# Patient Record
Sex: Female | Born: 1960 | Race: Black or African American | Hispanic: No | Smoking: Current every day smoker
Health system: Southern US, Community
[De-identification: ages and names within clinical notes are randomized; demographics above are authoritative.]

## PROBLEM LIST (undated history)

## (undated) DIAGNOSIS — I511 Rupture of chordae tendineae, not elsewhere classified: Secondary | ICD-10-CM

## (undated) DIAGNOSIS — R42 Dizziness and giddiness: Secondary | ICD-10-CM

## (undated) DIAGNOSIS — E119 Type 2 diabetes mellitus without complications: Secondary | ICD-10-CM

## (undated) DIAGNOSIS — H269 Unspecified cataract: Secondary | ICD-10-CM

## (undated) DIAGNOSIS — I10 Essential (primary) hypertension: Secondary | ICD-10-CM

## (undated) DIAGNOSIS — I209 Angina pectoris, unspecified: Secondary | ICD-10-CM

## (undated) DIAGNOSIS — I509 Heart failure, unspecified: Secondary | ICD-10-CM

## (undated) DIAGNOSIS — F32A Depression, unspecified: Secondary | ICD-10-CM

## (undated) DIAGNOSIS — I739 Peripheral vascular disease, unspecified: Secondary | ICD-10-CM

## (undated) DIAGNOSIS — Z9109 Other allergy status, other than to drugs and biological substances: Secondary | ICD-10-CM

## (undated) DIAGNOSIS — S88119A Complete traumatic amputation at level between knee and ankle, unspecified lower leg, initial encounter: Secondary | ICD-10-CM

## (undated) DIAGNOSIS — K635 Polyp of colon: Secondary | ICD-10-CM

## (undated) DIAGNOSIS — M0800 Unspecified juvenile rheumatoid arthritis of unspecified site: Secondary | ICD-10-CM

## (undated) DIAGNOSIS — G8929 Other chronic pain: Secondary | ICD-10-CM

## (undated) DIAGNOSIS — J189 Pneumonia, unspecified organism: Secondary | ICD-10-CM

## (undated) DIAGNOSIS — E785 Hyperlipidemia, unspecified: Secondary | ICD-10-CM

## (undated) DIAGNOSIS — M545 Other chronic pain: Secondary | ICD-10-CM

## (undated) DIAGNOSIS — M4800 Spinal stenosis, site unspecified: Secondary | ICD-10-CM

## (undated) DIAGNOSIS — J42 Unspecified chronic bronchitis: Secondary | ICD-10-CM

## (undated) DIAGNOSIS — E1142 Type 2 diabetes mellitus with diabetic polyneuropathy: Secondary | ICD-10-CM

## (undated) DIAGNOSIS — J449 Chronic obstructive pulmonary disease, unspecified: Secondary | ICD-10-CM

## (undated) DIAGNOSIS — M069 Rheumatoid arthritis, unspecified: Secondary | ICD-10-CM

## (undated) DIAGNOSIS — I330 Acute and subacute infective endocarditis: Secondary | ICD-10-CM

## (undated) DIAGNOSIS — J45909 Unspecified asthma, uncomplicated: Secondary | ICD-10-CM

## (undated) DIAGNOSIS — R0602 Shortness of breath: Secondary | ICD-10-CM

## (undated) DIAGNOSIS — K219 Gastro-esophageal reflux disease without esophagitis: Secondary | ICD-10-CM

## (undated) DIAGNOSIS — Z720 Tobacco use: Secondary | ICD-10-CM

## (undated) DIAGNOSIS — K760 Fatty (change of) liver, not elsewhere classified: Secondary | ICD-10-CM

## (undated) DIAGNOSIS — N189 Chronic kidney disease, unspecified: Secondary | ICD-10-CM

## (undated) DIAGNOSIS — R0600 Dyspnea, unspecified: Secondary | ICD-10-CM

## (undated) DIAGNOSIS — R519 Headache, unspecified: Secondary | ICD-10-CM

## (undated) DIAGNOSIS — M797 Fibromyalgia: Secondary | ICD-10-CM

## (undated) DIAGNOSIS — E1159 Type 2 diabetes mellitus with other circulatory complications: Secondary | ICD-10-CM

## (undated) DIAGNOSIS — Z9289 Personal history of other medical treatment: Secondary | ICD-10-CM

## (undated) DIAGNOSIS — G894 Chronic pain syndrome: Secondary | ICD-10-CM

## (undated) DIAGNOSIS — I82629 Acute embolism and thrombosis of deep veins of unspecified upper extremity: Secondary | ICD-10-CM

## (undated) DIAGNOSIS — I5032 Chronic diastolic (congestive) heart failure: Secondary | ICD-10-CM

## (undated) DIAGNOSIS — F329 Major depressive disorder, single episode, unspecified: Secondary | ICD-10-CM

## (undated) DIAGNOSIS — Z89519 Acquired absence of unspecified leg below knee: Secondary | ICD-10-CM

## (undated) DIAGNOSIS — A499 Bacterial infection, unspecified: Secondary | ICD-10-CM

## (undated) DIAGNOSIS — M47816 Spondylosis without myelopathy or radiculopathy, lumbar region: Secondary | ICD-10-CM

## (undated) DIAGNOSIS — L97509 Non-pressure chronic ulcer of other part of unspecified foot with unspecified severity: Secondary | ICD-10-CM

## (undated) DIAGNOSIS — M86679 Other chronic osteomyelitis, unspecified ankle and foot: Secondary | ICD-10-CM

## (undated) HISTORY — PX: TUBAL LIGATION: SHX77

## (undated) HISTORY — DX: Chronic obstructive pulmonary disease, unspecified: J44.9

## (undated) HISTORY — DX: Chronic pain syndrome: G89.4

## (undated) HISTORY — DX: Complete traumatic amputation at level between knee and ankle, unspecified lower leg, initial encounter: S88.119A

## (undated) HISTORY — PX: SHOULDER ARTHROSCOPY W/ ROTATOR CUFF REPAIR: SHX2400

## (undated) HISTORY — DX: Essential (primary) hypertension: I10

## (undated) HISTORY — PX: COLONOSCOPY: SHX174

## (undated) HISTORY — DX: Type 2 diabetes mellitus with other circulatory complications: E11.59

## (undated) HISTORY — DX: Acute and subacute infective endocarditis: I33.0

## (undated) HISTORY — DX: Gastro-esophageal reflux disease without esophagitis: K21.9

## (undated) HISTORY — DX: Acquired absence of unspecified leg below knee: Z89.519

## (undated) HISTORY — DX: Polyp of colon: K63.5

## (undated) HISTORY — PX: WRIST SURGERY: SHX841

## (undated) HISTORY — DX: Major depressive disorder, single episode, unspecified: F32.9

## (undated) HISTORY — DX: Unspecified juvenile rheumatoid arthritis of unspecified site: M08.00

## (undated) HISTORY — DX: Fibromyalgia: M79.7

## (undated) HISTORY — DX: Type 2 diabetes mellitus with diabetic polyneuropathy: E11.42

## (undated) HISTORY — DX: Chronic kidney disease, unspecified: N18.9

## (undated) HISTORY — PX: CATARACT EXTRACTION, BILATERAL: SHX1313

## (undated) HISTORY — DX: Peripheral vascular disease, unspecified: I73.9

## (undated) HISTORY — PX: GANGLION CYST EXCISION: SHX1691

## (undated) HISTORY — PX: BREAST BIOPSY: SHX20

## (undated) HISTORY — DX: Dizziness and giddiness: R42

## (undated) HISTORY — PX: TONSILLECTOMY: SUR1361

## (undated) HISTORY — DX: Hyperlipidemia, unspecified: E78.5

## (undated) HISTORY — PX: PERIPHERAL VASCULAR INTERVENTION: CATH118257

## (undated) HISTORY — DX: Depression, unspecified: F32.A

## (undated) HISTORY — PX: BLADDER SURGERY: SHX569

---

## 1983-12-23 HISTORY — PX: DILATION AND CURETTAGE OF UTERUS: SHX78

## 1995-12-23 HISTORY — PX: ABDOMINAL HYSTERECTOMY: SHX81

## 1998-07-30 ENCOUNTER — Encounter: Admission: RE | Admit: 1998-07-30 | Discharge: 1998-07-30 | Payer: Self-pay | Admitting: Internal Medicine

## 1998-08-22 ENCOUNTER — Encounter: Admission: RE | Admit: 1998-08-22 | Discharge: 1998-08-22 | Payer: Self-pay | Admitting: Internal Medicine

## 1998-12-03 ENCOUNTER — Emergency Department (HOSPITAL_COMMUNITY): Admission: EM | Admit: 1998-12-03 | Discharge: 1998-12-03 | Payer: Self-pay | Admitting: Emergency Medicine

## 1998-12-04 ENCOUNTER — Encounter: Payer: Self-pay | Admitting: Internal Medicine

## 1999-03-20 ENCOUNTER — Encounter: Admission: RE | Admit: 1999-03-20 | Discharge: 1999-03-20 | Payer: Self-pay | Admitting: Internal Medicine

## 1999-04-18 ENCOUNTER — Encounter: Admission: RE | Admit: 1999-04-18 | Discharge: 1999-04-18 | Payer: Self-pay | Admitting: Internal Medicine

## 1999-05-13 ENCOUNTER — Encounter: Admission: RE | Admit: 1999-05-13 | Discharge: 1999-05-13 | Payer: Self-pay | Admitting: Internal Medicine

## 1999-05-13 ENCOUNTER — Other Ambulatory Visit: Admission: RE | Admit: 1999-05-13 | Discharge: 1999-05-13 | Payer: Self-pay | Admitting: *Deleted

## 1999-11-07 ENCOUNTER — Encounter: Admission: RE | Admit: 1999-11-07 | Discharge: 1999-11-07 | Payer: Self-pay | Admitting: Internal Medicine

## 1999-12-03 ENCOUNTER — Encounter: Admission: RE | Admit: 1999-12-03 | Discharge: 1999-12-03 | Payer: Self-pay | Admitting: Internal Medicine

## 2000-03-31 ENCOUNTER — Encounter: Admission: RE | Admit: 2000-03-31 | Discharge: 2000-03-31 | Payer: Self-pay | Admitting: Hematology and Oncology

## 2000-04-14 ENCOUNTER — Ambulatory Visit (HOSPITAL_COMMUNITY): Admission: RE | Admit: 2000-04-14 | Discharge: 2000-04-14 | Payer: Self-pay | Admitting: *Deleted

## 2000-04-24 ENCOUNTER — Encounter: Admission: RE | Admit: 2000-04-24 | Discharge: 2000-04-24 | Payer: Self-pay | Admitting: *Deleted

## 2000-09-07 ENCOUNTER — Encounter: Admission: RE | Admit: 2000-09-07 | Discharge: 2000-09-07 | Payer: Self-pay | Admitting: Internal Medicine

## 2001-01-14 ENCOUNTER — Encounter: Admission: RE | Admit: 2001-01-14 | Discharge: 2001-01-14 | Payer: Self-pay | Admitting: Internal Medicine

## 2001-04-18 ENCOUNTER — Emergency Department (HOSPITAL_COMMUNITY): Admission: EM | Admit: 2001-04-18 | Discharge: 2001-04-18 | Payer: Self-pay | Admitting: Emergency Medicine

## 2001-08-17 ENCOUNTER — Ambulatory Visit (HOSPITAL_COMMUNITY): Admission: RE | Admit: 2001-08-17 | Discharge: 2001-08-17 | Payer: Self-pay

## 2001-08-17 ENCOUNTER — Encounter (INDEPENDENT_AMBULATORY_CARE_PROVIDER_SITE_OTHER): Payer: Self-pay | Admitting: Internal Medicine

## 2001-08-17 ENCOUNTER — Encounter: Admission: RE | Admit: 2001-08-17 | Discharge: 2001-08-17 | Payer: Self-pay | Admitting: Internal Medicine

## 2001-08-17 LAB — CONVERTED CEMR LAB: Pap Smear: NORMAL

## 2001-08-26 ENCOUNTER — Encounter: Admission: RE | Admit: 2001-08-26 | Discharge: 2001-11-24 | Payer: Self-pay

## 2001-12-22 DIAGNOSIS — K760 Fatty (change of) liver, not elsewhere classified: Secondary | ICD-10-CM

## 2001-12-22 HISTORY — DX: Fatty (change of) liver, not elsewhere classified: K76.0

## 2001-12-28 ENCOUNTER — Encounter: Admission: RE | Admit: 2001-12-28 | Discharge: 2001-12-28 | Payer: Self-pay

## 2002-09-04 ENCOUNTER — Emergency Department (HOSPITAL_COMMUNITY): Admission: EM | Admit: 2002-09-04 | Discharge: 2002-09-04 | Payer: Self-pay | Admitting: Emergency Medicine

## 2002-12-06 ENCOUNTER — Emergency Department (HOSPITAL_COMMUNITY): Admission: EM | Admit: 2002-12-06 | Discharge: 2002-12-06 | Payer: Self-pay | Admitting: Emergency Medicine

## 2002-12-07 ENCOUNTER — Ambulatory Visit (HOSPITAL_COMMUNITY): Admission: RE | Admit: 2002-12-07 | Discharge: 2002-12-07 | Payer: Self-pay | Admitting: *Deleted

## 2002-12-07 ENCOUNTER — Encounter: Payer: Self-pay | Admitting: *Deleted

## 2003-01-05 ENCOUNTER — Encounter: Admission: RE | Admit: 2003-01-05 | Discharge: 2003-01-05 | Payer: Self-pay | Admitting: Internal Medicine

## 2003-01-13 ENCOUNTER — Encounter: Payer: Self-pay | Admitting: Internal Medicine

## 2003-01-13 ENCOUNTER — Encounter: Admission: RE | Admit: 2003-01-13 | Discharge: 2003-01-13 | Payer: Self-pay | Admitting: Internal Medicine

## 2003-04-03 ENCOUNTER — Inpatient Hospital Stay (HOSPITAL_COMMUNITY): Admission: EM | Admit: 2003-04-03 | Discharge: 2003-04-05 | Payer: Self-pay | Admitting: Emergency Medicine

## 2003-04-03 ENCOUNTER — Encounter: Payer: Self-pay | Admitting: Emergency Medicine

## 2003-04-04 ENCOUNTER — Encounter: Payer: Self-pay | Admitting: Internal Medicine

## 2003-04-10 ENCOUNTER — Encounter: Admission: RE | Admit: 2003-04-10 | Discharge: 2003-04-10 | Payer: Self-pay | Admitting: Internal Medicine

## 2003-04-17 ENCOUNTER — Encounter: Admission: RE | Admit: 2003-04-17 | Discharge: 2003-04-17 | Payer: Self-pay | Admitting: Internal Medicine

## 2003-05-31 ENCOUNTER — Encounter: Admission: RE | Admit: 2003-05-31 | Discharge: 2003-05-31 | Payer: Self-pay | Admitting: Internal Medicine

## 2003-07-31 ENCOUNTER — Encounter: Admission: RE | Admit: 2003-07-31 | Discharge: 2003-07-31 | Payer: Self-pay | Admitting: Infectious Diseases

## 2003-07-31 ENCOUNTER — Ambulatory Visit (HOSPITAL_COMMUNITY): Admission: RE | Admit: 2003-07-31 | Discharge: 2003-07-31 | Payer: Self-pay | Admitting: Infectious Diseases

## 2003-10-06 ENCOUNTER — Encounter: Admission: RE | Admit: 2003-10-06 | Discharge: 2003-10-06 | Payer: Self-pay | Admitting: Internal Medicine

## 2004-01-18 ENCOUNTER — Emergency Department (HOSPITAL_COMMUNITY): Admission: EM | Admit: 2004-01-18 | Discharge: 2004-01-18 | Payer: Self-pay | Admitting: Emergency Medicine

## 2004-01-25 ENCOUNTER — Encounter: Admission: RE | Admit: 2004-01-25 | Discharge: 2004-01-25 | Payer: Self-pay | Admitting: Internal Medicine

## 2004-01-30 ENCOUNTER — Encounter: Admission: RE | Admit: 2004-01-30 | Discharge: 2004-01-30 | Payer: Self-pay | Admitting: Internal Medicine

## 2004-02-02 ENCOUNTER — Encounter: Admission: RE | Admit: 2004-02-02 | Discharge: 2004-02-02 | Payer: Self-pay | Admitting: Internal Medicine

## 2004-02-14 ENCOUNTER — Emergency Department (HOSPITAL_COMMUNITY): Admission: EM | Admit: 2004-02-14 | Discharge: 2004-02-14 | Payer: Self-pay | Admitting: Emergency Medicine

## 2004-04-16 ENCOUNTER — Emergency Department (HOSPITAL_COMMUNITY): Admission: EM | Admit: 2004-04-16 | Discharge: 2004-04-16 | Payer: Self-pay | Admitting: Emergency Medicine

## 2004-06-11 ENCOUNTER — Encounter: Admission: RE | Admit: 2004-06-11 | Discharge: 2004-06-11 | Payer: Self-pay | Admitting: Internal Medicine

## 2004-06-21 ENCOUNTER — Ambulatory Visit (HOSPITAL_COMMUNITY): Admission: RE | Admit: 2004-06-21 | Discharge: 2004-06-21 | Payer: Self-pay | Admitting: Internal Medicine

## 2004-06-22 ENCOUNTER — Ambulatory Visit (HOSPITAL_COMMUNITY): Admission: RE | Admit: 2004-06-22 | Discharge: 2004-06-22 | Payer: Self-pay | Admitting: Internal Medicine

## 2004-07-10 ENCOUNTER — Emergency Department (HOSPITAL_COMMUNITY): Admission: EM | Admit: 2004-07-10 | Discharge: 2004-07-10 | Payer: Self-pay | Admitting: Emergency Medicine

## 2004-07-22 ENCOUNTER — Encounter: Admission: RE | Admit: 2004-07-22 | Discharge: 2004-07-22 | Payer: Self-pay | Admitting: Internal Medicine

## 2004-07-22 ENCOUNTER — Ambulatory Visit (HOSPITAL_COMMUNITY): Admission: RE | Admit: 2004-07-22 | Discharge: 2004-07-22 | Payer: Self-pay | Admitting: Internal Medicine

## 2004-07-30 ENCOUNTER — Ambulatory Visit (HOSPITAL_COMMUNITY): Admission: RE | Admit: 2004-07-30 | Discharge: 2004-07-30 | Payer: Self-pay | Admitting: Internal Medicine

## 2004-08-05 ENCOUNTER — Encounter: Admission: RE | Admit: 2004-08-05 | Discharge: 2004-08-05 | Payer: Self-pay | Admitting: Internal Medicine

## 2004-08-12 ENCOUNTER — Encounter: Admission: RE | Admit: 2004-08-12 | Discharge: 2004-08-12 | Payer: Self-pay | Admitting: Internal Medicine

## 2004-08-28 ENCOUNTER — Inpatient Hospital Stay (HOSPITAL_COMMUNITY): Admission: RE | Admit: 2004-08-28 | Discharge: 2004-09-01 | Payer: Self-pay | Admitting: Orthopedic Surgery

## 2004-08-30 ENCOUNTER — Ambulatory Visit: Payer: Self-pay | Admitting: Internal Medicine

## 2004-10-11 ENCOUNTER — Emergency Department (HOSPITAL_COMMUNITY): Admission: EM | Admit: 2004-10-11 | Discharge: 2004-10-11 | Payer: Self-pay | Admitting: Emergency Medicine

## 2004-10-18 ENCOUNTER — Ambulatory Visit: Payer: Self-pay | Admitting: Internal Medicine

## 2004-11-05 ENCOUNTER — Ambulatory Visit: Payer: Self-pay | Admitting: Internal Medicine

## 2004-11-08 ENCOUNTER — Ambulatory Visit (HOSPITAL_COMMUNITY): Admission: RE | Admit: 2004-11-08 | Discharge: 2004-11-08 | Payer: Self-pay | Admitting: Internal Medicine

## 2004-11-13 ENCOUNTER — Ambulatory Visit (HOSPITAL_COMMUNITY): Admission: RE | Admit: 2004-11-13 | Discharge: 2004-11-13 | Payer: Self-pay | Admitting: Internal Medicine

## 2004-11-15 ENCOUNTER — Encounter: Payer: Self-pay | Admitting: Internal Medicine

## 2004-11-18 ENCOUNTER — Ambulatory Visit (HOSPITAL_COMMUNITY): Admission: RE | Admit: 2004-11-18 | Discharge: 2004-11-18 | Payer: Self-pay | Admitting: Obstetrics and Gynecology

## 2004-12-10 ENCOUNTER — Encounter: Payer: Self-pay | Admitting: Interventional Radiology

## 2005-02-06 ENCOUNTER — Ambulatory Visit: Payer: Self-pay | Admitting: Internal Medicine

## 2005-02-07 ENCOUNTER — Ambulatory Visit (HOSPITAL_COMMUNITY): Admission: RE | Admit: 2005-02-07 | Discharge: 2005-02-07 | Payer: Self-pay | Admitting: Internal Medicine

## 2005-02-10 ENCOUNTER — Ambulatory Visit: Payer: Self-pay | Admitting: Internal Medicine

## 2005-02-12 ENCOUNTER — Emergency Department (HOSPITAL_COMMUNITY): Admission: EM | Admit: 2005-02-12 | Discharge: 2005-02-12 | Payer: Self-pay | Admitting: Emergency Medicine

## 2005-02-28 ENCOUNTER — Ambulatory Visit: Payer: Self-pay | Admitting: Internal Medicine

## 2005-04-09 ENCOUNTER — Ambulatory Visit: Payer: Self-pay | Admitting: Internal Medicine

## 2005-04-14 ENCOUNTER — Ambulatory Visit: Payer: Self-pay | Admitting: Internal Medicine

## 2005-04-16 ENCOUNTER — Ambulatory Visit: Payer: Self-pay | Admitting: Internal Medicine

## 2005-04-24 ENCOUNTER — Ambulatory Visit: Payer: Self-pay | Admitting: Internal Medicine

## 2005-05-08 ENCOUNTER — Ambulatory Visit: Payer: Self-pay | Admitting: Internal Medicine

## 2005-05-16 ENCOUNTER — Ambulatory Visit: Payer: Self-pay | Admitting: Internal Medicine

## 2005-05-16 ENCOUNTER — Ambulatory Visit (HOSPITAL_COMMUNITY): Admission: RE | Admit: 2005-05-16 | Discharge: 2005-05-16 | Payer: Self-pay | Admitting: Internal Medicine

## 2005-05-21 ENCOUNTER — Ambulatory Visit: Payer: Self-pay | Admitting: Internal Medicine

## 2005-06-03 ENCOUNTER — Ambulatory Visit: Payer: Self-pay | Admitting: Internal Medicine

## 2005-07-08 ENCOUNTER — Inpatient Hospital Stay (HOSPITAL_COMMUNITY): Admission: AD | Admit: 2005-07-08 | Discharge: 2005-07-10 | Payer: Self-pay | Admitting: Internal Medicine

## 2005-07-08 ENCOUNTER — Ambulatory Visit: Payer: Self-pay | Admitting: Internal Medicine

## 2005-07-15 ENCOUNTER — Ambulatory Visit: Payer: Self-pay | Admitting: Internal Medicine

## 2005-07-29 ENCOUNTER — Ambulatory Visit: Payer: Self-pay | Admitting: Internal Medicine

## 2005-11-18 ENCOUNTER — Ambulatory Visit: Payer: Self-pay | Admitting: Internal Medicine

## 2005-11-21 ENCOUNTER — Ambulatory Visit: Payer: Self-pay | Admitting: Internal Medicine

## 2005-11-26 ENCOUNTER — Ambulatory Visit: Payer: Self-pay | Admitting: Internal Medicine

## 2006-02-23 ENCOUNTER — Ambulatory Visit: Payer: Self-pay | Admitting: Internal Medicine

## 2006-03-17 ENCOUNTER — Ambulatory Visit: Payer: Self-pay | Admitting: Internal Medicine

## 2006-05-05 ENCOUNTER — Inpatient Hospital Stay (HOSPITAL_COMMUNITY): Admission: AD | Admit: 2006-05-05 | Discharge: 2006-05-11 | Payer: Self-pay | Admitting: Internal Medicine

## 2006-05-05 ENCOUNTER — Ambulatory Visit: Payer: Self-pay | Admitting: Internal Medicine

## 2006-05-07 ENCOUNTER — Encounter (INDEPENDENT_AMBULATORY_CARE_PROVIDER_SITE_OTHER): Payer: Self-pay | Admitting: *Deleted

## 2006-06-30 ENCOUNTER — Ambulatory Visit: Payer: Self-pay | Admitting: Hospitalist

## 2006-09-21 ENCOUNTER — Ambulatory Visit: Payer: Self-pay | Admitting: Hospitalist

## 2006-09-29 ENCOUNTER — Ambulatory Visit: Payer: Self-pay | Admitting: Internal Medicine

## 2006-09-29 ENCOUNTER — Inpatient Hospital Stay (HOSPITAL_COMMUNITY): Admission: AD | Admit: 2006-09-29 | Discharge: 2006-10-07 | Payer: Self-pay | Admitting: Internal Medicine

## 2006-10-03 ENCOUNTER — Encounter (INDEPENDENT_AMBULATORY_CARE_PROVIDER_SITE_OTHER): Payer: Self-pay | Admitting: *Deleted

## 2006-10-05 ENCOUNTER — Encounter (INDEPENDENT_AMBULATORY_CARE_PROVIDER_SITE_OTHER): Payer: Self-pay | Admitting: *Deleted

## 2006-10-08 DIAGNOSIS — Z872 Personal history of diseases of the skin and subcutaneous tissue: Secondary | ICD-10-CM | POA: Insufficient documentation

## 2006-10-12 ENCOUNTER — Encounter (INDEPENDENT_AMBULATORY_CARE_PROVIDER_SITE_OTHER): Payer: Self-pay | Admitting: Internal Medicine

## 2006-10-12 ENCOUNTER — Ambulatory Visit: Payer: Self-pay | Admitting: Internal Medicine

## 2006-10-12 LAB — CONVERTED CEMR LAB
BUN: 20 mg/dL (ref 6–23)
CO2: 28 meq/L (ref 19–32)
Calcium: 9.2 mg/dL (ref 8.4–10.5)
Chloride: 100 meq/L (ref 96–112)
Creatinine, Ser: 1.4 mg/dL — ABNORMAL HIGH (ref 0.40–1.20)
Glucose, Bld: 353 mg/dL — ABNORMAL HIGH (ref 70–99)
Potassium: 3.7 meq/L (ref 3.5–5.3)
Sodium: 137 meq/L (ref 135–145)

## 2006-10-19 ENCOUNTER — Encounter (INDEPENDENT_AMBULATORY_CARE_PROVIDER_SITE_OTHER): Payer: Self-pay | Admitting: Internal Medicine

## 2006-10-19 ENCOUNTER — Ambulatory Visit: Payer: Self-pay | Admitting: Internal Medicine

## 2006-10-19 LAB — CONVERTED CEMR LAB
BUN: 12 mg/dL (ref 6–23)
CO2: 28 meq/L (ref 19–32)
Calcium: 9.7 mg/dL (ref 8.4–10.5)
Chloride: 109 meq/L (ref 96–112)
Creatinine, Ser: 0.9 mg/dL (ref 0.40–1.20)
Glucose, Bld: 50 mg/dL — ABNORMAL LOW (ref 70–99)
HCT: 37.7 % (ref 36.0–46.0)
Hemoglobin: 13 g/dL (ref 12.0–15.0)
Leukocyte count, blood: 12.7 10*9/L — ABNORMAL HIGH (ref 4.0–10.5)
MCHC: 34.4 g/dL (ref 30.0–36.0)
MCV: 96.1 fL (ref 78.0–100.0)
Platelets: 242 10*3/uL (ref 150–400)
Potassium: 3.1 meq/L — ABNORMAL LOW (ref 3.5–5.3)
RBC: 3.92 M/uL (ref 3.87–5.11)
RDW: 13.4 % (ref 11.5–14.0)
Sodium: 146 meq/L — ABNORMAL HIGH (ref 135–145)

## 2006-12-30 ENCOUNTER — Encounter (INDEPENDENT_AMBULATORY_CARE_PROVIDER_SITE_OTHER): Payer: Self-pay | Admitting: Internal Medicine

## 2006-12-30 ENCOUNTER — Ambulatory Visit: Payer: Self-pay | Admitting: Internal Medicine

## 2006-12-30 LAB — CONVERTED CEMR LAB
ALT: 25 units/L (ref 0–35)
AST: 15 units/L (ref 0–37)
Albumin: 4.3 g/dL (ref 3.5–5.2)
Alkaline Phosphatase: 106 units/L (ref 39–117)
BUN: 15 mg/dL (ref 6–23)
CO2: 23 meq/L (ref 19–32)
Calcium: 9.6 mg/dL (ref 8.4–10.5)
Chloride: 107 meq/L (ref 96–112)
Creatinine, Ser: 0.63 mg/dL (ref 0.40–1.20)
Glucose, Bld: 228 mg/dL — ABNORMAL HIGH (ref 70–99)
Potassium: 4.1 meq/L (ref 3.5–5.3)
Sodium: 140 meq/L (ref 135–145)
Total Bilirubin: 0.3 mg/dL (ref 0.3–1.2)
Total Protein: 7.4 g/dL (ref 6.0–8.3)

## 2007-01-08 ENCOUNTER — Encounter (INDEPENDENT_AMBULATORY_CARE_PROVIDER_SITE_OTHER): Payer: Self-pay | Admitting: Internal Medicine

## 2007-01-08 DIAGNOSIS — J4489 Other specified chronic obstructive pulmonary disease: Secondary | ICD-10-CM

## 2007-01-08 DIAGNOSIS — R7402 Elevation of levels of lactic acid dehydrogenase (LDH): Secondary | ICD-10-CM | POA: Insufficient documentation

## 2007-01-08 DIAGNOSIS — E1169 Type 2 diabetes mellitus with other specified complication: Secondary | ICD-10-CM | POA: Insufficient documentation

## 2007-01-08 DIAGNOSIS — R740 Nonspecific elevation of levels of transaminase and lactic acid dehydrogenase [LDH]: Secondary | ICD-10-CM

## 2007-01-08 DIAGNOSIS — E785 Hyperlipidemia, unspecified: Secondary | ICD-10-CM | POA: Insufficient documentation

## 2007-01-08 DIAGNOSIS — J449 Chronic obstructive pulmonary disease, unspecified: Secondary | ICD-10-CM

## 2007-01-08 DIAGNOSIS — J441 Chronic obstructive pulmonary disease with (acute) exacerbation: Secondary | ICD-10-CM | POA: Insufficient documentation

## 2007-01-08 DIAGNOSIS — R7401 Elevation of levels of liver transaminase levels: Secondary | ICD-10-CM | POA: Insufficient documentation

## 2007-01-08 HISTORY — DX: Chronic obstructive pulmonary disease, unspecified: J44.9

## 2007-01-08 HISTORY — DX: Other specified chronic obstructive pulmonary disease: J44.89

## 2007-01-20 ENCOUNTER — Encounter (INDEPENDENT_AMBULATORY_CARE_PROVIDER_SITE_OTHER): Payer: Self-pay | Admitting: Internal Medicine

## 2007-04-02 ENCOUNTER — Ambulatory Visit: Payer: Self-pay | Admitting: Internal Medicine

## 2007-04-02 ENCOUNTER — Encounter (INDEPENDENT_AMBULATORY_CARE_PROVIDER_SITE_OTHER): Payer: Self-pay | Admitting: Internal Medicine

## 2007-04-02 DIAGNOSIS — E119 Type 2 diabetes mellitus without complications: Secondary | ICD-10-CM | POA: Insufficient documentation

## 2007-04-02 DIAGNOSIS — E118 Type 2 diabetes mellitus with unspecified complications: Secondary | ICD-10-CM

## 2007-04-02 DIAGNOSIS — E1151 Type 2 diabetes mellitus with diabetic peripheral angiopathy without gangrene: Secondary | ICD-10-CM

## 2007-04-02 DIAGNOSIS — E11621 Type 2 diabetes mellitus with foot ulcer: Secondary | ICD-10-CM | POA: Insufficient documentation

## 2007-04-02 DIAGNOSIS — E1165 Type 2 diabetes mellitus with hyperglycemia: Secondary | ICD-10-CM | POA: Insufficient documentation

## 2007-04-02 HISTORY — DX: Type 2 diabetes mellitus with unspecified complications: E11.8

## 2007-04-02 LAB — CONVERTED CEMR LAB
Blood Glucose, Fingerstick: 156
Hgb A1c MFr Bld: 8.4 %

## 2007-04-04 LAB — CONVERTED CEMR LAB
ALT: 18 units/L (ref 0–35)
AST: 15 units/L (ref 0–37)
Albumin: 3.9 g/dL (ref 3.5–5.2)
Alkaline Phosphatase: 91 units/L (ref 39–117)
BUN: 12 mg/dL (ref 6–23)
CO2: 24 meq/L (ref 19–32)
Calcium: 9.1 mg/dL (ref 8.4–10.5)
Chloride: 107 meq/L (ref 96–112)
Cholesterol: 184 mg/dL (ref 0–200)
Creatinine, Ser: 0.6 mg/dL (ref 0.40–1.20)
Glucose, Bld: 145 mg/dL — ABNORMAL HIGH (ref 70–99)
HDL: 46 mg/dL (ref >39)
LDL Cholesterol: 126 mg/dL — ABNORMAL HIGH (ref 0–99)
Potassium: 4.1 meq/L (ref 3.5–5.3)
Sodium: 142 meq/L (ref 135–145)
Total Bilirubin: 0.4 mg/dL (ref 0.3–1.2)
Total CHOL/HDL Ratio: 4
Total Protein: 6.6 g/dL (ref 6.0–8.3)
Triglycerides: 61 mg/dL (ref <150)
VLDL: 12 mg/dL (ref 0–40)

## 2007-04-13 ENCOUNTER — Emergency Department (HOSPITAL_COMMUNITY): Admission: EM | Admit: 2007-04-13 | Discharge: 2007-04-13 | Payer: Self-pay | Admitting: Emergency Medicine

## 2007-04-13 ENCOUNTER — Telehealth: Payer: Self-pay | Admitting: *Deleted

## 2007-04-19 ENCOUNTER — Ambulatory Visit (HOSPITAL_COMMUNITY): Admission: RE | Admit: 2007-04-19 | Discharge: 2007-04-19 | Payer: Self-pay | Admitting: Hospitalist

## 2007-04-19 ENCOUNTER — Encounter (INDEPENDENT_AMBULATORY_CARE_PROVIDER_SITE_OTHER): Payer: Self-pay | Admitting: Internal Medicine

## 2007-04-19 ENCOUNTER — Ambulatory Visit: Payer: Self-pay | Admitting: Internal Medicine

## 2007-04-19 DIAGNOSIS — G8929 Other chronic pain: Secondary | ICD-10-CM | POA: Insufficient documentation

## 2007-04-19 DIAGNOSIS — M549 Dorsalgia, unspecified: Secondary | ICD-10-CM

## 2007-04-19 LAB — CONVERTED CEMR LAB
Bilirubin Urine: NEGATIVE
Blood Glucose, Fingerstick: 215
Hemoglobin, Urine: NEGATIVE
Ketones, ur: NEGATIVE mg/dL
Leukocytes, UA: NEGATIVE
Nitrite: NEGATIVE
Protein, ur: NEGATIVE mg/dL
RBC / HPF: NONE SEEN (ref <3)
Specific Gravity, Urine: 1.029 (ref 1.005–1.03)
Urine Glucose: >1000 mg/dL — AB
Urobilinogen, UA: 0.2 (ref 0.0–1.0)
WBC, UA: NONE SEEN cells/hpf (ref <3)
pH: 6 (ref 5.0–8.0)

## 2007-04-20 ENCOUNTER — Telehealth (INDEPENDENT_AMBULATORY_CARE_PROVIDER_SITE_OTHER): Payer: Self-pay | Admitting: *Deleted

## 2007-05-12 ENCOUNTER — Ambulatory Visit: Payer: Self-pay | Admitting: Hospitalist

## 2007-05-12 ENCOUNTER — Encounter (INDEPENDENT_AMBULATORY_CARE_PROVIDER_SITE_OTHER): Payer: Self-pay | Admitting: *Deleted

## 2007-05-12 LAB — CONVERTED CEMR LAB: Blood Glucose, Fingerstick: 104

## 2007-05-13 LAB — CONVERTED CEMR LAB
Basophils Absolute: 0.1 10*3/uL (ref 0.0–0.1)
Basophils Relative: 0 % (ref 0–1)
Eosinophils Absolute: 0.1 10*3/uL (ref 0.0–0.7)
Eosinophils Relative: 1 % (ref 0–5)
HCT: 42.8 % (ref 36.0–46.0)
Hemoglobin: 14.8 g/dL (ref 12.0–15.0)
Lymphocytes Relative: 39 % (ref 12–46)
Lymphs Abs: 5.7 10*3/uL — ABNORMAL HIGH (ref 0.7–3.3)
MCHC: 34.6 g/dL (ref 30.0–36.0)
MCV: 94.1 fL (ref 78.0–100.0)
Monocytes Absolute: 1 10*3/uL — ABNORMAL HIGH (ref 0.2–0.7)
Monocytes Relative: 7 % (ref 3–11)
Neutro Abs: 7.8 10*3/uL — ABNORMAL HIGH (ref 1.7–7.7)
Neutrophils Relative %: 53 % (ref 43–77)
Platelets: 187 10*3/uL (ref 150–400)
RBC: 4.55 M/uL (ref 3.87–5.11)
RDW: 13.1 % (ref 11.5–14.0)
WBC: 14.7 10*3/uL — ABNORMAL HIGH (ref 4.0–10.5)

## 2007-05-24 ENCOUNTER — Encounter (INDEPENDENT_AMBULATORY_CARE_PROVIDER_SITE_OTHER): Payer: Self-pay | Admitting: Internal Medicine

## 2007-05-24 ENCOUNTER — Ambulatory Visit: Payer: Self-pay | Admitting: Internal Medicine

## 2007-05-24 LAB — CONVERTED CEMR LAB
BUN: 15 mg/dL (ref 6–23)
Blood Glucose, Fingerstick: 193
CO2: 25 meq/L (ref 19–32)
Calcium: 9.4 mg/dL (ref 8.4–10.5)
Chloride: 104 meq/L (ref 96–112)
Creatinine, Ser: 0.59 mg/dL (ref 0.40–1.20)
Glucose, Bld: 182 mg/dL — ABNORMAL HIGH (ref 70–99)
Potassium: 4.1 meq/L (ref 3.5–5.3)
Sodium: 139 meq/L (ref 135–145)

## 2007-05-31 ENCOUNTER — Encounter (INDEPENDENT_AMBULATORY_CARE_PROVIDER_SITE_OTHER): Payer: Self-pay | Admitting: Internal Medicine

## 2007-05-31 ENCOUNTER — Ambulatory Visit (HOSPITAL_COMMUNITY): Admission: RE | Admit: 2007-05-31 | Discharge: 2007-05-31 | Payer: Self-pay | Admitting: Internal Medicine

## 2007-06-14 ENCOUNTER — Ambulatory Visit: Payer: Self-pay | Admitting: Internal Medicine

## 2007-06-14 LAB — CONVERTED CEMR LAB: Blood Glucose, Fingerstick: 210

## 2007-06-22 ENCOUNTER — Encounter (INDEPENDENT_AMBULATORY_CARE_PROVIDER_SITE_OTHER): Payer: Self-pay | Admitting: Internal Medicine

## 2007-08-11 ENCOUNTER — Ambulatory Visit: Payer: Self-pay | Admitting: Internal Medicine

## 2007-08-11 ENCOUNTER — Telehealth: Payer: Self-pay | Admitting: *Deleted

## 2007-08-11 ENCOUNTER — Encounter (INDEPENDENT_AMBULATORY_CARE_PROVIDER_SITE_OTHER): Payer: Self-pay | Admitting: Internal Medicine

## 2007-08-11 DIAGNOSIS — G510 Bell's palsy: Secondary | ICD-10-CM | POA: Insufficient documentation

## 2007-08-11 LAB — CONVERTED CEMR LAB
ALT: 23 units/L (ref 0–35)
AST: 18 units/L (ref 0–37)
Albumin: 3.8 g/dL (ref 3.5–5.2)
Alkaline Phosphatase: 88 units/L (ref 39–117)
BUN: 15 mg/dL (ref 6–23)
CO2: 24 meq/L (ref 19–32)
Calcium: 8.8 mg/dL (ref 8.4–10.5)
Chloride: 107 meq/L (ref 96–112)
Creatinine, Ser: 0.8 mg/dL (ref 0.40–1.20)
Glucose, Bld: 258 mg/dL — ABNORMAL HIGH (ref 70–99)
Potassium: 3.9 meq/L (ref 3.5–5.3)
Sodium: 142 meq/L (ref 135–145)
Total Bilirubin: 0.3 mg/dL (ref 0.3–1.2)
Total Protein: 6.4 g/dL (ref 6.0–8.3)

## 2007-08-17 ENCOUNTER — Ambulatory Visit: Payer: Self-pay | Admitting: Internal Medicine

## 2007-08-17 LAB — CONVERTED CEMR LAB
Blood Glucose, Fingerstick: 102
Hgb A1c MFr Bld: 8.2 %

## 2007-08-18 ENCOUNTER — Ambulatory Visit (HOSPITAL_COMMUNITY): Admission: RE | Admit: 2007-08-18 | Discharge: 2007-08-18 | Payer: Self-pay | Admitting: Internal Medicine

## 2007-09-13 ENCOUNTER — Encounter (INDEPENDENT_AMBULATORY_CARE_PROVIDER_SITE_OTHER): Payer: Self-pay | Admitting: Internal Medicine

## 2007-09-13 ENCOUNTER — Ambulatory Visit: Payer: Self-pay | Admitting: Internal Medicine

## 2007-09-13 DIAGNOSIS — I739 Peripheral vascular disease, unspecified: Secondary | ICD-10-CM | POA: Insufficient documentation

## 2007-09-13 LAB — CONVERTED CEMR LAB
ALT: 27 units/L (ref 0–35)
AST: 20 units/L (ref 0–37)
Albumin: 3.9 g/dL (ref 3.5–5.2)
Alkaline Phosphatase: 81 units/L (ref 39–117)
Anti Nuclear Antibody(ANA): NEGATIVE
BUN: 13 mg/dL (ref 6–23)
Blood Glucose, Fingerstick: 101
CO2: 23 meq/L (ref 19–32)
Calcium: 9.1 mg/dL (ref 8.4–10.5)
Chloride: 109 meq/L (ref 96–112)
Creatinine, Ser: 0.61 mg/dL (ref 0.40–1.20)
Glucose, Bld: 59 mg/dL — ABNORMAL LOW (ref 70–99)
HCT: 42 % (ref 36.0–46.0)
Hemoglobin: 14.1 g/dL (ref 12.0–15.0)
MCHC: 33.6 g/dL (ref 30.0–36.0)
MCV: 97 fL (ref 78.0–100.0)
Neutrophil cytoplasmic antibodies,IgG,serum: <1:20 {titer}
Platelets: 163 10*3/uL (ref 150–400)
Potassium: 3.7 meq/L (ref 3.5–5.3)
RBC: 4.33 M/uL (ref 3.87–5.11)
RDW: 13.9 % (ref 11.5–14.0)
Sed Rate: 32 mm/hr — ABNORMAL HIGH (ref 0–22)
Sodium: 144 meq/L (ref 135–145)
Total Bilirubin: 0.2 mg/dL — ABNORMAL LOW (ref 0.3–1.2)
Total Protein: 6.7 g/dL (ref 6.0–8.3)
WBC: 11.3 10*3/uL — ABNORMAL HIGH (ref 4.0–10.5)

## 2007-09-14 ENCOUNTER — Encounter: Payer: Self-pay | Admitting: Internal Medicine

## 2007-09-14 ENCOUNTER — Ambulatory Visit: Payer: Self-pay | Admitting: Vascular Surgery

## 2007-09-14 ENCOUNTER — Ambulatory Visit (HOSPITAL_COMMUNITY): Admission: RE | Admit: 2007-09-14 | Discharge: 2007-09-14 | Payer: Self-pay | Admitting: Internal Medicine

## 2007-09-20 ENCOUNTER — Ambulatory Visit (HOSPITAL_COMMUNITY): Admission: RE | Admit: 2007-09-20 | Discharge: 2007-09-20 | Payer: Self-pay | Admitting: Internal Medicine

## 2007-09-20 ENCOUNTER — Encounter (INDEPENDENT_AMBULATORY_CARE_PROVIDER_SITE_OTHER): Payer: Self-pay | Admitting: Internal Medicine

## 2007-09-20 ENCOUNTER — Ambulatory Visit: Payer: Self-pay | Admitting: Internal Medicine

## 2007-09-22 ENCOUNTER — Telehealth: Payer: Self-pay | Admitting: *Deleted

## 2007-09-22 ENCOUNTER — Emergency Department (HOSPITAL_COMMUNITY): Admission: EM | Admit: 2007-09-22 | Discharge: 2007-09-22 | Payer: Self-pay | Admitting: Emergency Medicine

## 2007-09-23 LAB — CONVERTED CEMR LAB
Cholesterol: 165 mg/dL (ref 0–200)
HDL: 45 mg/dL (ref >39)
LDL Cholesterol: 110 mg/dL — ABNORMAL HIGH (ref 0–99)
Total CHOL/HDL Ratio: 3.7
Triglycerides: 52 mg/dL (ref <150)
VLDL: 10 mg/dL (ref 0–40)

## 2007-09-24 ENCOUNTER — Encounter (INDEPENDENT_AMBULATORY_CARE_PROVIDER_SITE_OTHER): Payer: Self-pay | Admitting: Internal Medicine

## 2007-09-29 ENCOUNTER — Ambulatory Visit: Payer: Self-pay | Admitting: Internal Medicine

## 2007-09-29 ENCOUNTER — Encounter (INDEPENDENT_AMBULATORY_CARE_PROVIDER_SITE_OTHER): Payer: Self-pay | Admitting: Internal Medicine

## 2007-09-29 LAB — CONVERTED CEMR LAB
Blood Glucose, Fingerstick: 126
Creatinine, Urine: 118.6 mg/dL
Microalb Creat Ratio: 1.7 mg/g (ref 0.0–30.0)
Microalb, Ur: 0.2 mg/dL (ref 0.00–1.89)

## 2007-10-20 ENCOUNTER — Ambulatory Visit: Payer: Self-pay | Admitting: Vascular Surgery

## 2007-10-20 ENCOUNTER — Encounter (INDEPENDENT_AMBULATORY_CARE_PROVIDER_SITE_OTHER): Payer: Self-pay | Admitting: Internal Medicine

## 2007-10-22 ENCOUNTER — Ambulatory Visit: Payer: Self-pay | Admitting: Vascular Surgery

## 2007-10-22 ENCOUNTER — Observation Stay (HOSPITAL_COMMUNITY): Admission: RE | Admit: 2007-10-22 | Discharge: 2007-10-23 | Payer: Self-pay | Admitting: Vascular Surgery

## 2007-10-22 ENCOUNTER — Encounter (INDEPENDENT_AMBULATORY_CARE_PROVIDER_SITE_OTHER): Payer: Self-pay | Admitting: Internal Medicine

## 2007-11-04 ENCOUNTER — Ambulatory Visit: Payer: Self-pay | Admitting: Internal Medicine

## 2007-11-04 ENCOUNTER — Encounter (INDEPENDENT_AMBULATORY_CARE_PROVIDER_SITE_OTHER): Payer: Self-pay | Admitting: Internal Medicine

## 2007-11-04 LAB — CONVERTED CEMR LAB
BUN: 18 mg/dL (ref 6–23)
Blood Glucose, Fingerstick: 95
CO2: 23 meq/L (ref 19–32)
Calcium: 9.4 mg/dL (ref 8.4–10.5)
Chloride: 107 meq/L (ref 96–112)
Creatinine, Ser: 0.7 mg/dL (ref 0.40–1.20)
Glucose, Bld: 55 mg/dL — ABNORMAL LOW (ref 70–99)
Hgb A1c MFr Bld: 7.9 %
Potassium: 3.9 meq/L (ref 3.5–5.3)
Sodium: 142 meq/L (ref 135–145)

## 2007-12-02 ENCOUNTER — Telehealth: Payer: Self-pay | Admitting: *Deleted

## 2008-01-24 ENCOUNTER — Encounter (INDEPENDENT_AMBULATORY_CARE_PROVIDER_SITE_OTHER): Payer: Self-pay | Admitting: Internal Medicine

## 2008-01-24 ENCOUNTER — Ambulatory Visit: Payer: Self-pay | Admitting: Hospitalist

## 2008-01-24 LAB — CONVERTED CEMR LAB
ALT: 23 U/L
AST: 15 U/L
Albumin: 4 g/dL
Alkaline Phosphatase: 86 U/L
BUN: 19 mg/dL
CO2: 22 meq/L
Calcium: 9.5 mg/dL
Chloride: 104 meq/L
Creatinine, Ser: 0.75 mg/dL
Glucose, Bld: 283 mg/dL — ABNORMAL HIGH
Potassium: 3.9 meq/L
Sodium: 140 meq/L
Total Bilirubin: 0.2 mg/dL — ABNORMAL LOW
Total Protein: 6.6 g/dL

## 2008-02-04 ENCOUNTER — Ambulatory Visit: Payer: Self-pay | Admitting: Physical Medicine & Rehabilitation

## 2008-02-04 ENCOUNTER — Encounter
Admission: RE | Admit: 2008-02-04 | Discharge: 2008-05-04 | Payer: Self-pay | Admitting: Physical Medicine & Rehabilitation

## 2008-02-18 ENCOUNTER — Telehealth (INDEPENDENT_AMBULATORY_CARE_PROVIDER_SITE_OTHER): Payer: Self-pay | Admitting: Internal Medicine

## 2008-02-24 ENCOUNTER — Telehealth (INDEPENDENT_AMBULATORY_CARE_PROVIDER_SITE_OTHER): Payer: Self-pay | Admitting: Internal Medicine

## 2008-03-01 ENCOUNTER — Ambulatory Visit: Payer: Self-pay | Admitting: Infectious Diseases

## 2008-03-01 ENCOUNTER — Encounter (INDEPENDENT_AMBULATORY_CARE_PROVIDER_SITE_OTHER): Payer: Self-pay | Admitting: Internal Medicine

## 2008-03-01 LAB — CONVERTED CEMR LAB
ALT: 24 units/L (ref 0–35)
AST: 19 units/L (ref 0–37)
Albumin: 3.9 g/dL (ref 3.5–5.2)
Alkaline Phosphatase: 87 units/L (ref 39–117)
BUN: 13 mg/dL (ref 6–23)
Blood Glucose, Fingerstick: 261
CO2: 22 meq/L (ref 19–32)
Calcium: 9 mg/dL (ref 8.4–10.5)
Chloride: 105 meq/L (ref 96–112)
Creatinine, Ser: 0.73 mg/dL (ref 0.40–1.20)
Glucose, Bld: 231 mg/dL — ABNORMAL HIGH (ref 70–99)
Hgb A1c MFr Bld: 9.5 %
Potassium: 4.1 meq/L (ref 3.5–5.3)
Sodium: 142 meq/L (ref 135–145)
Total Bilirubin: 0.3 mg/dL (ref 0.3–1.2)
Total Protein: 6.7 g/dL (ref 6.0–8.3)

## 2008-03-08 ENCOUNTER — Telehealth: Payer: Self-pay | Admitting: *Deleted

## 2008-03-08 ENCOUNTER — Telehealth (INDEPENDENT_AMBULATORY_CARE_PROVIDER_SITE_OTHER): Payer: Self-pay | Admitting: Internal Medicine

## 2008-03-09 ENCOUNTER — Telehealth: Payer: Self-pay | Admitting: *Deleted

## 2008-03-17 ENCOUNTER — Ambulatory Visit: Payer: Self-pay | Admitting: Physical Medicine & Rehabilitation

## 2008-03-30 ENCOUNTER — Encounter (INDEPENDENT_AMBULATORY_CARE_PROVIDER_SITE_OTHER): Payer: Self-pay | Admitting: *Deleted

## 2008-03-30 ENCOUNTER — Ambulatory Visit: Payer: Self-pay | Admitting: *Deleted

## 2008-03-30 ENCOUNTER — Encounter: Admission: RE | Admit: 2008-03-30 | Discharge: 2008-03-30 | Payer: Self-pay | Admitting: Infectious Disease

## 2008-03-30 ENCOUNTER — Inpatient Hospital Stay (HOSPITAL_COMMUNITY): Admission: AD | Admit: 2008-03-30 | Discharge: 2008-03-31 | Payer: Self-pay | Admitting: *Deleted

## 2008-03-30 ENCOUNTER — Ambulatory Visit: Payer: Self-pay | Admitting: Infectious Disease

## 2008-03-31 ENCOUNTER — Ambulatory Visit: Payer: Self-pay | Admitting: Vascular Surgery

## 2008-04-06 ENCOUNTER — Ambulatory Visit: Payer: Self-pay | Admitting: Infectious Disease

## 2008-04-06 DIAGNOSIS — F332 Major depressive disorder, recurrent severe without psychotic features: Secondary | ICD-10-CM | POA: Insufficient documentation

## 2008-04-06 DIAGNOSIS — F324 Major depressive disorder, single episode, in partial remission: Secondary | ICD-10-CM | POA: Insufficient documentation

## 2008-04-10 ENCOUNTER — Encounter (HOSPITAL_BASED_OUTPATIENT_CLINIC_OR_DEPARTMENT_OTHER): Admission: RE | Admit: 2008-04-10 | Discharge: 2008-06-02 | Payer: Self-pay | Admitting: Surgery

## 2008-04-11 ENCOUNTER — Ambulatory Visit (HOSPITAL_COMMUNITY): Admission: RE | Admit: 2008-04-11 | Discharge: 2008-04-11 | Payer: Self-pay | Admitting: Internal Medicine

## 2008-04-12 ENCOUNTER — Encounter (INDEPENDENT_AMBULATORY_CARE_PROVIDER_SITE_OTHER): Payer: Self-pay | Admitting: Internal Medicine

## 2008-04-13 ENCOUNTER — Encounter: Payer: Self-pay | Admitting: Licensed Clinical Social Worker

## 2008-04-25 ENCOUNTER — Encounter: Admission: RE | Admit: 2008-04-25 | Discharge: 2008-06-02 | Payer: Self-pay | Admitting: Internal Medicine

## 2008-04-26 ENCOUNTER — Encounter: Payer: Self-pay | Admitting: Licensed Clinical Social Worker

## 2008-05-09 ENCOUNTER — Ambulatory Visit: Payer: Self-pay | Admitting: Internal Medicine

## 2008-05-12 ENCOUNTER — Encounter (INDEPENDENT_AMBULATORY_CARE_PROVIDER_SITE_OTHER): Payer: Self-pay | Admitting: Internal Medicine

## 2008-05-19 ENCOUNTER — Ambulatory Visit: Payer: Self-pay | Admitting: Vascular Surgery

## 2008-05-26 ENCOUNTER — Ambulatory Visit: Payer: Self-pay | Admitting: Internal Medicine

## 2008-05-26 ENCOUNTER — Encounter (INDEPENDENT_AMBULATORY_CARE_PROVIDER_SITE_OTHER): Payer: Self-pay | Admitting: Internal Medicine

## 2008-05-26 LAB — CONVERTED CEMR LAB
Amphetamine Screen, Ur: NEGATIVE
Barbiturate Quant, Ur: NEGATIVE
Benzodiazepines.: NEGATIVE
Cocaine Metabolites: NEGATIVE
Creatinine,U: 200.3 mg/dL
Hgb A1c MFr Bld: 9.2 %
Marijuana Metabolite: POSITIVE — AB
Methadone: NEGATIVE
Opiates: NEGATIVE
Phencyclidine (PCP): NEGATIVE
Propoxyphene: NEGATIVE

## 2008-05-31 ENCOUNTER — Encounter: Payer: Self-pay | Admitting: Internal Medicine

## 2008-06-22 ENCOUNTER — Encounter: Payer: Self-pay | Admitting: Internal Medicine

## 2008-06-22 ENCOUNTER — Ambulatory Visit: Payer: Self-pay | Admitting: *Deleted

## 2008-06-22 DIAGNOSIS — K1370 Unspecified lesions of oral mucosa: Secondary | ICD-10-CM | POA: Insufficient documentation

## 2008-06-22 LAB — CONVERTED CEMR LAB
Blood Glucose, Fingerstick: 332
Cholesterol: 183 mg/dL (ref 0–200)
HDL: 31 mg/dL — ABNORMAL LOW (ref >39)
LDL Cholesterol: 122 mg/dL — ABNORMAL HIGH (ref 0–99)
Total CHOL/HDL Ratio: 5.9
Triglycerides: 150 mg/dL — ABNORMAL HIGH (ref <150)
VLDL: 30 mg/dL (ref 0–40)

## 2008-08-01 ENCOUNTER — Encounter: Payer: Self-pay | Admitting: Internal Medicine

## 2008-08-31 ENCOUNTER — Telehealth (INDEPENDENT_AMBULATORY_CARE_PROVIDER_SITE_OTHER): Payer: Self-pay | Admitting: Pharmacy Technician

## 2008-09-26 ENCOUNTER — Encounter: Payer: Self-pay | Admitting: Internal Medicine

## 2008-09-26 ENCOUNTER — Ambulatory Visit: Payer: Self-pay | Admitting: Internal Medicine

## 2008-09-26 DIAGNOSIS — L97509 Non-pressure chronic ulcer of other part of unspecified foot with unspecified severity: Secondary | ICD-10-CM | POA: Insufficient documentation

## 2008-09-26 LAB — CONVERTED CEMR LAB
BUN: 11 mg/dL (ref 6–23)
Blood Glucose, Fingerstick: 66
CO2: 26 meq/L (ref 19–32)
Calcium: 9.3 mg/dL (ref 8.4–10.5)
Chloride: 105 meq/L (ref 96–112)
Creatinine, Ser: 0.71 mg/dL (ref 0.40–1.20)
Glucose, Bld: 68 mg/dL — ABNORMAL LOW (ref 70–99)
Hgb A1c MFr Bld: 9.4 %
Potassium: 3.7 meq/L (ref 3.5–5.3)
Sodium: 142 meq/L (ref 135–145)

## 2008-10-26 ENCOUNTER — Encounter: Payer: Self-pay | Admitting: Internal Medicine

## 2008-10-30 ENCOUNTER — Telehealth: Payer: Self-pay | Admitting: Internal Medicine

## 2008-11-08 ENCOUNTER — Encounter: Payer: Self-pay | Admitting: Internal Medicine

## 2008-11-08 ENCOUNTER — Ambulatory Visit: Payer: Self-pay | Admitting: Infectious Diseases

## 2008-11-08 LAB — CONVERTED CEMR LAB
BUN: 11 mg/dL (ref 6–23)
Blood Glucose, Fingerstick: 197
CO2: 26 meq/L (ref 19–32)
Calcium: 9.1 mg/dL (ref 8.4–10.5)
Chloride: 104 meq/L (ref 96–112)
Creatinine, Ser: 0.67 mg/dL (ref 0.40–1.20)
Glucose, Bld: 107 mg/dL — ABNORMAL HIGH (ref 70–99)
HCT: 43.3 % (ref 36.0–46.0)
Hemoglobin: 14 g/dL (ref 12.0–15.0)
MCHC: 32.3 g/dL (ref 30.0–36.0)
MCV: 96.9 fL (ref 78.0–100.0)
Platelets: 180 10*3/uL (ref 150–400)
Potassium: 3.7 meq/L (ref 3.5–5.3)
RBC: 4.47 M/uL (ref 3.87–5.11)
RDW: 14.1 % (ref 11.5–15.5)
Sodium: 144 meq/L (ref 135–145)
WBC: 9.3 10*3/uL (ref 4.0–10.5)

## 2008-11-14 ENCOUNTER — Encounter: Payer: Self-pay | Admitting: Internal Medicine

## 2008-11-24 ENCOUNTER — Encounter: Payer: Self-pay | Admitting: Internal Medicine

## 2008-11-24 ENCOUNTER — Ambulatory Visit: Payer: Self-pay | Admitting: Internal Medicine

## 2008-11-24 ENCOUNTER — Ambulatory Visit (HOSPITAL_COMMUNITY): Admission: RE | Admit: 2008-11-24 | Discharge: 2008-11-24 | Payer: Self-pay | Admitting: Internal Medicine

## 2008-11-24 DIAGNOSIS — R634 Abnormal weight loss: Secondary | ICD-10-CM | POA: Insufficient documentation

## 2008-11-24 DIAGNOSIS — R1310 Dysphagia, unspecified: Secondary | ICD-10-CM | POA: Insufficient documentation

## 2008-11-24 DIAGNOSIS — R112 Nausea with vomiting, unspecified: Secondary | ICD-10-CM

## 2008-11-24 DIAGNOSIS — R110 Nausea: Secondary | ICD-10-CM | POA: Insufficient documentation

## 2008-11-24 DIAGNOSIS — K219 Gastro-esophageal reflux disease without esophagitis: Secondary | ICD-10-CM | POA: Insufficient documentation

## 2008-11-24 HISTORY — DX: Nausea with vomiting, unspecified: R11.2

## 2008-11-27 ENCOUNTER — Encounter: Payer: Self-pay | Admitting: Internal Medicine

## 2008-11-29 LAB — CONVERTED CEMR LAB
ALT: 55 units/L — ABNORMAL HIGH (ref 0–35)
AST: 40 units/L — ABNORMAL HIGH (ref 0–37)
Albumin: 3.6 g/dL (ref 3.5–5.2)
Alkaline Phosphatase: 106 units/L (ref 39–117)
BUN: 11 mg/dL (ref 6–23)
CO2: 28 meq/L (ref 19–32)
Calcium: 9.3 mg/dL (ref 8.4–10.5)
Chloride: 100 meq/L (ref 96–112)
Cholesterol: 171 mg/dL (ref 0–200)
Creatinine, Ser: 0.74 mg/dL (ref 0.40–1.20)
Glucose, Bld: 197 mg/dL — ABNORMAL HIGH (ref 70–99)
HCT: 47 % — ABNORMAL HIGH (ref 36.0–46.0)
HDL: 30 mg/dL — ABNORMAL LOW (ref >39)
Hemoglobin: 15.8 g/dL — ABNORMAL HIGH (ref 12.0–15.0)
LDL Cholesterol: 79 mg/dL (ref 0–99)
Lipase: 57 units/L (ref 11–59)
MCHC: 33.5 g/dL (ref 30.0–36.0)
MCV: 96.2 fL (ref 78.0–100.0)
Platelets: 178 10*3/uL (ref 150–400)
Potassium: 3.4 meq/L — ABNORMAL LOW (ref 3.5–5.3)
RBC: 4.88 M/uL (ref 3.87–5.11)
RDW: 13.6 % (ref 11.5–15.5)
Sodium: 135 meq/L (ref 135–145)
Total Bilirubin: 0.3 mg/dL (ref 0.3–1.2)
Total CHOL/HDL Ratio: 5.7
Total Protein: 6.8 g/dL (ref 6.0–8.3)
Triglycerides: 310 mg/dL — ABNORMAL HIGH (ref <150)
VLDL: 62 mg/dL — ABNORMAL HIGH (ref 0–40)
WBC: 9.5 10*3/uL (ref 4.0–10.5)

## 2008-12-07 ENCOUNTER — Encounter: Payer: Self-pay | Admitting: Internal Medicine

## 2009-01-29 ENCOUNTER — Ambulatory Visit: Payer: Self-pay | Admitting: Internal Medicine

## 2009-01-29 ENCOUNTER — Encounter: Payer: Self-pay | Admitting: Internal Medicine

## 2009-01-29 DIAGNOSIS — L0293 Carbuncle, unspecified: Secondary | ICD-10-CM

## 2009-01-29 DIAGNOSIS — L0292 Furuncle, unspecified: Secondary | ICD-10-CM | POA: Insufficient documentation

## 2009-01-29 LAB — CONVERTED CEMR LAB
ALT: 45 units/L — ABNORMAL HIGH (ref 0–35)
AST: 21 units/L (ref 0–37)
Albumin: 4.2 g/dL (ref 3.5–5.2)
Alkaline Phosphatase: 109 units/L (ref 39–117)
BUN: 17 mg/dL (ref 6–23)
Blood Glucose, AC Bkfst: 421 mg/dL
CO2: 27 meq/L (ref 19–32)
Calcium: 10 mg/dL (ref 8.4–10.5)
Chloride: 96 meq/L (ref 96–112)
Creatinine, Ser: 0.84 mg/dL (ref 0.40–1.20)
Glucose, Bld: 370 mg/dL — ABNORMAL HIGH (ref 70–99)
Hgb A1c MFr Bld: 12.1 %
Potassium: 4.3 meq/L (ref 3.5–5.3)
Sodium: 138 meq/L (ref 135–145)
Total Bilirubin: 0.4 mg/dL (ref 0.3–1.2)
Total Protein: 7.2 g/dL (ref 6.0–8.3)

## 2009-02-06 ENCOUNTER — Telehealth (INDEPENDENT_AMBULATORY_CARE_PROVIDER_SITE_OTHER): Payer: Self-pay | Admitting: Internal Medicine

## 2009-03-28 ENCOUNTER — Encounter: Payer: Self-pay | Admitting: Internal Medicine

## 2009-03-28 ENCOUNTER — Telehealth: Payer: Self-pay | Admitting: Internal Medicine

## 2009-03-28 ENCOUNTER — Ambulatory Visit: Payer: Self-pay | Admitting: Internal Medicine

## 2009-05-01 ENCOUNTER — Telehealth (INDEPENDENT_AMBULATORY_CARE_PROVIDER_SITE_OTHER): Payer: Self-pay | Admitting: Pharmacy Technician

## 2009-05-15 ENCOUNTER — Telehealth: Payer: Self-pay | Admitting: Internal Medicine

## 2009-05-25 ENCOUNTER — Telehealth: Payer: Self-pay | Admitting: *Deleted

## 2009-05-25 ENCOUNTER — Ambulatory Visit: Payer: Self-pay | Admitting: Internal Medicine

## 2009-05-28 ENCOUNTER — Ambulatory Visit: Payer: Self-pay | Admitting: Internal Medicine

## 2009-05-28 ENCOUNTER — Encounter: Payer: Self-pay | Admitting: Internal Medicine

## 2009-05-28 LAB — CONVERTED CEMR LAB
Blood Glucose, Fingerstick: 204
Hgb A1c MFr Bld: 11.8 %

## 2009-05-30 ENCOUNTER — Telehealth: Payer: Self-pay | Admitting: Licensed Clinical Social Worker

## 2009-06-12 ENCOUNTER — Telehealth: Payer: Self-pay | Admitting: Internal Medicine

## 2009-06-21 ENCOUNTER — Emergency Department (HOSPITAL_COMMUNITY): Admission: EM | Admit: 2009-06-21 | Discharge: 2009-06-21 | Payer: Self-pay | Admitting: Emergency Medicine

## 2009-06-21 ENCOUNTER — Telehealth: Payer: Self-pay | Admitting: Internal Medicine

## 2009-07-02 ENCOUNTER — Ambulatory Visit: Payer: Self-pay | Admitting: Internal Medicine

## 2009-07-02 LAB — CONVERTED CEMR LAB: Blood Glucose, Fingerstick: 207

## 2009-07-03 ENCOUNTER — Encounter: Payer: Self-pay | Admitting: Internal Medicine

## 2009-07-06 ENCOUNTER — Ambulatory Visit: Payer: Self-pay | Admitting: Infectious Diseases

## 2009-07-06 LAB — CONVERTED CEMR LAB: Blood Glucose, Fingerstick: 159

## 2009-07-09 ENCOUNTER — Encounter (HOSPITAL_BASED_OUTPATIENT_CLINIC_OR_DEPARTMENT_OTHER): Admission: RE | Admit: 2009-07-09 | Discharge: 2009-09-20 | Payer: Self-pay | Admitting: General Surgery

## 2009-07-12 ENCOUNTER — Ambulatory Visit: Payer: Self-pay | Admitting: Vascular Surgery

## 2009-07-12 ENCOUNTER — Encounter: Payer: Self-pay | Admitting: Internal Medicine

## 2009-07-13 ENCOUNTER — Ambulatory Visit: Payer: Self-pay | Admitting: Internal Medicine

## 2009-07-13 ENCOUNTER — Encounter: Payer: Self-pay | Admitting: Internal Medicine

## 2009-07-13 DIAGNOSIS — S92919A Unspecified fracture of unspecified toe(s), initial encounter for closed fracture: Secondary | ICD-10-CM | POA: Insufficient documentation

## 2009-07-13 LAB — CONVERTED CEMR LAB
Blood Glucose, Fingerstick: 283
Hgb A1c MFr Bld: 9.9 %

## 2009-07-16 LAB — CONVERTED CEMR LAB
HCT: 42.4 % (ref 36.0–46.0)
Hemoglobin: 14 g/dL (ref 12.0–15.0)
MCHC: 33 g/dL (ref 30.0–36.0)
MCV: 99.1 fL (ref 78.0–100.0)
Platelets: 154 10*3/uL (ref 150–400)
RBC: 4.28 M/uL (ref 3.87–5.11)
RDW: 14.1 % (ref 11.5–15.5)
WBC: 6.1 10*3/uL (ref 4.0–10.5)

## 2009-07-25 ENCOUNTER — Encounter: Payer: Self-pay | Admitting: Internal Medicine

## 2009-08-01 ENCOUNTER — Telehealth: Payer: Self-pay | Admitting: Internal Medicine

## 2009-08-13 ENCOUNTER — Ambulatory Visit: Payer: Self-pay | Admitting: Internal Medicine

## 2009-08-13 ENCOUNTER — Ambulatory Visit (HOSPITAL_COMMUNITY): Admission: RE | Admit: 2009-08-13 | Discharge: 2009-08-13 | Payer: Self-pay | Admitting: Internal Medicine

## 2009-08-13 DIAGNOSIS — M25569 Pain in unspecified knee: Secondary | ICD-10-CM | POA: Insufficient documentation

## 2009-08-13 LAB — CONVERTED CEMR LAB: Blood Glucose, Fingerstick: 208

## 2009-08-15 ENCOUNTER — Ambulatory Visit: Payer: Self-pay | Admitting: Internal Medicine

## 2009-08-15 LAB — CONVERTED CEMR LAB
ALT: 52 units/L — ABNORMAL HIGH (ref 0–35)
AST: 54 units/L — ABNORMAL HIGH (ref 0–37)
Albumin: 3.8 g/dL (ref 3.5–5.2)
Alkaline Phosphatase: 86 units/L (ref 39–117)
Anti Nuclear Antibody(ANA): NEGATIVE
BUN: 11 mg/dL (ref 6–23)
Basophils Absolute: 0.1 10*3/uL (ref 0.0–0.1)
Basophils Relative: 1 % (ref 0–1)
Bilirubin, Direct: 0.1 mg/dL (ref 0.0–0.3)
CO2: 25 meq/L (ref 19–32)
Calcium: 8.7 mg/dL (ref 8.4–10.5)
Chloride: 102 meq/L (ref 96–112)
Creatinine, Ser: 0.93 mg/dL (ref 0.40–1.20)
Eosinophils Absolute: 0.1 10*3/uL (ref 0.0–0.7)
Eosinophils Relative: 2 % (ref 0–5)
Glucose, Bld: 200 mg/dL — ABNORMAL HIGH (ref 70–99)
HCT: 47.1 % — ABNORMAL HIGH (ref 36.0–46.0)
Hemoglobin: 15.4 g/dL — ABNORMAL HIGH (ref 12.0–15.0)
Indirect Bilirubin: 0.4 mg/dL (ref 0.0–0.9)
Lymphocytes Relative: 50 % — ABNORMAL HIGH (ref 12–46)
Lymphs Abs: 4.1 10*3/uL — ABNORMAL HIGH (ref 0.7–4.0)
MCHC: 32.7 g/dL (ref 30.0–36.0)
MCV: 98.9 fL (ref >=78.0)
Monocytes Absolute: 0.4 10*3/uL (ref 0.1–1.0)
Monocytes Relative: 5 % (ref 3–12)
Neutro Abs: 3.5 10*3/uL (ref 1.7–7.7)
Neutrophils Relative %: 43 % (ref 43–77)
Platelets: 180 10*3/uL (ref 150–400)
Potassium: 4.1 meq/L (ref 3.5–5.3)
RBC: 4.76 M/uL (ref 3.87–5.11)
RDW: 14 % (ref 11.5–15.5)
Rhuematoid fact SerPl-aCnc: 21 intl units/mL — ABNORMAL HIGH (ref 0–20)
Sed Rate: 15 mm/hr (ref 0–22)
Sodium: 139 meq/L (ref 135–145)
Total Bilirubin: 0.5 mg/dL (ref 0.3–1.2)
Total Protein: 6.6 g/dL (ref 6.0–8.3)
WBC: 8.1 10*3/uL (ref 4.0–10.5)

## 2009-08-16 LAB — CONVERTED CEMR LAB
ALT: 64 units/L — ABNORMAL HIGH (ref 0–35)
AST: 59 units/L — ABNORMAL HIGH (ref 0–37)
Albumin: 4 g/dL (ref 3.5–5.2)
Alkaline Phosphatase: 87 units/L (ref 39–117)
Bilirubin, Direct: 0.1 mg/dL (ref 0.0–0.3)
HCV Ab: NEGATIVE
Hepatitis B Surface Ag: NEGATIVE
Indirect Bilirubin: 0.3 mg/dL (ref 0.0–0.9)
Total Bilirubin: 0.4 mg/dL (ref 0.3–1.2)
Total Protein: 6.9 g/dL (ref 6.0–8.3)

## 2009-09-06 ENCOUNTER — Telehealth: Payer: Self-pay | Admitting: Internal Medicine

## 2009-09-14 ENCOUNTER — Ambulatory Visit: Payer: Self-pay | Admitting: Infectious Diseases

## 2009-09-14 ENCOUNTER — Ambulatory Visit (HOSPITAL_COMMUNITY): Admission: RE | Admit: 2009-09-14 | Discharge: 2009-09-14 | Payer: Self-pay | Admitting: Infectious Diseases

## 2009-09-14 DIAGNOSIS — M25559 Pain in unspecified hip: Secondary | ICD-10-CM | POA: Insufficient documentation

## 2009-09-14 LAB — CONVERTED CEMR LAB
ALT: 65 units/L — ABNORMAL HIGH (ref 0–35)
AST: 65 units/L — ABNORMAL HIGH (ref 0–37)
Albumin: 4.1 g/dL (ref 3.5–5.2)
Alkaline Phosphatase: 93 units/L (ref 39–117)
BUN: 23 mg/dL (ref 6–23)
Blood Glucose, Fingerstick: 380
CO2: 22 meq/L (ref 19–32)
Calcium: 9 mg/dL (ref 8.4–10.5)
Chloride: 101 meq/L (ref 96–112)
Creatinine, Ser: 0.9 mg/dL (ref 0.40–1.20)
Creatinine, Urine: 53.4 mg/dL
Glucose, Bld: 339 mg/dL — ABNORMAL HIGH (ref 70–99)
Hgb A1c MFr Bld: 9.8 %
Microalb Creat Ratio: 9.4 mg/g (ref 0.0–30.0)
Microalb, Ur: 0.5 mg/dL (ref 0.00–1.89)
Potassium: 3.9 meq/L (ref 3.5–5.3)
Sodium: 137 meq/L (ref 135–145)
Total Bilirubin: 0.3 mg/dL (ref 0.3–1.2)
Total Protein: 6.8 g/dL (ref 6.0–8.3)

## 2009-09-21 ENCOUNTER — Telehealth: Payer: Self-pay | Admitting: Internal Medicine

## 2009-09-24 ENCOUNTER — Telehealth: Payer: Self-pay | Admitting: Internal Medicine

## 2009-09-26 ENCOUNTER — Encounter: Payer: Self-pay | Admitting: Internal Medicine

## 2009-09-27 ENCOUNTER — Telehealth (INDEPENDENT_AMBULATORY_CARE_PROVIDER_SITE_OTHER): Payer: Self-pay | Admitting: *Deleted

## 2009-10-10 ENCOUNTER — Telehealth: Payer: Self-pay | Admitting: Internal Medicine

## 2009-10-10 ENCOUNTER — Encounter: Payer: Self-pay | Admitting: Internal Medicine

## 2009-10-10 ENCOUNTER — Ambulatory Visit: Payer: Self-pay | Admitting: Internal Medicine

## 2009-10-10 LAB — CONVERTED CEMR LAB: Blood Glucose, Home Monitor: 2 mg/dL

## 2009-10-22 ENCOUNTER — Telehealth: Payer: Self-pay | Admitting: Internal Medicine

## 2009-10-22 DIAGNOSIS — B3731 Acute candidiasis of vulva and vagina: Secondary | ICD-10-CM | POA: Insufficient documentation

## 2009-10-22 DIAGNOSIS — B373 Candidiasis of vulva and vagina: Secondary | ICD-10-CM | POA: Insufficient documentation

## 2009-10-25 ENCOUNTER — Ambulatory Visit: Payer: Self-pay | Admitting: Internal Medicine

## 2009-10-25 DIAGNOSIS — F5104 Psychophysiologic insomnia: Secondary | ICD-10-CM | POA: Insufficient documentation

## 2009-10-25 LAB — CONVERTED CEMR LAB
Blood Glucose, Fingerstick: 329
Hgb A1c MFr Bld: 10.2 %

## 2009-11-01 ENCOUNTER — Encounter: Payer: Self-pay | Admitting: Internal Medicine

## 2009-11-05 ENCOUNTER — Encounter (INDEPENDENT_AMBULATORY_CARE_PROVIDER_SITE_OTHER): Payer: Self-pay | Admitting: *Deleted

## 2009-11-07 ENCOUNTER — Encounter (HOSPITAL_BASED_OUTPATIENT_CLINIC_OR_DEPARTMENT_OTHER): Admission: RE | Admit: 2009-11-07 | Discharge: 2009-12-03 | Payer: Self-pay | Admitting: Internal Medicine

## 2009-11-08 ENCOUNTER — Telehealth (INDEPENDENT_AMBULATORY_CARE_PROVIDER_SITE_OTHER): Payer: Self-pay | Admitting: *Deleted

## 2009-11-20 ENCOUNTER — Telehealth (INDEPENDENT_AMBULATORY_CARE_PROVIDER_SITE_OTHER): Payer: Self-pay | Admitting: *Deleted

## 2009-11-22 ENCOUNTER — Telehealth: Payer: Self-pay | Admitting: Internal Medicine

## 2009-11-23 ENCOUNTER — Ambulatory Visit: Payer: Self-pay | Admitting: Infectious Diseases

## 2009-11-23 ENCOUNTER — Encounter: Payer: Self-pay | Admitting: Internal Medicine

## 2009-11-23 DIAGNOSIS — R079 Chest pain, unspecified: Secondary | ICD-10-CM | POA: Insufficient documentation

## 2009-11-23 LAB — CONVERTED CEMR LAB: Blood Glucose, Fingerstick: 160

## 2009-11-26 ENCOUNTER — Encounter: Payer: Self-pay | Admitting: Cardiology

## 2009-11-27 ENCOUNTER — Ambulatory Visit: Payer: Self-pay | Admitting: Internal Medicine

## 2009-11-29 DIAGNOSIS — I10 Essential (primary) hypertension: Secondary | ICD-10-CM | POA: Insufficient documentation

## 2009-11-29 DIAGNOSIS — E1159 Type 2 diabetes mellitus with other circulatory complications: Secondary | ICD-10-CM | POA: Insufficient documentation

## 2009-11-29 DIAGNOSIS — E1139 Type 2 diabetes mellitus with other diabetic ophthalmic complication: Secondary | ICD-10-CM | POA: Insufficient documentation

## 2009-11-29 DIAGNOSIS — H42 Glaucoma in diseases classified elsewhere: Secondary | ICD-10-CM

## 2009-11-30 ENCOUNTER — Ambulatory Visit: Payer: Self-pay | Admitting: Cardiology

## 2009-12-03 ENCOUNTER — Telehealth: Payer: Self-pay | Admitting: Licensed Clinical Social Worker

## 2009-12-06 ENCOUNTER — Telehealth (INDEPENDENT_AMBULATORY_CARE_PROVIDER_SITE_OTHER): Payer: Self-pay | Admitting: *Deleted

## 2009-12-11 ENCOUNTER — Ambulatory Visit: Payer: Self-pay

## 2009-12-11 ENCOUNTER — Ambulatory Visit: Payer: Self-pay | Admitting: Cardiology

## 2009-12-11 ENCOUNTER — Encounter (HOSPITAL_COMMUNITY): Admission: RE | Admit: 2009-12-11 | Discharge: 2010-02-25 | Payer: Self-pay | Admitting: Cardiology

## 2009-12-11 ENCOUNTER — Encounter: Payer: Self-pay | Admitting: Cardiovascular Disease

## 2009-12-25 ENCOUNTER — Encounter: Payer: Self-pay | Admitting: Licensed Clinical Social Worker

## 2010-01-15 ENCOUNTER — Telehealth: Payer: Self-pay | Admitting: Internal Medicine

## 2010-01-28 ENCOUNTER — Telehealth (INDEPENDENT_AMBULATORY_CARE_PROVIDER_SITE_OTHER): Payer: Self-pay | Admitting: *Deleted

## 2010-02-07 ENCOUNTER — Ambulatory Visit: Payer: Self-pay | Admitting: Internal Medicine

## 2010-02-07 LAB — CONVERTED CEMR LAB
ALT: 39 units/L — ABNORMAL HIGH (ref 0–35)
AST: 37 units/L (ref 0–37)
Albumin: 3.9 g/dL (ref 3.5–5.2)
Alkaline Phosphatase: 85 units/L (ref 39–117)
BUN: 10 mg/dL (ref 6–23)
Blood Glucose, Fingerstick: 250
CO2: 25 meq/L (ref 19–32)
Calcium: 9.2 mg/dL (ref 8.4–10.5)
Chloride: 104 meq/L (ref 96–112)
Cholesterol: 173 mg/dL (ref 0–200)
Creatinine, Ser: 0.67 mg/dL (ref 0.40–1.20)
Glucose, Bld: 213 mg/dL — ABNORMAL HIGH (ref 70–99)
HDL: 35 mg/dL — ABNORMAL LOW (ref >39)
Hgb A1c MFr Bld: 9.8 %
LDL Cholesterol: 109 mg/dL — ABNORMAL HIGH (ref 0–99)
Potassium: 3.9 meq/L (ref 3.5–5.3)
Sodium: 140 meq/L (ref 135–145)
Total Bilirubin: 0.3 mg/dL (ref 0.3–1.2)
Total CHOL/HDL Ratio: 4.9
Total Protein: 6.5 g/dL (ref 6.0–8.3)
Triglycerides: 145 mg/dL (ref <150)
VLDL: 29 mg/dL (ref 0–40)

## 2010-02-08 ENCOUNTER — Telehealth: Payer: Self-pay | Admitting: Internal Medicine

## 2010-02-12 ENCOUNTER — Telehealth: Payer: Self-pay | Admitting: Internal Medicine

## 2010-02-15 ENCOUNTER — Telehealth: Payer: Self-pay | Admitting: Internal Medicine

## 2010-02-19 ENCOUNTER — Telehealth: Payer: Self-pay | Admitting: Internal Medicine

## 2010-03-01 ENCOUNTER — Telehealth: Payer: Self-pay | Admitting: Internal Medicine

## 2010-03-20 ENCOUNTER — Telehealth: Payer: Self-pay | Admitting: Internal Medicine

## 2010-03-21 ENCOUNTER — Telehealth: Payer: Self-pay | Admitting: Internal Medicine

## 2010-03-22 ENCOUNTER — Telehealth: Payer: Self-pay | Admitting: Internal Medicine

## 2010-04-01 ENCOUNTER — Ambulatory Visit: Payer: Self-pay | Admitting: Internal Medicine

## 2010-04-25 ENCOUNTER — Telehealth: Payer: Self-pay | Admitting: Internal Medicine

## 2010-04-29 ENCOUNTER — Telehealth: Payer: Self-pay | Admitting: Internal Medicine

## 2010-05-02 ENCOUNTER — Telehealth (INDEPENDENT_AMBULATORY_CARE_PROVIDER_SITE_OTHER): Payer: Self-pay | Admitting: *Deleted

## 2010-05-08 ENCOUNTER — Encounter: Payer: Self-pay | Admitting: Internal Medicine

## 2010-05-08 ENCOUNTER — Ambulatory Visit: Payer: Self-pay | Admitting: Infectious Disease

## 2010-05-08 LAB — CONVERTED CEMR LAB: Hgb A1c MFr Bld: 12.5 %

## 2010-05-10 ENCOUNTER — Telehealth (INDEPENDENT_AMBULATORY_CARE_PROVIDER_SITE_OTHER): Payer: Self-pay | Admitting: *Deleted

## 2010-05-14 ENCOUNTER — Telehealth: Payer: Self-pay | Admitting: Licensed Clinical Social Worker

## 2010-05-22 ENCOUNTER — Telehealth: Payer: Self-pay | Admitting: Licensed Clinical Social Worker

## 2010-06-03 ENCOUNTER — Ambulatory Visit (HOSPITAL_COMMUNITY): Admission: RE | Admit: 2010-06-03 | Discharge: 2010-06-03 | Payer: Self-pay | Admitting: Internal Medicine

## 2010-06-03 ENCOUNTER — Ambulatory Visit: Payer: Self-pay | Admitting: Internal Medicine

## 2010-06-03 LAB — CONVERTED CEMR LAB: Blood Glucose, Fingerstick: 386

## 2010-06-28 ENCOUNTER — Ambulatory Visit: Payer: Self-pay | Admitting: Internal Medicine

## 2010-06-28 LAB — CONVERTED CEMR LAB
Amphetamine Screen, Ur: NEGATIVE
Barbiturate Quant, Ur: NEGATIVE
Benzodiazepines.: NEGATIVE
Cocaine Metabolites: NEGATIVE
Creatinine,U: 81.3 mg/dL
Marijuana Metabolite: POSITIVE — AB
Methadone: NEGATIVE
Opiates: NEGATIVE
Phencyclidine (PCP): NEGATIVE
Propoxyphene: NEGATIVE

## 2010-07-09 ENCOUNTER — Encounter: Payer: Self-pay | Admitting: Family Medicine

## 2010-07-09 ENCOUNTER — Ambulatory Visit: Payer: Self-pay

## 2010-07-09 DIAGNOSIS — M7500 Adhesive capsulitis of unspecified shoulder: Secondary | ICD-10-CM | POA: Insufficient documentation

## 2010-07-15 ENCOUNTER — Emergency Department (HOSPITAL_COMMUNITY): Admission: EM | Admit: 2010-07-15 | Discharge: 2010-07-15 | Payer: Self-pay | Admitting: Emergency Medicine

## 2010-07-16 ENCOUNTER — Ambulatory Visit: Payer: Self-pay | Admitting: Family Medicine

## 2010-07-16 DIAGNOSIS — L97909 Non-pressure chronic ulcer of unspecified part of unspecified lower leg with unspecified severity: Secondary | ICD-10-CM | POA: Insufficient documentation

## 2010-07-18 ENCOUNTER — Ambulatory Visit: Payer: Self-pay | Admitting: Sports Medicine

## 2010-07-24 ENCOUNTER — Ambulatory Visit (HOSPITAL_COMMUNITY): Admission: RE | Admit: 2010-07-24 | Discharge: 2010-07-24 | Payer: Self-pay | Admitting: Internal Medicine

## 2010-07-24 ENCOUNTER — Telehealth: Payer: Self-pay | Admitting: Internal Medicine

## 2010-07-25 ENCOUNTER — Ambulatory Visit: Payer: Self-pay | Admitting: Sports Medicine

## 2010-07-26 ENCOUNTER — Encounter (HOSPITAL_BASED_OUTPATIENT_CLINIC_OR_DEPARTMENT_OTHER)
Admission: RE | Admit: 2010-07-26 | Discharge: 2010-10-16 | Payer: Self-pay | Source: Home / Self Care | Admitting: General Surgery

## 2010-07-26 ENCOUNTER — Encounter: Payer: Self-pay | Admitting: Internal Medicine

## 2010-07-30 ENCOUNTER — Ambulatory Visit (HOSPITAL_COMMUNITY): Admission: RE | Admit: 2010-07-30 | Discharge: 2010-07-30 | Payer: Self-pay | Admitting: General Surgery

## 2010-08-07 ENCOUNTER — Telehealth: Payer: Self-pay | Admitting: Internal Medicine

## 2010-08-08 ENCOUNTER — Telehealth: Payer: Self-pay | Admitting: Internal Medicine

## 2010-08-11 ENCOUNTER — Emergency Department (HOSPITAL_COMMUNITY): Admission: EM | Admit: 2010-08-11 | Discharge: 2010-08-11 | Payer: Self-pay | Admitting: Family Medicine

## 2010-08-12 ENCOUNTER — Encounter: Admission: RE | Admit: 2010-08-12 | Discharge: 2010-08-29 | Payer: Self-pay | Admitting: Internal Medicine

## 2010-08-19 ENCOUNTER — Telehealth: Payer: Self-pay | Admitting: Internal Medicine

## 2010-08-20 ENCOUNTER — Ambulatory Visit: Payer: Self-pay | Admitting: Internal Medicine

## 2010-08-20 LAB — CONVERTED CEMR LAB
Blood Glucose, Fingerstick: 245
Hgb A1c MFr Bld: 10 %

## 2010-08-23 ENCOUNTER — Telehealth: Payer: Self-pay | Admitting: Internal Medicine

## 2010-08-28 ENCOUNTER — Telehealth: Payer: Self-pay | Admitting: Internal Medicine

## 2010-09-23 ENCOUNTER — Encounter: Payer: Self-pay | Admitting: Internal Medicine

## 2010-09-30 ENCOUNTER — Telehealth: Payer: Self-pay | Admitting: Internal Medicine

## 2010-10-01 ENCOUNTER — Encounter: Payer: Self-pay | Admitting: Internal Medicine

## 2010-10-21 ENCOUNTER — Telehealth: Payer: Self-pay | Admitting: Internal Medicine

## 2010-10-24 ENCOUNTER — Telehealth: Payer: Self-pay | Admitting: Internal Medicine

## 2010-10-31 ENCOUNTER — Telehealth: Payer: Self-pay | Admitting: *Deleted

## 2010-11-05 ENCOUNTER — Encounter (INDEPENDENT_AMBULATORY_CARE_PROVIDER_SITE_OTHER): Payer: Self-pay | Admitting: *Deleted

## 2010-11-05 ENCOUNTER — Ambulatory Visit: Payer: Self-pay | Admitting: Internal Medicine

## 2010-11-05 LAB — CONVERTED CEMR LAB
Blood Glucose, Fingerstick: 257
Hgb A1c MFr Bld: 10.6 %

## 2010-11-06 ENCOUNTER — Encounter: Payer: Self-pay | Admitting: Gastroenterology

## 2010-11-22 ENCOUNTER — Ambulatory Visit: Payer: Self-pay | Admitting: Vascular Surgery

## 2010-11-25 ENCOUNTER — Telehealth: Payer: Self-pay | Admitting: Internal Medicine

## 2010-12-19 ENCOUNTER — Telehealth: Payer: Self-pay | Admitting: Internal Medicine

## 2010-12-22 DIAGNOSIS — K635 Polyp of colon: Secondary | ICD-10-CM

## 2010-12-22 HISTORY — DX: Polyp of colon: K63.5

## 2010-12-26 ENCOUNTER — Ambulatory Visit
Admission: RE | Admit: 2010-12-26 | Discharge: 2010-12-26 | Payer: Self-pay | Source: Home / Self Care | Attending: Gastroenterology | Admitting: Gastroenterology

## 2010-12-26 ENCOUNTER — Encounter: Payer: Self-pay | Admitting: Gastroenterology

## 2010-12-26 ENCOUNTER — Telehealth: Payer: Self-pay | Admitting: Internal Medicine

## 2011-01-03 ENCOUNTER — Telehealth: Payer: Self-pay | Admitting: Gastroenterology

## 2011-01-07 ENCOUNTER — Telehealth: Payer: Self-pay | Admitting: Internal Medicine

## 2011-01-09 ENCOUNTER — Ambulatory Visit: Admission: RE | Admit: 2011-01-09 | Discharge: 2011-01-09 | Payer: Self-pay | Source: Home / Self Care

## 2011-01-09 DIAGNOSIS — B029 Zoster without complications: Secondary | ICD-10-CM | POA: Insufficient documentation

## 2011-01-09 LAB — CONVERTED CEMR LAB: Blood Glucose, Fingerstick: 390

## 2011-01-11 ENCOUNTER — Encounter: Payer: Self-pay | Admitting: Interventional Radiology

## 2011-01-12 ENCOUNTER — Encounter: Payer: Self-pay | Admitting: Internal Medicine

## 2011-01-13 LAB — GLUCOSE, CAPILLARY: Glucose-Capillary: 390 mg/dL — ABNORMAL HIGH (ref 70–99)

## 2011-01-14 ENCOUNTER — Telehealth: Payer: Self-pay | Admitting: Gastroenterology

## 2011-01-15 ENCOUNTER — Other Ambulatory Visit: Payer: Self-pay | Admitting: Gastroenterology

## 2011-01-15 ENCOUNTER — Ambulatory Visit
Admission: RE | Admit: 2011-01-15 | Discharge: 2011-01-15 | Payer: Self-pay | Source: Home / Self Care | Attending: Gastroenterology | Admitting: Gastroenterology

## 2011-01-15 LAB — GLUCOSE, CAPILLARY
Glucose-Capillary: 189 mg/dL — ABNORMAL HIGH (ref 70–99)
Glucose-Capillary: 129 mg/dL — ABNORMAL HIGH (ref 70–99)

## 2011-01-20 ENCOUNTER — Telehealth: Payer: Self-pay | Admitting: Internal Medicine

## 2011-01-21 ENCOUNTER — Encounter: Payer: Self-pay | Admitting: Gastroenterology

## 2011-01-21 NOTE — Progress Notes (Signed)
Summary: diabetes support/dmr  Phone Note Call from Patient Call back at Home Phone [phone]   Caller: Patient Summary of Call: patient called ot find out when her appointment was today. Her appointment did not get on the schedule. She rescheduled for necxt Tuesday 12-7 at 11:30 am. Says her neuropathy is very painful and her blood sugar is high form Thanksgiving. Discussed that the two may very well be related and that she should check her blood sugar every 4 hours nad try ot get it back under control to give her neuropathic painthe best chance for improvement. Patient verbalized understanding. Encouraged her to come in sooner if needed or call for questions or concerns. Initial call taken by: [name] RD,CDE,  November 20, 2009 9:42 AM

## 2011-01-21 NOTE — Assessment & Plan Note (Signed)
Summary: routine visit/gg   Vital Signs:  Patient profile:   50 year old female Height:      66.6 inches (169.16 cm) Weight:      253.1 pounds (115.05 kg) BMI:     40.26 Temp:     97.1 degrees F (36.17 degrees C) oral Pulse rate:   55 / minute BP sitting:   123 / 74  (right arm)  Vitals Entered By: Stanton Kidney Ditzler RN (August 20, 2010 4:16 PM) Is Patient Diabetic? Yes Did you bring your meter with you today? No Pain Assessment Patient in pain? yes     Location: ft and hands Intensity: 10+ Type: burning Onset of pain  long time Nutritional Status BMI of > 30 = obese Nutritional Status Detail appetite good CBG Result 245  Have you ever been in a relationship where you felt threatened, hurt or afraid?denies   Does patient need assistance? Functional Status Self care Ambulation Impaired:Risk for fall Comments Uses a cane. Ck-up.   Primary Care Provider:  Mliss Sax MD   History of Present Illness: 50 yo female with PMHx outlined below presents to [facility] for regular follow up appointment. Her main concern today is persistant shoulder pain for which she has been on tramadol and advil and vicodin in the past but this has not helped her. SHe had therapy for it and is continuing PT but finds it difficult to do more work due to pain. Her left shoulder hurts more than right one. She has no other concerns at the time. No recent sicknesses or hospitalizaitons. No episodes of chest pain, SOB, palpitations, no fever or chills. No specific abdominal or urinary concerns. No recent changes in appetite, weight, sleep patterns, mood.    In addition, she feels like her depression medicine is not working and would like to be switched to another medicine. She has been feeling "down" for the past few weeks and hs had more insomnia than usual. No SI.  Depression History:      The patient denies a depressed mood most of the day and a diminished interest in her usual daily activities.  Positive  alarm features for depression include insomnia and fatigue (loss of energy).  However, she denies significant weight loss, significant weight gain, hypersomnia, psychomotor agitation, psychomotor retardation, feelings of worthlessness (guilt), impaired concentration (indecisiveness), and recurrent thoughts of death or suicide.        The patient denies that she feels like life is not worth living, denies that she wishes that she were dead, and denies that she has thought about ending her life.         Preventive Screening-Counseling & Management  Alcohol-Tobacco     Alcohol drinks/day: 0     Smoking Status: current     Smoking Cessation Counseling: yes     Packs/Day: 1 ppd     Year Started: years     Passive Smoke Exposure: yes  Caffeine-Diet-Exercise     Does Patient Exercise: no  Problems Prior to Update: 1)  Foot Ulcer, Right  (ICD-707.10) 2)  Closed Fracture of One or More Phalanges of Foot  (ICD-826.0) 3)  Adhesive Capsulitis, Left  (ICD-726.0) 4)  Shoulder Pain, Left  (ICD-719.41) 5)  Hyperlipidemia Nec/nos  (ICD-272.4) 6)  Hypertension, Benign Essential  (ICD-401.1) 7)  Hypertension  (ICD-401.9) 8)  Chest Pain  (ICD-786.50) 9)  Insomnia  (ICD-780.52) 10)  Candidiasis, Vaginal  (ICD-112.1) 11)  Hip Pain, Right  (ICD-719.45) 12)  Boils, Recurrent  (ICD-680.9)  13)  Diab W/neuro Manifests Type Ii/uns Not Uncntrl  (ICD-250.60) 14)  Depression  (ICD-311) 15)  Diabetic Foot Ulcer, Right  (ICD-250.80) 16)  Claudication  (ICD-443.9) 17)  Dm W/manifestation Nec, Type II, Uncontrolled  (ICD-250.82) 18)  Tobacco Abuse  (ICD-305.1) 19)  COPD  (ICD-496) 20)  Transaminases, Serum, Elevated  (ICD-790.4) 21)  Diabetic Peripheral Neuropathy  (ICD-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) 22)  Knee Pain  (ICD-719.46) 23)  Fracture, Toe, Left  (ICD-826.0) 24)  Diabetic Foot Ulcer, Left  (ICD-250.80) 25)  Inadequate Material Resources  (ICD-V60.2) 26)  Nausea  (ICD-787.02) 27)  Weight Loss  (ICD-783.21) 28)  Dysphagia  Unspecified  (ICD-787.20) 29)  Gastroesophageal Reflux Disease  (ICD-530.81) 30)  Ulcer of Other Part of Foot  (ICD-707.15) 31)  Other&unspecified Diseases The Oral Soft Tissues  (ICD-528.9) 32)  Abscess, Skin  (ICD-682.9) 33)  Bell's Palsy  (ICD-351.0) 34)  Back Pain  (ICD-724.5) 35)  Health Screening  (ICD-V70.0) 36)  Abscess, Perirectal, Hx of  (ICD-V13.3) 37)  Renal Failure, Acute, Hx of  (ICD-V13.09) 38)  Glaucoma  (ICD-365.9) 39)  Disc Disease, Lumbar  (ICD-722.52)  Medications Prior to Update: 1)  Atenolol 100 Mg Tabs (Atenolol) .... Take 1 Tablet By Mouth Once A Day 2)  Accupril 20 Mg Tabs (Quinapril Hcl) .... Take 1 Tablet By Mouth Once A Day 3)  Hydrochlorothiazide 25 Mg Tabs (Hydrochlorothiazide) .... Take 1 Tablet By Mouth Once A Day 4)  Advair Diskus 250-50 Mcg/dose Misc (Fluticasone-Salmeterol) .... One Puff Two Times A Day 5)  Plavix 75 Mg  Tabs (Clopidogrel Bisulfate) .... Take 1 Tablet By Mouth Once A Day 6)  Ra Pen Needles 31g X 8 Mm Misc (Insulin Pen Needle) .... Use 3 Times Per Day To Check The Sugar Level 7)  Lyrica 100 Mg Caps (Pregabalin) .... Take 1 Tablet By Mouth Three Times A Day 8)  Tramadol Hcl 50 Mg Tabs (Tramadol Hcl) .... One Tab Up To Four Times A Day For Pain 9)  Humalog Mix 75/25 Pen 75-25 % Susp (Insulin Lispro Prot & Lispro) .... Inject Under The Skin 110 Units in The Am and 55 Units in The Evening Every Day 10)  Truetrack Test  Strp (Glucose Blood) .... Use To Check Blood Sugar Twice Daily 11)  Lancets  Misc (Lancets) .... Use To Check Blood Sugar Twice Daily 12)  Nitrostat 0.4 Mg Subl (Nitroglycerin) .... Take One Tablet Under Your Tongue With Severe Chest Pain, Repeat in 5 Minutes. Attend To Doctor/hospital If Had To Take More Then 3 in 20 Minutes 13)  Diflucan 100 Mg Tabs (Fluconazole) .... Take 1 Tablet By Mouth Two Times A Day 14)  Lexapro 10 Mg Tabs (Escitalopram Oxalate) .... Take 1 Tablet By Mouth Once A Day 15)  Hydrocodone-Acetaminophen  5-325 Mg Tabs (Hydrocodone-Acetaminophen) .... Take 1 Tablet By Mouth Two Times A Day As Needed For Pain.  Current Medications (verified): 1)  Atenolol 100 Mg Tabs (Atenolol) .... Take 1 Tablet By Mouth Once A Day 2)  Accupril 20 Mg Tabs (Quinapril Hcl) .... Take 1 Tablet By Mouth Once A Day 3)  Hydrochlorothiazide 25 Mg Tabs (Hydrochlorothiazide) .... Take 1 Tablet By Mouth Once A Day 4)  Advair Diskus 250-50 Mcg/dose Misc (Fluticasone-Salmeterol) .... One Puff Two Times A Day 5)  Plavix 75 Mg  Tabs (Clopidogrel Bisulfate) .... Take 1 Tablet By Mouth Once A Day 6)  Ra Pen Needles 31g X 8 Mm Misc (Insulin Pen Needle) .... Use 3 Times Per Day To Check The Sugar  Level 7)  Lyrica 100 Mg Caps (Pregabalin) .... Take 1 Tablet By Mouth Three Times A Day 8)  Tramadol Hcl 50 Mg Tabs (Tramadol Hcl) .... One Tab Up To Four Times A Day For Pain 9)  Humalog Mix 75/25 Pen 75-25 % Susp (Insulin Lispro Prot & Lispro) .... Inject Under The Skin 110 Units in The Am and 55 Units in The Evening Every Day 10)  Truetrack Test  Strp (Glucose Blood) .... Use To Check Blood Sugar Twice Daily 11)  Lancets  Misc (Lancets) .... Use To Check Blood Sugar Twice Daily 12)  Nitrostat 0.4 Mg Subl (Nitroglycerin) .... Take One Tablet Under Your Tongue With Severe Chest Pain, Repeat in 5 Minutes. Attend To Doctor/hospital If Had To Take More Then 3 in 20 Minutes 13)  Diflucan 100 Mg Tabs (Fluconazole) .... Take 1 Tablet By Mouth Two Times A Day 14)  Lexapro 10 Mg Tabs (Escitalopram Oxalate) .... Take 1 Tablet By Mouth Once A Day 15)  Hydrocodone-Acetaminophen 5-325 Mg Tabs (Hydrocodone-Acetaminophen) .... Take 1 Tablet By Mouth Two Times A Day As Needed For Pain.  Allergies: 1)  ! Morphine Sulfate (Morphine Sulfate) 2)  ! * Iodinated Contrast  Past History:  Past Medical History: Last updated: 11/30/2009 Current Problems:  HYPERLIPIDEMIA NEC/NOS (ICD-272.4) HYPERTENSION, BENIGN ESSENTIAL (ICD-401.1) CANDIDIASIS, VAGINAL  (ICD-112.1) BOILS, RECURRENT (ICD-680.9) DIAB W/NEURO MANIFESTS TYPE II/UNS NOT UNCNTRL (ICD-250.60) DEPRESSION (ICD-311) DIABETIC FOOT ULCER, RIGHT (ICD-250.80) PVD DM W/MANIFESTATION NEC, TYPE II, UNCONTROLLED (ICD-250.82) COPD (ICD-496) TRANSAMINASES, SERUM, ELEVATED (ICD-790.4) DIABETIC PERIPHERAL NEUROPATHY (ICD-250.60) GASTROESOPHAGEAL REFLUX DISEASE (ICD-530.81) BELL'S PALSY (ICD-351.0) ABSCESS, PERIRECTAL, HX OF (ICD-V13.3) RENAL FAILURE, ACUTE, HX OF (ICD-V13.09) with contrast H/O elevated LFTs with statins GLAUCOMA (ICD-365.9) DISC DISEASE, 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   Social History: Last updated: 11/30/2009 Lives with daughter-in-law in apartment, currently unemployed Divorced  Tobacco Use - Yes.  Alcohol Use - no  Risk Factors: Alcohol Use: 0 (08/20/2010) Exercise: no (08/20/2010)  Risk Factors: Smoking Status: current (08/20/2010) Packs/Day: 1 ppd (08/20/2010) Passive Smoke Exposure: yes (08/20/2010)  Family History: Reviewed history from 06/28/2010 and no changes required. Mother  DM2, CHF, living Father asthma, living Brothers and sisters asthma since childhood Younger sister had heart attack at age of 80, since then she is doing well.   Social History: Reviewed history from 11/30/2009 and no changes required. Lives with daughter-in-law in apartment, currently unemployed Divorced  Tobacco Use - Yes.  Alcohol Use - no  Review of Systems       per HPI  Physical Exam  General:  Well-developed,well-nourished,in no acute distress;  alert,appropriate and cooperative throughout examination Lungs:  normal respiratory effort.   Heart:  Normal rate and regular rhythm. S1 and S2 normal without gallop, murmur, click, rub or other extra sounds. Abdomen:  Bowel sounds positive,abdomen soft and non-tender without masses, organomegaly or hernias noted.   Shoulder/Elbow Exam  Shoulder Exam:    Right:    Inspection:  Normal    Palpation:  Abnormal       Location:  right bicipital groove    Stability:  stable    Tenderness:  right bicipital groove    Swelling:  no    Erythema:  no    Left:    Inspection:  Normal    Palpation:  Abnormal       Location:  right bicipital groove    Stability:  stable    Tenderness:  right bicipital groove    Swelling:  no    Erythema:  no   Impression & Recommendations:  Problem # 1:  HYPERLIPIDEMIA NEC/NOS (ICD-272.4) LDL above the goal, will reassess FLP on her next appointment. Labs Reviewed: SGOT: 37 (02/07/2010)   SGPT: 39 (02/07/2010)   HDL:35 (02/07/2010), 30 (11/24/2008)  LDL:109 (02/07/2010), 79 (16/09/9603)  Chol:173 (02/07/2010), 171 (11/24/2008)  Trig:145 (02/07/2010), 310 (11/24/2008)  Problem # 2:  HYPERTENSION, BENIGN ESSENTIAL (ICD-401.1) At goal, will cont the same regimen.  Her updated medication list for this problem includes:    Atenolol 100 Mg Tabs (Atenolol) .Marland Kitchen... Take 1 tablet by mouth once a day    Accupril 20 Mg Tabs (Quinapril hcl) .Marland Kitchen... Take 1 tablet by mouth once a day    Hydrochlorothiazide 25 Mg Tabs (Hydrochlorothiazide) .Marland Kitchen... Take 1 tablet by mouth once a day  BP today: 123/74 Prior BP: 127/84 (07/25/2010)  Labs Reviewed: K+: 3.9 (02/07/2010) Creat: : 0.67 (02/07/2010)   Chol: 173 (02/07/2010)   HDL: 35 (02/07/2010)   LDL: 109 (02/07/2010)   TG: 145 (02/07/2010)  Problem # 3:  DEPRESSION (ICD-311)  Her updated medication list for this problem includes:    Lexapro 10 Mg Tabs (Escitalopram oxalate) .Marland Kitchen... Take 1 tablet by mouth once a day     Zoloft 25 Mg Tabs (Sertraline hcl) .Marland Kitchen... Take 1 tablet by mouth once a day  Problem # 4:  ADHESIVE CAPSULITIS, LEFT (ICD-726.0) Will change to percocet and see if that helps so that pt can cont PT>.  Complete Medication List: 1)  Atenolol 100 Mg Tabs (Atenolol) .... Take 1 tablet by mouth once a day 2)  Accupril 20 Mg Tabs (Quinapril hcl) .... Take 1 tablet by mouth once a day 3)  Hydrochlorothiazide 25 Mg Tabs (Hydrochlorothiazide) .... Take 1 tablet by mouth once a day 4)  Advair Diskus 250-50 Mcg/dose Misc (Fluticasone-salmeterol) .... One puff two times a day 5)  Plavix 75 Mg Tabs (Clopidogrel bisulfate) .... Take 1 tablet by mouth once a day 6)  Ra Pen Needles 31g X 8 Mm Misc (Insulin pen needle) .... Use 3 times per day to check the sugar level 7)  Lyrica 100 Mg Caps (Pregabalin) .... Take 1 tablet by mouth three times a day 8)  Percocet 5-325 Mg Tabs (Oxycodone-acetaminophen) .... Take 1 tablet twice daily for pain 9)  Humalog Mix 75/25 Pen 75-25 % Susp (Insulin lispro prot & lispro) .... Inject under the skin 110 units in the am and 55 units in the evening every day 10)  Truetrack Test Strp (Glucose blood) .... Use to check blood sugar twice daily 11)  Lancets Misc (Lancets) .... Use to check blood sugar twice daily 12)  Nitrostat 0.4 Mg Subl (Nitroglycerin) .... Take one tablet under your tongue with severe chest pain, repeat in 5 minutes. attend to doctor/hospital if had to take more then 3 in 20 minutes 13)  Diflucan 100 Mg Tabs (Fluconazole) .... Take 1 tablet by mouth two times a day 14)  Lexapro 10 Mg Tabs (Escitalopram oxalate) .... Take 1 tablet by mouth once a day 15)  Hydrocodone-acetaminophen 5-325 Mg Tabs (Hydrocodone-acetaminophen) .... Take 1 tablet by mouth two times a day as needed for pain. 16)  Zoloft 25 Mg Tabs (Sertraline hcl) .... Take 1 tablet by mouth once a day  Other Orders: T- Capillary Blood Glucose (16109) T-Hgb A1C (in-house) (60454UJ) Influenza Vaccine  NON MCR (81191)  Patient Instructions: 1)  Please schedule a follow-up appointment in 3 months. Prescriptions: PERCOCET 5-325 MG TABS (OXYCODONE-ACETAMINOPHEN) take 1 tablet twice daily for pain  #40 x 0   Entered and Authorized by:   Mliss Sax MD   Signed by:   Mliss Sax MD on 08/20/2010   Method used:   Print then Give to Patient   RxID:   [id number] ZOLOFT 25 MG TABS (SERTRALINE HCL) Take 1 tablet by mouth once a day  #30 x 3   Entered and Authorized by:   Mliss Sax MD   Signed by:   Mliss Sax MD on 08/20/2010   Method used:   Electronically to        [facility] (retail)       [address]       [place], [place]  [address]       Ph: [phone]       Fax: [phone]   RxID:   [id number]    Prevention & Chronic Care Immunizations   Influenza vaccine: Fluvax Non-MCR  (08/20/2010)   Influenza vaccine deferral: Not indicated  (04/01/2010)    Tetanus booster: Not documented   Td booster deferral: Not indicated  (10/25/2009)    Pneumococcal vaccine: Not documented  Colorectal Screening   Hemoccult: Not documented   Hemoccult action/deferral: Not indicated  (10/25/2009)    Colonoscopy: Not documented   Colonoscopy action/deferral: Not indicated  (10/25/2009)  Other Screening   Pap smear: Normal  (08/17/2001)   Pap smear action/deferral: Not indicated-other  (04/01/2010)    Mammogram: ASSESSMENT: Negative - BI-RADS 1^MM DIGITAL SCREENING  (07/24/2010)   Mammogram action/deferral: Ordered  (06/28/2010)   Mammogram due: 11/09/2009   Smoking status: current  (08/20/2010)   Smoking cessation counseling: yes  (08/20/2010)  Diabetes Mellitus   HgbA1C: 10.0  (08/20/2010)   HgbA1C action/deferral: Ordered  (09/14/2009)    Eye exam: No diabetic retinopathy.     (05/31/2008)   Diabetic eye exam action/deferral: Ophthalmology referral  (06/28/2010)   Eye exam due: 05/2009    Foot exam: yes  (10/25/2009)   High  risk foot: Not documented   Foot care education: Not documented    Urine microalbumin/creatinine ratio: 9.4  (09/14/2009)   Urine microalbumin action/deferral: Ordered    Diabetes flowsheet reviewed?: Yes   Progress toward A1C goal: At goal  Lipids   Total Cholesterol: 173  (02/07/2010)   LDL: 109  (02/07/2010)   LDL Direct: Not documented   HDL: 35  (02/07/2010)   Triglycerides: 145  (02/07/2010)    SGOT (AST): 37  (02/07/2010)   SGPT (ALT): 39  (02/07/2010)   Alkaline phosphatase: 85  (02/07/2010)   Total bilirubin: 0.3  (02/07/2010)    Lipid flowsheet reviewed?: Yes   Progress toward LDL goal: Unchanged  Hypertension   Last Blood Pressure: 123 / 74  (08/20/2010)   Serum creatinine: 0.67  (02/07/2010)   Serum potassium 3.9  (02/07/2010)    Hypertension flowsheet reviewed?: Yes   Progress toward BP goal: At goal  Self-Management Support :   Personal Goals (by the next clinic visit) :     Personal A1C goal: 7  (09/14/2009)     Personal blood pressure goal: 130/80  (09/14/2009)  Personal LDL goal: 100  (09/14/2009)    Patient will work on the following items until the next clinic visit to reach self-care goals:     Medications and monitoring: take my medicines every day, check my blood sugar, bring all of my medications to every visit, examine my feet every day  (08/20/2010)     Eating: eat more vegetables, use fresh or frozen vegetables, eat fruit for snacks and desserts, limit or avoid alcohol  (08/20/2010)     Activity: take a 30 minute walk every day  (06/03/2010)    Diabetes self-management support: Written self-care plan, Education handout, Resources for patients handout  (08/20/2010)   Diabetes care plan printed   Diabetes education handout printed   Last diabetes self-management training by diabetes educator: 06/03/2010    Hypertension self-management support: Written self-care plan, Education handout, Resources for patients handout  (08/20/2010)    Hypertension self-care plan printed.   Hypertension education handout printed    Lipid self-management support: Written self-care plan, Education handout, Resources for patients handout  (08/20/2010)   Lipid self-care plan printed.   Lipid education handout printed      Resource handout printed.   Influenza Vaccine    Vaccine Type: Fluvax Non-MCR    Site: right deltoid    Mfr: Gl[employer]oSmithK[employer]    Dose: 0.5 ml    Route: IM    Given by: Stanton Kidney Ditzler RN    Exp. Date: 06/21/2011    Lot #: [id number]    VIS given: 07/15/07 version given August 20, 2010.  Flu Vaccine Consent Questions    Do you have a history of severe allergic reactions to this vaccine? no    Any prior history of allergic reactions to egg and/or gelatin? no    Do you have a sensitivity to the preservative Thimersol? no    Do you have a past history of Guillan-Barre Syndrome? no    Do you currently have an acute febrile illness? no    Have you ever had a severe reaction to latex? no    Vaccine information given and explained to patient? yes    Are you currently pregnant? no   Laboratory Results   Blood Tests   Date/Time Received: August 20, 2010 4:31 PM Date/Time Reported: Alric Quan  August 20, 2010 4:31 PM   HGBA1C: 10.0%   (Normal Range: Non-Diabetic - 3-6%   Control Diabetic - 6-8%) CBG Random:: 245mg /dL

## 2011-01-21 NOTE — Progress Notes (Signed)
Summary: pain clinic referral/ hla  Phone Note Call from Patient   Reason for Call: Referral Summary of Call: pt called wants referral to pain management clinic ph # [phone] or [phone] Initial call taken by: Marin Roberts RN,  December 02, 2007 7:09 PM  Follow-up for Phone Call        She needs to come and talk to me about why she wants to go to the pain clinic. Maybe I can treat her here. Ask her to schedule an appointment to see me. Follow-up by: Peggye Pitt MD,  December 03, 2007 11:54 AM  Additional Follow-up for Phone Call Additional follow up Details #1::        Jan 24, 2008 Additional Follow-up by: Marin Roberts RN,  January 03, 2008 2:02 PM

## 2011-01-21 NOTE — Consult Note (Signed)
Summary: [facility]  [facility]   Imported By: Florinda Marker 12/06/2008 14:49:23  _____________________________________________________________________  External Attachment:    Type:   Image     Comment:   External Document  Appended Document: [facility]   Impression & Recommendations:  Problem # 1:  DYSPHAGIA UNSPECIFIED (ICD-787.20) Patient was evaluated by Dr. Elnoria Howard (GI doctor), diagnosis of dysphagia with GERD made. Patient was started on Omeprazole 40 mg by mouth once daily and EGD will be scheduled in 2 weeks. We will follow up on the results.

## 2011-01-21 NOTE — Progress Notes (Signed)
  Phone Note Outgoing Call   Call placed by: Theotis Barrio NT II,  October 31, 2010 10:30 AM Call placed to: Patient Details for Reason: [facility] Summary of Call: CALLED PATIENT, UNABLE TO LEAVE MESSAGE ON CELL PHONE.

## 2011-01-21 NOTE — Progress Notes (Signed)
Summary: metformin dose/dmr  Phone Note Call from Patient Call back at Home Phone [phone]   Caller: Patient Summary of Call: spoke iwhtpatient this morning who said she is tolerating the metfomriin now at 500 mg twice daily. decreased it to once daily for a few days, but htne increased it back to 1/2 tablet twice daily onher own. Reports feeling better and blood sugars are also better. Initial call taken by: [name] RD,CDE,  February 15, 2010 4:01 PM  Follow-up for Phone Call        great Follow-up by: Mliss Sax MD,  February 20, 2010 10:00 AM    New/Updated Medications: METFORMIN HCL 1000 MG TABS (METFORMIN HCL) Take 1/2 tablet by mouth two times a day

## 2011-01-21 NOTE — Assessment & Plan Note (Signed)
Summary: 2WK FU/EST/VS   Vital Signs:  Patient Profile:   50 Years Old Female Height:     66 inches (167.64 cm) Weight:      252.8 pounds (114.91 kg) BMI:     40.95 Temp:     96.4 degrees F (35.78 degrees C) oral Pulse rate:   59 / minute BP sitting:   124 / 81  (right arm) Cuff size:   large  Pt. in pain?   yes    Location:   back    Intensity:   10+    Type:       stinging / SHARP  Vitals Entered By: Theotis Barrio NT II (May 26, 2008 10:54 AM)              Is Patient Diabetic? Yes  Nutritional Status BMI of > 30 = obese  Have you ever been in a relationship where you felt threatened, hurt or afraid?No   Does patient need assistance? Functional Status Self care Ambulation Normal     PCP:  Peggye Pitt MD  Chief Complaint:  BACK PAIN / MEDICATION REFILL.  History of Present Illness: I brought her back today for 2 reasons: 1. Followup on BP after reinitiation of all of her BP meds and for a BMET. (She received money from the [facility] for this purpose).\par 2. To followup on her back pain. She wanted to think about pain meds, given this is our only option left. (see last note for details on back pain and what has been dome so far).    Prior Medication List:  ATENOLOL 100 MG TABS (ATENOLOL) Take 1 tablet by mouth once a day LISINOPRIL 20 MG TABS (LISINOPRIL) Take 1 tablet by mouth once a day HYDROCHLOROTHIAZIDE 25 MG TABS (HYDROCHLOROTHIAZIDE) Take 1 tablet by mouth once a day LYRICA 100 MG CAPS (PREGABALIN) Take 1 tablet by mouth three times a day ADVAIR DISKUS 250-50 MCG/DOSE MISC (FLUTICASONE-SALMETEROL) One puff two times a day ALBUTEROL 90 MCG/ACT AERS (ALBUTEROL) one puff every 6 hrs as needed for wheezing NOVOLOG MIX 70/30 70-30 % SUSP (INSULIN ASP PROT & ASP (HUM)) 120 units Subcutaneously every am NOVOLOG MIX 70/30 70-30 % SUSP (INSULIN ASP PROT & ASP (HUM)) 65 units subcutaneously every pm DARVOCET-N 100 100-650 MG  TABS (PROPOXYPHENE N-APAP) Take  1 -2 pills every 4 hours as needed for back pain. TYLENOL EXTRA STRENGTH 500 MG TABS (ACETAMINOPHEN) Take 1 tablet by mouth every 6 hours as needed for pain PRAVACHOL 20 MG  TABS (PRAVASTATIN SODIUM) Take 1 tab by mouth at bedtime PLAVIX 75 MG  TABS (CLOPIDOGREL BISULFATE) Take 1 tablet by mouth once a day LEXAPRO 10 MG  TABS (ESCITALOPRAM OXALATE) Take 1 tablet by mouth once a day   Current Allergies: ! MORPHINE SULFATE (MORPHINE SULFATE) ! * IODINATED CONTRAST  Past Medical History:    Reviewed history from 04/02/2007 and no changes required:       Current Problems:        ABSCESS, PERIRECTAL, HX OF (ICD-V13.3)       RENAL FAILURE, ACUTE, HX OF (ICD-V13.09)       TOBACCO ABUSE (ICD-305.1)           COPD (ICD-496)       HYPERLIPIDEMIA NEC/NOS (ICD-272.4)           TRANSAMINASES, SERUM, ELEVATED (ICD-790.4)       Diabetes mellitus type II           DIABETIC PERIPHERAL NEUROPATHY (  ICD-250.60)           DIABETIC FOOT ULCER, TOE (ICD-250.80)       HYPERTENSION, BENIGN ESSENTIAL (ICD-401.1)         Past Surgical History:    Reviewed history from 01/08/2007 and no changes required:       PTCA/stent, left iliac 2005       R shoulder arthroscropy/debridement       Hysterectomy, 1997 due to fibroids           Risk Factors:  Tobacco use:  current    Year started:  years Alcohol use:  no Exercise:  no Seatbelt use:  100 %  Mammogram History:    Date of Last Mammogram:  04/11/2008  PAP Smear History:    Date of Last PAP Smear:  08/17/2001   Review of Systems  The patient denies fever, weight loss, chest pain, dyspnea on exertion, abdominal pain, melena, hematochezia, and severe indigestion/heartburn.     Physical Exam  General:     Well-developed,well-nourished,in no acute distress; alert,appropriate and cooperative throughout examination Neck:     No bruits/goiter Lungs:     Normal respiratory effort, chest expands symmetrically. Lungs are clear to auscultation, no  crackles or wheezes. Heart:     Normal rate and regular rhythm. S1 and S2 normal without gallop, murmur, click, rub or other extra sounds. Extremities:     No clubbing, cyanosis, edema.    Impression & Recommendations:  Problem # 1:  HYPERTENSION, BENIGN ESSENTIAL (ICD-401.1) Excellent BP since restarting her meds. Will check BMET today to monitor renal function and electrolytes.  Her updated medication list for this problem includes:    Atenolol 100 Mg Tabs (Atenolol) .Marland Kitchen... Take 1 tablet by mouth once a day    Lisinopril 20 Mg Tabs (Lisinopril) .Marland Kitchen... Take 1 tablet by mouth once a day    Hydrochlorothiazide 25 Mg Tabs (Hydrochlorothiazide) .Marland Kitchen... Take 1 tablet by mouth once a day  BP today: 124/81 Prior BP: 170/104 (05/09/2008)  Labs Reviewed: Creat: 0.73 (03/01/2008) Chol: 165 (09/20/2007)   HDL: 45 (09/20/2007)   LDL: 110 (09/20/2007)   TG: 52 (09/20/2007)   Problem # 2:  BACK PAIN (ICD-724.5) She has decided to try vicodin. Will go ahead and have her sign a pain contract. She will get a UDS today. WIll have her followup in 1 month to see if there is any improvement.  Her updated medication list for this problem includes:    Darvocet-n 100 100-650 Mg Tabs (Propoxyphene n-apap) .Marland Kitchen... Take 1 -2 pills every 4 hours as needed for back pain.    Tylenol Extra Strength 500 Mg Tabs (Acetaminophen) .Marland Kitchen... Take 1 tablet by mouth every 6 hours as needed for pain    Vicodin 5-500 Mg Tabs (Hydrocodone-acetaminophen) .Marland Kitchen... Take 1 tablet every 6 hours as needed for pain.  Orders: T-Drug Screen-Urine, (single) [phone])   Complete Medication List: 1)  Atenolol 100 Mg Tabs (Atenolol) .... Take 1 tablet by mouth once a day 2)  Lisinopril 20 Mg Tabs (Lisinopril) .... Take 1 tablet by mouth once a day 3)  Hydrochlorothiazide 25 Mg Tabs (Hydrochlorothiazide) .... Take 1 tablet by mouth once a day 4)  Lyrica 100 Mg Caps (Pregabalin) .... Take 1 tablet by mouth three times a day 5)  Advair  Diskus 250-50 Mcg/dose Misc (Fluticasone-salmeterol) .... One puff two times a day 6)  Albuterol 90 Mcg/act Aers (Albuterol) .... One puff every 6 hrs as needed for wheezing 7)  Novolog Mix  70/30 70-30 % Susp (Insulin asp prot & asp (hum)) .Marland Kitchen.. 120 units subcutaneously every am 8)  Novolog Mix 70/30 70-30 % Susp (Insulin asp prot & asp (hum)) .... 65 units subcutaneously every pm 9)  Darvocet-n 100 100-650 Mg Tabs (Propoxyphene n-apap) .... Take 1 -2 pills every 4 hours as needed for back pain. 10)  Tylenol Extra Strength 500 Mg Tabs (Acetaminophen) .... Take 1 tablet by mouth every 6 hours as needed for pain 11)  Pravachol 20 Mg Tabs (Pravastatin sodium) .... Take 1 tab by mouth at bedtime 12)  Plavix 75 Mg Tabs (Clopidogrel bisulfate) .... Take 1 tablet by mouth once a day 13)  Lexapro 10 Mg Tabs (Escitalopram oxalate) .... Take 1 tablet by mouth once a day 14)  Vicodin 5-500 Mg Tabs (Hydrocodone-acetaminophen) .... Take 1 tablet every 6 hours as needed for pain.  Other Orders: T-Hgb A1C (in-house) ([id number])   Patient Instructions: 1)  Please schedule a follow-up appointment in 2 months.   Prescriptions: VICODIN 5-500 MG  TABS (HYDROCODONE-ACETAMINOPHEN) Take 1 tablet every 6 hours as needed for pain.  #120 x 1   Entered and Authorized by:   Peggye Pitt MD   Signed by:   Peggye Pitt MD on 05/26/2008   Method used:   Print then Give to Patient   RxID:   [id number]  ] Laboratory Results   Blood Tests   Date/Time Received: May 26, 2008 11:41 AM  Date/Time Reported: Oren Beckmann  May 26, 2008 11:41 AM   HGBA1C: 9.2%   (Normal Range: Non-Diabetic - 3-6%   Control Diabetic - 6-8%)

## 2011-01-21 NOTE — Progress Notes (Signed)
Summary: diabetes support/dmr  Phone Note Call from Patient Call back at Home Phone [phone]   Caller: Patient Summary of Call:  [facility] called about being out of Lyrica- informed her of phone note that indicates the needed papers were faxed last night.  She also scheduled an appointment with CDE for 5/18 @ 2:30 PM.   Initial call taken by: [name] RD,CDE,  May 02, 2010 1:36 PM

## 2011-01-21 NOTE — Progress Notes (Signed)
Summary: med refill/gp   Phone Note Refill Request Message from:  Patient on October 21, 2010 5:25 PM  Pt. request refill on Roney Jaffe she wants to restart and stop the Percecet.   Method Requested: Telephone to Pharmacy Initial call taken by: Chinita Pester RN,  October 21, 2010 5:25 PM  Follow-up for Phone Call        completed refill, thank you Cayce Paschal  Follow-up by: Mliss Sax MD,  October 23, 2010 12:40 PM    New/Updated Medications: TRAMADOL HCL 50 MG TABS (TRAMADOL HCL) take 1 tablet 2-3 times per day as needed for pain Prescriptions: TRAMADOL HCL 50 MG TABS (TRAMADOL HCL) take 1 tablet 2-3 times per day as needed for pain  #60 x 3   Entered and Authorized by:   Mliss Sax MD   Signed by:   Mliss Sax MD on 10/23/2010   Method used:   Electronically to        [facility] (retail)       [address]       [place], [place]  [address]       Ph: [phone]       Fax: [phone]   RxID:   [id number]   Appended Document: med refill/gp  Pt. was called and made awared of Tramadol rx at [facility] pharmacy.

## 2011-01-21 NOTE — Assessment & Plan Note (Signed)
Summary: CHECKUP/SB.   Vital Signs:  Patient profile:   50 year old female Height:      66 inches (167.64 cm) Weight:      243.6 pounds (110.73 kg) BMI:     39.46 Temp:     98.0 degrees F (36.67 degrees C) oral Pulse rate:   78 / minute BP sitting:   123 / 81  (right arm) Cuff size:   large  Vitals Entered By: Krystal Eaton Duncan Dull) (March 28, 2009 9:37 AM) CC: Depression, f/u diabetes Is Patient Diabetic? Yes  Pain Assessment Patient in pain? yes     Location: legs/fingers Intensity: 7 Type: aching Onset of pain  Chronic (neuropathy) Nutritional Status BMI of > 30 = obese  Have you ever been in a relationship where you felt threatened, hurt or afraid?No   Does patient need assistance? Functional Status Self care Ambulation Impaired:Risk for fall Comments uses a cane, routine f/u   Primary Care Provider:  Mliss Sax MD  CC:  Depression and f/u diabetes.  History of Present Illness: 50 yo female with DM2, HTN, HLD, chronic back pain came in for regular follow up. She is mostly cocnerned for her back pain, she has lost about 10 lbs (unintentional) but still reports same pain that has not been getting any better. Starts in the low back area (middle), and radiates down to both of her ankles and feet. It is aggrevated by walking, prolonged standing, relieved by rest. She has tried pysical therapy (on [address], in April and May last year) and pain medicines and nothing has helpped her. No other concerns, no chest pain, no shortness of breath, no recent changes in appetite, no abdominal or urinary concerns. She feels depressed since she has to support her granddaughters and her daughter-in-law just lost her job. She only gets <$20 in food stamps and she is struggling financially.   Depression History:      The patient is having a depressed mood most of the day and has a diminished interest in her usual daily activities.  The patient denies significant weight loss,  significant weight gain, insomnia, hypersomnia, fatigue (loss of energy), and feelings of worthlessness (guilt).        The patient denies that she feels like life is not worth living, denies that she wishes that she were dead, and denies that she has thought about ending her life.        Comments:  Pt has does experience times where she feels like life isnt worth living, and thoughts about her being dead.  unable to afford Lexapro, has not taken for the last month.  Preventive Screening-Counseling & Management     Alcohol drinks/day: 0     Smoking Status: current     Smoking Cessation Counseling: yes     Packs/Day: 1.5     Year Started: years     Passive Smoke Exposure: yes     Does Patient Exercise: no  Problems Prior to Update: 1)  Boils, Recurrent  (ICD-680.9) 2)  Nausea  (ICD-787.02) 3)  Weight Loss  (ICD-783.21) 4)  Dysphagia Unspecified  (ICD-787.20) 5)  Gastroesophageal Reflux Disease  (ICD-530.81) 6)  Diab W/neuro Manifests Type Ii/uns Not Uncntrl  (ICD-250.60) 7)  Ulcer of Other Part of Foot  (ICD-707.15) 8)  Other&unspecified Diseases The Oral Soft Tissues  (ICD-528.9) 9)  Abscess, Skin  (ICD-682.9) 10)  Depression  (ICD-311) 11)  Diabetic Foot Ulcer, Right  (ICD-250.80) 12)  Claudication  (ICD-443.9) 13)  Bell's Palsy  (ICD-351.0) 14)  Back Pain  (ICD-724.5) 15)  Health Screening  (ICD-V70.0) 16)  Dm W/manifestation Nec, Type II, Uncontrolled  (ICD-250.82) 17)  Abscess, Perirectal, Hx of  (ICD-V13.3) 18)  Renal Failure, Acute, Hx of  (ICD-V13.09) 19)  Tobacco Abuse  (ICD-305.1) 20)  COPD  (ICD-496) 21)  Hyperlipidemia Nec/nos  (ICD-272.4) 22)  Transaminases, Serum, Elevated  (ICD-790.4) 23)  Diabetic Peripheral Neuropathy  (ICD-250.60) 24)  Hypertension, Benign Essential  (ICD-401.1)  Medications Prior to Update: 1)  Atenolol 100 Mg Tabs (Atenolol) .... Take 1 Tablet By Mouth Once A Day 2)  Lisinopril 20 Mg Tabs (Lisinopril) .... Take 1 Tablet By Mouth Once A  Day 3)  Hydrochlorothiazide 25 Mg Tabs (Hydrochlorothiazide) .... Take 1 Tablet By Mouth Once A Day 4)  Advair Diskus 250-50 Mcg/dose Misc (Fluticasone-Salmeterol) .... One Puff Two Times A Day 5)  Novolog Mix 70/30 70-30 % Susp (Insulin Asp Prot & Asp (Hum)) .Marland Kitchen.. 120 Units Subcutaneously Every Am 6)  Novolog Mix 70/30 70-30 % Susp (Insulin Asp Prot & Asp (Hum)) .... 65 Units Subcutaneously Every Pm 7)  Tylenol Extra Strength 500 Mg Tabs (Acetaminophen) .... Take 1 Tablet By Mouth Every 6 Hours As Needed For Pain 8)  Pravachol 20 Mg  Tabs (Pravastatin Sodium) .... Take 1 Tab By Mouth At Bedtime 9)  Plavix 75 Mg  Tabs (Clopidogrel Bisulfate) .... Take 1 Tablet By Mouth Once A Day 10)  Lexapro 10 Mg  Tabs (Escitalopram Oxalate) .... Take 1 Tablet By Mouth Once A Day  Current Medications (verified): 1)  Atenolol 100 Mg Tabs (Atenolol) .... Take 1 Tablet By Mouth Once A Day 2)  Lisinopril 20 Mg Tabs (Lisinopril) .... Take 1 Tablet By Mouth Once A Day 3)  Hydrochlorothiazide 25 Mg Tabs (Hydrochlorothiazide) .... Take 1 Tablet By Mouth Once A Day 4)  Advair Diskus 250-50 Mcg/dose Misc (Fluticasone-Salmeterol) .... One Puff Two Times A Day 5)  Novolog Mix 70/30 70-30 % Susp (Insulin Asp Prot & Asp (Hum)) .Marland Kitchen.. 120 Units Subcutaneously Every Am 6)  Novolog Mix 70/30 70-30 % Susp (Insulin Asp Prot & Asp (Hum)) .... 65 Units Subcutaneously Every Pm 7)  Tylenol Extra Strength 500 Mg Tabs (Acetaminophen) .... Take 1 Tablet By Mouth Every 6 Hours As Needed For Pain 8)  Pravachol 20 Mg  Tabs (Pravastatin Sodium) .... Take 1 Tab By Mouth At Bedtime 9)  Plavix 75 Mg  Tabs (Clopidogrel Bisulfate) .... Take 1 Tablet By Mouth Once A Day 10)  Lexapro 10 Mg  Tabs (Escitalopram Oxalate) .... Take 1 Tablet By Mouth Once A Day  Allergies: 1)  ! Morphine Sulfate (Morphine Sulfate) 2)  ! * Iodinated Contrast  Past History:  Past Medical History:    Current Problems:     RENAL FAILURE, ACUTE, HX OF (ICD-V13.09)     TOBACCO ABUSE (ICD-305.1)        COPD (ICD-496)    HYPERLIPIDEMIA NEC/NOS (ICD-272.4)        TRANSAMINASES, SERUM, ELEVATED (ICD-790.4)    Diabetes mellitus type II        DIABETIC PERIPHERAL NEUROPATHY (ICD-250.60)        DIABETIC FOOT ULCER, TOE (ICD-250.80)    HYPERTENSION, BENIGN ESSENTIAL (ICD-401.1)     (11/24/2008)  Past Surgical History:    PTCA/stent, left iliac 2005    R shoulder arthroscropy/debridement    Hysterectomy, 1997 due to fibroids     (01/08/2007)  Family History:    Mother  DM2, CHF, living  Father asthma, living    Brothers and sisters asthma since childhood (06/22/2008)  Social History:    Lives with mom at home, currently unemployed (06/22/2008)  Risk Factors:    Alcohol Use: 0 (03/28/2009)    >5 drinks/d w/in last 3 months: N/A    Caffeine Use: N/A    Diet: N/A    Exercise: no (03/28/2009)  Risk Factors:    Smoking Status: current (03/28/2009)    Packs/Day: 1.5 (03/28/2009)    Cigars/wk: N/A    Pipe Use/wk: N/A    Cans of tobacco/wk: N/A    Passive Smoke Exposure: yes (03/28/2009)  Family History:    Reviewed history from 06/22/2008 and no changes required:       Mother  DM2, CHF, living       Father asthma, living       Brothers and sisters asthma since childhood  Social History:    Reviewed history from 06/22/2008 and no changes required:       Lives with daughter-in-law in apartment, currently unemployed  Review of Systems  The patient denies fever, chest pain, syncope, dyspnea on exertion, peripheral edema, prolonged cough, abdominal pain, hematuria, depression, and abnormal bleeding.    Physical Exam  General:  alert, well-developed, well-nourished, and well-hydrated.   Lungs:  normal respiratory effort, no intercostal retractions, no accessory muscle use, normal breath sounds, no crackles, and no wheezes.   Heart:  normal rate, regular rhythm, and no murmur.   Abdomen:  soft, non-tender, normal bowel sounds, no distention,  no masses, no rigidity, and no rebound tenderness.   Neurologic:  alert & oriented X3, cranial nerves II-XII intact, and strength normal in all extremities.   Skin:  turgor normal, color normal, no rashes, and no suspicious lesions.   Psych:  Oriented X3, memory intact for recent and remote, normally interactive, good eye contact, not anxious appearing, not depressed appearing, and not agitated.    Diabetes Management Exam:    Foot Exam (with socks and/or shoes not present):       Sensory-Pinprick/Light touch:          Left medial foot (L-4): diminished          Left dorsal foot (L-5): diminished          Left lateral foot (S-1): diminished          Right medial foot (L-4): diminished          Right dorsal foot (L-5): diminished          Right lateral foot (S-1): diminished       Sensory-Monofilament:          Left foot: diminished          Right foot: diminished       Inspection:          Left foot: normal          Right foot: normal       Nails:          Left foot: thickened          Right foot: thickened   Impression & Recommendations:  Problem # 1:  DM W/MANIFESTATION NEC, TYPE II, UNCONTROLLED (ICD-250.82) Not well controlled, patient is not checking CBG's regularly. Her HgBA1C is not at goal. Discussed with patient issues for noncompliance. She usually falls asleep and forgets to take the evening dose. If she wakes up at 2 am she does not take insulin since she thinks she will wait till the  morning time to take. She was advised to take it regularly and to make sure just to space it, every 12 hours, no matter what time she takes it. She did not know that so now I am hoping that she will be more compliant. In addition we have set the goal for her next HgBA1C (11.1, since it was 12.1 in 01/2009), and she has agreed  to avoid sodas and juices. I will see her in May again and at that time will set up an appointment with [name] for further education. I have also given her sheet to  monitor her CBG's for the next 2 weeks and she will bring her monitor next time so that we can review it and adjust the regimen accordingly.   Her updated medication list for this problem includes:    Novolog Mix 70/30 70-30 % Susp (Insulin asp prot & asp (hum)) .Marland Kitchen... 120 units subcutaneously every am    Novolog Mix 70/30 70-30 % Susp (Insulin asp prot & asp (hum)) .Marland KitchenMarland KitchenMarland KitchenMarland Kitchen 65 units subcutaneously every pm  Labs Reviewed: Creat: 0.84 (01/29/2009)     Last Eye Exam: No diabetic retinopathy.    (05/31/2008) Reviewed HgBA1c results: 12.1 (01/29/2009)  9.4 (09/26/2008)  Problem # 2:  TOBACCO ABUSE (ICD-305.1)  Still smokes, I have discussed this with her today again. She reports financial difficulties and I have told her that she could save some money if she stops smoking. She wants to quit but reports being depressed now due to her living situation and this prevents her from quitting. I have  discussed different methods for smoking cessation.   Problem # 3:  BACK PAIN (ICD-724.5) This is chronic problem for this patient and she refuses further PT since it never helped her (per her report). I encouraged her to stay active as much as she can since sedentary lifestyle can aggrevate her problem. She has agreed to do so and has agreed to make 3 circles around the block 4/7 days.  Her updated medication list for this problem includes:    Tylenol Extra Strength 500 Mg Tabs (Acetaminophen) .Marland Kitchen... Take 1 tablet by mouth every 6 hours as needed for pain  Problem # 4:  HYPERTENSION, BENIGN ESSENTIAL (ICD-401.1) Good control, continue the same regimen.   Her updated medication list for this problem includes:    Atenolol 100 Mg Tabs (Atenolol) .Marland Kitchen... Take 1 tablet by mouth once a day    Lisinopril 20 Mg Tabs (Lisinopril) .Marland Kitchen... Take 1 tablet by mouth once a day    Hydrochlorothiazide 25 Mg Tabs (Hydrochlorothiazide) .Marland Kitchen... Take 1 tablet by mouth once a day  BP today: 123/81 Prior BP: 115/77  (01/29/2009)  Labs Reviewed: K+: 4.3 (01/29/2009) Creat: : 0.84 (01/29/2009)   Chol: 171 (11/24/2008)   HDL: 30 (11/24/2008)   LDL: 79 (11/24/2008)   TG: 310 (11/24/2008)  Problem # 5:  HYPERLIPIDEMIA NEC/NOS (ICD-272.4) Will repeat FLP on her next appointment.  Her updated medication list for this problem includes:    Pravachol 20 Mg Tabs (Pravastatin sodium) .Marland Kitchen... Take 1 tab by mouth at bedtime  Labs Reviewed: SGOT: 21 (01/29/2009)   SGPT: 45 (01/29/2009)   HDL:30 (11/24/2008), 31 (06/22/2008)  LDL:79 (11/24/2008), 122 (32/44/0102)  Chol:171 (11/24/2008), 183 (06/22/2008)  Trig:310 (11/24/2008), 150 (06/22/2008)  Complete Medication List: 1)  Atenolol 100 Mg Tabs (Atenolol) .... Take 1 tablet by mouth once a day 2)  Lisinopril 20 Mg Tabs (Lisinopril) .... Take 1 tablet by mouth once a day 3)  Hydrochlorothiazide 25 Mg Tabs (Hydrochlorothiazide) .... Take 1 tablet by mouth once a day 4)  Advair Diskus 250-50 Mcg/dose Misc (Fluticasone-salmeterol) .... One puff two times a day 5)  Novolog Mix 70/30 70-30 % Susp (Insulin asp prot & asp (hum)) .Marland Kitchen.. 120 units subcutaneously every am 6)  Novolog Mix 70/30 70-30 % Susp (Insulin asp prot & asp (hum)) .... 65 units subcutaneously every pm 7)  Tylenol Extra Strength 500 Mg Tabs (Acetaminophen) .... Take 1 tablet by mouth every 6 hours as needed for pain 8)  Pravachol 20 Mg Tabs (Pravastatin sodium) .... Take 1 tab by mouth at bedtime 9)  Plavix 75 Mg Tabs (Clopidogrel bisulfate) .... Take 1 tablet by mouth once a day 10)  Lexapro 10 Mg Tabs (Escitalopram oxalate) .... Take 1 tablet by mouth once a day 11)  Ra Pen Needles 31g X 8 Mm Misc (Insulin pen needle) .... Use 3 times per day to check the sugar level  Patient Instructions: 1)  Please schedule a follow-up appointment in 2 months. 2)  Check your blood sugars regularly. If your readings are usually above 350 : or below 70 you should contact our office. 3)  It is important that your Diabetic  A1c level is checked every 3 months. 4)  Check your feet each night for sore areas, calluses or signs of infection. 5)  Check your Blood Pressure regularly. If it is above 170: you should make an appointment. Prescriptions: RA PEN NEEDLES 31G X 8 MM MISC (INSULIN PEN NEEDLE) use 3 times per day to check the sugar level  #90 x 3   Entered and Authorized by:   Mliss Sax MD   Signed by:   Mliss Sax MD on 03/28/2009   Method used:   Print then Give to Patient   RxID:   [id number]

## 2011-01-21 NOTE — Miscellaneous (Signed)
Summary: HIPAA Restrictions  HIPAA Restrictions   Imported By: Florinda Marker 01/29/2009 14:46:39  _____________________________________________________________________  External Attachment:    Type:   Image     Comment:   External Document

## 2011-01-21 NOTE — Progress Notes (Signed)
Summary: med change/gp  Phone Note Refill Request Message from:  Fax from Pharmacy on August 08, 2010 12:00 PM  Refills Requested: Medication #1:  LISINOPRIL 20 MG TABS Take 1 tablet by mouth once a day [facility] pharmacy can provide Accupril 10mg  daily at no cost to the pt. if you would consider changing ACE-I. Need new Rx.   Method Requested: Telephone to Pharmacy Initial call taken by: Chinita Pester RN,  August 08, 2010 12:00 PM  Follow-up for Phone Call        completed refill, thank you Dalina Samara  Follow-up by: Mliss Sax MD,  August 08, 2010 12:45 PM    New/Updated Medications: ACCUPRIL 20 MG TABS (QUINAPRIL HCL) Take 1 tablet by mouth once a day Prescriptions: ACCUPRIL 20 MG TABS (QUINAPRIL HCL) Take 1 tablet by mouth once a day  #30 x 5   Entered and Authorized by:   Mliss Sax MD   Signed by:   Mliss Sax MD on 08/08/2010   Method used:   Faxed to ...       [facility] Department (retail)       [address] [place], [place]  [address]       Ph: [phone]       Fax: [phone]   RxID:   [id number]

## 2011-01-21 NOTE — Progress Notes (Signed)
  Phone Note Call from Patient   Caller: Patient Reason for Call: Acute Illness Summary of Call: pt calls c/o numbness from r chin, mouth moving up toward r eye- this started mon and is getting progressively worse. spoke w/ dr Lyda Perone, pt is to come in asap and will be seen, spoke w/ pt, she cannot come in until 2:15 Initial call taken by: Marin Roberts RN,  August 11, 2007 11:21 AM

## 2011-01-21 NOTE — Letter (Signed)
Summary: A1C  A1C   Imported By: Florinda Marker 03/28/2009 14:35:39  _____________________________________________________________________  External Attachment:    Type:   Image     Comment:   External Document

## 2011-01-21 NOTE — Assessment & Plan Note (Signed)
Summary: np/shoulder arthritis/eo   Vital Signs:  Patient profile:   50 year old female Height:      66.6 inches Weight:      244.13 pounds Pulse rate:   69 / minute BP sitting:   160 / 89  (right arm)  Vitals Entered By: Terese Door (July 09, 2010 8:46 AM) CC: Left shoulder pain Is Patient Diabetic? Yes Pain Assessment Patient in pain? yes     Location: shoulder Intensity: 9 Type: sharp/aching   Primary Care Provider:  Mliss Sax MD   History of Present Illness:  L ad cap  very pleasant 50 year old female with a six-month history of shoulder pain, intermittent without a history of clear injury. The patient is diabetic. She is on insulin. She describes a dull ache in her lateral shoulder. No radiculopathy with some neck and shoulder blade pain.  no history of dislocation or subluxation.  X-rays, left shoulder independently reviewed. There is evidence of some mild acromioclavicular osteoarthritic changes, but there is no glenohumeral osteoarthritis. No evidence of fracture or dislocation.   Allergies: 1)  ! Morphine Sulfate (Morphine Sulfate) 2)  ! * Iodinated Contrast  Past History:  Past medical, surgical, family and social histories (including risk factors) reviewed, and no changes noted (except as noted below).  Past Medical History: Reviewed history from 11/30/2009 and no changes required. 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 PTCA/stent, left iliac 2005 R shoulder arthroscropy/debridement Hysterectomy, 1997 due to fibroids Wrist surgery Breast surgery  Family History: Reviewed history from 06/28/2010 and no changes required. Mother  DM2, CHF, living Father asthma, living Brothers and sisters asthma since childhood Younger sister had heart attack at age of 61, since then she is doing well.   Social History: Reviewed history from 11/30/2009 and no changes required. Lives with daughter-in-law in apartment, currently unemployed Divorced  Tobacco Use - Yes.  Alcohol Use - no  Review of Systems       no fever, chills, sweats. No tingling.  Physical Exam  General:  Well-developed,well-nourished,in no acute distress; alert,appropriate and cooperative throughout examination Head:  Normocephalic and atraumatic without obvious abnormalities. No apparent alopecia or balding. Ears:  no external deformities.   Nose:  no external deformity.   Lungs:  normal respiratory effort.   Msk:  left shoulder: Abduction to approximately 85, loss of approximately 10 of external rotation and 90 of internal rotation compared to the contralateral side.  Cuff testing yields intact strength. All strength testing is 4+/5.  Positive a.c. crossover compression test. Mildly tender at a.c. joint. All rotator cuff special testing is equivocal given loss of motion.   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 Diabetes mellitus is a significant risk factor. No history of trauma. Rotator cuff is intact.  Patient was given a systematic ROM protocol from [facility] to be done daily. Emphasized that without adherence to HEP, likely  will not get better.  NSAIDs  are contraindicated given Plavix. Note that lisinopril was not a contraindication to NSAID use.  Patient will be sent for formal PT for aggressive frozen shoulder ROM. Will need RTC str and scapular stabilization to fix underlying mechanics.  Intrarticular Shoulder Injection Verbal consent was obtained from the patient. Risks, benefits, and alternatives were explained. Patient prepped with Betadine and Ethyl Chloride used for anesthesia. An intraarticular shoulder injection was performed using the posterior approach. The patient tolerated the procedure well and had decreased pain post injection. No complications. Injection: 9 cc of Lidcocaine 1% and 1cc of Kenalog 40 mg. Needle: 22 gauge   Orders: Joint Aspirate / Injection, Large (20610) Kenalog 10mg  (4units) ([id number])  Complete Medication List: 1)  Atenolol 100 Mg Tabs (Atenolol) .... Take 1 tablet by mouth once a day 2)  Lisinopril 20 Mg Tabs (Lisinopril) .... Take 1 tablet by mouth once a day 3)  Hydrochlorothiazide 25 Mg Tabs (Hydrochlorothiazide) .... Take 1 tablet by mouth once a day 4)  Advair Diskus 250-50 Mcg/dose Misc (Fluticasone-salmeterol) .... One puff two times a day 5)  Plavix 75 Mg Tabs (Clopidogrel bisulfate) .... Take 1 tablet by mouth once a day 6)  Ra Pen Needles 31g X 8 Mm Misc (Insulin pen needle) .... Use 3 times per day to check the sugar level 7)  Lyrica 100 Mg Caps (Pregabalin) .... Take 1 tablet by mouth three times a day 8)  Tramadol Hcl 50 Mg Tabs (Tramadol hcl) .... One tab up to four times a day for pain 9)  Humalog Mix 75/25 Pen 75-25 % Susp (Insulin lispro prot & lispro) .... Inject under the skin 110 units in the am and 55 units in the evening every day 10)  Truetrack Test Strp (Glucose blood) .... Use to check blood sugar twice daily 11)  Lancets Misc (Lancets) .... Use to check blood sugar twice daily 12)  Nitrostat 0.4 Mg Subl (Nitroglycerin) .... Take one tablet under your  tongue with severe chest pain, repeat in 5 minutes. attend to doctor/hospital if had to take more then 3 in 20 minutes 13)  Diflucan 100 Mg Tabs (Fluconazole) .... Take 1 tablet by mouth two times a day 14)  Lexapro 10 Mg Tabs (Escitalopram oxalate) .... Take 1 tablet by mouth once a day 15)  Hydrocodone-acetaminophen 5-325 Mg Tabs (Hydrocodone-acetaminophen) .... Take 1 tablet by mouth two times a day as needed for pain.  Patient Instructions: 1)  f/u 6 weeks

## 2011-01-21 NOTE — Assessment & Plan Note (Signed)
Summary: CHECKUP/ SB.   Vital Signs:  Patient Profile:   50 Years Old Female Height:     66 inches (167.64 cm) Weight:      251.6 pounds Temp:     97.8 degrees F oral Pulse rate:   80 / minute BP sitting:   120 / 80  (right arm)  Pt. in pain?   yes    Location:   back    Intensity:   10    Type:       aching  Vitals Entered ByFilomena Jungling (November 04, 2007 10:53 AM)              Is Patient Diabetic? Yes  CBG Result 95  Does patient need assistance? Functional Status Self care Ambulation Normal     PCP:  Peggye Pitt MD  Chief Complaint:  CHEK-UP-NEED REFILLS.  History of Present Illness: Jennifer [name] is here today for a scheduled appointment. She had her arteriogram and right iliac stent placement 2 weeks ago for her claudication. Her leg pain has COMPLETELY resolved. Now her main problem is her back pain. She had an MRI back in 06/08, which showed facet arthropathy. She has taken ibuprofen and naprosyn without relief.  Current Allergies: ! MORPHINE SULFATE (MORPHINE SULFATE) ! * IODINATED CONTRAST    Risk Factors: Tobacco use:  current    Year started:  years Alcohol use:  no Exercise:  no Seatbelt use:  100 %  Mammogram History:    Date of Last Mammogram:  07/26/2004  PAP Smear History:    Date of Last PAP Smear:  08/17/2001   Review of Systems  The patient denies fever, weight loss, chest pain, dyspnea on exhertion, abdominal pain, melena, hematochezia, and severe indigestion/heartburn.     Physical Exam  General:     Well-developed,well-nourished,in no acute distress; alert,appropriate and cooperative throughout examination Eyes:     No corneal or conjunctival inflammation noted. EOMI. Perrla.  Vision grossly normal. Neck:     No bruits/goiter. Lungs:     Normal respiratory effort, chest expands symmetrically. Lungs are clear to auscultation, no crackles or wheezes. Heart:     Normal rate and regular rhythm. S1 and S2 normal without  gallop, murmur, click, rub or other extra sounds. Abdomen:     Bowel sounds positive,abdomen soft and non-tender without masses, organomegaly or hernias noted. Extremities:     Positive pedal pulses bilaterally.    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 Is now on plavix. Will see Dr. Darrick Penna back in followup.  Her updated medication list for this problem includes:    Plavix 75 Mg Tabs (Clopidogrel bisulfate) .Marland Kitchen... Take 1 tablet by mouth once a day  Orders: T-Basic Metabolic Panel ([id number])   Problem # 2:  BACK PAIN (ICD-724.5) Given severe facet arthropathy seen on MRI scan and no relief with NSAIDS, I will send to the pain clinic for steroid injections.  Her updated medication list for this problem includes:    Naprosyn 500 Mg Tabs (Naproxen) .Marland Kitchen... Take 1 tablet by mouth two times a day    Tylenol Extra Strength 500 Mg Tabs (Acetaminophen) .Marland Kitchen... Take 1 tablet by mouth every 6 hours as needed for pain    Ibuprofen 600 Mg Tabs (Ibuprofen) .Marland Kitchen... Take 1 tablet every 8 hours as needed for pain.  Orders: T-Basic Metabolic Panel [phone] [id number]) Pain Clinic Referral (Pain)   Problem # 3:  DM W/MANIFESTATION NEC, TYPE II, UNCONTROLLED (ICD-250.82) A1C improving. Continue 70/30 insulin. Has not brought in her glucometer today.  Her updated medication list for this problem includes:    Lisinopril 20 Mg Tabs (Lisinopril) .Marland Kitchen... Take 1 tablet by mouth once a day    Novolog Mix 70/30 70-30 % Susp (Insulin asp prot & asp (hum)) .Marland KitchenMarland KitchenMarland KitchenMarland Kitchen 100 units subcutaneously every am    Novolog Mix 70/30 70-30 % Susp (Insulin asp prot & asp (hum)) .Marland KitchenMarland KitchenMarland KitchenMarland Kitchen 50 units subcutaneously every pm  Orders: T- Capillary Blood Glucose (95621) T-Hgb A1C (in-house) ([id number])  Labs Reviewed: HgBA1c: 7.9 (11/04/2007)   Creat: 0.61 (09/13/2007)      Problem # 4:  TOBACCO ABUSE (ICD-305.1) She has finally  decided that she wants to quit smoking but needs help. She has tried the patches before with no success. She has set her quit date. Will prescribe chantix and will see her back in 1 month for followup.  Her updated medication list for this problem includes:    Chantix Starting Month Pak 0.5 Mg X 11 & 1 Mg X 42 Misc (Varenicline tartrate) .Marland Kitchen... Take as directed.    Chantix Continuing Month Pak 1 Mg Tabs (Varenicline tartrate) .Marland Kitchen... Take as directed.  Orders: T-Basic Metabolic Panel [phone])   Problem # 5:  HYPERTENSION, BENIGN ESSENTIAL (ICD-401.1) BP with excellent control. Have congratulated her. Will continue current regimen.  Her updated medication list for this problem includes:    Atenolol 100 Mg Tabs (Atenolol) .Marland Kitchen... Take 1 tablet by mouth once a day    Lisinopril 20 Mg Tabs (Lisinopril) .Marland Kitchen... Take 1 tablet by mouth once a day    Hydrochlorothiazide 25 Mg Tabs (Hydrochlorothiazide) .Marland Kitchen... Take 1 tablet by mouth once a day  Orders: T-Basic Metabolic Panel [phone])  BP today: 120/80 Prior BP: 165/89 (09/29/2007)  Labs Reviewed: Creat: 0.61 (09/13/2007) Chol: 165 (09/20/2007)   HDL: 45 (09/20/2007)   LDL: 110 (09/20/2007)   TG: 52 (09/20/2007)   Problem # 7:  HYPERLIPIDEMIA NEC/NOS (ICD-272.4) She has not been taking the pravastatin because she had lost her prescription. Will restart today.  Her updated medication list for this problem includes:    Pravachol 20 Mg Tabs (Pravastatin sodium) .Marland Kitchen... Take 1 tab by mouth at bedtime  Labs Reviewed: Chol: 165 (09/20/2007)   HDL: 45 (09/20/2007)   LDL: 110 (09/20/2007)   TG: 52 (09/20/2007) SGOT: 20 (09/13/2007)   SGPT: 27 (09/13/2007)   Problem # 8:  HEALTH SCREENING (ICD-V70.0) Will need to discuss at next visit.  Complete Medication List: 1)  Atenolol 100 Mg Tabs (Atenolol) .... Take 1 tablet by mouth once a day 2)  Lisinopril 20 Mg Tabs (Lisinopril) .... Take 1 tablet by mouth once a day 3)  Hydrochlorothiazide 25  Mg Tabs (Hydrochlorothiazide) .... Take 1 tablet by mouth once a day 4)  Lyrica 100 Mg Caps (Pregabalin) .... Take 1 tablet by mouth three times a day 5)  Advair Diskus 250-50 Mcg/dose Misc (Fluticasone-salmeterol) .... One puff two times a day 6)  Albuterol 90 Mcg/act Aers (Albuterol) .... One puff every 6 hrs as needed for wheezing 7)  Novolog Mix 70/30 70-30 % Susp (Insulin asp prot & asp (hum)) .Marland Kitchen.. 100 units subcutaneously every am 8)  Novolog Mix 70/30 70-30 % Susp (Insulin asp prot & asp (hum)) .... 50 units subcutaneously every pm 9)  Naprosyn 500 Mg Tabs (Naproxen) .... Take 1 tablet by mouth two times a day 10)  Tylenol Extra Strength 500 Mg Tabs (Acetaminophen) .Marland KitchenMarland KitchenMarland Kitchen  Take 1 tablet by mouth every 6 hours as needed for pain 11)  Ibuprofen 600 Mg Tabs (Ibuprofen) .... Take 1 tablet every 8 hours as needed for pain. 12)  Pravachol 20 Mg Tabs (Pravastatin sodium) .... Take 1 tab by mouth at bedtime 13)  Chantix Starting Month Pak 0.5 Mg X 11 & 1 Mg X 42 Misc (Varenicline tartrate) .... Take as directed. 14)  Chantix Continuing Month Pak 1 Mg Tabs (Varenicline tartrate) .... Take as directed. 15)  Plavix 75 Mg Tabs (Clopidogrel bisulfate) .... Take 1 tablet by mouth once a day   Patient Instructions: 1)  Please schedule a follow-up appointment in 1 month.    Prescriptions: CHANTIX CONTINUING MONTH PAK 1 MG  TABS (VARENICLINE TARTRATE) Take as directed.  #1 x 1   Entered and Authorized by:   Peggye Pitt MD   Signed by:   Peggye Pitt MD on 11/04/2007   Method used:   Print then Give to Patient   RxID:   [id number] CHANTIX STARTING MONTH PAK 0.5 MG X 11 & 1 MG X 42  MISC (VARENICLINE TARTRATE) Take as directed.  #1 x 0   Entered and Authorized by:   Peggye Pitt MD   Signed by:   Peggye Pitt MD on 11/04/2007   Method used:   Print then Give to Patient   RxID:   [id number] NAPROSYN 500 MG TABS (NAPROXEN) Take 1 tablet by mouth two times a day  #62 x 2    Entered and Authorized by:   Peggye Pitt MD   Signed by:   Peggye Pitt MD on 11/04/2007   Method used:   Print then Give to Patient   RxID:   [id number] PRAVACHOL 20 MG  TABS (PRAVASTATIN SODIUM) Take 1 tab by mouth at bedtime  #31 x 5   Entered and Authorized by:   Peggye Pitt MD   Signed by:   Peggye Pitt MD on 11/04/2007   Method used:   Print then Give to Patient   RxID:   [id number]  ]  Vital Signs:  Patient Profile:   50 Years Old Female Height:     66 inches (167.64 cm) Weight:      251.6 pounds Temp:     97.8 degrees F oral Pulse rate:   80 / minute BP sitting:   120 / 80    Location:   back    Intensity:   10    Type:       aching             CBG Result 95 Last PAP Result Normal PapHx  Normal (08/17/2001 6:26:06 PM)       Laboratory Results   Blood Tests   Date/Time Recieved: November 04, 2007 11:12 AM  Date/Time Reported: ..................................................................Marland KitchenOren Beckmann  November 04, 2007 11:12 AM   HGBA1C: 7.9%   (Normal Range: Non-Diabetic - 3-6%   Control Diabetic - 6-8%) CBG Random: 95

## 2011-01-21 NOTE — Assessment & Plan Note (Signed)
Summary: CHECKUP/SB.   Vital Signs:  Patient profile:   50 year old female Height:      66 inches (167.64 cm) Weight:      250.3 pounds (113.77 kg) BMI:     40.55 Temp:     97.0 degrees F oral Pulse rate:   70 / minute BP sitting:   126 / 73  (right arm)  Vitals Entered By: Chinita Pester RN (June 03, 2010 2:26 PM) CC: Pain left shoulder x " a few month"., Depression Is Patient Diabetic? Yes Did you bring your meter with you today? No Pain Assessment Patient in pain? yes     Location: left shoulder Intensity: 4 Type: aching Onset of pain  Intermittent Nutritional Status BMI of > 30 = obese CBG Result 386  Have you ever been in a relationship where you felt threatened, hurt or afraid?Unable to ask; somone w/pt.   Does patient need assistance? Functional Status Self care Ambulation Impaired:Risk for fall Comments using a cane.   Primary Care Provider:  Mliss Sax MD  CC:  Pain left shoulder x " a few month". and Depression.  History of Present Illness: 51 yo female with PMH outlined below presents to [facility] for regular follow up appointment. Her main concern today is left shoulder pain that started 1 week ago and has been getting worse. She has limited ROM and is not able to lift any objects. She denies any trauma to it and no fever or chills. She has no other concerns at the time. No recent sicknesses or hospitalizaitons. No episodes of chest pain, SOB, palpitations. No specific abdominal or urinary concerns. No recent changes in appetite, weight, sleep patterns, mood.    Depression History:      The patient is having a depressed mood most of the day and has a diminished interest in her usual daily activities.  The patient denies significant weight loss, significant weight gain, insomnia, hypersomnia, psychomotor agitation, psychomotor retardation, fatigue (loss of energy), feelings of worthlessness (guilt), impaired concentration (indecisiveness), and recurrent thoughts  of death or suicide.        The patient denies that she feels like life is not worth living, denies that she wishes that she were dead, and denies that she has thought about ending her life.         Preventive Screening-Counseling & Management  Alcohol-Tobacco     Alcohol drinks/day: 0     Smoking Status: current     Smoking Cessation Counseling: yes     Packs/Day: 1 ppd     Year Started: years     Passive Smoke Exposure: yes  Caffeine-Diet-Exercise     Does Patient Exercise: no  Problems Prior to Update: 1)  Hyperlipidemia Nec/nos  (ICD-272.4) 2)  Hypertension, Benign Essential  (ICD-401.1) 3)  Hypertension  (ICD-401.9) 4)  Chest Pain  (ICD-786.50) 5)  Insomnia  (ICD-780.52) 6)  Candidiasis, Vaginal  (ICD-112.1) 7)  Hip Pain, Right  (ICD-719.45) 8)  Boils, Recurrent  (ICD-680.9) 9)  Diab W/neuro Manifests Type Ii/uns Not Uncntrl  (ICD-250.60) 10)  Depression  (ICD-311) 11)  Diabetic Foot Ulcer, Right  (ICD-250.80) 12)  Claudication  (ICD-443.9) 13)  Dm W/manifestation Nec, Type II, Uncontrolled  (ICD-250.82) 14)  Tobacco Abuse  (ICD-305.1) 15)  COPD  (ICD-496) 16)  Transaminases, Serum, Elevated  (ICD-790.4) 17)  Diabetic Peripheral Neuropathy  (ICD-250.60) 18)  Knee Pain  (ICD-719.46) 19)  Fracture, Toe, Left  (ICD-826.0) 20)  Diabetic Foot Ulcer, Left  (ICD-250.80)  21)  Inadequate Material Resources  (ICD-V60.2) 22)  Nausea  (ICD-787.02) 23)  Weight Loss  (ICD-783.21) 24)  Dysphagia Unspecified  (ICD-787.20) 25)  Gastroesophageal Reflux Disease  (ICD-530.81) 26)  Ulcer of Other Part of Foot  (ICD-707.15) 27)  Other&unspecified Diseases The Oral Soft Tissues  (ICD-528.9) 28)  Abscess, Skin  (ICD-682.9) 29)  Bell's Palsy  (ICD-351.0) 30)  Back Pain  (ICD-724.5) 31)  Health Screening  (ICD-V70.0) 32)  Abscess, Perirectal, Hx of  (ICD-V13.3) 33)  Renal Failure, Acute, Hx of  (ICD-V13.09) 34)  Glaucoma  (ICD-365.9) 35)  Disc Disease, Lumbar   (ICD-722.52)  Medications Prior to Update: 1)  Atenolol 100 Mg Tabs (Atenolol) .... Take 1 Tablet By Mouth Once A Day 2)  Lisinopril 20 Mg Tabs (Lisinopril) .... Take 1 Tablet By Mouth Once A Day 3)  Hydrochlorothiazide 25 Mg Tabs (Hydrochlorothiazide) .... Take 1 Tablet By Mouth Once A Day 4)  Advair Diskus 250-50 Mcg/dose Misc (Fluticasone-Salmeterol) .... One Puff Two Times A Day 5)  Tylenol Extra Strength 500 Mg Tabs (Acetaminophen) .... Take 1 Tablet By Mouth Every 6 Hours As Needed For Pain 6)  Plavix 75 Mg  Tabs (Clopidogrel Bisulfate) .... Take 1 Tablet By Mouth Once A Day 7)  Ra Pen Needles 31g X 8 Mm Misc (Insulin Pen Needle) .... Use 3 Times Per Day To Check The Sugar Level 8)  Amitriptyline Hcl 75 Mg Tabs (Amitriptyline Hcl) .... Take 1 Tablet By Mouth Two Times A Day 9)  Lyrica 100 Mg Caps (Pregabalin) .... Take 1 Tablet By Mouth Three Times A Day 10)  Tramadol Hcl 50 Mg Tabs (Tramadol Hcl) .... One Tab Up To Four Times A Day For Pain 11)  Humalog Mix 75/25 Pen 75-25 % Susp (Insulin Lispro Prot & Lispro) .... Inject Under The Skin 110 Units in The Am and 55 Units in The Evening Every Day 12)  Truetrack Test  Strp (Glucose Blood) .... Use To Check Blood Sugar Twice Daily 13)  Lancets  Misc (Lancets) .... Use To Check Blood Sugar Twice Daily 14)  Nitrostat 0.4 Mg Subl (Nitroglycerin) .... Take One Tablet Under Your Tongue With Severe Chest Pain, Repeat in 5 Minutes. Attend To Doctor/hospital If Had To Take More Then 3 in 20 Minutes 15)  Lidoderm 5 % Ptch (Lidocaine) .... Apply To The Affected Area, 12 Hours On and 12 Hours Off 16)  Trazodone Hcl 50 Mg Tabs (Trazodone Hcl) .... Take 1 Tab By Mouth At Bedtime As Needed To Fall Sleep. 17)  Metformin Hcl 1000 Mg Tabs (Metformin Hcl) .... Take 1/2 Tablet By Mouth Two Times A Day 18)  Diflucan 100 Mg Tabs (Fluconazole) .... Take 1 Tablet By Mouth Two Times A Day  Current Medications (verified): 1)  Atenolol 100 Mg Tabs (Atenolol) ....  Take 1 Tablet By Mouth Once A Day 2)  Lisinopril 20 Mg Tabs (Lisinopril) .... Take 1 Tablet By Mouth Once A Day 3)  Hydrochlorothiazide 25 Mg Tabs (Hydrochlorothiazide) .... Take 1 Tablet By Mouth Once A Day 4)  Advair Diskus 250-50 Mcg/dose Misc (Fluticasone-Salmeterol) .... One Puff Two Times A Day 5)  Tylenol Extra Strength 500 Mg Tabs (Acetaminophen) .... Take 1 Tablet By Mouth Every 6 Hours As Needed For Pain 6)  Plavix 75 Mg  Tabs (Clopidogrel Bisulfate) .... Take 1 Tablet By Mouth Once A Day 7)  Ra Pen Needles 31g X 8 Mm Misc (Insulin Pen Needle) .... Use 3 Times Per Day To Check The Sugar Level  8)  Amitriptyline Hcl 75 Mg Tabs (Amitriptyline Hcl) .... Take 1 Tablet By Mouth Two Times A Day 9)  Lyrica 100 Mg Caps (Pregabalin) .... Take 1 Tablet By Mouth Three Times A Day 10)  Tramadol Hcl 50 Mg Tabs (Tramadol Hcl) .... One Tab Up To Four Times A Day For Pain 11)  Humalog Mix 75/25 Pen 75-25 % Susp (Insulin Lispro Prot & Lispro) .... Inject Under The Skin 110 Units in The Am and 55 Units in The Evening Every Day 12)  Truetrack Test  Strp (Glucose Blood) .... Use To Check Blood Sugar Twice Daily 13)  Lancets  Misc (Lancets) .... Use To Check Blood Sugar Twice Daily 14)  Nitrostat 0.4 Mg Subl (Nitroglycerin) .... Take One Tablet Under Your Tongue With Severe Chest Pain, Repeat in 5 Minutes. Attend To Doctor/hospital If Had To Take More Then 3 in 20 Minutes 15)  Lidoderm 5 % Ptch (Lidocaine) .... Apply To The Affected Area, 12 Hours On and 12 Hours Off 16)  Trazodone Hcl 50 Mg Tabs (Trazodone Hcl) .... Take 1 Tab By Mouth At Bedtime As Needed To Fall Sleep. 17)  Metformin Hcl 1000 Mg Tabs (Metformin Hcl) .... Take 1/2 Tablet By Mouth Two Times A Day 18)  Diflucan 100 Mg Tabs (Fluconazole) .... Take 1 Tablet By Mouth Two Times A Day  Allergies (verified): 1)  ! Morphine Sulfate (Morphine Sulfate) 2)  ! 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   Social History: Last updated: 11/30/2009 Lives with daughter-in-law in apartment, currently unemployed Divorced  Tobacco Use - Yes.  Alcohol Use - no  Risk Factors: Alcohol Use: 0 (06/03/2010) Exercise: no (06/03/2010)  Risk Factors: Smoking Status: current (06/03/2010) Packs/Day: 1 ppd (06/03/2010) Passive Smoke Exposure: yes (06/03/2010)  Family History: Reviewed history from 11/23/2009 and no changes required. Mother  DM2, CHF, living Father asthma, living Brothers and sisters asthma since childhood Younger sister had heart attack at age of 5, since then she is doing well.   Social History: Reviewed history from 11/30/2009 and no changes required. Lives with daughter-in-law in apartment, currently unemployed Divorced  Tobacco Use - Yes.  Alcohol Use -  no  Review of Systems       per HPI  Physical Exam  General:  Well-developed,well-nourished,in no acute distress; alert,appropriate and cooperative throughout examination Lungs:  Normal respiratory effort, chest expands symmetrically. Lungs are clear to auscultation, no crackles or wheezes. Heart:  Normal rate and regular rhythm. S1 and S2 normal without gallop, murmur, click, rub or other extra sounds. Abdomen:  Bowel sounds positive,abdomen soft and non-tender without masses, organomegaly or hernias noted.  Shoulder/Elbow Exam  Shoulder Exam:    Right:    Inspection:  Normal    Palpation:  Normal    Stability:  stable    Tenderness:  no    Swelling:  no    Erythema:  no    Left:    Inspection:  Normal    Palpation:  Normal    Stability:  stable    Tenderness:  right bicipital groove    Swelling:  no    Erythema:  no    Can not abduct the shoulder above 90 degree angle   Impression & Recommendations:  Problem # 1:  HYPERLIPIDEMIA NEC/NOS (ICD-272.4) Does not want to take cholesterol medication, wants to continue diet regimen. Will reasses on her next appointment and will readjust the regimen.  Labs Reviewed: SGOT: 37 (02/07/2010)   SGPT: 39 (02/07/2010)   HDL:35 (02/07/2010), 30 (11/24/2008)  LDL:109 (02/07/2010), 79 (16/09/9603)  Chol:173 (02/07/2010), 171 (11/24/2008)  Trig:145 (02/07/2010), 310 (11/24/2008)  Problem # 2:  HYPERTENSION, BENIGN ESSENTIAL (ICD-401.1) At goal, will cont the same regimen. Her updated medication list for this problem includes:    Atenolol 100 Mg Tabs (Atenolol) .Marland Kitchen... Take 1 tablet by mouth once a day    Lisinopril 20 Mg Tabs (Lisinopril) .Marland Kitchen... Take 1 tablet by mouth once a day    Hydrochlorothiazide 25 Mg Tabs (Hydrochlorothiazide) .Marland Kitchen... Take 1 tablet by mouth once a day  BP today: 126/73 Prior BP: 128/72 (04/01/2010)  Labs Reviewed: K+: 3.9 (02/07/2010) Creat: : 0.67 (02/07/2010)   Chol: 173 (02/07/2010)   HDL: 35 (02/07/2010)    LDL: 109 (02/07/2010)   TG: 145 (02/07/2010)  Problem # 3:  DM W/MANIFESTATION NEC, TYPE II, UNCONTROLLED (ICD-250.82) Could not find the meter today, she has an appointment with [name] today and will follow up onher recommendations. A1C is worsening and she has history of these ups and downs depending on her compliance. This was discussed today and she admits she needs to get back on track. Will cont to follow up on this issue.  Her updated medication list for this problem includes:    Lisinopril 20 Mg Tabs (Lisinopril) .Marland Kitchen... Take 1 tablet by mouth once a day    Humalog Mix 75/25 Pen 75-25 % Susp ..... Inject under the skin 110 units in the am and 55 units in the evening every day    Metformin Hcl 1000 Mg Tabs (Metformin hcl) .Marland Kitchen... Take 1/2 tablet by mouth two times a day  Labs Reviewed: Creat: 0.67 (02/07/2010)     Last Eye Exam: No diabetic retinopathy.    (05/31/2008) Reviewed HgBA1c results: 12.5 (05/08/2010)  9.8 (02/07/2010)  Problem # 4:  TOBACCO ABUSE (ICD-305.1)  Encouraged smoking cessation and discussed different methods for smoking cessation.   Problem # 5:  SHOULDER PAIN, LEFT (ICD-719.41)  Will obtain xray of the left shoulder and will follow up. Cont the pain medicine in the mean time.  Her updated medication list for this problem includes:    Tylenol Extra Strength 500 Mg Tabs (Acetaminophen) .Marland Kitchen... Take 1 tablet by mouth every 6 hours as needed for pain    Tramadol Hcl 50 Mg Tabs (Tramadol hcl) ..... One tab up to four times a day for pain    Percocet 5-325 Mg Tabs (Oxycodone-acetaminophen) .Marland Kitchen... Take 1 tablet twice daily as needed for pain  Orders: Radiology other (Radiology Other)  Complete Medication List: 1)  Atenolol 100 Mg Tabs (Atenolol) .... Take 1 tablet by mouth once a day 2)  Lisinopril 20 Mg Tabs (Lisinopril) .... Take 1 tablet by mouth once a day 3)  Hydrochlorothiazide 25 Mg Tabs (Hydrochlorothiazide) .... Take 1 tablet by mouth once a day 4)  Advair  Diskus 250-50 Mcg/dose Misc (Fluticasone-salmeterol) .... One puff two times a day 5)  Tylenol Extra Strength 500 Mg Tabs (Acetaminophen) .... Take 1 tablet by mouth every 6 hours as needed for pain 6)  Plavix 75 Mg Tabs (Clopidogrel bisulfate) .... Take 1 tablet by mouth once a day 7)  Ra Pen Needles 31g X 8 Mm Misc (Insulin pen needle) .... Use 3 times per day to check the sugar level 8)  Amitriptyline Hcl 75 Mg Tabs (Amitriptyline hcl) .... Take 1 tablet by mouth two times a day 9)  Lyrica 100 Mg Caps (Pregabalin) .... Take 1 tablet by mouth three times a day 10)  Tramadol Hcl 50 Mg Tabs (Tramadol hcl) .... One tab up to four times a day for pain 11)  Humalog Mix 75/25 Pen 75-25 % Susp (Insulin lispro prot & lispro) .... Inject under the skin 110 units in the am and 55 units in the evening every day 12)  Truetrack Test Strp (Glucose blood) .... Use to check blood sugar twice daily 13)  Lancets Misc (Lancets) .... Use to check blood sugar twice daily 14)  Nitrostat 0.4 Mg Subl (Nitroglycerin) .... Take one tablet under your tongue with severe chest pain, repeat in 5 minutes. attend to doctor/hospital if had to take more then 3 in 20 minutes 15)  Lidoderm 5 % Ptch (Lidocaine) .... Apply to the affected area, 12 hours on and 12 hours off 16)  Trazodone Hcl 50 Mg Tabs (Trazodone hcl) .... Take 1 tab by mouth at bedtime as needed to fall sleep. 17)  Metformin Hcl 1000 Mg Tabs (Metformin hcl) .... Take 1/2 tablet by mouth two times a day 18)  Diflucan 100 Mg Tabs (Fluconazole) .... Take 1 tablet by mouth two times a day 19)  Percocet 5-325 Mg Tabs (Oxycodone-acetaminophen) .... Take 1 tablet twice daily as needed for pain  Other Orders: Capillary Blood Glucose/CBG (16109)  Patient Instructions: 1)  Please schedule a follow-up appointment in 3 months. 2)  Please check your blood pressure regularly, if it is >170 please call clinic at [phone] 3)  Please check your sugar levels regularly and  remember to bring the meter with you to the next clinic appointment, if the sugars are > 350 or < 60 please call us at [phone] Prescriptions: PERCOCET 5-325 MG TABS (OXYCODONE-ACETAMINOPHEN) take 1 tablet twice daily as needed for pain  #30 x 0   Entered and Authorized by:   Mliss Sax MD   Signed by:   Mliss Sax MD on 06/03/2010   Method used:   Print then Give to Patient   RxID:   [id number]   Prevention & Chronic Care Immunizations   Influenza vaccine: Not documented   Influenza vaccine deferral: Not indicated  (04/01/2010)    Tetanus booster: Not documented   Td booster deferral: Not indicated  (10/25/2009)    Pneumococcal vaccine: Not documented  Colorectal Screening   Hemoccult: Not documented   Hemoccult action/deferral: Not indicated  (10/25/2009)    Colonoscopy: Not documented   Colonoscopy action/deferral: Not indicated  (10/25/2009)  Other Screening   Pap smear: Normal  (08/17/2001)   Pap smear action/deferral: Not indicated-other  (04/01/2010)    Mammogram: No specific mammographic evidence of malignancy.  Assessment: BIRADS 1.   (04/11/2008)   Mammogram  action/deferral: Deferred-2 yr interval  (06/03/2010)   Mammogram due: 11/09/2009   Smoking status: current  (06/03/2010)   Smoking cessation counseling: yes  (06/03/2010)  Diabetes Mellitus   HgbA1C: 12.5  (05/08/2010)   HgbA1C action/deferral: Ordered  (09/14/2009)    Eye exam: No diabetic retinopathy.     (05/31/2008)   Diabetic eye exam action/deferral: Refused  (04/01/2010)   Eye exam due: 05/2009    Foot exam: yes  (10/25/2009)   High risk foot: Not documented   Foot care education: Not documented    Urine microalbumin/creatinine ratio: 9.4  (09/14/2009)   Urine microalbumin action/deferral: Ordered    Diabetes flowsheet reviewed?: Yes   Progress toward A1C goal: Deteriorated  Lipids   Total Cholesterol: 173  (02/07/2010)   LDL: 109  (02/07/2010)   LDL Direct: Not  documented   HDL: 35  (02/07/2010)   Triglycerides: 145  (02/07/2010)    SGOT (AST): 37  (02/07/2010)   SGPT (ALT): 39  (02/07/2010)   Alkaline phosphatase: 85  (02/07/2010)   Total bilirubin: 0.3  (02/07/2010)    Lipid flowsheet reviewed?: Yes   Progress toward LDL goal: Unchanged  Hypertension   Last Blood Pressure: 126 / 73  (06/03/2010)   Serum creatinine: 0.67  (02/07/2010)   Serum potassium 3.9  (02/07/2010)    Hypertension flowsheet reviewed?: Yes   Progress toward BP goal: At goal  Self-Management Support :   Personal Goals (by the next clinic visit) :     Personal A1C goal: 7  (09/14/2009)     Personal blood pressure goal: 130/80  (09/14/2009)     Personal LDL goal: 100  (09/14/2009)    Patient will work on the following items until the next clinic visit to reach self-care goals:     Medications and monitoring: take my medicines every day, bring all of my medications to every visit, examine my feet every day  (06/03/2010)     Eating: drink diet soda or water instead of juice or soda, eat more vegetables, use fresh or frozen vegetables, eat foods that are low in salt, eat baked foods instead of fried foods, eat fruit for snacks and desserts  (06/03/2010)     Activity: take a 30 minute walk every day  (06/03/2010)    Diabetes self-management support: Written self-care plan  (06/03/2010)   Diabetes care plan printed   Last diabetes self-management training by diabetes educator: 02/07/2010    Hypertension self-management support: Written self-care plan  (06/03/2010)   Hypertension self-care plan printed.    Lipid self-management support: Written self-care plan  (06/03/2010)   Lipid self-care plan printed.    Appended Document: CHECKUP/SB. [facility] brochure/telephone # given to pt. yesterday, in order for  her to call and make an appt. (per Dorothe Pea),

## 2011-01-21 NOTE — Progress Notes (Signed)
Summary: refill/gg  Phone Note Refill Request  on July 24, 2010 11:37 AM  Refills Requested: Medication #1:  TRAMADOL HCL 50 MG TABS One tab up to four times a day for pain  Method Requested: Electronic Initial call taken by: Merrie Roof RN,  July 24, 2010 11:37 AM  Follow-up for Phone Call        completed refill, thank you Ashly Goethe  Follow-up by: Mliss Sax MD,  July 24, 2010 3:47 PM    Prescriptions: TRAMADOL HCL 50 MG TABS (TRAMADOL HCL) One tab up to four times a day for pain  #80 x 7   Entered and Authorized by:   Mliss Sax MD   Signed by:   Mliss Sax MD on 07/24/2010   Method used:   Electronically to        [facility] (retail)       [address]       [place], [place]  [address]       Ph: [phone]       Fax: [phone]   RxID:   [id number]

## 2011-01-21 NOTE — Assessment & Plan Note (Signed)
Summary: EST-CK/FU/MEDS/CFB   Vital Signs:  Patient profile:   50 year old female Height:      66 inches Weight:      246.02 pounds Temp:     97.3 degrees F oral Pulse rate:   79 / minute BP sitting:   119 / 79  (right arm)  Vitals Entered By: Angelina Ok RN (July 02, 2009 2:19 PM) CC: Depression Is Patient Diabetic? Yes  Pain Assessment Patient in pain? yes     Location: legs, feet, fingers Intensity: 10 Type: aching Onset of pain  Constant Nutritional Status BMI of > 30 = obese CBG Result 207  Have you ever been in a relationship where you felt threatened, hurt or afraid?No   Does patient need assistance? Functional Status Self care Ambulation Impaired:Risk for fall Comments Pain in legs, hand and feet. Swelling in feet and legs.  Needs refill on depression medication and pain med.  Went to Urgent Care for a broken toe.  Got a referral to an Orthopaedic doctor unable to go.   Primary Care Provider:  Mliss Sax MD  CC:  Depression.  History of Present Illness: 50 years old with pmh of DM with peripheral neuropathy, diabetes uncontrolled and questionable compliance, HTN presents for follow up after ER visit. She had injured her left 5th toe with deep cut between 4th and 5th toe in web area. She did not realize her injury due to neuropathy and noticed it after bleeding marks on floor. She then went to ED and was found to have hairline fracture of 5th toe that was stiched. She was referred to ortho but she can not afford to see orthopedic surgeon and does not have insurance. She took Keflex for five days.   She notices discharge from area- on and off. It is mainly pus. She denies fever, nausea, vomiting, diaphoresis. She has noticed progressive swelling in area. It is minimally painful.   She is quite bitter with the fact that she has not had lyrica for sometime. She was on MAP program and we are attempting to sort out issues related to prescription.      Depression  History:      The patient denies a depressed mood most of the day and a diminished interest in her usual daily activities.        Comments:  Meds are helping.   Prevention & Chronic Care Immunizations   Influenza vaccine: Not documented    Tetanus booster: Not documented    Pneumococcal vaccine: Not documented  Colorectal Screening   Hemoccult: Not documented    Colonoscopy: Not documented  Other Screening   Pap smear: Normal  (08/17/2001)    Mammogram: No specific mammographic evidence of malignancy.  Assessment: BIRADS 1.   (04/11/2008)   Mammogram due: 04/2009    Smoking status: current  (07/02/2009)   Smoking cessation counseling: yes  (07/02/2009)  Diabetes Mellitus   HgbA1C: 11.8  (05/28/2009)    Eye exam: No diabetic retinopathy.     (05/31/2008)   Eye exam due: 05/2009    Foot exam: yes  (03/28/2009)   High risk foot: Not documented   Foot care education: Not documented    Urine microalbumin/creatinine ratio: 1.7  (09/29/2007)  Hypertension   Last Blood Pressure: 119 / 79  (07/02/2009)   Serum creatinine: 0.84  (01/29/2009)   Serum potassium 4.3  (01/29/2009)  Lipids   Total Cholesterol: 171  (11/24/2008)   LDL: 79  (11/24/2008)   LDL Direct:  Not documented   HDL: 30  (11/24/2008)   Triglycerides: 310  (11/24/2008)    SGOT (AST): 21  (01/29/2009)   SGPT (ALT): 45  (01/29/2009)   Alkaline phosphatase: 109  (01/29/2009)   Total bilirubin: 0.4  (01/29/2009)  Self-Management Support :    Diabetes self-management support: Not documented    Hypertension self-management support: Not documented    Lipid self-management support: Not documented    Preventive Screening-Counseling & Management  Alcohol-Tobacco     Alcohol drinks/day: 0     Smoking Status: current     Smoking Cessation Counseling: yes     Packs/Day: 0.5     Year Started: years     Passive Smoke Exposure: yes  Comments: Cut back on smoking due to cost.  Current Medications  (verified): 1)  Atenolol 100 Mg Tabs (Atenolol) .... Take 1 Tablet By Mouth Once A Day 2)  Lisinopril 20 Mg Tabs (Lisinopril) .... Take 1 Tablet By Mouth Once A Day 3)  Hydrochlorothiazide 25 Mg Tabs (Hydrochlorothiazide) .... Take 1 Tablet By Mouth Once A Day 4)  Advair Diskus 250-50 Mcg/dose Misc (Fluticasone-Salmeterol) .... One Puff Two Times A Day 5)  Novolog Mix 70/30 70-30 % Susp (Insulin Asp Prot & Asp (Hum)) .Marland Kitchen.. 120 Units Subcutaneously Every Am 6)  Novolog Mix 70/30 70-30 % Susp (Insulin Asp Prot & Asp (Hum)) .... 65 Units Subcutaneously Every Pm 7)  Tylenol Extra Strength 500 Mg Tabs (Acetaminophen) .... Take 1 Tablet By Mouth Every 6 Hours As Needed For Pain 8)  Pravachol 20 Mg  Tabs (Pravastatin Sodium) .... Take 1 Tab By Mouth At Bedtime 9)  Plavix 75 Mg  Tabs (Clopidogrel Bisulfate) .... Take 1 Tablet By Mouth Once A Day 10)  Paxil 20 Mg Tabs (Paroxetine Hcl) .... Take 1 Tablet By Mouth Once A Day 11)  Ra Pen Needles 31g X 8 Mm Misc (Insulin Pen Needle) .... Use 3 Times Per Day To Check The Sugar Level 12)  Lyrica 50 Mg Caps (Pregabalin) .... Take 1 Capsule By Mouth Three Times A Day 13)  Cleocin 150 Mg Caps (Clindamycin Hcl) .... Take Two Tablets Three Times A Day 14)  Cipro 500 Mg Tabs (Ciprofloxacin Hcl) .... Take 1 Tablet By Mouth Two Times A Day 15)  Amitriptyline Hcl 75 Mg Tabs (Amitriptyline Hcl) .... Take 1 Tablet By Mouth Two Times A Day  Allergies (verified): 1)  ! Morphine Sulfate (Morphine Sulfate) 2)  ! * Iodinated Contrast  Past History:  Past Medical History: Last updated: 11/24/2008 Current Problems:  RENAL FAILURE, ACUTE, HX OF (ICD-V13.09) TOBACCO ABUSE (ICD-305.1)     COPD (ICD-496) HYPERLIPIDEMIA NEC/NOS (ICD-272.4)     TRANSAMINASES, SERUM, ELEVATED (ICD-790.4) Diabetes mellitus type II     DIABETIC PERIPHERAL NEUROPATHY (ICD-250.60)     DIABETIC FOOT ULCER, TOE (ICD-250.80) HYPERTENSION, BENIGN ESSENTIAL (ICD-401.1)  Past Surgical  History: Last updated: 01/08/2007 PTCA/stent, left iliac 2005 R shoulder arthroscropy/debridement Hysterectomy, 1997 due to fibroids  Family History: Last updated: 06/22/2008 Mother  DM2, CHF, living Father asthma, living Brothers and sisters asthma since childhood  Social History: Last updated: 03/28/2009 Lives with daughter-in-law in apartment, currently unemployed  Risk Factors: Alcohol Use: 0 (07/02/2009) Exercise: no (05/28/2009)  Risk Factors: Smoking Status: current (07/02/2009) Packs/Day: 0.5 (07/02/2009) Passive Smoke Exposure: yes (07/02/2009)  Social History: Packs/Day:  0.5  Review of Systems      See HPI  Physical Exam  General:  overweight-appearing.   Head:  atraumatic.   Eyes:  vision  grossly intact.   Ears:  no external deformities.   Nose:  no external deformity.   Mouth:  Fair dentition, MMM. OP clear. Neck:  supple, full ROM, and no masses.   Lungs:  Normal respiratory effort, chest expands symmetrically. Lungs are clear to auscultation, no crackles or wheezes. Heart:  Normal rate and regular rhythm. S1 and S2 normal without gallop, murmur, click, rub or other extra sounds. Abdomen:  soft and non-tender.   Msk:  No deformity or scoliosis noted of thoracic or lumbar spine.   Extremities:  approximately 3cm wide 1 -2 cm deep wound on left 4th and 5th toe web. sutures are loose. cavity is lined with white fibrous tissue. minimal discharge. sorrounding swelling extending upto ankle but no erythema or warmth felt. no pus expressed. probing resulted into bloody dry base. probe did not touch bone. pulses in both legs are normal.  Neurologic:  No cranial nerve deficits noted. Station and gait are normal. Plantar reflexes are down-going bilaterally. DTRs are symmetrical throughout.  motor and coordinative functions appear intact. Sensory deficit in both legs and arms.  Skin:  No lesions Psych:  depressed affect.  Pt denies SI when asked directly.  Denies  HI.   Impression & Recommendations:  Problem # 1:  DIABETIC FOOT ULCER, LEFT (ICD-250.80) Assessment New Case discussed with Dr. Lurene Shadow and patient examined togather. She does have significant risk of progressive diabetic ulcer that can require extensive debridement. At present, we feel that she can be improved with good diabetes control, 14 days of cipro and clinda, close follow up and wound care. Appropriate steps were taken. She is instructed to return if fevers, nausea, vomiting were noted. She is asked to contact the clinic if she has difficulty getting antibiotics. Patient voices understanding.   Her updated medication list for this problem includes:    Lisinopril 20 Mg Tabs (Lisinopril) .Marland Kitchen... Take 1 tablet by mouth once a day    Novolog Mix 70/30 70-30 % Susp (Insulin asp prot & asp (hum)) .Marland Kitchen... 120 units subcutaneously every am    Novolog Mix 70/30 70-30 % Susp (Insulin asp prot & asp (hum)) .Marland KitchenMarland KitchenMarland KitchenMarland Kitchen 65 units subcutaneously every pm  Orders: T-Culture, Wound ([id number]) Wound Care Center Referral (Wound Care)  Problem # 2:  DIAB W/NEURO MANIFESTS TYPE II/UNS NOT UNCNTRL (ICD-250.60) Assessment: Unchanged Poorly controlled diabetes with poor compliance. Did not change dose because while she is "getting better" at taking insulin she is not taking it regularly. We have once again reinforced need ot taken insulin regularly, she voices understanding.  Her updated medication list for this problem includes:    Lisinopril 20 Mg Tabs (Lisinopril) .Marland Kitchen... Take 1 tablet by mouth once a day    Novolog Mix 70/30 70-30 % Susp (Insulin asp prot & asp (hum)) .Marland Kitchen... 120 units subcutaneously every am    Novolog Mix 70/30 70-30 % Susp (Insulin asp prot & asp (hum)) .Marland KitchenMarland KitchenMarland KitchenMarland Kitchen 65 units subcutaneously every pm  Labs Reviewed: Creat: 0.84 (01/29/2009)     Last Eye Exam: No diabetic retinopathy.    (05/31/2008) Reviewed HgBA1c results: 11.8 (05/28/2009)  12.1 (01/29/2009)  Problem # 3:   HYPERLIPIDEMIA NEC/NOS (ICD-272.4) Assessment: Unchanged LDL at goal. Continue therapy.  Her updated medication list for this problem includes:    Pravachol 20 Mg Tabs (Pravastatin sodium) .Marland Kitchen... Take 1 tab by mouth at bedtime  Labs Reviewed: SGOT: 21 (01/29/2009)   SGPT: 45 (01/29/2009)   HDL:30 (11/24/2008), 31 (06/22/2008)  LDL:79 (11/24/2008), 122 (24/40/1027)  Chol:171 (  11/24/2008), 183 (06/22/2008)  Trig:310 (11/24/2008), 150 (06/22/2008)  Problem # 4:  DIABETIC PERIPHERAL NEUROPATHY (ICD-250.60) Paperwork given to fill out for lyrica. At present started on amytryptiline.    Her updated medication list for this problem includes:    Lisinopril 20 Mg Tabs (Lisinopril) .Marland Kitchen... Take 1 tablet by mouth once a day    Novolog Mix 70/30 70-30 % Susp (Insulin asp prot & asp (hum)) .Marland Kitchen... 120 units subcutaneously every am    Novolog Mix 70/30 70-30 % Susp (Insulin asp prot & asp (hum)) .Marland KitchenMarland KitchenMarland KitchenMarland Kitchen 65 units subcutaneously every pm  Problem # 5:  COPD (ICD-496) Assessment: Unchanged stable at present.  Her updated medication list for this problem includes:    Advair Diskus 250-50 Mcg/dose Misc (Fluticasone-salmeterol) ..... One puff two times a day  Complete Medication List: 1)  Atenolol 100 Mg Tabs (Atenolol) .... Take 1 tablet by mouth once a day 2)  Lisinopril 20 Mg Tabs (Lisinopril) .... Take 1 tablet by mouth once a day 3)  Hydrochlorothiazide 25 Mg Tabs (Hydrochlorothiazide) .... Take 1 tablet by mouth once a day 4)  Advair Diskus 250-50 Mcg/dose Misc (Fluticasone-salmeterol) .... One puff two times a day 5)  Novolog Mix 70/30 70-30 % Susp (Insulin asp prot & asp (hum)) .Marland Kitchen.. 120 units subcutaneously every am 6)  Novolog Mix 70/30 70-30 % Susp (Insulin asp prot & asp (hum)) .... 65 units subcutaneously every pm 7)  Tylenol Extra Strength 500 Mg Tabs (Acetaminophen) .... Take 1 tablet by mouth every 6 hours as needed for pain 8)  Pravachol 20 Mg Tabs (Pravastatin sodium) .... Take 1 tab by mouth  at bedtime 9)  Plavix 75 Mg Tabs (Clopidogrel bisulfate) .... Take 1 tablet by mouth once a day 10)  Paxil 20 Mg Tabs (Paroxetine hcl) .... Take 1 tablet by mouth once a day 11)  Ra Pen Needles 31g X 8 Mm Misc (Insulin pen needle) .... Use 3 times per day to check the sugar level 12)  Lyrica 50 Mg Caps (Pregabalin) .... Take 1 capsule by mouth three times a day 13)  Cleocin 150 Mg Caps (Clindamycin hcl) .... Take two tablets three times a day 14)  Cipro 500 Mg Tabs (Ciprofloxacin hcl) .... Take 1 tablet by mouth two times a day 15)  Amitriptyline Hcl 75 Mg Tabs (Amitriptyline hcl) .... Take 1 tablet by mouth two times a day  Other Orders: Capillary Blood Glucose/CBG (84132)  Patient Instructions: 1)  Follow up either on this Thursday or Friday 2)  Take your antibiotics as prescribed.  3)  If you have trouble getting your antibiotics, call us. Prescriptions: AMITRIPTYLINE HCL 75 MG TABS (AMITRIPTYLINE HCL) Take 1 tablet by mouth two times a day  #60 x 0   Entered by:   Clerance Lav MD   Authorized by:   Mliss Sax MD   Signed by:   Clerance Lav MD on 07/02/2009   Method used:   Print then Give to Patient   RxID:   [id number] CIPRO 500 MG TABS (CIPROFLOXACIN HCL) Take 1 tablet by mouth two times a day  #28 x 0   Entered by:   Clerance Lav MD   Authorized by:   Mliss Sax MD   Signed by:   Clerance Lav MD on 07/02/2009   Method used:   Print then Give to Patient   RxID:   [id number] CLEOCIN 150 MG CAPS (CLINDAMYCIN HCL) Take two tablets three times a day  #84 x 0  Entered by:   Clerance Lav MD   Authorized by:   Mliss Sax MD   Signed by:   Clerance Lav MD on 07/02/2009   Method used:   Print then Give to Patient   RxID:   [id number]

## 2011-01-21 NOTE — Assessment & Plan Note (Signed)
Summary: HFU-PER DR PEACOCK/CFB   Vital Signs:  Patient Profile:   50 Years Old Female Height:     66 inches (167.64 cm) Weight:      249.5 pounds (113.41 kg) BMI:     40.42 Temp:     98.1 degrees F (36.72 degrees C) oral Pulse rate:   80 / minute BP sitting:   130 / 80  (right arm)  Pt. in pain?   no  Vitals Entered By: Filomena Jungling (April 06, 2008 2:03 PM)              Is Patient Diabetic? Yes   Have you ever been in a relationship where you felt threatened, hurt or afraid?No   Does patient need assistance? Functional Status Self care Ambulation Impaired:Risk for fall     PCP:  Peggye Pitt MD   History of Present Illness: Jennifer [name] has several active issues today: 1. She is feeling very depressed and has anhedonia; feels like she doesn't enjoy anything anymore, is having trouble sleeping. Has a lot of financial restraints, her son is going back to prison. She had been on lexapro before and it worked for her....wonders if I could renew her prescription. 2. Just discharge from the hospital with a diabetic foot ulcer. No evodence of osteomyelitis as the wound wasn't deep enough. She is finishing her course of bactrim. Wants to know for how much longer she needs to wear the orthopedic boot. 3. Still is having a lot of back pain...facet injections help, but don't completely eliminate the pain. She does not want to be on pain medications. 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 Has never been to physical therapy. 4. Wants to know if she qualifies for disability.    Prior Medication List:  ATENOLOL 100 MG TABS (ATENOLOL) Take 1 tablet by mouth once a day LISINOPRIL 20 MG TABS (LISINOPRIL) Take 1 tablet by mouth once a day HYDROCHLOROTHIAZIDE 25 MG TABS (HYDROCHLOROTHIAZIDE) Take 1 tablet by mouth once a day LYRICA 100 MG CAPS (PREGABALIN) Take 1 tablet by mouth three times a day ADVAIR DISKUS 250-50 MCG/DOSE MISC (FLUTICASONE-SALMETEROL) One puff two times a  day ALBUTEROL 90 MCG/ACT AERS (ALBUTEROL) one puff every 6 hrs as needed for wheezing NOVOLOG MIX 70/30 70-30 % SUSP (INSULIN ASP PROT & ASP (HUM)) 110 units Subcutaneously every am NOVOLOG MIX 70/30 70-30 % SUSP (INSULIN ASP PROT & ASP (HUM)) 60 units subcutaneously every pm DARVOCET-N 100 100-650 MG  TABS (PROPOXYPHENE N-APAP) Take 1 -2 pills every 4 hours as needed for back pain. TYLENOL EXTRA STRENGTH 500 MG TABS (ACETAMINOPHEN) Take 1 tablet by mouth every 6 hours as needed for pain PRAVACHOL 20 MG  TABS (PRAVASTATIN SODIUM) Take 1 tab by mouth at bedtime PLAVIX 75 MG  TABS (CLOPIDOGREL BISULFATE) Take 1 tablet by mouth once a day   Current Allergies: ! MORPHINE SULFATE (MORPHINE SULFATE) ! * IODINATED CONTRAST    Risk Factors: Tobacco use:  current    Year started:  years Alcohol use:  no Exercise:  no Seatbelt use:  100 %  Mammogram History:    Date of Last Mammogram:  07/26/2004  PAP Smear History:    Date of Last PAP Smear:  08/17/2001   Review of Systems  The patient denies fever, weight loss, chest pain, dyspnea on exhertion, abdominal pain, melena, hematochezia, and severe indigestion/heartburn.     Physical Exam  General:     Well-developed,well-nourished,in no acute distress; alert,appropriate and cooperative throughout examination Neck:  No bruits/goiter Lungs:     Normal respiratory effort, chest expands symmetrically. Lungs are clear to auscultation, no crackles or wheezes. Heart:     Normal rate and regular rhythm. S1 and S2 normal without gallop, murmur, click, rub or other extra sounds. Extremities:     No clubbing, cyanosis, edema. Orthopedic boot in place. Ulcer looks clean with nice, red granulation tissue.    Impression & Recommendations:  Problem # 1:  DEPRESSION (ICD-311) I will start her on lexapro again as this seemed to work for her in the past.  Her updated medication list for this problem includes:    Lexapro 10 Mg Tabs  (Escitalopram oxalate) .Marland Kitchen... Take 1 tablet by mouth once a day   Problem # 2:  DIABETIC FOOT ULCER, RIGHT (ICD-250.80) Healing well. She cannot afford diabetic shoes, but I have seen her wear good shoes with ample toe room. I have again asked her to examine her feet every day before she goes to bed to make sure she is not developing any blisters or sores. She is finishing her course of TMP-SMX.  Her updated medication list for this problem includes:    Lisinopril 20 Mg Tabs (Lisinopril) .Marland Kitchen... Take 1 tablet by mouth once a day    Novolog Mix 70/30 70-30 % Susp (Insulin asp prot & asp (hum)) .Marland Kitchen... 120 units subcutaneously every am    Novolog Mix 70/30 70-30 % Susp (Insulin asp prot & asp (hum)) .Marland KitchenMarland KitchenMarland KitchenMarland Kitchen 65 units subcutaneously every pm   Problem # 3:  BACK PAIN (ICD-724.5) This is one of her major concerns. She is reluctant to using narcotics. We have tried NSAIDS and muscle relaxers in the past. At the pain clinic she is currently receiving facet injections. Will refer her to physical therapy to see if they have any ideas to help improve her pain.  Her updated medication list for this problem includes:    Darvocet-n 100 100-650 Mg Tabs (Propoxyphene n-apap) .Marland Kitchen... Take 1 -2 pills every 4 hours as needed for back pain.    Tylenol Extra Strength 500 Mg Tabs (Acetaminophen) .Marland Kitchen... Take 1 tablet by mouth every 6 hours as needed for pain  Orders: Physical Therapy Referral (PT)   Problem # 4:  HEALTH SCREENING (ICD-V70.0) Mammogram scheduled today. Has had a complete hysterectomy. Almost at age for colon cancer screening.  Orders: Mammogram (Screening) (Mammo)   Problem # 5:  DM W/MANIFESTATION NEC, TYPE II, UNCONTROLLED (ICD-250.82) Have increased her 70/30 insulin today to 120 units in the am and 65 units in the pain.Microalbumin up to date. Will place on the waiting list for the [facility] eye clinic. Cannot afford to visit the podiatrist. She promises to check her CBGs at least 3 times a week (I  would prefer twice a day!).  Her updated medication list for this problem includes:    Lisinopril 20 Mg Tabs (Lisinopril) .Marland Kitchen... Take 1 tablet by mouth once a day    Novolog Mix 70/30 70-30 % Susp (Insulin asp prot & asp (hum)) .Marland Kitchen... 120 units subcutaneously every am    Novolog Mix 70/30 70-30 % Susp (Insulin asp prot & asp (hum)) .Marland KitchenMarland KitchenMarland KitchenMarland Kitchen 65 units subcutaneously every pm  Orders: Ophthalmology Referral (Ophthalmology)   Problem # 6:  HYPERLIPIDEMIA NEC/NOS (ICD-272.4) LDL not at goal for a diabetic. Will repeat her FLP next visit and adjust statin accordingly.  Her updated medication list for this problem includes:    Pravachol 20 Mg Tabs (Pravastatin sodium) .Marland Kitchen... Take 1 tab by mouth at  bedtime  Labs Reviewed: Chol: 165 (09/20/2007)   HDL: 45 (09/20/2007)   LDL: 110 (09/20/2007)   TG: 52 (09/20/2007) SGOT: 19 (03/01/2008)   SGPT: 24 (03/01/2008)   Problem # 7:  HYPERTENSION, BENIGN ESSENTIAL (ICD-401.1) BP excellent, at goal. Conttinue current regimen.  Her updated medication list for this problem includes:    Atenolol 100 Mg Tabs (Atenolol) .Marland Kitchen... Take 1 tablet by mouth once a day    Lisinopril 20 Mg Tabs (Lisinopril) .Marland Kitchen... Take 1 tablet by mouth once a day    Hydrochlorothiazide 25 Mg Tabs (Hydrochlorothiazide) .Marland Kitchen... Take 1 tablet by mouth once a day  BP today: 130/80 Prior BP: 110/70 (03/30/2008)  Labs Reviewed: Creat: 0.73 (03/01/2008) Chol: 165 (09/20/2007)   HDL: 45 (09/20/2007)   LDL: 110 (09/20/2007)   TG: 52 (09/20/2007)   Complete Medication List: 1)  Atenolol 100 Mg Tabs (Atenolol) .... Take 1 tablet by mouth once a day 2)  Lisinopril 20 Mg Tabs (Lisinopril) .... Take 1 tablet by mouth once a day 3)  Hydrochlorothiazide 25 Mg Tabs (Hydrochlorothiazide) .... Take 1 tablet by mouth once a day 4)  Lyrica 100 Mg Caps (Pregabalin) .... Take 1 tablet by mouth three times a day 5)  Advair Diskus 250-50 Mcg/dose Misc (Fluticasone-salmeterol) .... One puff two times a  day 6)  Albuterol 90 Mcg/act Aers (Albuterol) .... One puff every 6 hrs as needed for wheezing 7)  Novolog Mix 70/30 70-30 % Susp (Insulin asp prot & asp (hum)) .Marland Kitchen.. 120 units subcutaneously every am 8)  Novolog Mix 70/30 70-30 % Susp (Insulin asp prot & asp (hum)) .... 65 units subcutaneously every pm 9)  Darvocet-n 100 100-650 Mg Tabs (Propoxyphene n-apap) .... Take 1 -2 pills every 4 hours as needed for back pain. 10)  Tylenol Extra Strength 500 Mg Tabs (Acetaminophen) .... Take 1 tablet by mouth every 6 hours as needed for pain 11)  Pravachol 20 Mg Tabs (Pravastatin sodium) .... Take 1 tab by mouth at bedtime 12)  Plavix 75 Mg Tabs (Clopidogrel bisulfate) .... Take 1 tablet by mouth once a day 13)  Lexapro 10 Mg Tabs (Escitalopram oxalate) .... Take 1 tablet by mouth once a day   Patient Instructions: 1)  Please schedule a follow-up appointment in 1 month. 2)  Increase your insulin to 120 units in the morning and 65 units in the afternoon. 3)  Take the lexapro 10 mg 1 pill a day. 4)  We will send you to physical therapy for your back. 5)  The social worker will contact you next week regarding your questions about disability.    Prescriptions: LEXAPRO 10 MG  TABS (ESCITALOPRAM OXALATE) Take 1 tablet by mouth once a day  #30 x 11   Entered and Authorized by:   Peggye Pitt MD   Signed by:   Peggye Pitt MD on 04/06/2008   Method used:   Print then Give to Patient   RxID:   [id number] NOVOLOG MIX 70/30 70-30 % SUSP (INSULIN ASP PROT & ASP (HUM)) 65 units subcutaneously every pm  #60monthworth x 6   Entered and Authorized by:   Peggye Pitt MD   Signed by:   Peggye Pitt MD on 04/06/2008   Method used:   Print then Give to Patient   RxID:   [id number] NOVOLOG MIX 70/30 70-30 % SUSP (INSULIN ASP PROT & ASP (HUM)) 120 units Subcutaneously every am  #80monthworth x 6   Entered and Authorized by:   Peggye Pitt MD  Signed by:   Peggye Pitt MD on  04/06/2008   Method used:   Print then Give to Patient   RxID:   [id number]  ]  Vital Signs:  Patient Profile:   50 Years Old Female Height:     66 inches (167.64 cm) Weight:      249.5 pounds (113.41 kg) BMI:     40.42 Temp:     98.1 degrees F (36.72 degrees C) oral Pulse rate:   80 / minute BP sitting:   130 / 80             Last PAP Result Normal PapHx  Normal (08/17/2001 6:26:06 PM)

## 2011-01-21 NOTE — Assessment & Plan Note (Signed)
Summary: DM TEACHING/DS   Allergies: 1)  ! Morphine Sulfate (Morphine Sulfate) 2)  ! * Iodinated Contrast   Complete Medication List: 1)  Atenolol 100 Mg Tabs (Atenolol) .... Take 1 tablet by mouth once a day 2)  Lisinopril 20 Mg Tabs (Lisinopril) .... Take 1 tablet by mouth once a day 3)  Hydrochlorothiazide 25 Mg Tabs (Hydrochlorothiazide) .... Take 1 tablet by mouth once a day 4)  Advair Diskus 250-50 Mcg/dose Misc (Fluticasone-salmeterol) .... One puff two times a day 5)  Tylenol Extra Strength 500 Mg Tabs (Acetaminophen) .... Take 1 tablet by mouth every 6 hours as needed for pain 6)  Plavix 75 Mg Tabs (Clopidogrel bisulfate) .... Take 1 tablet by mouth once a day 7)  Ra Pen Needles 31g X 8 Mm Misc (Insulin pen needle) .... Use 3 times per day to check the sugar level 8)  Lyrica 50 Mg Caps (Pregabalin) .... Take 1 capsule by mouth three times a day 9)  Amitriptyline Hcl 75 Mg Tabs (Amitriptyline hcl) .... Take 1 tablet by mouth two times a day 10)  Lyrica 100 Mg Caps (Pregabalin) .... Take 1 tablet by mouth three times a day 11)  Tramadol Hcl 50 Mg Tabs (Tramadol hcl) .... One tab up to four times a day for pain 12)  Doxycycline Hyclate 100 Mg Caps (Doxycycline hyclate) .... Take 1 tablet by mouth two times a day 13)  Humalog Mix 75/25 Pen 75-25 % Susp (Insulin lispro prot & lispro) .... Inject under the skin 110 units in the am and 55 units in the evening every day 14)  Diflucan 150 Mg Tabs (Fluconazole) .... Take 1 tablet today and then stop 15)  Ambien 10 Mg Tabs (Zolpidem tartrate) .... Take 1 tablet once at bedtime as needed for insomnia 16)  Truetrack Test Strp (Glucose blood) .... Use to check blood sugar twice daily 17)  Lancets Misc (Lancets) .... Use to check blood sugar twice daily 18)  Nitrostat 0.4 Mg Subl (Nitroglycerin) .... Take one tablet under your tongue with severe chest pain, repeat in 5 minutes. attend to doctor/hospital if had to take more then 3 in 20  minutes  Other Orders: DSMT(Medicare) Individual, 30 Minutes (Z6109)  Diabetes Self Management Training  PCP: Mliss Sax MD Referring MD: B.Crenshaw Date diagnosed with diabetes: 09/21/1992 Diabetes Type: Type 2 insulin Other persons present: no Current smoking Status: current  Diabetes Medications:  Comments: a1c improved- brought meter, not happy with weight gain. Discsussed briefly with patient today who is startign on metformin. Advised her to always carry food or glucose tablets and to not self adjust insulin dose upwards as metformin will be helpin gher body use insulin better.  Mixed/Intermediate   Insulin Type:Humalog Mix 75/25 Breakfast Dose: 110  Dinner Dose:55    Monitoring Self monitoring blood glucose 2 times a day Name of Meter  True Track Measures urine ketones? No  Recent Episodes of: Requiring Help from another person  Hyperglycemia : Yes Hypoglycemia: No Severe Hypoglycemia : No     Diabetes Disease Process  Discussed today  Medications State name-action-dose-duration-side effects-and time to take medication: Needs review/assistance   State appropriate timing of food related to medication: Demonstrates competency    Nutritional Management  Monitoring  Complications State benefits-risks-and options for improving blood sugar control: Demonstrates competency   B/P and lipid control in the prevention/control of cardiovascular disease: Needs review/assistance    Exercise  Lifestyle changes:Goal setting and Problem solving Identify risk factors that interfere with  health: Needs review/assistance   Develop strategies to reduce risk factors: Needs review/assistance    Psychosocial Adjustment Name two ways of obtaining support from family/friends: Needs review/assistance   Diabetes Management Education Done: 02/07/2010        Tinnie Gens to sign up for weight managment class. Support and encouragement provided.  Diabetes Self Management  Support: niece, family- living wiht youngest sister now, ( little mom), clinic staff Follow-up:4-6 weeks

## 2011-01-21 NOTE — Letter (Signed)
Summary: Meter DownLoad  Meter DownLoad   Imported By: Florinda Marker 10/11/2009 14:48:39  _____________________________________________________________________  External Attachment:    Type:   Image     Comment:   External Document  Appended Document: Meter DownLoad     Impression & Recommendations:  Problem # 1:  DM W/MANIFESTATION NEC, TYPE II, UNCONTROLLED (ICD-250.82) Per meter review pt's glu is still above the target but it seems better controlled compared to last few months. Her average reading is 332 and her high's around 400 are usually in the evening time (dinner time). The good thing is that A1C is trending down for now and will continue to monitor this.   Her updated medication list for this problem includes:    Lisinopril 20 Mg Tabs (Lisinopril) .Marland Kitchen... Take 1 tablet by mouth once a day    Novolin 70/30 70-30 % Susp (Insulin isophane & regular) ..... Inject 110 units 30 minutes before morning meal and 75 units 30 minutes before evening meal  Labs Reviewed: Creat: 0.90 (09/14/2009)     Last Eye Exam: No diabetic retinopathy.    (05/31/2008) Reviewed HgBA1c results: 9.8 (09/14/2009)  9.9 (07/13/2009)

## 2011-01-21 NOTE — Progress Notes (Signed)
Summary: refill/ hla  Phone Note Refill Request Message from:  Patient on October 24, 2010 11:11 AM  Refills Requested: Medication #1:  TRAZODONE HCL 100 MG TABS take 1/2 tablet at bedtime as needed for insomnia   Dosage confirmed as above?Dosage Confirmed   Last Refilled: 9/15 Initial call taken by: Marin Roberts RN,  October 24, 2010 11:11 AM  Follow-up for Phone Call        completed refill, thank you Jennifer [name]  Follow-up by: Mliss Sax MD,  October 24, 2010 11:21 AM    Prescriptions: TRAZODONE HCL 100 MG TABS (TRAZODONE HCL) take 1/2 tablet at bedtime as needed for insomnia  #15 x 0   Entered and Authorized by:   Mliss Sax MD   Signed by:   Mliss Sax MD on 10/24/2010   Method used:   Electronically to        [facility] (retail)       [address]       [place], [place]  [address]       Ph: [phone]       Fax: [phone]   RxID:   [id number]

## 2011-01-21 NOTE — Progress Notes (Signed)
Summary: med refill/gp  Phone Note Refill Request Message from:  Patient on September 30, 2010 3:19 PM  Refills Requested: Medication #1:  ATENOLOL 100 MG TABS Take 1 tablet by mouth once a day  Medication #2:  HYDROCHLOROTHIAZIDE 25 MG TABS Take 1 tablet by mouth once a day  Medication #3:  PERCOCET 5-325 MG TABS take 1 tablet twice daily for pain   Last Refilled: 08/21/2010  Medication #4:  HUMALOG MIX 75/25 PEN 75-25 % SUSP Inject under the skin 110 units in the AM and 55 units in the evening every day     Also  #5  Pt.wants to be changed back to Lisinopril from Accupril  since she goes to [facility] not [facility].   Method Requested:   Initial call taken by: Chinita Pester RN,  September 30, 2010 3:19 PM  Follow-up for Phone Call        completed refill, thank you Jayse Hodkinson  Follow-up by: Mliss Sax MD,  October 01, 2010 9:58 AM    New/Updated Medications: LISINOPRIL 20 MG TABS (LISINOPRIL) Take 1 tablet by mouth once a day Prescriptions: PERCOCET 5-325 MG TABS (OXYCODONE-ACETAMINOPHEN) take 1 tablet twice daily for pain  #40 x 0   Entered and Authorized by:   Mliss Sax MD   Signed by:   Mliss Sax MD on 10/01/2010   Method used:   Print then Give to Patient   RxID:   [id number] PERCOCET 5-325 MG TABS (OXYCODONE-ACETAMINOPHEN) take 1 tablet twice daily for pain  #40 x 0   Entered and Authorized by:   Mliss Sax MD   Signed by:   Mliss Sax MD on 10/01/2010   Method used:   Print then Give to Patient   RxID:   [id number] HUMALOG MIX 75/25 PEN 75-25 % SUSP (INSULIN LISPRO PROT & LISPRO) Inject under the skin 110 units in the AM and 55 units in the evening every day  #1 x 5   Entered and Authorized by:   Mliss Sax MD   Signed by:   Mliss Sax MD on 10/01/2010   Method used:   Electronically to        [facility] (retail)       [address]       [place], [place]  [address]       Ph: [phone]       Fax:  [phone]   RxID:   [id number] RA PEN NEEDLES 31G X 8 MM MISC (INSULIN PEN NEEDLE) use 3 times per day to check the sugar level  #90 x 3   Entered and Authorized by:   Mliss Sax MD   Signed by:   Mliss Sax MD on 10/01/2010   Method used:   Electronically to        [facility] (retail)       [address]       [place], [place]  [address]       Ph: [phone]       Fax: [phone]   RxID:   [id number] PLAVIX 75 MG  TABS (CLOPIDOGREL BISULFATE) Take 1 tablet by mouth once a day  #90 x 3   Entered and Authorized by:   Mliss Sax MD   Signed by:   Mliss Sax MD on 10/01/2010   Method used:   Electron[facility] to  [facility] ([facility]       [address]       [place], [place]  [address]       Ph: [phone]       Fax: [phone]   RxID:   [id number] ADVAIR DISKUS 250-50 MCG/DOSE MISC (FLUTICASONE-SALMETEROL) One puff two times a day  #30 days x 11   Entered and Authorized by:   Mliss Sax MD   Signed by:   Mliss Sax MD on 10/01/2010   Method used:   Electronically to        [facility] (retail)       [address]       [place], [place]  [address]       Ph: [phone]       Fax: [phone]   RxID:   [id number] HYDROCHLOROTHIAZIDE 25 MG TABS (HYDROCHLOROTHIAZIDE) Take 1 tablet by mouth once a day  #30 x 6   Entered and Authorized by:   Mliss Sax MD   Signed by:   Mliss Sax MD on 10/01/2010   Method used:   Electronically to        [facility] (retail)       [address]       [place], [place]  [address]       Ph: [phone]       Fax: [phone]   RxID:   [id number] ATENOLOL 100 MG TABS (ATENOLOL) Take 1 tablet by mouth once a day  #30 x 6   Entered and Authorized by:   Mliss Sax MD   Signed by:   Mliss Sax MD on 10/01/2010   Method used:   Electronically to        [facility] (retail)       [address]       [place], [place]  [address]       Ph: [phone]       Fax: [phone]   RxID:   [id number] LISINOPRIL 20 MG TABS (LISINOPRIL) Take 1 tablet by mouth once a day  #31 x 5   Entered and Authorized by:   Mliss Sax MD   Signed by:   Mliss Sax MD on 10/01/2010   Method used:   Electronically to        [facility] (retail)       [address]       [place], [place]  [address]       Ph: [phone]       Fax: [phone]   RxID:   [id number]   Appended Document: med refill/gp Percocet Rx ready to be pick up;pt. was called.

## 2011-01-21 NOTE — Letter (Signed)
Summary: [facility] referral form  [facility] Pt referral form   Imported By: Marily Memos 07/09/2010 12:26:35  _____________________________________________________________________  External Attachment:    Type:   Image     Comment:   External Document

## 2011-01-21 NOTE — Assessment & Plan Note (Signed)
Summary: ACUTE-LEFT SIDE OF FACE SWOLLEN/HARD TO SWALLOW/(Jennifer [name])/CFB   Vital Signs:  Patient Profile:   50 Years Old Female Height:     66 inches (167.64 cm) Weight:      224.9 pounds Temp:     100.6 degrees F oral Pulse rate:   76 / minute BP sitting:   131 / 75  (right arm)  Pt. in pain?   yes    Location:   head    Intensity:   9     Type:       sharp  Vitals Entered ByFilomena Jungling (May 12, 2007 2:08 PM)              Is Patient Diabetic? Yes  CBG Result 104  Does patient need assistance? Functional Status Self care Ambulation Normal   PCP:  Peggye Pitt MD   History of Present Illness: [name] is a 50 y/o AAW with a h/o uncontrolled DM,HTN etc presenting to the clinic today with complaints of left side face swelling and pain, nasal discharge, sore throat and left ear pain for the last 2 weeks. She says her "sinuses are acting up." Symptoms are associated with mild fevers as well. No h/o tinnitus, ear discharge, hearing loss, cough, CP etc. She has not tried any OTC medications secondary to financial constraints.She apparently has been unable to go for work for the same.   Current Allergies (reviewed today): ! MORPHINE SULFATE (MORPHINE SULFATE) ! * IODINATED CONTRAST    Risk Factors:  Tobacco use:  current    Year started:  years Seatbelt use:  100 %  Mammogram History:    Date of Last Mammogram:  07/26/2004  PAP Smear History:    Date of Last PAP Smear:  08/17/2001   Review of Systems      See HPI   Physical Exam  General:     pale and mild distress.   Head:     atraumatic.Face slightly swollen appearing on the left above the mastoid sinus.   Eyes:     pupils equal, pupils round, and pupils reactive to light.   Ears:     R ear normal and L canal inflamed.   Nose:     no external erythema, mucosal erythema, mucosal edema, and L maxillary sinus tenderness.   Mouth:     pharynx pink and moist, no erythema, and no exudates.    Neck:     supple.   Lungs:     normal respiratory effort, normal breath sounds, no crackles, and no wheezes.   Heart:     normal rate, regular rhythm, no murmur, no gallop, and no rub.      Impression & Recommendations:  Problem # 1:  SINUSITIS, ACUTE NOS (ICD-461.9) Based on  patients's history of 2 week duration of URI symptoms and exam, she most likely has an acute sinusitis- viral vs bacterial. The duration of more than 10 days points more towards a bacterial etiology. I will treat her with Amoxicillin for 7 days. Typically 10 day treatment is required but since we have samples only for 7 days, will treat for 7 but if her symptoms are not relieved, we may extend treatment for another 3 days.Symptomatic treatment with saline nasal spray and tylenol for now. Pt says she cannot afford any decongestants hence will not recommend any for now. Will follow up in one week.Also a CBC has been ordered to check for increased WBC. Pt was advised to keep a watch  on her blood sugars.  Her updated medication list for this problem includes:    Amoxicillin 500 Mg Caps (Amoxicillin) .Marland Kitchen... Take 1 tablet by mouth three times a day    Nasal Spray Saline 0.65 % Soln (Saline) .Marland Kitchen..Marland Kitchen Two sprays in  each nostril three times a day.  Orders: T-CBC w/Diff ([id number])   Problem # 2:  HYPERTENSION, BENIGN ESSENTIAL (ICD-401.1) Under good control. No changes in medications today. Her updated medication list for this problem includes:    Atenolol 100 Mg Tabs (Atenolol) .Marland Kitchen... Take 1 tablet by mouth once a day    Lisinopril 20 Mg Tabs (Lisinopril) .Marland Kitchen... Take 1 tablet by mouth once a day    Hydrochlorothiazide 25 Mg Tabs (Hydrochlorothiazide) .Marland Kitchen... Take 1 tablet by mouth once a day   Medications Added to Medication List This Visit: 1)  Amoxicillin 500 Mg Caps (Amoxicillin) .... Take 1 tablet by mouth three times a day 2)  Nasal Spray Saline 0.65 % Soln (Saline) .... Two sprays in  each nostril three times a  day. 3)  Tylenol Extra Strength 500 Mg Tabs (Acetaminophen) .... Take 1 tablet by mouth every 6 hours as needed for pain   Patient Instructions: 1)  Please schedule a follow-up appointment in one week to check for resolution of symptoms or earlier if no relief with antibiotics. 2)  Take medications as prescribed. 3)  Antibiotics to be stopped after a full 7 day course. 4)  Keep an eye on your blood sugars especially since you currently have an infection. 5)  Use over the counter Lozenges for sore throat or Cepachol spray for the same(use twice a day or as directed on the box)     Vital Signs:  Patient Profile:   50 Years Old Female Height:     66 inches (167.64 cm) Weight:      224.9 pounds Temp:     100.6 degrees F oral Pulse rate:   76 / minute BP sitting:   131 / 75    Location:   head    Intensity:   9     Type:       sharp             CBG Result 104 Last PAP Result Normal PapHx  Normal (08/17/2001 6:26:06 PM)

## 2011-01-21 NOTE — Letter (Signed)
Summary: [facility]  Ctr: Dr. Dewitt Rota  Ctr: Dr. Tanda Rockers   Imported By: Florinda Marker 04/24/2008 15:13:21  _____________________________________________________________________  External Attachment:    Type:   Image     Comment:   External Document

## 2011-01-21 NOTE — Miscellaneous (Signed)
[facility]   Imported By: Florinda Marker 02/17/2007 15:06:06  _____________________________________________________________________  External Attachment:    Type:   Image     Comment:   External Document

## 2011-01-21 NOTE — Assessment & Plan Note (Signed)
Summary: L breast, red lesion,warm,painful/pcp-hernandez/hla   Vital Signs:  Patient Profile:   50 Years Old Female Height:     66 inches (167.64 cm) Weight:      252.6 pounds (114.82 kg) BMI:     40.92 Temp:     96.8 degrees F (36 degrees C) oral Pulse rate:   71 / minute BP sitting:   137 / 80  (right arm)  Pt. in pain?   yes    Location:   all over and left breast    Intensity:   8  Vitals Entered By: Stanton Kidney Ditzler RN (June 22, 2008 9:08 AM)              Is Patient Diabetic? Yes  Nutritional Status BMI of > 30 = obese Nutritional Status Detail ok CBG Result 332  Have you ever been in a relationship where you felt threatened, hurt or afraid?denies   Does patient need assistance? Functional Status Self care Ambulation Impaired:Risk for fall Comments Uses a cane.     PCP:  Peggye Pitt MD  Chief Complaint:  " boil " left breast..  History of Present Illness: 50 yo female with PMH of recurrent perirectal and vaginal abscesses, hypertension, hyperlipidemia, and DM2 came in complaining of boil on her left breast that has first appeared about 3 weeks ago. She tried to pop it but nothing came out. The boil started to get bigger and she kept trying to squeeze it out. She popped it yesterday and lots of pus came out, thick and yellow as well as some blood. Patient put some alcohol on gauze and placed it on boil which made it worse and it changed in color from red to black. Now it is painfu, 8/10 in severity, warm to touch and it is itching. She did not scratch it. Right now there is no discharge. She denies fever, chills, no N/V or diarrhea and reports no traumas to the region. This is the first time that patient had boil on her breast but she has had boils in other places, on rectum and vagina. She denies using any new lotions, deodorant and maintains good hygiene.    Acute Visit History:      She denies chest pain, cough, fever, and headache.    Current Allergies  (reviewed today): ! MORPHINE SULFATE (MORPHINE SULFATE) ! * IODINATED CONTRAST   Family History:    Mother  DM2, CHF, living    Father asthma, living    Brothers and sisters asthma since childhood  Social History:    Lives with mom at home, currently unemployed   Risk Factors:  Tobacco use:  current    Year started:  years    Cigarettes:  Yes -- /  5 pack(s) per day Alcohol use:  no Exercise:  no Seatbelt use:  100 %  Mammogram History:    Date of Last Mammogram:  04/11/2008  PAP Smear History:    Date of Last PAP Smear:  08/17/2001   Review of Systems       The patient complains of depression.  The patient denies fever, weight loss, weight gain, chest pain, dyspnea on exertion, headaches, suspicious skin lesions, and breast masses.         Patient has history of depression for 5-6 years and is taking Lexapro.   Physical Exam  General:     alert and normal appearance.   Head:     normocephalic and atraumatic.   Ears:  R ear normal and L ear normal.   Mouth:     teeth missing, small hemangioma on left side, pharynx pink and moist, no exudates, teeth missing.   Neck:     supple and full ROM.   Breasts:     no nipple discharge, no tenderness, and no adenopathy.   Lungs:     normal respiratory effort, no accessory muscle use, normal breath sounds, no crackles, and no wheezes.   Heart:     normal rate, regular rhythm, no murmur, no gallop, and no rub.   Abdomen:     soft, non-tender, normal bowel sounds, and no distention.   Msk:     normal ROM.   Pulses:     R radial normal and L femoral normal.   Neurologic:     alert & oriented X3, cranial nerves II-XII intact, strength normal in all extremities, and sensation intact to light touch.   Skin:     turgor normal, color normal, and no rashes, 1/2 inch lesion on left breast with area of induration around it and black in the middle, no blood Cervical Nodes:     no anterior cervical adenopathy and no  posterior cervical adenopathy.   Axillary Nodes:     no R axillary adenopathy and no L axillary adenopathy.   Psych:     Oriented X3.       Impression & Recommendations:  Problem # 1:  ABSCESS, SKIN (ICD-682.9) Patient came in with 1/2 inch boil on her left breast. She does not have fever, chills, or other systemic symptoms but  boil has been there for almost 3 weeks and it is getting more painful and bigger in size. Boil did not look like it could be drained so we decided to put her on Doxycycline 100mg  twice daily for 14 days. We will see her in clinic next week to see if it improved. Her updated medication list for this problem includes:    Adoxa 100 Mg Tabs (Doxycycline monohydrate) .Marland Kitchen... Take 1 tablet by mouth two times a day   Problem # 2:  OTHER&UNSPECIFIED DISEASES THE ORAL SOFT TISSUES (ICD-528.9) On examination I noticed small lesion in patient's mouth on the left side. She had that for some time but it has been slowly growing in size. It is possible that is small hemangioma but given her long history of smoking I am concerned about possibility of cancer. We referred patient to oral surgery for biopsy of the lesion. Orders: Surgical Referral (Surgery)   Problem # 3:  DM W/MANIFESTATION NEC, TYPE II, UNCONTROLLED (ICD-250.82) Poor control of diabetes, this morning CBG was 332. Patient denied blurry vision, fatigue, nervousness, dry mouth so we decided to keep her on her current diabetes regimen and we also encouraged patient to keep log of her glucose checks. We will see her back in one week and recheck her glucose. Her last HgBA1C was done 06/05 and was 9.2. Her updated medication list for this problem includes:    Lisinopril 20 Mg Tabs (Lisinopril) .Marland Kitchen... Take 1 tablet by mouth once a day    Novolog Mix 70/30 70-30 % Susp (Insulin asp prot & asp (hum)) .Marland Kitchen... 120 units subcutaneously every am    Novolog Mix 70/30 70-30 % Susp (Insulin asp prot & asp (hum)) .Marland KitchenMarland KitchenMarland KitchenMarland Kitchen 65 units  subcutaneously every pm   Problem # 4:  HYPERTENSION, BENIGN ESSENTIAL (ICD-401.1) Today BP = 137/80, good control with current medications. Her updated medication list for this problem includes:  Atenolol 100 Mg Tabs (Atenolol) .Marland Kitchen... Take 1 tablet by mouth once a day    Lisinopril 20 Mg Tabs (Lisinopril) .Marland Kitchen... Take 1 tablet by mouth once a day    Hydrochlorothiazide 25 Mg Tabs (Hydrochlorothiazide) .Marland Kitchen... Take 1 tablet by mouth once a day   Problem # 5:  HYPERLIPIDEMIA NEC/NOS (ICD-272.4) Patient continues to take Pravastatin regularly, we will check her lipid profile and will follow up with her next week when she is scheduled for an appointment. Her updated medication list for this problem includes:    Pravachol 20 Mg Tabs (Pravastatin sodium) .Marland Kitchen... Take 1 tab by mouth at bedtime  Orders: T-Lipid Profile ([id number])   Complete Medication List: 1)  Atenolol 100 Mg Tabs (Atenolol) .... Take 1 tablet by mouth once a day 2)  Lisinopril 20 Mg Tabs (Lisinopril) .... Take 1 tablet by mouth once a day 3)  Hydrochlorothiazide 25 Mg Tabs (Hydrochlorothiazide) .... Take 1 tablet by mouth once a day 4)  Lyrica 100 Mg Caps (Pregabalin) .... Take 1 tablet by mouth three times a day 5)  Advair Diskus 250-50 Mcg/dose Misc (Fluticasone-salmeterol) .... One puff two times a day 6)  Novolog Mix 70/30 70-30 % Susp (Insulin asp prot & asp (hum)) .Marland Kitchen.. 120 units subcutaneously every am 7)  Novolog Mix 70/30 70-30 % Susp (Insulin asp prot & asp (hum)) .... 65 units subcutaneously every pm 8)  Tylenol Extra Strength 500 Mg Tabs (Acetaminophen) .... Take 1 tablet by mouth every 6 hours as needed for pain 9)  Pravachol 20 Mg Tabs (Pravastatin sodium) .... Take 1 tab by mouth at bedtime 10)  Plavix 75 Mg Tabs (Clopidogrel bisulfate) .... Take 1 tablet by mouth once a day 11)  Lexapro 10 Mg Tabs (Escitalopram oxalate) .... Take 1 tablet by mouth once a day 12)  Adoxa 100 Mg Tabs (Doxycycline monohydrate)  .... Take 1 tablet by mouth two times a day  Other Orders: Capillary Blood Glucose ([id number]) Fingerstick (64332)   Patient Instructions: 1)  Continue current medications and the new one Doxycycline as instructed. 2)  I made referral for oral surgery for biopsy of the left cheek. 3)  Please schedule a follow-up appointment in 1 .   Prescriptions: ADOXA 100 MG  TABS (DOXYCYCLINE MONOHYDRATE) Take 1 tablet by mouth two times a day  #28 x 0   Entered and Authorized by:   Mliss Sax MD   Signed by:   Mliss Sax MD on 06/22/2008   Method used:   Print then Give to Patient   RxID:   [id number] ADOXA 100 MG  TABS (DOXYCYCLINE MONOHYDRATE) Take 1 tablet by mouth two times a day  #28 x 0   Entered and Authorized by:   Mliss Sax MD   Signed by:   Mliss Sax MD on 06/22/2008   Method used:   Print then Give to Patient   RxID:   [id number]  ]

## 2011-01-21 NOTE — Progress Notes (Signed)
  Phone Note Call from Patient   Summary of Call: Pt called and c/o back and abd pain x 1 month.  Took advil and some other pain med and still having pain.  denies vomiting but c/o loose stools. Offered appointment tomorrow per Dr Okey Dupre but pt refused, wants to go to ED Initial call taken by: Merrie Roof RN,  September 22, 2007 3:03 PM

## 2011-01-21 NOTE — Letter (Signed)
Summary: Handout Printed  Printed Handout:  - *Patient Instructions 

## 2011-01-21 NOTE — Assessment & Plan Note (Signed)
Summary: ACUTE-BOIL ON RECTUM/(MAGICK)/CFB   Vital Signs:  Patient profile:   50 year old female Height:      66 inches (167.64 cm) Weight:      248.3 pounds (112.86 kg) BMI:     40.22 Temp:     97.3 degrees F (36.28 degrees C) oral Pulse rate:   80 / minute BP sitting:   128 / 82  (right arm)  Vitals Entered By: Stanton Kidney Ditzler RN (August 13, 2009 11:11 AM) Is Patient Diabetic? Yes  Pain Assessment Patient in pain? yes     Location: rectum Intensity: 10 Onset of pain  past 2 months Nutritional Status BMI of > 30 = obese Nutritional Status Detail appetite good CBG Result 208  Have you ever been in a relationship where you felt threatened, hurt or afraid?denies   Does patient need assistance? Functional Status Self care Ambulation Impaired:Risk for fall Comments Uses a cane. Ck rectum - hurting past 2 months. Ck right knee - losing balance.   Primary Care Provider:  Mliss Sax MD   History of Present Illness: Jennifer [name] is a 50 year old Female with PMH/problems as outlined in the EMR, who presents to the [facility] with chief complaint(s) of:   1. Swelling in the bottom: She thinks it's a boil on the left cheek of her butt. Has been there for about two months. She popped and it is still there.   2. R knee: right knee pain for a while. She is losing balance now. Walking downstairs is difficult and her knee gives away. This is something new. Has similar problem in left knee as well, but not as worse. Has not seen any doctor for the knee problem. Pt says, she had RA as a child. Has pain in the back too but not in hands and feet. No fever, CP, SOB, weight loss, lymph node swellings, NVD or any systemic complaints.   3. DM: Lowest is 136 and highest was off the meter. Did not bring the meter with her today.   Depression History:      The patient denies a depressed mood most of the day and a diminished interest in her usual daily activities.         Preventive  Screening-Counseling & Management  Alcohol-Tobacco     Alcohol drinks/day: 0     Smoking Status: current     Smoking Cessation Counseling: yes     Packs/Day: 1.0     Year Started: years     Passive Smoke Exposure: yes  Caffeine-Diet-Exercise     Does Patient Exercise: no  Medications Prior to Update: 1)  Atenolol 100 Mg Tabs (Atenolol) .... Take 1 Tablet By Mouth Once A Day 2)  Lisinopril 20 Mg Tabs (Lisinopril) .... Take 1 Tablet By Mouth Once A Day 3)  Hydrochlorothiazide 25 Mg Tabs (Hydrochlorothiazide) .... Take 1 Tablet By Mouth Once A Day 4)  Advair Diskus 250-50 Mcg/dose Misc (Fluticasone-Salmeterol) .... One Puff Two Times A Day 5)  Novolog Mix 70/30 70-30 % Susp (Insulin Asp Prot & Asp (Hum)) .Marland Kitchen.. 120 Units Subcutaneously Every Am 6)  Novolog Mix 70/30 70-30 % Susp (Insulin Asp Prot & Asp (Hum)) .... 65 Units Subcutaneously Every Pm 7)  Tylenol Extra Strength 500 Mg Tabs (Acetaminophen) .... Take 1 Tablet By Mouth Every 6 Hours As Needed For Pain 8)  Pravachol 20 Mg  Tabs (Pravastatin Sodium) .... Take 1 Tab By Mouth At Bedtime 9)  Plavix 75 Mg  Tabs (Clopidogrel  Bisulfate) .... Take 1 Tablet By Mouth Once A Day 10)  Ra Pen Needles 31g X 8 Mm Misc (Insulin Pen Needle) .... Use 3 Times Per Day To Check The Sugar Level 11)  Lyrica 50 Mg Caps (Pregabalin) .... Take 1 Capsule By Mouth Three Times A Day 12)  Cipro 500 Mg Tabs (Ciprofloxacin Hcl) .... Take 1 Tablet By Mouth Two Times A Day 13)  Amitriptyline Hcl 75 Mg Tabs (Amitriptyline Hcl) .... Take 1 Tablet By Mouth Two Times A Day 14)  Lyrica 100 Mg Caps (Pregabalin) .... Take 1 Tablet By Mouth Three Times A Day  Allergies: 1)  ! Morphine Sulfate (Morphine Sulfate) 2)  ! * Iodinated Contrast  Past History:  Past Medical History: Last updated: 11/24/2008 Current Problems:  RENAL FAILURE, ACUTE, HX OF (ICD-V13.09) TOBACCO ABUSE (ICD-305.1)     COPD (ICD-496) HYPERLIPIDEMIA NEC/NOS (ICD-272.4)     TRANSAMINASES, SERUM,  ELEVATED (ICD-790.4) Diabetes mellitus type II     DIABETIC PERIPHERAL NEUROPATHY (ICD-250.60)     DIABETIC FOOT ULCER, TOE (ICD-250.80) HYPERTENSION, BENIGN ESSENTIAL (ICD-401.1)  Past Surgical History: Last updated: 01/08/2007 PTCA/stent, left iliac 2005 R shoulder arthroscropy/debridement Hysterectomy, 1997 due to fibroids  Family History: Last updated: 06/22/2008 Mother  DM2, CHF, living Father asthma, living Brothers and sisters asthma since childhood  Social History: Last updated: 03/28/2009 Lives with daughter-in-law in apartment, currently unemployed  Risk Factors: Alcohol Use: 0 (08/13/2009) Exercise: no (08/13/2009)  Risk Factors: Smoking Status: current (08/13/2009) Packs/Day: 1.0 (08/13/2009) Passive Smoke Exposure: yes (08/13/2009)  Social History: Smoking Status:  current Packs/Day:  1.0  Review of Systems       Patient denies fever, weight loss, fatigue, weakness/numbness, headache, NVD, abdominal pain, hematemesis/melena, chest pain, SOB, palpitations, cough,  rashes.    Physical Exam  General:  alert and well-developed.   Eyes:  vision grossly intact, pupils equal, pupils round, and pupils reactive to light.   Mouth:  pharynx pink and moist, no erythema, and no exudates.   Neck:  supple.   Lungs:  normal respiratory effort and normal breath sounds.   Heart:  normal rate and regular rhythm.   Abdomen:  soft and non-tender.   Rectal:  1 cm boil on left buttock near anal verge. MIldly tender, no fluctuation, overlying skin denuded.  Msk:  R knee: tender over joint line. ROM: normal. Mild crepts. No obvious ligamental/ structural abnormality.  L knee: normal No signs of active infection / inflammation on other joints / spine.  Extremities:  no cyanosis, clubbing or edema  Neurologic:  non focal.  Psych:  normally interactive.     Impression & Recommendations:  Problem # 1:  KNEE PAIN (ICD-719.46) Likely degenerative changes / OA. Will check  serological markers to rule out RA / connective tissue disorders, although possibility is low. Will get knee x-ray and place her on tramadol.   Her updated medication list for this problem includes:    Tylenol Extra Strength 500 Mg Tabs (Acetaminophen) .Marland Kitchen... Take 1 tablet by mouth every 6 hours as needed for pain    Tramadol Hcl 50 Mg Tabs (Tramadol hcl) ..... One tab up to four times a day for pain  Orders: T-ANA ([id number]) T-CBC w/Diff ([id number]) Radiology other (Radiology Other) T-Sed Rate (Automated) [phone]) T-Rheumatoid Factor [phone])  Problem # 2:  BOILS, RECURRENT (ICD-680.9) No I&D needed for the boil on the buttock. Will treat with a course of abx.   Problem # 3:  DM W/MANIFESTATION NEC, TYPE II, UNCONTROLLED (ICD-250.82)  Didn't bring her glucometer. No changes today. Will review with her records next time   Her updated medication list for this problem includes:    Lisinopril 20 Mg Tabs (Lisinopril) .Marland Kitchen... Take 1 tablet by mouth once a day    Novolog Mix 70/30 70-30 % Susp (Insulin asp prot & asp (hum)) .Marland Kitchen... 120 units subcutaneously every am    Novolog Mix 70/30 70-30 % Susp (Insulin asp prot & asp (hum)) .Marland KitchenMarland KitchenMarland KitchenMarland Kitchen 65 units subcutaneously every pm  Problem # 4:  HYPERTENSION, BENIGN ESSENTIAL (ICD-401.1) Controlled on current regimen. Will check b-met.   Her updated medication list for this problem includes:    Atenolol 100 Mg Tabs (Atenolol) .Marland Kitchen... Take 1 tablet by mouth once a day    Lisinopril 20 Mg Tabs (Lisinopril) .Marland Kitchen... Take 1 tablet by mouth once a day    Hydrochlorothiazide 25 Mg Tabs (Hydrochlorothiazide) .Marland Kitchen... Take 1 tablet by mouth once a day  Orders: T-Basic Metabolic Panel [id number]  Complete Medication List: 1)  Atenolol 100 Mg Tabs (Atenolol) .... Take 1 tablet by mouth once a day 2)  Lisinopril 20 Mg Tabs (Lisinopril) .... Take 1 tablet by mouth once a day 3)  Hydrochlorothiazide 25 Mg Tabs (Hydrochlorothiazide) .... Take 1 tablet by  mouth once a day 4)  Advair Diskus 250-50 Mcg/dose Misc (Fluticasone-salmeterol) .... One puff two times a day 5)  Novolog Mix 70/30 70-30 % Susp (Insulin asp prot & asp (hum)) .Marland Kitchen.. 120 units subcutaneously every am 6)  Novolog Mix 70/30 70-30 % Susp (Insulin asp prot & asp (hum)) .... 65 units subcutaneously every pm 7)  Tylenol Extra Strength 500 Mg Tabs (Acetaminophen) .... Take 1 tablet by mouth every 6 hours as needed for pain 8)  Pravachol 20 Mg Tabs (Pravastatin sodium) .... Take 1 tab by mouth at bedtime 9)  Plavix 75 Mg Tabs (Clopidogrel bisulfate) .... Take 1 tablet by mouth once a day 10)  Ra Pen Needles 31g X 8 Mm Misc (Insulin pen needle) .... Use 3 times per day to check the sugar level 11)  Lyrica 50 Mg Caps (Pregabalin) .... Take 1 capsule by mouth three times a day 12)  Amitriptyline Hcl 75 Mg Tabs (Amitriptyline hcl) .... Take 1 tablet by mouth two times a day 13)  Lyrica 100 Mg Caps (Pregabalin) .... Take 1 tablet by mouth three times a day 14)  Tramadol Hcl 50 Mg Tabs (Tramadol hcl) .... One tab up to four times a day for pain 15)  Doxycycline Hyclate 100 Mg Caps (Doxycycline hyclate) .... Take 1 tablet by mouth two times a day  Other Orders: Capillary Blood Glucose/CBG ([id number]) T-Hepatic Function ([id number])  Patient Instructions: 1)  Please schedule a follow-up appointment in 1 month. 2)  Please bring your glucometer next time.  3)  We will let you know if anything wrong with your lab work.   Prescriptions: DOXYCYCLINE HYCLATE 100 MG CAPS (DOXYCYCLINE HYCLATE) Take 1 tablet by mouth two times a day  #20 x 0   Entered and Authorized by:   Zara Council MD   Signed by:   Zara Council MD on 08/13/2009   Method used:   Print then Give to Patient   RxID:   [id number] TRAMADOL HCL 50 MG TABS (TRAMADOL HCL) One tab up to four times a day for pain  #50 x 1   Entered and Authorized by:   Zara Council MD   Signed by:   Zara Council MD on 08/13/2009   Method used:  Print then Give to Patient   RxID:   [id number] AMITRIPTYLINE HCL 75 MG TABS (AMITRIPTYLINE HCL) Take 1 tablet by mouth two times a day  #60 x 3   Entered and Authorized by:   Zara Council MD   Signed by:   Zara Council MD on 08/13/2009   Method used:   Print then Give to Patient   RxID:   [id number] PRAVACHOL 20 MG  TABS (PRAVASTATIN SODIUM) Take 1 tab by mouth at bedtime  #30 x 6   Entered and Authorized by:   Zara Council MD   Signed by:   Zara Council MD on 08/13/2009   Method used:   Print then Give to Patient   RxID:   [id number] HYDROCHLOROTHIAZIDE 25 MG TABS (HYDROCHLOROTHIAZIDE) Take 1 tablet by mouth once a day  #30 x 6   Entered and Authorized by:   Zara Council MD   Signed by:   Zara Council MD on 08/13/2009   Method used:   Print then Give to Patient   RxID:   [id number] LISINOPRIL 20 MG TABS (LISINOPRIL) Take 1 tablet by mouth once a day  #30 x 6   Entered and Authorized by:   Zara Council MD   Signed by:   Zara Council MD on 08/13/2009   Method used:   Print then Give to Patient   RxID:   [id number] ATENOLOL 100 MG TABS (ATENOLOL) Take 1 tablet by mouth once a day  #30 x 6   Entered and Authorized by:   Zara Council MD   Signed by:   Zara Council MD on 08/13/2009   Method used:   Print then Give to Patient   RxID:   [id number]   Process Orders Check Orders Results:     [facility]: ABN not required for this insurance Tests Sent for requisitioning (August 13, 2009 12:36 PM):     08/13/2009: [facility] -- T-Basic Metabolic Panel [id number] (signed)     08/13/2009: [facility] -- Jackie Plum [[id number]] (signed)     08/13/2009: [facility] -- T-CBC w/Diff [[id number]] (signed)     08/13/2009: [facility] -- T-Hepatic Function [id number] (signed)     08/13/2009: [facility] -- T-Sed Rate (Automated) [id number] (signed)      08/13/2009: [facility] -- T-Rheumatoid Factor [id number] (signed)    Prevention & Chronic Care Immunizations   Influenza vaccine: Not documented    Tetanus booster: Not documented    Pneumococcal vaccine: Not documented  Colorectal Screening   Hemoccult: Not documented    Colonoscopy: Not documented  Other Screening   Pap smear: Normal  (08/17/2001)    Mammogram: No specific mammographic evidence of malignancy.  Assessment: BIRADS 1.   (04/11/2008)   Mammogram due: 04/2009   Smoking status: current  (08/13/2009)   Smoking cessation counseling: yes  (08/13/2009)  Diabetes Mellitus   HgbA1C: 9.9  (07/13/2009)    Eye exam: No diabetic retinopathy.     (05/31/2008)   Eye exam due: 05/2009    Foot exam: yes  (07/13/2009)   High risk foot: Not documented   Foot care education: Not documented    Urine microalbumin/creatinine ratio: 1.7  (09/29/2007)    Diabetes flowsheet reviewed?: Yes   Progress toward A1C goal: Unchanged  Lipids   Total Cholesterol: 171  (11/24/2008)   LDL: 79  (11/24/2008)   LDL Direct: Not documented   HDL: 30  (11/24/2008)   Triglycerides: 310  (11/24/2008)  SGOT (AST): 21  (01/29/2009)   SGPT (ALT): 45  (01/29/2009)   Alkaline phosphatase: 109  (01/29/2009)   Total bilirubin: 0.4  (01/29/2009)    Lipid flowsheet reviewed?: Yes   Progress toward LDL goal: Unchanged  Hypertension   Last Blood Pressure: 128 / 82  (08/13/2009)   Serum creatinine: 0.84  (01/29/2009)   Serum potassium 4.3  (01/29/2009)    Hypertension flowsheet reviewed?: Yes   Progress toward BP goal: Unchanged  Self-Management Support :    Diabetes self-management support: Not documented    Hypertension self-management support: Not documented    Lipid self-management support: Not documented

## 2011-01-21 NOTE — Progress Notes (Signed)
Summary: Patient assistance  Phone Note Refill Request        Prescriptions: PRAVACHOL 20 MG  TABS (PRAVASTATIN SODIUM) Take 1 tab by mouth at bedtime  #90 x 0   Entered and Authorized by:   Concepcion Elk   Signed by:   Peggye Pitt MD on 02/27/2008   Method used:   Samples Given   RxID:   [id number] NOVOLOG MIX 70/30 70-30 % SUSP (INSULIN ASP PROT & ASP (HUM)) 100 units Subcutaneously every am  #11 vials x 0   Entered and Authorized by:   Concepcion Elk   Signed by:   Peggye Pitt MD on 02/27/2008   Method used:   Samples Given   RxID:   [id number]   Patient Assist Medication Verification: Medication: Novolog 70-30 mix Lot# [id number] Exp Date:02-11 Tech approval:NLS                  Patient Assist Medication Verification: Medication:Plavix 75mg  BMW#[id number] Exp Date:09-11 Tech approval:NLS

## 2011-01-21 NOTE — Progress Notes (Signed)
Summary: med reill/gp  Phone Note Refill Request Message from:  Fax from Pharmacy on March 21, 2010 12:21 PM  Refills Requested: Medication #1:  LISINOPRIL 20 MG TABS Take 1 tablet by mouth once a day   Last Refilled: 02/20/2010  Medication #2:  ATENOLOL 100 MG TABS Take 1 tablet by mouth once a day   Last Refilled: 02/20/2010  Medication #3:  HYDROCHLOROTHIAZIDE 25 MG TABS Take 1 tablet by mouth once a day   Last Refilled: 02/20/2010  Method Requested: Telephone to Pharmacy Initial call taken by: Chinita Pester RN,  March 21, 2010 12:21 PM  Follow-up for Phone Call        completed  Follow-up by: Mliss Sax MD,  March 22, 2010 11:37 AM    Prescriptions: HYDROCHLOROTHIAZIDE 25 MG TABS (HYDROCHLOROTHIAZIDE) Take 1 tablet by mouth once a day  #30 x 6   Entered and Authorized by:   Mliss Sax MD   Signed by:   Mliss Sax MD on 03/22/2010   Method used:   Faxed to ...       [facility] Department (retail)       [address] [place], [place]  [address]       Ph: [phone]       Fax: [phone]   RxID:   [id number] LISINOPRIL 20 MG TABS (LISINOPRIL) Take 1 tablet by mouth once a day  #30 x 6   Entered and Authorized by:   Mliss Sax MD   Signed by:   Mliss Sax MD on 03/22/2010   Method used:   Faxed to ...       [facility] Department (retail)       [address] [place], [place]  [address]       Ph: [phone]       Fax: [phone]   RxID:   [id number] ATENOLOL 100 MG TABS (ATENOLOL) Take 1 tablet by mouth once a day  #30 x 6   Entered and Authorized by:   Mliss Sax MD   Signed by:   Mliss Sax MD on 03/22/2010   Method used:   Faxed to ...       [facility] Department (retail)       [address] [place], [place]  [address]       Ph: [phone]       Fax: [phone]   RxID:   [id number] HYDROCHLOROTHIAZIDE 25 MG TABS (HYDROCHLOROTHIAZIDE) Take 1 tablet by mouth once a day   #30 x 6   Entered and Authorized by:   Mliss Sax MD   Signed by:   Mliss Sax MD on 03/22/2010   Method used:   Print then Give to Patient   RxID:   [id number] LISINOPRIL 20 MG TABS (LISINOPRIL) Take 1 tablet by mouth once a day  #30 x 6   Entered and Authorized by:   Mliss Sax MD   Signed by:   Mliss Sax MD on 03/22/2010   Method used:   Print then Give to Patient   RxID:   [id number] ATENOLOL 100 MG TABS (ATENOLOL) Take 1 tablet by mouth once a day  #30 x 6   Entered and Authorized by:   Mliss Sax MD   Signed by:   Mliss Sax MD on 03/22/2010   Method used:   Print then Give to Patient  RxID:   [id number]   Appended Document: med reill/gp Above Rx refill requests faxed to [facility].

## 2011-01-21 NOTE — Assessment & Plan Note (Signed)
Summary: diabetes follow-up/dmr   Vital Signs:  Patient profile:   50 year old female Weight:      250.4 pounds BMI:     40.56 Is Patient Diabetic? Yes Did you bring your meter with you today? No   Allergies: 1)  ! Morphine Sulfate (Morphine Sulfate) 2)  ! * Iodinated Contrast   Complete Medication List: 1)  Atenolol 100 Mg Tabs (Atenolol) .... Take 1 tablet by mouth once a day 2)  Lisinopril 20 Mg Tabs (Lisinopril) .... Take 1 tablet by mouth once a day 3)  Hydrochlorothiazide 25 Mg Tabs (Hydrochlorothiazide) .... Take 1 tablet by mouth once a day 4)  Advair Diskus 250-50 Mcg/dose Misc (Fluticasone-salmeterol) .... One puff two times a day 5)  Tylenol Extra Strength 500 Mg Tabs (Acetaminophen) .... Take 1 tablet by mouth every 6 hours as needed for pain 6)  Plavix 75 Mg Tabs (Clopidogrel bisulfate) .... Take 1 tablet by mouth once a day 7)  Ra Pen Needles 31g X 8 Mm Misc (Insulin pen needle) .... Use 3 times per day to check the sugar level 8)  Lyrica 50 Mg Caps (Pregabalin) .... Take 1 capsule by mouth three times a day 9)  Amitriptyline Hcl 75 Mg Tabs (Amitriptyline hcl) .... Take 1 tablet by mouth two times a day 10)  Lyrica 100 Mg Caps (Pregabalin) .... Take 1 tablet by mouth three times a day 11)  Tramadol Hcl 50 Mg Tabs (Tramadol hcl) .... One tab up to four times a day for pain 12)  Doxycycline Hyclate 100 Mg Caps (Doxycycline hyclate) .... Take 1 tablet by mouth two times a day 13)  Humalog Mix 75/25 Pen 75-25 % Susp (Insulin lispro prot & lispro) .... Inject under the skin 110 units in the am and 55 units in the evening every day 14)  Diflucan 150 Mg Tabs (Fluconazole) .... Take 1 tablet today and then stop 15)  Ambien 10 Mg Tabs (Zolpidem tartrate) .... Take 1 tablet once at bedtime as needed for insomnia 16)  Truetrack Test Strp (Glucose blood) .... Use to check blood sugar twice daily 17)  Lancets Misc (Lancets) .... Use to check blood sugar twice daily 18)  Lexapro 10  Mg Tabs (Escitalopram oxalate) .Marland Kitchen.. 1 tablet by mouth every morning 19)  Nitrostat 0.4 Mg Subl (Nitroglycerin) .... Take one tablet under your tongue with severe chest pain, repeat in 5 minutes. attend to doctor/hospital if had to take more then 3 in 20 minutes  Other Orders: DSMT(Medicare) Individual, 30 Minutes (Q4696)  Patient Instructions: 1)  make follow up in 4 weeks 2)  Great job on taking your insulin and meeting your goal. 3)  Your new goal is to get blood sugar to 160 and check blood sugar at least twice a day.  Diabetes Self Management Training  PCP: Mliss Sax MD Date diagnosed with diabetes: 09/21/1992 Diabetes Type: Type 2 insulin Other persons present: no Current smoking Status: current  Vital Signs Todays Weight: 250.4lb  in BMI 40.56in-lbs   Assessment Work Hours: Not currently working Sources of Support: son who is 28 still lives with her.   Potential Barriers  Transportation  Economic/Supplies  Needs review/assistance  Diabetes Medications:  Comments: 285 this am 8:45, 202 her ein office on [place]- she didn't eat anyhting and increased her dose to 120 units because her blood sugar was high. She htinks she took a sleepingpill and forgot to take her Pm dose of insulin last night. She is able  to self identify her own gradual progress in tkaing her insulin, being more assertive, chekcing her blood sugar and using the results of self monitoring.    Monitoring Self monitoring blood glucose 2 times a day Name of Meter  True Track Measures urine ketones? No  Time of Testing  After Dinner  Recent Episodes of: Requiring Help from another person  Hyperglycemia : Yes Hypoglycemia: No Severe Hypoglycemia : No      Nutrition assessment What beverages do you drink?  still owkrign on drinking less regular soda and night eating  Activity Limitations  Inadequate physical activity  Type of physical activity  ADL's only Diabetes Disease  Process  Discussed today State own type of diabetes: Demonstrates competency Medications State appropriate timing of food related to medication: Demonstrates competency Nutritional Management State changes planned for home meals/snacks: Demonstrates competency    Monitoring State purpose and frequency of monitoring BG-ketones-HgbA1C  : Demonstrates competencyState target blood glucose and HgbA1C goals: Demonstrates competency Complications Explain proper treatment of hyperglycemia: Needs review/assistance   State the causes- signs and symptoms and prevention of hypoglycemia: Needs review/assistanceExplain proper treatment of hypoglycemia: Needs review/assistanceGlucose control and the development/prevention of long-term complications: Demonstrates competencyState benefits-risks-and options for improving blood sugar control: Demonstrates competencyB/P and lipid control in the prevention/control of cardiovascular disease: Needs review/assistance    Exercise States importance of exercise: Demonstrates competencyStates effect of exercise on blood glucose: Demonstrates competency Lifestyle changes:Goal setting and Problem solving List at least two appropriate community resources: Needs review/assistance   Identify Family/SO role in managing diabetes: Needs review/assistance    Psychosocial Adjustment State three common feelings that might be experienced when learning to cope with diabetes: Needs review/assistance   Identify two methods to cope with these feelings: Needs review/assistance   Identify how stress affects diabetes & two sources of stress: Needs review/assistance   Name two ways of obtaining support from family/friends: Needs review/assistance   Diabetes Management Education Done: 11/27/2009    BEHAVIORAL GOAL FOLLOW UP Utilizing medications if for therapeutic effectiveness: Most of the time Monitoring blood glucose levels daily: 50%of the time Specific goal set today: still  working on  self monitoring and working through denial.       Discussed increased acitivyt- arthritic back seen by patient as barrier- requested evaluation and recommendations on what and howmuch acitivty are appropriate. Also, consideration of an insulin sentitizer. Support and encouragement provided.  Diabetes Self Management Support: niece, clinic staff Follow-up:4 weeks

## 2011-01-21 NOTE — Assessment & Plan Note (Signed)
Summary: np6/chest pain/jss   Primary Provider:  Mliss Sax MD  CC:  pt states she is having chest pains today.  History of Present Illness: 50 year old female for evaluation of chest pain. The patient has no prior cardiac history. She does have dyspnea on exertion relieved with rest. There is no orthopnea or PND but there is pedal edema. Over the past year she has had intermittent left upper chest pain. It is not a sharp pain by her report. Over the past one month it has been continuous. It is not pleuritic but it can increase with bending over. There is no associated nausea, vomiting or shortness of breath. There are no relieving factors. She also has pain in her legs bilaterally with ambulation. Because of the above we are asked to further evaluate.  Preventive Screening-Counseling & Management  Alcohol-Tobacco     Smoking Status: current  Current Medications (verified): 1)  Atenolol 100 Mg Tabs (Atenolol) .... Take 1 Tablet By Mouth Once A Day 2)  Lisinopril 20 Mg Tabs (Lisinopril) .... Take 1 Tablet By Mouth Once A Day 3)  Hydrochlorothiazide 25 Mg Tabs (Hydrochlorothiazide) .... Take 1 Tablet By Mouth Once A Day 4)  Advair Diskus 250-50 Mcg/dose Misc (Fluticasone-Salmeterol) .... One Puff Two Times A Day 5)  Tylenol Extra Strength 500 Mg Tabs (Acetaminophen) .... Take 1 Tablet By Mouth Every 6 Hours As Needed For Pain 6)  Plavix 75 Mg  Tabs (Clopidogrel Bisulfate) .... Take 1 Tablet By Mouth Once A Day 7)  Ra Pen Needles 31g X 8 Mm Misc (Insulin Pen Needle) .... Use 3 Times Per Day To Check The Sugar Level 8)  Lyrica 50 Mg Caps (Pregabalin) .... Take 1 Capsule By Mouth Three Times A Day 9)  Amitriptyline Hcl 75 Mg Tabs (Amitriptyline Hcl) .... Take 1 Tablet By Mouth Two Times A Day 10)  Lyrica 100 Mg Caps (Pregabalin) .... Take 1 Tablet By Mouth Three Times A Day 11)  Tramadol Hcl 50 Mg Tabs (Tramadol Hcl) .... One Tab Up To Four Times A Day For Pain 12)  Doxycycline Hyclate 100 Mg  Caps (Doxycycline Hyclate) .... Take 1 Tablet By Mouth Two Times A Day 13)  Humalog Mix 75/25 Pen 75-25 % Susp (Insulin Lispro Prot & Lispro) .... Inject Under The Skin 110 Units in The Am and 55 Units in The Evening Every Day 14)  Diflucan 150 Mg Tabs (Fluconazole) .... Take 1 Tablet Today and Then Stop 15)  Ambien 10 Mg Tabs (Zolpidem Tartrate) .... Take 1 Tablet Once At Bedtime As Needed For Insomnia 16)  Truetrack Test  Strp (Glucose Blood) .... Use To Check Blood Sugar Twice Daily 17)  Lancets  Misc (Lancets) .... Use To Check Blood Sugar Twice Daily 18)  Lexapro 10 Mg Tabs (Escitalopram Oxalate) .Marland Kitchen.. 1 Tablet By Mouth Every Morning 19)  Nitrostat 0.4 Mg Subl (Nitroglycerin) .... Take One Tablet Under Your Tongue With Severe Chest Pain, Repeat in 5 Minutes. Attend To Doctor/hospital If Had To Take More Then 3 in 20 Minutes  Allergies: 1)  ! Morphine Sulfate (Morphine Sulfate) 2)  ! 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 Mother  DM2, CHF, living Father asthma, living Brothers and sisters asthma since childhood Younger sister had heart attack at age of 25, since then she is doing well.   Social History: Reviewed history from 03/28/2009 and no changes required. Lives with daughter-in-law in apartment, currently unemployed Divorced  Tobacco Use - Yes.  Alcohol Use - no  Review of Systems       Problems with low back pain and claudication but no fevers or chills, productive cough, hemoptysis, dysphasia, odynophagia, melena, hematochezia, dysuria, hematuria, rash, seizure activity, orthopnea, PND, pedal edema. Remaining systems are negative.   Vital Signs:  Patient profile:   50 year old female Height:      66 inches Weight:      251 pounds BMI:     40.66 Pulse rate:   72 / minute Resp:     12 per minute BP sitting:   124 / 78  (left arm)  Vitals Entered By: Kem Parkinson (November 30, 2009 10:32 AM)  Physical Exam  General:  Well developed/obese in NAD Skin warm/dry; tattoo over chest Patient not depressed No peripheral clubbing Back-normal HEENT-normal/normal eyelids Neck supple/normal carotid upstroke bilaterally; no bruits; no JVD; no thyromegaly chest - CTA/ normal expansion CV - RRR/normal S1 and S2; no murmurs, rubs or gallops;  PMI nondisplaced Abdomen -NT/ND, no HSM, no mass, + bowel sounds, no bruit 2+ femoral pulses, no bruits Ext-no edema, chords, 2+ DP on the right and 1+ on the left Neuro-grossly nonfocal     EKG  Procedure date:  11/30/2009  Findings:      Sinus rhythm at a rate of 72. Axis normal. No ST changes noted.  Impression & Recommendations:  Problem # 1:  CHEST PAIN (ICD-786.50) Symptoms extremely atypical. However multiple risk factors including tobacco abuse, diabetes, hypertension and hyperlipidemia. Schedule Myoview for risk stratification. Her updated medication list for this problem includes:    Atenolol 100 Mg Tabs (Atenolol) .Marland Kitchen...  Take 1 tablet by mouth once a day    Lisinopril 20 Mg Tabs (Lisinopril) .Marland Kitchen... Take 1 tablet by mouth once a day    Plavix 75 Mg Tabs (Clopidogrel bisulfate) .Marland Kitchen... Take 1 tablet by mouth once a day    Nitrostat 0.4 Mg Subl (Nitroglycerin) .Marland Kitchen... Take one tablet under your tongue with severe chest pain, repeat in 5 minutes. attend to doctor/hospital if had to take more then 3 in 20 minutes  Orders: Nuclear Stress Test (Nuc Stress Test)  Problem # 2:  CLAUDICATION (ICD-443.9) Patient appears to have claudication. Schedule ABIs.  Problem # 3:  HYPERLIPIDEMIA NEC/NOS (ICD-272.4) Management per primary care.  Problem # 4:  HYPERTENSION, BENIGN ESSENTIAL (ICD-401.1) Blood pressure controlled on present medications. Will continue. Her updated medication list for this problem includes:    Atenolol 100 Mg Tabs (Atenolol) .Marland Kitchen... Take 1 tablet by mouth once a day    Lisinopril 20 Mg Tabs (Lisinopril) .Marland Kitchen... Take 1 tablet by mouth once a day    Hydrochlorothiazide 25 Mg Tabs (Hydrochlorothiazide) .Marland Kitchen... Take 1 tablet by mouth once a day  Problem # 5:  TOBACCO ABUSE (ICD-305.1) Patient counseled on discontinuing for between 3-10 minutes.  Problem # 6:  COPD (ICD-496)  Her updated medication list for this problem includes:    Advair Diskus 250-50 Mcg/dose Misc (Fluticasone-salmeterol) ..... One puff two times a day  Problem # 7:  RENAL FAILURE, ACUTE, HX OF (ICD-V13.09) Patient does have a history of renal insufficiency following dye administration.  Problem # 8:  DM W/MANIFESTATION NEC, TYPE II, UNCONTROLLED (ICD-250.82) Management of diabetes per primary care. Her updated medication list for this problem includes:    Lisinopril 20 Mg Tabs (Lisinopril) .Marland Kitchen... Take 1 tablet by mouth once a day    Humalog Mix 75/25 Pen 75-25 % Susp (Insulin lispro prot & lispro) ..... Inject under the skin 110 units in the am and 55 units in the evening every day  Other Orders: Arterial Duplex Lower Extremity  (Arterial Duplex Low)  Patient Instructions: 1)  Your physician recommends that you schedule a follow-up appointment in: AS NEEDED PENDING TEST RESULTS 2)  Your physician has requested that you have a lower or upper extremity arterial duplex.  This test is an ultrasound of the arteries in the legs or arms.  It looks at arterial blood flow in the legs and arms.  Allow one hour for Lower and Upper Arterial scans. There are no restrictions or special instructions. 3)  Your physician has requested that you have an adenosine myoview.  For further information please visit [email or web address]  Please follow instruction sheet, as given.

## 2011-01-21 NOTE — Progress Notes (Signed)
Summary: diabetes support/dmr  Phone Note Call from Patient Call back at Home Phone [phone]   Caller: Patient Summary of Call: patient called yesterday to cancel her appoitnment with CDE for today. she was working on goal to check her blood sugar twice daily on a consistent basis. Returned call to follow-up on this goal and see how she's doing. left messsage for her to call at her convenience. Initial call taken by: [name] RD,CDE,  November 08, 2009 11:50 AM

## 2011-01-21 NOTE — Progress Notes (Signed)
Summary: insulin orders/dmr  Phone Note From Pharmacy   Caller: [facility] Department Summary of Call: They still have not received faxes needed to fill Jennifer [name]'s insulin prescription. I will  look in scan box and ask Dr. Aldine Contes about them.  Initial call taken by: [name] RD,CDE,  September 27, 2009 2:35 PM  Follow-up for Phone Call        cannot locate prescriptions- will continue to look for them Follow-up by: [name] RD,CDE,  September 27, 2009 4:46 PM  Additional Follow-up for Phone Call Additional follow up Details #1::        obtained prescriptions, faxed them to [facility] successfully.  called and confirmed  receipt. Additional Follow-up by: [name] RD,CDE,  September 28, 2009 2:09 PM

## 2011-01-21 NOTE — Assessment & Plan Note (Signed)
Summary: 1 MONTH FU/VS   Vital Signs:  Patient Profile:   50 Years Old Female Height:     66 inches (167.64 cm) Weight:      228.19 pounds (103.72 kg) Temp:     97 degrees F (36.11 degrees C) oral Pulse rate:   52 / minute BP sitting:   133 / 70  (right arm)  Pt. in pain?   yes    Location:   hip    Intensity:   9    Type:       stabbing  Vitals Entered By: Angelina Ok RN (May 24, 2007 10:18 AM)              Is Patient Diabetic? Yes  Nutritional Status BMI of 25 - 29 = overweight CBG Result 193  Have you ever been in a relationship where you felt threatened, hurt or afraid?No   Does patient need assistance? Functional Status Self care Ambulation Normal  Prescriptions: NOVOLOG MIX 70/30 70-30 % SUSP (INSULIN ASP PROT & ASP (HUM)) 50 units subcutaneously every pm  #as needed x 3   Entered and Authorized by:   Peggye Pitt MD   Signed by:   Peggye Pitt MD on 05/24/2007   Method used:   Print then Give to Patient   RxID:   [id number] NOVOLOG MIX 70/30 70-30 % SUSP (INSULIN ASP PROT & ASP (HUM)) 100 units Subcutaneously every am  #as needed x 3   Entered and Authorized by:   Peggye Pitt MD   Signed by:   Peggye Pitt MD on 05/24/2007   Method used:   Print then Give to Patient   RxID:   [id number] HYDROCHLOROTHIAZIDE 25 MG TABS (HYDROCHLOROTHIAZIDE) Take 1 tablet by mouth once a day  #30 x 0   Entered and Authorized by:   Peggye Pitt MD   Signed by:   Peggye Pitt MD on 05/24/2007   Method used:   Print then Give to Patient   RxID:   [id number] LISINOPRIL 20 MG TABS (LISINOPRIL) Take 1 tablet by mouth once a day  #30 x 0   Entered and Authorized by:   Peggye Pitt MD   Signed by:   Peggye Pitt MD on 05/24/2007   Method used:   Print then Give to Patient   RxID:   [id number] ATENOLOL 100 MG TABS (ATENOLOL) Take 1 tablet by mouth once a day  #30 x 0   Entered and Authorized by:   Peggye Pitt MD   Signed  by:   Peggye Pitt MD on 05/24/2007   Method used:   Print then Give to Patient   RxID:   [id number]    PCP:  Peggye Pitt MD  Chief Complaint:  Follow up.  History of Present Illness: Mrs. Rankin is a 51 y/o woman with a history of very uncontrolled and complicated DM who is basically coming in today for evaluation of back/hip pain.   The pain is low back pain.  The pain began gradually.  The pain radiates to right hip and right leg/foot.  Symptoms are made worse with activity.  The pain is improved with sitting or bending forward.  This is suspicious for spinal stenosis. Previous xrays of back/hip ordered by Dr. Janee Morn were negative for pathology.  Current Allergies: ! MORPHINE SULFATE (MORPHINE SULFATE) ! * IODINATED CONTRAST    Risk Factors:  Tobacco use:  current    Year started:  years    Counseled to quit/cut  down tobacco use:  yes Seatbelt use:  100 %  Mammogram History:    Date of Last Mammogram:  07/26/2004  PAP Smear History:    Date of Last PAP Smear:  08/17/2001    Physical Exam  General:     Well-developed,well-nourished,in no acute distress; alert,appropriate and cooperative throughout examination, limping with hand over back. Lungs:     Normal respiratory effort, chest expands symmetrically. Lungs are clear to auscultation, no crackles or wheezes. Heart:     Normal rate and regular rhythm. S1 and S2 normal without gallop, murmur, click, rub or other extra sounds. Extremities:     No clubbing, cyanosis, edema, or deformity noted with normal full range of motion of all joints.   Neurologic:     alert & oriented X3, cranial nerves II-XII intact, strength normal in all extremities, DTRs symmetrical and normal, RLE sensory loss, and LUE sensory loss.  SLR negative Bilaterally.    Impression & Recommendations:  Problem # 1:  BACK PAIN (ICD-724.5) As described in HPI looks like most likely pathology is spinal stenosis with radiculopathy.  Have ordered an MRI with and without contrast. Will check BMET to ensure renal function remains adequate. MRI to be done tomorrow. Patient will return to discuss results of tests. At that time we will discuss all of her other medical issues.  Her updated medication list for this problem includes:    Naprosyn 500 Mg Tabs (Naproxen) .Marland Kitchen... Take 1 tablet by mouth two times a day    Flexeril 10 Mg Tabs (Cyclobenzaprine hcl) .Marland Kitchen... 1 tablet at bedtime    Tylenol Extra Strength 500 Mg Tabs (Acetaminophen) .Marland Kitchen... Take 1 tablet by mouth every 6 hours as needed for pain  Orders: MRI (MRI)   Problem # 2:  HYPERTENSION, BENIGN ESSENTIAL (ICD-401.1) Have given her samples BP remains under good control.  Her updated medication list for this problem includes:    Atenolol 100 Mg Tabs (Atenolol) .Marland Kitchen... Take 1 tablet by mouth once a day    Lisinopril 20 Mg Tabs (Lisinopril) .Marland Kitchen... Take 1 tablet by mouth once a day    Hydrochlorothiazide 25 Mg Tabs (Hydrochlorothiazide) .Marland Kitchen... Take 1 tablet by mouth once a day   Problem # 3:  DM W/MANIFESTATION NEC, TYPE II, UNCONTROLLED (ICD-250.82) Have refilled her insulin today. Will have to go to the health department to get them refilled as she has not turned in tax paperwork to [employer]. Will discuss further at next visit.  Her updated medication list for this problem includes:    Lisinopril 20 Mg Tabs (Lisinopril) .Marland Kitchen... Take 1 tablet by mouth once a day    Novolog Mix 70/30 70-30 % Susp (Insulin asp prot & asp (hum)) .Marland KitchenMarland KitchenMarland KitchenMarland Kitchen 100 units subcutaneously every am    Novolog Mix 70/30 70-30 % Susp (Insulin asp prot & asp (hum)) .Marland KitchenMarland KitchenMarland KitchenMarland Kitchen 50 units subcutaneously every pm  Orders: Capillary Blood Glucose (82948) Fingerstick (36416) PFT's Baseline w/ DLCO (PFT's Baseline-DLCO)   Other Orders: T-Basic Metabolic Panel [id number])   Patient Instructions: 1)  Please schedule a follow-up appointment in 2 weeks with results of MRI. 2)  Take your blood sugar at least once a day,  every day!!    Last LDL:                                                 126 (  04/02/2007 6:47:00 PM)

## 2011-01-21 NOTE — Progress Notes (Signed)
Summary: no transportation/dmr  Phone Note Outgoing Call   Call placed by: [name] RD,CDE,  January 28, 2010 1:26 PM Summary of Call: called patient due to her canceling and not rescheduling doctor and CDE appoitnment today.  Follow-up for Phone Call        patient returned call and told me that until her transportation gets better- she cannot rescheduel her appointments wiht the physician and CDE. I suggested a referral to our Child psychotherapist for assistance/suggestions for alternative transportation. Patient agreed to social workers help with transporation.  will forward this note to Dorothe Pea, MSW. Follow-up by: [name] RD,CDE,  January 28, 2010 3:53 PM  Additional Follow-up for Phone Call Additional follow up Details #1::        i called patient  about her transportation. she said " cannot seem to maintain low sugars, got up to 220 units because blood sugar has been sky high last night it was 399"  thursday. says transportation is stil sick, but agrees to try to arrange some to be herer Thursday to see doctor and CDE.   will request front office arrange this and call patient with appointment times  Additional Follow-up by: [name] RD,CDE,  February 04, 2010 11:44 AM    Additional Follow-up for Phone Call Additional follow up Details #2::    appointment scheduled for 2/17 at 10 for CDE and 10:45 with Dr. Gwenlyn Perking- patient called with appoint,ents and said she could make it to them. Follow-up by: [name] RD,CDE,  February 04, 2010 4:27 PM

## 2011-01-21 NOTE — Assessment & Plan Note (Signed)
Summary: EST-CK/FU/MEDS/CFB   Vital Signs:  Patient Profile:   50 Years Old Female Weight:      228.0 pounds (103.64 kg) Temp:     96.9 degrees F (36.06 degrees C) oral Pulse rate:   63 / minute BP supine:   140 / 82  (right arm)  Pt. in pain?   yes    Location:   all over the body    Intensity:   10    Type:       aching  Vitals Entered By: Geannie Risen RN (April 02, 2007 9:52 AM)              Is Patient Diabetic? Yes  Nutritional Status Obese CBG Result 156  Does patient need assistance? Functional Status Self care Ambulation Normal   PCP:  Jennifer Pitt MD  Chief Complaint:  follow up visit; neuropathy hurting.  History of Present Illness: Jennifer [name] is a 50 y/o with a multitude of medical problems, most concerning for complicated and uncontrolled DM type II with h/o diabetic foot ulcers with peripheral neuropathy. She is coming in today basically for medication refills and a regular follow-up visit. She is complaining of increasing stocking-glove neuropathy with pain and pins and needle sensation as well as numbness.  Of note, she again fails to bring in her glucometer. Her Hb A1C has crept up to 8.4 from 7.4. I believe this is mainly 2/2 to the fact that she is missing a few doses of her insulin because she does not like to inject herself. She also misses a few doses of her antihypertensives. She usually takes them at work and is sometimes too busy to remember.  Prior Medications: ATENOLOL 100 MG TABS (ATENOLOL) Take 1 tablet by mouth once a day LISINOPRIL 20 MG TABS (LISINOPRIL) Take 1 tablet by mouth once a day HYDROCHLOROTHIAZIDE 25 MG TABS (HYDROCHLOROTHIAZIDE) Take 1 tablet by mouth once a day LYRICA 100 MG CAPS (PREGABALIN) Take 1 tablet by mouth three times a day ADVAIR DISKUS 250-50 MCG/DOSE MISC (FLUTICASONE-SALMETEROL) One puff two times a day ALBUTEROL 90 MCG/ACT AERS (ALBUTEROL) one puff every 6 hrs as needed for wheezing NOVOLOG MIX 70/30 70-30 %  SUSP (INSULIN ASP PROT & ASP (HUM)) 100 units Subcutaneously every am NOVOLOG MIX 70/30 70-30 % SUSP (INSULIN ASP PROT & ASP (HUM)) 50 units subcutaneously every pm Current Allergies (reviewed today): ! MORPHINE SULFATE (MORPHINE SULFATE) ! * IODINATED CONTRASTPrescriptions: HYDROCHLOROTHIAZIDE 25 MG TABS (HYDROCHLOROTHIAZIDE) Take 1 tablet by mouth once a day  #30 x 0   Entered and Authorized by:   Jennifer Pitt MD   Signed by:   Jennifer Pitt MD on 04/02/2007   Method used:   Samples Given   RxID:   [id number] LISINOPRIL 20 MG TABS (LISINOPRIL) Take 1 tablet by mouth once a day  #30 x 0   Entered and Authorized by:   Jennifer Pitt MD   Signed by:   Jennifer Pitt MD on 04/02/2007   Method used:   Samples Given   RxID:   [id number] ATENOLOL 100 MG TABS (ATENOLOL) Take 1 tablet by mouth once a day  #30 x 0   Entered and Authorized by:   Jennifer Pitt MD   Signed by:   Jennifer Pitt MD on 04/02/2007   Method used:   Samples Given   RxID:   [id number]    Past Medical History:    Current Problems:     ABSCESS, PERIRECTAL, HX OF (ICD-V13.3)  RENAL FAILURE, ACUTE, HX OF (ICD-V13.09)    TOBACCO ABUSE (ICD-305.1)        COPD (ICD-496)    HYPERLIPIDEMIA NEC/NOS (ICD-272.4)        TRANSAMINASES, SERUM, ELEVATED (ICD-790.4)    Diabetes mellitus type II        DIABETIC PERIPHERAL NEUROPATHY (ICD-250.60)        DIABETIC FOOT ULCER, TOE (ICD-250.80)    HYPERTENSION, BENIGN ESSENTIAL (ICD-401.1)        Risk Factors:  Tobacco use:  current   Review of Systems  The patient denies anorexia, fever, weight loss, chest pain, syncope, dyspnea on exhertion, peripheral edema, abdominal pain, melena, hematochezia, hematuria, and incontinence.     Physical Exam  General:     Well-developed,well-nourished,in no acute distress; alert,appropriate and cooperative throughout examination Head:     Normocephalic and atraumatic without obvious abnormalities.  No apparent alopecia or balding. Eyes:     No corneal or conjunctival inflammation noted. EOMI. Perrla. Vision grossly normal. Ears:     External ear exam shows no significant lesions or deformities.  Otoscopic examination reveals clear canals, tympanic membranes are intact bilaterally without bulging, retraction, inflammation or discharge. Hearing is grossly normal bilaterally. Nose:     External nasal examination shows no deformity or inflammation. Nasal mucosa are pink and moist without lesions or exudates. Mouth:     Oral mucosa and oropharynx without lesions or exudates.  Teeth in good repair. Neck:     No deformities, masses, or tenderness noted. Lungs:     Normal respiratory effort, chest expands symmetrically. Lungs are clear to auscultation, no crackles or wheezes. Heart:     Normal rate and regular rhythm. S1 and S2 normal without gallop, murmur, click, rub or other extra sounds. Abdomen:     Bowel sounds positive,abdomen soft and non-tender without masses, organomegaly or hernias noted. Extremities:     No clubbing, cyanosis, edema, or deformity noted with normal full range of motion of all joints.   Neurologic:     No cranial nerve deficits noted. Station and gait are normal. Plantar reflexes are down-going bilaterally. DTRs are symmetrical throughout. Sensory, motor and coordinative functions appear intact.    Impression & Recommendations:  Problem # 1:  DM W/MANIFESTATION NEC, TYPE II, UNCONTROLLED (ICD-250.82) Unfortunately her A1c has increased to 8.4. I believe this is because she misses doses of insulin. We have talked about how important it is to take her insulin to avoid the complications of DM. She says she will try harder. We will aslo set her up to see [name] again as I believe she may help reinforce the importance of  her insulin and to help with making better food choices. Again, stressed the importance of the blood sugar meter. She now has project access, so  we will set her up with an ophtalmology referral for an annual diabetic eye exam. Unfortunately, we do not have a podiatrist in project access and she cannot pay out of pocket. We will hold on podiatry referral for now. Last microalbumin in 07/07 was 0.24 well within normal limits.  Her updated medication list for this problem includes:    Lisinopril 20 Mg Tabs (Lisinopril) .Marland Kitchen... Take 1 tablet by mouth once a day    Novolog Mix 70/30 70-30 % Susp (Insulin asp prot & asp (hum)) .Marland KitchenMarland KitchenMarland KitchenMarland Kitchen 100 units subcutaneously every am    Novolog Mix 70/30 70-30 % Susp (Insulin asp prot & asp (hum)) .Marland KitchenMarland KitchenMarland KitchenMarland Kitchen 50 units subcutaneously every pm  Orders:  T-Comprehensive Metabolic Panel [phone]) T-Lipid Profile [phone]) Ophthalmology Referral (Ophthalmology)  Labs Reviewed: HgBA1c: 8.4 (04/02/2007)   Creat: 0.63 (12/30/2006)      Problem # 2:  TOBACCO ABUSE (ICD-305.1) Have not discussed at this visit. Will address next time.  Problem # 3:  COPD (ICD-496) She carries a diagnosis of COPD and is on albuterol and advair, however she has never had PFTs done. Will schedule at today's visit.  Her updated medication list for this problem includes:    Advair Diskus 250-50 Mcg/dose Misc (Fluticasone-salmeterol) ..... One puff two times a day    Albuterol 90 Mcg/act Aers (Albuterol) ..... One puff every 6 hrs as needed for wheezing  Orders: PFT Baseline-Pre/Post Bronchodiolator (PFT Baseline-Pre/Pos)   Problem # 4:  HYPERLIPIDEMIA NEC/NOS (ICD-272.4) Used to be on statin; however was discontinued 2/2 to elevated LFTs. Last FLP in 12/06. Will recheck today as patient is fasting, and decide whether I would like to re-challenge her with the statin.  Problem # 5:  DIABETIC PERIPHERAL NEUROPATHY (ICD-250.60) This is her main complaint today. She is on Lyrica 100 mg three times a day, which is the max dose, however she states she frequesntly misses doses. Have recommended that she be more consistent with her  medications and that good glycemic control is also key.  Her updated medication list for this problem includes:    Lisinopril 20 Mg Tabs (Lisinopril) .Marland Kitchen... Take 1 tablet by mouth once a day    Novolog Mix 70/30 70-30 % Susp (Insulin asp prot & asp (hum)) .Marland KitchenMarland KitchenMarland KitchenMarland Kitchen 100 units subcutaneously every am    Novolog Mix 70/30 70-30 % Susp (Insulin asp prot & asp (hum)) .Marland KitchenMarland KitchenMarland KitchenMarland Kitchen 50 units subcutaneously every pm   Problem # 6:  HYPERTENSION, BENIGN ESSENTIAL (ICD-401.1) BP remains elevated, especially for a diabetic. Again she has missed doses of her antihypertensives. Have given her samples from the clinic. Will recheck at next visit. Her updated medication list for this problem includes:    Atenolol 100 Mg Tabs (Atenolol) .Marland Kitchen... Take 1 tablet by mouth once a day    Lisinopril 20 Mg Tabs (Lisinopril) .Marland Kitchen... Take 1 tablet by mouth once a day    Hydrochlorothiazide 25 Mg Tabs (Hydrochlorothiazide) .Marland Kitchen... Take 1 tablet by mouth once a day  BP today: 140/82  Labs Reviewed: Creat: 0.63 (12/30/2006)   Problem # 7:  Preventive Health Care (ICD-V70.0) Have scheduled mammogram today. Still not at age for screening colonoscopy. FLP to be done today Microalbumin due 07/08 Need to discuss pap smear at next visit.  Other Orders: T- Capillary Blood Glucose ([id number]) T-Hgb A1C (in-house) (10932TF) Mammogram (Screening) (Mammo)   Patient Instructions: 1)  Please schedule a follow-up appointment in 3 months. 2)  Remember to take your insulin and blood pressure medications every day!! 3)  We will schedule an appointment with [name].  Laboratory Results   Blood Tests   Date/Time Recieved: April 02, 2007 10:09 AM  Date/Time Reported: April 02, 2007 10:09 AM ..................................................................Marland KitchenOren Beckmann  April 02, 2007 10:09 AM   HGBA1C: 8.4%   (Normal Range: Non-Diabetic - 3-6%   Control Diabetic - 6-8%) CBG Random: 156

## 2011-01-21 NOTE — Progress Notes (Signed)
Summary: patient assistance  Patient assistance application for Novolog 70/30 mix and Plavix mailed today.Jennifer [name]   BA.,CPht II  May 01, 2009 10:31 AM

## 2011-01-21 NOTE — Miscellaneous (Signed)
  Clinical Lists Changes  Medications: Added new medication of LYRICA 100 MG CAPS (PREGABALIN) Take 1 tablet by mouth three times a day - Signed Rx of LYRICA 100 MG CAPS (PREGABALIN) Take 1 tablet by mouth three times a day;  #360 x 3 ;  Signed;  Entered by: Julaine Fusi  DO;  Authorized by: Julaine Fusi  DO;  Method used: Print then Give to Patient    Prescriptions: LYRICA 100 MG CAPS (PREGABALIN) Take 1 tablet by mouth three times a day  #360 x 3    Entered and Authorized by:   Julaine Fusi  DO   Signed by:   Julaine Fusi  DO on 07/25/2009   Method used:   Print then Give to Patient   RxID:   [id number]

## 2011-01-21 NOTE — Progress Notes (Signed)
Summary: Soc. Work  Nurse, children's placed by: Soc. Work Emergency planning/management officer of [employer]: I gave Pieper the information for [employer] and asked if she would let either [name] R or myself know when she has scheduled an appointment for counseling.  Boneta Lucks was familiar with this agency as she had taken her son there years ago.   Loyal will call me if she has difficulty getting an appmt.  Loralie was receptive to the information and plans to call there. SW will follow up.

## 2011-01-21 NOTE — Assessment & Plan Note (Signed)
Summary: FU OV/VS   Vital Signs:  Patient Profile:   50 Years Old Female Height:     66 inches (167.64 cm) Weight:      244.4 pounds (111.09 kg) BMI:     39.59 Temp:     97.7 degrees F (36.50 degrees C) oral Pulse rate:   69 / minute BP sitting:   151 / 74  (right arm)  Pt. in pain?   yes    Location:   R-HIP/ LEG    Intensity:   10+    Type:       ALL  Vitals Entered By: Theotis Barrio (September 13, 2007 3:41 PM)              Is Patient Diabetic? Yes  Nutritional Status NORMAL CBG Result 101  Have you ever been in a relationship where you felt threatened, hurt or afraid?No   Does patient need assistance? Functional Status Self care Ambulation Normal     PCP:  Jennifer Pitt MD  Chief Complaint:  MEDICATION REFILL / COMPLAIN OF RIGHT HIP AND LEG PAIN / GETTING HARDER TO WALK.  History of Present Illness: Jennifer [name] is a 50 y/o woman here for a followup appointment. She has a complicated PMH significant for HTN, uncontrolled DM, PVD. Recently she came to clinic with numbness of the right side of her face and arm. She was diagnosed with Bell's Palsy and treated with a course of prednisone. She also received an MRI which reads as follows: 1.  No evidence of acute ischemia. 2.  Nonspecific subcortical white matter changes supratentorially.   This may represent areas of ischemic gliosis due to small-vessel  disease due to hypertension and/or diabetes versus a demyelinating  process or a vasculitis.   2/2 to the results of the MRI, we feel she needs a w/u for vasculitic disorders and for multiple sclerosis.  She is also c/o increased pain on both LE (R>L) with walking that relieves with rest. She does have a h/o PVD with prior angioplasty with stent placement.    Current Allergies: ! MORPHINE SULFATE (MORPHINE SULFATE) ! * IODINATED CONTRAST    Risk Factors:  Tobacco use:  current    Year started:  years Alcohol use:  no Exercise:  no Seatbelt use:  100  %  Mammogram History:    Date of Last Mammogram:  07/26/2004  PAP Smear History:    Date of Last PAP Smear:  08/17/2001   Review of Systems  The patient denies fever, weight loss, chest pain, dyspnea on exhertion, abdominal pain, melena, hematochezia, and severe indigestion/heartburn.     Physical Exam  General:     Well-developed,well-nourished,in no acute distress; alert,appropriate and cooperative throughout examination Neck:     No goiter/bruits. Lungs:     Normal respiratory effort, chest expands symmetrically. Lungs are clear to auscultation, no crackles or wheezes. Heart:     Normal rate and regular rhythm. S1 and S2 normal without gallop, murmur, click, rub or other extra sounds. Extremities:     Very decreased (absent) pedal pulses.    Impression & Recommendations:  Problem # 1:  CLAUDICATION (ICD-443.9) ABIs ordered today.  Orders: LE Arterial Doppler/ABI (LE arterial doppler)   Problem # 2:  BELL'S PALSY (ICD-351.0) I really doubt this is the diagnosis for her symptoms given they have resolved completely in a short period of time. Her white matter changes on MRI could (and probably are) be a consequence of her longstanding uncontrolled DM and  HTN, however I feel she warrants a more thorough workup. For this reason, will be ordering ESR and ANA to screen for AI/vasculitic disorders as well as an LP under fluoroscopy to screen for MS with oligoclonal bands. This has been scheduled today.  Orders: T-Sed Rate (Automated) [phone]) T-Antinuclear Antib (ANA) [phone]) Diagnostic X-Ray/Fluoroscopy (Diagnostic X-Ray/Flu)   Problem # 3:  DM W/MANIFESTATION NEC, TYPE II, UNCONTROLLED (ICD-250.82) Again, does not bring in her meter. She is currently under a lot of social stressors including the recent loss of her home and unpaid taxes, for which reason Jennifer [name] wasn't able to give her her insulin supply. She has received that today and will start with her  regular insulin regimen. She will return in 2 weeks with her meter for further assessment and medication adjustments. Followup on microalbumin, eye and foot exams on next visit.  Her updated medication list for this problem includes:    Lisinopril 20 Mg Tabs (Lisinopril) .Marland Kitchen... Take 1 tablet by mouth once a day    Novolog Mix 70/30 70-30 % Susp (Insulin asp prot & asp (hum)) .Marland KitchenMarland KitchenMarland KitchenMarland Kitchen 100 units subcutaneously every am    Novolog Mix 70/30 70-30 % Susp (Insulin asp prot & asp (hum)) .Marland KitchenMarland KitchenMarland KitchenMarland Kitchen 50 units subcutaneously every pm  Orders: Capillary Blood Glucose ([id number]) T-Comprehensive Metabolic Panel ([id number]) T-CBC No Diff ([id number])  Future Orders: T-Lipid Profile ([id number]) ... 09/16/2007  Labs Reviewed: HgBA1c: 8.2 (08/17/2007)   Creat: 0.80 (08/11/2007)      Problem # 4:  HYPERLIPIDEMIA NEC/NOS (ICD-272.4) FLP ordered today.  Orders: Capillary Blood Glucose ([id number]) T-Comprehensive Metabolic Panel ([id number]) T-CBC No Diff ([id number])  Future Orders: T-Lipid Profile ([id number]) ... 09/16/2007   Problem # 5:  TRANSAMINASES, SERUM, ELEVATED (ICD-790.4) CMET today.  Problem # 6:  DIABETIC PERIPHERAL NEUROPATHY (ICD-250.60) On lyrica.  Her updated medication list for this problem includes:    Lisinopril 20 Mg Tabs (Lisinopril) .Marland Kitchen... Take 1 tablet by mouth once a day    Novolog Mix 70/30 70-30 % Susp (Insulin asp prot & asp (hum)) .Marland KitchenMarland KitchenMarland KitchenMarland Kitchen 100 units subcutaneously every am    Novolog Mix 70/30 70-30 % Susp (Insulin asp prot & asp (hum)) .Marland KitchenMarland KitchenMarland KitchenMarland Kitchen 50 units subcutaneously every pm   Problem # 7:  HYPERTENSION, BENIGN ESSENTIAL (ICD-401.1) BP elevated, but has been out of meds. Have given samples from clinic.  Her updated medication list for this problem includes:    Atenolol 100 Mg Tabs (Atenolol) .Marland Kitchen... Take 1 tablet by mouth once a day    Lisinopril 20 Mg Tabs (Lisinopril) .Marland Kitchen... Take 1 tablet by mouth once a day    Hydrochlorothiazide 25 Mg Tabs (Hydrochlorothiazide) .Marland Kitchen...  Take 1 tablet by mouth once a day  BP today: 151/74 Prior BP: 156/96 (08/17/2007)  Labs Reviewed: Creat: 0.80 (08/11/2007) Chol: 184 (04/02/2007)   HDL: 46 (04/02/2007)   LDL: 126 (04/02/2007)   TG: 61 (04/02/2007)   Problem # 8:  HEALTH SCREENING (ICD-V70.0) Deferred to next visit.  Complete Medication List: 1)  Atenolol 100 Mg Tabs (Atenolol) .... Take 1 tablet by mouth once a day 2)  Lisinopril 20 Mg Tabs (Lisinopril) .... Take 1 tablet by mouth once a day 3)  Hydrochlorothiazide 25 Mg Tabs (Hydrochlorothiazide) .... Take 1 tablet by mouth once a day 4)  Lyrica 100 Mg Caps (Pregabalin) .... Take 1 tablet by mouth three times a day 5)  Advair Diskus 250-50 Mcg/dose Misc (Fluticasone-salmeterol) .... One puff two times a day 6)  Albuterol 90 Mcg/act Aers (Albuterol) .... One puff every  6 hrs as needed for wheezing 7)  Novolog Mix 70/30 70-30 % Susp (Insulin asp prot & asp (hum)) .Marland Kitchen.. 100 units subcutaneously every am 8)  Novolog Mix 70/30 70-30 % Susp (Insulin asp prot & asp (hum)) .... 50 units subcutaneously every pm 9)  Naprosyn 500 Mg Tabs (Naproxen) .... Take 1 tablet by mouth two times a day 10)  Tylenol Extra Strength 500 Mg Tabs (Acetaminophen) .... Take 1 tablet by mouth every 6 hours as needed for pain 11)  Ibuprofen 600 Mg Tabs (Ibuprofen) .... Take 1 tablet every 8 hours as needed for pain.  Other Orders: Fingerstick ([id number])   Patient Instructions: 1)  Please schedule a follow-up appointment in 2 weeks. 2)  At that time we will discuss the results of your studies.    ]

## 2011-01-21 NOTE — Assessment & Plan Note (Signed)
Summary: ACUTE-URGENT CARE F/U([name])/[facility]   Vital Signs:  Patient Profile:   50 Years Old Female Height:     66 inches (167.64 cm) Weight:      231.06 pounds (105.03 kg) Temp:     97.4 degrees F (36.33 degrees C) Pulse rate:   56 / minute BP sitting:   122 / 75  (right arm)  Pt. in pain?   yes    Location:   hip    Intensity:   10 t    Type:       sharp  Vitals Entered By: Angelina Ok RN (April 19, 2007 9:07 AM)              Is Patient Diabetic? Yes  CBG Result 215  Have you ever been in a relationship where you felt threatened, hurt or afraid?No   Does patient need assistance? Functional Status Self care Ambulation Normal   PCP:  Peggye Pitt MD  Chief Complaint:  f/u for hip pain UC and bursitis meds not working.  History of Present Illness: Patient here for ED follow up for R hip pain. Patient states she has been having R hip pain x 1 month, which she describes as a burning, sharp pain, constant in nature with no radiation, and tenderness to palpation. Patient states was seen in ED and told she had a bursitis and treated with vicodin.  Patient also c/o lower back pain on ambulation x 1 week, which she describes as pulling, without any radiation and constant in nature.  Patient denies any trauma to back. Patient denies any heavy lifting. Patient denies any numbness ar tingling, no urinary or bowel incontinence, no fever, no weight loss, no urinary sxs, no other associated sxs.  Current Allergies (reviewed today): ! MORPHINE SULFATE (MORPHINE SULFATE) ! * IODINATED CONTRAST Updated/Current Medications (including changes made in today's visit):  ATENOLOL 100 MG TABS (ATENOLOL) Take 1 tablet by mouth once a day LISINOPRIL 20 MG TABS (LISINOPRIL) Take 1 tablet by mouth once a day HYDROCHLOROTHIAZIDE 25 MG TABS (HYDROCHLOROTHIAZIDE) Take 1 tablet by mouth once a day LYRICA 100 MG CAPS (PREGABALIN) Take 1 tablet by mouth three times a day ADVAIR DISKUS 250-50  MCG/DOSE MISC (FLUTICASONE-SALMETEROL) One puff two times a day ALBUTEROL 90 MCG/ACT AERS (ALBUTEROL) one puff every 6 hrs as needed for wheezing NOVOLOG MIX 70/30 70-30 % SUSP (INSULIN ASP PROT & ASP (HUM)) 100 units Subcutaneously every am NOVOLOG MIX 70/30 70-30 % SUSP (INSULIN ASP PROT & ASP (HUM)) 50 units subcutaneously every pm NAPROSYN 500 MG TABS (NAPROXEN) Take 1 tablet by mouth two times a day FLEXERIL 10 MG TABS (CYCLOBENZAPRINE HCL) 1 tablet at bedtime      Review of Systems  The patient denies fever, weight loss, chest pain, syncope, dyspnea on exhertion, peripheral edema, hemoptysis, abdominal pain, melena, and hematochezia.         Per HPI   Physical Exam  General:     alert, well-developed, well-nourished, and normal appearance.   Head:     normocephalic and atraumatic.   Eyes:     vision grossly intact, pupils equal, pupils round, pupils reactive to light, and pupils react to accomodation.   Mouth:     pharynx pink and moist, no erythema, no exudates, no postnasal drip, and no lesions.   Neck:     supple and full ROM.   Lungs:     normal respiratory effort, no intercostal retractions, no accessory muscle use, normal breath sounds, no  crackles, and no wheezes.   Heart:     normal rate, regular rhythm, no murmur, no gallop, no rub, and no JVD.   Abdomen:     soft, non-tender, normal bowel sounds, no distention, no masses, no guarding, no rigidity, and no rebound tenderness.   Extremities:     No c/c/e. R hip with point tenderness.    Detailed Back/Spine Exam  General:    obese.    Gait:    antalgic.    Skin:    No redness, no warmth, no rash.  Inspection:    No deformity, no swelling, no ecchymosis  Palpation:    Lumber spine NTTP.  Lumbosacral Exam:  Inspection-deformity:    Normal Palpation-spinal tenderness:  Normal Range of Motion:    Forward Flexion:   0 degrees Lying Straight Leg Raise:    Right:  positive in back only    Left:   negative Reverse Straight Leg Raise:    Right:  negative    Left:  negative Contralateral Straight Leg Raise:    Right:  negative    Left:  negative     Lumber pain greater on forward flexion than extension.     Impression & Recommendations:  Problem # 1:  BACK PAIN (ICD-724.5) Likely musculoskeletal vs discogenic vs degenrative.  Will check a UA to r/o UTI. Will get plain films of back to rule out degerative disease. Will treat conservatively with naproxen and flexeril. Follow up 1 month, if worsening sxs and no improvement on follow up may consider CT/MRI of lumber region to r/o disc disease. Patient advised if worsening sxs and no relief from pain medications in 2 weeks to call to be seen for reassessment and pain control. Trochanteric bursitis per clinical exam, no s/sx of infection. Continue symptomatic treatment with naproxen, and warm compressors. Her updated medication list for this problem includes:    Naprosyn 500 Mg Tabs (Naproxen) .Marland Kitchen... Take 1 tablet by mouth two times a day    Flexeril 10 Mg Tabs (Cyclobenzaprine hcl) .Marland Kitchen... 1 tablet at bedtime  Orders: T-Urinalysis ([id number]) T-Culture, Urine ([id number]) Diagnostic X-Ray/Fluoroscopy (Diagnostic X-Ray/Flu)   Problem # 2:  HYPERTENSION, BENIGN ESSENTIAL (ICD-401.1) Blood pressure today 122/75, and under excellent control and at goal for diabetic patient. Continue current medication regimen with lisinopril, atenolol, HCTZ.  Her updated medication list for this problem includes:    Atenolol 100 Mg Tabs (Atenolol) .Marland Kitchen... Take 1 tablet by mouth once a day    Lisinopril 20 Mg Tabs (Lisinopril) .Marland Kitchen... Take 1 tablet by mouth once a day    Hydrochlorothiazide 25 Mg Tabs (Hydrochlorothiazide) .Marland Kitchen... Take 1 tablet by mouth once a day   Medications Added to Medication List This Visit: 1)  Naprosyn 500 Mg Tabs (Naproxen) .... Take 1 tablet by mouth two times a day 2)  Flexeril 10 Mg Tabs (Cyclobenzaprine hcl) .Marland Kitchen.. 1 tablet at  bedtime  Other Orders: T- Capillary Blood Glucose ([id number])   Patient Instructions: 1)  Please schedule a follow-up appointment in 1 month with PCP for low back pain. 2)  If no improvement in pain in 2 weeks call back to clinic. 3)  If worsening sxs of lower extremity weakness, numbness, fever, urinary or bowel incontinence call to be seen. 4)  Naproxen 500 mg 1 tablet 2 times daily x 3 weeks, then as needed for low back pain. 5)  Flexeril 10mg  1 tablet at bedtime. 6)  Continue all other current medications.  Laboratory Results   Blood Tests  Date/Time Recieved: April 19, 2007 9:45 AM  Date/Time Reported: ..................................................................Marland KitchenOren Beckmann  April 19, 2007 9:45 AM   CBG Random: 215     Prescriptions: FLEXERIL 10 MG TABS (CYCLOBENZAPRINE HCL) 1 tablet at bedtime  #30 x 2   Entered and Authorized by:   Ramiro Harvest MD   Signed by:   Ramiro Harvest MD on 04/19/2007   Method used:   Telephoned to ...         RxID:   [id number] ALBUTEROL 90 MCG/ACT AERS (ALBUTEROL) one puff every 6 hrs as needed for wheezing  #1 month x 11   Entered and Authorized by:   Ramiro Harvest MD   Signed by:   Ramiro Harvest MD on 04/19/2007   Method used:   Print then Give to Patient   RxID:   [id number] ADVAIR DISKUS 250-50 MCG/DOSE MISC (FLUTICASONE-SALMETEROL) One puff two times a day  #1 month x 11   Entered and Authorized by:   Ramiro Harvest MD   Signed by:   Ramiro Harvest MD on 04/19/2007   Method used:   Print then Give to Patient   RxID:   [id number] NAPROSYN 500 MG TABS (NAPROXEN) Take 1 tablet by mouth two times a day  #62 x 2   Entered and Authorized by:   Ramiro Harvest MD   Signed by:   Ramiro Harvest MD on 04/19/2007   Method used:   Print then Give to Patient   RxID:   [id number]

## 2011-01-21 NOTE — Consult Note (Signed)
[facility]   Imported By: Florinda Marker 11/23/2008 16:01:19  _____________________________________________________________________  External Attachment:    Type:   Image     Comment:   External Document

## 2011-01-21 NOTE — Progress Notes (Signed)
Summary: Pain Clinic  Phone Note Outgoing Call   Call placed by: Angelina Ok RN,  March 08, 2008 4:57 PM Call placed to: Patient Summary of Call: Call to pt to inform her of  the  need to call the Pain Clinics to set up a follow up appointment with them.   Unable to speak with pt but message was left on pt's answering machine to call the Clinics and to ask for [facility]. ..................................................................Marland KitchenAngelina Ok RN  March 08, 2008 5:00 PM  Initial call taken by: Angelina Ok RN,  March 08, 2008 5:00 PM

## 2011-01-21 NOTE — Progress Notes (Signed)
Summary: refill/gg  Phone Note Refill Request  on October 10, 2009 11:11 AM  Refills Requested: Medication #1:  TRAMADOL HCL 50 MG TABS One tab up to four times a day for pain  Method Requested: Telephone to Pharmacy Initial call taken by: Merrie Roof RN,  October 10, 2009 11:12 AM  Follow-up for Phone Call        Rx called to pharmacy Follow-up by: Mliss Sax MD,  October 10, 2009 3:54 PM    Prescriptions: TRAMADOL HCL 50 MG TABS (TRAMADOL HCL) One tab up to four times a day for pain  #50 x 3   Entered and Authorized by:   Mliss Sax MD   Signed by:   Mliss Sax MD on 10/10/2009   Method used:   Telephoned to ...       [facility] Department (retail)       [address] [place], [place]  [address]       Ph: [phone]       Fax: [phone]   RxID:   [id number]

## 2011-01-21 NOTE — Progress Notes (Signed)
Summary: refill/ hla  Phone Note Refill Request Message from:  Patient on January 15, 2010 12:25 PM  Refills Requested: Medication #1:  TRAMADOL HCL 50 MG TABS One tab up to four times a day for pain   Dosage confirmed as above?Dosage Confirmed   Supply Requested: 3 months   Last Refilled: 12/26/2009 Initial call taken by: Marin Roberts RN,  January 15, 2010 1:36 PM  Follow-up for Phone Call        Tramadol lis for knee pain.  Good renal function in 50 y.o.  Will refill for 3 months. Follow-up by: Doneen Poisson MD,  January 15, 2010 2:06 PM    Prescriptions: TRAMADOL HCL 50 MG TABS (TRAMADOL HCL) One tab up to four times a day for pain  #50 x 3   Entered and Authorized by:   Doneen Poisson MD   Signed by:   Doneen Poisson MD on 01/15/2010   Method used:   Faxed to ...       [facility] Department (retail)       [address] [place], [place]  [address]       Ph: [phone]       Fax: [phone]   RxID:   [id number]

## 2011-01-21 NOTE — Progress Notes (Signed)
Summary: patient assistance  Phone Note Refill Request    Follow-up for Phone Call        completed Follow-up by: Mliss Sax MD,  May 16, 2009 2:47 PM      Prescriptions: PLAVIX 75 MG  TABS (CLOPIDOGREL BISULFATE) Take 1 tablet by mouth once a day  #90 x 3   Entered by:   Mliss Sax MD   Authorized by:   Concepcion Elk   BA.,CPht II   Signed by:   Mliss Sax MD on 05/16/2009   Method used:   Samples Given   RxID:   [id number]   Patient Assist Medication Verification: Medication: Plavix 75mg  Lot# [id number] Exp Date:02/2012 Tech approval:NLS                Appended Document: patient assistance Prescription/Samples picked up by: patient

## 2011-01-21 NOTE — Consult Note (Signed)
Summary: [employer] [facility]: : Diabetic Eye Exam  [employer] [facility]: : Diabetic Eye Exam   Imported By: Florinda Marker 07/05/2008 16:03:56  _____________________________________________________________________  External Attachment:    Type:   Image     Comment:   External Document  Appended Document: [employer] [facility]: : Diabetic Eye Exam   Impression & Recommendations:  Problem # 1:  DM W/MANIFESTATION NEC, TYPE II, UNCONTROLLED (ICD-250.82) Patient had diabetic eye exam and it was negative for diabetic retinopathy.  Her updated medication list for this problem includes:    Lisinopril 20 Mg Tabs (Lisinopril) .Marland Kitchen... Take 1 tablet by mouth once a day    Novolog Mix 70/30 70-30 % Susp (Insulin asp prot & asp (hum)) .Marland Kitchen... 120 units subcutaneously every am    Novolog Mix 70/30 70-30 % Susp (Insulin asp prot & asp (hum)) .Marland KitchenMarland KitchenMarland KitchenMarland Kitchen 65 units subcutaneously every pm    Appended Document: [employer] [facility]: : Diabetic Eye Exam    Clinical Lists Changes  Observations: Added new observation of [id number]: 05/2009 (10/13/2008 15:23) Added new observation of DIAB EYE EX: No diabetic retinopathy.    (05/31/2008 15:23)       Diabetic Eye Exam  Procedure date:  05/31/2008  Findings:      No diabetic retinopathy.     Procedures Next Due Date:    Diabetic Eye Exam: 05/2009   Diabetic Eye Exam  Procedure date:  05/31/2008  Findings:      No diabetic retinopathy.     Procedures Next Due Date:    Diabetic Eye Exam: 05/2009  Appended Document: [employer] [facility]: : Diabetic Eye Exam     Procedures Next Due Date:    Diabetic Eye Exam: 05/2009

## 2011-01-21 NOTE — Progress Notes (Signed)
Summary: diabetes support/dmr  Phone Note Outgoing Call   Call placed by: [name] RD,CDE,  October 10, 2009 4:04 PM Summary of Call: Patient prefers insulin pen to syringes and requested that we change prescription for [facility]:  CDE called [facility] and asked them to change her to pens- we noted changed doses to be different than what patient had said she was tkaing for her eveing dose: patient had reported tkaing 120 after breakfgast abd 85 units before bed at tody's visit: Called in order for [facility] for 110 units in am and 65 units in pm before dinner called in to [facility] for Humalog Mix 75/25 insulin  Follow-up for Phone Call        called patient and left message to clarify insulin dose in PM before dinner- left message that it should be 65 units and not 75 or 85 units. requested a return call Follow-up by: [name] RD,CDE,  October 10, 2009 4:30 PM  Additional Follow-up for Phone Call Additional follow up Details #1::        spoke with patient about evening insulin dose- she is now tkaing 55 units 30 minutes before dinner. She reports that blood sugar is down to 180-190 from 400s.  Additional Follow-up by: [name] RD,CDE,  October 12, 2009 4:19 PM    Additional Follow-up for Phone Call Additional follow up Details #2::    I have changed the current insulin that we had on med list to humalog mix 75/25 with the directions per recommendations.  Thank you Follow-up by: Mliss Sax MD,  October 15, 2009 12:32 AM  New/Updated Medications: HUMALOG MIX 75/25 PEN 75-25 % SUSP (INSULIN LISPRO PROT & LISPRO) Inject under the skin 110 units in the AM and 55 units in the evening every day Prescriptions: HUMALOG MIX 75/25 PEN 75-25 % SUSP (INSULIN LISPRO PROT & LISPRO) Inject under the skin 110 units in the AM and 55 units in the evening every day  #1 x 5   Entered by:   Mliss Sax MD   Authorized by:   [name] RD,CDE   Signed by:   Mliss Sax MD on 10/15/2009   Method used:   Faxed to  ...       [facility] Department (retail)       [address] [place], [place]  [address]       Ph: [phone]       Fax: [phone]   RxID:   [id number]

## 2011-01-21 NOTE — Progress Notes (Signed)
Summary: med refill/form completion/gp  Phone Note Refill Request Message from:  Patient on Apr 29, 2010 10:41 AM  Refills Requested: Medication #1:  LYRICA 100 MG CAPS Take 1 tablet by mouth three times a day Pt. receives this med. from [employer] [facility] via mail.  They need a new Rx faxed along with the Enrolled Patient Re-order Form, which is in your box.  Both needs to faxed back to [employer] [phone].  Thanks   Method Requested: Fax   Initial call taken by: Chinita Pester RN,  Apr 29, 2010 10:35 AM  Follow-up for Phone Call        completed refill, thank you Falynn Ailey  Follow-up by: Mliss Sax MD,  Apr 29, 2010 8:34 PM  Additional Follow-up for Phone Call Additional follow up Details #1::        Also, Dr. Aldine Contes can you complete the form Wed. when you are here in the clinic.  Thanks Additional Follow-up by: Chinita Pester RN,  Apr 30, 2010 9:05 AM    Additional Follow-up for Phone Call Additional follow up Details #2::    will do it today  thanks  Marko Skalski Follow-up by: Mliss Sax MD,  May 01, 2010 1:36 PM  Prescriptions: LYRICA 100 MG CAPS (PREGABALIN) Take 1 tablet by mouth three times a day  #360 x 3    Entered and Authorized by:   Mliss Sax MD   Signed by:   Mliss Sax MD on 05/01/2010   Method used:   Print then Give to Patient   RxID:   [id number] LYRICA 100 MG CAPS (PREGABALIN) Take 1 tablet by mouth three times a day  #360 x 3    Entered and Authorized by:   Mliss Sax MD   Signed by:   Mliss Sax MD on 04/29/2010   Method used:   Print then Give to Patient   RxID:   [id number]   Appended Document: med refill/form completion/gp Lyrica Rx and Enrolled Pt. re-order form faxed to [phone].

## 2011-01-21 NOTE — Assessment & Plan Note (Signed)
Summary: EST-TEST RESULTS   Vital Signs:  Patient Profile:   50 Years Old Female Height:     66 inches (167.64 cm) Weight:      228.05 pounds (103.66 kg) Temp:     97.3 degrees F (36.28 degrees C) oral Pulse rate:   67 / minute BP sitting:   145 / 88  (left arm)  Pt. in pain?   yes    Location:   rt side br bone down    Intensity:   8    Type:       aching, sharp, throbbing  Vitals Entered By: Angelina Ok RN (June 14, 2007 10:14 AM)              Is Patient Diabetic? Yes  Nutritional Status BMI of > 30 = obese CBG Result 210  Have you ever been in a relationship where you felt threatened, hurt or afraid?No   Does patient need assistance? Functional Status Self care Ambulation Impaired:Risk for fall   PCP:  Jennifer Pitt MD  Chief Complaint:  REsults of MRI.  History of Present Illness: Jennifer [name] is a 50 y/o woman, here today for results of her lumbar spine MRI. MRI was done because of clinical features suggestive of spinal stenosis. However, MRI only showed some facet joint arthritis, no spinal stenosis or disc herniation. Back still hurts, especially after standing for long periods of time. She has not taken any of her medications today. She is undergoing some financial difficulties: her house is now on foreclosure and she has not filed taxes yet, so Jennifer [name] is unable to assist with medications. "I have a lot going on right now, doctor, and that is why I sometimes forget to take my medicine". She does not appear to be depressed. Mood is stable, she has good appetite and is sleeping well.  Current Allergies: ! MORPHINE SULFATE (MORPHINE SULFATE) ! * IODINATED CONTRAST    Risk Factors:  Tobacco use:  current    Year started:  years    Counseled to quit/cut down tobacco use:  yes Seatbelt use:  100 %  Mammogram History:    Date of Last Mammogram:  07/26/2004  PAP Smear History:    Date of Last PAP Smear:  08/17/2001   Review of Systems  The patient  denies fever, chest pain, dyspnea on exhertion, abdominal pain, and melena.     Physical Exam  General:     Well-developed,well-nourished,in no acute distress; alert,appropriate and cooperative throughout examination Lungs:     Normal respiratory effort, chest expands symmetrically. Lungs are clear to auscultation, no crackles or wheezes. Heart:     Normal rate and regular rhythm. S1 and S2 normal without gallop, murmur, click, rub or other extra sounds.   Detailed Back/Spine Exam  Lumbosacral Exam:  Inspection-deformity:    Normal Palpation-spinal tenderness:  Abnormal Range of Motion:    Forward Flexion:   15 degrees    Hyperextension:   20 degrees    Right Lateral Bend:   20 degrees    Left Lateral Bend:   20 degrees Squatting:  normal Lying Straight Leg Raise:    Right:  negative    Left:  negative Sitting Straight Leg Raise:    Right:  negative    Left:  negative Reverse Straight Leg Raise:    Right:  negative    Left:  negative Contralateral Straight Leg Raise:    Right:  negative    Left:  negative Toe Walking:  Right:  normal    Left:  normal Heel Walking:    Right:  normal    Left:  normal Patrick's Maneuver:    Right:  negative    Left:  negative Fabere Test:    Right:  negative    Left:  negative    Impression & Recommendations:  Problem # 1:  BACK PAIN (ICD-724.5) No surgical intervention required at this point. WIll give a trial of prescription strength ibuprofen. If this does not help, then we can consider physical therapy and possibly referral to the pain center for facet joint injections.  Her updated medication list for this problem includes:    Naprosyn 500 Mg Tabs (Naproxen) .Marland Kitchen... Take 1 tablet by mouth two times a day    Flexeril 10 Mg Tabs (Cyclobenzaprine hcl) .Marland Kitchen... 1 tablet at bedtime    Tylenol Extra Strength 500 Mg Tabs (Acetaminophen) .Marland Kitchen... Take 1 tablet by mouth every 6 hours as needed for pain    Ibuprofen 600 Mg Tabs  (Ibuprofen) .Marland Kitchen... Take 1 tablet every 8 hours as needed for pain.   Problem # 2:  DM W/MANIFESTATION NEC, TYPE II, UNCONTROLLED (ICD-250.82) Badly controlled. She often forgets doses of her medications, even tough she has them. Have again gone over importanceof medication compliance with her. She promises to try harder. Her updated medication list for this problem includes:    Lisinopril 20 Mg Tabs (Lisinopril) .Marland Kitchen... Take 1 tablet by mouth once a day    Novolog Mix 70/30 70-30 % Susp (Insulin asp prot & asp (hum)) .Marland KitchenMarland KitchenMarland KitchenMarland Kitchen 100 units subcutaneously every am    Novolog Mix 70/30 70-30 % Susp (Insulin asp prot & asp (hum)) .Marland KitchenMarland KitchenMarland KitchenMarland Kitchen 50 units subcutaneously every pm  Labs Reviewed: HgBA1c: 8.4 (04/02/2007)   Creat: 0.59 (05/24/2007)      Problem # 3:  HYPERTENSION, BENIGN ESSENTIAL (ICD-401.1) BP not at goal today. However, she has not taken her medications yet. See above.  Her updated medication list for this problem includes:    Atenolol 100 Mg Tabs (Atenolol) .Marland Kitchen... Take 1 tablet by mouth once a day    Lisinopril 20 Mg Tabs (Lisinopril) .Marland Kitchen... Take 1 tablet by mouth once a day    Hydrochlorothiazide 25 Mg Tabs (Hydrochlorothiazide) .Marland Kitchen... Take 1 tablet by mouth once a day  BP today: 145/88 Prior BP: 133/70 (05/24/2007)  Labs Reviewed: Creat: 0.59 (05/24/2007) Chol: 184 (04/02/2007)   HDL: 46 (04/02/2007)   LDL: 126 (04/02/2007)   TG: 61 (04/02/2007)   Medications Added to Medication List This Visit: 1)  Ibuprofen 600 Mg Tabs (Ibuprofen) .... Take 1 tablet every 8 hours as needed for pain.  Other Orders: Capillary Blood Glucose ([address]) Fingerstick ([address])   Patient Instructions: 1)  Please schedule a follow-up appointment in 3 months. 2)  Come back sooner if your back doesn't improve. 3)  REMEMBER TO TAKE YOUR MEDICINES EVERY DAY!! Prescriptions: ADVAIR DISKUS 250-50 MCG/DOSE MISC (FLUTICASONE-SALMETEROL) One puff two times a day  #1 month x 11   Entered and Authorized by:   Jennifer Pitt MD   Signed by:   Jennifer Pitt MD on 06/14/2007   Method used:   Print then Give to Patient   RxID:   [id number] IBUPROFEN 600 MG  TABS (IBUPROFEN) Take 1 tablet every 8 hours as needed for pain.  #120 x 0   Entered and Authorized by:   Jennifer Pitt MD   Signed by:   Jennifer Pitt MD on 06/14/2007   Method used:  Print then Give to Patient   RxID:   [id number]

## 2011-01-21 NOTE — Progress Notes (Signed)
Summary: Refill/gh  Phone Note Refill Request Message from:  Fax from Pharmacy on August 19, 2010 10:40 AM  Refills Requested: Medication #1:  PLAVIX 75 MG  TABS Take 1 tablet by mouth once a day   Last Refilled: 06/10/2010  Method Requested: Fax to Local Pharmacy Initial call taken by: Angelina Ok RN,  August 19, 2010 10:40 AM  Follow-up for Phone Call        completed refill, thank you Davonda Ausley  Follow-up by: Mliss Sax MD,  August 20, 2010 3:15 PM    Prescriptions: PLAVIX 75 MG  TABS (CLOPIDOGREL BISULFATE) Take 1 tablet by mouth once a day  #90 x 3   Entered and Authorized by:   Mliss Sax MD   Signed by:   Mliss Sax MD on 08/20/2010   Method used:   Faxed to ...       [facility] Department (retail)       [address] [place], [place]  [address]       Ph: [phone]       Fax: [phone]   RxID:   [id number]

## 2011-01-21 NOTE — Progress Notes (Signed)
Summary: change paxil to lexapro/ hla  Phone Note Refill Request Message from:  Pharmacy on November 22, 2009 4:12 PM  [facility] can provide lexapro at no charge could we please change this  Initial call taken by: Marin Roberts RN,  November 22, 2009 4:14 PM    New/Updated Medications: LEXAPRO 10 MG TABS (ESCITALOPRAM OXALATE) 1 tablet by mouth every morning Prescriptions: LEXAPRO 10 MG TABS (ESCITALOPRAM OXALATE) 1 tablet by mouth every morning  #30 x 11   Entered and Authorized by:   Doneen Poisson MD   Signed by:   Doneen Poisson MD on 11/22/2009   Method used:   Faxed to ...       [facility] Department (retail)       [address] [place], [place]  [address]       Ph: [phone]       Fax: [phone]   RxID:   [id number]

## 2011-01-21 NOTE — Progress Notes (Signed)
Summary: Soc Work F/U  Phone Note [employer] placed by: Soc. Work Call placed to: Patient Summary of Call: F/U phone call re: counseling.   Follow-up for Phone Call        Boneta Lucks called me and said she lost the information for [facility].  I told her that I would give her information for both [facility] and [facility] and to try [facility] first because [facility] is somewhat behind.   Boneta Lucks was receptive to the information and I will give info to [facility] who sees the patient this PM.  Dorothe Pea  June 03, 2010 11:47 AM

## 2011-01-21 NOTE — Letter (Signed)
Summary: New Patient letter  [facility]  [address] [place], [place] [address]   Phone: [phone]  Fax: [phone]       11/06/2010 MRN: [id number]  Jennifer [name] [address] [place], [place]  [address]  Dear [name],  Welcome to the Gastroenterology Division at [facility].    You are scheduled to see Dr.  Arlyce Dice on 12-26-2010 at 1:30pm on the 3rd floor at [facility], [address]  We ask that you try to arrive at our office 15 minutes prior to your appointment time to allow for check-in.  We would like you to complete the enclosed self-administered evaluation form prior to your visit and bring it with you on the day of your appointment.  We will review it with you.  Also, please bring a complete list of all your medications or, if you prefer, bring the medication bottles and we will list them.  Please bring your insurance card so that we may make a copy of it.  If your insurance requires a referral to see a specialist, please bring your referral form from your primary care physician.  Co-payments are due at the time of your visit and may be paid by cash, check or credit card.     Your office visit will consist of a consult with your physician (includes a physical exam), any laboratory testing he/she may order, scheduling of any necessary diagnostic testing (e.g. x-ray, ultrasound, CT-scan), and scheduling of a procedure (e.g. Endoscopy, Colonoscopy) if required.  Please allow enough time on your schedule to allow for any/all of these possibilities.    If you cannot keep your appointment, please call [phone] to cancel or reschedule prior to your appointment date.  This allows [place] the opportunity to schedule an appointment for another patient in need of care.  If you do not cancel or reschedule by 5 p.m. the business day prior to your appointment date, you will be charged a $50.00 late cancellation/no-show fee.    Thank you for choosing  [facility] for your medical needs.  We appreciate the opportunity to care for you.  Please visit [place] at our website  to learn more about our practice.                     Sincerely,                                                             The Gastroenterology Division

## 2011-01-21 NOTE — Assessment & Plan Note (Signed)
Summary: DM TRAINING/VS  Is Patient Diabetic? Yes Did you bring your meter with you today? Yes   Allergies: 1)  ! Morphine Sulfate (Morphine Sulfate) 2)  ! * Iodinated Contrast   Complete Medication List: 1)  Atenolol 100 Mg Tabs (Atenolol) .... Take 1 tablet by mouth once a day 2)  Lisinopril 20 Mg Tabs (Lisinopril) .... Take 1 tablet by mouth once a day 3)  Hydrochlorothiazide 25 Mg Tabs (Hydrochlorothiazide) .... Take 1 tablet by mouth once a day 4)  Advair Diskus 250-50 Mcg/dose Misc (Fluticasone-salmeterol) .... One puff two times a day 5)  Tylenol Extra Strength 500 Mg Tabs (Acetaminophen) .... Take 1 tablet by mouth every 6 hours as needed for pain 6)  Plavix 75 Mg Tabs (Clopidogrel bisulfate) .... Take 1 tablet by mouth once a day 7)  Ra Pen Needles 31g X 8 Mm Misc (Insulin pen needle) .... Use 3 times per day to check the sugar level 8)  Lyrica 50 Mg Caps (Pregabalin) .... Take 1 capsule by mouth three times a day 9)  Amitriptyline Hcl 75 Mg Tabs (Amitriptyline hcl) .... Take 1 tablet by mouth two times a day 10)  Lyrica 100 Mg Caps (Pregabalin) .... Take 1 tablet by mouth three times a day 11)  Tramadol Hcl 50 Mg Tabs (Tramadol hcl) .... One tab up to four times a day for pain 12)  Doxycycline Hyclate 100 Mg Caps (Doxycycline hyclate) .... Take 1 tablet by mouth two times a day 13)  Paxil 20 Mg Tabs (Paroxetine hcl) .... Take 1 tablet by mouth once a day 14)  Humalog Mix 75/25 Pen 75-25 % Susp (Insulin lispro prot & lispro) .... Inject under the skin 110 units in the am and 55 units in the evening every day 15)  Diflucan 150 Mg Tabs (Fluconazole) .... Take 1 tablet today and then stop  Other Orders: DSMT(Medicare) Individual, 30 Minutes (G0108)  Patient Instructions: 1)  You are doing a great job taking your insulin before breakfast! 2)  Try keeping insulin that you are currently using with meter (not in refrigerator) to help you improve on taking insulin before supper. 3)   try to take insulin and check blood sugar around the same time everyday- you were going to set alaram on phone to go off in morning and evening.  Diabetes Self Management Training  PCP: Mliss Sax MD Date diagnosed with diabetes: 09/[birth year]/1992 Diabetes Type: Type 2 insulin Other persons present: no Current smoking Status: current  Assessment Sources of Support: niece Special needs or Barriers: doesn't have a schedule now that she is not working Affect: Appropriate Readiness to learn:   Action  Potential Barriers  Transportation  Economic/Supplies  Coping Skills  Diabetes Medications:  Comments: Happy that A1C is lower today(10.2%), but wants it to be a "7". checking blood sugar  ~ 1x/day, reports taking morning insulin 90% of the time before breakfast, but 50% before dinner. Forgets dinner insulin often, then falls asleep before taking later. Keeps insulin in the refigerator and forgets it's there. Discussed keeping on tabke with meter and syringe, can even prefill the syringe in the morning wiht PM insulin. CBgs agree with this report in that more often Pm CBG is 100s and often AM CBG is 200-300s.   Mixed/Intermediate  Breakfast Dose: 110  Dinner Dose:60    Monitoring Self monitoring blood glucose 2 times a day Name of Meter  True Track Measures urine ketones? No  Time of Testing  Before Breakfast  Before Dinner  Recent Episodes of: Requiring Help from another person  Hyperglycemia : Yes Hypoglycemia: No Severe Hypoglycemia : No     Diabetes Disease Process  Discussed today  Medications State name-action-dose-duration-side effects-and time to take medication: Needs review/assistance   State appropriate timing of food related to medication: Needs review/assistance    Nutritional Management  Monitoring State purpose and frequency of monitoring BG-ketones-HgbA1C  : Needs review/assistance   State target blood glucose and HgbA1C goals: Needs review/assistance     Lifestyle changes:Goal setting and Problem solving State benefits of making appropriate lifestyle changes: Needs review/assistance   Identify lifestyle behaviors that need to change: Needs review/assistance   Identify risk factors that interfere with health: Needs review/assistance   Develop strategies to reduce risk factors: Needs review/assistance   Diabetes Management Education Done: 10/25/2009    BEHAVIORAL GOALS INITIAL   Specific goal set today: keep insulin out of the refigerator with meter, set alarms on phone as reminder to take insulin  BEHAVIORAL GOAL FOLLOW UP Utilizing medications if for therapeutic effectiveness: 50%of the time      Requesting 2 weeks follow-up rahter than 4 week. to work on twice a day testing rather than 4x/day.  Motivated ot improve blood sugar because she is tired of yeast infections and concerned about toe.  Support and encouragement provided.  Diabetes Self Management Support: niece, clinic staff Follow-up:2 weeks

## 2011-01-21 NOTE — Letter (Signed)
Summary: [facility]: Dr. Dewitt [facility]: Dr. Tanda Rockers   Imported By: Florinda Marker 05/25/2008 11:12:48  _____________________________________________________________________  External Attachment:    Type:   Image     Comment:   External Document

## 2011-01-21 NOTE — Assessment & Plan Note (Signed)
Summary: EST-CK/FU/MED/CFB   Vital Signs:  Patient Profile:   50 Years Old Female Height:     66 inches (167.64 cm) Weight:      245.0 pounds (111.36 kg) BMI:     39.69 Temp:     98.3 degrees F (36.83 degrees C) oral Pulse rate:   73 / minute BP sitting:   115 / 77  (left arm) Cuff size:   large  Vitals Entered By: Krystal Eaton Duncan Dull) (January 29, 2009 10:33 AM)             Is Patient Diabetic? Yes Did you bring your meter with you today? No  Does patient need assistance? Functional Status Self care Ambulation Impaired:Risk for fall Comments cane     PCP:  Mliss Sax MD  Chief Complaint:  boil in vaginal area x2wks.  History of Present Illness: 50 yo lady with uncontrolled diabetes and medical noncomplience, multiple previous skin boils, presents today with 2 vaginal boils that have started 2 weeks ago, and have been getting progressively better. She has triet to squeeze the pus out and was able to do so in the beginning and is not able to do so now. She still experiences pain to touch, and slight warmth at the area. The boils are about 1 inch in diameter. She denies systemic symptoms, no fever, chills, no nausea or vomiting. No abdominal or urinary complaints.    Prior Medications Reviewed Using: List Brought by Patient  Updated Prior Medication List: ATENOLOL 100 MG TABS (ATENOLOL) Take 1 tablet by mouth once a day LISINOPRIL 20 MG TABS (LISINOPRIL) Take 1 tablet by mouth once a day HYDROCHLOROTHIAZIDE 25 MG TABS (HYDROCHLOROTHIAZIDE) Take 1 tablet by mouth once a day ADVAIR DISKUS 250-50 MCG/DOSE MISC (FLUTICASONE-SALMETEROL) One puff two times a day NOVOLOG MIX 70/30 70-30 % SUSP (INSULIN ASP PROT & ASP (HUM)) 120 units Subcutaneously every am NOVOLOG MIX 70/30 70-30 % SUSP (INSULIN ASP PROT & ASP (HUM)) 65 units subcutaneously every pm TYLENOL EXTRA STRENGTH 500 MG TABS (ACETAMINOPHEN) Take 1 tablet by mouth every 6 hours as needed for pain PRAVACHOL 20 MG   TABS (PRAVASTATIN SODIUM) Take 1 tab by mouth at bedtime PLAVIX 75 MG  TABS (CLOPIDOGREL BISULFATE) Take 1 tablet by mouth once a day LEXAPRO 10 MG  TABS (ESCITALOPRAM OXALATE) Take 1 tablet by mouth once a day  Current Allergies (reviewed today): ! MORPHINE SULFATE (MORPHINE SULFATE) ! * IODINATED CONTRAST  Past Medical History:    Reviewed history from 11/24/2008 and no changes required:       Current Problems:        RENAL FAILURE, ACUTE, HX OF (ICD-V13.09)       TOBACCO ABUSE (ICD-305.1)           COPD (ICD-496)       HYPERLIPIDEMIA NEC/NOS (ICD-272.4)           TRANSAMINASES, SERUM, ELEVATED (ICD-790.4)       Diabetes mellitus type II           DIABETIC PERIPHERAL NEUROPATHY (ICD-250.60)           DIABETIC FOOT ULCER, TOE (ICD-250.80)       HYPERTENSION, BENIGN ESSENTIAL (ICD-401.1)          Family History:    Reviewed history from 06/22/2008 and no changes required:       Mother  DM2, CHF, living       Father asthma, living       Brothers and  sisters asthma since childhood  Social History:    Reviewed history from 06/22/2008 and no changes required:       Lives with mom at home, currently unemployed   Risk Factors: Tobacco use:  current    Year started:  years    Cigarettes:  Yes -- 1.5 pack(s) per day Passive smoke exposure:  yes Drug use:  no HIV high-risk behavior:  no Alcohol use:  no Exercise:  no Seatbelt use:  100 %  Mammogram History:    Date of Last Mammogram:  04/11/2008  PAP Smear History:    Date of Last PAP Smear:  08/17/2001   Review of Systems  The patient denies fever, weight loss, weight gain, chest pain, peripheral edema, abdominal pain, and abnormal bleeding.     Physical Exam  Lungs:     normal respiratory effort, no intercostal retractions, no accessory muscle use, no crackles, and no wheezes.   Heart:     normal rate, regular rhythm, and no murmur.   Abdomen:     soft, non-tender, normal bowel sounds, no distention, and no  masses.   Genitalia:     2 boils, 1 inch in diameter, on the right side, lower vaginal area, hard and tender to palpation, no surrounding erythema, no pus, no ulceration; no other external lesions, no vaginal discharge  Diabetes Management Exam:    Foot Exam (with socks and/or shoes not present):       Sensory-Pinprick/Light touch:          Left medial foot (L-4): normal          Left dorsal foot (L-5): normal          Left lateral foot (S-1): normal          Right medial foot (L-4): normal          Right dorsal foot (L-5): normal          Right lateral foot (S-1): normal       Sensory-Monofilament:          Left foot: diminished          Right foot: diminished       Inspection:          Left foot: normal          Right foot: normal       Nails:          Left foot: thickened          Right foot: thickened     Impression & Recommendations:  Problem # 1:  BOILS, RECURRENT (ICD-680.9) This is recurrent problem for the patient. The boils are healing and no I & D is necessary today. Will start patient on antibiotic Doxycycline and will follow up in next 2-3 weeks. I have also advised patient to apply warm compresses and to monitor if the boils are healing properly. If the boils get worse and she experiences fever, chills and other systemic symptoms, she was advised to come back sooner.  Problem # 2:  DM W/MANIFESTATION NEC, TYPE II, UNCONTROLLED (ICD-250.82) HgbA1C is significantly higher that on the last visit. Pt was noncompliant with insulin regimen and today we have discusses further management. Pt was advised to monitor her sugar levels and to take insulin as perscribed and to come back after 1-2 weeks so that we can see where she is with her glucose control. We might have t include metformin in her regimen or even increase her insulin. No eye  exam is due today, feet were examined and no changes from previous examination noted.   Her updated medication list for this problem  includes:    Lisinopril 20 Mg Tabs (Lisinopril) .Marland Kitchen... Take 1 tablet by mouth once a day    Novolog Mix 70/30 70-30 % Susp (Insulin asp prot & asp (hum)) .Marland Kitchen... 120 units subcutaneously every am    Novolog Mix 70/30 70-30 % Susp (Insulin asp prot & asp (hum)) .Marland KitchenMarland KitchenMarland KitchenMarland Kitchen 65 units subcutaneously every pm  Orders: T-Hgb A1C (in-house) ([id number]) T- Capillary Blood Glucose ([id number])  Labs Reviewed: HgBA1c: 12.1 (01/29/2009)   Creat: 0.74 (11/24/2008)   Microalbumin: 0.20 (09/29/2007)  Last Eye Exam: No diabetic retinopathy.    (05/31/2008)   Problem # 3:  TOBACCO ABUSE (ICD-305.1) Encouraged smoking cessation and discussed different methods for smoking cessation. Pt reports cutting down but not completely quitting, will continue to encourage her to stop smoking.  Problem # 4:  HYPERTENSION, BENIGN ESSENTIAL (ICD-401.1) Good control on current regimen, will not make any changes today.  Her updated medication list for this problem includes:    Atenolol 100 Mg Tabs (Atenolol) .Marland Kitchen... Take 1 tablet by mouth once a day    Lisinopril 20 Mg Tabs (Lisinopril) .Marland Kitchen... Take 1 tablet by mouth once a day    Hydrochlorothiazide 25 Mg Tabs (Hydrochlorothiazide) .Marland Kitchen... Take 1 tablet by mouth once a day  Orders: T-Comprehensive Metabolic Panel ([id number])  BP today: 115/77 Prior BP: 98/72 (11/24/2008)  Labs Reviewed: Creat: 0.74 (11/24/2008) Chol: 171 (11/24/2008)   HDL: 30 (11/24/2008)   LDL: 79 (11/24/2008)   TG: 310 (11/24/2008)   Complete Medication List: 1)  Atenolol 100 Mg Tabs (Atenolol) .... Take 1 tablet by mouth once a day 2)  Lisinopril 20 Mg Tabs (Lisinopril) .... Take 1 tablet by mouth once a day 3)  Hydrochlorothiazide 25 Mg Tabs (Hydrochlorothiazide) .... Take 1 tablet by mouth once a day 4)  Advair Diskus 250-50 Mcg/dose Misc (Fluticasone-salmeterol) .... One puff two times a day 5)  Novolog Mix 70/30 70-30 % Susp (Insulin asp prot & asp (hum)) .Marland Kitchen.. 120 units subcutaneously every am 6)   Novolog Mix 70/30 70-30 % Susp (Insulin asp prot & asp (hum)) .... 65 units subcutaneously every pm 7)  Tylenol Extra Strength 500 Mg Tabs (Acetaminophen) .... Take 1 tablet by mouth every 6 hours as needed for pain 8)  Pravachol 20 Mg Tabs (Pravastatin sodium) .... Take 1 tab by mouth at bedtime 9)  Plavix 75 Mg Tabs (Clopidogrel bisulfate) .... Take 1 tablet by mouth once a day 10)  Lexapro 10 Mg Tabs (Escitalopram oxalate) .... Take 1 tablet by mouth once a day 11)  Doxycycline Hyclate 100 Mg Tabs (Doxycycline hyclate) .... Take 1 tablet by mouth two times a day for 14 days   Patient Instructions: 1)  Please schedule a follow-up appointment in 1 month. 2)  Check your blood sugars regularly. If your readings are usually above 300 : or below 70 you should contact our office. 3)  It is important that your Diabetic A1c level is checked every 3 months. 4)  See your eye doctor yearly to check for diabetic eye damage. 5)  Check your feet each night for sore areas, calluses or signs of infection. 6)  Check your Blood Pressure regularly. If it is above 170: you should make an appointment.   Prescriptions: DOXYCYCLINE HYCLATE 100 MG TABS (DOXYCYCLINE HYCLATE) Take 1 tablet by mouth two times a day for 14 days  #28 x  0   Entered and Authorized by:   Mliss Sax MD   Signed by:   Mliss Sax MD on 01/29/2009   Method used:   Print then Give to Patient   RxID:   [id number]   Laboratory Results   Blood Tests   Date/Time Received: January 29, 2009 10:36 AM  Date/Time Reported: Oren Beckmann  January 29, 2009 10:36 AM   HGBA1C: 12.1%   (Normal Range: Non-Diabetic - 3-6%   Control Diabetic - 6-8%) CBG Fasting:: 421mg /dL  Comments: Patient has not had insulin today results of CBG given to Dr. Aldine Contes at 10:354am 01-29-09 by Jeanann Lewandowsky  January 29, 2009 10:37 AM

## 2011-01-21 NOTE — Progress Notes (Signed)
Summary: Lyrica obstacle  Phone Note From Pharmacy   Caller: ME Batten, county pharmacist, [facility] program Call For: Dr. Aldine Contes  Summary of Call: We have a patient Jennifer [name]) that is requesting Lyrica.  We do not order that because it is a controlled substance and it is sent directly to the patient's house.  Do you have any suggestions on alternative meds to try for the patient?     Thanks a bunch.     ME     Craig Guess, PharmD, MHA  Director of Pharmacy  [facility]  [address]  [place], [place] [address]  [phone]  [phone]  Initial call taken by: Dorie Rank RN,  June 12, 2009 2:47 PM  Follow-up for Phone Call        I have attempted to call patient at home and will do so again tomorrow to see with her if she is willing to have that changed.  Thanks Verner Mould Follow-up by: Mliss Sax MD,  June 19, 2009 6:12 PM

## 2011-01-21 NOTE — Assessment & Plan Note (Signed)
Summary: CHECKUP/ SB.   Vital Signs:  Patient Profile:   50 Years Old Female Height:     66 inches (167.64 cm) Weight:      249.0 pounds (113.18 kg) BMI:     40.33 Temp:     98.1 degrees F (36.72 degrees C) oral Pulse rate:   68 / minute Pulse (ortho):   74 / minute BP sitting:   100 / 72  (right arm) BP standing:   98 / 72 Cuff size:   large  Pt. in pain?   no  Vitals Entered By: Krystal Eaton Duncan Dull) (November 24, 2008 2:37 PM)              Is Patient Diabetic? Yes Did you bring your meter with you today? Yes Nutritional Status BMI of > 30 = obese  Have you ever been in a relationship where you felt threatened, hurt or afraid?No   Does patient need assistance? Functional Status Self care Ambulation Impaired:Risk for fall Comments cane     Serial Vital Signs/Assessments:  Time      Position  BP       Pulse  Resp  Temp     By           Lying RA  109/76   [address] ([facility])           Standing  98/72    74                    Krystal Eaton ([facility])   PCP:  Mliss Sax MD  Chief Complaint:  "heartburn".  History of Present Illness: 72 old female with PMH of HTN, HLD, DM2, concerned for indigestion and heartburn worsening over past few weeks. Tried tums, baking soda  with no relief. Reports epigastric abdominal pain, aggrevated by food and when she lays down, gets better with sitting or standing, frequent coughing especially at night time. In addition she also reports difficulty with swallowing solids and liguids and subsequent pain with swallowing. This is usually associated with feeling of nausea but no vomiting. She mentions she has lost around 10 lbs as a result. No other abdominal complaints (no dia rrhea, no constipation, no vomiting).No fever or chills. No recent sickness or illness.      Prior Medications Reviewed Using: Patient Recall  Updated Prior Medication List: ATENOLOL 100 MG TABS (ATENOLOL) Take 1 tablet by mouth once a  day LISINOPRIL 20 MG TABS (LISINOPRIL) Take 1 tablet by mouth once a day HYDROCHLOROTHIAZIDE 25 MG TABS (HYDROCHLOROTHIAZIDE) Take 1 tablet by mouth once a day ADVAIR DISKUS 250-50 MCG/DOSE MISC (FLUTICASONE-SALMETEROL) One puff two times a day NOVOLOG MIX 70/30 70-30 % SUSP (INSULIN ASP PROT & ASP (HUM)) 120 units Subcutaneously every am NOVOLOG MIX 70/30 70-30 % SUSP (INSULIN ASP PROT & ASP (HUM)) 65 units subcutaneously every pm TYLENOL EXTRA STRENGTH 500 MG TABS (ACETAMINOPHEN) Take 1 tablet by mouth every 6 hours as needed for pain PRAVACHOL 20 MG  TABS (PRAVASTATIN SODIUM) Take 1 tab by mouth at bedtime PLAVIX 75 MG  TABS (CLOPIDOGREL BISULFATE) Take 1 tablet by mouth once a day LEXAPRO 10 MG  TABS (ESCITALOPRAM OXALATE) Take 1 tablet by mouth once a day AMITRIPTYLINE HCL 50 MG TABS (AMITRIPTYLINE HCL) Take one tablet twice daily  Current Allergies (reviewed today): ! MORPHINE SULFATE (MORPHINE SULFATE) ! * IODINATED  CONTRAST  Past Medical History:    Reviewed history from 04/02/2007 and no changes required:       Current Problems:        RENAL FAILURE, ACUTE, HX OF (ICD-V13.09)       TOBACCO ABUSE (ICD-305.1)           COPD (ICD-496)       HYPERLIPIDEMIA NEC/NOS (ICD-272.4)           TRANSAMINASES, SERUM, ELEVATED (ICD-790.4)       Diabetes mellitus type II           DIABETIC PERIPHERAL NEUROPATHY (ICD-250.60)           DIABETIC FOOT ULCER, TOE (ICD-250.80)       HYPERTENSION, BENIGN ESSENTIAL (ICD-401.1)          Family History:    Reviewed history from 06/22/2008 and no changes required:       Mother  DM2, CHF, living       Father asthma, living       Brothers and sisters asthma since childhood  Social History:    Reviewed history from 06/22/2008 and no changes required:       Lives with mom at home, currently unemployed   Risk Factors:  Tobacco use:  current    Year started:  years    Cigarettes:  Yes -- 1.5 pack(s) per day    Counseled to quit/cut down  tobacco use:  yes Passive smoke exposure:  yes Drug use:  no HIV high-risk behavior:  no Alcohol use:  no Exercise:  no Seatbelt use:  100 %  Mammogram History:    Date of Last Mammogram:  04/11/2008  PAP Smear History:    Date of Last PAP Smear:  08/17/2001   Review of Systems       The patient complains of weight loss, hoarseness, and abdominal pain.  The patient denies fever, chest pain, dyspnea on exertion, peripheral edema, headaches, and hemoptysis.         See HPI for abdominal pain, weight loss, and hoarseness.   Physical Exam  General:     alert and well-developed.   Mouth:     pharynx pink and moist, no erythema, no exudates, and edentulous.   Neck:     supple and full ROM.   Lungs:     normal respiratory effort, no intercostal retractions, no accessory muscle use, no crackles, and no wheezes.   Heart:     normal rate, regular rhythm, no murmur, no gallop, and no JVD.   Abdomen:     soft, normal bowel sounds, no distention, no guarding, no rigidity, and epigastric tenderness.   Rectal:     no external abnormalities, no hemorrhoids, normal sphincter tone, and no tenderness.  Stool negative for occult blood Extremities:     trace left pedal edema.   Neurologic:     alert & oriented X3, cranial nerves II-XII intact, strength normal in all extremities, and decreased sensation to LT.   Skin:     turgor normal, color normal, and no rashes.   Psych:     Oriented X3 and memory intact for recent and remote.      Impression & Recommendations:  Problem # 1:  GASTROESOPHAGEAL REFLUX DISEASE (ICD-530.81) Pt's symptoms are possibly due to acid reflux. However, documented 10 lbs weight loss is rather unusual with GERD and it is more worisome for cancer given her long history of smoking and unintentional weight loss. In addition there  might be a component of peptic ulcer disease contributing even though I would expect that food relieves the symptoms and in her case food  seems to aggrevate her condition. We will schedule an appointment with GI specialist since pt will need endoscopy soon. We will also start her on PPI and see if her symptoms improve. She was told that even though her symptoms improve she still needs to see GI doctor since her weight loss is more concerning. I have also discussed lifestyle modifications, diet, antacids/medications, and preventive measures. Handout provided.   Her updated medication list for this problem includes:    Famotidine 20 Mg Tabs (Famotidine) .Marland Kitchen... Take 1 tablet by mouth two times a day   Problem # 2:  DYSPHAGIA UNSPECIFIED (ICD-787.20) Combined with odynophagia, to solids and liquids. Differential includes achalasia, esophageal cancer, hiatal hernia combined with GERD. Pt will need endoscopy for further evaluation. Will check c-met since gallbladder pathology possible, as well as pancreatic etiology (will check lipase). Pt was told to come back to clinic as soon as she has endoscopy done. I will call Gi doctor to see if she has made it to the appointment.   Orders: T-Comprehensive Metabolic Panel [id number]) T-Lipase ([id number]) T-CBC No Diff ([id number])   Problem # 3:  HYPERTENSION, BENIGN ESSENTIAL (ICD-401.1) BP slightly on hypotensive side. Otherwise within the target range. We will check orthostatics today. Patient was advised to check her BP regularly and to write it down so that we can review it on her next visit. If it continues to stay on low side we might need to decrease the dose on one of the current medicines. Will not make any changes today.   Her updated medication list for this problem includes:    Atenolol 100 Mg Tabs (Atenolol) .Marland Kitchen... Take 1 tablet by mouth once a day    Lisinopril 20 Mg Tabs (Lisinopril) .Marland Kitchen... Take 1 tablet by mouth once a day    Hydrochlorothiazide 25 Mg Tabs (Hydrochlorothiazide) .Marland Kitchen... Take 1 tablet by mouth once a day  BP today: 100/72 Prior BP: 117/76 (11/08/2008)  Labs  Reviewed: Creat: 0.67 (11/08/2008)  Addendum: I have discussed the case with Dr. Landis Martins and we have agreed to remove HCTZ tamporarily and we will call patient to come back in 2 weeks so that we can recheck BP. If it is WNL we will continue the regimen without HCTZ and if the BP goes up we will restart low dose HCTZ.    Problem # 4:  DM W/MANIFESTATION NEC, TYPE II, UNCONTROLLED (ICD-250.82) Pt HgBA1C is above the target range. She reports skipping doses on occasions (if she falls asleep or if she does not eat). She has met with [name] and was educated about the proper diet as well as importance of strict adherence to insulin treatment. I have discussed with her today in details why she needs to take insulin as perscribed and I have also explained to her risks of not taking the insulin including leg amputation, blindness, and hemodyalisis. I hope she follows the advice this time. She will be due for HgBA1C on next appointment,   Her updated medication list for this problem includes:    Novolog Mix 70/30 70-30 % Susp (Insulin asp prot & asp (hum)) .Marland Kitchen... 120 units subcutaneously every am    Novolog Mix 70/30 70-30 % Susp (Insulin asp prot & asp (hum)) .Marland KitchenMarland KitchenMarland KitchenMarland Kitchen 65 units subcutaneously every pm  Labs Reviewed: HgBA1c: 9.4 (09/26/2008)   Creat: 0.67 (11/08/2008)   Microalbumin: 0.20 (  09/29/2007)  Last Eye Exam: No diabetic retinopathy.    (05/31/2008)  Her updated medication list for this problem includes:    Lisinopril 20 Mg Tabs (Lisinopril) .Marland Kitchen... Take 1 tablet by mouth once a day    Novolog Mix 70/30 70-30 % Susp (Insulin asp prot & asp (hum)) .Marland Kitchen... 120 units subcutaneously every am    Novolog Mix 70/30 70-30 % Susp (Insulin asp prot & asp (hum)) .Marland KitchenMarland KitchenMarland KitchenMarland Kitchen 65 units subcutaneously every pm   Problem # 5:  HYPERLIPIDEMIA NEC/NOS (ICD-272.4) Will repeat FLP today and will adjust the regimen accordingly.   Her updated medication list for this problem includes:    Pravachol 20 Mg Tabs (Pravastatin  sodium) .Marland Kitchen... Take 1 tab by mouth at bedtime  Orders: T-Lipid Profile ([id number])   Problem # 6:  TOBACCO ABUSE (ICD-305.1) Encouraged smoking cessation and discussed different methods for smoking cessation. Has been done numerous times in the past with little success. Will continue to provide education and encouragement to quit.   Complete Medication List: 1)  Atenolol 100 Mg Tabs (Atenolol) .... Take 1 tablet by mouth once a day 2)  Lisinopril 20 Mg Tabs (Lisinopril) .... Take 1 tablet by mouth once a day 3)  Hydrochlorothiazide 25 Mg Tabs (Hydrochlorothiazide) .... Take 1 tablet by mouth once a day 4)  Advair Diskus 250-50 Mcg/dose Misc (Fluticasone-salmeterol) .... One puff two times a day 5)  Novolog Mix 70/30 70-30 % Susp (Insulin asp prot & asp (hum)) .Marland Kitchen.. 120 units subcutaneously every am 6)  Novolog Mix 70/30 70-30 % Susp (Insulin asp prot & asp (hum)) .... 65 units subcutaneously every pm 7)  Tylenol Extra Strength 500 Mg Tabs (Acetaminophen) .... Take 1 tablet by mouth every 6 hours as needed for pain 8)  Pravachol 20 Mg Tabs (Pravastatin sodium) .... Take 1 tab by mouth at bedtime 9)  Plavix 75 Mg Tabs (Clopidogrel bisulfate) .... Take 1 tablet by mouth once a day 10)  Lexapro 10 Mg Tabs (Escitalopram oxalate) .... Take 1 tablet by mouth once a day 11)  Amitriptyline Hcl 50 Mg Tabs (Amitriptyline hcl) .... Take one tablet twice daily 12)  Famotidine 20 Mg Tabs (Famotidine) .... Take 1 tablet by mouth two times a day  Other Orders: Hemoccult Guaiac-1 spec.(in office) ([id number]) Gastroenterology Referral (GI) 12 Lead EKG (12 Lead EKG)   Patient Instructions: 1)  Please schedule a follow-up appointment as soon as you have endoscopy done. 2)  Check your feet each night for sore areas, calluses or signs of infection. 3)  Check your Blood Pressure regularly. If it is above 180: you should make an appointment. 4)  Check your blood sugars regularly. If your readings are usually  above 300 : or below 70 you should contact our office. 5)  Please make sure ou don't miss your appointment next week for endoscopy.    Prescriptions: FAMOTIDINE 20 MG TABS (FAMOTIDINE) Take 1 tablet by mouth two times a day  #60 x 1   Entered and Authorized by:   Mliss Sax MD   Signed by:   Mliss Sax MD on 12/01/2008   Method used:   Print then Give to Patient   RxID:   [id number]  ]

## 2011-01-21 NOTE — Consult Note (Signed)
Summary: [facility]: Dr. Posey [facility]: Dr. Dione Booze   Imported By: Florinda Marker 10/29/2007 13:38:18  _____________________________________________________________________  External Attachment:    Type:   Image     Comment:   External Document

## 2011-01-21 NOTE — Letter (Signed)
Summary: [facility]: Med. ID  cordis: Med. ID   Imported By: Florinda Marker 11/15/2007 13:50:53  _____________________________________________________________________  External Attachment:    Type:   Image     Comment:   External Document

## 2011-01-21 NOTE — Miscellaneous (Signed)
Summary: Phone Consultation  Phone consultation with patient re: Disability.    This is a patient with multiple medical problems including diabetes with complications, arthritis, COPD. The patient had some suicidal ideation back in 2006 but she never pursued mental health treatment.  No other history of MH services.   She had applied for disability back in 06 but never filed for a reconsideration because she had to get back to work.  I did explain to the patient that disability is not a quick process.  But she's been out of work for three weeks now and has had two hospitalizations--one in Sept. 2008 for a week and another two weeks ago for one day.   I will refer the case to the [facility]/Disability Case Manager for disability assessment and they will help her file if appropriate. The patient has been advised to attempt Medicaid application.   The patient is apparently living with her parents on [address] here in [place].   She is receptive to a referral for disability.  It's unclear if she will go down to [facility]. to apply for Medicaid as she said she has no money right now.   So she did not commit to getting over to DSS.  SW will follow with [facility] and facilitate a referral there.    Appended Document: Disability Referral Faxed to [facility] Referral form completed and faxed to Therese Sarah at [facility] to assist pt with disability claim.

## 2011-01-21 NOTE — Progress Notes (Signed)
Summary: Patient Assistance  Phone Note Refill Request        Prescriptions: NOVOLOG MIX 70/30 70-30 % SUSP (INSULIN ASP PROT & ASP (HUM)) 120 units Subcutaneously every am  #3 month supp x 6   Entered and Authorized by:   Ulyess Mort MD   Signed by:   Ulyess Mort MD on 08/31/2008   Method used:   Samples Given   RxID:   [id number]   Patient Assist Medication Verification: Medication: Novolog 70/66mix [id number] Exp Date:10/11 Tech approval:NLS               Prescription/Samples picked up by: patient Concepcion Elk   BA.,CPht II  October 11, 2008 9:53 AM

## 2011-01-21 NOTE — Medication Information (Signed)
Summary: Tax adviser   Imported By: Florinda Marker 09/28/2009 14:26:18  _____________________________________________________________________  External Attachment:    Type:   Image     Comment:   External Document

## 2011-01-21 NOTE — Assessment & Plan Note (Signed)
Summary: LEG PAIN/MAGICK/VS   Vital Signs:  Patient profile:   50 year old female Height:      66 inches (167.64 cm) Weight:      245.0 pounds (111.36 kg) BMI:     39.69 Temp:     98.7 degrees F (37.06 degrees C) oral Pulse rate:   86 / minute BP sitting:   152 / 90  (right arm)  Vitals Entered By: Krystal Eaton Duncan Dull) (May 28, 2009 2:31 PM) CC: Depression Is Patient Diabetic? Yes  Pain Assessment Patient in pain? yes     Location: bilateral leg Intensity: 10 Nutritional Status BMI of > 30 = obese CBG Result 204  Have you ever been in a relationship where you felt threatened, hurt or afraid?No   Does patient need assistance? Functional Status Self care Ambulation Impaired:Risk for fall Comments uses a cane   Primary Care Provider:  Mliss Sax MD  CC:  Depression.  History of Present Illness: Pt is a 50yo AAF with pmhx outlined below who presents today with complaint of depression and bilateal leg pain.   Pt used to be on Lexapro for depression, but hasn't been on it since 3/10 because she lost her Medicaid.  She has recently been restarted on Paxil because it is available at the health department,  but hasn't filled the script yet.  She does relate depression, but states that she is not suicidal at this time when asked directly.  She does state her multiple medical problems are the root of her depressed mood.     Regarding her leg pain, she was recently restarted back on Lyrica by Dr. Reynold Bowen last week, but hasn't filled the script yet.  She has a pending appointment to meet with the vascular surgeon.    Depression History:      The patient is having a depressed mood most of the day and has a diminished interest in her usual daily activities.        Suicide risk questions reveal that she wishes that she were dead, she has thought about ending her life, and she has even planned how to end her life.  The patient denies that she feels like life is not worth living.     Comments:  No depression meds "for months".   Preventive Screening-Counseling & Management  Alcohol-Tobacco     Alcohol drinks/day: 0     Smoking Status: current     Smoking Cessation Counseling: yes     Packs/Day: 1.5     Year Started: years     Passive Smoke Exposure: yes  Caffeine-Diet-Exercise     Does Patient Exercise: no  Allergies: 1)  ! Morphine Sulfate (Morphine Sulfate) 2)  ! * Iodinated Contrast  Review of Systems       See HPI  Physical Exam  General:  overweight-appearing.   Eyes:  vision grossly intact.   Lungs:  Normal respiratory effort, chest expands symmetrically. Lungs are clear to auscultation, no crackles or wheezes. Heart:  Normal rate and regular rhythm. S1 and S2 normal without gallop, murmur, click, rub or other extra sounds. Abdomen:  soft and non-tender.   Msk:  Pt with mild TTP on bilat LE with palpation.   Extremities:  No edema Neurologic:  Nonfocal Skin:  No lesions Psych:  depressed affect.  Pt denies SI when asked directly.  Denies HI.   Impression & Recommendations:  Problem # 1:  INADEQUATE MATERIAL RESOURCES (ICD-V60.2) Pt is current living with Daughter-in-law  and at risk for losing a place to live.  I will schedule a social work referral to assist patient with this matter.  Orders: Social Work Referral (Social )  Problem # 2:  DM W/MANIFESTATION NEC, TYPE II, UNCONTROLLED (ICD-250.82) Poorly controlled.  Pt does not check her sugars regularly and did not bring her meter in today.  She readily admits to skipping doses of both her morning and evening insulin.  No plans to alter her 70/30 regimen until she can prove that she actually takes the prescribed doses.  Pt has seen [name] before, and states she has no interest in meeting with her again.  She denies any hypoglycemia episodes.     Her updated medication list for this problem includes:    Lisinopril 20 Mg Tabs (Lisinopril) .Marland Kitchen... Take 1 tablet by mouth once a day     Novolog Mix 70/30 70-30 % Susp (Insulin asp prot & asp (hum)) .Marland Kitchen... 120 units subcutaneously every am    Novolog Mix 70/30 70-30 % Susp (Insulin asp prot & asp (hum)) .Marland KitchenMarland KitchenMarland KitchenMarland Kitchen 65 units subcutaneously every pm  Orders: T-Hgb A1C (in-house) ([address]109UE) T- Capillary Blood Glucose (45409)  Labs Reviewed: Creat: 0.84 (01/29/2009)     Last Eye Exam: No diabetic retinopathy.    (05/31/2008) Reviewed HgBA1c results: 11.8 (05/28/2009)  12.1 (01/29/2009)  Problem # 3:  DIABETIC PERIPHERAL NEUROPATHY (ICD-250.60) Pt plans to pick up her new script of Lyrica today.  We have d/c'd the amitriptyline at this time.  Will follow-up with the efficacy of the Lyrica and titrate up as needed.    Her updated medication list for this problem includes:    Lisinopril 20 Mg Tabs (Lisinopril) .Marland Kitchen... Take 1 tablet by mouth once a day    Novolog Mix 70/30 70-30 % Susp (Insulin asp prot & asp (hum)) .Marland Kitchen... 120 units subcutaneously every am    Novolog Mix 70/30 70-30 % Susp (Insulin asp prot & asp (hum)) .Marland KitchenMarland KitchenMarland KitchenMarland Kitchen 65 units subcutaneously every pm  Problem # 4:  DEPRESSION (ICD-311) Pt denies SI/HI, but states she is depressed.  She has not been taking an SSRI since March '10.  She was given a script for Paxil on Friday afternoon, and she plans to go by the [facility] today in order to pick up the script.  Pt advised to return to clinic or ED if she develops any thoughts of self-harm.    Her updated medication list for this problem includes:    Paxil 20 Mg Tabs (Paroxetine hcl) .Marland Kitchen... Take 1 tablet by mouth once a day  Problem # 5:  TOBACCO ABUSE (ICD-305.1) Pt continues to smoke.  No desire to quit at this time.    Complete Medication List: 1)  Atenolol 100 Mg Tabs (Atenolol) .... Take 1 tablet by mouth once a day 2)  Lisinopril 20 Mg Tabs (Lisinopril) .... Take 1 tablet by mouth once a day 3)  Hydrochlorothiazide 25 Mg Tabs (Hydrochlorothiazide) .... Take 1 tablet by mouth once a day 4)  Advair Diskus 250-50  Mcg/dose Misc (Fluticasone-salmeterol) .... One puff two times a day 5)  Novolog Mix 70/30 70-30 % Susp (Insulin asp prot & asp (hum)) .Marland Kitchen.. 120 units subcutaneously every am 6)  Novolog Mix 70/30 70-30 % Susp (Insulin asp prot & asp (hum)) .... 65 units subcutaneously every pm 7)  Tylenol Extra Strength 500 Mg Tabs (Acetaminophen) .... Take 1 tablet by mouth every 6 hours as needed for pain 8)  Pravachol 20 Mg Tabs (Pravastatin sodium) .Marland KitchenMarland KitchenMarland Kitchen  Take 1 tab by mouth at bedtime 9)  Plavix 75 Mg Tabs (Clopidogrel bisulfate) .... Take 1 tablet by mouth once a day 10)  Paxil 20 Mg Tabs (Paroxetine hcl) .... Take 1 tablet by mouth once a day 11)  Ra Pen Needles 31g X 8 Mm Misc (Insulin pen needle) .... Use 3 times per day to check the sugar level 12)  Lyrica 50 Mg Caps (Pregabalin) .... Take 1 capsule by mouth three times a day  Patient Instructions: 1)  Please schedule a follow-up appointment in 3 months. Prescriptions: PRAVACHOL 20 MG  TABS (PRAVASTATIN SODIUM) Take 1 tab by mouth at bedtime  #30 x 3   Entered and Authorized by:   Genia Del MD   Signed by:   Genia Del MD on 05/28/2009   Method used:   Print then Give to Patient   RxID:   [id number]   Laboratory Results   Blood Tests   Date/Time Received: May 28, 2009 2:46 PM. Date/Time Reported: Alric Quan  May 28, 2009 2:46 PM  HGBA1C: 11.8%   (Normal Range: Non-Diabetic - 3-6%   Control Diabetic - 6-8%) CBG Random:: 204mg /dL

## 2011-01-21 NOTE — Progress Notes (Signed)
Summary: Refill/gh  Phone Note Refill Request Message from:  Fax from Pharmacy on August 23, 2010 2:49 PM  Refills Requested: Medication #1:  Trazodone 100 mg tablets # 15 Take  1/2 tablet at bedtime to fall asleep.   Last Refilled: 06/20/2010  Method Requested: Fax to Local Pharmacy Initial call taken by: Angelina Ok RN,  August 23, 2010 2:54 PM  Follow-up for Phone Call        completed refill, thank you Tish Begin  Follow-up by: Mliss Sax MD,  August 26, 2010 9:51 PM    New/Updated Medications: TRAZODONE HCL 100 MG TABS (TRAZODONE HCL) take 1/2 tablet at bedtime as needed for insomnia Prescriptions: TRAZODONE HCL 100 MG TABS (TRAZODONE HCL) take 1/2 tablet at bedtime as needed for insomnia  #15 x 0   Entered and Authorized by:   Mliss Sax MD   Signed by:   Mliss Sax MD on 08/26/2010   Method used:   Faxed to ...       [facility] Department (retail)       [address] [place], [place]  [address]       Ph: [phone]       Fax: [phone]   RxID:   [id number]

## 2011-01-21 NOTE — Progress Notes (Signed)
Summary: Nuclear Pre-Procedure  Phone Note Outgoing Call Call back at [facility] Phone [phone]   Call placed by: Stanton Kidney, EMT-P,  December 06, 2009 9:33 AM Action Taken: Phone Call Completed Summary of Call: Left message with information on [employer] Information Sheet (see scanned document for details).     Nuclear Med Background Indications for Stress Test: Evaluation for Ischemia   History: COPD, Echo  History Comments: '07 Echo: EF=55-60%  Symptoms: Chest Pain, DOE    Nuclear Pre-Procedure Cardiac Risk Factors: Family History - CAD, Hypertension, IDDM Type 2, Lipids, PVD, Smoker Height (in): 66

## 2011-01-21 NOTE — Progress Notes (Signed)
Summary: Medication change  Phone Note Refill Request Message from:  Patient on March 28, 2009 4:14 PM  Refills Requested: Medication #1:  LEXAPRO 10 MG  TABS Take 1 tablet by mouth once a day [facility] does not carry this.  Pt would like something else.   Method Requested: Fax to Local Pharmacy Initial call taken by: Angelina Ok RN,  March 28, 2009 4:15 PM  Follow-up for Phone Call        Send this to Dr. Aldine Contes Follow-up by: Ulyess Mort MD,  March 28, 2009 4:29 PM  Additional Follow-up for Phone Call Additional follow up Details #1::        She can try celexa 10 mg daily and I have put in the perscription. Thank you    New/Updated Medications: CELEXA 10 MG TABS (CITALOPRAM HYDROBROMIDE) Take 1 tablet by mouth once a day   Prescriptions: CELEXA 10 MG TABS (CITALOPRAM HYDROBROMIDE) Take 1 tablet by mouth once a day  #30 x 3   Entered by:   Mliss Sax MD   Authorized by:   Marland Kitchen [facility] ATTENDING DESKTOP   Signed by:   Mliss Sax MD on 03/28/2009   Method used:   Telephoned to ...       [facility] Department (retail)       [address] [place], [place]  [address]       Ph: [phone]       Fax: [phone]   RxID:   [id number]   Appended Document: Medication change   Impression & Recommendations:  Problem # 1:  DEPRESSION (ICD-311) I have called the pharmacy and the pharmacy only had zoloft and paxil available, I wil start patient on Paxil for now but as soon as the pharmacy gets lexapro I would like the patient to go back to her original med, however the problem is that it will take 6-8 weeks before they can get the medicine.   Her updated medication list for this problem includes:    Paxil 20 Mg Tabs (Paroxetine hcl) .Marland Kitchen... Take 1 tablet by mouth once a day

## 2011-01-21 NOTE — Assessment & Plan Note (Signed)
Summary: medication adjustment/gg   Vital Signs:  Patient Profile:   50 Years Old Female Height:     66 inches (167.64 cm) Weight:      257.7 pounds (117.14 kg) BMI:     41.74 Temp:     98.0 degrees F (36.67 degrees C) oral Pulse rate:   73 / minute BP sitting:   117 / 76  (right arm)  Pt. in pain?   yes    Location:   BLE's    Intensity:   10    Type:       aching  Vitals Entered By: Chinita Pester RN (November 08, 2008 1:49 PM)              Is Patient Diabetic? Yes Did you bring your meter with you today? No Nutritional Status BMI of > 30 = obese CBG Result 197  Have you ever been in a relationship where you felt threatened, hurt or afraid?No   Does patient need assistance? Functional Status Self care Ambulation Impaired:Risk for fall Comments using a cane     PCP:  Mliss Sax MD  Chief Complaint:  Change in medication - Lyrica- to something else.  History of Present Illness: 50 yo female with PMH of recurrent generalized body abscesses, hypertension, hyperlipidemia, and DM2 came in for no relief in pain with Lyrica.  Her pain is generalised in hands and legs. She grades them 8/10 and it prevents her from sleeping well. She used to be on Neurontin 800 mg three times a day. She felt that she was prescribed 300 mg lyrica three times a day. Actually, she had prescription for 100 mg three times a day.   She did not have any pain relief with total of 900 mg lyrica everyday for three days (9 tabs a day). So she increased her dose to 400 mg four times a day (total of 16 tablets)!!!  She still had no pain relief so she called. Dr. Aldine Contes responded to her request of higher dose Lyrica by asking her to be seen.   She has no other complain today and would only like to talk about her diabetic neuropathy pain control.    Prior Medications Reviewed Using: Patient Recall  Prior Medication List:  ATENOLOL 100 MG TABS (ATENOLOL) Take 1 tablet by mouth once a day LISINOPRIL  20 MG TABS (LISINOPRIL) Take 1 tablet by mouth once a day HYDROCHLOROTHIAZIDE 25 MG TABS (HYDROCHLOROTHIAZIDE) Take 1 tablet by mouth once a day LYRICA 100 MG CAPS (PREGABALIN) Take 1 tablet by mouth three times a day ADVAIR DISKUS 250-50 MCG/DOSE MISC (FLUTICASONE-SALMETEROL) One puff two times a day NOVOLOG MIX 70/30 70-30 % SUSP (INSULIN ASP PROT & ASP (HUM)) 120 units Subcutaneously every am NOVOLOG MIX 70/30 70-30 % SUSP (INSULIN ASP PROT & ASP (HUM)) 65 units subcutaneously every pm TYLENOL EXTRA STRENGTH 500 MG TABS (ACETAMINOPHEN) Take 1 tablet by mouth every 6 hours as needed for pain PRAVACHOL 20 MG  TABS (PRAVASTATIN SODIUM) Take 1 tab by mouth at bedtime PLAVIX 75 MG  TABS (CLOPIDOGREL BISULFATE) Take 1 tablet by mouth once a day LEXAPRO 10 MG  TABS (ESCITALOPRAM OXALATE) Take 1 tablet by mouth once a day   Current Allergies: ! MORPHINE SULFATE (MORPHINE SULFATE) ! * IODINATED CONTRAST  Past Medical History:    Reviewed history from 04/02/2007 and no changes required:       Current Problems:        ABSCESS, PERIRECTAL, HX OF (ICD-V13.3)  RENAL FAILURE, ACUTE, HX OF (ICD-V13.09)       TOBACCO ABUSE (ICD-305.1)           COPD (ICD-496)       HYPERLIPIDEMIA NEC/NOS (ICD-272.4)           TRANSAMINASES, SERUM, ELEVATED (ICD-790.4)       Diabetes mellitus type II           DIABETIC PERIPHERAL NEUROPATHY (ICD-250.60)           DIABETIC FOOT ULCER, TOE (ICD-250.80)       HYPERTENSION, BENIGN ESSENTIAL (ICD-401.1)         Past Surgical History:    Reviewed history from 01/08/2007 and no changes required:       PTCA/stent, left iliac 2005       R shoulder arthroscropy/debridement       Hysterectomy, 1997 due to fibroids          Family History:    Reviewed history from 06/22/2008 and no changes required:       Mother  DM2, CHF, living       Father asthma, living       Brothers and sisters asthma since childhood  Social History:    Reviewed history from 06/22/2008  and no changes required:       Lives with mom at home, currently unemployed   Risk Factors:  Tobacco use:  current    Year started:  years    Cigarettes:  Yes -- 1 pack(s) per day Passive smoke exposure:  yes Drug use:  no HIV high-risk behavior:  no Alcohol use:  no Exercise:  no Seatbelt use:  100 %  Mammogram History:    Date of Last Mammogram:  04/11/2008  PAP Smear History:    Date of Last PAP Smear:  08/17/2001   Review of Systems      See HPI   Physical Exam  General:     alert, well-developed, well-nourished, and well-hydrated.   Head:     normocephalic, no abnormalities observed, and no abnormalities palpated.   Eyes:     vision grossly intact, pupils equal, pupils round, pupils reactive to light, and no nystagmus.   Ears:     no external deformities.   Nose:     no external erythema.   Mouth:     pharynx pink and moist.   Neck:     supple, full ROM, and no masses.   Lungs:     normal respiratory effort, no intercostal retractions, no accessory muscle use, no crackles, and no wheezes.   Heart:     normal rate, regular rhythm, no murmur, no rub, no JVD, and no HJR.   Abdomen:     soft, non-tender, normal bowel sounds, no distention, no masses, no guarding, no rigidity, no hepatomegaly, and no splenomegaly.   Neurologic:     alert & oriented X3, cranial nerves II-XII intact, strength normal in all extremities, and DTRs symmetrical and normal.   Psych:     Oriented X3, memory intact for recent and remote, normally interactive, and good eye contact.      Impression & Recommendations:  Problem # 1:  DIABETIC PERIPHERAL NEUROPATHY (ICD-250.60) Assessment: Unchanged As described in HPI patient has severe pain likely from diabetic neuropathy and has not benifited with lyrica. She self medicated her with very high dose of Lyrica. Case was discussed with Dr. Lurene Shadow. Literature review showed that lyrica doses as high as 8000mg /day has had no additional  side effects compared to advised dose of lyrica. Max dose advised is 450mg /day.  I advised patient to ONLY take lyrica as advised. Patient had better response with Neurontin 800 mg/day. I discussed two options with patient- one to go back on Neurontin. Or second, to try Amytryptiline. In clinical trial, Amytryptiline average dose was 107 mg/day to get adequete pain relief. Patient wanted to try Amytryptiline. Side effects including dry month were discussed. Patient was prescribed 50 mg twice daily (total dose of 100 mg) for better 24 hr coverage. Dose can be increased to 100 mg two times a day if needed.  Lyrica also is anticonvulsant and sotpping it abruptly is not advisable, especially when patient was mistakenly taking high dose. Therefore, I advised patient to wean herself off of Lyrica over period of 9 days (3 tabs X 3 days, 2 tabs X 3 days, 1 tab X 3 days, stop, throw away the bottle).   Her updated medication list for this problem includes:    Lisinopril 20 Mg Tabs (Lisinopril) .Marland Kitchen... Take 1 tablet by mouth once a day    Novolog Mix 70/30 70-30 % Susp (Insulin asp prot & asp (hum)) .Marland Kitchen... 120 units subcutaneously every am    Novolog Mix 70/30 70-30 % Susp (Insulin asp prot & asp (hum)) .Marland KitchenMarland KitchenMarland KitchenMarland Kitchen 65 units subcutaneously every pm   Problem # 2:  DEPRESSION (ICD-311) Unchanged, amytryptiline should help.  Her updated medication list for this problem includes:    Lexapro 10 Mg Tabs (Escitalopram oxalate) .Marland Kitchen... Take 1 tablet by mouth once a day    Amitriptyline Hcl 50 Mg Tabs (Amitriptyline hcl) .Marland Kitchen... Take one tablet twice daily   Problem # 3:  BACK PAIN (ICD-724.5) Unchanged.  Her updated medication list for this problem includes:    Tylenol Extra Strength 500 Mg Tabs (Acetaminophen) .Marland Kitchen... Take 1 tablet by mouth every 6 hours as needed for pain   Problem # 4:  HYPERTENSION, BENIGN ESSENTIAL (ICD-401.1) BP at goal. No change made.  Her updated medication list for this problem includes:     Atenolol 100 Mg Tabs (Atenolol) .Marland Kitchen... Take 1 tablet by mouth once a day    Lisinopril 20 Mg Tabs (Lisinopril) .Marland Kitchen... Take 1 tablet by mouth once a day    Hydrochlorothiazide 25 Mg Tabs (Hydrochlorothiazide) .Marland Kitchen... Take 1 tablet by mouth once a day  BP today: 117/76 Prior BP: 130/80 (09/26/2008)  Labs Reviewed: Creat: 0.71 (09/26/2008) Chol: 183 (06/22/2008)   HDL: 31 (06/22/2008)   LDL: 122 (06/22/2008)   TG: 150 (06/22/2008)   Problem # 5:  DM W/MANIFESTATION NEC, TYPE II, UNCONTROLLED (ICD-250.82) DM poor control. At present no change made as patient has not brought glucometer and had previous observations of glucose  ~70s. Dr. Aldine Contes will address this issue with help of Nutritionist on future visits.   Her updated medication list for this problem includes:    Lisinopril 20 Mg Tabs (Lisinopril) .Marland Kitchen... Take 1 tablet by mouth once a day    Novolog Mix 70/30 70-30 % Susp (Insulin asp prot & asp (hum)) .Marland Kitchen... 120 units subcutaneously every am    Novolog Mix 70/30 70-30 % Susp (Insulin asp prot & asp (hum)) .Marland KitchenMarland KitchenMarland KitchenMarland Kitchen 65 units subcutaneously every pm  Labs Reviewed: HgBA1c: 9.4 (09/26/2008)   Creat: 0.71 (09/26/2008)   Microalbumin: 0.20 (09/29/2007)  Last Eye Exam: No diabetic retinopathy.    (05/31/2008)   Complete Medication List: 1)  Atenolol 100 Mg Tabs (Atenolol) .... Take 1 tablet by mouth once a day 2)  Lisinopril 20 Mg Tabs (Lisinopril) .... Take 1 tablet by mouth once a day 3)  Hydrochlorothiazide 25 Mg Tabs (Hydrochlorothiazide) .... Take 1 tablet by mouth once a day 4)  Lyrica 100 Mg Caps (Pregabalin) .... Take 1 tablet by mouth three times a day 5)  Advair Diskus 250-50 Mcg/dose Misc (Fluticasone-salmeterol) .... One puff two times a day 6)  Novolog Mix 70/30 70-30 % Susp (Insulin asp prot & asp (hum)) .Marland Kitchen.. 120 units subcutaneously every am 7)  Novolog Mix 70/30 70-30 % Susp (Insulin asp prot & asp (hum)) .... 65 units subcutaneously every pm 8)  Tylenol Extra Strength 500 Mg Tabs  (Acetaminophen) .... Take 1 tablet by mouth every 6 hours as needed for pain 9)  Pravachol 20 Mg Tabs (Pravastatin sodium) .... Take 1 tab by mouth at bedtime 10)  Plavix 75 Mg Tabs (Clopidogrel bisulfate) .... Take 1 tablet by mouth once a day 11)  Lexapro 10 Mg Tabs (Escitalopram oxalate) .... Take 1 tablet by mouth once a day 12)  Amitriptyline Hcl 50 Mg Tabs (Amitriptyline hcl) .... Take one tablet twice daily  Other Orders: Capillary Blood Glucose (16109) Fingerstick (60454) T-Basic Metabolic Panel (09811-91478) T-CBC No Diff (29562-13086)   Patient Instructions: 1)  Take Lyrica 100 mg tablet, 1 tablet for three times for first 3 days- stop on 11/11/08. 2)  Take Lyrica 100 mg tablet, 1 tablet for two times a day from 11/12/08 to 11/14/08. 3)  Take Lyrica 100 mg tablet, 1 tablet for one time a day from 11/15/08 to 11/17/08. 4)  Start taking Amytryptiline 50 mg tablet, one tablet twice daily from tomorrow. 5)  Continue other medications as prescribed.  6)  Make an appointment to see Nutritionist for Diabetes.  7)  Do not forget to bring your meter next visit.    Prescriptions: AMITRIPTYLINE HCL 50 MG TABS (AMITRIPTYLINE HCL) Take one tablet twice daily  #60 x 3   Entered and Authorized by:   Clerance Lav MD   Signed by:   Clerance Lav MD on 11/08/2008   Method used:   Print then Give to Patient   RxID:   [id number] AMITRIPTYLINE HCL 50 MG TABS (AMITRIPTYLINE HCL) Take one tablet twice daily  #60 x 3   Entered and Authorized by:   Clerance Lav MD   Signed by:   Clerance Lav MD on 11/08/2008   Method used:   Print then Give to Patient   RxID:   [id number]  ]  Appended Document: medication adjustment/gg   Impression & Recommendations:  Problem # 1:  DM W/MANIFESTATION NEC, TYPE II, UNCONTROLLED (ICD-250.82) Will try to sign up patient for nutrition class. Will also schedule an appointment with [name].   Her updated medication list for this problem  includes:    Lisinopril 20 Mg Tabs (Lisinopril) .Marland Kitchen... Take 1 tablet by mouth once a day    Novolog Mix 70/30 70-30 % Susp (Insulin asp prot & asp (hum)) .Marland Kitchen... 120 units subcutaneously every am    Novolog Mix 70/30 70-30 % Susp (Insulin asp prot & asp (hum)) .Marland KitchenMarland KitchenMarland KitchenMarland Kitchen 65 units subcutaneously every pm

## 2011-01-21 NOTE — Progress Notes (Signed)
Summary: Medication change.  Phone Note Refill Request Message from:  Scriptline on September 24, 2009 4:44 PM  Refills Requested: Medication #1:  NOVOLOG MIX 70/30 70-30 % SUSP 120 units in AM and 65 units PM sub-cutaneous. Pharmacy does not have the Novalog 70/30  They have Novalog plain and Humulin 70/30.   Method Requested: Fax to Local Pharmacy Initial call taken by: Angelina Ok RN,  September 24, 2009 4:46 PM  Follow-up for Phone Call        can't the pharmacy order Novolog Mix 70/30- neither Humulin 70/30 or Novolog are comparable insulins. If they carry Humalog Mix 75/25 that might be comparable.  Follow-up by: [name] RD,CDE,  September 25, 2009 12:53 PM  Additional Follow-up for Phone Call Additional follow up Details #1::        Apparently Miss Oren Bracket will not be able to get Novolog mix from the pharmacy, so far they have been giving her samples but they are out of it and the company does not want to provide it for free any longer. [facility] was told that patient needs to provide original letter which patient did not do and that is why they are refusing to give her insulin for free. Pharmacist has bought one bottle of Novolog Mix and will give this to Miss Hauss but this will not last her but a week at best. Any suggestions on where to proceed?  Thank you  Adriana Quinby Additional Follow-up by: Mliss Sax MD,  September 25, 2009 2:23 PM    Additional Follow-up for Phone Call Additional follow up Details #2::    called [facility]- spoke with Diane. Patient and [facility] came to conclusion yesterday that Lilianne might be better served with medicine that she does not have to rely on  patient assistance if possible. They have been unable to get any Novonodisk insulins and cannot get lantus, but will try Lilly insulin for patient assistance.   Marisel needs 6 vials/month- Jacob's current [facility] card limits her to $ 750.00 from health department so even getting Novolin  ~15.00/vial or  90.00per month charged against her 750.00 will not last full year for Sheniya. Diane and I came up with a plan for insulin for Ms. Daniele that according to [facility]- Ms Heckmann will be in agreement with: Diane at [facility] needs the following prescriptions faxed to her @ [phone]  Prescription #1  for: Novolin 70/30 1 month supply x 4  and to last until patient assistance arrives  prescription #2 for: Humalog Mix 75/25 1 month supply refills x 12 (for patient assitance)  fax to diance at [phone]  Please call me if you have questions. [phone] Follow-up by: [name] RD,CDE,  September 25, 2009 4:06 PM  Additional Follow-up for Phone Call Additional follow up Details #3:: Details for Additional Follow-up Action Taken: both perscriptions faxed and I will bring it downstairs so that it can be scanned into EMR  Thank you Additional Follow-up by: Mliss Sax MD,  September 27, 2009 12:17 PM

## 2011-01-21 NOTE — Progress Notes (Signed)
Summary: phone/gg  Phone Note Call from Patient   Caller: Patient Summary of Call: Pt called and states she used to be taking lyrica 300 mg three times a day and when she looked at her Rx it was only for lyrica 100mg  three times a day.  Looking through EMR I only see the lyrica 100 mg three times a day.  She states this does not help her.She has appointment 12/4 but does not have enough meds to last until then. Please advise Pt # [id number] Initial call taken by: Merrie Roof RN,  October 30, 2008 4:25 PM  Follow-up for Phone Call        I have called patient at home and left her a message to call back clinic. I can not give her more Lyrica since her correct dose is 100 mg 3 times per day. I will have to see her back in clinic before making any changes to her regimen. If Dec is too long for her maybe she can schedule an appointment with another resident who has an opening or if I have anything sooner I will be more than glad to see her as well.

## 2011-01-21 NOTE — Progress Notes (Signed)
Summary: Patient [facility] for Social Work Appmt  Phone Note [employer] placed by: SW Call placed to: Patient Summary of Call: Appmt for Monday June 14th at 10:00 AM  Follow-up for Phone Call        Patient no show for social work appmt. Dorothe Pea  June 04, 2009 11:21 AM

## 2011-01-21 NOTE — Progress Notes (Signed)
Summary: refill/gg  Phone Note Refill Request  on March 20, 2010 4:40 PM  Refills Requested: Medication #1:  TRAMADOL HCL 50 MG TABS One tab up to four times a day for pain  Medication #2:  TRAZODONE HCL 50 MG TABS Take 1 tab by mouth at bedtime as needed to fall sleep. Pt  using tramadol between 2 - 3 a day.  the quantity of # 50 per month is not enough.  Can you increase the # per month. Also she takes 1 1/2 pills of  trazodone at night to sleep.  SHe needs more pills per month.   Method Requested: Electronic Initial call taken by: Merrie Roof RN,  March 20, 2010 4:40 PM  Follow-up for Phone Call        completed Follow-up by: Mliss Sax MD,  March 22, 2010 11:38 AM    Prescriptions: TRAZODONE HCL 50 MG TABS (TRAZODONE HCL) Take 1 tab by mouth at bedtime as needed to fall sleep.  #30 x 3   Entered and Authorized by:   Mliss Sax MD   Signed by:   Mliss Sax MD on 03/22/2010   Method used:   Faxed to ...       [facility] (retail)       [address] [place], [place]  [address]       Ph: [phone]       Fax: [phone]   RxID:   [id number] TRAMADOL HCL 50 MG TABS (TRAMADOL HCL) One tab up to four times a day for pain  #80 x 3   Entered and Authorized by:   Mliss Sax MD   Signed by:   Mliss Sax MD on 03/22/2010   Method used:   Faxed to ...       [facility] (retail)       [address] [place], [place]  [address]       Ph: [phone]       Fax: [phone]   RxID:   [id number]

## 2011-01-21 NOTE — Assessment & Plan Note (Signed)
Summary: F/U FOOT,MC   Vital Signs:  Patient profile:   50 year old female BP sitting:   127 / 84  Vitals Entered By: Lillia Pauls CMA (July 25, 2010 11:47 AM)  Primary Provider:  Mliss Sax MD   History of Present Illness: 50yo female w/ PMH diabetes to office for f/u of R 3rd toe ulceration & R 2nd toe fx.  Ulceration has worsened over the past week.  Skin is loose & appears to be peeling.  Has occasional bleeding, but denies purulent drainage.  Does have associated pain.  Taking antibiotic as prescribed, started earlier this week.  Denies fevers/chills.  Continues to wear post-op shoe for toe fx, denies any significant pain or swelling.  Overall feels fx is improving.  Allergies: 1)  ! Morphine Sulfate (Morphine Sulfate) 2)  ! 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  Alcohol Use - no  Review of  Systems      See HPI  Physical Exam  General:  General:  alert, appropriate dress, and normal appearance.   Msk:  FOOT: R foot with minimal TTP over MTP joint of 2nd toe - is improved from last evaluation.  No ecchymosis, minimal swelling.  Normal ROM of all toes.   Ulceration on plantar aspect of R 3rd toe appears to have worsened.  Skin appears macerated around wound edges.  Skin on plantar aspect of distal phalynx is loose & sloughing.  No surrounding erythema, no purulent drainage. No signs of infection at this time.   Pulses:  +1/4 dorsalis pedis & posterior tib b/l Extremities:  trace edema LE b/l Neurologic:  decreased sensation over distal aspect of R 3rd phalynx, otherwise sensation intact to light touch.   Impression & Recommendations:  Problem # 1:  FOOT ULCER, RIGHT (ICD-707.10) Assessment Deteriorated - Ulceration of plantar surface of R 3rd toe which is worsening. - Will refer to [facility] for further treatment & management. - Should continue antibiotics until Rx completed. - f/u prn  Problem # 2:  CLOSED FRACTURE OF ONE OR MORE PHALANGES OF FOOT (ICD-826.0) - Cont. to wear post-op shoe - f/u prn  Complete Medication List: 1)  Atenolol 100 Mg Tabs (Atenolol) .... Take 1 tablet by mouth once a day 2)  Lisinopril 20 Mg Tabs (Lisinopril) .... Take 1 tablet by mouth once a day 3)  Hydrochlorothiazide 25 Mg Tabs (Hydrochlorothiazide) .... Take 1 tablet by mouth once a day 4)  Advair Diskus 250-50 Mcg/dose Misc (Fluticasone-salmeterol) .... One puff two times a day 5)  Plavix 75 Mg Tabs (Clopidogrel bisulfate) .... Take 1 tablet by mouth once a day 6)  Ra Pen Needles 31g X 8 Mm Misc (Insulin pen needle) .... Use 3  times per day to check the sugar level 7)  Lyrica 100 Mg Caps (Pregabalin) .... Take 1 tablet by mouth three times a day 8)  Tramadol Hcl 50 Mg Tabs (Tramadol hcl) .... One tab up to four times a day for pain 9)  Humalog Mix 75/25 Pen 75-25 % Susp (Insulin  lispro prot & lispro) .... Inject under the skin 110 units in the am and 55 units in the evening every day 10)  Truetrack Test Strp (Glucose blood) .... Use to check blood sugar twice daily 11)  Lancets Misc (Lancets) .... Use to check blood sugar twice daily 12)  Nitrostat 0.4 Mg Subl (Nitroglycerin) .... Take one tablet under your tongue with severe chest pain, repeat in 5 minutes. attend to doctor/hospital if had to take more then 3 in 20 minutes 13)  Diflucan 100 Mg Tabs (Fluconazole) .... Take 1 tablet by mouth two times a day 14)  Lexapro 10 Mg Tabs (Escitalopram oxalate) .... Take 1 tablet by mouth once a day 15)  Hydrocodone-acetaminophen 5-325 Mg Tabs (Hydrocodone-acetaminophen) .... Take 1 tablet by mouth two times a day as needed for pain. 16)  Cephalexin 500 Mg Caps (Cephalexin) .Marland Kitchen.. 1 tab by mouth two times a day x 10 days  Patient Instructions: 1)  APPT WITH WOUND [facility]ARE CENTER IS AUG 5TH AT 10:20. ADDRESS IS [address] [phone]

## 2011-01-21 NOTE — Assessment & Plan Note (Signed)
Summary: ACUTE-RIGHT SIDE OF FACE NUMBNESS PER Jennifer([name])/CFB   Vital Signs:  Patient Profile:   50 Years Old Female Height:     66 inches (167.64 cm) Weight:      248.0 pounds (112.73 kg) BMI:     40.17 O2 Sat:      98 % Temp:     97.5 degrees F (36.39 degrees C) oral Pulse rate:   84 / minute BP sitting:   168 / 85  (right arm)  Vitals Entered By: Filomena Jungling (August 11, 2007 3:05 PM)             Is Patient Diabetic? Yes  Nutritional Status BMI of > 30 = obese  Does patient need assistance? Functional Status Self care Ambulation Normal   PCP:  Jennifer Pitt MD   History of Present Illness: [name] began noticing on monday that her bottom lip was numb.  As the day progressed, she developed numbness of her top lip as well.  The numbness spread to her cheek, nose, and the right side of her face this morning.  She also notes that her cheek is swollen. She states that she has also had difficulty swallowing over since saturday, with liquid and solids getting stuck in her throat, and only passing with time.  She has also noticed that she has been having mild headaches as well.  She also feels like her sense of smell and taste are not as strong, and that she is unable to talk the same.  Nothing like this has ever happened before, she has not tried any new medications, or eaten any new foods.  She ran out of her blood pressure medications about 2 weeks ago, but has not yet run out of her insulin.  She stores the insulin in her fridge, and has been using this batch over the last 30 days.  She denies congestion, fever, chills, dizzyness, runny nose.   She feels weakness in her tongue on the right, in addition to her lips.   She has had a problem with itching for years, and she has been taking benadryl for this.  She has been taking benadryl 2-3 times a day for the past 2-3 weeks.  The benadryl does not help with the symptoms.    Current Allergies: ! MORPHINE SULFATE (MORPHINE  SULFATE) ! * IODINATED CONTRAST    Risk Factors: Tobacco use:  current    Year started:  years Seatbelt use:  100 %  Mammogram History:    Date of Last Mammogram:  07/26/2004  PAP Smear History:    Date of Last PAP Smear:  08/17/2001   Review of Systems      See HPI   Physical Exam  General:     alert and overweight-appearing.   Head:     normocephalic.  She did have a slightly mottled red swollen area involving her cheek, that was slightly warm to the touch.   Ears:     R ear normal, L ear normal, and no external deformities.  Normal white light reflex observed, no pain during exam. Nose:     Swollen nasal turbinates, no discharge or dried blood Mouth:     edentulous.  macroglossia with unknown baseline, and her right palate may be slightly larger than her left.  No pus observied  No tonsillar exudates. Lungs:     normal respiratory effort, normal breath sounds, no crackles, and no wheezes.   Heart:     normal rate, regular  rhythm, and no murmur.      Impression & Recommendations:  Problem # 1:  BELL'S PALSY (ICD-351.0) [name]'s presentation of 2-3 days of unilateral facial numbness without a rash, recent bug bite, pain, or any indication of a viral or bacterial infection is most consistent with Bell's Palsy.  Though her distribution could possibly represent the 5th cranial nerve, her lack of pain and features of the presentation make trigeminal neuralgia very unlikely.  Will start prednisone 60 mg by mouth once daily for 7 days, and have her return in one week to see if there has been any effect.  She has been advised to contact the clinic earlier if her problems worsen.  Problem # 2:  Preventive Health Care (ICD-V70.0) [name] is having many financial issues, including her house being in foreclosure, and her not being able to apply for financial aid in our office because she has not yet filed her taxes.  She has also stopped taking many of her medications,  including all of her blood pressure medications.  I will favor focusing on her primary issue for presenting acutely to the clinic for now, and when it resolves, her chronic health care issues can be addressed by Dr. Ardyth Harps.  She will likely need samples whenever possible, and have appointments scheduled before she is going to run out, as she has shown that she will not take the initiative to schedule an appointment before running out of her medications.  Complete Medication List: 1)  Atenolol 100 Mg Tabs (Atenolol) .... Take 1 tablet by mouth once a day 2)  Lisinopril 20 Mg Tabs (Lisinopril) .... Take 1 tablet by mouth once a day 3)  Hydrochlorothiazide 25 Mg Tabs (Hydrochlorothiazide) .... Take 1 tablet by mouth once a day 4)  Lyrica 100 Mg Caps (Pregabalin) .... Take 1 tablet by mouth three times a day 5)  Advair Diskus 250-50 Mcg/dose Misc (Fluticasone-salmeterol) .... One puff two times a day 6)  Albuterol 90 Mcg/act Aers (Albuterol) .... One puff every 6 hrs as needed for wheezing 7)  Novolog Mix 70/30 70-30 % Susp (Insulin asp prot & asp (hum)) .Marland Kitchen.. 100 units subcutaneously every am 8)  Novolog Mix 70/30 70-30 % Susp (Insulin asp prot & asp (hum)) .... 50 units subcutaneously every pm 9)  Naprosyn 500 Mg Tabs (Naproxen) .... Take 1 tablet by mouth two times a day 10)  Flexeril 10 Mg Tabs (Cyclobenzaprine hcl) .Marland Kitchen.. 1 tablet at bedtime 11)  Tylenol Extra Strength 500 Mg Tabs (Acetaminophen) .... Take 1 tablet by mouth every 6 hours as needed for pain 12)  Ibuprofen 600 Mg Tabs (Ibuprofen) .... Take 1 tablet every 8 hours as needed for pain. 13)  Prednisone 10 Mg Tabs (Prednisone) .... Take 6 tabs by mouth once daily for 7 days  Other Orders: T-Comprehensive Metabolic Panel ([id number])   Patient Instructions: 1)  Please return to the clinic in one week to have your facial symptoms looked at.  If you develop eye symptoms, and are unable to close your eyes, please contact the clinic to be  seen earlier. 2)  Bring in a list of all your medications that you still have at your next appointment, and if your current problem is better, we will start adding your medications back. 3)  You need to get your taxes in order so that [name] can help you acquire your medications.    Prescriptions: PREDNISONE 10 MG  TABS (PREDNISONE) take 6 tabs by mouth once daily for  7 days  #42 x 0   Entered and Authorized by:   Valetta Close MD   Signed by:   Valetta Close MD on 08/14/2007   Method used:   Samples Given   RxID:   [id number]    Vital Signs:  Patient Profile:   50 Years Old Female Height:     66 inches (167.64 cm) Weight:      248.0 pounds (112.73 kg) BMI:     40.17 O2 Sat:      98 % Temp:     97.5 degrees F (36.39 degrees C) oral Pulse rate:   84 / minute BP sitting:   168 / 85             Last PAP Result Normal PapHx  Normal (08/17/2001 6:26:06 PM)

## 2011-01-21 NOTE — Consult Note (Signed)
Summary: [facility] :Dr. Darrick Penna  [facility] :Dr. Darrick Penna   Imported By: Florinda Marker 11/15/2007 13:49:39  _____________________________________________________________________  External Attachment:    Type:   Image     Comment:   External Document

## 2011-01-21 NOTE — Assessment & Plan Note (Signed)
Summary: F/U/APPT/MAGICK/VS   Vital Signs:  Patient profile:   50 year old female Height:      66 inches (167.64 cm) Weight:      159.0 pounds (72.27 kg) BMI:     25.76 Temp:     98.5 degrees F (36.94 degrees C) oral Pulse rate:   64 / minute BP sitting:   111 / 74  (right arm) Cuff size:   regular  Vitals Entered By: Theotis Barrio NT II (July 06, 2009 2:06 PM) CC: FOLLOW UP APPT  LEFT FOOT / NEED APPT FOR LYRICA / , Depression Is Patient Diabetic? Yes  Pain Assessment Patient in pain? yes     Location: FEET/HANDS Intensity:    10 Type: SHAPR/ACHE Onset of pain  Chronic Nutritional Status BMI of > 30 = obese CBG Result 159 CBG Device ID PT OWN MACHINE  Have you ever been in a relationship where you felt threatened, hurt or afraid?No   Does patient need assistance? Functional Status Self care Ambulation Normal Comments NEED RX FOR LYRICA /  LEFT FOOT FOLLOW UP / DM / FEET AND HANDS PAIN   Primary Care Provider:  Mliss Sax MD  CC:  FOLLOW UP APPT  LEFT FOOT / NEED APPT FOR LYRICA /  and Depression.  History of Present Illness: 50 year old female with pmh as described on the EMR. Who came to the clinic today for followup of her left diabetic foot ulcer.  Pt injured herself 2 weeks ago, received stiches to keep skin together and have received 5 days of keflex and recently started on clyndamycin and ciprofloxacin. Patient has been compliant with her medications and antibiotics, but has not be seen by wound care center yet.  BP and blood sugar are well controlled and patient is following a low sodium and low carbohydrates diet.  Depression History:      The patient denies a depressed mood most of the day and a diminished interest in her usual daily activities.        The patient denies that she feels like life is not worth living, denies that she wishes that she were dead, and denies that she has thought about ending her life.         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) 2)  Inadequate Material Resources  (ICD-V60.2) 3)  Boils, Recurrent  (ICD-680.9) 4)  Nausea  (ICD-787.02) 5)  Weight Loss  (ICD-783.21) 6)  Dysphagia Unspecified  (ICD-787.20) 7)  Gastroesophageal Reflux Disease  (ICD-530.81) 8)  Diab W/neuro Manifests Type Ii/uns Not Uncntrl  (ICD-250.60) 9)  Ulcer of Other Part of Foot  (ICD-707.15) 10)  Other&unspecified Diseases The Oral Soft Tissues  (ICD-528.9) 11)  Abscess, Skin  (ICD-682.9) 12)  Depression  (ICD-311) 13)  Diabetic Foot Ulcer, Right  (ICD-250.80) 14)  Claudication  (ICD-443.9) 15)  Bell's Palsy  (ICD-351.0) 16)  Back Pain  (ICD-724.5) 17)  Health Screening  (ICD-V70.0) 18)  Dm W/manifestation Nec, Type II, Uncontrolled  (ICD-250.82) 19)  Abscess, Perirectal, Hx of  (ICD-V13.3) 20)  Renal Failure, Acute, Hx of  (ICD-V13.09) 21)  Tobacco Abuse  (ICD-305.1) 22)  COPD  (ICD-496) 23)  Hyperlipidemia Nec/nos  (ICD-272.4) 24)  Transaminases, Serum, Elevated  (ICD-790.4) 25)  Diabetic Peripheral Neuropathy  (ICD-250.60) 26)  Hypertension, Benign Essential  (ICD-401.1)  Medications Prior to Update: 1)  Atenolol 100 Mg Tabs (Atenolol) .... Take 1 Tablet By Mouth Once A Day 2)  Lisinopril  20 Mg Tabs (Lisinopril) .... Take 1 Tablet By Mouth Once A Day 3)  Hydrochlorothiazide 25 Mg Tabs (Hydrochlorothiazide) .... Take 1 Tablet By Mouth Once A Day 4)  Advair Diskus 250-50 Mcg/dose Misc (Fluticasone-Salmeterol) .... One Puff Two Times A Day 5)  Novolog Mix 70/30 70-30 % Susp (Insulin Asp Prot & Asp (Hum)) .Marland Kitchen.. 120 Units Subcutaneously Every Am 6)  Novolog Mix 70/30 70-30 % Susp (Insulin Asp Prot & Asp (Hum)) .... 65 Units Subcutaneously Every Pm 7)  Tylenol Extra Strength 500 Mg Tabs (Acetaminophen) .... Take 1 Tablet By Mouth Every 6 Hours As Needed For Pain 8)  Pravachol 20 Mg  Tabs (Pravastatin Sodium) .... Take 1 Tab By Mouth At Bedtime 9)  Plavix 75 Mg  Tabs (Clopidogrel Bisulfate) .... Take 1  Tablet By Mouth Once A Day 10)  Paxil 20 Mg Tabs (Paroxetine Hcl) .... Take 1 Tablet By Mouth Once A Day 11)  Ra Pen Needles 31g X 8 Mm Misc (Insulin Pen Needle) .... Use 3 Times Per Day To Check The Sugar Level 12)  Lyrica 50 Mg Caps (Pregabalin) .... Take 1 Capsule By Mouth Three Times A Day 13)  Cleocin 150 Mg Caps (Clindamycin Hcl) .... Take Two Tablets Three Times A Day 14)  Cipro 500 Mg Tabs (Ciprofloxacin Hcl) .... Take 1 Tablet By Mouth Two Times A Day 15)  Amitriptyline Hcl 75 Mg Tabs (Amitriptyline Hcl) .... Take 1 Tablet By Mouth Two Times A Day  Current Medications (verified): 1)  Atenolol 100 Mg Tabs (Atenolol) .... Take 1 Tablet By Mouth Once A Day 2)  Lisinopril 20 Mg Tabs (Lisinopril) .... Take 1 Tablet By Mouth Once A Day 3)  Hydrochlorothiazide 25 Mg Tabs (Hydrochlorothiazide) .... Take 1 Tablet By Mouth Once A Day 4)  Advair Diskus 250-50 Mcg/dose Misc (Fluticasone-Salmeterol) .... One Puff Two Times A Day 5)  Novolog Mix 70/30 70-30 % Susp (Insulin Asp Prot & Asp (Hum)) .Marland Kitchen.. 120 Units Subcutaneously Every Am 6)  Novolog Mix 70/30 70-30 % Susp (Insulin Asp Prot & Asp (Hum)) .... 65 Units Subcutaneously Every Pm 7)  Tylenol Extra Strength 500 Mg Tabs (Acetaminophen) .... Take 1 Tablet By Mouth Every 6 Hours As Needed For Pain 8)  Pravachol 20 Mg  Tabs (Pravastatin Sodium) .... Take 1 Tab By Mouth At Bedtime 9)  Plavix 75 Mg  Tabs (Clopidogrel Bisulfate) .... Take 1 Tablet By Mouth Once A Day 10)  Paxil 20 Mg Tabs (Paroxetine Hcl) .... Take 1 Tablet By Mouth Once A Day 11)  Ra Pen Needles 31g X 8 Mm Misc (Insulin Pen Needle) .... Use 3 Times Per Day To Check The Sugar Level 12)  Lyrica 50 Mg Caps (Pregabalin) .... Take 1 Capsule By Mouth Three Times A Day 13)  Cleocin 150 Mg Caps (Clindamycin Hcl) .... Take Two Tablets Three Times A Day 14)  Cipro 500 Mg Tabs (Ciprofloxacin Hcl) .... Take 1 Tablet By Mouth Two Times A Day 15)  Amitriptyline Hcl 75 Mg Tabs (Amitriptyline Hcl)  .... Take 1 Tablet By Mouth Two Times A Day  Allergies (verified): 1)  ! Morphine Sulfate (Morphine Sulfate) 2)  ! * Iodinated Contrast  Past History:  Past Medical History: Last updated: 11/24/2008 Current Problems:  RENAL FAILURE, ACUTE, HX OF (ICD-V13.09) TOBACCO ABUSE (ICD-305.1)     COPD (ICD-496) HYPERLIPIDEMIA NEC/NOS (ICD-272.4)     TRANSAMINASES, SERUM, ELEVATED (ICD-790.4) Diabetes mellitus type II     DIABETIC PERIPHERAL NEUROPATHY (ICD-250.60)  DIABETIC FOOT ULCER, TOE (ICD-250.80) HYPERTENSION, BENIGN ESSENTIAL (ICD-401.1)  Family History: Last updated: 06/22/2008 Mother  DM2, CHF, living Father asthma, living Brothers and sisters asthma since childhood  Social History: Last updated: 03/28/2009 Lives with daughter-in-law in apartment, currently unemployed  Risk Factors: Smoking Status: current (07/02/2009) Packs/Day: 0.5 (07/02/2009) Passive Smoke Exposure: yes (07/02/2009)  Review of Systems  The patient denies anorexia, fever, chest pain, syncope, dyspnea on exertion, abdominal pain, melena, hematochezia, severe indigestion/heartburn, incontinence, and depression.    Physical Exam  General:  overweight-appearing.   Lungs:  Normal respiratory effort, chest expands symmetrically. Lungs are clear to auscultation, no crackles or wheezes. Heart:  Normal rate and regular rhythm. S1 and S2 normal without gallop, murmur, click, rub or other extra sounds. Abdomen:  soft and non-tender.   Extremities:  approximately 3cm wide 1 -2 cm deep wound on left 4th and 5th toe web. sutures are loose and couple of them completely gone. cavity is lined with white fibrous tissue. clean, no pus or discharge noted; sorrounding swelling extending up to midfoot; no erythema or warmth felt. No pus. Neurologic:  alert & oriented X3, cranial nerves II-XII intact and strength normal in all extremities.   Impression & Recommendations:  Problem # 1:  DIABETIC FOOT ULCER, LEFT  (ICD-250.80) Will continue current diabetic regimen and also antibiotic medication prescribed during previous visit. Pt will follow with wound care cente for dbridment and appropiate wound care on Tuesday 20, 2010. Pt advised to call office in foot becomes really tender, if she noticed pus drainage, fever or further signs of infection.  Her updated medication list for this problem includes:    Lisinopril 20 Mg Tabs (Lisinopril) .Marland Kitchen... Take 1 tablet by mouth once a day    Novolog Mix 70/30 70-30 % Susp (Insulin asp prot & asp (hum)) .Marland Kitchen... 120 units subcutaneously every am    Novolog Mix 70/30 70-30 % Susp (Insulin asp prot & asp (hum)) .Marland KitchenMarland KitchenMarland KitchenMarland Kitchen 65 units subcutaneously every pm  Problem # 2:  DM W/MANIFESTATION NEC, TYPE II, UNCONTROLLED (ICD-250.82) A1C not a goal. insulin increased one month ago and CBG has been much better now. Pt advised to take her medication every day as indicated and also to follow a low carb diet. Will recheck A1C next month and adjust her medications as needed depending on results.  Her updated medication list for this problem includes:    Lisinopril 20 Mg Tabs (Lisinopril) .Marland Kitchen... Take 1 tablet by mouth once a day    Novolog Mix 70/30 70-30 % Susp (Insulin asp prot & asp (hum)) .Marland Kitchen... 120 units subcutaneously every am    Novolog Mix 70/30 70-30 % Susp (Insulin asp prot & asp (hum)) .Marland KitchenMarland KitchenMarland KitchenMarland Kitchen 65 units subcutaneously every pm  Labs Reviewed: Creat: 0.84 (01/29/2009)     Last Eye Exam: No diabetic retinopathy.    (05/31/2008) Reviewed HgBA1c results: 11.8 (05/28/2009)  12.1 (01/29/2009)  Problem # 3:  HYPERLIPIDEMIA NEC/NOS (ICD-272.4) LDL at goal, last time checked was 79. Continue current therapy.   Her updated medication list for this problem includes:    Pravachol 20 Mg Tabs (Pravastatin sodium) .Marland Kitchen... Take 1 tab by mouth at bedtime  Problem # 4:  HYPERTENSION, BENIGN ESSENTIAL (ICD-401.1) No medication changes needed. BP well controlled and at goal. Will advised patient  to follow a low sodium diet.  Her updated medication list for this problem includes:    Atenolol 100 Mg Tabs (Atenolol) .Marland Kitchen... Take 1 tablet by mouth once a day  Lisinopril 20 Mg Tabs (Lisinopril) .Marland Kitchen... Take 1 tablet by mouth once a day    Hydrochlorothiazide 25 Mg Tabs (Hydrochlorothiazide) .Marland Kitchen... Take 1 tablet by mouth once a day  Complete Medication List: 1)  Atenolol 100 Mg Tabs (Atenolol) .... Take 1 tablet by mouth once a day 2)  Lisinopril 20 Mg Tabs (Lisinopril) .... Take 1 tablet by mouth once a day 3)  Hydrochlorothiazide 25 Mg Tabs (Hydrochlorothiazide) .... Take 1 tablet by mouth once a day 4)  Advair Diskus 250-50 Mcg/dose Misc (Fluticasone-salmeterol) .... One puff two times a day 5)  Novolog Mix 70/30 70-30 % Susp (Insulin asp prot & asp (hum)) .Marland Kitchen.. 120 units subcutaneously every am 6)  Novolog Mix 70/30 70-30 % Susp (Insulin asp prot & asp (hum)) .... 65 units subcutaneously every pm 7)  Tylenol Extra Strength 500 Mg Tabs (Acetaminophen) .... Take 1 tablet by mouth every 6 hours as needed for pain 8)  Pravachol 20 Mg Tabs (Pravastatin sodium) .... Take 1 tab by mouth at bedtime 9)  Plavix 75 Mg Tabs (Clopidogrel bisulfate) .... Take 1 tablet by mouth once a day 10)  Paxil 20 Mg Tabs (Paroxetine hcl) .... Take 1 tablet by mouth once a day 11)  Ra Pen Needles 31g X 8 Mm Misc (Insulin pen needle) .... Use 3 times per day to check the sugar level 12)  Lyrica 50 Mg Caps (Pregabalin) .... Take 1 capsule by mouth three times a day 13)  Cleocin 150 Mg Caps (Clindamycin hcl) .... Take two tablets three times a day 14)  Cipro 500 Mg Tabs (Ciprofloxacin hcl) .... Take 1 tablet by mouth two times a day 15)  Amitriptyline Hcl 75 Mg Tabs (Amitriptyline hcl) .... Take 1 tablet by mouth two times a day  Patient Instructions: 1)  Please schedule a follow-up appointment in 1-2 week. 2)  Continue taking your antibiotics as prescribed. 3)  Keep your foot clean and dry. 4)  We would call you  with the wound care center appointment details, hopefully monday or tuesday. 5)  Take your medications as prescribed.  Prevention & Chronic Care Immunizations   Influenza vaccine: Not documented    Tetanus booster: Not documented    Pneumococcal vaccine: Not documented  Colorectal Screening   Hemoccult: Not documented    Colonoscopy: Not documented  Other Screening   Pap smear: Normal  (08/17/2001)    Mammogram: No specific mammographic evidence of malignancy.  Assessment: BIRADS 1.   (04/11/2008)   Mammogram due: 04/2009   Smoking status: current  (07/02/2009)   Smoking cessation counseling: yes  (07/02/2009)  Diabetes Mellitus   HgbA1C: 11.8  (05/28/2009)    Eye exam: No diabetic retinopathy.     (05/31/2008)   Eye exam due: 05/2009    Foot exam: yes  (03/28/2009)   High risk foot: Not documented   Foot care education: Not documented    Urine microalbumin/creatinine ratio: 1.7  (09/29/2007)  Lipids   Total Cholesterol: 171  (11/24/2008)   LDL: 79  (11/24/2008)   LDL Direct: Not documented   HDL: 30  (11/24/2008)   Triglycerides: 310  (11/24/2008)    SGOT (AST): 21  (01/29/2009)   SGPT (ALT): 45  (01/29/2009)   Alkaline phosphatase: 109  (01/29/2009)   Total bilirubin: 0.4  (01/29/2009)  Hypertension   Last Blood Pressure: 111 / 74  (07/06/2009)   Serum creatinine: 0.84  (01/29/2009)   Serum potassium 4.3  (01/29/2009)  Self-Management Support :    Diabetes  self-management support: Not documented    Hypertension self-management support: Not documented    Lipid self-management support: Not documented

## 2011-01-21 NOTE — Assessment & Plan Note (Signed)
Summary: rectal cyst/gg   Vital Signs:  Patient profile:   50 year old female Weight:      254.3 pounds (115.59 kg) BMI:     40.45 Temp:     97.5 degrees F (36.39 degrees C) oral Pulse rate:   83 / minute BP sitting:   144 / 88  (left arm) Cuff size:   regular  Vitals Entered By: Cynda Familia Duncan Dull) (November 05, 2010 8:46 AM) CC: Depression, rectal pain x  Is Patient Diabetic? Yes Did you bring your meter with you today? No Nutritional Status BMI of > 30 = obese CBG Result 257  Does patient need assistance? Functional Status Self care Ambulation Normal   Diabetic Foot Exam Foot Inspection Is there a history of a foot ulcer?              Yes Is there a foot ulcer now?              No Can the patient see the bottom of their feet?          Yes Are the shoes appropriate in style and fit?          Yes Is there swelling or an abnormal foot shape?          No Are the toenails long?                Yes Are the toenails thick?                Yes Are the toenails ingrown?              Yes Is there heavy callous build-up?              Yes Is there pain in the calf muscle (Intermittent claudication) when walking?    NoIs there a claw toe deformity?              No Is there elevated skin temperature?            No Is there limited ankle dorsiflexion?            No Is there foot or ankle muscle weakness?            No  Diabetic Foot Care Education Patient educated on appropriate care of diabetic feet.  Pulse Check          Right Foot          Left Foot Posterior Tibial:        normal            normal Dorsalis Pedis:        normal            normal  High Risk Feet? Yes Set Next Diabetic Foot Exam here: 11/06/2011   10-g (5.07) Semmes-Weinstein Monofilament Test Performed by: Lynn Ito          Right Foot          Left Foot Site 1         abnormal         abnormal Site 2         abnormal         abnormal Site 3         abnormal         abnormal Site 4          abnormal         abnormal Site 5  abnormal         abnormal Site 6         abnormal         abnormal  Impression      abnormal         abnormal   Primary Care Provider:  Mliss Sax MD  CC:  Depression and rectal pain x .  History of Present Illness: 50 yo female with PMH of poorly controlled DM, obesity, HTN and HLD who presents to [facility] for her perianal abscess. She has the abscess 2 months ago, it was about 4 cm and she broke it by herself and got some pus drainage. During the past 2 months, it has been flare every 2-3 weeks, no fever, abdominal pain. Not took any meds for it. She has been using her insuling and her CBg runs usually 200s. No other c/o, including SOB, CP, diarrhea or dysuria. Current smoker 1PPD.  Depression History:      The patient is having a depressed mood most of the day but denies diminished interest in her usual daily activities.        The patient denies that she has thought about ending her life.         Preventive Screening-Counseling & Management  Alcohol-Tobacco     Alcohol drinks/day: 0     Smoking Status: current     Smoking Cessation Counseling: yes     Packs/Day: 1 ppd     Year Started: years     Passive Smoke Exposure: yes  Problems Prior to Update: 1)  Foot Ulcer, Right  (ICD-707.10) 2)  Closed Fracture of One or More Phalanges of Foot  (ICD-826.0) 3)  Adhesive Capsulitis, Left  (ICD-726.0) 4)  Shoulder Pain, Left  (ICD-719.41) 5)  Hyperlipidemia Nec/nos  (ICD-272.4) 6)  Hypertension, Benign Essential  (ICD-401.1) 7)  Hypertension  (ICD-401.9) 8)  Chest Pain  (ICD-786.50) 9)  Insomnia  (ICD-780.52) 10)  Candidiasis, Vaginal  (ICD-112.1) 11)  Hip Pain, Right  (ICD-719.45) 12)  Boils, Recurrent  (ICD-680.9) 13)  Diab W/neuro Manifests Type Ii/uns Not Uncntrl  (ICD-250.60) 14)  Depression  (ICD-311) 15)  Diabetic Foot Ulcer, Right  (ICD-250.80) 16)  Claudication  (ICD-443.9) 17)  Dm W/manifestation Nec, Type II,  Uncontrolled  (ICD-250.82) 18)  Tobacco Abuse  (ICD-305.1) 19)  COPD  (ICD-496) 20)  Transaminases, Serum, Elevated  (ICD-790.4) 21)  Diabetic Peripheral Neuropathy  (ICD-250.60) 22)  Knee Pain  (ICD-719.46) 23)  Fracture, Toe, Left  (ICD-826.0) 24)  Diabetic Foot Ulcer, Left  (ICD-250.80) 25)  Inadequate Material Resources  (ICD-V60.2) 26)  Nausea  (ICD-787.02) 27)  Weight Loss  (ICD-783.21) 28)  Dysphagia Unspecified  (ICD-787.20) 29)  Gastroesophageal Reflux Disease  (ICD-530.81) 30)  Ulcer of Other Part of Foot  (ICD-707.15) 31)  Other&unspecified Diseases The Oral Soft Tissues  (ICD-528.9) 32)  Abscess, Skin  (ICD-682.9) 33)  Bell's Palsy  (ICD-351.0) 34)  Back Pain  (ICD-724.5) 35)  Health Screening  (ICD-V70.0) 36)  Abscess, Perirectal, Hx of  (ICD-V13.3) 37)  Renal Failure, Acute, Hx of  (ICD-V13.09) 38)  Glaucoma  (ICD-365.9) 39)  Disc Disease, Lumbar  (ICD-722.52)  Medications Prior to Update: 1)  Atenolol 100 Mg Tabs (Atenolol) .... Take 1 Tablet By Mouth Once A Day 2)  Lisinopril 20 Mg Tabs (Lisinopril) .... Take 1 Tablet By Mouth Once A Day 3)  Hydrochlorothiazide 25 Mg Tabs (Hydrochlorothiazide) .... Take 1 Tablet By Mouth Once A Day 4)  Advair Diskus  250-50 Mcg/dose Misc (Fluticasone-Salmeterol) .... One Puff Two Times A Day 5)  Plavix 75 Mg  Tabs (Clopidogrel Bisulfate) .... Take 1 Tablet By Mouth Once A Day 6)  Ra Pen Needles 31g X 8 Mm Misc (Insulin Pen Needle) .... Use 3 Times Per Day To Check The Sugar Level 7)  Lyrica 100 Mg Caps (Pregabalin) .... Take 1 Tablet By Mouth Three Times A Day 8)  Humalog Mix 75/25 Pen 75-25 % Susp (Insulin Lispro Prot & Lispro) .... Inject Under The Skin 110 Units in The Am and 55 Units in The Evening Every Day 9)  Truetrack Test  Strp (Glucose Blood) .... Use To Check Blood Sugar Twice Daily 10)  Lancets  Misc (Lancets) .... Use To Check Blood Sugar Twice Daily 11)  Nitrostat 0.4 Mg Subl (Nitroglycerin) .... Take One Tablet Under  Your Tongue With Severe Chest Pain, Repeat in 5 Minutes. Attend To Doctor/hospital If Had To Take More Then 3 in 20 Minutes 12)  Diflucan 100 Mg Tabs (Fluconazole) .... Take 1 Tablet By Mouth Two Times A Day 13)  Lexapro 10 Mg Tabs (Escitalopram Oxalate) .... Take 1 Tablet By Mouth Once A Day 14)  Hydrocodone-Acetaminophen 5-325 Mg Tabs (Hydrocodone-Acetaminophen) .... Take 1 Tablet By Mouth Two Times A Day As Needed For Pain. 15)  Zoloft 25 Mg Tabs (Sertraline Hcl) .... Take 1 Tablet By Mouth Once A Day 16)  Trazodone Hcl 100 Mg Tabs (Trazodone Hcl) .... Take 1/2 Tablet At Bedtime As Needed For Insomnia 17)  Tramadol Hcl 50 Mg Tabs (Tramadol Hcl) .... Take 1 Tablet 2-3 Times Per Day As Needed For Pain  Current Medications (verified): 1)  Atenolol 100 Mg Tabs (Atenolol) .... Take 1 Tablet By Mouth Once A Day 2)  Lisinopril 20 Mg Tabs (Lisinopril) .... Take 1 Tablet By Mouth Once A Day 3)  Hydrochlorothiazide 25 Mg Tabs (Hydrochlorothiazide) .... Take 1 Tablet By Mouth Once A Day 4)  Advair Diskus 250-50 Mcg/dose Misc (Fluticasone-Salmeterol) .... One Puff Two Times A Day 5)  Plavix 75 Mg  Tabs (Clopidogrel Bisulfate) .... Take 1 Tablet By Mouth Once A Day 6)  Ra Pen Needles 31g X 8 Mm Misc (Insulin Pen Needle) .... Use 3 Times Per Day To Check The Sugar Level 7)  Lyrica 100 Mg Caps (Pregabalin) .... Take 1 Tablet By Mouth Three Times A Day 8)  Humalog Mix 75/25 Pen 75-25 % Susp (Insulin Lispro Prot & Lispro) .... Inject Under The Skin 110 Units in The Am and 55 Units in The Evening Every Day 9)  Truetrack Test  Strp (Glucose Blood) .... Use To Check Blood Sugar Twice Daily 10)  Lancets  Misc (Lancets) .... Use To Check Blood Sugar Twice Daily 11)  Nitrostat 0.4 Mg Subl (Nitroglycerin) .... Take One Tablet Under Your Tongue With Severe Chest Pain, Repeat in 5 Minutes. Attend To Doctor/hospital If Had To Take More Then 3 in 20 Minutes 12)  Diflucan 100 Mg Tabs (Fluconazole) .... Take 1 Tablet By  Mouth Two Times A Day 13)  Lexapro 10 Mg Tabs (Escitalopram Oxalate) .... Take 1 Tablet By Mouth Once A Day 14)  Hydrocodone-Acetaminophen 5-325 Mg Tabs (Hydrocodone-Acetaminophen) .... Take 1 Tablet By Mouth Two Times A Day As Needed For Pain. 15)  Zoloft 25 Mg Tabs (Sertraline Hcl) .... Take 1 Tablet By Mouth Once A Day 16)  Trazodone Hcl 100 Mg Tabs (Trazodone Hcl) .... Take 1/2 Tablet At Bedtime As Needed For Insomnia 17)  Tramadol Hcl 50  Mg Tabs (Tramadol Hcl) .... Take 1 Tablet 2-3 Times Per Day As Needed For Pain  Allergies: 1)  ! Morphine Sulfate (Morphine Sulfate) 2)  ! * Iodinated Contrast  Past History:  Past Medical History: Last updated: 11/30/2009 Current Problems:  HYPERLIPIDEMIA NEC/NOS (ICD-272.4) HYPERTENSION, BENIGN ESSENTIAL (ICD-401.1) CANDIDIASIS, VAGINAL (ICD-112.1) BOILS, RECURRENT (ICD-680.9) DIAB W/NEURO MANIFESTS TYPE II/UNS NOT UNCNTRL (ICD-250.60) DEPRESSION (ICD-311) DIABETIC FOOT ULCER, RIGHT (ICD-250.80) PVD DM W/MANIFESTATION NEC, TYPE II, UNCONTROLLED (ICD-250.82) COPD (ICD-496) TRANSAMINASES, SERUM, ELEVATED (ICD-790.4) DIABETIC PERIPHERAL NEUROPATHY (ICD-250.60) GASTROESOPHAGEAL REFLUX DISEASE (ICD-530.81) BELL'S PALSY (ICD-351.0) ABSCESS, PERIRECTAL, HX OF (ICD-V13.3) RENAL FAILURE, ACUTE, HX OF (ICD-V13.09) with contrast H/O elevated LFTs with statins GLAUCOMA (ICD-365.9) DISC DISEASE, 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   Social History: Last updated: 11/30/2009 Lives with daughter-in-law in apartment, currently unemployed Divorced  Tobacco  Use - Yes.  Alcohol Use - no  Risk Factors: Smoking Status: current (11/05/2010) Packs/Day: 1 ppd (11/05/2010) Passive Smoke Exposure: yes (11/05/2010)  Family History: Reviewed history from 06/28/2010 and no changes required. Mother  DM2, CHF, living Father asthma, living Brothers and sisters asthma since childhood Younger sister had heart attack at age of 45, since then she is doing well.   Social History: Reviewed history from 11/30/2009 and no changes required. Lives with daughter-in-law in apartment, currently unemployed Divorced  Tobacco Use - Yes.  Alcohol Use - no  Review of Systems  The patient denies fever, chest pain, syncope, dyspnea on exertion, peripheral edema, prolonged cough, headaches, hemoptysis, abdominal pain, and melena.    Physical Exam  General:  alert, well-developed, well-nourished, well-hydrated, and overweight-appearing.   Nose:  no nasal discharge.   Mouth:  pharynx pink and moist.   Neck:  supple.   Lungs:  normal respiratory effort, normal breath sounds, no crackles, and no wheezes.   Heart:  normal rate, regular rhythm, no murmur, and no JVD.   Abdomen:  soft, non-tender, normal bowel sounds, and no distention.   Rectal:  There is a 0.8 cm skin lesion in the right buttock near anal area, swelling, no fluctuation.  Msk:  normal ROM, no joint tenderness, no joint swelling, and no joint warmth.   Extremities:  no edema.  Neurologic:  alert & oriented X3, cranial nerves II-XII intact, and gait normal.    Diabetes Management Exam:    Foot Exam (with socks and/or shoes not present):       Sensory-Monofilament:          Left foot: abnormal          Right foot: abnormal   Impression & Recommendations:  Problem # 1:  BOILS, RECURRENT (ICD-680.9) Assessment Unchanged She has recurrent perianal abscess. Now it seem better, no abscess or drainage. Will give bactrim and recheck at next visit. Also needs better control of DM.   Problem # 2:   HYPERTENSION, BENIGN ESSENTIAL (ICD-401.1) Assessment: Deteriorated Her BP slightly above the goal and has been good in the past. Will not chnage her meds at this visit and recheck at next visit.  Her updated medication list for this problem includes:    Atenolol 100 Mg Tabs (Atenolol) .Marland Kitchen... Take 1 tablet by  mouth once a day    Lisinopril 20 Mg Tabs (Lisinopril) .Marland Kitchen... Take 1 tablet by mouth once a day    Hydrochlorothiazide 25 Mg Tabs (Hydrochlorothiazide) .Marland Kitchen... Take 1 tablet by mouth once a day  BP today: 144/88 Prior BP: 123/74 (08/20/2010)  Labs Reviewed: K+: 3.9 (02/07/2010) Creat: : 0.67 (02/07/2010)   Chol: 173 (02/07/2010)   HDL: 35 (02/07/2010)   LDL: 109 (02/07/2010)   TG: 145 (02/07/2010)  Problem # 3:  DM W/MANIFESTATION NEC, TYPE II, UNCONTROLLED (ICD-250.82) Assessment: Unchanged  Her DM still not well controlled. Have discussed with her about the plan to control her DM, including increase her lantus or add po meds. She said that her recent CBG has been better than before and would like to keep current insulin dose, and she wants to try exercise and weight loss. Her CBG has been better since change of her insulin a month ago. She refuses DM referral.  Her updated medication list for this problem includes:    Lisinopril 20 Mg Tabs (Lisinopril) .Marland Kitchen... Take 1 tablet by mouth once a day    Humalog Mix 75/25 Pen 75-25 % Susp (Insulin lispro prot & lispro) ..... Inject under the skin 110 units in the am and 55 units in the evening every day  Orders: T-Hgb A1C (in-house) (16109UE) T- Capillary Blood Glucose (45409)  Labs Reviewed: Creat: 0.67 (02/07/2010)     Last Eye Exam: No diabetic retinopathy.    (05/31/2008) Reviewed HgBA1c results: 10.6 (11/05/2010)  10.0 (08/20/2010)  Problem # 4:  TOBACCO ABUSE (ICD-305.1) Assessment: Comment Only  Current smoker. Encouraged smoking cessation and discussed different methods for smoking cessation.   Complete Medication List: 1)   Atenolol 100 Mg Tabs (Atenolol) .... Take 1 tablet by mouth once a day 2)  Lisinopril 20 Mg Tabs (Lisinopril) .... Take 1 tablet by mouth once a day 3)  Hydrochlorothiazide 25 Mg Tabs (Hydrochlorothiazide) .... Take 1 tablet by mouth once a day 4)  Advair Diskus 250-50 Mcg/dose Misc (Fluticasone-salmeterol) .... One puff two times a day 5)  Plavix 75 Mg Tabs (Clopidogrel bisulfate) .... Take 1 tablet by mouth once a day 6)  Ra Pen Needles 31g X 8 Mm Misc (Insulin pen needle) .... Use 3 times per day to check the sugar level 7)  Lyrica 100 Mg Caps (Pregabalin) .... Take 1 tablet by mouth three times a day 8)  Humalog Mix 75/25 Pen 75-25 % Susp (Insulin lispro prot & lispro) .... Inject under the skin 110 units in the am and 55 units in the evening every day 9)  Truetrack Test Strp (Glucose blood) .... Use to check blood sugar twice daily 10)  Lancets Misc (Lancets) .... Use to check blood sugar twice daily 11)  Nitrostat 0.4 Mg Subl (Nitroglycerin) .... Take one tablet under your tongue with severe chest pain, repeat in 5 minutes. attend to doctor/hospital if had to take more then 3 in 20 minutes 12)  Diflucan 100 Mg Tabs (Fluconazole) .... Take 1 tablet by mouth two times a day 13)  Lexapro 10 Mg Tabs (Escitalopram oxalate) .... Take 1 tablet by mouth once a day 14)  Hydrocodone-acetaminophen 5-325 Mg Tabs (Hydrocodone-acetaminophen) .... Take 1 tablet by mouth two times a day as needed for pain. 15)  Zoloft 25 Mg Tabs (Sertraline hcl) .... Take 1 tablet by mouth once a day 16)  Trazodone Hcl 100 Mg Tabs (Trazodone hcl) .... Take 1/2 tablet at bedtime as needed for insomnia 17)  Tramadol Hcl  50 Mg Tabs (Tramadol hcl) .... Take 1 tablet 2-3 times per day as needed for pain 18)  Bactrim Ds 800-160 Mg Tabs (Sulfamethoxazole-trimethoprim) .... Take 1 tablet by mouth two times a day for 7 days  Other Orders: T-PAP [facility]) [phone])  Patient Instructions: 1)  Please schedule a follow-up appointment  in 2-3 weeks. 2)  Please come to Clinic if no improvement or get worse.  3)  Tobacco is very bad for your health and your loved ones! You Should stop smoking!. 4)  Stop Smoking Tips: Choose a Quit date. Cut down before the Quit date. decide what you will do as a substitute when you feel the urge to smoke(gum,toothpick,exercise). 5)  It is important that you exercise regularly at least 20 minutes 5 times a week. If you develop chest pain, have severe difficulty breathing, or feel very tired , stop exercising immediately and seek medical attention. 6)  You need to lose weight. Consider a lower calorie diet and regular exercise.  Prescriptions: BACTRIM DS 800-160 MG TABS (SULFAMETHOXAZOLE-TRIMETHOPRIM) Take 1 tablet by mouth two times a day for 7 days  #14 x 0   Entered and Authorized by:   Jackson Latino MD   Signed by:   Jackson Latino MD on 11/05/2010   Method used:   Electronically to        [facility] (retail)       [address]       [place], [place]  [address]       Ph: [phone]       Fax: [phone]   RxID:   [id number]    Orders Added: 1)  T-Hgb A1C (in-house) [83036QW] 2)  T- Capillary Blood Glucose [82948] 3)  T-PAP [facility]) [88142] 4)  Est. Patient Level IV [44010]    Prevention & Chronic Care Immunizations   Influenza vaccine: Fluvax Non-MCR  (08/20/2010)   Influenza vaccine deferral: Not indicated  (04/01/2010)    Tetanus booster: Not documented   Td booster deferral: Not indicated  (10/25/2009)    Pneumococcal vaccine: Not documented  Colorectal Screening   Hemoccult: Not documented   Hemoccult action/deferral: Ordered  (11/05/2010)    Colonoscopy: Not documented   Colonoscopy action/deferral: Not indicated  (10/25/2009)  Other Screening   Pap smear: Normal  (08/17/2001)   Pap smear action/deferral: Not indicated S/P hysterectomy  (11/05/2010)    Mammogram: No specific mammographic evidence of malignancy.  No  significant changes compared to previous study.  Assessment: BIRADS 1.   (07/24/2010)   Mammogram action/deferral: Ordered  (06/28/2010)   Mammogram due: 07/2011   Smoking status: current  (11/05/2010)   Smoking cessation counseling: yes  (11/05/2010)  Diabetes Mellitus   HgbA1C: 10.6  (11/05/2010)   HgbA1C action/deferral: Ordered  (09/14/2009)    Eye exam: No diabetic retinopathy.     (05/31/2008)   Diabetic eye exam action/deferral: Ophthalmology referral  (06/28/2010)   Eye exam due: 05/2009    Foot exam: yes  (11/05/2010)   Foot exam action/deferral: Do today   High risk foot: Yes  (11/05/2010)   Foot care education: Done  (11/05/2010)   Foot exam due: 11/06/2011    Urine microalbumin/creatinine ratio: 9.4  (09/14/2009)   Urine microalbumin action/deferral: Ordered    Diabetes flowsheet reviewed?: Yes   Progress toward A1C goal: Improved  Lipids   Total Cholesterol: 173  (02/07/2010)   LDL: 109  (02/07/2010)   LDL Direct:  Not documented   HDL: 35  (02/07/2010)   Triglycerides: 145  (02/07/2010)    SGOT (AST): 37  (02/07/2010)   SGPT (ALT): 39  (02/07/2010)   Alkaline phosphatase: 85  (02/07/2010)   Total bilirubin: 0.3  (02/07/2010)    Lipid flowsheet reviewed?: Yes   Progress toward LDL goal: Unchanged  Hypertension   Last Blood Pressure: 144 / 88  (11/05/2010)   Serum creatinine: 0.67  (02/07/2010)   Serum potassium 3.9  (02/07/2010)    Hypertension flowsheet reviewed?: Yes   Progress toward BP goal: Deteriorated  Self-Management Support :   Personal Goals (by the next clinic visit) :     Personal A1C goal: 7  (09/14/2009)     Personal blood pressure goal: 130/80  (09/14/2009)     Personal LDL goal: 100  (09/14/2009)    Diabetes self-management support: Written self-care plan, Education handout, Resources for patients handout  (08/20/2010)   Last diabetes self-management training by diabetes educator: 06/03/2010    Hypertension self-management  support: Written self-care plan, Education handout, Resources for patients handout  (08/20/2010)    Lipid self-management support: Written self-care plan, Education handout, Resources for patients handout  (08/20/2010)    Nursing Instructions: Diabetic foot exam today Pap smear today    Laboratory Results   Blood Tests   Date/Time Received: November 05, 2010 9:12 AM  Date/Time Reported: Burke Keels  November 05, 2010 9:12 AM   HGBA1C: 10.6%   (Normal Range: Non-Diabetic - 3-6%   Control Diabetic - 6-8%) CBG Random:: 257mg /dL

## 2011-01-21 NOTE — Progress Notes (Signed)
Summary: refill/gg  Phone Note Refill Request  on August 07, 2010 4:00 PM  Refills Requested: Medication #1:  LISINOPRIL 20 MG TABS Take 1 tablet by mouth once a day ***[facility] can provide Accupril 10 mg 2 dialy at no charge   --will you change?  Initial call taken by: Merrie Roof RN,  August 07, 2010 4:00 PM  Follow-up for Phone Call        completed refill, thank you Modesta Sammons  Follow-up by: Mliss Sax MD,  August 07, 2010 7:36 PM    Prescriptions: LISINOPRIL 20 MG TABS (LISINOPRIL) Take 1 tablet by mouth once a day  #30 x 6   Entered and Authorized by:   Mliss Sax MD   Signed by:   Mliss Sax MD on 08/07/2010   Method used:   Faxed to ...       [facility] (retail)       [address] [place], [place]  [address]       Ph: [phone]       Fax: [phone]   RxID:   [id number] LISINOPRIL 20 MG TABS (LISINOPRIL) Take 1 tablet by mouth once a day  #30 x 6   Entered and Authorized by:   Mliss Sax MD   Signed by:   Mliss Sax MD on 08/07/2010   Method used:   Print then Give to Patient   RxID:   [id number]   Appended Document: refill/gg    Prescriptions: ACCUPRIL 20 MG TABS (QUINAPRIL HCL) Take 1 tablet by mouth once a day  #30 x 5   Entered and Authorized by:   Mliss Sax MD   Signed by:   Mliss Sax MD on 08/20/2010   Method used:   Electronically to        [facility] (retail)       [address]       [place], [place]  [address]       Ph: [phone]       Fax: [phone]   RxID:   [id number]

## 2011-01-21 NOTE — Consult Note (Signed)
[facility]   Imported By: Florinda Marker 08/08/2009 13:48:09  _____________________________________________________________________  External Attachment:    Type:   Image     Comment:   External Document

## 2011-01-21 NOTE — Letter (Signed)
[facility]   Imported By: Florinda Marker 11/06/2008 14:04:34  _____________________________________________________________________  External Attachment:    Type:   Image     Comment:   External Document

## 2011-01-21 NOTE — Progress Notes (Signed)
Summary: refill/ hla  Phone Note Refill Request Message from:  Patient on September 06, 2009 2:10 PM  Refills Requested: Medication #1:  NOVOLOG MIX 70/30 70-30 % SUSP 65 units subcutaneously every pm Initial call taken by: Marin Roberts RN,  September 06, 2009 2:10 PM  Follow-up for Phone Call        Rx faxed to pharmacy Follow-up by: Clydie Braun MD,  September 06, 2009 3:12 PM    Prescriptions: NOVOLOG MIX 70/30 70-30 % SUSP (INSULIN ASP PROT & ASP (HUM)) 65 units subcutaneously every pm  #71monthworth x 6   Entered and Authorized by:   Clydie Braun MD   Signed by:   Clydie Braun MD on 09/06/2009   Method used:   Faxed to ...       [facility] (retail)       [address] [place], [place]  [address]       Ph: [phone]       Fax: [phone]   RxID:   [id number]

## 2011-01-21 NOTE — Progress Notes (Signed)
Summary: refill/ hla  Phone Note Refill Request Message from:  Pharmacy on August 01, 2009 3:59 PM  Refills Requested: Medication #1:  ADVAIR DISKUS 250-50 MCG/DOSE MISC One puff two times a day  Medication #2:  NOVOLOG MIX 70/30 70-30 % SUSP 120 units Subcutaneously every am  Medication #3:  PLAVIX 75 MG  TABS Take 1 tablet by mouth once a day Initial call taken by: Marin Roberts RN,  August 01, 2009 3:59 PM  Follow-up for Phone Call        completed Follow-up by: Mliss Sax MD,  August 01, 2009 9:10 PM    Prescriptions: PLAVIX 75 MG  TABS (CLOPIDOGREL BISULFATE) Take 1 tablet by mouth once a day  #90 x 3   Entered and Authorized by:   Mliss Sax MD   Signed by:   Mliss Sax MD on 08/01/2009   Method used:   Faxed to ...       [facility] Department (retail)       [address] [place], [place]  [address]       Ph: [phone]       Fax: [phone]   RxID:   [id number] NOVOLOG MIX 70/30 70-30 % SUSP (INSULIN ASP PROT & ASP (HUM)) 120 units Subcutaneously every am  #3 month supp x 6   Entered and Authorized by:   Mliss Sax MD   Signed by:   Mliss Sax MD on 08/01/2009   Method used:   Faxed to ...       [facility] Department (retail)       [address] [place], [place]  [address]       Ph: [phone]       Fax: [phone]   RxID:   [id number] ADVAIR DISKUS 250-50 MCG/DOSE MISC (FLUTICASONE-SALMETEROL) One puff two times a day  #1 month x 11   Entered and Authorized by:   Mliss Sax MD   Signed by:   Mliss Sax MD on 08/01/2009   Method used:   Faxed to ...       [facility] Department (retail)       [address] [place], [place]  [address]       Ph: [phone]       Fax: [phone]   RxID:   [id number]

## 2011-01-21 NOTE — Assessment & Plan Note (Signed)
Summary: diabetes/dmr   Vital Signs:  Patient profile:   50 year old female Weight:      250.4 pounds BMI:     40.56 Is Patient Diabetic? Yes Did you bring your meter with you today? Yes   Allergies: 1)  ! Morphine Sulfate (Morphine Sulfate) 2)  ! * Iodinated Contrast   Complete Medication List: 1)  Atenolol 100 Mg Tabs (Atenolol) .... Take 1 tablet by mouth once a day 2)  Lisinopril 20 Mg Tabs (Lisinopril) .... Take 1 tablet by mouth once a day 3)  Hydrochlorothiazide 25 Mg Tabs (Hydrochlorothiazide) .... Take 1 tablet by mouth once a day 4)  Advair Diskus 250-50 Mcg/dose Misc (Fluticasone-salmeterol) .... One puff two times a day 5)  Tylenol Extra Strength 500 Mg Tabs (Acetaminophen) .... Take 1 tablet by mouth every 6 hours as needed for pain 6)  Plavix 75 Mg Tabs (Clopidogrel bisulfate) .... Take 1 tablet by mouth once a day 7)  Ra Pen Needles 31g X 8 Mm Misc (Insulin pen needle) .... Use 3 times per day to check the sugar level 8)  Amitriptyline Hcl 75 Mg Tabs (Amitriptyline hcl) .... Take 1 tablet by mouth two times a day 9)  Lyrica 100 Mg Caps (Pregabalin) .... Take 1 tablet by mouth three times a day 10)  Tramadol Hcl 50 Mg Tabs (Tramadol hcl) .... One tab up to four times a day for pain 11)  Humalog Mix 75/25 Pen 75-25 % Susp (Insulin lispro prot & lispro) .... Inject under the skin 110 units in the am and 55 units in the evening every day 12)  Truetrack Test Strp (Glucose blood) .... Use to check blood sugar twice daily 13)  Lancets Misc (Lancets) .... Use to check blood sugar twice daily 14)  Nitrostat 0.4 Mg Subl (Nitroglycerin) .... Take one tablet under your tongue with severe chest pain, repeat in 5 minutes. attend to doctor/hospital if had to take more then 3 in 20 minutes 15)  Lidoderm 5 % Ptch (Lidocaine) .... Apply to the affected area, 12 hours on and 12 hours off 16)  Trazodone Hcl 50 Mg Tabs (Trazodone hcl) .... Take 1 tab by mouth at bedtime as needed to fall  sleep. 17)  Metformin Hcl 1000 Mg Tabs (Metformin hcl) .... Take 1/2 tablet by mouth two times a day 18)  Diflucan 100 Mg Tabs (Fluconazole) .... Take 1 tablet by mouth two times a day  Other Orders: T-Hgb A1C (in-house) ([address]UE) MNT/Reassessment and Intervention, Individual, 15 Minutes [phone]) MEDICAL NUTRITION THERAPY  Assessment:Patient here with baby sister who she call "her little mother". Sister concerened about excessive regular soda consumption- > 2 liters a day. They have 2 sibling with addiction problems. Esti requsts cousneling today after discussion that her lack of diabetes self care is not knowledge based, but a symptom of other problems and that she needs someone to talk to who is objective. Patient says she wonders if her lack of self care is similar to her suicidal times in that she is intentionally trying to hurt herself.  She clarifies that she in no way is suicidal, is not depressed or unhappy.   Medications:skipping insulin often, says injections painful, but then after discussing thatnot taking insulin to intentionally decrease weth is considered an eating disorder:  she admits there may be more thatjust painful injections prevenmting her from taking her insulin correctly.  Blood sugars:see download- patient not self testing very much: 6x in past month 24 hour recall: >  2 leters regular soda per day Exercise:very little- requesting PT Labs:A1C higher today- patient desires to return on monthly basis aong with counseling to work on this.  Estimated needs: 200-300 grams carb/day  Diagnosis:  NB 1.3 -not ready for diet/lifestyle change as related to unidentified barriers as evidenced by patient request for counseling services.    Intervention:  1- Education about pen needles 2- Discussed carb needs of females and how to work in regular soda  3- Discussed addictive type behaviors and how this affects loved ones 4-coordination of care: sent request to her doctor and  socalil work for counseling services  Monitoring:ongoing counseling, taking insulin and self monitoring, ready to work on carb consumption Evaluation: a1c, weight  Follow-up:recommended monthly visits along with counseling  Patient Instructions: 1)  remember we talked about shorter pen needles- 4, 5 or 6 mm needles are shorter than wht you have. [employer] brand may be the best price. Shop around. 2)  WE will be in touch. about refferals      Laboratory Results   Blood Tests   Date/Time Received: May 08, 2010 3:31 PM Date/Time Reported: Burke Keels  May 08, 2010 3:31 PM   HGBA1C: 12.5%   (Normal Range: Non-Diabetic - 3-6%   Control Diabetic - 6-8%)      Appended Document: diabetes/dmr    Clinical Lists Changes  Problems: Assessed DEPRESSION as comment only -  Her updated medication list for this problem includes:    Amitriptyline Hcl 75 Mg Tabs (Amitriptyline hcl) .Marland Kitchen... Take 1 tablet by mouth two times a day    Trazodone Hcl 50 Mg Tabs (Trazodone hcl) .Marland Kitchen... Take 1 tab by mouth at bedtime as needed to fall sleep.  Orders: Psychiatric Referral (Psych)  Orders: Added new Referral order of Psychiatric Referral (Psych) - Signed       Impression & Recommendations:  Problem # 1:  DEPRESSION (ICD-311)  Her updated medication list for this problem includes:    Amitriptyline Hcl 75 Mg Tabs (Amitriptyline hcl) .Marland Kitchen... Take 1 tablet by mouth two times a day    Trazodone Hcl 50 Mg Tabs (Trazodone hcl) .Marland Kitchen... Take 1 tab by mouth at bedtime as needed to fall sleep.  Orders: Psychiatric Referral (Psych)

## 2011-01-21 NOTE — Progress Notes (Signed)
Summary: still trying to get Lyrica/dmr  Phone Note Call from Patient Call back at Home Phone [phone]   Caller: Patient Reason for Call: Lab or Test Results, Referral Summary of Call: they are messing me up still with my Lyrica- - can you have someone call me: they ([facility]) haven't mailed out my Lyrica yet. they say they need a another prescription, cover sheet and order form and doctor's name.  will forward to triage. Initial call taken by: [name] RD,CDE,  May 10, 2010 3:31 PM  Follow-up for Phone Call        called patient to let her know we are working on this- she said she had already spoken with someone her and at [employer] today. Follow-up by: [name] RD,CDE,  May 10, 2010 3:36 PM  Additional Follow-up for Phone Call Additional follow up Details #1::        I called [facility] this morning  [phone] and instructed to leave a message.  No one has called  back yet. Additional Follow-up by: Chinita Pester RN,  May 10, 2010 3:57 PM    Additional Follow-up for Phone Call Additional follow up Details #2::    I just faxed the script again now Follow-up by: Acey Lav MD,  May 10, 2010 4:11 PM

## 2011-01-21 NOTE — Assessment & Plan Note (Signed)
Summary: ROUTINE CK/EST/VS   Vital Signs:  Patient profile:   50 year old female Height:      66 inches Weight:      246.4 pounds BMI:     39.91 Temp:     98.1 degrees F oral Pulse rate:   77 / minute BP sitting:   119 / 82  (right arm)  Vitals Entered By: Filomena Jungling NT II (October 25, 2009 9:06 AM) CC:  follow-up visit Is Patient Diabetic? Yes Did you bring your meter with you today? Yes Pain Assessment Patient in pain? yes      Nutritional Status BMI of > 30 = obese CBG Result 329  Have you ever been in a relationship where you felt threatened, hurt or afraid?No   Does patient need assistance? Functional Status Self care Ambulation Normal   Primary Care Provider:  Mliss Sax MD  CC:   follow-up visit.  History of Present Illness: 50 yo female with HTN, uncontrolled diabetes, HLD, obesity comes in today for regular follow up appointment. She has several concerns: 1) She feels like her vision is geting worse and she might need new glasses but she has missed several appointments with an eye doctor so now she would like to see if we can reschedule and appointment. Her vision is worse at night and she can not read from up close or distance. 2) In addition, 2 months ago she had small corn on the left foot (little toe), she cut it her self and was referred to wound care for follow up. The wound heald well but now it seems like the toes is darker in color. It hurts slightly to touch, there is no erythema or swelling around it. She has decreased sensation at  the bottom of her feet but reports this is chronic problem for her and is not worsening.  3) Also, lately she has been having difficulty sleeping at night, tossing and turning frequently. She has tried tylenol PM and benadryl but it did not help. It bothers her since she feels more tired in the AM and then occasionally she forgets to take medicines during the day time since she falls asleep. She would like to have her  sleeping schedule changed to the way it was.   She denies chest pain, no palpitations, no cough, no recent sicknesses or hospitalizations. No abdominal or urinary concerns. Denies any changes in appetite, no changes in weight, no recent mood swings, no depressions or anxiety episodes. She ahs noticed any lumps or nodules on the body, denies myalgias or arthralgias. No recent traveling, no sick contacts or exposures.   Depression History:      The patient denies a depressed mood most of the day.  The patient denies significant weight loss, significant weight gain, insomnia, hypersomnia, psychomotor agitation, psychomotor retardation, fatigue (loss of energy), feelings of worthlessness (guilt), impaired concentration (indecisiveness), and recurrent thoughts of death or suicide.        The patient denies that she feels like life is not worth living, denies that she wishes that she were dead, and denies that she has thought about ending her life.         Preventive Screening-Counseling & Management  Alcohol-Tobacco     Alcohol drinks/day: 0     Smoking Status: current     Smoking Cessation Counseling: yes     Packs/Day: 1.0     Year Started: years     Passive Smoke Exposure: yes  Caffeine-Diet-Exercise  Does Patient Exercise: no  Problems Prior to Update: 1)  Candidiasis, Vaginal  (ICD-112.1) 2)  Hip Pain, Right  (ICD-719.45) 3)  Boils, Recurrent  (ICD-680.9) 4)  Diab W/neuro Manifests Type Ii/uns Not Uncntrl  (ICD-250.60) 5)  Depression  (ICD-311) 6)  Diabetic Foot Ulcer, Right  (ICD-250.80) 7)  Claudication  (ICD-443.9) 8)  Dm W/manifestation Nec, Type II, Uncontrolled  (ICD-250.82) 9)  Tobacco Abuse  (ICD-305.1) 10)  COPD  (ICD-496) 11)  Hyperlipidemia Nec/nos  (ICD-272.4) 12)  Transaminases, Serum, Elevated  (ICD-790.4) 13)  Diabetic Peripheral Neuropathy  (ICD-250.60) 14)  Hypertension, Benign Essential  (ICD-401.1) 15)  Knee Pain  (ICD-719.46) 16)  Fracture, Toe, Left   (ICD-826.0) 17)  Diabetic Foot Ulcer, Left  (ICD-250.80) 18)  Inadequate Material Resources  (ICD-V60.2) 19)  Nausea  (ICD-787.02) 20)  Weight Loss  (ICD-783.21) 21)  Dysphagia Unspecified  (ICD-787.20) 22)  Gastroesophageal Reflux Disease  (ICD-530.81) 23)  Ulcer of Other Part of Foot  (ICD-707.15) 24)  Other&unspecified Diseases The Oral Soft Tissues  (ICD-528.9) 25)  Abscess, Skin  (ICD-682.9) 26)  Bell's Palsy  (ICD-351.0) 27)  Back Pain  (ICD-724.5) 28)  Health Screening  (ICD-V70.0) 29)  Abscess, Perirectal, Hx of  (ICD-V13.3) 30)  Renal Failure, Acute, Hx of  (ICD-V13.09)  Medications Prior to Update: 1)  Atenolol 100 Mg Tabs (Atenolol) .... Take 1 Tablet By Mouth Once A Day 2)  Lisinopril 20 Mg Tabs (Lisinopril) .... Take 1 Tablet By Mouth Once A Day 3)  Hydrochlorothiazide 25 Mg Tabs (Hydrochlorothiazide) .... Take 1 Tablet By Mouth Once A Day 4)  Advair Diskus 250-50 Mcg/dose Misc (Fluticasone-Salmeterol) .... One Puff Two Times A Day 5)  Tylenol Extra Strength 500 Mg Tabs (Acetaminophen) .... Take 1 Tablet By Mouth Every 6 Hours As Needed For Pain 6)  Plavix 75 Mg  Tabs (Clopidogrel Bisulfate) .... Take 1 Tablet By Mouth Once A Day 7)  Ra Pen Needles 31g X 8 Mm Misc (Insulin Pen Needle) .... Use 3 Times Per Day To Check The Sugar Level 8)  Lyrica 50 Mg Caps (Pregabalin) .... Take 1 Capsule By Mouth Three Times A Day 9)  Amitriptyline Hcl 75 Mg Tabs (Amitriptyline Hcl) .... Take 1 Tablet By Mouth Two Times A Day 10)  Lyrica 100 Mg Caps (Pregabalin) .... Take 1 Tablet By Mouth Three Times A Day 11)  Tramadol Hcl 50 Mg Tabs (Tramadol Hcl) .... One Tab Up To Four Times A Day For Pain 12)  Doxycycline Hyclate 100 Mg Caps (Doxycycline Hyclate) .... Take 1 Tablet By Mouth Two Times A Day 13)  Paxil 20 Mg Tabs (Paroxetine Hcl) .... Take 1 Tablet By Mouth Once A Day 14)  Humalog Mix 75/25 Pen 75-25 % Susp (Insulin Lispro Prot & Lispro) .... Inject Under The Skin 110 Units in The Am  and 55 Units in The Evening Every Day 15)  Diflucan 150 Mg Tabs (Fluconazole) .... Take 1 Tablet Today and Then Stop  Current Medications (verified): 1)  Atenolol 100 Mg Tabs (Atenolol) .... Take 1 Tablet By Mouth Once A Day 2)  Lisinopril 20 Mg Tabs (Lisinopril) .... Take 1 Tablet By Mouth Once A Day 3)  Hydrochlorothiazide 25 Mg Tabs (Hydrochlorothiazide) .... Take 1 Tablet By Mouth Once A Day 4)  Advair Diskus 250-50 Mcg/dose Misc (Fluticasone-Salmeterol) .... One Puff Two Times A Day 5)  Tylenol Extra Strength 500 Mg Tabs (Acetaminophen) .... Take 1 Tablet By Mouth Every 6 Hours As Needed For Pain 6)  Plavix  75 Mg  Tabs (Clopidogrel Bisulfate) .... Take 1 Tablet By Mouth Once A Day 7)  Ra Pen Needles 31g X 8 Mm Misc (Insulin Pen Needle) .... Use 3 Times Per Day To Check The Sugar Level 8)  Lyrica 50 Mg Caps (Pregabalin) .... Take 1 Capsule By Mouth Three Times A Day 9)  Amitriptyline Hcl 75 Mg Tabs (Amitriptyline Hcl) .... Take 1 Tablet By Mouth Two Times A Day 10)  Lyrica 100 Mg Caps (Pregabalin) .... Take 1 Tablet By Mouth Three Times A Day 11)  Tramadol Hcl 50 Mg Tabs (Tramadol Hcl) .... One Tab Up To Four Times A Day For Pain 12)  Doxycycline Hyclate 100 Mg Caps (Doxycycline Hyclate) .... Take 1 Tablet By Mouth Two Times A Day 13)  Paxil 20 Mg Tabs (Paroxetine Hcl) .... Take 1 Tablet By Mouth Once A Day 14)  Humalog Mix 75/25 Pen 75-25 % Susp (Insulin Lispro Prot & Lispro) .... Inject Under The Skin 110 Units in The Am and 55 Units in The Evening Every Day 15)  Diflucan 150 Mg Tabs (Fluconazole) .... Take 1 Tablet Today and Then Stop  Allergies (verified): 1)  ! Morphine Sulfate (Morphine Sulfate) 2)  ! * Iodinated Contrast  Past History:  Past Medical History: Last updated: 11/24/2008 Current Problems:  RENAL FAILURE, ACUTE, HX OF (ICD-V13.09) TOBACCO ABUSE (ICD-305.1)     COPD (ICD-496) HYPERLIPIDEMIA NEC/NOS (ICD-272.4)     TRANSAMINASES, SERUM, ELEVATED  (ICD-790.4) Diabetes mellitus type II     DIABETIC PERIPHERAL NEUROPATHY (ICD-250.60)     DIABETIC FOOT ULCER, TOE (ICD-250.80) HYPERTENSION, BENIGN ESSENTIAL (ICD-401.1)  Past Surgical History: Last updated: 01/08/2007 PTCA/stent, left iliac 2005 R shoulder arthroscropy/debridement Hysterectomy, 1997 due to fibroids  Family History: Last updated: 06/22/2008 Mother  DM2, CHF, living Father asthma, living Brothers and sisters asthma since childhood  Social History: Last updated: 03/28/2009 Lives with daughter-in-law in apartment, currently unemployed  Risk Factors: Alcohol Use: 0 (10/25/2009) Exercise: no (10/25/2009)  Risk Factors: Smoking Status: current (10/25/2009) Packs/Day: 1.0 (10/25/2009) Passive Smoke Exposure: yes (10/25/2009)  Family History: Reviewed history from 06/22/2008 and no changes required. Mother  DM2, CHF, living Father asthma, living Brothers and sisters asthma since childhood  Social History: Reviewed history from 03/28/2009 and no changes required. Lives with daughter-in-law in apartment, currently unemployed  Review of Systems       per HPI  Physical Exam  General:  Well-developed,well-nourished,in no acute distress; alert,appropriate and cooperative throughout examination Neck:  No deformities, masses, or tenderness noted. Lungs:  Normal respiratory effort, chest expands symmetrically. Lungs are clear to auscultation, no crackles or wheezes. Heart:  Normal rate and regular rhythm. S1 and S2 normal without gallop, murmur, click, rub or other extra sounds. Abdomen:  Bowel sounds positive,abdomen soft and non-tender without masses, organomegaly or hernias noted. Msk:  left foot little toe with area of swelling surrounding it and tenderness to deep palpations, no joint instability or deformity Extremities:  1+ left pedal edema and 1+ right pedal edema.   Neurologic:  alert & oriented X3, cranial nerves II-XII intact, and strength normal in  all extremities.   Skin:  turgor normal.   Psych:  Cognition and judgment appear intact. Alert and cooperative with normal attention span and concentration. No apparent delusions, illusions, hallucinations  Diabetes Management Exam:    Foot Exam (with socks and/or shoes not present):       Sensory-Pinprick/Light touch:          Left medial foot (L-4): diminished  Left dorsal foot (L-5): diminished          Left lateral foot (S-1): diminished          Right medial foot (L-4): diminished          Right dorsal foot (L-5): diminished          Right lateral foot (S-1): diminished       Sensory-Monofilament:          Left foot: diminished          Right foot: diminished       Inspection:          Left foot: abnormal          Right foot: abnormal       Nails:          Left foot: thickened          Right foot: thickened   Impression & Recommendations:  Problem # 1:  DM W/MANIFESTATION NEC, TYPE II, UNCONTROLLED (ICD-250.82) Slight A1C trending up noted. Review of  glucose monitor shows that patient has not been very compliant with regular sugar checks, only 11 reading in one month period. Only 27% of readings are within target range with average glucose reading 246, no hypoglycemic events, lowest reading 145 and the highest one 332. I have examined pt's feet today and she she has diminished senasation in both feet. I have explained to the patient the importance of daily feet check ups for new sores or wounds, I have also discussed with the patient importance of compliance.   Her updated medication list for this problem includes:    Humalog Mix 75/25 Pen 75-25 % Susp..... Inject under the skin 110 units in the am and 55 units in the evening every day  Labs Reviewed: Creat: 0.90 (09/14/2009)     Last Eye Exam: No diabetic retinopathy.    (05/31/2008) Reviewed HgBA1c results: 10.2 (10/25/2009)  9.8 (09/14/2009)  Her updated medication list for this problem includes:     Lisinopril 20 Mg Tabs (Lisinopril) .Marland Kitchen... Take 1 tablet by mouth once a day    Humalog Mix 75/25 Pen 75-25 % Susp (Insulin lispro prot & lispro) ..... Inject under the skin 110 units in the am and 55 units in the evening every day  Orders: Ophthalmology Referral (Ophthalmology) Wound Care Center Referral (Wound Care)  Problem # 2:  HYPERLIPIDEMIA NEC/NOS (ICD-272.4) Pt did not want to have FLP checked today but we will get it on her next visit and will adjust the regimen as needed. Trending down is noted however, LDL 122 --> 79, Chol 183 --> 171.   Labs Reviewed: SGOT: 65 (09/14/2009)   SGPT: 65 (09/14/2009)   HDL:30 (11/24/2008), 31 (06/22/2008)  LDL:79 (11/24/2008), 122 (16/09/9603)  Chol:171 (11/24/2008), 183 (06/22/2008)  Trig:310 (11/24/2008), 150 (06/22/2008)  Problem # 3:  HYPERTENSION, BENIGN ESSENTIAL (ICD-401.1) Good control, continue the same regimen.  Her updated medication list for this problem includes:    Atenolol 100 Mg Tabs (Atenolol) .Marland Kitchen... Take 1 tablet by mouth once a day    Lisinopril 20 Mg Tabs (Lisinopril) .Marland Kitchen... Take 1 tablet by mouth once a day    Hydrochlorothiazide 25 Mg Tabs (Hydrochlorothiazide) .Marland Kitchen... Take 1 tablet by mouth once a day  BP today: 119/82 Prior BP: 100/80 (09/14/2009)  Labs Reviewed: K+: 3.9 (09/14/2009) Creat: : 0.90 (09/14/2009)   Chol: 171 (11/24/2008)   HDL: 30 (11/24/2008)   LDL: 79 (11/24/2008)   TG: 310 (11/24/2008)  Problem # 4:  DIABETIC FOOT ULCER, LEFT (ICD-250.80) Pt had foot ulcer on the left foot, little toe, she has received proper wound care and wound healed well. Now she reports that it seems like the skin around that area is getting darker. She still has diminished sensation in both feet but this is chronic problem for her and in addition her pulses are diminished bilaterally and this is also chronic issue with her. She is at high risk for developing diabetic ulcers with complications so it is important we can track of this new  skin darkening on her left little toe. I will get her into the wound care so that she can continue the care and in addition I have advised her to keep an eye on it so that in case she notices more drastic color change she needs to come in ASAP for further evaluation. Her updated medication list for this problem includes:    Humalog Mix 75/25 Pen 75-25 % Susp (Insulin lispro prot & lispro) ..... Inject under the skin 110 units in the am and 55 units in the evening every day  Labs Reviewed: Creat: 0.90 (09/14/2009)     Last Eye Exam: No diabetic retinopathy.    (05/31/2008) Reviewed HgBA1c results: 10.2 (10/25/2009)  9.8 (09/14/2009)  Problem # 5:  INSOMNIA (ICD-780.52) Started recenlty and pt did not tolerate benadryl well. Will try ambien for now.  Her updated medication list for this problem includes:    Ambien 10 Mg Tabs (Zolpidem tartrate) .Marland Kitchen... Take 1 tablet once at bedtime as needed for insomnia  Problem # 6:  DEPRESSION (ICD-311) Will increase Paxil to 40 mg once daily, pt reports feeling more depressed due to multiple medical conditions and now home situation is worse since she is taking care of her granddaughter, she has difficult concentrating on certain issues and feels tired all the time.  Her updated medication list for this problem includes:    Amitriptyline Hcl 75 Mg Tabs (Amitriptyline hcl) .Marland Kitchen... Take 1 tablet by mouth two times a day    Paxil 40 Mg Tabs (Paroxetine hcl) .Marland Kitchen... Take 1 tablet once daily for depression and anxiety  Complete Medication List: 1)  Atenolol 100 Mg Tabs (Atenolol) .... Take 1 tablet by mouth once a day 2)  Lisinopril 20 Mg Tabs (Lisinopril) .... Take 1 tablet by mouth once a day 3)  Hydrochlorothiazide 25 Mg Tabs (Hydrochlorothiazide) .... Take 1 tablet by mouth once a day 4)  Advair Diskus 250-50 Mcg/dose Misc (Fluticasone-salmeterol) .... One puff two times a day 5)  Tylenol Extra Strength 500 Mg Tabs (Acetaminophen) .... Take 1 tablet by mouth  every 6 hours as needed for pain 6)  Plavix 75 Mg Tabs (Clopidogrel bisulfate) .... Take 1 tablet by mouth once a day 7)  Ra Pen Needles 31g X 8 Mm Misc (Insulin pen needle) .... Use 3 times per day to check the sugar level 8)  Lyrica 50 Mg Caps (Pregabalin) .... Take 1 capsule by mouth three times a day 9)  Amitriptyline Hcl 75 Mg Tabs (Amitriptyline hcl) .... Take 1 tablet by mouth two times a day 10)  Lyrica 100 Mg Caps (Pregabalin) .... Take 1 tablet by mouth three times a day 11)  Tramadol Hcl 50 Mg Tabs (Tramadol hcl) .... One tab up to four times a day for pain 12)  Doxycycline Hyclate 100 Mg Caps (Doxycycline hyclate) .... Take 1 tablet by mouth two times a day 13)  Paxil 40 Mg Tabs (Paroxetine hcl) .... Take 1  tablet once daily for depression and anxiety 14)  Humalog Mix 75/25 Pen 75-25 % Susp (Insulin lispro prot & lispro) .... Inject under the skin 110 units in the am and 55 units in the evening every day 15)  Diflucan 150 Mg Tabs (Fluconazole) .... Take 1 tablet today and then stop 16)  Ambien 10 Mg Tabs (Zolpidem tartrate) .... Take 1 tablet once at bedtime as needed for insomnia  Other Orders: T- Capillary Blood Glucose (16109) T-Hgb A1C (in-house) (60454UJ) Mammogram (Screening) (Mammo)  Patient Instructions: 1)  Check your blood sugars regularly. If your readings are usually above 350 : or below 70 you should contact our office. 2)  It is important that your Diabetic A1c level is checked every 3 months. 3)  See your eye doctor yearly to check for diabetic eye damage. 4)  Check your feet each night for sore areas, calluses or signs of infection. 5)  Check your Blood Pressure regularly. If it is above 170: you should make an appointment. Prescriptions: PAXIL 40 MG TABS (PAROXETINE HCL) Take 1 tablet once daily for depression and anxiety  #0 x 0   Entered and Authorized by:   Mliss Sax MD   Signed by:   Mliss Sax MD on 10/25/2009   Method used:   Print then Give to  Patient   RxID:   [id number] AMBIEN 10 MG TABS (ZOLPIDEM TARTRATE) take 1 tablet once at bedtime as needed for insomnia  #30 x 3   Entered and Authorized by:   Mliss Sax MD   Signed by:   Mliss Sax MD on 10/25/2009   Method used:   Print then Give to Patient   RxID:   [id number]   Prevention & Chronic Care Immunizations   Influenza vaccine: Not documented   Influenza vaccine deferral: Refused  (09/14/2009)    Tetanus booster: Not documented   Td booster deferral: Not indicated  (10/25/2009)    Pneumococcal vaccine: Not documented  Colorectal Screening   Hemoccult: Not documented   Hemoccult action/deferral: Not indicated  (10/25/2009)    Colonoscopy: Not documented   Colonoscopy action/deferral: Not indicated  (10/25/2009)  Other Screening   Pap smear: Normal  (08/17/2001)   Pap smear action/deferral: Deferred-3 yr interval  (10/25/2009)    Mammogram: No specific mammographic evidence of malignancy.  Assessment: BIRADS 1.   (04/11/2008)   Mammogram action/deferral: Ordered  (10/25/2009)   Mammogram due: 11/09/2009   Smoking status: current  (10/25/2009)   Smoking cessation counseling: yes  (10/25/2009)  Diabetes Mellitus   HgbA1C: 10.2  (10/25/2009)   HgbA1C action/deferral: Ordered  (09/14/2009)    Eye exam: No diabetic retinopathy.     (05/31/2008)   Eye exam due: 05/2009    Foot exam: yes  (10/25/2009)   High risk foot: Not documented   Foot care education: Not documented    Urine microalbumin/creatinine ratio: 9.4  (09/14/2009)   Urine microalbumin action/deferral: Ordered    Diabetes flowsheet reviewed?: Yes   Progress toward A1C goal: Unchanged  Lipids   Total Cholesterol: 171  (11/24/2008)   LDL: 79  (11/24/2008)   LDL Direct: Not documented   HDL: 30  (11/24/2008)   Triglycerides: 310  (11/24/2008)    SGOT (AST): 65  (09/14/2009)   SGPT (ALT): 65  (09/14/2009)   Alkaline phosphatase: 93  (09/14/2009)   Total bilirubin: 0.3   (09/14/2009)    Lipid flowsheet reviewed?: Yes   Progress toward LDL goal: Improved  Hypertension   Last Blood  Pressure: 119 / 82  (10/25/2009)   Serum creatinine: 0.90  (09/14/2009)   Serum potassium 3.9  (09/14/2009)    Hypertension flowsheet reviewed?: Yes   Progress toward BP goal: Improved  Self-Management Support :   Personal Goals (by the next clinic visit) :     Personal A1C goal: 7  (09/14/2009)     Personal blood pressure goal: 130/80  (09/14/2009)     Personal LDL goal: 100  (09/14/2009)    Diabetes self-management support: Written self-care plan  (09/14/2009)   Last diabetes self-management training by diabetes educator: 10/10/2009    Hypertension self-management support: Written self-care plan  (09/14/2009)    Lipid self-management support: Written self-care plan  (09/14/2009)    Nursing Instructions: Schedule screening mammogram (see order)     Laboratory Results   Blood Tests   Date/Time Received: October 25, 2009 9:36 AM Date/Time Reported: Burke Keels  October 25, 2009 9:36 AM  HGBA1C: 10.2%   (Normal Range: Non-Diabetic - 3-6%   Control Diabetic - 6-8%) CBG Random:: 329mg /dL

## 2011-01-21 NOTE — Progress Notes (Signed)
  Phone Note Outgoing Call   Call placed by: Mliss Sax MD,  June 21, 2009 3:28 PM Call placed to: Patient Summary of Call: Attempted to call patient at home again to resolve the lyrica issue but I was not successful getting in touch with her. I left the message on her phone to call us back. I called [phone]. Thank you. Zelpha Messing

## 2011-01-21 NOTE — Miscellaneous (Signed)
  Clinical Lists Changes  Medications: Rx of ADVAIR DISKUS 250-50 MCG/DOSE MISC (FLUTICASONE-SALMETEROL) One puff two times a day;  #30 days x 11;  Signed;  Entered by: Ulyess Mort MD;  Authorized by: Ulyess Mort MD;  Method used: Print then Give to Patient    Prescriptions: ADVAIR DISKUS 250-50 MCG/DOSE MISC (FLUTICASONE-SALMETEROL) One puff two times a day  #30 days x 11   Entered and Authorized by:   Ulyess Mort MD   Signed by:   Ulyess Mort MD on 11/01/2009   Method used:   Print then Give to Patient   RxID:   [id number]

## 2011-01-21 NOTE — Progress Notes (Signed)
Summary: walk-in  Call back at walk-in   Call placed by: Marin Roberts RN,  April 13, 2007 10:14 AM Summary of Call: pt presented c/o hip pain wanting to be seen, appt offered for this pm, pt refused, suggested urgent care, refused, walked away cursing and stating she would go to er Initial call taken by: Marin Roberts RN,  April 13, 2007 10:18 AM

## 2011-01-21 NOTE — Miscellaneous (Signed)
Summary: Social Work/Food Pantry Referral   Social Work Evaluation Date  04/26/2008 Patient name Jennifer [name]  Primary MD   : Peggye Pitt MD Social Worker's name : Dorothe Pea MSW- LCSW  Home [phone]    Cell phone: .  Marland Kitchen     Alternate phone: . Marland Kitchen       Individual making referral: Patient  Primary Reason for Referral:     Financial Crisis/Counseling and referral for food,clothes,meals,emergency funds Comments Patient expresses need for food.  Out of work and in process of filing for disability.  Action taken by Social Work: Bag of groceries left at front desk for pick up by father.   Food pantry referral mailed to pt. address so she can access Theatre stage [employer].

## 2011-01-21 NOTE — Assessment & Plan Note (Signed)
Summary: CLASS/SB.   Allergies: 1)  ! Morphine Sulfate (Morphine Sulfate) 2)  ! * Iodinated Contrast   Complete Medication List: 1)  Atenolol 100 Mg Tabs (Atenolol) .... Take 1 tablet by mouth once a day 2)  Lisinopril 20 Mg Tabs (Lisinopril) .... Take 1 tablet by mouth once a day 3)  Hydrochlorothiazide 25 Mg Tabs (Hydrochlorothiazide) .... Take 1 tablet by mouth once a day 4)  Advair Diskus 250-50 Mcg/dose Misc (Fluticasone-salmeterol) .... One puff two times a day 5)  Tylenol Extra Strength 500 Mg Tabs (Acetaminophen) .... Take 1 tablet by mouth every 6 hours as needed for pain 6)  Plavix 75 Mg Tabs (Clopidogrel bisulfate) .... Take 1 tablet by mouth once a day 7)  Ra Pen Needles 31g X 8 Mm Misc (Insulin pen needle) .... Use 3 times per day to check the sugar level 8)  Amitriptyline Hcl 75 Mg Tabs (Amitriptyline hcl) .... Take 1 tablet by mouth two times a day 9)  Lyrica 100 Mg Caps (Pregabalin) .... Take 1 tablet by mouth three times a day 10)  Tramadol Hcl 50 Mg Tabs (Tramadol hcl) .... One tab up to four times a day for pain 11)  Humalog Mix 75/25 Pen 75-25 % Susp (Insulin lispro prot & lispro) .... Inject under the skin 110 units in the am and 55 units in the evening every day 12)  Truetrack Test Strp (Glucose blood) .... Use to check blood sugar twice daily 13)  Lancets Misc (Lancets) .... Use to check blood sugar twice daily 14)  Nitrostat 0.4 Mg Subl (Nitroglycerin) .... Take one tablet under your tongue with severe chest pain, repeat in 5 minutes. attend to doctor/hospital if had to take more then 3 in 20 minutes 15)  Lidoderm 5 % Ptch (Lidocaine) .... Apply to the affected area, 12 hours on and 12 hours off 16)  Trazodone Hcl 50 Mg Tabs (Trazodone hcl) .... Take 1 tab by mouth at bedtime as needed to fall sleep. 17)  Metformin Hcl 1000 Mg Tabs (Metformin hcl) .... Take 1/2 tablet by mouth two times a day 18)  Diflucan 100 Mg Tabs (Fluconazole) .... Take 1 tablet by mouth two  times a day  Other Orders: DSMT(Medicare) Individual, 30 Minutes (W4132)  Diabetes Self Management Training  PCP: Mliss Sax MD Referring MD: B.Crenshaw Date diagnosed with diabetes: 09/21/1992 Diabetes Type: Type 2 insulin Other persons present: granddaughter Current smoking Status: current  Diabetes Medications:  Comments: Continues to work on taking insulin twice daily, always takes am injection, takes evening injection most of the time. recently moved and had misplaced meter- new True Track meter given to patient today. Discussed [facility] Reli On Ultima meter if unable to continue to obtain testing supplies from [facility]     Diabetes Management Education Done: 06/03/2010    BEHAVIORAL GOAL FOLLOW UP Utilizing medications if for therapeutic effectiveness: Most of the time(85%) Specific goal set today: plans to write note to herself and post it in her bathroom to remind her to take her evening injection. also planning on setting alarm and wake up take insulin , eat , then go back to bed to try to take it on schedule better.Encouraged patient that she was caring for herslef with her well thought out planning to resolve her own problems.      Diabetes Self Management Support: niece, family, clinic staff Follow-up:4-6 weeks

## 2011-01-21 NOTE — Progress Notes (Signed)
Summary: Medication change  Phone Note Call from Patient   Summary of Call: Pt here says that she cannot afford to get the prescriptionn filled for the Restoril 7.5 mg # 30.  Pt would like for this to be changed to something else that she can afford. Angelina Ok RN  March 01, 2010 3:02 PM  Initial call taken by: Angelina Ok RN,  March 01, 2010 3:02 PM  Follow-up for Phone Call        We can try trazodone instead;and see what happen.    New/Updated Medications: TRAZODONE HCL 50 MG TABS (TRAZODONE HCL) Take 1 tab by mouth at bedtime as needed to fall sleep. Prescriptions: TRAZODONE HCL 50 MG TABS (TRAZODONE HCL) Take 1 tab by mouth at bedtime as needed to fall sleep.  #30 x 1   Entered and Authorized by:   Vassie Loll MD   Signed by:   Vassie Loll MD on 03/01/2010   Method used:   Telephoned to ...       [facility] (retail)       [address] [place], [place]  [address]       Ph: [phone]       Fax: [phone]   RxID:   [id number]   Appended Document: Medication change Prescription called to the [facility] per order of Dr. Gwenlyn Perking. Angelina Ok, RN March 01, 2010. 3:29 PM

## 2011-01-21 NOTE — Letter (Signed)
Summary: ABC Sheet  ABC Sheet   Imported By: Florinda Marker 05/28/2009 16:12:44  _____________________________________________________________________  External Attachment:    Type:   Image     Comment:   External Document

## 2011-01-21 NOTE — Progress Notes (Signed)
Summary: Project Access  Phone Note Other Incoming   Call placed by: Shon Hough,  April 20, 2007 3:48 PM Summary of Call: Rec'd message that pt has been approved through [employer]. Please feel free to contact Doran Stabler with questions at [phone] ext [phone]. Initial call taken by: Shon Hough,  April 20, 2007 3:51 PM

## 2011-01-21 NOTE — Consult Note (Signed)
Summary: [facility]  [facility]   Imported By: Louretta Parma 10/10/2010 14:59:23  _____________________________________________________________________  External Attachment:    Type:   Image     Comment:   External Document

## 2011-01-21 NOTE — Progress Notes (Signed)
Summary: refill/gg  Phone Note Refill Request   Refills Requested: Medication #1:  LEXAPRO 10 MG TABS Take 1 tablet by mouth once a day   Last Refilled: 08/05/2010  Method Requested: Fax to Local Pharmacy Initial call taken by: Merrie Roof RN,  August 28, 2010 5:06 PM  Follow-up for Phone Call        completed refill, thank you Georgianna Band  Follow-up by: Mliss Sax MD,  August 29, 2010 10:06 AM    Prescriptions: LEXAPRO 10 MG TABS (ESCITALOPRAM OXALATE) Take 1 tablet by mouth once a day  #30 x 5   Entered and Authorized by:   Mliss Sax MD   Signed by:   Mliss Sax MD on 08/29/2010   Method used:   Faxed to ...       [facility] (retail)       [address]       [place], [place]  [address]       Ph: [phone]       Fax: [phone]   RxID:   [id number]

## 2011-01-21 NOTE — Consult Note (Signed)
Summary: Vascular&Vein:Dr. Hart Rochester  Vascular&Vein:Dr. Hart Rochester   Imported By: Florinda Marker 11/30/2007 14:58:04  _____________________________________________________________________  External Attachment:    Type:   Image     Comment:   External Document

## 2011-01-21 NOTE — Assessment & Plan Note (Signed)
Summary: FU OV/NOT HFU/PER DR [name]/VS   Vital Signs:  Patient Profile:   50 Years Old Female Height:     66 inches (167.64 cm) Weight:      247.04 pounds BMI:     40.02 Temp:     97 degrees F oral Pulse rate:   66 / minute BP sitting:   165 / 89  (right arm)  Vitals Entered By: Angelina Ok RN (September 29, 2007 3:18 PM)             Is Patient Diabetic? Yes  Nutritional Status BMI of > 30 = obese CBG Result 126  Have you ever been in a relationship where you felt threatened, hurt or afraid?No   Does patient need assistance? Functional Status Self care Ambulation Normal     PCP:  Peggye Pitt MD  Chief Complaint:  Check up.  History of Present Illness: Here for f/u on her LP and vasculitic w/u. Everything was normal,no evidence of vasculitis or multiple sclerosis. Her CBGs are better controlled 130-190). Will see vascular surgeon later this month for claudication with an ABI of 0.74 in her right leg.  Current Allergies: ! MORPHINE SULFATE (MORPHINE SULFATE) ! * IODINATED CONTRAST     Review of Systems  The patient denies fever, weight loss, chest pain, peripheral edema, and abdominal pain.     Physical Exam  General:     Well-developed,well-nourished,in no acute distress; alert,appropriate and cooperative throughout examination Lungs:     Normal respiratory effort, chest expands symmetrically. Lungs are clear to auscultation, no crackles or wheezes. Heart:     Normal rate and regular rhythm. S1 and S2 normal without gallop, murmur, click, rub or other extra sounds.    Impression & Recommendations:  Problem # 1:  CLAUDICATION (ICD-443.9) For vascular surgery later this month.  Problem # 2:  HYPERLIPIDEMIA NEC/NOS (ICD-272.4) Will restart her statin today, given LDL not at goal for diabetic. Her LFTs are back to normal.  Her updated medication list for this problem includes:    Pravachol 20 Mg Tabs (Pravastatin sodium) .Marland Kitchen... Take 1 tab by  mouth at bedtime  Labs Reviewed: Chol: 165 (09/20/2007)   HDL: 45 (09/20/2007)   LDL: 110 (09/20/2007)   TG: 52 (09/20/2007) SGOT: 20 (09/13/2007)   SGPT: 27 (09/13/2007)   Problem # 3:  DM W/MANIFESTATION NEC, TYPE II, UNCONTROLLED (ICD-250.82) Now that she is back on insulin her CBGs are looking better. Will not make any adjustments today. Will return in 1 month for followup. Microalbumin today.  Her updated medication list for this problem includes:    Lisinopril 20 Mg Tabs (Lisinopril) .Marland Kitchen... Take 1 tablet by mouth once a day    Novolog Mix 70/30 70-30 % Susp (Insulin asp prot & asp (hum)) .Marland KitchenMarland KitchenMarland KitchenMarland Kitchen 100 units subcutaneously every am    Novolog Mix 70/30 70-30 % Susp (Insulin asp prot & asp (hum)) .Marland KitchenMarland KitchenMarland KitchenMarland Kitchen 50 units subcutaneously every pm  Orders: T-Urine Microalbumin w/creat. ratio [phone] / [id number])  Labs Reviewed: HgBA1c: 8.2 (08/17/2007)   Creat: 0.61 (09/13/2007)      Problem # 4:  TOBACCO ABUSE (ICD-305.1) Have provided information on smoking cessation. Will set quit date and prescribe chantix at next visit in 1 month.  Problem # 5:  HYPERTENSION, BENIGN ESSENTIAL (ICD-401.1) BP elevated today; however, she has not taken her medications today.  Her updated medication list for this problem includes:    Atenolol 100 Mg Tabs (Atenolol) .Marland Kitchen... Take 1 tablet by mouth once a  day    Lisinopril 20 Mg Tabs (Lisinopril) .Marland Kitchen... Take 1 tablet by mouth once a day    Hydrochlorothiazide 25 Mg Tabs (Hydrochlorothiazide) .Marland Kitchen... Take 1 tablet by mouth once a day  BP today: 165/89 Prior BP: 151/74 (09/13/2007)  Labs Reviewed: Creat: 0.61 (09/13/2007) Chol: 165 (09/20/2007)   HDL: 45 (09/20/2007)   LDL: 110 (09/20/2007)   TG: 52 (09/20/2007)   Complete Medication List: 1)  Atenolol 100 Mg Tabs (Atenolol) .... Take 1 tablet by mouth once a day 2)  Lisinopril 20 Mg Tabs (Lisinopril) .... Take 1 tablet by mouth once a day 3)  Hydrochlorothiazide 25 Mg Tabs (Hydrochlorothiazide) .... Take 1  tablet by mouth once a day 4)  Lyrica 100 Mg Caps (Pregabalin) .... Take 1 tablet by mouth three times a day 5)  Advair Diskus 250-50 Mcg/dose Misc (Fluticasone-salmeterol) .... One puff two times a day 6)  Albuterol 90 Mcg/act Aers (Albuterol) .... One puff every 6 hrs as needed for wheezing 7)  Novolog Mix 70/30 70-30 % Susp (Insulin asp prot & asp (hum)) .Marland Kitchen.. 100 units subcutaneously every am 8)  Novolog Mix 70/30 70-30 % Susp (Insulin asp prot & asp (hum)) .... 50 units subcutaneously every pm 9)  Naprosyn 500 Mg Tabs (Naproxen) .... Take 1 tablet by mouth two times a day 10)  Tylenol Extra Strength 500 Mg Tabs (Acetaminophen) .... Take 1 tablet by mouth every 6 hours as needed for pain 11)  Ibuprofen 600 Mg Tabs (Ibuprofen) .... Take 1 tablet every 8 hours as needed for pain. 12)  Pravachol 20 Mg Tabs (Pravastatin sodium) .... Take 1 tab by mouth at bedtime  Other Orders: Capillary Blood Glucose (30160) Fingerstick (10932)   Patient Instructions: 1)  Please schedule a follow-up appointment in 1 month.    Prescriptions: PRAVACHOL 20 MG  TABS (PRAVASTATIN SODIUM) Take 1 tab by mouth at bedtime  #31 x 5   Entered and Authorized by:   Peggye Pitt MD   Signed by:   Peggye Pitt MD on 09/29/2007   Method used:   Print then Give to Patient   RxID:   [id number]  ] Last LDL:                                                 110 (09/20/2007 7:15:00 PM)

## 2011-01-21 NOTE — Assessment & Plan Note (Signed)
Summary: Soc. Work   Social Work Evaluation Date  12/25/2009 Patient name [name]  Primary MD   : Mliss Sax MD Social Worker's name : Dorothe Pea MSW- LCSW  Home [phone]    Cell phone: .  Marland Kitchen     Alternate phone: . Marland Kitchen       Individual making referral: physician/CDE  Primary Reason for Referral:     Emergency Housing/Public Housing/Section 8 Comments Joanny comes in today and tells me her niece has given her less than 15 days to get out of the house and find housing.   Sukhmani's income is $941 with $141.15 taken out for back taxes.   She receives very little in foodstamps ($16 per month) and did not reapply.      Action taken by Social Work: Tinnie Gens originally made this appmt with me for smoking cessation but right now we are unable to address smoking cessation due to housing crisis.    Most of our session today was spent researching housing options including [facility], [facility], Sec 8 housing, [employer].  I also printed off a list of affordable housing options from the nchousing website.  Tansy's cousin is going to help her with transportation in her housing search.   Otherwise, Maday utilizes the bus system.   Called April at [facility] who will send application to get Sabryna on the list for lower income housing.   SW follow-up.  Lyna Laningham another one of my cards and encouraged her to call me and let me know how she does.

## 2011-01-21 NOTE — Assessment & Plan Note (Signed)
Summary: EST-CK/FU/MEDS/CFB   Vital Signs:  Patient Profile:   50 Years Old Female Height:     66 inches (167.64 cm) Weight:      254.7 pounds BMI:     41.26 Temp:     97.1 degrees F oral Pulse rate:   67 / minute BP sitting:   124 / 76  (left arm) Cuff size:   large  Vitals Entered By: Theotis Barrio (January 24, 2008 3:56 PM)                 PCP:  Peggye Pitt MD   History of Present Illness: Here for a scheduled followup appointment. Her lower back continues to bother her. Her previous MRI was c/w facet arthropathy and I had referred her to the pain clinic...they have not seen her yet because she needed to recertify with [facility]...that has been taken care of. She is requesting refills of all her medications, she gets most of them through patient assistance with [name]. No other complaints.   Prior Medications :  ATENOLOL 100 MG TABS (ATENOLOL) Take 1 tablet by mouth once a day LISINOPRIL 20 MG TABS (LISINOPRIL) Take 1 tablet by mouth once a day HYDROCHLOROTHIAZIDE 25 MG TABS (HYDROCHLOROTHIAZIDE) Take 1 tablet by mouth once a day LYRICA 100 MG CAPS (PREGABALIN) Take 1 tablet by mouth three times a day ADVAIR DISKUS 250-50 MCG/DOSE MISC (FLUTICASONE-SALMETEROL) One puff two times a day ALBUTEROL 90 MCG/ACT AERS (ALBUTEROL) one puff every 6 hrs as needed for wheezing NOVOLOG MIX 70/30 70-30 % SUSP (INSULIN ASP PROT & ASP (HUM)) 100 units Subcutaneously every am NOVOLOG MIX 70/30 70-30 % SUSP (INSULIN ASP PROT & ASP (HUM)) 50 units subcutaneously every pm NAPROSYN 500 MG TABS (NAPROXEN) Take 1 tablet by mouth two times a day TYLENOL EXTRA STRENGTH 500 MG TABS (ACETAMINOPHEN) Take 1 tablet by mouth every 6 hours as needed for pain IBUPROFEN 600 MG  TABS (IBUPROFEN) Take 1 tablet every 8 hours as needed for pain. PRAVACHOL 20 MG  TABS (PRAVASTATIN SODIUM) Take 1 tab by mouth at bedtime CHANTIX STARTING MONTH PAK 0.5 MG X 11 & 1 MG X 42  MISC (VARENICLINE TARTRATE) Take  as directed. CHANTIX CONTINUING MONTH PAK 1 MG  TABS (VARENICLINE TARTRATE) Take as directed. PLAVIX 75 MG  TABS (CLOPIDOGREL BISULFATE) Take 1 tablet by mouth once a day    Current Allergies: ! MORPHINE SULFATE (MORPHINE SULFATE) ! * IODINATED CONTRAST     Review of Systems  The patient denies fever, weight loss, chest pain, dyspnea on exhertion, abdominal pain, melena, hematochezia, and severe indigestion/heartburn.     Physical Exam  General:     Well-developed,well-nourished,in no acute distress; alert,appropriate and cooperative throughout examination Neck:     No bruits/goiter Lungs:     Normal respiratory effort, chest expands symmetrically. Lungs are clear to auscultation, no crackles or wheezes. Heart:     Normal rate and regular rhythm. S1 and S2 normal without gallop, murmur, click, rub or other extra sounds. Extremities:     No clubbing, cyanosis, edema.    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 Is now on plavix. Will see Dr. Darrick Penna back in followup.  Her updated medication list for this problem includes:    Plavix 75 Mg Tabs (Clopidogrel bisulfate) .Marland Kitchen... Take 1 tablet by mouth once a day    Problem # 2:  DM W/MANIFESTATION NEC, TYPE II,  UNCONTROLLED (ICD-250.82) Last A1C was OK. She states that her CBGs have been in the 300s though. She does not bring in her meter today. Will not change her insulin dose today and instead have her return in 1 month at which time she will bring in her meter and get an A1C and her insulin can be adjusted at that time if necessary.  Her updated medication list for this problem includes:    Lisinopril 20 Mg Tabs (Lisinopril) .Marland Kitchen... Take 1 tablet by mouth once a day    Novolog Mix 70/30 70-30 % Susp (Insulin asp prot & asp (hum)) .Marland KitchenMarland KitchenMarland KitchenMarland Kitchen 100 units subcutaneously every am    Novolog Mix 70/30 70-30 % Susp (Insulin asp prot &  asp (hum)) .Marland KitchenMarland KitchenMarland KitchenMarland Kitchen 50 units subcutaneously every pm  Labs Reviewed: HgBA1c: 7.9 (11/04/2007)   Creat: 0.70 (11/04/2007)      Problem # 3:  HEALTH SCREENING (ICD-V70.0) Mammogram due today.  Orders: Mammogram (Screening) (Mammo)   Problem # 4:  TOBACCO ABUSE (ICD-305.1) She never tried the chantix. At this point in time she does not feel like she can quit smoking. Will revisit this issue at a later date.  The following medications were removed from the medication list:    Chantix Starting Month Pak 0.5 Mg X 11 & 1 Mg X 42 Misc (Varenicline tartrate) .Marland Kitchen... Take as directed.    Chantix Continuing Month Pak 1 Mg Tabs (Varenicline tartrate) .Marland Kitchen... Take as directed.   Problem # 5:  COPD (ICD-496) Stable. No SOB. Will continue her advair.  Her updated medication list for this problem includes:    Advair Diskus 250-50 Mcg/dose Misc (Fluticasone-salmeterol) ..... One puff two times a day    Albuterol 90 Mcg/act Aers (Albuterol) ..... One puff every 6 hrs as needed for wheezing   Problem # 6:  HYPERLIPIDEMIA NEC/NOS (ICD-272.4) Her LDL is not at goal for a diabtic (<70). She never took the statin I had prescribed. Will give her a new prescription and recheck FLP in 6-8 weeks.  Orders: T-Comprehensive Metabolic Panel [id number])  Labs Reviewed: Chol: 165 (09/20/2007)   HDL: 45 (09/20/2007)   LDL: 110 (09/20/2007)   TG: 52 (09/20/2007) SGOT: 20 (09/13/2007)   SGPT: 27 (09/13/2007)  Her updated medication list for this problem includes:    Pravachol 20 Mg Tabs (Pravastatin sodium) .Marland Kitchen... Take 1 tab by mouth at bedtime   Problem # 7:  TRANSAMINASES, SERUM, ELEVATED (ICD-790.4) CMET today.  Problem # 8:  HYPERTENSION, BENIGN ESSENTIAL (ICD-401.1) BP excellent today. Continue current regimen.  Her updated medication list for this problem includes:    Atenolol 100 Mg Tabs (Atenolol) .Marland Kitchen... Take 1 tablet by mouth once a day    Lisinopril 20 Mg Tabs (Lisinopril) .Marland Kitchen... Take 1 tablet by  mouth once a day    Hydrochlorothiazide 25 Mg Tabs (Hydrochlorothiazide) .Marland Kitchen... Take 1 tablet by mouth once a day  BP today: 124/76 Prior BP: 120/80 (11/04/2007)  Labs Reviewed: Creat: 0.70 (11/04/2007) Chol: 165 (09/20/2007)   HDL: 45 (09/20/2007)   LDL: 110 (09/20/2007)   TG: 52 (09/20/2007)   Problem # 9:  BACK PAIN (ICD-724.5) Will have Lela call the pain clinic and reschedule her appointment. My hope is that injections will be helpful to her. I have started her on naproxen today as well.  The following medications were removed from the medication list:    Ibuprofen 600 Mg Tabs (Ibuprofen) .Marland Kitchen... Take 1 tablet every 8 hours as needed for pain.  Her updated medication list  for this problem includes:    Naprosyn 500 Mg Tabs (Naproxen) .Marland Kitchen... Take 1 tablet by mouth two times a day    Tylenol Extra Strength 500 Mg Tabs (Acetaminophen) .Marland Kitchen... Take 1 tablet by mouth every 6 hours as needed for pain   Complete Medication List: 1)  Atenolol 100 Mg Tabs (Atenolol) .... Take 1 tablet by mouth once a day 2)  Lisinopril 20 Mg Tabs (Lisinopril) .... Take 1 tablet by mouth once a day 3)  Hydrochlorothiazide 25 Mg Tabs (Hydrochlorothiazide) .... Take 1 tablet by mouth once a day 4)  Lyrica 100 Mg Caps (Pregabalin) .... Take 1 tablet by mouth three times a day 5)  Advair Diskus 250-50 Mcg/dose Misc (Fluticasone-salmeterol) .... One puff two times a day 6)  Albuterol 90 Mcg/act Aers (Albuterol) .... One puff every 6 hrs as needed for wheezing 7)  Novolog Mix 70/30 70-30 % Susp (Insulin asp prot & asp (hum)) .Marland Kitchen.. 100 units subcutaneously every am 8)  Novolog Mix 70/30 70-30 % Susp (Insulin asp prot & asp (hum)) .... 50 units subcutaneously every pm 9)  Naprosyn 500 Mg Tabs (Naproxen) .... Take 1 tablet by mouth two times a day 10)  Tylenol Extra Strength 500 Mg Tabs (Acetaminophen) .... Take 1 tablet by mouth every 6 hours as needed for pain 11)  Pravachol 20 Mg Tabs (Pravastatin sodium) .... Take 1  tab by mouth at bedtime 12)  Plavix 75 Mg Tabs (Clopidogrel bisulfate) .... Take 1 tablet by mouth once a day   Patient Instructions: 1)  Please schedule a follow-up appointment in 1 month. 2)  Don't forget to bring in your blood sugar meter for your next appointment!!    Prescriptions: PRAVACHOL 20 MG  TABS (PRAVASTATIN SODIUM) Take 1 tab by mouth at bedtime  #31 x 5   Entered and Authorized by:   Peggye Pitt MD   Signed by:   Peggye Pitt MD on 01/24/2008   Method used:   Print then Give to Patient   RxID:   [id number] NAPROSYN 500 MG TABS (NAPROXEN) Take 1 tablet by mouth two times a day  #60 x 3   Entered and Authorized by:   Peggye Pitt MD   Signed by:   Peggye Pitt MD on 01/24/2008   Method used:   Print then Give to Patient   RxID:   [id number] ALBUTEROL 90 MCG/ACT AERS (ALBUTEROL) one puff every 6 hrs as needed for wheezing  #1 month x 11   Entered and Authorized by:   Peggye Pitt MD   Signed by:   Peggye Pitt MD on 01/24/2008   Method used:   Print then Give to Patient   RxID:   [id number] ADVAIR DISKUS 250-50 MCG/DOSE MISC (FLUTICASONE-SALMETEROL) One puff two times a day  #1 month x 11   Entered and Authorized by:   Peggye Pitt MD   Signed by:   Peggye Pitt MD on 01/24/2008   Method used:   Print then Give to Patient   RxID:   [id number] HYDROCHLOROTHIAZIDE 25 MG TABS (HYDROCHLOROTHIAZIDE) Take 1 tablet by mouth once a day  #30 x 11   Entered and Authorized by:   Peggye Pitt MD   Signed by:   Peggye Pitt MD on 01/24/2008   Method used:   Print then Give to Patient   RxID:   [id number] LISINOPRIL 20 MG TABS (LISINOPRIL) Take 1 tablet by mouth once a day  #30 x 11   Entered and Authorized by:  Peggye Pitt MD   Signed by:   Peggye Pitt MD on 01/24/2008   Method used:   Print then Give to Patient   RxID:   [id number] ATENOLOL 100 MG TABS (ATENOLOL) Take 1 tablet by mouth  once a day  #30 x 11   Entered and Authorized by:   Peggye Pitt MD   Signed by:   Peggye Pitt MD on 01/24/2008   Method used:   Print then Give to Patient   RxID:   [id number]  ]

## 2011-01-21 NOTE — Progress Notes (Signed)
Summary: Refill/gh  Phone Note Refill Request Message from:  Pharmacy on March 22, 2010 11:43 AM  Refills Requested: Medication #1:  Diflucan 150 mg  Method Requested: Fax to Local Pharmacy Initial call taken by: Angelina Ok RN,  March 22, 2010 11:45 AM  Follow-up for Phone Call        completed Follow-up by: Mliss Sax MD,  March 22, 2010 2:12 PM    New/Updated Medications: DIFLUCAN 100 MG TABS (FLUCONAZOLE) Take 1 tablet by mouth two times a day Prescriptions: DIFLUCAN 100 MG TABS (FLUCONAZOLE) Take 1 tablet by mouth two times a day  #10 x 0   Entered and Authorized by:   Mliss Sax MD   Signed by:   Mliss Sax MD on 03/22/2010   Method used:   Faxed to ...       [facility] (retail)       [address] [place], [place]  [address]       Ph: [phone]       Fax: [phone]   RxID:   [id number]

## 2011-01-21 NOTE — Assessment & Plan Note (Signed)
Summary: FU 1WK/[name]/VS   Vital Signs:  Patient Profile:   50 Years Old Female Height:     66 inches (167.64 cm) Weight:      239.6 pounds (108.91 kg) BMI:     38.81 Temp:     97.7 degrees F (36.50 degrees C) oral Pulse rate:   75 / minute BP sitting:   156 / 96  (right arm)  Vitals Entered By: Stanton Kidney Ditzler RN (August 17, 2007 3:14 PM)             Is Patient Diabetic? Yes  Nutritional Status BMI of > 30 = obese Nutritional Status Detail fair CBG Result 102  Have you ever been in a relationship where you felt threatened, hurt or afraid?denies   Does patient need assistance? Functional Status Self care Ambulation Normal   PCP:  Peggye Pitt MD  Chief Complaint:  FU - still off BP meds for 2weeks and has pain right hip.Marland Kitchen  History of Present Illness: Jennifer [name] is a 50 yo AA woman with multiple medical problems including poorly controlled DM. I saw her in [facility] 1 week ago with Dr Noel Gerold. She was complaining of several days of right lip/face numbness as well as loss of taste on the right side of her tongue. We felt this was c/w Bell's Palsy and started her on Prednisone. She is in today for follow-up. She still complains of the same symptoms and feels they may be a bit worse. Of note, she has no difficulty with closing her eye and no decreased tearing in the eye.  Current Allergies (reviewed today): ! MORPHINE SULFATE (MORPHINE SULFATE) ! * IODINATED CONTRAST    Risk Factors:  Tobacco use:  current    Year started:  years Seatbelt use:  100 %  Mammogram History:    Date of Last Mammogram:  07/26/2004  PAP Smear History:    Date of Last PAP Smear:  08/17/2001    Physical Exam  General:     overweight-appearing.   Ears:     No vesicles. Neurologic:     R. facial droop. No loss of nasolabial fold. Closes eyes tight and is able to resist opening. Raises both eyebrows. Tongue midline.    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 Therefore, will get an MRI to make sure there is not another cause for her symptoms. I would not expect her sypmtoms to be resolved if this is Bell's Palsy as that can take months. I d/w pt in detail and she agrees with plan. Orders: MRI (MRI)   Problem # 2:  DM W/MANIFESTATION NEC, TYPE II, UNCONTROLLED (ICD-250.82) She is out of all of her meds. I have given her Rx or samples of these. Her updated medication list for this problem includes:    Lisinopril 20 Mg Tabs (Lisinopril) .Marland Kitchen... Take 1 tablet by mouth once a day    Novolog Mix 70/30 70-30 % Susp (Insulin asp prot & asp (hum)) .Marland KitchenMarland KitchenMarland KitchenMarland Kitchen 100 units subcutaneously every am    Novolog Mix 70/30 70-30 % Susp (Insulin asp prot & asp (hum)) .Marland KitchenMarland KitchenMarland KitchenMarland Kitchen 50 units subcutaneously every pm  Orders: T- Capillary Blood Glucose (82948) T-Hgb A1C (in-house) ([address]UE)   Problem # 3:  HYPERTENSION, BENIGN ESSENTIAL (ICD-401.1) As above, out of all meds. Rx or samples given for all. Her updated medication list for this problem includes:    Atenolol 100 Mg Tabs (Atenolol) .Marland KitchenMarland KitchenMarland KitchenMarland Kitchen  Take 1 tablet by mouth once a day    Lisinopril 20 Mg Tabs (Lisinopril) .Marland Kitchen... Take 1 tablet by mouth once a day    Hydrochlorothiazide 25 Mg Tabs (Hydrochlorothiazide) .Marland Kitchen... Take 1 tablet by mouth once a day   Complete Medication List: 1)  Atenolol 100 Mg Tabs (Atenolol) .... Take 1 tablet by mouth once a day 2)  Lisinopril 20 Mg Tabs (Lisinopril) .... Take 1 tablet by mouth once a day 3)  Hydrochlorothiazide 25 Mg Tabs (Hydrochlorothiazide) .... Take 1 tablet by mouth once a day 4)  Lyrica 100 Mg Caps (Pregabalin) .... Take 1 tablet by mouth three times a day 5)  Advair Diskus 250-50 Mcg/dose Misc (Fluticasone-salmeterol) .... One puff two times a day 6)  Albuterol 90 Mcg/act Aers (Albuterol) .... One puff every 6 hrs as needed for wheezing 7)  Novolog Mix 70/30 70-30 % Susp (Insulin asp prot & asp (hum)) .Marland Kitchen..  100 units subcutaneously every am 8)  Novolog Mix 70/30 70-30 % Susp (Insulin asp prot & asp (hum)) .... 50 units subcutaneously every pm 9)  Naprosyn 500 Mg Tabs (Naproxen) .... Take 1 tablet by mouth two times a day 10)  Flexeril 10 Mg Tabs (Cyclobenzaprine hcl) .Marland Kitchen.. 1 tablet at bedtime 11)  Tylenol Extra Strength 500 Mg Tabs (Acetaminophen) .... Take 1 tablet by mouth every 6 hours as needed for pain 12)  Ibuprofen 600 Mg Tabs (Ibuprofen) .... Take 1 tablet every 8 hours as needed for pain. 13)  Prednisone 10 Mg Tabs (Prednisone) .... Take 6 tabs by mouth once daily for 7 days   Patient Instructions: 1)  Please schedule a follow-up appointment with Dr Ardyth Harps in the next available slot.  2)  Come for your MRI tomorrow morning at 8:00 am. 3)  Contact [facility] about getting your insulin. You must take this as directed (twice a day). 4)  [facility] I have given you prescriptions for all of them. You need to take these every day.    Prescriptions: HYDROCHLOROTHIAZIDE 25 MG TABS (HYDROCHLOROTHIAZIDE) Take 1 tablet by mouth once a day  #30 x 3   Entered and Authorized by:   Ned Grace MD   Signed by:   Ned Grace MD on 08/17/2007   Method used:   Print then Give to Patient   RxID:   [id number] LISINOPRIL 20 MG TABS (LISINOPRIL) Take 1 tablet by mouth once a day  #30 x 3   Entered and Authorized by:   Ned Grace MD   Signed by:   Ned Grace MD on 08/17/2007   Method used:   Print then Give to Patient   RxID:   [id number] ATENOLOL 100 MG TABS (ATENOLOL) Take 1 tablet by mouth once a day  #30 x 3   Entered and Authorized by:   Harriett Sine Rosanne Wohlfarth MD   Signed by:   Harriett Sine Yohan Samons MD on 08/17/2007   Method used:   Print then Give to Patient   RxID:   [id number] NOVOLOG MIX 70/30 70-30 % SUSP (INSULIN ASP PROT & ASP (HUM)) 50 units subcutaneously every pm  #as needed x 11   Entered and Authorized by:   Harriett Sine Jolinda Pinkstaff MD   Signed by:    Harriett Sine Emilene Roma MD on 08/17/2007   Method used:   Print then Give to Patient   RxID:   [id number] NOVOLOG MIX 70/30 70-30 % SUSP (INSULIN ASP PROT & ASP (HUM)) 100 units Subcutaneously every am  #as  needed x 11   Entered and Authorized by:   Ned Grace MD   Signed by:   Ned Grace MD on 08/17/2007   Method used:   Print then Give to Patient   RxID:   [id number] ALBUTEROL 90 MCG/ACT AERS (ALBUTEROL) one puff every 6 hrs as needed for wheezing  #1 month x 11   Entered and Authorized by:   Ned Grace MD   Signed by:   Ned Grace MD on 08/17/2007   Method used:   Print then Give to Patient   RxID:   [id number] ADVAIR DISKUS 250-50 MCG/DOSE MISC (FLUTICASONE-SALMETEROL) One puff two times a day  #1 month x 11   Entered and Authorized by:   Harriett Sine Casey Fye MD   Signed by:   Ned Grace MD on 08/17/2007   Method used:   Print then Give to Patient   RxID:   [id number]   Laboratory Results   Blood Tests   Date/Time Recieved: August 17, 2007 3:31 PM  Date/Time Reported: ..................................................................Marland KitchenAlric Quan  August 17, 2007 3:31 PM   HGBA1C: 8.2%   (Normal Range: Non-Diabetic - 3-6%   Control Diabetic - 6-8%) CBG Random: 102      Appended Document: FU 1WK/[name]/VS   Impression & Recommendations:  Problem # 1:  BELL'S PALSY (ICD-351.0) MRI: No acute changes; small vessel dz from DM/HTN vs demyelinating process vs vasculitis. I suspect it is from her DM/HTN. However, will order ANA, ESR, ANCA to r/o vasculitis. She will also need an LP to r/o Jennifer. Needs to be on ASA. Future Orders: T-Antinuclear Antib (ANA) [id number]) ... 08/23/2007 T-Sed Rate (Automated) [id number]) ... 08/23/2007 T- * Misc. Laboratory test [id number]) ... 08/23/2007   Complete Medication List: 1)  Atenolol 100 Mg Tabs (Atenolol) .... Take 1 tablet by mouth once a day 2)  Lisinopril 20 Mg Tabs (Lisinopril) .... Take 1 tablet by mouth  once a day 3)  Hydrochlorothiazide 25 Mg Tabs (Hydrochlorothiazide) .... Take 1 tablet by mouth once a day 4)  Lyrica 100 Mg Caps (Pregabalin) .... Take 1 tablet by mouth three times a day 5)  Advair Diskus 250-50 Mcg/dose Misc (Fluticasone-salmeterol) .... One puff two times a day 6)  Albuterol 90 Mcg/act Aers (Albuterol) .... One puff every 6 hrs as needed for wheezing 7)  Novolog Mix 70/30 70-30 % Susp (Insulin asp prot & asp (hum)) .Marland Kitchen.. 100 units subcutaneously every am 8)  Novolog Mix 70/30 70-30 % Susp (Insulin asp prot & asp (hum)) .... 50 units subcutaneously every pm 9)  Naprosyn 500 Mg Tabs (Naproxen) .... Take 1 tablet by mouth two times a day 10)  Flexeril 10 Mg Tabs (Cyclobenzaprine hcl) .Marland Kitchen.. 1 tablet at bedtime 11)  Tylenol Extra Strength 500 Mg Tabs (Acetaminophen) .... Take 1 tablet by mouth every 6 hours as needed for pain 12)  Ibuprofen 600 Mg Tabs (Ibuprofen) .... Take 1 tablet every 8 hours as needed for pain. 13)  Prednisone 10 Mg Tabs (Prednisone) .... Take 6 tabs by mouth once daily for 7 days

## 2011-01-21 NOTE — Assessment & Plan Note (Signed)
Summary: ROUTINE CK/EST/VS   Vital Signs:  Patient profile:   50 year old female Height:      66 inches (167.64 cm) Weight:      257.0 pounds (116.82 kg) BMI:     41.63 Temp:     97.0 degrees F (36.11 degrees C) oral Pulse rate:   64 / minute BP sitting:   128 / 72  (right arm)  Vitals Entered By: Stanton Kidney Ditzler RN (April 01, 2010 10:47 AM) CC: Depression Is Patient Diabetic? Yes Did you bring your meter with you today? No Pain Assessment Patient in pain? yes     Location: back and upper left abd Intensity: 8 Type: burning Onset of pain  past 3 weeks Nutritional Status BMI of > 30 = obese Nutritional Status Detail appetite good  Have you ever been in a relationship where you felt threatened, hurt or afraid?denies   Does patient need assistance? Functional Status Self care Ambulation Impaired:Risk for fall Comments Uses a cane. Ck-up and discuss left upper burning in abd past 3 weeks. Off diabetes med since 02/2010 cramps and freq BM. CBG range from 170 - 530.   Primary Care Provider:  Mliss Sax MD  CC:  Depression.  History of Present Illness: 49 yo female with PMH outline below presents to [facility] for regular follow up appointment. She has no concerns at the time. No recent sicknesses or hospitalizaitons. No episodes of chest pain, SOB, palpitations. No specific abdominal or urinary concerns. No recent changes in appetite, weight, sleep patterns, mood. Reports compliance with medications.    Depression History:      The patient denies a depressed mood most of the day and a diminished interest in her usual daily activities.  The patient denies significant weight loss, significant weight gain, insomnia, hypersomnia, psychomotor agitation, psychomotor retardation, fatigue (loss of energy), feelings of worthlessness (guilt), impaired concentration (indecisiveness), and recurrent thoughts of death or suicide.        The patient denies that she feels like life is not worth  living, denies that she wishes that she were dead, and denies that she has thought about ending her life.         Preventive Screening-Counseling & Management  Alcohol-Tobacco     Alcohol drinks/day: 0     Smoking Status: current     Smoking Cessation Counseling: yes     Packs/Day: 1 ppd     Year Started: years     Passive Smoke Exposure: yes  Caffeine-Diet-Exercise     Does Patient Exercise: no  Problems Prior to Update: 1)  Hyperlipidemia Nec/nos  (ICD-272.4) 2)  Hypertension, Benign Essential  (ICD-401.1) 3)  Hypertension  (ICD-401.9) 4)  Chest Pain  (ICD-786.50) 5)  Insomnia  (ICD-780.52) 6)  Candidiasis, Vaginal  (ICD-112.1) 7)  Hip Pain, Right  (ICD-719.45) 8)  Boils, Recurrent  (ICD-680.9) 9)  Diab W/neuro Manifests Type Ii/uns Not Uncntrl  (ICD-250.60) 10)  Depression  (ICD-311) 11)  Diabetic Foot Ulcer, Right  (ICD-250.80) 12)  Claudication  (ICD-443.9) 13)  Dm W/manifestation Nec, Type II, Uncontrolled  (ICD-250.82) 14)  Tobacco Abuse  (ICD-305.1) 15)  COPD  (ICD-496) 16)  Transaminases, Serum, Elevated  (ICD-790.4) 17)  Diabetic Peripheral Neuropathy  (ICD-250.60) 18)  Knee Pain  (ICD-719.46) 19)  Fracture, Toe, Left  (ICD-826.0) 20)  Diabetic Foot Ulcer, Left  (ICD-250.80) 21)  Inadequate Material Resources  (ICD-V60.2) 22)  Nausea  (ICD-787.02) 23)  Weight Loss  (ICD-783.21) 24)  Dysphagia Unspecified  (ICD-787.20)  25)  Gastroesophageal Reflux Disease  (ICD-530.81) 26)  Ulcer of Other Part of Foot  (ICD-707.15) 27)  Other&unspecified Diseases The Oral Soft Tissues  (ICD-528.9) 28)  Abscess, Skin  (ICD-682.9) 29)  Bell's Palsy  (ICD-351.0) 30)  Back Pain  (ICD-724.5) 31)  Health Screening  (ICD-V70.0) 32)  Abscess, Perirectal, Hx of  (ICD-V13.3) 33)  Renal Failure, Acute, Hx of  (ICD-V13.09) 34)  Glaucoma  (ICD-365.9) 35)  Disc Disease, Lumbar  (ICD-722.52)  Medications Prior to Update: 1)  Atenolol 100 Mg Tabs (Atenolol) .... Take 1 Tablet By Mouth  Once A Day 2)  Lisinopril 20 Mg Tabs (Lisinopril) .... Take 1 Tablet By Mouth Once A Day 3)  Hydrochlorothiazide 25 Mg Tabs (Hydrochlorothiazide) .... Take 1 Tablet By Mouth Once A Day 4)  Advair Diskus 250-50 Mcg/dose Misc (Fluticasone-Salmeterol) .... One Puff Two Times A Day 5)  Tylenol Extra Strength 500 Mg Tabs (Acetaminophen) .... Take 1 Tablet By Mouth Every 6 Hours As Needed For Pain 6)  Plavix 75 Mg  Tabs (Clopidogrel Bisulfate) .... Take 1 Tablet By Mouth Once A Day 7)  Ra Pen Needles 31g X 8 Mm Misc (Insulin Pen Needle) .... Use 3 Times Per Day To Check The Sugar Level 8)  Amitriptyline Hcl 75 Mg Tabs (Amitriptyline Hcl) .... Take 1 Tablet By Mouth Two Times A Day 9)  Lyrica 100 Mg Caps (Pregabalin) .... Take 1 Tablet By Mouth Three Times A Day 10)  Tramadol Hcl 50 Mg Tabs (Tramadol Hcl) .... One Tab Up To Four Times A Day For Pain 11)  Humalog Mix 75/25 Pen 75-25 % Susp (Insulin Lispro Prot & Lispro) .... Inject Under The Skin 110 Units in The Am and 55 Units in The Evening Every Day 12)  Truetrack Test  Strp (Glucose Blood) .... Use To Check Blood Sugar Twice Daily 13)  Lancets  Misc (Lancets) .... Use To Check Blood Sugar Twice Daily 14)  Nitrostat 0.4 Mg Subl (Nitroglycerin) .... Take One Tablet Under Your Tongue With Severe Chest Pain, Repeat in 5 Minutes. Attend To Doctor/hospital If Had To Take More Then 3 in 20 Minutes 15)  Lidoderm 5 % Ptch (Lidocaine) .... Apply To The Affected Area, 12 Hours On and 12 Hours Off 16)  Trazodone Hcl 50 Mg Tabs (Trazodone Hcl) .... Take 1 Tab By Mouth At Bedtime As Needed To Fall Sleep. 17)  Metformin Hcl 1000 Mg Tabs (Metformin Hcl) .... Take 1/2 Tablet By Mouth Two Times A Day 18)  Diflucan 100 Mg Tabs (Fluconazole) .... Take 1 Tablet By Mouth Two Times A Day  Current Medications (verified): 1)  Atenolol 100 Mg Tabs (Atenolol) .... Take 1 Tablet By Mouth Once A Day 2)  Lisinopril 20 Mg Tabs (Lisinopril) .... Take 1 Tablet By Mouth Once A  Day 3)  Hydrochlorothiazide 25 Mg Tabs (Hydrochlorothiazide) .... Take 1 Tablet By Mouth Once A Day 4)  Advair Diskus 250-50 Mcg/dose Misc (Fluticasone-Salmeterol) .... One Puff Two Times A Day 5)  Tylenol Extra Strength 500 Mg Tabs (Acetaminophen) .... Take 1 Tablet By Mouth Every 6 Hours As Needed For Pain 6)  Plavix 75 Mg  Tabs (Clopidogrel Bisulfate) .... Take 1 Tablet By Mouth Once A Day 7)  Ra Pen Needles 31g X 8 Mm Misc (Insulin Pen Needle) .... Use 3 Times Per Day To Check The Sugar Level 8)  Amitriptyline Hcl 75 Mg Tabs (Amitriptyline Hcl) .... Take 1 Tablet By Mouth Two Times A Day 9)  Lyrica 100 Mg  Caps (Pregabalin) .... Take 1 Tablet By Mouth Three Times A Day 10)  Tramadol Hcl 50 Mg Tabs (Tramadol Hcl) .... One Tab Up To Four Times A Day For Pain 11)  Humalog Mix 75/25 Pen 75-25 % Susp (Insulin Lispro Prot & Lispro) .... Inject Under The Skin 110 Units in The Am and 55 Units in The Evening Every Day 12)  Truetrack Test  Strp (Glucose Blood) .... Use To Check Blood Sugar Twice Daily 13)  Lancets  Misc (Lancets) .... Use To Check Blood Sugar Twice Daily 14)  Nitrostat 0.4 Mg Subl (Nitroglycerin) .... Take One Tablet Under Your Tongue With Severe Chest Pain, Repeat in 5 Minutes. Attend To Doctor/hospital If Had To Take More Then 3 in 20 Minutes 15)  Lidoderm 5 % Ptch (Lidocaine) .... Apply To The Affected Area, 12 Hours On and 12 Hours Off 16)  Trazodone Hcl 50 Mg Tabs (Trazodone Hcl) .... Take 1 Tab By Mouth At Bedtime As Needed To Fall Sleep. 17)  Metformin Hcl 1000 Mg Tabs (Metformin Hcl) .... Take 1/2 Tablet By Mouth Two Times A Day 18)  Diflucan 100 Mg Tabs (Fluconazole) .... Take 1 Tablet By Mouth Two Times A Day  Allergies: 1)  ! Morphine Sulfate (Morphine Sulfate) 2)  ! * Iodinated Contrast  Past History:  Past Medical History: Last updated: 11/30/2009 Current Problems:  HYPERLIPIDEMIA NEC/NOS (ICD-272.4) HYPERTENSION, BENIGN ESSENTIAL (ICD-401.1) CANDIDIASIS, VAGINAL  (ICD-112.1) BOILS, RECURRENT (ICD-680.9) DIAB W/NEURO MANIFESTS TYPE II/UNS NOT UNCNTRL (ICD-250.60) DEPRESSION (ICD-311) DIABETIC FOOT ULCER, RIGHT (ICD-250.80) PVD DM W/MANIFESTATION NEC, TYPE II, UNCONTROLLED (ICD-250.82) COPD (ICD-496) TRANSAMINASES, SERUM, ELEVATED (ICD-790.4) DIABETIC PERIPHERAL NEUROPATHY (ICD-250.60) GASTROESOPHAGEAL REFLUX DISEASE (ICD-530.81) BELL'S PALSY (ICD-351.0) ABSCESS, PERIRECTAL, HX OF (ICD-V13.3) RENAL FAILURE, ACUTE, HX OF (ICD-V13.09) with contrast H/O elevated LFTs with statins GLAUCOMA (ICD-365.9) DISC DISEASE, 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   Social History: Last updated: 11/30/2009 Lives with daughter-in-law in apartment, currently unemployed Divorced  Tobacco Use - Yes.  Alcohol Use - no  Risk Factors: Alcohol Use: 0 (04/01/2010) Exercise: no (04/01/2010)  Risk Factors: Smoking Status: current (04/01/2010) Packs/Day: 1 ppd (04/01/2010) Passive Smoke Exposure: yes (04/01/2010)  Family History: Reviewed history from 11/23/2009 and no changes required. Mother  DM2, CHF, living Father asthma, living Brothers and sisters asthma since childhood Younger sister had heart attack at age of 18, since then she is doing well.   Social History: Reviewed history from 11/30/2009 and no changes required. Lives with daughter-in-law in apartment, currently unemployed Divorced  Tobacco Use - Yes.  Alcohol Use - no  Review of Systems       per HPI   Physical Exam  General:  Well-developed,well-nourished,in no acute distress;  alert,appropriate and cooperative throughout examination Lungs:  Normal respiratory effort, chest expands symmetrically. Lungs are clear to auscultation, no crackles or wheezes. Heart:  Normal rate and regular rhythm. S1 and S2 normal without gallop, murmur, click, rub or other extra sounds. Abdomen:  Bowel sounds positive,abdomen soft and non-tender without masses, organomegaly or hernias noted. Neurologic:  No cranial nerve deficits noted. Station and gait are normal. Plantar reflexes are down-going bilaterally. DTRs are symmetrical throughout. Sensory, motor and coordinative  functions appear intact. Psych:  Cognition and judgment appear intact. Alert and cooperative with normal attention span and concentration. No apparent delusions, illusions, hallucinations   Impression & Recommendations:  Problem # 1:  HYPERLIPIDEMIA NEC/NOS (ICD-272.4) Recently checked, Will continue lisinopril. Labs Reviewed: SGOT: 37 (02/07/2010)   SGPT: 39 (02/07/2010)   HDL:35 (02/07/2010), 30 (11/24/2008)  LDL:109 (02/07/2010), 79 (09/81/1914)  Chol:173 (02/07/2010), 171 (11/24/2008)  Trig:145 (02/07/2010), 310 (11/24/2008)  Problem # 2:  HYPERTENSION, BENIGN ESSENTIAL (ICD-401.1) At goal, will continue the same regimen.  Her updated medication list for this problem includes:    Atenolol 100 Mg Tabs (Atenolol) .Marland Kitchen... Take 1 tablet by mouth once a day    Lisinopril 20 Mg Tabs (Lisinopril) .Marland Kitchen... Take 1 tablet by mouth once a day    Hydrochlorothiazide 25 Mg Tabs (Hydrochlorothiazide) .Marland Kitchen... Take 1 tablet by mouth once a day  BP today: 128/72 Prior BP: 112/67 (02/07/2010)  Labs Reviewed: K+: 3.9 (02/07/2010) Creat: : 0.67 (02/07/2010)   Chol: 173 (02/07/2010)   HDL: 35 (02/07/2010)   LDL: 109 (02/07/2010)   TG: 145 (02/07/2010)  Problem # 3:  DIAB W/NEURO MANIFESTS TYPE II/UNS NOT UNCNTRL (ICD-250.60) A1C improving, pt has no meter today, I have encouraged her to bring it in next time. Will continue the same  regimen. Her updated medication list for this problem includes:    Lisinopril 20 Mg Tabs (Lisinopril) .Marland Kitchen... Take 1 tablet by mouth once a day    Humalog Mix 75/25 Pen 75-25 % Susp (Insulin lispro prot & lispro) ..... Inject under the skin 110 units in the am and 55 units in the evening every day    Metformin Hcl 1000 Mg Tabs (Metformin hcl) .Marland Kitchen... Take 1/2 tablet by mouth two times a day  Labs Reviewed: Creat: 0.67 (02/07/2010)     Last Eye Exam: No diabetic retinopathy.    (05/31/2008) Reviewed HgBA1c results: 9.8 (02/07/2010)  10.2 (10/25/2009)  Problem # 4:  TOBACCO ABUSE (ICD-305.1)  Encouraged smoking cessation and discussed different methods for smoking cessation.   Complete Medication List: 1)  Atenolol 100 Mg Tabs (Atenolol) .... Take 1 tablet by mouth once a day 2)  Lisinopril 20 Mg Tabs (Lisinopril) .... Take 1 tablet by mouth once a day 3)  Hydrochlorothiazide 25 Mg Tabs (Hydrochlorothiazide) .... Take 1 tablet by mouth once a day 4)  Advair Diskus 250-50 Mcg/dose Misc (Fluticasone-salmeterol) .... One puff two times a day 5)  Tylenol Extra Strength 500 Mg Tabs (Acetaminophen) .... Take 1 tablet by mouth every 6 hours as needed for pain 6)  Plavix 75 Mg Tabs (Clopidogrel bisulfate) .... Take 1 tablet by mouth once a day 7)  Ra Pen Needles 31g X 8 Mm Misc (Insulin pen needle) .... Use 3 times per day to check the sugar level 8)  Amitriptyline Hcl 75 Mg Tabs (Amitriptyline hcl) .... Take 1 tablet by mouth two times a day 9)  Lyrica 100 Mg Caps (Pregabalin) .... Take 1 tablet by mouth three times a day 10)  Tramadol Hcl 50 Mg Tabs (Tramadol hcl) .... One tab up to four times a day for pain 11)  Humalog Mix 75/25 Pen 75-25 % Susp (Insulin lispro prot & lispro) .... Inject under the skin 110 units in the am and 55 units in the evening every day 12)  Truetrack Test Strp (Glucose blood) .... Use to check blood sugar twice daily 13)  Lancets Misc (Lancets) .... Use to check blood  sugar twice daily 14)  Nitrostat 0.4  Mg Subl (Nitroglycerin) .... Take one tablet under your tongue with severe chest pain, repeat in 5 minutes. attend to doctor/hospital if had to take more then 3 in 20 minutes 15)  Lidoderm 5 % Ptch (Lidocaine) .... Apply to the affected area, 12 hours on and 12 hours off 16)  Trazodone Hcl 50 Mg Tabs (Trazodone hcl) .... Take 1 tab by mouth at bedtime as needed to fall sleep. 17)  Metformin Hcl 1000 Mg Tabs (Metformin hcl) .... Take 1/2 tablet by mouth two times a day 18)  Diflucan 100 Mg Tabs (Fluconazole) .... Take 1 tablet by mouth two times a day  Patient Instructions: 1)  Please schedule a follow-up appointment in 3 months. 2)  Please check your sugar levels regularly and remember to bring the meter with you to the next clinic appointment, if the sugars are > 350 or < 60 please call us at [phone] 3)  Please check your blood pressure regularly, if it is >170 please call clinic at [phone]   Prevention & Chronic Care Immunizations   Influenza vaccine: Not documented   Influenza vaccine deferral: Not indicated  (04/01/2010)    Tetanus booster: Not documented   Td booster deferral: Not indicated  (10/25/2009)    Pneumococcal vaccine: Not documented  Colorectal Screening   Hemoccult: Not documented   Hemoccult action/deferral: Not indicated  (10/25/2009)    Colonoscopy: Not documented   Colonoscopy action/deferral: Not indicated  (10/25/2009)  Other Screening   Pap smear: Normal  (08/17/2001)   Pap smear action/deferral: Not indicated-other  (04/01/2010)    Mammogram: No specific mammographic evidence of malignancy.  Assessment: BIRADS 1.   (04/11/2008)   Mammogram action/deferral: Refused  (04/01/2010)   Mammogram due: 11/09/2009   Smoking status: current  (04/01/2010)   Smoking cessation counseling: yes  (04/01/2010)  Diabetes Mellitus   HgbA1C: 9.8  (02/07/2010)   HgbA1C action/deferral: Ordered  (09/14/2009)    Eye exam: No  diabetic retinopathy.     (05/31/2008)   Diabetic eye exam action/deferral: Refused  (04/01/2010)   Eye exam due: 05/2009    Foot exam: yes  (10/25/2009)   High risk foot: Not documented   Foot care education: Not documented    Urine microalbumin/creatinine ratio: 9.4  (09/14/2009)   Urine microalbumin action/deferral: Ordered    Diabetes flowsheet reviewed?: Yes   Progress toward A1C goal: Improved  Lipids   Total Cholesterol: 173  (02/07/2010)   LDL: 109  (02/07/2010)   LDL Direct: Not documented   HDL: 35  (02/07/2010)   Triglycerides: 145  (02/07/2010)    SGOT (AST): 37  (02/07/2010)   SGPT (ALT): 39  (02/07/2010)   Alkaline phosphatase: 85  (02/07/2010)   Total bilirubin: 0.3  (02/07/2010)    Lipid flowsheet reviewed?: Yes   Progress toward LDL goal: Unchanged  Hypertension   Last Blood Pressure: 128 / 72  (04/01/2010)   Serum creatinine: 0.67  (02/07/2010)   Serum potassium 3.9  (02/07/2010)    Hypertension flowsheet reviewed?: Yes   Progress toward BP goal: At goal  Self-Management Support :   Personal Goals (by the next clinic visit) :     Personal A1C goal: 7  (09/14/2009)     Personal blood pressure goal: 130/80  (09/14/2009)     Personal LDL goal: 100  (09/14/2009)    Patient will work on the following items until the next clinic visit to reach self-care goals:     Medications and monitoring: take my medicines every  day, check my blood sugar, examine my feet every day  (04/01/2010)     Eating: eat more vegetables, use fresh or frozen vegetables, eat fruit for snacks and desserts  (04/01/2010)     Activity: take a 30 minute walk every day  (02/07/2010)    Diabetes self-management support: Written self-care plan, Education handout, Resources for patients handout  (04/01/2010)   Diabetes care plan printed   Diabetes education handout printed   Last diabetes self-management training by diabetes educator: 02/07/2010    Hypertension self-management support:  Written self-care plan, Education handout, Resources for patients handout  (04/01/2010)   Hypertension self-care plan printed.   Hypertension education handout printed    Lipid self-management support: Written self-care plan, Education handout, Resources for patients handout  (04/01/2010)   Lipid self-care plan printed.   Lipid education handout printed      Resource handout printed.

## 2011-01-21 NOTE — Assessment & Plan Note (Signed)
Summary: Cardiology Nuclear Study  Nuclear Med Background Indications for Stress Test: Evaluation for Ischemia   History: COPD, Echo  History Comments: '07 Echo: EF=55-60%  Symptoms: Chest Pain, Dizziness, DOE, Fatigue, Light-Headedness, Palpitations, Rapid HR  Symptoms Comments: sharp chest pain upper Lt chest distal clavicle   last episode 30 min after arrival to appt.  (having same off/on for a while)   Nuclear Pre-Procedure Cardiac Risk Factors: Family History - CAD, Hypertension, IDDM Type 2, Lipids, PVD, Smoker Caffeine/Decaff Intake: None NPO After: 12:00 AM Lungs: clear IV 0.9% NS with Angio Cath: 22g     IV Site: (R)AC IV Started by: Burna Mortimer Deal RT-N Chest Size (in) 44     Cup Size D     Height (in): 66 Weight (lb): 252 BMI: 40.82  Nuclear Med Study 1 or 2 day study:  1 day     Stress Test Type:  Eugenie Birks Reading MD:  Olga Millers, MD     Referring MD:  B.Emmarose Klinke Resting Radionuclide:  Technetium 50m Tetrofosmin     Resting Radionuclide Dose:  11.0 mCi  Stress Radionuclide:  Technetium 66m Tetrofosmin     Stress Radionuclide Dose:  33.0 mCi   Stress Protocol   Lexiscan: 0.4 mg   Stress Test Technologist:  Frederick Peers EMT-P     Nuclear Technologist:  Harlow Asa CNMT  Rest Procedure  Myocardial perfusion imaging was performed at rest 45 minutes following the intravenous administration of Myoview Technetium 106m Tetrofosmin.  Stress Procedure  The patient received IV Lexiscan 0.4 mg over 15-seconds.  Myoview injected at 30-seconds.  There were no significant changes with infusion.  Quantitative spect images were obtained after a 45 minute delay.  QPS Raw Data Images:  Acuisition technically good; normal left ventricular size. Stress Images:  There is normal uptake in all areas. Rest Images:  Normal homogeneous uptake in all areas of the myocardium. Subtraction (SDS):  No evidence of ischemia. Transient Ischemic Dilatation:  .98  (Normal <1.22)  Lung/Heart Ratio:  .30  (Normal <0.45)  Quantitative Gated Spect Images QGS EDV:  87 ml QGS ESV:  29 ml QGS EF:  67 % QGS cine images:  Normal wall motion.   Overall Impression  Exercise Capacity: Lexiscan study with no exercise. ECG Impression: No significant ST segment change suggestive of ischemia. Overall Impression: There is no sign of scar or ischemia.  Appended Document: Cardiology Nuclear Study ok  Appended Document: Cardiology Nuclear Study PT AWARE./CY

## 2011-01-21 NOTE — Progress Notes (Signed)
Summary: refill/hla  Phone Note Refill Request Message from:  Patient on November 22, 2009 11:42 AM  Refills Requested: Medication #1:  DIFLUCAN 150 MG TABS Take 1 tablet today and then stop   Dosage confirmed as above?Dosage Confirmed   Supply Requested: 1 month  Medication #2:  PAXIL 40 MG TABS Take 1 tablet once daily for depression and anxiety   Dosage confirmed as above?Dosage Confirmed   Supply Requested: 1 month Initial call taken by: Marin Roberts RN,  November 22, 2009 11:42 AM  Follow-up for Phone Call        Not to be too particular --- BUT:  Am I to be allowed to know what this is for?  I'm hung up on the old-fashioned notion that diagnosis should precede treatment. Follow-up by: Ulyess Mort MD,  November 22, 2009 11:55 AM  Additional Follow-up for Phone Call Additional follow up Details #1::        pt has recurrent vaginal yeast r/t diabetes Additional Follow-up by: Marin Roberts RN,  November 22, 2009 3:36 PM    Prescriptions: DIFLUCAN 150 MG TABS (FLUCONAZOLE) Take 1 tablet today and then stop  #1 x 1   Entered and Authorized by:   Doneen Poisson MD   Signed by:   Doneen Poisson MD on 11/22/2009   Method used:   Faxed to ...       [facility] Department (retail)       [address] [place], [place]  [address]       Ph: [phone]       Fax: [phone]   RxID:   [id number] PAXIL 40 MG TABS (PAROXETINE HCL) Take 1 tablet once daily for depression and anxiety  #30 x 3   Entered and Authorized by:   Doneen Poisson MD   Signed by:   Doneen Poisson MD on 11/22/2009   Method used:   Faxed to ...       [facility] Department (retail)       [address] [place], [place]  [address]       Ph: [phone]       Fax: [phone]   RxID:   [id number]

## 2011-01-21 NOTE — Progress Notes (Signed)
Summary: Refill/gh  Phone Note Refill Request Message from:  [employer] on February 06, 2009 3:41 PM  Refills Requested: Medication #1:  LISINOPRIL 20 MG TABS Take 1 tablet by mouth once a day   Last Refilled: 12/21/2008  Medication #2:  HYDROCHLOROTHIAZIDE 25 MG TABS Take 1 tablet by mouth once a day   Last Refilled: 12/21/2008  Medication #3:  ATENOLOL 100 MG TABS Take 1 tablet by mouth once a day   Last Refilled: 12/21/2008  Medication #4:  PRAVACHOL 20 MG  TABS Take 1 tab by mouth at bedtime  Method Requested: Electronic Initial call taken by: Angelina Ok RN,  February 06, 2009 3:41 PM  Follow-up for Phone Call        Prescription sent electronically to patient's pharmacy.  Follow-up by: Chauncey Reading DO,  February 07, 2009 9:17 AM      Prescriptions: PRAVACHOL 20 MG  TABS (PRAVASTATIN SODIUM) Take 1 tab by mouth at bedtime  #30 x 3   Entered and Authorized by:   Chauncey Reading DO   Signed by:   Chauncey Reading DO on 02/07/2009   Method used:   Electronically to        [facility] (retail)       [address]       [place], [place]  [address]       Ph: [phone]       Fax: [phone]   RxID:   [id number] HYDROCHLOROTHIAZIDE 25 MG TABS (HYDROCHLOROTHIAZIDE) Take 1 tablet by mouth once a day  #30 x 3   Entered and Authorized by:   Chauncey Reading DO   Signed by:   Chauncey Reading DO on 02/07/2009   Method used:   Electronically to        [facility] (retail)       [address]       [place], [place]  [address]       Ph: [phone]       Fax: [phone]   RxID:   [id number] LISINOPRIL 20 MG TABS (LISINOPRIL) Take 1 tablet by mouth once a day  #30 x 3   Entered and Authorized by:   Chauncey Reading DO   Signed by:   Chauncey Reading DO on 02/07/2009   Method used:   Electronically to        [facility] (retail)       [address]       [place], [place]  [address]       Ph: [phone]       Fax: [phone]   RxID:   [id number] ATENOLOL 100 MG TABS (ATENOLOL) Take 1 tablet by mouth once a day  #30 x 3   Entered and Authorized by:   Chauncey Reading DO   Signed by:   Chauncey Reading DO on 02/07/2009   Method used:   Electronically to        [facility] (retail)       [address]       [place], [place]  [address]       Ph: [phone]       Fax: [phone]   RxID:   [id number]

## 2011-01-21 NOTE — Assessment & Plan Note (Signed)
Summary: DM TEACHING/DS   Vital Signs:  Patient profile:   50 year old female Weight:      245.2 pounds BMI:     39.72 Is Patient Diabetic? Yes  CBG Device ID True Track   Allergies: 1)  ! Morphine Sulfate (Morphine Sulfate) 2)  ! * Iodinated Contrast   Complete Medication List: 1)  Atenolol 100 Mg Tabs (Atenolol) .... Take 1 tablet by mouth once a day 2)  Lisinopril 20 Mg Tabs (Lisinopril) .... Take 1 tablet by mouth once a day 3)  Hydrochlorothiazide 25 Mg Tabs (Hydrochlorothiazide) .... Take 1 tablet by mouth once a day 4)  Advair Diskus 250-50 Mcg/dose Misc (Fluticasone-salmeterol) .... One puff two times a day 5)  Tylenol Extra Strength 500 Mg Tabs (Acetaminophen) .... Take 1 tablet by mouth every 6 hours as needed for pain 6)  Plavix 75 Mg Tabs (Clopidogrel bisulfate) .... Take 1 tablet by mouth once a day 7)  Ra Pen Needles 31g X 8 Mm Misc (Insulin pen needle) .... Use 3 times per day to check the sugar level 8)  Lyrica 50 Mg Caps (Pregabalin) .... Take 1 capsule by mouth three times a day 9)  Amitriptyline Hcl 75 Mg Tabs (Amitriptyline hcl) .... Take 1 tablet by mouth two times a day 10)  Lyrica 100 Mg Caps (Pregabalin) .... Take 1 tablet by mouth three times a day 11)  Tramadol Hcl 50 Mg Tabs (Tramadol hcl) .... One tab up to four times a day for pain 12)  Doxycycline Hyclate 100 Mg Caps (Doxycycline hyclate) .... Take 1 tablet by mouth two times a day 13)  Paxil 20 Mg Tabs (Paroxetine hcl) .... Take 1 tablet by mouth once a day 14)  Novolin 70/30 70-30 % Susp (Insulin isophane & regular) .... Inject 110 units 30 minutes before morning meal and 75 units 30 minutes before evening meal  Other Orders: DSMT(Medicare) Individual, 30 Minutes (G0108)  Patient Instructions: 1)  Goals: 1- check blood sugar before injecting insulin 2)             2- injet insulin 30 minutes before meals 3)  Make an appointment for two weeks 4)  Remember progress - not prefection  Diabetes  Self Management Training  PCP: Mliss Sax MD Date diagnosed with diabetes: 09/21/1992 Diabetes Type: Type 2 insulin Other persons present: Other family- niece- Mariella Current smoking Status: current  Vital Signs Todays Weight: 245.2lb  in BMI 39.72in-lbs   Assessment Sources of Support: niece,  Special needs or Barriers: had gestational diabetes in 1980 # of people in household: 5  Potential Barriers  Economic/Supplies  Coping Skills  Coping with Diabetes Feelings about Diabetes: Denial, aware,  What bothers you  most about dealing with your diabetes? pain of self testing and injections  Diabetes Medications:  Comments: dinner at 7:30 - 8 PM, not tkaing insuln before meals- both times of dya tkaing insulin after meals- will likely need less insulin once tkaing it correctly and timing it with meals.    Mixed/Intermediate   Insulin Type:Novolin 70/30 Breakfast Dose: 120 depends on when she gets up 11-12 after she eats  Dinner Dose:85 between 10-11 pm    Monitoring Self monitoring blood glucose 2 times a day Name of Meter  True Track  Recent Episodes of: Requiring Help from another person  Hyperglycemia : Yes Hypoglycemia: No Severe Hypoglycemia : No     Estimated /Usual Carb Intake Breakfast # of Carbs/Grams skips because she is not awake  Lunch # of Carbs/Grams first meal 11-12 noon- likes chips, regular soda, fried foods Dinner # of Carbs/Grams second meal is 7-8 PM,  Fat: Excessive fat intake  Nutrition assessment What beverages do you drink?  regular pepesi- several /day Biggest challenge to eating healthy: Eating too much  Activity Limitations  Inadequate physical activity  Appropriate physical activity  Barriers  Physical limitations Diabetes Disease Process  Discussed today Define diabetes in simple terms: Needs review/assistanceState own type of diabetes: No knowledge   State diabetes is treated by meal  plan-exercise-medication-monitoring-education: Needs review/assistance Medications State name-action-dose-duration-side effects-and time to take medication: Needs review/assistance   State appropriate timing of food related to medication: Needs review/assistance   State insulin adjustment guidelines: Not applicable Nutritional Management Identify what foods most often affect blood glucose: Needs review/assistance    Verbalize importance of controlling food portions: Needs review/assistance   State importance of spacing and not omitting meals and snacks: Needs review/assistance    Monitoring State purpose and frequency of monitoring BG-ketones-HgbA1C  : Needs review/assistance   Perform glucose monitoring/ketone testing and record results correctly: Demonstrates competencyState target blood glucose and HgbA1C goals: Needs review/assistance    Complications State the causes-signs and symptoms and prevention of Hyperglycemia: Needs review/assistance   Explain proper treatment of hyperglycemia: Needs review/assistance    Exercise  Lifestyle changes:Goal setting and Problem solving State benefits of making appropriate lifestyle changes: Needs review/assistance   Identify lifestyle behaviors that need to change: Needs review/assistance   Identify risk factors that interfere with health: Needs review/assistance   Develop strategies to reduce risk factors: Needs review/assistance   Identify Family/SO role in managing diabetes: Needs review/assistance    Preconception care-Pregnancy-GDM management ( if applicable)                                                                      Not applicable  Psychosocial Adjustment State three common feelings that might be experienced when learning to cope with diabetes: Needs review/assistance   Identify two methods to cope with these feelings: Coping SkillsName two ways of obtaining support from family/friends: Needs review/assistance   Diabetes  Management Education Done: 10/10/2009    BEHAVIORAL GOALS INITIAL Utilizing medications if for therapeutic effectiveness: inject insulin 30 minutes before meals for next two weeks        Patient seemed calmer and more focused today than at previous visits. Verbalzing denial of diabetes interfering with self-care. " I am my own worst enemy"  Support and encouagement provided.  Diabetes Self Management Support: niece, clinic staff Follow-up:2 weeks

## 2011-01-21 NOTE — Progress Notes (Signed)
Summary: med refill/gp  Phone Note Refill Request Message from:  Pharmacy on September 21, 2009 10:05 AM  [facility] pharm. called for refill on Paxil 20mg  daily. I do see this med. on her current med list.  The pharm. said  Lexapro was originally orded;but they do not have it at the dept.  So, it was changed to Paxil.   Method Requested: Telephone to Pharmacy Initial call taken by: Chinita Pester RN,  September 21, 2009 10:11 AM  Follow-up for Phone Call        completed Follow-up by: Mliss Sax MD,  September 21, 2009 3:20 PM    New/Updated Medications: PAXIL 20 MG TABS (PAROXETINE HCL) Take 1 tablet by mouth once a day Prescriptions: PAXIL 20 MG TABS (PAROXETINE HCL) Take 1 tablet by mouth once a day  #30 x 3   Entered and Authorized by:   Mliss Sax MD   Signed by:   Mliss Sax MD on 09/24/2009   Method used:   Print then Give to Patient   RxID:   [id number]   Appended Document: med refill/gp Paxil Rx refill [facility] faxedto [facility] pharmacy.

## 2011-01-21 NOTE — Progress Notes (Signed)
Summary: refill/ hla  Phone Note Refill Request Message from:  Patient on May 25, 2009 4:10 PM  Refills Requested: Medication #1:  amitriptyline 50mg  1 tablet by mouth twice daily Initial call taken by: Marin Roberts RN,  May 25, 2009 4:12 PM  Follow-up for Phone Call        It is not clear to me if this pt is on amitriptyline. Appears that Dr Reynold Bowen saw pt on Friday and started Lyrica (note not yet complete). She is to see Dr Sondra Barges this afternoon and he can discuss with her.  Follow-up by: Ned Grace MD,  May 28, 2009 10:05 AM  Additional Follow-up for Phone Call Additional follow up Details #1::        Pt has continued taking amitriptyline but Dr. Aldine Contes removed it from pt's med list in 11/2008 (reason: other).. Lyrica was d/c'd in 08/2008 and restarted last Friday.  Additional Follow-up by: Olene Craven MD,  May 28, 2009 2:12 PM

## 2011-01-21 NOTE — Miscellaneous (Signed)
Summary: Orders Update VASCULAR SURGERY  REFERRAL  Clinical Lists Changes  Orders: Added new Referral order of Misc. Referral (Misc. Ref) - Signed

## 2011-01-21 NOTE — Letter (Signed)
Summary: [facility]: CMN  [facility]: CMN   Imported By: Florinda Marker 08/02/2008 14:56:08  _____________________________________________________________________  External Attachment:    Type:   Image     Comment:   External Document

## 2011-01-21 NOTE — Miscellaneous (Signed)
Summary: Medication Contract  Medication Contract   Imported By: Florinda Marker 05/31/2008 15:54:36  _____________________________________________________________________  External Attachment:    Type:   Image     Comment:   External Document

## 2011-01-21 NOTE — Consult Note (Signed)
Summary: [facility]  [facility]   Imported By: Louretta Parma 10/03/2010 15:05:58  _____________________________________________________________________  External Attachment:    Type:   Image     Comment:   External Document

## 2011-01-21 NOTE — Assessment & Plan Note (Signed)
Summary: fell 1 mon ago, cont to have back pain/pcp-magick/hla   Vital Signs:  Patient profile:   50 year old female Height:      66 inches Weight:      242.5 pounds BMI:     39.28 Temp:     97.2 degrees F oral Pulse rate:   80 / minute BP sitting:   100 / 80  (right arm)  Vitals Entered By: Filomena Jungling NT II (September 14, 2009 2:30 PM) CC: backi pain and arm jerking, x1 month Is Patient Diabetic? Yes  Pain Assessment Patient in pain? yes     Location: back Intensity: 8 Type: aching Onset of pain  Intermittent CBG Result 380  Have you ever been in a relationship where you felt threatened, hurt or afraid?No   Does patient need assistance? Functional Status Self care Ambulation Normal   Primary Care Provider:  Mliss Sax MD  CC:  backi pain and arm jerking and x1 month.  History of Present Illness: Pt is a 50 yo AAF with PMH of DM, morbid obesity, increased LFT due to use of statin, HTN, HLD and depression came here for right hip pain after a fall. She had a fall about 3 weeks ago and hit her tailbone, then has constant pain, about 10/10, no radiation or weakness, movement makes it worse and has tried tylenol and motrin, but not help much. She has no other c/o, including fever, chill, SOB or CP. She has good appetite, is  a current smoker, 1/2 PPD, no ETOH or drug abuse. Denies SI/HI. She has no diarrhea or dysuria, no N/V or abdominal pain.   Depression History:      The patient denies a depressed mood most of the day and a diminished interest in her usual daily activities.         Preventive Screening-Counseling & Management  Alcohol-Tobacco     Alcohol drinks/day: 0     Smoking Status: current     Smoking Cessation Counseling: yes     Packs/Day: 1.0     Year Started: years     Passive Smoke Exposure: yes  Caffeine-Diet-Exercise     Does Patient Exercise: no  Problems Prior to Update: 1)  Boils, Recurrent  (ICD-680.9) 2)  Diab W/neuro Manifests Type Ii/uns  Not Uncntrl  (ICD-250.60) 3)  Depression  (ICD-311) 4)  Diabetic Foot Ulcer, Right  (ICD-250.80) 5)  Claudication  (ICD-443.9) 6)  Dm W/manifestation Nec, Type II, Uncontrolled  (ICD-250.82) 7)  Tobacco Abuse  (ICD-305.1) 8)  COPD  (ICD-496) 9)  Hyperlipidemia Nec/nos  (ICD-272.4) 10)  Transaminases, Serum, Elevated  (ICD-790.4) 11)  Diabetic Peripheral Neuropathy  (ICD-250.60) 12)  Hypertension, Benign Essential  (ICD-401.1) 13)  Knee Pain  (ICD-719.46) 14)  Fracture, Toe, Left  (ICD-826.0) 15)  Diabetic Foot Ulcer, Left  (ICD-250.80) 16)  Inadequate Material Resources  (ICD-V60.2) 17)  Nausea  (ICD-787.02) 18)  Weight Loss  (ICD-783.21) 19)  Dysphagia Unspecified  (ICD-787.20) 20)  Gastroesophageal Reflux Disease  (ICD-530.81) 21)  Ulcer of Other Part of Foot  (ICD-707.15) 22)  Other&unspecified Diseases The Oral Soft Tissues  (ICD-528.9) 23)  Abscess, Skin  (ICD-682.9) 24)  Bell's Palsy  (ICD-351.0) 25)  Back Pain  (ICD-724.5) 26)  Health Screening  (ICD-V70.0) 27)  Abscess, Perirectal, Hx of  (ICD-V13.3) 28)  Renal Failure, Acute, Hx of  (ICD-V13.09)  Medications Prior to Update: 1)  Atenolol 100 Mg Tabs (Atenolol) .... Take 1 Tablet By Mouth Once A Day 2)  Lisinopril 20 Mg Tabs (Lisinopril) .... Take 1 Tablet By Mouth Once A Day 3)  Hydrochlorothiazide 25 Mg Tabs (Hydrochlorothiazide) .... Take 1 Tablet By Mouth Once A Day 4)  Advair Diskus 250-50 Mcg/dose Misc (Fluticasone-Salmeterol) .... One Puff Two Times A Day 5)  Tylenol Extra Strength 500 Mg Tabs (Acetaminophen) .... Take 1 Tablet By Mouth Every 6 Hours As Needed For Pain 6)  Pravachol 20 Mg  Tabs (Pravastatin Sodium) .... Take 1 Tab By Mouth At Bedtime 7)  Plavix 75 Mg  Tabs (Clopidogrel Bisulfate) .... Take 1 Tablet By Mouth Once A Day 8)  Ra Pen Needles 31g X 8 Mm Misc (Insulin Pen Needle) .... Use 3 Times Per Day To Check The Sugar Level 9)  Lyrica 50 Mg Caps (Pregabalin) .... Take 1 Capsule By Mouth Three Times A  Day 10)  Amitriptyline Hcl 75 Mg Tabs (Amitriptyline Hcl) .... Take 1 Tablet By Mouth Two Times A Day 11)  Lyrica 100 Mg Caps (Pregabalin) .... Take 1 Tablet By Mouth Three Times A Day 12)  Tramadol Hcl 50 Mg Tabs (Tramadol Hcl) .... One Tab Up To Four Times A Day For Pain 13)  Doxycycline Hyclate 100 Mg Caps (Doxycycline Hyclate) .... Take 1 Tablet By Mouth Two Times A Day 14)  Novolog Mix 70/30 70-30 % Susp (Insulin Aspart Prot & Aspart) .Marland Kitchen.. 120 Units in Am and 65 Units Pm Sub-Cutaneous.  Current Medications (verified): 1)  Atenolol 100 Mg Tabs (Atenolol) .... Take 1 Tablet By Mouth Once A Day 2)  Lisinopril 20 Mg Tabs (Lisinopril) .... Take 1 Tablet By Mouth Once A Day 3)  Hydrochlorothiazide 25 Mg Tabs (Hydrochlorothiazide) .... Take 1 Tablet By Mouth Once A Day 4)  Advair Diskus 250-50 Mcg/dose Misc (Fluticasone-Salmeterol) .... One Puff Two Times A Day 5)  Tylenol Extra Strength 500 Mg Tabs (Acetaminophen) .... Take 1 Tablet By Mouth Every 6 Hours As Needed For Pain 6)  Plavix 75 Mg  Tabs (Clopidogrel Bisulfate) .... Take 1 Tablet By Mouth Once A Day 7)  Ra Pen Needles 31g X 8 Mm Misc (Insulin Pen Needle) .... Use 3 Times Per Day To Check The Sugar Level 8)  Lyrica 50 Mg Caps (Pregabalin) .... Take 1 Capsule By Mouth Three Times A Day 9)  Amitriptyline Hcl 75 Mg Tabs (Amitriptyline Hcl) .... Take 1 Tablet By Mouth Two Times A Day 10)  Lyrica 100 Mg Caps (Pregabalin) .... Take 1 Tablet By Mouth Three Times A Day 11)  Tramadol Hcl 50 Mg Tabs (Tramadol Hcl) .... One Tab Up To Four Times A Day For Pain 12)  Doxycycline Hyclate 100 Mg Caps (Doxycycline Hyclate) .... Take 1 Tablet By Mouth Two Times A Day 13)  Novolog Mix 70/30 70-30 % Susp (Insulin Aspart Prot & Aspart) .Marland Kitchen.. 120 Units in Am and 65 Units Pm Sub-Cutaneous.  Allergies (verified): 1)  ! Morphine Sulfate (Morphine Sulfate) 2)  ! * Iodinated Contrast  Past History:  Past Medical History: Last updated: 11/24/2008 Current  Problems:  RENAL FAILURE, ACUTE, HX OF (ICD-V13.09) TOBACCO ABUSE (ICD-305.1)     COPD (ICD-496) HYPERLIPIDEMIA NEC/NOS (ICD-272.4)     TRANSAMINASES, SERUM, ELEVATED (ICD-790.4) Diabetes mellitus type II     DIABETIC PERIPHERAL NEUROPATHY (ICD-250.60)     DIABETIC FOOT ULCER, TOE (ICD-250.80) HYPERTENSION, BENIGN ESSENTIAL (ICD-401.1)  Past Surgical History: Last updated: 01/08/2007 PTCA/stent, left iliac 2005 R shoulder arthroscropy/debridement Hysterectomy, 1997 due to fibroids  Social History: Last updated: 03/28/2009 Lives with daughter-in-law in apartment, currently  unemployed  Risk Factors: Alcohol Use: 0 (09/14/2009) Exercise: no (09/14/2009)  Risk Factors: Smoking Status: current (09/14/2009) Packs/Day: 1.0 (09/14/2009) Passive Smoke Exposure: yes (09/14/2009)  Family History: Reviewed history from 06/22/2008 and no changes required. Mother  DM2, CHF, living Father asthma, living Brothers and sisters asthma since childhood  Social History: Reviewed history from 03/28/2009 and no changes required. Lives with daughter-in-law in apartment, currently unemployed  Review of Systems  The patient denies anorexia, fever, weight loss, vision loss, decreased hearing, chest pain, syncope, dyspnea on exertion, peripheral edema, prolonged cough, headaches, abdominal pain, and melena.    Physical Exam  General:  alert, well-developed, well-nourished, well-hydrated, and overweight-appearing.   Head:  normocephalic.   Eyes:  vision grossly intact, pupils equal, pupils round, and pupils reactive to light.   Ears:  no external deformities.   Nose:  no external erythema.   Mouth:  pharynx pink and moist.   Neck:  supple and full ROM.   Lungs:  normal respiratory effort, normal breath sounds, no crackles, and no wheezes.   Heart:  normal rate, regular rhythm, no murmur, and no JVD.   Abdomen:  soft, non-tender, normal bowel sounds, no distention, and no masses.   Msk:   normal ROM, no joint tenderness, no joint swelling, no joint warmth, and no redness over joints.  There is mild tenderness over her tailbone area and area right to her tailbone, but no redness , swelling or bruising.   Pulses:  2+ Extremities:  No edema.  Neurologic:  alert & oriented X3, cranial nerves II-XII intact, strength normal in all extremities, sensation intact to light touch, gait normal, and DTRs symmetrical and normal.     Impression & Recommendations:  Problem # 1:  DM W/MANIFESTATION NEC, TYPE II, UNCONTROLLED (ICD-250.82) Assessment Unchanged Her A1cstill not at the goal and home CBG usually runs about 200, sometimes above 500. Have asked her to bring her glucometer here so that we can know how her CBG controlled. Also want her to have DM referral to [name] for her to better understand how to control her DM.  Her updated medication list for this problem includes:    Lisinopril 20 Mg Tabs (Lisinopril) .Marland Kitchen... Take 1 tablet by mouth once a day    Novolog Mix 70/30 70-30 % Susp (Insulin aspart prot & aspart) .Marland Kitchen... 120 units in am and 65 units pm sub-cutaneous.  Orders: Diabetic Clinic Referral (Diabetic)  Labs Reviewed: Creat: 0.93 (08/13/2009)     Last Eye Exam: No diabetic retinopathy.    (05/31/2008) Reviewed HgBA1c results: 9.8 (09/14/2009)  9.9 (07/13/2009)  Problem # 2:  HYPERTENSION, BENIGN ESSENTIAL (ICD-401.1) Assessment: Improved HerBP at goal, will continue current meds. Check CMET.  Her updated medication list for this problem includes:    Atenolol 100 Mg Tabs (Atenolol) .Marland Kitchen... Take 1 tablet by mouth once a day    Lisinopril 20 Mg Tabs (Lisinopril) .Marland Kitchen... Take 1 tablet by mouth once a day    Hydrochlorothiazide 25 Mg Tabs (Hydrochlorothiazide) .Marland Kitchen... Take 1 tablet by mouth once a day  Orders: T-Comprehensive Metabolic Panel ([id number])  BP today: 100/80 Prior BP: 128/82 (08/13/2009)  Labs Reviewed: K+: 4.1 (08/13/2009) Creat: : 0.93 (08/13/2009)    Chol: 171 (11/24/2008)   HDL: 30 (11/24/2008)   LDL: 79 (11/24/2008)   TG: 310 (11/24/2008)  Problem # 3:  TRANSAMINASES, SERUM, ELEVATED (ICD-790.4) Assessment: Comment Only Will check CMET for LFT after holding statin. She has several years of slightly high LFT, may be  due to statin side effects, fatty liver per abdominal US, but hepatitis is less likely as negative HBV and HCV. If there is no significant change or even increase after stopping statin, then fatty liver is likely, need to restart statin.  Orders: T-Comprehensive Metabolic Panel ([id number])  Problem # 4:  HIP PAIN, RIGHT (ICD-719.45) Assessment: New She has a recent fall and hit tailbone, has constant pain over that area, but physical exam did not show remarkable change except mild tenderness. Will have sacrocococcyx to r/o any fractur. Suggested her to continue to take pain meds while applying heating pad or cold.   Her updated medication list for this problem includes:    Tylenol Extra Strength 500 Mg Tabs (Acetaminophen) .Marland Kitchen... Take 1 tablet by mouth every 6 hours as needed for pain    Tramadol Hcl 50 Mg Tabs (Tramadol hcl) ..... One tab up to four times a day for pain  Orders: Diagnostic X-Ray/Fluoroscopy (Diagnostic X-Ray/Flu)  Discussed use of medications, application of heat or cold, and exercises.   Problem # 5:  Preventive Health Care (ICD-V70.0) Assessment: Comment Only Her mammogram is due, will order it.   Complete Medication List: 1)  Atenolol 100 Mg Tabs (Atenolol) .... Take 1 tablet by mouth once a day 2)  Lisinopril 20 Mg Tabs (Lisinopril) .... Take 1 tablet by mouth once a day 3)  Hydrochlorothiazide 25 Mg Tabs (Hydrochlorothiazide) .... Take 1 tablet by mouth once a day 4)  Advair Diskus 250-50 Mcg/dose Misc (Fluticasone-salmeterol) .... One puff two times a day 5)  Tylenol Extra Strength 500 Mg Tabs (Acetaminophen) .... Take 1 tablet by mouth every 6 hours as needed for pain 6)  Plavix 75 Mg Tabs  (Clopidogrel bisulfate) .... Take 1 tablet by mouth once a day 7)  Ra Pen Needles 31g X 8 Mm Misc (Insulin pen needle) .... Use 3 times per day to check the sugar level 8)  Lyrica 50 Mg Caps (Pregabalin) .... Take 1 capsule by mouth three times a day 9)  Amitriptyline Hcl 75 Mg Tabs (Amitriptyline hcl) .... Take 1 tablet by mouth two times a day 10)  Lyrica 100 Mg Caps (Pregabalin) .... Take 1 tablet by mouth three times a day 11)  Tramadol Hcl 50 Mg Tabs (Tramadol hcl) .... One tab up to four times a day for pain 12)  Doxycycline Hyclate 100 Mg Caps (Doxycycline hyclate) .... Take 1 tablet by mouth two times a day 13)  Novolog Mix 70/30 70-30 % Susp (Insulin aspart prot & aspart) .Marland Kitchen.. 120 units in am and 65 units pm sub-cutaneous.  Other Orders: T-Hgb A1C (in-house) ([id number]) T- Capillary Blood Glucose ([id number]) T-Urine Microalbumin w/creat. ratio [phone]) Mammogram (Screening) (Mammo)  Patient Instructions: 1)  Please schedule a follow-up appointment in 2-3 weeks weeks. 2)  We will call you about any abnormal lab works.  3)  Please see Ms. [name] for diabetic education to better control your diabetes.  Prescriptions: AMITRIPTYLINE HCL 75 MG TABS (AMITRIPTYLINE HCL) Take 1 tablet by mouth two times a day  #60 x 3   Entered and Authorized by:   Jackson Latino MD   Signed by:   Jackson Latino MD on 09/14/2009   Method used:   Print then Give to Patient   RxID:   [id number]   Process Orders Check Orders Results:     [facility]: ABN not required for this insurance Tests Sent for requisitioning (September 14, 2009 9:50 PM):     09/14/2009: [facility] -- T-Urine Microalbumin w/creat. ratio [[id number]] (signed)     09/14/2009: [facility] -- T-Comprehensive Metabolic Panel [phone] (signed)    Prevention & Chronic Care Immunizations   Influenza vaccine: Not documented   Influenza vaccine deferral:  Refused  (09/14/2009)    Tetanus booster: Not documented    Pneumococcal vaccine: Not documented  Colorectal Screening   Hemoccult: Not documented    Colonoscopy: Not documented   Colonoscopy action/deferral: Deferred  (09/14/2009)  Other Screening   Pap smear: Normal  (08/17/2001)   Pap smear action/deferral: Deferred  (09/14/2009)    Mammogram: No specific mammographic evidence of malignancy.  Assessment: BIRADS 1.   (04/11/2008)   Mammogram action/deferral: Ordered  (09/14/2009)   Mammogram due: 04/2009   Smoking status: current  (09/14/2009)   Smoking cessation counseling: yes  (09/14/2009)  Diabetes Mellitus   HgbA1C: 9.8  (09/14/2009)   HgbA1C action/deferral: Ordered  (09/14/2009)    Eye exam: No diabetic retinopathy.     (05/31/2008)   Eye exam due: 05/2009    Foot exam: yes  (07/13/2009)   High risk foot: Not documented   Foot care education: Not documented    Urine microalbumin/creatinine ratio: 1.7  (09/29/2007)   Urine microalbumin action/deferral: Ordered    Diabetes flowsheet reviewed?: Yes   Progress toward A1C goal: Unchanged  Lipids   Total Cholesterol: 171  (11/24/2008)   LDL: 79  (11/24/2008)   LDL Direct: Not documented   HDL: 30  (11/24/2008)   Triglycerides: 310  (11/24/2008)    SGOT (AST): 59  (08/15/2009)   SGPT (ALT): 64  (08/15/2009) CMP ordered    Alkaline phosphatase: 87  (08/15/2009)   Total bilirubin: 0.4  (08/15/2009)    Lipid flowsheet reviewed?: Yes   Progress toward LDL goal: At goal  Hypertension   Last Blood Pressure: 100 / 80  (09/14/2009)   Serum creatinine: 0.93  (08/13/2009)   Serum potassium 4.1  (08/13/2009) CMP ordered     Hypertension flowsheet reviewed?: Yes   Progress toward BP goal: At goal  Self-Management Support :   Personal Goals (by the next clinic visit) :     Personal A1C goal: 7  (09/14/2009)     Personal blood pressure goal: 130/80  (09/14/2009)     Personal LDL goal: 100  (09/14/2009)     Diabetes self-management support: Written self-care plan  (09/14/2009)   Diabetes care plan printed   Referred for diabetes self-mgmt training.    Hypertension self-management support: Written self-care plan  (09/14/2009)   Hypertension self-care plan printed.    Lipid self-management support: Written self-care plan  (09/14/2009)   Lipid self-care plan printed.   Nursing Instructions: HgbA1C today (see order) CBG today (see order) Schedule screening mammogram (see order)   Laboratory Results   Blood Tests   Date/Time Received: September 14, 2009 2:57 PM Date/Time Reported: Alric Quan  September 14, 2009 2:57 PM  HGBA1C: 9.8%   (Normal Range: Non-Diabetic - 3-6%   Control Diabetic - 6-8%) CBG Random:: 380mg /dL      Process Orders Check Orders Results:     [facility]: ABN not required for this insurance Tests Sent for requisitioning (September 14, 2009 9:50 PM):     09/14/2009: [facility] -- T-Urine Microalbumin w/creat. ratio [[id number]] (signed)     09/14/2009: [facility] -- T-Comprehensive Metabolic Panel [phone] (signed)

## 2011-01-21 NOTE — Progress Notes (Signed)
Summary: phone/gg  Phone Note From Pharmacy   Caller: [facility] Summary of Call: Health dept called and states the recommonded dose of lidoderm patchesis 12 hours on and then off. Do you want the pt to wear for 24 hours?  You wrote LIDODERM 5 % PTCH (LIDOCAINE) Apply two times a day in the affected area. please clarify. Initial call taken by: Merrie Roof RN,  February 19, 2010 4:44 PM  Follow-up for Phone Call        12 hours on and then 12 hours off; I will clarify her prescription. Thank you..    Prescriptions: LIDODERM 5 % PTCH (LIDOCAINE) Apply two times a day in the affected area.  #30 x 1   Entered by:   Mliss Sax MD   Authorized by:   Vassie Loll MD   Signed by:   Mliss Sax MD on 02/20/2010   Method used:   Faxed to ...       [facility] Department (retail)       [address] [place], [place]  [address]       Ph: [phone]       Fax: [phone]   RxID:   [id number]   Appended Document: phone/gg Prescriptions: LIDODERM 5 % PTCH (LIDOCAINE) Apply to the affected area, 12 hours on and 12 hours off  #30 x 3   Entered and Authorized by:   Mliss Sax MD   Signed by:   Mliss Sax MD on 02/20/2010   Method used:   Faxed to ...       [facility] Department (retail)       [address] [place], [place]  [address]       Ph: [phone]       Fax: [phone]   RxID:   [id number]

## 2011-01-21 NOTE — Medication Information (Signed)
Summary: PERCOCET  PERCOCET   Imported By: Margie Billet 10/07/2010 14:09:31  _____________________________________________________________________  External Attachment:    Type:   Image     Comment:   External Document

## 2011-01-21 NOTE — Progress Notes (Signed)
Summary: Refill/gh  Phone Note Refill Request Message from:  Fax from Pharmacy on November 25, 2010 11:52 AM  Refills Requested: Medication #1:  TRAZODONE HCL 100 MG TABS take 1/2 tablet at bedtime as needed for insomnia   Last Refilled: 10/24/2010  Method Requested: Fax to Local Pharmacy Initial call taken by: Angelina Ok RN,  November 25, 2010 11:53 AM  Follow-up for Phone Call        completed refill, thank you Jeralynn Vaquera  Follow-up by: Mliss Sax MD,  November 26, 2010 3:49 PM    Prescriptions: TRAZODONE HCL 100 MG TABS (TRAZODONE HCL) take 1/2 tablet at bedtime as needed for insomnia  #15 x 5   Entered by:   Mliss Sax MD   Authorized by:   Zoila Shutter MD   Signed by:   Mliss Sax MD on 11/26/2010   Method used:   Electronically to        [facility] (retail)       [address]       [place], [place]  [address]       Ph: [phone]       Fax: [phone]   RxID:   [id number] TRAZODONE HCL 100 MG TABS (TRAZODONE HCL) take 1/2 tablet at bedtime as needed for insomnia  #15 x 0   Entered and Authorized by:   Zoila Shutter MD   Signed by:   Zoila Shutter MD on 11/25/2010   Method used:   Electronically to        [facility] (retail)       [address]       [place], [place]  [address]       Ph: [phone]       Fax: [phone]   RxID:   [id number]

## 2011-01-21 NOTE — Letter (Signed)
Summary: Pre Visit Letter Revised  [facility]  [address] [place], [place] [address]   Phone: [phone]  Fax: [phone]        11/05/2010 MRN: [id number] Jennifer [name] [address] [place], [place]  [address]             Procedure Date:  11-18-10   Welcome to the Gastroenterology Division at [facility].    You are scheduled to see a nurse for your pre-procedure visit on 11-08-10 at 9:00a.m. on the 3rd floor at [facility], [address]  We ask that you try to arrive at our office 15 minutes prior to your appointment time to allow for check-in.  Please take a minute to review the attached form.  If you answer "Yes" to one or more of the questions on the first page, we ask that you call the person listed at your earliest opportunity.  If you answer "No" to all of the questions, please complete the rest of the form and bring it to your appointment.    Your nurse visit will consist of discussing your medical and surgical history, your immediate family medical history, and your medications.   If you are unable to list all of your medications on the form, please bring the medication bottles to your appointment and we will list them.  We will need to be aware of both prescribed and over the counter drugs.  We will need to know exact dosage information as well.    Please be prepared to read and sign documents such as consent forms, a financial agreement, and acknowledgement forms.  If necessary, and with your consent, a friend or relative is welcome to sit-in on the nurse visit with you.  Please bring your insurance card so that we may make a copy of it.  If your insurance requires a referral to see a specialist, please bring your referral form from your primary care physician.  No co-pay is required for this nurse visit.     If you cannot keep your appointment, please call [phone] to cancel or reschedule prior to your appointment date.  This  allows [place] the opportunity to schedule an appointment for another patient in need of care.    Thank you for choosing [facility] for your medical needs.  We appreciate the opportunity to care for you.  Please visit [place] at our website  to learn more about our practice.  Sincerely, The Gastroenterology Division

## 2011-01-21 NOTE — Progress Notes (Signed)
  Phone Note Outgoing Call   Call placed by: Social Work Call placed to: Patient Summary of Call: Appmt set for January 4th at 10 AM.

## 2011-01-21 NOTE — Letter (Signed)
Summary: EXTEND LOG BOOK REPORT  EXTEND LOG BOOK REPORT   Imported By: Margie Billet 05/09/2010 11:17:32  _____________________________________________________________________  External Attachment:    Type:   Image     Comment:   External Document

## 2011-01-21 NOTE — Progress Notes (Signed)
Summary: Pain  Phone Note Call from Patient   Caller: Patient Call For: Peggye Pitt MD Summary of Call: Msg left by pt that she had [facility] the [facility].  Now is having increased pain.  Has leftmessages at the Pain Clinic.  No return call as of this afternoon.  Pt would like to get something for her pain if possible. Pt called again stating that the Pain Clinic told her that it would take 3 to 4 days before anyone could get back to her.  She says that she needs something for pain as soon as possible.  Can be reached at [phone] work # is [phone]. ..................................................................Marland KitchenAngelina Ok RN  February 18, 2008 2:58 PM  Initial call taken by: Angelina Ok RN,  February 17, 2008 4:39 PM  Follow-up for Phone Call        Called pt. to see how her pain is  doing this  morning. States she called the Pain Ctr.yesterday about 1100A.M. and have not heard back from them .  States pain level is #10.  Follow-up by: Chinita Pester RN,  February 18, 2008 10:21 AM  Additional Follow-up for Phone Call Additional follow up Details #1::        Will call patient. However, since she is now going to the pain clinic, prescribing anything for her would be violating her pain contract there. Additional Follow-up by: Peggye Pitt MD,  February 18, 2008 3:17 PM    Additional Follow-up for Phone Call Additional follow up Details #2::    Just spoke with [name]. She would like an appointment for early next week to discuss her pain. Please arrange. Follow-up by: Peggye Pitt MD,  February 18, 2008 3:22 PM  Additional Follow-up for Phone Call Additional follow up Details #3:: Details for Additional Follow-up Action Taken: Pt was given an appointment with Dr. Ardyth Harps for 03/01/08.  Pt aware. Additional Follow-up by: Angelina Ok RN,  February 23, 2008 3:46 PM

## 2011-01-21 NOTE — Progress Notes (Signed)
Summary: contnued problems with metformin/dmr  Phone Note Call from Patient Call back at Home Phone [phone]   Caller: Patient Summary of Call: metformin still bothering her- diarrhea all day  has been taing 1/2 the 1000mg  tablet with food at breakfast and dinner. agreed to take 1/2 tablet one time a day and call back Friday if still not tolerating. At that point recommend she be hcged to 500 mg gtablets with instructions to cut in half and take 250 mg with breakfast and 250 mg with dinner. patient verbalized understanding and appreciation. Initial call taken by: [name] RD,CDE,  February 12, 2010 11:43 AM  Follow-up for Phone Call        Agree with plan and will continue to monitor Follow-up by: Mliss Sax MD,  February 20, 2010 9:59 AM

## 2011-01-21 NOTE — Assessment & Plan Note (Signed)
Summary: pain on left start about  a year[mkj]   Vital Signs:  Patient profile:   50 year old female Height:      66 inches (167.64 cm) Weight:      249.0 pounds (112 kg) BMI:     40.33 Temp:     97.1 degrees F (36.17 degrees C) oral Pulse rate:   72 / minute BP sitting:   100 / 81  (left arm) Cuff size:   regular  Vitals Entered By: Theotis Barrio NT II (November 23, 2009 2:41 PM) CC: BURNING PAIN LEFT SIDE ABOVE BREAST/ THIS HAS BEEN ONGOING FOR MORE THAN A YEAR,  Is Patient Diabetic? Yes Did you bring your meter with you today? No Pain Assessment Patient in pain? yes      Nutritional Status BMI of > 30 = obese CBG Result 160  Have you ever been in a relationship where you felt threatened, hurt or afraid?No   Does patient need assistance? Functional Status Self care Ambulation Normal Comments BURNING PAIN LEFT SIDE ABOVE BREAST/ THIS HAS BEEN ONGING FOR MORE THAN A YEAR.  / SLEEPING MEDICATION IS NO LONGER WORKING- STILL UNABLE TO SLEEP   Primary Care Provider:  Mliss Sax MD  CC:  BURNING PAIN LEFT SIDE ABOVE BREAST/ THIS HAS BEEN ONGOING FOR MORE THAN A YEAR and .  History of Present Illness: 50 yrs old with multiple medical problems presents with complain of chest pain. Pain is described in a/p. She also requests medicine refills.   Depression History:      The patient denies a depressed mood most of the day and a diminished interest in her usual daily activities.         Preventive Screening-Counseling & Management  Alcohol-Tobacco     Alcohol drinks/day: 0     Smoking Status: current     Smoking Cessation Counseling: yes     Packs/Day: .5     Year Started: years     Passive Smoke Exposure: yes  Caffeine-Diet-Exercise     Does Patient Exercise: no  Current Medications (verified): 1)  Atenolol 100 Mg Tabs (Atenolol) .... Take 1 Tablet By Mouth Once A Day 2)  Lisinopril 20 Mg Tabs (Lisinopril) .... Take 1 Tablet By Mouth Once A Day 3)   Hydrochlorothiazide 25 Mg Tabs (Hydrochlorothiazide) .... Take 1 Tablet By Mouth Once A Day 4)  Advair Diskus 250-50 Mcg/dose Misc (Fluticasone-Salmeterol) .... One Puff Two Times A Day 5)  Tylenol Extra Strength 500 Mg Tabs (Acetaminophen) .... Take 1 Tablet By Mouth Every 6 Hours As Needed For Pain 6)  Plavix 75 Mg  Tabs (Clopidogrel Bisulfate) .... Take 1 Tablet By Mouth Once A Day 7)  Ra Pen Needles 31g X 8 Mm Misc (Insulin Pen Needle) .... Use 3 Times Per Day To Check The Sugar Level 8)  Lyrica 50 Mg Caps (Pregabalin) .... Take 1 Capsule By Mouth Three Times A Day 9)  Amitriptyline Hcl 75 Mg Tabs (Amitriptyline Hcl) .... Take 1 Tablet By Mouth Two Times A Day 10)  Lyrica 100 Mg Caps (Pregabalin) .... Take 1 Tablet By Mouth Three Times A Day 11)  Tramadol Hcl 50 Mg Tabs (Tramadol Hcl) .... One Tab Up To Four Times A Day For Pain 12)  Doxycycline Hyclate 100 Mg Caps (Doxycycline Hyclate) .... Take 1 Tablet By Mouth Two Times A Day 13)  Humalog Mix 75/25 Pen 75-25 % Susp (Insulin Lispro Prot & Lispro) .... Inject Under The Skin 110 Units  in The Am and 55 Units in The Evening Every Day 14)  Diflucan 150 Mg Tabs (Fluconazole) .... Take 1 Tablet Today and Then Stop 15)  Ambien 10 Mg Tabs (Zolpidem Tartrate) .... Take 1 Tablet Once At Bedtime As Needed For Insomnia 16)  Truetrack Test  Strp (Glucose Blood) .... Use To Check Blood Sugar Twice Daily 17)  Lancets  Misc (Lancets) .... Use To Check Blood Sugar Twice Daily 18)  Lexapro 10 Mg Tabs (Escitalopram Oxalate) .Marland Kitchen.. 1 Tablet By Mouth Every Morning  Allergies (verified): 1)  ! Morphine Sulfate (Morphine Sulfate) 2)  ! * Iodinated Contrast  Past History:  Past Medical History: Last updated: 11/24/2008 Current Problems:  RENAL FAILURE, ACUTE, HX OF (ICD-V13.09) TOBACCO ABUSE (ICD-305.1)     COPD (ICD-496) HYPERLIPIDEMIA NEC/NOS (ICD-272.4)     TRANSAMINASES, SERUM, ELEVATED (ICD-790.4) Diabetes mellitus type II     DIABETIC PERIPHERAL  NEUROPATHY (ICD-250.60)     DIABETIC FOOT ULCER, TOE (ICD-250.80) HYPERTENSION, BENIGN ESSENTIAL (ICD-401.1)  Past Surgical History: Last updated: 01/08/2007 PTCA/stent, left iliac 2005 R shoulder arthroscropy/debridement Hysterectomy, 1997 due to fibroids  Family History: Last updated: 11/23/2009 Mother  DM2, CHF, living Father asthma, living Brothers and sisters asthma since childhood Younger sister had heart attack at age of 28, since then she is doing well.   Social History: Last updated: 03/28/2009 Lives with daughter-in-law in apartment, currently unemployed  Risk Factors: Alcohol Use: 0 (11/23/2009) Exercise: no (11/23/2009)  Risk Factors: Smoking Status: current (11/23/2009) Packs/Day: .5 (11/23/2009) Passive Smoke Exposure: yes (11/23/2009)  Family History: Mother  DM2, CHF, living Father asthma, living Brothers and sisters asthma since childhood Younger sister had heart attack at age of 50, since then she is doing well.   Social History: Packs/Day:  .5  Review of Systems      See HPI  Physical Exam  General:  Well-developed,well-nourished,in no acute distress; alert,appropriate and cooperative throughout examination Head:  normocephalic.   Eyes:  vision grossly intact, pupils equal, pupils round, and pupils reactive to light.   Ears:  no external deformities.   Nose:  no external erythema.   Mouth:  pharynx pink and moist.   Neck:  No deformities, masses, or tenderness noted. Chest Wall:  no deformities, no tenderness, and no mass.   Lungs:  Normal respiratory effort, chest expands symmetrically. Lungs are clear to auscultation, no crackles or wheezes. Heart:  Normal rate and regular rhythm. S1 and S2 normal without gallop, murmur, click, rub or other extra sounds. Abdomen:  Bowel sounds positive,abdomen soft and non-tender without masses, organomegaly or hernias noted. Msk:  No deformity or scoliosis noted of thoracic or lumbar spine.   Neurologic:   No cranial nerve deficits noted. Station and gait are normal. Plantar reflexes are down-going bilaterally. DTRs are symmetrical throughout. Sensory, motor and coordinative functions appear intact. Skin:  turgor normal.   Psych:  Cognition and judgment appear intact. Alert and cooperative with normal attention span and concentration. No apparent delusions, illusions, hallucinations   Impression & Recommendations:  Problem # 1:  INSOMNIA (ICD-780.52) Persistant. I am unsure if there is sleep hyegeine issue or chest pain is keeping her awake.  Her updated medication list for this problem includes:    Ambien 10 Mg Tabs (Zolpidem tartrate) .Marland Kitchen... Take 1 tablet once at bedtime as needed for insomnia  Problem # 2:  DIAB W/NEURO MANIFESTS TYPE II/UNS NOT UNCNTRL (ICD-250.60) No changes made in diabetic regimen.  Her updated medication list for this problem includes:  Lisinopril 20 Mg Tabs (Lisinopril) .Marland Kitchen... Take 1 tablet by mouth once a day    Humalog Mix 75/25 Pen 75-25 % Susp (Insulin lispro prot & lispro) ..... Inject under the skin 110 units in the am and 55 units in the evening every day  Labs Reviewed: Creat: 0.90 (09/14/2009)     Last Eye Exam: No diabetic retinopathy.    (05/31/2008) Reviewed HgBA1c results: 10.2 (10/25/2009)  9.8 (09/14/2009)  Orders: Cardiology Referral (Cardiology)  Problem # 3:  TOBACCO ABUSE (ICD-305.1) ongoing, working on [employer]. NOt ready to quit.  Orders: Cardiology Referral (Cardiology)  Problem # 4:  CHEST PAIN (ICD-786.50) Assessment: New  Chronic chest pain that was episodic, at left border of sternum, sharp, graded 10/10 when present, non rediating, related with exertion in very high risk patient (DM, HTN, vasculopathy, smoking, family hx of premature CAD) is concerning for angina. I would get EKG. She has EKG from a year ago. I will start her on full dose aspirin and give her one now. She also is on plavix for her stents in leg. I have urged her  to stop smoking. I will also refer her to cardiologist.   Orders: Cardiology Referral (Cardiology)  Problem # 5:  HYPERTENSION, BENIGN ESSENTIAL (ICD-401.1) BP controlled. NO change in medication.  Her updated medication list for this problem includes:    Atenolol 100 Mg Tabs (Atenolol) .Marland Kitchen... Take 1 tablet by mouth once a day    Lisinopril 20 Mg Tabs (Lisinopril) .Marland Kitchen... Take 1 tablet by mouth once a day    Hydrochlorothiazide 25 Mg Tabs (Hydrochlorothiazide) .Marland Kitchen... Take 1 tablet by mouth once a day  Orders: Cardiology Referral (Cardiology)  BP today: 100/81 Prior BP: 119/82 (10/25/2009)  Labs Reviewed: K+: 3.9 (09/14/2009) Creat: : 0.90 (09/14/2009)   Chol: 171 (11/24/2008)   HDL: 30 (11/24/2008)   LDL: 79 (11/24/2008)   TG: 310 (11/24/2008)  Complete Medication List: 1)  Atenolol 100 Mg Tabs (Atenolol) .... Take 1 tablet by mouth once a day 2)  Lisinopril 20 Mg Tabs (Lisinopril) .... Take 1 tablet by mouth once a day 3)  Hydrochlorothiazide 25 Mg Tabs (Hydrochlorothiazide) .... Take 1 tablet by mouth once a day 4)  Advair Diskus 250-50 Mcg/dose Misc (Fluticasone-salmeterol) .... One puff two times a day 5)  Tylenol Extra Strength 500 Mg Tabs (Acetaminophen) .... Take 1 tablet by mouth every 6 hours as needed for pain 6)  Plavix 75 Mg Tabs (Clopidogrel bisulfate) .... Take 1 tablet by mouth once a day 7)  Ra Pen Needles 31g X 8 Mm Misc (Insulin pen needle) .... Use 3 times per day to check the sugar level 8)  Lyrica 50 Mg Caps (Pregabalin) .... Take 1 capsule by mouth three times a day 9)  Amitriptyline Hcl 75 Mg Tabs (Amitriptyline hcl) .... Take 1 tablet by mouth two times a day 10)  Lyrica 100 Mg Caps (Pregabalin) .... Take 1 tablet by mouth three times a day 11)  Tramadol Hcl 50 Mg Tabs (Tramadol hcl) .... One tab up to four times a day for pain 12)  Doxycycline Hyclate 100 Mg Caps (Doxycycline hyclate) .... Take 1 tablet by mouth two times a day 13)  Humalog Mix 75/25 Pen 75-25  % Susp (Insulin lispro prot & lispro) .... Inject under the skin 110 units in the am and 55 units in the evening every day 14)  Diflucan 150 Mg Tabs (Fluconazole) .... Take 1 tablet today and then stop 15)  Ambien 10 Mg Tabs (  Zolpidem tartrate) .... Take 1 tablet once at bedtime as needed for insomnia 16)  Truetrack Test Strp (Glucose blood) .... Use to check blood sugar twice daily 17)  Lancets Misc (Lancets) .... Use to check blood sugar twice daily 18)  Lexapro 10 Mg Tabs (Escitalopram oxalate) .Marland Kitchen.. 1 tablet by mouth every morning 19)  Nitrostat 0.4 Mg Subl (Nitroglycerin) .... Take one tablet under your tongue with severe chest pain, repeat in 5 minutes. attend to doctor/hospital if had to take more then 3 in 20 minutes  Other Orders: Capillary Blood Glucose/CBG (16109)  Patient Instructions: 1)  Tobacco is very bad for your health and your loved ones! You Should stop smoking!. 2)  Stop Smoking Tips: Choose a Quit date. Cut down before the Quit date. decide what you will do as a substitute when you feel the urge to smoke(gum,toothpick,exercise). 3)  If you chest pain persists or become severe, call 911 or come to Emergency Department.  Prescriptions: PLAVIX 75 MG  TABS (CLOPIDOGREL BISULFATE) Take 1 tablet by mouth once a day  #90 x 3   Entered and Authorized by:   Clerance Lav MD   Signed by:   Clerance Lav MD on 11/23/2009   Method used:   Print then Give to Patient   RxID:   [id number] NITROSTAT 0.4 MG SUBL (NITROGLYCERIN) Take one tablet under your tongue with severe chest pain, repeat in 5 minutes. Attend to doctor/hospital if had to take more then 3 in 20 minutes  #30 x 0   Entered and Authorized by:   Clerance Lav MD   Signed by:   Clerance Lav MD on 11/23/2009   Method used:   Print then Give to Patient   RxID:   [id number]    Prevention & Chronic Care Immunizations   Influenza vaccine: Not documented   Influenza vaccine deferral: Refused  (09/14/2009)     Tetanus booster: Not documented   Td booster deferral: Not indicated  (10/25/2009)    Pneumococcal vaccine: Not documented  Colorectal Screening   Hemoccult: Not documented   Hemoccult action/deferral: Not indicated  (10/25/2009)    Colonoscopy: Not documented   Colonoscopy action/deferral: Not indicated  (10/25/2009)  Other Screening   Pap smear: Normal  (08/17/2001)   Pap smear action/deferral: Deferred-3 yr interval  (10/25/2009)    Mammogram: No specific mammographic evidence of malignancy.  Assessment: BIRADS 1.   (04/11/2008)   Mammogram action/deferral: Ordered  (10/25/2009)   Mammogram due: 11/09/2009   Smoking status: current  (11/23/2009)   Smoking cessation counseling: yes  (11/23/2009)  Diabetes Mellitus   HgbA1C: 10.2  (10/25/2009)   HgbA1C action/deferral: Ordered  (09/14/2009)    Eye exam: No diabetic retinopathy.     (05/31/2008)   Eye exam due: 05/2009    Foot exam: yes  (10/25/2009)   High risk foot: Not documented   Foot care education: Not documented    Urine microalbumin/creatinine ratio: 9.4  (09/14/2009)   Urine microalbumin action/deferral: Ordered  Lipids   Total Cholesterol: 171  (11/24/2008)   LDL: 79  (11/24/2008)   LDL Direct: Not documented   HDL: 30  (11/24/2008)   Triglycerides: 310  (11/24/2008)    SGOT (AST): 65  (09/14/2009)   SGPT (ALT): 65  (09/14/2009)   Alkaline phosphatase: 93  (09/14/2009)   Total bilirubin: 0.3  (09/14/2009)  Hypertension   Last Blood Pressure: 100 / 81  (11/23/2009)   Serum creatinine: 0.90  (09/14/2009)   Serum potassium 3.9  (09/14/2009)  Self-Management Support :   Personal Goals (by the next clinic visit) :     Personal A1C goal: 7  (09/14/2009)     Personal blood pressure goal: 130/80  (09/14/2009)     Personal LDL goal: 100  (09/14/2009)    Patient will work on the following items until the next clinic visit to reach self-care goals:     Medications and monitoring: take my medicines  every day, check my blood sugar, bring all of my medications to every visit, examine my feet every day  (11/23/2009)     Eating: drink diet soda or water instead of juice or soda, eat more vegetables, use fresh or frozen vegetables, eat foods that are low in salt, eat baked foods instead of fried foods, eat fruit for snacks and desserts, limit or avoid alcohol  (11/23/2009)    Diabetes self-management support: Written self-care plan  (09/14/2009)   Last diabetes self-management training by diabetes educator: 10/25/2009    Hypertension self-management support: Written self-care plan  (09/14/2009)    Lipid self-management support: Written self-care plan  (09/14/2009)

## 2011-01-21 NOTE — Progress Notes (Signed)
Summary: phone/gg  Phone Note Call from Patient   Caller: Patient Summary of Call: Pt called with c/o vaginal yeast infection  on and off for over a month.  Her CBG's have been running high Initial call taken by: Merrie Roof RN,  October 22, 2009 3:32 PM  Follow-up for Phone Call        will give her one dose of diflucan and will follow up Follow-up by: Mliss Sax MD,  October 22, 2009 3:47 PM  New Problems: CANDIDIASIS, VAGINAL (ICD-112.1)   New Problems: CANDIDIASIS, VAGINAL (ICD-112.1) New/Updated Medications: DIFLUCAN 150 MG TABS (FLUCONAZOLE) Take 1 tablet today and then stop Prescriptions: DIFLUCAN 150 MG TABS (FLUCONAZOLE) Take 1 tablet today and then stop  #1 x 0   Entered and Authorized by:   Mliss Sax MD   Signed by:   Mliss Sax MD on 10/22/2009   Method used:   Faxed to ...       [facility] Department (retail)       [address] [place], [place]  [address]       Ph: [phone]       Fax: [phone]   RxID:   [id number]   Appended Document: phone/gg Rx called in

## 2011-01-21 NOTE — Assessment & Plan Note (Signed)
Summary: FU VISIT/DS   Vital Signs:  Patient profile:   50 year old female Height:      66 inches (167.64 cm) Weight:      246.31 pounds (111.96 kg) BMI:     39.90 Pulse rate:   71 / minute BP sitting:   112 / 75  (right arm)  Vitals Entered By: Starleen Arms CMA (July 13, 2009 10:29 AM) CC: f/u toe  Is Patient Diabetic? Yes  Pain Assessment Patient in pain? no      Nutritional Status BMI of > 30 = obese Nutritional Status Detail nl CBG Result 283  Have you ever been in a relationship where you felt threatened, hurt or afraid?No   Does patient need assistance? Functional Status Self care Ambulation Normal   Primary Care Provider:  Mliss Sax MD  CC:  f/u toe .  History of Present Illness: 50 yo female with uncontrolled diabetes and HTN presents with toe pain. She broke her toe about 1 month ago and she went to wound care center where they put few sutures and she was told to stay off of it for few weeks. Last week they have removed her sutures but to her it appears that the wound is not healing well and now it is more painful, warm to touch, she can't put any pressure on it and has difficulty walking. She denies fever, chills, no LE swelling, no other systemic concerns.There is no pus or blood draining from the wound. She did not try anything OTC to help relieve the pain except occasional ibuprofen but it does not help much. She denies abdominal or urinary concerns, no recent weight loss or gain, no changes in appetite. Reports taking all of her meds regularly. Denies any new traumas to the feet.   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) 2)  Inadequate Material Resources  (ICD-V60.2) 3)  Boils, Recurrent  (ICD-680.9) 4)  Nausea  (ICD-787.02) 5)  Weight Loss  (ICD-783.21) 6)  Dysphagia Unspecified  (ICD-787.20) 7)   Gastroesophageal Reflux Disease  (ICD-530.81) 8)  Diab W/neuro Manifests Type Ii/uns Not Uncntrl  (ICD-250.60) 9)  Ulcer of Other Part of Foot  (ICD-707.15) 10)  Other&unspecified Diseases The Oral Soft Tissues  (ICD-528.9) 11)  Abscess, Skin  (ICD-682.9) 12)  Depression  (ICD-311) 13)  Diabetic Foot Ulcer, Right  (ICD-250.80) 14)  Claudication  (ICD-443.9) 15)  Bell's Palsy  (ICD-351.0) 16)  Back Pain  (ICD-724.5) 17)  Health Screening  (ICD-V70.0) 18)  Dm W/manifestation Nec, Type II, Uncontrolled  (ICD-250.82) 19)  Abscess, Perirectal, Hx of  (ICD-V13.3) 20)  Renal Failure, Acute, Hx of  (ICD-V13.09) 21)  Tobacco Abuse  (ICD-305.1) 22)  COPD  (ICD-496) 23)  Hyperlipidemia Nec/nos  (ICD-272.4) 24)  Transaminases, Serum, Elevated  (ICD-790.4) 25)  Diabetic Peripheral Neuropathy  (ICD-250.60) 26)  Hypertension, Benign Essential  (ICD-401.1)  Medications Prior to Update: 1)  Atenolol 100 Mg Tabs (Atenolol) .... Take 1 Tablet By Mouth Once A Day 2)  Lisinopril 20 Mg Tabs (Lisinopril) .... Take 1 Tablet By Mouth Once A Day 3)  Hydrochlorothiazide 25 Mg Tabs (Hydrochlorothiazide) .... Take 1 Tablet By Mouth Once A Day 4)  Advair Diskus 250-50 Mcg/dose Misc (Fluticasone-Salmeterol) .... One Puff Two Times A Day 5)  Novolog Mix 70/30 70-30 % Susp (Insulin Asp Prot & Asp (Hum)) .Marland KitchenMarland KitchenMarland Kitchen  120 Units Subcutaneously Every Am 6)  Novolog Mix 70/30 70-30 % Susp (Insulin Asp Prot & Asp (Hum)) .... 65 Units Subcutaneously Every Pm 7)  Tylenol Extra Strength 500 Mg Tabs (Acetaminophen) .... Take 1 Tablet By Mouth Every 6 Hours As Needed For Pain 8)  Pravachol 20 Mg  Tabs (Pravastatin Sodium) .... Take 1 Tab By Mouth At Bedtime 9)  Plavix 75 Mg  Tabs (Clopidogrel Bisulfate) .... Take 1 Tablet By Mouth Once A Day 10)  Paxil 20 Mg Tabs (Paroxetine Hcl) .... Take 1 Tablet By Mouth Once A Day 11)  Ra Pen Needles 31g X 8 Mm Misc (Insulin Pen Needle) .... Use 3 Times Per Day To Check The Sugar Level 12)  Lyrica  50 Mg Caps (Pregabalin) .... Take 1 Capsule By Mouth Three Times A Day 13)  Cleocin 150 Mg Caps (Clindamycin Hcl) .... Take Two Tablets Three Times A Day 14)  Cipro 500 Mg Tabs (Ciprofloxacin Hcl) .... Take 1 Tablet By Mouth Two Times A Day 15)  Amitriptyline Hcl 75 Mg Tabs (Amitriptyline Hcl) .... Take 1 Tablet By Mouth Two Times A Day  Current Medications (verified): 1)  Atenolol 100 Mg Tabs (Atenolol) .... Take 1 Tablet By Mouth Once A Day 2)  Lisinopril 20 Mg Tabs (Lisinopril) .... Take 1 Tablet By Mouth Once A Day 3)  Hydrochlorothiazide 25 Mg Tabs (Hydrochlorothiazide) .... Take 1 Tablet By Mouth Once A Day 4)  Advair Diskus 250-50 Mcg/dose Misc (Fluticasone-Salmeterol) .... One Puff Two Times A Day 5)  Novolog Mix 70/30 70-30 % Susp (Insulin Asp Prot & Asp (Hum)) .Marland Kitchen.. 120 Units Subcutaneously Every Am 6)  Novolog Mix 70/30 70-30 % Susp (Insulin Asp Prot & Asp (Hum)) .... 65 Units Subcutaneously Every Pm 7)  Tylenol Extra Strength 500 Mg Tabs (Acetaminophen) .... Take 1 Tablet By Mouth Every 6 Hours As Needed For Pain 8)  Pravachol 20 Mg  Tabs (Pravastatin Sodium) .... Take 1 Tab By Mouth At Bedtime 9)  Plavix 75 Mg  Tabs (Clopidogrel Bisulfate) .... Take 1 Tablet By Mouth Once A Day 10)  Paxil 20 Mg Tabs (Paroxetine Hcl) .... Take 1 Tablet By Mouth Once A Day 11)  Ra Pen Needles 31g X 8 Mm Misc (Insulin Pen Needle) .... Use 3 Times Per Day To Check The Sugar Level 12)  Lyrica 50 Mg Caps (Pregabalin) .... Take 1 Capsule By Mouth Three Times A Day 13)  Cleocin 150 Mg Caps (Clindamycin Hcl) .... Take Two Tablets Three Times A Day 14)  Cipro 500 Mg Tabs (Ciprofloxacin Hcl) .... Take 1 Tablet By Mouth Two Times A Day 15)  Amitriptyline Hcl 75 Mg Tabs (Amitriptyline Hcl) .... Take 1 Tablet By Mouth Two Times A Day  Allergies: 1)  ! Morphine Sulfate (Morphine Sulfate) 2)  ! * Iodinated Contrast  Past History:  Past Medical History: Last updated: 11/24/2008 Current Problems:  RENAL  FAILURE, ACUTE, HX OF (ICD-V13.09) TOBACCO ABUSE (ICD-305.1)     COPD (ICD-496) HYPERLIPIDEMIA NEC/NOS (ICD-272.4)     TRANSAMINASES, SERUM, ELEVATED (ICD-790.4) Diabetes mellitus type II     DIABETIC PERIPHERAL NEUROPATHY (ICD-250.60)     DIABETIC FOOT ULCER, TOE (ICD-250.80) HYPERTENSION, BENIGN ESSENTIAL (ICD-401.1)  Past Surgical History: Last updated: 01/08/2007 PTCA/stent, left iliac 2005 R shoulder arthroscropy/debridement Hysterectomy, 1997 due to fibroids  Family History: Last updated: 06/22/2008 Mother  DM2, CHF, living Father asthma, living Brothers and sisters asthma since childhood  Social History: Last updated: 03/28/2009 Lives with daughter-in-law in apartment, currently  unemployed  Risk Factors: Alcohol Use: 0 (07/13/2009) Exercise: no (05/28/2009)  Risk Factors: Smoking Status: never (07/13/2009) Packs/Day: 0.5 (07/02/2009) Passive Smoke Exposure: yes (07/02/2009)  Family History: Reviewed history from 06/22/2008 and no changes required. Mother  DM2, CHF, living Father asthma, living Brothers and sisters asthma since childhood  Social History: Reviewed history from 03/28/2009 and no changes required. Lives with daughter-in-law in apartment, currently unemployedSmoking Status:  never  Review of Systems  The patient denies fever, weight loss, weight gain, chest pain, syncope, dyspnea on exertion, peripheral edema, hemoptysis, abdominal pain, melena, muscle weakness, suspicious skin lesions, and depression.    Physical Exam  General:  Well-developed,well-nourished,in no acute distress; alert,appropriate and cooperative throughout examination Lungs:  Normal respiratory effort, chest expands symmetrically. Lungs are clear to auscultation, no crackles or wheezes. Heart:  Normal rate and regular rhythm. S1 and S2 normal without gallop, murmur, click, rub or other extra sounds. Pulses:  R and L carotid,radial,femoral,dorsalis pedis and posterior tibial  pulses are full and equal bilaterally Extremities:  No clubbing, cyanosis, edema, or deformity noted with normal full range of motion of all joints.    Diabetes Management Exam:    Foot Exam (with socks and/or shoes not present):       Sensory-Pinprick/Light touch:          Left medial foot (L-4): diminished          Left dorsal foot (L-5): diminished          Left lateral foot (S-1): diminished          Right medial foot (L-4): diminished          Right dorsal foot (L-5): diminished          Right lateral foot (S-1): diminished       Sensory-Monofilament:          Left foot: diminished          Right foot: diminished       Inspection:          Left foot: normal          Right foot: normal       Nails:          Left foot: thickened          Right foot: thickened    Foot/Ankle Exam  Foot Exam:    Right:    Inspection:  Normal    Palpation:  Normal    Stability:  stable    Tenderness:  no    Swelling:  no    Erythema:  no    Left:    Inspection:  Normal    Palpation:  Normal    Stability:  stable    Tenderness:  yes    Swelling:  no    Erythema:  no    cut thrrough the skin on the medial aspect of the left little toe, fat tissue seen but not bone, not healing well, tender to palpation,with mild area of swelling and erythema around it but not involving the rest of the foot   Impression & Recommendations:  Problem # 1:  FRACTURE, TOE, LEFT (ICD-826.0) Wound not healing well and I suspect it is related to her uncontrolled diabetes. I have called wound care center to re-evaluate the wound since they were the last ones that took out the stiches and I think it might need to be placed again for another week. They have agreed to see her this AM so pt will  go there right after this appointment. I will follow up on recommendations since this pt has uncontrolled diabetes and we have to ensure that it heals so that she does not risk loosing her toe.   Problem # 2:  DEPRESSION  (ICD-311) Controlled well on lyrica. Pt has not been taking paxil in few months since Lyrica seems to be working.  The following medications were removed from the medication list:    Paxil 20 Mg Tabs (Paroxetine hcl) .Marland Kitchen... Take 1 tablet by mouth once a day  Problem # 3:  HYPERLIPIDEMIA NEC/NOS (ICD-272.4) Will check FLP on her next visit when we check the rest of the labs since pt reports not fasting and having a meal right before coming here.  Her updated medication list for this problem includes:    Pravachol 20 Mg Tabs (Pravastatin sodium) .Marland Kitchen... Take 1 tab by mouth at bedtime Labs Reviewed: SGOT: 21 (01/29/2009)   SGPT: 45 (01/29/2009)   HDL:30 (11/24/2008), 31 (06/22/2008)  LDL:79 (11/24/2008), 122 (16/09/9603)  Chol:171 (11/24/2008), 183 (06/22/2008)  Trig:310 (11/24/2008), 150 (06/22/2008)  Problem # 4:  HYPERTENSION, BENIGN ESSENTIAL (ICD-401.1) Good control, will continue the same regimen for now. Will check electrolytes on her next visit.  Her updated medication list for this problem includes:    Atenolol 100 Mg Tabs (Atenolol) .Marland Kitchen... Take 1 tablet by mouth once a day    Lisinopril 20 Mg Tabs (Lisinopril) .Marland Kitchen... Take 1 tablet by mouth once a day    Hydrochlorothiazide 25 Mg Tabs (Hydrochlorothiazide) .Marland Kitchen... Take 1 tablet by mouth once a day  BP today: 112/75 Prior BP: 111/74 (07/06/2009)  Labs Reviewed: K+: 4.3 (01/29/2009) Creat: : 0.84 (01/29/2009)   Chol: 171 (11/24/2008)   HDL: 30 (11/24/2008)   LDL: 79 (11/24/2008)   TG: 310 (11/24/2008)  Problem # 5:  DM W/MANIFESTATION NEC, TYPE II, UNCONTROLLED (ICD-250.82) Pt has been following the advice and now we see trnding down in A1C, excellent for her. We discussed this and she was very pleased with the result. She is due for eye examand will schedule this on her next appointment.  Her updated medication list for this problem includes:    Novolog Mix 70/30 70-30 % Susp (Insulin asp prot & asp (hum)) .Marland Kitchen... 120 units subcutaneously  every am    Novolog Mix 70/30 70-30 % Susp (Insulin asp prot & asp (hum)) .Marland KitchenMarland KitchenMarland KitchenMarland Kitchen 65 units subcutaneously every pm  Labs Reviewed: Creat: 0.84 (01/29/2009)     Last Eye Exam: No diabetic retinopathy.    (05/31/2008) Reviewed HgBA1c results: 9.9 (07/13/2009)  11.8 (05/28/2009)  Complete Medication List: 1)  Atenolol 100 Mg Tabs (Atenolol) .... Take 1 tablet by mouth once a day 2)  Lisinopril 20 Mg Tabs (Lisinopril) .... Take 1 tablet by mouth once a day 3)  Hydrochlorothiazide 25 Mg Tabs (Hydrochlorothiazide) .... Take 1 tablet by mouth once a day 4)  Advair Diskus 250-50 Mcg/dose Misc (Fluticasone-salmeterol) .... One puff two times a day 5)  Novolog Mix 70/30 70-30 % Susp (Insulin asp prot & asp (hum)) .Marland Kitchen.. 120 units subcutaneously every am 6)  Novolog Mix 70/30 70-30 % Susp (Insulin asp prot & asp (hum)) .... 65 units subcutaneously every pm 7)  Tylenol Extra Strength 500 Mg Tabs (Acetaminophen) .... Take 1 tablet by mouth every 6 hours as needed for pain 8)  Pravachol 20 Mg Tabs (Pravastatin sodium) .... Take 1 tab by mouth at bedtime 9)  Plavix 75 Mg Tabs (Clopidogrel bisulfate) .... Take 1 tablet by mouth once  a day 10)  Ra Pen Needles 31g X 8 Mm Misc (Insulin pen needle) .... Use 3 times per day to check the sugar level 11)  Lyrica 50 Mg Caps (Pregabalin) .... Take 1 capsule by mouth three times a day 12)  Cleocin 150 Mg Caps (Clindamycin hcl) .... Take two tablets three times a day 13)  Cipro 500 Mg Tabs (Ciprofloxacin hcl) .... Take 1 tablet by mouth two times a day 14)  Amitriptyline Hcl 75 Mg Tabs (Amitriptyline hcl) .... Take 1 tablet by mouth two times a day  Other Orders: T- Capillary Blood Glucose (16109) T-Hgb A1C (in-house) ([id number]) T-CBC No Diff ([id number])  Patient Instructions: 1)  Please schedule a follow-up appointment in 2 months. 2)  Check your blood sugars regularly. If your readings are usually above 300 : or below 70 you should contact our office. 3)  It  is important that your Diabetic A1c level is checked every 3 months. 4)  See your eye doctor yearly to check for diabetic eye damage. 5)  Check your feet each night for sore areas, calluses or signs of infection. 6)  Check your Blood Pressure regularly. If it is above 170: you should make an appointment. 7)  CMP prior to visit, ICD-9: 8)  Lipid Panel prior to visit, ICD-9: 9)  Schedule eye exam appointment since due at this time.   Laboratory Results   Blood Tests   Date/Time Received: July 13, 2009 11:48 AM  Date/Time Reported: Oren Beckmann  July 13, 2009 11:48 AM   HGBA1C: 9.9%   (Normal Range: Non-Diabetic - 3-6%   Control Diabetic - 6-8%) CBG Random:: 283mg /dL

## 2011-01-21 NOTE — Letter (Signed)
Summary: [facility]  [facility]   Imported By: [name] 05/06/2010 09:05:15  _____________________________________________________________________  External Attachment:    Type:   Image     Comment:   External Document

## 2011-01-21 NOTE — Progress Notes (Signed)
Summary: diarrhea/dmr  Phone Note Outgoing Call   Call placed by: [name] RD,CDE,  February 08, 2010 2:06 PM Summary of Call: returned message that patient left on voicemail. she said she was up unitl 2 am wiht diarrhea after tkaing first dose of metformin last nght wiht dinner. Told her ot cut it in half and take 1/2 tablet (500mg ) with breakfast and dinner for one week and if she doiesn;t tolerate that to only take it once a day for next week. Advised her to increase 500 mg each week until up to full dose of 1000 mg twice daily. She will call if sh ehas further problems.  should medication record be changed to reflect this?

## 2011-01-21 NOTE — Assessment & Plan Note (Signed)
Summary: ACUTE-SHOULDER PAIN (MAGICK)/cfb   Vital Signs:  Patient profile:   50 year old female Height:      66 inches (167.64 cm) Weight:      247.6 pounds (113.77 kg) BMI:     40.55 Temp:     97.5 degrees F (36.39 degrees C) oral Pulse rate:   64 / minute BP sitting:   151 / 91  (right arm) Cuff size:   large  Vitals Entered By: Theotis Barrio NT II (June 28, 2010 2:46 PM) CC: COMPLAIN OF RIGHT SHOULDER AND NECK PAIN /  MEDICATION REFILL Is Patient Diabetic? Yes Did you bring your meter with you today? No Pain Assessment Patient in pain? yes     Location: L/SHOULDER =- NECK Intensity:    9 Type: sharp Onset of pain  CHRONIC - PAIN IS GETTING WORST Nutritional Status BMI of > 30 = obese  Have you ever been in a relationship where you felt threatened, hurt or afraid?No   Does patient need assistance? Functional Status Self care Ambulation Normal Comments LEFTSHOULDER AND NECK PAIN   Primary Care Provider:  Mliss Sax MD  CC:  COMPLAIN OF RIGHT SHOULDER AND NECK PAIN /  MEDICATION REFILL.  History of Present Illness: Patient is a 50 year old women with PMH as described in EMR most notable for DM, HTN, HLD and new onset left shoulder pain.  Patient saw Dr Aldine Contes 3 weeks ago and was given prescription of percocet along with some Xrays were taken at that visit. Patient reports that she never refilled the prescriptions and the pain is unchanged and may have become a little worse. Pain is described as 6-7/10, sharp pain, radiating to her neck, aggravated by movement and sleeping over left side. No relieving factors. She has had rotator cuff repair in her right arm in 2005.  Her BP is elevated today in the setting of acute pain and discomfort but she has been compliant with her meds.  Her DM is uncontrolled and very difficult to manage in the past. She injects 110 units in morning and 55 units at night and her meter readings usually run in 200's. She didnt bring her meter  today.  Still smoking 1ppd and not interested in quitting at this time.    Preventive Screening-Counseling & Management  Alcohol-Tobacco     Alcohol drinks/day: 0     Smoking Status: current     Smoking Cessation Counseling: yes     Packs/Day: 1 ppd     Year Started: years     Passive Smoke Exposure: yes  Caffeine-Diet-Exercise     Does Patient Exercise: no  Problems Prior to Update: 1)  Shoulder Pain, Left  (ICD-719.41) 2)  Hyperlipidemia Nec/nos  (ICD-272.4) 3)  Hypertension, Benign Essential  (ICD-401.1) 4)  Hypertension  (ICD-401.9) 5)  Chest Pain  (ICD-786.50) 6)  Insomnia  (ICD-780.52) 7)  Candidiasis, Vaginal  (ICD-112.1) 8)  Hip Pain, Right  (ICD-719.45) 9)  Boils, Recurrent  (ICD-680.9) 10)  Diab W/neuro Manifests Type Ii/uns Not Uncntrl  (ICD-250.60) 11)  Depression  (ICD-311) 12)  Diabetic Foot Ulcer, Right  (ICD-250.80) 13)  Claudication  (ICD-443.9) 14)  Dm W/manifestation Nec, Type II, Uncontrolled  (ICD-250.82) 15)  Tobacco Abuse  (ICD-305.1) 16)  COPD  (ICD-496) 17)  Transaminases, Serum, Elevated  (ICD-790.4) 18)  Diabetic Peripheral Neuropathy  (ICD-250.60) 19)  Knee Pain  (ICD-719.46) 20)  Fracture, Toe, Left  (ICD-826.0) 21)  Diabetic Foot Ulcer, Left  (ICD-250.80) 22)  Inadequate Material Resources  (ICD-V60.2) 23)  Nausea  (ICD-787.02) 24)  Weight Loss  (ICD-783.21) 25)  Dysphagia Unspecified  (ICD-787.20) 26)  Gastroesophageal Reflux Disease  (ICD-530.81) 27)  Ulcer of Other Part of Foot  (ICD-707.15) 28)  Other&unspecified Diseases The Oral Soft Tissues  (ICD-528.9) 29)  Abscess, Skin  (ICD-682.9) 30)  Bell's Palsy  (ICD-351.0) 31)  Back Pain  (ICD-724.5) 32)  Health Screening  (ICD-V70.0) 33)  Abscess, Perirectal, Hx of  (ICD-V13.3) 34)  Renal Failure, Acute, Hx of  (ICD-V13.09) 35)  Glaucoma  (ICD-365.9) 36)  Disc Disease, Lumbar  (ICD-722.52)  Medications Prior to Update: 1)  Atenolol 100 Mg Tabs (Atenolol) .... Take 1 Tablet By  Mouth Once A Day 2)  Lisinopril 20 Mg Tabs (Lisinopril) .... Take 1 Tablet By Mouth Once A Day 3)  Hydrochlorothiazide 25 Mg Tabs (Hydrochlorothiazide) .... Take 1 Tablet By Mouth Once A Day 4)  Advair Diskus 250-50 Mcg/dose Misc (Fluticasone-Salmeterol) .... One Puff Two Times A Day 5)  Tylenol Extra Strength 500 Mg Tabs (Acetaminophen) .... Take 1 Tablet By Mouth Every 6 Hours As Needed For Pain 6)  Plavix 75 Mg  Tabs (Clopidogrel Bisulfate) .... Take 1 Tablet By Mouth Once A Day 7)  Ra Pen Needles 31g X 8 Mm Misc (Insulin Pen Needle) .... Use 3 Times Per Day To Check The Sugar Level 8)  Amitriptyline Hcl 75 Mg Tabs (Amitriptyline Hcl) .... Take 1 Tablet By Mouth Two Times A Day 9)  Lyrica 100 Mg Caps (Pregabalin) .... Take 1 Tablet By Mouth Three Times A Day 10)  Tramadol Hcl 50 Mg Tabs (Tramadol Hcl) .... One Tab Up To Four Times A Day For Pain 11)  Humalog Mix 75/25 Pen 75-25 % Susp (Insulin Lispro Prot & Lispro) .... Inject Under The Skin 110 Units in The Am and 55 Units in The Evening Every Day 12)  Truetrack Test  Strp (Glucose Blood) .... Use To Check Blood Sugar Twice Daily 13)  Lancets  Misc (Lancets) .... Use To Check Blood Sugar Twice Daily 14)  Nitrostat 0.4 Mg Subl (Nitroglycerin) .... Take One Tablet Under Your Tongue With Severe Chest Pain, Repeat in 5 Minutes. Attend To Doctor/hospital If Had To Take More Then 3 in 20 Minutes 15)  Lidoderm 5 % Ptch (Lidocaine) .... Apply To The Affected Area, 12 Hours On and 12 Hours Off 16)  Trazodone Hcl 50 Mg Tabs (Trazodone Hcl) .... Take 1 Tab By Mouth At Bedtime As Needed To Fall Sleep. 17)  Metformin Hcl 1000 Mg Tabs (Metformin Hcl) .... Take 1/2 Tablet By Mouth Two Times A Day 18)  Diflucan 100 Mg Tabs (Fluconazole) .... Take 1 Tablet By Mouth Two Times A Day 19)  Percocet 5-325 Mg Tabs (Oxycodone-Acetaminophen) .... Take 1 Tablet Twice Daily As Needed For Pain  Current Medications (verified): 1)  Atenolol 100 Mg Tabs (Atenolol) ....  Take 1 Tablet By Mouth Once A Day 2)  Lisinopril 20 Mg Tabs (Lisinopril) .... Take 1 Tablet By Mouth Once A Day 3)  Hydrochlorothiazide 25 Mg Tabs (Hydrochlorothiazide) .... Take 1 Tablet By Mouth Once A Day 4)  Advair Diskus 250-50 Mcg/dose Misc (Fluticasone-Salmeterol) .... One Puff Two Times A Day 5)  Plavix 75 Mg  Tabs (Clopidogrel Bisulfate) .... Take 1 Tablet By Mouth Once A Day 6)  Ra Pen Needles 31g X 8 Mm Misc (Insulin Pen Needle) .... Use 3 Times Per Day To Check The Sugar Level 7)  Lyrica 100 Mg Caps (Pregabalin) .Marland KitchenMarland KitchenMarland Kitchen  Take 1 Tablet By Mouth Three Times A Day 8)  Tramadol Hcl 50 Mg Tabs (Tramadol Hcl) .... One Tab Up To Four Times A Day For Pain 9)  Humalog Mix 75/25 Pen 75-25 % Susp (Insulin Lispro Prot & Lispro) .... Inject Under The Skin 110 Units in The Am and 55 Units in The Evening Every Day 10)  Truetrack Test  Strp (Glucose Blood) .... Use To Check Blood Sugar Twice Daily 11)  Lancets  Misc (Lancets) .... Use To Check Blood Sugar Twice Daily 12)  Nitrostat 0.4 Mg Subl (Nitroglycerin) .... Take One Tablet Under Your Tongue With Severe Chest Pain, Repeat in 5 Minutes. Attend To Doctor/hospital If Had To Take More Then 3 in 20 Minutes 13)  Diflucan 100 Mg Tabs (Fluconazole) .... Take 1 Tablet By Mouth Two Times A Day 14)  Lexapro 10 Mg Tabs (Escitalopram Oxalate) .... Take 1 Tablet By Mouth Once A Day 15)  Hydrocodone-Acetaminophen 5-325 Mg Tabs (Hydrocodone-Acetaminophen) .... Take 1 Tablet By Mouth Two Times A Day As Needed For Pain.  Allergies (verified): 1)  ! Morphine Sulfate (Morphine Sulfate) 2)  ! * Iodinated Contrast  Past History:  Past Medical History: Last updated: 11/30/2009 Current Problems:  HYPERLIPIDEMIA NEC/NOS (ICD-272.4) HYPERTENSION, BENIGN ESSENTIAL (ICD-401.1) CANDIDIASIS, VAGINAL (ICD-112.1) BOILS, RECURRENT (ICD-680.9) DIAB W/NEURO MANIFESTS TYPE II/UNS NOT UNCNTRL (ICD-250.60) DEPRESSION (ICD-311) DIABETIC FOOT ULCER, RIGHT (ICD-250.80) PVD DM  W/MANIFESTATION NEC, TYPE II, UNCONTROLLED (ICD-250.82) COPD (ICD-496) TRANSAMINASES, SERUM, ELEVATED (ICD-790.4) DIABETIC PERIPHERAL NEUROPATHY (ICD-250.60) GASTROESOPHAGEAL REFLUX DISEASE (ICD-530.81) BELL'S PALSY (ICD-351.0) ABSCESS, PERIRECTAL, HX OF (ICD-V13.3) RENAL FAILURE, ACUTE, HX OF (ICD-V13.09) with contrast H/O elevated LFTs with statins GLAUCOMA (ICD-365.9) DISC DISEASE, 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   Social History: Last updated: 11/30/2009 Lives with daughter-in-law in apartment, currently unemployed Divorced  Tobacco Use - Yes.  Alcohol Use - no  Risk Factors: Alcohol Use: 0 (06/28/2010) Exercise: no (06/28/2010)  Risk Factors: Smoking Status: current (06/28/2010) Packs/Day: 1 ppd (06/28/2010) Passive Smoke Exposure: yes (06/28/2010)  Family History: Mother  DM2, CHF, living Father asthma, living Brothers and sisters asthma since childhood Younger sister had heart attack at age of 29, since then she is doing well.   Review of Systems      See HPI  Physical Exam  Additional Exam:  Gen: AOx3, in no acute distress, morbidly obese Eyes: PERRL, EOMI ENT:MMM, No erythema noted in posterior pharynx Neck: No JVD, No LAP Chest: CTAB with  good respiratory effort CVS: regular rhythmic rate, NO M/R/G, S1 S2 normal Abdo: soft,ND, BS+x4, Non tender and No hepatosplenomegaly EXT: No odema noted Neuro: Non focal, gait is normal Skin: no rashes noted.    Impression & Recommendations:  Problem # 1:  SHOULDER PAIN, LEFT (ICD-719.41) Assessment Deteriorated I  discussed the case with Dr Aundria Rud and prescribed her vicodin untill she sees Sports medicine as we cannot prescribe her NSAIDS(she is also on Lisinopril). The xray is suggestive of some arthritic changes, gioven her history of ritator cuff repair on right shoulder, the diff includes rotator tear on the left shoulder. I will refer her to Sports medicine and follow up in 2 weeks. I will also get  a UDS done today as we are prescribing narcotics. The following medications were removed from the medication list:    Tylenol Extra Strength 500 Mg Tabs (Acetaminophen) .Marland Kitchen... Take 1 tablet by mouth every 6 hours as needed for pain    Percocet 5-325 Mg Tabs (Oxycodone-acetaminophen) .Marland Kitchen... Take 1 tablet twice daily as needed for pain Her updated medication list for this problem includes:    Tramadol Hcl 50 Mg Tabs (Tramadol hcl) ..... One tab up to four times a day for pain    Hydrocodone-acetaminophen 5-325 Mg Tabs (Hydrocodone-acetaminophen) .Marland Kitchen... Take 1 tablet by mouth two times a day as needed for pain.  Orders: Sports Medicine (Sports Med) T-Drug Screen-Urine, (single) [phone])  Discussed shoulder exercises, use of moist heat or ice, and medication.   Problem # 2:  HYPERTENSION, BENIGN ESSENTIAL (ICD-401.1) Assessment: Deteriorated No chnages made today. Her updated medication list for this problem includes:    Atenolol 100 Mg Tabs (Atenolol) .Marland Kitchen... Take 1 tablet by mouth once a day    Lisinopril 20 Mg Tabs (Lisinopril) .Marland Kitchen... Take 1 tablet by mouth once a day    Hydrochlorothiazide 25 Mg Tabs (Hydrochlorothiazide) .Marland Kitchen... Take 1 tablet by mouth once a day  BP today: 151/91 Prior BP: 126/73 (06/03/2010)  Labs Reviewed: K+: 3.9 (02/07/2010) Creat: : 0.67 (02/07/2010)   Chol: 173 (02/07/2010)   HDL: 35 (02/07/2010)   LDL: 109 (02/07/2010)   TG: 145 (02/07/2010)  Problem # 3:  DIAB W/NEURO MANIFESTS TYPE II/UNS NOT UNCNTRL (ICD-250.60) Assessment: Unchanged Patinet is unable to tolerate  metformina as it make her stomach sick. I encouraged her improve her dietery habits. I will not change her insulin at this time as she did not bring her meter and I do not know if she is having nay lows at all. Given ophtha referral. Already on Lisinopril. The following medications were removed from the medication list:    Metformin Hcl 1000 Mg Tabs (Metformin hcl) .Marland Kitchen... Take 1/2 tablet by mouth two times a day Her updated medication list for this problem includes:    Lisinopril 20 Mg Tabs (Lisinopril) .Marland Kitchen... Take 1 tablet by mouth once a day    Humalog Mix 75/25 Pen 75-25 % Susp (Insulin lispro prot & lispro) ..... Inject under the skin 110 units in the am and 55 units in the evening every day  Labs Reviewed: Creat: 0.67 (02/07/2010)     Last Eye Exam: No diabetic retinopathy.    (05/31/2008) Reviewed HgBA1c results: 12.5 (05/08/2010)  9.8 (02/07/2010)  Problem # 4:  Preventive Health Care (ICD-V70.0) Assessment: Comment Only Mammopgram today.  Complete Medication List: 1)  Atenolol 100 Mg Tabs (Atenolol) .... Take 1 tablet by mouth once a day 2)  Lisinopril 20 Mg Tabs (Lisinopril) .... Take 1 tablet by mouth once a day 3)  Hydrochlorothiazide 25 Mg Tabs (Hydrochlorothiazide) .... Take 1 tablet by mouth once a day 4)  Advair Diskus 250-50 Mcg/dose Misc (Fluticasone-salmeterol) .... One puff two times a day 5)  Plavix 75 Mg Tabs (Clopidogrel bisulfate) .... Take 1 tablet by mouth once a day 6)  Ra Pen Needles 31g X 8 Mm Misc (Insulin pen needle) .... Use 3 times per day to check the sugar level 7)  Lyrica 100 Mg Caps (Pregabalin) .... Take 1 tablet by mouth three times a day 8)  Tramadol Hcl 50 Mg Tabs (Tramadol hcl) .... One tab up to four times a day for pain 9)  Humalog Mix 75/25 Pen 75-25 % Susp (Insulin lispro  prot & lispro) .... Inject under the skin 110 units in the am and 55 units in the evening every day 10)  Truetrack Test Strp (Glucose blood) .... Use to check blood sugar  twice daily 11)  Lancets Misc (Lancets) .... Use to check blood sugar twice daily 12)  Nitrostat 0.4 Mg Subl (Nitroglycerin) .... Take one tablet under your tongue with severe chest pain, repeat in 5 minutes. attend to doctor/hospital if had to take more then 3 in 20 minutes 13)  Diflucan 100 Mg Tabs (Fluconazole) .... Take 1 tablet by mouth two times a day 14)  Lexapro 10 Mg Tabs (Escitalopram oxalate) .... Take 1 tablet by mouth once a day 15)  Hydrocodone-acetaminophen 5-325 Mg Tabs (Hydrocodone-acetaminophen) .... Take 1 tablet by mouth two times a day as needed for pain.  Other Orders: Mammogram (Screening) (Mammo) Ophthalmology Referral (Ophthalmology)  Patient Instructions: 1)  It is important that you exercise regularly at least 20 minutes 5 times a week. If you develop chest pain, have severe difficulty breathing, or feel very tired , stop exercising immediately and seek medical attention. 2)  You need to lose weight. Consider a lower calorie diet and regular exercise.  3)  Take an Aspirin every day. 4)  Check your blood sugars regularly. If your readings are usually above : 200 or below 70 you should contact our office. 5)  It is important that your Diabetic A1c level is checked every 3 months. 6)  See your eye doctor yearly to check for diabetic eye damage. 7)  Check your feet each night for sore areas, calluses or signs of infection. 8)  Check your Blood Pressure regularly. If it is above:130/80  you should make an appointment. 9)  Please schedule a follow-up appointment in 1 month. 10)  Please schedule a follow-up appointment as needed. Prescriptions: HYDROCODONE-ACETAMINOPHEN 5-325 MG TABS (HYDROCODONE-ACETAMINOPHEN) Take 1 tablet by mouth two times a day as needed for pain.  #30 x 0   Entered and Authorized by:   Lars Mage MD   Signed by:   Lars Mage MD on 06/28/2010   Method used:   Print then Give to Patient   RxID:   [id number]  Process Orders Check Orders  Results:     [facility]: ABN not required for this insurance Tests Sent for requisitioning (June 29, 2010 8:28 PM):     06/28/2010: [facility] -- T-Drug Screen-Urine, (single) [phone] (signed)    Prevention & Chronic Care Immunizations   Influenza vaccine: Not documented   Influenza vaccine deferral: Not indicated  (04/01/2010)    Tetanus booster: Not documented   Td booster deferral: Not indicated  (10/25/2009)    Pneumococcal vaccine: Not documented  Colorectal Screening   Hemoccult: Not documented   Hemoccult action/deferral: Not indicated  (10/25/2009)    Colonoscopy: Not documented   Colonoscopy action/deferral: Not indicated  (10/25/2009)  Other Screening   Pap smear: Normal  (08/17/2001)   Pap smear action/deferral: Not indicated-other  (04/01/2010)    Mammogram: No specific mammographic evidence of malignancy.  Assessment: BIRADS 1.   (04/11/2008)   Mammogram action/deferral: Ordered  (06/28/2010)   Mammogram due: 11/09/2009   Smoking status: current  (06/28/2010)   Smoking cessation counseling: yes  (06/28/2010)  Diabetes Mellitus   HgbA1C: 12.5  (05/08/2010)   HgbA1C action/deferral: Ordered  (09/14/2009)    Eye exam: No diabetic retinopathy.     (05/31/2008)   Diabetic eye exam action/deferral: Ophthalmology referral  (06/28/2010)  Eye exam due: 05/2009    Foot exam: yes  (10/25/2009)   High risk foot: Not documented   Foot care education: Not documented    Urine microalbumin/creatinine ratio: 9.4  (09/14/2009)   Urine microalbumin action/deferral: Ordered    Diabetes flowsheet reviewed?: Yes   Progress toward A1C goal: Deteriorated  Lipids   Total Cholesterol: 173  (02/07/2010)   LDL: 109  (02/07/2010)   LDL Direct: Not documented   HDL: 35  (02/07/2010)   Triglycerides: 145  (02/07/2010)    SGOT (AST): 37  (02/07/2010)   SGPT (ALT): 39  (02/07/2010)   Alkaline phosphatase: 85  (02/07/2010)   Total  bilirubin: 0.3  (02/07/2010)    Lipid flowsheet reviewed?: Yes   Progress toward LDL goal: Unchanged  Hypertension   Last Blood Pressure: 151 / 91  (06/28/2010)   Serum creatinine: 0.67  (02/07/2010)   Serum potassium 3.9  (02/07/2010)    Hypertension flowsheet reviewed?: Yes   Progress toward BP goal: Deteriorated  Self-Management Support :   Personal Goals (by the next clinic visit) :     Personal A1C goal: 7  (09/14/2009)     Personal blood pressure goal: 130/80  (09/14/2009)     Personal LDL goal: 100  (09/14/2009)    Patient will work on the following items until the next clinic visit to reach self-care goals:     Medications and monitoring: take my medicines every day, check my blood sugar, bring all of my medications to every visit, examine my feet every day  (06/28/2010)     Eating: drink diet soda or water instead of juice or soda, eat more vegetables, use fresh or frozen vegetables, eat foods that are low in salt, eat baked foods instead of fried foods, eat fruit for snacks and desserts, limit or avoid alcohol  (06/28/2010)     Activity: take a 30 minute walk every day  (06/03/2010)    Diabetes self-management support: Resources for patients handout, Written self-care plan  (06/28/2010)   Diabetes care plan printed   Last diabetes self-management training by diabetes educator: 06/03/2010    Hypertension self-management support: Resources for patients handout, Written self-care plan  (06/28/2010)   Hypertension self-care plan printed.    Lipid self-management support: Resources for patients handout, Written self-care plan  (06/28/2010)   Lipid self-care plan printed.      Resource handout printed.   Nursing Instructions: Schedule screening mammogram (see order) Refer for screening diabetic eye exam (see order)    Process Orders Check Orders Results:     [facility]: ABN not required for this insurance Tests Sent for requisitioning (June 29, 2010  8:28 PM):     06/28/2010: [facility] -- T-Drug Screen-Urine, (single) [[id number]] (signed)

## 2011-01-21 NOTE — Progress Notes (Signed)
Summary: Prescription  Phone Note Outgoing Call   Call placed by: Angelina Ok RN,  May 10, 2010 4:01 PM Call placed to: Insurer Summary of Call: Call to [facility]. They need a handwritten prescription for the Lyrica along with a DEA number .  Prescription has tobe written by an attending.  Pt's Home address should also be on the prescription.  Fax to [phone] with a cover sheet.  Initial call taken by: Angelina Ok RN,  May 10, 2010 4:03 PM  Follow-up for Phone Call        rx written signed and faxed to [facility] Follow-up by: Acey Lav MD,  May 10, 2010 4:06 PM    Prescriptions: LYRICA 100 MG CAPS (PREGABALIN) Take 1 tablet by mouth three times a day  #360 x 3    Entered and Authorized by:   Acey Lav MD   Signed by:   Paulette Blanch Dam MD on 05/10/2010   Method used:   Print then Give to Patient   RxID:   [id number]

## 2011-01-21 NOTE — Letter (Signed)
Summary: [facility]  [facility]   Imported By: Roderic Ovens 12/03/2009 11:33:01  _____________________________________________________________________  External Attachment:    Type:   Image     Comment:   External Document

## 2011-01-21 NOTE — Miscellaneous (Signed)
Summary: INITIAL SUMMARY OR PHYSICAL  INITIAL SUMMARY OR PHYSICAL   Imported By: Margie Billet 05/12/2008 14:15:05  _____________________________________________________________________  External Attachment:    Type:   Image     Comment:   External Document

## 2011-01-21 NOTE — Progress Notes (Signed)
Summary: [facility]  Phone Note Outgoing Call   Call placed by: Angelina Ok RN,  March 08, 2008 11:37 AM Call placed to: Specialist Summary of Call: Cal to the Pain Clinic spoke with person their about pt request for an appointment and pain medication.  Person there said that pt had been seen there on 02/07/08 and on 02/14/08 for an injection and was scheduled again for 03/06/08 for another injection and did not keep the appointment.  Pt needs to reschedule an appointment with the Pain Clinic per the per son I spoke with.  Dr.[name] was informed of and agrees that pt needs to schedule an appointment with the Pain Clinic. ..................................................................Marland KitchenAngelina Ok RN  March 08, 2008 11:41 AM   Follow-up for Phone Call        Thanks Venita Sheffield! Follow-up by: Peggye Pitt MD,  March 08, 2008 3:28 PM

## 2011-01-21 NOTE — Consult Note (Signed)
Summary: [facility]  [facility]   Imported By: Florinda Marker 12/12/2008 14:52:11  _____________________________________________________________________  External Attachment:    Type:   Image     Comment:   External Document  Appended Document: [facility]

## 2011-01-21 NOTE — Assessment & Plan Note (Signed)
Summary: EST-CK/FU/MEDS/CFB   Vital Signs:  Patient Profile:   50 Years Old Female Height:     66 inches (167.64 cm) Weight:      252.7 pounds BMI:     40.93 Temp:     97.6 degrees F oral Pulse rate:   70 / minute BP sitting:   130 / 80  (right arm)  Pt. in pain?   yes    Location:   TOE,AND CHEST    Intensity:   6    Type:       aching  Vitals Entered By: Filomena Jungling NT II (September 26, 2008 3:13 PM)              Is Patient Diabetic? Yes  Nutritional Status BMI of 25 - 29 = overweight CBG Result 66  Does patient need assistance? Functional Status Self care Ambulation Normal     PCP:  Peggye Pitt MD  Chief Complaint:  Cold & URI symptoms/ TOE IS BLACK.  History of Present Illness: 50 yo female with PMH of recurrent generalized body abscesses, hypertension, hyperlipidemia, and DM2 came in for right foot little toe darkening that started 3 months ago. It started as a little corn and just got progressively darker. In the beginning it was swollen and red,  painful. Pain and swelling came down but it is still sore but she has "not much feeling on that foot anyways".  Nothing on the other foot. She has had similar ulcer on the bottome of the right foot, on the side of the toes that was also red and swoolen but never turned dark like this one. She was hospitalized in April for diabetic ulcer, she has had I&D done. She has also had wound care this year in April for the same problem. It helped. Pt reports numbness in the right ankle and foot and has been getting worse over the past few months. No difficulty walking,  Denies trauma to the region. No other concerns, no fever, chills, no CP, no SOB, no abdominal or urinary complaints.    Prior Medications Reviewed Using: Patient Recall  Updated Prior Medication List: ATENOLOL 100 MG TABS (ATENOLOL) Take 1 tablet by mouth once a day LISINOPRIL 20 MG TABS (LISINOPRIL) Take 1 tablet by mouth once a day HYDROCHLOROTHIAZIDE 25 MG  TABS (HYDROCHLOROTHIAZIDE) Take 1 tablet by mouth once a day LYRICA 100 MG CAPS (PREGABALIN) Take 1 tablet by mouth three times a day ADVAIR DISKUS 250-50 MCG/DOSE MISC (FLUTICASONE-SALMETEROL) One puff two times a day NOVOLOG MIX 70/30 70-30 % SUSP (INSULIN ASP PROT & ASP (HUM)) 120 units Subcutaneously every am NOVOLOG MIX 70/30 70-30 % SUSP (INSULIN ASP PROT & ASP (HUM)) 65 units subcutaneously every pm TYLENOL EXTRA STRENGTH 500 MG TABS (ACETAMINOPHEN) Take 1 tablet by mouth every 6 hours as needed for pain PRAVACHOL 20 MG  TABS (PRAVASTATIN SODIUM) Take 1 tab by mouth at bedtime PLAVIX 75 MG  TABS (CLOPIDOGREL BISULFATE) Take 1 tablet by mouth once a day LEXAPRO 10 MG  TABS (ESCITALOPRAM OXALATE) Take 1 tablet by mouth once a day  Current Allergies (reviewed today): ! MORPHINE SULFATE (MORPHINE SULFATE) ! * IODINATED CONTRAST  Past Medical History:    Reviewed history from 04/02/2007 and no changes required:       Current Problems:        ABSCESS, PERIRECTAL, HX OF (ICD-V13.3)       RENAL FAILURE, ACUTE, HX OF (ICD-V13.09)       TOBACCO ABUSE (  ICD-305.1)           COPD (ICD-496)       HYPERLIPIDEMIA NEC/NOS (ICD-272.4)           TRANSAMINASES, SERUM, ELEVATED (ICD-790.4)       Diabetes mellitus type II           DIABETIC PERIPHERAL NEUROPATHY (ICD-250.60)           DIABETIC FOOT ULCER, TOE (ICD-250.80)       HYPERTENSION, BENIGN ESSENTIAL (ICD-401.1)          Family History:    Reviewed history from 06/22/2008 and no changes required:       Mother  DM2, CHF, living       Father asthma, living       Brothers and sisters asthma since childhood  Social History:    Reviewed history from 06/22/2008 and no changes required:       Lives with mom at home, currently unemployed   Risk Factors:  Tobacco use:  current    Year started:  years    Cigarettes:  Yes -- /  5 pack(s) per day    Counseled to quit/cut down tobacco use:  yes Passive smoke exposure:  yes Drug use:   no HIV high-risk behavior:  no Alcohol use:  no Exercise:  no Seatbelt use:  100 %  Mammogram History:    Date of Last Mammogram:  04/11/2008  PAP Smear History:    Date of Last PAP Smear:  08/17/2001   Review of Systems  The patient denies fever, chest pain, syncope, peripheral edema, muscle weakness, suspicious skin lesions, and difficulty walking.     Physical Exam  General:     alert and normal appearance.   Lungs:     normal respiratory effort, no intercostal retractions, no accessory muscle use, and normal breath sounds.   Heart:     normal rate, regular rhythm, no murmur, and no gallop.   Abdomen:     soft, non-tender, normal bowel sounds, and no distention.   Msk:     normal ROM, no joint tenderness, and no joint swelling.   Pulses:     R posterior tibial normal, R dorsalis pedis normal, L posterior tibial normal, and L dorsalis pedis normal.   Extremities:     1+ left pedal edema and 1+ right pedal edema.   Neurologic:     decreased sensation to LT on medial and lateral aspect  of the right foot  Diabetes Management Exam:    Foot Exam (with socks and/or shoes not present):       Sensory-Pinprick/Light touch:          Left medial foot (L-4): normal          Left dorsal foot (L-5): normal          Left lateral foot (S-1): diminished          Right medial foot (L-4): diminished          Right dorsal foot (L-5): diminished          Right lateral foot (S-1): diminished       Sensory-Monofilament:          Left foot: diminished          Right foot: diminished       Inspection:          Left foot: normal       Nails:  Left foot: thickened          Right foot: thickened   Foot/Ankle Exam  Foot Exam:    Right:    Inspection:  Normal    Palpation:  Normal    Stability:  stable    Tenderness:  no    Swelling:  no    Erythema:  no    there is ulcer-like lookinf 2 cm round lesion on the little toe of the right foot, surrounded by darker edge 1 cm,  central ulceration, no erythema, no discharge, no tenderness, no signs of infection    Left:    Inspection:  Normal    Palpation:  Normal    Stability:  stable    Tenderness:  no    Swelling:  no    Erythema:  no    Impression & Recommendations:  Problem # 1:  ULCER OF OTHER PART OF FOOT (ICD-707.15) The lesion has central ulceration and the surrounding darkening is likely presentation of calluss. It is less likely diabetic in nature. It certainly does not look infected and there are no systemic signs and symptom present. We will review x-ray study and will also send patient to wound care clinic for further management. No antibitics indicated at this time. Will continue to follow up with patient and will see her next week.  Orders: Diagnostic X-Ray/Fluoroscopy (Diagnostic X-Ray/Flu) Wound Care Center Referral (Wound Care)   Problem # 2:  TOBACCO ABUSE (ICD-305.1) Encouraged smoking cessation and discussed different methods for smoking cessation. Pt still smokes. We have discussed if willing to try nicotine patches and pt expressed willingness to try. Will try to continue to encourage the smoking cessation.  Problem # 3:  DM W/MANIFESTATION NEC, TYPE II, UNCONTROLLED (ICD-250.82) I have examined patient's feet today and there is significant decrease in sensation especially in the right foot and ankle compared to the left one. She has history of diabetic ulcers and I have emphisized the importance of the proper diabetic shoe. I gave perscription for it and I have also referred her to podiatrist for nail care. Will continue to monitor patient and will see her in clinic next week to review her logs of glucose minitor with current regimen. Based on the results will decide on further management. No eye exam due at this time.   Her updated medication list for this problem includes:    Lisinopril 20 Mg Tabs (Lisinopril) .Marland Kitchen... Take 1 tablet by mouth once a day    Novolog Mix 70/30 70-30 % Susp  (Insulin asp prot & asp (hum)) .Marland Kitchen... 120 units subcutaneously every am    Novolog Mix 70/30 70-30 % Susp (Insulin asp prot & asp (hum)) .Marland KitchenMarland KitchenMarland KitchenMarland Kitchen 65 units subcutaneously every pm  Orders: Podiatry Referral (Podiatry) T-Basic Metabolic Panel [phone])  Labs Reviewed: HgBA1c: 9.4 (09/26/2008)   Creat: 0.73 (03/01/2008)   Microalbumin: 0.20 (09/29/2007)   Problem # 4:  HYPERLIPIDEMIA NEC/NOS (ICD-272.4) Recent FLP's done, will continue to monitor response to current medication with Goal in mind LDL <70 given pt's risk factors. No changes to the regimen will be done today.   Her updated medication list for this problem includes:    Pravachol 20 Mg Tabs (Pravastatin sodium) .Marland Kitchen... Take 1 tab by mouth at bedtime  Labs Reviewed: Chol: 183 (06/22/2008)   HDL: 31 (06/22/2008)   LDL: 122 (06/22/2008)   TG: 150 (06/22/2008) SGOT: 19 (03/01/2008)   SGPT: 24 (03/01/2008)   Problem # 5:  HYPERTENSION, BENIGN ESSENTIAL (ICD-401.1) Good control of  BP, will continue to monitor to maintain BP goal <130/80 and will continue current medication regimen.  Her updated medication list for this problem includes:    Atenolol 100 Mg Tabs (Atenolol) .Marland Kitchen... Take 1 tablet by mouth once a day    Lisinopril 20 Mg Tabs (Lisinopril) .Marland Kitchen... Take 1 tablet by mouth once a day    Hydrochlorothiazide 25 Mg Tabs (Hydrochlorothiazide) .Marland Kitchen... Take 1 tablet by mouth once a day  BP today: 130/80 Prior BP: 137/80 (06/22/2008)  Labs Reviewed: Creat: 0.73 (03/01/2008)    Complete Medication List: 1)  Atenolol 100 Mg Tabs (Atenolol) .... Take 1 tablet by mouth once a day 2)  Lisinopril 20 Mg Tabs (Lisinopril) .... Take 1 tablet by mouth once a day 3)  Hydrochlorothiazide 25 Mg Tabs (Hydrochlorothiazide) .... Take 1 tablet by mouth once a day 4)  Lyrica 100 Mg Caps (Pregabalin) .... Take 1 tablet by mouth three times a day 5)  Advair Diskus 250-50 Mcg/dose Misc (Fluticasone-salmeterol) .... One puff two times a day 6)   Novolog Mix 70/30 70-30 % Susp (Insulin asp prot & asp (hum)) .Marland Kitchen.. 120 units subcutaneously every am 7)  Novolog Mix 70/30 70-30 % Susp (Insulin asp prot & asp (hum)) .... 65 units subcutaneously every pm 8)  Tylenol Extra Strength 500 Mg Tabs (Acetaminophen) .... Take 1 tablet by mouth every 6 hours as needed for pain 9)  Pravachol 20 Mg Tabs (Pravastatin sodium) .... Take 1 tab by mouth at bedtime 10)  Plavix 75 Mg Tabs (Clopidogrel bisulfate) .... Take 1 tablet by mouth once a day 11)  Lexapro 10 Mg Tabs (Escitalopram oxalate) .... Take 1 tablet by mouth once a day  Other Orders: T-Hgb A1C (in-house) (41324MW) T- Capillary Blood Glucose (10272)   Patient Instructions: 1)  Please schedule a follow-up appointment in 1 month. 2)  Check your feet each night for sore areas, calluses or signs of infection. 3)  Check your blood sugars regularly. Once before breakfaast, once befor lunch, once before dinner, and after supper. Please write it down and call back the clinic if blood sugar below 70. Take insulin as perscribed. Come back in one week for review of glucose logs and let the nurse know to page me when you get here.    ]  Vital Signs:  Patient Profile:   50 Years Old Female Height:     66 inches (167.64 cm) Weight:      252.7 pounds BMI:     40.93 Temp:     97.6 degrees F oral Pulse rate:   70 / minute BP sitting:   130 / 80    Location:   TOE,AND CHEST    Intensity:   6    Type:       aching             CBG Result 66 Last PAP Result Normal PapHx  Normal (08/17/2001 6:26:06 PM)      Laboratory Results   Blood Tests   Date/Time Received: .Krystal Eaton [facility])  September 26, 2008 4:03 PM  Date/Time Reported: .Krystal Eaton [facility])  September 26, 2008 4:03 PM   HGBA1C: 9.4%   (Normal Range: Non-Diabetic - 3-6%   Control Diabetic - 6-8%) CBG Random:: 66

## 2011-01-21 NOTE — Assessment & Plan Note (Signed)
Summary: FU VISIT FOR DM PER D. RILEY/MAGICK/DS   Vital Signs:  Patient profile:   50 year old female Height:      66 inches (167.64 cm) Weight:      259.4 pounds (117.91 kg) BMI:     42.02 Temp:     97.1 degrees F (36.17 degrees C) oral Pulse rate:   54 / minute BP sitting:   112 / 67  (right arm)  Vitals Entered By: Stanton Kidney Ditzler RN (February 07, 2010 11:27 AM) Is Patient Diabetic? Yes Did you bring your meter with you today? No Pain Assessment Patient in pain? yes     Location: back Intensity: 8 Type: stabbing Onset of pain  past 2-3 years Nutritional Status BMI of > 30 = obese Nutritional Status Detail appetite good CBG Result 250  Have you ever been in a relationship where you felt threatened, hurt or afraid?denies   Does patient need assistance? Functional Status Self care Ambulation Impaired:Risk for fall Comments Uses a cane. Discuss back pain , no help with slleping med and wants handicapped sticker.   Primary Care Provider:  Mliss Sax MD   History of Present Illness: 50 yrs old female with pmh as outlined in the EMR; who comes today to follow on her DM, HTN and HLD. Patient is complaining of back pain and reports that PT, NSAID and muscle relaxants have not helped her. She had facet arthrpopaty and has used narcotics in the past and steroid shots in the past w/o significant relief.  Patient is otherwise doing great and reports to be compliant with her medicaitons.  Depression History:      The patient denies a depressed mood most of the day and a diminished interest in her usual daily activities.        The patient denies that she feels like life is not worth living, denies that she wishes that she were dead, and denies that she has thought about ending her life.         Preventive Screening-Counseling & Management  Alcohol-Tobacco     Alcohol drinks/day: 0     Smoking Status: current     Smoking Cessation Counseling: yes     Packs/Day: 1 ppd  Year Started: years     Passive Smoke Exposure: yes  Caffeine-Diet-Exercise     Does Patient Exercise: no  Problems Prior to Update: 1)  Hyperlipidemia Nec/nos  (ICD-272.4) 2)  Hypertension, Benign Essential  (ICD-401.1) 3)  Hypertension  (ICD-401.9) 4)  Chest Pain  (ICD-786.50) 5)  Insomnia  (ICD-780.52) 6)  Candidiasis, Vaginal  (ICD-112.1) 7)  Hip Pain, Right  (ICD-719.45) 8)  Boils, Recurrent  (ICD-680.9) 9)  Diab W/neuro Manifests Type Ii/uns Not Uncntrl  (ICD-250.60) 10)  Depression  (ICD-311) 11)  Diabetic Foot Ulcer, Right  (ICD-250.80) 12)  Claudication  (ICD-443.9) 13)  Dm W/manifestation Nec, Type II, Uncontrolled  (ICD-250.82) 14)  Tobacco Abuse  (ICD-305.1) 15)  COPD  (ICD-496) 16)  Transaminases, Serum, Elevated  (ICD-790.4) 17)  Diabetic Peripheral Neuropathy  (ICD-250.60) 18)  Knee Pain  (ICD-719.46) 19)  Fracture, Toe, Left  (ICD-826.0) 20)  Diabetic Foot Ulcer, Left  (ICD-250.80) 21)  Inadequate Material Resources  (ICD-V60.2) 22)  Nausea  (ICD-787.02) 23)  Weight Loss  (ICD-783.21) 24)  Dysphagia Unspecified  (ICD-787.20) 25)  Gastroesophageal Reflux Disease  (ICD-530.81) 26)  Ulcer of Other Part of Foot  (ICD-707.15) 27)  Other&unspecified Diseases The Oral Soft Tissues  (ICD-528.9) 28)  Abscess, Skin  (ICD-682.9) 29)  Bell's Palsy  (ICD-351.0) 30)  Back Pain  (ICD-724.5) 31)  Health Screening  (ICD-V70.0) 32)  Abscess, Perirectal, Hx of  (ICD-V13.3) 33)  Renal Failure, Acute, Hx of  (ICD-V13.09) 34)  Glaucoma  (ICD-365.9) 35)  Disc Disease, Lumbar  (ICD-722.52)  Current Problems (verified): 1)  Hyperlipidemia Nec/nos  (ICD-272.4) 2)  Hypertension, Benign Essential  (ICD-401.1) 3)  Hypertension  (ICD-401.9) 4)  Chest Pain  (ICD-786.50) 5)  Insomnia  (ICD-780.52) 6)  Candidiasis, Vaginal  (ICD-112.1) 7)  Hip Pain, Right  (ICD-719.45) 8)  Boils, Recurrent  (ICD-680.9) 9)  Diab W/neuro Manifests Type Ii/uns Not Uncntrl  (ICD-250.60) 10)   Depression  (ICD-311) 11)  Diabetic Foot Ulcer, Right  (ICD-250.80) 12)  Claudication  (ICD-443.9) 13)  Dm W/manifestation Nec, Type II, Uncontrolled  (ICD-250.82) 14)  Tobacco Abuse  (ICD-305.1) 15)  COPD  (ICD-496) 16)  Transaminases, Serum, Elevated  (ICD-790.4) 17)  Diabetic Peripheral Neuropathy  (ICD-250.60) 18)  Knee Pain  (ICD-719.46) 19)  Fracture, Toe, Left  (ICD-826.0) 20)  Diabetic Foot Ulcer, Left  (ICD-250.80) 21)  Inadequate Material Resources  (ICD-V60.2) 22)  Nausea  (ICD-787.02) 23)  Weight Loss  (ICD-783.21) 24)  Dysphagia Unspecified  (ICD-787.20) 25)  Gastroesophageal Reflux Disease  (ICD-530.81) 26)  Ulcer of Other Part of Foot  (ICD-707.15) 27)  Other&unspecified Diseases The Oral Soft Tissues  (ICD-528.9) 28)  Abscess, Skin  (ICD-682.9) 29)  Bell's Palsy  (ICD-351.0) 30)  Back Pain  (ICD-724.5) 31)  Health Screening  (ICD-V70.0) 32)  Abscess, Perirectal, Hx of  (ICD-V13.3) 33)  Renal Failure, Acute, Hx of  (ICD-V13.09) 34)  Glaucoma  (ICD-365.9) 35)  Disc Disease, Lumbar  (ICD-722.52)  Medications Prior to Update: 1)  Atenolol 100 Mg Tabs (Atenolol) .... Take 1 Tablet By Mouth Once A Day 2)  Lisinopril 20 Mg Tabs (Lisinopril) .... Take 1 Tablet By Mouth Once A Day 3)  Hydrochlorothiazide 25 Mg Tabs (Hydrochlorothiazide) .... Take 1 Tablet By Mouth Once A Day 4)  Advair Diskus 250-50 Mcg/dose Misc (Fluticasone-Salmeterol) .... One Puff Two Times A Day 5)  Tylenol Extra Strength 500 Mg Tabs (Acetaminophen) .... Take 1 Tablet By Mouth Every 6 Hours As Needed For Pain 6)  Plavix 75 Mg  Tabs (Clopidogrel Bisulfate) .... Take 1 Tablet By Mouth Once A Day 7)  Ra Pen Needles 31g X 8 Mm Misc (Insulin Pen Needle) .... Use 3 Times Per Day To Check The Sugar Level 8)  Lyrica 50 Mg Caps (Pregabalin) .... Take 1 Capsule By Mouth Three Times A Day 9)  Amitriptyline Hcl 75 Mg Tabs (Amitriptyline Hcl) .... Take 1 Tablet By Mouth Two Times A Day 10)  Lyrica 100 Mg Caps  (Pregabalin) .... Take 1 Tablet By Mouth Three Times A Day 11)  Tramadol Hcl 50 Mg Tabs (Tramadol Hcl) .... One Tab Up To Four Times A Day For Pain 12)  Doxycycline Hyclate 100 Mg Caps (Doxycycline Hyclate) .... Take 1 Tablet By Mouth Two Times A Day 13)  Humalog Mix 75/25 Pen 75-25 % Susp (Insulin Lispro Prot & Lispro) .... Inject Under The Skin 110 Units in The Am and 55 Units in The Evening Every Day 14)  Diflucan 150 Mg Tabs (Fluconazole) .... Take 1 Tablet Today and Then Stop 15)  Ambien 10 Mg Tabs (Zolpidem Tartrate) .... Take 1 Tablet Once At Bedtime As Needed For Insomnia 16)  Truetrack Test  Strp (Glucose Blood) .... Use To Check Blood Sugar Twice Daily 17)  Lancets  Misc (Lancets) .Marland KitchenMarland KitchenMarland Kitchen  Use To Check Blood Sugar Twice Daily 18)  Nitrostat 0.4 Mg Subl (Nitroglycerin) .... Take One Tablet Under Your Tongue With Severe Chest Pain, Repeat in 5 Minutes. Attend To Doctor/hospital If Had To Take More Then 3 in 20 Minutes  Current Medications (verified): 1)  Atenolol 100 Mg Tabs (Atenolol) .... Take 1 Tablet By Mouth Once A Day 2)  Lisinopril 20 Mg Tabs (Lisinopril) .... Take 1 Tablet By Mouth Once A Day 3)  Hydrochlorothiazide 25 Mg Tabs (Hydrochlorothiazide) .... Take 1 Tablet By Mouth Once A Day 4)  Advair Diskus 250-50 Mcg/dose Misc (Fluticasone-Salmeterol) .... One Puff Two Times A Day 5)  Tylenol Extra Strength 500 Mg Tabs (Acetaminophen) .... Take 1 Tablet By Mouth Every 6 Hours As Needed For Pain 6)  Plavix 75 Mg  Tabs (Clopidogrel Bisulfate) .... Take 1 Tablet By Mouth Once A Day 7)  Ra Pen Needles 31g X 8 Mm Misc (Insulin Pen Needle) .... Use 3 Times Per Day To Check The Sugar Level 8)  Amitriptyline Hcl 75 Mg Tabs (Amitriptyline Hcl) .... Take 1 Tablet By Mouth Two Times A Day 9)  Lyrica 100 Mg Caps (Pregabalin) .... Take 1 Tablet By Mouth Three Times A Day 10)  Tramadol Hcl 50 Mg Tabs (Tramadol Hcl) .... One Tab Up To Four Times A Day For Pain 11)  Humalog Mix 75/25 Pen 75-25 % Susp  (Insulin Lispro Prot & Lispro) .... Inject Under The Skin 110 Units in The Am and 55 Units in The Evening Every Day 12)  Ambien 10 Mg Tabs (Zolpidem Tartrate) .... Take 1 Tablet Once At Bedtime As Needed For Insomnia 13)  Truetrack Test  Strp (Glucose Blood) .... Use To Check Blood Sugar Twice Daily 14)  Lancets  Misc (Lancets) .... Use To Check Blood Sugar Twice Daily 15)  Nitrostat 0.4 Mg Subl (Nitroglycerin) .... Take One Tablet Under Your Tongue With Severe Chest Pain, Repeat in 5 Minutes. Attend To Doctor/hospital If Had To Take More Then 3 in 20 Minutes  Allergies: 1)  ! Morphine Sulfate (Morphine Sulfate) 2)  ! 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   Social History: Last updated: 11/30/2009 Lives with daughter-in-law in apartment,  currently unemployed Divorced  Tobacco Use - Yes.  Alcohol Use - no  Risk Factors: Alcohol Use: 0 (02/07/2010) Exercise: no (02/07/2010)  Risk Factors: Smoking Status: current (02/07/2010) Packs/Day: 1 ppd (02/07/2010) Passive Smoke Exposure: yes (02/07/2010)  Social History: Packs/Day:  1 ppd  Review of Systems       As per HPI.  Physical Exam  General:  Well-developed,well-nourished,in no acute distress; alert,appropriate and cooperative throughout examination Lungs:  Normal respiratory effort, chest expands symmetrically. Lungs are clear to auscultation, no crackles or wheezes. Heart:  Normal rate and regular rhythm. S1 and S2 normal without gallop, murmur, click, rub or other extra sounds. Abdomen:  Bowel sounds positive,abdomen soft and non-tender without masses, organomegaly or hernias noted. Msk:  No deformity or scoliosis noted of thoracic or lumbar spine.  Tanderness to palpation on her thorcaci spine; patient reports pain with movement as well. Extremities:  1+ left pedal edema and 1+ right pedal edema.   Neurologic:  No cranial nerve deficits noted. Station and gait are normal. Plantar reflexes are down-going bilaterally. DTRs are symmetrical throughout. Sensory, motor and coordinative functions appear intact.   Impression & Recommendations:  Problem # 1:  DM W/MANIFESTATION NEC, TYPE II, UNCONTROLLED (ICD-250.82) Mild improvemnt of her diabetes with A1C down from 10.2 to 9.8; Will continue current insulin regimen and will add metformin twice a day (patient will climb dose slowly over the next two weeks until reaching 100mg  two times a day dose); patient was also advised to follow a low crab diet (less than 70 grams daily). Will repeat A1C in 3 months.   Her updated medication list for this problem includes:    Lisinopril 20 Mg Tabs (Lisinopril) .Marland Kitchen... Take 1 tablet by mouth once a day    Humalog Mix 75/25 Pen 75-25 % Susp (Insulin lispro prot & lispro) ..... Inject  under the skin 110 units in the am and 55 units in the evening every day    Metformin Hcl 1000 Mg Tabs (Metformin hcl) .Marland Kitchen... Take 1 tablet by mouth two times a day  Orders: T- Capillary Blood Glucose ([id number]) T-Hgb A1C (in-house) ([id number])  Labs Reviewed: Creat: 0.90 (09/14/2009)     Last Eye Exam: No diabetic retinopathy.    (05/31/2008) Reviewed HgBA1c results: 9.8 (02/07/2010)  10.2 (10/25/2009)  Problem # 2:  HYPERTENSION, BENIGN ESSENTIAL (ICD-401.1) At goal and well controlled. Will continue current regimen and will advised her to follow a low sodium diet.  Her updated medication list for this problem includes:    Atenolol 100 Mg Tabs (Atenolol) .Marland Kitchen... Take 1 tablet by mouth once a day    Lisinopril 20 Mg Tabs (Lisinopril) .Marland Kitchen... Take 1 tablet by mouth once a day    Hydrochlorothiazide 25 Mg Tabs (Hydrochlorothiazide) .Marland Kitchen... Take 1 tablet by mouth once a day  BP today: 112/67 Prior BP: 124/78 (11/30/2009)  Labs Reviewed: K+: 3.9 (09/14/2009) Creat: : 0.90 (09/14/2009)   Chol: 171 (11/24/2008)   HDL: 30 (11/24/2008)   LDL: 79 (11/24/2008)   TG: 310 (11/24/2008)  Problem # 3:  HYPERLIPIDEMIA NEC/NOS (ICD-272.4) Essentially at goal. LDL 109; patient with mild elevation of her LFT's. Will recommend to folow a low fat diet and will check her lipid profile in 3 months. At that moment will also check again her LFt's and if back to normal or stable and in need of statins will start pravachol.   Orders: T-Lipid Profile [id number])  Labs Reviewed: SGOT: 65 (09/14/2009)   SGPT: 65 (09/14/2009)   HDL:30 (11/24/2008), 31 (06/22/2008)  LDL:79 (11/24/2008), 122 (95/28/4132)  Chol:171 (11/24/2008), 183 (06/22/2008)  Trig:310 (11/24/2008), 150 (06/22/2008)  Problem # 4:  BACK PAIN (ICD-724.5) Secondary to facet arthropathy. Will start patient on tramadol for pain control and will also prescribed lidoderm patch.  Her updated medication list for this problem includes:    Tylenol Extra  Strength 500 Mg Tabs (Acetaminophen) .Marland Kitchen... Take 1 tablet by mouth every 6 hours as needed for  pain    Tramadol Hcl 50 Mg Tabs (Tramadol hcl) ..... One tab up to four times a day for pain  Problem # 5:  INSOMNIA (ICD-780.52) Patient is not responding to [place]. will start patient on restoril for insomnia and will follow on her symptoms in 3-4 weeks.  The following medications were removed from the medication list:    Ambien 10 Mg Tabs (Zolpidem tartrate) .Marland Kitchen... Take 1 tablet once at bedtime as needed for insomnia Her updated medication list for this problem includes:    Restoril 7.5 Mg Caps (Temazepam) .Marland Kitchen... Take 1 tab by mouth at bedtime as needed to fall sleep  Complete Medication List: 1)  Atenolol 100 Mg Tabs (Atenolol) .... Take 1 tablet by mouth once a day 2)  Lisinopril 20 Mg Tabs (Lisinopril) .... Take 1 tablet by mouth once a day 3)  Hydrochlorothiazide 25 Mg Tabs (Hydrochlorothiazide) .... Take 1 tablet by mouth once a day 4)  Advair Diskus 250-50 Mcg/dose Misc (Fluticasone-salmeterol) .... One puff two times a day 5)  Tylenol Extra Strength 500 Mg Tabs (Acetaminophen) .... Take 1 tablet by mouth every 6 hours as needed for pain 6)  Plavix 75 Mg Tabs (Clopidogrel bisulfate) .... Take 1 tablet by mouth once a day 7)  Ra Pen Needles 31g X 8 Mm Misc (Insulin pen needle) .... Use 3 times per day to check the sugar level 8)  Amitriptyline Hcl 75 Mg Tabs (Amitriptyline hcl) .... Take 1 tablet by mouth two times a day 9)  Lyrica 100 Mg Caps (Pregabalin) .... Take 1 tablet by mouth three times a day 10)  Tramadol Hcl 50 Mg Tabs (Tramadol hcl) .... One tab up to four times a day for pain 11)  Humalog Mix 75/25 Pen 75-25 % Susp (Insulin lispro prot & lispro) .... Inject under the skin 110 units in the am and 55 units in the evening every day 12)  Truetrack Test Strp (Glucose blood) .... Use to check blood sugar twice daily 13)  Lancets Misc (Lancets) .... Use to check blood sugar twice  daily 14)  Nitrostat 0.4 Mg Subl (Nitroglycerin) .... Take one tablet under your tongue with severe chest pain, repeat in 5 minutes. attend to doctor/hospital if had to take more then 3 in 20 minutes 15)  Lidoderm 5 % Ptch (Lidocaine) .... Apply two times a day in the affected area. 16)  Restoril 7.5 Mg Caps (Temazepam) .... Take 1 tab by mouth at bedtime as needed to fall sleep 17)  Metformin Hcl 1000 Mg Tabs (Metformin hcl) .... Take 1 tablet by mouth two times a day  Other Orders: T-Comprehensive Metabolic Panel ([id number])  Patient Instructions: 1)  Please schedule a follow-up appointment in 3 months. 2)  Take your medications as prescribed. 3)  Apply patch two times a day every 12 hours. 4)  Most patients (90%) with low back pain will improve with time (2-6 weeks). Keep active but avoid activities that are painful. Apply moist heat and/or ice to lower back several times a day. 5)  You will be called with any abnormalities in the tests scheduled or performed today.  If you don't hear from [place] within a week from when the test was performed, you can assume that your test was normal. 6)  Check your blood sugars regularly. If your readings are usually above : 250 or below 70 you should contact our office. 7)  Check your feet each night for sore areas, calluses or signs of infection. Prescriptions:  LIDODERM 5 % PTCH (LIDOCAINE) Apply two times a day in the affected area.  #30 x 1   Entered and Authorized by:   Vassie Loll MD   Signed by:   Vassie Loll MD on 02/12/2010   Method used:   Print then Give to Patient   RxID:   [id number] METFORMIN HCL 1000 MG TABS (METFORMIN HCL) Take 1 tablet by mouth two times a day  #62 x 3   Entered and Authorized by:   Vassie Loll MD   Signed by:   Vassie Loll MD on 02/07/2010   Method used:   Print then Give to Patient   RxID:   [id number] RESTORIL 7.5 MG CAPS (TEMAZEPAM) Take 1 tab by mouth at bedtime as needed to fall sleep  #30 x  1   Entered and Authorized by:   Vassie Loll MD   Signed by:   Vassie Loll MD on 02/07/2010   Method used:   Print then Give to Patient   RxID:   [id number] FLECTOR 1.3 % PTCH (DICLOFENAC EPOLAMINE) Aplly on affected area two times a day  #1 x 2   Entered and Authorized by:   Vassie Loll MD   Signed by:   Vassie Loll MD on 02/07/2010   Method used:   Print then Give to Patient   RxID:   [id number]  Process Orders Check Orders Results:     [facility]: ABN not required for this insurance Tests Sent for requisitioning (February 12, 2010 12:07 PM):     02/07/2010: [facility] -- T-Comprehensive Metabolic Panel [[id number]] (signed)     02/07/2010: [facility] -- T-Lipid Profile [id number] (signed)    Prevention & Chronic Care Immunizations   Influenza vaccine: Not documented   Influenza vaccine deferral: Refused  (09/14/2009)    Tetanus booster: Not documented   Td booster deferral: Not indicated  (10/25/2009)    Pneumococcal vaccine: Not documented  Colorectal Screening   Hemoccult: Not documented   Hemoccult action/deferral: Not indicated  (10/25/2009)    Colonoscopy: Not documented   Colonoscopy action/deferral: Not indicated  (10/25/2009)  Other Screening   Pap smear: Normal  (08/17/2001)   Pap smear action/deferral: Deferred-3 yr interval  (10/25/2009)    Mammogram: No specific mammographic evidence of malignancy.  Assessment: BIRADS 1.   (04/11/2008)   Mammogram action/deferral: Ordered  (10/25/2009)   Mammogram due: 11/09/2009   Smoking status: current  (02/07/2010)   Smoking cessation counseling: yes  (02/07/2010)  Diabetes Mellitus   HgbA1C: 9.8  (02/07/2010)   HgbA1C action/deferral: Ordered  (09/14/2009)    Eye exam: No diabetic retinopathy.     (05/31/2008)   Eye exam due: 05/2009    Foot exam: yes  (10/25/2009)   High risk foot: Not documented   Foot care education: Not  documented    Urine microalbumin/creatinine ratio: 9.4  (09/14/2009)   Urine microalbumin action/deferral: Ordered  Lipids   Total Cholesterol: 171  (11/24/2008)   LDL: 79  (11/24/2008)   LDL Direct: Not documented   HDL: 30  (11/24/2008)   Triglycerides: 310  (11/24/2008)    SGOT (AST): 65  (09/14/2009)   SGPT (ALT): 65  (09/14/2009) CMP ordered    Alkaline phosphatase: 93  (09/14/2009)   Total bilirubin: 0.3  (09/14/2009)  Hypertension   Last Blood Pressure: 112 / 67  (02/07/2010)   Serum creatinine: 0.90  (09/14/2009)   Serum potassium 3.9  (09/14/2009) CMP ordered   Self-Management Support :  Personal Goals (by the next clinic visit) :     Personal A1C goal: 7  (09/14/2009)     Personal blood pressure goal: 130/80  (09/14/2009)     Personal LDL goal: 100  (09/14/2009)    Patient will work on the following items until the next clinic visit to reach self-care goals:     Medications and monitoring: take my medicines every day, check my blood sugar, examine my feet every day  (02/07/2010)     Eating: eat more vegetables, use fresh or frozen vegetables, eat fruit for snacks and desserts, limit or avoid alcohol  (02/07/2010)     Activity: take a 30 minute walk every day  (02/07/2010)    Diabetes self-management support: Copy of home glucose meter record  (02/07/2010)   Last diabetes self-management training by diabetes educator: 11/27/2009    Hypertension self-management support: Written self-care plan  (09/14/2009)    Lipid self-management support: Written self-care plan  (09/14/2009)   Laboratory Results   Blood Tests   Date/Time Received: February 07, 2010 12:01 PM Date/Time Reported: Alric Quan  February 07, 2010 12:01 PM   HGBA1C: 9.8%   (Normal Range: Non-Diabetic - 3-6%   Control Diabetic - 6-8%) CBG Random:: 250mg /dL

## 2011-01-21 NOTE — Assessment & Plan Note (Signed)
Summary: f/u from last visit [mkj]   Vital Signs:  Patient Profile:   50 Years Old Female Height:     66 inches (167.64 cm) Weight:      253.04 pounds (115.02 kg) BMI:     40.99 Temp:     97.1 degrees F (36.17 degrees C) oral Pulse rate:   67 / minute BP sitting:   112 / 75  (right arm)  Pt. in pain?   yes    Location:   back    Intensity:   10    Type:       aching, stabbing  Vitals Entered By: Angelina Ok RN (March 01, 2008 10:16 AM)              Is Patient Diabetic? Yes  Nutritional Status BMI of > 30 = obese CBG Result 261  Have you ever been in a relationship where you felt threatened, hurt or afraid?No   Does patient need assistance? Functional Status Self care Ambulation Impaired:Risk for fall    \   PCP:  Peggye Pitt MD  Chief Complaint:  Back Pain.  History of Present Illness: Still complaining of significant back pain, despite injection given at the pain clinic. Apparently she was told that there was nothing else they could do for her and refused to see her in followup. 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 She was unable to afford the naproxen I had prescribed ($219!!).    Prior Medication List:  ATENOLOL 100 MG TABS (ATENOLOL) Take 1 tablet by mouth once a day LISINOPRIL 20 MG TABS (LISINOPRIL) Take 1 tablet by mouth once a day HYDROCHLOROTHIAZIDE 25 MG TABS (HYDROCHLOROTHIAZIDE) Take 1 tablet by mouth once a day LYRICA 100 MG CAPS (PREGABALIN) Take 1 tablet by mouth three times a day ADVAIR DISKUS 250-50 MCG/DOSE MISC (FLUTICASONE-SALMETEROL) One puff two times a day ALBUTEROL 90 MCG/ACT AERS (ALBUTEROL) one puff every 6 hrs as needed for wheezing NOVOLOG MIX 70/30 70-30 % SUSP (INSULIN ASP PROT & ASP (HUM)) 100 units Subcutaneously every am NOVOLOG MIX 70/30 70-30 % SUSP (INSULIN ASP PROT & ASP (HUM)) 50 units subcutaneously every pm NAPROSYN 500 MG  TABS (NAPROXEN) Take 1 tablet by mouth two times a day TYLENOL EXTRA STRENGTH 500 MG TABS (ACETAMINOPHEN) Take 1 tablet by mouth every 6 hours as needed for pain PRAVACHOL 20 MG  TABS (PRAVASTATIN SODIUM) Take 1 tab by mouth at bedtime PLAVIX 75 MG  TABS (CLOPIDOGREL BISULFATE) Take 1 tablet by mouth once a day   Current Allergies: ! MORPHINE SULFATE (MORPHINE SULFATE) ! * IODINATED CONTRAST    Risk Factors: Tobacco use:  current    Year started:  years Alcohol use:  no Exercise:  no Seatbelt use:  100 %  Mammogram History:    Date of Last Mammogram:  07/26/2004  PAP Smear History:    Date of Last PAP Smear:  08/17/2001   Review of Systems  The patient denies fever, weight loss, chest pain, dyspnea on exhertion, abdominal pain, melena, hematochezia, and severe indigestion/heartburn.     Physical Exam  General:     Well-developed,well-nourished,in no acute distress; alert,appropriate and cooperative throughout examination, obese. Neck:     No bruits/goiter Lungs:     Normal respiratory effort, chest expands symmetrically. Lungs are clear to auscultation, no crackles or wheezes. Heart:     Normal rate and regular rhythm. S1  and S2 normal without gallop, murmur, click, rub or other extra sounds. Abdomen:     Bowel sounds positive,abdomen soft and non-tender without masses, organomegaly or hernias noted. Extremities:     No clubbing, cyanosis, edema.Black spot on plantar aspect of right foot.    Impression & Recommendations:  Problem # 1:  BACK PAIN (ICD-724.5) Will give her a trial of darvocet.Will also call the pain clinic and see why they refuse to see her. Will schedule a return appointment in 1 month to see if any improvement in pain with darvocet. She is also on Lyrica.  Her updated medication list for this problem includes:    Darvocet-n 100 100-650 Mg Tabs (Propoxyphene n-apap) .Marland Kitchen... Take 1 -2 pills every 4 hours as needed for back pain.    Tylenol Extra  Strength 500 Mg Tabs (Acetaminophen) .Marland Kitchen... Take 1 tablet by mouth every 6 hours as needed for pain   Problem # 2:  DM W/MANIFESTATION NEC, TYPE II, UNCONTROLLED (ICD-250.82) Very uncontrolled. Will increase 70/30 to 110 in the am and 60 in the pm. She will return in 1 month for followup. Will need to schedule eye exam on her next visit. Will refer to podiatry given diabetes and "black spot" on right foot.  Her updated medication list for this problem includes:    Lisinopril 20 Mg Tabs (Lisinopril) .Marland Kitchen... Take 1 tablet by mouth once a day    Novolog Mix 70/30 70-30 % Susp (Insulin asp prot & asp (hum)) .Marland Kitchen... 110 units subcutaneously every am    Novolog Mix 70/30 70-30 % Susp (Insulin asp prot & asp (hum)) .Marland KitchenMarland KitchenMarland KitchenMarland Kitchen 60 units subcutaneously every pm  Orders: T- Capillary Blood Glucose ([id number]) T-Hgb A1C (in-house) ([id number]) Podiatry Referral (Podiatry)  Labs Reviewed: HgBA1c: 9.5 (03/01/2008)   Creat: 0.75 (01/24/2008)      Complete Medication List: 1)  Atenolol 100 Mg Tabs (Atenolol) .... Take 1 tablet by mouth once a day 2)  Lisinopril 20 Mg Tabs (Lisinopril) .... Take 1 tablet by mouth once a day 3)  Hydrochlorothiazide 25 Mg Tabs (Hydrochlorothiazide) .... Take 1 tablet by mouth once a day 4)  Lyrica 100 Mg Caps (Pregabalin) .... Take 1 tablet by mouth three times a day 5)  Advair Diskus 250-50 Mcg/dose Misc (Fluticasone-salmeterol) .... One puff two times a day 6)  Albuterol 90 Mcg/act Aers (Albuterol) .... One puff every 6 hrs as needed for wheezing 7)  Novolog Mix 70/30 70-30 % Susp (Insulin asp prot & asp (hum)) .Marland Kitchen.. 110 units subcutaneously every am 8)  Novolog Mix 70/30 70-30 % Susp (Insulin asp prot & asp (hum)) .... 60 units subcutaneously every pm 9)  Darvocet-n 100 100-650 Mg Tabs (Propoxyphene n-apap) .... Take 1 -2 pills every 4 hours as needed for back pain. 10)  Tylenol Extra Strength 500 Mg Tabs (Acetaminophen) .... Take 1 tablet by mouth every 6 hours as needed for pain 11)   Pravachol 20 Mg Tabs (Pravastatin sodium) .... Take 1 tab by mouth at bedtime 12)  Plavix 75 Mg Tabs (Clopidogrel bisulfate) .... Take 1 tablet by mouth once a day  Other Orders: T-Comprehensive Metabolic Panel ([id number])   Patient Instructions: 1)  Please schedule a follow-up appointment in 1 month. 2)  Increase your insulin to 110 units in the morning and 60 units at bedtime. 3)  Bring your meter in for your next visit in 1 month. 4)  Use the darvocet 1 to 2 pills every 4 hours as needed  for pain.  Prescriptions: DARVOCET-N 100 100-650 MG  TABS (PROPOXYPHENE N-APAP) Take 1 -2 pills every 4 hours as needed for back pain.  #240 x 0   Entered and Authorized by:   Peggye Pitt MD   Signed by:   Peggye Pitt MD on 03/01/2008   Method used:   Print then Give to Patient   RxID:   [id number]  ] Laboratory Results   Blood Tests   Date/Time Recieved: March 01, 2008 10:47 AM  Date/Time Reported: ..................................................................Marland KitchenOren Beckmann  March 01, 2008 10:47 AM   HGBA1C: 9.5%   (Normal Range: Non-Diabetic - 3-6%   Control Diabetic - 6-8%) CBG Random: 261

## 2011-01-21 NOTE — Progress Notes (Signed)
Summary: Pain Clinic  Phone Note Call from Patient   Caller: Patient Call For: Peggye Pitt MD Summary of Call: Call from pt said she did not get message to call the Pain Clinic.  Pt was asked to call the Pain Clinics for  return appointment as soon as possible.  Pt said that she did not know that she needed to keep the appointment at the Pain Clinics since she was seen here last week for her Pain.  Pt told that Dr. Ardyth Harps wants her to continue with the [facility].  A call to pain Clinic's they said that pt had missed her appointment on 03/06/08 and should calll to reschedule the missed appointment. Pt voiced understanding of.  Pt asked questions about applying for Disability due to her pain.  She willl contact Social Security to look into this. ..................................................................Marland KitchenAngelina Ok RN  March 09, 2008 4:05 PM  Initial call taken by: Angelina Ok RN,  March 09, 2008 4:00 PM Call placed by: Angelina Ok RN,  March 09, 2008 3:56 PM Call placed to: Patient

## 2011-01-21 NOTE — Progress Notes (Signed)
Summary: Refill/gh  Phone Note Refill Request Message from:  Fax from Pharmacy on Apr 25, 2010 3:27 PM  Refills Requested: Medication #1:  DIFLUCAN 100 MG TABS Take 1 tablet by mouth two times a day.   Last Refilled: 03/25/2010  Method Requested: Fax to Local Pharmacy Initial call taken by: Angelina Ok RN,  Apr 25, 2010 3:27 PM  Follow-up for Phone Call        completed refill, thank you Shakeya Kerkman  Follow-up by: Mliss Sax MD,  Apr 26, 2010 11:19 AM    Prescriptions: DIFLUCAN 100 MG TABS (FLUCONAZOLE) Take 1 tablet by mouth two times a day  #10 x 0   Entered and Authorized by:   Mliss Sax MD   Signed by:   Mliss Sax MD on 04/26/2010   Method used:   Faxed to ...       [facility] Department (retail)       [address] [place], [place]  [address]       Ph: [phone]       Fax: [phone]   RxID:   [id number] DIFLUCAN 100 MG TABS (FLUCONAZOLE) Take 1 tablet by mouth two times a day  #10 x 0   Entered and Authorized by:   Mliss Sax MD   Signed by:   Mliss Sax MD on 04/26/2010   Method used:   Print then Give to Patient   RxID:   [id number]

## 2011-01-21 NOTE — Assessment & Plan Note (Signed)
Summary: FU ED FRACTURE/LACERATIONS OF FOOT PER ED   Vital Signs:  Patient profile:   50 year old female Pulse rate:   62 / minute BP sitting:   147 / 85  (right arm)  Vitals Entered By: Terese Door (July 16, 2010 3:44 PM) CC: F/U ED visit-fracture/lacertions to right foot   Primary Provider:  Mliss Sax MD   History of Present Illness: 50yo female w/ PMH diabetes to office with R toe fx.  Was making bed yesterday AM & noticed blood on floor, went to [facility] ER where they did x-rays & noted a fx of R 2nd toe.  Pt does not recall injury or trauma yesterday, although does have hx of diabetic peripheral neuropathy.  No previous injury to that foot.  Given Rx for antibiotics - bactrim, but pt has not picked up yet b/c does not have the money.  She was also given post-op shoe.  Does have toe/foot pain, no significant swelling.  Denies fevers/chills.    Allergies: 1)  ! Morphine Sulfate (Morphine Sulfate) 2)  ! 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  Alcohol Use - no  Review of Systems General:  Denies chills, fatigue, and fever. CV:  Denies leg cramps with exertion, swelling of feet, and swelling of hands. MS:  Complains of joint pain; denies joint redness and joint swelling. Derm:  Complains of lesion(s); denies changes in color of skin, changes in nail beds, and poor wound healing. Neuro:  Complains of numbness; denies falling down, poor balance, tingling, and weakness.  Physical Exam  General:  alert, appropriate dress, and normal appearance.   Msk:  FOOT: R foot with TTP over MTP joint of 2nd toe, mild tenderness across all metatarsal heads.  No ecchymosis, minimal swelling.  Normal ROM of all toes.  Plantar aspect of R foot revealed ulceration at base of R 3rd toe, no bleeding or discharge, no surrounding erythema.  Toe nails were hyperkeratotic.   Pulses:  +1/4 dorsalis pedis & posterior tib b/l Extremities:  trace edema LE b/l Neurologic:  sensation intact to light touch Additional Exam:  Imaging: X-rays from 07/15/10 personally reviewed revealing fx of right proximal 2nd phalynx   Impression & Recommendations:  Problem # 1:  CLOSED FRACTURE OF ONE OR MORE PHALANGES OF FOOT (ICD-826.0) - x-rays confirm fx of proximal 2nd phalynx on R foot - cont. post-op shoe for comfort  Problem # 2:  FOOT ULCER, RIGHT (ICD-707.10) - Area cleaned in office with peroxide.  Wound dressed with bacitracin, telfa, 2x2, & ace wrap.  Should keep area clean & dry. - Rx for cephalexin given - 500mg  two times a day x 10-days.  Should not take bactrim - Should monitor for  any signs of infection including increased redness. increased pain, purulent discharge - f/u Thurs 07/18/10 in AM for re-evaluation  Complete Medication List: 1)  Atenolol 100 Mg Tabs (Atenolol) .... Take 1 tablet by mouth once a day 2)   Lisinopril 20 Mg Tabs (Lisinopril) .... Take 1 tablet by mouth once a day 3)  Hydrochlorothiazide 25 Mg Tabs (Hydrochlorothiazide) .... Take 1 tablet by mouth once a day 4)  Advair Diskus 250-50 Mcg/dose Misc (Fluticasone-salmeterol) .... One puff two times a day 5)  Plavix 75 Mg Tabs (Clopidogrel bisulfate) .... Take 1 tablet by mouth once a day 6)  Ra Pen Needles 31g X 8 Mm Misc (Insulin pen needle) .... Use 3 times per day to check the sugar level 7)  Lyrica 100 Mg Caps (Pregabalin) .... Take 1 tablet by mouth three times a day 8)  Tramadol Hcl 50 Mg Tabs (Tramadol hcl) .... One tab up to four times a day for pain 9)  Humalog Mix 75/25 Pen 75-25 % Susp (Insulin lispro prot & lispro) .... Inject under the skin 110 units in the am and 55 units in the evening every day 10)  Truetrack Test Strp (Glucose blood) .... Use to check blood sugar twice daily 11)  Lancets Misc (Lancets) .... Use to check blood sugar twice daily 12)  Nitrostat 0.4 Mg Subl (Nitroglycerin) .... Take one tablet under your tongue with severe chest pain, repeat in 5 minutes. attend to doctor/hospital if had to take more then 3 in 20 minutes 13)  Diflucan 100 Mg Tabs (Fluconazole) .... Take 1 tablet by mouth two times a day 14)  Lexapro 10 Mg Tabs (Escitalopram oxalate) .... Take 1 tablet by mouth once a day 15)  Hydrocodone-acetaminophen 5-325 Mg Tabs (Hydrocodone-acetaminophen) .... Take 1 tablet by mouth two times a day as needed for pain. 16)  Cephalexin 500 Mg Caps (Cephalexin) .Marland Kitchen.. 1 tab by mouth two times a day x 10 days Prescriptions: CEPHALEXIN 500 MG CAPS (CEPHALEXIN) 1 tab by mouth two times a day x 10 days  #20 x 0   Entered and Authorized by:   Darene Lamer MD   Signed by:   Darene Lamer MD on 07/16/2010   Method used:   Faxed to ...       [facility] Department (retail)       [address] [place], [place]  [address]       Ph: [phone]       Fax: [phone]   RxID:   [id number]

## 2011-01-21 NOTE — Miscellaneous (Signed)
Summary: testing supplies/dmr  Clinical Lists Changes added testing supplies to Medication record Medications: Added new medication of TRUETRACK TEST  STRP (GLUCOSE BLOOD) use to check blood sugar twice daily Added new medication of LANCETS  MISC (LANCETS) use to check blood sugar twice daily

## 2011-01-21 NOTE — Assessment & Plan Note (Signed)
Summary: LEG PAIN/DS/MAGICK   Vital Signs:  Patient profile:   50 year old female Height:      66 inches (167.64 cm) Weight:      245 pounds (111.36 kg) BMI:     39.69 Temp:     98.0 degrees F (36.67 degrees C) oral Pulse rate:   74 / minute BP sitting:   121 / 80  (right arm) Cuff size:   large  Vitals Entered By: Krystal Eaton Duncan Dull) (May 25, 2009 4:40 PM) CC: neuropathy pain Is Patient Diabetic? Yes  Pain Assessment Patient in pain? yes     Location: feet/hands Intensity: 10 Type: burning Onset of pain  Constant Nutritional Status BMI of > 30 = obese  Does patient need assistance? Functional Status Self care Ambulation Impaired:Risk for fall Comments uses a cane   Primary Care Provider:  Mliss Sax MD  CC:  neuropathy pain.  History of Present Illness: Jennifer [name] is a 50 y/o woman with a hx of MDD, DM, HTN and HLPD who presents to the opc on this Friday afternoon, requesting to be seen emergently b/c of bilateral LE pain.  She was given an apptmt for Monday but she wanted to be seen immediately b/c she couldn't wait until Monday. Pt complains that ever since she was taken off of Lyrica, she has been having unbearable pain. She doesn't recall when the medication was d/c'd (it was done in 08/2008). She had been taking amitriptyline (she had refills left) but Dr. Aldine Contes had removed the amitriptyline at a f/u apptmt in 11/2008 (reason noted: other). Pt upset b/c "no one ever told" her that she was supposed to stop the amitriptyline. Has fibromyalgia and neuropathy per her report. Only has one pill of amitriptyline left. Numbness and tingling. Feels like her legs, feet, fingers are on fire, feel swollen. She can hardly put her shoes on. They hurt to touch.  Sugars have been in the 300's.   Preventive Screening-Counseling & Management  Alcohol-Tobacco     Alcohol drinks/day: 0     Smoking Status: current     Smoking Cessation Counseling: yes     Packs/Day: 1.5   Year Started: years     Passive Smoke Exposure: yes  Allergies: 1)  ! Morphine Sulfate (Morphine Sulfate) 2)  ! * Iodinated Contrast  Review of Systems      See HPI  Physical Exam  General:  Overweight, middle aged woman in moderate distress 2/2 pain.  Head:  atraumatic.   Eyes:  vision grossly intact, pupils equal, pupils round, and pupils reactive to light. Anicteric, no injection. Ears:  no external deformities.   Nose:  no external deformity.   Mouth:  Fair dentition, MMM. OP clear. Neck:  supple, full ROM, and no masses.   Lungs:  normal respiratory effort, no intercostal retractions, no accessory muscle use, normal breath sounds, no crackles, and no wheezes.   Heart:  normal rate, regular rhythm, and no murmur.   Abdomen:  soft, non-tender, normal bowel sounds, no distention, no masses, no guarding, and no rigidity.   Msk:  normal ROM, no joint tenderness, no joint swelling, and no joint warmth. Exquisite tenderness to palpation of LE's bilaterally. Pulses:  2+ post. tibial pulses bilaterally. Extremities:  No e/c/c. Neurologic:  alert & oriented X3, cranial nerves II-XII intact, strength normal in all extremities, gait normal, and DTRs symmetrical and normal. Subjective decrease in sensitivity to light touch. Skin:  no rashes and no suspicious lesions.  Psych:  Oriented X3, memory intact for recent and remote, good eye contact, and angry.     Impression & Recommendations:  Problem # 1:  DIABETIC PERIPHERAL NEUROPATHY (ICD-250.60) Patient has continued taking amitriptyline (unknown to Dr. Aldine Contes as far as I can tell) but is about to run out. She wants to be started back on Lyrica which was d/c'd in 08/2008 (reason?). I agreed to restart the Lyrica (starting dose 50 mg by mouth three times a day) but she will return to clinic on Monday for further evaluation. Whether or not she needs to be on amitriptyline can be discussed at that time.  Her updated medication list for this  problem includes:    Lisinopril 20 Mg Tabs (Lisinopril) .Marland Kitchen... Take 1 tablet by mouth once a day    Novolog Mix 70/30 70-30 % Susp (Insulin asp prot & asp (hum)) .Marland Kitchen... 120 units subcutaneously every am    Novolog Mix 70/30 70-30 % Susp (Insulin asp prot & asp (hum)) .Marland KitchenMarland KitchenMarland KitchenMarland Kitchen 65 units subcutaneously every pm  Problem # 2:  DEPRESSION (ICD-311) Pt still on Paxil. Denies SI and HI.  Her updated medication list for this problem includes:    Paxil 20 Mg Tabs (Paroxetine hcl) .Marland Kitchen... Take 1 tablet by mouth once a day  Her updated medication list for this problem includes:    Paxil 20 Mg Tabs (Paroxetine hcl) .Marland Kitchen... Take 1 tablet by mouth once a day  Problem # 3:  HYPERTENSION, BENIGN ESSENTIAL (ICD-401.1) At goal. Continue current regimen.  Her updated medication list for this problem includes:    Atenolol 100 Mg Tabs (Atenolol) .Marland Kitchen... Take 1 tablet by mouth once a day    Lisinopril 20 Mg Tabs (Lisinopril) .Marland Kitchen... Take 1 tablet by mouth once a day    Hydrochlorothiazide 25 Mg Tabs (Hydrochlorothiazide) .Marland Kitchen... Take 1 tablet by mouth once a day  BP today: 121/80 Prior BP: 123/81 (03/28/2009)  Labs Reviewed: K+: 4.3 (01/29/2009) Creat: : 0.84 (01/29/2009)   Chol: 171 (11/24/2008)   HDL: 30 (11/24/2008)   LDL: 79 (11/24/2008)   TG: 310 (11/24/2008)  Problem # 4:  DM W/MANIFESTATION NEC, TYPE II, UNCONTROLLED (ICD-250.82) Poorly controlled. Pt needs f/u visit to address her diabetes control. Controlling her glycemia will help with her neuropathic pain.  Her updated medication list for this problem includes:    Lisinopril 20 Mg Tabs (Lisinopril) .Marland Kitchen... Take 1 tablet by mouth once a day    Novolog Mix 70/30 70-30 % Susp (Insulin asp prot & asp (hum)) .Marland Kitchen... 120 units subcutaneously every am    Novolog Mix 70/30 70-30 % Susp (Insulin asp prot & asp (hum)) .Marland KitchenMarland KitchenMarland KitchenMarland Kitchen 65 units subcutaneously every pm  Labs Reviewed: Creat: 0.84 (01/29/2009)     Last Eye Exam: No diabetic retinopathy.    (05/31/2008) Reviewed  HgBA1c results: 12.1 (01/29/2009)  9.4 (09/26/2008)  Complete Medication List: 1)  Atenolol 100 Mg Tabs (Atenolol) .... Take 1 tablet by mouth once a day 2)  Lisinopril 20 Mg Tabs (Lisinopril) .... Take 1 tablet by mouth once a day 3)  Hydrochlorothiazide 25 Mg Tabs (Hydrochlorothiazide) .... Take 1 tablet by mouth once a day 4)  Advair Diskus 250-50 Mcg/dose Misc (Fluticasone-salmeterol) .... One puff two times a day 5)  Novolog Mix 70/30 70-30 % Susp (Insulin asp prot & asp (hum)) .Marland Kitchen.. 120 units subcutaneously every am 6)  Novolog Mix 70/30 70-30 % Susp (Insulin asp prot & asp (hum)) .... 65 units subcutaneously every pm 7)  Tylenol Extra Strength 500  Mg Tabs (Acetaminophen) .... Take 1 tablet by mouth every 6 hours as needed for pain 8)  Pravachol 20 Mg Tabs (Pravastatin sodium) .... Take 1 tab by mouth at bedtime 9)  Plavix 75 Mg Tabs (Clopidogrel bisulfate) .... Take 1 tablet by mouth once a day 10)  Paxil 20 Mg Tabs (Paroxetine hcl) .... Take 1 tablet by mouth once a day 11)  Ra Pen Needles 31g X 8 Mm Misc (Insulin pen needle) .... Use 3 times per day to check the sugar level 12)  Lyrica 50 Mg Caps (Pregabalin) .... Take 1 capsule by mouth three times a day  Other Orders: Vascular Clinic (Vascular)  Patient Instructions: 1)  Restart Lyrica 50 mg by mouth three times a day.  2)  Keep your appointment on Monday. 3)  We will refer you back to your vein specialist. Prescriptions: PAXIL 20 MG TABS (PAROXETINE HCL) Take 1 tablet by mouth once a day  #30 x 2   Entered and Authorized by:   Olene Craven MD   Signed by:   Olene Craven MD on 05/25/2009   Method used:   Print then Give to Patient   RxID:   [id number] LYRICA 50 MG CAPS (PREGABALIN) Take 1 capsule by mouth three times a day  #21 x 0   Entered and Authorized by:   Olene Craven MD   Signed by:   Olene Craven MD on 05/25/2009   Method used:   Print then Give to Patient   RxID:   [id number]

## 2011-01-21 NOTE — Assessment & Plan Note (Signed)
Summary: F/U FOOT PER Anayelli Lai,MC   Vital Signs:  Patient profile:   50 year old female Height:      66.6 inches BP sitting:   146 / 80  (right arm) Cuff size:   regular  Vitals Entered By: Tessie Fass CMA (July 18, 2010 8:55 AM) CC: F/U right foot   Primary Provider:  Mliss Sax MD  CC:  F/U right foot.  History of Present Illness: 50yo female w/ PMH diabetes to office for f/u of R 3rd toe ulceration & R 2nd toe fx.  Evaluated 2-days ago in office, instructed on dressing changes, use of post-op shoe, & rx for keflex given.  Pt has been keep area clean/dry, does not note any purulent drainage.  Denies any increased redness, swelling, pain.  Denies fevers/chills.  Has not gotten antibiotic rx at this time b/c waiting to get paycheck.  She has been walking with post-op shoe without much difficulty.   Allergies: 1)  ! Morphine Sulfate (Morphine Sulfate) 2)  ! * Iodinated Contrast  Review of Systems      See HPI General:  alert, appropriate dress, and normal appearance.   Msk:  FOOT: R foot with mild TTP over MTP joint of 2nd toe - is improved from last evaluation.  No ecchymosis, minimal swelling.  Normal ROM of all toes.  Ulceration on plantar aspect of R 3rd toe appears to be healing.  Gauze with some dried blood when removed, no purulent drainage, no surrounding erythema.  Wound edges are macerated.  No signs of infection at this time.   Pulses:  +1/4 dorsalis pedis & posterior tib b/l Extremities:  trace edema LE b/l Neurologic:  sensation intact to light touch  Impression & Recommendations:  Problem # 1:  FOOT ULCER, RIGHT (ICD-707.10) - Appears to show signs of healing. - Dressing changed in office today.  Should change dressing daily & apply bacitracin with dressing changes.  Try to keep area clean & dry.  Should have family member or friend look at area daily to ensure no signs of infection (which would include increased redness, increase swelling, purulent drainage) -  Cont. to wear post-op shoe, should try to limit walking to allow for ample healing. - Emphasized need to get & take antibiotic.  Explained since she is diabetic she is at inreased risk for poor wound healing & infection.  Should start antibiotic ASAP, although no signs of infection noted today. - Can follow-up in 1-week for re-evaluation, then can likely transition care to PCP for further f/u.  Problem # 2:  CLOSED FRACTURE OF ONE OR MORE PHALANGES OF FOOT (ICD-826.0) - cont to wear post-op shoe  Complete Medication List: 1)  Atenolol 100 Mg Tabs (Atenolol) .... Take 1 tablet by mouth once a day 2)  Lisinopril 20 Mg Tabs (Lisinopril) .... Take 1 tablet by mouth once a day 3)  Hydrochlorothiazide 25 Mg Tabs (Hydrochlorothiazide) .... Take 1 tablet by mouth once a day 4)  Advair Diskus 250-50 Mcg/dose Misc (Fluticasone-salmeterol) .... One puff two times a day 5)  Plavix 75 Mg Tabs (Clopidogrel bisulfate) .... Take 1 tablet by mouth once a day 6)  Ra Pen Needles 31g X 8 Mm Misc (Insulin pen needle) .... Use 3 times per day to check the sugar level 7)  Lyrica 100 Mg Caps (Pregabalin) .... Take 1 tablet by mouth three times a day 8)  Tramadol Hcl 50 Mg Tabs (Tramadol hcl) .... One tab up to four times a day  for pain 9)  Humalog Mix 75/25 Pen 75-25 % Susp (Insulin lispro prot & lispro) .... Inject under the skin 110 units in the am and 55 units in the evening every day 10)  Truetrack Test Strp (Glucose blood) .... Use to check blood sugar twice daily 11)  Lancets Misc (Lancets) .... Use to check blood sugar twice daily 12)  Nitrostat 0.4 Mg Subl (Nitroglycerin) .... Take one tablet under your tongue with severe chest pain, repeat in 5 minutes. attend to doctor/hospital if had to take more then 3 in 20 minutes 13)  Diflucan 100 Mg Tabs (Fluconazole) .... Take 1 tablet by mouth two times a day 14)  Lexapro 10 Mg Tabs (Escitalopram oxalate) .... Take 1 tablet by mouth once a day 15)   Hydrocodone-acetaminophen 5-325 Mg Tabs (Hydrocodone-acetaminophen) .... Take 1 tablet by mouth two times a day as needed for pain. 16)  Cephalexin 500 Mg Caps (Cephalexin) .Marland Kitchen.. 1 tab by mouth two times a day x 10 days

## 2011-01-21 NOTE — Assessment & Plan Note (Signed)
Summary: FU/EST/VS   Vital Signs:  Patient Profile:   50 Years Old Female Height:     66 inches (167.64 cm) Weight:      259.2 pounds (117.82 kg) BMI:     41.99 BP sitting:   170 / 104  (left arm) Cuff size:   large  Pt. in pain?   yes    Location:   back    Intensity:   8    Type:       ALL  Vitals Entered By: Theotis Barrio NT II (May 09, 2008 10:52 AM)              Is Patient Diabetic? Yes  Nutritional Status BMI of > 30 = obese  Have you ever been in a relationship where you felt threatened, hurt or afraid?No   Does patient need assistance? Functional Status Self care Ambulation Normal     PCP:  Peggye Pitt MD  Chief Complaint:  BACKPAIN / MEDICATION REFILL / .  History of Present Illness: Jennifer [name] is here for followup today. Unfortunately since I last saw her she has lost her job and now cannot afford any of her medications. [name] has been helping her with the costly meds like Lyrica, insulin and Plavix, but she is unable to assist her with the meds that would cost $4. I have asked [name] to assist her today with the [facility] and she will give her $40 dollars today, which should be a 3 month supply of her BP and cholesterol meds. She has applied for disability and is still waiting for a response. Her only complaint today is continued back pain. 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 The pain clinic has said they can no longer help. She has 3 more sessions of PT left, but feels that is not helping either. She has been on NSAIDS and is reluctant to start on a pain contract for chronic narcotics. She is now walking with a cane. I have told her that we are pretty much out of options for her back pain, and the only option left is to treat her with narcotics. She would like to think about this. She has been seeing Dr.NIchols at the wound center for her diabetic ulcer, which is now almost healed. She is wearing orthopedic boots in both  feet. She developed another non-healing laceration over her left calf and Dr. Tanda Rockers wrapped it up in an unnaboot (I have not unwrapped it to look at it today).    Prior Medication List:  ATENOLOL 100 MG TABS (ATENOLOL) Take 1 tablet by mouth once a day LISINOPRIL 20 MG TABS (LISINOPRIL) Take 1 tablet by mouth once a day HYDROCHLOROTHIAZIDE 25 MG TABS (HYDROCHLOROTHIAZIDE) Take 1 tablet by mouth once a day LYRICA 100 MG CAPS (PREGABALIN) Take 1 tablet by mouth three times a day ADVAIR DISKUS 250-50 MCG/DOSE MISC (FLUTICASONE-SALMETEROL) One puff two times a day ALBUTEROL 90 MCG/ACT AERS (ALBUTEROL) one puff every 6 hrs as needed for wheezing NOVOLOG MIX 70/30 70-30 % SUSP (INSULIN ASP PROT & ASP (HUM)) 120 units Subcutaneously every am NOVOLOG MIX 70/30 70-30 % SUSP (INSULIN ASP PROT & ASP (HUM)) 65 units subcutaneously every pm DARVOCET-N 100 100-650 MG  TABS (PROPOXYPHENE N-APAP) Take 1 -2 pills every 4 hours as needed for back pain. TYLENOL EXTRA STRENGTH 500 MG TABS (ACETAMINOPHEN) Take 1 tablet by mouth every 6 hours as needed for pain PRAVACHOL 20 MG  TABS (PRAVASTATIN SODIUM)  Take 1 tab by mouth at bedtime PLAVIX 75 MG  TABS (CLOPIDOGREL BISULFATE) Take 1 tablet by mouth once a day LEXAPRO 10 MG  TABS (ESCITALOPRAM OXALATE) Take 1 tablet by mouth once a day   Current Allergies: ! MORPHINE SULFATE (MORPHINE SULFATE) ! * IODINATED CONTRAST    Risk Factors: Tobacco use:  current    Year started:  years Alcohol use:  no Exercise:  no Seatbelt use:  100 %  Mammogram History:    Date of Last Mammogram:  04/11/2008  PAP Smear History:    Date of Last PAP Smear:  08/17/2001   Review of Systems       The patient complains of weight gain and difficulty walking.  The patient denies fever, weight loss, chest pain, dyspnea on exhertion, hemoptysis, abdominal pain, melena, hematochezia, and severe indigestion/heartburn.     Physical Exam  General:      Well-developed,well-nourished,in no acute distress; alert,appropriate and cooperative throughout examination Eyes:     pupils equal, pupils round, and pupils reactive to light.   Ears:     R ear normal and L ear normal.   Mouth:     no gingival abnormalities and pharynx pink and moist.   Neck:     No bruits/goiter Lungs:     Normal respiratory effort, chest expands symmetrically. Lungs are clear to auscultation, no crackles or wheezes. Heart:     Normal rate and regular rhythm. S1 and S2 normal without gallop, murmur, click, rub or other extra sounds. Abdomen:     Bowel sounds positive,abdomen soft and non-tender without masses, organomegaly or hernias noted. Extremities:     No clubbing, cyanosis, edemaon right. Left wrapped in unnaboot. Psych:     Oriented X3 and good eye contact.      Impression & Recommendations:  Problem # 1:  DIABETIC FOOT ULCER, RIGHT (ICD-250.80) Per Dr.Nichols at the wound center.  Her updated medication list for this problem includes:    Lisinopril 20 Mg Tabs (Lisinopril) .Marland Kitchen... Take 1 tablet by mouth once a day    Novolog Mix 70/30 70-30 % Susp (Insulin asp prot & asp (hum)) .Marland Kitchen... 120 units subcutaneously every am    Novolog Mix 70/30 70-30 % Susp (Insulin asp prot & asp (hum)) .Marland KitchenMarland KitchenMarland KitchenMarland Kitchen 65 units subcutaneously every pm   Problem # 2:  BACK PAIN (ICD-724.5) See HPI for details. Our only option at this point is to start a pain contract. She will think about this and let me know at our next visit.    Problem # 3:  TOBACCO ABUSE (ICD-305.1) Still smoking. Does not wish to quit at this time.  Problem # 4:  DM W/MANIFESTATION NEC, TYPE II, UNCONTROLLED (ICD-250.82) Have not made any changes today. Most time was spent on counselling and discussing her finances and back pain. She continues to take her insulin. A1C due at next visit in 1 month. Needs an eye exam...Marland KitchenMarland KitchenMarland Kitchenwith limited finances, will need to readdress next visit.  Her updated medication list for  this problem includes:    Lisinopril 20 Mg Tabs (Lisinopril) .Marland Kitchen... Take 1 tablet by mouth once a day    Novolog Mix 70/30 70-30 % Susp (Insulin asp prot & asp (hum)) .Marland Kitchen... 120 units subcutaneously every am    Novolog Mix 70/30 70-30 % Susp (Insulin asp prot & asp (hum)) .Marland KitchenMarland KitchenMarland KitchenMarland Kitchen 65 units subcutaneously every pm  Labs Reviewed: HgBA1c: 9.5 (03/01/2008)   Creat: 0.73 (03/01/2008)      Problem # 5:  HYPERTENSION,  BENIGN ESSENTIAL (ICD-401.1) BP very elevated. Has been out of meds 2/2 finances. See HPI for details. Will return in 1 month. At that time will recheck BP after she has been on meds for 1 month..Also needs a BMET at that time.  Her updated medication list for this problem includes:    Atenolol 100 Mg Tabs (Atenolol) .Marland Kitchen... Take 1 tablet by mouth once a day    Lisinopril 20 Mg Tabs (Lisinopril) .Marland Kitchen... Take 1 tablet by mouth once a day    Hydrochlorothiazide 25 Mg Tabs (Hydrochlorothiazide) .Marland Kitchen... Take 1 tablet by mouth once a day  BP today: 170/104 Prior BP: 130/80 (04/06/2008)  Labs Reviewed: Creat: 0.73 (03/01/2008) Chol: 165 (09/20/2007)   HDL: 45 (09/20/2007)   LDL: 110 (09/20/2007)   TG: 52 (09/20/2007)   Problem # 6:  HEALTH SCREENING (ICD-V70.0)  Mammogram scheduled today. Has had a complete hysterectomy. Almost at age for colon cancer screening.  Orders: Mammogram (Screening) (Mammo)   Complete Medication List: 1)  Atenolol 100 Mg Tabs (Atenolol) .... Take 1 tablet by mouth once a day 2)  Lisinopril 20 Mg Tabs (Lisinopril) .... Take 1 tablet by mouth once a day 3)  Hydrochlorothiazide 25 Mg Tabs (Hydrochlorothiazide) .... Take 1 tablet by mouth once a day 4)  Lyrica 100 Mg Caps (Pregabalin) .... Take 1 tablet by mouth three times a day 5)  Advair Diskus 250-50 Mcg/dose Misc (Fluticasone-salmeterol) .... One puff two times a day 6)  Albuterol 90 Mcg/act Aers (Albuterol) .... One puff every 6 hrs as needed for wheezing 7)  Novolog Mix 70/30 70-30 % Susp (Insulin asp  prot & asp (hum)) .Marland Kitchen.. 120 units subcutaneously every am 8)  Novolog Mix 70/30 70-30 % Susp (Insulin asp prot & asp (hum)) .... 65 units subcutaneously every pm 9)  Darvocet-n 100 100-650 Mg Tabs (Propoxyphene n-apap) .... Take 1 -2 pills every 4 hours as needed for back pain. 10)  Tylenol Extra Strength 500 Mg Tabs (Acetaminophen) .... Take 1 tablet by mouth every 6 hours as needed for pain 11)  Pravachol 20 Mg Tabs (Pravastatin sodium) .... Take 1 tab by mouth at bedtime 12)  Plavix 75 Mg Tabs (Clopidogrel bisulfate) .... Take 1 tablet by mouth once a day 13)  Lexapro 10 Mg Tabs (Escitalopram oxalate) .... Take 1 tablet by mouth once a day   Patient Instructions: 1)  Please schedule a follow-up appointment in 1 month. 2)  Stop to see [name] on your way out. She will help you with medications.   ]

## 2011-01-21 NOTE — Assessment & Plan Note (Signed)
Summary: FU/EST/VS   Vital Signs:  Patient Profile:   50 Years Old Female Height:     66 inches (167.64 cm) Weight:      252.2 pounds BMI:     40.85 Temp:     97.3 degrees F oral Pulse rate:   60 / minute BP sitting:   110 / 70  (right arm)  Pt. in pain?   no    Location:   back  Vitals Entered By: Filomena Jungling (March 30, 2008 9:30 AM)              Is Patient Diabetic? Yes  Nutritional Status BMI of > 30 = obese  Does patient need assistance? Functional Status Self care Ambulation Normal     PCP:  Peggye Pitt MD  Chief Complaint:  CHECK UP- SKIN PEELING.  History of Present Illness: Jennifer [name] has multiple medical problems, but most of all, uncontrolled DM with severe peripheral neuropathy and PVD s/p bilateral iliac stent placements. She is coming in today with a 3 week h/o of an ulcer on the sole of her right foot. She has been febrile up to 102 at home as well, also with general malaise and nausea. Has not had any recent URIs. She has no medical insurance.    Prior Medication List:  ATENOLOL 100 MG TABS (ATENOLOL) Take 1 tablet by mouth once a day LISINOPRIL 20 MG TABS (LISINOPRIL) Take 1 tablet by mouth once a day HYDROCHLOROTHIAZIDE 25 MG TABS (HYDROCHLOROTHIAZIDE) Take 1 tablet by mouth once a day LYRICA 100 MG CAPS (PREGABALIN) Take 1 tablet by mouth three times a day ADVAIR DISKUS 250-50 MCG/DOSE MISC (FLUTICASONE-SALMETEROL) One puff two times a day ALBUTEROL 90 MCG/ACT AERS (ALBUTEROL) one puff every 6 hrs as needed for wheezing NOVOLOG MIX 70/30 70-30 % SUSP (INSULIN ASP PROT & ASP (HUM)) 110 units Subcutaneously every am NOVOLOG MIX 70/30 70-30 % SUSP (INSULIN ASP PROT & ASP (HUM)) 60 units subcutaneously every pm DARVOCET-N 100 100-650 MG  TABS (PROPOXYPHENE N-APAP) Take 1 -2 pills every 4 hours as needed for back pain. TYLENOL EXTRA STRENGTH 500 MG TABS (ACETAMINOPHEN) Take 1 tablet by mouth every 6 hours as needed for pain PRAVACHOL 20 MG   TABS (PRAVASTATIN SODIUM) Take 1 tab by mouth at bedtime PLAVIX 75 MG  TABS (CLOPIDOGREL BISULFATE) Take 1 tablet by mouth once a day   Current Allergies: ! MORPHINE SULFATE (MORPHINE SULFATE) ! * IODINATED CONTRAST    Risk Factors: Tobacco use:  current    Year started:  years Alcohol use:  no Exercise:  no Seatbelt use:  100 %  Mammogram History:    Date of Last Mammogram:  07/26/2004  PAP Smear History:    Date of Last PAP Smear:  08/17/2001   Review of Systems       The patient complains of fever.  The patient denies weight loss, chest pain, dyspnea on exhertion, abdominal pain, melena, hematochezia, and severe indigestion/heartburn.     Physical Exam  General:     Appears depressed. Neck:     no bruits/goiter Lungs:     Normal respiratory effort, chest expands symmetrically. Lungs are clear to auscultation, no crackles or wheezes. Heart:     Normal rate and regular rhythm. S1 and S2 normal without gallop, murmur, click, rub or other extra sounds. Extremities:     No clubbing, cyanosis, edema On sole of right foot: 3x3 cm ulcer...appears clean based and without purulence.  Impression & Recommendations:  Problem # 1:  DIABETIC FOOT ULCER, RIGHT (ICD-250.80) Because of her lack of medical insurance, I have decided, with Dr. Daiva Eves, that the most prudent thing to do would be to admit her to the hospital for antibiotics and surgical evaluation. I do not think she has osteomyelitis becasue the wound does not appear deep enough. If she had medical insurance I would consider not admitting her and having her go to the wound clinic. Will order CBC with diff, CMET and blood cultures. B-service resident has been notified of admission.  Her updated medication list for this problem includes:    Lisinopril 20 Mg Tabs (Lisinopril) .Marland Kitchen... Take 1 tablet by mouth once a day    Novolog Mix 70/30 70-30 % Susp (Insulin asp prot & asp (hum)) .Marland Kitchen... 110 units subcutaneously every  am    Novolog Mix 70/30 70-30 % Susp (Insulin asp prot & asp (hum)) .Marland KitchenMarland KitchenMarland KitchenMarland Kitchen 60 units subcutaneously every pm  Orders: T-Comprehensive Metabolic Panel ([id number]) T-CBC w/Diff ([id number]) T-Blood Culture ([id number])   Complete Medication List: 1)  Atenolol 100 Mg Tabs (Atenolol) .... Take 1 tablet by mouth once a day 2)  Lisinopril 20 Mg Tabs (Lisinopril) .... Take 1 tablet by mouth once a day 3)  Hydrochlorothiazide 25 Mg Tabs (Hydrochlorothiazide) .... Take 1 tablet by mouth once a day 4)  Lyrica 100 Mg Caps (Pregabalin) .... Take 1 tablet by mouth three times a day 5)  Advair Diskus 250-50 Mcg/dose Misc (Fluticasone-salmeterol) .... One puff two times a day 6)  Albuterol 90 Mcg/act Aers (Albuterol) .... One puff every 6 hrs as needed for wheezing 7)  Novolog Mix 70/30 70-30 % Susp (Insulin asp prot & asp (hum)) .Marland Kitchen.. 110 units subcutaneously every am 8)  Novolog Mix 70/30 70-30 % Susp (Insulin asp prot & asp (hum)) .... 60 units subcutaneously every pm 9)  Darvocet-n 100 100-650 Mg Tabs (Propoxyphene n-apap) .... Take 1 -2 pills every 4 hours as needed for back pain. 10)  Tylenol Extra Strength 500 Mg Tabs (Acetaminophen) .... Take 1 tablet by mouth every 6 hours as needed for pain 11)  Pravachol 20 Mg Tabs (Pravastatin sodium) .... Take 1 tab by mouth at bedtime 12)  Plavix 75 Mg Tabs (Clopidogrel bisulfate) .... Take 1 tablet by mouth once a day     ]  Vital Signs:  Patient Profile:   50 Years Old Female Height:     66 inches (167.64 cm) Weight:      252.2 pounds BMI:     40.85 Temp:     97.3 degrees F oral Pulse rate:   60 / minute BP sitting:   110 / 70    Location:   back             Last PAP Result Normal PapHx  Normal (08/17/2001 6:26:06 PM)     Appended Document: Orders Update    Clinical Lists Changes  Orders: Added new Test order of T-Culture, Wound [phone]) - Signed Added new Test order of T-Sed Rate (Automated) [phone]) -  Signed

## 2011-01-23 ENCOUNTER — Other Ambulatory Visit: Payer: Self-pay | Admitting: *Deleted

## 2011-01-23 MED ORDER — PREGABALIN 100 MG PO CAPS: 100.0000 mg | ORAL_CAPSULE | Freq: Three times a day (TID) | ORAL | Status: DC

## 2011-01-23 MED ORDER — FLUTICASONE-SALMETEROL 250-50 MCG/DOSE IN AEPB: 1.0000 | INHALATION_SPRAY | Freq: Two times a day (BID) | RESPIRATORY_TRACT | Status: DC

## 2011-01-23 MED ORDER — CLOPIDOGREL BISULFATE 75 MG PO TABS: 75.0000 mg | ORAL_TABLET | Freq: Every day | ORAL | Status: DC

## 2011-01-23 NOTE — Assessment & Plan Note (Signed)
Summary: SCREEN FOR COLON-ON PLAVIX/YF   History of Present Illness Visit Type: Initial Consult Primary GI MD: Melvia Heaps MD [facility] Primary Provider: Mliss Sax, MD  Requesting Provider: Ileana Roup, MD  Chief Complaint: GERD, and dysphagia with liquids off and on but fine with swallowing solids   History of Present Illness:   Jennifer [name] is a 50 year old Afro-American female referred at the request of Dr. Meredith Pel for evaluation of recurrent rectal abscesses, pyrosis and dysphagia.  She has been treated for recurrent perirectal abscesses.  She has insulin-dependent diabetes mellitus.  She moves her bowels regularly and denies rectal bleeding.  She complains of frequent pyrosis despite taking over-the-counter Prilosec and complains of dysphagia to liquids on occasion.  She has no difficulty swallowing solids.  In addition to diabetes, the patient has peripheral vascular disease and is on Plavix since placement of a PTCA/stent of her left iliac artery.   GI Review of Systems    Reports acid reflux, dysphagia with liquids, and  heartburn.      Denies abdominal pain, belching, bloating, chest pain, dysphagia with solids, loss of appetite, nausea, vomiting, vomiting blood, weight loss, and  weight gain.        Denies anal fissure, black tarry stools, change in bowel habit, constipation, diarrhea, diverticulosis, fecal incontinence, heme positive stool, hemorrhoids, irritable bowel syndrome, jaundice, light color stool, liver problems, rectal bleeding, and  rectal pain.    Current Medications (verified): 1)  Atenolol 100 Mg Tabs (Atenolol) .... Take 1 Tablet By Mouth Once A Day 2)  Lisinopril 20 Mg Tabs (Lisinopril) .... Take 1 Tablet By Mouth Once A Day 3)  Hydrochlorothiazide 25 Mg Tabs (Hydrochlorothiazide) .... Take 1 Tablet By Mouth Once A Day 4)  Advair Diskus 250-50 Mcg/dose Misc (Fluticasone-Salmeterol) .... One Puff Two Times A Day 5)  Plavix 75 Mg  Tabs (Clopidogrel Bisulfate)  .... Take 1 Tablet By Mouth Once A Day 6)  Ra Pen Needles 31g X 8 Mm Misc (Insulin Pen Needle) .... Use 3 Times Per Day To Check The Sugar Level 7)  Lyrica 100 Mg Caps (Pregabalin) .... Take 1 Tablet By Mouth Three Times A Day 8)  Humalog Mix 75/25 Pen 75-25 % Susp (Insulin Lispro Prot & Lispro) .... Inject Under The Skin 110 Units in The Am and 55 Units in The Evening Every Day 9)  Truetrack Test  Strp (Glucose Blood) .... Use To Check Blood Sugar Twice Daily 10)  Lancets  Misc (Lancets) .... Use To Check Blood Sugar Twice Daily 11)  Nitrostat 0.4 Mg Subl (Nitroglycerin) .... Take One Tablet Under Your Tongue With Severe Chest Pain, Repeat in 5 Minutes. Attend To Doctor/hospital If Had To Take More Then 3 in 20 Minutes 12)  Diflucan 100 Mg Tabs (Fluconazole) .... Take 1 Tablet By Mouth Two Times A Day 13)  Lexapro 10 Mg Tabs (Escitalopram Oxalate) .... Take 1 Tablet By Mouth Once A Day 14)  Hydrocodone-Acetaminophen 5-325 Mg Tabs (Hydrocodone-Acetaminophen) .... Take 1 Tablet By Mouth Two Times A Day As Needed For Pain. 15)  Zoloft 25 Mg Tabs (Sertraline Hcl) .... Take 1 Tablet By Mouth Once A Day 16)  Trazodone Hcl 100 Mg Tabs (Trazodone Hcl) .... Take 1/2 Tablet At Bedtime As Needed For Insomnia 17)  Tramadol Hcl 50 Mg Tabs (Tramadol Hcl) .... Take 1 Tablet 2-3 Times Per Day As Needed For Pain 18)  Aspirin 81 Mg  Tabs (Aspirin) .... One Tablet By Mouth Once Daily 19)  Prilosec Otc  20 Mg Tbec (Omeprazole Magnesium) .... As Needed 20)  Tums 500 Mg Chew (Calcium Carbonate Antacid) .... As Needed  Allergies (verified): 1)  ! Morphine Sulfate (Morphine Sulfate) 2)  ! 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 PTCA/stent, left iliac 2005 R shoulder arthroscropy/debridement Hysterectomy, 1997 due to fibroids Wrist surgery Breast surgery  Family History: Mother  DM2, CHF, living Father asthma, living Brothers and sisters asthma since childhood Younger Sister MI at 26 yrs old, now doing better  Family History of Breast Cancer:Paternal Aunt Family History of Prostate Cancer:Paternal Uncle and Paternal Cousin  No FH of Colon Cancer:  Social History: Lives with daughter-in-law in apartment, currently unemployed-disabled  Divorced  2 childern  Tobacco Use - Yes.  Alcohol Use - no Daily Caffeine Use: 3-4 daily  Illicit Drug Use - no  Review of Systems       The patient complains of allergy/sinus, anxiety-new, arthritis/joint pain, back pain, change in vision, cough, depression-new, fatigue, hearing problems, itching, shortness of breath, sleeping problems, sore throat, swelling of feet/legs, swollen lymph glands, thirst - excessive, urination - excessive, and urine leakage.  The patient denies anemia, blood in urine, breast changes/lumps, confusion, coughing up blood, fainting, fever, headaches-new, heart murmur, heart rhythm changes, menstrual pain, muscle pains/cramps, night sweats, nosebleeds, pregnancy symptoms, skin rash, thirst - excessive , urination - excessive , urination changes/pain, vision changes, and voice change.          All other systems were reviewed and were negative   Vital Signs:  Patient profile:   50 year old female Height:      66.6 inches Weight:      249 pounds BMI:     39.61 BSA:     2.21 Pulse rate:   100 / minute Pulse rhythm:   regular BP sitting:   142 / 86  (left arm) Cuff size:   regular  Vitals Entered By: Ok Anis CMA (December 26, 2010 1:45 PM)  Physical Exam  Additional Exam:  On physical exam she is an obese female  skin: anicteric HEENT: normocephalic; PEERLA; no nasal or pharyngeal abnormalities neck: supple nodes: no cervical lymphadenopathy chest: clear to ausculatation and percussion heart: no murmurs, gallops, or rubs abd: soft, nontender; BS normoactive; no abdominal masses, tenderness, organomegaly rectal: deferred ext: no cynanosis, clubbing, edema skeletal: no deformities neuro: oriented x 3; no focal abnormalities    Impression & Recommendations:  Problem # 1:  GASTROESOPHAGEAL REFLUX DISEASE (ICD-530.81) Patient is symptomatic despite taking Prilosec.  In addition, she complains of dysphagia which raises the question of an esophageal stricture.  Recommendations #1 trial of AcipHex 20 mg daily #2 upper endoscopy with dilatation as indicated.  Plavix will be held in anticipation of the procedure.  Risks, alternatives, and complications of the procedure, including bleeding, perforation, and possible need for surgery, were explained to the patient.  Patient's questions were answered.  Problem #  2:  BOILS, RECURRENT (ICD-680.9) Recurrent perirectal abscesses raises the question of inflammatory bowel disease.  Certainly, she may be immunocompromised because of her diabetes.  Recommendations #1 colonoscopy both for colorectal cancer screening and to rule out inflammatory bowel disease  The procedures will be done with propofol sedation because of the patient's poorly controlled diabetes (Hg A1C >8) and her use of pain medicines and antianxiety  medicines.  Problem # 3:  DIAB W/NEURO MANIFESTS TYPE II/UNS NOT UNCNTRL (ICD-250.60) Assessment: Comment Only  Problem # 4:  GLAUCOMA (ICD-365.9) Assessment: Comment Only  Problem # 5:  CLAUDICATION (ICD-443.9)  Other Orders: Colon/Endo (Colon/Endo)  Patient Instructions: 1)  Copy sent to : Mliss Sax, MD ,Ileana Roup, MD  2)  Your Colonoscopy/EGD with Propofol is scheduled on 01/15/2011 at 9am 3)  You can pick up your MoviPrep from your pharmacy today 4)  The medication list was reviewed and reconciled.  All changed / newly prescribed medications were explained.  A complete medication list was provided to the patient / caregiver. Prescriptions: MOVIPREP 100 GM  SOLR (PEG-KCL-NACL-NASULF-NA ASC-C) As per prep instructions.  #1 x 0   Entered by:   Merri Ray CMA (AAMA)   Authorized by:   Louis Meckel MD   Signed by:   Merri Ray CMA (AAMA) on 12/26/2010   Method used:   Electron[facility] to        [facility]  [address] [id number]       [address]       [place], [place]  [address]       Ph: [phone] or [phone]       Fax: [phone]   RxID:   [id number]

## 2011-01-23 NOTE — Procedures (Addendum)
Summary: Colonoscopy  Patient: Jennifer [name] Note: All result statuses are Final unless otherwise noted.  Tests: (1) Colonoscopy (COL)   COL Colonoscopy           DONE (C)     [facility]     [address]     [place], [place]  [address]          COLONOSCOPY PROCEDURE REPORT          PATIENT:  Jennifer, [name]  MR#:  [id number]     BIRTHDATE:  29-Mar-1961, 49 yrs. old  GENDER:  female          ENDOSCOPIST:  Barbette Hair. Arlyce Dice, MD     Referred by:  Marin Roberts, M.D.          PROCEDURE DATE:  01/15/2011     PROCEDURE:  Colonoscopy with snare polypectomy     ASA CLASS:  Class II     INDICATIONS:  1) blank Recurrent rectal abcesses; r/o IBD          MEDICATIONS:   MAC sedation, administered by CRNA Propofol 310mg      IV(correction), glycopyrrolate (Robinal) 0.2 mg IV, 0.6cc     simethancone 0.6 cc PO          DESCRIPTION OF PROCEDURE:   After the risks benefits and     alternatives of the procedure were thoroughly explained, informed     consent was obtained.  Digital rectal exam was performed and     revealed no abnormalities.   The [id number] endoscope was     introduced through the anus and advanced to the cecum, which was     identified by the ileocecal valve, without limitations.  The     quality of the prep was good, using MoviPrep.  The instrument was     then slowly withdrawn as the colon was fully examined.     <<PROCEDUREIMAGES>>          FINDINGS:  Diverticula were found in the ascending colon. Few     diverticula  Mild diverticulosis was found in the sigmoid colon     (see image12).  There were multiple polyps identified and removed.     in the sigmoid colon. At least 3 hyperplastic appearing polyps     3-68mm at 18-20cm from anus (see image15). 2 polyps were removed     with cold polypectomy snare and submitted to pathology  This was     otherwise a normal examination of the colon (see image2, image3,     image4, image6, image7, image10,  image11, and image16).     Retroflexed views in the rectum revealed no abnormalities.    The     time to cecum =  2.50  minutes. The scope was then withdrawn (time     =  12.25  min) from the patient and the procedure completed.          COMPLICATIONS:  None          ENDOSCOPIC IMPRESSION:     1) Diverticula in the ascending colon     2) Mild diverticulosis in the sigmoid colon     3) Polyps, multiple in the sigmoid colon     4) Otherwise normal examination     RECOMMENDATIONS:     1) If the polyp(s) removed today are proven to be adenomatous     (pre-cancerous) polyps, you will need a repeat colonoscopy in 5  years. Otherwise you should continue to follow colorectal cancer     screening guidelines for "routine risk" patients with colonoscopy     in 10 years.          REPEAT EXAM:   You will receive a letter from Dr. Arlyce Dice in 1-2     weeks, after reviewing the final pathology, with followup     recommendations.          ______________________________     Barbette Hair Arlyce Dice, MD          CC:          n.     REVISED:  03/10/2011 11:27 AM     eSIGNED:   Barbette Hair. Talaysha Freeberg at 03/10/2011 11:27 AM          Jacques Navy, [id number]  Note: An exclamation mark (!) indicates a result that was not dispersed into the flowsheet. Document Creation Date: 03/10/2011 11:27 AM _______________________________________________________________________  (1) Order result status: Final Collection or observation date-time: 01/15/2011 09:34 Requested date-time:  Receipt date-time:  Reported date-time:  Referring Physician:   Ordering Physician: Melvia Heaps [phone]) Specimen Source:  Source: Launa Grill Order Number: [id number] Lab site:

## 2011-01-23 NOTE — Progress Notes (Signed)
Summary: sample MoviPrep kit  Phone Note Outgoing Call Call back at [facility] Phone [phone]   Call placed by: Merri Ray CMA Duncan Dull),  January 03, 2011 10:04 AM Summary of Call: Contacted pt to inform her that we recived a letter from her pharmacy that her insurance will not cover the MoviPrep. L/M for pt that I have a sample MoviPrep kit here for her.  Initial call taken by: Merri Ray CMA (AAMA),  January 03, 2011 10:05 AM

## 2011-01-23 NOTE — Progress Notes (Signed)
Summary: refill/gg  Phone Note Refill Request  on December 26, 2010 1:00 PM  Refills Requested: Medication #1:  HUMALOG MIX 75/25 PEN 75-25 % SUSP Inject under the skin 110 units in the AM and 55 units in the evening every day  Medication #2:  RA PEN NEEDLES 31G X 8 MM MISC use 3 times per day to check the sugar level  Medication #3:  TRUETRACK TEST  STRP use to check blood sugar twice daily  Medication #4:  LEXAPRO 10 MG TABS Take 1 tablet by mouth once a day Pt uses the PEN not vials.  please correct from last Rx. call pt when ready.   Method Requested: Electronic Initial call taken by: Merrie Roof RN,  December 26, 2010 1:01 PM  Follow-up for Phone Call        completed refill, thank you Jennifer [name]  Follow-up by: Mliss Sax MD,  December 27, 2010 9:11 AM    Prescriptions: LEXAPRO 10 MG TABS (ESCITALOPRAM OXALATE) Take 1 tablet by mouth once a day  #30 x 7   Entered and Authorized by:   Mliss Sax MD   Signed by:   Mliss Sax MD on 12/27/2010   Method used:   [facility]        [id number]       [address]       [place], [place]  [address]       Ph: [phone] or [phone]       Fax: [phone]   RxID:   [id number] LANCETS  MISC (LANCETS) use to check blood sugar twice daily  #1 pack x 7   Entered and Authorized by:   Mliss Sax MD   Signed by:   Mliss Sax MD on 12/27/2010   Method used:   [facility]        [facility]       [address]       [place], [place]  [address]       Ph: [phone] or [phone]       Fax: [phone]   RxID:   [id number] TDVVOHYWV TEST  STRP (GLUCOSE BLOOD) use to check blood sugar twice daily  #1 pack x 7   Entered and Authorized by:   Mliss Sax MD   Signed by:   Mliss Sax MD on 12/27/2010   Method used:   [facility]ically [facility]        [facility]  [address] [id number]       [address]  [place], [place]  [address]       Ph: [phone] or [phone]       Fax: [phone]   RxID:   [id number] HUMALOG MIX 75/25 PEN 75-25 % SUSP (INSULIN LISPRO PROT & LISPRO) Inject under the skin 110 units in the AM and 55 units in the evening every day  #4 packs x 7   Entered and Authorized by:   Mliss Sax MD   Signed by:   Mliss Sax MD on 12/27/2010   Method used:   [facility]ically [facility]        [facility]  [address] [facility]       [address]       [place], [place]  [address]  Ph: [phone] or [phone]       Fax: [phone]   RxID:   [id number] RA PEN NEEDLES 31G X 8 MM MISC (INSULIN PEN NEEDLE) use 3 times per day to check the sugar level  #90 x 7   Entered and Authorized by:   Mliss Sax MD   Signed by:   Mliss Sax MD on 12/27/2010   Method used:   [facility]        [facility]  [address] [facility]       [address]       [place], [place]  [address]       Ph: [phone] or [phone]       Fax: [phone]   RxID:   [id number]

## 2011-01-23 NOTE — Progress Notes (Signed)
Summary: Rash/ appointment for Eye doctor  Phone Note Call from Patient   Caller: Patient Call For: Mliss Sax MD Summary of Call: Call from pt to ask about her Eye appointment.  Pt states that she was never called by [employer].  Pt was placed on a call list.  Call to [facility]. Pt was given an appointment for 01/16/2011 2:oo PM pt to arrive at 1:30 PM.  Pt also had complaints of a rash in between and on top of her tjigs.  Said that rash itches. Pt was given an appointment for Thursday and told to go to the Urgent Care if it worsens.  Pt was alsoasked to call the Clinics as well if the rash worsens over night.  has had the rash for a few days.  Pt is Diabetic.   Pt agreed to go to Urgent Care and to call tomorrow.  Otherwise will keep her scheduled appointment on Thursday. Angelina Ok RN  January 07, 2011 2:43 PM

## 2011-01-23 NOTE — Progress Notes (Signed)
Summary: refill/gg  Phone Note Refill Request   Refills Requested: Medication #1:  HUMALOG MIX 75/25 PEN 75-25 % SUSP Inject under the skin 110 units in the AM and 55 units in the evening every day Pt needs  # 4  paks of   insulin for a month. Also pt has yeast infection  and asking for diflucan for treatment.   Method Requested: Electronic Initial call taken by: Merrie Roof RN,  December 19, 2010 4:22 PM  Follow-up for Phone Call        Refill approved-nurse to complete Follow-up by: Julaine Fusi  DO,  December 19, 2010 4:58 PM    Prescriptions: HUMALOG MIX 75/25 PEN 75-25 % SUSP (INSULIN LISPRO PROT & LISPRO) Inject under the skin 110 units in the AM and 55 units in the evening every day  #4 packs x 5   Entered by:   Julaine Fusi  DO   Authorized by:   Mliss Sax MD   Signed by:   Julaine Fusi  DO on 12/19/2010   Method used:   [facility]        [id number]       [address]       [place], [place]  [address]       Ph: [phone] or [phone]       Fax: [phone]   RxID:   [id number] DIFLUCAN 100 MG TABS (FLUCONAZOLE) Take 1 tablet by mouth two times a day  #10 x 0   Entered by:   Julaine Fusi  DO   Authorized by:   Mliss Sax MD   Signed by:   Julaine Fusi  DO on 12/19/2010   Method used:   [facility]ically [facility]        [facility]  [address] [facility]       [address]       [place], [place]  [address]       Ph: [phone] or [phone]       Fax: [phone]   RxID:   [id number]

## 2011-01-23 NOTE — Progress Notes (Signed)
Summary: ? re insulin  Phone Note Call from Patient Call back at [phone]   Caller: Patient Call For: Dr Arlyce Dice Reason for Call: Talk to Nurse Summary of Call: Patient has questions regarding her insulin her proc is scheduled for tomorrow Initial call taken by: Tawni Levy,  January 14, 2011 4:18 PM  Follow-up for Phone Call        Spoke with patient and reviewed the diabetic instruction sheet that was given to the patient. Patient verbalized understanding. Follow-up by: Selinda Michaels RN,  January 14, 2011 4:24 PM

## 2011-01-23 NOTE — Procedures (Addendum)
Summary: Upper Endoscopy  Patient: Jennifer [name] Note: All result statuses are Final unless otherwise noted.  Tests: (1) Upper Endoscopy (EGD)   EGD Upper Endoscopy       DONE     [facility]     [address]     [place], [place]  [address]           ENDOSCOPY PROCEDURE REPORT           PATIENT:  [name], [name]  MR#:  [id number]     BIRTHDATE:  1961-07-08, 49 yrs. old  GENDER:  female           ENDOSCOPIST:  Barbette Hair. Arlyce Dice, MD     Referred by:  Marin Roberts, M.D.           PROCEDURE DATE:  01/15/2011     PROCEDURE:  EGD, diagnostic 43235, Maloney Dilation of Esophagus     ASA CLASS:  Class II     INDICATIONS:  dysphagia           MEDICATIONS:   There was residual sedation effect present from     prior procedure., MAC sedation, administered by CRNA Propofol     170mg  IV     TOPICAL ANESTHETIC:           DESCRIPTION OF PROCEDURE:   After the risks benefits and     alternatives of the procedure were thoroughly explained, informed     consent was obtained.  The LB GIF-H180 [id number] endoscope was     introduced through the mouth and advanced to the third portion of     the duodenum, without limitations.  The instrument was slowly     withdrawn as the mucosa was fully examined.     <<PROCEDUREIMAGES>>           A stricture was found at the gastroesophageal junction (see     image7). Early stricture Dilation with maloney dilator 18mm     Minimal resistance; no heme  Otherwise the examination was normal     (see image1, image2, image3, image4, and image5).    Retroflexed     views revealed no abnormalities.    The scope was then withdrawn     from the patient and the procedure completed.           COMPLICATIONS:  None           ENDOSCOPIC IMPRESSION:     1) Stricture at the gastroesophageal junction - s/p maloney     dilitation     2) Otherwise normal examination     RECOMMENDATIONS:     1) dilatations PRN     2) resume plavix 5 days           REPEAT  EXAM:  No           ______________________________     Barbette Hair. Arlyce Dice, MD           CC:           n.     eSIGNED:   Barbette Hair. Daneil Beem at 01/15/2011 09:51 AM           Jacques Navy, [id number]  Note: An exclamation mark (!) indicates a result that was not dispersed into the flowsheet. Document Creation Date: 01/15/2011 9:51 AM _______________________________________________________________________  (1) Order result status: Final Collection or observation date-time: 01/15/2011 09:46 Requested date-time:  Receipt date-time:  Reported date-time:  Referring Physician:   Ordering Physician: Melvia Heaps (  [id number]) Specimen Source:  Source: Launa Grill Order Number: [id number] Lab site:

## 2011-01-23 NOTE — Letter (Signed)
Summary: Results Letter  [facility]  [address] [place], [place] [address]   Phone: [phone]  Fax: [phone]        December 26, 2010 MRN: [id number]    Jennifer [name] [address] [place], [place]  [address]    Dear Ms. KHURANA,  It is my pleasure to have treated you recently as a new patient in my office. I appreciate your confidence and the opportunity to participate in your care.  Since I do have a busy inpatient endoscopy schedule and office schedule, my office hours vary weekly. I am, however, available for emergency calls everyday through my office. If I am not available for an urgent office appointment, another one of our gastroenterologist will be able to assist you.  My well-trained staff are prepared to help you at all times. For emergencies after office hours, a physician from our Gastroenterology section is always available through my 24 hour answering service  Once again I welcome you as a new patient and I look forward to a happy and healthy relationship             Sincerely,  Louis Meckel MD  This letter has been electronically signed by your physician.  Appended Document: Results Letter LETTER MAILED

## 2011-01-23 NOTE — Assessment & Plan Note (Signed)
Summary: ACUTE-RASH-ITCHING AND BURNING IN VAGINA/CFB([facility])   Vital Signs:  Patient profile:   50 year old female Height:      66.6 inches (169.16 cm) Weight:      253.04 pounds (115.02 kg) BMI:     40.25 Temp:     97 degrees F (36.11 degrees C) oral Pulse rate:   63 / minute BP sitting:   135 / 72  (right arm) Cuff size:   large  Vitals Entered By: Angelina Ok RN (January 09, 2011 2:39 PM) CC: rash Is Patient Diabetic? Yes Did you bring your meter with you today? Yes Pain Assessment Patient in pain? yes     Location: all over Intensity: 10 Type: aching, throbbing, burning Nutritional Status BMI of > 30 = obese CBG Result 390  Have you ever been in a relationship where you felt threatened, hurt or afraid?No   Does patient need assistance? Functional Status Self care Ambulation Normal Comments Right thight and rash on right hand.  Itching rash. Had pus.  Has dried up but still painful.   Primary Care Provider:  Mliss Sax, MD   CC:  rash.  History of Present Illness: 50 y/o with h/o HTN, DM, HLD comes to the clinic c/o rash on the right thigh and wrist.   this has been there for a week, is very painfull, had purulent drainage which dried out after a while. she never had simialr symptoms before. she denies fever, chills, or other constitutional symptoms. she had chicken pox as a child. she has not tried any medication for this symptoms.    Depression History:      The patient is having a depressed mood most of the day and has a diminished interest in her usual daily activities.        The patient denies that she feels like life is not worth living, denies that she wishes that she were dead, and denies that she has thought about ending her life.        Comments:  On meds.  Wants somrhing different.   Preventive Screening-Counseling & Management  Alcohol-Tobacco     Alcohol drinks/day: 0     Smoking Status: current     Smoking Cessation Counseling: yes  Packs/Day: 0.25     Year Started: years     Passive Smoke Exposure: yes  Current Medications (verified): 1)  Atenolol 100 Mg Tabs (Atenolol) .... Take 1 Tablet By Mouth Once A Day 2)  Lisinopril 20 Mg Tabs (Lisinopril) .... Take 1 Tablet By Mouth Once A Day 3)  Hydrochlorothiazide 25 Mg Tabs (Hydrochlorothiazide) .... Take 1 Tablet By Mouth Once A Day 4)  Advair Diskus 250-50 Mcg/dose Misc (Fluticasone-Salmeterol) .... One Puff Two Times A Day 5)  Plavix 75 Mg  Tabs (Clopidogrel Bisulfate) .... Take 1 Tablet By Mouth Once A Day 6)  Ra Pen Needles 31g X 8 Mm Misc (Insulin Pen Needle) .... Use 3 Times Per Day To Check The Sugar Level 7)  Lyrica 100 Mg Caps (Pregabalin) .... Take 1 Tablet By Mouth Three Times A Day 8)  Humalog Mix 75/25 Pen 75-25 % Susp (Insulin Lispro Prot & Lispro) .... Inject Under The Skin 110 Units in The Am and 55 Units in The Evening Every Day 9)  Truetrack Test  Strp (Glucose Blood) .... Use To Check Blood Sugar Twice Daily 10)  Lancets  Misc (Lancets) .... Use To Check Blood Sugar Twice Daily 11)  Nitrostat 0.4 Mg Subl (Nitroglycerin) .... Take  One Tablet Under Your Tongue With Severe Chest Pain, Repeat in 5 Minutes. Attend To Doctor/hospital If Had To Take More Then 3 in 20 Minutes 12)  Diflucan 100 Mg Tabs (Fluconazole) .... Take 1 Tablet By Mouth Two Times A Day 13)  Lexapro 10 Mg Tabs (Escitalopram Oxalate) .... Take 1 Tablet By Mouth Once A Day 14)  Hydrocodone-Acetaminophen 5-325 Mg Tabs (Hydrocodone-Acetaminophen) .... Take 1 Tablet By Mouth Two Times A Day As Needed For Pain. 15)  Zoloft 25 Mg Tabs (Sertraline Hcl) .... Take 1 Tablet By Mouth Once A Day 16)  Trazodone Hcl 100 Mg Tabs (Trazodone Hcl) .... Take 1/2 Tablet At Bedtime As Needed For Insomnia 17)  Tramadol Hcl 50 Mg Tabs (Tramadol Hcl) .... Take 1 Tablet 2-3 Times Per Day As Needed For Pain 18)  Aspirin 81 Mg  Tabs (Aspirin) .... One Tablet By Mouth Once Daily 19)  Prilosec Otc 20 Mg Tbec (Omeprazole  Magnesium) .... As Needed 20)  Tums 500 Mg Chew (Calcium Carbonate Antacid) .... As Needed 21)  Moviprep 100 Gm  Solr (Peg-Kcl-Nacl-Nasulf-Na Asc-C) .... As Per Prep Instructions. 22)  Valacyclovir Hcl 1 Gm Tabs (Valacyclovir Hcl) .... Three Times A Day For 7 Days  Allergies (verified): 1)  ! Morphine Sulfate (Morphine Sulfate) 2)  ! * Iodinated Contrast  Social History: Packs/Day:  0.25  Review of Systems  The patient denies anorexia, fever, weight loss, weight gain, vision loss, decreased hearing, hoarseness, chest pain, syncope, dyspnea on exertion, peripheral edema, prolonged cough, headaches, hemoptysis, abdominal pain, melena, hematochezia, severe indigestion/heartburn, hematuria, incontinence, genital sores, muscle weakness, suspicious skin lesions, transient blindness, difficulty walking, depression, unusual weight change, abnormal bleeding, enlarged lymph nodes, angioedema, breast masses, and testicular masses.    Physical Exam  General:  alert, well-developed, well-hydrated, and overweight-appearing.   Head:  normocephalic and atraumatic.   Eyes:  vision grossly intact, pupils equal, and pupils round.   Ears:  R ear normal and L ear normal.   Nose:  no external deformity.   Neck:  supple and full ROM.   Lungs:  normal respiratory effort, no intercostal retractions, no accessory muscle use, normal breath sounds, and no dullness.   Heart:  normal rate, regular rhythm, no murmur, and no gallop.   Abdomen:  soft, non-tender, normal bowel sounds, and no distention.   Skin:  small vesicle on the left hand on dorsal surface, with no drainaige, tender to touch. 4-5 2 mm vesicles on the right thigh in a dermatomal distribution, tender, non draining, no superficial infection Cervical Nodes:  no anterior cervical adenopathy and no posterior cervical adenopathy.   Axillary Nodes:  no R axillary adenopathy and no L axillary adenopathy.   Inguinal Nodes:  no R inguinal adenopathy and no L  inguinal adenopathy.     Impression & Recommendations:  Problem # 1:  SHINGLES (ICD-053.9) her current rash looks like shingles ( right thigh and right hand) will try valtrex for a week. she has pregabalin for pain control follow up at next visit  Complete Medication List: 1)  Atenolol 100 Mg Tabs (Atenolol) .... Take 1 tablet by mouth once a day 2)  Lisinopril 20 Mg Tabs (Lisinopril) .... Take 1 tablet by mouth once a day 3)  Hydrochlorothiazide 25 Mg Tabs (Hydrochlorothiazide) .... Take 1 tablet by mouth once a day 4)  Advair Diskus 250-50 Mcg/dose Misc (Fluticasone-salmeterol) .... One puff two times a day 5)  Plavix 75 Mg Tabs (Clopidogrel bisulfate) .... Take 1 tablet by  mouth once a day 6)  Ra Pen Needles 31g X 8 Mm Misc (Insulin pen needle) .... Use 3 times per day to check the sugar level 7)  Lyrica 100 Mg Caps (Pregabalin) .... Take 1 tablet by mouth three times a day 8)  Humalog Mix 75/25 Pen 75-25 % Susp (Insulin lispro prot & lispro) .... Inject under the skin 110 units in the am and 55 units in the evening every day 9)  Truetrack Test Strp (Glucose blood) .... Use to check blood sugar twice daily 10)  Lancets Misc (Lancets) .... Use to check blood sugar twice daily 11)  Nitrostat 0.4 Mg Subl (Nitroglycerin) .... Take one tablet under your tongue with severe chest pain, repeat in 5 minutes. attend to doctor/hospital if had to take more then 3 in 20 minutes 12)  Diflucan 100 Mg Tabs (Fluconazole) .... Take 1 tablet by mouth two times a day 13)  Lexapro 10 Mg Tabs (Escitalopram oxalate) .... Take 1 tablet by mouth once a day 14)  Hydrocodone-acetaminophen 5-325 Mg Tabs (Hydrocodone-acetaminophen) .... Take 1 tablet by mouth two times a day as needed for pain. 15)  Zoloft 25 Mg Tabs (Sertraline hcl) .... Take 1 tablet by mouth once a day 16)  Trazodone Hcl 100 Mg Tabs (Trazodone hcl) .... Take 1/2 tablet at bedtime as needed for insomnia 17)  Tramadol Hcl 50 Mg Tabs (Tramadol hcl)  .... Take 1 tablet 2-3 times per day as needed for pain 18)  Aspirin 81 Mg Tabs (Aspirin) .... One tablet by mouth once daily 19)  Prilosec Otc 20 Mg Tbec (Omeprazole magnesium) .... As needed 20)  Tums 500 Mg Chew (Calcium carbonate antacid) .... As needed 21)  Moviprep 100 Gm Solr (Peg-kcl-nacl-nasulf-na asc-c) .... As per prep instructions. 22)  Valacyclovir Hcl 1 Gm Tabs (Valacyclovir hcl) .... Three times a day for 7 days  Patient Instructions: 1)  Please schedule a follow-up appointment in 1 monthwith pcp Prescriptions: VALACYCLOVIR HCL 1 GM TABS (VALACYCLOVIR HCL) three times a day for 7 days  #21 x 0   Entered and Authorized by:   Bethel Born MD   Signed by:   Bethel Born MD on 01/09/2011   Method used:   [facility]        [facility]  [address] [id number]       [address]       [place], [place]  [address]       Ph: [phone] or [phone]       Fax: [phone]   RxID:   [id number]      Prevention & Chronic Care Immunizations   Influenza vaccine: Fluvax Non-MCR  (08/20/2010)   Influenza vaccine deferral: Not indicated  (04/01/2010)    Tetanus booster: Not documented   Td booster deferral: Not indicated  (10/25/2009)    Pneumococcal vaccine: Not documented  Colorectal Screening   Hemoccult: Not documented   Hemoccult action/deferral: Ordered  (11/05/2010)    Colonoscopy: Not documented   Colonoscopy action/deferral: Not indicated  (10/25/2009)  Other Screening   Pap smear: Normal  (08/17/2001)   Pap smear action/deferral: Not indicated S/P hysterectomy  (11/05/2010)    Mammogram: No specific mammographic evidence of malignancy.  No significant changes compared to previous study.  Assessment: BIRADS 1.   (07/24/2010)   Mammogram action/deferral: Ordered  (06/28/2010)   Mammogram due: 07/2011   Smoking status: current  (01/09/2011)   Smoking cessation counseling: yes  (01/09/2011)  Diabetes Mellitus   HgbA1C:  10.6  (11/05/2010)   HgbA1C action/deferral: Ordered  (09/14/2009)    Eye exam: No diabetic retinopathy.     (05/31/2008)   Diabetic eye exam action/deferral: Ophthalmology referral  (06/28/2010)   Eye exam due: 05/2009    Foot exam: yes  (11/05/2010)   Foot exam action/deferral: Do today   High risk foot: Yes  (11/05/2010)   Foot care education: Done  (11/05/2010)   Foot exam due: 11/06/2011    Urine microalbumin/creatinine ratio: 9.4  (09/14/2009)   Urine microalbumin action/deferral: Ordered  Lipids   Total Cholesterol: 173  (02/07/2010)   LDL: 109  (02/07/2010)   LDL Direct: Not documented   HDL: 35  (02/07/2010)   Triglycerides: 145  (02/07/2010)    SGOT (AST): 37  (02/07/2010)   SGPT (ALT): 39  (02/07/2010)   Alkaline phosphatase: 85  (02/07/2010)   Total bilirubin: 0.3  (02/07/2010)  Hypertension   Last Blood Pressure: 135 / 72  (01/09/2011)   Serum creatinine: 0.67  (02/07/2010)   Serum potassium 3.9  (02/07/2010)  Self-Management Support :   Personal Goals (by the next clinic visit) :     Personal A1C goal: 7  (09/14/2009)     Personal blood pressure goal: 130/80  (09/14/2009)     Personal LDL goal: 100  (09/14/2009)    Patient will work on the following items until the next clinic visit to reach self-care goals:     Medications and monitoring: take my medicines every day, check my blood sugar, bring all of my medications to every visit, examine my feet every day  (01/09/2011)     Eating: drink diet soda or water instead of juice or soda, eat more vegetables, use fresh or frozen vegetables, eat foods that are low in salt, eat baked foods instead of fried foods, eat fruit for snacks and desserts, limit or avoid alcohol  (01/09/2011)     Activity: take a 30 minute walk every day  (01/09/2011)    Diabetes self-management support: Written self-care plan, Education handout, Pre-printed educational material, Resources for patients handout  (01/09/2011)   Diabetes care  plan printed   Diabetes education handout printed   Last diabetes self-management training by diabetes educator: 06/03/2010    Hypertension self-management support: Written self-care plan, Education handout, Pre-printed educational material, Resources for patients handout  (01/09/2011)   Hypertension self-care plan printed.   Hypertension education handout printed    Lipid self-management support: Written self-care plan, Education handout, Pre-printed educational material, Resources for patients handout  (01/09/2011)   Lipid self-care plan printed.   Lipid education handout printed      Resource handout printed.    Vital Signs:  Patient profile:   50 year old female Height:      66.6 inches (169.16 cm) Weight:      253.04 pounds (115.02 kg) BMI:     40.25 Temp:     97 degrees F (36.11 degrees C) oral Pulse rate:   63 / minute BP sitting:   135 / 72  (right arm) Cuff size:   large  Vitals Entered By: Angelina Ok RN (January 09, 2011 2:39 PM)

## 2011-01-23 NOTE — Letter (Signed)
Summary: Diabetic Instructions  [facility]  [address] [place], [place] [address]   Phone: [phone]  Fax: [phone]    Jennifer [name] March 09, 1961 MRN: [id number]   _  _   ORAL DIABETIC MEDICATION INSTRUCTIONS  The day before your procedure:   Take your diabetic pill as you do normally  The day of your procedure:   Do not take your diabetic pill    We will check your blood sugar levels during the admission process and again in Recovery before discharging you home  ________________________________________________________________________  _  _   INSULIN (LONG ACTING) MEDICATION INSTRUCTIONS (Lantus, NPH, 70/30, Humulin, Novolin-N)   The day before your procedure:   Take  your regular evening dose    The day of your procedure:   Do not take your morning dose    X    INSULIN (SHORT ACTING) MEDICATION INSTRUCTIONS (Regular, Humulog, Novolog)   The day before your procedure:   Do not take your evening dose   The day of your procedure:   Do not take your morning dose   _  _   INSULIN PUMP MEDICATION INSTRUCTIONS  We will contact the physician managing your diabetic care for written dosage instructions for the day before your procedure and the day of your procedure.  Once we have received the instructions, we will contact you.

## 2011-01-23 NOTE — Letter (Signed)
Summary: Moviprep Instructions/EGD-DIL  [facility]  [address] [place], [place] [address]   Phone: [phone]  Fax: [phone]       Jennifer [name]    Jul 29, 1961    MRN: [id number]        Procedure Day /Date:WEDNESDAY 01/15/2011     Arrival Time:8AM     Procedure Time:9AM     Location of Procedure:                    X   [facility] (4th Floor)  PREPARATION FOR COLONOSCOPY WITH MOVIPREP/PROPOFOL   Starting 5 days prior to your procedure _ _ do not eat nuts, seeds, popcorn, corn, beans, peas,  salads, or any raw vegetables.  Do not take any fiber supplements (e.g. Metamucil, Citrucel, and Benefiber).  THE DAY BEFORE YOUR PROCEDURE         DATE: 01/14/2011  DAY: TUESDAY  1.  Drink clear liquids the entire day-NO SOLID FOOD  2.  Do not drink anything colored red or purple.  Avoid juices with pulp.  No orange juice.  3.  Drink at least 64 oz. (8 glasses) of fluid/clear liquids during the day to prevent dehydration and help the prep work efficiently.  CLEAR LIQUIDS INCLUDE: Water Jello Ice Popsicles Tea (sugar ok, no milk/cream) Powdered fruit flavored drinks Coffee (sugar ok, no milk/cream) Gatorade Juice: apple, white grape, white cranberry  Lemonade Clear bullion, consomm, broth Carbonated beverages (any kind) Strained chicken noodle soup Hard Candy                             4.  In the morning, mix first dose of MoviPrep solution:    Empty 1 Pouch A and 1 Pouch B into the disposable container    Add lukewarm drinking water to the top line of the container. Mix to dissolve    Refrigerate (mixed solution should be used within 24 hrs)  5.  Begin drinking the prep at 5:00 p.m. The MoviPrep container is divided by 4 marks.   Every 15 minutes drink the solution down to the next mark (approximately 8 oz) until the full liter is complete.   6.  Follow completed prep with 16 oz of clear liquid of your choice (Nothing red or purple).   Continue to drink clear liquids until bedtime.  7.  Before going to bed, mix second dose of MoviPrep solution:    Empty 1 Pouch A and 1 Pouch B into the disposable container    Add lukewarm drinking water to the top line of the container. Mix to dissolve    Refrigerate  THE DAY OF YOUR PROCEDURE      DATE: 01/15/2011 DAY: WEDNESDAY  Beginning at 4 a.m. (5 hours before procedure):         1. Every 15 minutes, drink the solution down to the next mark (approx 8 oz) until the full liter is complete.  2. Follow completed prep with 16 oz. of clear liquid of your choice.    3. You may drink clear liquids until 7AM (2 HOURS BEFORE PROCEDURE).   MEDICATION INSTRUCTIONS  Unless otherwise instructed, you should take regular prescription medications with a small sip of water   as early as possible the morning of your procedure.  Diabetic patients - see separate instructions.  Stop taking Plavix 01/08/2011 YOU WILL HOLD YOUR PLAVIX PER DR Arlyce Dice  OTHER INSTRUCTIONS  You will need a responsible adult at least 52 years of age to accompany you and drive you home.   This person must remain in the waiting room during your procedure.  Wear loose fitting clothing that is easily removed.  Leave jewelry and other valuables at home.  However, you may wish to bring a book to read or  an iPod/MP3 player to listen to music as you wait for your procedure to start.  Remove all body piercing jewelry and leave at home.  Total time from sign-in until discharge is approximately 2-3 hours.  You should go home directly after your procedure and rest.  You can resume normal activities the  day after your procedure.  The day of your procedure you should not:   Drive   Make legal decisions   Operate machinery   Drink alcohol   Return to work  You will receive specific instructions about eating, activities and medications before you leave.    The above instructions have been reviewed  and explained to me by   _______________________    I fully understand and can verbalize these instructions _____________________________ Date _________

## 2011-01-24 ENCOUNTER — Other Ambulatory Visit: Payer: Self-pay | Admitting: Internal Medicine

## 2011-01-24 ENCOUNTER — Other Ambulatory Visit: Payer: Self-pay | Admitting: *Deleted

## 2011-01-24 MED ORDER — FLUCONAZOLE 100 MG PO TABS: 100.0000 mg | ORAL_TABLET | Freq: Two times a day (BID) | ORAL | Status: DC

## 2011-01-24 MED ORDER — FLUTICASONE-SALMETEROL 250-50 MCG/DOSE IN AEPB: 1.0000 | INHALATION_SPRAY | Freq: Two times a day (BID) | RESPIRATORY_TRACT | Status: DC

## 2011-01-24 NOTE — Telephone Encounter (Signed)
Launa Grill, I entered the medication into her profile and I am not sure what else to da, I did approve it, I am just now sure what else to do. Please let me know, thanks [employer]

## 2011-01-24 NOTE — Telephone Encounter (Signed)
Refill is for fluconazole 100 mg 1 bid.  It's in her old record.  Will you refill?

## 2011-01-25 ENCOUNTER — Other Ambulatory Visit: Payer: Self-pay | Admitting: *Deleted

## 2011-01-27 MED ORDER — FLUCONAZOLE 100 MG PO TABS: 100.0000 mg | ORAL_TABLET | Freq: Two times a day (BID) | ORAL | Status: AC

## 2011-01-29 NOTE — Letter (Signed)
Summary: Patient Notice-Hyperplastic Polyps  [facility]  [address] [place], [place] [address]   Phone: [phone]  Fax: [phone]        January 21, 2011 MRN: [id number]    Jennifer [name] [address] [place], [place]  [address]    Dear Ms. 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  These types of polyps are NOT pre-cancerous.  It is therefore my recommendation that you have a repeat colonoscopy examination in 10_ years for routine colorectal cancer screening.  Should you develop new or worsening symptoms of abdominal pain, bowel habit changes or bleeding from the rectum or bowels, please schedule an evaluation with either your primary care physician or with me.  Additional information/recommendations:  __No further action with gastroenterology is needed at this time.      Please follow-up with your primary care physician for your other      healthcare needs. __Please call [phone] to schedule a return visit to review      your situation.  __Please keep your follow-up visit as already scheduled.  _x_Continue treatment plan as outlined the day of your exam.  Please call us if you are having persistent problems or have questions about your condition that have not been fully answered at this time.  Sincerely,  Louis Meckel MD This letter has been electronically signed by your physician.  Appended Document: Patient Notice-Hyperplastic Polyps LETTER MAILED

## 2011-01-29 NOTE — Progress Notes (Signed)
Summary: shingle discomfort/ hla  Phone Note Call from Patient   Summary of Call: pt calls and states the rash on her wrist and thigh have dried but has a few new areas that are painful, states she started her medicine late due to not being able to pick it up til late last week, she is ask to finish the med and see how the areas are and if worse she may go to urg care, ask if she is taking lyrica and she answers that yes she is but she will wait or either call back if it becomes worse. Initial call taken by: Marin Roberts RN,  January 20, 2011 2:18 PM

## 2011-01-31 NOTE — Telephone Encounter (Signed)
Called pt and she has received her Rx. Also called pharmacy and clarified that pt is using pens for insulin, they had refilled from old Rx and given her vials.

## 2011-02-07 ENCOUNTER — Other Ambulatory Visit: Payer: Self-pay | Admitting: *Deleted

## 2011-02-07 NOTE — Telephone Encounter (Signed)
Prescription for  Lyricia [facility] mg capsules [facility] [address].  Previous prescription that was sent was not received.

## 2011-02-12 ENCOUNTER — Telehealth: Payer: Self-pay | Admitting: *Deleted

## 2011-02-12 DIAGNOSIS — M7500 Adhesive capsulitis of unspecified shoulder: Secondary | ICD-10-CM

## 2011-02-12 NOTE — Telephone Encounter (Signed)
Pt called and is asking for a referral to Physical Therapy for "frozed Shoulder"   She now has transportation and needs another referral to [facility] on [address]. Dr Patsy Lager with sports med  Suggested this, per pt. This was during the summer.  Now increase pain to shoulder and neck. Pain is constant. Pt # [id number]

## 2011-02-25 ENCOUNTER — Ambulatory Visit: Payer: Self-pay | Admitting: Family Medicine

## 2011-03-03 ENCOUNTER — Ambulatory Visit: Payer: Medicare Other | Attending: Internal Medicine | Admitting: Physical Therapy

## 2011-03-03 DIAGNOSIS — M25519 Pain in unspecified shoulder: Secondary | ICD-10-CM | POA: Insufficient documentation

## 2011-03-03 DIAGNOSIS — IMO0001 Reserved for inherently not codable concepts without codable children: Secondary | ICD-10-CM | POA: Insufficient documentation

## 2011-03-03 DIAGNOSIS — M25619 Stiffness of unspecified shoulder, not elsewhere classified: Secondary | ICD-10-CM | POA: Insufficient documentation

## 2011-03-03 DIAGNOSIS — M6281 Muscle weakness (generalized): Secondary | ICD-10-CM | POA: Insufficient documentation

## 2011-03-04 ENCOUNTER — Telehealth: Payer: Self-pay | Admitting: *Deleted

## 2011-03-04 LAB — GLUCOSE, CAPILLARY: Glucose-Capillary: 257 mg/dL — ABNORMAL HIGH (ref 70–99)

## 2011-03-04 NOTE — Telephone Encounter (Signed)
Pt called asking for a Rx for diabetic shoes. She had one but the Rx expired.  Pt # [id number]

## 2011-03-06 ENCOUNTER — Ambulatory Visit: Payer: Medicare Other | Admitting: Rehabilitation

## 2011-03-06 LAB — GLUCOSE, CAPILLARY: Glucose-Capillary: 245 mg/dL — ABNORMAL HIGH (ref 70–99)

## 2011-03-07 LAB — GLUCOSE, CAPILLARY: Glucose-Capillary: 248 mg/dL — ABNORMAL HIGH (ref 70–99)

## 2011-03-09 LAB — GLUCOSE, CAPILLARY: Glucose-Capillary: 293 mg/dL — ABNORMAL HIGH (ref 70–99)

## 2011-03-10 ENCOUNTER — Ambulatory Visit: Payer: Medicare Other | Admitting: Physical Therapy

## 2011-03-10 LAB — GLUCOSE, CAPILLARY: Glucose-Capillary: 386 mg/dL — ABNORMAL HIGH (ref 70–99)

## 2011-03-12 LAB — GLUCOSE, CAPILLARY: Glucose-Capillary: 250 mg/dL — ABNORMAL HIGH (ref 70–99)

## 2011-03-13 ENCOUNTER — Ambulatory Visit: Payer: Medicare Other | Admitting: Physical Therapy

## 2011-03-17 ENCOUNTER — Ambulatory Visit: Payer: Medicare Other | Admitting: Physical Therapy

## 2011-03-19 ENCOUNTER — Ambulatory Visit: Payer: Medicare Other | Admitting: Physical Therapy

## 2011-03-24 ENCOUNTER — Encounter: Payer: Medicare Other | Admitting: Physical Therapy

## 2011-03-25 ENCOUNTER — Other Ambulatory Visit: Payer: Self-pay | Admitting: Internal Medicine

## 2011-03-25 LAB — GLUCOSE, CAPILLARY: Glucose-Capillary: 160 mg/dL — ABNORMAL HIGH (ref 70–99)

## 2011-03-26 LAB — GLUCOSE, CAPILLARY: Glucose-Capillary: 329 mg/dL — ABNORMAL HIGH (ref 70–99)

## 2011-03-27 ENCOUNTER — Ambulatory Visit: Payer: Medicare Other | Attending: Internal Medicine | Admitting: Physical Therapy

## 2011-03-27 DIAGNOSIS — M6281 Muscle weakness (generalized): Secondary | ICD-10-CM | POA: Insufficient documentation

## 2011-03-27 DIAGNOSIS — M25619 Stiffness of unspecified shoulder, not elsewhere classified: Secondary | ICD-10-CM | POA: Insufficient documentation

## 2011-03-27 DIAGNOSIS — M25519 Pain in unspecified shoulder: Secondary | ICD-10-CM | POA: Insufficient documentation

## 2011-03-27 DIAGNOSIS — IMO0001 Reserved for inherently not codable concepts without codable children: Secondary | ICD-10-CM | POA: Insufficient documentation

## 2011-03-28 LAB — GLUCOSE, CAPILLARY
Glucose-Capillary: 352 mg/dL — ABNORMAL HIGH (ref 70–99)
Glucose-Capillary: 380 mg/dL — ABNORMAL HIGH (ref 70–99)

## 2011-03-29 LAB — GLUCOSE, CAPILLARY: Glucose-Capillary: 208 mg/dL — ABNORMAL HIGH (ref 70–99)

## 2011-03-30 LAB — GLUCOSE, CAPILLARY
Glucose-Capillary: 283 mg/dL — ABNORMAL HIGH (ref 70–99)
Glucose-Capillary: 207 mg/dL — ABNORMAL HIGH (ref 70–99)

## 2011-03-31 LAB — GLUCOSE, CAPILLARY: Glucose-Capillary: 204 mg/dL — ABNORMAL HIGH (ref 70–99)

## 2011-04-07 ENCOUNTER — Ambulatory Visit: Payer: Medicare Other

## 2011-04-08 LAB — GLUCOSE, CAPILLARY: Glucose-Capillary: 421 mg/dL — ABNORMAL HIGH (ref 70–99)

## 2011-04-14 ENCOUNTER — Encounter: Payer: Medicare Other | Admitting: Physical Therapy

## 2011-04-16 ENCOUNTER — Encounter: Payer: Medicare Other | Admitting: Physical Therapy

## 2011-04-16 ENCOUNTER — Ambulatory Visit: Payer: Medicare Other | Admitting: Physical Therapy

## 2011-04-22 ENCOUNTER — Ambulatory Visit: Payer: Medicare Other | Attending: Internal Medicine | Admitting: Physical Therapy

## 2011-04-22 DIAGNOSIS — M25519 Pain in unspecified shoulder: Secondary | ICD-10-CM | POA: Insufficient documentation

## 2011-04-22 DIAGNOSIS — M6281 Muscle weakness (generalized): Secondary | ICD-10-CM | POA: Insufficient documentation

## 2011-04-22 DIAGNOSIS — M25619 Stiffness of unspecified shoulder, not elsewhere classified: Secondary | ICD-10-CM | POA: Insufficient documentation

## 2011-04-22 DIAGNOSIS — IMO0001 Reserved for inherently not codable concepts without codable children: Secondary | ICD-10-CM | POA: Insufficient documentation

## 2011-04-24 ENCOUNTER — Ambulatory Visit: Payer: Medicare Other | Admitting: Physical Therapy

## 2011-04-30 ENCOUNTER — Ambulatory Visit: Payer: Medicare Other | Admitting: Physical Therapy

## 2011-05-02 ENCOUNTER — Ambulatory Visit (INDEPENDENT_AMBULATORY_CARE_PROVIDER_SITE_OTHER): Payer: Medicare Other | Admitting: Internal Medicine

## 2011-05-02 ENCOUNTER — Encounter: Payer: Self-pay | Admitting: Internal Medicine

## 2011-05-02 VITALS — BP 135/82 | HR 65 | Temp 97.2°F | Ht 66.5 in | Wt 249.0 lb

## 2011-05-02 DIAGNOSIS — L678 Other hair color and hair shaft abnormalities: Secondary | ICD-10-CM

## 2011-05-02 DIAGNOSIS — IMO0002 Reserved for concepts with insufficient information to code with codable children: Secondary | ICD-10-CM

## 2011-05-02 DIAGNOSIS — K5730 Diverticulosis of large intestine without perforation or abscess without bleeding: Secondary | ICD-10-CM

## 2011-05-02 DIAGNOSIS — I10 Essential (primary) hypertension: Secondary | ICD-10-CM

## 2011-05-02 DIAGNOSIS — L738 Other specified follicular disorders: Secondary | ICD-10-CM

## 2011-05-02 DIAGNOSIS — L739 Follicular disorder, unspecified: Secondary | ICD-10-CM | POA: Insufficient documentation

## 2011-05-02 DIAGNOSIS — K5790 Diverticulosis of intestine, part unspecified, without perforation or abscess without bleeding: Secondary | ICD-10-CM | POA: Insufficient documentation

## 2011-05-02 DIAGNOSIS — E1165 Type 2 diabetes mellitus with hyperglycemia: Secondary | ICD-10-CM

## 2011-05-02 DIAGNOSIS — G4700 Insomnia, unspecified: Secondary | ICD-10-CM

## 2011-05-02 LAB — GLUCOSE, CAPILLARY: Glucose-Capillary: 292 mg/dL — ABNORMAL HIGH (ref 70–99)

## 2011-05-02 LAB — POCT GLYCOSYLATED HEMOGLOBIN (HGB A1C): Hemoglobin A1C: 9

## 2011-05-02 MED ORDER — INSULIN LISPRO PROT & LISPRO (75-25 MIX) 100 UNIT/ML ~~LOC~~ SUSP: SUBCUTANEOUS | Status: DC

## 2011-05-02 NOTE — Progress Notes (Signed)
Subjective:    [name], female    DOB: 1961/09/29, 50 y.o.   MRN: [id number]  HPI  Jennifer [name] is a 50 year old woman who presents to clinic today complaining of some bumps on her arms and back.  She states she has been having them on and off for several months with the last episode starting a few day prior to today's visit.  She had three bumps on her left forearm that swelled up, became red and very itching, and then resolved.  She denies any drainage from the lesions, pain, fevers, or chills.    She has also had stomach pains for the last 3 months or so.  The pain is in the left lower quadrant and come and go.  They do seem to improve with a bowel movement.  She denies any diarrhea, nausea, vomiting, fevers, chills, blood in her stool, or changes in the caliper of her stools.  She has no family history of colon cancer and recently had a colonoscopy that did have a few benign polyps and scattered diverticula noted.  She also has noticed a bump under the skin in her left lower quadrant.       She is due for her A1C today.  She is currently on Humalog 75/25 110 units in the AM and 55 units at bed time.  She did bring her blood sugar record in today and it revealed that her lunch and supper records were elevated in the 200-300 range.  She states she is taking her medications and denies any side effects, increased urination, blurry vision, nausea, shaking, or dizziness.   Review of Systems    Constitutional: Denies fever, chills, diaphoresis, appetite change and fatigue.  HEENT: Denies photophobia, eye pain, redness, hearing loss, ear pain, congestion, sore throat, rhinorrhea, sneezing, mouth sores, trouble swallowing, neck pain, neck stiffness and tinnitus.   Respiratory: Denies SOB, DOE, cough, chest tightness,  and wheezing.   Cardiovascular: Denies chest pain, palpitations and leg swelling.  Gastrointestinal: Denies nausea, vomiting, abdominal pain, diarrhea, constipation,  blood in stool and abdominal distention.  Genitourinary: Denies dysuria, urgency, frequency, hematuria, flank pain and difficulty urinating.  Musculoskeletal: Denies myalgias, back pain, joint swelling, arthralgias and gait problem.  Skin: Denies pallor, rash and wound.  Neurological: Denies dizziness, seizures, syncope, weakness, light-headedness, numbness and headaches.  Hematological: Denies adenopathy. Easy bruising, personal or family bleeding history  Psychiatric/Behavioral: Denies suicidal ideation, mood changes, confusion, nervousness, sleep disturbance and agitation   Objective:   Physical Exam    Constitutional: Vital signs reviewed.  Patient is a well-developed and well-nourished womn in no acute distress and cooperative with exam. Alert and oriented x3.  Head: Normocephalic and atraumatic Ear: TM normal bilaterally Mouth: no erythema or exudates, MMM Eyes: PERRL, EOMI, conjunctivae normal, No scleral icterus.  Neck: Supple, Trachea midline normal ROM, No JVD, mass, thyromegaly, or carotid bruit present.  Cardiovascular: RRR, S1 normal, S2 normal, no MRG, pulses symmetric and intact bilaterally Pulmonary/Chest: CTAB, no wheezes, rales, or rhonchi Abdominal: Soft, non-distended, bowel sounds are normal, no masses, organomegaly, or guarding present.  In the left lower quadrant just lateral to the umbilicius in the mid-clavicular line is a non-tender, 3 mm mobile subcutaneous nodule.  There is mild tenderness to palpation in the LLQ.   GU: no CVA tenderness Musculoskeletal: No joint deformities, erythema, or stiffness, ROM full and no nontender Hematology: no cervical, inginal, or axillary adenopathy.  Neurological: A&O x3, Strength is normal and  symmetric bilaterally, cranial nerve II-XII are grossly intact, no focal motor deficit, sensory intact to light touch bilaterally.  Skin: Warm, dry and intact. No cyanosis, or clubbing. On the left dorsal forearm there are three darker areas  that are 3 mm in diameter. There is no erythema or warmth or drainage.  They seem to follow the hair follicles on the forearm.   Psychiatric: Normal mood and affect. speech and behavior is normal. Judgment and thought content normal. Cognition and memory are normal.    Assessment & Plan:

## 2011-05-02 NOTE — Patient Instructions (Signed)
Diverticulosis Diverticulosis is a common condition that develops when small pouches (diverticula) form in the wall of the colon. The risk of diverticulosis increases with age. It happens more often in people who eat a low-fiber diet. Most individuals with diverticulosis have no symptoms. Those individuals with symptoms usually experience belly (abdominal) pain, constipation, or loose stools (diarrhea). HOME CARE INSTRUCTIONS  Increase the amount of fiber in your diet as directed by your caregiver or dietician. This may reduce symptoms of diverticulosis.   Your caregiver may recommend taking a dietary fiber supplement.   Drink at least 6 to 8 glasses of water each day to prevent constipation.   Try not to strain when you have a bowel movement.   Your caregiver may recommend avoiding nuts and seeds to prevent complications, although this is still an uncertain benefit.   Only take over-the-counter or prescription medicines for pain, discomfort, or fever as directed by your caregiver.  FOODS HAVING HIGH FIBER CONTENT INCLUDE:  Fruits. Apple, peach, pear, tangerine, raisins, prunes.   Vegetables. Brussels sprouts, asparagus, broccoli, cabbage, carrot, cauliflower, romaine lettuce, spinach, summer squash, tomato, winter squash, zucchini.   Starchy Vegetables. Baked beans, kidney beans, lima beans, split peas, lentils, potatoes (with skin).   Grains. Whole wheat bread, brown rice, bran flake cereal, plain oatmeal, white rice, shredded wheat, bran muffins.  SEEK IMMEDIATE MEDICAL CARE IF:  You develop increasing pain or severe bloating.   You have an oral temperature above 100.4, not controlled by medicine.   You develop vomiting or bowel movements that are bloody or black.  Document Released: 09/04/2004 Document Re-Released: 05/28/2010 [facility] Patient Information 2011 [place], [place].  Increase your insulin to 100 units with breakfast and 65 units with supper.  Follow up with your new  PCP in 1-3 months.  If you have any other problems please call us in between then and now.

## 2011-05-06 ENCOUNTER — Ambulatory Visit: Payer: Medicare Other | Admitting: Physical Therapy

## 2011-05-06 NOTE — Assessment & Plan Note (Signed)
[facility]   NAME:  Jennifer [name], Jennifer [name]               ACCOUNT NO.:  [id number]   MEDICAL RECORD NO.:  [id number]      DATE OF BIRTH:  08/13/1961   PHYSICIAN:  Lenon Curt. Chilton Si, M.D.   VISIT DATE:  07/13/2009                                   OFFICE VISIT   HISTORY:  A 50 year old female had an accident which resulted in  laceration in the interdigital space of the left foot between the fourth  and fifth digits and she also fractured her toe.  She was seen last at  this clinic on July 10, 2009, by Dr. Jimmey Ralph who removed sutures that had  been in the area for over 2 weeks.  He thought that she would do well on  Neosporin ointment.  In the interim, the wound has deteriorated and  split apart further and has began draining a bloody material and this  seems to be uncomfortable to her.  She saw her primary care doctor, Dr.  Aldine Contes at the [facility] today  who requested urgent evaluation due to deterioration of the wound.  The  patient is already taking both Cleocin and Cipro for the wound.  She  thinks the culture may have been done at the [facility],  but she is not sure.   PHYSICAL EXAMINATION:  VITAL SIGNS:  Temperature 98, pulse 60,  respirations 16, and blood pressure 119/79.  EXTREMITIES:  Wound to the left foot fifth toe interdigital space  between the fourth and fifth toe is 0.4 cm long x 0.3 cm x 0.5 cm deep.  The wound is difficult to measure because of the interdigital space.  There was an excessive buildup of callus along the edges of wound.  There is excessive amount of moisture with blanching of the tissues deep  in the wound.  There is sanguinous material oozing from a portion of the  wound.  Skin is peeling back from the edges of the wound in certain  areas and in particular around where the suture puncture wounds were.   TREATMENT:  We sharply debrided this area under no anesthesia.  The  patient  tolerated the procedure well.  The wound was deteriorated as  compared to previous descriptions.  I removed callus, skin, and slough.  This was a selective debridement of less than 20 cm2 or less done with  forceps and scalpel and no bleeding was encountered.   Following the debridement, we applied Silvadene cream, 2 x 2 gauze  between the toes, and Kerlix wrapped the toes.  She is to wash her foot  with Dial soap, reapply Silvadene, gauze between the toes, and white  sock.  She will continue her Cleocin 150 mg 2 tablets 3 times daily and  Cipro 500 mg twice daily until all are gone.  She is to return to clinic  in 1 week for reevaluation.   ICD-9 998.30 disruption of wound.  CPT [id number] and 16109.      Lenon Curt Chilton Si, M.D.  Electronically Signed     AGG/MEDQ  D:  07/13/2009  T:  07/14/2009  Job:  [id number]   cc:   Mliss Sax, MD

## 2011-05-06 NOTE — Assessment & Plan Note (Signed)
[facility]   NAME:  Jennifer [name], Jennifer [name]               ACCOUNT NO.:  [id number]   MEDICAL RECORD NO.:  [id number]      DATE OF BIRTH:  12-May-1961   PHYSICIAN:  Jake Shark A. Tanda Rockers, M.D. VISIT DATE:  05/02/2008                                   OFFICE VISIT   SUBJECTIVE:  Ms. 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  In  the interim, we treated her with an off-loading healing sandal.  She has  sustained blunt trauma to the anterior shin of the left lower extremity,  which has been associated with a weeping scab and swelling.  There has  been no interim fever, malodorous drainage, or excessive pain.   OBJECTIVE:  Blood pressure is 134/85, respirations 74, temperature is  98.3, and capillary blood glucose is 169 mg%.  Inspection of the lower  extremity shows bilateral 2-3+ edema.  On the left lower extremity,  there is a halo of erythema associated with a soft eschar consistent  with the patient's history of recent trauma.  There is no ascending  cellulitis or lymphangitis on the volar aspect of the right foot.  The  previous ulcers completely resolved.  There is an area of thickened  eschar and callus; however, no paring was needed.   ASSESSMENT:  Resolved Wagner 1 diabetic foot ulcer, volar right foot  with a new posttraumatic stasis ulcer involving the left anterior shin.   RECOMMENDATIONS:  We will continue with the off-loading healing sandal  on the right foot while awaiting the receipt of custom shoes and  inserts.  On the left lower extremity, we will proceed with a Profore  wrap with triamcinolone ointment.  We will reevaluate the patient in one  week.      Harold A. Tanda Rockers, M.D.  Electronically Signed     HAN/MEDQ  D:  05/02/2008  T:  05/03/2008  Job:  [id number]

## 2011-05-06 NOTE — Assessment & Plan Note (Signed)
[facility]   NAME:  Jennifer [name], Jennifer [name]               ACCOUNT NO.:  [id number]   MEDICAL RECORD NO.:  [id number]      DATE OF BIRTH:  Jul 27, 1961   PHYSICIAN:  Jake Shark A. Tanda Rockers, M.D. VISIT DATE:  04/18/2008                                   OFFICE VISIT   SUBJECTIVE:  Ms. Eckert is a 50 year old lady who we followed for Wagner  I diabetic foot ulcer involving the right second metatarsal head on the  volar aspect of the foot.  Over the last week, we have treated her with  daily antibacterial soapy washes, a white sock, and also utilizing a  modified healing sandal.  She has also been on a course of antibiotics  by her referring physician, which she has completed.  There has been no  interim drainage, malodor, pain, or fever.  Her sugars have been under  reasonable control.  She was unable to proceed with the procurement of  custom orthotics for financial reasons.   OBJECTIVE:  VITAL SIGNS:  Her blood pressure is 120/70, respirations 16,  pulse rate 80, and temperature 98.3.  Capillary blood glucose is 239 mg  percent.  EXTREMITIES:  Inspection of the right plantar surface shows that the  ulcer has essentially re-epithelized.  There are many areas of  desquamation; no debridement is needed.  There is no local edema.  No  drainage.  No malodor.  No evidence of infection.  The pedal pulse  remains palpable.  There is a similar area on the left with increased  callus, no paring is indicated at present.  Both feet are well perfused.  There is trace edema bilaterally.   ASSESSMENT:  Near-resolution of Wagner I diabetic foot ulcer.   PLAN:  We will continue the patient's use of the antibacterial soap with  an offloading sandal.  We are referring her to the [facility] for  assistance in procuring in her shoes.  We will reevaluate her in 2 weeks  with anticipation that the wound will be completely resolved.  We have  given the patient opportunity to ask questions.  She  seems to understand  our impression and recommendations and indicates that she would be  compliant.      Harold A. Tanda Rockers, M.D.  Electronically Signed     HAN/MEDQ  D:  04/18/2008  T:  04/19/2008  Job:  [id number]

## 2011-05-06 NOTE — Assessment & Plan Note (Signed)
[facility]   NAME:  Jennifer [name], Jennifer [name]               ACCOUNT NO.:  [id number]   MEDICAL RECORD NO.:  [id number]      DATE OF BIRTH:  03/10/1961   PHYSICIAN:  Joanne Gavel, M.D.         VISIT DATE:  08/07/2009                                   OFFICE VISIT   HISTORY OF PRESENT ILLNESS:  This is a 50 year old female with diabetes  of more than 10 years.  She was insulin dependent.  She had developed  the laceration between the left fourth and fifth toe.  This has been  treated with antibiotic ointment and dry dressing.   PHYSICAL EXAMINATION:  Temperature 98.9, pulse 79, respirations 18,  blood pressure 120/80.  Her blood sugar is 309.  The laceration between  the toes is now completely healed.  She has, however, a sizeable callus  on the plantar surface of the toe and the foot.  There was no drainage.  There was no odor.   TREATMENT:  Using a scalpel blade, the callus was sharply debrided.  There was no bleeding.  Further treatment will include Allevyn pad for  some offloading.  This should be changed every other day and we will see  the patient in a month to determine whether or not more debridement is  necessary.      Joanne Gavel, M.D.  Electronically Signed     RA/MEDQ  D:  08/07/2009  T:  08/08/2009  Job:  [id number]

## 2011-05-06 NOTE — Procedures (Signed)
NAME:  Jennifer [name], Jennifer [name] NO.:  [id number]   MEDICAL RECORD NO.:  [id number]          PATIENT TYPE:  REC   LOCATION:  [facility]                          FACILITY:  [facility]   PHYSICIAN:  Erick Colace, M.D.DATE OF BIRTH:  [birth year]   DATE OF PROCEDURE:  DATE OF DISCHARGE:                               OPERATIVE REPORT   PROCEDURE:  Bilateral L5 dorsal ramus injection, bilateral L4 medial  branch block, bilateral L3 medial branch block under fluoroscopic  guidance.   INDICATIONS:  Lumbar facet syndrome.   Informed consent was obtained after describing risks and benefits to the  patient.  These include bleeding, bruising, infection, loss of bowel or  bladder function, temporary or permanent paralysis.  She elects to  proceed and has given written consent.  She rates her pain as 10/10  interfering with walking, bending and standing.   She elects to proceed and has given written consent.  She has been off  her Plavix for five days.   The patient placed prone on fluoroscopy table.  Betadine prep, sterile  drape. 25-gauge 1-1/2 inch needle was used to anesthetize skin and subcu  tissue 1% lidocaine x2 mL.  Then a 22 gauge 5 inch spinal needle was  inserted under fluoroscopic guidance first targeting the left S1 SAP  sacral ala junction, bone contact made confirmed with lateral imaging.  I then injected with 0.5 mL of a solution containing 1 mL of 4 mg/mL  dexamethasone 2 mL of 2% MPF lidocaine.  Then the left L5 SAP transverse  process junction targeted bone contact made confirmed with lateral  imaging.  Then 0.5 mL of the dexamethasone lidocaine solution was  injected.  Then the left L4 SAP transverse process junction was targeted  bone contact made confirmed with lateral imaging.  Then the 0.5 mL of  the dexamethasone lidocaine solution was injected.  The same procedure  was repeated on the right side at corresponding levels and same needle,  injectate and technique.   The patient tolerated procedure well.  Pre and  post injection vitals stable.  Post injection instructions given.  Pre  injection pain level 10/10, post injection 03/10.  Will return in one  month for confirmatory testing.      Erick Colace, M.D.  Electronically Signed     AEK/MEDQ  D:  02/14/2008 15:15:35  T:  02/15/2008 08:30:36  Job:  [id number]   cc:   Peggye Pitt, M.D.  Fax: [phone]

## 2011-05-06 NOTE — Assessment & Plan Note (Signed)
[facility]   NAME:  Jennifer [name], Jennifer [name]               ACCOUNT NO.:  [id number]   MEDICAL RECORD NO.:  [id number]      DATE OF BIRTH:  Feb 06, 1961   PHYSICIAN:  Jake Shark A. Tanda Rockers, M.D. VISIT DATE:  05/24/2008                                   OFFICE VISIT   SUBJECTIVE:  Jennifer [name] is a 50 year old lady who we were treating for  Wagner 1 diabetic foot ulcer involving the volar aspect of the right  foot and a posttraumatic stasis ulcer involving the medial aspect of the  left lower extremity.  In the interim, she has been wearing an off-load  heel and sandal and a compression wrap on the left lower extremity.  She  continues to be ambulatory.  There has been no pain or drainage.  She  has been approved for assistance from the [facility]  for 80% of the  cost of her custom orthotics and shoes.  She did not keep her  appointment with [employer]  for fitting.  She has not been able to raise  the additional 20% for her shoes.   OBJECTIVE:  VITAL SIGNS:  Blood pressure is 130/74, respirations 16,  pulse rate 58, and temperature 98.3.  Capillary blood glucose is 200 mg  percent.  EXTREMITIES:  Inspection of her lower extremity shows that there is 2+  edema.  The previous posttraumatic stasis ulcer on the left medial leg  is completely re-epithelized with a mature eschar.  There is no  drainage.  There is no tenderness or redness.  There is no evidence of  infection or ischemia in the left lower extremity.  On the right lower  extremity, the previous ulcer remains well healed.  There is beginning  reaccumulation of callus, but no paring nor debridement was needed.  There is no obvious drainage.  The  sandal is adequate for temporary off-  loading.   ASSESSMENT:  1. Resolved Wagner 1 diabetic foot ulcer, volar right foot.  2. Resolved stasis ulcer, medial left lower extremity.   PLAN:  We have given the patient a prescription for bilateral below the  knee open toe 30- to  40-mm compression hose.  We have also encouraged  her to continue to pursue and procure and wear the custom orthotics.  She understands that inadequate footware puts her at risk for  amputation.  We are discharging her to the care of Dr. Ardyth Harps at the  [facility].  Her wounds are resolved.   We have given the patient an opportunity to ask questions.  She seems to  understand.  She expresses gratitude for having been seen in the clinic.      Harold A. Tanda Rockers, M.D.  Electronically Signed     HAN/MEDQ  D:  05/24/2008  T:  05/25/2008  Job:  [id number]

## 2011-05-06 NOTE — Assessment & Plan Note (Signed)
[facility]   NAME:  Jennifer [name], SCHRAEDER               ACCOUNT NO.:  [id number]   MEDICAL RECORD NO.:  [id number]      DATE OF BIRTH:  [birth year]   PHYSICIAN:  Jake Shark A. Tanda Rockers, M.D. VISIT DATE:  04/11/2008                                   OFFICE VISIT   REASON FOR CONSULTATION:  Jennifer [name] is a 50 year old female who is  referred by Dr. Ardyth Harps for evaluation of an ulceration on the volar  aspect of her right lower extremity.   IMPRESSION:  Wagner 1 diabetic foot ulcer, right second met head.   RECOMMENDATIONS:  Continuation of daily antibacterial soapy washes with  a white cotton sock as a dressing and wearing of a modified healing  sandal to offload the second met head.  The patient is also to continue  her discharge medications from [facility].   SUBJECTIVE:  Jennifer [name] is a 50 year old lady who was seen at [facility]  approximately 12 days ago.  At that time, she had noted increasing  drainage from an ulcer on her right foot.  The ulcer has been present  for approximately 2 weeks, but she experienced increased drainage. some  malodor, and warmth.  There was no fever, however.  She was admitted to  the hospital and spent a total of 24 hours, at which time, she received  intravenous antibiotics and was discharged with instruction to be seen  in the wound center.  She was also seen by the orthopedist who evaluated  her and recommended that she be seen as an outpatient in the wound  center.   PAST MEDICAL HISTORY:  Remarkable for diabetic neuropathy, hypertension,  hyperlipidemia, glaucoma, rheumatoid arthritis, degenerative arthritis  of the spine, history of claudication, and Bell's palsy.   ALLERGIES:  She is allergic to MORPHINE which gives her hives and  contrast dye which resulted in acute renal failure.   CURRENT MEDICATIONS:  Include atenolol 100 mg daily, lisinopril 20 mg  daily, hydrochlorothiazide 25 mg daily, Lyrica 100 mg 3 times daily,  Advair Diskus 250/500 1 puff b.i.d., albuterol 90 mEq 1 puff q.6 h.,  NovoLog 70/30 120 units in the morning, 65 units in the evening,  Darvocet-N 100 1-2 every 4 hours p.r.n., Pravachol 20 mg nightly, Plavix  75 mg daily, and Lexapro 10 mg daily.   PREVIOUS SURGERIES:  As included, bilateral stents in the lower  extremities for occlusive vascular disease, she has had bilateral  lumpectomies of the breasts, ganglion cystectomy of the right wrist,  bilateral tubal ligations, and a hysterectomy.   FAMILY HISTORY:  Positive for diabetes, hypertension, cancer, and  stroke.   SOCIAL HISTORY:  She is divorced.  She has two adult children who live  in the local area.  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   REVIEW OF SYSTEMS:  The patient has extreme limitations and exercise  intolerance.  She cannot walk 4 blocks due to pain in her back related  to her degenerative disease.  She also has problems negotiating a flight  of stairs due to her chronic obstructive pulmonary disease.  She  continues to smoke cigarettes (a pack a day).  She has had significant  difficulty in trying to quit.  She denies hemoptysis.  She has had no  visual field losses or changes.  She has had no transient paralysis.  She specifically denies chest pain.  There are no bowel or bladder  complaints.  Her appetite is good.  She has gained 20-30 pounds over the  last year which she attributes to increased stress and stress-related  eating.  She has not been able to control her blood sugars  satisfactorily in part due to p.o. intake.  There is no heat or cold  intolerance.  There is no polydipsia or polyuria.  The remainder of the  review of systems are negative.   PHYSICAL EXAM:  GENERAL:  She is an alert, cooperative female in no  acute distress.  VITAL SIGNS:  Her blood pressure is 142/81, respirations 16, pulse rate  65, temperature is 98, capillary blood glucose is 258 mg percent.  HEENT:   Clear.  NECK:  Supple.  Trachea is midline.  Thyroid is not palpable.  LUNGS:  Clear.  COR:  Heart sounds are distant.  ABDOMEN:  Soft.  EXTREMITIES:  Remarkable for bilateral 1+ edema.  There is 2+ dorsalis  pedis pulse on the right, 1+ dorsalis pedis pulse on the left.  There is  no evidence of petechiae or acute vascular compromise.  Inspection of  the volar aspect of the right foot shows a prominent callus surrounding  a central area of ulceration.  The wound is 100% granulated.  There is  no excessive drainage or tunneling or undermining.  The callus is well-  adherent and has been previously pared and no further debridement or  paring is needed.  There is loss of protective sensation in the right  lower extremity as judged by the Semmes-Weinstein filament, and  protective sensation is preserved in the left lower extremity.  Segmental arterial Dopplers show normal ABIs of 0.93 and 1.10 in the  left and right respectively.   DISCUSSION:  Jennifer [name] is a 50 year old with multiple risk factors  leading to her ulceration.  She currently is insensate in the right foot  and is not wearing adequate foot protection.  Her sugars have according  to the patient been out of control, and she continues to use tobacco  products.  We have discussed each of these factors with the patient in  terms that she seems to understand.  We have recommended that we proceed  with modification of her healing sandal while concurrently ordering  custom orthotics and extra-depth shoes.  We have discussed the necessity  for routine and vigilant foot care.  She will continue on the  antibiotics which she was discharged on from the hospital.  She does not  remember the medication and her accompanying documents do not detail the  description.  There is no evidence of active infection at this point, we  feel that by addressing her insensitivity with adequate orthotics and  providing adequate serial debridements, that  this wound should heal.  We  have given the patient the opportunity to ask questions.  She seems to  understand and indicates that she will be compliant.  We will reevaluate  her in 1 week.      Harold A. Tanda Rockers, M.D.  Electronically Signed     HAN/MEDQ  D:  04/11/2008  T:  04/12/2008  Job:  [id number]   cc:   Peggye Pitt, M.D.

## 2011-05-06 NOTE — Assessment & Plan Note (Signed)
[facility]   NAME:  Jennifer [name], Jennifer [name]               ACCOUNT NO.:  [id number]   MEDICAL RECORD NO.:  [id number]      DATE OF BIRTH:  1961-01-17   PHYSICIAN:  Jake Shark A. Tanda Rockers, M.D. VISIT DATE:  05/09/2008                                   OFFICE VISIT   SUBJECTIVE:  Jennifer [name] is a 50 year old female who we are treating for  stasis ulcer.  She is also one-week following the closure of a Wagner I  diabetic foot ulcer on the volar right foot.  She continues to wear an  off-loading sandal and has been casted for custom orthotics.  She has  worn a compression wrap on the left lower extremity.  There has been no  excessive drainage, malodor, pain, or fever.   OBJECTIVE:  Blood pressure is 150/85, respirations 16, pulse rate 85,  and temperature 98.2.  Capillary blood glucose is 99 mg%.  Inspection of  the left lower extremity shows that the medial ulceration is clean with  an adequate compression.  There is angle of erythema, but there is no  drainage or malodor.  The pedal pulses remain palpable.   ASSESSMENT:  Clinical improvement of stasis ulcer, right lower  extremity.   PLAN:  We will return the patient to a compression wrap with  triamcinolone ointment.  We will reevaluate her weekly in the Nurses'  Clinic in 2 weeks by the wound care physician.      Harold A. Tanda Rockers, M.D.  Electronically Signed     HAN/MEDQ  D:  05/09/2008  T:  05/10/2008  Job:  [id number]

## 2011-05-06 NOTE — Procedures (Signed)
NAME:  Jennifer [name], Jennifer [name] NO.:  [id number]   MEDICAL RECORD NO.:  [id number]          PATIENT TYPE:  REC   LOCATION:  [facility]                          FACILITY:  [facility]   PHYSICIAN:  Erick Colace, M.D.DATE OF BIRTH:  03-05-1961   DATE OF PROCEDURE:  03/20/2008  DATE OF DISCHARGE:                               OPERATIVE REPORT   PROCEDURE:  Right sacroiliac injection under fluoroscopic guidance.   INDICATIONS:  Axial back pain, right greater than left.  She has had a  relief of low back pain after facet injections which went from a 10 to a  3 postprocedure, however, she states that she did not feel it was  successful and would like to try something else.   She has been off for Plavix for 5 days.   Informed consent was obtained after describing risks and benefits of the  procedure to the patient.  These include bleeding, bruising, infection,  temporary or permanent paralysis.  She elects to proceed and has given  written consent.  The patient placed prone on fluoroscopy table.  Betadine prep, sterile drape, a 25-gauge inch and a half needle was used  to anesthetize skin and subcu tissue 1% lidocaine x2 mL and a 25-gauge  three and a half inch spinal needle was inserted under fluoroscopic  guidance into the right SI joint.  AP, lateral and oblique imaging  utilized.  Omnipaque 180 demonstrated good joint uptake, no  intravascular uptake, followed by injection of 0.5 mL of 40 mL/mL Depo-  Medrol and 1 mL of 2% MPF lidocaine.  The patient tolerated procedure  well.  Pre- and post injection vitals stable.  The patient to follow up  with Dr. Ardyth Harps.  Pre-  and post injection pain level is 10/10.  Thus  far, it appears that the facet injection was more helpful, however, the  patient did not feel it was.  She will follow up with Dr. Ardyth Harps to  over other treatment options.  No other injections recommended other  than perhaps repeating medial branch blocks.      Erick Colace, M.D.  Electronically Signed     AEK/MEDQ  D:  03/20/2008 15:21:40  T:  03/20/2008 20:13:06  Job:  [id number]   cc:   Peggye Pitt, M.D.  Fax: [phone]

## 2011-05-06 NOTE — Consult Note (Signed)
NAME:  [name], [name] NO.:  [id number]   MEDICAL RECORD NO.:  [id number]         PATIENT TYPE:  HREC   LOCATION:                               FACILITY:  [facility]   PHYSICIAN:  Ardath Sax, M.D.     DATE OF BIRTH:  October 05, 1961   DATE OF CONSULTATION:  DATE OF DISCHARGE:                                 CONSULTATION   Jennifer [name] is a 50 year old lady who cut her left foot right between  the 4th and 5th toes on a bedpost and she went to an urgent care, and  they put her in sutures about 2 weeks ago.  The wound was separated.  I  took out the sutures, scrubbed it with some Betadine and then I put in  some Bactroban ointment and told her to change the dressing every day;  in fact I told her when she is around the house just to wear a cotton  sock and keep the neosporin.  This wound is only about 2 mm deep and 2  mm wide and about a centimeter and a half long, and I think it will heal  nicely with just cleansing it and putting on neosporin ointment.      Ardath Sax, M.D.     PP/MEDQ  D:  07/10/2009  T:  07/11/2009  Job:  [id number]

## 2011-05-06 NOTE — Discharge Summary (Signed)
NAME:  Jennifer [name], Jennifer [name] NO.:  [id number]   MEDICAL RECORD NO.:  [id number]          PATIENT TYPE:  INP   LOCATION:  5127                         FACILITY:  [facility]   PHYSICIAN:  Tacey Ruiz, MD    DATE OF BIRTH:  1961-10-11   DATE OF ADMISSION:  03/30/2008  DATE OF DISCHARGE:  03/31/2008                               DISCHARGE SUMMARY   DISCHARGE DIAGNOSES:  1. Right diabetic foot ulcer, status post debridement.  2. Diabetes.  3. Hypertension.  4. Hyperlipidemia.  5. Peripheral vascular disease.  6. Lumbar disk disease.  7. Lasegue syndrome.  8. History of rectal abscess.  9. History of Bell's palsy.  10.Chronic obstructive pulmonary disease.  11.Glaucoma.  12.Tobacco abuse.  13.Depression.   DISCHARGE MEDICATIONS:  1. Atenolol 100 mg daily.  2. Lisinopril 20 mg daily.  3. Hydrochlorothiazide 25 mg daily.  4. Lyrica 100 mg t.i.d.  5. Advair 250/50 one puff b.i.d.  6. Albuterol one puff.  7. NovoLog 70/30, 120 units q.a.m.  8. NovoLog 70/30, 60 units q.p.m.  9. Naprosyn 500 mg 2 times daily.  10.Tylenol 500 mg as needed.  11.Ibuprofen 600 mg as needed.  12.Pravachol 20 mg at bedtime.  13.Chantix daily.  14.Plavix 75 mg daily.  15.Vicodin 5/500 q.4 hours p.r.n. pain x1 week.  Do not take with      Tylenol.  16.Bactrim double strength one tablet 2 times per day x9 days.  17.Triple antibiotic ointment, apply to foot daily.   CONSULTS:  Orthopedics seen by Dr. Rennis Chris.   DISPOSITION:  The patient will follow up with [facility] with Dr. Ardyth Harps, [phone].  The patient will need to call for  appointment in approximately one week.  The patient is being, however,  worked up for April 10, 2008, and I again discussed this further with  Dr. Ardyth Harps.  It is important that the patient stays off the foot at  least in the short term, as the wound continues to heal after being  debrided; however, there was no fear that this wound was  infiltrating  deeply under the deep tissue or bone.  The patient will continue on  antibiotic for a total of 10 days.  The patient also will be referred to  the Wound Center at the [facility] at discharge.   STUDIES:  X-ray of the right foot on March 30, 2008 showed negative for  osteomyelitis or other acute bony abnormality.   CULTURES:  The wound culture, which was done in the hospital on March 30, 2008, also needs to be followed in the outpatient setting.   Consults, orthopedics, seen by Dr. Rennis Chris.   HOSPITAL COURSE AND BRIEF H&P:  Jennifer [name] is a 50 year old African-  American female with past medical history significant for diabetes,  hypertension, and peripheral vascular disease, who presents to the  outpatient clinic with 3-week history of a right foot ulcer that started  as a black spot and has gradually increased in size.  For the last 4-5  days, she has been having a yellowish discharge from her right  foot  wound and a low-grade fever with chills.   ALLERGIES:  The patient is allergic to MORPHINE, which caused a rash   PAST MEDICAL HISTORY:  As dictated above.   DISCHARGE MEDICATIONS:  Same as above except for Vicodin, Bactrim, and  antibiotic ointment.   SOCIAL HISTORY:  The patient is a current smoker of 1 pack per day for  some 39 years.  Denies any alcohol use or drug use.  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   REVIEW OF SYSTEMS:  Positive for fevers, chills, fatigue, nausea,  dizziness, or depression.   PHYSICAL EXAMINATION:  VITAL SIGNS:  On admission, temperature 97.0,  blood pressure 110/70, pulse 60, and respiratory rate 16.  GENERAL:  The patient is awake and alert, but obese.  EYES:  Pupils equal and reactive to light and accommodation.  Extraocular movements intact.  ENT:  Oropharynx clear.  NECK:  Supple. No JVD.  RESPIRATORY:  Clear to auscultation bilaterally.  CARDIOVASCULAR:  Normal S1, S2.  Regular rate and  rhythm.  GI:  Soft, nontender.  Positive bowel sounds. No distention.  EXTREMITIES:  The right plantar foot ulcer is approximately 1 x 1 cm in  diameter with yellowish discharge emanating from the wound.  There is  some erythema in that there are calluses around the wound extending  approximately 3 cm in diameter.  There are decreased dorsalis pulses in  both feet, but it is decreased on the left as opposed to the right.  SKIN:  Normal.  NEUROLOGIC:  Cranial nerves II through XII intact.  Motor power is 5/5  in the upper and lower extremities.  Sensation is intact.  Cerebellar  function is intact.  The patient is alert and oriented x3.   LABS ON ADMISSION:  CBC; white count 11.3, hemoglobin 15.2, and  platelets of 169.  Electrolytes; sodium 138, potassium 3.7, chloride  102, bicarbonate 25, glucose 79, BUN 16, creatinine 0.6, total bilirubin  0.5, alkaline phosphatase 92, AST 28, ALT 36, total protein 7.0, albumin  3.6, and calcium of 9.6.  Sed rate was checked and it was 33.  Influenza  swab was checked and it was negative.  Lipid profile showed a  cholesterol of 167, triglyceride 138, HDL 31, and LDL 108.   BRIEF HOSPITAL COURSE:  1. Right diabetic plantar foot ulcer, which is probably 1 cm in      diameter with pink granulation tissue visible at the bottom.  Foot      x-ray was obtained to look for osteomyelitis and was negative per x-      ray.  Orthopedic Surgery was consulted as an outpatient, and Dr.      Rennis Chris evaluated the wound and debrided it.  He felt that there was      good granulation tissue deep, and per his exam, felt that it was      unlikely that there is any deeper infection or osteomyelitis.  We      placed the patient on Bactrim double strength one tablet twice a      day and obtained this medication for 10 days.  Dr. Rennis Chris is in      agreement with this medication.  After I&D, the patient was seen by      Wound Care, who evaluated the wound and felt that there  were no      further recommendations other than antibiotic cream applied to he  wound daily.  The patient will have a referral to the Outpatient      Wound Care Center at [facility], and the patient will be      given a cane and have an extra depth shoes recommended.  The      patient was discharged home on March 31, 2008.  White count had      improved then to 9.7, and the patient remained afebrile throughout.  2. Hypertension.  The patient retained her home medications including      atenolol, ACE inhibitors, and hydrochlorothiazide.  Blood pressure      remained under good control throughout the hospital course.  3. Flu-like symptoms.  These seemed to be shelving spontaneously prior      to hospital admission.  It was unclear whether they are related to      the foot ulcer or not.  They seem more likely to be viral.  For      growth assay, Influenza swab was checked and was negative.  The      patient suffered from no complaint of these symptoms during the      hospital course.  4. COPD.  The patient will continue on her home medications, Advair      and Spiriva, and this was not a problem during the hospital course.  5. Diabetes.  The patient was sent on a home dose of 70/30 as well as      sliding scale insulin.  Glucose remained under good control.  6. Coronary artery disease.  The patient's Plavix was held until Ortho      saw the patient debrided.  This medication should get a restart      upon hospital discharge on March 31, 2008, as there is likelihood      of any bleeding related to debridement.   DISCHARGE LABS AND VITALS:  Temperature afebrile since admission, blood  pressure 117/72, pulse 61, respiratory rate 20, and O2 sat 98% on room,  air.  Labs:  CBC; white count 9.7, hemoglobin 14.3, and platelets 161.  Electrolytes; sodium 138, potassium 3.7, chloride 102, bicarbonate 25,  BUN 16, creatinine 0.6, and glucose 79.      Tacey Ruiz, MD   Electronically Signed     JP/MEDQ  D:  03/31/2008  T:  04/01/2008  Job:  [id number]   cc:   Peggye Pitt, M.D.

## 2011-05-06 NOTE — Op Note (Signed)
NAME:  Jennifer [name], Jennifer [name] NO.:  [id number]   MEDICAL RECORD NO.:  [id number]          PATIENT TYPE:  OBV   LOCATION:  2027                         FACILITY:  [facility]   PHYSICIAN:  Janetta Hora. Fields, MD  DATE OF BIRTH:  20-Apr-1961   DATE OF PROCEDURE:  10/22/2007  DATE OF DISCHARGE:  10/23/2007                               OPERATIVE REPORT   PROCEDURE:  1. Aortogram with bilateral lower extremity runoff.  2. Primary stenting of right common iliac artery.  3. Angioplasty of left common iliac artery.   ASSISTANT:  Veverly Fells. Excell Seltzer, MD   INDICATIONS:  The patient is a 50 year old female with previous left  iliac stenting approximately five years ago by Dr. Miles Costain in radiology.  Over the last six months, she has developed progressive claudication  symptoms in her right lower extremity.  ABIs on the right side were 0.7.   OPERATIVE DETAILS:  After obtaining informed consent, the patient was  brought to the [facility] lab.  The patient was placed in supine position on the  angio table.  Both groins were prepped and draped in the usual sterile  fashion.  Local anesthesia was infiltrated over the left common femoral  artery.  A Majestic needle was used to cannulate the left common femoral  artery without difficulty.  A 0.035 J-wire was then advanced up into the  abdominal aorta under fluoroscopic guidance.  Abdominal aortogram was  obtained.  Left and right renal arteries are widely patent.  The lower  abdominal aorta is normal in appearance.  There is a left common iliac  artery stent which extends into the abdominal aorta and the struts of  the stent actually cross over the full width of the aorta and are  adjacent to the contralateral side.  This causes some compression and  obstruction of the right common iliac artery origin.  The left common  iliac artery stent extends down into the left external iliac artery.  Left and right external and internal iliac arteries are patent.   Pigtail  catheter was then pulled down near the aortic bifurcation and magnified  views were obtained to further define the obstruction of the right  common iliac artery.  Next a crossover catheter was brought on the  operative field.  Pigtail catheter was removed over a guidewire.  The  crossover catheter brought up on the operative field and placed over the  guidewire to selectively catheterize the right common iliac artery.  Initially a Sos Omni catheter and a Motarjeme catheter were used to try  to selectively catheterize the right common iliac artery.  These  attempts were unsuccessful due to the tips of the catheter continuing to  catch on struts of the stent and then they would not go around the outer  aspect of the stent to cannulate the right common iliac artery.  The  crossover catheter was successful.  Then I was able to advance a 0.035  Wholey wire into the right common femoral artery.  The crossover  catheter was then removed and a 4-French end-hole catheter placed over  the guidewire  down into the distal external iliac artery.  The right  lower extremity runoff view was obtained.  This shows that the right  common femoral, profunda femoris, superficial femoral, popliteal and all  three tibial vessels are widely patent in the right lower extremity.  The end-hole catheter was then pulled back and it was decided at this  point that the patient's primary problem with claudication and decreased  ABI on the right side was due to compression of the right common iliac  artery compromised from the left common iliac artery stent.  It was  decided that the best option to treat this would be a retrograde  puncture and primary stenting of the right common iliac artery to raise  the aortic bifurcation.  In light of this, the right common femoral  artery was cannulated after administration of local anesthesia.  A 0.035  Wholey wire was then advanced into the right common iliac artery  system.  A 5-French sheath was placed over the guidewire in the right common  iliac artery.  Attempts were then made using an H1 catheter and a 5-  French end-hole catheter to advance the guidewire around the outer  aspect of the preexisting left common iliac artery stent.  After several  attempts these were also unsuccessful.  I was able to pass the guidewire  through the struts of the stents several times but not around the outer  aspect of the stent.  Therefore, a 0.035 Glidewire was brought to the  operative field.  I was able to advance this into the abdominal aorta.  However, after crossing the aortic bifurcation, the guidewire would not  advance easily.  Contrast angiogram was obtained which showed that the  right common iliac artery was occluded at this point and most likely  from a small area of dissection from the Glidewire.  Therefore the  catheter and Glidewire were removed.  I then asked Dr. Tonny Bollman  from [facility] to assist in getting the Glidewire back up and  around the stent.  He was able to successfully manipulate the Glidewire  around the edge of the stent and up into the native aorta in a true  lumen plane with no evidence of dissection.  A pigtail catheter was then  placed over this Glidewire up into the abdominal aorta.  Contrast  angiogram was obtained which again showed a narrowing of the right  common iliac artery system but that the native external iliac artery  system was patent and there was no dissection.  Next the 5-French  sheaths on the left and right common femoral artery were both exchanged  and upsized for 7-French sheaths.  The patient was also given 7000 units  of intravenous heparin.  An 8 x 40 balloon expandable stent was then  brought up in the operative field and placed in position with its top  portion adjacent to the top portion of the left common iliac artery  stent.  An 8 mm balloon was also placed in the left common iliac  artery  system and using a kissing balloon technique, the right common iliac  artery stent was deployed.  Completion arteriogram showed widely patent  right and left common iliac arteries with no residual stenosis.  A right  lower extremity runoff had already been performed, a left lower  extremity runoff was performed through the sheath and this also showed  widely patent left common femoral, superficial femoral, popliteal and  posterior and tibial vessels, all three  tibial vessels patent.  At this  point all guidewires and catheters were removed.  The 7-French sheaths  were left in place in both groins until the ACT was less than 175.  The  patient tolerated procedure well and there were no complications.   OPERATIVE FINDINGS:  1. Narrowing of right common iliac artery from previously placed left      common iliac artery stent.  2. Primary stenting of right common iliac artery with a kissing      balloon technique to buttress left common iliac artery stent.  3. No significant stenosis in lower extremity vessels from the common      femoral down through the tibial vessels.  4. 8 x 40 stent right common iliac artery.      Janetta Hora. Fields, MD  Electronically Signed     CEF/MEDQ  D:  10/24/2007  T:  10/25/2007  Job:  [id number]   cc:   Veverly Fells. Excell Seltzer, MD

## 2011-05-06 NOTE — Procedures (Signed)
VASCULAR LAB EXAM   INDICATION:  Followup, bilateral iliac stents.   HISTORY:  Diabetes:  Yes.  Cardiac:  No.  Hypertension:  Yes.   EXAM:   IMPRESSION:  Patent bilateral iliac stents with broadened biphasic  waveforms.  A technically difficult study due to the patient's body  habitus.   ___________________________________________  Janetta Hora. Fields, MD   OD/MEDQ  D:  11/26/2010  T:  11/26/2010  Job:  [id number]

## 2011-05-06 NOTE — Assessment & Plan Note (Signed)
[facility]   NAME:  Jennifer [name], Jennifer [name]               ACCOUNT NO.:  [id number]   MEDICAL RECORD NO.:  [id number]      DATE OF BIRTH:  1961/03/05   PHYSICIAN:  Leonie Man, M.D.    VISIT DATE:  07/10/2009                                   OFFICE VISIT   PROBLEM:  This is a 50 year old female with an interdigital laceration  between the fourth and fifth toes which is now persistent and  nonhealing.  She was being treated with Silvadene and dry dressings to  this area.  The wound has not shown any significant healing over the  past 2 weeks.  She is here again for evaluation.   PHYSICAL EXAMINATION:  Blood pressure is 127/71, pulse is 81, and  respirations 19.   There is some mild maceration in the web space.  There is no drainage or  odor from this wound.  The wound is mildly tender on palpation.   I used mupirocin and I put this directly into the wound.  I put dry  dressing gauze between toes with a tape to secure this in place and the  patient is given a prescription for mupirocin and asked to change the  dressing every 2 days.  We will follow up with her in 1 week.      Leonie Man, M.D.  Electronically Signed     PB/MEDQ  D:  07/17/2009  T:  07/18/2009  Job:  [id number]

## 2011-05-06 NOTE — Assessment & Plan Note (Signed)
[facility]   NAME:  Jennifer [name], Jennifer [name]               ACCOUNT NO.:  [id number]   MEDICAL RECORD NO.:  [id number]      DATE OF BIRTH:  13-Jul-1961   PHYSICIAN:  Leonie Man, M.D.         VISIT DATE:                                   OFFICE VISIT   PROBLEM:  Nonhealing wound in the interdigital spaces of the left foot  between digits 4 and 5.   The patient obtained this wound after some trauma of hitting her toe  against a bedpost.  She has been treated most recently with mupirocin  and dry gauze dressings after daily cleansing.  The patient did not  obtain the mupirocin ordered and instead has been using some triple  antibiotic ointment to treat this area.   EXAMINATION:  GENERAL:  The patient is in no acute distress.  VITAL SIGNS:  Temperature 98.3, pulse 69, respirations 20, blood  pressure 116/70, and capillary blood glucose 199.  EXTREMITIES:  On examination, the split is healing quite nicely.  There  is still a very small open area towards the plantar surface of the toe.  There is no drainage or odor.   PLAN:  Continue mupirocin with the dry, sterile dressings.  Followup  visit in 2 weeks for this patient.      Leonie Man, M.D.  Electronically Signed     PB/MEDQ  D:  07/24/2009  T:  07/25/2009  Job:  [id number]

## 2011-05-06 NOTE — Group Therapy Note (Signed)
CONSULT REQUESTED BY:  Dr. Lowella Bandy   Jennifer [name] is a 50 year old female with low back pain for about a year.  She states that the pain is averaging 10/10, currently 9/10 and  described as sharp, burring and stabbing, pulling.  She states that it  is hard for her to stand for more than 15 minutes at a time.  She has  had additional workup including lumbosacral spine films April 19, 2007  showing lower facet arthropathy.  Also showing moderate facet joint  arthritis L5-S1 on MRI lumbosacral spine May 31, 2007.  Did not show any  significant diskogenic disease.  Patient's pain is worse with bending  backwards and improves with bending forward in fact.  At work she has  to sometimes take break where she bends forward.   PAST MEDICAL HISTORY:  Significant for bilateral iliac stent placement.  She had her last stent placed about four months ago.  She has had good  relief of her lower extremity pain and really her back pain is what is  bothering her most now.  Her other mediations include ibuprofen and  Naprosyn, which are not particularly helpful.  She is allergic to  morphine.   Her sleep is poor.  Pain is worse with walking, bending, standing.  Improves with rest.  As noted above, leaning forward helps.  She has  some numbness, tremor, tingling in the hip and back area.  Anxiety, but  negative for depression.  She has some diarrhea, some blood sugar  regulation problems due to her diabetes.   Other past medical history is significant for glaucoma.   SOCIAL HISTORY:  She is divorced.  She smokes and is on Chantix.   FAMILY HISTORY:  Heart disease, diabetes, high blood pressure, lung  disease.   GENERAL:  No acute distress.  Mood and affect appropriate.  BACK:  Has tenderness to palpation in the lumbosacral parispinals.  Pain  also in the PSIS area.  Faber's maneuver causes some pain in the low  back area bilaterally.  No pain in the hip area.  No pain over the  trochanteric  bursa.  She has normal neck range of motion.  Her lumbar  spine range of motion is full in forward flexion, but only 50%  extension.  She has normal strength in the deltoid, biceps, triceps,  grip as well as hip flexors, knee extensor and dorsiflexor.  Sensation  is normal in bilateral upper and lower extremities to pin prick.  Deep  tendon reflexes are 1+ bilateral biceps, triceps, brachioradialis, knee  and ankle.   Straight leg raising test is negative.   IMPRESSION:  Lumbar facet arthropathy as noted on MRI as well as plain  films.  There is a poor correlation with pain and radiographic  abnormalities.  Therefore, to clarify would need to do some medial  branch blocks to see if this helps the patient's symptomatology.   In order to do these, she needs to be off her Plavix for five days as  well as off her Advil and Naprosyn.  We will need to get the okay from  Dr. Darrick Penna to take her off the Plavix, given her fairly recent stenting.   If we are able to take her off her Plavix for five days, I will see her  back for bilevel L5 dorsal ramus injection, bilateral L4 medial branch  block and bilateral L3 medial branch block under fluoroscopic guidance.   Thank you for this interesting consultation.  Erick Colace, M.D.  Electronically Signed     AEK/MedQ  D:  02/07/2008 13:04:15  T:  02/07/2008 21:44:16  Job #:  [id number]   cc:   C. Ulyess Mort, M.D.  Fax: [phone]   Peggye Pitt, M.D.  Fax: [phone]   Janetta Hora. Fields, MD  [address] [address], [place] [address]

## 2011-05-06 NOTE — Assessment & Plan Note (Signed)
OFFICE VISIT   Jennifer [name], FREEZE  DOB:  11-06-1961                                       10/20/2007  IONGE#:[id number]   The patient is a 50 year old female who complains of bilateral lower  extremity pain.  Her symptoms are claudication in nature.  Right leg is  worse than the left.  This occurs at approximately 20 yards.  She had a  left common iliac and external iliac artery stent placed by Dr. Miles Costain  with radiology at [facility] in 2005.  She stated that both of her symptoms in  both legs completely resolved at that time.  Over the last 6 months, she  has developed progressive symptoms in both lower extremities.  She also  has pain in her feet bilaterally from neuropathy.  She has no wounds in  her feet.  She currently smokes a pack of cigarettes a day.  She also  has a history of diabetes and elevated cholesterol.  She states that she  has tried to quit smoking multiple times, but has been unsuccessful.  She also has a history of hypertension and elevated cholesterol.  She  had a myocardial infarction in 2006.   PAST MEDICAL HISTORY:  Also significant for COPD, neuropathy, sinus  problems, arthritis including rheumatoid arthritis, glaucoma, asthma,  and recent episode of Bell's palsy.   PAST SURGICAL HISTORY:  She had a right rotator cuff repair, bilateral  tubal ligation, hysterectomy, bladder tacking, breast biopsy, and  removal of a right wrist cyst.   FAMILY HISTORY:  Remarkable for her mother and sisters having vascular  disease at ages less than 88.   SOCIAL HISTORY:  She is single, has 2 children.  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  Smoking history is as above.  She does not  drink alcohol regularly.   REVIEW OF SYSTEMS:  She is 5 feet 6 inches, 298 pounds.  She denies  recent weight loss or gain.  She has no chest pain or cardiac arrhythmia.  PULMONARY:  She has a history of bronchitis and COPD.  GASTROINTESTINAL:  She denies  any history of GI bleeding, but has had  some intermittent problems with diarrhea.  GENITOURINARY:  She has no history of renal insufficiency, but states  she did have an episode of contrast nephropathy after a CT scan  previously.  Her kidneys recovered after this.  VASCULAR:  She has pain in her legs when walking, as described above.  She thinks she may have had a mini-stroke with some slurred speech 1  month ago.  She had some drooping of the right side of her mouth as  well, but then was diagnosed with Bell's palsy.  An MRI scan at that  time showed no evidence of stroke.  NEUROLOGIC:  She denies history of black outs, headaches, or seizures.  ORTHOPEDIC:  She has multiple arthritis, joint, and muscle pains,  primarily in her back and legs.  PSYCHIATRIC:  She has mild depression.  HENT:  She denies recent changes in her sight or hearing.  HEMATOLOGICAL:  She has no history of hypercoagulable state or bleeding  disorder.   MEDICATIONS:  Atenolol 100 mg once a day.  Lisinopril 20 mg once a day.  Hydrochlorothiazide 25 mg once a day.  Lyrica 100 mg 3 times daily.  Advair 1 puff 2 times a day.  Albuterol p.r.n.  NovoLog 70/30 100 units  in the morning and 50 units at night.  Naprosyn 500 mg 2 times a day.  Flexeril 10 mg nightly.  Extra Strength Tylenol p.r.n.  Ibuprofen 600 mg  every 8 hours for pain.  She was recently on prednisone, but stopped  this a few weeks ago as treatment for her Bell's palsy.   She is allergic to morphine, which causes a rash.  She has a side effect  from contrast of apparently a contrast nephropathy episode where her  creatinine went from 0.7 to greater than 2 briefly, and this was thought  to be secondary to the contrast.  This was after a CT scan.  She  apparently had no evaluation of her creatinine after her previous  arteriogram.   PHYSICAL EXAM:  Blood pressure 114/67, pulse 52 and regular.  HEENT is  unremarkable.  Neck has 2+ carotid pulses without  bruit.  Chest is clear  to auscultation.  Cardiac exam is regular rate and rhythm.  Abdomen is  very obese, soft, and nontender, nondistended.  No masses palpable.  She  has 1+ radial pulses bilaterally.  She has a 1+ right femoral pulse, 2+  left femoral pulse.  She has absent popliteal and pedal pulses on the  right side.  She has a 2+ left popliteal pulse and a 1+ left dorsalis  pedis pulse.  Both extremities have diffuse varicosities.  The right  calf and thigh have obvious large varicosities.  The left calf and thigh  are slightly less prominent.  She has no ulcerations on her feet and no  edema.   I reviewed her ABI from [facility] dated September 23.  This showed an  ABI on the right side of 0.74, which reduced to 0.55 with exercise, and  on the left a 1.0, which reduced to 0.91 with exercise.  Of note, her  most recent creatinine on October 8 was 0.7.   I had a lengthy discussion with the patient today explaining to her that  her symptoms are probably multifactorial with arthritis, neuropathy, and  claudication.  I explained to her the extremely high risk of limb loss  if she continues to smoke in the presence of diabetes.  I also explained  to her that she is currently not at risk of limb loss, but she states  that she cannot go on any further without some sort of intervention to  improve the circulation to her legs and improve her symptoms.  I also  explained to her that the procedure could injure her kidneys and she  could end up on permanent dialysis.  She stated that she was okay with  this.  I also explained to her that she is currently not at risk of limb  loss, but with multiple interventions, she could increase her risk of  limb loss in the near future.  Again, she decided that she wanted to  proceed.  We have scheduled an arteriogram for her for October 31.  Procedure details, risks, benefits, and possible complications were  explained to the patient.  She agrees to  proceed.   Janetta Hora. Fields, MD  Electronically Signed   CEF/MEDQ  D:  10/21/2007  T:  10/21/2007  Job:  [id number]   cc:   Peggye Pitt, M.D.

## 2011-05-08 ENCOUNTER — Ambulatory Visit: Payer: Medicare Other | Admitting: Physical Therapy

## 2011-05-09 NOTE — Discharge Summary (Signed)
NAME:  Jennifer [name], [name] NO.:  [id number]   MEDICAL RECORD NO.:  [id number]          PATIENT TYPE:  INP   LOCATION:  6729                         FACILITY:  [facility]   PHYSICIAN:  Alvester Morin, M.D.  DATE OF BIRTH:  Dec 02, 1961   DATE OF ADMISSION:  07/08/2005  DATE OF DISCHARGE:  07/10/2005                                 DISCHARGE SUMMARY   DISCHARGE DIAGNOSES:  1.  Diarrhea secondary to Giardia infection - resolving.  2.  Hypokalemia.  3.  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  4.  Hypertension - stable.  5.  Hyperlipidemia - stable.  6.  Chronic obstructive pulmonary disease - stable.  7.  Peripheral vascular disease - stable.  8.  Depression - stable.  9.  Obesity - stable.  10. Tobacco abuse.   DISCHARGE MEDICATIONS:  1.  Metronidazole 250 mg t.i.d. x5 more days.  2.  K-Dur 40 mEq p.o. b.i.d.  3.  Spiriva one capsule inhaled daily.  4.  Flovent 44 mcg one inhalation per day.  5.  Neurontin 600 mg t.i.d.  6.  Lexapro 10 mg daily.  7.  Aspirin 81 mg daily.  8.  Insulin 70/30, 100 units subcu q.a.m. and 40 units subcu q.p.m.   MEDICATIONS ON HOLD:  1.  Atenolol 100 mg daily.  2.  Lisinopril 40 mg daily.  3.  Norvasc 5 mg daily.   CONDITION AT DISCHARGE:  Stable with her diarrhea resolving.  The patient  was to follow up with a stat BMET in the Outpatient Clinic on July 12, 2005,  at 2 p.m. with Dr. Ardyth Harps to follow up on her hypokalemia.   PROCEDURES:  None.   CONSULTANTS:  None.   HISTORY AND PHYSICAL WITH ADMISSION LABS:  Please see the full H&P in the  chart for details.  In summary, Jennifer [name] [name] is a 50 year old female, who has  a history of diabetes mellitus, increased liver enzymes, and who recently  finished a course of antibiotics for one month, presented to the Outpatient  Clinic with a 16-day complaint of diarrhea.  Jennifer [name] [name] states that on June 22, 2005, she was eating candy and suddenly  became incontinent of stool.  She  has had gradually increasing numbers of stools since that day, and yesterday  reports having 50 to 60 bowel movements.  There is never any blood.  It  started off yellowish and was greenish this morning for the first time.  The  diarrhea is not associated with any particular food or drink.  Before she  was sick with diarrhea, the patient's normal routine was four bowel  movements per day that were brown and formed.  She is also complaining of  abdominal pain x1 week around the umbilicus.  She describes it as throbbing,  intermittent, and moves across her belly.  The pain is associated with  nausea, but she denies vomiting.  The pain is 10 out of 10 at worst.  Not  alleviated by bowel movements.  The patient also denies fevers  but admits to  chills and subjective weight loss at 18 pounds.  The patient tried Imodium  and Pepto-Bismol without relief.  Her blood sugars have been in the range of  160 to 190 prior to admission, and the patient has not used her insulin  since Saturday, approximately four days prior to admission.  The patient is  also complaining of some vaginal irritation and thinks she has a yeast  infection.   PHYSICAL EXAMINATION ON ADMISSION:  VITAL SIGNS:  She had a pulse of 98, her  respiration rate was 14, her temperature was 98 degrees, her blood pressure  standing up was 135/90 and lying down was 138/95.  GENERAL:  She was awake, alert, oriented x3, and in no acute distress except  for getting up every couple of minutes to use the restroom to have a bowel  movement.  HEENT:  Her extraocular muscles were intact, her mucous membranes were  moist, and she was anicteric.  LUNGS:  Bilateral expiratory wheezes.  HEART:  Regular in rate and rhythm, no murmurs.  ABDOMEN:  Soft, nondistended, obese, she had decreased bowel sounds, and  mild left upper quadrant pain.  EXTREMITIES:  No clubbing, cyanosis, or edema.  SKIN:  Warm and dry.   LYMPHS:  She had no lymphadenopathy.  NEURO:  Grossly nonfocal.  PSYCH:  Appropriate.   ADMISSION LABORATORY DATA:  Her sodium was 140, her potassium was 3.4,  chloride 105, bicarb 25, BUN 8, creatinine 0.7, glucose 158.  WBCs 11.8,  hemoglobin 15.6, platelets 239, ANC 6.5, MCV 96.  Her alk-phos was slightly  elevated at 115, AST 29, ALT 41, albumin 3.6.  Her hemoglobin A1c was 9 at  last check, and her CBG in the Outpatient Clinic on the day of admission was  178.   HOSPITAL COURSE:  1.  Diarrhea secondary to Giardia infection - Jennifer [name] [name] continued to have      diarrhea throughout her admission and while we were working up the      source for her diarrhea. The patient's labs stayed stable except for she      had a mild hypokalemia.  She was not orthostatic, and her renal function      remained normal.  We kept the patient on IV fluids for about 24 hours      and the patient was given a clear liquid diet to start and was slowly      advanced in her diet before she was discharged home.  She never had any      nausea or vomiting while she was in the hospital, and her main complaint      during her entire admission was the diarrhea.  Once we found the source      of her infection, we treated her appropriately with Metronidazole, and      she was sent home with a five-day supply of Metronidazole 250 mg, to be      taken t.i.d., by the hospital.  2.  Hypokalemia - this is most likely secondary to number one and should      resolve once the diarrhea resolves.  We sent the patient home on 40 mEq      of K-Dur b.i.d., and she is to have a stat BMET when she returns to the      office for her hospital followup to make sure that her potassium has      been repleted and it can be decided at that  time if she needs to      continue with the potassium chloride.  3.  Diabetes, type 2 - her CBGs throughout her admission were pretty stable     at anywhere from low 100s to 200s.  She was sent home on  insulin 70/30,      100 units subcu q.a.m. and 40 units subcu q.p.m. and this may need to be      adjusted as an outpatient.  The patient did admit to not taking her      medications for at least five days prior to admission.  4.  Hypertension - the patient's blood pressure was consistently around 120s      to 130s systolic over 70s to 80s diastolic throughout her admission, and      we held her blood pressure medication during her admission because we      were afraid that her GI losses would make her orthostatic if we gave her      a blood pressure medication.  Once her diarrhea has resolved, she should      be able to go back on her antihypertensive medications and this should      be addressed during her hospital followup.  She was on Atenolol 100 mg      daily, Lisinopril 40 mg daily, and Norvasc 5 mg daily before she came      into the hospital.  5.  Tobacco use - the patient was placed on a nicotine patch and this was      offered to the patient on discharge, which she decided not to accept.      This might be best addressed as an outpatient.  We encouraged the      patient to stop using tobacco.   DISCHARGE LABORATORY AND VITAL SIGNS:  On discharge, her temperature was  97.8, her T-max was 98.3, her pulse was 83, respirations 20, blood pressure  130/81, CBG was 197, and she was satting at 98% on room air.  Her sodium was  139, potassium 3.2, chloride 109, bicarb 23, BUN 6, creatinine 0.7, glucose  179.  WBCs 11.3, hemoglobin 15, platelets 206, and her TSH was 0.6.  Her  free T4 was 1.15.  She was fecal occult blood test negative and, as stated  above, her stool was positive for Giardia.  Her lipid profile from July 08, 2005, revealed a cholesterol of 159, triglycerides 110, HDL 32, LDL 105, and  BLDL 22.  Her magnesium from July 08, 2005, was 1.8 and phosphorus from the  same date was 3.8.   PENDING LABORATORY:  None.      Theora Gianotti   EA/MEDQ  D:  07/15/2005  T:  07/15/2005   Job:  [id number]   cc:   Attn:  Dr. Ardyth Harps [facility]

## 2011-05-09 NOTE — Consult Note (Signed)
NAME:  Jennifer [name], WIELAND NO.:  [id number]   MEDICAL RECORD NO.:  [id number]          PATIENT TYPE:  INP   LOCATION:  5124                         FACILITY:  [facility]   PHYSICIAN:  Alfonse Ras, MD   DATE OF BIRTH:  05/24/1961   DATE OF CONSULTATION:  09/30/2006  DATE OF DISCHARGE:                                   CONSULTATION   CONSULTING SURGEON:  Dr. Colin Benton.   PRIMARY CARE PHYSICIAN:  Dr. Ardyth Harps with the outpatient clinic.   ADMITTING PHYSICIAN:  Internal medicine teaching service.   REASON FOR CONSULTATION:  Rectal abscess.   HISTORY OF PRESENT ILLNESS:  Jennifer [name] is a 50 year old female patient with  a history of diabetes admitted on 09/29/2006 due to complaints of perirectal  pain.  On digital rectal exam by admitting physician there was no palpable  fluctuance although she did have skin abnormalities over the superior  perirectal region. Because of the significant pain and the patient's history  diabetes a CT scan was done this demonstrated a small purulent lesion  concerning for a perirectal abscess.  Surgical consultation has been  requested.   REVIEW OF SYSTEMS:  The patient has been having some fever, no chills.  She  has been complaining of some suprapubic pain today. She began experiencing  pain about 5 days ago, the area started small like a pimple area and the  pain and pressure have increased over the past few day.  She has noticed  significant pain with defecation. No drainage or blood.  She has never had  anything like this before.   PAST MEDICAL HISTORY:  1. Diabetes.  2. COPD with ongoing tobacco abuse.  3. Hypertension.  4. Peripheral vascular disease.  5. Venous insufficiency.  6. Dyslipidemia.  7. Osteoarthritis.   PAST SURGICAL HISTORY:  1. Hysterectomy.  2. Cyst removal of benign breast masses.  3. Ganglion cyst of the wrist that have been removed.   ALLERGIES:  MORPHINE which causes hives.   CURRENT MEDICATIONS:   Please refer to the patient's medication  reconciliation sheet for home medications.  1. Protonix.  2. Lantus insulin.  3. Sliding scale insulin.  4. Zosyn IV.  5. Advair.  6. Lisinopril.  7. Nicotine patch.  8. Vancomycin IV.  9. Albuterol nebs.   SOCIAL HISTORY:  Positive for tobacco, no alcohol.  She is single.  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   FAMILY HISTORY:  Noncontributory.   PHYSICAL EXAMINATION:  GENERAL:  Pleasant female patient complaining of  significant perirectal and sacral pain.  VITAL SIGNS:  Temperature 97.9,  BP 123/81, pulse 64 and regular,  respirations 20.  NEURO:  The patient is alert and oriented x3 moving all extremities x4.  No  focal deficits.  HEENT:  Head normocephalic. Sclerae not injected.  NECK:  Supple.  No adenopathy.  CHEST:  Bilateral lung sounds clear to auscultation. Respiratory efforts  unlabored.  CARDIAC:  There is a soft grade 1/6 early systolic murmur left upper sternal  border.  Pulses regular. There is no  obvious JVD.  ABDOMEN: Soft, nontender, nondistended without hepatosplenomegaly, masses or  bruits.  RECTAL:  Exam shows an erythematous and indurated area at 12  o'clock at the perirectal area. Within this area there is a pimple-like  papule with a purulent head, very small, pinpoint. This area is exquisitely  tender with light palpation.  There is no exudate noted coming out of the  rectal area.  Digital rectal exam was deferred to Dr. Colin Benton.  EXTREMITIES: Symmetrical in appearance without edema, cyanosis or clubbing.  Pulses were diminished but palpable.   LABORATORY:  Hemoglobin A1c is 9.6.  Sodium 142, potassium 3.7, CO2  27, BUN  12 creatinine 0.6, glucose 191, white count 10,500. It was 10,800 on  admission. Hemoglobin 13, platelets 137,000. Neutrophil 30%, lymphocytes  64%, blood cultures are pending.  PT 14.3, INR 1.3.  Diagnostic CT abdomen and pelvis was done and again showed a very  small  perirectal lesion that is concerning for an abscess within the rectal vault.   IMPRESSION:  1. Perirectal abscess with complaints of suprapubic pain today.  2. Uncontrolled diabetes.   PLAN:  1. We need to go ahead and review the CT Dr. Colin Benton. This appears to be a      focal collection of fluid.  The patient is having a new symptom now of      discomfort in the suprapubic region, uncertain if this is radiculopathy      versus some pain secondary to some urinary retention or inflammation in      the bladder region.  2. Due to the significant amount of pain the patient is experiencing, will      probably perform an I&D of this area in      the OR on 10/01/2006.  Write for permit NPO after midnight. Additional      recommendations per Dr. Colin Benton.  Risks and benefits of the procedure if      for by Dr. Colin Benton will be discussed by the surgeon.  3. Continue empiric vancomycin, Zosyn.      Allison L. Rolene Course      Alfonse Ras, MD  Electronically Signed    ALE/MEDQ  D:  09/30/2006  T:  10/01/2006  Job:  [id number]

## 2011-05-09 NOTE — Discharge Summary (Signed)
NAME:  Jennifer [name], Jennifer [name] NO.:  [id number]   MEDICAL RECORD NO.:  [id number]          PATIENT TYPE:  INP   LOCATION:  5124                         FACILITY:  [facility]   PHYSICIAN:  Yetta Barre, M.D.  DATE OF BIRTH:  1961-08-15   DATE OF ADMISSION:  09/29/2006  DATE OF DISCHARGE:  10/07/2006                                 DISCHARGE SUMMARY   DISCHARGE DIAGNOSES:  1. Perirectal abscess status post I&D drainage.  2. Acute renal failure secondary to contrast dye.  3. Diabetes mellitus type 2.  4. Hypertension.  5. COPD.  6. Peripheral vascular disease and chronic vascular insufficiency.  7. Major depressive disorder.  8. 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  9. Arthritis (?).   DISCHARGE MEDICATIONS:  1. Atenolol 50 mg daily.  2. Lasix 40 mg daily.  3. Potassium 20 mEq daily.  4. Insulin NovoLog 70/30 injections 110 units in the morning, 45 units at      night.  5. Albuterol inhaler two puffs p.r.n. q.4 h.  6. Advair 250-50 one puff b.i.d.  7. Lyrica 25 mg t.i.d.  8. Ultram 50 mg p.r.n. 4-6 hours for pain.  Please note that the patient      was advised not to restart aspirin for another week.   DISPOSITION FOLLOW UP:  1. The patient will return to see Dr.  Orson Slick at [facility]      on October 31 at 10:30 a.m.  2. The patient will see Dr. Janee Morn at the Internal Medicine Outpatient      Clinic at [facility] on October 22 at 1:30 p.m.   SPECIAL INSTRUCTIONS:  Please note that the patient should have a B-met to  specifically check the patient's glucose and creatinine levels and should  also have her pressure checked during the hospitalization.  Adjustments were  made to the patient's blood pressure medications and her diabetes regimen.   PROCEDURES:  1. The patient had a CT of the pelvis that showed a perirectal lesion      measuring 1.9 to 2.0 cm that was worrisome for an abscess.  2. The patient had an I&D of  this rectal abscess, i.e. surgery.  3. The patient had a renal ultrasound that did not show any abnormalities.   CONSULTATIONS:  1. Dr. Orson Slick from [facility].  2. Nephrology was also consulted to evaluate the patient's acute renal      failure.   ADMISSION HISTORY AND PHYSICAL:  This is a 50 year old African American  female with past medical history significant for diabetes mellitus type 2  complicated by neuropathy, obesity, hypertension, hyperlipidemia, COPD,  peripheral vascular disease, who presented to the [facility] for a five day history of peroneal pain.  This started five days ago,  and she started noticing the pain midline above her nipple to increase.  It  was nonradiating and gradually progressed to an intensity that has impaired  her from walking.  It is worsened by the event of pressure, but not by  straining or defecating.  There are no  relief factors.  The patient also  noticed a fullness in that same area but is unable to visualize it.  About  one week prior, the patient had a bumper boil on her posterior which she  drained herself.  She has been having recurrent problems on and off that  have never progressed to cellulitis.  Otherwise, the patient denies any  known history of IPD or prior episodes.  Although she is diabetic, she has  never had an infectious complications for.  The patient denies fever,  chills.  She denies nausea, vomiting, abdominal pain, constipation,  hematochezia, melena.   PHYSICAL EXAMINATION:  VITAL SIGNS:  Temperature 99.2, blood pressure  127/90, pulse 83, respirations 20, O2 saturation 100% on room air.  The  patient was not in acute distress, resting, obese.  NECK:  Supple.  No lymphadenopathy.  LUNGS:  Clear to auscultation with good air movement.  CARDIOVASCULAR:  Regular rate and rhythm.  No murmurs, regurgitations or  gallops.  Peripheral pulses were 2+ bilaterally.  ABDOMEN:  Soft, nontender.  No  palpable masses.  RECTAL:  Posterior to her rectum, she had a slightly erythematous area which  measured maybe 2-3 cm in diameter.  It was not clearly  raised, but there  was no clear area of fluctuance beneath.  It was not draining.  Her rectal  exam was normal and did not reproduce the pain that she was describing  prior.  There were no other signs of cellulitis around her buttocks area.  MUSCULOSKELETAL:  Normal.  NEUROLOGICAL:  Also normal.   LABORATORY DATA:  The patient had sodium of 139, potassium 3.3, chloride  103, bicarbonate 28, BUN 8, creatinine 0.6.  Glucose 89.  White blood cell  count 10.8, hematocrit 15.0, bilirubin 0.2, alk-phos 101, AST 33, ALT 46,  protein 7.5, albumin 3.7, calcium 9.7.  EKG normal.   HOSPITAL COURSE:  #1 - PERIRECTAL ABSCESS.  This was suggested by the CT  scan that was obtained.  Surgery was consulted, and Dr. Orson Slick did an  incision and drainage procedure, but he felt that the abscess was not very  deep.  The patient tolerated this procedure well.  Normal saline dressing  changes were prescribed b.i.d.  The patient was placed on antibiotics for  seven days while in the hospital.  The patient did not develop any fevers or  any elevation of white blood cell count during her hospital stay.  She will  go home and will have home health nursing come to her home to help her with  once daily dressing changes, and she will return to see Dr. Orson Slick as  discussed above.   #2 - ACUTE RENAL FAILURE.  The patient has normally baseline creatinine  between 0.6-0.8.  Her creatinine was found to be elevated to 2.1 three days  after receiving contrast dye.  She had a renal ultrasound that did not show  any evidence of obstruction.  She had a urinalysis and sediment that did not  show any evidence of any other intrinsic renal disease.  This elevation was thought to be secondary to IV contrast, although, other possibilities  include her antibiotics including  vancomycin and Bactrim which are probably  discontinued.  She was started on IV fluids and we continued to monitor her  creatinine.  Her creatinine remained elevated at 2.0 the day of discharge  which was about 6 days out from her IV contrast.  Nephrology was consulted  and did not feel that any  further workup needed to be done on her intrinsic  renal disease.  The patient is still making good urine output and we felt  that she could be discharged and be followed closely on an outpatient basis  and have her creatinine rechecked when she returned to her PCP.   #3 - DIABETES.  It is uncertain how well controlled the patient's blood  sugars were prior to her hospitalization.  The patient was on insulin  NovoLog 70/30 at home.  We continued this, and we adjusted her dose to 110  units in the morning and 45 units at night.  She did have some low blood  sugars in the morning, between 60-80, and we had adjusted the dose  accordingly.  She needs to have her blood sugar checked at her next clinic  visit.  The patient was advised to check her blood sugars daily on a regular  basis.  She was provided a Glucometer and strips from the outpatient clinic  during this hospitalization.  The patient was advised to follow up with the  social worker in the clinic to find a plan to get cheaper or free strips so  that she can monitor her glucose.   #4 - HYPERTENSION.  Due to her acute renal failure, we discontinued the  patient's home medications of Lisinopril and hydrochlorothiazide.  To  control her blood pressure, we placed her on Atenolol which was also the  cheapest option for her.  The patient was also placed on Lasix 40 mg,  because while in the hospital, she had developed some lower extremity edema,  possibly secondary to over hydration with IV fluids.  Because of these, we  sent changes in her blood pressure medications.  Her pressure needs to be  checked on her next clinic visit.   #5 - ANEMIA.   The patient, on discharge, had a hemoglobin of 10.2 and  hemoglobin of 11.3 on admission.  This was a normocytic anemia.  She did not  have any evidence of acute blood loss, and did not have any symptoms.  We  feel that this can be monitored as an outpatient, and can be worked up as an  outpatient if necessary.  She had an FOBT in the hospital that was negative.   DISCHARGE LABORATORY DATA/VITAL:  Temperature 98.2, pulse 68, blood pressure  144/75, oxygen saturation 97% on room air.  White blood cell count 8.2,  hemoglobin 10.2, platelets 127, sodium 137, potassium 4.1, chloride 109,  bicarbonate 25, BUN 21, creatinine 2.0, glucose 72.      Yetta Barre, M.D.  Electronically Signed     Yetta Barre, M.D.  Electronically Signed    SS/MEDQ  D:  10/08/2006  T:  10/08/2006  Job:  [id number]   cc:   Lebron Conners, M.D.  Ramiro Harvest, MD [facility] [place]

## 2011-05-09 NOTE — Discharge Summary (Signed)
NAME:  Jennifer [name], Jennifer [name] NO.:  [id number]   MEDICAL RECORD NO.:  [id number]          PATIENT TYPE:  INP   LOCATION:  4710                         FACILITY:  [facility]   PHYSICIAN:  Ileana Roup, M.D.  DATE OF BIRTH:  January 29, 1961   DATE OF ADMISSION:  05/05/2006  DATE OF DISCHARGE:  05/11/2006                                 DISCHARGE SUMMARY   DISCHARGE DIAGNOSES:  1.  Diarrhea most likely viral in etiology.  2.  Bilateral great toe/foot pain, most likely secondary to diabetic      neuropathy.  3.  Type 2 diabetes mellitus.  4.  Chronic obstructive pulmonary disease.  5.  Hypokalemia secondary to diarrhea.  6.  Hypertension.  7.  History of left iliac stent placed in 2005.   DISCHARGE MEDICATIONS:  1.  Hydrochlorothiazide 25 mg p.o. daily.  2.  Lisinopril 40 mg p.o. daily.  3.  Advair 250/50 one puff b.i.d.  4.  Albuterol MDI two puffs q.4-6h. p.r.n.  5.  Lyrica 25 mg p.o. t.i.d.  6.  Vicodin 500/5 one p.o. q.6h. p.r.n.  7.  70/30 insulin 60 units q.a.m. and 30 units q.h.s.  8.  Aspirin 325 mg daily p.o. daily.  9.  Zocor 20 mg p.o. daily.   FOLLOWUP:  The patient is to follow up with her primary care physician, Dr.  Ardyth Harps in the [facility] on May 25, 2006, at 1:30 p.m.  At that time her basic metabolic panel needs to be checked as the patient  had her lisinopril increased from 20 mg to 40 mg two days ago. Creatinine,  however, has been stable throughout hospitalization.   CONSULTATIONS:  Dr. Darrick Penna with [facility].   PROCEDURES DONE THIS ADMISSION:  None.   IMAGES DONE THIS ADMISSION:  1.  Chest x-ray done on May 05, 2006,  showing no acute abnormality. However      follow-up PA and lateral chest recommended for further delineation of      underlying lung parenchyma. Evaluations is limited on this AP, upright      exam secondary to patient's habitus and therefore follow-up chest x-ray      is recommended.  2.  X-ray of bilateral feet  done on May 05, 2006, was negative for      radiographic evidence of osteomyelitis.  3.  Chest x-ray done on May 06, 2006, was done to check placement of PICC      lying which is appropriate.  4.  2-D echo done on May 07, 2006, shows normal left ventricular systolic      function, left ventricular ejection fraction estimated around 35% to      60%, no left ventricular regional wall motion abnormality, left      ventricular wall thickness was at upper limits of normal. Left      ventricular diastolic function parameters were normal.  5.  ABI done on May 05, 2006, showed a right ABI of 0.80 and left ABI of      0.95 on preliminary findings. Grade 2 pressures are flat bilaterally.   BRIEF  HISTORY AND PHYSICAL:  Please see medical records for further details.  [name] is a 50 year old African American woman with a past medical  history of uncontrolled complicated diabetes mellitus with peripheral  neuropathy, hypertension, COPD, peripheral vascular disease, status post  left common iliac recannulization with balloon-assisted intra-aortic stent  done in 2005, obesity, and depression, presenting with a three day history  of subjective fevers, chills, nausea, vomiting, and more six episodes of  diarrhea per day. Stools are yellowish with no blood. Was later changed to  around green in color. The patient was seen in the outpatient clinic around  one month ago and was sent home on a 10-day course of Augmentin for  bilateral great toe blisters. Both big toes are red and warm to touch as per  the patient.   ALLERGIES:  MORPHINE.   PAST MEDICAL HISTORY:  1.  Uncontrolled complicated diabetes mellitus.  2.  Hypertension.  3.  COPD.  4.  Peripheral vascular disease, status post left common iliac      decannulization with balloon assisted intra-aortic stent done in      November 2005 under interventional radiology.  The patient had an ABI in      December 2005. The right side was 1.04 and the  left side was 0.92.  5.  Obesity.  6.  Depression.  7.  History of suicidal ideation in December 2006. Evaluated by [facility].   MEDICATIONS PRIOR TO ADMISSION:  1.  Hydrochlorothiazide 25 mg p.o. daily.  2.  Lisinopril 20 mg p.o. daily.  3.  70/30 insulin 100 units q.a.m. and 40 units q.p.m.  4.  Lyrica 75 mg p.o. t.i.d.  5.  Advair 250/50 b.i.d.  6.  Albuterol MDI p.r.n.   Of note the patient reports not taking her medications for the last four  weeks.   SUBSTANCE HISTORY:  The patient is a current smoker, smoking around one pack  per day. No history of alcohol or drug abuse.   SOCIAL HISTORY:  The patient is divorced. She works 12 hours a week at [employer]. She lives here in [place] with her son.   FAMILY HISTORY:  Mother is alive and has diabetes, hypertension, and  congestive heart failure. Father is alive, 58 years of age, and has asthma.  She has two sons.   REVIEW OF SYSTEMS:  She has fever, chills, nausea, vomiting, diarrhea,  vaginal discharge (yeast infection),  and weakness.   PHYSICAL EXAMINATION:  VITAL SIGNS: Temperature 97.8. Blood pressure sitting  170/100 with a pulse of 80, standing blood pressure 150/80 with pulse of  110. Respiratory rate of 20. O2 saturation is 95% on room air.  GENERAL APPEARANCE: The patient looked fatigued,  HEENT:  Pupils equal, round, and reactive to light. Extraocular movements  intact. Oropharynx reveals dry mucous membranes.  NECK: Supple with no JVD, no adenopathy.  LUNGS: Clear to auscultation bilaterally. No rales, rhonchi, or wheezes.  HEART: Regular rate and rhythm with no murmurs, rubs, or gallops.  ABDOMEN: Soft, nontender, nondistended. Bowel sounds present.  EXTREMITIES: Bilateral great toes with healing blisters, warm to touch,  erythematous.  NEUROLOGIC: Alert and oriented times three. Mental status intact. Cranial nerves II-XII intact. Reflexes are 2+ and symmetric. Sensation is  decreased  in bilateral feet.  PSYCHIATRIC: Appropriate.   LABORATORY ON EXAMINATION:  Hemoglobin 14.9, white count 6.2, platelet  154,000. Sodium 133, potassium 3.9, chloride 99, bicarbonate 29, BUN 8,  creatinine  0.7, glucose of 189, alkaline phosphatase 81, total bilirubin  0.7, SGOT 52, SGPT 51, protein 6.5, albumin 3.2, and potassium 3.6.   HOSPITAL COURSE:  Problem #1:  Diarrhea. The patient was orthostatic when  she came in and therefore she was given a lot of IV fluids. Stool studies  were sent for stool cultures and were negative for any organisms. The  patient had a history of antibiotic therapy prior to admission. Therefore,  she was checked for Clostridium difficile which was negative. The patient  also said that in July 2006 she had diarrhea which was positive for Giardia,  therefore this was checked as well and was negative along with  cryptosporidiosis as well. During the course of her admission the patient's  diarrhea changed from watery to slightly maroonish in color. Therefore,  occult blood was checked, which was negative. Exact cause of her diarrhea is  still unknown and most likely this was a viral gastroenteritis. The patient  was rehydrated during hospitalization and on the day of admission she did  not show any signs of dehydration. Her mucous membranes were moist and she  was eating and tolerating her oral food. Diarrhea had also resolved on the  day of discharge.   Problem #2:  Bilateral great toe pain.  This was a concerning factor  initially, especially that the patient had a history of left common iliac  artery stents placed. On examination she showed some amount of discoloration  of bilateral toes, but no erythema was present and no swelling. This did not  seem to be a gout-like picture given that it was symmetrically present and  also there were no acute signs of inflammation present. Another possibility  was vascular insufficiency as she had a history of  that and therefore we  consulted CV[facility], Dr. Darrick Penna. ABI was done as well and results are as above.  Dr. Darrick Penna evaluated the patient and felt that the pulses were good  bilaterally, and therefore this was most likely secondary to neuropathy.  Her symptoms also seem to be more consistent with neuropathy given the  typical stocking-like diabetic neuropathy picture. Therefore, Lyrica was  increased from 75 mg b.i.d. to 75 mg p.o. t.i.d. The maximum she can get is  around 100 mg t.i.d. Therefore, there was room for it. Also, pain control  was with Toradol and Darvocet. The patient said pain was moderately  controlled with that, though she has tried in the past and said that that  worked pretty well for her. On the day of discharge she was given Vicodin  and the patient said that it worked. Therefore, a prescription for around  30 pills was given before discharge, to be taken on a p.r.n. basis. This is unlikely cellulitis as there was no break in skin, although there was some  multifungal infection of her toes. The patient's skin was not tight,  erythematous, and swollen likely with what you see in cellulitis, and also  that she was put on a 10-day course of Augmentin prior to that. Therefore,  cellulitis was unlikely.   Problem #2:  Diabetes mellitus. The patient's blood sugars were not under  control when she came and her hemoglobin A1c was 9.3 on her in-clinic chart.  This is probably because the patient says that she does not have money to  get medication even from the county pharmacy. She was out of diabetic  medication for about three to four days before she came to the hospital.  Therefore,  during hospitalization, once the patient started eating blood  sugars were controlled with insulin on a sliding scale coverage along with  Lantus. Then she was switched over to a home regimen of 70/30 insulin at  initially half the dose which will be increased according to her blood  sugars. On the  day of discharge her blood sugars were in the 250 range and  therefore she will be sent home on 70/30 insulin 60 units to be taken in the  morning and 30 units to be taken at bedtime. The patient was instructed  regarding the change in medications and her primary care physician will take  note of it as well.   Problem #3:  Hypertension. The patient's blood pressure was not under  control initially. Therefore, her lisinopril of 20 mg was increased to 40 mg  p.o. daily. Diuretic of hydrochlorothiazide 25 mg p.o. daily was kept the  same.   Problem #4:  Chronic obstructive pulmonary disease. This was stable  throughout hospitalization. The patient did not complain of any shortness of  breath or chest pain. She was kept on her home medications of Advair and  albuterol for the same. The patient does not seem to be willing to quit  smoking right now. She was tried on the patch during hospitalization and  there was, I think, an episode of her smoking in the bathroom. I would not  like to send her home on the patch for the same reason, because I doubt she  will be able to stop until she gets motivated towards it.   Problem #4:  Hyperkalemia secondary to diarrhea. The patient's potassium was  repleted throughout hospitalization and on the day of discharge the  potassium was 3.7.   DISCHARGE LABORATORY:  Sodium 143, potassium 3.7, chloride 109, bicarbonate  28, BUN 9,  creatinine 0.6, glucose 131, total bilirubin 0.5, alkaline  phosphatase 80, AST 40, ALT 56, protein 5.7, albumin 3.0, calcium 8.5.  Hemoglobin 12.4, hematocrit 36.5, platelet count 129,000, and white count of  9.4.   Discharge vitals reveal a temperature of 98.1, blood pressure 158/76, pulse  57, respiratory rate 18. ABG of 159.      Yetta Barre, M.D.    ______________________________  Ileana Roup, M.D.    SS/MEDQ  D:  05/11/2006  T:  05/11/2006  Job:  [id number]

## 2011-05-09 NOTE — Discharge Summary (Signed)
NAME:  Jennifer [name], Jennifer [name]                           ACCOUNT NO.:  [id number]   MEDICAL RECORD NO.:  [id number]                   PATIENT TYPE:  INP   LOCATION:  3034                                 FACILITY:  [facility]   PHYSICIAN:  Ileana Roup, M.D.               DATE OF BIRTH:  [birth year]   DATE OF ADMISSION:  04/03/2003  DATE OF DISCHARGE:  04/05/2003                                 DISCHARGE SUMMARY   DISCHARGE DIAGNOSES:  1. Acute bronchitis.  2. Headache.  3. Hypertension.  4. Diabetes mellitus.  5. Tobacco abuse.   PAST MEDICAL HISTORY:  1. Diabetes mellitus since 1993, apparently requiring insulin.  2. Asthma.  3. Rheumatoid arthritis.  4. 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  5. Hypertension.  6. Tobacco abuse.  7. Severe lower extremity varicosities.  8. History of hysterectomy secondary to fibroids in 1997.  Also with bladder     reconstruction.  9. History of tubal ligation.  10.      History of several breast mass removals.  11.      History of several wrist cyst removals.   DISCHARGE MEDICATIONS:  1. Neurontin 300 mg b.i.d.  2. HCTZ 25 mg daily.  3. Albuterol inhaler p.r.n. q.4h.  4. Insulin 70/30, 70 units q.a.m. and 20 units q.p.m.  5. Lotensin 40 mg daily.  6. Effexor 75 mg daily.  7. Prednisone 60 to 10 taper over eight days.  8. Norvasc 2.5 mg daily.  9. Azithromycin 500 mg daily x7 days.  10.      Vicodin 5/500 one tablet q.4-6h. p.r.n. pain.  11.      Flovent 88 mcg. inhaler b.i.d. x1 month.   CONSULTS:  None.   PROCEDURES:  None.   FOLLOW UP:  The patient has a follow up visit at [facility] on 04/10/03 at 3 p.m.   HISTORY OF PRESENT ILLNESS:  A 50 year old black female with a past medical  history of diabetes, hypertension, tobacco, who presents with a one week  history of productive cough, shortness of breath, fevers, chills, myalgias,  headaches, and sinus congestion.  The patient says that one week prior to  admission she began having symptoms.  She went to the outpatient clinic and  was given a prescription for doxycycline 100 mg twice a day, Afrin, and a  medicine for cough.  She says that the fever, chills, and myalgias improved  over the next could of days; however, her cough and shortness of breath  continue, as well as her headache.  She denies any orthopnea or PND.  No  nausea, vomiting, or diarrhea.  No chest pain or palpitations.   ALLERGIES:  No known drug allergies.   SOCIAL HISTORY:  The patient smokes one pack of cigarettes a day for the  past 25 years.  She occasionally drinks alcohol and denies any cocaine or IV  drug  use.  She is currently separated and lives alone here in [place].   FAMILY HISTORY:  Her mother is 71 years old with diabetes, coronary artery  disease, congestive heart failure, and a heart attack two years ago.  She  says that her father is 87 years old and has asthma.  She has a 26 year old  son, and has three children total.   PHYSICAL EXAMINATION:  VITAL SIGNS:  Pulse 72, blood pressure 180/99,  temperature 98.1, respirations 24, O2 sat 93% on room air and 97% on two  liters.  GENERAL:  Obese black female, somewhat agitated, in mild distress.  HEENT:  Erythematous oropharynx.  Otherwise no ulcers or lesions noted.  NECK:  No cervical lymphadenopathy, no JVD.  RESPIRATION:  Expiratory wheezes bilaterally, some lower lobe fine crackles.  CARDIOVASCULAR:  Regular rate and rhythm without appreciable murmur.  GI:  Belly obese, nontender, nondistended, with positive bowel sounds.  EXTREMITIES:  No lower extremity pitting edema bilaterally.   ADMISSION LABORATORIES:  Sodium 138, potassium 6.1, with marked hemolysis.  Chloride 104, bicarbonate 27, BUN 9, creatinine 0.5, glucose 173, alkaline  phosphatase 74, bilirubin 2.1, also with hemolysis.  AST 61, ALT 34, protein  2.5, albumin 2.2.  White count 8.5, hemoglobin 15.2, platelets 205.  Chest x-  ray showed  chronic changes and no acute disease, and the EKG showed normal  sinus rhythm.   HOSPITAL COURSE:  Problem 1.  Acute bronchitis.  The patient says that she  has a history of asthma; however, she also has an extensive tobacco abuse  history, and her x-ray did show some chronic changes.  We checked pre and  post bronchodilator peak flows, and the initial one showed a peak flow of  250 before, and 300 after bronchodilators.  We treated her as if this were a  COPD exacerbation, and gave her albuterol and Atrovent nebs, IV steroids,  and azithromycin for possible pneumonia.  Given the fact that she had  several doses of doxycycline and she was without fevers and chills, there  was a low suspicion for pneumonia; however, we did continue to treat her  with azithromycin for a total of 10 days.  She completed her course at home.  The patient was also given a prescription for inhaled steroids before she  left and was told to use Flovent for at least one month.   Problem 2.  Headache.  As noted, the patient said that she had a headache  prior to coming into the hospital.  Although there was some mild  improvement, the patient still had the headache when she left the hospital.  It is probably secondary to sinus congestion plus or minus a slight  elevation in blood pressure.  It was decide that given the high blood  pressure the patient was not a candidate for pseudoephedrine.  She says that  she had taken Afrin in the past and had tolerated it somewhat, so we  therefore sent her home with instructions to continue use of Afrin for what  we believe to be sinus congestion as the cause of her headaches.  Otherwise  the patient will need to have better control of her blood pressure.   Problem 3.  Hypertension.  The patient's blood pressure when she came in was  180/99.  It remained pretty high throughout the hospital stay; however, it was determined that she could optimize her blood pressure in the  outpatient  setting.  The patient was started on HCTZ during  this hospital stay.  She  was already on Lotensin.  If this is not enough, the patient may need a  calcium channel blocker, as well, such as Norvasc.  The patient will need to  follow up in the outpatient setting for further blood pressure checks.   Problem 4.  Diabetes mellitus.  We continued the patient's regimen of 70/30  while here in the hospital; however, her hemoglobin A1C was 9.1.  It was  difficult to tell exactly how high her sugars were in the hospital because  she was on steroids; however, with the high hemoglobin A1C, it is unlikely  that she has excellent control.  When the patient is off steroids, her  insulin regimen should probably be adjusted.  Of note, she did have a  hemoglobin A1C of 10.2 in 1/04, so the 9.1 is somewhat of an improvement.   Problem 5.  Tobacco abuse.  The patient was instructed that her bronchitis  was probably somewhat a result of her smoking.  We recommended to her to  stop smoking.  She also talked with our smoking cessation specialist here in  the hospital; however, the patient is quoted as saying, No, I don't want to  stop smoking.  Again, this is something that may need to be addressed as an  outpatient over a period of time, as the patient currently is not in the  receptive phase to quit smoking.   DISCHARGE LABORATORIES:  White count 12, hemoglobin 15.5, platelets 224.  Sodium 136, potassium 3.5, chloride 103, bicarbonate 22, glucose 255, BUN 8,  creatinine 0.6, calcium 9.2.  Hemoglobin A1C 9.1.     Eliseo Gum, M.D.                      Ileana Roup, M.D.    KC/MEDQ  D:  05/04/2003  T:  05/06/2003  Job:  [id number]

## 2011-05-09 NOTE — Assessment & Plan Note (Signed)
Her skin lesions appear to be consistent with healing folliculitis.  She has no active lesions currently so it is difficult to know exactly they are.

## 2011-05-09 NOTE — Discharge Summary (Signed)
NAME:  Jennifer [name], Jennifer [name]                           ACCOUNT NO.:  [id number]   MEDICAL RECORD NO.:  [id number]                   PATIENT TYPE:  INP   LOCATION:  5041                                 FACILITY:  [facility]   PHYSICIAN:  Almedia Balls. Ranell Patrick, M.D.              DATE OF BIRTH:  June 24, 1961   DATE OF ADMISSION:  08/28/2004  DATE OF DISCHARGE:  08/31/2004                                 DISCHARGE SUMMARY   ADMISSION DIAGNOSES:  1.  Chronic right shoulder impingement.  2.  Right acromioclavicular arthrosis.  3.  Hypertension.  4.  Diabetes.   DISCHARGE DIAGNOSES:  1.  Chronic right shoulder impingement.      1.  Status post shoulder arthroscopy and open distal clavicle resection.  2.  Right acromioclavicular arthrosis.      1.  Status post shoulder arthroscopy and open distal clavicle resection.  3.  Hypertension.  4.  Diabetes.   BRIEF HISTORY:  The patient is a 50 year old female who comes in complaining  about continued right shoulder pain that is nonrefractory to any  conservative management including physical therapy and also cortisone  injections. The patient had elected to have this procedure. MRI demonstrates  a possible rotator cuff tear on top of AC arthrosis and chronic shoulder  impingement.   PROCEDURE:  On August 29, 2004, the patient had a right shoulder  arthroscopy, subacromial decompression, debridement of partial-thickness  rotator cuff tear, and open distal clavicle resection of her right shoulder.  The patient tolerated her procedure well, was given postoperative  antibiotics. The attending surgeon was Almedia Balls. Ranell Patrick, M.D., assistant was  Donnie Coffin. Durwin Nora, P.A. Minimal blood loss. No complications.   HOSPITAL COURSE:  The patient was admitted on August 28, 2004, for a preop  clearance from outpatient clinic and also to be administered a beta-blocker  for her high blood pressure and monitor her diabetes. The patient underwent  the above-stated procedure on  August 29, 2004, with no complications,  after which she was transferred to the postanesthesia care unit and then  back up to the 5th floor. On postoperative day #1, the patient complained  about moderate pain, but denied any numbness, tingling, paresthesias, and  she was afebrile throughout her stay. The patient was discharged on  postoperative day #2 with a sling, pain medicine and also some general  exercises. We will see the patient back in 7-10 days for followup in the  office.   DISCHARGE MEDICATIONS:  1.  Dilaudid 4 mg q.4-6h. p.r.n. pain.  2.  Robaxin 500 mg q.6h.  3.  Elavil 25 mg nightly.  4.  Flovent 44 mcg 1 puff inhaler b.i.d.  5.  Hydrochlorothiazide 25 mg p.o. daily.  6.  Neurontin 300 mg b.i.d.  7.  Novolin 70/30 units injectable 90 units subcutaneous before meals.  8.  Novolin 70/30 units 20 units at nighttime.  9.  Spiriva Aerolizer  18 mcg inhaler daily.  10. Prinivil 40 mg p.o. daily.  11. Tenormin 50 mg 1 tab daily.  12. Ventolin 90 mcg inhaler 1-2 puffs inhaler q.6h. p.r.n.   DISPOSITION:  The patient will be discharged to home on August 31, 2004.   ACTIVITY:  The patient is to be taking it easy wearing her sling at all time  on her right arm.   FOLLOWUP:  We will see the patient back in the office in 7-10 days.   DISCHARGE INSTRUCTIONS:  The patient is to change her dressings daily.   CONDITION:  Stable.   DIET:  She is to have a diabetic diet.      Thomas B. Durwin Nora, P.A.                     Almedia Balls. Ranell Patrick, M.D.    TBD/MEDQ  D:  08/30/2004  T:  08/31/2004  Job:  [id number]

## 2011-05-09 NOTE — Op Note (Signed)
NAME:  Jennifer [name], Jennifer [name]                           ACCOUNT NO.:  [id number]   MEDICAL RECORD NO.:  [id number]                   PATIENT TYPE:  INP   LOCATION:  5041                                 FACILITY:  [facility]   PHYSICIAN:  Almedia Balls. Ranell Patrick, M.D.              DATE OF BIRTH:  23-Sep-1961   DATE OF PROCEDURE:  08/29/2004  DATE OF DISCHARGE:                                 OPERATIVE REPORT   PREOPERATIVE DIAGNOSES:  1.  Right shoulder pain secondary to partial thickness rotator cuff tear.  2.  Chronic impingement.  3.  Acromioclavicular joint arthrosis.   POSTOPERATIVE DIAGNOSES:  1.  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  2.  Partial thickness rotator cuff tear leading edge of supraspinatus.  3.  Superior labral tear, anterior and posterior, extensive with undermining      of biceps anchor.  4.  Chronic impingement syndrome with bursitis.  5.  Acromioclavicular joint arthrosis.   PROCEDURES:  1.  Right shoulder examination under anesthesia, manipulation under      anesthesia.  2.  Diagnostic arthroscopy with extensive intra-articular debridement of      superior labral tear, anterior and posterior, followed by arthroscopic      biceps tenotomy and debridement of partial thickness rotator cuff tear      followed by arthroscopic subacromial decompression and open distal      clavicle excision.   SURGEON:  Almedia Balls. Ranell Patrick, M.D.   ASSISTANT:  Donnie Coffin. Durwin Nora, P.A.   ANESTHESIA:  General anesthesia plus interscalene block anesthesia was used.   ESTIMATED BLOOD LOSS:  Minimal.   FLUIDS REPLACED:  1200 mL of crystalloid.   INSTRUMENT COUNT:  Correct.   COMPLICATIONS:  There were no complications.   ANTIBIOTICS:  Preoperative antibiotics were given.   INDICATIONS FOR PROCEDURE:  The patient is a 50 year old female who presents  with disabling right shoulder pain.  The patient has had a work related  injury and presents now for operative treatment of refractory  shoulder pain.  I suspect a partial thickness rotator cuff tear and chronic  acromioclavicular arthrosis.  Informed consent was obtained.  The patient  has failed conservative management.   DESCRIPTION OF PROCEDURE:  After an adequate level of anesthesia was  achieved, the patient was positioned in the modified beach chair position  and the right shoulder was examined under anesthesia.  Forward elevation was  limited to 100 degrees. This was manipulated easily up to 180 degrees with  some audible popping.  The patient's abduction was 120 degrees and external  rotation was out to about 70 degrees.  Internal rotation was seen to be  somewhat limited improved with a gentle manipulation.  After completion of  the gentle manipulation and anesthesia re-gaining full range of motion  passively, the shoulder was sterilely prepped and draped in the usual  manner. A diagnostic glenohumeral arthroscopy was performed through standard  arthroscopic portals.  Anterior, lateral and posterior portals were all  created in a similar fashion with infiltration of the skin with 0.25%  Marcaine with epinephrine followed by incision with an 11 blade scalpel and  introduction of the cannula into the joint using blunt obturators.  Diagnostic arthroscopy revealed an extensively torn superior labrum with an  unstable biceps anchor.  This was debrided back to stable labral tissue  requiring a biceps tenotomy to relieve tension on the torn labrum.  The  anterior inferior labrum was normal.  The subscapularis was normal.  The  rotator interval appeared normal. There is significant hyperemia present in  the joint consistent with the patient's adhesive capsulitis on examination  under anesthesia.  The rotator cuff had a leading edge supraspinatus tear  partial thickness.  The articular side of this was debrided back to a stable  rotator cuff tendon tissue.  This represented well less than 50% of the  rotator cuff foot  print.  We did abrade the foot print as well with the  shaver which will facilitate healing.  The posterior cuff, infraspinatus and  teres minor were normal.  The posterior labrum was normal as visualized from  the anterior portal.  The inferior capsular patch was of normal size, but  significantly inflamed.  The scope was then concluded after the completion  of debridement of the superior labral tear both from the anterior and  posterior portals as well as release of the biceps tendon using the  ArthriCare unit.  The scope was placed in the subacromial space and a  thorough bursectomy was performed followed by a formal acromioplasty using a  motorized bur.  A type one acromion was created all the way over to the  acromioclavicular joint and out laterally.  The CA ligament was released.  At this point, the cuff was inspected from the bursal side and absolutely no  bursal sided tear or abnormality was noted.  There was nice clearance  between the rotator cuff and the remaining acromion and deltoid fascia.  At  this point, the scope was concluded. A small saber incision in Langer skin  lines was created overlying the acromioclavicular joint and taken down  through the subcutaneous tissues using Bovie electrocautery.  The  deltotrapezial fascia was identified and incised in line with the distal  clavicle.  A subperiosteal dissection was carried out exposing the distal  clavicle and the distal 1 cm was removed using the oscillating saw.  Thorough irrigation of the acromioclavicular interval was performed followed  by application of bone wax to the cut end of the clavicle.  At this point,  the surgeon's finger was placed in the acromioclavicular interval and the  shoulder was taken through a full range of motion.  No impingement was  noted.  Again, the interval was irrigated.  The deltotrapezial fascia was repaired using interrupted figure-of-eight 0 Vicryl suture followed by 2-0  Vicryl  subcuticular closure and 4-0 running Monocryl for skin.  Steri-Strips  were utilized and a sterile dressing was applied followed by a shoulder  sling.  The patient was taken to the recovery room in stable condition.                                               Almedia Balls. Ranell Patrick, M.D.    SRN/MEDQ  D:  08/29/2004  T:  08/29/2004  Job:  [id number]

## 2011-05-11 NOTE — Assessment & Plan Note (Signed)
She is due for refills of her lexapro and trazodone today.  She is tolerating them well so we will continue for now and continue to monitor.

## 2011-05-11 NOTE — Assessment & Plan Note (Signed)
Blood pressure is near goal today.  We will continue to follow and adjust her medications if she continues to be above her goal of <130/80

## 2011-05-11 NOTE — Assessment & Plan Note (Signed)
A recent colonoscopy showed scattered diverticular disease.  Her LLQ pain is consistent with diverticular disease.  She has no fevers, chills, diarrhea, or blood in her stool so this is likely not diverticulitis.  She was given information on dietary changes that can help with control of diverticular disease.  She has no indication for antibiotics at this time but if the pain continues at her follow up or she develops a fever or blood in her stool we could empirically treat with antibiotics.

## 2011-05-11 NOTE — Assessment & Plan Note (Signed)
Lab Results  Component Value Date   HGBA1C 9.0 05/02/2011   HGBA1C 10.6 11/05/2010   HGBA1C 10.0 08/20/2010   Lab Results  Component Value Date   MICROALBUR 0.50 09/14/2009   LDLCALC 109* 02/07/2010   CREATININE 0.67 02/07/2010   A1C today is better then previously but still above her goal of 7.  She is currently on 75/25 110 units at breakfast and 55 units at bed times.  After reviewing her blood sugar logs she is worse after lunch and supper so we will increase her evening dose to 65 units and continue to follow.

## 2011-05-20 ENCOUNTER — Encounter: Payer: Medicare Other | Admitting: Physical Therapy

## 2011-05-21 ENCOUNTER — Other Ambulatory Visit: Payer: Self-pay | Admitting: Internal Medicine

## 2011-05-21 ENCOUNTER — Ambulatory Visit: Payer: Medicare Other | Admitting: Physical Therapy

## 2011-05-28 ENCOUNTER — Other Ambulatory Visit: Payer: Self-pay | Admitting: *Deleted

## 2011-05-29 MED ORDER — PREGABALIN 100 MG PO CAPS: 100.0000 mg | ORAL_CAPSULE | Freq: Three times a day (TID) | ORAL | Status: DC

## 2011-05-30 NOTE — Telephone Encounter (Signed)
Lyrica rx called to [facility]. 

## 2011-06-18 ENCOUNTER — Other Ambulatory Visit: Payer: Self-pay | Admitting: Internal Medicine

## 2011-06-18 DIAGNOSIS — I10 Essential (primary) hypertension: Secondary | ICD-10-CM

## 2011-06-18 MED ORDER — ATENOLOL 100 MG PO TABS: 100.0000 mg | ORAL_TABLET | Freq: Every day | ORAL | Status: DC

## 2011-06-19 ENCOUNTER — Telehealth: Payer: Self-pay | Admitting: Dietician

## 2011-06-19 DIAGNOSIS — E785 Hyperlipidemia, unspecified: Secondary | ICD-10-CM

## 2011-06-19 NOTE — Telephone Encounter (Signed)
Patient agreed to come in for lipid panel before seeing Dr. Aldine Contes on 7/13, but wants to come in on a different day. Scheduled appointment with CDE on same day as doctor.  Please order lipid profile and sing referral for medical nutrition therapy and diabetes self management training

## 2011-06-24 ENCOUNTER — Other Ambulatory Visit: Payer: Self-pay | Admitting: Internal Medicine

## 2011-06-24 NOTE — Telephone Encounter (Signed)
Need for BID tramadol has not been documented in a while. Will refill for a couple of months and ask that she be assessed by PCP.

## 2011-06-30 ENCOUNTER — Telehealth: Payer: Self-pay | Admitting: *Deleted

## 2011-06-30 NOTE — Telephone Encounter (Signed)
Agree with plan 

## 2011-06-30 NOTE — Telephone Encounter (Signed)
Pt calls and states she has appts fri w/ md and jayg. But sat night while in [place] an "ulcer" on the bottom of her foot "busted" and there is a "split" in the skin between several toes, she states she has not felt well in several days. She is ask to go to urg care today for eval and keep appts fri. She states she wants to call the wound care center before and ask if they can see her today and then she will go to urg care if they cannot see her today, also states she will keep her appts fri. Ask to call back if she encounters any problems w/ her plan. She is agreeable

## 2011-07-01 ENCOUNTER — Inpatient Hospital Stay (INDEPENDENT_AMBULATORY_CARE_PROVIDER_SITE_OTHER)
Admission: RE | Admit: 2011-07-01 | Discharge: 2011-07-01 | Disposition: A | Discharge status: Home / Self Care | Payer: Medicare Other | Source: Ambulatory Visit | Attending: Family Medicine | Admitting: Family Medicine

## 2011-07-01 ENCOUNTER — Encounter: Payer: Medicare Other | Admitting: Internal Medicine

## 2011-07-01 DIAGNOSIS — T148XXA Other injury of unspecified body region, initial encounter: Secondary | ICD-10-CM

## 2011-07-01 DIAGNOSIS — I872 Venous insufficiency (chronic) (peripheral): Secondary | ICD-10-CM

## 2011-07-04 ENCOUNTER — Ambulatory Visit: Payer: Medicare Other | Admitting: Dietician

## 2011-07-04 ENCOUNTER — Encounter: Payer: Self-pay | Admitting: Internal Medicine

## 2011-07-04 ENCOUNTER — Ambulatory Visit (INDEPENDENT_AMBULATORY_CARE_PROVIDER_SITE_OTHER): Payer: Medicare Other | Admitting: Internal Medicine

## 2011-07-04 ENCOUNTER — Other Ambulatory Visit: Payer: Medicare Other

## 2011-07-04 DIAGNOSIS — F32A Depression, unspecified: Secondary | ICD-10-CM

## 2011-07-04 DIAGNOSIS — I10 Essential (primary) hypertension: Secondary | ICD-10-CM

## 2011-07-04 DIAGNOSIS — E785 Hyperlipidemia, unspecified: Secondary | ICD-10-CM

## 2011-07-04 DIAGNOSIS — E119 Type 2 diabetes mellitus without complications: Secondary | ICD-10-CM

## 2011-07-04 DIAGNOSIS — F329 Major depressive disorder, single episode, unspecified: Secondary | ICD-10-CM

## 2011-07-04 DIAGNOSIS — F3289 Other specified depressive episodes: Secondary | ICD-10-CM

## 2011-07-04 LAB — GLUCOSE, CAPILLARY: Glucose-Capillary: 177 mg/dL — ABNORMAL HIGH (ref 70–99)

## 2011-07-04 MED ORDER — PREGABALIN 100 MG PO CAPS: 100.0000 mg | ORAL_CAPSULE | Freq: Three times a day (TID) | ORAL | Status: DC

## 2011-07-04 NOTE — Assessment & Plan Note (Signed)
Patient is due for fasting lipid panel check but she refuses to have blood work done today since she was not fasting. We have agreed that she will schedule an appointment for blood work specifically. We'll make no changes to her current medication regimen today.

## 2011-07-04 NOTE — Progress Notes (Signed)
  Subjective:    [name], female    DOB: [birth year], 50 y.o.   MRN: [id number]  HPI Patient is 50 year old female with past medical history outlined below who presents to clinic for regular followup on blood pressure and diabetes. She reports compliance with her medications. She denies recent sicknesses or hospitalizations. No episodes of chest pain or shortness of breath, no abdominal or urine or concerns. Approximately 2 weeks ago she has developed left foot ulcer which was determined to be secondary to venous stasis and she has visited wound care specialist for continuation of care. She reports the ulcer if healing.   Review of Systems Constitutional: Denies fever, chills, diaphoresis, appetite change and fatigue.  HEENT: Denies photophobia, eye pain, redness, hearing loss, ear pain, congestion, sore throat, rhinorrhea, sneezing, mouth sores, trouble swallowing, neck pain, neck stiffness and tinnitus.   Respiratory: Denies SOB, DOE, cough, chest tightness,  and wheezing.   Cardiovascular: Denies chest pain, palpitations and leg swelling.  Gastrointestinal: Denies nausea, vomiting, abdominal pain, diarrhea, constipation, blood in stool and abdominal distention.  Genitourinary: Denies dysuria, urgency, frequency, hematuria, flank pain and difficulty urinating.  Musculoskeletal: Denies myalgias, back pain, joint swelling, arthralgias and gait problem.  Skin: Denies pallor, rash and wound.  Neurological: Denies dizziness, seizures, syncope, weakness, light-headedness, numbness and headaches.  Hematological: Denies adenopathy. Easy bruising, personal or family bleeding history  Psychiatric/Behavioral: Denies suicidal ideation, mood changes, confusion, nervousness, sleep disturbance and agitation     Objective:   Physical Exam    Constitutional: Vital signs reviewed.  Patient is a well-developed and well-nourished in no acute distress and cooperative with exam. Alert and  oriented x3.  Neck: Supple, Trachea midline normal ROM, No JVD, mass, thyromegaly, or carotid bruit present.  Cardiovascular: RRR, S1 normal, S2 normal, no MRG, pulses symmetric and intact bilaterally Pulmonary/Chest: CTAB, no wheezes, rales, or rhonchi Abdominal: Soft. Non-tender, non-distended, bowel sounds are normal, no masses, organomegaly, or guarding present.  Musculoskeletal: No joint deformities, erythema, or stiffness, ROM full and no nontender Psychiatric: Normal mood and affect. speech and behavior is normal. Judgment and thought content normal. Cognition and memory are normal.  Unable to examine feet since both are wrapped in surgical wraps and pt was told not have any of this removed.     Assessment & Plan:

## 2011-07-04 NOTE — Patient Instructions (Signed)
3 schedule followup appointment in 3 months.

## 2011-07-04 NOTE — Assessment & Plan Note (Signed)
Systolic blood pressure slightly above the target goal. Patient reports compliance with current medications as well as recommended diet and exercise. We will not make any changes to her recommendation regimen today I have encouraged patient to continue checking her blood pressure and to call us back if the blood pressure is persistently higher than 150/90. Patient is due for electrolyte panel lab work but she refuses labs today. Will attempt on the next visit.

## 2011-07-11 ENCOUNTER — Other Ambulatory Visit: Payer: Self-pay | Admitting: Plastic Surgery

## 2011-07-11 ENCOUNTER — Encounter (HOSPITAL_BASED_OUTPATIENT_CLINIC_OR_DEPARTMENT_OTHER): Payer: Medicare Other | Attending: Plastic Surgery

## 2011-07-11 DIAGNOSIS — L02419 Cutaneous abscess of limb, unspecified: Secondary | ICD-10-CM | POA: Insufficient documentation

## 2011-07-11 DIAGNOSIS — M069 Rheumatoid arthritis, unspecified: Secondary | ICD-10-CM | POA: Insufficient documentation

## 2011-07-11 DIAGNOSIS — J449 Chronic obstructive pulmonary disease, unspecified: Secondary | ICD-10-CM | POA: Insufficient documentation

## 2011-07-11 DIAGNOSIS — Z8673 Personal history of transient ischemic attack (TIA), and cerebral infarction without residual deficits: Secondary | ICD-10-CM | POA: Insufficient documentation

## 2011-07-11 DIAGNOSIS — E785 Hyperlipidemia, unspecified: Secondary | ICD-10-CM | POA: Insufficient documentation

## 2011-07-11 DIAGNOSIS — L97509 Non-pressure chronic ulcer of other part of unspecified foot with unspecified severity: Secondary | ICD-10-CM | POA: Insufficient documentation

## 2011-07-11 DIAGNOSIS — K219 Gastro-esophageal reflux disease without esophagitis: Secondary | ICD-10-CM | POA: Insufficient documentation

## 2011-07-11 DIAGNOSIS — Z794 Long term (current) use of insulin: Secondary | ICD-10-CM | POA: Insufficient documentation

## 2011-07-11 DIAGNOSIS — IMO0001 Reserved for inherently not codable concepts without codable children: Secondary | ICD-10-CM | POA: Insufficient documentation

## 2011-07-11 DIAGNOSIS — L03119 Cellulitis of unspecified part of limb: Secondary | ICD-10-CM | POA: Insufficient documentation

## 2011-07-11 DIAGNOSIS — I739 Peripheral vascular disease, unspecified: Secondary | ICD-10-CM | POA: Insufficient documentation

## 2011-07-11 DIAGNOSIS — E1169 Type 2 diabetes mellitus with other specified complication: Secondary | ICD-10-CM | POA: Insufficient documentation

## 2011-07-11 DIAGNOSIS — Z79899 Other long term (current) drug therapy: Secondary | ICD-10-CM | POA: Insufficient documentation

## 2011-07-11 DIAGNOSIS — I10 Essential (primary) hypertension: Secondary | ICD-10-CM | POA: Insufficient documentation

## 2011-07-11 DIAGNOSIS — J4489 Other specified chronic obstructive pulmonary disease: Secondary | ICD-10-CM | POA: Insufficient documentation

## 2011-07-11 LAB — GLUCOSE, CAPILLARY: Glucose-Capillary: 186 mg/dL — ABNORMAL HIGH (ref 70–99)

## 2011-07-15 ENCOUNTER — Telehealth: Payer: Self-pay | Admitting: *Deleted

## 2011-07-15 DIAGNOSIS — M7500 Adhesive capsulitis of unspecified shoulder: Secondary | ICD-10-CM

## 2011-07-15 NOTE — Telephone Encounter (Signed)
Pt calls and states she was to be referred to therapy and has not heard anything, please advise?

## 2011-07-21 ENCOUNTER — Telehealth: Payer: Self-pay | Admitting: *Deleted

## 2011-07-21 NOTE — Telephone Encounter (Signed)
Talked with pt this AM about PT referral - no referral order. Pt states she called PT about Tens unit and has appt.Stanton Kidney Toyna Erisman RN 07/21/11 4PM

## 2011-07-22 NOTE — Telephone Encounter (Signed)
Rolan Lipa I put in the order for PT for patient. Is there anything else I need to do? Thank you Sherlon Handing

## 2011-07-25 ENCOUNTER — Encounter (HOSPITAL_BASED_OUTPATIENT_CLINIC_OR_DEPARTMENT_OTHER): Payer: Medicare Other | Attending: Plastic Surgery

## 2011-07-25 DIAGNOSIS — L02419 Cutaneous abscess of limb, unspecified: Secondary | ICD-10-CM | POA: Insufficient documentation

## 2011-07-25 DIAGNOSIS — J449 Chronic obstructive pulmonary disease, unspecified: Secondary | ICD-10-CM | POA: Insufficient documentation

## 2011-07-25 DIAGNOSIS — Z794 Long term (current) use of insulin: Secondary | ICD-10-CM | POA: Insufficient documentation

## 2011-07-25 DIAGNOSIS — I739 Peripheral vascular disease, unspecified: Secondary | ICD-10-CM | POA: Insufficient documentation

## 2011-07-25 DIAGNOSIS — E785 Hyperlipidemia, unspecified: Secondary | ICD-10-CM | POA: Insufficient documentation

## 2011-07-25 DIAGNOSIS — L03119 Cellulitis of unspecified part of limb: Secondary | ICD-10-CM | POA: Insufficient documentation

## 2011-07-25 DIAGNOSIS — Z8673 Personal history of transient ischemic attack (TIA), and cerebral infarction without residual deficits: Secondary | ICD-10-CM | POA: Insufficient documentation

## 2011-07-25 DIAGNOSIS — J4489 Other specified chronic obstructive pulmonary disease: Secondary | ICD-10-CM | POA: Insufficient documentation

## 2011-07-25 DIAGNOSIS — M069 Rheumatoid arthritis, unspecified: Secondary | ICD-10-CM | POA: Insufficient documentation

## 2011-07-25 DIAGNOSIS — L97509 Non-pressure chronic ulcer of other part of unspecified foot with unspecified severity: Secondary | ICD-10-CM | POA: Insufficient documentation

## 2011-07-25 DIAGNOSIS — IMO0001 Reserved for inherently not codable concepts without codable children: Secondary | ICD-10-CM | POA: Insufficient documentation

## 2011-07-25 DIAGNOSIS — E1169 Type 2 diabetes mellitus with other specified complication: Secondary | ICD-10-CM | POA: Insufficient documentation

## 2011-07-25 DIAGNOSIS — I10 Essential (primary) hypertension: Secondary | ICD-10-CM | POA: Insufficient documentation

## 2011-07-25 DIAGNOSIS — K219 Gastro-esophageal reflux disease without esophagitis: Secondary | ICD-10-CM | POA: Insufficient documentation

## 2011-07-25 DIAGNOSIS — Z79899 Other long term (current) drug therapy: Secondary | ICD-10-CM | POA: Insufficient documentation

## 2011-07-30 ENCOUNTER — Encounter: Payer: Medicare Other | Admitting: Internal Medicine

## 2011-07-30 ENCOUNTER — Ambulatory Visit: Payer: Medicare Other | Admitting: Dietician

## 2011-08-05 ENCOUNTER — Ambulatory Visit (INDEPENDENT_AMBULATORY_CARE_PROVIDER_SITE_OTHER): Payer: Medicare Other | Admitting: Family Medicine

## 2011-08-05 ENCOUNTER — Encounter: Payer: Self-pay | Admitting: Family Medicine

## 2011-08-05 DIAGNOSIS — M7502 Adhesive capsulitis of left shoulder: Secondary | ICD-10-CM

## 2011-08-05 DIAGNOSIS — M25519 Pain in unspecified shoulder: Secondary | ICD-10-CM

## 2011-08-05 DIAGNOSIS — M7500 Adhesive capsulitis of unspecified shoulder: Secondary | ICD-10-CM

## 2011-08-05 NOTE — Progress Notes (Signed)
Subjective:    [name], female    DOB: 12/15/1961, 50 y.o.   MRN: [id number]  HPI  Jennifer [name], a 50 y.o. female presents today in the office for the following:    Follow-up of left shoulder pain and adhesive capsulitis after the patient was lost to follow-up and initially seen by me more than 1 year ago. Had an intraarticular injection x 1 of the left shoulder and went to PT for frozen shoulder rehab, but never followed up.  Now presents with a 3 year history of pain, multiple medical problems including DM on insulin, COPD, CKD, HTN, lipids, with persistent shoulder pain and worsening of ROM in all planes of motion on the left intervally compared to my initial exam.  History is also significant for open healing diabetic ulcer on foot.   Surgical history is significant for prior RTC repair on the right several years ago by an MD at [facility] the patient believes.  Patient Active Problem List  Diagnoses  . DM W/MANIFESTATION NEC, TYPE II, UNCONTROLLED  . HYPERLIPIDEMIA NEC/NOS  . DEPRESSION  . GLAUCOMA  . HYPERTENSION  . COPD  . GASTROESOPHAGEAL REFLUX DISEASE  . FOOT ULCER, RIGHT  . SHOULDER PAIN, LEFT  . DISC DISEASE, LUMBAR  . INSOMNIA  . Adhesive capsulitis of left shoulder   Past Medical History  Diagnosis Date  . Depression   . Diabetes mellitus   . GERD (gastroesophageal reflux disease)   . Hyperlipidemia   . Hypertension   . Chronic kidney disease    No past surgical history on file. History  Substance Use Topics  . Smoking status: Current Everyday Smoker -- 0.5 packs/day for 40 years    Types: Cigarettes  . Smokeless tobacco: Never Used  . Alcohol Use: No   No family history on file. Allergies  Allergen Reactions  . 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 Morphine Sulfate     REACTION: hives   Current Outpatient Prescriptions on File Prior to Visit  Medication Sig  Dispense Refill  . aspirin 81 MG tablet Take 81 mg by mouth daily.        Marland Kitchen atenolol (TENORMIN) 100 MG tablet Take 1 tablet (100 mg total) by mouth daily.  30 tablet  11  . clopidogrel (PLAVIX) 75 MG tablet Take 1 tablet (75 mg total) by mouth daily.  30 tablet  11  . escitalopram (LEXAPRO) 10 MG tablet Take 10 mg by mouth daily.        . Fluticasone-Salmeterol (ADVAIR DISKUS) 250-50 MCG/DOSE AEPB Inhale 1 puff into the lungs 2 (two) times daily.  1 each  5  . hydrochlorothiazide 25 MG tablet TAKE 1 TABLET BY MOUTH EVERY DAY  30 tablet  5  . insulin lispro protamine-insulin lispro (HUMALOG 75/25) (75-25) 100 UNIT/ML SUSP Inject subcutaneously 110 units in the morning and 65 units at night  10 mL  3  . lisinopril (PRINIVIL,ZESTRIL) 20 MG tablet TAKE 1 TABLET BY MOUTH EVERY DAY  31 tablet  4  . pregabalin (LYRICA) 100 MG capsule Take 1 capsule (100 mg total) by mouth 3 (three) times daily.  90 capsule  7  . traMADol (ULTRAM) 50 MG tablet TAKE 1 TABLET BY MOUTH 2-3 TIMES A DAY AS NEEDED FOR PAIN  60 tablet  1  . traZODone (DESYREL) 100 MG tablet Take 100 mg by mouth at bedtime.  Review of Systems REVIEW OF SYSTEMS  GEN: No fevers, chills. Nontoxic. Primarily MSK c/o today. MSK: Detailed in the HPI GI: tolerating PO intake without difficulty Neuro: No numbness, parasthesias, or tingling associated. Otherwise the pertinent positives of the ROS are noted above.      Objective:   Physical Exam   Physical Exam  Blood pressure 138/82, pulse 64, height 5\' 7"  (1.702 m), weight 252 lb (114.306 kg).  GEN: Well-developed,well-nourished,in no acute distress; alert,appropriate and cooperative throughout examination HEENT: Normocephalic and atraumatic without obvious abnormalities. Ears, externally no deformities PULM: Breathing comfortably in no respiratory distress EXT: No clubbing, cyanosis, or edema PSYCH: Normally interactive. Cooperative during the interview. Pleasant. Friendly and  conversant. Not anxious or depressed appearing. Normal, full affect.  Shoulder: LEFT Ecchymosis/edema: neg  AC joint, scapula, clavicle: mild tenderness Cervical spine: NT, full ROM Spurling's: neg Abduction: abduction to 50 degrees, 4+/5 at this limited level Flexion: 60 degrees, 5/5 IE: 5/5 ER 5/5 Essentially no EROM or IROM possible in partial abduction Virtually all special testing equivocal given loss of motion Drop Test: neg Empty Can: neg Supraspinatus insertion: NT Bicipital groove: NT Scapular dyskinesis: y C5-T1 intact Sensation intact Grip 5/5       Assessment & Plan:   1. SHOULDER PAIN, LEFT  Ambulatory referral to Orthopedic Surgery  2. Adhesive capsulitis of left shoulder  Ambulatory referral to Orthopedic Surgery   L shoulder pain, dramatic loss of motion in all planes of motion L shoulder, lost to f/u for > 1year. Failure to improve despite PT, intraarticular injection x 1.   Given length of time, I think appropriate to consult orthopedics to see if they feel manipulation under anesth is appropriate in this case. I appreciate their assistance.

## 2011-08-06 ENCOUNTER — Encounter: Payer: Medicare Other | Admitting: Internal Medicine

## 2011-08-06 NOTE — Progress Notes (Signed)
Patient has appointment at [facility] on Monday August 20th at 2:00. Patient is aware of date and time.

## 2011-08-19 ENCOUNTER — Telehealth: Payer: Self-pay | Admitting: *Deleted

## 2011-08-19 NOTE — Telephone Encounter (Signed)
Pt wants refill on diflucan

## 2011-08-19 NOTE — Telephone Encounter (Signed)
Also, pt states during request for diflucan that she "was messing with my foot and toe the other night and i cut a piece off my toe and my toenail, i been doctoring it myself, what you think?" she is advised to go to urg care or ED for eval of foot, cautioned the dangers since she is a diabetic, she is agreeable and states she will go tomorrow

## 2011-08-20 ENCOUNTER — Telehealth: Payer: Self-pay | Admitting: Dietician

## 2011-08-20 NOTE — Telephone Encounter (Signed)
Left a message with person at her residence. Will try back later.

## 2011-08-22 NOTE — Telephone Encounter (Signed)
Appointment rescheduled.

## 2011-08-23 ENCOUNTER — Other Ambulatory Visit: Payer: Self-pay | Admitting: Internal Medicine

## 2011-08-26 ENCOUNTER — Other Ambulatory Visit: Payer: Self-pay | Admitting: *Deleted

## 2011-08-26 MED ORDER — FLUTICASONE-SALMETEROL 250-50 MCG/DOSE IN AEPB: 1.0000 | INHALATION_SPRAY | Freq: Two times a day (BID) | RESPIRATORY_TRACT | Status: DC

## 2011-08-26 NOTE — Telephone Encounter (Signed)
Last OV 07/04/11.

## 2011-08-27 ENCOUNTER — Ambulatory Visit (INDEPENDENT_AMBULATORY_CARE_PROVIDER_SITE_OTHER): Payer: Medicare Other | Admitting: Internal Medicine

## 2011-08-27 ENCOUNTER — Encounter: Payer: Self-pay | Admitting: Internal Medicine

## 2011-08-27 ENCOUNTER — Ambulatory Visit (HOSPITAL_COMMUNITY)
Admission: RE | Admit: 2011-08-27 | Discharge: 2011-08-27 | Disposition: A | Discharge status: Home / Self Care | Payer: Medicare Other | Source: Ambulatory Visit | Attending: Cardiology | Admitting: Cardiology

## 2011-08-27 ENCOUNTER — Ambulatory Visit (INDEPENDENT_AMBULATORY_CARE_PROVIDER_SITE_OTHER): Payer: Medicare Other | Admitting: Dietician

## 2011-08-27 VITALS — BP 153/79 | HR 74 | Temp 97.3°F | Ht 67.0 in | Wt 250.7 lb

## 2011-08-27 DIAGNOSIS — M25519 Pain in unspecified shoulder: Secondary | ICD-10-CM

## 2011-08-27 DIAGNOSIS — IMO0002 Reserved for concepts with insufficient information to code with codable children: Secondary | ICD-10-CM

## 2011-08-27 DIAGNOSIS — G4700 Insomnia, unspecified: Secondary | ICD-10-CM

## 2011-08-27 DIAGNOSIS — I498 Other specified cardiac arrhythmias: Secondary | ICD-10-CM | POA: Insufficient documentation

## 2011-08-27 DIAGNOSIS — I10 Essential (primary) hypertension: Secondary | ICD-10-CM

## 2011-08-27 DIAGNOSIS — E1165 Type 2 diabetes mellitus with hyperglycemia: Secondary | ICD-10-CM

## 2011-08-27 DIAGNOSIS — Z01810 Encounter for preprocedural cardiovascular examination: Secondary | ICD-10-CM | POA: Insufficient documentation

## 2011-08-27 DIAGNOSIS — I739 Peripheral vascular disease, unspecified: Secondary | ICD-10-CM

## 2011-08-27 DIAGNOSIS — J449 Chronic obstructive pulmonary disease, unspecified: Secondary | ICD-10-CM

## 2011-08-27 DIAGNOSIS — Z9889 Other specified postprocedural states: Secondary | ICD-10-CM | POA: Insufficient documentation

## 2011-08-27 DIAGNOSIS — E1169 Type 2 diabetes mellitus with other specified complication: Secondary | ICD-10-CM

## 2011-08-27 DIAGNOSIS — E1351 Other specified diabetes mellitus with diabetic peripheral angiopathy without gangrene: Secondary | ICD-10-CM | POA: Insufficient documentation

## 2011-08-27 DIAGNOSIS — L97909 Non-pressure chronic ulcer of unspecified part of unspecified lower leg with unspecified severity: Secondary | ICD-10-CM

## 2011-08-27 LAB — CBC WITH DIFFERENTIAL/PLATELET
Basophils Absolute: 0 10*3/uL (ref 0.0–0.1)
Basophils Relative: 0 % (ref 0–1)
Eosinophils Absolute: 0.1 10*3/uL (ref 0.0–0.7)
Eosinophils Relative: 1 % (ref 0–5)
HCT: 43.1 % (ref 36.0–46.0)
Hemoglobin: 14.6 g/dL (ref 12.0–15.0)
Lymphocytes Relative: 48 % — ABNORMAL HIGH (ref 12–46)
Lymphs Abs: 4.3 10*3/uL — ABNORMAL HIGH (ref 0.7–4.0)
MCH: 32.7 pg (ref 26.0–34.0)
MCHC: 33.9 g/dL (ref 30.0–36.0)
MCV: 96.4 fL (ref 78.0–100.0)
Monocytes Absolute: 0.4 10*3/uL (ref 0.1–1.0)
Monocytes Relative: 5 % (ref 3–12)
Neutro Abs: 4.2 10*3/uL (ref 1.7–7.7)
Neutrophils Relative %: 46 % (ref 43–77)
Platelets: 153 10*3/uL (ref 150–400)
RBC: 4.47 MIL/uL (ref 3.87–5.11)
RDW: 14 % (ref 11.5–15.5)
WBC: 9 10*3/uL (ref 4.0–10.5)

## 2011-08-27 LAB — GLUCOSE, CAPILLARY: Glucose-Capillary: 145 mg/dL — ABNORMAL HIGH (ref 70–99)

## 2011-08-27 LAB — BASIC METABOLIC PANEL
BUN: 13 mg/dL (ref 6–23)
CO2: 28 mEq/L (ref 19–32)
Calcium: 9.3 mg/dL (ref 8.4–10.5)
Chloride: 102 mEq/L (ref 96–112)
Creat: 0.69 mg/dL (ref 0.50–1.10)
Glucose, Bld: 220 mg/dL — ABNORMAL HIGH (ref 70–99)
Potassium: 4.1 mEq/L (ref 3.5–5.3)
Sodium: 139 mEq/L (ref 135–145)

## 2011-08-27 LAB — POCT GLYCOSYLATED HEMOGLOBIN (HGB A1C): Hemoglobin A1C: 9.9

## 2011-08-27 MED ORDER — INSULIN LISPRO PROT & LISPRO (75-25 MIX) 100 UNIT/ML ~~LOC~~ SUSP: SUBCUTANEOUS | Status: DC

## 2011-08-27 MED ORDER — ALBUTEROL SULFATE HFA 108 (90 BASE) MCG/ACT IN AERS: 2.0000 | INHALATION_SPRAY | Freq: Four times a day (QID) | RESPIRATORY_TRACT | Status: DC | PRN

## 2011-08-27 MED ORDER — ESCITALOPRAM OXALATE 10 MG PO TABS: 10.0000 mg | ORAL_TABLET | Freq: Every day | ORAL | Status: DC

## 2011-08-27 MED ORDER — LISINOPRIL 20 MG PO TABS: 20.0000 mg | ORAL_TABLET | Freq: Every day | ORAL | Status: DC

## 2011-08-27 NOTE — Patient Instructions (Signed)
Good luck with your surgery.  Remember what makes blood sugar go UP:  1- Having diabetes  2- All CARBS- sugar like in regular soda, fruit, milk, starches, rice, pasta, bread,cereal,starchy     veggies like potatoes, corn  3- Stress- acute like surgery or flu, chronic stress usually makes [place] distracted so we do not do the  things to take care of ourselves like exercise, take meds, take blood sugar and eat healthy     And What makes blood sugar go DOWN:  1- Water  2- Eating less carbs  3- Physical activity  4- Medications- sometimes we need extra insulin (extra help)- don't be afraid to ask for it- a fast     acting insulin such as Novolog or Humalog or Apidra can help correct high blood     sugars during illness when it is hard to eat healthy and exercise.

## 2011-08-27 NOTE — Progress Notes (Signed)
Medical Nutrition Therapy:  Appt start time: 1330 end time:  1415.  Assessment:  Primary concerns today: Blood sugar control Usual eating pattern includes Meal 3 and 2+ snacks per day.   Ate fried chicken, dirty rice from home and regular soda for lunch today. 2 hour postmeal blood sugar was 341. Jennifer [name] has not been checking blood sugar as often as she would like to see herself checking. Does take insulin. Unaware of portions  of foods that affect blood sugars. Avoided foods include: none really at this point. intake reported receals few fruit, vegetables.   Usual physical activity includes very little at this time. Awaiting rotator cuff surgery    Progress Towards Goal(s):  In progress   Nutritional Diagnosis:  NI-5.8.2 Excessive carbohydrate intake As related to lack of prior exposure to portions and hwo to control portions.  As evidenced by her report of no knowledge of portion sizes.  Interventions: 1- Education and counseling about what and how much different foods raise blood sugar. 2- Education about what is a portions size and it's affect on blood sugar control. 3- Education and training on how to use blood sugar meter.  4- Social support provided   Monitoring/Evaluation:  Dietary intake in 6 week(s)

## 2011-08-27 NOTE — Progress Notes (Signed)
Subjective:   [name] female   DOB: June 08, 1961 50 y.o.   MRN: [id number]  HPI: [name] is a 50 y.o. woman who presents today for surgical clearance.  She is set to have a left rotator cuff surgery on 9/14 by Dr. Dion Saucier.  She previously had her right shoulder repaired in 2005.    She has a PMH significant for diabetes, hypertension, COPD, depression, and HLD.  She is insulin dependent and takes 75/25 120 units with breakfast and 65 units at supper.  She brought her meter with today and we reviewed her blood sugars together.  She has not been checking her blood sugars very often and all have been in the hyperglycemic range.  She is due for an A1C today.  She is on Plavix for PVD s/p stenting on her right common Iliac artery and angioplasty of the left common iliac artery.  She has no current claudication symptoms but is limited in her activity by back pain.  She is able to climb a flight of stairs having to stop because of her back pain.  She has no complaints of SOB or DOE.   She states that her son tried to pop a "blackhead" about a month ago and now she noticed that "something is growing."  She states that it hurts when she scratches it or if her shirt catches on it.  She denies any pain in the area if it is just left alone.  She has no history of skin cancer.    She has also been dealing with diabetic ulcers on her right leg and foot.  She states that she has been to the wound center for one that they were watching for several months.  Her last visit was 3-4 weeks ago.  Currently she has several healing wounds on her toes and top of her foot from an ill fitting pair of shoes.  She also has an open area on her lateral lower leg just above the ankle that has been there for 3 months and she states that it has been not healing.  She denies any fevers, chills, nausea, vomiting, redness, warmth, white or creamy discharge, or pain over the ulceration.    Past Medical History    Diagnosis Date  . Depression   . Diabetes mellitus   . GERD (gastroesophageal reflux disease)   . Hyperlipidemia   . Hypertension   . Chronic kidney disease    Current Outpatient Prescriptions  Medication Sig Dispense Refill  . aspirin 81 MG tablet Take 81 mg by mouth daily.        Marland Kitchen atenolol (TENORMIN) 100 MG tablet Take 1 tablet (100 mg total) by mouth daily.  30 tablet  11  . clopidogrel (PLAVIX) 75 MG tablet Take 1 tablet (75 mg total) by mouth daily.  30 tablet  11  . escitalopram (LEXAPRO) 10 MG tablet Take 10 mg by mouth daily.        . Fluticasone-Salmeterol (ADVAIR DISKUS) 250-50 MCG/DOSE AEPB Inhale 1 puff into the lungs 2 (two) times daily.  60 each  5  . hydrochlorothiazide 25 MG tablet TAKE 1 TABLET BY MOUTH EVERY DAY  30 tablet  5  . insulin lispro protamine-insulin lispro (HUMALOG 75/25) (75-25) 100 UNIT/ML SUSP Inject subcutaneously 110 units in the morning and 65 units at night  10 mL  3  . lisinopril (PRINIVIL,ZESTRIL) 20 MG tablet TAKE 1 TABLET BY MOUTH EVERY DAY  31 tablet  4  .  pregabalin (LYRICA) 100 MG capsule Take 1 capsule (100 mg total) by mouth 3 (three) times daily.  90 capsule  7  . traMADol (ULTRAM) 50 MG tablet TAKE 1 TABLET BY MOUTH 2-3 TIMES A DAY AS NEEDED FOR PAIN  60 tablet  1  . traZODone (DESYREL) 100 MG tablet Take 100 mg by mouth at bedtime.         No family history on file. History   Social History  . Marital Status: Single    Spouse Name: N/A    Number of Children: N/A  . Years of Education: N/A   Social History Main Topics  . Smoking status: Current Everyday Smoker -- 0.5 packs/day for 40 years    Types: Cigarettes  . Smokeless tobacco: Never Used  . Alcohol Use: No  . Drug Use: No  . Sexually Active: None   Other Topics Concern  . None   Social History Narrative  . None   Review of Systems: Constitutional: Denies fever, chills, diaphoresis, appetite change and fatigue.  HEENT: Denies photophobia, eye pain, redness, hearing  loss, ear pain, congestion, sore throat, rhinorrhea, sneezing, mouth sores, trouble swallowing, neck pain, neck stiffness and tinnitus.   Respiratory: Denies SOB, DOE, cough, chest tightness,  and wheezing.   Cardiovascular: Denies chest pain, palpitations and leg swelling.  Gastrointestinal: Denies nausea, vomiting, abdominal pain, diarrhea, constipation, blood in stool and abdominal distention.  Genitourinary: Denies dysuria, urgency, frequency, hematuria, flank pain and difficulty urinating.  Musculoskeletal: Denies myalgias, back pain, joint swelling, arthralgias and gait problem.  Skin: Denies pallor, rash and wound.  Neurological: Denies dizziness, seizures, syncope, weakness, light-headedness, numbness and headaches.  Hematological: Denies adenopathy. Easy bruising, personal or family bleeding history  Psychiatric/Behavioral: Denies suicidal ideation, mood changes, confusion, nervousness, sleep disturbance and agitation  Objective:  Physical Exam: Filed Vitals:   08/27/11 1040  BP: 153/79  Pulse: 74  Temp: 97.3 F (36.3 C)  TempSrc: Oral  Height: 5\' 7"  (1.702 m)  Weight: 250 lb 11.4 oz (113.721 kg)   Constitutional: Vital signs reviewed.  Patient is an obese, well-developed, and well-nourished woman in no acute distress and cooperative with exam. Alert and oriented x3.  Head: Normocephalic and atraumatic Ear: TM normal bilaterally Mouth: no erythema or exudates, MMM Eyes: PERRL, EOMI, conjunctivae normal, No scleral icterus.  Neck: Supple, Trachea midline normal ROM, No JVD, mass, thyromegaly, or carotid bruit present.  Cardiovascular: RRR, S1 normal, S2 normal, no MRG, pulses symmetric and intact bilaterally Pulmonary/Chest: CTAB, no wheezes, rales, or rhonchi Abdominal: Soft. Non-tender, non-distended, bowel sounds are normal, no masses, organomegaly, or guarding present.  GU: no CVA tenderness Musculoskeletal: No joint deformities, erythema, or stiffness, ROM full and no  nontender Hematology: no cervical, inginal, or axillary adenopathy.  Neurological: A&O x3, Strength is normal and symmetric bilaterally, cranial nerve II-XII are grossly intact, no focal motor deficit, sensory intact to light touch bilaterally.  Skin: Warm and dry.  There are several ulcerations in various states of healing on her right lower leg and foot.  The largest is 2 cm by 2 cm and is a stage II ulceration with no exudate, slough, or surrounding erythema.  No rash, cyanosis, or clubbing.  Psychiatric: Normal mood and affect. speech and behavior is normal. Judgment and thought content normal. Cognition and memory are normal.   Assessment & Plan:

## 2011-08-27 NOTE — Patient Instructions (Addendum)
It is okay to stop your Plavix on Friday before your surgery.  You should restart it as soon as your surgery is done.    If there is anything from your lab work to follow up on we will call you.  You can use Neosporin or antibiotic ointment on your wound on your leg and keep it bandaged.  If it gets red and warm around it or you develop a fever above 101 you need to call us.  Make an appointment after your surgery to have the spot on your back removed.

## 2011-08-29 ENCOUNTER — Encounter: Payer: Self-pay | Admitting: Dietician

## 2011-08-29 NOTE — Progress Notes (Signed)
done

## 2011-08-29 NOTE — Assessment & Plan Note (Signed)
BP Readings from Last 3 Encounters:  08/27/11 153/79  08/05/11 138/82  07/04/11 143/75   Blood pressure today is similar to previous but mildly elevated.  We will continue to monitor and adjust her medications as needed.

## 2011-08-29 NOTE — Assessment & Plan Note (Signed)
She is scheduled for left shoulder surgery next week.  EKG shows a 1st degree AV block that is likely from beta blocker and is otherwise unchanged from her previous tracings.  Her Diabetes is mildly out of control but she is on insulin and didn't bring her meter with today.  She has no complaints of SOB or chest pain and is able to walk a flight of stairs stopping only because of her back pain.    CBC:    Component Value Date/Time   WBC 9.0 08/27/2011 1214   HGB 14.6 08/27/2011 1214   HCT 43.1 08/27/2011 1214   PLT 153 08/27/2011 1214   MCV 96.4 08/27/2011 1214   NEUTROABS 4.2 08/27/2011 1214   LYMPHSABS 4.3* 08/27/2011 1214   MONOABS 0.4 08/27/2011 1214   EOSABS 0.1 08/27/2011 1214   BASOSABS 0.0 08/27/2011 1214   Basic Metabolic Panel:    Component Value Date/Time   NA 139 08/27/2011 1214   K 4.1 08/27/2011 1214   CL 102 08/27/2011 1214   CO2 28 08/27/2011 1214   BUN 13 08/27/2011 1214   CREATININE 0.69 08/27/2011 1214   CREATININE 0.67 02/07/2010 1916   GLUCOSE 220* 08/27/2011 1214   CALCIUM 9.3 08/27/2011 1214   Labs are WNL so she is cleared for surgery from my stand point.

## 2011-08-29 NOTE — Assessment & Plan Note (Signed)
She has several ulcerations on her right foot that are from a pair of poorly fitting shoes.  We will have her [place] a bacitracin ointment to prevent infection and keep it covered to help with healing.  She was counseled to be careful scratching her legs and also to be sure to wear comfortable shoes.

## 2011-08-29 NOTE — Assessment & Plan Note (Addendum)
Lab Results  Component Value Date   HGBA1C 9.9 08/27/2011   HGBA1C 9.0 05/02/2011   HGBA1C 10.6 11/05/2010   Lab Results  Component Value Date   MICROALBUR 0.50 09/14/2009   LDLCALC 109* 02/07/2010   CREATININE 0.69 08/27/2011   A1C is mildly increased from previously.  She did not bring her monitor in today so I'm hard pressed to change her insulin currently.  She is currently on 120 in the AM and 65 in the PM of 75/25.  I encouraged her to continue checking her blood sugar and to bring her meter to her next appointment.

## 2011-08-29 NOTE — Assessment & Plan Note (Signed)
Her PVD is symptom free at this time.  She is on Plavix to maintain stent patency.  She has been on it for several years and it should be okay to stop the Plavix for 7 days before surgery with the plan to restart it after surgery.

## 2011-08-29 NOTE — Assessment & Plan Note (Signed)
Currently well controlled.  No complaints today.

## 2011-08-29 NOTE — Assessment & Plan Note (Signed)
Due for refills today and is doing well.  We will continue to monitor.

## 2011-08-29 NOTE — Progress Notes (Signed)
Addended by: Baird Cancer on: 08/29/2011 09:44 AM   Modules accepted: Orders

## 2011-09-02 ENCOUNTER — Telehealth: Payer: Self-pay | Admitting: Licensed Clinical Social Worker

## 2011-09-02 NOTE — Telephone Encounter (Signed)
Pt calls and states she is having shoulder surg and needs a recliner delivered to her home because she does not have one, would the surgeon need to order this?

## 2011-09-03 NOTE — Telephone Encounter (Signed)
Have spoken to Jennifer [name]. And also got [facility] input, will continue to work on this

## 2011-09-04 ENCOUNTER — Encounter (HOSPITAL_BASED_OUTPATIENT_CLINIC_OR_DEPARTMENT_OTHER)
Admission: RE | Admit: 2011-09-04 | Discharge: 2011-09-04 | Disposition: A | Discharge status: Home / Self Care | Payer: Medicare Other | Source: Ambulatory Visit | Attending: Orthopedic Surgery | Admitting: Orthopedic Surgery

## 2011-09-04 ENCOUNTER — Other Ambulatory Visit: Payer: Self-pay | Admitting: Orthopedic Surgery

## 2011-09-04 ENCOUNTER — Ambulatory Visit
Admission: RE | Admit: 2011-09-04 | Discharge: 2011-09-04 | Disposition: A | Discharge status: Home / Self Care | Payer: Medicare Other | Source: Ambulatory Visit | Attending: Orthopedic Surgery | Admitting: Orthopedic Surgery

## 2011-09-04 DIAGNOSIS — Z01811 Encounter for preprocedural respiratory examination: Secondary | ICD-10-CM

## 2011-09-04 LAB — BASIC METABOLIC PANEL
BUN: 10 mg/dL (ref 6–23)
CO2: 28 mEq/L (ref 19–32)
Calcium: 9.2 mg/dL (ref 8.4–10.5)
Chloride: 100 mEq/L (ref 96–112)
Creatinine, Ser: 0.67 mg/dL (ref 0.50–1.10)
GFR calc Af Amer: >60 mL/min (ref >60)
GFR calc non Af Amer: >60 mL/min (ref >60)
Glucose, Bld: 236 mg/dL — ABNORMAL HIGH (ref 70–99)
Potassium: 3.9 mEq/L (ref 3.5–5.1)
Sodium: 137 mEq/L (ref 135–145)

## 2011-09-05 ENCOUNTER — Ambulatory Visit (HOSPITAL_BASED_OUTPATIENT_CLINIC_OR_DEPARTMENT_OTHER)
Admission: RE | Admit: 2011-09-05 | Discharge: 2011-09-05 | Disposition: A | Discharge status: Home / Self Care | Payer: Medicare Other | Source: Ambulatory Visit | Attending: Orthopedic Surgery | Admitting: Orthopedic Surgery

## 2011-09-05 DIAGNOSIS — M7500 Adhesive capsulitis of unspecified shoulder: Secondary | ICD-10-CM | POA: Insufficient documentation

## 2011-09-05 DIAGNOSIS — M25819 Other specified joint disorders, unspecified shoulder: Secondary | ICD-10-CM | POA: Insufficient documentation

## 2011-09-05 DIAGNOSIS — M67919 Unspecified disorder of synovium and tendon, unspecified shoulder: Secondary | ICD-10-CM | POA: Insufficient documentation

## 2011-09-05 DIAGNOSIS — K219 Gastro-esophageal reflux disease without esophagitis: Secondary | ICD-10-CM | POA: Insufficient documentation

## 2011-09-05 DIAGNOSIS — I10 Essential (primary) hypertension: Secondary | ICD-10-CM | POA: Insufficient documentation

## 2011-09-05 DIAGNOSIS — Z01812 Encounter for preprocedural laboratory examination: Secondary | ICD-10-CM | POA: Insufficient documentation

## 2011-09-05 DIAGNOSIS — F17200 Nicotine dependence, unspecified, uncomplicated: Secondary | ICD-10-CM | POA: Insufficient documentation

## 2011-09-05 DIAGNOSIS — Z01818 Encounter for other preprocedural examination: Secondary | ICD-10-CM | POA: Insufficient documentation

## 2011-09-05 DIAGNOSIS — E119 Type 2 diabetes mellitus without complications: Secondary | ICD-10-CM | POA: Insufficient documentation

## 2011-09-05 DIAGNOSIS — M719 Bursopathy, unspecified: Secondary | ICD-10-CM | POA: Insufficient documentation

## 2011-09-05 LAB — POCT HEMOGLOBIN-HEMACUE: Hemoglobin: 13.9 g/dL (ref 12.0–15.0)

## 2011-09-05 LAB — GLUCOSE, CAPILLARY
Glucose-Capillary: 242 mg/dL — ABNORMAL HIGH (ref 70–99)
Glucose-Capillary: 238 mg/dL — ABNORMAL HIGH (ref 70–99)
Glucose-Capillary: 316 mg/dL — ABNORMAL HIGH (ref 70–99)
Glucose-Capillary: 264 mg/dL — ABNORMAL HIGH (ref 70–99)

## 2011-09-06 LAB — GLUCOSE, CAPILLARY
Glucose-Capillary: 322 mg/dL — ABNORMAL HIGH (ref 70–99)
Glucose-Capillary: 386 mg/dL — ABNORMAL HIGH (ref 70–99)
Glucose-Capillary: 325 mg/dL — ABNORMAL HIGH (ref 70–99)

## 2011-09-09 NOTE — Telephone Encounter (Signed)
I consulted with Jennifer [name] at [facility] who said that recliner/lift chair was not covered under medicare or medicaid and chairs run from $400 and up.

## 2011-09-09 NOTE — Telephone Encounter (Signed)
Jennifer [name] has morning appmts on 10/8 and 10/9 and so I will provide smoking cessation information to her on 9/8 after she visits with [name].

## 2011-09-09 NOTE — Telephone Encounter (Signed)
Message was left on 9/11 for Jennifer [name] to call [facility]. Work and to date no c/b.  She has appmts on 10/8 and 10/9 and will visit with her then.

## 2011-09-10 ENCOUNTER — Telehealth: Payer: Self-pay | Admitting: Dietician

## 2011-09-11 NOTE — Op Note (Signed)
NAME:  Jennifer [name], CHERVENAK NO.:  [id number]  MEDICAL RECORD NO.:  [id number]  LOCATION:                                 FACILITY:  PHYSICIAN:  Eulas Post, MD    DATE OF BIRTH:  May 11, 1961  DATE OF PROCEDURE:  09/05/2011 DATE OF DISCHARGE:                              OPERATIVE REPORT   ATTENDING SURGEON:  Eulas Post, MD.  FIRST ASSISTANT:  Janace Litten, orthopedic PA-C  PREOPERATIVE DIAGNOSES:  Left shoulder high-grade anterior supraspinatus tear, impingement syndrome.  POSTOPERATIVE DIAGNOSES: 1. Left shoulder 90% bursal surface rotator cuff tear. 2. Adhesive capsulitis. 3. Impingement syndrome.  OPERATIVE PROCEDURE:  Left shoulder arthroscopy with rotator cuff repair and acromioplasty and manipulation under anesthesia.  ESTIMATED BLOOD LOSS:  Minimal.  OPERATIVE IMPLANTS:  An Arthrex SwiveLock x1 with an inverted FiberTape.  PREOPERATIVE INDICATIONS:  [name] is a 50 year old woman who has multiple medical problems including morbid obesity, diabetes, poorly controlled hypertension, among others and had the left shoulder pain. She elected for surgical management.  The risks, benefits, and alternatives were discussed with her before the procedure including but not limited to risks of infection, bleeding, nerve injury, recurrent shoulder pain, incomplete relief of pain, progression of rotator cuff arthropathy and rotator cuff disease, the need for revision surgery, stiffness, cardiopulmonary complications, among others and she is willing to proceed.  OPERATIVE FINDINGS:  She had 0-140 degrees of forward flexion under exam under anesthesia.  Manipulation yielded palpable lysis of adhesions. Diagnostic arthroscopy demonstrated an intact subscapularis and intact infraspinatus and from the articular side the joint capsule appeared pristine.  The biceps tendon and its pulley were normal.  The labrum was slightly frayed.  There was  diffuse red synovitic changes consistent with adhesive capsulitis.  The manipulation yielded a linear release in the inferior capsule.  The subacromial space was quite striking.  There was mild subacromial spurring, and the most significant finding was that the supraspinatus was torn about 90%.  There was a fairly thick tendon, although the capsule was still attached.  The [facility] joint did not appear hypertrophic, or synovitic, and therefore I did not perform a distal clavicle resection.  OPERATIVE PROCEDURE:  The patient was brought to the operating room and placed in supine position.  General anesthesia was administered.  She was turned in the semilateral decubitus position.  She had already received a regional block.  IV antibiotics were given.  Left upper extremity was manipulated with the above-named findings.  It was then prepped and draped in the usual sterile fashion.  Diagnostic arthroscopy was carried out with the above-named findings.  I used the arthroscopic shaver to debride the labrum, as well as used the arthroscopic ArthroCare to remove some of the synovitis and released some of the tissue in the rotator interval.  I then turned the attention to the subacromial space.  I performed a complete bursectomy, and found the torn supraspinatus tendon.  Given her size, it was fairly challenging to get the correct angles arthroscopically, and so multiple portals were required.  I prepared the tuberosities first with the ArthroCare and then with a bur and took down the capsular  portion of the supraspinatus.  I then used a curved 45- degree corkscrew to the right in order to pass inverted FiberTape through the rotator cuff.  I then placed a superior cannula, and then delivered the inverted FiberTape through the superior cannula into a SwiveLock and then secured the anterior rotator cuff in this fashion. Excellent apposition of the tendon back to the bone was achieved.  I then  released the CA ligament and performed a very light acromioplasty.  As indicated above, the distal clavicle capsule was not violated, however, there was fairly minimal spurring and therefore I did not perform a distal clavicle resection.  The arthroscopic instruments were then removed and portals were closed with Monocryl with Steri- Strips and sterile gauze.  Janace Litten, orthopedic PA-C was present and scrubbed throughout the case and critical for assistance with instrumentation as well as closure.     Eulas Post, MD     JPL/MEDQ  D:  09/05/2011  T:  09/05/2011  Job:  [id number]  Electronically Signed by Teryl Lucy MD on 09/11/2011 11:01:28 AM

## 2011-09-15 NOTE — Telephone Encounter (Signed)
Assisted patient in scheduling follow up appoitnement

## 2011-09-16 LAB — URINALYSIS, ROUTINE W REFLEX MICROSCOPIC
Bilirubin Urine: NEGATIVE
Glucose, UA: >1000 — AB
Hgb urine dipstick: NEGATIVE
Ketones, ur: NEGATIVE
Leukocytes, UA: NEGATIVE
Nitrite: NEGATIVE
Protein, ur: NEGATIVE
Specific Gravity, Urine: 1.027
Urobilinogen, UA: 1
pH: 6

## 2011-09-16 LAB — DIFFERENTIAL
Basophils Absolute: 0.1
Basophils Relative: 1
Eosinophils Absolute: 0.1
Eosinophils Relative: 1
Lymphocytes Relative: 44
Lymphs Abs: 5 — ABNORMAL HIGH
Monocytes Absolute: 0.5
Monocytes Relative: 4
Neutro Abs: 5.6
Neutrophils Relative %: 50

## 2011-09-16 LAB — CBC
HCT: 40.3
HCT: 43.5
Hemoglobin: 14
Hemoglobin: 15.2 — ABNORMAL HIGH
MCHC: 34.8
MCHC: 35
MCV: 96
MCV: 96.4
Platelets: 161
Platelets: 169
RBC: 4.18
RBC: 4.53
RDW: 14
RDW: 14.4
WBC: 11.3 — ABNORMAL HIGH
WBC: 9.7

## 2011-09-16 LAB — WOUND CULTURE: Gram Stain: NONE SEEN

## 2011-09-16 LAB — COMPREHENSIVE METABOLIC PANEL
ALT: 36 — ABNORMAL HIGH
AST: 28
Albumin: 3.6
Alkaline Phosphatase: 92
BUN: 16
CO2: 25
Calcium: 9.6
Chloride: 102
Creatinine, Ser: 0.64
GFR calc Af Amer: >60
GFR calc non Af Amer: >60
Glucose, Bld: 79
Potassium: 3.7
Sodium: 138
Total Bilirubin: 0.5
Total Protein: 7

## 2011-09-16 LAB — CULTURE, BLOOD (ROUTINE X 2)
Culture: NO GROWTH
Culture: NO GROWTH

## 2011-09-16 LAB — BASIC METABOLIC PANEL
BUN: 19
CO2: 26
Calcium: 8.7
Chloride: 99
Creatinine, Ser: 0.87
GFR calc Af Amer: >60
GFR calc non Af Amer: >60
Glucose, Bld: 227 — ABNORMAL HIGH
Potassium: 3.7
Sodium: 135

## 2011-09-16 LAB — URINE MICROSCOPIC-ADD ON

## 2011-09-16 LAB — URINE CULTURE
Colony Count: NO GROWTH
Culture: NO GROWTH

## 2011-09-16 LAB — INFLUENZA A+B VIRUS AG-DIRECT(RAPID)
Inflenza A Ag: NEGATIVE
Influenza B Ag: NEGATIVE

## 2011-09-16 LAB — LIPID PANEL
Cholesterol: 167
HDL: 31 — ABNORMAL LOW
LDL Cholesterol: 108 — ABNORMAL HIGH
Total CHOL/HDL Ratio: 5.4
Triglycerides: 138
VLDL: 28

## 2011-09-16 LAB — SEDIMENTATION RATE: Sed Rate: 33 — ABNORMAL HIGH

## 2011-09-17 ENCOUNTER — Other Ambulatory Visit: Payer: Self-pay | Admitting: Internal Medicine

## 2011-09-22 LAB — GLUCOSE, CAPILLARY: Glucose-Capillary: 66 — ABNORMAL LOW

## 2011-09-23 LAB — GLUCOSE, CAPILLARY: Glucose-Capillary: 197 — ABNORMAL HIGH

## 2011-09-25 LAB — GLUCOSE, CAPILLARY: Glucose-Capillary: 261 mg/dL — ABNORMAL HIGH (ref 70–99)

## 2011-09-29 ENCOUNTER — Ambulatory Visit: Payer: Medicare Other | Admitting: Dietician

## 2011-09-30 ENCOUNTER — Encounter: Payer: Self-pay | Admitting: Licensed Clinical Social Worker

## 2011-09-30 ENCOUNTER — Encounter: Payer: Medicare Other | Admitting: Internal Medicine

## 2011-09-30 ENCOUNTER — Ambulatory Visit: Payer: Medicare Other | Admitting: Dietician

## 2011-09-30 LAB — BASIC METABOLIC PANEL
BUN: 9
CO2: 28
Calcium: 8.4
Chloride: 101
Creatinine, Ser: 0.64
GFR calc Af Amer: >60
GFR calc non Af Amer: >60
Glucose, Bld: 113 — ABNORMAL HIGH
Potassium: 3.5
Sodium: 136

## 2011-09-30 LAB — CBC
HCT: 35.9 — ABNORMAL LOW
Hemoglobin: 12.2
MCHC: 34.1
MCV: 95
Platelets: 142 — ABNORMAL LOW
RBC: 3.78 — ABNORMAL LOW
RDW: 13.8
WBC: 7.8

## 2011-09-30 NOTE — Progress Notes (Signed)
Patient has again cancelled her doctor appmt this morning.  Jennifer [name] has also recently had surgery.  I will send Jennifer [name] smoking cessation information including tip sheet and 1-800 quit line so she has the option to connect w/ telephone counseling rather than coming in/encourage her to visit w/ me when she is ready.

## 2011-10-01 LAB — COMPREHENSIVE METABOLIC PANEL
ALT: 26
AST: 26
Albumin: 3.3 — ABNORMAL LOW
Alkaline Phosphatase: 75
BUN: 7
CO2: 29
Calcium: 9.2
Chloride: 106
Creatinine, Ser: 0.7
GFR calc Af Amer: >60
GFR calc non Af Amer: >60
Glucose, Bld: 257 — ABNORMAL HIGH
Potassium: 3.8
Sodium: 141
Total Bilirubin: 0.3
Total Protein: 6.2

## 2011-10-01 LAB — CBC
HCT: 38.4
Hemoglobin: 13.1
MCHC: 34.1
MCV: 94.7
Platelets: 154
RBC: 4.05
RDW: 13.8
WBC: 7.9

## 2011-10-01 LAB — PROTIME-INR
INR: 1.1
Prothrombin Time: 14.2

## 2011-10-01 LAB — APTT: aPTT: 31

## 2011-10-02 LAB — CSF CULTURE W GRAM STAIN
Culture: NO GROWTH
Gram Stain: NONE SEEN

## 2011-10-02 LAB — CSF CELL COUNT WITH DIFFERENTIAL
RBC Count, CSF: 0
Tube #: 3
WBC, CSF: 1

## 2011-10-02 LAB — I-STAT 8, (EC8 V) (CONVERTED LAB)
Acid-Base Excess: 2
BUN: 13
Bicarbonate: 28 — ABNORMAL HIGH
Chloride: 105
Glucose, Bld: 159 — ABNORMAL HIGH
HCT: 43
Hemoglobin: 14.6
Operator id: 270111
Potassium: 3.8
Sodium: 141
TCO2: 29
pCO2, Ven: 46.1
pH, Ven: 7.39 — ABNORMAL HIGH

## 2011-10-02 LAB — CBC
HCT: 40.6
Hemoglobin: 13.5
MCHC: 33.3
MCV: 97.1
Platelets: 168
RBC: 4.18
RDW: 13.6
WBC: 9.8

## 2011-10-02 LAB — DIFFERENTIAL
Basophils Absolute: 0.1
Basophils Relative: 1
Eosinophils Absolute: 0.1
Eosinophils Relative: 1
Lymphocytes Relative: 55 — ABNORMAL HIGH
Lymphs Abs: 5.4 — ABNORMAL HIGH
Monocytes Absolute: 0.4
Monocytes Relative: 4
Neutro Abs: 3.8
Neutrophils Relative %: 39 — ABNORMAL LOW

## 2011-10-02 LAB — URINALYSIS, ROUTINE W REFLEX MICROSCOPIC
Bilirubin Urine: NEGATIVE
Glucose, UA: >1000 — AB
Hgb urine dipstick: NEGATIVE
Ketones, ur: NEGATIVE
Leukocytes, UA: NEGATIVE
Nitrite: NEGATIVE
Protein, ur: NEGATIVE
Specific Gravity, Urine: 1.035 — ABNORMAL HIGH
Urobilinogen, UA: 1
pH: 6

## 2011-10-02 LAB — OLIGOCLONAL BANDS, CSF + SERM
Albumin Index: 6.8 ratio (ref 0.0–9.0)
Albumin, CSF: 22 mg/dL (ref 0–35)
Albumin, Serum(Neph): 3240 mg/dL — ABNORMAL LOW (ref 3500–5200)
CSF Oligoclonal Bands: NEGATIVE
IgG Index, CSF: 0.54 ratio (ref 0.28–0.66)
IgG, CSF: 3.4 mg/dL (ref 0.0–6.0)
IgG, Serum: 935 mg/dL (ref 768–1632)
IgG/Albumin Ratio, CSF: 0.15 ratio (ref 0.09–0.25)
MS CNS IgG Synthesis Rate: 0 mg/d (ref 0.0–8.0)

## 2011-10-02 LAB — POCT I-STAT CREATININE
Creatinine, Ser: 0.7
Operator id: 270111

## 2011-10-02 LAB — URINE MICROSCOPIC-ADD ON

## 2011-10-02 LAB — PROTEIN AND GLUCOSE, CSF
Glucose, CSF: 58
Total  Protein, CSF: 38

## 2011-10-09 ENCOUNTER — Ambulatory Visit (INDEPENDENT_AMBULATORY_CARE_PROVIDER_SITE_OTHER): Payer: Medicare Other | Admitting: Internal Medicine

## 2011-10-09 ENCOUNTER — Ambulatory Visit (INDEPENDENT_AMBULATORY_CARE_PROVIDER_SITE_OTHER): Payer: Medicare Other | Admitting: Dietician

## 2011-10-09 VITALS — BP 119/69 | HR 69 | Temp 97.3°F | Ht 67.0 in | Wt 256.0 lb

## 2011-10-09 VITALS — Ht 67.0 in | Wt 256.7 lb

## 2011-10-09 DIAGNOSIS — L0291 Cutaneous abscess, unspecified: Secondary | ICD-10-CM

## 2011-10-09 DIAGNOSIS — E1165 Type 2 diabetes mellitus with hyperglycemia: Secondary | ICD-10-CM

## 2011-10-09 DIAGNOSIS — E1169 Type 2 diabetes mellitus with other specified complication: Secondary | ICD-10-CM

## 2011-10-09 DIAGNOSIS — L0390 Cellulitis, unspecified: Secondary | ICD-10-CM | POA: Insufficient documentation

## 2011-10-09 DIAGNOSIS — IMO0002 Reserved for concepts with insufficient information to code with codable children: Secondary | ICD-10-CM

## 2011-10-09 DIAGNOSIS — E785 Hyperlipidemia, unspecified: Secondary | ICD-10-CM

## 2011-10-09 LAB — LIPID PANEL
Cholesterol: 150 mg/dL (ref 0–200)
HDL: 39 mg/dL — ABNORMAL LOW (ref >39)
LDL Cholesterol: 90 mg/dL (ref 0–99)
Total CHOL/HDL Ratio: 3.8 Ratio
Triglycerides: 106 mg/dL (ref <150)
VLDL: 21 mg/dL (ref 0–40)

## 2011-10-09 MED ORDER — GLUCOSE BLOOD VI STRP: ORAL_STRIP | Status: DC

## 2011-10-09 MED ORDER — ONETOUCH ULTRA MINI W/DEVICE KIT: 1.0000 | PACK | Freq: Three times a day (TID) | Status: DC

## 2011-10-09 MED ORDER — TRUETRACK SMART SYSTEM KIT: 1.0000 | PACK | Freq: Three times a day (TID) | Status: DC

## 2011-10-09 MED ORDER — ONETOUCH DELICA LANCETS MISC: 1.0000 | Freq: Three times a day (TID) | Status: DC

## 2011-10-09 MED ORDER — DOXYCYCLINE HYCLATE 100 MG PO TABS: 100.0000 mg | ORAL_TABLET | Freq: Two times a day (BID) | ORAL | Status: AC

## 2011-10-09 MED ORDER — DOXYCYCLINE HYCLATE 100 MG PO TABS: 100.0000 mg | ORAL_TABLET | Freq: Two times a day (BID) | ORAL | Status: DC

## 2011-10-09 NOTE — Progress Notes (Signed)
Subjective:   [name] female   DOB: 24-Oct-1961 50 y.o.   MRN: [id number]  HPI: [name] is a 50 y.o.  Female with PMH significant as outlined below who presented to the clinic to get removal of a skin lesion on her back. It started off as a black head 6 month ago and it is increasing in size. She reports that she was informed she can get it removed done during this office visit since at the last office visit she had an other appointment. When informed that I will be not able to do so , she was really upset and want to leave. While examining her,  I notices that she had two ulcer like lesion with erythema on the her right leg and feet.  She mentioned that the lesion on the right leg has been present since 2 month after she hit it against a bed. It seems that it is getting worse now and more red. The other lesion on her toes has been present since 3 month are only healing very slow. She was recommended at the last office visit  to use Bactroban cream on the effected area which she did not . She denies any fevers or chills.   While reviewing her medication she got very upset and took away the medication since she wanted to get the skin lesion on  her back taking care off . She noted that this was the most important think for her today.    Past Medical History  Diagnosis Date  . Depression   . Diabetes mellitus   . GERD (gastroesophageal reflux disease)   . Hyperlipidemia   . Hypertension   . Chronic kidney disease    Current Outpatient Prescriptions  Medication Sig Dispense Refill  . albuterol (PROVENTIL HFA) 108 (90 BASE) MCG/ACT inhaler Inhale 2 puffs into the lungs every 6 (six) hours as needed for wheezing or shortness of breath.  3.7 g  4  . aspirin 81 MG tablet Take 81 mg by mouth daily.        Marland Kitchen atenolol (TENORMIN) 100 MG tablet Take 1 tablet (100 mg total) by mouth daily.  30 tablet  11  . Blood Glucose Monitoring Suppl (ONE TOUCH ULTRA MINI) W/DEVICE KIT 1  each by Does not apply route 3 (three) times daily.  1 each  0  . Blood Glucose Monitoring Suppl (TRUETRACK SMART SYSTEM) KIT 1 each by Does not apply route 3 (three) times daily.  1 each  0  . clopidogrel (PLAVIX) 75 MG tablet Take 1 tablet (75 mg total) by mouth daily.  30 tablet  11  . escitalopram (LEXAPRO) 10 MG tablet Take 1 tablet (10 mg total) by mouth daily.  30 tablet  6  . Fluticasone-Salmeterol (ADVAIR DISKUS) 250-50 MCG/DOSE AEPB Inhale 1 puff into the lungs 2 (two) times daily.  60 each  5  . glucose blood (ONE TOUCH ULTRA TEST) test strip Use as instructed  100 each  12  . hydrochlorothiazide 25 MG tablet TAKE 1 TABLET BY MOUTH EVERY DAY  30 tablet  5  . insulin lispro protamine-insulin lispro (HUMALOG 75/25) (75-25) 100 UNIT/ML SUSP Inject subcutaneously 120 units in the morning and 65 units at night  10 mL  3  . lisinopril (PRINIVIL,ZESTRIL) 20 MG tablet Take 1 tablet (20 mg total) by mouth daily.  30 tablet  6  . ONETOUCH DELICA LANCETS MISC 1 each by Does not apply route 3 (three)  times daily.      . pregabalin (LYRICA) 100 MG capsule Take 1 capsule (100 mg total) by mouth 3 (three) times daily.  90 capsule  7  . traMADol (ULTRAM) 50 MG tablet TAKE 1 TABLET BY MOUTH 2-3 TIMES A DAY AS NEEDED FOR PAIN  60 tablet  1  . traZODone (DESYREL) 100 MG tablet Take 100 mg by mouth at bedtime.        Review of Systems: Constitutional: Denies fever, chills, diaphoresis, appetite change and fatigue.  Respiratory: Denies SOB, DOE, cough, chest tightness,  and wheezing.   Cardiovascular: Denies chest pain, palpitations and leg swelling.  Gastrointestinal: Denies nausea, vomiting, abdominal pain, diarrhea, constipation, blood in stool and abdominal distention.  Neurological: Denies dizziness, seizures, syncope, weakness, light-headedness, numbness and headaches.    Objective:  Physical Exam: Filed Vitals:   10/09/11 1434  BP: 119/69  Pulse: 69  Temp: 97.3 F (36.3 C)  TempSrc: Oral    Height: 5\' 7"  (1.702 m)  Weight: 256 lb (116.121 kg)   Constitutional: Vital signs reviewed.  Patient is a well-developed and well-nourished  in no acute distress. Alert and oriented x3.  Cardiovascular: RRR, S1 normal, S2 normal, no MRG, pulses symmetric and intact bilaterally Pulmonary/Chest: CTAB, no wheezes, rales, or rhonchi Abdominal: Soft. Non-tender, non-distended, bowel sounds are normal, no masses, organomegaly, or guarding present.  Neurological: A&O x3,no focal motor deficit, sensory intact to light touch bilaterally.  Skin: right leg: 2 ulcer like lesion on the lateral area of lower leg, 2 inch x 3 inch in size, some dry yellow discharge noted, surrounded by erythema. Toe of left foot; one lesion noted: no erythema, no discharge, no erythema

## 2011-10-09 NOTE — Progress Notes (Signed)
Medical Nutrition Therapy:  Appt start time: 1100 AM end time:  1148 AM.  Assessment:  Primary concerns today: Blood sugar control Blood sugar 287- just ate cheese doodles and did not take last night or this am insulin doses. Reports depression is a big factor impeding her diabetes care. 98- 99% in taking her PM insulin - 100% in taking her am insulin Usual eating pattern includes Meal 3 and 1+ snacks per day.   Interested in working on increasing her exercise.  Avoided foods include: soda for most part.    Usual physical activity includes ADLs only with foot, leg and shoulder injuries   Progress Towards Goal(s):  In progress   Nutritional Diagnosis:  NB-1.1 Food and nutrition-related knowledge deficit NB-1.4 Self-monitoring deficit As related to not having a working meter.  As evidenced by patient reprot.    Intervention: 1- review of foods that raise blood sugar 2- Social support provided, coordination of care- discussed care with physician, lab, nurse 3- gave patient true track and one touch ultra mini meters today.      Monitoring/Evaluation:  Dietary intake and meter download  in 4- 6 week(s)

## 2011-10-10 NOTE — Assessment & Plan Note (Addendum)
Started on 14 day course of Doxycyline . Patient was seen in the past by Wound care ( 2 month ago) . Patient wanted to make her own appointment and refused assistance with it. Will have he come back in  3 weeks for reevaluation. I informed her that she needs to be seen sooner if the lesion increases in size , increased drainage and fevers or chills.

## 2011-10-14 ENCOUNTER — Telehealth: Payer: Self-pay | Admitting: Dietician

## 2011-10-14 NOTE — Telephone Encounter (Signed)
Called to follow up on patient since she was here last week for leg wounds, diabetes, spot on back and immunizations and left upset.    She reports that she understands some visits don't go as expected and looks forward to meeting her doctor. She has made an appointment at wound center for this week, still getting there[pay for shoulder and sees surgeon this week as well since she fell on shoulder. She made CDE appointment for 230 Thursday to get her immunizations and check on her diabetes.

## 2011-10-16 ENCOUNTER — Ambulatory Visit (INDEPENDENT_AMBULATORY_CARE_PROVIDER_SITE_OTHER): Payer: Medicare Other | Admitting: Dietician

## 2011-10-16 DIAGNOSIS — E1165 Type 2 diabetes mellitus with hyperglycemia: Secondary | ICD-10-CM

## 2011-10-16 DIAGNOSIS — IMO0002 Reserved for concepts with insufficient information to code with codable children: Secondary | ICD-10-CM

## 2011-10-16 DIAGNOSIS — E1169 Type 2 diabetes mellitus with other specified complication: Secondary | ICD-10-CM

## 2011-10-16 DIAGNOSIS — Z23 Encounter for immunization: Secondary | ICD-10-CM

## 2011-10-16 NOTE — Progress Notes (Signed)
Medical Nutrition Therapy:  Appt start time: 1430 PM end time:  1455 PM.  Assessment:  Primary concerns today: Blood sugar control Blood sugar 309 - just ate half peanut buter and jelly sandwich and koolaid. Did not take last night or this am insulin doses again today her exercise.  Avoided foods include: soda for most part.    Usual physical activity includes ADLs only with foot, leg and shoulder injuries   Progress Towards Goal(s):  In progress   Nutritional Diagnosis:   NB-1.1 Food and nutrition-related knowledge deficit improving as patient better able to verbalize more foods that raise blood sugar as evidenced by patient report and questions  NB-1.4 Self-monitoring deficit  As related to not checking blood sugar today  As evidenced by patient report.    Intervention: 1- review of foods that raise blood sugar 2- Social support provided, coordination of care- discussed care with nurse 3- patient Diabetes care card presented after she received the flu and pneumonia vaccines today       Monitoring/Evaluation:  Dietary intake and meter download  in 4- 6 week(s)

## 2011-10-17 ENCOUNTER — Encounter (HOSPITAL_BASED_OUTPATIENT_CLINIC_OR_DEPARTMENT_OTHER): Payer: Medicare Other | Attending: General Surgery

## 2011-10-17 DIAGNOSIS — L97809 Non-pressure chronic ulcer of other part of unspecified lower leg with unspecified severity: Secondary | ICD-10-CM | POA: Insufficient documentation

## 2011-10-17 DIAGNOSIS — E1169 Type 2 diabetes mellitus with other specified complication: Secondary | ICD-10-CM | POA: Insufficient documentation

## 2011-10-20 ENCOUNTER — Ambulatory Visit: Payer: Medicare Other

## 2011-10-24 ENCOUNTER — Encounter (HOSPITAL_BASED_OUTPATIENT_CLINIC_OR_DEPARTMENT_OTHER): Payer: Medicare Other | Attending: General Surgery

## 2011-10-24 DIAGNOSIS — L97809 Non-pressure chronic ulcer of other part of unspecified lower leg with unspecified severity: Secondary | ICD-10-CM | POA: Insufficient documentation

## 2011-10-24 DIAGNOSIS — E1169 Type 2 diabetes mellitus with other specified complication: Secondary | ICD-10-CM | POA: Insufficient documentation

## 2011-10-26 ENCOUNTER — Other Ambulatory Visit: Payer: Self-pay | Admitting: Internal Medicine

## 2011-10-31 ENCOUNTER — Other Ambulatory Visit: Payer: Self-pay | Admitting: *Deleted

## 2011-10-31 DIAGNOSIS — IMO0002 Reserved for concepts with insufficient information to code with codable children: Secondary | ICD-10-CM

## 2011-10-31 DIAGNOSIS — E1165 Type 2 diabetes mellitus with hyperglycemia: Secondary | ICD-10-CM

## 2011-10-31 DIAGNOSIS — E1169 Type 2 diabetes mellitus with other specified complication: Secondary | ICD-10-CM

## 2011-10-31 MED ORDER — GLUCOSE BLOOD VI STRP: ORAL_STRIP | Status: DC

## 2011-10-31 MED ORDER — INSULIN LISPRO PROT & LISPRO (75-25 MIX) 100 UNIT/ML ~~LOC~~ SUSP: SUBCUTANEOUS | Status: DC

## 2011-10-31 NOTE — Telephone Encounter (Signed)
Please make the insulin PENS Thank you

## 2011-10-31 NOTE — Telephone Encounter (Signed)
Pt needs an appt with PCP - first available. Thanks

## 2011-11-06 NOTE — Telephone Encounter (Signed)
Per dr Midwife pt needs appt asap  Thanks,h

## 2011-11-17 ENCOUNTER — Ambulatory Visit (INDEPENDENT_AMBULATORY_CARE_PROVIDER_SITE_OTHER): Payer: Medicare Other | Admitting: Internal Medicine

## 2011-11-17 ENCOUNTER — Encounter: Payer: Self-pay | Admitting: Internal Medicine

## 2011-11-17 VITALS — BP 175/79 | HR 77 | Temp 98.6°F | Wt 256.6 lb

## 2011-11-17 DIAGNOSIS — F329 Major depressive disorder, single episode, unspecified: Secondary | ICD-10-CM

## 2011-11-17 DIAGNOSIS — M25519 Pain in unspecified shoulder: Secondary | ICD-10-CM

## 2011-11-17 DIAGNOSIS — E1165 Type 2 diabetes mellitus with hyperglycemia: Secondary | ICD-10-CM

## 2011-11-17 DIAGNOSIS — I10 Essential (primary) hypertension: Secondary | ICD-10-CM

## 2011-11-17 DIAGNOSIS — M5137 Other intervertebral disc degeneration, lumbosacral region: Secondary | ICD-10-CM

## 2011-11-17 DIAGNOSIS — IMO0002 Reserved for concepts with insufficient information to code with codable children: Secondary | ICD-10-CM

## 2011-11-17 DIAGNOSIS — G4700 Insomnia, unspecified: Secondary | ICD-10-CM

## 2011-11-17 DIAGNOSIS — I739 Peripheral vascular disease, unspecified: Secondary | ICD-10-CM

## 2011-11-17 DIAGNOSIS — E1169 Type 2 diabetes mellitus with other specified complication: Secondary | ICD-10-CM

## 2011-11-17 DIAGNOSIS — F3289 Other specified depressive episodes: Secondary | ICD-10-CM

## 2011-11-17 LAB — GLUCOSE, CAPILLARY: Glucose-Capillary: 292 mg/dL — ABNORMAL HIGH (ref 70–99)

## 2011-11-17 MED ORDER — BACLOFEN 10 MG PO TABS: 10.0000 mg | ORAL_TABLET | Freq: Three times a day (TID) | ORAL | Status: DC

## 2011-11-17 MED ORDER — TRAMADOL HCL 50 MG PO TABS
50.0000 mg | ORAL_TABLET | Freq: Four times a day (QID) | ORAL | Status: DC | PRN
Start: 2011-11-17 — End: 2011-12-03

## 2011-11-17 MED ORDER — ESCITALOPRAM OXALATE 20 MG PO TABS: 20.0000 mg | ORAL_TABLET | Freq: Every day | ORAL | Status: DC

## 2011-11-17 NOTE — Assessment & Plan Note (Signed)
Feels her escitalopram is not working. Will increase the dose and review depression in 2 weeks. Plan to add a second agent, weight loss, increase physical activity which may be contributing.

## 2011-11-17 NOTE — Progress Notes (Signed)
RX faxed to pharmacy.Ezra Sites (aama)11/17/11 1630

## 2011-11-17 NOTE — Assessment & Plan Note (Signed)
Advised to follow up with Dr. Darrick Penna.

## 2011-11-17 NOTE — Assessment & Plan Note (Signed)
No changes inmmeds today. Given instructions on weight loss and diabetic 1500 calorie diet instructions. She will see Gavin Pound and has been working with her to improve her diabetes.

## 2011-11-17 NOTE — Assessment & Plan Note (Addendum)
Had rotator cuff surgery 2 months ago.

## 2011-11-17 NOTE — Progress Notes (Signed)
  Subjective:    [name], female    DOB: 09-09-1961, 50 y.o.   MRN: [id number]  HPI Patient is a 50 year old F wityh PMH as noted in the chart. She used to see Dr Aldine Contes in the past but has not been able to get to her new PCP.  Complains of back pain 12 out of 10, chronic with acute worsening over last few days, has to rest after walking every block, radiating to right buttocks, has good mobility of her back only restricted by pain. No changes in urinary or bowel movements.  Her diabetes is worsening as her last Hba1c was 9.9 and her BG readings are above 200. She injects 120 units in am and 65 at night. Unable to exercise and unable to afford healthy food.  Smokes 1/2 pack a day which is down from 2 packs per day. Not ready to quit as she is "very stressed all the time".  Up to date on vaccinations.   Review of Systems  Constitutional: Negative for fever, activity change and appetite change.  HENT: Negative for sore throat.   Respiratory: Negative for cough and shortness of breath.   Cardiovascular: Negative for chest pain and leg swelling.  Gastrointestinal: Negative for nausea, abdominal pain, diarrhea, constipation and abdominal distention.  Genitourinary: Negative for frequency, hematuria and difficulty urinating.  Musculoskeletal: Positive for back pain, arthralgias and gait problem.  Neurological: Negative for dizziness and headaches.  Psychiatric/Behavioral: Negative for suicidal ideas and behavioral problems. The patient is nervous/anxious.        Objective:   Physical Exam  Constitutional: She is oriented to person, place, and time. She appears well-developed and well-nourished.  HENT:  Head: Normocephalic and atraumatic.  Eyes: Conjunctivae and EOM are normal. Pupils are equal, round, and reactive to light. No scleral icterus.  Neck: Normal range of motion. Neck supple. No JVD present. No thyromegaly present.  Cardiovascular: Normal rate, regular  rhythm, normal heart sounds and intact distal pulses.  Exam reveals no gallop and no friction rub.   No murmur heard. Pulmonary/Chest: Effort normal and breath sounds normal. No respiratory distress. She has no wheezes. She has no rales.  Abdominal: Soft. Bowel sounds are normal. She exhibits no distension and no mass. There is no tenderness. There is no rebound and no guarding.  Musculoskeletal: Normal range of motion. She exhibits tenderness (Lower back is tender to palpation with some paraspinal stiffness.). She exhibits no edema.       Patient was able to go raise her both legs while lying down (negative SLRT)  Lymphadenopathy:    She has no cervical adenopathy.  Neurological: She is alert and oriented to person, place, and time.  Psychiatric: She has a normal mood and affect. Her behavior is normal.          Assessment & Plan:

## 2011-11-17 NOTE — Assessment & Plan Note (Signed)
Chronic pain with acute exacerbation. No red flag signs at this time. No need to repeat MRI. PT referral to work on balance. Baclofen and tramadol to control pain. Smoking cessation recommended along with weight loss.

## 2011-11-17 NOTE — Assessment & Plan Note (Signed)
I will not make nay changes today as the BP may be high 2/2 back pain that she has come in with. Review in 2 weeks. Advised to stop smoking and work on her weight.

## 2011-11-17 NOTE — Progress Notes (Signed)
Rx faxed into pharmacy. KayeGoldston,CMA (AAMA) 11/17/11 1630

## 2011-11-17 NOTE — Patient Instructions (Signed)
1500 Calorie Diabetic Diet The 1500 calorie diabetic diet limits calories to 1500 each day. Following this diet and making healthy meal choices can help improve overall health. It controls blood glucose (sugar) levels and can also help lower blood pressure and cholesterol.  SERVING SIZES Measuring foods and serving sizes helps to make sure you are getting the right amount of food. The list below tells how big or small some common serving sizes are.  1 oz.........4 stacked dice.   3 oz.........Deck of cards.   1 tsp........Tip of little finger.   1 tbs........Thumb.   2 tbs........Golf ball.    cup.......Half of a fist.   1 cup........A fist.  GUIDELINES FOR CHOOSING FOODS The goal of this diet is to eat a variety of foods and limit calories to 1500 each day. This can be done by choosing foods that are low in calories and fat. The diet also suggests eating small amounts of food frequently. Doing this helps control your blood glucose levels, so they do not get too high or too low. Each meal or snack may include a protein food source to help you feel more satisfied. Try to eat about the same amount of food around the same time each day. This includes weekend days, travel days, and days off work. Space your meals about 4 to 5 hours apart, and add a snack between them, if you wish.  For example, a daily food plan could include breakfast, a morning snack, lunch, dinner, and an evening snack. Healthy meals and snacks have different types of foods, including whole grains, vegetables, fruits, lean meats, poultry, fish, and dairy products. As you plan your meals, select a variety of foods. Choose from the bread and starch, vegetable, fruit, dairy, and meat/protein groups. Examples of foods from each group are listed below, with their suggested serving sizes. Use measuring cups and spoons to become familiar with what a healthy portion looks like. Bread and Starch Each serving equals 15 grams of  carbohydrate.  1 slice bread.    bagel.    cup cold cereal (unsweetened).    cup hot cereal or mashed potatoes.   1 small potato (size of a computer mouse).   ? cup cooked pasta or rice.    English muffin.   1 cup broth-based soup.   3 cups of popcorn.   4 to 6 whole-wheat crackers.    cup cooked beans, peas, or corn.  Vegetables Each serving equals 5 grams of carbohydrate.   cup cooked vegetables.   1 cup raw vegetables.    cup tomato or vegetable juice.  Fruit Each serving equals 15 grams of carbohydrate.  1 small apple or orange.   1  cup watermelon or strawberries.    cup applesauce (no sugar added).   2 tbs raisins.    banana.    cup canned fruit, packed in water or in its own juice.    cup unsweetened fruit juice.  Dairy Each serving equals 12 to 15 grams of carbohydrate.  1 cup fat-free milk.   6 oz artificially sweetened yogurt or plain yogurt.   1 cup low-fat buttermilk.   1 cup soy milk.   1 cup almond milk.  Meat/Protein  1 large egg.   2 to 3 oz meat, poultry, or fish.    cup low-fat cottage cheese.   1 tbs peanut butter.   1 oz low-fat cheese.    cup tuna, packed in water.    cup tofu.  Fat    1 tsp oil.   1 tsp trans-fat-free margarine.   1 tsp butter.   1 tsp mayonnaise.   2 tbs avocado.   1 tbs salad dressing.   1 tbs cream cheese.   2 tbs sour cream.  SAMPLE 1500 CALORIE DIET PLAN Breakfast   whole-wheat English muffin (1 carb serving).   1 tsp trans-fat-free margarine.   1 scrambled egg.   1 cup fat-free milk (1 carb serving).   1 small orange (1 carb serving).  Lunch  Chicken wrap.   1 whole-wheat tortilla, 8-inch (1 carb servings).   2 oz chicken breast, sliced.   2 tbs low-fat salad dressing, such as Italian.    cup shredded lettuce.   2 slices tomato.    cup carrot sticks.   1 small apple (1 carb serving).  Afternoon Snack  3 graham cracker squares (1 carb  serving).   1 tbs peanut butter.  Dinner  2 oz lean pork chop, broiled.   1 cup brown rice (3 carb servings).    cup steamed carrots.    cup green beans.   1 cup fat-free milk (1 carb serving).   1 tsp trans-fat-free margarine.  Evening Snack   cup low-fat cottage cheese.   1 small peach or pear, sliced (or  cup canned in water) (1 carb serving).  MEAL PLAN You can use this worksheet to help you make a daily meal plan based on the 1500 calorie diabetic diet suggestions. If you are using this plan to help you control your blood glucose, you may interchange carbohydrate containing foods (dairy, starches, and fruits). Select a variety of fresh foods of varying colors and flavors. The total amount of carbohydrate in your meals or snacks is more important than making sure you include all of the food groups every time you eat. You can choose from approximately this many of the following foods to build your day's meals:  6 Starches.   3 Vegetables.   2 Fruits.   2 Dairy.   4 to 6 oz Meat/Protein.   Up to 3 Fats.  Your dietician can use this worksheet to help you decide how many servings and which types of foods are right for you. BREAKFAST Food Group and Servings / Food Choice Starch _________________________________________________________ Dairy __________________________________________________________ Fruit ___________________________________________________________ Meat/Protein____________________________________________________ Fat ____________________________________________________________ LUNCH Food Group and Servings / Food Choice  Starch _________________________________________________________ Meat/Protein ___________________________________________________ Vegetables _____________________________________________________ Fruit __________________________________________________________ Dairy __________________________________________________________ Fat  ____________________________________________________________ AFTERNOON SNACK Food Group and Servings / Food Choice Dairy __________________________________________________________ Starch _________________________________________________________ Meat/Protein____________________________________________________ Fruit ___________________________________________________________ DINNER Food Group and Servings / Food Choice Starch _________________________________________________________ Meat/Protein ___________________________________________________ Dairy __________________________________________________________ Vegetable ______________________________________________________ Fruit ___________________________________________________________ Fat ____________________________________________________________ EVENING SNACK Food Group and Servings / Food Choice Fruit ___________________________________________________________ Meat/Protein ____________________________________________________ Dairy __________________________________________________________ Starch __________________________________________________________ DAILY TOTALS Starches _________________________ Vegetables _______________________ Fruits ____________________________ Dairy ____________________________ Meat/Protein_____________________ Fats _____________________________ Document Released: 06/30/2005 Document Revised: 08/20/2011 Document Reviewed: 10/25/2009 [employer] Patient Information 2012 [employer]. 

## 2011-11-24 ENCOUNTER — Other Ambulatory Visit: Payer: Self-pay | Admitting: *Deleted

## 2011-11-24 DIAGNOSIS — G4700 Insomnia, unspecified: Secondary | ICD-10-CM

## 2011-11-24 NOTE — Telephone Encounter (Signed)
Refill states take 1/2 tab at bedtime.

## 2011-11-25 ENCOUNTER — Other Ambulatory Visit: Payer: Self-pay | Admitting: *Deleted

## 2011-11-27 MED ORDER — TRAZODONE HCL 100 MG PO TABS: 100.0000 mg | ORAL_TABLET | Freq: Every day | ORAL | Status: DC

## 2011-11-28 ENCOUNTER — Encounter (HOSPITAL_BASED_OUTPATIENT_CLINIC_OR_DEPARTMENT_OTHER): Payer: Medicare Other | Attending: General Surgery

## 2011-11-28 DIAGNOSIS — L97209 Non-pressure chronic ulcer of unspecified calf with unspecified severity: Secondary | ICD-10-CM | POA: Insufficient documentation

## 2011-12-02 ENCOUNTER — Ambulatory Visit: Payer: Medicare Other | Attending: Internal Medicine | Admitting: Physical Therapy

## 2011-12-02 DIAGNOSIS — M545 Low back pain, unspecified: Secondary | ICD-10-CM | POA: Insufficient documentation

## 2011-12-02 DIAGNOSIS — IMO0001 Reserved for inherently not codable concepts without codable children: Secondary | ICD-10-CM | POA: Insufficient documentation

## 2011-12-02 DIAGNOSIS — M5137 Other intervertebral disc degeneration, lumbosacral region: Secondary | ICD-10-CM | POA: Insufficient documentation

## 2011-12-02 DIAGNOSIS — R269 Unspecified abnormalities of gait and mobility: Secondary | ICD-10-CM | POA: Insufficient documentation

## 2011-12-02 DIAGNOSIS — M51379 Other intervertebral disc degeneration, lumbosacral region without mention of lumbar back pain or lower extremity pain: Secondary | ICD-10-CM | POA: Insufficient documentation

## 2011-12-03 ENCOUNTER — Ambulatory Visit (INDEPENDENT_AMBULATORY_CARE_PROVIDER_SITE_OTHER): Payer: Medicare Other | Admitting: Dietician

## 2011-12-03 ENCOUNTER — Ambulatory Visit (INDEPENDENT_AMBULATORY_CARE_PROVIDER_SITE_OTHER): Payer: Medicare Other | Admitting: Internal Medicine

## 2011-12-03 ENCOUNTER — Other Ambulatory Visit: Payer: Self-pay | Admitting: Internal Medicine

## 2011-12-03 ENCOUNTER — Encounter: Payer: Self-pay | Admitting: Internal Medicine

## 2011-12-03 DIAGNOSIS — E1169 Type 2 diabetes mellitus with other specified complication: Secondary | ICD-10-CM

## 2011-12-03 DIAGNOSIS — E119 Type 2 diabetes mellitus without complications: Secondary | ICD-10-CM

## 2011-12-03 DIAGNOSIS — F1120 Opioid dependence, uncomplicated: Secondary | ICD-10-CM | POA: Insufficient documentation

## 2011-12-03 DIAGNOSIS — G4700 Insomnia, unspecified: Secondary | ICD-10-CM

## 2011-12-03 DIAGNOSIS — G894 Chronic pain syndrome: Secondary | ICD-10-CM

## 2011-12-03 DIAGNOSIS — IMO0002 Reserved for concepts with insufficient information to code with codable children: Secondary | ICD-10-CM

## 2011-12-03 DIAGNOSIS — E1165 Type 2 diabetes mellitus with hyperglycemia: Secondary | ICD-10-CM

## 2011-12-03 DIAGNOSIS — Z79891 Long term (current) use of opiate analgesic: Secondary | ICD-10-CM | POA: Insufficient documentation

## 2011-12-03 DIAGNOSIS — J069 Acute upper respiratory infection, unspecified: Secondary | ICD-10-CM | POA: Insufficient documentation

## 2011-12-03 HISTORY — DX: Chronic pain syndrome: G89.4

## 2011-12-03 LAB — GLUCOSE, CAPILLARY: Glucose-Capillary: 179 mg/dL — ABNORMAL HIGH (ref 70–99)

## 2011-12-03 LAB — POCT GLYCOSYLATED HEMOGLOBIN (HGB A1C): Hemoglobin A1C: 9

## 2011-12-03 MED ORDER — FLUTICASONE PROPIONATE 50 MCG/ACT NA SUSP: 1.0000 | Freq: Two times a day (BID) | NASAL | Status: DC

## 2011-12-03 MED ORDER — TRAMADOL HCL 50 MG PO TABS: 100.0000 mg | ORAL_TABLET | Freq: Four times a day (QID) | ORAL | Status: DC | PRN

## 2011-12-03 MED ORDER — TRAZODONE HCL 100 MG PO TABS: 200.0000 mg | ORAL_TABLET | Freq: Every day | ORAL | Status: DC

## 2011-12-03 NOTE — Patient Instructions (Addendum)
1.  You have a viral illness that will slowly get better over the next week.  Continue to work on drinking enough water and getting as much sleep as you can.  2.  You can treat the congestion with Coricidin HBP (over the counter), you can also use the Flonase 1 spray in each nostril twice daily to help with the congestion.  3.  Vicks Vaporub can help with the cough and you can use saline gargle for any throat pain.    4.  Increase the Trazodone to 200 mg at bedtime to help you sleep  5.  Continue your other medications as prescribed.  6.  Make sure you are checking your blood sugar at least twice daily.  Remember the "Befores"  Before breakfast, before lunch, before supper, and before bed.  Pick two daily and alternate each day.  7.  Follow up in about 1 month to see how you are doing.

## 2011-12-03 NOTE — Progress Notes (Signed)
Medical Nutrition Therapy:  Appt start time: 1430 PM end time:  1455 PM.  Assessment:  Primary concerns today: Blood sugar control Blood sugar 170- about to drink 20 ounce regular soda and asks if she should take insulin. reports that she takes Pm insulin at bedtime. Patient reports lack of food and financial and familial difficulty/stress impedes self care. A1C improving.  Tries to limit : soda and potato chips .    Usual physical activity includes ADLs only with foot, leg and shoulder injuries   Progress Towards Goal(s):  In progress   Nutritional Diagnosis:   NB-1.1 Food and nutrition-related knowledge deficit improving as patient better able to verbalize more foods that raise blood sugar as evidenced by patient report and questions however needs much reinforcement  NB-1.4 Self-monitoring deficit  As related to not checking blood sugar at least twice a day  As evidenced by patient report.    Intervention: 1- review of foods that raise blood sugar 2- Social support provided, coordination of care- discussed care with nurse 3- Food provided form food pantry on site. Has had referral to social work in past    Monitoring/Evaluation:  Dietary intake and meter download  In 3  Week(s) per patient request

## 2011-12-03 NOTE — Progress Notes (Signed)
Subjective:   [name] female   DOB: [birth year] 50 y.o.   MRN: [id number]  HPI: [name] is a 50 y.o. woman who presents to clinic today with several complaints.    She states that she has been congested, ringing in her ears and pain on the right side of her face for the last week or so.  She has also noted pain in her chest when she coughs.  The cough is productive of yellow sputum.  She denies increase in SOB, fevers, chills, nausea, diarrhea, vomiting, or body aches.  She got her flu shot this year.   She states that she has pain "all over the place."  She has been taking 2 tramadol 4 times daily and still has pain everywhere.  The pain migrates from her back to her shoulders to her legs and back again in no discernable pattern.  She had surgery on her left shoulder several months ago and her surgeon recently refused to fill her percocet.  She states that she has not been taking the baclofen which she was prescribed.  She denies fevers, swelling in the joints, redness in the joints, or rashes.  She has a problem falling asleep and when she does is able to only sleep for an hour or two at a time.  She was prescribed trazodone 50 mg qhs but states that she's tried taking more and has to take up to 200 mg to be able to sleep.  She states that when she takes 200 mg at bedtime she is able to get a full 6-8 hours.   She continues to work with wound care.  She recently had the right leg soft cast off and still has the cast on the left.  Past Medical History  Diagnosis Date  . Depression   . Diabetes mellitus   . GERD (gastroesophageal reflux disease)   . Hyperlipidemia   . Hypertension   . Chronic kidney disease    Current Outpatient Prescriptions  Medication Sig Dispense Refill  . albuterol (PROVENTIL HFA) 108 (90 BASE) MCG/ACT inhaler Inhale 2 puffs into the lungs every 6 (six) hours as needed for wheezing or shortness of breath.  3.7 g  4  . aspirin 81 MG tablet  Take 81 mg by mouth daily.        Marland Kitchen atenolol (TENORMIN) 100 MG tablet Take 1 tablet (100 mg total) by mouth daily.  30 tablet  11  . baclofen (LIORESAL) 10 MG tablet Take 1 tablet (10 mg total) by mouth 3 (three) times daily.  30 each  1  . Blood Glucose Monitoring Suppl (ONE TOUCH ULTRA MINI) W/DEVICE KIT 1 each by Does not apply route 3 (three) times daily.  1 each  0  . Blood Glucose Monitoring Suppl (TRUETRACK SMART SYSTEM) KIT 1 each by Does not apply route 3 (three) times daily.  1 each  0  . clopidogrel (PLAVIX) 75 MG tablet Take 1 tablet (75 mg total) by mouth daily.  30 tablet  11  . escitalopram (LEXAPRO) 20 MG tablet Take 1 tablet (20 mg total) by mouth daily.  30 tablet  6  . Fluticasone-Salmeterol (ADVAIR DISKUS) 250-50 MCG/DOSE AEPB Inhale 1 puff into the lungs 2 (two) times daily.  60 each  5  . glucose blood (ONE TOUCH ULTRA TEST) test strip Use as instructed  100 each  12  . glucose blood (TRUETRACK TEST) test strip Use as instructed  100 each  12  .  hydrochlorothiazide 25 MG tablet TAKE 1 TABLET BY MOUTH EVERY DAY  30 tablet  5  . insulin lispro protamine-insulin lispro (HUMALOG 75/25) (75-25) 100 UNIT/ML SUSP Inject subcutaneously 120 units in the morning and 65 units at night  60 mL  3  . lisinopril (PRINIVIL,ZESTRIL) 20 MG tablet Take 1 tablet (20 mg total) by mouth daily.  30 tablet  6  . ONETOUCH DELICA LANCETS MISC 1 each by Does not apply route 3 (three) times daily.      Marland Kitchen oxyCODONE-acetaminophen (PERCOCET) 10-325 MG per tablet Take 1 tablet by mouth every 6 (six) hours as needed. Take 1 or 2 tablet every 6 hours prn. Prescribed by Merwyn Katos       . oxyCODONE-acetaminophen (PERCOCET) 5-325 MG per tablet Take 1 tablet by mouth every 6 (six) hours as needed.        . pregabalin (LYRICA) 100 MG capsule Take 1 capsule (100 mg total) by mouth 3 (three) times daily.  90 capsule  7  . traMADol (ULTRAM) 50 MG tablet Take 1 tablet (50 mg total) by mouth every 6 (six) hours as  needed for pain. Maximum dose= 8 tablets per day  60 tablet  1  . traZODone (DESYREL) 100 MG tablet Take 1 tablet (100 mg total) by mouth at bedtime.  31 tablet  3   No family history on file. History   Social History  . Marital Status: Single    Spouse Name: N/A    Number of Children: N/A  . Years of Education: N/A   Social History Main Topics  . Smoking status: Current Everyday Smoker -- 0.5 packs/day for 40 years    Types: Cigarettes  . Smokeless tobacco: Never Used  . Alcohol Use: No  . Drug Use: No  . Sexually Active: None   Other Topics Concern  . None   Social History Narrative  . None   Review of Systems: Constitutional: Denies fever, chills, diaphoresis, appetite change and fatigue.  HEENT: Positive for sore throat,  congestion.  Denies photophobia, eye pain, redness, hearing loss, ear pain, rhinorrhea, sneezing, mouth sores, trouble swallowing, neck pain, neck stiffness and tinnitus.   Respiratory: Positive for cough.  Denies SOB, DOE, chest tightness,  and wheezing.   Cardiovascular: Denies chest pain, palpitations and leg swelling.  Gastrointestinal: Denies nausea, vomiting, abdominal pain, diarrhea, constipation, blood in stool and abdominal distention.  Genitourinary: Denies dysuria, urgency, frequency, hematuria, flank pain and difficulty urinating.  Musculoskeletal: Denies myalgias, back pain, joint swelling, arthralgias and gait problem.  Skin: Denies pallor, rash and wound.  Neurological: Denies dizziness, seizures, syncope, weakness, light-headedness, numbness and headaches.  Hematological: Denies adenopathy. Easy bruising, personal or family bleeding history  Psychiatric/Behavioral: Denies suicidal ideation, mood changes, confusion, nervousness, sleep disturbance and agitation  Objective:  Physical Exam: Filed Vitals:   12/03/11 1448  BP: 165/81  Pulse: 80  Temp: 97.2 F (36.2 C)  TempSrc: Oral  Height: 5\' 7"  (1.702 m)  Weight: 264 lb 4.8 oz  (119.886 kg)   Constitutional: Vital signs reviewed.  Patient is a well-developed and well-nourished woman in no acute distress and cooperative with exam. Alert and oriented x3.  Head: Normocephalic and atraumatic Ear: TM normal bilaterally Mouth: no posterior pharynx erythema or exudates, MMM Nose: Turbinate erythema and drainage noted.  Eyes: PERRL, EOMI, conjunctivae normal, No scleral icterus.  Neck: Supple, Trachea midline normal ROM, No JVD, mass, thyromegaly, or carotid bruit present.  Cardiovascular: RRR, S1 normal, S2 normal, no MRG,  pulses symmetric and intact bilaterally Pulmonary/Chest: mild rhonchi noted that clear with coughing.  Otherwise CTAB, no wheezes, rales Abdominal: Soft. Non-tender, non-distended, bowel sounds are normal, no masses, organomegaly, or guarding present.  GU: no CVA tenderness Musculoskeletal: No joint deformities, erythema, or stiffness, ROM full and no nontender Hematology: no cervical, inginal, or axillary adenopathy.  Neurological: A&O x3, Strenghth is normal and symmetric bilaterally, cranial nerve II-XII are grossly intact, no focal motor deficit, sensory intact to light touch bilaterally.  Skin: Warm, dry and intact. No rash, cyanosis, or clubbing.  Psychiatric: Normal mood and anxious affect. speech and behavior is normal. Judgment and thought content normal. Cognition and memory are normal.   Assessment & Plan:

## 2011-12-03 NOTE — Patient Instructions (Signed)
Please be sure to take insulin BEFORE breakfast and BEFORE dinner. (You are on what is called a mealtime insulin- take with meals)  Try to eat about the same amount of carb for each meal  20 ounce soda = 5 carb servings  2 pancakes and 2 tbsp syrup = 4 carbs  1 cup rice = 3 carb servings  1 potato  The size of a fist = about 2 carb servings  Follow up in 4-6 weeks

## 2011-12-09 ENCOUNTER — Ambulatory Visit: Payer: Medicare Other | Admitting: Physical Therapy

## 2011-12-11 ENCOUNTER — Encounter: Payer: Medicare Other | Admitting: Physical Therapy

## 2011-12-15 NOTE — Assessment & Plan Note (Signed)
Lab Results  Component Value Date   HGBA1C 9.0 12/03/2011   HGBA1C 9.9 08/27/2011   HGBA1C 9.0 05/02/2011   Lab Results  Component Value Date   MICROALBUR 0.50 09/14/2009   LDLCALC 90 10/09/2011   CREATININE 0.67 09/04/2011   Her A1C is better today then her last reading but still higher then her goal.  She has been watching her blood sugars better and has been taking her medication as prescribed.  She did no bring her meter with her so we will not make any adjustments today.  We discussed following her blood sugar closely and bringing her meter to her follow up so we can adjust her medication safely.

## 2011-12-15 NOTE — Assessment & Plan Note (Signed)
She has a chronic pain syndrome with underlying anxiety and depression.  We will treat her with Trazodone to help her sleep and see how she does.  Without any focal actual findings that would indicate any underlying pathology.

## 2011-12-15 NOTE — Assessment & Plan Note (Signed)
Her chronic pain is likely secondary to her anxiety and insomnia.  We will have her increase her trazodone to the 200 mg that she states work and follow up in a few weeks to see how she is feeling.

## 2011-12-15 NOTE — Assessment & Plan Note (Signed)
She has symptoms consistent with a Viral URI with cough.  With no underlying lung pathology we will treat symptomatically with saline gargles, Clorociden HBP for the congestion, and vicks and OTC cough suppressants.

## 2011-12-24 ENCOUNTER — Encounter: Payer: Medicare Other | Admitting: Internal Medicine

## 2011-12-24 ENCOUNTER — Ambulatory Visit: Payer: Medicare Other | Admitting: Dietician

## 2011-12-25 ENCOUNTER — Ambulatory Visit: Payer: Medicare Other | Attending: Internal Medicine | Admitting: Physical Therapy

## 2011-12-25 DIAGNOSIS — M545 Low back pain, unspecified: Secondary | ICD-10-CM | POA: Insufficient documentation

## 2011-12-25 DIAGNOSIS — M51379 Other intervertebral disc degeneration, lumbosacral region without mention of lumbar back pain or lower extremity pain: Secondary | ICD-10-CM | POA: Insufficient documentation

## 2011-12-25 DIAGNOSIS — R269 Unspecified abnormalities of gait and mobility: Secondary | ICD-10-CM | POA: Insufficient documentation

## 2011-12-25 DIAGNOSIS — M5137 Other intervertebral disc degeneration, lumbosacral region: Secondary | ICD-10-CM | POA: Insufficient documentation

## 2011-12-25 DIAGNOSIS — IMO0001 Reserved for inherently not codable concepts without codable children: Secondary | ICD-10-CM | POA: Insufficient documentation

## 2011-12-26 ENCOUNTER — Encounter (HOSPITAL_BASED_OUTPATIENT_CLINIC_OR_DEPARTMENT_OTHER): Payer: Medicare Other

## 2011-12-26 ENCOUNTER — Encounter (HOSPITAL_BASED_OUTPATIENT_CLINIC_OR_DEPARTMENT_OTHER): Payer: Medicare Other | Attending: General Surgery

## 2011-12-26 DIAGNOSIS — L97809 Non-pressure chronic ulcer of other part of unspecified lower leg with unspecified severity: Secondary | ICD-10-CM | POA: Insufficient documentation

## 2011-12-26 DIAGNOSIS — I872 Venous insufficiency (chronic) (peripheral): Secondary | ICD-10-CM | POA: Insufficient documentation

## 2011-12-29 ENCOUNTER — Ambulatory Visit: Payer: Medicare Other | Admitting: Internal Medicine

## 2011-12-29 ENCOUNTER — Other Ambulatory Visit: Payer: Self-pay | Admitting: *Deleted

## 2011-12-29 DIAGNOSIS — J449 Chronic obstructive pulmonary disease, unspecified: Secondary | ICD-10-CM

## 2011-12-29 MED ORDER — ALBUTEROL SULFATE HFA 108 (90 BASE) MCG/ACT IN AERS: 2.0000 | INHALATION_SPRAY | Freq: Four times a day (QID) | RESPIRATORY_TRACT | Status: DC | PRN

## 2011-12-30 ENCOUNTER — Encounter: Payer: Medicare Other | Admitting: Physical Therapy

## 2011-12-31 ENCOUNTER — Ambulatory Visit (INDEPENDENT_AMBULATORY_CARE_PROVIDER_SITE_OTHER): Payer: Medicare Other | Admitting: Internal Medicine

## 2011-12-31 ENCOUNTER — Encounter: Payer: Self-pay | Admitting: Internal Medicine

## 2011-12-31 DIAGNOSIS — E1165 Type 2 diabetes mellitus with hyperglycemia: Secondary | ICD-10-CM

## 2011-12-31 DIAGNOSIS — F329 Major depressive disorder, single episode, unspecified: Secondary | ICD-10-CM

## 2011-12-31 DIAGNOSIS — F3289 Other specified depressive episodes: Secondary | ICD-10-CM

## 2011-12-31 DIAGNOSIS — G894 Chronic pain syndrome: Secondary | ICD-10-CM

## 2011-12-31 DIAGNOSIS — J069 Acute upper respiratory infection, unspecified: Secondary | ICD-10-CM

## 2011-12-31 DIAGNOSIS — E1169 Type 2 diabetes mellitus with other specified complication: Secondary | ICD-10-CM

## 2011-12-31 DIAGNOSIS — IMO0002 Reserved for concepts with insufficient information to code with codable children: Secondary | ICD-10-CM

## 2011-12-31 DIAGNOSIS — L97909 Non-pressure chronic ulcer of unspecified part of unspecified lower leg with unspecified severity: Secondary | ICD-10-CM

## 2011-12-31 DIAGNOSIS — G4700 Insomnia, unspecified: Secondary | ICD-10-CM

## 2011-12-31 DIAGNOSIS — I10 Essential (primary) hypertension: Secondary | ICD-10-CM

## 2011-12-31 MED ORDER — AZITHROMYCIN 250 MG PO TABS
ORAL_TABLET | ORAL | Status: AC
Start: 2011-12-31 — End: 2012-01-05

## 2011-12-31 NOTE — Assessment & Plan Note (Signed)
Patient has been seen at [facility] long wound care clinic, and was recently "discharged" from them. She knows she can reach out directly to them if she has new ulcers that open up.

## 2011-12-31 NOTE — Assessment & Plan Note (Signed)
Patient's blood pressure was mildly elevated to 150 systolic today, but she did not take her blood pressure medicines this morning because she was late to her clinic appointment and in a rush. We will not make any changes today.

## 2011-12-31 NOTE — Progress Notes (Signed)
Subjective:     Patient ID: [name], female   DOB: 1961/11/07, 51 y.o.   MRN: [id number]  HPI Patient is a 51 year old woman well-known to our clinic with an extensive past medical history including but not limited to: Past Medical History  Diagnosis Date  . Depression   . Diabetes mellitus   . GERD (gastroesophageal reflux disease)   . Hyperlipidemia   . Hypertension   . Chronic kidney disease    she presents for followup. Please see individual is problem list for history, assessment, and plan.  Review of Systems Pain, cough, rhinorrhea, nasal congestion    Objective:   Physical Exam Filed Vitals:   12/31/11 0959  BP: 152/90  Pulse: 80  Temp: 96.6 F (35.9 C)  TempSrc: Oral  Height: 5\' 7"  (1.702 m)  Weight: 256 lb 8 oz (116.348 kg)  SpO2: 97%   GEN: No apparent distress.  Alert and oriented x 3.  Pleasant, conversant, and cooperative to exam. HEENT: NCAT.  Neck is supple without palpable masses or lymphadenopathy.  EOMI.  PERRLA.  Sclerae anicteric.  Conjunctivae noninjected. MMM.  Oropharynx is without erythema, exudates, or other abnormal lesions.  TM on R appears to have effusion behind it.  L TM clear. RESP:  Lungs are clear to ascultation bilaterally with good air movement.  No wheezes, ronchi, or rubs. CARDIOVASCULAR: regular rate, normal rhythm.  Clear S1, S2, no murmurs, gallops, or rubs. EXT: warm and dry.  Peripheral pulses equal, intact, and +2 globally.  No clubbing or cyanosis.  Mild nonpitting edema in b/l lower extremeties, 2 penny sized healed ulcers on L lateral leg, both with eschar SKIN: warm and dry with normal turgor.  No rashes or abnormal lesions observed.     Assessment:         Plan:

## 2011-12-31 NOTE — Assessment & Plan Note (Signed)
Patient continues to complain of pain throughout various parts of her body, today primarily in the bilateral shins and feet, and lower back. She does have a TENS that provides her some relief. She also has Lyrica and Lexapro and tramadol. She has had chronic pain for over 10 years. It is likely multifactorial, a combination of peripheral neuropathy, claudication, depression/anxiety, and orthopedic problems. She attends physical therapy twice a week for rehabilitation of her left shoulder, which was operated on in September. She attends physical therapy also twice a week for her hip and back, for a total of 4 days a week of physical therapy. We discussed the importance of continuing moving around for both claudication type pain and fibromyalgia type pain, and PT is an important part of this regimen.

## 2011-12-31 NOTE — Assessment & Plan Note (Signed)
Patient continues to have right maxillary sinus congestion, right ear pain, and tinnitus. She was seen roughly one month ago with similar URI type symptoms, but she has not improved substantially despite Flonase use. Given her lack of improvement, I will prescrib her a Z-Pak. Her tinnitus may be secondary to ear congestion, but is also possibly secondary to aspirin use versus another undiagnosed condition. Fairly nonspecific finding.

## 2011-12-31 NOTE — Assessment & Plan Note (Signed)
Feeling okay, denies feeling sad. Had increased dose of citalopram to 20 mg up from 10 back in November. We'll continue on 20 for now.

## 2011-12-31 NOTE — Assessment & Plan Note (Signed)
Sleep was a major topic of today's visit. The patient has been sleeping poorly, often going anywhere from 24-72 hours without sleeping. She has no sleep schedule whatsoever, going to bed and waking up at random times throughout the day. I counseled her extensively on the importance of sleep hygiene and a good sleep routine. She does have trazodone that she takes 3-4 times a week which works for her, but she doesn't like to take it every day because she tries to avoid medicines. We talked about a trial of using trazodone for 4 days straight, setting the morning alarm, and trying to sleep the same hours 4 consecutive days. We also talked about basic sleep hygiene techniques. There is no question that this patient's chronic pain, depression, and insomnia are all intertwined. We will followup and see how she is sleeping at next visit.

## 2011-12-31 NOTE — Assessment & Plan Note (Signed)
Last A1c in December 2012 was 9.0 patient continues to take insulin 75/25 120 units in the morning and 65 units in the evening. Patient did bring in meter today, which shows very volatile blood sugars. She's not due for an A1c today, and diabetes was not the focus of today's visit. We will recheck an A1c at next visit. She does have barriers to care including living in a hotel with her 53 year old son, not having been, and difficulty avoiding fried foods and that is what is served at [employer]. She is waiting on her section 8, and is currently on disability.

## 2012-01-01 ENCOUNTER — Encounter: Payer: Medicare Other | Admitting: Physical Therapy

## 2012-01-06 ENCOUNTER — Encounter: Payer: Medicare Other | Admitting: Physical Therapy

## 2012-01-08 ENCOUNTER — Ambulatory Visit: Payer: Medicare Other | Admitting: Physical Therapy

## 2012-01-13 ENCOUNTER — Other Ambulatory Visit: Payer: Self-pay | Admitting: *Deleted

## 2012-01-13 ENCOUNTER — Other Ambulatory Visit: Payer: Self-pay | Admitting: Internal Medicine

## 2012-01-13 DIAGNOSIS — F329 Major depressive disorder, single episode, unspecified: Secondary | ICD-10-CM

## 2012-01-13 DIAGNOSIS — F32A Depression, unspecified: Secondary | ICD-10-CM

## 2012-01-13 MED ORDER — PREGABALIN 100 MG PO CAPS: 100.0000 mg | ORAL_CAPSULE | Freq: Three times a day (TID) | ORAL | Status: DC

## 2012-01-13 MED ORDER — HYDROCHLOROTHIAZIDE 25 MG PO TABS: 25.0000 mg | ORAL_TABLET | Freq: Every day | ORAL | Status: DC

## 2012-01-13 NOTE — Telephone Encounter (Signed)
Dr Eben Burow started 11/12. Will refill once and have PCP address at Feb appt to see if appropriate to continue to use

## 2012-01-14 NOTE — Telephone Encounter (Signed)
lyrica called in 

## 2012-01-20 ENCOUNTER — Ambulatory Visit: Payer: Medicare Other | Admitting: Physical Therapy

## 2012-01-27 ENCOUNTER — Ambulatory Visit: Payer: Medicare Other | Attending: Internal Medicine | Admitting: Physical Therapy

## 2012-01-27 DIAGNOSIS — M545 Low back pain, unspecified: Secondary | ICD-10-CM | POA: Insufficient documentation

## 2012-01-27 DIAGNOSIS — M5137 Other intervertebral disc degeneration, lumbosacral region: Secondary | ICD-10-CM | POA: Insufficient documentation

## 2012-01-27 DIAGNOSIS — M51379 Other intervertebral disc degeneration, lumbosacral region without mention of lumbar back pain or lower extremity pain: Secondary | ICD-10-CM | POA: Insufficient documentation

## 2012-01-27 DIAGNOSIS — R269 Unspecified abnormalities of gait and mobility: Secondary | ICD-10-CM | POA: Insufficient documentation

## 2012-01-27 DIAGNOSIS — IMO0001 Reserved for inherently not codable concepts without codable children: Secondary | ICD-10-CM | POA: Insufficient documentation

## 2012-01-29 ENCOUNTER — Ambulatory Visit: Payer: Medicare Other | Admitting: Physical Therapy

## 2012-02-03 ENCOUNTER — Encounter: Payer: Self-pay | Admitting: Internal Medicine

## 2012-02-03 ENCOUNTER — Ambulatory Visit (INDEPENDENT_AMBULATORY_CARE_PROVIDER_SITE_OTHER): Payer: Medicare Other | Admitting: Internal Medicine

## 2012-02-03 ENCOUNTER — Ambulatory Visit: Payer: Medicare Other | Admitting: Dietician

## 2012-02-03 DIAGNOSIS — I10 Essential (primary) hypertension: Secondary | ICD-10-CM

## 2012-02-03 DIAGNOSIS — E1169 Type 2 diabetes mellitus with other specified complication: Secondary | ICD-10-CM

## 2012-02-03 DIAGNOSIS — E1165 Type 2 diabetes mellitus with hyperglycemia: Secondary | ICD-10-CM

## 2012-02-03 DIAGNOSIS — G894 Chronic pain syndrome: Secondary | ICD-10-CM

## 2012-02-03 DIAGNOSIS — L0291 Cutaneous abscess, unspecified: Secondary | ICD-10-CM

## 2012-02-03 DIAGNOSIS — IMO0002 Reserved for concepts with insufficient information to code with codable children: Secondary | ICD-10-CM

## 2012-02-03 DIAGNOSIS — H9319 Tinnitus, unspecified ear: Secondary | ICD-10-CM | POA: Insufficient documentation

## 2012-02-03 DIAGNOSIS — L97929 Non-pressure chronic ulcer of unspecified part of left lower leg with unspecified severity: Secondary | ICD-10-CM

## 2012-02-03 DIAGNOSIS — L0390 Cellulitis, unspecified: Secondary | ICD-10-CM

## 2012-02-03 DIAGNOSIS — L97909 Non-pressure chronic ulcer of unspecified part of unspecified lower leg with unspecified severity: Secondary | ICD-10-CM

## 2012-02-03 DIAGNOSIS — L97919 Non-pressure chronic ulcer of unspecified part of right lower leg with unspecified severity: Secondary | ICD-10-CM

## 2012-02-03 LAB — GLUCOSE, CAPILLARY: Glucose-Capillary: 68 mg/dL — ABNORMAL LOW (ref 70–99)

## 2012-02-03 MED ORDER — HYDROCODONE-ACETAMINOPHEN 5-500 MG PO TABS: 1.0000 | ORAL_TABLET | Freq: Four times a day (QID) | ORAL | Status: DC | PRN

## 2012-02-03 MED ORDER — DOXYCYCLINE HYCLATE 100 MG PO TABS: 100.0000 mg | ORAL_TABLET | Freq: Two times a day (BID) | ORAL | Status: AC

## 2012-02-03 NOTE — Assessment & Plan Note (Signed)
Did not have time to address this today.  Still elevated.  Likely needs titration/addition of meds.

## 2012-02-03 NOTE — Assessment & Plan Note (Signed)
Pt p/w b/l leg ulcers with possible superinfection.  Has h/o cellulitis in similar situation. - urgent appt to [facility] wound care - doxycyline - rtc in 3 weeks for wound check - if leg edema does not resolve after infxn clears, consider lasix/echo

## 2012-02-03 NOTE — Assessment & Plan Note (Signed)
Not due for A1c until next visit.  CBG's con't to be quite volatile.  Barriers to care include living situation, which will hopefully be changing with section 8 and moving to a new home. - a1c next visit - con't to check sugars

## 2012-02-03 NOTE — Assessment & Plan Note (Signed)
Pt c/o b/l tinnitus, L>R x 6 months.  Thought at last visit it might be related to URI, but it has persisted.  Pt describes intermittent pulsatile tinnitus as well as hearing loss.  The pulsatile nature is very concerning for a vascular cause, such as avm.  However, the concurrent hearing loss sx point more towards sensorineural prob.  I will start with a referral to audiometry and proceed with ENT referral in future pending those results. - referral to audiometry - f/u results as available - consdier ENT referral if needed

## 2012-02-03 NOTE — Progress Notes (Signed)
Subjective:     [name], female   DOB: 1961-09-05, 51 y.o.   MRN: [id number]  HPI 51 yo F with extensive PMH including DM, HTN, depression, chronic pain, COPD, and PVD who presents for f/u.  Overall pt is not doing well, feeling that she is "breaking at the seams".  Pain: Pt has finished PT for her back and walking.  I have not yet received any progress notes on that.  She con't her PT for her shoulder.  She takes 6-8 tramadol daily with minimal relief, and she says she feels unable to do normal activity.  As an example, she says it takes her over 20 min to walk what should take her 6-7 min.  Her lower back bothers her most, but her shoulder and neck bother her as well.  Tinnitus: Pt c/o 6 months tinnitus, that she did mention last visit as well.  It is intermittent, pulsatile, high pitched.  She endorses some hearing loss with having to increase the volume on the TV, having to look closely at people's mouths when they talk to her, and she has been told she speaks loudly.  She says occasionally the tinnitus is started by swallowing.  It occurs in both ears but L>R.  Legs: She has new ulcers opening up on the b/l legs.  Last month she finished with the wound care center and had only one small healing ulcer.  Since, there are multiple lesions on both legs, and they are painful.  DM: Her sugars con't to be quite volatile, and right before this appt she said she felt dizzy.  She ate some chips and coke and when checked today in office it was 66.  She is on insulin 70/30.  Sleep: she con't to have extremely poor sleep schedule, with difficulty falling asleep as well as staying asleep. Trazodone has been making her have morning grogginess.  Pt did recently receive section 8 and it planning on moving out of her hotel and into a permanent residence.  Review of Systems As per hpi    Objective:   Physical Exam Filed Vitals:   02/03/12 1522  BP: 165/75  Pulse: 65  Temp: 97.8 F  (36.6 C)  TempSrc: Oral  Height: 5\' 7"  (1.702 m)  Weight: 266 lb 14.4 oz (121.065 kg)   GEN: No apparent distress.  Alert and oriented x 3.  Pleasant, conversant, and cooperative to exam. HEENT: NCAT.  Neck is supple without palpable masses or lymphadenopathy.  EOMI.  PERRLA.  Sclerae anicteric.  Conjunctivae noninjected. MMM.  Oropharynx is without erythema, exudates, or other abnormal lesions. RESP:  Lungs are clear to ascultation bilaterally with good air movement.  No wheezes, ronchi, or rubs. CARDIOVASCULAR: regular rate, normal rhythm.  Clear S1, S2, no murmurs, gallops, or rubs. EXT: 4-5 ulcerated penny sized lesions on anterior b/l calves with surrounding erythema and edema.  Erythema borders lesions with up to 2cm extension in some places.     Assessment:         Plan:

## 2012-02-03 NOTE — Assessment & Plan Note (Signed)
Pt con't to have significant pain throughout her body.  Now finished with one of her two PT therapies.  She does not seem to have adequate relief from tramadol.  As I have said in prior notes, this pain is likely multifactorial and very difficult to manage. - con't lyrica and lexipro - trial of vicodin 5/500 #120 - consider pain contract at next visit if has relief

## 2012-02-03 NOTE — Assessment & Plan Note (Signed)
Has h/o cellulitis, now with new b/l leg lesions with surroudning erythema/edema concerning for cellulitis. - doxycycline 100mg  po bid x 10 days - [facility] - f/u in 3 weeks for wound check - rtc sooner if worsening

## 2012-02-09 ENCOUNTER — Encounter (HOSPITAL_BASED_OUTPATIENT_CLINIC_OR_DEPARTMENT_OTHER): Payer: Medicare Other | Attending: Internal Medicine

## 2012-02-09 DIAGNOSIS — K219 Gastro-esophageal reflux disease without esophagitis: Secondary | ICD-10-CM | POA: Insufficient documentation

## 2012-02-09 DIAGNOSIS — E1149 Type 2 diabetes mellitus with other diabetic neurological complication: Secondary | ICD-10-CM | POA: Insufficient documentation

## 2012-02-09 DIAGNOSIS — J449 Chronic obstructive pulmonary disease, unspecified: Secondary | ICD-10-CM | POA: Insufficient documentation

## 2012-02-09 DIAGNOSIS — Z7902 Long term (current) use of antithrombotics/antiplatelets: Secondary | ICD-10-CM | POA: Insufficient documentation

## 2012-02-09 DIAGNOSIS — J4489 Other specified chronic obstructive pulmonary disease: Secondary | ICD-10-CM | POA: Insufficient documentation

## 2012-02-09 DIAGNOSIS — E1142 Type 2 diabetes mellitus with diabetic polyneuropathy: Secondary | ICD-10-CM | POA: Insufficient documentation

## 2012-02-09 DIAGNOSIS — IMO0001 Reserved for inherently not codable concepts without codable children: Secondary | ICD-10-CM | POA: Insufficient documentation

## 2012-02-09 DIAGNOSIS — I872 Venous insufficiency (chronic) (peripheral): Secondary | ICD-10-CM | POA: Insufficient documentation

## 2012-02-09 DIAGNOSIS — Z79899 Other long term (current) drug therapy: Secondary | ICD-10-CM | POA: Insufficient documentation

## 2012-02-09 DIAGNOSIS — Z8673 Personal history of transient ischemic attack (TIA), and cerebral infarction without residual deficits: Secondary | ICD-10-CM | POA: Insufficient documentation

## 2012-02-09 DIAGNOSIS — E785 Hyperlipidemia, unspecified: Secondary | ICD-10-CM | POA: Insufficient documentation

## 2012-02-09 DIAGNOSIS — I10 Essential (primary) hypertension: Secondary | ICD-10-CM | POA: Insufficient documentation

## 2012-02-09 DIAGNOSIS — Z794 Long term (current) use of insulin: Secondary | ICD-10-CM | POA: Insufficient documentation

## 2012-02-09 DIAGNOSIS — L97809 Non-pressure chronic ulcer of other part of unspecified lower leg with unspecified severity: Secondary | ICD-10-CM | POA: Insufficient documentation

## 2012-02-09 DIAGNOSIS — I739 Peripheral vascular disease, unspecified: Secondary | ICD-10-CM | POA: Insufficient documentation

## 2012-02-13 ENCOUNTER — Telehealth: Payer: Self-pay | Admitting: *Deleted

## 2012-02-13 NOTE — Telephone Encounter (Addendum)
Will Rx nexium and await for appt for chair.  Thanks.  Returned pt's call.  Pt had already scheduled an appt w/PCP to discuss need for [facility] round chair, which is 03/23/12. Then pt states she needs something for acid reflux and Prilosec OTC is too expensive. Wants rx for Nexium; states She had talked to her PCP about this before. Thanks

## 2012-02-14 MED ORDER — ESOMEPRAZOLE MAGNESIUM 20 MG PO CPDR: 20.0000 mg | DELAYED_RELEASE_CAPSULE | Freq: Every day | ORAL | Status: DC

## 2012-02-16 ENCOUNTER — Ambulatory Visit: Payer: Medicare Other | Admitting: Audiology

## 2012-02-20 ENCOUNTER — Encounter (HOSPITAL_BASED_OUTPATIENT_CLINIC_OR_DEPARTMENT_OTHER): Payer: Medicare Other | Attending: Internal Medicine

## 2012-02-20 DIAGNOSIS — E119 Type 2 diabetes mellitus without complications: Secondary | ICD-10-CM | POA: Insufficient documentation

## 2012-02-20 DIAGNOSIS — Z794 Long term (current) use of insulin: Secondary | ICD-10-CM | POA: Insufficient documentation

## 2012-02-20 DIAGNOSIS — I872 Venous insufficiency (chronic) (peripheral): Secondary | ICD-10-CM | POA: Insufficient documentation

## 2012-02-20 DIAGNOSIS — Z79899 Other long term (current) drug therapy: Secondary | ICD-10-CM | POA: Insufficient documentation

## 2012-02-20 DIAGNOSIS — L97809 Non-pressure chronic ulcer of other part of unspecified lower leg with unspecified severity: Secondary | ICD-10-CM | POA: Insufficient documentation

## 2012-02-24 ENCOUNTER — Ambulatory Visit (INDEPENDENT_AMBULATORY_CARE_PROVIDER_SITE_OTHER): Payer: Medicare Other | Admitting: Internal Medicine

## 2012-02-24 ENCOUNTER — Other Ambulatory Visit: Payer: Self-pay | Admitting: Internal Medicine

## 2012-02-24 ENCOUNTER — Encounter: Payer: Self-pay | Admitting: Internal Medicine

## 2012-02-24 ENCOUNTER — Ambulatory Visit (INDEPENDENT_AMBULATORY_CARE_PROVIDER_SITE_OTHER): Payer: Medicare Other | Admitting: Dietician

## 2012-02-24 VITALS — BP 157/77 | HR 80 | Temp 97.6°F | Wt 269.9 lb

## 2012-02-24 DIAGNOSIS — E1165 Type 2 diabetes mellitus with hyperglycemia: Secondary | ICD-10-CM

## 2012-02-24 DIAGNOSIS — Z79899 Other long term (current) drug therapy: Secondary | ICD-10-CM

## 2012-02-24 DIAGNOSIS — E1169 Type 2 diabetes mellitus with other specified complication: Secondary | ICD-10-CM

## 2012-02-24 DIAGNOSIS — H9319 Tinnitus, unspecified ear: Secondary | ICD-10-CM

## 2012-02-24 DIAGNOSIS — L0390 Cellulitis, unspecified: Secondary | ICD-10-CM

## 2012-02-24 DIAGNOSIS — G894 Chronic pain syndrome: Secondary | ICD-10-CM

## 2012-02-24 DIAGNOSIS — I10 Essential (primary) hypertension: Secondary | ICD-10-CM

## 2012-02-24 DIAGNOSIS — IMO0002 Reserved for concepts with insufficient information to code with codable children: Secondary | ICD-10-CM

## 2012-02-24 DIAGNOSIS — L0291 Cutaneous abscess, unspecified: Secondary | ICD-10-CM

## 2012-02-24 LAB — GLUCOSE, CAPILLARY: Glucose-Capillary: 178 mg/dL — ABNORMAL HIGH (ref 70–99)

## 2012-02-24 LAB — POCT GLYCOSYLATED HEMOGLOBIN (HGB A1C): Hemoglobin A1C: 8.5

## 2012-02-24 MED ORDER — LISINOPRIL 20 MG PO TABS
40.0000 mg | ORAL_TABLET | Freq: Every day | ORAL | Status: DC
Start: 2012-02-24 — End: 2012-03-23

## 2012-02-24 MED ORDER — OXYCODONE-ACETAMINOPHEN 7.5-325 MG PO TABS
1.0000 | ORAL_TABLET | Freq: Four times a day (QID) | ORAL | Status: DC | PRN
Start: 2012-02-24 — End: 2012-03-09

## 2012-02-24 NOTE — Patient Instructions (Signed)
Stopped taking tramadol and Vicodin Start taking Percocet 7.5-325mg  one tablet every 6 hour as needed for pain, if this pain medication works we will sign a Community education officer during next office visit Increase lisinopril to 40 mg one tablet daily Continue Humalog 75/25, 120 units in the morning and 65 units at dinnertime, make sure you inject insulin before you eat Continue hydrochlorothiazide 25 mg by mouth daily Continue checking your blood sugar at least 2-3 times daily and bring your meter to the next office visit Followup with Dr. Anselm Jungling in 2 weeks

## 2012-02-24 NOTE — Progress Notes (Signed)
Medical Nutrition Therapy:  Appt start time: 1430 PM end time:  1455 PM.  Assessment:  Primary concerns today: Blood sugar control Blood sugar average 248 per meter download, still drinking regular soda. A1C decreased to 8.5%- per patient this is due to taking insulin and checking blood sugar more regularly, and drinking more water. Takes 185-195 units of insulin per day. Improvement expected this month in her social situation that continues to impede self care goals of more activity and weight loss.  A1C improving.  Tries to limit : soda and potato chips .    Usual physical activity includes ADLs only with foot, leg and shoulder injuries   Progress Towards Goal(s):  In progress   Nutritional Diagnosis:   NB-1.1 Food and nutrition-related knowledge deficit improving as patient better able to verbalize more foods that raise blood sugar as evidenced by patient report and questions however needs much reinforcement  NB-1.4 Self-monitoring deficit  As related to not checking blood sugar at least twice a day  Improving As evidenced by meter download with 30 checks in 30 days.  NB 1.3 Not ready for lifestyle change(diet/exercise)  as related to social situation as evidenced by her report of having sufficient stress at present with getting her own home.     Intervention: 1- C 2.2 Goal setting: review of previous goals: not ready for change in eating, smoking 2- Social support provided, coordination of care- discussed care with physician. 3- C 2.8 cognitive restructuring- encouraged continued positive self feedback    Monitoring/Evaluation:  Dietary intake and meter download  In 4  Week(s) per patient request

## 2012-02-24 NOTE — Assessment & Plan Note (Signed)
Not well-controlled. Blood pressure on arrival was in the 170/80's.  Repeat blood pressure was 157/77. Patient reports that she has not taken her blood pressure today.  I still think that her blood pressure is not well controlled and that we need to increase the dosage of her lisinopril. -Will increase lisinopril to 40 mg by mouth daily -Continue hydrochlorothiazide 25 mg by mouth daily -I will followup in 2 weeks for repeat blood pressure

## 2012-02-24 NOTE — Assessment & Plan Note (Signed)
Improving. Hemoglobin A1c today is 8.5 trending down from 9.9 in September of 2012. Patient has a history of volatile blood sugar control according to her primary care physician. She has been taking Humalog75/25: 120 units in the morning and 65 units at night, in addition she also gives herself extra units of insulin when her blood sugar is elevated (about 10 units extra per day). -I will not change her regimen at this time as her hemoglobin A1c is trending down nicely with her titrating her insulin at home -We discussed diet and exercise -Continue ACEi -Will repeat hemoglobin A1c in 3 months -She is also working closely with [employer].  [name] did see the patient during the office visit today

## 2012-02-24 NOTE — Assessment & Plan Note (Signed)
She continues to have tinnitus in bilateral ear that is pulsing, intermittent.  Patient did have an cardiogram on there were 20th 2013 and is recommended that patient be evaluated by an ENT physician. -Will refer to ENT

## 2012-02-24 NOTE — Progress Notes (Signed)
History of present illness: Jennifer [name] is a 51 year old woman with past medical history of depression, diabetes, GERD, hyperlipidemia, hypertension, presents today for followup. She reports that her cellulitis has improved significantly and that she has completed her doxycycline course. She has been followed at the wound care clinic and was seen earlier today. She does not want me to unwrap her lower extremity. She did go for her audiogram on 2/25th and needs a referral for an ENT physician because she still has loud tinnitus in both of her ears intermittently with mild hearing loss. As for her pain, patient has stopped taking the tramadol and started taking Vicodin after last office visit. However she reports itching while taking Vicodin therefore she wants to either increase the tramadol dose or start a no other pain medication. She's not able to perform her normal daily function without pain medication due to her multiple joint pains including her neck, lower back, or wrist joints, shoulder.  She does try other NSAIDs in the past without much relief. No other complaints today.  Medial reviewed: Glucose average of 248 ranging from 90''s to 450's. No episode of hypoglycemia documented.  Review of system: As per history of present illness  Physical examination: General: alert, well-developed, and cooperative to examination.  Lungs: normal respiratory effort, no accessory muscle use, normal breath sounds, no crackles, and no wheezes. Heart: normal rate, regular rhythm, no murmur, no gallop, and no rub.  Abdomen: soft, non-tender, normal bowel sounds, no distention, no guarding, no rebound tenderness Extremities: Not able to exam because patient refused to take off her ACE wrap Neurologic: nonfocal, moving all 4 extremities Psych: Oriented X3, memory intact for recent and remote, normally interactive, good eye contact, not anxious appearing, and not depressed appearing.

## 2012-02-24 NOTE — Assessment & Plan Note (Addendum)
Patient reports resolution of her cellulitis. She has completed her doxycycline course. Patient was seen at the wound care clinic earlier today and both of her lower extremities are nicely wrapped; therefore, patient does not want me to unwrap her lower legs. -Will continue wound care -Leg elevations above her heart level to reduce edema

## 2012-02-24 NOTE — Assessment & Plan Note (Signed)
Patient states that the tramadol strength was not strong enough however she was taking 100 mg every 6 hours.  She was given a prescription of Vicodin in which she report itching and does not want to be on Vicodin. She has also try NSAIDs in the past without much relief for her multiple joints of osteoarthritis, left shoulder adhesive capsulitis.  I had a long discussion with patient and she would like to try a trial of Percocet. If the Percocet does work well for her, we will signed contract at next office visit as well as doing random UDS in which patient agrees.   -Percocet 7.5/325 mg by mouth every 6 hour when necessary pain #120 with no refill -Continue physical therapy for her adhesive capsulitis of the left shoulder

## 2012-02-24 NOTE — Patient Instructions (Signed)
CONGRATS on the new house and lowering your A1C!!!   In the next few weeks please:  Keep track as much as possible of insulin amounts.  Try to remember to take insulin before eating- to prevent high blood sugars rather than allow  Your blood sugar to get high then having got take large amounts  to lower it.  Consider calling [employer] about Silver Sneakers program.  May also want to talk to Dr. Jimmey Ralph about what is causing your leg wounds and what you can do to prevent it from happening again.

## 2012-02-25 ENCOUNTER — Telehealth: Payer: Self-pay | Admitting: Dietician

## 2012-02-26 ENCOUNTER — Other Ambulatory Visit: Payer: Self-pay | Admitting: *Deleted

## 2012-02-27 NOTE — Telephone Encounter (Signed)
Empty encounter

## 2012-02-29 ENCOUNTER — Other Ambulatory Visit: Payer: Self-pay | Admitting: Internal Medicine

## 2012-03-03 NOTE — Telephone Encounter (Signed)
Unable to reach by phone. Will send letter.

## 2012-03-05 ENCOUNTER — Telehealth: Payer: Self-pay | Admitting: *Deleted

## 2012-03-05 ENCOUNTER — Other Ambulatory Visit: Payer: Self-pay | Admitting: Internal Medicine

## 2012-03-05 DIAGNOSIS — I739 Peripheral vascular disease, unspecified: Secondary | ICD-10-CM

## 2012-03-05 MED ORDER — CLOPIDOGREL BISULFATE 75 MG PO TABS: 75.0000 mg | ORAL_TABLET | Freq: Every day | ORAL | Status: DC

## 2012-03-05 NOTE — Telephone Encounter (Signed)
Fax from [facility].  Refill request for Clopidogrel 75mg  - Take 1 tab every day  Qty#90 Last filled 01/20/12

## 2012-03-05 NOTE — Telephone Encounter (Signed)
Plavix appears to have fallen off of her list.  She is on it to maintain LE stent patency.  Have therefore renewed.

## 2012-03-08 ENCOUNTER — Encounter (HOSPITAL_BASED_OUTPATIENT_CLINIC_OR_DEPARTMENT_OTHER): Payer: Medicare Other

## 2012-03-09 ENCOUNTER — Ambulatory Visit (INDEPENDENT_AMBULATORY_CARE_PROVIDER_SITE_OTHER): Payer: Medicare Other | Admitting: Internal Medicine

## 2012-03-09 VITALS — BP 152/78 | HR 59 | Temp 98.8°F | Wt 274.5 lb

## 2012-03-09 DIAGNOSIS — E1165 Type 2 diabetes mellitus with hyperglycemia: Secondary | ICD-10-CM

## 2012-03-09 DIAGNOSIS — E1169 Type 2 diabetes mellitus with other specified complication: Secondary | ICD-10-CM

## 2012-03-09 DIAGNOSIS — I10 Essential (primary) hypertension: Secondary | ICD-10-CM

## 2012-03-09 DIAGNOSIS — IMO0002 Reserved for concepts with insufficient information to code with codable children: Secondary | ICD-10-CM

## 2012-03-09 DIAGNOSIS — G894 Chronic pain syndrome: Secondary | ICD-10-CM

## 2012-03-09 LAB — GLUCOSE, CAPILLARY: Glucose-Capillary: 109 mg/dL — ABNORMAL HIGH (ref 70–99)

## 2012-03-09 MED ORDER — OXYCODONE-ACETAMINOPHEN 7.5-325 MG PO TABS: 1.0000 | ORAL_TABLET | Freq: Four times a day (QID) | ORAL | Status: DC | PRN

## 2012-03-09 NOTE — Assessment & Plan Note (Signed)
HbA1c was 8.5 on 02/24/12.  Improving from previous.  Her blood sugars continue to be volatile as she gets as low as 64 and high of 601.  She did bring her meter to the office today and average glucose was about 228.   Again, I will not change her regimen today -Will continue Humalog 75/25, 120 units in AM and 65 units at night -Continue to work with [name] [place] for better diet & also exercise -Repeat HbA1c in June -Continue ACEi -Patient was instructed to drink juice, eat crackers, sugar tablets if she develops symptoms of hypoglycemia such as tremor, palpitations, and anxiety/arousal (catecholamine-mediated, adrenergic) and sweating, hunger, and paresthesias (acetylcholine-mediated, cholinergic)

## 2012-03-09 NOTE — Assessment & Plan Note (Signed)
mMultiple joints of osteoarthritis, left shoulder adhesive capsulitis. Patient tried a trial of Percocet which works very well for her and would like to sign pain contract today. Will do random UDS in which patient agrees.  -Continue Percocet 7.5/325 mg by mouth every 6 hour when necessary pain #120, next RF will be 03/26/2012, I've already printed out 3 months prescriptions and gave to triage nurse's office -Continue physical therapy for her adhesive capsulitis of the left shoulder -Patient signed pain contract today for: Percocet 7.5/325 mg by mouth every 6 hour when necessary pain #120

## 2012-03-09 NOTE — Assessment & Plan Note (Signed)
Not well controlled which is likely 2/2 to stress and being angry at her son today.  Repeat BP was 152/78.  I think her actual BP is probably lower than this so will not change her regimen today.  If her BP continues to be elevated at next office visit, will likely need to add another BP agent such as CCB for better control given her risk factor of DM, goal would be less than 130/80. She does have a follow up appt with her PCP in 2 weeks. -Will continue current regimen at this time: atenolol 100mg  qd, lisinopril 40mg  qd, hctz 25mg  qd -May need to repeat BMP next office visit to make sure her electrolytes are wnl

## 2012-03-09 NOTE — Progress Notes (Signed)
History of present illness: Jennifer [name] is a 51 year old woman with past medical history of depression, diabetes, GERD, hyperlipidemia, hypertension, presents today for blood pressure followup. Patient has been compliant with her new blood pressure medication including lisinopril 40 mg and HCTZ 25 mg. However she states that she got into an argument with her son today and is still very upset & heated. As for her blood sugar, she had one episode of hypoglycemia with CBG 64 on 3/15 and as high as 601, averaging 228. She states that the new pain medication-Percocet is working well for her without any itching and she takes about 3-4 daily but sometimes she skips doses.  No other complaints today.  ROS: as per HPI  PE:  General: alert, well-developed, and cooperative to examination.  Lungs: normal respiratory effort, no accessory muscle use, normal breath sounds, no crackles, and no wheezes. Heart: normal rate, regular rhythm, no murmur, no gallop, and no rub.  Abdomen: soft, non-tender, normal bowel sounds, no distention, no guarding, no rebound tenderness Extremities: LLE: healed scars with pink granulation tissue, no drainage, erythema or increased in warmth.  She was wearing compression stocking as well. Neurologic: nonfocal, moving all 4 extremities Psych: Oriented X3, memory intact for recent and remote, normally interactive, good eye contact, not anxious appearing, and not depressed appearing.

## 2012-03-09 NOTE — Patient Instructions (Signed)
Continue current medication as prescribed Followup with your primary care physician in April for repeat blood pressure

## 2012-03-23 ENCOUNTER — Ambulatory Visit (INDEPENDENT_AMBULATORY_CARE_PROVIDER_SITE_OTHER): Payer: Medicare Other | Admitting: Internal Medicine

## 2012-03-23 ENCOUNTER — Encounter: Payer: Self-pay | Admitting: Internal Medicine

## 2012-03-23 VITALS — BP 166/80 | HR 63 | Temp 96.9°F | Ht 67.0 in | Wt 264.4 lb

## 2012-03-23 DIAGNOSIS — H9319 Tinnitus, unspecified ear: Secondary | ICD-10-CM

## 2012-03-23 DIAGNOSIS — IMO0002 Reserved for concepts with insufficient information to code with codable children: Secondary | ICD-10-CM

## 2012-03-23 DIAGNOSIS — I739 Peripheral vascular disease, unspecified: Secondary | ICD-10-CM

## 2012-03-23 DIAGNOSIS — I10 Essential (primary) hypertension: Secondary | ICD-10-CM

## 2012-03-23 DIAGNOSIS — D229 Melanocytic nevi, unspecified: Secondary | ICD-10-CM

## 2012-03-23 DIAGNOSIS — G894 Chronic pain syndrome: Secondary | ICD-10-CM

## 2012-03-23 DIAGNOSIS — E1165 Type 2 diabetes mellitus with hyperglycemia: Secondary | ICD-10-CM

## 2012-03-23 DIAGNOSIS — D239 Other benign neoplasm of skin, unspecified: Secondary | ICD-10-CM

## 2012-03-23 DIAGNOSIS — J449 Chronic obstructive pulmonary disease, unspecified: Secondary | ICD-10-CM

## 2012-03-23 LAB — BASIC METABOLIC PANEL
BUN: 15 mg/dL (ref 6–23)
CO2: 28 mEq/L (ref 19–32)
Calcium: 9.3 mg/dL (ref 8.4–10.5)
Chloride: 107 mEq/L (ref 96–112)
Creat: 0.76 mg/dL (ref 0.50–1.10)
Glucose, Bld: 131 mg/dL — ABNORMAL HIGH (ref 70–99)
Potassium: 4.1 mEq/L (ref 3.5–5.3)
Sodium: 145 mEq/L (ref 135–145)

## 2012-03-23 LAB — GLUCOSE, CAPILLARY: Glucose-Capillary: 147 mg/dL — ABNORMAL HIGH (ref 70–99)

## 2012-03-23 MED ORDER — PREDNISONE 10 MG PO TABS: ORAL_TABLET | ORAL | Status: DC

## 2012-03-23 MED ORDER — AMLODIPINE BESY-BENAZEPRIL HCL 10-40 MG PO CAPS: 1.0000 | ORAL_CAPSULE | Freq: Every day | ORAL | Status: DC

## 2012-03-23 MED ORDER — DOXYCYCLINE HYCLATE 100 MG PO TABS: 100.0000 mg | ORAL_TABLET | Freq: Two times a day (BID) | ORAL | Status: DC

## 2012-03-23 NOTE — Progress Notes (Signed)
Subjective:     [name], female   DOB: [birth year], 51 y.o.   MRN: [id number]  HPI Patient is a 51 year old woman with extensive PMH including diabetes, hypertension, depression, chronic pain, COPD, and PVD who presents for followup.  Housing: Patient recently moved into an apartment with her son. Had been formally living in a motel, and she is thrilled with this new upgrade.  Cough: Patient complains of 2 weeks cough with yellow sputum production, subjective fevers and chills. She's been taking her albuterol with moderate relief. She is not taking Advair, and she expresses concern about the steroids leading to weight gain. She does have a history of COPD confirmed by PFTs per her report, but I cannot find them in epic.  Legs: At my last visit 2 months ago the patient had bilateral cellulitis around open ulcers on the calves. She had a course of doxycycline and responded well. She reports no open wounds or erythema.  Tinnitus: Patient had an abnormal audiometry evaluation and was referred to ENT. She saw Dr. Ezzard Standing, but we have no correspondence from them. He gave her some medicine that she brought, but it is a multivitamin. She says she still has tenderness in the left worse than right.  Chronic pain: Patient signed a pain contract last visit for Percocet. She says it has been helping, but she still has some back pain.  Hypertension: Patient says she has been compliant with hypertension medicines.  Moles: Patient asking for dermatology referral regarding moles on her back and neck.  Review of Systems as per hpi    Objective:   Physical Exam Gen: NAD HEENT: 3mm flesh colored nevus in posterior midline of neck.  Acanthosis nicrigans on posterior neck. CV: RRR Chest: trace b/l end expiratory wheezes, no rhonchi Back: 4mm nevus, circular with regular borders.  Brown in color with slight hypopigmentation in center. Legs: healing b/l calf ulcers, no active lesions, no  erythema.    Assessment:         Plan:

## 2012-03-23 NOTE — Assessment & Plan Note (Signed)
Blood pressure today 170 systolic recheck to 166. Patient says she is compliant with her medicines. On maximum therapy of 3 medicines already. We'll have to add in a combination pill. - Continue atenolol 100 - Continue HCTZ 25 - Change lisinopril 40--> combination amlodipine 10/benazepril 40 - Check BMP today

## 2012-03-23 NOTE — Assessment & Plan Note (Signed)
Currently having increased cough and shortness of breath x2 weeks with mild increase in sputum production, seems like she may be having a COPD exacerbation versus bronchitis. She says she has had PFTs showing COPD in the past, though I cannot find them in epic this time (probably could find with more time). We will treat her for mild COPD exacerbation. -  Doxycycline 100 twice a day x7 days - Continue albuterol - Prednisone taper over 10 days - We discussed the importance of using Advair consistently. The patient had been reluctant because of her concerns for the steroids leading to weight gain, though I told her that it inhaled steroids should have minimal effect in that regard.

## 2012-03-23 NOTE — Assessment & Plan Note (Signed)
Patient asked for dermatology referral for removal of nevi on her neck and back because her discomfort when she was closed and when she hits them incidentally. The nevus on her posterior neck is small, fleshy color, and benign-appearing. The nevus on her back is also benign-appearing, though does have an area of hypopigmentation on the center and is slightly more raised than the rest of her nevi. I do think they're both benign, but if she desires them to be removed, and happy to refer her. - Dermatology referral

## 2012-03-23 NOTE — Assessment & Plan Note (Signed)
As per last visit with Dr. Bluford Kaufmann, was put on a pain contract for Percocet. The patient next refill today, dated 03/26/12 has a refill date. Patient says it is helping her somewhat, though her back continues to bother her. In particular, she cannot stand for long period of time, nor can she walk for long periods of time. She has completed a course of physical therapy for this problem, and they told her that nothing more to offer her. We discussed that weight loss is important, and continued use of her narcotic. She continues to have significant and/or worsening pain, we could consider referral to a specialist.

## 2012-03-23 NOTE — Assessment & Plan Note (Signed)
HbA1c was 8.5 on 02/24/12, improved from previous. Patient did not bring meter today, as she just moved and is not sure where her belongings are. - Continue Humalog 75/25, 120 units in a.m. and 65 units in p.m. - Continue to work with [name] Plyler - Repeat hemoglobin A1c in June - Continue ACE inhibitor - Continue blood pressure control, see separate problem

## 2012-03-23 NOTE — Assessment & Plan Note (Signed)
Patient had an abnormal audiometry evaluation and was referred to ENT. She saw Dr. Ezzard Standing, but I do not have records from that visit yet. She brought in a medicine she says he prescribed, but it is a multivitamin, and on not sure that is the exact therapy recommended. I will await his records, and in the meantime the patient says she does have followup with him.

## 2012-03-23 NOTE — Assessment & Plan Note (Signed)
Still on Plavix. Ulcers with cellulitis responded well to doxycycline back in February. Has been seen at wound care, and did well with leg cramps. Now wearing compression stockings for therapy.

## 2012-04-08 ENCOUNTER — Other Ambulatory Visit: Payer: Self-pay | Admitting: Internal Medicine

## 2012-04-15 NOTE — Progress Notes (Signed)
Addended by: Neomia Dear on: 04/15/2012 05:31 PM   Modules accepted: Orders

## 2012-04-16 ENCOUNTER — Ambulatory Visit: Payer: Medicare Other | Admitting: Internal Medicine

## 2012-04-21 ENCOUNTER — Encounter: Payer: Self-pay | Admitting: Internal Medicine

## 2012-04-21 ENCOUNTER — Ambulatory Visit (INDEPENDENT_AMBULATORY_CARE_PROVIDER_SITE_OTHER): Payer: Medicare Other | Admitting: Internal Medicine

## 2012-04-21 VITALS — BP 124/71 | HR 73 | Temp 98.0°F | Resp 20 | Ht 65.0 in | Wt 267.9 lb

## 2012-04-21 DIAGNOSIS — B370 Candidal stomatitis: Secondary | ICD-10-CM

## 2012-04-21 DIAGNOSIS — E119 Type 2 diabetes mellitus without complications: Secondary | ICD-10-CM

## 2012-04-21 DIAGNOSIS — B9689 Other specified bacterial agents as the cause of diseases classified elsewhere: Secondary | ICD-10-CM | POA: Insufficient documentation

## 2012-04-21 DIAGNOSIS — R251 Tremor, unspecified: Secondary | ICD-10-CM

## 2012-04-21 DIAGNOSIS — J019 Acute sinusitis, unspecified: Secondary | ICD-10-CM

## 2012-04-21 DIAGNOSIS — R259 Unspecified abnormal involuntary movements: Secondary | ICD-10-CM

## 2012-04-21 DIAGNOSIS — J449 Chronic obstructive pulmonary disease, unspecified: Secondary | ICD-10-CM

## 2012-04-21 LAB — GLUCOSE, CAPILLARY: Glucose-Capillary: 254 mg/dL — ABNORMAL HIGH (ref 70–99)

## 2012-04-21 MED ORDER — DOXYCYCLINE HYCLATE 100 MG PO TABS: 100.0000 mg | ORAL_TABLET | Freq: Two times a day (BID) | ORAL | Status: AC

## 2012-04-21 MED ORDER — NYSTATIN 100000 UNIT/ML MT SUSP: 500000.0000 [IU] | Freq: Four times a day (QID) | OROMUCOSAL | Status: AC

## 2012-04-21 NOTE — Patient Instructions (Signed)
Schedule followup appointment with your primary care provider, Dr. Abner Greenspan in the next 1-2 months There is no test to tell whether you have Parkinson's or not. We will have to see how you do over time. Keep a record of your tremors and other symptoms that you notice. Nystatin is a medicine to help treat the thrush (white stuff) on your tongue.  Use as directed until your symptoms are resolved. To prevent the thrush from recurring, be sure to rinse her mouth with water after use your Advair. If the thrush does not get better with nystatin or come back in the next few weeks, return to the clinic for reevaluation. Doxycycline as an antibiotic to help with your acute sinus infection. Please use as directed and be sure to complete the full 5 days of therapy. All [facility]

## 2012-04-21 NOTE — Progress Notes (Signed)
[name], female   DOB: 1961/09/09, 51 y.o.   MRN: [id number]  Subjective:     HPI: Patient is a 51 year old on here with acute complaint of worsening shaking in her upper extremities.  She reports symptoms for approximately one year.  She notices her symptoms are worse at rest..  She does not notice any alleviating factors.  Symptoms do not get worse when drinking a cup or writing.    She also notes twitching that also occurs in her legs.     Please see the A&P for the status of the pt's chronic medical problems and acute medical concernc.    Review of Systems Constitutional: Negative for fever, chills, diaphoresis, activity change, appetite change, fatigue and unexpected weight change.  HENT: Negative for hearing loss, congestion and neck stiffness.   Eyes: Negative for photophobia, pain and visual disturbance.  Respiratory: Negative for cough, chest tightness, shortness of breath and wheezing.   Cardiovascular: Negative for chest pain and palpitations.  Gastrointestinal: Negative for abdominal pain, blood in stool and anal bleeding.  Genitourinary: Negative for dysuria, hematuria and difficulty urinating.  Musculoskeletal: Negative for joint swelling.  Neurological: Negative for dizziness, syncope, speech difficulty, weakness, numbness and headaches. ]     Objective:   Physical Exam VItal signs reviewed  GEN: No apparent distress.  Alert and oriented x 3.  Pleasant, conversant, and cooperative to exam. HEENT: head is autraumatic and normocephalic. Mucous membranes are moist. Thick white, adherent patches noted on the patient's tongue and a membranes consistent with thrush.  Oropharynx otherwise within normal limits. Tenderness to palpation over her left maxillary and left frontal sinuses. RESP:  Lungs are clear to ascultation bilaterally with good air movement.  No wheezes, ronchi, or rubs. CARDIOVASCULAR: regular rate, normal rhythm.  ABDOMEN: soft, non-tender,  non-distended.  Bowels sounds present in all quadrants and normoactive.  No palpable masses. EXT: warm and dry.  NEURO: CN II-XII grossly intact.  Muscle strength +5/5 in bilateral upper and lower extremities.  Sensation is grossly intact.  No focal deficit.  Some bradykinesia noted with mild shuffling gait is shortened and widened stride.  No tremor noted on exam     Assessment/Plan:

## 2012-04-21 NOTE — Assessment & Plan Note (Signed)
Patient approximately 10 days of increased sinus congestion, purulent sinues drainage, with headache, and cough productive of white sputum.  This is consistent with an acute bacterial sinusitis. She is a candidate for anterior therapy given the duration of her symptoms; will treat with a 5 day course of doxycycline. Patient is advised to return to clinic to be reevaluation she developed fever, chills, worsening sinus congestion, hemoptysis, epistaxis, or other concerning symptoms.

## 2012-04-21 NOTE — Assessment & Plan Note (Signed)
The patient reports a tremor that is worse at rest and when relaxing and is not as notable when holding an object, or writing. This is suggestive of Parkinson's however she does not have any tremor on exam today. Shuffling gait is noted. Informed patient there is no way to determine whether her current symptoms are the result of the beginning of Parkinson's disease or if she is experiencing simple muscle twitches/tics.    Informed patient we will have to continue to monitor her symptoms over time before we will have a better sense of the etiology of her tremor. Advised her to maintain a diary about her symptoms

## 2012-04-21 NOTE — Assessment & Plan Note (Signed)
Impression exam. Likely the result of inhaled steroid use. Unfortunately and there is not amenable to use of spacer. Patient is advised to have her mouth with water after using her Advair. We'll also prescribe a course of nystatin swish and swallow. Patient is advised to return to clinic for reevaluation including HIV testing should her thrush not resolve or recur rapidly.

## 2012-04-23 ENCOUNTER — Other Ambulatory Visit: Payer: Self-pay | Admitting: *Deleted

## 2012-04-23 DIAGNOSIS — G894 Chronic pain syndrome: Secondary | ICD-10-CM

## 2012-04-23 NOTE — Telephone Encounter (Signed)
[facility] [address] on [facility] 8th so not due until May 8th.

## 2012-04-26 MED ORDER — OXYCODONE-ACETAMINOPHEN 7.5-325 MG PO TABS: 1.0000 | ORAL_TABLET | Freq: Four times a day (QID) | ORAL | Status: DC | PRN

## 2012-04-26 NOTE — Telephone Encounter (Signed)
Refill printed and signed - nurse to complete. 

## 2012-04-28 NOTE — Telephone Encounter (Signed)
Pt informed Rx ready 

## 2012-05-08 ENCOUNTER — Other Ambulatory Visit: Payer: Self-pay | Admitting: Internal Medicine

## 2012-05-10 ENCOUNTER — Other Ambulatory Visit: Payer: Self-pay | Admitting: Internal Medicine

## 2012-05-26 ENCOUNTER — Encounter: Payer: Self-pay | Admitting: Internal Medicine

## 2012-05-26 ENCOUNTER — Ambulatory Visit (INDEPENDENT_AMBULATORY_CARE_PROVIDER_SITE_OTHER): Payer: Medicare Other | Admitting: Internal Medicine

## 2012-05-26 ENCOUNTER — Other Ambulatory Visit: Payer: Self-pay | Admitting: Internal Medicine

## 2012-05-26 VITALS — BP 135/79 | HR 73 | Temp 98.7°F | Ht 65.0 in | Wt 272.0 lb

## 2012-05-26 DIAGNOSIS — J449 Chronic obstructive pulmonary disease, unspecified: Secondary | ICD-10-CM

## 2012-05-26 DIAGNOSIS — IMO0002 Reserved for concepts with insufficient information to code with codable children: Secondary | ICD-10-CM

## 2012-05-26 DIAGNOSIS — E1165 Type 2 diabetes mellitus with hyperglycemia: Secondary | ICD-10-CM

## 2012-05-26 DIAGNOSIS — Z79899 Other long term (current) drug therapy: Secondary | ICD-10-CM

## 2012-05-26 DIAGNOSIS — J4489 Other specified chronic obstructive pulmonary disease: Secondary | ICD-10-CM

## 2012-05-26 DIAGNOSIS — R635 Abnormal weight gain: Secondary | ICD-10-CM

## 2012-05-26 DIAGNOSIS — E785 Hyperlipidemia, unspecified: Secondary | ICD-10-CM

## 2012-05-26 DIAGNOSIS — R600 Localized edema: Secondary | ICD-10-CM

## 2012-05-26 DIAGNOSIS — I10 Essential (primary) hypertension: Secondary | ICD-10-CM

## 2012-05-26 DIAGNOSIS — F329 Major depressive disorder, single episode, unspecified: Secondary | ICD-10-CM

## 2012-05-26 DIAGNOSIS — F32A Depression, unspecified: Secondary | ICD-10-CM

## 2012-05-26 DIAGNOSIS — H9319 Tinnitus, unspecified ear: Secondary | ICD-10-CM

## 2012-05-26 DIAGNOSIS — G894 Chronic pain syndrome: Secondary | ICD-10-CM

## 2012-05-26 DIAGNOSIS — R609 Edema, unspecified: Secondary | ICD-10-CM

## 2012-05-26 DIAGNOSIS — E1169 Type 2 diabetes mellitus with other specified complication: Secondary | ICD-10-CM

## 2012-05-26 LAB — GLUCOSE, CAPILLARY: Glucose-Capillary: 245 mg/dL — ABNORMAL HIGH (ref 70–99)

## 2012-05-26 LAB — POCT GLYCOSYLATED HEMOGLOBIN (HGB A1C): Hemoglobin A1C: 8.3

## 2012-05-26 MED ORDER — PREGABALIN 100 MG PO CAPS: 100.0000 mg | ORAL_CAPSULE | Freq: Three times a day (TID) | ORAL | Status: DC

## 2012-05-26 MED ORDER — OXYCODONE-ACETAMINOPHEN 7.5-325 MG PO TABS: 1.0000 | ORAL_TABLET | Freq: Four times a day (QID) | ORAL | Status: DC | PRN

## 2012-05-26 MED ORDER — AMLODIPINE BESY-BENAZEPRIL HCL 10-40 MG PO CAPS: 1.0000 | ORAL_CAPSULE | Freq: Every day | ORAL | Status: DC

## 2012-05-26 NOTE — Assessment & Plan Note (Signed)
A1c today 8.3, nearly at goal.  Compliant with 75/25 insulin.  C/o weight gain (see separate problem) but otherwise doing well. - con't insulin 120 units in a.m., 65 units in p.m. - repeat a1c in 3 months - con't ACE - con't ASA

## 2012-05-26 NOTE — Assessment & Plan Note (Signed)
Lipid Panel     Component Value Date/Time   CHOL 150 10/09/2011 1158   TRIG 106 10/09/2011 1158   HDL 39* 10/09/2011 1158   CHOLHDL 3.8 10/09/2011 1158   VLDL 21 10/09/2011 1158   LDLCALC 90 10/09/2011 1158   LDL at goal on no meds.  Given pill burden will not add statin.

## 2012-05-26 NOTE — Assessment & Plan Note (Signed)
BP Readings from Last 3 Encounters:  05/26/12 135/79  04/21/12 124/71  03/23/12 166/80    Much closer to goal today, better controlled - con't atenolol - con't hctz - con't amlodipine/benaz - check CMP

## 2012-05-26 NOTE — Assessment & Plan Note (Signed)
Con't advair, flonase, and albuterol.  Had mild COPD exac in April, has resolved.

## 2012-05-26 NOTE — Assessment & Plan Note (Addendum)
Pt has gained 12 lb in 2 months and nearly 20 since the fall.  She says she is compliant with a good diet.  Insulin therapy could be contributing.  Worsening chronic pain could be contributing to increasing sedentary behavior.  No s/s of hypothryoid.  Seems to have some peripheral edema, which ddx includes CHF, venous stasis (favored), or hypoalbuminemia.  She does have risk factors for CV disease, and has had claudication in the past.  Last echo was 2007.  No h/o coronary events.  No r/o renal failure. - TSH - CMP - echo arranged 6/19 at 2pm

## 2012-05-26 NOTE — Assessment & Plan Note (Signed)
Has not been back to ENT b/c of financial reasons.  Meds rx'ed by Dr Ezzard Standing not helping.  Still has tinnitus.

## 2012-05-26 NOTE — Assessment & Plan Note (Signed)
On percocet and lyrica. Gets relief but incomplete.  Has so much pain in mornings that she can barely move.  Combo of arthritis, fibromyalgia, depression, neuropathy.  Has finished PT.  Weight gain not helping.  Not sure if she's been to pain clinic. - consider referral to pain clinic, did not get a chance to do it this visit

## 2012-05-26 NOTE — Progress Notes (Signed)
Subjective:     Patient ID: [name], female   DOB: 30-Aug-1961, 51 y.o.   MRN: [id number]  HPI Patient is a 51 year old woman with extensive PMH including diabetes, hypertension, depression, chronic pain, COPD, and PVD who presents for followup.  Was dx'ed with thrush last visit.  Has resolved, but still c/o some hoarseness in voice.  Has not been back to see ENT b/c of payment, and still has b/l tinnitus daily.  Describes weight gain despite good diet compliance and activity throughout the day.  Pain con't in back, hands, and legs.  Worst when she wakes up in a.m.  Percocet helps about 50%, lyrica helps some too, but not that pleased with overall results.  Con't to smoke 1/2-1 PPD.  Now has THN   Review of Systems     Objective:   Physical Exam Gen: obese, NAD, cooperative Chest: CTAB CV: RRR, no m/r/g Legs: b/l1+  pitting edema in b/l LE to knee     Assessment:         Plan:

## 2012-05-27 LAB — COMPLETE METABOLIC PANEL WITH GFR
ALT: 25 U/L (ref 0–35)
AST: 23 U/L (ref 0–37)
Albumin: 3.7 g/dL (ref 3.5–5.2)
Alkaline Phosphatase: 79 U/L (ref 39–117)
BUN: 13 mg/dL (ref 6–23)
CO2: 28 mEq/L (ref 19–32)
Calcium: 8.9 mg/dL (ref 8.4–10.5)
Chloride: 104 mEq/L (ref 96–112)
Creat: 0.77 mg/dL (ref 0.50–1.10)
GFR, Est African American: >89 mL/min
GFR, Est Non African American: 88 mL/min
Glucose, Bld: 235 mg/dL — ABNORMAL HIGH (ref 70–99)
Potassium: 4 mEq/L (ref 3.5–5.3)
Sodium: 142 mEq/L (ref 135–145)
Total Bilirubin: 0.2 mg/dL — ABNORMAL LOW (ref 0.3–1.2)
Total Protein: 6.3 g/dL (ref 6.0–8.3)

## 2012-05-27 LAB — TSH: TSH: 0.351 u[IU]/mL (ref 0.350–4.500)

## 2012-05-27 NOTE — Progress Notes (Signed)
Addended by: Daryel Gerald on: 05/27/2012 08:38 AM   Modules accepted: Orders

## 2012-06-02 ENCOUNTER — Telehealth: Payer: Self-pay | Admitting: Licensed Clinical Social Worker

## 2012-06-02 NOTE — Telephone Encounter (Signed)
Ms. Pagliaro returned call to CSW to inform this worker she is able to handle the current stressors in her life, but needs financial help with co-payment at doctor offices.  Pt and CSW had lengthy discussion regarding Ms. Lapoint's current stressors.  Pt has been referred to multiple specialist and can not continue to see them due to high co-pay due.  Pt complains offices are unwilling to set up a payment plan.  CSW informed pt of Extra Help program through Medicare but it appears pt is already receiving assistance with prescriptions and premiums.  CSW will explore resources and return call to Ms. [name].

## 2012-06-02 NOTE — Telephone Encounter (Signed)
Jennifer [name] was referred to CSW for counseling resources.  Pt carries a dx of depression and is not in counseling at this time, pt is on anti-depressant.  Pt has multiple medical issues and social stressors in addition to chronic pain.  CSW placed call to [name]. Pt is open to counseling but unable to right phone numbers of resource down at this time.  Pt requesting CSW to call at a later time.  CSW will call back and provide pt with numbers: [facility] [phone], [facility] [phone], and [facility] [phone].

## 2012-06-09 ENCOUNTER — Ambulatory Visit (HOSPITAL_COMMUNITY): Payer: Medicare Other

## 2012-06-16 ENCOUNTER — Ambulatory Visit (HOSPITAL_COMMUNITY)
Admission: RE | Admit: 2012-06-16 | Discharge: 2012-06-16 | Disposition: A | Discharge status: Home / Self Care | Payer: Medicare Other | Source: Ambulatory Visit | Attending: Internal Medicine | Admitting: Internal Medicine

## 2012-06-16 ENCOUNTER — Other Ambulatory Visit: Payer: Self-pay | Admitting: Internal Medicine

## 2012-06-16 DIAGNOSIS — E785 Hyperlipidemia, unspecified: Secondary | ICD-10-CM | POA: Insufficient documentation

## 2012-06-16 DIAGNOSIS — E119 Type 2 diabetes mellitus without complications: Secondary | ICD-10-CM | POA: Insufficient documentation

## 2012-06-16 DIAGNOSIS — J449 Chronic obstructive pulmonary disease, unspecified: Secondary | ICD-10-CM | POA: Insufficient documentation

## 2012-06-16 DIAGNOSIS — R609 Edema, unspecified: Secondary | ICD-10-CM

## 2012-06-16 DIAGNOSIS — J4489 Other specified chronic obstructive pulmonary disease: Secondary | ICD-10-CM | POA: Insufficient documentation

## 2012-06-16 DIAGNOSIS — F17200 Nicotine dependence, unspecified, uncomplicated: Secondary | ICD-10-CM | POA: Insufficient documentation

## 2012-06-16 DIAGNOSIS — I10 Essential (primary) hypertension: Secondary | ICD-10-CM | POA: Insufficient documentation

## 2012-06-17 NOTE — Telephone Encounter (Signed)
CSW was unable to find any programs specialist copays at this time.  CSW notified Ms. [name].

## 2012-06-28 ENCOUNTER — Other Ambulatory Visit: Payer: Self-pay | Admitting: *Deleted

## 2012-06-28 ENCOUNTER — Telehealth: Payer: Self-pay | Admitting: *Deleted

## 2012-06-28 DIAGNOSIS — G894 Chronic pain syndrome: Secondary | ICD-10-CM

## 2012-06-28 MED ORDER — OXYCODONE-ACETAMINOPHEN 7.5-325 MG PO TABS: 1.0000 | ORAL_TABLET | Freq: Four times a day (QID) | ORAL | Status: DC | PRN

## 2012-06-28 NOTE — Telephone Encounter (Signed)
Rx given to pt. 

## 2012-06-28 NOTE — Telephone Encounter (Signed)
Refill printed and signed - nurse to complete. 

## 2012-06-28 NOTE — Telephone Encounter (Signed)
Rxs x 2 dated Mar 19,2013 voided and to be scanned per Dr Meredith Pel.

## 2012-06-28 NOTE — Telephone Encounter (Signed)
Wanted to check if pt was on Lisinopri - on Epic was stopped 03/23/2012. According to notes 05/26/12 pt should be on atenolol, hctz and amlodipine/ben. Jennifer Kidney Saara Kijowski RN 06/28/12 2:45PM

## 2012-07-02 ENCOUNTER — Encounter (HOSPITAL_BASED_OUTPATIENT_CLINIC_OR_DEPARTMENT_OTHER): Payer: Medicare Other | Attending: General Surgery

## 2012-07-02 DIAGNOSIS — Z794 Long term (current) use of insulin: Secondary | ICD-10-CM | POA: Insufficient documentation

## 2012-07-02 DIAGNOSIS — E1169 Type 2 diabetes mellitus with other specified complication: Secondary | ICD-10-CM | POA: Insufficient documentation

## 2012-07-02 DIAGNOSIS — G589 Mononeuropathy, unspecified: Secondary | ICD-10-CM | POA: Insufficient documentation

## 2012-07-02 DIAGNOSIS — J449 Chronic obstructive pulmonary disease, unspecified: Secondary | ICD-10-CM | POA: Insufficient documentation

## 2012-07-02 DIAGNOSIS — I10 Essential (primary) hypertension: Secondary | ICD-10-CM | POA: Insufficient documentation

## 2012-07-02 DIAGNOSIS — Z8673 Personal history of transient ischemic attack (TIA), and cerebral infarction without residual deficits: Secondary | ICD-10-CM | POA: Insufficient documentation

## 2012-07-02 DIAGNOSIS — F17200 Nicotine dependence, unspecified, uncomplicated: Secondary | ICD-10-CM | POA: Insufficient documentation

## 2012-07-02 DIAGNOSIS — Z79899 Other long term (current) drug therapy: Secondary | ICD-10-CM | POA: Insufficient documentation

## 2012-07-02 DIAGNOSIS — L97509 Non-pressure chronic ulcer of other part of unspecified foot with unspecified severity: Secondary | ICD-10-CM | POA: Insufficient documentation

## 2012-07-02 DIAGNOSIS — I739 Peripheral vascular disease, unspecified: Secondary | ICD-10-CM | POA: Insufficient documentation

## 2012-07-02 DIAGNOSIS — J4489 Other specified chronic obstructive pulmonary disease: Secondary | ICD-10-CM | POA: Insufficient documentation

## 2012-07-02 NOTE — Progress Notes (Signed)
[facility]  NAME:  Jennifer [name], Jennifer [name] NO.:  [id number]  MEDICAL RECORD NO.:  [id number]      DATE OF BIRTH:  Nov 28, 1961  PHYSICIAN:  Ardath Sax, M.D.           VISIT DATE:                                  OFFICE VISIT   This lady is an old patient here who returns because of a diabetic-type ulcer on the right instep.  It is about 1 cm in diameter.  This lady has had great problems with her health.  She is morbidly obese, has hypertension.  She is on many medicines including Plavix, atenolol, lisinopril, hydrochlorothiazide, Lyrica, tramadol, Humulin, insulin, trazodone, oxycodone, and aspirin.  She has had a hysterectomy.  She has had stents placed in both of her femoral arteries.  She is a smoker, has peripheral vascular disease, COPD, type 2 diabetes, neuropathy.  She has had TIAs in the past, and has had a long history of venous stasis ulcers that we healed with the Unna boots.  Today, she comes in with a 1-cm ulcer on her left instep that is about 1 cm in diameter.  I debrided it, and we are treating it with silver alginate daily, and we will see her back here in a week.  She was afebrile when she was here.  Her blood pressure was 140/90, her pulse is 70, and respirations are 18.  She weighs almost 300 pounds.  We will see her back here in a week, and it is possible she may be a candidate for Dermagraft.     Ardath Sax, M.D.     PP/MEDQ  D:  07/02/2012  T:  07/02/2012  Job:  [id number]

## 2012-07-05 ENCOUNTER — Ambulatory Visit: Payer: Medicare Other | Admitting: Dietician

## 2012-07-05 ENCOUNTER — Encounter: Payer: Medicare Other | Admitting: Internal Medicine

## 2012-07-09 ENCOUNTER — Encounter: Payer: Medicare Other | Admitting: Internal Medicine

## 2012-07-09 ENCOUNTER — Encounter: Payer: Self-pay | Admitting: Internal Medicine

## 2012-07-09 ENCOUNTER — Other Ambulatory Visit: Payer: Self-pay | Admitting: Internal Medicine

## 2012-07-09 ENCOUNTER — Ambulatory Visit (INDEPENDENT_AMBULATORY_CARE_PROVIDER_SITE_OTHER): Payer: Medicare Other | Admitting: Internal Medicine

## 2012-07-09 ENCOUNTER — Other Ambulatory Visit (HOSPITAL_BASED_OUTPATIENT_CLINIC_OR_DEPARTMENT_OTHER): Payer: Self-pay | Admitting: General Surgery

## 2012-07-09 ENCOUNTER — Ambulatory Visit (HOSPITAL_COMMUNITY)
Admission: RE | Admit: 2012-07-09 | Discharge: 2012-07-09 | Disposition: A | Discharge status: Home / Self Care | Payer: Medicare Other | Source: Ambulatory Visit | Attending: General Surgery | Admitting: General Surgery

## 2012-07-09 VITALS — BP 115/72 | HR 71 | Temp 97.5°F | Ht 65.0 in | Wt 272.1 lb

## 2012-07-09 DIAGNOSIS — Z0000 Encounter for general adult medical examination without abnormal findings: Secondary | ICD-10-CM

## 2012-07-09 DIAGNOSIS — J449 Chronic obstructive pulmonary disease, unspecified: Secondary | ICD-10-CM

## 2012-07-09 DIAGNOSIS — Z1231 Encounter for screening mammogram for malignant neoplasm of breast: Secondary | ICD-10-CM

## 2012-07-09 DIAGNOSIS — T8189XA Other complications of procedures, not elsewhere classified, initial encounter: Secondary | ICD-10-CM

## 2012-07-09 DIAGNOSIS — IMO0002 Reserved for concepts with insufficient information to code with codable children: Secondary | ICD-10-CM

## 2012-07-09 DIAGNOSIS — I10 Essential (primary) hypertension: Secondary | ICD-10-CM

## 2012-07-09 DIAGNOSIS — M79676 Pain in unspecified toe(s): Secondary | ICD-10-CM

## 2012-07-09 DIAGNOSIS — L299 Pruritus, unspecified: Secondary | ICD-10-CM

## 2012-07-09 DIAGNOSIS — M949 Disorder of cartilage, unspecified: Secondary | ICD-10-CM | POA: Insufficient documentation

## 2012-07-09 DIAGNOSIS — M7989 Other specified soft tissue disorders: Secondary | ICD-10-CM

## 2012-07-09 DIAGNOSIS — F32A Depression, unspecified: Secondary | ICD-10-CM

## 2012-07-09 DIAGNOSIS — E785 Hyperlipidemia, unspecified: Secondary | ICD-10-CM

## 2012-07-09 DIAGNOSIS — M792 Neuralgia and neuritis, unspecified: Secondary | ICD-10-CM

## 2012-07-09 DIAGNOSIS — F329 Major depressive disorder, single episode, unspecified: Secondary | ICD-10-CM

## 2012-07-09 DIAGNOSIS — S91109A Unspecified open wound of unspecified toe(s) without damage to nail, initial encounter: Secondary | ICD-10-CM | POA: Insufficient documentation

## 2012-07-09 DIAGNOSIS — J4489 Other specified chronic obstructive pulmonary disease: Secondary | ICD-10-CM

## 2012-07-09 DIAGNOSIS — X58XXXA Exposure to other specified factors, initial encounter: Secondary | ICD-10-CM | POA: Insufficient documentation

## 2012-07-09 DIAGNOSIS — E1165 Type 2 diabetes mellitus with hyperglycemia: Secondary | ICD-10-CM

## 2012-07-09 DIAGNOSIS — H9319 Tinnitus, unspecified ear: Secondary | ICD-10-CM

## 2012-07-09 DIAGNOSIS — E1169 Type 2 diabetes mellitus with other specified complication: Secondary | ICD-10-CM

## 2012-07-09 DIAGNOSIS — J069 Acute upper respiratory infection, unspecified: Secondary | ICD-10-CM

## 2012-07-09 DIAGNOSIS — I739 Peripheral vascular disease, unspecified: Secondary | ICD-10-CM

## 2012-07-09 DIAGNOSIS — M899 Disorder of bone, unspecified: Secondary | ICD-10-CM | POA: Insufficient documentation

## 2012-07-09 LAB — LIPID PANEL
Cholesterol: 189 mg/dL (ref 0–200)
HDL: 31 mg/dL — ABNORMAL LOW (ref >39)
LDL Cholesterol: 109 mg/dL — ABNORMAL HIGH (ref 0–99)
Total CHOL/HDL Ratio: 6.1 Ratio
Triglycerides: 247 mg/dL — ABNORMAL HIGH (ref <150)
VLDL: 49 mg/dL — ABNORMAL HIGH (ref 0–40)

## 2012-07-09 LAB — HEPATIC FUNCTION PANEL
ALT: 30 U/L (ref 0–35)
AST: 39 U/L — ABNORMAL HIGH (ref 0–37)
Albumin: 4 g/dL (ref 3.5–5.2)
Alkaline Phosphatase: 81 U/L (ref 39–117)
Bilirubin, Direct: 0.1 mg/dL (ref 0.0–0.3)
Indirect Bilirubin: 0.2 mg/dL (ref 0.0–0.9)
Total Bilirubin: 0.3 mg/dL (ref 0.3–1.2)
Total Protein: 6.5 g/dL (ref 6.0–8.3)

## 2012-07-09 LAB — GLUCOSE, CAPILLARY: Glucose-Capillary: 261 mg/dL — ABNORMAL HIGH (ref 70–99)

## 2012-07-09 MED ORDER — BENAZEPRIL HCL 40 MG PO TABS: 40.0000 mg | ORAL_TABLET | Freq: Every day | ORAL | Status: DC

## 2012-07-09 MED ORDER — PREGABALIN 100 MG PO CAPS: 200.0000 mg | ORAL_CAPSULE | Freq: Three times a day (TID) | ORAL | Status: DC

## 2012-07-09 MED ORDER — AMITRIPTYLINE HCL 25 MG PO TABS: 25.0000 mg | ORAL_TABLET | Freq: Every day | ORAL | Status: DC

## 2012-07-09 MED ORDER — ALBUTEROL SULFATE HFA 108 (90 BASE) MCG/ACT IN AERS: 2.0000 | INHALATION_SPRAY | Freq: Four times a day (QID) | RESPIRATORY_TRACT | Status: DC | PRN

## 2012-07-09 MED ORDER — FLUTICASONE-SALMETEROL 250-50 MCG/DOSE IN AEPB: 1.0000 | INHALATION_SPRAY | Freq: Two times a day (BID) | RESPIRATORY_TRACT | Status: DC

## 2012-07-09 MED ORDER — FLUTICASONE PROPIONATE 50 MCG/ACT NA SUSP: 1.0000 | Freq: Two times a day (BID) | NASAL | Status: DC

## 2012-07-09 NOTE — Patient Instructions (Signed)
Please start you new medications as instructed  Decrease salt intake Try Cerave, Cetaphil or Sarna lotions (Sarna is for itching, others good moisturizers) Try using dove soap and milder detergents Make sure you follow up with the eye doctor

## 2012-07-09 NOTE — Assessment & Plan Note (Addendum)
Uncontrolled with current tx last HA1C 8.3%  FSBS today 261 Complications severe peripheral neuropathy no sensation feet and lower legs, skin breakdown on b/l feet, DM neuropathic pain  Plan Cont. Current regimen Disc better diet choices Advised pt to bring meter in at next visit  Pt encouraged to f/u with eye MD Pt is following with wound clinic Jennifer [name]) q Friday for foot wounds Ordered urine microalbumin

## 2012-07-09 NOTE — Assessment & Plan Note (Addendum)
Lipid panel abnl results  LDL not at goal of <100 Pt needs a statin at f/u Consider adding but previous use of statin pt reports elevated LFTS

## 2012-07-09 NOTE — Progress Notes (Signed)
Subjective:    [name], female    DOB: 19-Jan-1961, 51 y.o.   MRN: [id number]  HPI Comments: 51 y.o female with multiple medical problems presents for f/u and review results of her echo 05/2012.    DM:  Pt states compliance with medication. Peripheral neuropathy worsening at times taking total of 400 mg Lyrica.  Pt has ulcer on the right 4 th toe and area on the left foot. She is following with the Wound Clinic qFriday until improvement. She had a wound clinic today where they removed some skin to the left foot wound area.  She did not bring her meter today  HTN:  Pt states her bp was low this am at the wound clinic and she felt lightheaded and dizzy. She does not remember the reading  Itching: Pt c/o itching for years. Wants to know if her liver is okay. She uses [employer] and cheap laundry detergent  Right hip and Left hip: Pt c/o burning sensation x 1-2 weeks. She states she previously had the shingles in those areas but was not treated for her shingles approx 2 years ago.    Leg swelling:  B/l and variable, worsening for a few weeks.    PVD: Pt c/o pain with walking and notes she has stents in her legs. She has not been seeing Vascular d/t cost.  Rx refills needed: Flonase, Advair, Albuterol  SH:  Pt lives with BF (dating since age 39 y.o on and off) who has stage 4 colon cancer. She is taking care of him. She has financial issues. She is on disability and cant afford to see specialists she needs to see.  Pt has 2 kids   Diabetes She presents for her follow-up diabetic visit. She has type 2 diabetes mellitus. Her disease course has been improving (HA1C improving but complications (i.e neuropathy worsening)). Pertinent negatives for hypoglycemia include no dizziness. (~ 3 weeks ago episode of hypoglycemia in the evening fsbs was 66 pt felt dizzy) Associated symptoms include fatigue, foot ulcerations and polyuria. Pertinent negatives for diabetes include no  chest pain and no weakness. Diabetic complications include peripheral neuropathy and PVD. (Unknown if pt has retinopathy) Risk factors for coronary artery disease include diabetes mellitus, hypertension, obesity and sedentary lifestyle. Current diabetic treatment includes insulin injections. She is compliant with treatment most of the time. Her weight is increasing steadily. An ACE inhibitor/angiotensin II receptor blocker is being taken. Sees podiatrist: sees [facility] long wound clinic Eye exam is current.      Review of Systems  Constitutional: Positive for fatigue and unexpected weight change.       +wt gain  Eyes:       +left eye protrudes and causes visual changes  Respiratory: Negative for shortness of breath.   Cardiovascular: Negative for chest pain.  Gastrointestinal:       +lower ab pain intermittent, not present currently  Genitourinary: Positive for polyuria. Negative for hematuria.       Denies rectal bleeding  Musculoskeletal: Positive for back pain and arthralgias.       +b/l leg swelling +lower leg pain with exertion/walking (h/o b/l leg stents)  Neurological: Negative for dizziness, weakness and light-headedness.  Psychiatric/Behavioral:       Denies depression       Objective:   Physical Exam  Nursing note and vitals reviewed. Constitutional: She is oriented to person, place, and time. Vital signs are normal. She appears well-developed and well-nourished. She is cooperative.  No distress.       Pleasant  Eating potatoe chips as I walked into the room  HENT:  Head: Normocephalic and atraumatic.  Mouth/Throat: Oropharynx is clear and moist and mucous membranes are normal. No oropharyngeal exudate.  Eyes: Conjunctivae are normal. Pupils are equal, round, and reactive to light. No scleral icterus.    Cardiovascular: Normal rate, regular rhythm, S1 normal, S2 normal and normal heart sounds.   No murmur heard. Pulses:      Dorsalis pedis pulses are 1+ on the right  side, and 1+ on the left side.       Posterior tibial pulses are 1+ on the right side, and 1+ on the left side.  Pulmonary/Chest: Effort normal. She has wheezes.  Abdominal: Soft. Bowel sounds are normal. She exhibits no distension. There is tenderness in the right lower quadrant and left upper quadrant.       Obese ab  Neurological: She is alert and oriented to person, place, and time.  Skin: Skin is warm and dry. She is not diaphoretic.          B/l lower ext with excoriations, healing areas of previous trauma  2+edema lower ext b/l  sole right foot 4 th toe with area of breakdown Callus to medial sole right foot  Left medial foot with bandage over wound. Blood on the bandage. Pt being followed by wound clinic qFriday  Psychiatric: She has a normal mood and affect. Her speech is normal and behavior is normal. Cognition and memory are normal.       Good historian  Pt very patient           Assessment & Plan:  Follow up within 1-3 months

## 2012-07-09 NOTE — Assessment & Plan Note (Addendum)
On anti-hypertensives BP controlled today 115/72 Pt reported she was dizzy, lightheaded at the wound clinic this am with a low BP  Plan D/c combo amlodipine-benazapril-d/c amlopdipine to see if leg swelling will improve Pt to continue Benazapril 40 mg qd Encouraged low salt diet Pt to bring bp readings into next visit

## 2012-07-09 NOTE — Assessment & Plan Note (Addendum)
Pt using harsh soaps and cheap laundry detergents Advised to use Dove soap  Can try Cetaphil, Cerave, or Sarna lotions for itching Sarna is for itching Last LFTs wnl Will draw lfts panel

## 2012-07-10 ENCOUNTER — Encounter: Payer: Self-pay | Admitting: Internal Medicine

## 2012-07-10 DIAGNOSIS — Z0000 Encounter for general adult medical examination without abnormal findings: Secondary | ICD-10-CM | POA: Insufficient documentation

## 2012-07-10 LAB — CREATININE, URINE, RANDOM: Creatinine, Urine: 56.8 mg/dL

## 2012-07-10 LAB — MICROALBUMIN, URINE: Microalb, Ur: 0.5 mg/dL (ref 0.00–1.89)

## 2012-07-10 LAB — MICROALBUMIN / CREATININE URINE RATIO: Microalb Creat Ratio: 8.8 mg/g (ref 0.0–30.0)

## 2012-07-10 NOTE — Assessment & Plan Note (Signed)
Previous w/u included echo 05/2012 Reviewed echo results with pt which was wnl EF 60-65% w/o systolic dysfunction    Plan Advised to decreased salt intake (eating a bag of lays potatoe chips today at office visit) Dc Norvasc component of combo pill

## 2012-07-10 NOTE — Assessment & Plan Note (Signed)
Pt was seeing Dr. Ezzard Standing but has not been able to follow with ENT due to financial reasons

## 2012-07-10 NOTE — Assessment & Plan Note (Signed)
S/p stent placement Pt still experiencing sxs of claudication She has not followed with vascular Advised pt to f/u with vascular but she states finances make it hard for her to see specialists

## 2012-07-10 NOTE — Assessment & Plan Note (Signed)
Refilled Albuteral and Advair

## 2012-07-10 NOTE — Assessment & Plan Note (Signed)
Foot exam performed with severe peripheral neuropathy b/l feet extending to lower legs b/l Pt admits to taking Lyrica up to qid and not helping  Plan Increased Lyrica 200 mg tid  Added Amitriptyline 25 qhs which can be titrated up in the future if pt still sx'matic

## 2012-07-10 NOTE — Assessment & Plan Note (Signed)
Last pap smear 1997, pt has had hysterectomy Pt follow with [facility]-advised to cont reg check ups Last mammogram 07/2010 neg, Made referral for mammogram Pt following with wound clinic q Friday for DM foot wounds currently  Last colonoscopy 03/2011 with diverticula, mild diverticulosis, polyps

## 2012-07-12 ENCOUNTER — Encounter: Payer: Self-pay | Admitting: Internal Medicine

## 2012-07-12 NOTE — Progress Notes (Signed)
agree

## 2012-07-12 NOTE — Addendum Note (Signed)
Addended by: Remus Blake on: 07/12/2012 12:21 PM   Modules accepted: Orders

## 2012-07-13 ENCOUNTER — Encounter: Payer: Medicare Other | Admitting: Internal Medicine

## 2012-07-14 MED ORDER — PREGABALIN 100 MG PO CAPS: 200.0000 mg | ORAL_CAPSULE | Freq: Three times a day (TID) | ORAL | Status: DC

## 2012-07-14 NOTE — Addendum Note (Signed)
Addended by: Annett Gula on: 07/14/2012 02:03 PM   Modules accepted: Orders, Medications

## 2012-07-14 NOTE — Addendum Note (Signed)
Addended by: Annett Gula on: 07/14/2012 02:08 PM   Modules accepted: Orders, Medications

## 2012-07-16 ENCOUNTER — Encounter: Payer: Medicare Other | Admitting: Internal Medicine

## 2012-07-20 ENCOUNTER — Encounter: Payer: Medicare Other | Admitting: Internal Medicine

## 2012-07-21 ENCOUNTER — Encounter: Payer: Self-pay | Admitting: Internal Medicine

## 2012-07-21 ENCOUNTER — Ambulatory Visit (INDEPENDENT_AMBULATORY_CARE_PROVIDER_SITE_OTHER): Payer: Medicare Other | Admitting: Internal Medicine

## 2012-07-21 VITALS — BP 132/85 | HR 66 | Temp 97.1°F | Ht 67.5 in | Wt 269.1 lb

## 2012-07-21 DIAGNOSIS — IMO0002 Reserved for concepts with insufficient information to code with codable children: Secondary | ICD-10-CM

## 2012-07-21 DIAGNOSIS — E1165 Type 2 diabetes mellitus with hyperglycemia: Secondary | ICD-10-CM

## 2012-07-21 DIAGNOSIS — E785 Hyperlipidemia, unspecified: Secondary | ICD-10-CM

## 2012-07-21 DIAGNOSIS — G8929 Other chronic pain: Secondary | ICD-10-CM

## 2012-07-21 DIAGNOSIS — G894 Chronic pain syndrome: Secondary | ICD-10-CM

## 2012-07-21 DIAGNOSIS — R682 Dry mouth, unspecified: Secondary | ICD-10-CM | POA: Insufficient documentation

## 2012-07-21 DIAGNOSIS — I10 Essential (primary) hypertension: Secondary | ICD-10-CM

## 2012-07-21 DIAGNOSIS — K117 Disturbances of salivary secretion: Secondary | ICD-10-CM

## 2012-07-21 DIAGNOSIS — E119 Type 2 diabetes mellitus without complications: Secondary | ICD-10-CM

## 2012-07-21 DIAGNOSIS — Z9889 Other specified postprocedural states: Secondary | ICD-10-CM

## 2012-07-21 DIAGNOSIS — I739 Peripheral vascular disease, unspecified: Secondary | ICD-10-CM

## 2012-07-21 DIAGNOSIS — Z0000 Encounter for general adult medical examination without abnormal findings: Secondary | ICD-10-CM

## 2012-07-21 DIAGNOSIS — E1169 Type 2 diabetes mellitus with other specified complication: Secondary | ICD-10-CM

## 2012-07-21 DIAGNOSIS — R251 Tremor, unspecified: Secondary | ICD-10-CM

## 2012-07-21 DIAGNOSIS — M7989 Other specified soft tissue disorders: Secondary | ICD-10-CM

## 2012-07-21 DIAGNOSIS — M792 Neuralgia and neuritis, unspecified: Secondary | ICD-10-CM

## 2012-07-21 DIAGNOSIS — G253 Myoclonus: Secondary | ICD-10-CM

## 2012-07-21 LAB — GLUCOSE, CAPILLARY: Glucose-Capillary: 137 mg/dL — ABNORMAL HIGH (ref 70–99)

## 2012-07-21 MED ORDER — OXYCODONE-ACETAMINOPHEN 7.5-325 MG PO TABS: 1.0000 | ORAL_TABLET | Freq: Four times a day (QID) | ORAL | Status: DC | PRN

## 2012-07-21 NOTE — Patient Instructions (Signed)
Follow up 2-3 months  Chronic Pain Chronic pain can be defined as pain that is lasting, off and on, and lasts for 3 to 6 months or longer. Many things cause chronic pain, which can make it difficult to make a discrete diagnosis. There are many treatment options available for chronic pain. However, finding a treatment that works well for you may require trying various approaches until a suitable one is found. CAUSES  In some types of chronic medical conditions, the pain is caused by a normal pain response within the body. A normal pain response helps the body identify illness or injury and prevent further damage from being done. In these cases, the cause of the pain may be identified and treated, even if it may not be cured completely. Examples of chronic conditions which can cause chronic pain include:  Inflammation of the joints (arthritis).   Back pain or neck pain (including bulging or herniated disks).   Migraine headaches.   Cancer.  In some other types of chronic pain syndromes, the pain is caused by an abnormal pain response within the body. An abnormal pain response is present when there is no ongoing cause (or stimulus) for the pain, or when the cause of the pain is arising from the nerves or nervous system itself. Examples of conditions which can cause chronic pain due to an abnormal pain response include:  Fibromyalgia.   Reflex sympathetic dystrophy (RSD).   Neuropathy (when the nerves themselves are damaged, and may cause pain).  DIAGNOSIS  Your caregiver will help diagnose your condition over time. In many cases, the initial focus will be on excluding conditions that could be causing the pain. Depending on your symptoms, your caregiver may order some tests to diagnose your condition. Some of these tests include:  Blood tests.   Computerized X-ray scans (CT scan).   Computerized magnetic scans (MRI).   X-rays.   Ultrasounds.   Nerve conduction studies.   Consultation  with other physicians or specialists.  TREATMENT  There are many treatment options for people suffering from chronic pain. Finding a treatment that works well may take time.   You may be referred to a pain management specialist.   You may be put on medication to help with the pain. Unfortunately, some medications (such as opiate medications) may not be very effective in cases where chronic pain is due to abnormal pain responses. Finding the right medications can take some time.   Adjunctive therapies may be used to provide additional relief and improve a patient's quality of life. These therapies include:   Mindfulness meditation.   Acupuncture.   Biofeedback.   Cognitive-behavioral therapy.   In certain cases, surgical interventions may be attempted.  HOME CARE INSTRUCTIONS   Make sure you understand these instructions prior to discharge.   Ask any questions and share any further concerns you have with your caregiver prior to discharge.   Take all medications as directed by your caregiver.   Keep all follow-up appointments.  SEEK MEDICAL CARE IF:   Your pain gets worse.   You develop a new pain that was not present before.   You cannot tolerate any medications prescribed by your caregiver.   You develop new symptoms since your last visit with your caregiver.  SEEK IMMEDIATE MEDICAL CARE IF:   You develop muscular weakness.   You have decreased sensation or numbness.   You lose control of bowel or bladder function.   Your pain suddenly gets much worse.  You have an oral temperature above 102 F (38.9 C), not controlled by medication.   You develop shaking chills, confusion, chest pain, or shortness of breath.  Document Released: 08/30/2002 Document Revised: 11/27/2011 Document Reviewed: 12/06/2008 [facility] Patient Information 2012 [place], [place].

## 2012-07-22 ENCOUNTER — Other Ambulatory Visit: Payer: Self-pay | Admitting: Internal Medicine

## 2012-07-22 ENCOUNTER — Encounter: Payer: Self-pay | Admitting: Vascular Surgery

## 2012-07-22 DIAGNOSIS — F329 Major depressive disorder, single episode, unspecified: Secondary | ICD-10-CM

## 2012-07-22 DIAGNOSIS — F32A Depression, unspecified: Secondary | ICD-10-CM

## 2012-07-22 LAB — PRESCRIPTION ABUSE MONITORING 15P, URINE
Amphetamine/Meth: NEGATIVE ng/mL
Barbiturate Screen, Urine: NEGATIVE ng/mL
Benzodiazepine Screen, Urine: NEGATIVE ng/mL
Buprenorphine, Urine: NEGATIVE ng/mL
Cannabinoid Scrn, Ur: NEGATIVE ng/mL
Carisoprodol, Urine: NEGATIVE ng/mL
Cocaine Metabolites: NEGATIVE ng/mL
Creatinine, Urine: 216.09 mg/dL (ref >20.0)
Fentanyl, Ur: NEGATIVE ng/mL
Meperidine, Ur: NEGATIVE ng/mL
Methadone Screen, Urine: NEGATIVE ng/mL
Propoxyphene: NEGATIVE ng/mL
Tramadol Scrn, Ur: NEGATIVE ng/mL
Zolpidem, Urine: NEGATIVE ng/mL

## 2012-07-23 ENCOUNTER — Encounter (HOSPITAL_BASED_OUTPATIENT_CLINIC_OR_DEPARTMENT_OTHER): Payer: Medicare Other | Attending: General Surgery

## 2012-07-23 DIAGNOSIS — L97809 Non-pressure chronic ulcer of other part of unspecified lower leg with unspecified severity: Secondary | ICD-10-CM | POA: Insufficient documentation

## 2012-07-23 DIAGNOSIS — L97509 Non-pressure chronic ulcer of other part of unspecified foot with unspecified severity: Secondary | ICD-10-CM | POA: Insufficient documentation

## 2012-07-23 DIAGNOSIS — I872 Venous insufficiency (chronic) (peripheral): Secondary | ICD-10-CM | POA: Insufficient documentation

## 2012-07-23 DIAGNOSIS — E1169 Type 2 diabetes mellitus with other specified complication: Secondary | ICD-10-CM | POA: Insufficient documentation

## 2012-07-25 ENCOUNTER — Encounter: Payer: Self-pay | Admitting: Internal Medicine

## 2012-07-25 LAB — OPIATES/OPIOIDS (LC/MS-MS)
Codeine Urine: NEGATIVE ng/mL
Heroin (6-AM), UR: NEGATIVE ng/mL
Hydrocodone: NEGATIVE ng/mL
Hydromorphone: NEGATIVE ng/mL
Morphine Urine: NEGATIVE ng/mL
Oxycodone, ur: 1174 ng/mL
Oxymorphone: 670 ng/mL

## 2012-07-25 NOTE — Assessment & Plan Note (Addendum)
Involuntary movements noted at rest, intermittently Previously noted in 04/2012 Etiology likely 2/2 medications i.e Lyrica, Elavil, Baclofen Pt does not want to stop medications as of now Rec if this continues seek medical attn from Neurology

## 2012-07-25 NOTE — Assessment & Plan Note (Addendum)
Chronic, no sig change in edema or pain (even though RLE more swollen than LLE) Pt always noticed asymmetry between two lower legs Pt wearing compression stockings to help Pt doesn't notice a difference since stopping Norvasc Pt needs to f/u Vascular as well as she has b/l lower leg stents for h/o PVD

## 2012-07-25 NOTE — Assessment & Plan Note (Addendum)
-  uncontrolled (diet compliance an issue d/t cost) -pt states some days she is only eating 1x daily due to not being able to afford food (given food today from the pantry) -05/2012 HA1C 8.3% -pt brought meter today fsbs avg 277 with highs at dinner 300-400 -pt has h/o hypoglycemic episode 66 (felt dizzy, shaky, weak) -pt taking at times 120 units 75/25 bid when Rx to take 120 u q am and 65 u q pm -Advised pt to only take as instructed d/t h/u hypoglycemic episodes fsbs 66.  -Advised pt to meet with [name] for diabetic ed. I.e diabetic diet  -DM associated with leg/foot wounds (right shin, left medial foot)-pt is following with Wound clinic q Friday

## 2012-07-25 NOTE — Assessment & Plan Note (Signed)
Pt scheduled for mammogram 07/2012 Pt will schedule eye MD follow up

## 2012-07-25 NOTE — Assessment & Plan Note (Addendum)
Likely 2/2 degenerative changes left shoulder, back MRI 2008 back showed severe deg arthritis L4-L5, minimal changes L5-S1 Rx refilled Percocet 7.5-325 mg #120 no refills-pt not to fill until 07/27/12 UDS-panel 15 obtained.

## 2012-07-25 NOTE — Assessment & Plan Note (Signed)
Biopsy left cheek mucosa by dentist Dr. Manson Passey ([address]) Pt reports discomfort in this area Obvious post bx sites, no evidence of ulceration, infection She does have a h/o smoking Will try to obtain records from Dr. Manson Passey to see exactly what was biopsied.

## 2012-07-25 NOTE — Assessment & Plan Note (Signed)
BP 132/85, controlled today  Cont Atenolol 100 mg qd, Benazapril 40 mg qd, HCTZ 25 mg qd

## 2012-07-25 NOTE — Assessment & Plan Note (Addendum)
Distribution b/l hands and feet Cont. Amitryptiline 25 mg qhs and Lyrica 200 mg bid Pt noticing improvement

## 2012-07-25 NOTE — Assessment & Plan Note (Signed)
Pt needs to f/u with Vascular surgery d/t chronic leg edema with h/o stents Cont Plavix 75 mg qd

## 2012-07-25 NOTE — Progress Notes (Signed)
Subjective:    [name], female    DOB: 1961-10-16, 51 y.o.   MRN: [id number]  HPI Comments: Patient was seen and evaluated 07/21/12.    51 y.o woman significant PMH presents for  1)Rx refill Percocet under pain contract (wants Percocet this visit even though not due until 07/27/12 d/t problems with transoporation  2) Pt c/o shaking after medication change last visit (Increased Lyrica 200 mg tid and added Amitriptyline 25 mg qhs).  Shaking has been worse for the last couple of weeks.  Patient notes shaking so badly multiple times a day that she has broken dishes and dropped food on the floor.  She has DM neuropathic pain.  She reports her pain, numbness and tingling are improved with the medication change at last visit and does not want to change her medications.  She denies seeing Neurology in the past.  She has had similar shaking episodes noted in May 2013 visit.  During these episodes she notes her hands and legs are jerky while still or lying down.  The jerks are intermittently and random.    3) Pt reports mouth dryness and lip burning.  She has tried Biotene gum and drinking water w/o relief.  4) Pt reports chronic leg swelling b/l.  She reports dragging her right lower leg d/t swelling.  She has chronic leg pain w/o change in pain recently.    5) Patient reports discomfort in her mouth on the inside of her left cheek.  She reports she is a smoker.  She previously had biopsies inside her mouth for unknown reason (per hx) by Dr. Manson Passey on [address].    6) DM-pt reports hypoglycemic episodes with fsbs as low as 66 at home where she felt shaky, weak, dizzy.  She tried candy with relief.   At times patient has been taking 75/25 Humalog (120 units bid) when she is schedule to take 120 qam and 65 units qhs.    7) DM foot wounds lower ext (right lower leg, left medial foot). -She follows with the wound clinic q Friday  SH: She reports getting $940.00 of social security/month  and $50.00 in food stamps.  She is on section 8.  At times she has trouble affording healthy food, food in general, and her medications.  Patient desires to start water aerobics.          Review of Systems  HENT:       +dry mouth +discomfort in mouth inside left cheek.    Respiratory: Negative for shortness of breath.   Cardiovascular: Positive for leg swelling. Negative for chest pain.  Gastrointestinal:       Denies ab pain  Musculoskeletal: Positive for back pain.       +chronic lower ext edema R>L. No difference or noted pain > normal  Neurological:       +shaking upper and lower ext       Objective:   Physical Exam  Nursing note and vitals reviewed. Constitutional: She is oriented to person, place, and time. Vital signs are normal. She appears well-developed and well-nourished. She is cooperative. No distress.  HENT:  Head: Normocephalic and atraumatic.  Mouth/Throat: Mucous membranes are dry.       Dry appearance to tongue  Left inside of cheek-prior surgical scars. No evidence of bleeding, ulceration or thrush  Eyes: Conjunctivae are normal. No scleral icterus.  Cardiovascular: Normal rate, regular rhythm, S1 normal, S2 normal and normal heart sounds.  No murmur heard.      Lower ext edema 2+ right lower ext >left lower ext Pt denies pain greater than normal chronic lower ext pain  Pulmonary/Chest: Effort normal and breath sounds normal. No respiratory distress. She has no wheezes.  Abdominal: Soft. Bowel sounds are normal.       Mild TTP LLQ  Obese ab  Neurological: She is alert and oriented to person, place, and time. Gait abnormal.       Baseline walks with cane  Skin: Skin is warm and dry. She is not diaphoretic.          +excoriations to right shin  +left medial foot with diabetic foot wound with bandage   Psychiatric: She has a normal mood and affect. Her speech is normal.          Assessment & Plan:  F/u 2-3 months with PCP

## 2012-07-25 NOTE — Assessment & Plan Note (Signed)
Likely side effect of Amitryptiline Advised pt to drink more water and side effect will eventually hopefully subside

## 2012-07-27 NOTE — Progress Notes (Signed)
agree

## 2012-07-29 ENCOUNTER — Ambulatory Visit (HOSPITAL_COMMUNITY): Admission: RE | Admit: 2012-07-29 | Payer: Medicare Other | Source: Ambulatory Visit

## 2012-07-29 ENCOUNTER — Other Ambulatory Visit: Payer: Self-pay | Admitting: *Deleted

## 2012-07-29 DIAGNOSIS — IMO0002 Reserved for concepts with insufficient information to code with codable children: Secondary | ICD-10-CM

## 2012-07-29 DIAGNOSIS — E1165 Type 2 diabetes mellitus with hyperglycemia: Secondary | ICD-10-CM

## 2012-07-29 MED ORDER — ONETOUCH DELICA LANCETS MISC: Status: DC

## 2012-07-29 MED ORDER — GLUCOSE BLOOD VI STRP: ORAL_STRIP | Status: DC

## 2012-07-29 NOTE — Telephone Encounter (Signed)
Now using mail order pharmacy

## 2012-07-30 ENCOUNTER — Other Ambulatory Visit: Payer: Self-pay | Admitting: Internal Medicine

## 2012-07-30 ENCOUNTER — Encounter (HOSPITAL_BASED_OUTPATIENT_CLINIC_OR_DEPARTMENT_OTHER): Payer: Medicare Other

## 2012-08-02 ENCOUNTER — Other Ambulatory Visit: Payer: Self-pay | Admitting: Internal Medicine

## 2012-08-19 ENCOUNTER — Other Ambulatory Visit: Payer: Self-pay | Admitting: *Deleted

## 2012-08-19 MED ORDER — "PEN NEEDLES 5/16"" 31G X 8 MM MISC": 60.0000 [IU] | Freq: Three times a day (TID) | Status: DC | PRN

## 2012-08-22 ENCOUNTER — Other Ambulatory Visit: Payer: Self-pay | Admitting: Internal Medicine

## 2012-08-24 ENCOUNTER — Encounter: Payer: Self-pay | Admitting: Internal Medicine

## 2012-08-24 ENCOUNTER — Ambulatory Visit (INDEPENDENT_AMBULATORY_CARE_PROVIDER_SITE_OTHER): Payer: Medicare Other | Admitting: Internal Medicine

## 2012-08-24 VITALS — BP 128/76 | HR 58 | Temp 97.2°F | Ht 67.0 in | Wt 277.1 lb

## 2012-08-24 DIAGNOSIS — G894 Chronic pain syndrome: Secondary | ICD-10-CM

## 2012-08-24 DIAGNOSIS — Z1239 Encounter for other screening for malignant neoplasm of breast: Secondary | ICD-10-CM

## 2012-08-24 DIAGNOSIS — M7500 Adhesive capsulitis of unspecified shoulder: Secondary | ICD-10-CM

## 2012-08-24 DIAGNOSIS — Z79899 Other long term (current) drug therapy: Secondary | ICD-10-CM

## 2012-08-24 DIAGNOSIS — E1165 Type 2 diabetes mellitus with hyperglycemia: Secondary | ICD-10-CM

## 2012-08-24 DIAGNOSIS — IMO0002 Reserved for concepts with insufficient information to code with codable children: Secondary | ICD-10-CM

## 2012-08-24 DIAGNOSIS — I10 Essential (primary) hypertension: Secondary | ICD-10-CM

## 2012-08-24 DIAGNOSIS — M7502 Adhesive capsulitis of left shoulder: Secondary | ICD-10-CM

## 2012-08-24 LAB — GLUCOSE, CAPILLARY: Glucose-Capillary: 131 mg/dL — ABNORMAL HIGH (ref 70–99)

## 2012-08-24 LAB — POCT GLYCOSYLATED HEMOGLOBIN (HGB A1C): Hemoglobin A1C: 8.3

## 2012-08-24 MED ORDER — OXYCODONE-ACETAMINOPHEN 7.5-325 MG PO TABS: 1.0000 | ORAL_TABLET | Freq: Four times a day (QID) | ORAL | Status: DC | PRN

## 2012-08-24 NOTE — Patient Instructions (Signed)
1.  For the next month keep a food diary.  - Get a notebook and write down everything you eat   - Write down your blood sugars as well  - Write down when you take you insulin.  2.  Continue taking your medications as prescribed  3.  We will make your appointment for your mammogram.   4.  I will fill out and fax the paper work to the [facility] transport.  5.  Follow up with me at the end of this month.

## 2012-08-24 NOTE — Progress Notes (Signed)
Subjective:   [name] female   DOB: [birth year] 51 y.o.   MRN: [id number]  HPI: Jennifer [name] is a 51 y.o. woman who presents for refill of her chronic pain medication as well as follow up of her chronic medical problems including diabetes and hypertension. She also has several health maintenance items to take care of including mammogram for breast cancer screening.   She states that she has been taking her medications for diabetes including her 70/30 insulin.  She takes 120 units in the morning and 65 units at bedtime.  She does not check her blood sugars regularly and did not bring her meter with her today.  She states that she only misses doses occasionally, less then once weekly.  She does not follow a specific diet and doesn't really keep track of what she is eating and how it correlates with her blood sugar.  She denies polyuria or blurry vision but states that she is thirsty often.    She brought her medications with her and states that she is taking them as prescribed.  She has several items listed on her med list twice including a combination tablet for amlodipine and benazepril.  She takes the benazepril but not the amlodipine.  Her medication list was updated to reflect what she is taking currently.   She has a form to fill out for [facility] today in the office.  She has trouble getting to appointments because of transportation and would benefit from their services.  She has several health maintenance items to take care of including mammogram screening, diabetic eye exam.  She has a history of multiple breast lumps and biopsies, all of which have been benign.  She is s/p hysterectomy for non malignant reasons and therefore is not a candidate for pap smears.   Past Medical History  Diagnosis Date  . Depression   . Diabetes mellitus   . GERD (gastroesophageal reflux disease)   . Hyperlipidemia   . Hypertension   . Chronic kidney disease   . Glaucoma   .  COPD (chronic obstructive pulmonary disease)   . Chronic shoulder pain   . Chronic back pain   . PVD (peripheral vascular disease) with claudication   . Tinnitus   . Peripheral neuropathy   . Fibromyalgia   . Colon polyp   . Diverticula of colon   . Diverticulosis    Current Outpatient Prescriptions  Medication Sig Dispense Refill  . albuterol (PROVENTIL HFA) 108 (90 BASE) MCG/ACT inhaler Inhale 2 puffs into the lungs every 6 (six) hours as needed for wheezing or shortness of breath.  3.7 g  5  . amitriptyline (ELAVIL) 25 MG tablet Take 1 tablet (25 mg total) by mouth at bedtime.  30 tablet  1  . amLODipine-benazepril (LOTREL) 10-40 MG per capsule TAKE 1 CAPSULE BY MOUTH DAILY.  30 capsule  4  . aspirin 81 MG tablet Take 81 mg by mouth daily.        Marland Kitchen atenolol (TENORMIN) 100 MG tablet TAKE 1 TABLET (100 MG TOTAL) BY MOUTH DAILY.  30 tablet  9  . baclofen (LIORESAL) 10 MG tablet TAKE 1 TABLET THREE TIMES A DAY  90 tablet  1  . benazepril (LOTENSIN) 40 MG tablet Take 1 tablet (40 mg total) by mouth daily.  30 tablet  3  . Blood Glucose Monitoring Suppl (ONE TOUCH ULTRA MINI) W/DEVICE KIT 1 each by Does not apply route 3 (three) times daily.  1 each  0  . Blood Glucose Monitoring Suppl (TRUETRACK SMART SYSTEM) KIT 1 each by Does not apply route 3 (three) times daily.  1 each  0  . clopidogrel (PLAVIX) 75 MG tablet Take 1 tablet (75 mg total) by mouth daily.  30 tablet  11  . escitalopram (LEXAPRO) 20 MG tablet Take 1 tablet (20 mg total) by mouth daily.  30 tablet  6  . fluticasone (FLONASE) 50 MCG/ACT nasal spray Place 1 spray into the nose 2 (two) times daily.  16 g  2  . Fluticasone-Salmeterol (ADVAIR DISKUS) 250-50 MCG/DOSE AEPB Inhale 1 puff into the lungs 2 (two) times daily.  1 each  5  . glucose blood (ONE TOUCH ULTRA TEST) test strip Use as instructed  100 each  12  . glucose blood (TRUETRACK TEST) test strip Use as instructed to test blood sugar 3 times per day.  Dx: ICD-9 250.82   100 each  2  . HUMALOG MIX 75/25 KWIKPEN (75-25) 100 UNIT/ML SUSP INJECT SUBCUTANEOUSLY 120 UNITS IN THE MORNING AND 65 UNITS AT NIGHT  60 mL  5  . hydrochlorothiazide (HYDRODIURIL) 25 MG tablet TAKE 1 TABLET (25 MG TOTAL) BY MOUTH DAILY.  90 tablet  1  . Insulin Pen Needle (PEN NEEDLES 31GX5/16") 31G X 8 MM MISC 60 Units by Does not apply route 3 (three) times daily with meals as needed.  60 each  3  . NEXIUM 20 MG capsule TAKE 1 CAPSULE (20 MG TOTAL) BY MOUTH DAILY.  30 capsule  4  . ONETOUCH DELICA LANCETS MISC Use as instructed to test blood sugar 3 times per day.  Dx: ICD-9 250.82  100 each  2  . oxyCODONE-acetaminophen (PERCOCET) 7.5-325 MG per tablet Take 1 tablet by mouth every 6 (six) hours as needed for pain.  120 tablet  0  . pregabalin (LYRICA) 100 MG capsule Take 2 capsules (200 mg total) by mouth 3 (three) times daily.  160 capsule  3  . traZODone (DESYREL) 100 MG tablet Take 2 tablets (200 mg total) by mouth at bedtime.  60 tablet  6   No family history on file. History   Social History  . Marital Status: Single    Spouse Name: N/A    Number of Children: N/A  . Years of Education: N/A   Social History Main Topics  . Smoking status: Current Everyday Smoker -- 0.5 packs/day for 40 years    Types: Cigarettes  . Smokeless tobacco: Never Used   Comment: stopped x 8 days  . Alcohol Use: No  . Drug Use: No  . Sexually Active: Not on file   Other Topics Concern  . Not on file   Social History Narrative  . No narrative on file   Review of Systems: Constitutional: Denies fever, chills, diaphoresis, appetite change and fatigue.  HEENT: Denies photophobia, eye pain, redness, hearing loss, ear pain, congestion, sore throat, rhinorrhea, sneezing, mouth sores, trouble swallowing, neck pain, neck stiffness and tinnitus.   Respiratory: Denies SOB, DOE, cough, chest tightness,  and wheezing.   Cardiovascular: Denies chest pain, palpitations and leg swelling.  Gastrointestinal:  Denies nausea, vomiting, abdominal pain, diarrhea, constipation, blood in stool and abdominal distention.  Genitourinary: Denies dysuria, urgency, frequency, hematuria, flank pain and difficulty urinating.  Musculoskeletal: Denies myalgias, back pain, joint swelling, arthralgias and gait problem.  Skin: Denies pallor, rash and wound.  Neurological: Denies dizziness, seizures, syncope, weakness, light-headedness, numbness and headaches.  Hematological: Denies adenopathy. Easy  bruising, personal or family bleeding history  Psychiatric/Behavioral: Denies suicidal ideation, mood changes, confusion, nervousness, sleep disturbance and agitation  Objective:  Physical Exam: Filed Vitals:   08/24/12 1536 08/24/12 1537  BP:  128/76  Pulse:  58  Temp:  97.2 F (36.2 C)  TempSrc: Oral Oral  Height: 5\' 7"  (1.702 m)   Weight:  277 lb 1.6 oz (125.692 kg)  SpO2:  95%   Constitutional: Vital signs reviewed.  Patient is a well-developed and well-nourished morbidly obese woman in no acute distress and cooperative with exam. Alert and oriented x3.  Head: Normocephalic and atraumatic Ear: TM normal bilaterally Mouth: no erythema or exudates, MMM Eyes: PERRL, EOMI, conjunctivae normal, No scleral icterus.  Neck: Supple, Trachea midline normal ROM, No JVD, mass, thyromegaly, or carotid bruit present.  Cardiovascular: RRR, S1 normal, S2 normal, no MRG, pulses symmetric and intact bilaterally Pulmonary/Chest: CTAB, no wheezes, rales, or rhonchi Abdominal: Soft. Non-tender, non-distended, bowel sounds are normal, no masses, organomegaly, or guarding present.  GU: no CVA tenderness Musculoskeletal: decreased ROM in the left shoulder and pain with most movements.  Several tender points on the neck and back as well.  No joint deformities, erythema, or stiffness, ROM full and no nontender Hematology: no cervical, inginal, or axillary adenopathy.  Neurological: A&O x3, Strength is normal and symmetric bilaterally,  cranial nerve II-XII are grossly intact, no focal motor deficit, sensory intact to light touch bilaterally.  Skin: Warm, dry and intact. No rash, cyanosis, or clubbing.  Psychiatric: Normal mood and flat affect. speech and behavior is normal. Judgment and thought content normal. Cognition and memory are normal.   Assessment & Plan:

## 2012-09-09 ENCOUNTER — Other Ambulatory Visit: Payer: Self-pay | Admitting: Internal Medicine

## 2012-09-20 ENCOUNTER — Ambulatory Visit: Payer: Medicare Other | Admitting: Internal Medicine

## 2012-09-22 ENCOUNTER — Other Ambulatory Visit: Payer: Self-pay | Admitting: *Deleted

## 2012-09-22 DIAGNOSIS — G894 Chronic pain syndrome: Secondary | ICD-10-CM

## 2012-09-22 NOTE — Telephone Encounter (Signed)
She can get the script at appointment if appropraite.thanks

## 2012-09-22 NOTE — Telephone Encounter (Signed)
Last filled 9/3 Pt has appointment tomorrow.

## 2012-09-23 ENCOUNTER — Encounter: Payer: Self-pay | Admitting: Internal Medicine

## 2012-09-23 ENCOUNTER — Ambulatory Visit (INDEPENDENT_AMBULATORY_CARE_PROVIDER_SITE_OTHER): Payer: Medicare Other | Admitting: Internal Medicine

## 2012-09-23 VITALS — BP 135/74 | HR 74 | Temp 97.0°F | Wt 282.2 lb

## 2012-09-23 DIAGNOSIS — G894 Chronic pain syndrome: Secondary | ICD-10-CM

## 2012-09-23 DIAGNOSIS — IMO0002 Reserved for concepts with insufficient information to code with codable children: Secondary | ICD-10-CM

## 2012-09-23 DIAGNOSIS — F329 Major depressive disorder, single episode, unspecified: Secondary | ICD-10-CM

## 2012-09-23 DIAGNOSIS — Z23 Encounter for immunization: Secondary | ICD-10-CM

## 2012-09-23 DIAGNOSIS — M5137 Other intervertebral disc degeneration, lumbosacral region: Secondary | ICD-10-CM

## 2012-09-23 DIAGNOSIS — F3289 Other specified depressive episodes: Secondary | ICD-10-CM

## 2012-09-23 DIAGNOSIS — Z0000 Encounter for general adult medical examination without abnormal findings: Secondary | ICD-10-CM

## 2012-09-23 DIAGNOSIS — IMO0001 Reserved for inherently not codable concepts without codable children: Secondary | ICD-10-CM

## 2012-09-23 DIAGNOSIS — E1165 Type 2 diabetes mellitus with hyperglycemia: Secondary | ICD-10-CM

## 2012-09-23 MED ORDER — OXYCODONE-ACETAMINOPHEN 7.5-325 MG PO TABS: 1.0000 | ORAL_TABLET | Freq: Four times a day (QID) | ORAL | Status: DC | PRN

## 2012-09-23 MED ORDER — AMITRIPTYLINE HCL 50 MG PO TABS: 50.0000 mg | ORAL_TABLET | Freq: Every day | ORAL | Status: DC

## 2012-09-23 NOTE — Patient Instructions (Addendum)
-  Please increase amitriptyline to 50mg  before bed and discontinue trazodone.  -Continue to try to work on your eating habits, and try to take your insulin twice a day everyday.  Be sure to bring your glucometer with you next time, so we know if to make adjustments to your regimen.  Please be sure to bring all of your medications with you to every visit.  Should you have any new or worsening symptoms, please be sure to call the clinic at [phone].

## 2012-09-23 NOTE — Assessment & Plan Note (Signed)
Increase amitriptyline to 50mg  qHS.  Discontinue trazodone.  Continue lexapro, but interaction between lexapro and amitriptyline is increased level of TCA and increased risk of SIADH. May consider checking BMET at follow up appointment.  If mood & sleep situation no better, then consider changing SSRI.

## 2012-09-23 NOTE — Assessment & Plan Note (Signed)
Flu shot today 

## 2012-09-23 NOTE — Progress Notes (Signed)
Subjective:   [name] female   DOB: 1961-10-14 51 y.o.   MRN: [id number]  HPI: [name] is a 51 y.o. woman with h/o DM, HTN, chronic pain syndrome, and adhesive capsulitis of left shoulder who was last seen on 08/24/12 presents today for follow up.  She was supposed to follow up with food diary and CBGs but has not brought diary or glucometer with her today.   Diabetes: She knows she eats the wrong food.  Doesn't like diet sugars/sodas.  Notes 30-40 lbs this summer.  Can't get out to exercise because of arthritis in spine, cannot stand longer than 10 minutes at atime.  Needs cane even inside the house. Inconsistent number of meals/ day (1-3).  Occasionally snacks.  Checks blood sugar approx 2x/day. This week she reports a low of 74, which was symptomatic (dizzy/woozy/not right). Responded by eating choc cookie & kool aid, then up to 130.  In the last month, the highest CBG she reports is in 400s.  First thing in the morning CBG varies depending on if she took insulin.  If she takes insulin 88/94/112.  If no insulin, then 291/300s.  Insulin 125 qAM and 65 q PM, but sometimes 100qPM Trying to cut down on fried foods and trying to bake meat.  Trying to cut down bread/starches.  Tries to eat more vegetables.  Tries to snack on fruit instead of chips.   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   Sleep: hard time falling asleep and staying asleep Guilt: Feels guilty about a lot, about how she raised children/ things negative in life Energy: poor Concentration: good Appetite: poor lately - some days only 1 meal/day Psychomotor agitation: feels like she snaps on people/decreased patience No SI/HI    Past Medical History  Diagnosis Date  . Depression   . Diabetes mellitus   . GERD (gastroesophageal reflux disease)   . Hyperlipidemia   . Hypertension   . Chronic kidney disease   . Glaucoma   .  COPD (chronic obstructive pulmonary disease)   . Chronic shoulder pain   . Chronic back pain   . PVD (peripheral vascular disease) with claudication   . Tinnitus   . Peripheral neuropathy   . Fibromyalgia   . Colon polyp   . Diverticula of colon   . Diverticulosis    Current Outpatient Prescriptions  Medication Sig Dispense Refill  . albuterol (PROVENTIL HFA) 108 (90 BASE) MCG/ACT inhaler Inhale 2 puffs into the lungs every 6 (six) hours as needed for wheezing or shortness of breath.  3.7 g  5  . amitriptyline (ELAVIL) 25 MG tablet TAKE 1 TABLET BY MOUTH AT BEDTIME  30 tablet  1  . aspirin 81 MG tablet Take 81 mg by mouth daily.        Marland Kitchen atenolol (TENORMIN) 100 MG tablet TAKE 1 TABLET (100 MG TOTAL) BY MOUTH DAILY.  30 tablet  9  . baclofen (LIORESAL) 10 MG tablet TAKE 1 TABLET THREE TIMES A DAY  90 tablet  1  . benazepril (LOTENSIN) 40 MG tablet Take 1 tablet (40 mg total) by mouth daily.  30 tablet  3  . Blood Glucose Monitoring Suppl (TRUETRACK SMART SYSTEM) KIT 1 each by Does not apply route 3 (three) times daily.  1 each  0  . clopidogrel (PLAVIX) 75 MG tablet Take 1 tablet (75 mg total) by mouth daily.  30 tablet  11  . escitalopram (LEXAPRO) 20 MG tablet Take 1 tablet (20 mg total) by mouth daily.  30 tablet  6  . fluticasone (FLONASE) 50 MCG/ACT nasal spray Place 1 spray into the nose 2 (two) times daily.  16 g  2  . Fluticasone-Salmeterol (ADVAIR DISKUS) 250-50 MCG/DOSE AEPB Inhale 1 puff into the lungs 2 (two) times daily.  1 each  5  . glucose blood (TRUETRACK TEST) test strip Use as instructed to test blood sugar 3 times per day.  Dx: ICD-9 250.82  100 each  2  . HUMALOG MIX 75/25 KWIKPEN (75-25) 100 UNIT/ML SUSP INJECT SUBCUTANEOUSLY 120 UNITS IN THE MORNING AND 65 UNITS AT NIGHT  60 mL  5  . hydrochlorothiazide (HYDRODIURIL) 25 MG tablet TAKE 1 TABLET (25 MG TOTAL) BY MOUTH DAILY.  90 tablet  1  . Insulin Pen Needle (PEN NEEDLES 31GX5/16") 31G X 8 MM MISC 60 Units by Does not  apply route 3 (three) times daily with meals as needed.  60 each  3  . NEXIUM 20 MG capsule TAKE 1 CAPSULE (20 MG TOTAL) BY MOUTH DAILY.  30 capsule  4  . ONETOUCH DELICA LANCETS MISC Use as instructed to test blood sugar 3 times per day.  Dx: ICD-9 250.82  100 each  2  . oxyCODONE-acetaminophen (PERCOCET) 7.5-325 MG per tablet Take 1 tablet by mouth every 6 (six) hours as needed for pain.  120 tablet  0  . pregabalin (LYRICA) 100 MG capsule Take 2 capsules (200 mg total) by mouth 3 (three) times daily.  160 capsule  3   No family history on file.  History   Social History  . Marital Status: Single    Spouse Name: N/A    Number of Children: N/A  . Years of Education: N/A   Social History Main Topics  . Smoking status: Current Every Day Smoker -- 0.5 packs/day for 40 years    Types: Cigarettes  . Smokeless tobacco: Never Used   Comment: stopped x 8 days  . Alcohol Use: No  . Drug Use: No  . Sexually Active: Not on file   Other Topics Concern  . Not on file   Social History Narrative  . No narrative on file   Review of Systems: Constitutional: Denies fever, chills, diaphoresis, appetite change and fatigue.  HEENT: Denies photophobia, eye pain, redness, hearing loss, ear pain, congestion, sore throat, rhinorrhea, sneezing, mouth sores, trouble swallowing, neck pain, neck stiffness and tinnitus.  Chronic "crossed eyes" Respiratory: Denies SOB, cough, chest tightness,  and wheezing.   Cardiovascular: Denies chest pain, palpitations and leg swelling.  Gastrointestinal: Denies nausea, vomiting, abdominal pain, diarrhea, constipation, blood in stool and abdominal distention.  Genitourinary: Denies dysuria, urgency, frequency, hematuria, flank pain and difficulty urinating.  Musculoskeletal: Chronic back pain Neurological: Denies dizziness, seizures, syncope, weakness, light-headedness, numbness and headaches.  Psychiatric/Behavioral: Denies suicidal ideation, confusion,  nervousness  Objective:  Physical Exam: Filed Vitals:   09/23/12 0940  BP: 135/74  Pulse: 74  Temp: 97 F (36.1 C)  TempSrc: Oral  Weight: 282 lb 3.4 oz (128.01 kg)   Constitutional: Vital signs reviewed.  Patient is an obese woman in no acute distress and cooperative with exam.  Eyes: PERRL, EOMI, conjunctivae normal, No scleral icterus.  Neck: Supple, Trachea midline normal ROM, No JVD, mass, thyromegaly, or carotid bruit present.  Cardiovascular: RRR, S1 normal, S2 normal, no MRG, pulses symmetric and intact bilaterally, b/l LE pretibial pitting edema  Pulmonary/Chest: CTAB, no wheezes, rales, or rhonchi Abdominal: Soft. Non-tender, non-distended, bowel sounds are normal, no masses, organomegaly, or guarding present.  Neurological: A&O x3 Skin: Warm, dry and intact. No rash, cyanosis, or clubbing. B/l feet callous with breaks in skin and hypertrophic nails Psychiatric: Somewhat depressed mood and affect. speech and behavior is normal. Judgment and thought content normal. Cognition and memory are normal.   Assessment & Plan:  Case and care discussed with Dr. Eben Burow. Patient to follow up in 2 months regarding DM & depression.  Please see problem oriented charting for further details.

## 2012-09-23 NOTE — Assessment & Plan Note (Signed)
Refilled Percocet 7.5-325 1 tab q6h prn #120 for the month as per pain contract today.

## 2012-09-23 NOTE — Assessment & Plan Note (Signed)
No glucometer or diary today.  Counseled on importance of glucose control.  She has met with dietician in 02/2012.  She seems to understand long term complications of DM, and repeats them to me ("bottom line, death, but also vital organ damage to my kidneys and eyes").  Continue current regimen of Humalog, as we cannot make changes without review of glucometer, and she has not been consistent with insulin.  -RTC in 2 months for DM follow up with PCP

## 2012-09-24 ENCOUNTER — Ambulatory Visit: Payer: Medicare Other

## 2012-10-04 ENCOUNTER — Ambulatory Visit (HOSPITAL_COMMUNITY)
Admission: RE | Admit: 2012-10-04 | Discharge: 2012-10-04 | Disposition: A | Discharge status: Home / Self Care | Payer: Medicare Other | Source: Ambulatory Visit | Attending: Internal Medicine | Admitting: Internal Medicine

## 2012-10-04 DIAGNOSIS — Z1231 Encounter for screening mammogram for malignant neoplasm of breast: Secondary | ICD-10-CM | POA: Insufficient documentation

## 2012-10-15 ENCOUNTER — Telehealth: Payer: Self-pay | Admitting: *Deleted

## 2012-10-15 NOTE — Telephone Encounter (Signed)
Pt calls and states she has a terrible cough and would like some cough syrup, she is made aware that her insurance will not pay for cough syrups anymore, she is offered an appt and states she is not "running a fever" and has no other symptoms except the cough, she is ask to go to urg care this wkend if she becomes worse, she is going to speak to her pharmacist about otc's besides the robitussin she has been using and if no better by Monday agrees to call for an appt monday

## 2012-10-19 ENCOUNTER — Telehealth: Payer: Self-pay | Admitting: *Deleted

## 2012-10-19 NOTE — Telephone Encounter (Signed)
Pt calls and states she found 2 lumps in her abdomen appr 1 week ago, she is worried and desires to be seen, she needs to make transportation arrangements and is called back, she has now made arrangements and will be seen wed 10/30 at 1330 by medical student

## 2012-10-19 NOTE — Telephone Encounter (Signed)
Agree, call for appointment if not better.

## 2012-10-20 ENCOUNTER — Encounter: Payer: Self-pay | Admitting: Internal Medicine

## 2012-10-20 ENCOUNTER — Ambulatory Visit (INDEPENDENT_AMBULATORY_CARE_PROVIDER_SITE_OTHER): Payer: Medicare Other | Admitting: Internal Medicine

## 2012-10-20 VITALS — BP 153/79 | HR 69 | Temp 97.9°F | Ht 66.0 in | Wt 278.9 lb

## 2012-10-20 DIAGNOSIS — L723 Sebaceous cyst: Secondary | ICD-10-CM

## 2012-10-20 DIAGNOSIS — E119 Type 2 diabetes mellitus without complications: Secondary | ICD-10-CM

## 2012-10-20 DIAGNOSIS — G894 Chronic pain syndrome: Secondary | ICD-10-CM

## 2012-10-20 DIAGNOSIS — L720 Epidermal cyst: Secondary | ICD-10-CM

## 2012-10-20 DIAGNOSIS — D235 Other benign neoplasm of skin of trunk: Secondary | ICD-10-CM | POA: Insufficient documentation

## 2012-10-20 LAB — GLUCOSE, CAPILLARY: Glucose-Capillary: 269 mg/dL — ABNORMAL HIGH (ref 70–99)

## 2012-10-20 MED ORDER — OXYCODONE-ACETAMINOPHEN 7.5-325 MG PO TABS: 1.0000 | ORAL_TABLET | Freq: Four times a day (QID) | ORAL | Status: DC | PRN

## 2012-10-20 MED ORDER — DICLOFENAC SODIUM 1 % TD GEL: 2.0000 g | Freq: Three times a day (TID) | TRANSDERMAL | Status: DC

## 2012-10-20 NOTE — Assessment & Plan Note (Signed)
Patient is getting her medication appropriately per review of the [place] [employer] database and recent UDS is appropriate.   Will fill pain medication as per pain contract for refill in November.

## 2012-10-20 NOTE — Progress Notes (Signed)
  Subjective:    [name], female    DOB: 1961-03-23, 51 y.o.   MRN: [id number]  CC: Lumps in abdomen for 1 week.   HPI   Jennifer [name] is a 51yo woman who presents for an acute visit concerning new onset of "lumps in her abdomen."  She first noticed these lumps about 2 weeks ago when she was lying on her stomach in bed.  They are located under the skin in the periumbilical region of her abdomen.  She reports a 4-5 out of 10 on the pain scale, tingling/stinging pain which has been constant.  She has taken her pain medication, percocet, without relief of this symptom. She does not note anything that makes the pain worse at this time except lying on them.  She is injecting her insulin in her lower abdomen, much lower than where these lesions are found.    She has a personal history of multiple ganglion cysts on her wrist and in her breasts.  She has had > 10 removed in the past by a Development worker, international aid.    She reports that she does sweat at night, but denies any weight loss or fevers.  She also has had mild diarrhea over the past few weeks.   Further symptoms include a cough for the past week which is slowly improving on its own, denies sob or sputum production.   FH: cousin with colon cancer at 45yo, otherwise, no history of GI cancers. She recently had a colonoscopy where polyps were found, but no pathology report was found in epic.    She does continue to smoke.   She requests a refill of her percocet.   Review of Systems  Constitutional: Positive for diaphoresis. Negative for chills and unexpected weight change.  HENT: Positive for congestion, sore throat and rhinorrhea. Negative for ear discharge.   Respiratory: Positive for cough. Negative for chest tightness, shortness of breath and wheezing.   Cardiovascular: Positive for leg swelling. Negative for chest pain.  Gastrointestinal: Positive for diarrhea. Negative for nausea, vomiting, abdominal pain, constipation, blood in  stool, abdominal distention, anal bleeding and rectal pain.  Genitourinary: Positive for frequency. Negative for dysuria.  Musculoskeletal: Positive for back pain and arthralgias.  Skin: Negative for color change and rash.  Hematological: Negative for adenopathy.       Objective:   Physical Exam  Constitutional: She is oriented to person, place, and time. She appears well-developed and well-nourished. No distress.  HENT:  Head: Normocephalic and atraumatic.  Eyes: EOM are normal. No scleral icterus.  Cardiovascular: Normal rate, regular rhythm, normal heart sounds and intact distal pulses.   No murmur heard. Pulmonary/Chest: Effort normal and breath sounds normal. No respiratory distress. She has no wheezes.  Abdominal: Soft. Bowel sounds are normal. She exhibits no distension and no mass. There is no tenderness. There is no rebound and no guarding.  Musculoskeletal: She exhibits edema. She exhibits no tenderness.  Neurological: She is alert and oriented to person, place, and time.  Skin: Skin is warm and dry. Lesion noted. No abrasion, no bruising, no ecchymosis and no rash noted. She is not diaphoretic. No erythema.     Psychiatric: She has a normal mood and affect. Her behavior is normal.       Assessment & Plan:  Please see problem oriented charting.

## 2012-10-20 NOTE — Assessment & Plan Note (Signed)
These small lesions likely represent epidermoid cysts of the abdomen.  They are deeply subcutaneous and do not have a punctate area, which detracts from the diagnosis, however.  They could be lipomas that are slowly developing.  They are small, movable and have no concerning erythema or tenderness to palpation.  There is no overlying rash or skin change.    Recommended that they be watched closely.  She has an appointment with her PCP Dr. Lonzo Cloud on 11/21 and they can be evaluated at that time.   She reports pain that is constant from the cysts, however, her oral pain medication does not affect them.  Will give a trial of voltaren gel, though I am not sure this will help with the tingling sensation she is experiencing.  She is to call in to the clinic if it does not help and the medication can be stopped at that time.

## 2012-10-20 NOTE — Patient Instructions (Addendum)
Ms. Hickey -   You presented to clinic for evaluation of two lumps (nodules) in your abdomen.  These are likely epidermoid cysts or lipomas.  Since they are relatively small, it would be best to observe them for the next month and see if they are changing or improving.  Epidermoid cysts can sometimes get better on their own.  Since they are causing you discomfort, you will have a prescription for diclofenac gel to try over the site of the lumps.  Please call the clinic and let us know if this is not working.   You will also get a refill of your Percocet today.    You have an appointment with your PCP Dr. Lonzo Cloud on 11/21.  Please keep this appointment.   Thank you.   Epidermal Cyst An epidermal cyst is usually a small, painless lump under the skin. Cysts often occur on the face, neck, stomach, chest, or genitals. The cyst may be filled with a bad smelling paste. Do not pop your cyst. Popping the cyst can cause pain and puffiness (swelling).  HOME CARE   Only take medicines as told by your doctor.   GET HELP RIGHT AWAY IF:  Your cyst is tender, red, or puffy.  You are not getting better, or you are getting worse.  You have any questions or concerns.   MAKE SURE YOU:  Understand these instructions.  Will watch your condition.  Will get help right away if you are not doing well or get worse.   Document Released: 01/15/2005 Document Revised: 06/08/2012 Document Reviewed: 06/16/2011 [facility] Patient Information 2013 [place], [place].

## 2012-10-20 NOTE — Progress Notes (Signed)
Subjective:     [name], female   DOB: August 09, 1961, 51 y.o.   MRN: [id number]  HPI Jennifer [name] is a 51 year old lady with a history of DM, HTN, chronic pain syndrome, fibromyalgia, and adehsive capsulitis of the left shoulder who presents with concern about '2 lumps in her abdomen.' She first noted the lumps in her abdomen about 2 weeks ago. Patient has not had lumps in stomach before. The lumps provide constant stinging pain, rating 4 or 5/10. She had been having pain in her stomach and she was lying down when she noticed them for the first time. She injects insulin into lower abdomen, in a different location than where the lumps are.  Hx of 'ganglion cysts,' removed from right wrist and 19 total removed from both breasts removed at [facility] b/w 1983 and 1997.  Smoking about 0.75 ppd since age 63. Cousin died of CRC at age 51.  Review of Systems Positive for: Night sweats Weight gain Diarrhea Negative for: previous hx of cancer Negative for hematochezia or melena.     Objective:   Physical Exam  Abdominal: Bowel sounds are normal.    Subcutaneous nodules approximately 0.25 cm in diameter.      Assessment:     **Abdominal Mass- Jennifer [name] has a number of risk factors for colorectal carcinoma, including african-american race, personal history of colonic polyps, diabetes, and obesity. Her last colonoscopy was performed 04/01/2011, at which time several hyperplastic polyps were removed. Given the location, size, character, and history of the cysts, it is unlikely to represent CRC or a malignant process.    Plan:     **Abdominal lump- freely mobile masses in the subcutaneous layer approximately 0.5 cm in diameter. Likely an epidermoid cyst, possibly a lipoma -Warm compress -volterain gel -1 month followup  **Pain-Recent UDS ruled out illicit drug use. Will refill vicodin 7.5/325 q6 hrs PRN, 120 tablets on 10/24/2012 as per pain contract. Last refill was  09/23/2012.

## 2012-11-01 ENCOUNTER — Other Ambulatory Visit: Payer: Self-pay | Admitting: Internal Medicine

## 2012-11-01 NOTE — Assessment & Plan Note (Signed)
Initially diagnosed in 07/2011 and was seen by sports med.  She has not been back and has not done physical therapy.  She states that she has been seen at [facility] and [facility] for this in the past.  If this continues to be an issue we could consider referring her back to them.

## 2012-11-01 NOTE — Assessment & Plan Note (Signed)
Refilled her pain medication on 08/24/12

## 2012-11-01 NOTE — Assessment & Plan Note (Signed)
Lab Results  Component Value Date   NA 142 05/26/2012   K 4.0 05/26/2012   CL 104 05/26/2012   CO2 28 05/26/2012   BUN 13 05/26/2012   CREATININE 0.77 05/26/2012   CREATININE 0.67 09/04/2011   BP Readings from Last 3 Encounters:  08/24/12 128/76  07/21/12 132/85  07/09/12 115/72   Assessment: Hypertension control:  controlled  Progress toward goals:  at goal Barriers to meeting goals:  no barriers identified  Plan: Hypertension treatment:  continue current medications encouraged to continue her medications as prescribed.

## 2012-11-01 NOTE — Assessment & Plan Note (Signed)
She has a history of multiple breast lumps and biopsies all of which have been benign per her report.  She is due for a repeat mammogram so we will send the referral today.

## 2012-11-01 NOTE — Assessment & Plan Note (Signed)
Lab Results  Component Value Date   HGBA1C 8.3 08/24/2012   HGBA1C 8.3 05/26/2012   HGBA1C 8.5 02/24/2012   Lab Results  Component Value Date   MICROALBUR 0.50 07/09/2012   LDLCALC 109* 07/09/2012   CREATININE 0.77 05/26/2012   Her A1C is essentially unchanged from previous.  We will have her keep a food diary with her CBG readings and when she takes her insulin to try to identify where we can make adjustments to her regimen or diet to help get her under better control.  She will keep these for a month and follow up in the [facility] at that time.

## 2012-11-02 ENCOUNTER — Other Ambulatory Visit: Payer: Self-pay | Admitting: Internal Medicine

## 2012-11-04 ENCOUNTER — Other Ambulatory Visit: Payer: Self-pay | Admitting: Internal Medicine

## 2012-11-11 ENCOUNTER — Ambulatory Visit (INDEPENDENT_AMBULATORY_CARE_PROVIDER_SITE_OTHER): Payer: Medicare Other | Admitting: Internal Medicine

## 2012-11-11 ENCOUNTER — Encounter: Payer: Self-pay | Admitting: Internal Medicine

## 2012-11-11 ENCOUNTER — Telehealth: Payer: Self-pay | Admitting: Internal Medicine

## 2012-11-11 VITALS — BP 167/84 | HR 80 | Temp 97.9°F | Ht 66.0 in | Wt 279.7 lb

## 2012-11-11 DIAGNOSIS — IMO0001 Reserved for inherently not codable concepts without codable children: Secondary | ICD-10-CM

## 2012-11-11 DIAGNOSIS — E785 Hyperlipidemia, unspecified: Secondary | ICD-10-CM

## 2012-11-11 DIAGNOSIS — L738 Other specified follicular disorders: Secondary | ICD-10-CM

## 2012-11-11 DIAGNOSIS — E1169 Type 2 diabetes mellitus with other specified complication: Secondary | ICD-10-CM

## 2012-11-11 DIAGNOSIS — IMO0002 Reserved for concepts with insufficient information to code with codable children: Secondary | ICD-10-CM

## 2012-11-11 DIAGNOSIS — F329 Major depressive disorder, single episode, unspecified: Secondary | ICD-10-CM

## 2012-11-11 DIAGNOSIS — E1165 Type 2 diabetes mellitus with hyperglycemia: Secondary | ICD-10-CM

## 2012-11-11 DIAGNOSIS — R259 Unspecified abnormal involuntary movements: Secondary | ICD-10-CM

## 2012-11-11 DIAGNOSIS — G252 Other specified forms of tremor: Secondary | ICD-10-CM

## 2012-11-11 DIAGNOSIS — F3289 Other specified depressive episodes: Secondary | ICD-10-CM

## 2012-11-11 DIAGNOSIS — L739 Follicular disorder, unspecified: Secondary | ICD-10-CM

## 2012-11-11 DIAGNOSIS — M797 Fibromyalgia: Secondary | ICD-10-CM

## 2012-11-11 LAB — GLUCOSE, CAPILLARY: Glucose-Capillary: 380 mg/dL — ABNORMAL HIGH (ref 70–99)

## 2012-11-11 MED ORDER — SULFAMETHOXAZOLE-TRIMETHOPRIM 400-80 MG PO TABS: 1.0000 | ORAL_TABLET | Freq: Two times a day (BID) | ORAL | Status: AC

## 2012-11-11 MED ORDER — METFORMIN HCL 1000 MG PO TABS: 1000.0000 mg | ORAL_TABLET | Freq: Two times a day (BID) | ORAL | Status: DC

## 2012-11-11 MED ORDER — DULOXETINE HCL 30 MG PO CPEP: 30.0000 mg | ORAL_CAPSULE | Freq: Every day | ORAL | Status: DC

## 2012-11-11 MED ORDER — PRAVASTATIN SODIUM 40 MG PO TABS: 40.0000 mg | ORAL_TABLET | Freq: Every evening | ORAL | Status: DC

## 2012-11-11 MED ORDER — DOXYCYCLINE HYCLATE 100 MG PO TABS: 100.0000 mg | ORAL_TABLET | Freq: Two times a day (BID) | ORAL | Status: DC

## 2012-11-11 NOTE — Progress Notes (Signed)
Doxycycline, Cymbalta, Metformin, Pravachol, and Septra rxs called to [facility] per pt's request. Also called  [facility] to cancel electronic rxs; pharmacy tech stated they have not been received yet.

## 2012-11-11 NOTE — Progress Notes (Signed)
Subjective:     [name], female   DOB: October 11, 1961, 51 y.o.   MRN: [id number]  HPI 51 year old lady come with complaints of pus draining from papules on her anterior abdominal wall from past few days. No fever associated. She does not want to quit smoking since the last time she quit she put on pounds . She forgets to take Insulin sometimes and her sugars are uncontrolled. She says she does not eat the right food and is going to try to eat right. She had diarrhea on metformin but is willing to try the medication again. She is on a big dose of insulin. She complains of a coarse tremor from many months. She has broken many a piece of cutlery secondary to this. Her mother has Parkinson's. Her depression is not well controlled on Amitriptyline. She says she had some kidney problems on some cholesterol medication many years ago. She has pain all over the body  Review of Systems 12 point review of system was performed and is negative except as documented in the HPI section.     Objective:   Physical Exam  Constitutional: She is oriented to person, place, and time. She appears well-developed and well-nourished.  HENT:  Head: Normocephalic and atraumatic.  Eyes: Conjunctivae normal are normal. Pupils are equal, round, and reactive to light.  Neck: Normal range of motion.  Cardiovascular: Normal rate and regular rhythm.   Pulmonary/Chest: Effort normal. She has rales.  Abdominal: Soft. There is tenderness.       Multiple small pustules and single plaque with an open wound. No pus drainage but pus found on the dressing.  Musculoskeletal: She exhibits edema.       Left leg greater than right leg.  Neurological: She is alert and oriented to person, place, and time.       Intentional coarse tremor right more than left upper extremity. Tone normal. Gait normal       Assessment:    DM type II : Uncontrolled. Will start Metformin and given hand out on diet. No episodes of  hypoglycemia. WIll repeat A 1c next month. She has been given a hand out on how to increase the dose of metformin to decrease GI upset. She says she has seen ophthalmologist this February.  Tremor :  ? Benign positional vertigo Neurology referral   Depression/ Fibromyalgia: will stop amytriptylline and start on Cymbalta.   Staph folliculitis: No active drainage to send for culture. Antibiotic course. Advised on soaking with chlorox once a week.   PVD : on aspirin and plavix secondary to bilateral stents.     Plan:

## 2012-11-11 NOTE — Telephone Encounter (Signed)
Please see the telephone note documented

## 2012-11-11 NOTE — Patient Instructions (Addendum)
Your sugars are not under control. Please restart Metformin according to the instructions printed out. Please start Pravastatin for your cholesterol levels to be lowered and call us if you find any complications with the medication. Please stop amitriptyline and start Cymb alta at night. Please reassess your diet. GetDiabetes Meal Planning Guide The diabetes meal planning guide is a tool to help you plan your meals and snacks. It is important for people with diabetes to manage their blood glucose (sugar) levels. Choosing the right foods and the right amounts throughout your day will help control your blood glucose. Eating right can even help you improve your blood pressure and reach or maintain a healthy weight. CARBOHYDRATE COUNTING MADE EASY When you eat carbohydrates, they turn to sugar. This raises your blood glucose level. Counting carbohydrates can help you control this level so you feel better. When you plan your meals by counting carbohydrates, you can have more flexibility in what you eat and balance your medicine with your food intake. Carbohydrate counting simply means adding up the total amount of carbohydrate grams in your meals and snacks. Try to eat about the same amount at each meal. Foods with carbohydrates are listed below. Each portion below is 1 carbohydrate serving or 15 grams of carbohydrates. Ask your dietician how many grams of carbohydrates you should eat at each meal or snack. Grains and Starches  1 slice bread.   English muffin or hotdog/hamburger bun.   cup cold cereal (unsweetened).   cup cooked pasta or rice.   cup starchy vegetables (corn, potatoes, peas, beans, winter squash).  1 tortilla (6 inches).   bagel.  1 waffle or pancake (size of a CD).   cup cooked cereal.  4 to 6 small crackers. *Whole grain is recommended. Fruit  1 cup fresh unsweetened berries, melon, papaya, pineapple.  1 small fresh fruit.   banana or mango.   cup fruit  juice (4 oz unsweetened).   cup canned fruit in natural juice or water.  2 tbs dried fruit.  12 to 15 grapes or cherries. Milk and Yogurt  1 cup fat-free or 1% milk.  1 cup soy milk.  6 oz light yogurt with sugar-free sweetener.  6 oz low-fat soy yogurt.  6 oz plain yogurt. Vegetables  1 cup raw or  cup cooked is counted as 0 carbohydrates or a "free" food.  If you eat 3 or more servings at 1 meal, count them as 1 carbohydrate serving. Other Carbohydrates   oz chips or pretzels.   cup ice cream or frozen yogurt.   cup sherbet or sorbet.  2 inch square cake, no frosting.  1 tbs honey, sugar, jam, jelly, or syrup.  2 small cookies.  3 squares of graham crackers.  3 cups popcorn.  6 crackers.  1 cup broth-based soup.  Count 1 cup casserole or other mixed foods as 2 carbohydrate servings.  Foods with less than 20 calories in a serving may be counted as 0 carbohydrates or a "free" food. You may want to purchase a book or computer software that lists the carbohydrate gram counts of different foods. In addition, the nutrition facts panel on the labels of the foods you eat are a good source of this information. The label will tell you how big the serving size is and the total number of carbohydrate grams you will be eating per serving. Divide this number by 15 to obtain the number of carbohydrate servings in a portion. Remember, 1 carbohydrate serving equals  15 grams of carbohydrate. SERVING SIZES Measuring foods and serving sizes helps you make sure you are getting the right amount of food. The list below tells how big or small some common serving sizes are.  1 oz.........4 stacked dice.  3 oz........Marland KitchenDeck of cards.  1 tsp.......Marland KitchenTip of little finger.  1 tbs......Marland KitchenMarland KitchenThumb.  2 tbs.......Marland KitchenGolf ball.   cup......Marland KitchenHalf of a fist.  1 cup.......Marland KitchenA fist. SAMPLE DIABETES MEAL PLAN Below is a sample meal plan that includes foods from the grain and starches,  dairy, vegetable, fruit, and meat groups. A dietician can individualize a meal plan to fit your calorie needs and tell you the number of servings needed from each food group. However, controlling the total amount of carbohydrates in your meal or snack is more important than making sure you include all of the food groups at every meal. You may interchange carbohydrate containing foods (dairy, starches, and fruits). The meal plan below is an example of a 2000 calorie diet using carbohydrate counting. This meal plan has 17 carbohydrate servings. Breakfast  1 cup oatmeal (2 carb servings).   cup light yogurt (1 carb serving).  1 cup blueberries (1 carb serving).   cup almonds. Snack  1 large apple (2 carb servings).  1 low-fat string cheese stick. Lunch  Chicken breast salad.  1 cup spinach.   cup chopped tomatoes.  2 oz chicken breast, sliced.  2 tbs low-fat [place] dressing.  12 whole-wheat crackers (2 carb servings).  12 to 15 grapes (1 carb serving).  1 cup low-fat milk (1 carb serving). Snack  1 cup carrots.   cup hummus (1 carb serving). Dinner  3 oz broiled salmon.  1 cup brown rice (3 carb servings). Snack  1  cups steamed broccoli (1 carb serving) drizzled with 1 tsp olive oil and lemon juice.  1 cup light pudding (2 carb servings). DIABETES MEAL PLANNING WORKSHEET Your dietician can use this worksheet to help you decide how many servings of foods and what types of foods are right for you.  BREAKFAST Food Group and Servings / Carb Servings Grain/Starches __________________________________ Dairy __________________________________________ Vegetable ______________________________________ Fruit ___________________________________________ Meat __________________________________________ Fat ____________________________________________ LUNCH Food Group and Servings / Carb Servings Grain/Starches ___________________________________ Dairy  ___________________________________________ Fruit ____________________________________________ Meat ___________________________________________ Fat _____________________________________________ Laural Golden Food Group and Servings / Carb Servings Grain/Starches ___________________________________ Dairy ___________________________________________ Fruit ____________________________________________ Meat ___________________________________________ Fat _____________________________________________ SNACKS Food Group and Servings / Carb Servings Grain/Starches ___________________________________ Dairy ___________________________________________ Vegetable _______________________________________ Fruit ____________________________________________ Meat ___________________________________________ Fat _____________________________________________ DAILY TOTALS Starches _________________________ Vegetable ________________________ Fruit ____________________________ Dairy ____________________________ Meat ____________________________ Fat ______________________________ Document Released: 09/04/2005 Document Revised: 03/01/2012 Document Reviewed: 07/16/2009 Exit[employer] Patient Information 2013 [place], [place].  Hba1c nest month.

## 2012-11-12 ENCOUNTER — Other Ambulatory Visit: Payer: Self-pay | Admitting: Internal Medicine

## 2012-11-12 LAB — URINALYSIS, MICROSCOPIC ONLY
Bacteria, UA: NONE SEEN
Casts: NONE SEEN
Crystals: NONE SEEN
Squamous Epithelial / LPF: NONE SEEN

## 2012-11-12 LAB — URINALYSIS, ROUTINE W REFLEX MICROSCOPIC
Bilirubin Urine: NEGATIVE
Glucose, UA: >1000 mg/dL — AB
Hgb urine dipstick: NEGATIVE
Ketones, ur: NEGATIVE mg/dL
Leukocytes, UA: NEGATIVE
Nitrite: NEGATIVE
Protein, ur: NEGATIVE mg/dL
Specific Gravity, Urine: 1.03 (ref 1.005–1.030)
Urobilinogen, UA: 1 mg/dL (ref 0.0–1.0)
pH: 6 (ref 5.0–8.0)

## 2012-11-15 ENCOUNTER — Other Ambulatory Visit: Payer: Self-pay | Admitting: Internal Medicine

## 2012-11-23 ENCOUNTER — Other Ambulatory Visit: Payer: Self-pay | Admitting: *Deleted

## 2012-11-23 DIAGNOSIS — G894 Chronic pain syndrome: Secondary | ICD-10-CM

## 2012-11-23 NOTE — Telephone Encounter (Signed)
Pt will pick up on Friday. Last refill 10/30 Pt's pharmacy is not able to get this strength percocet so pt will need to look around to other pharmacy's

## 2012-11-24 MED ORDER — OXYCODONE-ACETAMINOPHEN 7.5-325 MG PO TABS: 1.0000 | ORAL_TABLET | Freq: Four times a day (QID) | ORAL | Status: DC | PRN

## 2012-11-24 NOTE — Telephone Encounter (Signed)
Refill printed and signed - nurse to complete. 

## 2012-11-26 ENCOUNTER — Other Ambulatory Visit (INDEPENDENT_AMBULATORY_CARE_PROVIDER_SITE_OTHER): Payer: Medicare Other

## 2012-11-26 DIAGNOSIS — E1165 Type 2 diabetes mellitus with hyperglycemia: Secondary | ICD-10-CM

## 2012-11-26 DIAGNOSIS — E1169 Type 2 diabetes mellitus with other specified complication: Secondary | ICD-10-CM

## 2012-11-26 DIAGNOSIS — IMO0002 Reserved for concepts with insufficient information to code with codable children: Secondary | ICD-10-CM

## 2012-11-26 LAB — GLUCOSE, CAPILLARY: Glucose-Capillary: 273 mg/dL — ABNORMAL HIGH (ref 70–99)

## 2012-11-26 LAB — POCT GLYCOSYLATED HEMOGLOBIN (HGB A1C): Hemoglobin A1C: 9

## 2012-11-30 ENCOUNTER — Other Ambulatory Visit: Payer: Self-pay | Admitting: *Deleted

## 2012-11-30 DIAGNOSIS — F32A Depression, unspecified: Secondary | ICD-10-CM

## 2012-11-30 DIAGNOSIS — F329 Major depressive disorder, single episode, unspecified: Secondary | ICD-10-CM

## 2012-12-01 ENCOUNTER — Other Ambulatory Visit: Payer: Self-pay | Admitting: Internal Medicine

## 2012-12-01 ENCOUNTER — Telehealth: Payer: Self-pay | Admitting: Licensed Clinical Social Worker

## 2012-12-01 NOTE — Telephone Encounter (Signed)
Ms. Peragine placed call to CSW. Pt inquiring if any assistance is available for prescription.  Pt states her Medicare prescription coverage was cut off at the end of November.  When pt attempted to fill prescription for Lyrica and Oxycodone was told insurance is not active until Jan 2014.  CSW discussed with triage RN, confirmed with pt's pharmacy.  CSW was able to print $25 copay coupon for Lyrica and $27 coupon for Oxycodone #[id number].  Triage RN to discuss further with pt.

## 2012-12-02 ENCOUNTER — Other Ambulatory Visit: Payer: Self-pay | Admitting: Internal Medicine

## 2012-12-02 NOTE — Telephone Encounter (Signed)
Called to pharm, month supply should have been 180 not 160, changed verbally

## 2012-12-03 MED ORDER — GLUCOSE BLOOD VI STRP: ORAL_STRIP | Status: DC

## 2012-12-09 ENCOUNTER — Other Ambulatory Visit: Payer: Self-pay | Admitting: *Deleted

## 2012-12-09 MED ORDER — GLUCOSE BLOOD VI STRP: ORAL_STRIP | Status: DC

## 2012-12-22 DIAGNOSIS — Z8679 Personal history of other diseases of the circulatory system: Secondary | ICD-10-CM

## 2012-12-22 HISTORY — DX: Personal history of other diseases of the circulatory system: Z86.79

## 2012-12-30 ENCOUNTER — Other Ambulatory Visit: Payer: Self-pay | Admitting: *Deleted

## 2012-12-30 ENCOUNTER — Encounter: Payer: Self-pay | Admitting: Internal Medicine

## 2012-12-30 DIAGNOSIS — G894 Chronic pain syndrome: Secondary | ICD-10-CM

## 2012-12-30 MED ORDER — OXYCODONE-ACETAMINOPHEN 7.5-325 MG PO TABS: 1.0000 | ORAL_TABLET | Freq: Four times a day (QID) | ORAL | Status: DC | PRN

## 2012-12-30 NOTE — Telephone Encounter (Signed)
Needs March appt with PCP

## 2012-12-31 ENCOUNTER — Other Ambulatory Visit: Payer: Self-pay | Admitting: Internal Medicine

## 2012-12-31 NOTE — Telephone Encounter (Signed)
Elavil was stopped 11/13 br Nutan and Cymbalta was started.

## 2013-01-04 ENCOUNTER — Other Ambulatory Visit: Payer: Self-pay | Admitting: Internal Medicine

## 2013-01-04 MED ORDER — GLUCOSE BLOOD VI STRP: ORAL_STRIP | Status: DC

## 2013-01-04 NOTE — Telephone Encounter (Signed)
Do you know why she is getting meds sent to [facility]? WIll she be back for March visit?

## 2013-01-04 NOTE — Telephone Encounter (Signed)
Pt requesting test strips rx be sent to [facility] not [facility].  Thanks

## 2013-01-04 NOTE — Telephone Encounter (Signed)
Pt requests test strip rx  Sent electronically to [facility].

## 2013-01-04 NOTE — Addendum Note (Signed)
Addended by: Blanch Media A on: 01/04/2013 01:40 PM   Modules accepted: Orders

## 2013-01-04 NOTE — Telephone Encounter (Signed)
Pt called again, no answer - message left.

## 2013-01-04 NOTE — Telephone Encounter (Signed)
Pt was called - no answer; message left to call the clinic about using [facility].

## 2013-01-04 NOTE — Telephone Encounter (Signed)
Front desk has sent letter to pt to sch March appt

## 2013-01-22 DIAGNOSIS — I330 Acute and subacute infective endocarditis: Secondary | ICD-10-CM | POA: Diagnosis present

## 2013-01-25 ENCOUNTER — Encounter (HOSPITAL_COMMUNITY): Payer: Self-pay | Admitting: *Deleted

## 2013-01-25 ENCOUNTER — Inpatient Hospital Stay (HOSPITAL_COMMUNITY)
Admission: EM | Admit: 2013-01-25 | Discharge: 2013-02-01 | DRG: 853 | Disposition: A | Discharge status: Home / Self Care | Payer: Medicare Other | Attending: Infectious Disease | Admitting: Infectious Disease

## 2013-01-25 ENCOUNTER — Emergency Department (HOSPITAL_COMMUNITY): Payer: Medicare Other

## 2013-01-25 DIAGNOSIS — I872 Venous insufficiency (chronic) (peripheral): Secondary | ICD-10-CM | POA: Diagnosis present

## 2013-01-25 DIAGNOSIS — E1159 Type 2 diabetes mellitus with other circulatory complications: Secondary | ICD-10-CM | POA: Diagnosis present

## 2013-01-25 DIAGNOSIS — IMO0002 Reserved for concepts with insufficient information to code with codable children: Secondary | ICD-10-CM

## 2013-01-25 DIAGNOSIS — E118 Type 2 diabetes mellitus with unspecified complications: Secondary | ICD-10-CM | POA: Diagnosis present

## 2013-01-25 DIAGNOSIS — E1142 Type 2 diabetes mellitus with diabetic polyneuropathy: Secondary | ICD-10-CM | POA: Diagnosis present

## 2013-01-25 DIAGNOSIS — E669 Obesity, unspecified: Secondary | ICD-10-CM | POA: Diagnosis present

## 2013-01-25 DIAGNOSIS — L03115 Cellulitis of right lower limb: Secondary | ICD-10-CM

## 2013-01-25 DIAGNOSIS — I10 Essential (primary) hypertension: Secondary | ICD-10-CM | POA: Diagnosis present

## 2013-01-25 DIAGNOSIS — I739 Peripheral vascular disease, unspecified: Secondary | ICD-10-CM | POA: Diagnosis present

## 2013-01-25 DIAGNOSIS — E11621 Type 2 diabetes mellitus with foot ulcer: Secondary | ICD-10-CM | POA: Diagnosis present

## 2013-01-25 DIAGNOSIS — L720 Epidermal cyst: Secondary | ICD-10-CM

## 2013-01-25 DIAGNOSIS — I330 Acute and subacute infective endocarditis: Secondary | ICD-10-CM | POA: Diagnosis present

## 2013-01-25 DIAGNOSIS — L03119 Cellulitis of unspecified part of limb: Secondary | ICD-10-CM | POA: Diagnosis present

## 2013-01-25 DIAGNOSIS — E1351 Other specified diabetes mellitus with diabetic peripheral angiopathy without gangrene: Secondary | ICD-10-CM | POA: Diagnosis present

## 2013-01-25 DIAGNOSIS — I339 Acute and subacute endocarditis, unspecified: Secondary | ICD-10-CM

## 2013-01-25 DIAGNOSIS — L02619 Cutaneous abscess of unspecified foot: Secondary | ICD-10-CM | POA: Diagnosis present

## 2013-01-25 DIAGNOSIS — B951 Streptococcus, group B, as the cause of diseases classified elsewhere: Secondary | ICD-10-CM

## 2013-01-25 DIAGNOSIS — E785 Hyperlipidemia, unspecified: Secondary | ICD-10-CM | POA: Diagnosis present

## 2013-01-25 DIAGNOSIS — R7881 Bacteremia: Principal | ICD-10-CM | POA: Diagnosis present

## 2013-01-25 DIAGNOSIS — E119 Type 2 diabetes mellitus without complications: Secondary | ICD-10-CM | POA: Diagnosis present

## 2013-01-25 DIAGNOSIS — E1149 Type 2 diabetes mellitus with other diabetic neurological complication: Secondary | ICD-10-CM | POA: Diagnosis present

## 2013-01-25 DIAGNOSIS — L97509 Non-pressure chronic ulcer of other part of unspecified foot with unspecified severity: Secondary | ICD-10-CM | POA: Diagnosis present

## 2013-01-25 DIAGNOSIS — Z9889 Other specified postprocedural states: Secondary | ICD-10-CM | POA: Diagnosis present

## 2013-01-25 DIAGNOSIS — F32A Depression, unspecified: Secondary | ICD-10-CM

## 2013-01-25 DIAGNOSIS — F329 Major depressive disorder, single episode, unspecified: Secondary | ICD-10-CM | POA: Diagnosis present

## 2013-01-25 DIAGNOSIS — M797 Fibromyalgia: Secondary | ICD-10-CM

## 2013-01-25 DIAGNOSIS — Z9181 History of falling: Secondary | ICD-10-CM

## 2013-01-25 DIAGNOSIS — IMO0001 Reserved for inherently not codable concepts without codable children: Secondary | ICD-10-CM | POA: Diagnosis present

## 2013-01-25 DIAGNOSIS — Z7982 Long term (current) use of aspirin: Secondary | ICD-10-CM

## 2013-01-25 DIAGNOSIS — Z794 Long term (current) use of insulin: Secondary | ICD-10-CM

## 2013-01-25 DIAGNOSIS — M7989 Other specified soft tissue disorders: Secondary | ICD-10-CM

## 2013-01-25 DIAGNOSIS — R55 Syncope and collapse: Secondary | ICD-10-CM

## 2013-01-25 DIAGNOSIS — J4489 Other specified chronic obstructive pulmonary disease: Secondary | ICD-10-CM | POA: Diagnosis present

## 2013-01-25 DIAGNOSIS — J441 Chronic obstructive pulmonary disease with (acute) exacerbation: Secondary | ICD-10-CM | POA: Diagnosis present

## 2013-01-25 DIAGNOSIS — B9561 Methicillin susceptible Staphylococcus aureus infection as the cause of diseases classified elsewhere: Secondary | ICD-10-CM

## 2013-01-25 DIAGNOSIS — H409 Unspecified glaucoma: Secondary | ICD-10-CM | POA: Diagnosis present

## 2013-01-25 DIAGNOSIS — E1165 Type 2 diabetes mellitus with hyperglycemia: Secondary | ICD-10-CM | POA: Diagnosis present

## 2013-01-25 DIAGNOSIS — J449 Chronic obstructive pulmonary disease, unspecified: Secondary | ICD-10-CM

## 2013-01-25 DIAGNOSIS — Z79899 Other long term (current) drug therapy: Secondary | ICD-10-CM

## 2013-01-25 DIAGNOSIS — K219 Gastro-esophageal reflux disease without esophagitis: Secondary | ICD-10-CM | POA: Diagnosis present

## 2013-01-25 DIAGNOSIS — F3289 Other specified depressive episodes: Secondary | ICD-10-CM

## 2013-01-25 DIAGNOSIS — A4902 Methicillin resistant Staphylococcus aureus infection, unspecified site: Secondary | ICD-10-CM | POA: Diagnosis present

## 2013-01-25 DIAGNOSIS — G894 Chronic pain syndrome: Secondary | ICD-10-CM

## 2013-01-25 DIAGNOSIS — I39 Endocarditis and heart valve disorders in diseases classified elsewhere: Secondary | ICD-10-CM

## 2013-01-25 DIAGNOSIS — J069 Acute upper respiratory infection, unspecified: Secondary | ICD-10-CM

## 2013-01-25 DIAGNOSIS — N189 Chronic kidney disease, unspecified: Secondary | ICD-10-CM | POA: Diagnosis present

## 2013-01-25 DIAGNOSIS — F17200 Nicotine dependence, unspecified, uncomplicated: Secondary | ICD-10-CM | POA: Diagnosis present

## 2013-01-25 DIAGNOSIS — I129 Hypertensive chronic kidney disease with stage 1 through stage 4 chronic kidney disease, or unspecified chronic kidney disease: Secondary | ICD-10-CM | POA: Diagnosis present

## 2013-01-25 DIAGNOSIS — J811 Chronic pulmonary edema: Secondary | ICD-10-CM | POA: Diagnosis present

## 2013-01-25 DIAGNOSIS — Z6841 Body Mass Index (BMI) 40.0 and over, adult: Secondary | ICD-10-CM

## 2013-01-25 HISTORY — DX: Unspecified asthma, uncomplicated: J45.909

## 2013-01-25 LAB — BASIC METABOLIC PANEL
BUN: 9 mg/dL (ref 6–23)
CO2: 26 mEq/L (ref 19–32)
Calcium: 8.7 mg/dL (ref 8.4–10.5)
Chloride: 91 mEq/L — ABNORMAL LOW (ref 96–112)
Creatinine, Ser: 0.78 mg/dL (ref 0.50–1.10)
GFR calc Af Amer: >90 mL/min (ref >90)
GFR calc non Af Amer: >90 mL/min (ref >90)
Glucose, Bld: 367 mg/dL — ABNORMAL HIGH (ref 70–99)
Potassium: 3 mEq/L — ABNORMAL LOW (ref 3.5–5.1)
Sodium: 131 mEq/L — ABNORMAL LOW (ref 135–145)

## 2013-01-25 LAB — POCT I-STAT TROPONIN I: Troponin i, poc: 0.01 ng/mL (ref 0.00–0.08)

## 2013-01-25 LAB — CBC WITH DIFFERENTIAL/PLATELET
Basophils Absolute: 0 10*3/uL (ref 0.0–0.1)
Basophils Relative: 0 % (ref 0–1)
Eosinophils Absolute: 0 10*3/uL (ref 0.0–0.7)
Eosinophils Relative: 0 % (ref 0–5)
HCT: 40.4 % (ref 36.0–46.0)
Hemoglobin: 13.6 g/dL (ref 12.0–15.0)
Lymphocytes Relative: 14 % (ref 12–46)
Lymphs Abs: 1.7 10*3/uL (ref 0.7–4.0)
MCH: 31.9 pg (ref 26.0–34.0)
MCHC: 33.7 g/dL (ref 30.0–36.0)
MCV: 94.8 fL (ref 78.0–100.0)
Monocytes Absolute: 0.7 10*3/uL (ref 0.1–1.0)
Monocytes Relative: 6 % (ref 3–12)
Neutro Abs: 9.7 10*3/uL — ABNORMAL HIGH (ref 1.7–7.7)
Neutrophils Relative %: 80 % — ABNORMAL HIGH (ref 43–77)
Platelets: 113 10*3/uL — ABNORMAL LOW (ref 150–400)
RBC: 4.26 MIL/uL (ref 3.87–5.11)
RDW: 14 % (ref 11.5–15.5)
WBC: 12.2 10*3/uL — ABNORMAL HIGH (ref 4.0–10.5)

## 2013-01-25 MED ORDER — IPRATROPIUM BROMIDE 0.02 % IN SOLN
0.5000 mg | Freq: Once | RESPIRATORY_TRACT | Status: AC
  Administered 2013-01-25: 0.5 mg via RESPIRATORY_TRACT
  Filled 2013-01-25: qty 2.5

## 2013-01-25 MED ORDER — FUROSEMIDE 10 MG/ML IJ SOLN
40.0000 mg | Freq: Once | INTRAMUSCULAR | Status: AC
  Administered 2013-01-25: 40 mg via INTRAVENOUS
  Filled 2013-01-25: qty 4

## 2013-01-25 MED ORDER — ALBUTEROL SULFATE (5 MG/ML) 0.5% IN NEBU
5.0000 mg | INHALATION_SOLUTION | Freq: Once | RESPIRATORY_TRACT | Status: AC
  Administered 2013-01-25: 5 mg via RESPIRATORY_TRACT
  Filled 2013-01-25: qty 1

## 2013-01-25 NOTE — ED Notes (Signed)
Xray tech at bedside.

## 2013-01-25 NOTE — ED Notes (Signed)
Pt reports she first noticed the ulcers on bottom of foot on Friday.

## 2013-01-25 NOTE — ED Notes (Addendum)
Pt states every time she stands since Friday she has been passing out. Pt states that she has a HA, throbbing since Friday as well. Pt states she passed out one time today. Pt denies changes in activity, medications. Pt states that she is dizzy Nauseated and vomited today as well. Pt states she also has ulcers on the bottom of her feet and is suppose to see an MD tomorrow for it.

## 2013-01-25 NOTE — ED Provider Notes (Signed)
History     CSN: [id number]  Arrival date & time 01/25/13  1911   First MD Initiated Contact with Patient 01/25/13 2100      Chief Complaint  Patient presents with  . Loss of Consciousness     Patient is a 52 y.o. female presenting with syncope. The history is provided by the patient and a relative.  Loss of Consciousness This is a new problem. Episode onset: several days ago. The problem occurs daily. The problem has been gradually worsening. Associated symptoms include shortness of breath. The symptoms are aggravated by standing. The symptoms are relieved by rest. She has tried rest for the symptoms. The treatment provided no relief.  pt reports for past several days she has been passing out - usually occurs when she stands. She reports during one episode she fell and hit her head.  She has headache and neck pain.  No active CP, but she does report SOB She also reports "bleeding blisters" on her feet She also reports nausea and vomiting   Past Medical History  Diagnosis Date  . Depression   . Diabetes mellitus   . GERD (gastroesophageal reflux disease)   . Hyperlipidemia   . Hypertension   . Chronic kidney disease   . Glaucoma   . COPD (chronic obstructive pulmonary disease)   . Chronic shoulder pain   . Chronic back pain   . PVD (peripheral vascular disease) with claudication   . Tinnitus   . Peripheral neuropathy   . Fibromyalgia   . Colon polyp   . Diverticula of colon   . Diverticulosis   . Asthma     Past Surgical History  Procedure Date  . Abdominal hysterectomy   . Tubal ligation   . Rotator cuff repair     bilateral  . Breast biopsy     multiple-benign per pt  . Wrist surgery     right  . Ganglion cyst excision     multiple  . Other surgical history     stents in lower ext    History reviewed. No pertinent family history.  History  Substance Use Topics  . Smoking status: Current Every Day Smoker -- 0.5 packs/day for 40 years    Types:  Cigarettes  . Smokeless tobacco: Never Used     Comment: Half pack or less.  . Alcohol Use: No    OB History    Grav Para Term Preterm Abortions TAB SAB Ect Mult Living                  Review of Systems  Constitutional: Positive for fatigue.  Respiratory: Positive for shortness of breath.   Cardiovascular: Positive for syncope.  Gastrointestinal: Positive for vomiting.  Musculoskeletal: Negative for back pain.  Neurological: Positive for syncope and weakness.  Psychiatric/Behavioral: Negative for agitation.  All other systems reviewed and are negative.    Allergies  Iohexol; Morphine sulfate; and Vicodin  Home Medications   Current Outpatient Rx  Name  Route  Sig  Dispense  Refill  . ADVAIR DISKUS 250-50 MCG/DOSE IN AEPB      INHALE 1 PUFF INTO THE LUNG TWICE A DAY   60 each   2   . ASPIRIN 81 MG PO TABS   Oral   Take 81 mg by mouth daily.           . ATENOLOL 100 MG PO TABS      TAKE 1 TABLET (100 MG TOTAL) BY MOUTH DAILY.  30 tablet   9   . BACLOFEN 10 MG PO TABS      TAKE 1 TABLET THREE TIMES A DAY   90 tablet   1   . BENAZEPRIL HCL 40 MG PO TABS      TAKE 1 TABLET BY MOUTH EVERY DAY   30 tablet   3   . CLOPIDOGREL BISULFATE 75 MG PO TABS   Oral   Take 1 tablet (75 mg total) by mouth daily.   30 tablet   11   . DICLOFENAC SODIUM 1 % TD GEL   Topical   Apply 2 g topically 3 (three) times daily. To abdomen   100 g   0   . DULOXETINE HCL 30 MG PO CPEP   Oral   Take 1 capsule (30 mg total) by mouth daily.   30 capsule   2   . ESCITALOPRAM OXALATE 20 MG PO TABS   Oral   Take 1 tablet (20 mg total) by mouth daily.   30 tablet   6   . FLUTICASONE PROPIONATE 50 MCG/ACT NA SUSP   Nasal   Place 1 spray into the nose 2 (two) times daily.   16 g   2   . HUMALOG MIX 75/25 KWIKPEN (75-25) 100 UNIT/ML [place] SUSP      INJECT SUBCUTANEOUSLY 120 UNITS IN THE MORNING AND 65 UNITS AT NIGHT   60 mL   2   . HYDROCHLOROTHIAZIDE 25 MG PO  TABS      TAKE 1 TABLET (25 MG TOTAL) BY MOUTH DAILY.   90 tablet   1   . LYRICA 100 MG PO CAPS      TAKE 2 CAPSULES BY MOUTH 3 TIMES DAILY   160 capsule   3   . METFORMIN HCL 1000 MG PO TABS   Oral   Take 1 tablet (1,000 mg total) by mouth 2 (two) times daily with a meal.   60 tablet   11   . NEXIUM 20 MG PO CPDR      TAKE 1 CAPSULE (20 MG TOTAL) BY MOUTH DAILY.   30 capsule   4   . OXYCODONE-ACETAMINOPHEN 7.5-325 MG PO TABS   Oral   Take 1 tablet by mouth every 6 (six) hours as needed for pain.   120 tablet   0     Rx 3 of 3. Please fill 60 days after the date on t ...   . PRAVASTATIN SODIUM 40 MG PO TABS   Oral   Take 1 tablet (40 mg total) by mouth every evening.   30 tablet   11   . PROAIR HFA 108 (90 BASE) MCG/ACT IN AERS      INHALE 2 PUFFS INTO THE LUNGS EVERY 6 HOURS AS NEEDED FOR WHEEZING OR SHORTNESS OF BREATH   8.5 each   5     BP 147/57  Pulse 112  Temp 98.7 F (37.1 C) (Oral)  Resp 24  SpO2 90%  Physical Exam CONSTITUTIONAL: Well developed/well nourished HEAD AND FACE: Normocephalic/atraumatic EYES: EOMI/PERRL ENMT: Mucous membranes moist, No evidence of facial/nasal trauma NECK: supple no meningeal signs SPINE:cervical spine tender.  No thoracic/lumbar tenderness. No bruising/crepitance/stepoffs noted to spine CV: S1/S2 noted, no murmurs/rubs/gallops noted LUNGS: tachypneic, diffuse wheezing noted bilaterally.  She is able to speak to me comfortably ABDOMEN: soft, nontender, no rebound or guarding GU:no cva tenderness NEURO: Pt is awake/alert, moves all extremitiesx4 EXTREMITIES: pulses normal, full ROM. She has blisters  to both feet with erythema noted to right foot.  No crepitance or bleeding noted.  No deformity noted to either lower extremity.  No injury to either UE SKIN: warm, color normal PSYCH: no abnormalities of mood noted  ED Course  Procedures 11:09 PM Pt with recent multiple syncopal episodes now reporting headache and  neck pain.  She is tachypneic with wheezing, nebs were ordered.  I have also ordered lasix for mild pulmonary edema by CXR (though last echo showed normal EF) Ct head and ct cspine ordered as well as ccollar due to her fall  11:52 PM D/w internal medicine, her primary and will admit for further workup of syncope, COPD and mild pulmonary edema   MDM  Nursing notes including past medical history and social history reviewed and considered in documentation xrays reviewed and considered Labs/vital reviewed and considered Previous records reviewed and considered - pt with h/o COPD        Date: 01/25/2013  Rate: 106  Rhythm: sinus tachycardia  QRS Axis: normal  Intervals: normal  ST/T Wave abnormalities: nonspecific ST changes  Conduction Disutrbances:none  Narrative Interpretation:   Old EKG Reviewed: unchanged    Joya Gaskins, MD 01/25/13 2353

## 2013-01-25 NOTE — ED Notes (Signed)
Pt reports since Friday she has been getting dizzy spells every time she stands. Pt reports some of theose times she thinks she has "blacked out". Pt reports she hit her head when she fell in the bath tub on Sunday. Pt c/o HA that started on Friday. Pt also c/o lower abd pain, started "a while ago", pt couldn't give extact time day/week. Pt reports she has been having chills. Pt reports she been having a fever on and off all week, reports highest temp was 102. Pt c/o SOB, pt has hx of COPD and asthma and has been feeling this way for years. Pt denies wearing oxygen at home. Pt oxygen saturation were in 84-86% on room air, pt placed on 3L/min  and immediately improved, pt now has oxygen saturations of 95%. Pt skin warm and dry. Respirations equal, slightly labored. Pt reports she has had a cough "for some time", productive with yellow thick mucus.

## 2013-01-26 ENCOUNTER — Encounter (HOSPITAL_COMMUNITY): Payer: Self-pay | Admitting: Internal Medicine

## 2013-01-26 ENCOUNTER — Encounter (HOSPITAL_BASED_OUTPATIENT_CLINIC_OR_DEPARTMENT_OTHER): Payer: Medicare Other | Attending: General Surgery

## 2013-01-26 ENCOUNTER — Other Ambulatory Visit: Payer: Self-pay | Admitting: Internal Medicine

## 2013-01-26 DIAGNOSIS — R55 Syncope and collapse: Secondary | ICD-10-CM

## 2013-01-26 DIAGNOSIS — L97509 Non-pressure chronic ulcer of other part of unspecified foot with unspecified severity: Secondary | ICD-10-CM | POA: Diagnosis present

## 2013-01-26 DIAGNOSIS — I517 Cardiomegaly: Secondary | ICD-10-CM

## 2013-01-26 DIAGNOSIS — R609 Edema, unspecified: Secondary | ICD-10-CM

## 2013-01-26 DIAGNOSIS — E11621 Type 2 diabetes mellitus with foot ulcer: Secondary | ICD-10-CM | POA: Diagnosis present

## 2013-01-26 LAB — BASIC METABOLIC PANEL
BUN: 9 mg/dL (ref 6–23)
CO2: 30 mEq/L (ref 19–32)
Calcium: 8.4 mg/dL (ref 8.4–10.5)
Chloride: 92 mEq/L — ABNORMAL LOW (ref 96–112)
Creatinine, Ser: 0.92 mg/dL (ref 0.50–1.10)
GFR calc Af Amer: 81 mL/min — ABNORMAL LOW (ref >90)
GFR calc non Af Amer: 70 mL/min — ABNORMAL LOW (ref >90)
Glucose, Bld: 379 mg/dL — ABNORMAL HIGH (ref 70–99)
Potassium: 3.6 mEq/L (ref 3.5–5.1)
Sodium: 132 mEq/L — ABNORMAL LOW (ref 135–145)

## 2013-01-26 LAB — GLUCOSE, CAPILLARY
Glucose-Capillary: 291 mg/dL — ABNORMAL HIGH (ref 70–99)
Glucose-Capillary: 227 mg/dL — ABNORMAL HIGH (ref 70–99)
Glucose-Capillary: 221 mg/dL — ABNORMAL HIGH (ref 70–99)

## 2013-01-26 LAB — URINALYSIS, ROUTINE W REFLEX MICROSCOPIC
Bilirubin Urine: NEGATIVE
Glucose, UA: >1000 mg/dL — AB
Hgb urine dipstick: NEGATIVE
Ketones, ur: 15 mg/dL — AB
Leukocytes, UA: NEGATIVE
Nitrite: NEGATIVE
Protein, ur: NEGATIVE mg/dL
Specific Gravity, Urine: 1.033 — ABNORMAL HIGH (ref 1.005–1.030)
Urobilinogen, UA: 1 mg/dL (ref 0.0–1.0)
pH: 6 (ref 5.0–8.0)

## 2013-01-26 LAB — URINE MICROSCOPIC-ADD ON

## 2013-01-26 LAB — CBC
HCT: 37.3 % (ref 36.0–46.0)
Hemoglobin: 12.3 g/dL (ref 12.0–15.0)
MCH: 31.4 pg (ref 26.0–34.0)
MCHC: 33 g/dL (ref 30.0–36.0)
MCV: 95.2 fL (ref 78.0–100.0)
Platelets: 110 10*3/uL — ABNORMAL LOW (ref 150–400)
RBC: 3.92 MIL/uL (ref 3.87–5.11)
RDW: 14.1 % (ref 11.5–15.5)
WBC: 14.7 10*3/uL — ABNORMAL HIGH (ref 4.0–10.5)

## 2013-01-26 LAB — HEPATIC FUNCTION PANEL
ALT: 32 U/L (ref 0–35)
AST: 32 U/L (ref 0–37)
Albumin: 3.1 g/dL — ABNORMAL LOW (ref 3.5–5.2)
Alkaline Phosphatase: 94 U/L (ref 39–117)
Bilirubin, Direct: 0.3 mg/dL (ref 0.0–0.3)
Indirect Bilirubin: 0.5 mg/dL (ref 0.3–0.9)
Total Bilirubin: 0.8 mg/dL (ref 0.3–1.2)
Total Protein: 6.9 g/dL (ref 6.0–8.3)

## 2013-01-26 LAB — BLOOD GAS, ARTERIAL
Acid-Base Excess: 2.6 mmol/L — ABNORMAL HIGH (ref 0.0–2.0)
Bicarbonate: 26.7 mEq/L — ABNORMAL HIGH (ref 20.0–24.0)
Drawn by: 36529
O2 Content: 3 L/min
O2 Saturation: 95 %
Patient temperature: 98.6
TCO2: 28 mmol/L (ref 0–100)
pCO2 arterial: 41.8 mmHg (ref 35.0–45.0)
pH, Arterial: 7.421 (ref 7.350–7.450)
pO2, Arterial: 71.8 mmHg — ABNORMAL LOW (ref 80.0–100.0)

## 2013-01-26 LAB — VITAMIN B12: Vitamin B-12: 614 pg/mL (ref 211–911)

## 2013-01-26 LAB — HEMOGLOBIN A1C
Hgb A1c MFr Bld: 9.5 % — ABNORMAL HIGH (ref <5.7)
Mean Plasma Glucose: 226 mg/dL — ABNORMAL HIGH (ref <117)

## 2013-01-26 LAB — SEDIMENTATION RATE: Sed Rate: 65 mm/hr — ABNORMAL HIGH (ref 0–22)

## 2013-01-26 LAB — RAPID URINE DRUG SCREEN, HOSP PERFORMED
Amphetamines: NOT DETECTED
Barbiturates: NOT DETECTED
Benzodiazepines: NOT DETECTED
Cocaine: NOT DETECTED
Opiates: NOT DETECTED
Tetrahydrocannabinol: NOT DETECTED

## 2013-01-26 LAB — LACTIC ACID, PLASMA: Lactic Acid, Venous: 1.9 mmol/L (ref 0.5–2.2)

## 2013-01-26 LAB — TSH: TSH: 0.815 u[IU]/mL (ref 0.350–4.500)

## 2013-01-26 LAB — PRO B NATRIURETIC PEPTIDE: Pro B Natriuretic peptide (BNP): 727.3 pg/mL — ABNORMAL HIGH (ref 0–125)

## 2013-01-26 LAB — TROPONIN I: Troponin I: <0.3 ng/mL (ref <0.30)

## 2013-01-26 LAB — HIV ANTIBODY (ROUTINE TESTING W REFLEX): HIV: NONREACTIVE

## 2013-01-26 MED ORDER — ATENOLOL 100 MG PO TABS
100.0000 mg | ORAL_TABLET | Freq: Every day | ORAL | Status: DC
  Administered 2013-01-26 – 2013-02-01 (×7): 100 mg via ORAL
  Filled 2013-01-26 (×7): qty 1

## 2013-01-26 MED ORDER — ONDANSETRON HCL 4 MG PO TABS: 4.0000 mg | ORAL_TABLET | Freq: Four times a day (QID) | ORAL | Status: DC | PRN

## 2013-01-26 MED ORDER — DOXYCYCLINE HYCLATE 100 MG PO TABS
100.0000 mg | ORAL_TABLET | Freq: Two times a day (BID) | ORAL | Status: DC
  Administered 2013-01-26 (×2): 100 mg via ORAL
  Filled 2013-01-26 (×3): qty 1

## 2013-01-26 MED ORDER — HYDROCHLOROTHIAZIDE 25 MG PO TABS
25.0000 mg | ORAL_TABLET | Freq: Every day | ORAL | Status: DC
  Administered 2013-01-26 – 2013-02-01 (×7): 25 mg via ORAL
  Filled 2013-01-26 (×7): qty 1

## 2013-01-26 MED ORDER — SODIUM CHLORIDE 0.9 % IV SOLN: 250.0000 mL | INTRAVENOUS | Status: DC | PRN

## 2013-01-26 MED ORDER — CLOPIDOGREL BISULFATE 75 MG PO TABS
75.0000 mg | ORAL_TABLET | Freq: Every day | ORAL | Status: DC
  Administered 2013-01-26 – 2013-02-01 (×7): 75 mg via ORAL
  Filled 2013-01-26 (×7): qty 1

## 2013-01-26 MED ORDER — VANCOMYCIN HCL IN DEXTROSE 1-5 GM/200ML-% IV SOLN
1000.0000 mg | Freq: Once | INTRAVENOUS | Status: AC
  Administered 2013-01-26: 1000 mg via INTRAVENOUS
  Filled 2013-01-26: qty 200

## 2013-01-26 MED ORDER — ACETAMINOPHEN 650 MG RE SUPP: 650.0000 mg | Freq: Four times a day (QID) | RECTAL | Status: DC | PRN

## 2013-01-26 MED ORDER — VANCOMYCIN HCL 10 G IV SOLR
2500.0000 mg | Freq: Once | INTRAVENOUS | Status: AC
  Administered 2013-01-26: 2500 mg via INTRAVENOUS
  Filled 2013-01-26: qty 2500

## 2013-01-26 MED ORDER — HEPARIN SODIUM (PORCINE) 5000 UNIT/ML IJ SOLN
5000.0000 [IU] | Freq: Three times a day (TID) | INTRAMUSCULAR | Status: DC
  Administered 2013-01-26 – 2013-02-01 (×19): 5000 [IU] via SUBCUTANEOUS
  Filled 2013-01-26 (×22): qty 1

## 2013-01-26 MED ORDER — ESCITALOPRAM OXALATE 20 MG PO TABS
20.0000 mg | ORAL_TABLET | Freq: Every day | ORAL | Status: DC
  Administered 2013-01-26 – 2013-02-01 (×7): 20 mg via ORAL
  Filled 2013-01-26 (×7): qty 1

## 2013-01-26 MED ORDER — ONDANSETRON HCL 4 MG/2ML IJ SOLN: 4.0000 mg | Freq: Four times a day (QID) | INTRAMUSCULAR | Status: DC | PRN

## 2013-01-26 MED ORDER — NICOTINE 14 MG/24HR TD PT24
14.0000 mg | MEDICATED_PATCH | TRANSDERMAL | Status: DC
Start: 2013-01-26 — End: 2013-02-01
  Administered 2013-01-26 – 2013-02-01 (×7): 14 mg via TRANSDERMAL
  Filled 2013-01-26 (×8): qty 1

## 2013-01-26 MED ORDER — POTASSIUM CHLORIDE CRYS ER 20 MEQ PO TBCR
40.0000 meq | EXTENDED_RELEASE_TABLET | Freq: Two times a day (BID) | ORAL | Status: AC
  Administered 2013-01-26 – 2013-01-27 (×4): 40 meq via ORAL
  Filled 2013-01-26 (×5): qty 2

## 2013-01-26 MED ORDER — ASPIRIN 81 MG PO CHEW
81.0000 mg | CHEWABLE_TABLET | Freq: Every day | ORAL | Status: DC
  Administered 2013-01-26 – 2013-02-01 (×7): 81 mg via ORAL
  Filled 2013-01-26 (×7): qty 1

## 2013-01-26 MED ORDER — INSULIN ASPART 100 UNIT/ML ~~LOC~~ SOLN
0.0000 [IU] | Freq: Every day | SUBCUTANEOUS | Status: DC
  Administered 2013-01-26 – 2013-01-27 (×2): 2 [IU] via SUBCUTANEOUS
  Administered 2013-01-28: 4 [IU] via SUBCUTANEOUS
  Administered 2013-01-29 – 2013-01-31 (×2): 3 [IU] via SUBCUTANEOUS

## 2013-01-26 MED ORDER — SODIUM CHLORIDE 0.9 % IJ SOLN
3.0000 mL | Freq: Two times a day (BID) | INTRAMUSCULAR | Status: DC
  Administered 2013-01-26 – 2013-01-31 (×7): 3 mL via INTRAVENOUS

## 2013-01-26 MED ORDER — POTASSIUM CHLORIDE CRYS ER 20 MEQ PO TBCR: 40.0000 meq | EXTENDED_RELEASE_TABLET | Freq: Two times a day (BID) | ORAL | Status: DC

## 2013-01-26 MED ORDER — ALBUTEROL SULFATE HFA 108 (90 BASE) MCG/ACT IN AERS: 2.0000 | INHALATION_SPRAY | Freq: Four times a day (QID) | RESPIRATORY_TRACT | Status: DC | PRN

## 2013-01-26 MED ORDER — SODIUM CHLORIDE 0.9 % IJ SOLN: 3.0000 mL | INTRAMUSCULAR | Status: DC | PRN

## 2013-01-26 MED ORDER — PREDNISONE 50 MG PO TABS
60.0000 mg | ORAL_TABLET | Freq: Every day | ORAL | Status: DC
  Administered 2013-01-26 – 2013-02-01 (×7): 60 mg via ORAL
  Filled 2013-01-26 (×9): qty 1

## 2013-01-26 MED ORDER — ACETAMINOPHEN 325 MG PO TABS: 650.0000 mg | ORAL_TABLET | Freq: Four times a day (QID) | ORAL | Status: DC | PRN

## 2013-01-26 MED ORDER — PREGABALIN 100 MG PO CAPS
100.0000 mg | ORAL_CAPSULE | Freq: Three times a day (TID) | ORAL | Status: DC
  Administered 2013-01-26 – 2013-02-01 (×16): 100 mg via ORAL
  Filled 2013-01-26 (×16): qty 1

## 2013-01-26 MED ORDER — OXYCODONE HCL 5 MG PO TABS
5.0000 mg | ORAL_TABLET | Freq: Four times a day (QID) | ORAL | Status: DC | PRN
  Administered 2013-01-26 – 2013-01-28 (×5): 5 mg via ORAL
  Filled 2013-01-26 (×5): qty 1

## 2013-01-26 MED ORDER — IPRATROPIUM BROMIDE 0.02 % IN SOLN
0.5000 mg | Freq: Four times a day (QID) | RESPIRATORY_TRACT | Status: DC
  Administered 2013-01-26 – 2013-01-28 (×9): 0.5 mg via RESPIRATORY_TRACT
  Filled 2013-01-26 (×12): qty 2.5

## 2013-01-26 MED ORDER — FUROSEMIDE 10 MG/ML IJ SOLN
40.0000 mg | Freq: Every day | INTRAMUSCULAR | Status: DC
  Administered 2013-01-26: 40 mg via INTRAVENOUS
  Filled 2013-01-26 (×2): qty 4

## 2013-01-26 MED ORDER — INSULIN ASPART PROT & ASPART (70-30 MIX) 100 UNIT/ML ~~LOC~~ SUSP
65.0000 [IU] | Freq: Every day | SUBCUTANEOUS | Status: DC
  Administered 2013-01-26 – 2013-01-28 (×3): 65 [IU] via SUBCUTANEOUS

## 2013-01-26 MED ORDER — INSULIN ASPART PROT & ASPART (70-30 MIX) 100 UNIT/ML ~~LOC~~ SUSP: 25.0000 [IU] | Freq: Two times a day (BID) | SUBCUTANEOUS | Status: DC

## 2013-01-26 MED ORDER — PANTOPRAZOLE SODIUM 40 MG PO TBEC
40.0000 mg | DELAYED_RELEASE_TABLET | Freq: Every day | ORAL | Status: DC
  Administered 2013-01-26 – 2013-01-28 (×3): 40 mg via ORAL
  Filled 2013-01-26 (×3): qty 1

## 2013-01-26 MED ORDER — SODIUM CHLORIDE 0.9 % IJ SOLN: 3.0000 mL | Freq: Two times a day (BID) | INTRAMUSCULAR | Status: DC

## 2013-01-26 MED ORDER — MOMETASONE FURO-FORMOTEROL FUM 100-5 MCG/ACT IN AERO
2.0000 | INHALATION_SPRAY | Freq: Two times a day (BID) | RESPIRATORY_TRACT | Status: DC
  Administered 2013-01-27 – 2013-02-01 (×10): 2 via RESPIRATORY_TRACT
  Filled 2013-01-26 (×2): qty 8.8

## 2013-01-26 MED ORDER — SIMVASTATIN 20 MG PO TABS
20.0000 mg | ORAL_TABLET | Freq: Every day | ORAL | Status: DC
  Administered 2013-01-26 – 2013-02-01 (×7): 20 mg via ORAL
  Filled 2013-01-26 (×10): qty 1

## 2013-01-26 MED ORDER — LEVALBUTEROL HCL 0.63 MG/3ML IN NEBU
0.6300 mg | INHALATION_SOLUTION | Freq: Four times a day (QID) | RESPIRATORY_TRACT | Status: DC
  Administered 2013-01-26 – 2013-01-28 (×9): 0.63 mg via RESPIRATORY_TRACT
  Filled 2013-01-26 (×14): qty 3

## 2013-01-26 MED ORDER — INSULIN ASPART PROT & ASPART (70-30 MIX) 100 UNIT/ML ~~LOC~~ SUSP
125.0000 [IU] | Freq: Every day | SUBCUTANEOUS | Status: DC
  Administered 2013-01-26 – 2013-01-29 (×4): 125 [IU] via SUBCUTANEOUS
  Filled 2013-01-26: qty 10

## 2013-01-26 MED ORDER — VANCOMYCIN HCL IN DEXTROSE 1-5 GM/200ML-% IV SOLN
1000.0000 mg | Freq: Two times a day (BID) | INTRAVENOUS | Status: DC
  Administered 2013-01-27 – 2013-01-30 (×6): 1000 mg via INTRAVENOUS
  Filled 2013-01-26 (×8): qty 200

## 2013-01-26 MED ORDER — INSULIN ASPART 100 UNIT/ML ~~LOC~~ SOLN
0.0000 [IU] | Freq: Three times a day (TID) | SUBCUTANEOUS | Status: DC
  Administered 2013-01-27: 3 [IU] via SUBCUTANEOUS
  Administered 2013-01-27: 5 [IU] via SUBCUTANEOUS
  Administered 2013-01-27: 15 [IU] via SUBCUTANEOUS
  Administered 2013-01-28: 8 [IU] via SUBCUTANEOUS
  Administered 2013-01-28: 5 [IU] via SUBCUTANEOUS
  Administered 2013-01-28: 3 [IU] via SUBCUTANEOUS
  Administered 2013-01-29: 15 [IU] via SUBCUTANEOUS
  Administered 2013-01-30: 3 [IU] via SUBCUTANEOUS
  Administered 2013-01-30: 5 [IU] via SUBCUTANEOUS
  Administered 2013-01-31: 8 [IU] via SUBCUTANEOUS
  Administered 2013-02-01: 3 [IU] via SUBCUTANEOUS
  Administered 2013-02-01: 8 [IU] via SUBCUTANEOUS
  Administered 2013-02-01: 2 [IU] via SUBCUTANEOUS

## 2013-01-26 MED ORDER — BENAZEPRIL HCL 40 MG PO TABS
40.0000 mg | ORAL_TABLET | Freq: Every day | ORAL | Status: DC
  Administered 2013-01-26 – 2013-02-01 (×7): 40 mg via ORAL
  Filled 2013-01-26 (×7): qty 1

## 2013-01-26 NOTE — Telephone Encounter (Signed)
Pt was admitted. Need to make certain meds are not D/C'd during this admission. Will wait until D/C - she can request refill then

## 2013-01-26 NOTE — Progress Notes (Signed)
*  PRELIMINARY RESULTS* Vascular Ultrasound Carotid Doppler= Bilateral:  No evidence of hemodynamically significant internal carotid artery stenosis.   Vertebral artery flow is antegrade.    Lower Extremity Venous Doppler =Bilateral:  No evidence of DVT, superficial thrombosis, or Baker's Cyst.  Veda Canning, RDCS Florestine Avers, RVT Farrel Demark, RDMS, RVT 01/26/2013, 1:47 PM

## 2013-01-26 NOTE — Consult Note (Signed)
WOC consult Note Reason for Consult: evaluate plantar surface wounds. Pt with hx of wounds plantar surface, unclear of how long they have been present.  Was to be seen in Wound care center today per Dr. Jimmey Ralph. Wound type:neuropathic foot ulcer left plantar foot, neuropathic foot ulcer with associated cellulitis of the right foot Measurement: Left foot: 3cm x 6cm x 0cm  Right foot ulcer: 6cm x 8cm x 0.2 at wound edge Wound bed: Left foot: 90% hard eschar, appears to have unroofed blister here that has dried to eschar Right foot: appears to have large blistered area with surrounding erythema that has not improved. Bulla roof intact, not able to probe under this area but does have some darkening tissue surrounding this area also. Drainage (amount, consistency, odor) no drainage from either site. Periwound:see above Dressing procedure/placement/frequency:no topical care at this time, however do recommend orthopedic evaluation for the right foot and possibly for the left foot. Both appear to need debridement and for successful wound healing offloading.  Feel that left foot may be ok to manage on outpt basis if needed.  Re consult if needed, will not follow at this time. Thanks  Grisell Bissette [employer], CWOCN [phone])

## 2013-01-26 NOTE — Progress Notes (Signed)
ANTIBIOTIC CONSULT NOTE - INITIAL  Pharmacy Consult for Vancomycin Indication:  Gram positive sepsis  Allergies  Allergen Reactions  . 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 Morphine Sulfate     REACTION: hives  . Vicodin (Hydrocodone-Acetaminophen) Itching    Patient Measurements: Height: 5\' 7"  (170.2 cm) Weight: 263 lb 12.8 oz (119.659 kg) IBW/kg (Calculated) : 61.6   Vital Signs: Temp: 98 F (36.7 C) (02/05 1434) Temp src: Oral (02/05 2020) BP: 149/110 mmHg (02/05 2020) Pulse Rate: 84  (02/05 2020) Intake/Output from previous day:   Intake/Output from this shift:    Labs:  Basename 01/26/13 0451 01/25/13 2142  WBC 14.7* 12.2*  HGB 12.3 13.6  PLT 110* 113*  LABCREA -- --  CREATININE 0.92 0.78   Estimated Creatinine Clearance: 94.7 ml/min (by C-G formula based on Cr of 0.92). No results found for this basename: VANCOTROUGH:2,VANCOPEAK:2,VANCORANDOM:2,GENTTROUGH:2,GENTPEAK:2,GENTRANDOM:2,TOBRATROUGH:2,TOBRAPEAK:2,TOBRARND:2,AMIKACINPEAK:2,AMIKACINTROU:2,AMIKACIN:2, in the last 72 hours   Microbiology: Recent Results (from the past 720 hour(s))  CULTURE, BLOOD (ROUTINE X 2)     Status: Normal (Preliminary result)   Collection Time   01/26/13 12:30 AM      Component Value Range Status Comment   Specimen Description BLOOD ARM LEFT   Final    Special Requests BOTTLES DRAWN AEROBIC AND ANAEROBIC 10CC   Final    Culture  Setup Time 01/26/2013 03:50   Final    Culture     Final    Value: GRAM POSITIVE COCCI IN PAIRS     Note: Gram Stain Report Called to,Read Back By and Verified With: CHRISTY JOHNSON ON 01/26/2013 AT 6:50P BY WILEJ   Report Status PENDING   Incomplete   CULTURE, BLOOD (ROUTINE X 2)     Status: Normal (Preliminary result)   Collection Time   01/26/13 12:45 AM      Component Value Range Status Comment   Specimen Description BLOOD HAND RIGHT   Final    Special Requests BOTTLES DRAWN AEROBIC AND  ANAEROBIC 10CC   Final    Culture  Setup Time 01/26/2013 03:50   Final    Culture     Final    Value: GRAM POSITIVE COCCI IN PAIRS     Note: Gram Stain Report Called to,Read Back By and Verified With: CHRISTY JOHNSON ON 01/26/2013 AT 6:50P BY WILEJ   Report Status PENDING   Incomplete     Medical History: Past Medical History  Diagnosis Date  . Depression   . Diabetes mellitus   . GERD (gastroesophageal reflux disease)   . Hyperlipidemia   . Hypertension   . Chronic kidney disease   . Glaucoma   . COPD (chronic obstructive pulmonary disease)   . Chronic shoulder pain   . Chronic back pain   . PVD (peripheral vascular disease) with claudication     Stents to bilateral common iliac arteries, on chronic plavix  . Tinnitus   . Peripheral neuropathy   . Fibromyalgia   . Colon polyp   . Diverticula of colon   . Diverticulosis   . Asthma     Medications:  Prescriptions prior to admission  Medication Sig Dispense Refill  . ADVAIR DISKUS 250-50 MCG/DOSE AEPB INHALE 1 PUFF INTO THE LUNG TWICE A DAY  60 each  2  . albuterol (PROVENTIL HFA;VENTOLIN HFA) 108 (90 BASE) MCG/ACT inhaler Inhale 2 puffs into the lungs every 6 (six) hours as needed. For breathing      .  aspirin 81 MG tablet Take 81 mg by mouth daily.        Marland Kitchen atenolol (TENORMIN) 100 MG tablet TAKE 1 TABLET (100 MG TOTAL) BY MOUTH DAILY.  30 tablet  9  . baclofen (LIORESAL) 10 MG tablet TAKE 1 TABLET THREE TIMES A DAY  90 tablet  1  . benazepril (LOTENSIN) 40 MG tablet TAKE 1 TABLET BY MOUTH EVERY DAY  30 tablet  3  . clopidogrel (PLAVIX) 75 MG tablet Take 1 tablet (75 mg total) by mouth daily.  30 tablet  11  . diclofenac sodium (VOLTAREN) 1 % GEL Apply 2 g topically 3 (three) times daily. To abdomen  100 g  0  . DULoxetine (CYMBALTA) 30 MG capsule Take 1 capsule (30 mg total) by mouth daily.  30 capsule  2  . escitalopram (LEXAPRO) 20 MG tablet Take 1 tablet (20 mg total) by mouth daily.  30 tablet  6  . fluticasone  (FLONASE) 50 MCG/ACT nasal spray Place 1 spray into the nose 2 (two) times daily.  16 g  2  . hydrochlorothiazide (HYDRODIURIL) 25 MG tablet TAKE 1 TABLET (25 MG TOTAL) BY MOUTH DAILY.  90 tablet  1  . insulin lispro protamine-insulin lispro (HUMALOG 75/25) (75-25) 100 UNIT/ML SUSP Inject 65-125 Units into the skin 2 (two) times daily with a meal. Use 125 units every morning and use 65 units every evening      . LYRICA 100 MG capsule TAKE 2 CAPSULES BY MOUTH 3 TIMES DAILY  160 capsule  3  . metFORMIN (GLUCOPHAGE) 1000 MG tablet Take 1 tablet (1,000 mg total) by mouth 2 (two) times daily with a meal.  60 tablet  11  . NEXIUM 20 MG capsule TAKE 1 CAPSULE (20 MG TOTAL) BY MOUTH DAILY.  30 capsule  4  . pravastatin (PRAVACHOL) 40 MG tablet Take 1 tablet (40 mg total) by mouth every evening.  30 tablet  11   Admit Complaint: 52 y.o.  female  admitted 01/25/2013 with frequent falls at home worsening diabetic wounds and blood cultures growing 2/2 GPC.  Pharmacy consulted to dose vancomycin.  Assessment:  Infectious Disease: GP sepsis, afebrile, WBC 14.7 Antibiotics: 2/5 vancomycin Cultures: 2/5 Blood 2/2 GPC  Best Practices: DVT Prophylaxis:  SQ heparin   Goal of Therapy:  Vancomycin trough level 15-20 mcg/ml  Plan:  1. Vancomycin 2500 mg IV x1 now 2. Vancomycin 1000 mg IV q12h 3. Follow up SCr, UOP, cultures, clinical course and adjust as clinically indicated.    Thank you for allowing pharmacy to be a part of this patients care team.  Lovenia Kim Pharm.D., BCPS Clinical Pharmacist 01/26/2013 8:32 PM Pager: [phone] Phone: [phone]

## 2013-01-26 NOTE — Progress Notes (Signed)
PT Cancellation Note  Patient Details Name: Jennifer [name] MRN: [id number] DOB: [birth year]   Cancelled Treatment:    Reason Eval/Treat Not Completed: Other (comment) (Refused questioning (should know already) and further intervention today.   Dot Splinter, Eliseo Gum 01/26/2013, 4:37 PM

## 2013-01-26 NOTE — H&P (Signed)
Internal Medicine Teaching Service Attending Note Date: 01/26/2013  Patient name: Jennifer [name]  Medical record number: [id number]  Date of birth: 07-01-1961    This patient has been seen and discussed with the house staff. Please see their note for complete details. I concur with their findings with the following additions/corrections:  Not clear what the cause of her falls is although it may be multifactorial we are working this up as above. With regard to her feet, clear evidence of peripheral vascular disease bilaterally. The right foot has an open wound but apparently had drained drill material from it. This was not present when I examined the wound foot itself was edematous. Left foot was with a large eschar on the base again consistent with peripheral vascular disease.  Concerned the patient may have deeper infection and the right foot if not the left and that she would benefit from an MRI to evaluate for deep infection, abscess or osteomyelitis.  We'll discontinue antibiotics for now to maximize yielded any cultures that might be obtained should she have an abscess that would be intervened upon.  Paulette Blanch Dam 01/26/2013, 2:28 PM

## 2013-01-26 NOTE — ED Notes (Signed)
Lab at bedside drawing labs

## 2013-01-26 NOTE — Progress Notes (Signed)
Inpatient Diabetes Program Recommendations  AACE/ADA: New Consensus Statement on Inpatient Glycemic Control (2013)  Target Ranges:  Prepandial:   less than 140 mg/dL      Peak postprandial:   less than 180 mg/dL (1-2 hours)      Critically ill patients:  140 - 180 mg/dL   Reason for Visit: Elevated HgbA1C of 9.5% Noted Jennifer [name] has seen and will follow as out-patient in Int Med.  Inpatient Diabetes Program Recommendations Correction (SSI): No correction ordered. Pt only on home dose of 70/30. While on  Prednisone at high dose, please add correciton -Moderate tidwc and the HS scale.  Note: Thank you, Lenor Coffin, RN, CNS, Diabetes Coordinator [phone])

## 2013-01-26 NOTE — Progress Notes (Signed)
Visit to patient while in hospital. Will call after she is discharged to assist with transition of care. 

## 2013-01-26 NOTE — H&P (Signed)
Hospital Admission Note Date: 01/26/2013  Patient name: Jennifer [name] Medical record number: [id number] Date of birth: 1961/07/21 Age: 52 y.o. Gender: female PCP: Blanch Media, MD  Medical Service: [facility]  Attending physician:  Dr. Daiva Eves   1st Contact: Dr. Burtis Junes   Pager:[phone] 2nd Contact: Dr. Dierdre Searles    Pager:[phone] After 5 pm or weekends: 1st Contact:  Intern on call   Pager: [phone] 2nd Contact:  Resident on call  Pager: [phone]  Chief Complaint: falls  History of Present Illness: Jennifer [name] is a 52 yo lady with pmh of T2DM complicated by foot ulcers and neuropathy, HTN, PVD s/p stenting, COPD, CKD and fibromyalgia presenting with chief complaint of falls since 01/21/13. Jennifer [name] reports that she was in her usual state of health until last Friday, when she began falling in her home. She says that she has fallen several times a day since Friday, and has lost count. Falls occur when she stands from a seated position. She reports feeling primarily unsteady on her legs and weak. She has fallen onto her head and side, denies sustaining any cuts or bruises. She reports a sensation of dizziness preceding falls. She alternately reports falling with complete awareness of the episodes as well as falls where she "blacks out" and awakens on the floor. No sensation of room spinning. No palpitations. No symptoms at rest. Symptoms are not provoked by rotational movements of her head. She feels generally weaker but denies focal weakness/numbness. No blurry vision, clouded vision, or monocular vision loss. Has history of tinnitus which she says is somewhat improved than in the past. At home she ambulates with walker at baseline due to DM peripheral neuropathy. Reports concomitant frontal headache which extends to bilateral temples. Cannot remember if HA preceded falls or not.  One episode of nausea and nonbloodly, nonbilious emesis.  She says that on Friday, her son told her that her right foot  looked "red and swollen." Patient has chronic diabetic ulcers for which she follows at wound care. She says that her R ankle is now more erythematous and edematous than usual. Both feet are painful to ambulate on.   Over the past few days, has noticed worsening of her baseline chronic cough as well as SOB. Now with new yellow/green phlegm. Some chills.  Denies chest pain, chest pressure, photophobia, dysuria, hematuria, abdominal pain, diarrhea, constipation, melena, hematochezia.   Meds: Current Outpatient Rx  Name  Route  Sig  Dispense  Refill  . ADVAIR DISKUS 250-50 MCG/DOSE IN AEPB      INHALE 1 PUFF INTO THE LUNG TWICE A DAY   60 each   2   . ALBUTEROL SULFATE HFA 108 (90 BASE) MCG/ACT IN AERS   Inhalation   Inhale 2 puffs into the lungs every 6 (six) hours as needed. For breathing         . ASPIRIN 81 MG PO TABS   Oral   Take 81 mg by mouth daily.           . ATENOLOL 100 MG PO TABS      TAKE 1 TABLET (100 MG TOTAL) BY MOUTH DAILY.   30 tablet   9   . BACLOFEN 10 MG PO TABS      TAKE 1 TABLET THREE TIMES A DAY   90 tablet   1   . BENAZEPRIL HCL 40 MG PO TABS      TAKE 1 TABLET BY MOUTH EVERY DAY   30 tablet   3   .  CLOPIDOGREL BISULFATE 75 MG PO TABS   Oral   Take 1 tablet (75 mg total) by mouth daily.   30 tablet   11   . DICLOFENAC SODIUM 1 % TD GEL   Topical   Apply 2 g topically 3 (three) times daily. To abdomen   100 g   0   . DULOXETINE HCL 30 MG PO CPEP   Oral   Take 1 capsule (30 mg total) by mouth daily.   30 capsule   2   . ESCITALOPRAM OXALATE 20 MG PO TABS   Oral   Take 1 tablet (20 mg total) by mouth daily.   30 tablet   6   . FLUTICASONE PROPIONATE 50 MCG/ACT NA SUSP   Nasal   Place 1 spray into the nose 2 (two) times daily.   16 g   2   . HYDROCHLOROTHIAZIDE 25 MG PO TABS      TAKE 1 TABLET (25 MG TOTAL) BY MOUTH DAILY.   90 tablet   1   . INSULIN LISPRO PROT & LISPRO (75-25) 100 UNIT/ML Grubbs SUSP   Subcutaneous    Inject 65-125 Units into the skin 2 (two) times daily with a meal. Use 125 units every morning and use 65 units every evening         . LYRICA 100 MG PO CAPS      TAKE 2 CAPSULES BY MOUTH 3 TIMES DAILY   160 capsule   3   . METFORMIN HCL 1000 MG PO TABS   Oral   Take 1 tablet (1,000 mg total) by mouth 2 (two) times daily with a meal.   60 tablet   11   . NEXIUM 20 MG PO CPDR      TAKE 1 CAPSULE (20 MG TOTAL) BY MOUTH DAILY.   30 capsule   4   . PRAVASTATIN SODIUM 40 MG PO TABS   Oral   Take 1 tablet (40 mg total) by mouth every evening.   30 tablet   11     Allergies: Allergies as of 01/25/2013 - Review Complete 01/25/2013  Allergen Reaction Noted  . Iohexol  08/17/2007  . Morphine sulfate    . Vicodin (hydrocodone-acetaminophen) Itching 02/24/2012   Past Medical History  Diagnosis Date  . Depression   . Diabetes mellitus   . GERD (gastroesophageal reflux disease)   . Hyperlipidemia   . Hypertension   . Chronic kidney disease   . Glaucoma   . COPD (chronic obstructive pulmonary disease)   . Chronic shoulder pain   . Chronic back pain   . PVD (peripheral vascular disease) with claudication     Stents to bilateral common iliac arteries, on chronic plavix  . Tinnitus   . Peripheral neuropathy   . Fibromyalgia   . Colon polyp   . Diverticula of colon   . Diverticulosis   . Asthma    Past Surgical History  Procedure Date  . Abdominal hysterectomy   . Tubal ligation   . Rotator cuff repair     bilateral  . Breast biopsy     multiple-benign per pt  . Wrist surgery     right  . Ganglion cyst excision     multiple  . Other surgical history     stents in lower ext   History reviewed. No pertinent family history. History   Social History  . Marital Status: Single    Spouse Name: N/A    Number of  Children: N/A  . Years of Education: N/A   Occupational History  . Not on file.   Social History Main Topics  . Smoking status: Current Every Day  Smoker -- 1.5 packs/day for 40 years    Types: Cigarettes  . Smokeless tobacco: Current User    Types: Snuff     Comment: Half pack or less.  . Alcohol Use: No  . Drug Use: No  . Sexually Active: Not on file   Other Topics Concern  . Not on file   Social History Narrative   On disability. Lives with son in [place]. 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     Review of Systems: 10 pt ROS performed, pertinent positives and negatives noted in HPI  Physical Exam: Blood pressure 128/69, pulse 111, temperature 99.7 F (37.6 C), temperature source Oral, resp. rate 8, SpO2 95.00%. Vitals reviewed. General: Obese female resting in bed, some increased WOB but otherwise not in distress HEENT: Dry MM of OP with dry, brown tongue. PERRL, EOMI, no scleral icterus. TM pearly bilaterally. Scalp even with no lacerations or swelling. No tenderness to palpation of R or L temporal scalp Neck: No nuchal rigidity Cardiac: Tachycardic 100-110 with regular rate, no rubs, murmurs or gallops Pulm: slightly increased WOB but good air movement bilateral lung fields, bilateral soft end-expiratory wheezes Abd: Induration of subcutaneous tissues of bilateral lower quadrants at site of insulin injection, non erythematous and no ecchymoses visualized; otherwise abdomen benignnontender, nondistended, normoactive BS Ext: R foot and ankle with 2+ pitting edema and erythema. 6x6 cm wound on medial R plantar surface with thick layer of pale, insensate skin and no active drainage. L foot with partial thickness 4x4cm ulcer on medial plantar surface. R leg with 1+ pitting edema to knee. Bilateral xerosis and hyperpigmentation of lower extremities. 2+ DP pulses bilaterally. No pain with dorsiflexion of R foot or calf squeeze.  Neuro: alert and oriented X3, cranial nerves II-XII grossly intact, strength 5/5 bilateral UE, LE and face. Diminished sensation to touch bilateral lower extremities Gait not assessed due to foot pain  Lab  results: Basic Metabolic Panel:  Basename 01/25/13 2142  NA 131*  K 3.0*  CL 91*  CO2 26  GLUCOSE 367*  BUN 9  CREATININE 0.78  CALCIUM 8.7  MG --  PHOS --   CBC:  Basename 01/25/13 2142  WBC 12.2*  NEUTROABS 9.7*  HGB 13.6  HCT 40.4  MCV 94.8  PLT 113*   Imaging results:  Ct Head Wo Contrast  01/26/2013  *RADIOLOGY REPORT*  Clinical Data:  Episode of passing out.  Headache.  Diabetic hypertensive patient with hyperlipidemia.  CT HEAD WITHOUT CONTRAST CT CERVICAL SPINE WITHOUT CONTRAST  Technique:  Multidetector CT imaging of the head and cervical spine was performed following the standard protocol without intravenous contrast.  Multiplanar CT image reconstructions of the cervical spine were also generated.  Comparison:  08/18/2007 MR brain.  CT HEAD  Findings: No skull fracture or intracranial hemorrhage.  No hydrocephalus.  No CT evidence of large acute infarct.  Scattered mild nonspecific white matter type changes.  Exophthalmos.  Mastoid air cells, middle ear cavities and visualized paranasal sinuses are clear.  IMPRESSION: No acute abnormality.  Please see above.  CT CERVICAL SPINE  Findings: Evaluation limited by patient's habitus.  No cervical spine fracture or malalignment is noted.  Carotid artery calcifications.  Heterogeneous slightly prominent appearance of the thyroid gland.  IMPRESSION: Evaluation limited by patient's habitus.  No cervical spine fracture or  malalignment is noted.  Carotid artery calcifications.  Heterogeneous slightly prominent appearance of the thyroid gland.   Original Report Authenticated By: Lacy Duverney, M.D.    Ct Cervical Spine Wo Contrast  01/26/2013  *RADIOLOGY REPORT*  Clinical Data:  Episode of passing out.  Headache.  Diabetic hypertensive patient with hyperlipidemia.  CT HEAD WITHOUT CONTRAST CT CERVICAL SPINE WITHOUT CONTRAST  Technique:  Multidetector CT imaging of the head and cervical spine was performed following the standard protocol  without intravenous contrast.  Multiplanar CT image reconstructions of the cervical spine were also generated.  Comparison:  08/18/2007 MR brain.  CT HEAD  Findings: No skull fracture or intracranial hemorrhage.  No hydrocephalus.  No CT evidence of large acute infarct.  Scattered mild nonspecific white matter type changes.  Exophthalmos.  Mastoid air cells, middle ear cavities and visualized paranasal sinuses are clear.  IMPRESSION: No acute abnormality.  Please see above.  CT CERVICAL SPINE  Findings: Evaluation limited by patient's habitus.  No cervical spine fracture or malalignment is noted.  Carotid artery calcifications.  Heterogeneous slightly prominent appearance of the thyroid gland.  IMPRESSION: Evaluation limited by patient's habitus.  No cervical spine fracture or malalignment is noted.  Carotid artery calcifications.  Heterogeneous slightly prominent appearance of the thyroid gland.   Original Report Authenticated By: Lacy Duverney, M.D.    Dg Chest Portable 1 View  01/25/2013  *RADIOLOGY REPORT*  Clinical Data: Loss of consciousness.  Severe shortness of breath. History of smoking, COPD, diabetes, hypertension.  PORTABLE CHEST - 1 VIEW  Comparison: 09/04/2011  Findings: The heart is mildly enlarged.  There are interstitial changes consistent with mild edema.  No focal consolidations or pleural effusions are identified.  IMPRESSION: Changes consistent with mild pulmonary edema.   Original Report Authenticated By: Norva Pavlov, M.D.     Other results: EKG: Sinus tachycardia w rate 106, mild LAD, normal intervals, no ST elevations/depressions or T-wave inversions. Compared to prior 08/26/12.  Assessment & Plan by Problem:  1) Falls Patient reports multiple falls at home since Friday. Occurs when she stands from a seated position at home. She describes sensations of imbalance when walking. Unclear if these episodes represent true syncope, as sometimes she says she has "black outs" and other  times describes being completely aware of falls while she is falling and when she hits the ground. No descriptions of visual changes. She does not experience a sensation of the room spinning, and symptoms are not brought on by rotational movement of head and neck. As a whole, her symptoms do not seem wholly consistent with a single diagnosis. Her presentation sounds very akin to orthostasis; however, she does not have orthostatic changes in vitals on examination. She has previously complained of tinnitus and hearing loss, but says these symptoms have improved. Symptoms are not consistent with BPPV. With advanced neuropathy, etiology of weakness and falls could be diabetic autonomic neuropathy. Posterior circulation pathology is also of concern with the sudden onset of symptoms; however, would expect her to have dizziness at rest if this were the etiology. Has headache but no focal neurological signs or meningismus. She is hyperglycemic with blood glucose of 367 on admission. No changes on EKG or palpitations to suggest arrhythmia. Not anemic. No hypoglycemia or hypercalcemia. Last TSH normal. Patient is on diuretic, ACE-i, and beta blocker, but has been on these medications chronically. Other medications that might cause symptoms include lyrica and baclofen. Reassuringly, CT of head was normal with exception of some visualized carotid  artery calcifications.  - Tele bed to monitory for any arrhythmia overnight - Carotid dopplers - Cycle CE - Echo - UDS -TSH, B12 - Holding lyrica - Consider MRI of head if concern for posterior circulation pathology - PT/OT vestibular eval - Hold lyrica, baclofen  2) R lower extremity swelling/redness She has erythematous, swollen R ankle with no history of antecedent trauma. Swelling of R leg below knee. May represent chronic venous stasis and stasis dermatitis. Possibly underlying cellulitis, but . She does not have pain with dorsiflexion or calf squeeze; however  asymmetry of legs could reflect underlying clot. Well's score is low (0-1). Chronic ulcers of bilateral feet for which she follows with wound care. Received vancomycin 1gm x1 in ED. - Hold vancomycin - Will give doxycycline 100mg  BID to cover for possible soft tissue infection, this will also cover for COPD exacerbation (#) - Wound care consult - L LE dopplers  3) COPD Patient with worsening of chronic cough and new yellow/green sputum production. 60 pack year smoking history. Concern for COPD exacerbation in setting of these symptoms and worsening SOB. Wheezing is very mild and she has good air movement. - Will treat with doxycycline BID - Xopenex as pt is tachycardic, ipratropium nebs - Mometasone-formoterol inhaler - No steroids for now  4) Pulmonary edema Patient appears very dry on exam but with peripheral edema and mild pulmonary edema. Last EF was 60-65% in 6/13.  Received 40mg  IV lasix in ED. No evidence of decompensated heart failure at this time - check pro-BNP - telemetry, cycle troponins - Repeat echo - lasix  5) Headache Patient with HA for several days, unclear if preceded or followed falls. No visual changes or pain on palpation of temporal artery. CT unremarkable. - Check ESR  6) Hyperglycemia in setting of T2DM Blood sugar is 367 on Bmet. Anion gap slightly elevated at 14 with normal bicarb. Small ketones in urine.  - Restart home insulin regimen with Novolog 125U qam and 65U qpm - SSI - Follow Bmet and AG - Diabetes educator consult  7) HTN Normotensive on admission. Takes atenolol, HCTZ, and benazepril at home. - Continue home antihypertensives.  8) Peripheral vascular disease Patient is s/p stenting to bilateral common iliac arteries in the past (2005, 2008). Last ABIs in 2010 wnl. - Continue plavix  Dispo: Disposition is deferred at this time, awaiting improvement of current medical problems. Anticipated discharge in approximately 2-3 day(s).   The  patient does have a current PCP Blanch Media, MD), therefore will be requiring OPC follow-up after discharge.   The patient does not have transportation limitations that hinder transportation to clinic appointments.  Signed: Bronson Curb 01/26/2013, 1:52 AM

## 2013-01-26 NOTE — Progress Notes (Signed)
Echocardiogram 2D Echocardiogram has been performed.  Isaac Lacson 01/26/2013, 1:16 PM

## 2013-01-27 ENCOUNTER — Inpatient Hospital Stay (HOSPITAL_COMMUNITY): Payer: Medicare Other

## 2013-01-27 ENCOUNTER — Other Ambulatory Visit: Payer: Self-pay | Admitting: *Deleted

## 2013-01-27 LAB — GLUCOSE, CAPILLARY
Glucose-Capillary: 369 mg/dL — ABNORMAL HIGH (ref 70–99)
Glucose-Capillary: 170 mg/dL — ABNORMAL HIGH (ref 70–99)
Glucose-Capillary: 226 mg/dL — ABNORMAL HIGH (ref 70–99)
Glucose-Capillary: 232 mg/dL — ABNORMAL HIGH (ref 70–99)

## 2013-01-27 LAB — HIV ANTIBODY (ROUTINE TESTING W REFLEX): HIV: NONREACTIVE

## 2013-01-27 LAB — SEDIMENTATION RATE: Sed Rate: 84 mm/hr — ABNORMAL HIGH (ref 0–22)

## 2013-01-27 LAB — C-REACTIVE PROTEIN: CRP: 31.2 mg/dL — ABNORMAL HIGH (ref <0.60)

## 2013-01-27 MED ORDER — DEXTROSE 5 % IV SOLN
2.0000 g | INTRAVENOUS | Status: DC
  Administered 2013-01-27: 2 g via INTRAVENOUS
  Filled 2013-01-27 (×2): qty 2

## 2013-01-27 MED ORDER — GADOBENATE DIMEGLUMINE 529 MG/ML IV SOLN
20.0000 mL | Freq: Once | INTRAVENOUS | Status: AC
  Administered 2013-01-27: 20 mL via INTRAVENOUS

## 2013-01-27 MED ORDER — BENZONATATE 100 MG PO CAPS
100.0000 mg | ORAL_CAPSULE | Freq: Two times a day (BID) | ORAL | Status: DC | PRN
  Administered 2013-01-27 – 2013-01-29 (×2): 100 mg via ORAL
  Filled 2013-01-27 (×4): qty 1

## 2013-01-27 NOTE — Progress Notes (Signed)
Internal Medicine Teaching Service Attending Note Date: 01/27/2013  Patient name: Jennifer [name]  Medical record number: [id number]  Date of birth: Dec 21, 1961    This patient has been seen and discussed with the house staff. Please see their note for complete details. I concur with their findings with the following additions/corrections:  Patient is getting ready to go down for MRI and we had discussion about her positive blood cultures. I have little doubt that the source of this bacteremia it is her foot infection.  We'll add 2 g of ceftriaxone daily to her IV vancomycin a repeat her blood cultures. Her 2-D echocardiogram failed to show any evidence of vegetations.  I consider getting a transesophageal echocardiogram to evaluate for bacterial endocarditis depending on the organism isolated from blood cultures. However if the patient has osteomyelitis a TEE would not likely alter management (duration of antibiotics) unless it showed a large vegetation that would   to push [place] towards a cardiothoracic surgical consultation.  Acey Lav 01/27/2013, 12:40 PM

## 2013-01-27 NOTE — Progress Notes (Signed)
Resident Addendum to Medical Student Note   I have seen and examined the patient, and agree with the the medical student assessment and plan outlined above. Please see my brief note on the same day for additional details.  Length of Stay: 2   Jennifer [name] 01/27/2013, 3:59 PM [phone]

## 2013-01-27 NOTE — Evaluation (Signed)
Physical Therapy Evaluation Patient Details Name: Jennifer [name] MRN: [id number] DOB: February 07, 1961 Today's Date: 01/27/2013 Time: 9528-4132 PT Time Calculation (min): 55 min  PT Assessment / Plan / Recommendation Clinical Impression  Pt s/p falls with vertigo with decr mobility due to significant dizziness with movement.  Vestibular testing suggestive of Left anterior canal cupulolithiasis therefore treated with Semont maneuver x2 and then Canalith repostioning left x 2.  Pt reported decr in symptoms after treatment.  Suspect that she has more than one canal involved at present but could not treat but one today due to fear of moving crystals the wrong way and pt fatigued after the maneuvers we had already done.      PT Assessment  Patient needs continued PT services    Follow Up Recommendations  SNF;Supervision/Assistance - 24 hour (for vestibular rehab)        Barriers to Discharge Decreased caregiver support      Equipment Recommendations  Rolling walker with 5" wheels         Frequency Min 3X/week    Precautions / Restrictions Precautions Precautions: Fall Restrictions Weight Bearing Restrictions: No   Pertinent Vitals/Pain VSS, some pain      Mobility  Bed Mobility Bed Mobility: Rolling Left;Rolling Right Rolling Right: 7: Independent Rolling Left: 7: Independent Details for Bed Mobility Assistance: Pt a 7-8/10 dizzyiness on arrival.  Noted L beating nystagmus with rolling left but not with rolling right.  Tested with Gilberto Better bil with +Left Hallpike and - Left Hallpike.  Noted left downbeating nystagmus which persisted for several minutes.  Performed Semont maneuver x2 for Anterior canal left BPPV.  Performed canalith repositioning maneuver x2 as well for L BPPV to try and get crystals out of canals.  After all treatment completed, pt was 4/10 dizziness.  This PT is suspicious of possibly further canal involvement but felt it best to check back with patient tomorrow and  let her head settle after all of this treatment.  Pt in agreement.  Pt aware not to lie flat and make no sudden head movements.   Transfers Transfers: Sit to Stand;Stand to Sit Sit to Stand: 3: Mod assist;With upper extremity assist;From bed Stand to Sit: 3: Mod assist;With upper extremity assist;With armrests;To chair/3-in-1 Details for Transfer Assistance: Needed cues for hand placement.  Pt moves quickly thus decreasing safety and has shakiness as well.  Therefore needed steadying assist upon standing.   Ambulation/Gait Ambulation/Gait Assistance: 3: Mod assist Ambulation Distance (Feet): 35 Feet Assistive device: Rolling walker;None Ambulation/Gait Assistance Details: Ambulated to bathroom without device and needed mod assist for stability as bil LEs shaky.  Obtained RW and gait was steadier with pt having UE support.  Needed cues to stay close to RW and assist to steer RW.   Gait Pattern: Decreased stride length;Step-to pattern;Wide base of support;Trunk flexed Gait velocity: decreased Stairs: No Wheelchair Mobility Wheelchair Mobility: No         PT Diagnosis: Generalized weakness  PT Problem List: Decreased activity tolerance;Decreased balance;Decreased mobility;Decreased coordination;Decreased safety awareness;Decreased knowledge of precautions;Decreased knowledge of use of DME;Decreased strength PT Treatment Interventions: Gait training;DME instruction;Functional mobility training;Stair training;Therapeutic activities;Therapeutic exercise;Balance training;Patient/family education   PT Goals Acute Rehab PT Goals PT Goal Formulation: With patient Time For Goal Achievement: 02/10/13 Potential to Achieve Goals: Good Pt will go Supine/Side to Sit: Independently PT Goal: Supine/Side to Sit - Progress: Goal set today Pt will go Sit to Stand: Independently PT Goal: Sit to Stand - Progress: Goal set today Pt  will Transfer Bed to Chair/Chair to Bed: Independently PT Transfer Goal: Bed  to Chair/Chair to Bed - Progress: Goal set today Pt will Ambulate: >150 feet;with modified independence;with least restrictive assistive device PT Goal: Ambulate - Progress: Goal set today Pt will Go Up / Down Stairs: 6-9 stairs;with modified independence;with least restrictive assistive device PT Goal: Up/Down Stairs - Progress: Goal set today Additional Goals Additional Goal #1: Pt to have a negative Hallpike test  [employer].  PT Goal: Additional Goal #1 - Progress: Goal set today  Visit Information  Last PT Received On: 01/27/13 Assistance Needed: +1    Subjective Data  Subjective: "I don't know what has happened to me." Patient Stated Goal: To go home   Prior Functioning  Home Living Lives With: Alone Available Help at Discharge: Available PRN/intermittently;Family;Friend(s) Type of Home: Apartment Home Access: Stairs to enter Entrance Stairs-Number of Steps: 6 Entrance Stairs-Rails: Left Home Layout: One level Bathroom Shower/Tub: Engineer, manufacturing systems: Standard Home Adaptive Equipment: Straight cane Prior Function Level of Independence: Independent with assistive device(s) Able to Take Stairs?: Yes Driving: No Vocation: On disability Communication Communication: No difficulties Dominant Hand: Right    Cognition  Cognition Overall Cognitive Status: Appears within functional limits for tasks assessed/performed Arousal/Alertness: Awake/alert Orientation Level: Appears intact for tasks assessed Behavior During Session: [facility] for tasks performed    Extremity/Trunk Assessment Right Lower Extremity Assessment RLE ROM/Strength/Tone: [facility] for tasks assessed RLE Coordination: Deficits RLE Coordination Deficits: poor coordination of movement at times with gait Left Lower Extremity Assessment LLE ROM/Strength/Tone: WFL for tasks assessed LLE Coordination: Deficits LLE Coordination Deficits: poor coordination of movement at times with gait   Balance Dynamic Sitting  Balance Dynamic Sitting - Balance Support: No upper extremity supported;Feet supported Dynamic Sitting - Level of Assistance: 6: Modified independent (Device/Increase time) Dynamic Sitting - Balance Activities: Lateral lean/weight shifting;Forward lean/weight shifting;Reaching across midline Dynamic Sitting - Comments: No problems Static Standing Balance Static Standing - Balance Support: Bilateral upper extremity supported;During functional activity Static Standing - Level of Assistance: 4: Min assist Static Standing - Comment/# of Minutes: 2 minutes - needed steadying assist  End of Session PT - End of Session Equipment Utilized During Treatment: Gait belt Activity Tolerance: Patient tolerated treatment well Patient left: in chair;with call bell/phone within reach Nurse Communication: Mobility status       INGOLD,Treydon Henricks 01/27/2013, 1:09 PM  [facility] [phone] (pager)

## 2013-01-27 NOTE — Evaluation (Addendum)
Occupational Therapy Evaluation Patient Details Name: Jennifer [name] MRN: [id number] DOB: [birth year] Today's Date: 01/27/2013 Time: 4098-1191 OT Time Calculation (min): 8 min  OT Assessment / Plan / Recommendation Clinical Impression  52 yo female admitted s/p fall with positive testing for vertigo with PT Dawn. Ot to follow acutely for balance with ADLS. Recommend HHOT for d/c planning    OT Assessment  Patient needs continued OT Services    Follow Up Recommendations  Home health OT    Barriers to Discharge      Equipment Recommendations  None recommended by OT    Recommendations for Other Services    Frequency  Min 2X/week    Precautions / Restrictions Precautions Precautions: Fall   Pertinent Vitals/Pain     ADL  Eating/Feeding: Independent Where Assessed - Eating/Feeding: Edge of bed Grooming: Wash/dry hands;Supervision/safety Where Assessed - Grooming: Unsupported standing Lower Body Dressing: Supervision/safety Where Assessed - Lower Body Dressing: Unsupported sit to stand Toilet Transfer: Supervision/safety Toilet Transfer Method: Sit to Barista: Regular height toilet;Grab bars Toileting - Clothing Manipulation and Hygiene: Supervision/safety Where Assessed - Engineer, mining and Hygiene: Sit to stand from 3-in-1 or toilet Equipment Used: Gait belt Transfers/Ambulation Related to ADLs: Pt ambulated to rest room Supervision level using RW. Pt picking up RW during transfers. Question d/c RW due to unsafe use of RW v/s continued practice with RW for safety. Pt educated on incr fall risk carrying RW and not using properily ADL Comments: pt reports dizziness 4 out 10 at this time which is improvement from 8 out 10 iwth PT evaluation. pt laying supine with two pillows for MRI this afternoon. OT to follow up on 01/28/13 to address vertigo with adls and RW safety    OT Diagnosis: Generalized weakness  OT Problem List: Decreased  strength;Decreased activity tolerance;Impaired balance (sitting and/or standing);Decreased coordination;Decreased safety awareness;Decreased knowledge of use of DME or AE;Decreased knowledge of precautions OT Treatment Interventions: Self-care/ADL training;DME and/or AE instruction;Therapeutic activities;Patient/family education;Balance training   OT Goals Acute Rehab OT Goals OT Goal Formulation: With patient Time For Goal Achievement: 02/10/13 Potential to Achieve Goals: Good ADL Goals Pt Will Perform Grooming: with modified independence;Standing at sink ADL Goal: Grooming - Progress: Goal set today Pt Will Perform Lower Body Bathing: with modified independence;Standing at sink ADL Goal: Lower Body Bathing - Progress: Goal set today Pt Will Perform Lower Body Dressing: with modified independence;Sit to stand from chair ADL Goal: Lower Body Dressing - Progress: Goal set today Pt Will Transfer to Toilet: with modified independence;Regular height toilet ADL Goal: Toilet Transfer - Progress: Goal set today  Visit Information  Last OT Received On: 01/27/13 Assistance Needed: +1    Subjective Data  Subjective: "it has been bad, I was on two pillows at MRI" Patient Stated Goal: to get this dizziness to stop   Prior Functioning     Home Living Lives With: Alone Available Help at Discharge: Available PRN/intermittently;Family;Friend(s) Type of Home: Apartment Home Access: Stairs to enter Entrance Stairs-Number of Steps: 6 Entrance Stairs-Rails: Left Home Layout: One level Bathroom Shower/Tub: Engineer, manufacturing systems: Standard Home Adaptive Equipment: Straight cane Prior Function Level of Independence: Independent with assistive device(s) Able to Take Stairs?: Yes Driving: No Vocation: On disability Communication Communication: No difficulties Dominant Hand: Right         Vision/Perception Vision - History Baseline Vision: Other (comment) (pt with strabismus at  baseline ) Patient Visual Report: No change from baseline Vision - Assessment Eye Alignment:  Impaired (comment)   Cognition  Cognition Overall Cognitive Status: Appears within functional limits for tasks assessed/performed Arousal/Alertness: Awake/alert Orientation Level: Appears intact for tasks assessed Behavior During Session: [facility] for tasks performed    Extremity/Trunk Assessment Right Upper Extremity Assessment RUE ROM/Strength/Tone: Within functional levels RUE Sensation: WFL - Light Touch RUE Coordination: WFL - gross/fine motor Left Upper Extremity Assessment LUE ROM/Strength/Tone: Within functional levels LUE Sensation: WFL - Light Touch LUE Coordination: WFL - gross/fine motor     Mobility Sit to Stand: 5: Supervision;With upper extremity assist;From bed Stand to Sit: 5: Supervision;With upper extremity assist;To bed Details for Transfer Assistance: unsafe use of RW     Exercise     Balance Dynamic Sitting Balance Dynamic Sitting - Balance Support: No upper extremity supported;Feet supported Dynamic Sitting - Level of Assistance: 6: Modified independent (Device/Increase time) Dynamic Sitting - Balance Activities: Lateral lean/weight shifting;Forward lean/weight shifting;Reaching across midline Dynamic Sitting - Comments: No problems Static Standing Balance Static Standing - Balance Support: Bilateral upper extremity supported;During functional activity Static Standing - Level of Assistance: 4: Min assist Static Standing - Comment/# of Minutes: 2 minutes - needed steadying assist   End of Session OT - End of Session Activity Tolerance: Patient tolerated treatment well Patient left: in bed;with call bell/phone within reach Nurse Communication: Mobility status;Precautions  GO     Lucile Shutters 01/27/2013, 4:02 PM Pager: [phone]

## 2013-01-27 NOTE — Progress Notes (Signed)
Subjective: Pt had one episode of dizziness overnight. Pt was not orthostatic. G+cocci was found on 2/2 Bcx. No fevers overnight. MRI still pending.   Objective: Vital signs in last 24 hours: Filed Vitals:   01/26/13 2020 01/26/13 2053 01/27/13 0000 01/27/13 0443  BP: 149/110  130/70 146/73  Pulse: 84 73  63  Temp:    97.3 F (36.3 C)  TempSrc: Oral   Oral  Resp: 20 20  20   Height:      Weight:    259 lb 14.4 oz (117.89 kg)  SpO2:  98%  97%   Weight change: -3 lb 14.4 oz (-1.769 kg)  Intake/Output Summary (Last 24 hours) at 01/27/13 1238 Last data filed at 01/26/13 1700  Gross per 24 hour  Intake    360 ml  Output      0 ml  Net    360 ml   General: sitting at edge of bed HEENT: PERRL, EOMI, no scleral icterus Cardiac: RRR, no rubs, murmurs or gallops Pulm: clear to auscultation bilaterally, moving normal volumes of air Abd: soft, nontender, nondistended, BS present Ext: warm and well perfused, no pedal edema, r leg hyperkeratosis hyperpigmented scaling lesion along frontal shin, no sensation in feet bilaterally, black escar scab on left foot with some surrounding erythema and serosanguinous drainage, right foot with underlying ulcer some extending purulence up to lateral malleolus.  Neuro: alert and oriented X3, cranial nerves II-XII grossly intact  Lab Results: Basic Metabolic Panel:  Lab 01/26/13 1610 01/25/13 2142  NA 132* 131*  K 3.6 3.0*  CL 92* 91*  CO2 30 26  GLUCOSE 379* 367*  BUN 9 9  CREATININE 0.92 0.78  CALCIUM 8.4 8.7  MG -- --  PHOS -- --   Liver Function Tests:  Lab 01/26/13 0030  AST 32  ALT 32  ALKPHOS 94  BILITOT 0.8  PROT 6.9  ALBUMIN 3.1*   CBC:  Lab 01/26/13 0451 01/25/13 2142  WBC 14.7* 12.2*  NEUTROABS -- 9.7*  HGB 12.3 13.6  HCT 37.3 40.4  MCV 95.2 94.8  PLT 110* 113*   Cardiac Enzymes:  Lab 01/26/13 0451  CKTOTAL --  CKMB --  CKMBINDEX --  TROPONINI <0.30   BNP:  Lab 01/26/13 0028  PROBNP 727.3*   CBG:  Lab  01/27/13 0735 01/26/13 2027 01/26/13 1650 01/26/13 0852  GLUCAP 369* 221* 227* 291*   Hemoglobin A1C:  Lab 01/26/13 0030  HGBA1C 9.5*   Thyroid Function Tests:  Lab 01/26/13 0156  TSH 0.815  T4TOTAL --  FREET4 --  T3FREE --  THYROIDAB --   Anemia Panel:  Lab 01/26/13 0156  VITAMINB12 614  FOLATE --  FERRITIN --  TIBC --  IRON --  RETICCTPCT --   Urine Drug Screen: Drugs of Abuse     Component Value Date/Time   LABOPIA NONE DETECTED 01/26/2013 0036   LABOPIA SEE NOTE 07/21/2012 1631   COCAINSCRNUR NONE DETECTED 01/26/2013 0036   COCAINSCRNUR NEG 07/21/2012 1631   LABBENZ NONE DETECTED 01/26/2013 0036   LABBENZ NEG 07/21/2012 1631   LABBENZ NEG 06/28/2010 2130   AMPHETMU NONE DETECTED 01/26/2013 0036   AMPHETMU NEG 06/28/2010 2130   THCU NONE DETECTED 01/26/2013 0036   LABBARB NONE DETECTED 01/26/2013 0036   LABBARB NEG 07/21/2012 1631    Urinalysis:  Lab 01/26/13 0036  COLORURINE YELLOW  LABSPEC 1.033*  PHURINE 6.0  GLUCOSEU >1000*  HGBUR NEGATIVE  BILIRUBINUR NEGATIVE  KETONESUR 15*  PROTEINUR NEGATIVE  UROBILINOGEN  1.0  NITRITE NEGATIVE  LEUKOCYTESUR NEGATIVE   Micro Results: Recent Results (from the past 240 hour(s))  CULTURE, BLOOD (ROUTINE X 2)     Status: Normal (Preliminary result)   Collection Time   01/26/13 12:30 AM      Component Value Range Status Comment   Specimen Description BLOOD ARM LEFT   Final    Special Requests BOTTLES DRAWN AEROBIC AND ANAEROBIC 10CC   Final    Culture  Setup Time 01/26/2013 03:50   Final    Culture     Final    Value: GRAM POSITIVE COCCI IN PAIRS     Note: Gram Stain Report Called to,Read Back By and Verified With: CHRISTY JOHNSON ON 01/26/2013 AT 6:50P BY WILEJ   Report Status PENDING   Incomplete   CULTURE, BLOOD (ROUTINE X 2)     Status: Normal (Preliminary result)   Collection Time   01/26/13 12:45 AM      Component Value Range Status Comment   Specimen Description BLOOD HAND RIGHT   Final    Special Requests BOTTLES DRAWN  AEROBIC AND ANAEROBIC 10CC   Final    Culture  Setup Time 01/26/2013 03:50   Final    Culture     Final    Value: GRAM POSITIVE COCCI IN PAIRS     Note: Gram Stain Report Called to,Read Back By and Verified With: CHRISTY JOHNSON ON 01/26/2013 AT 6:50P BY WILEJ   Report Status PENDING   Incomplete    Studies/Results: Ct Head Wo Contrast  01/26/2013  *RADIOLOGY REPORT*  Clinical Data:  Episode of passing out.  Headache.  Diabetic hypertensive patient with hyperlipidemia.  CT HEAD WITHOUT CONTRAST CT CERVICAL SPINE WITHOUT CONTRAST  Technique:  Multidetector CT imaging of the head and cervical spine was performed following the standard protocol without intravenous contrast.  Multiplanar CT image reconstructions of the cervical spine were also generated.  Comparison:  08/18/2007 MR brain.  CT HEAD  Findings: No skull fracture or intracranial hemorrhage.  No hydrocephalus.  No CT evidence of large acute infarct.  Scattered mild nonspecific white matter type changes.  Exophthalmos.  Mastoid air cells, middle ear cavities and visualized paranasal sinuses are clear.  IMPRESSION: No acute abnormality.  Please see above.  CT CERVICAL SPINE  Findings: Evaluation limited by patient's habitus.  No cervical spine fracture or malalignment is noted.  Carotid artery calcifications.  Heterogeneous slightly prominent appearance of the thyroid gland.  IMPRESSION: Evaluation limited by patient's habitus.  No cervical spine fracture or malalignment is noted.  Carotid artery calcifications.  Heterogeneous slightly prominent appearance of the thyroid gland.   Original Report Authenticated By: Lacy Duverney, M.D.    Ct Cervical Spine Wo Contrast  01/26/2013  *RADIOLOGY REPORT*  Clinical Data:  Episode of passing out.  Headache.  Diabetic hypertensive patient with hyperlipidemia.  CT HEAD WITHOUT CONTRAST CT CERVICAL SPINE WITHOUT CONTRAST  Technique:  Multidetector CT imaging of the head and cervical spine was performed following the  standard protocol without intravenous contrast.  Multiplanar CT image reconstructions of the cervical spine were also generated.  Comparison:  08/18/2007 MR brain.  CT HEAD  Findings: No skull fracture or intracranial hemorrhage.  No hydrocephalus.  No CT evidence of large acute infarct.  Scattered mild nonspecific white matter type changes.  Exophthalmos.  Mastoid air cells, middle ear cavities and visualized paranasal sinuses are clear.  IMPRESSION: No acute abnormality.  Please see above.  CT CERVICAL SPINE  Findings: Evaluation limited  by patient's habitus.  No cervical spine fracture or malalignment is noted.  Carotid artery calcifications.  Heterogeneous slightly prominent appearance of the thyroid gland.  IMPRESSION: Evaluation limited by patient's habitus.  No cervical spine fracture or malalignment is noted.  Carotid artery calcifications.  Heterogeneous slightly prominent appearance of the thyroid gland.   Original Report Authenticated By: Lacy Duverney, M.D.    Dg Chest Portable 1 View  01/25/2013  *RADIOLOGY REPORT*  Clinical Data: Loss of consciousness.  Severe shortness of breath. History of smoking, COPD, diabetes, hypertension.  PORTABLE CHEST - 1 VIEW  Comparison: 09/04/2011  Findings: The heart is mildly enlarged.  There are interstitial changes consistent with mild edema.  No focal consolidations or pleural effusions are identified.  IMPRESSION: Changes consistent with mild pulmonary edema.   Original Report Authenticated By: Norva Pavlov, M.D.    Medications: I have reviewed the patient's current medications. Scheduled Meds:   . aspirin  81 mg Oral Daily  . atenolol  100 mg Oral Daily  . benazepril  40 mg Oral Daily  . cefTRIAXone (ROCEPHIN)  IV  2 g Intravenous Q24H  . clopidogrel  75 mg Oral Daily  . escitalopram  20 mg Oral Daily  . furosemide  40 mg Intravenous Daily  . heparin  5,000 Units Subcutaneous Q8H  . hydrochlorothiazide  25 mg Oral Daily  . insulin aspart  0-15  Units Subcutaneous TID WC  . insulin aspart  0-5 Units Subcutaneous QHS  . insulin aspart protamine-insulin aspart  125 Units Subcutaneous Q breakfast  . insulin aspart protamine-insulin aspart  65 Units Subcutaneous Q supper  . ipratropium  0.5 mg Nebulization Q6H  . levalbuterol  0.63 mg Nebulization Q6H  . mometasone-formoterol  2 puff Inhalation BID  . nicotine  14 mg Transdermal Q24H  . pantoprazole  40 mg Oral Daily  . potassium chloride SA  40 mEq Oral BID  . predniSONE  60 mg Oral Q breakfast  . pregabalin  100 mg Oral TID  . simvastatin  20 mg Oral q1800  . sodium chloride  3 mL Intravenous Q12H  . vancomycin  1,000 mg Intravenous Q12H   Continuous Infusions:  PRN Meds:.sodium chloride, acetaminophen, acetaminophen, albuterol, benzonatate, ondansetron (ZOFRAN) IV, ondansetron, oxyCODONE, sodium chloride Assessment/Plan: Jennifer [name] 52 yo woman pmh DM poorly controlled and severe sequale including foot ulcers, PVD s/p stenting presents with recent falls found to be bacteremic and concerning bilateral foot ulcers.   1. G+ cocci bacteremia: pt was slightly hypotensive overnight and responded to fluid rescitation. This maybe etiology to recent falls. Pt was started on vanco overnight when report of bacteremia: -repeat BCx -BCx G+ cocci pending speciation and sensitivities -depending on MRI will determine whether TEE needs to be pursuded as pt may need already extended Abx treatment -vanco and ceftriaxone added today  2. DM foot ulcers: WOC evaluation concerning for underlying abscess/necrosis and may need debridement.  -MRI pending -will need to consult ortho depending on ortho for debridement -will wait to start Abx see above.  Dispo: Disposition is deferred at this time, awaiting improvement of current medical problems.  Anticipated discharge in approximately 3-4 day(s).   The patient does have a current PCP Blanch Media, MD), therefore will be requiring OPC follow-up  after discharge.   The patient does not have transportation limitations that hinder transportation to clinic appointments.  .Services Needed at time of discharge: Y = Yes, Blank = No PT:   OT:   RN:   Equipment:  Other:     LOS: 2 days   Christen Bame 01/27/2013, 12:38 PM [phone]

## 2013-01-27 NOTE — Progress Notes (Signed)
Pt was resting in bed and stated she suddenly felt dizzy.  Pt denied any pain or SOB.  Pt alert and oriented. VS stable. Md on call made aware no new orders received. Will cont to monitor pt.

## 2013-01-27 NOTE — Progress Notes (Signed)
UR Completed Onell Mcmath Graves-Bigelow, RN,BSN [phone]  

## 2013-01-27 NOTE — Care Management Note (Addendum)
    Page 1 of 2   02/02/2013     11:44:59 AM   CARE MANAGEMENT NOTE 02/02/2013  Patient:  Jennifer [name], Jennifer [name]   Account Number:  [id number]  Date Initiated:  01/27/2013  Documentation initiated by:  GRAVES-BIGELOW,BRENDA  Subjective/Objective Assessment:   Pt admitted with  with frequent falls at home worsening diabetic wounds and blood cultures growing 2/2 GPC.     Action/Plan:   Per PT pt will beneift from HHPT.HH  RN would be helpful also. CM will continue to monitor for disposition needs.   Anticipated DC Date:  01/31/2013   Anticipated DC Plan:  HOME W HOME HEALTH SERVICES      DC Planning Services  CM consult      [facility] Choice  HOME HEALTH  DURABLE MEDICAL EQUIPMENT   Choice offered to / List presented to:  C-1 Patient   DME arranged  3-N-1  Levan Hurst      DME agency  [facility]     [facility] arranged  HH-1 RN  HH-10 DISEASE MANAGEMENT  HH-2 PT  HH-3 OT      [facility] agency  [facility]   Status of service:  Completed, signed off Medicare Important Message given?   (If response is "NO", the following Medicare IM given date fields will be blank) Date Medicare IM given:   Date Additional Medicare IM given:    Discharge Disposition:  HOME W HOME HEALTH SERVICES  Per UR Regulation:  Reviewed for med. necessity/level of care/duration of stay  If discussed at Long Length of Stay Meetings, dates discussed:    Comments:  02/02/13- 1100- Donn Pierini RN BSN [phone] Pt discharged DME and HH orders noted in epic- called [facility] to confirm services- per [name] with [facility] they did receive orders and will also be following pt for wound care on bil feet. Per RN - Dr. Donata Clay had request to see if outpt hydrotherapy could be done- called and spoke with Dr. Donata Clay to confirm what he wanted- he stated that is hydrotherapy was available outpt he felt like pt would benefit and would like pt set up- informed him that this CM would check with wound care center to  see if they offered hydrotherapy and make referral if that was an option. - Call made to [facility]- spoke with Harriett Sine- who stated at this time they do not offer outpt hydrotherapy. No referral made to wound Care Center- pt will be followed by [facility] for wound care (no outpt hydrotherapy available).  02-01-13 [address], [place] [phone] CM did call MD to ask for orders for [facility] listed above and dme. Awaiting call back before referral made. Pt awaiting picc line to be placed for iv abx therapy. Pt states her son will be at home to learn how to administer iv abx. MD please write hh/dme orders and will need to have a Rx for IV abx. Thanks. CM will continue to monitor.  01-31-13 [address], [place] [phone] Pt will need [facility] services. Will see if CT can be pulled on Tuesday PER MD. WIll continue to monitor.

## 2013-01-27 NOTE — Telephone Encounter (Signed)
review 

## 2013-01-27 NOTE — Progress Notes (Signed)
Jennifer [name] is a 52 yo [place] American woman with h/o T2DM complicated by foot ulcers and peripheral neuropathy, PVD s/p stent in bilateral common iliac arteries, HTN, and COPD who presents with dizziness and falls.   Overnight: Patient experienced dizziness again this AM when getting out of the bed to use the bathroom. When patient returned to bed, she continued to feel dizzy and nauseated while lying down. Patient continues to complain of erythema in R ankle and ulcer on bottom of R foot, but no increase in pain.   ROS: GI: no constipation or diarrhea GU: no changes in urine pattern, reports polyuria  Medication:    . aspirin  81 mg Oral Daily  . atenolol  100 mg Oral Daily  . benazepril  40 mg Oral Daily  . cefTRIAXone (ROCEPHIN)  IV  2 g Intravenous Q24H  . clopidogrel  75 mg Oral Daily  . escitalopram  20 mg Oral Daily  . furosemide  40 mg Intravenous Daily  . heparin  5,000 Units Subcutaneous Q8H  . hydrochlorothiazide  25 mg Oral Daily  . insulin aspart  0-15 Units Subcutaneous TID WC  . insulin aspart  0-5 Units Subcutaneous QHS  . insulin aspart protamine-insulin aspart  125 Units Subcutaneous Q breakfast  . insulin aspart protamine-insulin aspart  65 Units Subcutaneous Q supper  . ipratropium  0.5 mg Nebulization Q6H  . levalbuterol  0.63 mg Nebulization Q6H  . mometasone-formoterol  2 puff Inhalation BID  . nicotine  14 mg Transdermal Q24H  . pantoprazole  40 mg Oral Daily  . potassium chloride SA  40 mEq Oral BID  . predniSONE  60 mg Oral Q breakfast  . pregabalin  100 mg Oral TID  . simvastatin  20 mg Oral q1800  . sodium chloride  3 mL Intravenous Q12H  . vancomycin  1,000 mg Intravenous Q12H  PRN meds: sodium chloride, acetaminophen, acetaminophen, albuterol, benzonatate, ondansetron (ZOFRAN) IV, ondansetron, oxyCODONE, sodium chloride  Vitals: Filed Vitals:   01/27/13 0443  BP: 146/73  Pulse: 63  Temp: 97.3 F (36.3 C)  Resp: 20  Weight: 117.89kg (down  from 119.65kg)  Orthostatic vitals:  Lying: 97/61 Sitting: 108/67 Standing: 128/76  Fluids:  I: O: not recorded B: 480  Physical exam:  Most of the exam was unable to be performed because patient refused to move or change position. GEN: Alert, not in acute distress, obese female, lying on her side  HEENT: PERRLA, EOMI, MMM, supple neck CV: Diminished heart sounds (patient was lying on her right side) PULM: no WOB, not in acute respiratory distress, diminished lung sounds (but this was assessed when patient was lying on her side) ABDO: NDNT NEURO: A&Ox3, numbness in LE bilaterally Extremities: 2+ pedal edema bilaterally, <2s capillary refill  Labs: BMET    Component Value Date/Time   NA 132* 01/26/2013 0451   K 3.6 01/26/2013 0451   CL 92* 01/26/2013 0451   CO2 30 01/26/2013 0451   GLUCOSE 379* 01/26/2013 0451   BUN 9 01/26/2013 0451   CREATININE 0.92 01/26/2013 0451   CREATININE 0.77 05/26/2012 1626   CALCIUM 8.4 01/26/2013 0451   GFRNONAA 70* 01/26/2013 0451   GFRAA 81* 01/26/2013 0451   CBC    Component Value Date/Time   WBC 14.7* 01/26/2013 0451   RBC 3.92 01/26/2013 0451   HGB 12.3 01/26/2013 0451   HCT 37.3 01/26/2013 0451   PLT 110* 01/26/2013 0451   MCV 95.2 01/26/2013 0451   MCH 31.4  01/26/2013 0451   MCHC 33.0 01/26/2013 0451   RDW 14.1 01/26/2013 0451   LYMPHSABS 1.7 01/25/2013 2142   MONOABS 0.7 01/25/2013 2142   EOSABS 0.0 01/25/2013 2142   BASOSABS 0.0 01/25/2013 2142    01/26/2013 H A1c: 9.5 B12: 614 (wnl) Troponin I: <0.30 TSH: 0.815 (wnl) HIV: non-reactive UDS: negative for all ECG: sinus tachycardia TTE: 60-65% EF, no valvular problems, no dysfunction of ventricles Doppler carotid: No stenosis in carotid arteries LE Venous duplex: no DVT, no superficial thrombosis or Backer's cyst Blood Cultures: Gram + cocci in pairs on both blood cultures  Assessment and Plan: Jennifer [name] is a 52yo African American woman with h/o T2DM complicated by foot ulcers and peripheral neuropathy,  PVD s/p stenting in bilateral common iliac, HTN, COPD, CKD and fibromylagia who presents with dizziness and falls.  1. Falls and dizziness:  Patient most likely has syncope secondary to bacteremia. Other possible diagnoses include diabetes autonomic neuropathy, vasovagal syncope, cardiac causes, hypovolemia, and nerve-blocking medication induced syncope. Patient's WBC count and positive blood cultures for Gram (+) cocci in pairs indicates that she is bacteremic, despite the lack of fever. Patient's hypovolemia can be due to venodilation that occurs with bacteremia. Although her orthostatic vitals were normal the day before, the morning presentation of dizziness when she gets up to use the bathroom can be caused by the venodilation.   Patient does not show overt signs of autonomic neuropathy because she has not had changes in urination or defecation pattern. She does experience nausea, but no vomiting to indicate gastroparesis, which is a symptom of autonomic neuropathy. Cardiac causes are unlikely due to patient's normal valve structures and a lack of obstructive etiologies on TTE. No arrhythmias are seen on ECG or tele. Troponin I levels have been within normal. There is no carotid stenosis from carotid ultrasound. Vasovagal etiologies are unlikely due to a lack of history of dizziness when turning head, but we would need to perform carotid massage to test for this etiology. Patient was scheduled to have PT evaluation of vagovasal syncope, but patient refused. Patient could be hypovolemic due to use of Lasix. Her weight dropped 2kg in one day. Her home medications such as Lyrica and Baclofen could be causes of her syncope, but they have been held.   -Start vancomycin and ceftriaxone, and stop doxycycline -Monitor for signs of sepsis -TEE, a more sensitive test for valvular disease -Started maintenance IVF for hypovolemia -Tele bed to monitor for any arrhythmia overnight  2. R lower extremity  swelling/redness Patient most likely has cellulitis or osteomyelitis due to infection from foot ulcer. We need to perform MRI to evaluate for any abscess or periosteal reaction, or soft tissue inflammation. DVT is not a likely cause right now due to a lack of DVT seen on LE venous duplex. -MRI of both feet -continue antibiotics as listed above  3. R foot ulcer Wound care saw patient and recommended orthopedic evaluation for R foot and maybe L foot. They recommended debridement and wound healing for the right foot.  -Consult Ortho for R foot  4. COPD Patient has worsening chronic cough and new yellow/green sputum production. She has a 60 pack years of smoking history. She reported worsening cough, change in sputum quality, and SOB, which indicates COPD exacerbation. Thus, she was started on oral prednisone yesterday.  -Prednisone PO -levalbuterol, ipratropium nebs -Mometasone-formoterol inhaler  5. Pulmonary edema -Patient showed signs of pulmonary edema on CXR, but EF was 60-65% on recent ECHO. However  she complained of SOB, her foot are swollen, and her pro-BNP was elevated to the 700s. She does not appear to have heart failure clinically. Thus, we will stop furosemide since she may be bacteremic that could lead to sepsis.  -monitor BNP level  6. Headache  Patient has had HA for several days, unclear if preceded or followed falls. No visual changes or pain on palpation of temporal artery, CT unremarkable.  -Check ESR  7. Hyperglycemia in setting of T2DM Patient has been non adherent, and has received diabetic education while in the hospital.  -Home insulin regiemtn with Novolog 125U qAM adn 65U qPM -SSI -Follow Bmet and AG  8. HTN -Continue home regimen of atenolo, HCTZ, and benazepril  9. Peripheral vascular disease -Continue clopedogrel  10. Depression/mood disorder -Continue escitalopram  11. Fibromyalgia -Continue pregabalin  12: nicotine withdrawal -nicotine patch  14mg   Proph:  -DVT: heparin 5000U [place] -CAD in the setting of T2DM: aspirin, simvastatin -GI: pantoprazole  Dispo: patient is not ready to be discharged at this time due to active medical problems that need inpatient observation and management.

## 2013-01-27 NOTE — Clinical Documentation Improvement (Signed)
GENERIC DOCUMENTATION CLARIFICATION QUERY  THIS DOCUMENT IS NOT A PERMANENT PART OF THE MEDICAL RECORD  TO RESPOND TO THE THIS QUERY, FOLLOW THE INSTRUCTIONS BELOW:  1. If needed, update documentation for the patient's encounter via the notes activity.  2. Access this query again and click edit on the In [employer].  3. After updating, or not, click F2 to complete all highlighted (required) fields concerning your review. Select "additional documentation in the medical record" OR "no additional documentation provided".  4. Click Sign note button.  5. The deficiency will fall out of your In Basket *Please let us know if you are not able to complete this workflow by phone or e-mail (listed below).  Please update your documentation within the medical record to reflect your response to this query.                                                                                        01/27/13   Dear Dr. Burtis Junes  / Associates,  In a better effort to capture your patient's severity of illness/SOI, risk of mortality/ROM, reflect appropriate length of stay and utilization of resources, a review of the patient medical record has revealed the following indicators.   PLEASE CLARIFY IN NOTES AND DC SUMMARY THE ACUITY OF 'MILD PULMONARY EDEMA' TO MORE CLEARLY INDICATE PATIENT'S SEVERITY OF ILLNESS AND RISK OF MORTALITY. THANK YOU.  Possible Clinical Conditions? - Acute Pulmonary edema - Other Condition (please specify)   Supporting Information: - Risk Factors: DM2 uncontrolled with bilateral diabetic foot ulcers, COPD exacerbation, frequent falls - Signs & Symptoms: tachypneic with wheezing, tachycardic - Diagnostics: CXR: "mild pulmonary edema" - Treatment: IV Lasix, Nebs   You may use possible, probable, or suspect with inpatient documentation. possible, probable, suspected diagnoses MUST be documented at the time of discharge  Reviewed: additional documentation in the medical record  Thank  You,  Beverley Fiedler  Clinical Documentation Specialist:  Pager  Health Information Management [place]

## 2013-01-27 NOTE — Clinical Documentation Improvement (Signed)
GENERIC DOCUMENTATION CLARIFICATION QUERY  THIS DOCUMENT IS NOT A PERMANENT PART OF THE MEDICAL RECORD  TO RESPOND TO THE THIS QUERY, FOLLOW THE INSTRUCTIONS BELOW:  1. If needed, update documentation for the patient's encounter via the notes activity.  2. Access this query again and click edit on the In [employer].  3. After updating, or not, click F2 to complete all highlighted (required) fields concerning your review. Select "additional documentation in the medical record" OR "no additional documentation provided".  4. Click Sign note button.  5. The deficiency will fall out of your In Basket *Please let us know if you are not able to complete this workflow by phone or e-mail (listed below).  Please update your documentation within the medical record to reflect your response to this query.                                                                                        01/27/13   Dear Dr. Burtis Junes  / Associates,  In a better effort to capture your patient's severity of illness/SOI, risk of mortality/ROM, reflect appropriate length of stay and utilization of resources, a review of the patient medical record has revealed the following indicators.   PATIENT'S PRIMARY DIAGNOSIS CURRENTLY 'SYNCOPE' WHICH IS A SYMPTOM WITH LOW LOS. PLEASE CLARIFY IN NOTES AND DC SUMMARY POSSIBLE/LIKELY/SUSPECTED UNDERLYING CAUSE OF 'SYNCOPE'.  Possible Clinical Conditions? - Diabetic Autonomic Neuropathy - COPD exacerbation - Orthostatic Hypotension - Other cause (please document)  Supporting Information: - Risk Factors: DM2 with complications PVD, neuropathy, COPD with current exacerbation, mild Pulmonary Edema, bilateral Chronic Diabetic foot ulcers, frequent falls sometimes with 'blackouts' and sometimes not - Assessment: "could be diabetic autonomic neuropathy" per H&P - Treatment: WOC consulted, IV ABX   You may use possible, probable, or suspect with inpatient documentation. possible,  probable, suspected diagnoses MUST be documented at the time of discharge  Reviewed: additional documentation in the medical record  Thank You,  Beverley Fiedler RN BSN Clinical Documentation Specialist: Tele:  [phone]  Health Information Management [place]

## 2013-01-27 NOTE — Progress Notes (Signed)
Inpatient Diabetes Program Recommendations  AACE/ADA: New Consensus Statement on Inpatient Glycemic Control (2013)  Target Ranges:  Prepandial:   less than 140 mg/dL      Peak postprandial:   less than 180 mg/dL (1-2 hours)      Critically ill patients:  140 - 180 mg/dL    Results for Jennifer [name], Jennifer [name] (MRN [id number]) as of 01/27/2013 14:30  Ref. Range 01/27/2013 07:35 01/27/2013 13:54  Glucose-Capillary Latest Range: 70-99 mg/dL 409 (H) 811 (H)   Patient received both doses of 70/30 insulin yesterday.  Had significantly elevated fasting glucose this morning.  If this trend continues, may want to titrate supper time dose of 70/30 insulin upward.   Note: Will follow. Ambrose Finland RN, MSN, CDE Diabetes Coordinator Inpatient Diabetes Program [phone]

## 2013-01-28 DIAGNOSIS — L02619 Cutaneous abscess of unspecified foot: Secondary | ICD-10-CM

## 2013-01-28 DIAGNOSIS — B951 Streptococcus, group B, as the cause of diseases classified elsewhere: Secondary | ICD-10-CM

## 2013-01-28 DIAGNOSIS — F3289 Other specified depressive episodes: Secondary | ICD-10-CM

## 2013-01-28 DIAGNOSIS — R55 Syncope and collapse: Secondary | ICD-10-CM

## 2013-01-28 DIAGNOSIS — I10 Essential (primary) hypertension: Secondary | ICD-10-CM

## 2013-01-28 DIAGNOSIS — A4901 Methicillin susceptible Staphylococcus aureus infection, unspecified site: Secondary | ICD-10-CM

## 2013-01-28 DIAGNOSIS — L97909 Non-pressure chronic ulcer of unspecified part of unspecified lower leg with unspecified severity: Secondary | ICD-10-CM

## 2013-01-28 DIAGNOSIS — F329 Major depressive disorder, single episode, unspecified: Secondary | ICD-10-CM

## 2013-01-28 DIAGNOSIS — L97509 Non-pressure chronic ulcer of other part of unspecified foot with unspecified severity: Secondary | ICD-10-CM

## 2013-01-28 DIAGNOSIS — E1169 Type 2 diabetes mellitus with other specified complication: Secondary | ICD-10-CM

## 2013-01-28 DIAGNOSIS — R7881 Bacteremia: Principal | ICD-10-CM

## 2013-01-28 DIAGNOSIS — L03119 Cellulitis of unspecified part of limb: Secondary | ICD-10-CM

## 2013-01-28 LAB — GLUCOSE, CAPILLARY
Glucose-Capillary: 193 mg/dL — ABNORMAL HIGH (ref 70–99)
Glucose-Capillary: 215 mg/dL — ABNORMAL HIGH (ref 70–99)
Glucose-Capillary: 264 mg/dL — ABNORMAL HIGH (ref 70–99)
Glucose-Capillary: 313 mg/dL — ABNORMAL HIGH (ref 70–99)

## 2013-01-28 MED ORDER — CEFAZOLIN SODIUM 1-5 GM-% IV SOLN
1.0000 g | Freq: Three times a day (TID) | INTRAVENOUS | Status: DC
  Filled 2013-01-28 (×3): qty 50

## 2013-01-28 MED ORDER — OXYCODONE HCL 5 MG PO TABS
5.0000 mg | ORAL_TABLET | Freq: Four times a day (QID) | ORAL | Status: DC | PRN
Start: 2013-01-28 — End: 2013-02-01
  Administered 2013-01-28: 5 mg via ORAL
  Administered 2013-01-28 – 2013-01-29 (×3): 10 mg via ORAL
  Administered 2013-01-29: 5 mg via ORAL
  Administered 2013-01-30 – 2013-02-01 (×4): 10 mg via ORAL
  Filled 2013-01-28 (×3): qty 2
  Filled 2013-01-28: qty 1
  Filled 2013-01-28 (×4): qty 2
  Filled 2013-01-28: qty 1

## 2013-01-28 MED ORDER — PANTOPRAZOLE SODIUM 40 MG PO TBEC
80.0000 mg | DELAYED_RELEASE_TABLET | Freq: Every day | ORAL | Status: DC
  Administered 2013-01-29 – 2013-02-01 (×4): 80 mg via ORAL
  Filled 2013-01-28 (×4): qty 2

## 2013-01-28 MED ORDER — LEVALBUTEROL HCL 0.63 MG/3ML IN NEBU
0.6300 mg | INHALATION_SOLUTION | Freq: Two times a day (BID) | RESPIRATORY_TRACT | Status: DC
  Administered 2013-01-29 – 2013-02-01 (×7): 0.63 mg via RESPIRATORY_TRACT
  Filled 2013-01-28 (×9): qty 3

## 2013-01-28 MED ORDER — CEFAZOLIN SODIUM-DEXTROSE 2-3 GM-% IV SOLR
2.0000 g | Freq: Three times a day (TID) | INTRAVENOUS | Status: DC
  Administered 2013-01-28 – 2013-02-01 (×12): 2 g via INTRAVENOUS
  Filled 2013-01-28 (×15): qty 50

## 2013-01-28 MED ORDER — IPRATROPIUM BROMIDE 0.02 % IN SOLN
0.5000 mg | Freq: Two times a day (BID) | RESPIRATORY_TRACT | Status: DC
  Administered 2013-01-29 – 2013-02-01 (×7): 0.5 mg via RESPIRATORY_TRACT
  Filled 2013-01-28 (×6): qty 2.5

## 2013-01-28 NOTE — Consult Note (Signed)
Reason for Consult: Bilateral foot ulcers Referring Physician: Daiva Eves  Jennifer [name] is an 52 y.o. female.  HPI: Patient was scheduled at foot clinic. Admitted with cellulitis fever.  Past Medical History  Diagnosis Date  . Depression   . Diabetes mellitus   . GERD (gastroesophageal reflux disease)   . Hyperlipidemia   . Hypertension   . Chronic kidney disease   . Glaucoma   . COPD (chronic obstructive pulmonary disease)   . Chronic shoulder pain   . Chronic back pain   . PVD (peripheral vascular disease) with claudication     Stents to bilateral common iliac arteries, on chronic plavix  . Tinnitus   . Peripheral neuropathy   . Fibromyalgia   . Colon polyp   . Diverticula of colon   . Diverticulosis   . Asthma     Past Surgical History  Procedure Date  . Abdominal hysterectomy   . Tubal ligation   . Rotator cuff repair     bilateral  . Breast biopsy     multiple-benign per pt  . Wrist surgery     right  . Ganglion cyst excision     multiple  . Other surgical history     stents in lower ext    History reviewed. No pertinent family history.  Social History:  reports that she has been smoking Cigarettes.  She has a 60 pack-year smoking history. Her smokeless tobacco use includes Snuff. She reports that she does not drink alcohol or use illicit drugs.  Allergies:  Allergies  Allergen Reactions  . 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 Morphine Sulfate     REACTION: hives  . Vicodin (Hydrocodone-Acetaminophen) Itching    Medications: I have reviewed the patient's current medications.  Results for orders placed during the hospital encounter of 01/25/13 (from the past 48 hour(s))  GLUCOSE, CAPILLARY     Status: Abnormal   Collection Time   01/26/13  8:27 PM      Component Value Range Comment   Glucose-Capillary 221 (*) 70 - 99 mg/dL    Comment 1 Notify RN     HIV ANTIBODY (ROUTINE TESTING)      Status: Normal   Collection Time   01/27/13  5:33 AM      Component Value Range Comment   HIV NON REACTIVE  NON REACTIVE   SEDIMENTATION RATE     Status: Abnormal   Collection Time   01/27/13  5:33 AM      Component Value Range Comment   Sed Rate 84 (*) 0 - 22 mm/hr   C-REACTIVE PROTEIN     Status: Abnormal   Collection Time   01/27/13  5:33 AM      Component Value Range Comment   CRP 31.2 (*) <0.60 mg/dL Result confirmed by automatic dilution.  GLUCOSE, CAPILLARY     Status: Abnormal   Collection Time   01/27/13  7:35 AM      Component Value Range Comment   Glucose-Capillary 369 (*) 70 - 99 mg/dL   GLUCOSE, CAPILLARY     Status: Abnormal   Collection Time   01/27/13  1:54 PM      Component Value Range Comment   Glucose-Capillary 170 (*) 70 - 99 mg/dL   CULTURE, BLOOD (ROUTINE X 2)     Status: Normal (Preliminary result)   Collection Time   01/27/13  2:55 PM  Component Value Range Comment   Specimen Description BLOOD LEFT ARM      Special Requests BOTTLES DRAWN AEROBIC ONLY 10CC      Culture  Setup Time 01/27/2013 22:01      Culture        Value:        BLOOD CULTURE RECEIVED NO GROWTH TO DATE CULTURE WILL BE HELD FOR 5 DAYS BEFORE ISSUING A FINAL NEGATIVE REPORT   Report Status PENDING     CULTURE, BLOOD (ROUTINE X 2)     Status: Normal (Preliminary result)   Collection Time   01/27/13  3:05 PM      Component Value Range Comment   Specimen Description BLOOD LEFT HAND      Special Requests BOTTLES DRAWN AEROBIC ONLY 10CC      Culture  Setup Time 01/27/2013 22:01      Culture        Value:        BLOOD CULTURE RECEIVED NO GROWTH TO DATE CULTURE WILL BE HELD FOR 5 DAYS BEFORE ISSUING A FINAL NEGATIVE REPORT   Report Status PENDING     GLUCOSE, CAPILLARY     Status: Abnormal   Collection Time   01/27/13  4:51 PM      Component Value Range Comment   Glucose-Capillary 226 (*) 70 - 99 mg/dL   GLUCOSE, CAPILLARY     Status: Abnormal   Collection Time   01/27/13  8:42 PM      Component  Value Range Comment   Glucose-Capillary 232 (*) 70 - 99 mg/dL    Comment 1 Notify RN     GLUCOSE, CAPILLARY     Status: Abnormal   Collection Time   01/28/13  7:52 AM      Component Value Range Comment   Glucose-Capillary 193 (*) 70 - 99 mg/dL    Comment 1 Documented in Chart      Comment 2 Notify RN     GLUCOSE, CAPILLARY     Status: Abnormal   Collection Time   01/28/13 11:34 AM      Component Value Range Comment   Glucose-Capillary 215 (*) 70 - 99 mg/dL    Comment 1 Documented in Chart      Comment 2 Notify RN     GLUCOSE, CAPILLARY     Status: Abnormal   Collection Time   01/28/13  4:49 PM      Component Value Range Comment   Glucose-Capillary 264 (*) 70 - 99 mg/dL    Comment 1 Documented in Chart      Comment 2 Notify RN       Mr Foot Right W Wo Contrast  01/27/2013  *RADIOLOGY REPORT*  Clinical Data: Right foot and ankle edema, erythema and medial plantar wound.  MRI OF THE RIGHT FOREFOOT WITHOUT AND WITH CONTRAST  Technique:  Multiplanar, multisequence MR imaging was performed both before and after administration of intravenous contrast.  Contrast: 20mL MULTIHANCE GADOBENATE DIMEGLUMINE 529 MG/ML IV SOLN  Comparison: Toe radiographs 07/09/2012.  MRI 07/30/2010.  Findings: The right forefoot exam quality is significantly better than that of the opposite foot.  There is no significant motion artifact.  Examination includes most of the forefoot and midfoot.  There is diffuse some dorsal subcutaneous edema.  There is edema and enhancement throughout the flexor musculature. The muscles are diffusely atrophied on T1-weighted images.  No focal fluid collections are identified.  Again demonstrated is a well-circumscribed 3.5 cm lipoma plantar to the  first and second metatarsals.  This appears unchanged.  There is no associated osseous erosion.  The distal fourth phalanx appears chronically eroded, not grossly changed from the radiographs of 7 months prior.  No acute bone destruction or suspicious  marrow enhancement is identified to suggest acute osteomyelitis.  There is diffuse nodularity of the plantar fascia within the midfoot and forefoot which is similar to the prior examination and suspicious for plantar fibromatosis.  IMPRESSION:  1.  No evidence of right forefoot soft tissue abscess or acute osteomyelitis. 2.  Stable erosion of the distal fourth phalanx from radiographs of 07/09/2012.  This may indicate chronic osteomyelitis. 3.  Stable chronic myopathic changes and lipoma within the forefoot. 4.  Stable nodularity of the plantar fascia suspicious for plantar fibromatosis.   Original Report Authenticated By: Carey Bullocks, M.D.    Mr Foot Left W Wo Contrast  01/27/2013  *RADIOLOGY REPORT*  Clinical Data: Left foot edema and erythema with medial plantar wound.  MRI OF THE LEFT FOREFOOT WITHOUT AND WITH CONTRAST  Technique:  Multiplanar, multisequence MR imaging was performed both before and after administration of intravenous contrast.  Contrast:  20 ml Multihance.  Comparison: Radiographs 08/11/2010.  Findings: Study is moderately motion degraded.  A variably sized field of view includes most of the forefoot and midfoot.  The distal toes and hind foot are incompletely visualized.  There is mild dorsal subcutaneous edema.  There is edema and enhancement throughout the flexor musculature of the forefoot.  No focal fluid collection is identified. There appears to be focal soft tissue enhancement within the plantar aspect of the second webspace.  This area is not well visualized on T2-weighted images.  There is irregular nodular thickening of the medial cord of the plantar fascia within the forefoot.  No discontinuity is identified.  On the sagittal images, the medial cord is mildly thickened adjacent to a plantar calcaneal spur.  There is no evidence of bone destruction or suspicious enhancement to suggest osteomyelitis.  Mild degenerative changes are present at the Lisfranc joint.  IMPRESSION:  1.   No evidence of soft tissue abscess or osteomyelitis. 2.  Nonspecific forefoot muscular edema and enhancement could represent myositis or diabetic myopathy. 3.  Focal enhancement within the plantar aspect of the second webspace.  This could indicate a Morton's neuroma. 4.  Nodular thickening of the medial cord of the plantar fascia is suspicious for plantar fibromatosis.   Original Report Authenticated By: Carey Bullocks, M.D.     Review of Systems  Constitutional: Positive for fever.  HENT: Negative.   Eyes: Negative.   Respiratory: Negative.   Cardiovascular: Negative.   Gastrointestinal: Negative.   Genitourinary: Negative.   Musculoskeletal: Positive for joint pain.  Skin: Negative.   Neurological: Negative.   Endo/Heme/Allergies: Negative.   Psychiatric/Behavioral: Negative.   All other systems reviewed and are negative.   Blood pressure 125/74, pulse 59, temperature 97.7 F (36.5 C), temperature source Oral, resp. rate 20, height 5\' 7"  (1.702 m), weight 117.209 kg (258 lb 6.4 oz), SpO2 95.00%. Physical Exam  Constitutional: She is oriented to person, place, and time. She appears well-developed.  HENT:  Head: Normocephalic.  Eyes: Pupils are equal, round, and reactive to light.  Neck: Normal range of motion.  Cardiovascular: Normal rate.   Respiratory: Effort normal.  GI: Soft.  Musculoskeletal:       Right foot medial arch with blister and open wound No erythema. No purulent drainage. 2+DP Good flexion and extention. No  sensation stocking glove foot.  Left foot plantar ulcer 3x6 cm necrotic no purulence no sensation. 1+ DP PT.  Neurological: She is alert and oriented to person, place, and time.  Skin: Skin is warm and dry.  Psychiatric: She has a normal mood and affect.    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 Necrotic ulcer left. No deep infection by MRI. Pulses present. Will consult foot specialist for OPT.  Recommend wound care clinic. IV Ab until culture sensitivities available. Then PO. Will need debridement. Await ABI's.  Sava Proby C 01/28/2013, 5:21 PM

## 2013-01-28 NOTE — Progress Notes (Addendum)
Subjective: Pt doing well overnight. No acute events overnight. MRI showed no underlying OM or abscess.   Objective: Vital signs in last 24 hours: Filed Vitals:   01/27/13 2141 01/28/13 0230 01/28/13 0457 01/28/13 0929  BP:   151/82   Pulse: 89 98 58   Temp:   97.6 F (36.4 C)   TempSrc:   Oral   Resp: 20 24 21    Height:      Weight:   258 lb 6.4 oz (117.209 kg)   SpO2: 95% 96% 97% 96%   Weight change: -1 lb 8 oz (-0.68 kg)  Intake/Output Summary (Last 24 hours) at 01/28/13 1032 Last data filed at 01/28/13 0800  Gross per 24 hour  Intake    360 ml  Output      0 ml  Net    360 ml   General: sitting at edge of bed HEENT: PERRL, EOMI, no scleral icterus Cardiac: RRR, no rubs, murmurs or gallops Pulm: clear to auscultation bilaterally, moving normal volumes of air Abd: soft, nontender, nondistended, BS present Ext: warm and well perfused, no pedal edema, r leg hyperkeratosis hyperpigmented scaling lesion along frontal shin, no sensation in feet bilaterally, black escar scab on left foot with some surrounding erythema and serosanguinous drainage, right foot with underlying ulcer some extending purulence up to lateral malleolus.  Neuro: alert and oriented X3, cranial nerves II-XII grossly intact  Lab Results: Basic Metabolic Panel:  Lab 01/26/13 4540 01/25/13 2142  NA 132* 131*  K 3.6 3.0*  CL 92* 91*  CO2 30 26  GLUCOSE 379* 367*  BUN 9 9  CREATININE 0.92 0.78  CALCIUM 8.4 8.7  MG -- --  PHOS -- --   Liver Function Tests:  Lab 01/26/13 0030  AST 32  ALT 32  ALKPHOS 94  BILITOT 0.8  PROT 6.9  ALBUMIN 3.1*   CBC:  Lab 01/26/13 0451 01/25/13 2142  WBC 14.7* 12.2*  NEUTROABS -- 9.7*  HGB 12.3 13.6  HCT 37.3 40.4  MCV 95.2 94.8  PLT 110* 113*   Cardiac Enzymes:  Lab 01/26/13 0451  CKTOTAL --  CKMB --  CKMBINDEX --  TROPONINI <0.30   BNP:  Lab 01/26/13 0028  PROBNP 727.3*   CBG:  Lab 01/28/13 0752 01/27/13 2042 01/27/13 1651 01/27/13 1354  01/27/13 0735 01/26/13 2027  GLUCAP 193* 232* 226* 170* 369* 221*   Hemoglobin A1C:  Lab 01/26/13 0030  HGBA1C 9.5*   Thyroid Function Tests:  Lab 01/26/13 0156  TSH 0.815  T4TOTAL --  FREET4 --  T3FREE --  THYROIDAB --   Anemia Panel:  Lab 01/26/13 0156  VITAMINB12 614  FOLATE --  FERRITIN --  TIBC --  IRON --  RETICCTPCT --   Urine Drug Screen: Drugs of Abuse     Component Value Date/Time   LABOPIA NONE DETECTED 01/26/2013 0036   LABOPIA SEE NOTE 07/21/2012 1631   COCAINSCRNUR NONE DETECTED 01/26/2013 0036   COCAINSCRNUR NEG 07/21/2012 1631   LABBENZ NONE DETECTED 01/26/2013 0036   LABBENZ NEG 07/21/2012 1631   LABBENZ NEG 06/28/2010 2130   AMPHETMU NONE DETECTED 01/26/2013 0036   AMPHETMU NEG 06/28/2010 2130   THCU NONE DETECTED 01/26/2013 0036   LABBARB NONE DETECTED 01/26/2013 0036   LABBARB NEG 07/21/2012 1631    Urinalysis:  Lab 01/26/13 0036  COLORURINE YELLOW  LABSPEC 1.033*  PHURINE 6.0  GLUCOSEU >1000*  HGBUR NEGATIVE  BILIRUBINUR NEGATIVE  KETONESUR 15*  PROTEINUR NEGATIVE  UROBILINOGEN 1.0  NITRITE NEGATIVE  LEUKOCYTESUR NEGATIVE   Micro Results: Recent Results (from the past 240 hour(s))  CULTURE, BLOOD (ROUTINE X 2)     Status: Normal (Preliminary result)   Collection Time   01/26/13 12:30 AM      Component Value Range Status Comment   Specimen Description BLOOD ARM LEFT   Final    Special Requests BOTTLES DRAWN AEROBIC AND ANAEROBIC 10CC   Final    Culture  Setup Time 01/26/2013 03:50   Final    Culture     Final    Value: GRAM POSITIVE COCCI IN PAIRS     Note: Gram Stain Report Called to,Read Back By and Verified With: CHRISTY JOHNSON ON 01/26/2013 AT 6:50P BY WILEJ   Report Status PENDING   Incomplete   CULTURE, BLOOD (ROUTINE X 2)     Status: Normal (Preliminary result)   Collection Time   01/26/13 12:45 AM      Component Value Range Status Comment   Specimen Description BLOOD HAND RIGHT   Final    Special Requests BOTTLES DRAWN AEROBIC AND  ANAEROBIC 10CC   Final    Culture  Setup Time 01/26/2013 03:50   Final    Culture     Final    Value: GRAM POSITIVE COCCI IN PAIRS     Note: Gram Stain Report Called to,Read Back By and Verified With: CHRISTY JOHNSON ON 01/26/2013 AT 6:50P BY WILEJ   Report Status PENDING   Incomplete   CULTURE, BLOOD (ROUTINE X 2)     Status: Normal (Preliminary result)   Collection Time   01/27/13  2:55 PM      Component Value Range Status Comment   Specimen Description BLOOD LEFT ARM   Final    Special Requests BOTTLES DRAWN AEROBIC ONLY 10CC   Final    Culture  Setup Time 01/27/2013 22:01   Final    Culture     Final    Value:        BLOOD CULTURE RECEIVED NO GROWTH TO DATE CULTURE WILL BE HELD FOR 5 DAYS BEFORE ISSUING A FINAL NEGATIVE REPORT   Report Status PENDING   Incomplete   CULTURE, BLOOD (ROUTINE X 2)     Status: Normal (Preliminary result)   Collection Time   01/27/13  3:05 PM      Component Value Range Status Comment   Specimen Description BLOOD LEFT HAND   Final    Special Requests BOTTLES DRAWN AEROBIC ONLY 10CC   Final    Culture  Setup Time 01/27/2013 22:01   Final    Culture     Final    Value:        BLOOD CULTURE RECEIVED NO GROWTH TO DATE CULTURE WILL BE HELD FOR 5 DAYS BEFORE ISSUING A FINAL NEGATIVE REPORT   Report Status PENDING   Incomplete    Studies/Results: Mr Foot Right W Wo Contrast  01/27/2013  *RADIOLOGY REPORT*  Clinical Data: Right foot and ankle edema, erythema and medial plantar wound.  MRI OF THE RIGHT FOREFOOT WITHOUT AND WITH CONTRAST  Technique:  Multiplanar, multisequence MR imaging was performed both before and after administration of intravenous contrast.  Contrast: 20mL MULTIHANCE GADOBENATE DIMEGLUMINE 529 MG/ML IV SOLN  Comparison: Toe radiographs 07/09/2012.  MRI 07/30/2010.  Findings: The right forefoot exam quality is significantly better than that of the opposite foot.  There is no significant motion artifact.  Examination includes most of the forefoot and  midfoot.  There is diffuse  some dorsal subcutaneous edema.  There is edema and enhancement throughout the flexor musculature. The muscles are diffusely atrophied on T1-weighted images.  No focal fluid collections are identified.  Again demonstrated is a well-circumscribed 3.5 cm lipoma plantar to the first and second metatarsals.  This appears unchanged.  There is no associated osseous erosion.  The distal fourth phalanx appears chronically eroded, not grossly changed from the radiographs of 7 months prior.  No acute bone destruction or suspicious marrow enhancement is identified to suggest acute osteomyelitis.  There is diffuse nodularity of the plantar fascia within the midfoot and forefoot which is similar to the prior examination and suspicious for plantar fibromatosis.  IMPRESSION:  1.  No evidence of right forefoot soft tissue abscess or acute osteomyelitis. 2.  Stable erosion of the distal fourth phalanx from radiographs of 07/09/2012.  This may indicate chronic osteomyelitis. 3.  Stable chronic myopathic changes and lipoma within the forefoot. 4.  Stable nodularity of the plantar fascia suspicious for plantar fibromatosis.   Original Report Authenticated By: Carey Bullocks, M.D.    Mr Foot Left W Wo Contrast  01/27/2013  *RADIOLOGY REPORT*  Clinical Data: Left foot edema and erythema with medial plantar wound.  MRI OF THE LEFT FOREFOOT WITHOUT AND WITH CONTRAST  Technique:  Multiplanar, multisequence MR imaging was performed both before and after administration of intravenous contrast.  Contrast:  20 ml Multihance.  Comparison: Radiographs 08/11/2010.  Findings: Study is moderately motion degraded.  A variably sized field of view includes most of the forefoot and midfoot.  The distal toes and hind foot are incompletely visualized.  There is mild dorsal subcutaneous edema.  There is edema and enhancement throughout the flexor musculature of the forefoot.  No focal fluid collection is identified. There  appears to be focal soft tissue enhancement within the plantar aspect of the second webspace.  This area is not well visualized on T2-weighted images.  There is irregular nodular thickening of the medial cord of the plantar fascia within the forefoot.  No discontinuity is identified.  On the sagittal images, the medial cord is mildly thickened adjacent to a plantar calcaneal spur.  There is no evidence of bone destruction or suspicious enhancement to suggest osteomyelitis.  Mild degenerative changes are present at the Lisfranc joint.  IMPRESSION:  1.  No evidence of soft tissue abscess or osteomyelitis. 2.  Nonspecific forefoot muscular edema and enhancement could represent myositis or diabetic myopathy. 3.  Focal enhancement within the plantar aspect of the second webspace.  This could indicate a Morton's neuroma. 4.  Nodular thickening of the medial cord of the plantar fascia is suspicious for plantar fibromatosis.   Original Report Authenticated By: Carey Bullocks, M.D.    Medications: I have reviewed the patient's current medications. Scheduled Meds:    . aspirin  81 mg Oral Daily  . atenolol  100 mg Oral Daily  . benazepril  40 mg Oral Daily  . cefTRIAXone (ROCEPHIN)  IV  2 g Intravenous Q24H  . clopidogrel  75 mg Oral Daily  . escitalopram  20 mg Oral Daily  . heparin  5,000 Units Subcutaneous Q8H  . hydrochlorothiazide  25 mg Oral Daily  . insulin aspart  0-15 Units Subcutaneous TID WC  . insulin aspart  0-5 Units Subcutaneous QHS  . insulin aspart protamine-insulin aspart  125 Units Subcutaneous Q breakfast  . insulin aspart protamine-insulin aspart  65 Units Subcutaneous Q supper  . ipratropium  0.5 mg Nebulization Q6H  .  levalbuterol  0.63 mg Nebulization Q6H  . mometasone-formoterol  2 puff Inhalation BID  . nicotine  14 mg Transdermal Q24H  . pantoprazole  40 mg Oral Daily  . predniSONE  60 mg Oral Q breakfast  . pregabalin  100 mg Oral TID  . simvastatin  20 mg Oral q1800  .  sodium chloride  3 mL Intravenous Q12H  . vancomycin  1,000 mg Intravenous Q12H   Continuous Infusions:  PRN Meds:.sodium chloride, acetaminophen, acetaminophen, albuterol, benzonatate, ondansetron (ZOFRAN) IV, ondansetron, oxyCODONE, sodium chloride Assessment/Plan: Ms. Pendry 52 yo woman pmh DM poorly controlled and severe sequale including foot ulcers, PVD s/p stenting presents with recent falls found to be bacteremic and concerning bilateral foot ulcers.   1. G+ cocci bacteremia: pt was slightly hypotensive overnight and responded to fluid rescitation. This maybe etiology to recent falls. Pt was started on vanco overnight when report of bacteremia: -repeat BCx NGTD -BCx G+ cocci pending speciation and sensitivities -TEE scheduled for Monday 01/31/13 -vanco and ceftriaxone continued  2. DM foot ulcers: WOC evaluation concerning for underlying abscess/necrosis and may need debridement.  -MRI shows no underlying OM or abscess needing debridement  Dispo: Disposition is deferred at this time, awaiting improvement of current medical problems.  Anticipated discharge in approximately 3-4 day(s).   The patient does have a current PCP Blanch Media, MD), therefore will be requiring OPC follow-up after discharge.   The patient does not have transportation limitations that hinder transportation to clinic appointments.  .Services Needed at time of discharge: Y = Yes, Blank = No PT:   OT:   RN:   Equipment:   Other:     LOS: 3 days   Christen Bame 01/28/2013, 10:32 AM [phone]      [facility]   Internal Medicine Teaching Service Attending Note Date: 01/28/2013  Patient name: FLONNIE WIERMAN  Medical record number: [id number]  Date of birth: Mar 10, 1961    This patient has been seen and discussed with the house staff. Please see their note for complete details. I concur with their findings with the following additions/corrections:  Mrs Crotteau examined  this am and she is comfortable. While her MRI is not showing osteomyelitis I am very disturbed by appearance of her feet which are darkened with almost mottled appearance.   She has now been found to have polymicrobial bacteremia with Staphylococcus Aureus and Group B streptococcus in 2/2 cultures.  The feet esp the right one remains PRIME suspects for source.  Repeat blood cultures are pending.  TTE no vegetations, but will need TEE and Cardiology called  I also think she clearly needs a formal Orthopedic consult.  We  spent greater than 45 minutes with the patient including greater than 50% of time in face to face counsel of the patient and in coordination of their care.   Paulette Blanch Dam 01/28/2013, 2:10 PM

## 2013-01-28 NOTE — Progress Notes (Signed)
Physical Therapy Treatment Patient Details Name: Jennifer [name] MRN: [id number] DOB: Feb 05, 1961 Today's Date: 01/28/2013 Time: 2440-1027 PT Time Calculation (min): 40 min  PT Assessment / Plan / Recommendation Comments on Treatment Session  Pt s/p falls with vertigo with vertigo now resolved after treatment yesterday.  Could not elicit any vertigo symptoms today.  Pt very excited that dizziness is so much improved.  Pt ambulated in hall a long distance.  Balance is slightly impaired therefore recommend use of RW at all times initially on d/c.  Also will need 3N1 as well as HHPT and HHOT f/u.      Follow Up Recommendations  Home health PT;Supervision - Intermittent                 Equipment Recommendations  Rolling walker with 5" wheels (3N1)        Frequency Min 3X/week   Plan Discharge plan needs to be updated;Frequency remains appropriate    Precautions / Restrictions Precautions Precautions: Fall Restrictions Weight Bearing Restrictions: No   Pertinent Vitals/Pain VSS, No pain    Mobility  Bed Mobility Bed Mobility: Not assessed Transfers Transfers: Sit to Stand;Stand to Sit Sit to Stand: 6: Modified independent (Device/Increase time);With upper extremity assist;From chair/3-in-1 Stand to Sit: 6: Modified independent (Device/Increase time);With upper extremity assist;With armrests;To chair/3-in-1 Details for Transfer Assistance: Good technique today with RW Ambulation/Gait Ambulation/Gait Assistance: 5: Supervision Ambulation Distance (Feet): 450 Feet Assistive device: Rolling walker Ambulation/Gait Assistance Details: Pt ambulated with RW in halls with good safety awareness with RW.  Much steadier and no LOB.  Also no help needed to steer RW and no cues to stay close to RW.  Also ambulated about 20 feet without RW with better steadiness and min guard assist.  Steps much more coordinated today.   Gait Pattern: Decreased stride length;Step-through pattern;Wide base of  support Gait velocity: improved Stairs: No Wheelchair Mobility Wheelchair Mobility: No        PT Goals Acute Rehab PT Goals PT Goal: Sit to Stand - Progress: Progressing toward goal PT Transfer Goal: Bed to Chair/Chair to Bed - Progress: Progressing toward goal PT Goal: Ambulate - Progress: Progressing toward goal Additional Goals PT Goal: Additional Goal #1 - Progress: Met  Visit Information  Last PT Received On: 01/28/13 Assistance Needed: +1    Subjective Data  Subjective: "I am so much better.  Want to get out and walk."   Cognition  Cognition Overall Cognitive Status: Appears within functional limits for tasks assessed/performed Arousal/Alertness: Awake/alert Orientation Level: Appears intact for tasks assessed Behavior During Session: [facility] for tasks performed    Balance  Dynamic Sitting Balance Dynamic Sitting - Balance Support: Feet supported;No upper extremity supported Dynamic Sitting - Level of Assistance: 6: Modified independent (Device/Increase time) Dynamic Sitting - Balance Activities: Lateral lean/weight shifting;Forward lean/weight shifting;Reaching across midline Static Standing Balance Static Standing - Balance Support: Bilateral upper extremity supported;During functional activity Static Standing - Level of Assistance: 6: Modified independent (Device/Increase time) Static Standing - Comment/# of Minutes: 2 minutes - needed no steadying assist today wtih RW Standardized Balance Assessment Standardized Balance Assessment: Dynamic Gait Index Dynamic Gait Index Level Surface: Normal Change in Gait Speed: Mild Impairment Gait with Horizontal Head Turns: Normal Gait with Vertical Head Turns: Normal Gait and Pivot Turn: Mild Impairment Step Over Obstacle: Mild Impairment Step Around Obstacles: Normal Steps: Moderate Impairment Total Score: 19  High Level Balance High Level Balance Comments: Pt scored 19/24 on DGI suggestive of risk of falls.  Recommend  that pt get a RW for home use.  Pt agrees.    End of Session PT - End of Session Equipment Utilized During Treatment: Gait belt Activity Tolerance: Patient tolerated treatment well Patient left: in bed;with call bell/phone within reach (on edge of bed) Nurse Communication: Mobility status       INGOLD,Mykenzie Ebanks 01/28/2013, 9:42 AM  [facility] [phone] (pager)

## 2013-01-28 NOTE — Progress Notes (Signed)
    [facility]  Staphylococcus aureus bacteremia (SAB) is associated with a high rate of complications and mortality.  Specific aspects of clinical management are critical to optimizing the outcome of patients with SAB.  Therefore, the [facility] Antimicrobial Management Team ([facility]) have initiated an intervention aimed at improving the management of SAB at [facility].  To do so, Infectious Diseases Consultants are providing evidence-based recommendations for the management of all patients with SAB.  The specific recommendations for this patient are marked "X" in this document.  Recommended [x  ]  Completed [January 27 2013]  Perform follow-up blood cultures (even if the patient is afebrile) to ensure clearance of bacteremia.  Recommended [  ]  Completed [date]   Remove vascular catheter, and obtain follow-up cultures after removal of catheter  NA  Recommended Arly.Keller  ]  Completed [tte ON 01/26/13 AND CONSULT FOR TEE ON MONDAY]   Perform echocardiography to evaluate for endocarditis (transthoracic ECHO is 40-50% sensitive, TEE is > 90% sensitive).*  Please keep in mind, that neither test can definitively EXCLUDE endocarditis, and that should clinical suspicion remain high for endocarditis the patient should then still be treated with an "endocarditis" duration of therapy = 6 weeks  WE CONSULTED CARDIOLOGY FOR TEE ON MONDAY  Recommended [  ]  Completed [date]   Consult electrophysiologist to evaluate implanted cardiac device (pacemaker, ICD)  NA  Recommended [ X ]  Completed [date]   Ensure source control.  Have all abscesses been drained effectively? Have deep seeded infections (septic joints or osteomyelitis) had appropriate surgical debridement?  ORTHOPEDICS CONSULTED FORMALLY TODAY.   Recommended [ X ]  Completed [date]   Investigate for "metastatic" sites of infection.   Does the patient have ANY symptom or physical exam finding that would suggest a deeper  infection (back or neck pain that may be suggestive of vertebral osteomyelitis or epidural abscess, muscle pain that could be a symptom of pyomyositis)?  Keep in mind that for deep seeded infections MRI imaging with contrast is preferred rather than other often insensitive tests such as plain x-rays, especially early in a patient's presentation.   See above no other source besides feet identfied so far. WILL Question about ANY pain anywhere in body to look for metastatic sources EVERY DAY  Recommended [  X ]  Completed [2714]  Change antibiotic regimen to [facility] AND CEFAZOLIN__.  (If on vancomycin, goal trough should be 15 - 20 mg/L)  [ x ]  Estimated duration of IV  Antibiotic therapy:   6 WEEKS  [ X ]  Consult case management for probable prolonged outpatient intravenous antibiotic therapy.  Will do this over weekend once we have ensured clearnace of bacteremia and source control.  DO NOT place PICC Until we are sure we have sterilized blood.

## 2013-01-28 NOTE — Progress Notes (Signed)
Resident Addendum to Medical Student Note   I have seen and examined the patient, and agree with the the medical student assessment and plan outlined above. Please see my brief note for additional details.  Length of Stay: 3  Jennifer [name] 01/28/2013, 2:51 PM [phone]

## 2013-01-28 NOTE — Progress Notes (Signed)
VASCULAR LAB PRELIMINARY  ARTERIAL  ABI completed:    RIGHT    LEFT    PRESSURE WAVEFORM  PRESSURE WAVEFORM  BRACHIAL 175 Triphasic BRACHIAL 180 Triphasic  DP 155 Triphasic DP 151 Biphasic         PT 168 Triphasic PT 166 Triphasic                  RIGHT LEFT  ABI 0.93 0.92   ABIs and Doppler waveforms are within normal limits bilaterally at rest.  Lajuane Leatham, RVS 01/28/2013, 5:48 PM

## 2013-01-28 NOTE — Progress Notes (Signed)
Marland KitchenANTIBIOTIC CONSULT NOTE - FOLLOW UP  Pharmacy Consult for Ancef Indication: Staph aureus bacteremia  Allergies  Allergen Reactions  . 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 Morphine Sulfate     REACTION: hives  . Vicodin (Hydrocodone-Acetaminophen) Itching    Patient Measurements: Height: 5\' 7"  (170.2 cm) Weight: 258 lb 6.4 oz (117.209 kg) IBW/kg (Calculated) : 61.6   Vital Signs: Temp: 97.7 F (36.5 C) (02/07 1400) Temp src: Oral (02/07 1400) BP: 125/74 mmHg (02/07 1400) Pulse Rate: 59  (02/07 1400) Intake/Output from previous day:   Intake/Output from this shift: Total I/O In: 360 [P.O.:360] Out: -   Labs:  Basename 01/26/13 0451 01/25/13 2142  WBC 14.7* 12.2*  HGB 12.3 13.6  PLT 110* 113*  LABCREA -- --  CREATININE 0.92 0.78   Estimated Creatinine Clearance: 93.6 ml/min (by C-G formula based on Cr of 0.92). No results found for this basename: VANCOTROUGH:2,VANCOPEAK:2,VANCORANDOM:2,GENTTROUGH:2,GENTPEAK:2,GENTRANDOM:2,TOBRATROUGH:2,TOBRAPEAK:2,TOBRARND:2,AMIKACINPEAK:2,AMIKACINTROU:2,AMIKACIN:2, in the last 72 hours   Microbiology: Recent Results (from the past 720 hour(s))  CULTURE, BLOOD (ROUTINE X 2)     Status: Normal (Preliminary result)   Collection Time   01/26/13 12:30 AM      Component Value Range Status Comment   Specimen Description BLOOD ARM LEFT   Final    Special Requests BOTTLES DRAWN AEROBIC AND ANAEROBIC 10CC   Final    Culture  Setup Time 01/26/2013 03:50   Final    Culture     Final    Value: STAPHYLOCOCCUS AUREUS     Note: RIFAMPIN AND GENTAMICIN SHOULD NOT BE USED AS SINGLE DRUGS FOR TREATMENT OF STAPH INFECTIONS.     GROUP B STREP(S.AGALACTIAE)ISOLATED     Note: Gram Stain Report Called to,Read Back By and Verified With: CHRISTY JOHNSON ON 01/26/2013 AT 6:50P BY WILEJ   Report Status PENDING   Incomplete   CULTURE, BLOOD (ROUTINE X 2)     Status: Normal (Preliminary result)   Collection Time   01/26/13 12:45 AM      Component Value Range Status Comment   Specimen Description BLOOD HAND RIGHT   Final    Special Requests BOTTLES DRAWN AEROBIC AND ANAEROBIC 10CC   Final    Culture  Setup Time 01/26/2013 03:50   Final    Culture     Final    Value: STAPHYLOCOCCUS AUREUS     GROUP B STREP(S.AGALACTIAE)ISOLATED     Note: Gram Stain Report Called to,Read Back By and Verified With: CHRISTY JOHNSON ON 01/26/2013 AT 6:50P BY WILEJ   Report Status PENDING   Incomplete   CULTURE, BLOOD (ROUTINE X 2)     Status: Normal (Preliminary result)   Collection Time   01/27/13  2:55 PM      Component Value Range Status Comment   Specimen Description BLOOD LEFT ARM   Final    Special Requests BOTTLES DRAWN AEROBIC ONLY 10CC   Final    Culture  Setup Time 01/27/2013 22:01   Final    Culture     Final    Value:        BLOOD CULTURE RECEIVED NO GROWTH TO DATE CULTURE WILL BE HELD FOR 5 DAYS BEFORE ISSUING A FINAL NEGATIVE REPORT   Report Status PENDING   Incomplete   CULTURE, BLOOD (ROUTINE X 2)     Status: Normal (Preliminary result)   Collection Time   01/27/13  3:05 PM      Component Value  Range Status Comment   Specimen Description BLOOD LEFT HAND   Final    Special Requests BOTTLES DRAWN AEROBIC ONLY 10CC   Final    Culture  Setup Time 01/27/2013 22:01   Final    Culture     Final    Value:        BLOOD CULTURE RECEIVED NO GROWTH TO DATE CULTURE WILL BE HELD FOR 5 DAYS BEFORE ISSUING A FINAL NEGATIVE REPORT   Report Status PENDING   Incomplete     Anti-infectives     Start     Dose/Rate Route Frequency Ordered Stop   01/28/13 1630   ceFAZolin (ANCEF) IVPB 1 g/50 mL premix  Status:  Discontinued        1 g 100 mL/hr over 30 Minutes Intravenous 3 times per day 01/28/13 1618 01/28/13 1620   01/28/13 1630   ceFAZolin (ANCEF) IVPB 2 g/50 mL premix        2 g 100 mL/hr over 30 Minutes Intravenous 3 times per day 01/28/13 1620     01/27/13 1230   cefTRIAXone (ROCEPHIN) 2 g in  dextrose 5 % 50 mL IVPB  Status:  Discontinued        2 g 100 mL/hr over 30 Minutes Intravenous Every 24 hours 01/27/13 1120 01/28/13 1527   01/27/13 0800   vancomycin (VANCOCIN) IVPB 1000 mg/200 mL premix        1,000 mg 200 mL/hr over 60 Minutes Intravenous Every 12 hours 01/26/13 2037     01/26/13 2045   vancomycin (VANCOCIN) 2,500 mg in sodium chloride 0.9 % 500 mL IVPB        2,500 mg 250 mL/hr over 120 Minutes Intravenous  Once 01/26/13 2037 01/27/13 0018   01/26/13 0230   doxycycline (VIBRA-TABS) tablet 100 mg  Status:  Discontinued        100 mg Oral Every 12 hours 01/26/13 0227 01/26/13 1238   01/26/13 0015   vancomycin (VANCOCIN) IVPB 1000 mg/200 mL premix        1,000 mg 200 mL/hr over 60 Minutes Intravenous  Once 01/26/13 0003 01/26/13 0155          Assessment: Patient known to pharmacy from Vancomycin dosing. Noted ID MD recommends TEE on Monday to r/o endocarditis (2D ECHO negative), and recommends starting Ancef until sensitivities are available. Patient is afebrile, WBC is elevated. Scr nml.  2/5 vancomycin>> 2/6 ceftriaxone>>2/7 2/7 cefazolin>>  Cultures: 2/5 Blood 2/2>> S.aureus, GBS 2/2 2/6 Blood x 2>> ngtd  Goal of Therapy:  Complete 4-6 wk abx course, negative blood cultures  Plan:  - Ancef 2gm IV q 8h - Will monitor cx/spec/sens, renal fn and clinical status daily.   Thanks, Shiasia Porro K. Allena Katz, PharmD, BCPS.  Clinical Pharmacist Pager [phone]. 01/28/2013 4:36 PM

## 2013-01-28 NOTE — Progress Notes (Signed)
Jennifer [name] is a 52 yo [place] American woman with h/o T2DM complicated by foot ulcers and peripheral neuropathy, PVD s/p stent in bilateral common iliac arteries, HTN, and COPD who presents with dizziness and falls.   Overnight: Patient complained of right leg pain on contact. She said that she has been dragging this leg for sometime now. She used to drag this leg prior to the stent placement, and started to drag her leg again recently. She asks if anything could be wrong with her stents.   She also mentions that she has had some substernal chest pain that occurred once after her neb treatment last night, and after breakfast. The pain is rated 10/10, and it lasts for 10 to 15 minutes. It is characterized as a chest pain that mimics something being stuck. She is able to eat and drink through it. Pain medication alleviates the pain. She denies diaphoresis, orthopnea, SOB, or DOE. She says that she have had similar episodes in the past for 5 to 6 years now, and she takes Nexium 20mg  at home. She also mentioned a sharp cramp pain in her upper left chest below her clavicle. She has brought up this pain to her doctors before, but nothing has been done.   ROS: HEENT: no blurry vision Pulm: continued cough, but easier breathing with her airway treatments GI: no constipation or diarrhea GU: no changes in urine pattern, reports polyuria  Medication:    . aspirin  81 mg Oral Daily  . atenolol  100 mg Oral Daily  . benazepril  40 mg Oral Daily  . cefTRIAXone (ROCEPHIN)  IV  2 g Intravenous Q24H  . clopidogrel  75 mg Oral Daily  . escitalopram  20 mg Oral Daily  . heparin  5,000 Units Subcutaneous Q8H  . hydrochlorothiazide  25 mg Oral Daily  . insulin aspart  0-15 Units Subcutaneous TID WC  . insulin aspart  0-5 Units Subcutaneous QHS  . insulin aspart protamine-insulin aspart  125 Units Subcutaneous Q breakfast  . insulin aspart protamine-insulin aspart  65 Units Subcutaneous Q supper  .  ipratropium  0.5 mg Nebulization Q6H  . levalbuterol  0.63 mg Nebulization Q6H  . mometasone-formoterol  2 puff Inhalation BID  . nicotine  14 mg Transdermal Q24H  . pantoprazole  40 mg Oral Daily  . predniSONE  60 mg Oral Q breakfast  . pregabalin  100 mg Oral TID  . simvastatin  20 mg Oral q1800  . sodium chloride  3 mL Intravenous Q12H  . vancomycin  1,000 mg Intravenous Q12H  PRN meds: sodium chloride, acetaminophen, acetaminophen, albuterol, benzonatate, ondansetron (ZOFRAN) IV, ondansetron, oxyCODONE, sodium chloride  Vitals: Filed Vitals:   01/28/13 0457  BP: 151/82  Pulse: 58  Temp: 97.6 F (36.4 C)  Resp: 21  Weight: 117.89kg (down from 119.65kg 2 days ago)  Fluids:  I: O: not recorded B: 360  Physical exam:  GEN: Alert, not in acute distress, obese female, lying in bed with legs elevated HEENT: PERRLA, EOMI, MMM, supple neck, left eye shows mild exophthalmos CV: RRR, no murmurs, no gallops, no rubs PULM: no WOB, not in acute respiratory distress, diminished lung sounds, symmetrical bilateral lung expansion ABDO: NDNT NEURO: A&Ox3, CNII-XII intact, numbness in LE bilaterally MSK: 4/5 strength in R leg from hip down, 5/5 in L leg,  extreme pain in R leg from hip down on contact, no change in warmth or swelling R leg from admission Extremities: 1+ pedal edema bilaterally, <2s  capillary refill on fingernail beds,   Wound Care Exam of foot ulcers 01/26/2013:  Wound type:neuropathic foot ulcer left plantar foot, neuropathic foot ulcer with associated cellulitis of the right foot  Measurement:  Left foot: 3cm x 6cm x 0cm  Right foot ulcer: 6cm x 8cm x 0.2 at wound edge  Wound bed:  Left foot: 90% hard eschar, appears to have unroofed blister here that has dried to eschar  Right foot: appears to have large blistered area with surrounding erythema that has not improved. Bulla roof intact, not able to probe under this area but does have some darkening tissue  surrounding this area also.  Drainage (amount, consistency, odor) no drainage from either site.  Labs: BMET    Component Value Date/Time   NA 132* 01/26/2013 0451   K 3.6 01/26/2013 0451   CL 92* 01/26/2013 0451   CO2 30 01/26/2013 0451   GLUCOSE 379* 01/26/2013 0451   BUN 9 01/26/2013 0451   CREATININE 0.92 01/26/2013 0451   CREATININE 0.77 05/26/2012 1626   CALCIUM 8.4 01/26/2013 0451   GFRNONAA 70* 01/26/2013 0451   GFRAA 81* 01/26/2013 0451   CBC    Component Value Date/Time   WBC 14.7* 01/26/2013 0451   RBC 3.92 01/26/2013 0451   HGB 12.3 01/26/2013 0451   HCT 37.3 01/26/2013 0451   PLT 110* 01/26/2013 0451   MCV 95.2 01/26/2013 0451   MCH 31.4 01/26/2013 0451   MCHC 33.0 01/26/2013 0451   RDW 14.1 01/26/2013 0451   LYMPHSABS 1.7 01/25/2013 2142   MONOABS 0.7 01/25/2013 2142   EOSABS 0.0 01/25/2013 2142   BASOSABS 0.0 01/25/2013 2142    01/27/2013 CRP: 31.2 Sed rate: 84  01/28/2013 Glucose: 193 Results for orders placed during the hospital encounter of 01/25/13  CULTURE, BLOOD (ROUTINE X 2)     Status: Normal (Preliminary result)   Collection Time   01/26/13 12:30 AM      Component Value Range Status Comment   Specimen Description BLOOD ARM LEFT   Final    Special Requests BOTTLES DRAWN AEROBIC AND ANAEROBIC 10CC   Final    Culture  Setup Time 01/26/2013 03:50   Final    Culture     Final    Value: GRAM POSITIVE COCCI IN PAIRS     Note: Gram Stain Report Called to,Read Back By and Verified With: CHRISTY JOHNSON ON 01/26/2013 AT 6:50P BY WILEJ   Report Status PENDING   Incomplete   CULTURE, BLOOD (ROUTINE X 2)     Status: Normal (Preliminary result)   Collection Time   01/26/13 12:45 AM      Component Value Range Status Comment   Specimen Description BLOOD HAND RIGHT   Final    Special Requests BOTTLES DRAWN AEROBIC AND ANAEROBIC 10CC   Final    Culture  Setup Time 01/26/2013 03:50   Final    Culture     Final    Value: GRAM POSITIVE COCCI IN PAIRS     Note: Gram Stain Report Called to,Read Back By and  Verified With: CHRISTY JOHNSON ON 01/26/2013 AT 6:50P BY WILEJ   Report Status PENDING   Incomplete   CULTURE, BLOOD (ROUTINE X 2)     Status: Normal (Preliminary result)   Collection Time   01/27/13  2:55 PM      Component Value Range Status Comment   Specimen Description BLOOD LEFT ARM   Final    Special Requests BOTTLES DRAWN AEROBIC ONLY 10CC  Final    Culture  Setup Time 01/27/2013 22:01   Final    Culture     Final    Value:        BLOOD CULTURE RECEIVED NO GROWTH TO DATE CULTURE WILL BE HELD FOR 5 DAYS BEFORE ISSUING A FINAL NEGATIVE REPORT   Report Status PENDING   Incomplete   CULTURE, BLOOD (ROUTINE X 2)     Status: Normal (Preliminary result)   Collection Time   01/27/13  3:05 PM      Component Value Range Status Comment   Specimen Description BLOOD LEFT HAND   Final    Special Requests BOTTLES DRAWN AEROBIC ONLY 10CC   Final    Culture  Setup Time 01/27/2013 22:01   Final    Culture     Final    Value:        BLOOD CULTURE RECEIVED NO GROWTH TO DATE CULTURE WILL BE HELD FOR 5 DAYS BEFORE ISSUING A FINAL NEGATIVE REPORT   Report Status PENDING   Incomplete    Imaging: 1. MRI of left foot Findings: Study is moderately motion degraded. A variably sized  field of view includes most of the forefoot and midfoot. The  distal toes and hind foot are incompletely visualized.  There is mild dorsal subcutaneous edema. There is edema and  enhancement throughout the flexor musculature of the forefoot. No  focal fluid collection is identified. There appears to be focal  soft tissue enhancement within the plantar aspect of the second  webspace. This area is not well visualized on T2-weighted images.  There is irregular nodular thickening of the medial cord of the  plantar fascia within the forefoot. No discontinuity is  identified. On the sagittal images, the medial cord is mildly  thickened adjacent to a plantar calcaneal spur.  There is no evidence of bone destruction or suspicious  enhancement  to suggest osteomyelitis. Mild degenerative changes are present at  the Lisfranc joint.  IMPRESSION:  1. No evidence of soft tissue abscess or osteomyelitis.  2. Nonspecific forefoot muscular edema and enhancement could  represent myositis or diabetic myopathy.  3. Focal enhancement within the plantar aspect of the second  webspace. This could indicate a Morton's neuroma.  4. Nodular thickening of the medial cord of the plantar fascia is  suspicious for plantar fibromatosis.  2. MRI of right foot Findings: The right forefoot exam quality is significantly better  than that of the opposite foot. There is no significant motion  artifact. Examination includes most of the forefoot and midfoot.  There is diffuse some dorsal subcutaneous edema. There is edema  and enhancement throughout the flexor musculature. The muscles are  diffusely atrophied on T1-weighted images. No focal fluid  collections are identified.  Again demonstrated is a well-circumscribed 3.5 cm lipoma plantar to  the first and second metatarsals. This appears unchanged. There  is no associated osseous erosion.  The distal fourth phalanx appears chronically eroded, not grossly  changed from the radiographs of 7 months prior. No acute bone  destruction or suspicious marrow enhancement is identified to  suggest acute osteomyelitis.  There is diffuse nodularity of the plantar fascia within the  midfoot and forefoot which is similar to the prior examination and  suspicious for plantar fibromatosis.  IMPRESSION:  1. No evidence of right forefoot soft tissue abscess or acute  osteomyelitis.  2. Stable erosion of the distal fourth phalanx from radiographs of  07/09/2012. This may indicate chronic osteomyelitis.  3.  Stable chronic myopathic changes and lipoma within the  forefoot.  4. Stable nodularity of the plantar fascia suspicious for plantar  fibromatosis.   Assessment and Plan: Mrs. Pichette is a 52yo  African American woman with h/o T2DM complicated by foot ulcers and peripheral neuropathy, PVD s/p stenting in bilateral common iliac, HTN, COPD, CKD and fibromylagia who presents with dizziness and falls.  1. Falls and dizziness:  Patient most likely has syncope secondary to bacteremia. Other possible diagnoses include diabetes autonomic neuropathy, vasovagal syncope, cardiac causes, hypovolemia, and nerve-blocking medication induced syncope. Patient's WBC count and positive blood cultures for Gram (+) cocci in pairs indicates that she is bacteremic, despite the lack of fever. Patient's hypovolemia can be due to venodilation that occurs with bacteremia. Although her orthostatic vitals were normal the day before, the morning presentation of dizziness when she gets up to use the bathroom can be caused by the venodilation.   Patient does not show overt signs of autonomic neuropathy because she has not had changes in urination or defecation pattern. She does experience nausea, but no vomiting to indicate gastroparesis, which is a symptom of autonomic neuropathy. Cardiac causes are unlikely due to patient's normal valve structures and a lack of obstructive etiologies on TTE. No arrhythmias are seen on ECG or tele. Troponin I levels have been within normal. There is no carotid stenosis from carotid ultrasound. Vasovagal etiologies are unlikely due to a lack of history of dizziness when turning head, but we would need to perform carotid massage to test for this etiology. Patient was scheduled to have PT evaluation of vagovasal syncope, but patient refused. Patient could be hypovolemic due to use of Lasix. Her weight dropped 2kg in one day. Her home medications such as Lyrica and Baclofen could be causes of her syncope, but they have been held.   -Continue vancomycin and ceftriaxone -TEE, a more sensitive test for valvular disease -Monitor for signs of sepsis -Started maintenance IVF for hypovolemia -Tele bed to  monitor for any arrhythmia overnight  2. Chest pain Patient's description of chest pain is more consistent with GERD than with MI or angina given the lack of diaphoresis, characteristic crushing chest pain, SOB, radiation to left arm, or SOB on exertion. Rather, the sudden onset after meals, and similar episodes in the last 5-6 years suggest that this may be an exacerbation of GERD. The muscle pain is more consistently with musculoskeletal pain. Currently, her chest pain is not concerning enough for a MI work-up, but we will monitor in the next few days to see if there is another exacerbation, and increase PPI dosage. -Increase dose of pantoprazole  3. R leg pain  Patient's description of the R leg pain is consistent with her symptoms prior to stent placement in bilateral common iliac. Although her physical exam does not show greater swelling in her R leg than her L leg and similar darkened toes and woody appearance on lower leg skin since admission, she has extreme pain in her R leg on contact and cannot adequately lift her leg. Her last ABI was done in 2010, and her current LE venous duplex did not include the common iliac in its evaluation. Thus, we are going to perform an ABI to evaluation of claudication. -ABI  4. R lower extremity swelling/readness MRI did not show signs of cellulitis or osteomyelitis. This redness and swelling could be due to superficial cellulitis caused by patient's bacterial infection. We will continue on her antibiotics. DVT is not a likely  cause right now due to a lack of DVT seen on LE venous duplex. However,  -continue antibiotics as listed above  5. R foot ulcer, and L foot pain MRI ruled out cellulitis or acute osteomyelitis in both feet. In L foot, there's an indication of Morton's neuroma, and nodular thickening of the medial cord of the plantar fascia. In R foot, there's stable erosion of the distal fourth phalanx, indicating chronic osteomyelitis, and stable  nodularity of the plantar fascia suspicious for plantar fibromatosis. Wound care recommends orthopedic evaluation for the right foot and possibly for the left foot due to difficulty of removing the necrotic tissue for better wound healing. -Consult ortho  6. COPD Patient has worsening chronic cough and new yellow/green sputum production. She has a 60 pack years of smoking history. She reported worsening cough, change in sputum quality, and SOB, which indicates COPD exacerbation. Thus, she was started on oral prednisone yesterday.  -Prednisone PO -levalbuterol, ipratropium nebs -Mometasone-formoterol inhaler  7. Pulmonary edema -Patient showed signs of pulmonary edema on CXR, but EF was 60-65% on recent ECHO. However she complained of SOB, her foot are swollen, and her pro-BNP was elevated to the 700s. She does not appear to have heart failure clinically. Thus, we will stop furosemide since she may be bacteremic that could lead to sepsis.  -monitor BNP level  8. Headache  Patient has had HA for several days, unclear if preceded or followed falls. No visual changes or pain on palpation of temporal artery, CT unremarkable. ESR is elevated, but patient also has a bacterial infection at the time of ESR check. -Monitor headache.   9. Hyperglycemia in setting of T2DM Patient has been non adherent, and has received diabetic education while in the hospital.  -Home insulin regiemtn with Novolog 125U qAM adn 65U qPM -SSI -Follow Bmet and AG  10. HTN -Continue home regimen of atenolo, HCTZ, and benazepril  11. Peripheral vascular disease -Continue clopedogrel  12. Depression/mood disorder -Continue escitalopram  13. Fibromyalgia -Continue pregabalin  14: Nicotine withdrawal -nicotine patch 14mg   Proph:  -DVT: heparin 5000U [place] -CAD in the setting of T2DM: aspirin, simvastatin -GI: pantoprazole  Dispo: patient is not ready to be discharged at this time due to active medical problems that  need inpatient observation and management.

## 2013-01-29 LAB — BASIC METABOLIC PANEL
BUN: 24 mg/dL — ABNORMAL HIGH (ref 6–23)
CO2: 25 mEq/L (ref 19–32)
Calcium: 8.9 mg/dL (ref 8.4–10.5)
Chloride: 102 mEq/L (ref 96–112)
Creatinine, Ser: 0.71 mg/dL (ref 0.50–1.10)
GFR calc Af Amer: >90 mL/min (ref >90)
GFR calc non Af Amer: >90 mL/min (ref >90)
Glucose, Bld: 134 mg/dL — ABNORMAL HIGH (ref 70–99)
Potassium: 3.3 mEq/L — ABNORMAL LOW (ref 3.5–5.1)
Sodium: 139 mEq/L (ref 135–145)

## 2013-01-29 LAB — GLUCOSE, CAPILLARY
Glucose-Capillary: 108 mg/dL — ABNORMAL HIGH (ref 70–99)
Glucose-Capillary: 128 mg/dL — ABNORMAL HIGH (ref 70–99)
Glucose-Capillary: 50 mg/dL — ABNORMAL LOW (ref 70–99)
Glucose-Capillary: 326 mg/dL — ABNORMAL HIGH (ref 70–99)
Glucose-Capillary: 255 mg/dL — ABNORMAL HIGH (ref 70–99)

## 2013-01-29 LAB — CULTURE, BLOOD (ROUTINE X 2)

## 2013-01-29 LAB — CBC
HCT: 37.8 % (ref 36.0–46.0)
Hemoglobin: 12.3 g/dL (ref 12.0–15.0)
MCH: 30.8 pg (ref 26.0–34.0)
MCHC: 32.5 g/dL (ref 30.0–36.0)
MCV: 94.5 fL (ref 78.0–100.0)
Platelets: 195 10*3/uL (ref 150–400)
RBC: 4 MIL/uL (ref 3.87–5.11)
RDW: 14.2 % (ref 11.5–15.5)
WBC: 10.4 10*3/uL (ref 4.0–10.5)

## 2013-01-29 LAB — MAGNESIUM: Magnesium: 2.1 mg/dL (ref 1.5–2.5)

## 2013-01-29 MED ORDER — POTASSIUM CHLORIDE CRYS ER 20 MEQ PO TBCR
40.0000 meq | EXTENDED_RELEASE_TABLET | Freq: Once | ORAL | Status: AC
  Administered 2013-01-29: 40 meq via ORAL

## 2013-01-29 MED ORDER — INSULIN ASPART PROT & ASPART (70-30 MIX) 100 UNIT/ML ~~LOC~~ SUSP
65.0000 [IU] | Freq: Every day | SUBCUTANEOUS | Status: DC
  Administered 2013-01-29 – 2013-02-01 (×4): 65 [IU] via SUBCUTANEOUS
  Filled 2013-01-29: qty 10

## 2013-01-29 MED ORDER — SILVER SULFADIAZINE 1 % EX CREA
TOPICAL_CREAM | Freq: Every day | CUTANEOUS | Status: DC
  Administered 2013-01-29 – 2013-02-01 (×4): via TOPICAL
  Filled 2013-01-29: qty 85

## 2013-01-29 MED ORDER — INSULIN ASPART PROT & ASPART (70-30 MIX) 100 UNIT/ML ~~LOC~~ SUSP
125.0000 [IU] | Freq: Every day | SUBCUTANEOUS | Status: DC
  Administered 2013-01-30 – 2013-02-01 (×2): 125 [IU] via SUBCUTANEOUS
  Filled 2013-01-29: qty 10

## 2013-01-29 NOTE — Progress Notes (Signed)
Pt's son brought in food for patient for supper.   This consisted of french fries, heavily salted, bacon-cheeseburger, and regular coke.  Attempt made to educate patient re:  Effects of increased blood sugar on wound healing and general health.  Pt. States, " I'll eat what I want, when I want".  Pt. Usually takes 125 units of insulin every am, but admits consumes "alot of sugar and carbs".  She does not appear to be concerned, "because the insulin will take care of my blood sugar - why should I worry?"

## 2013-01-29 NOTE — Consult Note (Addendum)
Reason for Consult: Bilateral diabetic insensate neuropathic plantar foot ulcers Referring Physician: Dr. Zenaida Niece dam  Jennifer [name] is an 52 y.o. female.  HPI: Patient is a 52 year old woman with diabetic insensate neuropathy who presents with a large ulcerations and cellulitis bilateral feet. Patient previously [facility] a [facility] but has not been there recently.  Past Medical History  Diagnosis Date  . Depression   . Diabetes mellitus   . GERD (gastroesophageal reflux disease)   . Hyperlipidemia   . Hypertension   . Chronic kidney disease   . Glaucoma   . COPD (chronic obstructive pulmonary disease)   . Chronic shoulder pain   . Chronic back pain   . PVD (peripheral vascular disease) with claudication     Stents to bilateral common iliac arteries, on chronic plavix  . Tinnitus   . Peripheral neuropathy   . Fibromyalgia   . Colon polyp   . Diverticula of colon   . Diverticulosis   . Asthma     Past Surgical History  Procedure Laterality Date  . Abdominal hysterectomy    . Tubal ligation    . Rotator cuff repair      bilateral  . Breast biopsy      multiple-benign per pt  . Wrist surgery      right  . Ganglion cyst excision      multiple  . Other surgical history      stents in lower ext    History reviewed. No pertinent family history.  Social History:  reports that she has been smoking Cigarettes.  She has a 60 pack-year smoking history. Her smokeless tobacco use includes Snuff. She reports that she does not drink alcohol or use illicit drugs.  Allergies:  Allergies  Allergen Reactions  . 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 Morphine Sulfate     REACTION: hives  . Vicodin (Hydrocodone-Acetaminophen) Itching    Medications: I have reviewed the patient's current medications.  Results for orders placed during the hospital encounter of 01/25/13 (from the past 48 hour(s))   GLUCOSE, CAPILLARY     Status: Abnormal   Collection Time    01/27/13  1:54 PM      Result Value Range   Glucose-Capillary 170 (*) 70 - 99 mg/dL  CULTURE, BLOOD (ROUTINE X 2)     Status: None   Collection Time    01/27/13  2:55 PM      Result Value Range   Specimen Description BLOOD LEFT ARM     Special Requests BOTTLES DRAWN AEROBIC ONLY 10CC     Culture  Setup Time 01/27/2013 22:01     Culture       Value:        BLOOD CULTURE RECEIVED NO GROWTH TO DATE CULTURE WILL BE HELD FOR 5 DAYS BEFORE ISSUING A FINAL NEGATIVE REPORT   Report Status PENDING    CULTURE, BLOOD (ROUTINE X 2)     Status: None   Collection Time    01/27/13  3:05 PM      Result Value Range   Specimen Description BLOOD LEFT HAND     Special Requests BOTTLES DRAWN AEROBIC ONLY 10CC     Culture  Setup Time 01/27/2013 22:01     Culture       Value:        BLOOD CULTURE RECEIVED NO GROWTH TO DATE CULTURE WILL BE  HELD FOR 5 DAYS BEFORE ISSUING A FINAL NEGATIVE REPORT   Report Status PENDING    GLUCOSE, CAPILLARY     Status: Abnormal   Collection Time    01/27/13  4:51 PM      Result Value Range   Glucose-Capillary 226 (*) 70 - 99 mg/dL  GLUCOSE, CAPILLARY     Status: Abnormal   Collection Time    01/27/13  8:42 PM      Result Value Range   Glucose-Capillary 232 (*) 70 - 99 mg/dL   Comment 1 Notify RN    GLUCOSE, CAPILLARY     Status: Abnormal   Collection Time    01/28/13  7:52 AM      Result Value Range   Glucose-Capillary 193 (*) 70 - 99 mg/dL   Comment 1 Documented in Chart     Comment 2 Notify RN    GLUCOSE, CAPILLARY     Status: Abnormal   Collection Time    01/28/13 11:34 AM      Result Value Range   Glucose-Capillary 215 (*) 70 - 99 mg/dL   Comment 1 Documented in Chart     Comment 2 Notify RN    GLUCOSE, CAPILLARY     Status: Abnormal   Collection Time    01/28/13  4:49 PM      Result Value Range   Glucose-Capillary 264 (*) 70 - 99 mg/dL   Comment 1 Documented in Chart     Comment 2  Notify RN    GLUCOSE, CAPILLARY     Status: Abnormal   Collection Time    01/28/13  8:36 PM      Result Value Range   Glucose-Capillary 313 (*) 70 - 99 mg/dL   Comment 1 Notify RN    BASIC METABOLIC PANEL     Status: Abnormal   Collection Time    01/29/13  6:00 AM      Result Value Range   Sodium 139  135 - 145 mEq/L   Potassium 3.3 (*) 3.5 - 5.1 mEq/L   Chloride 102  96 - 112 mEq/L   CO2 25  19 - 32 mEq/L   Glucose, Bld 134 (*) 70 - 99 mg/dL   BUN 24 (*) 6 - 23 mg/dL   Creatinine, Ser 1.61  0.50 - 1.10 mg/dL   Calcium 8.9  8.4 - 09.6 mg/dL   GFR calc non Af Amer >90  >90 mL/min   GFR calc Af Amer >90  >90 mL/min   Comment:            The eGFR has been calculated     using the CKD EPI equation.     This calculation has not been     validated in all clinical     situations.     eGFR's persistently     <90 mL/min signify     possible Chronic Kidney Disease.  GLUCOSE, CAPILLARY     Status: Abnormal   Collection Time    01/29/13  7:46 AM      Result Value Range   Glucose-Capillary 108 (*) 70 - 99 mg/dL   Comment 1 Notify RN    GLUCOSE, CAPILLARY     Status: Abnormal   Collection Time    01/29/13 11:40 AM      Result Value Range   Glucose-Capillary 50 (*) 70 - 99 mg/dL   Comment 1 Notify RN    GLUCOSE, CAPILLARY     Status: Abnormal  Collection Time    01/29/13 12:17 PM      Result Value Range   Glucose-Capillary 128 (*) 70 - 99 mg/dL   Comment 1 Notify RN      Mr Foot Right W Wo Contrast  01/27/2013  *RADIOLOGY REPORT*  Clinical Data: Right foot and ankle edema, erythema and medial plantar wound.  MRI OF THE RIGHT FOREFOOT WITHOUT AND WITH CONTRAST  Technique:  Multiplanar, multisequence MR imaging was performed both before and after administration of intravenous contrast.  Contrast: 20mL MULTIHANCE GADOBENATE DIMEGLUMINE 529 MG/ML IV SOLN  Comparison: Toe radiographs 07/09/2012.  MRI 07/30/2010.  Findings: The right forefoot exam quality is significantly better than  that of the opposite foot.  There is no significant motion artifact.  Examination includes most of the forefoot and midfoot.  There is diffuse some dorsal subcutaneous edema.  There is edema and enhancement throughout the flexor musculature. The muscles are diffusely atrophied on T1-weighted images.  No focal fluid collections are identified.  Again demonstrated is a well-circumscribed 3.5 cm lipoma plantar to the first and second metatarsals.  This appears unchanged.  There is no associated osseous erosion.  The distal fourth phalanx appears chronically eroded, not grossly changed from the radiographs of 7 months prior.  No acute bone destruction or suspicious marrow enhancement is identified to suggest acute osteomyelitis.  There is diffuse nodularity of the plantar fascia within the midfoot and forefoot which is similar to the prior examination and suspicious for plantar fibromatosis.  IMPRESSION:  1.  No evidence of right forefoot soft tissue abscess or acute osteomyelitis. 2.  Stable erosion of the distal fourth phalanx from radiographs of 07/09/2012.  This may indicate chronic osteomyelitis. 3.  Stable chronic myopathic changes and lipoma within the forefoot. 4.  Stable nodularity of the plantar fascia suspicious for plantar fibromatosis.   Original Report Authenticated By: Carey Bullocks, M.D.    Mr Foot Left W Wo Contrast  01/27/2013  *RADIOLOGY REPORT*  Clinical Data: Left foot edema and erythema with medial plantar wound.  MRI OF THE LEFT FOREFOOT WITHOUT AND WITH CONTRAST  Technique:  Multiplanar, multisequence MR imaging was performed both before and after administration of intravenous contrast.  Contrast:  20 ml Multihance.  Comparison: Radiographs 08/11/2010.  Findings: Study is moderately motion degraded.  A variably sized field of view includes most of the forefoot and midfoot.  The distal toes and hind foot are incompletely visualized.  There is mild dorsal subcutaneous edema.  There is edema and  enhancement throughout the flexor musculature of the forefoot.  No focal fluid collection is identified. There appears to be focal soft tissue enhancement within the plantar aspect of the second webspace.  This area is not well visualized on T2-weighted images.  There is irregular nodular thickening of the medial cord of the plantar fascia within the forefoot.  No discontinuity is identified.  On the sagittal images, the medial cord is mildly thickened adjacent to a plantar calcaneal spur.  There is no evidence of bone destruction or suspicious enhancement to suggest osteomyelitis.  Mild degenerative changes are present at the Lisfranc joint.  IMPRESSION:  1.  No evidence of soft tissue abscess or osteomyelitis. 2.  Nonspecific forefoot muscular edema and enhancement could represent myositis or diabetic myopathy. 3.  Focal enhancement within the plantar aspect of the second webspace.  This could indicate a Morton's neuroma. 4.  Nodular thickening of the medial cord of the plantar fascia is suspicious for plantar fibromatosis.  Original Report Authenticated By: Carey Bullocks, M.D.     Review of Systems  All other systems reviewed and are negative.   Blood pressure 131/87, pulse 55, temperature 97.5 F (36.4 C), temperature source Oral, resp. rate 18, height 5\' 7"  (1.702 m), weight 117.482 kg (259 lb), SpO2 96.00%. Physical Exam On examination patient has venous stasis insufficiency bilateral lower extremities with brawny skin color changes and pitting edema. Patient states she does have compression stockings but does not wear them. Examination she does have a good dorsalis pedis and posterior tibial pulse. Review of the ankle brachial indices shows good triphasic wave form and adequate circulation. Review of both feet shows her to have a black eschar ulcer which approximately 3 x 5 cm in the plantar aspect the left foot. This is superficial there is no deep abscess. She does have heel cord contracture  with dorsiflexion about 10 short of neutral on the left foot 20 short of neutral on the right foot. Examination of right foot she has a large ulcer. The skin and soft tissue was debrided and removed. There is epithelialization beneath the ulcer. There is no abscess no cellulitis no signs of any deep infection. The right foot ulcer is a 5 x 7 cm. Assessment/Plan: Assessment: Bilateral Wagner grade 1 diabetic insensate neuropathic plantar foot ulcers. Heel cord contractures bilaterally.  Venous stasis insufficiency bilateral lower extremities with no venous stasis ulcers.  Plan: The skin and soft tissue was debrided from the right foot ulcer this shows no signs of any deep abscess. Patient was given instructions for heel cord stretching. I will have her start with Silvadene dressing changes and dial soap cleansing daily. Will order postoperative shoes and the Silvadene. Patient may followup with either myself or the wound center. I do not feel that she needs any antibiotics for discharge to home.  Patient underwent excisional debridement of the right foot ulcer.  Cayden Granholm V 01/29/2013, 12:29 PM

## 2013-01-29 NOTE — Progress Notes (Signed)
Orthopedic Tech Progress Note Patient Details:  Jennifer [name] Oct 28, 1961 [id number]  Ortho Devices Type of Ortho Device: Postop shoe/boot Ortho Device/Splint Location: bilateral post op shoes Ortho Device/Splint Interventions: Application   [name] 01/29/2013, 4:47 PM

## 2013-01-29 NOTE — Progress Notes (Signed)
Medical Student Daily Progress Note  Subjective: Afebrile overnight. Patient describes having dark, tarry stools. She says her chest pain has improved, but that she is still coughing. She also reports that her left foot is still very painful.  Objective: Vital signs in last 24 hours: Filed Vitals:   01/28/13 2032 01/28/13 2052 01/29/13 0500 01/29/13 0853  BP: 170/93  131/87   Pulse: 58  55   Temp: 97.6 F (36.4 C)  97.5 F (36.4 C)   TempSrc: Oral  Oral   Resp: 18  18   Height:      Weight:   117.482 kg (259 lb)   SpO2: 96% 97% 93% 96%   Weight change: 0.272 kg (9.6 oz)  Intake/Output Summary (Last 24 hours) at 01/29/13 1220 Last data filed at 01/29/13 0900  Gross per 24 hour  Intake    360 ml  Output      0 ml  Net    360 ml   Physical Exam: GEN: Alert, not in acute distress, obese female, lying in bed with legs elevated  HEENT: PERRLA, EOMI, supple neck CV: RRR, no murmurs, no gallops, no rubs  PULM:  not in acute respiratory distress, diminished lung sounds, symmetrical bilateral lung expansion  ABD: some mild diffuse tenderness, no distended  NEURO: A&Ox3, CNII-XII intact, numbness in LE bilaterally  Extremities: 1+ pedal edema bilaterally, <2s capillary refill on fingernail beds, ulcer on plantar surface of both feet. Left foot ulcer has hard eschar. No drainage from either ulcer.  Lab Results: @labtest2 @ Micro Results: Recent Results (from the past 240 hour(s))  CULTURE, BLOOD (ROUTINE X 2)     Status: None   Collection Time    01/26/13 12:30 AM      Result Value Range Status   Specimen Description BLOOD ARM LEFT   Final   Special Requests BOTTLES DRAWN AEROBIC AND ANAEROBIC 10CC   Final   Culture  Setup Time 01/26/2013 03:50   Final   Culture     Final   Value: STAPHYLOCOCCUS AUREUS     Note: RIFAMPIN AND GENTAMICIN SHOULD NOT BE USED AS SINGLE DRUGS FOR TREATMENT OF STAPH INFECTIONS.     GROUP B STREP(S.AGALACTIAE)ISOLATED     Note: Gram Stain Report  Called to,Read Back By and Verified With: CHRISTY JOHNSON ON 01/26/2013 AT 6:50P BY WILEJ   Report Status 01/29/2013 FINAL   Final   Organism ID, Bacteria STAPHYLOCOCCUS AUREUS   Final   Organism ID, Bacteria GROUP B STREP(S.AGALACTIAE)ISOLATED   Final  CULTURE, BLOOD (ROUTINE X 2)     Status: None   Collection Time    01/26/13 12:45 AM      Result Value Range Status   Specimen Description BLOOD HAND RIGHT   Final   Special Requests BOTTLES DRAWN AEROBIC AND ANAEROBIC 10CC   Final   Culture  Setup Time 01/26/2013 03:50   Final   Culture     Final   Value: STAPHYLOCOCCUS AUREUS     GROUP B STREP(S.AGALACTIAE)ISOLATED     Note: Gram Stain Report Called to,Read Back By and Verified With: CHRISTY JOHNSON ON 01/26/2013 AT 6:50P BY WILEJ   Report Status 01/29/2013 FINAL   Final  CULTURE, BLOOD (ROUTINE X 2)     Status: None   Collection Time    01/27/13  2:55 PM      Result Value Range Status   Specimen Description BLOOD LEFT ARM   Final   Special Requests BOTTLES DRAWN AEROBIC ONLY  10CC   Final   Culture  Setup Time 01/27/2013 22:01   Final   Culture     Final   Value:        BLOOD CULTURE RECEIVED NO GROWTH TO DATE CULTURE WILL BE HELD FOR 5 DAYS BEFORE ISSUING A FINAL NEGATIVE REPORT   Report Status PENDING   Incomplete  CULTURE, BLOOD (ROUTINE X 2)     Status: None   Collection Time    01/27/13  3:05 PM      Result Value Range Status   Specimen Description BLOOD LEFT HAND   Final   Special Requests BOTTLES DRAWN AEROBIC ONLY 10CC   Final   Culture  Setup Time 01/27/2013 22:01   Final   Culture     Final   Value:        BLOOD CULTURE RECEIVED NO GROWTH TO DATE CULTURE WILL BE HELD FOR 5 DAYS BEFORE ISSUING A FINAL NEGATIVE REPORT   Report Status PENDING   Incomplete   Studies/Results: Mr Foot Right W Wo Contrast  01/27/2013  *RADIOLOGY REPORT*  Clinical Data: Right foot and ankle edema, erythema and medial plantar wound.  MRI OF THE RIGHT FOREFOOT WITHOUT AND WITH CONTRAST  Technique:   Multiplanar, multisequence MR imaging was performed both before and after administration of intravenous contrast.  Contrast: 20mL MULTIHANCE GADOBENATE DIMEGLUMINE 529 MG/ML IV SOLN  Comparison: Toe radiographs 07/09/2012.  MRI 07/30/2010.  Findings: The right forefoot exam quality is significantly better than that of the opposite foot.  There is no significant motion artifact.  Examination includes most of the forefoot and midfoot.  There is diffuse some dorsal subcutaneous edema.  There is edema and enhancement throughout the flexor musculature. The muscles are diffusely atrophied on T1-weighted images.  No focal fluid collections are identified.  Again demonstrated is a well-circumscribed 3.5 cm lipoma plantar to the first and second metatarsals.  This appears unchanged.  There is no associated osseous erosion.  The distal fourth phalanx appears chronically eroded, not grossly changed from the radiographs of 7 months prior.  No acute bone destruction or suspicious marrow enhancement is identified to suggest acute osteomyelitis.  There is diffuse nodularity of the plantar fascia within the midfoot and forefoot which is similar to the prior examination and suspicious for plantar fibromatosis.  IMPRESSION:  1.  No evidence of right forefoot soft tissue abscess or acute osteomyelitis. 2.  Stable erosion of the distal fourth phalanx from radiographs of 07/09/2012.  This may indicate chronic osteomyelitis. 3.  Stable chronic myopathic changes and lipoma within the forefoot. 4.  Stable nodularity of the plantar fascia suspicious for plantar fibromatosis.   Original Report Authenticated By: Carey Bullocks, M.D.    Mr Foot Left W Wo Contrast  01/27/2013  *RADIOLOGY REPORT*  Clinical Data: Left foot edema and erythema with medial plantar wound.  MRI OF THE LEFT FOREFOOT WITHOUT AND WITH CONTRAST  Technique:  Multiplanar, multisequence MR imaging was performed both before and after administration of intravenous contrast.   Contrast:  20 ml Multihance.  Comparison: Radiographs 08/11/2010.  Findings: Study is moderately motion degraded.  A variably sized field of view includes most of the forefoot and midfoot.  The distal toes and hind foot are incompletely visualized.  There is mild dorsal subcutaneous edema.  There is edema and enhancement throughout the flexor musculature of the forefoot.  No focal fluid collection is identified. There appears to be focal soft tissue enhancement within the plantar aspect of the second  webspace.  This area is not well visualized on T2-weighted images.  There is irregular nodular thickening of the medial cord of the plantar fascia within the forefoot.  No discontinuity is identified.  On the sagittal images, the medial cord is mildly thickened adjacent to a plantar calcaneal spur.  There is no evidence of bone destruction or suspicious enhancement to suggest osteomyelitis.  Mild degenerative changes are present at the Lisfranc joint.  IMPRESSION:  1.  No evidence of soft tissue abscess or osteomyelitis. 2.  Nonspecific forefoot muscular edema and enhancement could represent myositis or diabetic myopathy. 3.  Focal enhancement within the plantar aspect of the second webspace.  This could indicate a Morton's neuroma. 4.  Nodular thickening of the medial cord of the plantar fascia is suspicious for plantar fibromatosis.   Original Report Authenticated By: Carey Bullocks, M.D.    Medications: I have reviewed the patient's current medications. Scheduled Meds: . aspirin  81 mg Oral Daily  . atenolol  100 mg Oral Daily  . benazepril  40 mg Oral Daily  .  ceFAZolin (ANCEF) IV  2 g Intravenous Q8H  . clopidogrel  75 mg Oral Daily  . escitalopram  20 mg Oral Daily  . heparin  5,000 Units Subcutaneous Q8H  . hydrochlorothiazide  25 mg Oral Daily  . insulin aspart  0-15 Units Subcutaneous TID WC  . insulin aspart  0-5 Units Subcutaneous QHS  . [START ON 01/30/2013] insulin aspart protamine-insulin  aspart  125 Units Subcutaneous Q breakfast  . insulin aspart protamine-insulin aspart  65 Units Subcutaneous Q supper  . ipratropium  0.5 mg Nebulization BID  . levalbuterol  0.63 mg Nebulization BID  . mometasone-formoterol  2 puff Inhalation BID  . nicotine  14 mg Transdermal Q24H  . pantoprazole  80 mg Oral Daily  . potassium chloride  40 mEq Oral Once  . predniSONE  60 mg Oral Q breakfast  . pregabalin  100 mg Oral TID  . simvastatin  20 mg Oral q1800  . sodium chloride  3 mL Intravenous Q12H  . vancomycin  1,000 mg Intravenous Q12H   Continuous Infusions:  PRN Meds:.sodium chloride, acetaminophen, acetaminophen, albuterol, benzonatate, ondansetron (ZOFRAN) IV, ondansetron, oxyCODONE, sodium chloride Assessment/Plan: Mrs. Bywater is a 52yo African American woman with h/o T2DM complicated by foot ulcers and peripheral neuropathy, PVD s/p stenting in bilateral common iliac, HTN, COPD, CKD and fibromylagia who presents with dizziness and falls.   1. Falls and dizziness:  Patient most likely has syncope secondary to bacteremia. Other possible diagnoses include diabetes autonomic neuropathy, vasovagal syncope, cardiac causes, hypovolemia, and nerve-blocking medication induced syncope. Patient's WBC count and positive blood cultures for Group B Strep indicates that she is bacteremic, despite the lack of fever. Patient's hypovolemia can be due to venodilation that occurs with bacteremia. Although her orthostatic vitals were normal the day before, the morning presentation of dizziness when she gets up to use the bathroom can be caused by the venodilation.    Cardiac causes are unlikely due to patient's normal valve structures and a lack of obstructive etiologies on TTE. No arrhythmias are seen on ECG or tele. There is no carotid stenosis from carotid ultrasound. Vasovagal etiologies are unlikely due to a lack of history of dizziness when turning head. Patient was scheduled to have PT evaluation of  vagovasal syncope, but patient refused. Patient could be hypovolemic due to use of Lasix. Her weight dropped 2kg in one day. Drugs that can cause syncope, such as  Lyrica and Baclofen, have been held.   New dark, tarry stools are concerning for possible upper GI bleed.  -Continue vancomycin and ceftriaxone  -Waiting on TEE -Monitor for signs of sepsis and follow blood culture results. -will order CBC and FOBT  2. Chest pain  Patient denies chest pain today after increasing PPI dose. Previously, patient's description of chest pain was more consistent with GERD than with MI or angina given the lack of diaphoresis, characteristic crushing chest pain, SOB, radiation to left arm, or SOB on exertion. We will continue to monitor. -continue current dose of pantoprazole  3. R leg pain  Patient's description of the R leg pain is consistent with her symptoms prior to stent placement in bilateral common iliac. Although her physical exam does not show greater swelling in her R leg than her L leg and similar darkened toes and woody appearance on lower leg skin since admission, she has extreme pain in her R leg on contact and cannot adequately lift her leg. ABI yesterday was within normal limits. Patient says pain today is somewhat improved. -continue to monitor  4. R lower extremity swelling/readness  MRI did not show signs of cellulitis or osteomyelitis. This redness and swelling could be due to superficial cellulitis caused by patient's bacterial infection. We will continue on her antibiotics. DVT is not a likely cause right now due to a lack of DVT seen on LE venous duplex. -continue antibiotics as listed above   5. R foot ulcer, and L foot pain  MRI ruled out cellulitis or acute osteomyelitis in both feet. In L foot, there's an indication of Morton's neuroma, and nodular thickening of the medial cord of the plantar fascia. In R foot, there's stable erosion of the distal fourth phalanx, indicating chronic  osteomyelitis, and stable nodularity of the plantar fascia suspicious for plantar fibromatosis. Wound care recommends orthopedic evaluation for the right foot and possibly for the left foot due to difficulty of removing the necrotic tissue for better wound healing.  -Ortho has started her on Silvadene dressing changes and dial soap cleansing daily with postoperatives shoes. Recommend follow-up with Dr. Lajoyce Corners or wound center.  6. COPD  Patient has worsening chronic cough and new yellow/green sputum production. She has a 60 pack years of smoking history. She reported worsening cough, change in sputum quality, and SOB, which indicates COPD exacerbation.  -continue Prednisone PO  - continue levalbuterol, ipratropium nebs  -continue Mometasone-formoterol inhaler   7. Pulmonary edema  Patient showed signs of pulmonary edema on CXR, but EF was 60-65% on recent ECHO. However she complained of SOB, her feet are swollen, and her pro-BNP was elevated to the 700s. She does not appear to have heart failure clinically. Patient's SOB has improved -continue to monitor  8. Headache  Patient has had HA for several days, unclear if preceded or followed falls. No visual changes or pain on palpation of temporal artery, CT unremarkable. ESR is elevated, but patient also has a bacterial infection at the time of ESR check. Patient does not complain of headache this AM.  -Monitor headache.   9. Hyperglycemia in setting of T2DM  Patient has been non adherent, and has received diabetic education while in the hospital.  -Home insulin regiemtn with Novolog 125U qAM adn 65U qPM  -SSI  -Follow Bmet and AG   10. HTN  -Continue home regimen of atenolo, HCTZ, and benazepril   11. Peripheral vascular disease  -Continue clopedogrel.  12. Depression/mood disorder  -Continue escitalopram  13. Fibromyalgia  -Continue pregabalin   14: Nicotine withdrawal  -nicotine patch 14mg    Proph:  -DVT: heparin 5000U Epps  -CAD  in the setting of T2DM: aspirin, simvastatin  -GI: pantoprazole   LOS: 4 days   This is a Psychologist, occupational Note.  The care of the patient was discussed with Dr. Dierdre Searles and the assessment and plan formulated with their assistance.  Please see their attached note for official documentation of the daily encounter.  Roe Coombs 01/29/2013, 12:20 PM  Resident Co-sign Daily Note: I have seen the patient and reviewed the daily progress note by MS 4 Jocob Wang and discussed the care of the patient with them.  See below for documentation of my findings, assessment, and plans.  Subjective: Patient doing well. Still report feet pain as before. Report black stool ( on Plavix and heparin). CBC and FOBT pending.  Objective: Vital signs in last 24 hours: Filed Vitals:   01/28/13 2052 01/29/13 0500 01/29/13 0853 01/29/13 1445  BP:  131/87  148/80  Pulse:  55  58  Temp:  97.5 F (36.4 C)  98.2 F (36.8 C)  TempSrc:  Oral  Oral  Resp:  18  20  Height:      Weight:  259 lb (117.482 kg)    SpO2: 97% 93% 96% 99%   Physical Exam: Patient declined for me to exam her today. She states, "too many doctors see me today." Lab Results: Reviewed and documented in Electronic Record Micro Results: Reviewed and documented in Electronic Record Studies/Results: Reviewed and documented in Electronic Record Medications: I have reviewed the patient's current medications. Scheduled Meds: . aspirin  81 mg Oral Daily  . atenolol  100 mg Oral Daily  . benazepril  40 mg Oral Daily  .  ceFAZolin (ANCEF) IV  2 g Intravenous Q8H  . clopidogrel  75 mg Oral Daily  . escitalopram  20 mg Oral Daily  . heparin  5,000 Units Subcutaneous Q8H  . hydrochlorothiazide  25 mg Oral Daily  . insulin aspart  0-15 Units Subcutaneous TID WC  . insulin aspart  0-5 Units Subcutaneous QHS  . [START ON 01/30/2013] insulin aspart protamine-insulin aspart  125 Units Subcutaneous Q breakfast  . insulin aspart protamine-insulin aspart  65  Units Subcutaneous Q supper  . ipratropium  0.5 mg Nebulization BID  . levalbuterol  0.63 mg Nebulization BID  . mometasone-formoterol  2 puff Inhalation BID  . nicotine  14 mg Transdermal Q24H  . pantoprazole  80 mg Oral Daily  . predniSONE  60 mg Oral Q breakfast  . pregabalin  100 mg Oral TID  . silver sulfADIAZINE   Topical Daily  . simvastatin  20 mg Oral q1800  . sodium chloride  3 mL Intravenous Q12H  . vancomycin  1,000 mg Intravenous Q12H   Continuous Infusions:  PRN Meds:.sodium chloride, acetaminophen, acetaminophen, albuterol, benzonatate, ondansetron (ZOFRAN) IV, ondansetron, oxyCODONE, sodium chloride Assessment/Plan: 1. Bacteremia,   01/26/13 blood cultures 2/2 STAPHYLOCOCCUS AUREUS and GROUP B STREP(S.AGALACTIAE) 2/6 blood culture x 2 pending 2/8 blood culture x 2 pending  - continue Vancomycin and ceftriaxone - TEE on Monday   2. Dark stools - CBC and FOBT  3. B/L feet ulcers and LE swelling - MRI Negative for OM - LE Doppler negative for DVT - ABI ok - Orthopedic surgeon consulted--recs wound care center and dressing changes  4. DM2 - consistent high CBG of 200-300 on home dose of Novolog 70/30 125 units in am  and 65 units in PM - low CBG of 50 this am--> 128 after treatment - she states that she did not eat dinner last night and eat a little breakfast before her am insulin. Her CBG was 108 AC - will instructed nurse to call MD for fasting CBG < 120 before insulin administration.   Other problems are stable. Please refer to MS 4 Roe Coombs note.        LOS: 4 days   Kriti Katayama 01/29/2013, 3:42 PM

## 2013-01-30 LAB — BASIC METABOLIC PANEL
BUN: 19 mg/dL (ref 6–23)
CO2: 25 mEq/L (ref 19–32)
Calcium: 9 mg/dL (ref 8.4–10.5)
Chloride: 103 mEq/L (ref 96–112)
Creatinine, Ser: 0.74 mg/dL (ref 0.50–1.10)
GFR calc Af Amer: >90 mL/min (ref >90)
GFR calc non Af Amer: >90 mL/min (ref >90)
Glucose, Bld: 158 mg/dL — ABNORMAL HIGH (ref 70–99)
Potassium: 3.9 mEq/L (ref 3.5–5.1)
Sodium: 139 mEq/L (ref 135–145)

## 2013-01-30 LAB — GLUCOSE, CAPILLARY
Glucose-Capillary: 151 mg/dL — ABNORMAL HIGH (ref 70–99)
Glucose-Capillary: 165 mg/dL — ABNORMAL HIGH (ref 70–99)
Glucose-Capillary: 242 mg/dL — ABNORMAL HIGH (ref 70–99)
Glucose-Capillary: 153 mg/dL — ABNORMAL HIGH (ref 70–99)

## 2013-01-30 LAB — CBC
HCT: 36.2 % (ref 36.0–46.0)
Hemoglobin: 12.2 g/dL (ref 12.0–15.0)
MCH: 31.7 pg (ref 26.0–34.0)
MCHC: 33.7 g/dL (ref 30.0–36.0)
MCV: 94 fL (ref 78.0–100.0)
Platelets: 194 10*3/uL (ref 150–400)
RBC: 3.85 MIL/uL — ABNORMAL LOW (ref 3.87–5.11)
RDW: 14.3 % (ref 11.5–15.5)
WBC: 14.4 10*3/uL — ABNORMAL HIGH (ref 4.0–10.5)

## 2013-01-30 LAB — OCCULT BLOOD X 1 CARD TO LAB, STOOL: Fecal Occult Bld: POSITIVE — AB

## 2013-01-30 NOTE — Progress Notes (Signed)
Subjective: Patient still reports intermittent B/L feet wound pain. Controlled with pain meds. No fever overnight. No acute events overnight.   Objective: Vital signs in last 24 hours: Filed Vitals:   01/29/13 0853 01/29/13 1445 01/29/13 2100 01/30/13 0630  BP:  148/80 138/58 166/76  Pulse:  58 68 55  Temp:  98.2 F (36.8 C) 97.9 F (36.6 C) 97.9 F (36.6 C)  TempSrc:  Oral Oral Oral  Resp:  20 18 18   Height:      Weight:    259 lb 0.7 oz (117.5 kg)  SpO2: 96% 99% 95% 98%   Weight change: 0.7 oz (0.018 kg)  Intake/Output Summary (Last 24 hours) at 01/30/13 1255 Last data filed at 01/29/13 1600  Gross per 24 hour  Intake   1330 ml  Output      0 ml  Net   1330 ml   GEN: Alert, not in acute distress, obese female, lying in bed with legs elevated  HEENT: PERRLA, EOMI, supple neck  CV: RRR, no murmurs, no gallops, no rubs  PULM: not in acute respiratory distress, diminished lung sounds, symmetrical bilateral lung expansion  ABD: some mild diffuse tenderness, no distended  NEURO: A&Ox3, CNII-XII intact, numbness in LE bilaterally  Extremities: 1+ pedal edema bilaterally, <2s capillary refill on fingernail beds, ulcer on plantar surface of both feet. Left foot ulcer has hard eschar. No drainage from either ulcer.   Lab Results: Basic Metabolic Panel:  Recent Labs Lab 01/29/13 0600 01/29/13 1230 01/30/13 0615  Airrion Otting 139  --  139  K 3.3*  --  3.9  CL 102  --  103  CO2 25  --  25  GLUCOSE 134*  --  158*  BUN 24*  --  19  CREATININE 0.71  --  0.74  CALCIUM 8.9  --  9.0  MG  --  2.1  --    Liver Function Tests:  Recent Labs Lab 01/26/13 0030  AST 32  ALT 32  ALKPHOS 94  BILITOT 0.8  PROT 6.9  ALBUMIN 3.1*   CBC:  Recent Labs Lab 01/25/13 2142  01/29/13 1747 01/30/13 0615  WBC 12.2*  < > 10.4 14.4*  NEUTROABS 9.7*  --   --   --   HGB 13.6  < > 12.3 12.2  HCT 40.4  < > 37.8 36.2  MCV 94.8  < > 94.5 94.0  PLT 113*  < > 195 194  < > = values in this  interval not displayed. Cardiac Enzymes:  Recent Labs Lab 01/26/13 0451  TROPONINI <0.30   BNP:  Recent Labs Lab 01/26/13 0028  PROBNP 727.3*   CBG:  Recent Labs Lab 01/29/13 1140 01/29/13 1217 01/29/13 1702 01/29/13 2107 01/30/13 0734 01/30/13 1123  GLUCAP 50* 128* 326* 255* 151* 165*   Hemoglobin A1C:  Recent Labs Lab 01/26/13 0030  HGBA1C 9.5*   Thyroid Function Tests:  Recent Labs Lab 01/26/13 0156  TSH 0.815   Coagulation: No results found for this basename: LABPROT, INR,  in the last 168 hours Anemia Panel:  Recent Labs Lab 01/26/13 0156  VITAMINB12 614   Urine Drug Screen: Drugs of Abuse     Component Value Date/Time   LABOPIA NONE DETECTED 01/26/2013 0036   LABOPIA SEE NOTE 07/21/2012 1631   COCAINSCRNUR NONE DETECTED 01/26/2013 0036   COCAINSCRNUR NEG 07/21/2012 1631   LABBENZ NONE DETECTED 01/26/2013 0036   LABBENZ NEG 07/21/2012 1631   LABBENZ NEG 06/28/2010 2130  AMPHETMU NONE DETECTED 01/26/2013 0036   AMPHETMU NEG 06/28/2010 2130   THCU NONE DETECTED 01/26/2013 0036   LABBARB NONE DETECTED 01/26/2013 0036   LABBARB NEG 07/21/2012 1631    Urinalysis:  Recent Labs Lab 01/26/13 0036  COLORURINE YELLOW  LABSPEC 1.033*  PHURINE 6.0  GLUCOSEU >1000*  HGBUR NEGATIVE  BILIRUBINUR NEGATIVE  KETONESUR 15*  PROTEINUR NEGATIVE  UROBILINOGEN 1.0  NITRITE NEGATIVE  LEUKOCYTESUR NEGATIVE    Micro Results: Recent Results (from the past 240 hour(s))  CULTURE, BLOOD (ROUTINE X 2)     Status: None   Collection Time    01/26/13 12:30 AM      Result Value Range Status   Specimen Description BLOOD ARM LEFT   Final   Special Requests BOTTLES DRAWN AEROBIC AND ANAEROBIC 10CC   Final   Culture  Setup Time 01/26/2013 03:50   Final   Culture     Final   Value: STAPHYLOCOCCUS AUREUS     Note: RIFAMPIN AND GENTAMICIN SHOULD NOT BE USED AS SINGLE DRUGS FOR TREATMENT OF STAPH INFECTIONS.     GROUP B STREP(S.AGALACTIAE)ISOLATED     Note: Gram Stain Report  Called to,Read Back By and Verified With: CHRISTY JOHNSON ON 01/26/2013 AT 6:50P BY WILEJ   Report Status 01/29/2013 FINAL   Final   Organism ID, Bacteria STAPHYLOCOCCUS AUREUS   Final   Organism ID, Bacteria GROUP B STREP(S.AGALACTIAE)ISOLATED   Final  CULTURE, BLOOD (ROUTINE X 2)     Status: None   Collection Time    01/26/13 12:45 AM      Result Value Range Status   Specimen Description BLOOD HAND RIGHT   Final   Special Requests BOTTLES DRAWN AEROBIC AND ANAEROBIC 10CC   Final   Culture  Setup Time 01/26/2013 03:50   Final   Culture     Final   Value: STAPHYLOCOCCUS AUREUS     GROUP B STREP(S.AGALACTIAE)ISOLATED     Note: Gram Stain Report Called to,Read Back By and Verified With: CHRISTY JOHNSON ON 01/26/2013 AT 6:50P BY WILEJ   Report Status 01/29/2013 FINAL   Final  CULTURE, BLOOD (ROUTINE X 2)     Status: None   Collection Time    01/27/13  2:55 PM      Result Value Range Status   Specimen Description BLOOD LEFT ARM   Final   Special Requests BOTTLES DRAWN AEROBIC ONLY 10CC   Final   Culture  Setup Time 01/27/2013 22:01   Final   Culture     Final   Value:        BLOOD CULTURE RECEIVED NO GROWTH TO DATE CULTURE WILL BE HELD FOR 5 DAYS BEFORE ISSUING A FINAL NEGATIVE REPORT   Report Status PENDING   Incomplete  CULTURE, BLOOD (ROUTINE X 2)     Status: None   Collection Time    01/27/13  3:05 PM      Result Value Range Status   Specimen Description BLOOD LEFT HAND   Final   Special Requests BOTTLES DRAWN AEROBIC ONLY 10CC   Final   Culture  Setup Time 01/27/2013 22:01   Final   Culture     Final   Value:        BLOOD CULTURE RECEIVED NO GROWTH TO DATE CULTURE WILL BE HELD FOR 5 DAYS BEFORE ISSUING A FINAL NEGATIVE REPORT   Report Status PENDING   Incomplete  CULTURE, BLOOD (ROUTINE X 2)     Status: None   Collection Time  01/29/13  2:10 PM      Result Value Range Status   Specimen Description BLOOD LEFT ARM   Final   Special Requests BOTTLES DRAWN AEROBIC ONLY 4CC   Final    Culture  Setup Time 01/29/2013 20:27   Final   Culture     Final   Value:        BLOOD CULTURE RECEIVED NO GROWTH TO DATE CULTURE WILL BE HELD FOR 5 DAYS BEFORE ISSUING A FINAL NEGATIVE REPORT   Report Status PENDING   Incomplete  CULTURE, BLOOD (ROUTINE X 2)     Status: None   Collection Time    01/29/13  2:20 PM      Result Value Range Status   Specimen Description BLOOD LEFT HAND   Final   Special Requests BOTTLES DRAWN AEROBIC ONLY [facility]   Final   Culture  Setup Time 01/29/2013 21:22   Final   Culture     Final   Value:        BLOOD CULTURE RECEIVED NO GROWTH TO DATE CULTURE WILL BE HELD FOR 5 DAYS BEFORE ISSUING A FINAL NEGATIVE REPORT   Report Status PENDING   Incomplete   Studies/Results: No results found. Medications: I have reviewed the patient's current medications. Scheduled Meds: . aspirin  81 mg Oral Daily  . atenolol  100 mg Oral Daily  . benazepril  40 mg Oral Daily  .  ceFAZolin (ANCEF) IV  2 g Intravenous Q8H  . clopidogrel  75 mg Oral Daily  . escitalopram  20 mg Oral Daily  . heparin  5,000 Units Subcutaneous Q8H  . hydrochlorothiazide  25 mg Oral Daily  . insulin aspart  0-15 Units Subcutaneous TID WC  . insulin aspart  0-5 Units Subcutaneous QHS  . insulin aspart protamine-insulin aspart  125 Units Subcutaneous Q breakfast  . insulin aspart protamine-insulin aspart  65 Units Subcutaneous Q supper  . ipratropium  0.5 mg Nebulization BID  . levalbuterol  0.63 mg Nebulization BID  . mometasone-formoterol  2 puff Inhalation BID  . nicotine  14 mg Transdermal Q24H  . pantoprazole  80 mg Oral Daily  . predniSONE  60 mg Oral Q breakfast  . pregabalin  100 mg Oral TID  . silver sulfADIAZINE   Topical Daily  . simvastatin  20 mg Oral q1800  . sodium chloride  3 mL Intravenous Q12H  . vancomycin  1,000 mg Intravenous Q12H   Continuous Infusions:  PRN Meds:.sodium chloride, acetaminophen, acetaminophen, albuterol, benzonatate, ondansetron (ZOFRAN) IV,  ondansetron, oxyCODONE, sodium chloride Assessment/Plan:  1. Bacteremia,  01/26/13 blood cultures 2/2 STAPHYLOCOCCUS AUREUS and GROUP B STREP(S.AGALACTIAE) 2/6 blood culture x 2 pending  2/8 blood culture x 2 pending  - continue Vancomycin and ceftriaxone  - TEE on Monday  - NPO after MN. Am insulin on hold.   2. Dark stools  - CBC stable - FOBT positive - patient is on Heaprin SQ now. No obvious bleeding or anemia. Will monitor.     3. B/L feet ulcers and LE swelling  - MRI Negative for OM  - LE Doppler negative for DVT  - ABI ok  - Orthopedic surgeon consulted--recs wound care center and dressing changes   4. DM2  - consistent high CBG of 200-300 on home dose of Novolog 70/30 125 units in am and 65 units in PM  - low CBG of 50 this am--> 128 after treatment  - she states that she did not eat dinner last  night and eat a little breakfast before her am insulin. Her CBG was 108 AC  - will instructed nurse to call MD for fasting CBG < 120 before insulin administration.  .  Other chronic problems are stable.     Dispo: Disposition is deferred at this time, awaiting improvement of current medical problems.  Anticipated discharge in approximately 3-4 day(s).   The patient does have a current PCP Blanch Media, MD), therefore is requiring OPC follow-up after discharge.   The patient does not have transportation limitations that hinder transportation to clinic appointments.  .Services Needed at time of discharge: Y = Yes, Blank = No PT:   OT:   RN:   Equipment:   Other:     LOS: 5 days   Abhi Moccia 01/30/2013, 12:55 PM

## 2013-01-30 NOTE — Progress Notes (Signed)
Randall Hiss, MD Physician Signed Infectious Disease Progress Notes Service date: 01/28/2013 3:18 PM    [facility]   Staphylococcus aureus bacteremia (SAB) is associated with a high rate of complications and mortality.   Specific aspects of clinical management are critical to optimizing the outcome of patients with SAB. Therefore, the [facility] Antimicrobial Management Team ([facility]) have initiated an intervention aimed at improving the management of SAB at [facility]. To do so, Infectious Diseases Consultants are providing evidence-based recommendations for the management of all patients with SAB. The specific recommendations for this patient are marked "X" in this document.  Recommended [x ] Completed [January 27 2013] Perform follow-up blood cultures (even if the patient is afebrile) to ensure clearance of bacteremia.   Recommended [ ]  Completed [date] Remove vascular catheter, and obtain follow-up cultures after removal of catheter   NA   Recommended Arly.Keller ] Completed [tte ON 01/26/13 AND CONSULT FOR TEE ON MONDAY] Perform echocardiography to evaluate for endocarditis (transthoracic ECHO is 40-50% sensitive, TEE is > 90% sensitive).*   Please keep in mind, that neither test can definitively EXCLUDE endocarditis, and that should clinical suspicion remain high for endocarditis the patient should then still be treated with an "endocarditis" duration of therapy = 6 weeks   WE CONSULTED CARDIOLOGY FOR TEE ON MONDAY  Recommended [ ]  Completed [date] Consult electrophysiologist to evaluate implanted cardiac device (pacemaker, ICD)   NA   Recommended [ X ] Completed [date] Ensure source control.  Have all abscesses been drained effectively?  Have deep seeded infections (septic joints or osteomyelitis) had appropriate surgical debridement?   ORTHOPEDICS CONSULTED FORMALLY and perfromed bedside debridement   Recommended [ X ] Completed [date] Investigate for  "metastatic" sites of infection.  Does the patient have ANY symptom or physical exam finding that would suggest a deeper infection (back or neck pain that may be suggestive of vertebral osteomyelitis or epidural abscess, muscle pain that could be a symptom of pyomyositis)?  Keep in mind that for deep seeded infections MRI imaging with contrast is preferred rather than other often insensitive tests such as plain x-rays, especially early in a patient's presentation.   See above no other source besides feet identfied so far. WILL Question about ANY pain anywhere in body to look for metastatic sources EVERY DAY   Recommended [ X ] Completed [01/30/13] Narrow antibiotic regimen to CEFAZOLIN 2 grams iv q 8 hours which covers BOTH MSSA and GBS  [ x ] Estimated duration of IV Antibiotic therapy:   6 WEEKS  [ X ] Consult case management for probable prolonged outpatient intravenous antibiotic therapy.  Will do this over weekend once we have ensured clearnace of bacteremia and source control.   DO NOT place PICC Until we are sure we have sterilized blood.

## 2013-01-31 ENCOUNTER — Encounter (HOSPITAL_COMMUNITY): Payer: Self-pay | Admitting: Gastroenterology

## 2013-01-31 ENCOUNTER — Encounter (HOSPITAL_COMMUNITY)
Admission: EM | Disposition: A | Discharge status: Home / Self Care | Payer: Self-pay | Source: Home / Self Care | Attending: Infectious Disease

## 2013-01-31 DIAGNOSIS — I339 Acute and subacute endocarditis, unspecified: Secondary | ICD-10-CM

## 2013-01-31 HISTORY — PX: TEE WITHOUT CARDIOVERSION: SHX5443

## 2013-01-31 LAB — BASIC METABOLIC PANEL
BUN: 20 mg/dL (ref 6–23)
CO2: 26 mEq/L (ref 19–32)
Calcium: 8.9 mg/dL (ref 8.4–10.5)
Chloride: 104 mEq/L (ref 96–112)
Creatinine, Ser: 0.66 mg/dL (ref 0.50–1.10)
GFR calc Af Amer: >90 mL/min (ref >90)
GFR calc non Af Amer: >90 mL/min (ref >90)
Glucose, Bld: 65 mg/dL — ABNORMAL LOW (ref 70–99)
Potassium: 3.8 mEq/L (ref 3.5–5.1)
Sodium: 139 mEq/L (ref 135–145)

## 2013-01-31 LAB — CBC
HCT: 35.8 % — ABNORMAL LOW (ref 36.0–46.0)
Hemoglobin: 11.9 g/dL — ABNORMAL LOW (ref 12.0–15.0)
MCH: 31.4 pg (ref 26.0–34.0)
MCHC: 33.2 g/dL (ref 30.0–36.0)
MCV: 94.5 fL (ref 78.0–100.0)
Platelets: 214 10*3/uL (ref 150–400)
RBC: 3.79 MIL/uL — ABNORMAL LOW (ref 3.87–5.11)
RDW: 14.5 % (ref 11.5–15.5)
WBC: 17.5 10*3/uL — ABNORMAL HIGH (ref 4.0–10.5)

## 2013-01-31 LAB — GLUCOSE, CAPILLARY
Glucose-Capillary: 110 mg/dL — ABNORMAL HIGH (ref 70–99)
Glucose-Capillary: 132 mg/dL — ABNORMAL HIGH (ref 70–99)
Glucose-Capillary: 201 mg/dL — ABNORMAL HIGH (ref 70–99)
Glucose-Capillary: 256 mg/dL — ABNORMAL HIGH (ref 70–99)
Glucose-Capillary: 274 mg/dL — ABNORMAL HIGH (ref 70–99)
Glucose-Capillary: 70 mg/dL (ref 70–99)

## 2013-01-31 SURGERY — ECHOCARDIOGRAM, TRANSESOPHAGEAL
Anesthesia: Moderate Sedation

## 2013-01-31 MED ORDER — MIDAZOLAM HCL 5 MG/ML IJ SOLN
INTRAMUSCULAR | Status: AC
  Filled 2013-01-31: qty 1

## 2013-01-31 MED ORDER — LIDOCAINE VISCOUS 2 % MT SOLN
OROMUCOSAL | Status: DC | PRN
  Administered 2013-01-31: 10 mL via OROMUCOSAL

## 2013-01-31 MED ORDER — DEXTROSE 50 % IV SOLN
INTRAVENOUS | Status: AC
  Administered 2013-01-31: 25 mL
  Filled 2013-01-31: qty 50

## 2013-01-31 MED ORDER — FENTANYL CITRATE 0.05 MG/ML IJ SOLN
INTRAMUSCULAR | Status: AC
  Filled 2013-01-31: qty 2

## 2013-01-31 MED ORDER — FENTANYL CITRATE 0.05 MG/ML IJ SOLN
INTRAMUSCULAR | Status: DC | PRN
  Administered 2013-01-31 (×3): 25 ug via INTRAVENOUS

## 2013-01-31 MED ORDER — MIDAZOLAM HCL 10 MG/2ML IJ SOLN
INTRAMUSCULAR | Status: DC | PRN
  Administered 2013-01-31 (×3): 2 mg via INTRAVENOUS

## 2013-01-31 MED ORDER — LIDOCAINE VISCOUS 2 % MT SOLN
OROMUCOSAL | Status: AC
  Filled 2013-01-31: qty 15

## 2013-01-31 NOTE — Consult Note (Signed)
WOC will not follow along any longer wound care for neuropathic foot ulcers being directed per orthopedics s/p debridement.  December [place], [place] [phone]

## 2013-01-31 NOTE — Progress Notes (Signed)
Internal Medicine Teaching Service Attending Note Date: 01/31/2013  Patient name: Jennifer [name]  Medical record number: [id number]  Date of birth: 06/06/1961    This patient has been seen and discussed with the house staff. Please see their note for complete details. I concur with their findings with the following additions/corrections:  Patient has cleared her bacteremia, she is sp debridement at the bedside.   TEE to be done now.  She has no evidence of other metastatic infection  Apparently she had tarry stools by her report but housestaff and then myself when I briefly saw her prior to going down for TEE did not get clarity on this.   Hemoglobin has steadily gone down since admission but gradual decline. If she is TRULY having tarry stools she will need an EGD.  In the meantime I have ordered PICC for her IV ancef  She will need at MINIMUM 4 weeks of IV ancef (even if TEE clear, 6 minimym if positive for endocarditis)  She will need to be seen by me in next two weeks to ensure she is tolerating her abx and her feet are not worsening.  Before this can be pursued we DO need clarity however on her supposed tarry stools    Clarinda Obi [place] 01/31/2013, 3:25 PM  

## 2013-01-31 NOTE — H&P (View-Only) (Signed)
Internal Medicine Teaching Service Attending Note Date: 01/31/2013  Patient name: Jennifer [name]  Medical record number: [id number]  Date of birth: 1961/03/24    This patient has been seen and discussed with the house staff. Please see their note for complete details. I concur with their findings with the following additions/corrections:  Patient has cleared her bacteremia, she is sp debridement at the bedside.   TEE to be done now.  She has no evidence of other metastatic infection  Apparently she had tarry stools by her report but housestaff and then myself when I briefly saw her prior to going down for TEE did not get clarity on this.   Hemoglobin has steadily gone down since admission but gradual decline. If she is TRULY having tarry stools she will need an EGD.  In the meantime I have ordered PICC for her IV ancef  She will need at MINIMUM 4 weeks of IV ancef (even if TEE clear, 6 minimym if positive for endocarditis)  She will need to be seen by me in next two weeks to ensure she is tolerating her abx and her feet are not worsening.  Before this can be pursued we DO need clarity however on her supposed tarry stools    Paulette Blanch Dam 01/31/2013, 3:25 PM

## 2013-01-31 NOTE — Progress Notes (Signed)
ANTIBIOTIC CONSULT NOTE - FOLLOW UP  Pharmacy Consult for Cefazolin Indication: MSSA Bacteremia  Allergies  Allergen Reactions  . 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 Morphine Sulfate     REACTION: hives  . Vicodin (Hydrocodone-Acetaminophen) Itching    Patient Measurements: Height: 5\' 7"  (170.2 cm) Weight: 259 lb 0.7 oz (117.5 kg) IBW/kg (Calculated) : 61.6 Adjusted Body Weight:   Vital Signs: Temp: 98 F (36.7 C) (02/10 0500) Temp src: Oral (02/10 0500) BP: 153/72 mmHg (02/10 0545) Pulse Rate: 55 (02/10 0500) Intake/Output from previous day: 02/09 0701 - 02/10 0700 In: 840 [P.O.:840] Out: -  Intake/Output from this shift:    Labs:  Recent Labs  01/29/13 0600 01/29/13 1747 01/30/13 0615 01/31/13 0530  WBC  --  10.4 14.4* 17.5*  HGB  --  12.3 12.2 11.9*  PLT  --  195 194 214  CREATININE 0.71  --  0.74 0.66   Estimated Creatinine Clearance: 107.8 ml/min (by C-G formula based on Cr of 0.66). No results found for this basename: VANCOTROUGH, VANCOPEAK, VANCORANDOM, GENTTROUGH, GENTPEAK, GENTRANDOM, TOBRATROUGH, TOBRAPEAK, TOBRARND, AMIKACINPEAK, AMIKACINTROU, AMIKACIN,  in the last 72 hours   Microbiology: Recent Results (from the past 720 hour(s))  CULTURE, BLOOD (ROUTINE X 2)     Status: None   Collection Time    01/26/13 12:30 AM      Result Value Range Status   Specimen Description BLOOD ARM LEFT   Final   Special Requests BOTTLES DRAWN AEROBIC AND ANAEROBIC 10CC   Final   Culture  Setup Time 01/26/2013 03:50   Final   Culture     Final   Value: STAPHYLOCOCCUS AUREUS     Note: RIFAMPIN AND GENTAMICIN SHOULD NOT BE USED AS SINGLE DRUGS FOR TREATMENT OF STAPH INFECTIONS.     GROUP B STREP(S.AGALACTIAE)ISOLATED     Note: Gram Stain Report Called to,Read Back By and Verified With: CHRISTY JOHNSON ON 01/26/2013 AT 6:50P BY WILEJ   Report Status 01/29/2013 FINAL   Final   Organism ID, Bacteria  STAPHYLOCOCCUS AUREUS   Final   Organism ID, Bacteria GROUP B STREP(S.AGALACTIAE)ISOLATED   Final  CULTURE, BLOOD (ROUTINE X 2)     Status: None   Collection Time    01/26/13 12:45 AM      Result Value Range Status   Specimen Description BLOOD HAND RIGHT   Final   Special Requests BOTTLES DRAWN AEROBIC AND ANAEROBIC 10CC   Final   Culture  Setup Time 01/26/2013 03:50   Final   Culture     Final   Value: STAPHYLOCOCCUS AUREUS     GROUP B STREP(S.AGALACTIAE)ISOLATED     Note: Gram Stain Report Called to,Read Back By and Verified With: CHRISTY JOHNSON ON 01/26/2013 AT 6:50P BY WILEJ   Report Status 01/29/2013 FINAL   Final  CULTURE, BLOOD (ROUTINE X 2)     Status: None   Collection Time    01/27/13  2:55 PM      Result Value Range Status   Specimen Description BLOOD LEFT ARM   Final   Special Requests BOTTLES DRAWN AEROBIC ONLY 10CC   Final   Culture  Setup Time 01/27/2013 22:01   Final   Culture     Final   Value:        BLOOD CULTURE RECEIVED NO GROWTH TO DATE CULTURE WILL BE HELD FOR 5 DAYS BEFORE ISSUING A FINAL NEGATIVE REPORT  Report Status PENDING   Incomplete  CULTURE, BLOOD (ROUTINE X 2)     Status: None   Collection Time    01/27/13  3:05 PM      Result Value Range Status   Specimen Description BLOOD LEFT HAND   Final   Special Requests BOTTLES DRAWN AEROBIC ONLY 10CC   Final   Culture  Setup Time 01/27/2013 22:01   Final   Culture     Final   Value:        BLOOD CULTURE RECEIVED NO GROWTH TO DATE CULTURE WILL BE HELD FOR 5 DAYS BEFORE ISSUING A FINAL NEGATIVE REPORT   Report Status PENDING   Incomplete  CULTURE, BLOOD (ROUTINE X 2)     Status: None   Collection Time    01/29/13  2:10 PM      Result Value Range Status   Specimen Description BLOOD LEFT ARM   Final   Special Requests BOTTLES DRAWN AEROBIC ONLY 4CC   Final   Culture  Setup Time 01/29/2013 20:27   Final   Culture     Final   Value:        BLOOD CULTURE RECEIVED NO GROWTH TO DATE CULTURE WILL BE HELD FOR 5  DAYS BEFORE ISSUING A FINAL NEGATIVE REPORT   Report Status PENDING   Incomplete  CULTURE, BLOOD (ROUTINE X 2)     Status: None   Collection Time    01/29/13  2:20 PM      Result Value Range Status   Specimen Description BLOOD LEFT HAND   Final   Special Requests BOTTLES DRAWN AEROBIC ONLY 6CC   Final   Culture  Setup Time 01/29/2013 21:22   Final   Culture     Final   Value:        BLOOD CULTURE RECEIVED NO GROWTH TO DATE CULTURE WILL BE HELD FOR 5 DAYS BEFORE ISSUING A FINAL NEGATIVE REPORT   Report Status PENDING   Incomplete  CULTURE, BLOOD (ROUTINE X 2)     Status: None   Collection Time    01/30/13  3:20 PM      Result Value Range Status   Specimen Description BLOOD RIGHT HAND   Final   Special Requests BOTTLES DRAWN AEROBIC AND ANAEROBIC 10CC   Final   Culture  Setup Time 01/30/2013 23:16   Final   Culture     Final   Value:        BLOOD CULTURE RECEIVED NO GROWTH TO DATE CULTURE WILL BE HELD FOR 5 DAYS BEFORE ISSUING A FINAL NEGATIVE REPORT   Report Status PENDING   Incomplete  CULTURE, BLOOD (ROUTINE X 2)     Status: None   Collection Time    01/30/13  3:28 PM      Result Value Range Status   Specimen Description BLOOD LEFT HAND   Final   Special Requests BOTTLES DRAWN AEROBIC ONLY 10CC   Final   Culture  Setup Time 01/30/2013 23:16   Final   Culture     Final   Value:        BLOOD CULTURE RECEIVED NO GROWTH TO DATE CULTURE WILL BE HELD FOR 5 DAYS BEFORE ISSUING A FINAL NEGATIVE REPORT   Report Status PENDING   Incomplete    Anti-infectives   Start     Dose/Rate Route Frequency Ordered Stop   01/28/13 1630  ceFAZolin (ANCEF) IVPB 1 g/50 mL premix  Status:  Discontinued     1  g 100 mL/hr over 30 Minutes Intravenous 3 times per day 01/28/13 1618 01/28/13 1620   01/28/13 1630  ceFAZolin (ANCEF) IVPB 2 g/50 mL premix     2 g 100 mL/hr over 30 Minutes Intravenous 3 times per day 01/28/13 1620     01/27/13 1230  cefTRIAXone (ROCEPHIN) 2 g in dextrose 5 % 50 mL IVPB   Status:  Discontinued     2 g 100 mL/hr over 30 Minutes Intravenous Every 24 hours 01/27/13 1120 01/28/13 1527   01/27/13 0800  vancomycin (VANCOCIN) IVPB 1000 mg/200 mL premix  Status:  Discontinued     1,000 mg 200 mL/hr over 60 Minutes Intravenous Every 12 hours 01/26/13 2037 01/30/13 1333   01/26/13 2045  vancomycin (VANCOCIN) 2,500 mg in sodium chloride 0.9 % 500 mL IVPB     2,500 mg 250 mL/hr over 120 Minutes Intravenous  Once 01/26/13 2037 01/27/13 0018   01/26/13 0230  doxycycline (VIBRA-TABS) tablet 100 mg  Status:  Discontinued     100 mg Oral Every 12 hours 01/26/13 0227 01/26/13 1238   01/26/13 0015  vancomycin (VANCOCIN) IVPB 1000 mg/200 mL premix     1,000 mg 200 mL/hr over 60 Minutes Intravenous  Once 01/26/13 0003 01/26/13 0155      Assessment: 51yo female with MSSA bacteremia 2nd BLE diabetic foot wounds.  Today is day# 4 of Cefazolin 2gm IV q8; she is to have TEE today to R/O endocarditis.  Plan is for probable 6wk of antibiotics and PICC when bacteremia is cleared.  The blood cx x 4 from 2/8 & 2/9 are all NTD at this point.  WBC has a significant upward trend over the last 2 days; pt is afebrile.  Cr has been stable at < 1.  Goal of Therapy:  Resolution of infection  Plan:  Continue Cefazolin 2gm IV q8  Marisue Humble, PharmD Clinical Pharmacist [facility]

## 2013-01-31 NOTE — Progress Notes (Signed)
  Echocardiogram Echocardiogram Transesophageal has been performed.  Jennifer [name] 01/31/2013, 5:37 PM

## 2013-01-31 NOTE — H&P (View-Only) (Signed)
Subjective: Pt doing better this AM. Is wanting to go home and awaiting procedure.   Objective: Vital signs in last 24 hours: Filed Vitals:   01/30/13 2100 01/31/13 0500 01/31/13 0545 01/31/13 0834  BP: 154/77 183/84 153/72   Pulse: 56 55    Temp: 97.5 F (36.4 C) 98 F (36.7 C)    TempSrc: Oral Oral    Resp: 18 16    Height:      Weight:      SpO2: 97% 98%  96%   Weight change:   Intake/Output Summary (Last 24 hours) at 01/31/13 1304 Last data filed at 01/31/13 0830  Gross per 24 hour  Intake      0 ml  Output      0 ml  Net      0 ml   GEN: Alert, not in acute distress, obese female, lying in bed with legs elevated  HEENT: PERRLA, EOMI, supple neck  CV: RRR, no murmurs, no gallops, no rubs  PULM: not in acute respiratory distress, diminished lung sounds, symmetrical bilateral lung expansion  ABD: some mild diffuse tenderness, no distended  NEURO: A&Ox3, CNII-XII intact, numbness in LE bilaterally  Extremities: 1+ pedal edema bilaterally, <2s capillary refill on fingernail beds, ulcer on plantar surface of both feet. Left foot ulcer has hard eschar. No drainage from either ulcer.   Lab Results: Basic Metabolic Panel:  Recent Labs Lab 01/29/13 0600 01/29/13 1230 01/30/13 0615 01/31/13 0530  NA 139  --  139 139  K 3.3*  --  3.9 3.8  CL 102  --  103 104  CO2 25  --  25 26  GLUCOSE 134*  --  158* 65*  BUN 24*  --  19 20  CREATININE 0.71  --  0.74 0.66  CALCIUM 8.9  --  9.0 8.9  MG  --  2.1  --   --    Liver Function Tests:  Recent Labs Lab 01/26/13 0030  AST 32  ALT 32  ALKPHOS 94  BILITOT 0.8  PROT 6.9  ALBUMIN 3.1*   CBC:  Recent Labs Lab 01/25/13 2142  01/30/13 0615 01/31/13 0530  WBC 12.2*  < > 14.4* 17.5*  NEUTROABS 9.7*  --   --   --   HGB 13.6  < > 12.2 11.9*  HCT 40.4  < > 36.2 35.8*  MCV 94.8  < > 94.0 94.5  PLT 113*  < > 194 214  < > = values in this interval not displayed. Cardiac Enzymes:  Recent Labs Lab 01/26/13 0451   TROPONINI <0.30   BNP:  Recent Labs Lab 01/26/13 0028  PROBNP 727.3*   CBG:  Recent Labs Lab 01/30/13 1123 01/30/13 1656 01/30/13 2124 01/31/13 0719 01/31/13 0812 01/31/13 1120  GLUCAP 165* 242* 153* 70 110* 132*   Hemoglobin A1C:  Recent Labs Lab 01/26/13 0030  HGBA1C 9.5*   Thyroid Function Tests:  Recent Labs Lab 01/26/13 0156  TSH 0.815   Coagulation: No results found for this basename: LABPROT, INR,  in the last 168 hours Anemia Panel:  Recent Labs Lab 01/26/13 0156  VITAMINB12 614   Urine Drug Screen: Drugs of Abuse     Component Value Date/Time   LABOPIA NONE DETECTED 01/26/2013 0036   LABOPIA SEE NOTE 07/21/2012 1631   COCAINSCRNUR NONE DETECTED 01/26/2013 0036   COCAINSCRNUR NEG 07/21/2012 1631   LABBENZ NONE DETECTED 01/26/2013 0036   LABBENZ NEG 07/21/2012 1631   LABBENZ NEG 06/28/2010 2130  AMPHETMU NONE DETECTED 01/26/2013 0036   AMPHETMU NEG 06/28/2010 2130   THCU NONE DETECTED 01/26/2013 0036   LABBARB NONE DETECTED 01/26/2013 0036   LABBARB NEG 07/21/2012 1631    Urinalysis:  Recent Labs Lab 01/26/13 0036  COLORURINE YELLOW  LABSPEC 1.033*  PHURINE 6.0  GLUCOSEU >1000*  HGBUR NEGATIVE  BILIRUBINUR NEGATIVE  KETONESUR 15*  PROTEINUR NEGATIVE  UROBILINOGEN 1.0  NITRITE NEGATIVE  LEUKOCYTESUR NEGATIVE    Micro Results: Recent Results (from the past 240 hour(s))  CULTURE, BLOOD (ROUTINE X 2)     Status: None   Collection Time    01/26/13 12:30 AM      Result Value Range Status   Specimen Description BLOOD ARM LEFT   Final   Special Requests BOTTLES DRAWN AEROBIC AND ANAEROBIC 10CC   Final   Culture  Setup Time 01/26/2013 03:50   Final   Culture     Final   Value: STAPHYLOCOCCUS AUREUS     Note: RIFAMPIN AND GENTAMICIN SHOULD NOT BE USED AS SINGLE DRUGS FOR TREATMENT OF STAPH INFECTIONS.     GROUP B STREP(S.AGALACTIAE)ISOLATED     Note: Gram Stain Report Called to,Read Back By and Verified With: CHRISTY JOHNSON ON 01/26/2013 AT 6:50P  BY WILEJ   Report Status 01/29/2013 FINAL   Final   Organism ID, Bacteria STAPHYLOCOCCUS AUREUS   Final   Organism ID, Bacteria GROUP B STREP(S.AGALACTIAE)ISOLATED   Final  CULTURE, BLOOD (ROUTINE X 2)     Status: None   Collection Time    01/26/13 12:45 AM      Result Value Range Status   Specimen Description BLOOD HAND RIGHT   Final   Special Requests BOTTLES DRAWN AEROBIC AND ANAEROBIC 10CC   Final   Culture  Setup Time 01/26/2013 03:50   Final   Culture     Final   Value: STAPHYLOCOCCUS AUREUS     GROUP B STREP(S.AGALACTIAE)ISOLATED     Note: Gram Stain Report Called to,Read Back By and Verified With: CHRISTY JOHNSON ON 01/26/2013 AT 6:50P BY WILEJ   Report Status 01/29/2013 FINAL   Final  CULTURE, BLOOD (ROUTINE X 2)     Status: None   Collection Time    01/27/13  2:55 PM      Result Value Range Status   Specimen Description BLOOD LEFT ARM   Final   Special Requests BOTTLES DRAWN AEROBIC ONLY 10CC   Final   Culture  Setup Time 01/27/2013 22:01   Final   Culture     Final   Value:        BLOOD CULTURE RECEIVED NO GROWTH TO DATE CULTURE WILL BE HELD FOR 5 DAYS BEFORE ISSUING A FINAL NEGATIVE REPORT   Report Status PENDING   Incomplete  CULTURE, BLOOD (ROUTINE X 2)     Status: None   Collection Time    01/27/13  3:05 PM      Result Value Range Status   Specimen Description BLOOD LEFT HAND   Final   Special Requests BOTTLES DRAWN AEROBIC ONLY 10CC   Final   Culture  Setup Time 01/27/2013 22:01   Final   Culture     Final   Value:        BLOOD CULTURE RECEIVED NO GROWTH TO DATE CULTURE WILL BE HELD FOR 5 DAYS BEFORE ISSUING A FINAL NEGATIVE REPORT   Report Status PENDING   Incomplete  CULTURE, BLOOD (ROUTINE X 2)     Status: None   Collection Time  01/29/13  2:10 PM      Result Value Range Status   Specimen Description BLOOD LEFT ARM   Final   Special Requests BOTTLES DRAWN AEROBIC ONLY 4CC   Final   Culture  Setup Time 01/29/2013 20:27   Final   Culture     Final   Value:         BLOOD CULTURE RECEIVED NO GROWTH TO DATE CULTURE WILL BE HELD FOR 5 DAYS BEFORE ISSUING A FINAL NEGATIVE REPORT   Report Status PENDING   Incomplete  CULTURE, BLOOD (ROUTINE X 2)     Status: None   Collection Time    01/29/13  2:20 PM      Result Value Range Status   Specimen Description BLOOD LEFT HAND   Final   Special Requests BOTTLES DRAWN AEROBIC ONLY 6CC   Final   Culture  Setup Time 01/29/2013 21:22   Final   Culture     Final   Value:        BLOOD CULTURE RECEIVED NO GROWTH TO DATE CULTURE WILL BE HELD FOR 5 DAYS BEFORE ISSUING A FINAL NEGATIVE REPORT   Report Status PENDING   Incomplete  CULTURE, BLOOD (ROUTINE X 2)     Status: None   Collection Time    01/30/13  3:20 PM      Result Value Range Status   Specimen Description BLOOD RIGHT HAND   Final   Special Requests BOTTLES DRAWN AEROBIC AND ANAEROBIC 10CC   Final   Culture  Setup Time 01/30/2013 23:16   Final   Culture     Final   Value:        BLOOD CULTURE RECEIVED NO GROWTH TO DATE CULTURE WILL BE HELD FOR 5 DAYS BEFORE ISSUING A FINAL NEGATIVE REPORT   Report Status PENDING   Incomplete  CULTURE, BLOOD (ROUTINE X 2)     Status: None   Collection Time    01/30/13  3:28 PM      Result Value Range Status   Specimen Description BLOOD LEFT HAND   Final   Special Requests BOTTLES DRAWN AEROBIC ONLY 10CC   Final   Culture  Setup Time 01/30/2013 23:16   Final   Culture     Final   Value:        BLOOD CULTURE RECEIVED NO GROWTH TO DATE CULTURE WILL BE HELD FOR 5 DAYS BEFORE ISSUING A FINAL NEGATIVE REPORT   Report Status PENDING   Incomplete   Studies/Results: No results found. Medications: I have reviewed the patient's current medications. Scheduled Meds: . aspirin  81 mg Oral Daily  . atenolol  100 mg Oral Daily  . benazepril  40 mg Oral Daily  .  ceFAZolin (ANCEF) IV  2 g Intravenous Q8H  . clopidogrel  75 mg Oral Daily  . escitalopram  20 mg Oral Daily  . heparin  5,000 Units Subcutaneous Q8H  .  hydrochlorothiazide  25 mg Oral Daily  . insulin aspart  0-15 Units Subcutaneous TID WC  . insulin aspart  0-5 Units Subcutaneous QHS  . insulin aspart protamine-insulin aspart  125 Units Subcutaneous Q breakfast  . insulin aspart protamine-insulin aspart  65 Units Subcutaneous Q supper  . ipratropium  0.5 mg Nebulization BID  . levalbuterol  0.63 mg Nebulization BID  . mometasone-formoterol  2 puff Inhalation BID  . nicotine  14 mg Transdermal Q24H  . pantoprazole  80 mg Oral Daily  . predniSONE  60 mg Oral  Q breakfast  . pregabalin  100 mg Oral TID  . silver sulfADIAZINE   Topical Daily  . simvastatin  20 mg Oral q1800  . sodium chloride  3 mL Intravenous Q12H   Continuous Infusions:  PRN Meds:.sodium chloride, acetaminophen, acetaminophen, albuterol, benzonatate, ondansetron (ZOFRAN) IV, ondansetron, oxyCODONE, sodium chloride Assessment/Plan:  1. Bacteremia,  01/26/13 blood cultures 2/2 STAPHYLOCOCCUS AUREUS and GROUP B STREP(S.AGALACTIAE) 2/6 blood culture x 2 pending but NGTD 2/8 blood culture x 2 pending but NGTD - pt on ancef at this time - TEE scheduled today to r/o endocarditis -will need PICC placement for extended course - restart insulin after procedure  2. Dark stools pt had no dark stools or frank BRBPR today.  - CBC stable - FOBT positive will do rectal exam today - patient is on Heaprin SQ now. No obvious bleeding or anemia. Will monitor.    3. B/L feet ulcers and LE swelling  - MRI Negative for OM  - LE Doppler negative for DVT  - ABI L 0.92 and R 0.93 - Orthopedic surgeon consulted--recs wound care center and dressing changes after bedside debridement 01/30/13  4. DM2  - consistent high CBG of 200-300 on home dose of Novolog 70/30 125 units in am and 65 units in PM  -will restart insulin after procedure  Other chronic problems are stable.   Dispo: Disposition is deferred at this time, awaiting improvement of current medical problems.  Anticipated discharge  in approximately 3-4 day(s).   The patient does have a current PCP Blanch Media, MD), therefore is requiring OPC follow-up after discharge.   The patient does not have transportation limitations that hinder transportation to clinic appointments.  .Services Needed at time of discharge: Y = Yes, Blank = No PT:   OT:   RN:   Equipment:   Other:     LOS: 6 days   Christen Bame 01/31/2013, 1:04 PM [phone]

## 2013-01-31 NOTE — Progress Notes (Signed)
Patient is currently active with long-term disease management services with [facility]. Patient will receive a post discharge transition of care call and continued monthly home visits for assessments and for education. Spoke with patient at bedside to make aware that [facility] Care Management will continue to follow. She reports she has had [facility] before in the past. Appears she will need home health care services at discharge. She could benefit from RN services in addition to her therapy services, She states she will need a rolling walker and 3 in 1 as well. She also reports she will be going to the wound clinic as well after discharge. Lives with family. Will discuss above information with inpatient case manager. Left contact information with patient. Appreciative of visit.  Raiford Noble, MSN-Ed, RN,BSN, [facility], [phone]

## 2013-01-31 NOTE — Progress Notes (Signed)
Subjective: Pt doing better this AM. Is wanting to go home and awaiting procedure.   Objective: Vital signs in last 24 hours: Filed Vitals:   01/30/13 2100 01/31/13 0500 01/31/13 0545 01/31/13 0834  BP: 154/77 183/84 153/72   Pulse: 56 55    Temp: 97.5 F (36.4 C) 98 F (36.7 C)    TempSrc: Oral Oral    Resp: 18 16    Height:      Weight:      SpO2: 97% 98%  96%   Weight change:   Intake/Output Summary (Last 24 hours) at 01/31/13 1304 Last data filed at 01/31/13 0830  Gross per 24 hour  Intake      0 ml  Output      0 ml  Net      0 ml   GEN: Alert, not in acute distress, obese female, lying in bed with legs elevated  HEENT: PERRLA, EOMI, supple neck  CV: RRR, no murmurs, no gallops, no rubs  PULM: not in acute respiratory distress, diminished lung sounds, symmetrical bilateral lung expansion  ABD: some mild diffuse tenderness, no distended  NEURO: A&Ox3, CNII-XII intact, numbness in LE bilaterally  Extremities: 1+ pedal edema bilaterally, <2s capillary refill on fingernail beds, ulcer on plantar surface of both feet. Left foot ulcer has hard eschar. No drainage from either ulcer.   Lab Results: Basic Metabolic Panel:  Recent Labs Lab 01/29/13 0600 01/29/13 1230 01/30/13 0615 01/31/13 0530  NA 139  --  139 139  K 3.3*  --  3.9 3.8  CL 102  --  103 104  CO2 25  --  25 26  GLUCOSE 134*  --  158* 65*  BUN 24*  --  19 20  CREATININE 0.71  --  0.74 0.66  CALCIUM 8.9  --  9.0 8.9  MG  --  2.1  --   --    Liver Function Tests:  Recent Labs Lab 01/26/13 0030  AST 32  ALT 32  ALKPHOS 94  BILITOT 0.8  PROT 6.9  ALBUMIN 3.1*   CBC:  Recent Labs Lab 01/25/13 2142  01/30/13 0615 01/31/13 0530  WBC 12.2*  < > 14.4* 17.5*  NEUTROABS 9.7*  --   --   --   HGB 13.6  < > 12.2 11.9*  HCT 40.4  < > 36.2 35.8*  MCV 94.8  < > 94.0 94.5  PLT 113*  < > 194 214  < > = values in this interval not displayed. Cardiac Enzymes:  Recent Labs Lab 01/26/13 0451   TROPONINI <0.30   BNP:  Recent Labs Lab 01/26/13 0028  PROBNP 727.3*   CBG:  Recent Labs Lab 01/30/13 1123 01/30/13 1656 01/30/13 2124 01/31/13 0719 01/31/13 0812 01/31/13 1120  GLUCAP 165* 242* 153* 70 110* 132*   Hemoglobin A1C:  Recent Labs Lab 01/26/13 0030  HGBA1C 9.5*   Thyroid Function Tests:  Recent Labs Lab 01/26/13 0156  TSH 0.815   Coagulation: No results found for this basename: LABPROT, INR,  in the last 168 hours Anemia Panel:  Recent Labs Lab 01/26/13 0156  VITAMINB12 614   Urine Drug Screen: Drugs of Abuse     Component Value Date/Time   LABOPIA NONE DETECTED 01/26/2013 0036   LABOPIA SEE NOTE 07/21/2012 1631   COCAINSCRNUR NONE DETECTED 01/26/2013 0036   COCAINSCRNUR NEG 07/21/2012 1631   LABBENZ NONE DETECTED 01/26/2013 0036   LABBENZ NEG 07/21/2012 1631   LABBENZ NEG 06/28/2010 2130     AMPHETMU NONE DETECTED 01/26/2013 0036   AMPHETMU NEG 06/28/2010 2130   THCU NONE DETECTED 01/26/2013 0036   LABBARB NONE DETECTED 01/26/2013 0036   LABBARB NEG 07/21/2012 1631    Urinalysis:  Recent Labs Lab 01/26/13 0036  COLORURINE YELLOW  LABSPEC 1.033*  PHURINE 6.0  GLUCOSEU >1000*  HGBUR NEGATIVE  BILIRUBINUR NEGATIVE  KETONESUR 15*  PROTEINUR NEGATIVE  UROBILINOGEN 1.0  NITRITE NEGATIVE  LEUKOCYTESUR NEGATIVE    Micro Results: Recent Results (from the past 240 hour(s))  CULTURE, BLOOD (ROUTINE X 2)     Status: None   Collection Time    01/26/13 12:30 AM      Result Value Range Status   Specimen Description BLOOD ARM LEFT   Final   Special Requests BOTTLES DRAWN AEROBIC AND ANAEROBIC 10CC   Final   Culture  Setup Time 01/26/2013 03:50   Final   Culture     Final   Value: STAPHYLOCOCCUS AUREUS     Note: RIFAMPIN AND GENTAMICIN SHOULD NOT BE USED AS SINGLE DRUGS FOR TREATMENT OF STAPH INFECTIONS.     GROUP B STREP(S.AGALACTIAE)ISOLATED     Note: Gram Stain Report Called to,Read Back By and Verified With: Jennifer [name] ON 01/26/2013 AT 6:50P  BY WILEJ   Report Status 01/29/2013 FINAL   Final   Organism ID, Bacteria STAPHYLOCOCCUS AUREUS   Final   Organism ID, Bacteria GROUP B STREP(S.AGALACTIAE)ISOLATED   Final  CULTURE, BLOOD (ROUTINE X 2)     Status: None   Collection Time    01/26/13 12:45 AM      Result Value Range Status   Specimen Description BLOOD HAND RIGHT   Final   Special Requests BOTTLES DRAWN AEROBIC AND ANAEROBIC 10CC   Final   Culture  Setup Time 01/26/2013 03:50   Final   Culture     Final   Value: STAPHYLOCOCCUS AUREUS     GROUP B STREP(S.AGALACTIAE)ISOLATED     Note: Gram Stain Report Called to,Read Back By and Verified With: Jennifer [name] ON 01/26/2013 AT 6:50P BY WILEJ   Report Status 01/29/2013 FINAL   Final  CULTURE, BLOOD (ROUTINE X 2)     Status: None   Collection Time    01/27/13  2:55 PM      Result Value Range Status   Specimen Description BLOOD LEFT ARM   Final   Special Requests BOTTLES DRAWN AEROBIC ONLY 10CC   Final   Culture  Setup Time 01/27/2013 22:01   Final   Culture     Final   Value:        BLOOD CULTURE RECEIVED NO GROWTH TO DATE CULTURE WILL BE HELD FOR 5 DAYS BEFORE ISSUING A FINAL NEGATIVE REPORT   Report Status PENDING   Incomplete  CULTURE, BLOOD (ROUTINE X 2)     Status: None   Collection Time    01/27/13  3:05 PM      Result Value Range Status   Specimen Description BLOOD LEFT HAND   Final   Special Requests BOTTLES DRAWN AEROBIC ONLY 10CC   Final   Culture  Setup Time 01/27/2013 22:01   Final   Culture     Final   Value:        BLOOD CULTURE RECEIVED NO GROWTH TO DATE CULTURE WILL BE HELD FOR 5 DAYS BEFORE ISSUING A FINAL NEGATIVE REPORT   Report Status PENDING   Incomplete  CULTURE, BLOOD (ROUTINE X 2)     Status: None   Collection Time      01/29/13  2:10 PM      Result Value Range Status   Specimen Description BLOOD LEFT ARM   Final   Special Requests BOTTLES DRAWN AEROBIC ONLY 4CC   Final   Culture  Setup Time 01/29/2013 20:27   Final   Culture     Final   Value:         BLOOD CULTURE RECEIVED NO GROWTH TO DATE CULTURE WILL BE HELD FOR 5 DAYS BEFORE ISSUING A FINAL NEGATIVE REPORT   Report Status PENDING   Incomplete  CULTURE, BLOOD (ROUTINE X 2)     Status: None   Collection Time    01/29/13  2:20 PM      Result Value Range Status   Specimen Description BLOOD LEFT HAND   Final   Special Requests BOTTLES DRAWN AEROBIC ONLY 6CC   Final   Culture  Setup Time 01/29/2013 21:22   Final   Culture     Final   Value:        BLOOD CULTURE RECEIVED NO GROWTH TO DATE CULTURE WILL BE HELD FOR 5 DAYS BEFORE ISSUING A FINAL NEGATIVE REPORT   Report Status PENDING   Incomplete  CULTURE, BLOOD (ROUTINE X 2)     Status: None   Collection Time    01/30/13  3:20 PM      Result Value Range Status   Specimen Description BLOOD RIGHT HAND   Final   Special Requests BOTTLES DRAWN AEROBIC AND ANAEROBIC 10CC   Final   Culture  Setup Time 01/30/2013 23:16   Final   Culture     Final   Value:        BLOOD CULTURE RECEIVED NO GROWTH TO DATE CULTURE WILL BE HELD FOR 5 DAYS BEFORE ISSUING A FINAL NEGATIVE REPORT   Report Status PENDING   Incomplete  CULTURE, BLOOD (ROUTINE X 2)     Status: None   Collection Time    01/30/13  3:28 PM      Result Value Range Status   Specimen Description BLOOD LEFT HAND   Final   Special Requests BOTTLES DRAWN AEROBIC ONLY 10CC   Final   Culture  Setup Time 01/30/2013 23:16   Final   Culture     Final   Value:        BLOOD CULTURE RECEIVED NO GROWTH TO DATE CULTURE WILL BE HELD FOR 5 DAYS BEFORE ISSUING A FINAL NEGATIVE REPORT   Report Status PENDING   Incomplete   Studies/Results: No results found. Medications: I have reviewed the patient's current medications. Scheduled Meds: . aspirin  81 mg Oral Daily  . atenolol  100 mg Oral Daily  . benazepril  40 mg Oral Daily  .  ceFAZolin (ANCEF) IV  2 g Intravenous Q8H  . clopidogrel  75 mg Oral Daily  . escitalopram  20 mg Oral Daily  . heparin  5,000 Units Subcutaneous Q8H  .  hydrochlorothiazide  25 mg Oral Daily  . insulin aspart  0-15 Units Subcutaneous TID WC  . insulin aspart  0-5 Units Subcutaneous QHS  . insulin aspart protamine-insulin aspart  125 Units Subcutaneous Q breakfast  . insulin aspart protamine-insulin aspart  65 Units Subcutaneous Q supper  . ipratropium  0.5 mg Nebulization BID  . levalbuterol  0.63 mg Nebulization BID  . mometasone-formoterol  2 puff Inhalation BID  . nicotine  14 mg Transdermal Q24H  . pantoprazole  80 mg Oral Daily  . predniSONE  60 mg Oral   Q breakfast  . pregabalin  100 mg Oral TID  . silver sulfADIAZINE   Topical Daily  . simvastatin  20 mg Oral q1800  . sodium chloride  3 mL Intravenous Q12H   Continuous Infusions:  PRN Meds:.sodium chloride, acetaminophen, acetaminophen, albuterol, benzonatate, ondansetron (ZOFRAN) IV, ondansetron, oxyCODONE, sodium chloride Assessment/Plan:  1. Bacteremia,  01/26/13 blood cultures 2/2 STAPHYLOCOCCUS AUREUS and GROUP B STREP(S.AGALACTIAE) 2/6 blood culture x 2 pending but NGTD 2/8 blood culture x 2 pending but NGTD - pt on ancef at this time - TEE scheduled today to r/o endocarditis -will need PICC placement for extended course - restart insulin after procedure  2. Dark stools pt had no dark stools or frank BRBPR today.  - CBC stable - FOBT positive will do rectal exam today - patient is on Heaprin SQ now. No obvious bleeding or anemia. Will monitor.    3. B/L feet ulcers and LE swelling  - MRI Negative for OM  - LE Doppler negative for DVT  - ABI L 0.92 and R 0.93 - Orthopedic surgeon consulted--recs wound care center and dressing changes after bedside debridement 01/30/13  4. DM2  - consistent high CBG of 200-300 on home dose of Novolog 70/30 125 units in am and 65 units in PM  -will restart insulin after procedure  Other chronic problems are stable.   Dispo: Disposition is deferred at this time, awaiting improvement of current medical problems.  Anticipated discharge  in approximately 3-4 day(s).   The patient does have a current PCP (BUTCHER,ELIZABETH, MD), therefore is requiring OPC follow-up after discharge.   The patient does not have transportation limitations that hinder transportation to clinic appointments.  .Services Needed at time of discharge: Y = Yes, Blank = No PT:   OT:   RN:   Equipment:   Other:     LOS: 6 days   Jennifer [name] 01/31/2013, 1:04 PM [phone] 

## 2013-01-31 NOTE — Op Note (Signed)
Full report to follow in echo section.

## 2013-01-31 NOTE — Interval H&P Note (Signed)
History and Physical Interval Note:  01/31/2013 4:28 PM  Jennifer [name]  has presented today for surgery, with the diagnosis of Bacteremia, R/o Endocarditis  The various methods of treatment have been discussed with the patient and family. After consideration of risks, benefits and other options for treatment, the patient has consented to  Procedure(s) with comments: TRANSESOPHAGEAL ECHOCARDIOGRAM (TEE) (N/A) - Rm 4N82 as a surgical intervention .  The patient's history has been reviewed, patient examined, no change in status, stable for surgery.  I have reviewed the patient's chart and labs.  Questions were answered to the patient's satisfaction.     Dietrich Pates

## 2013-01-31 NOTE — Progress Notes (Cosign Needed)
Medical Student Daily Progress Note  Subjective: Patient reports that she has not felt dizzy since PT came and performed canalith repositioning. Patient also does not complain of new pain and states that the pain in her right leg remain the same. Patient has not noticed any signs of inflammation after R foot debridement. Patient did notice increased frequency in BM after change in antibiotics.  ROS: Resp: cough improved from admission, but still present GI: no nausea/vomiting, no blood in stool today Objective: Vital signs in last 24 hours: Filed Vitals:   01/30/13 1530 01/30/13 2100 01/31/13 0500 01/31/13 0545  BP: 158/78 154/77 183/84 153/72  Pulse: 58 56 55   Temp: 98.1 F (36.7 C) 97.5 F (36.4 C) 98 F (36.7 C)   TempSrc: Oral Oral Oral   Resp: 18 18 16    Height:      Weight:      SpO2: 97% 97% 98%    Weight change:   Intake/Output Summary (Last 24 hours) at 01/31/13 0739 Last data filed at 01/30/13 1300  Gross per 24 hour  Intake    840 ml  Output      0 ml  Net    840 ml   Physical Exam: BP 153/72  Pulse 55  Temp(Src) 98 F (36.7 C) (Oral)  Resp 16  Ht 5\' 7"  (1.702 m)  Wt 117.5 kg (259 lb 0.7 oz)  BMI 40.56 kg/m2  SpO2 96% GEN: Alert, not in acute distress, obese female, lying in bed with legs elevated  HEENT: PERRLA, EOMI, MMM, supple neck, left eye shows mild exophthalmos  CV: RRR, no murmurs, no gallops, no rubs  PULM: no WOB, not in acute respiratory distress, diminished lung sounds, symmetrical bilateral lung expansion  ABDO: NDNT  NEURO: A&Ox3, CNII-XII intact, numbness in LE bilaterally  MSK: 4/5 strength in R leg from hip down, 5/5 in L leg, extreme pain in R leg from hip down on contact, no change in warmth or swelling R leg from admission  Extremities: 1+ pedal edema bilaterally, <2s capillary refill on fingernail beds   Lab Results: BMET    Component Value Date/Time   NA 139 01/31/2013 0530   K 3.8 01/31/2013 0530   CL 104 01/31/2013 0530    CO2 26 01/31/2013 0530   GLUCOSE 65* 01/31/2013 0530   BUN 20 01/31/2013 0530   CREATININE 0.66 01/31/2013 0530   CREATININE 0.77 05/26/2012 1626   CALCIUM 8.9 01/31/2013 0530   GFRNONAA >90 01/31/2013 0530   GFRAA >90 01/31/2013 0530    CBC    Component Value Date/Time   WBC 17.5* 01/31/2013 0530   RBC 3.79* 01/31/2013 0530   HGB 11.9* 01/31/2013 0530   HCT 35.8* 01/31/2013 0530   PLT 214 01/31/2013 0530   MCV 94.5 01/31/2013 0530   MCH 31.4 01/31/2013 0530   MCHC 33.2 01/31/2013 0530   RDW 14.5 01/31/2013 0530   LYMPHSABS 1.7 01/25/2013 2142   MONOABS 0.7 01/25/2013 2142   EOSABS 0.0 01/25/2013 2142   BASOSABS 0.0 01/25/2013 2142     Micro Results: Recent Results (from the past 240 hour(s))  CULTURE, BLOOD (ROUTINE X 2)     Status: None   Collection Time    01/26/13 12:30 AM      Result Value Range Status   Specimen Description BLOOD ARM LEFT   Final   Special Requests BOTTLES DRAWN AEROBIC AND ANAEROBIC 10CC   Final   Culture  Setup Time 01/26/2013 03:50   Final  Culture     Final   Value: STAPHYLOCOCCUS AUREUS     Note: RIFAMPIN AND GENTAMICIN SHOULD NOT BE USED AS SINGLE DRUGS FOR TREATMENT OF STAPH INFECTIONS.     GROUP B STREP(S.AGALACTIAE)ISOLATED     Note: Gram Stain Report Called to,Read Back By and Verified With: CHRISTY JOHNSON ON 01/26/2013 AT 6:50P BY WILEJ   Report Status 01/29/2013 FINAL   Final   Organism ID, Bacteria STAPHYLOCOCCUS AUREUS   Final   Organism ID, Bacteria GROUP B STREP(S.AGALACTIAE)ISOLATED   Final  CULTURE, BLOOD (ROUTINE X 2)     Status: None   Collection Time    01/26/13 12:45 AM      Result Value Range Status   Specimen Description BLOOD HAND RIGHT   Final   Special Requests BOTTLES DRAWN AEROBIC AND ANAEROBIC 10CC   Final   Culture  Setup Time 01/26/2013 03:50   Final   Culture     Final   Value: STAPHYLOCOCCUS AUREUS     GROUP B STREP(S.AGALACTIAE)ISOLATED     Note: Gram Stain Report Called to,Read Back By and Verified With: CHRISTY JOHNSON ON  01/26/2013 AT 6:50P BY WILEJ   Report Status 01/29/2013 FINAL   Final  CULTURE, BLOOD (ROUTINE X 2)     Status: None   Collection Time    01/27/13  2:55 PM      Result Value Range Status   Specimen Description BLOOD LEFT ARM   Final   Special Requests BOTTLES DRAWN AEROBIC ONLY 10CC   Final   Culture  Setup Time 01/27/2013 22:01   Final   Culture     Final   Value:        BLOOD CULTURE RECEIVED NO GROWTH TO DATE CULTURE WILL BE HELD FOR 5 DAYS BEFORE ISSUING A FINAL NEGATIVE REPORT   Report Status PENDING   Incomplete  CULTURE, BLOOD (ROUTINE X 2)     Status: None   Collection Time    01/27/13  3:05 PM      Result Value Range Status   Specimen Description BLOOD LEFT HAND   Final   Special Requests BOTTLES DRAWN AEROBIC ONLY 10CC   Final   Culture  Setup Time 01/27/2013 22:01   Final   Culture     Final   Value:        BLOOD CULTURE RECEIVED NO GROWTH TO DATE CULTURE WILL BE HELD FOR 5 DAYS BEFORE ISSUING A FINAL NEGATIVE REPORT   Report Status PENDING   Incomplete  CULTURE, BLOOD (ROUTINE X 2)     Status: None   Collection Time    01/29/13  2:10 PM      Result Value Range Status   Specimen Description BLOOD LEFT ARM   Final   Special Requests BOTTLES DRAWN AEROBIC ONLY 4CC   Final   Culture  Setup Time 01/29/2013 20:27   Final   Culture     Final   Value:        BLOOD CULTURE RECEIVED NO GROWTH TO DATE CULTURE WILL BE HELD FOR 5 DAYS BEFORE ISSUING A FINAL NEGATIVE REPORT   Report Status PENDING   Incomplete  CULTURE, BLOOD (ROUTINE X 2)     Status: None   Collection Time    01/29/13  2:20 PM      Result Value Range Status   Specimen Description BLOOD LEFT HAND   Final   Special Requests BOTTLES DRAWN AEROBIC ONLY 6CC   Final   Culture  Setup Time 01/29/2013 21:22   Final   Culture     Final   Value:        BLOOD CULTURE RECEIVED NO GROWTH TO DATE CULTURE WILL BE HELD FOR 5 DAYS BEFORE ISSUING A FINAL NEGATIVE REPORT   Report Status PENDING   Incomplete   Studies/Results: No  results found. Medications: I have reviewed the patient's current medications. Scheduled Meds: . aspirin  81 mg Oral Daily  . atenolol  100 mg Oral Daily  . benazepril  40 mg Oral Daily  .  ceFAZolin (ANCEF) IV  2 g Intravenous Q8H  . clopidogrel  75 mg Oral Daily  . dextrose      . escitalopram  20 mg Oral Daily  . heparin  5,000 Units Subcutaneous Q8H  . hydrochlorothiazide  25 mg Oral Daily  . insulin aspart  0-15 Units Subcutaneous TID WC  . insulin aspart  0-5 Units Subcutaneous QHS  . insulin aspart protamine-insulin aspart  125 Units Subcutaneous Q breakfast  . insulin aspart protamine-insulin aspart  65 Units Subcutaneous Q supper  . ipratropium  0.5 mg Nebulization BID  . levalbuterol  0.63 mg Nebulization BID  . mometasone-formoterol  2 puff Inhalation BID  . nicotine  14 mg Transdermal Q24H  . pantoprazole  80 mg Oral Daily  . predniSONE  60 mg Oral Q breakfast  . pregabalin  100 mg Oral TID  . silver sulfADIAZINE   Topical Daily  . simvastatin  20 mg Oral q1800  . sodium chloride  3 mL Intravenous Q12H   Continuous Infusions:  PRN Meds:.sodium chloride, acetaminophen, acetaminophen, albuterol, benzonatate, ondansetron (ZOFRAN) IV, ondansetron, oxyCODONE, sodium chloride Assessment/Plan: Principal Problem:   Falls Active Problems:   DM W/MANIFESTATION NEC, TYPE II, UNCONTROLLED   HYPERTENSION   COPD   PVD (peripheral vascular disease) with claudication   Right ankle swelling   Diabetic foot ulcers   Bacteremia  1. Staph and GBS bacteremia Patient was de-escalated from vancomycin/ceftriaxone to cefazolin on Sunday, and her latest blood culture on 2/8 has no growth. However, her WBC increased from 10 to 14.4, then 17.5 since Saturday. Although she does not have other symptoms of SIRS (HR>90, RR>20, Temp >38C), she could be at risk for bacteremia given that her WBC increased but she is on antibiotics. Her blood culture showed S. aureus resistant to penicillin, and  GBS that is not resistant to antibiotics tested (vancomycin, penicillin, amoxicillin, etc). Another cause of her WBC increase could be due to soft tissue debridement performed on Sunday. Given the lack of fever, WBC increase due to surgical procedure may be more likely.  -Continue cefazolin, and monitor for any other SIRS signs. Consider changing antibiotics if patient deteriorates. -Patient will need PICC before going home, and need cultures to be negative prior to PICC placement. -F/u with TEE  2. Falls/dizziness The most likely causes are venodilation due to bacteremia or benign paroxysmal positional vertigo. Patient has responded well to PT canalith maneuvers.  -Treat bacteremia with antibiotics  3. Blood in stool Today, patient did not complain of blood in stool, but she reported having tarry-stool on Saturday, and her FOBT is positive yesterday. The description of the stool indicates upper GI bleed, but patient has bad eye sight due to diabetes. Other possible causes could be hemorrhoids. Patient's Hgb has been decreasing since admission (13.2 to 11.9), but because the drop in Hgb is not drastic, it could be due to hemoconcentration resolved by IVF.  -Perform rectal exam  for any anal bleeding, if there's indication of hemorrhoid bleeding, treat hemorrhoids. If there's indication for upper GI bleed, we can recommend upper GI endoscopy. -Hold heparin -Follow CBC  4. Foot ulcers and LE swelling Patient received debridement on Saturday, and symptoms are stable. There is no evidence of DVT. ABIs are within normal, not suggesting any active clots. Patient will have wound consult, and receive post-op shoe fitting to better manage foot ulcers.  -Wound consult on how to take care of wound -Shoe fitting with wound consult  5. COPD  Patient has worsening chronic cough and new yellow/green sputum production. She has a 60 pack years of smoking history. She reported worsening cough, change in sputum  quality, and SOB, which indicates COPD exacerbation. Thus, she was started on oral prednisone.  -Prednisone PO  -levalbuterol, ipratropium nebs  -Mometasone-formoterol inhaler   6. Pulmonary edema  -Patient showed signs of pulmonary edema on CXR, but EF was 60-65% on recent ECHO. However she complained of SOB, her foot are swollen, and her pro-BNP was elevated to the 700s. She does not appear to have heart failure clinically. Thus, we will stop furosemide since she may be bacteremic that could lead to sepsis.  -monitor BNP level   7. Headache  Patient has had HA for several days, unclear if preceded or followed falls. No visual changes or pain on palpation of temporal artery, CT unremarkable. ESR is elevated, but patient also has a bacterial infection at the time of ESR check.  -Monitor headache.   8. Hyperglycemia in setting of T2DM  Patient has been non adherent, and has received diabetic education while in the hospital.  -Home insulin regiemtn with Novolog 125U qAM adn 65U qPM  -SSI  -Follow Bmet and AG   9. HTN  -Continue home regimen of atenolo, HCTZ, and benazepril   10. Peripheral vascular disease  -Continue clopedogrel   11. Depression/mood disorder  -Continue escitalopram   12. Fibromyalgia  -Continue pregabalin   13: Nicotine withdrawal  -nicotine patch 14mg    Proph:  -DVT: stop heparin due to GI bleed -CAD in the setting of T2DM: aspirin, simvastatin  -GI: pantoprazole    LOS: 6 days   This is a Psychologist, occupational Note.  The care of the patient was discussed with Dr. Daiva Eves, Dr. Dierdre Searles, and Dr. Burtis Junes, and the assessment and plan formulated with their assistance.  Please see their attached note for official documentation of the daily encounter.  Rhea Pink 01/31/2013, 7:39 AM

## 2013-01-31 NOTE — Interval H&P Note (Signed)
History and Physical Interval Note:  01/31/2013 1:34 PM  Jennifer [name]  has presented today for surgery, with the diagnosis of Bacteremia, R/o Endocarditis  The various methods of treatment have been discussed with the patient and family. After consideration of risks, benefits and other options for treatment, the patient has consented to  Procedure(s) with comments: TRANSESOPHAGEAL ECHOCARDIOGRAM (TEE) (N/A) - Rm 1O10 as a surgical intervention .  The patient's history has been reviewed, patient examined, no change in status, stable for surgery.  I have reviewed the patient's chart and labs.  Questions were answered to the patient's satisfaction.     Dietrich Pates

## 2013-01-31 NOTE — Progress Notes (Signed)
    [facility]  [facility]  Staphylococcus aureus bacteremia (SAB) is associated with a high rate of complications and mortality.   Specific aspects of clinical management are critical to optimizing the outcome of patients with SAB. Therefore, the [facility] Antimicrobial Management Team ([facility]) have initiated an intervention aimed at improving the management of SAB at [facility]. To do so, Infectious Diseases Consultants are providing evidence-based recommendations for the management of all patients with SAB. The specific recommendations for this patient are marked "X" in this document.   Recommended [x ] Completed [January 27 2013]   Perform follow-up blood cultures (even if the patient is afebrile) to ensure clearance of bacteremia.   Recommended [ ]  Completed [date] Remove vascular catheter, and obtain follow-up cultures after removal of catheter   NA   Recommended Arly.Keller ] Completed [tte ON 01/26/13 AND TEE TODAY]   Perform echocardiography to evaluate for endocarditis (transthoracic ECHO is 40-50% sensitive, TEE is > 90% sensitive).*  Please keep in mind, that neither test can definitively EXCLUDE endocarditis, and that should clinical suspicion remain high for endocarditis the patient should then still be treated with an "endocarditis" duration of therapy = 6 weeks    Recommended [ ]  Completed [date] Consult electrophysiologist to evaluate implanted cardiac device (pacemaker, ICD)   NA   Recommended [ X ] Completed [01/30/13] Ensure source control.  Have all abscesses been drained effectively?  Have deep seeded infections (septic joints or osteomyelitis) had appropriate surgical debridement?   ORTHOPEDICS CONSULTED FORMALLY and perfromed bedside debridement on 01/29/13  Recommended [ X ] Completed [daily] Investigate for "metastatic" sites of infection.  Does the patient have ANY symptom or physical exam finding that would suggest  a deeper infection (back or neck pain that may be suggestive of vertebral osteomyelitis or epidural abscess, muscle pain that could be a symptom of pyomyositis)?  Keep in mind that for deep seeded infections MRI imaging with contrast is preferred rather than other often insensitive tests such as plain x-rays, especially early in a patient's presentation.   See above no other source besides feet identfied so far.   [ X ] Completed [01/30/13] Narrow antibiotic regimen to CEFAZOLIN 2 grams iv q 8 hours which covers BOTH MSSA and GBS   [ x ] Estimated duration of IV Antibiotic therapy:  4-6 WEEKS  DEPENDING ON TEE FINDINGS AND COURSE IN CLOSE FU IN [facility]  [ X ] Consult case management for probable prolonged outpatient intravenous antibiotic therapy.  Will do this over weekend once we have ensured clearnace of bacteremia and source control.   pLACE picc and case management consult placed for home IV abx

## 2013-01-31 NOTE — Progress Notes (Signed)
Occupational Therapy Treatment Patient Details Name: Jennifer [name] MRN: [id number] DOB: 09/09/1961 Today's Date: 01/31/2013 Time: 4098-1191 OT Time Calculation (min): 42 min  OT Assessment / Plan / Recommendation Comments on Treatment Session  (Pt. is Mod I with ADL's using cross leg techniqe for LE.)    Follow Up Recommendations  Home health OT    Barriers to Discharge       Equipment Recommendations  3 in 1 bedside comode    Recommendations for Other Services    Frequency     Plan All goals met and education completed, patient discharged from OT services    Precautions / Restrictions Precautions Precautions: Fall Restrictions Weight Bearing Restrictions: No       ADL  Grooming: Performed;Wash/dry hands Where Assessed - Grooming: Unsupported standing Lower Body Dressing: Performed;Modified independent Where Assessed - Lower Body Dressing: Unsupported sitting;Unsupported standing Toilet Transfer: Performed;Modified independent Toilet Transfer Method: Sit to stand;Stand pivot Toilet Transfer Equipment: Comfort height toilet;Grab bars Toileting - Clothing Manipulation and Hygiene: Performed;Modified independent Where Assessed - Toileting Clothing Manipulation and Hygiene: Standing Equipment Used: Gait belt;Rolling walker Transfers/Ambulation Related to ADLs:  (AMB to bathroom) ADL Comments:  (Pt. states she is indepedent with all ADL's. )    OT Diagnosis:    OT Problem List:   OT Treatment Interventions:     OT Goals ADL Goals Pt Will Perform Grooming: with modified independence ADL Goal: Grooming - Progress: Met Pt Will Perform Lower Body Bathing: with modified independence;Sitting, edge of bed ADL Goal: Lower Body Bathing - Progress: Met Pt Will Perform Lower Body Dressing: with modified independence;Sitting, bed;Unsupported ADL Goal: Lower Body Dressing - Progress: Met Pt Will Transfer to Toilet: with modified independence ADL Goal: Toilet Transfer -  Progress: Met  Visit Information  Last OT Received On: 01/31/13 Assistance Needed: +1    Subjective Data      Prior Functioning       Cognition  Cognition Overall Cognitive Status: Appears within functional limits for tasks assessed/performed    Mobility  Bed Mobility Bed Mobility: Supine to Sit Details for Bed Mobility Assistance:  (Pt. Mod I with bed mobililty.) Transfers Sit to Stand: 6: Modified independent (Device/Increase time);From bed;From chair/3-in-1;From toilet Stand to Sit: 6: Modified independent (Device/Increase time);To bed;To chair/3-in-1;To toilet    Exercises      Balance Dynamic Sitting Balance Dynamic Sitting Balance - Compensations: WFL Static Standing Balance Static Standing - Balance Support: Right upper extremity supported   End of Session OT - End of Session Equipment Utilized During Treatment: Gait belt Activity Tolerance: Patient tolerated treatment well Patient left: in chair Nurse Communication: Mobility status  GO     Izabella Marcantel 01/31/2013, 11:37 AM

## 2013-02-01 ENCOUNTER — Encounter (HOSPITAL_COMMUNITY): Payer: Self-pay | Admitting: Internal Medicine

## 2013-02-01 DIAGNOSIS — I39 Endocarditis and heart valve disorders in diseases classified elsewhere: Secondary | ICD-10-CM

## 2013-02-01 DIAGNOSIS — J449 Chronic obstructive pulmonary disease, unspecified: Secondary | ICD-10-CM

## 2013-02-01 DIAGNOSIS — I339 Acute and subacute endocarditis, unspecified: Secondary | ICD-10-CM

## 2013-02-01 DIAGNOSIS — J4489 Other specified chronic obstructive pulmonary disease: Secondary | ICD-10-CM

## 2013-02-01 DIAGNOSIS — I330 Acute and subacute infective endocarditis: Secondary | ICD-10-CM

## 2013-02-01 DIAGNOSIS — G894 Chronic pain syndrome: Secondary | ICD-10-CM

## 2013-02-01 LAB — CBC
HCT: 35.6 % — ABNORMAL LOW (ref 36.0–46.0)
Hemoglobin: 11.9 g/dL — ABNORMAL LOW (ref 12.0–15.0)
MCH: 31.9 pg (ref 26.0–34.0)
MCHC: 33.4 g/dL (ref 30.0–36.0)
MCV: 95.4 fL (ref 78.0–100.0)
Platelets: 218 10*3/uL (ref 150–400)
RBC: 3.73 MIL/uL — ABNORMAL LOW (ref 3.87–5.11)
RDW: 14.7 % (ref 11.5–15.5)
WBC: 16 10*3/uL — ABNORMAL HIGH (ref 4.0–10.5)

## 2013-02-01 LAB — GLUCOSE, CAPILLARY
Glucose-Capillary: 196 mg/dL — ABNORMAL HIGH (ref 70–99)
Glucose-Capillary: 174 mg/dL — ABNORMAL HIGH (ref 70–99)
Glucose-Capillary: 148 mg/dL — ABNORMAL HIGH (ref 70–99)
Glucose-Capillary: 252 mg/dL — ABNORMAL HIGH (ref 70–99)

## 2013-02-01 LAB — BASIC METABOLIC PANEL
BUN: 22 mg/dL (ref 6–23)
CO2: 28 mEq/L (ref 19–32)
Calcium: 8.8 mg/dL (ref 8.4–10.5)
Chloride: 104 mEq/L (ref 96–112)
Creatinine, Ser: 0.82 mg/dL (ref 0.50–1.10)
GFR calc Af Amer: >90 mL/min (ref >90)
GFR calc non Af Amer: 80 mL/min — ABNORMAL LOW (ref >90)
Glucose, Bld: 185 mg/dL — ABNORMAL HIGH (ref 70–99)
Potassium: 3.8 mEq/L (ref 3.5–5.1)
Sodium: 140 mEq/L (ref 135–145)

## 2013-02-01 MED ORDER — PREDNISONE 10 MG PO TABS: 40.0000 mg | ORAL_TABLET | Freq: Every day | ORAL | Status: DC

## 2013-02-01 MED ORDER — CEFAZOLIN SODIUM-DEXTROSE 2-3 GM-% IV SOLR: 2.0000 g | Freq: Three times a day (TID) | INTRAVENOUS | Status: DC

## 2013-02-01 MED ORDER — HEPARIN SOD (PORK) LOCK FLUSH 100 UNIT/ML IV SOLN
250.0000 [IU] | INTRAVENOUS | Status: AC | PRN
  Administered 2013-02-01: 250 [IU]

## 2013-02-01 NOTE — Progress Notes (Signed)
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 with a course of IV antibiotics  Objective: Vital signs in last 24 hours: Temp:  [97.9 F (36.6 C)-98.1 F (36.7 C)] 98 F (36.7 C) (02/11 1400) Pulse Rate:  [56-78] 56 (02/11 1400) Cardiac Rhythm:  [-] Normal sinus rhythm (02/11 0904) Resp:  [16-20] 20 (02/11 1400) BP: (142-189)/(69-85) 163/70 mmHg (02/11 1400) SpO2:  [93 %-100 %] 93 % (02/11 1400)  Hemodynamic parameters for last 24 hours:  NSR apyrhexic  Intake/Output from previous day:   Intake/Output this shift:  EXAM  No murmurs No teeth Lungs clear  Lab Results:  Recent Labs  01/31/13 0530 02/01/13 0624  WBC 17.5* 16.0*  HGB 11.9* 11.9*  HCT 35.8* 35.6*  PLT 214 218   BMET:  Recent Labs  01/31/13 0530 02/01/13 0624  NA 139 140  K 3.8 3.8  CL 104 104  CO2 26 28  GLUCOSE 65* 185*  BUN 20 22  CREATININE 0.66 0.82  CALCIUM 8.9 8.8    PT/INR: No results found for this basename: LABPROT, INR,  in the last 72 hours ABG    Component Value Date/Time   PHART 7.421 01/26/2013 0018   HCO3 26.7* 01/26/2013 0018   TCO2 28.0 01/26/2013 0018   O2SAT 95.0 01/26/2013 0018   CBG (last 3)   Recent Labs  02/01/13 0725 02/01/13 1117 02/01/13 1702  GLUCAP 174* 148* 252*    Assessment/Plan: S/P Procedure(s) (LRB): TRANSESOPHAGEAL ECHOCARDIOGRAM (TEE) (N/A) DC for home IV Ancef  will follow as outpatient   LOS: 7 days    VAN TRIGT III,Ahmaya Ostermiller 02/01/2013

## 2013-02-01 NOTE — Discharge Instructions (Signed)
Wash bilateral feet with dial soap and water daily. Apply Silvadene plus dry dressing daily to the wounds. Use postoperative shoes for ambulation. Stretch heel cord daily.  Bacteremia Bacteremia occurs when bacteria get in your blood. Normal blood does not usually have bacteria. Bacteremia is one way infections can spread from one part of the body to another. CAUSES   Causes may include anything that allows bacteria to get into the body. Examples are:  Catheters.  Intravenous (IV) access tubes.  Cuts or scrapes of the skin.  Temporary bacteremia may occur during dental procedures, while brushing your teeth, or during a bowel movement. This rarely causes any symptoms or medical problems.  Bacteria may also get in the bloodstream as a complication of a bacterial infection elsewhere. This includes infected wounds and bacterial infections of the:  Lungs (pneumonia).  Kidneys (pyelonephritis).  Intestines (enteritis, colitis).  Organs in the abdomen (appendicitis, cholecystitis, diverticulitis). SYMPTOMS  The body is usually able to clear small numbers of bacteria out of the blood quickly. Brief bacteremia usually does not cause problems.   Problems can occur if the bacteria start to grow in number or spread to other parts of the body. If the bacteria start growing, you may develop:  Chills.  Fever.  Nausea.  Vomiting.  Sweating.  Lightheadedness and low blood pressure.  Pain.  If bacteria start to grow in the linings around the brain, it is called meningitis. This can cause severe headaches, many other problems, and even death.  If bacteria start to grow in a joint, it causes arthritis with painful joints. If bacteria start to grow in a bone, it is called osteomyelitis.  Bacteria from the blood can also cause sores (abscesses) in many organs, such as the muscle, liver, spleen, lungs, brain, and kidneys. DIAGNOSIS   This condition is diagnosed by cultures of the  blood.  Cultures may also be taken from other parts of the body that are thought to be causing the bacteremia. A small piece of tissue, fluid, or other product of the body is sampled. The sample is then put on a growth plate to see if any bacteria grows.  Other lab tests may be done and the results may be abnormal. TREATMENT  Treatment requires a stay in the hospital. You will be given antibiotic medicine through an IV access tube. PREVENTION  People with an increased risk of developing bacteremia or complications may be given antibiotics before certain procedures. Examples are:  A person with a heart murmur or artificial heart valve, before having his or her teeth cleaned.  Before having a surgical or other invasive procedure.  Before having a bowel procedure. Document Released: 09/21/2006 Document Revised: 03/01/2012 Document Reviewed: 07/03/2011 [facility] Patient Information 2013 [place], [place].

## 2013-02-01 NOTE — Progress Notes (Addendum)
Resident Addendum to Medical Student Note   I have seen and examined the patient, and agree with the the medical student assessment and plan outlined above. Please see my brief note below for additional details.  S: Pt doing and feeling better this AM. Very anxious to go home. Tolerated TEE well yesterday.    OBJECTIVE: VS: Reviewed  Meds: Reviewed  Labs: Reviewed  Imaging: Reviewed   Physical Exam: General: Vital signs reviewed and noted. Well-developed, well-nourished, in no acute distress; alert, appropriate and cooperative throughout examination.  HEENT: Normocephalic, atraumatic  Lungs:  Normal respiratory effort. Clear to auscultation BL without crackles or wheezes.  Heart: RRR. S1 and S2 normal without gallop, murmur, or rubs.  Abdomen:  BS normoactive. Soft, Nondistended, non-tender.  No masses or organomegaly.  Extremities: No pretibial edema. Right and left feet in gauze c/d/i, no purulent drainage     ASSESSMENT/ PLAN: Pt is a 52 y.o. yo female with a PMHx of severly poorly controlled DM who was admitted on 01/25/2013 with symptoms of dizziness, which was determined to be secondary to bacteremia.   1. 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 TEE on 01/31/13 with small vegetations on mitral and tricuspid valve along with chordae tendinae rupture.  -consult to cardiothoracic and will formally see pt today to evaluate -PICC placed for 6 wks planned Ancef Abx -care management consult for home health nursing  2. ?Dark stools: this has not been confirmed by nursing and pt has retracted these findings. Saying not really dark but maybe. Pt denied any  Homero Fellers BRBPR and H/H have been stable since admission. FOBT was positive on 01/30/13. Rectal exam was refused by the patient. Pt has had both EGD and colonoscopy that showed some diverticula, polyps, and esophageal stricture that was dilated in  4/12.  -cont to monitor H/H -outpt follow up  3. Diabetic foot ulcers: pt recovering from debridement well and will f/u with [facility] as outpt.  Length of Stay: 7  Christen Bame 02/01/2013, 9:57 AM   Internal Medicine Teaching Service Attending Note Date: 02/01/2013  Patient name: Jennifer [name]  Medical record number: [id number]  Date of birth: [birth year]    This patient has been seen and discussed with the house staff. Please see their note for complete details. I concur with their findings with the following additions/corrections:  Patients feet are stable after debridement on the left. I explained to her need for longterm abx and fu with me in ID clinic  I also explained need for CVTS consult  With re to COPD, I would start tapering her steroids earlier given that we are treating active infection.   Would drop to 40mg  tomorrow.  Acey Lav 02/01/2013, 10:39 AM

## 2013-02-01 NOTE — Progress Notes (Signed)
Peripherally Inserted Central Catheter/Midline Placement  The IV Nurse has discussed with the patient and/or persons authorized to consent for the patient, the purpose of this procedure and the potential benefits and risks involved with this procedure.  The benefits include less needle sticks, lab draws from the catheter and patient may be discharged home with the catheter.  Risks include, but not limited to, infection, bleeding, blood clot (thrombus formation), and puncture of an artery; nerve damage and irregular heat beat.  Alternatives to this procedure were also discussed.  Consent obtained by Stacie Glaze, RN  PICC/Midline Placement Documentation  PICC / Midline Single Lumen 02/01/13 PICC Right Basilic (Active)       Jennifer [name], Jennifer [name] 02/01/2013, 3:41 PM

## 2013-02-01 NOTE — Discharge Summary (Signed)
Physician Discharge Summary  Jennifer [name] ID: Jennifer [name] MRN: [id number] DOB/AGE: 1961-01-23 52 y.o.  Admit date: 01/25/2013 Discharge date: 02/01/2013 Attending physician: Dr. Paulette Blanch [place]  Admission Diagnoses:  Benign Paroxysmal Positional Vertigo Type 2 Diabetes R foot ulcer due to diabetes Peripheral neuropathy PVD HTN COPD Pulmonary edema Asthma GERD Fibromyalgia Diverticula of colon Depression Chronic kidney disease Glaucoma  Discharge Diagnoses:  Staph aureus and GBS bacteremia Endocarditis of tricuspid and mitral valves Benign Paroxysmal Positional Vertigo Type 2 Diabetes R foot ulcer due to diabetes Peripheral neuropathy PVD HTN COPD Asthma GERD Fibromyalgia Diverticula of colon Depression Chronic kidney disease Glaucoma  Admission HPI: Jennifer [name] is a 52 yo lady with pmh of T2DM complicated by foot ulcers and neuropathy, HTN, PVD s/p stenting, COPD, CKD and fibromyalgia presenting with chief complaint of falls since 01/21/13.  Jennifer [name] reports that she was in Jennifer [name] usual state of health until last Friday, when she began falling in Jennifer [name] home. She says that she has fallen several times a day since Friday, and has lost count. Falls occur when she stands from a seated position. She reports feeling primarily unsteady on Jennifer [name] legs and weak. She has fallen onto Jennifer [name] head and side, denies sustaining any cuts or bruises. She reports a sensation of dizziness preceding falls. She alternately reports falling with complete awareness of the episodes as well as falls where she "blacks out" and awakens on the floor. No sensation of room spinning. No palpitations. No symptoms at rest. Symptoms are not provoked by rotational movements of Jennifer [name] head. She feels generally weaker but denies focal weakness/numbness. No blurry vision, clouded vision, or monocular vision loss. Has history of tinnitus which she says is somewhat improved than in the past. At home she ambulates with walker at  baseline due to DM peripheral neuropathy.  Reports concomitant frontal headache which extends to bilateral temples. Cannot remember if HA preceded falls or not.  One episode of nausea and nonbloodly, nonbilious emesis.  She says that on Friday, Jennifer [name] son told Jennifer [name] that Jennifer [name] right foot looked "red and swollen." Jennifer [name] has chronic diabetic ulcers for which she follows at wound care. She says that Jennifer [name] R ankle is now more erythematous and edematous than usual. Both feet are painful to ambulate on.  Over the past few days, has noticed worsening of Jennifer [name] baseline chronic cough as well as SOB. Now with new yellow/green phlegm. Some chills.  Denies chest pain, chest pressure, photophobia, dysuria, hematuria, abdominal pain, diarrhea, constipation, melena, hematochezia.   Admission Physical Exam:  Blood pressure 128/69, pulse 111, temperature 99.7 F (37.6 C), temperature source Oral, resp. rate 8, SpO2 95.00%.  Vitals reviewed.  General: Obese female resting in bed, some increased WOB but otherwise not in distress  HEENT: Dry MM of OP with dry, brown tongue. PERRL, EOMI, no scleral icterus. TM pearly bilaterally. Scalp even with no lacerations or swelling. No tenderness to palpation of R or L temporal scalp  Neck: No nuchal rigidity  Cardiac: Tachycardic 100-110 with regular rate, no rubs, murmurs or gallops  Pulm: slightly increased WOB but good air movement bilateral lung fields, bilateral soft end-expiratory wheezes  Abd: Induration of subcutaneous tissues of bilateral lower quadrants at site of insulin injection, non erythematous and no ecchymoses visualized; otherwise abdomen benignnontender, nondistended, normoactive BS  Ext: R foot and ankle with 2+ pitting edema and erythema. 6x6 cm wound on medial R plantar surface with thick layer of pale, insensate skin and no active drainage. L foot  with partial thickness 4x4cm ulcer on medial plantar surface. R leg with 1+ pitting edema to knee. Bilateral xerosis  and hyperpigmentation of lower extremities. 2+ DP pulses bilaterally. No pain with dorsiflexion of R foot or calf squeeze.  Neuro: alert and oriented X3, cranial nerves II-XII grossly intact, strength 5/5 bilateral UE, LE and face. Diminished sensation to touch bilateral lower extremities  Gait not assessed due to foot pain  Consultation:  PT consultation:  Wound care: Melody Eliberto Ivory RN  Orthopedics:  Cardiac Thoracic Surgery Donata Clay MD  Hospital Course:  1. Staph aureus and GBS bacteremia Blood culture from bilateral arms showed staph aureus resistant to penicillinand GBS growth. It is noted that Jennifer [name] had elevated WBC of 14.7, HR of 94, but never had fevers. Jennifer [name] was empirically treated with vancomycin and ceftriaxone until sensitivities returned. Jennifer [name] was then placed on cefazolin. Jennifer [name] blood culture was negative on 2/8; thus, PICC was placed prior to discharge for IV cefazolin. TEE on 2/10 showed vegetation on posterior mitral leaflet and atrial surface of tricuspid valve. Ruptured tertiary chordae tendinae could be ruled out with this imaging. Cardiac/thoracic surgery was consulted, and their recommendations were to continue outpatient antibiotics and follow up with them in 3 weeks.   2. Falls/dizziness Jennifer [name] had negative CT head and cervical spine scans, and negative carotid doppler findings on admission. Blood cultures were obtained for the Jennifer [name] due to concern for orthostatic hypotension even though Jennifer [name]'s orthostatic signs were normal. Jennifer [name] had staph and GBS bacteremia, which was treated as above. Jennifer [name] also received canalith procedures from PT, which resolved Jennifer [name] symptoms.   3. Foot ulcers and LE swelling MRI of Jennifer [name]'s foot were performed, but no abscesses or active osteomyelitis was found. Wound care recommended consulting orthopedics for debridement of right foot ulcer, and orthopedics were able to remove skin and soft tissue from R foot ulcer. Jennifer [name] was also  given instructions for heel cord stretching, and daily wound care. Jennifer [name] will follow up with orthopedics after discharge.   As for LE swelling, Jennifer [name] received LE doppler, which was negative for DVT. She also received ABI, which were normal. She does not have active claudications.   4. Blood in stool On 2/8, Jennifer [name] reported dark stools, and FOBT on 2/9 was positive for blood in stool. Upon further questioning, Jennifer [name] denied seeing blood in stool, and mild left abdominal sourness. Jennifer [name] declined rectal exam. Jennifer [name] Hgb level does not indicate active blood loss. She came in with Hgb of 13.6 that rapidly decreased to 12.3 after IVF. Then Jennifer [name] Hgb remained stable ranging from 11.9 to 12.3. Thus, we are not concerned about GI blood loss at this time.  5. COPD Jennifer [name] presented COPD exacerbation, and we started 3L oxygen and oral prednisone while in hospital. She also took levalbuterol, ipratropium, and mometasone-formoterol inhaler. Jennifer [name] stopped oxygen on 2/7, and Jennifer [name] symptoms were controlled by discharge.  6. Pulmonary edema Jennifer [name] showed signs of pulmonary edema on CXR, but Jennifer [name] EF was 60-65% on ECHO. Although Jennifer [name] pro-BNP was elevated to the 700s, and she complained of SOB and swelling in feet, those symptoms could be due to Jennifer [name] COPD exacerbation. She was initially given furosemide, but since she was discovered to be bacteremic, and was responding to COPD treatments, we stopped furosemide.  7. Hyperglycemia in the setting of T2DM Jennifer [name] has not been adherent to diabetic treatments, and received diabetic education while in the hospital. Jennifer [name] was given home insulin regimen of Novolog 125U qAM and 65U qPM, and SSI  while in hospital. Jennifer [name] blood sugar decreased from 396 to the upper 190's and low 200's.   8. Other For HTN, PVD, depression/mood disorder, and fibromyalgia, Jennifer [name] continued on home medications while in the hospital.     Procedures performed:   1. MRI of left and right foot  (01/27/2013):  -R foot:  Findings: The right forefoot exam quality is significantly better than that of the opposite foot. There is no significant motion artifact. Examination includes most of the forefoot and midfoot.  There is diffuse some dorsal subcutaneous edema. There is edema and enhancement throughout the flexor musculature. The muscles are diffusely atrophied on T1-weighted images. No focal fluid collections are identified.  Again demonstrated is a well-circumscribed 3.5 cm lipoma plantar to the first and second metatarsals. This appears unchanged. There is no associated osseous erosion.  The distal fourth phalanx appears chronically eroded, not grossly changed from the radiographs of 7 months prior. No acute bone destruction or suspicious marrow enhancement is identified to suggest acute osteomyelitis.  There is diffuse nodularity of the plantar fascia within the midfoot and forefoot which is similar to the prior examination and suspicious for plantar fibromatosis.  IMPRESSION:  1. No evidence of right forefoot soft tissue abscess or acute osteomyelitis. 2. Stable erosion of the distal fourth phalanx from radiographs of 07/09/2012. This may indicate chronic osteomyelitis. 3. Stable chronic myopathic changes and lipoma within the forefoot. 4. Stable nodularity of the plantar fascia suspicious for plantar fibromatosis.  -L foot: Findings: Study is moderately motion degraded. A variably sized field of view includes most of the forefoot and midfoot. The distal toes and hind foot are incompletely visualized.  There is mild dorsal subcutaneous edema. There is edema and enhancement throughout the flexor musculature of the forefoot. No focal fluid collection is identified. There appears to be focal soft tissue enhancement within the plantar aspect of the second webspace. This area is not well visualized on T2-weighted images.  There is irregular nodular thickening of the  medial cord of the plantar fascia within the forefoot. No discontinuity is identified. On the sagittal images, the medial cord is mildly thickened adjacent to a plantar calcaneal spur.  There is no evidence of bone destruction or suspicious enhancement to suggest osteomyelitis. Mild degenerative changes are present at the Lisfranc joint.  IMPRESSION:  1. No evidence of soft tissue abscess or osteomyelitis. 2. Nonspecific forefoot muscular edema and enhancement could represent myositis or diabetic myopathy. 3. Focal enhancement within the plantar aspect of the second webspace. This could indicate a Morton's neuroma. 4. Nodular thickening of the medial cord of the plantar fascia is suspicious for plantar fibromatosis.   2. TTE (01/26/13): Study Conclusions  - Left ventricle: The cavity size was normal. Wall thickness was normal. Systolic function was normal. The estimated ejection fraction was in the range of 60% to 65%. Wall motion was normal; there were no regional wall motion abnormalities. Features are consistent with a pseudonormal left ventricular filling pattern, with concomitant abnormal relaxation and increased filling pressure (grade 2 diastolic dysfunction). - Left atrium: The atrium was mildly dilated. Transthoracic echocardiography. M-mode, complete 2D, spectral Doppler, and color Doppler. Height: Height: 170.2cm. Height: 67in. Weight: Weight: 119.5kg. Weight: 263lb. Body mass index: BMI: 41.3kg/m^2. Body surface area: BSA: 2.46m^2. Blood pressure: 142/74. Jennifer [name] status: Inpatient. Location: Echo laboratory.  3. TEE (01/31/13): Study Conclusions  - Mitral valve: Small mobile echodensity that is probably attached to posterior mitral leafet. Attachment point not seen. Prolapses into LA. Consistent with small  vegetation. Cannot completely exclude ruptured tertiary chordae tendinae. Mild MR. (best seen in image 31) - Tricuspid valve: Small mobile echodensity along  atrial surface of tricuspid valve. Seen only in 4 chamber view (image 1). Consistent with small vegetation. Trace TR. Transesophageal echocardiography. 2D and color Doppler. Height: Height: 170.2cm. Height: 67in. Weight: Weight: 122.3kg. Weight: 269lb. Body mass index: BMI: 42.2kg/m^2. Body surface area: BSA: 2.56m^2. Blood pressure: 169/75. Jennifer [name] status: Inpatient. Location: Endoscopy.   Discharge Exam: Blood pressure 142/72, pulse 78, temperature 97.9 F (36.6 C), temperature source Oral, resp. rate 20, height 5\' 7"  (1.702 m), weight 122.018 kg (269 lb), SpO2 100.00%. GEN: Alert, not in acute distress, obese female, lying in bed with legs elevated  HEENT: PERRLA, EOMI, MMM, supple neck, left eye shows mild exophthalmos  CV: RRR, no murmurs, no gallops, no rubs  PULM: no WOB, not in acute respiratory distress, diminished lung sounds, symmetrical bilateral lung expansion  ABDO: NDNT, stomach is too obese to feel for any masses  NEURO: A&Ox3, CNII-XII intact, numbness in LE bilaterally  MSK: 4/5 strength in R leg from hip down, 5/5 in L leg, extreme pain in R leg from hip down on contact, no change in warmth or swelling R leg from admission  Extremities: 1+ pedal edema bilaterally, <2s capillary refill on fingernail beds   Discharge Day Labs: BMET    Component Value Date/Time   NA 140 02/01/2013 0624   K 3.8 02/01/2013 0624   CL 104 02/01/2013 0624   CO2 28 02/01/2013 0624   GLUCOSE 185* 02/01/2013 0624   BUN 22 02/01/2013 0624   CREATININE 0.82 02/01/2013 0624   CREATININE 0.77 05/26/2012 1626   CALCIUM 8.8 02/01/2013 0624   GFRNONAA 80* 02/01/2013 0624   GFRAA >90 02/01/2013 0624    CBC    Component Value Date/Time   WBC 16.0* 02/01/2013 0624   RBC 3.73* 02/01/2013 0624   HGB 11.9* 02/01/2013 0624   HCT 35.6* 02/01/2013 0624   PLT 218 02/01/2013 0624   MCV 95.4 02/01/2013 0624   MCH 31.9 02/01/2013 0624   MCHC 33.4 02/01/2013 0624   RDW 14.7 02/01/2013 0624   LYMPHSABS 1.7 01/25/2013  2142   MONOABS 0.7 01/25/2013 2142   EOSABS 0.0 01/25/2013 2142   BASOSABS 0.0 01/25/2013 2142     Disposition: 01-Home or Self Care   Future Appointments Provider Department Dept Phone   03/01/2013 11:15 AM Burns Spain, MD [facility] [phone]       Medication List    ASK your doctor about these medications       ADVAIR DISKUS 250-50 MCG/DOSE Aepb  Generic drug:  Fluticasone-Salmeterol  INHALE 1 PUFF INTO THE LUNG TWICE A DAY     albuterol 108 (90 BASE) MCG/ACT inhaler  Commonly known as:  PROVENTIL HFA;VENTOLIN HFA  Inhale 2 puffs into the lungs every 6 (six) hours as needed. For breathing     aspirin 81 MG tablet  Take 81 mg by mouth daily.     atenolol 100 MG tablet  Commonly known as:  TENORMIN  TAKE 1 TABLET (100 MG TOTAL) BY MOUTH DAILY.     baclofen 10 MG tablet  Commonly known as:  LIORESAL  TAKE 1 TABLET THREE TIMES A DAY     benazepril 40 MG tablet  Commonly known as:  LOTENSIN  TAKE 1 TABLET BY MOUTH EVERY DAY     clopidogrel 75 MG tablet  Commonly known as:  PLAVIX  Take  1 tablet (75 mg total) by mouth daily.     diclofenac sodium 1 % Gel  Commonly known as:  VOLTAREN  Apply 2 g topically 3 (three) times daily. To abdomen     DULoxetine 30 MG capsule  Commonly known as:  CYMBALTA  Take 1 capsule (30 mg total) by mouth daily.     escitalopram 20 MG tablet  Commonly known as:  LEXAPRO  Take 1 tablet (20 mg total) by mouth daily.     fluticasone 50 MCG/ACT nasal spray  Commonly known as:  FLONASE  Place 1 spray into the nose 2 (two) times daily.     hydrochlorothiazide 25 MG tablet  Commonly known as:  HYDRODIURIL  TAKE 1 TABLET (25 MG TOTAL) BY MOUTH DAILY.     insulin lispro protamine-insulin lispro (75-25) 100 UNIT/ML Susp  Commonly known as:  HUMALOG 75/25  Inject 65-125 Units into the skin 2 (two) times daily with a meal. Use 125 units every morning and use 65 units every evening     LYRICA 100 MG capsule   Generic drug:  pregabalin  TAKE 2 CAPSULES BY MOUTH 3 TIMES DAILY     metFORMIN 1000 MG tablet  Commonly known as:  GLUCOPHAGE  Take 1 tablet (1,000 mg total) by mouth 2 (two) times daily with a meal.     NEXIUM 20 MG capsule  Generic drug:  esomeprazole  TAKE 1 CAPSULE (20 MG TOTAL) BY MOUTH DAILY.     pravastatin 40 MG tablet  Commonly known as:  PRAVACHOL  Take 1 tablet (40 mg total) by mouth every evening.           Follow-up Information   Follow up with DUDA,MARCUS V, MD In 2 weeks. (May follow up with either myself or the wound center in 2 weeks.)    Contact information:   [address] [place] [address] [phone]       Signed: Rhea Pink 02/01/2013, 10:43 AM      [facility]  Internal Medicine Teaching Service Attending Note Date: 02/02/2013  Jennifer [name] name: RAIZY AUZENNE  Medical record number: [id number]  Date of birth: Sep 16, 1961    This Jennifer [name] has been seen and discussed with the house staff. Please see their note for complete details. I concur with their findings with the following additions/corrections: This Jennifer [name] has been seen and discussed with the house staff. Please see their note for complete details. I concur with their findings with the following additions/corrections:  Complicated Jennifer [name] with Group B streptococcal and MSSA bacteremia and mitral and tricuspid valve endocarditis with apparent source beign diabetic foot infection, sp debridment by Dr .Lajoyce Corners  She has been seen by Cardiology and CVTS surgery.   We will plan on minimum of 6 weeks of IV ancef 2 grams IV q eight hours with close fu with me in [facility] and with Dr. Lajoyce Corners and Dr. Donata Clay. Acey Lav 02/02/2013, 1:27 PM

## 2013-02-01 NOTE — Progress Notes (Signed)
Specific aspects of clinical management are critical to optimizing the outcome of patients with SAB. Therefore, the [facility] Antimicrobial Management Team ([facility]) have initiated an intervention aimed at improving the management of SAB at [facility]. To do so, Infectious Diseases Consultants are providing evidence-based recommendations for the management of all patients with SAB. The specific recommendations for this patient are marked "X" in this document.   Recommended [x ] Completed [January 27 2013]  Perform follow-up blood cultures (even if the patient is afebrile) to ensure clearance of bacteremia.  Recommended [ ]  Completed [date] Remove vascular catheter, and obtain follow-up cultures after removal of catheter   NA   Recommended Arly.Keller ] Completed [tte ON 01/26/13 AND TEE on 01/31/13)]  Perform echocardiography to evaluate for endocarditis (transthoracic ECHO is 40-50% sensitive, TEE is > 90% sensitive).*  Please keep in mind, that neither test can definitively EXCLUDE endocarditis, and that should clinical suspicion remain high for endocarditis the patient should then still be treated with an "endocarditis" duration of therapy = 6 weeks   6 weeks IV ancef  Recommended [ ]  Completed [date] Consult electrophysiologist to evaluate implanted cardiac device (pacemaker, ICD)   NA   Recommended [ X ] Completed [01/30/13] Ensure source control.  Have all abscesses been drained effectively?   Have deep seeded infections (septic joints or osteomyelitis) had appropriate surgical debridement?   ORTHOPEDICS CONSULTED FORMALLY and perfromed bedside debridement on 01/29/13   Recommended [ X ] Completed [daily] Investigate for "metastatic" sites of infection.  Does the patient have ANY symptom or physical exam finding that would suggest a deeper infection (back or neck pain that may be suggestive of vertebral osteomyelitis or epidural abscess, muscle pain that could be a symptom of pyomyositis)?   Keep  in mind that for deep seeded infections MRI imaging with contrast is preferred rather than other often insensitive tests such as plain x-rays, especially early in a patient's presentation.   Endocarditis has been identified, CT surgery consulted  [ X ] Completed [01/30/13] Narrow antibiotic regimen to CEFAZOLIN 2 grams iv q 8 hours which covers BOTH MSSA and GBS   [ x ] Estimated duration of IV Antibiotic therapy:  6 WEEKS  [ X ] Consult case management for probable prolonged outpatient intravenous antibiotic therapy.  Will do this over weekend once we have ensured clearnace of bacteremia and source control.   pLACE picc and case management consult placed for home IV abx

## 2013-02-01 NOTE — Progress Notes (Signed)
D/c orders received;VI removed with gauze on, pt remains in stable condition, pt meds and instructions reviewed and given to pt; called on call CM to schedule outpt PT hydrotherapy per Dr.Van Tright request, pt made aware; pt d/c to home with PICC line

## 2013-02-01 NOTE — Progress Notes (Signed)
PT Cancellation Note  Patient Details Name: Jennifer [name] MRN: [id number] DOB: 1961/06/18   Cancelled Treatment:    Reason Eval/Treat Not Completed: Patient at procedure or test/unavailable (pt. observed up ad lib earlier, PICC line in PM)   Rada Hay 02/01/2013, 3:27 PM

## 2013-02-01 NOTE — Progress Notes (Signed)
Ambulated pt in hallway 145ft, pts o2 sats remained 97% and above, no c/o SOB or CP

## 2013-02-01 NOTE — Progress Notes (Signed)
Medical Student Daily Progress Note  Subjective: Patient denied blood in stool this AM. She does not complain of any other pain. Cough is still present, but better since admission. She does not have shortness of breath. Nursing staff does not report seeing or have received reports about any dark and tarry-stool.  Objective: Vital signs in last 24 hours: Filed Vitals:   01/31/13 1715 01/31/13 2016 01/31/13 2034 02/01/13 0414  BP: 169/81 189/69  187/85  Pulse:  64  69  Temp:  98.1 F (36.7 C)  97.9 F (36.6 C)  TempSrc:  Oral  Oral  Resp: 13 16  20   Height:      Weight:      SpO2: 96% 95% 94% 97%   Weight change:  No intake or output data in the 24 hours ending 02/01/13 0831 Physical Exam: BP 187/85  Pulse 69  Temp(Src) 97.9 F (36.6 C) (Oral)  Resp 20  Ht 5\' 7"  (1.702 m)  Wt 122.018 kg (269 lb)  BMI 42.12 kg/m2  SpO2 97% GEN: Alert, not in acute distress, obese female, lying in bed with legs elevated  HEENT: PERRLA, EOMI, MMM, supple neck, left eye shows mild exophthalmos  CV: RRR, no murmurs, no gallops, no rubs  PULM: no WOB, not in acute respiratory distress, diminished lung sounds, symmetrical bilateral lung expansion  ABDO: NDNT, stomach is too obese to feel for any masses NEURO: A&Ox3, CNII-XII intact, numbness in LE bilaterally  MSK: 4/5 strength in R leg from hip down, 5/5 in L leg, extreme pain in R leg from hip down on contact, no change in warmth or swelling R leg from admission  Extremities: 1+ pedal edema bilaterally, <2s capillary refill on fingernail beds   Lab Results: BMET    Component Value Date/Time   NA 140 02/01/2013 0624   K 3.8 02/01/2013 0624   CL 104 02/01/2013 0624   CO2 28 02/01/2013 0624   GLUCOSE 185* 02/01/2013 0624   BUN 22 02/01/2013 0624   CREATININE 0.82 02/01/2013 0624   CREATININE 0.77 05/26/2012 1626   CALCIUM 8.8 02/01/2013 0624   GFRNONAA 80* 02/01/2013 0624   GFRAA >90 02/01/2013 0624    CBC    Component Value Date/Time   WBC  16.0* 02/01/2013 0624   RBC 3.73* 02/01/2013 0624   HGB 11.9* 02/01/2013 0624   HCT 35.6* 02/01/2013 0624   PLT 218 02/01/2013 0624   MCV 95.4 02/01/2013 0624   MCH 31.9 02/01/2013 0624   MCHC 33.4 02/01/2013 0624   RDW 14.7 02/01/2013 0624   LYMPHSABS 1.7 01/25/2013 2142   MONOABS 0.7 01/25/2013 2142   EOSABS 0.0 01/25/2013 2142   BASOSABS 0.0 01/25/2013 2142     Micro Results: Recent Results (from the past 240 hour(s))  CULTURE, BLOOD (ROUTINE X 2)     Status: None   Collection Time    01/26/13 12:30 AM      Result Value Range Status   Specimen Description BLOOD ARM LEFT   Final   Special Requests BOTTLES DRAWN AEROBIC AND ANAEROBIC 10CC   Final   Culture  Setup Time 01/26/2013 03:50   Final   Culture     Final   Value: STAPHYLOCOCCUS AUREUS     Note: RIFAMPIN AND GENTAMICIN SHOULD NOT BE USED AS SINGLE DRUGS FOR TREATMENT OF STAPH INFECTIONS.     GROUP B STREP(S.AGALACTIAE)ISOLATED     Note: Gram Stain Report Called to,Read Back By and Verified With: CHRISTY JOHNSON ON 01/26/2013 AT  6:50P BY WILEJ   Report Status 01/29/2013 FINAL   Final   Organism ID, Bacteria STAPHYLOCOCCUS AUREUS   Final   Organism ID, Bacteria GROUP B STREP(S.AGALACTIAE)ISOLATED   Final  CULTURE, BLOOD (ROUTINE X 2)     Status: None   Collection Time    01/26/13 12:45 AM      Result Value Range Status   Specimen Description BLOOD HAND RIGHT   Final   Special Requests BOTTLES DRAWN AEROBIC AND ANAEROBIC 10CC   Final   Culture  Setup Time 01/26/2013 03:50   Final   Culture     Final   Value: STAPHYLOCOCCUS AUREUS     GROUP B STREP(S.AGALACTIAE)ISOLATED     Note: Gram Stain Report Called to,Read Back By and Verified With: CHRISTY JOHNSON ON 01/26/2013 AT 6:50P BY WILEJ   Report Status 01/29/2013 FINAL   Final  CULTURE, BLOOD (ROUTINE X 2)     Status: None   Collection Time    01/27/13  2:55 PM      Result Value Range Status   Specimen Description BLOOD LEFT ARM   Final   Special Requests BOTTLES DRAWN AEROBIC ONLY  10CC   Final   Culture  Setup Time 01/27/2013 22:01   Final   Culture     Final   Value:        BLOOD CULTURE RECEIVED NO GROWTH TO DATE CULTURE WILL BE HELD FOR 5 DAYS BEFORE ISSUING A FINAL NEGATIVE REPORT   Report Status PENDING   Incomplete  CULTURE, BLOOD (ROUTINE X 2)     Status: None   Collection Time    01/27/13  3:05 PM      Result Value Range Status   Specimen Description BLOOD LEFT HAND   Final   Special Requests BOTTLES DRAWN AEROBIC ONLY 10CC   Final   Culture  Setup Time 01/27/2013 22:01   Final   Culture     Final   Value:        BLOOD CULTURE RECEIVED NO GROWTH TO DATE CULTURE WILL BE HELD FOR 5 DAYS BEFORE ISSUING A FINAL NEGATIVE REPORT   Report Status PENDING   Incomplete  CULTURE, BLOOD (ROUTINE X 2)     Status: None   Collection Time    01/29/13  2:10 PM      Result Value Range Status   Specimen Description BLOOD LEFT ARM   Final   Special Requests BOTTLES DRAWN AEROBIC ONLY 4CC   Final   Culture  Setup Time 01/29/2013 20:27   Final   Culture     Final   Value:        BLOOD CULTURE RECEIVED NO GROWTH TO DATE CULTURE WILL BE HELD FOR 5 DAYS BEFORE ISSUING A FINAL NEGATIVE REPORT   Report Status PENDING   Incomplete  CULTURE, BLOOD (ROUTINE X 2)     Status: None   Collection Time    01/29/13  2:20 PM      Result Value Range Status   Specimen Description BLOOD LEFT HAND   Final   Special Requests BOTTLES DRAWN AEROBIC ONLY 6CC   Final   Culture  Setup Time 01/29/2013 21:22   Final   Culture     Final   Value:        BLOOD CULTURE RECEIVED NO GROWTH TO DATE CULTURE WILL BE HELD FOR 5 DAYS BEFORE ISSUING A FINAL NEGATIVE REPORT   Report Status PENDING   Incomplete  CULTURE, BLOOD (ROUTINE X  2)     Status: None   Collection Time    01/30/13  3:20 PM      Result Value Range Status   Specimen Description BLOOD RIGHT HAND   Final   Special Requests BOTTLES DRAWN AEROBIC AND ANAEROBIC 10CC   Final   Culture  Setup Time 01/30/2013 23:16   Final   Culture     Final    Value:        BLOOD CULTURE RECEIVED NO GROWTH TO DATE CULTURE WILL BE HELD FOR 5 DAYS BEFORE ISSUING A FINAL NEGATIVE REPORT   Report Status PENDING   Incomplete  CULTURE, BLOOD (ROUTINE X 2)     Status: None   Collection Time    01/30/13  3:28 PM      Result Value Range Status   Specimen Description BLOOD LEFT HAND   Final   Special Requests BOTTLES DRAWN AEROBIC ONLY 10CC   Final   Culture  Setup Time 01/30/2013 23:16   Final   Culture     Final   Value:        BLOOD CULTURE RECEIVED NO GROWTH TO DATE CULTURE WILL BE HELD FOR 5 DAYS BEFORE ISSUING A FINAL NEGATIVE REPORT   Report Status PENDING   Incomplete   Studies/Results: TEE 2/10: -Study Conclusions:  - Mitral valve: Small mobile echodensity that is probably attached to posterior mitral leafet. Attachment point not seen. Prolapses into LA. Consistent with small vegetation. Cannot completely exclude ruptured tertiary chordae tendinae. Mild MR. (best seen in image 31) - Tricuspid valve: Small mobile echodensity along atrial surface of tricuspid valve. Seen only in 4 chamber view (image 1). Consistent with small vegetation. Trace TR. Transesophageal echocardiography. 2D and color Doppler. Height: Height: 170.2cm. Height: 67in. Weight: Weight: 122.3kg. Weight: 269lb. Body mass index: BMI: 42.2kg/m^2. Body surface area: BSA: 2.23m^2. Blood pressure: 169/75. Patient status: Inpatient. Location: Endoscopy.   Medications: I have reviewed the patient's current medications. Scheduled Meds: . aspirin  81 mg Oral Daily  . atenolol  100 mg Oral Daily  . benazepril  40 mg Oral Daily  .  ceFAZolin (ANCEF) IV  2 g Intravenous Q8H  . clopidogrel  75 mg Oral Daily  . escitalopram  20 mg Oral Daily  . heparin  5,000 Units Subcutaneous Q8H  . hydrochlorothiazide  25 mg Oral Daily  . insulin aspart  0-15 Units Subcutaneous TID WC  . insulin aspart  0-5 Units Subcutaneous QHS  . insulin aspart protamine-insulin aspart  125 Units  Subcutaneous Q breakfast  . insulin aspart protamine-insulin aspart  65 Units Subcutaneous Q supper  . ipratropium  0.5 mg Nebulization BID  . levalbuterol  0.63 mg Nebulization BID  . mometasone-formoterol  2 puff Inhalation BID  . nicotine  14 mg Transdermal Q24H  . pantoprazole  80 mg Oral Daily  . predniSONE  60 mg Oral Q breakfast  . pregabalin  100 mg Oral TID  . silver sulfADIAZINE   Topical Daily  . simvastatin  20 mg Oral q1800  . sodium chloride  3 mL Intravenous Q12H   Continuous Infusions:  PRN Meds:.sodium chloride, acetaminophen, acetaminophen, albuterol, benzonatate, ondansetron (ZOFRAN) IV, ondansetron, oxyCODONE, sodium chloride Assessment/Plan: Principal Problem:   Falls Active Problems:   DM W/MANIFESTATION NEC, TYPE II, UNCONTROLLED   HYPERTENSION   COPD   PVD (peripheral vascular disease) with claudication   Right ankle swelling   Diabetic foot ulcers   Bacteremia  1. Staph and GBS bacteremia TEE on  2/10 showed small vegetations on mitral and tricuspid valves and chordae tendinae rupture that cannot be ruled out. Although we will continue with our plan of antibiotics for 6 weeks, we will also consult cardiac-thoracic surgery before patient's discharge.  Patient was de-escalated from vancomycin/ceftriaxone to cefazolin on Sunday 2/9, and her latest blood culture on 2/8 has no growth. Patient's WBC remains elevated since her foot debridement, but she does not have any other symptoms of SIRS, which would be HR>90, RR>20, Temp >38C. She is at low risk for SIRS because she is on IV cefazolin, and her blood cultures showed S. aureus resistant to penicillin, and GBS that is not resistant to antibiotics tested (vancomycin, penicillin, amoxicillin, etc). The most likely cause of her WBC increase is soft tissue debridement.  -Continue cefazolin 2mg  for a total of 6 weeks, and monitor for any other SIRS signs. Consider changing antibiotics if patient deteriorates. -Patient's  blood cultures have been negative, so she will be able to have a PICC placed before going home,  -Consult cardiac thoracic surgery for TEE results.  2. Falls/dizziness The most likely causes are venodilation due to bacteremia or benign paroxysmal positional vertigo. Patient has responded well to PT canalith maneuvers.  -Treat bacteremia with antibiotics  3. Blood in stool Patient did not complain of GI bleed upon further questioning, and denied having black tarry stool. Despite patient's conflicting history, it is unlikely that patient has GI bleeding because her hemoglobin has been stable at 12 for 2-3 days now. She initially came in with high Hgb, but that could be due to hemoconcentration that became diluted as she drink and ate well in the hospital. The positive FOBT could be from rectal hemorrhoids, which is yet to be investigated with rectal exam.   -Perform rectal exam for any anal bleeding, if there's indication of hemorrhoid bleeding, treat hemorrhoids. If there's indication for upper GI bleed, we can recommend upper GI endoscopy. -Hold heparin -Follow CBC  4. Foot ulcers and LE swelling Patient received debridement on Saturday, and symptoms are stable. There is no evidence of DVT. ABIs are within normal, not suggesting any active clots. Patient will have wound consult, and receive post-op shoe fitting to better manage foot ulcers.  -Wound consult on how to take care of wound -Shoe fitting with wound consult  5. COPD  Patient has worsening chronic cough and new yellow/green sputum production. She has a 60 pack years of smoking history. She reported worsening cough, change in sputum quality, and SOB, which indicates COPD exacerbation. Thus, she was started on oral prednisone.  -Prednisone PO  -levalbuterol, ipratropium nebs  -Mometasone-formoterol inhaler   6. Pulmonary edema  -Patient showed signs of pulmonary edema on CXR, but EF was 60-65% on recent ECHO. However she complained of  SOB, her foot are swollen, and her pro-BNP was elevated to the 700s. She does not appear to have heart failure clinically. Thus, we will stop furosemide since she may be bacteremic that could lead to sepsis.  -monitor BNP level   7. Headache  Patient has had HA for several days, unclear if preceded or followed falls. No visual changes or pain on palpation of temporal artery, CT unremarkable. ESR is elevated, but patient also has a bacterial infection at the time of ESR check.  -Monitor headache.   8. Hyperglycemia in setting of T2DM  Patient has been non adherent, and has received diabetic education while in the hospital.  -Home insulin regiemtn with Novolog 125U qAM adn 65U  qPM  -SSI  -Follow Bmet and AG   9. HTN  -Continue home regimen of atenolo, HCTZ, and benazepril   10. Peripheral vascular disease  -Continue clopedogrel   11. Depression/mood disorder  -Continue escitalopram   12. Fibromyalgia  -Continue pregabalin   13: Nicotine withdrawal  -nicotine patch 14mg    Proph:  -DVT: stop heparin due to GI bleed -CAD in the setting of T2DM: aspirin, simvastatin  -GI: pantoprazole    LOS: 7 days   This is a Psychologist, occupational Note.  The care of the patient was discussed with Dr. Daiva Eves, Dr. Dierdre Searles, and Dr. Burtis Junes, and the assessment and plan formulated with their assistance.  Please see their attached note for official documentation of the daily encounter.  Rhea Pink 02/01/2013, 8:31 AM

## 2013-02-02 ENCOUNTER — Telehealth: Payer: Self-pay | Admitting: Dietician

## 2013-02-02 ENCOUNTER — Other Ambulatory Visit: Payer: Self-pay | Admitting: *Deleted

## 2013-02-02 DIAGNOSIS — I10 Essential (primary) hypertension: Secondary | ICD-10-CM

## 2013-02-02 LAB — CULTURE, BLOOD (ROUTINE X 2)
Culture: NO GROWTH
Culture: NO GROWTH

## 2013-02-02 NOTE — Telephone Encounter (Signed)
I cannot fill as I have NEVER seen pt and she was just D/C'd hospital yesterday and not on med list. Will defer to inpt team.

## 2013-02-02 NOTE — Discharge Summary (Signed)
Internal Medicine Teaching [facility] Discharge Note  Name: Jennifer [name] MRN: [id number] DOB: 10-24-1961 52 y.o.  Date of Admission: 01/25/2013  8:43 PM Date of Discharge: 02/02/2013 Attending Physician: No att. providers found  Discharge Diagnosis: Principal Problem:   Falls Active Problems:   DM W/MANIFESTATION NEC, TYPE II, UNCONTROLLED   HYPERTENSION   COPD   PVD (peripheral vascular disease) with claudication   Right ankle swelling   Diabetic foot ulcers   Bacteremia  Discharge Medications:   Medication List    STOP taking these medications       DULoxetine 30 MG capsule  Commonly known as:  CYMBALTA      TAKE these medications       ADVAIR DISKUS 250-50 MCG/DOSE Aepb  Generic drug:  Fluticasone-Salmeterol  INHALE 1 PUFF INTO THE LUNG TWICE A DAY     albuterol 108 (90 BASE) MCG/ACT inhaler  Commonly known as:  PROVENTIL HFA;VENTOLIN HFA  Inhale 2 puffs into the lungs every 6 (six) hours as needed. For breathing     aspirin 81 MG tablet  Take 81 mg by mouth daily.     atenolol 100 MG tablet  Commonly known as:  TENORMIN  TAKE 1 TABLET (100 MG TOTAL) BY MOUTH DAILY.     baclofen 10 MG tablet  Commonly known as:  LIORESAL  TAKE 1 TABLET THREE TIMES A DAY     benazepril 40 MG tablet  Commonly known as:  LOTENSIN  TAKE 1 TABLET BY MOUTH EVERY DAY     ceFAZolin 2-3 GM-% Solr  Commonly known as:  ANCEF  Inject 50 mLs (2 g total) into the vein every 8 (eight) hours.     clopidogrel 75 MG tablet  Commonly known as:  PLAVIX  Take 1 tablet (75 mg total) by mouth daily.     diclofenac sodium 1 % Gel  Commonly known as:  VOLTAREN  Apply 2 g topically 3 (three) times daily. To abdomen     escitalopram 20 MG tablet  Commonly known as:  LEXAPRO  Take 1 tablet (20 mg total) by mouth daily.     fluticasone 50 MCG/ACT nasal spray  Commonly known as:  FLONASE  Place 1 spray into the nose 2 (two) times daily.     hydrochlorothiazide 25 MG tablet   Commonly known as:  HYDRODIURIL  TAKE 1 TABLET (25 MG TOTAL) BY MOUTH DAILY.     insulin lispro protamine-insulin lispro (75-25) 100 UNIT/ML Susp  Commonly known as:  HUMALOG 75/25  Inject 65-125 Units into the skin 2 (two) times daily with a meal. Use 125 units every morning and use 65 units every evening     LYRICA 100 MG capsule  Generic drug:  pregabalin  TAKE 2 CAPSULES BY MOUTH 3 TIMES DAILY     metFORMIN 1000 MG tablet  Commonly known as:  GLUCOPHAGE  Take 1 tablet (1,000 mg total) by mouth 2 (two) times daily with a meal.     NEXIUM 20 MG capsule  Generic drug:  esomeprazole  TAKE 1 CAPSULE (20 MG TOTAL) BY MOUTH DAILY.     pravastatin 40 MG tablet  Commonly known as:  PRAVACHOL  Take 1 tablet (40 mg total) by mouth every evening.     predniSONE 10 MG tablet  Commonly known as:  DELTASONE  Take 4 tablets (40 mg total) by mouth daily with breakfast. Take 40mg  for 3 days, 20mg  for 3 days, 10mg  for 3 days, then stop  Disposition and follow-up:   Ms.Cedrica L Barnard was discharged from [facility] in Good condition.  At the hospital follow up visit please address    Follow-up Appointments:     Follow-up Information   Follow up with DUDA,MARCUS V, MD In 2 weeks. (May follow up with either myself or the wound center in 2 weeks.)    Contact information:   [address] [place] [address] [place] [address] [phone]       Call Blanch Media, MD. (please call to make an appointment tomorrow)    Contact information:   [address] [place] [address] [phone]      Discharge Orders   Future Appointments Provider Department Dept Phone   03/01/2013 11:15 AM Burns Spain, MD [facility] [phone]   Future Orders Complete By Expires     Diet - low sodium heart healthy  As directed     Increase activity slowly  As directed        Consultations: Treatment Team:  [name], MD  Procedures  Performed:  Ct Head Wo Contrast  01/26/2013  *RADIOLOGY REPORT*  Clinical Data:  Episode of passing out.  Headache.  Diabetic hypertensive patient with hyperlipidemia.  CT HEAD WITHOUT CONTRAST CT CERVICAL SPINE WITHOUT CONTRAST  Technique:  Multidetector CT imaging of the head and cervical spine was performed following the standard protocol without intravenous contrast.  Multiplanar CT image reconstructions of the cervical spine were also generated.  Comparison:  08/18/2007 MR brain.  CT HEAD  Findings: No skull fracture or intracranial hemorrhage.  No hydrocephalus.  No CT evidence of large acute infarct.  Scattered mild nonspecific white matter type changes.  Exophthalmos.  Mastoid air cells, middle ear cavities and visualized paranasal sinuses are clear.  IMPRESSION: No acute abnormality.  Please see above.  CT CERVICAL SPINE  Findings: Evaluation limited by patient's habitus.  No cervical spine fracture or malalignment is noted.  Carotid artery calcifications.  Heterogeneous slightly prominent appearance of the thyroid gland.  IMPRESSION: Evaluation limited by patient's habitus.  No cervical spine fracture or malalignment is noted.  Carotid artery calcifications.  Heterogeneous slightly prominent appearance of the thyroid gland.   Original Report Authenticated By: Lacy Duverney, M.D.    Ct Cervical Spine Wo Contrast  01/26/2013  *RADIOLOGY REPORT*  Clinical Data:  Episode of passing out.  Headache.  Diabetic hypertensive patient with hyperlipidemia.  CT HEAD WITHOUT CONTRAST CT CERVICAL SPINE WITHOUT CONTRAST  Technique:  Multidetector CT imaging of the head and cervical spine was performed following the standard protocol without intravenous contrast.  Multiplanar CT image reconstructions of the cervical spine were also generated.  Comparison:  08/18/2007 MR brain.  CT HEAD  Findings: No skull fracture or intracranial hemorrhage.  No hydrocephalus.  No CT evidence of large acute infarct.  Scattered mild  nonspecific white matter type changes.  Exophthalmos.  Mastoid air cells, middle ear cavities and visualized paranasal sinuses are clear.  IMPRESSION: No acute abnormality.  Please see above.  CT CERVICAL SPINE  Findings: Evaluation limited by patient's habitus.  No cervical spine fracture or malalignment is noted.  Carotid artery calcifications.  Heterogeneous slightly prominent appearance of the thyroid gland.  IMPRESSION: Evaluation limited by patient's habitus.  No cervical spine fracture or malalignment is noted.  Carotid artery calcifications.  Heterogeneous slightly prominent appearance of the thyroid gland.   Original Report Authenticated By: Lacy Duverney, M.D.    Mr Foot Right W Wo Contrast  01/27/2013  *RADIOLOGY REPORT*  Clinical Data: Right foot and ankle edema, erythema and medial plantar wound.  MRI OF THE RIGHT FOREFOOT WITHOUT AND WITH CONTRAST  Technique:  Multiplanar, multisequence MR imaging was performed both before and after administration of intravenous contrast.  Contrast: 20mL MULTIHANCE GADOBENATE DIMEGLUMINE 529 MG/ML IV SOLN  Comparison: Toe radiographs 07/09/2012.  MRI 07/30/2010.  Findings: The right forefoot exam quality is significantly better than that of the opposite foot.  There is no significant motion artifact.  Examination includes most of the forefoot and midfoot.  There is diffuse some dorsal subcutaneous edema.  There is edema and enhancement throughout the flexor musculature. The muscles are diffusely atrophied on T1-weighted images.  No focal fluid collections are identified.  Again demonstrated is a well-circumscribed 3.5 cm lipoma plantar to the first and second metatarsals.  This appears unchanged.  There is no associated osseous erosion.  The distal fourth phalanx appears chronically eroded, not grossly changed from the radiographs of 7 months prior.  No acute bone destruction or suspicious marrow enhancement is identified to suggest acute osteomyelitis.  There is  diffuse nodularity of the plantar fascia within the midfoot and forefoot which is similar to the prior examination and suspicious for plantar fibromatosis.  IMPRESSION:  1.  No evidence of right forefoot soft tissue abscess or acute osteomyelitis. 2.  Stable erosion of the distal fourth phalanx from radiographs of 07/09/2012.  This may indicate chronic osteomyelitis. 3.  Stable chronic myopathic changes and lipoma within the forefoot. 4.  Stable nodularity of the plantar fascia suspicious for plantar fibromatosis.   Original Report Authenticated By: Carey Bullocks, M.D.    Mr Foot Left W Wo Contrast  01/27/2013  *RADIOLOGY REPORT*  Clinical Data: Left foot edema and erythema with medial plantar wound.  MRI OF THE LEFT FOREFOOT WITHOUT AND WITH CONTRAST  Technique:  Multiplanar, multisequence MR imaging was performed both before and after administration of intravenous contrast.  Contrast:  20 ml Multihance.  Comparison: Radiographs 08/11/2010.  Findings: Study is moderately motion degraded.  A variably sized field of view includes most of the forefoot and midfoot.  The distal toes and hind foot are incompletely visualized.  There is mild dorsal subcutaneous edema.  There is edema and enhancement throughout the flexor musculature of the forefoot.  No focal fluid collection is identified. There appears to be focal soft tissue enhancement within the plantar aspect of the second webspace.  This area is not well visualized on T2-weighted images.  There is irregular nodular thickening of the medial cord of the plantar fascia within the forefoot.  No discontinuity is identified.  On the sagittal images, the medial cord is mildly thickened adjacent to a plantar calcaneal spur.  There is no evidence of bone destruction or suspicious enhancement to suggest osteomyelitis.  Mild degenerative changes are present at the Lisfranc joint.  IMPRESSION:  1.  No evidence of soft tissue abscess or osteomyelitis. 2.  Nonspecific  forefoot muscular edema and enhancement could represent myositis or diabetic myopathy. 3.  Focal enhancement within the plantar aspect of the second webspace.  This could indicate a Morton's neuroma. 4.  Nodular thickening of the medial cord of the plantar fascia is suspicious for plantar fibromatosis.   Original Report Authenticated By: Carey Bullocks, M.D.    Dg Chest Portable 1 View  01/25/2013  *RADIOLOGY REPORT*  Clinical Data: Loss of consciousness.  Severe shortness of breath. History of smoking, COPD, diabetes, hypertension.  PORTABLE CHEST - 1 VIEW  Comparison: 09/04/2011  Findings: The heart is mildly enlarged.  There are interstitial changes consistent with mild edema.  No focal consolidations or pleural effusions are identified.  IMPRESSION: Changes consistent with mild pulmonary edema.   Original Report Authenticated By: Norva Pavlov, M.D.        Hospital Course by problem list: Principal Problem:   Falls Active Problems:   DM W/MANIFESTATION NEC, TYPE II, UNCONTROLLED   HYPERTENSION   COPD   PVD (peripheral vascular disease) with claudication   Right ankle swelling   Diabetic foot ulcers   Bacteremia   Discharge Vitals:  BP 163/70  Pulse 56  Temp(Src) 98 F (36.7 C) (Oral)  Resp 20  Ht 5\' 7"  (1.702 m)  Wt 269 lb (122.018 kg)  BMI 42.12 kg/m2  SpO2 93%  Discharge Labs:  Results for orders placed during the hospital encounter of 01/25/13 (from the past 24 hour(s))  GLUCOSE, CAPILLARY     Status: Abnormal   Collection Time    02/01/13 11:17 AM      Result Value Range   Glucose-Capillary 148 (*) 70 - 99 mg/dL  GLUCOSE, CAPILLARY     Status: Abnormal   Collection Time    02/01/13  5:02 PM      Result Value Range   Glucose-Capillary 252 (*) 70 - 99 mg/dL   Comment 1 Notify RN      Signed: Christen Bame 02/02/2013, 9:59 AM   Time Spent on Discharge:  Services Ordered on Discharge:  Equipment Ordered on Discharge:   Internal Medicine Teaching Service  Attending Note Date: 02/03/2013  Patient name: LAVEDA DEMEDEIROS  Medical record number: [id number]  Date of birth: 1961-05-11    This patient has been seen and discussed with the house staff. Please see their note for complete details. I concur with their findings with the following additions/corrections:  pLEASE see MS III DC summary  Acey Lav 02/03/2013, 6:54 AM

## 2013-02-02 NOTE — Telephone Encounter (Signed)
Discharge date:02-01-13 Call date: 02-02-13 Hospital follow up appointment date: March 11th with Dr. Rogelia Boga at 11:15 am.   Calling to assist with transition of care from hospital to home.  Discharge medications reviewed:no, did discuss prednisone and blood sugars and what to do if they get too high and stay high  Able to fill all prescriptions?yes, but did not get Rx for pain meds, - will ask triage to handle this.   Patient aware of hospital follow up appointments. Yes she is.  Any problems with transportation?. Patient knows to call [facility]  Other problems/concerns:she made appointment with CDE

## 2013-02-04 LAB — CULTURE, BLOOD (ROUTINE X 2)
Culture: NO GROWTH
Culture: NO GROWTH

## 2013-02-05 ENCOUNTER — Other Ambulatory Visit: Payer: Self-pay | Admitting: Internal Medicine

## 2013-02-05 LAB — CULTURE, BLOOD (ROUTINE X 2)
Culture: NO GROWTH
Culture: NO GROWTH

## 2013-02-07 NOTE — Telephone Encounter (Signed)
Has appt March with me. I have never seen her before.

## 2013-02-08 NOTE — Telephone Encounter (Signed)
Patient is on Lyrica. We did not give her Vicodin upon discharge. We will decline the refill request. She should follow up with the clinic for HFU and evaluation. Thank you

## 2013-02-16 ENCOUNTER — Encounter: Payer: Self-pay | Admitting: Infectious Disease

## 2013-02-16 ENCOUNTER — Ambulatory Visit (INDEPENDENT_AMBULATORY_CARE_PROVIDER_SITE_OTHER): Payer: Medicare Other | Admitting: Infectious Disease

## 2013-02-16 VITALS — BP 148/83 | HR 73 | Temp 99.4°F | Ht 67.0 in | Wt 277.5 lb

## 2013-02-16 DIAGNOSIS — I38 Endocarditis, valve unspecified: Secondary | ICD-10-CM

## 2013-02-16 DIAGNOSIS — M869 Osteomyelitis, unspecified: Secondary | ICD-10-CM

## 2013-02-16 DIAGNOSIS — A4101 Sepsis due to Methicillin susceptible Staphylococcus aureus: Secondary | ICD-10-CM

## 2013-02-16 DIAGNOSIS — T85698A Other mechanical complication of other specified internal prosthetic devices, implants and grafts, initial encounter: Secondary | ICD-10-CM

## 2013-02-16 DIAGNOSIS — A491 Streptococcal infection, unspecified site: Secondary | ICD-10-CM

## 2013-02-16 DIAGNOSIS — T82898A Other specified complication of vascular prosthetic devices, implants and grafts, initial encounter: Secondary | ICD-10-CM

## 2013-02-16 DIAGNOSIS — I330 Acute and subacute infective endocarditis: Secondary | ICD-10-CM

## 2013-02-16 DIAGNOSIS — B951 Streptococcus, group B, as the cause of diseases classified elsewhere: Secondary | ICD-10-CM

## 2013-02-16 NOTE — Progress Notes (Signed)
Subjective:    [name], female    DOB: Jun 15, 1961, 52 y.o.   MRN: [id number]  HPI  52 year old African American lady with diabetes peripheral neuropathy and chronic foot ulcers who was admitted to my service earlier this month with polymicrobial bacteremia with MSSA/GBS in 2/2 cultures and discolored feet, worsening of her ulcers. She had MRI which showed possible chronic osteo 4th phalanx, no abscess. Dr. Lajoyce Corners saw pt and performed bedside debridement of tissue of right foot. TEE showed MV vegetation with possible  tertiary chordae rupture and TV vegetations . Patient was seen by Dr Donata Clay with CVTS as well. Patient cleared his bacteremia by the 6th of February and was narrowed to IV ancef 2g IV q 8 hours with plans for at least 6 weeks. She feels that her foot ulcers are stable although they still bother her particularly left-sided 1. She had no new lesions on her feet. She has had no fevers malaise nausea or diarrhea. She has hasn't irritation at the insertion site of the PICC line.  I spent greater than 45 minutes with the patient including greater than 50% of time in face to face counsel of the patient and in coordination of their care.   Review of Systems  Constitutional: Negative for fever, chills, diaphoresis, activity change, appetite change, fatigue and unexpected weight change.  HENT: Negative for congestion, sore throat, rhinorrhea, sneezing, trouble swallowing and sinus pressure.   Eyes: Negative for photophobia and visual disturbance.  Respiratory: Negative for cough, chest tightness, shortness of breath, wheezing and stridor.   Cardiovascular: Negative for chest pain, palpitations and leg swelling.  Gastrointestinal: Negative for nausea, vomiting, abdominal pain, diarrhea, constipation, blood in stool, abdominal distention and anal bleeding.  Genitourinary: Negative for dysuria, hematuria, flank pain and difficulty urinating.  Musculoskeletal: Negative for  myalgias, back pain, joint swelling, arthralgias and gait problem.  Skin: Positive for wound. Negative for color change, pallor and rash.  Neurological: Negative for dizziness, tremors, weakness and light-headedness.  Hematological: Negative for adenopathy. Does not bruise/bleed easily.  Psychiatric/Behavioral: Negative for behavioral problems, confusion, sleep disturbance, dysphoric mood, decreased concentration and agitation.       Objective:   Physical Exam  Constitutional: She is oriented to person, place, and time. She appears well-developed and well-nourished. No distress.  HENT:  Head: Normocephalic and atraumatic.  Mouth/Throat: Oropharynx is clear and moist. No oropharyngeal exudate.  Eyes: Conjunctivae and EOM are normal. Pupils are equal, round, and reactive to light. No scleral icterus.  Neck: Normal range of motion. Neck supple. No JVD present.  Cardiovascular: Normal rate, regular rhythm and normal heart sounds.  Exam reveals no gallop and no friction rub.   No murmur heard. Pulmonary/Chest: Effort normal and breath sounds normal. No respiratory distress. She has no wheezes. She has no rales. She exhibits no tenderness.  Abdominal: She exhibits no distension and no mass. There is no tenderness. There is no rebound and no guarding.  Musculoskeletal: She exhibits no edema and no tenderness.       Feet:  Lymphadenopathy:    She has no cervical adenopathy.  Neurological: She is alert and oriented to person, place, and time. She has normal reflexes. She exhibits normal muscle tone. Coordination normal.  Skin: Skin is warm and dry. She is not diaphoretic.     Psychiatric: She has a normal mood and affect. Her behavior is normal. Judgment and thought content normal.  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 Donata Clay --source was undoubtedly feet  Chronic osteomyelitis and foot ulcers: --fu with Dr. Lajoyce Corners --as above prolonged abx, consideration for oral abx post IV ---recheck inflammatory markers at next visit  Picc line irritation: does not appear infected. Ask RN to go out and recheck later this week.

## 2013-02-16 NOTE — Patient Instructions (Addendum)
945 appt available on 03/09/13  Antibiotics should continue thru that date at the earliest and possibly another 2 weeks after that

## 2013-02-23 ENCOUNTER — Ambulatory Visit
Admission: RE | Admit: 2013-02-23 | Discharge: 2013-02-23 | Disposition: A | Discharge status: Home / Self Care | Payer: Medicare Other | Source: Ambulatory Visit | Attending: Cardiothoracic Surgery | Admitting: Cardiothoracic Surgery

## 2013-02-23 ENCOUNTER — Other Ambulatory Visit: Payer: Self-pay | Admitting: *Deleted

## 2013-02-23 ENCOUNTER — Ambulatory Visit (INDEPENDENT_AMBULATORY_CARE_PROVIDER_SITE_OTHER): Payer: Medicare Other | Admitting: Cardiothoracic Surgery

## 2013-02-23 ENCOUNTER — Encounter: Payer: Self-pay | Admitting: Cardiothoracic Surgery

## 2013-02-23 VITALS — BP 183/89 | HR 83 | Resp 18 | Ht 67.0 in | Wt 277.0 lb

## 2013-02-23 DIAGNOSIS — I330 Acute and subacute infective endocarditis: Secondary | ICD-10-CM

## 2013-02-23 DIAGNOSIS — I38 Endocarditis, valve unspecified: Secondary | ICD-10-CM

## 2013-02-23 DIAGNOSIS — I10 Essential (primary) hypertension: Secondary | ICD-10-CM

## 2013-02-23 NOTE — Progress Notes (Signed)
PCP is Blanch Media, MD Referring Provider is Daiva Eves, Lisette Grinder, MD  Chief Complaint  Patient presents with  . Endocarditis    3 wk f/u with cxr    HPI: Office followup after hospital consult on this 52 year old obese diabetic smoker with clinical diagnosis of mitral endocarditis based on positive blood cultures with both the staph-methicillin sensitive and strep. Source of bacteremia was diabetic foot ulcers which are being followed by Dr. Lajoyce Corners . She was placed on a six-week course of IV Ancef 3 doses daily at home via PICC line under direction of ID-Dr. Zenaida Niece dam. The patient's 2-D echocardiogram showed no significant MR but there was a possible vegetation on the posterior leaflet versus a ruptured cord. Her blood cultures were negative prior to her discharge from the hospital  The patient denies any symptoms of CHF or recurrent fever. Unfortunately she continues to smoke 1.5 packs per day She is been compliant with her home IV antibiotic regimen under the direction of a [facility]  a assigned nurse.  Past Medical History  Diagnosis Date  . Depression   . Diabetes mellitus   . GERD (gastroesophageal reflux disease)   . Hyperlipidemia   . Hypertension   . Chronic kidney disease   . Glaucoma   . COPD (chronic obstructive pulmonary disease)   . Chronic shoulder pain   . Chronic back pain   . PVD (peripheral vascular disease) with claudication     Stents to bilateral common iliac arteries, on chronic plavix  . Tinnitus   . Peripheral neuropathy   . Fibromyalgia   . Colon polyp   . Diverticula of colon   . Diverticulosis   . Asthma     Past Surgical History  Procedure Laterality Date  . Abdominal hysterectomy    . Tubal ligation    . Rotator cuff repair      bilateral  . Breast biopsy      multiple-benign per pt  . Wrist surgery      right  . Ganglion cyst excision      multiple  . Other surgical history      stents in lower ext  . Tee without cardioversion N/A  01/31/2013    Procedure: TRANSESOPHAGEAL ECHOCARDIOGRAM (TEE);  Surgeon: Pricilla Riffle, MD;  Location: [facility] ENDOSCOPY;  Service: Cardiovascular;  Laterality: N/A;  Rm [phone]    No family history on file.  Social History History  Substance Use Topics  . Smoking status: Current Every Day Smoker -- 1.50 packs/day for 40 years    Types: Cigarettes  . Smokeless tobacco: Current User    Types: Snuff     Comment: Half pack or less.  . Alcohol Use: No    Current Outpatient Prescriptions  Medication Sig Dispense Refill  . ADVAIR DISKUS 250-50 MCG/DOSE AEPB INHALE 1 PUFF INTO THE LUNG TWICE A DAY  60 each  2  . albuterol (PROVENTIL HFA;VENTOLIN HFA) 108 (90 BASE) MCG/ACT inhaler Inhale 2 puffs into the lungs every 6 (six) hours as needed. For breathing      . amLODipine-benazepril (LOTREL) 10-40 MG per capsule       . aspirin 81 MG tablet Take 81 mg by mouth daily.        Marland Kitchen atenolol (TENORMIN) 100 MG tablet TAKE 1 TABLET (100 MG TOTAL) BY MOUTH DAILY.  30 tablet  9  . baclofen (LIORESAL) 10 MG tablet TAKE 1 TABLET THREE TIMES A DAY  90 tablet  1  . benazepril (LOTENSIN)  40 MG tablet TAKE 1 TABLET BY MOUTH EVERY DAY  30 tablet  3  . ceFAZolin (ANCEF) 2-3 GM-% SOLR Inject 50 mLs (2 g total) into the vein every 8 (eight) hours.  1 each  0  . clopidogrel (PLAVIX) 75 MG tablet Take 1 tablet (75 mg total) by mouth daily.  30 tablet  11  . diclofenac sodium (VOLTAREN) 1 % GEL Apply 2 g topically 3 (three) times daily. To abdomen  100 g  0  . escitalopram (LEXAPRO) 20 MG tablet Take 1 tablet (20 mg total) by mouth daily.  30 tablet  6  . fluticasone (FLONASE) 50 MCG/ACT nasal spray Place 1 spray into the nose 2 (two) times daily.  16 g  2  . hydrochlorothiazide (HYDRODIURIL) 25 MG tablet TAKE 1 TABLET (25 MG TOTAL) BY MOUTH DAILY.  90 tablet  1  . insulin lispro protamine-insulin lispro (HUMALOG 75/25) (75-25) 100 UNIT/ML SUSP Inject 65-125 Units into the skin 2 (two) times daily with a meal. Use 125 units  every morning and use 65 units every evening      . LYRICA 100 MG capsule TAKE 2 CAPSULES BY MOUTH 3 TIMES DAILY  160 capsule  3  . metFORMIN (GLUCOPHAGE) 1000 MG tablet Take 1 tablet (1,000 mg total) by mouth 2 (two) times daily with a meal.  60 tablet  11  . NEXIUM 20 MG capsule TAKE 1 CAPSULE (20 MG TOTAL) BY MOUTH DAILY.  30 capsule  4  . pravastatin (PRAVACHOL) 40 MG tablet Take 1 tablet (40 mg total) by mouth every evening.  30 tablet  11  . predniSONE (DELTASONE) 10 MG tablet Take 4 tablets (40 mg total) by mouth daily with breakfast. Take 40mg  for 3 days, 20mg  for 3 days, 10mg  for 3 days, then stop  24 tablet  0  . silver sulfADIAZINE (SILVADENE) 1 % cream        No current facility-administered medications for this visit.    Allergies  Allergen Reactions  . 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 Morphine Sulfate     REACTION: hives  . Vicodin (Hydrocodone-Acetaminophen) Itching    Review of Systems no fever no shortness of breath PICC line without evidence of infection  BP 183/89  Pulse 83  Resp 18  Ht 5\' 7"  (1.702 m)  Wt 277 lb (125.646 kg)  BMI 43.37 kg/m2  SpO2 94% Physical Exam Alert and comfortable Breath sounds clear and equal No JVD no adenopathy in the neck, she is edentulous Cardiac rhythm regular murmur Both feet are wrapped in gauze bandages Neuro intact   Diagnostic Tests: Chest x-ray reviewed. No evidence Cardinale or CHF, no evidence of pleural effusion  Impression: Patient appears to be responding to long-term IV antibiotics for course of bacteremia with associated probable endocarditis without significant valvular insufficiency-mitral valve abnormality on echo.  Plan:I will plan on seeing her back in approximately 4 weeks and we'll arrange to have a outpatient 2-D echo at the end of her course of antibiotic. All questions answered.

## 2013-02-26 ENCOUNTER — Telehealth: Payer: Self-pay | Admitting: Internal Medicine

## 2013-02-26 NOTE — Telephone Encounter (Signed)
Patient awakened to some blood in her underwear. She says she has had a hole on her stomach about the size of a dime. She has a guaze pad on it currently which stopped the blood. She states the hole is above her vagina and below a past surgical incision. It normally does not drain and has not bled before. She is still taking IV antibiotics for a blood infection and wants to know if this can affect that. She is not feeling light headed or dizzy or cold. She already has clinic appointment on the 11th and advised to keep that. If bleeding restarts or she starts feeling bad she was advised to seek medical attention and/or call 911.  Jennifer [name] 9:38 PM 02/26/2013

## 2013-02-28 ENCOUNTER — Encounter: Payer: Self-pay | Admitting: Internal Medicine

## 2013-02-28 ENCOUNTER — Other Ambulatory Visit: Payer: Self-pay | Admitting: Internal Medicine

## 2013-02-28 ENCOUNTER — Other Ambulatory Visit: Payer: Self-pay | Admitting: *Deleted

## 2013-02-28 NOTE — Telephone Encounter (Signed)
This is not on D/C summary and not on med list (was added by [facility] but not filled or addressed). I have never seen pt. She has appt tomorrow. Will do med rec then

## 2013-02-28 NOTE — Telephone Encounter (Signed)
Call from [facility] with [facility] - # [phone] Pt was seen yesterday at home and reported a boil in fold of abd, near umbilicus.  It ruptured Saturday night. The area is small, 1/4 cm X 1/4 cm.  Nurse washed area with warm water dried and applied antibiotic ointment. Pt will care for this herself.  She has had boils in past. Pt is doing well. VS stable.  Pt is on antibiotics. Site looks good.    If you would different care to area please advise.

## 2013-02-28 NOTE — Telephone Encounter (Signed)
Rx called in to pharmacy. 

## 2013-03-01 ENCOUNTER — Encounter: Payer: Self-pay | Admitting: Internal Medicine

## 2013-03-01 ENCOUNTER — Ambulatory Visit (INDEPENDENT_AMBULATORY_CARE_PROVIDER_SITE_OTHER): Payer: Medicare Other | Admitting: Dietician

## 2013-03-01 ENCOUNTER — Ambulatory Visit (INDEPENDENT_AMBULATORY_CARE_PROVIDER_SITE_OTHER): Payer: Medicare Other | Admitting: Internal Medicine

## 2013-03-01 ENCOUNTER — Encounter: Payer: Self-pay | Admitting: Licensed Clinical Social Worker

## 2013-03-01 VITALS — BP 149/80 | HR 81 | Temp 97.3°F | Ht 67.0 in | Wt 289.8 lb

## 2013-03-01 DIAGNOSIS — E785 Hyperlipidemia, unspecified: Secondary | ICD-10-CM

## 2013-03-01 DIAGNOSIS — E669 Obesity, unspecified: Secondary | ICD-10-CM

## 2013-03-01 DIAGNOSIS — R269 Unspecified abnormalities of gait and mobility: Secondary | ICD-10-CM | POA: Insufficient documentation

## 2013-03-01 DIAGNOSIS — F17200 Nicotine dependence, unspecified, uncomplicated: Secondary | ICD-10-CM

## 2013-03-01 DIAGNOSIS — R7881 Bacteremia: Secondary | ICD-10-CM

## 2013-03-01 DIAGNOSIS — F3289 Other specified depressive episodes: Secondary | ICD-10-CM

## 2013-03-01 DIAGNOSIS — G894 Chronic pain syndrome: Secondary | ICD-10-CM

## 2013-03-01 DIAGNOSIS — I10 Essential (primary) hypertension: Secondary | ICD-10-CM

## 2013-03-01 DIAGNOSIS — I739 Peripheral vascular disease, unspecified: Secondary | ICD-10-CM

## 2013-03-01 DIAGNOSIS — J449 Chronic obstructive pulmonary disease, unspecified: Secondary | ICD-10-CM

## 2013-03-01 DIAGNOSIS — Z0000 Encounter for general adult medical examination without abnormal findings: Secondary | ICD-10-CM

## 2013-03-01 DIAGNOSIS — L98499 Non-pressure chronic ulcer of skin of other sites with unspecified severity: Secondary | ICD-10-CM

## 2013-03-01 DIAGNOSIS — K219 Gastro-esophageal reflux disease without esophagitis: Secondary | ICD-10-CM

## 2013-03-01 DIAGNOSIS — I798 Other disorders of arteries, arterioles and capillaries in diseases classified elsewhere: Secondary | ICD-10-CM

## 2013-03-01 DIAGNOSIS — F329 Major depressive disorder, single episode, unspecified: Secondary | ICD-10-CM

## 2013-03-01 DIAGNOSIS — J4489 Other specified chronic obstructive pulmonary disease: Secondary | ICD-10-CM

## 2013-03-01 DIAGNOSIS — E1159 Type 2 diabetes mellitus with other circulatory complications: Secondary | ICD-10-CM

## 2013-03-01 HISTORY — DX: Unspecified abnormalities of gait and mobility: R26.9

## 2013-03-01 LAB — GLUCOSE, CAPILLARY: Glucose-Capillary: 163 mg/dL — ABNORMAL HIGH (ref 70–99)

## 2013-03-01 MED ORDER — BUPROPION HCL ER (SR) 150 MG PO TB12: ORAL_TABLET | ORAL | Status: DC

## 2013-03-01 MED ORDER — NICOTINE 21 MG/24HR TD PT24: 1.0000 | MEDICATED_PATCH | TRANSDERMAL | Status: DC

## 2013-03-01 NOTE — Assessment & Plan Note (Signed)
Lab Results  Component Value Date   HGBA1C 9.5* 01/26/2013   HGBA1C 9.0 11/26/2012   HGBA1C 8.3 08/24/2012     Assessment:  Diabetes control: poor control (HgbA1C >9%)  Progress toward A1C goal:  deteriorated  Comments: Normally checks her CBG 3 or 4 times daily but strips were sent in to check once daily. Her CBG's are all over the place. 59 to high 200's in AM. Lows around lunch. Has worked with [name] before. Understand relationship btw DM and poor wound healing / foot ulcers.  Plan:  Medications:  continue current medications  Home glucose monitoring:   Frequency: 4 times a day   Timing: before breakfast;before lunch;before dinner;at bedtime  Instruction/counseling given: other instruction/counseling: Seeing Tobey Bride today  Educational resources provided: handout  Self management tools provided: home glucose logbook  Other plans: Will have pt speak to [name] and discuss with [name].

## 2013-03-01 NOTE — Assessment & Plan Note (Signed)
Assessment:  Comments: Has had falls. At risk 2/2 feet ulcers and bandaged, neuropathy, deconditioning, opioids, & obesity.   Plan:  Medications:  NA  Educational resources provided: handout  Self management tools provided: CDC handout and tool  Referrals for care management support: diabetes educator;smoking cessation counselor;social worker  Referrals to community resources:    Other plans: Gave CDC handout. Did not refer to PT as pt states Lajoyce Corners told her to stay off feet for now.

## 2013-03-01 NOTE — Assessment & Plan Note (Signed)
Sxs controlled when on her PPI

## 2013-03-01 NOTE — Assessment & Plan Note (Signed)
BP Readings from Last 3 Encounters:  03/01/13 149/80  02/23/13 183/89  02/16/13 148/83    Lab Results  Component Value Date   NA 140 02/01/2013   K 3.8 02/01/2013   CREATININE 0.82 02/01/2013    Assessment:  Blood pressure control: moderately elevated  Progress toward BP goal:  deteriorated  Comments: Elevated today. She did not have her meds or med list. Did not recognize the name Lotrel. Stated on Lotensin, atenolol, and HCTZ.   Plan:  Medications:  continue current medications  Educational resources provided: handout  Self management tools provided: home blood pressure logbook  Other plans: Today is the first day I am seeing her and trying to learn her hx. Will need BP escalation and sch appt in 2 months to recheck and escalate meds.

## 2013-03-01 NOTE — Progress Notes (Signed)
CSW met with [name] following her scheduled [facility] appt.  [name] states she is ready to quit smoking and has promised a family member that she will quit.  Pt signed up with telephonic Quitline, agreeing for [place] [facility] to call her between 9pm and midnight.  [name] informed will have [facility] follow up with insurance as to which NRT patches are covered.  Pt also states that her [place] food stamps were stopped and pt will need to reapply.  [name] has been ill lately has yet to go and reapply for food stamps.  CSW provided [name] will bag of items from [employer] pantry, pantry/hot meal locations and food stamp application left with Diabetes Educator as pt has a 1330 appt with Diabetes Educator.  CSW discussed case with [facility] care manager, [facility] will look into supplying [name] with a scale for obesity, NRT patches covered under insurance and follow up with pt on Food Stamp application.  CSW faxed [facility] referral.

## 2013-03-01 NOTE — Patient Instructions (Signed)
HAPPY BIRTHDAY!!!!  Self Care Goals= to call [place] if you are having trouble with quitting smoking OR buying medicines or eating (healthy).

## 2013-03-01 NOTE — Assessment & Plan Note (Signed)
She is not walking much 2/2 feet ulcers so cannot assess sxs.

## 2013-03-01 NOTE — Assessment & Plan Note (Signed)
She has been on the Lexapro for yrs and states is not working. She desires to quit tobacco so will change to wellbutrin which will also have less negative on weight. She will F/U 2 months.

## 2013-03-01 NOTE — Progress Notes (Signed)
  Subjective:    [name], female    DOB: 1961-05-19, 52 y.o.   MRN: [id number]  HPI  This is my first visit with Jennifer [name]. Please see the A&P for the status of the pt's chronic medical problems.   Review of Systems  Constitutional: Positive for activity change and unexpected weight change. Negative for appetite change.  Respiratory: Positive for cough and shortness of breath.   Cardiovascular: Negative for chest pain.  Gastrointestinal: Negative for abdominal distention.  Musculoskeletal: Positive for myalgias, back pain, arthralgias and gait problem.  Skin: Positive for wound.  Psychiatric/Behavioral: Positive for sleep disturbance and dysphoric mood.       Objective:   Physical Exam  Constitutional: She is oriented to person, place, and time. She appears well-developed and well-nourished. No distress.  HENT:  Head: Normocephalic and atraumatic.  Right Ear: External ear normal.  Left Ear: External ear normal.  Nose: Nose normal.  Eyes: Conjunctivae and EOM are normal. Right eye exhibits no discharge. Left eye exhibits no discharge. No scleral icterus.  Cardiovascular: Normal rate, regular rhythm and normal heart sounds.   Pulmonary/Chest: Effort normal and breath sounds normal. No respiratory distress.  Abdominal: Soft. Bowel sounds are normal.  Neurological: She is alert and oriented to person, place, and time.  Skin: Skin is warm and dry. She is not diaphoretic.  In lower ABD fold there is a 4 mm excoriated area. No drainage. No erythema. No yeast. No odor. Well healing.  Psychiatric: She has a normal mood and affect. Her behavior is normal. Judgment and thought content normal.          Assessment & Plan:

## 2013-03-01 NOTE — Patient Instructions (Addendum)
General Instructions: 1. Stop the lexapro 2. Start the wellbutrin - for depression and to help stop smoking 3. I will ask [facility] if they can help with a scale 4. I will send test strips to test 4 times a day 5. l See me in 2 months.   Treatment Goals:  Goals (1 Years of Data) as of 03/01/13         As of Today 02/23/13 02/16/13 02/01/13 02/01/13     Blood Pressure    . Blood Pressure < 140/90  149/80 183/89 148/83 163/70 142/72     Lifestyle    . Quit smoking / using tobacco           Result Component    . HEMOGLOBIN A1C < 7.5          . LDL CALC < 100            Progress Toward Treatment Goals:  Treatment Goal 03/01/2013  Hemoglobin A1C deteriorated  Blood pressure deteriorated  Stop smoking stopped smoking  Other  unable to assess    Self Care Goals & Plans:  Self Care Goal 03/01/2013  Manage my medications take my medicines as prescribed; refill my medications on time  Monitor my health bring my glucose meter and log to each visit; keep track of my blood glucose  Eat healthy foods eat more vegetables  Be physically active (No Data)  Stop smoking (No Data)    Home Blood Glucose Monitoring 03/01/2013  Check my blood sugar 4 times a day  When to check my blood sugar before breakfast; before lunch; before dinner; at bedtime     Care Management & Community Referrals:  Referral 03/01/2013  Referrals made for care management support diabetes educator; smoking cessation counselor; Child psychotherapist

## 2013-03-01 NOTE — Progress Notes (Signed)
Medical Nutrition Therapy:  Appt start time: 1345 end time:  1430.  Assessment:  Primary concerns today: Blood sugar control.  Patient did not want to discuss food intake except to say that she is experiencing food insecurity. Reports using on average 200 units of insulin a day. Blood sugars reviewed.  Patient reports problems sleeping and staying asleep. Sleep screening done and results show patient is high risk Recent physical activity includes limited by foot wounds.  Progress Towards Goal(s):  In progress.   Nutritional Diagnosis:  NB-3.2 Limited access to food or water As related to  lack of resources to purchase foods.  As evidenced by patient report. NB-1.3 Not ready for diet/lifestyle change as related to competing values ( quitting smoking) as evidenced by lack of desire to discuss food intake today.     Intervention:  Nutrition education about need for improved blood sugars and good nutation to improve changes for wound healing.   Monitoring/Evaluation:  Dietary intake, exercise, blood sugars, and body weight in 1 month(s). Coordination of care- discuss sleep evaluation with primary care physician.

## 2013-03-02 ENCOUNTER — Telehealth: Payer: Self-pay | Admitting: *Deleted

## 2013-03-02 ENCOUNTER — Other Ambulatory Visit: Payer: Self-pay | Admitting: Internal Medicine

## 2013-03-02 ENCOUNTER — Encounter: Payer: Self-pay | Admitting: Infectious Disease

## 2013-03-02 ENCOUNTER — Encounter: Payer: Self-pay | Admitting: Internal Medicine

## 2013-03-02 DIAGNOSIS — E669 Obesity, unspecified: Secondary | ICD-10-CM | POA: Insufficient documentation

## 2013-03-02 DIAGNOSIS — E66812 Obesity, class 2: Secondary | ICD-10-CM | POA: Insufficient documentation

## 2013-03-02 DIAGNOSIS — E785 Hyperlipidemia, unspecified: Secondary | ICD-10-CM

## 2013-03-02 NOTE — Assessment & Plan Note (Signed)
[facility] will provide pt with a scale so that she can monitor her weight at home.

## 2013-03-02 NOTE — Assessment & Plan Note (Addendum)
Lungs are clear today. She is on nebs and MDI's. Will see if can find PFT's.  Wants to quit tobacco. She understands the risks. Her granddaughter wants her to quit. She states her last cig was this AM. Gave info, referred to [facility], and Rx'd Wellbutrin and nicotine patches.   Assessment:  Progress toward smoking cessation:  stopped smoking  Barriers to progress toward smoking cessation:     Comments: See above  Plan:  Instruction/counseling given:  I counseled patient on the dangers of tobacco use, advised patient to stop smoking and reviewed strategies to maximize success.  Educational resources provided:  [place] [employer]) brochure  Self management tools provided:  smoking cessation plan ([facility] Quit Plan)  Medications to assist with smoking cessation:  Nicotine Patch & welbutrin Patient agreed to the following self-care plans for smoking cessation:   (PATIENT QUIT SMOKING TODAY)   Other: See above

## 2013-03-02 NOTE — Telephone Encounter (Signed)
Patient called and advised that her PICC site has been red for a few days but now it is red, swollen and painful. She advised it hurts her to move her arm and she is not to see her at home nurse until Monday 03/07/13 asked what to do. I advised her she will need to be seen and gave her an appt with Dr Daiva Eves for 03/03/13 at 11 am as he had an open slot and we need to see the site.

## 2013-03-02 NOTE — Progress Notes (Signed)
[facility] called and fasting lipid panel will be drawn Wed 03/09/13 at pt's next home visit. Dr Rogelia Boga informed and order faxed to [phone].

## 2013-03-02 NOTE — Assessment & Plan Note (Addendum)
The excoriated area in ABD needs nothing other than local care that she is already doing. Is not candidia.   She stated she lost food stamps and needs assistance with food. Referral to [facility] who has already responded back to me.

## 2013-03-02 NOTE — Assessment & Plan Note (Signed)
Still with PICC and [facility] comes Monday to assess it. Pt infuses ABX herself. F/U [facility].

## 2013-03-02 NOTE — Assessment & Plan Note (Signed)
She is on Lyrica and oxycodone. She has pain in feet, legs, and back. Only the oxy helps her back. She takes 1 or 2 pills every 8 hours so 3 to 6 per day. She can never go a day without at least three. She has never had trouble with controlled meds before and her meds are safe.

## 2013-03-02 NOTE — Assessment & Plan Note (Signed)
I need to check her statin next appt since last LDL was 109 in July.

## 2013-03-03 ENCOUNTER — Encounter: Payer: Self-pay | Admitting: Infectious Disease

## 2013-03-03 ENCOUNTER — Ambulatory Visit (INDEPENDENT_AMBULATORY_CARE_PROVIDER_SITE_OTHER): Payer: Medicare Other | Admitting: Infectious Disease

## 2013-03-03 ENCOUNTER — Ambulatory Visit (HOSPITAL_COMMUNITY)
Admission: RE | Admit: 2013-03-03 | Discharge: 2013-03-03 | Disposition: A | Discharge status: Home / Self Care | Payer: Medicare Other | Source: Ambulatory Visit | Attending: Infectious Disease | Admitting: Infectious Disease

## 2013-03-03 ENCOUNTER — Other Ambulatory Visit: Payer: Self-pay | Admitting: Infectious Disease

## 2013-03-03 ENCOUNTER — Other Ambulatory Visit: Payer: Self-pay | Admitting: Dietician

## 2013-03-03 ENCOUNTER — Telehealth: Payer: Self-pay | Admitting: *Deleted

## 2013-03-03 VITALS — BP 141/80 | HR 61 | Temp 98.1°F

## 2013-03-03 DIAGNOSIS — E669 Obesity, unspecified: Secondary | ICD-10-CM | POA: Insufficient documentation

## 2013-03-03 DIAGNOSIS — A491 Streptococcal infection, unspecified site: Secondary | ICD-10-CM

## 2013-03-03 DIAGNOSIS — B951 Streptococcus, group B, as the cause of diseases classified elsewhere: Secondary | ICD-10-CM

## 2013-03-03 DIAGNOSIS — E1169 Type 2 diabetes mellitus with other specified complication: Secondary | ICD-10-CM

## 2013-03-03 DIAGNOSIS — M7989 Other specified soft tissue disorders: Secondary | ICD-10-CM

## 2013-03-03 DIAGNOSIS — R7881 Bacteremia: Secondary | ICD-10-CM

## 2013-03-03 DIAGNOSIS — Z452 Encounter for adjustment and management of vascular access device: Secondary | ICD-10-CM | POA: Insufficient documentation

## 2013-03-03 DIAGNOSIS — M79601 Pain in right arm: Secondary | ICD-10-CM

## 2013-03-03 DIAGNOSIS — M79609 Pain in unspecified limb: Secondary | ICD-10-CM

## 2013-03-03 DIAGNOSIS — M869 Osteomyelitis, unspecified: Secondary | ICD-10-CM

## 2013-03-03 DIAGNOSIS — A4901 Methicillin susceptible Staphylococcus aureus infection, unspecified site: Secondary | ICD-10-CM

## 2013-03-03 DIAGNOSIS — A419 Sepsis, unspecified organism: Secondary | ICD-10-CM | POA: Insufficient documentation

## 2013-03-03 DIAGNOSIS — T82898A Other specified complication of vascular prosthetic devices, implants and grafts, initial encounter: Secondary | ICD-10-CM

## 2013-03-03 DIAGNOSIS — M9080 Osteopathy in diseases classified elsewhere, unspecified site: Secondary | ICD-10-CM

## 2013-03-03 DIAGNOSIS — T82838A Hemorrhage of vascular prosthetic devices, implants and grafts, initial encounter: Secondary | ICD-10-CM

## 2013-03-03 DIAGNOSIS — I38 Endocarditis, valve unspecified: Secondary | ICD-10-CM

## 2013-03-03 DIAGNOSIS — E1159 Type 2 diabetes mellitus with other circulatory complications: Secondary | ICD-10-CM

## 2013-03-03 DIAGNOSIS — B9561 Methicillin susceptible Staphylococcus aureus infection as the cause of diseases classified elsewhere: Secondary | ICD-10-CM

## 2013-03-03 MED ORDER — GLUCOSE BLOOD VI STRP: ORAL_STRIP | Status: DC

## 2013-03-03 NOTE — Telephone Encounter (Signed)
Verbal order given per Dr. Daiva Eves to [place], RN at [facility] to extend IV antibiotics for another 6 weeks, pending culture results. Jennifer [name]

## 2013-03-03 NOTE — Progress Notes (Addendum)
Right:  No evidence of DVT.  There appears to be superficial thrombus in the right basilic vein and partial thrombus in the cephalic vein.

## 2013-03-03 NOTE — Progress Notes (Signed)
Subjective:    [name], female    DOB: 1961/05/30, 52 y.o.   MRN: [id number]  HPI   52 year old African American lady with diabetes peripheral neuropathy and chronic foot ulcers who was admitted to my service earlier this month with polymicrobial bacteremia with MSSA/GBS in 2/2 cultures and discolored feet, worsening of her ulcers. She had MRI which showed possible chronic osteo 4th phalanx, no abscess. Dr. Lajoyce Corners saw pt and performed bedside debridement of tissue of right foot. TEE showed MV vegetation with possible  tertiary chordae rupture and TV vegetations . Patient was seen by Dr Donata Clay with [facility] as well. Patient cleared his bacteremia by the 6th of February and was narrowed to IV ancef 2g IV q 8 hours with plans for at least 6 weeks.  I saw her roughly 2 weeks ago but she is back again now with erythema pain in left arm and bleeding from PICC site.  She is without fevers, chills malaise. I did not examine her feet as I was anxious to get her PICC pulled on the right, new one inserted on the left and US done to rule out DVT and availability of testing and space was dependent on time.  WE did however I spend greater than 45 minutes with the patients care  including greater than 50% of time in face to face counsel of the patient and in coordination of their care.    Review of Systems  Constitutional: Negative for fever, chills, diaphoresis, activity change, appetite change, fatigue and unexpected weight change.  HENT: Negative for congestion, sore throat, rhinorrhea, sneezing, trouble swallowing and sinus pressure.   Eyes: Negative for photophobia and visual disturbance.  Respiratory: Negative for cough, chest tightness, shortness of breath, wheezing and stridor.   Cardiovascular: Negative for chest pain, palpitations and leg swelling.  Gastrointestinal: Negative for nausea, vomiting, abdominal pain, diarrhea, constipation, blood in stool, abdominal distention and  anal bleeding.  Genitourinary: Negative for dysuria, hematuria, flank pain and difficulty urinating.  Musculoskeletal: Positive for myalgias. Negative for back pain, joint swelling, arthralgias and gait problem.  Skin: Positive for wound. Negative for color change, pallor and rash.  Neurological: Negative for dizziness, tremors, weakness and light-headedness.  Hematological: Negative for adenopathy. Does not bruise/bleed easily.  Psychiatric/Behavioral: Negative for behavioral problems, confusion, sleep disturbance, dysphoric mood, decreased concentration and agitation.       Objective:   Physical Exam  Constitutional: She is oriented to person, place, and time. She appears well-developed and well-nourished. No distress.  HENT:  Head: Normocephalic and atraumatic.  Mouth/Throat: Oropharynx is clear and moist. No oropharyngeal exudate.  Eyes: Conjunctivae and EOM are normal. Pupils are equal, round, and reactive to light. No scleral icterus.  Neck: Normal range of motion. Neck supple. No JVD present.  Cardiovascular: Normal rate, regular rhythm and normal heart sounds.  Exam reveals no gallop and no friction rub.   No murmur heard. Pulmonary/Chest: Effort normal and breath sounds normal. No respiratory distress. She has no wheezes. She has no rales. She exhibits no tenderness.  Abdominal: She exhibits no distension and no mass. There is no tenderness. There is no rebound and no guarding.  Musculoskeletal: She exhibits no edema and no tenderness.  Lymphadenopathy:    She has no cervical adenopathy.  Neurological: She is alert and oriented to person, place, and time. She has normal reflexes. She exhibits normal muscle tone. Coordination normal.  Skin: Skin is warm and dry. She is not diaphoretic.  Psychiatric: She has a normal mood and affect. Her behavior is normal. Judgment and thought content normal.          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 Donata Clay --source was undoubtedly feet  Chronic osteomyelitis and foot ulcers: --fu with Dr. Lajoyce Corners --as above prolonged abx, consideration for oral abx post IV ---recheck inflammatory markers at end of the month  Picc line irritation:: remove the PICC, Doppler to rule out DVT  ADDENDUM: Doppler negative for DVT but pos for supperfical basilic vein clot. Will have her apply  Warm compresses to right arm and watch it,

## 2013-03-04 NOTE — Telephone Encounter (Signed)
We will be able to shorten this. She just needs 6 weeks total with abx ending in another 2 weeks or so. She should be seeng me just before stopping

## 2013-03-07 ENCOUNTER — Ambulatory Visit: Payer: Medicare Other | Admitting: Infectious Disease

## 2013-03-08 ENCOUNTER — Telehealth: Payer: Self-pay | Admitting: *Deleted

## 2013-03-08 ENCOUNTER — Other Ambulatory Visit: Payer: Self-pay | Admitting: Internal Medicine

## 2013-03-08 NOTE — Telephone Encounter (Signed)
When I did my med rec with at last visit a week or two ago she was not taking. What has changed since then?

## 2013-03-08 NOTE — Telephone Encounter (Signed)
Thanks

## 2013-03-09 ENCOUNTER — Encounter: Payer: Self-pay | Admitting: Infectious Disease

## 2013-03-09 ENCOUNTER — Encounter (HOSPITAL_COMMUNITY): Payer: Self-pay | Admitting: Internal Medicine

## 2013-03-09 ENCOUNTER — Inpatient Hospital Stay (HOSPITAL_COMMUNITY): Payer: Medicare Other

## 2013-03-09 ENCOUNTER — Inpatient Hospital Stay (HOSPITAL_COMMUNITY)
Admission: AD | Admit: 2013-03-09 | Discharge: 2013-03-10 | DRG: 637 | Disposition: A | Discharge status: Home Health | Payer: Medicare Other | Source: Ambulatory Visit | Attending: Internal Medicine | Admitting: Internal Medicine

## 2013-03-09 ENCOUNTER — Ambulatory Visit (INDEPENDENT_AMBULATORY_CARE_PROVIDER_SITE_OTHER): Payer: Medicare Other | Admitting: Infectious Disease

## 2013-03-09 VITALS — BP 131/81 | HR 62 | Temp 98.3°F

## 2013-03-09 DIAGNOSIS — Z6841 Body Mass Index (BMI) 40.0 and over, adult: Secondary | ICD-10-CM

## 2013-03-09 DIAGNOSIS — L98499 Non-pressure chronic ulcer of skin of other sites with unspecified severity: Secondary | ICD-10-CM

## 2013-03-09 DIAGNOSIS — Z720 Tobacco use: Secondary | ICD-10-CM | POA: Diagnosis present

## 2013-03-09 DIAGNOSIS — I82619 Acute embolism and thrombosis of superficial veins of unspecified upper extremity: Secondary | ICD-10-CM | POA: Diagnosis present

## 2013-03-09 DIAGNOSIS — Z89512 Acquired absence of left leg below knee: Secondary | ICD-10-CM | POA: Diagnosis present

## 2013-03-09 DIAGNOSIS — B951 Streptococcus, group B, as the cause of diseases classified elsewhere: Secondary | ICD-10-CM

## 2013-03-09 DIAGNOSIS — E669 Obesity, unspecified: Secondary | ICD-10-CM | POA: Diagnosis present

## 2013-03-09 DIAGNOSIS — L97509 Non-pressure chronic ulcer of other part of unspecified foot with unspecified severity: Secondary | ICD-10-CM

## 2013-03-09 DIAGNOSIS — Z89519 Acquired absence of unspecified leg below knee: Secondary | ICD-10-CM | POA: Diagnosis present

## 2013-03-09 DIAGNOSIS — I82611 Acute embolism and thrombosis of superficial veins of right upper extremity: Secondary | ICD-10-CM

## 2013-03-09 DIAGNOSIS — J4489 Other specified chronic obstructive pulmonary disease: Secondary | ICD-10-CM | POA: Diagnosis present

## 2013-03-09 DIAGNOSIS — E785 Hyperlipidemia, unspecified: Secondary | ICD-10-CM | POA: Diagnosis present

## 2013-03-09 DIAGNOSIS — Z89511 Acquired absence of right leg below knee: Secondary | ICD-10-CM | POA: Diagnosis present

## 2013-03-09 DIAGNOSIS — F17200 Nicotine dependence, unspecified, uncomplicated: Secondary | ICD-10-CM | POA: Diagnosis present

## 2013-03-09 DIAGNOSIS — E1149 Type 2 diabetes mellitus with other diabetic neurological complication: Secondary | ICD-10-CM | POA: Diagnosis present

## 2013-03-09 DIAGNOSIS — I10 Essential (primary) hypertension: Secondary | ICD-10-CM | POA: Diagnosis present

## 2013-03-09 DIAGNOSIS — I511 Rupture of chordae tendineae, not elsewhere classified: Secondary | ICD-10-CM

## 2013-03-09 DIAGNOSIS — E1169 Type 2 diabetes mellitus with other specified complication: Secondary | ICD-10-CM

## 2013-03-09 DIAGNOSIS — M869 Osteomyelitis, unspecified: Secondary | ICD-10-CM

## 2013-03-09 DIAGNOSIS — IMO0001 Reserved for inherently not codable concepts without codable children: Secondary | ICD-10-CM | POA: Diagnosis present

## 2013-03-09 DIAGNOSIS — I82890 Acute embolism and thrombosis of other specified veins: Secondary | ICD-10-CM

## 2013-03-09 DIAGNOSIS — A4901 Methicillin susceptible Staphylococcus aureus infection, unspecified site: Secondary | ICD-10-CM | POA: Diagnosis present

## 2013-03-09 DIAGNOSIS — IMO0002 Reserved for concepts with insufficient information to code with codable children: Principal | ICD-10-CM | POA: Diagnosis present

## 2013-03-09 DIAGNOSIS — J449 Chronic obstructive pulmonary disease, unspecified: Secondary | ICD-10-CM | POA: Diagnosis present

## 2013-03-09 DIAGNOSIS — A4101 Sepsis due to Methicillin susceptible Staphylococcus aureus: Secondary | ICD-10-CM

## 2013-03-09 DIAGNOSIS — I739 Peripheral vascular disease, unspecified: Secondary | ICD-10-CM

## 2013-03-09 DIAGNOSIS — I872 Venous insufficiency (chronic) (peripheral): Secondary | ICD-10-CM | POA: Diagnosis present

## 2013-03-09 DIAGNOSIS — I330 Acute and subacute infective endocarditis: Secondary | ICD-10-CM

## 2013-03-09 DIAGNOSIS — F1120 Opioid dependence, uncomplicated: Secondary | ICD-10-CM | POA: Diagnosis present

## 2013-03-09 DIAGNOSIS — R7881 Bacteremia: Secondary | ICD-10-CM

## 2013-03-09 DIAGNOSIS — E1142 Type 2 diabetes mellitus with diabetic polyneuropathy: Secondary | ICD-10-CM | POA: Diagnosis present

## 2013-03-09 DIAGNOSIS — E118 Type 2 diabetes mellitus with unspecified complications: Secondary | ICD-10-CM | POA: Diagnosis present

## 2013-03-09 DIAGNOSIS — F324 Major depressive disorder, single episode, in partial remission: Secondary | ICD-10-CM | POA: Diagnosis present

## 2013-03-09 DIAGNOSIS — Z9889 Other specified postprocedural states: Secondary | ICD-10-CM | POA: Diagnosis present

## 2013-03-09 DIAGNOSIS — F329 Major depressive disorder, single episode, unspecified: Secondary | ICD-10-CM | POA: Diagnosis present

## 2013-03-09 DIAGNOSIS — Z79899 Other long term (current) drug therapy: Secondary | ICD-10-CM

## 2013-03-09 DIAGNOSIS — J441 Chronic obstructive pulmonary disease with (acute) exacerbation: Secondary | ICD-10-CM | POA: Diagnosis present

## 2013-03-09 DIAGNOSIS — K219 Gastro-esophageal reflux disease without esophagitis: Secondary | ICD-10-CM | POA: Diagnosis present

## 2013-03-09 DIAGNOSIS — E119 Type 2 diabetes mellitus without complications: Secondary | ICD-10-CM | POA: Diagnosis present

## 2013-03-09 DIAGNOSIS — M79609 Pain in unspecified limb: Secondary | ICD-10-CM

## 2013-03-09 DIAGNOSIS — I798 Other disorders of arteries, arterioles and capillaries in diseases classified elsewhere: Secondary | ICD-10-CM | POA: Diagnosis present

## 2013-03-09 DIAGNOSIS — F3289 Other specified depressive episodes: Secondary | ICD-10-CM | POA: Diagnosis present

## 2013-03-09 DIAGNOSIS — F332 Major depressive disorder, recurrent severe without psychotic features: Secondary | ICD-10-CM | POA: Diagnosis present

## 2013-03-09 DIAGNOSIS — I82629 Acute embolism and thrombosis of deep veins of unspecified upper extremity: Secondary | ICD-10-CM | POA: Diagnosis present

## 2013-03-09 DIAGNOSIS — E1351 Other specified diabetes mellitus with diabetic peripheral angiopathy without gangrene: Secondary | ICD-10-CM | POA: Diagnosis present

## 2013-03-09 DIAGNOSIS — Z79891 Long term (current) use of opiate analgesic: Secondary | ICD-10-CM | POA: Diagnosis present

## 2013-03-09 DIAGNOSIS — Z860100 Personal history of colon polyps, unspecified: Secondary | ICD-10-CM

## 2013-03-09 DIAGNOSIS — A491 Streptococcal infection, unspecified site: Secondary | ICD-10-CM

## 2013-03-09 DIAGNOSIS — Z7982 Long term (current) use of aspirin: Secondary | ICD-10-CM

## 2013-03-09 DIAGNOSIS — E11621 Type 2 diabetes mellitus with foot ulcer: Secondary | ICD-10-CM | POA: Diagnosis present

## 2013-03-09 DIAGNOSIS — I5032 Chronic diastolic (congestive) heart failure: Secondary | ICD-10-CM | POA: Diagnosis present

## 2013-03-09 DIAGNOSIS — E1165 Type 2 diabetes mellitus with hyperglycemia: Secondary | ICD-10-CM | POA: Diagnosis present

## 2013-03-09 DIAGNOSIS — Z7902 Long term (current) use of antithrombotics/antiplatelets: Secondary | ICD-10-CM

## 2013-03-09 DIAGNOSIS — Z794 Long term (current) use of insulin: Secondary | ICD-10-CM

## 2013-03-09 DIAGNOSIS — E1159 Type 2 diabetes mellitus with other circulatory complications: Secondary | ICD-10-CM

## 2013-03-09 HISTORY — DX: Rupture of chordae tendineae, not elsewhere classified: I51.1

## 2013-03-09 HISTORY — DX: Chronic diastolic (congestive) heart failure: I50.32

## 2013-03-09 HISTORY — DX: Other chronic osteomyelitis, unspecified ankle and foot: M86.679

## 2013-03-09 HISTORY — DX: Non-pressure chronic ulcer of other part of unspecified foot with unspecified severity: L97.509

## 2013-03-09 HISTORY — DX: Bacterial infection, unspecified: A49.9

## 2013-03-09 HISTORY — DX: Tobacco use: Z72.0

## 2013-03-09 LAB — GLUCOSE, CAPILLARY
Glucose-Capillary: 173 mg/dL — ABNORMAL HIGH (ref 70–99)
Glucose-Capillary: 305 mg/dL — ABNORMAL HIGH (ref 70–99)
Glucose-Capillary: 236 mg/dL — ABNORMAL HIGH (ref 70–99)
Glucose-Capillary: 213 mg/dL — ABNORMAL HIGH (ref 70–99)

## 2013-03-09 LAB — COMPREHENSIVE METABOLIC PANEL
ALT: 10 U/L (ref 0–35)
AST: 65 U/L — ABNORMAL HIGH (ref 0–37)
Albumin: 3.2 g/dL — ABNORMAL LOW (ref 3.5–5.2)
Alkaline Phosphatase: 81 U/L (ref 39–117)
BUN: 11 mg/dL (ref 6–23)
CO2: 28 mEq/L (ref 19–32)
Calcium: 9 mg/dL (ref 8.4–10.5)
Chloride: 97 mEq/L (ref 96–112)
Creatinine, Ser: 0.62 mg/dL (ref 0.50–1.10)
GFR calc Af Amer: >90 mL/min (ref >90)
GFR calc non Af Amer: >90 mL/min (ref >90)
Glucose, Bld: 197 mg/dL — ABNORMAL HIGH (ref 70–99)
Potassium: 3.8 mEq/L (ref 3.5–5.1)
Sodium: 136 mEq/L (ref 135–145)
Total Bilirubin: 0.2 mg/dL — ABNORMAL LOW (ref 0.3–1.2)
Total Protein: 7.1 g/dL (ref 6.0–8.3)

## 2013-03-09 LAB — CBC WITH DIFFERENTIAL/PLATELET
Basophils Absolute: 0.1 10*3/uL (ref 0.0–0.1)
Basophils Relative: 1 % (ref 0–1)
Eosinophils Absolute: 0.8 10*3/uL — ABNORMAL HIGH (ref 0.0–0.7)
Eosinophils Relative: 10 % — ABNORMAL HIGH (ref 0–5)
HCT: 37.2 % (ref 36.0–46.0)
Hemoglobin: 12 g/dL (ref 12.0–15.0)
Lymphocytes Relative: 33 % (ref 12–46)
Lymphs Abs: 2.7 10*3/uL (ref 0.7–4.0)
MCH: 31.7 pg (ref 26.0–34.0)
MCHC: 32.3 g/dL (ref 30.0–36.0)
MCV: 98.2 fL (ref 78.0–100.0)
Monocytes Absolute: 0.5 10*3/uL (ref 0.1–1.0)
Monocytes Relative: 7 % (ref 3–12)
Neutro Abs: 4 10*3/uL (ref 1.7–7.7)
Neutrophils Relative %: 50 % (ref 43–77)
Platelets: 223 10*3/uL (ref 150–400)
RBC: 3.79 MIL/uL — ABNORMAL LOW (ref 3.87–5.11)
RDW: 14.5 % (ref 11.5–15.5)
WBC: 8 10*3/uL (ref 4.0–10.5)

## 2013-03-09 LAB — PROTIME-INR
INR: 1.24 (ref 0.00–1.49)
Prothrombin Time: 15.4 seconds — ABNORMAL HIGH (ref 11.6–15.2)

## 2013-03-09 LAB — LIPASE, BLOOD: Lipase: 26 U/L (ref 11–59)

## 2013-03-09 LAB — TROPONIN I: Troponin I: <0.3 ng/mL (ref <0.30)

## 2013-03-09 MED ORDER — SODIUM CHLORIDE 0.9 % IV SOLN
INTRAVENOUS | Status: DC
  Administered 2013-03-09: 20:00:00 via INTRAVENOUS

## 2013-03-09 MED ORDER — FLUTICASONE PROPIONATE 50 MCG/ACT NA SUSP
1.0000 | Freq: Two times a day (BID) | NASAL | Status: DC
  Administered 2013-03-09 (×2): 1 via NASAL
  Filled 2013-03-09: qty 16

## 2013-03-09 MED ORDER — SIMVASTATIN 20 MG PO TABS
20.0000 mg | ORAL_TABLET | Freq: Every day | ORAL | Status: DC
  Administered 2013-03-09: 20 mg via ORAL
  Filled 2013-03-09 (×2): qty 1

## 2013-03-09 MED ORDER — INSULIN ASPART 100 UNIT/ML ~~LOC~~ SOLN
0.0000 [IU] | Freq: Three times a day (TID) | SUBCUTANEOUS | Status: DC
  Administered 2013-03-10 (×3): 2 [IU] via SUBCUTANEOUS

## 2013-03-09 MED ORDER — ENOXAPARIN SODIUM 40 MG/0.4ML ~~LOC~~ SOLN
40.0000 mg | SUBCUTANEOUS | Status: DC
  Administered 2013-03-09 – 2013-03-10 (×2): 40 mg via SUBCUTANEOUS
  Filled 2013-03-09 (×2): qty 0.4

## 2013-03-09 MED ORDER — ALBUTEROL SULFATE HFA 108 (90 BASE) MCG/ACT IN AERS
2.0000 | INHALATION_SPRAY | Freq: Four times a day (QID) | RESPIRATORY_TRACT | Status: DC | PRN
  Filled 2013-03-09: qty 6.7

## 2013-03-09 MED ORDER — MOMETASONE FURO-FORMOTEROL FUM 100-5 MCG/ACT IN AERO
2.0000 | INHALATION_SPRAY | Freq: Two times a day (BID) | RESPIRATORY_TRACT | Status: DC
  Administered 2013-03-09 – 2013-03-10 (×3): 2 via RESPIRATORY_TRACT
  Filled 2013-03-09: qty 8.8

## 2013-03-09 MED ORDER — CEFAZOLIN SODIUM-DEXTROSE 2-3 GM-% IV SOLR
2.0000 g | Freq: Three times a day (TID) | INTRAVENOUS | Status: DC
  Administered 2013-03-09 – 2013-03-10 (×3): 2 g via INTRAVENOUS
  Filled 2013-03-09 (×4): qty 50

## 2013-03-09 MED ORDER — SODIUM CHLORIDE 0.9 % IJ SOLN: 3.0000 mL | INTRAMUSCULAR | Status: DC | PRN

## 2013-03-09 MED ORDER — ATENOLOL 100 MG PO TABS
100.0000 mg | ORAL_TABLET | Freq: Every day | ORAL | Status: DC
  Administered 2013-03-10: 100 mg via ORAL
  Filled 2013-03-09: qty 1

## 2013-03-09 MED ORDER — PREGABALIN 75 MG PO CAPS
200.0000 mg | ORAL_CAPSULE | Freq: Three times a day (TID) | ORAL | Status: DC
  Administered 2013-03-09 – 2013-03-10 (×4): 200 mg via ORAL
  Filled 2013-03-09 (×4): qty 2

## 2013-03-09 MED ORDER — GADOBENATE DIMEGLUMINE 529 MG/ML IV SOLN
20.0000 mL | Freq: Once | INTRAVENOUS | Status: AC
  Administered 2013-03-09: 20 mL via INTRAVENOUS

## 2013-03-09 MED ORDER — ATENOLOL 100 MG PO TABS
100.0000 mg | ORAL_TABLET | Freq: Every day | ORAL | Status: DC
  Administered 2013-03-09: 100 mg via ORAL
  Filled 2013-03-09: qty 1

## 2013-03-09 MED ORDER — INSULIN ASPART PROT & ASPART (70-30 MIX) 100 UNIT/ML ~~LOC~~ SUSP: 80.0000 [IU] | Freq: Every day | SUBCUTANEOUS | Status: DC

## 2013-03-09 MED ORDER — SODIUM CHLORIDE 0.9 % IJ SOLN: 3.0000 mL | Freq: Two times a day (BID) | INTRAMUSCULAR | Status: DC

## 2013-03-09 MED ORDER — OXYCODONE-ACETAMINOPHEN 5-325 MG PO TABS
1.0000 | ORAL_TABLET | Freq: Three times a day (TID) | ORAL | Status: DC | PRN
  Administered 2013-03-09 – 2013-03-10 (×3): 1 via ORAL
  Filled 2013-03-09 (×4): qty 1

## 2013-03-09 MED ORDER — BUPROPION HCL ER (SR) 150 MG PO TB12: 150.0000 mg | ORAL_TABLET | Freq: Two times a day (BID) | ORAL | Status: DC

## 2013-03-09 MED ORDER — SODIUM CHLORIDE 0.9 % IV SOLN: 250.0000 mL | INTRAVENOUS | Status: DC | PRN

## 2013-03-09 MED ORDER — INSULIN ASPART PROT & ASPART (70-30 MIX) 100 UNIT/ML ~~LOC~~ SUSP
100.0000 [IU] | Freq: Every day | SUBCUTANEOUS | Status: DC
  Administered 2013-03-10: 100 [IU] via SUBCUTANEOUS

## 2013-03-09 MED ORDER — PANTOPRAZOLE SODIUM 40 MG PO TBEC
40.0000 mg | DELAYED_RELEASE_TABLET | Freq: Every day | ORAL | Status: DC
  Administered 2013-03-09 – 2013-03-10 (×2): 40 mg via ORAL
  Filled 2013-03-09 (×2): qty 1

## 2013-03-09 MED ORDER — INSULIN ASPART PROT & ASPART (70-30 MIX) 100 UNIT/ML ~~LOC~~ SUSP
50.0000 [IU] | Freq: Every day | SUBCUTANEOUS | Status: DC
  Administered 2013-03-09 – 2013-03-10 (×2): 50 [IU] via SUBCUTANEOUS
  Filled 2013-03-09: qty 10

## 2013-03-09 MED ORDER — ASPIRIN 81 MG PO CHEW
81.0000 mg | CHEWABLE_TABLET | Freq: Every day | ORAL | Status: DC
  Administered 2013-03-09 – 2013-03-10 (×2): 81 mg via ORAL
  Filled 2013-03-09 (×2): qty 1

## 2013-03-09 MED ORDER — BUPROPION HCL ER (SR) 150 MG PO TB12
150.0000 mg | ORAL_TABLET | Freq: Two times a day (BID) | ORAL | Status: DC
  Administered 2013-03-09 – 2013-03-10 (×3): 150 mg via ORAL
  Filled 2013-03-09 (×4): qty 1

## 2013-03-09 MED ORDER — CEFAZOLIN SODIUM-DEXTROSE 2-3 GM-% IV SOLR
2.0000 g | Freq: Three times a day (TID) | INTRAVENOUS | Status: DC
  Filled 2013-03-09 (×2): qty 50

## 2013-03-09 MED ORDER — CLOPIDOGREL BISULFATE 75 MG PO TABS
75.0000 mg | ORAL_TABLET | Freq: Every day | ORAL | Status: DC
  Administered 2013-03-09 – 2013-03-10 (×2): 75 mg via ORAL
  Filled 2013-03-09 (×2): qty 1

## 2013-03-09 MED ORDER — ASPIRIN 81 MG PO TABS: 81.0000 mg | ORAL_TABLET | Freq: Every day | ORAL | Status: DC

## 2013-03-09 MED ORDER — NICOTINE 21 MG/24HR TD PT24
21.0000 mg | MEDICATED_PATCH | Freq: Every day | TRANSDERMAL | Status: DC
  Administered 2013-03-09 – 2013-03-10 (×2): 21 mg via TRANSDERMAL
  Filled 2013-03-09 (×2): qty 1

## 2013-03-09 MED ORDER — INSULIN ASPART 100 UNIT/ML ~~LOC~~ SOLN: 0.0000 [IU] | SUBCUTANEOUS | Status: DC

## 2013-03-09 NOTE — Progress Notes (Signed)
Utilization Review Completed.   Darion Milewski, RN, BSN Nurse Case Manager  [phone]  

## 2013-03-09 NOTE — Plan of Care (Signed)
Problem: Phase I Progression Outcomes Goal: OOB as tolerated unless otherwise ordered Outcome: Completed/Met Date Met:  03/09/13 Pt oob ad lib Goal: Voiding-avoid urinary catheter unless indicated Outcome: Completed/Met Date Met:  03/09/13 Pt voiding adequate amts of urine, foley not indicated

## 2013-03-09 NOTE — Progress Notes (Signed)
Patient is currently active with [facility] Care Management for chronic disease management services.  Patient has been engaged by a [employer].  Our community based plan of care has focused on disease management of her smoking cessation and Diabetes Type 2; uses glucometer; takes medications as prescribed; checks feet daily; Current Diabetes related medical conditions are Eye problems, High blood pressure, High cholesterol; Year of diagnosis October 1993; does not take an aspirin a day; Sees Diabetes provider 4 or more times per year; Lowest CBG 66; Highest CBG 499; hypoglycemia frequency rarely; MD managing Diabetes Dr. Lonzo Cloud. Hyperlipidemia: taking meds as prescribed; participates in regular exercise routine; does not follow a low fat diet; does not maintain a healthy weight; takes an aspirin a day; does not take a statin; MD managing hyperlipidemia Berlinda Last; does not add fiber to diet.  Hypertension: does not adhere to HTN diet; takes medications as prescribed; does not do BP self monitoring; Other Hypertension History: Patient is not self monitoring at this time.  Patient will receive a post discharge transition of care call and will be evaluated for monthly home visits for assessments and disease process education.  Made inpatient Case Manager Ivonne Andrew RN aware that [facility] Care Management following. Of note, [facility] Care Management services does not replace or interfere with any services that are arranged by inpatient case management or social work.  For additional questions or referrals please contact Anibal Henderson BSN RN [facility] Liaison at [phone] does agree to purchase blood presure machine through [facility] care management to start self monitoring in future.

## 2013-03-09 NOTE — Addendum Note (Signed)
Addended by: Remus Blake on: 03/09/2013 10:21 AM   Modules accepted: Orders

## 2013-03-09 NOTE — Consult Note (Signed)
[facility] [place] CONSULT NOTE 03/09/2013 DOB: [id number] MRN : [id number]  CC: non healing Diabetic ulcers soles of both feet Referring Physician: Priscella Mann, DO  History of Present Illness: Jennifer [name] is a 52 y.o. female With PMHx of DM, HTN and PAD who has had Bilat iliac stents placed ( Right in 2008 - Dr Darrick Penna; left by IR 2005) She developed bilateral ulcers on the soles of both  insensate feet at the end of January. She has been seeing Dr Lajoyce Corners for debridement of these ulcers. Pt states she had purulent material coming from ulcers prior to last hosp in Feb. She denies night or rest pain or claudication symptoms. She does have pain in legs from spinal stenosis when she stand for extended period of time. She has no sensation in either foot. She denies previous non healing ulcers in feet.  ABI's 01/2013 were > 0.9 bilat.  She has HX of SBE  and was treated with IV ABX through PICC line in February of 2014.  Past Medical History  Diagnosis Date  . Depression   . GERD (gastroesophageal reflux disease)   . Hyperlipidemia   . Hypertension   . Glaucoma   . PVD (peripheral vascular disease) with claudication     Stents to bilateral common iliac arteries, on chronic plavix  . Tinnitus   . Diabetic peripheral neuropathy   . Fibromyalgia   . Hyperplastic colon polyp 12/2010    Per colonoscopy (12/2010) - Dr. Arlyce Dice  . Diverticulosis   . Asthma   . COPD 01/08/2007    PFT's 05/2007 : FEV1/FVC 82, FEV1 64% pred, FEF 25-75% 40% predicted, 16% improvement in FEV1 with bronchodilators.     . Type II diabetes mellitus with peripheral circulatory disorders, uncontrolled (250.72) DX: 1993    Insulin dep. Poor control. Complicated by diabetic foot ulcer and diabetic eye disease.    . Chronic pain syndrome 12/03/2011    Likely secondary to depression, "fibromyalgia", neuropathy, and obesity.    . Tobacco abuse   . Chronic osteomyelitis of foot     chronic,  right secondary to diabetic foot ulcers  . Polymicrobial bacterial infection 01/2013    GBS and S. aureus bacteremia // Source likely infected diabetic foot ulcer  . 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 Shortness of breath   . Chronic diastolic heart failure     grade 2 per 2D echocardiogram (01/2013)  . Ulcer of foot, chronic     Left. No OM per MRI (01/2013)    Past Surgical History  Procedure Laterality Date  . Abdominal hysterectomy  1997    secondary to uterine fibroids  . Tubal ligation    . Rotator cuff repair      bilateral  . Breast biopsy      multiple-benign per pt  . Wrist surgery      right  . Ganglion cyst excision      multiple  . Other surgical history      stents in lower ext  . Tee without cardioversion N/A 01/31/2013    Procedure: TRANSESOPHAGEAL ECHOCARDIOGRAM (TEE);  Surgeon: Pricilla Riffle, MD;  Location: [facility];  Service: Cardiovascular;  Laterality: N/A;  Rm [phone]  . Bladder surgery      bladder reconstruction surgery     ROS: [x]  Positive  [ ]  Denies  General: [ ]  Weight loss, [ ]  Fever, [ ]  chills Neurologic: [ ]  Dizziness, [ ]  Blackouts, [ ]  Seizure [ ]  Stroke, [ ]  "Mini stroke", [ ]  Slurred speech, [ ]  Temporary blindness; [ ]  weakness in arms or legs, [ ]  Hoarseness Cardiac: [ ]  Chest pain/pressure, [ ]  Shortness of breath at rest [x ] Shortness of breath with exertion, [ ]  Atrial fibrillation or irregular heartbeat Vascular: [x ] Pain in legs with walking, [ ]  Pain in legs at rest, [ ]  Pain in legs at night,  [x ] Non-healing ulcer, [ ]  Blood clot in vein/DVT,   Pulmonary: [ ]  Home oxygen, [ ]  Productive cough, [ ]  Coughing up blood, [ ]  Asthma,  [ ]  Wheezing Musculoskeletal:  [ ]  Arthritis, [ ]  Low back pain, [ ]  Joint pain Hematologic: [ ]  Easy Bruising, [ ]  Anemia; [ ]  Hepatitis Gastrointestinal: [ ]  Blood in stool, [ x]  Gastroesophageal Reflux/heartburn, [ ]  Trouble swallowing Urinary: [ ]  chronic Kidney disease, [ ]  on HD - [ ]  MWF or [ ]  TTHS, [ ]  Burning with urination, [ ]  Difficulty urinating Skin: [ ]  Rashes, [ ]  Wounds Psychological: [ ]  Anxiety, [ ]  Depression  Social History History  Substance Use Topics  . Smoking status: Current Every Day Smoker -- 1.50 packs/day for 40 years    Types: Cigarettes  . Smokeless tobacco: Current User    Types: Snuff     Comment: Half pack or less.  . Alcohol Use: No    Family History Family History  Problem Relation Age of Onset  . Diverticulosis Mother   . Diabetes Mother   . Hypertension Mother   . Congestive Heart Failure Mother   . Asthma Father     Allergies  Allergen Reactions  . Iohexol      Desc: IV CONTRAST CAUSE NEPHROPATHY IN 2007   . Morphine Sulfate Itching and Rash    Current Facility-Administered Medications  Medication Dose Route Frequency Provider Last Rate Last Dose  . 0.9 %  sodium chloride infusion  250 mL Intravenous PRN Jennifer S Kalia-Reynolds, DO      . albuterol (PROVENTIL HFA;VENTOLIN HFA) 108 (90 BASE) MCG/ACT inhaler 2 puff  2 puff Inhalation Q6H PRN Jennifer S Kalia-Reynolds, DO      . aspirin chewable tablet 81 mg  81 mg Oral Daily Farley Ly, MD   81 mg at 03/09/13 1349  . atenolol (TENORMIN) tablet 100 mg  100 mg Oral Daily Jennifer S Kalia-Reynolds, DO   100 mg at 03/09/13 1257  . buPROPion [facility] SR) 12 hr tablet 150 mg  150 mg Oral BID Jennifer S Kalia-Reynolds, DO   150 mg at 03/09/13 1257  . ceFAZolin (ANCEF) IVPB 2 g/50 mL premix  2 g Intravenous Q8H Lorretta Harp, MD      . clopidogrel (PLAVIX) tablet 75 mg  75 mg Oral Daily Jennifer S Kalia-Reynolds, DO   75 mg at 03/09/13 1256  . enoxaparin (LOVENOX) injection 40 mg  40 mg Subcutaneous Q24H Jennifer S Kalia-Reynolds, DO   40 mg at 03/09/13 1259  . fluticasone (FLONASE) 50 MCG/ACT nasal spray 1 spray  1 spray Each Nare BID Jennifer S Kalia-Reynolds, DO   1 spray  at 03/09/13 1301  . insulin aspart (novoLOG) injection 0-9 Units  0-9 Units Subcutaneous TID WC Jennifer S Kalia-Reynolds, DO      . [START ON 03/10/2013] insulin aspart protamine-insulin aspart (NOVOLOG 70/30) injection 100 Units  100  Units Subcutaneous Q breakfast Jennifer S Kalia-Reynolds, DO      . insulin aspart protamine-insulin aspart (NOVOLOG 70/30) injection 50 Units  50 Units Subcutaneous Q supper Jennifer S Kalia-Reynolds, DO      . mometasone-formoterol (DULERA) 100-5 MCG/ACT inhaler 2 puff  2 puff Inhalation BID Jennifer S Kalia-Reynolds, DO   2 puff at 03/09/13 1214  . nicotine (NICODERM CQ - dosed in mg/24 hours) patch 21 mg  21 mg Transdermal Daily Jennifer S Kalia-Reynolds, DO   21 mg at 03/09/13 1300  . pantoprazole (PROTONIX) EC tablet 40 mg  40 mg Oral Daily Jennifer S Kalia-Reynolds, DO   40 mg at 03/09/13 1258  . pregabalin (LYRICA) capsule 200 mg  200 mg Oral TID Jennifer S Kalia-Reynolds, DO   200 mg at 03/09/13 1258  . simvastatin (ZOCOR) tablet 20 mg  20 mg Oral q1800 Jennifer S Kalia-Reynolds, DO      . sodium chloride 0.9 % injection 3 mL  3 mL Intravenous Q12H Jennifer S Kalia-Reynolds, DO      . sodium chloride 0.9 % injection 3 mL  3 mL Intravenous Q12H Jennifer S Kalia-Reynolds, DO      . sodium chloride 0.9 % injection 3 mL  3 mL Intravenous PRN Jennifer S Kalia-Reynolds, DO         Imaging: No results found.  Significant Diagnostic Studies: CBC Lab Results  Component Value Date   WBC 8.0 03/09/2013   HGB 12.0 03/09/2013   HCT 37.2 03/09/2013   MCV 98.2 03/09/2013   PLT 223 03/09/2013    BMET    Component Value Date/Time   NA 136 03/09/2013 1105   K 3.8 03/09/2013 1105   CL 97 03/09/2013 1105   CO2 28 03/09/2013 1105   GLUCOSE 197* 03/09/2013 1105   BUN 11 03/09/2013 1105   CREATININE 0.62 03/09/2013 1105   CREATININE 0.77 05/26/2012 1626   CALCIUM 9.0 03/09/2013 1105   GFRNONAA >90 03/09/2013 1105   GFRAA >90 03/09/2013 1105    COAG Lab Results  Component Value Date   INR  1.24 03/09/2013   INR 1.1 10/21/2007   No results found for this basename: PTT     Physical Examination BP Readings from Last 3 Encounters:  03/09/13 107/78  03/09/13 131/81  03/03/13 141/80   Temp Readings from Last 3 Encounters:  03/09/13 97.8 F (36.6 C) Oral  03/09/13 98.3 F (36.8 C) Oral  03/03/13 98.1 F (36.7 C) Oral   SpO2 Readings from Last 3 Encounters:  03/09/13 94%  03/01/13 95%  02/23/13 94%   Pulse Readings from Last 3 Encounters:  03/09/13 62  03/09/13 62  03/03/13 61    General:  WDWN in NAD HENT: WNL Eyes: Pupils equal Pulmonary: normal non-labored breathing Cardiac: RRR, Abdomen: soft, NT, no masses Skin: dry on both feet Vascular Exam/Pulses:3+ DP palp on right ; 2+ DP on left Both feet are warm and have dry skin Ulcer with dry eschar bilat soles of feet 6x4 cm on right; 6x5 cm on left  Extremities no cellulitis;   Musculoskeletal: no muscle wasting or atrophy  Neurologic: A&O X 3; Appropriate Affect ;  SENSATION: none on both feet MOTOR FUNCTION: Pt has good and equal strength in all extremities Speech is fluent/normal  Non-Invasive Vascular Imaging: ABI's 01/28/13 +-----------------+--------+--------------+---------+ Location PressureBrachial indexWaveform  +-----------------+--------+--------------+---------+ Right dorsal ped Hg0.86 Triphasic +-----------------+--------+--------------+---------+ Right post tibial1100mm Hg0.93 Triphasic +-----------------+--------+--------------+---------+ Left dorsal ped Hg0.84 Biphasic  +-----------------+--------+--------------+---------+ Left post tibial Hg0.92  Triphasic +-----------------+--------+--------------+---------+  Carotid dopplers 01/2013 showed < 39% stenosis Bilat  ASSESSMENT/PLAN: CHASLYN EISEN is a 52 y.o. female with DM, Diabetic neuropathy with nonhealing ulcers on soles of both insensate feet. She has been having debridement by Dr.  Lajoyce Corners. She has no symptoms of claudication since bilat. Iliac stents - left in 2005 by IR and Right by Dr. Darrick Penna in 2008 Pt has palpable DP pulses bilat and good flow to both feet with ABI's >0.9 bilat. Pt was to see Dr. Lajoyce Corners tomorrow for further debridement of wounds. With normal flow to feet and palpable pulses, Pt should heal these wounds. Recommending returning to Dr. Lajoyce Corners for wound care of both feet.   Patient has an extensive ulceration on the plantar surface of both lower extremities which have been present for 6-8 weeks. She has adequate circulation to her lower extremities with palpable dorsalis pedis pulses bilaterally with satisfactory ABIs. She has been treated by Dr. Lajoyce Corners who is debrided her wound recently and she was to have seen him in the office tomorrow f  No further vascular evaluation needed-would reconsult Dr. Lajoyce Corners for further wound care

## 2013-03-09 NOTE — H&P (Signed)
Date: 03/09/2013                Patient Name:  Jennifer [name]  MRN: [id number]   DOB: November 05, 1961  Age / Sex: 52 y.o., female   PCP: Burns Spain, MD              Medical Service: Internal Medicine Teaching Service              Attending Physician: Dr. Meredith Pel    First Contact: Dr. Zada Girt Pager: [phone]  Second Contact: Dr. Clyde Lundborg Pager: [phone]            After Hours (After 5pm / weekends / holidays): First Contact Second Contact Pager: Pager: [phone]    Chief Complaint: Left foot lesion with drainage  History of Present Illness: Patient is a 52 y.o. female with a PMHx of uncontrolled DM2 with multiple complications including chronic right foot osteomyelitis and left foot ulcer, COPD with ongoing tobacco abuse, grade 2 diastolic heart failure, and recent prolonged admission in 01/2013 with GBS/S. Aureus bacteremia, infective endocarditis, and questionable tertiary chordae tendinae rupture. She is admitted to [facility] on transfer from the [facility]. It seems that over the past 3 weeks, the patient has noted new black painless raised lesion on the left lateral great toe, that has been stable in size since time of onset. As well, the skin is coming loose from the ulcer bed over this timeframe. As well, over the last 1 day, patient has noted increased malodorous discharge from the left plantar diabetic ulcer. She admits to some discharge from the right plantar ulcer, however feels that it is less than experienced on the left. She denies increased erythema, warmth, or pain of her feet (of note she is insensate bilaterally).  Without associated redness, warmth, swelling from prior. She did have 2 episodes of vomiting 2 days ago, without recurrence.  She has remained without fevers, chills, nausea, recurrent vomiting, increased fatigue, palpitations, chest pain. She is compliant with her 3 times a day Ancef (currently on day 41), and states that she has  not missed any doses. She has last been evaluated by Dr. Lajoyce Corners approximately one week ago, and he had recommended for daily cleansing of the wounds, and did not seem overtly concerned about progression or superimposed infection.  Of note, during last ID followup visit, the patient did complain of minimal bleeding and pain at her right upper extremity PICC line site. Doppler was performed at that time and was positive for only superficial thrombus. The PICC line was removed, and replaced in the left upper extremity. The patient has not noticed any increase in erythema, swelling, or pain surrounding the new PICC line site    Review of Systems: Constitutional:  denies fever, chills, diaphoresis, appetite change and fatigue.  HEENT: denies eye pain, redness, hearing loss, ear pain, congestion, sore throat, rhinorrhea.  Respiratory: denies SOB, DOE, cough, chest tightness, and wheezing.  Cardiovascular: denies chest pain, palpitations and leg swelling.  Gastrointestinal: admits to nausea, vomiting that has resolved. Denies abdominal pain, diarrhea, constipation, blood in stool.  Genitourinary: denies dysuria, urgency, frequency, hematuria, flank pain and difficulty urinating.  Musculoskeletal: admits to chronic muscle spasms in legs and stomach.  Denies myalgias, back pain, joint swelling, arthralgias.  Skin: admits to diabetic ulcers on feet. Denies new rashes  Neurological: admits to chronic paresthesias in BL hands - unchanged from baseline Denies dizziness, seizures, syncope, weakness, light-headedness, numbness and headaches.  Hematological: admits to easy bleeding from gums. Denies recent increased bruising, family bleeding history.  Psychiatric: admits to depression  Denies suicidal ideation/ homicidal ideation.    Current Outpatient Medications: Medication Sig  . ADVAIR DISKUS 250-50 MCG/DOSE AEPB INHALE 1 PUFF INTO THE LUNG TWICE A DAY  . albuterol (PROVENTIL HFA;VENTOLIN HFA) 108 (90  BASE) MCG/ACT inhaler Inhale 2 puffs into the lungs every 6 (six) hours as needed. For breathing  . aspirin 81 MG tablet Take 81 mg by mouth daily.    Marland Kitchen atenolol (TENORMIN) 100 MG tablet TAKE 1 TABLET (100 MG TOTAL) BY MOUTH DAILY.  . benazepril (LOTENSIN) 40 MG tablet TAKE 1 TABLET BY MOUTH EVERY DAY  . buPROPion (WELLBUTRIN SR) 150 MG 12 hr tablet Take 150 mg by mouth 2 (two) times daily.  Marland Kitchen ceFAZolin (ANCEF) 2-3 GM-% SOLR Inject 50 mLs (2 g total) into the vein every 8 (eight) hours.  . fluticasone (FLONASE) 50 MCG/ACT nasal spray Place 1 spray into the nose 2 (two) times daily.  Marland Kitchen glucose blood test strip Check blood sugar up to 4 times a day before meals and bedtime Dx code- 250.72, insulin requiring  . hydrochlorothiazide (HYDRODIURIL) 25 MG tablet TAKE 1 TABLET (25 MG TOTAL) BY MOUTH DAILY.  Marland Kitchen insulin lispro protamine-insulin lispro (HUMALOG 75/25) (75-25) 100 UNIT/ML SUSP Inject 65-125 Units into the skin 2 (two) times daily with a meal. Use 125 units every morning and use 65 units every evening  . LYRICA 100 MG capsule TAKE 2 CAPSULES BY MOUTH 3 TIMES DAILY  . metFORMIN (GLUCOPHAGE) 1000 MG tablet Take 1 tablet (1,000 mg total) by mouth 2 (two) times daily with a meal.  . NEXIUM 20 MG capsule TAKE 1 CAPSULE (20 MG TOTAL) BY MOUTH DAILY.  . nicotine (NICODERM CQ - DOSED IN MG/24 HOURS) 21 mg/24hr patch Place 1 patch onto the skin daily. Use for 6 weeks then call for lower dose  . silver sulfADIAZINE (SILVADENE) 1 % cream   . Percocet 7.5-325 mg Take q6h PRN pain.  Marland Kitchen clopidogrel (PLAVIX) 75 MG tablet Take 1 tablet (75 mg total) by mouth daily.     Allergies: Allergies  Allergen Reactions  . Iohexol      Desc: IV CONTRAST CAUSE NEPHROPATHY IN 2007   . Morphine Sulfate     REACTION: hives     Past Medical History: Past Medical History  Diagnosis Date  . Depression   . GERD (gastroesophageal reflux disease)   . Hyperlipidemia   . Hypertension   . Glaucoma   . PVD (peripheral  vascular disease) with claudication     Stents to bilateral common iliac arteries, on chronic plavix  . Tinnitus   . Diabetic peripheral neuropathy   . Fibromyalgia   . Hyperplastic colon polyp 12/2010    Per colonoscopy (12/2010) - Dr. Arlyce Dice  . Diverticulosis   . Asthma   . COPD 01/08/2007    PFT's 05/2007 : FEV1/FVC 82, FEV1 64% pred, FEF 25-75% 40% predicted, 16% improvement in FEV1 with bronchodilators.     . Type II diabetes mellitus with peripheral circulatory disorders, uncontrolled (250.72) DX: 1993    Insulin dep. Poor control. Complicated by diabetic foot ulcer and diabetic eye disease.    . Chronic pain syndrome 12/03/2011    Likely secondary to depression, "fibromyalgia", neuropathy, and obesity.    . Tobacco abuse   . Chronic osteomyelitis of foot     chronic, right secondary to diabetic foot ulcers  . Polymicrobial  bacterial infection 01/2013    GBS and S. aureus bacteremia // Source likely infected diabetic foot ulcer  . 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 Shortness of breath   . Chronic diastolic heart failure     grade 2 per 2D echocardiogram (01/2013)  . Ulcer of foot, chronic     Left. No OM per MRI (01/2013)    Past Surgical History: Past Surgical History  Procedure Laterality Date  . Abdominal hysterectomy  1997    secondary to uterine fibroids  . Tubal ligation    . Rotator cuff repair      bilateral  . Breast biopsy      multiple-benign per pt  . Wrist surgery      right  . Ganglion cyst excision      multiple  . Other surgical history      stents in lower ext  . Tee without cardioversion N/A 01/31/2013    Procedure: TRANSESOPHAGEAL ECHOCARDIOGRAM (TEE);  Surgeon: Pricilla Riffle, MD;  Location: [facility] ENDOSCOPY;  Service: Cardiovascular;  Laterality: N/A;  Rm [phone]  . Bladder surgery      bladder reconstruction surgery    Family History: Family  History  Problem Relation Age of Onset  . Diverticulosis Mother   . Diabetes Mother   . Hypertension Mother   . Congestive Heart Failure Mother   . Asthma Father     Social History: History   Social History  . Marital Status: Single    Spouse Name: N/A    Number of Children: 2  . Years of Education: college   Occupational History  . Disability     previously worked as a Psychologist, counselling History Main Topics  . Smoking status: Current Every Day Smoker -- 1.50 packs/day for 40 years    Types: Cigarettes  . Smokeless tobacco: Current User    Types: Snuff     Comment: Half pack or less.  . Alcohol Use: No  . Drug Use: No  . Sexually Active: Not on file   Other Topics Concern  . Not on file   Social History Narrative   On disability. Lives with son in [place]. 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      Vital Signs: Blood pressure 107/78, pulse 62, temperature 97.8 F (36.6 C), temperature source Oral, resp. rate 22, height 5\' 7"  (1.702 m), weight 281 lb 1.4 oz (127.5 kg), SpO2 96.00%.  Physical Exam: General: Vital signs reviewed and noted. Well-developed, well-nourished, in no acute distress; alert, appropriate and cooperative throughout examination.  Head: Normocephalic, atraumatic.  Eyes: PERRL, EOMI, No signs of anemia or jaundince.  Nose: Mucous membranes moist, not inflammed, nonerythematous.  Throat: Oropharynx nonerythematous, no exudate appreciated.   Neck: No deformities, masses, or tenderness noted. Supple, no JVD.  Lungs:  Normal respiratory effort. Clear to auscultation BL without crackles or wheezes.  Heart: RRR. S1 and S2 normal without gallop, murmur, or rubs.  Abdomen:  BS normoactive. Soft, Nondistended, non-tender.  No masses or organomegaly.  Extremities: No pretibial edema. Bilateral feet with large plantar open ulcers, approximately 6cm in diameter. No significant drainage noted, however on left lower extremity malodor is noted. Granulation tissue noted.    Left great toe - small subcentimeter blackened blister no lateral lower edge of toe. Decreased DP pulse on left, strong pulse on right.  Neurologic: A&O X3, CN  II - XII are grossly intact. Motor strength is 5/5 in the all 4 extremities.    Lab results: HISTORICAL LABS: Lab Results  Component Value Date   HGBA1C 9.5* 01/26/2013    Lab Results  Component Value Date   CHOL 189 07/09/2012   HDL 31* 07/09/2012   LDLCALC 109* 07/09/2012   TRIG 247* 07/09/2012   CHOLHDL 6.1 07/09/2012    Lab Results  Component Value Date   TSH 0.815 01/26/2013     Imaging results:  No results found.   Other results:  EKG (03/09/2013) - pending  TEE (01/31/2013) - Mitral valve: Small mobile echodensity that is probably attached to posterior mitral leafet. Attachment point not seen. Prolapses into LA. Consistent with small vegetation. Cannot completely exclude ruptured tertiary chordae tendinae. Mild MR. (best seen in image 31)    Tricuspid valve: Small mobile echodensity along atrial surface of tricuspid valve. Seen only in 4 chamber view (image 1). Consistent with small vegetation. Trace TR.  Duplex ultrasound RUE (03/03/2013) - No obvious evidence of DVT. Superficial thrombus in the basilic vein and the cephalic vein noted.     Assessment & Plan:  Pt is a 52 y.o. yo female with a PMHx of uncontrolled DM2 with chronic right foot osteomyelitis and left foot ulcer, COPD with ongoing tobacco abuse, grade 2 diastolic heart failure, and recent prolonged admission in 01/2013 with GBS/S.aureus bacteremia, infective endocarditis, and questionable tertiary chordae tendinae rupture - who was admitted on 03/09/2013 with symptoms of increased left foot drainage with new foot lesion. Given recent endocarditis will need to evaluate for possible new embolization.   1) Left lower extremity diabetic ulcer with concern for superimposed infection - patient has recently had increased malodorous discharge from her left  plantar foot ulcer, despite outpatient treatment with Ancef. During her ID clinic visit on 3/19, there was concern raised about osteomyelitis. Although worst on left, symptoms also involving right lower extremity ulcer.  Plan: - Will obtain MRI w and w/o contrast of BL Feet - Will consult Dr. Lajoyce Corners for evaluation for surgical need. - Continue Ancef for now. - Blood cultures x 2. - Check ESR, CBC with diff.  2) Left great toe lesion - new blackened lesion on left lateral great toe raises concern for Janeway lesion resultant of her endocarditis although lesion secondary to her known PVD is also considered (less likely) given last ABI's in 01/2013 were 0.9 bilaterally. Versus developing diabetic foot wound in setting of insensate feet. - Will consult vascular surgery. - Will consult cardiology for consideration for repeat TEE.  3) Polymicrobial bacteremia (GBS and S.aureus) and Infective endocarditis - involving TV and MV per TEE on 01/31/2013, likely 2/2 bacteremia resultant from foot ulcers. Concern for new Janeway lesions as above. On day 41 of Ancef, with plan to continue for at least 6 weeks. Blood culture results thus far: 01/26/13 blood cultures 2/2 S. Aureus and GBS. 2/6 & 2/8 & 01/30/2013 Blood cultures x 2 negative.  - Recheck blood cultures. - Cardiology consult as above for consideration for TEE. - Continue Ancef for now, unless clinical picture deteriorates.  4) Type II diabetes mellitus with peripheral circulatory disorders, uncontrolled - Will continue reduced dose of home medication once eating - Novolog 100 UNITS qAM and 50 in PM  5) Right basilic and cephalic vein superficial thrombosis - at area of recent RUE PICC. Doppler negative for DVT. - Repeat doppler US per request of Dr. Daiva Eves.  6) Hyperlipidemia - she has  not been on her Pravastatin - this medication was sent to the wrong pharmacy. Last LDL 109 in 06/2012. -  Restart statin  7) Depression - not well controlled.  Recently was changed to Wellbutrin 1 week prior to admission - therefore, full effect not expected. - Continue home wellbutrin.  8) Hypertension - BP stable on admission. Plan for MRI with contrast this afternoon - therefore, will hold her thiazide and ARB. - Continue home atenolol. - Hold thiazide and ARB for now.  9) COPD with ongoing tobacco - without acute exacerbation  - Continue home albuterol/ advair (formulary substitution ok)  10) GERD - PPI.  11) Chronic pain syndrome - on home Lyrica and Percocet. - Continue home meds.  12) Chronic diastolic heart failure without exacerbation - Hold home ARB while awaiting MRI  13) Tobacco abuse - smokes 1.5 packs per day. Was supposed to start nicotine patches, but her insurance not covering. - Nicotine patch during hospital course     DVT PPX - low molecular weight heparin  CODE STATUS - FULL  CONSULTS PLACED - Orthopedics, cardiology  DISPO - Disposition is deferred at this time, awaiting evaluation of bilateral diabetic ulcers.   Anticipated discharge in approximately 3-4 day(s).   The patient does have a current PCP Burns Spain, MD) and does need an [facility] hospital follow-up appointment after discharge.    Is the [facility] hospital follow-up appointment a one-time only appointment? no.  Does the patient have transportation limitations that hinder transportation to clinic appointments? unknown   Signed: Priscella Mann, DO  PGY-3, Internal Medicine Resident 03/09/2013, 12:06 PM

## 2013-03-09 NOTE — Progress Notes (Signed)
Subjective:    [name], female    DOB: 05-Nov-1961, 52 y.o.   MRN: [id number]  HPI   52 year old African American lady with diabetes peripheral neuropathy and chronic foot ulcers who was admitted to my service earlier this month with polymicrobial bacteremia with MSSA/GBS in 2/2 cultures and discolored feet, worsening of her ulcers. She had MRI which showed possible chronic osteo 4th phalanx, no abscess. Dr. Lajoyce Corners saw pt and performed bedside debridement of tissue of right foot. TEE showed MV vegetation with possible  tertiary chordae rupture and TV vegetations . Patient was seen by Dr Donata Clay with CVTS as well. Patient cleared his bacteremia by the 6th of February and was narrowed to IV ancef 2g IV q 8 hours with plans for at least 6 weeks.  I saw her roughly in late February and then again last week when she had  with erythema pain in left arm and bleeding from PICC site. She was found to have superficial thrombi on that site but no new DVT.   PICC removed, new one inserted in the opposite arm.  Since then she has had several things occur  #1 she has a new black painless raised lesion on big toe that developed 10 days ago but which I did not know about or see last week-  #2 She developed acute onset on Monday of nausea with vomiting and subjective fevers--which she attributed to viral illness  #3 she has had copious drainage of foul smelling material from her left foot ulcer where skin is coming loose from ulcer bed.   we spent greater than 45 minutes with the patients care  including greater than 50% of time in face to face counsel of the patient and in coordination of their care.    Review of Systems  Constitutional: Positive for fever, chills, activity change and fatigue. Negative for diaphoresis, appetite change and unexpected weight change.  HENT: Negative for congestion, sore throat, rhinorrhea, sneezing, trouble swallowing and sinus pressure.   Eyes:  Negative for photophobia and visual disturbance.  Respiratory: Negative for cough, chest tightness, shortness of breath, wheezing and stridor.   Cardiovascular: Negative for chest pain, palpitations and leg swelling.  Gastrointestinal: Positive for nausea and vomiting. Negative for abdominal pain, diarrhea, constipation, blood in stool, abdominal distention and anal bleeding.  Genitourinary: Negative for dysuria, hematuria, flank pain and difficulty urinating.  Musculoskeletal: Positive for myalgias. Negative for back pain, joint swelling, arthralgias and gait problem.  Skin: Positive for wound. Negative for color change, pallor and rash.  Neurological: Negative for dizziness, tremors, weakness and light-headedness.  Hematological: Negative for adenopathy. Does not bruise/bleed easily.  Psychiatric/Behavioral: Negative for behavioral problems, confusion, sleep disturbance, dysphoric mood, decreased concentration and agitation.       Objective:   Physical Exam  Constitutional: She is oriented to person, place, and time. She appears well-developed and well-nourished. No distress.  HENT:  Head: Normocephalic and atraumatic.  Mouth/Throat: Oropharynx is clear and moist. No oropharyngeal exudate.  Eyes: Conjunctivae and EOM are normal. Pupils are equal, round, and reactive to light. No scleral icterus.  Neck: Normal range of motion. Neck supple. No JVD present.  Cardiovascular: Normal rate, regular rhythm and normal heart sounds.  Exam reveals no gallop and no friction rub.   No murmur heard. Pulmonary/Chest: Effort normal and breath sounds normal. No respiratory distress. She has no wheezes. She has no rales. She exhibits no tenderness.  Abdominal: She exhibits no distension and  no mass. There is no tenderness. There is no rebound and no guarding.  Musculoskeletal: She exhibits no edema and no tenderness.       Feet:  Lymphadenopathy:    She has no cervical adenopathy.  Neurological: She is  alert and oriented to person, place, and time. She has normal reflexes. She exhibits normal muscle tone. Coordination normal.  Skin: Skin is warm and dry. She is not diaphoretic.     Psychiatric: She has a normal mood and affect. Her behavior is normal. Judgment and thought content normal.          Assessment & Plan:  Chronic osteomyelitis and foot ulcers now with foul smelling drainage and fevers, and chills on appropriate abx  --will admit to medicine service --I would obtain MRI oft he left foot and consider one of the right as well (time permitting today) --I would NOT broaden her abx from ancef at this point and would not do so until a culture proved need to do so --would ask Dr. 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

## 2013-03-10 ENCOUNTER — Encounter (HOSPITAL_COMMUNITY): Payer: Self-pay | Admitting: Internal Medicine

## 2013-03-10 ENCOUNTER — Encounter (HOSPITAL_COMMUNITY)
Admission: AD | Disposition: A | Discharge status: Home Health | Payer: Self-pay | Source: Ambulatory Visit | Attending: Internal Medicine

## 2013-03-10 DIAGNOSIS — I339 Acute and subacute endocarditis, unspecified: Secondary | ICD-10-CM

## 2013-03-10 DIAGNOSIS — I82629 Acute embolism and thrombosis of deep veins of unspecified upper extremity: Secondary | ICD-10-CM

## 2013-03-10 DIAGNOSIS — L97509 Non-pressure chronic ulcer of other part of unspecified foot with unspecified severity: Secondary | ICD-10-CM

## 2013-03-10 HISTORY — PX: TEE WITHOUT CARDIOVERSION: SHX5443

## 2013-03-10 LAB — COMPREHENSIVE METABOLIC PANEL
ALT: 7 U/L (ref 0–35)
AST: 42 U/L — ABNORMAL HIGH (ref 0–37)
Albumin: 2.8 g/dL — ABNORMAL LOW (ref 3.5–5.2)
Alkaline Phosphatase: 70 U/L (ref 39–117)
BUN: 10 mg/dL (ref 6–23)
CO2: 30 mEq/L (ref 19–32)
Calcium: 8.9 mg/dL (ref 8.4–10.5)
Chloride: 101 mEq/L (ref 96–112)
Creatinine, Ser: 0.62 mg/dL (ref 0.50–1.10)
GFR calc Af Amer: >90 mL/min (ref >90)
GFR calc non Af Amer: >90 mL/min (ref >90)
Glucose, Bld: 180 mg/dL — ABNORMAL HIGH (ref 70–99)
Potassium: 3.8 mEq/L (ref 3.5–5.1)
Sodium: 140 mEq/L (ref 135–145)
Total Bilirubin: 0.2 mg/dL — ABNORMAL LOW (ref 0.3–1.2)
Total Protein: 6.4 g/dL (ref 6.0–8.3)

## 2013-03-10 LAB — GLUCOSE, CAPILLARY
Glucose-Capillary: 166 mg/dL — ABNORMAL HIGH (ref 70–99)
Glucose-Capillary: 156 mg/dL — ABNORMAL HIGH (ref 70–99)
Glucose-Capillary: 157 mg/dL — ABNORMAL HIGH (ref 70–99)

## 2013-03-10 LAB — CBC
HCT: 32.9 % — ABNORMAL LOW (ref 36.0–46.0)
Hemoglobin: 10.9 g/dL — ABNORMAL LOW (ref 12.0–15.0)
MCH: 31.6 pg (ref 26.0–34.0)
MCHC: 33.1 g/dL (ref 30.0–36.0)
MCV: 95.4 fL (ref 78.0–100.0)
Platelets: 188 10*3/uL (ref 150–400)
RBC: 3.45 MIL/uL — ABNORMAL LOW (ref 3.87–5.11)
RDW: 14.5 % (ref 11.5–15.5)
WBC: 8.2 10*3/uL (ref 4.0–10.5)

## 2013-03-10 LAB — OCCULT BLOOD X 1 CARD TO LAB, STOOL: Fecal Occult Bld: POSITIVE — AB

## 2013-03-10 SURGERY — ECHOCARDIOGRAM, TRANSESOPHAGEAL
Anesthesia: Moderate Sedation

## 2013-03-10 MED ORDER — COLLAGENASE 250 UNIT/GM EX OINT
TOPICAL_OINTMENT | Freq: Every day | CUTANEOUS | Status: DC
  Administered 2013-03-10: 12:00:00 via TOPICAL
  Filled 2013-03-10: qty 30

## 2013-03-10 MED ORDER — WARFARIN SODIUM 5 MG PO TABS: 5.0000 mg | ORAL_TABLET | ORAL | Status: DC

## 2013-03-10 MED ORDER — MIDAZOLAM HCL 10 MG/2ML IJ SOLN
INTRAMUSCULAR | Status: DC | PRN
  Administered 2013-03-10: 1 mg via INTRAVENOUS
  Administered 2013-03-10 (×2): 2 mg via INTRAVENOUS

## 2013-03-10 MED ORDER — COUMADIN BOOK: 1.0000 | Freq: Once | Status: DC

## 2013-03-10 MED ORDER — SODIUM CHLORIDE 0.9 % IJ SOLN: 10.0000 mL | INTRAMUSCULAR | Status: DC | PRN

## 2013-03-10 MED ORDER — COLLAGENASE 250 UNIT/GM EX OINT: TOPICAL_OINTMENT | Freq: Every day | CUTANEOUS | Status: DC

## 2013-03-10 MED ORDER — AMOXICILLIN-POT CLAVULANATE 875-125 MG PO TABS
1.0000 | ORAL_TABLET | Freq: Two times a day (BID) | ORAL | Status: DC
  Administered 2013-03-10: 1 via ORAL
  Filled 2013-03-10 (×2): qty 1

## 2013-03-10 MED ORDER — WARFARIN VIDEO
Freq: Once | Status: AC
  Administered 2013-03-10: 16:00:00

## 2013-03-10 MED ORDER — BUTAMBEN-TETRACAINE-BENZOCAINE 2-2-14 % EX AERO
INHALATION_SPRAY | CUTANEOUS | Status: DC | PRN
  Administered 2013-03-10: 2 via TOPICAL

## 2013-03-10 MED ORDER — AMOXICILLIN-POT CLAVULANATE 875-125 MG PO TABS: 1.0000 | ORAL_TABLET | Freq: Two times a day (BID) | ORAL | Status: DC

## 2013-03-10 MED ORDER — WARFARIN SODIUM 5 MG PO TABS
5.0000 mg | ORAL_TABLET | Freq: Once | ORAL | Status: DC
  Filled 2013-03-10: qty 1

## 2013-03-10 MED ORDER — FENTANYL CITRATE 0.05 MG/ML IJ SOLN
INTRAMUSCULAR | Status: DC | PRN
  Administered 2013-03-10: 25 ug via INTRAVENOUS
  Administered 2013-03-10: 50 ug via INTRAVENOUS

## 2013-03-10 MED ORDER — SIMVASTATIN 20 MG PO TABS: 20.0000 mg | ORAL_TABLET | Freq: Every day | ORAL | Status: DC

## 2013-03-10 MED ORDER — WARFARIN - PHARMACIST DOSING INPATIENT: Freq: Every day | Status: DC

## 2013-03-10 MED ORDER — MIDAZOLAM HCL 5 MG/ML IJ SOLN
INTRAMUSCULAR | Status: AC
  Filled 2013-03-10: qty 2

## 2013-03-10 MED ORDER — FENTANYL CITRATE 0.05 MG/ML IJ SOLN
INTRAMUSCULAR | Status: AC
  Filled 2013-03-10: qty 2

## 2013-03-10 MED ORDER — COUMADIN BOOK
Freq: Once | Status: AC
  Administered 2013-03-10: 16:00:00
  Filled 2013-03-10: qty 1

## 2013-03-10 NOTE — Consult Note (Signed)
WOC consult ordered at the same time with ortho eval. Dr. Lajoyce Corners has seen patient and written orders for wound care. WOC will not see pt. For this reason.  Aizah Gehlhausen [place] RN,CWOCN [phone]

## 2013-03-10 NOTE — Progress Notes (Signed)
Received from test.  Voiding.  Alert and cooperative.  Pink arm band to right arm.

## 2013-03-10 NOTE — Progress Notes (Signed)
VASCULAR LAB PRELIMINARY  PRELIMINARY  PRELIMINARY  PRELIMINARY  Right upper extremity venous Doppler completed.    Preliminary report:  There is non occlusive DVT noted in the right brachial vein.  There is SVT noted in the right basilic vein.   *Called RN and let her know to restrict right arm secondary to DVT.  Briselda Naval, RVT 03/10/2013, 11:08 AM

## 2013-03-10 NOTE — Progress Notes (Signed)
03/10/13 1045 Noted CM referral for Home health needs.  Pt. is active with [facility] for [facility] RN for IV antibiotics.  TC to [place], with [facility], to make aware of pt.'s admission.  NCM will follow for further dc needs.  Tera Mater, RN, BSN NCM [phone]

## 2013-03-10 NOTE — Progress Notes (Signed)
  Echocardiogram Echocardiogram Transesophageal has been performed.  Georgian Co 03/10/2013, 9:54 AM

## 2013-03-10 NOTE — Discharge Instructions (Signed)
Please stop taking PLavix  Please starting Augumentin  Please follow up with all your appoitments  Thank you for allowing [place] to be involved in your healthcare while you were hospitalized at [facility].   Please note that there have been changes to your home medications.  --> PLEASE LOOK AT YOUR DISCHARGE MEDICATION LIST FOR DETAILS.   Please call your PCP if you have any questions or concerns, or any difficulty getting any of your medications.  Please return to the ER if you have worsening of your symptoms or new severe symptoms arise.     Wound care instructions (per Dr. Lajoyce Corners) 1. Elevate both legs above the heart. 2. Minimize weightbearing bilateral feet. 3. Apply Santyl ointment plus dry dressing change to both foot ulcers daily.   TIPS TO HELP YOU QUIT SMOKING 1-800-QUIT-NOW  This document explains the best ways for you to quit smoking and new treatments to help. It lists new medicines that can double or triple your chances of quitting and quitting for good. It also considers ways to avoid relapses and concerns you may have about quitting, including weight gain.   Nicotine: A Powerful Addiction If you have tried to quit smoking, you know how hard it can be. It is hard because nicotine is a very addictive drug. For some people, it can be as addictive as heroin or cocaine. Usually, people make 2 or 3 tries, or more, before finally being able to quit. Each time you try to quit, you can learn about what helps and what hurts. Quitting takes hard work and a lot of effort, but you can quit smoking.   Quitting smoking is one of the most important things you will ever do You will live longer, feel better, and live better.  The impact on your body of quitting smoking is felt almost immediately:   Five keys to quitting: Studies have shown that these 5 steps will help you quit smoking and quit for good. You have the best chances of quitting if you use them together:    1. GET READY  Set a quit date.  Change your environment.  Get rid of ALL cigarettes, ashtrays, matches, and lighters in your home, car, and place of work.  Do not let people smoke in your home.  Review your past attempts to quit. Think about what worked and what did not.  Once you quit, do not smoke. NOT EVEN A PUFF!   2. GET SUPPORT AND ENCOURAGEMENT  Tell your family, friends, and coworkers that you are going to quit and need their support. Ask them not to smoke around you.  Get individual, group, or telephone counseling and support.  Many smokers find one or more of the many self-help books available useful in helping them quit and stay off tobacco.   3. LEARN NEW SKILLS AND BEHAVIORS  Try to distract yourself from urges to smoke. Talk to someone, go for a walk, or occupy your time with a task.  When you first try to quit, change your routine. Take a different route to work. Drink tea instead of coffee. Eat breakfast in a different place.  Do something to reduce your stress. Take a hot bath, exercise, or read a book.  Plan something enjoyable to do every day. Reward yourself for not smoking.  Explore interactive web-based programs that specialize in helping you quit.   4. GET MEDICINE AND USE IT CORRECTLY .  Medicines can help you stop smoking and decrease  the urge to smoke. Combining medicine with the above behavioral methods and support can quadruple your chances of successfully quitting smoking.  Talk with your doctor about these options.  5. BE PREPARED FOR RELAPSE OR DIFFICULT SITUATIONS  Most relapses occur within the first 3 months after quitting. Do not be discouraged if you start smoking again. Remember, most people try several times before they finally quit.  You may have symptoms of withdrawal because your body is used to nicotine. You may crave cigarettes, be irritable, feel very hungry, cough often, get headaches, or have difficulty concentrating.  The withdrawal  symptoms are only temporary. They are strongest when you first quit, but they will go away within 10 to 14 days.   Quitting takes hard work and a lot of effort, but you can quit smoking.   FOR MORE INFORMATION  Smokefree.gov (http://www.davis-sullivan.com/) provides free, accurate, evidence-based information and professional assistance to help support the immediate and long-term needs of people trying to quit smoking.  Document Released: 12/02/2001 Document Re-Released: 05/28/2010  [facility] Patient Information 2011 [place], [place].

## 2013-03-10 NOTE — H&P (View-Only) (Deleted)
Patient is currently active with [facility] for chronic disease management services.  Patient has been engaged by a RN Community Care Coordinator.  Our community based plan of care has focused on disease management of her smoking cessation and Diabetes Type 2; uses glucometer; takes medications as prescribed; checks feet daily; Current Diabetes related medical conditions are Eye problems, High blood pressure, High cholesterol; Year of diagnosis October 1993; does not take an aspirin a day; Sees Diabetes provider 4 or more times per year; Lowest CBG 66; Highest CBG 499; hypoglycemia frequency rarely; MD managing Diabetes Dr. Nutan. Hyperlipidemia: taking meds as prescribed; participates in regular exercise routine; does not follow a low fat diet; does not maintain a healthy weight; takes an aspirin a day; does not take a statin; MD managing hyperlipidemia [place]; does not add fiber to diet.  Hypertension: does not adhere to HTN diet; takes medications as prescribed; does not do BP self monitoring; Other Hypertension History: Patient is not self monitoring at this time.  Patient will receive a post discharge transition of care call and will be evaluated for monthly home visits for assessments and disease process education.  Made inpatient Case Manager Kim Tucker RN aware that [facility] following. Of note, [facility] services does not replace or interfere with any services that are arranged by inpatient case management or social work.  For additional questions or referrals please contact Tim Henderson BSN RN [facility]ia[facility] at [phone].but does agree to purchase blood presure machine through [facility] to start self monitoring in future.      

## 2013-03-10 NOTE — Progress Notes (Signed)
Wound care per order, left foot center at plantar has eschar that is hanging on with some sloughing, red area with purulent drainage, moderate amount, scant odor.  Right foot, plantar, eschar covering entire wound, skin dry and peeling periwound intact.  No drainage noted, wound care per order.

## 2013-03-10 NOTE — Telephone Encounter (Signed)
review 

## 2013-03-10 NOTE — Progress Notes (Signed)
ANTICOAGULATION CONSULT NOTE - Initial Consult  Pharmacy Consult for warfarin Indication: DVT  Allergies  Allergen Reactions  . Iohexol      Desc: IV CONTRAST CAUSE NEPHROPATHY IN 2007   . Morphine Sulfate Itching and Rash    Patient Measurements: Height: 5\' 7"  (170.2 cm) Weight: 281 lb 8.4 oz (127.7 kg) IBW/kg (Calculated) : 61.6  Vital Signs: Temp: 97.9 F (36.6 C) (03/20 1530) Temp src: Oral (03/20 1530) BP: 159/91 mmHg (03/20 1530) Pulse Rate: 72 (03/20 1530)  Labs:  Recent Labs  03/09/13 1057 03/09/13 1105 03/10/13 0600  HGB  --  12.0 10.9*  HCT  --  37.2 32.9*  PLT  --  223 188  LABPROT  --  15.4*  --   INR  --  1.24  --   CREATININE  --  0.62 0.62  TROPONINI <0.30  --   --     Estimated Creatinine Clearance: 111.7 ml/min (by C-G formula based on Cr of 0.62).   Medical History: Past Medical History  Diagnosis Date  . Depression   . GERD (gastroesophageal reflux disease)   . Hyperlipidemia   . Hypertension   . Glaucoma   . PVD (peripheral vascular disease) with claudication     Stents to bilateral common iliac arteries (left 2005, right 2008), on chronic plavix  . Tinnitus   . Diabetic peripheral neuropathy   . Fibromyalgia   . Hyperplastic colon polyp 12/2010    Per colonoscopy (12/2010) - Dr. Arlyce Dice  . Diverticulosis   . Asthma   . COPD 01/08/2007    PFT's 05/2007 : FEV1/FVC 82, FEV1 64% pred, FEF 25-75% 40% predicted, 16% improvement in FEV1 with bronchodilators.     . Type II diabetes mellitus with peripheral circulatory disorders, uncontrolled (250.72) DX: 1993    Insulin dep. Poor control. Complicated by diabetic foot ulcer and diabetic eye disease.    . Chronic pain syndrome 12/03/2011    Likely secondary to depression, "fibromyalgia", neuropathy, and obesity.    . Tobacco abuse   . Chronic osteomyelitis of foot     chronic, right secondary to diabetic foot ulcers  . Polymicrobial bacterial infection 01/2013    GBS and S. aureus  bacteremia // Source likely infected diabetic foot ulcer  . 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 Shortness of breath   . Chronic diastolic heart failure     grade 2 per 2D echocardiogram (01/2013)  . Ulcer of foot, chronic     Left. No OM per MRI (01/2013)    Medications:  Prescriptions prior to admission  Medication Sig Dispense Refill  . albuterol (PROVENTIL HFA;VENTOLIN HFA) 108 (90 BASE) MCG/ACT inhaler Inhale 2 puffs into the lungs every 6 (six) hours as needed. For breathing      . aspirin 81 MG tablet Take 81 mg by mouth daily.       Marland Kitchen atenolol (TENORMIN) 100 MG tablet Take 100 mg by mouth daily.      . baclofen (LIORESAL) 10 MG tablet Take 10 mg by mouth 3 (three) times daily.      . benazepril (LOTENSIN) 40 MG tablet Take 40 mg by mouth daily.      Marland Kitchen buPROPion (WELLBUTRIN SR) 150 MG 12 hr tablet Take 150 mg by mouth 2 (two) times daily.      Marland Kitchen ceFAZolin (ANCEF) 2-3 GM-% SOLR Inject 50  mLs (2 g total) into the vein every 8 (eight) hours.  1 each  0  . clopidogrel (PLAVIX) 75 MG tablet Take 75 mg by mouth daily.      Marland Kitchen esomeprazole (NEXIUM) 20 MG capsule Take 20 mg by mouth daily before breakfast.      . fluticasone (FLONASE) 50 MCG/ACT nasal spray Place 2 sprays into the nose 2 (two) times daily.      . Fluticasone-Salmeterol (ADVAIR) 250-50 MCG/DOSE AEPB Inhale 1 puff into the lungs every 12 (twelve) hours.      Marland Kitchen glucose blood test strip Check blood sugar up to 4 times a day before meals and bedtime Dx code- 250.72, insulin requiring  150 each  12  . hydrochlorothiazide (HYDRODIURIL) 25 MG tablet Take 25 mg by mouth daily.      . insulin lispro protamine-insulin lispro (HUMALOG 75/25) (75-25) 100 UNIT/ML SUSP Inject 65-125 Units into the skin 2 (two) times daily with a meal. Use 125 units every morning and use 65 units every evening      . metFORMIN (GLUCOPHAGE) 1000 MG tablet  Take 1 tablet (1,000 mg total) by mouth 2 (two) times daily with a meal.  60 tablet  11  . oxyCODONE-acetaminophen (PERCOCET) 7.5-325 MG per tablet Take 1 tablet by mouth every 6 (six) hours as needed for pain.      . pregabalin (LYRICA) 100 MG capsule Take 200 mg by mouth 3 (three) times daily.      . silver sulfADIAZINE (SILVADENE) 1 % cream Apply 1 application topically daily. Apply to feet after soaking      . [DISCONTINUED] buPROPion (WELLBUTRIN SR) 150 MG 12 hr tablet Take one pill by mouth once daily for three days then increase to one pill twice a day.  60 tablet  2    Assessment: 52 yof who was on outpatient IV antibiotics for endocarditis developed an upper extremity DVT due to the PICC line. Right PICC has been removed. Now to start coumadin for anticoagulation with discharge likely today. Coumadin clinic recommended 5mg  daily then 2.5mg  on Sunday with a follow-up appointment on Monday. Baseline INR is 1.24, pt is also anemic with H/H of 10.9/32.9, plts are WNL. She has been receiving lovenox 40mg  daily. She is also on plavix + aspirin.   Goal of Therapy:  INR 2-3   Plan:  1. Coumadin 5mg  PO x 1 tonight 2. Coumadin education materials 3. Coumadin education by pharmacist 4. Daily INR (in case patient discharge is delayed) 5. MD - Please address if you want patient discharged on coumadin + aspirin + plavix. This combination can increase the risk of bleeding.   Tu Bayle, Drake Leach 03/10/2013,3:36 PM

## 2013-03-10 NOTE — Progress Notes (Signed)
Post PICC d/c care given to patient via teach back method.

## 2013-03-10 NOTE — CV Procedure (Signed)
Procedure: TEE  Indication: Endocarditis  Sedation: Versed 5 mg IV, Fentanyl 75 mcg IV  Findings: Normal LV size and systolic function, EF 55-60%.  No evidence for endocarditis seen, all valves well-visualized.  Normal RV size and systolic function.   No complications.   Marca Ancona 03/10/2013 9:32 AM

## 2013-03-10 NOTE — Discharge Summary (Signed)
Internal Medicine Teaching [facility] Discharge Note  Name: Jennifer [name] MRN: [id number] DOB: 1961-11-10 52 y.o.  Date of Admission: 03/09/2013 10:03 AM Date of Discharge: 03/10/2013 Attending Physician: Farley Ly, MD  Discharge Diagnosis: Principal Problem:   Chronic osteomyelitis of foot Active Problems:   Type II diabetes mellitus with peripheral circulatory disorders, uncontrolled (250.72)   Hyperlipidemia   DEPRESSION   HYPERTENSION   COPD   GASTROESOPHAGEAL REFLUX DISEASE   PVD (peripheral vascular disease) with claudication   Chronic pain syndrome   Right arm pain   Infective endocarditis   Chronic diastolic heart failure   Tobacco abuse   Foot lesion   Superficial venous thrombosis of upper extremity   DVT of upper extremity (deep vein thrombosis)   Discharge Medications:   Medication List    STOP taking these medications       ceFAZolin 2-3 GM-% Solr  Commonly known as:  ANCEF     clopidogrel 75 MG tablet  Commonly known as:  PLAVIX      TAKE these medications       albuterol 108 (90 BASE) MCG/ACT inhaler  Commonly known as:  PROVENTIL HFA;VENTOLIN HFA  Inhale 2 puffs into the lungs every 6 (six) hours as needed. For breathing     amoxicillin-clavulanate 875-125 MG per tablet  Commonly known as:  AUGMENTIN  Take 1 tablet by mouth every 12 (twelve) hours.     aspirin 81 MG tablet  Take 81 mg by mouth daily.     atenolol 100 MG tablet  Commonly known as:  TENORMIN  Take 100 mg by mouth daily.     benazepril 40 MG tablet  Commonly known as:  LOTENSIN  Take 40 mg by mouth daily.     buPROPion 150 MG 12 hr tablet  Commonly known as:  WELLBUTRIN SR  Take 150 mg by mouth 2 (two) times daily.     collagenase ointment  Commonly known as:  SANTYL  Apply topically daily.     coumadin book Misc  1 each by Does not apply route once.     esomeprazole 20 MG capsule  Commonly known as:  NEXIUM  Take 20 mg by mouth daily before  breakfast.     fluticasone 50 MCG/ACT nasal spray  Commonly known as:  FLONASE  Place 2 sprays into the nose 2 (two) times daily.     Fluticasone-Salmeterol 250-50 MCG/DOSE Aepb  Commonly known as:  ADVAIR  Inhale 1 puff into the lungs every 12 (twelve) hours.     glucose blood test strip  Check blood sugar up to 4 times a day before meals and bedtime Dx code- 250.72, insulin requiring     hydrochlorothiazide 25 MG tablet  Commonly known as:  HYDRODIURIL  Take 25 mg by mouth daily.     insulin lispro protamine-insulin lispro (75-25) 100 UNIT/ML Susp  Commonly known as:  HUMALOG 75/25  Inject 65-125 Units into the skin 2 (two) times daily with a meal. Use 125 units every morning and use 65 units every evening     metFORMIN 1000 MG tablet  Commonly known as:  GLUCOPHAGE  Take 1 tablet (1,000 mg total) by mouth 2 (two) times daily with a meal.     oxyCODONE-acetaminophen 7.5-325 MG per tablet  Commonly known as:  PERCOCET  Take 1 tablet by mouth every 6 (six) hours as needed for pain.     pregabalin 100 MG capsule  Commonly known as:  LYRICA  Take 200 mg by mouth 3 (three) times daily.     silver sulfADIAZINE 1 % cream  Commonly known as:  SILVADENE  Apply 1 application topically daily. Apply to feet after soaking     simvastatin 20 MG tablet  Commonly known as:  ZOCOR  Take 1 tablet (20 mg total) by mouth daily at 6 PM.     warfarin 5 MG tablet  Commonly known as:  COUMADIN  Take 1 tablet (5 mg total) by mouth as directed. Please take 5 mg on Friday 03/11/2013 and on Saturday 03/13/2103  Please take 2.5 mg on Sunday  Then you will be seen by Dr Alexandria Lodge on Monday for further instructions.        Disposition and follow-up:   Ms.Siniya L Haren was discharged from [facility] in Good condition.  At the hospital follow up visit please address   -Follow up blood cultures. -Repeat CBC. -Followup with compliance with Coumadin therapy, and INR  checks. -Please consider counseling patient about smoking cessation. -Please assist in coordination of her multiple appointments with Dr. Lajoyce Corners, Dr. Alexandria Lodge and Dr. Zenaida Niece dam  Follow-up Appointments: Follow-up Information   Follow up with Nadara Mustard, MD On 03/28/2013. [facility] - Your appointment is on Monday, April 7 at 2:15 pm)    Contact information:   [address] [place] [address] [phone]       Follow up with Blanch Media, MD On 04/05/2013. (INTERNAL MEDICINE OUTPATIENT CLINIC - Your appointment is on 04/05/2013 at 9:15AM)    Contact information:   [address] [place] [address] [phone]       Follow up with Acey Lav, MD On 03/17/2013. (INFECTIOUS DISEASE OUTPATIENT CLINC - Your apointment is on  03/17/2013 at 2:00pm)    Contact information:   [address] [place] [address] [phone]       Follow up with Gar Ponto, [facility] On 03/14/2013. (COUMADIN CLINIC - Your appointment is on 03/14/2013 at 10AM)    Contact information:   [address] [place], [place] [address] [phone]      Discharge Orders   Future Appointments Provider Department Dept Phone   03/14/2013 10:00 AM [facility] [phone]   03/17/2013 2:00 PM Randall Hiss, MD [facility] [phone]   04/05/2013 9:15 AM Burns Spain, MD [facility] [phone]   04/06/2013 1:00 PM [facility] SITE 3 ECHO LAB [phone]   04/06/2013 3:00 PM Kerin Perna, MD [facility] [phone]   Future Orders Complete By Expires     Diet - low sodium heart healthy  As directed     Increase activity slowly  As directed        Consultations:  Vascular Surgery service  Procedures Performed:  Dg Chest 2 View  02/23/2013   *RADIOLOGY REPORT*  Clinical Data: Recent cardioversion  CHEST - 2 VIEW  Comparison: Portable chest x-ray of 01/25/2013  Findings: No active infiltrate or effusion is seen.  Right PICC line is present with the tip seen to the lower SVC.  No pneumothorax is noted.  Mild cardiomegaly is stable. An opacity at the left cardiophrenic angle probably represents degenerative spurring in the lower thoracic spine, appearing unchanged compared to the chest x-ray from September 2012.  IMPRESSION: Stable cardiomegaly.  Right  PICC line tip in the lower SVC.   Original Report Authenticated By: Dwyane Dee, M.D.    Mr Foot Right W Wo Contrast  03/10/2013  *RADIOLOGY REPORT*  Clinical Data: Chronic osteomyelitis of the right foot.  Discharge from right  plantar soft tissue ulceration.  MRI OF THE RIGHT FOREFOOT WITHOUT AND WITH CONTRAST  Technique:  Multiplanar, multisequence MR imaging was performed both before and after administration of intravenous contrast.  Contrast: 20mL MULTIHANCE GADOBENATE DIMEGLUMINE 529 MG/ML IV SOLN  Comparison: MRI dated 01/27/2013  Findings: There has been no significant change in the appearance of the right foot since the prior exam.  No evidence of active osteomyelitis, soft tissue abscess, or new joint effusions.  No new pathologic enhancement after contrast administration  Subcutaneous edema is diffusely more prominent in the foot.  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  IMPRESSION: Increased subcutaneous edema in the forefoot. No  significant change since the prior exam.  No evidence of active osteomyelitis or soft tissue abscess.   Original Report Authenticated By: Francene Boyers, M.D.    Mr Foot Left W Wo Contrast  03/10/2013  *RADIOLOGY REPORT*  Clinical Data: Chronic plantar soft tissue ulceration of the left foot.  New soft tissue ulcer on the lateral aspect of the great toe.  MRI OF THE LEFT FOREFOOT WITHOUT AND WITH CONTRAST  Technique:  Multiplanar, multisequence MR imaging was  performed both before and after administration of intravenous contrast.  Contrast: 20mL MULTIHANCE GADOBENATE DIMEGLUMINE 529 MG/ML IV SOLN  Comparison: MRI dated 01/27/2013  Findings: There is no evidence of osteomyelitis of the forefoot. There are no soft tissue abscesses or significant joint effusions. There is increased dorsal subcutaneous soft tissue edema.  IMPRESSION:  1.  Increased dorsal soft tissue edema in the forefoot. 2.  No evidence of osteomyelitis or soft tissue abscess. 3.  No pathologic enhancement after contrast administration.   Original Report Authenticated By: Francene Boyers, M.D.    Ir Fluoro Guide Cv Line Left  03/03/2013  * RADIOLOGY REPORT *  Clinical data: Sepsis, in need of long-term IV antibiotics. Bleeding around previously placed right arm PICC catheter.  PICC PLACEMENT WITH ULTRASOUND AND FLUOROSCOPY  Technique: After written informed consent was obtained, patient was placed in the supine position on angiographic table. Patency of the left basilic vein was confirmed with ultrasound with image documentation. An appropriate skin site was determined. Skin site was marked. Region was prepped using maximum barrier technique including cap and mask, sterile gown, sterile gloves, large sterile sheet, and Chlorhexidine   as cutaneous antisepsis.  The region was infiltrated locally with 1% lidocaine.   Under real-time ultrasound guidance, the left basilic vein was accessed with a 21 gauge micropuncture needle; the needle tip within the vein was confirmed with ultrasound image documentation.   Needle exchanged over a 018 guidewire for a peel-away sheath, through which a 5-French single- lumen power injectable PICC trimmed to 45cm was advanced, positioned with its tip near the cavoatrial junction.  Spot chest radiograph confirms appropriate catheter position.  Catheter was flushed per protocol and secured externally with 0-Prolene sutures. The patient tolerated procedure well, with no immediate  complication. The previously placed right arm PICC was then removed intact and hemostasis achieved at the site.  IMPRESSION: Technically successful five [place] single lumen power injectable PICC placement   Original Report Authenticated By: D. Andria Rhein, MD    Ir US Guide Vasc Access Left  03/03/2013  * RADIOLOGY REPORT *  Clinical data: Sepsis,  in need of long-term IV antibiotics. Bleeding around previously placed right arm PICC catheter.  PICC PLACEMENT WITH ULTRASOUND AND FLUOROSCOPY  Technique: After written informed consent was obtained, patient was placed in the supine position on angiographic table. Patency of the left basilic vein was confirmed with ultrasound with image documentation. An appropriate skin site was determined. Skin site was marked. Region was prepped using maximum barrier technique including cap and mask, sterile gown, sterile gloves, large sterile sheet, and Chlorhexidine   as cutaneous antisepsis.  The region was infiltrated locally with 1% lidocaine.   Under real-time ultrasound guidance, the left basilic vein was accessed with a 21 gauge micropuncture needle; the needle tip within the vein was confirmed with ultrasound image documentation.   Needle exchanged over a 018 guidewire for a peel-away sheath, through which a 5-French single- lumen power injectable PICC trimmed to 45cm was advanced, positioned with its tip near the cavoatrial junction.  Spot chest radiograph confirms appropriate catheter position.  Catheter was flushed per protocol and secured externally with 0-Prolene sutures. The patient tolerated procedure well, with no immediate complication. The previously placed right arm PICC was then removed intact and hemostasis achieved at the site.  IMPRESSION: Technically successful five [place] single lumen power injectable PICC placement   Original Report Authenticated By: D. Andria Rhein, MD     TEE: 03/10/2013 -no vegetations noted.  Right upper extremity Doppler 03/10/2013  - Preliminary report: There is non occlusive DVT noted in the right brachial vein. There is SVT noted in the right basilic vein.    Admission HPI:   Chief Complaint: Left foot lesion with drainage  History of Present Illness:  Patient is a 52 y.o. female with a PMHx of uncontrolled DM2 with multiple complications including chronic right foot osteomyelitis and left foot ulcer, COPD with ongoing tobacco abuse, grade 2 diastolic heart failure, and recent prolonged admission in 01/2013 with GBS/S. Aureus bacteremia, infective endocarditis, and questionable tertiary chordae tendinae rupture. She is admitted to [facility] on transfer from the [facility] for Infectious Diseases. It seems that over the past 3 weeks, the patient has noted new black painless raised lesion on the left lateral great toe, that has been stable in size since time of onset. As well, the skin is coming loose from the ulcer bed over this timeframe. As well, over the last 1 day, patient has noted increased malodorous discharge from the left plantar diabetic ulcer. She admits to some discharge from the right plantar ulcer, however feels that it is less than experienced on the left. She denies increased erythema, warmth, or pain of her feet (of note she is insensate bilaterally). Without associated redness, warmth, swelling from prior. She did have 2 episodes of vomiting 2 days ago, without recurrence.  She has remained without fevers, chills, nausea, recurrent vomiting, increased fatigue, palpitations, chest pain. She is compliant with her 3 times a day Ancef (currently on day 41), and states that she has not missed any doses. She has last been evaluated by Dr. Lajoyce Corners approximately one week ago, and he had recommended for daily cleansing of the wounds, and did not seem overtly concerned about progression or superimposed infection.  Of note, during last ID followup visit, the patient did complain of minimal bleeding and pain at her right upper  extremity PICC line site. Doppler was performed at that time and was positive for only superficial thrombus. The PICC line was removed, and replaced in the left upper extremity. The patient has not  noticed any increase in erythema, swelling, or pain surrounding the new PICC line site  Review of Systems:  Constitutional:  denies fever, chills, diaphoresis, appetite change and fatigue.   HEENT:  denies eye pain, redness, hearing loss, ear pain, congestion, sore throat, rhinorrhea.   Respiratory:  denies SOB, DOE, cough, chest tightness, and wheezing.   Cardiovascular:  denies chest pain, palpitations and leg swelling.   Gastrointestinal:  admits to nausea, vomiting that has resolved.  Denies abdominal pain, diarrhea, constipation, blood in stool.   Genitourinary:  denies dysuria, urgency, frequency, hematuria, flank pain and difficulty urinating.   Musculoskeletal:  admits to chronic muscle spasms in legs and stomach.  Denies myalgias, back pain, joint swelling, arthralgias.   Skin:  admits to diabetic ulcers on feet.  Denies new rashes   Neurological:  admits to chronic paresthesias in BL hands - unchanged from baseline  Denies dizziness, seizures, syncope, weakness, light-headedness, numbness and headaches.   Hematological:  admits to easy bleeding from gums.  Denies recent increased bruising, family bleeding history.   Psychiatric:  admits to depression  Denies suicidal ideation/ homicidal ideation.    Vital Signs:  Blood pressure 107/78, pulse 62, temperature 97.8 F (36.6 C), temperature source Oral, resp. rate 22, height 5\' 7"  (1.702 m), weight 281 lb 1.4 oz (127.5 kg), SpO2 96.00%.  Physical Exam:  General:  Vital signs reviewed and noted. Well-developed, well-nourished, in no acute distress; alert, appropriate and cooperative throughout examination.   Head:  Normocephalic, atraumatic.   Eyes:  PERRL, EOMI, No signs of anemia or jaundince.   Nose:  Mucous membranes moist, not  inflammed, nonerythematous.   Throat:  Oropharynx nonerythematous, no exudate appreciated.   Neck:  No deformities, masses, or tenderness noted. Supple, no JVD.   Lungs:  Normal respiratory effort. Clear to auscultation BL without crackles or wheezes.   Heart:  RRR. S1 and S2 normal without gallop, murmur, or rubs.   Abdomen:  BS normoactive. Soft, Nondistended, non-tender. No masses or organomegaly.   Extremities:  No pretibial edema. Bilateral feet with large plantar open ulcers, approximately 6cm in diameter. No significant drainage noted, however on left lower extremity malodor is noted. Granulation tissue noted.  Left great toe - small subcentimeter blackened blister no lateral lower edge of toe.  Decreased DP pulse on left, strong pulse on right.   Neurologic:  A&O X3, CN II - XII are grossly intact. Motor strength is 5/5 in the all 4 extremities.       Hospital Course by problem list:  Bilateral plantar ulcers: She was admitted with a concern of recent increased malodorous discharge from her left plantar foot ulcer, despite outpatient treatment with Ancef. Both legs are insensate due to severe peripheral neuropathy secondary to diabetes. During her ID clinic visit on 3/19, there was concern raised about feet osteomyelitis. She denied any symptoms of fevers, chills, or vomiting. No increased fatigue. Bilateral foot MRIs 03/09/2013 - negative for osteomyelitis but noted some soft tissue edema. Repeat blood cultures - no growth to date. Dr. Lajoyce Corners evaluated the patient during her hospitalization and recommended outpatient debridement. An appointment was setup to see the patient on Monday, 03/14/2013 for further care. Patient was discharged with Santyl cream to apply in the interim period. Infectious disease recommended removal of her PICC previous line and switch antibiotics to oral Augmentin for one month. The patient will see Dr. Zenaida Niece dam on 03/17/2013.   Asymptomatic right upper extremity  Nonocclusive DVT: Secondary to a previous  PICC line. Right upper extremity venous Doppler performed on the 03/10/2013 revealed a nonocclusive DVT in the right brachial vein. This was the site of her previous PICC line, which was changed to the left side, following some complications with erythema and pain. There was also noted superficial venous thrombosis in the right basilic vein. After discussing with the patient about the benefits, and risks of Coumadin therapy, the patient decided to pursue treatment. After consultation with Dr. Alexandria Lodge, She was initiated on Coumadin, and the plan will be to treat her for 3 months and then stop. Fortunately, this DVT is asymptomatic at this time and the option of not treating it was also reasonable. Coumadin was dosed by pharmacy, and the patient will followup with Dr. Alexandria Lodge in 4 days.   Peripheral vascular disease: When the patient was evaluated in the office at the infectious diseases clinic, there was a concern of reduced pulses, especially in the left extremity. She had Bilat iliac stents placed ( Right in 2008 - Dr Darrick Penna; left by IR 2005). ABIs in 01/2013 were more than 0.9 blaterally. During hospital stay, she was evaluated by a vasculr surgery, who recommended no further treatment as per circulation in the lower extremities is good. Discussed with Dr. Darrick Penna, regarding discontinuation of Plavix in view of her new Coumadin therapy, and he was in agreement that this medication can be discontinued.   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 Concern for new [place]ay lesions , which been noted at Infectious disease clinic by Dr. Zenaida Niece dam and therefore, the patient was admitted for further evaluation. She had completed a 6 weeks course of IV Ancef. Blood cultures 01/26/13 blood cultures 2/2 S. Aureus and GBS. 2/6 & 2/8 & 01/30/2013 Blood cultures x 2  negative. Recheck blood cultures were pending at time of discharge. A TEE was repeated and it was negative for vegetations. She was discharged on a 1 month course of oral Augmentin as recommended by Dr. Daiva Eves of infectious diseases.  No other changes were made for regular home medications for the treatment of hyperlipidemia, depression, hypertension, COPD, GERD, and chronic pain syndrome. This will further be followed up as outpatient by her primary care physician.  Discharge Vitals:  BP 159/91  Pulse 72  Temp(Src) 97.9 F (36.6 C) (Oral)  Resp 18  Ht 5\' 7"  (1.702 m)  Wt 281 lb 8.4 oz (127.7 kg)  BMI 44.08 kg/m2  SpO2 100%  Discharge Labs:  Results for orders placed during the hospital encounter of 03/09/13 (from the past 24 hour(s))  COMPREHENSIVE METABOLIC PANEL     Status: Abnormal   Collection Time    03/10/13  6:00 AM      Result Value Range   Sodium 140  135 - 145 mEq/L   Potassium 3.8  3.5 - 5.1 mEq/L   Chloride 101  96 - 112 mEq/L   CO2 30  19 - 32 mEq/L   Glucose, Bld 180 (*) 70 - 99 mg/dL   BUN 10  6 - 23 mg/dL   Creatinine, Ser 1.61  0.50 - 1.10 mg/dL   Calcium 8.9  8.4 - 09.6 mg/dL   Total Protein 6.4  6.0 - 8.3 g/dL   Albumin 2.8 (*) 3.5 - 5.2 g/dL   AST 42 (*) 0 - 37 U/L   ALT 7  0 - 35 U/L   Alkaline Phosphatase 70  39 - 117 U/L   Total Bilirubin 0.2 (*) 0.3 - 1.2 mg/dL   GFR calc non Af Amer >90  >90 mL/min   GFR calc Af Amer >90  >90 mL/min  CBC     Status: Abnormal   Collection Time    03/10/13  6:00 AM      Result Value Range   WBC 8.2  4.0 - 10.5 K/uL   RBC 3.45 (*) 3.87 - 5.11 MIL/uL   Hemoglobin 10.9 (*) 12.0 - 15.0 g/dL   HCT 09.8 (*) 11.9 - 14.7 %   MCV 95.4  78.0 - 100.0 fL   MCH 31.6  26.0 - 34.0 pg   MCHC 33.1  30.0 - 36.0 g/dL   RDW 82.9  56.2 - 13.0 %   Platelets 188  150 - 400 K/uL  GLUCOSE, CAPILLARY     Status: Abnormal   Collection Time    03/10/13  6:11 AM      Result Value Range   Glucose-Capillary 166 (*) 70 - 99 mg/dL    Comment 1 Notify RN     Comment 2 Documented in Chart    GLUCOSE, CAPILLARY     Status: Abnormal   Collection Time    03/10/13 11:23 AM      Result Value Range   Glucose-Capillary 156 (*) 70 - 99 mg/dL  OCCULT BLOOD X 1 CARD TO LAB, STOOL     Status: Abnormal   Collection Time    03/10/13  3:05 PM      Result Value Range   Fecal Occult Bld POSITIVE (*) NEGATIVE  GLUCOSE, CAPILLARY     Status: Abnormal   Collection Time    03/10/13  4:07 PM      Result Value Range   Glucose-Capillary 157 (*) 70 - 99 mg/dL    Signed: Dow Adolph 03/11/2013, 5:29 AM   Time Spent on Discharge: One and a half hours Services Ordered on Discharge: Home health aide and nurse Equipment Ordered on Discharge: None

## 2013-03-10 NOTE — Progress Notes (Signed)
VASCULAR LAB PRELIMINARY  ARTERIAL  ABI completed:ABIs within normal limits.  Both arms are restricted.  Used blood pressure reported from TEE.    RIGHT    LEFT    PRESSURE WAVEFORM  PRESSURE WAVEFORM  BRACHIAL DVT  BRACHIAL restricted   DP   DP    AT 157 B AT 142 B  PT 158 B PT 157 B  PER   PER    GREAT TOE  NA GREAT TOE  NA    RIGHT LEFT  ABI >1.0 >1.0     Jennifer [name], RVT 03/10/2013, 11:10 AM

## 2013-03-10 NOTE — Consult Note (Signed)
Reason for Consult: Chronic ulcers bilateral feet Referring Physician: Dr. [name] is an 52 y.o. female.  HPI: Patient is a 52 year old woman with peripheral vascular disease diabetes venous stasis insufficiency with chronic ulcerations on the plantar aspect of both feet. She is currently undergoing IV antibiotics through a PICC line and home. Patient presents at this time stating that she smelled an increased odor on both feet.  Past Medical History  Diagnosis Date  . Depression   . GERD (gastroesophageal reflux disease)   . Hyperlipidemia   . Hypertension   . Glaucoma   . PVD (peripheral vascular disease) with claudication     Stents to bilateral common iliac arteries, on chronic plavix  . Tinnitus   . Diabetic peripheral neuropathy   . Fibromyalgia   . Hyperplastic colon polyp 12/2010    Per colonoscopy (12/2010) - Dr. Arlyce Dice  . Diverticulosis   . Asthma   . COPD 01/08/2007    PFT's 05/2007 : FEV1/FVC 82, FEV1 64% pred, FEF 25-75% 40% predicted, 16% improvement in FEV1 with bronchodilators.     . Type II diabetes mellitus with peripheral circulatory disorders, uncontrolled (250.72) DX: 1993    Insulin dep. Poor control. Complicated by diabetic foot ulcer and diabetic eye disease.    . Chronic pain syndrome 12/03/2011    Likely secondary to depression, "fibromyalgia", neuropathy, and obesity.    . Tobacco abuse   . Chronic osteomyelitis of foot     chronic, right secondary to diabetic foot ulcers  . Polymicrobial bacterial infection 01/2013    GBS and S. aureus bacteremia // Source likely infected diabetic foot ulcer  . 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 Shortness of breath   . Chronic diastolic heart failure     grade 2 per 2D echocardiogram (01/2013)  . Ulcer of foot, chronic     Left. No OM per MRI (01/2013)    Past Surgical History  Procedure  Laterality Date  . Abdominal hysterectomy  1997    secondary to uterine fibroids  . Tubal ligation    . Rotator cuff repair      bilateral  . Breast biopsy      multiple-benign per pt  . Wrist surgery      right  . Ganglion cyst excision      multiple  . Other surgical history      stents in lower ext  . Tee without cardioversion N/A 01/31/2013    Procedure: TRANSESOPHAGEAL ECHOCARDIOGRAM (TEE);  Surgeon: Pricilla Riffle, MD;  Location: [facility] ENDOSCOPY;  Service: Cardiovascular;  Laterality: N/A;  Rm [phone]  . Bladder surgery      bladder reconstruction surgery    Family History  Problem Relation Age of Onset  . Diverticulosis Mother   . Diabetes Mother   . Hypertension Mother   . Congestive Heart Failure Mother   . Asthma Father     Social History:  reports that she has been smoking Cigarettes.  She has a 60 pack-year smoking history. Her smokeless tobacco use includes Snuff. She reports that she does not drink alcohol or use illicit drugs.  Allergies:  Allergies  Allergen Reactions  . Iohexol      Desc: IV CONTRAST CAUSE NEPHROPATHY IN 2007   . Morphine Sulfate Itching and Rash    Medications: I have reviewed the patient's current medications.  Results for orders placed during the hospital encounter of 03/09/13 (from the past 48 hour(s))  GLUCOSE, CAPILLARY     Status: Abnormal   Collection Time    03/09/13 10:21 AM      Result Value Range   Glucose-Capillary 173 (*) 70 - 99 mg/dL   Comment 1 Documented in Chart     Comment 2 Notify RN    TROPONIN I     Status: None   Collection Time    03/09/13 10:57 AM      Result Value Range   Troponin I <0.30  <0.30 ng/mL   Comment:            Due to the release kinetics of cTnI,     a negative result within the first hours     of the onset of symptoms does not rule out     myocardial infarction with certainty.     If myocardial infarction is still suspected,     repeat the test at appropriate intervals.  COMPREHENSIVE  METABOLIC PANEL     Status: Abnormal   Collection Time    03/09/13 11:05 AM      Result Value Range   Sodium 136  135 - 145 mEq/L   Potassium 3.8  3.5 - 5.1 mEq/L   Chloride 97  96 - 112 mEq/L   CO2 28  19 - 32 mEq/L   Glucose, Bld 197 (*) 70 - 99 mg/dL   BUN 11  6 - 23 mg/dL   Creatinine, Ser 9.14  0.50 - 1.10 mg/dL   Calcium 9.0  8.4 - 78.2 mg/dL   Total Protein 7.1  6.0 - 8.3 g/dL   Albumin 3.2 (*) 3.5 - 5.2 g/dL   AST 65 (*) 0 - 37 U/L   ALT 10  0 - 35 U/L   Alkaline Phosphatase 81  39 - 117 U/L   Total Bilirubin 0.2 (*) 0.3 - 1.2 mg/dL   GFR calc non Af Amer >90  >90 mL/min   GFR calc Af Amer >90  >90 mL/min   Comment:            The eGFR has been calculated     using the CKD EPI equation.     This calculation has not been     validated in all clinical     situations.     eGFR's persistently     <90 mL/min signify     possible Chronic Kidney Disease.  CBC WITH DIFFERENTIAL     Status: Abnormal   Collection Time    03/09/13 11:05 AM      Result Value Range   WBC 8.0  4.0 - 10.5 K/uL   RBC 3.79 (*) 3.87 - 5.11 MIL/uL   Hemoglobin 12.0  12.0 - 15.0 g/dL   HCT 95.6  21.3 - 08.6 %   MCV 98.2  78.0 - 100.0 fL   MCH 31.7  26.0 - 34.0 pg   MCHC 32.3  30.0 - 36.0 g/dL   RDW 57.8  46.9 - 62.9 %   Platelets 223  150 - 400 K/uL   Neutrophils Relative 50  43 - 77 %   Neutro Abs 4.0  1.7 - 7.7 K/uL   Lymphocytes Relative 33  12 - 46 %   Lymphs Abs 2.7  0.7 - 4.0 K/uL   Monocytes Relative 7  3 - 12 %   Monocytes Absolute 0.5  0.1 - 1.0 K/uL   Eosinophils  Relative 10 (*) 0 - 5 %   Eosinophils Absolute 0.8 (*) 0.0 - 0.7 K/uL   Basophils Relative 1  0 - 1 %   Basophils Absolute 0.1  0.0 - 0.1 K/uL  LIPASE, BLOOD     Status: None   Collection Time    03/09/13 11:05 AM      Result Value Range   Lipase 26  11 - 59 U/L  PROTIME-INR     Status: Abnormal   Collection Time    03/09/13 11:05 AM      Result Value Range   Prothrombin Time 15.4 (*) 11.6 - 15.2 seconds   INR 1.24   0.00 - 1.49  GLUCOSE, CAPILLARY     Status: Abnormal   Collection Time    03/09/13  4:45 PM      Result Value Range   Glucose-Capillary 305 (*) 70 - 99 mg/dL   Comment 1 Documented in Chart     Comment 2 Notify RN    GLUCOSE, CAPILLARY     Status: Abnormal   Collection Time    03/09/13  7:49 PM      Result Value Range   Glucose-Capillary 236 (*) 70 - 99 mg/dL   Comment 1 Notify RN     Comment 2 Documented in Chart    GLUCOSE, CAPILLARY     Status: Abnormal   Collection Time    03/09/13  9:04 PM      Result Value Range   Glucose-Capillary 213 (*) 70 - 99 mg/dL   Comment 1 Notify RN     Comment 2 Documented in Chart    CBC     Status: Abnormal   Collection Time    03/10/13  6:00 AM      Result Value Range   WBC 8.2  4.0 - 10.5 K/uL   RBC 3.45 (*) 3.87 - 5.11 MIL/uL   Hemoglobin 10.9 (*) 12.0 - 15.0 g/dL   HCT 16.1 (*) 09.6 - 04.5 %   MCV 95.4  78.0 - 100.0 fL   MCH 31.6  26.0 - 34.0 pg   MCHC 33.1  30.0 - 36.0 g/dL   RDW 40.9  81.1 - 91.4 %   Platelets 188  150 - 400 K/uL  GLUCOSE, CAPILLARY     Status: Abnormal   Collection Time    03/10/13  6:11 AM      Result Value Range   Glucose-Capillary 166 (*) 70 - 99 mg/dL   Comment 1 Notify RN     Comment 2 Documented in Chart      No results found.  Review of Systems  All other systems reviewed and are negative.   Blood pressure 140/68, pulse 74, temperature 98 F (36.7 C), temperature source Oral, resp. rate 20, height 5\' 7"  (1.702 m), weight 127.7 kg (281 lb 8.4 oz), SpO2 97.00%. Physical Exam On examination patient has venous stasis changes in both legs with brawny skin color changes. There is no cellulitis in her legs. She has chronic ulcers on the plantar aspect of both feet which measure proximally 6 cm in diameter in or about 5 mm deep. There is an eschar overlying the ulcers. There is no cellulitis minimal amount of drainage. There is good epithelialization around the wound edges with evidence of interval wound  healing.  Assessment/Plan: Assessment: Chronic insensate neuropathic ulcers bilateral feet #2 venous stasis insufficiency bilateral lower extremities.  Plan: Recommend a Santyl dressing changes daily minimize weightbearing elevation both legs.  Patient safe for discharge to home with her to continue her IV antibiotics. I will followup in the office in 2 weeks.  Jennifer [name] V 03/10/2013, 7:06 AM

## 2013-03-10 NOTE — Interval H&P Note (Deleted)
History and Physical Interval Note:  03/10/2013 9:12 AM  Jennifer [name]  has presented today for surgery, with the diagnosis of Endocarditis  The various methods of treatment have been discussed with the patient and family. After consideration of risks, benefits and other options for treatment, the patient has consented to  Procedure(s) with comments: TRANSESOPHAGEAL ECHOCARDIOGRAM (TEE) (N/A) - Rm. 4730 as a surgical intervention .  The patient's history has been reviewed, patient examined, no change in status, stable for surgery.  I have reviewed the patient's chart and labs.  Questions were answered to the patient's satisfaction.     Deauna Yaw [employer]

## 2013-03-10 NOTE — H&P (Signed)
Internal Medicine Attending Admission Note Date: 03/10/2013  Patient name: Jennifer [name] Medical record number: [id number] Date of birth: 05/17/1961 Age: 52 y.o. Gender: female  I saw and evaluated the patient. I reviewed the resident's note and I agree with the resident's findings and plan as documented in the resident's note, with the following additional comments.  Chief Complaint(s): Bilateral plantar foot ulcers with drainage from the left foot lesion  History - key components related to admission: Patient is a 52 year old woman with history of type 2 diabetes mellitus, infective endocarditis, diabetic foot ulcers, osteomyelitis, and other problems as outlined in the medical history, admitted through the ID clinic with drainage from left foot ulcer and recent subjective fevers/chills.  Patient was hospitalized 01/25/2013 through 02/01/2013 with endocarditis and bacteremia (Staph aureus and Group B strep).  She has been receiving outpatient IV Ancef managed by Dr. Daiva Eves, and her foot ulcer care has been provided by Dr. Lajoyce Corners at the wound clinic.  She saw Dr. Daiva Eves on the day of admission, and he was concerned about copious drainage from the left foot ulcer and other symptoms, and arranged admission to the hospital for evaluation and treatment.   Physical Exam - key components related to admission:  Filed Vitals:   03/10/13 0950 03/10/13 0955 03/10/13 1000 03/10/13 1129  BP: 144/65 142/68 141/67 147/63  Pulse:    64  Temp:    97.8 F (36.6 C)  TempSrc:    Oral  Resp: 15 14 15 18   Height:      Weight:      SpO2: 97% 99% 97% 98%   General: Alert, no distress Lungs: Clear Heart: Regular; no extra sounds or murmurs Abdomen: Bowel sounds present, soft, nontender Extremities: There bilateral plantar ulcers with eschar present; there is some drainage from the left foot ulcer.  There is no appreciable tenderness or swelling of the right upper extremity.   Lab results:   Basic  Metabolic Panel:  Recent Labs  78/29/56 1105 03/10/13 0600  NA 136 140  K 3.8 3.8  CL 97 101  CO2 28 30  GLUCOSE 197* 180*  BUN 11 10  CREATININE 0.62 0.62  CALCIUM 9.0 8.9   Liver Function Tests:  Recent Labs  03/09/13 1105 03/10/13 0600  AST 65* 42*  ALT 10 7  ALKPHOS 81 70  BILITOT 0.2* 0.2*  PROT 7.1 6.4  ALBUMIN 3.2* 2.8*    Recent Labs  03/09/13 1105  LIPASE 26    CBC:  Recent Labs  03/09/13 1105 03/10/13 0600  WBC 8.0 8.2  NEUTROABS 4.0  --   HGB 12.0 10.9*  HCT 37.2 32.9*  MCV 98.2 95.4  PLT 223 188   Cardiac Enzymes:  Recent Labs  03/09/13 1057  TROPONINI <0.30    CBG:  Recent Labs  03/09/13 1021 03/09/13 1645 03/09/13 1949 03/09/13 2104 03/10/13 0611 03/10/13 1123  GLUCAP 173* 305* 236* 213* 166* 156*    Coagulation:  Recent Labs  03/09/13 1105  INR 1.24     Imaging results:  Mr Foot Right W Wo Contrast  03/10/2013  *RADIOLOGY REPORT*  Clinical Data: Chronic osteomyelitis of the right foot.  Discharge from right  plantar soft tissue ulceration.  MRI OF THE RIGHT FOREFOOT WITHOUT AND WITH CONTRAST  Technique:  Multiplanar, multisequence MR imaging was performed both before and after administration of intravenous contrast.  Contrast: 20mL MULTIHANCE GADOBENATE DIMEGLUMINE 529 MG/ML IV SOLN  Comparison: MRI dated 01/27/2013  Findings: There has  been no significant change in the appearance of the right foot since the prior exam.  No evidence of active osteomyelitis, soft tissue abscess, or new joint effusions.  No new pathologic enhancement after contrast administration  Subcutaneous edema is diffusely more prominent in the foot.  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  IMPRESSION: Increased subcutaneous edema in the forefoot. No  significant change since the prior exam.  No evidence of active osteomyelitis or soft tissue abscess.   Original Report Authenticated By: Francene Boyers, M.D.    Mr Foot Left W Wo Contrast  03/10/2013   *RADIOLOGY REPORT*  Clinical Data: Chronic plantar soft tissue ulceration of the left foot.  New soft tissue ulcer on the lateral aspect of the great toe.  MRI OF THE LEFT FOREFOOT WITHOUT AND WITH CONTRAST  Technique:  Multiplanar, multisequence MR imaging was performed both before and after administration of intravenous contrast.  Contrast: 20mL MULTIHANCE GADOBENATE DIMEGLUMINE 529 MG/ML IV SOLN  Comparison: MRI dated 01/27/2013  Findings: There is no evidence of osteomyelitis of the forefoot. There are no soft tissue abscesses or significant joint effusions. There is increased dorsal subcutaneous soft tissue edema.  IMPRESSION:  1.  Increased dorsal soft tissue edema in the forefoot. 2.  No evidence of osteomyelitis or soft tissue abscess. 3.  No pathologic enhancement after contrast administration.   Original Report Authenticated By: Francene Boyers, M.D.      Assessment & Plan by Problem:  1. Bilateral diabetic foot ulcers.  MRI shows no evidence of osteomyelitis.  Patient has been seen by D. Lajoyce Corners, who plans outpatient debridement at the wound clinic.  We need to discuss with Dr. Daiva Eves whether or he would like to continue intravenous outpatient antibiotics.  2.  Bacteremia/endocarditis.  A transesophageal echocardiogram today showed no evidence of endocarditis.  Blood cultures were drawn and are pending.  As above, need to discuss antibiotic plans with Dr. Daiva Eves.  3.  Right upper extremity deep venous thrombosis.  A duplex study done today of the right upper extremity showed non-occlusive DVT in the right brachial vein and SVT noted in the right basilic vein.  Patient had a PICC catheter in that arm which was removed last week.  According to the current ACCP guidelines, uncertainty exists about the need to prescribe anticoagulants to patients with thrombosis confined to the brachial vein with no involvement of the axillary or more proximal veins; the guidelines do favor anticoagulation if the  thrombosis is symptomatic. Would discuss this further with Dr. Daiva Eves and with patient.  Oral anticoagulation may make debridement more difficult, since would it would need to be taken into consideration.  4.  Other problems as per the resident physician's note.

## 2013-03-10 NOTE — Progress Notes (Signed)
Client was given discharge instructions, verbalized understanding.  Heart Failure booklet given to client for reading, she will call with questions.  Pi[facility] line discontinued and client flat  for 30 minutes prior to release.  Dressing to IV line site dry and intact.  Dressings remain intact to both feet.

## 2013-03-10 NOTE — Progress Notes (Signed)
Received call from [name] LCSW in the Out Patient Clinic.  Request made to writer for transportation assistance.  Client has used [facility] for medical appointment transportation in the past.  She indicated that she pays $1.50 each way, but currently she can not afford to pay.  A [facility] LCSW consult for transportation has been submitted and we will follow up with patient while she is still hospitalized if possible.  Also, patient would like some additional information about how to get patches for smoking cessation.  Will notify Lovette Cliche Inpatient LCSW of these request and current plan.  Of note, [facility] Care Management services does not replace or interfere with any services that are arranged by inpatient case management or social work.  For additional questions or referrals please contact Anibal Henderson BSN RN [facility] Liaison at [phone].

## 2013-03-10 NOTE — Progress Notes (Signed)
Subjective: No overnight events. Discussed with the patient's about the risks and benefits of Coumadin treatment for her new deep venous thrombosis in the right upper extremity. She once to proceed with Coumadin therapy. Her main concern is of course to medication and transportation for INR checks. Objective: Vital signs in last 24 hours: Filed Vitals:   03/10/13 0955 03/10/13 1000 03/10/13 1129 03/10/13 1530  BP: 142/68 141/67 147/63 159/91  Pulse:   64 72  Temp:   97.8 F (36.6 C) 97.9 F (36.6 C)  TempSrc:   Oral Oral  Resp: 14 15 18 18   Height:      Weight:      SpO2: 99% 97% 98% 100%   Weight change:   Intake/Output Summary (Last 24 hours) at 03/10/13 1605 Last data filed at 03/10/13 1500  Gross per 24 hour  Intake    600 ml  Output    700 ml  Net   -100 ml    Physical examination: General:  Vital signs reviewed and noted. Well-developed, well-nourished, in no acute distress; alert, appropriate and cooperative throughout examination. Her son is at bedside.  No deformities, masses, or tenderness noted. Supple, no JVD.  Lungs:  Normal respiratory effort. Clear to auscultation BL without crackles or wheezes.  Heart:  RRR. S1 and S2 normal without gallop, murmur, or rubs.  Abdomen:  BS normoactive. Soft, Nondistended, non-tender. No masses or organomegaly.  Extremities: No pretibial edema. Bilateral feet with large plantar open ulcers, approximately 6cm in diameter. No significant drainage noted, however on left lower extremity malodor is noted. Granulation tissue noted.  Left great toe - small subcentimeter blackened blister no lateral lower edge of toe.  Decreased DP pulse on left, strong pulse on right.  Neurologic:  A&O X3, CN II - XII are grossly intact. Motor strength is 5/5 in the all 4 extremities. Lab Results: Basic Metabolic Panel:  Recent Labs Lab 03/09/13 1105 03/10/13 0600  NA 136 140  K 3.8 3.8  CL 97 101  CO2 28 30  GLUCOSE 197* 180*  BUN 11 10   CREATININE 0.62 0.62  CALCIUM 9.0 8.9   Liver Function Tests:  Recent Labs Lab 03/09/13 1105 03/10/13 0600  AST 65* 42*  ALT 10 7  ALKPHOS 81 70  BILITOT 0.2* 0.2*  PROT 7.1 6.4  ALBUMIN 3.2* 2.8*    Recent Labs Lab 03/09/13 1105  LIPASE 26  CBC:  Recent Labs Lab 03/09/13 1105 03/10/13 0600  WBC 8.0 8.2  NEUTROABS 4.0  --   HGB 12.0 10.9*  HCT 37.2 32.9*  MCV 98.2 95.4  PLT 223 188   Cardiac Enzymes:  Recent Labs Lab 03/09/13 1057  TROPONINI <0.30  CBG:  Recent Labs Lab 03/09/13 1021 03/09/13 1645 03/09/13 1949 03/09/13 2104 03/10/13 0611 03/10/13 1123  GLUCAP 173* 305* 236* 213* 166* 156*   Coagulation:  Recent Labs Lab 03/09/13 1105  LABPROT 15.4*  INR 1.24     Urine Drug Screen: Drugs of Abuse     Component Value Date/Time   LABOPIA NONE DETECTED 01/26/2013 0036   LABOPIA SEE NOTE 07/21/2012 1631   COCAINSCRNUR NONE DETECTED 01/26/2013 0036   COCAINSCRNUR NEG 07/21/2012 1631   LABBENZ NONE DETECTED 01/26/2013 0036   LABBENZ NEG 07/21/2012 1631   LABBENZ NEG 06/28/2010 2130   AMPHETMU NONE DETECTED 01/26/2013 0036   AMPHETMU NEG 06/28/2010 2130   THCU NONE DETECTED 01/26/2013 0036   LABBARB NONE DETECTED 01/26/2013 0036   LABBARB NEG 07/21/2012 1631  Micro Results: Recent Results (from the past 240 hour(s))  CULTURE, BLOOD (ROUTINE X 2)     Status: None   Collection Time    03/09/13 11:05 AM      Result Value Range Status   Specimen Description BLOOD RIGHT ARM   Final   Special Requests BOTTLES DRAWN AEROBIC AND ANAEROBIC 10CC   Final   Culture  Setup Time 03/09/2013 15:20   Final   Culture     Final   Value:        BLOOD CULTURE RECEIVED NO GROWTH TO DATE CULTURE WILL BE HELD FOR 5 DAYS BEFORE ISSUING A FINAL NEGATIVE REPORT   Report Status PENDING   Incomplete  CULTURE, BLOOD (ROUTINE X 2)     Status: None   Collection Time    03/09/13 11:10 AM      Result Value Range Status   Specimen Description BLOOD RIGHT HAND   Final   Special  Requests BOTTLES DRAWN AEROBIC ONLY 10CC   Final   Culture  Setup Time 03/09/2013 15:20   Final   Culture     Final   Value:        BLOOD CULTURE RECEIVED NO GROWTH TO DATE CULTURE WILL BE HELD FOR 5 DAYS BEFORE ISSUING A FINAL NEGATIVE REPORT   Report Status PENDING   Incomplete   Studies/Results: Mr Foot Right W Wo Contrast  03/10/2013  *RADIOLOGY REPORT*  Clinical Data: Chronic osteomyelitis of the right foot.  Discharge from right  plantar soft tissue ulceration.  MRI OF THE RIGHT FOREFOOT WITHOUT AND WITH CONTRAST  Technique:  Multiplanar, multisequence MR imaging was performed both before and after administration of intravenous contrast.  Contrast: 20mL MULTIHANCE GADOBENATE DIMEGLUMINE 529 MG/ML IV SOLN  Comparison: MRI dated 01/27/2013  Findings: There has been no significant change in the appearance of the right foot since the prior exam.  No evidence of active osteomyelitis, soft tissue abscess, or new joint effusions.  No new pathologic enhancement after contrast administration  Subcutaneous edema is diffusely more prominent in the foot.  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  IMPRESSION: Increased subcutaneous edema in the forefoot. No  significant change since the prior exam.  No evidence of active osteomyelitis or soft tissue abscess.   Original Report Authenticated By: Francene Boyers, M.D.    Mr Foot Left W Wo Contrast  03/10/2013  *RADIOLOGY REPORT*  Clinical Data: Chronic plantar soft tissue ulceration of the left foot.  New soft tissue ulcer on the lateral aspect of the great toe.  MRI OF THE LEFT FOREFOOT WITHOUT AND WITH CONTRAST  Technique:  Multiplanar, multisequence MR imaging was performed both before and after administration of intravenous contrast.  Contrast: 20mL MULTIHANCE GADOBENATE DIMEGLUMINE 529 MG/ML IV SOLN  Comparison: MRI dated 01/27/2013  Findings: There is no evidence of osteomyelitis of the forefoot. There are no soft tissue abscesses or significant joint  effusions. There is increased dorsal subcutaneous soft tissue edema.  IMPRESSION:  1.  Increased dorsal soft tissue edema in the forefoot. 2.  No evidence of osteomyelitis or soft tissue abscess. 3.  No pathologic enhancement after contrast administration.   Original Report Authenticated By: Francene Boyers, M.D.    Medications: I have reviewed the patient's current medications. Scheduled Meds: . amoxicillin-clavulanate  1 tablet Oral Q12H  . aspirin  81 mg Oral Daily  . atenolol  100 mg Oral Daily  . buPROPion  150 mg Oral BID  . collagenase   Topical Daily  .  coumadin book   Does not apply Once  . enoxaparin (LOVENOX) injection  40 mg Subcutaneous Q24H  . fluticasone  1 spray Each Nare BID  . insulin aspart  0-9 Units Subcutaneous TID WC  . insulin aspart protamine-insulin aspart  100 Units Subcutaneous Q breakfast  . insulin aspart protamine-insulin aspart  50 Units Subcutaneous Q supper  . mometasone-formoterol  2 puff Inhalation BID  . nicotine  21 mg Transdermal Daily  . pantoprazole  40 mg Oral Daily  . pregabalin  200 mg Oral TID  . simvastatin  20 mg Oral q1800  . sodium chloride  3 mL Intravenous Q12H  . warfarin  5 mg Oral ONCE-1800  . warfarin   Does not apply Once  . Warfarin - Pharmacist Dosing Inpatient   Does not apply q1800   Continuous Infusions: . sodium chloride 20 mL/hr at 03/09/13 2015   PRN Meds:.albuterol, butamben-tetracaine-benzocaine, fentaNYL, midazolam, oxyCODONE-acetaminophen, sodium chloride Assessment/Plan: Pt is a 52 y.o. yo female with a PMHx of uncontrolled DM2 with chronic right foot osteomyelitis and left foot ulcer, COPD with ongoing tobacco abuse, grade 2 diastolic heart failure, and recent prolonged admission in 01/2013 with GBS/S.aureus bacteremia, infective endocarditis, and questionable tertiary chordae tendinae rupture - who was admitted on 03/09/2013 with symptoms of increased left foot drainage with new foot lesion. Given recent endocarditis  will need to evaluate for possible new embolization.   Bilateral plantar ulcers:  She was admitted with a concern of recent increased malodorous discharge from her left plantar foot ulcer, despite outpatient treatment with Ancef. During her ID clinic visit on 3/19, there was concern raised about osteomyelitis. She denies any symptoms of fevers, chills, or vomiting. No increased fatigue. Bilateral foot MRIs 03/09/2013 - negative for osteomyelitis. Repeat blood cultures - no growth to date. Dr. Lajoyce Corners evaluated the patient during her hospitalization and recommended outpatient debridement. An appointment was setup to see the patient on Monday, 03/14/2013 for further care. Patient was discharged with Santyl cream to apply in the interim period. Infectious disease recommended removal of her PICC line and switch antibiotics to Augmentin for one month. The patient will see Dr. Zenaida Niece dam on 03/17/2013.   Plan:  - Discharge home with outpatient followup with Dr. Lajoyce Corners and Dr. Zenaida Niece dam.    Asymptomatic right upper extremity Nonocclusive DVT: Secondary to a previous PICC line. Right upper extremity venous Doppler performed on the  03/10/2013 revealed a nonocclusive DVT in the right brachial vein. This was the site of her previous PICC line, which was changed to the left side, following some complications with erythema and pain. There was also noted superficial venous thrombosis in the right basilic vein. After discussing with the patient about the benefits, and risks of Coumadin therapy, the patient decided to pursue treatment. After consultation with Dr. Alexandria Lodge, She was initiated on Coumadin, and the plan will be to treat her for 3 months and then stop. Fortunately, this DVT is asymptomatic at this time and the option of not treating it was also reasonable. Coumadin was dosed by pharmacy, and the patient will followup with Dr. Alexandria Lodge in 4 days.  Peripheral vascular disease: When the patient was evaluated in the office at the  infectious diseases clinic, there was a concern of reduced pulses, especially in the left extremity. She had Bilat iliac stents placed ( Right in 2008 - Dr Darrick Penna; left by IR 2005). ABIs in 01/2013 were more than 0.9 blaterally. During hospital stay, she was evaluated by a vasculr  surgery, who recommended no further treatment as per circulation in the lower extremities is good. Discussed with Dr. Darrick Penna, regarding discontinuation of Plavix in view of her new Coumadin therapy, and he was in agreement that this medication can be discontinued.   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 Concern for new [place] lesions , which been noted at Infectious disease clinic by Dr. Zenaida Niece dam and therefore, the patient was admitted for further evaluation. She had completed a 6 weeks course of IV Ancef. Blood cultures 01/26/13 blood cultures 2/2 S. Aureus and GBS. 2/6 & 2/8 & 01/30/2013 Blood cultures x 2 negative. Recheck blood cultures were pending at time of discharge. A TEE was repeated and it was negative for vegetations. She was discharged on a 1 month course of oral Augmentin as recommended by Dr. Daiva Eves of infectious diseases.  Type II diabetes mellitus with peripheral circulatory disorders, uncontrolled  - continued reduced dose of home medication once eating - Novolog 100 UNITS qAM and 50 in PM   Hyperlipidemia - she has not been on her Pravastatin - this medication was sent to the wrong pharmacy. Last LDL 109 in 06/2012.  - Restarted statin   Depression - not well controlled. Recently was changed to Wellbutrin 1 week prior to admission - therefore, full effect not expected.  - Continued home wellbutrin.   Hypertension - BP stable on admission. She continued with atenolol. We held her thiazide and arm for the MRI with contrast. These were restarted on discharge.  COPD with ongoing  tobacco - without acute exacerbation  - Continue home albuterol/ advair (formulary substitution ok)  GERD - PPI.  Chronic pain syndrome - on home Lyrica and Percocet.  - Continue home meds.  Chronic diastolic heart failure without exacerbation  - Hold home ARB while awaiting MRI   Tobacco abuse - smokes 1.5 packs per day. Was supposed to start nicotine patches, but her insurance not covering.  - Nicotine patch during hospital course  DVT PPX - low molecular weight heparin  CODE STATUS - FULL CONSULTS PLACED - Orthopedics, cardiology DISPO - Disposition is deferred at this time, awaiting evaluation of bilateral diabetic ulcers.  Anticipated discharge in approximately 3-4 day(s).  The patient does have a current PCP Burns Spain, MD) and does need an [facility] hospital follow-up appointment after discharge.  Is the [facility] hospital follow-up appointment a one-time only appointment? no.  Does the patient have transportation limitations that hinder transportation to clinic appointments? unknown      LOS: 1 day   Dow Adolph PGY1 - Internal Medicine Teaching Service Pager: [phone] 03/10/2013, 4:05 PM

## 2013-03-11 ENCOUNTER — Encounter (HOSPITAL_COMMUNITY): Payer: Self-pay | Admitting: Cardiology

## 2013-03-11 ENCOUNTER — Other Ambulatory Visit: Payer: Self-pay | Admitting: *Deleted

## 2013-03-11 DIAGNOSIS — I82629 Acute embolism and thrombosis of deep veins of unspecified upper extremity: Secondary | ICD-10-CM | POA: Diagnosis present

## 2013-03-11 HISTORY — DX: Acute embolism and thrombosis of deep veins of unspecified upper extremity: I82.629

## 2013-03-11 MED ORDER — OXYCODONE-ACETAMINOPHEN 7.5-325 MG PO TABS: 1.0000 | ORAL_TABLET | Freq: Four times a day (QID) | ORAL | Status: DC | PRN

## 2013-03-11 NOTE — Telephone Encounter (Signed)
Last Rx received 2/20 per pharmacy.

## 2013-03-11 NOTE — Addendum Note (Signed)
Addended by: Blanch Media A on: 03/11/2013 02:24 PM   Modules accepted: Orders

## 2013-03-14 ENCOUNTER — Ambulatory Visit (INDEPENDENT_AMBULATORY_CARE_PROVIDER_SITE_OTHER): Payer: Medicare Other | Admitting: Pharmacist

## 2013-03-14 ENCOUNTER — Encounter: Payer: Self-pay | Admitting: Internal Medicine

## 2013-03-14 DIAGNOSIS — I82629 Acute embolism and thrombosis of deep veins of unspecified upper extremity: Secondary | ICD-10-CM

## 2013-03-14 DIAGNOSIS — I82621 Acute embolism and thrombosis of deep veins of right upper extremity: Secondary | ICD-10-CM

## 2013-03-14 DIAGNOSIS — Z7901 Long term (current) use of anticoagulants: Secondary | ICD-10-CM

## 2013-03-14 LAB — POCT INR: INR: 2.1

## 2013-03-14 NOTE — Patient Instructions (Signed)
Patient instructed to take medications as defined in the Anti-coagulation Track section of this encounter.  Patient instructed to take today's dose.  Patient verbalized understanding of these instructions.    

## 2013-03-14 NOTE — Progress Notes (Signed)
Anti-Coagulation Progress Note  Jennifer [name] is a 52 y.o. female who is currently on an anti-coagulation regimen.    RECENT RESULTS: Recent results are below, the most recent result is correlated with a dose of 5mg  per day x 2 days; 2.5mg  --- all since discharge (12.5mg  over 3 days). Lab Results  Component Value Date   INR 2.10 03/14/2013   INR 1.24 03/09/2013   INR 1.1 10/21/2007    ANTI-COAG DOSE: Anticoagulation Dose Instructions as of 03/14/2013     [name] Tue Wed Thu Fri Sat   New Dose 5 mg 5 mg 5 mg 2.5 mg 5 mg 5 mg 5 mg       ANTICOAG SUMMARY: Anticoagulation Episode Summary   Current INR goal 2.0-3.0  Next INR check 03/21/2013  INR from last check 2.10 (03/14/2013)  Weekly max dose   Target end date 06/14/2013  [facility]  Preferred lab   Send INR reminders to    Indications  DVT of upper extremity (deep vein thrombosis) [453.82] Long term (current) use of anticoagulants [V58.61]        Comments         ANTICOAG TODAY: Anticoagulation Summary as of 03/14/2013   INR goal 2.0-3.0  Selected INR 2.10 (03/14/2013)  Next INR check 03/21/2013  Target end date 06/14/2013   Indications  DVT of upper extremity (deep vein thrombosis) [453.82] Long term (current) use of anticoagulants [V58.61]      Anticoagulation Episode Summary   [facility]   Preferred lab    Send INR reminders to    Comments       PATIENT INSTRUCTIONS: Patient Instructions  Patient instructed to take medications as defined in the Anti-coagulation Track section of this encounter.  Patient instructed to take today's dose.  Patient verbalized understanding of these instructions.       FOLLOW-UP Return in 7 days (on 03/21/2013) for Follow up INR at 1030h.  Hulen Luster, III Pharm.D., CACP

## 2013-03-15 LAB — CULTURE, BLOOD (ROUTINE X 2)
Culture: NO GROWTH
Culture: NO GROWTH

## 2013-03-17 ENCOUNTER — Ambulatory Visit (INDEPENDENT_AMBULATORY_CARE_PROVIDER_SITE_OTHER): Payer: Medicare Other | Admitting: Infectious Disease

## 2013-03-17 ENCOUNTER — Encounter: Payer: Self-pay | Admitting: Infectious Disease

## 2013-03-17 ENCOUNTER — Other Ambulatory Visit: Payer: Self-pay | Admitting: Licensed Clinical Social Worker

## 2013-03-17 VITALS — BP 180/83 | HR 58 | Temp 98.1°F

## 2013-03-17 DIAGNOSIS — I82629 Acute embolism and thrombosis of deep veins of unspecified upper extremity: Secondary | ICD-10-CM

## 2013-03-17 DIAGNOSIS — I82621 Acute embolism and thrombosis of deep veins of right upper extremity: Secondary | ICD-10-CM

## 2013-03-17 DIAGNOSIS — M9080 Osteopathy in diseases classified elsewhere, unspecified site: Secondary | ICD-10-CM

## 2013-03-17 DIAGNOSIS — T8130XA Disruption of wound, unspecified, initial encounter: Secondary | ICD-10-CM

## 2013-03-17 DIAGNOSIS — B951 Streptococcus, group B, as the cause of diseases classified elsewhere: Secondary | ICD-10-CM

## 2013-03-17 DIAGNOSIS — I330 Acute and subacute infective endocarditis: Secondary | ICD-10-CM

## 2013-03-17 DIAGNOSIS — L97509 Non-pressure chronic ulcer of other part of unspecified foot with unspecified severity: Secondary | ICD-10-CM

## 2013-03-17 DIAGNOSIS — A4901 Methicillin susceptible Staphylococcus aureus infection, unspecified site: Secondary | ICD-10-CM

## 2013-03-17 DIAGNOSIS — E1169 Type 2 diabetes mellitus with other specified complication: Secondary | ICD-10-CM

## 2013-03-17 DIAGNOSIS — A491 Streptococcal infection, unspecified site: Secondary | ICD-10-CM

## 2013-03-17 DIAGNOSIS — E11621 Type 2 diabetes mellitus with foot ulcer: Secondary | ICD-10-CM

## 2013-03-17 DIAGNOSIS — M869 Osteomyelitis, unspecified: Secondary | ICD-10-CM

## 2013-03-17 MED ORDER — COLLAGENASE 250 UNIT/GM EX OINT: TOPICAL_OINTMENT | Freq: Every day | CUTANEOUS | Status: DC

## 2013-03-17 NOTE — Progress Notes (Signed)
Subjective:    [name], female    DOB: 1961/11/12, 52 y.o.   MRN: [id number]  HPI   52 year old African American lady with diabetes peripheral neuropathy and chronic foot ulcers who was admitted to my service earlier in February 2014 with polymicrobial bacteremia with MSSA/GBS in 2/2 cultures and discolored feet, worsening of her ulcers. She had MRI which showed possible chronic osteo 4th phalanx, no abscess. Dr. Lajoyce Corners saw pt and performed bedside debridement of tissue of right foot. TEE showed MV vegetation with possible  tendinae chordae rupture and TV vegetations . Patient was seen by Dr Donata Clay with CVTS as well. Patient cleared his bacteremia by the 6th of February and was narrowed to IV ancef 2g IV q 8 hours with plans for at least 6 weeks.  I saw her roughly in late February and then again last week when she had  with erythema pain in left arm and bleeding from PICC site. She was found to have superficial thrombi on that site but no new DVT.  PICC removed, new one inserted in the opposite arm.  Since then she has had several things occur  She has a new black painless raised lesion on big toe that developed 10 days ago but which I did not know about or see last week-  She developed acute onset on last Monday of nausea with vomiting and subjective fevers--which she attributed to viral illness  she has had copious drainage of foul smelling material from her left foot ulcer where skin is coming loose from ulcer bed.  Had her admitted to the hospital to the service.  MRI done of the right and left foot were stable compared to prior exams. Patient was seen by Dr. Lajoyce Corners who recommended santyl to wounds.   He had a transesophageal echocardiogram performed which actually failed to show evidence of vegetations at this time.  Last seen by vascular surgery who felt no need for further workup of new black lesion on her toe and who reviewed her ABis and exam and felt no need  for further vascular workup or intervention.  Repeat Doppler of the upper extremity showed : Findings consistent with acute, non occlusivedeep vein thrombosis involving the brachial vein and superficial venous thrombosis of thebasilic vein of theright upper extremity.  Patient was treated with Lovenox and bridged to Coumadin is now on this and being followed by Jennifer [name].   I have her PICC line removed and had her placed on oral Augmentin twice daily which she remains on. Since discharge her feet have stabilized and the malodorous discharge from her right ulcer in particular has improved. He is without fevers nausea malaise or other systemic symptoms.   Review of Systems  Constitutional: Positive for fever, chills, activity change and fatigue. Negative for diaphoresis, appetite change and unexpected weight change.  HENT: Negative for congestion, sore throat, rhinorrhea, sneezing, trouble swallowing and sinus pressure.   Eyes: Negative for photophobia and visual disturbance.  Respiratory: Negative for cough, chest tightness, shortness of breath, wheezing and stridor.   Cardiovascular: Negative for chest pain, palpitations and leg swelling.  Gastrointestinal: Positive for nausea and vomiting. Negative for abdominal pain, diarrhea, constipation, blood in stool, abdominal distention and anal bleeding.  Genitourinary: Negative for dysuria, hematuria, flank pain and difficulty urinating.  Musculoskeletal: Positive for myalgias. Negative for back pain, joint swelling, arthralgias and gait problem.  Skin: Positive for wound. Negative for color change, pallor and rash.  Neurological: Negative  for dizziness, tremors, weakness and light-headedness.  Hematological: Negative for adenopathy. Does not bruise/bleed easily.  Psychiatric/Behavioral: Negative for behavioral problems, confusion, sleep disturbance, dysphoric mood, decreased concentration and agitation.       Objective:   Physical Exam    Constitutional: She is oriented to person, place, and time. She appears well-developed and well-nourished. No distress.  HENT:  Head: Normocephalic and atraumatic.  Mouth/Throat: Oropharynx is clear and moist. No oropharyngeal exudate.  Eyes: Conjunctivae and EOM are normal. Pupils are equal, round, and reactive to light. No scleral icterus.  Neck: Normal range of motion. Neck supple. No JVD present.  Cardiovascular: Normal rate, regular rhythm and normal heart sounds.  Exam reveals no gallop and no friction rub.   No murmur heard. Pulmonary/Chest: Effort normal and breath sounds normal. No respiratory distress. She has no wheezes. She has no rales. She exhibits no tenderness.  Abdominal: She exhibits no distension and no mass. There is no tenderness. There is no rebound and no guarding.  Musculoskeletal: She exhibits no edema and no tenderness.       Feet:  Lymphadenopathy:    She has no cervical adenopathy.  Neurological: She is alert and oriented to person, place, and time. She has normal reflexes. She exhibits normal muscle tone. Coordination normal.  Skin: Skin is warm and dry. She is not diaphoretic.     Psychiatric: She has a normal mood and affect. Her behavior is normal. Judgment and thought content normal.          Assessment & Plan:  Chronic Foot ulcers with concern for osteomyelitis at admission in February. Early MRI does not show clear-cut evidence for osteomyelitis. --Now going to continue her on oral Augmentin twice daily to complete a month of therapy with followup with [place] infectious disease.   New black lesion on her foot: This is stable and was worked up with a transesophageal echocardiogram and consult a vascular surgery.    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  Right sided  DVT: on coumadin and followed in [facility]

## 2013-03-21 ENCOUNTER — Ambulatory Visit (INDEPENDENT_AMBULATORY_CARE_PROVIDER_SITE_OTHER): Payer: Medicare Other | Admitting: Pharmacist

## 2013-03-21 DIAGNOSIS — Z7901 Long term (current) use of anticoagulants: Secondary | ICD-10-CM

## 2013-03-21 DIAGNOSIS — I82621 Acute embolism and thrombosis of deep veins of right upper extremity: Secondary | ICD-10-CM

## 2013-03-21 DIAGNOSIS — I82629 Acute embolism and thrombosis of deep veins of unspecified upper extremity: Secondary | ICD-10-CM

## 2013-03-21 LAB — POCT INR: INR: 4.1

## 2013-03-21 NOTE — Progress Notes (Signed)
Anti-Coagulation Progress Note  Jennifer [name] is a 52 y.o. female who is currently on an anti-coagulation regimen.    RECENT RESULTS: Recent results are below, the most recent result is correlated with a dose of 32.5 mg. per week: Lab Results  Component Value Date   INR 4.10 03/21/2013   INR 2.10 03/14/2013   INR 1.24 03/09/2013    ANTI-COAG DOSE: Anticoagulation Dose Instructions as of 03/21/2013     [name] Tue Wed Thu Fri Sat   New Dose 5 mg 2.5 mg 5 mg 2.5 mg 5 mg 2.5 mg 5 mg       ANTICOAG SUMMARY: Anticoagulation Episode Summary   Current INR goal 2.0-3.0  Next INR check 04/04/2013  INR from last check 4.10! (03/21/2013)  Weekly max dose   Target end date 06/14/2013  [facility]  Preferred lab   Send INR reminders to    Indications  DVT of upper extremity (deep vein thrombosis) [453.82] Long term (current) use of anticoagulants [V58.61]        Comments         ANTICOAG TODAY: Anticoagulation Summary as of 03/21/2013   INR goal 2.0-3.0  Selected INR 4.10! (03/21/2013)  Next INR check 04/04/2013  Target end date 06/14/2013   Indications  DVT of upper extremity (deep vein thrombosis) [453.82] Long term (current) use of anticoagulants [V58.61]      Anticoagulation Episode Summary   [facility] location [facility]   Preferred lab    Send INR reminders to    Comments       PATIENT INSTRUCTIONS: Patient Instructions  Patient instructed to take medications as defined in the Anti-coagulation Track section of this encounter.  Patient instructed to take today's dose.  Patient verbalized understanding of these instructions.       FOLLOW-UP Return in 2 weeks (on 04/04/2013) for Follow up INR at 1000h.  Hulen Luster, III Pharm.D., CACP

## 2013-03-21 NOTE — Patient Instructions (Signed)
Patient instructed to take medications as defined in the Anti-coagulation Track section of this encounter.  Patient instructed to take today's dose.  Patient verbalized understanding of these instructions.    

## 2013-03-22 NOTE — Progress Notes (Signed)
I agree with Dr. Saralyn Pilar assessment and plan as documented.  Will explore reason/indication for chronic anticoagulation and more clearly document it in the medical record.

## 2013-03-23 ENCOUNTER — Ambulatory Visit: Payer: Medicare Other | Admitting: Cardiothoracic Surgery

## 2013-03-23 ENCOUNTER — Encounter: Payer: Self-pay | Admitting: Infectious Disease

## 2013-03-25 ENCOUNTER — Encounter: Payer: Self-pay | Admitting: Internal Medicine

## 2013-03-30 ENCOUNTER — Other Ambulatory Visit: Payer: Self-pay | Admitting: Internal Medicine

## 2013-03-30 NOTE — Telephone Encounter (Signed)
Not on med list nor recent D/C summary.

## 2013-03-30 NOTE — Telephone Encounter (Signed)
I refilled the nexium. She is on Benazepril but I cannot find amlodipine nor Lotrel on med list or D/C summary. Can she tell you who Rx'd it?  Also, cannot find out who Rx'd Cymbalta. She is on Wellbutrin.

## 2013-03-31 NOTE — Telephone Encounter (Signed)
Not on med list nor last D/C summary

## 2013-03-31 NOTE — Telephone Encounter (Signed)
Cymbalta not on med list and not on most recent D/C med list

## 2013-04-04 ENCOUNTER — Encounter: Payer: Self-pay | Admitting: Internal Medicine

## 2013-04-04 ENCOUNTER — Other Ambulatory Visit: Payer: Self-pay | Admitting: *Deleted

## 2013-04-04 ENCOUNTER — Ambulatory Visit (INDEPENDENT_AMBULATORY_CARE_PROVIDER_SITE_OTHER): Payer: Medicare Other | Admitting: Pharmacist

## 2013-04-04 ENCOUNTER — Telehealth: Payer: Self-pay | Admitting: *Deleted

## 2013-04-04 DIAGNOSIS — Z7901 Long term (current) use of anticoagulants: Secondary | ICD-10-CM

## 2013-04-04 DIAGNOSIS — I82621 Acute embolism and thrombosis of deep veins of right upper extremity: Secondary | ICD-10-CM

## 2013-04-04 DIAGNOSIS — I82629 Acute embolism and thrombosis of deep veins of unspecified upper extremity: Secondary | ICD-10-CM

## 2013-04-04 LAB — POCT INR: INR: 2.4

## 2013-04-04 MED ORDER — PREGABALIN 200 MG PO CAPS: 200.0000 mg | ORAL_CAPSULE | Freq: Three times a day (TID) | ORAL | Status: DC

## 2013-04-04 MED ORDER — WARFARIN SODIUM 5 MG PO TABS: ORAL_TABLET | ORAL | Status: DC

## 2013-04-04 NOTE — Telephone Encounter (Signed)
Change med to 200 mg capsules, one pill TID #90. Needs called into pharmacy. Forwarded to refill nurse pool. Thanks

## 2013-04-04 NOTE — Telephone Encounter (Signed)
Rx called in 

## 2013-04-04 NOTE — Telephone Encounter (Signed)
Received faxed PA request from pt's pharmacy for her lyrica 100 mg  caps take 2 caps 3 times daily.  This medication is on the preferred list, but insurance will only pay for 3 caps a day.  If MD would like to continue the 100 mg caps, then a qty limit prior auth request will need to be submitted to insurance.  Spoke will [employer] at [phone] and the suggested MD change the dosing to 200 mg capsules 1 cap 3 times daily.  Will forward info to pcp for review.[name] Spittle Cassady4/14/20144:19 PM

## 2013-04-04 NOTE — Progress Notes (Signed)
Anti-Coagulation Progress Note  Jennifer [name] is a 52 y.o. female who is currently on an anti-coagulation regimen.    RECENT RESULTS: Recent results are below, the most recent result is correlated with a dose of 27.5 mg. per week: Lab Results  Component Value Date   INR 2.40 04/04/2013   INR 4.10 03/21/2013   INR 2.10 03/14/2013    ANTI-COAG DOSE: Anticoagulation Dose Instructions as of 04/04/2013     [name] Tue Wed Thu Fri Sat   New Dose 5 mg 2.5 mg 5 mg 2.5 mg 5 mg 2.5 mg 5 mg       ANTICOAG SUMMARY: Anticoagulation Episode Summary   Current INR goal 2.0-3.0  Next INR check 05/02/2013  INR from last check 2.40 (04/04/2013)  Weekly max dose   Target end date 06/14/2013  [facility] check [facility]  Preferred lab   Send INR reminders to    Indications  DVT of upper extremity (deep vein thrombosis) [453.82] Long term (current) use of anticoagulants [V58.61]        Comments         ANTICOAG TODAY: Anticoagulation Summary as of 04/04/2013   INR goal 2.0-3.0  Selected INR 2.40 (04/04/2013)  Next INR check 05/02/2013  Target end date 06/14/2013   Indications  DVT of upper extremity (deep vein thrombosis) [453.82] Long term (current) use of anticoagulants [V58.61]      Anticoagulation Episode Summary   [facility] check [facility]   Preferred lab    Send INR reminders to    Comments       PATIENT INSTRUCTIONS: Patient Instructions  Patient instructed to take medications as defined in the Anti-coagulation Track section of this encounter.  Patient instructed to take today's dose.  Patient verbalized understanding of these instructions.       FOLLOW-UP Return in 4 weeks (on 05/02/2013) for Follow up INR at 0930h.  Hulen Luster, III Pharm.D., CACP

## 2013-04-04 NOTE — Patient Instructions (Signed)
Patient instructed to take medications as defined in the Anti-coagulation Track section of this encounter.  Patient instructed to take today's dose.  Patient verbalized understanding of these instructions.    

## 2013-04-04 NOTE — Progress Notes (Signed)
Indication: Provoked upper extremity DVT.  Duration: 3 months.  INR at target.  Agree with Dr. Saralyn Pilar assessment and plan as documented.

## 2013-04-05 ENCOUNTER — Encounter: Payer: Self-pay | Admitting: Internal Medicine

## 2013-04-05 ENCOUNTER — Ambulatory Visit (INDEPENDENT_AMBULATORY_CARE_PROVIDER_SITE_OTHER): Payer: Medicare Other | Admitting: Internal Medicine

## 2013-04-05 VITALS — BP 132/82 | HR 64 | Temp 98.8°F | Ht 67.0 in | Wt 282.0 lb

## 2013-04-05 DIAGNOSIS — I330 Acute and subacute infective endocarditis: Secondary | ICD-10-CM

## 2013-04-05 DIAGNOSIS — M86679 Other chronic osteomyelitis, unspecified ankle and foot: Secondary | ICD-10-CM

## 2013-04-05 DIAGNOSIS — R269 Unspecified abnormalities of gait and mobility: Secondary | ICD-10-CM

## 2013-04-05 DIAGNOSIS — I82621 Acute embolism and thrombosis of deep veins of right upper extremity: Secondary | ICD-10-CM

## 2013-04-05 DIAGNOSIS — I10 Essential (primary) hypertension: Secondary | ICD-10-CM

## 2013-04-05 DIAGNOSIS — Z720 Tobacco use: Secondary | ICD-10-CM

## 2013-04-05 DIAGNOSIS — F329 Major depressive disorder, single episode, unspecified: Secondary | ICD-10-CM

## 2013-04-05 DIAGNOSIS — G894 Chronic pain syndrome: Secondary | ICD-10-CM

## 2013-04-05 DIAGNOSIS — F3289 Other specified depressive episodes: Secondary | ICD-10-CM

## 2013-04-05 DIAGNOSIS — F17200 Nicotine dependence, unspecified, uncomplicated: Secondary | ICD-10-CM

## 2013-04-05 DIAGNOSIS — I82629 Acute embolism and thrombosis of deep veins of unspecified upper extremity: Secondary | ICD-10-CM

## 2013-04-05 DIAGNOSIS — E1159 Type 2 diabetes mellitus with other circulatory complications: Secondary | ICD-10-CM

## 2013-04-05 LAB — GLUCOSE, CAPILLARY: Glucose-Capillary: 182 mg/dL — ABNORMAL HIGH (ref 70–99)

## 2013-04-05 MED ORDER — METFORMIN HCL 1000 MG PO TABS: 1000.0000 mg | ORAL_TABLET | Freq: Every day | ORAL | Status: DC

## 2013-04-05 NOTE — Assessment & Plan Note (Addendum)
  Assessment: Progress toward smoking cessation:  smoking the same amount Barriers to progress toward smoking cessation:  withdrawal symptoms;cost of medications;stress Comments:   Plan: Instruction/counseling given:  I reviewed strategies to maximize success. Educational resources provided:  [employer]) brochure Self management tools provided:  smoking cessation plan (STAR Quit Plan) Medications to assist with smoking cessation:  Bupropion (Zyban) Patient agreed to the following self-care plans for smoking cessation: call [facility] (1-800-QUIT-NOW)  Other plans: First cig of day is as soon as wakes up, while still in bed. I started wellbutrin last visit but pt states it is not helping. Her insurance will not cover the patches and 1800QUIT NOW will not provide to pts with insurance. Chantix is not a great choice now as pt in grief 2/2 boyfriend's death and depression that is only partially controlled. I stated that we will reconsider it in future. She gave me permission to talk to [facility] about smoking.

## 2013-04-05 NOTE — Assessment & Plan Note (Signed)
I reviewed the MRI's of the feet B from her last hospitalization and the rec's of Dr Algis Liming. No osteo. Cont to get wound care from [facility] three times a week with bandage changes. The pt still states that Dr Lajoyce Corners wants her non-weigh bearing so she is in bed / WC / chair most of the day.

## 2013-04-05 NOTE — Progress Notes (Signed)
  Subjective:    [name], female    DOB: [birth year], 52 y.o.   MRN: [id number]  HPI  Please see the A&P for the status of the pt's chronic medical problems.   Review of Systems  Constitutional: Positive for activity change, appetite change and unexpected weight change.  Gastrointestinal: Positive for diarrhea. Negative for constipation and blood in stool.  Musculoskeletal: Positive for myalgias, back pain, arthralgias and gait problem.  Skin: Positive for wound.  Psychiatric/Behavioral: Positive for sleep disturbance and decreased concentration.       Objective:   Physical Exam  Constitutional: She is oriented to person, place, and time. She appears well-developed and well-nourished. No distress.  HENT:  Head: Normocephalic and atraumatic.  Neurological: She is alert and oriented to person, place, and time.  Skin: Skin is warm and dry. She is not diaphoretic.  Feet bandaged  Psychiatric: She has a normal mood and affect. Her behavior is normal. Judgment and thought content normal.    I reviewed and updated the PMHx, soc hx, fam hx as indicated.      Assessment & Plan:

## 2013-04-05 NOTE — Assessment & Plan Note (Signed)
I reviewed her last TEE which was neg for vegetations and her blood cxs results. There was no repeat bacteremia during March admission. Now off IV ABX and on Augmentin. PICC line out.

## 2013-04-05 NOTE — Assessment & Plan Note (Signed)
Fall Screening 02/16/2013 03/01/2013 03/03/2013 03/09/2013 04/05/2013  Falls in the past year? Yes Yes No No Yes  Number of falls in past year 2 or more 2 or more - - 2 or more  Risk Factor Category  - - - - -      Assessment: Progress toward fall prevention goal:  unchanged Comments:  Plan: Fall prevention plans: Jennifer [name] provided her with the CDC handout. Educational resources provided: handout Self management tools provided: home fall prevention checklist

## 2013-04-05 NOTE — Patient Instructions (Addendum)
General Instructions: 1. Change metformin to only once a day at bedtime 2. Check your sugar before and after all three meals and at bedtime 3. [name] will call you Monday to get the sugar results 4. I will talk with Jodean Lima about a scale and smoking. 5. I gave you your script for percocet.   Treatment Goals:  Goals (1 Years of Data) as of 04/05/13         As of Today 03/17/13 03/10/13 03/10/13 03/10/13     Blood Pressure    . Blood Pressure < 140/90  163/74 180/83 159/91 147/63 141/67     Lifestyle    . Prevent Falls  Yes        . Quit smoking / using tobacco  No         Result Component    . HEMOGLOBIN A1C < 7.0          . LDL CALC < 100            Progress Toward Treatment Goals:  Treatment Goal 04/05/2013  Hemoglobin A1C unchanged  Blood pressure unchanged  Stop smoking smoking the same amount  Prevent falls unchanged    Self Care Goals & Plans:  Self Care Goal 04/05/2013  Manage my medications take my medicines as prescribed; bring my medications to every visit; refill my medications on time  Monitor my health keep track of my blood glucose; bring my glucose meter and log to each visit  Eat healthy foods eat more vegetables  Be physically active (No Data)  Stop smoking call [facility] (1-800-QUIT-NOW)    Home Blood Glucose Monitoring 04/05/2013  Check my blood sugar 3 times a day  When to check my blood sugar (No Data)   Check your sugar 7 times a day (before and after meals and at bedtime) for three days and let [name] know the levels.  Care Management & Community Referrals:  Referral 04/05/2013  Referrals made for care management support none needed  Referrals made to community resources -

## 2013-04-05 NOTE — Assessment & Plan Note (Signed)
Reviewed her weights. She is not doing hardly any weight bearing so exercise is out. Will ask [facility] if they can assist with scales.

## 2013-04-05 NOTE — Assessment & Plan Note (Signed)
Started Wellbutrin last visit. Boyfriend died. Some trouble sleeping but that is not new. Some tearfulness but overall doing OK.

## 2013-04-05 NOTE — Assessment & Plan Note (Signed)
Lab Results  Component Value Date   HGBA1C 9.5* 01/26/2013   HGBA1C 9.0 11/26/2012   HGBA1C 8.3 08/24/2012     Assessment: Diabetes control: poor control (HgbA1C >9%) Progress toward A1C goal:  unchanged Comments:   Plan: Medications:  continue current medications Home glucose monitoring: Frequency: 3 times a day Timing:  (haphazard testing) Instruction/counseling given: other instruction/counseling: see below Educational resources provided: handout Self management tools provided: home glucose logbook Other plans: She states that she checks her CBG 3-4 times a day but did not bring her meter. She was unable to give me her results but stated that fasting usually 300 - 400. I asked her to do intensive CBG testing for 3 days and then report levels to [name] so that we can adjust insulin. She understands the importance of good CBG control and the implication it has on wound healing.   Of note, she states the metformin is causing D. She didn't take it this AM so no issues today. When she takes it she has to stay home. Therefore, will decrease metformin to only QHS. The other possibility for her D cause is ABX assoc or C diff. However, she is clear that this only occurs when she takes the metformin and clears if she skips a dose.

## 2013-04-05 NOTE — Assessment & Plan Note (Signed)
She sees Dr Alexandria Lodge and the 12th May and requested her F/U with me on same date which I arranged.

## 2013-04-05 NOTE — Assessment & Plan Note (Signed)
BP Readings from Last 3 Encounters:  04/05/13 132/82  03/17/13 180/83  03/10/13 159/91    Lab Results  Component Value Date   NA 140 03/10/2013   K 3.8 03/10/2013   CREATININE 0.62 03/10/2013    Assessment: Blood pressure control: moderately elevated Progress toward BP goal:  unchanged Comments:   Plan: Medications:  continue current medications Educational resources provided: handout Self management tools provided: home blood pressure logbook Other plans: Her initial BP was elevated but repeat was OK. Cont meds as is.

## 2013-04-05 NOTE — Assessment & Plan Note (Addendum)
She requested to receive her script for her next Percocet refill which I personally handed to the pt prior to her leaving [facility]. It is pre-dated for the appropriate fill date.  She has L shoulder pain left over from her Rotator cuff surgery. PT helped. Will ask for home PT as pt essentially home bound 2/2 non weight bearing.

## 2013-04-06 ENCOUNTER — Other Ambulatory Visit: Payer: Self-pay

## 2013-04-06 ENCOUNTER — Other Ambulatory Visit: Payer: Self-pay | Admitting: Licensed Clinical Social Worker

## 2013-04-06 ENCOUNTER — Ambulatory Visit (HOSPITAL_COMMUNITY): Payer: Medicare Other | Attending: Cardiology

## 2013-04-06 ENCOUNTER — Ambulatory Visit (INDEPENDENT_AMBULATORY_CARE_PROVIDER_SITE_OTHER): Payer: Medicare Other | Admitting: Cardiothoracic Surgery

## 2013-04-06 ENCOUNTER — Encounter: Payer: Self-pay | Admitting: Cardiothoracic Surgery

## 2013-04-06 ENCOUNTER — Telehealth: Payer: Self-pay | Admitting: Licensed Clinical Social Worker

## 2013-04-06 VITALS — HR 70 | Resp 20 | Ht 67.0 in | Wt 282.0 lb

## 2013-04-06 DIAGNOSIS — I38 Endocarditis, valve unspecified: Secondary | ICD-10-CM

## 2013-04-06 DIAGNOSIS — I339 Acute and subacute endocarditis, unspecified: Secondary | ICD-10-CM

## 2013-04-06 DIAGNOSIS — G894 Chronic pain syndrome: Secondary | ICD-10-CM

## 2013-04-06 DIAGNOSIS — I330 Acute and subacute infective endocarditis: Secondary | ICD-10-CM

## 2013-04-06 NOTE — Telephone Encounter (Signed)
Ms. Karrer was referred to CSW for home health services.  CSW placed called to pt.  CSW left message requesting return call. CSW provided contact hours and phone number.  CSW in need of pt's preferred home health agency.

## 2013-04-06 NOTE — Progress Notes (Signed)
PCP is Blanch Media, MD Referring Provider is Daiva Eves, Lisette Grinder, MD  Chief Complaint  Patient presents with  . Endocarditis    1 month f/u to discuss 2-D Echo results from 03/10/13    HPI: Patient returns for followup for reason of possible mitral endocarditis associated with bacteremia from infected diabetic ulcers of her feet. She is been treated as an inpatient with IV antibiotics with transition to outpatient IV and now oral antibiotics followed by the ID clinic. While in the hospital there is potential vegetation on the posterior leaf of the mitral valve back in February. There is no significant MR.  She had a transesophageal echocardiogram performed 3 weeks ago in late March which showed no evidence of endocarditis no mitral regurgitation normal LV function.  Her diabetic ulcers are being followed by Dr. Lajoyce Corners and a responding to therapy with daily dressing changes. She is on oral Augmentin.  A transthoracic echocardiogram performed today is reviewed which shows no evidence of vegetation.  She is asymptomatic for shortness of breath or orthopnea or palpitation.  Past Medical History  Diagnosis Date  . Depression   . GERD (gastroesophageal reflux disease)   . Hyperlipidemia   . Hypertension   . Glaucoma   . PVD (peripheral vascular disease) with claudication     Stents to bilateral common iliac arteries (left 2005, right 2008), on chronic plavix  . Tinnitus   . Diabetic peripheral neuropathy   . Fibromyalgia   . Hyperplastic colon polyp 12/2010    Per colonoscopy (12/2010) - Dr. Arlyce Dice  . Diverticulosis   . Asthma   . COPD 01/08/2007    PFT's 05/2007 : FEV1/FVC 82, FEV1 64% pred, FEF 25-75% 40% predicted, 16% improvement in FEV1 with bronchodilators.     . Type II diabetes mellitus with peripheral circulatory disorders, uncontrolled (250.72) DX: 1993    Insulin dep. Poor control. Complicated by diabetic foot ulcer and diabetic eye disease.    . Chronic pain syndrome  12/03/2011    Likely secondary to depression, "fibromyalgia", neuropathy, and obesity.    . Tobacco abuse   . Chronic osteomyelitis of foot     chronic, right secondary to diabetic foot ulcers  . Polymicrobial bacterial infection 01/2013    GBS and S. aureus bacteremia // Source likely infected diabetic foot ulcer  . 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 Shortness of breath   . Chronic diastolic heart failure     grade 2 per 2D echocardiogram (01/2013)  . Ulcer of foot, chronic     Left. No OM per MRI (01/2013)    Past Surgical History  Procedure Laterality Date  . Abdominal hysterectomy  1997    secondary to uterine fibroids  . Tubal ligation    . Rotator cuff repair      bilateral  . Breast biopsy      multiple-benign per pt  . Wrist surgery      right  . Ganglion cyst excision      multiple  . Other surgical history      stents in lower ext  . Tee without cardioversion N/A 01/31/2013    Procedure: TRANSESOPHAGEAL ECHOCARDIOGRAM (TEE);  Surgeon: Pricilla Riffle, MD;  Location: [facility] ENDOSCOPY;  Service: Cardiovascular;  Laterality: N/A;  Rm [phone]  . Bladder surgery      bladder reconstruction surgery  . Tee without cardioversion  N/A 03/10/2013    Procedure: TRANSESOPHAGEAL ECHOCARDIOGRAM (TEE);  Surgeon: Laurey Morale, MD;  Location: [facility] ENDOSCOPY;  Service: Cardiovascular;  Laterality: N/A;  Rm. [phone]    Family History  Problem Relation Age of Onset  . Diverticulosis Mother   . Diabetes Mother   . Hypertension Mother   . Congestive Heart Failure Mother   . Asthma Father     Social History History  Substance Use Topics  . Smoking status: Current Every Day Smoker -- 1.50 packs/day for 40 years    Types: Cigarettes  . Smokeless tobacco: Current User    Types: Snuff     Comment: Half pack or less.  . Alcohol Use: No    Current Outpatient Prescriptions  Medication Sig  Dispense Refill  . albuterol (PROAIR HFA) 108 (90 BASE) MCG/ACT inhaler Inhale 2 puffs into the lungs every 6 (six) hours as needed for wheezing or shortness of breath.  8.5 each  5  . aspirin 81 MG tablet Take 81 mg by mouth daily.       Marland Kitchen atenolol (TENORMIN) 100 MG tablet Take 100 mg by mouth daily.      . benazepril (LOTENSIN) 40 MG tablet Take 40 mg by mouth daily.      Marland Kitchen buPROPion (WELLBUTRIN SR) 150 MG 12 hr tablet Take 150 mg by mouth 2 (two) times daily.      . collagenase (SANTYL) ointment Apply topically daily.  15 g  3  . esomeprazole (NEXIUM) 20 MG capsule Take 20 mg by mouth daily before breakfast.      . fluticasone (FLONASE) 50 MCG/ACT nasal spray Place 2 sprays into the nose 2 (two) times daily.      . Fluticasone-Salmeterol (ADVAIR DISKUS) 250-50 MCG/DOSE AEPB Inhale 1 puff into the lungs 2 (two) times daily.  60 each  5  . glucose blood test strip Check blood sugar up to 4 times a day before meals and bedtime Dx code- 250.72, insulin requiring  150 each  12  . HUMALOG MIX 75/25 KWIKPEN (75-25) 100 UNIT/ML SUSP INJECT SUBCUTANEOUSLY 120 UNITS IN THE MORNING AND 65 UNITS AT NIGHT  60 mL  5  . hydrochlorothiazide (HYDRODIURIL) 25 MG tablet Take 25 mg by mouth daily.      . metFORMIN (GLUCOPHAGE) 1000 MG tablet Take 1 tablet (1,000 mg total) by mouth at bedtime.  60 tablet  11  . oxyCODONE-acetaminophen (PERCOCET) 7.5-325 MG per tablet Take 1 tablet by mouth every 6 (six) hours as needed for pain.  120 tablet  0  . pregabalin (LYRICA) 200 MG capsule Take 1 capsule (200 mg total) by mouth 3 (three) times daily.  90 capsule  2  . silver sulfADIAZINE (SILVADENE) 1 % cream Apply 1 application topically daily. Apply to feet after soaking      . simvastatin (ZOCOR) 20 MG tablet Take 1 tablet (20 mg total) by mouth daily at 6 PM.  30 tablet  0  . warfarin (COUMADIN) 5 MG tablet Take 1 tablet on Sundays, Tuesdays, Thursdays & Saturdays; Take 1/2 tablet on Mondays, [facility] Fridays.  30  tablet  0  . amoxicillin-clavulanate (AUGMENTIN) 875-125 MG per tablet Take 1 tablet by mouth every 12 (twelve) hours.  60 tablet  0   No current facility-administered medications for this visit.    Allergies  Allergen Reactions  . Iohexol      Desc: IV CONTRAST CAUSE NEPHROPATHY IN 2007   . Morphine Sulfate Itching and Rash  Review of Systems no fevers  Pulse 70  Resp 20  Ht 5\' 7"  (1.702 m)  Wt 282 lb (127.914 kg)  BMI 44.16 kg/m2  SpO2 97% Physical Exam Alert and comfortable Breath sounds clear Heart rate regular No cardiac murmurs  Diagnostic Tests: Both TEE and transthoracic 2-D echocardiograms are reviewed showing no vegetation and full resolution of potential vegetation on the mitral valve. No valvular regurgitation  Impression: Cleared bacteremia, resolution of possible vegetation a mitral valve  Plan: No further cardiac surgical followup needed. Patient has no teeth-dental prophylaxis not relevant.

## 2013-04-07 NOTE — Telephone Encounter (Signed)
Pt has used [facility] in the past and was satisfied.  Referral to [facility] for [facility] PT completed.

## 2013-04-11 ENCOUNTER — Other Ambulatory Visit: Payer: Self-pay | Admitting: Internal Medicine

## 2013-04-11 ENCOUNTER — Other Ambulatory Visit: Payer: Self-pay | Admitting: *Deleted

## 2013-04-11 MED ORDER — SIMVASTATIN 20 MG PO TABS: 20.0000 mg | ORAL_TABLET | Freq: Every day | ORAL | Status: DC

## 2013-04-12 ENCOUNTER — Encounter: Payer: Self-pay | Admitting: Internal Medicine

## 2013-04-12 NOTE — Telephone Encounter (Signed)
Would you pls ask her about this med. Was removed 2/11 - but I cannot find the note that corresponds to the D/C. I have added other meds bc she never told me she was on this. Thanks

## 2013-04-13 ENCOUNTER — Telehealth: Payer: Self-pay | Admitting: Dietician

## 2013-04-13 NOTE — Telephone Encounter (Signed)
Called to follow up  On goals and blood sugars: she reports smoking down a little still working on quitting and she asked for call tomorrow to discuss blood sugars

## 2013-04-14 NOTE — Telephone Encounter (Signed)
Insulin doses: 120 units 75/25 Humalog Mix  qam  65 units before bedtime after evening meal (confirmed timing with pt)   Thurs   04/07/2013:     249/296 379/333 259/306 282 Friday 04/08/2013    129/172 HI/262  201/156 132 Sat 04/09/13    132/254 169/146 186/202 159  Gave patient [place] quitline number per her request and asked what she got out of increased self testing and she replied "it was too much" . She agreed to take her evening dose before her evening meal instead of bedtime starting today.

## 2013-04-14 NOTE — Telephone Encounter (Signed)
Received another refill request form [facility] -Talked to pt , stated she's no longer taking Cymbalta.

## 2013-04-14 NOTE — Telephone Encounter (Signed)
Thank you. I will deny med.

## 2013-04-21 ENCOUNTER — Ambulatory Visit: Payer: Medicare Other | Admitting: Infectious Disease

## 2013-04-22 ENCOUNTER — Encounter: Payer: Self-pay | Admitting: Internal Medicine

## 2013-04-22 ENCOUNTER — Telehealth: Payer: Self-pay | Admitting: Dietician

## 2013-04-26 ENCOUNTER — Other Ambulatory Visit: Payer: Self-pay | Admitting: Internal Medicine

## 2013-04-26 DIAGNOSIS — I10 Essential (primary) hypertension: Secondary | ICD-10-CM

## 2013-04-26 DIAGNOSIS — E1159 Type 2 diabetes mellitus with other circulatory complications: Secondary | ICD-10-CM

## 2013-04-26 MED ORDER — BENAZEPRIL HCL 40 MG PO TABS: 40.0000 mg | ORAL_TABLET | Freq: Every day | ORAL | Status: DC

## 2013-04-26 MED ORDER — BUPROPION HCL ER (SR) 150 MG PO TB12: 150.0000 mg | ORAL_TABLET | Freq: Two times a day (BID) | ORAL | Status: DC

## 2013-04-26 MED ORDER — HYDROCHLOROTHIAZIDE 25 MG PO TABS: 25.0000 mg | ORAL_TABLET | Freq: Every day | ORAL | Status: DC

## 2013-04-26 MED ORDER — ATENOLOL 100 MG PO TABS: 100.0000 mg | ORAL_TABLET | Freq: Every day | ORAL | Status: DC

## 2013-04-26 MED ORDER — ALBUTEROL SULFATE HFA 108 (90 BASE) MCG/ACT IN AERS: 2.0000 | INHALATION_SPRAY | Freq: Four times a day (QID) | RESPIRATORY_TRACT | Status: DC | PRN

## 2013-04-26 MED ORDER — PREGABALIN 200 MG PO CAPS: 200.0000 mg | ORAL_CAPSULE | Freq: Three times a day (TID) | ORAL | Status: DC

## 2013-04-26 MED ORDER — ESOMEPRAZOLE MAGNESIUM 20 MG PO CPDR: 20.0000 mg | DELAYED_RELEASE_CAPSULE | Freq: Every day | ORAL | Status: DC

## 2013-04-26 MED ORDER — FLUTICASONE-SALMETEROL 250-50 MCG/DOSE IN AEPB: 1.0000 | INHALATION_SPRAY | Freq: Two times a day (BID) | RESPIRATORY_TRACT | Status: DC

## 2013-04-26 MED ORDER — ASPIRIN 81 MG PO TABS: 81.0000 mg | ORAL_TABLET | Freq: Every day | ORAL | Status: DC

## 2013-04-26 MED ORDER — FLUTICASONE PROPIONATE 50 MCG/ACT NA SUSP: 2.0000 | Freq: Two times a day (BID) | NASAL | Status: DC

## 2013-04-26 MED ORDER — SIMVASTATIN 20 MG PO TABS: 20.0000 mg | ORAL_TABLET | Freq: Every day | ORAL | Status: DC

## 2013-04-26 MED ORDER — METFORMIN HCL 1000 MG PO TABS: 1000.0000 mg | ORAL_TABLET | Freq: Every day | ORAL | Status: DC

## 2013-04-26 MED ORDER — WARFARIN SODIUM 5 MG PO TABS: ORAL_TABLET | ORAL | Status: DC

## 2013-04-26 NOTE — Progress Notes (Signed)
Called to pharm, she does not use [facility], uses [facility]

## 2013-04-27 ENCOUNTER — Other Ambulatory Visit (HOSPITAL_COMMUNITY): Payer: Self-pay | Admitting: Orthopedic Surgery

## 2013-04-27 ENCOUNTER — Encounter (HOSPITAL_COMMUNITY): Payer: Self-pay | Admitting: Pharmacist

## 2013-04-27 NOTE — Telephone Encounter (Signed)
Left message for patient to increase morning insulin by 5 units and to check blood sugar 6 times a day for 3 days after making that changes. Requested return call to confirm she received the message and understood the new order for insulin and self monitoring of her blood sugar

## 2013-04-28 MED ORDER — DEXTROSE 5 % IV SOLN
3.0000 g | INTRAVENOUS | Status: AC
  Filled 2013-04-28: qty 3000

## 2013-04-28 MED ORDER — MUPIROCIN 2 % EX OINT: TOPICAL_OINTMENT | Freq: Once | CUTANEOUS | Status: DC

## 2013-04-30 ENCOUNTER — Inpatient Hospital Stay (HOSPITAL_COMMUNITY)
Admission: EM | Admit: 2013-04-30 | Discharge: 2013-05-03 | DRG: 190 | Disposition: A | Discharge status: Home / Self Care | Payer: Medicare Other | Attending: Internal Medicine | Admitting: Internal Medicine

## 2013-04-30 ENCOUNTER — Encounter (HOSPITAL_COMMUNITY): Payer: Self-pay

## 2013-04-30 ENCOUNTER — Other Ambulatory Visit: Payer: Self-pay

## 2013-04-30 ENCOUNTER — Emergency Department (HOSPITAL_COMMUNITY): Payer: Medicare Other

## 2013-04-30 DIAGNOSIS — F329 Major depressive disorder, single episode, unspecified: Secondary | ICD-10-CM | POA: Diagnosis present

## 2013-04-30 DIAGNOSIS — Z7901 Long term (current) use of anticoagulants: Secondary | ICD-10-CM

## 2013-04-30 DIAGNOSIS — J441 Chronic obstructive pulmonary disease with (acute) exacerbation: Principal | ICD-10-CM | POA: Diagnosis present

## 2013-04-30 DIAGNOSIS — E119 Type 2 diabetes mellitus without complications: Secondary | ICD-10-CM | POA: Diagnosis present

## 2013-04-30 DIAGNOSIS — Z79899 Other long term (current) drug therapy: Secondary | ICD-10-CM

## 2013-04-30 DIAGNOSIS — I5033 Acute on chronic diastolic (congestive) heart failure: Secondary | ICD-10-CM | POA: Diagnosis present

## 2013-04-30 DIAGNOSIS — E1165 Type 2 diabetes mellitus with hyperglycemia: Secondary | ICD-10-CM | POA: Diagnosis present

## 2013-04-30 DIAGNOSIS — I82629 Acute embolism and thrombosis of deep veins of unspecified upper extremity: Secondary | ICD-10-CM | POA: Diagnosis present

## 2013-04-30 DIAGNOSIS — E669 Obesity, unspecified: Secondary | ICD-10-CM | POA: Diagnosis present

## 2013-04-30 DIAGNOSIS — E1159 Type 2 diabetes mellitus with other circulatory complications: Secondary | ICD-10-CM | POA: Diagnosis present

## 2013-04-30 DIAGNOSIS — E1142 Type 2 diabetes mellitus with diabetic polyneuropathy: Secondary | ICD-10-CM | POA: Diagnosis present

## 2013-04-30 DIAGNOSIS — E1169 Type 2 diabetes mellitus with other specified complication: Secondary | ICD-10-CM | POA: Diagnosis present

## 2013-04-30 DIAGNOSIS — E118 Type 2 diabetes mellitus with unspecified complications: Secondary | ICD-10-CM | POA: Diagnosis present

## 2013-04-30 DIAGNOSIS — I798 Other disorders of arteries, arterioles and capillaries in diseases classified elsewhere: Secondary | ICD-10-CM | POA: Diagnosis present

## 2013-04-30 DIAGNOSIS — I509 Heart failure, unspecified: Secondary | ICD-10-CM | POA: Diagnosis present

## 2013-04-30 DIAGNOSIS — IMO0001 Reserved for inherently not codable concepts without codable children: Secondary | ICD-10-CM | POA: Diagnosis present

## 2013-04-30 DIAGNOSIS — I330 Acute and subacute infective endocarditis: Secondary | ICD-10-CM | POA: Diagnosis present

## 2013-04-30 DIAGNOSIS — E1151 Type 2 diabetes mellitus with diabetic peripheral angiopathy without gangrene: Secondary | ICD-10-CM | POA: Diagnosis present

## 2013-04-30 DIAGNOSIS — G4700 Insomnia, unspecified: Secondary | ICD-10-CM

## 2013-04-30 DIAGNOSIS — J019 Acute sinusitis, unspecified: Secondary | ICD-10-CM | POA: Diagnosis present

## 2013-04-30 DIAGNOSIS — F3289 Other specified depressive episodes: Secondary | ICD-10-CM | POA: Diagnosis present

## 2013-04-30 DIAGNOSIS — E11621 Type 2 diabetes mellitus with foot ulcer: Secondary | ICD-10-CM | POA: Diagnosis present

## 2013-04-30 DIAGNOSIS — R0982 Postnasal drip: Secondary | ICD-10-CM | POA: Diagnosis present

## 2013-04-30 DIAGNOSIS — I739 Peripheral vascular disease, unspecified: Secondary | ICD-10-CM | POA: Diagnosis present

## 2013-04-30 DIAGNOSIS — J45901 Unspecified asthma with (acute) exacerbation: Secondary | ICD-10-CM | POA: Diagnosis present

## 2013-04-30 DIAGNOSIS — G8929 Other chronic pain: Secondary | ICD-10-CM | POA: Diagnosis present

## 2013-04-30 DIAGNOSIS — K219 Gastro-esophageal reflux disease without esophagitis: Secondary | ICD-10-CM | POA: Diagnosis present

## 2013-04-30 DIAGNOSIS — F17200 Nicotine dependence, unspecified, uncomplicated: Secondary | ICD-10-CM | POA: Diagnosis present

## 2013-04-30 DIAGNOSIS — I10 Essential (primary) hypertension: Secondary | ICD-10-CM | POA: Diagnosis present

## 2013-04-30 DIAGNOSIS — E1149 Type 2 diabetes mellitus with other diabetic neurological complication: Secondary | ICD-10-CM | POA: Diagnosis present

## 2013-04-30 DIAGNOSIS — Z794 Long term (current) use of insulin: Secondary | ICD-10-CM

## 2013-04-30 DIAGNOSIS — E785 Hyperlipidemia, unspecified: Secondary | ICD-10-CM | POA: Diagnosis present

## 2013-04-30 DIAGNOSIS — J449 Chronic obstructive pulmonary disease, unspecified: Secondary | ICD-10-CM

## 2013-04-30 DIAGNOSIS — Z6841 Body Mass Index (BMI) 40.0 and over, adult: Secondary | ICD-10-CM

## 2013-04-30 HISTORY — DX: Acute embolism and thrombosis of deep veins of unspecified upper extremity: I82.629

## 2013-04-30 LAB — BASIC METABOLIC PANEL
BUN: 12 mg/dL (ref 6–23)
CO2: 31 mEq/L (ref 19–32)
Calcium: 8.9 mg/dL (ref 8.4–10.5)
Chloride: 103 mEq/L (ref 96–112)
Creatinine, Ser: 0.8 mg/dL (ref 0.50–1.10)
GFR calc Af Amer: >90 mL/min (ref >90)
GFR calc non Af Amer: 82 mL/min — ABNORMAL LOW (ref >90)
Glucose, Bld: 72 mg/dL (ref 70–99)
Potassium: 4 mEq/L (ref 3.5–5.1)
Sodium: 142 mEq/L (ref 135–145)

## 2013-04-30 LAB — PRO B NATRIURETIC PEPTIDE: Pro B Natriuretic peptide (BNP): 332.4 pg/mL — ABNORMAL HIGH (ref 0–125)

## 2013-04-30 LAB — CBC
HCT: 38.9 % (ref 36.0–46.0)
Hemoglobin: 12.4 g/dL (ref 12.0–15.0)
MCH: 30 pg (ref 26.0–34.0)
MCHC: 31.9 g/dL (ref 30.0–36.0)
MCV: 94 fL (ref 78.0–100.0)
Platelets: 195 10*3/uL (ref 150–400)
RBC: 4.14 MIL/uL (ref 3.87–5.11)
RDW: 14.9 % (ref 11.5–15.5)
WBC: 7.5 10*3/uL (ref 4.0–10.5)

## 2013-04-30 LAB — POCT I-STAT TROPONIN I: Troponin i, poc: 0 ng/mL (ref 0.00–0.08)

## 2013-04-30 LAB — GLUCOSE, CAPILLARY
Glucose-Capillary: 357 mg/dL — ABNORMAL HIGH (ref 70–99)
Glucose-Capillary: 253 mg/dL — ABNORMAL HIGH (ref 70–99)
Glucose-Capillary: 315 mg/dL — ABNORMAL HIGH (ref 70–99)

## 2013-04-30 LAB — PROTIME-INR
INR: 1.3 (ref 0.00–1.49)
Prothrombin Time: 15.9 seconds — ABNORMAL HIGH (ref 11.6–15.2)

## 2013-04-30 LAB — TROPONIN I: Troponin I: <0.3 ng/mL (ref <0.30)

## 2013-04-30 MED ORDER — AMLODIPINE BESYLATE 5 MG PO TABS
5.0000 mg | ORAL_TABLET | Freq: Every day | ORAL | Status: DC
  Administered 2013-04-30 – 2013-05-03 (×4): 5 mg via ORAL
  Filled 2013-04-30 (×4): qty 1

## 2013-04-30 MED ORDER — METHYLPREDNISOLONE SODIUM SUCC 125 MG IJ SOLR
125.0000 mg | Freq: Once | INTRAMUSCULAR | Status: AC
  Administered 2013-04-30: 125 mg via INTRAVENOUS
  Filled 2013-04-30: qty 2

## 2013-04-30 MED ORDER — PANTOPRAZOLE SODIUM 40 MG PO TBEC
40.0000 mg | DELAYED_RELEASE_TABLET | Freq: Every day | ORAL | Status: DC
  Administered 2013-04-30 – 2013-05-03 (×4): 40 mg via ORAL
  Filled 2013-04-30 (×4): qty 1

## 2013-04-30 MED ORDER — FUROSEMIDE 10 MG/ML IJ SOLN
60.0000 mg | Freq: Two times a day (BID) | INTRAMUSCULAR | Status: DC
  Administered 2013-04-30: 60 mg via INTRAVENOUS
  Filled 2013-04-30: qty 6

## 2013-04-30 MED ORDER — NICOTINE 21 MG/24HR TD PT24
21.0000 mg | MEDICATED_PATCH | Freq: Every day | TRANSDERMAL | Status: DC
  Administered 2013-04-30 – 2013-05-03 (×4): 21 mg via TRANSDERMAL
  Filled 2013-04-30 (×4): qty 1

## 2013-04-30 MED ORDER — IPRATROPIUM BROMIDE 0.02 % IN SOLN
0.5000 mg | Freq: Four times a day (QID) | RESPIRATORY_TRACT | Status: DC
  Administered 2013-04-30 – 2013-05-03 (×14): 0.5 mg via RESPIRATORY_TRACT
  Filled 2013-04-30 (×14): qty 2.5

## 2013-04-30 MED ORDER — IPRATROPIUM BROMIDE 0.02 % IN SOLN
0.5000 mg | Freq: Once | RESPIRATORY_TRACT | Status: AC
  Administered 2013-04-30: 0.5 mg via RESPIRATORY_TRACT
  Filled 2013-04-30: qty 2.5

## 2013-04-30 MED ORDER — IPRATROPIUM BROMIDE 0.02 % IN SOLN
0.5000 mg | RESPIRATORY_TRACT | Status: DC | PRN
  Filled 2013-04-30 (×2): qty 2.5

## 2013-04-30 MED ORDER — INSULIN ASPART 100 UNIT/ML ~~LOC~~ SOLN
0.0000 [IU] | Freq: Every day | SUBCUTANEOUS | Status: DC
  Administered 2013-04-30: 4 [IU] via SUBCUTANEOUS

## 2013-04-30 MED ORDER — OXYCODONE HCL 5 MG PO TABS
2.5000 mg | ORAL_TABLET | Freq: Four times a day (QID) | ORAL | Status: DC | PRN
  Administered 2013-05-01 – 2013-05-03 (×4): 2.5 mg via ORAL
  Filled 2013-04-30 (×5): qty 1

## 2013-04-30 MED ORDER — BUPROPION HCL ER (SR) 150 MG PO TB12
150.0000 mg | ORAL_TABLET | Freq: Two times a day (BID) | ORAL | Status: DC
  Administered 2013-04-30 – 2013-05-03 (×7): 150 mg via ORAL
  Filled 2013-04-30 (×8): qty 1

## 2013-04-30 MED ORDER — DEXTROSE 5 % IV SOLN
500.0000 mg | Freq: Once | INTRAVENOUS | Status: AC
  Administered 2013-04-30: 500 mg via INTRAVENOUS
  Filled 2013-04-30: qty 500

## 2013-04-30 MED ORDER — GUAIFENESIN ER 600 MG PO TB12
600.0000 mg | ORAL_TABLET | Freq: Two times a day (BID) | ORAL | Status: DC
  Administered 2013-04-30 – 2013-05-03 (×7): 600 mg via ORAL
  Filled 2013-04-30 (×8): qty 1

## 2013-04-30 MED ORDER — OXYCODONE-ACETAMINOPHEN 5-325 MG PO TABS
1.0000 | ORAL_TABLET | Freq: Four times a day (QID) | ORAL | Status: DC | PRN
  Administered 2013-04-30 – 2013-05-03 (×6): 1 via ORAL
  Filled 2013-04-30 (×6): qty 1

## 2013-04-30 MED ORDER — OXYCODONE-ACETAMINOPHEN 7.5-325 MG PO TABS: 1.0000 | ORAL_TABLET | Freq: Four times a day (QID) | ORAL | Status: DC | PRN

## 2013-04-30 MED ORDER — INSULIN ASPART PROT & ASPART (70-30 MIX) 100 UNIT/ML ~~LOC~~ SUSP
120.0000 [IU] | Freq: Every day | SUBCUTANEOUS | Status: DC
  Administered 2013-05-01 – 2013-05-03 (×3): 120 [IU] via SUBCUTANEOUS
  Filled 2013-04-30: qty 10

## 2013-04-30 MED ORDER — WARFARIN SODIUM 5 MG PO TABS
5.0000 mg | ORAL_TABLET | Freq: Once | ORAL | Status: AC
  Administered 2013-04-30: 5 mg via ORAL
  Filled 2013-04-30: qty 1

## 2013-04-30 MED ORDER — PREDNISONE 50 MG PO TABS
60.0000 mg | ORAL_TABLET | Freq: Every day | ORAL | Status: DC
  Administered 2013-04-30 – 2013-05-01 (×2): 60 mg via ORAL
  Filled 2013-04-30 (×3): qty 1

## 2013-04-30 MED ORDER — ENOXAPARIN SODIUM 150 MG/ML ~~LOC~~ SOLN
130.0000 mg | Freq: Two times a day (BID) | SUBCUTANEOUS | Status: DC
  Administered 2013-04-30: 130 mg via SUBCUTANEOUS
  Administered 2013-04-30: 10:00:00 via SUBCUTANEOUS
  Administered 2013-05-01 – 2013-05-03 (×5): 130 mg via SUBCUTANEOUS
  Filled 2013-04-30 (×10): qty 1

## 2013-04-30 MED ORDER — HEPARIN SODIUM (PORCINE) 5000 UNIT/ML IJ SOLN: 5000.0000 [IU] | Freq: Three times a day (TID) | INTRAMUSCULAR | Status: DC

## 2013-04-30 MED ORDER — ASPIRIN EC 81 MG PO TBEC
81.0000 mg | DELAYED_RELEASE_TABLET | Freq: Every day | ORAL | Status: DC
  Administered 2013-04-30 – 2013-05-03 (×4): 81 mg via ORAL
  Filled 2013-04-30 (×4): qty 1

## 2013-04-30 MED ORDER — LEVOFLOXACIN 750 MG PO TABS
750.0000 mg | ORAL_TABLET | Freq: Every day | ORAL | Status: DC
  Administered 2013-04-30 – 2013-05-03 (×4): 750 mg via ORAL
  Filled 2013-04-30 (×4): qty 1

## 2013-04-30 MED ORDER — SODIUM CHLORIDE 0.9 % IV SOLN: 250.0000 mL | INTRAVENOUS | Status: DC | PRN

## 2013-04-30 MED ORDER — INSULIN ASPART 100 UNIT/ML ~~LOC~~ SOLN
0.0000 [IU] | Freq: Three times a day (TID) | SUBCUTANEOUS | Status: DC
  Administered 2013-04-30: 15 [IU] via SUBCUTANEOUS
  Administered 2013-04-30: 5 [IU] via SUBCUTANEOUS
  Administered 2013-05-01: 2 [IU] via SUBCUTANEOUS
  Administered 2013-05-01: 3 [IU] via SUBCUTANEOUS

## 2013-04-30 MED ORDER — ATORVASTATIN CALCIUM 80 MG PO TABS
80.0000 mg | ORAL_TABLET | Freq: Every day | ORAL | Status: DC
Start: 2013-04-30 — End: 2013-05-03
  Administered 2013-04-30 – 2013-05-02 (×3): 80 mg via ORAL
  Filled 2013-04-30 (×4): qty 1

## 2013-04-30 MED ORDER — PREGABALIN 75 MG PO CAPS
200.0000 mg | ORAL_CAPSULE | Freq: Three times a day (TID) | ORAL | Status: DC
  Administered 2013-04-30 – 2013-05-03 (×10): 200 mg via ORAL
  Filled 2013-04-30: qty 8
  Filled 2013-04-30 (×2): qty 2
  Filled 2013-04-30: qty 8
  Filled 2013-04-30 (×7): qty 2

## 2013-04-30 MED ORDER — COLLAGENASE 250 UNIT/GM EX OINT
1.0000 "application " | TOPICAL_OINTMENT | Freq: Every day | CUTANEOUS | Status: DC
  Administered 2013-04-30 – 2013-05-03 (×4): 1 via TOPICAL
  Filled 2013-04-30: qty 30

## 2013-04-30 MED ORDER — BENAZEPRIL HCL 40 MG PO TABS
40.0000 mg | ORAL_TABLET | Freq: Every day | ORAL | Status: DC
  Administered 2013-04-30 – 2013-05-03 (×4): 40 mg via ORAL
  Filled 2013-04-30 (×4): qty 1

## 2013-04-30 MED ORDER — ALBUTEROL SULFATE (5 MG/ML) 0.5% IN NEBU
2.5000 mg | INHALATION_SOLUTION | Freq: Once | RESPIRATORY_TRACT | Status: AC
  Administered 2013-04-30: 2.5 mg via RESPIRATORY_TRACT
  Filled 2013-04-30: qty 0.5

## 2013-04-30 MED ORDER — SODIUM CHLORIDE 0.9 % IJ SOLN
3.0000 mL | Freq: Two times a day (BID) | INTRAMUSCULAR | Status: DC
  Administered 2013-04-30 – 2013-05-03 (×4): 3 mL via INTRAVENOUS

## 2013-04-30 MED ORDER — BACLOFEN 10 MG PO TABS
10.0000 mg | ORAL_TABLET | Freq: Three times a day (TID) | ORAL | Status: DC
  Administered 2013-04-30 – 2013-05-03 (×10): 10 mg via ORAL
  Filled 2013-04-30 (×12): qty 1

## 2013-04-30 MED ORDER — SILVER SULFADIAZINE 1 % EX CREA
1.0000 "application " | TOPICAL_CREAM | Freq: Every day | CUTANEOUS | Status: DC
  Administered 2013-04-30 – 2013-05-03 (×4): 1 via TOPICAL
  Filled 2013-04-30: qty 85

## 2013-04-30 MED ORDER — HYDRALAZINE HCL 20 MG/ML IJ SOLN
5.0000 mg | Freq: Once | INTRAMUSCULAR | Status: AC
  Administered 2013-04-30: 5 mg via INTRAVENOUS
  Filled 2013-04-30: qty 1

## 2013-04-30 MED ORDER — SODIUM CHLORIDE 0.9 % IJ SOLN: 3.0000 mL | INTRAMUSCULAR | Status: DC | PRN

## 2013-04-30 MED ORDER — ATENOLOL 100 MG PO TABS
100.0000 mg | ORAL_TABLET | Freq: Every day | ORAL | Status: DC
  Administered 2013-04-30 – 2013-05-03 (×4): 100 mg via ORAL
  Filled 2013-04-30 (×4): qty 1

## 2013-04-30 MED ORDER — ALBUTEROL SULFATE (5 MG/ML) 0.5% IN NEBU
2.5000 mg | INHALATION_SOLUTION | Freq: Four times a day (QID) | RESPIRATORY_TRACT | Status: DC
  Administered 2013-04-30 – 2013-05-03 (×14): 2.5 mg via RESPIRATORY_TRACT
  Filled 2013-04-30 (×13): qty 0.5

## 2013-04-30 MED ORDER — ZOLPIDEM TARTRATE 5 MG PO TABS
5.0000 mg | ORAL_TABLET | Freq: Every evening | ORAL | Status: DC | PRN
  Administered 2013-04-30: 5 mg via ORAL
  Filled 2013-04-30: qty 1

## 2013-04-30 MED ORDER — WARFARIN - PHARMACIST DOSING INPATIENT
Freq: Every day | Status: DC
  Administered 2013-04-30: 18:00:00

## 2013-04-30 MED ORDER — SODIUM CHLORIDE 0.9 % IJ SOLN
3.0000 mL | Freq: Two times a day (BID) | INTRAMUSCULAR | Status: DC
  Administered 2013-04-30 – 2013-05-03 (×7): 3 mL via INTRAVENOUS

## 2013-04-30 MED ORDER — INSULIN ASPART PROT & ASPART (70-30 MIX) 100 UNIT/ML ~~LOC~~ SUSP
65.0000 [IU] | Freq: Every day | SUBCUTANEOUS | Status: DC
  Administered 2013-04-30 – 2013-05-02 (×2): 65 [IU] via SUBCUTANEOUS
  Filled 2013-04-30: qty 10

## 2013-04-30 MED ORDER — DIPHENHYDRAMINE HCL 25 MG PO TABS
25.0000 mg | ORAL_TABLET | Freq: Three times a day (TID) | ORAL | Status: DC
  Administered 2013-04-30 – 2013-05-03 (×10): 25 mg via ORAL
  Filled 2013-04-30 (×12): qty 1

## 2013-04-30 MED ORDER — ALBUTEROL SULFATE (5 MG/ML) 0.5% IN NEBU
2.5000 mg | INHALATION_SOLUTION | RESPIRATORY_TRACT | Status: DC | PRN
  Administered 2013-05-01 – 2013-05-02 (×2): 2.5 mg via RESPIRATORY_TRACT
  Filled 2013-04-30 (×3): qty 0.5

## 2013-04-30 NOTE — Progress Notes (Addendum)
Reassessed on admission 5/10  BP elevated and MD has been paged.

## 2013-04-30 NOTE — ED Notes (Signed)
Report given to nurse.

## 2013-04-30 NOTE — ED Notes (Signed)
IV team here to access IV 

## 2013-04-30 NOTE — H&P (Signed)
Internal Medicine Attending Admission Note Date: 04/30/2013  Patient name: Jennifer [name] Medical record number: [id number] Date of birth: [birth year] Age: 52 y.o. Gender: female  I saw and evaluated the patient. I reviewed the resident's note and I agree with the resident's findings and plan as documented in the resident's note.  Chief Complaint(s): Shortness of breath X 5 days with associated sinus congestion, pressure, and post-nasal drip.  History - key components related to admission:  Jennifer [name] is a 52 year old woman with a history of diabetes, diastolic heart failure, endocarditis, hypertension, and probable asthma who presents with a five-day history of progressive dyspnea on exertion, cough productive of a thick sputum, and sinus congestion and pressure with postnasal drip. She notes anterior chest wall pain with coughing for the last 2 days and concomitant orthopnea and paroxysmal nocturnal dyspnea. She denies any fevers, shakes, chills, nausea, vomiting, or diarrhea. Other than some mild left upper quadrant abdominal pain she is without other complaints.  Physical Exam - key components related to admission:  Filed Vitals:   04/30/13 0822 04/30/13 0824 04/30/13 0856 04/30/13 1439  BP: 158/89 158/89    Pulse: 75 76    Temp: 98.3 F (36.8 C) 98.3 F (36.8 C)    TempSrc:  Oral    Resp:      Height:  5\' 7"  (1.702 m)    Weight:  285 lb 7 oz (129.474 kg)    SpO2: 96% 95% 98% 98%   General: Well-developed, well-nourished, woman sitting comfortably in bed receiving a nebulized breathing treatment in no acute distress. Lungs: By basilar inspiratory crackles with decent air movement and no appreciated wheezes during the treatment. Heart: Distant heart sounds but regular rate and rhythm without appreciated murmurs Abdomen: Soft, mild left upper quadrant tenderness without guarding or rebound. Active bowel sounds. Extremities: 1+ pitting edema to the midshin.  Lab results:  Basic  Metabolic Panel:  Recent Labs  40/98/11 0226  NA 142  K 4.0  CL 103  CO2 31  GLUCOSE 72  BUN 12  CREATININE 0.80  CALCIUM 8.9   CBC:  Recent Labs  04/30/13 0226  WBC 7.5  HGB 12.4  HCT 38.9  MCV 94.0  PLT 195   Cardiac Enzymes:  Recent Labs  04/30/13 0910  TROPONINI <0.30   CBG:  Recent Labs  04/30/13 1213  GLUCAP 357*   Coagulation:  Recent Labs  04/30/13 0304  INR 1.30   Misc. Labs:  Pro-BNP 332  Imaging results:  Dg Chest Port 1 View  04/30/2013  *RADIOLOGY REPORT*  Clinical Data: Shortness of breath.  PORTABLE CHEST - 1 VIEW  Comparison: Single view of the chest 01/25/2013 and PA and lateral chest 02/23/2013.  Findings: There is pulmonary vascular congestion without consolidative process, pneumothorax or effusion.  Heart size is upper normal.  No focal bony abnormality.  IMPRESSION: Pulmonary vascular congestion without acute abnormality.   Original Report Authenticated By: Holley Dexter, M.D.    Other results:  EKG: Normal sinus rhythm at 77 beats per minute, normal axis and intervals, no significant Q waves, no left ventricular hypertrophy by voltage, good R wave progression, no ST or T wave changes. Unchanged from the previous ECG on 03/10/2013.  Assessment & Plan by Problem:  Jennifer [name] is a 52 year old woman with a history of diastolic heart failure, hypertension, recent endocarditis, and probable asthma who presents with a 5-day history of progressive shortness of breath associated with sinus congestion, pressure and postnasal drip. She does have  orthopnea, paroxysmal nocturnal dyspnea, lower extremity edema, and a markedly increased afterload with a mildly elevated proBNP.  These point to a mild exacerbation of her chronic diastolic heart failure which may be contributing to her respiratory symptoms. That being said, I suspect she may have an asthma exacerbation related to her acute sinusitis and postnasal drip as the main driver of her  symptoms. Review of her previous spirometry in 2009 suggests possible asthma with a reversible ventilatory defect, as well as a restrictive process, although formal lung volumes were not measured. Her flow-volume loops were without significant scooping and her FEV1/FVC ratio was greater than 0.70. Thus, I doubt she has an underlying diagnosis of emphysema.  1) Acute asthma exacerbation secondary to acute sinusitis with postnasal drip: She is being treated with aggressive bronchodilators and steroids. Her acute sinusitis is also being treated with her chronic nasal steroids, Benadryl (in place of chlorpheniramine which is nonformulary), and levofloxacin. With aggressive treatment of her sinusitis I suspect her asthma will improve. She does require further clarification of her underlying lung disease with formal pulmonary function tests to also include lung volumes and diffusion. This should be done in approximately 4-6 weeks after she recovers from this asthma exacerbation. It should be kept in mind, if she is confirmed to have asthma on the pulmonary function tests with significant bronchodilator response, the atenolol may be hindering her pulmonary function, and if not necessary for cardiovascular disease, should be converted to another antihypertensive in the future.  2) Acute on chronic diastolic dysfunction: We will aggressively treat her elevated afterload in hopes of decreasing her acute diastolic dysfunction. This will be done with the addition of amlodipine to her hydrochlorothiazide and beta blocker. She will also receive IV Lasix twice a day for the next 24-48 hours as we monitor her weight.  3) Disposition: With aggressive therapy for her acute sinusitis and mildly exacerbated diastolic dysfunction I suspect or pulmonary symptoms will improve over the next 48-72 hours and she will be stable and ready for discharge with followup in the outpatient clinic. The importance of following through with the  pulmonary function tests to further categorize her underlying pulmonary disease will be stressed upon discharge.

## 2013-04-30 NOTE — H&P (Signed)
Hospital Admission Note Date: 04/30/2013  Patient name: Jennifer [name] Medical record number: [id number] Date of birth: [birth year] Age: 52 y.o. Gender: female PCP: Blanch Media, MD   Service:  Internal Medicine Teaching Service   Chief Complaint:  Trouble breathing    History of Present Illness:  This is a 52 year old woman with hypertension, diastolic heart failure, COPD, and a recent history of infective endocarditis who comes to the emergency department with 5 days of dyspnea. For 5 days now she has been dyspneic with exertion, now dyspneic at rest, and with significant orthopnea. She is unable to lie down at all. Over the same time, she has had a worsening cough that is producing yellow phlegm. She reports subjective fevers but objectively had a temperature measured less than 100F at home by her home health RN. She also reports chest pain. Onset 2 days ago. Provoked by coughing. Quality described as pressure. Location is mid chest without radiation. Severity is moderate. Timing is constant since onset. Aggravated by coughing. No alleviating factors. No nausea or diaphoresis.     Review of Systems:   Constitutional: Positive for chills. Negative for fever and diaphoresis.  Respiratory: Positive for cough, sputum production and shortness of breath.   Cardiovascular: Positive for chest pain, orthopnea and leg swelling.  Gastrointestinal: Negative for nausea, vomiting, abdominal pain, diarrhea, constipation and blood in stool.  Genitourinary: Negative for dysuria and hematuria.  Skin: Negative for rash.  Lymph: No lymphadenopathy.    Medical History: Past Medical History  Diagnosis Date  . Depression   . GERD (gastroesophageal reflux disease)   . Hyperlipidemia   . Hypertension   . Glaucoma   . PVD (peripheral vascular disease) with claudication     Stents to bilateral common iliac arteries (left 2005, right 2008), on chronic plavix  . Tinnitus   . Diabetic peripheral  neuropathy   . Fibromyalgia   . Hyperplastic colon polyp 12/2010    Per colonoscopy (12/2010) - Dr. Arlyce Dice  . Diverticulosis   . Asthma   . COPD 01/08/2007    PFT's 05/2007 : FEV1/FVC 82, FEV1 64% pred, FEF 25-75% 40% predicted, 16% improvement in FEV1 with bronchodilators.     . Type II diabetes mellitus with peripheral circulatory disorders, uncontrolled (250.72) DX: 1993    Insulin dep. Poor control. Complicated by diabetic foot ulcer and diabetic eye disease.    . Chronic pain syndrome 12/03/2011    Likely secondary to depression, "fibromyalgia", neuropathy, and obesity.    . Tobacco abuse   . Chronic osteomyelitis of foot     chronic, right secondary to diabetic foot ulcers  . Polymicrobial bacterial infection 01/2013    GBS and S. aureus bacteremia // Source likely infected diabetic foot ulcer  . 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 Shortness of breath   . Chronic diastolic heart failure     grade 2 per 2D echocardiogram (01/2013)  . Ulcer of foot, chronic     Left. No OM per MRI (01/2013)  . DVT of upper extremity (deep vein thrombosis) 03/11/2013    Secondary to PICC line. Right brachial vein, diagnosed on 03/10/2013 Coumadin for 3 months. End date 06/10/2013     Surgical History: Past Surgical History  Procedure Laterality Date  . Abdominal hysterectomy  1997    secondary to uterine fibroids  . Tubal ligation    .  Rotator cuff repair      bilateral  . Breast biopsy      multiple-benign per pt  . Wrist surgery      right  . Ganglion cyst excision      multiple  . Other surgical history      stents in lower ext  . Tee without cardioversion N/A 01/31/2013    Procedure: TRANSESOPHAGEAL ECHOCARDIOGRAM (TEE);  Surgeon: Pricilla Riffle, MD;  Location: [facility] ENDOSCOPY;  Service: Cardiovascular;  Laterality: N/A;  Rm [phone]  . Bladder surgery      bladder  reconstruction surgery  . Tee without cardioversion N/A 03/10/2013    Procedure: TRANSESOPHAGEAL ECHOCARDIOGRAM (TEE);  Surgeon: Laurey Morale, MD;  Location: [facility] ENDOSCOPY;  Service: Cardiovascular;  Laterality: N/A;  Rm. [id number]    Home Medications: 1. Albuterol MDI 2 puffs every 6 hours as needed for wheezing 2. Aspirin 81 mg every morning 3. Atenolol 100 mg every morning 4. Baclofen 10 mg 3 times a day 5. The mass or thrill 40 mg every morning 6. Bupropion 150 mg twice a day 7. Collagenase ointment applied topically daily 8. Esomeprazole 20 mg before breakfast 9. Fluticasone intranasal spray 2 sprays twice a day 10. Fluticasone-salmeterol 250-50 one puff twice a day 11. Hydrochlorothiazide 25 mg every morning 12. Insulin lispro protamine-insulin lispro 75-25 120 units in the morning and 65 units in the evening 13. Metformin 1000 mg twice a day 14. Oxycodone-acetaminophen 7.5-325 every 6 hours as needed for pain 15. Pravastatin 40 mg daily or simvastatin 20 mg daily 16. Pregabalin 200 mg 3 times a day 17. Warfarin 5 mg Tu/Th/Sa/Su 2.5 mg M/W/F   Allergies: Allergies as of 04/30/2013 - Review Complete 04/30/2013  Allergen Reaction Noted  . Iohexol  08/17/2007  . Morphine sulfate Itching and Rash     Family History: Family History  Problem Relation Age of Onset  . Diverticulosis Mother   . Diabetes Mother   . Hypertension Mother   . Congestive Heart Failure Mother   . Asthma Father     Social History: Social History  . Marital Status: Single    Number of Children: 2  . Years of Education: college   Occupational History  . Disability     previously worked as a Psychologist, counselling History Main Topics  . Smoking status: Current Every Day Smoker -- 1.50 packs/day for 40 years    Types: Cigarettes  . Smokeless tobacco: Current User    Types: Snuff     Comment: Half pack or less.  . Alcohol Use: No  . Drug Use: No  . Sexually Active: Not on file   Social History  Narrative   On disability. Lives with son in [place]. 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      Physical exam:  VITALS: BP 190/96, HR 81, RR 22, temp 99.36F, SpO2 95% on 2 L [place] GENERAL: obese, in respiratory distress HEAD: atraumatic, normocephalic EYES: pupils equal, round and reactive; sclera anicteric; normal conjunctiva EARS: canals patent and TMs normal bilaterally NOSE/THROAT: oropharynx clear, moist mucous membranes, pink gums NECK: supple, thyroid normal in size and without palpable nodules LYMPH: no cervical or supraclavicular lymphadenopathy LUNGS: expiratory wheezing heard throughout, no rales, increased work of breathing HEART: unable to auscultate heart due to tachypnea and breath sounds MSK: pain with palpation of the anterior chest wall PULSES: radial 2+ and symmetric ABDOMEN: soft, non-tender, normal bowel sounds, no masses or organomegaly SKIN: warm, dry, intact, normal turgor, changes consistent with  venous stasis on lower legs bilaterally EXTREMITIES: 1+, no clubbing or cyanosis PSYCH: patient is alert and oriented, mood and affect are normal and congruent      Lab results: Basic Metabolic Panel:  16/10/96 0226  NA 142  K 4.0  CL 103  CO2 31  GLUCOSE 72  BUN 12  CREATININE 0.80  CALCIUM 8.9     CBC:  04/30/13 0226  WBC 7.5  HGB 12.4  HCT 38.9  MCV 94.0  PLT 195    Cardiac:  04/30/13 0226  PROBNP 332.4*  POC TROP 0.00    Coagulation:  04/30/13 0304  LABPROT 15.9*  INR 1.30    Imaging results: Dg Chest Port 1 View 04/30/2013 FINDINGS: There is pulmonary vascular congestion without consolidative process, pneumothorax or effusion.  Heart size is upper normal.  No focal bony abnormality.  IMPRESSION: Pulmonary vascular congestion without acute abnormality.    Other results: EKG Results:  04/30/2013 Rate:  77 PR:  152 QRS:  66 QTc:  420 Axis:  Normal EKG: Normal sinus rhythm, nonspecific T wave changes, no evidence of acute  ischemia.   Assessment and Plan:  1.   Acute exacerbation of COPD:  Increased dyspnea, cough, and sputum production consistent with COPD exacerbation. We will treat with prednisone 60 mg and levofloxacin along with scheduled nebulized treatments. - Albuterol and ipratropium nebulized q6 scheduled and q2 PRN - Start oral levofloxacin 750 mg daily - Start oral prednisone 60 mg daily - Continue Mucinex 600 mg twice a day   2.   Acute exacerbation of chronic diastolic congestive heart failure:  Dyspnea and orthopnea for 5 days. Pulmonary congestion on chest radiograph. Mildly elevated proBNP at 332.4. She is not on furosemide at home. We will treat with 60 mg IV twice a day.  - IV furosemide 60 mg twice a day - Straight I&O - Daily weights  3.   Chest pain:  Likely musculoskeletal in nature arising from cough-induced injury. Reproducible on exam. Point-of-care troponins were negative. We will repeat a laparotomy troponin once. EKG is not concerning for acute ischemia.  - Troponin level  4.   History of infective endocarditis:  Blood cultures in February grew Staphylococcus aureus and Group B Streptococcus . 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 Repeat blood cultures on February 6, eighth, ninth, and in March were negative. Repeat TEE on March 20 was negative for vegetations. She is afebrile today with out leukocytosis. She completed treatment with Ancef.   5.   History of upper extremity DVT:  Thrombosis in the right brachial vein diagnosed 03/10/2013 secondary to PICC line. Started on warfarin with an expected length of treatment of 3 months. End date will be 06/10/2013.  - Warfarin per pharmacy dosing  - Bridging with enoxaparin per pharmacy  6.   Type II diabetes mellitus:  At home, she takes insulin lispro protamine-insulin lispro 75-25 120 units in the morning and 65 units in the evening. Her last hemoglobin A1c was 9.5 in February. A microalbumin to creatinine ratio  was normal in July of last year. We are, we will have her on insulin aspart protamine-insulin aspart 70-30 at her home dose. - Insulin aspart protamine-insulin aspart 70-30 120 units with breakfast and 65 units with dinner - Moderate scale correctional insulin aspart  7.   Hypertension: Uncontrolled. On atenolol 100 mg daily and benazepril 40 mg daily. We expect some response to diuresis. - Continue atenolol 100 mg daily - Continue benazepril 40 mg  daily  8.   Hyperlipidemia: Last lipid panel was July 2013: cholesterol 189, triglycerides 247, HDL 31, LDL 109. Ten-year CVD risk is greater than 65%. Unsure if she is on pravastatin 40 or simvastatin 20 once a day. - Atorvastatin 80 mg daily while hospitalized, consider discharging her on this medicine - Continue aspirin 81 mg daily  9.   Depression and chronic pain: At home she takes bupropion 150 mg twice a day and pregabalin 200 mg 3 times a day. Continue home regimen - Continue bupropion 150 mg twice a day - Continue pregabalin 200 mg 3 times a day  10. Prophylaxis: Therapeutic warfarin and enoxaparin   11. Dispostion:  [facility] patient.  Needs follow up with [place]. Expected length of stay greater than 2 nights.    Signed by:  Dorthula Rue. Earlene Plater, MD PGY-I, Internal Medicine  04/30/2013, 6:50 AM

## 2013-04-30 NOTE — ED Notes (Signed)
Called report , at 74 nurse unable to take report.

## 2013-04-30 NOTE — Progress Notes (Signed)
ANTICOAGULATION CONSULT NOTE - Initial Consult  Pharmacy Consult for coumadin/lovenox Indication: Recent hx of DVT  Allergies  Allergen Reactions  . Iohexol      Desc: IV CONTRAST CAUSE NEPHROPATHY IN 2007   . Morphine Sulfate Itching and Rash   Weight = 128 kg   Vital Signs: Temp: 99.4 F (37.4 C) (05/10 0204) Temp src: Oral (05/10 0204) BP: 190/96 mmHg (05/10 0204) Pulse Rate: 81 (05/10 0204)  Labs:  Recent Labs  04/30/13 0226 04/30/13 0304  HGB 12.4  --   HCT 38.9  --   PLT 195  --   LABPROT  --  15.9*  INR  --  1.30  CREATININE 0.80  --     The CrCl is unknown because both a height and weight (above a minimum accepted value) are required for this calculation.   Medical History: Past Medical History  Diagnosis Date  . Depression   . GERD (gastroesophageal reflux disease)   . Hyperlipidemia   . Hypertension   . Glaucoma   . PVD (peripheral vascular disease) with claudication     Stents to bilateral common iliac arteries (left 2005, right 2008), on chronic plavix  . Tinnitus   . Diabetic peripheral neuropathy   . Fibromyalgia   . Hyperplastic colon polyp 12/2010    Per colonoscopy (12/2010) - Dr. Arlyce Dice  . Diverticulosis   . Asthma   . COPD 01/08/2007    PFT's 05/2007 : FEV1/FVC 82, FEV1 64% pred, FEF 25-75% 40% predicted, 16% improvement in FEV1 with bronchodilators.     . Type II diabetes mellitus with peripheral circulatory disorders, uncontrolled (250.72) DX: 1993    Insulin dep. Poor control. Complicated by diabetic foot ulcer and diabetic eye disease.    . Chronic pain syndrome 12/03/2011    Likely secondary to depression, "fibromyalgia", neuropathy, and obesity.    . Tobacco abuse   . Chronic osteomyelitis of foot     chronic, right secondary to diabetic foot ulcers  . Polymicrobial bacterial infection 01/2013    GBS and S. aureus bacteremia // Source likely infected diabetic foot ulcer  . 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 Shortness of breath   . Chronic diastolic heart failure     grade 2 per 2D echocardiogram (01/2013)  . Ulcer of foot, chronic     Left. No OM per MRI (01/2013)  . DVT of upper extremity (deep vein thrombosis) 03/11/2013    Secondary to PICC line. Right brachial vein, diagnosed on 03/10/2013 Coumadin for 3 months. End date 06/10/2013     Assessment: 31 YOF presented with 5 days of dyspnea. Patient also has recent hx of rght brachial vein thrombosis (diagnosed 03/10/2013) d/u PICC line. She was started on coumadin and expected to treat for 3 months (end date 06/10/2013), INR subtherapeutic = 1.3. Pharmacy is consulted to manage lovenox bridge to coumadin. Scr 0.8, est. crcl ~80-90 ml/min  PTA dose: 2.5 mg on MWF, and 5mg  on all other days.  Goal of Therapy:  INR 2-3 Monitor platelets by anticoagulation protocol: Yes   Plan:  - Lovenox 130 mg SQ Q 12hrs - Coumadin 5mg  po x 1 - f/u daily INR  Bayard Hugger, PharmD, BCPS  Clinical Pharmacist  Pager: [phone]   04/30/2013,6:35 AM

## 2013-04-30 NOTE — ED Provider Notes (Signed)
History     CSN: [id number]  Arrival date & time 04/30/13  0157   First MD Initiated Contact with Patient 04/30/13 [phone]      Chief Complaint  Patient presents with  . Shortness of Breath  . Nasal Congestion    (Consider location/radiation/quality/duration/timing/severity/associated sxs/prior treatment) Patient is a 52 y.o. female presenting with shortness of breath. The history is provided by the patient.  Shortness of Breath Severity:  Moderate Onset quality:  Gradual Duration:  2 days Timing:  Constant Progression:  Worsening Chronicity:  Recurrent Relieved by:  Nothing Worsened by:  Exertion Ineffective treatments:  Inhaler Associated symptoms: fever   Associated symptoms: no chest pain and no vomiting     Past Medical History  Diagnosis Date  . Depression   . GERD (gastroesophageal reflux disease)   . Hyperlipidemia   . Hypertension   . Glaucoma   . PVD (peripheral vascular disease) with claudication     Stents to bilateral common iliac arteries (left 2005, right 2008), on chronic plavix  . Tinnitus   . Diabetic peripheral neuropathy   . Fibromyalgia   . Hyperplastic colon polyp 12/2010    Per colonoscopy (12/2010) - Dr. Arlyce Dice  . Diverticulosis   . Asthma   . COPD 01/08/2007    PFT's 05/2007 : FEV1/FVC 82, FEV1 64% pred, FEF 25-75% 40% predicted, 16% improvement in FEV1 with bronchodilators.     . Type II diabetes mellitus with peripheral circulatory disorders, uncontrolled (250.72) DX: 1993    Insulin dep. Poor control. Complicated by diabetic foot ulcer and diabetic eye disease.    . Chronic pain syndrome 12/03/2011    Likely secondary to depression, "fibromyalgia", neuropathy, and obesity.    . Tobacco abuse   . Chronic osteomyelitis of foot     chronic, right secondary to diabetic foot ulcers  . Polymicrobial bacterial infection 01/2013    GBS and S. aureus bacteremia // Source likely infected diabetic foot ulcer  . 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 Shortness of breath   . Chronic diastolic heart failure     grade 2 per 2D echocardiogram (01/2013)  . Ulcer of foot, chronic     Left. No OM per MRI (01/2013)    Past Surgical History  Procedure Laterality Date  . Abdominal hysterectomy  1997    secondary to uterine fibroids  . Tubal ligation    . Rotator cuff repair      bilateral  . Breast biopsy      multiple-benign per pt  . Wrist surgery      right  . Ganglion cyst excision      multiple  . Other surgical history      stents in lower ext  . Tee without cardioversion N/A 01/31/2013    Procedure: TRANSESOPHAGEAL ECHOCARDIOGRAM (TEE);  Surgeon: Pricilla Riffle, MD;  Location: [facility] ENDOSCOPY;  Service: Cardiovascular;  Laterality: N/A;  Rm [phone]  . Bladder surgery      bladder reconstruction surgery  . Tee without cardioversion N/A 03/10/2013    Procedure: TRANSESOPHAGEAL ECHOCARDIOGRAM (TEE);  Surgeon: Laurey Morale, MD;  Location: [facility] ENDOSCOPY;  Service: Cardiovascular;  Laterality: N/A;  Rm. [id number]    Family History  Problem Relation Age of Onset  . Diverticulosis Mother   . Diabetes Mother   . Hypertension Mother   . Congestive Heart Failure Mother   .  Asthma Father     History  Substance Use Topics  . Smoking status: Current Every Day Smoker -- 1.50 packs/day for 40 years    Types: Cigarettes  . Smokeless tobacco: Current User    Types: Snuff     Comment: Half pack or less.  . Alcohol Use: No    OB History   Grav Para Term Preterm Abortions TAB SAB Ect Mult Living                  Review of Systems  Constitutional: Positive for fever and chills.  HENT: Negative for congestion and rhinorrhea.   Respiratory: Positive for shortness of breath.   Cardiovascular: Positive for leg swelling. Negative for chest pain and palpitations.  Gastrointestinal: Negative for nausea and vomiting.  Skin: Positive for  wound.  Neurological: Positive for weakness. Negative for dizziness.  All other systems reviewed and are negative.    Allergies  Iohexol and Morphine sulfate  Home Medications   Current Outpatient Rx  Name  Route  Sig  Dispense  Refill  . albuterol (PROAIR HFA) 108 (90 BASE) MCG/ACT inhaler   Inhalation   Inhale 2 puffs into the lungs every 6 (six) hours as needed for wheezing.   8.5 each   5   . aspirin EC 81 MG tablet   Oral   Take 81 mg by mouth every morning.         Marland Kitchen atenolol (TENORMIN) 100 MG tablet   Oral   Take 100 mg by mouth every morning.         . baclofen (LIORESAL) 10 MG tablet   Oral   Take 10 mg by mouth 3 (three) times daily.         . benazepril (LOTENSIN) 40 MG tablet   Oral   Take 40 mg by mouth every morning.         Marland Kitchen buPROPion (WELLBUTRIN SR) 150 MG 12 hr tablet   Oral   Take 1 tablet (150 mg total) by mouth 2 (two) times daily.   60 tablet   5   . collagenase (SANTYL) ointment   Topical   Apply 1 application topically daily.         Marland Kitchen esomeprazole (NEXIUM) 20 MG capsule   Oral   Take 1 capsule (20 mg total) by mouth daily before breakfast.   30 capsule   5   . fluticasone (FLONASE) 50 MCG/ACT nasal spray   Nasal   Place 2 sprays into the nose 2 (two) times daily as needed for allergies.         . Fluticasone-Salmeterol (ADVAIR DISKUS) 250-50 MCG/DOSE AEPB   Inhalation   Inhale 1 puff into the lungs 2 (two) times daily.   60 each   5   . hydrochlorothiazide (HYDRODIURIL) 25 MG tablet   Oral   Take 25 mg by mouth every morning.         . Insulin Lispro Prot & Lispro (HUMALOG MIX 75/25 KWIKPEN) (75-25) 100 UNIT/ML SUPN   Subcutaneous   Inject 65-120 Units into the skin 2 (two) times daily. 120 units in the morning and 65 units in the evening         . metFORMIN (GLUCOPHAGE) 1000 MG tablet   Oral   Take 1,000 mg by mouth 2 (two) times daily.         . mometasone-formoterol (DULERA) 100-5 MCG/ACT AERO    Inhalation   Inhale 2 puffs into the lungs  2 (two) times daily.         Marland Kitchen oxyCODONE-acetaminophen (PERCOCET) 7.5-325 MG per tablet   Oral   Take 1 tablet by mouth every 6 (six) hours as needed for pain.   120 tablet   0     Script 3 of 3. Please fill 60 days after the print ...   . pravastatin (PRAVACHOL) 40 MG tablet   Oral   Take 40 mg by mouth every morning.         . pregabalin (LYRICA) 200 MG capsule   Oral   Take 1 capsule (200 mg total) by mouth 3 (three) times daily.   90 capsule   3   . silver sulfADIAZINE (SILVADENE) 1 % cream   Topical   Apply 1 application topically daily. Apply to feet after soaking         . simvastatin (ZOCOR) 20 MG tablet   Oral   Take 1 tablet (20 mg total) by mouth daily at 6 PM.   30 tablet   5   . warfarin (COUMADIN) 5 MG tablet   Oral   Take 2.5-5 mg by mouth daily. Take 1 tablet on Sundays, Tuesdays, Thursdays & Saturdays; Take 1/2 tablet on Mondays, Wednesdays, Fridays         . glucose blood test strip      Check blood sugar up to 4 times a day before meals and bedtime Dx code- 250.72, insulin requiring   150 each   12     BP 190/96  Pulse 81  Temp(Src) 99.4 F (37.4 C) (Oral)  Resp 22  SpO2 95%  Physical Exam  Vitals reviewed. Constitutional: She is oriented to person, place, and time. She appears well-developed and well-nourished.  obese  HENT:  Head: Normocephalic and atraumatic.  Eyes: Pupils are equal, round, and reactive to light.  Neck: Normal range of motion.  Cardiovascular: Normal rate and regular rhythm.   Pulmonary/Chest: Effort normal. No respiratory distress. She has no wheezes. She has rales. She exhibits no tenderness.  Abdominal: Soft.  Musculoskeletal: Normal range of motion. She exhibits edema and tenderness.  Chronic status ulcers of L lower leg, + edema R lower leg   Neurological: She is alert and oriented to person, place, and time.  Skin: Skin is warm and dry. No rash noted. There  is pallor.    ED Course  Procedures (including critical care time)  Labs Reviewed  BASIC METABOLIC PANEL - Abnormal; Notable for the following:    GFR calc non Af Amer 82 (*)    All other components within normal limits  PRO B NATRIURETIC PEPTIDE - Abnormal; Notable for the following:    Pro B Natriuretic peptide (BNP) 332.4 (*)    All other components within normal limits  PROTIME-INR - Abnormal; Notable for the following:    Prothrombin Time 15.9 (*)    All other components within normal limits  CBC  POCT I-STAT TROPONIN I   Dg Chest Port 1 View  04/30/2013  *RADIOLOGY REPORT*  Clinical Data: Shortness of breath.  PORTABLE CHEST - 1 VIEW  Comparison: Single view of the chest 01/25/2013 and PA and lateral chest 02/23/2013.  Findings: There is pulmonary vascular congestion without consolidative process, pneumothorax or effusion.  Heart size is upper normal.  No focal bony abnormality.  IMPRESSION: Pulmonary vascular congestion without acute abnormality.   Original Report Authenticated By: Holley Dexter, M.D.      1. COPD (chronic obstructive pulmonary disease) with  chronic bronchitis       MDM   COPD execration with episodes of desaturation with exertion         Arman Filter, NP 04/30/13 9562  Arman Filter, NP 04/30/13 0522

## 2013-04-30 NOTE — ED Notes (Signed)
Unable to Obtain an IV access, called IV team.

## 2013-04-30 NOTE — ED Notes (Addendum)
Pt c/o chest and nasal congestion, productive cough with yellow colored sputum, fever, chills, and SOB starting Tuesday. Audible wheezing noted in triage. Pt denies chest pain or N/V

## 2013-04-30 NOTE — Progress Notes (Signed)
Dressings to both of feet changed per order.  Peri wound is brownish with dry skin.  Granulation noted in the middle of both ulcers on the bottom of feet, edges appear rolled.  Drainage of yellowish green with mild odor.  Client unable to feel discomfort in feet per the client.  Toenails are thick and discolored.  Both feet are swollen.

## 2013-05-01 DIAGNOSIS — G4700 Insomnia, unspecified: Secondary | ICD-10-CM

## 2013-05-01 LAB — BASIC METABOLIC PANEL
BUN: 22 mg/dL (ref 6–23)
CO2: 32 mEq/L (ref 19–32)
Calcium: 9.7 mg/dL (ref 8.4–10.5)
Chloride: 98 mEq/L (ref 96–112)
Creatinine, Ser: 0.9 mg/dL (ref 0.50–1.10)
GFR calc Af Amer: 83 mL/min — ABNORMAL LOW (ref >90)
GFR calc non Af Amer: 71 mL/min — ABNORMAL LOW (ref >90)
Glucose, Bld: 238 mg/dL — ABNORMAL HIGH (ref 70–99)
Potassium: 4.3 mEq/L (ref 3.5–5.1)
Sodium: 138 mEq/L (ref 135–145)

## 2013-05-01 LAB — GLUCOSE, CAPILLARY
Glucose-Capillary: 150 mg/dL — ABNORMAL HIGH (ref 70–99)
Glucose-Capillary: 339 mg/dL — ABNORMAL HIGH (ref 70–99)
Glucose-Capillary: 252 mg/dL — ABNORMAL HIGH (ref 70–99)

## 2013-05-01 LAB — PROTIME-INR
INR: 1.29 (ref 0.00–1.49)
Prothrombin Time: 15.8 seconds — ABNORMAL HIGH (ref 11.6–15.2)

## 2013-05-01 MED ORDER — PREDNISONE 20 MG PO TABS
40.0000 mg | ORAL_TABLET | Freq: Every day | ORAL | Status: DC
  Administered 2013-05-02 – 2013-05-03 (×2): 40 mg via ORAL
  Filled 2013-05-01 (×3): qty 2

## 2013-05-01 MED ORDER — INSULIN ASPART 100 UNIT/ML ~~LOC~~ SOLN
0.0000 [IU] | Freq: Three times a day (TID) | SUBCUTANEOUS | Status: DC
  Administered 2013-05-01: 11 [IU] via SUBCUTANEOUS
  Administered 2013-05-02: 7 [IU] via SUBCUTANEOUS
  Administered 2013-05-02: 11 [IU] via SUBCUTANEOUS
  Administered 2013-05-02 – 2013-05-03 (×2): 3 [IU] via SUBCUTANEOUS
  Administered 2013-05-03: 15 [IU] via SUBCUTANEOUS

## 2013-05-01 MED ORDER — HYDROCOD POLST-CHLORPHEN POLST 10-8 MG/5ML PO LQCR
5.0000 mL | Freq: Two times a day (BID) | ORAL | Status: DC | PRN
  Administered 2013-05-01 – 2013-05-03 (×3): 5 mL via ORAL
  Filled 2013-05-01 (×3): qty 5

## 2013-05-01 MED ORDER — TRAZODONE HCL 50 MG PO TABS
50.0000 mg | ORAL_TABLET | Freq: Every evening | ORAL | Status: DC | PRN
  Administered 2013-05-01 – 2013-05-02 (×2): 50 mg via ORAL
  Filled 2013-05-01 (×2): qty 1

## 2013-05-01 MED ORDER — HYDROCHLOROTHIAZIDE 25 MG PO TABS
25.0000 mg | ORAL_TABLET | Freq: Every day | ORAL | Status: DC
  Administered 2013-05-01 – 2013-05-03 (×3): 25 mg via ORAL
  Filled 2013-05-01 (×3): qty 1

## 2013-05-01 MED ORDER — INSULIN ASPART 100 UNIT/ML ~~LOC~~ SOLN: 0.0000 [IU] | SUBCUTANEOUS | Status: DC

## 2013-05-01 MED ORDER — WARFARIN SODIUM 7.5 MG PO TABS
7.5000 mg | ORAL_TABLET | Freq: Once | ORAL | Status: AC
  Administered 2013-05-01: 7.5 mg via ORAL
  Filled 2013-05-01: qty 1

## 2013-05-01 MED ORDER — FUROSEMIDE 10 MG/ML IJ SOLN
60.0000 mg | Freq: Once | INTRAMUSCULAR | Status: AC
  Administered 2013-05-01: 60 mg via INTRAVENOUS
  Filled 2013-05-01: qty 6

## 2013-05-01 NOTE — Progress Notes (Signed)
Internal Medicine Attending  Date: 05/01/2013  Patient name: Jennifer [name] Medical record number: [id number] Date of birth: 10-May-1961 Age: 52 y.o. Gender: female  I saw and evaluated the patient. I reviewed the resident's note by Dr. Sherrine Maples and I agree with the resident's findings and plans as documented in her progress note.  She continues to have some sinus congestion and significant dyspnea on exertion.  We are continuing aggressive therapy for her sinus disease and concomitant asthma exacerbation.  She will need formal PFTs 4-6 weeks after discharge to better characterize her pulmonary disease.

## 2013-05-01 NOTE — Progress Notes (Signed)
Subjective: Pt states that she is not feeling well today. She c/o of a severe productive cough, but is unable to cough anything up. She states that she was unable to sleep last night despite taking Ambien and has not slept since Friday night.  Objective: Vital signs in last 24 hours: Filed Vitals:   04/30/13 2044 05/01/13 0203 05/01/13 0518 05/01/13 1025  BP: 157/75  157/81 145/83  Pulse: 81  76   Temp: 98 F (36.7 C)  97.4 F (36.3 C)   TempSrc: Oral  Oral   Resp: 22  22   Height:      Weight:   280 lb 6.8 oz (127.2 kg)   SpO2: 96% 95% 97%    Weight change:   Intake/Output Summary (Last 24 hours) at 05/01/13 1152 Last data filed at 04/30/13 2300  Gross per 24 hour  Intake    300 ml  Output   2350 ml  Net  -2050 ml   Vitals reviewed. General: Sitting on the side of the bed, NAD HEENT: PERRL, EOMI, no scleral icterus Cardiac: RRR, no rubs, murmurs or gallops Pulm: Diffusely course breath sounds, with end-expiratory wheezing, productive cough Abd: Obese, soft, nontender, non-distended Ext: Venous stasis changes to skin with woody appearance, non-pitting edema of BLE, non-tender. Neuro: Alert and oriented X3, nonfocal  Lab Results: Basic Metabolic Panel:  Recent Labs Lab 04/30/13 0226 05/01/13 0450  NA 142 138  K 4.0 4.3  CL 103 98  CO2 31 32  GLUCOSE 72 238*  BUN 12 22  CREATININE 0.80 0.90  CALCIUM 8.9 9.7   CBC:  Recent Labs Lab 04/30/13 0226  WBC 7.5  HGB 12.4  HCT 38.9  MCV 94.0  PLT 195   Cardiac Enzymes:  Recent Labs Lab 04/30/13 0910  TROPONINI <0.30   BNP:  Recent Labs Lab 04/30/13 0226  PROBNP 332.4*   CBG:  Recent Labs Lab 04/30/13 1213 04/30/13 1655 04/30/13 2041  GLUCAP 357* 253* 315*   Coagulation:  Recent Labs Lab 04/30/13 0304 05/01/13 0915  LABPROT 15.9* 15.8*  INR 1.30 1.29   Urine Drug Screen: Drugs of Abuse     Component Value Date/Time   LABOPIA NONE DETECTED 01/26/2013 0036   LABOPIA SEE NOTE  07/21/2012 1631   COCAINSCRNUR NONE DETECTED 01/26/2013 0036   COCAINSCRNUR NEG 07/21/2012 1631   LABBENZ NONE DETECTED 01/26/2013 0036   LABBENZ NEG 07/21/2012 1631   LABBENZ NEG 06/28/2010 2130   AMPHETMU NONE DETECTED 01/26/2013 0036   AMPHETMU NEG 06/28/2010 2130   THCU NONE DETECTED 01/26/2013 0036   LABBARB NONE DETECTED 01/26/2013 0036   LABBARB NEG 07/21/2012 1631     Micro Results: No results found for this or any previous visit (from the past 240 hour(s)). Studies/Results: Dg Chest Port 1 View  04/30/2013  *RADIOLOGY REPORT*  Clinical Data: Shortness of breath.  PORTABLE CHEST - 1 VIEW  Comparison: Single view of the chest 01/25/2013 and PA and lateral chest 02/23/2013.  Findings: There is pulmonary vascular congestion without consolidative process, pneumothorax or effusion.  Heart size is upper normal.  No focal bony abnormality.  IMPRESSION: Pulmonary vascular congestion without acute abnormality.   Original Report Authenticated By: Holley Dexter, M.D.    Medications: I have reviewed the patient's current medications. Scheduled Meds: . albuterol  2.5 mg Nebulization Q6H   And  . ipratropium  0.5 mg Nebulization Q6H  . amLODipine  5 mg Oral Daily  . aspirin EC  81 mg Oral Daily  .  atenolol  100 mg Oral Daily  . atorvastatin  80 mg Oral q1800  . baclofen  10 mg Oral TID  . benazepril  40 mg Oral Daily  . buPROPion  150 mg Oral BID  . collagenase  1 application Topical Daily  . diphenhydrAMINE  25 mg Oral Q8H  . enoxaparin (LOVENOX) injection  130 mg Subcutaneous Q12H  . guaiFENesin  600 mg Oral BID  . insulin aspart  0-15 Units Subcutaneous TID WC  . insulin aspart  0-5 Units Subcutaneous QHS  . insulin aspart protamine- aspart  120 Units Subcutaneous Q breakfast  . insulin aspart protamine- aspart  65 Units Subcutaneous Q supper  . levofloxacin  750 mg Oral Daily  . nicotine  21 mg Transdermal Daily  . pantoprazole  40 mg Oral QAC breakfast  . predniSONE  60 mg Oral Q breakfast   . pregabalin  200 mg Oral TID  . silver sulfADIAZINE  1 application Topical Daily  . sodium chloride  3 mL Intravenous Q12H  . sodium chloride  3 mL Intravenous Q12H  . warfarin  7.5 mg Oral ONCE-1800  . Warfarin - Pharmacist Dosing Inpatient   Does not apply q1800   Continuous Infusions:  PRN Meds:.sodium chloride, albuterol, chlorpheniramine-HYDROcodone, ipratropium, oxyCODONE, oxyCODONE-acetaminophen, sodium chloride, zolpidem  Assessment/Plan: 1. Acute asthma exacerbation secondary to acute sinusitis with postnasal drip: On admission, increased dyspnea, cough, and sputum production initially thought consistent with COPD exacerbation. Prednisone 60 mg and levofloxacin along with scheduled nebulized treatments were intitated. On further evaluation, her sx seem more consistent with an asthma exacerbation 2/2 acute sinusitis. While she is on aggressive steroid therapy and abx, she is also  being treated with aggressive bronchodilators and steroids. Her acute sinusitis is also being treated with her chronic nasal steroids, Benadryl (in place of chlorpheniramine which is nonformulary), and levofloxacin. Respiratory sx have slightly improved today. - Albuterol and ipratropium nebulized q6 scheduled and q2 PRN  - Oral levofloxacin 750 mg daily  - Oral prednisone 60 mg daily  - Continue Mucinex 600 mg twice a day  - Benadryl 25mg  po q8h - Tussinex 5mL q12h PRN cough - Prednisone 60mg  po daily, taper over 10d, decrease to 40mg  tomorrow  2. Acute exacerbation of chronic diastolic congestive heart failure: Dyspnea and orthopnea for 5 days. Pulmonary congestion on chest radiograph. Mildly elevated proBNP at 332.4. She is not on furosemide at home, given 60 mg IV x2 doses on admission. Since admission, she is down 5lbs.  - IV furosemide 60 mg x1 today - Restarting home HCTZ - Straight I&O  - Daily weights   3. Chest pain: Likely musculoskeletal in nature arising from cough-induced injury.  Reproducible on exam. Troponins are all negative. EKG not concerning for acute ischemia.  - Troponin level   4. History of infective endocarditis: Blood cultures in February grew Staphylococcus aureus and Group B Streptococcus . 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 She completed treatment with Ancef. Repeat blood cultures on February 6, eighth, ninth, and in March were negative. Repeat TEE on March 20 was negative for vegetations. She is afebrile today with out leukocytosis.   5. 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 Started on warfarin with an expected length of treatment of 3 months. End date will be 06/10/2013. INR still subtherapeutic today.   - Warfarin per pharmacy dosing  - Bridging with enoxaparin per pharmacy  - Daily INR  6.  Type II diabetes mellitus: At home, she takes insulin lispro protamine-insulin lispro 75-25 120 units in the morning and 65 units in the evening. Her last hemoglobin A1c was 9.5 in February. A microalbumin to creatinine ratio was normal in July of last year. We are, we will have her on insulin aspart protamine-insulin aspart 70-30 at her home dose. CBGs elevated today, increasing SSI to resistant scale. - Insulin aspart protamine-insulin aspart 70-30 120 units with breakfast and 65 units with dinner  - SSI resistant  7. Hypertension: Improved, likely from diuresis. On atenolol 100 mg daily and benazepril 40 mg daily. Additional Lasix 60 IV given today, and restarted home HCTZ. - Atenolol 100 mg daily  - Benazepril 40 mg daily  - HCTZ 25mg  po BID  8. Hyperlipidemia: Last lipid panel was July 2013: cholesterol 189, triglycerides 247, HDL 31, LDL 109. Ten-year CVD risk is greater than 65%. Unsure if she is on pravastatin 40 or simvastatin 20 once a day.  - Atorvastatin 80 mg daily while hospitalized, consider discharging her on this medicine  - Continue aspirin 81 mg  daily   9. Depression and chronic pain: At home she takes bupropion 150 mg twice a day and pregabalin 200 mg 3 times a day. Continue home regimen  - Continue bupropion 150 mg twice a day  - Continue pregabalin 200 mg 3 times a day   10. Prophylaxis: Therapeutic warfarin and enoxaparin    Dispo: Disposition is deferred at this time, awaiting improvement of current medical problems.  Anticipated discharge in approximately 1-2 day(s).   The patient does have a current PCP Blanch Media, MD), therefore will be requiring [facility]PC follow-up after discharge.   The patient does not have transportation limitations that hinder transportation to clinic appointments.  .Services Needed at time of discharge: Y = Yes, Blank = No PT:   OT:   RN:   Equipment:   Other:     LOS: 1 day   Genelle Gather 05/01/2013, 11:52 AM

## 2013-05-01 NOTE — Progress Notes (Signed)
ANTICOAGULATION CONSULT NOTE - Follow Up Consult  Pharmacy Consult for coumadin/lovenox Indication: Recent hx of DVT  Allergies  Allergen Reactions  . Iohexol      Desc: IV CONTRAST CAUSE NEPHROPATHY IN 2007   . Morphine Sulfate Itching and Rash   Weight = 128 kg   Vital Signs: Temp: 97.4 F (36.3 C) (05/11 0518) Temp src: Oral (05/11 0518) BP: 145/83 mmHg (05/11 1025) Pulse Rate: 76 (05/11 0518)  Labs:  Recent Labs  04/30/13 0226 04/30/13 0304 04/30/13 0910 05/01/13 0450 05/01/13 0915  HGB 12.4  --   --   --   --   HCT 38.9  --   --   --   --   PLT 195  --   --   --   --   LABPROT  --  15.9*  --   --  15.8*  INR  --  1.30  --   --  1.29  CREATININE 0.80  --   --  0.90  --   TROPONINI  --   --  <0.30  --   --     Estimated Creatinine Clearance: 99 ml/min (by C-G formula based on Cr of 0.9).   Medical History: Past Medical History  Diagnosis Date  . Depression   . GERD (gastroesophageal reflux disease)   . Hyperlipidemia   . Hypertension   . Glaucoma   . PVD (peripheral vascular disease) with claudication     Stents to bilateral common iliac arteries (left 2005, right 2008), on chronic plavix  . Tinnitus   . Diabetic peripheral neuropathy   . Fibromyalgia   . Hyperplastic colon polyp 12/2010    Per colonoscopy (12/2010) - Dr. Arlyce Dice  . Diverticulosis   . Asthma   . COPD 01/08/2007    PFT's 05/2007 : FEV1/FVC 82, FEV1 64% pred, FEF 25-75% 40% predicted, 16% improvement in FEV1 with bronchodilators.     . Type II diabetes mellitus with peripheral circulatory disorders, uncontrolled (250.72) DX: 1993    Insulin dep. Poor control. Complicated by diabetic foot ulcer and diabetic eye disease.    . Chronic pain syndrome 12/03/2011    Likely secondary to depression, "fibromyalgia", neuropathy, and obesity.    . Tobacco abuse   . Chronic osteomyelitis of foot     chronic, right secondary to diabetic foot ulcers  . Polymicrobial bacterial infection 01/2013   GBS and S. aureus bacteremia // Source likely infected diabetic foot ulcer  . 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 Shortness of breath   . Chronic diastolic heart failure     grade 2 per 2D echocardiogram (01/2013)  . Ulcer of foot, chronic     Left. No OM per MRI (01/2013)  . DVT of upper extremity (deep vein thrombosis) 03/11/2013    Secondary to PICC line. Right brachial vein, diagnosed on 03/10/2013 Coumadin for 3 months. End date 06/10/2013     Assessment: 53 YOF presented with 5 days of dyspnea. Patient also has recent hx of rght brachial vein thrombosis (diagnosed 03/10/2013) d/u PICC line. She was started on coumadin and expected to treat for 3 months (end date 06/10/2013), INR subtherapeutic = 1.29 essentially unchanged after yesterdays 5mg  dose. Pharmacy is consulted to manage lovenox bridge to coumadin. Scr 0.9.  PTA dose: 2.5 mg on MWF, and 5mg  on all other days.  Goal  of Therapy:  INR 2-3 Monitor platelets by anticoagulation protocol: Yes   Plan:  - Continue Lovenox 130 mg SQ Q 12hrs - Coumadin 7.5mg  po x 1 - f/u daily INR   Vania Rea. Darin Engels.D. Clinical Pharmacist Pager [phone] Phone [phone] 05/01/2013 11:44 AM

## 2013-05-02 ENCOUNTER — Encounter: Payer: Medicare Other | Admitting: Internal Medicine

## 2013-05-02 ENCOUNTER — Ambulatory Visit: Payer: Medicare Other

## 2013-05-02 ENCOUNTER — Ambulatory Visit: Payer: Medicare Other | Admitting: Infectious Disease

## 2013-05-02 ENCOUNTER — Encounter: Payer: Medicare Other | Admitting: Dietician

## 2013-05-02 LAB — GLUCOSE, CAPILLARY
Glucose-Capillary: 75 mg/dL (ref 70–99)
Glucose-Capillary: 148 mg/dL — ABNORMAL HIGH (ref 70–99)
Glucose-Capillary: 272 mg/dL — ABNORMAL HIGH (ref 70–99)
Glucose-Capillary: 250 mg/dL — ABNORMAL HIGH (ref 70–99)

## 2013-05-02 LAB — EXPECTORATED SPUTUM ASSESSMENT W GRAM STAIN, RFLX TO RESP C: Special Requests: NORMAL

## 2013-05-02 LAB — PROTIME-INR
INR: 1.47 (ref 0.00–1.49)
Prothrombin Time: 17.4 seconds — ABNORMAL HIGH (ref 11.6–15.2)

## 2013-05-02 MED ORDER — PREDNISONE 20 MG PO TABS: 20.0000 mg | ORAL_TABLET | Freq: Every day | ORAL | Status: DC

## 2013-05-02 MED ORDER — WARFARIN SODIUM 7.5 MG PO TABS
7.5000 mg | ORAL_TABLET | Freq: Once | ORAL | Status: AC
  Administered 2013-05-02: 7.5 mg via ORAL
  Filled 2013-05-02: qty 1

## 2013-05-02 NOTE — Progress Notes (Signed)
ANTICOAGULATION CONSULT NOTE - Follow Up Consult  Pharmacy Consult for coumadin/lovenox Indication: Recent hx of DVT of UE  Allergies  Allergen Reactions  . Iohexol      Desc: IV CONTRAST CAUSE NEPHROPATHY IN 2007   . Morphine Sulfate Itching and Rash   Weight = 128 kg   Vital Signs: Temp: 98.1 F (36.7 C) (05/12 0504) Temp src: Oral (05/12 0504) BP: 136/64 mmHg (05/12 1044) Pulse Rate: 64 (05/12 1044)  Labs:  Recent Labs  04/30/13 0226 04/30/13 0304 04/30/13 0910 05/01/13 0450 05/01/13 0915 05/02/13 0455  HGB 12.4  --   --   --   --   --   HCT 38.9  --   --   --   --   --   PLT 195  --   --   --   --   --   LABPROT  --  15.9*  --   --  15.8* 17.4*  INR  --  1.30  --   --  1.29 1.47  CREATININE 0.80  --   --  0.90  --   --   TROPONINI  --   --  <0.30  --   --   --     Estimated Creatinine Clearance: 98.4 ml/min (by C-G formula based on Cr of 0.9).   Medical History: Past Medical History  Diagnosis Date  . Depression   . GERD (gastroesophageal reflux disease)   . Hyperlipidemia   . Hypertension   . Glaucoma   . PVD (peripheral vascular disease) with claudication     Stents to bilateral common iliac arteries (left 2005, right 2008), on chronic plavix  . Tinnitus   . Diabetic peripheral neuropathy   . Fibromyalgia   . Hyperplastic colon polyp 12/2010    Per colonoscopy (12/2010) - Dr. Arlyce Dice  . Diverticulosis   . Asthma   . COPD 01/08/2007    PFT's 05/2007 : FEV1/FVC 82, FEV1 64% pred, FEF 25-75% 40% predicted, 16% improvement in FEV1 with bronchodilators.     . Type II diabetes mellitus with peripheral circulatory disorders, uncontrolled (250.72) DX: 1993    Insulin dep. Poor control. Complicated by diabetic foot ulcer and diabetic eye disease.    . Chronic pain syndrome 12/03/2011    Likely secondary to depression, "fibromyalgia", neuropathy, and obesity.    . Tobacco abuse   . Chronic osteomyelitis of foot     chronic, right secondary to diabetic  foot ulcers  . Polymicrobial bacterial infection 01/2013    GBS and S. aureus bacteremia // Source likely infected diabetic foot ulcer  . 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 Shortness of breath   . Chronic diastolic heart failure     grade 2 per 2D echocardiogram (01/2013)  . Ulcer of foot, chronic     Left. No OM per MRI (01/2013)  . DVT of upper extremity (deep vein thrombosis) 03/11/2013    Secondary to PICC line. Right brachial vein, diagnosed on 03/10/2013 Coumadin for 3 months. End date 06/10/2013     Assessment: 80 YOF presented with 5 days of dyspnea. Patient also has recent hx of rght brachial vein thrombosis (diagnosed 03/10/2013) d/u PICC line. She was started on coumadin and expected to treat for 3 months (end date 06/10/2013).  INR was therapeutic on 04/04/13 outpt INR check up, but subtherapeutic (1.3)  on admission.   INR remains  subtherapeutic = 1.47 but increasing after Couamdin7.5mg  last pm.  No bleeding noted.  She is also on  lovenox 130mg  q12 bridge to INR > 2. Scr 0.9.  PTA dose: 2.5 mg on MWF, and 5mg  on all other days.  Goal of Therapy:  INR 2-3 Monitor platelets by anticoagulation protocol: Yes   Plan:  - Continue Lovenox 130 mg SQ Q 12hrs - Coumadin 7.5mg  x1 - Daily Protime  Leota Sauers Pharm.D. CPP, BCPS Clinical Pharmacist [phone] 05/02/2013 11:01 AM

## 2013-05-02 NOTE — Progress Notes (Signed)
Utilization Review Completed.   Lennix Kneisel, RN, BSN Nurse Case Manager  [phone]  

## 2013-05-02 NOTE — Progress Notes (Signed)
[facility]  Patient Status: Active (receiving services up to time of hospitalization)  [facility] is providing the following services: RN and PT  If patient discharges after hours, please call [phone].   Jennifer [name] 05/02/2013, 2:09 PM

## 2013-05-02 NOTE — ED Provider Notes (Signed)
Medical screening examination/treatment/procedure(s) were performed by non-physician practitioner and as supervising physician I was immediately available for consultation/collaboration.   Laray Anger, DO 05/02/13 1503

## 2013-05-02 NOTE — Progress Notes (Signed)
Patient is currently active with [facility] Care Management for chronic disease management services.  Patient has been engaged by a [employer], Pharm D, and LCSW.  Our community based plan of care has focused on disease management of diabetes, HTN, CHF, medication procurement/adherence, and community resource support for transportation.  Patients been provided a scale, transportation support, and medications have been purchased for her on last admission due to her inability to afford them.  Patient will receive a post discharge transition of care call and will be evaluated for monthly home visits for assessments and disease process education.  Made inpatient Case Manager Ivonne Andrew RN aware that [facility] Care Management following. Of note, [facility] Care Management services does not replace or interfere with any services that are arranged by inpatient case management or social work.  For additional questions or referrals please contact Anibal Henderson BSN RN [facility] Liaison at [phone].

## 2013-05-02 NOTE — Progress Notes (Signed)
Subjective: Pt states that she is still not feeling well today. She c/o of a severe productive cough, but is now able to cough up mucus.   Objective: Vital signs in last 24 hours: Filed Vitals:   05/01/13 2204 05/02/13 0104 05/02/13 0504 05/02/13 1044  BP: 149/70  149/73 136/64  Pulse: 98  64 64  Temp: 98.2 F (36.8 C)  98.1 F (36.7 C)   TempSrc: Oral  Oral   Resp: 20  20 20   Height:      Weight:   277 lb 1.9 oz (125.7 kg)   SpO2:  97% 98% 93%   Weight change: 95 lb 1.9 oz (43.145 kg)  Intake/Output Summary (Last 24 hours) at 05/02/13 1326 Last data filed at 05/02/13 0923  Gross per 24 hour  Intake    603 ml  Output   3800 ml  Net  -3197 ml   Vitals reviewed. General: Sitting on the side of the bed, NAD HEENT: PERRL, EOMI, no scleral icterus Cardiac: RRR, no rubs, murmurs or gallops Pulm: Diffusely course breath sounds, with end-expiratory wheezing, productive cough Abd: Obese, soft, nontender, non-distended Ext: Venous stasis changes to skin with woody appearance, non-pitting edema of BLE, non-tender. Neuro: Alert and oriented X3, nonfocal  Lab Results: Basic Metabolic Panel:  Recent Labs Lab 04/30/13 0226 05/01/13 0450  NA 142 138  K 4.0 4.3  CL 103 98  CO2 31 32  GLUCOSE 72 238*  BUN 12 22  CREATININE 0.80 0.90  CALCIUM 8.9 9.7   CBC:  Recent Labs Lab 04/30/13 0226  WBC 7.5  HGB 12.4  HCT 38.9  MCV 94.0  PLT 195   Cardiac Enzymes:  Recent Labs Lab 04/30/13 0910  TROPONINI <0.30   BNP:  Recent Labs Lab 04/30/13 0226  PROBNP 332.4*   CBG:  Recent Labs Lab 04/30/13 2041 05/01/13 1047 05/01/13 1550 05/01/13 2143 05/02/13 0602 05/02/13 1129  GLUCAP 315* 150* 339* 252* 75 148*   Coagulation:  Recent Labs Lab 04/30/13 0304 05/01/13 0915 05/02/13 0455  LABPROT 15.9* 15.8* 17.4*  INR 1.30 1.29 1.47   Urine Drug Screen: Drugs of Abuse     Component Value Date/Time   LABOPIA NONE DETECTED 01/26/2013 0036   LABOPIA SEE NOTE  07/21/2012 1631   COCAINSCRNUR NONE DETECTED 01/26/2013 0036   COCAINSCRNUR NEG 07/21/2012 1631   LABBENZ NONE DETECTED 01/26/2013 0036   LABBENZ NEG 07/21/2012 1631   LABBENZ NEG 06/28/2010 2130   AMPHETMU NONE DETECTED 01/26/2013 0036   AMPHETMU NEG 06/28/2010 2130   THCU NONE DETECTED 01/26/2013 0036   LABBARB NONE DETECTED 01/26/2013 0036   LABBARB NEG 07/21/2012 1631     Micro Results: Recent Results (from the past 240 hour(s))  CULTURE, EXPECTORATED SPUTUM-ASSESSMENT     Status: None   Collection Time    05/02/13 11:06 AM      Result Value Range Status   Specimen Description SPUTUM   Final   Special Requests Normal   Final   Sputum evaluation     Final   Value: MICROSCOPIC FINDINGS SUGGEST THAT THIS SPECIMEN IS NOT REPRESENTATIVE OF LOWER RESPIRATORY SECRETIONS. PLEASE RECOLLECT.     CALLED TO A [place]BIT[place] ON 4700 05/02/13 1305 BY K SCHULTZ   Report Status 05/02/2013 FINAL   Final   Studies/Results: No results found. Medications: I have reviewed the patient's current medications. Scheduled Meds: . albuterol  2.5 mg Nebulization Q6H   And  . ipratropium  0.5 mg Nebulization Q6H  .  amLODipine  5 mg Oral Daily  . aspirin EC  81 mg Oral Daily  . atenolol  100 mg Oral Daily  . atorvastatin  80 mg Oral q1800  . baclofen  10 mg Oral TID  . benazepril  40 mg Oral Daily  . buPROPion  150 mg Oral BID  . collagenase  1 application Topical Daily  . diphenhydrAMINE  25 mg Oral Q8H  . enoxaparin (LOVENOX) injection  130 mg Subcutaneous Q12H  . guaiFENesin  600 mg Oral BID  . hydrochlorothiazide  25 mg Oral Daily  . insulin aspart  0-20 Units Subcutaneous TID AC & HS  . insulin aspart protamine- aspart  120 Units Subcutaneous Q breakfast  . insulin aspart protamine- aspart  65 Units Subcutaneous Q supper  . levofloxacin  750 mg Oral Daily  . nicotine  21 mg Transdermal Daily  . pantoprazole  40 mg Oral QAC breakfast  . predniSONE  40 mg Oral Q breakfast  . pregabalin  200 mg Oral TID  .  silver sulfADIAZINE  1 application Topical Daily  . sodium chloride  3 mL Intravenous Q12H  . sodium chloride  3 mL Intravenous Q12H  . warfarin  7.5 mg Oral ONCE-1800  . Warfarin - Pharmacist Dosing Inpatient   Does not apply q1800   Continuous Infusions:  PRN Meds:.sodium chloride, albuterol, chlorpheniramine-HYDROcodone, ipratropium, oxyCODONE, oxyCODONE-acetaminophen, sodium chloride, traZODone  Assessment/Plan: 1. Acute asthma exacerbation secondary to acute sinusitis with postnasal drip: On admission, increased dyspnea, cough, and sputum production initially thought consistent with COPD exacerbation. Prednisone 60 mg and levofloxacin along with scheduled nebulized treatments were intitated. On further evaluation, her sx seem more consistent with an asthma exacerbation 2/2 acute sinusitis. While she is on aggressive steroid therapy and abx, she is also being treated with aggressive bronchodilators and steroids. Her acute sinusitis is also being treated with her chronic nasal steroids, Benadryl (in place of chlorpheniramine which is nonformulary), and levofloxacin. Respiratory sx have slightly improved today. - Albuterol and ipratropium nebulized q6 scheduled and q2 PRN  - Oral levofloxacin 750 mg daily  - Oral prednisone 40 mg daily, tapering  - Continue Mucinex 600 mg twice a day  - Benadryl 25mg  po q8h - Tussinex 5mL q12h PRN cough  2. Acute exacerbation of chronic diastolic congestive heart failure: Dyspnea and orthopnea for 5 days. Pulmonary congestion on chest radiograph. Mildly elevated proBNP at 332.4. She is not on furosemide at home, given 60 mg IV x3 doses this hospitalization. Since admission, she is down 5lbs.  - Lasix 60IV - Home HCTZ - Straight I&O  - Daily weights   3. Chest pain: Likely musculoskeletal in nature arising from cough-induced injury. Reproducible on exam. Troponins are all negative. EKG not concerning for acute ischemia.  - Troponin level   4. History of  infective endocarditis: Blood cultures in February grew Staphylococcus aureus and Group B Streptococcus . 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 She completed treatment with Ancef. Repeat blood cultures on February 6, eighth, ninth, and in March were negative. Repeat TEE on March 20 was negative for vegetations. She is afebrile today with out leukocytosis.   5. 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 Started on warfarin with an expected length of treatment of 3 months. End date will be 06/10/2013. INR still subtherapeutic today.   - Warfarin per pharmacy dosing  - Bridging with enoxaparin per pharmacy  - Daily INR  6. Type II diabetes mellitus: At home, she takes insulin lispro protamine-insulin lispro 75-25 120 units in the morning and 65 units in the evening. Her last hemoglobin A1c was 9.5 in February. A microalbumin to creatinine ratio was normal in July of last year. We are, we will have her on insulin aspart protamine-insulin aspart 70-30 at her home dose. CBGs elevated today, increasing SSI to resistant scale. - Insulin aspart protamine-insulin aspart 70-30 120 units with breakfast and 65 units with dinner  - SSI resistant  7. Hypertension: Improved, likely from diuresis. On atenolol 100 mg daily and benazepril 40 mg daily. Additional Lasix 60 IV given today, and restarted home HCTZ. - Atenolol 100 mg daily  - Benazepril 40 mg daily  - HCTZ 25mg  po BID  8. Hyperlipidemia: Last lipid panel was July 2013: cholesterol 189, triglycerides 247, HDL 31, LDL 109. Ten-year CVD risk is greater than 65%. Unsure if she is on pravastatin 40 or simvastatin 20 once a day.  - Atorvastatin 80 mg daily while hospitalized, consider discharging her on this medicine  - Continue aspirin 81 mg daily   9. Depression and chronic pain: At home she takes bupropion 150 mg twice a day and pregabalin 200 mg 3 times a day.  Continue home regimen  - Continue bupropion 150 mg twice a day  - Continue pregabalin 200 mg 3 times a day   10. Prophylaxis: Therapeutic warfarin and enoxaparin    Dispo: Disposition is deferred at this time, awaiting improvement of current medical problems.  Anticipated discharge in approximately 1-2 day(s).   The patient does have a current PCP Blanch Media, MD), therefore will be requiring [facility]PC follow-up after discharge.   The patient does not have transportation limitations that hinder transportation to clinic appointments.  .Services Needed at time of discharge: Y = Yes, Blank = No PT:   OT:   RN:   Equipment:   Other:     LOS: 2 days   Genelle Gather 05/02/2013, 1:26 PM

## 2013-05-03 ENCOUNTER — Other Ambulatory Visit: Payer: Self-pay | Admitting: Internal Medicine

## 2013-05-03 DIAGNOSIS — J449 Chronic obstructive pulmonary disease, unspecified: Secondary | ICD-10-CM

## 2013-05-03 DIAGNOSIS — J4489 Other specified chronic obstructive pulmonary disease: Secondary | ICD-10-CM

## 2013-05-03 LAB — PROTIME-INR
INR: 1.92 — ABNORMAL HIGH (ref 0.00–1.49)
Prothrombin Time: 21.2 seconds — ABNORMAL HIGH (ref 11.6–15.2)

## 2013-05-03 LAB — GLUCOSE, CAPILLARY: Glucose-Capillary: 150 mg/dL — ABNORMAL HIGH (ref 70–99)

## 2013-05-03 MED ORDER — ALBUTEROL SULFATE (5 MG/ML) 0.5% IN NEBU: 2.5000 mg | INHALATION_SOLUTION | Freq: Four times a day (QID) | RESPIRATORY_TRACT | Status: DC | PRN

## 2013-05-03 MED ORDER — IPRATROPIUM BROMIDE 0.02 % IN SOLN: 0.5000 mg | Freq: Four times a day (QID) | RESPIRATORY_TRACT | Status: DC | PRN

## 2013-05-03 MED ORDER — WARFARIN SODIUM 5 MG PO TABS
5.0000 mg | ORAL_TABLET | Freq: Once | ORAL | Status: DC
  Filled 2013-05-03: qty 1

## 2013-05-03 MED ORDER — FUROSEMIDE 20 MG PO TABS: 20.0000 mg | ORAL_TABLET | Freq: Every day | ORAL | Status: DC

## 2013-05-03 MED ORDER — DIPHENHYDRAMINE HCL 25 MG PO TABS: 25.0000 mg | ORAL_TABLET | Freq: Three times a day (TID) | ORAL | Status: DC

## 2013-05-03 MED ORDER — ALBUTEROL SULFATE (5 MG/ML) 0.5% IN NEBU: 2.5000 mg | INHALATION_SOLUTION | Freq: Four times a day (QID) | RESPIRATORY_TRACT | Status: DC

## 2013-05-03 MED ORDER — LEVOFLOXACIN 750 MG PO TABS: 750.0000 mg | ORAL_TABLET | Freq: Every day | ORAL | Status: DC

## 2013-05-03 MED ORDER — HYDROCOD POLST-CHLORPHEN POLST 10-8 MG/5ML PO LQCR: 5.0000 mL | Freq: Two times a day (BID) | ORAL | Status: DC | PRN

## 2013-05-03 MED ORDER — GUAIFENESIN ER 600 MG PO TB12: 600.0000 mg | ORAL_TABLET | Freq: Two times a day (BID) | ORAL | Status: DC

## 2013-05-03 MED ORDER — IPRATROPIUM BROMIDE 0.02 % IN SOLN: 0.5000 mg | Freq: Four times a day (QID) | RESPIRATORY_TRACT | Status: DC

## 2013-05-03 MED ORDER — FUROSEMIDE 20 MG PO TABS
20.0000 mg | ORAL_TABLET | Freq: Every day | ORAL | Status: DC
  Administered 2013-05-03: 20 mg via ORAL
  Filled 2013-05-03: qty 1

## 2013-05-03 MED ORDER — PREDNISONE 20 MG PO TABS: 20.0000 mg | ORAL_TABLET | Freq: Every day | ORAL | Status: DC

## 2013-05-03 NOTE — Progress Notes (Signed)
Spoke with patient at bedside she is anticipating going home today.  Contacted [facility] hospital liaison to request a RN assessment for telehealth monitoring.  Patient has a new diagnosis for CHF.  She has a scale at home but may benefit from the accountability of a telescale to improve compliance and reduce her risk of readmission.  Of note, [facility] Care Management services does not replace or interfere with any services that are arranged by inpatient case management or social work.  For additional questions or referrals please contact Anibal Henderson BSN RN [facility] Liaison at [phone].

## 2013-05-03 NOTE — Progress Notes (Signed)
1630 discharge instructions and prescriptions given to pt. Verbalized understanding . Wheeled to lobby by NT . Son  And spouse with pt

## 2013-05-03 NOTE — Discharge Instructions (Signed)
**  Continue the antibiotics for 4 more days, starting tomorrow **Continue to take the Prednisone according to the following schedule: 40mg  (2 tablets) once a day on 5/14  20mg  (1 tablet) once a day on 5/15, 5/16, and 5/17 10mg  (1/2 tablet) once a day on 5/18 and 5/19  Continue nebulizer treatments every 6-8hrs as needed.  **With the Coumadin, take 5mg  tomorrow (5/14), but do not take a dose on Thursday (5/15) until you see Dr. Alexandria Lodge at 3pm- he will tell you what dose to take.

## 2013-05-03 NOTE — Progress Notes (Signed)
Internal Medicine Teaching Service Attending Note Date: 05/03/2013  Patient name: Jennifer [name]  Medical record number: [id number]  Date of birth: 01-03-1961    This patient has been seen and discussed with the house staff. Please see their note for complete details. I concur with their findings with the following additions/corrections:  Anticipate discharge home today.  [name] is greatly improved compared to admission.  She is on oral antibiotics and steroids.  She will receive a nebulizer at home for her continued therapy.   Tarvaris Puglia 05/03/2013, 2:37 PM

## 2013-05-03 NOTE — Progress Notes (Signed)
Internal Medicine Teaching Service Attending Note Date: 05/03/2013  Patient name: Jennifer [name]  Medical record number: [id number]  Date of birth: Jan 16, 1961    This patient has been seen and discussed with the house staff. Please see their note for complete details. I concur with their findings with the following additions/corrections:  This is a late note for 5/12, when I saw [name] with the medicine team. She was improved compared to admission and close to discharge.   Kami Kube 05/03/2013, 11:31 AM

## 2013-05-03 NOTE — Progress Notes (Signed)
Guaifenesin  600mg  rx called to [facility].

## 2013-05-03 NOTE — Discharge Summary (Signed)
Internal Medicine Teaching [facility] Discharge Note  Name: Jennifer [name] MRN: [id number] DOB: 11-01-1961 52 y.o.  Date of Admission: 04/30/2013  2:17 AM Date of Discharge: 05/03/2013 Attending Physician: Dr. Criselda Peaches  Discharge Diagnosis: Principal Problem:   Asthma with acute exacerbation Active Problems:   Type II diabetes mellitus with peripheral circulatory disorders, uncontrolled (250.72)   Hyperlipidemia   HYPERTENSION   Severe obesity (BMI >= 40)   Infective endocarditis   Acute on chronic diastolic CHF (congestive heart failure)   Acute sinusitis   Post-nasal drip   Discharge Medications:   Medication List    STOP taking these medications       hydrochlorothiazide 25 MG tablet  Commonly known as:  HYDRODIURIL     oxyCODONE-acetaminophen 7.5-325 MG per tablet  Commonly known as:  PERCOCET      TAKE these medications       albuterol 108 (90 BASE) MCG/ACT inhaler  Commonly known as:  PROAIR HFA  Inhale 2 puffs into the lungs every 6 (six) hours as needed for wheezing.     albuterol (5 MG/ML) 0.5% nebulizer solution  Commonly known as:  PROVENTIL  Take 0.5 mLs (2.5 mg total) by nebulization every 6 (six) hours as needed for wheezing.     aspirin EC 81 MG tablet  Take 81 mg by mouth every morning.     atenolol 100 MG tablet  Commonly known as:  TENORMIN  Take 100 mg by mouth every morning.     baclofen 10 MG tablet  Commonly known as:  LIORESAL  Take 10 mg by mouth 3 (three) times daily.     benazepril 40 MG tablet  Commonly known as:  LOTENSIN  Take 40 mg by mouth every morning.     buPROPion 150 MG 12 hr tablet  Commonly known as:  WELLBUTRIN SR  Take 1 tablet (150 mg total) by mouth 2 (two) times daily.     chlorpheniramine-HYDROcodone 10-8 MG/5ML Lqcr  Commonly known as:  TUSSIONEX  Take 5 mLs by mouth every 12 (twelve) hours as needed.     collagenase ointment  Commonly known as:  SANTYL  Apply 1 application topically daily.      diphenhydrAMINE 25 MG tablet  Commonly known as:  BENADRYL  Take 1 tablet (25 mg total) by mouth every 8 (eight) hours.     esomeprazole 20 MG capsule  Commonly known as:  NEXIUM  Take 1 capsule (20 mg total) by mouth daily before breakfast.     fluticasone 50 MCG/ACT nasal spray  Commonly known as:  FLONASE  Place 2 sprays into the nose 2 (two) times daily as needed for allergies.     Fluticasone-Salmeterol 250-50 MCG/DOSE Aepb  Commonly known as:  ADVAIR DISKUS  Inhale 1 puff into the lungs 2 (two) times daily.     furosemide 20 MG tablet  Commonly known as:  LASIX  Take 1 tablet (20 mg total) by mouth daily.     glucose blood test strip  Check blood sugar up to 4 times a day before meals and bedtime Dx code- 250.72, insulin requiring     guaiFENesin 600 MG 12 hr tablet  Commonly known as:  MUCINEX  Take 1 tablet (600 mg total) by mouth 2 (two) times daily.     HUMALOG MIX 75/25 KWIKPEN (75-25) 100 UNIT/ML Supn  Generic drug:  Insulin Lispro Prot & Lispro  Inject 65-120 Units into the skin 2 (two) times daily. 120 units in the  morning and 65 units in the evening     ipratropium 0.02 % nebulizer solution  Commonly known as:  ATROVENT  Take 2.5 mLs (0.5 mg total) by nebulization every 6 (six) hours as needed for wheezing.     levofloxacin 750 MG tablet  Commonly known as:  LEVAQUIN  Take 1 tablet (750 mg total) by mouth daily.     metFORMIN 1000 MG tablet  Commonly known as:  GLUCOPHAGE  Take 1,000 mg by mouth 2 (two) times daily.     mometasone-formoterol 100-5 MCG/ACT Aero  Commonly known as:  DULERA  Inhale 2 puffs into the lungs 2 (two) times daily.     pravastatin 40 MG tablet  Commonly known as:  PRAVACHOL  Take 40 mg by mouth every morning.     predniSONE 20 MG tablet  Commonly known as:  DELTASONE  Take 1 tablet (20 mg total) by mouth daily with breakfast.     pregabalin 200 MG capsule  Commonly known as:  LYRICA  Take 1 capsule (200 mg total) by mouth  3 (three) times daily.     silver sulfADIAZINE 1 % cream  Commonly known as:  SILVADENE  Apply 1 application topically daily. Apply to feet after soaking     simvastatin 20 MG tablet  Commonly known as:  ZOCOR  Take 1 tablet (20 mg total) by mouth daily at 6 PM.        Disposition and follow-up:   Ms.Alaura L Hodgens was discharged from [facility] in Stable condition.  At the hospital follow up visit please address  - the improvement of her respiratory symptoms - how she is doing on the Lasix - her blood pressure control, might need to change to more selective B antagonist. - Check a CBG, as she has been on steroids and required SSI in addition to 70/30 dosed as her home dosing in the hospital - Please ask which statin she is taking at home, the Pravastatin or the simvastatin  Follow-up Appointments: Follow-up Information   Follow up with Gar Ponto, [facility] On 05/05/2013. (3pm to monitor coumadin)    Contact information:   [address] [place] [address] [phone]       Follow up with Almyra Deforest, MD On 05/11/2013. (10:45am for hospital follow up)    Contact information:   [address] [place] [address] [phone]      Discharge Orders   Future Appointments Provider Department Dept Phone   05/11/2013 10:45 AM Almyra Deforest, MD [facility] [phone]   Future Orders Complete By Expires     Call MD for:  difficulty breathing, headache or visual disturbances  As directed     Call MD for:  extreme fatigue  As directed     Call MD for:  persistant dizziness or light-headedness  As directed     Call MD for:  temperature >100.4  As directed     Diet - low sodium heart healthy  As directed     Increase activity slowly  As directed        Consultations:    Procedures Performed:  Dg Chest Port 1 View  04/30/2013  *RADIOLOGY REPORT*  Clinical Data: Shortness of breath.  PORTABLE  CHEST - 1 VIEW  Comparison: Single view of the chest 01/25/2013 and PA and lateral chest 02/23/2013.  Findings: There is pulmonary vascular congestion without consolidative process, pneumothorax or effusion.  Heart size is  upper normal.  No focal bony abnormality.  IMPRESSION: Pulmonary vascular congestion without acute abnormality.   Origi  Admission History of Present Illness: This is a 52 year old woman with hypertension, diastolic heart failure, COPD, and a recent history of infective endocarditis who comes to the emergency department with 5 days of dyspnea. For 5 days now she has been dyspneic with exertion, now dyspneic at rest, and with significant orthopnea. She is unable to lie down at all. Over the same time, she has had a worsening cough that is producing yellow phlegm. She reports subjective fevers but objectively had a temperature measured less than 100F at home by her home health RN. She also reports chest pain. Onset 2 days ago. Provoked by coughing. Quality described as pressure. Location is mid chest without radiation. Severity is moderate. Timing is constant since onset. Aggravated by coughing. No alleviating factors. No nausea or diaphoresis.    Review of Systems:  Constitutional: Positive for chills. Negative for fever and diaphoresis.  Respiratory: Positive for cough, sputum production and shortness of breath.  Cardiovascular: Positive for chest pain, orthopnea and leg swelling.  Gastrointestinal: Negative for nausea, vomiting, abdominal pain, diarrhea, constipation and blood in stool.  Genitourinary: Negative for dysuria and hematuria.  Skin: Negative for rash.  Lymph: No lymphadenopathy.    Physical exam:  VITALS: BP 190/96, HR 81, RR 22, temp 99.47F, SpO2 95% on 2 L [place]  GENERAL: obese, in respiratory distress  HEAD: atraumatic, normocephalic  EYES: pupils equal, round and reactive; sclera anicteric; normal conjunctiva  EARS: canals patent and TMs normal bilaterally   NOSE/THROAT: oropharynx clear, moist mucous membranes, pink gums  NECK: supple, thyroid normal in size and without palpable nodules  LYMPH: no cervical or supraclavicular lymphadenopathy  LUNGS: expiratory wheezing heard throughout, no rales, increased work of breathing  HEART: unable to auscultate heart due to tachypnea and breath sounds  MSK: pain with palpation of the anterior chest wall  PULSES: radial 2+ and symmetric  ABDOMEN: soft, non-tender, normal bowel sounds, no masses or organomegaly  SKIN: warm, dry, intact, normal turgor, changes consistent with venous stasis on lower legs bilaterally  EXTREMITIES: 1+, no clubbing or cyanosis  PSYCH: patient is alert and oriented, mood and affect are normal and congruent     Hospital Course by problem list:  1. Acute asthma exacerbation secondary to acute sinusitis with postnasal drip: On admission, increased dyspnea, cough, and sputum production initially thought consistent with COPD exacerbation. Prednisone 60 mg with a 10 day taper and levofloxacin along with scheduled nebulized treatments were intitated. On further evaluation, her sx seem more consistent with an asthma exacerbation 2/2 acute sinusitis. While she is on aggressive steroid therapy and abx, she is also being treated with aggressive bronchodilators and inhaled steroids. Her acute sinusitis was treated with her chronic nasal steroids, Benadryl (in place of chlorpheniramine which is nonformulary), and the levofloxacin x7days total. Her respiratory symptoms greatly improved by HD#4, and she ws ready for d/c home. A nebulizer for home use was also ordered per pt request. She will need a formal pulmonary function evaluation 4-6 weeks from this acute exacerbation to help define her pulmonary disease.  - Albuterol and ipratropium nebulized q6 PRN - Albuterol inhaler 1-2 puffs q4-6hrs PRN  - Oral levofloxacin 750 mg daily, x4 more days for a total of 7 days  - Oral prednisone 40 mg on  5/14, then 20mg  for 3 days, then 10mg  for 2 days, for a 10 day taper - Continue Mucinex  600 mg twice a day  - Benadryl 25mg  po q8h PRN - Tussinex 5mL q12h PRN cough   2. Acute exacerbation of chronic diastolic congestive heart failure: Dyspnea and orthopnea for 5 days. Pulmonary congestion on chest radiograph. Mildly elevated proBNP at 332.4. She is not on furosemide at home, but was given Lasix this admission, and she is down 10lbs. Lasix 20mg  po daily was continued at discharge, which may need to be increased depending on her BLE edema. Because the Lasix was initiated this admission, her HCTZ was stopped. Her home Atenolol may need to be changed to a more selective B-antagonist if her respiratory symptoms persist.  - Lasix 20 po daily  - Stopping home HCTZ  - Continue home Atenolol  3. Chest pain: Resolved. Likely musculoskeletal in nature arising from cough-induced injury. Reproducible on exam. Troponins are all negative. EKG not concerning for acute ischemia.   4. History of infective endocarditis: Blood cultures in February grew Staphylococcus aureus and Group B Streptococcus . 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 She completed treatment with Ancef. Repeat blood cultures on February 6, eighth, ninth, and in March were negative. Repeat TEE on March 20 was negative for vegetations. She has remained afebrile with out leukocytosis.   5. 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 Started on warfarin with an expected length of treatment of 3 months. End date will be 06/10/2013. INR subtherapeutic on admission, and she was bridged with Lovenox. Her INR is 1.92 today. Will continue the Coumadin at 5mg  tomorrow, and then she is to follow up with Dr. Alexandria Lodge on Thursday, 5/15 before taking her Thursday dose of Coumadin.  - Warfarin 5mg  on 5/15, hold Coumadin dose until seeing Dr. Alexandria Lodge on 5/16.  6. Type II  diabetes mellitus: At home, she takes insulin lispro protamine-insulin lispro 75-25 120 units in the morning and 65 units in the evening. Her last hemoglobin A1c was 9.5 in February. A microalbumin to creatinine ratio was normal in July of last year. We have her on insulin aspart protamine-insulin aspart 70-30 at her home dose of 120 units with breakfast and 65 units with dinner. CBGs still elevated, likely 2/2 to the steroids. She is to resume her home medications at discharge. - Resume Humalog 75/25 120 units with breakfast and 65 units with dinner   7. Hypertension: On atenolol 100 mg daily and benazepril 40 mg daily. She has received IV Lasix this admission and her home HCTZ was restarted. Per Ms. [name], she feels that the Lasix improves her BLE, and that the HCTZ is not very effective, so we continued the Lasix and stopped the HCTZ. The Amlodipine was also stopped due to her continued cough.  - Atenolol 100 mg daily  - Benazepril 40 mg daily  - Lasix 20mg  po daily  - Stopped HCTZ   8. Hyperlipidemia: Last lipid panel was July 2013: cholesterol 189, triglycerides 247, HDL 31, LDL 109. Ten-year CVD risk is greater than 65%. Unsure if she is on pravastatin 40 or simvastatin 20 once a day.  - Continue statin therapy  - Continue aspirin 81 mg daily   9. Depression and chronic pain: At home she takes bupropion 150 mg twice a day and pregabalin 200 mg 3 times a day. Continue home regimen- apparently she is also on Percocet 7.5-325 q6h PRN pain, which was inadvertently discontinued at discharge.  - Continue bupropion 150 mg twice a day  -  Continue pregabalin 200 mg 3 times a day  - Continue Percocet 7.5-325mg  q6h PRN pain   Discharge Vitals:  BP 134/70  Pulse 62  Temp(Src) 97.9 F (36.6 C) (Oral)  Resp 19  Ht 5\' 7"  (1.702 m)  Wt 275 lb 4.8 oz (124.875 kg)  BMI 43.11 kg/m2  SpO2 98%  Discharge Labs:  No results found for this or any previous visit (from the past 24  hour(s)).  Signed: Genelle Gather 05/06/2013, 3:49 PM   Time Spent on Discharge: Services Ordered on Discharge: None Equipment Ordered on Discharge: None

## 2013-05-03 NOTE — Progress Notes (Signed)
Subjective: Pt states that she is feeling better today. She still c/o of a severe productive cough, that has resulted in some incontinence.   Objective: Vital signs in last 24 hours: Filed Vitals:   05/02/13 1927 05/02/13 2157 05/03/13 0125 05/03/13 0615  BP:  142/70  131/73  Pulse: 62 64 65 70  Temp:  97.6 F (36.4 C)  97.4 F (36.3 C)  TempSrc:  Oral  Oral  Resp: 18 18 18 18   Height:      Weight:    275 lb 4.8 oz (124.875 kg)  SpO2: 99% 97% 98% 99%   Weight change: -1 lb 13.1 oz (-0.825 kg)  Intake/Output Summary (Last 24 hours) at 05/03/13 0859 Last data filed at 05/03/13 0846  Gross per 24 hour  Intake   1823 ml  Output   1450 ml  Net    373 ml   Vitals reviewed. General: Sitting on the side of the bed, NAD HEENT: PERRL, EOMI, no scleral icterus Cardiac: RRR, no rubs, murmurs or gallops Pulm: Diffusely course breath sounds, with end-expiratory wheezing, productive cough Abd: Obese, soft, nontender, non-distended Ext: Venous stasis changes to skin with woody appearance, non-pitting edema of BLE- improved from admission, non-tender. Neuro: Alert and oriented X3, nonfocal  Lab Results: Basic Metabolic Panel:  Recent Labs Lab 04/30/13 0226 05/01/13 0450  NA 142 138  K 4.0 4.3  CL 103 98  CO2 31 32  GLUCOSE 72 238*  BUN 12 22  CREATININE 0.80 0.90  CALCIUM 8.9 9.7   CBC:  Recent Labs Lab 04/30/13 0226  WBC 7.5  HGB 12.4  HCT 38.9  MCV 94.0  PLT 195   Cardiac Enzymes:  Recent Labs Lab 04/30/13 0910  TROPONINI <0.30   BNP:  Recent Labs Lab 04/30/13 0226  PROBNP 332.4*   CBG:  Recent Labs Lab 05/01/13 2143 05/02/13 0602 05/02/13 1129 05/02/13 1638 05/02/13 2155 05/03/13 0600  GLUCAP 252* 75 148* 272* 250* 150*   Coagulation:  Recent Labs Lab 04/30/13 0304 05/01/13 0915 05/02/13 0455 05/03/13 0440  LABPROT 15.9* 15.8* 17.4* 21.2*  INR 1.30 1.29 1.47 1.92*   Urine Drug Screen: Drugs of Abuse     Component Value Date/Time    LABOPIA NONE DETECTED 01/26/2013 0036   LABOPIA SEE NOTE 07/21/2012 1631   COCAINSCRNUR NONE DETECTED 01/26/2013 0036   COCAINSCRNUR NEG 07/21/2012 1631   LABBENZ NONE DETECTED 01/26/2013 0036   LABBENZ NEG 07/21/2012 1631   LABBENZ NEG 06/28/2010 2130   AMPHETMU NONE DETECTED 01/26/2013 0036   AMPHETMU NEG 06/28/2010 2130   THCU NONE DETECTED 01/26/2013 0036   LABBARB NONE DETECTED 01/26/2013 0036   LABBARB NEG 07/21/2012 1631     Micro Results: Recent Results (from the past 240 hour(s))  CULTURE, EXPECTORATED SPUTUM-ASSESSMENT     Status: None   Collection Time    05/02/13 11:06 AM      Result Value Range Status   Specimen Description SPUTUM   Final   Special Requests Normal   Final   Sputum evaluation     Final   Value: MICROSCOPIC FINDINGS SUGGEST THAT THIS SPECIMEN IS NOT REPRESENTATIVE OF LOWER RESPIRATORY SECRETIONS. PLEASE RECOLLECT.     CALLED TO A MAGBITANG ON 4700 05/02/13 1305 BY K SCHULTZ   Report Status 05/02/2013 FINAL   Final   Studies/Results: No results found. Medications: I have reviewed the patient's current medications. Scheduled Meds: . albuterol  2.5 mg Nebulization Q6H   And  . ipratropium  0.5  mg Nebulization Q6H  . amLODipine  5 mg Oral Daily  . aspirin EC  81 mg Oral Daily  . atenolol  100 mg Oral Daily  . atorvastatin  80 mg Oral q1800  . baclofen  10 mg Oral TID  . benazepril  40 mg Oral Daily  . buPROPion  150 mg Oral BID  . collagenase  1 application Topical Daily  . diphenhydrAMINE  25 mg Oral Q8H  . enoxaparin (LOVENOX) injection  130 mg Subcutaneous Q12H  . guaiFENesin  600 mg Oral BID  . hydrochlorothiazide  25 mg Oral Daily  . insulin aspart  0-20 Units Subcutaneous TID AC & HS  . insulin aspart protamine- aspart  120 Units Subcutaneous Q breakfast  . insulin aspart protamine- aspart  65 Units Subcutaneous Q supper  . levofloxacin  750 mg Oral Daily  . nicotine  21 mg Transdermal Daily  . pantoprazole  40 mg Oral QAC breakfast  . [START ON  05/05/2013] predniSONE  20 mg Oral Q breakfast  . predniSONE  40 mg Oral Q breakfast  . pregabalin  200 mg Oral TID  . silver sulfADIAZINE  1 application Topical Daily  . sodium chloride  3 mL Intravenous Q12H  . sodium chloride  3 mL Intravenous Q12H  . Warfarin - Pharmacist Dosing Inpatient   Does not apply q1800   Continuous Infusions:  PRN Meds:.sodium chloride, albuterol, chlorpheniramine-HYDROcodone, ipratropium, oxyCODONE, oxyCODONE-acetaminophen, sodium chloride, traZODone  Assessment/Plan: 1. Acute asthma exacerbation secondary to acute sinusitis with postnasal drip: On admission, increased dyspnea, cough, and sputum production initially thought consistent with COPD exacerbation. Prednisone 60 mg and levofloxacin along with scheduled nebulized treatments were intitated. On further evaluation, her sx seem more consistent with an asthma exacerbation 2/2 acute sinusitis. While she is on aggressive steroid therapy and abx, she is also being treated with aggressive bronchodilators and steroids. Her acute sinusitis is also being treated with her chronic nasal steroids, Benadryl (in place of chlorpheniramine which is nonformulary), and levofloxacin. Respiratory sx are improved, and she is ready for d/c home today. She will need a formal pulmonary function evaluation 4-6 weeks from this acute exacerbation to help define her pulmonary disease. - Albuterol and ipratropium nebulized q6 PRN  - Oral levofloxacin 750 mg daily  - Oral prednisone 40 mg daily, tapering over 10 days - Continue Mucinex 600 mg twice a day  - Benadryl 25mg  po q8h - Tussinex 5mL q12h PRN cough  2. Acute exacerbation of chronic diastolic congestive heart failure: Dyspnea and orthopnea for 5 days. Pulmonary congestion on chest radiograph. Mildly elevated proBNP at 332.4. She is not on furosemide at home, but given Lasix this admission, and she is down 10lbs. Will continue Lasix 20mg  po daily at discharge, which may need to be  increased dependign on her BLE edema. - Lasix 20 po daily - Stopping home HCTZ - Straight I&O  - Daily weights   3. Chest pain: Resolved. Likely musculoskeletal in nature arising from cough-induced injury. Reproducible on exam. Troponins are all negative. EKG not concerning for acute ischemia.   4. History of infective endocarditis: Blood cultures in February grew Staphylococcus aureus and Group B Streptococcus . 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 She completed treatment with Ancef. Repeat blood cultures on February 6, eighth, ninth, and in March were negative. Repeat TEE on March 20 was negative for vegetations. She has remained afebrile with out leukocytosis.   5. History of upper extremity DVT: Thrombosis in the right  brachial vein diagnosed 03/10/2013 secondary to PICC line. Started on warfarin with an expected length of treatment of 3 months. End date will be 06/10/2013. INR subtherapeutic on admission, and she was bridged with Lovenox. Her INR is 1.92 today.  Will continue the Coumadin at 5mg  tomorrow, and then she is to follow up with Dr. Alexandria Lodge on Thursday, 5/15 before taking her Thursday dose of Coumadin. - Warfarin per pharmacy dosing   6. Type II diabetes mellitus: At home, she takes insulin lispro protamine-insulin lispro 75-25 120 units in the morning and 65 units in the evening. Her last hemoglobin A1c was 9.5 in February. A microalbumin to creatinine ratio was normal in July of last year. We are, we will have her on insulin aspart protamine-insulin aspart 70-30 at her home dose. CBGs still elevated, likely 2/2 to the steroids. - Insulin aspart protamine-insulin aspart 70-30 120 units with breakfast and 65 units with dinner  - SSI resistant  7. Hypertension: On atenolol 100 mg daily and benazepril 40 mg daily. She has received IV Lasix this admission and her home HCTZ was restarted. Per Ms. Redditt, she feels that the Lasix improves her BLE, and that the HCTZ is  not very effective. - Atenolol 100 mg daily  - Benazepril 40 mg daily  - Lasix 20mg  po daily - Stopping HCTZ  8. Hyperlipidemia: Last lipid panel was July 2013: cholesterol 189, triglycerides 247, HDL 31, LDL 109. Ten-year CVD risk is greater than 65%. Unsure if she is on pravastatin 40 or simvastatin 20 once a day.  - Atorvastatin 80 mg daily while hospitalized, consider discharging her on this medicine  - Continue aspirin 81 mg daily   9. Depression and chronic pain: At home she takes bupropion 150 mg twice a day and pregabalin 200 mg 3 times a day. Continue home regimen  - Continue bupropion 150 mg twice a day  - Continue pregabalin 200 mg 3 times a day   10. Prophylaxis: Therapeutic warfarin    Dispo: Disposition is deferred at this time, awaiting improvement of current medical problems.  Anticipated discharge in approximately 1-2 day(s).   The patient does have a current PCP Blanch Media, MD), therefore will be requiring OPC follow-up after discharge.   The patient does not have transportation limitations that hinder transportation to clinic appointments.  .Services Needed at time of discharge: Y = Yes, Blank = No PT:   OT:   RN:   Equipment:   Other:     LOS: 3 days   Genelle Gather 05/03/2013, 8:59 AM

## 2013-05-03 NOTE — Progress Notes (Signed)
ANTICOAGULATION CONSULT NOTE - Follow Up Consult  Pharmacy Consult for coumadin/lovenox Indication: Recent hx of DVT of UE  Allergies  Allergen Reactions  . Iohexol      Desc: IV CONTRAST CAUSE NEPHROPATHY IN 2007   . Morphine Sulfate Itching and Rash   Weight = 128 kg  Labs:  Recent Labs  05/01/13 0450 05/01/13 0915 05/02/13 0455 05/03/13 0440  LABPROT  --  15.8* 17.4* 21.2*  INR  --  1.29 1.47 1.92*  CREATININE 0.90  --   --   --     Estimated Creatinine Clearance: 98 ml/min (by C-G formula based on Cr of 0.9).   Assessment: 61 YOF presented with 5 days of dyspnea. Patient also has recent hx of rght brachial vein thrombosis (diagnosed 03/10/2013) d/u PICC line. She was started on coumadin and expected to treat for 3 months (end date 06/10/2013).  INR was therapeutic on 04/04/13 outpt INR check up, but subtherapeutic (1.3) on admission.     INR remains  subtherapeutic = 1.92 but increasing after Couamdin7.5mg  last pm.  No bleeding noted.  She is also on  lovenox 130mg  q12 bridge to INR > 2. Scr 0.9.  PTA dose: 2.5 mg on MWF, and 5mg  on all other days.  Goal of Therapy:  INR 2-3 Monitor platelets by anticoagulation protocol: Yes   Plan:  - Continue Lovenox 130 mg SQ Q 12hrs - Coumadin 5 mg po x 1 - Daily Protime  Thank you. Okey Regal, PharmD [phone]  05/03/2013 1:04 PM

## 2013-05-05 ENCOUNTER — Ambulatory Visit (INDEPENDENT_AMBULATORY_CARE_PROVIDER_SITE_OTHER): Payer: Medicare Other | Admitting: Pharmacist

## 2013-05-05 DIAGNOSIS — I82629 Acute embolism and thrombosis of deep veins of unspecified upper extremity: Secondary | ICD-10-CM

## 2013-05-05 DIAGNOSIS — I82621 Acute embolism and thrombosis of deep veins of right upper extremity: Secondary | ICD-10-CM

## 2013-05-05 DIAGNOSIS — Z7901 Long term (current) use of anticoagulants: Secondary | ICD-10-CM

## 2013-05-05 LAB — POCT INR
INR: 1.8
INR: 1.8

## 2013-05-05 NOTE — Patient Instructions (Signed)
Patient instructed to take medications as defined in the Anti-coagulation Track section of this encounter.  Patient instructed to DISCONTINUE warfarin.  Patient verbalized understanding of these instructions.

## 2013-05-05 NOTE — Progress Notes (Signed)
Anti-Coagulation Progress Note  Jennifer [name] is a 52 y.o. female who is currently on an anti-coagulation regimen.    RECENT RESULTS: Recent results are below, the most recent result is correlated with a dose as instructed since discharge from hospital over weekend. Patient is to have skin grafting surgery next Friday she states. She was inquiring about necessity of interruption of warfarin in advance of her surgery. Have reviewed her EHR and documentation for her index UE VTE--provoked causation due to PICC Line. We are nearing end of duration for this indication. Given her circumstances of requiring interruption for planned surgery next week (Friday of next week) a decision--with advice and consent of the patient, is made to permanently discontinue her warfarin today. This will hopefully prevent any potential for cancellation of surgery next week in the event that she would still be hypoprothrombinemic, etc. We will NOT recommence warfarin post-operatively--again owing to her being near completion of her initial course of therapy. Patient was told of this rationale and decision and she is in agreement with this mutual decision. Have discussed case with the Attending Physician for today, Dr. Josem Kaufmann.  Lab Results  Component Value Date   INR 1.80 05/05/2013   INR 1.8 05/05/2013   INR 1.92* 05/03/2013    ANTI-COAG DOSE: Anticoagulation Dose Instructions as of 05/05/2013     [name] Tue Wed Thu Fri Sat   New Dose 5 mg 2.5 mg 5 mg 2.5 mg 5 mg 2.5 mg 5 mg       ANTICOAG SUMMARY:   ANTICOAG TODAY: Anticoagulation Summary as of 05/05/2013   INR goal 2.0-3.0  Selected INR 1.80! (05/05/2013)  Next INR check   Target end date 06/14/2013   Indications  DVT of upper extremity (deep vein thrombosis) (Resolved) [453.82] Long term (current) use of anticoagulants (Resolved) [V58.61]      Anticoagulation Episode Summary   INR check location [facility]   Preferred lab    Discontinue date 05/05/2013    Discontinue reason Scheduled Surgery   Send INR reminders to    Comments       PATIENT INSTRUCTIONS: Patient Instructions  Patient instructed to take medications as defined in the Anti-coagulation Track section of this encounter.  Patient instructed to DISCONTINUE warfarin.  Patient verbalized understanding of these instructions.       FOLLOW-UP Return if symptoms worsen or fail to improve.  Hulen Luster, III Pharm.D., CACP

## 2013-05-09 NOTE — Progress Notes (Signed)
Indication: Provoked Upper Extremity DVT in brachial vein.  Duration: 3 months.  INR:  Below target.  I agree with Dr. Saralyn Pilar assessment and plan as documented.  She is scheduled to have surgery in 1 week.  The cause of her provoked brachial DVT was removed (PICC line).  She will be close to the planned end date for her upper extremity DVT.  In addition, the risk for thromboembolism is very low from the brachial vein.  We will therefore discontinue the coumadin with the informed consent of [name].  We will also not restart the clopidogrel given the patency of the LE stent after several years.

## 2013-05-10 ENCOUNTER — Encounter (HOSPITAL_COMMUNITY): Payer: Self-pay | Admitting: Pharmacy Technician

## 2013-05-10 ENCOUNTER — Encounter (HOSPITAL_COMMUNITY)
Admission: RE | Admit: 2013-05-10 | Discharge: 2013-05-10 | Disposition: A | Discharge status: Home / Self Care | Payer: Medicare Other | Source: Ambulatory Visit | Attending: Orthopedic Surgery | Admitting: Orthopedic Surgery

## 2013-05-10 ENCOUNTER — Encounter (HOSPITAL_COMMUNITY): Payer: Self-pay

## 2013-05-10 DIAGNOSIS — Z794 Long term (current) use of insulin: Secondary | ICD-10-CM | POA: Diagnosis not present

## 2013-05-10 DIAGNOSIS — IMO0001 Reserved for inherently not codable concepts without codable children: Secondary | ICD-10-CM | POA: Diagnosis not present

## 2013-05-10 DIAGNOSIS — E1149 Type 2 diabetes mellitus with other diabetic neurological complication: Secondary | ICD-10-CM | POA: Diagnosis not present

## 2013-05-10 DIAGNOSIS — J449 Chronic obstructive pulmonary disease, unspecified: Secondary | ICD-10-CM | POA: Diagnosis not present

## 2013-05-10 DIAGNOSIS — I872 Venous insufficiency (chronic) (peripheral): Secondary | ICD-10-CM | POA: Diagnosis not present

## 2013-05-10 DIAGNOSIS — K219 Gastro-esophageal reflux disease without esophagitis: Secondary | ICD-10-CM | POA: Diagnosis not present

## 2013-05-10 DIAGNOSIS — I509 Heart failure, unspecified: Secondary | ICD-10-CM | POA: Diagnosis not present

## 2013-05-10 DIAGNOSIS — E785 Hyperlipidemia, unspecified: Secondary | ICD-10-CM | POA: Diagnosis not present

## 2013-05-10 DIAGNOSIS — I10 Essential (primary) hypertension: Secondary | ICD-10-CM | POA: Diagnosis not present

## 2013-05-10 DIAGNOSIS — L97509 Non-pressure chronic ulcer of other part of unspecified foot with unspecified severity: Secondary | ICD-10-CM | POA: Diagnosis not present

## 2013-05-10 DIAGNOSIS — F17200 Nicotine dependence, unspecified, uncomplicated: Secondary | ICD-10-CM | POA: Diagnosis not present

## 2013-05-10 LAB — COMPREHENSIVE METABOLIC PANEL
ALT: 78 U/L — ABNORMAL HIGH (ref 0–35)
AST: 51 U/L — ABNORMAL HIGH (ref 0–37)
Albumin: 3.4 g/dL — ABNORMAL LOW (ref 3.5–5.2)
Alkaline Phosphatase: 88 U/L (ref 39–117)
BUN: 15 mg/dL (ref 6–23)
CO2: 26 mEq/L (ref 19–32)
Calcium: 9.5 mg/dL (ref 8.4–10.5)
Chloride: 104 mEq/L (ref 96–112)
Creatinine, Ser: 0.83 mg/dL (ref 0.50–1.10)
GFR calc Af Amer: >90 mL/min (ref >90)
GFR calc non Af Amer: 79 mL/min — ABNORMAL LOW (ref >90)
Glucose, Bld: 158 mg/dL — ABNORMAL HIGH (ref 70–99)
Potassium: 4.4 mEq/L (ref 3.5–5.1)
Sodium: 141 mEq/L (ref 135–145)
Total Bilirubin: 0.1 mg/dL — ABNORMAL LOW (ref 0.3–1.2)
Total Protein: 7.2 g/dL (ref 6.0–8.3)

## 2013-05-10 LAB — PROTIME-INR
INR: 1.06 (ref 0.00–1.49)
Prothrombin Time: 13.7 seconds (ref 11.6–15.2)

## 2013-05-10 LAB — CBC
HCT: 40.1 % (ref 36.0–46.0)
Hemoglobin: 12.9 g/dL (ref 12.0–15.0)
MCH: 29.7 pg (ref 26.0–34.0)
MCHC: 32.2 g/dL (ref 30.0–36.0)
MCV: 92.2 fL (ref 78.0–100.0)
Platelets: 201 10*3/uL (ref 150–400)
RBC: 4.35 MIL/uL (ref 3.87–5.11)
RDW: 14.9 % (ref 11.5–15.5)
WBC: 12.3 10*3/uL — ABNORMAL HIGH (ref 4.0–10.5)

## 2013-05-10 LAB — APTT: aPTT: 33 seconds (ref 24–37)

## 2013-05-10 LAB — SURGICAL PCR SCREEN
MRSA, PCR: NEGATIVE
Staphylococcus aureus: NEGATIVE

## 2013-05-10 NOTE — Pre-Procedure Instructions (Signed)
ZAINAB CRUMRINE  05/10/2013   Your procedure is scheduled on:  05-13-2013  Friday   Report to [facility] at 10:20 AM. Take Charlotta Newton to he 3rd floor  Call this number if you have problems the morning of surgery: [phone]   Remember:   Do not eat food or drink liquids after midnight.    Take these medicines the morning of surgery with A SIP OF WATER: inhalers as instructed by physican,atenolol(tenormin),baclofen,wellbutrin(Bupropion)nexium,levofloxacin(levsquin)pravastatin lyrica,   Do not wear jewelry, make-up or nail polish.  Do not wear lotions, powders, or perfumes.   Do not shave 48 hours prior to surgery. Men may shave face and neck.  Do not bring valuables to the hospital.  Contacts, dentures or bridgework may not be worn into surgery.  Leave suitcase in the car. After surgery it may be brought to your room.   For patients admitted to the hospital, checkout time is 11:00 AM the day of discharge.   Patients discharged the day of surgery will not be allowed to drive home.    Special Instructions: Shower using CHG 2 nights before surgery and the night before surgery.  If you shower the day of surgery use CHG.  Use special wash - you have one bottle of CHG for all showers.  You should use approximately 1/3 of the bottle for each shower.   Please read over the following fact sheets that you were given: Pain Booklet, Coughing and Deep Breathing and Surgical Site Infection Prevention

## 2013-05-11 ENCOUNTER — Ambulatory Visit (INDEPENDENT_AMBULATORY_CARE_PROVIDER_SITE_OTHER): Payer: Medicare Other | Admitting: Internal Medicine

## 2013-05-11 ENCOUNTER — Encounter: Payer: Self-pay | Admitting: Internal Medicine

## 2013-05-11 VITALS — BP 154/87 | HR 67 | Temp 97.6°F | Ht 67.0 in | Wt 283.3 lb

## 2013-05-11 DIAGNOSIS — F17200 Nicotine dependence, unspecified, uncomplicated: Secondary | ICD-10-CM

## 2013-05-11 DIAGNOSIS — E1159 Type 2 diabetes mellitus with other circulatory complications: Secondary | ICD-10-CM

## 2013-05-11 DIAGNOSIS — I798 Other disorders of arteries, arterioles and capillaries in diseases classified elsewhere: Secondary | ICD-10-CM

## 2013-05-11 DIAGNOSIS — E785 Hyperlipidemia, unspecified: Secondary | ICD-10-CM

## 2013-05-11 DIAGNOSIS — J449 Chronic obstructive pulmonary disease, unspecified: Secondary | ICD-10-CM

## 2013-05-11 DIAGNOSIS — Z720 Tobacco use: Secondary | ICD-10-CM

## 2013-05-11 DIAGNOSIS — R7989 Other specified abnormal findings of blood chemistry: Secondary | ICD-10-CM

## 2013-05-11 LAB — GLUCOSE, CAPILLARY: Glucose-Capillary: 88 mg/dL (ref 70–99)

## 2013-05-11 LAB — POCT GLYCOSYLATED HEMOGLOBIN (HGB A1C): Hemoglobin A1C: 8

## 2013-05-11 MED ORDER — INSULIN LISPRO PROT & LISPRO (75-25 MIX) 100 UNIT/ML KWIKPEN: 75.0000 [IU] | PEN_INJECTOR | Freq: Two times a day (BID) | SUBCUTANEOUS | Status: DC

## 2013-05-11 MED ORDER — FUROSEMIDE 40 MG PO TABS: 40.0000 mg | ORAL_TABLET | Freq: Every day | ORAL | Status: DC

## 2013-05-11 NOTE — Assessment & Plan Note (Addendum)
Hemoglobin A1c today is 8. Patient is currently taking metformin 1000 twice a day and Humalog 120 in the morning and 60 5 in the evening but she would sometimes increases her dosage to 180units in the morning and 120 units in the evening. CBG reading is noticeable for hypoglycemia in the 50s. I advised the patient that she can not change her insulin on a daily basis.Her daily Insulin need based on her weight is 152 units so she's anyway using more but since patient has been on prednisone and has foot infection she is currently needing more. At this point I will increase her insulin to 130 units in the morning and 75 units in the evening.  Will reevaluate patient in 2 weeks. I underlined that she needs to continue to check her blood sugars on a daily basis and bring her meter with her. Patient is scheduled for ophthalmology in June

## 2013-05-11 NOTE — Assessment & Plan Note (Signed)
Patient counseled smoking cessation. Not ready to quit

## 2013-05-11 NOTE — Assessment & Plan Note (Signed)
Elevated LFTs were noted on 05/10/13. Most likely due to taking pravastatin and simvastatin. Recheck liver function in 2-4 weeks. If persistent consider further evaluation and management. Hepatitis panel in 2009 and 2010 were negative.

## 2013-05-11 NOTE — Patient Instructions (Addendum)
1. Stop taking Simvastatin  2. Increase your Insulin in the morning to 130 units and in the evening to 75 units. Do not adjust your insulin. It will cause to have low blood sugars which is dangerous. If you are noticing high sugars please call the clinic for further instruction. 3. Increase Lasix from 20 mg to 40 mg daily 4. Stop taking prednisone

## 2013-05-11 NOTE — Assessment & Plan Note (Signed)
Patient was taking simvastatin and pravastatin. I instructed to stop taking simvastatin.

## 2013-05-11 NOTE — Assessment & Plan Note (Signed)
Patient is currently on Atrovent, albuterol nebulizer when necessary along with Advair. Patient has not been taking Dulera. Patient was started on a prednisone taper. I will discontinue today.

## 2013-05-11 NOTE — Assessment & Plan Note (Signed)
Continues to have leg swelling. I will increase Lasix from 20 mg daily to 40 mg daily. Need repeat basic metabolic panel in 2 weeks.

## 2013-05-11 NOTE — Progress Notes (Signed)
Subjective:   [name] female   DOB: [birth year] 52 y.o.   MRN: [id number]  HPI: [name] is a 52 y.o. female with past medical history significant as outlined below who presented to the clinic for a hospital followup.patient was admitted on 04/30/13 for asthma exacerbation. 1. Diabetes :  CBG: Average 272  Before breakfast 190, 352, 555 After  lunch: 204-273 Before bedtime: 131-500 with one low of 51  Metformin 1000 mg bid Humalog 120 in am and sometimes 65 units pm  Sometimes she uses 180 in am and 120 units at pm   2. Patient was started on Lasix during Hospital admission .  She is currently taking it. Stopped taking HCZT. Patient noted her leg swelling has somewhat improved continues to be swollen.  3. Statin: was likely taking Pravastatin and Simvastatin together   4. Breathing: It is ok. Used the albuterol nebulizer twice since discharge from hospital. Completed antibiotic therapy.Currently taking prednisone 20 mg daily  Continue to smoke: 3 cigarettes to 1 1/2 ppd      Past Medical History  Diagnosis Date  . Depression   . GERD (gastroesophageal reflux disease)   . Hyperlipidemia   . Hypertension   . Glaucoma   . PVD (peripheral vascular disease) with claudication     Stents to bilateral common iliac arteries (left 2005, right 2008), on chronic plavix  . Tinnitus   . Diabetic peripheral neuropathy   . Fibromyalgia   . Hyperplastic colon polyp 12/2010    Per colonoscopy (12/2010) - Dr. Arlyce Dice  . Diverticulosis   . Asthma   . COPD 01/08/2007    PFT's 05/2007 : FEV1/FVC 82, FEV1 64% pred, FEF 25-75% 40% predicted, 16% improvement in FEV1 with bronchodilators.     . Type II diabetes mellitus with peripheral circulatory disorders, uncontrolled (250.72) DX: 1993    Insulin dep. Poor control. Complicated by diabetic foot ulcer and diabetic eye disease.    . Chronic pain syndrome 12/03/2011    Likely secondary to depression, "fibromyalgia",  neuropathy, and obesity.    . Tobacco abuse   . Chronic osteomyelitis of foot     chronic, right secondary to diabetic foot ulcers  . Polymicrobial bacterial infection 01/2013    GBS and S. aureus bacteremia // Source likely infected diabetic foot ulcer  . 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 Chronic diastolic heart failure     grade 2 per 2D echocardiogram (01/2013)  . Ulcer of foot, chronic     Left. No OM per MRI (01/2013)  . DVT of upper extremity (deep vein thrombosis) 03/11/2013    Secondary to PICC line. Right brachial vein, diagnosed on 03/10/2013 Coumadin for 3 months. End date 06/10/2013   . Arthritis    Current Outpatient Prescriptions  Medication Sig Dispense Refill  . albuterol (PROAIR HFA) 108 (90 BASE) MCG/ACT inhaler Inhale 2 puffs into the lungs every 6 (six) hours as needed for wheezing.  8.5 each  5  . albuterol (PROVENTIL) (5 MG/ML) 0.5% nebulizer solution Take 2.5 mg by nebulization every 6 (six) hours as needed for wheezing or shortness of breath.      Marland Kitchen aspirin EC 81 MG tablet Take 81 mg by mouth every morning.      Marland Kitchen atenolol (TENORMIN) 100 MG tablet Take 100 mg by mouth every morning.      Marland Kitchen  baclofen (LIORESAL) 10 MG tablet Take 10 mg by mouth 3 (three) times daily.      . benazepril (LOTENSIN) 40 MG tablet Take 40 mg by mouth every morning.      Marland Kitchen buPROPion (WELLBUTRIN SR) 150 MG 12 hr tablet Take 1 tablet (150 mg total) by mouth 2 (two) times daily.  60 tablet  5  . chlorpheniramine-HYDROcodone (TUSSIONEX) 10-8 MG/5ML LQCR Take 5 mLs by mouth every 12 (twelve) hours as needed (cough).      . collagenase (SANTYL) ointment Apply 1 application topically daily.      . diphenhydrAMINE (BENADRYL) 25 MG tablet Take 25 mg by mouth every 8 (eight) hours.      Marland Kitchen esomeprazole (NEXIUM) 20 MG capsule Take 1 capsule (20 mg total) by mouth daily before breakfast.   30 capsule  5  . fluticasone (FLONASE) 50 MCG/ACT nasal spray Place 2 sprays into the nose 2 (two) times daily as needed for allergies.      . Fluticasone-Salmeterol (ADVAIR DISKUS) 250-50 MCG/DOSE AEPB Inhale 1 puff into the lungs 2 (two) times daily.  60 each  5  . furosemide (LASIX) 20 MG tablet Take 1 tablet (20 mg total) by mouth daily.  30 tablet  6  . glucose blood test strip Check blood sugar up to 4 times a day before meals and bedtime Dx code- 250.72, insulin requiring  150 each  12  . guaiFENesin (MUCINEX) 600 MG 12 hr tablet Take 1 tablet (600 mg total) by mouth 2 (two) times daily.  60 tablet  2  . hydrochlorothiazide (HYDRODIURIL) 25 MG tablet Take 25 mg by mouth daily.      . Insulin Lispro Prot & Lispro (HUMALOG MIX 75/25 KWIKPEN) (75-25) 100 UNIT/ML SUPN Inject 65-120 Units into the skin 2 (two) times daily. 120 units in the morning and 65 units in the evening      . ipratropium (ATROVENT) 0.02 % nebulizer solution Take 0.5 mg by nebulization every 6 (six) hours as needed for wheezing.      Marland Kitchen levofloxacin (LEVAQUIN) 750 MG tablet Take 750 mg by mouth daily. Start date 05/03/13; Duration unknown to pt at this time      . metFORMIN (GLUCOPHAGE) 1000 MG tablet Take 1,000 mg by mouth 2 (two) times daily.      . mometasone-formoterol (DULERA) 100-5 MCG/ACT AERO Inhale 2 puffs into the lungs 2 (two) times daily.      Marland Kitchen oxyCODONE-acetaminophen (PERCOCET) 7.5-325 MG per tablet Take 1 tablet by mouth every 6 (six) hours as needed for pain.       . pravastatin (PRAVACHOL) 40 MG tablet Take 40 mg by mouth every morning.      . predniSONE (DELTASONE) 20 MG tablet Take 1 tablet (20 mg total) by mouth daily with breakfast.  8 tablet  0  . pregabalin (LYRICA) 200 MG capsule Take 1 capsule (200 mg total) by mouth 3 (three) times daily.  90 capsule  3  . silver sulfADIAZINE (SILVADENE) 1 % cream Apply 1 application topically daily. Apply to feet after soaking      . simvastatin (ZOCOR) 20 MG tablet  Take 1 tablet (20 mg total) by mouth daily at 6 PM.  30 tablet  5  . warfarin (COUMADIN) 5 MG tablet Take 2.5-5 mg by mouth daily. Alternates between 2.5 mg and 5 mg daily       No current facility-administered medications for this visit.   Facility-Administered Medications Ordered in Other Visits  Medication Dose Route Frequency Provider Last Rate Last Dose  . mupirocin ointment (BACTROBAN) 2 %   Nasal Once Josepha Pigg, MD       Family History  Problem Relation Age of Onset  . Diverticulosis Mother   . Diabetes Mother   . Hypertension Mother   . Congestive Heart Failure Mother   . Asthma Father    History   Social History  . Marital Status: Single    Spouse Name: N/A    Number of Children: 2  . Years of Education: college   Occupational History  . Disability     previously worked as a Psychologist, counselling History Main Topics  . Smoking status: Current Every Day Smoker -- 1.50 packs/day for 40 years    Types: Cigarettes  . Smokeless tobacco: None     Comment: Half pack or less. Smokes anpack per week.  . Alcohol Use: No  . Drug Use: Yes    Special: Marijuana, "Crack" cocaine     Comment: none in many years  . Sexually Active: None     Comment: states she is currently trying to quit smoking   Other Topics Concern  . None   Social History Narrative   On disability. Lives with son in [place]. 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    Boyfriend passed away stage 4 cancer 28-Mar-2013.   Review of Systems: Constitutional: Denies fever, chills, diaphoresis, appetite change and fatigue.  Respiratory: Denies SOB,cough, chest tightness,  But occasionally  wheezing.   Cardiovascular: Denies chest pain, palpitations but noted leg swelling.  Gastrointestinal: Denies nausea, vomiting, abdominal pain, diarrhea, constipation, blood in stool and abdominal distention.  Genitourinary: Denies dysuria, urgency, frequency, hematuria, flank pain and difficulty urinating.    Objective:   Physical Exam: Filed Vitals:   05/11/13 1041  BP: 156/80  Pulse: 64  Temp: 97.6 F (36.4 C)  TempSrc: Oral  Height: 5\' 7"  (1.702 m)  Weight: 283 lb 4.8 oz (128.504 kg)  SpO2: 97%   Constitutional: Vital signs reviewed.  Patient is obese female  in no acute distress and cooperative with exam. Alert and oriented x3.   Neck: Supple,  Cardiovascular: RRR, S1 normal, S2 normal, no MRG,1+ -2+ pitting edema lower extremity bilaterally  Pulmonary/Chest: CTAB, no wheezes, rales, or rhonchi Abdominal: Soft. Non-tender, non-distended, bowel sounds are normal,  Neurological: A&O x3,  Skin: lower extremities; chronic venous stasis dermatitis noted. Both feet are placed in boots

## 2013-05-12 NOTE — Progress Notes (Signed)
Case discussed with Dr.Illath soon after the resident saw the patient.  We reviewed the resident's history and exam and pertinent patient test results.  I agree with the assessment, diagnosis and plan of care documented in the resident's note. 

## 2013-05-12 NOTE — Discharge Summary (Signed)
I saw Jennifer [name] on day of discharge and assisted in the discharge plan.

## 2013-05-13 ENCOUNTER — Ambulatory Visit (HOSPITAL_COMMUNITY)
Admission: RE | Admit: 2013-05-13 | Discharge: 2013-05-14 | Disposition: A | Discharge status: Home / Self Care | Payer: Medicare Other | Source: Ambulatory Visit | Attending: Orthopedic Surgery | Admitting: Orthopedic Surgery

## 2013-05-13 ENCOUNTER — Encounter (HOSPITAL_COMMUNITY): Payer: Self-pay | Admitting: Anesthesiology

## 2013-05-13 ENCOUNTER — Encounter (HOSPITAL_COMMUNITY): Payer: Self-pay | Admitting: Certified Registered"

## 2013-05-13 ENCOUNTER — Ambulatory Visit (HOSPITAL_COMMUNITY): Payer: Medicare Other | Admitting: Anesthesiology

## 2013-05-13 ENCOUNTER — Encounter (HOSPITAL_COMMUNITY)
Admission: RE | Disposition: A | Discharge status: Home / Self Care | Payer: Self-pay | Source: Ambulatory Visit | Attending: Orthopedic Surgery

## 2013-05-13 DIAGNOSIS — I509 Heart failure, unspecified: Secondary | ICD-10-CM | POA: Insufficient documentation

## 2013-05-13 DIAGNOSIS — IMO0001 Reserved for inherently not codable concepts without codable children: Secondary | ICD-10-CM | POA: Insufficient documentation

## 2013-05-13 DIAGNOSIS — K219 Gastro-esophageal reflux disease without esophagitis: Secondary | ICD-10-CM | POA: Insufficient documentation

## 2013-05-13 DIAGNOSIS — J4489 Other specified chronic obstructive pulmonary disease: Secondary | ICD-10-CM | POA: Insufficient documentation

## 2013-05-13 DIAGNOSIS — J449 Chronic obstructive pulmonary disease, unspecified: Secondary | ICD-10-CM | POA: Insufficient documentation

## 2013-05-13 DIAGNOSIS — E1149 Type 2 diabetes mellitus with other diabetic neurological complication: Secondary | ICD-10-CM | POA: Insufficient documentation

## 2013-05-13 DIAGNOSIS — E785 Hyperlipidemia, unspecified: Secondary | ICD-10-CM | POA: Insufficient documentation

## 2013-05-13 DIAGNOSIS — E11621 Type 2 diabetes mellitus with foot ulcer: Secondary | ICD-10-CM

## 2013-05-13 DIAGNOSIS — Z794 Long term (current) use of insulin: Secondary | ICD-10-CM | POA: Insufficient documentation

## 2013-05-13 DIAGNOSIS — I872 Venous insufficiency (chronic) (peripheral): Secondary | ICD-10-CM | POA: Insufficient documentation

## 2013-05-13 DIAGNOSIS — L97509 Non-pressure chronic ulcer of other part of unspecified foot with unspecified severity: Secondary | ICD-10-CM | POA: Diagnosis not present

## 2013-05-13 DIAGNOSIS — I10 Essential (primary) hypertension: Secondary | ICD-10-CM | POA: Insufficient documentation

## 2013-05-13 DIAGNOSIS — F17200 Nicotine dependence, unspecified, uncomplicated: Secondary | ICD-10-CM | POA: Insufficient documentation

## 2013-05-13 HISTORY — PX: SKIN SPLIT GRAFT: SHX444

## 2013-05-13 LAB — GLUCOSE, CAPILLARY
Glucose-Capillary: 240 mg/dL — ABNORMAL HIGH (ref 70–99)
Glucose-Capillary: 197 mg/dL — ABNORMAL HIGH (ref 70–99)
Glucose-Capillary: 240 mg/dL — ABNORMAL HIGH (ref 70–99)
Glucose-Capillary: 228 mg/dL — ABNORMAL HIGH (ref 70–99)

## 2013-05-13 SURGERY — APPLICATION, GRAFT, SKIN, SPLIT-THICKNESS
Anesthesia: General | Site: Foot | Laterality: Bilateral | Wound class: Dirty or Infected

## 2013-05-13 MED ORDER — METOCLOPRAMIDE HCL 10 MG PO TABS
5.0000 mg | ORAL_TABLET | Freq: Three times a day (TID) | ORAL | Status: DC | PRN
  Administered 2013-05-14: 10 mg via ORAL
  Filled 2013-05-13: qty 1

## 2013-05-13 MED ORDER — OXYCODONE HCL 5 MG PO TABS
ORAL_TABLET | ORAL | Status: AC
  Filled 2013-05-13: qty 1

## 2013-05-13 MED ORDER — PREGABALIN 50 MG PO CAPS
200.0000 mg | ORAL_CAPSULE | Freq: Three times a day (TID) | ORAL | Status: DC
  Administered 2013-05-13 – 2013-05-14 (×3): 200 mg via ORAL
  Filled 2013-05-13 (×3): qty 4

## 2013-05-13 MED ORDER — IPRATROPIUM BROMIDE 0.02 % IN SOLN: 0.5000 mg | Freq: Four times a day (QID) | RESPIRATORY_TRACT | Status: DC | PRN

## 2013-05-13 MED ORDER — ONDANSETRON HCL 4 MG/2ML IJ SOLN: 4.0000 mg | Freq: Four times a day (QID) | INTRAMUSCULAR | Status: DC | PRN

## 2013-05-13 MED ORDER — ALBUTEROL SULFATE HFA 108 (90 BASE) MCG/ACT IN AERS: 2.0000 | INHALATION_SPRAY | Freq: Four times a day (QID) | RESPIRATORY_TRACT | Status: DC | PRN

## 2013-05-13 MED ORDER — BACLOFEN 10 MG PO TABS
10.0000 mg | ORAL_TABLET | Freq: Three times a day (TID) | ORAL | Status: DC
  Administered 2013-05-13 – 2013-05-14 (×3): 10 mg via ORAL
  Filled 2013-05-13 (×5): qty 1

## 2013-05-13 MED ORDER — FENTANYL CITRATE 0.05 MG/ML IJ SOLN
INTRAMUSCULAR | Status: DC | PRN
  Administered 2013-05-13: 100 ug via INTRAVENOUS

## 2013-05-13 MED ORDER — OXYCODONE HCL 5 MG PO TABS
5.0000 mg | ORAL_TABLET | Freq: Once | ORAL | Status: AC | PRN
  Administered 2013-05-13: 5 mg via ORAL

## 2013-05-13 MED ORDER — LACTATED RINGERS IV SOLN
INTRAVENOUS | Status: DC | PRN
  Administered 2013-05-13: 12:00:00 via INTRAVENOUS

## 2013-05-13 MED ORDER — INSULIN ASPART 100 UNIT/ML ~~LOC~~ SOLN
6.0000 [IU] | Freq: Three times a day (TID) | SUBCUTANEOUS | Status: DC
  Administered 2013-05-13 – 2013-05-14 (×3): 6 [IU] via SUBCUTANEOUS

## 2013-05-13 MED ORDER — WARFARIN SODIUM 2.5 MG PO TABS: 2.5000 mg | ORAL_TABLET | Freq: Every day | ORAL | Status: DC

## 2013-05-13 MED ORDER — ALBUTEROL SULFATE (5 MG/ML) 0.5% IN NEBU: 2.5000 mg | INHALATION_SOLUTION | Freq: Four times a day (QID) | RESPIRATORY_TRACT | Status: DC | PRN

## 2013-05-13 MED ORDER — HYDROMORPHONE HCL PF 1 MG/ML IJ SOLN: 0.5000 mg | INTRAMUSCULAR | Status: DC | PRN

## 2013-05-13 MED ORDER — SODIUM CHLORIDE 0.9 % IR SOLN
Status: DC | PRN
  Administered 2013-05-13: 1000 mL

## 2013-05-13 MED ORDER — MIDAZOLAM HCL 5 MG/5ML IJ SOLN
INTRAMUSCULAR | Status: DC | PRN
  Administered 2013-05-13: 2 mg via INTRAVENOUS

## 2013-05-13 MED ORDER — OXYCODONE HCL 5 MG/5ML PO SOLN: 5.0000 mg | Freq: Once | ORAL | Status: AC | PRN

## 2013-05-13 MED ORDER — BENAZEPRIL HCL 40 MG PO TABS
40.0000 mg | ORAL_TABLET | Freq: Every morning | ORAL | Status: DC
Start: 2013-05-13 — End: 2013-05-14
  Administered 2013-05-13 – 2013-05-14 (×2): 40 mg via ORAL
  Filled 2013-05-13 (×2): qty 1

## 2013-05-13 MED ORDER — METOCLOPRAMIDE HCL 5 MG/ML IJ SOLN: 5.0000 mg | Freq: Three times a day (TID) | INTRAMUSCULAR | Status: DC | PRN

## 2013-05-13 MED ORDER — DIPHENHYDRAMINE HCL 50 MG/ML IJ SOLN
25.0000 mg | Freq: Once | INTRAMUSCULAR | Status: AC
  Administered 2013-05-13: 25 mg via INTRAVENOUS

## 2013-05-13 MED ORDER — ONDANSETRON HCL 4 MG/2ML IJ SOLN
INTRAMUSCULAR | Status: DC | PRN
  Administered 2013-05-13: 4 mg via INTRAVENOUS

## 2013-05-13 MED ORDER — NICOTINE 21 MG/24HR TD PT24
21.0000 mg | MEDICATED_PATCH | Freq: Every day | TRANSDERMAL | Status: DC
  Administered 2013-05-13 – 2013-05-14 (×2): 21 mg via TRANSDERMAL
  Filled 2013-05-13 (×2): qty 1

## 2013-05-13 MED ORDER — LIDOCAINE HCL (CARDIAC) 20 MG/ML IV SOLN
INTRAVENOUS | Status: DC | PRN
  Administered 2013-05-13: 100 mg via INTRAVENOUS

## 2013-05-13 MED ORDER — PANTOPRAZOLE SODIUM 40 MG PO TBEC
40.0000 mg | DELAYED_RELEASE_TABLET | Freq: Every day | ORAL | Status: DC
  Administered 2013-05-14: 40 mg via ORAL
  Filled 2013-05-13: qty 1

## 2013-05-13 MED ORDER — PROMETHAZINE HCL 25 MG/ML IJ SOLN: 6.2500 mg | INTRAMUSCULAR | Status: DC | PRN

## 2013-05-13 MED ORDER — ATENOLOL 100 MG PO TABS
100.0000 mg | ORAL_TABLET | Freq: Every morning | ORAL | Status: DC
  Administered 2013-05-14: 100 mg via ORAL
  Filled 2013-05-13: qty 1

## 2013-05-13 MED ORDER — METFORMIN HCL 500 MG PO TABS
1000.0000 mg | ORAL_TABLET | Freq: Two times a day (BID) | ORAL | Status: DC
  Administered 2013-05-13 – 2013-05-14 (×2): 1000 mg via ORAL
  Filled 2013-05-13 (×4): qty 2

## 2013-05-13 MED ORDER — DEXTROSE 5 % IV SOLN
3.0000 g | Freq: Once | INTRAVENOUS | Status: DC
  Filled 2013-05-13: qty 3000

## 2013-05-13 MED ORDER — SODIUM CHLORIDE 0.9 % IV SOLN: INTRAVENOUS | Status: DC

## 2013-05-13 MED ORDER — DEXTROSE 5 % IV SOLN
3.0000 g | INTRAVENOUS | Status: DC | PRN
  Administered 2013-05-13: 3 g via INTRAVENOUS

## 2013-05-13 MED ORDER — OXYCODONE-ACETAMINOPHEN 5-325 MG PO TABS
1.0000 | ORAL_TABLET | ORAL | Status: DC | PRN
  Administered 2013-05-13 – 2013-05-14 (×5): 2 via ORAL
  Filled 2013-05-13 (×5): qty 2

## 2013-05-13 MED ORDER — PATIENT'S GUIDE TO USING COUMADIN BOOK
Freq: Once | Status: DC
  Filled 2013-05-13: qty 1

## 2013-05-13 MED ORDER — ONDANSETRON HCL 4 MG PO TABS
4.0000 mg | ORAL_TABLET | Freq: Four times a day (QID) | ORAL | Status: DC | PRN
  Administered 2013-05-14: 4 mg via ORAL
  Filled 2013-05-13: qty 1

## 2013-05-13 MED ORDER — WARFARIN SODIUM 7.5 MG PO TABS
7.5000 mg | ORAL_TABLET | Freq: Once | ORAL | Status: AC
  Administered 2013-05-13: 7.5 mg via ORAL
  Filled 2013-05-13: qty 1

## 2013-05-13 MED ORDER — INSULIN ASPART 100 UNIT/ML ~~LOC~~ SOLN
0.0000 [IU] | Freq: Three times a day (TID) | SUBCUTANEOUS | Status: DC
  Administered 2013-05-13 – 2013-05-14 (×2): 7 [IU] via SUBCUTANEOUS
  Administered 2013-05-14: 15 [IU] via SUBCUTANEOUS

## 2013-05-13 MED ORDER — ASPIRIN EC 81 MG PO TBEC
81.0000 mg | DELAYED_RELEASE_TABLET | Freq: Every morning | ORAL | Status: DC
Start: 2013-05-13 — End: 2013-05-14
  Administered 2013-05-13 – 2013-05-14 (×2): 81 mg via ORAL
  Filled 2013-05-13 (×2): qty 1

## 2013-05-13 MED ORDER — HYDROMORPHONE HCL PF 1 MG/ML IJ SOLN: 0.2500 mg | INTRAMUSCULAR | Status: DC | PRN

## 2013-05-13 MED ORDER — CEFAZOLIN SODIUM-DEXTROSE 2-3 GM-% IV SOLR
2.0000 g | Freq: Four times a day (QID) | INTRAVENOUS | Status: AC
  Administered 2013-05-13 – 2013-05-14 (×3): 2 g via INTRAVENOUS
  Filled 2013-05-13 (×3): qty 50

## 2013-05-13 MED ORDER — MOMETASONE FURO-FORMOTEROL FUM 100-5 MCG/ACT IN AERO
2.0000 | INHALATION_SPRAY | Freq: Two times a day (BID) | RESPIRATORY_TRACT | Status: DC
  Administered 2013-05-13 – 2013-05-14 (×2): 2 via RESPIRATORY_TRACT
  Filled 2013-05-13: qty 8.8

## 2013-05-13 MED ORDER — BUPROPION HCL ER (SR) 150 MG PO TB12
150.0000 mg | ORAL_TABLET | Freq: Two times a day (BID) | ORAL | Status: DC
  Administered 2013-05-13 – 2013-05-14 (×2): 150 mg via ORAL
  Filled 2013-05-13 (×3): qty 1

## 2013-05-13 MED ORDER — SIMVASTATIN 5 MG PO TABS
5.0000 mg | ORAL_TABLET | Freq: Every day | ORAL | Status: DC
  Administered 2013-05-13: 5 mg via ORAL
  Filled 2013-05-13 (×2): qty 1

## 2013-05-13 MED ORDER — PROPOFOL 10 MG/ML IV BOLUS
INTRAVENOUS | Status: DC | PRN
  Administered 2013-05-13: 200 mg via INTRAVENOUS

## 2013-05-13 MED ORDER — FLUTICASONE PROPIONATE 50 MCG/ACT NA SUSP: 2.0000 | Freq: Two times a day (BID) | NASAL | Status: DC | PRN

## 2013-05-13 MED ORDER — FUROSEMIDE 40 MG PO TABS
40.0000 mg | ORAL_TABLET | Freq: Every day | ORAL | Status: DC
  Administered 2013-05-13 – 2013-05-14 (×2): 40 mg via ORAL
  Filled 2013-05-13 (×2): qty 1

## 2013-05-13 MED ORDER — WARFARIN - PHARMACIST DOSING INPATIENT: Freq: Every day | Status: DC

## 2013-05-13 MED ORDER — HYDROCODONE-ACETAMINOPHEN 7.5-325 MG PO TABS: 1.0000 | ORAL_TABLET | ORAL | Status: DC | PRN

## 2013-05-13 SURGICAL SUPPLY — 52 items
BLADE SURG 10 STRL SS (BLADE) ×1 IMPLANT
BNDG COHESIVE 4X5 TAN STRL (GAUZE/BANDAGES/DRESSINGS) ×1 IMPLANT
BNDG COHESIVE 6X5 TAN STRL LF (GAUZE/BANDAGES/DRESSINGS) ×4 IMPLANT
BNDG GAUZE STRTCH 6 (GAUZE/BANDAGES/DRESSINGS) ×5 IMPLANT
CLOTH BEACON ORANGE TIMEOUT ST (SAFETY) ×2 IMPLANT
COTTON STERILE ROLL (GAUZE/BANDAGES/DRESSINGS) ×1 IMPLANT
COVER SURGICAL LIGHT HANDLE (MISCELLANEOUS) ×2 IMPLANT
CUFF TOURNIQUET SINGLE 18IN (TOURNIQUET CUFF) ×1 IMPLANT
CUFF TOURNIQUET SINGLE 24IN (TOURNIQUET CUFF) IMPLANT
CUFF TOURNIQUET SINGLE 34IN LL (TOURNIQUET CUFF) IMPLANT
CUFF TOURNIQUET SINGLE 44IN (TOURNIQUET CUFF) IMPLANT
DRAPE U-SHAPE 47X51 STRL (DRAPES) ×3 IMPLANT
DRSG ADAPTIC 3X8 NADH LF (GAUZE/BANDAGES/DRESSINGS) ×3 IMPLANT
DRSG PAD ABDOMINAL 8X10 ST (GAUZE/BANDAGES/DRESSINGS) ×4 IMPLANT
DURAPREP 26ML APPLICATOR (WOUND CARE) ×3 IMPLANT
ELECT CAUTERY BLADE 6.4 (BLADE) IMPLANT
ELECT REM PT RETURN 9FT ADLT (ELECTROSURGICAL) ×2
ELECTRODE REM PT RTRN 9FT ADLT (ELECTROSURGICAL) IMPLANT
GLOVE BIO SURGEON STRL SZ8.5 (GLOVE) ×1 IMPLANT
GLOVE BIOGEL PI IND STRL 6.5 (GLOVE) IMPLANT
GLOVE BIOGEL PI IND STRL 9 (GLOVE) ×1 IMPLANT
GLOVE BIOGEL PI INDICATOR 6.5 (GLOVE) ×1
GLOVE BIOGEL PI INDICATOR 9 (GLOVE) ×1
GLOVE SURG ORTHO 9.0 STRL STRW (GLOVE) ×2 IMPLANT
GLOVE SURG SS PI 8.5 STRL IVOR (GLOVE) ×1
GLOVE SURG SS PI 8.5 STRL STRW (GLOVE) IMPLANT
GOWN PREVENTION PLUS XLARGE (GOWN DISPOSABLE) ×2 IMPLANT
GOWN SRG XL XLNG 56XLVL 4 (GOWN DISPOSABLE) ×1 IMPLANT
GOWN STRL NON-REIN XL XLG LVL4 (GOWN DISPOSABLE) ×2
GRAFT TISS THERASKIN 2X3 (Tissue) IMPLANT
HANDPIECE INTERPULSE COAX TIP (DISPOSABLE)
KIT BASIN OR (CUSTOM PROCEDURE TRAY) ×2 IMPLANT
KIT ROOM TURNOVER OR (KITS) ×2 IMPLANT
MANIFOLD NEPTUNE II (INSTRUMENTS) ×1 IMPLANT
NS IRRIG 1000ML POUR BTL (IV SOLUTION) ×2 IMPLANT
PACK ORTHO EXTREMITY (CUSTOM PROCEDURE TRAY) ×2 IMPLANT
PAD ARMBOARD 7.5X6 YLW CONV (MISCELLANEOUS) ×3 IMPLANT
PADDING CAST COTTON 6X4 STRL (CAST SUPPLIES) ×1 IMPLANT
SET HNDPC FAN SPRY TIP SCT (DISPOSABLE) IMPLANT
SLEEVE STOCKINETTE LIMB 6X9 (MISCELLANEOUS) ×1 IMPLANT
SPONGE GAUZE 4X4 12PLY (GAUZE/BANDAGES/DRESSINGS) ×3 IMPLANT
SPONGE LAP 18X18 X RAY DECT (DISPOSABLE) ×2 IMPLANT
STAPLER VISISTAT 35W (STAPLE) ×1 IMPLANT
STOCKINETTE IMPERVIOUS 9X36 MD (GAUZE/BANDAGES/DRESSINGS) ×1 IMPLANT
TISSUE THERASKIN 2X3 (Tissue) ×6 IMPLANT
TOWEL OR 17X24 6PK STRL BLUE (TOWEL DISPOSABLE) ×2 IMPLANT
TOWEL OR 17X26 10 PK STRL BLUE (TOWEL DISPOSABLE) ×2 IMPLANT
TUBE ANAEROBIC SPECIMEN COL (MISCELLANEOUS) IMPLANT
TUBE CONNECTING 12X1/4 (SUCTIONS) ×2 IMPLANT
UNDERPAD 30X30 INCONTINENT (UNDERPADS AND DIAPERS) ×1 IMPLANT
WATER STERILE IRR 1000ML POUR (IV SOLUTION) ×1 IMPLANT
YANKAUER SUCT BULB TIP NO VENT (SUCTIONS) ×2 IMPLANT

## 2013-05-13 NOTE — Progress Notes (Signed)
Orthopedic Tech Progress Note Patient Details:  Jennifer [name] [birth year] [id number]  Ortho Devices Type of Ortho Device: Postop shoe/boot Ortho Device/Splint Location: (B) LE Ortho Device/Splint Interventions: Ordered;Application   [name] 05/13/2013, 6:37 PM

## 2013-05-13 NOTE — Progress Notes (Signed)
ANTICOAGULATION CONSULT NOTE - Initial Consult  Pharmacy Consult for Coumadin Indication: VTE prophylaxis  Allergies  Allergen Reactions  . Iohexol      Desc: IV CONTRAST CAUSE NEPHROPATHY IN 2007   . Ivp Dye (Iodinated Diagnostic Agents)   . Morphine Sulfate Itching and Rash    Patient Measurements:     Vital Signs: Temp: 98.2 F (36.8 C) (05/23 1445) Temp src: Oral (05/23 0959) BP: 179/85 mmHg (05/23 1445) Pulse Rate: 73 (05/23 1445)  Labs:  Recent Labs  05/10/13 1553  HGB 12.9  HCT 40.1  PLT 201  APTT 33  LABPROT 13.7  INR 1.06  CREATININE 0.83    The CrCl is unknown because both a height and weight (above a minimum accepted value) are required for this calculation.   Medical History: Past Medical History  Diagnosis Date  . Depression   . GERD (gastroesophageal reflux disease)   . Hyperlipidemia   . Hypertension   . Glaucoma   . PVD (peripheral vascular disease) with claudication     Stents to bilateral common iliac arteries (left 2005, right 2008), on chronic plavix  . Tinnitus   . Diabetic peripheral neuropathy   . Fibromyalgia   . Hyperplastic colon polyp 12/2010    Per colonoscopy (12/2010) - Dr. Arlyce Dice  . Diverticulosis   . Asthma   . COPD 01/08/2007    PFT's 05/2007 : FEV1/FVC 82, FEV1 64% pred, FEF 25-75% 40% predicted, 16% improvement in FEV1 with bronchodilators.     . Type II diabetes mellitus with peripheral circulatory disorders, uncontrolled (250.72) DX: 1993    Insulin dep. Poor control. Complicated by diabetic foot ulcer and diabetic eye disease.    . Chronic pain syndrome 12/03/2011    Likely secondary to depression, "fibromyalgia", neuropathy, and obesity.    . Tobacco abuse   . Chronic osteomyelitis of foot     chronic, right secondary to diabetic foot ulcers  . Polymicrobial bacterial infection 01/2013    GBS and S. aureus bacteremia // Source likely infected diabetic foot ulcer  . 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 Chronic diastolic heart failure     grade 2 per 2D echocardiogram (01/2013)  . Ulcer of foot, chronic     Left. No OM per MRI (01/2013)  . DVT of upper extremity (deep vein thrombosis) 03/11/2013    Secondary to PICC line. Right brachial vein, diagnosed on 03/10/2013 Coumadin for 3 months. End date 06/10/2013   . Arthritis     Medications:  Prescriptions prior to admission  Medication Sig Dispense Refill  . albuterol (PROAIR HFA) 108 (90 BASE) MCG/ACT inhaler Inhale 2 puffs into the lungs every 6 (six) hours as needed for wheezing.  8.5 each  5  . albuterol (PROVENTIL) (5 MG/ML) 0.5% nebulizer solution Take 2.5 mg by nebulization every 6 (six) hours as needed for wheezing or shortness of breath.      Marland Kitchen aspirin EC 81 MG tablet Take 81 mg by mouth every morning.      Marland Kitchen atenolol (TENORMIN) 100 MG tablet Take 100 mg by mouth every morning.      . baclofen (LIORESAL) 10 MG tablet Take 10 mg by mouth 3 (three) times daily.      . benazepril (LOTENSIN) 40 MG tablet Take 40 mg by mouth every morning.      Marland Kitchen buPROPion [facility]  SR) 150 MG 12 hr tablet Take 1 tablet (150 mg total) by mouth 2 (two) times daily.  60 tablet  5  . collagenase (SANTYL) ointment Apply 1 application topically daily.      . diphenhydrAMINE (BENADRYL) 25 MG tablet Take 25 mg by mouth every 8 (eight) hours.      Marland Kitchen esomeprazole (NEXIUM) 20 MG capsule Take 1 capsule (20 mg total) by mouth daily before breakfast.  30 capsule  5  . Fluticasone-Salmeterol (ADVAIR DISKUS) 250-50 MCG/DOSE AEPB Inhale 1 puff into the lungs 2 (two) times daily.  60 each  5  . furosemide (LASIX) 40 MG tablet Take 1 tablet (40 mg total) by mouth daily.  30 tablet  1  . Insulin Lispro Prot & Lispro (HUMALOG MIX 75/25 KWIKPEN) (75-25) 100 UNIT/ML SUPN Inject 75-130 Units into the skin 2 (two) times daily. 130 units in the morning and 75 units in the  evening      . ipratropium (ATROVENT) 0.02 % nebulizer solution Take 0.5 mg by nebulization every 6 (six) hours as needed for wheezing.      Marland Kitchen oxyCODONE-acetaminophen (PERCOCET) 7.5-325 MG per tablet Take 1 tablet by mouth every 6 (six) hours as needed for pain.       . pravastatin (PRAVACHOL) 40 MG tablet Take 40 mg by mouth every morning.      . pregabalin (LYRICA) 200 MG capsule Take 1 capsule (200 mg total) by mouth 3 (three) times daily.  90 capsule  3  . silver sulfADIAZINE (SILVADENE) 1 % cream Apply 1 application topically daily. Apply to feet after soaking      . fluticasone (FLONASE) 50 MCG/ACT nasal spray Place 2 sprays into the nose 2 (two) times daily as needed for allergies.      Marland Kitchen glucose blood test strip Check blood sugar up to 4 times a day before meals and bedtime Dx code- 250.72, insulin requiring  150 each  12  . metFORMIN (GLUCOPHAGE) 1000 MG tablet Take 1,000 mg by mouth 2 (two) times daily.      Marland Kitchen warfarin (COUMADIN) 5 MG tablet Take 2.5-5 mg by mouth daily. Alternates between 2.5 mg and 5 mg daily        Assessment: 52 y.o. female presents with large ulcerative diabetic wounds on both feet. S/p debridement and skin grafts 5/23. Coumadin per pharmacy ordered post-op for VTE prophylaxis. Baseline INR 1.06. Baseline CBC ok.  Pt on coumadin recently for DVT due to PICC line - per pt, MD stopped this medication completely about 2 weeks ago. She states he stopped because he felt she had received treatment for long enough. (noted last dose was 2.5mg  alternating with 5mg )  Goal of Therapy:  INR 2-3 Monitor platelets by anticoagulation protocol: Yes   Plan:  1. Coumadin 7.5 mg po tonight 2. Daily INR 3. Educational book to bedside - she remembers video so will not have her rewatch  Christoper Fabian, PharmD, BCPS Clinical pharmacist, pager [phone] 05/13/2013,3:17 PM

## 2013-05-13 NOTE — Anesthesia Procedure Notes (Signed)
Procedure Name: LMA Insertion Date/Time: 05/13/2013 11:47 AM Performed by: Arlice Colt B Pre-anesthesia Checklist: Patient identified, Emergency Drugs available, Suction available, Patient being monitored and Timeout performed Patient Re-evaluated:Patient Re-evaluated prior to inductionOxygen Delivery Method: Circle system utilized Preoxygenation: Pre-oxygenation with 100% oxygen Intubation Type: IV induction LMA: LMA inserted LMA Size: 5.0 Number of attempts: 1 Placement Confirmation: positive ETCO2 and breath sounds checked- equal and bilateral Tube secured with: Tape Dental Injury: Teeth and Oropharynx as per pre-operative assessment

## 2013-05-13 NOTE — Op Note (Signed)
OPERATIVE REPORT  DATE OF SURGERY: 05/13/2013  PATIENT:  Jennifer [name],  52 y.o. female  PRE-OPERATIVE DIAGNOSIS:  Bilateral Foot Ulcers  POST-OPERATIVE DIAGNOSIS:  Bilateral Foot Ulcers  PROCEDURE:  Procedure(s): Right and Left Foot Allograft Skin Graft  right foot 6 x 8 cm wound. Left foot 5 x 9 cm wound. Excisional debridement skin soft tissue.  SURGEON:  Surgeon(s): Nadara Mustard, MD  ANESTHESIA:   general  EBL:  Min  ML  SPECIMEN:  No Specimen  TOURNIQUET:  * No tourniquets in log *  PROCEDURE DETAILS: Patient is a 52 year old woman with diabetic insensate neuropathy with bilateral foot ulcers. Patient has failed conservative wound care treatment has persistent ulcerations and presents at this time for surgical intervention. Risks and benefits were discussed including infection neurovascular injury persistent pain persistent ulceration need for additional surgery. Patient states she understands and wished to proceed at this time. Description of procedure patient was brought to the operating room and underwent a general anesthetic. After adequate levels of anesthesia were obtained bilateral lower extremities were prepped using DuraPrep and draped into a sterile field. The wounds were ellipsed around the edges as well as the base of the wounds with a 10 blade knife the skin soft tissue was debrided from both wounds back to bleeding viable granulation tissue. The wounds were irrigated and hemostasis was obtained. 3 of the 2 x 3" allograft split thickness skin grafts were then applied to both feet. These were held in place with staples. The wounds were covered with Adaptic orthopedic sponges AB dressing Kerlix and Coban. Patient was extubated taken to the PACU in stable condition plan for 23 hour observation.  PLAN OF CARE: Admit for overnight observation  PATIENT DISPOSITION:  PACU - hemodynamically stable.   Nadara Mustard, MD 05/13/2013 12:06 PM

## 2013-05-13 NOTE — Anesthesia Preprocedure Evaluation (Addendum)
Anesthesia Evaluation  Patient identified by MRN, date of birth, ID band Patient awake    Reviewed: Allergy & Precautions, H&P , NPO status , Patient's Chart, lab work & pertinent test results  Airway Mallampati: II TM Distance: >3 FB Neck ROM: Full    Dental  (+) Dental Advisory Given   Pulmonary asthma , COPD   Pulmonary exam normal       Cardiovascular hypertension, Pt. on medications + Peripheral Vascular Disease and +CHF     Neuro/Psych PSYCHIATRIC DISORDERS Depression    GI/Hepatic Neg liver ROS, GERD-  ,  Endo/Other  diabetes, Type 1, Insulin Dependent  Renal/GU negative Renal ROS     Musculoskeletal  (+) Fibromyalgia -  Abdominal   Peds  Hematology negative hematology ROS (+)   Anesthesia Other Findings   Reproductive/Obstetrics                          Anesthesia Physical Anesthesia Plan  ASA: IV  Anesthesia Plan: General   Post-op Pain Management:    Induction: Intravenous  Airway Management Planned: LMA  Additional Equipment:   Intra-op Plan:   Post-operative Plan: Extubation in OR  Informed Consent: I have reviewed the patients History and Physical, chart, labs and discussed the procedure including the risks, benefits and alternatives for the proposed anesthesia with the patient or authorized representative who has indicated his/her understanding and acceptance.   Dental advisory given  Plan Discussed with: CRNA, Anesthesiologist and Surgeon  Anesthesia Plan Comments:        Anesthesia Quick Evaluation

## 2013-05-13 NOTE — Progress Notes (Signed)
Plan for discharge to home Saturday. No prescriptions provided. Discharged and followup orders completed.

## 2013-05-13 NOTE — H&P (Signed)
Jennifer [name] is an 52 y.o. female.   Chief Complaint: Large ulcerative diabetic insensate wounds both feet HPI: Patient is a 52 year old woman with peripheral vascular disease hypertension diabetes with a large ulcers both feet which have failed conservative treatment.  Past Medical History  Diagnosis Date  . Depression   . GERD (gastroesophageal reflux disease)   . Hyperlipidemia   . Hypertension   . Glaucoma   . PVD (peripheral vascular disease) with claudication     Stents to bilateral common iliac arteries (left 2005, right 2008), on chronic plavix  . Tinnitus   . Diabetic peripheral neuropathy   . Fibromyalgia   . Hyperplastic colon polyp 12/2010    Per colonoscopy (12/2010) - Dr. Arlyce Dice  . Diverticulosis   . Asthma   . COPD 01/08/2007    PFT's 05/2007 : FEV1/FVC 82, FEV1 64% pred, FEF 25-75% 40% predicted, 16% improvement in FEV1 with bronchodilators.     . Type II diabetes mellitus with peripheral circulatory disorders, uncontrolled (250.72) DX: 1993    Insulin dep. Poor control. Complicated by diabetic foot ulcer and diabetic eye disease.    . Chronic pain syndrome 12/03/2011    Likely secondary to depression, "fibromyalgia", neuropathy, and obesity.    . Tobacco abuse   . Chronic osteomyelitis of foot     chronic, right secondary to diabetic foot ulcers  . Polymicrobial bacterial infection 01/2013    GBS and S. aureus bacteremia // Source likely infected diabetic foot ulcer  . 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 Chronic diastolic heart failure     grade 2 per 2D echocardiogram (01/2013)  . Ulcer of foot, chronic     Left. No OM per MRI (01/2013)  . DVT of upper extremity (deep vein thrombosis) 03/11/2013    Secondary to PICC line. Right brachial vein, diagnosed on 03/10/2013 Coumadin for 3 months. End date 06/10/2013   . Arthritis     Past Surgical  History  Procedure Laterality Date  . Abdominal hysterectomy  1997    secondary to uterine fibroids  . Tubal ligation    . Rotator cuff repair      bilateral  . Breast biopsy      multiple-benign per pt  . Wrist surgery      right  . Ganglion cyst excision      multiple  . Other surgical history      stents in lower ext  . Tee without cardioversion N/A 01/31/2013    Procedure: TRANSESOPHAGEAL ECHOCARDIOGRAM (TEE);  Surgeon: Pricilla Riffle, MD;  Location: [facility] ENDOSCOPY;  Service: Cardiovascular;  Laterality: N/A;  Rm [phone]  . Bladder surgery      bladder reconstruction surgery  . Tee without cardioversion N/A 03/10/2013    Procedure: TRANSESOPHAGEAL ECHOCARDIOGRAM (TEE);  Surgeon: Laurey Morale, MD;  Location: [facility] ENDOSCOPY;  Service: Cardiovascular;  Laterality: N/A;  Rm. [phone]    Family History  Problem Relation Age of Onset  . Diverticulosis Mother   . Diabetes Mother   . Hypertension Mother   . Congestive Heart Failure Mother   . Asthma Father    Social History:  reports that she has been smoking Cigarettes.  She has a 60 pack-year smoking history. She does not have any smokeless tobacco history on file. She reports that she uses illicit drugs (Marijuana and "Crack" cocaine). She reports that  she does not drink alcohol.  Allergies:  Allergies  Allergen Reactions  . Iohexol      Desc: IV CONTRAST CAUSE NEPHROPATHY IN 2007   . Ivp Dye (Iodinated Diagnostic Agents)   . Morphine Sulfate Itching and Rash    No prescriptions prior to admission    Results for orders placed in visit on 05/11/13 (from the past 48 hour(s))  GLUCOSE, CAPILLARY     Status: None   Collection Time    05/11/13 11:04 AM      Result Value Range   Glucose-Capillary 88  70 - 99 mg/dL  POCT GLYCOSYLATED HEMOGLOBIN (HGB A1C)     Status: None   Collection Time    05/11/13 11:14 AM      Result Value Range   Hemoglobin A1C 8.0     No results found.  Review of Systems  All other systems reviewed and  are negative.    There were no vitals taken for this visit. Physical Exam  On examination patient has ulcers which encompassed almost the entire plantar aspect of both feet. She has failed conservative wound care and presents at this time for skin grafting. Assessment/Plan Assessment: Diabetic insensate neuropathy with large Wagner grade 1 ulcers plantar aspect bilateral feet.  Plan. Will plan for excisional debridement placement of split thickness skin graft. Risks and benefits of surgery were discussed including infection neurovascular injury nonhealing of the wound potential for amputation. Patient states she understands and wished to proceed at this time.  Jennifer [name] V 05/13/2013, 6:28 AM

## 2013-05-13 NOTE — Discharge Instructions (Signed)
Keep dressings clean dry and intact until followup. Minimize weightbearing both lower extremities. Keep legs elevated even with the heart as much as possible.

## 2013-05-13 NOTE — Anesthesia Postprocedure Evaluation (Signed)
Anesthesia Post Note  Patient: Jennifer [name]  Procedure(s) Performed: Procedure(s) (LRB): Right and Left Foot Allograft Skin Graft (Bilateral)  Anesthesia type: general  Patient location: PACU  Post pain: Pain level controlled  Post assessment: Patient's Cardiovascular Status Stable  Last Vitals:  Filed Vitals:   05/13/13 1330  BP:   Pulse: 72  Temp:   Resp: 17    Post vital signs: Reviewed and stable  Level of consciousness: sedated  Complications: No apparent anesthesia complications

## 2013-05-13 NOTE — Transfer of Care (Signed)
Immediate Anesthesia Transfer of Care Note  Patient: Jennifer [name]  Procedure(s) Performed: Procedure(s) with comments: Right and Left Foot Allograft Skin Graft (Bilateral) - Right and Left Foot Allograft Skin Graft  Patient Location: PACU  Anesthesia Type:General  Level of Consciousness: awake, alert  and oriented  Airway & Oxygen Therapy: Patient Spontanous Breathing and Patient connected to face mask oxygen  Post-op Assessment: Report given to PACU RN and Post -op Vital signs reviewed and stable  Post vital signs: Reviewed and stable  Complications: No apparent anesthesia complications

## 2013-05-13 NOTE — Discharge Summary (Signed)
Patient underwent bilateral foot split thickness skin grafts. Postoperatively she was maintained 23 hours for pain control. She was discharged to home in stable condition.

## 2013-05-14 DIAGNOSIS — E1149 Type 2 diabetes mellitus with other diabetic neurological complication: Secondary | ICD-10-CM | POA: Diagnosis not present

## 2013-05-14 LAB — PROTIME-INR
INR: 1.06 (ref 0.00–1.49)
Prothrombin Time: 13.7 seconds (ref 11.6–15.2)

## 2013-05-14 LAB — GLUCOSE, CAPILLARY
Glucose-Capillary: 306 mg/dL — ABNORMAL HIGH (ref 70–99)
Glucose-Capillary: 228 mg/dL — ABNORMAL HIGH (ref 70–99)

## 2013-05-14 MED ORDER — PROMETHAZINE HCL 12.5 MG PO TABS: 12.5000 mg | ORAL_TABLET | Freq: Four times a day (QID) | ORAL | Status: DC | PRN

## 2013-05-14 NOTE — Progress Notes (Signed)
[name], female   DOB: [birth year], 52 y.o.   MRN: [id number] Comfortable but with nausea.  Dressing bloody and re-enforced.  Will discharge to home today.

## 2013-05-18 ENCOUNTER — Encounter (HOSPITAL_COMMUNITY): Payer: Self-pay | Admitting: Orthopedic Surgery

## 2013-05-24 ENCOUNTER — Telehealth: Payer: Self-pay | Admitting: *Deleted

## 2013-05-24 ENCOUNTER — Encounter: Payer: Medicare Other | Admitting: Radiation Oncology

## 2013-05-24 NOTE — Telephone Encounter (Signed)
Pt calls and states she cannot sleep and wants to start getting her "sleeping medicine" again

## 2013-05-24 NOTE — Telephone Encounter (Signed)
I have no idea what she takes. We discuss at a visit stopping a med and then I get refill request for that med. Pleas ask her what med she is referring to. The Trazadone? Thanks

## 2013-05-25 ENCOUNTER — Ambulatory Visit (INDEPENDENT_AMBULATORY_CARE_PROVIDER_SITE_OTHER): Payer: Medicare Other | Admitting: Internal Medicine

## 2013-05-25 ENCOUNTER — Encounter: Payer: Self-pay | Admitting: Internal Medicine

## 2013-05-25 ENCOUNTER — Other Ambulatory Visit: Payer: Self-pay | Admitting: *Deleted

## 2013-05-25 VITALS — BP 130/80 | HR 70 | Temp 97.2°F | Ht 67.0 in | Wt 290.2 lb

## 2013-05-25 DIAGNOSIS — I798 Other disorders of arteries, arterioles and capillaries in diseases classified elsewhere: Secondary | ICD-10-CM

## 2013-05-25 DIAGNOSIS — R7989 Other specified abnormal findings of blood chemistry: Secondary | ICD-10-CM

## 2013-05-25 DIAGNOSIS — R7401 Elevation of levels of liver transaminase levels: Secondary | ICD-10-CM

## 2013-05-25 DIAGNOSIS — I10 Essential (primary) hypertension: Secondary | ICD-10-CM

## 2013-05-25 DIAGNOSIS — I82629 Acute embolism and thrombosis of deep veins of unspecified upper extremity: Secondary | ICD-10-CM

## 2013-05-25 DIAGNOSIS — E1159 Type 2 diabetes mellitus with other circulatory complications: Secondary | ICD-10-CM

## 2013-05-25 DIAGNOSIS — R7402 Elevation of levels of lactic acid dehydrogenase (LDH): Secondary | ICD-10-CM

## 2013-05-25 DIAGNOSIS — I5032 Chronic diastolic (congestive) heart failure: Secondary | ICD-10-CM

## 2013-05-25 LAB — COMPLETE METABOLIC PANEL WITH GFR
ALT: 32 U/L (ref 0–35)
AST: 46 U/L — ABNORMAL HIGH (ref 0–37)
Albumin: 3.2 g/dL — ABNORMAL LOW (ref 3.5–5.2)
Alkaline Phosphatase: 77 U/L (ref 39–117)
BUN: 19 mg/dL (ref 6–23)
CO2: 29 mEq/L (ref 19–32)
Calcium: 8.8 mg/dL (ref 8.4–10.5)
Chloride: 103 mEq/L (ref 96–112)
Creat: 0.83 mg/dL (ref 0.50–1.10)
GFR, Est African American: >89 mL/min
GFR, Est Non African American: 80 mL/min
Glucose, Bld: 220 mg/dL — ABNORMAL HIGH (ref 70–99)
Potassium: 4.2 mEq/L (ref 3.5–5.3)
Sodium: 140 mEq/L (ref 135–145)
Total Bilirubin: 0.4 mg/dL (ref 0.3–1.2)
Total Protein: 6.1 g/dL (ref 6.0–8.3)

## 2013-05-25 MED ORDER — FUROSEMIDE 40 MG PO TABS: 80.0000 mg | ORAL_TABLET | Freq: Every day | ORAL | Status: DC

## 2013-05-25 MED ORDER — TRAZODONE HCL 100 MG PO TABS: 100.0000 mg | ORAL_TABLET | Freq: Every day | ORAL | Status: DC

## 2013-05-25 NOTE — Telephone Encounter (Signed)
Pt states she has not slept in 3 days

## 2013-05-25 NOTE — Assessment & Plan Note (Signed)
Mild elevations LFTs noted 05/10/13, thought 2/2 concomitant use pravastatin and simvastatin. Told to d/c simvastatin.  Patient says she has since been taking just one, can't remember.  - Instructed to ONLY take pravastatin, highlighted this on her med list on AVS - Recheck lfts today

## 2013-05-25 NOTE — Patient Instructions (Addendum)
1. Increase lasix to 80mg  daily (2 pills a day). 2. Come back Monday for follow up appointments with me, Dr. Alexandria Lodge and [name] Plyler. PLEASE BRING YOUR SUGAR METER!

## 2013-05-25 NOTE — Progress Notes (Signed)
INTERNAL MEDICINE TEACHING ATTENDING ADDENDUM: I discussed this case with Dr. Ziemer soon after the patient visit. I agree with her HPI, exam findings and diagnoses. I have read her documentation and I agree with her plan of care. Please see the resident note above for details of management.    

## 2013-05-25 NOTE — Progress Notes (Signed)
Patient ID: MALAYA CAGLEY, female   DOB: [birth year], 52 y.o.   MRN: [id number]  Subjective:   Patient ID: TAEYA THEALL female   DOB: 1961-02-23 52 y.o.   MRN: [id number]  HPI: Ms.Tanazia L Lentz is a 52 y.o. female with history of depression, PVD, COPD, HTN, HLD, T2DM, chronic diastolic HF  presenting to the clinic with complaint of LE edema and weight gian . At last office visit 2 weeks ago was noted to have worsening LE edema from  [facility]. Lasix dose increased from 20mg  --> 40mg  daily. Still with LE swelling and 7lb weight gain since last office visit. Denies worsening SOB, but not exercising very much due to arthritis pain. No chest pain/pressure.  At time of last visit, also noted to have labile blood sugars on metformin 100 BID and Humalog 120q am and 75 q pm. Glucometer msmts ranging from 130-500 and A1c of 8.0, but CBG of 50 in the clinic. Was instructed to increase humalog to 135qpm and 75 q am. Today reports that sugars high, 100s to 300s and also with low down to 60, but did not bring meter. Has appt w Norm Parcel Monday. Has ophtho appt for this month.  Also noted to have mild transaminitis, suspected secondary to concomitant use of pravastatin and simvastatin. Was instructed to discontinue simvastatin. Says she stopped one statin, cannot remember which one.  Since last clinic visit she underwent excisional debridement adn split thickness skin grafting of bilateral feet by Dr. Lajoyce Corners for her bilateral foot ulcers 05/13/13. Says she is healing well.     Past Medical History  Diagnosis Date  . Depression   . GERD (gastroesophageal reflux disease)   . Hyperlipidemia   . Hypertension   . Glaucoma   . PVD (peripheral vascular disease) with claudication     Stents to bilateral common iliac arteries (left 2005, right 2008), on chronic plavix  . Tinnitus   . Diabetic peripheral neuropathy   . Fibromyalgia   . Hyperplastic colon polyp 12/2010    Per colonoscopy (12/2010) - Dr. Arlyce Dice  .  Diverticulosis   . Asthma   . COPD 01/08/2007    PFT's 05/2007 : FEV1/FVC 82, FEV1 64% pred, FEF 25-75% 40% predicted, 16% improvement in FEV1 with bronchodilators.     . Type II diabetes mellitus with peripheral circulatory disorders, uncontrolled (250.72) DX: 1993    Insulin dep. Poor control. Complicated by diabetic foot ulcer and diabetic eye disease.    . Chronic pain syndrome 12/03/2011    Likely secondary to depression, "fibromyalgia", neuropathy, and obesity.    . Tobacco abuse   . Chronic osteomyelitis of foot     chronic, right secondary to diabetic foot ulcers  . Polymicrobial bacterial infection 01/2013    GBS and S. aureus bacteremia // Source likely infected diabetic foot ulcer  . 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 Chronic diastolic heart failure     grade 2 per 2D echocardiogram (01/2013)  . Ulcer of foot, chronic     Left. No OM per MRI (01/2013)  . DVT of upper extremity (deep vein thrombosis) 03/11/2013    Secondary to PICC line. Right brachial vein, diagnosed on 03/10/2013 Coumadin for 3 months. End date 06/10/2013   . Arthritis    Current Outpatient Prescriptions  Medication Sig Dispense Refill  . albuterol (PROAIR HFA) 108 (90  BASE) MCG/ACT inhaler Inhale 2 puffs into the lungs every 6 (six) hours as needed for wheezing.  8.5 each  5  . albuterol (PROVENTIL) (5 MG/ML) 0.5% nebulizer solution Take 2.5 mg by nebulization every 6 (six) hours as needed for wheezing or shortness of breath.      Marland Kitchen aspirin EC 81 MG tablet Take 81 mg by mouth every morning.      Marland Kitchen atenolol (TENORMIN) 100 MG tablet Take 100 mg by mouth every morning.      . baclofen (LIORESAL) 10 MG tablet Take 10 mg by mouth 3 (three) times daily.      . benazepril (LOTENSIN) 40 MG tablet Take 40 mg by mouth every morning.      Marland Kitchen buPROPion (WELLBUTRIN SR) 150 MG 12 hr tablet Take 1 tablet (150  mg total) by mouth 2 (two) times daily.  60 tablet  5  . collagenase (SANTYL) ointment Apply 1 application topically daily.      . diphenhydrAMINE (BENADRYL) 25 MG tablet Take 25 mg by mouth every 8 (eight) hours.      Marland Kitchen esomeprazole (NEXIUM) 20 MG capsule Take 1 capsule (20 mg total) by mouth daily before breakfast.  30 capsule  5  . fluticasone (FLONASE) 50 MCG/ACT nasal spray Place 2 sprays into the nose 2 (two) times daily as needed for allergies.      . Fluticasone-Salmeterol (ADVAIR DISKUS) 250-50 MCG/DOSE AEPB Inhale 1 puff into the lungs 2 (two) times daily.  60 each  5  . furosemide (LASIX) 40 MG tablet Take 2 tablets (80 mg total) by mouth daily.  60 tablet  1  . glucose blood test strip Check blood sugar up to 4 times a day before meals and bedtime Dx code- 250.72, insulin requiring  150 each  12  . Insulin Lispro Prot & Lispro (HUMALOG MIX 75/25 KWIKPEN) (75-25) 100 UNIT/ML SUPN Inject 75-130 Units into the skin 2 (two) times daily. 130 units in the morning and 75 units in the evening      . ipratropium (ATROVENT) 0.02 % nebulizer solution Take 0.5 mg by nebulization every 6 (six) hours as needed for wheezing.      Marland Kitchen levofloxacin (LEVAQUIN) 750 MG tablet       . metFORMIN (GLUCOPHAGE) 1000 MG tablet Take 1,000 mg by mouth 2 (two) times daily.      Marland Kitchen oxyCODONE-acetaminophen (PERCOCET) 7.5-325 MG per tablet Take 1 tablet by mouth every 6 (six) hours as needed for pain.       . pravastatin (PRAVACHOL) 40 MG tablet Take 40 mg by mouth every morning.      . predniSONE (DELTASONE) 20 MG tablet       . pregabalin (LYRICA) 200 MG capsule Take 1 capsule (200 mg total) by mouth 3 (three) times daily.  90 capsule  3  . promethazine (PHENERGAN) 12.5 MG tablet Take 1 tablet (12.5 mg total) by mouth every 6 (six) hours as needed for nausea.  20 tablet  0  . silver sulfADIAZINE (SILVADENE) 1 % cream       . warfarin (COUMADIN) 5 MG tablet Take 2.5-5 mg by mouth daily. Alternates between 2.5 mg and 5  mg daily       No current facility-administered medications for this visit.   Family History  Problem Relation Age of Onset  . Diverticulosis Mother   . Diabetes Mother   . Hypertension Mother   . Congestive Heart Failure Mother   . Asthma Father  History   Social History  . Marital Status: Single    Spouse Name: N/A    Number of Children: 2  . Years of Education: college   Occupational History  . Disability     previously worked as a Psychologist, counselling History Main Topics  . Smoking status: Current Every Day Smoker -- 1.50 packs/day for 40 years    Types: Cigarettes  . Smokeless tobacco: Never Used     Comment: Half pack or less. Smokes anpack per week.      '  I already have information on quitting "  . Alcohol Use: No  . Drug Use: Yes    Special: Marijuana, "Crack" cocaine     Comment: none in many years  . Sexually Active: None     Comment: states she is currently trying to quit smoking   Other Topics Concern  . None   Social History Narrative   On disability. Lives with son in [place]. 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    Boyfriend passed away stage 4 cancer 03/23/2013.   Review of Systems: 10 pt ROS performed, pertinent positives and negatives noted in HPI Objective:  Physical Exam: Filed Vitals:   05/25/13 1404  BP: 130/80  Pulse: 70  Temp: 97.2 F (36.2 C)  TempSrc: Oral  Height: 5\' 7"  (1.702 m)  Weight: 290 lb 3.2 oz (131.634 kg)  SpO2: 100%   Vitals reviewed. General: overweight female in wheelchair, NAD HEENT: EOMI, no scleral icterus Cardiac: RRR, no rubs, murmurs or gallops Pulm: Normal WOB, CTAB, No rales appreciated Abd: obese, NTND Ext: 2+ pitting edema to knees, chronic venous stasis changes of skin. No blisters seen. Wearing boots and wrappings b/l feet from skin grafting procedure.  Neuro: alert and oriented X3, cranial nerves II-XII grossly intact  . Assessment & Plan:   Please see problem-based charting for assessment and plan.

## 2013-05-25 NOTE — Assessment & Plan Note (Signed)
Did not bring meter today. Sugars labile, 60-300s. Has appt w Norm Parcel on Monday.  Instructed to bring meter, will schedule f/u for DM w me Monday as well, can make adjustments with more data.  Reviewed insulin hold parameters, when to call clinci.

## 2013-05-25 NOTE — Telephone Encounter (Signed)
Left a vmail for rtc

## 2013-05-25 NOTE — Assessment & Plan Note (Signed)
BP Readings from Last 3 Encounters:  05/25/13 130/80  05/14/13 152/88  05/14/13 152/88    Lab Results  Component Value Date   NA 141 05/10/2013   K 4.4 05/10/2013   CREATININE 0.83 05/10/2013    Assessment: Blood pressure control: controlled Progress toward BP goal:  at goal   Plan: Medications:  continue current medications

## 2013-05-25 NOTE — Assessment & Plan Note (Addendum)
7lb weight gain since last visit 2 weeks ago, tighter LE. No rales, worsening dyspnea, CP or SOB. - Increase lasix to 80 qd x1 week - F/U Monday - Discussed alarm sx, return parameters  ADDENDUM 05/26/2013  BMET    Component Value Date/Time   NA 140 05/25/2013 1435   K 4.2 05/25/2013 1435   CL 103 05/25/2013 1435   CO2 29 05/25/2013 1435   GLUCOSE 220* 05/25/2013 1435   BUN 19 05/25/2013 1435   CREATININE 0.83 05/25/2013 1435   CREATININE 0.83 05/10/2013 1553   CALCIUM 8.8 05/25/2013 1435   GFRNONAA 79* 05/10/2013 1553   GFRAA >90 05/10/2013 1553   Renal fxn, K look good. Continue current therapy.

## 2013-05-25 NOTE — Assessment & Plan Note (Signed)
On coumadin through 06/10/13 for DVT of UE, per chart review. Pt says she has not had coumadin clinic f/u in some time. Denies bleeding. - Scheduled follow up w Dr. Alexandria Lodge for Monday

## 2013-05-26 ENCOUNTER — Other Ambulatory Visit: Payer: Self-pay | Admitting: Internal Medicine

## 2013-05-26 MED ORDER — NICOTINE 10 MG IN INHA: 1.0000 | RESPIRATORY_TRACT | Status: DC | PRN

## 2013-05-26 NOTE — Telephone Encounter (Signed)
Called to pharm 

## 2013-05-30 ENCOUNTER — Ambulatory Visit (INDEPENDENT_AMBULATORY_CARE_PROVIDER_SITE_OTHER): Payer: Medicare Other | Admitting: Internal Medicine

## 2013-05-30 ENCOUNTER — Ambulatory Visit (INDEPENDENT_AMBULATORY_CARE_PROVIDER_SITE_OTHER): Payer: Medicare Other | Admitting: Pharmacist

## 2013-05-30 ENCOUNTER — Ambulatory Visit (HOSPITAL_BASED_OUTPATIENT_CLINIC_OR_DEPARTMENT_OTHER): Payer: Medicare Other | Admitting: Dietician

## 2013-05-30 ENCOUNTER — Encounter: Payer: Self-pay | Admitting: Internal Medicine

## 2013-05-30 ENCOUNTER — Ambulatory Visit: Payer: Medicare Other

## 2013-05-30 ENCOUNTER — Encounter: Payer: Medicare Other | Admitting: Dietician

## 2013-05-30 VITALS — BP 141/78 | HR 74 | Temp 97.5°F | Ht 67.0 in | Wt 288.0 lb

## 2013-05-30 VITALS — Wt 288.8 lb

## 2013-05-30 DIAGNOSIS — Z7901 Long term (current) use of anticoagulants: Secondary | ICD-10-CM

## 2013-05-30 DIAGNOSIS — G894 Chronic pain syndrome: Secondary | ICD-10-CM

## 2013-05-30 DIAGNOSIS — E1159 Type 2 diabetes mellitus with other circulatory complications: Secondary | ICD-10-CM

## 2013-05-30 DIAGNOSIS — I5032 Chronic diastolic (congestive) heart failure: Secondary | ICD-10-CM

## 2013-05-30 DIAGNOSIS — I798 Other disorders of arteries, arterioles and capillaries in diseases classified elsewhere: Secondary | ICD-10-CM

## 2013-05-30 DIAGNOSIS — I82629 Acute embolism and thrombosis of deep veins of unspecified upper extremity: Secondary | ICD-10-CM

## 2013-05-30 DIAGNOSIS — I82621 Acute embolism and thrombosis of deep veins of right upper extremity: Secondary | ICD-10-CM

## 2013-05-30 LAB — POCT INR: INR: 1.8

## 2013-05-30 MED ORDER — OXYCODONE-ACETAMINOPHEN 5-325 MG PO TABS: 2.0000 | ORAL_TABLET | Freq: Four times a day (QID) | ORAL | Status: DC | PRN

## 2013-05-30 MED ORDER — FUROSEMIDE 40 MG PO TABS: ORAL_TABLET | ORAL | Status: DC

## 2013-05-30 MED ORDER — INSULIN LISPRO PROT & LISPRO (75-25 MIX) 100 UNIT/ML KWIKPEN: 75.0000 [IU] | PEN_INJECTOR | Freq: Two times a day (BID) | SUBCUTANEOUS | Status: DC

## 2013-05-30 MED ORDER — GLUCOSE BLOOD VI STRP: ORAL_STRIP | Status: DC

## 2013-05-30 MED ORDER — ONETOUCH DELICA LANCETS FINE MISC: 1.0000 | Freq: Two times a day (BID) | Status: DC

## 2013-05-30 NOTE — Patient Instructions (Addendum)
1. Keep taking Lasix 80mg  in the morning and start taking 40mg  in the afternoon. 2. PLEASE take your Humalog 75U every night. Increase morning humalog to 140 units. 3. I am giving you a new pain med prescription. 4. Come back in 1 week.

## 2013-05-30 NOTE — Progress Notes (Signed)
Anti-Coagulation Progress Note  Jennifer [name] is a 52 y.o. female who is currently on an anti-coagulation regimen.    RECENT RESULTS: Recent results are below, the most recent result is correlated with a dose of 35 mg. per week since resumption of therapy status-post OP Surgery by Dr. Lajoyce Corners. She is so close (within 5 days) of original index UE VTE that after discussion with attending physician, AND the PATIENT--it is determined that remaining 5 days of the original 3 month treatment period will not be done--it would have required another warfarin prescription (patient had ran out). We all agree that she has had the requisite period of treatment duration for the indication. Patient will discontinue warfarin.  Lab Results  Component Value Date   INR 1.80 05/30/2013   INR 1.06 05/14/2013   INR 1.06 05/10/2013    ANTI-COAG DOSE: Anticoagulation Dose Instructions as of 05/30/2013     [name] Tue Wed Thu Fri Sat   New Dose 0 mg 0 mg 0 mg 0 mg 0 mg 0 mg 0 mg       ANTICOAG SUMMARY:   ANTICOAG TODAY: Anticoagulation Summary as of 05/30/2013   INR goal    Selected INR 1.80 (05/30/2013)   Next INR check    Target end date     Anticoagulation Episode Summary   INR check location    Preferred lab    Discontinue date 05/30/2013   Discontinue reason Therapy Completed   Send INR reminders to    Comments       PATIENT INSTRUCTIONS: Patient Instructions  Patient instructed to take medications as defined in the Anti-coagulation Track section of this encounter.  Patient instructed to DISCONTINUE ALL WARFARIN. Duration of therapy is complete. Patient verbalized understanding of these instructions.       FOLLOW-UP Return if symptoms worsen or fail to improve.  Hulen Luster, III Pharm.D., CACP

## 2013-05-30 NOTE — Assessment & Plan Note (Signed)
Still clinically appears volume overloaded with only 2 pounds abdomen weight since last visit. Only fine bibasilar crackles, no evidence of acute decompensated heart failure. - Continue Lasix 80 mg in the morning, at 40 mg the afternoon - Return to clinic in one week - Discussed alarm symptoms, return parameters

## 2013-05-30 NOTE — Progress Notes (Signed)
Medical Nutrition Therapy:  Appt start time: 1330 end time:  1420.  Assessment:  Primary concerns today: Weight management, Blood sugar control and Meal planning.   Review of patient's typical day reveals diet may be high in fat and low in vegetable intake, with inconsistent carbohydrate intake. Patient's blood sugar average was 313 and 26.5% have been within target for the past month. Most highs occur post meal.  Recent physical activity is limited by foot wounds.  Progress Towards Goal(s):  Some progress.   Nutritional Diagnosis:  NB-3.2 Limited access to food or water As related to  lack of resources to purchase foods.  As evidenced by patient report. Ongoing. NB-1.3 Not ready for diet/lifestyle change  As related to competing values (quitting smoking).  As evidenced by lack of desire to discuss food intake 03-01-13. Resolved as evidenced by patient's willingness to discuss daily food intake today. NB-1.1 Food and nutrition-related knowledge deficit as related to fat and carbohydrate content in foods. As evidenced by elevated blood sugars and weight gain.    Intervention:  Nutrition education about fat and carbohydrate content in foods, regulating meals to regulate blood sugar, and the plate method. Coordination of care: referral to social worker for assistance with obtaining food and transportation.  Monitoring/Evaluation:  Dietary intake, exercise, and body weight in 1 month(s).    Patient provided with sample one touch verio meter today. Demonstrated ability to use, blood sugar 173 mg/dL.

## 2013-05-30 NOTE — Assessment & Plan Note (Signed)
Once again interrogation of glucometer shows broad lability of blood sugars with fasting glucoses ranging from 72 to mid 500s and nighttime glucoses ranging from 126-600. She does admit missing several nighttime doses of Humalog. - Emphasized daily compliance with nighttime Humalog 75 units. Will hold off on increasing nighttime insulin if she has had several a.m. Fasting sugars in the 70s, likely mornings when she's taken her nighttime insulin. - Increase morning insulin from 130 to 140 units daily. - Followup in a week. Bring meter

## 2013-05-30 NOTE — Patient Instructions (Signed)
Patient instructed to take medications as defined in the Anti-coagulation Track section of this encounter.  Patient instructed to DISCONTINUE ALL WARFARIN. Duration of therapy is complete. Patient verbalized understanding of these instructions.

## 2013-05-30 NOTE — Progress Notes (Signed)
[name], female   DOB: July 27, 1961, 52 y.o.   MRN: [id number]  Subjective:   [name] female   DOB: [birth year] 52 y.o.   MRN: [id number]  HPI: Jennifer [name] is a 52 y.o. female with history of depression, PVD, COPD, HTN, HLD, T2DM, chronic diastolic HF presenting to the clinic for f/u of LE edema and weight gain.  At last office visit 3 weeks ago was noted to have worsening LE edema from [facility]. Lasix dose increased from 20mg  --> 40mg  daily. Last week she returned with persistent LE swelling and 7lb weight gain. I instructed her to increase lasix to 80mg  daily x 1 week. Today she reports minimal improvement of LE edema and SOB and compliance w lasix 80mg  qd.   Has had some blood sugar lability and has forgotten her meter her last couple of visits, which has made adjusting her DM meds difficult. She is currently on Humalog 135qpm and 75 q am.  Brought her glucose meter for interrogation which shows persistent lability of blood sugars. She admits to a making several nighttime doses per week of her Humalog. Some fasting blood sugars as low as 72, other sugars ranging 300-500. Suspect that these higher sugars are on mornings when she forgot to take her nighttime dose.  Has ophtho appt for this month.   Patient's PCP was contacted by home physical therapist due to concern for pain medication not adequately controlling her chronic osteoarthritic pain. She's taking Percocet 7.5/325 4 times daily. Pain is most severe in her lumbar spine. Denies any saddle anesthesia, extremity focal weakness, or numbness. Recent decrease in mobility do to bilateral skin grafting procedures and feet. Physical therapy still comes out twice weekly and nursing comes out twice weekly to examine her wound and provide wound care changes.     Past Medical History  Diagnosis Date  . Depression   . GERD (gastroesophageal reflux disease)   . Hyperlipidemia   . Hypertension   . Glaucoma   .  PVD (peripheral vascular disease) with claudication     Stents to bilateral common iliac arteries (left 2005, right 2008), on chronic plavix  . Tinnitus   . Diabetic peripheral neuropathy   . Fibromyalgia   . Hyperplastic colon polyp 12/2010    Per colonoscopy (12/2010) - Dr. Arlyce Dice  . Diverticulosis   . Asthma   . COPD 01/08/2007    PFT's 05/2007 : FEV1/FVC 82, FEV1 64% pred, FEF 25-75% 40% predicted, 16% improvement in FEV1 with bronchodilators.     . Type II diabetes mellitus with peripheral circulatory disorders, uncontrolled (250.72) DX: 1993    Insulin dep. Poor control. Complicated by diabetic foot ulcer and diabetic eye disease.    . Chronic pain syndrome 12/03/2011    Likely secondary to depression, "fibromyalgia", neuropathy, and obesity.    . Tobacco abuse   . Chronic osteomyelitis of foot     chronic, right secondary to diabetic foot ulcers  . Polymicrobial bacterial infection 01/2013    GBS and S. aureus bacteremia // Source likely infected diabetic foot ulcer  . 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 Chronic diastolic heart failure     grade 2 per 2D echocardiogram (01/2013)  . Ulcer of foot, chronic     Left. No OM per MRI (01/2013)  . DVT of upper extremity (deep  vein thrombosis) 03/11/2013    Secondary to PICC line. Right brachial vein, diagnosed on 03/10/2013 Coumadin for 3 months. End date 06/10/2013   . Arthritis    Current Outpatient Prescriptions  Medication Sig Dispense Refill  . albuterol (PROAIR HFA) 108 (90 BASE) MCG/ACT inhaler Inhale 2 puffs into the lungs every 6 (six) hours as needed for wheezing.  8.5 each  5  . albuterol (PROVENTIL) (5 MG/ML) 0.5% nebulizer solution Take 2.5 mg by nebulization every 6 (six) hours as needed for wheezing or shortness of breath.      Marland Kitchen aspirin EC 81 MG tablet Take 81 mg by mouth every morning.      Marland Kitchen atenolol  (TENORMIN) 100 MG tablet Take 100 mg by mouth every morning.      . baclofen (LIORESAL) 10 MG tablet Take 10 mg by mouth 3 (three) times daily.      . benazepril (LOTENSIN) 40 MG tablet Take 40 mg by mouth every morning.      Marland Kitchen buPROPion (WELLBUTRIN SR) 150 MG 12 hr tablet Take 1 tablet (150 mg total) by mouth 2 (two) times daily.  60 tablet  5  . collagenase (SANTYL) ointment Apply 1 application topically daily.      . diphenhydrAMINE (BENADRYL) 25 MG tablet Take 25 mg by mouth every 8 (eight) hours.      Marland Kitchen esomeprazole (NEXIUM) 20 MG capsule Take 1 capsule (20 mg total) by mouth daily before breakfast.  30 capsule  5  . fluticasone (FLONASE) 50 MCG/ACT nasal spray Place 2 sprays into the nose 2 (two) times daily as needed for allergies.      . Fluticasone-Salmeterol (ADVAIR DISKUS) 250-50 MCG/DOSE AEPB Inhale 1 puff into the lungs 2 (two) times daily.  60 each  5  . furosemide (LASIX) 40 MG tablet Take 2 tablets (80 mg total) by mouth daily.  60 tablet  1  . glucose blood test strip Check blood sugar up to 4 times a day before meals and bedtime Dx code- 250.72, insulin requiring  150 each  12  . Insulin Lispro Prot & Lispro (HUMALOG MIX 75/25 KWIKPEN) (75-25) 100 UNIT/ML SUPN Inject 75-130 Units into the skin 2 (two) times daily. 130 units in the morning and 75 units in the evening      . ipratropium (ATROVENT) 0.02 % nebulizer solution Take 0.5 mg by nebulization every 6 (six) hours as needed for wheezing.      Marland Kitchen levofloxacin (LEVAQUIN) 750 MG tablet       . metFORMIN (GLUCOPHAGE) 1000 MG tablet Take 1,000 mg by mouth 2 (two) times daily.      . nicotine (NICOTROL) 10 MG inhaler Inhale 1 puff into the lungs as needed for smoking cessation. 6 to 16 cartridges per day for 6 to 12 wks. Than contact MD for instructions  42 each  1  . oxyCODONE-acetaminophen (PERCOCET) 7.5-325 MG per tablet Take 1 tablet by mouth every 6 (six) hours as needed for pain.       . pravastatin (PRAVACHOL) 40 MG tablet Take  40 mg by mouth every morning.      . predniSONE (DELTASONE) 20 MG tablet       . pregabalin (LYRICA) 200 MG capsule Take 1 capsule (200 mg total) by mouth 3 (three) times daily.  90 capsule  3  . promethazine (PHENERGAN) 12.5 MG tablet Take 1 tablet (12.5 mg total) by mouth every 6 (six) hours as needed for nausea.  20 tablet  0  .  silver sulfADIAZINE (SILVADENE) 1 % cream       . traZODone (DESYREL) 100 MG tablet Take 1 tablet (100 mg total) by mouth at bedtime.  30 tablet  0  . warfarin (COUMADIN) 5 MG tablet Take 2.5-5 mg by mouth daily. Alternates between 2.5 mg and 5 mg daily       No current facility-administered medications for this visit.   Family History  Problem Relation Age of Onset  . Diverticulosis Mother   . Diabetes Mother   . Hypertension Mother   . Congestive Heart Failure Mother   . Asthma Father    History   Social History  . Marital Status: Single    Spouse Name: N/A    Number of Children: 2  . Years of Education: college   Occupational History  . Disability     previously worked as a Psychologist, counselling History Main Topics  . Smoking status: Current Every Day Smoker -- 0.10 packs/day for 40 years    Types: Cigarettes  . Smokeless tobacco: Never Used     Comment: Half pack or less. Smokes anpack per week.      '  I already have information on quitting "  . Alcohol Use: No  . Drug Use: Yes    Special: Marijuana, "Crack" cocaine     Comment: none in many years  . Sexually Active: None     Comment: states she is currently trying to quit smoking   Other Topics Concern  . None   Social History Narrative   On disability. Lives with son in [place]. 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    Boyfriend passed away stage 4 cancer 2013/03/23.   Review of Systems: 10 pt ROS performed, pertinent positives and negatives noted in HPI Objective:  Physical Exam: Filed Vitals:   05/30/13 1430  BP: 141/78  Pulse: 74  Temp: 97.5 F (36.4 C)  TempSrc: Oral  Height: 5\' 7"   (1.702 m)  Weight: 288 lb (130.636 kg)  SpO2: 95%   Vitals reviewed.  General: overweight female in wheelchair, NAD  HEENT: EOMI, no scleral icterus  Cardiac: RRR, no rubs, murmurs or gallops  Pulm: Normal WOB, fine bibasilar rales Abd: obese, NTND  Ext: 2+ pitting edema to knees, chronic venous stasis changes of skin. No blisters seen. Wearing boots and wrappings b/l feet from skin grafting procedure.  Neuro: alert and oriented X3, cranial nerves II-XII grossly intact   Assessment & Plan:   Please see problem-based charting for assessment and plan.

## 2013-05-30 NOTE — Assessment & Plan Note (Signed)
Is contracted for Percocet 7.5/325 one tablet every 6 hours for osteoarthritic pain. Significant concerns from her physical therapist and patient that pain medicine is no longer controlling her chronic back pain. This may be related to recent debility from hospitalization. No alarm symptoms. Her insurance does not cover Percocet 10/325. Will  provide oxycodone/acetaminophen 5/325 2 tablets every 6 hours for her osteoarthritis pain, #240 per month to be filled at [facility] [address]. New contract signed today  the

## 2013-05-31 ENCOUNTER — Other Ambulatory Visit: Payer: Self-pay | Admitting: Internal Medicine

## 2013-05-31 NOTE — Progress Notes (Signed)
Case discussed with Dr. Ziemer immediately after the resident saw the patient.  We reviewed the resident's history and exam and pertinent patient test results.  I agree with the assessment, diagnosis and plan of care documented in the resident's note. 

## 2013-06-01 NOTE — Progress Notes (Signed)
I will notify [facility] care manager.  Thank you.

## 2013-06-06 ENCOUNTER — Encounter: Payer: Self-pay | Admitting: Internal Medicine

## 2013-06-06 ENCOUNTER — Ambulatory Visit (INDEPENDENT_AMBULATORY_CARE_PROVIDER_SITE_OTHER): Payer: Medicare Other | Admitting: Internal Medicine

## 2013-06-06 VITALS — BP 150/80 | HR 64 | Temp 98.1°F | Ht 67.0 in | Wt 293.7 lb

## 2013-06-06 DIAGNOSIS — I5032 Chronic diastolic (congestive) heart failure: Secondary | ICD-10-CM

## 2013-06-06 DIAGNOSIS — G894 Chronic pain syndrome: Secondary | ICD-10-CM

## 2013-06-06 MED ORDER — BACLOFEN 10 MG PO TABS: 10.0000 mg | ORAL_TABLET | Freq: Three times a day (TID) | ORAL | Status: DC

## 2013-06-06 MED ORDER — METOLAZONE 5 MG PO TABS: 5.0000 mg | ORAL_TABLET | Freq: Every day | ORAL | Status: DC

## 2013-06-06 NOTE — Patient Instructions (Signed)
1. Keep taking your lasix 80mg  in the morning and 40mg  in the afternoon. TAKE METOLAZONE 5mg  a day for THREE DAYS. 2. Continue to weigh yourself, call if you gain more than 3 lbs in one day or 5lbs in one week. 3. Come back next week for labs  Try to keep your feet elevated. After you are cleared by Dr. Lajoyce Corners we will try to get you some stockings. Try to eat low salt meals.

## 2013-06-06 NOTE — Assessment & Plan Note (Signed)
Weight up 5 lbs over last week on lasix 80qam and 40qpm. Pt reports compliance. Sx mainly peripheral edema. Bilateral LE are still very tight. No rales or increased WOB. No evidence acute decompensated HF. Has tolerated increased doses of lasix without any bump in Cr.  - add metolazone 5mg  x3 days (T,W, R) - RTC 1 week for f/u bmet, weight  BMET    Component Value Date/Time   NA 140 05/25/2013 1435   K 4.2 05/25/2013 1435   CL 103 05/25/2013 1435   CO2 29 05/25/2013 1435   GLUCOSE 220* 05/25/2013 1435   BUN 19 05/25/2013 1435   CREATININE 0.83 05/25/2013 1435   CREATININE 0.83 05/10/2013 1553   CALCIUM 8.8 05/25/2013 1435   GFRNONAA 79* 05/10/2013 1553   GFRAA >90 05/10/2013 1553

## 2013-06-06 NOTE — Assessment & Plan Note (Signed)
Refilled baclofen today as pt says it helped with back spasm.

## 2013-06-06 NOTE — Progress Notes (Signed)
This encounter was created in error - please disregard.

## 2013-06-06 NOTE — Progress Notes (Signed)
[name], female   DOB: [birth year], 52 y.o.   MRN: [id number]  Subjective:   [name] female   DOB: 08/25/1961 52 y.o.   MRN: [id number]  HPI: [name] is a 52 y.o. female with history of depression, PVD, COPD, HTN, HLD, T2DM, chronic diastolic HF presenting to the clinic for f/u of LE edema and weight gain.  Over the past month has had worsening LE edema from [facility]. Over the past month lasix dose increased from 20mg  qd --> 80mg  q am, 40qpm. Still with persistent LE edema and now weight up 5lbs from last visit. Reports compliance w diuretic therapy. Denies worsening dyspnea, orthopnea, chest pain or pressure.  Wants RF baclofen for back spasm. Has appt w Dr. Lajoyce Corners tomorrow for follow up bilateral skin grafting of feet.  W[facility] transition [facility].   Past Medical History  Diagnosis Date  . Depression   . GERD (gastroesophageal reflux disease)   . Hyperlipidemia   . Hypertension   . Glaucoma   . PVD (peripheral vascular disease) with claudication     Stents to bilateral common iliac arteries (left 2005, right 2008), on chronic plavix  . Tinnitus   . Diabetic peripheral neuropathy   . Fibromyalgia   . Hyperplastic colon polyp 12/2010    Per colonoscopy (12/2010) - Dr. Arlyce Dice  . Diverticulosis   . Asthma   . COPD 01/08/2007    PFT's 05/2007 : FEV1/FVC 82, FEV1 64% pred, FEF 25-75% 40% predicted, 16% improvement in FEV1 with bronchodilators.     . Type II diabetes mellitus with peripheral circulatory disorders, uncontrolled (250.72) DX: 1993    Insulin dep. Poor control. Complicated by diabetic foot ulcer and diabetic eye disease.    . Chronic pain syndrome 12/03/2011    Likely secondary to depression, "fibromyalgia", neuropathy, and obesity.    . Tobacco abuse   . Chronic osteomyelitis of foot     chronic, right secondary to diabetic foot ulcers  . Polymicrobial bacterial infection 01/2013    GBS and S.  aureus bacteremia // Source likely infected diabetic foot ulcer  . 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 Chronic diastolic heart failure     grade 2 per 2D echocardiogram (01/2013)  . Ulcer of foot, chronic     Left. No OM per MRI (01/2013)  . DVT of upper extremity (deep vein thrombosis) 03/11/2013    Secondary to PICC line. Right brachial vein, diagnosed on 03/10/2013 Coumadin for 3 months. End date 06/10/2013   . Arthritis    Current Outpatient Prescriptions  Medication Sig Dispense Refill  . albuterol (PROAIR HFA) 108 (90 BASE) MCG/ACT inhaler Inhale 2 puffs into the lungs every 6 (six) hours as needed for wheezing.  8.5 each  5  . albuterol (PROVENTIL) (5 MG/ML) 0.5% nebulizer solution Take 2.5 mg by nebulization every 6 (six) hours as needed for wheezing or shortness of breath.      Marland Kitchen aspirin EC 81 MG tablet Take 81 mg by mouth every morning.      Marland Kitchen atenolol (TENORMIN) 100 MG tablet Take 100 mg by mouth every morning.      . baclofen (LIORESAL) 10 MG tablet Take 10 mg by mouth 3 (three) times daily.      . benazepril (LOTENSIN) 40 MG tablet TAKE  1 TABLET BY MOUTH EVERY DAY  30 tablet  5  . buPROPion (WELLBUTRIN SR) 150 MG 12 hr tablet Take 1 tablet (150 mg total) by mouth 2 (two) times daily.  60 tablet  5  . collagenase (SANTYL) ointment Apply 1 application topically daily.      . diphenhydrAMINE (BENADRYL) 25 MG tablet Take 25 mg by mouth every 8 (eight) hours.      Marland Kitchen esomeprazole (NEXIUM) 20 MG capsule Take 1 capsule (20 mg total) by mouth daily before breakfast.  30 capsule  5  . fluticasone (FLONASE) 50 MCG/ACT nasal spray Place 2 sprays into the nose 2 (two) times daily as needed for allergies.      . Fluticasone-Salmeterol (ADVAIR DISKUS) 250-50 MCG/DOSE AEPB Inhale 1 puff into the lungs 2 (two) times daily.  60 each  5  . furosemide (LASIX) 40 MG tablet Take 2  tablets in the morning and 1 in the afternoon  60 tablet  1  . glucose blood (ONETOUCH VERIO) test strip Check blood sugar up to 4 times a day before meals and bedtime Dx code- 250.72 insulin-requiring  150 each  12  . glucose blood test strip Check blood sugar up to 4 times a day before meals and bedtime Dx code- 250.72, insulin requiring  150 each  12  . Insulin Lispro Prot & Lispro (HUMALOG MIX 75/25 KWIKPEN) (75-25) 100 UNIT/ML SUPN Inject 75-130 Units into the skin 2 (two) times daily. 140 units in the morning and 75 units in the evening      . ipratropium (ATROVENT) 0.02 % nebulizer solution Take 0.5 mg by nebulization every 6 (six) hours as needed for wheezing.      Marland Kitchen levofloxacin (LEVAQUIN) 750 MG tablet       . metFORMIN (GLUCOPHAGE) 1000 MG tablet Take 1,000 mg by mouth 2 (two) times daily.      . nicotine (NICOTROL) 10 MG inhaler Inhale 1 puff into the lungs as needed for smoking cessation. 6 to 16 cartridges per day for 6 to 12 wks. Than contact MD for instructions  42 each  1  . ONETOUCH DELICA LANCETS FINE MISC 1 each by Does not apply route 2 (two) times daily. Check blood sugar up to 4 times a day before meals and bedtime Dx code- 250.72, insulin requiring From 03/03/2013  100 each  12  . oxyCODONE-acetaminophen (ROXICET) 5-325 MG per tablet Take 2 tablets by mouth every 6 (six) hours as needed for pain.  240 tablet  0  . pravastatin (PRAVACHOL) 40 MG tablet Take 40 mg by mouth every morning.      . predniSONE (DELTASONE) 20 MG tablet       . pregabalin (LYRICA) 200 MG capsule Take 1 capsule (200 mg total) by mouth 3 (three) times daily.  90 capsule  3  . promethazine (PHENERGAN) 12.5 MG tablet Take 1 tablet (12.5 mg total) by mouth every 6 (six) hours as needed for nausea.  20 tablet  0  . silver sulfADIAZINE (SILVADENE) 1 % cream       . traZODone (DESYREL) 100 MG tablet Take 1 tablet (100 mg total) by mouth at bedtime.  30 tablet  0   No current facility-administered medications  for this visit.   Family History  Problem Relation Age of Onset  . Diverticulosis Mother   . Diabetes Mother   . Hypertension Mother   . Congestive Heart Failure Mother   . Asthma Father    History  Social History  . Marital Status: Single    Spouse Name: N/A    Number of Children: 2  . Years of Education: college   Occupational History  . Disability     previously worked as a Psychologist, counselling History Main Topics  . Smoking status: Current Every Day Smoker -- 0.10 packs/day for 40 years    Types: Cigarettes  . Smokeless tobacco: Never Used     Comment: Half pack or less. Smokes anpack per week.      '  I already have information on quitting "  . Alcohol Use: No  . Drug Use: Yes    Special: Marijuana, "Crack" cocaine     Comment: none in many years  . Sexually Active: None     Comment: states she is currently trying to quit smoking   Other Topics Concern  . None   Social History Narrative   On disability. Lives with son in [place]. 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    Boyfriend passed away stage 4 cancer 03-22-2013.   Review of Systems: 10 pt ROS performed, pertinent positives and negatives noted in HPI Objective:  Physical Exam: Filed Vitals:   06/06/13 1544  BP: 150/80  Pulse: 64  Temp: 98.1 F (36.7 C)  TempSrc: Oral  Height: 5\' 7"  (1.702 m)  Weight: 293 lb 11.2 oz (133.221 kg)  SpO2: 94%   General: obese female in wheelchair, NAD  HEENT: EOMI, no scleral icterus  Cardiac: RRR, no rubs, murmurs or gallops  Pulm: Normal WOB, no crackles Abd: obese, NTND  Ext: 2-3+ pitting edema to knees, chronic venous stasis changes of skin. No blisters seen. Wearing boots and wrappings b/l feet from skin grafting procedure.  Neuro: alert and oriented X3, cranial nerves II-XII grossly intact  . Assessment & Plan:   Please see problem-based charting for assessment and plan.

## 2013-06-08 NOTE — Progress Notes (Signed)
Case discussed with Dr. Ziemer immediately after the resident saw the patient.  We reviewed the resident's history and exam and pertinent patient test results.  I agree with the assessment, diagnosis and plan of care documented in the resident's note. 

## 2013-06-13 ENCOUNTER — Ambulatory Visit (INDEPENDENT_AMBULATORY_CARE_PROVIDER_SITE_OTHER): Payer: Medicare Other | Admitting: Internal Medicine

## 2013-06-13 ENCOUNTER — Encounter: Payer: Self-pay | Admitting: Internal Medicine

## 2013-06-13 VITALS — BP 126/76 | HR 68 | Temp 97.2°F | Resp 20 | Ht 66.0 in | Wt 284.9 lb

## 2013-06-13 DIAGNOSIS — I5032 Chronic diastolic (congestive) heart failure: Secondary | ICD-10-CM

## 2013-06-13 DIAGNOSIS — E119 Type 2 diabetes mellitus without complications: Secondary | ICD-10-CM

## 2013-06-13 LAB — GLUCOSE, CAPILLARY: Glucose-Capillary: 325 mg/dL — ABNORMAL HIGH (ref 70–99)

## 2013-06-13 MED ORDER — FUROSEMIDE 40 MG PO TABS: ORAL_TABLET | ORAL | Status: DC

## 2013-06-13 NOTE — Patient Instructions (Addendum)
Keep taking lasix 40mg  in the morning and 40mg  at night. I will call you if we need to change this. Please bring in your weight recordings to your next visit with Dr. Rogelia Boga. Also bring your sugar meter. Remember, if you gain more than 3 lbs in a day or 5lbs in a week, please call us!  Your weight today in the clinic was 284 lbs.

## 2013-06-13 NOTE — Progress Notes (Signed)
[name], female   DOB: January 03, 1961, 52 y.o.   MRN: [id number]  Subjective:   [name] female   DOB: Feb 24, 1961 52 y.o.   MRN: [id number]  HPI: [name] is a 52 y.o. female with history of depression, PVD, COPD, HTN, HLD, T2DM, chronic diastolic HF presenting to the clinic for f/u of LE edema and weight gain.  Over the past month has had worsening LE edema from [facility]. Over the past month lasix dose increased from 20mg  qd --> 80mg  q am, 40qpm. At last visit 6/16 she was still with persistent LE edema and now weight gain 5lbs. Prescribed three days of metolazone 5mg , here for follow up. Reports compliance w diuretic therapy. Since then, improved lower leg swelling and 9 pound weight loss. Denies worsening dyspnea, orthopnea, chest pain or pressure.    Past Medical History  Diagnosis Date  . Depression   . GERD (gastroesophageal reflux disease)   . Hyperlipidemia   . Hypertension   . Glaucoma   . PVD (peripheral vascular disease) with claudication     Stents to bilateral common iliac arteries (left 2005, right 2008), on chronic plavix  . Tinnitus   . Diabetic peripheral neuropathy   . Fibromyalgia   . Hyperplastic colon polyp 12/2010    Per colonoscopy (12/2010) - Dr. Arlyce Dice  . Diverticulosis   . Asthma   . COPD 01/08/2007    PFT's 05/2007 : FEV1/FVC 82, FEV1 64% pred, FEF 25-75% 40% predicted, 16% improvement in FEV1 with bronchodilators.     . Type II diabetes mellitus with peripheral circulatory disorders, uncontrolled (250.72) DX: 1993    Insulin dep. Poor control. Complicated by diabetic foot ulcer and diabetic eye disease.    . Chronic pain syndrome 12/03/2011    Likely secondary to depression, "fibromyalgia", neuropathy, and obesity.    . Tobacco abuse   . Chronic osteomyelitis of foot     chronic, right secondary to diabetic foot ulcers  . Polymicrobial bacterial infection 01/2013    GBS and S. aureus bacteremia // Source likely  infected diabetic foot ulcer  . 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 Chronic diastolic heart failure     grade 2 per 2D echocardiogram (01/2013)  . Ulcer of foot, chronic     Left. No OM per MRI (01/2013)  . DVT of upper extremity (deep vein thrombosis) 03/11/2013    Secondary to PICC line. Right brachial vein, diagnosed on 03/10/2013 Coumadin for 3 months. End date 06/10/2013   . Arthritis    Current Outpatient Prescriptions  Medication Sig Dispense Refill  . albuterol (PROAIR HFA) 108 (90 BASE) MCG/ACT inhaler Inhale 2 puffs into the lungs every 6 (six) hours as needed for wheezing.  8.5 each  5  . albuterol (PROVENTIL) (5 MG/ML) 0.5% nebulizer solution Take 2.5 mg by nebulization every 6 (six) hours as needed for wheezing or shortness of breath.      Marland Kitchen aspirin EC 81 MG tablet Take 81 mg by mouth every morning.      Marland Kitchen atenolol (TENORMIN) 100 MG tablet Take 100 mg by mouth every morning.      . baclofen (LIORESAL) 10 MG tablet Take 1 tablet (10 mg total) by mouth 3 (three) times daily.  90 each  2  . benazepril (LOTENSIN) 40 MG tablet TAKE 1 TABLET BY  MOUTH EVERY DAY  30 tablet  5  . buPROPion (WELLBUTRIN SR) 150 MG 12 hr tablet Take 1 tablet (150 mg total) by mouth 2 (two) times daily.  60 tablet  5  . collagenase (SANTYL) ointment Apply 1 application topically daily.      . diphenhydrAMINE (BENADRYL) 25 MG tablet Take 25 mg by mouth every 8 (eight) hours.      Marland Kitchen esomeprazole (NEXIUM) 20 MG capsule Take 1 capsule (20 mg total) by mouth daily before breakfast.  30 capsule  5  . fluticasone (FLONASE) 50 MCG/ACT nasal spray Place 2 sprays into the nose 2 (two) times daily as needed for allergies.      . Fluticasone-Salmeterol (ADVAIR DISKUS) 250-50 MCG/DOSE AEPB Inhale 1 puff into the lungs 2 (two) times daily.  60 each  5  . furosemide (LASIX) 40 MG tablet Take 2 tablets in the  morning and 1 in the afternoon  60 tablet  1  . glucose blood (ONETOUCH VERIO) test strip Check blood sugar up to 4 times a day before meals and bedtime Dx code- 250.72 insulin-requiring  150 each  12  . glucose blood test strip Check blood sugar up to 4 times a day before meals and bedtime Dx code- 250.72, insulin requiring  150 each  12  . Insulin Lispro Prot & Lispro (HUMALOG MIX 75/25 KWIKPEN) (75-25) 100 UNIT/ML SUPN Inject 75-130 Units into the skin 2 (two) times daily. 140 units in the morning and 75 units in the evening      . ipratropium (ATROVENT) 0.02 % nebulizer solution Take 0.5 mg by nebulization every 6 (six) hours as needed for wheezing.      Marland Kitchen levofloxacin (LEVAQUIN) 750 MG tablet       . metFORMIN (GLUCOPHAGE) 1000 MG tablet Take 1,000 mg by mouth 2 (two) times daily.      . metolazone (ZAROXOLYN) 5 MG tablet Take 1 tablet (5 mg total) by mouth daily.  3 tablet  0  . nicotine (NICOTROL) 10 MG inhaler Inhale 1 puff into the lungs as needed for smoking cessation. 6 to 16 cartridges per day for 6 to 12 wks. Than contact MD for instructions  42 each  1  . ONETOUCH DELICA LANCETS FINE MISC 1 each by Does not apply route 2 (two) times daily. Check blood sugar up to 4 times a day before meals and bedtime Dx code- 250.72, insulin requiring From 03/03/2013  100 each  12  . oxyCODONE-acetaminophen (ROXICET) 5-325 MG per tablet Take 2 tablets by mouth every 6 (six) hours as needed for pain.  240 tablet  0  . pravastatin (PRAVACHOL) 40 MG tablet Take 40 mg by mouth every morning.      . predniSONE (DELTASONE) 20 MG tablet       . pregabalin (LYRICA) 200 MG capsule Take 1 capsule (200 mg total) by mouth 3 (three) times daily.  90 capsule  3  . promethazine (PHENERGAN) 12.5 MG tablet Take 1 tablet (12.5 mg total) by mouth every 6 (six) hours as needed for nausea.  20 tablet  0  . silver sulfADIAZINE (SILVADENE) 1 % cream       . traZODone (DESYREL) 100 MG tablet Take 1 tablet (100 mg total) by  mouth at bedtime.  30 tablet  0   No current facility-administered medications for this visit.   Family History  Problem Relation Age of Onset  . Diverticulosis Mother   . Diabetes Mother   . Hypertension  Mother   . Congestive Heart Failure Mother   . Asthma Father    History   Social History  . Marital Status: Single    Spouse Name: N/A    Number of Children: 2  . Years of Education: college   Occupational History  . Disability     previously worked as a Psychologist, counselling History Main Topics  . Smoking status: Current Every Day Smoker -- 0.50 packs/day for 40 years    Types: Cigarettes  . Smokeless tobacco: Never Used     Comment: Half pack or less. Smokes anpack per week.      '  I already have information on quitting "  . Alcohol Use: No  . Drug Use: Yes    Special: Marijuana, "Crack" cocaine     Comment: none in many years  . Sexually Active: Not on file     Comment: states she is currently trying to quit smoking   Other Topics Concern  . Not on file   Social History Narrative   On disability. Lives with son in [place]. 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    Boyfriend passed away stage 4 cancer March 13, 2013.   Review of Systems: 10 pt ROS performed, pertinent positives and negatives noted in HPI Objective:  Physical Exam: Filed Vitals:   06/13/13 1446  BP: 126/76  Pulse: 68  Temp: 97.2 F (36.2 C)  TempSrc: Oral  Resp: 20  Height: 5\' 6"  (1.676 m)  Weight: 284 lb 14.4 oz (129.23 kg)  SpO2: 99%   General: obese female in wheelchair, NAD  HEENT: EOMI, no scleral icterus  Cardiac: RRR, no rubs, murmurs or gallops  Pulm: Normal WOB, no crackles  Abd: obese, NTND  Ext: 1-2+ pitting edema to knees, chronic venous stasis changes of skin. No blisters seen. Wearing boots and wrappings b/l feet from skin grafting procedure.  Neuro: alert and oriented X3, cranial nerves II-XII grossly intact   Assessment & Plan:   Please see problem-based charting for assessment and  plan.

## 2013-06-13 NOTE — Assessment & Plan Note (Addendum)
Good response to 3 days of  metolazone 5 mg in addition to Lasix therapy with 9 pound decrement of weight since last week and improvement in lower extremity swelling. Continue Lasix 40 mg twice a day, checking a bmet today. Will call patient if further adjustment of Lasix needed.  ADDENDUM 06/14/2013 8:59 AM BMET    Component Value Date/Time   NA 138 06/13/2013 1435   K 4.2 06/13/2013 1435   CL 97 06/13/2013 1435   CO2 31 06/13/2013 1435   GLUCOSE 328* 06/13/2013 1435   BUN 29* 06/13/2013 1435   CREATININE 0.97 06/13/2013 1435   CREATININE 0.83 05/10/2013 1553   CALCIUM 9.4 06/13/2013 1435   GFRNONAA 79* 05/10/2013 1553   GFRAA >90 05/10/2013 1553   Slight bump in BUN/Cr, no ARF. Continue lasix therapy 40 BID

## 2013-06-14 LAB — BASIC METABOLIC PANEL WITH GFR
BUN: 29 mg/dL — ABNORMAL HIGH (ref 6–23)
CO2: 31 mEq/L (ref 19–32)
Calcium: 9.4 mg/dL (ref 8.4–10.5)
Chloride: 97 mEq/L (ref 96–112)
Creat: 0.97 mg/dL (ref 0.50–1.10)
GFR, Est African American: 77 mL/min
GFR, Est Non African American: 66 mL/min
Glucose, Bld: 328 mg/dL — ABNORMAL HIGH (ref 70–99)
Potassium: 4.2 mEq/L (ref 3.5–5.3)
Sodium: 138 mEq/L (ref 135–145)

## 2013-06-14 NOTE — Progress Notes (Signed)
Case discussed with Dr. Ziemer at the time of the visit.  We reviewed the resident's history and exam and pertinent patient test results.  I agree with the assessment, diagnosis, and plan of care documented in the resident's note.  

## 2013-06-15 ENCOUNTER — Encounter: Payer: Self-pay | Admitting: Internal Medicine

## 2013-06-15 ENCOUNTER — Other Ambulatory Visit: Payer: Self-pay | Admitting: Internal Medicine

## 2013-06-15 DIAGNOSIS — E1159 Type 2 diabetes mellitus with other circulatory complications: Secondary | ICD-10-CM

## 2013-06-15 DIAGNOSIS — I5032 Chronic diastolic (congestive) heart failure: Secondary | ICD-10-CM

## 2013-06-15 MED ORDER — FUROSEMIDE 40 MG PO TABS: ORAL_TABLET | ORAL | Status: DC

## 2013-06-15 MED ORDER — BUPROPION HCL ER (SR) 150 MG PO TB12: 150.0000 mg | ORAL_TABLET | Freq: Two times a day (BID) | ORAL | Status: DC

## 2013-06-15 MED ORDER — ESOMEPRAZOLE MAGNESIUM 20 MG PO CPDR: 20.0000 mg | DELAYED_RELEASE_CAPSULE | Freq: Every day | ORAL | Status: DC

## 2013-06-15 MED ORDER — PRAVASTATIN SODIUM 40 MG PO TABS: 40.0000 mg | ORAL_TABLET | Freq: Every morning | ORAL | Status: DC

## 2013-06-15 MED ORDER — FLUTICASONE PROPIONATE 50 MCG/ACT NA SUSP: 2.0000 | Freq: Two times a day (BID) | NASAL | Status: DC | PRN

## 2013-06-15 MED ORDER — TRAZODONE HCL 100 MG PO TABS: 100.0000 mg | ORAL_TABLET | Freq: Every day | ORAL | Status: DC

## 2013-06-15 MED ORDER — BENAZEPRIL HCL 40 MG PO TABS: ORAL_TABLET | ORAL | Status: DC

## 2013-06-15 MED ORDER — PREGABALIN 200 MG PO CAPS: 200.0000 mg | ORAL_CAPSULE | Freq: Three times a day (TID) | ORAL | Status: DC

## 2013-06-15 MED ORDER — ATENOLOL 100 MG PO TABS: 100.0000 mg | ORAL_TABLET | Freq: Every morning | ORAL | Status: DC

## 2013-06-15 MED ORDER — ALBUTEROL SULFATE HFA 108 (90 BASE) MCG/ACT IN AERS: 2.0000 | INHALATION_SPRAY | Freq: Four times a day (QID) | RESPIRATORY_TRACT | Status: DC | PRN

## 2013-06-15 MED ORDER — INSULIN LISPRO PROT & LISPRO (75-25 MIX) 100 UNIT/ML KWIKPEN: 75.0000 [IU] | PEN_INJECTOR | Freq: Two times a day (BID) | SUBCUTANEOUS | Status: DC

## 2013-06-15 MED ORDER — IPRATROPIUM BROMIDE 0.02 % IN SOLN: 0.5000 mg | Freq: Four times a day (QID) | RESPIRATORY_TRACT | Status: DC | PRN

## 2013-06-15 MED ORDER — FLUTICASONE-SALMETEROL 250-50 MCG/DOSE IN AEPB: 1.0000 | INHALATION_SPRAY | Freq: Two times a day (BID) | RESPIRATORY_TRACT | Status: DC

## 2013-06-15 MED ORDER — ALBUTEROL SULFATE (5 MG/ML) 0.5% IN NEBU: 2.5000 mg | INHALATION_SOLUTION | Freq: Four times a day (QID) | RESPIRATORY_TRACT | Status: DC | PRN

## 2013-06-15 MED ORDER — METFORMIN HCL 1000 MG PO TABS: 1000.0000 mg | ORAL_TABLET | Freq: Two times a day (BID) | ORAL | Status: DC

## 2013-06-15 MED ORDER — ASPIRIN EC 81 MG PO TBEC: 81.0000 mg | DELAYED_RELEASE_TABLET | Freq: Every morning | ORAL | Status: DC

## 2013-06-15 NOTE — Progress Notes (Signed)
lyrica was called into [facility] per pt request as [facility] has not approved her yet.

## 2013-06-21 ENCOUNTER — Other Ambulatory Visit: Payer: Self-pay | Admitting: Internal Medicine

## 2013-06-21 NOTE — Telephone Encounter (Signed)
How much weight gain over what time frame? Thanks

## 2013-06-21 NOTE — Telephone Encounter (Signed)
Scott with [facility] [phone] called inc in wt gain - today 06/21/13 294lb and 06/17/13 288lb. VS 128/58 pulse 94 and respirations 18. PSO2 96%. To see Dr Rogelia Boga 06/28/13 and [facility] to check on pt 06/24/13. Denies SOB. Stanton Kidney Paisli Silfies RN 06/21/13 4:15PM

## 2013-06-22 NOTE — Telephone Encounter (Signed)
I sent in the metolazone. 3 days. Has Tues appt with me. At that appt, I will discuss prn diuretic. Thanks!

## 2013-06-22 NOTE — Telephone Encounter (Signed)
Looks like 6 lbs between 6/27 and 7/1

## 2013-06-28 ENCOUNTER — Encounter: Payer: Self-pay | Admitting: Internal Medicine

## 2013-06-28 ENCOUNTER — Ambulatory Visit (INDEPENDENT_AMBULATORY_CARE_PROVIDER_SITE_OTHER): Payer: Medicare Other | Admitting: Internal Medicine

## 2013-06-28 VITALS — BP 161/86 | HR 70 | Temp 97.2°F | Ht 66.0 in | Wt 292.3 lb

## 2013-06-28 DIAGNOSIS — Z0000 Encounter for general adult medical examination without abnormal findings: Secondary | ICD-10-CM

## 2013-06-28 DIAGNOSIS — M86671 Other chronic osteomyelitis, right ankle and foot: Secondary | ICD-10-CM

## 2013-06-28 DIAGNOSIS — G894 Chronic pain syndrome: Secondary | ICD-10-CM

## 2013-06-28 DIAGNOSIS — I330 Acute and subacute infective endocarditis: Secondary | ICD-10-CM

## 2013-06-28 DIAGNOSIS — E1159 Type 2 diabetes mellitus with other circulatory complications: Secondary | ICD-10-CM

## 2013-06-28 DIAGNOSIS — R269 Unspecified abnormalities of gait and mobility: Secondary | ICD-10-CM

## 2013-06-28 DIAGNOSIS — E785 Hyperlipidemia, unspecified: Secondary | ICD-10-CM

## 2013-06-28 DIAGNOSIS — M86679 Other chronic osteomyelitis, unspecified ankle and foot: Secondary | ICD-10-CM

## 2013-06-28 DIAGNOSIS — I10 Essential (primary) hypertension: Secondary | ICD-10-CM

## 2013-06-28 DIAGNOSIS — I5032 Chronic diastolic (congestive) heart failure: Secondary | ICD-10-CM

## 2013-06-28 LAB — LIPID PANEL
Cholesterol: 142 mg/dL (ref 0–200)
HDL: 35 mg/dL — ABNORMAL LOW (ref >39)
LDL Cholesterol: 85 mg/dL (ref 0–99)
Total CHOL/HDL Ratio: 4.1 Ratio
Triglycerides: 111 mg/dL (ref <150)
VLDL: 22 mg/dL (ref 0–40)

## 2013-06-28 LAB — MAGNESIUM: Magnesium: 1.7 mg/dL (ref 1.5–2.5)

## 2013-06-28 LAB — BASIC METABOLIC PANEL WITH GFR
BUN: 14 mg/dL (ref 6–23)
CO2: 30 mEq/L (ref 19–32)
Calcium: 9.4 mg/dL (ref 8.4–10.5)
Chloride: 100 mEq/L (ref 96–112)
Creat: 0.82 mg/dL (ref 0.50–1.10)
GFR, Est African American: >89 mL/min
GFR, Est Non African American: 81 mL/min
Glucose, Bld: 111 mg/dL — ABNORMAL HIGH (ref 70–99)
Potassium: 4.2 mEq/L (ref 3.5–5.3)
Sodium: 140 mEq/L (ref 135–145)

## 2013-06-28 LAB — GLUCOSE, CAPILLARY: Glucose-Capillary: 104 mg/dL — ABNORMAL HIGH (ref 70–99)

## 2013-06-28 MED ORDER — OXYCODONE-ACETAMINOPHEN 5-325 MG PO TABS: 2.0000 | ORAL_TABLET | Freq: Four times a day (QID) | ORAL | Status: DC | PRN

## 2013-06-28 NOTE — Patient Instructions (Signed)
1. See me in one or two months. 2. I will send you your blood test results 3. If blood OK, I will order an MRI of your back 4. Weight yourself daily. If you gain more than 3 pounds in one day or 5 pounds in one week, please call me and we will start another 3 days of the extra fluid pill. Your goal weight seems to be about 284 lbs on our scales.  5. I will talk to Marylene Land to see if there is any way to get you talking scales. 6. Stop smoking!!

## 2013-06-29 ENCOUNTER — Encounter: Payer: Self-pay | Admitting: Internal Medicine

## 2013-06-29 MED ORDER — INSULIN LISPRO PROT & LISPRO (75-25 MIX) 100 UNIT/ML KWIKPEN: PEN_INJECTOR | SUBCUTANEOUS | Status: DC

## 2013-06-29 NOTE — Assessment & Plan Note (Signed)
This is her biggest complaint today. She states that she cannot stand more than 10 minutes. Feels like something is pulling in her back. Leaning over at the sink makes the pain better. No radiation. No weakness. No pain in AM. Uses shower chair to shower as cannot stand that long. Cannot walk to [employer]. Uses the oxycodone 1 pill q 3 hours - states does wake up at night to take. I was unable to find any back imaging in [facility]. The relief with leaning over points to spinal stenosis. Since Creatinine OK, will start with MRI to assess. Would be steroid injectio candidate but I doubt a surgical candidate.

## 2013-06-29 NOTE — Progress Notes (Signed)
  Subjective:    [name], female    DOB: 1961-11-11, 52 y.o.   MRN: [id number]  Back Pain Associated symptoms include weakness. Pertinent negatives include no chest pain.  Diabetes Associated symptoms include weakness. Pertinent negatives for diabetes include no chest pain.    Please see the A&P for the status of the pt's chronic medical problems.   Review of Systems  Constitutional: Positive for activity change. Negative for appetite change and unexpected weight change.  Eyes: Positive for visual disturbance.  Respiratory: Negative for shortness of breath.   Cardiovascular: Positive for leg swelling. Negative for chest pain.  Musculoskeletal: Positive for myalgias, back pain and gait problem.  Skin: Positive for color change and wound.  Neurological: Positive for weakness.  Psychiatric/Behavioral: Positive for sleep disturbance.       Objective:   Physical Exam  Constitutional: She is oriented to person, place, and time. She appears well-developed and well-nourished. No distress.  HENT:  Head: Normocephalic and atraumatic.  Right Ear: External ear normal.  Left Ear: External ear normal.  Nose: Nose normal.  Eyes: Conjunctivae and EOM are normal.  Pulmonary/Chest: Effort normal and breath sounds normal.  Musculoskeletal: Normal range of motion. She exhibits edema and tenderness.  Neurological: She is alert and oriented to person, place, and time.  Skin: Skin is warm and dry. She is not diaphoretic. There is erythema.  LE B + CVI changes  Psychiatric: She has a normal mood and affect. Her behavior is normal. Judgment and thought content normal.          Assessment & Plan:

## 2013-06-29 NOTE — Assessment & Plan Note (Signed)
BP elevated today but has been OK in past. Will be giving three days of metolazone which will also lower. Other anti-HTN meds inc lasix, atenolol, and benazepril. If remains elevated, and I do not alter her diuretics, will need additional control.   BP Readings from Last 3 Encounters:  06/28/13 161/86  06/13/13 126/76  06/06/13 150/80    Lab Results  Component Value Date   NA 140 06/28/2013   K 4.2 06/28/2013   CREATININE 0.82 06/28/2013

## 2013-06-29 NOTE — Assessment & Plan Note (Addendum)
Weight is up today from 284 to 293. She does not weigh her self at home bc states cannot see the scale reading. Will see if [facility] is able to provide talking scales as she does need to weight her self daily. For now, cont PRN metolazone (start the 3 day dosing today as she never took it earlier this month when [facility] called about weight increase). May need to go to daily more aggressive dosing. No pul edema - all peripheral. Renal fxn so far is OK.

## 2013-06-29 NOTE — Assessment & Plan Note (Signed)
Offered retinal camera but pt then stated has her own optho.

## 2013-06-29 NOTE — Assessment & Plan Note (Signed)
Now off ABX.

## 2013-06-29 NOTE — Assessment & Plan Note (Signed)
Only checking CBG about once or twice a day. No meter today. Using insulin 140 units in AM and 85 at PM. She has been set up with [facility] and [name].   Lab Results  Component Value Date   HGBA1C 8.0 05/11/2013   HGBA1C 9.5* 01/26/2013   HGBA1C 9.0 11/26/2012

## 2013-06-29 NOTE — Assessment & Plan Note (Addendum)
Now off ABX. [facility] coming 2 x per week to dress the ulcers. She changes the bandage the other days. Missed Jennifer [name]'s appt yesterday and will resch.

## 2013-06-29 NOTE — Assessment & Plan Note (Addendum)
Fall Screening 05/25/2013 05/30/2013 06/06/2013 06/13/2013 06/28/2013  Falls in the past year? Yes Yes Yes Yes Yes  Number of falls in past year 2 or more 2 or more 2 or more 2 or more 2 or more  Was there an injury with Fall? No Yes No Yes Yes  Risk Factor Category  High Fall Risk High Fall Risk High Fall Risk High Fall Risk High Fall Risk      She denied falls to be today but endorsed when [facility] asked. Likely 2/2 overall deconditioning, obesity, meds, and foot wounds. PT might be indicated as to tx her fall risk and her back pain.

## 2013-06-30 ENCOUNTER — Other Ambulatory Visit: Payer: Self-pay

## 2013-07-05 ENCOUNTER — Telehealth: Payer: Self-pay | Admitting: *Deleted

## 2013-07-05 NOTE — Telephone Encounter (Signed)
Consuella Lose with [facility] [phone] left a message on triage ID recording 07/04/13 PM - had a home visit sch 07/04/13 - no show. Has another appt sch this week with [facility] nurse. Talked with Consuella Lose 07/05/13- aware I got her message 07/04/13. Stanton Kidney Jakayla Schweppe RN 07/05/13 1:50PM

## 2013-07-06 ENCOUNTER — Encounter (HOSPITAL_COMMUNITY): Payer: Self-pay | Admitting: Emergency Medicine

## 2013-07-06 ENCOUNTER — Other Ambulatory Visit: Payer: Self-pay

## 2013-07-06 ENCOUNTER — Observation Stay (HOSPITAL_COMMUNITY)
Admission: EM | Admit: 2013-07-06 | Discharge: 2013-07-07 | Disposition: A | Discharge status: Home / Self Care | Payer: Medicare Other | Attending: Internal Medicine | Admitting: Internal Medicine

## 2013-07-06 ENCOUNTER — Emergency Department (HOSPITAL_COMMUNITY): Payer: Medicare Other

## 2013-07-06 DIAGNOSIS — E11621 Type 2 diabetes mellitus with foot ulcer: Secondary | ICD-10-CM | POA: Diagnosis present

## 2013-07-06 DIAGNOSIS — I798 Other disorders of arteries, arterioles and capillaries in diseases classified elsewhere: Secondary | ICD-10-CM | POA: Insufficient documentation

## 2013-07-06 DIAGNOSIS — E11319 Type 2 diabetes mellitus with unspecified diabetic retinopathy without macular edema: Secondary | ICD-10-CM | POA: Insufficient documentation

## 2013-07-06 DIAGNOSIS — L97509 Non-pressure chronic ulcer of other part of unspecified foot with unspecified severity: Secondary | ICD-10-CM | POA: Insufficient documentation

## 2013-07-06 DIAGNOSIS — J4489 Other specified chronic obstructive pulmonary disease: Secondary | ICD-10-CM | POA: Insufficient documentation

## 2013-07-06 DIAGNOSIS — F329 Major depressive disorder, single episode, unspecified: Secondary | ICD-10-CM | POA: Insufficient documentation

## 2013-07-06 DIAGNOSIS — I10 Essential (primary) hypertension: Secondary | ICD-10-CM | POA: Diagnosis present

## 2013-07-06 DIAGNOSIS — Z79899 Other long term (current) drug therapy: Secondary | ICD-10-CM | POA: Insufficient documentation

## 2013-07-06 DIAGNOSIS — E1139 Type 2 diabetes mellitus with other diabetic ophthalmic complication: Secondary | ICD-10-CM | POA: Insufficient documentation

## 2013-07-06 DIAGNOSIS — E1151 Type 2 diabetes mellitus with diabetic peripheral angiopathy without gangrene: Secondary | ICD-10-CM | POA: Diagnosis present

## 2013-07-06 DIAGNOSIS — B373 Candidiasis of vulva and vagina: Secondary | ICD-10-CM

## 2013-07-06 DIAGNOSIS — Z794 Long term (current) use of insulin: Secondary | ICD-10-CM | POA: Insufficient documentation

## 2013-07-06 DIAGNOSIS — E1165 Type 2 diabetes mellitus with hyperglycemia: Secondary | ICD-10-CM | POA: Diagnosis present

## 2013-07-06 DIAGNOSIS — IMO0002 Reserved for concepts with insufficient information to code with codable children: Secondary | ICD-10-CM | POA: Insufficient documentation

## 2013-07-06 DIAGNOSIS — J441 Chronic obstructive pulmonary disease with (acute) exacerbation: Secondary | ICD-10-CM | POA: Diagnosis present

## 2013-07-06 DIAGNOSIS — R739 Hyperglycemia, unspecified: Secondary | ICD-10-CM

## 2013-07-06 DIAGNOSIS — F17200 Nicotine dependence, unspecified, uncomplicated: Secondary | ICD-10-CM | POA: Insufficient documentation

## 2013-07-06 DIAGNOSIS — E119 Type 2 diabetes mellitus without complications: Secondary | ICD-10-CM | POA: Diagnosis present

## 2013-07-06 DIAGNOSIS — E785 Hyperlipidemia, unspecified: Secondary | ICD-10-CM | POA: Insufficient documentation

## 2013-07-06 DIAGNOSIS — Z91199 Patient's noncompliance with other medical treatment and regimen due to unspecified reason: Secondary | ICD-10-CM | POA: Insufficient documentation

## 2013-07-06 DIAGNOSIS — E1110 Type 2 diabetes mellitus with ketoacidosis without coma: Secondary | ICD-10-CM

## 2013-07-06 DIAGNOSIS — E1149 Type 2 diabetes mellitus with other diabetic neurological complication: Secondary | ICD-10-CM | POA: Insufficient documentation

## 2013-07-06 DIAGNOSIS — I5032 Chronic diastolic (congestive) heart failure: Secondary | ICD-10-CM | POA: Diagnosis present

## 2013-07-06 DIAGNOSIS — J449 Chronic obstructive pulmonary disease, unspecified: Secondary | ICD-10-CM | POA: Insufficient documentation

## 2013-07-06 DIAGNOSIS — IMO0001 Reserved for inherently not codable concepts without codable children: Secondary | ICD-10-CM

## 2013-07-06 DIAGNOSIS — F3289 Other specified depressive episodes: Secondary | ICD-10-CM | POA: Insufficient documentation

## 2013-07-06 DIAGNOSIS — E1142 Type 2 diabetes mellitus with diabetic polyneuropathy: Secondary | ICD-10-CM | POA: Insufficient documentation

## 2013-07-06 DIAGNOSIS — Z720 Tobacco use: Secondary | ICD-10-CM | POA: Diagnosis present

## 2013-07-06 DIAGNOSIS — F1120 Opioid dependence, uncomplicated: Secondary | ICD-10-CM | POA: Diagnosis present

## 2013-07-06 DIAGNOSIS — M62838 Other muscle spasm: Secondary | ICD-10-CM | POA: Insufficient documentation

## 2013-07-06 DIAGNOSIS — E118 Type 2 diabetes mellitus with unspecified complications: Secondary | ICD-10-CM | POA: Diagnosis present

## 2013-07-06 DIAGNOSIS — G894 Chronic pain syndrome: Secondary | ICD-10-CM | POA: Insufficient documentation

## 2013-07-06 DIAGNOSIS — B3731 Acute candidiasis of vulva and vagina: Secondary | ICD-10-CM | POA: Insufficient documentation

## 2013-07-06 DIAGNOSIS — Z9119 Patient's noncompliance with other medical treatment and regimen: Secondary | ICD-10-CM | POA: Insufficient documentation

## 2013-07-06 DIAGNOSIS — Z79891 Long term (current) use of opiate analgesic: Secondary | ICD-10-CM | POA: Diagnosis present

## 2013-07-06 DIAGNOSIS — E1159 Type 2 diabetes mellitus with other circulatory complications: Principal | ICD-10-CM | POA: Insufficient documentation

## 2013-07-06 DIAGNOSIS — I509 Heart failure, unspecified: Secondary | ICD-10-CM | POA: Insufficient documentation

## 2013-07-06 LAB — POCT I-STAT, CHEM 8
BUN: 18 mg/dL (ref 6–23)
Calcium, Ion: 1.22 mmol/L (ref 1.12–1.23)
Chloride: 95 mEq/L — ABNORMAL LOW (ref 96–112)
Creatinine, Ser: 1.3 mg/dL — ABNORMAL HIGH (ref 0.50–1.10)
Glucose, Bld: 496 mg/dL — ABNORMAL HIGH (ref 70–99)
HCT: 45 % (ref 36.0–46.0)
Hemoglobin: 15.3 g/dL — ABNORMAL HIGH (ref 12.0–15.0)
Potassium: 4.1 mEq/L (ref 3.5–5.1)
Sodium: 136 mEq/L (ref 135–145)
TCO2: 28 mmol/L (ref 0–100)

## 2013-07-06 LAB — CBC WITH DIFFERENTIAL/PLATELET
Basophils Absolute: 0.1 10*3/uL (ref 0.0–0.1)
Basophils Relative: 1 % (ref 0–1)
Eosinophils Absolute: 0.1 10*3/uL (ref 0.0–0.7)
Eosinophils Relative: 1 % (ref 0–5)
HCT: 40.2 % (ref 36.0–46.0)
Hemoglobin: 14.2 g/dL (ref 12.0–15.0)
Lymphocytes Relative: 55 % — ABNORMAL HIGH (ref 12–46)
Lymphs Abs: 5 10*3/uL — ABNORMAL HIGH (ref 0.7–4.0)
MCH: 31.6 pg (ref 26.0–34.0)
MCHC: 35.3 g/dL (ref 30.0–36.0)
MCV: 89.3 fL (ref 78.0–100.0)
Monocytes Absolute: 0.4 10*3/uL (ref 0.1–1.0)
Monocytes Relative: 5 % (ref 3–12)
Neutro Abs: 3.5 10*3/uL (ref 1.7–7.7)
Neutrophils Relative %: 39 % — ABNORMAL LOW (ref 43–77)
Platelets: 213 10*3/uL (ref 150–400)
RBC: 4.5 MIL/uL (ref 3.87–5.11)
RDW: 15 % (ref 11.5–15.5)
WBC: 9 10*3/uL (ref 4.0–10.5)

## 2013-07-06 LAB — POCT I-STAT TROPONIN I: Troponin i, poc: 0.02 ng/mL (ref 0.00–0.08)

## 2013-07-06 LAB — GLUCOSE, CAPILLARY: Glucose-Capillary: 467 mg/dL — ABNORMAL HIGH (ref 70–99)

## 2013-07-06 MED ORDER — SODIUM CHLORIDE 0.9 % IV BOLUS (SEPSIS)
1000.0000 mL | Freq: Once | INTRAVENOUS | Status: AC
  Administered 2013-07-07: 1000 mL via INTRAVENOUS

## 2013-07-06 NOTE — ED Notes (Signed)
Patient states she checked her sugar, her meter read "extremly high".  Patient states she is having abdominal pain, radiates under her left breast.  Patient states she is thirsty and feels tired.

## 2013-07-07 ENCOUNTER — Encounter (HOSPITAL_COMMUNITY): Payer: Self-pay | Admitting: Cardiology

## 2013-07-07 DIAGNOSIS — R739 Hyperglycemia, unspecified: Secondary | ICD-10-CM | POA: Diagnosis present

## 2013-07-07 DIAGNOSIS — E785 Hyperlipidemia, unspecified: Secondary | ICD-10-CM

## 2013-07-07 DIAGNOSIS — I10 Essential (primary) hypertension: Secondary | ICD-10-CM

## 2013-07-07 DIAGNOSIS — I5032 Chronic diastolic (congestive) heart failure: Secondary | ICD-10-CM

## 2013-07-07 DIAGNOSIS — E1110 Type 2 diabetes mellitus with ketoacidosis without coma: Secondary | ICD-10-CM

## 2013-07-07 LAB — POCT I-STAT 3, VENOUS BLOOD GAS (G3P V)
Acid-Base Excess: 2 mmol/L (ref 0.0–2.0)
Bicarbonate: 29.6 mEq/L — ABNORMAL HIGH (ref 20.0–24.0)
O2 Saturation: 74 %
TCO2: 31 mmol/L (ref 0–100)
pCO2, Ven: 55 mmHg — ABNORMAL HIGH (ref 45.0–50.0)
pH, Ven: 7.339 — ABNORMAL HIGH (ref 7.250–7.300)
pO2, Ven: 43 mmHg (ref 30.0–45.0)

## 2013-07-07 LAB — URINE MICROSCOPIC-ADD ON

## 2013-07-07 LAB — BASIC METABOLIC PANEL
BUN: 21 mg/dL (ref 6–23)
BUN: 22 mg/dL (ref 6–23)
CO2: 21 mEq/L (ref 19–32)
CO2: 26 mEq/L (ref 19–32)
Calcium: 8.9 mg/dL (ref 8.4–10.5)
Calcium: 9.6 mg/dL (ref 8.4–10.5)
Chloride: 98 mEq/L (ref 96–112)
Chloride: 96 mEq/L (ref 96–112)
Creatinine, Ser: 1.4 mg/dL — ABNORMAL HIGH (ref 0.50–1.10)
Creatinine, Ser: 1.24 mg/dL — ABNORMAL HIGH (ref 0.50–1.10)
GFR calc Af Amer: 48 mL/min — ABNORMAL LOW (ref >90)
GFR calc Af Amer: 56 mL/min — ABNORMAL LOW (ref >90)
GFR calc non Af Amer: 42 mL/min — ABNORMAL LOW (ref >90)
GFR calc non Af Amer: 48 mL/min — ABNORMAL LOW (ref >90)
Glucose, Bld: 140 mg/dL — ABNORMAL HIGH (ref 70–99)
Glucose, Bld: 205 mg/dL — ABNORMAL HIGH (ref 70–99)
Potassium: 5.3 mEq/L — ABNORMAL HIGH (ref 3.5–5.1)
Potassium: 4.6 mEq/L (ref 3.5–5.1)
Sodium: 135 mEq/L (ref 135–145)
Sodium: 135 mEq/L (ref 135–145)

## 2013-07-07 LAB — CBC
HCT: 42.2 % (ref 36.0–46.0)
Hemoglobin: 14.2 g/dL (ref 12.0–15.0)
MCH: 30.4 pg (ref 26.0–34.0)
MCHC: 33.6 g/dL (ref 30.0–36.0)
MCV: 90.4 fL (ref 78.0–100.0)
Platelets: UNDETERMINED 10*3/uL (ref 150–400)
RBC: 4.67 MIL/uL (ref 3.87–5.11)
RDW: 15.7 % — ABNORMAL HIGH (ref 11.5–15.5)
WBC: 9.5 10*3/uL (ref 4.0–10.5)

## 2013-07-07 LAB — COMPREHENSIVE METABOLIC PANEL
ALT: 63 U/L — ABNORMAL HIGH (ref 0–35)
AST: 56 U/L — ABNORMAL HIGH (ref 0–37)
Albumin: 3.5 g/dL (ref 3.5–5.2)
Alkaline Phosphatase: 117 U/L (ref 39–117)
BUN: 17 mg/dL (ref 6–23)
CO2: 31 mEq/L (ref 19–32)
Calcium: 10.2 mg/dL (ref 8.4–10.5)
Chloride: 92 mEq/L — ABNORMAL LOW (ref 96–112)
Creatinine, Ser: 1.09 mg/dL (ref 0.50–1.10)
GFR calc Af Amer: 65 mL/min — ABNORMAL LOW (ref >90)
GFR calc non Af Amer: 56 mL/min — ABNORMAL LOW (ref >90)
Glucose, Bld: 489 mg/dL — ABNORMAL HIGH (ref 70–99)
Potassium: 4.1 mEq/L (ref 3.5–5.1)
Sodium: 132 mEq/L — ABNORMAL LOW (ref 135–145)
Total Bilirubin: 0.3 mg/dL (ref 0.3–1.2)
Total Protein: 8 g/dL (ref 6.0–8.3)

## 2013-07-07 LAB — GLUCOSE, CAPILLARY
Glucose-Capillary: 262 mg/dL — ABNORMAL HIGH (ref 70–99)
Glucose-Capillary: 242 mg/dL — ABNORMAL HIGH (ref 70–99)
Glucose-Capillary: 221 mg/dL — ABNORMAL HIGH (ref 70–99)
Glucose-Capillary: 181 mg/dL — ABNORMAL HIGH (ref 70–99)
Glucose-Capillary: 167 mg/dL — ABNORMAL HIGH (ref 70–99)
Glucose-Capillary: 168 mg/dL — ABNORMAL HIGH (ref 70–99)
Glucose-Capillary: 183 mg/dL — ABNORMAL HIGH (ref 70–99)
Glucose-Capillary: 261 mg/dL — ABNORMAL HIGH (ref 70–99)

## 2013-07-07 LAB — URINALYSIS, ROUTINE W REFLEX MICROSCOPIC
Bilirubin Urine: NEGATIVE
Glucose, UA: >1000 mg/dL — AB
Ketones, ur: NEGATIVE mg/dL
Nitrite: NEGATIVE
Protein, ur: NEGATIVE mg/dL
Specific Gravity, Urine: 1.025 (ref 1.005–1.030)
Urobilinogen, UA: 0.2 mg/dL (ref 0.0–1.0)
pH: 5.5 (ref 5.0–8.0)

## 2013-07-07 LAB — PRO B NATRIURETIC PEPTIDE: Pro B Natriuretic peptide (BNP): 60.7 pg/mL (ref 0–125)

## 2013-07-07 LAB — TROPONIN I
Troponin I: <0.3 ng/mL (ref <0.30)
Troponin I: <0.3 ng/mL (ref <0.30)

## 2013-07-07 LAB — MRSA PCR SCREENING: MRSA by PCR: NEGATIVE

## 2013-07-07 MED ORDER — PREDNISONE 20 MG PO TABS: 60.0000 mg | ORAL_TABLET | Freq: Once | ORAL | Status: DC

## 2013-07-07 MED ORDER — IPRATROPIUM BROMIDE 0.02 % IN SOLN: 0.5000 mg | Freq: Four times a day (QID) | RESPIRATORY_TRACT | Status: DC | PRN

## 2013-07-07 MED ORDER — METRONIDAZOLE IN NACL 5-0.79 MG/ML-% IV SOLN: 500.0000 mg | Freq: Once | INTRAVENOUS | Status: DC

## 2013-07-07 MED ORDER — SIMVASTATIN 40 MG PO TABS
40.0000 mg | ORAL_TABLET | Freq: Every day | ORAL | Status: DC
  Administered 2013-07-07: 40 mg via ORAL
  Filled 2013-07-07: qty 1

## 2013-07-07 MED ORDER — MOMETASONE FURO-FORMOTEROL FUM 100-5 MCG/ACT IN AERO
2.0000 | INHALATION_SPRAY | Freq: Two times a day (BID) | RESPIRATORY_TRACT | Status: DC
  Administered 2013-07-07: 2 via RESPIRATORY_TRACT
  Filled 2013-07-07: qty 8.8

## 2013-07-07 MED ORDER — SODIUM CHLORIDE 0.9 % IV SOLN: INTRAVENOUS | Status: DC

## 2013-07-07 MED ORDER — INSULIN ASPART PROT & ASPART (70-30 MIX) 100 UNIT/ML ~~LOC~~ SUSP
140.0000 [IU] | Freq: Every day | SUBCUTANEOUS | Status: DC
  Filled 2013-07-07: qty 10

## 2013-07-07 MED ORDER — HEPARIN SODIUM (PORCINE) 5000 UNIT/ML IJ SOLN
5000.0000 [IU] | Freq: Three times a day (TID) | INTRAMUSCULAR | Status: DC
  Administered 2013-07-07: 5000 [IU] via SUBCUTANEOUS
  Filled 2013-07-07 (×4): qty 1

## 2013-07-07 MED ORDER — POTASSIUM CHLORIDE 10 MEQ/100ML IV SOLN
10.0000 meq | INTRAVENOUS | Status: AC
  Administered 2013-07-07 (×2): 10 meq via INTRAVENOUS
  Filled 2013-07-07 (×2): qty 100

## 2013-07-07 MED ORDER — DEXTROSE-NACL 5-0.45 % IV SOLN
INTRAVENOUS | Status: DC
  Administered 2013-07-07: 100 mL/h via INTRAVENOUS

## 2013-07-07 MED ORDER — INSULIN ASPART PROT & ASPART (70-30 MIX) 100 UNIT/ML ~~LOC~~ SUSP
85.0000 [IU] | Freq: Every day | SUBCUTANEOUS | Status: DC
  Filled 2013-07-07: qty 10

## 2013-07-07 MED ORDER — SODIUM CHLORIDE 0.9 % IV SOLN
INTRAVENOUS | Status: DC
  Filled 2013-07-07: qty 1

## 2013-07-07 MED ORDER — OXYCODONE-ACETAMINOPHEN 5-325 MG PO TABS
1.0000 | ORAL_TABLET | Freq: Four times a day (QID) | ORAL | Status: DC | PRN
  Administered 2013-07-07: 1 via ORAL
  Filled 2013-07-07: qty 1

## 2013-07-07 MED ORDER — INSULIN ASPART PROT & ASPART (70-30 MIX) 100 UNIT/ML ~~LOC~~ SUSP
140.0000 [IU] | Freq: Every day | SUBCUTANEOUS | Status: DC
Start: 2013-07-07 — End: 2013-07-07
  Administered 2013-07-07: 140 [IU] via SUBCUTANEOUS

## 2013-07-07 MED ORDER — ALBUTEROL SULFATE (5 MG/ML) 0.5% IN NEBU: 2.5000 mg | INHALATION_SOLUTION | Freq: Four times a day (QID) | RESPIRATORY_TRACT | Status: DC | PRN

## 2013-07-07 MED ORDER — DEXTROSE-NACL 5-0.45 % IV SOLN: INTRAVENOUS | Status: DC

## 2013-07-07 MED ORDER — SILVER SULFADIAZINE 1 % EX CREA
TOPICAL_CREAM | Freq: Every day | CUTANEOUS | Status: DC
  Administered 2013-07-07: 14:00:00 via TOPICAL
  Filled 2013-07-07: qty 85

## 2013-07-07 MED ORDER — PREGABALIN 50 MG PO CAPS
200.0000 mg | ORAL_CAPSULE | Freq: Once | ORAL | Status: AC
  Administered 2013-07-07: 200 mg via ORAL
  Filled 2013-07-07: qty 4

## 2013-07-07 MED ORDER — BACLOFEN 10 MG PO TABS: 10.0000 mg | ORAL_TABLET | Freq: Three times a day (TID) | ORAL | Status: DC

## 2013-07-07 MED ORDER — ASPIRIN EC 81 MG PO TBEC
81.0000 mg | DELAYED_RELEASE_TABLET | Freq: Every morning | ORAL | Status: DC
  Administered 2013-07-07: 81 mg via ORAL
  Filled 2013-07-07: qty 1

## 2013-07-07 MED ORDER — FLUCONAZOLE 150 MG PO TABS: 150.0000 mg | ORAL_TABLET | ORAL | Status: DC

## 2013-07-07 MED ORDER — BUPROPION HCL ER (SR) 150 MG PO TB12
150.0000 mg | ORAL_TABLET | Freq: Two times a day (BID) | ORAL | Status: DC
  Administered 2013-07-07: 150 mg via ORAL
  Filled 2013-07-07 (×2): qty 1

## 2013-07-07 MED ORDER — PANTOPRAZOLE SODIUM 40 MG PO TBEC
40.0000 mg | DELAYED_RELEASE_TABLET | Freq: Every day | ORAL | Status: DC
  Administered 2013-07-07: 40 mg via ORAL
  Filled 2013-07-07: qty 1

## 2013-07-07 MED ORDER — SODIUM CHLORIDE 0.9 % IV SOLN
INTRAVENOUS | Status: DC
  Administered 2013-07-07: 2 [IU]/h via INTRAVENOUS
  Filled 2013-07-07: qty 1

## 2013-07-07 MED ORDER — OXYCODONE-ACETAMINOPHEN 5-325 MG PO TABS
2.0000 | ORAL_TABLET | Freq: Four times a day (QID) | ORAL | Status: DC | PRN
  Administered 2013-07-07: 2 via ORAL
  Filled 2013-07-07: qty 2

## 2013-07-07 MED ORDER — INSULIN ASPART PROT & ASPART (70-30 MIX) 100 UNIT/ML ~~LOC~~ SUSP
85.0000 [IU] | Freq: Every day | SUBCUTANEOUS | Status: DC
  Administered 2013-07-07: 85 [IU] via SUBCUTANEOUS

## 2013-07-07 MED ORDER — ALBUTEROL SULFATE HFA 108 (90 BASE) MCG/ACT IN AERS
2.0000 | INHALATION_SPRAY | Freq: Four times a day (QID) | RESPIRATORY_TRACT | Status: DC | PRN
  Administered 2013-07-07: 2 via RESPIRATORY_TRACT
  Filled 2013-07-07: qty 6.7

## 2013-07-07 MED ORDER — FLUCONAZOLE 150 MG PO TABS
150.0000 mg | ORAL_TABLET | ORAL | Status: DC
  Administered 2013-07-07: 150 mg via ORAL
  Filled 2013-07-07: qty 1

## 2013-07-07 MED ORDER — CIPROFLOXACIN IN D5W 400 MG/200ML IV SOLN
400.0000 mg | Freq: Once | INTRAVENOUS | Status: DC
  Filled 2013-07-07: qty 200

## 2013-07-07 MED ORDER — SODIUM CHLORIDE 0.9 % IV BOLUS (SEPSIS)
1000.0000 mL | Freq: Once | INTRAVENOUS | Status: AC
  Administered 2013-07-07: 1000 mL via INTRAVENOUS

## 2013-07-07 MED ORDER — SODIUM CHLORIDE 0.9 % IV SOLN
INTRAVENOUS | Status: AC
  Administered 2013-07-07: 999 mL/h via INTRAVENOUS

## 2013-07-07 MED ORDER — DEXTROSE 50 % IV SOLN: 25.0000 mL | INTRAVENOUS | Status: DC | PRN

## 2013-07-07 NOTE — ED Provider Notes (Signed)
History    CSN: [id number] Arrival date & time 07/06/13  2250  First MD Initiated Contact with Patient 07/06/13 2325     Chief Complaint  Patient presents with  . Hyperglycemia   (Consider location/radiation/quality/duration/timing/severity/associated sxs/prior Treatment) HPI Comments: 52 y.o. female with history of depression, PVD, COPD, HTN, HLD, T2DM, chronic diastolic HF presenting to the ED with cc of elevated sugar. Pt was having a pretty ordinary day, until 9 pm. She started getting muscle spasms diffusely, and some left sided abd pain. She checked her sugar, and it read high - so she came to the ER. She has some nausea, but denies emesis, fevers, chills, chest pains, NEW shortness of breath or cough, headaches, uti like symptoms. Pt has been taking her meds as prescribed   Patient is a 52 y.o. female presenting with hyperglycemia. The history is provided by the patient.  Hyperglycemia Associated symptoms: abdominal pain, fatigue and shortness of breath   Associated symptoms: no chest pain, no confusion, no fever, no nausea and no vomiting    Past Medical History  Diagnosis Date  . Depression   . GERD (gastroesophageal reflux disease)   . Hyperlipidemia   . Hypertension   . Glaucoma   . PVD (peripheral vascular disease) with claudication     Stents to bilateral common iliac arteries (left 2005, right 2008), on chronic plavix  . Tinnitus   . Diabetic peripheral neuropathy   . Fibromyalgia   . Hyperplastic colon polyp 12/2010    Per colonoscopy (12/2010) - Dr. Arlyce Dice  . Diverticulosis   . Asthma   . COPD 01/08/2007    PFT's 05/2007 : FEV1/FVC 82, FEV1 64% pred, FEF 25-75% 40% predicted, 16% improvement in FEV1 with bronchodilators.     . Type II diabetes mellitus with peripheral circulatory disorders, uncontrolled (250.72) DX: 1993    Insulin dep. Poor control. Complicated by diabetic foot ulcer and diabetic eye disease.    . Chronic pain syndrome 12/03/2011    Likely  secondary to depression, "fibromyalgia", neuropathy, and obesity.    . Tobacco abuse   . Chronic osteomyelitis of foot     chronic, right secondary to diabetic foot ulcers  . Polymicrobial bacterial infection 01/2013    GBS and S. aureus bacteremia // Source likely infected diabetic foot ulcer  . 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 Blood cultures 01/26/13 S. Aureus and GBS. Blood cultures Feb 6th, 8th, and 9th and March were negative.Repeat TEE 3/20 negative for vegitations  . 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 Chronic diastolic heart failure     grade 2 per 2D echocardiogram (01/2013)  . Ulcer of foot, chronic     Left. No OM per MRI (01/2013)  . DVT of upper extremity (deep vein thrombosis) 03/11/2013    Secondary to PICC line. Right brachial vein, diagnosed on 03/10/2013 Coumadin for 3 months. End date 06/10/2013    Past Surgical History  Procedure Laterality Date  . Abdominal hysterectomy  1997    secondary to uterine fibroids  . Tubal ligation    . Rotator cuff repair      bilateral  . Breast biopsy      multiple-benign per pt  . Wrist surgery      right  . Ganglion cyst excision      multiple  . Other surgical history      stents in lower ext  . Rhae Hammock  without cardioversion N/A 01/31/2013    Procedure: TRANSESOPHAGEAL ECHOCARDIOGRAM (TEE);  Surgeon: Pricilla Riffle, MD;  Location: [facility] ENDOSCOPY;  Service: Cardiovascular;  Laterality: N/A;  Rm [phone]  . Bladder surgery      bladder reconstruction surgery  . Tee without cardioversion N/A 03/10/2013    Procedure: TRANSESOPHAGEAL ECHOCARDIOGRAM (TEE);  Surgeon: Laurey Morale, MD;  Location: [facility] ENDOSCOPY;  Service: Cardiovascular;  Laterality: N/A;  Rm. [phone]  . Skin grafts Bilateral 05/13/2013    Dr Lajoyce Corners  . Skin split graft Bilateral 05/13/2013    Procedure: Right and Left Foot Allograft Skin Graft;  Surgeon: Nadara Mustard, MD;  Location: [facility] OR;  Service:  Orthopedics;  Laterality: Bilateral;  Right and Left Foot Allograft Skin Graft   Family History  Problem Relation Age of Onset  . Diverticulosis Mother   . Diabetes Mother   . Hypertension Mother   . Congestive Heart Failure Mother   . Asthma Father    History  Substance Use Topics  . Smoking status: Current Every Day Smoker -- 0.50 packs/day for 40 years    Types: Cigarettes  . Smokeless tobacco: Never Used     Comment: Half pack or less. Smokes anpack per week.      '  I already have information on quitting "  . Alcohol Use: No   OB History   Grav Para Term Preterm Abortions TAB SAB Ect Mult Living                 Review of Systems  Constitutional: Positive for fatigue. Negative for fever and activity change.  HENT: Negative for facial swelling and neck pain.   Respiratory: Positive for cough and shortness of breath. Negative for wheezing.   Cardiovascular: Positive for leg swelling. Negative for chest pain.  Gastrointestinal: Positive for abdominal pain. Negative for nausea, vomiting, diarrhea, constipation, blood in stool and abdominal distention.  Genitourinary: Negative for hematuria and difficulty urinating.  Musculoskeletal: Positive for myalgias.  Skin: Negative for color change.  Neurological: Negative for speech difficulty.  Hematological: Does not bruise/bleed easily.  Psychiatric/Behavioral: Negative for confusion.    Allergies  Iohexol; Ivp dye; and Morphine sulfate  Home Medications   Current Outpatient Rx  Name  Route  Sig  Dispense  Refill  . albuterol (PROAIR HFA) 108 (90 BASE) MCG/ACT inhaler   Inhalation   Inhale 2 puffs into the lungs every 6 (six) hours as needed for wheezing.   3 Inhaler   1   . albuterol (PROVENTIL) (5 MG/ML) 0.5% nebulizer solution   Nebulization   Take 0.5 mLs (2.5 mg total) by nebulization every 6 (six) hours as needed for wheezing or shortness of breath.   20 mL   5   . aspirin EC 81 MG tablet   Oral   Take 1  tablet (81 mg total) by mouth every morning.   90 tablet   1   . atenolol (TENORMIN) 100 MG tablet   Oral   Take 1 tablet (100 mg total) by mouth every morning.   90 tablet   1   . benazepril (LOTENSIN) 40 MG tablet   Oral   Take 40 mg by mouth daily.         Marland Kitchen buPROPion (WELLBUTRIN SR) 150 MG 12 hr tablet   Oral   Take 1 tablet (150 mg total) by mouth 2 (two) times daily.   180 tablet   1   . diphenhydrAMINE (BENADRYL) 25 MG tablet  Oral   Take 25 mg by mouth every 8 (eight) hours.         Marland Kitchen esomeprazole (NEXIUM) 20 MG capsule   Oral   Take 1 capsule (20 mg total) by mouth daily before breakfast.   90 capsule   1   . fluticasone (FLONASE) 50 MCG/ACT nasal spray   Nasal   Place 2 sprays into the nose 2 (two) times daily as needed for allergies.   48 g   1   . Fluticasone-Salmeterol (ADVAIR DISKUS) 250-50 MCG/DOSE AEPB   Inhalation   Inhale 1 puff into the lungs 2 (two) times daily.   180 each   1   . furosemide (LASIX) 40 MG tablet   Oral   Take 40 mg by mouth 2 (two) times daily. Take 1 tablet in the morning and 1 in the afternoon         . Insulin Lispro Prot & Lispro (HUMALOG MIX 75/25 KWIKPEN) (75-25) 100 UNIT/ML SUPN      140 units in the morning and 85 units in the evening   195 mL   1   . ipratropium (ATROVENT) 0.02 % nebulizer solution   Nebulization   Take 2.5 mLs (0.5 mg total) by nebulization every 6 (six) hours as needed for wheezing.   75 mL   1   . metFORMIN (GLUCOPHAGE) 1000 MG tablet   Oral   Take 1 tablet (1,000 mg total) by mouth 2 (two) times daily.   180 tablet   1   . metolazone (ZAROXOLYN) 5 MG tablet   Oral   Take 5 mg by mouth daily.         . nicotine (NICOTROL) 10 MG inhaler   Inhalation   Inhale 1 puff into the lungs as needed for smoking cessation. 6 to 16 cartridges per day for 6 to 12 wks. Than contact MD for instructions   42 each   1   . oxyCODONE-acetaminophen (ROXICET) 5-325 MG per tablet   Oral    Take 2 tablets by mouth every 6 (six) hours as needed for pain.   240 tablet   0   . pravastatin (PRAVACHOL) 40 MG tablet   Oral   Take 1 tablet (40 mg total) by mouth every morning.   90 tablet   1   . pregabalin (LYRICA) 200 MG capsule   Oral   Take 1 capsule (200 mg total) by mouth 3 (three) times daily.   90 capsule   1   . promethazine (PHENERGAN) 12.5 MG tablet   Oral   Take 1 tablet (12.5 mg total) by mouth every 6 (six) hours as needed for nausea.   20 tablet   0   . silver sulfADIAZINE (SILVADENE) 1 % cream   Topical   Apply 1 application topically daily.          . traZODone (DESYREL) 100 MG tablet   Oral   Take 1 tablet (100 mg total) by mouth at bedtime.   90 tablet   1   . glucose blood (ONETOUCH VERIO) test strip      Check blood sugar up to 4 times a day before meals and bedtime Dx code- 250.72 insulin-requiring   150 each   12   . glucose blood test strip      Check blood sugar up to 4 times a day before meals and bedtime Dx code- 250.72, insulin requiring   150 each   12   .  ONETOUCH DELICA LANCETS FINE MISC   Does not apply   1 each by Does not apply route 2 (two) times daily. Check blood sugar up to 4 times a day before meals and bedtime Dx code- 250.72, insulin requiring From 03/03/2013   100 each   12    BP 157/122  Pulse 80  Temp(Src) 98 F (36.7 C) (Oral)  Resp 20  SpO2 94% Physical Exam  Nursing note and vitals reviewed. Constitutional: She is oriented to person, place, and time. She appears well-developed and well-nourished.  HENT:  Head: Normocephalic and atraumatic.  Eyes: EOM are normal. Pupils are equal, round, and reactive to light.  Neck: Neck supple.  Cardiovascular: Normal rate and regular rhythm.   Murmur heard. Pulmonary/Chest: Effort normal. No respiratory distress.  Abdominal: Soft. She exhibits no distension. There is tenderness. There is no rebound and no guarding.  LLQ abd tenderness  Neurological: She is  alert and oriented to person, place, and time.  Skin: Skin is warm and dry.    ED Course  Procedures (including critical care time) Labs Reviewed  CBC WITH DIFFERENTIAL - Abnormal; Notable for the following:    Neutrophils Relative % 39 (*)    Lymphocytes Relative 55 (*)    Lymphs Abs 5.0 (*)    All other components within normal limits  COMPREHENSIVE METABOLIC PANEL - Abnormal; Notable for the following:    Sodium 132 (*)    Chloride 92 (*)    Glucose, Bld 489 (*)    AST 56 (*)    ALT 63 (*)    GFR calc non Af Amer 56 (*)    GFR calc Af Amer 65 (*)    All other components within normal limits  GLUCOSE, CAPILLARY - Abnormal; Notable for the following:    Glucose-Capillary 467 (*)    All other components within normal limits  POCT I-STAT, CHEM 8 - Abnormal; Notable for the following:    Chloride 95 (*)    Creatinine, Ser 1.30 (*)    Glucose, Bld 496 (*)    Hemoglobin 15.3 (*)    All other components within normal limits  POCT I-STAT TROPONIN I   Dg Chest Portable 1 View  07/06/2013   *RADIOLOGY REPORT*  Clinical Data: Hyperglycemia.  PORTABLE CHEST - 1 VIEW  Comparison: Chest radiograph performed 04/30/2013  Findings: The lungs are well-aerated.  Mild peribronchial thickening is noted.  There is no evidence of focal opacification, pleural effusion or pneumothorax.  There is mild elevation of the left hemidiaphragm.  The cardiomediastinal silhouette is within normal limits.  No acute osseous abnormalities are seen.  IMPRESSION: Mild peribronchial thickening noted; lungs otherwise grossly clear.   Original Report Authenticated By: Tonia Ghent, M.D.   No diagnosis found.  MDM   Date: 07/07/2013  Rate: 79  Rhythm: normal sinus rhythm  QRS Axis: normal  Intervals: normal  ST/T Wave abnormalities: normal  Conduction Disutrbances: none  Narrative Interpretation: unremarkable  Pt comes in with cc of weakness, and elevated sugar.  DDx: Sepsis syndrome ACS  syndrome DKA Infection - pneumonia/UTI/Cellulitis Diverticulitis Dehydration Electrolyte abnormality Tox syndrome Renal stones Pyelonephritis  Pt comes in with cc of abd pain, weakness, elevated sugar. Pt is clinically in DKA, with AG of 23. Pt has abd pain. No CT in the recent years, and the pain is LLQ, so concerns for possible diverticulitis. Pt has contrast allergy, so we will prep her, and tx empirically for presumed diverticulitis. Admit to medicine. DKA protocol  initiated.  Derwood Kaplan, MD 07/07/13 [phone]

## 2013-07-07 NOTE — Care Management Note (Signed)
    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 arranged  HH-1 RN      [facility] agency  [facility]   Status of service:   Medicare Important Message given?   (If response is "NO", the following Medicare IM given date fields will be blank) Date Medicare IM given:   Date Additional Medicare IM given:    Discharge Disposition:  HOME W HOME HEALTH SERVICES  Per UR Regulation:  Reviewed for med. necessity/level of care/duration of stay  If discussed at Long Length of Stay Meetings, dates discussed:    Comments:  7/17 1017a debbie Jennifer Pereda rn,bsn alerted donna w [facility] of adm. [facility] will follow alone until dc.

## 2013-07-07 NOTE — Progress Notes (Signed)
Discharge instructions and prescriptions given to patient; understanding verbalized.  IV's removed.  Belongings in reach.  Patient currently awaiting ride.  Plan of care and education resolved.

## 2013-07-07 NOTE — Discharge Instructions (Signed)
Hyperglycemia Hyperglycemia occurs when the glucose (sugar) in your blood is too high. Hyperglycemia can happen for many reasons, but it most often happens to people who do not know they have diabetes or are not managing their diabetes properly.  CAUSES  Whether you have diabetes or not, there are other causes of hyperglycemia. Hyperglycemia can occur when you have diabetes, but it can also occur in other situations that you might not be as aware of, such as: Diabetes  If you have diabetes and are having problems controlling your blood glucose, hyperglycemia could occur because of some of the following reasons:  Not following your meal plan.  Not taking your diabetes medications or not taking it properly.  Exercising less or doing less activity than you normally do.  Being sick. Pre-diabetes  This cannot be ignored. Before people develop Type 2 diabetes, they almost always have "pre-diabetes." This is when your blood glucose levels are higher than normal, but not yet high enough to be diagnosed as diabetes. Research has shown that some long-term damage to the body, especially the heart and circulatory system, may already be occurring during pre-diabetes. If you take action to manage your blood glucose when you have pre-diabetes, you may delay or prevent Type 2 diabetes from developing. Stress  If you have diabetes, you may be "diet" controlled or on oral medications or insulin to control your diabetes. However, you may find that your blood glucose is higher than usual in the hospital whether you have diabetes or not. This is often referred to as "stress hyperglycemia." Stress can elevate your blood glucose. This happens because of hormones put out by the body during times of stress. If stress has been the cause of your high blood glucose, it can be followed regularly by your caregiver. That way he/she can make sure your hyperglycemia does not continue to get worse or progress to  diabetes. Steroids  Steroids are medications that act on the infection fighting system (immune system) to block inflammation or infection. One side effect can be a rise in blood glucose. Most people can produce enough extra insulin to allow for this rise, but for those who cannot, steroids make blood glucose levels go even higher. It is not unusual for steroid treatments to "uncover" diabetes that is developing. It is not always possible to determine if the hyperglycemia will go away after the steroids are stopped. A special blood test called an A1c is sometimes done to determine if your blood glucose was elevated before the steroids were started. SYMPTOMS  Thirsty.  Frequent urination.  Dry mouth.  Blurred vision.  Tired or fatigue.  Weakness.  Sleepy.  Tingling in feet or leg. DIAGNOSIS  Diagnosis is made by monitoring blood glucose in one or all of the following ways:  A1c test. This is a chemical found in your blood.  Fingerstick blood glucose monitoring.  Laboratory results. TREATMENT  First, knowing the cause of the hyperglycemia is important before the hyperglycemia can be treated. Treatment may include, but is not be limited to:  Education.  Change or adjustment in medications.  Change or adjustment in meal plan.  Treatment for an illness, infection, etc.  More frequent blood glucose monitoring.  Change in exercise plan.  Decreasing or stopping steroids.  Lifestyle changes. HOME CARE INSTRUCTIONS   Test your blood glucose as directed.  Exercise regularly. Your caregiver will give you instructions about exercise. Pre-diabetes or diabetes which comes on with stress is helped by exercising.  Eat wholesome,   balanced meals. Eat often and at regular, fixed times. Your caregiver or nutritionist will give you a meal plan to guide your sugar intake.  Being at an ideal weight is important. If needed, losing as little as 10 to 15 pounds may help improve blood  glucose levels. SEEK MEDICAL CARE IF:   You have questions about medicine, activity, or diet.  You continue to have symptoms (problems such as increased thirst, urination, or weight gain). SEEK IMMEDIATE MEDICAL CARE IF:   You are vomiting or have diarrhea.  Your breath smells fruity.  You are breathing faster or slower.  You are very sleepy or incoherent.  You have numbness, tingling, or pain in your feet or hands.  You have chest pain.  Your symptoms get worse even though you have been following your caregiver's orders.  If you have any other questions or concerns. Document Released: 06/03/2001 Document Revised: 03/01/2012 Document Reviewed: 04/05/2012 [facility] Patient Information 2014 [place], [place].  Complementary and Alternative Medical Therapies for Diabetes Complementary and alternative medicines are health care practices or products that are not always accepted as part of routine medicine. Complementary medicine is used along with routine medicine (medical therapy). Alternative medicine can sometimes be used instead of routine medicine. Some people use these methods to treat diabetes. While some of these therapies may be effective, others may not be. Some may even be harmful. Patients using these methods need to tell their caregiver. It is important to let your caregivers know what you are doing. Some of these therapies are discussed below. For more information, talk with your caregiver. THERAPIES Acupuncture Acupuncture is done by a professional who inserts needles into certain points on the skin. Some scientists believe that this triggers the release of the body's natural painkillers. It has been shown to relieve long-term (chronic) pain. This may help patients with painful nerve damage caused by diabetes. Biofeedback Biofeedback helps a person become more aware of the body's response to pain. It also helps you learn to deal with the pain. This alternative therapy focuses  on relaxation and stress-reduction techniques. Thinking of peaceful mental images (guided imagery) is one technique. Some people believe these images can ease their condition. MEDICATIONS Chromium Several studies report that chromium supplements may improve diabetes control. Chromium helps insulin improve its action. Research is not yet certain. Supplements have not been recommended or approved. Caution is needed if you have kidney (renal) problems. Ginseng There are several types of ginseng plants. American ginseng is used for diabetes studies. Those studies have shown some glucose-lowering effects. Those effects have been seen with fasting and after-meal blood glucose levels. They have also been seen in A1c levels (average blood glucose levels over a 48-month period). More long-term studies are needed before recommendations for use of ginseng can be made. Magnesium Experts have studied the relationship between magnesium and diabetes for many years. But it is not yet fully understood. Studies suggest that a low amount of magnesium may make blood glucose control worse in type 2 diabetes. Research also shows that a low amount may contribute to certain diabetes complications. One study showed that people who consume more magnesium had less risk of type 2 diabetes. Eating whole grains, nuts, and green leafy vegetables raises the magnesium level. Vanadium Vanadium is a compound found in tiny amounts in plants and animals. Early studies showed that vanadium improved blood glucose levels in animals with type 1 and type 2 diabetes. One study found that when given vanadium, those with diabetes were  able to decrease their insulin dosage. Researchers still need to learn how it works in the body to discover any side effects, and to find safe dosages. Cinnamon There have been a couple of studies that seem to indicate cinnamon decreases insulin resistance and increases insulin production. By doing so, it may lower  blood glucose. Exact doses are unknown, but it may work best when used in combination with other diabetes medicines. Document Released: 10/05/2007 Document Revised: 03/01/2012 Document Reviewed: 10/18/2009 [facility] Patient Information 2014 [place], [place].  Blood Sugar Monitoring, Adult GLUCOSE METERS FOR SELF-MONITORING OF BLOOD GLUCOSE  It is important to be able to correctly measure your blood sugar (glucose). You can use a blood glucose monitor (a small battery-operated device) to check your glucose level at any time. This allows you and your caregiver to monitor your diabetes and to determine how well your treatment plan is working. The process of monitoring your blood glucose with a glucose meter is called self-monitoring of blood glucose (SMBG). When people with diabetes control their blood sugar, they have better health. To test for glucose with a typical glucose meter, place the disposable strip in the meter. Then place a small sample of blood on the "test strip." The test strip is coated with chemicals that combine with glucose in blood. The meter measures how much glucose is present. The meter displays the glucose level as a number. Several new models can record and store a number of test results. Some models can connect to personal computers to store test results or print them out.  Newer meters are often easier to use than older models. Some meters allow you to get blood from places other than your fingertip. Some new models have automatic timing, error codes, signals, or barcode readers to help with proper adjustment (calibration). Some meters have a large display screen or spoken instructions for people with visual impairments.  INSTRUCTIONS FOR USING GLUCOSE METERS  Wash your hands with soap and warm water, or clean the area with alcohol. Dry your hands completely.  Prick the side of your fingertip with a lancet (a sharp-pointed tool used by hand).  Hold the hand down and gently milk  the finger until a small drop of blood appears. Catch the blood with the test strip.  Follow the instructions for inserting the test strip and using the SMBG meter. Most meters require the meter to be turned on and the test strip to be inserted before applying the blood sample.  Record the test result.  Read the instructions carefully for both the meter and the test strips that go with it. Meter instructions are found in the user manual. Keep this manual to help you solve any problems that may arise. Many meters use "error codes" when there is a problem with the meter, the test strip, or the blood sample on the strip. You will need the manual to understand these error codes and fix the problem.  New devices are available such as laser lancets and meters that can test blood taken from "alternative sites" of the body, other than fingertips. However, you should use standard fingertip testing if your glucose changes rapidly. Also, use standard testing if:  You have eaten, exercised, or taken insulin in the past 2 hours.  You think your glucose is low.  You tend to not feel symptoms of low blood glucose (hypoglycemia).  You are ill or under stress.  Clean the meter as directed by the manufacturer.  Test the meter for accuracy  as directed by the manufacturer.  Take your meter with you to your caregiver's office. This way, you can test your glucose in front of your caregiver to make sure you are using the meter correctly. Your caregiver can also take a sample of blood to test using a routine lab method. If values on the glucose meter are close to the lab results, you and your caregiver will see that your meter is working well and you are using good technique. Your caregiver will advise you about what to do if the results do not match. FREQUENCY OF TESTING  Your caregiver will tell you how often you should check your blood glucose. This will depend on your type of diabetes, your current level of  diabetes control, and your types of medicines. The following are general guidelines, but your care plan may be different. Record all your readings and the time of day you took them for review with your caregiver.   Diabetes type 1.  When you are using insulin with good diabetic control (either multiple daily injections or via a pump), you should check your glucose 4 times a day.  If your diabetes is not well controlled, you may need to monitor more frequently, including before meals and 2 hours after meals, at bedtime, and occasionally between 2 a.m. and 3 a.m.  You should always check your glucose before a dose of insulin or before changing the rate on your insulin pump.  Diabetes type 2.  Guidelines for SMBG in diabetes type 2 are not as well defined.  If you are on insulin, follow the guidelines above.  If you are on medicines, but not insulin, and your glucose is not well controlled, you should test at least twice daily.  If you are not on insulin, and your diabetes is controlled with medicines or diet alone, you should test at least once daily, usually before breakfast.  A weekly profile will help your caregiver advise you on your care plan. The week before your visit, check your glucose before a meal and 2 hours after a meal at least daily. You may want to test before and after a different meal each day so you and your caregiver can tell how well controlled your blood sugars are throughout the course of a 24 hour period.  Gestational diabetes (diabetes during pregnancy).  Frequent testing is often necessary. Accurate timing is important.  If you are not on insulin, check your glucose 4 times a day. Check it before breakfast and 1 hour after the start of each meal.  If you are on insulin, check your glucose 6 times a day. Check it before each meal and 1 hour after the first bite of each meal.  General guidelines.  More frequent testing is required at the start of insulin  treatment. Your caregiver will instruct you.  Test your glucose any time you suspect you have low blood sugar (hypoglycemia).  You should test more often when you change medicines, when you have unusual stress or illness, or in other unusual circumstances. OTHER THINGS TO KNOW ABOUT GLUCOSE METERS  Measurement Range. Most glucose meters are able to read glucose levels over a broad range of values from as low as 0 to as high as 600 mg/dL. If you get an extremely high or low reading from your meter, you should first confirm it with another reading. Report very high or very low readings to your caregiver.  Whole Blood Glucose versus Plasma Glucose. Some older home glucose  meters measure glucose in your whole blood. In a lab or when using some newer home glucose meters, the glucose is measured in your plasma (one component of blood). The difference can be important. It is important for you and your caregiver to know whether your meter gives its results as "whole blood equivalent" or "plasma equivalent."  Display of High and Low Glucose Values. Part of learning how to operate a meter is understanding what the meter results mean. Know how high and low glucose concentrations are displayed on your meter.  Factors that Affect Glucose Meter Performance. The accuracy of your test results depends on many factors and varies depending on the brand and type of meter. These factors include:  Low red blood cell count (anemia).  Substances in your blood (such as uric acid, vitamin C, and others).  Environmental factors (temperature, humidity, altitude).  Name-brand versus generic test strips.  Calibration. Make sure your meter is set up properly. It is a good idea to do a calibration test with a control solution recommended by the manufacturer of your meter whenever you begin using a fresh bottle of test strips. This will help verify the accuracy of your meter.  Improperly stored, expired, or defective test  strips. Keep your strips in a dry place with the lid on.  Soiled meter.  Inadequate blood sample. NEW TECHNOLOGIES FOR GLUCOSE TESTING Alternative site testing Some glucose meters allow testing blood from alternative sites. These include the:  Upper arm.  Forearm.  Base of the thumb.  Thigh. Sampling blood from alternative sites may be desirable. However, it may have some limitations. Blood in the fingertips show changes in glucose levels more quickly than blood in other parts of the body. This means that alternative site test results may be different from fingertip test results, not because of the meter's ability to test accurately, but because the actual glucose concentration can be different.  Continuous Glucose Monitoring Devices to measure your blood glucose continuously are available, and others are in development. These methods can be more expensive than self-monitoring with a glucose meter. However, it is uncertain how effective and reliable these devices are. Your caregiver will advise you if this approach makes sense for you. IF BLOOD SUGARS ARE CONTROLLED, PEOPLE WITH DIABETES REMAIN HEALTHIER.  SMBG is an important part of the treatment plan of patients with diabetes mellitus. Below are reasons for using SMBG:   It confirms that your glucose is at a specific, healthy level.  It detects hypoglycemia and severe hyperglycemia.  It allows you and your caregiver to make adjustments in response to changes in lifestyle for individuals requiring medicine.  It determines the need for starting insulin therapy in temporary diabetes that happens during pregnancy (gestational diabetes). Document Released: 12/11/2003 Document Revised: 03/01/2012 Document Reviewed: 04/03/2011 [facility] Patient Information 2014 [place], [place].

## 2013-07-07 NOTE — Consult Note (Signed)
WOC consult Note Reason for Consult: evaluation of foot wounds, pt under the care of Dr. Lajoyce Corners for bilateral plantar surface foot wounds.  Had STSG in May 2014 per Lajoyce Corners.  She currently has [facility] for wound care and also she is independent with wound care.  Pt with significant neuropathy and has hx. Of fibromyalgia also.   Wound type: Neuropathic foot ulcers.  Measurement: Left plantar foot lateral 4cm x 2cm x 0.2cm; Right plantar foot: 1cm x 1cm x 0.2cm  Wound ZOX:WRUE are pink, pale, non granular Drainage (amount, consistency, odor) moderate yellow-green from the left and yellow from the left, no odor Periwound: both are very macerated.   Dressing procedure/placement/frequency: continue POC in place from Dr. Lajoyce Corners, apparently she saw him yesterday and he wants to continue the silvadene.   Discussed with the bedside nurse, she will apply dressings once silvadene arrives to the unit.   Dry dressing placed until orders can be implemented. Re consult if needed, will not follow at this time. Thanks  Orazio Weller [employer], CWOCN [phone])

## 2013-07-07 NOTE — Progress Notes (Signed)
7/17  Spoke with patient about her diabetes.  Was diagnosed in 1993 and has been on insulin since then.  Patient takes 75/25 Humalog mixture insulin 140 units with breakfast and 85 units with supper.  States that she usually does not get up in the morning until around 10 AM.  Her meal times are not consistent. She states that she takes her insulin every day.  States she is able to afford her medications due to her insurance.  Checks her blood sugars at least once per day, but is supposed to be checking 4 times per day. States her blood sugars usually run 200-300s.  Sees Dr. Rogelia Boga as PCP.  Son does all the cooking since she is not able to be on her feet much. Will need follow up with PCP at discharge.   Will continue to follow while in hospital. Smith Mince RN BSN CDE

## 2013-07-07 NOTE — Progress Notes (Signed)
Subjective: Patient is drowsy this morning and states that she didn't get much sleep last night. She reports that since Tuesday she hasn't been taking her insulin or metformin. She denies, N/V/D, fever, CP, SOB. Her only complaint is muscle spasms in her abdomen and low back since yesterday evening. She has had this happen in the past and has received baclofen, which seemed to alleviate symptoms.  Objective: Vital signs in last 24 hours: Filed Vitals:   07/07/13 0917 07/07/13 0927 07/07/13 1159 07/07/13 1200  BP: 116/67  111/54 111/54  Pulse: 55  63 65  Temp:   97.4 F (36.3 C)   TempSrc:   Oral   Resp: 13  16 20   Height:      Weight:      SpO2: 95% 96% 98% 99%   Weight change:   Intake/Output Summary (Last 24 hours) at 07/07/13 1403 Last data filed at 07/07/13 1300  Gross per 24 hour  Intake    800 ml  Output      0 ml  Net    800 ml   Physical Exam General: drowsy this morning, NAD, reluctant to answer questions HEENT: vision is grossly intact, MMM Cardiac: RRR, no m/g/r Pulm: anterior breath sounds CTAB but very limited 2/2 body habitus, but normal WOB, appears comfortable Abd: obese, soft, mild TTP diffusely, nondistended, BS present Ext: warm and well perfused; no pitting edema, chronic venous stasis changes of the skin  Neuro: alert and oriented X3, cranial nerves II-XII grossly intact, moves all extremities spontaneously  Lab Results: Basic Metabolic Panel:  Recent Labs Lab 07/06/13 2309 07/06/13 2313 07/07/13 1112  NA 132* 136 135  K 4.1 4.1 5.3*  CL 92* 95* 98  CO2 31  --  21  GLUCOSE 489* 496* 140*  BUN 17 18 21   CREATININE 1.09 1.30* 1.40*  CALCIUM 10.2  --  8.9   Liver Function Tests:  Recent Labs Lab 07/06/13 2309  AST 56*  ALT 63*  ALKPHOS 117  BILITOT 0.3  PROT 8.0  ALBUMIN 3.5   CBC:  Recent Labs Lab 07/06/13 2309 07/06/13 2313 07/07/13 1112  WBC 9.0  --  9.5  NEUTROABS 3.5  --   --   HGB 14.2 15.3* 14.2  HCT 40.2 45.0 42.2   MCV 89.3  --  90.4  PLT 213  --  PLATELET CLUMPS NOTED ON SMEAR, UNABLE TO ESTIMATE   Cardiac Enzymes:  Recent Labs Lab 07/07/13 0400 07/07/13 0906  TROPONINI <0.30 <0.30   BNP:  Recent Labs Lab 07/07/13 0248  PROBNP 60.7   CBG:  Recent Labs Lab 07/07/13 0300 07/07/13 0408 07/07/13 0552 07/07/13 0703 07/07/13 0826 07/07/13 1203  GLUCAP 242* 221* 181* 167* 168* 183*   Urinalysis:  Recent Labs Lab 07/07/13 0321  COLORURINE YELLOW  LABSPEC 1.025  PHURINE 5.5  GLUCOSEU >1000*  HGBUR MODERATE*  BILIRUBINUR NEGATIVE  KETONESUR NEGATIVE  PROTEINUR NEGATIVE  UROBILINOGEN 0.2  NITRITE NEGATIVE  LEUKOCYTESUR MODERATE*   Micro Results: Recent Results (from the past 240 hour(s))  MRSA PCR SCREENING     Status: None   Collection Time    07/07/13  4:35 AM      Result Value Range Status   MRSA by PCR NEGATIVE  NEGATIVE Final   Comment:            The GeneXpert MRSA Assay (FDA     approved for NASAL specimens     only), is one component of a  comprehensive MRSA colonization     surveillance program. It is not     intended to diagnose MRSA     infection nor to guide or     monitor treatment for     MRSA infections.   Studies/Results: Dg Chest Portable 1 View  07/06/2013   *RADIOLOGY REPORT*  Clinical Data: Hyperglycemia.  PORTABLE CHEST - 1 VIEW  Comparison: Chest radiograph performed 04/30/2013  Findings: The lungs are well-aerated.  Mild peribronchial thickening is noted.  There is no evidence of focal opacification, pleural effusion or pneumothorax.  There is mild elevation of the left hemidiaphragm.  The cardiomediastinal silhouette is within normal limits.  No acute osseous abnormalities are seen.  IMPRESSION: Mild peribronchial thickening noted; lungs otherwise grossly clear.   Original Report Authenticated By: Tonia Ghent, M.D.   Medications: I have reviewed the patient's current medications. Scheduled Meds: . aspirin EC  81 mg Oral q morning - 10a    . buPROPion  150 mg Oral BID  . fluconazole  150 mg Oral Q72H  . heparin  5,000 Units Subcutaneous Q8H  . insulin aspart protamine- aspart  140 Units Subcutaneous Q breakfast   And  . insulin aspart protamine- aspart  85 Units Subcutaneous Q supper  . mometasone-formoterol  2 puff Inhalation BID  . pantoprazole  40 mg Oral Daily  . silver sulfADIAZINE   Topical Daily  . simvastatin  40 mg Oral q1800   Continuous Infusions: . sodium chloride     PRN Meds:.albuterol, albuterol, dextrose, ipratropium, oxyCODONE-acetaminophen  Assessment/Plan: This is a 52yo AA woman with h/o multiple medical problems including DM who presents with hyperglycemia >600 at home after two days of not taking home insulin or metformin.  Hyperglycemia: Pt with h/o poorly controlled DM (last HbA1c 8.0 on 5/14), CBG 497 with symptoms of polyuria and polydipsia. On admission, AG was 13, without ketonuria. Patient reports medication noncompliance on the day prior and day of admission. Patient does not have AMS; therefore, this is not thought to be HHS. Patient had been on insulin drip initially, but given her history of medication noncompliance, she was switched back to her home insulin regimen. After this alteration along with aggressive fluid resuscitation with NS, CBG were back at her baseline, 160s-180s. She is feeling her usual self and is ready to go home.  Pt last HgbA1c 8.0 on 5/14 -advance diet -home insulin regimen  HTN: Pt hypotensive on presentation 90/60. Given she appeared dry on admission this is likely due to intravascular depletion 2/2 osmotic diuresis which occurred from her hyperglycemia combined with the recent addition of metolazone. BP up to 116/67 here.  -will restart home medications upon discharge- though patient admits to medication noncompliance  DCHF: restarting home meds today  Vaginal candidiasis: pt with vaginal itchiness using OTC hydrocortisone creams with relief. Pt with hx of  extensive ABx use as well as diabetes.  -fluconazole 150mg  q72h x3 doses   COPD: VBG 7.33/55/43/31- likely chronic respiratory acidosis w/ metabolic compensation. Continuing home medications.   Dispo: Discharge today  The patient does have a current PCP Burns Spain, MD) and does not need an [facility] hospital follow-up appointment after discharge.  The patient does not have transportation limitations that hinder transportation to clinic appointments.  .Services Needed at time of discharge: Y = Yes, Blank = No PT:   OT:   RN:   Equipment:   Other:     LOS: 1 day   Windell Hummingbird, MD 07/07/2013, 2:03  PM

## 2013-07-07 NOTE — Progress Notes (Signed)
[facility]  Patient Status: Active (receiving services up to time of hospitalization)  [facility] is providing the following services: RN  If patient discharges after hours, please call [phone].   Jennifer [name] 07/07/2013, 10:40 AM

## 2013-07-07 NOTE — H&P (Signed)
Date: 07/07/2013               Patient Name:  Jennifer [name] MRN: [id number]  DOB: 09/02/1961 Age / Sex: 52 y.o., female   PCP: Burns Spain, MD         Medical Service: Internal Medicine Teaching Service         Attending Physician: Dr. Rocco Serene, MD    First Contact: Dr. Windell Hummingbird, MD Pager: [phone]  Second Contact: Dr. Janalyn Harder, MD Pager: [phone]       After Hours (After 5p/  First Contact Pager: [phone]  weekends / holidays): Second Contact Pager: [phone]   Chief Complaint: Elevated blood glucose   History of Present Illness: Patient is a 52 YO AAF with a PMH of DM, COPD, HTN, hyperlipidemia, and chronic diastolic HF.  She presents to the ED after checking her blood sugar at home this am and her meter indicated her fsbs was over 600.  She took some insulin and rechecked her blood sugar and it still read over 600.  She denies any fever, chills, N/V/D/C, or chest pain.  She admits to having some SOB and has not been taking her lasix because it makes her go to the bathroom often.  She has been having some vaginal itching and burning for the past couple of days.  She believes she has a yeast infection and therefore suspected that her sugar may be too high.    Blood glucose in the ED was 489.  She was given fluids and insulin drip and a fsbs dropped to 262.  She was hypotensive with a BP of 86/49.  Meds: Current Facility-Administered Medications  Medication Dose Route Frequency Provider Last Rate Last Dose  . insulin regular (NOVOLIN R,HUMULIN R) 1 Units/mL in sodium chloride 0.9 % 100 mL infusion   Intravenous Continuous Ankit Nanavati, MD 3.6 mL/hr at 07/07/13 0303 3.6 Units/hr at 07/07/13 0303   Current Outpatient Prescriptions  Medication Sig Dispense Refill  . albuterol (PROAIR HFA) 108 (90 BASE) MCG/ACT inhaler Inhale 2 puffs into the lungs every 6 (six) hours as needed for wheezing.  3 Inhaler  1  . albuterol (PROVENTIL) (5 MG/ML) 0.5% nebulizer  solution Take 0.5 mLs (2.5 mg total) by nebulization every 6 (six) hours as needed for wheezing or shortness of breath.  20 mL  5  . aspirin EC 81 MG tablet Take 1 tablet (81 mg total) by mouth every morning.  90 tablet  1  . atenolol (TENORMIN) 100 MG tablet Take 1 tablet (100 mg total) by mouth every morning.  90 tablet  1  . benazepril (LOTENSIN) 40 MG tablet Take 40 mg by mouth daily.      Marland Kitchen buPROPion (WELLBUTRIN SR) 150 MG 12 hr tablet Take 1 tablet (150 mg total) by mouth 2 (two) times daily.  180 tablet  1  . diphenhydrAMINE (BENADRYL) 25 MG tablet Take 25 mg by mouth every 8 (eight) hours.      Marland Kitchen esomeprazole (NEXIUM) 20 MG capsule Take 1 capsule (20 mg total) by mouth daily before breakfast.  90 capsule  1  . fluticasone (FLONASE) 50 MCG/ACT nasal spray Place 2 sprays into the nose 2 (two) times daily as needed for allergies.  48 g  1  . Fluticasone-Salmeterol (ADVAIR DISKUS) 250-50 MCG/DOSE AEPB Inhale 1 puff into the lungs 2 (two) times daily.  180 each  1  . furosemide (LASIX) 40 MG tablet Take 40 mg by mouth  2 (two) times daily. Take 1 tablet in the morning and 1 in the afternoon      . Insulin Lispro Prot & Lispro (HUMALOG MIX 75/25 KWIKPEN) (75-25) 100 UNIT/ML SUPN 140 units in the morning and 85 units in the evening  195 mL  1  . ipratropium (ATROVENT) 0.02 % nebulizer solution Take 2.5 mLs (0.5 mg total) by nebulization every 6 (six) hours as needed for wheezing.  75 mL  1  . metFORMIN (GLUCOPHAGE) 1000 MG tablet Take 1 tablet (1,000 mg total) by mouth 2 (two) times daily.  180 tablet  1  . metolazone (ZAROXOLYN) 5 MG tablet Take 5 mg by mouth daily.      . nicotine (NICOTROL) 10 MG inhaler Inhale 1 puff into the lungs as needed for smoking cessation. 6 to 16 cartridges per day for 6 to 12 wks. Than contact MD for instructions  42 each  1  . oxyCODONE-acetaminophen (ROXICET) 5-325 MG per tablet Take 2 tablets by mouth every 6 (six) hours as needed for pain.  240 tablet  0  .  pravastatin (PRAVACHOL) 40 MG tablet Take 1 tablet (40 mg total) by mouth every morning.  90 tablet  1  . pregabalin (LYRICA) 200 MG capsule Take 1 capsule (200 mg total) by mouth 3 (three) times daily.  90 capsule  1  . promethazine (PHENERGAN) 12.5 MG tablet Take 1 tablet (12.5 mg total) by mouth every 6 (six) hours as needed for nausea.  20 tablet  0  . silver sulfADIAZINE (SILVADENE) 1 % cream Apply 1 application topically daily.       . traZODone (DESYREL) 100 MG tablet Take 1 tablet (100 mg total) by mouth at bedtime.  90 tablet  1  . glucose blood (ONETOUCH VERIO) test strip Check blood sugar up to 4 times a day before meals and bedtime Dx code- 250.72 insulin-requiring  150 each  12  . glucose blood test strip Check blood sugar up to 4 times a day before meals and bedtime Dx code- 250.72, insulin requiring  150 each  12  . ONETOUCH DELICA LANCETS FINE MISC 1 each by Does not apply route 2 (two) times daily. Check blood sugar up to 4 times a day before meals and bedtime Dx code- 250.72, insulin requiring From 03/03/2013  100 each  12    Allergies: Allergies as of 07/06/2013 - Review Complete 07/06/2013  Allergen Reaction Noted  . Iohexol  08/17/2007  . Ivp dye (iodinated diagnostic agents)  05/10/2013  . Morphine sulfate Itching and Rash    Past Medical History  Diagnosis Date  . Depression   . GERD (gastroesophageal reflux disease)   . Hyperlipidemia   . Hypertension   . Glaucoma   . PVD (peripheral vascular disease) with claudication     Stents to bilateral common iliac arteries (left 2005, right 2008), on chronic plavix  . Tinnitus   . Diabetic peripheral neuropathy   . Fibromyalgia   . Hyperplastic colon polyp 12/2010    Per colonoscopy (12/2010) - Dr. Arlyce Dice  . Diverticulosis   . Asthma   . COPD 01/08/2007    PFT's 05/2007 : FEV1/FVC 82, FEV1 64% pred, FEF 25-75% 40% predicted, 16% improvement in FEV1 with bronchodilators.     . Type II diabetes mellitus with peripheral  circulatory disorders, uncontrolled (250.72) DX: 1993    Insulin dep. Poor control. Complicated by diabetic foot ulcer and diabetic eye disease.    . Chronic pain syndrome 12/03/2011  Likely secondary to depression, "fibromyalgia", neuropathy, and obesity.    . Tobacco abuse   . Chronic osteomyelitis of foot     chronic, right secondary to diabetic foot ulcers  . Polymicrobial bacterial infection 01/2013    GBS and S. aureus bacteremia // Source likely infected diabetic foot ulcer  . 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 Blood cultures 01/26/13 S. Aureus and GBS. Blood cultures Feb 6th, 8th, and 9th and March were negative.Repeat TEE 3/20 negative for vegitations  . 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 Chronic diastolic heart failure     grade 2 per 2D echocardiogram (01/2013)  . Ulcer of foot, chronic     Left. No OM per MRI (01/2013)  . DVT of upper extremity (deep vein thrombosis) 03/11/2013    Secondary to PICC line. Right brachial vein, diagnosed on 03/10/2013 Coumadin for 3 months. End date 06/10/2013    Past Surgical History  Procedure Laterality Date  . Abdominal hysterectomy  1997    secondary to uterine fibroids  . Tubal ligation    . Rotator cuff repair      bilateral  . Breast biopsy      multiple-benign per pt  . Wrist surgery      right  . Ganglion cyst excision      multiple  . Other surgical history      stents in lower ext  . Tee without cardioversion N/A 01/31/2013    Procedure: TRANSESOPHAGEAL ECHOCARDIOGRAM (TEE);  Surgeon: Pricilla Riffle, MD;  Location: [facility] ENDOSCOPY;  Service: Cardiovascular;  Laterality: N/A;  Rm [phone]  . Bladder surgery      bladder reconstruction surgery  . Tee without cardioversion N/A 03/10/2013    Procedure: TRANSESOPHAGEAL ECHOCARDIOGRAM (TEE);  Surgeon: Laurey Morale, MD;  Location: [facility] ENDOSCOPY;  Service: Cardiovascular;  Laterality: N/A;  Rm. [id number]  . Skin  grafts Bilateral 05/13/2013    Dr Lajoyce Corners  . Skin split graft Bilateral 05/13/2013    Procedure: Right and Left Foot Allograft Skin Graft;  Surgeon: Nadara Mustard, MD;  Location: [facility] OR;  Service: Orthopedics;  Laterality: Bilateral;  Right and Left Foot Allograft Skin Graft   Family History  Problem Relation Age of Onset  . Diverticulosis Mother   . Diabetes Mother   . Hypertension Mother   . Congestive Heart Failure Mother   . Asthma Father    History   Social History  . Marital Status: Single    Spouse Name: N/A    Number of Children: 2  . Years of Education: college   Occupational History  . Disability     previously worked as a Psychologist, counselling History Main Topics  . Smoking status: Current Every Day Smoker -- 0.50 packs/day for 40 years    Types: Cigarettes  . Smokeless tobacco: Never Used     Comment: Half pack or less. Smokes anpack per week.      '  I already have information on quitting "  . Alcohol Use: No  . Drug Use: Yes    Special: Marijuana, "Crack" cocaine     Comment: none in many years  . Sexually Active: Not on file     Comment: states she is currently trying to quit smoking   Other Topics Concern  . Not on file   Social History Narrative   On disability. Lives with son in [place]. Formerly worked  as cook.    Boyfriend passed away stage 4 cancer 03-11-13.    Review of Systems: Pertinent items are noted in HPI.  Physical Exam: Blood pressure 98/41, pulse 71, temperature 98 F (36.7 C), temperature source Oral, resp. rate 17, SpO2 96.00%. General: resting in bed in no obvious distress HEENT: PERRL, EOMI, no scleral icterus, dry oral mucosa Cardiac: RRR, no rubs, murmurs or gallops Pulm: clear to auscultation bilaterally Abd: obese, soft, nontender, nondistended, BS present Ext: warm and well perfused; no pitting edema, chronic venous stasis changes of the skin Neuro: alert and oriented X3, cranial nerves II-XII grossly intact  Lab  results: Basic Metabolic Panel:  Recent Labs  11/91/47 2309 07/06/13 2313  NA 132* 136  K 4.1 4.1  CL 92* 95*  CO2 31  --   GLUCOSE 489* 496*  BUN 17 18  CREATININE 1.09 1.30*  CALCIUM 10.2  --    Liver Function Tests:  Recent Labs  07/06/13 2309  AST 56*  ALT 63*  ALKPHOS 117  BILITOT 0.3  PROT 8.0  ALBUMIN 3.5   CBC:  Recent Labs  07/06/13 2309 07/06/13 2313  WBC 9.0  --   NEUTROABS 3.5  --   HGB 14.2 15.3*  HCT 40.2 45.0  MCV 89.3  --   PLT 213  --     Recent Labs  07/06/13 2300 07/07/13 0146 07/07/13 0300  GLUCAP 467* 262* 242*   Urine Drug Screen: Drugs of Abuse     Component Value Date/Time   LABOPIA NONE DETECTED 01/26/2013 0036   LABOPIA SEE NOTE 07/21/2012 1631   COCAINSCRNUR NONE DETECTED 01/26/2013 0036   COCAINSCRNUR NEG 07/21/2012 1631   LABBENZ NONE DETECTED 01/26/2013 0036   LABBENZ NEG 07/21/2012 1631   LABBENZ NEG 06/28/2010 2130   AMPHETMU NONE DETECTED 01/26/2013 0036   AMPHETMU NEG 06/28/2010 2130   THCU NONE DETECTED 01/26/2013 0036   LABBARB NONE DETECTED 01/26/2013 0036   LABBARB NEG 07/21/2012 1631    Imaging results:  Dg Chest Portable 1 View  07/06/2013   *RADIOLOGY REPORT*  Clinical Data: Hyperglycemia.  PORTABLE CHEST - 1 VIEW  Comparison: Chest radiograph performed 04/30/2013  Findings: The lungs are well-aerated.  Mild peribronchial thickening is noted.  There is no evidence of focal opacification, pleural effusion or pneumothorax.  There is mild elevation of the left hemidiaphragm.  The cardiomediastinal silhouette is within normal limits.  No acute osseous abnormalities are seen.  IMPRESSION: Mild peribronchial thickening noted; lungs otherwise grossly clear.   Original Report Authenticated By: Tonia Ghent, M.D.    Other results: EKG: normal EKG, normal sinus rhythm, unchanged from previous tracings, poor R wave progression  Assessment & Plan by Problem:  HHS/HONK: pt CBG 497 on admission, AG 13, and VBG 7.33/55/43/31 and UA  still pending to evaluate for ketones. Pt reports elevated CBGs for 2-3 days correlating with vaginal itchiness, SOB polyuria, and polydipsia. Pt received 1L IVF and D5+ 1/2 NS along with insulin gtt. 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 Aureus bacteremia treated with Ancef ans TEE on 3/20 showing resolution of vegetations, and hx of bilateral non-healing wound ulcers s/p debridement and grafts in 5/14 making abscess or repeat bacteremia a consideration, pt  complained of some back pain on 06/28/13 clinic visit ?back abscess may consider imaging if continues to deteriorate or become febrile. Pt last HgbA1c 8.0 on 5/14.   -d/c D5+ 1/2 NS -stepdown -DKA protocol -NPO -UA/UCx/BCx - -AM EKG -trops   HTN: pt hypotensive on presentation 90/60 despite medication non-compliance as outpt. Pt was started on metaoloze on 7/8 visit 2/2 increased WG (284>>293 lbs) on admission pt PE appears intravascular depleted.  -holding BP meds (lasix, metolazone, benazepril, atenolol)   DCHF: last echo 4/14 showed EF 60-65% and only mildly dialted LA and poorly visualized RA. This was confirmed by TEE in 3/14 for f/u on IE and reported similar findings. Pt reports intermittent compliance with meds not wanting to take diuretics when riding the bus or going to the doctor. -holding all BP meds in setting of hypotension  -daily weights  Vaginal candidiasis: pt with vaginal itchiness using OTC hydrocortisone creams with relief. Pt with hx of extensive ABx use. -fluconazole 150mg  q72h x3 doses  Depression: Stable. continued home meds  Dispo: Disposition is deferred at this time, awaiting improvement of current medical problems. Anticipated discharge in approximately 2-3 day(s).    The patient does have a current PCP Burns Spain, MD) and does need an [facility] hospital follow-up appointment after discharge.  The patient does have transportation limitations that hinder transportation to clinic appointments.  Signed: Boykin Peek, MD 07/07/2013, 4:01 AM

## 2013-07-07 NOTE — Discharge Summary (Signed)
Name: RUHEE ENCK MRN: [id number] DOB: 18-May-1961 52 y.o. PCP: Burns Spain, MD  Date of Admission: 07/06/2013 11:07 PM Date of Discharge: 07/07/2013 Attending Physician: Rocco Serene, MD  Discharge Diagnosis: Principal Problem:   Hyperglycemia- secondary to insulin and metformin noncompliance 2 days PTA Active Problems:   Type II diabetes mellitus with peripheral circulatory disorders, uncontrolled    Hypertension   COPD- likely with chronic resp acidosis as per VBG   Chronic pain syndrome   Chronic diastolic heart failure   Tobacco abuse  Discharge Medications:   Medication List    STOP taking these medications       metolazone 5 MG tablet  Commonly known as:  ZAROXOLYN      TAKE these medications       albuterol 108 (90 BASE) MCG/ACT inhaler  Commonly known as:  PROAIR HFA  Inhale 2 puffs into the lungs every 6 (six) hours as needed for wheezing.     albuterol (5 MG/ML) 0.5% nebulizer solution  Commonly known as:  PROVENTIL  Take 0.5 mLs (2.5 mg total) by nebulization every 6 (six) hours as needed for wheezing or shortness of breath.     aspirin EC 81 MG tablet  Take 1 tablet (81 mg total) by mouth every morning.     atenolol 100 MG tablet  Commonly known as:  TENORMIN  Take 1 tablet (100 mg total) by mouth every morning.     baclofen 10 MG tablet  Commonly known as:  LIORESAL  Take 1 tablet (10 mg total) by mouth 3 (three) times daily.     benazepril 40 MG tablet  Commonly known as:  LOTENSIN  Take 40 mg by mouth daily.     buPROPion 150 MG 12 hr tablet  Commonly known as:  WELLBUTRIN SR  Take 1 tablet (150 mg total) by mouth 2 (two) times daily.     diphenhydrAMINE 25 MG tablet  Commonly known as:  BENADRYL  Take 25 mg by mouth every 8 (eight) hours.     esomeprazole 20 MG capsule  Commonly known as:  NEXIUM  Take 1 capsule (20 mg total) by mouth daily before breakfast.     fluconazole 150 MG tablet  Commonly known as:  DIFLUCAN    Take 1 tablet (150 mg total) by mouth every 3 (three) days.     fluticasone 50 MCG/ACT nasal spray  Commonly known as:  FLONASE  Place 2 sprays into the nose 2 (two) times daily as needed for allergies.     Fluticasone-Salmeterol 250-50 MCG/DOSE Aepb  Commonly known as:  ADVAIR DISKUS  Inhale 1 puff into the lungs 2 (two) times daily.     furosemide 40 MG tablet  Commonly known as:  LASIX  Take 40 mg by mouth 2 (two) times daily. Take 1 tablet in the morning and 1 in the afternoon     glucose blood test strip  Check blood sugar up to 4 times a day before meals and bedtime Dx code- 250.72, insulin requiring     glucose blood test strip  Commonly known as:  ONETOUCH VERIO  Check blood sugar up to 4 times a day before meals and bedtime Dx code- 250.72 insulin-requiring     Insulin Lispro Prot & Lispro (75-25) 100 UNIT/ML Supn  Commonly known as:  HUMALOG MIX 75/25 KWIKPEN  140 units in the morning and 85 units in the evening     ipratropium 0.02 % nebulizer solution  Commonly  known as:  ATROVENT  Take 2.5 mLs (0.5 mg total) by nebulization every 6 (six) hours as needed for wheezing.     metFORMIN 1000 MG tablet  Commonly known as:  GLUCOPHAGE  Take 1 tablet (1,000 mg total) by mouth 2 (two) times daily.     nicotine 10 MG inhaler  Commonly known as:  NICOTROL  Inhale 1 puff into the lungs as needed for smoking cessation. 6 to 16 cartridges per day for 6 to 12 wks. Than contact MD for instructions     ONETOUCH DELICA LANCETS FINE Misc  - 1 each by Does not apply route 2 (two) times daily. Check blood sugar up to 4 times a day before meals and bedtime Dx code- 250.72, insulin requiring  - From 03/03/2013     oxyCODONE-acetaminophen 5-325 MG per tablet  Commonly known as:  ROXICET  Take 2 tablets by mouth every 6 (six) hours as needed for pain.     pravastatin 40 MG tablet  Commonly known as:  PRAVACHOL  Take 1 tablet (40 mg total) by mouth every morning.     pregabalin  200 MG capsule  Commonly known as:  LYRICA  Take 1 capsule (200 mg total) by mouth 3 (three) times daily.     promethazine 12.5 MG tablet  Commonly known as:  PHENERGAN  Take 1 tablet (12.5 mg total) by mouth every 6 (six) hours as needed for nausea.     silver sulfADIAZINE 1 % cream  Commonly known as:  SILVADENE  Apply 1 application topically daily.     traZODone 100 MG tablet  Commonly known as:  DESYREL  Take 1 tablet (100 mg total) by mouth at bedtime.       Disposition and follow-up:   Ms.Tanith L [name] was discharged from [facility] in Stable condition.  At the hospital follow up visit please address:  1.  HTN-  Patient reports medication noncompliance, but BP was low during this admission. Please assess need for antihypertensives as well as what patient is actually taking at home. 2. DM- reiterate to patient the need to take her insulin and metformin as prescribed.  3. Muscle spasms- Patient reports having had muscle spasms in the past and she c/o of spasm of abdomen and low back during this admission. Discharged with approx 1 week supply of baclofen 10mg . 4. Vaginal candidiasis- Patient with vaginal itching in the setting of diabetes and recent antibiotic use. Verify resolution after fluconazole therapy.   2.  Labs / imaging needed at time of follow-up: none  3.  Pending labs/ test needing follow-up: none  Follow-up Appointments:     Follow-up Information   Follow up with Blanch Media, MD On 08/02/2013. (9:15am)    Contact information:   [address] [place] [address] [phone]       Discharge Instructions: Discharge Orders   Future Appointments Provider Department Dept Phone   08/02/2013 9:15 AM Burns Spain, MD [facility] [phone]   Future Orders Complete By Expires     (HEART FAILURE PATIENTS) Call MD:  Anytime you have any of the following symptoms: 1) 3 pound weight gain in 24 hours  or 5 pounds in 1 week 2) shortness of breath, with or without a dry hacking cough 3) swelling in the hands, feet or stomach 4) if you have to sleep on extra pillows at night in order to breathe.  As directed     Call MD  for:  persistant nausea and vomiting  As directed     Call MD for:  severe uncontrolled pain  As directed     Call MD for:  temperature >100.4  As directed     Diet - low sodium heart healthy  As directed     Increase activity slowly  As directed        Consultations:  none  Procedures Performed:  Dg Chest Portable 1 View  07/06/2013   *RADIOLOGY REPORT*  Clinical Data: Hyperglycemia.  PORTABLE CHEST - 1 VIEW  Comparison: Chest radiograph performed 04/30/2013  Findings: The lungs are well-aerated.  Mild peribronchial thickening is noted.  There is no evidence of focal opacification, pleural effusion or pneumothorax.  There is mild elevation of the left hemidiaphragm.  The cardiomediastinal silhouette is within normal limits.  No acute osseous abnormalities are seen.  IMPRESSION: Mild peribronchial thickening noted; lungs otherwise grossly clear.   Original Report Authenticated By: Tonia Ghent, M.D.   Admission HPI:  Patient is a 52 YO AAF with a PMH of DM, COPD, HTN, hyperlipidemia, and chronic diastolic HF. She presents to the ED after checking her blood sugar at home this am and her meter indicated her fsbs was over 600. She took some insulin and rechecked her blood sugar and it still read over 600. She denies any fever, chills, N/V/D/C, or chest pain. She admits to having some SOB and has not been taking her lasix because it makes her go to the bathroom often. She has been having some vaginal itching and burning for the past couple of days. She believes she has a yeast infection and therefore suspected that her sugar may be too high.   Blood glucose in the ED was 489. She was given fluids and insulin drip and a fsbs dropped to 262. She was hypotensive with a BP of  86/49.   Hospital Course by problem list:   1. Hyperglycemia: Pt with h/o poorly controlled DM (last HbA1c 8.0 on 5/14), CBG 497 with symptoms of polyuria and polydipsia. On admission, AG was 13, without ketonuria. Possibility for infection was investigated with CXR (no concern for PNA) and UA (likely contaminated, no clear UTI), so infection was not thought to be exacerbating or precipitating the hyperglycemia. Patient reports medication noncompliance on the day prior and day of admission. Patient did not have AMS; therefore, this was not thought to be HHS. Patient had been managed on insulin drip initially in ED, but given her recent history of medication noncompliance it was thought she would benefit most from restarting her home insulin regimen. After this alteration along with aggressive fluid resuscitation with NS, CBG were back at her baseline, 160s-180s. At discharge, she was feeling her usual self.   2. HTN: Pt hypotensive on presentation 90/60. Given she appeared dry on admission this was likely due to intravascular depletion 2/2 osmotic diuresis with the hyperglycemia combined with the recent addition of metolazone (last dose was supposed to be 7/16, so this was stopped at discharge). BP between 111-166/54-139 during her stay. Home medications were restarted at discharge, though patient admits to medication noncompliance.  3. Vaginal candidiasis: Patient with vaginal itchiness PTA and had been using OTC hydrocortisone creams with relief of symptoms. Pt with hx of extensive ABx use as well as diabetes, so thought to be vaginal candidiasis. Treated with fluconazole 150mg  q72h x3 doses.  4. Muscle spasm:  Patient c/o having muscle spasms x 1 day in her back and abdomen that continued  during her stay here. These spasms are identical to spasms she has had in the past for which she received baclofen that seemed to alleviate her symptoms. Discharged patient with baclofen 10mg  po tid for approx 1 week.    Discharge Vitals:   BP 111/54  Pulse 65  Temp(Src) 97.4 F (36.3 C) (Oral)  Resp 20  Ht 5\' 7"  (1.702 m)  Wt 279 lb 12.2 oz (126.9 kg)  BMI 43.81 kg/m2  SpO2 99%  Discharge Labs:  Results for orders placed during the hospital encounter of 07/06/13 (from the past 24 hour(s))  GLUCOSE, CAPILLARY     Status: Abnormal   Collection Time    07/06/13 11:00 PM      Result Value Range   Glucose-Capillary 467 (*) 70 - 99 mg/dL  CBC WITH DIFFERENTIAL     Status: Abnormal   Collection Time    07/06/13 11:09 PM      Result Value Range   WBC 9.0  4.0 - 10.5 K/uL   RBC 4.50  3.87 - 5.11 MIL/uL   Hemoglobin 14.2  12.0 - 15.0 g/dL   HCT 16.1  09.6 - 04.5 %   MCV 89.3  78.0 - 100.0 fL   MCH 31.6  26.0 - 34.0 pg   MCHC 35.3  30.0 - 36.0 g/dL   RDW 40.9  81.1 - 91.4 %   Platelets 213  150 - 400 K/uL   Neutrophils Relative % 39 (*) 43 - 77 %   Neutro Abs 3.5  1.7 - 7.7 K/uL   Lymphocytes Relative 55 (*) 12 - 46 %   Lymphs Abs 5.0 (*) 0.7 - 4.0 K/uL   Monocytes Relative 5  3 - 12 %   Monocytes Absolute 0.4  0.1 - 1.0 K/uL   Eosinophils Relative 1  0 - 5 %   Eosinophils Absolute 0.1  0.0 - 0.7 K/uL   Basophils Relative 1  0 - 1 %   Basophils Absolute 0.1  0.0 - 0.1 K/uL  COMPREHENSIVE METABOLIC PANEL     Status: Abnormal   Collection Time    07/06/13 11:09 PM      Result Value Range   Sodium 132 (*) 135 - 145 mEq/L   Potassium 4.1  3.5 - 5.1 mEq/L   Chloride 92 (*) 96 - 112 mEq/L   CO2 31  19 - 32 mEq/L   Glucose, Bld 489 (*) 70 - 99 mg/dL   BUN 17  6 - 23 mg/dL   Creatinine, Ser 7.82  0.50 - 1.10 mg/dL   Calcium 95.6  8.4 - 21.3 mg/dL   Total Protein 8.0  6.0 - 8.3 g/dL   Albumin 3.5  3.5 - 5.2 g/dL   AST 56 (*) 0 - 37 U/L   ALT 63 (*) 0 - 35 U/L   Alkaline Phosphatase 117  39 - 117 U/L   Total Bilirubin 0.3  0.3 - 1.2 mg/dL   GFR calc non Af Amer 56 (*) >90 mL/min   GFR calc Af Amer 65 (*) >90 mL/min  POCT I-STAT TROPONIN I     Status: None   Collection Time    07/06/13  11:11 PM      Result Value Range   Troponin i, poc 0.02  0.00 - 0.08 ng/mL   Comment 3           POCT I-STAT, CHEM 8     Status: Abnormal   Collection Time    07/06/13 11:13  PM      Result Value Range   Sodium 136  135 - 145 mEq/L   Potassium 4.1  3.5 - 5.1 mEq/L   Chloride 95 (*) 96 - 112 mEq/L   BUN 18  6 - 23 mg/dL   Creatinine, Ser 5.40 (*) 0.50 - 1.10 mg/dL   Glucose, Bld 981 (*) 70 - 99 mg/dL   Calcium, Ion 1.91  4.78 - 1.23 mmol/L   TCO2 28  0 - 100 mmol/L   Hemoglobin 15.3 (*) 12.0 - 15.0 g/dL   HCT 29.5  62.1 - 30.8 %  GLUCOSE, CAPILLARY     Status: Abnormal   Collection Time    07/07/13  1:46 AM      Result Value Range   Glucose-Capillary 262 (*) 70 - 99 mg/dL   Comment 1 Notify RN     Comment 2 Documented in Chart    POCT I-STAT 3, BLOOD GAS (G3P V)     Status: Abnormal   Collection Time    07/07/13  1:59 AM      Result Value Range   pH, Ven 7.339 (*) 7.250 - 7.300   pCO2, Ven 55.0 (*) 45.0 - 50.0 mmHg   pO2, Ven 43.0  30.0 - 45.0 mmHg   Bicarbonate 29.6 (*) 20.0 - 24.0 mEq/L   TCO2 31  0 - 100 mmol/L   O2 Saturation 74.0     Acid-Base Excess 2.0  0.0 - 2.0 mmol/L   Sample type VENOUS    PRO B NATRIURETIC PEPTIDE     Status: None   Collection Time    07/07/13  2:48 AM      Result Value Range   Pro B Natriuretic peptide (BNP) 60.7  0 - 125 pg/mL  GLUCOSE, CAPILLARY     Status: Abnormal   Collection Time    07/07/13  3:00 AM      Result Value Range   Glucose-Capillary 242 (*) 70 - 99 mg/dL  URINALYSIS, ROUTINE W REFLEX MICROSCOPIC     Status: Abnormal   Collection Time    07/07/13  3:21 AM      Result Value Range   Color, Urine YELLOW  YELLOW   APPearance CLOUDY (*) CLEAR   Specific Gravity, Urine 1.025  1.005 - 1.030   pH 5.5  5.0 - 8.0   Glucose, UA >1000 (*) NEGATIVE mg/dL   Hgb urine dipstick MODERATE (*) NEGATIVE   Bilirubin Urine NEGATIVE  NEGATIVE   Ketones, ur NEGATIVE  NEGATIVE mg/dL   Protein, ur NEGATIVE  NEGATIVE mg/dL   Urobilinogen,  UA 0.2  0.0 - 1.0 mg/dL   Nitrite NEGATIVE  NEGATIVE   Leukocytes, UA MODERATE (*) NEGATIVE  URINE MICROSCOPIC-ADD ON     Status: Abnormal   Collection Time    07/07/13  3:21 AM      Result Value Range   Squamous Epithelial / LPF MANY (*) RARE   WBC, UA 21-50  <3 WBC/hpf   RBC / HPF 7-10  <3 RBC/hpf   Bacteria, UA MANY (*) RARE   Casts HYALINE CASTS (*) NEGATIVE   Urine-Other RARE YEAST    TROPONIN I     Status: None   Collection Time    07/07/13  4:00 AM      Result Value Range   Troponin I <0.30  <0.30 ng/mL  GLUCOSE, CAPILLARY     Status: Abnormal   Collection Time    07/07/13  4:08 AM  Result Value Range   Glucose-Capillary 221 (*) 70 - 99 mg/dL  MRSA PCR SCREENING     Status: None   Collection Time    07/07/13  4:35 AM      Result Value Range   MRSA by PCR NEGATIVE  NEGATIVE  GLUCOSE, CAPILLARY     Status: Abnormal   Collection Time    07/07/13  5:52 AM      Result Value Range   Glucose-Capillary 181 (*) 70 - 99 mg/dL  GLUCOSE, CAPILLARY     Status: Abnormal   Collection Time    07/07/13  7:03 AM      Result Value Range   Glucose-Capillary 167 (*) 70 - 99 mg/dL  GLUCOSE, CAPILLARY     Status: Abnormal   Collection Time    07/07/13  8:26 AM      Result Value Range   Glucose-Capillary 168 (*) 70 - 99 mg/dL  TROPONIN I     Status: None   Collection Time    07/07/13  9:06 AM      Result Value Range   Troponin I <0.30  <0.30 ng/mL  BASIC METABOLIC PANEL     Status: Abnormal   Collection Time    07/07/13 11:12 AM      Result Value Range   Sodium 135  135 - 145 mEq/L   Potassium 5.3 (*) 3.5 - 5.1 mEq/L   Chloride 98  96 - 112 mEq/L   CO2 21  19 - 32 mEq/L   Glucose, Bld 140 (*) 70 - 99 mg/dL   BUN 21  6 - 23 mg/dL   Creatinine, Ser 1.61 (*) 0.50 - 1.10 mg/dL   Calcium 8.9  8.4 - 09.6 mg/dL   GFR calc non Af Amer 42 (*) >90 mL/min   GFR calc Af Amer 48 (*) >90 mL/min  CBC     Status: Abnormal   Collection Time    07/07/13 11:12 AM      Result Value  Range   WBC 9.5  4.0 - 10.5 K/uL   RBC 4.67  3.87 - 5.11 MIL/uL   Hemoglobin 14.2  12.0 - 15.0 g/dL   HCT 04.5  40.9 - 81.1 %   MCV 90.4  78.0 - 100.0 fL   MCH 30.4  26.0 - 34.0 pg   MCHC 33.6  30.0 - 36.0 g/dL   RDW 91.4 (*) 78.2 - 95.6 %   Platelets PLATELET CLUMPS NOTED ON SMEAR, UNABLE TO ESTIMATE  150 - 400 K/uL  GLUCOSE, CAPILLARY     Status: Abnormal   Collection Time    07/07/13 12:03 PM      Result Value Range   Glucose-Capillary 183 (*) 70 - 99 mg/dL    Signed: Windell Hummingbird, MD 07/07/2013, 3:13 PM   Time Spent on Discharge: 45 minutes Services Ordered on Discharge: none Equipment Ordered on Discharge: none

## 2013-07-07 NOTE — H&P (Signed)
I saw and evaluated the patient. I reviewed the resident's note and confirmed the resident's findings.  I agree with the assessment and plan as documented in the resident's note with the following modifications.  Jennifer [name] is a 52 year old woman with a history of diabetes, hypertension, hyperlipidemia, and chronic diastolic heart failure who presents with an elevated blood sugar. She admits to not taking any of her medications including her insulin for one to 2 days prior to admission. She subsequently noted that her blood sugar, when checked, was over 600. The reason why she checked it was because she had some vaginal itching and burning and in the past when she's had yeast infections has noted hyperglycemia. She called her home nurse who recommended she present to the emergency department. She was admitted to the internal medicine teaching service for further evaluation. She denies any fevers, shakes, chills, nausea, vomiting, diarrhea, or chest pain. She does have some abdominal cramping pain throughout her whole abdomen and some shortness of breath which she attributes to not taking her Lasix. Examination reveals a mildly tender abdomen with no evidence of rebound. She likely has hyperglycemia with resultant osmotic diuresis and dehydration that resulted in admission to the hospital. This almost surely is secondary to noncompliance with her diabetic regimen. Her glucose is now under better control with reinitiation of insulin therapy and she is being aggressively hydrated.  Her candidal vaginitis is being treated with fluconazole.  I anticipate she will be ready for discharge later today.

## 2013-07-07 NOTE — Progress Notes (Signed)
Pt with bilateral diabetic foot ulcers, currently wrapped by outside wound care center, present on admission.

## 2013-07-07 NOTE — ED Notes (Signed)
Pt to br for urine sample.  Pt sts unable to use bedpan.

## 2013-07-07 NOTE — Progress Notes (Signed)
7/17  Recommend adding Novolog MODERATE correction scale AC & HS if CBGs continue greater than 180 mg/dl.  Smith Mince RN BSN CDE

## 2013-07-08 LAB — URINE CULTURE: Colony Count: 50000

## 2013-07-08 NOTE — Discharge Summary (Signed)
Agree with checking need for continued antihypertensives at follow-up although I believe the relative hypotension was secondary to dehydration from an osmotic diuresis associated with the hyperglycemia from the insulin non-compliance.

## 2013-07-13 LAB — CULTURE, BLOOD (ROUTINE X 2)
Culture: NO GROWTH
Culture: NO GROWTH

## 2013-07-16 ENCOUNTER — Other Ambulatory Visit: Payer: Self-pay | Admitting: Internal Medicine

## 2013-07-17 ENCOUNTER — Other Ambulatory Visit: Payer: Self-pay | Admitting: Infectious Disease

## 2013-07-18 NOTE — Telephone Encounter (Signed)
I ordered a 6 month supply on 6/25. It is at [facility]. If she wants to change pharmacies again, then it is up to her to call her pharm and get the Rx transferred.

## 2013-07-22 ENCOUNTER — Telehealth: Payer: Self-pay | Admitting: *Deleted

## 2013-07-27 ENCOUNTER — Telehealth: Payer: Self-pay | Admitting: *Deleted

## 2013-07-27 ENCOUNTER — Other Ambulatory Visit: Payer: Self-pay | Admitting: *Deleted

## 2013-07-27 ENCOUNTER — Other Ambulatory Visit: Payer: Self-pay | Admitting: Internal Medicine

## 2013-07-27 NOTE — Telephone Encounter (Signed)
Pt calls and states she rec'd approval from insurance provider for mri but she had not heard from anyone about when she was having it, states she has her phone w/ her all the time and has not gotten a call. Please call her asap and discuss this w/ her. Thank you

## 2013-07-27 NOTE — Telephone Encounter (Signed)
Pharmacy made awared.

## 2013-07-27 NOTE — Telephone Encounter (Signed)
Fax from [facility].

## 2013-07-27 NOTE — Telephone Encounter (Signed)
Refilled both 06/15/13 with refills. She needs to call pharm

## 2013-07-28 ENCOUNTER — Ambulatory Visit (HOSPITAL_COMMUNITY): Payer: Medicare Other

## 2013-08-01 ENCOUNTER — Encounter: Payer: Self-pay | Admitting: Internal Medicine

## 2013-08-01 ENCOUNTER — Ambulatory Visit (HOSPITAL_COMMUNITY)
Admission: RE | Admit: 2013-08-01 | Discharge: 2013-08-01 | Disposition: A | Discharge status: Home / Self Care | Payer: Medicare Other | Source: Ambulatory Visit | Attending: Internal Medicine | Admitting: Internal Medicine

## 2013-08-01 DIAGNOSIS — M545 Low back pain, unspecified: Secondary | ICD-10-CM | POA: Insufficient documentation

## 2013-08-01 DIAGNOSIS — D1809 Hemangioma of other sites: Secondary | ICD-10-CM | POA: Insufficient documentation

## 2013-08-01 DIAGNOSIS — G894 Chronic pain syndrome: Secondary | ICD-10-CM

## 2013-08-01 DIAGNOSIS — M5380 Other specified dorsopathies, site unspecified: Secondary | ICD-10-CM | POA: Insufficient documentation

## 2013-08-01 DIAGNOSIS — R29898 Other symptoms and signs involving the musculoskeletal system: Secondary | ICD-10-CM | POA: Insufficient documentation

## 2013-08-01 DIAGNOSIS — M5126 Other intervertebral disc displacement, lumbar region: Secondary | ICD-10-CM | POA: Insufficient documentation

## 2013-08-02 ENCOUNTER — Encounter: Payer: Self-pay | Admitting: Internal Medicine

## 2013-08-02 ENCOUNTER — Ambulatory Visit (INDEPENDENT_AMBULATORY_CARE_PROVIDER_SITE_OTHER): Payer: Medicare Other | Admitting: Internal Medicine

## 2013-08-02 VITALS — BP 168/87 | HR 70 | Temp 97.8°F | Ht 67.0 in | Wt 295.1 lb

## 2013-08-02 DIAGNOSIS — E1159 Type 2 diabetes mellitus with other circulatory complications: Secondary | ICD-10-CM

## 2013-08-02 DIAGNOSIS — M86679 Other chronic osteomyelitis, unspecified ankle and foot: Secondary | ICD-10-CM

## 2013-08-02 DIAGNOSIS — Z720 Tobacco use: Secondary | ICD-10-CM

## 2013-08-02 DIAGNOSIS — F17200 Nicotine dependence, unspecified, uncomplicated: Secondary | ICD-10-CM

## 2013-08-02 DIAGNOSIS — G894 Chronic pain syndrome: Secondary | ICD-10-CM

## 2013-08-02 DIAGNOSIS — M86671 Other chronic osteomyelitis, right ankle and foot: Secondary | ICD-10-CM

## 2013-08-02 DIAGNOSIS — I5032 Chronic diastolic (congestive) heart failure: Secondary | ICD-10-CM

## 2013-08-02 LAB — GLUCOSE, CAPILLARY: Glucose-Capillary: 173 mg/dL — ABNORMAL HIGH (ref 70–99)

## 2013-08-02 LAB — BASIC METABOLIC PANEL WITH GFR
BUN: 15 mg/dL (ref 6–23)
CO2: 31 mEq/L (ref 19–32)
Calcium: 8.9 mg/dL (ref 8.4–10.5)
Chloride: 101 mEq/L (ref 96–112)
Creat: 0.92 mg/dL (ref 0.50–1.10)
GFR, Est African American: 82 mL/min
GFR, Est Non African American: 71 mL/min
Glucose, Bld: 224 mg/dL — ABNORMAL HIGH (ref 70–99)
Potassium: 3.6 mEq/L (ref 3.5–5.3)
Sodium: 138 mEq/L (ref 135–145)

## 2013-08-02 LAB — MAGNESIUM: Magnesium: 1.5 mg/dL (ref 1.5–2.5)

## 2013-08-02 MED ORDER — METOLAZONE 5 MG PO TABS: ORAL_TABLET | ORAL | Status: DC

## 2013-08-02 MED ORDER — OXYCODONE-ACETAMINOPHEN 5-325 MG PO TABS: 2.0000 | ORAL_TABLET | Freq: Four times a day (QID) | ORAL | Status: DC | PRN

## 2013-08-02 NOTE — Patient Instructions (Addendum)
1. Take one metolazone once daily for three days.  2. Call the clinic if your weight increases more than 3 pounds in one day 3. I gave you scripts for your oxycodone for August and Sep. To continue to receive this medicine, you need to stop smoking, control your weight, and start exercising in your chair.  See me in 1 -2 months

## 2013-08-03 LAB — PRESCRIPTION ABUSE MONITORING 15P, URINE
Amphetamine/Meth: NEGATIVE ng/mL
Barbiturate Screen, Urine: NEGATIVE ng/mL
Benzodiazepine Screen, Urine: NEGATIVE ng/mL
Buprenorphine, Urine: NEGATIVE ng/mL
Cannabinoid Scrn, Ur: NEGATIVE ng/mL
Carisoprodol, Urine: NEGATIVE ng/mL
Cocaine Metabolites: NEGATIVE ng/mL
Creatinine, Urine: 237.74 mg/dL (ref >20.0)
Fentanyl, Ur: NEGATIVE ng/mL
Meperidine, Ur: NEGATIVE ng/mL
Methadone Screen, Urine: NEGATIVE ng/mL
Propoxyphene: NEGATIVE ng/mL
Tramadol Scrn, Ur: NEGATIVE ng/mL
Zolpidem, Urine: NEGATIVE ng/mL

## 2013-08-03 NOTE — Assessment & Plan Note (Signed)
Her systolic fxn is nl but she has had recent weight increases that responded to metolazone. She has e scales at home but the results are not sent to me and she doesn't record her weight and call me if > 3 lb weight gain in 24 hrs. I tasked her today that she must follow her weight, check it daily, records them, and call me if > 3 lb gain. I am Rxing three days of metolazone today. Checking BMP and meg today.

## 2013-08-03 NOTE — Assessment & Plan Note (Signed)
Still smoking but also uses the nicotine inhalers. I instructed her to not use both.

## 2013-08-03 NOTE — Assessment & Plan Note (Signed)
She is now off ABX. Getting wound care 2 times weekly.

## 2013-08-03 NOTE — Assessment & Plan Note (Signed)
This is one of her biggest issues. See above.

## 2013-08-03 NOTE — Assessment & Plan Note (Signed)
This occupied the vast majority of our time. She is unable to stand for more than 10 min without having to sit down 2/2 back pain. She feels that she is confined to her WC. She states that home PT "dismissed" her bc there was nothing more that they could do. I do not have those records. We repeated the MRI and there was no change from 2008 and no nerve impingement. She states that she was declared disabled 2/2 her back. She has gained 50 lbs in past 5 yrs and 18 lbs in past 6 months. She is on oxycodone 5 2 pills QID prn. She states that she usually takes 2 q 6 hrs and occasionally can go Q 8 hrs. She took on this AM and several the day prior. She has not been able to go a day without her oxycodone for over one yr. Usually takes 8 per day.   I am really not certain what is causing her back pain as her MRI is unimpressive. I stressed to her that additional weight will exacerbate back pain and that she really needed to work on controlling her weight. I stressed that PT would be key in her tx but since she states she was dismissed, I will get records before referring her. I also referred to pain therapy - maybe L4 and L5 would benefit from a steroid injection?   I stressed that a condition of receiving opioids, she needed to do her part which included controlling her weight, stopping smoking, doing chair exercises, weighing herself daily. At teh next visit, I need to change her from oxycodone to MS Contin as she has chronic pain. I checked a UDS today.  [facility] tx inc Lyrica, baclofen, and wellbutrin.

## 2013-08-03 NOTE — Assessment & Plan Note (Addendum)
She is checking about once daily but not QD. She did not take her insulin this AM. She takes 145 in the AM and 75 in the Pm. Her DM is poorly controlled. Her log indicates that only 6% are in target and she only had one low. She will need to return to further address.

## 2013-08-03 NOTE — Progress Notes (Signed)
  Subjective:    [name], female    DOB: 1961-10-16, 52 y.o.   MRN: [id number]  HPI  Please see the A&P for the status of the pt's chronic medical problems.  Review of Systems  Constitutional: Positive for unexpected weight change.  Musculoskeletal: Positive for back pain and gait problem.  Skin: Positive for wound.       Objective:   Physical Exam  Constitutional: She appears well-developed and well-nourished. She is cooperative. No distress.  Obese and chronically ill appearing  HENT:  Head: Normocephalic and atraumatic.  Right Ear: External ear normal.  Left Ear: External ear normal.  Nose: Nose normal.  Eyes: Conjunctivae and EOM are normal. Right eye exhibits no discharge. Left eye exhibits no discharge. No scleral icterus.  Pulmonary/Chest:  Able to speak in full sentences without dyspnea  Musculoskeletal: She exhibits edema and tenderness.  Changes of CVI - thickened skin, lichenification. One small tear. No erythema  Neurological: She is alert.  Skin: Skin is warm. She is not diaphoretic.  Psychiatric: She has a normal mood and affect. Her speech is normal and behavior is normal. Judgment and thought content normal. Cognition and memory are normal.          Assessment & Plan:

## 2013-08-04 ENCOUNTER — Other Ambulatory Visit (HOSPITAL_COMMUNITY): Payer: Self-pay | Admitting: Orthopedic Surgery

## 2013-08-04 DIAGNOSIS — I872 Venous insufficiency (chronic) (peripheral): Secondary | ICD-10-CM

## 2013-08-05 LAB — OXYCODONE, URINE (LC/MS-MS)
Noroxycodone, Ur: >2500 ng/mL
Oxycodone, ur: 1633 ng/mL
Oxymorphone: NEGATIVE ng/mL

## 2013-08-05 LAB — OPIATES/OPIOIDS (LC/MS-MS)
Codeine Urine: NEGATIVE ng/mL
Heroin (6-AM), UR: NEGATIVE ng/mL
Hydrocodone: NEGATIVE ng/mL
Hydromorphone: NEGATIVE ng/mL
Morphine Urine: NEGATIVE ng/mL
Norhydrocodone, Ur: NEGATIVE ng/mL
Noroxycodone, Ur: >2500 ng/mL
Oxycodone, ur: 1633 ng/mL
Oxymorphone: NEGATIVE ng/mL

## 2013-08-06 ENCOUNTER — Telehealth: Payer: Self-pay | Admitting: Internal Medicine

## 2013-08-06 NOTE — Telephone Encounter (Signed)
INTERNAL MEDICINE RESIDENCY PROGRAM After-Hours Telephone Call    Reason for call:   I received a call from [name] on 08/06/2013 at 8:32 AM. Patient states that she has a chronic CHF and is concerned about her weight.  She states that her weight was as follows.        Wed 08/03/13  283 lbs  Thurs 08/04/13  282.1 lbs  Friday 08/05/13  271.5 lbs  Sat 08/06/13  279.5 lbs           She states that she is feeling well.  Denies shortness of breath, orthopnea, or PND.  Denies any above symptoms with her daily routine activities.  She has at baseline bilateral ankle swelling, and she reports no worsening of her swelling.      Pertinent Data:   She was evaluated by Dr. Rogelia Boga for chronic osteomyelitis of foot, chronic pain syndrome, type 2 diabetes, severe obesity, tobacco use and chronic diastolic heart failure on 08/02/2013.  She was asymptomatic and her weight was 295 pounds (patient reports her weight was 284 lb at home) during office visit. It was noted by Dr. Rogelia Boga that she did not record her weight prior to this office visit and she was started on 3-day course of daily metolazone.  Patient states that she started her metolazone on Thursday 08/04/13.       Assessment / Plan / Recommendations:   # Chronic diastolic heart failure     Patient is asymptomatic now.  Denies SOB, Orthopnea or PND.     She was started on 3 day course of daily metolazone.  And her weight has been slowly trending down since her office visit on 08/02/13.  I suspect her weight of 271.5 pounds on Friday 08/05/13 was inaccurate.   Plan: - Continue current treatment of chronic diastolic heart failure. - Instruct patient to weight herself in a.m.and call me if there is weight gain -  pt is advised that if she develops shortness breath, orthopnea or PND or her swelling worsen or new symptoms arise, she should go to an urgent care facility or to to ER for further evaluation. - Patient agrees with above plan.     Dede Query, MD   08/06/2013, 8:32 AM

## 2013-08-08 ENCOUNTER — Ambulatory Visit (HOSPITAL_BASED_OUTPATIENT_CLINIC_OR_DEPARTMENT_OTHER)
Admission: RE | Admit: 2013-08-08 | Discharge: 2013-08-08 | Disposition: A | Discharge status: Home / Self Care | Payer: Medicare Other | Source: Ambulatory Visit | Attending: Cardiovascular Disease | Admitting: Cardiovascular Disease

## 2013-08-08 ENCOUNTER — Ambulatory Visit (HOSPITAL_COMMUNITY)
Admission: RE | Admit: 2013-08-08 | Discharge: 2013-08-08 | Disposition: A | Discharge status: Home / Self Care | Payer: Medicare Other | Source: Ambulatory Visit | Attending: Cardiovascular Disease | Admitting: Cardiovascular Disease

## 2013-08-08 DIAGNOSIS — M79609 Pain in unspecified limb: Secondary | ICD-10-CM

## 2013-08-08 DIAGNOSIS — L97509 Non-pressure chronic ulcer of other part of unspecified foot with unspecified severity: Secondary | ICD-10-CM

## 2013-08-08 DIAGNOSIS — M7989 Other specified soft tissue disorders: Secondary | ICD-10-CM

## 2013-08-08 DIAGNOSIS — I872 Venous insufficiency (chronic) (peripheral): Secondary | ICD-10-CM

## 2013-08-08 DIAGNOSIS — Z48812 Encounter for surgical aftercare following surgery on the circulatory system: Secondary | ICD-10-CM

## 2013-08-08 NOTE — Progress Notes (Signed)
ABI completed.  Henrick Mcgue, BS, RDMS, RVT  

## 2013-08-08 NOTE — Progress Notes (Signed)
Venous Insufficiency study completed. Positive for VI in both lower ext. Marilynne Halsted, BS, RDMS, RVT

## 2013-08-09 ENCOUNTER — Telehealth: Payer: Self-pay | Admitting: *Deleted

## 2013-08-09 NOTE — Telephone Encounter (Signed)
Call from [facility], [facility]. Labs were done - states A1C is 9.3 and LDL is 108. A copy will be mailed to the clinic and pt has already received her copy.

## 2013-08-16 ENCOUNTER — Other Ambulatory Visit: Payer: Self-pay | Admitting: Internal Medicine

## 2013-08-17 ENCOUNTER — Other Ambulatory Visit: Payer: Self-pay | Admitting: Licensed Clinical Social Worker

## 2013-08-17 DIAGNOSIS — G894 Chronic pain syndrome: Secondary | ICD-10-CM

## 2013-08-17 NOTE — Telephone Encounter (Signed)
I did not Rx the Metlozaone since I just filled it and is to be used prn for weight gain only.

## 2013-08-25 ENCOUNTER — Other Ambulatory Visit: Payer: Self-pay | Admitting: *Deleted

## 2013-08-25 ENCOUNTER — Other Ambulatory Visit: Payer: Self-pay | Admitting: Internal Medicine

## 2013-08-26 ENCOUNTER — Other Ambulatory Visit (HOSPITAL_COMMUNITY): Payer: Self-pay | Admitting: Orthopedic Surgery

## 2013-08-26 MED ORDER — BACLOFEN 10 MG PO TABS: 10.0000 mg | ORAL_TABLET | Freq: Three times a day (TID) | ORAL | Status: DC

## 2013-08-26 NOTE — Telephone Encounter (Signed)
I Rx'd 6 month supply of Lyrica on 08/16/13. Baclofen was sent in another refill request I Rx'd 30 Zaroxlyn on 08/02/13 to be taken 3 days at a time PRN. IF she has used all of them, she needs seen ASAP - today or in ED/UC

## 2013-08-29 ENCOUNTER — Encounter (HOSPITAL_COMMUNITY): Payer: Self-pay | Admitting: Respiratory Therapy

## 2013-08-29 NOTE — Telephone Encounter (Signed)
i have spoken w/ the pharm and left messages for pt, will await one more day for rtc

## 2013-08-29 NOTE — Telephone Encounter (Signed)
Pt has zaroxlyn left, she states she wants dr Midwife to know she is being admitted wed this week for surgery

## 2013-08-30 ENCOUNTER — Encounter (HOSPITAL_COMMUNITY): Payer: Self-pay | Admitting: *Deleted

## 2013-08-30 NOTE — Progress Notes (Signed)
Pt denies SOB, chest pain, and being under the care of a cardiologist. Pt states that she had a stress test at [facility] but can't remember when. Pt denies having a cardiac cath.

## 2013-08-31 ENCOUNTER — Encounter (HOSPITAL_COMMUNITY)
Admission: RE | Disposition: A | Discharge status: Home / Self Care | Payer: Self-pay | Source: Ambulatory Visit | Attending: Orthopedic Surgery

## 2013-08-31 ENCOUNTER — Ambulatory Visit (HOSPITAL_COMMUNITY)
Admission: RE | Admit: 2013-08-31 | Discharge: 2013-09-01 | Disposition: A | Discharge status: Home / Self Care | Payer: Medicare Other | Source: Ambulatory Visit | Attending: Orthopedic Surgery | Admitting: Orthopedic Surgery

## 2013-08-31 ENCOUNTER — Encounter (HOSPITAL_COMMUNITY): Payer: Self-pay | Admitting: Anesthesiology

## 2013-08-31 ENCOUNTER — Ambulatory Visit (HOSPITAL_COMMUNITY): Payer: Medicare Other | Admitting: Anesthesiology

## 2013-08-31 DIAGNOSIS — L97509 Non-pressure chronic ulcer of other part of unspecified foot with unspecified severity: Secondary | ICD-10-CM | POA: Insufficient documentation

## 2013-08-31 DIAGNOSIS — E1142 Type 2 diabetes mellitus with diabetic polyneuropathy: Secondary | ICD-10-CM | POA: Insufficient documentation

## 2013-08-31 DIAGNOSIS — I5032 Chronic diastolic (congestive) heart failure: Secondary | ICD-10-CM | POA: Insufficient documentation

## 2013-08-31 DIAGNOSIS — Z23 Encounter for immunization: Secondary | ICD-10-CM | POA: Insufficient documentation

## 2013-08-31 DIAGNOSIS — L02619 Cutaneous abscess of unspecified foot: Secondary | ICD-10-CM | POA: Insufficient documentation

## 2013-08-31 DIAGNOSIS — M86172 Other acute osteomyelitis, left ankle and foot: Secondary | ICD-10-CM

## 2013-08-31 DIAGNOSIS — E1159 Type 2 diabetes mellitus with other circulatory complications: Secondary | ICD-10-CM | POA: Insufficient documentation

## 2013-08-31 DIAGNOSIS — I10 Essential (primary) hypertension: Secondary | ICD-10-CM | POA: Insufficient documentation

## 2013-08-31 DIAGNOSIS — J4489 Other specified chronic obstructive pulmonary disease: Secondary | ICD-10-CM | POA: Insufficient documentation

## 2013-08-31 DIAGNOSIS — J449 Chronic obstructive pulmonary disease, unspecified: Secondary | ICD-10-CM | POA: Insufficient documentation

## 2013-08-31 DIAGNOSIS — M869 Osteomyelitis, unspecified: Secondary | ICD-10-CM | POA: Insufficient documentation

## 2013-08-31 DIAGNOSIS — L03119 Cellulitis of unspecified part of limb: Secondary | ICD-10-CM | POA: Insufficient documentation

## 2013-08-31 DIAGNOSIS — I798 Other disorders of arteries, arterioles and capillaries in diseases classified elsewhere: Secondary | ICD-10-CM | POA: Insufficient documentation

## 2013-08-31 DIAGNOSIS — Z79899 Other long term (current) drug therapy: Secondary | ICD-10-CM | POA: Insufficient documentation

## 2013-08-31 DIAGNOSIS — M9080 Osteopathy in diseases classified elsewhere, unspecified site: Secondary | ICD-10-CM | POA: Insufficient documentation

## 2013-08-31 DIAGNOSIS — Z794 Long term (current) use of insulin: Secondary | ICD-10-CM | POA: Insufficient documentation

## 2013-08-31 DIAGNOSIS — E1169 Type 2 diabetes mellitus with other specified complication: Secondary | ICD-10-CM | POA: Insufficient documentation

## 2013-08-31 DIAGNOSIS — I509 Heart failure, unspecified: Secondary | ICD-10-CM | POA: Insufficient documentation

## 2013-08-31 DIAGNOSIS — E1149 Type 2 diabetes mellitus with other diabetic neurological complication: Secondary | ICD-10-CM | POA: Insufficient documentation

## 2013-08-31 HISTORY — PX: AMPUTATION: SHX166

## 2013-08-31 HISTORY — PX: TOE AMPUTATION: SHX809

## 2013-08-31 HISTORY — DX: Heart failure, unspecified: I50.9

## 2013-08-31 HISTORY — DX: Other allergy status, other than to drugs and biological substances: Z91.09

## 2013-08-31 LAB — GLUCOSE, CAPILLARY
Glucose-Capillary: 172 mg/dL — ABNORMAL HIGH (ref 70–99)
Glucose-Capillary: 131 mg/dL — ABNORMAL HIGH (ref 70–99)
Glucose-Capillary: 183 mg/dL — ABNORMAL HIGH (ref 70–99)
Glucose-Capillary: 419 mg/dL — ABNORMAL HIGH (ref 70–99)

## 2013-08-31 LAB — CBC
HCT: 35.8 % — ABNORMAL LOW (ref 36.0–46.0)
Hemoglobin: 11.8 g/dL — ABNORMAL LOW (ref 12.0–15.0)
MCH: 30.1 pg (ref 26.0–34.0)
MCHC: 33 g/dL (ref 30.0–36.0)
MCV: 91.3 fL (ref 78.0–100.0)
Platelets: 231 10*3/uL (ref 150–400)
RBC: 3.92 MIL/uL (ref 3.87–5.11)
RDW: 14.7 % (ref 11.5–15.5)
WBC: 9.4 10*3/uL (ref 4.0–10.5)

## 2013-08-31 LAB — PROTIME-INR
INR: 1.19 (ref 0.00–1.49)
Prothrombin Time: 14.8 seconds (ref 11.6–15.2)

## 2013-08-31 LAB — APTT: aPTT: 36 seconds (ref 24–37)

## 2013-08-31 LAB — COMPREHENSIVE METABOLIC PANEL
ALT: 19 U/L (ref 0–35)
AST: 33 U/L (ref 0–37)
Albumin: 2.8 g/dL — ABNORMAL LOW (ref 3.5–5.2)
Alkaline Phosphatase: 79 U/L (ref 39–117)
BUN: 14 mg/dL (ref 6–23)
CO2: 28 mEq/L (ref 19–32)
Calcium: 9 mg/dL (ref 8.4–10.5)
Chloride: 98 mEq/L (ref 96–112)
Creatinine, Ser: 0.69 mg/dL (ref 0.50–1.10)
GFR calc Af Amer: >90 mL/min (ref >90)
GFR calc non Af Amer: >90 mL/min (ref >90)
Glucose, Bld: 155 mg/dL — ABNORMAL HIGH (ref 70–99)
Potassium: 3.2 mEq/L — ABNORMAL LOW (ref 3.5–5.1)
Sodium: 137 mEq/L (ref 135–145)
Total Bilirubin: 0.3 mg/dL (ref 0.3–1.2)
Total Protein: 7.1 g/dL (ref 6.0–8.3)

## 2013-08-31 SURGERY — AMPUTATION, FOOT, RAY
Anesthesia: General | Site: Foot | Laterality: Left | Wound class: Dirty or Infected

## 2013-08-31 MED ORDER — CEFAZOLIN SODIUM-DEXTROSE 2-3 GM-% IV SOLR
2.0000 g | INTRAVENOUS | Status: DC
  Filled 2013-08-31: qty 50

## 2013-08-31 MED ORDER — SIMVASTATIN 5 MG PO TABS
5.0000 mg | ORAL_TABLET | Freq: Every day | ORAL | Status: DC
  Administered 2013-08-31: 5 mg via ORAL
  Filled 2013-08-31 (×2): qty 1

## 2013-08-31 MED ORDER — 0.9 % SODIUM CHLORIDE (POUR BTL) OPTIME
TOPICAL | Status: DC | PRN
  Administered 2013-08-31: 1000 mL

## 2013-08-31 MED ORDER — CEFAZOLIN SODIUM 1-5 GM-% IV SOLN
INTRAVENOUS | Status: AC
  Filled 2013-08-31: qty 50

## 2013-08-31 MED ORDER — DIPHENHYDRAMINE HCL 50 MG/ML IJ SOLN
INTRAMUSCULAR | Status: AC
  Filled 2013-08-31: qty 1

## 2013-08-31 MED ORDER — PROPOFOL 10 MG/ML IV BOLUS
INTRAVENOUS | Status: DC | PRN
  Administered 2013-08-31: 180 mg via INTRAVENOUS

## 2013-08-31 MED ORDER — LACTATED RINGERS IV SOLN
INTRAVENOUS | Status: DC | PRN
  Administered 2013-08-31: 10:00:00 via INTRAVENOUS

## 2013-08-31 MED ORDER — DIPHENHYDRAMINE HCL 25 MG PO CAPS
25.0000 mg | ORAL_CAPSULE | Freq: Four times a day (QID) | ORAL | Status: DC
  Administered 2013-08-31 – 2013-09-01 (×2): 25 mg via ORAL
  Filled 2013-08-31 (×6): qty 1

## 2013-08-31 MED ORDER — INSULIN ASPART 100 UNIT/ML ~~LOC~~ SOLN: 0.0000 [IU] | Freq: Three times a day (TID) | SUBCUTANEOUS | Status: DC

## 2013-08-31 MED ORDER — ALBUTEROL SULFATE HFA 108 (90 BASE) MCG/ACT IN AERS
2.0000 | INHALATION_SPRAY | Freq: Four times a day (QID) | RESPIRATORY_TRACT | Status: DC | PRN
  Filled 2013-08-31: qty 6.7

## 2013-08-31 MED ORDER — PREGABALIN 50 MG PO CAPS
200.0000 mg | ORAL_CAPSULE | Freq: Three times a day (TID) | ORAL | Status: DC
  Administered 2013-08-31 – 2013-09-01 (×3): 200 mg via ORAL
  Filled 2013-08-31 (×3): qty 4

## 2013-08-31 MED ORDER — FUROSEMIDE 40 MG PO TABS
40.0000 mg | ORAL_TABLET | Freq: Two times a day (BID) | ORAL | Status: DC
Start: 2013-08-31 — End: 2013-09-01
  Administered 2013-08-31 – 2013-09-01 (×2): 40 mg via ORAL
  Filled 2013-08-31 (×4): qty 1

## 2013-08-31 MED ORDER — HYDROCODONE-ACETAMINOPHEN 5-325 MG PO TABS
1.0000 | ORAL_TABLET | ORAL | Status: DC | PRN
  Administered 2013-08-31 – 2013-09-01 (×2): 2 via ORAL
  Filled 2013-08-31 (×2): qty 2

## 2013-08-31 MED ORDER — OXYCODONE HCL 5 MG/5ML PO SOLN: 5.0000 mg | Freq: Once | ORAL | Status: DC | PRN

## 2013-08-31 MED ORDER — FENTANYL CITRATE 0.05 MG/ML IJ SOLN
INTRAMUSCULAR | Status: AC
  Administered 2013-08-31: 25 ug via INTRAVENOUS
  Filled 2013-08-31: qty 2

## 2013-08-31 MED ORDER — INSULIN ASPART 100 UNIT/ML ~~LOC~~ SOLN
0.0000 [IU] | Freq: Every day | SUBCUTANEOUS | Status: DC
  Administered 2013-08-31: 5 [IU] via SUBCUTANEOUS

## 2013-08-31 MED ORDER — METOCLOPRAMIDE HCL 5 MG/ML IJ SOLN: 5.0000 mg | Freq: Three times a day (TID) | INTRAMUSCULAR | Status: DC | PRN

## 2013-08-31 MED ORDER — ONDANSETRON HCL 4 MG/2ML IJ SOLN: 4.0000 mg | Freq: Four times a day (QID) | INTRAMUSCULAR | Status: DC | PRN

## 2013-08-31 MED ORDER — ONDANSETRON HCL 4 MG/2ML IJ SOLN
INTRAMUSCULAR | Status: DC | PRN
  Administered 2013-08-31: 4 mg via INTRAVENOUS

## 2013-08-31 MED ORDER — FENTANYL CITRATE 0.05 MG/ML IJ SOLN
INTRAMUSCULAR | Status: DC | PRN
  Administered 2013-08-31: 50 ug via INTRAVENOUS

## 2013-08-31 MED ORDER — OXYCODONE HCL 5 MG PO TABS: 5.0000 mg | ORAL_TABLET | Freq: Once | ORAL | Status: DC | PRN

## 2013-08-31 MED ORDER — FLUTICASONE PROPIONATE 50 MCG/ACT NA SUSP
2.0000 | Freq: Two times a day (BID) | NASAL | Status: DC | PRN
  Filled 2013-08-31: qty 16

## 2013-08-31 MED ORDER — ATENOLOL 100 MG PO TABS
100.0000 mg | ORAL_TABLET | Freq: Every morning | ORAL | Status: DC
Start: 2013-08-31 — End: 2013-09-01
  Administered 2013-09-01: 100 mg via ORAL
  Filled 2013-08-31 (×2): qty 1

## 2013-08-31 MED ORDER — INSULIN ASPART 100 UNIT/ML ~~LOC~~ SOLN
6.0000 [IU] | Freq: Three times a day (TID) | SUBCUTANEOUS | Status: DC
  Administered 2013-08-31 – 2013-09-01 (×3): 6 [IU] via SUBCUTANEOUS

## 2013-08-31 MED ORDER — TRAVOPROST 0.004 % OP SOLN: 1.0000 [drp] | Freq: Every day | OPHTHALMIC | Status: DC

## 2013-08-31 MED ORDER — MOMETASONE FURO-FORMOTEROL FUM 100-5 MCG/ACT IN AERO
2.0000 | INHALATION_SPRAY | Freq: Two times a day (BID) | RESPIRATORY_TRACT | Status: DC
  Administered 2013-08-31 – 2013-09-01 (×2): 2 via RESPIRATORY_TRACT
  Filled 2013-08-31: qty 8.8

## 2013-08-31 MED ORDER — SILVER SULFADIAZINE 1 % EX CREA
TOPICAL_CREAM | Freq: Every day | CUTANEOUS | Status: DC
  Administered 2013-09-01: 10:00:00 via TOPICAL
  Filled 2013-08-31: qty 85

## 2013-08-31 MED ORDER — BACLOFEN 10 MG PO TABS
10.0000 mg | ORAL_TABLET | Freq: Three times a day (TID) | ORAL | Status: DC
  Administered 2013-08-31 – 2013-09-01 (×3): 10 mg via ORAL
  Filled 2013-08-31 (×5): qty 1

## 2013-08-31 MED ORDER — TRAZODONE HCL 100 MG PO TABS
100.0000 mg | ORAL_TABLET | Freq: Every day | ORAL | Status: DC
  Administered 2013-08-31: 100 mg via ORAL
  Filled 2013-08-31 (×2): qty 1

## 2013-08-31 MED ORDER — ASPIRIN EC 325 MG PO TBEC
325.0000 mg | DELAYED_RELEASE_TABLET | Freq: Every day | ORAL | Status: DC
  Administered 2013-08-31 – 2013-09-01 (×2): 325 mg via ORAL
  Filled 2013-08-31 (×2): qty 1

## 2013-08-31 MED ORDER — SODIUM CHLORIDE 0.9 % IV SOLN: INTRAVENOUS | Status: DC

## 2013-08-31 MED ORDER — HYDROMORPHONE HCL PF 1 MG/ML IJ SOLN
0.5000 mg | INTRAMUSCULAR | Status: DC | PRN
  Administered 2013-08-31 (×2): 1 mg via INTRAVENOUS
  Filled 2013-08-31: qty 1

## 2013-08-31 MED ORDER — OXYCODONE-ACETAMINOPHEN 5-325 MG PO TABS
1.0000 | ORAL_TABLET | ORAL | Status: DC | PRN
  Administered 2013-08-31: 2 via ORAL
  Filled 2013-08-31: qty 2

## 2013-08-31 MED ORDER — CEFAZOLIN SODIUM-DEXTROSE 2-3 GM-% IV SOLR
2.0000 g | Freq: Four times a day (QID) | INTRAVENOUS | Status: AC
  Administered 2013-08-31 – 2013-09-01 (×3): 2 g via INTRAVENOUS
  Filled 2013-08-31 (×3): qty 50

## 2013-08-31 MED ORDER — ONDANSETRON HCL 4 MG PO TABS: 4.0000 mg | ORAL_TABLET | Freq: Four times a day (QID) | ORAL | Status: DC | PRN

## 2013-08-31 MED ORDER — INFLUENZA VAC SPLIT QUAD 0.5 ML IM SUSP
0.5000 mL | INTRAMUSCULAR | Status: AC
  Administered 2013-09-01: 0.5 mL via INTRAMUSCULAR
  Filled 2013-08-31: qty 0.5

## 2013-08-31 MED ORDER — HYDROMORPHONE HCL PF 1 MG/ML IJ SOLN
INTRAMUSCULAR | Status: AC
  Filled 2013-08-31: qty 1

## 2013-08-31 MED ORDER — LIDOCAINE HCL (CARDIAC) 20 MG/ML IV SOLN
INTRAVENOUS | Status: DC | PRN
  Administered 2013-08-31: 80 mg via INTRAVENOUS

## 2013-08-31 MED ORDER — IPRATROPIUM BROMIDE 0.02 % IN SOLN: 0.5000 mg | Freq: Four times a day (QID) | RESPIRATORY_TRACT | Status: DC | PRN

## 2013-08-31 MED ORDER — METOCLOPRAMIDE HCL 10 MG PO TABS: 5.0000 mg | ORAL_TABLET | Freq: Three times a day (TID) | ORAL | Status: DC | PRN

## 2013-08-31 MED ORDER — PANTOPRAZOLE SODIUM 40 MG PO TBEC
40.0000 mg | DELAYED_RELEASE_TABLET | Freq: Every day | ORAL | Status: DC
  Administered 2013-09-01: 40 mg via ORAL
  Filled 2013-08-31: qty 1

## 2013-08-31 MED ORDER — BENAZEPRIL HCL 40 MG PO TABS
40.0000 mg | ORAL_TABLET | Freq: Every day | ORAL | Status: DC
  Administered 2013-08-31 – 2013-09-01 (×2): 40 mg via ORAL
  Filled 2013-08-31 (×2): qty 1

## 2013-08-31 MED ORDER — BUPROPION HCL ER (SR) 150 MG PO TB12
150.0000 mg | ORAL_TABLET | Freq: Two times a day (BID) | ORAL | Status: DC
  Administered 2013-08-31 – 2013-09-01 (×2): 150 mg via ORAL
  Filled 2013-08-31 (×4): qty 1

## 2013-08-31 MED ORDER — METFORMIN HCL 500 MG PO TABS
1000.0000 mg | ORAL_TABLET | Freq: Two times a day (BID) | ORAL | Status: DC
  Administered 2013-08-31 – 2013-09-01 (×2): 1000 mg via ORAL
  Filled 2013-08-31 (×4): qty 2

## 2013-08-31 MED ORDER — INSULIN ASPART 100 UNIT/ML ~~LOC~~ SOLN
0.0000 [IU] | Freq: Three times a day (TID) | SUBCUTANEOUS | Status: DC
  Administered 2013-08-31: 4 [IU] via SUBCUTANEOUS
  Administered 2013-09-01: 11 [IU] via SUBCUTANEOUS
  Administered 2013-09-01: 15 [IU] via SUBCUTANEOUS

## 2013-08-31 MED ORDER — DIPHENHYDRAMINE HCL 50 MG/ML IJ SOLN
25.0000 mg | Freq: Once | INTRAMUSCULAR | Status: AC
  Administered 2013-08-31: 25 mg via INTRAVENOUS

## 2013-08-31 MED ORDER — ALBUTEROL SULFATE (5 MG/ML) 0.5% IN NEBU
2.5000 mg | INHALATION_SOLUTION | Freq: Four times a day (QID) | RESPIRATORY_TRACT | Status: DC | PRN
Start: 2013-08-31 — End: 2013-09-01

## 2013-08-31 MED ORDER — LATANOPROST 0.005 % OP SOLN
1.0000 [drp] | Freq: Every day | OPHTHALMIC | Status: DC
  Administered 2013-08-31: 1 [drp] via OPHTHALMIC
  Filled 2013-08-31: qty 2.5

## 2013-08-31 MED ORDER — FENTANYL CITRATE 0.05 MG/ML IJ SOLN
25.0000 ug | INTRAMUSCULAR | Status: DC | PRN
  Administered 2013-08-31 (×2): 25 ug via INTRAVENOUS

## 2013-08-31 SURGICAL SUPPLY — 41 items
BANDAGE ESMARK 6X9 LF (GAUZE/BANDAGES/DRESSINGS) IMPLANT
BANDAGE GAUZE ELAST BULKY 4 IN (GAUZE/BANDAGES/DRESSINGS) ×1 IMPLANT
BLADE SAW SGTL MED 73X18.5 STR (BLADE) IMPLANT
BNDG CMPR 9X6 STRL LF SNTH (GAUZE/BANDAGES/DRESSINGS)
BNDG COHESIVE 4X5 TAN STRL (GAUZE/BANDAGES/DRESSINGS) ×2 IMPLANT
BNDG COHESIVE 6X5 TAN STRL LF (GAUZE/BANDAGES/DRESSINGS) ×2 IMPLANT
BNDG ESMARK 6X9 LF (GAUZE/BANDAGES/DRESSINGS)
CLOTH BEACON ORANGE TIMEOUT ST (SAFETY) ×2 IMPLANT
CUFF TOURNIQUET SINGLE 34IN LL (TOURNIQUET CUFF) IMPLANT
CUFF TOURNIQUET SINGLE 44IN (TOURNIQUET CUFF) IMPLANT
DRAPE U-SHAPE 47X51 STRL (DRAPES) ×4 IMPLANT
DRSG ADAPTIC 3X8 NADH LF (GAUZE/BANDAGES/DRESSINGS) ×2 IMPLANT
DRSG PAD ABDOMINAL 8X10 ST (GAUZE/BANDAGES/DRESSINGS) ×1 IMPLANT
DURAPREP 26ML APPLICATOR (WOUND CARE) ×2 IMPLANT
ELECT REM PT RETURN 9FT ADLT (ELECTROSURGICAL) ×2
ELECTRODE REM PT RTRN 9FT ADLT (ELECTROSURGICAL) ×1 IMPLANT
GLOVE BIOGEL PI IND STRL 9 (GLOVE) ×1 IMPLANT
GLOVE BIOGEL PI INDICATOR 9 (GLOVE) ×1
GLOVE SURG ORTHO 9.0 STRL STRW (GLOVE) ×2 IMPLANT
GOWN PREVENTION PLUS XLARGE (GOWN DISPOSABLE) ×2 IMPLANT
GOWN SRG XL XLNG 56XLVL 4 (GOWN DISPOSABLE) ×1 IMPLANT
GOWN STRL NON-REIN XL XLG LVL4 (GOWN DISPOSABLE) ×2
KIT BASIN OR (CUSTOM PROCEDURE TRAY) ×2 IMPLANT
KIT ROOM TURNOVER OR (KITS) ×2 IMPLANT
MANIFOLD NEPTUNE II (INSTRUMENTS) ×2 IMPLANT
NS IRRIG 1000ML POUR BTL (IV SOLUTION) ×2 IMPLANT
PACK ORTHO EXTREMITY (CUSTOM PROCEDURE TRAY) ×2 IMPLANT
PAD ARMBOARD 7.5X6 YLW CONV (MISCELLANEOUS) ×4 IMPLANT
PAD CAST 4YDX4 CTTN HI CHSV (CAST SUPPLIES) ×1 IMPLANT
PADDING CAST COTTON 4X4 STRL (CAST SUPPLIES) ×2
SPONGE GAUZE 4X4 12PLY (GAUZE/BANDAGES/DRESSINGS) ×2 IMPLANT
SPONGE LAP 18X18 X RAY DECT (DISPOSABLE) ×2 IMPLANT
STAPLER VISISTAT 35W (STAPLE) ×2 IMPLANT
STOCKINETTE IMPERVIOUS LG (DRAPES) IMPLANT
SUCTION FRAZIER TIP 10 FR DISP (SUCTIONS) ×2 IMPLANT
SUT ETHILON 2 0 PSLX (SUTURE) ×4 IMPLANT
TOWEL OR 17X24 6PK STRL BLUE (TOWEL DISPOSABLE) ×2 IMPLANT
TOWEL OR 17X26 10 PK STRL BLUE (TOWEL DISPOSABLE) ×2 IMPLANT
TUBE CONNECTING 12X1/4 (SUCTIONS) ×2 IMPLANT
UNDERPAD 30X30 INCONTINENT (UNDERPADS AND DIAPERS) ×2 IMPLANT
WATER STERILE IRR 1000ML POUR (IV SOLUTION) ×2 IMPLANT

## 2013-08-31 NOTE — Anesthesia Procedure Notes (Signed)
Procedure Name: Intubation Date/Time: 08/31/2013 10:52 AM Performed by: Lovie Chol Pre-anesthesia Checklist: Patient identified, Emergency Drugs available, Suction available, Patient being monitored and Timeout performed Patient Re-evaluated:Patient Re-evaluated prior to inductionOxygen Delivery Method: Circle system utilized Preoxygenation: Pre-oxygenation with 100% oxygen Intubation Type: IV induction LMA: LMA inserted LMA Size: 5.0 Number of attempts: 1 Placement Confirmation: positive ETCO2,  CO2 detector and breath sounds checked- equal and bilateral Tube secured with: Tape Dental Injury: Teeth and Oropharynx as per pre-operative assessment

## 2013-08-31 NOTE — Transfer of Care (Signed)
Immediate Anesthesia Transfer of Care Note  Patient: Jennifer [name]  Procedure(s) Performed: Procedure(s) with comments: AMPUTATION RAY (Left) - Left Foot 5th Ray Amputation  Patient Location: PACU  Anesthesia Type:General  Level of Consciousness: awake, alert , oriented and patient cooperative  Airway & Oxygen Therapy: Patient Spontanous Breathing and Patient connected to nasal cannula oxygen  Post-op Assessment: Report given to PACU RN and Post -op Vital signs reviewed and stable  Post vital signs: Reviewed and stable  Complications: No apparent anesthesia complications

## 2013-08-31 NOTE — Op Note (Signed)
OPERATIVE REPORT  DATE OF SURGERY: 08/31/2013  PATIENT:  Jennifer [name],  52 y.o. female  PRE-OPERATIVE DIAGNOSIS:  Osteomyelitis Left Foot 5th MTH  POST-OPERATIVE DIAGNOSIS:  Osteomyelitis Left Foot 5th MTH  PROCEDURE:  Procedure(s): AMPUTATION RAY Left foot fifth metatarsal. Left foot fourth metatarsal. Local tissue rearrangement to close tissue defect 3 x 5 cm.  SURGEON:  Surgeon(s): Nadara Mustard, MD  ANESTHESIA:   general  EBL:  min ML  SPECIMEN:  No Specimen  TOURNIQUET:  * No tourniquets in log *  PROCEDURE DETAILS: Patient is a 53 year old woman with diabetic insensate neuropathy with osteomyelitis of the fifth metatarsal with a large ulcer. Patient presents at this time for foot salvage surgery for fourth and fifth ray amputation and local tissue rearrangement. Risks and benefits were discussed including infection neurovascular injury nonhealing of the wound need for higher level amputation. Patient states she understands and wished to proceed at this time. Description of procedure patient brought to the operating room and underwent a general anesthetic. After adequate levels and anesthesia obtained patient's left lower extremity was prepped using DuraPrep and draped into a sterile field. An incision was made around the ulcer in the fifth metatarsal. This was resected in one block of tissue through the base of the fifth metatarsal. The base was left intact to allow for function of the peroneus brevis. With only the fifth metatarsal resected there was still necrotic tissue which extended down to the fourth metatarsals of the fourth metatarsal was also resected with the fourth toe. Prune was irrigated with normal saline hemostasis was obtained there was good petechial bleeding and no signs of any deep infection after the fourth ray was resected. The local tissue was then rearrangement for wound closure of a wound 5 x 3 cm. 2-0 nylon was used for wound closure. The wound was covered  Adaptic orthopedic sponges AB dressing Kerlix and Coban. A compressive wrap was applied from the tibial tubercle distally due to the significant venostasis deficiency. Patient was extubated taken to the PACU in stable condition. Plan for overnight observation.  PLAN OF CARE: Admit for overnight observation  PATIENT DISPOSITION:  PACU - hemodynamically stable.   Nadara Mustard, MD 08/31/2013 11:20 AM

## 2013-08-31 NOTE — H&P (Signed)
Jennifer [name] is an 52 y.o. female.   Chief Complaint: Osteomyelitis ulceration left foot fifth metatarsal head HPI: Patient is a 52 year old woman with diabetic insensate neuropathy peripheral vascular disease status post revascularization who presents with ulceration abscess osteomyelitis left foot fifth metatarsal head.  Past Medical History  Diagnosis Date  . Depression   . GERD (gastroesophageal reflux disease)   . Hyperlipidemia   . Hypertension   . Glaucoma   . PVD (peripheral vascular disease) with claudication     Stents to bilateral common iliac arteries (left 2005, right 2008), on chronic plavix  . Diabetic peripheral neuropathy   . Hyperplastic colon polyp 12/2010    Per colonoscopy (12/2010) - Dr. Arlyce Dice  . Diverticulosis   . Asthma   . COPD 01/08/2007    PFT's 05/2007 : FEV1/FVC 82, FEV1 64% pred, FEF 25-75% 40% predicted, 16% improvement in FEV1 with bronchodilators.     . Type II diabetes mellitus with peripheral circulatory disorders, uncontrolled (250.72) DX: 1993    Insulin dep. Poor control. Complicated by diabetic foot ulcer and diabetic eye disease.    . Tobacco abuse   . Chronic osteomyelitis of foot     chronic, right secondary to diabetic foot ulcers  . Polymicrobial bacterial infection 01/2013    GBS and S. aureus bacteremia // Source likely infected diabetic foot ulcer  . 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 Blood cultures 01/26/13 S. Aureus and GBS. Blood cultures Feb 6th, 8th, and 9th and March were negative.Repeat TEE 3/20 negative for vegitations  . 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 Chronic diastolic heart failure     grade 2 per 2D echocardiogram (01/2013)  . Ulcer of foot, chronic     Left. No OM per MRI (01/2013)  . DVT of upper extremity (deep vein thrombosis) 03/11/2013    Secondary to PICC line. Right brachial vein, diagnosed on 03/10/2013 Coumadin for 3 months. End date  06/10/2013   . Chronic pain syndrome 12/03/2011    Likely secondary to depression, "fibromyalgia", neuropathy, and obesity. Lumbar MRI 2014 no sig change from prior (2008) : Stable hypertrophic facet disease most notable at L4-5. Stable shallow left foraminal/extraforaminal disc protrusion at L4-5. No direct neural compression.      . CHF (congestive heart failure)   . Environmental allergies     Hx: of    Past Surgical History  Procedure Laterality Date  . Abdominal hysterectomy  1997    secondary to uterine fibroids  . Tubal ligation    . Rotator cuff repair      bilateral  . Breast biopsy      multiple-benign per pt  . Wrist surgery      right  . Ganglion cyst excision      multiple  . Other surgical history      stents in lower ext  . Tee without cardioversion N/A 01/31/2013    Procedure: TRANSESOPHAGEAL ECHOCARDIOGRAM (TEE);  Surgeon: Pricilla Riffle, MD;  Location: [facility] ENDOSCOPY;  Service: Cardiovascular;  Laterality: N/A;  Rm [phone]  . Bladder surgery      bladder reconstruction surgery  . Tee without cardioversion N/A 03/10/2013    Procedure: TRANSESOPHAGEAL ECHOCARDIOGRAM (TEE);  Surgeon: Laurey Morale, MD;  Location: [facility] ENDOSCOPY;  Service: Cardiovascular;  Laterality: N/A;  Rm. [phone]  . Skin grafts Bilateral 05/13/2013    Dr Lajoyce Corners  . Skin split graft Bilateral 05/13/2013  Procedure: Right and Left Foot Allograft Skin Graft;  Surgeon: Nadara Mustard, MD;  Location: [facility] OR;  Service: Orthopedics;  Laterality: Bilateral;  Right and Left Foot Allograft Skin Graft    Family History  Problem Relation Age of Onset  . Diverticulosis Mother   . Diabetes Mother   . Hypertension Mother   . Congestive Heart Failure Mother   . Asthma Father    Social History:  reports that she has been smoking Cigarettes.  She has a 20 pack-year smoking history. She has never used smokeless tobacco. She reports that she uses illicit drugs (Marijuana and "Crack" cocaine). She reports that she does not  drink alcohol.  Allergies:  Allergies  Allergen Reactions  . Iohexol      Desc: IV CONTRAST CAUSE NEPHROPATHY IN 2007   . Ivp Dye [Iodinated Diagnostic Agents]   . Morphine Sulfate Itching and Rash    No prescriptions prior to admission    No results found for this or any previous visit (from the past 48 hour(s)). No results found.  Review of Systems  All other systems reviewed and are negative.    Height 5\' 7"  (1.702 m), weight 122.925 kg (271 lb). Physical Exam  On examination patient has palpable pulses. 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  Plan: Will plan for fifth ray amputation. Risks and benefits were discussed including infection nonhealing of the wound need for additional surgery. Patient states she understands and wished to proceed at this time.  Dmani Mizer V 08/31/2013, 6:55 AM

## 2013-08-31 NOTE — Anesthesia Preprocedure Evaluation (Addendum)
Anesthesia Evaluation  Patient identified by MRN, date of birth, ID band Patient awake    Reviewed: Allergy & Precautions, H&P , NPO status , Patient's Chart, lab work & pertinent test results, reviewed documented beta blocker date and time   History of Anesthesia Complications Negative for: history of anesthetic complications  Airway Mallampati: III TM Distance: >3 FB Neck ROM: full    Dental  (+) Edentulous Upper, Edentulous Lower and Dental Advisory Given   Pulmonary asthma , COPD COPD inhaler, Current Smoker,  breath sounds clear to auscultation        Cardiovascular hypertension, Pt. on medications and Pt. on home beta blockers + Peripheral Vascular Disease and +CHF Rhythm:Regular Rate:Normal     Neuro/Psych Depression  Neuromuscular disease    GI/Hepatic Neg liver ROS, GERD-  Medicated and Controlled,  Endo/Other  diabetes, Type 2, Insulin DependentMorbid obesity  Renal/GU negative Renal ROS     Musculoskeletal negative musculoskeletal ROS (+)   Abdominal   Peds  Hematology   Anesthesia Other Findings   Reproductive/Obstetrics negative OB ROS                          Anesthesia Physical Anesthesia Plan  ASA: III  Anesthesia Plan: General   Post-op Pain Management:    Induction: Intravenous  Airway Management Planned: LMA  Additional Equipment:   Intra-op Plan:   Post-operative Plan:   Informed Consent: I have reviewed the patients History and Physical, chart, labs and discussed the procedure including the risks, benefits and alternatives for the proposed anesthesia with the patient or authorized representative who has indicated his/her understanding and acceptance.     Plan Discussed with: CRNA, Anesthesiologist and Surgeon  Anesthesia Plan Comments:         Anesthesia Quick Evaluation

## 2013-08-31 NOTE — Progress Notes (Signed)
Orthopedic Tech Progress Note Patient Details:  Jennifer [name] 17-Jun-1961 [id number]  Ortho Devices Type of Ortho Device: Postop shoe/boot;Crutches Ortho Device/Splint Location: (B) RLE-LLE Ortho Device/Splint Interventions: Ordered;Application;Adjustment   [name] 08/31/2013, 3:55 PM

## 2013-08-31 NOTE — Preoperative (Signed)
Beta Blockers   Reason not to administer Beta Blockers:Not Applicable 

## 2013-08-31 NOTE — Progress Notes (Signed)
Pt's cbg at hs was 419. She is eating large amounts of food and was drinking a regular coke. Orders received from Dr. Ophelia Charter to start HS correction insulin. Pt is alert and oriented.

## 2013-08-31 NOTE — Anesthesia Postprocedure Evaluation (Signed)
Anesthesia Post Note  Patient: Jennifer [name]  Procedure(s) Performed: Procedure(s) (LRB): AMPUTATION RAY (Left)  Anesthesia type: General  Patient location: PACU  Post pain: Pain level controlled and Adequate analgesia  Post assessment: Post-op Vital signs reviewed, Patient's Cardiovascular Status Stable, Respiratory Function Stable, Patent Airway and Pain level controlled  Last Vitals:  Filed Vitals:   08/31/13 1130  BP: 127/54  Pulse: 66  Temp: 36.4 C  Resp: 15    Post vital signs: Reviewed and stable  Level of consciousness: awake, alert  and oriented  Complications: No apparent anesthesia complications

## 2013-09-01 LAB — GLUCOSE, CAPILLARY
Glucose-Capillary: 307 mg/dL — ABNORMAL HIGH (ref 70–99)
Glucose-Capillary: 289 mg/dL — ABNORMAL HIGH (ref 70–99)

## 2013-09-01 MED ORDER — WHITE PETROLATUM GEL
Status: AC
  Administered 2013-09-01: 09:00:00
  Filled 2013-09-01: qty 5

## 2013-09-01 NOTE — Evaluation (Signed)
Physical Therapy Evaluation Patient Details Name: Jennifer [name] MRN: [id number] DOB: 14-Mar-1961 Today's Date: 09/01/2013 Time: 4098-1191 PT Time Calculation (min): 21 min  PT Assessment / Plan / Recommendation History of Present Illness  s/p 4th & 5th ray amputation Lt LE due to osteomyleitis and diabetic neuropathy   Clinical Impression  Patient is s/p Lt 4th&5th ray amputation surgery resulting in functional limitations due to the deficits listed below (see PT Problem List). Patient will benefit from skilled PT to increase their independence and safety with mobility to allow discharge to the venue listed below. Pt is unsteady with gt but insists on going home with son today. Encouraged pt to use wheelchair to enter house and avoid steps due to difficulty maintaining TWB status on Lt LE. Pt encouraged to have (A) with mobility. Pt is a fall risk. Will benefit from OT consult to address deficits with ADLs. Recommend HHPT upon acute D/C.     PT Assessment  Patient needs continued PT services    Follow Up Recommendations  Home health PT;Supervision/Assistance - 24 hour    Does the patient have the potential to tolerate intense rehabilitation      Barriers to Discharge   pt has steps in front of house but reports she can be wheeled in her wheelchair to the back where she does not have steps and her son can help her enter     Equipment Recommendations  None recommended by PT    Recommendations for Other Services OT consult   Frequency Min 4X/week    Precautions / Restrictions Precautions Precautions: Fall Precaution Comments: pt denies recent falls Required Braces or Orthoses: Other Brace/Splint Other Brace/Splint: post op shoe  Restrictions Weight Bearing Restrictions: Yes LLE Weight Bearing: Non weight bearing   Pertinent Vitals/Pain 10/10       Mobility  Bed Mobility Bed Mobility: Not assessed Details for Bed Mobility Assistance: pt sitting EOB and returned to recliner   Transfers Transfers: Sit to Stand;Stand to Sit Sit to Stand: From bed;4: Min guard Stand to Sit: 4: Min guard;To chair/3-in-1;With armrests;With upper extremity assist Details for Transfer Assistance: pt min guard for safety and to balance; cues for hand placement and safety with RW Ambulation/Gait Ambulation/Gait Assistance: 4: Min guard Ambulation Distance (Feet): 20 Feet Assistive device: Rolling walker Ambulation/Gait Assistance Details: cues for gt sequencing and safety; pt unsteady and demo balance deficits; requires 2 standing rest breaks due to back pain; cues to maintain TWB status on Lt LE; pt WB through Lt heel primarily for stability  Gait Pattern: Step-to pattern;Decreased stance time - left;Decreased step length - right;Trunk flexed;Wide base of support Gait velocity: decr due to pain  General Gait Details: pt requesting to attempt to amb with crutches; pt unsteady; recommend RW and wheelchair upon D/C due to balance deficits; pt demo decr safety awareness with gt and is a fall risk Stairs: No (pt states she can go in wheelchair to backdoor without steps) Wheelchair Mobility Wheelchair Mobility: No         PT Diagnosis: Acute pain;Abnormality of gait  PT Problem List: Decreased balance;Decreased mobility;Decreased safety awareness;Pain PT Treatment Interventions: DME instruction;Gait training;Functional mobility training;Therapeutic activities;Therapeutic exercise;Balance training;Neuromuscular re-education;Patient/family education     PT Goals(Current goals can be found in the care plan section) Acute Rehab PT Goals Patient Stated Goal: tog o home today PT Goal Formulation: With patient Time For Goal Achievement: 09/03/13 Potential to Achieve Goals: Good  Visit Information  Last PT Received On: 09/01/13 Assistance Needed: +  1 History of Present Illness: s/p 4th & 5th ray amputation Lt LE due to osteomyleitis and diabetic neuropathy        Prior Functioning  Home  Living Family/patient expects to be discharged to:: Private residence Available Help at Discharge: Family;Available 24 hours/day Type of Home: House Home Access: Stairs to enter [employer] of Steps: 6 Entrance Stairs-Rails: Left Home Layout: One level Home Equipment: Walker - 2 wheels;Cane - quad;Bedside commode;Crutches;Wheelchair - manual Additional Comments: pt reports she can go around to backdoor and does not have any steps  Prior Function Level of Independence: Independent with assistive device(s) Communication Communication: No difficulties Dominant Hand: Right    Cognition  Cognition Arousal/Alertness: Awake/alert Behavior During Therapy: WFL for tasks assessed/performed Overall Cognitive Status: Within Functional Limits for tasks assessed    Extremity/Trunk Assessment Upper Extremity Assessment Upper Extremity Assessment: Defer to OT evaluation Lower Extremity Assessment Lower Extremity Assessment: Overall WFL for tasks assessed Cervical / Trunk Assessment Cervical / Trunk Assessment: Kyphotic   Balance Balance Balance Assessed: Yes Static Sitting Balance Static Sitting - Balance Support: No upper extremity supported;Feet supported Static Sitting - Level of Assistance: 6: Modified independent (Device/Increase time)  End of Session PT - End of Session Equipment Utilized During Treatment: Gait belt;Other (comment) (post op shoes ) Activity Tolerance: Patient tolerated treatment well Patient left: in chair;with call bell/phone within reach Nurse Communication: Mobility status  GP Functional Assessment Tool Used: clincal judgement  Functional Limitation: Mobility: Walking and moving around Mobility: Walking and Moving Around Current Status (Z6109): At least 1 percent but less than 20 percent impaired, limited or restricted Mobility: Walking and Moving Around Goal Status [phone]): 0 percent impaired, limited or restricted Mobility: Walking and Moving Around  Discharge Status [phone]): At least 1 percent but less than 20 percent impaired, limited or restricted   Donell Sievert, [place] [phone] 09/01/2013, 10:40 AM

## 2013-09-01 NOTE — Progress Notes (Signed)
Patient discharged to home accompanied by family. Discharge instructions and rx given and explained by Aura Camps. Patient stated understanding. Patients IV was removed and she left in a stable condition via wheelchair.

## 2013-09-01 NOTE — Discharge Summary (Signed)
  Final diagnoses diabetic insensate neuropathy with osteomyelitis fifth metatarsal left foot. Surgical procedure left foot fourth and fifth ray amputation. Discharge to home in stable condition.

## 2013-09-01 NOTE — Discharge Instructions (Signed)
Touchdown weightbearing left foot keep foot elevated keep dressing clean dry and intact.

## 2013-09-02 ENCOUNTER — Encounter (HOSPITAL_COMMUNITY): Payer: Self-pay | Admitting: Orthopedic Surgery

## 2013-09-07 ENCOUNTER — Other Ambulatory Visit (HOSPITAL_COMMUNITY): Payer: Medicare Other

## 2013-09-08 ENCOUNTER — Telehealth: Payer: Self-pay | Admitting: *Deleted

## 2013-09-08 NOTE — Telephone Encounter (Signed)
[name] [facility] rn calls and leaves a message that she needs to speak to someone by tomorrow in RE to pt, i returned her call and got vmail, left message for rtc.  Also spoke w/ pt and she states the nurse has questions as to why she is on the insulin she is on and why she has not been told about the classes at [facility] for [facility], pt was told that she has donnaP. Available for 1 on 1 education but certainly she is welcome to go to the general diabetic classes through [facility], she was encouraged to speak w/ donnap.

## 2013-09-09 NOTE — Telephone Encounter (Signed)
When the "nurse" calls back you amy explain that Ms Sing is on the insulin that she is on bc it is all that I can do to get her to take 2 shots of insulin per day and check her CBG at least once a day. Using a Lantus / Novolog regimen would require at least four shots a day and at least 4 CBG's per day I do not see Ms Symonds adhering to that. And there is no reason to send her to a different DM educator - [name] is in clinic and will communicate with me. I have also set up with [facility]

## 2013-09-13 ENCOUNTER — Encounter: Payer: Self-pay | Admitting: Internal Medicine

## 2013-09-15 ENCOUNTER — Telehealth: Payer: Self-pay | Admitting: *Deleted

## 2013-09-15 ENCOUNTER — Other Ambulatory Visit: Payer: Self-pay | Admitting: Internal Medicine

## 2013-09-15 DIAGNOSIS — E1159 Type 2 diabetes mellitus with other circulatory complications: Secondary | ICD-10-CM

## 2013-09-15 DIAGNOSIS — I5032 Chronic diastolic (congestive) heart failure: Secondary | ICD-10-CM

## 2013-09-15 NOTE — Telephone Encounter (Signed)
Pt called to make [place] aware of weight gain,  Yesterday's wt - 280 and today's weight 283  She does not feel SOB and does not note any additional swelling. Pt # [id number]  Pt states Dr Audrie Lia office called and wanted to see her today.  Her calf pain is improved so she declined.  She will call him back if pain returns.

## 2013-09-15 NOTE — Telephone Encounter (Signed)
Would you pls sch with Dr Clyde Lundborg at 3 PM? Usually, I just give three days of metolazone but her last K was 3.2 when she was admitted for her most recent amputation. It does not appear that it was repeleted and she was not D/C'd on KCl. I do not want to do more aggressive diuresis without knowing her K. If it is possible, would she come in at [facility] for stat labs so that Dr Clyde Lundborg has those results?   Dr Clyde Lundborg - if you feel HF referral is indicated, I am OK with that. Otherwise, I will be happy to cont to manage it.

## 2013-09-15 NOTE — Telephone Encounter (Signed)
Call from Onalee Hua, Physical Therapist with [facility] -  # [phone] Onalee Hua is in home with pt to do physical therapy and pt c/o pain and throbbing to left calf.  Rates pain 5/10, area is swollen but this is not new. 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    Denies SOB and no redness noted. Pt is not supposed to bear weight on that foot. Pt is taking ASA 81 mg.  No coumadin.  I suggested they call Dr Lajoyce Corners, and ask if he wants to order dopplers or see pt. Please advise

## 2013-09-15 NOTE — Telephone Encounter (Signed)
Pt will come in at 1400 for labs. Then appointment

## 2013-09-16 ENCOUNTER — Inpatient Hospital Stay (HOSPITAL_COMMUNITY)
Admission: EM | Admit: 2013-09-16 | Discharge: 2013-09-21 | DRG: 292 | Disposition: A | Discharge status: Home / Self Care | Payer: Medicare Other | Attending: Internal Medicine | Admitting: Internal Medicine

## 2013-09-16 ENCOUNTER — Encounter (HOSPITAL_COMMUNITY): Payer: Self-pay | Admitting: *Deleted

## 2013-09-16 ENCOUNTER — Encounter: Payer: Self-pay | Admitting: Internal Medicine

## 2013-09-16 ENCOUNTER — Ambulatory Visit (INDEPENDENT_AMBULATORY_CARE_PROVIDER_SITE_OTHER): Payer: Medicare Other | Admitting: Internal Medicine

## 2013-09-16 ENCOUNTER — Emergency Department (HOSPITAL_COMMUNITY): Payer: Medicare Other

## 2013-09-16 ENCOUNTER — Other Ambulatory Visit: Payer: Self-pay

## 2013-09-16 VITALS — BP 160/80 | HR 70 | Temp 97.0°F | Ht 67.0 in | Wt 283.0 lb

## 2013-09-16 DIAGNOSIS — E785 Hyperlipidemia, unspecified: Secondary | ICD-10-CM

## 2013-09-16 DIAGNOSIS — J449 Chronic obstructive pulmonary disease, unspecified: Secondary | ICD-10-CM

## 2013-09-16 DIAGNOSIS — R079 Chest pain, unspecified: Secondary | ICD-10-CM

## 2013-09-16 DIAGNOSIS — K2960 Other gastritis without bleeding: Secondary | ICD-10-CM | POA: Diagnosis present

## 2013-09-16 DIAGNOSIS — K219 Gastro-esophageal reflux disease without esophagitis: Secondary | ICD-10-CM

## 2013-09-16 DIAGNOSIS — E1139 Type 2 diabetes mellitus with other diabetic ophthalmic complication: Secondary | ICD-10-CM | POA: Diagnosis present

## 2013-09-16 DIAGNOSIS — E1149 Type 2 diabetes mellitus with other diabetic neurological complication: Secondary | ICD-10-CM | POA: Diagnosis present

## 2013-09-16 DIAGNOSIS — M79609 Pain in unspecified limb: Secondary | ICD-10-CM

## 2013-09-16 DIAGNOSIS — J4489 Other specified chronic obstructive pulmonary disease: Secondary | ICD-10-CM

## 2013-09-16 DIAGNOSIS — Z9851 Tubal ligation status: Secondary | ICD-10-CM

## 2013-09-16 DIAGNOSIS — K3184 Gastroparesis: Secondary | ICD-10-CM | POA: Diagnosis present

## 2013-09-16 DIAGNOSIS — M79662 Pain in left lower leg: Secondary | ICD-10-CM | POA: Insufficient documentation

## 2013-09-16 DIAGNOSIS — I5032 Chronic diastolic (congestive) heart failure: Secondary | ICD-10-CM

## 2013-09-16 DIAGNOSIS — I10 Essential (primary) hypertension: Secondary | ICD-10-CM

## 2013-09-16 DIAGNOSIS — I110 Hypertensive heart disease with heart failure: Principal | ICD-10-CM | POA: Diagnosis present

## 2013-09-16 DIAGNOSIS — H409 Unspecified glaucoma: Secondary | ICD-10-CM | POA: Diagnosis present

## 2013-09-16 DIAGNOSIS — Z9119 Patient's noncompliance with other medical treatment and regimen: Secondary | ICD-10-CM

## 2013-09-16 DIAGNOSIS — Z91041 Radiographic dye allergy status: Secondary | ICD-10-CM

## 2013-09-16 DIAGNOSIS — M7989 Other specified soft tissue disorders: Secondary | ICD-10-CM

## 2013-09-16 DIAGNOSIS — G894 Chronic pain syndrome: Secondary | ICD-10-CM

## 2013-09-16 DIAGNOSIS — F17200 Nicotine dependence, unspecified, uncomplicated: Secondary | ICD-10-CM | POA: Diagnosis present

## 2013-09-16 DIAGNOSIS — Z6841 Body Mass Index (BMI) 40.0 and over, adult: Secondary | ICD-10-CM

## 2013-09-16 DIAGNOSIS — E1169 Type 2 diabetes mellitus with other specified complication: Secondary | ICD-10-CM | POA: Diagnosis present

## 2013-09-16 DIAGNOSIS — Z91199 Patient's noncompliance with other medical treatment and regimen due to unspecified reason: Secondary | ICD-10-CM

## 2013-09-16 DIAGNOSIS — Z86718 Personal history of other venous thrombosis and embolism: Secondary | ICD-10-CM

## 2013-09-16 DIAGNOSIS — E1165 Type 2 diabetes mellitus with hyperglycemia: Secondary | ICD-10-CM | POA: Diagnosis present

## 2013-09-16 DIAGNOSIS — F1410 Cocaine abuse, uncomplicated: Secondary | ICD-10-CM | POA: Diagnosis present

## 2013-09-16 DIAGNOSIS — F1210 Cannabis abuse, uncomplicated: Secondary | ICD-10-CM | POA: Diagnosis present

## 2013-09-16 DIAGNOSIS — R0789 Other chest pain: Secondary | ICD-10-CM | POA: Diagnosis present

## 2013-09-16 DIAGNOSIS — Z888 Allergy status to other drugs, medicaments and biological substances status: Secondary | ICD-10-CM

## 2013-09-16 DIAGNOSIS — M9080 Osteopathy in diseases classified elsewhere, unspecified site: Secondary | ICD-10-CM | POA: Diagnosis present

## 2013-09-16 DIAGNOSIS — Z794 Long term (current) use of insulin: Secondary | ICD-10-CM

## 2013-09-16 DIAGNOSIS — I5033 Acute on chronic diastolic (congestive) heart failure: Secondary | ICD-10-CM

## 2013-09-16 DIAGNOSIS — L97509 Non-pressure chronic ulcer of other part of unspecified foot with unspecified severity: Secondary | ICD-10-CM | POA: Diagnosis present

## 2013-09-16 DIAGNOSIS — E1159 Type 2 diabetes mellitus with other circulatory complications: Secondary | ICD-10-CM

## 2013-09-16 DIAGNOSIS — K229 Disease of esophagus, unspecified: Secondary | ICD-10-CM

## 2013-09-16 DIAGNOSIS — E118 Type 2 diabetes mellitus with unspecified complications: Secondary | ICD-10-CM | POA: Diagnosis present

## 2013-09-16 DIAGNOSIS — S98139A Complete traumatic amputation of one unspecified lesser toe, initial encounter: Secondary | ICD-10-CM

## 2013-09-16 DIAGNOSIS — M86679 Other chronic osteomyelitis, unspecified ankle and foot: Secondary | ICD-10-CM | POA: Diagnosis present

## 2013-09-16 DIAGNOSIS — E1142 Type 2 diabetes mellitus with diabetic polyneuropathy: Secondary | ICD-10-CM | POA: Diagnosis present

## 2013-09-16 DIAGNOSIS — I70219 Atherosclerosis of native arteries of extremities with intermittent claudication, unspecified extremity: Secondary | ICD-10-CM | POA: Diagnosis present

## 2013-09-16 DIAGNOSIS — E1151 Type 2 diabetes mellitus with diabetic peripheral angiopathy without gangrene: Secondary | ICD-10-CM | POA: Diagnosis present

## 2013-09-16 DIAGNOSIS — R1310 Dysphagia, unspecified: Secondary | ICD-10-CM | POA: Diagnosis present

## 2013-09-16 DIAGNOSIS — E11621 Type 2 diabetes mellitus with foot ulcer: Secondary | ICD-10-CM | POA: Diagnosis present

## 2013-09-16 DIAGNOSIS — I509 Heart failure, unspecified: Secondary | ICD-10-CM

## 2013-09-16 DIAGNOSIS — F3289 Other specified depressive episodes: Secondary | ICD-10-CM | POA: Diagnosis present

## 2013-09-16 DIAGNOSIS — IMO0002 Reserved for concepts with insufficient information to code with codable children: Secondary | ICD-10-CM | POA: Diagnosis present

## 2013-09-16 DIAGNOSIS — R197 Diarrhea, unspecified: Secondary | ICD-10-CM | POA: Diagnosis present

## 2013-09-16 DIAGNOSIS — Z833 Family history of diabetes mellitus: Secondary | ICD-10-CM

## 2013-09-16 DIAGNOSIS — J441 Chronic obstructive pulmonary disease with (acute) exacerbation: Secondary | ICD-10-CM | POA: Diagnosis present

## 2013-09-16 DIAGNOSIS — Z8249 Family history of ischemic heart disease and other diseases of the circulatory system: Secondary | ICD-10-CM

## 2013-09-16 DIAGNOSIS — E669 Obesity, unspecified: Secondary | ICD-10-CM | POA: Diagnosis present

## 2013-09-16 DIAGNOSIS — E119 Type 2 diabetes mellitus without complications: Secondary | ICD-10-CM | POA: Diagnosis present

## 2013-09-16 DIAGNOSIS — R0600 Dyspnea, unspecified: Secondary | ICD-10-CM

## 2013-09-16 DIAGNOSIS — Z825 Family history of asthma and other chronic lower respiratory diseases: Secondary | ICD-10-CM

## 2013-09-16 DIAGNOSIS — F329 Major depressive disorder, single episode, unspecified: Secondary | ICD-10-CM | POA: Diagnosis present

## 2013-09-16 HISTORY — DX: Fatty (change of) liver, not elsewhere classified: K76.0

## 2013-09-16 LAB — GLUCOSE, CAPILLARY: Glucose-Capillary: 128 mg/dL — ABNORMAL HIGH (ref 70–99)

## 2013-09-16 LAB — POCT I-STAT TROPONIN I
Troponin i, poc: 0 ng/mL (ref 0.00–0.08)
Troponin i, poc: 0 ng/mL (ref 0.00–0.08)

## 2013-09-16 LAB — D-DIMER, QUANTITATIVE: D-Dimer, Quant: 1.38 ug/mL-FEU — ABNORMAL HIGH (ref 0.00–0.48)

## 2013-09-16 LAB — BASIC METABOLIC PANEL WITH GFR
BUN: 10 mg/dL (ref 6–23)
CO2: 31 mEq/L (ref 19–32)
Calcium: 9 mg/dL (ref 8.4–10.5)
Chloride: 104 mEq/L (ref 96–112)
Creat: 0.77 mg/dL (ref 0.50–1.10)
GFR, Est African American: >89 mL/min
GFR, Est Non African American: 88 mL/min
Glucose, Bld: 131 mg/dL — ABNORMAL HIGH (ref 70–99)
Potassium: 3.7 mEq/L (ref 3.5–5.3)
Sodium: 146 mEq/L — ABNORMAL HIGH (ref 135–145)

## 2013-09-16 LAB — CBC
HCT: 38.3 % (ref 36.0–46.0)
Hemoglobin: 12.5 g/dL (ref 12.0–15.0)
MCH: 30.5 pg (ref 26.0–34.0)
MCHC: 32.6 g/dL (ref 30.0–36.0)
MCV: 93.4 fL (ref 78.0–100.0)
Platelets: 279 10*3/uL (ref 150–400)
RBC: 4.1 MIL/uL (ref 3.87–5.11)
RDW: 15.4 % (ref 11.5–15.5)
WBC: 10.9 10*3/uL — ABNORMAL HIGH (ref 4.0–10.5)

## 2013-09-16 LAB — BASIC METABOLIC PANEL
BUN: 11 mg/dL (ref 6–23)
CO2: 33 mEq/L — ABNORMAL HIGH (ref 19–32)
Calcium: 9.2 mg/dL (ref 8.4–10.5)
Chloride: 101 mEq/L (ref 96–112)
Creatinine, Ser: 0.79 mg/dL (ref 0.50–1.10)
GFR calc Af Amer: >90 mL/min (ref >90)
GFR calc non Af Amer: >90 mL/min (ref >90)
Glucose, Bld: 57 mg/dL — ABNORMAL LOW (ref 70–99)
Potassium: 3.3 mEq/L — ABNORMAL LOW (ref 3.5–5.1)
Sodium: 144 mEq/L (ref 135–145)

## 2013-09-16 LAB — POCT GLYCOSYLATED HEMOGLOBIN (HGB A1C): Hemoglobin A1C: 8

## 2013-09-16 LAB — PRO B NATRIURETIC PEPTIDE: Pro B Natriuretic peptide (BNP): 256.9 pg/mL — ABNORMAL HIGH (ref 0–125)

## 2013-09-16 LAB — MAGNESIUM: Magnesium: 1.3 mg/dL — ABNORMAL LOW (ref 1.5–2.5)

## 2013-09-16 MED ORDER — IPRATROPIUM BROMIDE 0.02 % IN SOLN
0.5000 mg | Freq: Once | RESPIRATORY_TRACT | Status: AC
  Administered 2013-09-16: 0.5 mg via RESPIRATORY_TRACT
  Filled 2013-09-16: qty 2.5

## 2013-09-16 MED ORDER — ASPIRIN 81 MG PO CHEW
324.0000 mg | CHEWABLE_TABLET | Freq: Once | ORAL | Status: AC
  Filled 2013-09-16: qty 4

## 2013-09-16 MED ORDER — TECHNETIUM TO 99M ALBUMIN AGGREGATED
6.0000 | Freq: Once | INTRAVENOUS | Status: AC | PRN
  Administered 2013-09-16: 6 via INTRAVENOUS

## 2013-09-16 MED ORDER — METHYLPREDNISOLONE SODIUM SUCC 125 MG IJ SOLR
80.0000 mg | Freq: Once | INTRAMUSCULAR | Status: AC
  Administered 2013-09-16: 80 mg via INTRAVENOUS
  Filled 2013-09-16: qty 2

## 2013-09-16 MED ORDER — NITROGLYCERIN 0.4 MG SL SUBL
0.4000 mg | SUBLINGUAL_TABLET | SUBLINGUAL | Status: AC | PRN
  Administered 2013-09-17 (×3): 0.4 mg via SUBLINGUAL

## 2013-09-16 MED ORDER — GI COCKTAIL ~~LOC~~
30.0000 mL | Freq: Once | ORAL | Status: AC
  Administered 2013-09-17: 30 mL via ORAL
  Filled 2013-09-16: qty 30

## 2013-09-16 MED ORDER — TECHNETIUM TC 99M DIETHYLENETRIAME-PENTAACETIC ACID: 40.0000 | Freq: Once | INTRAVENOUS | Status: AC | PRN

## 2013-09-16 MED ORDER — FENTANYL CITRATE 0.05 MG/ML IJ SOLN
75.0000 ug | Freq: Once | INTRAMUSCULAR | Status: AC
  Administered 2013-09-17: 75 ug via INTRAVENOUS
  Filled 2013-09-16: qty 2

## 2013-09-16 MED ORDER — ALBUTEROL SULFATE (5 MG/ML) 0.5% IN NEBU
5.0000 mg | INHALATION_SOLUTION | Freq: Once | RESPIRATORY_TRACT | Status: AC
  Administered 2013-09-16: 5 mg via RESPIRATORY_TRACT
  Filled 2013-09-16: qty 1

## 2013-09-16 MED ORDER — SODIUM CHLORIDE 0.9 % IV BOLUS (SEPSIS)
500.0000 mL | Freq: Once | INTRAVENOUS | Status: AC
  Administered 2013-09-16: 500 mL via INTRAVENOUS

## 2013-09-16 NOTE — Progress Notes (Signed)
*  Preliminary Results* Left lower extremity venous duplex completed. Left lower extremity is negative for deep vein thrombosis. There is no evidence of left Baker's cyst.  09/16/2013 6:39 PM  Gertie Fey, RVT, RDCS, RDMS

## 2013-09-16 NOTE — Assessment & Plan Note (Signed)
Patient has a diastolic congestive heart failure. She reports that her body weight increased by 3 pounds in the past several days. It is difficult to assess her JVD due to obesity. She has 1-2 pitting leg edema (the left is worse than the right). It is likely that the patient has mild congestive heart failure exacerbation. Her stat BMP showed K 3.7 and magnesium level is 1.3. We initially thought to treat the patient by escalating the diuretics, such as adding metolazone for few days per Dr. Rogelia Boga. After discussed with Dr. Dalphine Handing, we finally decided to transfer patient to the ED given her new chest pain and severe tenderness over left calf area concerning for DVT.   -patient may need chest pain rule-out work up -Mg repletion by IV

## 2013-09-16 NOTE — Assessment & Plan Note (Addendum)
Etiology is not clear currently. Differential diagnoses include, ACS (patient has significant risk factors, including hypertension and diabetes. She reports that her chest wall pain is different with her ongoing chest pain), pulmonary embolism (patient is suspected to have a left leg DVT due to severe calf pain), and musculoskeletal pain (she has chest wall tenderness). I discussed with Dr. Dalphine Handing. Due to multiple ongoing issues, we decided to transfer patient to the ED for further workup.

## 2013-09-16 NOTE — ED Notes (Signed)
Orders for d-dimer chest and doppler per dr Anitra Lauth

## 2013-09-16 NOTE — Progress Notes (Signed)
Patient ID: UNIKA NAZARENO, female   DOB: [birth year], 52 y.o.   MRN: [id number] Subjective:   Patient ID: KARMIN KASPRZAK female   DOB: Sep 14, 1961 52 y.o.   MRN: [id number]  CC:   Acute visit due to chest pain and pain over the left calf area.  HPI:  Ms.Kania L Petrich is a 52 y.o. lady with past medical history as outlined below, who present for an acute visit today.  1. Left calf pain: Patient reports that she has severe pain over the calf area on the left leg. She said that her leg pain is not new, but has been worsening in the past 4 days. She had LE doppler done on 08/08/13, which did not have DVT. She also has asymmetric leg edema in the legs (L>R). Of note, patient had left fourth and fifth toe amputation on 9/10. After she went home from hospital, she has been doing well. She was told by home RN that her wound is healing well. She does not have fever or chills.  3. Chest pain: Patient reports that in the past several days she has been having new chest pain. The chest pain is located substernal area , radiating to the neck. It is associated with shortness of breath. It is intermittent. It  happens 3-5 times each day, each episode lasted for about 10-15 minutes. She denies any symptoms of GERD, such as heart burning. She reports that she had 2 episodes of chest pain today. She is not very sure whether the chest pain is exertional, since she is on wheelchair and does not move much. She has chest wall tenderness, but she said this pain is different with her ongoing chest pain.   2. CHF: Patient's 2-D echo on 01/26/13 showed EF 60-65% with grade 2 diastolic dysfunction. The repeated 2-D echo on 04/06/13 showed EF 60-65%. Patient is currently taking Lasix 40 mg twice a day. She is also on aspirin,  atenolol 100 mg daily and Lotensin 40 mg daily. He reports that her body weight increased by 3 pounds in the past several days. Her leg edema is getting worse.  ROS:  Denies fever, chills, headaches, abdominal  pain, diarrhea, constipation, dysuria, urgency, frequency, hematuria.   Past Medical History  Diagnosis Date  . Depression   . GERD (gastroesophageal reflux disease)   . Hyperlipidemia   . Hypertension   . Glaucoma   . PVD (peripheral vascular disease) with claudication     Stents to bilateral common iliac arteries (left 2005, right 2008), on chronic plavix  . Diabetic peripheral neuropathy   . Hyperplastic colon polyp 12/2010    Per colonoscopy (12/2010) - Dr. Arlyce Dice  . Diverticulosis   . Asthma   . COPD 01/08/2007    PFT's 05/2007 : FEV1/FVC 82, FEV1 64% pred, FEF 25-75% 40% predicted, 16% improvement in FEV1 with bronchodilators.     . Type II diabetes mellitus with peripheral circulatory disorders, uncontrolled (250.72) DX: 1993    Insulin dep. Poor control. Complicated by diabetic foot ulcer and diabetic eye disease.    . Tobacco abuse   . Chronic osteomyelitis of foot     chronic, right secondary to diabetic foot ulcers  . Polymicrobial bacterial infection 01/2013    GBS and S. aureus bacteremia // Source likely infected diabetic foot ulcer  . 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 Blood cultures 01/26/13 S. Aureus and GBS. Blood cultures Feb 6th, 8th, and 9th  and March were negative.Repeat TEE 3/20 negative for vegitations  . 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 Chronic diastolic heart failure     grade 2 per 2D echocardiogram (01/2013)  . Ulcer of foot, chronic     Left. No OM per MRI (01/2013)  . DVT of upper extremity (deep vein thrombosis) 03/11/2013    Secondary to PICC line. Right brachial vein, diagnosed on 03/10/2013 Coumadin for 3 months. End date 06/10/2013   . Chronic pain syndrome 12/03/2011    Likely secondary to depression, "fibromyalgia", neuropathy, and obesity. Lumbar MRI 2014 no sig change from prior (2008) : Stable hypertrophic facet disease most notable at L4-5. Stable shallow left  foraminal/extraforaminal disc protrusion at L4-5. No direct neural compression.      . CHF (congestive heart failure)   . Environmental allergies     Hx: of   Current Outpatient Prescriptions  Medication Sig Dispense Refill  . albuterol (PROAIR HFA) 108 (90 BASE) MCG/ACT inhaler Inhale 2 puffs into the lungs every 6 (six) hours as needed for wheezing.  3 Inhaler  1  . albuterol (PROVENTIL) (5 MG/ML) 0.5% nebulizer solution Take 0.5 mLs (2.5 mg total) by nebulization every 6 (six) hours as needed for wheezing or shortness of breath.  20 mL  5  . aspirin EC 81 MG tablet Take 1 tablet (81 mg total) by mouth every morning.  90 tablet  1  . atenolol (TENORMIN) 100 MG tablet Take 1 tablet (100 mg total) by mouth every morning.  90 tablet  1  . baclofen (LIORESAL) 10 MG tablet Take 1 tablet (10 mg total) by mouth 3 (three) times daily.  20 each  1  . benazepril (LOTENSIN) 40 MG tablet Take 40 mg by mouth daily.      Marland Kitchen buPROPion (WELLBUTRIN SR) 150 MG 12 hr tablet Take 1 tablet (150 mg total) by mouth 2 (two) times daily.  180 tablet  1  . diphenhydrAMINE (BENADRYL) 25 MG tablet Take 25 mg by mouth every 6 (six) hours.       Marland Kitchen esomeprazole (NEXIUM) 20 MG capsule Take 1 capsule (20 mg total) by mouth daily before breakfast.  90 capsule  1  . fluticasone (FLONASE) 50 MCG/ACT nasal spray Place 2 sprays into the nose 2 (two) times daily as needed for allergies.  48 g  1  . Fluticasone-Salmeterol (ADVAIR DISKUS) 250-50 MCG/DOSE AEPB Inhale 1 puff into the lungs 2 (two) times daily.  180 each  1  . furosemide (LASIX) 40 MG tablet Take 40 mg by mouth 2 (two) times daily. Take 1 tablet in the morning and 1 in the afternoon      . glucose blood test strip Check blood sugar up to 4 times a day before meals and bedtime Dx code- 250.72, insulin requiring  150 each  12  . insulin lispro protamine-lispro (HUMALOG 75/25) (75-25) 100 UNIT/ML SUSP injection Inject 75-140 Units into the skin 2 (two) times daily with a  meal. Inject 140 units in the morning and 75 units in the evening      . ipratropium (ATROVENT) 0.02 % nebulizer solution Take 2.5 mLs (0.5 mg total) by nebulization every 6 (six) hours as needed for wheezing.  75 mL  1  . metFORMIN (GLUCOPHAGE) 1000 MG tablet Take 1 tablet (1,000 mg total) by mouth 2 (two) times daily.  180 tablet  1  . ONETOUCH DELICA LANCETS FINE MISC 1 each by Does not apply  route 2 (two) times daily. Check blood sugar up to 4 times a day before meals and bedtime Dx code- 250.72, insulin requiring From 03/03/2013  100 each  12  . oxyCODONE-acetaminophen (ROXICET) 5-325 MG per tablet Take 2 tablets by mouth every 6 (six) hours as needed for pain.  240 tablet  0  . pravastatin (PRAVACHOL) 40 MG tablet Take 1 tablet (40 mg total) by mouth every morning.  90 tablet  1  . pregabalin (LYRICA) 200 MG capsule Take 200 mg by mouth 3 (three) times daily.      . silver sulfADIAZINE (SILVADENE) 1 % cream Apply 1 application topically daily.       . travoprost, benzalkonium, (TRAVATAN) 0.004 % ophthalmic solution 1 drop at bedtime.      . traZODone (DESYREL) 100 MG tablet Take 1 tablet (100 mg total) by mouth at bedtime.  90 tablet  1  . glucose blood (ONETOUCH VERIO) test strip Check blood sugar up to 4 times a day before meals and bedtime Dx code- 250.72 insulin-requiring  150 each  12   No current facility-administered medications for this visit.   Family History  Problem Relation Age of Onset  . Diverticulosis Mother   . Diabetes Mother   . Hypertension Mother   . Congestive Heart Failure Mother   . Asthma Father    History   Social History  . Marital Status: Single    Spouse Name: N/A    Number of Children: 2  . Years of Education: college   Occupational History  . Disability     previously worked as a Psychologist, counselling History Main Topics  . Smoking status: Current Every Day Smoker -- 0.50 packs/day for 40 years    Types: Cigarettes  . Smokeless tobacco: Never Used      Comment: Half pack or less. Smokes anpack per week.      '  I already have information on quitting "  . Alcohol Use: No  . Drug Use: Yes    Special: Marijuana, "Crack" cocaine     Comment: none in many years  . Sexual Activity: None     Comment: states she is currently trying to quit smoking   Other Topics Concern  . None   Social History Narrative   On disability. Lives with son in [place]. 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    Boyfriend passed away stage 4 cancer 03-07-13.    Review of Systems: Full 14-point review of systems otherwise negative. See HPI.   Objective:  Physical Exam: Filed Vitals:   09/16/13 1615  BP: 160/80  Pulse: 70  Temp: 97 F (36.1 C)  TempSrc: Oral  Height: 5\' 7"  (1.702 m)  Weight: 283 lb (128.368 kg)  SpO2: 98%   Constitutional: Vital signs reviewed.  Patient is a well-developed and well-nourished, in no acute distress and cooperative with exam.   HEENT:  Head: Normocephalic and atraumatic Eyes: PERRL, EOMI, conjunctivae normal, No scleral icterus.  Neck: Supple, no bruit, difficult to assess JVD due to obesity  Cardiovascular: RRR, S1 normal, S2 normal, no MRG, pulses symmetric and intact bilaterally Pulmonary/Chest: CTAB, no wheezes, rales, or rhonchi Chest wall: there is tenderness over front chest wall.  Abdominal: Soft. Non-tender, non-distended, bowel sounds are normal, no masses, organomegaly, or guarding present.  Musculoskeletal: No joint deformities, erythema, or stiffness, ROM full and non-tender Extremities: 1 to 2+ pitting, leg edema (L>R). Left foot wound is well dressed, clear and dry, no discharge.  Chronic venous stasis changes of skin. No blisters seen. Severe tenderness over the left calf area, no cord detected.  Hematology: no cervical, inginal, or axillary adenopathy.  Neurological: A&O x3, Strength is normal and symmetric bilaterally, cranial nerve II-XII are grossly intact, no focal motor deficit, sensory intact to light touch  bilaterally.  Assessment & Plan:

## 2013-09-16 NOTE — ED Provider Notes (Signed)
CSN: [id number]     Arrival date & time 09/16/13  1634 History   First MD Initiated Contact with Patient 09/16/13 2020     Chief Complaint  Patient presents with  . Chest Pain   (Consider location/radiation/quality/duration/timing/severity/associated sxs/prior Treatment) HPI Comments: 52 yo female with DM, HTN, COPD, PVD, Obesity, smoking, toe amputation presents with sob and chest pain for the past week, constant, ache, non radiating, no hx of similar. Pt has had mild worsening leg swelling, worse on the left.  Pt had recent surgery.  No pe or dvt hx.  Pt unsure if sob similar to previous copd or CHF.  No cardiologist.  Mild chest discomfort constant for one week.  No cardiac mi or cad known.  Nothing improves.  Pt has gerd hx.   The history is provided by the patient.    Past Medical History  Diagnosis Date  . Depression   . GERD (gastroesophageal reflux disease)   . Hyperlipidemia   . Hypertension   . Glaucoma   . PVD (peripheral vascular disease) with claudication     Stents to bilateral common iliac arteries (left 2005, right 2008), on chronic plavix  . Diabetic peripheral neuropathy   . Hyperplastic colon polyp 12/2010    Per colonoscopy (12/2010) - Dr. Arlyce Dice  . Diverticulosis   . Asthma   . COPD 01/08/2007    PFT's 05/2007 : FEV1/FVC 82, FEV1 64% pred, FEF 25-75% 40% predicted, 16% improvement in FEV1 with bronchodilators.     . Type II diabetes mellitus with peripheral circulatory disorders, uncontrolled (250.72) DX: 1993    Insulin dep. Poor control. Complicated by diabetic foot ulcer and diabetic eye disease.    . Tobacco abuse   . Chronic osteomyelitis of foot     chronic, right secondary to diabetic foot ulcers  . Polymicrobial bacterial infection 01/2013    GBS and S. aureus bacteremia // Source likely infected diabetic foot ulcer  . 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 Blood cultures 01/26/13 S. Aureus and  GBS. Blood cultures Feb 6th, 8th, and 9th and March were negative.Repeat TEE 3/20 negative for vegitations  . 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 Chronic diastolic heart failure     grade 2 per 2D echocardiogram (01/2013)  . Ulcer of foot, chronic     Left. No OM per MRI (01/2013)  . DVT of upper extremity (deep vein thrombosis) 03/11/2013    Secondary to PICC line. Right brachial vein, diagnosed on 03/10/2013 Coumadin for 3 months. End date 06/10/2013   . Chronic pain syndrome 12/03/2011    Likely secondary to depression, "fibromyalgia", neuropathy, and obesity. Lumbar MRI 2014 no sig change from prior (2008) : Stable hypertrophic facet disease most notable at L4-5. Stable shallow left foraminal/extraforaminal disc protrusion at L4-5. No direct neural compression.      . CHF (congestive heart failure)   . Environmental allergies     Hx: of   Past Surgical History  Procedure Laterality Date  . Abdominal hysterectomy  1997    secondary to uterine fibroids  . Tubal ligation    . Rotator cuff repair      bilateral  . Breast biopsy      multiple-benign per pt  . Wrist surgery      right  . Ganglion cyst excision      multiple  . Other surgical history      stents  in lower ext  . Tee without cardioversion N/A 01/31/2013    Procedure: TRANSESOPHAGEAL ECHOCARDIOGRAM (TEE);  Surgeon: Pricilla Riffle, MD;  Location: [facility] ENDOSCOPY;  Service: Cardiovascular;  Laterality: N/A;  Rm [phone]  . Bladder surgery      bladder reconstruction surgery  . Tee without cardioversion N/A 03/10/2013    Procedure: TRANSESOPHAGEAL ECHOCARDIOGRAM (TEE);  Surgeon: Laurey Morale, MD;  Location: [facility] ENDOSCOPY;  Service: Cardiovascular;  Laterality: N/A;  Rm. [id number]  . Skin grafts Bilateral 05/13/2013    Dr Lajoyce Corners  . Skin split graft Bilateral 05/13/2013    Procedure: Right and Left Foot Allograft Skin Graft;  Surgeon: Nadara Mustard, MD;  Location: [facility] OR;  Service: Orthopedics;  Laterality: Bilateral;  Right  and Left Foot Allograft Skin Graft  . Toe amputation Left 08/31/2013    4TH & 5 TH TOE   . Amputation Left 08/31/2013    Procedure: AMPUTATION RAY;  Surgeon: Nadara Mustard, MD;  Location: [facility] OR;  Service: Orthopedics;  Laterality: Left;  Left Foot 5th Ray Amputation   Family History  Problem Relation Age of Onset  . Diverticulosis Mother   . Diabetes Mother   . Hypertension Mother   . Congestive Heart Failure Mother   . Asthma Father    History  Substance Use Topics  . Smoking status: Current Every Day Smoker -- 0.50 packs/day for 40 years    Types: Cigarettes  . Smokeless tobacco: Never Used     Comment: Half pack or less. Smokes anpack per week.      '  I already have information on quitting "  . Alcohol Use: No   OB History   Grav Para Term Preterm Abortions TAB SAB Ect Mult Living                 Review of Systems  Constitutional: Positive for fatigue. Negative for fever and chills.  HENT: Negative for neck pain and neck stiffness.   Eyes: Negative for visual disturbance.  Respiratory: Positive for cough and shortness of breath.   Cardiovascular: Positive for chest pain and leg swelling.  Gastrointestinal: Negative for vomiting and abdominal pain.  Genitourinary: Negative for dysuria and flank pain.  Musculoskeletal: Negative for back pain.  Skin: Negative for rash.  Neurological: Negative for light-headedness and headaches.    Allergies  Iohexol; Ivp dye; and Morphine sulfate  Home Medications   Current Outpatient Rx  Name  Route  Sig  Dispense  Refill  . albuterol (PROAIR HFA) 108 (90 BASE) MCG/ACT inhaler   Inhalation   Inhale 2 puffs into the lungs every 6 (six) hours as needed for wheezing.   3 Inhaler   1   . albuterol (PROVENTIL) (5 MG/ML) 0.5% nebulizer solution   Nebulization   Take 0.5 mLs (2.5 mg total) by nebulization every 6 (six) hours as needed for wheezing or shortness of breath.   20 mL   5   . aspirin EC 81 MG tablet   Oral   Take 1  tablet (81 mg total) by mouth every morning.   90 tablet   1   . atenolol (TENORMIN) 100 MG tablet   Oral   Take 1 tablet (100 mg total) by mouth every morning.   90 tablet   1   . baclofen (LIORESAL) 10 MG tablet   Oral   Take 1 tablet (10 mg total) by mouth 3 (three) times daily.   20 each   1   .  benazepril (LOTENSIN) 40 MG tablet   Oral   Take 40 mg by mouth daily.         Marland Kitchen buPROPion (WELLBUTRIN SR) 150 MG 12 hr tablet   Oral   Take 1 tablet (150 mg total) by mouth 2 (two) times daily.   180 tablet   1   . diphenhydrAMINE (BENADRYL) 25 MG tablet   Oral   Take 25 mg by mouth every 6 (six) hours.          Marland Kitchen esomeprazole (NEXIUM) 20 MG capsule   Oral   Take 1 capsule (20 mg total) by mouth daily before breakfast.   90 capsule   1   . fluticasone (FLONASE) 50 MCG/ACT nasal spray   Nasal   Place 2 sprays into the nose 2 (two) times daily as needed for allergies.   48 g   1   . Fluticasone-Salmeterol (ADVAIR DISKUS) 250-50 MCG/DOSE AEPB   Inhalation   Inhale 1 puff into the lungs 2 (two) times daily.   180 each   1   . furosemide (LASIX) 40 MG tablet   Oral   Take 40 mg by mouth 2 (two) times daily. Take 1 tablet in the morning and 1 in the afternoon         . glucose blood (ONETOUCH VERIO) test strip      Check blood sugar up to 4 times a day before meals and bedtime Dx code- 250.72 insulin-requiring   150 each   12   . glucose blood test strip      Check blood sugar up to 4 times a day before meals and bedtime Dx code- 250.72, insulin requiring   150 each   12   . insulin lispro protamine-lispro (HUMALOG 75/25) (75-25) 100 UNIT/ML SUSP injection   Subcutaneous   Inject 75-140 Units into the skin 2 (two) times daily with a meal. Inject 140 units in the morning and 75 units in the evening         . ipratropium (ATROVENT) 0.02 % nebulizer solution   Nebulization   Take 2.5 mLs (0.5 mg total) by nebulization every 6 (six) hours as needed for  wheezing.   75 mL   1   . metFORMIN (GLUCOPHAGE) 1000 MG tablet   Oral   Take 1 tablet (1,000 mg total) by mouth 2 (two) times daily.   180 tablet   1   . ONETOUCH DELICA LANCETS FINE MISC   Does not apply   1 each by Does not apply route 2 (two) times daily. Check blood sugar up to 4 times a day before meals and bedtime Dx code- 250.72, insulin requiring From 03/03/2013   100 each   12   . oxyCODONE-acetaminophen (ROXICET) 5-325 MG per tablet   Oral   Take 2 tablets by mouth every 6 (six) hours as needed for pain.   240 tablet   0     Rx 2 of 2. Please fill 30 days after printed date.   . pravastatin (PRAVACHOL) 40 MG tablet   Oral   Take 1 tablet (40 mg total) by mouth every morning.   90 tablet   1   . pregabalin (LYRICA) 200 MG capsule   Oral   Take 200 mg by mouth 3 (three) times daily.         . silver sulfADIAZINE (SILVADENE) 1 % cream   Topical   Apply 1 application topically daily.          Marland Kitchen  travoprost, benzalkonium, (TRAVATAN) 0.004 % ophthalmic solution      1 drop at bedtime.         . traZODone (DESYREL) 100 MG tablet   Oral   Take 1 tablet (100 mg total) by mouth at bedtime.   90 tablet   1    BP 174/92  Pulse 72  Temp(Src) 98.2 F (36.8 C) (Oral)  Resp 18  SpO2 96% Physical Exam  Nursing note and vitals reviewed. Constitutional: She is oriented to person, place, and time. She appears well-developed and well-nourished.  HENT:  Head: Normocephalic and atraumatic.  Eyes: Conjunctivae are normal. Right eye exhibits no discharge. Left eye exhibits no discharge.  Neck: Normal range of motion. Neck supple. No tracheal deviation present.  Cardiovascular: Normal rate and regular rhythm.   Pulmonary/Chest: Effort normal. She has wheezes (mild exp bilaterral, difficult exam due to body habitus).  Abdominal: Soft. She exhibits no distension. There is no tenderness. There is no guarding.  Morbid obesity  Musculoskeletal: She exhibits edema  (2+ LE bilateral, mild pain bilateral LE).  Neurological: She is alert and oriented to person, place, and time. No cranial nerve deficit.  Skin: Skin is warm. No rash noted.  Psychiatric: She has a normal mood and affect.    ED Course  Procedures (including critical care time) Labs Review Labs Reviewed  CBC - Abnormal; Notable for the following:    WBC 10.9 (*)    All other components within normal limits  BASIC METABOLIC PANEL - Abnormal; Notable for the following:    Potassium 3.3 (*)    CO2 33 (*)    Glucose, Bld 57 (*)    All other components within normal limits  PRO B NATRIURETIC PEPTIDE - Abnormal; Notable for the following:    Pro B Natriuretic peptide (BNP) 256.9 (*)    All other components within normal limits  D-DIMER, QUANTITATIVE - Abnormal; Notable for the following:    D-Dimer, Quant 1.38 (*)    All other components within normal limits  POCT I-STAT TROPONIN I   Imaging Review Dg Chest 2 View  09/16/2013   CLINICAL DATA:  Chest pain. Hypertension.  EXAM: CHEST  2 VIEW  COMPARISON:  07/06/2013  FINDINGS: The heart size and mediastinal contours are within normal limits. Both lungs are clear. The visualized skeletal structures are unremarkable.  IMPRESSION: No active cardiopulmonary disease.   Electronically Signed   By: Amie Portland   On: 09/16/2013 18:14    MDM  No diagnosis found. Broad differential with risk factors.  Since different than previous, recent surgery and leg swelling worse on left CT recommended, pt had kidney failure in the past with dye, VQ rec.  Spoke with radiology tech. Pt comfortable in ED.  Date: 09/16/2013  Rate: 74  Rhythm: normal sinus rhythm  QRS Axis: normal  Intervals: normal  ST/T Wave abnormalities: nonspecific ST changes  Conduction Disutrbances:none  Narrative Interpretation:  No acute findings  Pt has persistent anterior chest pain, nitro/ fentanyl given.  Only mild improvement in ED.  Pt not comfortable going home as sent  in by pcp for chest pain. NM scan reviewed, low prob PE.  Dg Chest 2 View  09/16/2013   CLINICAL DATA:  Chest pain. Hypertension.  EXAM: CHEST  2 VIEW  COMPARISON:  07/06/2013  FINDINGS: The heart size and mediastinal contours are within normal limits. Both lungs are clear. The visualized skeletal structures are unremarkable.  IMPRESSION: No active cardiopulmonary disease.   Electronically  Signed   By: Amie Portland   On: 09/16/2013 18:14   Nm Pulmonary Perf And Vent  09/16/2013   *RADIOLOGY REPORT*  Clinical Data:  52 year old female with chest pain and shortness of breath.  History of DVT in past.  NUCLEAR MEDICINE VENTILATION - PERFUSION LUNG SCAN  Technique:  Ventilation images were obtained in multiple projections using inhaled aerosol technetium 99 M DTPA.  Perfusion images were obtained in multiple projections after intravenous injection of Tc-55m MAA.  Radiopharmaceuticals:  Tc-24m DTPA aerosol and 6.0 mCi Tc-1m MAA.  Comparison: 09/16/2013 and prior chest radiographs  Findings:  Ventilation:   No focal ventilation defect.  Perfusion:   No wedge shaped peripheral perfusion defects to suggest acute pulmonary embolism  IMPRESSION: Very low probability of pulmonary embolism (less than 10%).   Original Report Authenticated By: Harmon Pier, M.D.    Paged hospitalist for tele observation. Tele obs placed, pt improved on recheck, minimal CP.   Enid Skeens, MD 09/17/13 0130

## 2013-09-16 NOTE — ED Notes (Signed)
Pt concerned about IV contrast due to last time "my kidneys shut down 60%." Md Jodi Mourning made aware. At bedside discussing plan of care with pt.

## 2013-09-16 NOTE — Assessment & Plan Note (Addendum)
Patient has a history of leg pain and negative workup for DVT by lower extremity Doppler in the past, however given her recent surgical history and severe worsening tenderness over the left calf area, it is important to rule out DVT. After discussed to Dr. Dalphine Handing, we decided to transfer her to ED for further work up. She may need LE venous doppler.

## 2013-09-16 NOTE — ED Notes (Signed)
The pt was sent here from [facility] for treatment.  Chest pain for over a week with radiation into her anterior neck. Lt leg pain and swelling for 4 days also

## 2013-09-16 NOTE — ED Notes (Signed)
Patient requested EKG be delayed because she had diarrhea and needed to use the bathroom.

## 2013-09-17 ENCOUNTER — Encounter (HOSPITAL_COMMUNITY): Payer: Self-pay | Admitting: *Deleted

## 2013-09-17 DIAGNOSIS — R079 Chest pain, unspecified: Secondary | ICD-10-CM

## 2013-09-17 DIAGNOSIS — G894 Chronic pain syndrome: Secondary | ICD-10-CM

## 2013-09-17 DIAGNOSIS — I5032 Chronic diastolic (congestive) heart failure: Secondary | ICD-10-CM

## 2013-09-17 DIAGNOSIS — R0609 Other forms of dyspnea: Secondary | ICD-10-CM

## 2013-09-17 DIAGNOSIS — K219 Gastro-esophageal reflux disease without esophagitis: Secondary | ICD-10-CM

## 2013-09-17 DIAGNOSIS — E1159 Type 2 diabetes mellitus with other circulatory complications: Secondary | ICD-10-CM

## 2013-09-17 DIAGNOSIS — I517 Cardiomegaly: Secondary | ICD-10-CM

## 2013-09-17 DIAGNOSIS — I10 Essential (primary) hypertension: Secondary | ICD-10-CM

## 2013-09-17 DIAGNOSIS — J449 Chronic obstructive pulmonary disease, unspecified: Secondary | ICD-10-CM

## 2013-09-17 LAB — RAPID URINE DRUG SCREEN, HOSP PERFORMED
Amphetamines: NOT DETECTED
Barbiturates: NOT DETECTED
Benzodiazepines: NOT DETECTED
Cocaine: NOT DETECTED
Opiates: NOT DETECTED
Tetrahydrocannabinol: NOT DETECTED

## 2013-09-17 LAB — GLUCOSE, CAPILLARY
Glucose-Capillary: 345 mg/dL — ABNORMAL HIGH (ref 70–99)
Glucose-Capillary: 395 mg/dL — ABNORMAL HIGH (ref 70–99)
Glucose-Capillary: 372 mg/dL — ABNORMAL HIGH (ref 70–99)
Glucose-Capillary: 243 mg/dL — ABNORMAL HIGH (ref 70–99)

## 2013-09-17 LAB — TROPONIN I
Troponin I: <0.3 ng/mL (ref <0.30)
Troponin I: <0.3 ng/mL (ref <0.30)

## 2013-09-17 MED ORDER — ENOXAPARIN SODIUM 80 MG/0.8ML ~~LOC~~ SOLN
65.0000 mg | SUBCUTANEOUS | Status: DC
  Administered 2013-09-17 – 2013-09-21 (×5): 65 mg via SUBCUTANEOUS
  Filled 2013-09-17 (×5): qty 0.8

## 2013-09-17 MED ORDER — INSULIN ASPART 100 UNIT/ML ~~LOC~~ SOLN
0.0000 [IU] | Freq: Three times a day (TID) | SUBCUTANEOUS | Status: DC
  Administered 2013-09-17: 11 [IU] via SUBCUTANEOUS
  Administered 2013-09-17: 15 [IU] via SUBCUTANEOUS
  Administered 2013-09-18: 3 [IU] via SUBCUTANEOUS
  Administered 2013-09-18: 8 [IU] via SUBCUTANEOUS
  Administered 2013-09-19: 5 [IU] via SUBCUTANEOUS
  Administered 2013-09-19: 11 [IU] via SUBCUTANEOUS
  Administered 2013-09-20: 5 [IU] via SUBCUTANEOUS
  Administered 2013-09-21: 3 [IU] via SUBCUTANEOUS
  Administered 2013-09-21 (×2): 5 [IU] via SUBCUTANEOUS

## 2013-09-17 MED ORDER — PREGABALIN 100 MG PO CAPS
200.0000 mg | ORAL_CAPSULE | Freq: Three times a day (TID) | ORAL | Status: DC
  Administered 2013-09-17 – 2013-09-21 (×14): 200 mg via ORAL
  Filled 2013-09-17 (×11): qty 2
  Filled 2013-09-17: qty 8
  Filled 2013-09-17 (×2): qty 2

## 2013-09-17 MED ORDER — ATORVASTATIN CALCIUM 40 MG PO TABS
40.0000 mg | ORAL_TABLET | Freq: Every day | ORAL | Status: DC
  Administered 2013-09-17 – 2013-09-21 (×5): 40 mg via ORAL
  Filled 2013-09-17 (×5): qty 1

## 2013-09-17 MED ORDER — ALBUTEROL SULFATE HFA 108 (90 BASE) MCG/ACT IN AERS: 2.0000 | INHALATION_SPRAY | Freq: Four times a day (QID) | RESPIRATORY_TRACT | Status: DC | PRN

## 2013-09-17 MED ORDER — SODIUM CHLORIDE 0.9 % IJ SOLN: 3.0000 mL | INTRAMUSCULAR | Status: DC | PRN

## 2013-09-17 MED ORDER — ONDANSETRON HCL 4 MG PO TABS
4.0000 mg | ORAL_TABLET | Freq: Four times a day (QID) | ORAL | Status: DC | PRN
  Administered 2013-09-17 – 2013-09-21 (×2): 4 mg via ORAL
  Filled 2013-09-17 (×2): qty 1

## 2013-09-17 MED ORDER — INSULIN ASPART PROT & ASPART (70-30 MIX) 100 UNIT/ML ~~LOC~~ SUSP
25.0000 [IU] | Freq: Two times a day (BID) | SUBCUTANEOUS | Status: DC
  Administered 2013-09-17: 25 [IU] via SUBCUTANEOUS
  Filled 2013-09-17 (×2): qty 10

## 2013-09-17 MED ORDER — SODIUM CHLORIDE 0.9 % IJ SOLN
3.0000 mL | Freq: Two times a day (BID) | INTRAMUSCULAR | Status: DC
  Administered 2013-09-17 – 2013-09-19 (×4): 3 mL via INTRAVENOUS

## 2013-09-17 MED ORDER — GI COCKTAIL ~~LOC~~
30.0000 mL | Freq: Once | ORAL | Status: AC
  Administered 2013-09-17: 30 mL via ORAL
  Filled 2013-09-17: qty 30

## 2013-09-17 MED ORDER — FUROSEMIDE 40 MG PO TABS
40.0000 mg | ORAL_TABLET | Freq: Two times a day (BID) | ORAL | Status: DC
  Administered 2013-09-17 (×2): 40 mg via ORAL
  Filled 2013-09-17 (×3): qty 1

## 2013-09-17 MED ORDER — PANTOPRAZOLE SODIUM 20 MG PO TBEC
20.0000 mg | DELAYED_RELEASE_TABLET | Freq: Every day | ORAL | Status: DC
  Administered 2013-09-17: 20 mg via ORAL
  Filled 2013-09-17 (×2): qty 1

## 2013-09-17 MED ORDER — METFORMIN HCL 500 MG PO TABS: 1000.0000 mg | ORAL_TABLET | Freq: Two times a day (BID) | ORAL | Status: DC

## 2013-09-17 MED ORDER — ASPIRIN EC 81 MG PO TBEC: 81.0000 mg | DELAYED_RELEASE_TABLET | Freq: Every morning | ORAL | Status: DC

## 2013-09-17 MED ORDER — HYDRALAZINE HCL 20 MG/ML IJ SOLN
20.0000 mg | Freq: Once | INTRAMUSCULAR | Status: AC
  Administered 2013-09-17: 20 mg via INTRAVENOUS
  Filled 2013-09-17: qty 1

## 2013-09-17 MED ORDER — METFORMIN HCL 500 MG PO TABS
1000.0000 mg | ORAL_TABLET | Freq: Two times a day (BID) | ORAL | Status: DC
  Administered 2013-09-17 – 2013-09-21 (×9): 1000 mg via ORAL
  Filled 2013-09-17 (×10): qty 2

## 2013-09-17 MED ORDER — ASPIRIN EC 81 MG PO TBEC
81.0000 mg | DELAYED_RELEASE_TABLET | Freq: Every day | ORAL | Status: DC
  Administered 2013-09-17 – 2013-09-21 (×5): 81 mg via ORAL
  Filled 2013-09-17 (×5): qty 1

## 2013-09-17 MED ORDER — SILVER SULFADIAZINE 1 % EX CREA
TOPICAL_CREAM | Freq: Every day | CUTANEOUS | Status: DC
  Administered 2013-09-17 – 2013-09-21 (×5): via TOPICAL
  Filled 2013-09-17: qty 85

## 2013-09-17 MED ORDER — ONDANSETRON HCL 4 MG/2ML IJ SOLN
4.0000 mg | Freq: Four times a day (QID) | INTRAMUSCULAR | Status: DC | PRN
  Administered 2013-09-20: 4 mg via INTRAVENOUS
  Filled 2013-09-17: qty 2

## 2013-09-17 MED ORDER — SODIUM CHLORIDE 0.9 % IJ SOLN
3.0000 mL | Freq: Two times a day (BID) | INTRAMUSCULAR | Status: DC
  Administered 2013-09-17 – 2013-09-21 (×8): 3 mL via INTRAVENOUS

## 2013-09-17 MED ORDER — ATENOLOL 100 MG PO TABS
100.0000 mg | ORAL_TABLET | Freq: Every morning | ORAL | Status: DC
  Administered 2013-09-17 – 2013-09-21 (×5): 100 mg via ORAL
  Filled 2013-09-17 (×5): qty 1

## 2013-09-17 MED ORDER — SODIUM CHLORIDE 0.9 % IV SOLN: 250.0000 mL | INTRAVENOUS | Status: DC | PRN

## 2013-09-17 MED ORDER — BUPROPION HCL ER (SR) 150 MG PO TB12
150.0000 mg | ORAL_TABLET | Freq: Two times a day (BID) | ORAL | Status: DC
  Administered 2013-09-17 – 2013-09-21 (×10): 150 mg via ORAL
  Filled 2013-09-17 (×11): qty 1

## 2013-09-17 MED ORDER — ONDANSETRON HCL 4 MG/2ML IJ SOLN: 4.0000 mg | Freq: Three times a day (TID) | INTRAMUSCULAR | Status: DC | PRN

## 2013-09-17 MED ORDER — BENAZEPRIL HCL 40 MG PO TABS
40.0000 mg | ORAL_TABLET | Freq: Every day | ORAL | Status: DC
  Administered 2013-09-17 – 2013-09-21 (×5): 40 mg via ORAL
  Filled 2013-09-17 (×5): qty 1

## 2013-09-17 MED ORDER — POTASSIUM CHLORIDE CRYS ER 20 MEQ PO TBCR
20.0000 meq | EXTENDED_RELEASE_TABLET | Freq: Once | ORAL | Status: AC
  Administered 2013-09-17: 20 meq via ORAL
  Filled 2013-09-17: qty 1

## 2013-09-17 MED ORDER — MAGNESIUM SULFATE 40 MG/ML IJ SOLN
2.0000 g | Freq: Once | INTRAMUSCULAR | Status: AC
  Administered 2013-09-17: 2 g via INTRAVENOUS
  Filled 2013-09-17: qty 50

## 2013-09-17 MED ORDER — OXYCODONE-ACETAMINOPHEN 5-325 MG PO TABS
1.0000 | ORAL_TABLET | Freq: Four times a day (QID) | ORAL | Status: DC | PRN
  Administered 2013-09-17 – 2013-09-19 (×6): 2 via ORAL
  Administered 2013-09-20 (×2): 1 via ORAL
  Administered 2013-09-21: 2 via ORAL
  Filled 2013-09-17 (×2): qty 1
  Filled 2013-09-17 (×7): qty 2

## 2013-09-17 MED ORDER — ALUM & MAG HYDROXIDE-SIMETH 200-200-20 MG/5ML PO SUSP: 30.0000 mL | Freq: Four times a day (QID) | ORAL | Status: DC | PRN

## 2013-09-17 MED ORDER — DIPHENHYDRAMINE HCL 25 MG PO CAPS
25.0000 mg | ORAL_CAPSULE | Freq: Three times a day (TID) | ORAL | Status: DC | PRN
  Administered 2013-09-17: 25 mg via ORAL
  Filled 2013-09-17: qty 1

## 2013-09-17 MED ORDER — FUROSEMIDE 10 MG/ML IJ SOLN
40.0000 mg | Freq: Two times a day (BID) | INTRAMUSCULAR | Status: DC
  Administered 2013-09-17 – 2013-09-20 (×7): 40 mg via INTRAVENOUS
  Filled 2013-09-17 (×6): qty 4

## 2013-09-17 MED ORDER — HYDRALAZINE HCL 20 MG/ML IJ SOLN
5.0000 mg | Freq: Once | INTRAMUSCULAR | Status: AC
  Administered 2013-09-17: 5 mg via INTRAVENOUS
  Filled 2013-09-17: qty 1

## 2013-09-17 MED ORDER — TRAZODONE HCL 100 MG PO TABS
100.0000 mg | ORAL_TABLET | Freq: Every day | ORAL | Status: DC
  Administered 2013-09-17 – 2013-09-20 (×5): 100 mg via ORAL
  Filled 2013-09-17 (×6): qty 1

## 2013-09-17 NOTE — Progress Notes (Signed)
  Echocardiogram 2D Echocardiogram has been performed.  Georgian Co 09/17/2013, 12:50 PM

## 2013-09-17 NOTE — Progress Notes (Signed)
Pt non-compliant with Diabetic diet.  Pt continues to consume regular sodas and juices from outside the hospital.  Pt was educated on the diabetic diet, and why this was causing her blood sugar to rise.  She expressed understanding, but said "I am fine at home drinking these things because I was on more insulin." I once again advised her against her dietary choices, and she said she understood but continues to consume drinks high in sugar.  MD was notified regarding non-compliance.

## 2013-09-17 NOTE — Progress Notes (Signed)
Subjective: Lying in bed about to get ECHO done. Says chest pain has reduced in severity, but still present, say she feels it in her upper sternal area and it goes upward toward her neck and left side of her jaw. Says she has gained some weight in the past week, as she checks her weight daily. No change in SOB. Says she has been complaint with her frusemide therapy.  Objective: Vital signs in last 24 hours: Filed Vitals:   09/17/13 0135 09/17/13 0140 09/17/13 0227 09/17/13 0657  BP: 130/60 149/98 159/78 160/85  Pulse: 83 81 81 86  Temp:   97.9 F (36.6 C) 98.1 F (36.7 C)  TempSrc:   Oral Oral  Resp: 19 19 20 20   Height:   5\' 7"  (1.702 m)   Weight:   286 lb 3.2 oz (129.819 kg)   SpO2: 96% 96% 99% 98%   Weight change:   Intake/Output Summary (Last 24 hours) at 09/17/13 1000 Last data filed at 09/17/13 0523  Gross per 24 hour  Intake    850 ml  Output    600 ml  Net    250 ml   Physical Exam. GENERAL- alert, co-operative, appears as stated age, not in any distress. HEENT- Atraumatic, normocephalic, PERRL, EOMI, oral mucosa appears moist. CARDIAC- RRR, no murmurs, rubs or gallops. RESP- Moving equal volumes of air ABDOMEN- Soft,non tender. NEURO- Cr N 2-12 intact, strenght equal and present in all extremities. EXTREMITIES- pulse 2+, symmetric. SKIN- Warm, dry, No rash or lesion. PSYCH- Normal mood and affect, appropriate thought content and speech.  Lab Results: Basic Metabolic Panel:  Recent Labs Lab 09/16/13 1354 09/16/13 1706  NA 146* 144  K 3.7 3.3*  CL 104 101  CO2 31 33*  GLUCOSE 131* 57*  BUN 10 11  CREATININE 0.77 0.79  CALCIUM 9.0 9.2  MG 1.3*  --    CBC:  Recent Labs Lab 09/16/13 1706  WBC 10.9*  HGB 12.5  HCT 38.3  MCV 93.4  PLT 279   Cardiac Enzymes:  Recent Labs Lab 09/17/13 0250  TROPONINI <0.30   BNP:  Recent Labs Lab 09/16/13 1706  PROBNP 256.9*   D-Dimer:  Recent Labs Lab 09/16/13 1706  DDIMER 1.38*    CBG:  Recent Labs Lab 09/16/13 1354 09/17/13 0623  GLUCAP 128* 345*   Hemoglobin A1C:  Recent Labs Lab 09/16/13 1406  HGBA1C 8.0   Urine Drug Screen: Drugs of Abuse     Component Value Date/Time   LABOPIA NONE DETECTED 09/17/2013 0521   LABOPIA PPS 08/02/2013 1015   COCAINSCRNUR NONE DETECTED 09/17/2013 0521   COCAINSCRNUR NEG 08/02/2013 1015   LABBENZ NONE DETECTED 09/17/2013 0521   LABBENZ NEG 08/02/2013 1015   LABBENZ NEG 06/28/2010 2130   AMPHETMU NONE DETECTED 09/17/2013 0521   AMPHETMU NEG 06/28/2010 2130   THCU NONE DETECTED 09/17/2013 0521   LABBARB NONE DETECTED 09/17/2013 0521   LABBARB NEG 08/02/2013 1015    Micro Results: No results found for this or any previous visit (from the past 240 hour(s)). Studies/Results: Dg Chest 2 View  09/16/2013   CLINICAL DATA:  Chest pain. Hypertension.  EXAM: CHEST  2 VIEW  COMPARISON:  07/06/2013  FINDINGS: The heart size and mediastinal contours are within normal limits. Both lungs are clear. The visualized skeletal structures are unremarkable.  IMPRESSION: No active cardiopulmonary disease.   Electronically Signed   By: Amie Portland   On: 09/16/2013 18:14   Nm Pulmonary Perf And  Vent  09/16/2013   *RADIOLOGY REPORT*  Clinical Data:  52 year old female with chest pain and shortness of breath.  History of DVT in past.  NUCLEAR MEDICINE VENTILATION - PERFUSION LUNG SCAN  Technique:  Ventilation images were obtained in multiple projections using inhaled aerosol technetium 99 M DTPA.  Perfusion images were obtained in multiple projections after intravenous injection of Tc-25m MAA.  Radiopharmaceuticals:  Tc-21m DTPA aerosol and 6.0 mCi Tc-56m MAA.  Comparison: 09/16/2013 and prior chest radiographs  Findings:  Ventilation:   No focal ventilation defect.  Perfusion:   No wedge shaped peripheral perfusion defects to suggest acute pulmonary embolism  IMPRESSION: Very low probability of pulmonary embolism (less than 10%).   Original Report  Authenticated By: Harmon Pier, M.D.   Medications: I have reviewed the patient's current medications. Scheduled Meds: . aspirin EC  81 mg Oral Daily  . atenolol  100 mg Oral q morning - 10a  . benazepril  40 mg Oral Daily  . buPROPion  150 mg Oral BID  . furosemide  40 mg Oral BID  . insulin aspart  0-15 Units Subcutaneous TID WC  . pregabalin  200 mg Oral TID  . sodium chloride  3 mL Intravenous Q12H  . sodium chloride  3 mL Intravenous Q12H  . traZODone  100 mg Oral QHS   Continuous Infusions:  PRN Meds:.sodium chloride, albuterol, alum & mag hydroxide-simeth, ondansetron (ZOFRAN) IV, ondansetron, oxyCODONE-acetaminophen, sodium chloride Assessment/Plan: Principal Problem:   Chest pain Active Problems:   Type II diabetes mellitus with peripheral circulatory disorders, uncontrolled (250.72)   Hyperlipidemia   HYPERTENSION   COPD   Severe obesity (BMI >= 40)  # Chest Pain- Patient has signif risk factors for CAD- Obesity,HTN, hyperlipidemia, DM, DCHF. 2 i-stat troponins done in the ED- Negative, and 1 Trop X2- negative, all done within 9 hrs of presentation. Also no EKG changes signif for ACS. Pt is also on aspirin. 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 UDS-  Aortic dissection unlikely- Chest xray showed no abn, clinical index of suspicion not strong enough to indicate further imaging. Symptoms likely GERD related. - GI cocktail- Helped relieve some of the Chest pain. - Aspirin- 81 mg. - Cardiology consult.  # DCHF- Last Echo- done 04/06/2013 60 -65%. Patient home meds- Frusemide- 40mg  BID. Patient says she has recently gained weight, about 10pounds. BNP- elevated but pt has had more elevated levels in the past - 09/16/2013- 256.9, 07/07/2013- 60.7, 01/26/2013- 727.3. - Strict input - output. - Echo- Pt has recently added about 10- pounds, says she is compliant with her frusemide-40mg  BID.  #  COPD- Home meds- Albuterol and ipratropium inhalers, And Advair. - Ipratropium and Albuterol Nebs- given once, continue on inhalers.   # DM- HBA1c- 09/16/2013- 8. Home meds- Humalog- 140- Morning, 75 units at night, with Metformin- 1000 BID. - Start pt on Metformin 1000mg  BID and  Novolog-70/30- 25units. -SSI- M  # HTN- Home meds- Benazepril- 40mg  daily, atenolol 100mg  daily, continue on admission. BP- today- 160/85. Blood pressure has been running a little high over the past 24 hrs- 130/60- 193/74.  # Hyperlipidemia- LDL- 142- last lipid profile- 06/28/2013. Home meds- Pravastatin- 40mg  daily.  # GERD- Pantoprazole 20mg  daily.  # DVT ppx- Lovenox.  # CODE- FULL.   Dispo: Disposition is deferred at this time, awaiting improvement of current medical problems.  Anticipated discharge in  approximately 1-2 day(s).   The patient does have a current PCP Burns Spain, MD) and does need an [facility] hospital follow-up appointment after discharge.  .Services Needed at time of discharge: Y = Yes, Blank = No PT:   OT:   RN:   Equipment:   Other:     LOS: 1 day   Kennis Carina, MD 09/17/2013, 10:00 AM

## 2013-09-17 NOTE — ED Notes (Signed)
Patient complaining of chest pain, 8/10 at this time.  Patient medicated per admit orders.

## 2013-09-17 NOTE — Progress Notes (Signed)
UR Completed.  Ruxin Ransome Jane [phone] 09/17/2013  

## 2013-09-17 NOTE — Progress Notes (Signed)
CRITICAL VALUE ALERT  Critical value received:  bg [id number]  Date of notification: 09/17/2013  Time of notification:  0620  Critical value read back:yes  Nurse who received alert:  Young Berry, RN  MD notified (1st page):  M. Lynch  Time of first page:  [phone]  MD notified (2nd page):  Time of second page:  Responding MD:    Time MD responded:

## 2013-09-17 NOTE — Consult Note (Signed)
CARDIOLOGY CONSULT NOTE  [name] MRN: [id number] DOB/AGE: [birth year] 52 y.o.  Admit date: 09/16/2013 Primary Physician Blanch Media, MD Primary Cardiologist None Chief Complaint  Chest pain  HPI:  The patient presents for evaluation of chest pain.  She has a history of SBE.  She did have a work up for chest pain in the past with a negative stress perfusion study in 2010.  Her EF was well-preserved.     She reports that she's been getting chest discomfort perhaps 20-30 times per day. It happened with activities such as bending over. Discomfort up into her jaw. She is nauseated and has thrown up. However, she also has this pain with activities such as minimal walking she house. He does get short of breath. However, she's not describing any PND or orthopnea. She's not had any palpitations, presyncope or syncope. He has had increasing edema. She has not had any prior coronary disease though she reports she was told she had artifact. I reviewed multiple recent visits and could not find mention of a heart attack. He did have endocarditis. During this admission her D dimer was elevated but VQ was low probability for PE.  Labs have been negative.  ProBNP was 256.  Echo showed a normal EF without valve disease.    Past Medical History  Diagnosis Date  . Depression   . GERD (gastroesophageal reflux disease)   . Hyperlipidemia   . Hypertension   . Glaucoma   . PVD (peripheral vascular disease) with claudication     Stents to bilateral common iliac arteries (left 2005, right 2008), on chronic plavix  . Diabetic peripheral neuropathy   . Hyperplastic colon polyp 12/2010    Per colonoscopy (12/2010) - Dr. Arlyce Dice  . Diverticulosis   . Asthma   . COPD 01/08/2007    PFT's 05/2007 : FEV1/FVC 82, FEV1 64% pred, FEF 25-75% 40% predicted, 16% improvement in FEV1 with bronchodilators.     . Type II diabetes mellitus with peripheral circulatory disorders, uncontrolled (250.72) DX:  1993    Insulin dep. Poor control. Complicated by diabetic foot ulcer and diabetic eye disease.    . Tobacco abuse   . Chronic osteomyelitis of foot     chronic, right secondary to diabetic foot ulcers  . Polymicrobial bacterial infection 01/2013    GBS and S. aureus bacteremia // Source likely infected diabetic foot ulcer  . 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 Blood cultures 01/26/13 S. Aureus and GBS. Blood cultures Feb 6th, 8th, and 9th and March were negative.Repeat TEE 3/20 negative for vegitations  . 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 Chronic diastolic heart failure     grade 2 per 2D echocardiogram (01/2013)  . Ulcer of foot, chronic     Left. No OM per MRI (01/2013)  . DVT of upper extremity (deep vein thrombosis) 03/11/2013    Secondary to PICC line. Right brachial vein, diagnosed on 03/10/2013 Coumadin for 3 months. End date 06/10/2013   . Chronic pain syndrome 12/03/2011    Likely secondary to depression, "fibromyalgia", neuropathy, and obesity. Lumbar MRI 2014 no sig change from prior (2008) : Stable hypertrophic facet disease most notable at L4-5. Stable shallow left foraminal/extraforaminal disc protrusion at L4-5. No direct neural compression.      . CHF (congestive heart failure)   . Environmental allergies     Hx: of    Past Surgical  History  Procedure Laterality Date  . Abdominal hysterectomy  1997    secondary to uterine fibroids  . Tubal ligation    . Rotator cuff repair      bilateral  . Breast biopsy      multiple-benign per pt  . Wrist surgery      right  . Ganglion cyst excision      multiple  . Other surgical history      stents in lower ext  . Tee without cardioversion N/A 01/31/2013    Procedure: TRANSESOPHAGEAL ECHOCARDIOGRAM (TEE);  Surgeon: Pricilla Riffle, MD;  Location: [facility] ENDOSCOPY;  Service: Cardiovascular;  Laterality: N/A;  Rm [phone]  . Bladder surgery      bladder  reconstruction surgery  . Tee without cardioversion N/A 03/10/2013    Procedure: TRANSESOPHAGEAL ECHOCARDIOGRAM (TEE);  Surgeon: Laurey Morale, MD;  Location: [facility] ENDOSCOPY;  Service: Cardiovascular;  Laterality: N/A;  Rm. [phone]  . Skin grafts Bilateral 05/13/2013    Dr Lajoyce Corners  . Skin split graft Bilateral 05/13/2013    Procedure: Right and Left Foot Allograft Skin Graft;  Surgeon: Nadara Mustard, MD;  Location: [facility] OR;  Service: Orthopedics;  Laterality: Bilateral;  Right and Left Foot Allograft Skin Graft  . Toe amputation Left 08/31/2013    4TH & 5 TH TOE   . Amputation Left 08/31/2013    Procedure: AMPUTATION RAY;  Surgeon: Nadara Mustard, MD;  Location: [facility] OR;  Service: Orthopedics;  Laterality: Left;  Left Foot 5th Ray Amputation    Allergies  Allergen Reactions  . Iohexol      Desc: IV CONTRAST CAUSE NEPHROPATHY IN 2007   . Ivp Dye [Iodinated Diagnostic Agents]   . Morphine Sulfate Itching and Rash   Prescriptions prior to admission  Medication Sig Dispense Refill  . albuterol (PROAIR HFA) 108 (90 BASE) MCG/ACT inhaler Inhale 2 puffs into the lungs every 6 (six) hours as needed for wheezing.  3 Inhaler  1  . albuterol (PROVENTIL) (5 MG/ML) 0.5% nebulizer solution Take 0.5 mLs (2.5 mg total) by nebulization every 6 (six) hours as needed for wheezing or shortness of breath.  20 mL  5  . aspirin EC 81 MG tablet Take 1 tablet (81 mg total) by mouth every morning.  90 tablet  1  . atenolol (TENORMIN) 100 MG tablet Take 1 tablet (100 mg total) by mouth every morning.  90 tablet  1  . baclofen (LIORESAL) 10 MG tablet Take 1 tablet (10 mg total) by mouth 3 (three) times daily.  20 each  1  . benazepril (LOTENSIN) 40 MG tablet Take 40 mg by mouth daily.      Marland Kitchen buPROPion (WELLBUTRIN SR) 150 MG 12 hr tablet Take 1 tablet (150 mg total) by mouth 2 (two) times daily.  180 tablet  1  . diphenhydrAMINE (BENADRYL) 25 MG tablet Take 25 mg by mouth every 6 (six) hours.       Marland Kitchen esomeprazole (NEXIUM) 20 MG  capsule Take 1 capsule (20 mg total) by mouth daily before breakfast.  90 capsule  1  . fluticasone (FLONASE) 50 MCG/ACT nasal spray Place 2 sprays into the nose 2 (two) times daily as needed for allergies.  48 g  1  . Fluticasone-Salmeterol (ADVAIR DISKUS) 250-50 MCG/DOSE AEPB Inhale 1 puff into the lungs 2 (two) times daily.  180 each  1  . furosemide (LASIX) 40 MG tablet Take 40 mg by mouth 2 (two) times daily. Take 1 tablet  in the morning and 1 in the afternoon      . glucose blood (ONETOUCH VERIO) test strip Check blood sugar up to 4 times a day before meals and bedtime Dx code- 250.72 insulin-requiring  150 each  12  . glucose blood test strip Check blood sugar up to 4 times a day before meals and bedtime Dx code- 250.72, insulin requiring  150 each  12  . insulin lispro protamine-lispro (HUMALOG 75/25) (75-25) 100 UNIT/ML SUSP injection Inject 75-140 Units into the skin 2 (two) times daily with a meal. Inject 140 units in the morning and 75 units in the evening      . ipratropium (ATROVENT) 0.02 % nebulizer solution Take 2.5 mLs (0.5 mg total) by nebulization every 6 (six) hours as needed for wheezing.  75 mL  1  . metFORMIN (GLUCOPHAGE) 1000 MG tablet Take 1 tablet (1,000 mg total) by mouth 2 (two) times daily.  180 tablet  1  . ONETOUCH DELICA LANCETS FINE MISC 1 each by Does not apply route 2 (two) times daily. Check blood sugar up to 4 times a day before meals and bedtime Dx code- 250.72, insulin requiring From 03/03/2013  100 each  12  . oxyCODONE-acetaminophen (ROXICET) 5-325 MG per tablet Take 2 tablets by mouth every 6 (six) hours as needed for pain.  240 tablet  0  . pravastatin (PRAVACHOL) 40 MG tablet Take 1 tablet (40 mg total) by mouth every morning.  90 tablet  1  . pregabalin (LYRICA) 200 MG capsule Take 200 mg by mouth 3 (three) times daily.      . silver sulfADIAZINE (SILVADENE) 1 % cream Apply 1 application topically daily.       . travoprost, benzalkonium, (TRAVATAN) 0.004 %  ophthalmic solution 1 drop at bedtime.      . traZODone (DESYREL) 100 MG tablet Take 1 tablet (100 mg total) by mouth at bedtime.  90 tablet  1   Family History  Problem Relation Age of Onset  . Diverticulosis Mother   . Diabetes Mother   . Hypertension Mother   . Congestive Heart Failure Mother   . Asthma Father     History   Social History  . Marital Status: Single    Spouse Name: N/A    Number of Children: 2  . Years of Education: college   Occupational History  . Disability     previously worked as a Psychologist, counselling History Main Topics  . Smoking status: Current Every Day Smoker -- 0.50 packs/day for 40 years    Types: Cigarettes  . Smokeless tobacco: Never Used     Comment: Half pack or less. Smokes anpack per week.      '  I already have information on quitting "  . Alcohol Use: No  . Drug Use: No     Comment: none in many years; no thc x2 years, no crack/cocaine 1989  . Sexual Activity: Not on file     Comment: states she is currently trying to quit smoking   Other Topics Concern  . Not on file   Social History Narrative   On disability. Lives with son in [place]. 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    Boyfriend passed away stage 4 cancer Mar 26, 2013.     As stated in the HPI and negative for all other systems.  Physical Exam: Blood pressure 177/75, pulse 72, temperature 97.6 F (36.4 C), temperature source Oral, resp. rate 19, height 5\' 7"  (1.702  m), weight 286 lb 3.2 oz (129.819 kg), SpO2 98.00%.  GENERAL:  Well appearing HEENT:  Pupils equal round and reactive, fundi not visualized, oral mucosa unremarkable NECK:  No jugular venous distention, waveform within normal limits, carotid upstroke brisk and symmetric, no bruits, no thyromegaly LYMPHATICS:  No cervical, inguinal adenopathy LUNGS:  Clear to auscultation bilaterally BACK:  No CVA tenderness CHEST:  Unremarkable HEART:  PMI not displaced or sustained,S1 and S2 within normal limits, no S3, no S4, no clicks,  no rubs, no murmurs ABD:  Flat, positive bowel sounds normal in frequency in pitch, no bruits, no rebound, no guarding, no midline pulsatile mass, no hepatomegaly, no splenomegaly EXT:  2 plus pulses throughout, severe diffuse edema, no cyanosis no clubbing SKIN:  No rashes no nodules NEURO:  Cranial nerves II through XII grossly intact, motor grossly intact throughout PSYCH:  Cognitively intact, oriented to person place and time   Labs: Lab Results  Component Value Date   BUN 11 09/16/2013   Lab Results  Component Value Date   CREATININE 0.79 09/16/2013   Lab Results  Component Value Date   NA 144 09/16/2013   K 3.3* 09/16/2013   CL 101 09/16/2013   CO2 33* 09/16/2013   Lab Results  Component Value Date   TROPONINI <0.30 09/17/2013   Lab Results  Component Value Date   WBC 10.9* 09/16/2013   HGB 12.5 09/16/2013   HCT 38.3 09/16/2013   MCV 93.4 09/16/2013   PLT 279 09/16/2013   Radiology:  NAD  EKG: NSR, rate 74,  No acute ST T wave changes.  09/16/13  ASSESSMENT AND PLAN:   CHEST PAIN:  The pain is atypical. He does however have known disease and significant risk factors. I will schedule her for [facility].    HTN:  The blood pressure is elevated.  However, she is on multiple medications. He needs weight loss as primary therapy.   EDEMA:  He has significant edema.   I will switch her IV Lasix for now.   SignedRollene Rotunda 09/17/2013, 3:23 PM

## 2013-09-17 NOTE — Progress Notes (Signed)
From ED, transferred via wheelchair, alert and oriented x 4. Patient with peripheral IV and no O2 support.  Patient denies chest pains and shortness of breath.  Education on heart failure provided.  Oriented to unit's routines and procedures.  Instructed to call for concerns and assistance.  Reminded pt on non weight bearing status of left foot.  Patient refused use of bedside commode.  Bed alarm turned on.  Will monitor and evaluate at intervals.

## 2013-09-17 NOTE — Progress Notes (Signed)
MD paged to notify of 177/79 BP and pt c/o acid reflux discomfort.  Awaiting orders.

## 2013-09-17 NOTE — H&P (Signed)
PCP:   Blanch Media, MD   Chief Complaint:  Cp one week  HPI: 52 yo female with multiple medical problems including dm, obesity, htn, gerd, copd, chf, chronic pain comes in with one week of sscp that goes up into her throat which occasionally results in vomiting nonbloody material and sob.  She just had an amputation of her toe.  No fevers.  No diarrhea.  Does not feel like her regular heartburn and she is on nexium.  No le edema or swelling but has not been as mobile due to recent surgery.  No cp now.  Not relieved or worse with eating.  vq shows low probability.  Review of Systems:  Positive and negative as per HPI otherwise all other systems are negative  Past Medical History: Past Medical History  Diagnosis Date  . Depression   . GERD (gastroesophageal reflux disease)   . Hyperlipidemia   . Hypertension   . Glaucoma   . PVD (peripheral vascular disease) with claudication     Stents to bilateral common iliac arteries (left 2005, right 2008), on chronic plavix  . Diabetic peripheral neuropathy   . Hyperplastic colon polyp 12/2010    Per colonoscopy (12/2010) - Dr. Arlyce Dice  . Diverticulosis   . Asthma   . COPD 01/08/2007    PFT's 05/2007 : FEV1/FVC 82, FEV1 64% pred, FEF 25-75% 40% predicted, 16% improvement in FEV1 with bronchodilators.     . Type II diabetes mellitus with peripheral circulatory disorders, uncontrolled (250.72) DX: 1993    Insulin dep. Poor control. Complicated by diabetic foot ulcer and diabetic eye disease.    . Tobacco abuse   . Chronic osteomyelitis of foot     chronic, right secondary to diabetic foot ulcers  . Polymicrobial bacterial infection 01/2013    GBS and S. aureus bacteremia // Source likely infected diabetic foot ulcer  . 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 Blood cultures 01/26/13 S. Aureus and GBS. Blood cultures Feb 6th, 8th, and 9th and March were negative.Repeat TEE 3/20 negative  for vegitations  . 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 Chronic diastolic heart failure     grade 2 per 2D echocardiogram (01/2013)  . Ulcer of foot, chronic     Left. No OM per MRI (01/2013)  . DVT of upper extremity (deep vein thrombosis) 03/11/2013    Secondary to PICC line. Right brachial vein, diagnosed on 03/10/2013 Coumadin for 3 months. End date 06/10/2013   . Chronic pain syndrome 12/03/2011    Likely secondary to depression, "fibromyalgia", neuropathy, and obesity. Lumbar MRI 2014 no sig change from prior (2008) : Stable hypertrophic facet disease most notable at L4-5. Stable shallow left foraminal/extraforaminal disc protrusion at L4-5. No direct neural compression.      . CHF (congestive heart failure)   . Environmental allergies     Hx: of   Past Surgical History  Procedure Laterality Date  . Abdominal hysterectomy  1997    secondary to uterine fibroids  . Tubal ligation    . Rotator cuff repair      bilateral  . Breast biopsy      multiple-benign per pt  . Wrist surgery      right  . Ganglion cyst excision      multiple  . Other surgical history      stents in lower ext  . Tee without cardioversion N/A 01/31/2013  Procedure: TRANSESOPHAGEAL ECHOCARDIOGRAM (TEE);  Surgeon: Pricilla Riffle, MD;  Location: [facility] ENDOSCOPY;  Service: Cardiovascular;  Laterality: N/A;  Rm [phone]  . Bladder surgery      bladder reconstruction surgery  . Tee without cardioversion N/A 03/10/2013    Procedure: TRANSESOPHAGEAL ECHOCARDIOGRAM (TEE);  Surgeon: Laurey Morale, MD;  Location: [facility] ENDOSCOPY;  Service: Cardiovascular;  Laterality: N/A;  Rm. [phone]  . Skin grafts Bilateral 05/13/2013    Dr Lajoyce Corners  . Skin split graft Bilateral 05/13/2013    Procedure: Right and Left Foot Allograft Skin Graft;  Surgeon: Nadara Mustard, MD;  Location: [facility] OR;  Service: Orthopedics;  Laterality: Bilateral;  Right and Left Foot Allograft Skin Graft  . Toe amputation Left 08/31/2013    4TH & 5 TH TOE    . Amputation Left 08/31/2013    Procedure: AMPUTATION RAY;  Surgeon: Nadara Mustard, MD;  Location: [facility] OR;  Service: Orthopedics;  Laterality: Left;  Left Foot 5th Ray Amputation    Medications: Prior to Admission medications   Medication Sig Start Date End Date Taking? Authorizing Provider  albuterol (PROAIR HFA) 108 (90 BASE) MCG/ACT inhaler Inhale 2 puffs into the lungs every 6 (six) hours as needed for wheezing. 06/15/13  Yes Burns Spain, MD  albuterol (PROVENTIL) (5 MG/ML) 0.5% nebulizer solution Take 0.5 mLs (2.5 mg total) by nebulization every 6 (six) hours as needed for wheezing or shortness of breath. 06/15/13  Yes Burns Spain, MD  aspirin EC 81 MG tablet Take 1 tablet (81 mg total) by mouth every morning. 06/15/13  Yes Burns Spain, MD  atenolol (TENORMIN) 100 MG tablet Take 1 tablet (100 mg total) by mouth every morning. 06/15/13  Yes Burns Spain, MD  baclofen (LIORESAL) 10 MG tablet Take 1 tablet (10 mg total) by mouth 3 (three) times daily. 08/25/13  Yes Burns Spain, MD  benazepril (LOTENSIN) 40 MG tablet Take 40 mg by mouth daily.   Yes Historical Provider, MD  buPROPion (WELLBUTRIN SR) 150 MG 12 hr tablet Take 1 tablet (150 mg total) by mouth 2 (two) times daily. 06/15/13 06/15/14 Yes Burns Spain, MD  diphenhydrAMINE (BENADRYL) 25 MG tablet Take 25 mg by mouth every 6 (six) hours.  05/03/13  Yes Genelle Gather, MD  esomeprazole (NEXIUM) 20 MG capsule Take 1 capsule (20 mg total) by mouth daily before breakfast. 06/15/13  Yes Burns Spain, MD  fluticasone [facility]) 50 MCG/ACT nasal spray Place 2 sprays into the nose 2 (two) times daily as needed for allergies. 06/15/13  Yes Burns Spain, MD  Fluticasone-Salmeterol (ADVAIR DISKUS) 250-50 MCG/DOSE AEPB Inhale 1 puff into the lungs 2 (two) times daily. 06/15/13  Yes Burns Spain, MD  furosemide (LASIX) 40 MG tablet Take 40 mg by mouth 2 (two) times daily. Take 1 tablet in the  morning and 1 in the afternoon 06/15/13  Yes Burns Spain, MD  glucose blood (ONETOUCH VERIO) test strip Check blood sugar up to 4 times a day before meals and bedtime Dx code- 250.72 insulin-requiring 05/30/13  Yes Bronson Curb, MD  glucose blood test strip Check blood sugar up to 4 times a day before meals and bedtime Dx code- 250.72, insulin requiring 03/03/13  Yes Burns Spain, MD  insulin lispro protamine-lispro (HUMALOG 75/25) (75-25) 100 UNIT/ML SUSP injection Inject 75-140 Units into the skin 2 (two) times daily with a meal. Inject 140 units in the morning and 75 units in the evening  Yes Historical Provider, MD  ipratropium (ATROVENT) 0.02 % nebulizer solution Take 2.5 mLs (0.5 mg total) by nebulization every 6 (six) hours as needed for wheezing. 06/15/13  Yes Burns Spain, MD  metFORMIN (GLUCOPHAGE) 1000 MG tablet Take 1 tablet (1,000 mg total) by mouth 2 (two) times daily. 06/15/13  Yes Burns Spain, MD  [facility] DELICA LANCETS FINE MISC 1 each by Does not apply route 2 (two) times daily. Check blood sugar up to 4 times a day before meals and bedtime Dx code- 250.72, insulin requiring From 03/03/2013 05/30/13  Yes Bronson Curb, MD  oxyCODONE-acetaminophen (ROXICET) 5-325 MG per tablet Take 2 tablets by mouth every 6 (six) hours as needed for pain. 08/02/13 08/02/14 Yes Burns Spain, MD  pravastatin (PRAVACHOL) 40 MG tablet Take 1 tablet (40 mg total) by mouth every morning. 06/15/13  Yes Burns Spain, MD  pregabalin (LYRICA) 200 MG capsule Take 200 mg by mouth 3 (three) times daily.   Yes Historical Provider, MD  silver sulfADIAZINE (SILVADENE) 1 % cream Apply 1 application topically daily.  05/20/13  Yes Historical Provider, MD  travoprost, benzalkonium, (TRAVATAN) 0.004 % ophthalmic solution 1 drop at bedtime.   Yes Historical Provider, MD  traZODone (DESYREL) 100 MG tablet Take 1 tablet (100 mg total) by mouth at bedtime. 06/15/13  Yes Burns Spain,  MD    Allergies:   Allergies  Allergen Reactions  . Iohexol      Desc: IV CONTRAST CAUSE NEPHROPATHY IN 2007   . Ivp Dye [Iodinated Diagnostic Agents]   . Morphine Sulfate Itching and Rash    Social History:  reports that she has been smoking Cigarettes.  She has a 20 pack-year smoking history. She has never used smokeless tobacco. She reports that she uses illicit drugs (Marijuana and "Crack" cocaine). She reports that she does not drink alcohol.  Family History: Family History  Problem Relation Age of Onset  . Diverticulosis Mother   . Diabetes Mother   . Hypertension Mother   . Congestive Heart Failure Mother   . Asthma Father     Physical Exam: Filed Vitals:   09/16/13 2100 09/16/13 2115 09/16/13 2200 09/17/13 0029  BP: 178/59  158/61   Pulse: 72  71 75  Temp:      TempSrc:      Resp: 18  13 15   SpO2: 97% 100% 99% 97%   General appearance: alert, cooperative and no distress Head: Normocephalic, without obvious abnormality, atraumatic Eyes: negative Nose: Nares normal. Septum midline. Mucosa normal. No drainage or sinus tenderness. Neck: no JVD and supple, symmetrical, trachea midline Lungs: clear to auscultation bilaterally Heart: regular rate and rhythm, S1, S2 normal, no murmur, click, rub or gallop Abdomen: soft, non-tender; bowel sounds normal; no masses,  no organomegaly Extremities: extremities normal, atraumatic, no cyanosis or edema Pulses: 2+ and symmetric Skin: Skin color, texture, turgor normal. No rashes or lesions Neuro:  Grossly normal.   Labs on Admission:   Recent Labs  09/16/13 1354 09/16/13 1706  NA 146* 144  K 3.7 3.3*  CL 104 101  CO2 31 33*  GLUCOSE 131* 57*  BUN 10 11  CREATININE 0.77 0.79  CALCIUM 9.0 9.2  MG 1.3*  --     Recent Labs  09/16/13 1706  WBC 10.9*  HGB 12.5  HCT 38.3  MCV 93.4  PLT 279   Radiological Exams on Admission: Dg Chest 2 View  09/16/2013   CLINICAL DATA:  Chest pain. Hypertension.  EXAM:  CHEST  2 VIEW  COMPARISON:  07/06/2013  FINDINGS: The heart size and mediastinal contours are within normal limits. Both lungs are clear. The visualized skeletal structures are unremarkable.  IMPRESSION: No active cardiopulmonary disease.   Electronically Signed   By: Amie Portland   On: 09/16/2013 18:14   Nm Pulmonary Perf And Vent  09/16/2013   *RADIOLOGY REPORT*  Clinical Data:  52 year old female with chest pain and shortness of breath.  History of DVT in past.  NUCLEAR MEDICINE VENTILATION - PERFUSION LUNG SCAN  Technique:  Ventilation images were obtained in multiple projections using inhaled aerosol technetium 99 M DTPA.  Perfusion images were obtained in multiple projections after intravenous injection of Tc-67m MAA.  Radiopharmaceuticals:  Tc-5m DTPA aerosol and 6.0 mCi Tc-40m MAA.  Comparison: 09/16/2013 and prior chest radiographs  Findings:  Ventilation:   No focal ventilation defect.  Perfusion:   No wedge shaped peripheral perfusion defects to suggest acute pulmonary embolism  IMPRESSION: Very low probability of pulmonary embolism (less than 10%).   Original Report Authenticated By: Harmon Pier, M.D.    Assessment/Plan  52 yo female with atypical cp, sob Principal Problem:   Chest pain Active Problems:   Type II diabetes mellitus with peripheral circulatory disorders, uncontrolled (250.72)   Hyperlipidemia   HYPERTENSION   COPD   Severe obesity (BMI >= 40)  obs tele.  Romi.  Ck echo in am.  Ck uds (crack use noted).  Gi cocktail.  Full code.  Aster Screws A 09/17/2013, 12:31 AM

## 2013-09-18 ENCOUNTER — Other Ambulatory Visit: Payer: Self-pay

## 2013-09-18 ENCOUNTER — Observation Stay (HOSPITAL_COMMUNITY): Payer: Medicare Other

## 2013-09-18 LAB — BASIC METABOLIC PANEL
BUN: 20 mg/dL (ref 6–23)
CO2: 28 mEq/L (ref 19–32)
Calcium: 8.9 mg/dL (ref 8.4–10.5)
Chloride: 99 mEq/L (ref 96–112)
Creatinine, Ser: 0.82 mg/dL (ref 0.50–1.10)
GFR calc Af Amer: >90 mL/min (ref >90)
GFR calc non Af Amer: 80 mL/min — ABNORMAL LOW (ref >90)
Glucose, Bld: 237 mg/dL — ABNORMAL HIGH (ref 70–99)
Potassium: 3.9 mEq/L (ref 3.5–5.1)
Sodium: 138 mEq/L (ref 135–145)

## 2013-09-18 LAB — GLUCOSE, CAPILLARY
Glucose-Capillary: 213 mg/dL — ABNORMAL HIGH (ref 70–99)
Glucose-Capillary: 178 mg/dL — ABNORMAL HIGH (ref 70–99)
Glucose-Capillary: 290 mg/dL — ABNORMAL HIGH (ref 70–99)
Glucose-Capillary: 202 mg/dL — ABNORMAL HIGH (ref 70–99)

## 2013-09-18 LAB — MAGNESIUM: Magnesium: 1.7 mg/dL (ref 1.5–2.5)

## 2013-09-18 MED ORDER — INSULIN ASPART PROT & ASPART (70-30 MIX) 100 UNIT/ML ~~LOC~~ SUSP
15.0000 [IU] | Freq: Two times a day (BID) | SUBCUTANEOUS | Status: DC
  Administered 2013-09-18 – 2013-09-21 (×6): 15 [IU] via SUBCUTANEOUS
  Filled 2013-09-18: qty 10

## 2013-09-18 MED ORDER — INSULIN ASPART PROT & ASPART (70-30 MIX) 100 UNIT/ML ~~LOC~~ SUSP
12.0000 [IU] | Freq: Every day | SUBCUTANEOUS | Status: AC
  Administered 2013-09-18: 12 [IU] via SUBCUTANEOUS
  Filled 2013-09-18: qty 10

## 2013-09-18 MED ORDER — NITROGLYCERIN 0.2 MG/HR TD PT24
0.2000 mg | MEDICATED_PATCH | Freq: Every day | TRANSDERMAL | Status: DC
  Filled 2013-09-18: qty 1

## 2013-09-18 MED ORDER — GI COCKTAIL ~~LOC~~
30.0000 mL | Freq: Three times a day (TID) | ORAL | Status: DC | PRN
  Administered 2013-09-18 – 2013-09-19 (×2): 30 mL via ORAL
  Filled 2013-09-18 (×3): qty 30

## 2013-09-18 MED ORDER — PANTOPRAZOLE SODIUM 40 MG PO TBEC
40.0000 mg | DELAYED_RELEASE_TABLET | Freq: Two times a day (BID) | ORAL | Status: DC
  Administered 2013-09-18 – 2013-09-20 (×5): 40 mg via ORAL
  Filled 2013-09-18 (×5): qty 1

## 2013-09-18 MED ORDER — INSULIN ASPART PROT & ASPART (70-30 MIX) 100 UNIT/ML ~~LOC~~ SUSP
35.0000 [IU] | Freq: Two times a day (BID) | SUBCUTANEOUS | Status: DC
  Filled 2013-09-18: qty 10

## 2013-09-18 NOTE — Progress Notes (Signed)
SUBJECTIVE:  Chest pain earlier this AM.  Feels better with diuresis.   PHYSICAL EXAM Filed Vitals:   09/17/13 1412 09/17/13 1700 09/17/13 2100 09/18/13 0446  BP: 177/75 152/73 148/72 145/76  Pulse: 72 75 79 105  Temp: 97.6 F (36.4 C)  97.6 F (36.4 C) 98.2 F (36.8 C)  TempSrc: Oral  Oral Oral  Resp: 19  18 18   Height:      Weight:    288 lb 8 oz (130.863 kg)  SpO2: 98%  98% 96%   General:  No distress Lungs:  Clear Heart:  RRR Abdomen:  Positive bowel sounds, no rebound no guarding Extremities:  Moderate tense edema.  LABS: Lab Results  Component Value Date   TROPONINI <0.30 09/17/2013   Results for orders placed during the hospital encounter of 09/16/13 (from the past 24 hour(s))  TROPONIN I     Status: None   Collection Time    09/17/13 10:50 AM      Result Value Range   Troponin I <0.30  <0.30 ng/mL  GLUCOSE, CAPILLARY     Status: Abnormal   Collection Time    09/17/13 11:42 AM      Result Value Range   Glucose-Capillary 395 (*) 70 - 99 mg/dL  GLUCOSE, CAPILLARY     Status: Abnormal   Collection Time    09/17/13  4:10 PM      Result Value Range   Glucose-Capillary 372 (*) 70 - 99 mg/dL  GLUCOSE, CAPILLARY     Status: Abnormal   Collection Time    09/17/13  9:07 PM      Result Value Range   Glucose-Capillary 243 (*) 70 - 99 mg/dL   Comment 1 Notify RN    BASIC METABOLIC PANEL     Status: Abnormal   Collection Time    09/18/13  5:35 AM      Result Value Range   Sodium 138  135 - 145 mEq/L   Potassium 3.9  3.5 - 5.1 mEq/L   Chloride 99  96 - 112 mEq/L   CO2 28  19 - 32 mEq/L   Glucose, Bld 237 (*) 70 - 99 mg/dL   BUN 20  6 - 23 mg/dL   Creatinine, Ser 1.61  0.50 - 1.10 mg/dL   Calcium 8.9  8.4 - 09.6 mg/dL   GFR calc non Af Amer 80 (*) >90 mL/min   GFR calc Af Amer >90  >90 mL/min  MAGNESIUM     Status: None   Collection Time    09/18/13  5:35 AM      Result Value Range   Magnesium 1.7  1.5 - 2.5 mg/dL  GLUCOSE, CAPILLARY     Status:  Abnormal   Collection Time    09/18/13  6:30 AM      Result Value Range   Glucose-Capillary 213 (*) 70 - 99 mg/dL    Intake/Output Summary (Last 24 hours) at 09/18/13 0803 Last data filed at 09/18/13 0700  Gross per 24 hour  Intake   1520 ml  Output   3000 ml  Net  -1480 ml     ASSESSMENT AND PLAN:  CHEST PAIN:  Still with chest pain this AM.  Atypical as described.   Part one of her Lexiscan Myoview this morning.   HTN:  BP is high normal.  Continue current meds.  EDEMA:  Good urine output.  Labs OK.  I would continue IV diuresis.   DM: Give half  of AM insulin since she is NPO.      Fayrene Fearing [facility] 09/18/2013 8:03 AM

## 2013-09-18 NOTE — Progress Notes (Signed)
Utilization Review completed.  

## 2013-09-18 NOTE — H&P (Addendum)
Internal Medicine Teaching Service Attending Note Date: 09/18/2013  Patient name: Jennifer [name]  Medical record number: [id number]  Date of birth: 01-22-1961   I saw the patient today and reviewed the plan of care with my resident team.   History: This patient has been transferred from [facility] to our care yesterday. She was admitted for chest pain work up.   The patient reports having chest pain 20-30 times a day for the past 2 weeks. She says she had it at 7 am today. Apparently it is located in the suprasternal region and travels up her left jaw and neck. She says she can differentiate it from her acid reflux pain, and it is nothing like it. She rates it as 8/10. It last for about 7-10 minutes. She can feel it rising up her neck and radiating to her ear. She does not know of any worsening or alleviating factors. She says that the GI cocktail that was given to her, numbs her throat and helps the pain. The patient had a bout of nausea and vomiting (multiple times during the day - the patient lost count) and diarrhea (multiple episodes, no blood) last week. She has had no diarrhea or vomiting since yesterday.   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  Objective: Filed Vitals:   09/17/13 1412 09/17/13 1700 09/17/13 2100  09/18/13 0446  BP: 177/75 152/73 148/72 145/76  Pulse: 72 75 79 105  Temp: 97.6 F (36.4 C)  97.6 F (36.4 C) 98.2 F (36.8 C)  TempSrc: Oral  Oral Oral  Resp: 19  18 18   Height:      Weight:    288 lb 8 oz (130.863 kg)  SpO2: 98%  98% 96%    General: Resting in bed. HEENT: PERRL, EOMI, no scleral icterus. Heart: RRR, no rubs, murmurs or gallops. Lungs: Clear to auscultation bilaterally, no wheezes, rales, or rhonchi. Abdomen: Soft, nontender, nondistended, BS present. Extremities: Warm, no pedal edema. Neuro: Alert and oriented X3, grossly non-focal.  Relevant Labs   Recent Labs Lab 09/16/13 1706  HGB 12.5  HCT 38.3  WBC 10.9*  PLT 279     Recent Labs Lab 09/17/13 0250 09/17/13 1050  TROPONINI <0.30 <0.30    Recent Labs Lab 09/16/13 1354 09/16/13 1706 09/18/13 0535  NA 146* 144 138  K 3.7 3.3* 3.9  CL 104 101 99  CO2 31 33* 28  GLUCOSE 131* 57* 237*  BUN 10 11 20   CREATININE 0.77 0.79 0.82  CALCIUM 9.0 9.2 8.9  MG 1.3*  --  1.7    Recent Labs Lab 09/17/13 1142 09/17/13 1610 09/17/13 2107 09/18/13 0630 09/18/13 1057  GLUCAP 395* 372* 243* 213* 178*   Recent Imaging CXR from 9/26 shows no active disease. VQ scan negative. Dopplers negative.  EKG no change from prior. 2D Echo does not show any wall motion abnormalities.   Assessment and Plan Chest pain - At this time, etiology is unclear - I have a broad differential which includes coronary artery disease, GERD related  pain, diabetic gastroparesis, oesophagitis. Cardiac work up has been negative. Cardiology is on board and the patient is going to be administered a [employer]. Given the severity and frequency of the pain, if the cardiac work up is negative, we will consider GI consultation. For now, the patient has been ordered GI cocktail prn 3-4 times a day and  Increase protonix to 40 bid oral.  Other chronic issues per resident note.    Thanks Aletta Edouard, MD 09/18/2013, 12:27  PM

## 2013-09-18 NOTE — H&P (Signed)
Date: 09/18/2013               Patient Name:  Jennifer [name] MRN: [id number]  DOB: [birth year] Age / Sex: 52 y.o., female   PCP: Burns Spain, MD         Medical Service: Internal Medicine Teaching Service         Attending Physician: Dr. Aletta Edouard, MD    First Contact: Dr. Mariea Clonts Pager: [phone]  Second Contact: Dr. Sherrine Maples Pager: [phone]       After Hours (After 5p/  First Contact Pager: [phone]  weekends / holidays): Second Contact Pager: [phone]   Chief Complaint: Chest pain  History of Present Illness: Patient was admitted by triad hospitalist for chest pain rule out, and was subsequently transferred to IMTS.   34 y o F with PMH of COPD, Hyperlipidemia, HTN, Morbid obesity, DM 2 with peripheral circulatory disorders, Chronic diastolic CHF, GERD, infective endocarditis.  Patient presented with c/o of chest pain for one week, says its located in the upper part of her chest and radiates upward to her jaw and toward her Lt ear. Not sure if pain is related to exertion, last about 7-10 minutes, no assoc SOB or dizziness. Pt says this pain is different from the reflux symptoms she normally gets, for which she take s nexium- 20mg  daily. Pt has signif PMH- Hyperlipidemia, HTN, DM and chronic diastolic heart failure, has a 20 pack  Year smoking hx. Pt takes aspirin- 81mg  daily.  No chest pain presently, but said she had chest pain previously and was given NTD sublingual, which might have helped but she does not remember. Patient also reports having several episodes of nausea, vomiting and diarrhea in the over the past few weeks.   Meds: Current Facility-Administered Medications  Medication Dose Route Frequency Provider Last Rate Last Dose  . 0.9 %  sodium chloride infusion  250 mL Intravenous PRN Tarry Kos, MD      . albuterol (PROVENTIL HFA;VENTOLIN HFA) 108 (90 BASE) MCG/ACT inhaler 2 puff  2 puff Inhalation Q6H PRN Tarry Kos, MD      . aspirin EC tablet 81 mg  81 mg  Oral Daily Tarry Kos, MD   81 mg at 09/18/13 1052  . atenolol (TENORMIN) tablet 100 mg  100 mg Oral q morning - 10a Tarry Kos, MD   100 mg at 09/18/13 1117  . atorvastatin (LIPITOR) tablet 40 mg  40 mg Oral q1800 Kennis Carina, MD   40 mg at 09/17/13 2032  . benazepril (LOTENSIN) tablet 40 mg  40 mg Oral Daily Tarry Kos, MD   40 mg at 09/18/13 1117  . buPROPion (WELLBUTRIN SR) 12 hr tablet 150 mg  150 mg Oral BID Tarry Kos, MD   150 mg at 09/18/13 1054  . diphenhydrAMINE (BENADRYL) capsule 25 mg  25 mg Oral Q8H PRN Genelle Gather, MD   25 mg at 09/17/13 2041  . enoxaparin (LOVENOX) injection 65 mg  65 mg Subcutaneous Q24H Aletta Edouard, MD   65 mg at 09/17/13 1309  . furosemide (LASIX) injection 40 mg  40 mg Intravenous Q12H Rollene Rotunda, MD   40 mg at 09/18/13 0544  . gi cocktail (Maalox,Lidocaine,Donnatal)  30 mL Oral TID PRN Genelle Gather, MD   30 mL at 09/18/13 1052  . insulin aspart (novoLOG) injection 0-15 Units  0-15 Units Subcutaneous TID WC Zannie Cove, MD   3 Units at 09/18/13 1236  . insulin aspart protamine-  aspart (NOVOLOG MIX 70/30) injection 15 Units  15 Units Subcutaneous BID WC Genelle Gather, MD      . metFORMIN (GLUCOPHAGE) tablet 1,000 mg  1,000 mg Oral BID WC Genelle Gather, MD   1,000 mg at 09/18/13 0600  . ondansetron (ZOFRAN) tablet 4 mg  4 mg Oral Q6H PRN Tarry Kos, MD   4 mg at 09/17/13 1539   Or  . ondansetron (ZOFRAN) injection 4 mg  4 mg Intravenous Q6H PRN Tarry Kos, MD      . oxyCODONE-acetaminophen (PERCOCET/ROXICET) 5-325 MG per tablet 1-2 tablet  1-2 tablet Oral Q6H PRN Jinger Neighbors, NP   2 tablet at 09/18/13 1052  . pantoprazole (PROTONIX) EC tablet 40 mg  40 mg Oral BID Genelle Gather, MD   40 mg at 09/18/13 1052  . pregabalin (LYRICA) capsule 200 mg  200 mg Oral TID Jinger Neighbors, NP   200 mg at 09/18/13 1103  . silver sulfADIAZINE (SILVADENE) 1 % cream   Topical Daily Genelle Gather, MD      . sodium chloride 0.9 % injection  3 mL  3 mL Intravenous Q12H Tarry Kos, MD   3 mL at 09/17/13 2218  . sodium chloride 0.9 % injection 3 mL  3 mL Intravenous Q12H Tarry Kos, MD   3 mL at 09/18/13 1056  . sodium chloride 0.9 % injection 3 mL  3 mL Intravenous PRN Tarry Kos, MD      . traZODone (DESYREL) tablet 100 mg  100 mg Oral QHS Tarry Kos, MD   100 mg at 09/17/13 2205    Allergies: Allergies as of 09/16/2013 - Review Complete 09/16/2013  Allergen Reaction Noted  . Iohexol  08/17/2007  . Ivp dye [iodinated diagnostic agents]  05/10/2013  . Morphine sulfate Itching and Rash    Past Medical History  Diagnosis Date  . Depression   . GERD (gastroesophageal reflux disease)   . Hyperlipidemia   . Hypertension   . Glaucoma   . PVD (peripheral vascular disease) with claudication     Stents to bilateral common iliac arteries (left 2005, right 2008), on chronic plavix  . Diabetic peripheral neuropathy   . Hyperplastic colon polyp 12/2010    Per colonoscopy (12/2010) - Dr. Arlyce Dice  . Asthma   . COPD 01/08/2007    PFT's 05/2007 : FEV1/FVC 82, FEV1 64% pred, FEF 25-75% 40% predicted, 16% improvement in FEV1 with bronchodilators.     . Type II diabetes mellitus with peripheral circulatory disorders, uncontrolled (250.72) DX: 1993    Insulin dep. Poor control. Complicated by diabetic foot ulcer and diabetic eye disease.    . Tobacco abuse   . Chronic osteomyelitis of foot     chronic, right secondary to diabetic foot ulcers  . Polymicrobial bacterial infection 01/2013    GBS and S. aureus bacteremia // Source likely infected diabetic foot ulcer  . 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 Blood cultures 01/26/13 S. Aureus and GBS. Blood cultures Feb 6th, 8th, and 9th and March were negative.Repeat TEE 3/20 negative for vegitations  . 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 Chronic diastolic heart failure     grade 2 per 2D echocardiogram (01/2013)    . Ulcer of foot, chronic     Left. No OM per MRI (01/2013)  . DVT of upper extremity (deep vein thrombosis) 03/11/2013    Secondary to  PICC line. Right brachial vein, diagnosed on 03/10/2013 Coumadin for 3 months. End date 06/10/2013   . Chronic pain syndrome 12/03/2011    Likely secondary to depression, "fibromyalgia", neuropathy, and obesity. Lumbar MRI 2014 no sig change from prior (2008) : Stable hypertrophic facet disease most notable at L4-5. Stable shallow left foraminal/extraforaminal disc protrusion at L4-5. No direct neural compression.      . Environmental allergies     Hx: of   Past Surgical History  Procedure Laterality Date  . Abdominal hysterectomy  1997    secondary to uterine fibroids  . Tubal ligation    . Rotator cuff repair      bilateral  . Breast biopsy      multiple-benign per pt  . Wrist surgery      right  . Ganglion cyst excision      multiple  . Other surgical history      stents in lower ext  . Tee without cardioversion N/A 01/31/2013    Procedure: TRANSESOPHAGEAL ECHOCARDIOGRAM (TEE);  Surgeon: Pricilla Riffle, MD;  Location: [facility] ENDOSCOPY;  Service: Cardiovascular;  Laterality: N/A;  Rm [phone]  . Bladder surgery      bladder reconstruction surgery  . Tee without cardioversion N/A 03/10/2013    Procedure: TRANSESOPHAGEAL ECHOCARDIOGRAM (TEE);  Surgeon: Laurey Morale, MD;  Location: [facility] ENDOSCOPY;  Service: Cardiovascular;  Laterality: N/A;  Rm. [id number]  . Skin grafts Bilateral 05/13/2013    Dr Lajoyce Corners  . Skin split graft Bilateral 05/13/2013    Procedure: Right and Left Foot Allograft Skin Graft;  Surgeon: Nadara Mustard, MD;  Location: [facility] OR;  Service: Orthopedics;  Laterality: Bilateral;  Right and Left Foot Allograft Skin Graft  . Toe amputation Left 08/31/2013    4TH & 5 TH TOE   . Amputation Left 08/31/2013    Procedure: AMPUTATION RAY;  Surgeon: Nadara Mustard, MD;  Location: [facility] OR;  Service: Orthopedics;  Laterality: Left;  Left Foot 5th Ray Amputation    Family History  Problem Relation Age of Onset  . Diverticulosis Mother   . Diabetes Mother   . Hypertension Mother   . Congestive Heart Failure Mother   . Asthma Father   . CAD Sister 79    MI at age 63 per patient.  However, she has not had a stent or CABG.    History   Social History  . Marital Status: Single    Spouse Name: N/A    Number of Children: 2  . Years of Education: college   Occupational History  . Disability     previously worked as a Psychologist, counselling History Main Topics  . Smoking status: Current Every Day Smoker -- 0.50 packs/day for 40 years    Types: Cigarettes  . Smokeless tobacco: Never Used     Comment: Half pack or less. Smokes anpack per week.      '  I already have information on quitting "  . Alcohol Use: No  . Drug Use: No     Comment: none in many years; no thc x2 years, no crack/cocaine 1989  . Sexual Activity: Not on file     Comment: states she is currently trying to quit smoking   Other Topics Concern  . Not on file   Social History Narrative   On disability. Lives with son in [place]. 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    Boyfriend passed away stage 4 cancer March 08, 2013.    Review of  Systems: No pertinent findings on ROS.   Physical Exam: Blood pressure 110/50, pulse 63, temperature 97.9 F (36.6 C), temperature source Oral, resp. rate 18, height 5\' 7"  (1.702 m), weight 288 lb 8 oz (130.863 kg), SpO2 91.00%. GENERAL- alert, sitting up in bed, co-operative, appears as stated age, presently not in any distress.  HEENT- Atraumatic, normocephalic, PERRL, EOMI, oral mucosa appears moist.  CARDIAC- RRR, no murmurs, rubs or gallops.  RESP- Moving equal volumes of air.  ABDOMEN- Soft,non tender.  NEURO- Cr N 2-12 intact, strenght equal and present in all extremities.  EXTREMITIES- pulse 2+, symmetric.  SKIN- Warm, dry, No rash or lesion.  PSYCH- Normal mood and affect, appropriate thought content and speech.  Lab results: Basic Metabolic  Panel:  Recent Labs  09/16/13 1354 09/16/13 1706 09/18/13 0535  NA 146* 144 138  K 3.7 3.3* 3.9  CL 104 101 99  CO2 31 33* 28  GLUCOSE 131* 57* 237*  BUN 10 11 20   CREATININE 0.77 0.79 0.82  CALCIUM 9.0 9.2 8.9  MG 1.3*  --  1.7   CBC:  Recent Labs  09/16/13 1706  WBC 10.9*  HGB 12.5  HCT 38.3  MCV 93.4  PLT 279   Cardiac Enzymes:  Recent Labs  09/17/13 0250 09/17/13 1050  TROPONINI <0.30 <0.30   BNP:  Recent Labs  09/16/13 1706  PROBNP 256.9*   D-Dimer:  Recent Labs  09/16/13 1706  DDIMER 1.38*   CBG:  Recent Labs  09/17/13 0623 09/17/13 1142 09/17/13 1610 09/17/13 2107 09/18/13 0630 09/18/13 1057  GLUCAP 345* 395* 372* 243* 213* 178*   Hemoglobin A1C:  Recent Labs  09/16/13 1406  HGBA1C 8.0   Urine Drug Screen: Drugs of Abuse     Component Value Date/Time   LABOPIA NONE DETECTED 09/17/2013 0521   LABOPIA PPS 08/02/2013 1015   COCAINSCRNUR NONE DETECTED 09/17/2013 0521   COCAINSCRNUR NEG 08/02/2013 1015   LABBENZ NONE DETECTED 09/17/2013 0521   LABBENZ NEG 08/02/2013 1015   LABBENZ NEG 06/28/2010 2130   AMPHETMU NONE DETECTED 09/17/2013 0521   AMPHETMU NEG 06/28/2010 2130   THCU NONE DETECTED 09/17/2013 0521   LABBARB NONE DETECTED 09/17/2013 0521   LABBARB NEG 08/02/2013 1015    Imaging results:  Dg Chest 2 View  09/16/2013   CLINICAL DATA:  Chest pain. Hypertension.  EXAM: CHEST  2 VIEW  COMPARISON:  07/06/2013  FINDINGS: The heart size and mediastinal contours are within normal limits. Both lungs are clear. The visualized skeletal structures are unremarkable.  IMPRESSION: No active cardiopulmonary disease.   Electronically Signed   By: Amie Portland   On: 09/16/2013 18:14   Nm Pulmonary Perf And Vent  09/16/2013   *RADIOLOGY REPORT*  Clinical Data:  52 year old female with chest pain and shortness of breath.  History of DVT in past.  NUCLEAR MEDICINE VENTILATION - PERFUSION LUNG SCAN  Technique:  Ventilation images were obtained in  multiple projections using inhaled aerosol technetium 99 M DTPA.  Perfusion images were obtained in multiple projections after intravenous injection of Tc-57m MAA.  Radiopharmaceuticals:  Tc-27m DTPA aerosol and 6.0 mCi Tc-50m MAA.  Comparison: 09/16/2013 and prior chest radiographs  Findings:  Ventilation:   No focal ventilation defect.  Perfusion:   No wedge shaped peripheral perfusion defects to suggest acute pulmonary embolism  IMPRESSION: Very low probability of pulmonary embolism (less than 10%).   Original Report Authenticated By: Harmon Pier, M.D.   EKG- Rate- 74bpm, reg, sinus rhythm  no ST or T wave abn, normal axis, unchanged from previous tracing done in july this year.  Assessment & Plan by Problem: Principal Problem:   Chest pain Active Problems:   Type II diabetes mellitus with peripheral circulatory disorders, uncontrolled (250.72)   Hyperlipidemia   HYPERTENSION   COPD   Severe obesity (BMI >= 40) # Chest Pain- Patient has signif risk factors for CAD- Obesity,HTN, hyperlipidemia, DM, DCHF, and 20 pack year smoking hx. 2 i-stat troponins done in the ED- Negative, and Trop X2- negative. Also no EKG changes signif for ACS. Pt is also on aspirin. 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 UDS- Neg. Aortic dissection unlikely- Chest xray showed no abn, clinical index of suspicion not strong enough to indicate further imaging. Symptoms likely GERD related.  - GI cocktail- Helped relieve some of the Chest pain, contnue GI cocktail- Q4H/PRN.  - Aspirin- 81 mg.  - Cardiology consult- Recs appreciated.  - Had lexiscan myoview today, to complete test tomorrow morning.  - NPO from midnight today.  - IF Lexiscan is Negative- Will follow up with GI, consider Oesophagitis and DM gastroparesis, as patient has complaints of diarrhea, vomiting. # DCHF- Last Echo- done 04/06/2013 60 -65%. Patient home meds-  Frusemide- 40mg  BID. Patient says she has recently gained weight, about 10pounds. BNP- elevated but pt has had more elevated levels in the past - 09/16/2013- 256.9, 07/07/2013- 60.7, 01/26/2013- 727.3.  - Strict input - output.  - Echo- Ordered yesterday- EF- 65-70%, with severe concentric hypertrrophy. With normal wall motion, wth mild Lt atrial dilatation.  # COPD- Home meds- Albuterol and ipratropium inhalers, And Advair.  - Ipratropium and Albuterol Nebs- given once, continue on inhalers.  # DM- HBA1c- 09/16/2013- 8. Home meds- Humalog- 140- Morning, 75 units at night, with Metformin- 1000 BID.  - Start pt on Metformin 1000mg  BID and Novolog-70/30- 25units.  -SSI- M  # HTN- Home meds- Benazepril- 40mg  daily, atenolol 100mg  daily, continue on admission. BP- today- 160/85. Blood pressure has been running a little high over the past 24 hrs- 130/60- 193/74.  # Hyperlipidemia- LDL- 142- last lipid profile- 06/28/2013. Home meds- Pravastatin- 40mg  daily.  # GERD- Pantoprazole 20mg  daily- will increase to 40mg  BID.  .  # DVT ppx- Lovenox.  # CODE- FULL.    Dispo: Disposition is deferred at this time, awaiting improvement of current medical problems. Anticipated discharge in approximately 1-2 day(s).   The patient does have a current PCP Burns Spain, MD) and does need an [facility] hospital follow-up appointment after discharge.  The patient does not know have transportation limitations that hinder transportation to clinic appointments.  Signed: Kennis Carina, MD 09/18/2013, 2:21 PM

## 2013-09-18 NOTE — Progress Notes (Signed)
Patient came back from nuclear med complain of chest pain , 12 lead ekg done , Gi coctail  Given per MD order, also 2 tabs percocet given. DR.Ejiroghene came to the floor to see patient and notified about the cp also show her the ekg, nitro patch order and d/c before given to the pt.. Patient pain was relief with one dose of gi coctail and 2tabs of percocet. Will continue to  Monitor patient. Burr Medico RN

## 2013-09-19 ENCOUNTER — Encounter (HOSPITAL_COMMUNITY): Payer: Self-pay | Admitting: Physician Assistant

## 2013-09-19 ENCOUNTER — Encounter (HOSPITAL_COMMUNITY): Payer: Medicare Other

## 2013-09-19 DIAGNOSIS — I509 Heart failure, unspecified: Secondary | ICD-10-CM

## 2013-09-19 DIAGNOSIS — I5033 Acute on chronic diastolic (congestive) heart failure: Secondary | ICD-10-CM

## 2013-09-19 DIAGNOSIS — R079 Chest pain, unspecified: Secondary | ICD-10-CM | POA: Insufficient documentation

## 2013-09-19 DIAGNOSIS — I5030 Unspecified diastolic (congestive) heart failure: Secondary | ICD-10-CM

## 2013-09-19 DIAGNOSIS — E119 Type 2 diabetes mellitus without complications: Secondary | ICD-10-CM

## 2013-09-19 DIAGNOSIS — F17200 Nicotine dependence, unspecified, uncomplicated: Secondary | ICD-10-CM | POA: Insufficient documentation

## 2013-09-19 LAB — BASIC METABOLIC PANEL
BUN: 23 mg/dL (ref 6–23)
CO2: 25 mEq/L (ref 19–32)
Calcium: 8.9 mg/dL (ref 8.4–10.5)
Chloride: 99 mEq/L (ref 96–112)
Creatinine, Ser: 0.93 mg/dL (ref 0.50–1.10)
GFR calc Af Amer: 79 mL/min — ABNORMAL LOW (ref >90)
GFR calc non Af Amer: 68 mL/min — ABNORMAL LOW (ref >90)
Glucose, Bld: 262 mg/dL — ABNORMAL HIGH (ref 70–99)
Potassium: 4.2 mEq/L (ref 3.5–5.1)
Sodium: 138 mEq/L (ref 135–145)

## 2013-09-19 LAB — GLUCOSE, CAPILLARY
Glucose-Capillary: 250 mg/dL — ABNORMAL HIGH (ref 70–99)
Glucose-Capillary: 329 mg/dL — ABNORMAL HIGH (ref 70–99)

## 2013-09-19 MED ORDER — SODIUM CHLORIDE 0.9 % IV SOLN
INTRAVENOUS | Status: DC
  Administered 2013-09-20: 500 mL via INTRAVENOUS
  Administered 2013-09-20: 07:00:00 via INTRAVENOUS

## 2013-09-19 MED ORDER — REGADENOSON 0.4 MG/5ML IV SOLN
0.4000 mg | Freq: Once | INTRAVENOUS | Status: AC
  Administered 2013-09-19: 0.4 mg via INTRAVENOUS

## 2013-09-19 MED ORDER — REGADENOSON 0.4 MG/5ML IV SOLN
INTRAVENOUS | Status: AC
  Filled 2013-09-19: qty 5

## 2013-09-19 MED ORDER — TECHNETIUM TC 99M SESTAMIBI GENERIC - CARDIOLITE
30.0000 | Freq: Once | INTRAVENOUS | Status: AC | PRN
  Administered 2013-09-19: 30 via INTRAVENOUS

## 2013-09-19 NOTE — Consult Note (Signed)
I agree with the above documentation, including the assessment and plan. Nuclear stress test with normal EF and no inducible ischemia Agree with EGD to rule esophageal, gastric causes of CP.

## 2013-09-19 NOTE — Plan of Care (Signed)
Problem: Phase I Progression Outcomes Goal: EF % per last Echo/documented,Core Reminder form on chart Outcome: Completed/Met Date Met:  09/19/13 ECHO 09/17/13 EF 65-70 %

## 2013-09-19 NOTE — Progress Notes (Signed)
Subjective: Still complaints of chest pain, but appears chest pain is more in her upper chset radiating to her throat, describes some choking. Previously had some episodes of diarrhea and vomiting, has resolved on admission.  Objective: Vital signs in last 24 hours: Filed Vitals:   09/19/13 1149 09/19/13 1151 09/19/13 1153 09/19/13 1155  BP: 132/62 173/55 157/62 162/60  Pulse: 93 89 82 80  Temp:      TempSrc:      Resp:      Height:      Weight:      SpO2:       Weight change: -2 lb 12.5 oz (-1.263 kg)  Intake/Output Summary (Last 24 hours) at 09/19/13 1207 Last data filed at 09/19/13 0753  Gross per 24 hour  Intake    490 ml  Output   2500 ml  Net  -2010 ml   Physical Exam-  GENERAL- alert, co-operative, appears as stated age, not in any distress.  HEENT- Atraumatic, normocephalic, PERRL, EOMI, oral mucosa appears moist.  CARDIAC- RRR, no murmurs, rubs or gallops, pain aggrav on applying pressure to upper chest.  RESP- Moving equal volumes of air  ABDOMEN- Soft,non tender.  NEURO- Cr N 2-12 intact, strenght equal and present in all extremities.  EXTREMITIES- pulse 2+, symmetric.  SKIN- Warm, dry, No rash or lesion.  PSYCH- Normal mood and affect, appropriate thought content and speech.  Lab Results: Basic Metabolic Panel:  Recent Labs Lab 09/16/13 1354  09/18/13 0535 09/19/13 0510  NA 146*  < > 138 138  K 3.7  < > 3.9 4.2  CL 104  < > 99 99  CO2 31  < > 28 25  GLUCOSE 131*  < > 237* 262*  BUN 10  < > 20 23  CREATININE 0.77  < > 0.82 0.93  CALCIUM 9.0  < > 8.9 8.9  MG 1.3*  --  1.7  --   < > = values in this interval not displayed. CBC:  Recent Labs Lab 09/16/13 1706  WBC 10.9*  HGB 12.5  HCT 38.3  MCV 93.4  PLT 279   Cardiac Enzymes:  Recent Labs Lab 09/17/13 0250 09/17/13 1050  TROPONINI <0.30 <0.30   BNP:  Recent Labs Lab 09/16/13 1706  PROBNP 256.9*   D-Dimer:  Recent Labs Lab 09/16/13 1706  DDIMER 1.38*   CBG:  Recent  Labs Lab 09/17/13 2107 09/18/13 0630 09/18/13 1057 09/18/13 1651 09/18/13 2209 09/19/13 0606  GLUCAP 243* 213* 178* 290* 202* 250*   Hemoglobin A1C:  Recent Labs Lab 09/16/13 1406  HGBA1C 8.0   Urine Drug Screen: Drugs of Abuse     Component Value Date/Time   LABOPIA NONE DETECTED 09/17/2013 0521   LABOPIA PPS 08/02/2013 1015   COCAINSCRNUR NONE DETECTED 09/17/2013 0521   COCAINSCRNUR NEG 08/02/2013 1015   LABBENZ NONE DETECTED 09/17/2013 0521   LABBENZ NEG 08/02/2013 1015   LABBENZ NEG 06/28/2010 2130   AMPHETMU NONE DETECTED 09/17/2013 0521   AMPHETMU NEG 06/28/2010 2130   THCU NONE DETECTED 09/17/2013 0521   LABBARB NONE DETECTED 09/17/2013 0521   LABBARB NEG 08/02/2013 1015    Micro Results: No results found for this or any previous visit (from the past 240 hour(s)). Studies/Results: No results found. Medications: I have reviewed the patient's current medications. Scheduled Meds: . aspirin EC  81 mg Oral Daily  . atenolol  100 mg Oral q morning - 10a  . atorvastatin  40 mg Oral q1800  .  benazepril  40 mg Oral Daily  . buPROPion  150 mg Oral BID  . enoxaparin (LOVENOX) injection  65 mg Subcutaneous Q24H  . furosemide  40 mg Intravenous Q12H  . insulin aspart  0-15 Units Subcutaneous TID WC  . insulin aspart protamine- aspart  15 Units Subcutaneous BID WC  . metFORMIN  1,000 mg Oral BID WC  . pantoprazole  40 mg Oral BID  . pregabalin  200 mg Oral TID  . regadenoson      . silver sulfADIAZINE   Topical Daily  . sodium chloride  3 mL Intravenous Q12H  . sodium chloride  3 mL Intravenous Q12H  . traZODone  100 mg Oral QHS   Continuous Infusions:  PRN Meds:.sodium chloride, albuterol, diphenhydrAMINE, gi cocktail, ondansetron (ZOFRAN) IV, ondansetron, oxyCODONE-acetaminophen, sodium chloride Assessment/Plan: Principal Problem:   Chest pain Active Problems:   Type II diabetes mellitus with peripheral circulatory disorders, uncontrolled (250.72)   Hyperlipidemia    HYPERTENSION   COPD   Severe obesity (BMI >= 40)   Acute on chronic diastolic CHF (congestive heart failure), NYHA class 3  # Chest Pain- Patient has signif risk factors for CAD- Obesity,HTN, hyperlipidemia, DM, DCHF, and 20 pack year smoking hx. 2 i-stat troponins done in the ED- Negative, and Trop X2- negative. Also no EKG changes signif for ACS. Pt is also on aspirin. 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 UDS- Neg. Aortic dissection unlikely- Chest xray showed no abn, clinical index of suspicion not strong enough to indicate further imaging. Symptoms likely GERD related.  - GI cocktail- Helped relieve some of the Chest pain, continue GI cocktail- Q4H/PRN.  - Aspirin- 81 mg.  - Cardiology consult- Recs appreciated.  - Had lexiscan myoview yesterday, to complete test today, and depending on interpretation, discharge pt home.  - Also get GI consult as an in-pt, previous Endoscopy 01/15/2011 showed gastrooesphageal Jxn stricture, also considerations for esophagitis and DM gastroparesis, considering hx of vomiting and diarrhea. - NPO from midnight today.  - Increased protonix to 40mg  BID from dly yesterday.   # DCHF- Last Echo- done 04/06/2013 60 -65%. Patient home meds- Frusemide- 40mg  BID.  BNP- elevated but pt has had more elevated levels in the past - 09/16/2013- 256.9, 07/07/2013- 60.7, 01/26/2013- 727.3.  - Strict input - output.  - Echo- Ordered yesterday- EF- 65-70%, with severe concentric hypertrophy, With normal wall motion, wth mild Lt atrial dilatation.   # COPD- Home meds- Albuterol and ipratropium inhalers, And Advair.  - Ipratropium and Albuterol Nebs- given once, continue on inhalers.  # DM- HBA1c- 09/16/2013- 8. Home meds- Humalog- 140- Morning, 75 units at night, with Metformin- 1000 BID. Blood sugars- 178- 290. Patient is not compliant with her DM meds. - Cont Metformin 1000mg  BID and  Novolog-70/30- 25units.  -SSI- M  # HTN- Home meds- Benazepril- 40mg  daily, atenolol 100mg  daily, continue on admission. BP- today- 160/85. Blood pressure has been running a little high over the past 24 hrs- 130/60- 193/74.  # Hyperlipidemia- LDL- 142- last lipid profile- 06/28/2013. Cont Home meds- Pravastatin- 40mg  daily.  # GERD- Pantoprazole 20mg  daily- will increase to 40mg  BID.  # DVT ppx- Lovenox.  # CODE- FULL.   Dispo: Disposition is deferred at this time, awaiting improvement of current medical problems.  Anticipated discharge in approximately today.   The patient does have a current PCP Burns Spain,  MD) and does need an [facility] hospital follow-up appointment after discharge.  The patient does not know have transportation limitations that hinder transportation to clinic appointments.  .Services Needed at time of discharge: Y = Yes, Blank = No PT:   OT:   RN:   Equipment:   Other:     LOS: 3 days   Kennis Carina, MD 09/19/2013, 12:07 PM

## 2013-09-19 NOTE — Progress Notes (Signed)
Case discussed with Dr. Niu at the time of the visit.  We reviewed the resident's history and exam and pertinent patient test results.  I agree with the assessment, diagnosis, and plan of care documented in the resident's note.    

## 2013-09-19 NOTE — Progress Notes (Signed)
Inpatient Diabetes Program Recommendations  AACE/ADA: New Consensus Statement on Inpatient Glycemic Control (2013)  Target Ranges:  Prepandial:   less than 140 mg/dL      Peak postprandial:   less than 180 mg/dL (1-2 hours)      Critically ill patients:  140 - 180 mg/dL   Hyperglycemia, NPO status after midnight tonight with much reduced insulin doses for today.  Inpatient Diabetes Program Recommendations Insulin - Basal: Please add some lantus to regmen tonight.  Glucose has sustained in the 200's.  Please start with 20 units lantus while NPO and correction as ordered. Oral Agents: Please d/c metformin while in the hospital. Thank you, Lenor Coffin, RN, CNS, Diabetes Coordinator [phone])

## 2013-09-19 NOTE — Progress Notes (Signed)
SUBJECTIVE: Still having chest pain.   Tele: NSR  BP 146/65  Pulse 69  Temp(Src) 98.3 F (36.8 C) (Oral)  Resp 18  Ht 5\' 7"  (1.702 m)  Wt 285 lb 11.5 oz (129.6 kg)  BMI 44.74 kg/m2  SpO2 97%  Intake/Output Summary (Last 24 hours) at 09/19/13 0732 Last data filed at 09/19/13 0719  Gross per 24 hour  Intake    613 ml  Output   3475 ml  Net  -2862 ml    PHYSICAL EXAM General: Well developed, well nourished, in no acute distress. Alert and oriented x 3.  Psych:  Good affect, responds appropriately Neck: No JVD. No masses noted.  Lungs: Clear bilaterally with no wheezes or rhonci noted.  Heart: RRR with no murmurs noted. Abdomen: Bowel sounds are present. Soft, non-tender.  Extremities: 1+ lower extremity edema.   LABS: Basic Metabolic Panel:  Recent Labs  62/95/28 1354  09/18/13 0535 09/19/13 0510  NA 146*  < > 138 138  K 3.7  < > 3.9 4.2  CL 104  < > 99 99  CO2 31  < > 28 25  GLUCOSE 131*  < > 237* 262*  BUN 10  < > 20 23  CREATININE 0.77  < > 0.82 0.93  CALCIUM 9.0  < > 8.9 8.9  MG 1.3*  --  1.7  --   < > = values in this interval not displayed. CBC:  Recent Labs  09/16/13 1706  WBC 10.9*  HGB 12.5  HCT 38.3  MCV 93.4  PLT 279   Cardiac Enzymes:  Recent Labs  09/17/13 0250 09/17/13 1050  TROPONINI <0.30 <0.30   Current Meds: . aspirin EC  81 mg Oral Daily  . atenolol  100 mg Oral q morning - 10a  . atorvastatin  40 mg Oral q1800  . benazepril  40 mg Oral Daily  . buPROPion  150 mg Oral BID  . enoxaparin (LOVENOX) injection  65 mg Subcutaneous Q24H  . furosemide  40 mg Intravenous Q12H  . insulin aspart  0-15 Units Subcutaneous TID WC  . insulin aspart protamine- aspart  15 Units Subcutaneous BID WC  . metFORMIN  1,000 mg Oral BID WC  . pantoprazole  40 mg Oral BID  . pregabalin  200 mg Oral TID  . silver sulfADIAZINE   Topical Daily  . sodium chloride  3 mL Intravenous Q12H  . sodium chloride  3 mL Intravenous Q12H  . traZODone   100 mg Oral QHS   Echo 09/17/13: Left ventricle: The cavity size was normal. There was severe concentric hypertrophy. Systolic function was vigorous. The estimated ejection fraction was in the range of 65% to 70%. Wall motion was normal; there were no regional wall motion abnormalities. Doppler parameters are consistent with abnormal left ventricular relaxation (grade 1 diastolic dysfunction). - Aortic valve: Valve area: 1.58cm^2(VTI). Valve area: 1.59cm^2 (Vmax). - Left atrium: The atrium was mildly dilated. - Atrial septum: No defect or patent foramen ovale was identified.  ASSESSMENT AND PLAN:  1. Chest pain: She has ruled out for MI with serial cardiac markers. Still having continuous chest pain that is reproducible to palpation. Day two of myoview study today to exclude ischemia. If no ischemia, would pursue GI workup. If there is ischemia then we will plan cardiac cath tomorrow.   2. Hypertensive heart disease: Severe LVH on echo. Continue to titrate BP meds.   3. Acute diastolic CHF:  Currently on IV Lasix  with 4.0 Liters diuresis since admission. Would continue IV Lasix today. Renal function stable.    Jennifer [name]  9/29/20147:32 AM

## 2013-09-19 NOTE — H&P (Signed)
I have already written my HP for this patient (Please see my note). I saw and evaluated the patient.  I personally confirmed the key portions of the history and exam documented by Dr. Mariea Clonts and I reviewed pertinent patient test results.  I agree with the documentation in the resident's note.

## 2013-09-19 NOTE — Consult Note (Addendum)
WOC consult Note Reason for Consult:Consult requested for BLE.  Pt is followed as an outpatient by Dr Lajoyce Corners and was due for a follow-up appointment with him tomorrow at his office.  She appears to be well-informed regarding topical treatment and the plan of care he has ordered. She has just arrived back from a procedure and declines offer to perform wound assessment or change dressings.  She requests that bedside nurse perform this task later.   She would like Dr Lajoyce Corners to assess her wounds while she is in the hospital if that is possible, since she will not be able to keep her previously scheduled appointment. 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  Dressing procedure/placement/frequency:She states that her feet are to be soaked in warm, soapy water Q day for about 5 min, then patted dry, and Silvadene applied with 4X4, abd pad, kerlex, and ace wraps. Silvadene has already been ordered prior to the [facility] consult. She plans to resume follow-up with Dr Lajoyce Corners after discharge if he is unable to see her during her stay.  Please re-consult if further assistance is needed.  Thank-you,  Cammie Mcgee MSN, RN, CWOCN, [place], CNS [phone]

## 2013-09-19 NOTE — Consult Note (Signed)
[facility] Consult: 11:48 AM 09/19/2013   Referring Provider: Dr Rogelia Boga  Primary Care Physician:  Blanch Media, MD Primary Gastroenterologist:  Dr. Melvia Heaps   Reason for Consultation:  Chest pain   HPI: Jennifer [name] is a morbidly obese 52 y.o. female.  Hx of IDDM, ASPVD, COPD, diabetic neuropathy, foot osteomyelitis, diastolic heart failure, infectious endocarditis.  S/p 2012 dilatation of esophageal stricture.  HP colon polyps 2012.  Chronic pain.  S/p ray toe amputations # 4 and 5 of left foot 08/31/13.   Admitted 9/28 with recurrent episodes of substernal chest pain on dozens of occasions in last 2 weeks.  Radiated to neck and ear.  Ruled out for MI, in progress with nuclear stresss test at present.  2 weeks of the non-exertional chest pain, occurs night, day, no relation to po intake. Last week had n/v for about 5 days, unusual for her.  It has resolved.  Also having diarrhea of watery stool since 9/27.  Saw small amount red blood with wiping once, no recurrence.  No significant dysphagia.  Takes Nexium 20 mg once daily.  Takes about 6 oxycodone daily, no NSAIDs.  Normally BMs are formed about 2 to 3 x daily.      Past Medical History  Diagnosis Date  . Depression   . GERD (gastroesophageal reflux disease)   . Hyperlipidemia   . Hypertension   . Glaucoma   . PVD (peripheral vascular disease) with claudication     Stents to bilateral common iliac arteries (left 2005, right 2008), on chronic plavix  . Diabetic peripheral neuropathy   . Hyperplastic colon polyp 12/2010    Per colonoscopy (12/2010) - Dr. Arlyce Dice  . Asthma   . COPD 01/08/2007    PFT's 05/2007 : FEV1/FVC 82, FEV1 64% pred, FEF 25-75% 40% predicted, 16% improvement in FEV1 with bronchodilators.     . Type II diabetes mellitus with peripheral circulatory disorders, uncontrolled (250.72) DX: 1993    Insulin dep. Poor control. Complicated by diabetic foot  ulcer and diabetic eye disease.    . Tobacco abuse   . Chronic osteomyelitis of foot     chronic, right secondary to diabetic foot ulcers  . Polymicrobial bacterial infection 01/2013    GBS and S. aureus bacteremia // Source likely infected diabetic foot ulcer  . 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 Blood cultures 01/26/13 S. Aureus and GBS. Blood cultures Feb 6th, 8th, and 9th and March were negative.Repeat TEE 3/20 negative for vegitations  . 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 Chronic diastolic heart failure     grade 2 per 2D echocardiogram (01/2013)  . Ulcer of foot, chronic     Left. No OM per MRI (01/2013)  . DVT of upper extremity (deep vein thrombosis) 03/11/2013    Secondary to PICC line. Right brachial vein, diagnosed on 03/10/2013 Coumadin for 3 months. End date 06/10/2013   . Chronic pain syndrome 12/03/2011    Likely secondary to depression, "fibromyalgia", neuropathy, and obesity. Lumbar MRI 2014 no sig change from prior (2008) : Stable hypertrophic facet disease most notable at L4-5. Stable shallow left foraminal/extraforaminal disc protrusion at L4-5. No direct neural compression.      . Environmental allergies     Hx: of    Past Surgical History  Procedure Laterality Date  . Abdominal hysterectomy  1997    secondary to uterine fibroids  . Tubal ligation    .  Rotator cuff repair      bilateral  . Breast biopsy      multiple-benign per pt  . Wrist surgery      right  . Ganglion cyst excision      multiple  . Other surgical history      stents in lower ext  . Tee without cardioversion N/A 01/31/2013    Procedure: TRANSESOPHAGEAL ECHOCARDIOGRAM (TEE);  Surgeon: Pricilla Riffle, MD;  Location: [facility] ENDOSCOPY;  Service: Cardiovascular;  Laterality: N/A;  Rm [phone]  . Bladder surgery      bladder reconstruction surgery  . Tee without cardioversion N/A 03/10/2013    Procedure: TRANSESOPHAGEAL  ECHOCARDIOGRAM (TEE);  Surgeon: Laurey Morale, MD;  Location: [facility] ENDOSCOPY;  Service: Cardiovascular;  Laterality: N/A;  Rm. [phone]  . Skin grafts Bilateral 05/13/2013    Dr Lajoyce Corners  . Skin split graft Bilateral 05/13/2013    Procedure: Right and Left Foot Allograft Skin Graft;  Surgeon: Nadara Mustard, MD;  Location: [facility] OR;  Service: Orthopedics;  Laterality: Bilateral;  Right and Left Foot Allograft Skin Graft  . Toe amputation Left 08/31/2013    4TH & 5 TH TOE   . Amputation Left 08/31/2013    Procedure: AMPUTATION RAY;  Surgeon: Nadara Mustard, MD;  Location: [facility] OR;  Service: Orthopedics;  Laterality: Left;  Left Foot 5th Ray Amputation    Prior to Admission medications   Medication Sig Start Date End Date Taking? Authorizing Provider  albuterol (PROAIR HFA) 108 (90 BASE) MCG/ACT inhaler Inhale 2 puffs into the lungs every 6 (six) hours as needed for wheezing. 06/15/13  Yes Burns Spain, MD  albuterol (PROVENTIL) (5 MG/ML) 0.5% nebulizer solution Take 0.5 mLs (2.5 mg total) by nebulization every 6 (six) hours as needed for wheezing or shortness of breath. 06/15/13  Yes Burns Spain, MD  aspirin EC 81 MG tablet Take 1 tablet (81 mg total) by mouth every morning. 06/15/13  Yes Burns Spain, MD  atenolol (TENORMIN) 100 MG tablet Take 1 tablet (100 mg total) by mouth every morning. 06/15/13  Yes Burns Spain, MD  baclofen (LIORESAL) 10 MG tablet Take 1 tablet (10 mg total) by mouth 3 (three) times daily. 08/25/13  Yes Burns Spain, MD  benazepril (LOTENSIN) 40 MG tablet Take 40 mg by mouth daily.   Yes Historical Provider, MD  buPROPion (WELLBUTRIN SR) 150 MG 12 hr tablet Take 1 tablet (150 mg total) by mouth 2 (two) times daily. 06/15/13 06/15/14 Yes Burns Spain, MD  diphenhydrAMINE (BENADRYL) 25 MG tablet Take 25 mg by mouth every 6 (six) hours.  05/03/13  Yes Genelle Gather, MD  esomeprazole (NEXIUM) 20 MG capsule Take 1 capsule (20 mg total) by mouth daily before  breakfast. 06/15/13  Yes Burns Spain, MD  fluticasone [facility]) 50 MCG/ACT nasal spray Place 2 sprays into the nose 2 (two) times daily as needed for allergies. 06/15/13  Yes Burns Spain, MD  Fluticasone-Salmeterol (ADVAIR DISKUS) 250-50 MCG/DOSE AEPB Inhale 1 puff into the lungs 2 (two) times daily. 06/15/13  Yes Burns Spain, MD  furosemide (LASIX) 40 MG tablet Take 40 mg by mouth 2 (two) times daily. Take 1 tablet in the morning and 1 in the afternoon 06/15/13  Yes Burns Spain, MD  glucose blood (ONETOUCH VERIO) test strip Check blood sugar up to 4 times a day before meals and bedtime Dx code- 250.72 insulin-requiring 05/30/13  Yes Bronson Curb, MD  glucose blood test strip Check blood sugar up to 4 times a day before meals and bedtime Dx code- 250.72, insulin requiring 03/03/13  Yes Burns Spain, MD  insulin lispro protamine-lispro (HUMALOG 75/25) (75-25) 100 UNIT/ML SUSP injection Inject 75-140 Units into the skin 2 (two) times daily with a meal. Inject 140 units in the morning and 75 units in the evening   Yes Historical Provider, MD  ipratropium (ATROVENT) 0.02 % nebulizer solution Take 2.5 mLs (0.5 mg total) by nebulization every 6 (six) hours as needed for wheezing. 06/15/13  Yes Burns Spain, MD  metFORMIN (GLUCOPHAGE) 1000 MG tablet Take 1 tablet (1,000 mg total) by mouth 2 (two) times daily. 06/15/13  Yes Burns Spain, MD  [facility] DELICA LANCETS FINE MISC 1 each by Does not apply route 2 (two) times daily. Check blood sugar up to 4 times a day before meals and bedtime Dx code- 250.72, insulin requiring From 03/03/2013 05/30/13  Yes Bronson Curb, MD  oxyCODONE-acetaminophen (ROXICET) 5-325 MG per tablet Take 2 tablets by mouth every 6 (six) hours as needed for pain. 08/02/13 08/02/14 Yes Burns Spain, MD  pravastatin (PRAVACHOL) 40 MG tablet Take 1 tablet (40 mg total) by mouth every morning. 06/15/13  Yes Burns Spain, MD  pregabalin  (LYRICA) 200 MG capsule Take 200 mg by mouth 3 (three) times daily.   Yes Historical Provider, MD  silver sulfADIAZINE (SILVADENE) 1 % cream Apply 1 application topically daily.  05/20/13  Yes Historical Provider, MD  travoprost, benzalkonium, (TRAVATAN) 0.004 % ophthalmic solution 1 drop at bedtime.   Yes Historical Provider, MD  traZODone (DESYREL) 100 MG tablet Take 1 tablet (100 mg total) by mouth at bedtime. 06/15/13  Yes Burns Spain, MD    Scheduled Meds: . aspirin EC  81 mg Oral Daily  . atenolol  100 mg Oral q morning - 10a  . atorvastatin  40 mg Oral q1800  . benazepril  40 mg Oral Daily  . buPROPion  150 mg Oral BID  . enoxaparin (LOVENOX) injection  65 mg Subcutaneous Q24H  . furosemide  40 mg Intravenous Q12H  . insulin aspart  0-15 Units Subcutaneous TID WC  . insulin aspart protamine- aspart  15 Units Subcutaneous BID WC  . metFORMIN  1,000 mg Oral BID WC  . pantoprazole  40 mg Oral BID  . pregabalin  200 mg Oral TID  . regadenoson      . silver sulfADIAZINE   Topical Daily  . sodium chloride  3 mL Intravenous Q12H  . sodium chloride  3 mL Intravenous Q12H  . traZODone  100 mg Oral QHS   Infusions:   PRN Meds: sodium chloride, albuterol, diphenhydrAMINE, gi cocktail, ondansetron (ZOFRAN) IV, ondansetron, oxyCODONE-acetaminophen, sodium chloride   Allergies as of 09/16/2013 - Review Complete 09/16/2013  Allergen Reaction Noted  . Iohexol  08/17/2007  . Ivp dye [iodinated diagnostic agents]  05/10/2013  . Morphine sulfate Itching and Rash     Family History  Problem Relation Age of Onset  . Diverticulosis Mother   . Diabetes Mother   . Hypertension Mother   . Congestive Heart Failure Mother   . Asthma Father   . CAD Sister 49    MI at age 45 per patient.  However, she has not had a stent or CABG.     History   Social History  . Marital Status: Single    Spouse Name: N/A    Number of Children: 2  .  Years of Education: college   Occupational  History  . Disability     previously worked as a Psychologist, counselling History Main Topics  . Smoking status: Current Every Day Smoker -- 0.50 packs/day for 40 years    Types: Cigarettes  . Smokeless tobacco: Never Used     Comment: Half pack or less. Smokes anpack per week.      '  I already have information on quitting "  . Alcohol Use: No  . Drug Use: No     Comment: none in many years; no thc x2 years, no crack/cocaine 1989  . Sexual Activity: Not on file     Comment: states she is currently trying to quit smoking   Other Topics Concern  . Not on file   Social History Narrative   On disability. Lives with son in [place]. 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    Boyfriend passed away stage 4 cancer 12-Mar-2013.    REVIEW OF SYSTEMS: Constitutional:  Feels like her weight is stable No sick contacts ENT:  No nasal drainage Pulm:  No  CV:  Per HPI GU:  No dysuria, no foul smell to urined, no frequency.  GI:  Per HPI Heme:  No unusual bleeding or bruising.    Transfusions:  None ever Neuro:  Numbness in feet Derm:  No rash.  Takes benadryl for oxy induced  Endocrine:  No sweats or chills. No polydypsia or polyuria Immunization:  09/01/13 flu shot Travel:  none   PHYSICAL EXAM: Vital signs in last 24 hours: Temp:  [97.9 F (36.6 C)-98.5 F (36.9 C)] 98.3 F (36.8 C) (09/29 0543) Pulse Rate:  [63-74] 69 (09/29 0543) Resp:  [17-18] 18 (09/29 0543) BP: (110-146)/(50-65) 146/65 mmHg (09/29 0543) SpO2:  [91 %-97 %] 97 % (09/29 0543) Weight:  [129.6 kg (285 lb 11.5 oz)] 129.6 kg (285 lb 11.5 oz) (09/29 0543)  General: chronically ill looking, obese AAF Head:  Facial obesity. No asymmetry  Eyes:  No icterus or pallor Ears:  Not HOH  Nose:  No discharge. Mouth:  Clear ioral mm Voice:  Hoarse, raspy Neck:  No jvd or mass Lungs:  Clear bil.  No cough Heart: RRR.  No MRG Abdomen:  Soft, NT, ND.  Obese.  No mass or HSM.   Rectal: not done   Musc/Skeltl: deformed feet bil with marked  loss of arches:   Looks like Charcot foot. 4th, 5th toe on left are amputated Extremities:  + bil,  Non pitting pedal edema Neurologic:  Oriented x 3.  No tremor.  Moves all 4s.   Skin:  No rash or sores.   Tattoos:  none Nodes:  No cervical adenopathy   Psych:  Pleasant, cooperative, not anxious.   Intake/Output from previous day: 09/28 0701 - 09/29 0700 In: 613 [P.O.:610; I.V.:3] Out: 2275 [Urine:2275] Intake/Output this shift: Total I/O In: 0  Out: 1200 [Urine:1200]  LAB RESULTS:  Recent Labs  09/16/13 1706  WBC 10.9*  HGB 12.5  HCT 38.3  PLT 279   BMET Lab Results  Component Value Date   NA 138 09/19/2013   NA 138 09/18/2013   NA 144 09/16/2013   K 4.2 09/19/2013   K 3.9 09/18/2013   K 3.3* 09/16/2013   CL 99 09/19/2013   CL 99 09/18/2013   CL 101 09/16/2013   CO2 25 09/19/2013   CO2 28 09/18/2013   CO2 33* 09/16/2013   GLUCOSE 262* 09/19/2013   GLUCOSE 237*  09/18/2013   GLUCOSE 57* 09/16/2013   BUN 23 09/19/2013   BUN 20 09/18/2013   BUN 11 09/16/2013   CREATININE 0.93 09/19/2013   CREATININE 0.82 09/18/2013   CREATININE 0.79 09/16/2013   CALCIUM 8.9 09/19/2013   CALCIUM 8.9 09/18/2013   CALCIUM 9.2 09/16/2013   LFT No results found for this basename: PROT, ALBUMIN, AST, ALT, ALKPHOS, BILITOT, BILIDIR, IBILI,  in the last 72 hours PT/INR Lab Results  Component Value Date   INR 1.19 08/31/2013   INR 1.80 05/30/2013   INR 1.06 05/14/2013    Drugs of Abuse     Component Value Date/Time   LABOPIA NONE DETECTED 09/17/2013 0521   LABOPIA PPS 08/02/2013 1015   COCAINSCRNUR NONE DETECTED 09/17/2013 0521   COCAINSCRNUR NEG 08/02/2013 1015   LABBENZ NONE DETECTED 09/17/2013 0521   LABBENZ NEG 08/02/2013 1015   LABBENZ NEG 06/28/2010 2130   AMPHETMU NONE DETECTED 09/17/2013 0521   AMPHETMU NEG 06/28/2010 2130   THCU NONE DETECTED 09/17/2013 0521   LABBARB NONE DETECTED 09/17/2013 0521   LABBARB NEG 08/02/2013 1015     RADIOLOGY STUDIES: No results found.  ENDOSCOPIC  STUDIES: 01/15/2011  Colonoscopy For eval of recurrent rectal abcesses, rule out IBD Sigmoid polyps.  Path:  Hyperplastic.  Diverticulosis.   01/15/11 EGD For  Dysphagia Distal esophageal stricture dilated.  Study o/w normal   IMPRESSION: *  Atypical substernal chest pain.  Does not sound cardiac though pt has extensive rish factors for CAD.  nuc stress test in progress Hx esophageal stricture dilated 2010.  Occasional dysphagia to solids/liquids.  ? Candida esophagitis, ? Uncontrolled GERD.  Previous PPI Rx with low dose Nexium.  *  Diarrhea.  *  IDDM *  Recent amputations left toes 08/31/2013 .  ASPVD.  Still smokes.  *  Hx HP colono polyps and diverticulosis on 12/2010 colonoscopy.    PLAN: *  Will order C diff.  Check LFTs in AM *  ? EGD ?  I set this up for tomorrow at 11 AM. .  *  Agree with upping dose of Protonix to BID.   LOS: 3 days   [name]  09/19/2013, 11:48 AM Pager: [phone]

## 2013-09-19 NOTE — Progress Notes (Signed)
Attempted to help educate pt about diabetes. Says she does not follow a diet per say. Does not carb count. Eats whatever she wants. Asking staff for juice and nondiet sodas. Printed off exit care notes for Sick day management, 1800 cal ADA diet, Diab eating away from home, Food labeling, small vessel disease & Diabetes. Pt did not care to watch Diabetic videos on pt Education channel.

## 2013-09-19 NOTE — Progress Notes (Signed)
  Date: 09/19/2013  Patient name: Jennifer [name]  Medical record number: [id number]  Date of birth: September 14, 1961   This patient has been seen and the plan of care was discussed with the house staff. Please see their note for complete details. I concur with their findings with the following additions/corrections: Dat two of her Celine Ahr is today. Results pending. She c/o 2 weeks of pain / burning from chest to mouth to L ear. Her throat is clear. She also c/o intermittent dysphagia to liquids and solids and pills. Weight is stable. Recent 2 wks of D, now resolved. Intermittent N & V. Will ask GI to eval pt to see if an EGD is indicated due to the c/o ongoing chest pain / burning despite PPI.   Burns Spain, MD 09/19/2013, 12:08 PM

## 2013-09-19 NOTE — Addendum Note (Signed)
Addended by: Neomia Dear on: 09/19/2013 03:16 PM   Modules accepted: Orders

## 2013-09-20 ENCOUNTER — Encounter (HOSPITAL_COMMUNITY): Payer: Self-pay | Admitting: Internal Medicine

## 2013-09-20 ENCOUNTER — Other Ambulatory Visit: Payer: Self-pay | Admitting: Internal Medicine

## 2013-09-20 ENCOUNTER — Encounter (HOSPITAL_COMMUNITY)
Admission: EM | Disposition: A | Discharge status: Home / Self Care | Payer: Self-pay | Source: Home / Self Care | Attending: Internal Medicine

## 2013-09-20 HISTORY — PX: ESOPHAGOGASTRODUODENOSCOPY: SHX5428

## 2013-09-20 LAB — GLUCOSE, CAPILLARY
Glucose-Capillary: 161 mg/dL — ABNORMAL HIGH (ref 70–99)
Glucose-Capillary: 163 mg/dL — ABNORMAL HIGH (ref 70–99)
Glucose-Capillary: 180 mg/dL — ABNORMAL HIGH (ref 70–99)
Glucose-Capillary: 228 mg/dL — ABNORMAL HIGH (ref 70–99)
Glucose-Capillary: 238 mg/dL — ABNORMAL HIGH (ref 70–99)
Glucose-Capillary: 252 mg/dL — ABNORMAL HIGH (ref 70–99)
Glucose-Capillary: 280 mg/dL — ABNORMAL HIGH (ref 70–99)

## 2013-09-20 LAB — COMPREHENSIVE METABOLIC PANEL
ALT: 31 U/L (ref 0–35)
AST: 41 U/L — ABNORMAL HIGH (ref 0–37)
Albumin: 3.1 g/dL — ABNORMAL LOW (ref 3.5–5.2)
Alkaline Phosphatase: 77 U/L (ref 39–117)
BUN: 18 mg/dL (ref 6–23)
CO2: 29 mEq/L (ref 19–32)
Calcium: 8.9 mg/dL (ref 8.4–10.5)
Chloride: 97 mEq/L (ref 96–112)
Creatinine, Ser: 0.85 mg/dL (ref 0.50–1.10)
GFR calc Af Amer: 88 mL/min — ABNORMAL LOW (ref >90)
GFR calc non Af Amer: 76 mL/min — ABNORMAL LOW (ref >90)
Glucose, Bld: 234 mg/dL — ABNORMAL HIGH (ref 70–99)
Potassium: 4 mEq/L (ref 3.5–5.1)
Sodium: 138 mEq/L (ref 135–145)
Total Bilirubin: 0.2 mg/dL — ABNORMAL LOW (ref 0.3–1.2)
Total Protein: 6.8 g/dL (ref 6.0–8.3)

## 2013-09-20 LAB — CLOSTRIDIUM DIFFICILE BY PCR: Toxigenic C. Difficile by PCR: NEGATIVE

## 2013-09-20 LAB — LIPASE, BLOOD: Lipase: 14 U/L (ref 11–59)

## 2013-09-20 SURGERY — EGD (ESOPHAGOGASTRODUODENOSCOPY)
Anesthesia: Moderate Sedation

## 2013-09-20 MED ORDER — MIDAZOLAM HCL 5 MG/ML IJ SOLN
INTRAMUSCULAR | Status: AC
  Filled 2013-09-20: qty 2

## 2013-09-20 MED ORDER — AMLODIPINE BESYLATE 2.5 MG PO TABS
2.5000 mg | ORAL_TABLET | Freq: Every day | ORAL | Status: DC
  Administered 2013-09-20 – 2013-09-21 (×2): 2.5 mg via ORAL
  Filled 2013-09-20 (×2): qty 1

## 2013-09-20 MED ORDER — MIDAZOLAM HCL 10 MG/2ML IJ SOLN
INTRAMUSCULAR | Status: DC | PRN
  Administered 2013-09-20 (×3): 1 mg via INTRAVENOUS
  Administered 2013-09-20 (×2): 2 mg via INTRAVENOUS

## 2013-09-20 MED ORDER — SUCRALFATE 1 G PO TABS
1.0000 g | ORAL_TABLET | Freq: Three times a day (TID) | ORAL | Status: DC
  Administered 2013-09-20: 1 g via ORAL
  Filled 2013-09-20 (×9): qty 1

## 2013-09-20 MED ORDER — FENTANYL CITRATE 0.05 MG/ML IJ SOLN
INTRAMUSCULAR | Status: AC
  Filled 2013-09-20: qty 2

## 2013-09-20 MED ORDER — DIPHENHYDRAMINE HCL 50 MG/ML IJ SOLN
INTRAMUSCULAR | Status: AC
  Filled 2013-09-20: qty 1

## 2013-09-20 MED ORDER — FENTANYL CITRATE 0.05 MG/ML IJ SOLN
INTRAMUSCULAR | Status: DC | PRN
  Administered 2013-09-20 (×2): 25 ug via INTRAVENOUS
  Administered 2013-09-20: 10 ug via INTRAVENOUS

## 2013-09-20 MED ORDER — FUROSEMIDE 40 MG PO TABS
40.0000 mg | ORAL_TABLET | Freq: Two times a day (BID) | ORAL | Status: DC
  Administered 2013-09-20 – 2013-09-21 (×4): 40 mg via ORAL
  Filled 2013-09-20 (×5): qty 1

## 2013-09-20 MED ORDER — BUTAMBEN-TETRACAINE-BENZOCAINE 2-2-14 % EX AERO
INHALATION_SPRAY | CUTANEOUS | Status: DC | PRN
  Administered 2013-09-20: 2 via TOPICAL

## 2013-09-20 NOTE — Progress Notes (Signed)
   SUBJECTIVE:   She still hurts in her chest.     PHYSICAL EXAM Filed Vitals:   09/19/13 1155 09/19/13 1448 09/19/13 2130 09/20/13 0439  BP: 162/60 173/67 145/71 169/82  Pulse: 80 85 75 76  Temp:  98.6 F (37 C) 97.7 F (36.5 C) 98.1 F (36.7 C)  TempSrc:  Oral Oral Oral  Resp:  18 18 20   Height:      Weight:    281 lb (127.461 kg)  SpO2:  100% 96% 91%   General:  No distress Lungs:  Clear Heart:  RRR Abdomen:  Positive bowel sounds, no rebound no guarding Extremities:  Edema reduced.   LABS: Lab Results  Component Value Date   TROPONINI <0.30 09/17/2013   Results for orders placed during the hospital encounter of 09/16/13 (from the past 24 hour(s))  GLUCOSE, CAPILLARY     Status: Abnormal   Collection Time    09/19/13  4:41 PM      Result Value Range   Glucose-Capillary 329 (*) 70 - 99 mg/dL  GLUCOSE, CAPILLARY     Status: Abnormal   Collection Time    09/19/13  9:27 PM      Result Value Range   Glucose-Capillary 163 (*) 70 - 99 mg/dL   Comment 1 Notify RN     Comment 2 Documented in Chart    GLUCOSE, CAPILLARY     Status: Abnormal   Collection Time    09/20/13 12:36 AM      Result Value Range   Glucose-Capillary 161 (*) 70 - 99 mg/dL   Comment 1 Notify RN    GLUCOSE, CAPILLARY     Status: Abnormal   Collection Time    09/20/13  4:37 AM      Result Value Range   Glucose-Capillary 180 (*) 70 - 99 mg/dL   Comment 1 Notify RN      Intake/Output Summary (Last 24 hours) at 09/20/13 0801 Last data filed at 09/20/13 1610  Gross per 24 hour  Intake    660 ml  Output   3400 ml  Net  -2740 ml     ASSESSMENT AND PLAN:  CHEST PAIN:  No evidence of ischemia.  EF 65%.  No further ischemia work up. She is still having atypical pain however.   HTN:  BP is high normal.  I will add Norvasc.   EDEMA:  Good urine output.  Labs OK.  I will switch to PO Lasix.        Fayrene Fearing [facility] 09/20/2013 8:01 AM

## 2013-09-20 NOTE — Progress Notes (Signed)
RD attempted to meet pt twice today to provide diet education. Pt unavailable at both attempts. Will attempt follow-up to provide diet education tomorrow morning. Ian Malkin RD, LDN Inpatient Clinical Dietitian Pager: [phone] After Hours Pager: [phone]

## 2013-09-20 NOTE — Progress Notes (Signed)
Dressing changed completed. Both feet soaked in warm soapy water for about 10 min each. Silver Sulfadiazine applied, abd pad applied to wounds and wrapped with kerlex and ace bandage. Patient complains of no pain or discomfort at this time will continue to monitor to end of shift.

## 2013-09-20 NOTE — Telephone Encounter (Signed)
She is in the hospital and I cannot review meds to see last refill.

## 2013-09-20 NOTE — Progress Notes (Signed)
1600 report received from Rashida ,Rn Pt is fully awake alert and oriented . Np apparent distress .

## 2013-09-20 NOTE — Progress Notes (Signed)
1930 Famil;y at the bedside . Pleasant . Pt no complaint presented

## 2013-09-20 NOTE — Progress Notes (Signed)
  Date: 09/20/2013  Patient name: JOELEE SNOKE  Medical record number: [id number]  Date of birth: 1961/03/16   This patient has been seen and the plan of care was discussed with the house staff. Please see their note for complete details. I concur with their findings with the following additions/corrections: Ms Wimes still c/o substernal to throat burning pain, unchanged since admission. EGD just shows nodular gastritis that wouldn't explain her sxs. She has been R/O for : ACS / angina with myoview, PE with VQ scan, GI causes with EGD and is on a PPI, CAP with CXR, compression fx with lateral view CXR, gall bladder dz with LFT's. We can add a Lipase. Her pain seems GI - burning from stomach to mouth. She might have eso hypersensitivity but is already on a good dose of Trazadone and I would hate to add more meds to her polypharmacy. I can tell her wait her pain isn't but not what it is. Likely D/C today or tomorrow despite no relief of her pain with outpt F/U.   Burns Spain, MD 09/20/2013, 1:02 PM

## 2013-09-20 NOTE — Progress Notes (Signed)
1737 Received from ED via stretcher . Denied discomfort . On heparin @1400  units piggybacked with Tridil drip @15  mcg/

## 2013-09-20 NOTE — Progress Notes (Signed)
Pt a/o, no c/o pain, ace wraps applied to bilat feet, pt NPO for endo in the am, will continue to monitor

## 2013-09-20 NOTE — Op Note (Signed)
[name] [address] [place], [address]   ENDOSCOPY PROCEDURE REPORT  PATIENT: Jennifer, [name]  MR#: [id number] BIRTHDATE: Mar 19, 1961 , 52  yrs. old GENDER: Female ENDOSCOPIST: Beverley Fiedler, MD REFERRED BY:  Blanch Media, M.D. PROCEDURE DATE:  09/20/2013 PROCEDURE:  EGD w/ biopsy for H.pylori ASA CLASS:     Class III INDICATIONS:  Chest pain. MEDICATIONS: These medications were titrated to patient response per physician's verbal order, Fentanyl-Detailed 60 mg IV, and Versed 7 mg IV TOPICAL ANESTHETIC: Cetacaine Spray  DESCRIPTION OF PROCEDURE: After the risks benefits and alternatives of the procedure were thoroughly explained, informed consent was obtained.  The Pentax Gastroscope [id number] endoscope was introduced through the mouth and advanced to the second portion of the duodenum. Without limitations.  The instrument was slowly withdrawn as the mucosa was fully examined.    ESOPHAGUS: The mucosa of the esophagus appeared normal.   A normal Z-line was observed 39 cm from the incisors.  STOMACH: Mild nodular gastritis (inflammation) was found in the gastric body and gastric antrum.  Biopsies were taken in the body, antrum and angularis.  DUODENUM: The duodenal mucosa showed no abnormalities in the bulb and second portion of the duodenum.  Retroflexed views revealed no abnormalities.     The scope was then withdrawn from the patient and the procedure completed.  COMPLICATIONS: There were no complications.  ENDOSCOPIC IMPRESSION: 1.   The mucosa of the esophagus appeared normal; Regular Z-line was observed 39 cm from the incisors 3.   Nodular gastritis (inflammation) was found in the gastric body and gastric antrum; biopsies were taken in the antrum and angularis  4.   The duodenal mucosa showed no abnormalities in the bulb and second portion of the duodenum 5.   No findings to explain atypical chest pain  RECOMMENDATIONS: 1.   Await pathology results 2.  Follow-up of helicobacter pylori status, treat if indicated 3.  Once daily PPI  eSigned:  Beverley Fiedler, MD 09/20/2013 11:41 AM   CC:The Patient  PATIENT NAME:  Jennifer, [name] MR#: [id number]

## 2013-09-20 NOTE — Progress Notes (Signed)
Subjective: The patient continues to note abdominal/chest pain, described as a "burning" sensation originating in her stomach, and traveling to her throat.  EGD today showed some nodular gastritis, but no esophageal abnormalities.  Objective: Vital signs in last 24 hours: Filed Vitals:   09/20/13 1150 09/20/13 1200 09/20/13 1210 09/20/13 1249  BP: 177/93 143/103 157/139 150/92  Pulse: 73 73 77   Temp:      TempSrc:      Resp: 13 15 19    Height:      Weight:      SpO2: 95% 95% 95%    Weight change: -4 lb 11.5 oz (-2.139 kg)  Intake/Output Summary (Last 24 hours) at 09/20/13 1512 Last data filed at 09/20/13 0901  Gross per 24 hour  Intake    420 ml  Output   2800 ml  Net  -2380 ml   Physical Exam-  General: alert, cooperative, and in no apparent distress HEENT: pupils equal round and reactive to light, vision grossly intact, oropharynx clear and non-erythematous  Neck: supple Lungs: clear to ascultation bilaterally, normal work of respiration, no wheezes, rales, ronchi Heart: regular rate and rhythm, no murmurs, gallops, or rubs Abdomen: soft, non-tender, non-distended, normal bowel sounds Extremities: no cyanosis, clubbing, or edema Neurologic: alert & oriented X3, cranial nerves II-XII intact, strength grossly intact, sensation intact to light touch  Lab Results: Basic Metabolic Panel:  Recent Labs Lab 09/16/13 1354  09/18/13 0535 09/19/13 0510 09/20/13 0655  NA 146*  < > 138 138 138  K 3.7  < > 3.9 4.2 4.0  CL 104  < > 99 99 97  CO2 31  < > 28 25 29   GLUCOSE 131*  < > 237* 262* 234*  BUN 10  < > 20 23 18   CREATININE 0.77  < > 0.82 0.93 0.85  CALCIUM 9.0  < > 8.9 8.9 8.9  MG 1.3*  --  1.7  --   --   < > = values in this interval not displayed. CBC:  Recent Labs Lab 09/16/13 1706  WBC 10.9*  HGB 12.5  HCT 38.3  MCV 93.4  PLT 279   Cardiac Enzymes:  Recent Labs Lab 09/17/13 0250 09/17/13 1050  TROPONINI <0.30 <0.30   BNP:  Recent Labs Lab  09/16/13 1706  PROBNP 256.9*   D-Dimer:  Recent Labs Lab 09/16/13 1706  DDIMER 1.38*   CBG:  Recent Labs Lab 09/19/13 1641 09/19/13 2127 09/20/13 0036 09/20/13 0437 09/20/13 0812 09/20/13 1232  GLUCAP 329* 163* 161* 180* 252* 238*   Hemoglobin A1C:  Recent Labs Lab 09/16/13 1406  HGBA1C 8.0   Urine Drug Screen: Drugs of Abuse     Component Value Date/Time   LABOPIA NONE DETECTED 09/17/2013 0521   LABOPIA PPS 08/02/2013 1015   COCAINSCRNUR NONE DETECTED 09/17/2013 0521   COCAINSCRNUR NEG 08/02/2013 1015   LABBENZ NONE DETECTED 09/17/2013 0521   LABBENZ NEG 08/02/2013 1015   LABBENZ NEG 06/28/2010 2130   AMPHETMU NONE DETECTED 09/17/2013 0521   AMPHETMU NEG 06/28/2010 2130   THCU NONE DETECTED 09/17/2013 0521   LABBARB NONE DETECTED 09/17/2013 0521   LABBARB NEG 08/02/2013 1015    Studies/Results: Nm Myocar Multi W/spect W/wall Motion / Ef  09/19/2013   CLINICAL DATA:  Chest pain, tobacco use, diabetes, hypertension, elevated lipids and CHF.  EXAM: MYOCARDIAL IMAGING WITH SPECT (REST AND PHARMACOLOGIC-STRESS - 2 DAY PROTOCOL)  GATED LEFT VENTRICULAR WALL MOTION STUDY  LEFT VENTRICULAR EJECTION FRACTION  TECHNIQUE:  A 2 day protocol was utilized with the patient receiving 30 mCi of technetium 41 M sestamibi IV for both rest and stress examinations. Pharmacologic stress was performed with administration of Lexiscan.  COMPARISON:  None.  FINDINGS: Utilizing gated dated, the end-diastolic volume is estimated to be 102 mL and the end systolic volume 36 mL. Calculated ejection fraction is 65%.  Gated wall motion analysis is unremarkable and shows no regional wall motion abnormalities.  SPECT imaging shows mild distal anterior wall attenuation and thinning which may be attributable to breast soft tissue attenuation. No significant reversible ischemia was identified with pharmacologic stress.  IMPRESSION: Normal left ventricular function with quantitative ejection fraction of 65%. No evidence  of inducible myocardial ischemia with Lexiscan administration.   Electronically Signed   By: Irish Lack   On: 09/19/2013 16:50   Medications: I have reviewed the patient's current medications. Scheduled Meds: . amLODipine  2.5 mg Oral Daily  . aspirin EC  81 mg Oral Daily  . atenolol  100 mg Oral q morning - 10a  . atorvastatin  40 mg Oral q1800  . benazepril  40 mg Oral Daily  . buPROPion  150 mg Oral BID  . enoxaparin (LOVENOX) injection  65 mg Subcutaneous Q24H  . furosemide  40 mg Oral BID  . insulin aspart  0-15 Units Subcutaneous TID WC  . insulin aspart protamine- aspart  15 Units Subcutaneous BID WC  . metFORMIN  1,000 mg Oral BID WC  . pantoprazole  40 mg Oral BID  . pregabalin  200 mg Oral TID  . silver sulfADIAZINE   Topical Daily  . sodium chloride  3 mL Intravenous Q12H  . sucralfate  1 g Oral TID WC & HS  . traZODone  100 mg Oral QHS   Continuous Infusions: . sodium chloride 500 mL (09/20/13 1022)   PRN Meds:.sodium chloride, albuterol, diphenhydrAMINE, gi cocktail, ondansetron (ZOFRAN) IV, ondansetron, oxyCODONE-acetaminophen, sodium chloride Assessment/Plan:  # Chest Pain- The etiology of the patient's continued abdominal/chest pain is unclear.  However, we have effectively ruled out ACS (normal myoview), PE (low prob V/Q scan), esophageal stricture (EGD unremarkable), and compression fracture (CXR unremarkable).  The only explanation we have at this point is the gastritis seen on EGD, though it's unclear how clinically significant this is. - GI cocktail prn - Aspirin- 81 mg.  -continue protonix -check lipase today -await H Pylori results from gastric biopsies 9/30 -will add sucralfate for possible symptomatic relief of gastritis, though I don't know how beneficial this will be -as we have excluded any potential life-threatening causes of chest pain, further work-up will likely need to be completed in the outpatient setting.  # DCHF- Echo shows EF 65 -70%.  Patient home meds- Furosemide- 40mg  BID.  BNP- elevated but pt has had more elevated levels in the past - 09/16/2013- 256.9, 07/07/2013- 60.7, 01/26/2013- 727.3.  - Strict I/O's  # COPD- Home meds- Albuterol and ipratropium inhalers, And Advair.  - Ipratropium and Albuterol Nebs prn  # DM- HBA1c- 09/16/2013- 8. Home meds- Humalog- 140- Morning, 75 units at night, with Metformin- 1000 BID. Blood sugars- 178- 290. Patient is not compliant with her DM meds. - Cont Metformin 1000mg  BID and Novolog-70/30- 25units.  -SSI- M   # Loose stools - appear to be self-resolving, though still with 2 loose stools yesterday, and at least 1 episode so far today -c diff collected today, pending  # HTN- chronic, stable -continue benazepril, atenolol  # Hyperlipidemia- chronic, stable\ -  continue Pravastatin  # GERD-  -Pantoprazole 40mg  BID  # DVT ppx- Lovenox.   # CODE- FULL.   Dispo: Disposition is deferred at this time, awaiting improvement of current medical problems.  Anticipated discharge in approximately today.   The patient does have a current PCP Burns Spain, MD) and does need an [facility] hospital follow-up appointment after discharge.  .Services Needed at time of discharge: Y = Yes, Blank = No PT:   OT:   RN:   Equipment:   Other:     LOS: 4 days   Linward Headland, MD 09/20/2013, 3:12 PM

## 2013-09-21 ENCOUNTER — Encounter (HOSPITAL_COMMUNITY): Payer: Self-pay | Admitting: Internal Medicine

## 2013-09-21 DIAGNOSIS — K229 Disease of esophagus, unspecified: Secondary | ICD-10-CM

## 2013-09-21 LAB — GLUCOSE, CAPILLARY
Glucose-Capillary: 202 mg/dL — ABNORMAL HIGH (ref 70–99)
Glucose-Capillary: 205 mg/dL — ABNORMAL HIGH (ref 70–99)
Glucose-Capillary: 181 mg/dL — ABNORMAL HIGH (ref 70–99)

## 2013-09-21 MED ORDER — PANTOPRAZOLE SODIUM 40 MG PO TBEC
40.0000 mg | DELAYED_RELEASE_TABLET | Freq: Every day | ORAL | Status: DC
  Administered 2013-09-21: 40 mg via ORAL
  Filled 2013-09-21: qty 1

## 2013-09-21 NOTE — Progress Notes (Signed)
DC IV, DC Home, Non-Tele. Discharge instructions and home medications discussed with patient. Patient denied any questions or concerns at this time. Patient leaving unit via personally owned wheelchair and appears in no acute distress.

## 2013-09-21 NOTE — Progress Notes (Signed)
    SUBJECTIVE:   She still hurts in her chest.  She says that she doesn't feel any better.    PHYSICAL EXAM Filed Vitals:   09/20/13 1249 09/20/13 1520 09/20/13 2126 09/21/13 0612  BP: 150/92 149/96 132/66 150/64  Pulse:  75 70 77  Temp:  97.6 F (36.4 C) 98.1 F (36.7 C) 98.1 F (36.7 C)  TempSrc:  Oral Oral Oral  Resp:  18 16 17   Height:      Weight:    277 lb 14.4 oz (126.055 kg)  SpO2:  96% 99% 96%   General:  No distress Lungs:  Clear Heart:  RRR Abdomen:  Positive bowel sounds, no rebound no guarding Extremities:  Mild edema  LABS: Lab Results  Component Value Date   TROPONINI <0.30 09/17/2013   Results for orders placed during the hospital encounter of 09/16/13 (from the past 24 hour(s))  GLUCOSE, CAPILLARY     Status: Abnormal   Collection Time    09/20/13 12:32 PM      Result Value Range   Glucose-Capillary 238 (*) 70 - 99 mg/dL  CLOSTRIDIUM DIFFICILE BY PCR     Status: None   Collection Time    09/20/13  2:26 PM      Result Value Range   C difficile by pcr NEGATIVE  NEGATIVE  LIPASE, BLOOD     Status: None   Collection Time    09/20/13  4:00 PM      Result Value Range   Lipase 14  11 - 59 U/L  GLUCOSE, CAPILLARY     Status: Abnormal   Collection Time    09/20/13  4:26 PM      Result Value Range   Glucose-Capillary 280 (*) 70 - 99 mg/dL  GLUCOSE, CAPILLARY     Status: Abnormal   Collection Time    09/20/13  9:24 PM      Result Value Range   Glucose-Capillary 228 (*) 70 - 99 mg/dL   Comment 1 Notify RN    GLUCOSE, CAPILLARY     Status: Abnormal   Collection Time    09/21/13  6:10 AM      Result Value Range   Glucose-Capillary 202 (*) 70 - 99 mg/dL   Comment 1 Notify RN      Intake/Output Summary (Last 24 hours) at 09/21/13 0926 Last data filed at 09/21/13 0616  Gross per 24 hour  Intake    480 ml  Output   1900 ml  Net  -1420 ml     ASSESSMENT AND PLAN:  CHEST PAIN:  No evidence of ischemia.  EF 65%.  No further ischemia work up.  She is still having atypical pain however.   HTN:  BP is high normal.  Norvasc added yesterday.  Continue current meds.    EDEMA:  Good urine output.  Labs OK.  On PO Lasix.   Weight down 9 lbs since admission.    Call us with further questions.     Fayrene Fearing [facility] 09/21/2013 9:26 AM

## 2013-09-21 NOTE — Progress Notes (Signed)
  Date: 09/21/2013  Patient name: Jennifer [name]  Medical record number: [id number]  Date of birth: [birth year]   This patient has been seen and the plan of care was discussed with the house staff. Please see their note for complete details. I concur with their findings with the following additions/corrections: Patient does volunteer a history of intermittent difficulty swallowing liquids and solids. No obvious obstruction on EGD.  Agree with continued once daily PPI. She will need outpatient testing to r/o achalasia.  Chest pain is non-cardiac.  Medically stable for D/C home.  Jonah Blue, DO, FACP [facility] 09/21/2013, 2:01 PM

## 2013-09-21 NOTE — Discharge Summary (Signed)
Name: Jennifer [name] MRN: [id number] DOB: 08-20-1961 52 y.o. PCP: Burns Spain, MD  Date of Admission: 09/16/2013  7:41 PM Date of Discharge: 09/21/2013 Attending Physician: Burns Spain, MD  Discharge Diagnosis:  Principal Problem:   Chest pain Active Problems:   Type II diabetes mellitus with peripheral circulatory disorders, uncontrolled (250.72)   Hyperlipidemia   HYPERTENSION   COPD   Severe obesity (BMI >= 40)   Acute on chronic diastolic CHF (congestive heart failure), NYHA class 3  Discharge Medications:   Medication List    STOP taking these medications       aspirin EC 81 MG tablet      TAKE these medications       albuterol 108 (90 BASE) MCG/ACT inhaler  Commonly known as:  PROAIR HFA  Inhale 2 puffs into the lungs every 6 (six) hours as needed for wheezing.     albuterol (5 MG/ML) 0.5% nebulizer solution  Commonly known as:  PROVENTIL  Take 0.5 mLs (2.5 mg total) by nebulization every 6 (six) hours as needed for wheezing or shortness of breath.     atenolol 100 MG tablet  Commonly known as:  TENORMIN  Take 1 tablet (100 mg total) by mouth every morning.     baclofen 10 MG tablet  Commonly known as:  LIORESAL  Take 1 tablet (10 mg total) by mouth 3 (three) times daily.     benazepril 40 MG tablet  Commonly known as:  LOTENSIN  Take 40 mg by mouth daily.     buPROPion 150 MG 12 hr tablet  Commonly known as:  WELLBUTRIN SR  Take 1 tablet (150 mg total) by mouth 2 (two) times daily.     diphenhydrAMINE 25 MG tablet  Commonly known as:  BENADRYL  Take 25 mg by mouth every 6 (six) hours.     esomeprazole 20 MG capsule  Commonly known as:  NEXIUM  Take 1 capsule (20 mg total) by mouth daily before breakfast.     fluticasone 50 MCG/ACT nasal spray  Commonly known as:  FLONASE  Place 2 sprays into the nose 2 (two) times daily as needed for allergies.     Fluticasone-Salmeterol 250-50 MCG/DOSE Aepb  Commonly known as:  ADVAIR  DISKUS  Inhale 1 puff into the lungs 2 (two) times daily.     furosemide 40 MG tablet  Commonly known as:  LASIX  Take 40 mg by mouth 2 (two) times daily. Take 1 tablet in the morning and 1 in the afternoon     glucose blood test strip  Check blood sugar up to 4 times a day before meals and bedtime Dx code- 250.72, insulin requiring     glucose blood test strip  Commonly known as:  ONETOUCH VERIO  Check blood sugar up to 4 times a day before meals and bedtime Dx code- 250.72 insulin-requiring     insulin lispro protamine-lispro (75-25) 100 UNIT/ML Susp injection  Commonly known as:  HUMALOG 75/25  Inject 75-140 Units into the skin 2 (two) times daily with a meal. Inject 140 units in the morning and 75 units in the evening     ipratropium 0.02 % nebulizer solution  Commonly known as:  ATROVENT  Take 2.5 mLs (0.5 mg total) by nebulization every 6 (six) hours as needed for wheezing.     metFORMIN 1000 MG tablet  Commonly known as:  GLUCOPHAGE  Take 1 tablet (1,000 mg total) by mouth 2 (two) times daily.  ONETOUCH DELICA LANCETS FINE Misc  - 1 each by Does not apply route 2 (two) times daily. Check blood sugar up to 4 times a day before meals and bedtime Dx code- 250.72, insulin requiring  - From 03/03/2013     oxyCODONE-acetaminophen 5-325 MG per tablet  Commonly known as:  ROXICET  Take 2 tablets by mouth every 6 (six) hours as needed for pain.     pravastatin 40 MG tablet  Commonly known as:  PRAVACHOL  Take 1 tablet (40 mg total) by mouth every morning.     pregabalin 200 MG capsule  Commonly known as:  LYRICA  Take 200 mg by mouth 3 (three) times daily.     silver sulfADIAZINE 1 % cream  Commonly known as:  SILVADENE  Apply 1 application topically daily.     travoprost (benzalkonium) 0.004 % ophthalmic solution  Commonly known as:  TRAVATAN  1 drop at bedtime.     traZODone 100 MG tablet  Commonly known as:  DESYREL  Take 1 tablet (100 mg total) by mouth at  bedtime.        Disposition and follow-up:   Ms.Destynee L Chittenden was discharged from [facility] in Stable condition.  At the hospital follow up visit please address:  1.  Please follow up on chest pain if still present, possibly pt might benefit from a barium swallow, to r/o akalasia, as she also describes some dysphagia to solids and liquids( unclear if significant) as all workup in the hospital including EGD failed to explain cause of chest pain.  2. H. Pylori results from an EGD done- 09/20/2013.  3.  Compliance with her anti-DM regimen.    Discharge Instructions:     Discharge Orders   Future Appointments Provider Department Dept Phone   09/27/2013 8:30 AM Cecil Cranker Plyler, RD [facility] [phone]   09/27/2013 10:15 AM Burns Spain, MD [facility] [phone]   Future Orders Complete By Expires   Call MD for:  persistant nausea and vomiting  As directed    Diet - low sodium heart healthy  As directed    Discharge instructions  As directed    Comments:     Please take your Nexium- 40mg  once a day. Please follow up with your doctor in clinic as already scheduled.   Increase activity slowly  As directed       Consultations:  Gastroenterology Cardiology.  Procedures Performed:  Dg Chest 2 View  09/16/2013   CLINICAL DATA:  Chest pain. Hypertension.  EXAM: CHEST  2 VIEW  COMPARISON:  07/06/2013  FINDINGS: The heart size and mediastinal contours are within normal limits. Both lungs are clear. The visualized skeletal structures are unremarkable.  IMPRESSION: No active cardiopulmonary disease.   Electronically Signed   By: Amie Portland   On: 09/16/2013 18:14   Nm Myocar Multi W/spect W/wall Motion / Ef  09/19/2013   CLINICAL DATA:  Chest pain, tobacco use, diabetes, hypertension, elevated lipids and CHF.  EXAM: MYOCARDIAL IMAGING WITH SPECT (REST AND PHARMACOLOGIC-STRESS - 2 DAY PROTOCOL)  GATED LEFT  VENTRICULAR WALL MOTION STUDY  LEFT VENTRICULAR EJECTION FRACTION  TECHNIQUE: A 2 day protocol was utilized with the patient receiving 30 mCi of technetium 75 M sestamibi IV for both rest and stress examinations. Pharmacologic stress was performed with administration of Lexiscan.  COMPARISON:  None.  FINDINGS: Utilizing gated dated, the end-diastolic volume is estimated to be 102 mL and  the end systolic volume 36 mL. Calculated ejection fraction is 65%.  Gated wall motion analysis is unremarkable and shows no regional wall motion abnormalities.  SPECT imaging shows mild distal anterior wall attenuation and thinning which may be attributable to breast soft tissue attenuation. No significant reversible ischemia was identified with pharmacologic stress.  IMPRESSION: Normal left ventricular function with quantitative ejection fraction of 65%. No evidence of inducible myocardial ischemia with Lexiscan administration.   Electronically Signed   By: Irish Lack   On: 09/19/2013 16:50   Nm Pulmonary Perf And Vent  09/16/2013   *RADIOLOGY REPORT*  Clinical Data:  52 year old female with chest pain and shortness of breath.  History of DVT in past.  NUCLEAR MEDICINE VENTILATION - PERFUSION LUNG SCAN  Technique:  Ventilation images were obtained in multiple projections using inhaled aerosol technetium 99 M DTPA.  Perfusion images were obtained in multiple projections after intravenous injection of Tc-26m MAA.  Radiopharmaceuticals:  Tc-16m DTPA aerosol and 6.0 mCi Tc-44m MAA.  Comparison: 09/16/2013 and prior chest radiographs  Findings:  Ventilation:   No focal ventilation defect.  Perfusion:   No wedge shaped peripheral perfusion defects to suggest acute pulmonary embolism  IMPRESSION: Very low probability of pulmonary embolism (less than 10%).   Original Report Authenticated By: Harmon Pier, M.D.   2D Echo: LV EF of 65-70, LV- severe concentric hypertrophy, with normal cavity size. Mild atrial enlargement. No  Valvular abnormality.  Admission HPI: Chief Complaint: Chest pain  History of Present Illness: Patient was admitted by triad hospitalist for chest pain rule out, and was subsequently transferred to [facility]MT[facility].  19 y o F with PMH of COPD, Hyperlipidemia, HTN, Morbid obesity, DM 2 with peripheral circulatory disorders, Chronic diastolic CHF, GERD, infective endocarditis.  Patient presented with c/o of chest pain for one week, says its located in the upper part of her chest and radiates upward to her jaw and toward her Lt ear. Not sure if pain is related to exertion, last about 7-10 minutes, no assoc SOB or dizziness. Pt says this pain is different from the reflux symptoms she normally gets, for which she take s nexium- 20mg  daily. Pt has signif PMH- Hyperlipidemia, HTN, DM and chronic diastolic heart failure, has a 20 pack Year smoking hx. Pt takes aspirin- 81mg  daily.  No chest pain presently, but said she had chest pain previously and was given NTD sublingual, which might have helped but she does not remember. Patient also reports having several episodes of nausea, vomiting and diarrhea in the over the past few weeks.   Hospital Course by problem list: Principal Problem:   Chest pain Active Problems:   Type II diabetes mellitus with peripheral circulatory disorders, uncontrolled (250.72)   Hyperlipidemia   HYPERTENSION   COPD   Severe obesity (BMI >= 40)   Acute on chronic diastolic CHF (congestive heart failure), NYHA class 3   # Chest Pain- Patient had significant risk factors for CAD, includes- obesity, DM,hyperlipidemia, HTN, CHF, tobacco use. However, we have effectively ruled out ACS, Negative troponins X3, EKG without features suggestive of ischemia and a Myoview, negative for inducible ischemia. Had a VQ scan done which showed low probability for PE. (low prob V/Q scan). Pt had an EGD done in 2012, which showed esophageal stricture, requiring dilatation. Gastoenterology was consulted, and an  EGD was done, which showed gastritis, buts its unclear how clinically significant this is. Lipase done was normal, Pt was given GI coctails, sucralfate,and aspirin 81mg , without significant  symptom relief.The etiology of the patient's continued abdominal/chest pain remained unclear.  Pt was discharged home to continue once daily PPI . Awaiting H Pylori results from gastric biopsies 9/30. Having excluded any potential life-threatening causes of chest pain, further work-up will likely need to be completed in the outpatient setting. Consider a barium swallow or manometry to rule out akalasia or esophageal dysmotilty disorders.  # DCHF- No signs of fluid overload on admission. BNP- elevated but pt has had more elevated levels in the past - 09/16/2013- 256.9, 07/07/2013- 60.7, 01/26/2013- 727.3. Echo showed EF 65 -70%. Strict I/O while on admission. Patient home meds- Furosemide- 40mg  BID continued on admission and on discharge.  # COPD- Well controlled on Home meds- Albuterol and ipratropium inhalers, And Advair, Ipratropium and Albuterol Nebs prn. Meds continued on discharge.   # DM- HBA1c- 09/16/2013- 8. Home meds- Humalog- 140- Morning, 75 units at night, with Metformin- 1000 BID. Blood sugars- 178- 290. Patient is not compliant with her DM meds. Meds continued on discharge.  # HTN- chronic, stable . Continue home meds benazepril and atenolol.  # Hyperlipidemia- chronic, stable. Continue Pravastatin   # GERD- On admission was on Pantoprazole 40mg . Pt had nexium at home, so discharged home to cont at 40mg  dly.  Discharge Vitals:   BP 128/74  Pulse 78  Temp(Src) 98.1 F (36.7 C) (Oral)  Resp 16  Ht 5\' 7"  (1.702 m)  Wt 277 lb 14.4 oz (126.055 kg)  BMI 43.52 kg/m2  SpO2 95%  Discharge Labs:  Results for orders placed during the hospital encounter of 09/16/13 (from the past 24 hour(s))  LIPASE, BLOOD     Status: None   Collection Time    09/20/13  4:00 PM      Result Value Range   Lipase 14  11 -  59 U/L  GLUCOSE, CAPILLARY     Status: Abnormal   Collection Time    09/20/13  4:26 PM      Result Value Range   Glucose-Capillary 280 (*) 70 - 99 mg/dL  GLUCOSE, CAPILLARY     Status: Abnormal   Collection Time    09/20/13  9:24 PM      Result Value Range   Glucose-Capillary 228 (*) 70 - 99 mg/dL   Comment 1 Notify RN    GLUCOSE, CAPILLARY     Status: Abnormal   Collection Time    09/21/13  6:10 AM      Result Value Range   Glucose-Capillary 202 (*) 70 - 99 mg/dL   Comment 1 Notify RN    GLUCOSE, CAPILLARY     Status: Abnormal   Collection Time    09/21/13 11:49 AM      Result Value Range   Glucose-Capillary 205 (*) 70 - 99 mg/dL   Comment 1 Notify RN      Signed: Kennis Carina, MD 09/21/2013, 3:06 PM   Time Spent on Discharge: 40 minutes Services Ordered on Discharge: None. Equipment Ordered on Discharge: None.

## 2013-09-21 NOTE — Progress Notes (Signed)
Inpatient Diabetes Program Recommendations  AACE/ADA: New Consensus Statement on Inpatient Glycemic Control (2013)  Target Ranges:  Prepandial:   less than 140 mg/dL      Peak postprandial:   less than 180 mg/dL (1-2 hours)      Critically ill patients:  140 - 180 mg/dL   Hyperglycemia sustained.  Inpatient Diabetes Program Recommendations Insulin - Basal: If not order lantus, please inrease pt's 70/30 dose.  Pt did well on the previous 36 units bid prior to procedure. Oral Agents: Please d/c metformin while in the hospital. Thank you, Lenor Coffin, RN, CNS, Diabetes Coordinator [phone])

## 2013-09-21 NOTE — Progress Notes (Signed)
Subjective: Still complaints of chest pain radiating to her throat. She says she has had some difficulty swallowing solids and liquids. But appetite is normal.   Objective: Vital signs in last 24 hours: Filed Vitals:   09/21/13 0612 09/21/13 0920 09/21/13 1000 09/21/13 1042  BP: 150/64 110/83  123/47  Pulse: 77 87    Temp: 98.1 F (36.7 C) 98.5 F (36.9 C)    TempSrc: Oral Oral    Resp: 17  14   Height:      Weight: 277 lb 14.4 oz (126.055 kg)     SpO2: 96% 98%     Weight change: -3 lb 1.6 oz (-1.406 kg)  Intake/Output Summary (Last 24 hours) at 09/21/13 1345 Last data filed at 09/21/13 1316  Gross per 24 hour  Intake    963 ml  Output   2050 ml  Net  -1087 ml   Physical Exam-  GENERAL- alert, co-operative, appears as stated age, not in any distress. HEENT- Atraumatic, normocephalic, PERRL, EOMI, oral mucosa appears moist.  CARDIAC- RRR, no murmurs, rubs or gallops. RESP- Moving equal volumes of air, and clear to auscultation bilaterally. ABDOMEN- Soft,non tender, no palpable masses or organomegaly, bowel sounds present. EXTREMITIES- pulse 2+, symmetric, dressing on both lower extremities, has had amputation of 2 last toes on the left.Marland Kitchen SKIN- Warm, dry, No rash or lesion.  Lab Results: Basic Metabolic Panel:  Recent Labs Lab 09/16/13 1354  09/18/13 0535 09/19/13 0510 09/20/13 0655  NA 146*  < > 138 138 138  K 3.7  < > 3.9 4.2 4.0  CL 104  < > 99 99 97  CO2 31  < > 28 25 29   GLUCOSE 131*  < > 237* 262* 234*  BUN 10  < > 20 23 18   CREATININE 0.77  < > 0.82 0.93 0.85  CALCIUM 9.0  < > 8.9 8.9 8.9  MG 1.3*  --  1.7  --   --   < > = values in this interval not displayed. Liver Function Tests:  Recent Labs Lab 09/20/13 0655  AST 41*  ALT 31  ALKPHOS 77  BILITOT 0.2*  PROT 6.8  ALBUMIN 3.1*    Recent Labs Lab 09/20/13 1600  LIPASE 14   CBC:  Recent Labs Lab 09/16/13 1706  WBC 10.9*  HGB 12.5  HCT 38.3  MCV 93.4  PLT 279   Cardiac  Enzymes:  Recent Labs Lab 09/17/13 0250 09/17/13 1050  TROPONINI <0.30 <0.30   BNP:  Recent Labs Lab 09/16/13 1706  PROBNP 256.9*   D-Dimer:  Recent Labs Lab 09/16/13 1706  DDIMER 1.38*   CBG:  Recent Labs Lab 09/20/13 0812 09/20/13 1232 09/20/13 1626 09/20/13 2124 09/21/13 0610 09/21/13 1149  GLUCAP 252* 238* 280* 228* 202* 205*   Hemoglobin A1C:  Recent Labs Lab 09/16/13 1406  HGBA1C 8.0   Urine Drug Screen: Drugs of Abuse     Component Value Date/Time   LABOPIA NONE DETECTED 09/17/2013 0521   LABOPIA PPS 08/02/2013 1015   COCAINSCRNUR NONE DETECTED 09/17/2013 0521   COCAINSCRNUR NEG 08/02/2013 1015   LABBENZ NONE DETECTED 09/17/2013 0521   LABBENZ NEG 08/02/2013 1015   LABBENZ NEG 06/28/2010 2130   AMPHETMU NONE DETECTED 09/17/2013 0521   AMPHETMU NEG 06/28/2010 2130   THCU NONE DETECTED 09/17/2013 0521   LABBARB NONE DETECTED 09/17/2013 0521   LABBARB NEG 08/02/2013 1015    Micro Results: Recent Results (from the past 240 hour(s))  CLOSTRIDIUM DIFFICILE  BY PCR     Status: None   Collection Time    09/20/13  2:26 PM      Result Value Range Status   C difficile by pcr NEGATIVE  NEGATIVE Final   Studies/Results: No results found. Medications: I have reviewed the patient's current medications. Scheduled Meds: . amLODipine  2.5 mg Oral Daily  . aspirin EC  81 mg Oral Daily  . atenolol  100 mg Oral q morning - 10a  . atorvastatin  40 mg Oral q1800  . benazepril  40 mg Oral Daily  . buPROPion  150 mg Oral BID  . enoxaparin (LOVENOX) injection  65 mg Subcutaneous Q24H  . furosemide  40 mg Oral BID  . insulin aspart  0-15 Units Subcutaneous TID WC  . insulin aspart protamine- aspart  15 Units Subcutaneous BID WC  . metFORMIN  1,000 mg Oral BID WC  . pantoprazole  40 mg Oral Daily  . pregabalin  200 mg Oral TID  . silver sulfADIAZINE   Topical Daily  . sodium chloride  3 mL Intravenous Q12H  . traZODone  100 mg Oral QHS   Continuous Infusions: .  sodium chloride 500 mL (09/20/13 1022)   PRN Meds:.sodium chloride, albuterol, diphenhydrAMINE, gi cocktail, ondansetron (ZOFRAN) IV, ondansetron, oxyCODONE-acetaminophen, sodium chloride Assessment/Plan:  # Chest Pain- The etiology of the patient's continued abdominal/chest pain is unclear. However, we have effectively ruled out ACS (normal myoview), PE (low prob V/Q scan), esophageal stricture (EGD unremarkable), and compression fracture (CXR unremarkable). The only explanation we have at this point is the gastritis seen on EGD, though it's unclear how clinically significant this is. Also consider akalasia. Will discharge pt home today to follow up in clinic for possible akalasia, as patient is clinically stable. Lipase- Normal.  - GI cocktail prn - Aspirin- 81 mg.  - Will discharge pt home on Nexium 40mg  BID -check lipase today  -await H Pylori results from gastric biopsies 9/30  -will add sucralfate for possible symptomatic relief of gastritis, though I don't know how beneficial this will be  -as we have excluded any potential life-threatening causes of chest pain, further work-up will likely need to be completed in the outpatient setting.  # DCHF- Echo shows EF 65 -70%. Patient home meds- Furosemide- 40mg  BID. BNP- elevated but pt has had more elevated levels in the past - 09/16/2013- 256.9, 07/07/2013- 60.7, 01/26/2013- 727.3.  - Strict I/O's  # COPD- Home meds- Albuterol and ipratropium inhalers, And Advair.  - Ipratropium and Albuterol Nebs prn. # DM- HBA1c- 09/16/2013- 8. Home meds- Humalog- 140- Morning, 75 units at night, with Metformin- 1000 BID. Blood sugars- 178- 290. Patient is not compliant with her DM meds.  - Cont Metformin 1000mg  BID and Novolog-70/30- 25units.  -SSI- M  # Loose stools - appear to be self-resolving, though still with 2 loose stools yesterday, and at least 1 episode so far today  -C. diff collected today- Negative.  # HTN- chronic, stable  -continue benazepril,  atenolol  # Hyperlipidemia- chronic, stable  -continue Pravastatin  # GERD-  -Pantoprazole 40mg  BID # # DVT ppx- Lovenox.   # CODE- FULL.    Dispo: Disposition is deferred at this time, awaiting improvement of current medical problems.  Anticipated discharge in approximately today.   The patient does have a current PCP Burns Spain, MD) and does need an [facility] hospital follow-up appointment after discharge.  The patient does not know have transportation limitations that hinder transportation to  clinic appointments.  .Services Needed at time of discharge: Y = Yes, Blank = No PT:   OT:   RN:   Equipment:   Other:     LOS: 5 days   Kennis Carina, MD 09/21/2013, 1:45 PM

## 2013-09-21 NOTE — Progress Notes (Signed)
     Jennifer [name] Daily Rounding Note 09/21/2013, 9:28 AM  SUBJECTIVE:       Still with pain in mid to upper sternum/chest radiating to left jaw and ear.  No triggers, it is just persistenly present.  No nausea.  No odynophagia.  No SOB or cough  OBJECTIVE:         Vital signs in last 24 hours:    Temp:  [97.4 F (36.3 C)-98.1 F (36.7 C)] 98.1 F (36.7 C) (10/01 0612) Pulse Rate:  [70-77] 77 (10/01 0612) Resp:  [13-19] 17 (10/01 0612) BP: (132-196)/(64-139) 150/64 mmHg (10/01 0612) SpO2:  [91 %-99 %] 96 % (10/01 0612) Weight:  [126.055 kg (277 lb 14.4 oz)] 126.055 kg (277 lb 14.4 oz) (10/01 0612) Last BM Date: 09/19/13 General:  Chronically unwell, morbidly obese.  uncomfortable   Heart: RRR Chest: tenderness elicited by palpation of sternum.  BS clear Abdomen: soft, obese, active BS.  No   Extremities: Hard to discern if obesity vs non-pitting swelling of lower legs Neuro/Psych:  No confusion or tremor.  + anxiety.   Intake/Output from previous day: 09/30 0701 - 10/01 0700 In: 480 [P.O.:480] Out: 2800 [Urine:2800]  Intake/Output this shift:    Lab Results: No results found for this basename: WBC, HGB, HCT, PLT,  in the last 72 hours BMET  Recent Labs  09/19/13 0510 09/20/13 0655  NA 138 138  K 4.2 4.0  CL 99 97  CO2 25 29  GLUCOSE 262* 234*  BUN 23 18  CREATININE 0.93 0.85  CALCIUM 8.9 8.9   LFT  Recent Labs  09/20/13 0655  PROT 6.8  ALBUMIN 3.1*  AST 41*  ALT 31  ALKPHOS 77  BILITOT 0.2*    Studies/Results: Nm Myocar Multi W/spect W/wall Motion / Ef 09/19/2013     IMPRESSION: Normal left ventricular function with quantitative ejection fraction of 65%. No evidence of inducible myocardial ischemia with Lexiscan administration.   Electronically Signed   By: Irish Lack   On: 09/19/2013 16:50    ASSESMENT: * Atypical substernal chest pain. Does not sound cardiac though pt has extensive rish factors for CAD. nuc stress test in progress  Hx  esophageal stricture dilated 2010. Occasional dysphagia to solids/liquids.  9/29 EGD with nodular gastritis, awaiting pathology.   Needs to continue once daily PPI.  Was on Nexium 20 mg, this ought to be upped to 40 mg daily.  Today the exam suggests musculoskeletal pain.  * Diarrhea. C diff negative 9/30.  * IDDM  * Recent amputations left toes 08/31/2013 . ASPVD. Still smokes.  * Hx HP colono polyps and diverticulosis on 12/2010 colonoscopy.  * Polypharmacy    PLAN: *  Daily PPI *  Stop Carafate.  Stop enteric precautions *  Will sign off.  Will follow up pathology    LOS: 5 days   Jennifer [name]  09/21/2013, 9:28 AM Pager: [phone]

## 2013-09-21 NOTE — Progress Notes (Signed)
I agree with the above documentation, including the assessment and plan. Ongoing, persistent cp with radiation to the jaw is not felt to be GI in nature, given EGD findings yesterday I will follow-up pathology and notify patient if positive for H. Pylori as she would need treatment Call with questions

## 2013-09-21 NOTE — Progress Notes (Signed)
ANTICOAGULATION CONSULT NOTE - Follow Up Consult  Pharmacy Consult for Lovenox Indication: VTE prophylaxis  Allergies  Allergen Reactions  . Iohexol      Desc: IV CONTRAST CAUSE NEPHROPATHY IN 2007   . Ivp Dye [Iodinated Diagnostic Agents]   . Morphine Sulfate Itching and Rash    Patient Measurements: Height: 5\' 7"  (170.2 cm) Weight: 277 lb 14.4 oz (126.055 kg) (Scale C) IBW/kg (Calculated) : 61.6 LMWH Dosing Weight: 126.1 kg  Vital Signs: Temp: 98.5 F (36.9 C) (10/01 0920) Temp src: Oral (10/01 0920) BP: 110/83 mmHg (10/01 0920) Pulse Rate: 87 (10/01 0920)  Labs:  Recent Labs  09/19/13 0510 09/20/13 0655  CREATININE 0.93 0.85    Estimated Creatinine Clearance: 104.4 ml/min (by C-G formula based on Cr of 0.85).   Assessment: Osteomyelitis ulceration left foot fifth metatarsal head. Admitted 09/16/2013 for 5th ray amputation  Anticoagulation: Enoxaparin for VTE prophylaxis in obesity. CBC WNL. CrCl>100.  0.5 mg x 129.8 KG= 65 mg dose of Lovenox daily. Hx DVT of upper extremity related to PICC line - on Coumadin 03/10/13 thru 06/10/13.  Infectious Disease: chronic osteo of foot; s/p ray amputation. No abx.  Cardiovascular: HLD, PVD, c/o chest pain; s/p 2 Day myoview study - no evidence of ischemia EF 65%; hx CHF still diuresis for edema. VSS Meds: ASA 81mg , amlodipine, atenolol, atorvastatin, benazepril, Lasix. Her CP is likely caused by GI issues.  Endocrinology: Hx DM- non-compliant with diabetic diet in hospital, consuming outside sugary drinks, on metformin, 70/30 and SSI. CBGs all>202.  Gastrointestinal / Nutrition: hx GERD, PPI PO, chest pain caused by GI issues? LFTs wnl. Hx esophageal stricture dilated 2010. Occasional dysphagia to solids/liquids. 9/29 EGD with nodular gastritis, awaiting pathology.   Neurology: hx depression, diabetic neuropathy- on pregabalin, buproprion  Nephrology: Scr 0.85, CrCl > 100 ml/min  Pulmonary: hx asthma, COPD.  +tobacco.  Hematology / Oncology: CBC OK  PTA Medication Issues: resumed appropriately  Best Practices: enox, po ptx  Goal of Therapy:  Anti-Xa level 0.6-1.2 units/ml 4hrs after LMWH dose given Monitor platelets by anticoagulation protocol: Yes   Plan:  Continue lovenox 65 mg sq Q 24 hrs  [place], [employer] 09/21/2013,10:14 AM

## 2013-09-21 NOTE — Progress Notes (Signed)
  RD consulted for nutrition education regarding diabetes.   Lab Results  Component Value Date   HGBA1C 8.0 09/16/2013    RD provided "Carbohydrate Counting for People with Diabetes" handout from the Academy of Nutrition and Dietetics as well as "1800 Calorie 5-Day Menus" which provides carb balanced meals. Discussed different food groups and their effects on blood sugar, emphasizing carbohydrate-containing foods. Provided list of carbohydrates and recommended serving sizes of common foods. Pt reports drinking regular soda and refuses to drink diet. Discussed carb content of soda and encouraged pt to limit intake to 8 ounces per day. Pt agreeable to drinking more water.   Discussed importance of controlled and consistent carbohydrate intake throughout the day. Provided examples of ways to balance meals/snacks and encouraged intake of high-fiber, whole grain complex carbohydrates. Teach back method used.  Expect fair compliance.  Body mass index is 43.52 kg/(m^2). Pt meets criteria for Morbid Obesity based on current BMI.  Current diet order is Carb Modified, patient is consuming approximately 50-100% of meals at this time. Labs and medications reviewed. No further nutrition interventions warranted at this time. RD contact information provided. If additional nutrition issues arise, please re-consult RD.  Ian Malkin RD, LDN Inpatient Clinical Dietitian Pager: [phone] After Hours Pager: [phone]

## 2013-09-22 ENCOUNTER — Other Ambulatory Visit: Payer: Self-pay | Admitting: Internal Medicine

## 2013-09-22 ENCOUNTER — Encounter: Payer: Self-pay | Admitting: Internal Medicine

## 2013-09-22 NOTE — Telephone Encounter (Signed)
This med was stopped in May and lasix was started.

## 2013-09-23 ENCOUNTER — Telehealth: Payer: Self-pay | Admitting: *Deleted

## 2013-09-23 NOTE — Telephone Encounter (Signed)
Pt called upset - has gained 6-8 lbs in one day. BP was 140/70 and states CBG was ok. Has problems with transportation. Unable to come Monday - has appt with Dr Lajoyce Corners and appt 09/27/13 with Dr Rogelia Boga. Has 2 fliud pills a home - one is Lasix and the other she only does for 3 days. Stanton Kidney Wylodean Shimmel RN 09/23/13 3:40PM

## 2013-09-23 NOTE — Telephone Encounter (Signed)
Talked with Dr Dalphine Handing about pt - since no SOB and pt is doing Lasix as directed - observe. If any change - suggest to go to ER. Pt is very upset and hung up. Pt did not want to go  to ER now due to wt gain.Pt was upset because we told her to call and we do nothing. Stanton Kidney Axle Parfait RN 09/23/13 4:30PM

## 2013-09-24 ENCOUNTER — Telehealth: Payer: Self-pay | Admitting: Internal Medicine

## 2013-09-24 ENCOUNTER — Other Ambulatory Visit: Payer: Self-pay | Admitting: Internal Medicine

## 2013-09-24 NOTE — Telephone Encounter (Signed)
   Reason for call:   I received a call from [name]  indicating That she continues to gain weight since hospital discharge.   Pertinent Data:   Discharge weight 277 per EPIC notes. Pt reports weight 286 lbs today   Assessment / Plan / Recommendations:   9 lb weight gain since hospital discharge on 09/21/2013.  Managed with lasix 40 mg po bid. Denies increased sob or chest pain.. Advised to increase Lasix to 80 mg po bid for next day then check weight.  Prefers to keep appt with PCP 09/27/2013 as oppossed to ED evaluation  As always, pt is advised that if symptoms worsen or new symptoms arise, they should go to an urgent care facility or to to ER for further evaluation.   Manuela Schwartz, MD   09/24/2013, 12:36 PM

## 2013-09-26 NOTE — Telephone Encounter (Signed)
Talked with pt - has appt with Dr Rogelia Boga 09/27/13. Pt has taken Zaroxlyn 09/23/2013, 10/4 and 09/25/2013. Hands and legs swollen. She states wt is up 17lb and then states down 268 lb. Saw Dr Lajoyce Corners today and sch for foot amputation 09/28/13. Will keep appt in clinic.

## 2013-09-26 NOTE — Telephone Encounter (Signed)
I just got message. I will be in epi course all day. Pls call her and enquire about her weight today. If it is above baseline, please ask her to take zaroxlyn for 3 days as she has done before.

## 2013-09-26 NOTE — Telephone Encounter (Signed)
Has appt in clinic 09/27/13 to see Dr Rogelia Boga - states wt is up 17 lbs and then states wt is down to 268 lbs. Has taken Zaroxlyn for 3 days 09/23/13 to 09/25/13. Dr Lajoyce Corners has sch foot amputation 09/28/13. Stanton Kidney Larrell Rapozo RN 09/26/13 2:15PM

## 2013-09-27 ENCOUNTER — Other Ambulatory Visit (HOSPITAL_COMMUNITY): Payer: Self-pay | Admitting: Orthopedic Surgery

## 2013-09-27 ENCOUNTER — Ambulatory Visit: Payer: Medicare Other | Admitting: Internal Medicine

## 2013-09-27 ENCOUNTER — Ambulatory Visit: Payer: Medicare Other | Admitting: Dietician

## 2013-09-27 ENCOUNTER — Encounter (HOSPITAL_COMMUNITY): Payer: Self-pay | Admitting: *Deleted

## 2013-09-27 MED ORDER — DEXTROSE 5 % IV SOLN: 3.0000 g | Freq: Once | INTRAVENOUS | Status: DC

## 2013-09-27 MED ORDER — CEFAZOLIN SODIUM 10 G IJ SOLR: 3.0000 g | INTRAMUSCULAR | Status: DC

## 2013-09-27 MED ORDER — DEXTROSE 5 % IV SOLN: 3.0000 g | INTRAVENOUS | Status: DC

## 2013-09-27 MED ORDER — DEXTROSE 5 % IV SOLN
3.0000 g | INTRAVENOUS | Status: AC
  Administered 2013-09-28: 3 g via INTRAVENOUS
  Filled 2013-09-27 (×2): qty 3000

## 2013-09-27 NOTE — Progress Notes (Signed)
Pt was inpatientt 9/26- 10/1 with c/o chest pains>  Pt states that is 3 of 10 now, "comes and goes."  Pt has Echo, Stress test, VQ scan, all within normal range. I informal Dr Elie Goody of pain and test results.  No new orders were given.

## 2013-09-28 ENCOUNTER — Observation Stay (HOSPITAL_COMMUNITY)
Admission: RE | Admit: 2013-09-28 | Discharge: 2013-09-29 | Disposition: A | Discharge status: Home / Self Care | Payer: Medicare Other | Source: Ambulatory Visit | Attending: Orthopedic Surgery | Admitting: Orthopedic Surgery

## 2013-09-28 ENCOUNTER — Encounter (HOSPITAL_COMMUNITY): Payer: Medicare Other | Admitting: Anesthesiology

## 2013-09-28 ENCOUNTER — Encounter (HOSPITAL_COMMUNITY): Payer: Self-pay | Admitting: *Deleted

## 2013-09-28 ENCOUNTER — Telehealth: Payer: Self-pay | Admitting: Dietician

## 2013-09-28 ENCOUNTER — Ambulatory Visit (HOSPITAL_COMMUNITY): Payer: Medicare Other | Admitting: Anesthesiology

## 2013-09-28 ENCOUNTER — Encounter (HOSPITAL_COMMUNITY)
Admission: RE | Disposition: A | Discharge status: Home / Self Care | Payer: Self-pay | Source: Ambulatory Visit | Attending: Orthopedic Surgery

## 2013-09-28 DIAGNOSIS — E1149 Type 2 diabetes mellitus with other diabetic neurological complication: Secondary | ICD-10-CM | POA: Insufficient documentation

## 2013-09-28 DIAGNOSIS — M86679 Other chronic osteomyelitis, unspecified ankle and foot: Secondary | ICD-10-CM | POA: Insufficient documentation

## 2013-09-28 DIAGNOSIS — M86672 Other chronic osteomyelitis, left ankle and foot: Secondary | ICD-10-CM

## 2013-09-28 DIAGNOSIS — J4489 Other specified chronic obstructive pulmonary disease: Secondary | ICD-10-CM | POA: Insufficient documentation

## 2013-09-28 DIAGNOSIS — I798 Other disorders of arteries, arterioles and capillaries in diseases classified elsewhere: Secondary | ICD-10-CM | POA: Insufficient documentation

## 2013-09-28 DIAGNOSIS — Z79899 Other long term (current) drug therapy: Secondary | ICD-10-CM | POA: Insufficient documentation

## 2013-09-28 DIAGNOSIS — I10 Essential (primary) hypertension: Secondary | ICD-10-CM | POA: Insufficient documentation

## 2013-09-28 DIAGNOSIS — E1169 Type 2 diabetes mellitus with other specified complication: Principal | ICD-10-CM | POA: Insufficient documentation

## 2013-09-28 DIAGNOSIS — I509 Heart failure, unspecified: Secondary | ICD-10-CM | POA: Insufficient documentation

## 2013-09-28 DIAGNOSIS — E1142 Type 2 diabetes mellitus with diabetic polyneuropathy: Secondary | ICD-10-CM | POA: Insufficient documentation

## 2013-09-28 DIAGNOSIS — E1159 Type 2 diabetes mellitus with other circulatory complications: Secondary | ICD-10-CM | POA: Insufficient documentation

## 2013-09-28 DIAGNOSIS — J449 Chronic obstructive pulmonary disease, unspecified: Secondary | ICD-10-CM | POA: Insufficient documentation

## 2013-09-28 DIAGNOSIS — L02619 Cutaneous abscess of unspecified foot: Secondary | ICD-10-CM | POA: Insufficient documentation

## 2013-09-28 DIAGNOSIS — I5032 Chronic diastolic (congestive) heart failure: Secondary | ICD-10-CM | POA: Insufficient documentation

## 2013-09-28 DIAGNOSIS — L03119 Cellulitis of unspecified part of limb: Secondary | ICD-10-CM | POA: Insufficient documentation

## 2013-09-28 DIAGNOSIS — M9080 Osteopathy in diseases classified elsewhere, unspecified site: Secondary | ICD-10-CM | POA: Insufficient documentation

## 2013-09-28 HISTORY — DX: Rheumatoid arthritis, unspecified: M06.9

## 2013-09-28 HISTORY — DX: Shortness of breath: R06.02

## 2013-09-28 HISTORY — PX: AMPUTATION: SHX166

## 2013-09-28 HISTORY — DX: Other chronic pain: G89.29

## 2013-09-28 HISTORY — DX: Low back pain: M54.5

## 2013-09-28 HISTORY — DX: Angina pectoris, unspecified: I20.9

## 2013-09-28 HISTORY — DX: Other chronic pain: M54.50

## 2013-09-28 HISTORY — PX: FOOT AMPUTATION THROUGH METATARSAL: SHX644

## 2013-09-28 HISTORY — DX: Unspecified chronic bronchitis: J42

## 2013-09-28 HISTORY — DX: Spondylosis without myelopathy or radiculopathy, lumbar region: M47.816

## 2013-09-28 LAB — COMPREHENSIVE METABOLIC PANEL
ALT: 38 U/L — ABNORMAL HIGH (ref 0–35)
AST: 43 U/L — ABNORMAL HIGH (ref 0–37)
Albumin: 3.5 g/dL (ref 3.5–5.2)
Alkaline Phosphatase: 89 U/L (ref 39–117)
BUN: 49 mg/dL — ABNORMAL HIGH (ref 6–23)
CO2: 28 mEq/L (ref 19–32)
Calcium: 9.2 mg/dL (ref 8.4–10.5)
Chloride: 95 mEq/L — ABNORMAL LOW (ref 96–112)
Creatinine, Ser: 1.56 mg/dL — ABNORMAL HIGH (ref 0.50–1.10)
GFR calc Af Amer: 42 mL/min — ABNORMAL LOW (ref >90)
GFR calc non Af Amer: 37 mL/min — ABNORMAL LOW (ref >90)
Glucose, Bld: 200 mg/dL — ABNORMAL HIGH (ref 70–99)
Potassium: 3.5 mEq/L (ref 3.5–5.1)
Sodium: 137 mEq/L (ref 135–145)
Total Bilirubin: 0.2 mg/dL — ABNORMAL LOW (ref 0.3–1.2)
Total Protein: 7.7 g/dL (ref 6.0–8.3)

## 2013-09-28 LAB — CBC
HCT: 39.6 % (ref 36.0–46.0)
Hemoglobin: 13.3 g/dL (ref 12.0–15.0)
MCH: 30.5 pg (ref 26.0–34.0)
MCHC: 33.6 g/dL (ref 30.0–36.0)
MCV: 90.8 fL (ref 78.0–100.0)
Platelets: 198 10*3/uL (ref 150–400)
RBC: 4.36 MIL/uL (ref 3.87–5.11)
RDW: 15.1 % (ref 11.5–15.5)
WBC: 9.5 10*3/uL (ref 4.0–10.5)

## 2013-09-28 LAB — GLUCOSE, CAPILLARY
Glucose-Capillary: 182 mg/dL — ABNORMAL HIGH (ref 70–99)
Glucose-Capillary: 201 mg/dL — ABNORMAL HIGH (ref 70–99)
Glucose-Capillary: 300 mg/dL — ABNORMAL HIGH (ref 70–99)

## 2013-09-28 LAB — PROTIME-INR
INR: 1.1 (ref 0.00–1.49)
Prothrombin Time: 14 seconds (ref 11.6–15.2)

## 2013-09-28 LAB — APTT: aPTT: 32 seconds (ref 24–37)

## 2013-09-28 SURGERY — AMPUTATION, FOOT, PARTIAL
Anesthesia: General | Site: Foot | Laterality: Left | Wound class: Contaminated

## 2013-09-28 MED ORDER — LATANOPROST 0.005 % OP SOLN
1.0000 [drp] | Freq: Every day | OPHTHALMIC | Status: DC
  Administered 2013-09-29: 1 [drp] via OPHTHALMIC
  Filled 2013-09-28: qty 2.5

## 2013-09-28 MED ORDER — TRAZODONE HCL 100 MG PO TABS
100.0000 mg | ORAL_TABLET | Freq: Every day | ORAL | Status: DC
  Administered 2013-09-29: 100 mg via ORAL
  Filled 2013-09-28 (×2): qty 1

## 2013-09-28 MED ORDER — ONDANSETRON HCL 4 MG/2ML IJ SOLN
INTRAMUSCULAR | Status: DC | PRN
  Administered 2013-09-28: 4 mg via INTRAMUSCULAR

## 2013-09-28 MED ORDER — ALBUTEROL SULFATE (5 MG/ML) 0.5% IN NEBU: 2.5000 mg | INHALATION_SOLUTION | Freq: Four times a day (QID) | RESPIRATORY_TRACT | Status: DC | PRN

## 2013-09-28 MED ORDER — METOCLOPRAMIDE HCL 5 MG/ML IJ SOLN
INTRAMUSCULAR | Status: AC
  Administered 2013-09-28: 10 mg via INTRAVENOUS
  Filled 2013-09-28: qty 2

## 2013-09-28 MED ORDER — ONDANSETRON HCL 4 MG/2ML IJ SOLN: 4.0000 mg | Freq: Once | INTRAMUSCULAR | Status: DC | PRN

## 2013-09-28 MED ORDER — LACTATED RINGERS IV SOLN
INTRAVENOUS | Status: DC
  Administered 2013-09-28: 10:00:00 via INTRAVENOUS

## 2013-09-28 MED ORDER — INSULIN ASPART 100 UNIT/ML ~~LOC~~ SOLN
6.0000 [IU] | Freq: Three times a day (TID) | SUBCUTANEOUS | Status: DC
  Administered 2013-09-28 – 2013-09-29 (×3): 6 [IU] via SUBCUTANEOUS

## 2013-09-28 MED ORDER — INSULIN ASPART 100 UNIT/ML ~~LOC~~ SOLN
0.0000 [IU] | Freq: Three times a day (TID) | SUBCUTANEOUS | Status: DC
  Administered 2013-09-28: 7 [IU] via SUBCUTANEOUS
  Administered 2013-09-29: 15 [IU] via SUBCUTANEOUS
  Administered 2013-09-29: 20 [IU] via SUBCUTANEOUS

## 2013-09-28 MED ORDER — FUROSEMIDE 40 MG PO TABS
40.0000 mg | ORAL_TABLET | Freq: Two times a day (BID) | ORAL | Status: DC
  Administered 2013-09-28 – 2013-09-29 (×2): 40 mg via ORAL
  Filled 2013-09-28 (×4): qty 1

## 2013-09-28 MED ORDER — BUPIVACAINE HCL (PF) 0.25 % IJ SOLN
INTRAMUSCULAR | Status: AC
  Filled 2013-09-28: qty 30

## 2013-09-28 MED ORDER — TRAVOPROST 0.004 % OP SOLN: 1.0000 [drp] | Freq: Every day | OPHTHALMIC | Status: DC

## 2013-09-28 MED ORDER — BACLOFEN 10 MG PO TABS
10.0000 mg | ORAL_TABLET | Freq: Three times a day (TID) | ORAL | Status: DC
  Administered 2013-09-28 – 2013-09-29 (×3): 10 mg via ORAL
  Filled 2013-09-28 (×4): qty 1

## 2013-09-28 MED ORDER — ONDANSETRON HCL 4 MG/2ML IJ SOLN
4.0000 mg | Freq: Four times a day (QID) | INTRAMUSCULAR | Status: DC | PRN
  Administered 2013-09-28: 4 mg via INTRAVENOUS
  Filled 2013-09-28: qty 2

## 2013-09-28 MED ORDER — METOCLOPRAMIDE HCL 5 MG PO TABS: 5.0000 mg | ORAL_TABLET | Freq: Three times a day (TID) | ORAL | Status: DC | PRN

## 2013-09-28 MED ORDER — CEFAZOLIN SODIUM-DEXTROSE 2-3 GM-% IV SOLR
2.0000 g | Freq: Four times a day (QID) | INTRAVENOUS | Status: AC
  Administered 2013-09-28 – 2013-09-29 (×3): 2 g via INTRAVENOUS
  Filled 2013-09-28 (×3): qty 50

## 2013-09-28 MED ORDER — OXYCODONE-ACETAMINOPHEN 5-325 MG PO TABS
1.0000 | ORAL_TABLET | ORAL | Status: DC | PRN
  Administered 2013-09-28 – 2013-09-29 (×3): 2 via ORAL
  Filled 2013-09-28: qty 2

## 2013-09-28 MED ORDER — ONDANSETRON HCL 4 MG PO TABS: 4.0000 mg | ORAL_TABLET | Freq: Four times a day (QID) | ORAL | Status: DC | PRN

## 2013-09-28 MED ORDER — HYDROMORPHONE HCL PF 1 MG/ML IJ SOLN
INTRAMUSCULAR | Status: AC
  Administered 2013-09-28: 0.5 mg via INTRAVENOUS
  Filled 2013-09-28: qty 1

## 2013-09-28 MED ORDER — FENTANYL CITRATE 0.05 MG/ML IJ SOLN
INTRAMUSCULAR | Status: DC | PRN
  Administered 2013-09-28: 50 ug via INTRAVENOUS

## 2013-09-28 MED ORDER — MOMETASONE FURO-FORMOTEROL FUM 100-5 MCG/ACT IN AERO
2.0000 | INHALATION_SPRAY | Freq: Two times a day (BID) | RESPIRATORY_TRACT | Status: DC
  Administered 2013-09-28 – 2013-09-29 (×2): 2 via RESPIRATORY_TRACT
  Filled 2013-09-28: qty 8.8

## 2013-09-28 MED ORDER — PREGABALIN 75 MG PO CAPS
200.0000 mg | ORAL_CAPSULE | Freq: Three times a day (TID) | ORAL | Status: DC
  Administered 2013-09-28 – 2013-09-29 (×3): 200 mg via ORAL
  Filled 2013-09-28 (×3): qty 2
  Filled 2013-09-28 (×3): qty 1

## 2013-09-28 MED ORDER — BENAZEPRIL HCL 40 MG PO TABS
40.0000 mg | ORAL_TABLET | Freq: Every day | ORAL | Status: DC
  Administered 2013-09-29: 40 mg via ORAL
  Filled 2013-09-28: qty 1

## 2013-09-28 MED ORDER — SIMVASTATIN 5 MG PO TABS
5.0000 mg | ORAL_TABLET | Freq: Every day | ORAL | Status: DC
  Administered 2013-09-28 – 2013-09-29 (×2): 5 mg via ORAL
  Filled 2013-09-28 (×3): qty 1

## 2013-09-28 MED ORDER — PANTOPRAZOLE SODIUM 40 MG PO TBEC
40.0000 mg | DELAYED_RELEASE_TABLET | Freq: Every day | ORAL | Status: DC
  Administered 2013-09-29: 40 mg via ORAL
  Filled 2013-09-28: qty 1

## 2013-09-28 MED ORDER — BUPROPION HCL ER (SR) 150 MG PO TB12
150.0000 mg | ORAL_TABLET | Freq: Two times a day (BID) | ORAL | Status: DC
  Administered 2013-09-29 (×2): 150 mg via ORAL
  Filled 2013-09-28 (×3): qty 1

## 2013-09-28 MED ORDER — METFORMIN HCL 500 MG PO TABS: 1000.0000 mg | ORAL_TABLET | Freq: Two times a day (BID) | ORAL | Status: DC

## 2013-09-28 MED ORDER — PROPOFOL 10 MG/ML IV BOLUS
INTRAVENOUS | Status: DC | PRN
  Administered 2013-09-28: 200 mg via INTRAVENOUS

## 2013-09-28 MED ORDER — LACTATED RINGERS IV SOLN
INTRAVENOUS | Status: DC | PRN
  Administered 2013-09-28: 11:00:00 via INTRAVENOUS

## 2013-09-28 MED ORDER — HYDROMORPHONE HCL PF 1 MG/ML IJ SOLN
0.2500 mg | INTRAMUSCULAR | Status: DC | PRN
  Administered 2013-09-28 (×2): 0.5 mg via INTRAVENOUS

## 2013-09-28 MED ORDER — FLUTICASONE PROPIONATE 50 MCG/ACT NA SUSP: 2.0000 | Freq: Two times a day (BID) | NASAL | Status: DC | PRN

## 2013-09-28 MED ORDER — HYDROCODONE-ACETAMINOPHEN 5-325 MG PO TABS: 1.0000 | ORAL_TABLET | ORAL | Status: DC | PRN

## 2013-09-28 MED ORDER — HYDROMORPHONE HCL PF 1 MG/ML IJ SOLN
INTRAMUSCULAR | Status: AC
  Filled 2013-09-28: qty 1

## 2013-09-28 MED ORDER — OXYCODONE-ACETAMINOPHEN 5-325 MG PO TABS
ORAL_TABLET | ORAL | Status: AC
  Administered 2013-09-28: 2 via ORAL
  Filled 2013-09-28: qty 2

## 2013-09-28 MED ORDER — ALBUTEROL SULFATE HFA 108 (90 BASE) MCG/ACT IN AERS
2.0000 | INHALATION_SPRAY | Freq: Four times a day (QID) | RESPIRATORY_TRACT | Status: DC | PRN
  Filled 2013-09-28: qty 6.7

## 2013-09-28 MED ORDER — IPRATROPIUM BROMIDE 0.02 % IN SOLN: 0.5000 mg | Freq: Four times a day (QID) | RESPIRATORY_TRACT | Status: DC | PRN

## 2013-09-28 MED ORDER — HYDROMORPHONE HCL PF 1 MG/ML IJ SOLN
0.5000 mg | INTRAMUSCULAR | Status: DC | PRN
  Administered 2013-09-28 – 2013-09-29 (×2): 1 mg via INTRAVENOUS
  Filled 2013-09-28 (×2): qty 1

## 2013-09-28 MED ORDER — SODIUM CHLORIDE 0.9 % IV SOLN
INTRAVENOUS | Status: DC
  Administered 2013-09-28: 19:00:00 via INTRAVENOUS

## 2013-09-28 MED ORDER — METOCLOPRAMIDE HCL 5 MG/ML IJ SOLN
5.0000 mg | Freq: Three times a day (TID) | INTRAMUSCULAR | Status: DC | PRN
  Administered 2013-09-28: 10 mg via INTRAVENOUS

## 2013-09-28 MED ORDER — LIDOCAINE HCL (CARDIAC) 20 MG/ML IV SOLN
INTRAVENOUS | Status: DC | PRN
  Administered 2013-09-28: 100 mg via INTRAVENOUS

## 2013-09-28 MED ORDER — ATENOLOL 100 MG PO TABS
100.0000 mg | ORAL_TABLET | Freq: Every morning | ORAL | Status: DC
  Administered 2013-09-29: 100 mg via ORAL
  Filled 2013-09-28: qty 1

## 2013-09-28 SURGICAL SUPPLY — 38 items
BANDAGE GAUZE ELAST BULKY 4 IN (GAUZE/BANDAGES/DRESSINGS) ×3 IMPLANT
BLADE SAW SGTL HD 18.5X60.5X1. (BLADE) ×2 IMPLANT
BLADE SURG 10 STRL SS (BLADE) IMPLANT
BNDG COHESIVE 4X5 TAN STRL (GAUZE/BANDAGES/DRESSINGS) ×3 IMPLANT
CLOTH BEACON ORANGE TIMEOUT ST (SAFETY) ×2 IMPLANT
COVER SURGICAL LIGHT HANDLE (MISCELLANEOUS) ×2 IMPLANT
CUFF TOURNIQUET SINGLE 34IN LL (TOURNIQUET CUFF) IMPLANT
CUFF TOURNIQUET SINGLE 44IN (TOURNIQUET CUFF) IMPLANT
DRAPE U-SHAPE 47X51 STRL (DRAPES) ×2 IMPLANT
DRSG ADAPTIC 3X8 NADH LF (GAUZE/BANDAGES/DRESSINGS) ×2 IMPLANT
DRSG PAD ABDOMINAL 8X10 ST (GAUZE/BANDAGES/DRESSINGS) ×1 IMPLANT
DURAPREP 26ML APPLICATOR (WOUND CARE) ×2 IMPLANT
ELECT REM PT RETURN 9FT ADLT (ELECTROSURGICAL) ×2
ELECTRODE REM PT RTRN 9FT ADLT (ELECTROSURGICAL) ×1 IMPLANT
GLOVE BIOGEL PI IND STRL 9 (GLOVE) ×1 IMPLANT
GLOVE BIOGEL PI INDICATOR 9 (GLOVE) ×1
GLOVE SURG ORTHO 9.0 STRL STRW (GLOVE) ×2 IMPLANT
GOWN PREVENTION PLUS XLARGE (GOWN DISPOSABLE) ×2 IMPLANT
GOWN SRG XL XLNG 56XLVL 4 (GOWN DISPOSABLE) ×2 IMPLANT
GOWN STRL NON-REIN XL XLG LVL4 (GOWN DISPOSABLE) ×4
KIT BASIN OR (CUSTOM PROCEDURE TRAY) ×2 IMPLANT
KIT ROOM TURNOVER OR (KITS) ×2 IMPLANT
MANIFOLD NEPTUNE II (INSTRUMENTS) ×2 IMPLANT
NS IRRIG 1000ML POUR BTL (IV SOLUTION) ×2 IMPLANT
PACK ORTHO EXTREMITY (CUSTOM PROCEDURE TRAY) ×2 IMPLANT
PAD ARMBOARD 7.5X6 YLW CONV (MISCELLANEOUS) ×4 IMPLANT
PAD CAST 4YDX4 CTTN HI CHSV (CAST SUPPLIES) ×1 IMPLANT
PADDING CAST COTTON 4X4 STRL (CAST SUPPLIES) ×2
SPONGE GAUZE 4X4 12PLY (GAUZE/BANDAGES/DRESSINGS) ×2 IMPLANT
SPONGE LAP 18X18 X RAY DECT (DISPOSABLE) ×2 IMPLANT
STAPLER VISISTAT 35W (STAPLE) ×2 IMPLANT
SUT ETHILON 2 0 PSLX (SUTURE) ×6 IMPLANT
SUT VIC AB 2-0 CTB1 (SUTURE) ×4 IMPLANT
TOWEL OR 17X24 6PK STRL BLUE (TOWEL DISPOSABLE) ×2 IMPLANT
TOWEL OR 17X26 10 PK STRL BLUE (TOWEL DISPOSABLE) ×2 IMPLANT
TUBE CONNECTING 12X1/4 (SUCTIONS) ×2 IMPLANT
WATER STERILE IRR 1000ML POUR (IV SOLUTION) ×2 IMPLANT
YANKAUER SUCT BULB TIP NO VENT (SUCTIONS) ×2 IMPLANT

## 2013-09-28 NOTE — H&P (Signed)
Jennifer [name] is an 52 y.o. female.   Chief Complaint: Osteomyelitis abscess left forefoot HPI: Patient is a 52 year old woman with peripheral vascular disease diabetes who has had progressive ulceration and osteomyelitis of the left forefoot  Past Medical History  Diagnosis Date  . Depression   . GERD (gastroesophageal reflux disease)   . Hyperlipidemia   . Hypertension   . Glaucoma   . PVD (peripheral vascular disease) with claudication     Stents to bilateral common iliac arteries (left 2005, right 2008), on chronic plavix  . Diabetic peripheral neuropathy   . Hyperplastic colon polyp 12/2010    Per colonoscopy (12/2010) - Dr. Arlyce Dice  . Asthma   . COPD 01/08/2007    PFT's 05/2007 : FEV1/FVC 82, FEV1 64% pred, FEF 25-75% 40% predicted, 16% improvement in FEV1 with bronchodilators.     . Type II diabetes mellitus with peripheral circulatory disorders, uncontrolled (250.72) DX: 1993    Insulin dep. Poor control. Complicated by diabetic foot ulcer and diabetic eye disease.    . Tobacco abuse   . Chronic osteomyelitis of foot     chronic, right secondary to diabetic foot ulcers  . Polymicrobial bacterial infection 01/2013    GBS and S. aureus bacteremia // Source likely infected diabetic foot ulcer  . 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 Blood cultures 01/26/13 S. Aureus and GBS. Blood cultures Feb 6th, 8th, and 9th and March were negative.Repeat TEE 3/20 negative for vegitations  . 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 Chronic diastolic heart failure     grade 2 per 2D echocardiogram (01/2013)  . Ulcer of foot, chronic     Left. No OM per MRI (01/2013)  . DVT of upper extremity (deep vein thrombosis) 03/11/2013    Secondary to PICC line. Right brachial vein, diagnosed on 03/10/2013 Coumadin for 3 months. End date 06/10/2013   . Chronic pain syndrome 12/03/2011    Likely secondary to depression, "fibromyalgia",  neuropathy, and obesity. Lumbar MRI 2014 no sig change from prior (2008) : Stable hypertrophic facet disease most notable at L4-5. Stable shallow left foraminal/extraforaminal disc protrusion at L4-5. No direct neural compression.      . Environmental allergies     Hx: of  . Fatty liver 2003    observed on ultrasound abdomen  . CHF (congestive heart failure)   . Anginal pain     '3' of 10 ischemia ruled out 9/9   . Shortness of breath   . Fibromyalgia     Past Surgical History  Procedure Laterality Date  . Abdominal hysterectomy  1997    secondary to uterine fibroids  . Tubal ligation    . Rotator cuff repair      bilateral  . Breast biopsy      multiple-benign per pt  . Wrist surgery      right  . Ganglion cyst excision      multiple  . Other surgical history      stents in lower ext  . Tee without cardioversion N/A 01/31/2013    Procedure: TRANSESOPHAGEAL ECHOCARDIOGRAM (TEE);  Surgeon: Pricilla Riffle, MD;  Location: [facility] ENDOSCOPY;  Service: Cardiovascular;  Laterality: N/A;  Rm [phone]  . Bladder surgery      bladder reconstruction surgery  . Tee without cardioversion N/A 03/10/2013    Procedure: TRANSESOPHAGEAL ECHOCARDIOGRAM (TEE);  Surgeon: Laurey Morale, MD;  Location: [facility] ENDOSCOPY;  Service: Cardiovascular;  Laterality: N/A;  Rm. [phone]  . Skin grafts Bilateral 05/13/2013    Dr Lajoyce Corners  . Skin split graft Bilateral 05/13/2013    Procedure: Right and Left Foot Allograft Skin Graft;  Surgeon: Nadara Mustard, MD;  Location: [facility] OR;  Service: Orthopedics;  Laterality: Bilateral;  Right and Left Foot Allograft Skin Graft  . Toe amputation Left 08/31/2013    4TH & 5 TH TOE   . Amputation Left 08/31/2013    Procedure: AMPUTATION RAY;  Surgeon: Nadara Mustard, MD;  Location: [facility] OR;  Service: Orthopedics;  Laterality: Left;  Left Foot 5th Ray Amputation  . Esophagogastroduodenoscopy N/A 09/20/2013    Procedure: ESOPHAGOGASTRODUODENOSCOPY (EGD);  Surgeon: Beverley Fiedler, MD;  [facility]  ENDOSCOPY;  Service: Gastroenterology;  Laterality: N/A;    Family History  Problem Relation Age of Onset  . Diverticulosis Mother   . Diabetes Mother   . Hypertension Mother   . Congestive Heart Failure Mother   . Asthma Father   . CAD Sister 9    MI at age 94 per patient.  However, she has not had a stent or CABG.    Social History:  reports that she has been smoking Cigarettes.  She has a 20 pack-year smoking history. She has never used smokeless tobacco. She reports that she does not drink alcohol or use illicit drugs.  Allergies:  Allergies  Allergen Reactions  . Iohexol      Desc: IV CONTRAST CAUSE NEPHROPATHY IN 2007   . Ivp Dye [Iodinated Diagnostic Agents]   . Morphine Sulfate Itching and Rash    No prescriptions prior to admission    No results found for this or any previous visit (from the past 48 hour(s)). No results found.  Review of Systems  All other systems reviewed and are negative.    There were no vitals taken for this visit. Physical Exam  On examination patient has ulceration ostomy myelitis of the left forefoot she has failed conservative therapy. Assessment/Plan Assessment: Osteomyelitis ulceration left forefoot.  Plan: We'll plan for left midfoot amputation. Risks and benefits were discussed including infection neurovascular injury persistent pain DVT pulmonary embolus need for additional surgery. Patient states she understands and wished to proceed at this time.  Dwaine Pringle V 09/28/2013, 6:51 AM

## 2013-09-28 NOTE — Progress Notes (Signed)
[facility]  Patient Status: Active (receiving services up to time of hospitalization)  [facility] is providing the following services: RN and PT  If patient discharges after hours, please call [phone].   Jennifer [name] 09/28/2013, 2:51 PM

## 2013-09-28 NOTE — Anesthesia Preprocedure Evaluation (Signed)
Anesthesia Evaluation  Patient identified by MRN, date of birth, ID band Patient awake    Reviewed: Allergy & Precautions, H&P , NPO status , Patient's Chart, lab work & pertinent test results  Airway Mallampati: I TM Distance: >3 FB Neck ROM: full    Dental   Pulmonary asthma , COPDCurrent Smoker,          Cardiovascular hypertension, + angina + CAD, + Peripheral Vascular Disease and +CHF Rhythm:regular Rate:Normal     Neuro/Psych PSYCHIATRIC DISORDERS Depression  Neuromuscular disease    GI/Hepatic GERD-  ,  Endo/Other  diabetes, Type 2, Oral Hypoglycemic Agents  Renal/GU      Musculoskeletal  (+) Fibromyalgia -  Abdominal   Peds  Hematology  (+) Blood dyscrasia, ,   Anesthesia Other Findings   Reproductive/Obstetrics                           Anesthesia Physical Anesthesia Plan  ASA: III  Anesthesia Plan: General   Post-op Pain Management:    Induction: Intravenous  Airway Management Planned: LMA and Oral ETT  Additional Equipment:   Intra-op Plan:   Post-operative Plan: Extubation in OR  Informed Consent: I have reviewed the patients History and Physical, chart, labs and discussed the procedure including the risks, benefits and alternatives for the proposed anesthesia with the patient or authorized representative who has indicated his/her understanding and acceptance.     Plan Discussed with: CRNA, Anesthesiologist and Surgeon  Anesthesia Plan Comments:         Anesthesia Quick Evaluation

## 2013-09-28 NOTE — Progress Notes (Signed)
PHARMACIST - PHYSICIAN COMMUNICATION DR:  Lajoyce Corners CONCERNING:  METFORMIN SAFE ADMINISTRATION POLICY  RECOMMENDATION: Metformin has been placed on DISCONTINUE (rejected order) STATUS and should be reordered only after any of the conditions below are ruled out.  Current safety recommendations include avoiding metformin for a minimum of 48 hours after the patient's exposure to intravenous contrast media.  DESCRIPTION:  The Pharmacy Committee has adopted a policy that restricts the use of metformin in hospitalized patients until all the contraindications to administration have been ruled out. Specific contraindications are: []  Serum creatinine ? 1.5 for males [x]  Serum creatinine ? 1.4 for females []  Shock, acute MI, sepsis, hypoxemia, dehydration []  Planned administration of intravenous iodinated contrast media []  Heart Failure patients with low EF []  Acute or chronic metabolic acidosis (including DKA)      Kalynne Womac C. Karsen Nakanishi, PharmD Clinical Pharmacist-Resident Pager: [phone] Pharmacy: [phone] 09/28/2013 4:57 PM

## 2013-09-28 NOTE — Op Note (Signed)
OPERATIVE REPORT  DATE OF SURGERY: 09/28/2013  PATIENT:  Jennifer [name],  52 y.o. female  PRE-OPERATIVE DIAGNOSIS:  Abscess Left Foot  POST-OPERATIVE DIAGNOSIS:  Abscess Left Foot  PROCEDURE:  Procedure(s): Left Midfoot amputation Local tissue rotation flap 10 x 20 cm  SURGEON:  Surgeon(s): Nadara Mustard, MD  ANESTHESIA:   general  EBL:  min ML  SPECIMEN:  No Specimen  TOURNIQUET:  * No tourniquets in log *  PROCEDURE DETAILS: Patient is a 52 year old woman with diabetes peripheral vascular disease status post foot salvage surgery for lateral ray amputation for which she has had dehiscence wound breakdown with gangrenous changes. Patient wished to pursue continued foot salvage surgery and she presents at this time for a midfoot amputation with rotation flap coverage. Risks and benefits were discussed including infection nonhealing of the wound need for additional surgery need for higher level amputation. Patient states she understands and wished to proceed at this time. Description of procedure patient brought to operating room underwent general anesthetic. After adequate levels of anesthesia were obtained patient's left lower extremity was prepped using DuraPrep and draped into a sterile field. An elliptical incision was made around the necrotic wound. A transmetatarsal amputation was performed. There was a large inferior medial flap of the that was created. The wound was irrigated with normal saline hemostasis was obtained. The medial lateral flap was then rotated Doris dorsal lateral. This was sutured with 2-0 nylon. There was no tension on the skin. The area of rotation flap was 10 cm x 20 cm. The wound was covered with Adaptic orthopedic sponges AB dressing Kerlix and Coban. Patient was extubated taken to the PACU in stable condition.  PLAN OF CARE: Admit to inpatient   PATIENT DISPOSITION:  PACU - hemodynamically stable.   Nadara Mustard, MD 09/28/2013 11:41 AM

## 2013-09-28 NOTE — Telephone Encounter (Signed)
Called to follow up with patient. Note she is having surgery today.  Will try to contact her after she gets home.

## 2013-09-28 NOTE — Anesthesia Postprocedure Evaluation (Signed)
  Anesthesia Post-op Note  Patient: Jennifer [name]  Procedure(s) Performed: Procedure(s) with comments: Left Midfoot amputation (Left) - Left Midfoot amputation  Patient Location: PACU  Anesthesia Type:General  Level of Consciousness: awake, oriented, sedated and patient cooperative  Airway and Oxygen Therapy: Patient Spontanous Breathing  Post-op Pain: mild  Post-op Assessment: Post-op Vital signs reviewed, Patient's Cardiovascular Status Stable, Respiratory Function Stable, Patent Airway, No signs of Nausea or vomiting and Pain level controlled  Post-op Vital Signs: stable  Complications: No apparent anesthesia complications

## 2013-09-28 NOTE — Transfer of Care (Signed)
Immediate Anesthesia Transfer of Care Note  Patient: Jennifer [name]  Procedure(s) Performed: Procedure(s) with comments: Left Midfoot amputation (Left) - Left Midfoot amputation  Patient Location: PACU  Anesthesia Type:General  Level of Consciousness: awake, alert  and oriented  Airway & Oxygen Therapy: Patient Spontanous Breathing and Patient connected to nasal cannula oxygen  Post-op Assessment: Report given to PACU RN and Post -op Vital signs reviewed and stable moving all extremities  Post vital signs: Reviewed and stable  Complications: No apparent anesthesia complications

## 2013-09-28 NOTE — Progress Notes (Signed)
Pt instructed not  To take diabetes medicines this am after she called to ask if she should take her insulin.

## 2013-09-28 NOTE — Anesthesia Procedure Notes (Signed)
Procedure Name: LMA Insertion Date/Time: 09/28/2013 11:01 AM Performed by: Orvilla Fus A Pre-anesthesia Checklist: Patient identified, Timeout performed, Emergency Drugs available, Suction available and Patient being monitored Patient Re-evaluated:Patient Re-evaluated prior to inductionOxygen Delivery Method: Circle system utilized Preoxygenation: Pre-oxygenation with 100% oxygen Intubation Type: IV induction Ventilation: Mask ventilation without difficulty LMA: LMA inserted LMA Size: 4.0 Number of attempts: 1 Placement Confirmation: breath sounds checked- equal and bilateral and positive ETCO2 Tube secured with: Tape Dental Injury: Teeth and Oropharynx as per pre-operative assessment

## 2013-09-29 LAB — GLUCOSE, CAPILLARY
Glucose-Capillary: 306 mg/dL — ABNORMAL HIGH (ref 70–99)
Glucose-Capillary: 406 mg/dL — ABNORMAL HIGH (ref 70–99)
Glucose-Capillary: 303 mg/dL — ABNORMAL HIGH (ref 70–99)

## 2013-09-29 MED ORDER — OXYCODONE-ACETAMINOPHEN 5-325 MG PO TABS: 1.0000 | ORAL_TABLET | ORAL | Status: DC | PRN

## 2013-09-29 MED ORDER — INSULIN ASPART PROT & ASPART (70-30 MIX) 100 UNIT/ML ~~LOC~~ SUSP
75.0000 [IU] | Freq: Every day | SUBCUTANEOUS | Status: DC
  Administered 2013-09-29: 75 [IU] via SUBCUTANEOUS
  Filled 2013-09-29: qty 10

## 2013-09-29 MED ORDER — METFORMIN HCL 500 MG PO TABS: 1000.0000 mg | ORAL_TABLET | Freq: Two times a day (BID) | ORAL | Status: DC

## 2013-09-29 NOTE — Progress Notes (Signed)
Rehab Admissions Coordinator Note:  Patient was screened by Trish Mage for appropriateness for an Inpatient Acute Rehab Consult.  At this time, we are recommending Skilled Nursing Facility or [facility] therapies if preferred.  I called and spoke with the insurance on site reviewer, Zelda.  Patient does not meet insurance diagnosis guidelines for acute inpatient rehab admission.  Insurance will consider SNF or HH therapies.  Call me for questions.  Lelon Frohlich M 09/29/2013, 10:25 AM  I can be reached at [phone].

## 2013-09-29 NOTE — Progress Notes (Signed)
Orthopedic Tech Progress Note Patient Details:  Jennifer [name] 08-May-1961 [id number]  Ortho Devices Type of Ortho Device: Postop shoe/boot Ortho Device/Splint Interventions: Application   [name] 09/29/2013, 9:33 AM

## 2013-09-29 NOTE — Discharge Planning (Signed)
cbg 303 at 1730, given usual dose of home insulin.  Copy of home instructions to pt who verbalizes understanding. Will dc per w/c to private car home with all personal belongings.

## 2013-09-29 NOTE — Progress Notes (Signed)
Inpatient Diabetes Program Recommendations  AACE/ADA: New Consensus Statement on Inpatient Glycemic Control (2013)  Target Ranges:  Prepandial:   less than 140 mg/dL      Peak postprandial:   less than 180 mg/dL (1-2 hours)      Critically ill patients:  140 - 180 mg/dL   Reason for Visit: Results for AMYIAH, GABA (MRN [id number]) as of 09/29/2013 12:02  Ref. Range 09/28/2013 21:45 09/29/2013 07:27 09/29/2013 11:25  Glucose-Capillary Latest Range: 70-99 mg/dL 440 (H) 102 (H) 725 (H)   Note CBG's elevated.  Patient takes Humalog 75/25 140 units in the AM and 75 units in the PM.   Please restart 1/2 of patients home dose of 75/25 while in the hospital (per hospital formulary will substitute Novolog 70/30).  Thanks,   Beryl Meager, RN, BC-ADM Inpatient Diabetes Coordinator Pager [phone]

## 2013-09-29 NOTE — Care Management Note (Signed)
  Page 1 of 1   09/29/2013     10:00:36 AM   CARE MANAGEMENT NOTE 09/29/2013  Patient:  Jennifer [name], Jennifer [name]   Account Number:  [id number]  Date Initiated:  09/29/2013  Documentation initiated by:  Ronny Flurry  Subjective/Objective Assessment:     Action/Plan:   Anticipated DC Date:  09/29/2013   Anticipated DC Plan:  HOME W HOME HEALTH SERVICES         Choice offered to / List presented to:             Status of service:   Medicare Important Message given?   (If response is "NO", the following Medicare IM given date fields will be blank) Date Medicare IM given:   Date Additional Medicare IM given:    Discharge Disposition:    Per UR Regulation:    If discussed at Long Length of Stay Meetings, dates discussed:    Comments:  09-29-13 PT recommending Inpatient rehab , referral given to CIR , denied by insurance for CIR .   Patient can go to SNF for rehab per [facility] with [facility] , however, patient refusing SNF . Patient wants to go home with [facility] ( aware ) and states she will have someone with her at all times ( brother and son ) . Bedside nurse aware and calling Dr Lajoyce Corners.  Ronny Flurry RN BSN [phone]   09-29-13 Patient active with [facility] for RN / PT . Ronny Flurry RN BSN [phone]

## 2013-09-29 NOTE — Evaluation (Signed)
Physical Therapy Evaluation Patient Details Name: Jennifer [name] MRN: [id number] DOB: 12/01/1961 Today's Date: 09/29/2013 Time: 4098-1191 PT Time Calculation (min): 35 min  PT Assessment / Plan / Recommendation History of Present Illness  Pt. underwent L midfoot amputation due to osteomyelitis ulcerations .  Pt. with h/o PVD and diabetes.  Clinical Impression  Pt. Is a pleasant and aware lady with new mid foot amputation and NWB status.  She is having difficulty with functional ambulation and will be alone much of time.  Her safety is of concern regarding her DC home.  She needs to be able to walk safely into and out of bathroom as her WC will not fit through bathroom door.  Needs ongoing PT to address these and below mobility issues.  Discussed this info and recommendation of post acute rehab with pt. And with Vassie as well as Dr. Audrie Lia office assistant.      PT Assessment  Patient needs continued PT services    Follow Up Recommendations  CIR;Supervision/Assistance - 24 hour;Supervision for mobility/OOB    Does the patient have the potential to tolerate intense rehabilitation      Barriers to Discharge Decreased caregiver support does not have 24 hour assist and currently needs this level of care for safety    Equipment Recommendations  None recommended by PT    Recommendations for Other Services Rehab consult   Frequency Min 5X/week    Precautions / Restrictions Precautions Precautions: Fall Required Braces or Orthoses: Other Brace/Splint (post op boot`) Other Brace/Splint: post op boot Restrictions Weight Bearing Restrictions: Yes LLE Weight Bearing: Non weight bearing   Pertinent Vitals/Pain See vitals tab       Mobility  Bed Mobility Bed Mobility: Supine to Sit;Sitting - Scoot to Edge of Bed Supine to Sit: 5: Supervision;HOB flat Sitting - Scoot to Edge of Bed: 5: Supervision Details for Bed Mobility Assistance: suprevisiton for safety but pt. managing without  rail and with HOB flat.  Needs increased time due to her body habitus Transfers Transfers: Sit to Stand;Stand to Sit Sit to Stand: 4: Min assist;From bed;With upper extremity assist;Other (comment) (one hand on bed, one hand on RW, unable to do 2 hands on bed) Stand to Sit: 4: Min assist;To chair/3-in-1;With upper extremity assist;With armrests Details for Transfer Assistance: Min assist needed to safely rise to stand and to control descent to sit.  Cues for technique and safety Ambulation/Gait Ambulation/Gait Assistance: 4: Min assist Ambulation Distance (Feet): 8 Feet (4' x 2) Assistive device: Rolling walker Ambulation/Gait Assistance Details: Pt. needed seated rest in between 2 short distances, and min assist for safety, stability and balance Gait Pattern: Step-to pattern (single leg hop) Gait velocity: decreased Stairs: No    Exercises     PT Diagnosis: Difficulty walking;Abnormality of gait;Generalized weakness;Acute pain  PT Problem List: Decreased activity tolerance;Decreased balance;Decreased mobility;Decreased knowledge of use of DME;Decreased knowledge of precautions;Pain PT Treatment Interventions: DME instruction;Gait training;Functional mobility training;Therapeutic activities;Therapeutic exercise;Balance training;Patient/family education     PT Goals(Current goals can be found in the care plan section) Acute Rehab PT Goals Patient Stated Goal: wants to ultimately return to caring for herself  in home enviornment PT Goal Formulation: With patient Time For Goal Achievement: 10/06/13 Potential to Achieve Goals: Good  Visit Information  Last PT Received On: 09/29/13 Assistance Needed: +1 History of Present Illness: Pt. underwent L midfoot amputation due to osteomyelitis ulcerations .  Pt. with h/o PVD and diabetes.       Prior Functioning  Home Living Family/patient expects to be discharged to:: Private residence Living Arrangements: Children (son) Available Help at  Discharge: Family;Available PRN/intermittently (son works now and is home intermittently) Type of Home: Apartment Home Access: Level entry (on back of apartment) Entrance Stairs-Rails: Left Home Layout: One level Home Equipment: Wheelchair - [employer] - 2 wheels;Bedside commode Additional Comments: pt reports she can go around to backdoor and does not have any steps  Prior Function Level of Independence: Independent with assistive device(s) Communication Communication: No difficulties    Cognition  Cognition Arousal/Alertness: Awake/alert Behavior During Therapy: WFL for tasks assessed/performed Overall Cognitive Status: Within Functional Limits for tasks assessed    Extremity/Trunk Assessment Upper Extremity Assessment Upper Extremity Assessment: Overall WFL for tasks assessed Lower Extremity Assessment Lower Extremity Assessment: Overall WFL for tasks assessed   Balance Balance Balance Assessed: Yes Dynamic Standing Balance Dynamic Standing - Balance Support: Bilateral upper extremity supported;During functional activity Dynamic Standing - Level of Assistance: 4: Min assist  End of Session PT - End of Session Equipment Utilized During Treatment: Gait belt;Other (comment) (post op boot not yet delivered to room) Activity Tolerance: Patient limited by fatigue Patient left: in chair;with call bell/phone within reach Nurse Communication: Mobility status;Patient requests pain meds;Weight bearing status;Other (comment) (pt.'s current level of decreased independence and safety)  GP Functional Assessment Tool Used: clinical observation Functional Limitation: Mobility: Walking and moving around Mobility: Walking and Moving Around Current Status [phone]): At least 40 percent but less than 60 percent impaired, limited or restricted Mobility: Walking and Moving Around Goal Status [phone]): At least 20 percent but less than 40 percent impaired, limited or restricted   Ferman Hamming 09/29/2013, 9:16 AM Weldon Picking PT Acute Rehab Services [phone] Beeper [phone]

## 2013-09-29 NOTE — Progress Notes (Signed)
10/9  Staff RN called to ask about 70/30 insulin dose.  Staff RN spoke with Dr. Lajoyce Corners and he said that she could start 1/2 the home dose.  Patient was just given Novolog 26 units for blood sugar of 406 mg/dl.  Suggested that staff RN wait to see what blood sugar is before dinner.  Depending on blood sugar at that time, call physician to make sure of dosage to give before supper and before discharge. Will continue to follow while in hospital.  Smith Mince RN BSN CDE

## 2013-09-29 NOTE — Discharge Summary (Signed)
Physician Discharge Summary  [name] MRN: [id number] DOB/AGE: [birth year] 52 y.o.  Admit date: 09/28/2013 Discharge date: 09/29/2013  Admission Diagnoses: Osteomyelitis ulceration left foot.  Discharge Diagnoses:  Osteomyelitis ulceration left foot. Active Problems:   * No active hospital problems. *   Discharged Condition: stable  Hospital Course: Patient's hospital course was essentially unremarkable. She underwent a midfoot amputation. Postoperatively patient was stable and was discharged to home in stable condition  Consults: None  Significant Diagnostic Studies: labs: Routine labs  Treatments: surgery: See operative note  Discharge Exam: Blood pressure 145/71, pulse 65, temperature 97.2 F (36.2 C), temperature source Oral, resp. rate 18, height 5\' 6"  (1.676 m), weight 123.8 kg (272 lb 14.9 oz), SpO2 98.00%. Incision/Wound: dressing clean dry and intact  Disposition: 01-Home or Self Care  Discharge Orders   Future Orders Complete By Expires   Call MD / Call 911  As directed    Comments:     If you experience chest pain or shortness of breath, CALL 911 and be transported to the hospital emergency room.  If you develope a fever above 101 F, pus (white drainage) or increased drainage or redness at the wound, or calf pain, call your surgeon's office.   Constipation Prevention  As directed    Comments:     Drink plenty of fluids.  Prune juice may be helpful.  You may use a stool softener, such as Colace (over the counter) 100 mg twice a day.  Use MiraLax (over the counter) for constipation as needed.   Diet - low sodium heart healthy  As directed    Increase activity slowly as tolerated  As directed    Non weight bearing  As directed    Questions:     Laterality:  left   Extremity:  Lower       Medication List         albuterol 108 (90 BASE) MCG/ACT inhaler  Commonly known as:  PROAIR HFA  Inhale 2 puffs into the lungs every 6 (six) hours as  needed for wheezing.     albuterol (5 MG/ML) 0.5% nebulizer solution  Commonly known as:  PROVENTIL  Take 0.5 mLs (2.5 mg total) by nebulization every 6 (six) hours as needed for wheezing or shortness of breath.     atenolol 100 MG tablet  Commonly known as:  TENORMIN  Take 1 tablet (100 mg total) by mouth every morning.     baclofen 10 MG tablet  Commonly known as:  LIORESAL  Take 1 tablet (10 mg total) by mouth 3 (three) times daily.     benazepril 40 MG tablet  Commonly known as:  LOTENSIN  Take 40 mg by mouth daily.     buPROPion 150 MG 12 hr tablet  Commonly known as:  WELLBUTRIN SR  Take 1 tablet (150 mg total) by mouth 2 (two) times daily.     diphenhydrAMINE 25 MG tablet  Commonly known as:  BENADRYL  Take 25 mg by mouth every 6 (six) hours.     esomeprazole 20 MG capsule  Commonly known as:  NEXIUM  Take 40 mg by mouth daily before breakfast.     fluticasone 50 MCG/ACT nasal spray  Commonly known as:  FLONASE  Place 2 sprays into the nose 2 (two) times daily as needed for allergies.     Fluticasone-Salmeterol 250-50 MCG/DOSE Aepb  Commonly known as:  ADVAIR DISKUS  Inhale 1 puff into the lungs 2 (two) times  daily.     furosemide 40 MG tablet  Commonly known as:  LASIX  Take 40 mg by mouth 2 (two) times daily. Take 1 tablet in the morning and 1 in the afternoon     glucose blood test strip  Check blood sugar up to 4 times a day before meals and bedtime Dx code- 250.72, insulin requiring     glucose blood test strip  Commonly known as:  ONETOUCH VERIO  Check blood sugar up to 4 times a day before meals and bedtime Dx code- 250.72 insulin-requiring     insulin lispro protamine-lispro (75-25) 100 UNIT/ML Susp injection  Commonly known as:  HUMALOG 75/25  Inject 75-140 Units into the skin 2 (two) times daily with a meal. Inject 140 units in the morning and 75 units in the evening     ipratropium 0.02 % nebulizer solution  Commonly known as:  ATROVENT  Take  2.5 mLs (0.5 mg total) by nebulization every 6 (six) hours as needed for wheezing.     metFORMIN 1000 MG tablet  Commonly known as:  GLUCOPHAGE  Take 1 tablet (1,000 mg total) by mouth 2 (two) times daily.     ONETOUCH DELICA LANCETS FINE Misc  - 1 each by Does not apply route 2 (two) times daily. Check blood sugar up to 4 times a day before meals and bedtime Dx code- 250.72, insulin requiring  - From 03/03/2013     oxyCODONE-acetaminophen 5-325 MG per tablet  Commonly known as:  ROXICET  Take 2 tablets by mouth every 6 (six) hours as needed for pain.     oxyCODONE-acetaminophen 5-325 MG per tablet  Commonly known as:  ROXICET  Take 1 tablet by mouth every 4 (four) hours as needed for pain.     pravastatin 40 MG tablet  Commonly known as:  PRAVACHOL  Take 1 tablet (40 mg total) by mouth every morning.     pregabalin 200 MG capsule  Commonly known as:  LYRICA  Take 200 mg by mouth 3 (three) times daily.     silver sulfADIAZINE 1 % cream  Commonly known as:  SILVADENE  Apply 1 application topically daily.     travoprost (benzalkonium) 0.004 % ophthalmic solution  Commonly known as:  TRAVATAN  1 drop at bedtime.     traZODone 100 MG tablet  Commonly known as:  DESYREL  Take 1 tablet (100 mg total) by mouth at bedtime.           Follow-up Information   Follow up with Ja Ohman V, MD In 2 weeks.   Specialty:  Orthopedic Surgery   Contact information:   [address] [place] [address] [phone]       Signed: Nadara Mustard 09/29/2013, 6:55 AM

## 2013-09-29 NOTE — Progress Notes (Signed)
Inpatient Diabetes Program Recommendations  AACE/ADA: New Consensus Statement on Inpatient Glycemic Control (2013)  Target Ranges:  Prepandial:   less than 140 mg/dL      Peak postprandial:   less than 180 mg/dL (1-2 hours)      Critically ill patients:  140 - 180 mg/dL   Reason for Visit: Results for Jennifer, [name] (MRN [id number]) as of 09/29/2013 15:14  Ref. Range 09/28/2013 21:45 09/29/2013 07:27 09/29/2013 11:25  Glucose-Capillary Latest Range: 70-99 mg/dL 956 (H) 213 (H) 086 (H)   Spoke to [employer].  She states she got order to resume home dose of 75-25.  Should be okay to resume home dose, however likely need to discontinue Novolog correction and meal coverage.  Patient to discharge after supper. Beryl Meager, RN, BC-ADM Inpatient Diabetes Coordinator Pager [phone]

## 2013-09-29 NOTE — Clinical Social Work Note (Signed)
CSW met with pt at bedside regarding PT recommendation for CIR v. SNF. Pt stated that if she could not get into CIR at [facility], pt would be returning home. Pt noted that her son would be able to care for her during the day, and that her brother would come over once her son left for work in the evenings. CM informed CSW that pt is not eligible for CIR due to insurance reasons. CSW signing off. Please re consult if pt discharge disposition changes.  Darlyn Chamber, MSW, LCSWA Clinical Social Work [phone]

## 2013-09-30 ENCOUNTER — Encounter (HOSPITAL_COMMUNITY): Payer: Self-pay | Admitting: Orthopedic Surgery

## 2013-09-30 ENCOUNTER — Telehealth: Payer: Self-pay | Admitting: *Deleted

## 2013-09-30 ENCOUNTER — Telehealth: Payer: Self-pay | Admitting: Dietician

## 2013-09-30 NOTE — Telephone Encounter (Signed)
Pt called stating she got oxycodone # 60 from Dr Lajoyce Corners on 10/9, she usually get #120 from [place].   Will you give her more for this month?    Pt # [phone]

## 2013-09-30 NOTE — Telephone Encounter (Signed)
Called patient to follow up after her recent hospitalization. She says she is going to  Dr. Audrie Lia office today at 3 PM after having foot amputation this week it is bleeding through her bandages. She reports she is taking her insulin, but cannot think about anything right now other than that due to her concern with her foot. Encouraged her to call us if we can be of assistance and to reschedule her appointments as soon as is possible.

## 2013-09-30 NOTE — Telephone Encounter (Signed)
This is complicated. So last got from me on 9/12. But since then, has been in hospital total of 6 days and not using her own supply. And got the #60 from Dr Lajoyce Corners. So by my calculations, she would be due for monthly Rx from me on 10/26. And, she was getting #240 although I am happy to go back to #120 per month.

## 2013-10-03 NOTE — Telephone Encounter (Signed)
I talked with pt and she said she had the # wrong for her oxycodone.  She gets # 240 a month.  She will fill the # 60 from Dr Lajoyce Corners and get the next refill from [place] on the 26th.

## 2013-10-07 ENCOUNTER — Other Ambulatory Visit: Payer: Self-pay | Admitting: *Deleted

## 2013-10-10 ENCOUNTER — Encounter: Payer: Self-pay | Admitting: Internal Medicine

## 2013-10-10 ENCOUNTER — Telehealth: Payer: Self-pay | Admitting: *Deleted

## 2013-10-10 ENCOUNTER — Other Ambulatory Visit: Payer: Self-pay | Admitting: *Deleted

## 2013-10-10 MED ORDER — ESOMEPRAZOLE MAGNESIUM 20 MG PO CPDR: 40.0000 mg | DELAYED_RELEASE_CAPSULE | Freq: Every day | ORAL | Status: DC

## 2013-10-10 NOTE — Telephone Encounter (Signed)
Ms Zee is correct. Gi had increased to from 20 to 40 QD in hospital Sep / early OCt. This did not make her D/C summary. I will increase to 40 for 6 weeks. After Thanksgiving, she can return to 20 QD. I sent to [facility] as that was what [facility] had set up for her.

## 2013-10-10 NOTE — Addendum Note (Signed)
Addended by: Blanch Media A on: 10/10/2013 12:05 PM   Modules accepted: Orders

## 2013-10-10 NOTE — Telephone Encounter (Signed)
Return [facility] call about clarification of Nexium. Talked w/Dr Rogelia Boga who wants to start pt on Nexium 20mg  BID x 6 weeks then  20mg  daily - qty of 135tabs instead of #90. Verbal order given to [facility] for qty #135.

## 2013-10-10 NOTE — Telephone Encounter (Signed)
Pt states while she was in hospital last time she was told to start 40mg 

## 2013-10-10 NOTE — Telephone Encounter (Signed)
I cannot figure out who and why increased her from 20 mg to 40 mg. She had EGD end of Sep inpt and was D/C'd on 20mg . There is no tele note asking her to increase to 40. Regardless, she got 6 month supply on 06/15/13 so she does not need a refill. If you can figure out who, when, and why increased it to 40, pls let me know. Thanks

## 2013-10-11 ENCOUNTER — Ambulatory Visit (HOSPITAL_COMMUNITY)
Admission: RE | Admit: 2013-10-11 | Discharge: 2013-10-11 | Disposition: A | Discharge status: Home / Self Care | Payer: Medicare Other | Source: Ambulatory Visit | Attending: Internal Medicine | Admitting: Internal Medicine

## 2013-10-11 ENCOUNTER — Ambulatory Visit (INDEPENDENT_AMBULATORY_CARE_PROVIDER_SITE_OTHER): Payer: Medicare Other | Admitting: Internal Medicine

## 2013-10-11 ENCOUNTER — Encounter: Payer: Self-pay | Admitting: Internal Medicine

## 2013-10-11 VITALS — BP 118/68 | HR 74 | Temp 98.6°F

## 2013-10-11 DIAGNOSIS — K219 Gastro-esophageal reflux disease without esophagitis: Secondary | ICD-10-CM

## 2013-10-11 DIAGNOSIS — I10 Essential (primary) hypertension: Secondary | ICD-10-CM

## 2013-10-11 DIAGNOSIS — E1159 Type 2 diabetes mellitus with other circulatory complications: Secondary | ICD-10-CM

## 2013-10-11 DIAGNOSIS — F17200 Nicotine dependence, unspecified, uncomplicated: Secondary | ICD-10-CM

## 2013-10-11 DIAGNOSIS — Z720 Tobacco use: Secondary | ICD-10-CM

## 2013-10-11 DIAGNOSIS — R079 Chest pain, unspecified: Secondary | ICD-10-CM | POA: Insufficient documentation

## 2013-10-11 LAB — GLUCOSE, CAPILLARY: Glucose-Capillary: 284 mg/dL — ABNORMAL HIGH (ref 70–99)

## 2013-10-11 MED ORDER — INSULIN LISPRO PROT & LISPRO (75-25 MIX) 100 UNIT/ML ~~LOC~~ SUSP: 75.0000 [IU] | Freq: Two times a day (BID) | SUBCUTANEOUS | Status: DC

## 2013-10-11 NOTE — Progress Notes (Signed)
[name], female   DOB: [birth year], 52 y.o.   MRN: [id number]  Subjective:   [name] female   DOB: 20-Jun-1961 52 y.o.   MRN: [id number]  HPI: [name] is a 52 y.o. with PMH of COPD, Hyperlipidemia, HTN, Morbid obesity, DM 2 with peripheral circulatory disorders, Chronic diastolic CHF, GERD, infective endocarditis, who presents to the clinic c/o of continued pain in her throat and neck.   She was admitted to the hospital in September with complaints of midsternal chest pain radiating into her throat and neck and jaw. ACS was ruled out and an extensive GI w/u revealed reactive gastropathy. She was placed on Nexium 20mg  po BID. She states that she was only given a 30 day supply of her medication and ran out of the medicine 6 days ago. Per pt, the Nexium does not improve her pain. The pain is intermittent, but per pt it is excruciating.   Pt states that her blood sugars have been low, and early one morning her CBG was in the 40s. She woke up to go to the bathroom on 10/18, and her blood sugar was in the 40s. She ate fruit, and a few hour later she rechecked her CBG and it was in the 60s. On 10/19 she again had blood sugars in the 40s and 60s; this was before breakfast. She has had other hypoglycemic episodes in the evening as well. She states that she's not able to eat 3 square meals a day because she cannot afford enough food. Despite this, she is still taking her Humalog 75/25 140 units in the morning and 75 units at night.   Of note, she does still smoke and states that she does not want to hear any lectures about smoking and her diabetes from anyone, as she has heard it enough. 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 Lajoyce Corners on 09/28/13.   Past Medical History  Diagnosis Date  . Depression   . GERD (gastroesophageal reflux disease)   . Hyperlipidemia   . Hypertension   . Glaucoma   . PVD (peripheral vascular disease) with  claudication     Stents to bilateral common iliac arteries (left 2005, right 2008), on chronic plavix  . Diabetic peripheral neuropathy   . Hyperplastic colon polyp 12/2010    Per colonoscopy (12/2010) - Dr. Arlyce Dice  . Asthma   . COPD 01/08/2007    PFT's 05/2007 : FEV1/FVC 82, FEV1 64% pred, FEF 25-75% 40% predicted, 16% improvement in FEV1 with bronchodilators.     . Type II diabetes mellitus with peripheral circulatory disorders, uncontrolled (250.72) DX: 1993    Insulin dep. Poor control. Complicated by diabetic foot ulcer and diabetic eye disease.    . Tobacco abuse   . Chronic osteomyelitis of foot     chronic, right secondary to diabetic foot ulcers  . Polymicrobial bacterial infection 01/2013    GBS and S. aureus bacteremia // Source likely infected diabetic foot ulcer  . 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 Blood cultures 01/26/13 S. Aureus and GBS. Blood cultures Feb 6th, 8th, and 9th and March were negative.Repeat TEE 3/20 negative for vegitations  . 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 Chronic diastolic heart failure     grade 2 per 2D echocardiogram (01/2013)  . Ulcer of foot, chronic     Left. No OM  per MRI (01/2013)  . DVT of upper extremity (deep vein thrombosis) 03/11/2013    Secondary to PICC line. Right brachial vein, diagnosed on 03/10/2013 Coumadin for 3 months. End date 06/10/2013   . Chronic pain syndrome 12/03/2011    Likely secondary to depression, "fibromyalgia", neuropathy, and obesity. Lumbar MRI 2014 no sig change from prior (2008) : Stable hypertrophic facet disease most notable at L4-5. Stable shallow left foraminal/extraforaminal disc protrusion at L4-5. No direct neural compression.      . Environmental allergies     Hx: of  . Fatty liver 2003    observed on ultrasound abdomen  . CHF (congestive heart failure)   . Anginal pain     '3' of 10 ischemia ruled out 9/9   . Fibromyalgia    . Chronic bronchitis     "I get it alot" (09/28/2013)  . Exertional shortness of breath   . Rheumatoid arthritis   . Arthritis of lumbar spine   . Chronic lower back pain    Current Outpatient Prescriptions  Medication Sig Dispense Refill  . albuterol (PROAIR HFA) 108 (90 BASE) MCG/ACT inhaler Inhale 2 puffs into the lungs every 6 (six) hours as needed for wheezing.  3 Inhaler  1  . albuterol (PROVENTIL) (5 MG/ML) 0.5% nebulizer solution Take 0.5 mLs (2.5 mg total) by nebulization every 6 (six) hours as needed for wheezing or shortness of breath.  20 mL  5  . atenolol (TENORMIN) 100 MG tablet Take 1 tablet (100 mg total) by mouth every morning.  90 tablet  1  . baclofen (LIORESAL) 10 MG tablet Take 1 tablet (10 mg total) by mouth 3 (three) times daily.  20 each  1  . benazepril (LOTENSIN) 40 MG tablet Take 40 mg by mouth daily.      Marland Kitchen buPROPion (WELLBUTRIN SR) 150 MG 12 hr tablet Take 1 tablet (150 mg total) by mouth 2 (two) times daily.  180 tablet  1  . diphenhydrAMINE (BENADRYL) 25 MG tablet Take 25 mg by mouth every 6 (six) hours.       Marland Kitchen esomeprazole (NEXIUM) 20 MG capsule Take 2 capsules (40 mg total) by mouth daily before breakfast.  90 capsule  0  . fluticasone (FLONASE) 50 MCG/ACT nasal spray Place 2 sprays into the nose 2 (two) times daily as needed for allergies.  48 g  1  . Fluticasone-Salmeterol (ADVAIR DISKUS) 250-50 MCG/DOSE AEPB Inhale 1 puff into the lungs 2 (two) times daily.  180 each  1  . furosemide (LASIX) 40 MG tablet Take 40 mg by mouth 2 (two) times daily. Take 1 tablet in the morning and 1 in the afternoon      . glucose blood (ONETOUCH VERIO) test strip Check blood sugar up to 4 times a day before meals and bedtime Dx code- 250.72 insulin-requiring  150 each  12  . glucose blood test strip Check blood sugar up to 4 times a day before meals and bedtime Dx code- 250.72, insulin requiring  150 each  12  . insulin lispro protamine-lispro (HUMALOG 75/25) (75-25) 100  UNIT/ML SUSP injection Inject 75-140 Units into the skin 2 (two) times daily with a meal. Inject 135 units in the morning and 70 units in the evening  10 mL    . ipratropium (ATROVENT) 0.02 % nebulizer solution Take 2.5 mLs (0.5 mg total) by nebulization every 6 (six) hours as needed for wheezing.  75 mL  1  . metFORMIN (GLUCOPHAGE) 1000 MG tablet  Take 1 tablet (1,000 mg total) by mouth 2 (two) times daily.  180 tablet  1  . ONETOUCH DELICA LANCETS FINE MISC 1 each by Does not apply route 2 (two) times daily. Check blood sugar up to 4 times a day before meals and bedtime Dx code- 250.72, insulin requiring From 03/03/2013  100 each  12  . oxyCODONE-acetaminophen (ROXICET) 5-325 MG per tablet Take 2 tablets by mouth every 6 (six) hours as needed for pain.  240 tablet  0  . pravastatin (PRAVACHOL) 40 MG tablet Take 1 tablet (40 mg total) by mouth every morning.  90 tablet  1  . pregabalin (LYRICA) 200 MG capsule Take 200 mg by mouth 3 (three) times daily.      . travoprost, benzalkonium, (TRAVATAN) 0.004 % ophthalmic solution 1 drop at bedtime.      . traZODone (DESYREL) 100 MG tablet Take 1 tablet (100 mg total) by mouth at bedtime.  90 tablet  1   No current facility-administered medications for this visit.   Family History  Problem Relation Age of Onset  . Diverticulosis Mother   . Diabetes Mother   . Hypertension Mother   . Congestive Heart Failure Mother   . Asthma Father   . CAD Sister 10    MI at age 36 per patient.  However, she has not had a stent or CABG.    History   Social History  . Marital Status: Single    Spouse Name: N/A    Number of Children: 2  . Years of Education: college   Occupational History  . Disability     previously worked as a Psychologist, counselling History Main Topics  . Smoking status: Current Every Day Smoker -- 0.50 packs/day for 44 years    Types: Cigarettes  . Smokeless tobacco: Never Used  . Alcohol Use: No  . Drug Use: No     Comment: 09/28/2013 "no  marijuana since 2011, no crack/cocaine 1989"  . Sexual Activity: Not Currently     Comment: states she is currently trying to quit smoking   Other Topics Concern  . None   Social History Narrative   On disability. Lives with son in [place]. 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    Boyfriend passed away stage 4 cancer 03-04-2013.   Review of Systems: A 10 point ROS was performed; pertinent positives and negatives were noted in the HPI   Objective:  Physical Exam: Filed Vitals:   10/11/13 0931  BP: 118/68  Pulse: 74  Temp: 98.6 F (37 C)  TempSrc: Oral  SpO2: 96%   Constitutional: Vital signs reviewed.  Patient is an obese female in no acute distress and cooperative with exam.  Head: Normocephalic and atraumatic Eyes: PERRL, EOMI, conjunctivae normal, No scleral icterus.  Neck: Supple, Trachea midline normal ROM Cardiovascular: RRR, no MRG Pulmonary/Chest: normal respiratory effort, CTAB, no wheezes, rales, or rhonchi Abdominal: Soft. Non-tender, non-distended Musculoskeletal: Left foot s/p amputation at the mid foot.  Neurological: A&O x3, nonfocal Psychiatric: Pt is very frustrated and angry at times. Judgment and thought content normal. Cognition and memory are normal.   Assessment & Plan:   Please refer to Problem List based Assessment and Plan

## 2013-10-11 NOTE — Patient Instructions (Signed)
**  Decrease your night time insulin to 70 units, and decrease your morning insulin to 135 units to prevent the low blood sugar episodes that you are having. Try to have snacks on hand between meals to prevent low blood sugars. I would like for you to see [name] Plyler to discuss your blood sugars and to help with your diet.   **Continue to take the Nexium 20mg  2 times a day. Your pain will take time to improve, but continue to take the medication.

## 2013-10-13 ENCOUNTER — Encounter (HOSPITAL_COMMUNITY): Payer: Self-pay | Admitting: Pharmacy Technician

## 2013-10-13 ENCOUNTER — Encounter (HOSPITAL_COMMUNITY): Payer: Self-pay | Admitting: *Deleted

## 2013-10-13 NOTE — Assessment & Plan Note (Addendum)
Lab Results  Component Value Date   HGBA1C 8.0 09/16/2013   HGBA1C 8.0 05/11/2013   HGBA1C 9.5* 01/26/2013     Assessment: Diabetes control: fair control Progress toward A1C goal:  unchanged Comments: She did bring her meter and is having multiple episodes of hypoglycemia at different times throughout the day.  Plan: Medications:  Will decrease morning insulin to 135u and evening to 70u Home glucose monitoring: Frequency:   TID  Timing:   before meals Instruction/counseling given: reminded to get eye exam, reminded to bring blood glucose meter & log to each visit and reminded to bring medications to each visit Educational resources provided: brochure Self management tools provided:   Other plans: F/u with PCP in 1 mo

## 2013-10-13 NOTE — Assessment & Plan Note (Signed)
Patient continues to complain of chest pain, which is of the same nature that she had while in the hospital in September. It is more localized to her esophagus radiating into her throat, and into her jaw. She's been thoroughly worked up for this ruled out for cardiac vascular cause. GI performed an EGD which showed reactive gastropathy. An EKG was performed in the clinic and was unremarkable.

## 2013-10-13 NOTE — Assessment & Plan Note (Signed)
BP Readings from Last 3 Encounters:  10/11/13 118/68  09/29/13 101/60  09/29/13 101/60    Lab Results  Component Value Date   NA 137 09/28/2013   K 3.5 09/28/2013   CREATININE 1.56* 09/28/2013    Assessment: Blood pressure control: controlled Progress toward BP goal:  at goal   Plan: Medications:  continue current medications Educational resources provided: brochure Self management tools provided:   Other plans: F/u with PCP in 1 mo

## 2013-10-13 NOTE — Assessment & Plan Note (Signed)
  Assessment: Progress toward smoking cessation:  smoking the same amount Barriers to progress toward smoking cessation:  lack of motivation to quit Comments: Patient's tissue has been lectured multiple times regarding smoking cessation, and states she does not want to hear it this clinic visit. She actually became very irritated when the subject was brought up.  Plan: Instruction/counseling given:  I counseled patient on the dangers of tobacco use, advised patient to stop smoking, and reviewed strategies to maximize success. Educational resources provided:  QuitlineNC Designer, [employer]ellery) brochure Self management tools provided:  smoking cessation plan (STAR Quit Plan) Medications to assist with smoking cessation:  None Patient agreed to the following self-care plans for smoking cessation: go to the [employer] (www.quitlinenc.com);cut down the number of cigarettes smoked

## 2013-10-13 NOTE — Assessment & Plan Note (Addendum)
The patient continues complain of chest pain, which is thought to be associated with her gastroesophageal reflux disease.  EGDshowed reactive gastropathy. H. pylori is negative. She denies any dysphagia, states she is able to easily swallow liquids and solid foods. She states that she has been out of her Nexium for the past 6-7 days, but states she does not feel that the Nexium is done anything to improve her pain. Her Nexium was refilled by Dr. Rogelia Boga on 10/10/13. I encouraged the patient to continue taking the Nexium 40 mg a day until Thanksgiving, and give the medicine the chance to become effective and help her body heal.  - Continue the Nexium 40 mg each day

## 2013-10-14 ENCOUNTER — Other Ambulatory Visit (HOSPITAL_COMMUNITY): Payer: Self-pay | Admitting: Orthopedic Surgery

## 2013-10-14 ENCOUNTER — Inpatient Hospital Stay (HOSPITAL_COMMUNITY): Payer: Medicare Other | Admitting: Anesthesiology

## 2013-10-14 ENCOUNTER — Encounter (HOSPITAL_COMMUNITY): Payer: Self-pay | Admitting: *Deleted

## 2013-10-14 ENCOUNTER — Inpatient Hospital Stay (HOSPITAL_COMMUNITY)
Admission: RE | Admit: 2013-10-14 | Discharge: 2013-10-17 | DRG: 475 | Disposition: A | Discharge status: Skilled Nursing Facility | Payer: Medicare Other | Source: Ambulatory Visit | Attending: Orthopedic Surgery | Admitting: Orthopedic Surgery

## 2013-10-14 ENCOUNTER — Encounter (HOSPITAL_COMMUNITY): Payer: Medicare Other | Admitting: Anesthesiology

## 2013-10-14 ENCOUNTER — Encounter (HOSPITAL_COMMUNITY)
Admission: RE | Disposition: A | Discharge status: Skilled Nursing Facility | Payer: Self-pay | Source: Ambulatory Visit | Attending: Orthopedic Surgery

## 2013-10-14 DIAGNOSIS — G894 Chronic pain syndrome: Secondary | ICD-10-CM | POA: Diagnosis present

## 2013-10-14 DIAGNOSIS — L02619 Cutaneous abscess of unspecified foot: Secondary | ICD-10-CM | POA: Diagnosis present

## 2013-10-14 DIAGNOSIS — H409 Unspecified glaucoma: Secondary | ICD-10-CM | POA: Diagnosis present

## 2013-10-14 DIAGNOSIS — F3289 Other specified depressive episodes: Secondary | ICD-10-CM | POA: Diagnosis present

## 2013-10-14 DIAGNOSIS — E785 Hyperlipidemia, unspecified: Secondary | ICD-10-CM | POA: Diagnosis present

## 2013-10-14 DIAGNOSIS — L97509 Non-pressure chronic ulcer of other part of unspecified foot with unspecified severity: Secondary | ICD-10-CM | POA: Diagnosis present

## 2013-10-14 DIAGNOSIS — L97409 Non-pressure chronic ulcer of unspecified heel and midfoot with unspecified severity: Secondary | ICD-10-CM | POA: Diagnosis present

## 2013-10-14 DIAGNOSIS — L03119 Cellulitis of unspecified part of limb: Secondary | ICD-10-CM | POA: Diagnosis present

## 2013-10-14 DIAGNOSIS — Z794 Long term (current) use of insulin: Secondary | ICD-10-CM

## 2013-10-14 DIAGNOSIS — J449 Chronic obstructive pulmonary disease, unspecified: Secondary | ICD-10-CM | POA: Diagnosis present

## 2013-10-14 DIAGNOSIS — K219 Gastro-esophageal reflux disease without esophagitis: Secondary | ICD-10-CM | POA: Diagnosis present

## 2013-10-14 DIAGNOSIS — M86679 Other chronic osteomyelitis, unspecified ankle and foot: Secondary | ICD-10-CM | POA: Diagnosis present

## 2013-10-14 DIAGNOSIS — S88119A Complete traumatic amputation at level between knee and ankle, unspecified lower leg, initial encounter: Secondary | ICD-10-CM

## 2013-10-14 DIAGNOSIS — I96 Gangrene, not elsewhere classified: Secondary | ICD-10-CM | POA: Diagnosis present

## 2013-10-14 DIAGNOSIS — Z79899 Other long term (current) drug therapy: Secondary | ICD-10-CM

## 2013-10-14 DIAGNOSIS — Z6841 Body Mass Index (BMI) 40.0 and over, adult: Secondary | ICD-10-CM

## 2013-10-14 DIAGNOSIS — E1149 Type 2 diabetes mellitus with other diabetic neurological complication: Secondary | ICD-10-CM | POA: Diagnosis present

## 2013-10-14 DIAGNOSIS — M069 Rheumatoid arthritis, unspecified: Secondary | ICD-10-CM | POA: Diagnosis present

## 2013-10-14 DIAGNOSIS — J4489 Other specified chronic obstructive pulmonary disease: Secondary | ICD-10-CM | POA: Diagnosis present

## 2013-10-14 DIAGNOSIS — I5032 Chronic diastolic (congestive) heart failure: Secondary | ICD-10-CM | POA: Diagnosis present

## 2013-10-14 DIAGNOSIS — Z7901 Long term (current) use of anticoagulants: Secondary | ICD-10-CM

## 2013-10-14 DIAGNOSIS — F329 Major depressive disorder, single episode, unspecified: Secondary | ICD-10-CM | POA: Diagnosis present

## 2013-10-14 DIAGNOSIS — IMO0002 Reserved for concepts with insufficient information to code with codable children: Secondary | ICD-10-CM | POA: Diagnosis present

## 2013-10-14 DIAGNOSIS — I509 Heart failure, unspecified: Secondary | ICD-10-CM | POA: Diagnosis present

## 2013-10-14 DIAGNOSIS — E1165 Type 2 diabetes mellitus with hyperglycemia: Secondary | ICD-10-CM | POA: Diagnosis present

## 2013-10-14 DIAGNOSIS — M9080 Osteopathy in diseases classified elsewhere, unspecified site: Secondary | ICD-10-CM | POA: Diagnosis present

## 2013-10-14 DIAGNOSIS — E669 Obesity, unspecified: Secondary | ICD-10-CM | POA: Diagnosis present

## 2013-10-14 DIAGNOSIS — Z86718 Personal history of other venous thrombosis and embolism: Secondary | ICD-10-CM

## 2013-10-14 DIAGNOSIS — F17200 Nicotine dependence, unspecified, uncomplicated: Secondary | ICD-10-CM | POA: Diagnosis present

## 2013-10-14 DIAGNOSIS — I10 Essential (primary) hypertension: Secondary | ICD-10-CM | POA: Diagnosis present

## 2013-10-14 DIAGNOSIS — E1142 Type 2 diabetes mellitus with diabetic polyneuropathy: Secondary | ICD-10-CM | POA: Diagnosis present

## 2013-10-14 DIAGNOSIS — T8789 Other complications of amputation stump: Principal | ICD-10-CM | POA: Diagnosis present

## 2013-10-14 DIAGNOSIS — Y835 Amputation of limb(s) as the cause of abnormal reaction of the patient, or of later complication, without mention of misadventure at the time of the procedure: Secondary | ICD-10-CM | POA: Diagnosis present

## 2013-10-14 HISTORY — PX: AMPUTATION: SHX166

## 2013-10-14 LAB — GLUCOSE, CAPILLARY
Glucose-Capillary: 75 mg/dL (ref 70–99)
Glucose-Capillary: 106 mg/dL — ABNORMAL HIGH (ref 70–99)
Glucose-Capillary: 62 mg/dL — ABNORMAL LOW (ref 70–99)
Glucose-Capillary: 72 mg/dL (ref 70–99)
Glucose-Capillary: 118 mg/dL — ABNORMAL HIGH (ref 70–99)

## 2013-10-14 LAB — BASIC METABOLIC PANEL
BUN: 15 mg/dL (ref 6–23)
CO2: 29 mEq/L (ref 19–32)
Calcium: 8.9 mg/dL (ref 8.4–10.5)
Chloride: 103 mEq/L (ref 96–112)
Creatinine, Ser: 0.8 mg/dL (ref 0.50–1.10)
GFR calc Af Amer: >90 mL/min (ref >90)
GFR calc non Af Amer: 82 mL/min — ABNORMAL LOW (ref >90)
Glucose, Bld: 78 mg/dL (ref 70–99)
Potassium: 3.8 mEq/L (ref 3.5–5.1)
Sodium: 143 mEq/L (ref 135–145)

## 2013-10-14 LAB — CBC
HCT: 31.9 % — ABNORMAL LOW (ref 36.0–46.0)
Hemoglobin: 10.3 g/dL — ABNORMAL LOW (ref 12.0–15.0)
MCH: 30 pg (ref 26.0–34.0)
MCHC: 32.3 g/dL (ref 30.0–36.0)
MCV: 93 fL (ref 78.0–100.0)
Platelets: 277 10*3/uL (ref 150–400)
RBC: 3.43 MIL/uL — ABNORMAL LOW (ref 3.87–5.11)
RDW: 14.5 % (ref 11.5–15.5)
WBC: 9.7 10*3/uL (ref 4.0–10.5)

## 2013-10-14 SURGERY — AMPUTATION BELOW KNEE
Anesthesia: General | Site: Foot | Laterality: Left | Wound class: Clean

## 2013-10-14 MED ORDER — HYDROMORPHONE HCL PF 1 MG/ML IJ SOLN
0.5000 mg | INTRAMUSCULAR | Status: DC | PRN
  Administered 2013-10-14: 1 mg via INTRAVENOUS
  Filled 2013-10-14: qty 1

## 2013-10-14 MED ORDER — OXYCODONE HCL 5 MG/5ML PO SOLN: 5.0000 mg | Freq: Once | ORAL | Status: AC | PRN

## 2013-10-14 MED ORDER — LACTATED RINGERS IV SOLN
INTRAVENOUS | Status: DC | PRN
  Administered 2013-10-14 (×2): via INTRAVENOUS

## 2013-10-14 MED ORDER — MIDAZOLAM HCL 5 MG/5ML IJ SOLN
INTRAMUSCULAR | Status: DC | PRN
  Administered 2013-10-14: 1 mg via INTRAVENOUS

## 2013-10-14 MED ORDER — HYDROCODONE-ACETAMINOPHEN 5-325 MG PO TABS
1.0000 | ORAL_TABLET | ORAL | Status: DC | PRN
  Administered 2013-10-14 – 2013-10-16 (×7): 2 via ORAL
  Filled 2013-10-14 (×7): qty 2

## 2013-10-14 MED ORDER — DEXTROSE 50 % IV SOLN
INTRAVENOUS | Status: AC
  Administered 2013-10-14: 25 mL
  Filled 2013-10-14: qty 50

## 2013-10-14 MED ORDER — COUMADIN BOOK
Freq: Once | Status: DC
  Filled 2013-10-14: qty 1

## 2013-10-14 MED ORDER — DIPHENHYDRAMINE HCL 25 MG PO TABS
25.0000 mg | ORAL_TABLET | Freq: Four times a day (QID) | ORAL | Status: DC
  Filled 2013-10-14 (×3): qty 1

## 2013-10-14 MED ORDER — LIDOCAINE HCL (CARDIAC) 20 MG/ML IV SOLN
INTRAVENOUS | Status: DC | PRN
  Administered 2013-10-14: 80 mg via INTRAVENOUS

## 2013-10-14 MED ORDER — FENTANYL CITRATE 0.05 MG/ML IJ SOLN
INTRAMUSCULAR | Status: DC | PRN
  Administered 2013-10-14 (×4): 50 ug via INTRAVENOUS

## 2013-10-14 MED ORDER — METOCLOPRAMIDE HCL 10 MG PO TABS: 5.0000 mg | ORAL_TABLET | Freq: Three times a day (TID) | ORAL | Status: DC | PRN

## 2013-10-14 MED ORDER — PROMETHAZINE HCL 25 MG/ML IJ SOLN: 6.2500 mg | INTRAMUSCULAR | Status: DC | PRN

## 2013-10-14 MED ORDER — FUROSEMIDE 40 MG PO TABS
40.0000 mg | ORAL_TABLET | Freq: Two times a day (BID) | ORAL | Status: DC
  Administered 2013-10-14 – 2013-10-17 (×6): 40 mg via ORAL
  Filled 2013-10-14 (×9): qty 1

## 2013-10-14 MED ORDER — CEFAZOLIN SODIUM-DEXTROSE 2-3 GM-% IV SOLR
2.0000 g | Freq: Four times a day (QID) | INTRAVENOUS | Status: AC
  Administered 2013-10-14 – 2013-10-15 (×3): 2 g via INTRAVENOUS
  Filled 2013-10-14 (×3): qty 50

## 2013-10-14 MED ORDER — HYDROMORPHONE HCL PF 1 MG/ML IJ SOLN
INTRAMUSCULAR | Status: AC
  Administered 2013-10-14: 0.5 mg via INTRAVENOUS
  Filled 2013-10-14: qty 1

## 2013-10-14 MED ORDER — INSULIN ASPART 100 UNIT/ML ~~LOC~~ SOLN
0.0000 [IU] | Freq: Three times a day (TID) | SUBCUTANEOUS | Status: DC
  Administered 2013-10-15: 15 [IU] via SUBCUTANEOUS
  Administered 2013-10-15: 3 [IU] via SUBCUTANEOUS
  Administered 2013-10-16: 8 [IU] via SUBCUTANEOUS
  Administered 2013-10-16: 5 [IU] via SUBCUTANEOUS
  Administered 2013-10-16: 8 [IU] via SUBCUTANEOUS
  Administered 2013-10-17: 11 [IU] via SUBCUTANEOUS
  Administered 2013-10-17: 8 [IU] via SUBCUTANEOUS

## 2013-10-14 MED ORDER — MOMETASONE FURO-FORMOTEROL FUM 100-5 MCG/ACT IN AERO
2.0000 | INHALATION_SPRAY | Freq: Two times a day (BID) | RESPIRATORY_TRACT | Status: DC
  Administered 2013-10-14 – 2013-10-17 (×3): 2 via RESPIRATORY_TRACT
  Filled 2013-10-14 (×2): qty 8.8

## 2013-10-14 MED ORDER — GLYCOPYRROLATE 0.2 MG/ML IJ SOLN
INTRAMUSCULAR | Status: DC | PRN
  Administered 2013-10-14: 0.2 mg via INTRAVENOUS

## 2013-10-14 MED ORDER — 0.9 % SODIUM CHLORIDE (POUR BTL) OPTIME
TOPICAL | Status: DC | PRN
  Administered 2013-10-14: 1000 mL

## 2013-10-14 MED ORDER — ONDANSETRON HCL 4 MG/2ML IJ SOLN: 4.0000 mg | Freq: Four times a day (QID) | INTRAMUSCULAR | Status: DC | PRN

## 2013-10-14 MED ORDER — METFORMIN HCL 500 MG PO TABS
1000.0000 mg | ORAL_TABLET | Freq: Two times a day (BID) | ORAL | Status: DC
  Filled 2013-10-14 (×3): qty 2

## 2013-10-14 MED ORDER — METHOCARBAMOL 500 MG PO TABS
500.0000 mg | ORAL_TABLET | Freq: Four times a day (QID) | ORAL | Status: DC | PRN
  Administered 2013-10-15 – 2013-10-16 (×4): 500 mg via ORAL
  Filled 2013-10-14 (×4): qty 1

## 2013-10-14 MED ORDER — WARFARIN VIDEO: Freq: Once | Status: DC

## 2013-10-14 MED ORDER — LACTATED RINGERS IV SOLN
INTRAVENOUS | Status: DC
  Administered 2013-10-14: 10 mL/h via INTRAVENOUS

## 2013-10-14 MED ORDER — HYDROMORPHONE HCL PF 1 MG/ML IJ SOLN
0.2500 mg | INTRAMUSCULAR | Status: DC | PRN
  Administered 2013-10-14: 0.5 mg via INTRAVENOUS
  Administered 2013-10-14: 1 mg via INTRAVENOUS
  Administered 2013-10-14: 0.5 mg via INTRAVENOUS

## 2013-10-14 MED ORDER — METHOCARBAMOL 100 MG/ML IJ SOLN
500.0000 mg | Freq: Four times a day (QID) | INTRAVENOUS | Status: DC | PRN
  Filled 2013-10-14 (×2): qty 5

## 2013-10-14 MED ORDER — ONDANSETRON HCL 4 MG PO TABS: 4.0000 mg | ORAL_TABLET | Freq: Four times a day (QID) | ORAL | Status: DC | PRN

## 2013-10-14 MED ORDER — FLUTICASONE PROPIONATE 50 MCG/ACT NA SUSP
2.0000 | Freq: Two times a day (BID) | NASAL | Status: DC | PRN
  Filled 2013-10-14: qty 16

## 2013-10-14 MED ORDER — SIMVASTATIN 5 MG PO TABS
5.0000 mg | ORAL_TABLET | Freq: Every day | ORAL | Status: DC
  Administered 2013-10-14 – 2013-10-16 (×3): 5 mg via ORAL
  Filled 2013-10-14 (×4): qty 1

## 2013-10-14 MED ORDER — PROPOFOL 10 MG/ML IV BOLUS
INTRAVENOUS | Status: DC | PRN
  Administered 2013-10-14: 200 mg via INTRAVENOUS

## 2013-10-14 MED ORDER — OXYCODONE-ACETAMINOPHEN 5-325 MG PO TABS
1.0000 | ORAL_TABLET | ORAL | Status: DC | PRN
  Administered 2013-10-15: 1 via ORAL
  Administered 2013-10-16: 2 via ORAL
  Filled 2013-10-14: qty 1
  Filled 2013-10-14: qty 2

## 2013-10-14 MED ORDER — ATENOLOL 100 MG PO TABS
100.0000 mg | ORAL_TABLET | Freq: Every morning | ORAL | Status: DC
  Administered 2013-10-15 – 2013-10-17 (×3): 100 mg via ORAL
  Filled 2013-10-14 (×3): qty 1

## 2013-10-14 MED ORDER — CEFAZOLIN SODIUM-DEXTROSE 2-3 GM-% IV SOLR
INTRAVENOUS | Status: AC
  Administered 2013-10-14: 2 g via INTRAVENOUS
  Filled 2013-10-14: qty 50

## 2013-10-14 MED ORDER — BENAZEPRIL HCL 40 MG PO TABS
40.0000 mg | ORAL_TABLET | Freq: Every day | ORAL | Status: DC
  Administered 2013-10-14 – 2013-10-17 (×4): 40 mg via ORAL
  Filled 2013-10-14 (×4): qty 1

## 2013-10-14 MED ORDER — ALBUTEROL SULFATE (5 MG/ML) 0.5% IN NEBU: 2.5000 mg | INHALATION_SOLUTION | Freq: Four times a day (QID) | RESPIRATORY_TRACT | Status: DC | PRN

## 2013-10-14 MED ORDER — MEPERIDINE HCL 25 MG/ML IJ SOLN: 6.2500 mg | INTRAMUSCULAR | Status: DC | PRN

## 2013-10-14 MED ORDER — TRAVOPROST (BAK FREE) 0.004 % OP SOLN
1.0000 [drp] | Freq: Every day | OPHTHALMIC | Status: DC
  Administered 2013-10-14 – 2013-10-16 (×3): 1 [drp] via OPHTHALMIC
  Filled 2013-10-14: qty 2.5

## 2013-10-14 MED ORDER — INSULIN ASPART 100 UNIT/ML ~~LOC~~ SOLN
4.0000 [IU] | Freq: Three times a day (TID) | SUBCUTANEOUS | Status: DC
  Administered 2013-10-15 – 2013-10-17 (×7): 4 [IU] via SUBCUTANEOUS

## 2013-10-14 MED ORDER — METHOCARBAMOL 500 MG PO TABS
ORAL_TABLET | ORAL | Status: AC
  Administered 2013-10-14: 500 mg
  Filled 2013-10-14: qty 1

## 2013-10-14 MED ORDER — BUPROPION HCL ER (SR) 150 MG PO TB12
150.0000 mg | ORAL_TABLET | Freq: Two times a day (BID) | ORAL | Status: DC
  Administered 2013-10-14 – 2013-10-17 (×6): 150 mg via ORAL
  Filled 2013-10-14 (×7): qty 1

## 2013-10-14 MED ORDER — OXYCODONE HCL 5 MG PO TABS
ORAL_TABLET | ORAL | Status: AC
  Filled 2013-10-14: qty 1

## 2013-10-14 MED ORDER — WARFARIN SODIUM 7.5 MG PO TABS
7.5000 mg | ORAL_TABLET | Freq: Once | ORAL | Status: AC
  Administered 2013-10-14: 7.5 mg via ORAL
  Filled 2013-10-14: qty 1

## 2013-10-14 MED ORDER — TRAZODONE HCL 100 MG PO TABS
100.0000 mg | ORAL_TABLET | Freq: Every day | ORAL | Status: DC
  Administered 2013-10-14 – 2013-10-16 (×3): 100 mg via ORAL
  Filled 2013-10-14 (×4): qty 1

## 2013-10-14 MED ORDER — PREGABALIN 50 MG PO CAPS
200.0000 mg | ORAL_CAPSULE | Freq: Three times a day (TID) | ORAL | Status: DC
  Administered 2013-10-14 – 2013-10-17 (×9): 200 mg via ORAL
  Filled 2013-10-14 (×3): qty 4
  Filled 2013-10-14: qty 8
  Filled 2013-10-14 (×5): qty 4

## 2013-10-14 MED ORDER — HYDROMORPHONE HCL PF 1 MG/ML IJ SOLN
INTRAMUSCULAR | Status: AC
  Filled 2013-10-14: qty 1

## 2013-10-14 MED ORDER — BACLOFEN 10 MG PO TABS
10.0000 mg | ORAL_TABLET | Freq: Three times a day (TID) | ORAL | Status: DC
  Administered 2013-10-14 – 2013-10-17 (×9): 10 mg via ORAL
  Filled 2013-10-14 (×12): qty 1

## 2013-10-14 MED ORDER — IPRATROPIUM BROMIDE 0.02 % IN SOLN
0.5000 mg | Freq: Four times a day (QID) | RESPIRATORY_TRACT | Status: DC | PRN
Start: 2013-10-14 — End: 2013-10-17

## 2013-10-14 MED ORDER — METOCLOPRAMIDE HCL 5 MG/ML IJ SOLN: 5.0000 mg | Freq: Three times a day (TID) | INTRAMUSCULAR | Status: DC | PRN

## 2013-10-14 MED ORDER — ALBUTEROL SULFATE HFA 108 (90 BASE) MCG/ACT IN AERS
2.0000 | INHALATION_SPRAY | Freq: Four times a day (QID) | RESPIRATORY_TRACT | Status: DC | PRN
  Administered 2013-10-15 – 2013-10-16 (×2): 2 via RESPIRATORY_TRACT
  Filled 2013-10-14 (×2): qty 6.7

## 2013-10-14 MED ORDER — DEXTROSE 50 % IV SOLN
INTRAVENOUS | Status: DC | PRN
  Administered 2013-10-14: 12.5 g via INTRAVENOUS

## 2013-10-14 MED ORDER — ARTIFICIAL TEARS OP OINT
TOPICAL_OINTMENT | OPHTHALMIC | Status: DC | PRN
  Administered 2013-10-14: 1 via OPHTHALMIC

## 2013-10-14 MED ORDER — OXYCODONE HCL 5 MG PO TABS
5.0000 mg | ORAL_TABLET | Freq: Once | ORAL | Status: AC | PRN
Start: 2013-10-14 — End: 2013-10-14
  Administered 2013-10-14: 5 mg via ORAL

## 2013-10-14 MED ORDER — PANTOPRAZOLE SODIUM 40 MG PO TBEC
40.0000 mg | DELAYED_RELEASE_TABLET | Freq: Every day | ORAL | Status: DC
  Administered 2013-10-14 – 2013-10-17 (×4): 40 mg via ORAL
  Filled 2013-10-14 (×4): qty 1

## 2013-10-14 MED ORDER — WARFARIN - PHARMACIST DOSING INPATIENT: Freq: Every day | Status: DC

## 2013-10-14 MED ORDER — ONDANSETRON HCL 4 MG/2ML IJ SOLN
INTRAMUSCULAR | Status: DC | PRN
  Administered 2013-10-14: 4 mg via INTRAVENOUS

## 2013-10-14 MED ORDER — INSULIN GLARGINE 100 UNIT/ML ~~LOC~~ SOLN
20.0000 [IU] | Freq: Every day | SUBCUTANEOUS | Status: DC
  Administered 2013-10-14 – 2013-10-16 (×3): 20 [IU] via SUBCUTANEOUS
  Filled 2013-10-14 (×4): qty 0.2

## 2013-10-14 MED ORDER — MIDAZOLAM HCL 2 MG/2ML IJ SOLN: 0.5000 mg | Freq: Once | INTRAMUSCULAR | Status: DC | PRN

## 2013-10-14 MED ORDER — DIPHENHYDRAMINE HCL 25 MG PO CAPS
25.0000 mg | ORAL_CAPSULE | Freq: Four times a day (QID) | ORAL | Status: DC
  Administered 2013-10-14 – 2013-10-15 (×2): 25 mg via ORAL
  Filled 2013-10-14 (×14): qty 1

## 2013-10-14 MED ORDER — SODIUM CHLORIDE 0.9 % IV SOLN: INTRAVENOUS | Status: DC

## 2013-10-14 SURGICAL SUPPLY — 48 items
BANDAGE ESMARK 6X9 LF (GAUZE/BANDAGES/DRESSINGS) ×1 IMPLANT
BANDAGE GAUZE ELAST BULKY 4 IN (GAUZE/BANDAGES/DRESSINGS) ×3 IMPLANT
BLADE SAW RECIP 87.9 MT (BLADE) ×2 IMPLANT
BLADE SURG 21 STRL SS (BLADE) ×2 IMPLANT
BNDG CMPR 9X6 STRL LF SNTH (GAUZE/BANDAGES/DRESSINGS)
BNDG COHESIVE 6X5 TAN STRL LF (GAUZE/BANDAGES/DRESSINGS) ×3 IMPLANT
BNDG ESMARK 6X9 LF (GAUZE/BANDAGES/DRESSINGS)
CLOTH BEACON ORANGE TIMEOUT ST (SAFETY) ×2 IMPLANT
COVER SURGICAL LIGHT HANDLE (MISCELLANEOUS) ×2 IMPLANT
CUFF TOURNIQUET SINGLE 34IN LL (TOURNIQUET CUFF) ×1 IMPLANT
CUFF TOURNIQUET SINGLE 44IN (TOURNIQUET CUFF) IMPLANT
DRAIN PENROSE 1/2X12 LTX STRL (WOUND CARE) IMPLANT
DRAPE EXTREMITY T 121X128X90 (DRAPE) ×2 IMPLANT
DRAPE PROXIMA HALF (DRAPES) ×4 IMPLANT
DRAPE U-SHAPE 47X51 STRL (DRAPES) ×4 IMPLANT
DRSG ADAPTIC 3X8 NADH LF (GAUZE/BANDAGES/DRESSINGS) ×2 IMPLANT
DRSG PAD ABDOMINAL 8X10 ST (GAUZE/BANDAGES/DRESSINGS) ×4 IMPLANT
DURAPREP 26ML APPLICATOR (WOUND CARE) ×2 IMPLANT
ELECT REM PT RETURN 9FT ADLT (ELECTROSURGICAL) ×2
ELECTRODE REM PT RTRN 9FT ADLT (ELECTROSURGICAL) ×1 IMPLANT
GLOVE BIOGEL PI IND STRL 6.5 (GLOVE) IMPLANT
GLOVE BIOGEL PI IND STRL 9 (GLOVE) ×1 IMPLANT
GLOVE BIOGEL PI INDICATOR 6.5 (GLOVE) ×1
GLOVE BIOGEL PI INDICATOR 9 (GLOVE) ×1
GLOVE SURG ORTHO 9.0 STRL STRW (GLOVE) ×2 IMPLANT
GLOVE SURG SS PI 6.5 STRL IVOR (GLOVE) ×1 IMPLANT
GOWN PREVENTION PLUS XLARGE (GOWN DISPOSABLE) ×1 IMPLANT
GOWN SRG XL XLNG 56XLVL 4 (GOWN DISPOSABLE) ×1 IMPLANT
GOWN STRL NON-REIN LRG LVL3 (GOWN DISPOSABLE) ×2 IMPLANT
GOWN STRL NON-REIN XL XLG LVL4 (GOWN DISPOSABLE) ×2
KIT BASIN OR (CUSTOM PROCEDURE TRAY) ×2 IMPLANT
KIT ROOM TURNOVER OR (KITS) ×2 IMPLANT
MANIFOLD NEPTUNE II (INSTRUMENTS) ×1 IMPLANT
NS IRRIG 1000ML POUR BTL (IV SOLUTION) ×2 IMPLANT
PACK GENERAL/GYN (CUSTOM PROCEDURE TRAY) ×2 IMPLANT
PAD ARMBOARD 7.5X6 YLW CONV (MISCELLANEOUS) ×4 IMPLANT
SPONGE GAUZE 4X4 12PLY (GAUZE/BANDAGES/DRESSINGS) ×2 IMPLANT
SPONGE LAP 18X18 X RAY DECT (DISPOSABLE) ×1 IMPLANT
STAPLER VISISTAT 35W (STAPLE) IMPLANT
STOCKINETTE IMPERVIOUS LG (DRAPES) ×2 IMPLANT
SUT PDS AB 1 CT  36 (SUTURE) ×1
SUT PDS AB 1 CT 36 (SUTURE) IMPLANT
SUT SILK 2 0 (SUTURE) ×2
SUT SILK 2-0 18XBRD TIE 12 (SUTURE) ×1 IMPLANT
TOWEL OR 17X24 6PK STRL BLUE (TOWEL DISPOSABLE) ×2 IMPLANT
TOWEL OR 17X26 10 PK STRL BLUE (TOWEL DISPOSABLE) ×2 IMPLANT
TUBE ANAEROBIC SPECIMEN COL (MISCELLANEOUS) IMPLANT
WATER STERILE IRR 1000ML POUR (IV SOLUTION) ×2 IMPLANT

## 2013-10-14 NOTE — Progress Notes (Signed)
Patient is active with [facility] Care Management services. [facility] Community Care Coordinator relates patient expresses concerns about having depression after surgery. [facility] LCSW has been in the process of finding patient a psychiatrist as outpatient prior to this admission. [facility] Community Care Coordinator reports patient has stated when she goes home she would like to have an aide as well. Will continue to follow once patient is admitted to nursing unit. Will discuss with inpatient RNCM once patient is on the nursing floor.  Raiford Noble, MSN- Ed, RN,BSN- [facility] [phone]

## 2013-10-14 NOTE — Anesthesia Preprocedure Evaluation (Addendum)
Anesthesia Evaluation  Patient identified by MRN, date of birth, ID band Patient awake    Reviewed: Allergy & Precautions, H&P , NPO status , Patient's Chart, lab work & pertinent test results, reviewed documented beta blocker date and time   History of Anesthesia Complications Negative for: history of anesthetic complications  Airway Mallampati: II TM Distance: >3 FB Neck ROM: Full    Dental  (+) Edentulous Upper and Edentulous Lower   Pulmonary asthma , COPD COPD inhaler, Current Smoker,  breath sounds clear to auscultation  Pulmonary exam normal       Cardiovascular hypertension, Pt. on medications and Pt. on home beta blockers + angina (recent stress test NEG) + Peripheral Vascular Disease and +CHF Rhythm:Regular Rate:Normal  9/14 myoview: no ischemia, EF 65% 3/14 TEE: normal LVF, EF 60-65%, valves OK   Neuro/Psych    GI/Hepatic Neg liver ROS, GERD-  Medicated and Controlled,  Endo/Other  diabetes (glu 106), Type 2, Insulin Dependent and Oral Hypoglycemic AgentsMorbid obesity  Renal/GU negative Renal ROS     Musculoskeletal  (+) Arthritis -, Fibromyalgia -, narcotic dependent  Abdominal (+) + obese,   Peds  Hematology  (+) Blood dyscrasia (Hb 10.3), anemia ,   Anesthesia Other Findings   Reproductive/Obstetrics                           Anesthesia Physical Anesthesia Plan  ASA: III  Anesthesia Plan: General   Post-op Pain Management:    Induction: Intravenous  Airway Management Planned: LMA  Additional Equipment:   Intra-op Plan:   Post-operative Plan:   Informed Consent: I have reviewed the patients History and Physical, chart, labs and discussed the procedure including the risks, benefits and alternatives for the proposed anesthesia with the patient or authorized representative who has indicated his/her understanding and acceptance.     Plan Discussed with: CRNA, Surgeon  and Anesthesiologist  Anesthesia Plan Comments: (Plan routine monitors, GA- LMA OK)       Anesthesia Quick Evaluation

## 2013-10-14 NOTE — Op Note (Signed)
OPERATIVE REPORT  DATE OF SURGERY: 10/14/2013  PATIENT:  Jennifer [name],  52 y.o. female  PRE-OPERATIVE DIAGNOSIS:  Gangrene Left Foot  POST-OPERATIVE DIAGNOSIS:  Gangrene Left Foot  PROCEDURE:  Procedure(s): AMPUTATION BELOW KNEE- left  SURGEON:  Surgeon(s): Nadara Mustard, MD  ANESTHESIA:   general  EBL:  min ML  SPECIMEN:  Source of Specimen:  Left leg  TOURNIQUET:   Total Tourniquet Time Documented: Thigh (Left) - 14 minutes Total: Thigh (Left) - 14 minutes   PROCEDURE DETAILS: Patient is a 52 year old woman who presents after foot salvage surgery. Patient has uncontrolled diabetes smoking and has been full weightbearing on her foot has fallen her f and purulent drainage. Do to the wound breakdown and no further foot salvage intervention patient presents at this time for transtibial amputation. Risks and benefits were discussed including infection neurovascular injury nonhealing of the wound need for additional surgery. Patient states she understands and wished to proceed at this time. Description of procedure patient was brought to the operating room and underwent a general anesthetic. After adequate levels and anesthesia were obtained patient's left lower extremity was prepped using DuraPrep draped into a sterile field and an impervious stockinette was used to draped the foot out of the sterile field. A transverse incision was made 11 cm distal to the tibial tubercle this curved proximally and a large posterior flap was created. The tibia was resected and beveled just proximal to the skin incision and the fibula was transected just proximal to the tibial incision. A large posterior flap was created. The sciatic nerve was pulled cut and allowed to retract. The vascular bundles were suture ligated with 2-0 silk. The tourniquet was deflated hemostasis was obtained. The deep and superficial fascial layers were closed using #1 PDS. The skin was closed using 2-0 nylon and staples. The  wound is covered with Adaptic orthopedic sponges AB dressing Kerlix and Coban. Patient was extubated taken to the PACU in stable condition.oot 3 times. On examination patient has ischemic necrosis of the wound with breakdown  PLAN OF CARE: Admit to inpatient   PATIENT DISPOSITION:  PACU - hemodynamically stable.   Nadara Mustard, MD 10/14/2013 4:20 PM

## 2013-10-14 NOTE — Transfer of Care (Signed)
Immediate Anesthesia Transfer of Care Note  Patient: Jennifer [name]  Procedure(s) Performed: Procedure(s) with comments: AMPUTATION BELOW KNEE- left (Left) - Left Below Knee Amputation   Patient Location: PACU  Anesthesia Type:General  Level of Consciousness: awake, alert  and oriented  Airway & Oxygen Therapy: Patient Spontanous Breathing and Patient connected to face mask oxygen  Post-op Assessment: Report given to PACU RN  Post vital signs: Reviewed and stable  Complications: No apparent anesthesia complications

## 2013-10-14 NOTE — Progress Notes (Signed)
ANTIBIOTIC CONSULT NOTE - INITIAL  Pharmacy Consult for Coumadin Indication: Infected foot following surgery for midfoot amputation.  Allergies  Allergen Reactions  . Iohexol      Desc: IV CONTRAST CAUSE NEPHROPATHY IN 2007   . Ivp Dye [Iodinated Diagnostic Agents]   . Morphine Sulfate Itching and Rash    Patient Measurements: Height: 5\' 6"  (167.6 cm) Weight: 265 lb (120.203 kg) IBW/kg (Calculated) : 59.3 Adjusted Body Weight:   Vital Signs: Temp: 97.8 F (36.6 C) (10/24 1744) Temp src: Oral (10/24 1744) BP: 142/85 mmHg (10/24 1744) Pulse Rate: 75 (10/24 1744) Intake/Output from previous day:   Intake/Output from this shift:    Labs:  Recent Labs  10/14/13 1227  WBC 9.7  HGB 10.3*  PLT 277  CREATININE 0.80   Estimated Creatinine Clearance: 106.2 ml/min (by C-G formula based on Cr of 0.8). No results found for this basename: VANCOTROUGH, Leodis Binet, VANCORANDOM, GENTTROUGH, GENTPEAK, GENTRANDOM, TOBRATROUGH, TOBRAPEAK, TOBRARND, AMIKACINPEAK, AMIKACINTROU, AMIKACIN,  in the last 72 hours   Microbiology: Recent Results (from the past 720 hour(s))  CLOSTRIDIUM DIFFICILE BY PCR     Status: None   Collection Time    09/20/13  2:26 PM      Result Value Range Status   C difficile by pcr NEGATIVE  NEGATIVE Final    Medical History: Past Medical History  Diagnosis Date  . Depression   . GERD (gastroesophageal reflux disease)   . Hyperlipidemia   . Hypertension   . Glaucoma   . PVD (peripheral vascular disease) with claudication     Stents to bilateral common iliac arteries (left 2005, right 2008), on chronic plavix  . Hyperplastic colon polyp 12/2010    Per colonoscopy (12/2010) - Dr. Arlyce Dice  . Asthma   . COPD 01/08/2007    PFT's 05/2007 : FEV1/FVC 82, FEV1 64% pred, FEF 25-75% 40% predicted, 16% improvement in FEV1 with bronchodilators.     . Tobacco abuse   . Chronic osteomyelitis of foot     chronic, right secondary to diabetic foot ulcers  .  Polymicrobial bacterial infection 01/2013    GBS and S. aureus bacteremia // Source likely infected diabetic foot ulcer  . 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 Blood cultures 01/26/13 S. Aureus and GBS. Blood cultures Feb 6th, 8th, and 9th and March were negative.Repeat TEE 3/20 negative for vegitations  . 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 Chronic diastolic heart failure     grade 2 per 2D echocardiogram (01/2013)  . Ulcer of foot, chronic     Left. No OM per MRI (01/2013)  . DVT of upper extremity (deep vein thrombosis) 03/11/2013    Secondary to PICC line. Right brachial vein, diagnosed on 03/10/2013 Coumadin for 3 months. End date 06/10/2013   . Chronic pain syndrome 12/03/2011    Likely secondary to depression, "fibromyalgia", neuropathy, and obesity. Lumbar MRI 2014 no sig change from prior (2008) : Stable hypertrophic facet disease most notable at L4-5. Stable shallow left foraminal/extraforaminal disc protrusion at L4-5. No direct neural compression.      . Environmental allergies     Hx: of  . Fatty liver 2003    observed on ultrasound abdomen  . CHF (congestive heart failure)   . Anginal pain     '3' of 10 ischemia ruled out 9/9   . Fibromyalgia   . Chronic bronchitis     "I get it  alot" (09/28/2013)  . Exertional shortness of breath   . Rheumatoid arthritis   . Arthritis of lumbar spine   . Chronic lower back pain   . Diabetic peripheral neuropathy   . Type II diabetes mellitus with peripheral circulatory disorders, uncontrolled DX: 1993    Insulin dep. Poor control. Complicated by diabetic foot ulcer and diabetic eye disease.      Medications:  Scheduled:  . [START ON 10/15/2013] atenolol  100 mg Oral q morning - 10a  . baclofen  10 mg Oral TID  . benazepril  40 mg Oral Daily  . buPROPion  150 mg Oral BID  .  ceFAZolin (ANCEF) IV  2 g Intravenous Q6H  . coumadin book   Does not apply Once  .  diphenhydrAMINE  25 mg Oral Q6H  . furosemide  40 mg Oral BID  . HYDROmorphone      . [START ON 10/15/2013] insulin aspart  0-15 Units Subcutaneous TID WC  . [START ON 10/15/2013] insulin aspart  4 Units Subcutaneous TID WC  . insulin glargine  20 Units Subcutaneous QHS  . [START ON 10/15/2013] metFORMIN  1,000 mg Oral BID WC  . mometasone-formoterol  2 puff Inhalation BID  . oxyCODONE      . pantoprazole  40 mg Oral Daily  . pregabalin  200 mg Oral TID  . simvastatin  5 mg Oral q1800  . Travoprost (BAK Free)  1 drop Both Eyes QHS  . traZODone  100 mg Oral QHS  . [START ON 10/15/2013] warfarin   Does not apply Once  . Warfarin - Pharmacist Dosing Inpatient   Does not apply q1800   Assessment: 52 yr old female presents with an infected foot following a midfoot amputation. The pt continued to smoke and has uncontrolled DM. She is now back for a below the knee amputation. Pharmacy is to dose coumadin for VTE Prophylaxis.  Goal of Therapy:  INR 2-3  Plan:  Coumadin 7.5 mg tonight. Daily PT/INR Ordered coumadin booklet and video for pt.  Jennifer [name] 10/14/2013,7:19 PM

## 2013-10-14 NOTE — Preoperative (Signed)
Beta Blockers   Reason not to administer Beta Blockers:Not Applicable 

## 2013-10-14 NOTE — H&P (Signed)
Jennifer [name] is an 52 y.o. female.   Chief Complaint: Abscess ulceration left foot status post midfoot amputation.  HPI:  Patient is a 52 year old woman with uncontrolled diabetes and peripheral vascular disease. She underwent a midfoot amputation for foot salvage surgery. Postoperatively patient was full weightbearing on her foot she had fallen on her foot 3 times patient continue to smoke and continue with elevated blood sugars. Do to the trauma of weightbearing and falling and vascular compromise with diabetes and smoking patient has had progressive dehiscence of the wound.   Past Medical History  Diagnosis Date  . Depression   . GERD (gastroesophageal reflux disease)   . Hyperlipidemia   . Hypertension   . Glaucoma   . PVD (peripheral vascular disease) with claudication     Stents to bilateral common iliac arteries (left 2005, right 2008), on chronic plavix  . Diabetic peripheral neuropathy   . Hyperplastic colon polyp 12/2010    Per colonoscopy (12/2010) - Dr. Arlyce Dice  . Asthma   . COPD 01/08/2007    PFT's 05/2007 : FEV1/FVC 82, FEV1 64% pred, FEF 25-75% 40% predicted, 16% improvement in FEV1 with bronchodilators.     . Type II diabetes mellitus with peripheral circulatory disorders, uncontrolled (250.72) DX: 1993    Insulin dep. Poor control. Complicated by diabetic foot ulcer and diabetic eye disease.    . Tobacco abuse   . Chronic osteomyelitis of foot     chronic, right secondary to diabetic foot ulcers  . Polymicrobial bacterial infection 01/2013    GBS and S. aureus bacteremia // Source likely infected diabetic foot ulcer  . 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 Blood cultures 01/26/13 S. Aureus and GBS. Blood cultures Feb 6th, 8th, and 9th and March were negative.Repeat TEE 3/20 negative for vegitations  . 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 Chronic diastolic heart failure     grade 2 per 2D  echocardiogram (01/2013)  . Ulcer of foot, chronic     Left. No OM per MRI (01/2013)  . DVT of upper extremity (deep vein thrombosis) 03/11/2013    Secondary to PICC line. Right brachial vein, diagnosed on 03/10/2013 Coumadin for 3 months. End date 06/10/2013   . Chronic pain syndrome 12/03/2011    Likely secondary to depression, "fibromyalgia", neuropathy, and obesity. Lumbar MRI 2014 no sig change from prior (2008) : Stable hypertrophic facet disease most notable at L4-5. Stable shallow left foraminal/extraforaminal disc protrusion at L4-5. No direct neural compression.      . Environmental allergies     Hx: of  . Fatty liver 2003    observed on ultrasound abdomen  . CHF (congestive heart failure)   . Anginal pain     '3' of 10 ischemia ruled out 9/9   . Fibromyalgia   . Chronic bronchitis     "I get it alot" (09/28/2013)  . Exertional shortness of breath   . Rheumatoid arthritis   . Arthritis of lumbar spine   . Chronic lower back pain     Past Surgical History  Procedure Laterality Date  . Abdominal hysterectomy  1997    secondary to uterine fibroids  . Tubal ligation    . Shoulder arthroscopy w/ rotator cuff repair Bilateral   . Breast biopsy      multiple-benign per pt  . Wrist surgery Right     "for tumors" (09/28/2013)  . Ganglion cyst excision  multiple  . Other surgical history      stents in lower ext  . Tee without cardioversion N/A 01/31/2013    Procedure: TRANSESOPHAGEAL ECHOCARDIOGRAM (TEE);  Surgeon: Pricilla Riffle, MD;  Location: [facility] ENDOSCOPY;  Service: Cardiovascular;  Laterality: N/A;  Rm [phone]  . Bladder surgery      bladder reconstruction surgery  . Tee without cardioversion N/A 03/10/2013    Procedure: TRANSESOPHAGEAL ECHOCARDIOGRAM (TEE);  Surgeon: Laurey Morale, MD;  Location: [facility] ENDOSCOPY;  Service: Cardiovascular;  Laterality: N/A;  Rm. [phone]  . Skin split graft Bilateral 05/13/2013    Procedure: Right and Left Foot Allograft Skin Graft;  Surgeon:  Nadara Mustard, MD;  Location: [facility] OR;  Service: Orthopedics;  Laterality: Bilateral;  Right and Left Foot Allograft Skin Graft  . Toe amputation Left 08/31/2013    4TH & 5 TH TOE   . Amputation Left 08/31/2013    Procedure: AMPUTATION RAY;  Surgeon: Nadara Mustard, MD;  Location: [facility] OR;  Service: Orthopedics;  Laterality: Left;  Left Foot 5th Ray Amputation  . Esophagogastroduodenoscopy N/A 09/20/2013    Procedure: ESOPHAGOGASTRODUODENOSCOPY (EGD);  Surgeon: Beverley Fiedler, MD;  Location: [facility] ENDOSCOPY;  Service: Gastroenterology;  Laterality: N/A;  . Foot amputation through metatarsal Left 09/28/2013  . Tonsillectomy    . Amputation Left 09/28/2013    Procedure: Left Midfoot amputation;  Surgeon: Nadara Mustard, MD;  Location: [facility] OR;  Service: Orthopedics;  Laterality: Left;  Left Midfoot amputation    Family History  Problem Relation Age of Onset  . Diverticulosis Mother   . Diabetes Mother   . Hypertension Mother   . Congestive Heart Failure Mother   . Asthma Father   . CAD Sister 62    MI at age 52 per patient.  However, she has not had a stent or CABG.    Social History:  reports that she quit smoking 11 days ago. Her smoking use included Cigarettes. She has a 22 pack-year smoking history. She has never used smokeless tobacco. She reports that she does not drink alcohol or use illicit drugs.  Allergies:  Allergies  Allergen Reactions  . Iohexol      Desc: IV CONTRAST CAUSE NEPHROPATHY IN 2007   . Ivp Dye [Iodinated Diagnostic Agents]   . Morphine Sulfate Itching and Rash    No prescriptions prior to admission    No results found for this or any previous visit (from the past 48 hour(s)). No results found.  Review of Systems  All other systems reviewed and are negative.    There were no vitals taken for this visit. Physical Exam  On examination patient has dehiscence of the midfoot amputation. There is insufficient soft tissue or bony structure to provide for a foot for the  patient for attempt at foot salvage reconstruction. Assessment/Plan Assessment: Dehiscence flap amputation.  Plan: Discussed with the patient and her family the only option at this time would be a transtibial amputation. Risks of nonhealing of the wound risk of need for additional surgery were discussed patient and family state they understand and wish to proceed at this time.  Verner Kopischke V 10/14/2013, 6:25 AM

## 2013-10-15 LAB — PROTIME-INR
INR: 1.23 (ref 0.00–1.49)
Prothrombin Time: 15.2 seconds (ref 11.6–15.2)

## 2013-10-15 LAB — GLUCOSE, CAPILLARY
Glucose-Capillary: 132 mg/dL — ABNORMAL HIGH (ref 70–99)
Glucose-Capillary: 170 mg/dL — ABNORMAL HIGH (ref 70–99)
Glucose-Capillary: 359 mg/dL — ABNORMAL HIGH (ref 70–99)
Glucose-Capillary: 287 mg/dL — ABNORMAL HIGH (ref 70–99)

## 2013-10-15 LAB — HEMOGLOBIN A1C
Hgb A1c MFr Bld: 8.6 % — ABNORMAL HIGH (ref <5.7)
Mean Plasma Glucose: 200 mg/dL — ABNORMAL HIGH (ref <117)

## 2013-10-15 MED ORDER — WARFARIN SODIUM 7.5 MG PO TABS
7.5000 mg | ORAL_TABLET | Freq: Once | ORAL | Status: AC
  Administered 2013-10-15: 7.5 mg via ORAL
  Filled 2013-10-15: qty 1

## 2013-10-15 NOTE — Progress Notes (Signed)
ANTICOAGULATION CONSULT NOTE - Follow Up Consult  Pharmacy Consult for Coumadin Indication: VTE prophylaxis  Allergies  Allergen Reactions  . Iohexol      Desc: IV CONTRAST CAUSE NEPHROPATHY IN 2007   . Ivp Dye [Iodinated Diagnostic Agents]   . Morphine Sulfate Itching and Rash    Patient Measurements: Height: 5\' 6"  (167.6 cm) Weight: 265 lb (120.203 kg) IBW/kg (Calculated) : 59.3  Vital Signs:    Labs:  Recent Labs  10/14/13 1227 10/15/13 0548  HGB 10.3*  --   HCT 31.9*  --   PLT 277  --   LABPROT  --  15.2  INR  --  1.23  CREATININE 0.80  --     Estimated Creatinine Clearance: 106.2 ml/min (by C-G formula based on Cr of 0.8).   Medications:  Scheduled:  . atenolol  100 mg Oral q morning - 10a  . baclofen  10 mg Oral TID  . benazepril  40 mg Oral Daily  . buPROPion  150 mg Oral BID  . coumadin book   Does not apply Once  . diphenhydrAMINE  25 mg Oral Q6H  . furosemide  40 mg Oral BID  . insulin aspart  0-15 Units Subcutaneous TID WC  . insulin aspart  4 Units Subcutaneous TID WC  . insulin glargine  20 Units Subcutaneous QHS  . mometasone-formoterol  2 puff Inhalation BID  . pantoprazole  40 mg Oral Daily  . pregabalin  200 mg Oral TID  . simvastatin  5 mg Oral q1800  . Travoprost (BAK Free)  1 drop Both Eyes QHS  . traZODone  100 mg Oral QHS  . warfarin   Does not apply Once  . Warfarin - Pharmacist Dosing Inpatient   Does not apply q1800    Assessment: 52 yo F presents with an infected foot on previous admission, now s/p midfoot amputation. 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 Pharmacy is to dose coumadin for VTE Prophylaxis.  After one dose of 7.5mg  last night, the INR is reported subtherapeutic today at 1.23 as expected.  Will repeat dose and assess INR trend in the morning.  Goal of Therapy:  INR 2-3 Monitor platelets by anticoagulation protocol: Yes   Plan:  - give  Coumadin 7.5mg  PO tonight and f/u morning INR - daily INR and CBC - monitor for signs and symptoms of bleeding  Harrold Donath E. Achilles Dunk, PharmD Clinical Pharmacist - Resident Pager: [phone] Pharmacy: [phone] 10/15/2013 2:54 PM

## 2013-10-15 NOTE — Progress Notes (Signed)
   CARE MANAGEMENT NOTE 10/15/2013  Patient:  Fee,Ioma L   Account Number:  [id number]  Date Initiated:  10/15/2013  Documentation initiated by:  Alenna Russell  Subjective/Objective Assessment:   adm: Gangrene Left Foot  AMPUTATION BELOW KNEE- left     Action/Plan:   discharge planning   Anticipated DC Date:  10/18/2013   Anticipated DC Plan:  SKILLED NURSING FACILITY  In-house referral  Clinical Social Worker      DC Planning Services  CM consult      Choice offered to / List presented to:             Status of service:  In process, will continue to follow Medicare Important Message given?   (If response is "NO", the following Medicare IM given date fields will be blank) Date Medicare IM given:   Date Additional Medicare IM given:    Discharge Disposition:    Per UR Regulation:    If discussed at Long Length of Stay Meetings, dates discussed:    Comments:  10/15/13 16:20 CM notes SNF recc by PT.  CSW notified. Will continue to monitor for CM needs.  Monigue Spraggins, BSN, CM [phone].  

## 2013-10-15 NOTE — Progress Notes (Signed)
10/15/13 1546  PT Visit Information  Last PT Received On 10/15/13  Assistance Needed +2  History of Present Illness Pt underwent left BKA.    PT Time Calculation  PT Start Time 1225  PT Stop Time 1301  PT Time Calculation (min) 36 min  Subjective Data  Subjective "I need to get off this toilet."  Precautions  Precautions Fall  Restrictions  Weight Bearing Restrictions No  Cognition  Arousal/Alertness Awake/alert  Behavior During Therapy WFL for tasks assessed/performed  Overall Cognitive Status Within Functional Limits for tasks assessed  Bed Mobility  Bed Mobility Sit to Supine;Scooting to HOB  Supine to Sit Not tested (comment)  Sitting - Scoot to Edge of Bed Not tested (comment)  Sit to Supine 1: +2 Total assist;HOB flat  Sit to Supine: Patient Percentage 40%  Scooting to HOB 1: +2 Total assist;With rail  Scooting to [facility]: Patient Percentage 80%  Details for Bed Mobility Assistance Pt needed assist for bil LES.  Pt anxious making her require more assist.    Transfers  Transfers Sit to Stand;Stand to Sit;Stand Pivot Transfers  Sit to Stand 1: +2 Total assist;With upper extremity assist;From bed  Sit to Stand: Patient Percentage 40%  Stand to Sit 1: +2 Total assist;With upper extremity assist;With armrests;To chair/3-in-1  Stand to Sit: Patient Percentage 50%  Stand Pivot Transfers 1: +2 Total assist  Stand Pivot Transfers: Patient Percentage 50%  Details for Transfer Assistance Pt stood to RW with cues for hand placement.  Pt had more difficulty with transfer secondary to fatigue and transfer to left side with RW.  Pt had > difficulty maintaining balance on the one LE.   Continued to  flex her trunk as well.  Pivoted around on foot to get back on bed.  Ambulation/Gait  Ambulation/Gait Assistance Not tested (comment)  Stairs No  Wheelchair Mobility  Wheelchair Mobility No  Dynamic Standing Balance  Dynamic Standing - Balance Support Bilateral upper extremity  supported;During functional activity  Dynamic Standing - Level of Assistance 1: +2 Total assist;Patient percentage (comment) (pt =50%)  Dynamic Standing - Comments Pt with decr safety with transfer secondary to fatigue and pt distracted by pain.  Again poor safety awareness as pt does not follow cues for safety and does what she wants instead of following cues for safety.    Exercises  Exercises Amputee  Amputee Exercises  Quad Sets AROM;Both;10 reps;Supine  Hip ABduction/ADduction AROM;Both;10 reps;Supine  PT - End of Session  Equipment Utilized During Treatment Gait belt;Oxygen  Activity Tolerance Patient limited by fatigue;Patient limited by pain  Patient left in bed;with call bell/phone within reach;with bed alarm set;with family/visitor present  Nurse Communication Mobility status;Need for lift equipment  PT - Assessment/Plan  PT Plan Current plan remains appropriate  PT Frequency Min 3X/week  Follow Up Recommendations SNF;Supervision/Assistance - 24 hour  PT equipment Other (comment);Hospital bed (wide drop arm potty, electric amputee wheelchair, hoyer lift)  PT Goal Progression  Progress towards PT goals Progressing toward goals  PT General Charges  $$ ACUTE PT VISIT 1 Procedure  PT Treatments  $Therapeutic Activity 8-22 mins  $Self Care/Home Management 8-22  [facility] [phone] (pager)

## 2013-10-15 NOTE — Progress Notes (Signed)
   Subjective:  Patient reports pain as severe.    Objective:   VITALS:   Filed Vitals:   10/14/13 1744 10/14/13 2055 10/14/13 2137 10/15/13 1535  BP: 142/85  140/86 132/83  Pulse: 75  84 75  Temp: 97.8 F (36.6 C)  98.7 F (37.1 C) 98.2 F (36.8 C)  TempSrc: Oral   Oral  Resp:   20 18  Height:      Weight:      SpO2: 97% 97% 99% 96%    Incision: dressing C/D/I and no drainage   Lab Results  Component Value Date   WBC 9.7 10/14/2013   HGB 10.3* 10/14/2013   HCT 31.9* 10/14/2013   MCV 93.0 10/14/2013   PLT 277 10/14/2013     Assessment/Plan: 1 Day Post-Op   Problem List Items Addressed This Visit   None      Advance diet Up with therapy NWB LLE DVT ppx - coumadin Pain control Discharge planning   Cheral Almas 10/15/2013, 8:42 PM [phone]

## 2013-10-15 NOTE — Progress Notes (Signed)
OT Cancellation Note  Patient Details Name: Jennifer [name] MRN: [id number] DOB: [birth year]   Cancelled Treatment:     PT has tried multiple times to work with pt today and have been unable, spoke with PT who reports that pt not really going anywhere fast, will await and eval pt on Monday as appropriate.  Evette Georges [phone] 10/15/2013, 2:30 PM

## 2013-10-15 NOTE — Progress Notes (Deleted)
   CARE MANAGEMENT NOTE 10/15/2013  Patient:  ARLETTA, LUMADUE   Account Number:  [id number]  Date Initiated:  10/15/2013  Documentation initiated by:  [facility]  Subjective/Objective Assessment:   adm: Gangrene Left Foot  AMPUTATION BELOW KNEE- left     Action/Plan:   discharge planning   Anticipated DC Date:  10/18/2013   Anticipated DC Plan:  SKILLED NURSING FACILITY  In-house referral  Clinical Social Worker      DC Planning Services  CM consult      Choice offered to / List presented to:             Status of service:  In process, will continue to follow Medicare Important Message given?   (If response is "NO", the following Medicare IM given date fields will be blank) Date Medicare IM given:   Date Additional Medicare IM given:    Discharge Disposition:    Per UR Regulation:    If discussed at Long Length of Stay Meetings, dates discussed:    Comments:  10/15/13 16:20 CM notes SNF recc by PT.  CSW notified. Will continue to monitor for CM needs.  Freddy Jaksch, BSN, CM [phone].

## 2013-10-15 NOTE — Progress Notes (Signed)
Physical Therapy Evaluation Patient Details Name: Jennifer [name] MRN: [id number] DOB: 22-Oct-1961 Today's Date: 10/15/2013 Time: 0350-0938 PT Time Calculation (min): 16 min  PT Assessment / Plan / Recommendation History of Present Illness  Pt underwent left BKA.    Clinical Impression  Pt admitted with left BKA. Pt currently with functional limitations due to the deficits listed below (see PT Problem List). Pt will need SNF for therapy as she will need extensive education after left BKA.  Does not have anyone that can stay with her 24 hours and even if she did at current level would need a hoyer lift and incr equipment.   Pt will benefit from skilled PT to increase their independence and safety with mobility to allow discharge to the venue listed below.     PT Assessment  Patient needs continued PT services    Follow Up Recommendations  SNF;Supervision/Assistance - 24 hour        Barriers to Discharge Decreased caregiver support still has no 24 hour assist and now an amputee    Equipment Recommendations  Other (comment);Hospital bed (wide drop arm potty, electric amputee wheelchair, hoyer lift, sliding board)         Frequency Min 3X/week    Precautions / Restrictions Precautions Precautions: Fall Restrictions Weight Bearing Restrictions: No   Pertinent Vitals/Pain VSS, left LE phantom pain per pt.  Pt aware of desensitization techniques.       Mobility  Bed Mobility Bed Mobility: Supine to Sit;Sitting - Scoot to Edge of Bed Supine to Sit: 3: Mod assist;HOB elevated;With rails Sitting - Scoot to Edge of Bed: 2: Max assist Details for Bed Mobility Assistance: Pt needed assist secondary to left LE pain.  Pt gets distracted easily.  +phantom pain per pt.  Transfers Transfers: Sit to Stand;Stand to Sit;Stand Pivot Transfers Sit to Stand: 1: +2 Total assist;With upper extremity assist;From bed Sit to Stand: Patient Percentage: 40% Stand to Sit: 1: +2 Total assist;With upper  extremity assist;With armrests;To chair/3-in-1 Stand to Sit: Patient Percentage: 50% Stand Pivot Transfers: 1: +2 Total assist Stand Pivot Transfers: Patient Percentage: 60% Details for Transfer Assistance: Pt stood to RW with cues for hand placement.  Pt did not have a lot of difficulty with transfer to right side with RW as long as constantly cued to stand upright as pt likes to flex her trunk.  Pivoted around on foot to get on 3N1. Ambulation/Gait Ambulation/Gait Assistance: Not tested (comment) Stairs: No Wheelchair Mobility Wheelchair Mobility: No         PT Diagnosis: Generalized weakness;Acute pain  PT Problem List: Decreased activity tolerance;Decreased balance;Decreased mobility;Decreased knowledge of use of DME;Decreased knowledge of precautions;Pain PT Treatment Interventions: DME instruction;Gait training;Functional mobility training;Therapeutic activities;Therapeutic exercise;Balance training;Patient/family education     PT Goals(Current goals can be found in the care plan section) Acute Rehab PT Goals Patient Stated Goal: wants to ultimately return to caring for herself  in home enviornment PT Goal Formulation: With patient Time For Goal Achievement: 10/29/13 Potential to Achieve Goals: Good  Visit Information  Last PT Received On: 10/15/13 Assistance Needed: +2 History of Present Illness: Pt underwent left BKA.         Prior Function Home Living Family/patient expects to be discharged to:: Private residence Living Arrangements: Children (son) Available Help at Discharge: Family;Available PRN/intermittently (son works now and is home intermittently) Type of Home: Apartment Home Access: Level entry (on back of apartment) Entrance Stairs-Rails: Left Home Layout: One level Home Equipment: Wheelchair - [employer] -  2 wheels;Bedside commode;Walker - 4 wheels (states her 3N1 is too small) Additional Comments: pt reports she can go around to backdoor and does not  have any steps  Prior Function Level of Independence: Independent with assistive device(s) Communication Communication: No difficulties Dominant Hand: Right    Cognition  Cognition Arousal/Alertness: Awake/alert Behavior During Therapy: WFL for tasks assessed/performed Overall Cognitive Status: Within Functional Limits for tasks assessed    Extremity/Trunk Assessment Upper Extremity Assessment Upper Extremity Assessment: Defer to OT evaluation Lower Extremity Assessment Lower Extremity Assessment: LLE deficits/detail;RLE deficits/detail RLE Deficits / Details: grossly 3/5 LLE: Unable to fully assess due to pain LLE Sensation:  (incr sensitivity)   Balance Dynamic Standing Balance Dynamic Standing - Balance Support: Bilateral upper extremity supported;During functional activity Dynamic Standing - Level of Assistance: 1: +2 Total assist;Patient percentage (comment) (pt =50%) Dynamic Standing - Comments: Pt with fair safety with RW in standing but needing a lot of support given that pt only with 1 LE and body habitus somewhat large.  Has poor safety awareness as pt does not follow cues for safety and does what she wants.    End of Session PT - End of Session Equipment Utilized During Treatment: Gait belt;Oxygen Activity Tolerance: Patient limited by fatigue;Patient limited by pain Patient left: in chair;with call bell/phone within reach;with nursing/sitter in room (on 3N1) Nurse Communication: Mobility status;Need for lift equipment       INGOLD,Patrizia Paule 10/15/2013, 3:41 PM [facility] Acute Rehabilitation [phone] (pager)

## 2013-10-16 DIAGNOSIS — S88119A Complete traumatic amputation at level between knee and ankle, unspecified lower leg, initial encounter: Secondary | ICD-10-CM

## 2013-10-16 LAB — CBC
HCT: 28.1 % — ABNORMAL LOW (ref 36.0–46.0)
Hemoglobin: 9 g/dL — ABNORMAL LOW (ref 12.0–15.0)
MCH: 29.7 pg (ref 26.0–34.0)
MCHC: 32 g/dL (ref 30.0–36.0)
MCV: 92.7 fL (ref 78.0–100.0)
Platelets: 252 10*3/uL (ref 150–400)
RBC: 3.03 MIL/uL — ABNORMAL LOW (ref 3.87–5.11)
RDW: 14.9 % (ref 11.5–15.5)
WBC: 8.3 10*3/uL (ref 4.0–10.5)

## 2013-10-16 LAB — GLUCOSE, CAPILLARY
Glucose-Capillary: 217 mg/dL — ABNORMAL HIGH (ref 70–99)
Glucose-Capillary: 277 mg/dL — ABNORMAL HIGH (ref 70–99)
Glucose-Capillary: 257 mg/dL — ABNORMAL HIGH (ref 70–99)
Glucose-Capillary: 232 mg/dL — ABNORMAL HIGH (ref 70–99)

## 2013-10-16 LAB — PROTIME-INR
INR: 1.54 — ABNORMAL HIGH (ref 0.00–1.49)
Prothrombin Time: 18.1 seconds — ABNORMAL HIGH (ref 11.6–15.2)

## 2013-10-16 MED ORDER — WARFARIN SODIUM 6 MG PO TABS
6.0000 mg | ORAL_TABLET | Freq: Once | ORAL | Status: AC
  Administered 2013-10-16: 6 mg via ORAL
  Filled 2013-10-16: qty 1

## 2013-10-16 MED ORDER — OXYCODONE HCL 5 MG PO TABS
5.0000 mg | ORAL_TABLET | ORAL | Status: DC | PRN
  Administered 2013-10-16 – 2013-10-17 (×5): 15 mg via ORAL
  Filled 2013-10-16 (×5): qty 3

## 2013-10-16 NOTE — Discharge Instructions (Signed)
Information on my medicine - Coumadin   (Warfarin)  This medication education was reviewed with me or my healthcare representative as part of my discharge preparation.  The pharmacist that spoke with me during my hospital stay was:  Kaylyn Layer, [facility]  Why was Coumadin prescribed for you? Coumadin was prescribed for you because you have a blood clot or a medical condition that can cause an increased risk of forming blood clots. Blood clots can cause serious health problems by blocking the flow of blood to the heart, lung, or brain. Coumadin can prevent harmful blood clots from forming. As a reminder your indication for Coumadin is:   Blood Clot Prevention After Orthopedic Surgery  What test will check on my response to Coumadin? While on Coumadin (warfarin) you will need to have an INR test regularly to ensure that your dose is keeping you in the desired range. The INR (international normalized ratio) number is calculated from the result of the laboratory test called prothrombin time (PT).  If an INR APPOINTMENT HAS NOT ALREADY BEEN MADE FOR YOU please schedule an appointment to have this lab work done by your health care provider within 7 days. Your INR goal is usually a number between:  2 to 3 or your provider may give you a more narrow range like 2-2.5.  Ask your health care provider during an office visit what your goal INR is.  What  do you need to  know  About  COUMADIN? Take Coumadin (warfarin) exactly as prescribed by your healthcare provider about the same time each day.  DO NOT stop taking without talking to the doctor who prescribed the medication.  Stopping without other blood clot prevention medication to take the place of Coumadin may increase your risk of developing a new clot or stroke.  Get refills before you run out.  What do you do if you miss a dose? If you miss a dose, take it as soon as you remember on the same day then continue your regularly scheduled regimen the next day.   Do not take two doses of Coumadin at the same time.  Important Safety Information A possible side effect of Coumadin (Warfarin) is an increased risk of bleeding. You should call your healthcare provider right away if you experience any of the following:   Bleeding from an injury or your nose that does not stop.   Unusual colored urine (red or dark brown) or unusual colored stools (red or black).   Unusual bruising for unknown reasons.   A serious fall or if you hit your head (even if there is no bleeding).  Some foods or medicines interact with Coumadin (warfarin) and might alter your response to warfarin. To help avoid this:   Eat a balanced diet, maintaining a consistent amount of Vitamin K.   Notify your provider about major diet changes you plan to make.   Avoid alcohol or limit your intake to 1 drink for women and 2 drinks for men per day. (1 drink is 5 oz. wine, 12 oz. beer, or 1.5 oz. liquor.)  Make sure that ANY health care provider who prescribes medication for you knows that you are taking Coumadin (warfarin).  Also make sure the healthcare provider who is monitoring your Coumadin knows when you have started a new medication including herbals and non-prescription products.  Coumadin (Warfarin)  Major Drug Interactions  Increased Warfarin Effect Decreased Warfarin Effect  Alcohol (large quantities) Antibiotics (esp. Septra/Bactrim, Flagyl, Cipro) Amiodarone (Cordarone) Aspirin (  ASA) Cimetidine (Tagamet) Megestrol (Megace) NSAIDs (ibuprofen, naproxen, etc.) Piroxicam (Feldene) Propafenone (Rythmol SR) Propranolol (Inderal) Isoniazid (INH) Posaconazole (Noxafil) Barbiturates (Phenobarbital) Carbamazepine (Tegretol) Chlordiazepoxide (Librium) Cholestyramine (Questran) Griseofulvin Oral Contraceptives Rifampin Sucralfate (Carafate) Vitamin K   Coumadin (Warfarin) Major Herbal Interactions  Increased Warfarin Effect Decreased Warfarin Effect  Garlic Ginseng Ginkgo  biloba Coenzyme Q10 Green tea St. John's wort    Coumadin (Warfarin) FOOD Interactions  Eat a consistent number of servings per week of foods HIGH in Vitamin K (1 serving =  cup)  Collards (cooked, or boiled & drained) Kale (cooked, or boiled & drained) Mustard greens (cooked, or boiled & drained) Parsley *serving size only =  cup Spinach (cooked, or boiled & drained) Swiss chard (cooked, or boiled & drained) Turnip greens (cooked, or boiled & drained)  Eat a consistent number of servings per week of foods MEDIUM-HIGH in Vitamin K (1 serving = 1 cup)  Asparagus (cooked, or boiled & drained) Broccoli (cooked, boiled & drained, or raw & chopped) Brussel sprouts (cooked, or boiled & drained) *serving size only =  cup Lettuce, raw (green leaf, endive, romaine) Spinach, raw Turnip greens, raw & chopped   These websites have more information on Coumadin (warfarin):  http://www.king-russell.com/; https://www.hines.net/;

## 2013-10-16 NOTE — Progress Notes (Signed)
ANTICOAGULATION CONSULT NOTE - Follow Up Consult  Pharmacy Consult for Coumadin Indication: VTE prophylaxis  Allergies  Allergen Reactions  . Iohexol      Desc: IV CONTRAST CAUSE NEPHROPATHY IN 2007   . Ivp Dye [Iodinated Diagnostic Agents]   . Morphine Sulfate Itching and Rash    Patient Measurements: Height: 5\' 6"  (167.6 cm) Weight: 265 lb (120.203 kg) IBW/kg (Calculated) : 59.3  Vital Signs: Temp: 97.9 F (36.6 C) (10/26 0548) Temp src: Oral (10/26 0548) BP: 129/65 mmHg (10/26 0548) Pulse Rate: 78 (10/26 0548)  Labs:  Recent Labs  10/14/13 1227 10/15/13 0548 10/16/13 0615  HGB 10.3*  --  9.0*  HCT 31.9*  --  28.1*  PLT 277  --  252  LABPROT  --  15.2 18.1*  INR  --  1.23 1.54*  CREATININE 0.80  --   --     Estimated Creatinine Clearance: 106.2 ml/min (by C-G formula based on Cr of 0.8).   Medications:  Scheduled:  . atenolol  100 mg Oral q morning - 10a  . baclofen  10 mg Oral TID  . benazepril  40 mg Oral Daily  . buPROPion  150 mg Oral BID  . coumadin book   Does not apply Once  . diphenhydrAMINE  25 mg Oral Q6H  . furosemide  40 mg Oral BID  . insulin aspart  0-15 Units Subcutaneous TID WC  . insulin aspart  4 Units Subcutaneous TID WC  . insulin glargine  20 Units Subcutaneous QHS  . mometasone-formoterol  2 puff Inhalation BID  . pantoprazole  40 mg Oral Daily  . pregabalin  200 mg Oral TID  . simvastatin  5 mg Oral q1800  . Travoprost (BAK Free)  1 drop Both Eyes QHS  . traZODone  100 mg Oral QHS  . warfarin   Does not apply Once  . Warfarin - Pharmacist Dosing Inpatient   Does not apply q1800    Assessment: 52 yo F presents with an infected foot on previous admission, now s/p midfoot amputation. 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 Pharmacy is to dose coumadin for VTE Prophylaxis.  After two doses of 7.5mg  daily, the INR is reported subtherapeutic today at 1.54, a  nice steady trend from the previous values.  As we approach therapeutic range, will repeat a slightly lower dose and assess INR trend in the morning.  Goal of Therapy:  INR 2-3 Monitor platelets by anticoagulation protocol: Yes   Plan:  - give Coumadin 6mg  PO tonight and f/u morning INR - daily INR and CBC - monitor for signs and symptoms of bleeding  Harrold Donath E. Achilles Dunk, PharmD Clinical Pharmacist - Resident Pager: [phone] Pharmacy: [phone] 10/16/2013 1:11 PM

## 2013-10-16 NOTE — Progress Notes (Signed)
   Subjective:  Patient reports pain as severe.    Objective:   VITALS:   Filed Vitals:   10/15/13 1535 10/15/13 2138 10/16/13 0100 10/16/13 0548  BP: 132/83 141/120 162/65 129/65  Pulse: 75 84 87 78  Temp: 98.2 F (36.8 C) 98.7 F (37.1 C)  97.9 F (36.6 C)  TempSrc: Oral Oral  Oral  Resp: 18 20  18   Height:      Weight:      SpO2: 96% 98%  97%    Incision: dressing C/D/I and no drainage   Lab Results  Component Value Date   WBC 8.3 10/16/2013   HGB 9.0* 10/16/2013   HCT 28.1* 10/16/2013   MCV 92.7 10/16/2013   PLT 252 10/16/2013     Assessment/Plan: 2 Days Post-Op   Problem List Items Addressed This Visit   None      Advance diet Up with therapy RUE shaking - monitor, robaxin NWB LLE DVT ppx - coumadin Pain control Discharge planning   Cheral Almas 10/16/2013, 10:38 AM [phone]

## 2013-10-16 NOTE — Progress Notes (Signed)
Clinical Social Work Department BRIEF PSYCHOSOCIAL ASSESSMENT 10/16/2013  Patient:  Jennifer [name], Jennifer [name]     Account Number:  [id number]     Admit date:  10/14/2013  Clinical Social Worker:  Hendricks Milo  Date/Time:  10/16/2013 03:34 PM  Referred by:  Physician  Date Referred:  10/16/2013 Referred for  SNF Placement   Other Referral:   Interview type:  Patient Other interview type:    PSYCHOSOCIAL DATA Living Status:  WITH ADULT CHILDREN Admitted from facility:   Level of care:   Primary support name:  Amee Boothe Primary support relationship to patient:  CHILD, ADULT Degree of support available:   Very supportive lives with his mother.    CURRENT CONCERNS  Other Concerns:    SOCIAL WORK ASSESSMENT / PLAN Clinical Social Worker (CSW) met with patient to discuss SNF placement. Patient agreeable to SNF search in [facility] excluding [facility].   Assessment/plan status:  Psychosocial Support/Ongoing Assessment of Needs Other assessment/ plan:   Information/referral to community resources:   CSW gave patient SNF list.    PATIENT'S/FAMILY'S RESPONSE TO PLAN OF CARE: Patient was appreciative of CSW visit.

## 2013-10-16 NOTE — Progress Notes (Signed)
Orthopedic Tech Progress Note Patient Details:  Jennifer [name] [birth year] [id number]  [name], female   DOB: September 30, 1961, 52 y.o.   MRN: [id number] Trapeze bar patient helper  Jennifer [name] 10/16/2013, 2:03 PM

## 2013-10-16 NOTE — Progress Notes (Signed)
Clinical Social Work Department CLINICAL SOCIAL WORK PLACEMENT NOTE 10/16/2013  Patient:  LINETH, THIELKE  Account Number:  [id number] Admit date:  10/14/2013  Clinical Social Worker:  Jetta Lout, Theresia Majors  Date/time:  10/16/2013 03:38 PM  Clinical Social Work is seeking post-discharge placement for this patient at the following level of care:   SKILLED NURSING   (*CSW will update this form in Epic as items are completed)   10/16/2013  Patient/family provided with [facility] Department of Clinical Social Work's list of facilities offering this level of care within the geographic area requested by the patient (or if unable, by the patient's family).  10/16/2013  Patient/family informed of their freedom to choose among providers that offer the needed level of care, that participate in Medicare, Medicaid or managed care program needed by the patient, have an available bed and are willing to accept the patient.  10/16/2013  Patient/family informed of [facility]' ownership interest in [facility], as well as of the fact that they are under no obligation to receive care at this facility.  PASARR submitted to EDS on 10/16/2013 PASARR number received from [employer] on   FL2 transmitted to all facilities in geographic area requested by pt/family on  10/16/2013 FL2 transmitted to all facilities within larger geographic area on 10/16/2013  Patient informed that his/her managed care company has contracts with or will negotiate with  certain facilities, including the following:     Patient/family informed of bed offers received:   Patient chooses bed at  Physician recommends and patient chooses bed at    Patient to be transferred to  on   Patient to be transferred to facility by   The following physician request were entered in Epic:   Additional Comments: CSW submitted PASARR but did not receive it. Patient was agreeable to [facility] search but she does not want  [facility].

## 2013-10-17 ENCOUNTER — Telehealth: Payer: Self-pay | Admitting: *Deleted

## 2013-10-17 ENCOUNTER — Encounter (HOSPITAL_COMMUNITY): Payer: Self-pay | Admitting: Orthopedic Surgery

## 2013-10-17 LAB — GLUCOSE, CAPILLARY
Glucose-Capillary: 266 mg/dL — ABNORMAL HIGH (ref 70–99)
Glucose-Capillary: 309 mg/dL — ABNORMAL HIGH (ref 70–99)

## 2013-10-17 LAB — CBC
HCT: 26.5 % — ABNORMAL LOW (ref 36.0–46.0)
Hemoglobin: 8.5 g/dL — ABNORMAL LOW (ref 12.0–15.0)
MCH: 29.9 pg (ref 26.0–34.0)
MCHC: 32.1 g/dL (ref 30.0–36.0)
MCV: 93.3 fL (ref 78.0–100.0)
Platelets: 228 10*3/uL (ref 150–400)
RBC: 2.84 MIL/uL — ABNORMAL LOW (ref 3.87–5.11)
RDW: 15.1 % (ref 11.5–15.5)
WBC: 8.8 10*3/uL (ref 4.0–10.5)

## 2013-10-17 LAB — PROTIME-INR
INR: 2.22 — ABNORMAL HIGH (ref 0.00–1.49)
Prothrombin Time: 23.9 seconds — ABNORMAL HIGH (ref 11.6–15.2)

## 2013-10-17 MED ORDER — DIPHENHYDRAMINE HCL 25 MG PO CAPS
25.0000 mg | ORAL_CAPSULE | Freq: Four times a day (QID) | ORAL | Status: DC | PRN
  Administered 2013-10-17: 25 mg via ORAL

## 2013-10-17 MED ORDER — OXYCODONE-ACETAMINOPHEN 5-325 MG PO TABS: 1.0000 | ORAL_TABLET | ORAL | Status: DC | PRN

## 2013-10-17 MED ORDER — WARFARIN SODIUM 1 MG PO TABS: 1.0000 mg | ORAL_TABLET | Freq: Every day | ORAL | Status: DC

## 2013-10-17 NOTE — Telephone Encounter (Signed)
Rx not needed as she is being D/C'd to SNF today. And if I remember right, she uses vials not pens.

## 2013-10-17 NOTE — Progress Notes (Signed)
Rn tried to call report to [employer] 3 times. Was placed on hold for more than 10 minutes each time. Therefore, no report was given.

## 2013-10-17 NOTE — Progress Notes (Signed)
Physical Therapy Treatment Patient Details Name: Jennifer [name] MRN: [id number] DOB: February 08, 1961 Today's Date: 10/17/2013 Time: 4098-1191 PT Time Calculation (min): 20 min  PT Assessment / Plan / Recommendation  History of Present Illness Pt underwent left BKA.     PT Comments   Pt in a significant amount of pain and reports phantom pain as well. Pain medication received prior to session beginning but pt reports it has not helped. Spoke with nursing regarding next dose and relayed information to the patient. She refuses any activity due to pain, but pt education performed by therapist. Pt had many questions about plan of care and therapist explained benefits of participating in PT session as well as how it related to her pain level. Will continue to follow, may d/c to SNF this afternoon.  Follow Up Recommendations  SNF;Supervision/Assistance - 24 hour     Does the patient have the potential to tolerate intense rehabilitation     Barriers to Discharge        Equipment Recommendations  Other (comment);Hospital bed (wide drop arm potty, electric amputee wheelchair, hoyer lift)    Recommendations for Other Services    Frequency Min 3X/week   Progress towards PT Goals Progress towards PT goals: Progressing toward goals  Plan Current plan remains appropriate    Precautions / Restrictions Precautions Precautions: Fall Restrictions Weight Bearing Restrictions: Yes LLE Weight Bearing: Non weight bearing   Pertinent Vitals/Pain 10/10; nursing notified.    Mobility  Bed Mobility Bed Mobility: Not assessed Transfers Transfers: Not assessed Ambulation/Gait Ambulation/Gait Assistance: Not tested (comment)    Exercises     PT Diagnosis:    PT Problem List:   PT Treatment Interventions:     PT Goals (current goals can now be found in the care plan section) Acute Rehab PT Goals Patient Stated Goal: wants to ultimately return to caring for herself  in home enviornment PT Goal  Formulation: With patient Time For Goal Achievement: 10/29/13 Potential to Achieve Goals: Good  Visit Information  Last PT Received On: 10/17/13 Assistance Needed: +2 History of Present Illness: Pt underwent left BKA.      Subjective Data  Subjective: "I am in so much pain I can't stand it." Patient Stated Goal: wants to ultimately return to caring for herself  in home enviornment   Cognition  Cognition Arousal/Alertness: Awake/alert Behavior During Therapy: WFL for tasks assessed/performed Overall Cognitive Status: Within Functional Limits for tasks assessed    Balance     End of Session PT - End of Session Activity Tolerance: Patient limited by pain Patient left: in bed;with call bell/phone within reach Nurse Communication: Patient requests pain meds   GP     Ruthann Cancer 10/17/2013, 12:06 PM  Ruthann Cancer, PT, DPT Acute Rehabilitation Services [phone]

## 2013-10-17 NOTE — Progress Notes (Signed)
OT Cancellation Note and Discharge  Patient Details Name: Jennifer [name] MRN: [id number] DOB: November 24, 1961   Cancelled Treatment:    Reason Eval/Treat Not Completed: Other (comment). Plan is for pt to go to SNF, will defer OT eval to that facility, will sign off.  Evette Georges [phone] 10/17/2013, 8:10 AM

## 2013-10-17 NOTE — Progress Notes (Signed)
Met with Jennifer [name] who is active with [facility]. Reports she understands the need to go to SNF to regain her strength. Made her aware that [facility] Care Management will continue to follow. Left voicemail for inpatient LCSW to find out what facility patient will go to. Patient reports she wants to go to [facility] on [address] (1st choice), [facility] (2nd choice), and third choice was [facility] on [address]. Patient's nurse obtained these choices to give to inpatient LCSW. Jennifer [name] appreciative of visit.  Raiford Noble, MSN-Ed, RN,BSN- [facility] [phone]

## 2013-10-17 NOTE — Telephone Encounter (Signed)
Fax from pharmacy - requesting rx for pen needles.  Thanks

## 2013-10-17 NOTE — Anesthesia Postprocedure Evaluation (Signed)
  Anesthesia Post-op Note  Patient: Jennifer [name]  Procedure(s) Performed: Procedure(s) with comments: AMPUTATION BELOW KNEE- left (Left) - Left Below Knee Amputation   Patient Location: PACU  Anesthesia Type:General  Level of Consciousness: awake, alert  and oriented  Airway and Oxygen Therapy: Patient Spontanous Breathing and Patient connected to nasal cannula oxygen  Post-op Pain: mild  Post-op Assessment: Post-op Vital signs reviewed  Post-op Vital Signs: Reviewed  Complications: No apparent anesthesia complications

## 2013-10-17 NOTE — Discharge Summary (Signed)
Physician Discharge Summary  [name] MRN: [id number] DOB/AGE: 01/05/1961 52 y.o.  Admit date: 10/14/2013 Discharge date: 10/17/2013  Admission Diagnoses: Dehiscence left midfoot amputation  Discharge Diagnoses:  Principal Problem:   Lower limb amputation, below knee   Discharged Condition: stable  Hospital Course: Patient's hospital course was essentially unremarkable. She underwent a transtibial amputation. Postoperatively patient was unable to progress independently with ambulation and was discharged to skilled nursing.  Consults: None  Significant Diagnostic Studies: labs: Routine labs  Treatments: surgery: See operative note  Discharge Exam: Blood pressure 138/70, pulse 68, temperature 98.1 F (36.7 C), temperature source Oral, resp. rate 20, height 5\' 6"  (1.676 m), weight 120.203 kg (265 lb), SpO2 98.00%. Incision/Wound: dressing clean dry and intact  Disposition: 01-Home or Self Care  Discharge Orders   Future Appointments Provider Department Dept Phone   12/06/2013 10:15 AM Burns Spain, MD [facility] [phone]   Future Orders Complete By Expires   Call MD / Call 911  As directed    Comments:     If you experience chest pain or shortness of breath, CALL 911 and be transported to the hospital emergency room.  If you develope a fever above 101 F, pus (white drainage) or increased drainage or redness at the wound, or calf pain, call your surgeon's office.   Constipation Prevention  As directed    Comments:     Drink plenty of fluids.  Prune juice may be helpful.  You may use a stool softener, such as Colace (over the counter) 100 mg twice a day.  Use MiraLax (over the counter) for constipation as needed.   Diet - low sodium heart healthy  As directed    Elevate operative extremity  As directed    Increase activity slowly as tolerated  As directed    Non weight bearing  As directed    Questions:     Laterality:   left   Extremity:  Lower       Medication List         albuterol 108 (90 BASE) MCG/ACT inhaler  Commonly known as:  PROAIR HFA  Inhale 2 puffs into the lungs every 6 (six) hours as needed for wheezing.     albuterol (5 MG/ML) 0.5% nebulizer solution  Commonly known as:  PROVENTIL  Take 0.5 mLs (2.5 mg total) by nebulization every 6 (six) hours as needed for wheezing or shortness of breath.     atenolol 100 MG tablet  Commonly known as:  TENORMIN  Take 1 tablet (100 mg total) by mouth every morning.     baclofen 10 MG tablet  Commonly known as:  LIORESAL  Take 1 tablet (10 mg total) by mouth 3 (three) times daily.     benazepril 40 MG tablet  Commonly known as:  LOTENSIN  Take 40 mg by mouth daily.     buPROPion 150 MG 12 hr tablet  Commonly known as:  WELLBUTRIN SR  Take 1 tablet (150 mg total) by mouth 2 (two) times daily.     diphenhydrAMINE 25 MG tablet  Commonly known as:  BENADRYL  Take 25 mg by mouth every 6 (six) hours.     esomeprazole 20 MG capsule  Commonly known as:  NEXIUM  Take 2 capsules (40 mg total) by mouth daily before breakfast.     fluticasone 50 MCG/ACT nasal spray  Commonly known as:  FLONASE  Place 2 sprays into the nose 2 (two)  times daily as needed for allergies.     Fluticasone-Salmeterol 250-50 MCG/DOSE Aepb  Commonly known as:  ADVAIR DISKUS  Inhale 1 puff into the lungs 2 (two) times daily.     furosemide 40 MG tablet  Commonly known as:  LASIX  Take 40 mg by mouth 2 (two) times daily. Take 1 tablet in the morning and 1 in the afternoon     insulin lispro protamine-lispro (75-25) 100 UNIT/ML Susp injection  Commonly known as:  HUMALOG 75/25  Inject 75-140 Units into the skin 2 (two) times daily with a meal. Inject 135 units in the morning and 70 units in the evening     ipratropium 0.02 % nebulizer solution  Commonly known as:  ATROVENT  Take 2.5 mLs (0.5 mg total) by nebulization every 6 (six) hours as needed for wheezing.      metFORMIN 1000 MG tablet  Commonly known as:  GLUCOPHAGE  Take 1 tablet (1,000 mg total) by mouth 2 (two) times daily.     oxyCODONE-acetaminophen 5-325 MG per tablet  Commonly known as:  ROXICET  Take 2 tablets by mouth every 6 (six) hours as needed for pain.     oxyCODONE-acetaminophen 5-325 MG per tablet  Commonly known as:  ROXICET  Take 1 tablet by mouth every 4 (four) hours as needed for pain.     pravastatin 40 MG tablet  Commonly known as:  PRAVACHOL  Take 1 tablet (40 mg total) by mouth every morning.     pregabalin 200 MG capsule  Commonly known as:  LYRICA  Take 200 mg by mouth 3 (three) times daily.     travoprost (benzalkonium) 0.004 % ophthalmic solution  Commonly known as:  TRAVATAN  1 drop at bedtime.     traZODone 100 MG tablet  Commonly known as:  DESYREL  Take 1 tablet (100 mg total) by mouth at bedtime.     warfarin 1 MG tablet  Commonly known as:  COUMADIN  Take 1 tablet (1 mg total) by mouth daily.           Follow-up Information   Follow up with Romond Pipkins V, MD In 2 weeks.   Specialty:  Orthopedic Surgery   Contact information:   [address] [place] [address] [phone]       Signed: Nadara Mustard 10/17/2013, 6:42 AM

## 2013-10-17 NOTE — Clinical Social Work Note (Signed)
CSW has compiled discharge packet and arranged for transportation to [facility]. CSW also called pt's son and let him know pt is being discharged to [facility].    Maryclare Labrador, MSW, [facility] Clinical Social Worker [phone]

## 2013-10-17 NOTE — Telephone Encounter (Signed)
Pharmacy informed.

## 2013-10-18 ENCOUNTER — Other Ambulatory Visit: Payer: Self-pay | Admitting: Internal Medicine

## 2013-10-19 NOTE — Telephone Encounter (Signed)
She is at a SNF. Will they not take care of meds?

## 2013-10-19 NOTE — Telephone Encounter (Signed)
Can u refuse refill.

## 2013-11-03 NOTE — Progress Notes (Signed)
Case discussed with Dr. Glenn soon after the resident saw the patient.  We reviewed the resident's history and exam and pertinent patient test results.  I agree with the assessment, diagnosis, and plan of care documented in the resident's note. 

## 2013-11-08 ENCOUNTER — Other Ambulatory Visit: Payer: Self-pay | Admitting: Internal Medicine

## 2013-11-08 NOTE — Telephone Encounter (Signed)
Too many sedating meds. This is the least necessary one.

## 2013-11-11 ENCOUNTER — Other Ambulatory Visit: Payer: Self-pay | Admitting: *Deleted

## 2013-11-11 MED ORDER — INSULIN PEN NEEDLE 31G X 5 MM MISC: Status: DC

## 2013-11-11 NOTE — Telephone Encounter (Signed)
Fax from [facility] - requesting rx for pen needles; please include direction and dx code.  Thanks

## 2013-11-14 ENCOUNTER — Telehealth: Payer: Self-pay | Admitting: Licensed Clinical Social Worker

## 2013-11-14 NOTE — Telephone Encounter (Signed)
I called the patient to remind her of her mammogram and she states that she just had an amputation done and was not thinking about a mammogram right now. Order was not placed at this time.

## 2013-11-15 ENCOUNTER — Other Ambulatory Visit: Payer: Self-pay | Admitting: Internal Medicine

## 2013-11-19 ENCOUNTER — Emergency Department (HOSPITAL_COMMUNITY): Payer: Medicare Other

## 2013-11-19 ENCOUNTER — Encounter (HOSPITAL_COMMUNITY): Payer: Self-pay | Admitting: Emergency Medicine

## 2013-11-19 ENCOUNTER — Emergency Department (HOSPITAL_COMMUNITY)
Admission: EM | Admit: 2013-11-19 | Discharge: 2013-11-19 | Disposition: A | Discharge status: Home / Self Care | Payer: Medicare Other | Attending: Emergency Medicine | Admitting: Emergency Medicine

## 2013-11-19 DIAGNOSIS — Z794 Long term (current) use of insulin: Secondary | ICD-10-CM | POA: Insufficient documentation

## 2013-11-19 DIAGNOSIS — Y9301 Activity, walking, marching and hiking: Secondary | ICD-10-CM | POA: Insufficient documentation

## 2013-11-19 DIAGNOSIS — Z87891 Personal history of nicotine dependence: Secondary | ICD-10-CM | POA: Insufficient documentation

## 2013-11-19 DIAGNOSIS — I10 Essential (primary) hypertension: Secondary | ICD-10-CM | POA: Insufficient documentation

## 2013-11-19 DIAGNOSIS — I5032 Chronic diastolic (congestive) heart failure: Secondary | ICD-10-CM | POA: Insufficient documentation

## 2013-11-19 DIAGNOSIS — Y92009 Unspecified place in unspecified non-institutional (private) residence as the place of occurrence of the external cause: Secondary | ICD-10-CM | POA: Insufficient documentation

## 2013-11-19 DIAGNOSIS — IMO0001 Reserved for inherently not codable concepts without codable children: Secondary | ICD-10-CM | POA: Insufficient documentation

## 2013-11-19 DIAGNOSIS — Z860100 Personal history of colon polyps, unspecified: Secondary | ICD-10-CM | POA: Insufficient documentation

## 2013-11-19 DIAGNOSIS — Z9889 Other specified postprocedural states: Secondary | ICD-10-CM | POA: Insufficient documentation

## 2013-11-19 DIAGNOSIS — M545 Low back pain, unspecified: Secondary | ICD-10-CM | POA: Insufficient documentation

## 2013-11-19 DIAGNOSIS — Z885 Allergy status to narcotic agent status: Secondary | ICD-10-CM | POA: Insufficient documentation

## 2013-11-19 DIAGNOSIS — W19XXXA Unspecified fall, initial encounter: Secondary | ICD-10-CM

## 2013-11-19 DIAGNOSIS — Z8709 Personal history of other diseases of the respiratory system: Secondary | ICD-10-CM | POA: Insufficient documentation

## 2013-11-19 DIAGNOSIS — K219 Gastro-esophageal reflux disease without esophagitis: Secondary | ICD-10-CM | POA: Insufficient documentation

## 2013-11-19 DIAGNOSIS — H409 Unspecified glaucoma: Secondary | ICD-10-CM | POA: Insufficient documentation

## 2013-11-19 DIAGNOSIS — W010XXA Fall on same level from slipping, tripping and stumbling without subsequent striking against object, initial encounter: Secondary | ICD-10-CM | POA: Insufficient documentation

## 2013-11-19 DIAGNOSIS — S88119A Complete traumatic amputation at level between knee and ankle, unspecified lower leg, initial encounter: Secondary | ICD-10-CM | POA: Insufficient documentation

## 2013-11-19 DIAGNOSIS — Z9119 Patient's noncompliance with other medical treatment and regimen: Secondary | ICD-10-CM | POA: Insufficient documentation

## 2013-11-19 DIAGNOSIS — Z79899 Other long term (current) drug therapy: Secondary | ICD-10-CM | POA: Insufficient documentation

## 2013-11-19 DIAGNOSIS — Z8719 Personal history of other diseases of the digestive system: Secondary | ICD-10-CM | POA: Insufficient documentation

## 2013-11-19 DIAGNOSIS — T8339XA Other mechanical complication of intrauterine contraceptive device, initial encounter: Secondary | ICD-10-CM | POA: Insufficient documentation

## 2013-11-19 DIAGNOSIS — Z888 Allergy status to other drugs, medicaments and biological substances status: Secondary | ICD-10-CM | POA: Insufficient documentation

## 2013-11-19 DIAGNOSIS — J45909 Unspecified asthma, uncomplicated: Secondary | ICD-10-CM | POA: Insufficient documentation

## 2013-11-19 DIAGNOSIS — G894 Chronic pain syndrome: Secondary | ICD-10-CM | POA: Insufficient documentation

## 2013-11-19 DIAGNOSIS — IMO0002 Reserved for concepts with insufficient information to code with codable children: Secondary | ICD-10-CM | POA: Insufficient documentation

## 2013-11-19 DIAGNOSIS — J449 Chronic obstructive pulmonary disease, unspecified: Secondary | ICD-10-CM | POA: Insufficient documentation

## 2013-11-19 DIAGNOSIS — J4489 Other specified chronic obstructive pulmonary disease: Secondary | ICD-10-CM | POA: Insufficient documentation

## 2013-11-19 DIAGNOSIS — Z86718 Personal history of other venous thrombosis and embolism: Secondary | ICD-10-CM | POA: Insufficient documentation

## 2013-11-19 DIAGNOSIS — F329 Major depressive disorder, single episode, unspecified: Secondary | ICD-10-CM | POA: Insufficient documentation

## 2013-11-19 DIAGNOSIS — Z9109 Other allergy status, other than to drugs and biological substances: Secondary | ICD-10-CM | POA: Insufficient documentation

## 2013-11-19 DIAGNOSIS — E1159 Type 2 diabetes mellitus with other circulatory complications: Secondary | ICD-10-CM

## 2013-11-19 DIAGNOSIS — Z91199 Patient's noncompliance with other medical treatment and regimen due to unspecified reason: Secondary | ICD-10-CM | POA: Insufficient documentation

## 2013-11-19 DIAGNOSIS — I739 Peripheral vascular disease, unspecified: Secondary | ICD-10-CM | POA: Insufficient documentation

## 2013-11-19 DIAGNOSIS — T8131XA Disruption of external operation (surgical) wound, not elsewhere classified, initial encounter: Secondary | ICD-10-CM

## 2013-11-19 DIAGNOSIS — F3289 Other specified depressive episodes: Secondary | ICD-10-CM | POA: Insufficient documentation

## 2013-11-19 DIAGNOSIS — E785 Hyperlipidemia, unspecified: Secondary | ICD-10-CM | POA: Insufficient documentation

## 2013-11-19 DIAGNOSIS — Z8679 Personal history of other diseases of the circulatory system: Secondary | ICD-10-CM | POA: Insufficient documentation

## 2013-11-19 LAB — CBC
HCT: 32.7 % — ABNORMAL LOW (ref 36.0–46.0)
Hemoglobin: 10.6 g/dL — ABNORMAL LOW (ref 12.0–15.0)
MCH: 29.6 pg (ref 26.0–34.0)
MCHC: 32.4 g/dL (ref 30.0–36.0)
MCV: 91.3 fL (ref 78.0–100.0)
Platelets: 216 10*3/uL (ref 150–400)
RBC: 3.58 MIL/uL — ABNORMAL LOW (ref 3.87–5.11)
RDW: 16.4 % — ABNORMAL HIGH (ref 11.5–15.5)
WBC: 9.6 10*3/uL (ref 4.0–10.5)

## 2013-11-19 LAB — BASIC METABOLIC PANEL
BUN: 14 mg/dL (ref 6–23)
CO2: 27 mEq/L (ref 19–32)
Calcium: 9 mg/dL (ref 8.4–10.5)
Chloride: 103 mEq/L (ref 96–112)
Creatinine, Ser: 0.8 mg/dL (ref 0.50–1.10)
GFR calc Af Amer: >90 mL/min (ref >90)
GFR calc non Af Amer: 82 mL/min — ABNORMAL LOW (ref >90)
Glucose, Bld: 45 mg/dL — ABNORMAL LOW (ref 70–99)
Potassium: 3.5 mEq/L (ref 3.5–5.1)
Sodium: 142 mEq/L (ref 135–145)

## 2013-11-19 MED ORDER — OXYCODONE-ACETAMINOPHEN 5-325 MG PO TABS
2.0000 | ORAL_TABLET | Freq: Once | ORAL | Status: AC
  Administered 2013-11-19: 2 via ORAL
  Filled 2013-11-19: qty 2

## 2013-11-19 MED ORDER — MUPIROCIN CALCIUM 2 % EX CREA: 1.0000 "application " | TOPICAL_CREAM | Freq: Two times a day (BID) | CUTANEOUS | Status: DC

## 2013-11-19 NOTE — ED Notes (Signed)
Patient transported to X-ray 

## 2013-11-19 NOTE — ED Notes (Signed)
Family updated on wait 

## 2013-11-19 NOTE — ED Notes (Signed)
Pt given a something to drink ok per nurse Neita Carp)

## 2013-11-19 NOTE — ED Notes (Signed)
Pt had BKA of left leg on OCT 23/14.  PT fell directly on left stump and it started bleeding through her bandages and acewrap and they have tied a towel on it.  Pt does have some blood trickling from stump.  Pt reports a lot of pain.

## 2013-11-19 NOTE — ED Provider Notes (Signed)
CSN: [id number]     Arrival date & time 11/19/13  1647 History   First MD Initiated Contact with Patient 11/19/13 1658     Chief Complaint  Patient presents with  . Bleeding/Bruising    left BKA stump   (Consider location/radiation/quality/duration/timing/severity/associated sxs/prior Treatment) The history is provided by the patient and medical records. No language interpreter was used.    Jennifer [name] is a 52 y.o. female  with a hx of depression, GERD, HTN, PVD, COPD, IDDM, CHF, RA, chronic pain presents to the Emergency Department complaining of acute, persistent, bleeding from the incision site of her Left BKA after direct fall onto the wound. Pt reports amputation was performed in Oct 2014 by Dr. Lajoyce Corners and sutures were removed on Nov 21st.  She reports at that time she had eschar noted to the medial side of the laceration but was without "white discoloration and redness."  Associated symptoms include pain at the site.  Today pt stumbled while trying to get to the bedside commode and fell directly onto the BKA.  Since that time she has had bleeding and increased pain at the sire.  Nothing makes it better and movement and palpation makes it worse.  Pt reports last dose of home pain medication was 4 hours PTA.  Pt denies fevers, chills, headache, neck pain, CO, SOB, abd pain, N/V/D, weakness, dizziness, syncope.        Past Medical History  Diagnosis Date  . Depression   . GERD (gastroesophageal reflux disease)   . Hyperlipidemia   . Hypertension   . Glaucoma   . PVD (peripheral vascular disease) with claudication     Stents to bilateral common iliac arteries (left 2005, right 2008), on chronic plavix  . Hyperplastic colon polyp 12/2010    Per colonoscopy (12/2010) - Dr. Arlyce Dice  . Asthma   . COPD 01/08/2007    PFT's 05/2007 : FEV1/FVC 82, FEV1 64% pred, FEF 25-75% 40% predicted, 16% improvement in FEV1 with bronchodilators.     . Tobacco abuse   . Chronic osteomyelitis of foot      chronic, right secondary to diabetic foot ulcers  . Polymicrobial bacterial infection 01/2013    GBS and S. aureus bacteremia // Source likely infected diabetic foot ulcer  . 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 Blood cultures 01/26/13 S. Aureus and GBS. Blood cultures Feb 6th, 8th, and 9th and March were negative.Repeat TEE 3/20 negative for vegitations  . 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 Chronic diastolic heart failure     grade 2 per 2D echocardiogram (01/2013)  . Ulcer of foot, chronic     Left. No OM per MRI (01/2013)  . DVT of upper extremity (deep vein thrombosis) 03/11/2013    Secondary to PICC line. Right brachial vein, diagnosed on 03/10/2013 Coumadin for 3 months. End date 06/10/2013   . Chronic pain syndrome 12/03/2011    Likely secondary to depression, "fibromyalgia", neuropathy, and obesity. Lumbar MRI 2014 no sig change from prior (2008) : Stable hypertrophic facet disease most notable at L4-5. Stable shallow left foraminal/extraforaminal disc protrusion at L4-5. No direct neural compression.      . Environmental allergies     Hx: of  . Fatty liver 2003    observed on ultrasound abdomen  . CHF (congestive heart failure)   . Anginal pain     '3' of 10 ischemia ruled out 9/9   .  Fibromyalgia   . Chronic bronchitis     "I get it alot" (09/28/2013)  . Exertional shortness of breath   . Rheumatoid arthritis   . Arthritis of lumbar spine   . Chronic lower back pain   . Diabetic peripheral neuropathy   . Type II diabetes mellitus with peripheral circulatory disorders, uncontrolled DX: 1993    Insulin dep. Poor control. Complicated by diabetic foot ulcer and diabetic eye disease.     Past Surgical History  Procedure Laterality Date  . Abdominal hysterectomy  1997    secondary to uterine fibroids  . Tubal ligation    . Shoulder arthroscopy w/ rotator cuff repair Bilateral   . Breast biopsy       multiple-benign per pt  . Wrist surgery Right     "for tumors" (09/28/2013)  . Ganglion cyst excision      multiple  . Other surgical history      stents in lower ext  . Tee without cardioversion N/A 01/31/2013    Procedure: TRANSESOPHAGEAL ECHOCARDIOGRAM (TEE);  Surgeon: Pricilla Riffle, MD;  Location: [facility] ENDOSCOPY;  Service: Cardiovascular;  Laterality: N/A;  Rm [phone]  . Bladder surgery      bladder reconstruction surgery  . Tee without cardioversion N/A 03/10/2013    Procedure: TRANSESOPHAGEAL ECHOCARDIOGRAM (TEE);  Surgeon: Laurey Morale, MD;  Location: [facility] ENDOSCOPY;  Service: Cardiovascular;  Laterality: N/A;  Rm. [id number]  . Skin split graft Bilateral 05/13/2013    Procedure: Right and Left Foot Allograft Skin Graft;  Surgeon: Nadara Mustard, MD;  Location: [facility] OR;  Service: Orthopedics;  Laterality: Bilateral;  Right and Left Foot Allograft Skin Graft  . Toe amputation Left 08/31/2013    4TH & 5 TH TOE   . Amputation Left 08/31/2013    Procedure: AMPUTATION RAY;  Surgeon: Nadara Mustard, MD;  Location: [facility] OR;  Service: Orthopedics;  Laterality: Left;  Left Foot 5th Ray Amputation  . Esophagogastroduodenoscopy N/A 09/20/2013    Procedure: ESOPHAGOGASTRODUODENOSCOPY (EGD);  Surgeon: Beverley Fiedler, MD;  Location: [facility] ENDOSCOPY;  Service: Gastroenterology;  Laterality: N/A;  . Foot amputation through metatarsal Left 09/28/2013  . Tonsillectomy    . Amputation Left 09/28/2013    Procedure: Left Midfoot amputation;  Surgeon: Nadara Mustard, MD;  Location: [facility] OR;  Service: Orthopedics;  Laterality: Left;  Left Midfoot amputation  . Amputation Left 10/14/2013    Procedure: AMPUTATION BELOW KNEE- left;  Surgeon: Nadara Mustard, MD;  Location: [facility] OR;  Service: Orthopedics;  Laterality: Left;  Left Below Knee Amputation    Family History  Problem Relation Age of Onset  . Diverticulosis Mother   . Diabetes Mother   . Hypertension Mother   . Congestive Heart Failure Mother   . Asthma Father   . CAD Sister 4     MI at age 60 per patient.  However, she has not had a stent or CABG.    History  Substance Use Topics  . Smoking status: Former Smoker -- 0.50 packs/day for 44 years    Types: Cigarettes    Quit date: 10/03/2013  . Smokeless tobacco: Never Used  . Alcohol Use: No   OB History   Grav Para Term Preterm Abortions TAB SAB Ect Mult Living                 Review of Systems  Constitutional: Negative for fever, diaphoresis, appetite change, fatigue and unexpected weight change.  HENT: Negative for mouth sores.  Eyes: Negative for visual disturbance.  Respiratory: Negative for cough, chest tightness, shortness of breath and wheezing.   Cardiovascular: Negative for chest pain.  Gastrointestinal: Negative for nausea, vomiting, abdominal pain, diarrhea and constipation.  Endocrine: Negative for polydipsia, polyphagia and polyuria.  Genitourinary: Negative for dysuria, urgency, frequency and hematuria.  Musculoskeletal: Negative for back pain and neck stiffness.  Skin: Positive for wound. Negative for rash.  Allergic/Immunologic: Negative for immunocompromised state.  Neurological: Negative for syncope, light-headedness and headaches.  Hematological: Does not bruise/bleed easily.  Psychiatric/Behavioral: Negative for sleep disturbance. The patient is not nervous/anxious.     Allergies  Iohexol; Ivp dye; and Morphine sulfate  Home Medications   Current Outpatient Rx  Name  Route  Sig  Dispense  Refill  . albuterol (PROVENTIL HFA;VENTOLIN HFA) 108 (90 BASE) MCG/ACT inhaler   Inhalation   Inhale 2 puffs into the lungs every 6 (six) hours as needed for wheezing or shortness of breath.         Marland Kitchen albuterol (PROVENTIL) (5 MG/ML) 0.5% nebulizer solution   Nebulization   Take 2.5 mg by nebulization every 6 (six) hours as needed for wheezing or shortness of breath.         Marland Kitchen atenolol (TENORMIN) 100 MG tablet   Oral   Take 100 mg by mouth daily.         . baclofen (LIORESAL) 10  MG tablet   Oral   Take 1 tablet (10 mg total) by mouth 3 (three) times daily as needed for muscle spasms.   20 tablet   1   . benazepril (LOTENSIN) 40 MG tablet   Oral   Take 40 mg by mouth daily.         . benazepril (LOTENSIN) 40 MG tablet      None Entered         . buPROPion (WELLBUTRIN SR) 150 MG 12 hr tablet   Oral   Take 150 mg by mouth 2 (two) times daily.         . diphenhydrAMINE (BENADRYL) 25 MG tablet   Oral   Take 25 mg by mouth every 6 (six) hours.          Marland Kitchen esomeprazole (NEXIUM) 20 MG capsule   Oral   Take 40 mg by mouth every morning.          . Fluticasone-Salmeterol (ADVAIR DISKUS) 250-50 MCG/DOSE AEPB   Inhalation   Inhale 1 puff into the lungs 2 (two) times daily.   180 each   1   . furosemide (LASIX) 40 MG tablet   Oral   Take 40 mg by mouth See admin instructions. Take 40 mg in the morning and 40 mg in the afternoon         . Insulin Lispro Prot & Lispro (HUMALOG MIX 75/25 KWIKPEN) (75-25) 100 UNIT/ML SUPN      Inject 135 units in the AM before breakfast and 70 units in the PM before evening meal   75 mL   5   . insulin lispro protamine-lispro (HUMALOG 75/25) (75-25) 100 UNIT/ML SUSP injection   Subcutaneous   Inject 75-140 Units into the skin 2 (two) times daily with a meal. Inject 135 units in the morning and 70 units in the evening   10 mL      . metFORMIN (GLUCOPHAGE) 1000 MG tablet   Oral   Take 1,000 mg by mouth 2 (two) times daily with a meal.         .  oxyCODONE-acetaminophen (ROXICET) 5-325 MG per tablet   Oral   Take 2 tablets by mouth every 6 (six) hours as needed for pain.   240 tablet   0     Rx 2 of 2. Please fill 30 days after printed date.   . pravastatin (PRAVACHOL) 40 MG tablet   Oral   Take 40 mg by mouth daily.         . pregabalin (LYRICA) 200 MG capsule   Oral   Take 200 mg by mouth 3 (three) times daily.         Marland Kitchen PROAIR HFA 108 (90 BASE) MCG/ACT inhaler      INHALE 2 PUFFS BY MOUTH  EVERY 6 HOURS AS NEEDED FOR WHEEZING   25.5 g   3   . travoprost, benzalkonium, (TRAVATAN) 0.004 % ophthalmic solution      1 drop at bedtime.         . traZODone (DESYREL) 100 MG tablet   Oral   Take 100 mg by mouth at bedtime.         . mupirocin cream (BACTROBAN) 2 %   Topical   Apply 1 application topically 2 (two) times daily.   15 g   0    BP 163/51  Pulse 72  Temp(Src) 98.3 F (36.8 C) (Oral)  Resp 18  SpO2 98% Physical Exam  Nursing note and vitals reviewed. Constitutional: She appears well-developed and well-nourished. No distress.  Awake, alert, nontoxic appearance  HENT:  Head: Normocephalic and atraumatic.  Mouth/Throat: Oropharynx is clear and moist. No oropharyngeal exudate.  Eyes: Conjunctivae are normal. No scleral icterus.  Neck: Normal range of motion. Neck supple.  Cardiovascular: Normal rate, regular rhythm and intact distal pulses.   Pulmonary/Chest: Effort normal and breath sounds normal. No respiratory distress. She has no wheezes.  Abdominal: Soft. Bowel sounds are normal. She exhibits no mass. There is no tenderness. There is no rebound and no guarding.  Musculoskeletal: Normal range of motion. She exhibits no edema.  Left BKA with medial side of the wound with mild erythema without induration and retained sutures; no oozing;  Lateral wound dehiscence of approx 3.5 cm with controlled bleeding  Neurological: She is alert.  Speech is clear and goal oriented Moves extremities without ataxia  Skin: Skin is warm and dry. She is not diaphoretic.  Psychiatric: She has a normal mood and affect.    ED Course  LACERATION REPAIR Date/Time: 11/19/2013 7:38 PM Performed by: Dierdre Forth Authorized by: Dierdre Forth Consent: Verbal consent obtained. Risks and benefits: risks, benefits and alternatives were discussed Consent given by: patient Patient understanding: patient states understanding of the procedure being  performed Patient consent: the patient's understanding of the procedure matches consent given Procedure consent: procedure consent matches procedure scheduled Relevant documents: relevant documents present and verified Site marked: the operative site was marked Imaging studies: imaging studies available Required items: required blood products, implants, devices, and special equipment available Patient identity confirmed: verbally with patient and arm band Time out: Immediately prior to procedure a "time out" was called to verify the correct patient, procedure, equipment, support staff and site/side marked as required. Body area: lower extremity (L BKA incision site) Wound length (cm): 2 areas approx 3.5 each of wound dehiscence. Foreign bodies: no foreign bodies Patient sedated: no Irrigation solution: saline Irrigation method: syringe Skin closure: Steri-Strips Number of sutures: 6 Technique: simple Approximation: loose Approximation difficulty: simple Dressing: 4x4 sterile gauze Patient tolerance: Patient tolerated the procedure well  with no immediate complications.   (including critical care time) Labs Review Labs Reviewed  CBC - Abnormal; Notable for the following:    RBC 3.58 (*)    Hemoglobin 10.6 (*)    HCT 32.7 (*)    RDW 16.4 (*)    All other components within normal limits  BASIC METABOLIC PANEL - Abnormal; Notable for the following:    Glucose, Bld 45 (*)    GFR calc non Af Amer 82 (*)    All other components within normal limits   Imaging Review Dg Tibia/fibula Left  11/19/2013   CLINICAL DATA:  Bleeding and bruising. Fell today. Left thumb pain.  EXAM: LEFT TIBIA AND FIBULA - 2 VIEW  COMPARISON:  None.  FINDINGS: There are postsurgical changes of below-the-knee amputation. There are few amorphous calcifications adjacent to the tibial stump, of doubtful significance. There are a few feels a gas in the subcutaneous tissues of the medial and anterior aspect of the  tibial stump, which could be due to recent fall. No acute fracture is identified.  IMPRESSION: 1. Few locules of gas in the subcutaneous soft tissues of the patient's stomach could be due to recent fall. 2. No acute bony abnormality identified.   Electronically Signed   By: Britta Mccreedy M.D.   On: 11/19/2013 18:43    EKG Interpretation   None       MDM   1. Fall at home, initial encounter   2. Lower limb amputation, below knee   3. Wound dehiscence, initial encounter   4. Type II diabetes mellitus with peripheral circulatory disorders, uncontrolled (250.72)      [name] presents with hx of noncompliance with repeat wound dehiscence after fall directly onto the BKA.  Pt with eschar and questionable infection of the medial portion of the wound.  Hemostasis achieved at the site of dehiscence.    7:04 PM Patient with locules of gas in the subcutaneous soft tissue on the medial side at the location of the eschar and retained sutures.  Will plan to steri-strip site of dehiscence, will obtain basic blood work and discuss with Lajoyce Corners  9:22 PM Patient without leukocytosis, BMP unremarkable. Discussed with Dr. Gerilyn Nestle orthopedics. He recommends Bactroban cream, wrapping in the wound and followup with Dr. Lajoyce Corners on Monday. He does not feel as if she needs oral antibiotics at this point and recommend not removing the sutures.  He agrees that loculated air is likely from fall.  Patient remains alert, oriented, nontoxic and nonseptic appearing.  She is afebrile and non tachycardiac.  It has been determined that no acute conditions requiring further emergency intervention are present at this time. The patient/guardian have been advised of the diagnosis and plan. We have discussed signs and symptoms that warrant return to the ED, such as changes or worsening in symptoms.   Vital signs are stable at discharge.   BP 163/51  Pulse 72  Temp(Src) 98.3 F (36.8 C) (Oral)  Resp 18  SpO2  98%  Patient/guardian has voiced understanding and agreed to follow-up with the PCP or specialist.   The patient was discussed with and seen by Dr. Elesa Massed who agrees with the treatment plan.        Dierdre Forth, PA-C 11/19/13 2127

## 2013-11-19 NOTE — ED Provider Notes (Signed)
Medical screening examination/treatment/procedure(s) were conducted as a shared visit with non-physician practitioner(s) and myself.  I personally evaluated the patient during the encounter.  EKG Interpretation   None       Pt is a 52 y.o. female with a history of insulin-dependent diabetes with left BKA 10/13/2013 who presents emergency department with wound dehiscence after she fell today. She has bleeding from her left stump and pain. She denies any recent fevers or drainage. Patient has had revisions of this surgery in the past due to medical noncompliance.  X-ray show subcutaneous gas over the medial aspect of the stump where the wound is healing. Her open wound is lateral and is approximately 4 cm. She does have sutures in her medial wound have not yet been removed. There is granulation tissue and a large eschar but no purulent drainage, erythema or warmth, induration. Given this area of subcutaneous gas, will obtain basic labs and discuss with orthopedics.  Layla Maw Zeke Aker, DO 11/20/13 0111

## 2013-11-19 NOTE — ED Notes (Addendum)
Pt had a Left BKA on 10/13/2013, pt reports falling and landing directly on her stump causing the area to bleed and having increase pain to site. Pt reports she still has some sutures in her stump but they were supposed  To removed them several weeks ago.

## 2013-11-19 NOTE — Discharge Instructions (Signed)
1. Medications: Bactroban cream, usual home medications 2. Treatment: rest, drink plenty of fluids, keep wound clean and dry, apply Bactroban as directed 3. Follow Up: Please followup with your primary doctor for discussion of your diagnoses and further evaluation after today's visit; if you do not have a primary care doctor use the resource guide provided to find one; followup with Dr. Lajoyce Corners by calling his office on Monday and scheduling an appointment   Wound Dehiscence Wound dehiscence is when a cut (incision) breaks open and does not heal properly after surgery. It usually happens 7 to 10 days after surgery. When a wound dehiscence is noticed and treated early, it is not always dangerous.  CAUSES   Stretching of the wound area caused by lifting, vomiting, violent coughing, or straining due to constipation.  Wound infection.  Early stitch (suture) removal.  Poor nutrition prior to surgery. Other risk factors include:  Obesity.  Lung disease.  Smoking.  Poor nutrition.  Contamination during surgery. SYMPTOMS  Bleeding from the wound.  Pain.  Fever.  Wound starts breaking open. DIAGNOSIS  Your caregiver may diagnose wound dehiscence by monitoring the incision and noting wound changes. These changes can include an increase in drainage, pain, or complaints of stretching or tearing of the wound.  Wound cultures may be taken to determine if there is an infection.  Imaging studies, such as an MRI scan or CT scan, may be done to determine if there is a collection of pus or fluid. TREATMENT Treatment may include:  Wound care.  Surgical repair.  Antibiotic medicine.  Medicines to reduce pain and swelling. HOME CARE INSTRUCTIONS   Only take over-the-counter or prescription medicines for pain, discomfort, or fever as directed by your caregiver. Taking pain medicine 30 minutes before changing a bandage (dressing) can help relieve pain.  Take your antibiotics as  directed. Finish them even if you start to feel better.  Gently wash the area with mild soap and water 2 times a day, or as directed. Rinse off the soap. Pat the area dry with a clean towel. Do not rub the wound. This may cause bleeding.  Follow your caregiver's instructions for how often you need to change the dressing and packing inside. Wash your hands well before and after changing your dressing. Apply a dressing to the wound as directed.  Take showers. Do not take tub baths, swim, or do anything that may soak the wound until it is healed.  Avoid exercises that make you sweat heavily.  Use anti-itch medicine as directed by your caregiver. The wound may itch when it is healing. Do not pick or scratch at the wound.  Do not lift more than 10 pounds (4.5 kg) until the wound is healed, or as directed by your caregiver.  Keep all follow-up appointments as directed. SEEK IMMEDIATE MEDICAL CARE IF:   You have increased swelling or redness around the wound.  You have increasing pain in the wound.  You have an increasing amount of pus coming from the wound.  More of the wound breaks open.  You have a fever. MAKE SURE YOU:   Understand these instructions.  Will watch your condition.  Will get help right away if you are not doing well or get worse. Document Released: 02/28/2004 Document Revised: 03/01/2012 Document Reviewed: 04/13/2011 [facility] Patient Information 2014 [place], [place].

## 2013-11-21 ENCOUNTER — Ambulatory Visit: Payer: Medicare Other | Attending: Internal Medicine | Admitting: Physical Therapy

## 2013-11-21 DIAGNOSIS — R5381 Other malaise: Secondary | ICD-10-CM | POA: Insufficient documentation

## 2013-11-21 DIAGNOSIS — M24569 Contracture, unspecified knee: Secondary | ICD-10-CM | POA: Insufficient documentation

## 2013-11-21 DIAGNOSIS — Z4789 Encounter for other orthopedic aftercare: Secondary | ICD-10-CM | POA: Insufficient documentation

## 2013-11-21 DIAGNOSIS — M6281 Muscle weakness (generalized): Secondary | ICD-10-CM | POA: Insufficient documentation

## 2013-11-21 DIAGNOSIS — R269 Unspecified abnormalities of gait and mobility: Secondary | ICD-10-CM | POA: Insufficient documentation

## 2013-11-21 DIAGNOSIS — IMO0001 Reserved for inherently not codable concepts without codable children: Secondary | ICD-10-CM | POA: Insufficient documentation

## 2013-11-21 DIAGNOSIS — M25569 Pain in unspecified knee: Secondary | ICD-10-CM | POA: Insufficient documentation

## 2013-12-06 ENCOUNTER — Encounter: Payer: Medicare Other | Admitting: Internal Medicine

## 2013-12-14 ENCOUNTER — Other Ambulatory Visit: Payer: Self-pay | Admitting: Internal Medicine

## 2013-12-19 ENCOUNTER — Other Ambulatory Visit: Payer: Self-pay | Admitting: *Deleted

## 2013-12-19 DIAGNOSIS — G894 Chronic pain syndrome: Secondary | ICD-10-CM

## 2013-12-19 NOTE — Telephone Encounter (Signed)
Talked to pt - stated Jennifer [name] is from the [facility]. And she states she is due for refill on 12/24/13.  Thanks

## 2013-12-19 NOTE — Telephone Encounter (Signed)
Denied. Got 360 on 11/23/13 by a Deetta Perla. Please ask her who that is.

## 2013-12-19 NOTE — Telephone Encounter (Signed)
Too early for Baclofen

## 2013-12-20 ENCOUNTER — Other Ambulatory Visit: Payer: Self-pay | Admitting: Internal Medicine

## 2013-12-20 DIAGNOSIS — G894 Chronic pain syndrome: Secondary | ICD-10-CM

## 2013-12-20 MED ORDER — OXYCODONE-ACETAMINOPHEN 5-325 MG PO TABS: 2.0000 | ORAL_TABLET | Freq: Four times a day (QID) | ORAL | Status: DC | PRN

## 2013-12-20 NOTE — Telephone Encounter (Signed)
Rx was refilled by Dr Rogelia Boga (see order only encounter); pt called/informed.

## 2013-12-20 NOTE — Telephone Encounter (Signed)
Pt called back asking about her pain med refill; Dr Rogelia Boga paged.

## 2013-12-26 ENCOUNTER — Encounter: Payer: Self-pay | Admitting: Internal Medicine

## 2013-12-30 ENCOUNTER — Encounter: Payer: Self-pay | Admitting: Dietician

## 2013-12-30 ENCOUNTER — Telehealth: Payer: Self-pay | Admitting: *Deleted

## 2013-12-30 ENCOUNTER — Encounter: Payer: Self-pay | Admitting: Internal Medicine

## 2013-12-30 ENCOUNTER — Ambulatory Visit (INDEPENDENT_AMBULATORY_CARE_PROVIDER_SITE_OTHER): Payer: Medicare Other | Admitting: Internal Medicine

## 2013-12-30 VITALS — BP 176/87 | HR 73 | Temp 98.0°F

## 2013-12-30 DIAGNOSIS — E1159 Type 2 diabetes mellitus with other circulatory complications: Secondary | ICD-10-CM

## 2013-12-30 DIAGNOSIS — I10 Essential (primary) hypertension: Secondary | ICD-10-CM

## 2013-12-30 LAB — POCT GLYCOSYLATED HEMOGLOBIN (HGB A1C): Hemoglobin A1C: 6.5

## 2013-12-30 LAB — GLUCOSE, CAPILLARY: Glucose-Capillary: 94 mg/dL (ref 70–99)

## 2013-12-30 NOTE — Telephone Encounter (Signed)
Agree with appt. 

## 2013-12-30 NOTE — Assessment & Plan Note (Addendum)
BP Readings from Last 3 Encounters:  12/30/13 176/87  11/19/13 155/74  10/17/13 99/69    Lab Results  Component Value Date   NA 142 11/19/2013   K 3.5 11/19/2013   CREATININE 0.80 11/19/2013    Assessment: Blood pressure control:  elevated Progress toward BP goal:   deteriorated Comments: Review of past BPs suggest patient is generally well controlled  Plan: Medications:  continue current medications Blood pressures >200/100 are out of the ordinary for patient, especially for several days in a row.  I suspect BP is elevated secondary to pain (likely phantom limb pain and fibromyalgia).  After leaving SNF, she was given percocet 5-325 #360 (filled on 11/23/13, written by MSN at SNF) - she was taking this as 2 pills every 4 hours as needed.  Then, she refilled her usual dose of Percocet (#240 on 12/23/13 by Dr. [name]) - she takes this 1-2 every 6 hours as needed.  Over the weekend, I asked she take pain medication as she had been (2 q4h prn - max of 12/d, acetaminophen = 3900mg  --> would use 24 pills on Sat+Sun, taking an extra 8 tablets).  I will call her on Monday to see how her BP and pain are, and discuss situation with Dr. [name]  Other plans: If pressure remains elevated, other etiologies may be decomponsated dCHF (though does not appear fluid overloaded on exam) vs infection (though stump doesn't seem infected and no other s/s of infection) vs progressive hypertensive disease.  ADDENDUM: Patient's BP remains elevated - 200/130 per Arville Go RN.  Pt remains asymptomatic.  RN also reports 5lb weight gain.  I asked patient to increase lasix to 80mg  BID and start Kdur 6mEq daily (most recent K=3.5, sent to [facility] on [address]).  She should also go back percocet 1-2 q6h prn, I will let Dr. [name] know she took 8 extra pills.  Patient to be seen in Clinic on Wed. Will need BMET and pBNP

## 2013-12-30 NOTE — Patient Instructions (Signed)
-  Over the weekend, take your pain medication as you had been taking it before (2 pills every 4 hours as needed) - I will call you Monday to check on your blood pressure and pain level.  I will talk to Dr. [name] about covering you with extra pills once your run out of this month's supply.  Please be sure to bring all of your medications with you to every visit.  Should you have any new or worsening symptoms, please be sure to call the clinic at [phone].

## 2013-12-30 NOTE — Progress Notes (Signed)
Subjective:   [name] female   DOB: 1961-08-01 53 y.o.   MRN: [id number]  Chief Complaint  Patient presents with  . Hypertension    BP higher this week - doing well.    HPI: [name] is a 53 y.o. woman with h/o DM, HLD, HTN, COPD, PVD, dCHF who presents for the above complaint.  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 She is stressed but not "that" stressed. Has decreased her salt intake. Hadn't checked BP in a while prior to Tuesday. She is compliant with her atenolol, benazepril and lasix.  She is s/p BKA in 09/2013, and notes she has been having quite a bit of pain of her stump, as well as her fibromyalgia.  She reports that the pain in her stump is actually where she has already healed, not over what is still healing.  For pain, she is taking lyrica and percocet.  She left her SNF on 11/26, and was prescribed #360 percocet, was taking 2 q4h prn.   She was seen by Jennifer [name] on Monday, and reports that he "cut around it", which to me sounds like the stump was recently debrided.  She is on an antibiotic by Sharol [name] (Doxycycline?), and is scheduled to see him again on Feb 3rd.  She cleans and dresses her wound herself.  Increased swelling in hands and legs, no SOB, doesn't feel any different  6 loose stools isolated to yesterday, 1 loose stool today.   Review of Systems: Constitutional: Denies fever, chills, diaphoresis, appetite change and change in energy.  HEENT: Denies photophobia, eye pain, redness, hearing loss, ear pain, congestion, sore throat, rhinorrhea, sneezing, mouth sores, trouble swallowing, neck pain, neck stiffness and tinnitus.  Respiratory: Denies SOB, DOE, cough, chest tightness, and wheezing.  Cardiovascular: Denies chest pain, palpitations and leg swelling.  Gastrointestinal: Denies nausea, vomiting, abdominal  pain, constipation,blood in stool and abdominal distention.  Genitourinary: Denies dysuria, urgency, frequency, hematuria, flank pain and difficulty urinating.  Neurological: Denies dizziness, seizures, syncope, weakness, lightheadedness, numbness and headaches.    Past Medical History  Diagnosis Date  . Depression   . GERD (gastroesophageal reflux disease)   . Hyperlipidemia   . Hypertension   . Glaucoma   . PVD (peripheral vascular disease) with claudication     Stents to bilateral common iliac arteries (left 2005, right 2008), on chronic plavix  . Hyperplastic colon polyp 12/2010    Per colonoscopy (12/2010) - Dr. Deatra [name]  . Asthma   . COPD 01/08/2007    PFT's 05/2007 : FEV1/FVC 82, FEV1 64% pred, FEF 25-75% 40% predicted, 16% improvement in FEV1 with bronchodilators.     . Tobacco abuse   . Chronic osteomyelitis of foot     chronic, right secondary to diabetic foot ulcers  . Polymicrobial bacterial infection 01/2013    GBS and S. aureus bacteremia // Source likely infected diabetic foot ulcer  . 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 Blood cultures 01/26/13 S. Aureus and GBS. Blood cultures Feb 6th, 8th, and 9th and March were negative.Repeat TEE 3/20 negative for vegitations  . 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 Chronic diastolic heart failure     grade 2 per 2D echocardiogram (01/2013)  . Ulcer of foot,  chronic     Left. No OM per MRI (01/2013)  . DVT of upper extremity (deep vein thrombosis) 03/11/2013    Secondary to PICC line. Right brachial vein, diagnosed on 03/10/2013 Coumadin for 3 months. End date 06/10/2013   . Chronic pain syndrome 12/03/2011    Likely secondary to depression, "fibromyalgia", neuropathy, and obesity. Lumbar MRI 2014 no sig change from prior (2008) : Stable hypertrophic facet disease most notable at L4-5. Stable shallow left foraminal/extraforaminal disc protrusion at L4-5. No  direct neural compression.      . Environmental allergies     Hx: of  . Fatty liver 2003    observed on ultrasound abdomen  . CHF (congestive heart failure)   . Anginal pain     '3' of 10 ischemia ruled out 9/9   . Fibromyalgia   . Chronic bronchitis     "I get it alot" (09/28/2013)  . Exertional shortness of breath   . Rheumatoid arthritis   . Arthritis of lumbar spine   . Chronic lower back pain   . Diabetic peripheral neuropathy   . Type II diabetes mellitus with peripheral circulatory disorders, uncontrolled DX: 1993    Insulin dep. Poor control. Complicated by diabetic foot ulcer and diabetic eye disease.    . 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 2/2 tobacco use, DM, and cont weight bearing on surgical wound and developed gangrene    Current Outpatient Prescriptions  Medication Sig Dispense Refill  . ADVAIR DISKUS 250-50 MCG/DOSE AEPB TAKE 1 INHALATION BY MOUTH TWICE DAILY  180 each  3  . albuterol (PROVENTIL HFA;VENTOLIN HFA) 108 (90 BASE) MCG/ACT inhaler Inhale 2 puffs into the lungs every 6 (six) hours as needed for wheezing or shortness of breath.      Marland Kitchen albuterol (PROVENTIL) (5 MG/ML) 0.5% nebulizer solution Take 2.5 mg by nebulization every 6 (six) hours as needed for wheezing or shortness of breath.      Marland Kitchen atenolol (TENORMIN) 100 MG tablet Take 100 mg by mouth daily.      . baclofen (LIORESAL) 10 MG tablet Take 1 tablet (10 mg total) by mouth 3 (three) times daily as needed for muscle spasms.  20 tablet  1  . benazepril (LOTENSIN) 40 MG tablet Take 40 mg by mouth daily.      Marland Kitchen buPROPion (WELLBUTRIN SR) 150 MG 12 hr tablet Take 150 mg by mouth 2 (two) times daily.      . diphenhydrAMINE (BENADRYL) 25 MG tablet Take 25 mg by mouth every 6 (six) hours.       Marland Kitchen esomeprazole (NEXIUM) 20 MG capsule Take 40 mg by mouth every morning.       . furosemide (LASIX) 40 MG tablet Take 40 mg by mouth See admin  instructions. Take 40 mg in the morning and 40 mg in the afternoon      . Insulin Lispro Prot & Lispro (HUMALOG MIX 75/25 KWIKPEN) (75-25) 100 UNIT/ML SUPN Inject 135 units in the AM before breakfast and 70 units in the PM before evening meal  75 mL  5  . insulin lispro protamine-lispro (HUMALOG 75/25) (75-25) 100 UNIT/ML SUSP injection Inject 75-140 Units into the skin 2 (two) times daily with a meal. Inject 135 units in the morning and 70 units in the evening  10 mL    . metFORMIN (GLUCOPHAGE) 1000 MG tablet Take 1,000 mg by mouth 2 (two) times  daily with a meal.      . mupirocin cream (BACTROBAN) 2 % Apply 1 application topically 2 (two) times daily.  15 g  0  . oxyCODONE-acetaminophen (ROXICET) 5-325 MG per tablet Take 2 tablets by mouth every 6 (six) hours as needed.  240 tablet  0  . pravastatin (PRAVACHOL) 40 MG tablet Take 40 mg by mouth daily.      . pregabalin (LYRICA) 200 MG capsule Take 200 mg by mouth 3 (three) times daily.      Marland Kitchen PROAIR HFA 108 (90 BASE) MCG/ACT inhaler INHALE 2 PUFFS BY MOUTH EVERY 6 HOURS AS NEEDED FOR WHEEZING  25.5 g  3  . travoprost, benzalkonium, (TRAVATAN) 0.004 % ophthalmic solution 1 drop at bedtime.      . traZODone (DESYREL) 100 MG tablet Take 100 mg by mouth at bedtime.       No current facility-administered medications for this visit.   Family History  Problem Relation Age of Onset  . Diverticulosis Mother   . Diabetes Mother   . Hypertension Mother   . Congestive Heart Failure Mother   . Asthma Father   . CAD Sister 64    MI at age 64 per patient.  However, she has not had a stent or CABG.    History   Social History  . Marital Status: Single    Spouse Name: N/A    Number of Children: 2  . Years of Education: college   Occupational History  . Disability     previously worked as a IT sales professional History Main Topics  . Smoking status: Former Smoker -- 0.50 packs/day for 44 years    Types: Cigarettes    Quit date: 10/03/2013  .  Smokeless tobacco: Never Used  . Alcohol Use: No  . Drug Use: No     Comment: 09/28/2013 "no marijuana since 2011, no crack/cocaine 1989"  . Sexual Activity: Not Currently     Comment: states she is currently trying to quit smoking   Other Topics Concern  . Not on file   Social History Narrative   On disability. Lives with son in [place]. Formerly worked as Training and development officer.    Boyfriend passed away stage 4 cancer March 08, 2013.    Objective:  Physical Exam: Filed Vitals:   12/30/13 1554  BP: 176/87  Pulse: 73  Temp: 98 F (36.7 C)  TempSrc: Oral  SpO2: 98%   General: in no acute distress, appears as stated age 40: PERRL, EOMI, no scleral icterus Cardiac: RRR Pulm: clear to auscultation bilaterally, moving normal volumes of air Abd: soft, nontender, nondistended, BS present, obese Ext: +nonpitting edema of right LE, left leg s/p BKA, wound is healing with subQ fat and granulation tissue apparent around perimeter (largest wound is about 6cm in diameter), not malodorous or purulent  Neuro: alert and oriented X3, cranial nerves II-XII grossly intact  Assessment & Plan:  Case and care discussed with Dr. Marinda Elk.  Please see problem oriented charting for further details. Patient to return on 01/10/13 for f/u with PCP.

## 2013-12-30 NOTE — Progress Notes (Signed)
Case discussed with Dr. Sharda soon after the resident saw the patient.  We reviewed the resident's history and exam and pertinent patient test results.  I agree with the assessment, diagnosis, and plan of care documented in the resident's note. 

## 2013-12-30 NOTE — Telephone Encounter (Signed)
Pt calls and states she has elevated BPs for several days, today 201/195, yesterday when [facility] visited bp 182/138. She denies h/a, N&V, chest pain, weakness. She is ask to come in at 36, dr Burnard Bunting, and to call 911 for any worsening. She is agreeable

## 2014-01-02 ENCOUNTER — Telehealth: Payer: Self-pay | Admitting: *Deleted

## 2014-01-02 MED ORDER — POTASSIUM CHLORIDE ER 20 MEQ PO TBCR: 40.0000 meq | EXTENDED_RELEASE_TABLET | Freq: Every day | ORAL | Status: DC

## 2014-01-02 NOTE — Addendum Note (Signed)
Addended by: Othella Boyer on: 01/02/2014 12:22 PM   Modules accepted: Orders

## 2014-01-02 NOTE — Telephone Encounter (Signed)
Talked with pt  For BP reading - 01/02/14 ( before med ) 201/89, 01/01/14 164/ 113 and 12/31/13 148/ 103. Results given to Dr Burnard Bunting - in clinic today. Hilda Blades Shaheer Bonfield RN 01/02/14 10AM

## 2014-01-02 NOTE — Telephone Encounter (Signed)
Dr Burnard Bunting talked with home visiting nurse around lunch time.

## 2014-01-04 ENCOUNTER — Ambulatory Visit: Payer: Medicare Other | Admitting: Internal Medicine

## 2014-01-04 ENCOUNTER — Telehealth: Payer: Self-pay | Admitting: *Deleted

## 2014-01-04 ENCOUNTER — Other Ambulatory Visit: Payer: Self-pay | Admitting: Internal Medicine

## 2014-01-04 NOTE — Telephone Encounter (Signed)
[facility] pharmacist calls and ask if pt is to get K+ or not, states she has recent script from dr Burnard Bunting but has a note from dr Software engineer that pt is not to have K+, please advise- note to dr Software engineer and dr Burnard Bunting

## 2014-01-04 NOTE — Telephone Encounter (Signed)
Pt called to ask about appointment on 1/16.  Pt was seen in clinic on 1/9 and is to f/u on 1/16.  Pt has a Emergency planning/management officer ( from [place]) that will be at the home on Friday 1/16.  She can do BP readings and draw blood. Pt wants to know if she still needs to be seen in the clinic or can we call orders in to Nurse.  Adalberto Ill, RN @ [phone] Office # [phone] ( leave order with Ulis Rias)

## 2014-01-04 NOTE — Telephone Encounter (Signed)
Doesn't have to be seen in clinic unless she becomes symptomatic.  Will need BMET, weight and BP check.  Thanks!

## 2014-01-04 NOTE — Telephone Encounter (Signed)
[facility] called and orders given. Pt called and message left with this information.

## 2014-01-04 NOTE — Telephone Encounter (Signed)
Should take potassium because I doubled her lasix dose and her K runs low normal, thanks!

## 2014-01-05 ENCOUNTER — Other Ambulatory Visit: Payer: Self-pay | Admitting: Internal Medicine

## 2014-01-05 NOTE — Telephone Encounter (Signed)
Agree with Dr Burnard Bunting. My note was before Dr Raenette Rover visit.

## 2014-01-05 NOTE — Telephone Encounter (Signed)
Called [facility] and verified

## 2014-01-06 ENCOUNTER — Telehealth: Payer: Self-pay | Admitting: *Deleted

## 2014-01-06 ENCOUNTER — Ambulatory Visit: Payer: Medicare Other | Admitting: Internal Medicine

## 2014-01-06 NOTE — Telephone Encounter (Signed)
I filled on 1/14 

## 2014-01-06 NOTE — Telephone Encounter (Signed)
I filled on 1/14

## 2014-01-06 NOTE — Telephone Encounter (Signed)
Pt called about directions on Lasix. Home nurse should come this AM. To cont with same dosage till home nurse calls BP, wt and lab results done today per Dr Burnard Bunting . Pt states she check BP herself  this AM 165/112.Pt states she feels good. Hilda Blades Steele Stracener RN 01/06/14 10:45AM

## 2014-01-10 ENCOUNTER — Encounter: Payer: Medicare Other | Admitting: Internal Medicine

## 2014-01-14 ENCOUNTER — Other Ambulatory Visit: Payer: Self-pay | Admitting: Internal Medicine

## 2014-01-16 NOTE — Telephone Encounter (Signed)
Cancelled last 3 appt. She needs a K level before refilling this. She is followed by [facility]. Can [facility] help arrange F/U or get lab draw?

## 2014-01-20 ENCOUNTER — Encounter: Payer: Self-pay | Admitting: Internal Medicine

## 2014-01-20 ENCOUNTER — Ambulatory Visit (INDEPENDENT_AMBULATORY_CARE_PROVIDER_SITE_OTHER): Payer: Medicare Other | Admitting: Internal Medicine

## 2014-01-20 VITALS — BP 161/76 | HR 76 | Temp 97.4°F

## 2014-01-20 DIAGNOSIS — G894 Chronic pain syndrome: Secondary | ICD-10-CM

## 2014-01-20 DIAGNOSIS — I10 Essential (primary) hypertension: Secondary | ICD-10-CM

## 2014-01-20 DIAGNOSIS — E1159 Type 2 diabetes mellitus with other circulatory complications: Secondary | ICD-10-CM

## 2014-01-20 LAB — BASIC METABOLIC PANEL
BUN: 13 mg/dL (ref 6–23)
CO2: 31 mEq/L (ref 19–32)
Calcium: 8.5 mg/dL (ref 8.4–10.5)
Chloride: 105 mEq/L (ref 96–112)
Creat: 0.76 mg/dL (ref 0.50–1.10)
Glucose, Bld: 112 mg/dL — ABNORMAL HIGH (ref 70–99)
Potassium: 3.7 mEq/L (ref 3.5–5.3)
Sodium: 142 mEq/L (ref 135–145)

## 2014-01-20 MED ORDER — FUROSEMIDE 80 MG PO TABS: 160.0000 mg | ORAL_TABLET | Freq: Every day | ORAL | Status: DC

## 2014-01-20 MED ORDER — OXYCODONE-ACETAMINOPHEN 5-325 MG PO TABS: 2.0000 | ORAL_TABLET | ORAL | Status: DC | PRN

## 2014-01-20 NOTE — Assessment & Plan Note (Addendum)
BP Readings from Last 3 Encounters:  01/20/14 161/76  12/30/13 176/87  11/19/13 155/74    Lab Results  Component Value Date   NA 142 11/19/2013   K 3.5 11/19/2013   CREATININE 0.80 11/19/2013    Assessment: Blood pressure control: mildly elevated Progress toward BP goal:  improved  Plan: Medications:  continue current medications - atenolol 100 daily, benazepril 40 daily, lasix 80 bid --> will change to 160 daily to help with urinary frequency; cont K supp for now Educational resources provided: brochure;handout;video Other plans: BMET today  ADDENDUM: 01/23/14 Labs from last week show stable renal fxn with K 3.7 --> continue K supp 76mEq per day --> refill sent in for 3 months total

## 2014-01-20 NOTE — Progress Notes (Signed)
Subjective:   [name] female   DOB: 06/29/1961 53 y.o.   MRN: [id number]  Chief Complaint  Patient presents with  . Follow-up    Discuss Lasix. CBG this AM 117.    HPI: Jennifer [name] is a 53 y.o. woman with h/o HTN, DM, PVD, dCHF, s/p L BKA who presents for follow up. I saw patient on 12/30/13 for elevated BP - trial of more intensive pain regimen did not reduce BP, so we increased lasix to 80 bid.  BP today by [facility] was 130/80 per patient.   Continues to have a lot of pain in the left leg, feels like her toes are hurting (on amputated leg), after she puts dressing on stump, it feels like she taped her toes shut. She reports pain medication doesn't not help much, it keeps it at [place]. Taking 2 tab q6h, #240/month  Review of Systems: Constitutional: Denies fever, chills, diaphoresis, appetite change and fatigue.  HEENT: Denies photophobia, eye pain, redness, hearing loss, ear pain, congestion, sore throat, rhinorrhea, sneezing, mouth sores, trouble swallowing, neck pain, neck stiffness and tinnitus.  Respiratory: Denies SOB, DOE, cough, chest tightness, and wheezing.  Cardiovascular: Denies chest pain, palpitations and leg swelling.  Gastrointestinal: Denies nausea, vomiting, abdominal pain, diarrhea (one episode 2 Sat ago), constipation,blood in stool and abdominal distention.  Genitourinary: Denies dysuria, urgency, hematuria, flank pain and difficulty urinating. +polyuria due to lasix Musculoskeletal: phantom limb pain per HPI Skin: Denies pallor, rash and wound.  Neurological: Denies dizziness, seizures, syncope, weakness, lightheadedness, numbness and headaches.    Past Medical History  Diagnosis Date  . Depression   . GERD (gastroesophageal reflux disease)   . Hyperlipidemia   . Hypertension   . Glaucoma   . PVD (peripheral vascular disease) with claudication     Stents to bilateral common iliac arteries (left 2005, right 2008), on chronic plavix  .  Hyperplastic colon polyp 12/2010    Per colonoscopy (12/2010) - Dr. Deatra Ina  . Asthma   . COPD 01/08/2007    PFT's 05/2007 : FEV1/FVC 82, FEV1 64% pred, FEF 25-75% 40% predicted, 16% improvement in FEV1 with bronchodilators.     . Tobacco abuse   . Chronic osteomyelitis of foot     chronic, right secondary to diabetic foot ulcers  . Polymicrobial bacterial infection 01/2013    GBS and S. aureus bacteremia // Source likely infected diabetic foot ulcer  . 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 Blood cultures 01/26/13 S. Aureus and GBS. Blood cultures Feb 6th, 8th, and 9th and March were negative.Repeat TEE 3/20 negative for vegitations  . 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 Chronic diastolic heart failure     grade 2 per 2D echocardiogram (01/2013)  . Ulcer of foot, chronic     Left. No OM per MRI (01/2013)  . DVT of upper extremity (deep vein thrombosis) 03/11/2013    Secondary to PICC line. Right brachial vein, diagnosed on 03/10/2013 Coumadin for 3 months. End date 06/10/2013   . Chronic pain syndrome 12/03/2011    Likely secondary to depression, "fibromyalgia", neuropathy, and obesity. Lumbar MRI 2014 no sig change from prior (2008) : Stable hypertrophic facet disease most notable at L4-5. Stable shallow left foraminal/extraforaminal disc protrusion at L4-5. No direct neural compression.      . Environmental allergies     Hx: of  . Fatty liver 2003  observed on ultrasound abdomen  . CHF (congestive heart failure)   . Anginal pain     '3' of 10 ischemia ruled out 9/9   . Fibromyalgia   . Chronic bronchitis     "I get it alot" (09/28/2013)  . Exertional shortness of breath   . Rheumatoid arthritis   . Arthritis of lumbar spine   . Chronic lower back pain   . Diabetic peripheral neuropathy   . Type II diabetes mellitus with peripheral circulatory disorders, uncontrolled DX: 1993    Insulin dep. Poor control.  Complicated by diabetic foot ulcer and diabetic eye disease.    . 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 2/2 tobacco use, DM, and cont weight bearing on surgical wound and developed gangrene    Current Outpatient Prescriptions  Medication Sig Dispense Refill  . ADVAIR DISKUS 250-50 MCG/DOSE AEPB TAKE 1 INHALATION BY MOUTH TWICE DAILY  180 each  3  . albuterol (PROVENTIL HFA;VENTOLIN HFA) 108 (90 BASE) MCG/ACT inhaler Inhale 2 puffs into the lungs every 6 (six) hours as needed for wheezing or shortness of breath.      Marland Kitchen albuterol (PROVENTIL) (5 MG/ML) 0.5% nebulizer solution Take 2.5 mg by nebulization every 6 (six) hours as needed for wheezing or shortness of breath.      Marland Kitchen atenolol (TENORMIN) 100 MG tablet Take 100 mg by mouth daily.      . baclofen (LIORESAL) 10 MG tablet TAKE 1 TABLET BY MOUTH 3 TIMES A DAY AS NEEDED FOR MUSCLE SPASMS  20 each  1  . benazepril (LOTENSIN) 40 MG tablet Take 40 mg by mouth daily.      Marland Kitchen buPROPion (WELLBUTRIN SR) 150 MG 12 hr tablet Take 150 mg by mouth 2 (two) times daily.      . diphenhydrAMINE (BENADRYL) 25 MG tablet Take 25 mg by mouth every 6 (six) hours.       Marland Kitchen esomeprazole (NEXIUM) 20 MG capsule Take 40 mg by mouth every morning.       . furosemide (LASIX) 40 MG tablet Take 40 mg by mouth See admin instructions. Take 40 mg in the morning and 40 mg in the afternoon      . Insulin Lispro Prot & Lispro (HUMALOG MIX 75/25 KWIKPEN) (75-25) 100 UNIT/ML SUPN Inject 135 units in the AM before breakfast and 70 units in the PM before evening meal  75 mL  5  . insulin lispro protamine-lispro (HUMALOG 75/25) (75-25) 100 UNIT/ML SUSP injection Inject 75-140 Units into the skin 2 (two) times daily with a meal. Inject 135 units in the morning and 70 units in the evening  10 mL    . ipratropium (ATROVENT) 0.02 % nebulizer solution USE 1 VIAL VIA NEBULIZER EVERY 6 HOURS AS NEEDED FOR WHEEZING  75 mL  2    . metFORMIN (GLUCOPHAGE) 1000 MG tablet Take 1,000 mg by mouth 2 (two) times daily with a meal.      . mupirocin cream (BACTROBAN) 2 % Apply 1 application topically 2 (two) times daily.  15 g  0  . oxyCODONE-acetaminophen (ROXICET) 5-325 MG per tablet Take 2 tablets by mouth every 6 (six) hours as needed.  240 tablet  0  . Potassium Chloride ER 20 MEQ TBCR Take 40 mEq by mouth daily.  30 tablet  0  . pravastatin (PRAVACHOL) 40 MG tablet Take 40 mg by mouth daily.      Marland Kitchen  pregabalin (LYRICA) 200 MG capsule Take 200 mg by mouth 3 (three) times daily.      Marland Kitchen PROAIR HFA 108 (90 BASE) MCG/ACT inhaler INHALE 2 PUFFS BY MOUTH EVERY 6 HOURS AS NEEDED FOR WHEEZING  25.5 g  3  . travoprost, benzalkonium, (TRAVATAN) 0.004 % ophthalmic solution 1 drop at bedtime.      . traZODone (DESYREL) 100 MG tablet Take 100 mg by mouth at bedtime.       No current facility-administered medications for this visit.   Family History  Problem Relation Age of Onset  . Diverticulosis Mother   . Diabetes Mother   . Hypertension Mother   . Congestive Heart Failure Mother   . Asthma Father   . CAD Sister 38    MI at age 55 per patient.  However, she has not had a stent or CABG.    History   Social History  . Marital Status: Single    Spouse Name: N/A    Number of Children: 2  . Years of Education: college   Occupational History  . Disability     previously worked as a IT sales professional History Main Topics  . Smoking status: Former Smoker -- 0.50 packs/day for 44 years    Types: Cigarettes    Quit date: 10/03/2013  . Smokeless tobacco: Never Used  . Alcohol Use: No  . Drug Use: No     Comment: 09/28/2013 "no marijuana since 2011, no crack/cocaine 1989"  . Sexual Activity: Not Currently     Comment: states she is currently trying to quit smoking   Other Topics Concern  . Not on file   Social History Narrative   On disability. Lives with son in [place]. Formerly worked as Training and development officer.    Boyfriend passed away  stage 4 cancer 2013/03/14.    Objective:  Physical Exam: Filed Vitals:   01/20/14 1337  BP: 161/76  Pulse: 76  Temp: 97.4 F (36.3 C)  TempSrc: Oral  SpO2: 99%   HEENT: PERRL, EOMI, no scleral icterus Cardiac: RRR, no rubs, murmurs or gallops Pulm: clear to auscultation bilaterally, moving normal volumes of air Abd: soft, nontender, nondistended, BS present Ext: warm and well perfused, 1+ pretibial pitting edema (right), left BKA with wrapping in past Neuro: alert and oriented X3, cranial nerves II-XII grossly intact  Assessment & Plan:  Case and care discussed with Dr. [name].  Please see problem oriented charting for further details. Patient to return on 02/07/14 for appt with PCP.

## 2014-01-20 NOTE — Assessment & Plan Note (Signed)
Taking 140u qAM and 75u qPM and metformin 1000 bid - no s/s hypo/hyperglycemia, last A1c 6.5

## 2014-01-20 NOTE — Patient Instructions (Addendum)
-  I am referring you to physical therapy to see if they can help with you pain and function - maybe we can try kinesiotape  -I am increasing your percocet to 2 pills every 4 hours as needed, but you have to be seen in clinic MONTHLY and participate in physical therapy -- we will not change the dosing on your contract until we do this consistently.  Please be sure to bring all of your medications with you to every visit.  Should you have any new or worsening symptoms, please be sure to call the clinic at [phone].

## 2014-01-20 NOTE — Assessment & Plan Note (Signed)
Exacerbated by new phantom limb pain, that was responsive to increased percocet of 2 tab q4h prn after d/c from rehab.  Will increase Percocet 5-325, 2 tab q4h prn #360 for this month (script printed and given to her in clinic today), with the understanding that patient will participate in PT and make monthly clinic appts.  I did not create a contract yet for patient, as I want her to demonstrate consistency before we agree long term to the increased dose.  PT may also offer kinesiotherapy, which may be beneficial for phantom limb pain.

## 2014-01-21 NOTE — Progress Notes (Signed)
Case discussed with Dr. Sharda soon after the resident saw the patient.  We reviewed the resident's history and exam and pertinent patient test results.  I agree with the assessment, diagnosis, and plan of care documented in the resident's note. 

## 2014-01-23 MED ORDER — POTASSIUM CHLORIDE CRYS ER 20 MEQ PO TBCR: 40.0000 meq | EXTENDED_RELEASE_TABLET | Freq: Every day | ORAL | Status: DC

## 2014-01-23 NOTE — Addendum Note (Signed)
Addended by: Othella Boyer on: 01/23/2014 01:29 PM   Modules accepted: Orders

## 2014-01-25 ENCOUNTER — Telehealth: Payer: Self-pay | Admitting: *Deleted

## 2014-01-25 NOTE — Telephone Encounter (Signed)
Denitka with [facility] [phone] called about pt -right ankle is swollen, pt did not take Lasix 01/22/14 and saw pt smoking -  BP 150/100 during home visit 01/23/14. Left message for home nurse to call clinic. Hilda Blades Lawerence Dery RN 01/25/14 11:20AM

## 2014-01-25 NOTE — Telephone Encounter (Signed)
Talked with pt - doing well - good report from ortho doctor recently. Unable to do wt - scale is broken. Still smoking a little. Will call clinic if needs to be seen earlier then sch appt.

## 2014-01-25 NOTE — Telephone Encounter (Signed)
She needs to take the lasix as prescribed.  IF she can, she needs to weigh herself daily and record. She needs to stop smoking She has appt with me soon. Can be seen earlier if needed.

## 2014-02-07 ENCOUNTER — Encounter: Payer: Medicare Other | Admitting: Internal Medicine

## 2014-02-15 NOTE — Addendum Note (Signed)
Addended by: Hulan Fray on: 02/15/2014 03:50 PM   Modules accepted: Orders

## 2014-02-17 ENCOUNTER — Other Ambulatory Visit: Payer: Self-pay | Admitting: *Deleted

## 2014-02-17 DIAGNOSIS — G894 Chronic pain syndrome: Secondary | ICD-10-CM

## 2014-02-20 ENCOUNTER — Other Ambulatory Visit: Payer: Self-pay | Admitting: Internal Medicine

## 2014-02-20 MED ORDER — OXYCODONE-ACETAMINOPHEN 5-325 MG PO TABS: 2.0000 | ORAL_TABLET | ORAL | Status: DC | PRN

## 2014-02-20 NOTE — Telephone Encounter (Signed)
Pt aware Rx is ready and aware to keep appt per Dr [name].

## 2014-02-20 NOTE — Telephone Encounter (Signed)
She must keep her appt to get any more refills

## 2014-02-21 ENCOUNTER — Other Ambulatory Visit: Payer: Self-pay | Admitting: Internal Medicine

## 2014-02-21 NOTE — Telephone Encounter (Signed)
Both were refilled yesterday.

## 2014-02-21 NOTE — Telephone Encounter (Signed)
Pls phone in the Lyrica. thanks

## 2014-02-27 ENCOUNTER — Encounter: Payer: Self-pay | Admitting: Internal Medicine

## 2014-02-28 ENCOUNTER — Encounter: Payer: Self-pay | Admitting: Internal Medicine

## 2014-02-28 ENCOUNTER — Ambulatory Visit (INDEPENDENT_AMBULATORY_CARE_PROVIDER_SITE_OTHER): Payer: Medicare Other | Admitting: Internal Medicine

## 2014-02-28 VITALS — BP 167/87 | HR 79 | Temp 96.6°F | Wt 274.2 lb

## 2014-02-28 DIAGNOSIS — Z0000 Encounter for general adult medical examination without abnormal findings: Secondary | ICD-10-CM

## 2014-02-28 DIAGNOSIS — I10 Essential (primary) hypertension: Secondary | ICD-10-CM

## 2014-02-28 DIAGNOSIS — G894 Chronic pain syndrome: Secondary | ICD-10-CM

## 2014-02-28 DIAGNOSIS — R269 Unspecified abnormalities of gait and mobility: Secondary | ICD-10-CM

## 2014-02-28 DIAGNOSIS — I5032 Chronic diastolic (congestive) heart failure: Secondary | ICD-10-CM

## 2014-02-28 DIAGNOSIS — S88119A Complete traumatic amputation at level between knee and ankle, unspecified lower leg, initial encounter: Secondary | ICD-10-CM

## 2014-02-28 DIAGNOSIS — E1159 Type 2 diabetes mellitus with other circulatory complications: Secondary | ICD-10-CM

## 2014-02-28 MED ORDER — OXYCODONE-ACETAMINOPHEN 5-325 MG PO TABS: 2.0000 | ORAL_TABLET | ORAL | Status: DC | PRN

## 2014-02-28 NOTE — Assessment & Plan Note (Signed)
OSA sxs - + poor sleep. No sig naps, no snoring, no AM HA.

## 2014-02-28 NOTE — Assessment & Plan Note (Signed)
Remains a bit elevated. Dr Burnard Bunting has been working on this with pt. She didn't take meds today. Misses lasix up to 2 times weekly. States BP yesterday per home health was 137/84 - at goal. On Lotensin 40, atenolol 100, and Lasix 160 QD (can't tolerate BID dosing and also using for volume). Leave meds as is, recheck in one month.   BP Readings from Last 3 Encounters:  02/28/14 167/87  01/20/14 161/76  12/30/13 176/87    Lab Results  Component Value Date   NA 142 01/20/2014   K 3.7 01/20/2014   CREATININE 0.76 01/20/2014    Assessment: Blood pressure control: mildly elevated Progress toward BP goal:  unchanged Comments: See above    Plan: Medications:  continue current medications Educational resources provided:   Self management tools provided:   Other plans: See above

## 2014-02-28 NOTE — Assessment & Plan Note (Signed)
Her A1C is at goal. She denies lows since Jan. She thinks her CBG's have lowered and is pleased with them. She is able to tell me her doses and how she draws them up. Her calc basel should be 60 but she is on 118 basal - her infxn is gone so likely either non compliance (denies) or sig resistance. Foot exam today.  Lab Results  Component Value Date   HGBA1C 6.5 12/30/2013   HGBA1C 8.6* 10/14/2013   HGBA1C 8.0 09/16/2013     Assessment: Diabetes control: good control (HgbA1C at goal) Progress toward A1C goal:  at goal Comments: See above  Plan: Medications:  continue current medications Home glucose monitoring: Frequency: 3 times a day Timing: before meals Instruction/counseling given: discussed foot care Educational resources provided:   Self management tools provided:   Other plans: See above

## 2014-02-28 NOTE — Assessment & Plan Note (Signed)
Her pain is 2/2 phantom pain, fibromyalgia, neuropathy, obesity, and depression. In addition to her opioid, she is on Trazadone, bupropion, baclofen, lyrica. Her percocet 5/325 has been increased up to #360 per month - ave 12 per day. She takes it Q4 hrs but doesn't wake up to take a dose. She doesn't appear over sedated today - a concern in past. States hurts all over - skin on R thigh painful to touch, R foot, and fingers are the worst but hurts all over. Took her AM dose already today. She gets PT twice weekly with [facility] in home.  Plan : gave printed Rx as will be due early April.  UDS Monthly appt with me Cont PT Offer Narcan next appt Cont meds

## 2014-02-28 NOTE — Progress Notes (Signed)
   Subjective:    [name], female    DOB: Apr 28, 1961, 53 y.o.   MRN: [id number]  HPI  Please see the A&P for the status of the pt's chronic medical problems.  Review of Systems  Constitutional: Negative for unexpected weight change.  Respiratory: Negative for shortness of breath.   Cardiovascular: Negative for chest pain.  Gastrointestinal: Negative for nausea, abdominal pain, diarrhea and constipation.  Endocrine: Positive for polyuria.  Genitourinary: Positive for urgency, frequency and difficulty urinating. Negative for decreased urine volume.  Musculoskeletal: Positive for arthralgias, back pain, gait problem and myalgias.  Skin: Positive for wound.  Neurological: Positive for weakness. Negative for headaches.  Psychiatric/Behavioral: Positive for sleep disturbance.       Objective:   Physical Exam  Constitutional: She is oriented to person, place, and time. She appears well-developed and well-nourished. No distress.  Not lethargic today  HENT:  Head: Normocephalic and atraumatic.  Right Ear: External ear normal.  Left Ear: External ear normal.  Nose: Nose normal.  Eyes: Conjunctivae and EOM are normal.  Pulmonary/Chest: Effort normal.  Musculoskeletal:  S/p L BKA  Neurological: She is alert and oriented to person, place, and time.  Skin: She is not diaphoretic.  Psychiatric: She has a normal mood and affect. Her behavior is normal. Judgment and thought content normal.          Assessment & Plan:

## 2014-02-28 NOTE — Patient Instructions (Signed)
General Instructions:  1. Please see me end of April 2. I gave you paper prescription for your pain med 3. I will take with Elta Guadeloupe about a power chair 4. Talk to Lattie Haw about your scales  Treatment Goals:  Goals (1 Years of Data) as of 02/28/14         01/20/14 12/30/13 11/19/13 11/19/13 10/17/13     Blood Pressure    . Blood Pressure < 140/90  161/76 176/87 155/74 163/51 99/69     Lifestyle    . Prevent Falls          . Quit smoking / using tobacco           Result Component    . HEMOGLOBIN A1C < 7.0   6.5       . LDL CALC < 100            Progress Toward Treatment Goals:  Treatment Goal 02/28/2014  Hemoglobin A1C at goal  Blood pressure unchanged  Stop smoking -  Prevent falls deteriorated    Self Care Goals & Plans:  Self Care Goal 01/20/2014  Manage my medications take my medicines as prescribed; bring my medications to every visit; refill my medications on time; follow the sick day instructions if I am sick  Monitor my health keep track of my blood glucose; keep track of my blood pressure; keep track of my weight; check my feet daily  Eat healthy foods eat more vegetables; eat fruit for snacks and desserts; eat foods that are low in salt; eat smaller portions  Be physically active find an activity I enjoy  Stop smoking -  Prevent falls -  Meeting treatment goals maintain the current self-care plan    Home Blood Glucose Monitoring 02/28/2014  Check my blood sugar 3 times a day  When to check my blood sugar before meals     Care Management & Community Referrals:  Referral 02/28/2014  Referrals made for care management support none needed  Referrals made to community resources -

## 2014-02-28 NOTE — Assessment & Plan Note (Signed)
Still seeing Dr Sharol Given. Getting [facility] and nurse.

## 2014-02-28 NOTE — Assessment & Plan Note (Signed)
Her scale is broke. She will ask Lattie Haw at [facility] about a new one on [place], She denies vol overload sxs.

## 2014-02-28 NOTE — Assessment & Plan Note (Addendum)
She is now s/p L BKA. She admits she has not developed the RLE or UE strength that she needs. She does use her hands to propel the [facility] unless she is holding something and then uses her R leg which is causing pain in the R knee. She has had sev falls. Her WC will enter all rooms except bathroom - has to stand up and use walker to hop to commode. Also has bedside toilet. She asked about hover round - I said I would talk to her PT as I doubt a Powerchair would fit into apt if manual isn't fitting. Has to use back entrance as no ramp on front and back requires long trip over tree roots. She seems to be getting a good attitude in terms of improving her mobility.   Fall Screening 08/02/2013 10/11/2013 12/30/2013 01/20/2014 02/28/2014  Falls in the past year? Yes Yes Yes No Yes  Number of falls in past year 2 or more 2 or more 2 or more - 2 or more  Was there an injury with Fall? Yes - - - -  Risk Factor Category  High Fall Risk High Fall Risk - - High Fall Risk     Assessment: Progress toward fall prevention goal:  deteriorated Comments: See above  Plan: Fall prevention plans: See above Educational resources provided:   Self management tools provided:       Addendum : [facility] just picked her up as client on 3/6. PT to go out first time today. Asked that she be assessed for powerchair.

## 2014-03-01 LAB — PRESCRIPTION ABUSE MONITORING 15P, URINE
Amphetamine/Meth: NEGATIVE ng/mL
Barbiturate Screen, Urine: NEGATIVE ng/mL
Benzodiazepine Screen, Urine: NEGATIVE ng/mL
Buprenorphine, Urine: NEGATIVE ng/mL
Cannabinoid Scrn, Ur: NEGATIVE ng/mL
Carisoprodol, Urine: NEGATIVE ng/mL
Cocaine Metabolites: NEGATIVE ng/mL
Creatinine, Urine: 174.99 mg/dL (ref >20.0)
Fentanyl, Ur: NEGATIVE ng/mL
Meperidine, Ur: NEGATIVE ng/mL
Methadone Screen, Urine: NEGATIVE ng/mL
Propoxyphene: NEGATIVE ng/mL
Tramadol Scrn, Ur: NEGATIVE ng/mL
Zolpidem, Urine: NEGATIVE ng/mL

## 2014-03-03 LAB — OPIATES/OPIOIDS (LC/MS-MS)
Codeine Urine: NEGATIVE ng/mL
Heroin (6-AM), UR: NEGATIVE ng/mL
Hydrocodone: NEGATIVE ng/mL
Hydromorphone: NEGATIVE ng/mL
Morphine Urine: NEGATIVE ng/mL
Norhydrocodone, Ur: NEGATIVE ng/mL
Noroxycodone, Ur: >2500 ng/mL
Oxycodone, ur: 3585 ng/mL
Oxymorphone: 157 ng/mL

## 2014-03-03 LAB — OXYCODONE, URINE (LC/MS-MS)
Noroxycodone, Ur: >2500 ng/mL
Oxycodone, ur: 3585 ng/mL
Oxymorphone: 157 ng/mL

## 2014-03-16 ENCOUNTER — Other Ambulatory Visit: Payer: Self-pay | Admitting: Internal Medicine

## 2014-03-16 NOTE — Telephone Encounter (Signed)
Rx called in 

## 2014-03-17 ENCOUNTER — Other Ambulatory Visit: Payer: Self-pay | Admitting: Internal Medicine

## 2014-04-13 ENCOUNTER — Telehealth: Payer: Self-pay | Admitting: *Deleted

## 2014-04-13 NOTE — Telephone Encounter (Signed)
Call from [facility], Physical Therapist with [facility]  - # [phone] or [phone]  Call is in regard to pt's request for electric wheel chair or scooter. Pt told Physical Therapist that Dr [name] wanted her to get the electric W/Chair for mobility outside the house. Pt has manual W/chair in home.   Insurance will not pay for a electric wheel chair out side of home if she is  able to get around in home with a manual w/chair.   Pt is able to transfer to commode, in and out of bed, without help.   He will return and see pt next week.  He found out that she may be able to rent a scooter but will have to pay out of pocket.  PT thinks that insurance Co may be thinking pt  will be able to get prosthesis in future and be able to walk.  He just wants to keep you informed

## 2014-04-14 NOTE — Telephone Encounter (Signed)
Pt had asked me for electric WC. I was concerned that it was not appropriate as pt states manual WC doesn't fit in home and I want her moving. I called [facility] and asked that they assess the pt and her home for electric WC due to my concerns. I never heard back from [place].   If she is able to use the manual WC and it fits in her home, then it doesn';t seem as if she needs electric WC.

## 2014-04-14 NOTE — Telephone Encounter (Signed)
Thanks!!! I will also stress at next appt that she needs to use the manual.

## 2014-04-14 NOTE — Telephone Encounter (Signed)
Talked with Elta Guadeloupe, PT and he states pt is able to get around in home with her manual wheel chair. He will see her next week and explain that Medicare will not pay for Electric  W/C. He will also bring her prices for a Water quality scientist that she can pay for.

## 2014-04-18 ENCOUNTER — Ambulatory Visit: Payer: Medicare Other | Admitting: Internal Medicine

## 2014-04-18 ENCOUNTER — Other Ambulatory Visit: Payer: Self-pay | Admitting: *Deleted

## 2014-04-18 ENCOUNTER — Other Ambulatory Visit: Payer: Self-pay | Admitting: Internal Medicine

## 2014-04-18 DIAGNOSIS — G894 Chronic pain syndrome: Secondary | ICD-10-CM

## 2014-04-18 NOTE — Telephone Encounter (Signed)
Appt has been changed 04/20/14 10:15AM with Dr Eyvonne Mechanic - pt request. Hilda Blades Yoshua Geisinger RN 04/18/14 2:50PM

## 2014-04-18 NOTE — Telephone Encounter (Signed)
Not due until the 30th and she had gotten the 3/30 Rx two days early. So, she can get Rx on the 1st at her appt or she can move appt to the 30th April. As I discussed at her appt, she needs to be seen for refill.

## 2014-04-20 ENCOUNTER — Ambulatory Visit (INDEPENDENT_AMBULATORY_CARE_PROVIDER_SITE_OTHER): Payer: Medicare Other | Admitting: Internal Medicine

## 2014-04-20 ENCOUNTER — Encounter: Payer: Self-pay | Admitting: Internal Medicine

## 2014-04-20 VITALS — BP 152/80 | HR 66 | Temp 97.4°F | Wt 278.0 lb

## 2014-04-20 DIAGNOSIS — G894 Chronic pain syndrome: Secondary | ICD-10-CM

## 2014-04-20 DIAGNOSIS — E1159 Type 2 diabetes mellitus with other circulatory complications: Secondary | ICD-10-CM

## 2014-04-20 LAB — POCT GLYCOSYLATED HEMOGLOBIN (HGB A1C): Hemoglobin A1C: 7.3

## 2014-04-20 LAB — GLUCOSE, CAPILLARY: Glucose-Capillary: 83 mg/dL (ref 70–99)

## 2014-04-20 MED ORDER — OXYCODONE-ACETAMINOPHEN 5-325 MG PO TABS: 2.0000 | ORAL_TABLET | ORAL | Status: DC | PRN

## 2014-04-20 NOTE — Assessment & Plan Note (Signed)
Oxycodone refilled.

## 2014-04-20 NOTE — Progress Notes (Signed)
Subjective:   [name] female   DOB: 1961-03-09 53 y.o.   MRN: [id number]  HPI: [name] is a 53 y.o. woman with PMH significant for HTN, DM, COPD, Chronic pain syndrome comes to the office for a refill of her Oxycodone.  Patient reports that she was asked by Dr. [name] to come to the clinic for the oxycodone refill. Patient denies any complaints during this visit.     Past Medical History  Diagnosis Date  . Depression   . GERD (gastroesophageal reflux disease)   . Hyperlipidemia   . Hypertension   . Glaucoma   . PVD (peripheral vascular disease) with claudication     Stents to bilateral common iliac arteries (left 2005, right 2008), on chronic plavix  . Hyperplastic colon polyp 12/2010    Per colonoscopy (12/2010) - Dr. Deatra Ina  . Asthma   . COPD 01/08/2007    PFT's 05/2007 : FEV1/FVC 82, FEV1 64% pred, FEF 25-75% 40% predicted, 16% improvement in FEV1 with bronchodilators.     . Tobacco abuse   . Chronic osteomyelitis of foot     chronic, right secondary to diabetic foot ulcers  . Polymicrobial bacterial infection 01/2013    GBS and S. aureus bacteremia // Source likely infected diabetic foot ulcer  . 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 Blood cultures 01/26/13 S. Aureus and GBS. Blood cultures Feb 6th, 8th, and 9th and March were negative.Repeat TEE 3/20 negative for vegitations  . 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 Chronic diastolic heart failure     grade 2 per 2D echocardiogram (01/2013)  . Ulcer of foot, chronic     Left. No OM per MRI (01/2013)  . DVT of upper extremity (deep vein thrombosis) 03/11/2013    Secondary to PICC line. Right brachial vein, diagnosed on 03/10/2013 Coumadin for 3 months. End date 06/10/2013   . Chronic pain syndrome 12/03/2011    Likely secondary to depression, "fibromyalgia", neuropathy, and obesity. Lumbar MRI 2014 no sig change from prior  (2008) : Stable hypertrophic facet disease most notable at L4-5. Stable shallow left foraminal/extraforaminal disc protrusion at L4-5. No direct neural compression.      . Environmental allergies     Hx: of  . Fatty liver 2003    observed on ultrasound abdomen  . CHF (congestive heart failure)   . Anginal pain     '3' of 10 ischemia ruled out 9/9   . Fibromyalgia   . Chronic bronchitis     "I get it alot" (09/28/2013)  . Exertional shortness of breath   . Rheumatoid arthritis   . Arthritis of lumbar spine   . Chronic lower back pain   . Diabetic peripheral neuropathy   . Type II diabetes mellitus with peripheral circulatory disorders, uncontrolled DX: 1993    Insulin dep. Poor control. Complicated by diabetic foot ulcer and diabetic eye disease.    . 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 2/2 tobacco use, DM, and cont weight bearing on surgical wound and developed gangrene    Current Outpatient Prescriptions  Medication Sig Dispense Refill  . ADVAIR DISKUS 250-50 MCG/DOSE AEPB TAKE 1 INHALATION BY MOUTH TWICE DAILY  180 each  3  . albuterol (PROVENTIL HFA;VENTOLIN HFA) 108 (90 BASE) MCG/ACT inhaler Inhale 2 puffs into the lungs every  6 (six) hours as needed for wheezing or shortness of breath.      Marland Kitchen albuterol (PROVENTIL) (5 MG/ML) 0.5% nebulizer solution Take 2.5 mg by nebulization every 6 (six) hours as needed for wheezing or shortness of breath.      Marland Kitchen atenolol (TENORMIN) 100 MG tablet Take 100 mg by mouth daily.      . baclofen (LIORESAL) 10 MG tablet TAKE 1 TABLET BY MOUTH 3 TIMES A DAY AS NEEDED FOR MUSCLE SPASMS  30 each  3  . benazepril (LOTENSIN) 40 MG tablet TAKE 1 TABLET BY MOUTH DAILY  90 tablet  3  . buPROPion (WELLBUTRIN SR) 150 MG 12 hr tablet Take 150 mg by mouth 2 (two) times daily.      . diphenhydrAMINE (BENADRYL) 25 MG tablet Take 25 mg by mouth every 6 (six) hours.       Marland Kitchen esomeprazole (NEXIUM)  20 MG capsule Take 40 mg by mouth every morning.       . furosemide (LASIX) 80 MG tablet TAKE 2 TABLETS BY MOUTH EVERY DAY  60 tablet  5  . HUMALOG MIX 75/25 KWIKPEN (75-25) 100 UNIT/ML Kwikpen INJECT 135 UNITS SUBCUTANEOUSLY IN THE MORNING BEFORE BREAKFAST & INJECT 70 UNITS SUBCUTANEOUSLY IN THE EVENING BEFORE EVENING MEAL  75 mL  11  . ipratropium (ATROVENT) 0.02 % nebulizer solution USE 1 VIAL VIA NEBULIZER EVERY 6 HOURS AS NEEDED FOR WHEEZING  75 mL  2  . LYRICA 200 MG capsule TAKE 1 CAPSULE BY MOUTH 3 TIMES A DAY  90 capsule  3  . metFORMIN (GLUCOPHAGE) 1000 MG tablet Take 1,000 mg by mouth 2 (two) times daily with a meal.      . mupirocin cream (BACTROBAN) 2 % Apply 1 application topically 2 (two) times daily.  15 g  0  . oxyCODONE-acetaminophen (ROXICET) 5-325 MG per tablet Take 2 tablets by mouth every 4 (four) hours as needed.  360 tablet  0  . potassium chloride SA (KLOR-CON M20) 20 MEQ tablet Take 2 tablets (40 mEq total) by mouth daily.  60 tablet  2  . pravastatin (PRAVACHOL) 40 MG tablet Take 40 mg by mouth daily.      Marland Kitchen PROAIR HFA 108 (90 BASE) MCG/ACT inhaler INHALE 2 PUFFS BY MOUTH EVERY 6 HOURS AS NEEDED FOR WHEEZING  25.5 g  3  . travoprost, benzalkonium, (TRAVATAN) 0.004 % ophthalmic solution 1 drop at bedtime.      . traZODone (DESYREL) 100 MG tablet Take 100 mg by mouth at bedtime.      . [DISCONTINUED] Potassium Chloride ER 20 MEQ TBCR Take 40 mEq by mouth daily.  30 tablet  0   No current facility-administered medications for this visit.   Family History  Problem Relation Age of Onset  . Diverticulosis Mother   . Diabetes Mother   . Hypertension Mother   . Congestive Heart Failure Mother   . Asthma Father   . CAD Sister 25    MI at age 66 per patient.  However, she has not had a stent or CABG.    History   Social History  . Marital Status: Single    Spouse Name: N/A    Number of Children: 2  . Years of Education: college   Occupational History  . Disability      previously worked as a IT sales professional History Main Topics  . Smoking status: Current Every Day Smoker -- 0.50 packs/day for 44 years  Types: Cigarettes    Last Attempt to Quit: 10/03/2013  . Smokeless tobacco: Never Used     Comment: Decreased.  . Alcohol Use: No  . Drug Use: No     Comment: 09/28/2013 "no marijuana since 2011, no crack/cocaine 1989"  . Sexual Activity: Not Currently   Other Topics Concern  . None   Social History Narrative   On disability. Lives with son in [place]. Formerly worked as Training and development officer.    Boyfriend passed away stage 4 cancer 2013/03/18.   S/p L BKA 2014. In wheelchair in paritially suitable apartment.    Review of Systems: Pertinent items are noted in HPI. Objective:  Physical Exam: Filed Vitals:   04/20/14 1104  BP: 152/80  Pulse: 66  Temp: 97.4 F (36.3 C)  TempSrc: Oral  Weight: 278 lb (126.1 kg)  SpO2: 95%   Constitutional: Vital signs reviewed.   Patient is a well-developed and well-nourished sitting in a wheel chair .She is not in acute distress and cooperative with exam. Alert and oriented x3.  Head: Normocephalic and atraumatic Cardiovascular: RRR, S1, S2 normal. no MRG. Pulmonary/Chest: normal respiratory effort, CTAB, no wheezes, rales, or rhonchi Musculoskeletal: Left BKA  Neurological: A&O x3 Psychiatric: Normal mood and affect.    Assessment & Plan:

## 2014-04-20 NOTE — Patient Instructions (Signed)
Take all the medications as advised. If your blood sugars are below 60, call the clinic to inform us. We might need to reduce your insulin.

## 2014-04-20 NOTE — Assessment & Plan Note (Signed)
HbA1C is 7.3 today. Patient denies any blood sugars <60. Her CBG is 83 in the clinic. Plans: Continue current regimen. Recommended to call the clinic if her CBG is less than 60 or if she feels light headed, diaphoretic.

## 2014-04-21 ENCOUNTER — Ambulatory Visit: Payer: Medicare Other | Admitting: Internal Medicine

## 2014-04-24 NOTE — Progress Notes (Signed)
Case discussed with Dr. Boggala at time of visit.  We reviewed the resident's history and exam and pertinent patient test results.  I agree with the assessment, diagnosis, and plan of care documented in the resident's note. 

## 2014-05-01 ENCOUNTER — Telehealth: Payer: Self-pay | Admitting: *Deleted

## 2014-05-01 NOTE — Telephone Encounter (Signed)
Morey Hummingbird with [place] [facility] care manager [phone] called for last office note, medication list and labs. Faxed to [phone] att - Morey Hummingbird. Hilda Blades Parks Czajkowski RN 05/01/14 3:45PM

## 2014-05-02 ENCOUNTER — Encounter (HOSPITAL_COMMUNITY): Payer: Self-pay | Admitting: Emergency Medicine

## 2014-05-02 ENCOUNTER — Inpatient Hospital Stay (HOSPITAL_COMMUNITY)
Admission: EM | Admit: 2014-05-02 | Discharge: 2014-05-08 | DRG: 190 | Disposition: A | Discharge status: Home Health | Payer: Medicare Other | Attending: Internal Medicine | Admitting: Internal Medicine

## 2014-05-02 ENCOUNTER — Emergency Department (HOSPITAL_COMMUNITY): Payer: Medicare Other

## 2014-05-02 DIAGNOSIS — R0602 Shortness of breath: Secondary | ICD-10-CM

## 2014-05-02 DIAGNOSIS — E118 Type 2 diabetes mellitus with unspecified complications: Secondary | ICD-10-CM | POA: Diagnosis present

## 2014-05-02 DIAGNOSIS — I5032 Chronic diastolic (congestive) heart failure: Secondary | ICD-10-CM | POA: Diagnosis present

## 2014-05-02 DIAGNOSIS — Z9119 Patient's noncompliance with other medical treatment and regimen: Secondary | ICD-10-CM

## 2014-05-02 DIAGNOSIS — G894 Chronic pain syndrome: Secondary | ICD-10-CM | POA: Diagnosis present

## 2014-05-02 DIAGNOSIS — Z885 Allergy status to narcotic agent status: Secondary | ICD-10-CM

## 2014-05-02 DIAGNOSIS — IMO0001 Reserved for inherently not codable concepts without codable children: Secondary | ICD-10-CM | POA: Diagnosis present

## 2014-05-02 DIAGNOSIS — I10 Essential (primary) hypertension: Secondary | ICD-10-CM | POA: Diagnosis present

## 2014-05-02 DIAGNOSIS — Z91199 Patient's noncompliance with other medical treatment and regimen due to unspecified reason: Secondary | ICD-10-CM

## 2014-05-02 DIAGNOSIS — D649 Anemia, unspecified: Secondary | ICD-10-CM | POA: Diagnosis present

## 2014-05-02 DIAGNOSIS — J45901 Unspecified asthma with (acute) exacerbation: Secondary | ICD-10-CM

## 2014-05-02 DIAGNOSIS — H409 Unspecified glaucoma: Secondary | ICD-10-CM | POA: Diagnosis present

## 2014-05-02 DIAGNOSIS — E11621 Type 2 diabetes mellitus with foot ulcer: Secondary | ICD-10-CM | POA: Diagnosis present

## 2014-05-02 DIAGNOSIS — F3289 Other specified depressive episodes: Secondary | ICD-10-CM | POA: Diagnosis present

## 2014-05-02 DIAGNOSIS — I5033 Acute on chronic diastolic (congestive) heart failure: Secondary | ICD-10-CM | POA: Diagnosis present

## 2014-05-02 DIAGNOSIS — Z8249 Family history of ischemic heart disease and other diseases of the circulatory system: Secondary | ICD-10-CM

## 2014-05-02 DIAGNOSIS — E1142 Type 2 diabetes mellitus with diabetic polyneuropathy: Secondary | ICD-10-CM | POA: Diagnosis present

## 2014-05-02 DIAGNOSIS — J189 Pneumonia, unspecified organism: Secondary | ICD-10-CM

## 2014-05-02 DIAGNOSIS — Z794 Long term (current) use of insulin: Secondary | ICD-10-CM

## 2014-05-02 DIAGNOSIS — I509 Heart failure, unspecified: Secondary | ICD-10-CM | POA: Diagnosis present

## 2014-05-02 DIAGNOSIS — M47817 Spondylosis without myelopathy or radiculopathy, lumbosacral region: Secondary | ICD-10-CM | POA: Diagnosis present

## 2014-05-02 DIAGNOSIS — F1120 Opioid dependence, uncomplicated: Secondary | ICD-10-CM | POA: Diagnosis present

## 2014-05-02 DIAGNOSIS — Z825 Family history of asthma and other chronic lower respiratory diseases: Secondary | ICD-10-CM

## 2014-05-02 DIAGNOSIS — F329 Major depressive disorder, single episode, unspecified: Secondary | ICD-10-CM | POA: Diagnosis present

## 2014-05-02 DIAGNOSIS — E785 Hyperlipidemia, unspecified: Secondary | ICD-10-CM | POA: Diagnosis present

## 2014-05-02 DIAGNOSIS — K219 Gastro-esophageal reflux disease without esophagitis: Secondary | ICD-10-CM | POA: Diagnosis present

## 2014-05-02 DIAGNOSIS — Z888 Allergy status to other drugs, medicaments and biological substances status: Secondary | ICD-10-CM

## 2014-05-02 DIAGNOSIS — J441 Chronic obstructive pulmonary disease with (acute) exacerbation: Principal | ICD-10-CM | POA: Diagnosis present

## 2014-05-02 DIAGNOSIS — G4700 Insomnia, unspecified: Secondary | ICD-10-CM | POA: Diagnosis present

## 2014-05-02 DIAGNOSIS — I798 Other disorders of arteries, arterioles and capillaries in diseases classified elsewhere: Secondary | ICD-10-CM | POA: Diagnosis present

## 2014-05-02 DIAGNOSIS — Z833 Family history of diabetes mellitus: Secondary | ICD-10-CM

## 2014-05-02 DIAGNOSIS — R1310 Dysphagia, unspecified: Secondary | ICD-10-CM | POA: Diagnosis not present

## 2014-05-02 DIAGNOSIS — R5381 Other malaise: Secondary | ICD-10-CM | POA: Diagnosis present

## 2014-05-02 DIAGNOSIS — Z720 Tobacco use: Secondary | ICD-10-CM | POA: Diagnosis present

## 2014-05-02 DIAGNOSIS — E119 Type 2 diabetes mellitus without complications: Secondary | ICD-10-CM | POA: Diagnosis present

## 2014-05-02 DIAGNOSIS — D696 Thrombocytopenia, unspecified: Secondary | ICD-10-CM | POA: Diagnosis present

## 2014-05-02 DIAGNOSIS — I152 Hypertension secondary to endocrine disorders: Secondary | ICD-10-CM | POA: Diagnosis present

## 2014-05-02 DIAGNOSIS — E1149 Type 2 diabetes mellitus with other diabetic neurological complication: Secondary | ICD-10-CM | POA: Diagnosis present

## 2014-05-02 DIAGNOSIS — E1159 Type 2 diabetes mellitus with other circulatory complications: Secondary | ICD-10-CM | POA: Diagnosis present

## 2014-05-02 DIAGNOSIS — J9600 Acute respiratory failure, unspecified whether with hypoxia or hypercapnia: Secondary | ICD-10-CM | POA: Diagnosis present

## 2014-05-02 DIAGNOSIS — J449 Chronic obstructive pulmonary disease, unspecified: Secondary | ICD-10-CM

## 2014-05-02 DIAGNOSIS — S88119A Complete traumatic amputation at level between knee and ankle, unspecified lower leg, initial encounter: Secondary | ICD-10-CM

## 2014-05-02 DIAGNOSIS — F17200 Nicotine dependence, unspecified, uncomplicated: Secondary | ICD-10-CM | POA: Diagnosis present

## 2014-05-02 DIAGNOSIS — J690 Pneumonitis due to inhalation of food and vomit: Secondary | ICD-10-CM

## 2014-05-02 DIAGNOSIS — E1165 Type 2 diabetes mellitus with hyperglycemia: Secondary | ICD-10-CM | POA: Diagnosis present

## 2014-05-02 DIAGNOSIS — E1151 Type 2 diabetes mellitus with diabetic peripheral angiopathy without gangrene: Secondary | ICD-10-CM | POA: Diagnosis present

## 2014-05-02 DIAGNOSIS — Z91041 Radiographic dye allergy status: Secondary | ICD-10-CM

## 2014-05-02 DIAGNOSIS — Z79891 Long term (current) use of opiate analgesic: Secondary | ICD-10-CM | POA: Diagnosis present

## 2014-05-02 DIAGNOSIS — M069 Rheumatoid arthritis, unspecified: Secondary | ICD-10-CM | POA: Diagnosis present

## 2014-05-02 LAB — BASIC METABOLIC PANEL
BUN: 9 mg/dL (ref 6–23)
CO2: 27 mEq/L (ref 19–32)
Calcium: 9 mg/dL (ref 8.4–10.5)
Chloride: 100 mEq/L (ref 96–112)
Creatinine, Ser: 0.63 mg/dL (ref 0.50–1.10)
GFR calc Af Amer: >90 mL/min (ref >90)
GFR calc non Af Amer: >90 mL/min (ref >90)
Glucose, Bld: 255 mg/dL — ABNORMAL HIGH (ref 70–99)
Potassium: 4.3 mEq/L (ref 3.7–5.3)
Sodium: 140 mEq/L (ref 137–147)

## 2014-05-02 LAB — PRO B NATRIURETIC PEPTIDE: Pro B Natriuretic peptide (BNP): 495.6 pg/mL — ABNORMAL HIGH (ref 0–125)

## 2014-05-02 LAB — GLUCOSE, CAPILLARY: Glucose-Capillary: 186 mg/dL — ABNORMAL HIGH (ref 70–99)

## 2014-05-02 LAB — CBC
HCT: 35.1 % — ABNORMAL LOW (ref 36.0–46.0)
Hemoglobin: 11.2 g/dL — ABNORMAL LOW (ref 12.0–15.0)
MCH: 29.1 pg (ref 26.0–34.0)
MCHC: 31.9 g/dL (ref 30.0–36.0)
MCV: 91.2 fL (ref 78.0–100.0)
Platelets: 140 10*3/uL — ABNORMAL LOW (ref 150–400)
RBC: 3.85 MIL/uL — ABNORMAL LOW (ref 3.87–5.11)
RDW: 16.7 % — ABNORMAL HIGH (ref 11.5–15.5)
WBC: 6.7 10*3/uL (ref 4.0–10.5)

## 2014-05-02 LAB — I-STAT TROPONIN, ED: Troponin i, poc: 0 ng/mL (ref 0.00–0.08)

## 2014-05-02 LAB — INFLUENZA PANEL BY PCR (TYPE A & B)
H1N1 flu by pcr: NOT DETECTED
Influenza A By PCR: NEGATIVE
Influenza B By PCR: NEGATIVE

## 2014-05-02 MED ORDER — INSULIN ASPART PROT & ASPART (70-30 MIX) 100 UNIT/ML ~~LOC~~ SUSP
135.0000 [IU] | Freq: Every day | SUBCUTANEOUS | Status: DC
  Administered 2014-05-03: 135 [IU] via SUBCUTANEOUS
  Filled 2014-05-02: qty 10

## 2014-05-02 MED ORDER — AZITHROMYCIN 250 MG PO TABS
250.0000 mg | ORAL_TABLET | Freq: Every day | ORAL | Status: AC
  Administered 2014-05-03 – 2014-05-06 (×4): 250 mg via ORAL
  Filled 2014-05-02 (×5): qty 1

## 2014-05-02 MED ORDER — ATENOLOL 100 MG PO TABS
100.0000 mg | ORAL_TABLET | Freq: Every day | ORAL | Status: DC
  Administered 2014-05-02 – 2014-05-08 (×7): 100 mg via ORAL
  Filled 2014-05-02 (×7): qty 1

## 2014-05-02 MED ORDER — ENOXAPARIN SODIUM 60 MG/0.6ML ~~LOC~~ SOLN
60.0000 mg | SUBCUTANEOUS | Status: DC
  Filled 2014-05-02 (×7): qty 0.6

## 2014-05-02 MED ORDER — PREGABALIN 100 MG PO CAPS
200.0000 mg | ORAL_CAPSULE | Freq: Three times a day (TID) | ORAL | Status: DC
  Administered 2014-05-02 – 2014-05-08 (×17): 200 mg via ORAL
  Filled 2014-05-02 (×17): qty 2

## 2014-05-02 MED ORDER — SODIUM CHLORIDE 0.9 % IJ SOLN
3.0000 mL | Freq: Two times a day (BID) | INTRAMUSCULAR | Status: DC
  Administered 2014-05-02 – 2014-05-08 (×11): 3 mL via INTRAVENOUS

## 2014-05-02 MED ORDER — BUPROPION HCL ER (SR) 150 MG PO TB12
150.0000 mg | ORAL_TABLET | Freq: Two times a day (BID) | ORAL | Status: DC
  Administered 2014-05-02 – 2014-05-08 (×12): 150 mg via ORAL
  Filled 2014-05-02 (×14): qty 1

## 2014-05-02 MED ORDER — ENOXAPARIN SODIUM 40 MG/0.4ML ~~LOC~~ SOLN: 40.0000 mg | SUBCUTANEOUS | Status: DC

## 2014-05-02 MED ORDER — PREDNISONE 20 MG PO TABS
40.0000 mg | ORAL_TABLET | Freq: Every day | ORAL | Status: DC
  Administered 2014-05-02: 40 mg via ORAL
  Filled 2014-05-02 (×2): qty 2

## 2014-05-02 MED ORDER — BACLOFEN 10 MG PO TABS
10.0000 mg | ORAL_TABLET | Freq: Three times a day (TID) | ORAL | Status: DC
  Administered 2014-05-02 – 2014-05-08 (×17): 10 mg via ORAL
  Filled 2014-05-02 (×21): qty 1

## 2014-05-02 MED ORDER — AZITHROMYCIN 500 MG PO TABS
500.0000 mg | ORAL_TABLET | Freq: Once | ORAL | Status: AC
  Administered 2014-05-02: 500 mg via ORAL
  Filled 2014-05-02: qty 1

## 2014-05-02 MED ORDER — FUROSEMIDE 80 MG PO TABS
160.0000 mg | ORAL_TABLET | Freq: Every day | ORAL | Status: DC
  Administered 2014-05-03 – 2014-05-08 (×6): 160 mg via ORAL
  Filled 2014-05-02 (×6): qty 2

## 2014-05-02 MED ORDER — INSULIN LISPRO PROT & LISPRO (75-25 MIX) 100 UNIT/ML KWIKPEN: 70.0000 [IU] | PEN_INJECTOR | Freq: Two times a day (BID) | SUBCUTANEOUS | Status: DC

## 2014-05-02 MED ORDER — ALBUTEROL SULFATE (2.5 MG/3ML) 0.083% IN NEBU
5.0000 mg | INHALATION_SOLUTION | Freq: Once | RESPIRATORY_TRACT | Status: AC
  Administered 2014-05-02: 5 mg via RESPIRATORY_TRACT
  Filled 2014-05-02: qty 6

## 2014-05-02 MED ORDER — SIMVASTATIN 20 MG PO TABS
20.0000 mg | ORAL_TABLET | Freq: Every day | ORAL | Status: DC
  Administered 2014-05-02 – 2014-05-07 (×6): 20 mg via ORAL
  Filled 2014-05-02 (×7): qty 1

## 2014-05-02 MED ORDER — INSULIN ASPART PROT & ASPART (70-30 MIX) 100 UNIT/ML ~~LOC~~ SUSP
70.0000 [IU] | Freq: Every day | SUBCUTANEOUS | Status: DC
  Administered 2014-05-02 – 2014-05-06 (×5): 70 [IU] via SUBCUTANEOUS
  Filled 2014-05-02 (×2): qty 10

## 2014-05-02 MED ORDER — IPRATROPIUM-ALBUTEROL 0.5-2.5 (3) MG/3ML IN SOLN
3.0000 mL | RESPIRATORY_TRACT | Status: DC
  Administered 2014-05-02: 3 mL via RESPIRATORY_TRACT
  Filled 2014-05-02: qty 3

## 2014-05-02 MED ORDER — OXYCODONE-ACETAMINOPHEN 5-325 MG PO TABS
2.0000 | ORAL_TABLET | ORAL | Status: DC | PRN
  Administered 2014-05-02 – 2014-05-08 (×16): 2 via ORAL
  Filled 2014-05-02 (×16): qty 2

## 2014-05-02 MED ORDER — IPRATROPIUM-ALBUTEROL 0.5-2.5 (3) MG/3ML IN SOLN
3.0000 mL | Freq: Three times a day (TID) | RESPIRATORY_TRACT | Status: DC
  Administered 2014-05-03 – 2014-05-08 (×17): 3 mL via RESPIRATORY_TRACT
  Filled 2014-05-02 (×17): qty 3

## 2014-05-02 MED ORDER — PANTOPRAZOLE SODIUM 40 MG PO TBEC
40.0000 mg | DELAYED_RELEASE_TABLET | Freq: Every day | ORAL | Status: DC
  Administered 2014-05-04 – 2014-05-08 (×5): 40 mg via ORAL
  Filled 2014-05-02 (×7): qty 1

## 2014-05-02 MED ORDER — ALBUTEROL SULFATE (2.5 MG/3ML) 0.083% IN NEBU: 3.0000 mL | INHALATION_SOLUTION | Freq: Four times a day (QID) | RESPIRATORY_TRACT | Status: DC | PRN

## 2014-05-02 MED ORDER — TRAZODONE HCL 100 MG PO TABS
100.0000 mg | ORAL_TABLET | Freq: Every evening | ORAL | Status: DC | PRN
  Filled 2014-05-02: qty 1

## 2014-05-02 MED ORDER — BENAZEPRIL HCL 40 MG PO TABS
40.0000 mg | ORAL_TABLET | Freq: Every day | ORAL | Status: DC
  Administered 2014-05-02 – 2014-05-08 (×7): 40 mg via ORAL
  Filled 2014-05-02 (×8): qty 1

## 2014-05-02 NOTE — ED Notes (Signed)
Admitting MD at bedside at this time.

## 2014-05-02 NOTE — Progress Notes (Signed)
The patient was explained the importance of calling for help when ambulating to the [facility] and about the use of the bed alarm.  She stated that she would call for help before she got out of bed, but she refused the bed alarm.

## 2014-05-02 NOTE — ED Notes (Signed)
Pt reports having a productive cough with clear sputum since Sunday. Having sob and "feels like she is drowning" no relief with neb tx at home. spo2 100% at triage. Reports increase in swelling to right foot and leg recently.

## 2014-05-02 NOTE — ED Notes (Signed)
Family at bedside. 

## 2014-05-02 NOTE — ED Notes (Signed)
Attempted report 

## 2014-05-02 NOTE — ED Provider Notes (Signed)
CSN: [id number]     Arrival date & time 05/02/14  1358 History   First MD Initiated Contact with Patient 05/02/14 1459     Chief Complaint  Patient presents with  . Cough  . Shortness of Breath     (Consider location/radiation/quality/duration/timing/severity/associated sxs/prior Treatment) Patient is a 53 y.o. female presenting with cough and shortness of breath. The history is provided by the patient.  Cough Associated symptoms: chest pain, chills and shortness of breath   Associated symptoms: no headaches and no rash   Shortness of Breath Associated symptoms: chest pain and cough   Associated symptoms: no abdominal pain, no headaches, no rash and no vomiting    patient presents with cough and shortness of breath the last few days. States she had a contact with similar symptoms. She has had a cough productive of clear sputum. She states she's had some chest pain. She states she feels miserable. She has had some swelling in her right lower leg, but states this is not unusual for her.  Past Medical History  Diagnosis Date  . Depression   . GERD (gastroesophageal reflux disease)   . Hyperlipidemia   . Hypertension   . Glaucoma   . PVD (peripheral vascular disease) with claudication     Stents to bilateral common iliac arteries (left 2005, right 2008), on chronic plavix  . Hyperplastic colon polyp 12/2010    Per colonoscopy (12/2010) - Dr. Deatra Ina  . Asthma   . COPD 01/08/2007    PFT's 05/2007 : FEV1/FVC 82, FEV1 64% pred, FEF 25-75% 40% predicted, 16% improvement in FEV1 with bronchodilators.     . Tobacco abuse   . Chronic osteomyelitis of foot     chronic, right secondary to diabetic foot ulcers  . Polymicrobial bacterial infection 01/2013    GBS and S. aureus bacteremia // Source likely infected diabetic foot ulcer  . 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 Blood cultures 01/26/13 S. Aureus and GBS. Blood cultures Feb 6th,  8th, and 9th and March were negative.Repeat TEE 3/20 negative for vegitations  . 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 Chronic diastolic heart failure     grade 2 per 2D echocardiogram (01/2013)  . Ulcer of foot, chronic     Left. No OM per MRI (01/2013)  . DVT of upper extremity (deep vein thrombosis) 03/11/2013    Secondary to PICC line. Right brachial vein, diagnosed on 03/10/2013 Coumadin for 3 months. End date 06/10/2013   . Chronic pain syndrome 12/03/2011    Likely secondary to depression, "fibromyalgia", neuropathy, and obesity. Lumbar MRI 2014 no sig change from prior (2008) : Stable hypertrophic facet disease most notable at L4-5. Stable shallow left foraminal/extraforaminal disc protrusion at L4-5. No direct neural compression.      . Environmental allergies     Hx: of  . Fatty liver 2003    observed on ultrasound abdomen  . CHF (congestive heart failure)   . Anginal pain     '3' of 10 ischemia ruled out 9/9   . Fibromyalgia   . Chronic bronchitis     "I get it alot" (09/28/2013)  . Exertional shortness of breath   . Rheumatoid arthritis   . Arthritis of lumbar spine   . Chronic lower back pain   . Diabetic peripheral neuropathy   . Type II diabetes mellitus with peripheral circulatory disorders, uncontrolled DX: 1993    Insulin  dep. Poor control. Complicated by diabetic foot ulcer and diabetic eye disease.    . 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 2/2 tobacco use, DM, and cont weight bearing on surgical wound and developed gangrene    Past Surgical History  Procedure Laterality Date  . Abdominal hysterectomy  1997    secondary to uterine fibroids  . Tubal ligation    . Shoulder arthroscopy w/ rotator cuff repair Bilateral   . Breast biopsy      multiple-benign per pt  . Wrist surgery Right     "for tumors" (09/28/2013)  . Ganglion cyst excision      multiple  . Other surgical  history      stents in lower ext  . Tee without cardioversion N/A 01/31/2013    Procedure: TRANSESOPHAGEAL ECHOCARDIOGRAM (TEE);  Surgeon: Fay Records, MD;  Location: [place];  Service: Cardiovascular;  Laterality: N/A;  Rm [phone]  . Bladder surgery      bladder reconstruction surgery  . Tee without cardioversion N/A 03/10/2013    Procedure: TRANSESOPHAGEAL ECHOCARDIOGRAM (TEE);  Surgeon: Larey Dresser, MD;  Location: [place];  Service: Cardiovascular;  Laterality: N/A;  Rm. [phone]  . Skin split graft Bilateral 05/13/2013    Procedure: Right and Left Foot Allograft Skin Graft;  Surgeon: Newt Minion, MD;  Location: [place];  Service: Orthopedics;  Laterality: Bilateral;  Right and Left Foot Allograft Skin Graft  . Toe amputation Left 08/31/2013    4TH & 5 TH TOE   . Amputation Left 08/31/2013    Procedure: AMPUTATION RAY;  Surgeon: Newt Minion, MD;  Location: [place];  Service: Orthopedics;  Laterality: Left;  Left Foot 5th Ray Amputation  . Esophagogastroduodenoscopy N/A 09/20/2013    Procedure: ESOPHAGOGASTRODUODENOSCOPY (EGD);  Surgeon: Jerene Bears, MD;  Location: [place];  Service: Gastroenterology;  Laterality: N/A;  . Foot amputation through metatarsal Left 09/28/2013  . Tonsillectomy    . Amputation Left 09/28/2013    Procedure: Left Midfoot amputation;  Surgeon: Newt Minion, MD;  Location: [place];  Service: Orthopedics;  Laterality: Left;  Left Midfoot amputation  . Amputation Left 10/14/2013    Procedure: AMPUTATION BELOW KNEE- left;  Surgeon: Newt Minion, MD;  Location: [place];  Service: Orthopedics;  Laterality: Left;  Left Below Knee Amputation    Family History  Problem Relation Age of Onset  . Diverticulosis Mother   . Diabetes Mother   . Hypertension Mother   . Congestive Heart Failure Mother   . Asthma Father   . CAD Sister 90    MI at age 30 per patient.  However, she has not had a stent or CABG.    History  Substance Use Topics  . Smoking status: Current  Every Day Smoker -- 0.50 packs/day for 44 years    Types: Cigarettes    Last Attempt to Quit: 10/03/2013  . Smokeless tobacco: Never Used     Comment: Decreased.  . Alcohol Use: No   OB History   Grav Para Term Preterm Abortions TAB SAB Ect Mult Living                 Review of Systems  Constitutional: Positive for chills, appetite change and fatigue. Negative for activity change.  Eyes: Negative for pain.  Respiratory: Positive for cough and shortness of breath. Negative for chest tightness.   Cardiovascular: Positive for chest  pain. Negative for leg swelling.  Gastrointestinal: Negative for nausea, vomiting, abdominal pain and diarrhea.  Genitourinary: Negative for flank pain.  Musculoskeletal: Negative for back pain and neck stiffness.  Skin: Negative for rash.  Neurological: Negative for weakness, numbness and headaches.  Psychiatric/Behavioral: Negative for behavioral problems.      Allergies  Iohexol; Ivp dye; and Morphine sulfate  Home Medications   Prior to Admission medications   Medication Sig Start Date End Date Taking? Authorizing Provider  albuterol (PROVENTIL HFA;VENTOLIN HFA) 108 (90 BASE) MCG/ACT inhaler Inhale 2 puffs into the lungs every 6 (six) hours as needed for wheezing or shortness of breath.   Yes Historical Provider, MD  albuterol (PROVENTIL) (5 MG/ML) 0.5% nebulizer solution Take 2.5 mg by nebulization every 6 (six) hours as needed for wheezing or shortness of breath.   Yes Historical Provider, MD  atenolol (TENORMIN) 100 MG tablet Take 100 mg by mouth daily.   Yes Historical Provider, MD  baclofen (LIORESAL) 10 MG tablet Take 10 mg by mouth 3 (three) times daily.   Yes Historical Provider, MD  benazepril (LOTENSIN) 40 MG tablet Take 40 mg by mouth daily.   Yes Historical Provider, MD  buPROPion (WELLBUTRIN SR) 150 MG 12 hr tablet Take 150 mg by mouth 2 (two) times daily.   Yes Historical Provider, MD  diphenhydrAMINE (BENADRYL) 25 MG tablet Take 25  mg by mouth every 6 (six) hours.  05/03/13  Yes Otho Bellows, MD  esomeprazole (NEXIUM) 20 MG capsule Take 40 mg by mouth daily.  11/15/13  Yes Bartholomew Crews, MD  Fluticasone-Salmeterol (ADVAIR) 250-50 MCG/DOSE AEPB Inhale 1 puff into the lungs 2 (two) times daily.   Yes Historical Provider, MD  furosemide (LASIX) 80 MG tablet Take 160 mg by mouth daily.   Yes Historical Provider, MD  Insulin Lispro Prot & Lispro (HUMALOG MIX 75/25 KWIKPEN) (75-25) 100 UNIT/ML Kwikpen Inject 70-135 Units into the skin 2 (two) times daily. Inject 135 units in the morning before breakfast.  Inject 70 units in the evening before evening meal.   Yes Historical Provider, MD  ipratropium (ATROVENT) 0.02 % nebulizer solution Take 0.5 mg by nebulization every 6 (six) hours as needed for wheezing or shortness of breath.   Yes Historical Provider, MD  metFORMIN (GLUCOPHAGE) 1000 MG tablet Take 1,000 mg by mouth 2 (two) times daily with a meal.   Yes Historical Provider, MD  mupirocin cream (BACTROBAN) 2 % Apply 1 application topically 2 (two) times daily. 11/19/13  Yes Hannah Muthersbaugh, PA-C  oxyCODONE-acetaminophen (PERCOCET/ROXICET) 5-325 MG per tablet Take 2 tablets by mouth every 4 (four) hours as needed for moderate pain or severe pain.   Yes Historical Provider, MD  potassium chloride SA (KLOR-CON M20) 20 MEQ tablet Take 2 tablets (40 mEq total) by mouth daily. 01/23/14  Yes Othella Boyer, MD  pravastatin (PRAVACHOL) 40 MG tablet Take 40 mg by mouth daily.   Yes Historical Provider, MD  pregabalin (LYRICA) 200 MG capsule Take 200 mg by mouth 3 (three) times daily.   Yes Historical Provider, MD  travoprost, benzalkonium, (TRAVATAN) 0.004 % ophthalmic solution Place 1 drop into both eyes at bedtime.    Yes Historical Provider, MD  traZODone (DESYREL) 100 MG tablet Take 100 mg by mouth at bedtime as needed for sleep.    Yes Historical Provider, MD   BP 155/75  Pulse 75  Temp(Src) 99 F (37.2 C) (Oral)  Resp 18   Ht 5\' 7"  (1.702 m)  Wt 265 lb (120.203 kg)  BMI 41.50 kg/m2  SpO2 94% Physical Exam  Constitutional: She is oriented to person, place, and time. She appears well-developed and well-nourished.  HENT:  Head: Normocephalic.  Eyes: Pupils are equal, round, and reactive to light.  Cardiovascular: Regular rhythm.   Pulmonary/Chest:  Diffuse harsh breath sounds with few scattered rales  Abdominal: Soft. There is no tenderness.  Musculoskeletal: She exhibits edema.  Bilateral lower extremity pitting edema. Left below the knee amputation.  Neurological: She is alert and oriented to person, place, and time.  Skin: Skin is warm.    ED Course  Procedures (including critical care time) Labs Review Labs Reviewed  CBC - Abnormal; Notable for the following:    RBC 3.85 (*)    Hemoglobin 11.2 (*)    HCT 35.1 (*)    RDW 16.7 (*)    Platelets 140 (*)    All other components within normal limits  BASIC METABOLIC PANEL - Abnormal; Notable for the following:    Glucose, Bld 255 (*)    All other components within normal limits  PRO B NATRIURETIC PEPTIDE - Abnormal; Notable for the following:    Pro B Natriuretic peptide (BNP) 495.6 (*)    All other components within normal limits  I-STAT TROPOININ, ED    Imaging Review Dg Chest 2 View  05/02/2014   CLINICAL DATA:  Cough and shortness of breath for 3 days with history of asthma hypertension diabetes COPD and CHF as well as smoking history  EXAM: CHEST  2 VIEW  COMPARISON:  NM MYOCAR MULTI w/SPECT w/WALL MOTION / EF dated 09/19/2013; DG CHEST 2 VIEW dated 09/16/2013  FINDINGS: There is mild cardiac enlargement. There is vascular congestion. The central pulmonary arteries are prominent. There is hazy perihilar opacity on the right. Left lung appears clear. No pleural effusions. There is bilateral perihilar peribronchial wall thickening.  IMPRESSION: 1. Cardiac enlargement with vascular congestion. Mild hazy right perihilar opacity noted, possibly  representing mild asymmetric pulmonary edema. Developing pneumonia not excluded. 2. Prominent central pulmonary arteries suggesting the possibility of pulmonary arterial hypertension.   Electronically Signed   By: Skipper Cliche M.D.   On: 05/02/2014 15:31     EKG Interpretation   Date/Time:  Tuesday May 02 2014 14:03:40 EDT Ventricular Rate:  79 PR Interval:  150 QRS Duration: 70 QT Interval:  396 QTC Calculation: 454 R Axis:   35 Text Interpretation:  Normal sinus rhythm Anterior infarct , age  undetermined Abnormal ECG Confirmed by Alvino Chapel  MD, Ovid Curd [phone]) on  05/02/2014 3:31:25 PM      MDM   Final diagnoses:  None    Patient with shortness of breath and cough. X-ray shows pneumonia versus asymmetric edema. She has some dyspnea. We'll treat as required pneumonia said she's not been in the hospital for the last 2 months. Will admit to internal medicine for    Jasper Riling. Alvino Chapel, MD 05/02/14 [phone]

## 2014-05-02 NOTE — H&P (Signed)
Date: 05/02/2014               Patient Name:  Jennifer [name] MRN: [id number]  DOB: 1961-08-27 Age / Sex: 53 y.o., female   PCP: Bartholomew Crews, MD         Medical Service: Internal Medicine Teaching Service         Attending Physician: Dr. Bartholomew Crews, MD    First Contact: Dr. Naaman Plummer Pager: [phone]  Second Contact: Dr. Aundra Dubin  Pager: [phone]       After Hours (After 5p/  First Contact Pager: [phone]  weekends / holidays): Second Contact Pager: [phone]   Chief Complaint: Shortness of breath  History of Present Illness:   Lashaun L. Minella is a 53 year old female with past medical history of insulin-dependent Type II DM, HTN, HL, asthma, grade 1 diastolic CHF (on 2D-echo 7/82/42), PVD s/p left BKA, depression, chronic pain, and tobacco abuse who presents with shortness of breath of two duration.   Pt reports that two days on Sunday she began having acute onset of shortness of breath with cough (clear sputum production), wheezing, fever, chills, nasal congestion, body aches, and decreased PO intake. She was having to use her albuterol inhaler every 4-6 hr and usually uses it only as needed. She has been compliant with advair daily. Her last asthma exacerbation was a few years ago (per records 06/2013) with no recent antibiotics/steroid use or history of intubations. She continues to smoke 0.5 pack days for past 45 years and does not wish to quit at this time.    Pt reports she has not taken her lasix 160 mg daily for the past 3 days since it makes her urinate too much. She has noticed increased right LE swelling. She reports no weight gain but does not check her weight at home. She has had no recent dietary indiscretion.     Meds: Current Facility-Administered Medications  Medication Dose Route Frequency Provider Last Rate Last Dose  . albuterol (PROVENTIL) (2.5 MG/3ML) 0.083% nebulizer solution 3 mL  3 mL Inhalation Q6H PRN Cresenciano Genre, MD      . atenolol (TENORMIN)  tablet 100 mg  100 mg Oral Daily Cresenciano Genre, MD      . baclofen (LIORESAL) tablet 10 mg  10 mg Oral TID Cresenciano Genre, MD      . benazepril (LOTENSIN) tablet 40 mg  40 mg Oral Daily Cresenciano Genre, MD      . buPROPion [facility] SR) 12 hr tablet 150 mg  150 mg Oral BID Cresenciano Genre, MD      . enoxaparin (LOVENOX) injection 60 mg  60 mg Subcutaneous Q24H Bartholomew Crews, MD      . Derrill Memo ON 05/03/2014] furosemide (LASIX) tablet 160 mg  160 mg Oral Daily Cresenciano Genre, MD      . Derrill Memo ON 05/03/2014] insulin aspart protamine- aspart (NOVOLOG MIX 70/30) injection 135 Units  135 Units Subcutaneous Q breakfast Bartholomew Crews, MD      . insulin aspart protamine- aspart (NOVOLOG MIX 70/30) injection 70 Units  70 Units Subcutaneous Q supper Bartholomew Crews, MD      . ipratropium-albuterol (DUONEB) 0.5-2.5 (3) MG/3ML nebulizer solution 3 mL  3 mL Nebulization Q4H Cresenciano Genre, MD      . oxyCODONE-acetaminophen (PERCOCET/ROXICET) 5-325 MG per tablet 2 tablet  2 tablet Oral Q4H PRN Cresenciano Genre, MD      . Derrill Memo ON 05/03/2014]  pantoprazole (PROTONIX) EC tablet 40 mg  40 mg Oral QAC breakfast Cresenciano Genre, MD      . predniSONE (DELTASONE) tablet 40 mg  40 mg Oral Q breakfast Cresenciano Genre, MD      . pregabalin (LYRICA) capsule 200 mg  200 mg Oral TID Cresenciano Genre, MD      . simvastatin (ZOCOR) tablet 20 mg  20 mg Oral q1800 Cresenciano Genre, MD      . sodium chloride 0.9 % injection 3 mL  3 mL Intravenous Q12H Cresenciano Genre, MD      . traZODone (DESYREL) tablet 100 mg  100 mg Oral QHS PRN Cresenciano Genre, MD        Allergies: Allergies as of 05/02/2014 - Review Complete 05/02/2014  Allergen Reaction Noted  . Iohexol  08/17/2007  . Ivp dye [iodinated diagnostic agents]  05/10/2013  . Morphine sulfate Itching and Rash    Past Medical History  Diagnosis Date  . Depression   . GERD (gastroesophageal reflux disease)   . Hyperlipidemia   . Hypertension   . Glaucoma   . PVD  (peripheral vascular disease) with claudication     Stents to bilateral common iliac arteries (left 2005, right 2008), on chronic plavix  . Hyperplastic colon polyp 12/2010    Per colonoscopy (12/2010) - Dr. Deatra Ina  . Asthma   . COPD 01/08/2007    PFT's 05/2007 : FEV1/FVC 82, FEV1 64% pred, FEF 25-75% 40% predicted, 16% improvement in FEV1 with bronchodilators.     . Tobacco abuse   . Chronic osteomyelitis of foot     chronic, right secondary to diabetic foot ulcers  . Polymicrobial bacterial infection 01/2013    GBS and S. aureus bacteremia // Source likely infected diabetic foot ulcer  . 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 Blood cultures 01/26/13 S. Aureus and GBS. Blood cultures Feb 6th, 8th, and 9th and March were negative.Repeat TEE 3/20 negative for vegitations  . 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 Chronic diastolic heart failure     grade 2 per 2D echocardiogram (01/2013)  . Ulcer of foot, chronic     Left. No OM per MRI (01/2013)  . DVT of upper extremity (deep vein thrombosis) 03/11/2013    Secondary to PICC line. Right brachial vein, diagnosed on 03/10/2013 Coumadin for 3 months. End date 06/10/2013   . Chronic pain syndrome 12/03/2011    Likely secondary to depression, "fibromyalgia", neuropathy, and obesity. Lumbar MRI 2014 no sig change from prior (2008) : Stable hypertrophic facet disease most notable at L4-5. Stable shallow left foraminal/extraforaminal disc protrusion at L4-5. No direct neural compression.      . Environmental allergies     Hx: of  . Fatty liver 2003    observed on ultrasound abdomen  . CHF (congestive heart failure)   . Anginal pain     '3' of 10 ischemia ruled out 9/9   . Fibromyalgia   . Chronic bronchitis     "I get it alot" (09/28/2013)  . Exertional shortness of breath   . Rheumatoid arthritis   . Arthritis of lumbar spine   . Chronic lower back pain   . Diabetic  peripheral neuropathy   . Type II diabetes mellitus with peripheral circulatory disorders, uncontrolled DX: 1993    Insulin dep. Poor control. Complicated by diabetic foot ulcer and diabetic eye disease.    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 2/2 tobacco use, DM, and cont weight bearing on surgical wound and developed gangrene    Past Surgical History  Procedure Laterality Date  . Abdominal hysterectomy  1997    secondary to uterine fibroids  . Tubal ligation    . Shoulder arthroscopy w/ rotator cuff repair Bilateral   . Breast biopsy      multiple-benign per pt  . Wrist surgery Right     "for tumors" (09/28/2013)  . Ganglion cyst excision      multiple  . Other surgical history      stents in lower ext  . Tee without cardioversion N/A 01/31/2013    Procedure: TRANSESOPHAGEAL ECHOCARDIOGRAM (TEE);  Surgeon: Fay Records, MD;  Location: [place];  Service: Cardiovascular;  Laterality: N/A;  Rm [phone]  . Bladder surgery      bladder reconstruction surgery  . Tee without cardioversion N/A 03/10/2013    Procedure: TRANSESOPHAGEAL ECHOCARDIOGRAM (TEE);  Surgeon: Larey Dresser, MD;  Location: [place];  Service: Cardiovascular;  Laterality: N/A;  Rm. [phone]  . Skin split graft Bilateral 05/13/2013    Procedure: Right and Left Foot Allograft Skin Graft;  Surgeon: Newt Minion, MD;  Location: [place];  Service: Orthopedics;  Laterality: Bilateral;  Right and Left Foot Allograft Skin Graft  . Toe amputation Left 08/31/2013    4TH & 5 TH TOE   . Amputation Left 08/31/2013    Procedure: AMPUTATION RAY;  Surgeon: Newt Minion, MD;  Location: [place];  Service: Orthopedics;  Laterality: Left;  Left Foot 5th Ray Amputation  . Esophagogastroduodenoscopy N/A 09/20/2013    Procedure: ESOPHAGOGASTRODUODENOSCOPY (EGD);  Surgeon: Jerene Bears, MD;  Location: [place];  Service: Gastroenterology;  Laterality: N/A;  . Foot  amputation through metatarsal Left 09/28/2013  . Tonsillectomy    . Amputation Left 09/28/2013    Procedure: Left Midfoot amputation;  Surgeon: Newt Minion, MD;  Location: [place];  Service: Orthopedics;  Laterality: Left;  Left Midfoot amputation  . Amputation Left 10/14/2013    Procedure: AMPUTATION BELOW KNEE- left;  Surgeon: Newt Minion, MD;  Location: [place];  Service: Orthopedics;  Laterality: Left;  Left Below Knee Amputation    Family History  Problem Relation Age of Onset  . Diverticulosis Mother   . Diabetes Mother   . Hypertension Mother   . Congestive Heart Failure Mother   . Asthma Father   . CAD Sister 63    MI at age 1 per patient.  However, she has not had a stent or CABG.    History   Social History  . Marital Status: Single    Spouse Name: N/A    Number of Children: 2  . Years of Education: college   Occupational History  . Disability     previously worked as a IT sales professional History Main Topics  . Smoking status: Current Every Day Smoker -- 0.50 packs/day for 44 years    Types: Cigarettes    Last Attempt to Quit: 10/03/2013  . Smokeless tobacco: Never Used     Comment: Decreased.  . Alcohol Use: No  . Drug Use: No     Comment: 09/28/2013 "no marijuana since 2011, no crack/cocaine 1989"  . Sexual Activity: Not Currently   Other Topics Concern  . Not on file   Social History Narrative   On disability.  Lives with son in [place]. Formerly worked as Training and development officer.    Boyfriend passed away stage 4 cancer 03/14/2013.   S/p L BKA 2014. In wheelchair in paritially suitable apartment.     Review of Systems: Review of Systems  Constitutional: Positive for fever, chills and malaise/fatigue. Negative for weight loss and diaphoresis.  HENT: Positive for congestion. Negative for sore throat.   Eyes: Negative for blurred vision.  Respiratory: Positive for cough, sputum production, shortness of breath and wheezing. Negative for hemoptysis.   Cardiovascular: Positive  for leg swelling (right LE). Negative for chest pain and palpitations.  Gastrointestinal: Positive for diarrhea (improved from 1 week ago). Negative for nausea, vomiting, abdominal pain, constipation and blood in stool.  Genitourinary: Negative for dysuria, urgency, frequency and hematuria.  Musculoskeletal: Positive for myalgias.  Skin: Negative for rash.  Neurological: Negative for dizziness, weakness and headaches.   Physical Exam: Blood pressure 185/62, pulse 75, temperature 99 F (37.2 C), temperature source Oral, resp. rate 18, height 5\' 7"  (1.702 m), weight 127.551 kg (281 lb 3.2 oz), SpO2 100.00%. Physical Exam  Constitutional: She is oriented to person, place, and time. She appears well-developed and well-nourished. No distress.  HENT:  Head: Normocephalic and atraumatic.  Right Ear: External ear normal.  Left Ear: External ear normal.  Nose: Nose normal.  Mouth/Throat: Oropharynx is clear and moist. No oropharyngeal exudate.  Eyes: Conjunctivae and EOM are normal. Pupils are equal, round, and reactive to light. Right eye exhibits no discharge. Left eye exhibits no discharge. No scleral icterus.  Neck: Normal range of motion. Neck supple.  Cardiovascular: Normal rate and normal heart sounds.   Pulmonary/Chest: No respiratory distress. She has wheezes. She has no rales. She exhibits no tenderness.  Poor airflow with decreased breath sounds  Abdominal: Soft. Bowel sounds are normal. She exhibits no distension. There is no tenderness. There is no rebound and no guarding.  Musculoskeletal: Normal range of motion. She exhibits edema (+2  pitting right LE ). She exhibits no tenderness.  Left BKA  Neurological: She is alert and oriented to person, place, and time.  Skin: Skin is warm and dry. No rash noted. She is not diaphoretic. No erythema. No pallor.  Psychiatric: She has a normal mood and affect. Her behavior is normal. Judgment and thought content normal.    Lab results: Basic  Metabolic Panel:  Recent Labs  05/02/14 1444  NA 140  K 4.3  CL 100  CO2 27  GLUCOSE 255*  BUN 9  CREATININE 0.63  CALCIUM 9.0    CBC:  Recent Labs  05/02/14 1444  WBC 6.7  HGB 11.2*  HCT 35.1*  MCV 91.2  PLT 140*    BNP:  Recent Labs  05/02/14 1444  PROBNP 495.6*   Urine Drug Screen: Drugs of Abuse     Component Value Date/Time   LABOPIA PPS 02/28/2014 1222   LABOPIA NONE DETECTED 09/17/2013 0521   COCAINSCRNUR NEG 02/28/2014 1222   COCAINSCRNUR NONE DETECTED 09/17/2013 0521   LABBENZ NEG 02/28/2014 1222   LABBENZ NONE DETECTED 09/17/2013 0521   LABBENZ NEG 06/28/2010 2130   AMPHETMU NONE DETECTED 09/17/2013 0521   AMPHETMU NEG 06/28/2010 2130   THCU NONE DETECTED 09/17/2013 0521   LABBARB NEG 02/28/2014 1222   LABBARB NONE DETECTED 09/17/2013 0521     Imaging results:  Dg Chest 2 View  05/02/2014   CLINICAL DATA:  Cough and shortness of breath for 3 days with history of asthma hypertension diabetes COPD  and CHF as well as smoking history  EXAM: CHEST  2 VIEW  COMPARISON:  NM MYOCAR MULTI w/SPECT w/WALL MOTION / EF dated 09/19/2013; DG CHEST 2 VIEW dated 09/16/2013  FINDINGS: There is mild cardiac enlargement. There is vascular congestion. The central pulmonary arteries are prominent. There is hazy perihilar opacity on the right. Left lung appears clear. No pleural effusions. There is bilateral perihilar peribronchial wall thickening.  IMPRESSION: 1. Cardiac enlargement with vascular congestion. Mild hazy right perihilar opacity noted, possibly representing mild asymmetric pulmonary edema. Developing pneumonia not excluded. 2. Prominent central pulmonary arteries suggesting the possibility of pulmonary arterial hypertension.   Electronically Signed   By: Skipper Cliche M.D.   On: 05/02/2014 15:31    Other results:  EKG:   Ventricular Rate: 79  PR Interval: 150  QRS Duration: 70  QT Interval: 396  QTC Calculation: 454  R Axis: 35  Text Interpretation: Normal sinus  rhythm Anterior infarct , age undetermined   Assessment & Plan by Problem:  Acute Exacerbation of Asthma in setting of possible viral URI/PNA and pulmonary edema - Pt with 2 day history of dyspnea, wheezing, cough with sputum production, fever, chills, and nasal congestion concerning for viral URI/PNA with no sepsis criteria on admission and CXR finding of possible developing PNA. Pt also with CXR findings of pulmonary edema in setting of diuretic noncompliance. Last PFT -Supplemental oxygen as needed to keep SpO2 >92% -Dunoeb Q4hr  -Albuterol nebulizer Q6hr  PRN  -Prednisone 40 mg x 5 days -Azithromycin 500 mg x1 then 250 mg x 4 day -Continue home Lasix 160 mg daily  -F/U viral respiratory & influenza panel  -Monitor CBC and fever curve -Hold home Advair 250-50 mcg -Consider repeat CXR  Mild Acute on Chronic Grade 1 Diastolic Chronic CHF - 2D-echo on 09/17/13 with grade 1 diastolic dysfunction and EF 65-70%. Pt with reported 3-day noncompliance with home lasix, pro-BNP mildly elevated at 495.6 with right LE and pulmonary edema on CXR.  -Monitor volume status  -Monitor daily weight & strict I & O's -Continue home Lasix 160 mg daily   Insulin-dependent Type II DM - Last A1c 8.6 on 10/14/13.  Pt on Humalog 75/25 of 135 U AM and 70 U PM (reported taking 140U & 80U)  -Obtain A1c -Novolog 70/30 of 135 U AM and 70 U PM (confirm dose with PCP) -Hold metformin 1000 mg BID  Hypertension - Currently hypertensive  -Continue home atenolol 100 mg daily  -Continue home benazepril 40 mg daily -Continue furosemide 160 mg daily   Chronic Normocytic Anemia -  Pt with Hg near baseline 10 with no active bleeding or hemodynamic instability. Etiology unknown. Last colonoscopy on 01/15/11 with mild diverticulitis and polyps. -Monitor CBC -Monitor for bleeding -Transfuse if Hg <7 -Consider anemia panel  Thrombocytopenia - Platelet count 140K below baseline of 200K with no active bleeding or hemodynamic  instability. -Monitor CBC -Monitor for bleeding  Hyperlipidemia - Last lipid panel on 06/28/13 with chol 142, TG 111, HDL 35, LDL 85. Pt at home on pravastatin 40 mg daily. -Simvastatin 20 mg daily   Chronic pain - Currently without pain  -Continue home percocet/roxicet 5-325 mg 2 tabs Q 4 hr PRN -Continue home lyrica 200 mg TID -Continue home baclofen 10 mg TID   Insomnia -Currently stable. -Continue home trazodone 100 mg PRN sleep  GERD - Currently without acid reflux symptoms.  -Protonix 40 mg daily   Tobacco Abuse - Reports does not want to quit -Wellbutrin  150 mg BID  Diet: Carb Modified  DVT Ppx: Lovenox  Code: Full   Dispo: Disposition is deferred at this time, awaiting improvement of current medical problems.   The patient does have a current PCP Bartholomew Crews, MD) and does need an [facility] hospital follow-up appointment after discharge.  The patient does have transportation limitations that hinder transportation to clinic appointments.  Signed: Juluis Mire, MD 05/02/2014, 7:20 PM

## 2014-05-02 NOTE — Progress Notes (Signed)
Pharmacy: Lovenox for VTE Prophylaxis  OBJECTIVE: Wt: 127.6 kg Height: 67 inches BMI~44.1   SCr 0.63 CrCl~100 ml/min  ASSESSMENT:  80 YOF who presented with SOB and cough being treated for PNA and to start lovenox for VTE px. Given the patient's BMI>30 and CrCl>30 ml/min, will adjust to 0.5 mg/kg dosing for VTE px  PLAN:  1. Adjust Lovenox to 60 mg SQ every 24 hours for VTE prophylaxis 2. Will monitor peripherally for any s/sx of bleeding and required dose adjustments  Alycia Rossetti, PharmD, BCPS Clinical Pharmacist Pager: [phone] 05/02/2014 7:00 PM

## 2014-05-03 DIAGNOSIS — K219 Gastro-esophageal reflux disease without esophagitis: Secondary | ICD-10-CM

## 2014-05-03 DIAGNOSIS — G8929 Other chronic pain: Secondary | ICD-10-CM

## 2014-05-03 DIAGNOSIS — I509 Heart failure, unspecified: Secondary | ICD-10-CM

## 2014-05-03 DIAGNOSIS — F17200 Nicotine dependence, unspecified, uncomplicated: Secondary | ICD-10-CM

## 2014-05-03 DIAGNOSIS — E785 Hyperlipidemia, unspecified: Secondary | ICD-10-CM

## 2014-05-03 DIAGNOSIS — J45901 Unspecified asthma with (acute) exacerbation: Secondary | ICD-10-CM

## 2014-05-03 DIAGNOSIS — I10 Essential (primary) hypertension: Secondary | ICD-10-CM

## 2014-05-03 DIAGNOSIS — G4700 Insomnia, unspecified: Secondary | ICD-10-CM

## 2014-05-03 DIAGNOSIS — D696 Thrombocytopenia, unspecified: Secondary | ICD-10-CM

## 2014-05-03 DIAGNOSIS — J189 Pneumonia, unspecified organism: Secondary | ICD-10-CM

## 2014-05-03 DIAGNOSIS — D649 Anemia, unspecified: Secondary | ICD-10-CM

## 2014-05-03 DIAGNOSIS — E119 Type 2 diabetes mellitus without complications: Secondary | ICD-10-CM

## 2014-05-03 DIAGNOSIS — I5033 Acute on chronic diastolic (congestive) heart failure: Secondary | ICD-10-CM

## 2014-05-03 LAB — GLUCOSE, CAPILLARY
Glucose-Capillary: 218 mg/dL — ABNORMAL HIGH (ref 70–99)
Glucose-Capillary: 190 mg/dL — ABNORMAL HIGH (ref 70–99)
Glucose-Capillary: 100 mg/dL — ABNORMAL HIGH (ref 70–99)
Glucose-Capillary: 98 mg/dL (ref 70–99)

## 2014-05-03 LAB — RESPIRATORY VIRUS PANEL
Adenovirus: NOT DETECTED
Influenza A H1: NOT DETECTED
Influenza A H3: NOT DETECTED
Influenza A: NOT DETECTED
Influenza B: NOT DETECTED
Metapneumovirus: NOT DETECTED
Parainfluenza 1: NOT DETECTED
Parainfluenza 2: NOT DETECTED
Parainfluenza 3: NOT DETECTED
Respiratory Syncytial Virus A: NOT DETECTED
Respiratory Syncytial Virus B: NOT DETECTED
Rhinovirus: NOT DETECTED

## 2014-05-03 LAB — HEMOGLOBIN A1C
Hgb A1c MFr Bld: 7.4 % — ABNORMAL HIGH (ref <5.7)
Mean Plasma Glucose: 166 mg/dL — ABNORMAL HIGH (ref <117)

## 2014-05-03 LAB — PROCALCITONIN: Procalcitonin: <0.1 ng/mL

## 2014-05-03 MED ORDER — GUAIFENESIN ER 600 MG PO TB12
600.0000 mg | ORAL_TABLET | Freq: Three times a day (TID) | ORAL | Status: DC
  Administered 2014-05-03 – 2014-05-08 (×14): 600 mg via ORAL
  Filled 2014-05-03 (×17): qty 1

## 2014-05-03 MED ORDER — GUAIFENESIN ER 600 MG PO TB12
600.0000 mg | ORAL_TABLET | Freq: Two times a day (BID) | ORAL | Status: DC
  Administered 2014-05-03: 600 mg via ORAL
  Filled 2014-05-03 (×3): qty 1

## 2014-05-03 MED ORDER — DIPHENHYDRAMINE HCL 25 MG PO CAPS
25.0000 mg | ORAL_CAPSULE | Freq: Four times a day (QID) | ORAL | Status: DC
  Administered 2014-05-03 – 2014-05-08 (×18): 25 mg via ORAL
  Filled 2014-05-03 (×24): qty 1

## 2014-05-03 MED ORDER — ALBUTEROL SULFATE (2.5 MG/3ML) 0.083% IN NEBU
2.5000 mg | INHALATION_SOLUTION | RESPIRATORY_TRACT | Status: DC | PRN
  Administered 2014-05-04 (×2): 2.5 mg via RESPIRATORY_TRACT
  Filled 2014-05-03 (×2): qty 3

## 2014-05-03 MED ORDER — BENZONATATE 100 MG PO CAPS
100.0000 mg | ORAL_CAPSULE | Freq: Three times a day (TID) | ORAL | Status: DC | PRN
  Administered 2014-05-03 – 2014-05-05 (×4): 100 mg via ORAL
  Filled 2014-05-03 (×4): qty 1

## 2014-05-03 MED ORDER — DIPHENHYDRAMINE HCL 25 MG PO TABS
25.0000 mg | ORAL_TABLET | Freq: Four times a day (QID) | ORAL | Status: DC
  Filled 2014-05-03 (×2): qty 1

## 2014-05-03 NOTE — Progress Notes (Signed)
Pt requesting to have bandage changed to Lt stump and went in to see pt to change site as she has been using bed side commode and had visitor earlier instructed to come back later.  Pt requesting for tx as was ordered by her surgeon which she stated is silverdene.   Instructed that it we don't have order and if would ask MD wd nurse to see her.  Pt stated that's fine.  Dr. Margart Sickles notified and ordered wound consult.  Pt made aware.  Karie Kirks, Therapist, sports.

## 2014-05-03 NOTE — Progress Notes (Signed)
Pt requesting to have dsg to left stump changed with dry dsg. And re-wrap with ace wrap as was.  Assisted pt with dsg changed.  Three small wounds noted, area cleaned with NS and dry dsg applied.  Pt appreciated and reminded her that MD has ordered wound nurse, that will see her tomorrow.  Karie Kirks, Therapist, sports.

## 2014-05-03 NOTE — Progress Notes (Signed)
UR completed Cristie Mckinney K. Ananya Mccleese, RN, BSN, [place], CCM  05/03/2014 3:09 PM

## 2014-05-03 NOTE — Progress Notes (Signed)
Patient has had no acute changes.  Still has a congested, hacking cough.  Flu panel came back negative, but currently waiting on respiratory panel results.  Gave PRN percocet for headache around 2000.

## 2014-05-03 NOTE — H&P (Signed)
  Date: 05/03/2014  Patient name: Jennifer [name]  Medical record number: [id number]  Date of birth: 03/01/1961   I have seen and evaluated Jennifer [name] and discussed their care with the Residency Team. Jennifer [name] was in her usual state of health until the 10th when she woke up with productive cough, dyspnea, RLE edema, & subjective fever. She denies anorexia and CP. Her son is not ill and she has had no sick contacts. She has not taken her Lasix in "couple of days" bc it makes her urinate too much.   Vitals : no fever, oxygenating well on [place]. Continuous coughing. HRRR. Lungs good air flow, few end exp wheezes, few crackles. + BS. + 2 LE edema with CVI changes. Alert and oriented.   Labs and imaging reviewed.  Assessment and Plan: I have seen and evaluated the patient as outlined above. I agree with the formulated Assessment and Plan as detailed in the residents' admission note, with the following changes:   1. Acute respiratory failure - likely 2/2 pul edema and asthma exac - The cause of the pul edema is non compliance with home lasix. Will resume and lasix 160 PO QD and follow weights and I/O. May need more aggressive diuresis. Tx asthma exac with PO steroids (OK with 40 for 4 days), nebs (scheduled TID), O2 prn. Add Mucinex. On opioid for cough suppressant. Repeat CXR tomorrow to see if the RLL abnl has improved with diuresis or whether it represents an infiltrate and if so, would need to consider aspiration due to h/o oversedation 2/2 meds. Currently on azithro for this ? Infiltrate.  2. Chronic pain - cont home dose percocet. PT / OT consult tomorrow.   Bartholomew Crews, MD 5/13/201510:51 AM

## 2014-05-03 NOTE — Progress Notes (Signed)
Subjective: Patient was in acute distress due to excessive coughing with yellowish sputum. Has subjective feeling of fever. PMH of asthma,DM with left BKA, diastolic heart failure, dyslipidemia, and peripheral arterial disease. Denies any chest pain, palpitation, abdominal pain, dysuria . She said she started these symptoms all of a sudden  on Sunday which are progressing since. Objective: Vital signs in last 24 hours: Filed Vitals:   05/03/14 0149 05/03/14 0528 05/03/14 0636 05/03/14 0857  BP: 132/40 173/46 130/88   Pulse: 59 59    Temp: 98.1 F (36.7 C) 98 F (36.7 C)    TempSrc: Oral Oral    Resp: 20 20    Height:      Weight:  126.735 kg (279 lb 6.4 oz)    SpO2: 95% 100%  98%   Weight change:   Intake/Output Summary (Last 24 hours) at 05/03/14 1428 Last data filed at 05/03/14 0900  Gross per 24 hour  Intake    440 ml  Output    650 ml  Net   -210 ml   BP 130/88  Pulse 59  Temp(Src) 98 F (36.7 C) (Oral)  Resp 20  Ht 5\' 7"  (1.702 m)  Wt 126.735 kg (279 lb 6.4 oz)  BMI 43.75 kg/m2  SpO2 98%                                          Alert, and cooperative but in distress due to excessive coughing. Head:    Normocephalic, without obvious abnormality, atraumatic  Eyes:    PERRL, conjunctiva/corneas clear, EOM's intact,       Nose:   Nares normal, septum midline, mucosa normal, no drainage    or sinus tenderness  Throat:   Lips, mucosa, and tongue normal; teeth and gums normal  Neck:   Supple, symmetrical, trachea midline, no adenopathy;    thyroid:  no enlargement/tenderness/nodules;        Lungs:     Scattered wheezing with some basal crackles.   Chest Wall:    No tenderness or deformity   Heart:    Regular rate and rhythm, S1 and S2 normal, no murmur, rub   or gallop  ABDOMEN   + :Soft non tender, no organomegaly, BS +  Extremities:   Left BKA. RLE oedema 2+     Skin:   Skin color, texture, turgor normal, no rashes or lesions     Neurologic:   CNII-XII intact,  normal strength, sensation and reflexes    throughout   Lab Results: @LABTEST2 @ Micro Results: No results found for this or any previous visit (from the past 240 hour(s)). Studies/Results: Dg Chest 2 View  05/02/2014   CLINICAL DATA:  Cough and shortness of breath for 3 days with history of asthma hypertension diabetes COPD and CHF as well as smoking history  EXAM: CHEST  2 VIEW  COMPARISON:  NM MYOCAR MULTI w/SPECT w/WALL MOTION / EF dated 09/19/2013; DG CHEST 2 VIEW dated 09/16/2013  FINDINGS: There is mild cardiac enlargement. There is vascular congestion. The central pulmonary arteries are prominent. There is hazy perihilar opacity on the right. Left lung appears clear. No pleural effusions. There is bilateral perihilar peribronchial wall thickening.  IMPRESSION: 1. Cardiac enlargement with vascular congestion. Mild hazy right perihilar opacity noted, possibly representing mild asymmetric pulmonary edema. Developing pneumonia not excluded. 2. Prominent central pulmonary arteries suggesting the possibility of  pulmonary arterial hypertension.   Electronically Signed   By: Skipper Cliche M.D.   On: 05/02/2014 15:31   Medications: medication reviewed. Scheduled Meds: . atenolol  100 mg Oral Daily  . azithromycin  250 mg Oral Daily  . baclofen  10 mg Oral TID  . benazepril  40 mg Oral Daily  . buPROPion  150 mg Oral BID  . enoxaparin (LOVENOX) injection  60 mg Subcutaneous Q24H  . furosemide  160 mg Oral Daily  . guaiFENesin  600 mg Oral BID  . insulin aspart protamine- aspart  135 Units Subcutaneous Q breakfast  . insulin aspart protamine- aspart  70 Units Subcutaneous Q supper  . ipratropium-albuterol  3 mL Nebulization TID  . pantoprazole  40 mg Oral QAC breakfast  . predniSONE  40 mg Oral Q breakfast  . pregabalin  200 mg Oral TID  . simvastatin  20 mg Oral q1800  . sodium chloride  3 mL Intravenous Q12H   Continuous Infusions:  PRN Meds:.albuterol, oxyCODONE-acetaminophen,  traZODone Assessment/Plan: 1:  Acute exacerbation of asthma: May be due to viral URI or aspiration pneumonia as her CXR has a questionable RLL infiltrate. Continue with her current meds. Of azithromycin, O2 prn, nebs.and prednisone po. Her sat. Is 98% at room air. Keep monitoring that.  Repeat CXR tomorrow to see any changes  . If more susceptible for pneumonia change her antibiotics. Add some mucinex and cough suppressant to make her comfortable. 2:  Diastolic CHF: Restart her lasix 160 QD as she was not taking for last three days due to excessive urination.Might need some increase, re access tomorrow.Keep record of I/O. 3:  DM: Continue with novolog. 4: HTN: Continue with her home meds. Of atenolol 100mg  daily,benazepril 40mg  daily and furosemide 160mg  daily. 5: Thrombocytopenia: Platelet count is little lower then her baseline, no active bleeding. Repeat CBC tomorrow to check platelet and WBC . 6: DYSLIPIDEMIA: Continue with her home meds. Of pravastatin40mg  QD and simvastatin 20mg  QD. 8: Deconditioning: PT/OT from tomorrow.  This is a Careers information officer Note.  The care of the patient was discussed with Dr. [name] and the assessment and plan formulated with their assistance.  Please see their attached note for official documentation of the daily encounter.   LOS: 1 day   Lorella Nimrod, Med Student 05/03/2014, 2:28 PM

## 2014-05-03 NOTE — Progress Notes (Signed)
Inpatient Diabetes Program Recommendations  AACE/ADA: New Consensus Statement on Inpatient Glycemic Control (2013)  Target Ranges:  Prepandial:   less than 140 mg/dL      Peak postprandial:   less than 180 mg/dL (1-2 hours)      Critically ill patients:  140 - 180 mg/dL  Results for Jennifer [name], Jennifer [name] (MRN [id number]) as of 05/03/2014 11:50  Ref. Range 05/02/2014 21:37 05/03/2014 05:24 05/03/2014 10:36  Glucose-Capillary Latest Range: 70-99 mg/dL 186 (H) 218 (H) 190 (H)   Inpatient Diabetes Program Recommendations Correction (SSI): add Novolog moderate correction scale TID per Glycemic Control order-set Thank you  Raoul Pitch BSN, RN,CDE Inpatient Diabetes Coordinator [phone] (team pager)

## 2014-05-03 NOTE — Progress Notes (Signed)
Patient is currently active with [facility] for chronic disease management services.  Patient has been engaged by a [employer] and LCSW.  Our community based plan of care has focused on disease management, medication procurement/adherence, and community resource support.  Patient will receive a post discharge transition of care call and will be evaluated for monthly home visits for assessments and disease process education.  Made Inpatient Case Manager aware that [facility] following. Of note, [facility] Care Management services does not replace or interfere with any services that are arranged by inpatient case management or social work.  For additional questions or referrals please contact Corliss Blacker BSN RN [facility] Liaison at [phone].

## 2014-05-03 NOTE — Progress Notes (Signed)
Please see my note from 05/03/14 

## 2014-05-03 NOTE — Care Management Note (Addendum)
  Page 2 of 2   05/05/2014     3:27:14 PM CARE MANAGEMENT NOTE 05/05/2014  Patient:  Jennifer [name], Jennifer [name]   Account Number:  [id number]  Date Initiated:  05/03/2014  Documentation initiated by:  Montoya Watkin  Subjective/Objective Assessment:   admitted with fever, SOB, r/o flu, Asthma, CHF     Action/Plan:   CM to follow for dispostion needs.   Anticipated DC Date:  05/06/2014   Anticipated DC Plan:  [place]  CM consult      [facility] Choice  HOME HEALTH   Choice offered to / List presented to:  C-1 Patient        [place] arranged  HH-1 RN  [facility] OT      [place]   Status of service:  Completed, signed off Medicare Important Message given?  YES (If response is "NO", the following Medicare IM given date fields will be blank) Date Medicare IM given:  05/02/2014 Date Additional Medicare IM given:    Discharge Disposition:    Per UR Regulation:  Reviewed for med. necessity/level of care/duration of stay  If discussed at [place] of Stay Meetings, dates discussed:    Comments:  Donel Osowski RN, BSN, MSHL, CCM  Nurse - Case Manager, (Unit [place])  [phone]  05/05/2014 hx/o nonadherence with home Lasix regime. PT/OT RECS: HH PT and OT THN:  yes Dispositon Plan: HHS:  RN, PT, OT -  Active with [place] and CM notified by [facility] awareness of IP admission Disposition:  Home with HHS:  resume RN/PT services with Arville Go at discharge.     Teagyn Fishel RN, BSN, MSHL, CCM  Nurse - Case Manager, ([phone]  05/03/2014 hx/o nonadherence with home Lasix regime. PT/OT RECS: [place] THN:  yes HHS:  Active with [place] and CM notified by [facility] awareness of IP admission Disposition:  Home with HHS:  resume RN/PT services with Arville Go at discharge.

## 2014-05-04 ENCOUNTER — Inpatient Hospital Stay (HOSPITAL_COMMUNITY): Payer: Medicare Other

## 2014-05-04 DIAGNOSIS — M7989 Other specified soft tissue disorders: Secondary | ICD-10-CM

## 2014-05-04 DIAGNOSIS — M79609 Pain in unspecified limb: Secondary | ICD-10-CM

## 2014-05-04 DIAGNOSIS — I5032 Chronic diastolic (congestive) heart failure: Secondary | ICD-10-CM

## 2014-05-04 DIAGNOSIS — R0602 Shortness of breath: Secondary | ICD-10-CM

## 2014-05-04 DIAGNOSIS — E1159 Type 2 diabetes mellitus with other circulatory complications: Secondary | ICD-10-CM

## 2014-05-04 DIAGNOSIS — G894 Chronic pain syndrome: Secondary | ICD-10-CM

## 2014-05-04 LAB — GLUCOSE, CAPILLARY
Glucose-Capillary: 108 mg/dL — ABNORMAL HIGH (ref 70–99)
Glucose-Capillary: 158 mg/dL — ABNORMAL HIGH (ref 70–99)
Glucose-Capillary: 223 mg/dL — ABNORMAL HIGH (ref 70–99)
Glucose-Capillary: 359 mg/dL — ABNORMAL HIGH (ref 70–99)
Glucose-Capillary: 447 mg/dL — ABNORMAL HIGH (ref 70–99)
Glucose-Capillary: 80 mg/dL (ref 70–99)

## 2014-05-04 LAB — BLOOD GAS, ARTERIAL
Acid-Base Excess: 2.5 mmol/L — ABNORMAL HIGH (ref 0.0–2.0)
Bicarbonate: 26.6 mEq/L — ABNORMAL HIGH (ref 20.0–24.0)
Drawn by: 24485
O2 Content: 3 L/min
O2 Saturation: 94.8 %
Patient temperature: 98.6
TCO2: 27.9 mmol/L (ref 0–100)
pCO2 arterial: 41.9 mmHg (ref 35.0–45.0)
pH, Arterial: 7.419 (ref 7.350–7.450)
pO2, Arterial: 73.7 mmHg — ABNORMAL LOW (ref 80.0–100.0)

## 2014-05-04 MED ORDER — METHYLPREDNISOLONE SODIUM SUCC 125 MG IJ SOLR
60.0000 mg | Freq: Three times a day (TID) | INTRAMUSCULAR | Status: AC
  Administered 2014-05-04 – 2014-05-05 (×3): 60 mg via INTRAVENOUS
  Filled 2014-05-04 (×3): qty 0.96

## 2014-05-04 MED ORDER — INSULIN ASPART PROT & ASPART (70-30 MIX) 100 UNIT/ML ~~LOC~~ SUSP
135.0000 [IU] | Freq: Every day | SUBCUTANEOUS | Status: DC
  Administered 2014-05-04 – 2014-05-07 (×4): 135 [IU] via SUBCUTANEOUS

## 2014-05-04 MED ORDER — PREDNISONE 20 MG PO TABS
40.0000 mg | ORAL_TABLET | Freq: Every day | ORAL | Status: DC
  Administered 2014-05-04: 40 mg via ORAL
  Filled 2014-05-04 (×2): qty 2

## 2014-05-04 MED ORDER — TRAZODONE HCL 100 MG PO TABS
200.0000 mg | ORAL_TABLET | Freq: Every evening | ORAL | Status: DC | PRN
  Administered 2014-05-05: 200 mg via ORAL
  Filled 2014-05-04: qty 2

## 2014-05-04 MED ORDER — CLINDAMYCIN HCL 300 MG PO CAPS
600.0000 mg | ORAL_CAPSULE | Freq: Three times a day (TID) | ORAL | Status: AC
  Administered 2014-05-04 – 2014-05-05 (×3): 600 mg via ORAL
  Filled 2014-05-04 (×3): qty 2

## 2014-05-04 MED ORDER — INSULIN ASPART 100 UNIT/ML ~~LOC~~ SOLN
18.0000 [IU] | Freq: Once | SUBCUTANEOUS | Status: AC
  Administered 2014-05-04: 18 [IU] via SUBCUTANEOUS

## 2014-05-04 MED ORDER — SILVER SULFADIAZINE 1 % EX CREA
TOPICAL_CREAM | Freq: Every day | CUTANEOUS | Status: DC
  Administered 2014-05-04 – 2014-05-08 (×5): via TOPICAL
  Filled 2014-05-04: qty 85

## 2014-05-04 NOTE — Progress Notes (Signed)
CRITICAL VALUE ALERT  Critical value received:  CBG = 447  Date of notification:  05/04/2014  Time of notification:  2152  Critical value read back:yes  Nurse who received alert:  Shellee Milo, RN  MD notified (1st page):  Heber Bystrom, MD on call IMTS  Time of first page:  2152  Awaiting new orders. Shellee Milo, RN

## 2014-05-04 NOTE — Evaluation (Addendum)
Physical Therapy Evaluation Patient Details Name: Jennifer [name] MRN: [id number] DOB: February 16, 1961 Today's Date: 05/04/2014   History of Present Illness  53yo female adm 05/02/14 with SOB due to pulmondary edema and asthma exacerbation; PMHx: DM, arthritis, HTN and PVD, L BKA; dopper neg for DVT  Clinical Impression  Pt will benefit from PT to address deficits below; will need to continue HHPT, pt states Arville Go was following her prior to adm    Follow Up Recommendations Home health PT    Equipment Recommendations  None recommended by PT    Recommendations for Other Services       Precautions / Restrictions Precautions Precautions: Fall      Mobility  Bed Mobility Overal bed mobility: Modified Independent                Transfers Overall transfer level: Needs assistance Equipment used: None (bedrail) Transfers: Sit to/from [facility] Sit to Stand: Min guard;Supervision Stand pivot transfers: Supervision       General transfer comment: for safety; pt has her own way of doing things; she does a 180*turn with BSC directly in front of her--this is what she does at home per her report; seh did have LOB x 1 with min/guard recovery and sat back onto bed; she performs a 90*turn and WBs on her residual limb (on the bed) to perform  hygiene after toileting  Ambulation/Gait                Stairs            Wheelchair Mobility    Modified Rankin (Stroke Patients Only)       Balance Overall balance assessment: Needs assistance Sitting-balance support: No upper extremity supported;Feet unsupported Sitting balance-Leahy Scale: Good     Standing balance support: During functional activity;Single extremity supported Standing balance-Leahy Scale: Fair                               Pertinent Vitals/Pain C/o pain RLE,edematous  sats 91% on RA    Home Living Family/patient expects to be discharged to:: Private  residence Living Arrangements: Children (son) Available Help at Discharge: Family;Available PRN/intermittently Type of Home: Apartment Home Access:  (back entry level but there are tree roots)     Home Layout: One level Home Equipment: Wheelchair - [employer] - 2 wheels;Bedside commode;Walker - 4 wheels Additional Comments: pt reports she can go around to backdoor and does not have any steps  but has multiple tree roots/stumps    Prior Function Level of Independence: Independent with assistive device(s)         Comments: w/c is 1* mode of locomotion, she does amb into bathroom with RW at times     Hand Dominance        Extremity/Trunk Assessment   Upper Extremity Assessment: Overall WFL for tasks assessed           Lower Extremity Assessment: LLE deficits/detail;RLE deficits/detail RLE Deficits / Details: AROM grossly WFL, able to stand/WB with UE support, edematous LLE Deficits / Details: LBKA with wounds     Communication   Communication: No difficulties  Cognition Arousal/Alertness: Awake/alert Behavior During Therapy: WFL for tasks assessed/performed Overall Cognitive Status: Within Functional Limits for tasks assessed                      General Comments      Exercises  Assessment/Plan    PT Assessment Patient needs continued PT services  PT Diagnosis Generalized weakness   PT Problem List Decreased activity tolerance;Decreased balance;Decreased mobility;Decreased knowledge of use of DME  PT Treatment Interventions DME instruction;Functional mobility training;Gait training;Therapeutic activities;Therapeutic exercise;Patient/family education;Wheelchair mobility training   PT Goals (Current goals can be found in the Care Plan section) Acute Rehab PT Goals Patient Stated Goal: get home when feeling better PT Goal Formulation: With patient Time For Goal Achievement: 05/18/14 Potential to Achieve Goals: Good    Frequency Min  3X/week   Barriers to discharge        Co-evaluation               End of Session   Activity Tolerance: Patient tolerated treatment well Patient left: Other (comment);with call bell/phone within reach           Time: 1432-1451 PT Time Calculation (min): 19 min   Charges:   PT Evaluation $Initial PT Evaluation Tier I: 1 Procedure PT Treatments $Therapeutic Activity: 8-22 mins   PT G Codes:          Neil Crouch 05/04/2014, 3:05 PM

## 2014-05-04 NOTE — Progress Notes (Addendum)
Subjective: Pt still sob on 3 L [place] Duonebs not helping, She c/p right pain and right legs feels heavy which started today. She has post right thigh pain which radiates to lower ab 10/10. She has left leg wound being followed by Dr. Sharol Given   Objective: Vital signs in last 24 hours: Filed Vitals:   05/04/14 0644 05/04/14 0755 05/04/14 0905 05/04/14 1041  BP: 158/72   186/70  Pulse: 75 72  72  Temp: 98.4 F (36.9 C)  99.4 F (37.4 C)   TempSrc: Oral  Oral   Resp: 22 20    Height:      Weight: 274 lb (124.286 kg)     SpO2: 92% 92%     Weight change: 9 lb (4.082 kg)  Intake/Output Summary (Last 24 hours) at 05/04/14 1051 Last data filed at 05/04/14 1041  Gross per 24 hour  Intake    723 ml  Output    801 ml  Net    -78 ml   Vitals reviewed. General: resting in bed, NAD HEENT: [place]/at, no scleral icterus Cardiac: RRR, no rubs, murmurs or gallops Pulm: wheezing b/l on [place] 3 L Abd: soft, nontender, nondistended, BS present Ext: warm and well perfused, 1+ pedal edema, left BKA with 2 x 0.5 cm and 0.5 x 0.2 x0.2 cm wound left anterior BKA Neuro: alert and oriented X3, moving extremities   Lab Results: Basic Metabolic Panel:  Recent Labs Lab 05/02/14 1444  NA 140  K 4.3  CL 100  CO2 27  GLUCOSE 255*  BUN 9  CREATININE 0.63  CALCIUM 9.0   CBC:  Recent Labs Lab 05/02/14 1444  WBC 6.7  HGB 11.2*  HCT 35.1*  MCV 91.2  PLT 140*   BNP:  Recent Labs Lab 05/02/14 1444  PROBNP 495.6*   CBG:  Recent Labs Lab 05/03/14 0524 05/03/14 1036 05/03/14 1310 05/03/14 1738 05/03/14 2050 05/04/14 0544  GLUCAP 218* 190* 100* 98 158* 80   Hemoglobin A1C:  Recent Labs Lab 05/03/14 0820  HGBA1C 7.4*   Misc. Labs: none  Micro Results: Recent Results (from the past 240 hour(s))  RESPIRATORY VIRUS PANEL     Status: None   Collection Time    05/02/14  9:06 PM      Result Value Ref Range Status   Source - RVPAN NASAL SWAB   Corrected   Comment: CORRECTED ON  05/13 AT 1715: PREVIOUSLY REPORTED AS NASAL SWAB   Respiratory Syncytial Virus A NOT DETECTED   Final   Respiratory Syncytial Virus B NOT DETECTED   Final   Influenza A NOT DETECTED   Final   Influenza B NOT DETECTED   Final   Parainfluenza 1 NOT DETECTED   Final   Parainfluenza 2 NOT DETECTED   Final   Parainfluenza 3 NOT DETECTED   Final   Metapneumovirus NOT DETECTED   Final   Rhinovirus NOT DETECTED   Final   Adenovirus NOT DETECTED   Final   Influenza A H1 NOT DETECTED   Final   Influenza A H3 NOT DETECTED   Final   Comment: (NOTE)           Normal Reference Range for each Analyte: NOT DETECTED     Testing performed using the Luminex xTAG Respiratory Viral Panel test     kit.     This test was developed and its performance characteristics determined     by [facility]. It has not been cleared  or approved by the [place]     Food and Drug Administration. This test is used for clinical purposes.     It should not be regarded as investigational or for research. This     laboratory is certified under the [place] (CLIA) as qualified to perform high complexity     clinical laboratory testing.     Performed at [facility]   Studies/Results: Dg Chest 2 View  05/04/2014   CLINICAL DATA:  Follow-up right base opacity  EXAM: CHEST  2 VIEW  COMPARISON:  05/02/2014  FINDINGS: Cardiomegaly scattered cardiomegaly again noted. Study is limited by patient's large body habitus. Persistent streaky right basilar atelectasis or infiltrate. No pulmonary edema. Mild streaky atelectasis left midlung. Central vascular congestion. Bony thorax is stable.  IMPRESSION: Persistent streaky right basilar atelectasis or infiltrate. Central vascular congestion without pulmonary edema.   Electronically Signed   By: Lahoma Crocker M.D.   On: 05/04/2014 09:40   Dg Chest 2 View  05/02/2014   CLINICAL DATA:  Cough and shortness of breath for 3 days with history of  asthma hypertension diabetes COPD and CHF as well as smoking history  EXAM: CHEST  2 VIEW  COMPARISON:  NM MYOCAR MULTI w/SPECT w/WALL MOTION / EF dated 09/19/2013; DG CHEST 2 VIEW dated 09/16/2013  FINDINGS: There is mild cardiac enlargement. There is vascular congestion. The central pulmonary arteries are prominent. There is hazy perihilar opacity on the right. Left lung appears clear. No pleural effusions. There is bilateral perihilar peribronchial wall thickening.  IMPRESSION: 1. Cardiac enlargement with vascular congestion. Mild hazy right perihilar opacity noted, possibly representing mild asymmetric pulmonary edema. Developing pneumonia not excluded. 2. Prominent central pulmonary arteries suggesting the possibility of pulmonary arterial hypertension.   Electronically Signed   By: Skipper Cliche M.D.   On: 05/02/2014 15:31   Medications:  Scheduled Meds: . atenolol  100 mg Oral Daily  . azithromycin  250 mg Oral Daily  . baclofen  10 mg Oral TID  . benazepril  40 mg Oral Daily  . buPROPion  150 mg Oral BID  . clindamycin  600 mg Oral 3 times per day  . diphenhydrAMINE  25 mg Oral 4 times per day  . enoxaparin (LOVENOX) injection  60 mg Subcutaneous Q24H  . furosemide  160 mg Oral Daily  . guaiFENesin  600 mg Oral TID  . insulin aspart protamine- aspart  135 Units Subcutaneous Q breakfast  . insulin aspart protamine- aspart  70 Units Subcutaneous Q supper  . ipratropium-albuterol  3 mL Nebulization TID  . methylPREDNISolone (SOLU-MEDROL) injection  60 mg Intravenous 3 times per day  . pantoprazole  40 mg Oral QAC breakfast  . pregabalin  200 mg Oral TID  . silver sulfADIAZINE   Topical Daily  . simvastatin  20 mg Oral q1800  . sodium chloride  3 mL Intravenous Q12H   Continuous Infusions:  PRN Meds:.albuterol, benzonatate, oxyCODONE-acetaminophen, traZODone Assessment/Plan: 53 y.o with h/o med noncompliance, chronic medical conditions presented with cough and sob since Sunday prior to  admission   #SOB -etiology could be 2/2 asthma, tobacco abuse, URI (viral) though RSV panel negative, N/C with Lasix for chronic diastolic dysfunction.  There is also concern for aspiration.  Repeat CXR with persistent streaky right basilar atelectasis or infiltrate. Central vascular congestion without pulmonary edema. -will continue Z pack, Added Clindamycin for aspiration coverage (will reassess in the am) -ABG not really  impressive today  -continue Duonebs, Supp O2, Mucinex, prn Albuterol neb -added PEF -will changed po steroids to Solumedrol 60 tid (reassess in am) -Pending PT/OT, will ambulate with pulse ox in the am   #Type II diabetes mellitus with peripheral circulatory disorders, uncontrolled (250.72) -monitor cbg on 70/30 135 qam and 70 qpm   #HYPERTENSION -continue Atenolol  100 mg qd, Lotensin 40 mg qd, Lasix 160 mg qd   #Chronic diastolic heart failure -Continue Lasix 160 mg daily  -strict i/o, daily weights   #Tobacco abuse -encouraged smoking cessation   #chronic pain syndrome -Continue Baclofen, Lyrica, Percocet   #Right leg pain -US doppler right lower leg r/o DVT     Dispo: Disposition is deferred at this time, awaiting improvement of current medical problems.  Anticipated discharge in approximately 1-2 day(s).   The patient does have a current PCP Bartholomew Crews, MD) and does need an [facility] hospital follow-up appointment after discharge.  The patient does not have transportation limitations that hinder transportation to clinic appointments.  .Services Needed at time of discharge: Y = Yes, Blank = No PT:   OT:   RN:   Equipment:   Other:     LOS: 2 days   Cresenciano Genre, MD [phone] 05/04/2014, 10:51 AM

## 2014-05-04 NOTE — Progress Notes (Signed)
OT Cancellation Note  Patient Details Name: Jennifer [name] MRN: [id number] DOB: [birth year]   Cancelled Treatment:    Reason Eval/Treat Not Completed: Fatigue/lethargy limiting ability to participate. Pt declining OOB, stating she felt too bad. Will continue to follow.  Haze Boyden Phuc Kluttz 05/04/2014, 10:26 AM

## 2014-05-04 NOTE — Progress Notes (Signed)
Subjective:Patient was lying down in her bed, still not feeling better, C/O excessive cold-told nurse to give her more blanket.Still having productive cough with dirty white sputum. Subjective feeling of fever although afibrile on vital check. C/O right leg pain radiating to her right lower abdomen, 10/10 in intensity, says that she is unable to move her leg, it feels heavy. Also C/O central chest and epigastric pain aggrevated with coughing and relieved in between, non radiating, Looks more like musculoskeleton due to excessive cough. Denies any palpitation, N/V or dysuria.Still SOB with audible wheezing. On 3L of O2 through nasal canula.  Objective: Vital signs in last 24 hours: Filed Vitals:   05/04/14 0755 05/04/14 0905 05/04/14 1041 05/04/14 1227  BP:   186/70   Pulse: 72  72   Temp:  99.4 F (37.4 C)    TempSrc:  Oral    Resp: 20     Height:      Weight:      SpO2: 92%   99%   Weight change: 4.082 kg (9 lb)  Intake/Output Summary (Last 24 hours) at 05/04/14 1420 Last data filed at 05/04/14 1203  Gross per 24 hour  Intake    723 ml  Output    900 ml  Net   -177 ml   BP 186/70  Pulse 72  Temp(Src) 99.4 F (37.4 C) (Oral)  Resp 20  Ht 5' 7"  (1.702 m)  Wt 124.286 kg (274 lb)  BMI 42.90 kg/m2  SpO2 99%  General Appearance:    Alert, cooperative, in distress.  Head:    Normocephalic, without obvious abnormality, atraumatic  Eyes:    PERRL, conjunctiva/corneas clear, EOM's intact,   Throat:   Lips, mucosa, and tongue normal; teeth and gums normal  Back:     Symmetric, no curvature, ROM normal, no CVA tenderness  Lungs:     Bilateral expiratory wheezing.  Chest Wall:    No tenderness or deformity   Heart:    Regular rate and rhythm, S1 and S2 normal, no murmur, rub   or gallop. ABDOMEN:Soft, non tender, no organomegaly.BS +.     Extremities:   Right swollen leg 3+ oedema, tense. 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     Skin:   Skin color, texture,  turgor normal,         Lab Results: @LABTEST2 @ Micro Results: Recent Results (from the past 240 hour(s))  RESPIRATORY VIRUS PANEL     Status: None   Collection Time    05/02/14  9:06 PM      Result Value Ref Range Status   Source - RVPAN NASAL SWAB   Corrected   Comment: CORRECTED ON 05/13 AT 1715: PREVIOUSLY REPORTED AS NASAL SWAB   Respiratory Syncytial Virus A NOT DETECTED   Final   Respiratory Syncytial Virus B NOT DETECTED   Final   Influenza A NOT DETECTED   Final   Influenza B NOT DETECTED   Final   Parainfluenza 1 NOT DETECTED   Final   Parainfluenza 2 NOT DETECTED   Final   Parainfluenza 3 NOT DETECTED   Final   Metapneumovirus NOT DETECTED   Final   Rhinovirus NOT DETECTED   Final   Adenovirus NOT DETECTED   Final   Influenza A H1 NOT DETECTED   Final   Influenza A H3 NOT DETECTED   Final   Comment: (NOTE)           Normal Reference Range  for each Analyte: NOT DETECTED     Testing performed using the Luminex xTAG Respiratory Viral Panel test     kit.     This test was developed and its performance characteristics determined     by [facility]. It has not been cleared or approved by the [place]     Food and Drug Administration. This test is used for clinical purposes.     It should not be regarded as investigational or for research. This     laboratory is certified under the [place] (CLIA) as qualified to perform high complexity     clinical laboratory testing.     Performed at [facility]   Studies/Results: Dg Chest 2 View  05/04/2014   CLINICAL DATA:  Follow-up right base opacity  EXAM: CHEST  2 VIEW  COMPARISON:  05/02/2014  FINDINGS: Cardiomegaly scattered cardiomegaly again noted. Study is limited by patient's large body habitus. Persistent streaky right basilar atelectasis or infiltrate. No pulmonary edema. Mild streaky atelectasis left midlung. Central vascular congestion. Bony thorax is stable.   IMPRESSION: Persistent streaky right basilar atelectasis or infiltrate. Central vascular congestion without pulmonary edema.   Electronically Signed   By: Lahoma Crocker M.D.   On: 05/04/2014 09:40   Dg Chest 2 View  05/02/2014   CLINICAL DATA:  Cough and shortness of breath for 3 days with history of asthma hypertension diabetes COPD and CHF as well as smoking history  EXAM: CHEST  2 VIEW  COMPARISON:  NM MYOCAR MULTI w/SPECT w/WALL MOTION / EF dated 09/19/2013; DG CHEST 2 VIEW dated 09/16/2013  FINDINGS: There is mild cardiac enlargement. There is vascular congestion. The central pulmonary arteries are prominent. There is hazy perihilar opacity on the right. Left lung appears clear. No pleural effusions. There is bilateral perihilar peribronchial wall thickening.  IMPRESSION: 1. Cardiac enlargement with vascular congestion. Mild hazy right perihilar opacity noted, possibly representing mild asymmetric pulmonary edema. Developing pneumonia not excluded. 2. Prominent central pulmonary arteries suggesting the possibility of pulmonary arterial hypertension.   Electronically Signed   By: Skipper Cliche M.D.   On: 05/02/2014 15:31   Medications: medication reviewed IV prednisone and oral clindamycin added. Scheduled Meds: . atenolol  100 mg Oral Daily  . azithromycin  250 mg Oral Daily  . baclofen  10 mg Oral TID  . benazepril  40 mg Oral Daily  . buPROPion  150 mg Oral BID  . clindamycin  600 mg Oral 3 times per day  . diphenhydrAMINE  25 mg Oral 4 times per day  . enoxaparin (LOVENOX) injection  60 mg Subcutaneous Q24H  . furosemide  160 mg Oral Daily  . guaiFENesin  600 mg Oral TID  . insulin aspart protamine- aspart  135 Units Subcutaneous Q breakfast  . insulin aspart protamine- aspart  70 Units Subcutaneous Q supper  . ipratropium-albuterol  3 mL Nebulization TID  . methylPREDNISolone (SOLU-MEDROL) injection  60 mg Intravenous 3 times per day  . pantoprazole  40 mg Oral QAC breakfast  . pregabalin   200 mg Oral TID  . silver sulfADIAZINE   Topical Daily  . simvastatin  20 mg Oral q1800  . sodium chloride  3 mL Intravenous Q12H   Continuous Infusions:  PRN Meds:.albuterol, benzonatate, oxyCODONE-acetaminophen, traZODone Assessment/Plan: SOB: Pt. Is still feeing sick,cough with dirty white sputum and audible wheeze. Most probably due to exacerbation of asthma with aspiration pneumonia as her RLL  infiltrate persists in her current CXR. Start her on IV prednisone instead of oral 60Q8h. And also add clindamycin 600 mg Q8h for anaerobe coverage. Check ABG to see her CO2 level . RIGHT LEG PAIN : As RLE was quite swollen and tense-will do Doppler studies to rule out any DVT. LEFT STUMP WOUND: Continue with silvadene dressing QD as advised by Dr. Sharol Given. Mild acute on chronic grade 1 diastolic CHF: Continue with her home lasix of 173m QD . Might have some exacerbation as she was not using lasix at home due to excessive urination. Already restarted her lasix. DM:Her todays CBG was 80 and 108 . Looks controlled_ keep monitoring and continue with current novolog. HTN: Her last BP reading is 152/60. Continue with current meds. HYPERLIPIDEMIA: Continue with current home meds.    This is a MCareers information officerNote.  The care of the patient was discussed with Dr. BEllwood Denseand the assessment and plan formulated with their assistance.  Please see their attached note for official documentation of the daily encounter.   LOS: 2 days   SLorella Nimrod Med Student 05/04/2014, 2:20 PM

## 2014-05-04 NOTE — Progress Notes (Signed)
VASCULAR LAB PRELIMINARY  PRELIMINARY  PRELIMINARY  PRELIMINARY  Right lower extremity venous Doppler completed.    Preliminary report:  There is no obvious evidence of DVT or SVT noted in the right lower extremity.  Iantha Fallen, RVT 05/04/2014, 2:33 PM

## 2014-05-04 NOTE — Consult Note (Addendum)
WOC wound consult note Reason for Consult: Consult requested for left stump wounds.  Pt had previous amputation to left leg which healed but then recently fell and developed 2 full thickness wounds.  She is followed as an outpatient by Dr Sharol Given of the ortho service and he assessed wounds last week, according to patient. Primary team at bedside to assess wounds. Wound type: Left stump with 2 full thickness wounds; 2X.5X.2cm and .5X.2X.2cm.  Both are 90% red, 10% yellow, dry with minimal amt tan drainage and no odor. Periwound: Intact skin surrounding. Dressing procedure/placement/frequency: Continue present plan of care as ordered by Dr Sharol Given with Silvadene Q day, covered with moist 4X4, ABD pad, kerlex, and ace wrap.  Pt appears to be very well-informed regarding topical treatment and can resume follow-up with ortho service after discharge. Please re-consult if further assistance is needed.  Thank-you,  Julien Girt MSN, [place], [place], [place], [place]

## 2014-05-04 NOTE — Progress Notes (Signed)
    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 She is admitted for shortness of breath.   I met with her today on morning rounds with the entire team, and she said that she was not feeling better. Her wheezing was loud and audible in the room. On exam, she appeared uncomfortable. She has wheezing with no rales, audible breath sounds. Cardiac exam withim normal limits.  She has a wound on her stump which does not look infected and is dressed. She does not have pedal or stump edema.   Pertinent Labs Today   Recent Labs Lab 05/03/14 1310 05/03/14 1738 05/03/14 2050 05/04/14 0544 05/04/14 1129  GLUCAP 100* 98 158* 80 108*    Assessment and Plan   Shortness of breath - As suggested on admission, this could be multifactorial in this patient - asthma exacerbation and aspiration could be the most likely causes given past history. Given clinical condition today, we will escalate steroids to IV solumedrol 60 q8, and start clindamycin $RemoveBeforeDEI'600mg'[id number]$  IV Q8 empirically for possibility of aspiration. Fluid overload may have some role as the patient reports missing her lasix for the past few days. I would keep what she is on now and continue to monitor. Also, ABG to assess level of hypercapnia.   Other chronic issues per resident note.  I have discussed the care of this patient with my IM team residents. Please see the resident note for details.  Mirna Sutcliffe 05/04/2014, 1:49 PM.

## 2014-05-05 LAB — BASIC METABOLIC PANEL
BUN: 20 mg/dL (ref 6–23)
CO2: 29 mEq/L (ref 19–32)
Calcium: 9.4 mg/dL (ref 8.4–10.5)
Chloride: 98 mEq/L (ref 96–112)
Creatinine, Ser: 0.73 mg/dL (ref 0.50–1.10)
GFR calc Af Amer: >90 mL/min (ref >90)
GFR calc non Af Amer: >90 mL/min (ref >90)
Glucose, Bld: 274 mg/dL — ABNORMAL HIGH (ref 70–99)
Potassium: 4.2 mEq/L (ref 3.7–5.3)
Sodium: 138 mEq/L (ref 137–147)

## 2014-05-05 LAB — GLUCOSE, CAPILLARY
Glucose-Capillary: 272 mg/dL — ABNORMAL HIGH (ref 70–99)
Glucose-Capillary: 357 mg/dL — ABNORMAL HIGH (ref 70–99)
Glucose-Capillary: 201 mg/dL — ABNORMAL HIGH (ref 70–99)
Glucose-Capillary: 248 mg/dL — ABNORMAL HIGH (ref 70–99)

## 2014-05-05 MED ORDER — INSULIN ASPART 100 UNIT/ML ~~LOC~~ SOLN
0.0000 [IU] | Freq: Three times a day (TID) | SUBCUTANEOUS | Status: DC
  Administered 2014-05-05: 7 [IU] via SUBCUTANEOUS
  Administered 2014-05-05: 20 [IU] via SUBCUTANEOUS
  Administered 2014-05-06: 7 [IU] via SUBCUTANEOUS
  Administered 2014-05-06: 4 [IU] via SUBCUTANEOUS
  Administered 2014-05-06: 11 [IU] via SUBCUTANEOUS
  Administered 2014-05-07: 7 [IU] via SUBCUTANEOUS
  Administered 2014-05-07: 3 [IU] via SUBCUTANEOUS
  Administered 2014-05-08: 4 [IU] via SUBCUTANEOUS

## 2014-05-05 MED ORDER — CLINDAMYCIN HCL 300 MG PO CAPS
600.0000 mg | ORAL_CAPSULE | Freq: Three times a day (TID) | ORAL | Status: DC
  Administered 2014-05-05 – 2014-05-08 (×10): 600 mg via ORAL
  Filled 2014-05-05 (×13): qty 2

## 2014-05-05 MED ORDER — INSULIN ASPART 100 UNIT/ML ~~LOC~~ SOLN
6.0000 [IU] | Freq: Three times a day (TID) | SUBCUTANEOUS | Status: DC
  Administered 2014-05-05 – 2014-05-06 (×5): 6 [IU] via SUBCUTANEOUS

## 2014-05-05 MED ORDER — HYDRALAZINE HCL 20 MG/ML IJ SOLN: 5.0000 mg | INTRAMUSCULAR | Status: DC | PRN

## 2014-05-05 MED ORDER — PREDNISONE 50 MG PO TABS
60.0000 mg | ORAL_TABLET | Freq: Every day | ORAL | Status: DC
  Administered 2014-05-06 – 2014-05-08 (×3): 60 mg via ORAL
  Filled 2014-05-05 (×4): qty 1

## 2014-05-05 MED ORDER — METHYLPREDNISOLONE SODIUM SUCC 125 MG IJ SOLR
60.0000 mg | Freq: Two times a day (BID) | INTRAMUSCULAR | Status: AC
  Administered 2014-05-05 – 2014-05-06 (×2): 60 mg via INTRAVENOUS
  Filled 2014-05-05 (×2): qty 0.96

## 2014-05-05 NOTE — Progress Notes (Signed)
Subjective: Pt. Was sitting in her wheel chair, feeling better,cough has decreased,and her breathing is better. She was comfortable with out her O2 as she said she took that off about 15 min. Ago to get washed.Leg pain has improved. C/O pain in her back mole, which she said started as a small mole, pregressivly increasing in size and has become raised since then too has recently become painful esp. When she lye flat on bed. Objective: Vital signs in last 24 hours: Filed Vitals:   05/05/14 0547 05/05/14 0922 05/05/14 1400 05/05/14 1500  BP: 176/63  195/80 174/44  Pulse: 60  67   Temp: 97.5 F (36.4 C)  97.8 F (36.6 C)   TempSrc: Oral  Oral   Resp: 18  18   Height:      Weight: 125.193 kg (276 lb)     SpO2: 98% 92% 95%    Weight change: 0.907 kg (2 lb)  Intake/Output Summary (Last 24 hours) at 05/05/14 1545 Last data filed at 05/05/14 1451  Gross per 24 hour  Intake   1160 ml  Output   4152 ml  Net  -2992 ml   EXAM:  GENERAL: comfortable,not in distress HEENT: AT/[place]/ PERRL, EOMI , NECK: Supple, no adenopathy, no thyromegaly. No JVD. LUNG: few scattered wheeze, improved air entry. HEART: rrr, S1 and S2 , no MRG ABD: soft, non distended, no organomegaly. BS +ve. SKIN: Tender raised mole with regular borders and scaly appearance on her middle back.No erythema. EXT: Right pedal oedema 2+, left BKA Lab Results: @LABTEST2 @ Micro Results: Recent Results (from the past 240 hour(s))  RESPIRATORY VIRUS PANEL     Status: None   Collection Time    05/02/14  9:06 PM      Result Value Ref Range Status   Source - RVPAN NASAL SWAB   Corrected   Comment: CORRECTED ON 05/13 AT 1715: PREVIOUSLY REPORTED AS NASAL SWAB   Respiratory Syncytial Virus A NOT DETECTED   Final   Respiratory Syncytial Virus B NOT DETECTED   Final   Influenza A NOT DETECTED   Final   Influenza B NOT DETECTED   Final   Parainfluenza 1 NOT DETECTED   Final   Parainfluenza 2 NOT DETECTED   Final   Parainfluenza 3  NOT DETECTED   Final   Metapneumovirus NOT DETECTED   Final   Rhinovirus NOT DETECTED   Final   Adenovirus NOT DETECTED   Final   Influenza A H1 NOT DETECTED   Final   Influenza A H3 NOT DETECTED   Final   Comment: (NOTE)           Normal Reference Range for each Analyte: NOT DETECTED     Testing performed using the Luminex xTAG Respiratory Viral Panel test     kit.     This test was developed and its performance characteristics determined     by [facility]. It has not been cleared or approved by the [place]     Food and Drug Administration. This test is used for clinical purposes.     It should not be regarded as investigational or for research. This     laboratory is certified under the [place] (CLIA) as qualified to perform high complexity     clinical laboratory testing.     Performed at [facility]   Studies/Results: Dg Chest 2 View  05/04/2014   CLINICAL DATA:  Follow-up  right base opacity  EXAM: CHEST  2 VIEW  COMPARISON:  05/02/2014  FINDINGS: Cardiomegaly scattered cardiomegaly again noted. Study is limited by patient's large body habitus. Persistent streaky right basilar atelectasis or infiltrate. No pulmonary edema. Mild streaky atelectasis left midlung. Central vascular congestion. Bony thorax is stable.  IMPRESSION: Persistent streaky right basilar atelectasis or infiltrate. Central vascular congestion without pulmonary edema.   Electronically Signed   By: Lahoma Crocker M.D.   On: 05/04/2014 09:40   Medications: medication reviewed Scheduled Meds: . atenolol  100 mg Oral Daily  . azithromycin  250 mg Oral Daily  . baclofen  10 mg Oral TID  . benazepril  40 mg Oral Daily  . buPROPion  150 mg Oral BID  . clindamycin  600 mg Oral 3 times per day  . diphenhydrAMINE  25 mg Oral 4 times per day  . enoxaparin (LOVENOX) injection  60 mg Subcutaneous Q24H  . furosemide  160 mg Oral Daily  . guaiFENesin  600 mg Oral TID    . insulin aspart  0-20 Units Subcutaneous TID WC  . insulin aspart  6 Units Subcutaneous TID WC  . insulin aspart protamine- aspart  135 Units Subcutaneous Q breakfast  . insulin aspart protamine- aspart  70 Units Subcutaneous Q supper  . ipratropium-albuterol  3 mL Nebulization TID  . methylPREDNISolone (SOLU-MEDROL) injection  60 mg Intravenous Q12H  . pantoprazole  40 mg Oral QAC breakfast  . [START ON 05/06/2014] predniSONE  60 mg Oral Q breakfast  . pregabalin  200 mg Oral TID  . silver sulfADIAZINE   Topical Daily  . simvastatin  20 mg Oral q1800  . sodium chloride  3 mL Intravenous Q12H   Continuous Infusions:  PRN Meds:.albuterol, benzonatate, oxyCODONE-acetaminophen, traZODone Assessment/Plan: SOB:. Most probably due to exacerbation of asthma with aspiration pneumonia as her RLL infiltrate persists in her current CXR.As pt. Seems responding to IV prednisone and clindamycin, continue with IV one more day and then try to switch to oral. Continue clindamycin for 7 days. RIGHT LEG PAIN : Right leg pain improved, Doppler -ev. Continue with lasix . LEFT STUMP WOUND: Continue with silvadene dressing QD as advised by Dr. Sharol Given.  Mild acute on chronic grade 1 diastolic CHF: Continue with her home lasix of 161m QD . DM:Her todays CBG was 227 with one reading of 447 last night, most probably due to increased dose of prednisone.   keep monitoring and add sliding scale for better control. HTN: Her last BP reading is 176/63. Continue with current meds.  HYPERLIPIDEMIA: Continue with current home meds. MOLE on her back: Follow up with dermatology as OP.       This is a MCareers information officerNote.  The care of the patient was discussed with Dr. MAundra Dubinand the assessment and plan formulated with their assistance.  Please see their attached note for official documentation of the daily encounter.   LOS: 3 days   SLorella Nimrod Med Student 05/05/2014, 3:45 PM

## 2014-05-05 NOTE — Progress Notes (Signed)
Attempt ed to assist pt with dsg. Changed to Lt stump after requested for ace wrap, which is given to pt.  Decline assistance and instructed she will do it when she is ready.  Will continue to monitor.  Karie Kirks, Therapist, sports.

## 2014-05-05 NOTE — Progress Notes (Signed)
Agree with med student note see my note for more details   Aundra Dubin MD

## 2014-05-05 NOTE — Progress Notes (Signed)
Reviewed

## 2014-05-05 NOTE — Progress Notes (Signed)
Pt requests a Proventil MDI inhaler @ [facility] as she is out at home.

## 2014-05-05 NOTE — Evaluation (Addendum)
Occupational Therapy Evaluation Patient Details Name: Jennifer [name] MRN: [id number] DOB: 07/04/1961 Today's Date: 05/05/2014    History of Present Illness 53yo female adm 05/02/14 with SOB due to pulmondary edema and asthma exacerbation; PMHx: DM, arthritis, HTN and PVD, L BKA; dopper neg for DVT   Clinical Impression   Pt is very independent with noted decreased independence with this hospital admission secondary to SOB, chronic pain and decreased endurance. She should benefit from acute OT to assist in maximizing independence, increased activity tolerance, endurance for functional transfers and ADL's. Recommend HHOT.     Follow Up Recommendations  Home health OT;Supervision - Intermittent    Equipment Recommendations  Tub/shower bench    Recommendations for Other Services       Precautions / Restrictions Precautions Precautions: Fall Restrictions Weight Bearing Restrictions: No      Mobility Bed Mobility Overal bed mobility: Modified Independent                Transfers Overall transfer level: Needs assistance Equipment used: None (bedrail) Transfers: Sit to/from Stand;Stand Pivot Transfers Sit to Stand: Min guard;Supervision Stand pivot transfers: Supervision       General transfer comment: for safety; pt has her own way of doing things; she does a 180*turn with BSC directly in front of her--this is what she does at home per her report; seh did have LOB x 1 with min/guard recovery and sat back onto bed; she performs a 90*turn and WBs on her residual limb (on the bed) to perform  hygiene after toileting    Balance Overall balance assessment: Needs assistance Sitting-balance support: No upper extremity supported;Feet supported Sitting balance-Leahy Scale: Good     Standing balance support: During functional activity;Single extremity supported Standing balance-Leahy Scale: Fair                              ADL Overall ADL's : Needs  assistance/impaired Eating/Feeding: Independent;Sitting (Sitting EOB)   Grooming: Set up;Sitting Grooming Details (indicate cue type and reason):  (Pt fatigues easily secondary to SOB and pain) Upper Body Bathing: Set up Upper Body Bathing Details (indicate cue type and reason):  (Decreased endurance, pain and SOB limiting independence) Lower Body Bathing: Set up;Minimal assistance Lower Body Bathing Details (indicate cue type and reason):  (Pt sponge bathes at home sitting on commode, can reach sink. ) Upper Body Dressing : Set up;Sitting   Lower Body Dressing: Sitting/lateral leans;Sit to/from stand;Min guard;Minimal assistance   Toilet Transfer: Stand-pivot;BSC;Min guard;Supervision/safety (BSC at bedside, pt pivots 180* "Thats how I do it at home, Don't help me")   Toileting- Clothing Manipulation and Hygiene: Supervision/safety;Min guard         General ADL Comments:  (Pt is very independent with noted decreased independence with this hospital admission secondary to SOB, chronic pain and decreased endurance. She should benefit from acute OT to assist in maximizing independence and increased activity tolerance, endurance for functional transfers and ADL's. After assessment, Pt reluctantly participated in ADL retraining session for toileting transfers and verbal education on Role of OT, ADL's and energy conservation.     Vision  Pt wears glasses for reading, "I need other glasses but don't have them"                   Perception     Praxis      Pertinent Vitals/Pain Pt reports chronic pain as 10.5/10. RN in room and aware and gave  pain medication, pt repositioned in bed after session.     Hand Dominance Right   Extremity/Trunk Assessment Upper Extremity Assessment Upper Extremity Assessment: Generalized weakness   Lower Extremity Assessment Lower Extremity Assessment: Defer to PT evaluation;RLE deficits/detail;LLE deficits/detail RLE Deficits / Details: AROM  grossly WFL, able to stand/WB with UE support, edematous LLE Deficits / Details: LBKA with wounds       Communication Communication Communication: No difficulties   Cognition Arousal/Alertness: Awake/alert Behavior During Therapy: WFL for tasks assessed/performed Overall Cognitive Status: Within Functional Limits for tasks assessed                     General Comments              Shoulder Instructions      Home Living Family/patient expects to be discharged to:: Private residence Living Arrangements: Children Available Help at Discharge: Family;Available PRN/intermittently Type of Home: Apartment Home Access: Level entry;Other (comment) (back entry level but there are tree roots)     Home Layout: One level     Bathroom Shower/Tub: Tub/shower unit;Other (comment) (Pt sponge bathes at sink)   Biochemist, clinical: Standard     Home Equipment: Wheelchair - [employer] - 2 wheels;Bedside commode;Walker - 4 wheels   Additional Comments: pt reports she can go around to backdoor and does not have any steps  but has multiple tree roots/stumps      Prior Functioning/Environment Level of Independence: Independent with assistive device(s)        Comments: w/c is 1* mode of locomotion, she does amb into bathroom with RW at times    OT Diagnosis: Generalized weakness   OT Problem List: Decreased strength;Decreased activity tolerance;Impaired balance (sitting and/or standing);Decreased knowledge of use of DME or AE;Pain;Cardiopulmonary status limiting activity   OT Treatment/Interventions: Self-care/ADL training;DME and/or AE instruction;Patient/family education;Therapeutic activities;Balance training;Energy conservation    OT Goals(Current goals can be found in the care plan section) Acute Rehab OT Goals Patient Stated Goal: Go home and do things on my own Time For Goal Achievement: 05/12/14 Potential to Achieve Goals: Good  OT Frequency: Min 3X/week   Barriers  to D/C:                         End of Session Nurse Communication: Mobility status  Activity Tolerance: Patient tolerated treatment well Patient left: in bed;with call bell/phone within reach   Time: 0800-0827 OT Time Calculation (min): 27 min Charges:  OT General Charges $OT Visit: 1 Procedure OT Evaluation $Initial OT Evaluation Tier I: 1 Procedure OT Treatments $Self Care/Home Management : 8-22 mins G-Codes:    Ciel Yanes B Nataline Basara May 23, 2014, 8:31 AM

## 2014-05-05 NOTE — Progress Notes (Signed)
Subjective: Pt feeling better today eating lunch and removed nasal canula.  Coughing is better   Objective: Vital signs in last 24 hours: Filed Vitals:   05/04/14 2017 05/04/14 2100 05/05/14 0547 05/05/14 0922  BP:  168/55 176/63   Pulse:  77 60   Temp:  98 F (36.7 C) 97.5 F (36.4 C)   TempSrc:  Oral Oral   Resp:  18 18   Height:      Weight:   276 lb (125.193 kg)   SpO2: 98% 94% 98% 92%   Weight change: 2 lb (0.907 kg)  Intake/Output Summary (Last 24 hours) at 05/05/14 1301 Last data filed at 05/05/14 0900  Gross per 24 hour  Intake   1160 ml  Output   4151 ml  Net  -2991 ml   Vitals reviewed. General: sitting up in bed, NAD HEENT: [place]/at, no scleral icterus Cardiac: RRR, no rubs, murmurs or gallops Pulm: decreased wheezing b/l  Abd: soft, nontender, nondistended, BS present Ext: warm and well perfused, trace to 1+ pedal edema, left BKA with wound previously noted  Neuro: alert and oriented X3, moving extremities   Lab Results: Basic Metabolic Panel:  Recent Labs Lab 05/02/14 1444 05/05/14 0431  NA 140 138  K 4.3 4.2  CL 100 98  CO2 27 29  GLUCOSE 255* 274*  BUN 9 20  CREATININE 0.63 0.73  CALCIUM 9.0 9.4   CBC:  Recent Labs Lab 05/02/14 1444  WBC 6.7  HGB 11.2*  HCT 35.1*  MCV 91.2  PLT 140*   BNP:  Recent Labs Lab 05/02/14 1444  PROBNP 495.6*   CBG:  Recent Labs Lab 05/04/14 1129 05/04/14 1610 05/04/14 2143 05/04/14 2358 05/05/14 0554 05/05/14 1144  GLUCAP 108* 223* 447* 359* 272* 357*   Hemoglobin A1C:  Recent Labs Lab 05/03/14 0820  HGBA1C 7.4*   Misc. Labs: none  Micro Results: Recent Results (from the past 240 hour(s))  RESPIRATORY VIRUS PANEL     Status: None   Collection Time    05/02/14  9:06 PM      Result Value Ref Range Status   Source - RVPAN NASAL SWAB   Corrected   Comment: CORRECTED ON 05/13 AT 1715: PREVIOUSLY REPORTED AS NASAL SWAB   Respiratory Syncytial Virus A NOT DETECTED   Final   Respiratory Syncytial Virus B NOT DETECTED   Final   Influenza A NOT DETECTED   Final   Influenza B NOT DETECTED   Final   Parainfluenza 1 NOT DETECTED   Final   Parainfluenza 2 NOT DETECTED   Final   Parainfluenza 3 NOT DETECTED   Final   Metapneumovirus NOT DETECTED   Final   Rhinovirus NOT DETECTED   Final   Adenovirus NOT DETECTED   Final   Influenza A H1 NOT DETECTED   Final   Influenza A H3 NOT DETECTED   Final   Comment: (NOTE)           Normal Reference Range for each Analyte: NOT DETECTED     Testing performed using the Luminex xTAG Respiratory Viral Panel test     kit.     This test was developed and its performance characteristics determined     by [facility]. It has not been cleared or approved by the [place]     Food and Drug Administration. This test is used for clinical purposes.     It should not be regarded as investigational or for research.  This     laboratory is certified under the [place] (CLIA) as qualified to perform high complexity     clinical laboratory testing.     Performed at [facility]   Studies/Results: Dg Chest 2 View  05/04/2014   CLINICAL DATA:  Follow-up right base opacity  EXAM: CHEST  2 VIEW  COMPARISON:  05/02/2014  FINDINGS: Cardiomegaly scattered cardiomegaly again noted. Study is limited by patient's large body habitus. Persistent streaky right basilar atelectasis or infiltrate. No pulmonary edema. Mild streaky atelectasis left midlung. Central vascular congestion. Bony thorax is stable.  IMPRESSION: Persistent streaky right basilar atelectasis or infiltrate. Central vascular congestion without pulmonary edema.   Electronically Signed   By: Lahoma Crocker M.D.   On: 05/04/2014 09:40   Medications:  Scheduled Meds: . atenolol  100 mg Oral Daily  . azithromycin  250 mg Oral Daily  . baclofen  10 mg Oral TID  . benazepril  40 mg Oral Daily  . buPROPion  150 mg Oral BID  . diphenhydrAMINE   25 mg Oral 4 times per day  . enoxaparin (LOVENOX) injection  60 mg Subcutaneous Q24H  . furosemide  160 mg Oral Daily  . guaiFENesin  600 mg Oral TID  . insulin aspart  0-20 Units Subcutaneous TID WC  . insulin aspart  6 Units Subcutaneous TID WC  . insulin aspart protamine- aspart  135 Units Subcutaneous Q breakfast  . insulin aspart protamine- aspart  70 Units Subcutaneous Q supper  . ipratropium-albuterol  3 mL Nebulization TID  . methylPREDNISolone (SOLU-MEDROL) injection  60 mg Intravenous Q12H  . pantoprazole  40 mg Oral QAC breakfast  . [START ON 05/06/2014] predniSONE  60 mg Oral Q breakfast  . pregabalin  200 mg Oral TID  . silver sulfADIAZINE   Topical Daily  . simvastatin  20 mg Oral q1800  . sodium chloride  3 mL Intravenous Q12H   Continuous Infusions:  PRN Meds:.albuterol, benzonatate, oxyCODONE-acetaminophen, traZODone Assessment/Plan: 53 y.o with h/o med noncompliance, chronic medical conditions presented with cough and sob since Sunday prior to admission   #SOB likely 2/2 asthma vs aspiration -etiology could be 2/2 asthma exacerbation vs aspiration likely with ongoing tobacco abuse, URI (viral) though RSV panel negative, N/C with Lasix for chronic diastolic dysfunction.   -will continue Z pack, Added Clindamycin for aspiration coverage to continue for 6 more days total 7 days -continue Duonebs, Supp O2, Mucinex, prn Albuterol neb -PEF -will changed po steroids in the am.  Solumedrol 60 bid x 1 day -repeat CXR in the am    #Type II diabetes mellitus with peripheral circulatory disorders, uncontrolled (250.72) -monitor cbg on 70/30 135 qam and 70 qpm  -SSI R and 6 units with meals   #HYPERTENSION -continue Atenolol  100 mg qd, Lotensin 40 mg qd, Lasix 160 mg qd   #Chronic diastolic heart failure -Continue Lasix 160 mg daily  -strict i/o, daily weights   #Tobacco abuse -encouraged smoking cessation   #chronic pain syndrome -Continue Baclofen, Lyrica,  Percocet   #Right leg pain -US doppler right lower leg negative   #F/E/N -NSL -carb mod   #DVT px  -Lov    Dispo: Disposition is deferred at this time, awaiting improvement of current medical problems.  Anticipated discharge in approximately 1 day(s).   The patient does have a current PCP Bartholomew Crews, MD) and does need an [facility] hospital follow-up appointment after discharge.  The patient does not have transportation limitations that hinder transportation to clinic appointments.  .Services Needed at time of discharge: Y = Yes, Blank = No PT: H/H  OT: H/H  RN:   Equipment: Tub bench  Other:     LOS: 3 days   Cresenciano Genre, MD [phone] 05/05/2014, 1:01 PM

## 2014-05-05 NOTE — Progress Notes (Signed)
Pt BP 195/88 with machine and manually 174/74.  Dr. Aundra Dubin made aware and instructed she will take a look at it.  Will continue to monitor.  Karie Kirks, Therapist, sports.

## 2014-05-06 ENCOUNTER — Inpatient Hospital Stay (HOSPITAL_COMMUNITY): Payer: Medicare Other

## 2014-05-06 DIAGNOSIS — R059 Cough, unspecified: Secondary | ICD-10-CM

## 2014-05-06 DIAGNOSIS — R1310 Dysphagia, unspecified: Secondary | ICD-10-CM

## 2014-05-06 DIAGNOSIS — R05 Cough: Secondary | ICD-10-CM

## 2014-05-06 LAB — GLUCOSE, CAPILLARY
Glucose-Capillary: 173 mg/dL — ABNORMAL HIGH (ref 70–99)
Glucose-Capillary: 273 mg/dL — ABNORMAL HIGH (ref 70–99)
Glucose-Capillary: 210 mg/dL — ABNORMAL HIGH (ref 70–99)
Glucose-Capillary: 332 mg/dL — ABNORMAL HIGH (ref 70–99)

## 2014-05-06 MED ORDER — ALBUTEROL SULFATE (2.5 MG/3ML) 0.083% IN NEBU: 3.0000 mL | INHALATION_SOLUTION | Freq: Four times a day (QID) | RESPIRATORY_TRACT | Status: DC | PRN

## 2014-05-06 MED ORDER — FLUTICASONE PROPIONATE 50 MCG/ACT NA SUSP
2.0000 | Freq: Every day | NASAL | Status: DC
  Administered 2014-05-06 – 2014-05-08 (×3): 2 via NASAL
  Filled 2014-05-06: qty 16

## 2014-05-06 NOTE — Progress Notes (Signed)
Met with patient - feels little better today. Lung exam shows no wheezing. Agree with change to PO steroids and complete clinda course.  I have read documentation by Dr Aundra Dubin and I agree. I have discussed the care of this patient with my resident team. Please see details of management in the resident note.    Madilyn Fireman MD MPH 05/06/2014 12:30 PM

## 2014-05-06 NOTE — Plan of Care (Signed)
Problem: Acute Rehab OT Goals (only OT should resolve) Goal: Pt. Will Perform Tub/Shower Transfer Outcome: Not Applicable Date Met:  07/62/26 Pt. Reports that she sponge bathes daily

## 2014-05-06 NOTE — Progress Notes (Addendum)
Subjective: Pt still coughing and sometimes sputum comes up and sometimes it does not come up. C/o pressure at sinuses and Flonase helps. She had a fall on her knees today after spilling coffee today and she tried to get up and clean the coffee forgot she did not have a leg on the left.  She states it hard to swallow at times and food isnt going down normally. previously had her esophagus dilated  Objective: Vital signs in last 24 hours: Filed Vitals:   05/05/14 2154 05/06/14 0224 05/06/14 0521 05/06/14 0942  BP: 177/68 171/75    Pulse: 67 60    Temp:  97.3 F (36.3 C)    TempSrc:  Oral    Resp: 18 19    Height:      Weight:   269 lb 8 oz (122.244 kg)   SpO2: 95% 95%  97%   Weight change: -6 lb 8 oz (-2.948 kg)  Intake/Output Summary (Last 24 hours) at 05/06/14 1021 Last data filed at 05/06/14 0851  Gross per 24 hour  Intake   1140 ml  Output    902 ml  Net    238 ml   Vitals reviewed. General: sitting up in wheelchair, NAD HEENT: [place]/at, no scleral icterus Cardiac: RRR, no rubs, murmurs or gallops Pulm: ctab  Abd: soft, nontender, nondistended, BS present Ext: warm and well perfused, trace to 1+ pedal edema, left BKA with wound previously noted and wrapped  Neuro: alert and oriented X3, moving extremities   Lab Results: Basic Metabolic Panel:  Recent Labs Lab 05/02/14 1444 05/05/14 0431  NA 140 138  K 4.3 4.2  CL 100 98  CO2 27 29  GLUCOSE 255* 274*  BUN 9 20  CREATININE 0.63 0.73  CALCIUM 9.0 9.4   CBC:  Recent Labs Lab 05/02/14 1444  WBC 6.7  HGB 11.2*  HCT 35.1*  MCV 91.2  PLT 140*   BNP:  Recent Labs Lab 05/02/14 1444  PROBNP 495.6*   CBG:  Recent Labs Lab 05/04/14 2358 05/05/14 0554 05/05/14 1144 05/05/14 1631 05/05/14 2105 05/06/14 0635  GLUCAP 359* 272* 357* 201* 248* 173*   Hemoglobin A1C:  Recent Labs Lab 05/03/14 0820  HGBA1C 7.4*   Misc. Labs: none  Micro Results: Recent Results (from the past 240 hour(s))    RESPIRATORY VIRUS PANEL     Status: None   Collection Time    05/02/14  9:06 PM      Result Value Ref Range Status   Source - RVPAN NASAL SWAB   Corrected   Comment: CORRECTED ON 05/13 AT 1715: PREVIOUSLY REPORTED AS NASAL SWAB   Respiratory Syncytial Virus A NOT DETECTED   Final   Respiratory Syncytial Virus B NOT DETECTED   Final   Influenza A NOT DETECTED   Final   Influenza B NOT DETECTED   Final   Parainfluenza 1 NOT DETECTED   Final   Parainfluenza 2 NOT DETECTED   Final   Parainfluenza 3 NOT DETECTED   Final   Metapneumovirus NOT DETECTED   Final   Rhinovirus NOT DETECTED   Final   Adenovirus NOT DETECTED   Final   Influenza A H1 NOT DETECTED   Final   Influenza A H3 NOT DETECTED   Final   Comment: (NOTE)           Normal Reference Range for each Analyte: NOT DETECTED     Testing performed using the Luminex xTAG Respiratory Viral Panel test  kit.     This test was developed and its performance characteristics determined     by [facility]. It has not been cleared or approved by the [place]     Food and Drug Administration. This test is used for clinical purposes.     It should not be regarded as investigational or for research. This     laboratory is certified under the [place] (CLIA) as qualified to perform high complexity     clinical laboratory testing.     Performed at [facility]   Studies/Results: No results found. Medications:  Scheduled Meds: . atenolol  100 mg Oral Daily  . baclofen  10 mg Oral TID  . benazepril  40 mg Oral Daily  . buPROPion  150 mg Oral BID  . clindamycin  600 mg Oral 3 times per day  . diphenhydrAMINE  25 mg Oral 4 times per day  . enoxaparin (LOVENOX) injection  60 mg Subcutaneous Q24H  . fluticasone  2 spray Each Nare Daily  . furosemide  160 mg Oral Daily  . guaiFENesin  600 mg Oral TID  . insulin aspart  0-20 Units Subcutaneous TID WC  . insulin aspart  6 Units  Subcutaneous TID WC  . insulin aspart protamine- aspart  135 Units Subcutaneous Q breakfast  . insulin aspart protamine- aspart  70 Units Subcutaneous Q supper  . ipratropium-albuterol  3 mL Nebulization TID  . pantoprazole  40 mg Oral QAC breakfast  . predniSONE  60 mg Oral Q breakfast  . pregabalin  200 mg Oral TID  . silver sulfADIAZINE   Topical Daily  . simvastatin  20 mg Oral q1800  . sodium chloride  3 mL Intravenous Q12H   Continuous Infusions:  PRN Meds:.albuterol, benzonatate, hydrALAZINE, oxyCODONE-acetaminophen, traZODone Assessment/Plan: 53 y.o with h/o med noncompliance, chronic medical conditions presented with cough and sob since Sunday prior to admission   #SOB likely 2/2 asthma vs aspiration -etiology could be 2/2 asthma exacerbation vs aspiration likely with ongoing tobacco abuse   -will continue Z pack (last day), added Clindamycin (day 3/7) for aspiration coverage  -continue Duonebs, Supp O2, Mucinex, prn Albuterol  -PEF 210 today  -po steroids total day 3 of steroids  -repeat CXR today  #Dysphagia  -will need outpatient f/u with GI   #Type II diabetes mellitus with peripheral circulatory disorders, uncontrolled  -monitor cbg on 70/30 135 qam and 70 qpm  -SSI R and 6 units with meals   #HYPERTENSION -continue Atenolol  100 mg qd, Lotensin 40 mg qd, Lasix 160 mg qd   #Chronic diastolic heart failure -Continue Lasix 160 mg daily  -strict i/o, daily weights   #Tobacco abuse -encouraged smoking cessation   #chronic pain syndrome -Continue Baclofen, Lyrica, Percocet   #F/E/N -NSL -carb mod   #DVT px  -Lov   Dispo: Disposition is deferred at this time, awaiting improvement of current medical problems.  Anticipated discharge in approximately 1 day(s).   The patient does have a current PCP Bartholomew Crews, MD) and does need an [facility] hospital follow-up appointment after discharge.  The patient does not have transportation limitations that hinder  transportation to clinic appointments.  .Services Needed at time of discharge: Y = Yes, Blank = No PT: H/H  OT: H/H  RN:   Equipment: Tub bench  Other:     LOS: 4 days   Cresenciano Genre, MD [phone] 05/06/2014, 10:21 AM

## 2014-05-06 NOTE — Progress Notes (Signed)
I have read and agree with above content.   05/06/2014 Luther Bradley OTR/L Pager [phone] Office [phone]

## 2014-05-06 NOTE — Progress Notes (Signed)
Nurse tech walked into room and found pt on her knees on floor. Pt states she went to get up and forgot she didn't have her prosthesis on. No obvious injury seen. 177/75 103hr.  Internal med MD paged.

## 2014-05-06 NOTE — Progress Notes (Addendum)
Occupational Therapy Treatment Patient Details Name: Jennifer [name] MRN: [id number] DOB: 1961-10-29 Today's Date: 05/06/2014    History of present illness 53yo female adm 05/02/14 with SOB due to pulmondary edema and asthma exacerbation; PMHx: DM, arthritis, HTN and PVD, L BKA; dopper neg for DVT   OT comments  Pt. Very particular about how she completes transfers and self care.  States she has a method that she completes all self care and transfers at home and would not allow therapist to assist or provide instructions today.  Able to transfer to/from bed to bsc and complete peri care with no LOB noted. States her son lives with her and assists with meal prep and other light chores.  Declines tub/shower practice states she sponge bathes daily.  Pt. Has met  acute OT goals, and states she has no further questions or concerns.  OTR/L notified for d/c from OT.    Follow Up Recommendations  Home health OT;Supervision - Intermittent    Equipment Recommendations  Tub/shower bench ? Check with pt. Because she reports that she sponge bathes         Precautions / Restrictions Precautions Precautions: Fall       Mobility Bed Mobility                  Transfers Overall transfer level: Modified independent   Transfers: Stand Pivot Transfers Sit to Stand: Modified independent (Device/Increase time);Supervision Stand pivot transfers: Modified independent (Device/Increase time);Supervision       General transfer comment: insisted on her way of completing sit/stand and pivot transfers, demanded privacy and was able to complete with no LOB noted                                       ADL Overall ADL's : Needs assistance/impaired                       Lower Body Dressing Details (indicate cue type and reason): declined return demo but states she has "tools for all  that at home" ie: reacher and sock-aide Toilet Transfer: Modified  Independent;BSC;Squat-pivot   Toileting- Clothing Manipulation and Hygiene: Modified independent;Sit to/from stand Toileting - Clothing Manipulation Details (indicate cue type and reason): pt. leans forward on bed with b ue support and reaches for back peri care also does lateral leans for front peri care   Tub/Shower Transfer Details (indicate cue type and reason): states she does sponge baths at the sink Functional mobility during ADLs: Supervision/safety General ADL Comments: has a specific way of completing all self care tasks.  mod i for bsc transfer and peri care. reports her son lives with her and completes all the cooking and can assist with cleaning/emptying bsc from her bedroom                                                                                                   Pertinent Vitals/ Pain       Denies pain  Frequency Min 3X/week     Progress Toward Goals  OT Goals(current goals can now be found in the care plan section)  Progress towards OT goals: Progressing toward goals     Plan Discharge plan remains appropriate                    End of Session     Activity Tolerance Patient tolerated treatment well   Patient Left in bed;with call bell/phone within reach             Time: 1417-1440 OT Time Calculation (min): 23 min  Charges: OT General Charges $OT Visit: 1 Procedure OT Treatments $Self Care/Home Management : 23-37 mins  [place], COTA/L 05/06/2014, 3:02 PM

## 2014-05-07 DIAGNOSIS — S88119A Complete traumatic amputation at level between knee and ankle, unspecified lower leg, initial encounter: Secondary | ICD-10-CM

## 2014-05-07 DIAGNOSIS — S99929A Unspecified injury of unspecified foot, initial encounter: Secondary | ICD-10-CM

## 2014-05-07 DIAGNOSIS — X58XXXA Exposure to other specified factors, initial encounter: Secondary | ICD-10-CM

## 2014-05-07 DIAGNOSIS — S8990XA Unspecified injury of unspecified lower leg, initial encounter: Secondary | ICD-10-CM

## 2014-05-07 DIAGNOSIS — S99919A Unspecified injury of unspecified ankle, initial encounter: Secondary | ICD-10-CM

## 2014-05-07 LAB — GLUCOSE, CAPILLARY
Glucose-Capillary: 112 mg/dL — ABNORMAL HIGH (ref 70–99)
Glucose-Capillary: 132 mg/dL — ABNORMAL HIGH (ref 70–99)
Glucose-Capillary: 237 mg/dL — ABNORMAL HIGH (ref 70–99)
Glucose-Capillary: 177 mg/dL — ABNORMAL HIGH (ref 70–99)

## 2014-05-07 MED ORDER — INSULIN LISPRO PROT & LISPRO (75-25 MIX) 100 UNIT/ML KWIKPEN: 80.0000 [IU] | PEN_INJECTOR | Freq: Two times a day (BID) | SUBCUTANEOUS | Status: DC

## 2014-05-07 MED ORDER — INSULIN ASPART PROT & ASPART (70-30 MIX) 100 UNIT/ML ~~LOC~~ SUSP
140.0000 [IU] | Freq: Every day | SUBCUTANEOUS | Status: DC
  Administered 2014-05-08: 140 [IU] via SUBCUTANEOUS

## 2014-05-07 MED ORDER — BENZONATATE 100 MG PO CAPS: 100.0000 mg | ORAL_CAPSULE | Freq: Three times a day (TID) | ORAL | Status: DC | PRN

## 2014-05-07 MED ORDER — CLINDAMYCIN HCL 300 MG PO CAPS: 600.0000 mg | ORAL_CAPSULE | Freq: Three times a day (TID) | ORAL | Status: DC

## 2014-05-07 MED ORDER — INSULIN ASPART PROT & ASPART (70-30 MIX) 100 UNIT/ML ~~LOC~~ SUSP
80.0000 [IU] | Freq: Every day | SUBCUTANEOUS | Status: DC
  Administered 2014-05-07: 80 [IU] via SUBCUTANEOUS

## 2014-05-07 MED ORDER — FUROSEMIDE 10 MG/ML IJ SOLN
INTRAMUSCULAR | Status: AC
  Filled 2014-05-07: qty 8

## 2014-05-07 MED ORDER — FLUTICASONE PROPIONATE 50 MCG/ACT NA SUSP: 2.0000 | Freq: Every day | NASAL | Status: DC

## 2014-05-07 MED ORDER — GUAIFENESIN ER 600 MG PO TB12: 600.0000 mg | ORAL_TABLET | Freq: Three times a day (TID) | ORAL | Status: DC

## 2014-05-07 MED ORDER — PREDNISONE 20 MG PO TABS: 40.0000 mg | ORAL_TABLET | Freq: Every day | ORAL | Status: DC

## 2014-05-07 MED ORDER — FUROSEMIDE 80 MG PO TABS
80.0000 mg | ORAL_TABLET | Freq: Once | ORAL | Status: AC
  Administered 2014-05-07: 80 mg via ORAL
  Filled 2014-05-07: qty 1

## 2014-05-07 NOTE — Progress Notes (Addendum)
Subjective: Patient feels somewhat better today.  She found out someone she knows had a heart attack today   Objective: Vital signs in last 24 hours: Filed Vitals:   05/06/14 1400 05/06/14 2013 05/07/14 0514 05/07/14 0748  BP: 164/74 153/73 154/74   Pulse: 65 71 65   Temp: 97.8 F (36.6 C) 97.6 F (36.4 C) 97.9 F (36.6 C)   TempSrc: Oral Oral Oral   Resp: 18 18 18    Height:      Weight:   267 lb 10.2 oz (121.4 kg)   SpO2: 95% 96% 98% 98%   Weight change: -1 lb 13.8 oz (-0.844 kg)  Intake/Output Summary (Last 24 hours) at 05/07/14 0818 Last data filed at 05/07/14 0500  Gross per 24 hour  Intake    600 ml  Output   1502 ml  Net   -902 ml   Vitals reviewed. General: sitting up in bed, NAD HEENT: [place]/at, no scleral icterus Cardiac: RRR, no rubs, murmurs or gallops Pulm: ctab  Abd: soft, nontender, nondistended, BS present Ext: warm and well perfused, trace to 1+ pedal edema, left BKA with wound previously noted and wrapped  Neuro: alert and oriented X3, moving extremities   Lab Results: Basic Metabolic Panel:  Recent Labs Lab 05/02/14 1444 05/05/14 0431  NA 140 138  K 4.3 4.2  CL 100 98  CO2 27 29  GLUCOSE 255* 274*  BUN 9 20  CREATININE 0.63 0.73  CALCIUM 9.0 9.4   CBC:  Recent Labs Lab 05/02/14 1444  WBC 6.7  HGB 11.2*  HCT 35.1*  MCV 91.2  PLT 140*   BNP:  Recent Labs Lab 05/02/14 1444  PROBNP 495.6*   CBG:  Recent Labs Lab 05/05/14 2105 05/06/14 0635 05/06/14 1125 05/06/14 1649 05/06/14 2158 05/07/14 0709  GLUCAP 248* 173* 273* 210* 332* 112*   Hemoglobin A1C:  Recent Labs Lab 05/03/14 0820  HGBA1C 7.4*   Misc. Labs: none  Micro Results: Recent Results (from the past 240 hour(s))  RESPIRATORY VIRUS PANEL     Status: None   Collection Time    05/02/14  9:06 PM      Result Value Ref Range Status   Source - RVPAN NASAL SWAB   Corrected   Comment: CORRECTED ON 05/13 AT 1715: PREVIOUSLY REPORTED AS NASAL SWAB   Respiratory Syncytial Virus A NOT DETECTED   Final   Respiratory Syncytial Virus B NOT DETECTED   Final   Influenza A NOT DETECTED   Final   Influenza B NOT DETECTED   Final   Parainfluenza 1 NOT DETECTED   Final   Parainfluenza 2 NOT DETECTED   Final   Parainfluenza 3 NOT DETECTED   Final   Metapneumovirus NOT DETECTED   Final   Rhinovirus NOT DETECTED   Final   Adenovirus NOT DETECTED   Final   Influenza A H1 NOT DETECTED   Final   Influenza A H3 NOT DETECTED   Final   Comment: (NOTE)           Normal Reference Range for each Analyte: NOT DETECTED     Testing performed using the Luminex xTAG Respiratory Viral Panel test     kit.     This test was developed and its performance characteristics determined     by [facility]. It has not been cleared or approved by the [place]     Food and Drug Administration. This test is used for clinical purposes.  It should not be regarded as investigational or for research. This     laboratory is certified under the [place] (CLIA) as qualified to perform high complexity     clinical laboratory testing.     Performed at [facility]   Studies/Results: Dg Chest 2 View  05/06/2014   CLINICAL DATA:  Cough, congestion  EXAM: CHEST  2 VIEW  COMPARISON:  DG CHEST 2 VIEW dated 05/04/2014; DG CHEST 2 VIEW dated 09/16/2013; DG CHEST 2 VIEW dated 05/02/2014  FINDINGS: Stable cardiomegaly. Re- demonstrated heterogeneous opacity left mid lung. No pleural effusion or pneumothorax. Regional skeleton is unremarkable. Right lung base appears clear.  IMPRESSION: Re- demonstrated left mid lung heterogeneous opacity which may represent pneumonia in the appropriate clinical setting. Continued radiographic follow-up until resolution is recommended.   Electronically Signed   By: Lovey Newcomer M.D.   On: 05/06/2014 12:45   Medications:  Scheduled Meds: . atenolol  100 mg Oral Daily  . baclofen  10 mg Oral TID  .  benazepril  40 mg Oral Daily  . buPROPion  150 mg Oral BID  . clindamycin  600 mg Oral 3 times per day  . diphenhydrAMINE  25 mg Oral 4 times per day  . enoxaparin (LOVENOX) injection  60 mg Subcutaneous Q24H  . fluticasone  2 spray Each Nare Daily  . furosemide  160 mg Oral Daily  . guaiFENesin  600 mg Oral TID  . insulin aspart  0-20 Units Subcutaneous TID WC  . insulin aspart  6 Units Subcutaneous TID WC  . insulin aspart protamine- aspart  135 Units Subcutaneous Q breakfast  . insulin aspart protamine- aspart  70 Units Subcutaneous Q supper  . ipratropium-albuterol  3 mL Nebulization TID  . pantoprazole  40 mg Oral QAC breakfast  . predniSONE  60 mg Oral Q breakfast  . pregabalin  200 mg Oral TID  . silver sulfADIAZINE   Topical Daily  . simvastatin  20 mg Oral q1800  . sodium chloride  3 mL Intravenous Q12H   Continuous Infusions:  PRN Meds:.albuterol, benzonatate, hydrALAZINE, oxyCODONE-acetaminophen, traZODone Assessment/Plan: 53 y.o with h/o med noncompliance, chronic medical conditions presented with cough and sob since Sunday prior to admission   #SOB likely 2/2 asthma vs aspiration -etiology could be 2/2 asthma exacerbation vs aspiration likely with ongoing tobacco abuse   -tx'ed with Z pack, Clindamycin (day 4/7) for aspiration coverage  -continue Duonebs, Supp O2, Mucinex, prn Albuterol  -po steroids will continue for total 10 days  -repeat CXR in 4-6 weeks for resolution  -f/u in clinic 05/16/14 will try to get moved up sooner   #Dysphagia  -will need outpatient f/u with GI   #Type II diabetes mellitus with peripheral circulatory disorders, uncontrolled  -monitor cbg on 70/30 140 qam and 80 qpm  -SSI R. Will d/c Novolog 6 units with meals   #HYPERTENSION -continue Atenolol  100 mg qd, Lotensin 40 mg qd, Lasix 160 mg qd   #Chronic diastolic heart failure -Continue Lasix 160 mg daily  -will give Lasix 80 mg x 1  -strict i/o, daily weights   #Tobacco  abuse -encouraged smoking cessation   #chronic pain syndrome -Continue Baclofen, Lyrica, Percocet   #Wound to left BKA site  -will have pt follow up with Dr.Duda outpatient -Wound consulted this admission   #F/E/N -NSL -carb mod   #DVT px  -Lov   Dispo: D/c home tomorrow  The patient does have a current PCP Bartholomew Crews, MD) and does need an [facility] hospital follow-up appointment after discharge.  The patient does not have transportation limitations that hinder transportation to clinic appointments.  .Services Needed at time of discharge: Y = Yes, Blank = No PT: H/H  OT: H/H  RN:   Equipment: Tub bench  Other:     LOS: 5 days   Cresenciano Genre, MD [phone] 05/07/2014, 8:18 AM

## 2014-05-07 NOTE — Progress Notes (Signed)
    Day 5 of stay      Patient name: Jennifer [name]  Medical record number: [id number]  Date of birth: 07-12-1961   Patient feels slightly better with breathing, however is short of breath with minor exertion.  On exam her lungs are clear to auscultation, and she has mild pedal edema with left BKA. Her PEFR hovers around 200s. We are continuing steroids and clindamycin po for aspiration. Net output since admission has been >4 litres. At this time, I will increase her lasix and give her one extra dose of 80 mg po on top of her 160 mg daily. We will reassess in the morning. Likely discharge tomorrow. The patient has appointment with Dr [name] coming up on 5/26.   I have discussed the care of this patient with my IM team residents. Please see the resident note for details.  Jennifer [name] 05/07/2014, 2:25 PM.

## 2014-05-07 NOTE — Discharge Instructions (Addendum)
Please keep your follow-up appointments; this is very important for your continued recovery.    Please continue to take all of your medications as prescribed.  Do not miss any doses without contacting your primary physician.  If you have questions, please contact your physician.    Please bring your medicications with you to your appointments; medicications may be eye drops, herbals, vitamins, or pills.    If you believe you are having a life-threatening emergency, go to the nearest [facility] Department.

## 2014-05-08 LAB — BASIC METABOLIC PANEL
BUN: 33 mg/dL — ABNORMAL HIGH (ref 6–23)
CO2: 33 mEq/L — ABNORMAL HIGH (ref 19–32)
Calcium: 9 mg/dL (ref 8.4–10.5)
Chloride: 98 mEq/L (ref 96–112)
Creatinine, Ser: 0.91 mg/dL (ref 0.50–1.10)
GFR calc Af Amer: 81 mL/min — ABNORMAL LOW (ref >90)
GFR calc non Af Amer: 70 mL/min — ABNORMAL LOW (ref >90)
Glucose, Bld: 138 mg/dL — ABNORMAL HIGH (ref 70–99)
Potassium: 3.6 mEq/L — ABNORMAL LOW (ref 3.7–5.3)
Sodium: 141 mEq/L (ref 137–147)

## 2014-05-08 LAB — GLUCOSE, CAPILLARY
Glucose-Capillary: 86 mg/dL (ref 70–99)
Glucose-Capillary: 162 mg/dL — ABNORMAL HIGH (ref 70–99)

## 2014-05-08 MED ORDER — INSULIN LISPRO PROT & LISPRO (75-25 MIX) 100 UNIT/ML KWIKPEN: 70.0000 [IU] | PEN_INJECTOR | Freq: Two times a day (BID) | SUBCUTANEOUS | Status: DC

## 2014-05-08 MED ORDER — POTASSIUM CHLORIDE CRYS ER 20 MEQ PO TBCR
40.0000 meq | EXTENDED_RELEASE_TABLET | Freq: Once | ORAL | Status: AC
  Administered 2014-05-08: 40 meq via ORAL
  Filled 2014-05-08: qty 2

## 2014-05-08 NOTE — Progress Notes (Signed)
Subjective:   VSS.  Pt saturating 100% on 3L [place]. Pt down 6.1L since admission; weight's inconsistent.  She reports her breathing is better this AM.    Objective:   Vital signs in last 24 hours: Filed Vitals:   05/07/14 2158 05/08/14 0701 05/08/14 0847 05/08/14 1109  BP: 148/62 168/67  175/86  Pulse: 61 62  66  Temp: 98.2 F (36.8 C) 98.1 F (36.7 C)    TempSrc: Oral Oral    Resp: 18 20    Height:      Weight:  266 lb 5.1 oz (120.8 kg)    SpO2: 95% 100% 91% 99%    Weight: Filed Weights   05/06/14 0521 05/07/14 0514 05/08/14 0701  Weight: 269 lb 8 oz (122.244 kg) 267 lb 10.2 oz (121.4 kg) 266 lb 5.1 oz (120.8 kg)    Ins/Outs:  Intake/Output Summary (Last 24 hours) at 05/08/14 1224 Last data filed at 05/08/14 0844  Gross per 24 hour  Intake   1080 ml  Output   2428 ml  Net  -1348 ml    Physical Exam: Constitutional: Vital signs reviewed.  Patient is sitting up in bed in no acute distress and cooperative with exam.   HEENT: [place]/AT; PERRL, EOMI, conjunctivae normal/pale, no scleral icterus  Cardiovascular: RRR, no MRG Pulmonary/Chest: normal respiratory effort, no accessory muscle use, minimal diffuse wheezing  Abdominal: Soft. +BS, NT/ND Neurological: A&O x3, CN II_XII grossly intact; non-focal exam Extremities: 2+DP right, left BKA Skin: Warm, dry and intact. No rash  Lab Results:  BMP:  Recent Labs Lab 05/05/14 0431 05/08/14 0433  NA 138 141  K 4.2 3.6*  CL 98 98  CO2 29 33*  GLUCOSE 274* 138*  BUN 20 33*  CREATININE 0.73 0.91  CALCIUM 9.4 9.0   Anion Gap:  10  CBC:  Recent Labs Lab 05/02/14 1444  WBC 6.7  HGB 11.2*  HCT 35.1*  MCV 91.2  PLT 140*   CBG:            Recent Labs Lab 05/07/14 0709 05/07/14 1223 05/07/14 1646 05/07/14 2210 05/08/14 0707 05/08/14 1117  GLUCAP 112* 132* 237* 177* 86 162*           HA1C:       Recent Labs Lab 05/03/14 0820  HGBA1C 7.4*   Lactic Acid/Procalcitonin:  Recent Labs Lab  05/03/14 0820  PROCALCITON <0.10   EKG: EKG Interpretation  Date/Time:  Tuesday May 02 2014 14:03:40 EDT Ventricular Rate:  79 PR Interval:  150 QRS Duration: 70 QT Interval:  396 QTC Calculation: 203 R Axis:   35 Text Interpretation:  Normal sinus rhythm Anterior infarct , age undetermined Abnormal ECG Confirmed by Alvino Chapel  MD, NATHAN [phone]) on 05/02/2014 3:31:25 PM   BNP:  Recent Labs Lab 05/02/14 1444  PROBNP 495.6*    Micro Results: Recent Results (from the past 240 hour(s))  RESPIRATORY VIRUS PANEL     Status: None   Collection Time    05/02/14  9:06 PM      Result Value Ref Range Status   Source - RVPAN NASAL SWAB   Corrected   Comment: CORRECTED ON 05/13 AT 1638: PREVIOUSLY REPORTED AS NASAL SWAB   Respiratory Syncytial Virus A NOT DETECTED   Final   Respiratory Syncytial Virus B NOT DETECTED   Final   Influenza A NOT DETECTED   Final   Influenza B NOT DETECTED   Final   Parainfluenza 1 NOT DETECTED  Final   Parainfluenza 2 NOT DETECTED   Final   Parainfluenza 3 NOT DETECTED   Final   Metapneumovirus NOT DETECTED   Final   Rhinovirus NOT DETECTED   Final   Adenovirus NOT DETECTED   Final   Influenza A H1 NOT DETECTED   Final   Influenza A H3 NOT DETECTED   Final   Comment: (NOTE)           Normal Reference Range for each Analyte: NOT DETECTED     Testing performed using the Luminex xTAG Respiratory Viral Panel test     kit.     This test was developed and its performance characteristics determined     by [facility]. It has not been cleared or approved by the [place]     Food and Drug Administration. This test is used for clinical purposes.     It should not be regarded as investigational or for research. This     laboratory is certified under the [place] (CLIA) as qualified to perform high complexity     clinical laboratory testing.     Performed at [facility]    Blood Culture:      Component Value Date/Time   SDES URINE, CLEAN CATCH 07/07/2013 0321   SPECREQUEST NONE 07/07/2013 0321   CULT Multiple bacterial morphotypes present, none predominant. Suggest appropriate recollection if clinically indicated. 07/07/2013 0321   REPTSTATUS 07/08/2013 FINAL 07/07/2013 0321    Studies/Results: No results found.  Medications:  Scheduled Meds: . atenolol  100 mg Oral Daily  . baclofen  10 mg Oral TID  . benazepril  40 mg Oral Daily  . buPROPion  150 mg Oral BID  . clindamycin  600 mg Oral 3 times per day  . diphenhydrAMINE  25 mg Oral 4 times per day  . enoxaparin (LOVENOX) injection  60 mg Subcutaneous Q24H  . fluticasone  2 spray Each Nare Daily  . furosemide  160 mg Oral Daily  . guaiFENesin  600 mg Oral TID  . insulin aspart  0-20 Units Subcutaneous TID WC  . insulin aspart protamine- aspart  140 Units Subcutaneous Q breakfast  . insulin aspart protamine- aspart  80 Units Subcutaneous Q supper  . ipratropium-albuterol  3 mL Nebulization TID  . pantoprazole  40 mg Oral QAC breakfast  . predniSONE  60 mg Oral Q breakfast  . pregabalin  200 mg Oral TID  . silver sulfADIAZINE   Topical Daily  . simvastatin  20 mg Oral q1800  . sodium chloride  3 mL Intravenous Q12H   Continuous Infusions:  PRN Meds: albuterol, benzonatate, hydrALAZINE, oxyCODONE-acetaminophen, traZODone  Antibiotics: Antibiotics Given (last 72 hours)   Date/Time Action Medication Dose   05/05/14 1344 Given   clindamycin (CLEOCIN) capsule 600 mg 600 mg   05/05/14 2200 Given   clindamycin (CLEOCIN) capsule 600 mg 600 mg   05/06/14 0741 Given   clindamycin (CLEOCIN) capsule 600 mg 600 mg   05/06/14 1011 Given   azithromycin (ZITHROMAX) tablet 250 mg 250 mg   05/06/14 1627 Given   clindamycin (CLEOCIN) capsule 600 mg 600 mg   05/06/14 2257 Given   clindamycin (CLEOCIN) capsule 600 mg 600 mg   05/07/14 0715 Given   clindamycin (CLEOCIN) capsule 600 mg 600 mg   05/07/14 1348 Given    clindamycin (CLEOCIN) capsule 600 mg 600 mg   05/07/14 2256 Given   clindamycin (CLEOCIN) capsule 600  mg 600 mg   05/08/14 0645 Given   clindamycin (CLEOCIN) capsule 600 mg 600 mg   05/08/14 1100 Given   clindamycin (CLEOCIN) capsule 600 mg 600 mg      Day of Hospitalization: 6  Consults:    Assessment/Plan:   Principal Problem:   SOB (shortness of breath) Active Problems:   Type II diabetes mellitus with peripheral circulatory disorders, uncontrolled (250.72)   HYPERTENSION   Chronic pain syndrome   Chronic diastolic heart failure   Tobacco abuse   Noncompliance with medications   Hyperglycemia  Acute respiratory distress Pt reports breathing has improved since admission.  She is down 6.1L since admission.  Pt admitted 5/12 with shortness of breath, cough, wheezing, and decreased po intake, using albuterol inhaler more frequently.  Being treated for possible aspiration PNA with clindamycin x 6 days. Pt with history of COPD/asthma and diastolic dysfunction.  She is also receiving lasix 180m qd and prednisone 655mdaily. Etiology likely acute CHF.  PCT normal, BNP elevated but likely more elevated than reported in the setting of obesity.    -continue lasix 16047md -decrease prednisone to 65m51m  -duonebs (scheduled) and aluterol PRN -tessalon 100mg58m PRN  Probable acute on chronic diastolic HF Pt reports shortness of breath has improved.  09/17/13 TTE: LVEF 65-70% with grade 1 diastolic dysfunction.  -per above  HTN Stable. -continue benazepril 65mg 71mhydralazine PRN   DMII Stable. 05/03/14 HA1C: 7.4.  Recent Labs  05/07/14 2210 05/08/14 0707 05/08/14 1117  GLUCAP 177* 86 162*  -monitor cbgs -continue SSI-resistant, Novolog 70/30: 140 units in AM, and 80 units in PM  GERD -continue protonix 65mg q60mDyslipidemia -continue zocor  Depression  -continue wellbutrin SR, trazadone   VTE PPx  lovenox 60mg qd68msposition Disposition is deferred,  awaiting improvement of current medical problems.  Anticipated discharge in approximately 1-2 day(s).     LOS: 6 days   JacquelyJones Bales-1, Internal Medicine Teaching Service [phone] Mon-Fri) 05/08/2014, 12:24 PM

## 2014-05-08 NOTE — Discharge Summary (Signed)
Name: Jennifer [name] MRN: [id number] DOB: 1961/05/29 53 y.o. PCP: Bartholomew Crews, MD _________________________________________________   Date of Admission: 05/02/2014  2:30 PM Date of Discharge: 05/08/2014 Attending Physician: Madilyn Fireman, MD   Discharge Diagnosis: Acute respiratory distress Probable acute on chronic diastolic heart failure  Obstructive lung disease Hypertension DM II GERD Dyslipidemia Depression    Discharge Medications:   Medication List         albuterol (5 MG/ML) 0.5% nebulizer solution  Commonly known as:  PROVENTIL  Take 2.5 mg by nebulization every 6 (six) hours as needed for wheezing or shortness of breath.     albuterol 108 (90 BASE) MCG/ACT inhaler  Commonly known as:  PROVENTIL HFA;VENTOLIN HFA  Inhale 2 puffs into the lungs every 6 (six) hours as needed for wheezing or shortness of breath.     atenolol 100 MG tablet  Commonly known as:  TENORMIN  Take 100 mg by mouth daily.     baclofen 10 MG tablet  Commonly known as:  LIORESAL  Take 10 mg by mouth 3 (three) times daily.     benazepril 40 MG tablet  Commonly known as:  LOTENSIN  Take 40 mg by mouth daily.     benzonatate 100 MG capsule  Commonly known as:  TESSALON  Take 1 capsule (100 mg total) by mouth 3 (three) times daily as needed for cough.     buPROPion 150 MG 12 hr tablet  Commonly known as:  WELLBUTRIN SR  Take 150 mg by mouth 2 (two) times daily.     clindamycin 300 MG capsule  Commonly known as:  CLEOCIN  Take 2 capsules (600 mg total) by mouth every 8 (eight) hours.     diphenhydrAMINE 25 MG tablet  Commonly known as:  BENADRYL  Take 25 mg by mouth every 6 (six) hours.     esomeprazole 20 MG capsule  Commonly known as:  NEXIUM  Take 40 mg by mouth daily.     fluticasone 50 MCG/ACT nasal spray  Commonly known as:  FLONASE  Place 2 sprays into both nostrils daily.     Fluticasone-Salmeterol 250-50 MCG/DOSE Aepb  Commonly known as:  ADVAIR    Inhale 1 puff into the lungs 2 (two) times daily.     furosemide 80 MG tablet  Commonly known as:  LASIX  Take 160 mg by mouth daily.     guaiFENesin 600 MG 12 hr tablet  Commonly known as:  MUCINEX  Take 1 tablet (600 mg total) by mouth 3 (three) times daily.     Insulin Lispro Prot & Lispro (75-25) 100 UNIT/ML Kwikpen  Commonly known as:  HUMALOG MIX 75/25 KWIKPEN  Inject 70-135 Units into the skin 2 (two) times daily. Inject 135 units in the morning before breakfast.  Inject 70 units in the evening before evening meal.     ipratropium 0.02 % nebulizer solution  Commonly known as:  ATROVENT  Take 0.5 mg by nebulization every 6 (six) hours as needed for wheezing or shortness of breath.     metFORMIN 1000 MG tablet  Commonly known as:  GLUCOPHAGE  Take 1,000 mg by mouth 2 (two) times daily with a meal.     mupirocin cream 2 %  Commonly known as:  BACTROBAN  Apply 1 application topically 2 (two) times daily.     oxyCODONE-acetaminophen 5-325 MG per tablet  Commonly known as:  PERCOCET/ROXICET  Take 2 tablets by mouth every 4 (four) hours as  needed for moderate pain or severe pain.     potassium chloride SA 20 MEQ tablet  Commonly known as:  KLOR-CON M20  Take 2 tablets (40 mEq total) by mouth daily.     pravastatin 40 MG tablet  Commonly known as:  PRAVACHOL  Take 40 mg by mouth daily.     predniSONE 20 MG tablet  Commonly known as:  DELTASONE  Take 2 tablets (40 mg total) by mouth daily with breakfast.     pregabalin 200 MG capsule  Commonly known as:  LYRICA  Take 200 mg by mouth 3 (three) times daily.     travoprost (benzalkonium) 0.004 % ophthalmic solution  Commonly known as:  TRAVATAN  Place 1 drop into both eyes at bedtime.     traZODone 100 MG tablet  Commonly known as:  DESYREL  Take 100 mg by mouth at bedtime as needed for sleep.        Disposition and follow-up:   Jennifer [name] was discharged from [facility] in stable  condition to home.    Please address the following problems post discharge:  1. Compliance with medications (especially lasix) 2. Improvement in breathing, respiratory symptoms 3. Smoking cessation   Labs / imaging needed at time of follow-up: BMP  Pending labs/ test needing follow-up: CXR in 6 weeks.   Follow-up Appointments: Follow-up Information   Follow up with [facility]. (Resume Registered Nurse and Physical Therapy services to start within 24-48 hours of discharge)    Contact information:   [address] [place] [address] [phone]       Follow up with Larey Dresser, MD On 05/16/2014. (10:45AM)    Specialty:  Internal Medicine   Contact information:   [place] [address] [phone]       Discharge Instructions: Discharge Instructions   ([place]) Call MD:  Anytime you have any of the following symptoms: 1) 3 pound weight gain in 24 hours or 5 pounds in 1 week 2) shortness of breath, with or without a dry hacking cough 3) swelling in the hands, feet or stomach 4) if you have to sleep on extra pillows at night in order to breathe.    Complete by:  As directed      Call MD for:  extreme fatigue    Complete by:  As directed      Call MD for:  persistant dizziness or light-headedness    Complete by:  As directed      Diet - low sodium heart healthy    Complete by:  As directed      Discharge instructions    Complete by:  As directed   Please take Clindamycin for 3 more days and Prednisone for 6 more days starting tomorrow   Follow up next week in clinic   Pick up medications from pharmacy     Increase activity slowly    Complete by:  As directed      Increase activity slowly    Complete by:  As directed            Consultations:  None  Procedures Performed:  Dg Chest 2 View  05/06/2014   CLINICAL DATA:  Cough, congestion  EXAM: CHEST  2 VIEW  COMPARISON:  DG CHEST 2 VIEW dated  05/04/2014; DG CHEST 2 VIEW dated 09/16/2013; DG CHEST 2 VIEW dated 05/02/2014  FINDINGS: Stable cardiomegaly. Re- demonstrated heterogeneous opacity left mid lung. No  pleural effusion or pneumothorax. Regional skeleton is unremarkable. Right lung base appears clear.  IMPRESSION: Re- demonstrated left mid lung heterogeneous opacity which may represent pneumonia in the appropriate clinical setting. Continued radiographic follow-up until resolution is recommended.   Electronically Signed   By: Lovey Newcomer M.D.   On: 05/06/2014 12:45   Dg Chest 2 View  05/04/2014   CLINICAL DATA:  Follow-up right base opacity  EXAM: CHEST  2 VIEW  COMPARISON:  05/02/2014  FINDINGS: Cardiomegaly scattered cardiomegaly again noted. Study is limited by patient's large body habitus. Persistent streaky right basilar atelectasis or infiltrate. No pulmonary edema. Mild streaky atelectasis left midlung. Central vascular congestion. Bony thorax is stable.  IMPRESSION: Persistent streaky right basilar atelectasis or infiltrate. Central vascular congestion without pulmonary edema.   Electronically Signed   By: Lahoma Crocker M.D.   On: 05/04/2014 09:40   Dg Chest 2 View  05/02/2014   CLINICAL DATA:  Cough and shortness of breath for 3 days with history of asthma hypertension diabetes COPD and CHF as well as smoking history  EXAM: CHEST  2 VIEW  COMPARISON:  NM MYOCAR MULTI w/SPECT w/WALL MOTION / EF dated 09/19/2013; DG CHEST 2 VIEW dated 09/16/2013  FINDINGS: There is mild cardiac enlargement. There is vascular congestion. The central pulmonary arteries are prominent. There is hazy perihilar opacity on the right. Left lung appears clear. No pleural effusions. There is bilateral perihilar peribronchial wall thickening.  IMPRESSION: 1. Cardiac enlargement with vascular congestion. Mild hazy right perihilar opacity noted, possibly representing mild asymmetric pulmonary edema. Developing pneumonia not excluded. 2. Prominent central pulmonary arteries  suggesting the possibility of pulmonary arterial hypertension.   Electronically Signed   By: Skipper Cliche M.D.   On: 05/02/2014 15:31    2D Echo: N/A  Cardiac Cath: N/A  Admission HPI:  Jennifer [name] is a 53 year old female with past medical history of insulin-dependent Type II DM, HTN, HL, asthma, grade 1 diastolic CHF (on 2D-echo [id number]), PVD s/p left BKA, depression, chronic pain, and tobacco abuse who presents with shortness of breath of two duration.  Pt reports that two days on Sunday she began having acute onset of shortness of breath with cough (clear sputum production), wheezing, fever, chills, nasal congestion, body aches, and decreased PO intake. She was having to use her albuterol inhaler every 4-6 hr and usually uses it only as needed. She has been compliant with advair daily. Her last asthma exacerbation was a few years ago (per records 06/2013) with no recent antibiotics/steroid use or history of intubations. She continues to smoke 0.5 pack days for past 45 years and does not wish to quit at this time.  Pt reports she has not taken her lasix 160 mg daily for the past 3 days since it makes her urinate too much. She has noticed increased right LE swelling. She reports no weight gain but does not check her weight at home. She has had no recent dietary indiscretion.   Juluis Mire, MD  Hospital Course by problem list: Principal Problem:   SOB (shortness of breath) Active Problems:   Type II diabetes mellitus with peripheral circulatory disorders, uncontrolled (250.72)   HYPERTENSION   Chronic pain syndrome   Chronic diastolic heart failure   Tobacco abuse   Noncompliance with medications   Hyperglycemia   Acute respiratory distress  Pt admitted 5/12 with shortness of breath, cough, wheezing, and decreased po intake, using albuterol inhaler more frequently.  Pt with history of COPD/asthma and  diastolic dysfunction. Etiology likely acute CHF. PCT normal, BNP elevated but  likely more elevated than reported in the setting of obesity. CXR finding of possible developing PNA and pulmonary edema in setting of diuretic noncompliance.  Pt was afebrile without leukocytosis.  She was given duonebs, albuterol, and prednisone 40mg  x 5 days.  Also treated for possible aspiration PNA with clindamycin x 6 days.  We continued her home lasix of 160mg  daily.  Respiratory virus panel was negative.  Procalcitonin was <0.10.  Troponin in ED was wnl.  She had good urine output and was down 6.1L since admission. She was discharged home on 5/18 after significant improvement in her breathing and in stable condition to follow up with her PCP.   Probable acute on chronic diastolic HF  Pt reports shortness of breath improved upon discharge.  09/17/13 TTE: LVEF 65-70% with grade 1 diastolic dysfunction.  Management per above.   HTN  Stable.  Continued benazepril 40mg  qd.  Hydralazine PRN.   DMII  Stable. 05/03/14 HA1C: 7.4.  Monitored cbgs and placed on SSI. Recent Labs   05/07/14 2210  05/08/14 0707  05/08/14 1117   GLUCAP  177*  86  162*    GERD  Continued protonix.  Dyslipidemia  Continued zocor.   Depression  Continued wellbutrin SR, trazadone.    Discharge Vitals:   BP 175/86  Pulse 66  Temp(Src) 98.1 F (36.7 C) (Oral)  Resp 20  Ht 5\' 7"  (1.702 m)  Wt 266 lb 5.1 oz (120.8 kg)  BMI 41.70 kg/m2  SpO2 99%  Discharge Labs:  Results for orders placed during the hospital encounter of 05/02/14 (from the past 24 hour(s))  GLUCOSE, CAPILLARY     Status: Abnormal   Collection Time    05/07/14  4:46 PM      Result Value Ref Range   Glucose-Capillary 237 (*) 70 - 99 mg/dL   Comment 1 Documented in Chart     Comment 2 Notify RN    GLUCOSE, CAPILLARY     Status: Abnormal   Collection Time    05/07/14 10:10 PM      Result Value Ref Range   Glucose-Capillary 177 (*) 70 - 99 mg/dL  BASIC METABOLIC PANEL     Status: Abnormal   Collection Time    05/08/14  4:33 AM       Result Value Ref Range   Sodium 141  137 - 147 mEq/L   Potassium 3.6 (*) 3.7 - 5.3 mEq/L   Chloride 98  96 - 112 mEq/L   CO2 33 (*) 19 - 32 mEq/L   Glucose, Bld 138 (*) 70 - 99 mg/dL   BUN 33 (*) 6 - 23 mg/dL   Creatinine, Ser 0.91  0.50 - 1.10 mg/dL   Calcium 9.0  8.4 - 10.5 mg/dL   GFR calc non Af Amer 70 (*) >90 mL/min   GFR calc Af Amer 81 (*) >90 mL/min  GLUCOSE, CAPILLARY     Status: None   Collection Time    05/08/14  7:07 AM      Result Value Ref Range   Glucose-Capillary 86  70 - 99 mg/dL  GLUCOSE, CAPILLARY     Status: Abnormal   Collection Time    05/08/14 11:17 AM      Result Value Ref Range   Glucose-Capillary 162 (*) 70 - 99 mg/dL   Comment 1 Notify RN      Signed: Jones Bales, MD [phone] 05/08/2014, 1:55 PM  Time Spent on Discharge: 78minutes Services Ordered on Discharge: None Equipment Ordered on Discharge: None

## 2014-05-08 NOTE — Progress Notes (Signed)
Discharge instructions and prescriptions given to pt . Verbalized understanding 

## 2014-05-08 NOTE — Progress Notes (Signed)
Subjective: Patient was feeling much better, although still coughing with traces of white sputum- much improved. She was comfortable on room air with O2 sat. Between 91-99%Leg measurements: .Right leg Pain and swelling has improved.States that she is still not at her usual stae of health but has improved a lot over the course of her hospitalization.  Objective: Vital signs in last 24 hours: Filed Vitals:   05/07/14 2158 05/08/14 0701 05/08/14 0847 05/08/14 1109  BP: 148/62 168/67  175/86  Pulse: 61 62  66  Temp: 98.2 F (36.8 C) 98.1 F (36.7 C)    TempSrc: Oral Oral    Resp: 18 20    Height:      Weight:  120.8 kg (266 lb 5.1 oz)    SpO2: 95% 100% 91% 99%   Weight change: -0.6 kg (-1 lb 5.2 oz)  Intake/Output Summary (Last 24 hours) at 05/08/14 1149 Last data filed at 05/08/14 0844  Gross per 24 hour  Intake   1080 ml  Output   2428 ml  Net  -1348 ml   BP 175/86  Pulse 66  Temp(Src) 98.1 F (36.7 C) (Oral)  Resp 20  Ht 5' 7"  (1.702 m)  Wt 120.8 kg (266 lb 5.1 oz)  BMI 41.70 kg/m2  SpO2 99%  General Appearance:    Alert, cooperative, no distress, appears stated age  Head:    Normocephalic, without obvious abnormality, atraumatic  Eyes:    PERRL, conjunctiva/corneas clear, EOM's intact,        Neck:   Supple, symmetrical, trachea midline, no adenopathy;    thyroid:  no enlargement/tenderness/nodules; no carotid   bruit or JVD  Lungs:     Few scattered wheeze esp. 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  Pulses:   2+ and symmetric all extremities  Skin:   Skin color, texture, turgor normal, no rashes or lesions     Neurologic:   CNII-XII intact, normal strength, sensation and reflexes    throughout     Lab Results: @LABTEST2 @ Micro Results: Recent Results (from the past 240 hour(s))  RESPIRATORY VIRUS PANEL     Status: None   Collection Time    05/02/14  9:06 PM      Result Value Ref Range Status   Source - [facility]AN NASAL SWAB   Corrected   Comment: CORRECTED ON 05/13 AT 1715: PREVIOUSLY REPORTED AS NASAL SWAB   Respiratory Syncytial Virus A NOT DETECTED   Final   Respiratory Syncytial Virus B NOT DETECTED   Final   Influenza A NOT DETECTED   Final   Influenza B NOT DETECTED   Final   Parainfluenza 1 NOT DETECTED   Final   Parainfluenza 2 NOT DETECTED   Final   Parainfluenza 3 NOT DETECTED   Final   Metapneumovirus NOT DETECTED   Final   Rhinovirus NOT DETECTED   Final   Adenovirus NOT DETECTED   Final   Influenza A H1 NOT DETECTED   Final   Influenza A H3 NOT DETECTED   Final   Comment: (NOTE)  Normal Reference Range for each Analyte: NOT DETECTED     Testing performed using the Luminex xTAG Respiratory Viral Panel test     kit.     This test was developed and its performance characteristics determined     by [facility]. It has not been cleared or approved by the [place]     Food and Drug Administration. This test is used for clinical purposes.     It should not be regarded as investigational or for research. This     laboratory is certified under the [place] (CLIA) as qualified to perform high complexity     clinical laboratory testing.     Performed at [facility]   Studies/Results: No results found. Medications: medication reviewed Scheduled Meds: . atenolol  100 mg Oral Daily  . baclofen  10 mg Oral TID  . benazepril  40 mg Oral Daily  . buPROPion  150 mg Oral BID  . clindamycin  600 mg Oral 3 times per day  . diphenhydrAMINE  25 mg Oral 4 times per day  . enoxaparin (LOVENOX) injection  60 mg Subcutaneous Q24H  . fluticasone  2 spray Each Nare Daily  . furosemide  160 mg Oral Daily  .  guaiFENesin  600 mg Oral TID  . insulin aspart  0-20 Units Subcutaneous TID WC  . insulin aspart protamine- aspart  140 Units Subcutaneous Q breakfast  . insulin aspart protamine- aspart  80 Units Subcutaneous Q supper  . ipratropium-albuterol  3 mL Nebulization TID  . pantoprazole  40 mg Oral QAC breakfast  . predniSONE  60 mg Oral Q breakfast  . pregabalin  200 mg Oral TID  . silver sulfADIAZINE   Topical Daily  . simvastatin  20 mg Oral q1800  . sodium chloride  3 mL Intravenous Q12H   Continuous Infusions:  PRN Meds:.albuterol, benzonatate, hydrALAZINE, oxyCODONE-acetaminophen, traZODone Assessment/Plan: SOB:. Most probably due to exacerbation of asthma with aspiration pneumonia as her RLL infiltrate persists in her current CXR. Patient in on oral prednisone and clindamycin, responding well.Continue clindamycin for 3 more days with total of 7 days.  RIGHT LEG PAIN : Right leg pain improved, Doppler -ev. Continue with lasix .  LEFT STUMP WOUND: Continue with silvadene dressing QD as advised by Dr. Sharol Given.  Mild acute on chronic grade 1 diastolic CHF: Continue with her home lasix of 160m QD .  DM:Her todays CBG was 138 and 87 this morning,Her DM looks under control as the infection resolved. May need to decrease her insulin dose for home to prevent hypoglycemia, as her prednisone will be stopped in next 4 days. Can be reassessed on follow up. HTN: Her last BP reading is 168/67. Continue with current meds.  HYPERLIPIDEMIA: Continue with current home meds.  MOLE on her back: Follow up with dermatology as OP.   Possible discharge today.     This is a MCareers information officerNote.  The care of the patient was discussed with Dr. MAundra Dubinand the assessment and plan formulated with their assistance.  Please see their attached note for official documentation of the daily encounter.   LOS: 6 days   SLorella Nimrod Med Student 05/08/2014, 11:49 AM

## 2014-05-08 NOTE — Discharge Summary (Signed)
HOSPITAL COURSE:  Acute exacerbation of Asthma in the setting of possible URI/PNA and pulmonary edema: Pt. Present with acute SOB with productive cough and wheezing. There was a possibility of aspiration pneumonia due to a suspicious infiltrate at her right lower lobe. Was given 2L of O2 for 5 days, IV prednisone now switched to oral for another 4 days. She is on clindamycin 600 mg since 5/14 for 7 days.Responded well on this treatment, 99% on room air with very minimum wheeze and cough. Mild acute on chronic Grade 1 Diastolic CHF: Had 3+ RLE edema on admission , as was not taking her home lasix for few days due to excessive urination. Home lasix of 160mg  QD restarted, does need some extra doses over the course of her admission. RLE edema improved to 1+ with no pain now. Continue with lasix 160mg  QD. Right leg pain with swelling: Doppler exclude the possibility of DVT. Improved with diuresis. Insulin- dependent Type II DM: Her DM was uncontrolled initially , may be due to her underline infection and prednisone.  Her insulin dose was increased during her stay, now well under controlled. Discharge her on lower dose of 135/70 to prevent any hypoglycemia. HTN: Her BP monitored through out her stay,remain within normal limit once her home medicine of atenolol 100mg  daily, benazepril 40mg  daily and furosemide 160 mg daily started.

## 2014-05-10 NOTE — Progress Notes (Signed)
I repeated the critical or key portions of the exam.  I confirmed/revised the medical student's history, exam, assessment and plan.

## 2014-05-16 ENCOUNTER — Encounter: Payer: Self-pay | Admitting: Dietician

## 2014-05-16 ENCOUNTER — Ambulatory Visit (INDEPENDENT_AMBULATORY_CARE_PROVIDER_SITE_OTHER): Payer: Medicare Other | Admitting: Internal Medicine

## 2014-05-16 ENCOUNTER — Ambulatory Visit (INDEPENDENT_AMBULATORY_CARE_PROVIDER_SITE_OTHER): Payer: Medicare Other | Admitting: Dietician

## 2014-05-16 ENCOUNTER — Encounter: Payer: Self-pay | Admitting: Internal Medicine

## 2014-05-16 DIAGNOSIS — S88119A Complete traumatic amputation at level between knee and ankle, unspecified lower leg, initial encounter: Secondary | ICD-10-CM

## 2014-05-16 DIAGNOSIS — J449 Chronic obstructive pulmonary disease, unspecified: Secondary | ICD-10-CM

## 2014-05-16 DIAGNOSIS — E1159 Type 2 diabetes mellitus with other circulatory complications: Secondary | ICD-10-CM

## 2014-05-16 DIAGNOSIS — I5032 Chronic diastolic (congestive) heart failure: Secondary | ICD-10-CM

## 2014-05-16 DIAGNOSIS — G894 Chronic pain syndrome: Secondary | ICD-10-CM

## 2014-05-16 DIAGNOSIS — F17200 Nicotine dependence, unspecified, uncomplicated: Secondary | ICD-10-CM

## 2014-05-16 DIAGNOSIS — I10 Essential (primary) hypertension: Secondary | ICD-10-CM

## 2014-05-16 DIAGNOSIS — Z720 Tobacco use: Secondary | ICD-10-CM

## 2014-05-16 LAB — BASIC METABOLIC PANEL WITH GFR
BUN: 27 mg/dL — ABNORMAL HIGH (ref 6–23)
CO2: 35 mEq/L — ABNORMAL HIGH (ref 19–32)
Calcium: 9.3 mg/dL (ref 8.4–10.5)
Chloride: 100 mEq/L (ref 96–112)
Creat: 0.85 mg/dL (ref 0.50–1.10)
GFR, Est African American: 89 mL/min
GFR, Est Non African American: 77 mL/min
Glucose, Bld: 156 mg/dL — ABNORMAL HIGH (ref 70–99)
Potassium: 3.8 mEq/L (ref 3.5–5.3)
Sodium: 143 mEq/L (ref 135–145)

## 2014-05-16 LAB — GLUCOSE, CAPILLARY: Glucose-Capillary: 166 mg/dL — ABNORMAL HIGH (ref 70–99)

## 2014-05-16 LAB — MAGNESIUM: Magnesium: 1.8 mg/dL (ref 1.5–2.5)

## 2014-05-16 MED ORDER — OXYCODONE-ACETAMINOPHEN 5-325 MG PO TABS: 2.0000 | ORAL_TABLET | ORAL | Status: DC | PRN

## 2014-05-16 NOTE — Progress Notes (Signed)
Medical Nutrition Therapy:  Appt start time: 1130 end time:  1200.  Assessment:  Primary concerns today: Weight management.  Patient concerned about weight gain despite cutting back on soda intake by about 5 2 liter bottles a week. She is contemplating bariatric surgery.Her charts shows weight has increased ~ 20# in 5 years and her A1C has simultaneously improved. Son helps her with shopping and cooking. She continue to work on goal of quitting smoking. Challenges to achieving weight loss include balancing insulin doses, desire for well controlled blood sugars and food intake.  24-hr recall of meal and insulin times:  (Up at  8-12  PM) B ( 8-12 AM)-       L & Snk (no certain time)- coke and potato chips D (8-12 AM)-      Recent physical activity includes very limited -wheel chair. Bound.   Progress Towards Goal(s):  In progress.   Nutritional Diagnosis: Pulled former dx from previous visits to be addressed:  NB-3.2 Limited access to food or water in 2014 As related to lack of resources to purchase foods to be assessed at next visit.  NB-1.3 Not ready for diet/lifestyle change as related to competing values (quitting smoking) as evidenced by patient indicating that if she could shop for herself she may not be doing as well  Cutting back on favorite foods she has a hard time limiting .  NB-1.1 Food and nutrition-related knowledge deficit improving as patient better able to verbalize foods that raise blood sugar as evidenced by patient report. NB-1.4 Self-monitoring deficit As related to not checking blood sugar at least twice a day Improving As evidenced by meter download with some but not daily blood sugar checks. 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     Intervention:  Nutrition assessment, support and plan for follow up. Provided food journal per patient request.  Coordination of care- suggest safer (reports hypoglycemia  unawareness and has history of chronically inadequate self monitoring)  diabetes regimen that allows patient more autonomy in self managing dietary excursions.   Monitoring/Evaluation:  Dietary intake, exercise, and body weight in 3 week(s).

## 2014-05-16 NOTE — Patient Instructions (Signed)
I gave you 2 paper prescriptions for your pain med. Please see me in 2 months I will mail you your lab results. Continue taking you lasix every day! Good work on cutting down on smoking! Go to the weight loss surgery class.

## 2014-05-16 NOTE — Assessment & Plan Note (Signed)
She is down from 2 PPD to 1/2 PPD and is trying. She did not smell like tobacco today.

## 2014-05-16 NOTE — Assessment & Plan Note (Signed)
I printed off 2 percocet Rx and asked that she return in 2 months. The first Rx will not be due until June 5th - last filled 4/30 but was in hospital 6 days. No signs of over sedation today.

## 2014-05-16 NOTE — Progress Notes (Signed)
   Subjective:    [name], female    DOB: 12/19/1961, 53 y.o.   MRN: [id number]  HPI  Please see the A&P for the status of the pt's chronic medical problems.   Review of Systems  Constitutional: Negative for unexpected weight change.  HENT: Positive for rhinorrhea and sneezing.   Eyes: Negative for itching.  Respiratory: Negative for shortness of breath.   Cardiovascular: Positive for leg swelling. Negative for chest pain.  Gastrointestinal: Negative for diarrhea and constipation.  Endocrine: Positive for polyuria.  Genitourinary: Positive for frequency. Negative for difficulty urinating.       Nocturia 2-3 times nightly  Musculoskeletal: Positive for back pain, gait problem and myalgias.  Skin: Positive for wound.  Neurological: Negative for dizziness, light-headedness and headaches.  Psychiatric/Behavioral: Negative for sleep disturbance.       Objective:   Physical Exam  Constitutional: She is oriented to person, place, and time. She appears well-developed and well-nourished. No distress.  HENT:  Head: Normocephalic and atraumatic.  Right Ear: External ear normal.  Left Ear: External ear normal.  Nose: Nose normal.  Eyes: Conjunctivae are normal.  Cardiovascular: Normal rate, regular rhythm and normal heart sounds.   Pulmonary/Chest: Effort normal and breath sounds normal. No respiratory distress.  Musculoskeletal: She exhibits edema. She exhibits no tenderness.  Neurological: She is alert and oriented to person, place, and time.  Skin: She is not diaphoretic.  CVI changes RLE  Psychiatric: She has a normal mood and affect. Her behavior is normal. Judgment and thought content normal.          Assessment & Plan:

## 2014-05-16 NOTE — Assessment & Plan Note (Signed)
States is compliant with her lasix 160 QAM and understands need to take it. Makes her have increased freq and vol for up to 12 hours after taking it. Check electrolytes this AM.

## 2014-05-16 NOTE — Assessment & Plan Note (Signed)
BP Readings from Last 3 Encounters:  05/16/14 174/80  05/08/14 175/86  04/20/14 152/80    High today but I forgot to repeat. Will recheck next appt and adjust if needed.

## 2014-05-16 NOTE — Assessment & Plan Note (Signed)
Still seeing Dr Sharol Given. Wound on stump healing. Plans to work with orthosis once wound healed.

## 2014-05-16 NOTE — Assessment & Plan Note (Signed)
Off ABX and steroids and breathing at baseline. Pul exam good today.

## 2014-05-16 NOTE — Discharge Summary (Signed)
Reviewed. Agree with documentation. 

## 2014-05-16 NOTE — Assessment & Plan Note (Signed)
Knows to call optho to get eye appt. Moderate control. No meter today. A1C borderline acceptable. F/U 2 months.  Lab Results  Component Value Date   HGBA1C 7.4* 05/03/2014

## 2014-05-16 NOTE — Assessment & Plan Note (Signed)
Wt Readings from Last 3 Encounters:  05/16/14 272 lb 12.8 oz (123.741 kg)  05/08/14 266 lb 5.1 oz (120.8 kg)  04/20/14 278 lb (126.1 kg)   Asked about bariatric weight loss surgery. Mamie gave her the info. I stressed need for complete tobacco cessation prior to elective surgery and motivation to make lifestyle changes. She does seem to be in a more positive mood and making positive steps toward improving her life.

## 2014-05-17 ENCOUNTER — Encounter: Payer: Self-pay | Admitting: Internal Medicine

## 2014-05-18 ENCOUNTER — Other Ambulatory Visit: Payer: Self-pay | Admitting: *Deleted

## 2014-05-19 MED ORDER — GLUCOSE BLOOD VI STRP: ORAL_STRIP | Status: DC

## 2014-05-23 ENCOUNTER — Telehealth: Payer: Self-pay | Admitting: *Deleted

## 2014-05-23 ENCOUNTER — Encounter: Payer: Self-pay | Admitting: Internal Medicine

## 2014-05-23 NOTE — Telephone Encounter (Signed)
Called [facility] and gave verbal ok for early refill per Dr [name]. [facility] stated she thinks there's a "Stop" preventing the early refill but she might be able to override it. Pt called/informed -no answer, message left.

## 2014-05-23 NOTE — Telephone Encounter (Signed)
Last refill was 4/30 but she had 7 day hospitalization from 5/12 - 18 when she was not using her own opioid supply. 6/5 was being generous with the fill date. I will authorize a one time early refill but she needs to be careful of her usage bc if she is out that means she is using more than we agreed on.

## 2014-05-23 NOTE — Telephone Encounter (Signed)
Call from pt - states she out of Oxycodone but the rx at the pharmacy states it cannot be filled until 6/5. I called [facility] - last rx was refilled on 4/30 and picked up on 5/1; qty #360. Pt wants to know if anything can be done to get rx refill now instead on the 5th. Thanks

## 2014-05-26 NOTE — Discharge Summary (Signed)
Reviewed the medical student note. The actual discharge summary by Dr Gordy Levan has been signed by me (earlier note). Please refer to that summary instead of this one.

## 2014-06-13 ENCOUNTER — Other Ambulatory Visit: Payer: Self-pay | Admitting: Internal Medicine

## 2014-06-13 NOTE — Telephone Encounter (Signed)
Lyrica rx called to [place].

## 2014-06-15 ENCOUNTER — Ambulatory Visit: Payer: Medicare Other | Admitting: Internal Medicine

## 2014-06-15 ENCOUNTER — Encounter: Payer: Medicare Other | Admitting: Dietician

## 2014-06-19 ENCOUNTER — Ambulatory Visit (INDEPENDENT_AMBULATORY_CARE_PROVIDER_SITE_OTHER): Payer: Medicare Other | Admitting: Internal Medicine

## 2014-06-19 ENCOUNTER — Ambulatory Visit (INDEPENDENT_AMBULATORY_CARE_PROVIDER_SITE_OTHER): Payer: Medicare Other | Admitting: Dietician

## 2014-06-19 ENCOUNTER — Telehealth: Payer: Self-pay | Admitting: *Deleted

## 2014-06-19 ENCOUNTER — Encounter: Payer: Self-pay | Admitting: Internal Medicine

## 2014-06-19 ENCOUNTER — Other Ambulatory Visit: Payer: Self-pay | Admitting: Internal Medicine

## 2014-06-19 VITALS — BP 191/84 | HR 65 | Temp 96.5°F | Ht 67.0 in | Wt 282.8 lb

## 2014-06-19 DIAGNOSIS — E1159 Type 2 diabetes mellitus with other circulatory complications: Secondary | ICD-10-CM

## 2014-06-19 DIAGNOSIS — I10 Essential (primary) hypertension: Secondary | ICD-10-CM

## 2014-06-19 DIAGNOSIS — L989 Disorder of the skin and subcutaneous tissue, unspecified: Secondary | ICD-10-CM

## 2014-06-19 LAB — GLUCOSE, CAPILLARY: Glucose-Capillary: 76 mg/dL (ref 70–99)

## 2014-06-19 NOTE — Progress Notes (Signed)
Medical Nutrition Therapy:  Appt start time: 1610 end time:  1440. 2nd visit this year Assessment:  Primary concerns today: Weight management.  Patient concerned about having to take her full lasix dose. Says she doesn' eat out often, doesn't use much salt or canned foods, doesn't think she eats much salt or sodium. She is still working on  decreasing potato chips, ciggarettes and regular soda intake. Eats ice to limit soda and other foods.  Challenges to achieving weight loss include balancing insulin doses, desire for well controlled blood sugars  and food intake as well as decreased activity since amputation . Did not bring food journals.  Meter readings reviewed: CBGs mostly in 100s with declining values averaging ~ 170-180. Says she knows what has increased her blood sugars when the values are higher.  Recent physical activity includes  Limited amounts due to being wheelchair bound. To get prosthesis soon.   Progress Towards Goal(s):  In progress.   Nutritional Diagnosis:  NB-3.2 Limited access to food or water in 2014 As related to lack of resources to purchase foods resolved.  NB-1.3 Not ready for diet/lifestyle change as related to competing values (quitting smoking) as evidenced by patient indicating that if she could shop for herself she may not be doing as well  Cutting back on favorite foods she has a hard time limiting.  NB-1.1 Food and nutrition-related knowledge deficit improving as patient better able to verbalize foods that raise blood sugar as evidenced by patient report. NB-1.4 Self-monitoring deficit As related to not checking blood sugar at least twice a day improving As evidenced by meter download with y blood sugar checks. 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     Intervention:  Nutrition education on lower salt and fluid intake. Support provided to continue to work on quitting smoking Coordination of care-  suggest safer diabetes regimem.   Monitoring/Evaluation:  Dietary intake, exercise, and body weight in 2 week(s).

## 2014-06-19 NOTE — Assessment & Plan Note (Signed)
ASSESSMENT: normal complete skin exam, no suspicious lesions, back lesion appears to be seborrheic keratosis  PLAN:  lesions are benign and patient is reassured, symptomatic skin lesions as described, referral to Dermatology is requested.  Asymptomatic benign lesions can be observed for changes or symptoms over time. Symptomatic lesions can be treated if she desires; to be scheduled at a later date with dermatology. Sun protection with sunscreens and clothing to prevent skin cancer is discussed. The signs and symptoms of malignant skin lesions are reviewed with her today.

## 2014-06-19 NOTE — Patient Instructions (Signed)
Make an appointment to talk about other diabetes medicines-  Try to go low salt/sodium to see if that helps you to urinate less. You can also try limit you liquid intake.   Sodium and Fluid Restriction Some health conditions may require you to restrict your sodium and fluid intake. Sodium is part of the salt in the blood. Sodium may be restricted because when you take in a lot of salt, you become thirsty. Limiting salt with help you become less thirsty and may make it easier to restrict fluid. Talk to your caregiver or dietician about how many cups of fluid and how many milligrams of sodium you are allowed each day. If your caregiver has restricted your sodium and fluids, usually the amount you can drink depends on several things, such as:  Your urine output.  How much fluid you are retaining.  Your blood pressure. Every 2 cups (500 mL) of fluid retained in the body becomes an extra 1 pound (0.5 kg) of body weight. The following are examples of some fluids you will have to restrict:  Tea, coffee, soda, lemonade, milk, water, and juice.  Alcoholic beverages.  Cream.  Gravy.  Ice cubes.  Soup and broth. The following are foods that become liquid at room temperature. These foods will count towards your fluid intake.  Ice cream and ice milk.  Frozen yogurt and sherbet.  Frozen ice pops.  Flavored gelatin. YOU MAY BE TAKING IN TOO MUCH FLUID IF:  Your weight increases.  Your face, hands, legs, feet, and abdomen start to swell.  You have trouble breathing. HOME CARE INSTRUCTIONS If you follow a low sodium diet closely, you will eat approximately 1,500 mg of sodium a day.   Avoid salty foods. This increases your thirst and makes fluid control more difficult. Foods high in sodium include:  Most canned foods, including most meats.  Most processed foods, including most meats.  Cheese.  Dried pasta and rice mixes.  Snack foods (chips, popcorn, pretzels, cheese puffs, salted  nuts).  Dips, sauces, and salad dressings.  Do not use salt in cooking or add salt to your meal. [name] with herbs and spices, but not those that have salt in the name. Ask your caregiver if it is okay to use salt substitutes.  Eat home-prepared meals. Use fresh ingredients. Avoid canned, frozen, or packaged meals.  Read food labels to see how much sodium is in the food. Know how much sodium you are allowed each day.  When eating out, ask for dressings and sauces on the side.  Weigh yourself every morning with an empty bladder before you eat or drink. If your weight is going up, you are retaining fluid.  Freeze fruit juice or water in an ice cube tray. Use this as part of your fluid allowance.  Brush your teeth often or rinse your mouth with mouthwash to help your dry mouth. Lemon wedges, hard sour candies, chewing gum, or breath spray may help to moisten your mouth too.  Add a slice of fresh lemon or lemon juice to water or ice. This helps satisfy your thirst.  Try frozen fruits between meals, such as grapes or strawberries.  Swallow your pills along with meals or soft foods. This helps you save your fluid for something you enjoy.  Use small cups and glasses and learn to sip fluids slowly.  Keep your home cooler. Keep the air in your home as humid as possible. Dry air increases thirst.  Avoid being out in the  hot sun. Each morning, fill a jug with the amount of water you are allowed for the day. You can use this water as a guideline for fluid allowance. Each time you take in fluid, pour an equal amount of water out of the container. This helps you to see how much fluid you are taking in. It also helps plan your fluid intake for the rest of the day. CONVERSIONS TO HELP MEASURE FLUID INTAKE  1 cup equals 8 oz (240 mL).   cup equals 6 oz (180 mL).   cup equals 5  oz (160 mL).   cup equals 4 oz (120 mL).   cup equals 2  oz (80 mL).   cup equals 2 oz (60 mL).  2 tbs equals 1  oz (30 mL).

## 2014-06-19 NOTE — Assessment & Plan Note (Addendum)
BP Readings from Last 3 Encounters:  06/19/14 191/84  05/16/14 174/80  05/08/14 175/86    Lab Results  Component Value Date   NA 143 05/16/2014   K 3.8 05/16/2014   CREATININE 0.85 05/16/2014    Assessment: Blood pressure control:  pt didn't take medication today Progress toward BP goal:    Comments: usually well controlled but didn't take medications today  Plan: Medications:  continue lasix 160mg  qd, benazepril 40 qd, atenolol 100 qd Educational resources provided:   Self management tools provided:   Other plans: need to recheck at next appt

## 2014-06-19 NOTE — Telephone Encounter (Signed)
[facility] with [place] [facility] [phone] called to continue OT  - once  A  week times 3 weeks due to new tub bench. VO given. Hilda Blades Morgaine Kimball RN 06/19/14 4:05PM

## 2014-06-19 NOTE — Patient Instructions (Signed)
General Instructions: Please take your Blood pressure medications.   We will get you to see a skin doctor for the spot on your back.   Please try to bring all your medicines next time. This will help [place] keep you safe from mistakes.   Progress Toward Treatment Goals:  Treatment Goal 02/28/2014  Hemoglobin A1C at goal  Blood pressure unchanged  Stop smoking -  Prevent falls deteriorated    Self Care Goals & Plans:  Self Care Goal 06/19/2014  Manage my medications take my medicines as prescribed; bring my medications to every visit; refill my medications on time  Monitor my health -  Eat healthy foods drink diet soda or water instead of juice or soda; eat more vegetables; eat foods that are low in salt; eat baked foods instead of fried foods  Be physically active -  Stop smoking -  Prevent falls -  Meeting treatment goals -    Home Blood Glucose Monitoring 02/28/2014  Check my blood sugar 3 times a day  When to check my blood sugar before meals     Care Management & Community Referrals:  Referral 02/28/2014  Referrals made for care management support none needed  Referrals made to community resources -

## 2014-06-19 NOTE — Progress Notes (Signed)
Subjective:    [name], female    DOB: August 09, 1961, 53 y.o.   MRN: [id number]  HPI Jennifer [name] is a 53 yo woman pmh as listed below presents for mole on her back.   Pt states she has had this spot for several years it is only now started becoming itching and painful. She said it began as what appeared as a small blackhead and she had her son pop it where some whitish thick fluid was removed but has never come to a head again.. There is no personal history of skin cancer, basal cell carcinoma, squamous cell carcinoma, malignant melanoma, actinic keratoses in the past. There is no family history of skin cancer.   In terms of her blood pressure she did not take any of her medications today but is denying any current chest pain, blurry vision, shortness of breath, dyspnea on exertion, or increase in lower extreme edema. She does state that she forgot to take her medication but otherwise when she does it is well-controlled.   Past Medical History  Diagnosis Date  . Depression   . GERD (gastroesophageal reflux disease)   . Hyperlipidemia   . Hypertension   . Glaucoma   . PVD (peripheral vascular disease) with claudication     Stents to bilateral common iliac arteries (left 2005, right 2008), on chronic plavix  . Hyperplastic colon polyp 12/2010    Per colonoscopy (12/2010) - Dr. Deatra Ina  . Asthma   . COPD 01/08/2007    PFT's 05/2007 : FEV1/FVC 82, FEV1 64% pred, FEF 25-75% 40% predicted, 16% improvement in FEV1 with bronchodilators.     . Tobacco abuse   . Chronic osteomyelitis of foot     chronic, right secondary to diabetic foot ulcers  . Polymicrobial bacterial infection 01/2013    GBS and S. aureus bacteremia // Source likely infected diabetic foot ulcer  . 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 Blood cultures 01/26/13 S. Aureus and GBS. Blood cultures Feb 6th, 8th, and 9th and March were negative.Repeat TEE 3/20  negative for vegitations  . 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 Chronic diastolic heart failure     grade 2 per 2D echocardiogram (01/2013)  . Ulcer of foot, chronic     Left. No OM per MRI (01/2013)  . DVT of upper extremity (deep vein thrombosis) 03/11/2013    Secondary to PICC line. Right brachial vein, diagnosed on 03/10/2013 Coumadin for 3 months. End date 06/10/2013   . Chronic pain syndrome 12/03/2011    Likely secondary to depression, "fibromyalgia", neuropathy, and obesity. Lumbar MRI 2014 no sig change from prior (2008) : Stable hypertrophic facet disease most notable at L4-5. Stable shallow left foraminal/extraforaminal disc protrusion at L4-5. No direct neural compression.      . Environmental allergies     Hx: of  . Fatty liver 2003    observed on ultrasound abdomen  . CHF (congestive heart failure)   . Anginal pain     '3' of 10 ischemia ruled out 9/9   . Fibromyalgia   . Chronic bronchitis     "I get it alot" (09/28/2013)  . Exertional shortness of breath   . Rheumatoid arthritis   . Arthritis of lumbar spine   . Chronic lower back pain   . Diabetic peripheral neuropathy   . Type II diabetes mellitus with peripheral circulatory disorders, uncontrolled DX: 1993  Insulin dep. Poor control. Complicated by diabetic foot ulcer and diabetic eye disease.    . 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 2/2 tobacco use, DM, and cont weight bearing on surgical wound and developed gangrene    Current Outpatient Prescriptions on File Prior to Visit  Medication Sig Dispense Refill  . albuterol (PROVENTIL HFA;VENTOLIN HFA) 108 (90 BASE) MCG/ACT inhaler Inhale 2 puffs into the lungs every 6 (six) hours as needed for wheezing or shortness of breath.      Marland Kitchen albuterol (PROVENTIL) (5 MG/ML) 0.5% nebulizer solution Take 2.5 mg by nebulization every 6 (six) hours as needed for wheezing or shortness of  breath.      Marland Kitchen atenolol (TENORMIN) 100 MG tablet Take 100 mg by mouth daily.      . baclofen (LIORESAL) 10 MG tablet TAKE 1 TABLET BY MOUTH 3 TIMES A DAY AS NEEDED FOR MUSCLE SPASMS  30 each  5  . benazepril (LOTENSIN) 40 MG tablet Take 40 mg by mouth daily.      . benzonatate (TESSALON) 100 MG capsule Take 1 capsule (100 mg total) by mouth 3 (three) times daily as needed for cough.  20 capsule  0  . buPROPion (WELLBUTRIN SR) 150 MG 12 hr tablet Take 150 mg by mouth 2 (two) times daily.      . diphenhydrAMINE (BENADRYL) 25 MG tablet Take 25 mg by mouth every 6 (six) hours.       Marland Kitchen esomeprazole (NEXIUM) 20 MG capsule Take 40 mg by mouth daily.       . fluticasone (FLONASE) 50 MCG/ACT nasal spray Place 2 sprays into both nostrils daily.    2  . Fluticasone-Salmeterol (ADVAIR) 250-50 MCG/DOSE AEPB Inhale 1 puff into the lungs 2 (two) times daily.      . furosemide (LASIX) 80 MG tablet Take 160 mg by mouth daily.      Marland Kitchen glucose blood test strip Use 3 to 4 times daily to check blood sugar. diag code 250.72. Insulin dependent  125 each  6  . guaiFENesin (MUCINEX) 600 MG 12 hr tablet Take 1 tablet (600 mg total) by mouth 3 (three) times daily.  45 tablet  0  . Insulin Lispro Prot & Lispro (HUMALOG MIX 75/25 KWIKPEN) (75-25) 100 UNIT/ML Kwikpen Inject 70-135 Units into the skin 2 (two) times daily. Inject 135 units in the morning before breakfast.  Inject 70 units in the evening before evening meal.  15 mL  11  . ipratropium (ATROVENT) 0.02 % nebulizer solution Take 0.5 mg by nebulization every 6 (six) hours as needed for wheezing or shortness of breath.      Marland Kitchen LYRICA 200 MG capsule TAKE 1 CAPSULE BY MOUTH 3 TIMES A DAY  90 capsule  5  . metFORMIN (GLUCOPHAGE) 1000 MG tablet Take 1,000 mg by mouth 2 (two) times daily with a meal.      . mupirocin cream (BACTROBAN) 2 % Apply 1 application topically 2 (two) times daily.  15 g  0  . oxyCODONE-acetaminophen (PERCOCET/ROXICET) 5-325 MG per tablet Take 2 tablets  by mouth every 4 (four) hours as needed for moderate pain or severe pain. Rx 2 of 2. Please fill 30 days after prior Rx.  360 tablet  0  . potassium chloride SA (KLOR-CON M20) 20 MEQ tablet Take 2 tablets (40 mEq total) by mouth daily.  60 tablet  2  . pravastatin (PRAVACHOL)  40 MG tablet Take 40 mg by mouth daily.      . travoprost, benzalkonium, (TRAVATAN) 0.004 % ophthalmic solution Place 1 drop into both eyes at bedtime.       . traZODone (DESYREL) 100 MG tablet Take 100 mg by mouth at bedtime as needed for sleep.       . [DISCONTINUED] Potassium Chloride ER 20 MEQ TBCR Take 40 mEq by mouth daily.  30 tablet  0   No current facility-administered medications on file prior to visit.   Social, surgical, family history reviewed with patient and updated in appropriate chart locations.   Review of Systems Review of Systems - Negative except as listed in the HPI     Objective:   Physical Exam Filed Vitals:   06/19/14 1329  BP: 191/84  Pulse: 65  Temp: 96.5 F (35.8 C)   General: sitting in wheelchair, NAD HEENT: PERRL, EOMI, no scleral icterus Cardiac: RRR, no rubs, murmurs or gallops Pulm: clear to auscultation bilaterally, moving normal volumes of air Abd: soft, nontender, nondistended, BS present Ext: warm and well perfused, 1+ pedal edema on RLE, LBKA Neuro: alert and oriented X3, cranial nerves II-XII grossly intact Complete skin exam is performed. Lesion on back with patient's observations stated as itching, tendency to be traumatized, pain, exam of this area shows normal complete skin exam, no suspicious lesions, seborrheic keratosis.  Assessment & Plan:  Please see problem oriented charting  Pt discussed with Dr. Lorella Nimrod

## 2014-06-19 NOTE — Progress Notes (Signed)
I saw patient and discussed with Dr. Algis Liming at the time of the visit.  We reviewed the resident's history and exam and pertinent patient test results.  I agree with the assessment, diagnosis, and plan of care documented in the resident's note, including plan to refer to dermatology.

## 2014-06-20 NOTE — Telephone Encounter (Signed)
Agree with VO.   ° °Thanks! °

## 2014-06-22 ENCOUNTER — Telehealth: Payer: Self-pay | Admitting: *Deleted

## 2014-06-22 NOTE — Telephone Encounter (Signed)
Pt is upset that she can not get her pain med till 06/25/14. I ask pt if she wanted to pay out of pocket - no answer. I ask pt if we call Rx for pain  In to pharmacy - would not be as strong as Percocet. Could not get an answer from pt other then she needed pain med. [facility]ses [facility]. Hilda Blades Ifeoma Vallin RN 06/22/14 3PM

## 2014-06-22 NOTE — Telephone Encounter (Signed)
06/25/14 is 30 days after the last fill. If she is using more than I prescribed, then she needs an appt

## 2014-06-22 NOTE — Telephone Encounter (Signed)
Left message on home phone ID recording - if pt needs appt due to pain.

## 2014-06-26 NOTE — Telephone Encounter (Signed)
  INTERNAL MEDICINE RESIDENCY PROGRAM After-Hours Telephone Call    Reason for call:   I received a call from [name] at 11:25 PM, 06/26/2014 indicating that she was using the bedside commode and thought she was sweating on her abdomen and wiped away the liquid to notice that she was bleeding. The patient indicated that she thought it was only a small amount of blood. Spoke w/ her son as well who claims that there was a small puncture wound that was bleeding, but says that it has stopped at this time.    Pertinent Data:   The patient denies any blood in her urine or stool, no dizziness, lightheadedness, or palpitations. The patient is not currently on any anti-coagulation. Most recent platelets 140 as of 05/02/14.   Jennifer [name] also states that she is having some mild pain in the area, but denies any fever, chills, redness, or other discharge.   Patient is quadriplegic.    Assessment / Plan / Recommendations:   As the bleeding has stopped according to the son, instructed the patient to come to the [facility] clinic tomorrow to have the area looked at.   Instructed the patient to place a dry dressing over the area for continued hemostasis.   As always, pt is advised that if symptoms worsen or new symptoms arise, they should go to an urgent care facility or to to ER for further evaluation.    Corky Sox, MD   06/26/2014, 11:25 PM

## 2014-07-03 ENCOUNTER — Ambulatory Visit (INDEPENDENT_AMBULATORY_CARE_PROVIDER_SITE_OTHER): Payer: Medicare Other | Admitting: Internal Medicine

## 2014-07-03 ENCOUNTER — Encounter: Payer: Self-pay | Admitting: Internal Medicine

## 2014-07-03 VITALS — BP 212/85 | HR 83 | Temp 97.0°F | Ht 67.0 in | Wt 275.4 lb

## 2014-07-03 DIAGNOSIS — E1159 Type 2 diabetes mellitus with other circulatory complications: Secondary | ICD-10-CM

## 2014-07-03 NOTE — Progress Notes (Signed)
Subjective:   [name] female   DOB: [birth year] 53 y.o.   MRN: [id number]  HPI: Jennifer [name] is a 53 y.o. african [place] woman with PMH significant below comes to the office for filling up paper work for [employer] and to have diabetic foot exam done.  Patient denies any complaints during this visit.   Past Medical History  Diagnosis Date  . Depression   . GERD (gastroesophageal reflux disease)   . Hyperlipidemia   . Hypertension   . Glaucoma   . PVD (peripheral vascular disease) with claudication     Stents to bilateral common iliac arteries (left 2005, right 2008), on chronic plavix  . Hyperplastic colon polyp 12/2010    Per colonoscopy (12/2010) - Dr. Deatra Ina  . Asthma   . COPD 01/08/2007    PFT's 05/2007 : FEV1/FVC 82, FEV1 64% pred, FEF 25-75% 40% predicted, 16% improvement in FEV1 with bronchodilators.     . Tobacco abuse   . Chronic osteomyelitis of foot     chronic, right secondary to diabetic foot ulcers  . Polymicrobial bacterial infection 01/2013    GBS and S. aureus bacteremia // Source likely infected diabetic foot ulcer  . 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 Blood cultures 01/26/13 S. Aureus and GBS. Blood cultures Feb 6th, 8th, and 9th and March were negative.Repeat TEE 3/20 negative for vegitations  . 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 Chronic diastolic heart failure     grade 2 per 2D echocardiogram (01/2013)  . Ulcer of foot, chronic     Left. No OM per MRI (01/2013)  . DVT of upper extremity (deep vein thrombosis) 03/11/2013    Secondary to PICC line. Right brachial vein, diagnosed on 03/10/2013 Coumadin for 3 months. End date 06/10/2013   . Chronic pain syndrome 12/03/2011    Likely secondary to depression, "fibromyalgia", neuropathy, and obesity. Lumbar MRI 2014 no sig change from prior (2008) : Stable hypertrophic facet disease most notable at L4-5.  Stable shallow left foraminal/extraforaminal disc protrusion at L4-5. No direct neural compression.      . Environmental allergies     Hx: of  . Fatty liver 2003    observed on ultrasound abdomen  . CHF (congestive heart failure)   . Anginal pain     '3' of 10 ischemia ruled out 9/9   . Fibromyalgia   . Chronic bronchitis     "I get it alot" (09/28/2013)  . Exertional shortness of breath   . Rheumatoid arthritis   . Arthritis of lumbar spine   . Chronic lower back pain   . Diabetic peripheral neuropathy   . Type II diabetes mellitus with peripheral circulatory disorders, uncontrolled DX: 1993    Insulin dep. Poor control. Complicated by diabetic foot ulcer and diabetic eye disease.    . 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 2/2 tobacco use, DM, and cont weight bearing on surgical wound and developed gangrene    Current Outpatient Prescriptions  Medication Sig Dispense Refill  . albuterol (PROVENTIL HFA;VENTOLIN HFA) 108 (90 BASE) MCG/ACT inhaler Inhale 2 puffs into the lungs every 6 (six) hours as needed for wheezing or shortness of breath.      Marland Kitchen albuterol (PROVENTIL) (5 MG/ML) 0.5% nebulizer solution Take 2.5 mg by nebulization every 6 (six) hours as needed  for wheezing or shortness of breath.      Marland Kitchen atenolol (TENORMIN) 100 MG tablet Take 100 mg by mouth daily.      . baclofen (LIORESAL) 10 MG tablet TAKE 1 TABLET BY MOUTH 3 TIMES A DAY AS NEEDED FOR MUSCLE SPASMS  30 each  5  . benazepril (LOTENSIN) 40 MG tablet Take 40 mg by mouth daily.      . benzonatate (TESSALON) 100 MG capsule Take 1 capsule (100 mg total) by mouth 3 (three) times daily as needed for cough.  20 capsule  0  . buPROPion (WELLBUTRIN SR) 150 MG 12 hr tablet Take 150 mg by mouth 2 (two) times daily.      . diphenhydrAMINE (BENADRYL) 25 MG tablet Take 25 mg by mouth every 6 (six) hours.       Marland Kitchen esomeprazole (NEXIUM) 20 MG capsule Take 40 mg by  mouth daily.       . fluticasone (FLONASE) 50 MCG/ACT nasal spray Place 2 sprays into both nostrils daily.    2  . Fluticasone-Salmeterol (ADVAIR) 250-50 MCG/DOSE AEPB Inhale 1 puff into the lungs 2 (two) times daily.      . furosemide (LASIX) 80 MG tablet Take 160 mg by mouth daily.      Marland Kitchen glucose blood test strip Use 3 to 4 times daily to check blood sugar. diag code 250.72. Insulin dependent  125 each  6  . guaiFENesin (MUCINEX) 600 MG 12 hr tablet Take 1 tablet (600 mg total) by mouth 3 (three) times daily.  45 tablet  0  . Insulin Lispro Prot & Lispro (HUMALOG MIX 75/25 KWIKPEN) (75-25) 100 UNIT/ML Kwikpen Inject 70-135 Units into the skin 2 (two) times daily. Inject 135 units in the morning before breakfast.  Inject 70 units in the evening before evening meal.  15 mL  11  . ipratropium (ATROVENT) 0.02 % nebulizer solution Take 0.5 mg by nebulization every 6 (six) hours as needed for wheezing or shortness of breath.      Marland Kitchen LYRICA 200 MG capsule TAKE 1 CAPSULE BY MOUTH 3 TIMES A DAY  90 capsule  5  . metFORMIN (GLUCOPHAGE) 1000 MG tablet Take 1,000 mg by mouth 2 (two) times daily with a meal.      . mupirocin cream (BACTROBAN) 2 % Apply 1 application topically 2 (two) times daily.  15 g  0  . oxyCODONE-acetaminophen (PERCOCET/ROXICET) 5-325 MG per tablet Take 2 tablets by mouth every 4 (four) hours as needed for moderate pain or severe pain. Rx 2 of 2. Please fill 30 days after prior Rx.  360 tablet  0  . potassium chloride SA (KLOR-CON M20) 20 MEQ tablet Take 2 tablets (40 mEq total) by mouth daily.  60 tablet  2  . pravastatin (PRAVACHOL) 40 MG tablet Take 40 mg by mouth daily.      . travoprost, benzalkonium, (TRAVATAN) 0.004 % ophthalmic solution Place 1 drop into both eyes at bedtime.       . traZODone (DESYREL) 100 MG tablet Take 100 mg by mouth at bedtime as needed for sleep.       . [DISCONTINUED] Potassium Chloride ER 20 MEQ TBCR Take 40 mEq by mouth daily.  30 tablet  0   No current  facility-administered medications for this visit.   Family History  Problem Relation Age of Onset  . Diverticulosis Mother   . Diabetes Mother   . Hypertension Mother   . Congestive Heart Failure Mother   .  Asthma Father   . CAD Sister 39    MI at age 49 per patient.  However, she has not had a stent or CABG.    History   Social History  . Marital Status: Single    Spouse Name: N/A    Number of Children: 2  . Years of Education: college   Occupational History  . Disability     previously worked as a IT sales professional History Main Topics  . Smoking status: Current Every Day Smoker -- 0.50 packs/day for 44 years    Types: Cigarettes    Last Attempt to Quit: 10/03/2013  . Smokeless tobacco: Never Used     Comment: Decreased.  . Alcohol Use: No  . Drug Use: No     Comment: 09/28/2013 "no marijuana since 2011, no crack/cocaine 1989"  . Sexual Activity: Not Currently   Other Topics Concern  . None   Social History Narrative   On disability. Lives with son in [place]. Formerly worked as Training and development officer.    Boyfriend passed away stage 4 cancer 23-Feb-2013.   S/p L BKA 2014. In wheelchair in paritially suitable apartment.    Review of Systems: Pertinent items are noted in HPI. Objective:  Physical Exam: Filed Vitals:   07/03/14 1557  BP: 212/85  Pulse: 83  Temp: 97 F (36.1 C)  TempSrc: Oral  Height: 5\' 7"  (1.702 m)  Weight: 275 lb 6.4 oz (124.921 kg)  SpO2: 96%   Constitutional: Vital signs reviewed.  Patient is a well-developed and well-nourished and is in no acute distress and cooperative with exam. Patient is sitting in a wheel chair. Cardiovascular: RRR, S1 normal, S2 normal Pulmonary/Chest: normal respiratory effort, CTAB, no wheezes, rales, or rhonchi Musculoskeletal:Left BKA.  Extremities: Left BKA. Right trace to 1+ chronic pitting edema noted. Dorsalis artery pulsations palpable in the right LE and posterior tibial pulsations not palpable. Neurological: A&O x3,  Strength is normal and symmetric bilaterally. Sensations to filaments absent in the right foot. Skin: Warm, dry and intact. Psychiatric: Normal mood and affect.    Assessment & Plan:

## 2014-07-03 NOTE — Assessment & Plan Note (Signed)
Patient has absent sensations on diabetic foot exam on the right LE.

## 2014-07-04 NOTE — Progress Notes (Signed)
Case discussed with Dr. Boggala at the time of the visit.  We reviewed the resident's history and exam and pertinent patient test results.  I agree with the assessment, diagnosis, and plan of care documented in the resident's note. 

## 2014-07-07 ENCOUNTER — Encounter: Payer: Self-pay | Admitting: *Deleted

## 2014-07-10 ENCOUNTER — Encounter: Payer: Medicare Other | Admitting: Dietician

## 2014-07-12 ENCOUNTER — Other Ambulatory Visit: Payer: Self-pay | Admitting: Internal Medicine

## 2014-07-12 DIAGNOSIS — I10 Essential (primary) hypertension: Secondary | ICD-10-CM

## 2014-07-13 ENCOUNTER — Other Ambulatory Visit: Payer: Self-pay | Admitting: *Deleted

## 2014-07-13 MED ORDER — ONETOUCH DELICA LANCETS FINE MISC: Status: DC

## 2014-07-13 MED ORDER — POTASSIUM CHLORIDE CRYS ER 20 MEQ PO TBCR: 40.0000 meq | EXTENDED_RELEASE_TABLET | Freq: Every day | ORAL | Status: DC

## 2014-07-14 ENCOUNTER — Other Ambulatory Visit: Payer: Self-pay | Admitting: *Deleted

## 2014-07-14 DIAGNOSIS — G894 Chronic pain syndrome: Secondary | ICD-10-CM

## 2014-07-14 NOTE — Telephone Encounter (Signed)
Pt # [id number]

## 2014-07-17 MED ORDER — OXYCODONE-ACETAMINOPHEN 5-325 MG PO TABS: 2.0000 | ORAL_TABLET | ORAL | Status: DC | PRN

## 2014-08-02 ENCOUNTER — Other Ambulatory Visit: Payer: Self-pay | Admitting: Internal Medicine

## 2014-08-02 ENCOUNTER — Telehealth: Payer: Self-pay | Admitting: Dietician

## 2014-08-02 DIAGNOSIS — G894 Chronic pain syndrome: Secondary | ICD-10-CM

## 2014-08-02 DIAGNOSIS — H409 Unspecified glaucoma: Secondary | ICD-10-CM

## 2014-08-02 MED ORDER — OXYCODONE-ACETAMINOPHEN 5-325 MG PO TABS: 2.0000 | ORAL_TABLET | ORAL | Status: DC | PRN

## 2014-08-02 MED ORDER — OXYCODONE-ACETAMINOPHEN 5-325 MG PO TABS
2.0000 | ORAL_TABLET | ORAL | Status: DC | PRN
Start: 2014-08-02 — End: 2014-10-04

## 2014-08-02 NOTE — Telephone Encounter (Signed)
Patient returned call. She says she has not been back to [facility] since last August because of her amputations and inability to afford the copay. She says she needs diabetic shoes and cannot afford those either. I asked how she is getting refills on the eye drops and she said she'd been able to get by on the samples they gave her. She agreed to social work  Scientific laboratory technician for possible resources.

## 2014-08-02 NOTE — Telephone Encounter (Signed)
Calling patient to see if she has followed up with eye doctor for POAG diagnosed by Dr. [place] in 2014. Patient's mail box is full so unable to leave a message.

## 2014-08-02 NOTE — Addendum Note (Signed)
Addended by: Resa Miner on: 08/02/2014 03:19 PM   Modules accepted: Orders

## 2014-08-03 ENCOUNTER — Ambulatory Visit: Payer: Medicare Other | Admitting: Internal Medicine

## 2014-08-03 ENCOUNTER — Encounter: Payer: Medicare Other | Admitting: Dietician

## 2014-08-04 ENCOUNTER — Telehealth: Payer: Self-pay | Admitting: Licensed Clinical Social Worker

## 2014-08-04 NOTE — Telephone Encounter (Signed)
[name] was referred to CSW for assistance with community resources.  Unfortunately, this worker is unaware of any programs that assist with medical copayments and/or diabetic shoes.  [name] is linked with [facility] care management and they are notified.  Jennifer [name] misses her appointment this week due to lack of transportation.  CSW placed called to Jennifer [name].  CSW left message requesting return call. CSW provided contact hours and phone number.  CSW will inquire if Jennifer [name] has inquired about Medicaid eligibility and discuss [employer] application.

## 2014-08-07 NOTE — Telephone Encounter (Signed)
Ms. Killilea returned call to [facility] this morning, left message on CSW voice mail.  CSW placed called to pt.  CSW left message requesting return call. CSW provided contact hours and phone number.

## 2014-08-08 NOTE — Telephone Encounter (Signed)
CSW was able to speak with [name] via telephone call.  [name] states her main barrier at this time is difficulty getting out of the home.  Pt currently utilizing wheelchair due to [place] in Oct 2014.  Pt states without hands-on assistance she is unable to get her wheelchair out of her back door and around to the front of the home for transportation pick up.  Pt's apartment is not handicap accessible at this time.  Pt's son is currently not able to provide the assistance he was providing in the past.  [name] has already tried [employer] to assist with ramp, but was unsuccessful.  [name] states this will be a barrier until she learns how to utilize prosthesis.  Pt was linked with [facility] but no showed several appointments to meet with [facility] CSW and landlord, thus pt's case was closed by [facility] CSW.  [name] is linked with [employer], but unable to independently get on [employer] bus.  Pt has previously attempted to apply for adult medicaid but was told she was over income.  CSW encouraged [name] to check again regarding income guidelines for adult medicaid.  CSW checked with [facility] CSW, confirmed case has been closed due to pt's non-compliance.

## 2014-08-15 ENCOUNTER — Ambulatory Visit: Payer: Medicare Other | Attending: Orthopedic Surgery | Admitting: Physical Therapy

## 2014-08-15 DIAGNOSIS — R269 Unspecified abnormalities of gait and mobility: Secondary | ICD-10-CM | POA: Insufficient documentation

## 2014-08-15 DIAGNOSIS — R5381 Other malaise: Secondary | ICD-10-CM | POA: Insufficient documentation

## 2014-08-15 DIAGNOSIS — S88119A Complete traumatic amputation at level between knee and ankle, unspecified lower leg, initial encounter: Secondary | ICD-10-CM | POA: Diagnosis not present

## 2014-08-15 DIAGNOSIS — IMO0001 Reserved for inherently not codable concepts without codable children: Secondary | ICD-10-CM | POA: Diagnosis not present

## 2014-08-16 ENCOUNTER — Ambulatory Visit: Payer: Medicare Other | Admitting: Physical Therapy

## 2014-08-17 ENCOUNTER — Encounter: Payer: Self-pay | Admitting: Licensed Clinical Social Worker

## 2014-08-17 NOTE — Telephone Encounter (Signed)
Due to patient renting, this worker unable to find program that will assist with building ramp.  CSW will refer Jennifer [name] to [employer].  CSW will send letter with information in the mail.

## 2014-08-22 ENCOUNTER — Ambulatory Visit: Payer: Medicare Other | Attending: Orthopedic Surgery | Admitting: Physical Therapy

## 2014-08-22 DIAGNOSIS — R5381 Other malaise: Secondary | ICD-10-CM | POA: Diagnosis not present

## 2014-08-22 DIAGNOSIS — S88119A Complete traumatic amputation at level between knee and ankle, unspecified lower leg, initial encounter: Secondary | ICD-10-CM | POA: Diagnosis not present

## 2014-08-22 DIAGNOSIS — R269 Unspecified abnormalities of gait and mobility: Secondary | ICD-10-CM | POA: Diagnosis not present

## 2014-08-22 DIAGNOSIS — IMO0001 Reserved for inherently not codable concepts without codable children: Secondary | ICD-10-CM | POA: Diagnosis not present

## 2014-08-23 ENCOUNTER — Encounter: Payer: Self-pay | Admitting: Gastroenterology

## 2014-08-23 ENCOUNTER — Ambulatory Visit: Payer: Medicare Other | Admitting: Physical Therapy

## 2014-08-23 DIAGNOSIS — IMO0001 Reserved for inherently not codable concepts without codable children: Secondary | ICD-10-CM | POA: Diagnosis not present

## 2014-08-29 ENCOUNTER — Encounter: Payer: Medicare Other | Admitting: Physical Therapy

## 2014-08-30 ENCOUNTER — Ambulatory Visit: Payer: Medicare Other | Admitting: Physical Therapy

## 2014-08-30 DIAGNOSIS — IMO0001 Reserved for inherently not codable concepts without codable children: Secondary | ICD-10-CM | POA: Diagnosis not present

## 2014-08-31 ENCOUNTER — Ambulatory Visit: Payer: Medicare Other | Admitting: Physical Therapy

## 2014-08-31 DIAGNOSIS — IMO0001 Reserved for inherently not codable concepts without codable children: Secondary | ICD-10-CM | POA: Diagnosis not present

## 2014-09-04 ENCOUNTER — Ambulatory Visit: Payer: Medicare Other | Admitting: Physical Therapy

## 2014-09-04 DIAGNOSIS — IMO0001 Reserved for inherently not codable concepts without codable children: Secondary | ICD-10-CM | POA: Diagnosis not present

## 2014-09-06 ENCOUNTER — Encounter: Payer: Medicare Other | Admitting: Physical Therapy

## 2014-09-08 ENCOUNTER — Ambulatory Visit: Payer: Medicare Other | Admitting: Physical Therapy

## 2014-09-08 DIAGNOSIS — IMO0001 Reserved for inherently not codable concepts without codable children: Secondary | ICD-10-CM | POA: Diagnosis not present

## 2014-09-11 ENCOUNTER — Ambulatory Visit: Payer: Medicare Other | Admitting: Physical Therapy

## 2014-09-11 DIAGNOSIS — IMO0001 Reserved for inherently not codable concepts without codable children: Secondary | ICD-10-CM | POA: Diagnosis not present

## 2014-09-12 ENCOUNTER — Encounter: Payer: Medicare Other | Admitting: Physical Therapy

## 2014-09-13 ENCOUNTER — Ambulatory Visit: Payer: Medicare Other | Admitting: Physical Therapy

## 2014-09-13 DIAGNOSIS — IMO0001 Reserved for inherently not codable concepts without codable children: Secondary | ICD-10-CM | POA: Diagnosis not present

## 2014-09-18 ENCOUNTER — Ambulatory Visit: Payer: Medicare Other | Admitting: Physical Therapy

## 2014-09-18 DIAGNOSIS — IMO0001 Reserved for inherently not codable concepts without codable children: Secondary | ICD-10-CM | POA: Diagnosis not present

## 2014-09-20 ENCOUNTER — Encounter: Payer: Medicare Other | Admitting: Physical Therapy

## 2014-09-25 ENCOUNTER — Encounter: Payer: Medicare Other | Admitting: Physical Therapy

## 2014-09-27 ENCOUNTER — Encounter: Payer: Medicare Other | Admitting: Physical Therapy

## 2014-09-28 ENCOUNTER — Ambulatory Visit: Payer: Medicare Other | Admitting: Internal Medicine

## 2014-09-28 ENCOUNTER — Encounter: Payer: Medicare Other | Admitting: Dietician

## 2014-10-02 ENCOUNTER — Ambulatory Visit: Payer: Medicare Other | Attending: Orthopedic Surgery | Admitting: Physical Therapy

## 2014-10-02 DIAGNOSIS — R5381 Other malaise: Secondary | ICD-10-CM | POA: Diagnosis not present

## 2014-10-02 DIAGNOSIS — Z9714 Presence of artificial left leg (complete) (partial): Secondary | ICD-10-CM | POA: Diagnosis not present

## 2014-10-02 DIAGNOSIS — R269 Unspecified abnormalities of gait and mobility: Secondary | ICD-10-CM | POA: Diagnosis not present

## 2014-10-02 DIAGNOSIS — Z89512 Acquired absence of left leg below knee: Secondary | ICD-10-CM | POA: Insufficient documentation

## 2014-10-04 ENCOUNTER — Encounter: Payer: Self-pay | Admitting: Internal Medicine

## 2014-10-04 ENCOUNTER — Ambulatory Visit: Payer: Medicare Other | Admitting: Physical Therapy

## 2014-10-04 ENCOUNTER — Other Ambulatory Visit: Payer: Self-pay | Admitting: Internal Medicine

## 2014-10-04 ENCOUNTER — Telehealth: Payer: Self-pay | Admitting: Dietician

## 2014-10-04 DIAGNOSIS — Z89512 Acquired absence of left leg below knee: Secondary | ICD-10-CM | POA: Diagnosis not present

## 2014-10-04 DIAGNOSIS — G894 Chronic pain syndrome: Secondary | ICD-10-CM

## 2014-10-04 MED ORDER — OXYCODONE-ACETAMINOPHEN 5-325 MG PO TABS: 2.0000 | ORAL_TABLET | ORAL | Status: DC | PRN

## 2014-10-04 NOTE — Telephone Encounter (Signed)
Patient had previously said she is not going to her eye doctor because she does not have enough money for the co-pay. Called [facility] and they will see patient and allow her to make payments. Informed patient of this and encouraged her to call and schedule an appointent

## 2014-10-05 ENCOUNTER — Ambulatory Visit (INDEPENDENT_AMBULATORY_CARE_PROVIDER_SITE_OTHER): Payer: Medicare Other | Admitting: Dietician

## 2014-10-05 ENCOUNTER — Encounter: Payer: Self-pay | Admitting: Dietician

## 2014-10-05 ENCOUNTER — Encounter: Payer: Self-pay | Admitting: Internal Medicine

## 2014-10-05 ENCOUNTER — Ambulatory Visit (INDEPENDENT_AMBULATORY_CARE_PROVIDER_SITE_OTHER): Payer: Medicare Other | Admitting: Internal Medicine

## 2014-10-05 VITALS — BP 174/57 | HR 56 | Temp 97.4°F | Wt 285.8 lb

## 2014-10-05 DIAGNOSIS — I10 Essential (primary) hypertension: Secondary | ICD-10-CM

## 2014-10-05 DIAGNOSIS — E1151 Type 2 diabetes mellitus with diabetic peripheral angiopathy without gangrene: Secondary | ICD-10-CM

## 2014-10-05 DIAGNOSIS — E1159 Type 2 diabetes mellitus with other circulatory complications: Secondary | ICD-10-CM

## 2014-10-05 DIAGNOSIS — Z23 Encounter for immunization: Secondary | ICD-10-CM

## 2014-10-05 DIAGNOSIS — E1165 Type 2 diabetes mellitus with hyperglycemia: Principal | ICD-10-CM

## 2014-10-05 DIAGNOSIS — R197 Diarrhea, unspecified: Secondary | ICD-10-CM

## 2014-10-05 DIAGNOSIS — Z89512 Acquired absence of left leg below knee: Secondary | ICD-10-CM

## 2014-10-05 DIAGNOSIS — IMO0002 Reserved for concepts with insufficient information to code with codable children: Secondary | ICD-10-CM

## 2014-10-05 DIAGNOSIS — D649 Anemia, unspecified: Secondary | ICD-10-CM | POA: Insufficient documentation

## 2014-10-05 DIAGNOSIS — I5032 Chronic diastolic (congestive) heart failure: Secondary | ICD-10-CM

## 2014-10-05 DIAGNOSIS — Z0000 Encounter for general adult medical examination without abnormal findings: Secondary | ICD-10-CM

## 2014-10-05 DIAGNOSIS — Z720 Tobacco use: Secondary | ICD-10-CM

## 2014-10-05 DIAGNOSIS — G894 Chronic pain syndrome: Secondary | ICD-10-CM

## 2014-10-05 LAB — COMPLETE METABOLIC PANEL WITH GFR
ALT: 23 U/L (ref 0–35)
AST: 32 U/L (ref 0–37)
Albumin: 3.4 g/dL — ABNORMAL LOW (ref 3.5–5.2)
Alkaline Phosphatase: 91 U/L (ref 39–117)
BUN: 14 mg/dL (ref 6–23)
CO2: 31 mEq/L (ref 19–32)
Calcium: 9 mg/dL (ref 8.4–10.5)
Chloride: 104 mEq/L (ref 96–112)
Creat: 0.66 mg/dL (ref 0.50–1.10)
GFR, Est African American: >89 mL/min
GFR, Est Non African American: >89 mL/min
Glucose, Bld: 90 mg/dL (ref 70–99)
Potassium: 3.9 mEq/L (ref 3.5–5.3)
Sodium: 145 mEq/L (ref 135–145)
Total Bilirubin: 0.3 mg/dL (ref 0.3–1.2)
Total Protein: 6.9 g/dL (ref 6.0–8.3)

## 2014-10-05 LAB — GLUCOSE, CAPILLARY: Glucose-Capillary: 84 mg/dL (ref 70–99)

## 2014-10-05 LAB — LIPID PANEL
Cholesterol: 129 mg/dL (ref 0–200)
HDL: 32 mg/dL — ABNORMAL LOW (ref >39)
LDL Cholesterol: 81 mg/dL (ref 0–99)
Total CHOL/HDL Ratio: 4 Ratio
Triglycerides: 79 mg/dL (ref <150)
VLDL: 16 mg/dL (ref 0–40)

## 2014-10-05 LAB — POCT GLYCOSYLATED HEMOGLOBIN (HGB A1C): Hemoglobin A1C: 8.2

## 2014-10-05 LAB — PRO B NATRIURETIC PEPTIDE: Pro B Natriuretic peptide (BNP): 238.1 pg/mL — ABNORMAL HIGH (ref <126)

## 2014-10-05 MED ORDER — BACLOFEN 10 MG PO TABS: 10.0000 mg | ORAL_TABLET | Freq: Three times a day (TID) | ORAL | Status: DC

## 2014-10-05 MED ORDER — AMLODIPINE BESYLATE 5 MG PO TABS: 5.0000 mg | ORAL_TABLET | Freq: Every day | ORAL | Status: DC

## 2014-10-05 NOTE — Assessment & Plan Note (Signed)
She is not taking the 160 lsaix daily and insisting on decreased dose. Her lungs are clear but has sig LE edema. She needs to keep leg elevated when every sitting. No pul edema - all peripheral. Since not taking the lasix anyway, will decrease to 80 QD. No way to monitor weights. Asked Tim to help with scales. Since we are decreasing lasix, I need her to weigh herself daily. Our weights are not accurate as now with artificial leg.

## 2014-10-05 NOTE — Assessment & Plan Note (Addendum)
Lab Results  Component Value Date   HGBA1C 8.2 10/05/2014     A1C is up from 7.4 in May. She saw [name] today and didn't want to discuss her DM control. CBG's are all over the place from 92 - "hi". AM fasting can be 92 all the way up to 300. She uses 75 / 25 so difficult to titrate. Would need bolus and prandial for good control and she doesn't have the initiative to undertake that. Leave dosing as is and attempt to discuss next appt. [name] made eye appt. Checking microalb bc insurance Co checked Ua and had protein and pt now concerned about kidneys. One ACEI. Needs better BP and HTN control but pt compliance is the limiting factor.

## 2014-10-05 NOTE — Assessment & Plan Note (Signed)
She was not interested in talking to [name] about DM or weight loss strategies bc wants surgery. She did not go to the seminar for bariatric surgery so gave her info again. Told her she needs to stop tobacco prior to surgery.

## 2014-10-05 NOTE — Progress Notes (Signed)
   Subjective:    [name], female    DOB: 03/29/1961, 53 y.o.   MRN: [id number]  HPI  Please see the A&P for the status of the pt's chronic medical problems.   Review of Systems  Constitutional: Negative for fever.  HENT: Negative for congestion.   Eyes: Positive for redness.  Respiratory: Negative for cough and shortness of breath.   Cardiovascular: Positive for leg swelling. Negative for chest pain.  Gastrointestinal: Positive for abdominal pain and diarrhea.  Endocrine: Positive for polyuria.  Genitourinary: Positive for frequency.  Musculoskeletal: Positive for back pain and myalgias.  Skin:       C/o skin lesions  Psychiatric/Behavioral: Positive for sleep disturbance.       Objective:   Physical Exam  Constitutional: She is oriented to person, place, and time. She appears well-developed and well-nourished. No distress.  HENT:  Head: Normocephalic and atraumatic.  Right Ear: External ear normal.  Left Ear: External ear normal.  Nose: Nose normal.  Eyes: Right conjunctiva is injected. Left conjunctiva is injected.  Cardiovascular: Normal rate, regular rhythm and normal heart sounds.   Pulmonary/Chest: Effort normal and breath sounds normal.  Abdominal: Soft. There is no tenderness.  Musculoskeletal: She exhibits edema.  Neurological: She is alert and oriented to person, place, and time.  Skin: Skin is warm and dry. She is not diaphoretic.  Psychiatric: She has a normal mood and affect. Her behavior is normal. Judgment and thought content normal.          Assessment & Plan:

## 2014-10-05 NOTE — Patient Instructions (Addendum)
1. Call [facility] / DR Elvera Lennox / [phone]  [place]  2. Start Norvasc for your blood pressure 3. Return in 204 weeks to check blood pressure 4. Check your weight daily. If you gain 3 pounds, let me know 5. I gave you three paper prescriptions of your pain medicine 6. I will look into Chantix for smoking 7. Bring in a stool sample 8. Go to the weight loose surgery for orientation

## 2014-10-05 NOTE — Assessment & Plan Note (Addendum)
This was not discussed this visit. Did give her three paper Rx of her percocet. No UDS needed today.  We did discuss her Baclofen. She states she gets a lot of muscle spasms in R leg, L stump, and back. She was taking Baclofen TID but I cut that back. She states that without it, she can't do anything - like getting to toilet. I increased it back to #90.

## 2014-10-05 NOTE — Assessment & Plan Note (Signed)
Horrible control. On Atenolol 100 and benazepril 40 and lasix 160 but only takes the lasix about 3-4 times weekly 2/2 polyuria. She is insisting on going down on the lasix so decreased it to 80 QD. Will add Norvasc 5 and get her back in in 2-4 weeks for recheck.   BP Readings from Last 3 Encounters:  10/05/14 174/57  07/03/14 212/85  06/19/14 191/84

## 2014-10-05 NOTE — Assessment & Plan Note (Signed)
I need to ask about OSA next appt.  She did not go to [facility] and now getting more lesions so I gave her the tele #.  C/O months of LLQ ? RLQ intermittent pain. Food doesn't make it worse. Intermittent D - loose urgent stools - but also nl, formed stools inbtw the D. No ABX since May but has had this since summer. Two possible dx - C Diff and divertic dz although unlikely since sxs for months. Start with CBC and CMP and stool for C diff.

## 2014-10-05 NOTE — Progress Notes (Signed)
Medical Nutrition Therapy:  Appt start time: 7356 end time:  7014. 3rd visit this year Assessment:  Primary concerns today: Weight management.  Patient is not interested in discussing food intake today. She wants to have bariatric surgery. Takes extra 60 units insulin in evening after taking 70 units when blood sugar is high. Is resistant to thinking her blood sugar will ever be too low.   Challenges to achieving weight loss include balancing insulin doses, desire for well controlled blood sugars  and food intake. Activity improving.  Meter shows average of 245 mg/dl, A1C increased. Patient denies food or medicine or activity changes.  She is interested in basal bolus therapy/MDI (sugested to allow for safer correction of high blood sugars)    Progress Towards Goal(s):  In progress.   Nutritional Diagnosis:  .  NB-1.3 Not ready for diet/lifestyle change as related to competing values (quitting smoking) as evidenced by patient indicating that if she could shop for herself she may not be doing as well  Cutting back on favorite foods she has a hard time limiting.  NB-1.1 Food and nutrition-related knowledge deficit improving as patient better able to verbalize foods that raise blood sugar as evidenced by patient report. NB-1.4 Self-monitoring deficit As related to not checking blood sugar at least twice a day improving As evidenced by meter download with  blood sugar checks. 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    Intervention:  Nutrition education about MDI.  Coordination of care- suggest safer diabetes regimem.   Monitoring/Evaluation:  Dietary intake, exercise, and body weight prn.

## 2014-10-05 NOTE — Assessment & Plan Note (Signed)
Now has prosthesis and able to go up the stairs into her apartment. Able to walk in her house. Still uses manual WC for long distances.

## 2014-10-05 NOTE — Patient Instructions (Signed)
Your eye doctor appointment is next Friday 10-13-14 at 2: 20 PM.

## 2014-10-05 NOTE — Assessment & Plan Note (Addendum)
Still smoking 1/2 PPD. Patches and Wellbutrin did not help. Will look into interactions of wellbutrin and chantix before Rxing.   Researched and spoke to Dr Maudie Mercury. Level C. Risk of cont smoking is greater than the interaction. Will Rx. At visit, I informed pt of side effects - SI, bad dreams, etc

## 2014-10-06 ENCOUNTER — Other Ambulatory Visit: Payer: Self-pay

## 2014-10-06 LAB — CBC WITH DIFFERENTIAL/PLATELET
Basophils Absolute: 0 10*3/uL (ref 0.0–0.1)
Basophils Relative: 0 % (ref 0–1)
Eosinophils Absolute: 0.1 10*3/uL (ref 0.0–0.7)
Eosinophils Relative: 2 % (ref 0–5)
HCT: 41.6 % (ref 36.0–46.0)
Hemoglobin: 13.3 g/dL (ref 12.0–15.0)
Lymphocytes Relative: 47 % — ABNORMAL HIGH (ref 12–46)
Lymphs Abs: 3.1 10*3/uL (ref 0.7–4.0)
MCH: 30.5 pg (ref 26.0–34.0)
MCHC: 32 g/dL (ref 30.0–36.0)
MCV: 95.4 fL (ref 78.0–100.0)
Monocytes Absolute: 0.3 10*3/uL (ref 0.1–1.0)
Monocytes Relative: 5 % (ref 3–12)
Neutro Abs: 3 10*3/uL (ref 1.7–7.7)
Neutrophils Relative %: 46 % (ref 43–77)
Platelets: 137 10*3/uL — ABNORMAL LOW (ref 150–400)
RBC: 4.36 MIL/uL (ref 3.87–5.11)
RDW: 15.4 % (ref 11.5–15.5)
WBC: 6.5 10*3/uL (ref 4.0–10.5)

## 2014-10-06 LAB — ANEMIA PANEL
%SAT: 22 % (ref 20–55)
ABS Retic: 55.8 10*3/uL (ref 19.0–186.0)
Ferritin: 79 ng/mL (ref 10–291)
Folate: 12.5 ng/mL
Iron: 68 ug/dL (ref 42–145)
RBC.: 4.36 MIL/uL (ref 3.87–5.11)
Retic Ct Pct: 1.28 % (ref 0.4–2.3)
TIBC: 315 ug/dL (ref 250–470)
UIBC: 247 ug/dL (ref 125–400)
Vitamin B-12: 477 pg/mL (ref 211–911)

## 2014-10-06 LAB — MICROALBUMIN / CREATININE URINE RATIO
Creatinine, Urine: 189.1 mg/dL
Microalb Creat Ratio: 298.8 mg/g — ABNORMAL HIGH (ref 0.0–30.0)
Microalb, Ur: 56.5 mg/dL — ABNORMAL HIGH (ref <2.0)

## 2014-10-06 MED ORDER — VARENICLINE TARTRATE 1 MG PO TABS: 1.0000 mg | ORAL_TABLET | Freq: Two times a day (BID) | ORAL | Status: DC

## 2014-10-06 MED ORDER — VARENICLINE TARTRATE 0.5 MG PO TABS: ORAL_TABLET | ORAL | Status: DC

## 2014-10-06 NOTE — Addendum Note (Signed)
Addended by: Larey Dresser A on: 10/06/2014 11:31 AM   Modules accepted: Orders

## 2014-10-09 ENCOUNTER — Ambulatory Visit: Payer: Medicare Other | Admitting: Physical Therapy

## 2014-10-09 DIAGNOSIS — Z89512 Acquired absence of left leg below knee: Secondary | ICD-10-CM | POA: Diagnosis not present

## 2014-10-09 NOTE — Addendum Note (Signed)
Addended by: Hulan Fray on: 10/09/2014 06:37 PM   Modules accepted: Orders

## 2014-10-10 ENCOUNTER — Encounter: Payer: Self-pay | Admitting: Internal Medicine

## 2014-10-11 ENCOUNTER — Ambulatory Visit: Payer: Medicare Other | Admitting: Physical Therapy

## 2014-10-11 ENCOUNTER — Other Ambulatory Visit: Payer: Self-pay | Admitting: Internal Medicine

## 2014-10-11 DIAGNOSIS — Z89512 Acquired absence of left leg below knee: Secondary | ICD-10-CM | POA: Diagnosis not present

## 2014-10-12 NOTE — Telephone Encounter (Signed)
Should not be out of Chantix 1 mg yet.

## 2014-10-13 LAB — HM DIABETES EYE EXAM

## 2014-10-17 ENCOUNTER — Ambulatory Visit: Payer: Medicare Other | Admitting: Physical Therapy

## 2014-10-17 DIAGNOSIS — Z89512 Acquired absence of left leg below knee: Secondary | ICD-10-CM | POA: Diagnosis not present

## 2014-10-19 ENCOUNTER — Encounter: Payer: Self-pay | Admitting: Internal Medicine

## 2014-10-19 ENCOUNTER — Ambulatory Visit (INDEPENDENT_AMBULATORY_CARE_PROVIDER_SITE_OTHER): Payer: Medicare Other | Admitting: Internal Medicine

## 2014-10-19 ENCOUNTER — Ambulatory Visit (INDEPENDENT_AMBULATORY_CARE_PROVIDER_SITE_OTHER): Payer: Medicare Other | Admitting: Dietician

## 2014-10-19 ENCOUNTER — Encounter: Payer: Self-pay | Admitting: Dietician

## 2014-10-19 VITALS — BP 143/75 | HR 60 | Temp 97.9°F | Ht 67.0 in | Wt 272.2 lb

## 2014-10-19 DIAGNOSIS — Z89512 Acquired absence of left leg below knee: Secondary | ICD-10-CM

## 2014-10-19 DIAGNOSIS — E1151 Type 2 diabetes mellitus with diabetic peripheral angiopathy without gangrene: Secondary | ICD-10-CM

## 2014-10-19 DIAGNOSIS — IMO0002 Reserved for concepts with insufficient information to code with codable children: Secondary | ICD-10-CM

## 2014-10-19 DIAGNOSIS — Z0000 Encounter for general adult medical examination without abnormal findings: Secondary | ICD-10-CM

## 2014-10-19 DIAGNOSIS — E1165 Type 2 diabetes mellitus with hyperglycemia: Secondary | ICD-10-CM

## 2014-10-19 DIAGNOSIS — R197 Diarrhea, unspecified: Secondary | ICD-10-CM

## 2014-10-19 DIAGNOSIS — I10 Essential (primary) hypertension: Secondary | ICD-10-CM

## 2014-10-19 DIAGNOSIS — Z720 Tobacco use: Secondary | ICD-10-CM

## 2014-10-19 LAB — GLUCOSE, CAPILLARY
Glucose-Capillary: 69 mg/dL — ABNORMAL LOW (ref 70–99)
Glucose-Capillary: 69 mg/dL — ABNORMAL LOW (ref 70–99)
Glucose-Capillary: 110 mg/dL — ABNORMAL HIGH (ref 70–99)

## 2014-10-19 NOTE — Progress Notes (Signed)
Medical Nutrition Therapy:  Appt start time: 9470 end time:  1115. 3rd visit this year Assessment:  Primary concerns today: Weight management.  Patient is not interested in discussing food intake today. She wants to have bariatric surgery. Took 80 units insulin last nigth ~ midnight and had low blood sugar today in office.   Challenges to achieving weight loss include balancing insulin doses, desire for well controlled blood sugars  and competing values with changing food intake. Activity improving. She is interested in basal bolus therapy/MDI (sugested to allow for safer correction of high blood sugars)    Progress Towards Goal(s):  In progress.   Nutritional Diagnosis:  .  NB-1.3 Not ready for diet/lifestyle change as related to competing values (quitting smoking) as evidenced by patient indicating that if she could shop for herself she may not be doing as well   NB-1.1 Food and nutrition-related knowledge deficit improving as patient better able to verbalize foods that raise blood sugar as evidenced by patient report. NB-1.4 Self-monitoring deficit As related to not checking blood sugar at least twice a day unknown as patient did not bring her meter today    Intervention:  Nutrition education about bariatric surgery- assisted patient in signing up for required education seminar .  On 10-31-14. Also education about ways to keep from gaining weight weight quitting smoking Coordination of care- patient wants to change her insulin regimen. Asks CDE to coordinate for her next visit. Suggest TDD ~ 150 units/day ~ 1.2 units/kg and ~ 20% less than current TDD. Spikt 50% basal and 50% bolus: 75 units lantus once daily with 25 units each meal  Monitoring/Evaluation:  Dietary intake, exercise, and body weight prn.

## 2014-10-19 NOTE — Addendum Note (Signed)
Addended by: Orson Gear on: 10/19/2014 01:43 PM   Modules accepted: Orders

## 2014-10-19 NOTE — Patient Instructions (Signed)
Thank you for your visit today.   Please return to the internal medicine clinic in November to see Dr. [name]. I have ordered you a mammogram today.    Please be sure to bring all of your medications with you to every visit; this includes herbal supplements, vitamins, eye drops, and any over-the-counter medications.   Should you have any questions regarding your medications and/or any new or worsening symptoms, please be sure to call the clinic at [phone].   If you believe that you are suffering from a life threatening condition or one that may result in the loss of limb or function, then you should call 911 or proceed to the nearest Emergency Department.

## 2014-10-19 NOTE — Progress Notes (Signed)
[name], female   DOB: [birth year], 53 y.o.   MRN: [id number]    Subjective:   [name] female    DOB: 03-29-1961 53 y.o.    MRN: [id number] Health Maintenance Due: Health Maintenance Due  Topic Date Due  . Ophthalmology Exam  06/06/2014  . Mammogram  10/04/2014    _________________________________________________  HPI: [name] is a 53 y.o. female here for a BP recheck.  Pt has a PMH outlined below.  Please see problem-based charting assessment and plan note for further details of medical issues addressed at today's visit.  PMH: Past Medical History  Diagnosis Date  . Depression   . GERD (gastroesophageal reflux disease)   . Hyperlipidemia   . Hypertension   . Glaucoma   . PVD (peripheral vascular disease) with claudication     Stents to bilateral common iliac arteries (left 2005, right 2008), on chronic plavix  . Hyperplastic colon polyp 12/2010    Per colonoscopy (12/2010) - Dr. Deatra Ina  . Asthma   . COPD 01/08/2007    PFT's 05/2007 : FEV1/FVC 82, FEV1 64% pred, FEF 25-75% 40% predicted, 16% improvement in FEV1 with bronchodilators.     . Tobacco abuse   . Chronic osteomyelitis of foot     chronic, right secondary to diabetic foot ulcers  . Polymicrobial bacterial infection 01/2013    GBS and S. aureus bacteremia // Source likely infected diabetic foot ulcer  . 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 Blood cultures 01/26/13 S. Aureus and GBS. Blood cultures Feb 6th, 8th, and 9th and March were negative.Repeat TEE 3/20 negative for vegitations  . 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 Chronic diastolic heart failure     grade 2 per 2D echocardiogram (01/2013)  . Ulcer of foot, chronic     Left. No OM per MRI (01/2013)  . DVT of upper extremity (deep vein thrombosis) 03/11/2013    Secondary to PICC line. Right brachial vein, diagnosed on 03/10/2013 Coumadin  for 3 months. End date 06/10/2013   . Chronic pain syndrome 12/03/2011    Likely secondary to depression, "fibromyalgia", neuropathy, and obesity. Lumbar MRI 2014 no sig change from prior (2008) : Stable hypertrophic facet disease most notable at L4-5. Stable shallow left foraminal/extraforaminal disc protrusion at L4-5. No direct neural compression.      . Environmental allergies     Hx: of  . Fatty liver 2003    observed on ultrasound abdomen  . CHF (congestive heart failure)   . Anginal pain     '3' of 10 ischemia ruled out 9/9   . Fibromyalgia   . Chronic bronchitis     "I get it alot" (09/28/2013)  . Exertional shortness of breath   . Rheumatoid arthritis   . Arthritis of lumbar spine   . Chronic lower back pain   . Diabetic peripheral neuropathy   . Type II diabetes mellitus with peripheral circulatory disorders, uncontrolled DX: 1993    Insulin dep. Poor control. Complicated by diabetic foot ulcer and diabetic eye disease.    . 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 2/2 tobacco use, DM, and cont weight bearing on surgical wound and developed gangrene     Medications: Current Outpatient Prescriptions on File Prior to Visit  Medication Sig Dispense Refill  .  albuterol (PROVENTIL HFA;VENTOLIN HFA) 108 (90 BASE) MCG/ACT inhaler Inhale 2 puffs into the lungs every 6 (six) hours as needed for wheezing or shortness of breath.      Marland Kitchen albuterol (PROVENTIL) (5 MG/ML) 0.5% nebulizer solution USE 0.5ML(S) IN NEBULIZER EVERY 6 HOURS AS NEEDED FOR WHEEZING  20 mL  4  . amLODipine (NORVASC) 5 MG tablet Take 1 tablet (5 mg total) by mouth daily.  30 tablet  1  . atenolol (TENORMIN) 100 MG tablet Take 100 mg by mouth daily.      . baclofen (LIORESAL) 10 MG tablet Take 1 tablet (10 mg total) by mouth 3 (three) times daily.  90 each  5  . benazepril (LOTENSIN) 40 MG tablet Take 40 mg by mouth daily.      . benzonatate  (TESSALON) 100 MG capsule Take 1 capsule (100 mg total) by mouth 3 (three) times daily as needed for cough.  20 capsule  0  . buPROPion (WELLBUTRIN SR) 150 MG 12 hr tablet Take 150 mg by mouth 2 (two) times daily.      . diphenhydrAMINE (BENADRYL) 25 MG tablet Take 25 mg by mouth every 6 (six) hours.       Marland Kitchen EASY TOUCH PEN NEEDLES 31G X 8 MM MISC USE TO INJECT INSULIN TWICE DAILY  100 each  11  . esomeprazole (NEXIUM) 20 MG capsule Take 40 mg by mouth daily.       . fluticasone (FLONASE) 50 MCG/ACT nasal spray Place 2 sprays into both nostrils daily.    2  . Fluticasone-Salmeterol (ADVAIR) 250-50 MCG/DOSE AEPB Inhale 1 puff into the lungs 2 (two) times daily.      . furosemide (LASIX) 80 MG tablet Take 1 tablet (80 mg total) by mouth daily.  30 tablet  5  . glucose blood test strip Use 3 to 4 times daily to check blood sugar. diag code 250.72. Insulin dependent  125 each  6  . guaiFENesin (MUCINEX) 600 MG 12 hr tablet Take 1 tablet (600 mg total) by mouth 3 (three) times daily.  45 tablet  0  . Insulin Lispro Prot & Lispro (HUMALOG MIX 75/25 KWIKPEN) (75-25) 100 UNIT/ML Kwikpen Inject 70-135 Units into the skin 2 (two) times daily. Inject 135 units in the morning before breakfast.  Inject 70 units in the evening before evening meal.  15 mL  11  . ipratropium (ATROVENT) 0.02 % nebulizer solution USE 1 VIAL VIA NEBULIZER EVERY 6 HOURS AS NEEDED FOR WHEEZING  75 mL  2  . LYRICA 200 MG capsule TAKE 1 CAPSULE BY MOUTH 3 TIMES A DAY  90 capsule  5  . mupirocin cream (BACTROBAN) 2 % Apply 1 application topically 2 (two) times daily.  15 g  0  . ONETOUCH DELICA LANCETS FINE MISC Use to check CBG QID. 250.00 uncontrolled DM.  100 each  11  . oxyCODONE-acetaminophen (PERCOCET/ROXICET) 5-325 MG per tablet Take 2 tablets by mouth every 4 (four) hours as needed for moderate pain or severe pain. Rx 3 of 3. Please fill 30 days after prior Rx.  360 tablet  0  . potassium chloride SA (KLOR-CON M20) 20 MEQ tablet Take  2 tablets (40 mEq total) by mouth daily.  180 tablet  1  . pravastatin (PRAVACHOL) 40 MG tablet Take 40 mg by mouth daily.      . travoprost, benzalkonium, (TRAVATAN) 0.004 % ophthalmic solution Place 1 drop into both eyes at bedtime.       Marland Kitchen  traZODone (DESYREL) 100 MG tablet Take 100 mg by mouth at bedtime as needed for sleep.       . varenicline (CHANTIX) 1 MG tablet Take 1 tablet (1 mg total) by mouth 2 (two) times daily. Use this bottle after the other bottle. Stop smoking when you start this dosage.  60 tablet  0  . [DISCONTINUED] Potassium Chloride ER 20 MEQ TBCR Take 40 mEq by mouth daily.  30 tablet  0   No current facility-administered medications on file prior to visit.    Allergies: Allergies  Allergen Reactions  . Iohexol      Desc: IV CONTRAST CAUSE NEPHROPATHY IN 2007   . Ivp Dye [Iodinated Diagnostic Agents]   . Morphine Sulfate Itching and Rash    FH: Family History  Problem Relation Age of Onset  . Diverticulosis Mother   . Diabetes Mother   . Hypertension Mother   . Congestive Heart Failure Mother   . Asthma Father   . CAD Sister 70    MI at age 21 per patient.  However, she has not had a stent or CABG.     SH: History   Social History  . Marital Status: Single    Spouse Name: N/A    Number of Children: 2  . Years of Education: college   Occupational History  . Disability     previously worked as a IT sales professional History Main Topics  . Smoking status: Current Every Day Smoker -- 0.50 packs/day for 44 years    Types: Cigarettes    Last Attempt to Quit: 10/03/2013  . Smokeless tobacco: Never Used     Comment: Decreased. down to 1/2 ppd  . Alcohol Use: No  . Drug Use: No     Comment: 09/28/2013 "no marijuana since 2011, no crack/cocaine 1989"  . Sexual Activity: Not Currently   Other Topics Concern  . None   Social History Narrative   On disability. Lives with son in [place]. Formerly worked as Training and development officer.    Boyfriend passed away stage 4 cancer  03-17-13.   S/p L BKA 2014. In wheelchair in paritially suitable apartment.     Review of Systems: Constitutional: Negative for fever, chills and weight loss.  Eyes: Negative for blurred vision.  Respiratory: Negative for cough and shortness of breath.  Cardiovascular: Negative for chest pain, palpitations and leg swelling.  Gastrointestinal: +nausea, +abdominal pain, diarrhea, constipation and blood in stool.  Genitourinary: Negative for dysuria, urgency and frequency.  Musculoskeletal: +myalgias and back pain.  Neurological: Negative for dizziness, weakness and headaches.     Objective:   Vital Signs: Filed Vitals:   10/19/14 1002 10/19/14 1004  BP: 143/75   Pulse: 60   Temp: 97.9 F (36.6 C)   TempSrc: Oral   Height: 5\' 7"  (1.702 m)   Weight:  272 lb 3.2 oz (123.469 kg)  SpO2: 95%       BP Readings from Last 3 Encounters:  10/19/14 143/75  10/05/14 174/57  07/03/14 212/85    Physical Exam: Constitutional: Vital signs reviewed.  Patient is well-developed and well-nourished in NAD. Head: Normocephalic and atraumatic. Eyes: PERRL, EOMI, conjunctivae nl, no scleral icterus.  Neck: Supple. Cardiovascular: RRR, no MRG. Pulmonary/Chest: normal effort, CTAB, no wheezes, rales, or rhonchi. Abdominal: Soft. NT/ND +BS. Neurological: A&O x3, cranial nerves II-XII are grossly intact, moving all extremities. Skin: Warm, dry and intact.   Assessment & Plan:   Assessment and plan was discussed and formulated with  my attending.

## 2014-10-19 NOTE — Assessment & Plan Note (Addendum)
Pt reports that she is no longer taking metformin 1000mg  twice daily.  CBG low today at 69 but improved to 110 with a snack.   -plans to see [name] today

## 2014-10-19 NOTE — Assessment & Plan Note (Addendum)
-  pt requests mammogram which was ordered

## 2014-10-19 NOTE — Patient Instructions (Signed)
You Can Quit Smoking If you are ready to quit smoking or are thinking about it, congratulations! You have chosen to help yourself be healthier and live longer! There are lots of different ways to quit smoking. Nicotine gum, nicotine patches, a nicotine inhaler, or nicotine nasal spray can help with physical craving. Hypnosis, support groups, and medicines help break the habit of smoking. TIPS TO GET OFF AND STAY OFF CIGARETTES  Learn to predict your moods. Do not let a bad situation be your excuse to have a cigarette. Some situations in your life might tempt you to have a cigarette.  Ask friends and co-workers not to smoke around you.  Make your home smoke-free.  Never have "just one" cigarette. It leads to wanting another and another. Remind yourself of your decision to quit.  On a card, make a list of your reasons for not smoking. Read it at least the same number of times a day as you have a cigarette. Tell yourself everyday, "I do not want to smoke. I choose not to smoke."  Ask someone at home or work to help you with your plan to quit smoking.  Have something planned after you eat or have a cup of coffee. Take a walk or get other exercise to perk you up. This will help to keep you from overeating.  Try a relaxation exercise to calm you down and decrease your stress. Remember, you may be tense and nervous the first two weeks after you quit. This will pass.  Find new activities to keep your hands busy. Play with a pen, coin, or rubber band. Doodle or draw things on paper.  Brush your teeth right after eating. This will help cut down the craving for the taste of tobacco after meals. You can try mouthwash too.  Try gum, breath mints, or diet candy to keep something in your mouth. IF YOU SMOKE AND WANT TO QUIT:  Do not stock up on cigarettes. Never buy a carton. Wait until one pack is finished before you buy another.  Never carry cigarettes with you at work or at home.  Keep cigarettes  as far away from you as possible. Leave them with someone else.  Never carry matches or a lighter with you.  Ask yourself, "Do I need this cigarette or is this just a reflex?"  Bet with someone that you can quit. Put cigarette money in a piggy bank every morning. If you smoke, you give up the money. If you do not smoke, by the end of the week, you keep the money.  Keep trying. It takes 21 days to change a habit!  Talk to your doctor about using medicines to help you quit. These include nicotine replacement gum, lozenges, or skin patches.   No. 11: Eat Fruits and Veggies Don't try to diet while giving up cigarettes -- too much deprivation is bound to backfire. Instead, focus on eating more fruits, vegetables, and low-fat dairy products. [place] study suggests these foods make cigarettes taste terrible. This gives you a leg up in fighting your cravings while providing disease-fighting nutrients.   Emergency Foods (for when your mouth feels empty, like it needs a cigarette)  Vegetable sticks (carrots, celery, cucumbers, green peppers, etc.) Sugar-free gum Sugar-free hard candy Sugar-free Breath mints   You may feel a strong desire for sweets right after you quit smoking. Often sweets can help you fight the urge to smoke. If you crave sweets, try these:  Frozen Treats  Frozen  bananas or grapes Frozen 100% juice bars No-sugar-added sherbet Low-calorie ice cream bars Nonfat frozen yogurt

## 2014-10-19 NOTE — Assessment & Plan Note (Signed)
Pt still smoking 1/2 ppd.  She was prescribed chantix but has not picked up the prescription. -states she plans to start chantix "soon"

## 2014-10-19 NOTE — Assessment & Plan Note (Signed)
BP today 143/75.  She reports compliance with amlodipine 5mg , atenolol 1000mg , lasix 80mg , and benazepril 40mg  daily.  BP has improved from last OV on 10/05/14 of 174/57.  Last OV decreased lasix to 80mg  and added amlodipine 5mg .  -continue current meds

## 2014-10-20 ENCOUNTER — Ambulatory Visit: Payer: Medicare Other | Admitting: Physical Therapy

## 2014-10-20 DIAGNOSIS — Z89512 Acquired absence of left leg below knee: Secondary | ICD-10-CM | POA: Diagnosis not present

## 2014-10-20 LAB — CLOSTRIDIUM DIFFICILE BY PCR: Toxigenic C. Difficile by PCR: NOT DETECTED

## 2014-10-24 ENCOUNTER — Ambulatory Visit: Payer: Medicare Other | Admitting: Physical Therapy

## 2014-10-26 ENCOUNTER — Encounter: Payer: Self-pay | Admitting: Physical Therapy

## 2014-10-26 ENCOUNTER — Ambulatory Visit: Payer: Medicare Other | Attending: Orthopedic Surgery | Admitting: Physical Therapy

## 2014-10-26 ENCOUNTER — Other Ambulatory Visit: Payer: Self-pay | Admitting: Internal Medicine

## 2014-10-26 DIAGNOSIS — Z89512 Acquired absence of left leg below knee: Secondary | ICD-10-CM

## 2014-10-26 DIAGNOSIS — R6889 Other general symptoms and signs: Secondary | ICD-10-CM | POA: Diagnosis not present

## 2014-10-26 DIAGNOSIS — R269 Unspecified abnormalities of gait and mobility: Secondary | ICD-10-CM | POA: Diagnosis not present

## 2014-10-26 NOTE — Therapy (Signed)
Physical Therapy Treatment  Patient Details  Name: Jennifer [name] MRN: [id number] Date of Birth: 10-16-1961  Encounter Date: 10/26/2014      PT End of Session - 10/26/14 1213    Visit Number 17   Number of Visits 24   Date for PT Re-Evaluation 11/16/14   PT Start Time 1035   PT Stop Time 1100   PT Time Calculation (min) 25 min   Equipment Utilized During Treatment Gait belt      Past Medical History  Diagnosis Date  . Depression   . GERD (gastroesophageal reflux disease)   . Hyperlipidemia   . Hypertension   . Glaucoma   . PVD (peripheral vascular disease) with claudication     Stents to bilateral common iliac arteries (left 2005, right 2008), on chronic plavix  . Hyperplastic colon polyp 12/2010    Per colonoscopy (12/2010) - Dr. Deatra Ina  . Asthma   . COPD 01/08/2007    PFT's 05/2007 : FEV1/FVC 82, FEV1 64% pred, FEF 25-75% 40% predicted, 16% improvement in FEV1 with bronchodilators.     . Tobacco abuse   . Chronic osteomyelitis of foot     chronic, right secondary to diabetic foot ulcers  . Polymicrobial bacterial infection 01/2013    GBS and S. aureus bacteremia // Source likely infected diabetic foot ulcer  . 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 Blood cultures 01/26/13 S. Aureus and GBS. Blood cultures Feb 6th, 8th, and 9th and March were negative.Repeat TEE 3/20 negative for vegitations  . 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 Chronic diastolic heart failure     grade 2 per 2D echocardiogram (01/2013)  . Ulcer of foot, chronic     Left. No OM per MRI (01/2013)  . DVT of upper extremity (deep vein thrombosis) 03/11/2013    Secondary to PICC line. Right brachial vein, diagnosed on 03/10/2013 Coumadin for 3 months. End date 06/10/2013   . Chronic pain syndrome 12/03/2011    Likely secondary to depression, "fibromyalgia", neuropathy, and obesity. Lumbar MRI 2014 no sig change from prior (2008) :  Stable hypertrophic facet disease most notable at L4-5. Stable shallow left foraminal/extraforaminal disc protrusion at L4-5. No direct neural compression.      . Environmental allergies     Hx: of  . Fatty liver 2003    observed on ultrasound abdomen  . CHF (congestive heart failure)   . Anginal pain     '3' of 10 ischemia ruled out 9/9   . Fibromyalgia   . Chronic bronchitis     "I get it alot" (09/28/2013)  . Exertional shortness of breath   . Rheumatoid arthritis   . Arthritis of lumbar spine   . Chronic lower back pain   . Diabetic peripheral neuropathy   . Type II diabetes mellitus with peripheral circulatory disorders, uncontrolled DX: 1993    Insulin dep. Poor control. Complicated by diabetic foot ulcer and diabetic eye disease.    . 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 2/2 tobacco use, DM, and cont weight bearing on surgical wound and developed gangrene     Past Surgical History  Procedure Laterality Date  . Abdominal hysterectomy  1997    secondary to uterine fibroids  . Tubal ligation    . Shoulder arthroscopy w/ rotator cuff repair Bilateral   . Breast  biopsy      multiple-benign per pt  . Wrist surgery Right     "for tumors" (09/28/2013)  . Ganglion cyst excision      multiple  . Other surgical history      stents in lower ext  . Tee without cardioversion N/A 01/31/2013    Procedure: TRANSESOPHAGEAL ECHOCARDIOGRAM (TEE);  Surgeon: Fay Records, MD;  Location: [place];  Service: Cardiovascular;  Laterality: N/A;  Rm [phone]  . Bladder surgery      bladder reconstruction surgery  . Tee without cardioversion N/A 03/10/2013    Procedure: TRANSESOPHAGEAL ECHOCARDIOGRAM (TEE);  Surgeon: Larey Dresser, MD;  Location: [place];  Service: Cardiovascular;  Laterality: N/A;  Rm. [phone]  . Skin split graft Bilateral 05/13/2013    Procedure: Right and Left Foot Allograft Skin Graft;  Surgeon: Newt Minion, MD;  Location: [place];  Service: Orthopedics;  Laterality: Bilateral;  Right and Left Foot Allograft Skin Graft  . Toe amputation Left 08/31/2013    4TH & 5 TH TOE   . Amputation Left 08/31/2013    Procedure: AMPUTATION RAY;  Surgeon: Newt Minion, MD;  Location: [place];  Service: Orthopedics;  Laterality: Left;  Left Foot 5th Ray Amputation  . Esophagogastroduodenoscopy N/A 09/20/2013    Procedure: ESOPHAGOGASTRODUODENOSCOPY (EGD);  Surgeon: Jerene Bears, MD;  Location: [place];  Service: Gastroenterology;  Laterality: N/A;  . Foot amputation through metatarsal Left 09/28/2013  . Tonsillectomy    . Amputation Left 09/28/2013    Procedure: Left Midfoot amputation;  Surgeon: Newt Minion, MD;  Location: [place];  Service: Orthopedics;  Laterality: Left;  Left Midfoot amputation  . Amputation Left 10/14/2013    Procedure: AMPUTATION BELOW KNEE- left;  Surgeon: Newt Minion, MD;  Location: [place];  Service: Orthopedics;  Laterality: Left;  Left Below Knee Amputation     There were no vitals taken for this visit.  Visit Diagnosis:  Abnormality of gait  Activity intolerance  Status post below knee amputation of left lower extremity          [facility] Adult PT Treatment/Exercise - 10/26/14 1055    Ambulation/Gait   Ambulation/Gait Yes   Ambulation/Gait Assistance 4: Min assist   Ambulation/Gait Assistance Details --  cues on sequence, posture & step length   Ambulation Distance (Feet) --  46', 37', 60' & 92' with cane, 100' with walker plus barrier   Assistive device Rolling walker;Straight cane  single point cane household mobility & rolling walker commun   Gait Pattern Step-to pattern;Decreased step length - right;Decreased stance time - left;Trunk flexed              PT Long Term Goals - 10/26/14 1044    PT LONG TERM GOAL #1   Title demonstrate & verbalize proper prosthetic care to enable safe utilization of the prosthesis.   Time --  11/16/14   Status On-going    PT LONG TERM GOAL #2   Title tolerate wear of prosthesis for >90% of awake hours without change in skin integrity nor undue tenderness.   Time --  11/16/14   Status On-going   PT LONG TERM GOAL #3   Title Ambulate 347ft with rolling walker & prosthesis modified independent.   Time --  11/16/14   Status On-going   PT LONG TERM GOAL #4   Title ambulate 11' around household furniture carrying cup or plate with single point cane & prosthesis modified independent.   Time --  11/16/14   Status On-going   PT LONG TERM GOAL #5   Title negotiate ramp, curb, stairs (1 rail) with rolling walker & prosthesis modified independent.   Time --  11/16/14   Status On-going   Additional Long Term Goals   Additional Long Term Goals Yes   PT LONG TERM GOAL #6   Title perform standing activities for > 10 minutes & prosthesis with limb /back pain increasing <2 increments on 0-10 scale.   Time --  11/16/14   Status On-going   PT LONG TERM GOAL #7   Title Berg Balance Test score with & prosthesis >/= 40/56   Time --  11/16/14   Status On-going          Problem List Patient Active Problem List   Diagnosis Date Noted  . Anemia 10/05/2014  . Chronic diastolic heart failure   . S/P BKA (below knee amputation) unilateral   . Tobacco abuse   . Severe obesity (BMI >= 40) 03/02/2013  . Abnormality of gait 03/01/2013  . Infective endocarditis 01/22/2013  . Healthcare maintenance 07/10/2012  . Chronic pain syndrome 12/03/2011  . PVD in DM 08/27/2011  . GLAUCOMA 11/29/2009  . Benign essential HTN 11/29/2009  . INSOMNIA 10/25/2009  . GASTROESOPHAGEAL REFLUX DISEASE 11/24/2008  . DEPRESSION 04/06/2008  . DM (diabetes mellitus) type II uncontrolled, periph vascular disorder 04/02/2007  . Hyperlipidemia 01/08/2007  . COPD 01/08/2007                                            Per Beagley 10/26/2014, 12:21 PM

## 2014-10-28 NOTE — Addendum Note (Signed)
Addended by: Jones Bales on: 10/28/2014 12:01 AM   Modules accepted: Level of Service

## 2014-10-31 ENCOUNTER — Encounter: Payer: Self-pay | Admitting: *Deleted

## 2014-11-01 ENCOUNTER — Other Ambulatory Visit: Payer: Self-pay | Admitting: Internal Medicine

## 2014-11-01 ENCOUNTER — Encounter: Payer: Self-pay | Admitting: Physical Therapy

## 2014-11-01 ENCOUNTER — Ambulatory Visit: Payer: Medicare Other | Admitting: Physical Therapy

## 2014-11-01 DIAGNOSIS — R269 Unspecified abnormalities of gait and mobility: Secondary | ICD-10-CM

## 2014-11-01 DIAGNOSIS — Z89512 Acquired absence of left leg below knee: Secondary | ICD-10-CM

## 2014-11-01 DIAGNOSIS — R6889 Other general symptoms and signs: Secondary | ICD-10-CM

## 2014-11-01 NOTE — Therapy (Signed)
Physical Therapy Treatment  Patient Details  Name: Jennifer [name] MRN: [id number] Date of Birth: Oct 09, 1961  Encounter Date: 11/01/2014      PT End of Session - 11/01/14 1325    Visit Number 18   Number of Visits 24   Date for PT Re-Evaluation 11/16/14   PT Start Time 1320   PT Stop Time 1400   PT Time Calculation (min) 40 min   Equipment Utilized During Treatment Gait belt   Activity Tolerance Patient tolerated treatment well      Past Medical History  Diagnosis Date  . Depression   . GERD (gastroesophageal reflux disease)   . Hyperlipidemia   . Hypertension   . Glaucoma   . PVD (peripheral vascular disease) with claudication     Stents to bilateral common iliac arteries (left 2005, right 2008), on chronic plavix  . Hyperplastic colon polyp 12/2010    Per colonoscopy (12/2010) - Dr. Deatra Ina  . Asthma   . COPD 01/08/2007    PFT's 05/2007 : FEV1/FVC 82, FEV1 64% pred, FEF 25-75% 40% predicted, 16% improvement in FEV1 with bronchodilators.     . Tobacco abuse   . Chronic osteomyelitis of foot     chronic, right secondary to diabetic foot ulcers  . Polymicrobial bacterial infection 01/2013    GBS and S. aureus bacteremia // Source likely infected diabetic foot ulcer  . 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 Blood cultures 01/26/13 S. Aureus and GBS. Blood cultures Feb 6th, 8th, and 9th and March were negative.Repeat TEE 3/20 negative for vegitations  . 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 Chronic diastolic heart failure     grade 2 per 2D echocardiogram (01/2013)  . Ulcer of foot, chronic     Left. No OM per MRI (01/2013)  . DVT of upper extremity (deep vein thrombosis) 03/11/2013    Secondary to PICC line. Right brachial vein, diagnosed on 03/10/2013 Coumadin for 3 months. End date 06/10/2013   . Chronic pain syndrome 12/03/2011    Likely secondary to depression, "fibromyalgia", neuropathy, and  obesity. Lumbar MRI 2014 no sig change from prior (2008) : Stable hypertrophic facet disease most notable at L4-5. Stable shallow left foraminal/extraforaminal disc protrusion at L4-5. No direct neural compression.      . Environmental allergies     Hx: of  . Fatty liver 2003    observed on ultrasound abdomen  . CHF (congestive heart failure)   . Anginal pain     '3' of 10 ischemia ruled out 9/9   . Fibromyalgia   . Chronic bronchitis     "I get it alot" (09/28/2013)  . Exertional shortness of breath   . Rheumatoid arthritis   . Arthritis of lumbar spine   . Chronic lower back pain   . Diabetic peripheral neuropathy   . Type II diabetes mellitus with peripheral circulatory disorders, uncontrolled DX: 1993    Insulin dep. Poor control. Complicated by diabetic foot ulcer and diabetic eye disease.    . 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 2/2 tobacco use, DM, and cont weight bearing on surgical wound and developed gangrene     Past Surgical History  Procedure Laterality Date  . Abdominal hysterectomy  1997    secondary to uterine fibroids  . Tubal ligation    . Shoulder arthroscopy w/  rotator cuff repair Bilateral   . Breast biopsy      multiple-benign per pt  . Wrist surgery Right     "for tumors" (09/28/2013)  . Ganglion cyst excision      multiple  . Other surgical history      stents in lower ext  . Tee without cardioversion N/A 01/31/2013    Procedure: TRANSESOPHAGEAL ECHOCARDIOGRAM (TEE);  Surgeon: Fay Records, MD;  Location: [place];  Service: Cardiovascular;  Laterality: N/A;  Rm [phone]  . Bladder surgery      bladder reconstruction surgery  . Tee without cardioversion N/A 03/10/2013    Procedure: TRANSESOPHAGEAL ECHOCARDIOGRAM (TEE);  Surgeon: Larey Dresser, MD;  Location: [place];  Service: Cardiovascular;  Laterality: N/A;  Rm. [phone]  . Skin split graft Bilateral 05/13/2013    Procedure:  Right and Left Foot Allograft Skin Graft;  Surgeon: Newt Minion, MD;  Location: [place];  Service: Orthopedics;  Laterality: Bilateral;  Right and Left Foot Allograft Skin Graft  . Toe amputation Left 08/31/2013    4TH & 5 TH TOE   . Amputation Left 08/31/2013    Procedure: AMPUTATION RAY;  Surgeon: Newt Minion, MD;  Location: [place];  Service: Orthopedics;  Laterality: Left;  Left Foot 5th Ray Amputation  . Esophagogastroduodenoscopy N/A 09/20/2013    Procedure: ESOPHAGOGASTRODUODENOSCOPY (EGD);  Surgeon: Jerene Bears, MD;  Location: [place];  Service: Gastroenterology;  Laterality: N/A;  . Foot amputation through metatarsal Left 09/28/2013  . Tonsillectomy    . Amputation Left 09/28/2013    Procedure: Left Midfoot amputation;  Surgeon: Newt Minion, MD;  Location: [place];  Service: Orthopedics;  Laterality: Left;  Left Midfoot amputation  . Amputation Left 10/14/2013    Procedure: AMPUTATION BELOW KNEE- left;  Surgeon: Newt Minion, MD;  Location: [place];  Service: Orthopedics;  Laterality: Left;  Left Below Knee Amputation     There were no vitals taken for this visit.  Visit Diagnosis:  Abnormality of gait  Activity intolerance  Status post below knee amputation of left lower extremity      Subjective Assessment - 11/01/14 1329    Symptoms No new complaints.  No falls to report. Walking at home with cane indoors some and walker both indoors and outdoors.   Currently in Pain? Yes   Pain Score 6    Pain Location Back   Pain Orientation Mid;Lower   Pain Descriptors / Indicators Aching;Sore;Constant;Sharp   Pain Type Chronic pain   Pain Onset More than a month ago   Pain Frequency Constant   Pain Relieving Factors Medications, rest, and some exercise. Increased standing and walking as well.   Effect of Pain on Daily Activities Needs frequent rest. Limits most activities.            [facility] Adult PT Treatment/Exercise - 11/01/14 1332    Transfers   Transfers Sit to  Stand;Stand to Sit   Sit to Stand 4: Min guard;With upper extremity assist;From chair/3-in-1;With armrests   Sit to Stand Details (indicate cue type and reason) cues on ant weight shift and to scoot fwd to assist with standing   Stand to Sit 4: Min guard;With upper extremity assist;With armrests;To chair/3-in-1   Stand to Sit Details cues to back all the way to the surface before sitting down and to reach back   Ambulation/Gait   Ambulation/Gait Yes   Ambulation/Gait Assistance 4: Min assist;6: Modified independent (Device/Increase time)   Ambulation/Gait Assistance  Details Mod I with RW; min assist with cane with cues on posture, step length on right and to incr stride length.                     Ambulation Distance (Feet) --  220 feet with RW before needed rest; 70 x1, 58x1 with cane   Assistive device Rolling walker;Straight cane   Gait Pattern Step-through pattern;Step-to pattern;Decreased step length - right;Decreased stride length;Narrow base of support;Trunk flexed   Gait velocity decreased   Stairs Yes   Stairs Assistance 5: Supervision   Stairs Assistance Details (indicate cue type and reason) cues on posture with stairs   Stair Management Technique One rail Right;Step to pattern;Forwards   Number of Stairs 4   Ramp 5: Supervision   Ramp Details (indicate cue type and reason) with RW/prosthesis    Curb 5: Supervision   Curb Details (indicate cue type and reason) with RW and prosthesis   Prosthetics   Current prosthetic wear tolerance (days/week)  7   Current prosthetic wear tolerance (#hours/day)  all awake hours  removing to dry as needed   Residual limb condition  intact per pt without skin issues   Donning Prosthesis Modified independent (device/increased time)   Doffing Prosthesis Modified independent (device/increased time)              PT Long Term Goals - 11/01/14 1413    PT LONG TERM GOAL #1   Title demonstrate & verbalize proper prosthetic care to enable  safe utilization of the prosthesis.   Baseline 11/16/2014   Status On-going   PT LONG TERM GOAL #2   Title tolerate wear of prosthesis for >90% of awake hours without change in skin integrity nor undue tenderness.   Baseline 11/16/2014   Status On-going   PT LONG TERM GOAL #3   Title Ambulate 326ft with rolling walker & prosthesis modified independent.   Baseline 11/16/2014   Status On-going   PT LONG TERM GOAL #4   Title ambulate 62' around household furniture carrying cup or plate with single point cane & prosthesis modified independent.   Baseline 11/16/2014   Status On-going   PT LONG TERM GOAL #5   Title negotiate ramp, curb, stairs (1 rail) with rolling walker & prosthesis modified independent.   Baseline 11/16/2014   Status On-going   PT LONG TERM GOAL #6   Title perform standing activities for > 10 minutes & prosthesis with limb /back pain increasing <2 increments on 0-10 scale.   Baseline 11/16/2014   Status On-going   PT LONG TERM GOAL #7   Title Berg Balance Test score with & prosthesis >/= 40/56   Baseline 11/16/2014   Status On-going          Plan - 11/01/14 1409    Clinical Impression Statement Pt progressing with mobility toward her LTG's.   Pt will benefit from skilled therapeutic intervention in order to improve on the following deficits Abnormal gait;Decreased coordination;Difficulty walking;Decreased activity tolerance;Decreased balance;Decreased knowledge of use of DME;Decreased mobility;Decreased strength   Rehab Potential Good   PT Frequency 2x / week   PT Duration 8 weeks   PT Treatment/Interventions Therapeutic activities;Neuromuscular re-education;Manual techniques;Gait training;Patient/family education;Therapeutic exercise;ADLs/Self Care Home Management;Other (comment)  HEP,  prosthetic training, modalities   PT Next Visit Plan continue toward LTG's, begin assessing LTG's for end of current plan of care.        Problem List Patient Active  Problem List   Diagnosis Date Noted  .  Anemia 10/05/2014  . Chronic diastolic heart failure   . S/P BKA (below knee amputation) unilateral   . Tobacco abuse   . Severe obesity (BMI >= 40) 03/02/2013  . Abnormality of gait 03/01/2013  . Infective endocarditis 01/22/2013  . Healthcare maintenance 07/10/2012  . Chronic pain syndrome 12/03/2011  . PVD in DM 08/27/2011  . GLAUCOMA 11/29/2009  . Benign essential HTN 11/29/2009  . INSOMNIA 10/25/2009  . GASTROESOPHAGEAL REFLUX DISEASE 11/24/2008  . DEPRESSION 04/06/2008  . DM (diabetes mellitus) type II uncontrolled, periph vascular disorder 04/02/2007  . Hyperlipidemia 01/08/2007  . COPD 01/08/2007       Willow Ora, PTA, CLT Office- [phone]                                        Willow Ora 11/01/2014, 2:15 PM

## 2014-11-03 ENCOUNTER — Encounter: Payer: Self-pay | Admitting: Physical Therapy

## 2014-11-03 ENCOUNTER — Ambulatory Visit: Payer: Medicare Other | Admitting: Physical Therapy

## 2014-11-03 DIAGNOSIS — R269 Unspecified abnormalities of gait and mobility: Secondary | ICD-10-CM | POA: Diagnosis not present

## 2014-11-03 DIAGNOSIS — R6889 Other general symptoms and signs: Secondary | ICD-10-CM

## 2014-11-03 DIAGNOSIS — Z89512 Acquired absence of left leg below knee: Secondary | ICD-10-CM

## 2014-11-03 NOTE — Therapy (Signed)
Physical Therapy Treatment  Patient Details  Name: Jennifer [name] MRN: [id number] Date of Birth: 05-Dec-1961  Encounter Date: 11/03/2014      PT End of Session - 11/03/14 1327    Visit Number 19   Number of Visits 24   Date for PT Re-Evaluation 11/16/14   PT Start Time 1230   PT Stop Time 1317   PT Time Calculation (min) 47 min   Equipment Utilized During Treatment Gait belt   Activity Tolerance Patient tolerated treatment well   Behavior During Therapy [facility] for tasks assessed/performed      Past Medical History  Diagnosis Date  . Depression   . GERD (gastroesophageal reflux disease)   . Hyperlipidemia   . Hypertension   . Glaucoma   . PVD (peripheral vascular disease) with claudication     Stents to bilateral common iliac arteries (left 2005, right 2008), on chronic plavix  . Hyperplastic colon polyp 12/2010    Per colonoscopy (12/2010) - Dr. Deatra Ina  . Asthma   . COPD 01/08/2007    PFT's 05/2007 : FEV1/FVC 82, FEV1 64% pred, FEF 25-75% 40% predicted, 16% improvement in FEV1 with bronchodilators.     . Tobacco abuse   . Chronic osteomyelitis of foot     chronic, right secondary to diabetic foot ulcers  . Polymicrobial bacterial infection 01/2013    GBS and S. aureus bacteremia // Source likely infected diabetic foot ulcer  . 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 Blood cultures 01/26/13 S. Aureus and GBS. Blood cultures Feb 6th, 8th, and 9th and March were negative.Repeat TEE 3/20 negative for vegitations  . 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 Chronic diastolic heart failure     grade 2 per 2D echocardiogram (01/2013)  . Ulcer of foot, chronic     Left. No OM per MRI (01/2013)  . DVT of upper extremity (deep vein thrombosis) 03/11/2013    Secondary to PICC line. Right brachial vein, diagnosed on 03/10/2013 Coumadin for 3 months. End date 06/10/2013   . Chronic pain syndrome 12/03/2011    Likely  secondary to depression, "fibromyalgia", neuropathy, and obesity. Lumbar MRI 2014 no sig change from prior (2008) : Stable hypertrophic facet disease most notable at L4-5. Stable shallow left foraminal/extraforaminal disc protrusion at L4-5. No direct neural compression.      . Environmental allergies     Hx: of  . Fatty liver 2003    observed on ultrasound abdomen  . CHF (congestive heart failure)   . Anginal pain     '3' of 10 ischemia ruled out 9/9   . Fibromyalgia   . Chronic bronchitis     "I get it alot" (09/28/2013)  . Exertional shortness of breath   . Rheumatoid arthritis   . Arthritis of lumbar spine   . Chronic lower back pain   . Diabetic peripheral neuropathy   . Type II diabetes mellitus with peripheral circulatory disorders, uncontrolled DX: 1993    Insulin dep. Poor control. Complicated by diabetic foot ulcer and diabetic eye disease.    . 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 2/2 tobacco use, DM, and cont weight bearing on surgical wound and developed gangrene     Past Surgical History  Procedure Laterality Date  . Abdominal hysterectomy  1997    secondary to uterine fibroids  .  Tubal ligation    . Shoulder arthroscopy w/ rotator cuff repair Bilateral   . Breast biopsy      multiple-benign per pt  . Wrist surgery Right     "for tumors" (09/28/2013)  . Ganglion cyst excision      multiple  . Other surgical history      stents in lower ext  . Tee without cardioversion N/A 01/31/2013    Procedure: TRANSESOPHAGEAL ECHOCARDIOGRAM (TEE);  Surgeon: Fay Records, MD;  Location: [place];  Service: Cardiovascular;  Laterality: N/A;  Rm [phone]  . Bladder surgery      bladder reconstruction surgery  . Tee without cardioversion N/A 03/10/2013    Procedure: TRANSESOPHAGEAL ECHOCARDIOGRAM (TEE);  Surgeon: Larey Dresser, MD;  Location: [place];  Service: Cardiovascular;  Laterality: N/A;  Rm. [phone]   . Skin split graft Bilateral 05/13/2013    Procedure: Right and Left Foot Allograft Skin Graft;  Surgeon: Newt Minion, MD;  Location: [place];  Service: Orthopedics;  Laterality: Bilateral;  Right and Left Foot Allograft Skin Graft  . Toe amputation Left 08/31/2013    4TH & 5 TH TOE   . Amputation Left 08/31/2013    Procedure: AMPUTATION RAY;  Surgeon: Newt Minion, MD;  Location: [place];  Service: Orthopedics;  Laterality: Left;  Left Foot 5th Ray Amputation  . Esophagogastroduodenoscopy N/A 09/20/2013    Procedure: ESOPHAGOGASTRODUODENOSCOPY (EGD);  Surgeon: Jerene Bears, MD;  Location: [place];  Service: Gastroenterology;  Laterality: N/A;  . Foot amputation through metatarsal Left 09/28/2013  . Tonsillectomy    . Amputation Left 09/28/2013    Procedure: Left Midfoot amputation;  Surgeon: Newt Minion, MD;  Location: [place];  Service: Orthopedics;  Laterality: Left;  Left Midfoot amputation  . Amputation Left 10/14/2013    Procedure: AMPUTATION BELOW KNEE- left;  Surgeon: Newt Minion, MD;  Location: [place];  Service: Orthopedics;  Laterality: Left;  Left Below Knee Amputation     There were no vitals taken for this visit.  Visit Diagnosis:  Abnormality of gait  Activity intolerance  Status post below knee amputation of left lower extremity      Subjective Assessment - 11/03/14 1236    Symptoms No new complaints, no new falls. Walking inside with cane or walker depending on pain level. Not walking outside.   Limitations Standing;Walking   Currently in Pain? Yes   Pain Score 7    Pain Location Back   Pain Descriptors / Indicators Aching   Pain Type Chronic pain   Pain Onset More than a month ago   Pain Frequency Constant   Aggravating Factors  Standing, walking   Pain Relieving Factors Rest, medication            [facility] Adult PT Treatment/Exercise - 11/03/14 1230    Transfers   Transfers Sit to Stand;Stand to Sit   Sit to Stand 5: Supervision   Stand to Sit 5:  Supervision   Ambulation/Gait   Ambulation/Gait Yes   Ambulation/Gait Assistance 6: Modified independent (Device/Increase time);4: Min assist  modI amb w/ RW; min asst w/ single point cane   Ambulation/Gait Assistance Details cues on posture pacing w/ ambulation   Ambulation Distance (Feet) 212 Feet  w/ RW; 212 w/ RW; 120 x2 w/ RW; 102 w/ single point cane   Assistive device Rolling walker;Straight cane;Other (Comment)  LLE below knee prosthesis   Gait Pattern Step-through pattern;Decreased step length - right;Decreased stride length;Narrow base of support;Trunk  flexed   Gait velocity decreased   Stairs Yes   Stairs Assistance 5: Supervision   Stairs Assistance Details (indicate cue type and reason) cues on posture and sequence   Stair Management Technique One rail Right;Forwards;Step to pattern   Number of Stairs 4  x1; 4 steps x3 w/ out rest to simulate niece's home   Ramp 5: Supervision   Ramp Details (indicate cue type and reason) with RW/prosthesis   Curb 5: Supervision   Curb Details (indicate cue type and reason) with RW and prosthesis   Prosthetics   Current prosthetic wear tolerance (days/week)  7   Current prosthetic wear tolerance (#hours/day)  all awake hours   Residual limb condition  per pt. no skin changes          PT Education - 11/03/14 1325    Education provided Yes   Education Details pacing during activity, increasing amount of small exercise ambulating in home.    Person(s) Educated Patient   Methods Explanation   Comprehension Verbalized understanding              Plan - 11/03/14 1329    Clinical Impression Statement Pt demonstrated increased overall activity tolerance, but still unable to increase ambulation distance to >300 ft without rest break due to back pain.    Pt will benefit from skilled therapeutic intervention in order to improve on the following deficits Abnormal gait;Decreased coordination;Difficulty walking;Decreased activity  tolerance;Decreased balance;Decreased knowledge of use of DME;Decreased mobility;Decreased strength   Rehab Potential Good   PT Frequency 2x / week   PT Duration 8 weeks   PT Treatment/Interventions Therapeutic activities;Neuromuscular re-education;Manual techniques;Gait training;Patient/family education;Therapeutic exercise;ADLs/Self Care Home Management;Other (comment)   PT Next Visit Plan DO G CODE; assess progress toward long term goals to start determining if patient would benefit from renewal.    PT Home Exercise Plan Pt instructed to continue her core exercises in addition to ambulation around home.    Consulted and Agree with Plan of Care Patient        Problem List Patient Active Problem List   Diagnosis Date Noted  . Anemia 10/05/2014  . Chronic diastolic heart failure   . S/P BKA (below knee amputation) unilateral   . Tobacco abuse   . Severe obesity (BMI >= 40) 03/02/2013  . Abnormality of gait 03/01/2013  . Infective endocarditis 01/22/2013  . Healthcare maintenance 07/10/2012  . Chronic pain syndrome 12/03/2011  . PVD in DM 08/27/2011  . GLAUCOMA 11/29/2009  . Benign essential HTN 11/29/2009  . INSOMNIA 10/25/2009  . GASTROESOPHAGEAL REFLUX DISEASE 11/24/2008  . DEPRESSION 04/06/2008  . DM (diabetes mellitus) type II uncontrolled, periph vascular disorder 04/02/2007  . Hyperlipidemia 01/08/2007  . COPD 01/08/2007       Blima Rich, Student PT 11/03/2014, 1:32 PM

## 2014-11-06 ENCOUNTER — Ambulatory Visit: Payer: Medicare Other | Admitting: Physical Therapy

## 2014-11-06 ENCOUNTER — Encounter: Payer: Self-pay | Admitting: Physical Therapy

## 2014-11-06 DIAGNOSIS — Z89512 Acquired absence of left leg below knee: Secondary | ICD-10-CM

## 2014-11-06 DIAGNOSIS — R269 Unspecified abnormalities of gait and mobility: Secondary | ICD-10-CM | POA: Diagnosis not present

## 2014-11-06 DIAGNOSIS — R6889 Other general symptoms and signs: Secondary | ICD-10-CM

## 2014-11-06 NOTE — Therapy (Addendum)
Physical Therapy Treatment  Patient Details  Name: Jennifer [name] MRN: [id number] Date of Birth: 1961/04/15  Encounter Date: 11/06/2014      PT End of Session - 11/06/14 1506    Visit Number 20   Number of Visits 24   Date for PT Re-Evaluation 11/16/14   PT Start Time 1404   PT Stop Time 1448   PT Time Calculation (min) 44 min   Equipment Utilized During Treatment Gait belt   Activity Tolerance Patient tolerated treatment well;Patient limited by fatigue   Behavior During Therapy [facility] for tasks assessed/performed      Past Medical History  Diagnosis Date  . Depression   . GERD (gastroesophageal reflux disease)   . Hyperlipidemia   . Hypertension   . Glaucoma   . PVD (peripheral vascular disease) with claudication     Stents to bilateral common iliac arteries (left 2005, right 2008), on chronic plavix  . Hyperplastic colon polyp 12/2010    Per colonoscopy (12/2010) - Dr. Deatra Ina  . Asthma   . COPD 01/08/2007    PFT's 05/2007 : FEV1/FVC 82, FEV1 64% pred, FEF 25-75% 40% predicted, 16% improvement in FEV1 with bronchodilators.     . Tobacco abuse   . Chronic osteomyelitis of foot     chronic, right secondary to diabetic foot ulcers  . Polymicrobial bacterial infection 01/2013    GBS and S. aureus bacteremia // Source likely infected diabetic foot ulcer  . 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 Blood cultures 01/26/13 S. Aureus and GBS. Blood cultures Feb 6th, 8th, and 9th and March were negative.Repeat TEE 3/20 negative for vegitations  . 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 Chronic diastolic heart failure     grade 2 per 2D echocardiogram (01/2013)  . Ulcer of foot, chronic     Left. No OM per MRI (01/2013)  . DVT of upper extremity (deep vein thrombosis) 03/11/2013    Secondary to PICC line. Right brachial vein, diagnosed on 03/10/2013 Coumadin for 3 months. End date 06/10/2013   . Chronic pain  syndrome 12/03/2011    Likely secondary to depression, "fibromyalgia", neuropathy, and obesity. Lumbar MRI 2014 no sig change from prior (2008) : Stable hypertrophic facet disease most notable at L4-5. Stable shallow left foraminal/extraforaminal disc protrusion at L4-5. No direct neural compression.      . Environmental allergies     Hx: of  . Fatty liver 2003    observed on ultrasound abdomen  . CHF (congestive heart failure)   . Anginal pain     '3' of 10 ischemia ruled out 9/9   . Fibromyalgia   . Chronic bronchitis     "I get it alot" (09/28/2013)  . Exertional shortness of breath   . Rheumatoid arthritis   . Arthritis of lumbar spine   . Chronic lower back pain   . Diabetic peripheral neuropathy   . Type II diabetes mellitus with peripheral circulatory disorders, uncontrolled DX: 1993    Insulin dep. Poor control. Complicated by diabetic foot ulcer and diabetic eye disease.    . 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 2/2 tobacco use, DM, and cont weight bearing on surgical wound and developed gangrene     Past Surgical History  Procedure Laterality Date  . Abdominal hysterectomy  1997    secondary to uterine  fibroids  . Tubal ligation    . Shoulder arthroscopy w/ rotator cuff repair Bilateral   . Breast biopsy      multiple-benign per pt  . Wrist surgery Right     "for tumors" (09/28/2013)  . Ganglion cyst excision      multiple  . Other surgical history      stents in lower ext  . Tee without cardioversion N/A 01/31/2013    Procedure: TRANSESOPHAGEAL ECHOCARDIOGRAM (TEE);  Surgeon: Fay Records, MD;  Location: [place];  Service: Cardiovascular;  Laterality: N/A;  Rm [phone]  . Bladder surgery      bladder reconstruction surgery  . Tee without cardioversion N/A 03/10/2013    Procedure: TRANSESOPHAGEAL ECHOCARDIOGRAM (TEE);  Surgeon: Larey Dresser, MD;  Location: [place];  Service:  Cardiovascular;  Laterality: N/A;  Rm. 4730  . Skin split graft Bilateral 05/13/2013    Procedure: Right and Left Foot Allograft Skin Graft;  Surgeon: Newt Minion, MD;  Location: [place];  Service: Orthopedics;  Laterality: Bilateral;  Right and Left Foot Allograft Skin Graft  . Toe amputation Left 08/31/2013    4TH & 5 TH TOE   . Amputation Left 08/31/2013    Procedure: AMPUTATION RAY;  Surgeon: Newt Minion, MD;  Location: ;  Service: Orthopedics;  Laterality: Left;  Left Foot 5th Ray Amputation  . Esophagogastroduodenoscopy N/A 09/20/2013    Procedure: ESOPHAGOGASTRODUODENOSCOPY (EGD);  Surgeon: Jerene Bears, MD;  Location: [place];  Service: Gastroenterology;  Laterality: N/A;  . Foot amputation through metatarsal Left 09/28/2013  . Tonsillectomy    . Amputation Left 09/28/2013    Procedure: Left Midfoot amputation;  Surgeon: Newt Minion, MD;  Location: [place];  Service: Orthopedics;  Laterality: Left;  Left Midfoot amputation  . Amputation Left 10/14/2013    Procedure: AMPUTATION BELOW KNEE- left;  Surgeon: Newt Minion, MD;  Location: [place];  Service: Orthopedics;  Laterality: Left;  Left Below Knee Amputation     There were no vitals taken for this visit.  Visit Diagnosis:  Abnormality of gait  Activity intolerance  Status post below knee amputation of left lower extremity      Subjective Assessment - 11/06/14 1416    Symptoms No new complaints, no falls, reports performing HEP 2-3x/day. Walking with cane/walker inside.    Limitations Standing;Walking   Pain Score 7    Pain Orientation Mid;Lower   Pain Onset More than a month ago   Pain Frequency Constant   Aggravating Factors  standing, walking   Pain Relieving Factors rest, medicaiton   Effect of Pain on Daily Activities decreased activity participation due to pain.   Multiple Pain Sites No            [facility] Adult PT Treatment/Exercise - 11/06/14 1430    Transfers   Transfers Sit to Stand;Stand to Sit    Sit to Stand 5: Supervision;With upper extremity assist;With armrests;From chair/3-in-1   Sit to Stand Details (indicate cue type and reason) cues on posture   Stand to Sit 5: Supervision   Stand to Sit Details performed with no UE support   Ambulation/Gait   Ambulation/Gait Yes   Ambulation/Gait Assistance 6: Modified independent (Device/Increase time);4: Min guard  Mod I w/ RW; min guard w/ single point cane   Ambulation/Gait Assistance Details cues on posture, sequence w/ cane and plate or drink in other UE   Ambulation Distance (Feet) 207 Feet  w/ RW; 104' drink/cane, 102' plate/cane,  108 cane   Assistive device Rolling walker;Straight cane  LLE below knee prosthesis   Gait Pattern Step-through pattern;Decreased step length - right;Narrow base of support;Trunk flexed;Decreased stance time - left   Gait velocity decreased   Prosthetics   Current prosthetic wear tolerance (days/week)  7   Current prosthetic wear tolerance (#hours/day)  all awake hours   Residual limb condition  per patient, no skin changes          PT Education - 11/28/14 1506    Education provided Yes   Education Details Pacing, decreasing activity time while increasing frequency to increase endurance   Person(s) Educated Patient   Methods Explanation;Demonstration   Comprehension Verbalized understanding;Need further instruction              Plan - 11-28-2014 1508    Clinical Impression Statement Patient demonstrated increased ambulation tolerance with 3-5 minute rest breaks. Pt states she is most limited by back pain, but while resting appears out of breath and fatigued.   Pt will benefit from skilled therapeutic intervention in order to improve on the following deficits Abnormal gait;Decreased coordination;Difficulty walking;Decreased activity tolerance;Decreased balance;Decreased knowledge of use of DME;Decreased mobility;Decreased strength   Rehab Potential Good   PT Frequency 2x / week   PT Duration  8 weeks   PT Treatment/Interventions Therapeutic activities;Neuromuscular re-education;Manual techniques;Gait training;Patient/family education;Therapeutic exercise;ADLs/Self Care Home Management;Other (comment)  prosthetic training   PT Next Visit Plan Assess progress toward long term goals, review HEP independence if pt brings HEP to PT session.    PT Home Exercise Plan Pt encouraged to increase small amounts of walking during commercial breaks to increase activity tolerance.    Consulted and Agree with Plan of Care Patient          G-Codes - 2014-11-28 1545    Functional Assessment Tool Used Wears all awake hours 7 days per week; requires cues for sock management    Functional Limitation Self care   Self Care Current Status [phone]) At least 20 percent but less than 40 percent impaired, limited or restricted   Self Care Goal Status ([id number]) At least 1 percent but less than 20 percent impaired, limited or restricted      Problem List Patient Active Problem List   Diagnosis Date Noted  . Anemia 10/05/2014  . Chronic diastolic heart failure   . S/P BKA (below knee amputation) unilateral   . Tobacco abuse   . Severe obesity (BMI >= 40) 03/02/2013  . Abnormality of gait 03/01/2013  . Infective endocarditis 01/22/2013  . Healthcare maintenance 07/10/2012  . Chronic pain syndrome 12/03/2011  . PVD in DM 08/27/2011  . GLAUCOMA 11/29/2009  . Benign essential HTN 11/29/2009  . INSOMNIA 10/25/2009  . GASTROESOPHAGEAL REFLUX DISEASE 11/24/2008  . DEPRESSION 04/06/2008  . DM (diabetes mellitus) type II uncontrolled, periph vascular disorder 04/02/2007  . Hyperlipidemia 01/08/2007  . COPD 01/08/2007                All PT delivered under supervision of Licensed Physical Brewing technologist.   Alderson, Donalynn Furlong, Student PT 11/28/2014, 3:46 PM   I have entered the G-Code information. Alderson, Donalynn Furlong, PT, DPT, NCS 3:48 PM

## 2014-11-09 ENCOUNTER — Ambulatory Visit: Payer: Medicare Other | Admitting: Internal Medicine

## 2014-11-09 NOTE — Progress Notes (Signed)
Medicine attending: Medical history, presenting problems, physical findings, and medications, reviewed with resident physician Dr Jacqueline Gill  on the day of the patient visit and I concur with her evaluation and management plan. 

## 2014-11-10 ENCOUNTER — Ambulatory Visit: Payer: Medicare Other | Admitting: Physical Therapy

## 2014-11-13 ENCOUNTER — Ambulatory Visit: Payer: Medicare Other | Admitting: Physical Therapy

## 2014-11-21 ENCOUNTER — Ambulatory Visit: Payer: Medicare Other | Admitting: Physical Therapy

## 2014-11-24 ENCOUNTER — Ambulatory Visit: Payer: Medicare Other | Attending: Orthopedic Surgery | Admitting: Physical Therapy

## 2014-11-24 ENCOUNTER — Encounter: Payer: Self-pay | Admitting: Physical Therapy

## 2014-11-24 DIAGNOSIS — Z89512 Acquired absence of left leg below knee: Secondary | ICD-10-CM | POA: Insufficient documentation

## 2014-11-24 DIAGNOSIS — R269 Unspecified abnormalities of gait and mobility: Secondary | ICD-10-CM

## 2014-11-24 DIAGNOSIS — R6889 Other general symptoms and signs: Secondary | ICD-10-CM | POA: Diagnosis not present

## 2014-11-24 NOTE — Therapy (Signed)
[facility] [address] [place], [place], [address] Phone: [phone]   Fax:  [phone]  Physical Therapy Treatment  Patient Details  Name: Jennifer [name] MRN: [id number] Date of Birth: 1961/03/31  Encounter Date: 11/24/2014      PT End of Session - 11/24/14 1458    Visit Number 21   Number of Visits 24   Date for PT Re-Evaluation 11/16/14   PT Start Time 2993   PT Stop Time 1405   PT Time Calculation (min) 48 min   Equipment Utilized During Treatment Gait belt   Activity Tolerance Patient tolerated treatment well   Behavior During Therapy [facility] for tasks assessed/performed      Past Medical History  Diagnosis Date  . Depression   . GERD (gastroesophageal reflux disease)   . Hyperlipidemia   . Hypertension   . Glaucoma   . PVD (peripheral vascular disease) with claudication     Stents to bilateral common iliac arteries (left 2005, right 2008), on chronic plavix  . Hyperplastic colon polyp 12/2010    Per colonoscopy (12/2010) - Dr. Deatra Ina  . Asthma   . COPD 01/08/2007    PFT's 05/2007 : FEV1/FVC 82, FEV1 64% pred, FEF 25-75% 40% predicted, 16% improvement in FEV1 with bronchodilators.     . Tobacco abuse   . Chronic osteomyelitis of foot     chronic, right secondary to diabetic foot ulcers  . Polymicrobial bacterial infection 01/2013    GBS and S. aureus bacteremia // Source likely infected diabetic foot ulcer  . 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 Blood cultures 01/26/13 S. Aureus and GBS. Blood cultures Feb 6th, 8th, and 9th and March were negative.Repeat TEE 3/20 negative for vegitations  . 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 Chronic diastolic heart failure     grade 2 per 2D echocardiogram (01/2013)  . Ulcer of foot, chronic     Left. No OM per MRI (01/2013)  . DVT of upper extremity (deep vein thrombosis) 03/11/2013    Secondary to PICC  line. Right brachial vein, diagnosed on 03/10/2013 Coumadin for 3 months. End date 06/10/2013   . Chronic pain syndrome 12/03/2011    Likely secondary to depression, "fibromyalgia", neuropathy, and obesity. Lumbar MRI 2014 no sig change from prior (2008) : Stable hypertrophic facet disease most notable at L4-5. Stable shallow left foraminal/extraforaminal disc protrusion at L4-5. No direct neural compression.      . Environmental allergies     Hx: of  . Fatty liver 2003    observed on ultrasound abdomen  . CHF (congestive heart failure)   . Anginal pain     '3' of 10 ischemia ruled out 9/9   . Fibromyalgia   . Chronic bronchitis     "I get it alot" (09/28/2013)  . Exertional shortness of breath   . Rheumatoid arthritis   . Arthritis of lumbar spine   . Chronic lower back pain   . Diabetic peripheral neuropathy   . Type II diabetes mellitus with peripheral circulatory disorders, uncontrolled DX: 1993    Insulin dep. Poor control. Complicated by diabetic foot ulcer and diabetic eye disease.    . 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 2/2 tobacco use, DM, and cont weight bearing on surgical wound and developed gangrene     Past Surgical  History  Procedure Laterality Date  . Abdominal hysterectomy  1997    secondary to uterine fibroids  . Tubal ligation    . Shoulder arthroscopy w/ rotator cuff repair Bilateral   . Breast biopsy      multiple-benign per pt  . Wrist surgery Right     "for tumors" (09/28/2013)  . Ganglion cyst excision      multiple  . Other surgical history      stents in lower ext  . Tee without cardioversion N/A 01/31/2013    Procedure: TRANSESOPHAGEAL ECHOCARDIOGRAM (TEE);  Surgeon: Fay Records, MD;  Location: [place];  Service: Cardiovascular;  Laterality: N/A;  Rm [phone]  . Bladder surgery      bladder reconstruction surgery  . Tee without cardioversion N/A 03/10/2013    Procedure:  TRANSESOPHAGEAL ECHOCARDIOGRAM (TEE);  Surgeon: Larey Dresser, MD;  Location: [place];  Service: Cardiovascular;  Laterality: N/A;  Rm. [phone]  . Skin split graft Bilateral 05/13/2013    Procedure: Right and Left Foot Allograft Skin Graft;  Surgeon: Newt Minion, MD;  Location: [place];  Service: Orthopedics;  Laterality: Bilateral;  Right and Left Foot Allograft Skin Graft  . Toe amputation Left 08/31/2013    4TH & 5 TH TOE   . Amputation Left 08/31/2013    Procedure: AMPUTATION RAY;  Surgeon: Newt Minion, MD;  Location: [place];  Service: Orthopedics;  Laterality: Left;  Left Foot 5th Ray Amputation  . Esophagogastroduodenoscopy N/A 09/20/2013    Procedure: ESOPHAGOGASTRODUODENOSCOPY (EGD);  Surgeon: Jerene Bears, MD;  Location: [place];  Service: Gastroenterology;  Laterality: N/A;  . Foot amputation through metatarsal Left 09/28/2013  . Tonsillectomy    . Amputation Left 09/28/2013    Procedure: Left Midfoot amputation;  Surgeon: Newt Minion, MD;  Location: [place];  Service: Orthopedics;  Laterality: Left;  Left Midfoot amputation  . Amputation Left 10/14/2013    Procedure: AMPUTATION BELOW KNEE- left;  Surgeon: Newt Minion, MD;  Location: [place];  Service: Orthopedics;  Laterality: Left;  Left Below Knee Amputation     There were no vitals taken for this visit.  Visit Diagnosis:  Abnormality of gait  Activity intolerance  Status post below knee amputation of left lower extremity      Subjective Assessment - 11/24/14 1322    Symptoms No new complaints. No falls. Using cane in the home some, feels more comfortable with walker outside of therapy.   Currently in Pain? Yes   Pain Score 8    Pain Location Other (Comment)  everywhere with fibromyalgia   Pain Orientation Lower;Mid   Pain Descriptors / Indicators Aching;Sore   Pain Type Chronic pain   Pain Onset More than a month ago   Pain Frequency Constant   Aggravating Factors  increased activity   Pain Relieving Factors  rest, medication   Effect of Pain on Daily Activities limits activity due to pain            [facility] Adult PT Treatment/Exercise - 11/24/14 1324    Transfers   Sit to Stand 6: Modified independent (Device/Increase time);With upper extremity assist;From chair/3-in-1;From bed   Stand to Sit 6: Modified independent (Device/Increase time);With upper extremity assist;To bed;To chair/3-in-1   Ambulation/Gait   Ambulation/Gait Yes   Ambulation/Gait Assistance 6: Modified independent (Device/Increase time);5: Supervision  supervision with cane only   Ambulation/Gait Assistance Details cues on posture with gait. supervision for 60 feet with cane while carring cup of ice/water  around barriers/furniture.   Ambulation Distance (Feet) 270 Feet  before needed to sit due to fatigue/back pain. 26f x1   Assistive device Rolling walker;Straight cane   Gait Pattern Step-through pattern;Decreased stride length;Trunk flexed   Gait velocity 21.31 seconds=   Stairs Yes   Stairs Assistance 6: Modified independent (Device/Increase time)   Stairs Assistance Details (indicate cue type and reason) cues on posture, demo'd safe technique   Stair Management Technique One rail Right;Step to pattern;Sideways;Forwards   Number of Stairs 4   Ramp 6: Modified independent (Device)   Ramp Details (indicate cue type and reason) RW/prosthesis   Curb 6: Modified independent (Device/increase time)   Curb Details (indicate cue type and reason) RW/prosthesis   Berg Balance Test   Sit to Stand Able to stand  independently using hands   Standing Unsupported Able to stand safely 2 minutes   Sitting with Back Unsupported but Feet Supported on Floor or Stool Able to sit safely and securely 2 minutes   Stand to Sit Controls descent by using hands   Transfers Able to transfer safely, minor use of hands   Standing Unsupported with Eyes Closed Able to stand 10 seconds safely   Standing Ubsupported with Feet Together Able to place  feet together independently and stand 1 minute safely   From Standing, Reach Forward with Outstretched Arm Can reach forward >12 cm safely (5")  8 inches   From Standing Position, Pick up Object from Floor Able to pick up shoe safely and easily   From Standing Position, Turn to Look Behind Over each Shoulder Looks behind one side only/other side shows less weight shift  right > left side   Turn 360 Degrees Able to turn 360 degrees safely but slowly  > 14 seconds each way   Standing Unsupported, Alternately Place Feet on Step/Stool Able to complete >2 steps/needs minimal assist  min HHA with very light pressure   Standing Unsupported, One Foot in Front Able to take small step independently and hold 30 seconds   Standing on One Leg Tries to lift leg/unable to hold 3 seconds but remains standing independently   Total Score 42   Prosthetics   Prosthetic Care Comments  Saw Bobby with [employer] yesterday: added larger pads yesterday due to pain at tibial crest (tibial pads). Goes next week to have prosthesis covered.                     Current prosthetic wear tolerance (days/week)  7   Current prosthetic wear tolerance (#hours/day)  all awake hours   Residual limb condition  intact per patient   Donning Prosthesis Modified independent (device/increased time)   Doffing Prosthesis Modified independent (device/increased time)            PT Short Term Goals - 11/24/14 1502    PT SHORT TERM GOAL #2   Period Days          PT Long Term Goals - 11/24/14 1503    PT LONG TERM GOAL #1   Title demonstrate & verbalize proper prosthetic care to enable safe utilization of the prosthesis. 11/16/2014   Baseline 11/24/2014- met today.   Status Achieved   PT LONG TERM GOAL #2   Title tolerate wear of prosthesis for >90% of awake hours without change in skin integrity nor undue tenderness. 11/16/2014   Baseline 11/24/2014- wearing all awake hour with removing to dry limb/liner as needed every day per  pt report   Status Achieved  PT LONG TERM GOAL #3   Title Ambulate 328f with rolling walker & prosthesis modified independent.11/16/2014   Baseline 11/24/1014- Pt able to walk 270 feet with rolling walker before needing to sit down due to pain/fatigue modified independent.   Status Not Met   PT LONG TERM GOAL #4   Title ambulate 519 around household furniture carrying cup or plate with single point cane & prosthesis modified independent. 11/16/2014   Baseline 11/24/2014- Pt able to walk 60 feet with cane while carring cup of ice/water with supervision due to unsteadiness.   Status Not Met   PT LONG TERM GOAL #5   Title negotiate ramp, curb, stairs (1 rail) with rolling walker & prosthesis modified independent. 11/16/2014   Baseline 12/0402015- met all today.   Status Achieved   PT LONG TERM GOAL #6   Title perform standing activities for > 10 minutes & prosthesis with limb /back pain increasing <2 increments on 0-10 scale. 11/16/2014   Baseline 11/24/2014- met with pain 5/10 before session and 7/10 after session.   Status Achieved   PT LONG TERM GOAL #7   Title Berg Balance Test score with & prosthesis >/= 40/56. 11/16/2014   Baseline 11/24/2014- met with score of 42/56 today.   Status Achieved          Plan - 11/24/14 1459    Clinical Impression Statement Pt met most goals today for discharge. Pt to continue with HEP for back/core strengthening issued with previous episode of therapy and with sink HEP for balance/proprioception with prosthesis. Pt to continue with use of walker in community and cane in home with supervision. Still is limited by back pain with increased activity. Reported pain as 7/10 after session today (was 5/10 before).                               Pt will benefit from skilled therapeutic intervention in order to improve on the following deficits Abnormal gait;Decreased coordination;Difficulty walking;Decreased activity tolerance;Decreased balance;Decreased  knowledge of use of DME;Decreased mobility;Decreased strength  prosthetic dependency   Rehab Potential Good   PT Frequency 2x / week   PT Duration 8 weeks   PT Treatment/Interventions Therapeutic activities;Neuromuscular re-education;Manual techniques;Gait training;Patient/family education;Therapeutic exercise;ADLs/Self Care Home Management;Other (comment)  prosthetic training   PT Next Visit Plan Discharge per PT plan of care   Consulted and Agree with Plan of Care Patient;Family member/caregiver   Family Member Consulted son         Problem List Patient Active Problem List   Diagnosis Date Noted  . Anemia 10/05/2014  . Chronic diastolic heart failure   . S/P BKA (below knee amputation) unilateral   . Tobacco abuse   . Severe obesity (BMI >= 40) 03/02/2013  . Abnormality of gait 03/01/2013  . Infective endocarditis 01/22/2013  . Healthcare maintenance 07/10/2012  . Chronic pain syndrome 12/03/2011  . PVD in DM 08/27/2011  . GLAUCOMA 11/29/2009  . Benign essential HTN 11/29/2009  . INSOMNIA 10/25/2009  . GASTROESOPHAGEAL REFLUX DISEASE 11/24/2008  . DEPRESSION 04/06/2008  . DM (diabetes mellitus) type II uncontrolled, periph vascular disorder 04/02/2007  . Hyperlipidemia 01/08/2007  . COPD 01/08/2007    BWillow Ora12/03/2014, 3:11 PM  KWillow Ora PTA, [place] ParkGMarvell [place] [phone]/03/2014, 3:13 PM

## 2014-11-29 ENCOUNTER — Other Ambulatory Visit: Payer: Self-pay | Admitting: Physical Therapy

## 2014-11-29 ENCOUNTER — Other Ambulatory Visit: Payer: Self-pay | Admitting: Internal Medicine

## 2014-11-29 ENCOUNTER — Encounter: Payer: Self-pay | Admitting: Physical Therapy

## 2014-11-29 DIAGNOSIS — Z89512 Acquired absence of left leg below knee: Secondary | ICD-10-CM

## 2014-11-29 DIAGNOSIS — R6889 Other general symptoms and signs: Secondary | ICD-10-CM

## 2014-11-29 DIAGNOSIS — R269 Unspecified abnormalities of gait and mobility: Secondary | ICD-10-CM

## 2014-11-29 NOTE — Therapy (Unsigned)
[facility] [address] [place], [place], [address] Phone: [phone]   Fax:  [phone]  Patient Details  Name: Jennifer [name] MRN: [id number] Date of Birth: 20-Jan-1961  Encounter Date: 11/29/2014 PHYSICAL THERAPY Re-Certification / DISCHARGE SUMMARY  Visits from Start of Care: 21 This is 1 visit beyond certification period due to patient scheduling issues. So Re-certification needs to be signed to cover last visit.  Current functional level related to goals / functional outcomes:  PT LONG TERM GOAL #1    Title  demonstrate & verbalize proper prosthetic care to enable safe utilization of the prosthesis. 11/16/2014    Baseline  11/24/2014- met today.    Status  Achieved    PT LONG TERM GOAL #2    Title  tolerate wear of prosthesis for >90% of awake hours without change in skin integrity nor undue tenderness. 11/16/2014    Baseline  11/24/2014- wearing all awake hour with removing to dry limb/liner as needed every day per pt report    Status  Achieved    PT LONG TERM GOAL #3    Title  Ambulate 365f with rolling walker & prosthesis modified independent.11/16/2014    Baseline  11/24/1014- Pt able to walk 270 feet with rolling walker before needing to sit down due to pain/fatigue modified independent.    Status  Not Met    PT LONG TERM GOAL #4    Title  ambulate 577 around household furniture carrying cup or plate with single point cane & prosthesis modified independent. 11/16/2014    Baseline  11/24/2014- Pt able to walk 60 feet with cane while carring cup of ice/water with supervision due to unsteadiness.    Status  Not Met    PT LONG TERM GOAL #5    Title  negotiate ramp, curb, stairs (1 rail) with rolling walker & prosthesis modified independen- t. 11/16/2014    Baseline  12/0402015- met all today.    Status  Achieved    PT LONG TERM GOAL #6    Title  perform standing  activities for > 10 minutes & prosthesis with limb /back pain increasing <2 increments on 0-10 scale. 11/16/2014    Baseline  11/24/2014- met with pain 5/10 before session and 7/10 after session.    Status  Achieved    PT LONG TERM GOAL #7    Title  Berg Balance Test score with & prosthesis >/= 40/56. 11/16/2014    Baseline  11/24/2014- met with score of 42/56 today.    Status  Achieved       Remaining deficits: Patient is ambulating with prosthesis in home & community including stairs to enter /exit her apt building with walker & prosthesis modified independent. She can use a cane in her home to free up a hand to carry items but needs supervision (son lives with her) due to some unsteadiness. She continues to have chronic low back pain that limits activities at times but no issues with prosthesis.   Education / Equipment: Patient was instructed with verbalization / return demonstration of understanding in prosthetic care & HEP.  Plan: Patient agrees to discharge.  Patient goals were partially met. Patient is being discharged due to meeting the stated rehab goals.  ?????       RJamey Reas PT, DPT PT Specializing in Prosthetics & Orthotics 11/29/2014 9:25 AM Phone:  [phone] Fax:  [phone]SBaytownGWillow [place] [id number] Reas12/08/2014, 9:19 AM

## 2014-11-30 NOTE — Telephone Encounter (Signed)
pls phone in lyrica

## 2014-12-01 NOTE — Telephone Encounter (Signed)
Lyrica called into pharmacy.Despina Hidden Cassady12/11/201512:04 PM

## 2014-12-18 ENCOUNTER — Other Ambulatory Visit: Payer: Self-pay | Admitting: *Deleted

## 2014-12-18 ENCOUNTER — Encounter: Payer: Self-pay | Admitting: *Deleted

## 2014-12-18 ENCOUNTER — Telehealth: Payer: Self-pay | Admitting: *Deleted

## 2014-12-18 DIAGNOSIS — G894 Chronic pain syndrome: Secondary | ICD-10-CM

## 2014-12-18 MED ORDER — OXYCODONE-ACETAMINOPHEN 5-325 MG PO TABS: 2.0000 | ORAL_TABLET | ORAL | Status: DC | PRN

## 2014-12-18 NOTE — Progress Notes (Signed)
Pt informed script ready 

## 2014-12-18 NOTE — Telephone Encounter (Signed)
Jennifer [name] destroy the Rx from Dr Ellwood Dense. Jennifer [name] must find the Rx I gave her in Oct.

## 2014-12-18 NOTE — Telephone Encounter (Signed)
I gave her three paper scripts for oxycodone 10/15. She should have script in her possession.

## 2014-12-18 NOTE — Telephone Encounter (Signed)
Hi,  On October 29, Jennifer [name] gave the pt 2 scripts, there is one left in the folder, dated October 14 from you, so we dont think she got 3 at the office visit also we just placed dr bhardwaj's script in the folder to use next month, we can discuss this before that one is due

## 2014-12-19 NOTE — Telephone Encounter (Signed)
I am happy to comply with Dr [name].

## 2014-12-26 ENCOUNTER — Other Ambulatory Visit: Payer: Self-pay | Admitting: Internal Medicine

## 2015-01-04 ENCOUNTER — Encounter: Payer: Medicare Other | Admitting: Internal Medicine

## 2015-01-04 ENCOUNTER — Encounter: Payer: Self-pay | Admitting: Internal Medicine

## 2015-01-09 ENCOUNTER — Ambulatory Visit (INDEPENDENT_AMBULATORY_CARE_PROVIDER_SITE_OTHER): Payer: Medicare Other | Admitting: Internal Medicine

## 2015-01-09 ENCOUNTER — Telehealth: Payer: Self-pay | Admitting: Licensed Clinical Social Worker

## 2015-01-09 ENCOUNTER — Encounter: Payer: Self-pay | Admitting: Internal Medicine

## 2015-01-09 VITALS — BP 212/91 | HR 84 | Temp 98.0°F | Ht 67.0 in | Wt 286.6 lb

## 2015-01-09 DIAGNOSIS — F17210 Nicotine dependence, cigarettes, uncomplicated: Secondary | ICD-10-CM

## 2015-01-09 DIAGNOSIS — I10 Essential (primary) hypertension: Secondary | ICD-10-CM | POA: Diagnosis not present

## 2015-01-09 DIAGNOSIS — Z993 Dependence on wheelchair: Secondary | ICD-10-CM

## 2015-01-09 DIAGNOSIS — L723 Sebaceous cyst: Secondary | ICD-10-CM

## 2015-01-09 DIAGNOSIS — I5032 Chronic diastolic (congestive) heart failure: Secondary | ICD-10-CM

## 2015-01-09 DIAGNOSIS — Z794 Long term (current) use of insulin: Secondary | ICD-10-CM

## 2015-01-09 DIAGNOSIS — E1165 Type 2 diabetes mellitus with hyperglycemia: Secondary | ICD-10-CM | POA: Diagnosis not present

## 2015-01-09 DIAGNOSIS — E1151 Type 2 diabetes mellitus with diabetic peripheral angiopathy without gangrene: Secondary | ICD-10-CM

## 2015-01-09 DIAGNOSIS — Z89512 Acquired absence of left leg below knee: Secondary | ICD-10-CM

## 2015-01-09 DIAGNOSIS — IMO0002 Reserved for concepts with insufficient information to code with codable children: Secondary | ICD-10-CM

## 2015-01-09 DIAGNOSIS — Z720 Tobacco use: Secondary | ICD-10-CM

## 2015-01-09 LAB — GLUCOSE, CAPILLARY: Glucose-Capillary: 103 mg/dL — ABNORMAL HIGH (ref 70–99)

## 2015-01-09 LAB — POCT GLYCOSYLATED HEMOGLOBIN (HGB A1C): Hemoglobin A1C: 7.8

## 2015-01-09 NOTE — Assessment & Plan Note (Signed)
BP Readings from Last 3 Encounters:  01/09/15 212/91  10/19/14 143/75  10/05/14 174/57    Lab Results  Component Value Date   NA 145 10/05/2014   K 3.9 10/05/2014   CREATININE 0.66 10/05/2014    Assessment: Blood pressure control: severely elevated Progress toward BP goal:  deteriorated Comments: did not take medications this morning, reports she is just concerned with lump on thigh and that what she wants addressed.  Plan: Medications: Benazepril 40mg  daily, Amlodipine 5mg  daily, Lasix 80mg  daily Educational resources provided:   Self management tools provided:   Other plans: Stressed importance of taking medication, will have close follow up.

## 2015-01-09 NOTE — Assessment & Plan Note (Signed)
Patient's chief complaint today is a lump on her left inner thigh which reports has been bothering her for the last 3 months.  She reports that is has changed in size quite a bit over the time and that she has been able to "pop" it at least twice with a white and occasionally bloody material exiting.  She reports that it is currently smaller than it has been.  She reports that it is irritating but not too tender to the touch.  She has not had any fevers or chills.  She reports she decided to bring it up today because her home health nurse told her she should last thursday instead of trying to "pop" it again.  - Exam and history suggestive of sebaceous cyst rather than abscess.  Will refer patient to general surgery for excision.

## 2015-01-09 NOTE — Progress Notes (Signed)
[facility] Subjective:   [name] female   DOB: 1961/10/15 54 y.o.   MRN: [id number]  HPI: Jennifer [name] is a 54 y.o. female with a PMH below who presents for an acute visit for "cyst" on thigh please see problem based A&P for further HPI details below.    Past Medical History  Diagnosis Date  . Depression   . GERD (gastroesophageal reflux disease)   . Hyperlipidemia   . Hypertension   . Glaucoma   . PVD (peripheral vascular disease) with claudication     Stents to bilateral common iliac arteries (left 2005, right 2008), on chronic plavix  . Hyperplastic colon polyp 12/2010    Per colonoscopy (12/2010) - Dr. Deatra Ina  . Asthma   . COPD 01/08/2007    PFT's 05/2007 : FEV1/FVC 82, FEV1 64% pred, FEF 25-75% 40% predicted, 16% improvement in FEV1 with bronchodilators.     . Tobacco abuse   . Chronic osteomyelitis of foot     chronic, right secondary to diabetic foot ulcers  . Polymicrobial bacterial infection 01/2013    GBS and S. aureus bacteremia // Source likely infected diabetic foot ulcer  . 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 Blood cultures 01/26/13 S. Aureus and GBS. Blood cultures Feb 6th, 8th, and 9th and March were negative.Repeat TEE 3/20 negative for vegitations  . 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 Chronic diastolic heart failure     grade 2 per 2D echocardiogram (01/2013)  . Ulcer of foot, chronic     Left. No OM per MRI (01/2013)  . DVT of upper extremity (deep vein thrombosis) 03/11/2013    Secondary to PICC line. Right brachial vein, diagnosed on 03/10/2013 Coumadin for 3 months. End date 06/10/2013   . Chronic pain syndrome 12/03/2011    Likely secondary to depression, "fibromyalgia", neuropathy, and obesity. Lumbar MRI 2014 no sig change from prior (2008) : Stable hypertrophic facet disease most notable at L4-5. Stable shallow left  foraminal/extraforaminal disc protrusion at L4-5. No direct neural compression.      . Environmental allergies     Hx: of  . Fatty liver 2003    observed on ultrasound abdomen  . CHF (congestive heart failure)   . Anginal pain     '3' of 10 ischemia ruled out 9/9   . Fibromyalgia   . Chronic bronchitis     "I get it alot" (09/28/2013)  . Exertional shortness of breath   . Rheumatoid arthritis   . Arthritis of lumbar spine   . Chronic lower back pain   . Diabetic peripheral neuropathy   . Type II diabetes mellitus with peripheral circulatory disorders, uncontrolled DX: 1993    Insulin dep. Poor control. Complicated by diabetic foot ulcer and diabetic eye disease.    . 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 2/2 tobacco use, DM, and cont weight bearing on surgical wound and developed gangrene    Current Outpatient Prescriptions  Medication Sig Dispense Refill  . ADVAIR DISKUS 250-50 MCG/DOSE AEPB TAKE 1 INHALATION BY MOUTH TWICE DAILY 180 each 3  . albuterol (PROVENTIL HFA;VENTOLIN HFA) 108 (90 BASE) MCG/ACT inhaler Inhale 2 puffs into the lungs every 6 (six) hours as needed for wheezing or shortness of breath.    Marland Kitchen albuterol (PROVENTIL) (2.5 MG/3ML) 0.083% nebulizer  solution INHALE THE CONTENTS OF 1 VIAL VIA NEBULIZER EVERY 6 HOURS AS NEEDED FOR WHEEZING 360 mL 3  . amLODipine (NORVASC) 5 MG tablet Take 1 tablet (5 mg total) by mouth daily. 90 tablet 3  . atenolol (TENORMIN) 100 MG tablet Take 1 tablet (100 mg total) by mouth daily. 90 tablet 3  . baclofen (LIORESAL) 10 MG tablet Take 1 tablet (10 mg total) by mouth 3 (three) times daily. 90 each 5  . benazepril (LOTENSIN) 40 MG tablet Take 40 mg by mouth daily.    . benzonatate (TESSALON) 100 MG capsule Take 1 capsule (100 mg total) by mouth 3 (three) times daily as needed for cough. 20 capsule 0  . buPROPion (WELLBUTRIN SR) 150 MG 12 hr tablet Take 1 tablet (150 mg  total) by mouth 2 (two) times daily. 180 tablet 3  . diphenhydrAMINE (BENADRYL) 25 MG tablet Take 25 mg by mouth every 6 (six) hours.     Marland Kitchen EASY TOUCH PEN NEEDLES 31G X 8 MM MISC USE TO INJECT INSULIN TWICE DAILY 100 each 11  . esomeprazole (NEXIUM) 20 MG capsule Take 1 capsule (20 mg total) by mouth daily. 90 capsule 3  . fluticasone (FLONASE) 50 MCG/ACT nasal spray Place 1 spray into both nostrils daily. 48 g 3  . furosemide (LASIX) 80 MG tablet Take 1 tablet (80 mg total) by mouth daily. 30 tablet 5  . guaiFENesin (MUCINEX) 600 MG 12 hr tablet Take 1 tablet (600 mg total) by mouth 3 (three) times daily. 45 tablet 0  . Insulin Lispro Prot & Lispro (HUMALOG MIX 75/25 KWIKPEN) (75-25) 100 UNIT/ML Kwikpen Inject 70-135 Units into the skin 2 (two) times daily. Inject 135 units in the morning before breakfast.  Inject 70 units in the evening before evening meal. 15 mL 11  . ipratropium (ATROVENT) 0.02 % nebulizer solution USE 1 VIAL VIA NEBULIZER EVERY 6 HOURS AS NEEDED FOR WHEEZING 75 mL 3  . LYRICA 200 MG capsule TAKE 1 CAPSULE BY MOUTH THREE TIMES A DAY 90 capsule 3  . metFORMIN (GLUCOPHAGE) 1000 MG tablet Take 1 tablet (1,000 mg total) by mouth 2 (two) times daily with a meal. 180 tablet 3  . mupirocin cream (BACTROBAN) 2 % Apply 1 application topically 2 (two) times daily. 15 g 0  . ONETOUCH DELICA LANCETS FINE MISC Use to check CBG QID. 250.00 uncontrolled DM. 100 each 11  . ONETOUCH VERIO test strip USE 3-4 TIMES DAILY TO CHECK BLOOD SUGAR 100 each 11  . oxyCODONE-acetaminophen (PERCOCET/ROXICET) 5-325 MG per tablet Take 2 tablets by mouth every 4 (four) hours as needed for moderate pain or severe pain. 360 tablet 0  . potassium chloride SA (K-DUR,KLOR-CON) 20 MEQ tablet TAKE 2 TABLETS BY MOUTH DAILY 180 tablet 0  . pravastatin (PRAVACHOL) 40 MG tablet Take 1 tablet (40 mg total) by mouth daily. 90 tablet 3  . travoprost, benzalkonium, (TRAVATAN) 0.004 % ophthalmic solution Place 1 drop into  both eyes at bedtime.     . traZODone (DESYREL) 100 MG tablet Take 1 tablet (100 mg total) by mouth at bedtime as needed for sleep. 90 tablet 3  . varenicline (CHANTIX) 1 MG tablet Take 1 tablet (1 mg total) by mouth 2 (two) times daily. Use this bottle after the other bottle. Stop smoking when you start this dosage. 60 tablet 0  . [DISCONTINUED] Potassium Chloride ER 20 MEQ TBCR Take 40 mEq by mouth daily. 30 tablet 0   No current facility-administered medications  for this visit.   Family History  Problem Relation Age of Onset  . Diverticulosis Mother   . Diabetes Mother   . Hypertension Mother   . Congestive Heart Failure Mother   . Asthma Father   . CAD Sister 52    MI at age 62 per patient.  However, she has not had a stent or CABG.    History   Social History  . Marital Status: Single    Spouse Name: N/A    Number of Children: 2  . Years of Education: college   Occupational History  . Disability     previously worked as a IT sales professional History Main Topics  . Smoking status: Current Every Day Smoker -- 0.50 packs/day for 44 years    Types: Cigarettes    Last Attempt to Quit: 10/03/2013  . Smokeless tobacco: Never Used     Comment: ABOUT 1PPD  . Alcohol Use: No  . Drug Use: No     Comment: 09/28/2013 "no marijuana since 2010/04/16, no crack/cocaine 1989"  . Sexual Activity: Not Currently   Other Topics Concern  . Not on file   Social History Narrative   On disability. Lives with son in [place]. Formerly worked as Training and development officer.    Boyfriend passed away stage 4 cancer 2013/04/16.   S/p L BKA 04/16/2013. In wheelchair in paritially suitable apartment.    Review of Systems: Review of Systems  Constitutional: Negative for fever, chills and weight loss.  Respiratory: Negative for cough and shortness of breath.   Cardiovascular: Positive for leg swelling. Negative for chest pain.  Gastrointestinal: Negative for abdominal pain and diarrhea (improved).  Genitourinary: Negative for  dysuria.  Skin: Negative for rash.  Neurological: Negative for dizziness and headaches.  Psychiatric/Behavioral: Negative for depression.    Objective:  Physical Exam: Filed Vitals:   01/09/15 1428  BP: 212/91  Pulse: 84  Temp: 98 F (36.7 C)  TempSrc: Oral  Height: 5\' 7"  (1.702 m)  Weight: 286 lb 9.6 oz (130.001 kg)  SpO2: 98%  Physical Exam  Constitutional: She is well-developed, well-nourished, and in no distress.  In wheelchair  Cardiovascular: Normal rate, regular rhythm and normal heart sounds.   Pulmonary/Chest: Effort normal and breath sounds normal. She has no wheezes. She has no rales.  Abdominal: Soft. Bowel sounds are normal.  Musculoskeletal: She exhibits edema (very large right leg with 1-2+ pitting edema).  Left leg s/p BKA with prosthesis  Skin:  Left inner thigh 2cmx3cm raised firm lesion, no surrounding erythema or warmth, no drainage. Lela Sturdivant was chaperone   Nursing note and vitals reviewed.   Assessment & Plan:  Case discussed with and patient examined by Dr. Eppie Gibson  Essential hypertension BP Readings from Last 3 Encounters:  01/09/15 212/91  10/19/14 143/75  10/05/14 174/57    Lab Results  Component Value Date   NA 145 10/05/2014   K 3.9 10/05/2014   CREATININE 0.66 10/05/2014    Assessment: Blood pressure control: severely elevated Progress toward BP goal:  deteriorated Comments: did not take medications this morning, reports she is just concerned with lump on thigh and that what she wants addressed.  Plan: Medications: Benazepril 40mg  daily, Amlodipine 5mg  daily, Lasix 80mg  daily Educational resources provided:   Self management tools provided:   Other plans: Stressed importance of taking medication, will have close follow up.    DM (diabetes mellitus) type II uncontrolled, periph vascular disorder Lab Results  Component Value Date  HGBA1C 7.8 01/09/2015   HGBA1C 8.2 10/05/2014   HGBA1C 7.4* 05/03/2014    Patient does  not bring meter today.  Denies any polyuria or polydipsia.  No blurry vision.  Taking Humalog 75/25 140u in AM 85u in PM Assessment: Diabetes control: fair control Progress toward A1C goal:  improved Comments: mild improvement in A1c.    Plan: Medications:  Humalog 75/25 140u in am 85 u in PM Home glucose monitoring: Frequency: 3 times a day Timing: before meals Instruction/counseling given: discussed the need for weight loss, discussed diet and discussed sick day management Educational resources provided: brochure Self management tools provided:   Other plans: F/U in one week for HTN, asked to bring meter for insulin adjustment otherwise is doing fairly well      Chronic diastolic heart failure Wt Readings from Last 5 Encounters:  01/09/15 286 lb 9.6 oz (130.001 kg)  10/19/14 272 lb 3.2 oz (123.469 kg)  10/05/14 285 lb 12.8 oz (129.638 kg)  07/03/14 275 lb 6.4 oz (124.921 kg)  06/19/14 282 lb 12.8 oz (128.277 kg)    Denies SOB, sleeps with 1 pillow, able to lay comfortably flat on exam table.  Weight up, has some pitting edema of right leg however lungs are CTA.  Will need to control BP and have patient take her home lasix.  Will have her follow up in 1 week  -Resume current medications. (Lasix 80mg  daily)   Sebaceous cyst Patient's chief complaint today is a lump on her left inner thigh which reports has been bothering her for the last 3 months.  She reports that is has changed in size quite a bit over the time and that she has been able to "pop" it at least twice with a white and occasionally bloody material exiting.  She reports that it is currently smaller than it has been.  She reports that it is irritating but not too tender to the touch.  She has not had any fevers or chills.  She reports she decided to bring it up today because her home health nurse told her she should last thursday instead of trying to "pop" it again.  - Exam and history suggestive of sebaceous cyst  rather than abscess.  Will refer patient to general surgery for excision.   Tobacco abuse  Assessment: Progress toward smoking cessation:  smoking the same amount Barriers to progress toward smoking cessation:  lack of motivation to quit Comments:   Plan: Instruction/counseling given:  I counseled patient on the dangers of tobacco use, advised patient to stop smoking, and reviewed strategies to maximize success. Educational resources provided:    Self management tools provided:  smoking cessation plan (STAR Quit Plan) Medications to assist with smoking cessation:  Varenicline (Chantix) Patient agreed to the following self-care plans for smoking cessation: set a quit date and stop smoking  Other plans: patient does not seem to be fully committed to smoking cessation.       Medications Ordered No orders of the defined types were placed in this encounter.   Other Orders Orders Placed This Encounter  Procedures  . Glucose, capillary  . Ambulatory referral to General Surgery    Referral Priority:  Routine    Referral Type:  Surgical    Referral Reason:  Specialty Services Required    Requested Specialty:  General Surgery    Number of Visits Requested:  1  . POC Hbg A1C

## 2015-01-09 NOTE — Patient Instructions (Signed)
General Instructions:  Please take your blood pressure medications every day as directed.  I have referred you over to the surgeon to see about the cyst on your thigh.  Please follow up in 1 week.  Please bring your medicines with you each time you come to clinic.  Medicines may include prescription medications, over-the-counter medications, herbal remedies, eye drops, vitamins, or other pills.   Progress Toward Treatment Goals:  Treatment Goal 01/09/2015  Hemoglobin A1C unable to assess  Blood pressure deteriorated  Stop smoking smoking the same amount  Prevent falls unable to assess    Self Care Goals & Plans:  Self Care Goal 01/09/2015  Manage my medications take my medicines as prescribed; bring my medications to every visit; refill my medications on time  Monitor my health -  Eat healthy foods eat more vegetables; eat foods that are low in salt; eat baked foods instead of fried foods  Be physically active find an activity I enjoy  Stop smoking set a quit date and stop smoking  Prevent falls -  Meeting treatment goals -    Home Blood Glucose Monitoring 01/09/2015  Check my blood sugar 3 times a day  When to check my blood sugar before meals     Care Management & Community Referrals:  Referral 01/09/2015  Referrals made for care management support none needed  Referrals made to community resources -

## 2015-01-09 NOTE — Assessment & Plan Note (Addendum)
Lab Results  Component Value Date   HGBA1C 7.8 01/09/2015   HGBA1C 8.2 10/05/2014   HGBA1C 7.4* 05/03/2014    Patient does not bring meter today.  Denies any polyuria or polydipsia.  No blurry vision.  Taking Humalog 75/25 140u in AM 85u in PM Assessment: Diabetes control: fair control Progress toward A1C goal:  improved Comments: mild improvement in A1c.    Plan: Medications:  Humalog 75/25 140u in am 85 u in PM Home glucose monitoring: Frequency: 3 times a day Timing: before meals Instruction/counseling given: discussed the need for weight loss, discussed diet and discussed sick day management Educational resources provided: brochure Self management tools provided:   Other plans: F/U in one week for HTN, asked to bring meter for insulin adjustment otherwise is doing fairly well

## 2015-01-09 NOTE — Assessment & Plan Note (Signed)
  Assessment: Progress toward smoking cessation:  smoking the same amount Barriers to progress toward smoking cessation:  lack of motivation to quit Comments:   Plan: Instruction/counseling given:  I counseled patient on the dangers of tobacco use, advised patient to stop smoking, and reviewed strategies to maximize success. Educational resources provided:    Self management tools provided:  smoking cessation plan (STAR Quit Plan) Medications to assist with smoking cessation:  Varenicline (Chantix) Patient agreed to the following self-care plans for smoking cessation: set a quit date and stop smoking  Other plans: patient does not seem to be fully committed to smoking cessation.

## 2015-01-09 NOTE — Telephone Encounter (Signed)
Jennifer [name] was referred to [facility] as pt has no showed [facility] appointments and has an appointment this afternoon.  Pt is current with traditional Medicare A and Partial Medicaid benefits.  Pt does not have access to Medical Medicaid Transportation benefit.  CSW placed call to Ms. Wickstrom to confirm pt is aware of appointment and has transportation set up for today.  Pt confirms transportation has been arranged for today through [employer], but states she will not have additional funds until after 01/24/15.  CSW informed Ms. Liddell of the ability to [place] for [place], unsure if the [place] pass will work with [place].  CSW will leave Ms. Gillaspie a [place] bus pass for transportation home for today's appointment.  Pt has been deferred from [facility] in the past.

## 2015-01-09 NOTE — Assessment & Plan Note (Signed)
Wt Readings from Last 5 Encounters:  01/09/15 286 lb 9.6 oz (130.001 kg)  10/19/14 272 lb 3.2 oz (123.469 kg)  10/05/14 285 lb 12.8 oz (129.638 kg)  07/03/14 275 lb 6.4 oz (124.921 kg)  06/19/14 282 lb 12.8 oz (128.277 kg)    Denies SOB, sleeps with 1 pillow, able to lay comfortably flat on exam table.  Weight up, has some pitting edema of right leg however lungs are CTA.  Will need to control BP and have patient take her home lasix.  Will have her follow up in 1 week  -Resume current medications. (Lasix 80mg  daily)

## 2015-01-10 NOTE — Progress Notes (Signed)
I saw and evaluated the patient.  I personally confirmed the key portions of Dr. Hoffman's history and exam and reviewed pertinent patient test results.  The assessment, diagnosis, and plan were formulated together and I agree with the documentation in the resident's note. 

## 2015-01-22 ENCOUNTER — Other Ambulatory Visit: Payer: Self-pay | Admitting: *Deleted

## 2015-01-22 ENCOUNTER — Telehealth: Payer: Self-pay | Admitting: Internal Medicine

## 2015-01-22 DIAGNOSIS — G894 Chronic pain syndrome: Secondary | ICD-10-CM

## 2015-01-22 NOTE — Telephone Encounter (Signed)
   Reason for call:   I received a call from [name] at 4:40 PM indicating that she was out of percocet and needed a refill.   Pertinent Data:   She was last prescribed Percocet 5/325 two tablets q4h prn #360, refills 0 on 12/18/14.     Assessment / Plan / Recommendations:   I advised the patient that I would forward a message to her PCP.   Francesca Oman, DO   01/22/2015, 4:45 PM

## 2015-01-22 NOTE — Telephone Encounter (Signed)
I will print off 3 scripts tomorrow at 1:30. She may have one tomorrow and get the other 2 when she comes later in month for appt Dr Hayes Ludwig. Alternatively, if transportation an issue, may make appt tomorrow and get all 3 Rx then and cancel Dr Bernadene Bell appt. Needs appt bc pain not addressed last appt with [facility] resident and had no showed appt with me.

## 2015-01-23 MED ORDER — OXYCODONE-ACETAMINOPHEN 5-325 MG PO TABS: 2.0000 | ORAL_TABLET | ORAL | Status: DC | PRN

## 2015-01-31 ENCOUNTER — Ambulatory Visit (INDEPENDENT_AMBULATORY_CARE_PROVIDER_SITE_OTHER): Payer: Medicare Other | Admitting: Internal Medicine

## 2015-01-31 ENCOUNTER — Encounter: Payer: Self-pay | Admitting: Internal Medicine

## 2015-01-31 ENCOUNTER — Telehealth: Payer: Self-pay | Admitting: Internal Medicine

## 2015-01-31 ENCOUNTER — Telehealth: Payer: Self-pay | Admitting: *Deleted

## 2015-01-31 VITALS — BP 164/66 | HR 70 | Temp 98.5°F | Wt 281.1 lb

## 2015-01-31 DIAGNOSIS — J449 Chronic obstructive pulmonary disease, unspecified: Secondary | ICD-10-CM

## 2015-01-31 DIAGNOSIS — I10 Essential (primary) hypertension: Secondary | ICD-10-CM | POA: Diagnosis not present

## 2015-01-31 DIAGNOSIS — G894 Chronic pain syndrome: Secondary | ICD-10-CM | POA: Diagnosis not present

## 2015-01-31 DIAGNOSIS — I5032 Chronic diastolic (congestive) heart failure: Secondary | ICD-10-CM

## 2015-01-31 DIAGNOSIS — R1032 Left lower quadrant pain: Secondary | ICD-10-CM

## 2015-01-31 DIAGNOSIS — J42 Unspecified chronic bronchitis: Secondary | ICD-10-CM

## 2015-01-31 DIAGNOSIS — J069 Acute upper respiratory infection, unspecified: Secondary | ICD-10-CM | POA: Diagnosis not present

## 2015-01-31 LAB — GLUCOSE, CAPILLARY: Glucose-Capillary: 111 mg/dL — ABNORMAL HIGH (ref 70–99)

## 2015-01-31 MED ORDER — LEVOFLOXACIN 750 MG PO TABS: 750.0000 mg | ORAL_TABLET | Freq: Every day | ORAL | Status: DC

## 2015-01-31 MED ORDER — GUAIFENESIN-DM 100-10 MG/5ML PO SYRP: 5.0000 mL | ORAL_SOLUTION | ORAL | Status: DC | PRN

## 2015-01-31 NOTE — Telephone Encounter (Signed)
Pt calls and is very congested and almost unable to talk, states she has been this way since the weekend and is getting worse, does not know if fevers are present, states she feels like she does when she has pneumonia. States her father has a "bad" cold. She is placed in appt slot of 1545 today dr Marvel Plan so she can find transportation, if unable she will call back

## 2015-01-31 NOTE — Assessment & Plan Note (Signed)
Patient complaining of chronic LLQ pain that she states she has had for many months. She denies fever, diarrhea or dysuria. Patient's chronic pain was not addressed during this acute visit, but patient has follow up in 2 weeks. Encouraged continued use of pain medication and to schedule earlier appointment if pain worsened or changed in nature.   -Not addressed, follow up in 2 weeks

## 2015-01-31 NOTE — Assessment & Plan Note (Addendum)
Patient presents with shortness of breath and productive cough of green sputum that has been worsening over the past week. Vitals show patient is afebrile and satting 96% on room air, HR 70, BP 164/66. Lungs are clear to auscultation b/l and patient is moving good air although breath sounds are somewhat decreased. No evidence of rales, wheezes or rhonchi. Patient is about at her baseline weight with 1+ pitting edema in right lower extremity. She has been compliant with lasix. Doubt CHF exacerbation. Without wheezes and good saturation on room air, doubt copd exacerbation. Findings are concerning for acute uri versus community acquired pneumonia. Patient's CURB-65 score is 0 and she does not meet requirement for admission. Patient states her son can stay with her tonight to help her. Given co-morbidities, will treat with levaquin.   Plan: -Levaquin 750 mg daily for 5 days -Robitussin DM for cough -Continue home flonase, albuterol inhalers and nebulizer, ipratropium nebulizer, advair and rest of medications -Will call patient in 1-2 days to check on her -Encouraged rest  -CBC with differential  -BMET -If symptoms worsen, go to the ED

## 2015-01-31 NOTE — Progress Notes (Signed)
   Subjective:    [name], female    DOB: Oct 10, 1961, 54 y.o.   MRN: [id number]  HPI  Jennifer [name] is a 54 yo female with complicated PMHx including COPD, chronic diastolic CHF, L BKA and HTN who is presenting with acute complaint of shortness of breath and nasal congestion. Please see problem oriented assessment and plan for more information.   Review of Systems General: Admits to chills, diaphoresis, decreased appetite, and fatigue. Denies fever. HEENT: Denies nasal congestion or sinus pressure. Respiratory: Admits to SOB and productive cough of green sputum. Denies chest tightness and wheezing.   Cardiovascular: Denies chest pain and palpitations.  Gastrointestinal: Admits to nausea and chronic LLQ pain. Denies vomiting, diarrhea, constipation Genitourinary: Denies dysuria, urgency, frequency Neurological: Denies dizziness, headaches, weakness, lightheadedness    Objective:   Physical Exam Filed Vitals:   01/31/15 1620  BP: 164/66  Pulse: 70  Temp: 98.5 F (36.9 C)  TempSrc: Oral  Weight: 281 lb 1.6 oz (127.506 kg)  SpO2: 96%   General: Vital signs reviewed.  Patient is diaphoretic, in no acute distress.  Cardiovascular: RRR, S1 normal, S2 normal, no murmurs, gallops, or rubs. Pulmonary/Chest: Clear to auscultation bilaterally, no wheezes, rales, or rhonchi. Somewhat decreased breath sounds, but moving good air.  Abdominal: Soft, obese, mildly tender to palpation in LLQ, non-distended, BS + Extremities: 1+ pitting edema in right lower extremity. LLE s/p BKA. Neurological: A&O x3, no confusion. Psychiatric: Tearful and agitated. Patient's mother recently passed away this past weekend.      Assessment & Plan:   Please see problem based assessment and plan for more information.

## 2015-01-31 NOTE — Patient Instructions (Signed)
General Instructions:   Please bring your medicines with you each time you come to clinic.  Medicines may include prescription medications, over-the-counter medications, herbal remedies, eye drops, vitamins, or other pills.   FOR YOUR UPPER RESPIRATORY INFECTION/PNEUMONIA:  1. TAKE LEVAQUIN ANTIBIOTIC 750 MG ONCE DAILY FOR 5 DAYS  2. USE ROBITUSSIN COUGH SYRUP   3. CONTINUE TO USE THE FOLLOWING: FLONASE, NEBULIZERS, INHALERS, ADVAIR  IF YOUR SHORTNESS OF BREATH WORSENS, GO THE EMERGENCY DEPARTMENT  I WILL CALL YOU IN 1-2 DAYS TO SEE HOW YOU ARE IMPROVING    Pneumonia Pneumonia is an infection of the lungs.  CAUSES Pneumonia may be caused by bacteria or a virus. Usually, these infections are caused by breathing infectious particles into the lungs (respiratory tract). SIGNS AND SYMPTOMS   Cough.  Fever.  Chest pain.  Increased rate of breathing.  Wheezing.  Mucus production. DIAGNOSIS  If you have the common symptoms of pneumonia, your health care provider will typically confirm the diagnosis with a chest X-ray. The X-ray will show an abnormality in the lung (pulmonary infiltrate) if you have pneumonia. Other tests of your blood, urine, or sputum may be done to find the specific cause of your pneumonia. Your health care provider may also do tests (blood gases or pulse oximetry) to see how well your lungs are working. TREATMENT  Some forms of pneumonia may be spread to other people when you cough or sneeze. You may be asked to wear a mask before and during your exam. Pneumonia that is caused by bacteria is treated with antibiotic medicine. Pneumonia that is caused by the influenza virus may be treated with an antiviral medicine. Most other viral infections must run their course. These infections will not respond to antibiotics.  HOME CARE INSTRUCTIONS   Cough suppressants may be used if you are losing too much rest. However, coughing protects you by clearing your lungs. You  should avoid using cough suppressants if you can.  Your health care provider may have prescribed medicine if he or she thinks your pneumonia is caused by bacteria or influenza. Finish your medicine even if you start to feel better.  Your health care provider may also prescribe an expectorant. This loosens the mucus to be coughed up.  Take medicines only as directed by your health care provider.  Do not smoke. Smoking is a common cause of bronchitis and can contribute to pneumonia. If you are a smoker and continue to smoke, your cough may last several weeks after your pneumonia has cleared.  A cold steam vaporizer or humidifier in your room or home may help loosen mucus.  Coughing is often worse at night. Sleeping in a semi-upright position in a recliner or using a couple pillows under your head will help with this.  Get rest as you feel it is needed. Your body will usually let you know when you need to rest. PREVENTION A pneumococcal shot (vaccine) is available to prevent a common bacterial cause of pneumonia. This is usually suggested for:  People over 69 years old.  Patients on chemotherapy.  People with chronic lung problems, such as bronchitis or emphysema.  People with immune system problems. If you are over 65 or have a high risk condition, you may receive the pneumococcal vaccine if you have not received it before. In some countries, a routine influenza vaccine is also recommended. This vaccine can help prevent some cases of pneumonia.You may be offered the influenza vaccine as part of your care. If you  smoke, it is time to quit. You may receive instructions on how to stop smoking. Your health care provider can provide medicines and counseling to help you quit. SEEK MEDICAL CARE IF: You have a fever. SEEK IMMEDIATE MEDICAL CARE IF:   Your illness becomes worse. This is especially true if you are elderly or weakened from any other disease.  You cannot control your cough with  suppressants and are losing sleep.  You begin coughing up blood.  You develop pain which is getting worse or is uncontrolled with medicines.  Any of the symptoms which initially brought you in for treatment are getting worse rather than better.  You develop shortness of breath or chest pain. MAKE SURE YOU:   Understand these instructions.  Will watch your condition.  Will get help right away if you are not doing well or get worse. Document Released: 12/08/2005 Document Revised: 04/24/2014 Document Reviewed: 02/27/2011 [facility] Patient Information 2015 [place], [place]. This information is not intended to replace advice given to you by your health care provider. Make sure you discuss any questions you have with your health care provider.

## 2015-01-31 NOTE — Telephone Encounter (Signed)
She will be here at 1545, has found transportation

## 2015-01-31 NOTE — Progress Notes (Signed)
Internal Medicine Clinic Attending  I saw and evaluated the patient.  I personally confirmed the key portions of the history and exam documented by Dr. Richardson and I reviewed pertinent patient test results.  The assessment, diagnosis, and plan were formulated together and I agree with the documentation in the resident's note. 

## 2015-01-31 NOTE — Assessment & Plan Note (Signed)
No wheezes, satting 96% on room air. Compliant with advair, albuterol and ipratropium. Doubt COPD exacerbation at this time.  Plan: -Continue home medication regimen

## 2015-01-31 NOTE — Assessment & Plan Note (Signed)
1+ pitting edema in right lower extremity, but no crackles on examination. Weight is actually down from prior visit (286>281). Compliant with lasix. Doubt CHF exacerbation.  Plan: -Continue lasix 80 mg daily -Continue atenolol and benazepril

## 2015-01-31 NOTE — Telephone Encounter (Signed)
Received a page on the on call pager from [name]. She states that her local Pharmacy, [facility], had not received her Rx for Levaquin and Robitussin that were prescribed to her today.   I informed the patient that I would call her prescriptions in.   [facility].   Levaquin 750mg  PO once daily x5 Robitussin DM 39ml q4hr PRN for cough.  [facility] will [facility] once [facility] are ready.    Blain Pais, MD IMTS, PGY3 01/31/15, 7:56 PM

## 2015-01-31 NOTE — Telephone Encounter (Signed)
Sounds good, if she is not able to make it to the clinic today, she should call 911 and go to the ER.

## 2015-01-31 NOTE — Assessment & Plan Note (Signed)
BP Readings from Last 3 Encounters:  01/31/15 164/66  01/09/15 212/91  10/19/14 143/75    Lab Results  Component Value Date   NA 145 10/05/2014   K 3.9 10/05/2014   CREATININE 0.66 10/05/2014    Assessment: Blood pressure control:  Uncontrolled Progress toward BP goal:   Improved from prior but deteriorated from baseline control Comments: patient did not take medicines this morning since she was feeling poorly  Plan: Medications:  continue current medications Other plans: Return 2/25 for BP recheck

## 2015-02-01 LAB — BASIC METABOLIC PANEL WITH GFR
BUN: 13 mg/dL (ref 6–23)
CO2: 32 mEq/L (ref 19–32)
Calcium: 8.7 mg/dL (ref 8.4–10.5)
Chloride: 105 mEq/L (ref 96–112)
Creat: 0.68 mg/dL (ref 0.50–1.10)
GFR, Est African American: >89 mL/min
GFR, Est Non African American: >89 mL/min
Glucose, Bld: 111 mg/dL — ABNORMAL HIGH (ref 70–99)
Potassium: 3.8 mEq/L (ref 3.5–5.3)
Sodium: 145 mEq/L (ref 135–145)

## 2015-02-01 LAB — CBC WITH DIFFERENTIAL/PLATELET
Basophils Absolute: 0 10*3/uL (ref 0.0–0.1)
Basophils Relative: 0 % (ref 0–1)
Eosinophils Absolute: 0.1 10*3/uL (ref 0.0–0.7)
Eosinophils Relative: 1 % (ref 0–5)
HCT: 44.4 % (ref 36.0–46.0)
Hemoglobin: 14.6 g/dL (ref 12.0–15.0)
Lymphocytes Relative: 41 % (ref 12–46)
Lymphs Abs: 3.2 10*3/uL (ref 0.7–4.0)
MCH: 30.9 pg (ref 26.0–34.0)
MCHC: 32.9 g/dL (ref 30.0–36.0)
MCV: 94.1 fL (ref 78.0–100.0)
Monocytes Absolute: 0.4 10*3/uL (ref 0.1–1.0)
Monocytes Relative: 5 % (ref 3–12)
Neutro Abs: 4.1 10*3/uL (ref 1.7–7.7)
Neutrophils Relative %: 53 % (ref 43–77)
Platelets: 144 10*3/uL — ABNORMAL LOW (ref 150–400)
RBC: 4.72 MIL/uL (ref 3.87–5.11)
RDW: 14.7 % (ref 11.5–15.5)
WBC: 7.7 10*3/uL (ref 4.0–10.5)

## 2015-02-14 ENCOUNTER — Encounter: Payer: Self-pay | Admitting: Internal Medicine

## 2015-02-14 ENCOUNTER — Telehealth: Payer: Self-pay | Admitting: Internal Medicine

## 2015-02-14 NOTE — Telephone Encounter (Signed)
Call to patient to confirm appointment for 02/15/15 at 10:15.[email or web address]

## 2015-02-14 NOTE — Addendum Note (Signed)
Addended by: Hulan Fray on: 02/14/2015 05:42 PM   Modules accepted: Orders

## 2015-02-15 ENCOUNTER — Encounter: Payer: Self-pay | Admitting: Internal Medicine

## 2015-02-15 ENCOUNTER — Encounter: Payer: Medicare Other | Admitting: Internal Medicine

## 2015-02-15 ENCOUNTER — Ambulatory Visit (INDEPENDENT_AMBULATORY_CARE_PROVIDER_SITE_OTHER): Payer: Medicare Other | Admitting: Internal Medicine

## 2015-02-15 VITALS — BP 154/75 | HR 66 | Temp 98.2°F

## 2015-02-15 DIAGNOSIS — I10 Essential (primary) hypertension: Secondary | ICD-10-CM | POA: Diagnosis not present

## 2015-02-15 DIAGNOSIS — L723 Sebaceous cyst: Secondary | ICD-10-CM

## 2015-02-15 DIAGNOSIS — IMO0002 Reserved for concepts with insufficient information to code with codable children: Secondary | ICD-10-CM

## 2015-02-15 DIAGNOSIS — G894 Chronic pain syndrome: Secondary | ICD-10-CM

## 2015-02-15 DIAGNOSIS — E1159 Type 2 diabetes mellitus with other circulatory complications: Secondary | ICD-10-CM

## 2015-02-15 DIAGNOSIS — L304 Erythema intertrigo: Secondary | ICD-10-CM

## 2015-02-15 DIAGNOSIS — E1151 Type 2 diabetes mellitus with diabetic peripheral angiopathy without gangrene: Secondary | ICD-10-CM

## 2015-02-15 DIAGNOSIS — R1032 Left lower quadrant pain: Secondary | ICD-10-CM | POA: Diagnosis not present

## 2015-02-15 DIAGNOSIS — E1165 Type 2 diabetes mellitus with hyperglycemia: Secondary | ICD-10-CM

## 2015-02-15 MED ORDER — NYSTATIN 100000 UNIT/GM EX POWD: Freq: Three times a day (TID) | CUTANEOUS | Status: DC

## 2015-02-15 NOTE — Patient Instructions (Addendum)
General Instructions: -Start using Nystatin powder three times per day for the rash. Keep the rash area dry.  -Check your blood pressure at home at least once per day. If your numbers are higher than 140/90 every day please call us to make an appointment.  -Follow up with Dr. Hilarie Fredrickson, at [facility] gastroenterology if your left abdomen pain is getting worse.  -Follow up with [place] in 3 months or sooner as needed.   Please bring your medicines with you each time you come to clinic.  Medicines may include prescription medications, over-the-counter medications, herbal remedies, eye drops, vitamins, or other pills.   Progress Toward Treatment Goals:  Treatment Goal 01/09/2015  Hemoglobin A1C unable to assess  Blood pressure deteriorated  Stop smoking smoking the same amount  Prevent falls unable to assess    Self Care Goals & Plans:  Self Care Goal 02/15/2015  Manage my medications take my medicines as prescribed; bring my medications to every visit; refill my medications on time; follow the sick day instructions if I am sick  Monitor my health keep track of my blood glucose; keep track of my blood pressure; keep track of my weight; check my feet daily  Eat healthy foods eat more vegetables; eat fruit for snacks and desserts; eat smaller portions  Be physically active find an activity I enjoy  Stop smoking -  Prevent falls -  Meeting treatment goals -    Home Blood Glucose Monitoring 01/09/2015  Check my blood sugar 3 times a day  When to check my blood sugar before meals     Care Management & Community Referrals:  Referral 01/09/2015  Referrals made for care management support none needed  Referrals made to community resources -

## 2015-02-16 NOTE — Assessment & Plan Note (Signed)
Lab Results  Component Value Date   HGBA1C 7.8 01/09/2015   HGBA1C 8.2 10/05/2014   HGBA1C 7.4* 05/03/2014     Assessment: Diabetes control:  not controlled Progress toward A1C goal:   close to goal Comments: on metformin 1000mg  BID and Lispro quikpen 70-135 uni BID ac.  Plan: Medications:  continue current medications, no CBG meter during this visit Home glucose monitoring: Frequency:   Timing:   Instruction/counseling given: reminded to get eye exam Educational resources provided: brochure, handout Self management tools provided:   Other plans: Follow up in 3 months with PCP

## 2015-02-16 NOTE — Assessment & Plan Note (Signed)
She had colonoscopy in 2012 that showed diverticulosis, her pain is very suspicious for diverticular pain. She was advised to follow up with Dr. Hilarie Fredrickson if her pain persists or worsens.

## 2015-02-16 NOTE — Progress Notes (Signed)
   Subjective:    [name], female    DOB: 10-Oct-1961, 54 y.o.   MRN: [id number]  HPI [name] is a 54 yr old woman with PMH of HTN, DM2, presenting for follow up visit for her BP. She also had a sebaceous cyst and had been referred to Gen Surgery but she states that the cyst has shrunk in size and she decided not to see a Psychologist, sport and exercise.  She complains of LLQ pain off and on for the past 6 months. She is not sure if it is associated with foods. The pain is not always intense and not present everyday. She denies melena or hematochezia.  She also complains of a rash in her left groin area that is red and pruritic.  Since her last visit at the [facility] her respiratory symptoms have almost resolved, she still has a mild non productive cough.  She requests refills for her pain medication per her medication contract agreement.    Review of Systems  Constitutional: Negative for fever, chills, diaphoresis, activity change, appetite change, fatigue and unexpected weight change.  HENT: Negative for congestion.   Respiratory: Positive for cough. Negative for shortness of breath and wheezing.   Cardiovascular: Negative for chest pain, palpitations and leg swelling.  Gastrointestinal: Positive for abdominal pain. Negative for nausea, vomiting, diarrhea, constipation, blood in stool and abdominal distention.  Genitourinary: Negative for dysuria.  Musculoskeletal: Positive for back pain and arthralgias.  Skin: Positive for rash. Negative for color change.  Neurological: Negative for dizziness and light-headedness.  Psychiatric/Behavioral: Negative for agitation.       Objective:   Physical Exam  Constitutional: She is oriented to person, place, and time. She appears well-developed and well-nourished. No distress.  Sitting in wheelchair   Cardiovascular: Normal rate.   Pulmonary/Chest: Effort normal and breath sounds normal. No respiratory distress. She has no wheezes. She has no rales.    Abdominal: Soft. Bowel sounds are normal. She exhibits no distension. There is no tenderness. There is no rebound and no guarding.  Musculoskeletal: She exhibits no edema.  Neurological: She is alert and oriented to person, place, and time.  Skin: Skin is warm and dry. She is not diaphoretic.  erythematous rash with no pustules noted on the left groin fold under panus with no skin break, no discharge.  There is a 1cm soft nodular area at the left inner upper thigh with no edema/erythema/TTP/drainage, per pt this was much larger before  Psychiatric: She has a normal mood and affect.  Nursing note and vitals reviewed.         Assessment & Plan:

## 2015-02-16 NOTE — Assessment & Plan Note (Addendum)
Of left groin/panus.  Rx Nystatin powder TID, pt advised to keep area dry

## 2015-02-16 NOTE — Assessment & Plan Note (Signed)
Per her PCP's request (Dr. 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

## 2015-02-16 NOTE — Assessment & Plan Note (Signed)
This seemed to have improved on its on. She will call us if it gets worse for surgery referral.

## 2015-02-16 NOTE — Assessment & Plan Note (Signed)
BP Readings from Last 3 Encounters:  02/15/15 154/75  01/31/15 164/66  01/09/15 212/91    Lab Results  Component Value Date   NA 145 01/31/2015   K 3.8 01/31/2015   CREATININE 0.68 01/31/2015    Assessment: Blood pressure control:  not controlled Progress toward BP goal:   not at goal Comments: She was not sure if she had taken her medications. On Norvasc 10mg  daily, atenolol 100mg  daily, Lotensin 40mg  daily, Lasix 80mg  daily.   Plan: Medications:  continue current medications Educational resources provided: brochure, handout, video Self management tools provided:   Other plans: She was advised to check her BP at home (has monitor at home) and call us if her BP is persistently above 140/90, otherwise, follow up in 3 months (it is difficult for her to come to appointments due to transportation. She was given BP log sheet.

## 2015-02-19 ENCOUNTER — Other Ambulatory Visit: Payer: Self-pay | Admitting: Internal Medicine

## 2015-02-19 NOTE — Progress Notes (Signed)
Internal Medicine Clinic Attending  Case discussed with Dr. Kennerly soon after the resident saw the patient.  We reviewed the resident's history and exam and pertinent patient test results.  I agree with the assessment, diagnosis, and plan of care documented in the resident's note.  

## 2015-03-21 ENCOUNTER — Other Ambulatory Visit: Payer: Self-pay | Admitting: Internal Medicine

## 2015-03-22 NOTE — Telephone Encounter (Signed)
Rx called in [place] desk to schedule appointment

## 2015-03-22 NOTE — Telephone Encounter (Signed)
May or June appt

## 2015-03-26 ENCOUNTER — Ambulatory Visit (HOSPITAL_COMMUNITY)
Admission: RE | Admit: 2015-03-26 | Discharge: 2015-03-26 | Disposition: A | Discharge status: Home / Self Care | Payer: Medicare Other | Source: Ambulatory Visit | Attending: Oncology | Admitting: Oncology

## 2015-03-26 ENCOUNTER — Other Ambulatory Visit: Payer: Self-pay | Admitting: Internal Medicine

## 2015-03-26 DIAGNOSIS — Z1231 Encounter for screening mammogram for malignant neoplasm of breast: Secondary | ICD-10-CM

## 2015-03-26 DIAGNOSIS — Z0000 Encounter for general adult medical examination without abnormal findings: Secondary | ICD-10-CM

## 2015-03-26 LAB — HM MAMMOGRAPHY

## 2015-04-04 ENCOUNTER — Encounter: Payer: Self-pay | Admitting: Internal Medicine

## 2015-04-04 DIAGNOSIS — I700 Atherosclerosis of aorta: Secondary | ICD-10-CM | POA: Insufficient documentation

## 2015-04-09 ENCOUNTER — Other Ambulatory Visit: Payer: Self-pay | Admitting: *Deleted

## 2015-04-09 DIAGNOSIS — G894 Chronic pain syndrome: Secondary | ICD-10-CM

## 2015-04-09 NOTE — Telephone Encounter (Signed)
  Next office appt 5/26.

## 2015-04-10 MED ORDER — OXYCODONE-ACETAMINOPHEN 5-325 MG PO TABS: 2.0000 | ORAL_TABLET | ORAL | Status: DC | PRN

## 2015-04-10 MED ORDER — OXYCODONE-ACETAMINOPHEN 5-325 MG PO TABS
2.0000 | ORAL_TABLET | ORAL | Status: DC | PRN
Start: 2015-04-10 — End: 2015-06-14

## 2015-04-10 NOTE — Telephone Encounter (Signed)
Mupirocin was ordered 2014 by another provider outside of [facility]. Not certain why she needs it  She is not due for percocet until 5/1. I printed off anyway. She needs to keep her May appt with me

## 2015-04-18 ENCOUNTER — Other Ambulatory Visit: Payer: Self-pay | Admitting: Internal Medicine

## 2015-05-16 ENCOUNTER — Other Ambulatory Visit: Payer: Self-pay | Admitting: Internal Medicine

## 2015-05-17 ENCOUNTER — Ambulatory Visit (INDEPENDENT_AMBULATORY_CARE_PROVIDER_SITE_OTHER): Payer: Medicare Other | Admitting: Internal Medicine

## 2015-05-17 ENCOUNTER — Encounter: Payer: Self-pay | Admitting: Licensed Clinical Social Worker

## 2015-05-17 ENCOUNTER — Encounter: Payer: Self-pay | Admitting: Internal Medicine

## 2015-05-17 VITALS — BP 192/68 | HR 68 | Temp 98.1°F | Wt 279.3 lb

## 2015-05-17 DIAGNOSIS — I700 Atherosclerosis of aorta: Secondary | ICD-10-CM | POA: Diagnosis not present

## 2015-05-17 DIAGNOSIS — E1165 Type 2 diabetes mellitus with hyperglycemia: Secondary | ICD-10-CM | POA: Diagnosis not present

## 2015-05-17 DIAGNOSIS — IMO0002 Reserved for concepts with insufficient information to code with codable children: Secondary | ICD-10-CM

## 2015-05-17 DIAGNOSIS — I10 Essential (primary) hypertension: Secondary | ICD-10-CM

## 2015-05-17 DIAGNOSIS — I798 Other disorders of arteries, arterioles and capillaries in diseases classified elsewhere: Secondary | ICD-10-CM

## 2015-05-17 DIAGNOSIS — I5032 Chronic diastolic (congestive) heart failure: Secondary | ICD-10-CM

## 2015-05-17 DIAGNOSIS — M5441 Lumbago with sciatica, right side: Secondary | ICD-10-CM

## 2015-05-17 DIAGNOSIS — L304 Erythema intertrigo: Secondary | ICD-10-CM

## 2015-05-17 DIAGNOSIS — F17200 Nicotine dependence, unspecified, uncomplicated: Secondary | ICD-10-CM

## 2015-05-17 DIAGNOSIS — Z7951 Long term (current) use of inhaled steroids: Secondary | ICD-10-CM

## 2015-05-17 DIAGNOSIS — G4700 Insomnia, unspecified: Secondary | ICD-10-CM

## 2015-05-17 DIAGNOSIS — Z794 Long term (current) use of insulin: Secondary | ICD-10-CM

## 2015-05-17 DIAGNOSIS — M5442 Lumbago with sciatica, left side: Principal | ICD-10-CM

## 2015-05-17 DIAGNOSIS — Z89512 Acquired absence of left leg below knee: Secondary | ICD-10-CM

## 2015-05-17 DIAGNOSIS — E1151 Type 2 diabetes mellitus with diabetic peripheral angiopathy without gangrene: Secondary | ICD-10-CM | POA: Diagnosis not present

## 2015-05-17 DIAGNOSIS — J449 Chronic obstructive pulmonary disease, unspecified: Secondary | ICD-10-CM

## 2015-05-17 DIAGNOSIS — I509 Heart failure, unspecified: Secondary | ICD-10-CM

## 2015-05-17 DIAGNOSIS — J42 Unspecified chronic bronchitis: Secondary | ICD-10-CM

## 2015-05-17 DIAGNOSIS — G894 Chronic pain syndrome: Secondary | ICD-10-CM

## 2015-05-17 LAB — GLUCOSE, CAPILLARY: Glucose-Capillary: 326 mg/dL — ABNORMAL HIGH (ref 65–99)

## 2015-05-17 LAB — POCT GLYCOSYLATED HEMOGLOBIN (HGB A1C): Hemoglobin A1C: 7.7

## 2015-05-17 MED ORDER — NYSTATIN 100000 UNIT/GM EX POWD: Freq: Three times a day (TID) | CUTANEOUS | Status: DC

## 2015-05-17 MED ORDER — AMLODIPINE BESYLATE 10 MG PO TABS: 5.0000 mg | ORAL_TABLET | Freq: Every day | ORAL | Status: DC

## 2015-05-17 NOTE — Progress Notes (Signed)
Ms. Finfrock is known to this worker.  Housing was an issue for pt in 2015, however, Ms. Messenger's son was living with her and pt was optimistic she would be able to manage stairs once she received her prosthesis.  At this time, Ms. Goeden states she can not use her walker and manage the steps while having a purse/medication bag or holding anything else.  Pt would like to find housing that is handicap accessible.  Ms. Laidlaw has spoken with her [employer] worker and was told if she found something available she could move.  Pt currently pays $250+electric.  CSW utilized [employer] to find available w/c accessible  housing within pt's current price range; all were currently wait listed.  CSW adjusted parameters to $350, some apartments were currently available.  CSW provided both lists to [name].  Pt denies add'l social work issues at this time.  Ms. Sievert notified CSW is available to assist as needed.

## 2015-05-17 NOTE — Patient Instructions (Signed)
See me in 2 months I am ordering an MRI of your back

## 2015-05-20 NOTE — Assessment & Plan Note (Signed)
Seen on Ct 2007. 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 Cont RF mgmt.

## 2015-05-20 NOTE — Assessment & Plan Note (Signed)
She cont to smoke and increased her use to 1 PPD after deaths in her family (mother, sister, fiance). She is on Advair, atrovent neb PRN, and albuterol (uing 4-5 per day but states not QD). She wanted pain to be her primary focus today so I did not get to get more info today. I need to sch an appt and tell her we are not going to discuss her pain at all so I can discuss other issues. Sent message to [place] to sch.

## 2015-05-20 NOTE — Assessment & Plan Note (Signed)
Uncontrolled. She is going to return in June to discuss.   BP Readings from Last 3 Encounters:  05/17/15 192/68  02/15/15 154/75  01/31/15 164/66

## 2015-05-20 NOTE — Assessment & Plan Note (Addendum)
She had on her prosthesis today but was in [facility]. She states she is no longer going to rehab but would like referred back bc she is not yet as mobile as she would like. She is not able to stand / walk > 5 min 2/2 back pain. I will make that referral.   Her son is no longer living with her - moved. She has steps up to her front door and apartment is not WC accessible. [name] spoke to her and gave her info. She has to have someone bring down her WC so therefore, not able to enter / exit indep.

## 2015-05-20 NOTE — Assessment & Plan Note (Signed)
Lab Results  Component Value Date   HGBA1C 7.7 05/17/2015   She is on 75/25 and titrated her doses to 140 AM and 85 PM. She brought in her meter but there ar not enough readings to be useful. Trend of A1C is 7.4 - 8.2 - 7.8 - 7.7 today. She does need better control but without meter readings I cannot adjust insulin. She promises to check her CBG more freq for next appt. Other measure are UTD.

## 2015-05-20 NOTE — Assessment & Plan Note (Signed)
She c/o poor sleep. Goes to sleep 12:30 and wakes up 3 AM. Then falls asleep 4:14 until 5 AM then awake 6 AM. Has TV on. Needs sleep hygiene - discuss at appt after June appt. Ask about OSA.

## 2015-05-20 NOTE — Assessment & Plan Note (Signed)
BMI is 44  Wt Readings from Last 3 Encounters:  05/17/15 279 lb 4.8 oz (126.69 kg)  01/31/15 281 lb 1.6 oz (127.506 kg)  01/09/15 286 lb 9.6 oz (130.001 kg)    She is now s/p L BKA and states having trouble walking or standing > 5 min. This will make her weight loss more troublesome. Will refer back to [name] next appt.

## 2015-05-20 NOTE — Assessment & Plan Note (Signed)
She is stating her long standing LBP is increasing in severity She is on oxycodone 5 mg #360 per month along with Baclofen 10mg  #90 and lyrica 200 mg. She states her back pain is uncontrolled and radiates to both legs. Pain is better when she sits down or leans forward. She is unable to stand or walk > 5 min which is interfering with weight loss and improvement in use of prosthesis. Her last lumbar MRI was 2008 and I am repeating as I am concerned for spinal stenosis. The problem is that she is a poor surgical candidate (uncontrolled DM, HTN, tobacco use, poor insight into her illnesses and lack of motivation to improve her lot in life). But maybe with more info, it might motivate her and give her a goal.

## 2015-05-20 NOTE — Assessment & Plan Note (Signed)
She needs more aggressive RF mgmt - DM, HTN, tobacco. Appt in June.

## 2015-05-20 NOTE — Assessment & Plan Note (Signed)
She self titrated down her lasix to 80 QD and doesn't take it some days bc it makes her pee. I wonder how often she does take but but euvolmeic today. Needs better BP control.

## 2015-05-20 NOTE — Progress Notes (Signed)
   Subjective:    [name], female    DOB: 13-Mar-1961, 54 y.o.   MRN: [id number]  HPI  Jennifer [name] is here for DM and pain F/U. Please see the A&P for the status of the pt's chronic medical problems.  Review of Systems  Constitutional: Negative for unexpected weight change.  Respiratory: Positive for cough and shortness of breath.   Cardiovascular: Negative for chest pain.  Musculoskeletal: Positive for myalgias, back pain, arthralgias and gait problem.  Skin: Negative for wound.  Neurological: Positive for weakness.  Psychiatric/Behavioral: Positive for sleep disturbance.       Objective:   Physical Exam  Constitutional: She is oriented to person, place, and time. She appears well-developed and well-nourished. No distress.  HENT:  Head: Normocephalic and atraumatic.  Right Ear: External ear normal.  Left Ear: External ear normal.  Nose: Nose normal.  Eyes: Conjunctivae and EOM are normal.  Musculoskeletal: Normal range of motion.  Neurological: She is alert and oriented to person, place, and time.  Skin: Skin is warm and dry. She is not diaphoretic.  Psychiatric: She has a normal mood and affect. Her behavior is normal. Judgment and thought content normal.          Assessment & Plan:

## 2015-05-24 ENCOUNTER — Telehealth: Payer: Self-pay | Admitting: *Deleted

## 2015-05-24 NOTE — Telephone Encounter (Signed)
Pt called and stated she needs 5 vials of insulin instead of 4 but it looks like pens are ordered, which is correct?

## 2015-05-30 ENCOUNTER — Encounter: Payer: Self-pay | Admitting: *Deleted

## 2015-05-31 ENCOUNTER — Telehealth: Payer: Self-pay | Admitting: Dietician

## 2015-05-31 NOTE — Telephone Encounter (Signed)
Spoke with patient about mailing a blood glucose log for Jennifer [name] to complete starting 4 days prior to her visit on 06-14-15 and also to bring her meter with her to the appointment. She agreed to do this.  CDE will follow up with a call next week to remind patient and answer her questions.   She requested appointment with CDE. She will have to schedule this at another time as no appointment on same day as doctor appointment

## 2015-06-12 ENCOUNTER — Telehealth: Payer: Self-pay | Admitting: Dietician

## 2015-06-12 NOTE — Telephone Encounter (Signed)
Called patient to follow up on blood glucose testing prior to visit on 06/14/15.

## 2015-06-13 ENCOUNTER — Ambulatory Visit (HOSPITAL_COMMUNITY)
Admission: RE | Admit: 2015-06-13 | Discharge: 2015-06-13 | Disposition: A | Discharge status: Home / Self Care | Payer: Medicare Other | Source: Ambulatory Visit | Attending: Internal Medicine | Admitting: Internal Medicine

## 2015-06-13 ENCOUNTER — Other Ambulatory Visit: Payer: Self-pay | Admitting: Internal Medicine

## 2015-06-13 ENCOUNTER — Telehealth: Payer: Self-pay | Admitting: *Deleted

## 2015-06-13 DIAGNOSIS — D1809 Hemangioma of other sites: Secondary | ICD-10-CM | POA: Insufficient documentation

## 2015-06-13 DIAGNOSIS — M5441 Lumbago with sciatica, right side: Secondary | ICD-10-CM

## 2015-06-13 DIAGNOSIS — M5126 Other intervertebral disc displacement, lumbar region: Secondary | ICD-10-CM | POA: Diagnosis not present

## 2015-06-13 DIAGNOSIS — M47816 Spondylosis without myelopathy or radiculopathy, lumbar region: Secondary | ICD-10-CM | POA: Diagnosis not present

## 2015-06-13 DIAGNOSIS — M5442 Lumbago with sciatica, left side: Secondary | ICD-10-CM

## 2015-06-13 DIAGNOSIS — M545 Low back pain: Secondary | ICD-10-CM | POA: Diagnosis present

## 2015-06-13 NOTE — Telephone Encounter (Signed)
Pharmacy calls and states that the bupropion has not been filled since jan 2016, do you want to continue this med for pt? Do you want to d'c it?

## 2015-06-14 ENCOUNTER — Ambulatory Visit (INDEPENDENT_AMBULATORY_CARE_PROVIDER_SITE_OTHER): Payer: Medicare Other | Admitting: Internal Medicine

## 2015-06-14 ENCOUNTER — Encounter: Payer: Self-pay | Admitting: Internal Medicine

## 2015-06-14 ENCOUNTER — Encounter: Payer: Self-pay | Admitting: *Deleted

## 2015-06-14 VITALS — BP 140/80 | HR 60 | Temp 97.6°F | Ht 67.0 in | Wt 282.9 lb

## 2015-06-14 DIAGNOSIS — IMO0002 Reserved for concepts with insufficient information to code with codable children: Secondary | ICD-10-CM

## 2015-06-14 DIAGNOSIS — E1165 Type 2 diabetes mellitus with hyperglycemia: Secondary | ICD-10-CM

## 2015-06-14 DIAGNOSIS — E1151 Type 2 diabetes mellitus with diabetic peripheral angiopathy without gangrene: Secondary | ICD-10-CM | POA: Diagnosis not present

## 2015-06-14 DIAGNOSIS — Z7951 Long term (current) use of inhaled steroids: Secondary | ICD-10-CM

## 2015-06-14 DIAGNOSIS — F17210 Nicotine dependence, cigarettes, uncomplicated: Secondary | ICD-10-CM

## 2015-06-14 DIAGNOSIS — I10 Essential (primary) hypertension: Secondary | ICD-10-CM

## 2015-06-14 DIAGNOSIS — I798 Other disorders of arteries, arterioles and capillaries in diseases classified elsewhere: Secondary | ICD-10-CM

## 2015-06-14 DIAGNOSIS — K219 Gastro-esophageal reflux disease without esophagitis: Secondary | ICD-10-CM

## 2015-06-14 DIAGNOSIS — Z89512 Acquired absence of left leg below knee: Secondary | ICD-10-CM

## 2015-06-14 DIAGNOSIS — F32A Depression, unspecified: Secondary | ICD-10-CM

## 2015-06-14 DIAGNOSIS — I5032 Chronic diastolic (congestive) heart failure: Secondary | ICD-10-CM

## 2015-06-14 DIAGNOSIS — J42 Unspecified chronic bronchitis: Secondary | ICD-10-CM

## 2015-06-14 DIAGNOSIS — J449 Chronic obstructive pulmonary disease, unspecified: Secondary | ICD-10-CM

## 2015-06-14 DIAGNOSIS — Z7189 Other specified counseling: Secondary | ICD-10-CM

## 2015-06-14 DIAGNOSIS — E785 Hyperlipidemia, unspecified: Secondary | ICD-10-CM

## 2015-06-14 DIAGNOSIS — F5104 Psychophysiologic insomnia: Secondary | ICD-10-CM

## 2015-06-14 DIAGNOSIS — Z6841 Body Mass Index (BMI) 40.0 and over, adult: Secondary | ICD-10-CM

## 2015-06-14 DIAGNOSIS — G894 Chronic pain syndrome: Secondary | ICD-10-CM

## 2015-06-14 DIAGNOSIS — Z794 Long term (current) use of insulin: Secondary | ICD-10-CM

## 2015-06-14 DIAGNOSIS — Z720 Tobacco use: Secondary | ICD-10-CM

## 2015-06-14 DIAGNOSIS — F329 Major depressive disorder, single episode, unspecified: Secondary | ICD-10-CM

## 2015-06-14 MED ORDER — ROSUVASTATIN CALCIUM 20 MG PO TABS: 20.0000 mg | ORAL_TABLET | Freq: Every day | ORAL | Status: DC

## 2015-06-14 MED ORDER — OXYCODONE-ACETAMINOPHEN 5-325 MG PO TABS: 2.0000 | ORAL_TABLET | ORAL | Status: DC | PRN

## 2015-06-14 MED ORDER — RANITIDINE HCL 150 MG PO TABS: 150.0000 mg | ORAL_TABLET | Freq: Two times a day (BID) | ORAL | Status: DC

## 2015-06-14 MED ORDER — ATORVASTATIN CALCIUM 80 MG PO TABS: 80.0000 mg | ORAL_TABLET | Freq: Every day | ORAL | Status: DC

## 2015-06-14 MED ORDER — SERTRALINE HCL 50 MG PO TABS: 50.0000 mg | ORAL_TABLET | Freq: Every day | ORAL | Status: DC

## 2015-06-14 NOTE — Assessment & Plan Note (Signed)
She is on advair, atrovent nev, alb neb, and albuterol MDI. She uses the alb PRN, some days up to 4 times bc she gets SOB when active inc talking. She said our talking was making her SOB and got out and used the MDI. However, after admin the med, she immediately started talking, thereby exhaling all the med. Otherwise, her technique was OK. I educated her on proper MDI technique. Her DOE is likely multifactorial - obesity, deconditioning, obstruction. She has had PFT's in 2008 that did not meet criteria for COPD but did show sig improvement to BD. She has cont to smoke and is willing to repeat the PFT's.    PLAN PFT Cont meds Technique education Support on smoking cessation

## 2015-06-14 NOTE — Assessment & Plan Note (Signed)
We addressed RF and attempting to control RF today. See each indep RF documentation.

## 2015-06-14 NOTE — Patient Instructions (Signed)
1. Take the wellbutrin once a day for 1 week then stop. Start the zoloft immediately 2. Stop the nexium and try the zantac 3. Stop the pravastatin and start the crestor. 4. Talk to your son about your wishes at end of life.

## 2015-06-14 NOTE — Assessment & Plan Note (Signed)
She goes to bed at 4 AM ish. Falls asleep btw 5 and 6 AM. Sleeps until 11 AM to 1 PM. Wakes up , eats. Goes back to bed and sleeps until 6 or 7 Pm. Wakes up and eats dinner. She states she either is sleeping too much or can;'t sleep at all. States occ goes 72 hrs without sleep - most recently one month ago. No mania assoc with this. She is getting more than enough sleep but during day rather than at night. She is OK with this bc she has "nothing else to do." During end of visit, she nodded off while we were talking. She denies AM HA, snoring. Does fall asleep easily. At this point, I think OSA is possible but less likely than disordered sleeping an depression. Additionally, she does not want to get a sleep study. I forgot to give her hand out on sleep hygiene but at this point, may not be helpful 2/2 depression.   PLAN Tx depression AVOID Lorrin Mais

## 2015-06-14 NOTE — Assessment & Plan Note (Signed)
We reviewed her weight curve. No sig change since 2003 (but lost leg). However, she states she knows she has gained weight bc her 2003 clothes do not fit. Cont depression then work on other issues.

## 2015-06-14 NOTE — Assessment & Plan Note (Signed)
BP has always been sky high but never time to discuss 2/2 pain issues. She is on Norvasc 10, Atenolol 100, benazepril 40, and lasix 80 QD. Dr Maudie Mercury had queried pharmacy and she is quite compliant at getting her meds. But it is a delivery pharmacy. Regardless, her repeat BP was acceptable. Not great but if I expand meds, it would be hydral and a TID med which is not great. She has Personnel officer coming this week for med check although seems quite reliable and knowledable about her meds.   PLAN : Cont current meds. In future, always repeat BP.  BP Readings from Last 3 Encounters:  06/14/15 140/80  05/17/15 192/68  02/15/15 154/75

## 2015-06-14 NOTE — Telephone Encounter (Signed)
Would you pls phone in lyrica?

## 2015-06-14 NOTE — Assessment & Plan Note (Signed)
She states she is taking the wellbutrin QD bc it had "stockpiled" while she was in hospital and in rehab. It is "not working" and she wants to try something else. She feels sad, does not enjoy activities, does not do anything except MD, church, and groceries (father takes her). She meets the depression criteria. Has: Depressed mood most the day, nearly every day Loss of interest or pleasure in most or all activities, nearly every day Insomnia or hypersomnia nearly every day Fatigue or low energy, nearly every day Thoughts of worthlessness or excessive or inappropriate guilt, nearly every day  This is chronic - for months, mostly since BKA. She has no SI and no mania. It is impacting her life and medical issues. Wellbutrin failed.  ASSESSMENT : major depression, uncontrolled. ? Adjustment d/o  PLAN Wellbutrin 150 QD for one week then stop Zoloft 50 QD now  Reassess 1 month Refer to [facility] for counseling.

## 2015-06-14 NOTE — Assessment & Plan Note (Signed)
We spent about 20 min discussing this. Lost mother, sister, and fiance recently. Mother wouldn't discuss end of life issues with [place]. She has two sons - Jennifer [name] the younger in [place] the older. Her father is alive and is a Company secretary and she goes to his church. He takes her shopping for groceries.   Started with Medical POA. She would want Jennifer [name] to be her medical POA. I discussed how important it is for her to discuss her wishes with him now. Used example of how difficult it was with her and her mother not wanting to discuss. She wants to be cremated but [employer] like this idea.   Went on to hypotheticals. If stroke and couldn't walk / talk / eat and would need SNF, she said just let her die. She hated the rehab she went to bc she was too young to be in there, it smelled, was noisy, etc. But later, stated that she disagreed with her mother's DNR and would want to be "kept alive." That god would determine her life and death. If PNA and needed vent and could return home - yes, do everything. I think she disagreed with her mother's DNR and that is upsetting / concerning to her and colouring her current decisions.   PLAN Discuss formal medical POA next appt Cont discussion

## 2015-06-14 NOTE — Telephone Encounter (Signed)
She has appt today.

## 2015-06-14 NOTE — Assessment & Plan Note (Signed)
Smoking 1 PPD. Not getting benefit with wellbutrin. Stop med. Work on depression first and then reassess.

## 2015-06-14 NOTE — Assessment & Plan Note (Signed)
She is taking the PPI PRN. Has seen the issue about possible SE with PPI. Therefore, she is agreeable to changing to H2B PRN. If this change is not successful, I will be happy to resume PPI.

## 2015-06-14 NOTE — Assessment & Plan Note (Signed)
Not checking weight at home but does have scale. Taking the lasix on days she can stay at home. Appears euvolemic at this time. BP mod controlled. Cont current meds, Encourage daily weight to keep out of hospital.

## 2015-06-14 NOTE — Assessment & Plan Note (Addendum)
This was the last thing we discussed today bc I wanted to have time to discuss the other issues. We reviewed MRI findings. I see no surgical or intervention fix. She understands the contribution of her weight to the pain. I think her depression is playing a LARGE part of her pain and I plan to tx her depression. For her pain, she is on baclofen, lyrica, oxycodone, and trazodone. She states her pain is sig and interfering with her life. The goal would be to titrate down / off some of these meds as they all interfere with each other and can contribute to her inertia. Today was not the day though. The meds though, esp the opioid, do not seem to be positively contributing to her life - she states she does little other than church, MD appt, and grocery shopping. Otherwise, stays in home.   We discussed pain therapy referral or PT. She opted for PT and out of home PT. Maybe core muscle strengthening might help her pain. I printed off and handed her three paper copies of her oxycodone, observed by [facility].

## 2015-06-14 NOTE — Progress Notes (Signed)
   Subjective:    [name], female    DOB: 1961/03/21, 54 y.o.   MRN: [id number]  HPI  FADIA MARLAR is here for HTN F/U. Please see the A&P for the status of the pt's chronic medical problems. I asked her to come for a non pain visit as we run out of time to discuss non pain issues. I spent 1 hour face to face with Ms Fees, 20 min or so dealing with end of life issues / discussion,  Review of Systems  Constitutional: Negative for fever, appetite change and unexpected weight change.  Respiratory: Positive for shortness of breath.   Cardiovascular: Negative for chest pain and leg swelling.  Gastrointestinal: Negative for diarrhea.  Endocrine: Positive for polyuria.  Genitourinary: Negative for difficulty urinating.  Musculoskeletal: Positive for myalgias, back pain, arthralgias and gait problem.  Skin: Negative for wound.  Neurological: Negative for headaches.  Psychiatric/Behavioral: Positive for sleep disturbance and dysphoric mood. Negative for suicidal ideas.       Objective:   Physical Exam  Constitutional: She appears well-developed and well-nourished. No distress.  Obese, chronically ill appearing  HENT:  Head: Normocephalic and atraumatic.  Right Ear: External ear normal.  Left Ear: External ear normal.  Eyes: Conjunctivae and EOM are normal.  Neurological: She is alert.  Skin: Skin is warm. She is not diaphoretic.  Psychiatric: She has a normal mood and affect. Her behavior is normal. Judgment and thought content normal.  Tearful at times          Assessment & Plan:

## 2015-06-14 NOTE — Assessment & Plan Note (Signed)
She now lives alone since her son moved to [place]. She has one bedroom apartment but needs assistance to enter / leave apartment as cannot take her WC outdoors 2/2 steps. Used her walker to go from apt to SCAT today. But cannot walk long distances with her walked and relies on [facility] - her own or hospitals. She has lost all independence now that she is s/p L BKA and her prosthesis is "worthless' bc she cannot stand walk more than 5 min 2/2 low back pain. Edwena Blow talked with her about [place] housing but she has not gotten far into the process and states money is an issue.   I explained that I wanted appt to day to discuss manageable RF to control RF to prevent R amputation.

## 2015-06-14 NOTE — Assessment & Plan Note (Signed)
LDL is OK at 81 on prava 40. I cal her 10 yr risk as 37 - 67% based on calculator. Therefore, I discussed changing her to high potency statin and she agreed. Stop prava and start lipitor 80. If SE, can decrease to 40.

## 2015-06-14 NOTE — Assessment & Plan Note (Signed)
She goes to bed at 4 AM ish. Falls asleep btw 5 and 6 AM. Sleeps until 11 AM to 1 PM. Wakes up , eats, and takes 140 units 70/30. Goes back to bed and sleeps until 6 or 7 Pm. Wakes up and eats dinner and takes 80 units. But also states takes a snack btw 4 or 5 PM and covers herself with 30 - 60 units depending on CBG.  Lab Results  Component Value Date   HGBA1C 7.7 05/17/2015   Despite being asked to check CBG frq, she is only checking it once a day, randomly. Aver is 221, most high, but some 116. I do not see benefit of changing insulin now - irregular sleeping, eating, and more importantly, depression. I would prefer to target the depression and then if some improvement, target DM.   Assessment : poorly controlled DM II 2/2 depression, erratic eating  Plan : tx depression. Keep insulin the same.

## 2015-06-18 ENCOUNTER — Other Ambulatory Visit: Payer: Self-pay | Admitting: Internal Medicine

## 2015-06-18 ENCOUNTER — Telehealth: Payer: Self-pay | Admitting: *Deleted

## 2015-06-18 NOTE — Telephone Encounter (Signed)
Jinny Blossom from [place] [phone] - there were 2 chol sent at same  Time - generic Lipitor and generic  Crestor. Talked with Dr [name] d/c Lipitor and stay with generic Crestor. Megan aware at pharmacy. Hilda Blades Meleana Commerford RN 06/18/15 1:30PM

## 2015-06-20 ENCOUNTER — Telehealth: Payer: Self-pay | Admitting: Licensed Clinical Social Worker

## 2015-06-20 NOTE — Telephone Encounter (Signed)
Jennifer [name] was referred to CSW for behavioral health resources.  Pt has [employer] Medicare.  CSW utilized [employer] behavioral health directory: [facility] and [facility].  CSW placed call to [name] to discuss referrals.  At this time, [name] states she has been dealing with her depression for several years.  She can not afford her insurance copayment for behavioral health services, which would be approx $45.  Pt is still making payments on her prothesis and new cost for bed bug extermination ($500).  [name] attends the [facility] amputee support group, but has missed a couple of times.  CSW discussed other free groups available: grief support and depression.  Also, provided information on [facility] - which is out of network but utilizes a sliding fee scale.  [name] interested in additional support groups and [facility].  CSW will send information in the mail.  [name] aware CSW is available to assist as needed.

## 2015-06-26 ENCOUNTER — Encounter: Payer: Self-pay | Admitting: Internal Medicine

## 2015-06-26 DIAGNOSIS — F1120 Opioid dependence, uncomplicated: Secondary | ICD-10-CM | POA: Insufficient documentation

## 2015-06-28 ENCOUNTER — Ambulatory Visit (HOSPITAL_COMMUNITY)
Admission: RE | Admit: 2015-06-28 | Discharge: 2015-06-28 | Disposition: A | Discharge status: Home / Self Care | Payer: Medicare Other | Source: Ambulatory Visit | Attending: Internal Medicine | Admitting: Internal Medicine

## 2015-06-28 DIAGNOSIS — J42 Unspecified chronic bronchitis: Secondary | ICD-10-CM | POA: Diagnosis not present

## 2015-06-28 LAB — PULMONARY FUNCTION TEST
DL/VA % pred: 130 %
DL/VA: 6.42 ml/min/mmHg/L
DLCO unc % pred: 89 %
DLCO unc: 22.91 ml/min/mmHg
FEF 25-75 Post: 1.03 L/sec
FEF 25-75 Pre: 0.81 L/sec
FEF2575-%Change-Post: 27 %
FEF2575-%Pred-Post: 45 %
FEF2575-%Pred-Pre: 35 %
FEV1-%Change-Post: 5 %
FEV1-%Pred-Post: 55 %
FEV1-%Pred-Pre: 52 %
FEV1-Post: 1.27 L
FEV1-Pre: 1.2 L
FEV1FVC-%Change-Post: -2 %
FEV1FVC-%Pred-Pre: 91 %
FEV6-%Change-Post: 8 %
FEV6-%Pred-Post: 63 %
FEV6-%Pred-Pre: 58 %
FEV6-Post: 1.76 L
FEV6-Pre: 1.62 L
FEV6FVC-%Change-Post: 0 %
FEV6FVC-%Pred-Post: 103 %
FEV6FVC-%Pred-Pre: 102 %
FVC-%Change-Post: 8 %
FVC-%Pred-Post: 61 %
FVC-%Pred-Pre: 56 %
FVC-Post: 1.76 L
FVC-Pre: 1.63 L
Post FEV1/FVC ratio: 72 %
Post FEV6/FVC ratio: 100 %
Pre FEV1/FVC ratio: 74 %
Pre FEV6/FVC Ratio: 100 %
RV % pred: 100 %
RV: 1.96 L
TLC % pred: 82 %
TLC: 4.27 L

## 2015-06-28 MED ORDER — ALBUTEROL SULFATE (2.5 MG/3ML) 0.083% IN NEBU
2.5000 mg | INHALATION_SOLUTION | Freq: Once | RESPIRATORY_TRACT | Status: AC
  Administered 2015-06-28: 2.5 mg via RESPIRATORY_TRACT

## 2015-07-04 ENCOUNTER — Ambulatory Visit: Payer: Medicare Other | Admitting: Physical Therapy

## 2015-07-10 ENCOUNTER — Other Ambulatory Visit: Payer: Self-pay | Admitting: Internal Medicine

## 2015-07-11 NOTE — Telephone Encounter (Signed)
Called pharm, they state they never rec'd script, given verbally to pharmacist

## 2015-07-11 NOTE — Telephone Encounter (Signed)
4 month supply ordered 06/14/15.

## 2015-07-13 ENCOUNTER — Other Ambulatory Visit: Payer: Self-pay | Admitting: Internal Medicine

## 2015-07-23 ENCOUNTER — Ambulatory Visit: Payer: Medicare Other | Admitting: Physical Therapy

## 2015-07-23 ENCOUNTER — Ambulatory Visit: Payer: Medicare Other | Attending: Internal Medicine

## 2015-07-23 DIAGNOSIS — R269 Unspecified abnormalities of gait and mobility: Secondary | ICD-10-CM | POA: Insufficient documentation

## 2015-07-23 DIAGNOSIS — M792 Neuralgia and neuritis, unspecified: Secondary | ICD-10-CM | POA: Insufficient documentation

## 2015-07-23 DIAGNOSIS — M545 Low back pain: Secondary | ICD-10-CM | POA: Insufficient documentation

## 2015-07-23 DIAGNOSIS — R6889 Other general symptoms and signs: Secondary | ICD-10-CM | POA: Insufficient documentation

## 2015-07-23 DIAGNOSIS — R531 Weakness: Secondary | ICD-10-CM | POA: Insufficient documentation

## 2015-07-23 DIAGNOSIS — Z89512 Acquired absence of left leg below knee: Secondary | ICD-10-CM | POA: Insufficient documentation

## 2015-07-24 NOTE — Addendum Note (Signed)
Addended by: Hulan Fray on: 07/24/2015 07:42 PM   Modules accepted: Orders

## 2015-08-02 ENCOUNTER — Ambulatory Visit (INDEPENDENT_AMBULATORY_CARE_PROVIDER_SITE_OTHER): Payer: Medicare Other | Admitting: Internal Medicine

## 2015-08-02 ENCOUNTER — Encounter: Payer: Self-pay | Admitting: Internal Medicine

## 2015-08-02 VITALS — BP 157/75 | HR 57 | Temp 98.3°F | Wt 269.1 lb

## 2015-08-02 DIAGNOSIS — F1120 Opioid dependence, uncomplicated: Secondary | ICD-10-CM

## 2015-08-02 DIAGNOSIS — E1159 Type 2 diabetes mellitus with other circulatory complications: Secondary | ICD-10-CM | POA: Diagnosis not present

## 2015-08-02 DIAGNOSIS — J42 Unspecified chronic bronchitis: Secondary | ICD-10-CM

## 2015-08-02 DIAGNOSIS — I10 Essential (primary) hypertension: Secondary | ICD-10-CM | POA: Diagnosis not present

## 2015-08-02 DIAGNOSIS — F329 Major depressive disorder, single episode, unspecified: Secondary | ICD-10-CM

## 2015-08-02 DIAGNOSIS — K219 Gastro-esophageal reflux disease without esophagitis: Secondary | ICD-10-CM

## 2015-08-02 DIAGNOSIS — IMO0002 Reserved for concepts with insufficient information to code with codable children: Secondary | ICD-10-CM

## 2015-08-02 DIAGNOSIS — E1165 Type 2 diabetes mellitus with hyperglycemia: Principal | ICD-10-CM

## 2015-08-02 DIAGNOSIS — E1151 Type 2 diabetes mellitus with diabetic peripheral angiopathy without gangrene: Secondary | ICD-10-CM

## 2015-08-02 DIAGNOSIS — F32A Depression, unspecified: Secondary | ICD-10-CM

## 2015-08-02 LAB — GLUCOSE, CAPILLARY: Glucose-Capillary: 274 mg/dL — ABNORMAL HIGH (ref 65–99)

## 2015-08-02 LAB — POCT GLYCOSYLATED HEMOGLOBIN (HGB A1C): Hemoglobin A1C: 7.9

## 2015-08-02 MED ORDER — SERTRALINE HCL 100 MG PO TABS: 100.0000 mg | ORAL_TABLET | Freq: Every day | ORAL | Status: DC

## 2015-08-02 MED ORDER — OXYCODONE-ACETAMINOPHEN 10-325 MG PO TABS: ORAL_TABLET | ORAL | Status: DC

## 2015-08-02 MED ORDER — SERTRALINE HCL 100 MG PO TABS: 50.0000 mg | ORAL_TABLET | Freq: Every day | ORAL | Status: DC

## 2015-08-02 NOTE — Progress Notes (Signed)
   Subjective:    [name], female    DOB: 14-Mar-1961, 54 y.o.   MRN: [id number]  HPI  Jennifer [name] is here for DM and pain F/U. Please see the A&P for the status of the pt's chronic medical problems.  Review of Systems  Constitutional: Positive for fatigue. Negative for unexpected weight change.  HENT: Positive for rhinorrhea.   Eyes: Positive for itching.  Cardiovascular: Negative for chest pain.  Gastrointestinal:       - GERD sxs  Musculoskeletal: Positive for myalgias, back pain, arthralgias and gait problem.  Skin:       + skin tags  Psychiatric/Behavioral: Positive for sleep disturbance and dysphoric mood.       Objective:   Physical Exam  Constitutional: She appears well-developed and well-nourished. No distress.  HENT:  Head: Normocephalic and atraumatic.  Right Ear: External ear normal.  Left Ear: External ear normal.  Nose: Nose normal.  Eyes: Conjunctivae and EOM are normal.  Cardiovascular: Normal rate, regular rhythm and normal heart sounds.   Pulmonary/Chest: Effort normal and breath sounds normal. No respiratory distress. She has no wheezes.  Neurological: She is alert.  Skin: Skin is warm and dry. She is not diaphoretic.  Multiple tattoos. Skin tags neck.. Acanthosis nigricans. Small 2 mm warty like growth R mid arm.  Psychiatric: She has a normal mood and affect. Her behavior is normal. Judgment and thought content normal.          Assessment & Plan:

## 2015-08-02 NOTE — Assessment & Plan Note (Addendum)
She has been getting #360 oxycodone 5/325 per month. This equals about 6 per day and 3900 mg APAP daily. Additionally, she really is not getting any functional improvement despite this high dose. She again confirms that she mostly stays at home in bed sleeping, smoking, and rarely leaves home. Her dose had been increased after her BKA but she is now 1.5 yr after the BKA. It is time to start to titrate down the dose. First I am changing to 10/325 to limit the APAP dose as smokers are more prone to unintentional OD. And I am limiting her to 5 per day. This is a decrease of her oxycodone from 60 mg daily to 50 mg. She understands that this is a slight decrease, the reasons for decrease, and future plans to cont the slow wean. I offered a referral to pain mgmt if she is unable to tolerate the wean. Plan is to cont the 50 mg oxycodone dawily for three months then try to decrease to 40 mg daily.

## 2015-08-02 NOTE — Assessment & Plan Note (Signed)
We changed her to H2B last appt from her PPI and she remains well. Taking it only PRN.

## 2015-08-02 NOTE — Patient Instructions (Signed)
1.Increase your zoloft to 100 mg once a day 2. Change in percocet  Now 10 mg instead of 5 mg pills  Take up to 5 per day, separated by 4 hrs 3 Skin tag removal Monday 15th

## 2015-08-02 NOTE — Assessment & Plan Note (Signed)
BP Readings from Last 3 Encounters:  08/02/15 157/75  06/14/15 140/80  05/17/15 192/68   She is not well controlled today despite repeat BP. Last visit, her BP was acceptable. Will leave meds as is for now and reassess next appt. Dr Maudie Mercury had checked last appt and she was complaint getting her meds, thereby I assume she takes her meds. She is honest about her lasix though and I doubt she takes it QD bc has trouble getting to toilet.

## 2015-08-02 NOTE — Assessment & Plan Note (Signed)
We changed her wellbutrin to zoloft 50. She states she has noticed no difference. I am increasing it to 100. No SE. I tried to get her into counseling but she could not afford the co-pay.

## 2015-08-02 NOTE — Assessment & Plan Note (Signed)
We reviewed her PFT's from 2016. She has increased her tobacco use to 1.5 PPD from 1 PPD bc she has nothing else to do during the day. She knows she has COPD and needs to stop smoking.

## 2015-08-02 NOTE — Assessment & Plan Note (Signed)
She has poor DM control 2/2 non compliance (falls asleep and then doesn't take her PM dose), erratic eating (see last note), erratic sleeping, not checking CBG's (9 readings last month). Therefore, I am not able to get her controlled.

## 2015-08-06 ENCOUNTER — Ambulatory Visit: Payer: Medicare Other | Admitting: Internal Medicine

## 2015-08-08 ENCOUNTER — Encounter: Payer: Self-pay | Admitting: Internal Medicine

## 2015-08-08 ENCOUNTER — Ambulatory Visit (INDEPENDENT_AMBULATORY_CARE_PROVIDER_SITE_OTHER): Payer: Medicare Other | Admitting: Internal Medicine

## 2015-08-08 ENCOUNTER — Other Ambulatory Visit (HOSPITAL_COMMUNITY)
Admission: RE | Admit: 2015-08-08 | Discharge: 2015-08-08 | Disposition: A | Discharge status: Home / Self Care | Payer: Medicare Other | Source: Ambulatory Visit | Attending: Internal Medicine | Admitting: Internal Medicine

## 2015-08-08 VITALS — BP 152/61 | HR 63 | Temp 97.7°F | Ht 66.0 in | Wt 273.4 lb

## 2015-08-08 DIAGNOSIS — L988 Other specified disorders of the skin and subcutaneous tissue: Secondary | ICD-10-CM

## 2015-08-08 DIAGNOSIS — L83 Acanthosis nigricans: Secondary | ICD-10-CM | POA: Diagnosis not present

## 2015-08-08 DIAGNOSIS — L918 Other hypertrophic disorders of the skin: Secondary | ICD-10-CM

## 2015-08-08 DIAGNOSIS — L821 Other seborrheic keratosis: Secondary | ICD-10-CM | POA: Diagnosis not present

## 2015-08-08 DIAGNOSIS — I10 Essential (primary) hypertension: Secondary | ICD-10-CM

## 2015-08-08 DIAGNOSIS — R2231 Localized swelling, mass and lump, right upper limb: Secondary | ICD-10-CM | POA: Insufficient documentation

## 2015-08-08 NOTE — Assessment & Plan Note (Signed)
Multiple skin tags were removed from the patient's neck and right axilla/breast. Another more warty lesion was removed on her right forearm. This specimen was sent for pathology. The lesion is suspicious for actinic keratosis.  - Shave biopsy sent for pathology

## 2015-08-08 NOTE — Patient Instructions (Signed)
Make sure to take your blood pressure medications at the same time every day! You may have some pain and/or bleeding at the sites where the skin tags were removed. Clean with soap and water and apply bandaid.  General Instructions:   Please bring your medicines with you each time you come to clinic.  Medicines may include prescription medications, over-the-counter medications, herbal remedies, eye drops, vitamins, or other pills.   Progress Toward Treatment Goals:  Treatment Goal 01/09/2015  Hemoglobin A1C unable to assess  Blood pressure deteriorated  Stop smoking smoking the same amount  Prevent falls unable to assess    Self Care Goals & Plans:  Self Care Goal 08/08/2015  Manage my medications take my medicines as prescribed; bring my medications to every visit; refill my medications on time  Monitor my health keep track of my blood glucose; bring my glucose meter and log to each visit  Eat healthy foods drink diet soda or water instead of juice or soda; eat more vegetables; eat foods that are low in salt; eat baked foods instead of fried foods  Be physically active -  Stop smoking cut down the number of cigarettes smoked  Prevent falls -  Meeting treatment goals -    Home Blood Glucose Monitoring 01/09/2015  Check my blood sugar 3 times a day  When to check my blood sugar before meals     Care Management & Community Referrals:  Referral 01/09/2015  Referrals made for care management support none needed  Referrals made to community resources -

## 2015-08-08 NOTE — Assessment & Plan Note (Signed)
Patient admits that she did not take her BP meds yet today. Her BP was mildly elevated at 152/61.  - Will continue current medications for now - Patient encouraged to take her BP meds at the same time every day

## 2015-08-08 NOTE — Progress Notes (Signed)
   Subjective:    [name], female    DOB: May 31, 1961, 54 y.o.   MRN: [id number]  HPI Jennifer [name] is a 54yo woman with PMHx of HTN, Type 2 DM, peripheral vascular disease, COPD, and hyperlipidemia who presents today for skin tag removal.  Patient would like skin tags removed as some have become bothersome. Skin tags are located on both sides of her neck and the back of neck as well as in the right axilla/breast and right forearm near the elbow. The lesion on her right forearm appeared suspicious for actinic keratosis so a shave biopsy was performed and sent for pathology.    Procedure Note: Skin tag removal, shave biopsy Patient was consented for the procedure.  She was prepped appropriately. A topical anesthetic was applied to her neck for removal of multiple skin tags on her right and left neck. Skin tags were removed with surgical scissors. Lidocaine was used to numb the skin tag on the back of her neck. This lesion was removed using a razor. The lesion on her right forearm was numbed by injecting lidocaine under the skin. This was removed using a razor to perform a shave biopsy. Specimens were appropriately collected.   There was minimal bleeding. Patient tolerated the procedure well.    Review of Systems General: Denies fever, chills, night sweats, changes in weight, changes in appetite HEENT: Denies headaches, ear pain, changes in vision, rhinorrhea, sore throat CV: Denies CP, palpitations, SOB, orthopnea Pulm: Denies SOB, cough, wheezing GI: Denies abdominal pain, nausea, vomiting, diarrhea, constipation, melena, hematochezia GU: Denies dysuria, hematuria, frequency Msk: Denies muscle cramps, joint pains Neuro: Denies weakness, numbness, tingling Skin: Denies rashes, bruising    Objective:   Physical Exam General: alert, sitting up in chair, NAD CV: RRR, no m/g/r Pulm: CTA bilaterally, breaths non-labored  Ext: warm, no peripheral edema. Left prosthetic leg.    Neuro: alert and oriented x 3 Skin: Multiple tattoos. Skin tags on neck, right axilla/breast. Small 75mm warty like growth on right mid arm. Acanthosis nigricans present on back of neck.     Assessment & Plan:  Please refer to A&P documentation.

## 2015-08-09 NOTE — Progress Notes (Signed)
I saw and evaluated the patient.  I personally confirmed the key portions of Dr. Shela Commons history and exam and reviewed pertinent patient test results.  The assessment, diagnosis, and plan were formulated together and I agree with the documentation in the resident's note.  I was also present at the patient's side for the skin tag removals and shave biopsies.

## 2015-08-13 ENCOUNTER — Encounter: Payer: Self-pay | Admitting: Physical Therapy

## 2015-08-13 ENCOUNTER — Ambulatory Visit: Payer: Medicare Other | Admitting: Physical Therapy

## 2015-08-13 VITALS — BP 171/93 | HR 119

## 2015-08-13 DIAGNOSIS — R6889 Other general symptoms and signs: Secondary | ICD-10-CM | POA: Diagnosis present

## 2015-08-13 DIAGNOSIS — M545 Low back pain: Secondary | ICD-10-CM

## 2015-08-13 DIAGNOSIS — R531 Weakness: Secondary | ICD-10-CM

## 2015-08-13 DIAGNOSIS — R269 Unspecified abnormalities of gait and mobility: Secondary | ICD-10-CM | POA: Diagnosis not present

## 2015-08-13 DIAGNOSIS — Z89512 Acquired absence of left leg below knee: Secondary | ICD-10-CM

## 2015-08-13 DIAGNOSIS — M792 Neuralgia and neuritis, unspecified: Secondary | ICD-10-CM

## 2015-08-14 ENCOUNTER — Ambulatory Visit (INDEPENDENT_AMBULATORY_CARE_PROVIDER_SITE_OTHER): Payer: Medicare Other | Admitting: Internal Medicine

## 2015-08-14 VITALS — BP 149/58 | HR 60 | Temp 97.7°F | Wt 271.2 lb

## 2015-08-14 DIAGNOSIS — I10 Essential (primary) hypertension: Secondary | ICD-10-CM

## 2015-08-14 DIAGNOSIS — L918 Other hypertrophic disorders of the skin: Secondary | ICD-10-CM

## 2015-08-14 MED ORDER — BACITRACIN-POLYMYXIN B 500-10000 UNIT/GM EX OINT: TOPICAL_OINTMENT | Freq: Two times a day (BID) | CUTANEOUS | Status: DC

## 2015-08-14 NOTE — Patient Instructions (Signed)
Apply polysporin (bacitracin) twice daily to the affected areas If the areas do not get better in the next 1-2 weeks or get more red, swollen, or painful then please come back to clinic  General Instructions:   Please bring your medicines with you each time you come to clinic.  Medicines may include prescription medications, over-the-counter medications, herbal remedies, eye drops, vitamins, or other pills.   Progress Toward Treatment Goals:  Treatment Goal 01/09/2015  Hemoglobin A1C unable to assess  Blood pressure deteriorated  Stop smoking smoking the same amount  Prevent falls unable to assess    Self Care Goals & Plans:  Self Care Goal 08/08/2015  Manage my medications take my medicines as prescribed; bring my medications to every visit; refill my medications on time  Monitor my health keep track of my blood glucose; bring my glucose meter and log to each visit  Eat healthy foods drink diet soda or water instead of juice or soda; eat more vegetables; eat foods that are low in salt; eat baked foods instead of fried foods  Be physically active -  Stop smoking cut down the number of cigarettes smoked  Prevent falls -  Meeting treatment goals -    Home Blood Glucose Monitoring 01/09/2015  Check my blood sugar 3 times a day  When to check my blood sugar before meals     Care Management & Community Referrals:  Referral 01/09/2015  Referrals made for care management support none needed  Referrals made to community resources -

## 2015-08-14 NOTE — Therapy (Signed)
[facility] [address] [place], [place], [address] Phone: [phone]   Fax:  [phone]  Physical Therapy Evaluation  Patient Details  Name: Jennifer [name] MRN: [id number] Date of Birth: 1960-12-24 Referring Provider:  Bartholomew Crews, MD  Encounter Date: 08/13/2015      PT End of Session - 08/14/15 0800    Visit Number 1   Number of Visits 18   Date for PT Re-Evaluation 10/12/15   Authorization Type Medicare G-Code 10th visit   PT Start Time 1515   PT Stop Time 1600   PT Time Calculation (min) 45 min   Equipment Utilized During Treatment Gait belt   Activity Tolerance Patient tolerated treatment well   Behavior During Therapy [facility] for tasks assessed/performed      Past Medical History  Diagnosis Date  . Depression   . GERD (gastroesophageal reflux disease)   . Hyperlipidemia   . Hypertension   . Glaucoma   . PVD (peripheral vascular disease) with claudication     Stents to bilateral common iliac arteries (left 2005, right 2008), on chronic plavix  . Hyperplastic colon polyp 12/2010    Per colonoscopy (12/2010) - Dr. Deatra Ina  . Asthma   . COPD 01/08/2007    PFT's 05/2007 : FEV1/FVC 82, FEV1 64% pred, FEF 25-75% 40% predicted, 16% improvement in FEV1 with bronchodilators.     . Tobacco abuse   . Chronic osteomyelitis of foot     chronic, right secondary to diabetic foot ulcers  . Polymicrobial bacterial infection 01/2013    GBS and S. aureus bacteremia // Source likely infected diabetic foot ulcer  . 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 Blood cultures 01/26/13 S. Aureus and GBS. Blood cultures Feb 6th, 8th, and 9th and March were negative.Repeat TEE 3/20 negative for vegitations  . 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 Chronic diastolic heart failure     grade 2 per 2D echocardiogram (01/2013)  . Ulcer of foot, chronic    Left. No OM per MRI (01/2013)  . DVT of upper extremity (deep vein thrombosis) 03/11/2013    Secondary to PICC line. Right brachial vein, diagnosed on 03/10/2013 Coumadin for 3 months. End date 06/10/2013   . Chronic pain syndrome 12/03/2011    Likely secondary to depression, "fibromyalgia", neuropathy, and obesity. Lumbar MRI 2014 no sig change from prior (2008) : Stable hypertrophic facet disease most notable at L4-5. Stable shallow left foraminal/extraforaminal disc protrusion at L4-5. No direct neural compression.      . Environmental allergies     Hx: of  . Fatty liver 2003    observed on ultrasound abdomen  . CHF (congestive heart failure)   . Anginal pain     '3' of 10 ischemia ruled out 9/9   . Fibromyalgia   . Chronic bronchitis     "I get it alot" (09/28/2013)  . Exertional shortness of breath   . Rheumatoid arthritis   . Arthritis of lumbar spine   . Chronic lower back pain   . Diabetic peripheral neuropathy   . Type II diabetes mellitus with peripheral circulatory disorders, uncontrolled DX: 1993    Insulin dep. Poor control. Complicated by diabetic foot ulcer and diabetic eye disease.    . 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 2/2 tobacco use, DM, and  cont weight bearing on surgical wound and developed gangrene     Past Surgical History  Procedure Laterality Date  . Abdominal hysterectomy  1997    secondary to uterine fibroids  . Tubal ligation    . Shoulder arthroscopy w/ rotator cuff repair Bilateral   . Breast biopsy      multiple-benign per pt  . Wrist surgery Right     "for tumors" (09/28/2013)  . Ganglion cyst excision      multiple  . Other surgical history      stents in lower ext  . Tee without cardioversion N/A 01/31/2013    Procedure: TRANSESOPHAGEAL ECHOCARDIOGRAM (TEE);  Surgeon: Fay Records, MD;  Location: [facility];  Service: Cardiovascular;  Laterality: N/A;  Rm [phone]  .  Bladder surgery      bladder reconstruction surgery  . Tee without cardioversion N/A 03/10/2013    Procedure: TRANSESOPHAGEAL ECHOCARDIOGRAM (TEE);  Surgeon: Larey Dresser, MD;  Location: [facility];  Service: Cardiovascular;  Laterality: N/A;  Rm. [phone]  . Skin split graft Bilateral 05/13/2013    Procedure: Right and Left Foot Allograft Skin Graft;  Surgeon: Newt Minion, MD;  Location: [facility];  Service: Orthopedics;  Laterality: Bilateral;  Right and Left Foot Allograft Skin Graft  . Toe amputation Left 08/31/2013    4TH & 5 TH TOE   . Amputation Left 08/31/2013    Procedure: AMPUTATION RAY;  Surgeon: Newt Minion, MD;  Location: [facility];  Service: Orthopedics;  Laterality: Left;  Left Foot 5th Ray Amputation  . Esophagogastroduodenoscopy N/A 09/20/2013    Procedure: ESOPHAGOGASTRODUODENOSCOPY (EGD);  Surgeon: Jerene Bears, MD;  Location: [facility];  Service: Gastroenterology;  Laterality: N/A;  . Foot amputation through metatarsal Left 09/28/2013  . Tonsillectomy    . Amputation Left 09/28/2013    Procedure: Left Midfoot amputation;  Surgeon: Newt Minion, MD;  Location: [facility];  Service: Orthopedics;  Laterality: Left;  Left Midfoot amputation  . Amputation Left 10/14/2013    Procedure: AMPUTATION BELOW KNEE- left;  Surgeon: Newt Minion, MD;  Location: [facility];  Service: Orthopedics;  Laterality: Left;  Left Below Knee Amputation     Filed Vitals:   08/13/15 1530 08/13/15 1600  BP: 171/93   Pulse: 99 119  SpO2: 94% 93%    Visit Diagnosis:  Abnormality of gait  Status post below knee amputation of left lower extremity  Decreased functional activity tolerance  Midline low back pain, with sciatica presence unspecified  Pain, neuropathic  Weakness generalized      Subjective Assessment - 08/13/15 1524    Subjective This 54yo female has history of RA, uncontrolled DM, HTN, PVD and underwent a left Transtibial Amputation 10/14/2013. She recieved her prosthesis Fall 2015. She  reports daily most of awake hours.   Patient is accompained by: Family member   Patient Stated Goals To use prosthesis to walk further & not have to worry about falling.   Currently in Pain? Yes   Pain Score 7   in last week, worst 10/10, best 5/10   Pain Location Back   Pain Orientation Lower   Pain Descriptors / Indicators Aching;Burning   Pain Type Chronic pain   Pain Onset More than a month ago   Pain Frequency Constant   Aggravating Factors  standing,   Pain Relieving Factors sitting, medications   Effect of Pain on Daily Activities liimits activities   Multiple Pain Sites Yes   Pain Score 5  in last week, worst 10/10, best 5/10   Pain Location Other (Comment)  feet & hands with neuropathy & fibromylalgia   Pain Descriptors / Indicators Burning   Pain Type Chronic pain   Pain Onset More than a month ago   Pain Frequency Constant   Aggravating Factors  weather   Pain Relieving Factors medication            [facility] PT Assessment - 08/13/15 1530    Assessment   Medical Diagnosis Gait Abnormality, hx of BKA, back pain   Precautions   Precautions Fall   Restrictions   Weight Bearing Restrictions No   Balance Screen   Has the patient fallen in the past 6 months Yes   How many times? 1-2 times per week   Has the patient had a decrease in activity level because of a fear of falling?  Yes   Is the patient reluctant to leave their home because of a fear of falling?  Yes   [place] residence   Living Arrangements Children  2 children    Type of [place] to enter   Entrance Stairs-Number of Steps [place] One level   Home Equipment Wheelchair - manual;Walker - 2 wheels;Cane - single point;Bedside commode;Tub bench;Grab bars - toilet   Prior Function   Level of Independence Independent;Independent with household mobility with device;Independent with community mobility with  device  limited gait to car for community   Vocation On disability   Observation/Other Assessments   Focus on Therapeutic Outcomes (FOTO)  23  Functional Status   Fear Avoidance Belief Questionnaire (FABQ)  47   Posture/Postural Control   Posture/Postural Control Postural limitations   Postural Limitations Rounded Shoulders;Forward head;Increased lumbar lordosis;Flexed trunk   ROM / Strength   AROM / PROM / Strength AROM;Strength   AROM   Overall AROM  Deficits   Overall AROM Comments tightness in hip flexors, hamstrings, right heelcord   Strength   Overall Strength Deficits   Overall Strength Comments BUEs grossly 5/5, BLE hips 3/5, knees 4/5   Transfers   Transfers Sit to Stand;Stand to Sit   Sit to Stand 5: Supervision;With upper extremity assist;With armrests;From chair/3-in-1  needs contact with RW to stabalize   Stand to Sit 5: Supervision;With upper extremity assist;With armrests;To chair/3-in-1  requires RW for support for stabilization   Ambulation/Gait   Ambulation/Gait Yes   Ambulation/Gait Assistance 5: Supervision   Ambulation Distance (Feet) 75 Feet   Assistive device Rolling walker   Gait Pattern Step-to pattern;Decreased step length - right;Decreased stance time - left;Decreased stride length;Decreased hip/knee flexion - right;Decreased weight shift to right;Right hip hike;Trunk flexed;Abducted- right;Poor foot clearance - right   Ambulation Surface Indoor;Level   Gait velocity 1.9ft/sec  <1.8 ft/sec indicates fall risk   Standardized Balance Assessment   Standardized Balance Assessment Berg Balance Test   Berg Balance Test   Sit to Stand Needs minimal aid to stand or to stabilize   Standing Unsupported Needs several tries to stand 30 seconds unsupported   Sitting with Back Unsupported but Feet Supported on Floor or Stool Able to sit safely and securely 2 minutes   Stand to Sit Controls descent by using hands   Transfers Able to transfer safely, minor use of  hands   Standing Unsupported with Eyes Closed Needs help to keep from falling   Standing Ubsupported with Feet Together  Needs help to attain position and unable to hold for 15 seconds   From Standing, Reach Forward with Outstretched Arm Loses balance while trying/requires external support   From Standing Position, Pick up Object from Floor Unable to pick up and needs supervision   From Standing Position, Turn to Look Behind Over each Shoulder Needs assist to keep from losing balance and falling   Turn 360 Degrees Needs assistance while turning   Standing Unsupported, Alternately Place Feet on Step/Stool Needs assistance to keep from falling or unable to try   Standing Unsupported, One Foot in [place] balance while stepping or standing   Standing on One Leg Unable to try or needs assist to prevent fall   Total Score 14   Timed Up and Go Test   Normal TUG (seconds) 28.64  rolling walker         Prosthetics Assessment - 08/14/15 0001    [place] with Prosthetic cleaning;Proper wear schedule/adjustment;Skin check   Prosthetic Care Dependent with Residual limb care;Correct ply sock adjustment   Donning prosthesis  Modified independent (Device/Increase time)   Doffing prosthesis  Modified independent (Device/Increase time)   Current prosthetic wear tolerance (days/week)  reports daily wear   Current prosthetic wear tolerance (#hours/day)  reports most of awake hours   Residual limb condition  No open areas, but callous at distal tibia from over weight bearing in area, Socket has multiple pads to modify already and is still too large as patient's residual limb has shrunk as typical with first prosthesis.   K code/activity level with prosthetic use  2                            PT Short Term Goals - 08/13/15 1515    PT SHORT TERM GOAL #1   Title Patient demonstrates understanding of initail HEP with minimal cues. (Target Date:  09/12/2015)   Time 1   Period Months   Status New   PT SHORT TERM GOAL #2   Title patient verbalizes understanding of fall prevention strategies in home. (Target Date: 09/12/2015)   Time 1   Period Months   Status New   PT SHORT TERM GOAL #3   Title patient verbalizes understanding proper activity modification to assist with pain management. (Target Date: 09/12/2015)   Time 1   Period Months   Status New           PT Long Term Goals - 08/13/15 1515    PT LONG TERM GOAL #1   Title Patient verbalizes & demonstrates understanding of ongoing HEP / fitness plan. (Target Date: 10/12/2015)   Time 2   Period Months   Status New   PT LONG TERM GOAL #2   Title Patient demonstrates / verbalizes managing activity level to manage pain. (Target Date: 10/12/2015)   Time 2   Period Months   Status New   PT LONG TERM GOAL #3   Title Patient ambulates 250' with rolling walker or rollator walker & prosthesis modified independent.  (Target Date: 10/12/2015)   Time 2   Period Months   Status New   PT LONG TERM GOAL #4   Title Timed Up & Go with rolling walker & prosthesis <20 seconds safely to decrease risk of falls.  (Target Date: 10/12/2015)   Time 2   Period Months   Status New   PT LONG TERM GOAL #5   Title Merrilee Jansky  Balance >20 / 56  (Target Date: 10/12/2015)   Time 2   Period Months   Status New   PT LONG TERM GOAL #6   Title Patient negotiates ramps & curbs with rolling walker & stairs with 1 rail with prosthesis with supervision.  (Target Date: 10/12/2015)   Time 2   Period Months   Status New   PT LONG TERM GOAL #7   Title Patient self reports via FOTO improvement in functional status by > 5 points.  (Target Date: 10/12/2015)   Time 2   Period Months   Status New               Plan - 06-Sep-2015 1515    Clinical Impression Statement This patient presents to PT for evaluation with reports of increased gait difficulty and falls. She reports daily wear of prosthesis most of  awake hours. Her prosthetic socket is too large causing play between her limb & the socket. This is one of key factors in her balance issues. She has weakness in both LEs, trunk & UEs. She has history of back & fibromylagia pain limiting her mobility. Berg Balance of 14/56, Timed Up & Go 28.64sec and Gait Velocity of 1.60ft/sec all indicate high fall risk.   Pt will benefit from skilled therapeutic intervention in order to improve on the following deficits Abnormal gait;Decreased activity tolerance;Decreased balance;Decreased endurance;Decreased knowledge of use of DME;Decreased mobility;Decreased range of motion;Decreased strength;Impaired flexibility;Prosthetic Dependency;Postural dysfunction;Pain   Rehab Potential Good   Clinical Impairments Affecting Rehab Potential chronic medical conditions with non-compliance    PT Frequency 2x / week   PT Duration Other (comment)  9 weeks (60 days)   PT Treatment/Interventions ADLs/Self Care Home Management;Moist Heat;DME Instruction;Gait training;Stair training;Functional mobility training;Therapeutic activities;Therapeutic exercise;Balance training;Neuromuscular re-education;Patient/family education;Prosthetic Training;Manual techniques;Passive range of motion   PT Next Visit Plan HEP for back, then add [place] as able, try gait with rollator walker for community based activities   Recommended Other Services Patient needs a socket revision for prosthesis with new liners.   Consulted and Agree with Plan of Care Patient;Family member/caregiver   Family Member Consulted son, Fenton Foy - September 06, 2015 1515    Functional Assessment Tool Used Timed Up & Go 28.64sec with rolling walker & prosthesis   Functional Limitation Mobility: Walking and moving around   Mobility: Walking and Moving Around Current Status [phone]) At least 60 percent but less than 80 percent impaired, limited or restricted   Mobility: Walking and Moving Around Goal Status [phone]) At  least 40 percent but less than 60 percent impaired, limited or restricted       Problem List Patient Active Problem List   Diagnosis Date Noted  . Skin tag 08/08/2015  . Counseling regarding end of life decision making 06/14/2015  . Atherosclerosis of aorta 04/04/2015  . Anemia 10/05/2014  . Chronic diastolic heart failure   . S/P BKA (below knee amputation) unilateral   . Tobacco abuse   . Severe obesity (BMI >= 40) 03/02/2013  . Abnormality of gait 03/01/2013  . Infective endocarditis 01/22/2013  . Healthcare maintenance 07/10/2012  . Opioid dependence 12/03/2011  . PVD in DM 08/27/2011  . GLAUCOMA 11/29/2009  . Essential hypertension 11/29/2009  . Chronic insomnia 10/25/2009  . GASTROESOPHAGEAL REFLUX DISEASE 11/24/2008  . Depression 04/06/2008  . DM (diabetes mellitus) type II uncontrolled, periph vascular disorder 04/02/2007  . Hyperlipemia 01/08/2007  . COPD (chronic obstructive pulmonary disease) 01/08/2007  Jamey Reas PT, DPT 08/14/2015, 8:32 AM  [facility] [address] [place], [place], [address] Phone: [phone]   Fax:  [phone]

## 2015-08-15 ENCOUNTER — Other Ambulatory Visit: Payer: Self-pay | Admitting: Internal Medicine

## 2015-08-15 ENCOUNTER — Encounter: Payer: Self-pay | Admitting: Internal Medicine

## 2015-08-15 NOTE — Progress Notes (Signed)
I saw and evaluated the patient. I personally confirmed the key portions of Dr. Shela Commons history and exam and reviewed pertinent patient test results. The assessment, diagnosis, and plan were formulated together and I agree with the documentation in the resident's note.  There was only minimal erythema at the edges of the lesions with no drainage or warmth.

## 2015-08-15 NOTE — Assessment & Plan Note (Signed)
Patient appears to have localized skin infections on the back of her neck and right midarm where shave biopsies were performed. Removal of these lesions required much deeper incisions. Patient likely has poor healing secondary to her uncontrolled diabetes. Since the infection appears localized topical antibiotics can be used at this time. Patient wishes to have more skin tags removed but would like for these lesions to heal first. Will prescribe polysporin ointment to be used twice daily. Patient instructed to return to clinic if the infections worsen or do not improve.

## 2015-08-15 NOTE — Assessment & Plan Note (Signed)
BP Readings from Last 3 Encounters:  08/14/15 149/58  08/13/15 171/93  08/08/15 152/61    Lab Results  Component Value Date   NA 145 01/31/2015   K 3.8 01/31/2015   CREATININE 0.68 01/31/2015    Assessment: Comments: BP improved compared to last visit. Still higher than goal. Patient takes her BP meds at 2 PM so difficult to assess control.   Plan: Medications:  Continue Norvasc 5 mg daily, Atenolol 100 mg daily, Benazepril 40 mg daily, and Lasix 80 mg daily Other plans:  - Would be helpful for patient to take BP at home and record. Can consider in future if BP continues to be elevated at office visits.

## 2015-08-15 NOTE — Progress Notes (Signed)
   Subjective:    Patient ID: [name], female    DOB: 10-16-1961, 54 y.o.   MRN: [id number]  HPI Ms. Leber is a 54yo woman with PMHx of HTN, type 2 DM, PVD, chronic diastolic HF, tobacco abuse, and COPD who presents today for follow up after her skin tag removal procedure.  Patient reports the lesion removed on the back of her neck and the one on her right mid arm have not healed well. These two lesions had to be taken off by shave biopsy. She notes the one on her neck has been draining clear fluid and painful to the point where it hurts to turn her head. She states the one on her right midarm has turned black and is less painful than the one on her neck. She reports all the other skin tags that were removed have healed well.    Review of Systems General: Denies fever, chills, night sweats, changes in weight, changes in appetite HEENT: Denies headaches, ear pain, changes in vision, rhinorrhea, sore throat CV: Denies CP, palpitations, SOB, orthopnea Pulm: Denies SOB, cough, wheezing GI: Denies abdominal pain, nausea, vomiting, diarrhea, constipation, melena, hematochezia GU: Denies dysuria, hematuria, frequency Msk: Denies muscle cramps, joint pains Neuro: Denies weakness, numbness, tingling Skin: Denies rashes, bruising    Objective:   Physical Exam General: alert, sitting up in chair, NAD CV: RRR, no m/g/r Pulm: CTA bilaterally, breaths non-labored Ext: Left leg stump with prosthesis. Prosthesis appears too large on top for stump.  Neuro: alert and oriented x 3, no focal deficits Skin: There is a small necrotic lesion present on the right midarm. Minimal tenderness to palpation, no erythema or drainage. Lesion on the back of the neck is open and appears erythematous. No active drainage. There is tenderness to palpation in the surrounding area.     Assessment & Plan:  Please refer to A&P documentation.

## 2015-08-16 ENCOUNTER — Encounter: Payer: Self-pay | Admitting: Physical Therapy

## 2015-08-16 ENCOUNTER — Ambulatory Visit: Payer: Medicare Other | Admitting: Physical Therapy

## 2015-08-16 ENCOUNTER — Telehealth: Payer: Self-pay | Admitting: Physical Therapy

## 2015-08-16 ENCOUNTER — Other Ambulatory Visit: Payer: Self-pay | Admitting: Internal Medicine

## 2015-08-16 DIAGNOSIS — Z89512 Acquired absence of left leg below knee: Secondary | ICD-10-CM | POA: Diagnosis not present

## 2015-08-16 DIAGNOSIS — M545 Low back pain: Secondary | ICD-10-CM

## 2015-08-16 DIAGNOSIS — R269 Unspecified abnormalities of gait and mobility: Secondary | ICD-10-CM

## 2015-08-16 DIAGNOSIS — R531 Weakness: Secondary | ICD-10-CM

## 2015-08-16 DIAGNOSIS — M792 Neuralgia and neuritis, unspecified: Secondary | ICD-10-CM | POA: Diagnosis not present

## 2015-08-16 DIAGNOSIS — R6889 Other general symptoms and signs: Secondary | ICD-10-CM

## 2015-08-16 NOTE — Patient Instructions (Signed)
ANKLE: Dorsiflexion - Sitting   Sitting with knee STRAIGHT Keep heel touching the floor, place strap around foot. Pull foot toward body, keeping heel on floor. Keep foot straight. Hold __15_ seconds. __3_ reps per leg,  __7_ days per week  Copyright  VHI. All rights reserved.

## 2015-08-16 NOTE — Therapy (Signed)
[facility] [address] [place], [place], [address] Phone: [phone]   Fax:  [phone]  Physical Therapy Treatment  Patient Details  Name: Jennifer [name] MRN: [id number] Date of Birth: 27-Feb-1961 Referring Provider:  Bartholomew Crews, MD  Encounter Date: 08/16/2015      PT End of Session - 08/16/15 1015    Visit Number 2   Number of Visits 18   Date for PT Re-Evaluation 10/12/15   Authorization Type Medicare G-Code 10th visit   PT Start Time 1779   PT Stop Time 1100   PT Time Calculation (min) 45 min   Equipment Utilized During Treatment Gait belt   Activity Tolerance Patient tolerated treatment well   Behavior During Therapy [facility] for tasks assessed/performed      Past Medical History  Diagnosis Date  . Depression   . GERD (gastroesophageal reflux disease)   . Hyperlipidemia   . Hypertension   . Glaucoma   . PVD (peripheral vascular disease) with claudication     Stents to bilateral common iliac arteries (left 2005, right 2008), on chronic plavix  . Hyperplastic colon polyp 12/2010    Per colonoscopy (12/2010) - Dr. Deatra Ina  . Asthma   . COPD 01/08/2007    PFT's 05/2007 : FEV1/FVC 82, FEV1 64% pred, FEF 25-75% 40% predicted, 16% improvement in FEV1 with bronchodilators.     . Tobacco abuse   . Chronic osteomyelitis of foot     chronic, right secondary to diabetic foot ulcers  . Polymicrobial bacterial infection 01/2013    GBS and S. aureus bacteremia // Source likely infected diabetic foot ulcer  . 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 Blood cultures 01/26/13 S. Aureus and GBS. Blood cultures Feb 6th, 8th, and 9th and March were negative.Repeat TEE 3/20 negative for vegitations  . 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 Chronic diastolic heart failure     grade 2 per 2D echocardiogram (01/2013)  . Ulcer of foot, chronic    Left. No OM per MRI (01/2013)  . DVT of upper extremity (deep vein thrombosis) 03/11/2013    Secondary to PICC line. Right brachial vein, diagnosed on 03/10/2013 Coumadin for 3 months. End date 06/10/2013   . Chronic pain syndrome 12/03/2011    Likely secondary to depression, "fibromyalgia", neuropathy, and obesity. Lumbar MRI 2014 no sig change from prior (2008) : Stable hypertrophic facet disease most notable at L4-5. Stable shallow left foraminal/extraforaminal disc protrusion at L4-5. No direct neural compression.      . Environmental allergies     Hx: of  . Fatty liver 2003    observed on ultrasound abdomen  . CHF (congestive heart failure)   . Anginal pain     '3' of 10 ischemia ruled out 9/9   . Fibromyalgia   . Chronic bronchitis     "I get it alot" (09/28/2013)  . Exertional shortness of breath   . Rheumatoid arthritis   . Arthritis of lumbar spine   . Chronic lower back pain   . Diabetic peripheral neuropathy   . Type II diabetes mellitus with peripheral circulatory disorders, uncontrolled DX: 1993    Insulin dep. Poor control. Complicated by diabetic foot ulcer and diabetic eye disease.    . 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 2/2 tobacco use, DM, and  cont weight bearing on surgical wound and developed gangrene     Past Surgical History  Procedure Laterality Date  . Abdominal hysterectomy  1997    secondary to uterine fibroids  . Tubal ligation    . Shoulder arthroscopy w/ rotator cuff repair Bilateral   . Breast biopsy      multiple-benign per pt  . Wrist surgery Right     "for tumors" (09/28/2013)  . Ganglion cyst excision      multiple  . Other surgical history      stents in lower ext  . Tee without cardioversion N/A 01/31/2013    Procedure: TRANSESOPHAGEAL ECHOCARDIOGRAM (TEE);  Surgeon: Fay Records, MD;  Location: [place];  Service: Cardiovascular;  Laterality: N/A;  Rm [phone]  .  Bladder surgery      bladder reconstruction surgery  . Tee without cardioversion N/A 03/10/2013    Procedure: TRANSESOPHAGEAL ECHOCARDIOGRAM (TEE);  Surgeon: Larey Dresser, MD;  Location: [facility];  Service: Cardiovascular;  Laterality: N/A;  Rm. [phone]  . Skin split graft Bilateral 05/13/2013    Procedure: Right and Left Foot Allograft Skin Graft;  Surgeon: Newt Minion, MD;  Location: [place];  Service: Orthopedics;  Laterality: Bilateral;  Right and Left Foot Allograft Skin Graft  . Toe amputation Left 08/31/2013    4TH & 5 TH TOE   . Amputation Left 08/31/2013    Procedure: AMPUTATION RAY;  Surgeon: Newt Minion, MD;  Location: [place];  Service: Orthopedics;  Laterality: Left;  Left Foot 5th Ray Amputation  . Esophagogastroduodenoscopy N/A 09/20/2013    Procedure: ESOPHAGOGASTRODUODENOSCOPY (EGD);  Surgeon: Jerene Bears, MD;  Location: [place];  Service: Gastroenterology;  Laterality: N/A;  . Foot amputation through metatarsal Left 09/28/2013  . Tonsillectomy    . Amputation Left 09/28/2013    Procedure: Left Midfoot amputation;  Surgeon: Newt Minion, MD;  Location: [place];  Service: Orthopedics;  Laterality: Left;  Left Midfoot amputation  . Amputation Left 10/14/2013    Procedure: AMPUTATION BELOW KNEE- left;  Surgeon: Newt Minion, MD;  Location: [place];  Service: Orthopedics;  Laterality: Left;  Left Below Knee Amputation     There were no vitals filed for this visit.  Visit Diagnosis:  Abnormality of gait  Status post below knee amputation of left lower extremity  Decreased functional activity tolerance  Midline low back pain, with sciatica presence unspecified  Weakness generalized      Subjective Assessment - 08/16/15 1015    Subjective She saw Dr. [name] yesterday who wrote prescription for new prosthetic socket.   Currently in Pain? Yes   Pain Score 3    Pain Location Back   Pain Orientation Lower   Pain Descriptors / Indicators Aching;Burning   Pain Type  Chronic pain   Pain Onset More than a month ago   Pain Frequency Constant                                 PT Education - 08/16/15 1015    Education provided Yes   Education Details HEP for back: posterior pelvic tilt (ppt), single & double knee to chest stretch, trunk rotation, bridging, ppt with hip rotation /abduction, heel slides & hip flexion, seated hamstring & heelcord stretches, pelvic rocking; supine to /from sit technique via sidelying to protect back   Person(s) Educated Patient   Methods Explanation;Demonstration;Tactile cues;Verbal cues;Handout   Comprehension  Verbalized understanding;Returned demonstration;Verbal cues required;Tactile cues required;Need further instruction          PT Short Term Goals - 08/13/15 1515    PT SHORT TERM GOAL #1   Title Patient demonstrates understanding of initail HEP with minimal cues. (Target Date: 09/12/2015)   Time 1   Period Months   Status New   PT SHORT TERM GOAL #2   Title patient verbalizes understanding of fall prevention strategies in home. (Target Date: 09/12/2015)   Time 1   Period Months   Status New   PT SHORT TERM GOAL #3   Title patient verbalizes understanding proper activity modification to assist with pain management. (Target Date: 09/12/2015)   Time 1   Period Months   Status New           PT Long Term Goals - 08/13/15 1515    PT LONG TERM GOAL #1   Title Patient verbalizes & demonstrates understanding of ongoing HEP / fitness plan. (Target Date: 10/12/2015)   Time 2   Period Months   Status New   PT LONG TERM GOAL #2   Title Patient demonstrates / verbalizes managing activity level to manage pain. (Target Date: 10/12/2015)   Time 2   Period Months   Status New   PT LONG TERM GOAL #3   Title Patient ambulates 250' with rolling walker or rollator walker & prosthesis modified independent.  (Target Date: 10/12/2015)   Time 2   Period Months   Status New   PT LONG TERM GOAL #4    Title Timed Up & Go with rolling walker & prosthesis <20 seconds safely to decrease risk of falls.  (Target Date: 10/12/2015)   Time 2   Period Months   Status New   PT LONG TERM GOAL #5   Title Berg Balance >20 / 56  (Target Date: 10/12/2015)   Time 2   Period Months   Status New   PT LONG TERM GOAL #6   Title Patient negotiates ramps & curbs with rolling walker & stairs with 1 rail with prosthesis with supervision.  (Target Date: 10/12/2015)   Time 2   Period Months   Status New   PT LONG TERM GOAL #7   Title Patient self reports via FOTO improvement in functional status by > 5 points.  (Target Date: 10/12/2015)   Time 2   Period Months   Status New               Plan - 08/16/15 1015    Clinical Impression Statement Patient appears to understand back exercises that were instructed today but need to be reviewed next session to check her technique. Patient did not report any changes in back pain with exercises.   Pt will benefit from skilled therapeutic intervention in order to improve on the following deficits Abnormal gait;Decreased activity tolerance;Decreased balance;Decreased endurance;Decreased knowledge of use of DME;Decreased mobility;Decreased range of motion;Decreased strength;Impaired flexibility;Prosthetic Dependency;Postural dysfunction;Pain   Rehab Potential Good   Clinical Impairments Affecting Rehab Potential chronic medical conditions with non-compliance    PT Frequency 2x / week   PT Duration Other (comment)  9 weeks (60 days)   PT Treatment/Interventions ADLs/Self Care Home Management;Moist Heat;DME Instruction;Gait training;Stair training;Functional mobility training;Therapeutic activities;Therapeutic exercise;Balance training;Neuromuscular re-education;Patient/family education;Prosthetic Training;Manual techniques;Passive range of motion   PT Next Visit Plan Check HEP for back, then add [place] as able, try gait with rollator walker for community based  activities   Consulted and Agree with Plan of  Care Patient        Problem List Patient Active Problem List   Diagnosis Date Noted  . Skin tag 08/08/2015  . Counseling regarding end of life decision making 06/14/2015  . Atherosclerosis of aorta 04/04/2015  . Anemia 10/05/2014  . Chronic diastolic heart failure   . S/P BKA (below knee amputation) unilateral   . Tobacco abuse   . Severe obesity (BMI >= 40) 03/02/2013  . Abnormality of gait 03/01/2013  . Infective endocarditis 01/22/2013  . Healthcare maintenance 07/10/2012  . Opioid dependence 12/03/2011  . PVD in DM 08/27/2011  . GLAUCOMA 11/29/2009  . Essential hypertension 11/29/2009  . Chronic insomnia 10/25/2009  . GASTROESOPHAGEAL REFLUX DISEASE 11/24/2008  . Depression 04/06/2008  . DM (diabetes mellitus) type II uncontrolled, periph vascular disorder 04/02/2007  . Hyperlipemia 01/08/2007  . COPD (chronic obstructive pulmonary disease) 01/08/2007    Jamey Reas PT, DPT 08/16/2015, 1:52 PM  [facility] [address] [place], [place], [address] Phone: [phone]   Fax:  [phone]

## 2015-08-16 NOTE — Telephone Encounter (Signed)
Zoloft 100 sent in Aug

## 2015-08-16 NOTE — Telephone Encounter (Signed)
Jennifer [name] needs a socket revision including new silicon gel liner with shuttle pin locks. Please document residual limb shrinkage with smaller limb size and large socket volume. Then FAX a copy of your note & a prescription to [facility] at [phone]. Their phone number is [phone] if you need to speak to a clinician regarding documentation quesations. Thank You, Jamey Reas, PT, DPT Physical Therapist Specializing in [place] [address] [place], [place] [address] [phone]

## 2015-08-17 NOTE — Telephone Encounter (Signed)
This was temp, just to be used after skin tag removal. IF not healed, needs appt.

## 2015-08-22 NOTE — Telephone Encounter (Signed)
Called neuro rehab and left message that this was beyond my scope. I never heard back

## 2015-08-23 ENCOUNTER — Encounter: Payer: Self-pay | Admitting: Physical Therapy

## 2015-08-23 ENCOUNTER — Ambulatory Visit: Payer: Medicare Other | Attending: Internal Medicine | Admitting: Physical Therapy

## 2015-08-23 DIAGNOSIS — M545 Low back pain: Secondary | ICD-10-CM | POA: Insufficient documentation

## 2015-08-23 DIAGNOSIS — R6889 Other general symptoms and signs: Secondary | ICD-10-CM | POA: Diagnosis present

## 2015-08-23 DIAGNOSIS — R531 Weakness: Secondary | ICD-10-CM

## 2015-08-23 DIAGNOSIS — M792 Neuralgia and neuritis, unspecified: Secondary | ICD-10-CM | POA: Insufficient documentation

## 2015-08-23 DIAGNOSIS — Z89512 Acquired absence of left leg below knee: Secondary | ICD-10-CM | POA: Insufficient documentation

## 2015-08-23 DIAGNOSIS — R269 Unspecified abnormalities of gait and mobility: Secondary | ICD-10-CM | POA: Diagnosis not present

## 2015-08-23 NOTE — Therapy (Signed)
[facility] [address] [place], [place], [address] Phone: [phone]   Fax:  [phone]  Physical Therapy Treatment  Patient Details  Name: Jennifer [name] MRN: [id number] Date of Birth: [birth year] Referring Provider:  Bartholomew Crews, MD  Encounter Date: 08/23/2015      PT End of Session - 08/23/15 1100    Visit Number 3   Number of Visits 18   Date for PT Re-Evaluation 10/12/15   Authorization Type Medicare G-Code 10th visit   PT Start Time 1100   PT Stop Time 1145   PT Time Calculation (min) 45 min   Equipment Utilized During Treatment Gait belt   Activity Tolerance Patient tolerated treatment well   Behavior During Therapy [facility] for tasks assessed/performed      Past Medical History  Diagnosis Date  . Depression   . GERD (gastroesophageal reflux disease)   . Hyperlipidemia   . Hypertension   . Glaucoma   . PVD (peripheral vascular disease) with claudication     Stents to bilateral common iliac arteries (left 2005, right 2008), on chronic plavix  . Hyperplastic colon polyp 12/2010    Per colonoscopy (12/2010) - Dr. Deatra Ina  . Asthma   . COPD 01/08/2007    PFT's 05/2007 : FEV1/FVC 82, FEV1 64% pred, FEF 25-75% 40% predicted, 16% improvement in FEV1 with bronchodilators.     . Tobacco abuse   . Chronic osteomyelitis of foot     chronic, right secondary to diabetic foot ulcers  . Polymicrobial bacterial infection 01/2013    GBS and S. aureus bacteremia // Source likely infected diabetic foot ulcer  . 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 Blood cultures 01/26/13 S. Aureus and GBS. Blood cultures Feb 6th, 8th, and 9th and March were negative.Repeat TEE 3/20 negative for vegitations  . 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 Chronic diastolic heart failure     grade 2 per 2D echocardiogram (01/2013)  . Ulcer of foot, chronic     Left.  No OM per MRI (01/2013)  . DVT of upper extremity (deep vein thrombosis) 03/11/2013    Secondary to PICC line. Right brachial vein, diagnosed on 03/10/2013 Coumadin for 3 months. End date 06/10/2013   . Chronic pain syndrome 12/03/2011    Likely secondary to depression, "fibromyalgia", neuropathy, and obesity. Lumbar MRI 2014 no sig change from prior (2008) : Stable hypertrophic facet disease most notable at L4-5. Stable shallow left foraminal/extraforaminal disc protrusion at L4-5. No direct neural compression.      . Environmental allergies     Hx: of  . Fatty liver 2003    observed on ultrasound abdomen  . CHF (congestive heart failure)   . Anginal pain     '3' of 10 ischemia ruled out 9/9   . Fibromyalgia   . Chronic bronchitis     "I get it alot" (09/28/2013)  . Exertional shortness of breath   . Rheumatoid arthritis   . Arthritis of lumbar spine   . Chronic lower back pain   . Diabetic peripheral neuropathy   . Type II diabetes mellitus with peripheral circulatory disorders, uncontrolled DX: 1993    Insulin dep. Poor control. Complicated by diabetic foot ulcer and diabetic eye disease.    . 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 2/2 tobacco use,  DM, and cont weight bearing on surgical wound and developed gangrene     Past Surgical History  Procedure Laterality Date  . Abdominal hysterectomy  1997    secondary to uterine fibroids  . Tubal ligation    . Shoulder arthroscopy w/ rotator cuff repair Bilateral   . Breast biopsy      multiple-benign per pt  . Wrist surgery Right     "for tumors" (09/28/2013)  . Ganglion cyst excision      multiple  . Other surgical history      stents in lower ext  . Tee without cardioversion N/A 01/31/2013    Procedure: TRANSESOPHAGEAL ECHOCARDIOGRAM (TEE);  Surgeon: Fay Records, MD;  Location: [place];  Service: Cardiovascular;  Laterality: N/A;  Rm [phone]  . Bladder  surgery      bladder reconstruction surgery  . Tee without cardioversion N/A 03/10/2013    Procedure: TRANSESOPHAGEAL ECHOCARDIOGRAM (TEE);  Surgeon: Larey Dresser, MD;  Location: [facility];  Service: Cardiovascular;  Laterality: N/A;  Rm. [phone]  . Skin split graft Bilateral 05/13/2013    Procedure: Right and Left Foot Allograft Skin Graft;  Surgeon: Newt Minion, MD;  Location: [facility];  Service: Orthopedics;  Laterality: Bilateral;  Right and Left Foot Allograft Skin Graft  . Toe amputation Left 08/31/2013    4TH & 5 TH TOE   . Amputation Left 08/31/2013    Procedure: AMPUTATION RAY;  Surgeon: Newt Minion, MD;  Location: [facility];  Service: Orthopedics;  Laterality: Left;  Left Foot 5th Ray Amputation  . Esophagogastroduodenoscopy N/A 09/20/2013    Procedure: ESOPHAGOGASTRODUODENOSCOPY (EGD);  Surgeon: Jerene Bears, MD;  Location: [place];  Service: Gastroenterology;  Laterality: N/A;  . Foot amputation through metatarsal Left 09/28/2013  . Tonsillectomy    . Amputation Left 09/28/2013    Procedure: Left Midfoot amputation;  Surgeon: Newt Minion, MD;  Location: [facility];  Service: Orthopedics;  Laterality: Left;  Left Midfoot amputation  . Amputation Left 10/14/2013    Procedure: AMPUTATION BELOW KNEE- left;  Surgeon: Newt Minion, MD;  Location: [facility];  Service: Orthopedics;  Laterality: Left;  Left Below Knee Amputation     There were no vitals filed for this visit.  Visit Diagnosis:  Abnormality of gait  Status post below knee amputation of left lower extremity  Decreased functional activity tolerance  Midline low back pain, with sciatica presence unspecified  Weakness generalized  Pain, neuropathic      Subjective Assessment - 08/23/15 1105    Subjective She saw [employer] and she has high bill from previous that she has not made any payments on for awhile.    Currently in Pain? Yes   Pain Score 9    Pain Location Back   Pain Orientation Lower   Pain Descriptors /  Indicators Aching;Burning   Pain Type Chronic pain   Pain Onset More than a month ago   Pain Frequency Constant   Aggravating Factors  slept wrong & weather changes   Pain Relieving Factors sitting, medications   Multiple Pain Sites Yes   Pain Score 8   Pain Location Other (Comment)  hands & feet with neuralgia & fibromyalgia   Pain Descriptors / Indicators Burning   Pain Type Chronic pain   Pain Onset More than a month ago   Pain Frequency Constant   Aggravating Factors  weather   Pain Relieving Factors medication     Therapeutic Exercise:  Patient demonstrated understanding  of seated hamstring stretch, heelcord stretch and pelvic rocking. She verbalized understanding of supine back exercises of posterior pelvic tilt (ppt), ppt with hip abd/add rotation, ppt with heel slides, ppt with hip flexion, single & double knee to chest stretch, trunk rotation & bridging. PT initiated [place] HEP with cervical rotation, cervical retraction, standing with RW support: trunk rotation, back extension, seated ankle DF/PF, seated alternate knee extension.  Gait Training: PT demo, instructed in rollator walker use with emphasis on brakes.  Sit to /from stand w/c to rollator with cues for safety. Patient ambulated 18' with rollator walker with minA & verbal cues.                              PT Education - 08/23/15 1100    Education provided Yes   Education Details PT reviewed Back exercises from previous session; initiated [place] with seated neck rotation & retraction, standing with RW back extension, trunk rotation, seated ankle PF/DF, knee ext   Person(s) Educated Patient   Methods Explanation;Demonstration;Tactile cues;Verbal cues;Handout   Comprehension Verbalized understanding;Returned demonstration;Verbal cues required;Tactile cues required;Need further instruction          PT Short Term Goals - 08/13/15 1515    PT SHORT TERM GOAL #1   Title Patient demonstrates  understanding of initail HEP with minimal cues. (Target Date: 09/12/2015)   Time 1   Period Months   Status New   PT SHORT TERM GOAL #2   Title patient verbalizes understanding of fall prevention strategies in home. (Target Date: 09/12/2015)   Time 1   Period Months   Status New   PT SHORT TERM GOAL #3   Title patient verbalizes understanding proper activity modification to assist with pain management. (Target Date: 09/12/2015)   Time 1   Period Months   Status New           PT Long Term Goals - 08/13/15 1515    PT LONG TERM GOAL #1   Title Patient verbalizes & demonstrates understanding of ongoing HEP / fitness plan. (Target Date: 10/12/2015)   Time 2   Period Months   Status New   PT LONG TERM GOAL #2   Title Patient demonstrates / verbalizes managing activity level to manage pain. (Target Date: 10/12/2015)   Time 2   Period Months   Status New   PT LONG TERM GOAL #3   Title Patient ambulates 250' with rolling walker or rollator walker & prosthesis modified independent.  (Target Date: 10/12/2015)   Time 2   Period Months   Status New   PT LONG TERM GOAL #4   Title Timed Up & Go with rolling walker & prosthesis <20 seconds safely to decrease risk of falls.  (Target Date: 10/12/2015)   Time 2   Period Months   Status New   PT LONG TERM GOAL #5   Title Berg Balance >20 / 56  (Target Date: 10/12/2015)   Time 2   Period Months   Status New   PT LONG TERM GOAL #6   Title Patient negotiates ramps & curbs with rolling walker & stairs with 1 rail with prosthesis with supervision.  (Target Date: 10/12/2015)   Time 2   Period Months   Status New   PT LONG TERM GOAL #7   Title Patient self reports via FOTO improvement in functional status by > 5 points.  (Target Date: 10/12/2015)   Time 2  Period Months   Status New               Plan - 08/23/15 1100    Clinical Impression Statement Patient appears to understand HEP instructed thus far. Patient has basic  understanding to using rollator walker on level surfaces but needs more training before attempting to use rollator outside of PT.   Pt will benefit from skilled therapeutic intervention in order to improve on the following deficits Abnormal gait;Decreased activity tolerance;Decreased balance;Decreased endurance;Decreased knowledge of use of DME;Decreased mobility;Decreased range of motion;Decreased strength;Impaired flexibility;Prosthetic Dependency;Postural dysfunction;Pain   Rehab Potential Good   Clinical Impairments Affecting Rehab Potential chronic medical conditions with non-compliance    PT Frequency 2x / week   PT Duration Other (comment)  9 weeks (60 days)   PT Treatment/Interventions ADLs/Self Care Home Management;Moist Heat;DME Instruction;Gait training;Stair training;Functional mobility training;Therapeutic activities;Therapeutic exercise;Balance training;Neuromuscular re-education;Patient/family education;Prosthetic Training;Manual techniques;Passive range of motion   PT Next Visit Plan Check HEP then add more [place] as able, gait with rollator walker for community based activities   Consulted and Agree with Plan of Care Patient        Problem List Patient Active Problem List   Diagnosis Date Noted  . Skin tag 08/08/2015  . Counseling regarding end of life decision making 06/14/2015  . Atherosclerosis of aorta 04/04/2015  . Anemia 10/05/2014  . Chronic diastolic heart failure   . S/P BKA (below knee amputation) unilateral   . Tobacco abuse   . Severe obesity (BMI >= 40) 03/02/2013  . Abnormality of gait 03/01/2013  . Infective endocarditis 01/22/2013  . Healthcare maintenance 07/10/2012  . Opioid dependence 12/03/2011  . PVD in DM 08/27/2011  . GLAUCOMA 11/29/2009  . Essential hypertension 11/29/2009  . Chronic insomnia 10/25/2009  . GASTROESOPHAGEAL REFLUX DISEASE 11/24/2008  . Depression 04/06/2008  . DM (diabetes mellitus) type II uncontrolled, periph vascular  disorder 04/02/2007  . Hyperlipemia 01/08/2007  . COPD (chronic obstructive pulmonary disease) 01/08/2007    Jamey Reas PT, DPT 08/23/2015, 3:04 PM  [facility] [address] [place], [place], [address] Phone: [phone]   Fax:  [phone]

## 2015-08-24 ENCOUNTER — Ambulatory Visit: Payer: Medicare Other

## 2015-08-24 DIAGNOSIS — M545 Low back pain: Secondary | ICD-10-CM | POA: Diagnosis not present

## 2015-08-24 DIAGNOSIS — M792 Neuralgia and neuritis, unspecified: Secondary | ICD-10-CM | POA: Diagnosis not present

## 2015-08-24 DIAGNOSIS — R6889 Other general symptoms and signs: Secondary | ICD-10-CM | POA: Diagnosis not present

## 2015-08-24 DIAGNOSIS — R531 Weakness: Secondary | ICD-10-CM | POA: Diagnosis not present

## 2015-08-24 DIAGNOSIS — R269 Unspecified abnormalities of gait and mobility: Secondary | ICD-10-CM

## 2015-08-24 DIAGNOSIS — Z89512 Acquired absence of left leg below knee: Secondary | ICD-10-CM | POA: Diagnosis not present

## 2015-08-24 NOTE — Therapy (Signed)
[facility] [address] [place], [place], [address] Phone: [phone]   Fax:  [phone]  Physical Therapy Treatment  Patient Details  Name: Jennifer [name] MRN: [id number] Date of Birth: 02/26/1961 Referring Provider:  Bartholomew Crews, MD  Encounter Date: 08/24/2015      PT End of Session - 08/24/15 1623    Visit Number 4   Number of Visits 18   Date for PT Re-Evaluation 10/12/15   Authorization Type Medicare G-Code 10th visit   PT Start Time 1521   PT Stop Time 1604   PT Time Calculation (min) 43 min   Equipment Utilized During Treatment Gait belt   Activity Tolerance Patient tolerated treatment well   Behavior During Therapy [facility] for tasks assessed/performed      Past Medical History  Diagnosis Date  . Depression   . GERD (gastroesophageal reflux disease)   . Hyperlipidemia   . Hypertension   . Glaucoma   . PVD (peripheral vascular disease) with claudication     Stents to bilateral common iliac arteries (left 2005, right 2008), on chronic plavix  . Hyperplastic colon polyp 12/2010    Per colonoscopy (12/2010) - Dr. Deatra Ina  . Asthma   . COPD 01/08/2007    PFT's 05/2007 : FEV1/FVC 82, FEV1 64% pred, FEF 25-75% 40% predicted, 16% improvement in FEV1 with bronchodilators.     . Tobacco abuse   . Chronic osteomyelitis of foot     chronic, right secondary to diabetic foot ulcers  . Polymicrobial bacterial infection 01/2013    GBS and S. aureus bacteremia // Source likely infected diabetic foot ulcer  . 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 Blood cultures 01/26/13 S. Aureus and GBS. Blood cultures Feb 6th, 8th, and 9th and March were negative.Repeat TEE 3/20 negative for vegitations  . 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 Chronic diastolic heart failure     grade 2 per 2D echocardiogram (01/2013)  . Ulcer of foot, chronic     Left.  No OM per MRI (01/2013)  . DVT of upper extremity (deep vein thrombosis) 03/11/2013    Secondary to PICC line. Right brachial vein, diagnosed on 03/10/2013 Coumadin for 3 months. End date 06/10/2013   . Chronic pain syndrome 12/03/2011    Likely secondary to depression, "fibromyalgia", neuropathy, and obesity. Lumbar MRI 2014 no sig change from prior (2008) : Stable hypertrophic facet disease most notable at L4-5. Stable shallow left foraminal/extraforaminal disc protrusion at L4-5. No direct neural compression.      . Environmental allergies     Hx: of  . Fatty liver 2003    observed on ultrasound abdomen  . CHF (congestive heart failure)   . Anginal pain     '3' of 10 ischemia ruled out 9/9   . Fibromyalgia   . Chronic bronchitis     "I get it alot" (09/28/2013)  . Exertional shortness of breath   . Rheumatoid arthritis   . Arthritis of lumbar spine   . Chronic lower back pain   . Diabetic peripheral neuropathy   . Type II diabetes mellitus with peripheral circulatory disorders, uncontrolled DX: 1993    Insulin dep. Poor control. Complicated by diabetic foot ulcer and diabetic eye disease.    . 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 2/2 tobacco use,  DM, and cont weight bearing on surgical wound and developed gangrene     Past Surgical History  Procedure Laterality Date  . Abdominal hysterectomy  1997    secondary to uterine fibroids  . Tubal ligation    . Shoulder arthroscopy w/ rotator cuff repair Bilateral   . Breast biopsy      multiple-benign per pt  . Wrist surgery Right     "for tumors" (09/28/2013)  . Ganglion cyst excision      multiple  . Other surgical history      stents in lower ext  . Tee without cardioversion N/A 01/31/2013    Procedure: TRANSESOPHAGEAL ECHOCARDIOGRAM (TEE);  Surgeon: Fay Records, MD;  Location: [facility];  Service: Cardiovascular;  Laterality: N/A;  Rm [phone]  . Bladder  surgery      bladder reconstruction surgery  . Tee without cardioversion N/A 03/10/2013    Procedure: TRANSESOPHAGEAL ECHOCARDIOGRAM (TEE);  Surgeon: Larey Dresser, MD;  Location: [place];  Service: Cardiovascular;  Laterality: N/A;  Rm. [phone]  . Skin split graft Bilateral 05/13/2013    Procedure: Right and Left Foot Allograft Skin Graft;  Surgeon: Newt Minion, MD;  Location: [facility];  Service: Orthopedics;  Laterality: Bilateral;  Right and Left Foot Allograft Skin Graft  . Toe amputation Left 08/31/2013    4TH & 5 TH TOE   . Amputation Left 08/31/2013    Procedure: AMPUTATION RAY;  Surgeon: Newt Minion, MD;  Location: [facility];  Service: Orthopedics;  Laterality: Left;  Left Foot 5th Ray Amputation  . Esophagogastroduodenoscopy N/A 09/20/2013    Procedure: ESOPHAGOGASTRODUODENOSCOPY (EGD);  Surgeon: Jerene Bears, MD;  Location: [facility];  Service: Gastroenterology;  Laterality: N/A;  . Foot amputation through metatarsal Left 09/28/2013  . Tonsillectomy    . Amputation Left 09/28/2013    Procedure: Left Midfoot amputation;  Surgeon: Newt Minion, MD;  Location: [facility];  Service: Orthopedics;  Laterality: Left;  Left Midfoot amputation  . Amputation Left 10/14/2013    Procedure: AMPUTATION BELOW KNEE- left;  Surgeon: Newt Minion, MD;  Location: [facility];  Service: Orthopedics;  Laterality: Left;  Left Below Knee Amputation     There were no vitals filed for this visit.  Visit Diagnosis:  Weakness generalized  Abnormality of gait      Subjective Assessment - 08/24/15 1523    Subjective Pt denied falls or changes since last visit.   Patient Stated Goals To use prosthesis to walk further & not have to worry about falling.   Currently in Pain? Yes   Pain Score --  9.5/10   Pain Location Back  and "all over"   Pain Orientation Lower   Pain Descriptors / Indicators Aching;Burning   Pain Type Chronic pain   Pain Onset More than a month ago   Pain Frequency Constant   Aggravating  Factors  weather, standing   Pain Relieving Factors medications, and sitting down                              Balance Exercises - 08/24/15 1525    OTAGO PROGRAM   Head Movements Standing;5 reps  with 2-3 second holds   Neck Movements Sitting;5 reps  with 5 second holds   Back Extension Standing;5 reps  with UE support on RW   Trunk Movements Standing;5 reps   Ankle Movements Sitting;10 reps   Knee Extensor 10 reps  Knee Flexor 10 reps   Hip ABductor 10 reps   Ankle Plantorflexors 20 reps, support   Ankle Dorsiflexors 20 reps, support   Knee Bends 10 reps, support   Backwards Walking Support   Walking and Turning Around --  did not perform   Sideways Walking Assistive device  holding onto counter   Tandem Stance 10 seconds, support   Tandem Walk --  did not perform   One Leg Stand 10 seconds, support   Toe Walk --  did not perform   Heel Toe Walking Backward --  did not perform   Sit to Stand 10 reps, bilateral support   Stair Walking did not perform           PT Education - 08/24/15 1623    Education provided Yes   Education Details Reviewed and completed OTAGO HEP.   Person(s) Educated Patient   Methods Explanation;Demonstration;Tactile cues;Verbal cues;Handout   Comprehension Returned demonstration;Verbalized understanding;Need further instruction          PT Short Term Goals - 08/13/15 1515    PT SHORT TERM GOAL #1   Title Patient demonstrates understanding of initail HEP with minimal cues. (Target Date: 09/12/2015)   Time 1   Period Months   Status New   PT SHORT TERM GOAL #2   Title patient verbalizes understanding of fall prevention strategies in home. (Target Date: 09/12/2015)   Time 1   Period Months   Status New   PT SHORT TERM GOAL #3   Title patient verbalizes understanding proper activity modification to assist with pain management. (Target Date: 09/12/2015)   Time 1   Period Months   Status New           PT  Long Term Goals - 08/13/15 1515    PT LONG TERM GOAL #1   Title Patient verbalizes & demonstrates understanding of ongoing HEP / fitness plan. (Target Date: 10/12/2015)   Time 2   Period Months   Status New   PT LONG TERM GOAL #2   Title Patient demonstrates / verbalizes managing activity level to manage pain. (Target Date: 10/12/2015)   Time 2   Period Months   Status New   PT LONG TERM GOAL #3   Title Patient ambulates 250' with rolling walker or rollator walker & prosthesis modified independent.  (Target Date: 10/12/2015)   Time 2   Period Months   Status New   PT LONG TERM GOAL #4   Title Timed Up & Go with rolling walker & prosthesis <20 seconds safely to decrease risk of falls.  (Target Date: 10/12/2015)   Time 2   Period Months   Status New   PT LONG TERM GOAL #5   Title Berg Balance >20 / 56  (Target Date: 10/12/2015)   Time 2   Period Months   Status New   PT LONG TERM GOAL #6   Title Patient negotiates ramps & curbs with rolling walker & stairs with 1 rail with prosthesis with supervision.  (Target Date: 10/12/2015)   Time 2   Period Months   Status New   PT LONG TERM GOAL #7   Title Patient self reports via FOTO improvement in functional status by > 5 points.  (Target Date: 10/12/2015)   Time 2   Period Months   Status New               Plan - 08/24/15 1623    Clinical Impression Statement Pt demonstrated understanding of  HEP after cuing and demonstration for technique. Pt continues to require frequent seated rest breaks 2/2 fatigue. Pt would continue to benefit from skilled PT to improve safety during functional mobilty.   Pt will benefit from skilled therapeutic intervention in order to improve on the following deficits Abnormal gait;Decreased activity tolerance;Decreased balance;Decreased endurance;Decreased knowledge of use of DME;Decreased mobility;Decreased range of motion;Decreased strength;Impaired flexibility;Prosthetic Dependency;Postural  dysfunction;Pain   Rehab Potential Good   Clinical Impairments Affecting Rehab Potential chronic medical conditions with non-compliance    PT Frequency 2x / week   PT Duration Other (comment)  9 weeks   PT Treatment/Interventions ADLs/Self Care Home Management;Moist Heat;DME Instruction;Gait training;Stair training;Functional mobility training;Therapeutic activities;Therapeutic exercise;Balance training;Neuromuscular re-education;Patient/family education;Prosthetic Training;Manual techniques;Passive range of motion   PT Next Visit Plan Gait with rollator for community based activities   Consulted and Agree with Plan of Care Patient        Problem List Patient Active Problem List   Diagnosis Date Noted  . Skin tag 08/08/2015  . Counseling regarding end of life decision making 06/14/2015  . Atherosclerosis of aorta 04/04/2015  . Anemia 10/05/2014  . Chronic diastolic heart failure   . S/P BKA (below knee amputation) unilateral   . Tobacco abuse   . Severe obesity (BMI >= 40) 03/02/2013  . Abnormality of gait 03/01/2013  . Infective endocarditis 01/22/2013  . Healthcare maintenance 07/10/2012  . Opioid dependence 12/03/2011  . PVD in DM 08/27/2011  . GLAUCOMA 11/29/2009  . Essential hypertension 11/29/2009  . Chronic insomnia 10/25/2009  . GASTROESOPHAGEAL REFLUX DISEASE 11/24/2008  . Depression 04/06/2008  . DM (diabetes mellitus) type II uncontrolled, periph vascular disorder 04/02/2007  . Hyperlipemia 01/08/2007  . COPD (chronic obstructive pulmonary disease) 01/08/2007    Tara Wich L 08/24/2015, 4:27 PM  [facility] [address] [place], [place], [address] Phone: [phone]   Fax:  [phone]     Geoffry Paradise, PT,DPT 08/24/2015 4:27 PM Phone: [phone] Fax: [phone]

## 2015-08-29 ENCOUNTER — Ambulatory Visit: Payer: Medicare Other | Admitting: Physical Therapy

## 2015-08-30 ENCOUNTER — Ambulatory Visit: Payer: Medicare Other | Admitting: Physical Therapy

## 2015-08-30 ENCOUNTER — Encounter: Payer: Self-pay | Admitting: Physical Therapy

## 2015-08-30 DIAGNOSIS — R269 Unspecified abnormalities of gait and mobility: Secondary | ICD-10-CM | POA: Diagnosis not present

## 2015-08-30 DIAGNOSIS — M792 Neuralgia and neuritis, unspecified: Secondary | ICD-10-CM | POA: Diagnosis not present

## 2015-08-30 DIAGNOSIS — R6889 Other general symptoms and signs: Secondary | ICD-10-CM

## 2015-08-30 DIAGNOSIS — R531 Weakness: Secondary | ICD-10-CM | POA: Diagnosis not present

## 2015-08-30 DIAGNOSIS — Z89512 Acquired absence of left leg below knee: Secondary | ICD-10-CM

## 2015-08-30 DIAGNOSIS — M545 Low back pain: Secondary | ICD-10-CM | POA: Diagnosis not present

## 2015-08-30 NOTE — Therapy (Signed)
[facility] [address] [place], [place], [address] Phone: [phone]   Fax:  [phone]  Physical Therapy Treatment  Patient Details  Name: Jennifer [name] MRN: [id number] Date of Birth: [birth year] Referring Provider:  Bartholomew Crews, MD  Encounter Date: 08/30/2015      PT End of Session - 08/30/15 1112    Visit Number 5   Number of Visits 18   Date for PT Re-Evaluation 10/12/15   Authorization Type Medicare G-Code 10th visit   PT Start Time 1104   PT Stop Time 1145   PT Time Calculation (min) 41 min   Equipment Utilized During Treatment Gait belt   Activity Tolerance Patient tolerated treatment well   Behavior During Therapy [facility] for tasks assessed/performed      Past Medical History  Diagnosis Date  . Depression   . GERD (gastroesophageal reflux disease)   . Hyperlipidemia   . Hypertension   . Glaucoma   . PVD (peripheral vascular disease) with claudication     Stents to bilateral common iliac arteries (left 2005, right 2008), on chronic plavix  . Hyperplastic colon polyp 12/2010    Per colonoscopy (12/2010) - Dr. Deatra Ina  . Asthma   . COPD 01/08/2007    PFT's 05/2007 : FEV1/FVC 82, FEV1 64% pred, FEF 25-75% 40% predicted, 16% improvement in FEV1 with bronchodilators.     . Tobacco abuse   . Chronic osteomyelitis of foot     chronic, right secondary to diabetic foot ulcers  . Polymicrobial bacterial infection 01/2013    GBS and S. aureus bacteremia // Source likely infected diabetic foot ulcer  . 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 Blood cultures 01/26/13 S. Aureus and GBS. Blood cultures Feb 6th, 8th, and 9th and March were negative.Repeat TEE 3/20 negative for vegitations  . 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 Chronic diastolic heart failure     grade 2 per 2D echocardiogram (01/2013)  . Ulcer of foot, chronic     Left.  No OM per MRI (01/2013)  . DVT of upper extremity (deep vein thrombosis) 03/11/2013    Secondary to PICC line. Right brachial vein, diagnosed on 03/10/2013 Coumadin for 3 months. End date 06/10/2013   . Chronic pain syndrome 12/03/2011    Likely secondary to depression, "fibromyalgia", neuropathy, and obesity. Lumbar MRI 2014 no sig change from prior (2008) : Stable hypertrophic facet disease most notable at L4-5. Stable shallow left foraminal/extraforaminal disc protrusion at L4-5. No direct neural compression.      . Environmental allergies     Hx: of  . Fatty liver 2003    observed on ultrasound abdomen  . CHF (congestive heart failure)   . Anginal pain     '3' of 10 ischemia ruled out 9/9   . Fibromyalgia   . Chronic bronchitis     "I get it alot" (09/28/2013)  . Exertional shortness of breath   . Rheumatoid arthritis   . Arthritis of lumbar spine   . Chronic lower back pain   . Diabetic peripheral neuropathy   . Type II diabetes mellitus with peripheral circulatory disorders, uncontrolled DX: 1993    Insulin dep. Poor control. Complicated by diabetic foot ulcer and diabetic eye disease.    . 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 2/2 tobacco use,  DM, and cont weight bearing on surgical wound and developed gangrene     Past Surgical History  Procedure Laterality Date  . Abdominal hysterectomy  1997    secondary to uterine fibroids  . Tubal ligation    . Shoulder arthroscopy w/ rotator cuff repair Bilateral   . Breast biopsy      multiple-benign per pt  . Wrist surgery Right     "for tumors" (09/28/2013)  . Ganglion cyst excision      multiple  . Other surgical history      stents in lower ext  . Tee without cardioversion N/A 01/31/2013    Procedure: TRANSESOPHAGEAL ECHOCARDIOGRAM (TEE);  Surgeon: Fay Records, MD;  Location: [facility];  Service: Cardiovascular;  Laterality: N/A;  Rm [phone]  . Bladder  surgery      bladder reconstruction surgery  . Tee without cardioversion N/A 03/10/2013    Procedure: TRANSESOPHAGEAL ECHOCARDIOGRAM (TEE);  Surgeon: Larey Dresser, MD;  Location: [place];  Service: Cardiovascular;  Laterality: N/A;  Rm. [phone]  . Skin split graft Bilateral 05/13/2013    Procedure: Right and Left Foot Allograft Skin Graft;  Surgeon: Newt Minion, MD;  Location: [facility];  Service: Orthopedics;  Laterality: Bilateral;  Right and Left Foot Allograft Skin Graft  . Toe amputation Left 08/31/2013    4TH & 5 TH TOE   . Amputation Left 08/31/2013    Procedure: AMPUTATION RAY;  Surgeon: Newt Minion, MD;  Location: [facility];  Service: Orthopedics;  Laterality: Left;  Left Foot 5th Ray Amputation  . Esophagogastroduodenoscopy N/A 09/20/2013    Procedure: ESOPHAGOGASTRODUODENOSCOPY (EGD);  Surgeon: Jerene Bears, MD;  Location: [facility];  Service: Gastroenterology;  Laterality: N/A;  . Foot amputation through metatarsal Left 09/28/2013  . Tonsillectomy    . Amputation Left 09/28/2013    Procedure: Left Midfoot amputation;  Surgeon: Newt Minion, MD;  Location: [place];  Service: Orthopedics;  Laterality: Left;  Left Midfoot amputation  . Amputation Left 10/14/2013    Procedure: AMPUTATION BELOW KNEE- left;  Surgeon: Newt Minion, MD;  Location: [facility];  Service: Orthopedics;  Laterality: Left;  Left Below Knee Amputation     There were no vitals filed for this visit.  Visit Diagnosis:  Weakness generalized  Status post below knee amputation of left lower extremity  Abnormality of gait  Activity intolerance  Decreased functional activity tolerance      Subjective Assessment - 08/30/15 1110    Subjective No new complaints. Reports doing HEP at home. Reports her fibromyalgia is acting up, "always in pain everywhere". Reports sleeping all day on Monday and Tuesday.   Currently in Pain? Yes   Pain Score 9    Pain Location Other (Comment)  everywhere today   Pain Orientation  Other (Comment)  global   Pain Descriptors / Indicators Burning;Aching   Pain Type Chronic pain   Pain Onset More than a month ago   Pain Frequency Constant   Aggravating Factors  increased activity, weather, fibromyalgia   Pain Relieving Factors medication, rest/sleeping          [facility] Adult PT Treatment/Exercise - 08/30/15 1113    Transfers   Sit to Stand 5: Supervision;With upper extremity assist;With armrests;From chair/3-in-1   Stand to Sit 5: Supervision;With upper extremity assist;With armrests;To chair/3-in-1   Ambulation/Gait   Ambulation/Gait Yes   Ambulation/Gait Assistance 5: Supervision   Ambulation Distance (Feet) 115 Feet  x 2 reps   Assistive device  Rolling walker;Prosthesis   Gait Pattern Step-to pattern;Decreased step length - right;Decreased stance time - left;Decreased stride length;Decreased hip/knee flexion - right;Decreased weight shift to right;Right hip hike;Trunk flexed;Abducted- right;Poor foot clearance - right   Ambulation Surface Level;Indoor   Prosthetics   Current prosthetic wear tolerance (days/week)  reports daily wear   Current prosthetic wear tolerance (#hours/day)  reports most of awake hours   Residual limb condition  intact except for callous.     Exercises Side stepping at counter x 3 laps each way with cues on form and technique Mini squats x 10 reps with cues on form and technique Hip abduction and hip extension. Alternating x 10 reps each leg. Cues on form and technique. Scifit with 4 extremities, level 1.5, with goal rpm >/= 40, x 8 minutes for strengthening and activity tolerance.   Self care: Pt educated on benefits of using rollator for increased activity in community with built in resting spot vs use of standard RW or wheelchair. Pt is concerned on cost as insurance will not cover one. She is having a family member check into repairing the breaks on one her mom had. She became more open to learning how to use the rollator after  discussion on how it can benefit her for increased community activity.        PT Short Term Goals - 08/13/15 1515    PT SHORT TERM GOAL #1   Title Patient demonstrates understanding of initail HEP with minimal cues. (Target Date: 09/12/2015)   Time 1   Period Months   Status New   PT SHORT TERM GOAL #2   Title patient verbalizes understanding of fall prevention strategies in home. (Target Date: 09/12/2015)   Time 1   Period Months   Status New   PT SHORT TERM GOAL #3   Title patient verbalizes understanding proper activity modification to assist with pain management. (Target Date: 09/12/2015)   Time 1   Period Months   Status New           PT Long Term Goals - 08/13/15 1515    PT LONG TERM GOAL #1   Title Patient verbalizes & demonstrates understanding of ongoing HEP / fitness plan. (Target Date: 10/12/2015)   Time 2   Period Months   Status New   PT LONG TERM GOAL #2   Title Patient demonstrates / verbalizes managing activity level to manage pain. (Target Date: 10/12/2015)   Time 2   Period Months   Status New   PT LONG TERM GOAL #3   Title Patient ambulates 250' with rolling walker or rollator walker & prosthesis modified independent.  (Target Date: 10/12/2015)   Time 2   Period Months   Status New   PT LONG TERM GOAL #4   Title Timed Up & Go with rolling walker & prosthesis <20 seconds safely to decrease risk of falls.  (Target Date: 10/12/2015)   Time 2   Period Months   Status New   PT LONG TERM GOAL #5   Title Berg Balance >20 / 56  (Target Date: 10/12/2015)   Time 2   Period Months   Status New   PT LONG TERM GOAL #6   Title Patient negotiates ramps & curbs with rolling walker & stairs with 1 rail with prosthesis with supervision.  (Target Date: 10/12/2015)   Time 2   Period Months   Status New   PT LONG TERM GOAL #7   Title Patient self reports via [place]  improvement in functional status by > 5 points.  (Target Date: 10/12/2015)   Time 2   Period Months    Status New           Plan - 08/30/15 1113    Clinical Impression Statement Pt with increased gait distance today vs with previous sessions. Did need rest breaks throughout session due to fatigue and pain. Pt making steady progress toward goals.   Pt will benefit from skilled therapeutic intervention in order to improve on the following deficits Abnormal gait;Decreased activity tolerance;Decreased balance;Decreased endurance;Decreased knowledge of use of DME;Decreased mobility;Decreased range of motion;Decreased strength;Impaired flexibility;Prosthetic Dependency;Postural dysfunction;Pain   Rehab Potential Good   Clinical Impairments Affecting Rehab Potential chronic medical conditions with non-compliance    PT Frequency 2x / week   PT Duration Other (comment)  9 weeks   PT Treatment/Interventions ADLs/Self Care Home Management;Moist Heat;DME Instruction;Gait training;Stair training;Functional mobility training;Therapeutic activities;Therapeutic exercise;Balance training;Neuromuscular re-education;Patient/family education;Prosthetic Training;Manual techniques;Passive range of motion   PT Next Visit Plan Gait with rollator for community based activities;continue with strengthening and activity tolerance activities.   Consulted and Agree with Plan of Care Patient        Problem List Patient Active Problem List   Diagnosis Date Noted  . Skin tag 08/08/2015  . Counseling regarding end of life decision making 06/14/2015  . Atherosclerosis of aorta 04/04/2015  . Anemia 10/05/2014  . Chronic diastolic heart failure   . S/P BKA (below knee amputation) unilateral   . Tobacco abuse   . Severe obesity (BMI >= 40) 03/02/2013  . Abnormality of gait 03/01/2013  . Infective endocarditis 01/22/2013  . Healthcare maintenance 07/10/2012  . Opioid dependence 12/03/2011  . PVD in DM 08/27/2011  . GLAUCOMA 11/29/2009  . Essential hypertension 11/29/2009  . Chronic insomnia 10/25/2009  .  GASTROESOPHAGEAL REFLUX DISEASE 11/24/2008  . Depression 04/06/2008  . DM (diabetes mellitus) type II uncontrolled, periph vascular disorder 04/02/2007  . Hyperlipemia 01/08/2007  . COPD (chronic obstructive pulmonary disease) 01/08/2007    Willow Ora 08/30/2015, 10:06 PM  Willow Ora, PTA, [facility] [address] [place], [place] [address] [phone] 08/30/2015, 10:06 PM

## 2015-08-31 ENCOUNTER — Ambulatory Visit: Payer: Medicare Other

## 2015-08-31 VITALS — BP 167/82 | HR 70

## 2015-08-31 DIAGNOSIS — R531 Weakness: Secondary | ICD-10-CM | POA: Diagnosis not present

## 2015-08-31 DIAGNOSIS — R6889 Other general symptoms and signs: Secondary | ICD-10-CM

## 2015-08-31 DIAGNOSIS — Z89512 Acquired absence of left leg below knee: Secondary | ICD-10-CM | POA: Diagnosis not present

## 2015-08-31 DIAGNOSIS — R269 Unspecified abnormalities of gait and mobility: Secondary | ICD-10-CM | POA: Diagnosis not present

## 2015-08-31 DIAGNOSIS — M792 Neuralgia and neuritis, unspecified: Secondary | ICD-10-CM | POA: Diagnosis not present

## 2015-08-31 DIAGNOSIS — M545 Low back pain: Secondary | ICD-10-CM | POA: Diagnosis not present

## 2015-08-31 NOTE — Patient Instructions (Signed)
Fall Prevention and Home Safety Falls cause injuries and can affect all age groups. It is possible to use preventive measures to significantly decrease the likelihood of falls. There are many simple measures which can make your home safer and prevent falls. OUTDOORS  Repair cracks and edges of walkways and driveways.  Remove high doorway thresholds.  Trim shrubbery on the main path into your home.  Have good outside lighting.  Clear walkways of tools, rocks, debris, and clutter.  Check that handrails are not broken and are securely fastened. Both sides of steps should have handrails.  Have leaves, snow, and ice cleared regularly.  Use sand or salt on walkways during winter months.  In the garage, clean up grease or oil spills. BATHROOM  Install night lights.  Install grab bars by the toilet and in the tub and shower.  Use non-skid mats or decals in the tub or shower.  Place a plastic non-slip stool in the shower to sit on, if needed.  Keep floors dry and clean up all water on the floor immediately.  Remove soap buildup in the tub or shower on a regular basis.  Secure bath mats with non-slip, double-sided rug tape.  Remove throw rugs and tripping hazards from the floors. BEDROOMS  Install night lights.  Make sure a bedside light is easy to reach.  Do not use oversized bedding.  Keep a telephone by your bedside.  Have a firm chair with side arms to use for getting dressed.  Remove throw rugs and tripping hazards from the floor. KITCHEN  Keep handles on pots and pans turned toward the center of the stove. Use back burners when possible.  Clean up spills quickly and allow time for drying.  Avoid walking on wet floors.  Avoid hot utensils and knives.  Position shelves so they are not too high or low.  Place commonly used objects within easy reach.  If necessary, use a sturdy step stool with a grab bar when reaching.  Keep electrical cables out of the  way.  Do not use floor polish or wax that makes floors slippery. If you must use wax, use non-skid floor wax.  Remove throw rugs and tripping hazards from the floor. STAIRWAYS  Never leave objects on stairs.  Place handrails on both sides of stairways and use them. Fix any loose handrails. Make sure handrails on both sides of the stairways are as long as the stairs.  Check carpeting to make sure it is firmly attached along stairs. Make repairs to worn or loose carpet promptly.  Avoid placing throw rugs at the top or bottom of stairways, or properly secure the rug with carpet tape to prevent slippage. Get rid of throw rugs, if possible.  Have an electrician put in a light switch at the top and bottom of the stairs. OTHER FALL PREVENTION TIPS  Wear low-heel or rubber-soled shoes that are supportive and fit well. Wear closed toe shoes.  When using a stepladder, make sure it is fully opened and both spreaders are firmly locked. Do not climb a closed stepladder.  Add color or contrast paint or tape to grab bars and handrails in your home. Place contrasting color strips on first and last steps.  Learn and use mobility aids as needed. Install an electrical emergency response system.  Turn on lights to avoid dark areas. Replace light bulbs that burn out immediately. Get light switches that glow.  Arrange furniture to create clear pathways. Keep furniture in the same place.    Firmly attach carpet with non-skid or double-sided tape.  Eliminate uneven floor surfaces.  Select a carpet pattern that does not visually hide the edge of steps.  Be aware of all pets. OTHER HOME SAFETY TIPS  Set the water temperature for 120 F (48.8 C).  Keep emergency numbers on or near the telephone.  Keep smoke detectors on every level of the home and near sleeping areas. Document Released: 11/28/2002 Document Revised: 06/08/2012 Document Reviewed: 02/27/2012 [employer] Patient Information 2015  [employer]. This information is not intended to replace advice given to you by your health care provider. Make sure you discuss any questions you have with your health care provider.  

## 2015-08-31 NOTE — Therapy (Signed)
[facility] [address] [place], [place], [address] Phone: [phone]   Fax:  [phone]  Physical Therapy Treatment  Patient Details  Name: Jennifer [name] MRN: [id number] Date of Birth: [birth year] Referring Provider:  Bartholomew Crews, MD  Encounter Date: 08/31/2015      PT End of Session - 08/31/15 1351    Visit Number 6   Number of Visits 18   Date for PT Re-Evaluation 10/12/15   Authorization Type Medicare G-Code 10th visit   PT Start Time 9935   PT Stop Time 1358   PT Time Calculation (min) 41 min   Equipment Utilized During Treatment Gait belt   Activity Tolerance Patient tolerated treatment well   Behavior During Therapy [facility] for tasks assessed/performed      Past Medical History  Diagnosis Date  . Depression   . GERD (gastroesophageal reflux disease)   . Hyperlipidemia   . Hypertension   . Glaucoma   . PVD (peripheral vascular disease) with claudication     Stents to bilateral common iliac arteries (left 2005, right 2008), on chronic plavix  . Hyperplastic colon polyp 12/2010    Per colonoscopy (12/2010) - Dr. Deatra Ina  . Asthma   . COPD 01/08/2007    PFT's 05/2007 : FEV1/FVC 82, FEV1 64% pred, FEF 25-75% 40% predicted, 16% improvement in FEV1 with bronchodilators.     . Tobacco abuse   . Chronic osteomyelitis of foot     chronic, right secondary to diabetic foot ulcers  . Polymicrobial bacterial infection 01/2013    GBS and S. aureus bacteremia // Source likely infected diabetic foot ulcer  . 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 Blood cultures 01/26/13 S. Aureus and GBS. Blood cultures Feb 6th, 8th, and 9th and March were negative.Repeat TEE 3/20 negative for vegitations  . 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 Chronic diastolic heart failure     grade 2 per 2D echocardiogram (01/2013)  . Ulcer of foot, chronic     Left.  No OM per MRI (01/2013)  . DVT of upper extremity (deep vein thrombosis) 03/11/2013    Secondary to PICC line. Right brachial vein, diagnosed on 03/10/2013 Coumadin for 3 months. End date 06/10/2013   . Chronic pain syndrome 12/03/2011    Likely secondary to depression, "fibromyalgia", neuropathy, and obesity. Lumbar MRI 2014 no sig change from prior (2008) : Stable hypertrophic facet disease most notable at L4-5. Stable shallow left foraminal/extraforaminal disc protrusion at L4-5. No direct neural compression.      . Environmental allergies     Hx: of  . Fatty liver 2003    observed on ultrasound abdomen  . CHF (congestive heart failure)   . Anginal pain     '3' of 10 ischemia ruled out 9/9   . Fibromyalgia   . Chronic bronchitis     "I get it alot" (09/28/2013)  . Exertional shortness of breath   . Rheumatoid arthritis   . Arthritis of lumbar spine   . Chronic lower back pain   . Diabetic peripheral neuropathy   . Type II diabetes mellitus with peripheral circulatory disorders, uncontrolled DX: 1993    Insulin dep. Poor control. Complicated by diabetic foot ulcer and diabetic eye disease.    . 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 2/2 tobacco use,  DM, and cont weight bearing on surgical wound and developed gangrene     Past Surgical History  Procedure Laterality Date  . Abdominal hysterectomy  1997    secondary to uterine fibroids  . Tubal ligation    . Shoulder arthroscopy w/ rotator cuff repair Bilateral   . Breast biopsy      multiple-benign per pt  . Wrist surgery Right     "for tumors" (09/28/2013)  . Ganglion cyst excision      multiple  . Other surgical history      stents in lower ext  . Tee without cardioversion N/A 01/31/2013    Procedure: TRANSESOPHAGEAL ECHOCARDIOGRAM (TEE);  Surgeon: Fay Records, MD;  Location: [facility];  Service: Cardiovascular;  Laterality: N/A;  Rm [phone]  . Bladder  surgery      bladder reconstruction surgery  . Tee without cardioversion N/A 03/10/2013    Procedure: TRANSESOPHAGEAL ECHOCARDIOGRAM (TEE);  Surgeon: Larey Dresser, MD;  Location: [facility];  Service: Cardiovascular;  Laterality: N/A;  Rm. [id number]  . Skin split graft Bilateral 05/13/2013    Procedure: Right and Left Foot Allograft Skin Graft;  Surgeon: Newt Minion, MD;  Location: [facility];  Service: Orthopedics;  Laterality: Bilateral;  Right and Left Foot Allograft Skin Graft  . Toe amputation Left 08/31/2013    4TH & 5 TH TOE   . Amputation Left 08/31/2013    Procedure: AMPUTATION RAY;  Surgeon: Newt Minion, MD;  Location: [facility];  Service: Orthopedics;  Laterality: Left;  Left Foot 5th Ray Amputation  . Esophagogastroduodenoscopy N/A 09/20/2013    Procedure: ESOPHAGOGASTRODUODENOSCOPY (EGD);  Surgeon: Jerene Bears, MD;  Location: [facility];  Service: Gastroenterology;  Laterality: N/A;  . Foot amputation through metatarsal Left 09/28/2013  . Tonsillectomy    . Amputation Left 09/28/2013    Procedure: Left Midfoot amputation;  Surgeon: Newt Minion, MD;  Location: [facility];  Service: Orthopedics;  Laterality: Left;  Left Midfoot amputation  . Amputation Left 10/14/2013    Procedure: AMPUTATION BELOW KNEE- left;  Surgeon: Newt Minion, MD;  Location: [facility];  Service: Orthopedics;  Laterality: Left;  Left Below Knee Amputation     Filed Vitals:   08/31/15 1319  BP: 167/82  Pulse: 70    Visit Diagnosis:  Abnormality of gait  Activity intolerance  Weakness generalized      Subjective Assessment - 08/31/15 1319    Subjective Pt reported she almost fell today, as her hair got in her face but she was able to self correct with hands on RW. Pt reported she has a yeast infection under L breast, she reports she's going to go to [facility].   Patient is accompained by: Family member  Corey-pt's son   Patient Stated Goals To use prosthesis to walk further & not have to worry about falling.    Currently in Pain? Yes   Pain Score 10-Worst pain ever   Pain Location --  "I have pain all over"   Pain Orientation Other (Comment)  pain all over   Pain Descriptors / Indicators Aching;Burning   Pain Type Chronic pain   Pain Onset More than a month ago   Pain Frequency Constant   Aggravating Factors  standing too long, change in weather   Pain Relieving Factors medication helps a little, rest                         [facility] Adult  PT Treatment/Exercise - 08/31/15 1328    Ambulation/Gait   Ambulation/Gait Yes   Ambulation/Gait Assistance 5: Supervision   Ambulation/Gait Assistance Details Pt ambulated with and without performing head turns. Pt noted to experience decrease in gait speed while performing head turns. Pt required seated rest breaks after each bout of amb. 2/2 fatigue. Cues to stay within rollator.   Ambulation Distance (Feet) 115 Feet  x4   Assistive device Prosthesis;Rollator   Gait Pattern Step-to pattern;Decreased step length - right;Decreased stance time - left;Decreased stride length;Decreased hip/knee flexion - right;Decreased weight shift to right;Right hip hike;Trunk flexed;Abducted- right;Poor foot clearance - right   Ambulation Surface Indoor;Level   Balance   Balance Assessed Yes   Dynamic Standing Balance   Dynamic Standing - Balance Support No upper extremity supported   Dynamic Standing - Level of Assistance Other (comment)  min guard   Dynamic Standing - Comments Pt performed reaching down to floor and reaching approx. 7" outside BOS with feet apart in staggered stance (L LE in front of R LE) to improve support. Cues for technique.                PT Education - 08/31/15 1350    Education provided Yes   Education Details Fall prevention education handout and staggered stance when reaching outside BOS and picking up object from floor.   Person(s) Educated Patient   Methods Explanation;Demonstration;Verbal cues;Handout    Comprehension Returned demonstration;Verbalized understanding;Need further instruction          PT Short Term Goals - 08/31/15 1459    PT SHORT TERM GOAL #1   Title Patient demonstrates understanding of initail HEP with minimal cues. (Target Date: 09/12/2015)   Time 1   Period Months   Status On-going   PT SHORT TERM GOAL #2   Title patient verbalizes understanding of fall prevention strategies in home. (Target Date: 09/12/2015)   Time 1   Period Months   Status On-going   PT SHORT TERM GOAL #3   Title patient verbalizes understanding proper activity modification to assist with pain management. (Target Date: 09/12/2015)   Time 1   Period Months   Status On-going           PT Long Term Goals - 08/31/15 1500    PT LONG TERM GOAL #1   Title Patient verbalizes & demonstrates understanding of ongoing HEP / fitness plan. (Target Date: 10/12/2015)   Time 2   Period Months   Status On-going   PT LONG TERM GOAL #2   Title Patient demonstrates / verbalizes managing activity level to manage pain. (Target Date: 10/12/2015)   Time 2   Period Months   Status On-going   PT LONG TERM GOAL #3   Title Patient ambulates 250' with rolling walker or rollator walker & prosthesis modified independent.  (Target Date: 10/12/2015)   Time 2   Period Months   Status On-going   PT LONG TERM GOAL #4   Title Timed Up & Go with rolling walker & prosthesis <20 seconds safely to decrease risk of falls.  (Target Date: 10/12/2015)   Time 2   Period Months   Status On-going   PT LONG TERM GOAL #5   Title Berg Balance >20 / 56  (Target Date: 10/12/2015)   Time 2   Period Months   Status On-going   PT LONG TERM GOAL #6   Title Patient negotiates ramps & curbs with rolling walker & stairs with 1 rail with prosthesis  with supervision.  (Target Date: 10/12/2015)   Time 2   Period Months   Status On-going   PT LONG TERM GOAL #7   Title Patient self reports via FOTO improvement in functional status by >  5 points.  (Target Date: 10/12/2015)   Time 2   Period Months   Status On-going               Plan - 08/31/15 1352    Clinical Impression Statement Pt demonstrated progress, as she was able to amb. while performing head turns today. Pt continues to require seated rest breaks during session 2/2 fatigue. Pt's BP slightly elevated at rest, as pt reported she did not take BP meds today. PT reiterated the importance of taking medication as prescribed by MD. Pt would continue to benefit from skilled PT to improve safety during functional mobility.   Pt will benefit from skilled therapeutic intervention in order to improve on the following deficits Abnormal gait;Decreased activity tolerance;Decreased balance;Decreased endurance;Decreased knowledge of use of DME;Decreased mobility;Decreased range of motion;Decreased strength;Impaired flexibility;Prosthetic Dependency;Postural dysfunction;Pain   Rehab Potential Good   Clinical Impairments Affecting Rehab Potential chronic medical conditions with non-compliance    PT Frequency 2x / week   PT Duration Other (comment)  9 weeks   PT Treatment/Interventions ADLs/Self Care Home Management;Moist Heat;DME Instruction;Gait training;Stair training;Functional mobility training;Therapeutic activities;Therapeutic exercise;Balance training;Neuromuscular re-education;Patient/family education;Prosthetic Training;Manual techniques;Passive range of motion   PT Next Visit Plan Gait with rollator for community based activities;continue with strengthening and activity tolerance activities.   Family Member Consulted son, Georgina Snell        Problem List Patient Active Problem List   Diagnosis Date Noted  . Skin tag 08/08/2015  . Counseling regarding end of life decision making 06/14/2015  . Atherosclerosis of aorta 04/04/2015  . Anemia 10/05/2014  . Chronic diastolic heart failure   . S/P BKA (below knee amputation) unilateral   . Tobacco abuse   . Severe obesity  (BMI >= 40) 03/02/2013  . Abnormality of gait 03/01/2013  . Infective endocarditis 01/22/2013  . Healthcare maintenance 07/10/2012  . Opioid dependence 12/03/2011  . PVD in DM 08/27/2011  . GLAUCOMA 11/29/2009  . Essential hypertension 11/29/2009  . Chronic insomnia 10/25/2009  . GASTROESOPHAGEAL REFLUX DISEASE 11/24/2008  . Depression 04/06/2008  . DM (diabetes mellitus) type II uncontrolled, periph vascular disorder 04/02/2007  . Hyperlipemia 01/08/2007  . COPD (chronic obstructive pulmonary disease) 01/08/2007    Ekin Pilar L 08/31/2015, 3:01 PM  [facility] [address] [place], [place], [address] Phone: [phone]   Fax:  [phone]     Geoffry Paradise, PT,DPT 08/31/2015 3:01 PM Phone: [phone] Fax: [phone]

## 2015-09-03 ENCOUNTER — Ambulatory Visit: Payer: Medicare Other | Admitting: Physical Therapy

## 2015-09-05 ENCOUNTER — Encounter: Payer: Self-pay | Admitting: Physical Therapy

## 2015-09-05 ENCOUNTER — Encounter: Payer: Self-pay | Admitting: Internal Medicine

## 2015-09-05 ENCOUNTER — Ambulatory Visit: Payer: Medicare Other | Admitting: Physical Therapy

## 2015-09-05 VITALS — BP 121/95 | HR 58

## 2015-09-05 DIAGNOSIS — M792 Neuralgia and neuritis, unspecified: Secondary | ICD-10-CM | POA: Diagnosis not present

## 2015-09-05 DIAGNOSIS — R269 Unspecified abnormalities of gait and mobility: Secondary | ICD-10-CM | POA: Diagnosis not present

## 2015-09-05 DIAGNOSIS — R6889 Other general symptoms and signs: Secondary | ICD-10-CM

## 2015-09-05 DIAGNOSIS — M545 Low back pain: Secondary | ICD-10-CM

## 2015-09-05 DIAGNOSIS — Z89512 Acquired absence of left leg below knee: Secondary | ICD-10-CM | POA: Diagnosis not present

## 2015-09-05 DIAGNOSIS — E08319 Diabetes mellitus due to underlying condition with unspecified diabetic retinopathy without macular edema: Secondary | ICD-10-CM | POA: Insufficient documentation

## 2015-09-05 DIAGNOSIS — E0865 Diabetes mellitus due to underlying condition with hyperglycemia: Secondary | ICD-10-CM

## 2015-09-05 DIAGNOSIS — E11319 Type 2 diabetes mellitus with unspecified diabetic retinopathy without macular edema: Secondary | ICD-10-CM | POA: Insufficient documentation

## 2015-09-05 DIAGNOSIS — R531 Weakness: Secondary | ICD-10-CM | POA: Diagnosis not present

## 2015-09-05 DIAGNOSIS — IMO0002 Reserved for concepts with insufficient information to code with codable children: Secondary | ICD-10-CM | POA: Insufficient documentation

## 2015-09-05 NOTE — Therapy (Signed)
[facility] [address] [place], [place], [address] Phone: [phone]   Fax:  [phone]  Physical Therapy Treatment  Patient Details  Name: Jennifer [name] MRN: [id number] Date of Birth: Jan 19, 1961 Referring Provider:  Bartholomew Crews, MD  Encounter Date: 09/05/2015      PT End of Session - 09/05/15 1240    Visit Number 7   Number of Visits 18   Date for PT Re-Evaluation 10/12/15   Authorization Type Medicare G-Code 10th visit   PT Start Time 3976   PT Stop Time 1315   PT Time Calculation (min) 40 min   Equipment Utilized During Treatment Gait belt   Activity Tolerance Patient tolerated treatment well   Behavior During Therapy [facility] for tasks assessed/performed      Past Medical History  Diagnosis Date  . Depression   . GERD (gastroesophageal reflux disease)   . Hyperlipidemia   . Hypertension   . Glaucoma   . PVD (peripheral vascular disease) with claudication     Stents to bilateral common iliac arteries (left 2005, right 2008), on chronic plavix  . Hyperplastic colon polyp 12/2010    Per colonoscopy (12/2010) - Dr. Deatra Ina  . Asthma   . COPD 01/08/2007    PFT's 05/2007 : FEV1/FVC 82, FEV1 64% pred, FEF 25-75% 40% predicted, 16% improvement in FEV1 with bronchodilators.     . Tobacco abuse   . Chronic osteomyelitis of foot     chronic, right secondary to diabetic foot ulcers  . Polymicrobial bacterial infection 01/2013    GBS and S. aureus bacteremia // Source likely infected diabetic foot ulcer  . 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 Blood cultures 01/26/13 S. Aureus and GBS. Blood cultures Feb 6th, 8th, and 9th and March were negative.Repeat TEE 3/20 negative for vegitations  . 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 Chronic diastolic heart failure     grade 2 per 2D echocardiogram (01/2013)  . Ulcer of foot, chronic    Left. No OM per MRI (01/2013)  . DVT of upper extremity (deep vein thrombosis) 03/11/2013    Secondary to PICC line. Right brachial vein, diagnosed on 03/10/2013 Coumadin for 3 months. End date 06/10/2013   . Chronic pain syndrome 12/03/2011    Likely secondary to depression, "fibromyalgia", neuropathy, and obesity. Lumbar MRI 2014 no sig change from prior (2008) : Stable hypertrophic facet disease most notable at L4-5. Stable shallow left foraminal/extraforaminal disc protrusion at L4-5. No direct neural compression.      . Environmental allergies     Hx: of  . Fatty liver 2003    observed on ultrasound abdomen  . CHF (congestive heart failure)   . Anginal pain     '3' of 10 ischemia ruled out 9/9   . Fibromyalgia   . Chronic bronchitis     "I get it alot" (09/28/2013)  . Exertional shortness of breath   . Rheumatoid arthritis   . Arthritis of lumbar spine   . Chronic lower back pain   . Diabetic peripheral neuropathy   . Type II diabetes mellitus with peripheral circulatory disorders, uncontrolled DX: 1993    Insulin dep. Poor control. Complicated by diabetic foot ulcer and diabetic eye disease.    . 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 2/2 tobacco use, DM, and  cont weight bearing on surgical wound and developed gangrene     Past Surgical History  Procedure Laterality Date  . Abdominal hysterectomy  1997    secondary to uterine fibroids  . Tubal ligation    . Shoulder arthroscopy w/ rotator cuff repair Bilateral   . Breast biopsy      multiple-benign per pt  . Wrist surgery Right     "for tumors" (09/28/2013)  . Ganglion cyst excision      multiple  . Other surgical history      stents in lower ext  . Tee without cardioversion N/A 01/31/2013    Procedure: TRANSESOPHAGEAL ECHOCARDIOGRAM (TEE);  Surgeon: Fay Records, MD;  Location: [place];  Service: Cardiovascular;  Laterality: N/A;  Rm [phone]  .  Bladder surgery      bladder reconstruction surgery  . Tee without cardioversion N/A 03/10/2013    Procedure: TRANSESOPHAGEAL ECHOCARDIOGRAM (TEE);  Surgeon: Larey Dresser, MD;  Location: [place];  Service: Cardiovascular;  Laterality: N/A;  Rm. 4730  . Skin split graft Bilateral 05/13/2013    Procedure: Right and Left Foot Allograft Skin Graft;  Surgeon: Newt Minion, MD;  Location: [place];  Service: Orthopedics;  Laterality: Bilateral;  Right and Left Foot Allograft Skin Graft  . Toe amputation Left 08/31/2013    4TH & 5 TH TOE   . Amputation Left 08/31/2013    Procedure: AMPUTATION RAY;  Surgeon: Newt Minion, MD;  Location: [place];  Service: Orthopedics;  Laterality: Left;  Left Foot 5th Ray Amputation  . Esophagogastroduodenoscopy N/A 09/20/2013    Procedure: ESOPHAGOGASTRODUODENOSCOPY (EGD);  Surgeon: Jerene Bears, MD;  Location: [place];  Service: Gastroenterology;  Laterality: N/A;  . Foot amputation through metatarsal Left 09/28/2013  . Tonsillectomy    . Amputation Left 09/28/2013    Procedure: Left Midfoot amputation;  Surgeon: Newt Minion, MD;  Location: [place];  Service: Orthopedics;  Laterality: Left;  Left Midfoot amputation  . Amputation Left 10/14/2013    Procedure: AMPUTATION BELOW KNEE- left;  Surgeon: Newt Minion, MD;  Location: [place];  Service: Orthopedics;  Laterality: Left;  Left Below Knee Amputation     Filed Vitals:   09/05/15 1237  BP: 121/95  Pulse: 58    Visit Diagnosis:  Abnormality of gait  Activity intolerance  Weakness generalized  Status post below knee amputation of left lower extremity  Decreased functional activity tolerance  Midline low back pain, with sciatica presence unspecified      Subjective Assessment - 09/05/15 1237    Subjective Reports she was sick all weekend. Also still having "pain all over", especially in her hips/legs. No new falls.    Currently in Pain? Yes   Pain Score 10-Worst pain ever   Pain Location Other  (Comment)  "i have pain all over"   Pain Orientation Other (Comment)  "all over"   Pain Descriptors / Indicators Aching;Sore;Burning;Jabbing;Discomfort   Pain Type Chronic pain   Pain Onset More than a month ago   Pain Frequency Constant   Aggravating Factors  increased activity, weather changes   Pain Relieving Factors medication, rest    Effect of Pain on Daily Activities limits activities            [facility] PT Treatment/Exercise - 09/05/15 1244    Transfers   Sit to Stand 5: Supervision;With upper extremity assist;With armrests;From chair/3-in-1   Sit to Stand Details Verbal cues for safe use of DME/AE  Stand to Sit 5: Supervision;With upper extremity assist;With armrests;To chair/3-in-1   Stand to Sit Details (indicate cue type and reason) Verbal cues for safe use of DME/AE;Verbal cues for precautions/safety   Ambulation/Gait   Ambulation/Gait Yes   Ambulation/Gait Assistance 5: Supervision;4: Min guard  min guard occasionally due to rollator getting too far away   Ambulation/Gait Assistance Details cues on posture, rollator position with gait and safety with rollator   Ambulation Distance (Feet) 115 Feet  x 3 laps   Assistive device Prosthesis;Rollator   Gait Pattern Step-to pattern;Decreased step length - right;Decreased stance time - left;Decreased stride length;Decreased hip/knee flexion - right;Decreased weight shift to right;Right hip hike;Trunk flexed;Abducted- right;Poor foot clearance - right   Ambulation Surface Level;Indoor   Prosthetics   Current prosthetic wear tolerance (days/week)  reports daily wear   Current prosthetic wear tolerance (#hours/day)  reports most of awake hours   Residual limb condition  intact except for callous.      Exercises: At counter top for support. Cues on posture and ex form. Side stepping left/right x 2 laps each way Mini squats x 10 reps alternating hip abduction x 10 each leg         PT Short Term Goals - 08/31/15  1459    PT SHORT TERM GOAL #1   Title Patient demonstrates understanding of initail HEP with minimal cues. (Target Date: 09/12/2015)   Time 1   Period Months   Status On-going   PT SHORT TERM GOAL #2   Title patient verbalizes understanding of fall prevention strategies in home. (Target Date: 09/12/2015)   Time 1   Period Months   Status On-going   PT SHORT TERM GOAL #3   Title patient verbalizes understanding proper activity modification to assist with pain management. (Target Date: 09/12/2015)   Time 1   Period Months   Status On-going           PT Long Term Goals - 08/31/15 1500    PT LONG TERM GOAL #1   Title Patient verbalizes & demonstrates understanding of ongoing HEP / fitness plan. (Target Date: 10/12/2015)   Time 2   Period Months   Status On-going   PT LONG TERM GOAL #2   Title Patient demonstrates / verbalizes managing activity level to manage pain. (Target Date: 10/12/2015)   Time 2   Period Months   Status On-going   PT LONG TERM GOAL #3   Title Patient ambulates 250' with rolling walker or rollator walker & prosthesis modified independent.  (Target Date: 10/12/2015)   Time 2   Period Months   Status On-going   PT LONG TERM GOAL #4   Title Timed Up & Go with rolling walker & prosthesis <20 seconds safely to decrease risk of falls.  (Target Date: 10/12/2015)   Time 2   Period Months   Status On-going   PT LONG TERM GOAL #5   Title Berg Balance >20 / 56  (Target Date: 10/12/2015)   Time 2   Period Months   Status On-going   PT LONG TERM GOAL #6   Title Patient negotiates ramps & curbs with rolling walker & stairs with 1 rail with prosthesis with supervision.  (Target Date: 10/12/2015)   Time 2   Period Months   Status On-going   PT LONG TERM GOAL #7   Title Patient self reports via FOTO improvement in functional status by > 5 points.  (Target Date: 10/12/2015)   Time 2   Period  Months   Status On-going           Plan - 09/05/15 1240     Clinical Impression Statement Pt continues to fatigue with activity, needing rest breaks. Did report a minor increase in pain with activity today that resolved with resting.  Making steady progress toward goals,    Pt will benefit from skilled therapeutic intervention in order to improve on the following deficits Abnormal gait;Decreased activity tolerance;Decreased balance;Decreased endurance;Decreased knowledge of use of DME;Decreased mobility;Decreased range of motion;Decreased strength;Impaired flexibility;Prosthetic Dependency;Postural dysfunction;Pain   Rehab Potential Good   Clinical Impairments Affecting Rehab Potential chronic medical conditions with non-compliance    PT Frequency 2x / week   PT Duration Other (comment)  9 weeks   PT Treatment/Interventions ADLs/Self Care Home Management;Moist Heat;DME Instruction;Gait training;Stair training;Functional mobility training;Therapeutic activities;Therapeutic exercise;Balance training;Neuromuscular re-education;Patient/family education;Prosthetic Training;Manual techniques;Passive range of motion   PT Next Visit Plan Gait with rollator for community based activities;continue with strengthening and activity tolerance activities.   Family Member Consulted son, Georgina Snell        Problem List Patient Active Problem List   Diagnosis Date Noted  . Skin tag 08/08/2015  . Counseling regarding end of life decision making 06/14/2015  . Atherosclerosis of aorta 04/04/2015  . Anemia 10/05/2014  . Chronic diastolic heart failure   . S/P BKA (below knee amputation) unilateral   . Tobacco abuse   . Severe obesity (BMI >= 40) 03/02/2013  . Abnormality of gait 03/01/2013  . Infective endocarditis 01/22/2013  . Healthcare maintenance 07/10/2012  . Opioid dependence 12/03/2011  . PVD in DM 08/27/2011  . GLAUCOMA 11/29/2009  . Essential hypertension 11/29/2009  . Chronic insomnia 10/25/2009  . GASTROESOPHAGEAL REFLUX DISEASE 11/24/2008  . Depression  04/06/2008  . DM (diabetes mellitus) type II uncontrolled, periph vascular disorder 04/02/2007  . Hyperlipemia 01/08/2007  . COPD (chronic obstructive pulmonary disease) 01/08/2007    Willow Ora 09/05/2015, 4:04 PM  Willow Ora, PTA, [facility] [address], [place], [place] [address] [phone] 09/05/2015, 4:04 PM

## 2015-09-06 ENCOUNTER — Encounter: Payer: Self-pay | Admitting: Internal Medicine

## 2015-09-06 ENCOUNTER — Ambulatory Visit (INDEPENDENT_AMBULATORY_CARE_PROVIDER_SITE_OTHER): Payer: Medicare Other | Admitting: Internal Medicine

## 2015-09-06 VITALS — BP 143/64 | HR 62 | Temp 98.3°F | Ht 66.0 in | Wt 280.3 lb

## 2015-09-06 DIAGNOSIS — I10 Essential (primary) hypertension: Secondary | ICD-10-CM

## 2015-09-06 DIAGNOSIS — Z89512 Acquired absence of left leg below knee: Secondary | ICD-10-CM

## 2015-09-06 DIAGNOSIS — IMO0002 Reserved for concepts with insufficient information to code with codable children: Secondary | ICD-10-CM

## 2015-09-06 DIAGNOSIS — E08319 Diabetes mellitus due to underlying condition with unspecified diabetic retinopathy without macular edema: Secondary | ICD-10-CM

## 2015-09-06 DIAGNOSIS — F1120 Opioid dependence, uncomplicated: Secondary | ICD-10-CM | POA: Diagnosis not present

## 2015-09-06 DIAGNOSIS — E1121 Type 2 diabetes mellitus with diabetic nephropathy: Secondary | ICD-10-CM

## 2015-09-06 DIAGNOSIS — Z23 Encounter for immunization: Secondary | ICD-10-CM | POA: Diagnosis not present

## 2015-09-06 DIAGNOSIS — F329 Major depressive disorder, single episode, unspecified: Secondary | ICD-10-CM

## 2015-09-06 DIAGNOSIS — E1151 Type 2 diabetes mellitus with diabetic peripheral angiopathy without gangrene: Secondary | ICD-10-CM

## 2015-09-06 DIAGNOSIS — E0865 Diabetes mellitus due to underlying condition with hyperglycemia: Secondary | ICD-10-CM

## 2015-09-06 DIAGNOSIS — L304 Erythema intertrigo: Secondary | ICD-10-CM

## 2015-09-06 DIAGNOSIS — I798 Other disorders of arteries, arterioles and capillaries in diseases classified elsewhere: Secondary | ICD-10-CM

## 2015-09-06 DIAGNOSIS — F32A Depression, unspecified: Secondary | ICD-10-CM

## 2015-09-06 DIAGNOSIS — Z720 Tobacco use: Secondary | ICD-10-CM

## 2015-09-06 DIAGNOSIS — F17200 Nicotine dependence, unspecified, uncomplicated: Secondary | ICD-10-CM

## 2015-09-06 DIAGNOSIS — Z0000 Encounter for general adult medical examination without abnormal findings: Secondary | ICD-10-CM

## 2015-09-06 DIAGNOSIS — E11319 Type 2 diabetes mellitus with unspecified diabetic retinopathy without macular edema: Secondary | ICD-10-CM

## 2015-09-06 DIAGNOSIS — E1159 Type 2 diabetes mellitus with other circulatory complications: Secondary | ICD-10-CM | POA: Diagnosis not present

## 2015-09-06 DIAGNOSIS — F332 Major depressive disorder, recurrent severe without psychotic features: Secondary | ICD-10-CM

## 2015-09-06 DIAGNOSIS — E1165 Type 2 diabetes mellitus with hyperglycemia: Secondary | ICD-10-CM

## 2015-09-06 DIAGNOSIS — E113299 Type 2 diabetes mellitus with mild nonproliferative diabetic retinopathy without macular edema, unspecified eye: Secondary | ICD-10-CM

## 2015-09-06 MED ORDER — BACITRACIN-POLYMYXIN B 500-10000 UNIT/GM EX OINT: TOPICAL_OINTMENT | Freq: Two times a day (BID) | CUTANEOUS | Status: DC

## 2015-09-06 MED ORDER — CLOTRIMAZOLE-BETAMETHASONE 1-0.05 % EX CREA: TOPICAL_CREAM | CUTANEOUS | Status: DC

## 2015-09-06 NOTE — Progress Notes (Signed)
   Subjective:    [name], female    DOB: 1961/05/15, 54 y.o.   MRN: [id number]  HPI  Jennifer [name] is here for R leg pain. Please see the A&P for the status of the pt's chronic medical problems.  Soc hx : both sons now living with her. Apartment not WC accessible. 1.5 PPD  Review of Systems  Musculoskeletal: Positive for myalgias, arthralgias and gait problem.  Skin: Positive for wound.  Psychiatric/Behavioral: Negative for suicidal ideas.  For leg pain, see A&P     Objective:   Physical Exam  Constitutional: She appears well-developed and well-nourished. No distress.  HENT:  Head: Normocephalic and atraumatic.  Right Ear: External ear normal.  Left Ear: External ear normal.  Nose: Nose normal.  Eyes: Conjunctivae are normal. Right eye exhibits no discharge. Left eye exhibits no discharge.  Eyes not aligned - chronic  Neck: Normal range of motion.  Cardiovascular: Normal rate, regular rhythm and normal heart sounds.   Pulmonary/Chest: Effort normal.  Musculoskeletal: Normal range of motion. She exhibits no edema.  R LE non pitting edema. Thick skin with lichenification. Slight duskiness of R foot but warm. Due to size, no pusle felt but good Doppler R DP pulse - biphasic.  Neurological: She is alert.  Skin: Skin is warm and dry. She is not diaphoretic.  Psychiatric: She has a normal mood and affect. Her behavior is normal. Judgment and thought content normal.  R upper arm lateral bx site well healed. R posterior neck bx site - no erythema. Small (87mm crater). Acanthosis nigracas.        Assessment & Plan:

## 2015-09-06 NOTE — Assessment & Plan Note (Addendum)
Ms Jennifer [name] her depression is the driving factor of her poor status. She left PT on a Friday and did not leave her bedroom until the following Tues. Her two sons are back living with her and this is creating stress. She is current on Zoloft, titrated up to 100 QD but she is not getting any benefit. Previously, on wellbutrin, lexapro, elavil, and cymbalta. The last two I am sure were dual - depression and pain. She is also on trazodone.  Jennifer [name] performed a PHQ9 and she scored a 24.She scored max on all questions except she is not suicidal.   I have previously referred her to pyshc as I feel she would benefit from their services as her depression is severe per PHQ9 and has failed 1st line tx. Jennifer [name] has worked on this on numerous occassions and there are no resources for her.  She has great insight, good eye contact, is interactive, very well groomed (new hair from son's girlfriend).  I will research augmentation therapy.

## 2015-09-06 NOTE — Patient Instructions (Signed)
See me in Nov

## 2015-09-06 NOTE — Assessment & Plan Note (Addendum)
Flu shot today.  UDS   Microalb today as was 300 most recently. On ACE  Intertrigo : has nystatin powder. Using under B breasts. Stil with rash and itching. L has excoriation, R doesn't. Large area of hyperpigmentation but no erythema nor satellite lesions. Likely partially tx candidiasis. Will try cream of steroid and antifungal combined. No RF as can cause skin thinning. To get controlled with the combo cream then return to nystatin.

## 2015-09-06 NOTE — Assessment & Plan Note (Signed)
Going to rehab 2 times weekly. Thinks is making a bit of progress and able to do a bit more.

## 2015-09-06 NOTE — Assessment & Plan Note (Signed)
See above

## 2015-09-06 NOTE — Assessment & Plan Note (Signed)
Good today!! Cont meds - norvasc 5 QD, atenolol 100, Lotensin 40, and lasix.  BP Readings from Last 3 Encounters:  09/06/15 143/64  09/05/15 121/95  08/31/15 167/82

## 2015-09-06 NOTE — Assessment & Plan Note (Signed)
Ongoing at 1.5 PPD. Smokes bc she is depressed and has nothing else to do. Has failed prior interventions.

## 2015-09-06 NOTE — Assessment & Plan Note (Signed)
Did not discuss is depth today. UDs today.

## 2015-09-06 NOTE — Assessment & Plan Note (Addendum)
Since her MRI in June, she has had R hip pain that makes the R leg feel heavy and radiates down R posterior leg to plantar surface of R foot. It is burning in nature. Trigger is lying flat in bed. If she sits up and dangles foot over side of bed, it will feel better in 30-60 min. The oxycodone doesn't touch the pain. She has the pain 6 days out of the week. PT doesn't make the pain worse. She does not lay on her R side.   The MRI did not show any R sided nerve involvement. She has known PAD with stents on R. Last ABI's were in 2014 and just were mildly abnl.   ASSESSMENT : Her pain is likely rest pain from her PVD. She has classic sxs, ongoing tobacco use, and a bit of a duskiness to her R foot. She is not having limb threatening ischemia and has R DP doppler pulse. I will order and ABI but may be falsely high with DM and I am referring back to vascular surgery. RF mgmt remains difficult as she has not yet been able to quit tobacco and DM uncontrolled.  PLAN ABI (normal) Vascular surgery referral Cont to work on [employer]

## 2015-09-06 NOTE — Assessment & Plan Note (Signed)
Seen on most recent eye exam. RF mgmt and optho F/U. No sxs.

## 2015-09-07 ENCOUNTER — Encounter: Payer: Self-pay | Admitting: Physical Therapy

## 2015-09-07 ENCOUNTER — Ambulatory Visit: Payer: Medicare Other | Admitting: Physical Therapy

## 2015-09-07 DIAGNOSIS — M792 Neuralgia and neuritis, unspecified: Secondary | ICD-10-CM | POA: Diagnosis not present

## 2015-09-07 DIAGNOSIS — R531 Weakness: Secondary | ICD-10-CM | POA: Diagnosis not present

## 2015-09-07 DIAGNOSIS — R6889 Other general symptoms and signs: Secondary | ICD-10-CM

## 2015-09-07 DIAGNOSIS — R269 Unspecified abnormalities of gait and mobility: Secondary | ICD-10-CM

## 2015-09-07 DIAGNOSIS — M545 Low back pain: Secondary | ICD-10-CM | POA: Diagnosis not present

## 2015-09-07 DIAGNOSIS — Z89512 Acquired absence of left leg below knee: Secondary | ICD-10-CM | POA: Diagnosis not present

## 2015-09-07 DIAGNOSIS — E1121 Type 2 diabetes mellitus with diabetic nephropathy: Secondary | ICD-10-CM | POA: Insufficient documentation

## 2015-09-07 LAB — MICROALBUMIN / CREATININE URINE RATIO
Creatinine, Urine: 177.7 mg/dL
MICROALB/CREAT RATIO: 672 mg/g creat — ABNORMAL HIGH (ref 0.0–30.0)
Microalbumin, Urine: 1194.1 ug/mL

## 2015-09-07 NOTE — Assessment & Plan Note (Signed)
Microalb came back elevated at 672. Now 2 elevated in 1 yr. Meets dx for nephropathy. On ACEI. DM and HTN control suboptimal. Cont to work on [employer].

## 2015-09-07 NOTE — Therapy (Signed)
[facility] [address] [place], [place], [address] Phone: [phone]   Fax:  [phone]  Physical Therapy Treatment  Patient Details  Name: Jennifer [name] MRN: [id number] Date of Birth: 1961-01-14 Referring Provider:  Bartholomew Crews, MD  Encounter Date: 09/07/2015   09/07/15 1411  PT Visits / Re-Eval  Visit Number 8  Number of Visits 18  Date for PT Re-Evaluation 10/12/15  Authorization  Authorization Type Medicare G-Code 10th visit  PT Time Calculation  PT Start Time 1402  PT Stop Time 1445  PT Time Calculation (min) 43 min  PT - End of Session  Equipment Utilized During Treatment Gait belt  Activity Tolerance Patient tolerated treatment well  Behavior During Therapy [facility] for tasks assessed/performed     Past Medical History  Diagnosis Date  . Depression   . GERD (gastroesophageal reflux disease)   . Hyperlipidemia   . Hypertension   . Glaucoma   . PVD (peripheral vascular disease) with claudication     Stents to bilateral common iliac arteries (left 2005, right 2008), on chronic plavix  . Hyperplastic colon polyp 12/2010    Per colonoscopy (12/2010) - Dr. Deatra Ina  . Asthma   . COPD 01/08/2007    PFT's 05/2007 : FEV1/FVC 82, FEV1 64% pred, FEF 25-75% 40% predicted, 16% improvement in FEV1 with bronchodilators.     . Tobacco abuse   . Chronic osteomyelitis of foot     chronic, right secondary to diabetic foot ulcers  . Polymicrobial bacterial infection 01/2013    GBS and S. aureus bacteremia // Source likely infected diabetic foot ulcer  . 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 Blood cultures 01/26/13 S. Aureus and GBS. Blood cultures Feb 6th, 8th, and 9th and March were negative.Repeat TEE 3/20 negative for vegitations  . 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 Chronic diastolic heart failure     grade 2 per 2D echocardiogram  (01/2013)  . Ulcer of foot, chronic     Left. No OM per MRI (01/2013)  . DVT of upper extremity (deep vein thrombosis) 03/11/2013    Secondary to PICC line. Right brachial vein, diagnosed on 03/10/2013 Coumadin for 3 months. End date 06/10/2013   . Chronic pain syndrome 12/03/2011    Likely secondary to depression, "fibromyalgia", neuropathy, and obesity. Lumbar MRI 2014 no sig change from prior (2008) : Stable hypertrophic facet disease most notable at L4-5. Stable shallow left foraminal/extraforaminal disc protrusion at L4-5. No direct neural compression.      . Environmental allergies     Hx: of  . Fatty liver 2003    observed on ultrasound abdomen  . CHF (congestive heart failure)   . Anginal pain     '3' of 10 ischemia ruled out 9/9   . Fibromyalgia   . Chronic bronchitis     "I get it alot" (09/28/2013)  . Exertional shortness of breath   . Rheumatoid arthritis   . Arthritis of lumbar spine   . Chronic lower back pain   . Diabetic peripheral neuropathy   . Type II diabetes mellitus with peripheral circulatory disorders, uncontrolled DX: 1993    Insulin dep. Poor control. Complicated by diabetic foot ulcer and diabetic eye disease.    . 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 2/2 tobacco use, DM, and cont  weight bearing on surgical wound and developed gangrene     Past Surgical History  Procedure Laterality Date  . Abdominal hysterectomy  1997    secondary to uterine fibroids  . Tubal ligation    . Shoulder arthroscopy w/ rotator cuff repair Bilateral   . Breast biopsy      multiple-benign per pt  . Wrist surgery Right     "for tumors" (09/28/2013)  . Ganglion cyst excision      multiple  . Other surgical history      stents in lower ext  . Tee without cardioversion N/A 01/31/2013    Procedure: TRANSESOPHAGEAL ECHOCARDIOGRAM (TEE);  Surgeon: Fay Records, MD;  Location: [facility];  Service:  Cardiovascular;  Laterality: N/A;  Rm [phone]  . Bladder surgery      bladder reconstruction surgery  . Tee without cardioversion N/A 03/10/2013    Procedure: TRANSESOPHAGEAL ECHOCARDIOGRAM (TEE);  Surgeon: Larey Dresser, MD;  Location: [facility];  Service: Cardiovascular;  Laterality: N/A;  Rm. [phone]  . Skin split graft Bilateral 05/13/2013    Procedure: Right and Left Foot Allograft Skin Graft;  Surgeon: Newt Minion, MD;  Location: [facility];  Service: Orthopedics;  Laterality: Bilateral;  Right and Left Foot Allograft Skin Graft  . Toe amputation Left 08/31/2013    4TH & 5 TH TOE   . Amputation Left 08/31/2013    Procedure: AMPUTATION RAY;  Surgeon: Newt Minion, MD;  Location: [facility];  Service: Orthopedics;  Laterality: Left;  Left Foot 5th Ray Amputation  . Esophagogastroduodenoscopy N/A 09/20/2013    Procedure: ESOPHAGOGASTRODUODENOSCOPY (EGD);  Surgeon: Jerene Bears, MD;  Location: [facility];  Service: Gastroenterology;  Laterality: N/A;  . Foot amputation through metatarsal Left 09/28/2013  . Tonsillectomy    . Amputation Left 09/28/2013    Procedure: Left Midfoot amputation;  Surgeon: Newt Minion, MD;  Location: [facility];  Service: Orthopedics;  Laterality: Left;  Left Midfoot amputation  . Amputation Left 10/14/2013    Procedure: AMPUTATION BELOW KNEE- left;  Surgeon: Newt Minion, MD;  Location: [facility];  Service: Orthopedics;  Laterality: Left;  Left Below Knee Amputation     There were no vitals filed for this visit.  Visit Diagnosis:   Abnormality of gait   .  Activity intolerance   .  Weakness generalized   .  Status post below knee amputation of left lower extremity     Decreased functional activity tolerance        Subjective Assessment - 09/07/15 1406    Subjective Saw Dr [name] yesterday, reports the MD thinks her leg pain is due to circulation issues. Going for more testing with vascular MD. No change in pain. Pt also had a fall: she was standing up from her  wheelchair, she side stepped to reach for something in a cabinet, lost her balance posteriorly and landed against the fridge. She was able to bring herself upright and sit safely in her chair.                                              Currently in Pain? Yes   Pain Score 10-Worst pain ever   Pain Location Hip   Pain Orientation Left;Lower  moves sides daily   Pain Descriptors / Indicators Sore;Aching;Burning;Jabbing   Pain Type Chronic pain   Pain Onset  More than a month ago   Pain Frequency Constant   Aggravating Factors  increased activity, weather changes   Pain Relieving Factors meds, rest            [facility] Adult PT Treatment/Exercise - 09/07/15 1412    Transfers   Sit to Stand 5: Supervision;With upper extremity assist;With armrests;From chair/3-in-1   Sit to Stand Details Verbal cues for safe use of DME/AE   Stand to Sit 5: Supervision;With upper extremity assist;With armrests;To chair/3-in-1   Stand to Sit Details (indicate cue type and reason) Verbal cues for safe use of DME/AE;Verbal cues for precautions/safety   Ambulation/Gait   Ambulation/Gait Yes   Ambulation/Gait Assistance 5: Supervision   Ambulation Distance (Feet) 125 Feet  x 3 reps   Assistive device Prosthesis;Rollator   Gait Pattern Step-to pattern;Decreased step length - right;Decreased stance time - left;Decreased stride length;Decreased hip/knee flexion - right;Decreased weight shift to right;Right hip hike;Trunk flexed;Abducted- right;Poor foot clearance - right   Ambulation Surface Level;Indoor     Exercises: Side stepping at counter x 3 laps in a row before needing rest break Alternating hip abduction x 15 each side with cues on posture and ex form Mini squats x 10 reps          PT Short Term Goals - 08/31/15 1459    PT SHORT TERM GOAL #1   Title Patient demonstrates understanding of initail HEP with minimal cues. (Target Date: 09/12/2015)   Time 1   Period Months   Status On-going   PT  SHORT TERM GOAL #2   Title patient verbalizes understanding of fall prevention strategies in home. (Target Date: 09/12/2015)   Time 1   Period Months   Status On-going   PT SHORT TERM GOAL #3   Title patient verbalizes understanding proper activity modification to assist with pain management. (Target Date: 09/12/2015)   Time 1   Period Months   Status On-going           PT Long Term Goals - 08/31/15 1500    PT LONG TERM GOAL #1   Title Patient verbalizes & demonstrates understanding of ongoing HEP / fitness plan. (Target Date: 10/12/2015)   Time 2   Period Months   Status On-going   PT LONG TERM GOAL #2   Title Patient demonstrates / verbalizes managing activity level to manage pain. (Target Date: 10/12/2015)   Time 2   Period Months   Status On-going   PT LONG TERM GOAL #3   Title Patient ambulates 250' with rolling walker or rollator walker & prosthesis modified independent.  (Target Date: 10/12/2015)   Time 2   Period Months   Status On-going   PT LONG TERM GOAL #4   Title Timed Up & Go with rolling walker & prosthesis <20 seconds safely to decrease risk of falls.  (Target Date: 10/12/2015)   Time 2   Period Months   Status On-going   PT LONG TERM GOAL #5   Title Berg Balance >20 / 56  (Target Date: 10/12/2015)   Time 2   Period Months   Status On-going   PT LONG TERM GOAL #6   Title Patient negotiates ramps & curbs with rolling walker & stairs with 1 rail with prosthesis with supervision.  (Target Date: 10/12/2015)   Time 2   Period Months   Status On-going   PT LONG TERM GOAL #7   Title Patient self reports via FOTO improvement in functional status by > 5 points.  (  Target Date: 10/12/2015)   Time 2   Period Months   Status On-going        09/07/15 1412  Plan  Clinical Impression Statement Pt making progress with increased overall gait distance and less rest breaks needed today. Progressing toward goals.  Pt will benefit from skilled therapeutic  intervention in order to improve on the following deficits Abnormal gait;Decreased activity tolerance;Decreased balance;Decreased endurance;Decreased knowledge of use of DME;Decreased mobility;Decreased range of motion;Decreased strength;Impaired flexibility;Prosthetic Dependency;Postural dysfunction;Pain  Rehab Potential Good  Clinical Impairments Affecting Rehab Potential chronic medical conditions with non-compliance   PT Frequency 2x / week  PT Duration Other (comment) (9 weeks)  PT Treatment/Interventions ADLs/Self Care Home Management;Moist Heat;DME Instruction;Gait training;Stair training;Functional mobility training;Therapeutic activities;Therapeutic exercise;Balance training;Neuromuscular re-education;Patient/family education;Prosthetic Training;Manual techniques;Passive range of motion  PT Next Visit Plan Gait with rollator for community based activities;continue with strengthening and activity tolerance activities.  Consulted and Agree with Plan of Care Patient    Problem List Patient Active Problem List   Diagnosis Date Noted  . 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 Uncontrolled diabetes mellitus with retinopathy, due to underlying condition, without macular edema 09/05/2015  . Diabetic retinopathy 09/05/2015  . Skin tag 08/08/2015  . Counseling regarding end of life decision making 06/14/2015  . Atherosclerosis of aorta 04/04/2015  . Anemia 10/05/2014  . Chronic diastolic heart failure   . S/P BKA (below knee amputation) unilateral   . Tobacco abuse   . Severe obesity (BMI >= 40) 03/02/2013  . Abnormality of gait 03/01/2013  . Infective endocarditis 01/22/2013  . Healthcare maintenance 07/10/2012  . Opioid dependence 12/03/2011  . PVD in DM 08/27/2011  . GLAUCOMA 11/29/2009  . Essential hypertension 11/29/2009  . Chronic insomnia 10/25/2009  . GASTROESOPHAGEAL REFLUX DISEASE 11/24/2008  . Depression, major, severe recurrence 04/06/2008  . DM (diabetes  mellitus) type II uncontrolled, periph vascular disorder 04/02/2007  . Hyperlipemia 01/08/2007  . COPD (chronic obstructive pulmonary disease) 01/08/2007    Willow Ora 09/07/2015, 2:39 PM  Willow Ora, PTA, [facility] [address] [place], [place] [address] [phone] 09/07/2015, 2:40 PM

## 2015-09-10 ENCOUNTER — Ambulatory Visit: Payer: Medicare Other | Admitting: Physical Therapy

## 2015-09-10 LAB — GLUCOSE, CAPILLARY: Glucose-Capillary: 150 mg/dL — ABNORMAL HIGH (ref 65–99)

## 2015-09-12 ENCOUNTER — Telehealth: Payer: Self-pay | Admitting: *Deleted

## 2015-09-12 ENCOUNTER — Encounter: Payer: Self-pay | Admitting: Physical Therapy

## 2015-09-12 ENCOUNTER — Ambulatory Visit: Payer: Medicare Other | Admitting: Physical Therapy

## 2015-09-12 DIAGNOSIS — M792 Neuralgia and neuritis, unspecified: Secondary | ICD-10-CM | POA: Diagnosis not present

## 2015-09-12 DIAGNOSIS — Z89512 Acquired absence of left leg below knee: Secondary | ICD-10-CM | POA: Diagnosis not present

## 2015-09-12 DIAGNOSIS — R531 Weakness: Secondary | ICD-10-CM | POA: Diagnosis not present

## 2015-09-12 DIAGNOSIS — R269 Unspecified abnormalities of gait and mobility: Secondary | ICD-10-CM | POA: Diagnosis not present

## 2015-09-12 DIAGNOSIS — R6889 Other general symptoms and signs: Secondary | ICD-10-CM | POA: Diagnosis not present

## 2015-09-12 DIAGNOSIS — M545 Low back pain: Secondary | ICD-10-CM | POA: Diagnosis not present

## 2015-09-12 NOTE — Therapy (Signed)
[facility] [address] [place], [place], [address] Phone: [phone]   Fax:  [phone]  Physical Therapy Treatment  Patient Details  Name: Jennifer [name] MRN: [id number] Date of Birth: October 01, 1961 Referring Provider:  Bartholomew Crews, MD  Encounter Date: 09/12/2015   09/12/15 1241  PT Visits / Re-Eval  Visit Number 9  Number of Visits 18  Date for PT Re-Evaluation 10/12/15  Authorization  Authorization Type Medicare G-Code 10th visit  PT Time Calculation  PT Start Time 1234  PT Stop Time 1315  PT Time Calculation (min) 41 min  PT - End of Session  Equipment Utilized During Treatment Gait belt  Activity Tolerance Patient tolerated treatment well  Behavior During Therapy [facility] for tasks assessed/performed     Past Medical History  Diagnosis Date  . Depression   . GERD (gastroesophageal reflux disease)   . Hyperlipidemia   . Hypertension   . Glaucoma   . PVD (peripheral vascular disease) with claudication     Stents to bilateral common iliac arteries (left 2005, right 2008), on chronic plavix  . Hyperplastic colon polyp 12/2010    Per colonoscopy (12/2010) - Dr. Deatra Ina  . Asthma   . COPD 01/08/2007    PFT's 05/2007 : FEV1/FVC 82, FEV1 64% pred, FEF 25-75% 40% predicted, 16% improvement in FEV1 with bronchodilators.     . Tobacco abuse   . Chronic osteomyelitis of foot     chronic, right secondary to diabetic foot ulcers  . Polymicrobial bacterial infection 01/2013    GBS and S. aureus bacteremia // Source likely infected diabetic foot ulcer  . 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 Blood cultures 01/26/13 S. Aureus and GBS. Blood cultures Feb 6th, 8th, and 9th and March were negative.Repeat TEE 3/20 negative for vegitations  . 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 Chronic diastolic heart failure     grade 2 per 2D echocardiogram  (01/2013)  . Ulcer of foot, chronic     Left. No OM per MRI (01/2013)  . DVT of upper extremity (deep vein thrombosis) 03/11/2013    Secondary to PICC line. Right brachial vein, diagnosed on 03/10/2013 Coumadin for 3 months. End date 06/10/2013   . Chronic pain syndrome 12/03/2011    Likely secondary to depression, "fibromyalgia", neuropathy, and obesity. Lumbar MRI 2014 no sig change from prior (2008) : Stable hypertrophic facet disease most notable at L4-5. Stable shallow left foraminal/extraforaminal disc protrusion at L4-5. No direct neural compression.      . Environmental allergies     Hx: of  . Fatty liver 2003    observed on ultrasound abdomen  . CHF (congestive heart failure)   . Anginal pain     '3' of 10 ischemia ruled out 9/9   . Fibromyalgia   . Chronic bronchitis     "I get it alot" (09/28/2013)  . Exertional shortness of breath   . Rheumatoid arthritis   . Arthritis of lumbar spine   . Chronic lower back pain   . Diabetic peripheral neuropathy   . Type II diabetes mellitus with peripheral circulatory disorders, uncontrolled DX: 1993    Insulin dep. Poor control. Complicated by diabetic foot ulcer and diabetic eye disease.    . 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 2/2 tobacco use, DM, and cont  weight bearing on surgical wound and developed gangrene     Past Surgical History  Procedure Laterality Date  . Abdominal hysterectomy  1997    secondary to uterine fibroids  . Tubal ligation    . Shoulder arthroscopy w/ rotator cuff repair Bilateral   . Breast biopsy      multiple-benign per pt  . Wrist surgery Right     "for tumors" (09/28/2013)  . Ganglion cyst excision      multiple  . Other surgical history      stents in lower ext  . Tee without cardioversion N/A 01/31/2013    Procedure: TRANSESOPHAGEAL ECHOCARDIOGRAM (TEE);  Surgeon: Fay Records, MD;  Location: [facility];  Service:  Cardiovascular;  Laterality: N/A;  Rm [phone]  . Bladder surgery      bladder reconstruction surgery  . Tee without cardioversion N/A 03/10/2013    Procedure: TRANSESOPHAGEAL ECHOCARDIOGRAM (TEE);  Surgeon: Larey Dresser, MD;  Location: [facility];  Service: Cardiovascular;  Laterality: N/A;  Rm. [phone]  . Skin split graft Bilateral 05/13/2013    Procedure: Right and Left Foot Allograft Skin Graft;  Surgeon: Newt Minion, MD;  Location: [facility];  Service: Orthopedics;  Laterality: Bilateral;  Right and Left Foot Allograft Skin Graft  . Toe amputation Left 08/31/2013    4TH & 5 TH TOE   . Amputation Left 08/31/2013    Procedure: AMPUTATION RAY;  Surgeon: Newt Minion, MD;  Location: [facility];  Service: Orthopedics;  Laterality: Left;  Left Foot 5th Ray Amputation  . Esophagogastroduodenoscopy N/A 09/20/2013    Procedure: ESOPHAGOGASTRODUODENOSCOPY (EGD);  Surgeon: Jerene Bears, MD;  Location: [facility];  Service: Gastroenterology;  Laterality: N/A;  . Foot amputation through metatarsal Left 09/28/2013  . Tonsillectomy    . Amputation Left 09/28/2013    Procedure: Left Midfoot amputation;  Surgeon: Newt Minion, MD;  Location: [facility];  Service: Orthopedics;  Laterality: Left;  Left Midfoot amputation  . Amputation Left 10/14/2013    Procedure: AMPUTATION BELOW KNEE- left;  Surgeon: Newt Minion, MD;  Location: [facility];  Service: Orthopedics;  Laterality: Left;  Left Below Knee Amputation     There were no vitals filed for this visit.  Visit Diagnosis:   Abnormality of gait    Activity intolerance   .  Weakness generalized   .  Status post below knee amputation of left lower extremity   .  Decreased functional activity tolerance    Midline low back pain, with sciatica presence unspecified        Subjective Assessment - 09/12/15 1237    Subjective No new complaints. Does report that her pain is worse today due to weather affecting her fibromyalgia, etc. Going 09/28/15 for ABI to check  circulation of right leg. Then she see's Dr. Ruben Reason after (10/04/15).   Currently in Pain? Yes   Pain Score 10-Worst pain ever   Pain Location Other (Comment)  global/all over   Pain Orientation Other (Comment)  global   Pain Descriptors / Indicators Aching;Shooting;Sore;Discomfort   Pain Type Chronic pain   Pain Onset More than a month ago   Pain Frequency Constant   Aggravating Factors  increased activity, weather changes   Pain Relieving Factors meds, rest   Effect of Pain on Daily Activities limits activity           [facility] Adult PT Treatment/Exercise - 09/12/15 1241    Transfers   Sit to Stand 5: Supervision;With upper  extremity assist;With armrests;From chair/3-in-1   Stand to Sit 5: Supervision;With upper extremity assist;With armrests;To chair/3-in-1   Ambulation/Gait   Ambulation/Gait Yes   Ambulation/Gait Assistance 5: Supervision   Ambulation/Gait Assistance Details cues on posture and for equal step length   Ambulation Distance (Feet) 120 Feet  x 2  laps; 146 ft x 1   Assistive device Prosthesis;Rollator   Gait Pattern Step-to pattern;Decreased step length - right;Decreased stance time - left;Decreased stride length;Decreased hip/knee flexion - right;Decreased weight shift to right;Right hip hike;Trunk flexed;Abducted- right;Poor foot clearance - right   Ambulation Surface Level;Indoor   Prosthetics   Current prosthetic wear tolerance (days/week)  reports daily wear   Current prosthetic wear tolerance (#hours/day)  reports most of awake hours   Residual limb condition  intact except for callous.     Exercise Scifit x 4 extremities level 2.5 x 10 minutes with goal rpm >/= 45 for strengthening and activity tolerance.   Self Care: Pt able to state most fall prevention strategies issued from previous session. Reinforced all of them with pt. Pt also able to state how to modify her activity to assist with pain management.         PT Short Term Goals -  09/12/15 1302    PT SHORT TERM GOAL #1   Title Patient demonstrates understanding of initail HEP with minimal cues. (Target Date: 09/12/2015)   Baseline 09/12/15: pt independent with OTAGO program   Status Achieved   PT SHORT TERM GOAL #2   Title patient verbalizes understanding of fall prevention strategies in home. (Target Date: 09/12/2015)   Baseline 09/12/15: pt given strategies on 08/28/15, able to recount most of them today   Status Partially met   PT SHORT TERM GOAL #3   Title patient verbalizes understanding proper activity modification to assist with pain management. (Target Date: 09/12/2015)   Baseline 09/12/15: pt verbalizes she knows she needs to exercise, not all at once, daily for best results, however not actually doing this on daily basis   Status Achieved           PT Long Term Goals - 08/31/15 1500    PT LONG TERM GOAL #1   Title Patient verbalizes & demonstrates understanding of ongoing HEP / fitness plan. (Target Date: 10/12/2015)   Time 2   Period Months   Status On-going   PT LONG TERM GOAL #2   Title Patient demonstrates / verbalizes managing activity level to manage pain. (Target Date: 10/12/2015)   Time 2   Period Months   Status On-going   PT LONG TERM GOAL #3   Title Patient ambulates 250' with rolling walker or rollator walker & prosthesis modified independent.  (Target Date: 10/12/2015)   Time 2   Period Months   Status On-going   PT LONG TERM GOAL #4   Title Timed Up & Go with rolling walker & prosthesis <20 seconds safely to decrease risk of falls.  (Target Date: 10/12/2015)   Time 2   Period Months   Status On-going   PT LONG TERM GOAL #5   Title Berg Balance >20 / 56  (Target Date: 10/12/2015)   Time 2   Period Months   Status On-going   PT LONG TERM GOAL #6   Title Patient negotiates ramps & curbs with rolling walker & stairs with 1 rail with prosthesis with supervision.  (Target Date: 10/12/2015)   Time 2   Period Months   Status On-going    PT LONG TERM  GOAL #7   Title Patient self reports via FOTO improvement in functional status by > 5 points.  (Target Date: 10/12/2015)   Time 2   Period Months   Status On-going        09/12/15 1241  Plan  Clinical Impression Statement Pt met 2/3 STG's with 3rd one partially met. Pt also with increased gait distance/activity tolerance today. Progressing toward LTG's.  Pt will benefit from skilled therapeutic intervention in order to improve on the following deficits Abnormal gait;Decreased activity tolerance;Decreased balance;Decreased endurance;Decreased knowledge of use of DME;Decreased mobility;Decreased range of motion;Decreased strength;Impaired flexibility;Prosthetic Dependency;Postural dysfunction;Pain  Rehab Potential Good  Clinical Impairments Affecting Rehab Potential chronic medical conditions with non-compliance   PT Frequency 2x / week  PT Duration Other (comment) (9 weeks)  PT Treatment/Interventions ADLs/Self Care Home Management;Moist Heat;DME Instruction;Gait training;Stair training;Functional mobility training;Therapeutic activities;Therapeutic exercise;Balance training;Neuromuscular re-education;Patient/family education;Prosthetic Training;Manual techniques;Passive range of motion  PT Next Visit Plan Gait with rollator for community based activities;continue with strengthening and activity tolerance activities. G-code next visit.  Consulted and Agree with Plan of Care Patient     Problem List Patient Active Problem List   Diagnosis Date Noted  . 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 Uncontrolled diabetes mellitus with retinopathy, due to underlying condition, without macular edema 09/05/2015  . Diabetic retinopathy 09/05/2015  . Skin tag 08/08/2015  . Counseling regarding end of life decision making 06/14/2015  . Atherosclerosis of aorta 04/04/2015  . Anemia 10/05/2014  . Chronic diastolic heart failure   . S/P BKA (below knee amputation) unilateral   .  Tobacco abuse   . Severe obesity (BMI >= 40) 03/02/2013  . Abnormality of gait 03/01/2013  . Infective endocarditis 01/22/2013  . Healthcare maintenance 07/10/2012  . Opioid dependence 12/03/2011  . PVD in DM 08/27/2011  . GLAUCOMA 11/29/2009  . Essential hypertension 11/29/2009  . Chronic insomnia 10/25/2009  . GASTROESOPHAGEAL REFLUX DISEASE 11/24/2008  . Depression, major, severe recurrence 04/06/2008  . DM (diabetes mellitus) type II uncontrolled, periph vascular disorder 04/02/2007  . Hyperlipemia 01/08/2007  . COPD (chronic obstructive pulmonary disease) 01/08/2007    Willow Ora 09/12/2015, 1:07 PM  Willow Ora, PTA, [facility] [address], [place], [place] [address] [phone] 09/12/2015, 1:07 PM

## 2015-09-13 ENCOUNTER — Telehealth: Payer: Self-pay | Admitting: *Deleted

## 2015-09-13 MED ORDER — NEOSPORIN ORIGINAL 3.5-400-5000 EX OINT: TOPICAL_OINTMENT | CUTANEOUS | Status: DC

## 2015-09-13 NOTE — Telephone Encounter (Signed)
Polysporin is very expensive may PPA change to neosporin, if so please send a new script electronically

## 2015-09-13 NOTE — Telephone Encounter (Signed)
Done. thanks

## 2015-09-14 ENCOUNTER — Other Ambulatory Visit: Payer: Self-pay | Admitting: Internal Medicine

## 2015-09-14 LAB — PRESCRIPTION ABUSE MONITORING 17P, URINE
6-Acetylmorphine, Urine: NEGATIVE ng/mL
Amphetamine Scrn, Ur: NEGATIVE ng/mL
BARBITURATE SCREEN URINE: NEGATIVE ng/mL
BENZODIAZEPINE SCREEN, URINE: NEGATIVE ng/mL
Buprenorphine, Urine: NEGATIVE ng/mL
CANNABINOIDS UR QL SCN: NEGATIVE ng/mL
Carisoprodol/Meprobamate, Ur: NEGATIVE ng/mL
Cocaine (Metab) Scrn, Ur: NEGATIVE ng/mL
Creatinine(Crt), U: 187 mg/dL (ref 20.0–300.0)
EDDP, Urine: NEGATIVE ng/mL
Fentanyl, Urine: NEGATIVE pg/mL
MDMA Screen, Urine: NEGATIVE ng/mL
Meperidine Screen, Urine: NEGATIVE ng/mL
Methadone Screen, Urine: NEGATIVE ng/mL
Nitrite Urine, Quantitative: NEGATIVE ug/mL
Ph of Urine: 5.5 (ref 4.5–8.9)
Phencyclidine Qn, Ur: NEGATIVE ng/mL
Propoxyphene Scrn, Ur: NEGATIVE ng/mL
SPECIFIC GRAVITY: 1.014
Tapentadol, Urine: NEGATIVE ng/mL
Tramadol Screen, Urine: NEGATIVE ng/mL

## 2015-09-14 LAB — OXYCODONE/OXYMORPHONE CONFIRM
OXYCODONE/OXYMORPH: POSITIVE — AB
OXYCODONE: >3000 ng/mL
OXYCODONE: POSITIVE — AB
OXYMORPHONE CONFIRM: >3000 ng/mL
OXYMORPHONE: POSITIVE — AB

## 2015-09-14 LAB — OPIATES CONFIRMATION, URINE: Opiates: NEGATIVE ng/mL

## 2015-09-17 ENCOUNTER — Ambulatory Visit: Payer: Medicare Other | Admitting: Physical Therapy

## 2015-09-17 ENCOUNTER — Encounter: Payer: Self-pay | Admitting: Physical Therapy

## 2015-09-17 DIAGNOSIS — Z89512 Acquired absence of left leg below knee: Secondary | ICD-10-CM

## 2015-09-17 DIAGNOSIS — R6889 Other general symptoms and signs: Secondary | ICD-10-CM | POA: Diagnosis not present

## 2015-09-17 DIAGNOSIS — R531 Weakness: Secondary | ICD-10-CM

## 2015-09-17 DIAGNOSIS — M545 Low back pain: Secondary | ICD-10-CM | POA: Diagnosis not present

## 2015-09-17 DIAGNOSIS — R269 Unspecified abnormalities of gait and mobility: Secondary | ICD-10-CM | POA: Diagnosis not present

## 2015-09-17 DIAGNOSIS — M792 Neuralgia and neuritis, unspecified: Secondary | ICD-10-CM | POA: Diagnosis not present

## 2015-09-17 NOTE — Therapy (Signed)
[facility] [address] [place], [place], [address] Phone: [phone]   Fax:  [phone]  Physical Therapy Treatment  Patient Details  Name: Jennifer [name] MRN: [id number] Date of Birth: [birth year] Referring Provider:  Bartholomew Crews, MD  Encounter Date: 09/17/2015      PT End of Session - 09/17/15 1238    Visit Number 10   Number of Visits 18   Date for PT Re-Evaluation 10/12/15   Authorization Type Medicare G-Code 10th visit   PT Start Time 1233   PT Stop Time 1315   PT Time Calculation (min) 42 min   Equipment Utilized During Treatment Gait belt   Activity Tolerance Patient tolerated treatment well   Behavior During Therapy [facility] for tasks assessed/performed      Past Medical History  Diagnosis Date  . Depression   . GERD (gastroesophageal reflux disease)   . Hyperlipidemia   . Hypertension   . Glaucoma   . PVD (peripheral vascular disease) with claudication     Stents to bilateral common iliac arteries (left 2005, right 2008), on chronic plavix  . Hyperplastic colon polyp 12/2010    Per colonoscopy (12/2010) - Dr. Deatra Ina  . Asthma   . COPD 01/08/2007    PFT's 05/2007 : FEV1/FVC 82, FEV1 64% pred, FEF 25-75% 40% predicted, 16% improvement in FEV1 with bronchodilators.     . Tobacco abuse   . Chronic osteomyelitis of foot     chronic, right secondary to diabetic foot ulcers  . Polymicrobial bacterial infection 01/2013    GBS and S. aureus bacteremia // Source likely infected diabetic foot ulcer  . 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 Blood cultures 01/26/13 S. Aureus and GBS. Blood cultures Feb 6th, 8th, and 9th and March were negative.Repeat TEE 3/20 negative for vegitations  . 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 Chronic diastolic heart failure     grade 2 per 2D echocardiogram (01/2013)  . Ulcer of foot, chronic    Left. No OM per MRI (01/2013)  . DVT of upper extremity (deep vein thrombosis) 03/11/2013    Secondary to PICC line. Right brachial vein, diagnosed on 03/10/2013 Coumadin for 3 months. End date 06/10/2013   . Chronic pain syndrome 12/03/2011    Likely secondary to depression, "fibromyalgia", neuropathy, and obesity. Lumbar MRI 2014 no sig change from prior (2008) : Stable hypertrophic facet disease most notable at L4-5. Stable shallow left foraminal/extraforaminal disc protrusion at L4-5. No direct neural compression.      . Environmental allergies     Hx: of  . Fatty liver 2003    observed on ultrasound abdomen  . CHF (congestive heart failure)   . Anginal pain     '3' of 10 ischemia ruled out 9/9   . Fibromyalgia   . Chronic bronchitis     "I get it alot" (09/28/2013)  . Exertional shortness of breath   . Rheumatoid arthritis   . Arthritis of lumbar spine   . Chronic lower back pain   . Diabetic peripheral neuropathy   . Type II diabetes mellitus with peripheral circulatory disorders, uncontrolled DX: 1993    Insulin dep. Poor control. Complicated by diabetic foot ulcer and diabetic eye disease.    . 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 2/2 tobacco use, DM, and  cont weight bearing on surgical wound and developed gangrene     Past Surgical History  Procedure Laterality Date  . Abdominal hysterectomy  1997    secondary to uterine fibroids  . Tubal ligation    . Shoulder arthroscopy w/ rotator cuff repair Bilateral   . Breast biopsy      multiple-benign per pt  . Wrist surgery Right     "for tumors" (09/28/2013)  . Ganglion cyst excision      multiple  . Other surgical history      stents in lower ext  . Tee without cardioversion N/A 01/31/2013    Procedure: TRANSESOPHAGEAL ECHOCARDIOGRAM (TEE);  Surgeon: Fay Records, MD;  Location: [facility];  Service: Cardiovascular;  Laterality: N/A;  Rm [phone]  .  Bladder surgery      bladder reconstruction surgery  . Tee without cardioversion N/A 03/10/2013    Procedure: TRANSESOPHAGEAL ECHOCARDIOGRAM (TEE);  Surgeon: Larey Dresser, MD;  Location: [facility];  Service: Cardiovascular;  Laterality: N/A;  Rm. [phone]  . Skin split graft Bilateral 05/13/2013    Procedure: Right and Left Foot Allograft Skin Graft;  Surgeon: Newt Minion, MD;  Location: [facility];  Service: Orthopedics;  Laterality: Bilateral;  Right and Left Foot Allograft Skin Graft  . Toe amputation Left 08/31/2013    4TH & 5 TH TOE   . Amputation Left 08/31/2013    Procedure: AMPUTATION RAY;  Surgeon: Newt Minion, MD;  Location: [place];  Service: Orthopedics;  Laterality: Left;  Left Foot 5th Ray Amputation  . Esophagogastroduodenoscopy N/A 09/20/2013    Procedure: ESOPHAGOGASTRODUODENOSCOPY (EGD);  Surgeon: Jerene Bears, MD;  Location: [facility];  Service: Gastroenterology;  Laterality: N/A;  . Foot amputation through metatarsal Left 09/28/2013  . Tonsillectomy    . Amputation Left 09/28/2013    Procedure: Left Midfoot amputation;  Surgeon: Newt Minion, MD;  Location: [facility];  Service: Orthopedics;  Laterality: Left;  Left Midfoot amputation  . Amputation Left 10/14/2013    Procedure: AMPUTATION BELOW KNEE- left;  Surgeon: Newt Minion, MD;  Location: [facility];  Service: Orthopedics;  Laterality: Left;  Left Below Knee Amputation     There were no vitals filed for this visit.  Visit Diagnosis:  Abnormality of gait  Activity intolerance  Weakness generalized  Status post below knee amputation of left lower extremity  Decreased functional activity tolerance      Subjective Assessment - 09/17/15 1235    Subjective No new compaints. Was tired after last session.   Pain Score 9    Pain Location Other (Comment)  global/ all over   Pain Orientation Other (Comment)  all over   Pain Descriptors / Indicators Aching;Shooting;Discomfort;Sore   Pain Type Chronic pain   Pain Onset  More than a month ago   Pain Frequency Constant   Aggravating Factors  increased activity, weather changes   Pain Relieving Factors meds, rest           [facility] Adult PT Treatment/Exercise - 09/17/15 1239    Transfers   Sit to Stand 5: Supervision;With upper extremity assist;With armrests;From chair/3-in-1   Sit to Stand Details Verbal cues for safe use of DME/AE   Stand to Sit 5: Supervision;With upper extremity assist;With armrests;To chair/3-in-1   Stand to Sit Details (indicate cue type and reason) Verbal cues for safe use of DME/AE;Verbal cues for precautions/safety   Ambulation/Gait   Ambulation/Gait Yes   Ambulation/Gait Assistance 5: Supervision   Ambulation/Gait Assistance  Details cues on posture, walker position with gait and to correct gait deviations   Ambulation Distance (Feet) 155 Feet  x 3 reps, 45 feet, 20 feet x 2   Assistive device Prosthesis;Rollator   Gait Pattern Step-to pattern;Decreased step length - right;Decreased stance time - left;Decreased stride length;Decreased hip/knee flexion - right;Decreased weight shift to right;Right hip hike;Trunk flexed;Abducted- right;Poor foot clearance - right   Ambulation Surface Level;Indoor   Timed Up and Go Test   TUG Normal TUG   Normal TUG (seconds) 27.44   Knee/Hip Exercises: Aerobic   Other Aerobic Scifit x 4 extremities level 2.5 x 10 minutes with goal rpm >/= 50 for strengthening and activity tolerance           PT Short Term Goals - 09/12/15 1302    PT SHORT TERM GOAL #1   Title Patient demonstrates understanding of initail HEP with minimal cues. (Target Date: 09/12/2015)   Baseline 09/12/15: pt independent with OTAGO program   Status Achieved   PT SHORT TERM GOAL #2   Title patient verbalizes understanding of fall prevention strategies in home. (Target Date: 09/12/2015)   Baseline 09/12/15: pt given strategies on 08/28/15, able to recount most of them today   Status Partially Met   PT SHORT TERM GOAL #3   Title  patient verbalizes understanding proper activity modification to assist with pain management. (Target Date: 09/12/2015)   Baseline 09/12/15: pt verbalizes she knows she needs to exercise, not all at once, daily for best results, however not actually doing this on daily basis   Status Achieved           PT Long Term Goals - 08/31/15 1500    PT LONG TERM GOAL #1   Title Patient verbalizes & demonstrates understanding of ongoing HEP / fitness plan. (Target Date: 10/12/2015)   Time 2   Period Months   Status On-going   PT LONG TERM GOAL #2   Title Patient demonstrates / verbalizes managing activity level to manage pain. (Target Date: 10/12/2015)   Time 2   Period Months   Status On-going   PT LONG TERM GOAL #3   Title Patient ambulates 250' with rolling walker or rollator walker & prosthesis modified independent.  (Target Date: 10/12/2015)   Time 2   Period Months   Status On-going   PT LONG TERM GOAL #4   Title Timed Up & Go with rolling walker & prosthesis <20 seconds safely to decrease risk of falls.  (Target Date: 10/12/2015)   Time 2   Period Months   Status On-going   PT LONG TERM GOAL #5   Title Berg Balance >20 / 56  (Target Date: 10/12/2015)   Time 2   Period Months   Status On-going   PT LONG TERM GOAL #6   Title Patient negotiates ramps & curbs with rolling walker & stairs with 1 rail with prosthesis with supervision.  (Target Date: 10/12/2015)   Time 2   Period Months   Status On-going   PT LONG TERM GOAL #7   Title Patient self reports via FOTO improvement in functional status by > 5 points.  (Target Date: 10/12/2015)   Time 2   Period Months   Status On-going           Plan - 09/17/15 1238    Clinical Impression Statement Pt with increased gait distance today and increased overall activity in session. Decreased timed up and go with rollator compared to evaluation. Pt making steady  progress toward goals.   Pt will benefit from skilled therapeutic  intervention in order to improve on the following deficits Abnormal gait;Decreased activity tolerance;Decreased balance;Decreased endurance;Decreased knowledge of use of DME;Decreased mobility;Decreased range of motion;Decreased strength;Impaired flexibility;Prosthetic Dependency;Postural dysfunction;Pain   Rehab Potential Good   Clinical Impairments Affecting Rehab Potential chronic medical conditions with non-compliance    PT Frequency 2x / week   PT Duration Other (comment)  9 weeks   PT Treatment/Interventions ADLs/Self Care Home Management;Moist Heat;DME Instruction;Gait training;Stair training;Functional mobility training;Therapeutic activities;Therapeutic exercise;Balance training;Neuromuscular re-education;Patient/family education;Prosthetic Training;Manual techniques;Passive range of motion   PT Next Visit Plan Gait with rollator for community based activities;continue with strengthening and activity tolerance activities.   Consulted and Agree with Plan of Care Patient        Problem List Patient Active Problem List   Diagnosis Date Noted  . 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 Uncontrolled diabetes mellitus with retinopathy, due to underlying condition, without macular edema 09/05/2015  . Diabetic retinopathy 09/05/2015  . Skin tag 08/08/2015  . Counseling regarding end of life decision making 06/14/2015  . Atherosclerosis of aorta 04/04/2015  . Anemia 10/05/2014  . Chronic diastolic heart failure   . S/P BKA (below knee amputation) unilateral   . Tobacco abuse   . Severe obesity (BMI >= 40) 03/02/2013  . Abnormality of gait 03/01/2013  . Infective endocarditis 01/22/2013  . Healthcare maintenance 07/10/2012  . Opioid dependence 12/03/2011  . PVD in DM 08/27/2011  . GLAUCOMA 11/29/2009  . Essential hypertension 11/29/2009  . Chronic insomnia 10/25/2009  . GASTROESOPHAGEAL REFLUX DISEASE 11/24/2008  . Depression, major, severe recurrence 04/06/2008  . DM  (diabetes mellitus) type II uncontrolled, periph vascular disorder 04/02/2007  . Hyperlipemia 01/08/2007  . COPD (chronic obstructive pulmonary disease) 01/08/2007    Willow Ora 09/17/2015, 4:50 PM  Willow Ora, PTA, [facility] [address] [place], [place] [address] [phone] 09/17/2015, 4:50 PM      Physical Therapy Progress Note  PT added G-Code per protocol. Category mobility: current CK and goal CL  Dates of Reporting Period: 08/13/2015 to 09/17/2015  Objective Reports of Subjective Statement: patient reports walking more with prosthesis.  Objective Measurements: See above  Goal Update: See above  Plan: See above  Reason Skilled Services are Required: Patient needs additional skilled PT to progress mobility at community level with prosthesis & rollator walker.    Jamey Reas, PT, DPT PT Specializing in [place] 09/17/2015 5:34 PM Phone:  [phone]  Fax:  [phone] [facility] [address] [place],  [phone]

## 2015-09-18 ENCOUNTER — Ambulatory Visit (INDEPENDENT_AMBULATORY_CARE_PROVIDER_SITE_OTHER): Payer: Medicare Other | Admitting: Dietician

## 2015-09-18 DIAGNOSIS — F332 Major depressive disorder, recurrent severe without psychotic features: Secondary | ICD-10-CM | POA: Diagnosis not present

## 2015-09-18 DIAGNOSIS — Z7189 Other specified counseling: Secondary | ICD-10-CM

## 2015-09-18 NOTE — Patient Instructions (Signed)
See you next week :)

## 2015-09-18 NOTE — Progress Notes (Signed)
Health Coaching Progress note:  Start time:1340 End time: 58  Jennies is here today for health coaching for her depression. Using the bubble tool, she was able to cite several areas of her life that may be contributing to her depression.   Patient Identified Concern:  Depression, amputation,  Stage of Change Patient Is In:  contemplating Patient Reported Barriers:  Feelings of responsibility, depression, loss, fear, worthlessness Patient Reported Perceived Benefits:  Few at this time Patient Reports Self-Efficacy:   poor Behavior Change Supports:  Her father and friends Goals:  None at this time Patient Education:  About health coaching Follow up : 1 week

## 2015-09-19 ENCOUNTER — Encounter: Payer: Self-pay | Admitting: Physical Therapy

## 2015-09-19 ENCOUNTER — Ambulatory Visit: Payer: Medicare Other | Admitting: Physical Therapy

## 2015-09-19 DIAGNOSIS — R269 Unspecified abnormalities of gait and mobility: Secondary | ICD-10-CM | POA: Diagnosis not present

## 2015-09-19 DIAGNOSIS — Z89512 Acquired absence of left leg below knee: Secondary | ICD-10-CM

## 2015-09-19 DIAGNOSIS — M792 Neuralgia and neuritis, unspecified: Secondary | ICD-10-CM

## 2015-09-19 DIAGNOSIS — M545 Low back pain: Secondary | ICD-10-CM | POA: Diagnosis not present

## 2015-09-19 DIAGNOSIS — R531 Weakness: Secondary | ICD-10-CM

## 2015-09-19 DIAGNOSIS — R6889 Other general symptoms and signs: Secondary | ICD-10-CM

## 2015-09-19 NOTE — Therapy (Signed)
[facility] [address] [place], [place], [address] Phone: [phone]   Fax:  [phone]  Physical Therapy Treatment  Patient Details  Name: Jennifer [name] MRN: [id number] Date of Birth: 25-Nov-1961 Referring Provider:  Bartholomew Crews, MD  Encounter Date: 09/19/2015      PT End of Session - 09/19/15 1230    Visit Number 11   Number of Visits 18   Date for PT Re-Evaluation 10/12/15   Authorization Type Medicare G-Code 10th visit   PT Start Time 1232   PT Stop Time 1315   PT Time Calculation (min) 43 min   Equipment Utilized During Treatment Gait belt   Activity Tolerance Patient tolerated treatment well   Behavior During Therapy [facility] for tasks assessed/performed      Past Medical History  Diagnosis Date  . Depression   . GERD (gastroesophageal reflux disease)   . Hyperlipidemia   . Hypertension   . Glaucoma   . PVD (peripheral vascular disease) with claudication     Stents to bilateral common iliac arteries (left 2005, right 2008), on chronic plavix  . Hyperplastic colon polyp 12/2010    Per colonoscopy (12/2010) - Dr. Deatra Ina  . Asthma   . COPD 01/08/2007    PFT's 05/2007 : FEV1/FVC 82, FEV1 64% pred, FEF 25-75% 40% predicted, 16% improvement in FEV1 with bronchodilators.     . Tobacco abuse   . Chronic osteomyelitis of foot     chronic, right secondary to diabetic foot ulcers  . Polymicrobial bacterial infection 01/2013    GBS and S. aureus bacteremia // Source likely infected diabetic foot ulcer  . 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 Blood cultures 01/26/13 S. Aureus and GBS. Blood cultures Feb 6th, 8th, and 9th and March were negative.Repeat TEE 3/20 negative for vegitations  . 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 Chronic diastolic heart failure     grade 2 per 2D echocardiogram (01/2013)  . Ulcer of foot, chronic    Left. No OM per MRI (01/2013)  . DVT of upper extremity (deep vein thrombosis) 03/11/2013    Secondary to PICC line. Right brachial vein, diagnosed on 03/10/2013 Coumadin for 3 months. End date 06/10/2013   . Chronic pain syndrome 12/03/2011    Likely secondary to depression, "fibromyalgia", neuropathy, and obesity. Lumbar MRI 2014 no sig change from prior (2008) : Stable hypertrophic facet disease most notable at L4-5. Stable shallow left foraminal/extraforaminal disc protrusion at L4-5. No direct neural compression.      . Environmental allergies     Hx: of  . Fatty liver 2003    observed on ultrasound abdomen  . CHF (congestive heart failure)   . Anginal pain     '3' of 10 ischemia ruled out 9/9   . Fibromyalgia   . Chronic bronchitis     "I get it alot" (09/28/2013)  . Exertional shortness of breath   . Rheumatoid arthritis   . Arthritis of lumbar spine   . Chronic lower back pain   . Diabetic peripheral neuropathy   . Type II diabetes mellitus with peripheral circulatory disorders, uncontrolled DX: 1993    Insulin dep. Poor control. Complicated by diabetic foot ulcer and diabetic eye disease.    . 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 2/2 tobacco use, DM, and  cont weight bearing on surgical wound and developed gangrene     Past Surgical History  Procedure Laterality Date  . Abdominal hysterectomy  1997    secondary to uterine fibroids  . Tubal ligation    . Shoulder arthroscopy w/ rotator cuff repair Bilateral   . Breast biopsy      multiple-benign per pt  . Wrist surgery Right     "for tumors" (09/28/2013)  . Ganglion cyst excision      multiple  . Other surgical history      stents in lower ext  . Tee without cardioversion N/A 01/31/2013    Procedure: TRANSESOPHAGEAL ECHOCARDIOGRAM (TEE);  Surgeon: Fay Records, MD;  Location: [facility];  Service: Cardiovascular;  Laterality: N/A;  Rm [phone]  .  Bladder surgery      bladder reconstruction surgery  . Tee without cardioversion N/A 03/10/2013    Procedure: TRANSESOPHAGEAL ECHOCARDIOGRAM (TEE);  Surgeon: Larey Dresser, MD;  Location: [facility];  Service: Cardiovascular;  Laterality: N/A;  Rm. [phone]  . Skin split graft Bilateral 05/13/2013    Procedure: Right and Left Foot Allograft Skin Graft;  Surgeon: Newt Minion, MD;  Location: [facility];  Service: Orthopedics;  Laterality: Bilateral;  Right and Left Foot Allograft Skin Graft  . Toe amputation Left 08/31/2013    4TH & 5 TH TOE   . Amputation Left 08/31/2013    Procedure: AMPUTATION RAY;  Surgeon: Newt Minion, MD;  Location: [facility];  Service: Orthopedics;  Laterality: Left;  Left Foot 5th Ray Amputation  . Esophagogastroduodenoscopy N/A 09/20/2013    Procedure: ESOPHAGOGASTRODUODENOSCOPY (EGD);  Surgeon: Jerene Bears, MD;  Location: [facility];  Service: Gastroenterology;  Laterality: N/A;  . Foot amputation through metatarsal Left 09/28/2013  . Tonsillectomy    . Amputation Left 09/28/2013    Procedure: Left Midfoot amputation;  Surgeon: Newt Minion, MD;  Location: [facility];  Service: Orthopedics;  Laterality: Left;  Left Midfoot amputation  . Amputation Left 10/14/2013    Procedure: AMPUTATION BELOW KNEE- left;  Surgeon: Newt Minion, MD;  Location: [facility];  Service: Orthopedics;  Laterality: Left;  Left Below Knee Amputation     There were no vitals filed for this visit.  Visit Diagnosis:  Abnormality of gait  Activity intolerance  Weakness generalized  Status post below knee amputation of left lower extremity  Decreased functional activity tolerance  Pain, neuropathic      Subjective Assessment - 09/19/15 1230    Subjective No new issues. Wearing prosthesis all awake hours. She is doing her exercises.   Currently in Pain? Yes   Pain Score 6    Pain Location Generalized   Pain Descriptors / Indicators Aching;Burning   Pain Type Chronic pain   Pain Onset More than a  month ago   Pain Frequency Constant   Aggravating Factors  increased activity, weather changes   Pain Relieving Factors meds, rest    Multiple Pain Sites No                         [facility] Adult PT Treatment/Exercise - 09/19/15 1230    Transfers   Sit to Stand 5: Supervision;With upper extremity assist;With armrests;From chair/3-in-1   Sit to Stand Details Verbal cues for safe use of DME/AE   Stand to Sit 5: Supervision;With upper extremity assist;With armrests;To chair/3-in-1   Stand to Sit Details (indicate cue type and reason) Verbal cues for safe use of  DME/AE;Verbal cues for precautions/safety   Ambulation/Gait   Ambulation/Gait Yes   Ambulation/Gait Assistance 5: Supervision   Ambulation/Gait Assistance Details cues on posture, position within rollator for safety & back support,    Ambulation Distance (Feet) 94 Feet  94', 120', 40', 65', 129'   Assistive device Prosthesis;Rollator   Gait Pattern Step-to pattern;Decreased step length - right;Decreased stance time - left;Decreased stride length;Decreased hip/knee flexion - right;Decreased weight shift to right;Right hip hike;Trunk flexed;Abducted- right;Poor foot clearance - right   Ambulation Surface Indoor;Level;Outdoor;Paved   Ramp 5: Supervision  rollator & prostheses   Ramp Details (indicate cue type and reason) PT demo, instructed in technique with rollator & prosthesis   Curb 5: Supervision  rollator & prostheses   Curb Details (indicate cue type and reason) demo, instruction in technique with rollator & prostheses   Timed Up and Go Test   TUG --   Normal TUG (seconds) --   Knee/Hip Exercises: Aerobic   Other Aerobic --                PT Education - 09/19/15 1230    Education provided Yes   Education Details Silver Sneakers and fitness program to supplement PT now and continue once PT discharges, activity level without increasing pain to intolerable level   Person(s) Educated Patient   Methods  Explanation;Verbal cues   Comprehension Verbalized understanding;Verbal cues required;Need further instruction          PT Short Term Goals - 09/12/15 1302    PT SHORT TERM GOAL #1   Title Patient demonstrates understanding of initail HEP with minimal cues. (Target Date: 09/12/2015)   Baseline 09/12/15: pt independent with OTAGO program   Status Achieved   PT SHORT TERM GOAL #2   Title patient verbalizes understanding of fall prevention strategies in home. (Target Date: 09/12/2015)   Baseline 09/12/15: pt given strategies on 08/28/15, able to recount most of them today   Status Partially Met   PT SHORT TERM GOAL #3   Title patient verbalizes understanding proper activity modification to assist with pain management. (Target Date: 09/12/2015)   Baseline 09/12/15: pt verbalizes she knows she needs to exercise, not all at once, daily for best results, however not actually doing this on daily basis   Status Achieved           PT Long Term Goals - 08/31/15 1500    PT LONG TERM GOAL #1   Title Patient verbalizes & demonstrates understanding of ongoing HEP / fitness plan. (Target Date: 10/12/2015)   Time 2   Period Months   Status On-going   PT LONG TERM GOAL #2   Title Patient demonstrates / verbalizes managing activity level to manage pain. (Target Date: 10/12/2015)   Time 2   Period Months   Status On-going   PT LONG TERM GOAL #3   Title Patient ambulates 250' with rolling walker or rollator walker & prosthesis modified independent.  (Target Date: 10/12/2015)   Time 2   Period Months   Status On-going   PT LONG TERM GOAL #4   Title Timed Up & Go with rolling walker & prosthesis <20 seconds safely to decrease risk of falls.  (Target Date: 10/12/2015)   Time 2   Period Months   Status On-going   PT LONG TERM GOAL #5   Title Berg Balance >20 / 56  (Target Date: 10/12/2015)   Time 2   Period Months   Status On-going   PT LONG TERM GOAL #  6   Title Patient negotiates ramps & curbs  with rolling walker & stairs with 1 rail with prosthesis with supervision.  (Target Date: 10/12/2015)   Time 2   Period Months   Status On-going   PT LONG TERM GOAL #7   Title Patient self reports via FOTO improvement in functional status by > 5 points.  (Target Date: 10/12/2015)   Time 2   Period Months   Status On-going               Plan - 09/19/15 1230    Clinical Impression Statement Patient appears to understand how too little activity causes stiffness and pain and over activity increases her pain creating a repetitive cycle. Patient was able to rest appropriate with rollator seat to enable increase activity tolerance. Patient reported her pain only increased from 6/10 to 7/10 with gait activiies.   Pt will benefit from skilled therapeutic intervention in order to improve on the following deficits Abnormal gait;Decreased activity tolerance;Decreased balance;Decreased endurance;Decreased knowledge of use of DME;Decreased mobility;Decreased range of motion;Decreased strength;Impaired flexibility;Prosthetic Dependency;Postural dysfunction;Pain   Rehab Potential Good   Clinical Impairments Affecting Rehab Potential chronic medical conditions with non-compliance    PT Frequency 2x / week   PT Duration Other (comment)  9 weeks   PT Treatment/Interventions ADLs/Self Care Home Management;Moist Heat;DME Instruction;Gait training;Stair training;Functional mobility training;Therapeutic activities;Therapeutic exercise;Balance training;Neuromuscular re-education;Patient/family education;Prosthetic Training;Manual techniques;Passive range of motion   PT Next Visit Plan Gait with rollator for community based activities;continue with strengthening and activity tolerance activities.   Consulted and Agree with Plan of Care Patient        Problem List Patient Active Problem List   Diagnosis Date Noted  . 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 Uncontrolled diabetes mellitus with  retinopathy, due to underlying condition, without macular edema 09/05/2015  . Diabetic retinopathy 09/05/2015  . Skin tag 08/08/2015  . Counseling regarding end of life decision making 06/14/2015  . Atherosclerosis of aorta 04/04/2015  . Anemia 10/05/2014  . Chronic diastolic heart failure   . S/P BKA (below knee amputation) unilateral   . Tobacco abuse   . Severe obesity (BMI >= 40) 03/02/2013  . Abnormality of gait 03/01/2013  . Infective endocarditis 01/22/2013  . Healthcare maintenance 07/10/2012  . Opioid dependence 12/03/2011  . PVD in DM 08/27/2011  . GLAUCOMA 11/29/2009  . Essential hypertension 11/29/2009  . Chronic insomnia 10/25/2009  . GASTROESOPHAGEAL REFLUX DISEASE 11/24/2008  . Depression, major, severe recurrence 04/06/2008  . DM (diabetes mellitus) type II uncontrolled, periph vascular disorder 04/02/2007  . Hyperlipemia 01/08/2007  . COPD (chronic obstructive pulmonary disease) 01/08/2007    Jamey Reas PT, DPT 09/19/2015, 10:00 PM  [facility] [address] [place], [place], [address] Phone: [phone]   Fax:  [phone]

## 2015-09-24 ENCOUNTER — Ambulatory Visit: Payer: Medicare Other | Attending: Internal Medicine | Admitting: Rehabilitation

## 2015-09-24 ENCOUNTER — Encounter: Payer: Self-pay | Admitting: Rehabilitation

## 2015-09-24 DIAGNOSIS — M792 Neuralgia and neuritis, unspecified: Secondary | ICD-10-CM | POA: Insufficient documentation

## 2015-09-24 DIAGNOSIS — Z89512 Acquired absence of left leg below knee: Secondary | ICD-10-CM | POA: Insufficient documentation

## 2015-09-24 DIAGNOSIS — R6889 Other general symptoms and signs: Secondary | ICD-10-CM | POA: Diagnosis present

## 2015-09-24 DIAGNOSIS — R269 Unspecified abnormalities of gait and mobility: Secondary | ICD-10-CM | POA: Diagnosis not present

## 2015-09-24 DIAGNOSIS — R531 Weakness: Secondary | ICD-10-CM | POA: Diagnosis present

## 2015-09-24 DIAGNOSIS — M545 Low back pain: Secondary | ICD-10-CM | POA: Insufficient documentation

## 2015-09-24 NOTE — Therapy (Signed)
[facility] [address] [place], [place], [address] Phone: [phone]   Fax:  [phone]  Physical Therapy Treatment  Patient Details  Name: Jennifer [name] MRN: [id number] Date of Birth: 1961-10-21 Referring Provider:  Bartholomew Crews, MD  Encounter Date: 09/24/2015      PT End of Session - 09/24/15 1821    Visit Number 12   Number of Visits 18   Date for PT Re-Evaluation 10/12/15   Authorization Type Medicare G-Code 10th visit   PT Start Time 1230   PT Stop Time 1316   PT Time Calculation (min) 46 min   Equipment Utilized During Treatment Gait belt   Activity Tolerance Patient tolerated treatment well   Behavior During Therapy [facility] for tasks assessed/performed      Past Medical History  Diagnosis Date  . Depression   . GERD (gastroesophageal reflux disease)   . Hyperlipidemia   . Hypertension   . Glaucoma   . PVD (peripheral vascular disease) with claudication ([place])     Stents to bilateral common iliac arteries (left 2005, right 2008), on chronic plavix  . Hyperplastic colon polyp 12/2010    Per colonoscopy (12/2010) - Dr. Deatra Ina  . Asthma   . COPD 01/08/2007    PFT's 05/2007 : FEV1/FVC 82, FEV1 64% pred, FEF 25-75% 40% predicted, 16% improvement in FEV1 with bronchodilators.     . Tobacco abuse   . Chronic osteomyelitis of foot (HCC)     chronic, right secondary to diabetic foot ulcers  . Polymicrobial bacterial infection 01/2013    GBS and S. aureus bacteremia // Source likely infected diabetic foot ulcer  . 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 Blood cultures 01/26/13 S. Aureus and GBS. Blood cultures Feb 6th, 8th, and 9th and March were negative.Repeat TEE 3/20 negative for vegitations  . 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 Chronic diastolic heart failure (HCC)     grade 2 per 2D echocardiogram (01/2013)  . Ulcer  of foot, chronic (HCC)     Left. No OM per MRI (01/2013)  . DVT of upper extremity (deep vein thrombosis) ([place]) 03/11/2013    Secondary to PICC line. Right brachial vein, diagnosed on 03/10/2013 Coumadin for 3 months. End date 06/10/2013   . Chronic pain syndrome 12/03/2011    Likely secondary to depression, "fibromyalgia", neuropathy, and obesity. Lumbar MRI 2014 no sig change from prior (2008) : Stable hypertrophic facet disease most notable at L4-5. Stable shallow left foraminal/extraforaminal disc protrusion at L4-5. No direct neural compression.      . Environmental allergies     Hx: of  . Fatty liver 2003    observed on ultrasound abdomen  . CHF (congestive heart failure) ([place])   . Anginal pain [facility])     '3' of 10 ischemia ruled out 9/9   . Fibromyalgia   . Chronic bronchitis ([place])     "I get it alot" (09/28/2013)  . Exertional shortness of breath   . Rheumatoid arthritis ([place])   . Arthritis of lumbar spine   . Chronic lower back pain   . Diabetic peripheral neuropathy ([place])   . Type II diabetes mellitus with peripheral circulatory disorders, uncontrolled DX: 1993    Insulin dep. Poor control. Complicated by diabetic foot ulcer and diabetic eye disease.    . 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 2/2 tobacco use, DM, and cont weight bearing on surgical wound and developed gangrene     Past Surgical History  Procedure Laterality Date  . Abdominal hysterectomy  1997    secondary to uterine fibroids  . Tubal ligation    . Shoulder arthroscopy w/ rotator cuff repair Bilateral   . Breast biopsy      multiple-benign per pt  . Wrist surgery Right     "for tumors" (09/28/2013)  . Ganglion cyst excision      multiple  . Other surgical history      stents in lower ext  . Tee without cardioversion N/A 01/31/2013    Procedure: TRANSESOPHAGEAL ECHOCARDIOGRAM (TEE);  Surgeon: Fay Records, MD;  Location: [place];  Service: Cardiovascular;  Laterality: N/A;  Rm [phone]  . Bladder surgery      bladder reconstruction surgery  . Tee without cardioversion N/A 03/10/2013    Procedure: TRANSESOPHAGEAL ECHOCARDIOGRAM (TEE);  Surgeon: Larey Dresser, MD;  Location: [place];  Service: Cardiovascular;  Laterality: N/A;  Rm. [phone]  . Skin split graft Bilateral 05/13/2013    Procedure: Right and Left Foot Allograft Skin Graft;  Surgeon: Newt Minion, MD;  Location: [place];  Service: Orthopedics;  Laterality: Bilateral;  Right and Left Foot Allograft Skin Graft  . Toe amputation Left 08/31/2013    4TH & 5 TH TOE   . Amputation Left 08/31/2013    Procedure: AMPUTATION RAY;  Surgeon: Newt Minion, MD;  Location: [place];  Service: Orthopedics;  Laterality: Left;  Left Foot 5th Ray Amputation  . Esophagogastroduodenoscopy N/A 09/20/2013    Procedure: ESOPHAGOGASTRODUODENOSCOPY (EGD);  Surgeon: Jerene Bears, MD;  Location: [place];  Service: Gastroenterology;  Laterality: N/A;  . Foot amputation through metatarsal Left 09/28/2013  . Tonsillectomy    . Amputation Left 09/28/2013    Procedure: Left Midfoot amputation;  Surgeon: Newt Minion, MD;  Location: [place];  Service: Orthopedics;  Laterality: Left;  Left Midfoot amputation  . Amputation Left 10/14/2013    Procedure: AMPUTATION BELOW KNEE- left;  Surgeon: Newt Minion, MD;  Location: [place];  Service: Orthopedics;  Laterality: Left;  Left Below Knee Amputation     There were no vitals filed for this visit.  Visit Diagnosis:  Abnormality of gait  Activity intolerance  Weakness generalized      Subjective Assessment - 09/24/15 1233    Subjective Pt reports she fell this morning going from bedside commode to bed, missed bed, did not have leg on.  Son had to help to get her up.  Reports no injury or pain following incident.  Provided recommendations for squat pivot or stand pivot transfer for increased safety.    Patient Stated Goals To use  prosthesis to walk further & not have to worry about falling.   Currently in Pain? Yes   Pain Score 8    Pain Location Generalized   Pain Orientation --  hands and feet   Pain Descriptors / Indicators Aching;Burning   Pain Type Chronic pain   Pain Onset More than a month ago   Pain Frequency Constant   Aggravating Factors  rain (weather changes), increased activity.    Pain Relieving Factors rest, meds                          [facility] Adult PT Treatment/Exercise - 09/24/15 1239  Transfers   Sit to Stand 5: Supervision;With upper extremity assist;With armrests;From chair/3-in-1   Sit to Stand Details Verbal cues for safe use of DME/AE   Stand to Sit 5: Supervision;With upper extremity assist;With armrests;To chair/3-in-1   Stand to Sit Details (indicate cue type and reason) Verbal cues for safe use of DME/AE;Verbal cues for precautions/safety   Ambulation/Gait   Ambulation/Gait Yes   Ambulation/Gait Assistance 5: Supervision   Ambulation Distance (Feet) 85 Feet  90', 20', 45', 75'   Assistive device Prosthesis;Rollator   Gait Pattern Step-to pattern;Decreased step length - right;Decreased stance time - left;Decreased stride length;Decreased hip/knee flexion - right;Decreased weight shift to right;Right hip hike;Trunk flexed;Abducted- right;Poor foot clearance - right   Ambulation Surface Level;Unlevel;Indoor;Outdoor;Paved   Ramp --   Curb --   Gait Comments Pt requires multiple rest breaks during gait training due to fatigue and back pain.  Provided education during gait for safe use of rollator, safely locking brakes before sitting, and upright posture.     Self-Care   Self-Care Other Self-Care Comments   Other Self-Care Comments  Continue to educate on importance of maintaining activity during day to decrease stiffness pain, however not over exerting.  Pt stating feeling more depressed lately.  Provided education on exercise and activity's effect on mood and  behavior.  Pt verbalized understanding.     Exercises   Exercises Other Exercises   Other Exercises  Performed supine R glute/piriformis stretch due to increased pain from back down RLE.  Performed x 2 reps of 30 secs with mild relief.  Also performed core strengthening exercises with BLE bridging x 10 reps, posterior pelvic tilts x 10 reps progressing to posterior pelvic tilt with alternating marching x 10 reps BLE.  Tolerated well with mild improvement in back pain.  Progressed to standing exercise for BLE strengthening and endurance; side stepping with squats along counter top for UE support x 2 reps with seated rest break in between.  Cues throughout for technique and safety.                  PT Education - 09/24/15 1238    Education provided Yes   Education Details Continue to educate on silver sneaker fitness program.    Person(s) Educated Patient   Methods Explanation   Comprehension Verbalized understanding;Need further instruction          PT Short Term Goals - 09/12/15 1302    PT SHORT TERM GOAL #1   Title Patient demonstrates understanding of initail HEP with minimal cues. (Target Date: 09/12/2015)   Baseline 09/12/15: pt independent with OTAGO program   Status Achieved   PT SHORT TERM GOAL #2   Title patient verbalizes understanding of fall prevention strategies in home. (Target Date: 09/12/2015)   Baseline 09/12/15: pt given strategies on 08/28/15, able to recount most of them today   Status Partially Met   PT SHORT TERM GOAL #3   Title patient verbalizes understanding proper activity modification to assist with pain management. (Target Date: 09/12/2015)   Baseline 09/12/15: pt verbalizes she knows she needs to exercise, not all at once, daily for best results, however not actually doing this on daily basis   Status Achieved           PT Long Term Goals - 08/31/15 1500    PT LONG TERM GOAL #1   Title Patient verbalizes & demonstrates understanding of ongoing HEP /  fitness plan. (Target Date: 10/12/2015)   Time 2  Period Months   Status On-going   PT LONG TERM GOAL #2   Title Patient demonstrates / verbalizes managing activity level to manage pain. (Target Date: 10/12/2015)   Time 2   Period Months   Status On-going   PT LONG TERM GOAL #3   Title Patient ambulates 250' with rolling walker or rollator walker & prosthesis modified independent.  (Target Date: 10/12/2015)   Time 2   Period Months   Status On-going   PT LONG TERM GOAL #4   Title Timed Up & Go with rolling walker & prosthesis <20 seconds safely to decrease risk of falls.  (Target Date: 10/12/2015)   Time 2   Period Months   Status On-going   PT LONG TERM GOAL #5   Title Berg Balance >20 / 56  (Target Date: 10/12/2015)   Time 2   Period Months   Status On-going   PT LONG TERM GOAL #6   Title Patient negotiates ramps & curbs with rolling walker & stairs with 1 rail with prosthesis with supervision.  (Target Date: 10/12/2015)   Time 2   Period Months   Status On-going   PT LONG TERM GOAL #7   Title Patient self reports via FOTO improvement in functional status by > 5 points.  (Target Date: 10/12/2015)   Time 2   Period Months   Status On-going               Plan - 09/24/15 1822    Clinical Impression Statement Continue to address balance between increased activity and avoiding over exertion during session with increased bouts of gait.  Also addressed gait on outdoor surfaces (paved) with rollator to address LTG's and improve community ambulation.  Requires multiple breaks due to pain.  Also continue to address BLE strengthening and overall conditioning.  Tolerated well, continue POC.    Pt will benefit from skilled therapeutic intervention in order to improve on the following deficits Abnormal gait;Decreased activity tolerance;Decreased balance;Decreased endurance;Decreased knowledge of use of DME;Decreased mobility;Decreased range of motion;Decreased strength;Impaired  flexibility;Prosthetic Dependency;Postural dysfunction;Pain   Rehab Potential Good   Clinical Impairments Affecting Rehab Potential chronic medical conditions with non-compliance    PT Frequency 2x / week   PT Duration Other (comment)  9 weeks   PT Treatment/Interventions ADLs/Self Care Home Management;Moist Heat;DME Instruction;Gait training;Stair training;Functional mobility training;Therapeutic activities;Therapeutic exercise;Balance training;Neuromuscular re-education;Patient/family education;Prosthetic Training;Manual techniques;Passive range of motion   PT Next Visit Plan Gait with rollator for community based activities;continue with strengthening and activity tolerance activities.   Consulted and Agree with Plan of Care Patient        Problem List Patient Active Problem List   Diagnosis Date Noted  . Type 2 diabetes with nephropathy ([place]) 09/07/2015  . Uncontrolled diabetes mellitus with retinopathy, due to underlying condition, without macular edema ([place]) 09/05/2015  . Diabetic retinopathy ([place]) 09/05/2015  . Skin tag 08/08/2015  . Counseling regarding end of life decision making 06/14/2015  . Atherosclerosis of aorta ([place]) 04/04/2015  . Anemia 10/05/2014  . Chronic diastolic heart failure ([place])   . S/P BKA (below knee amputation) unilateral ([place])   . Tobacco abuse   . Severe obesity (BMI >= 40) ([place]) 03/02/2013  . Abnormality of gait 03/01/2013  . Infective endocarditis 01/22/2013  . Healthcare maintenance 07/10/2012  . Opioid dependence ([place]) 12/03/2011  . PVD in DM 08/27/2011  . GLAUCOMA 11/29/2009  . Essential hypertension 11/29/2009  . Chronic insomnia 10/25/2009  . GASTROESOPHAGEAL REFLUX DISEASE 11/24/2008  . Depression,  major, severe recurrence ([place]) 04/06/2008  . DM (diabetes mellitus) type II uncontrolled, periph vascular disorder ([place]) 04/02/2007  . Hyperlipemia 01/08/2007  . COPD (chronic obstructive pulmonary disease) ([place]) 01/08/2007   Cameron Sprang,  PT, MPT [facility] [address] [place], [place], [address] Phone: [phone]   Fax:  [phone] 09/24/2015, 6:26 PM

## 2015-09-25 ENCOUNTER — Ambulatory Visit: Payer: Medicare Other | Admitting: Dietician

## 2015-09-26 ENCOUNTER — Ambulatory Visit: Payer: Medicare Other | Admitting: Physical Therapy

## 2015-09-26 ENCOUNTER — Encounter: Payer: Self-pay | Admitting: Physical Therapy

## 2015-09-26 DIAGNOSIS — Z89512 Acquired absence of left leg below knee: Secondary | ICD-10-CM

## 2015-09-26 DIAGNOSIS — R6889 Other general symptoms and signs: Secondary | ICD-10-CM | POA: Diagnosis not present

## 2015-09-26 DIAGNOSIS — R531 Weakness: Secondary | ICD-10-CM | POA: Diagnosis not present

## 2015-09-26 DIAGNOSIS — M792 Neuralgia and neuritis, unspecified: Secondary | ICD-10-CM | POA: Diagnosis not present

## 2015-09-26 DIAGNOSIS — R269 Unspecified abnormalities of gait and mobility: Secondary | ICD-10-CM | POA: Diagnosis not present

## 2015-09-26 DIAGNOSIS — M545 Low back pain: Secondary | ICD-10-CM | POA: Diagnosis not present

## 2015-09-27 ENCOUNTER — Ambulatory Visit: Payer: Medicare Other | Admitting: Dietician

## 2015-09-27 NOTE — Therapy (Signed)
[facility] [address] [place], [place], [address] Phone: [phone]   Fax:  [phone]  Physical Therapy Treatment  Patient Details  Name: Jennifer [name] MRN: [id number] Date of Birth: 01/18/1961 Referring Provider:  Bartholomew Crews, MD  Encounter Date: 09/26/2015      PT End of Session - 09/26/15 1233    Visit Number 13   Number of Visits 18   Date for PT Re-Evaluation 10/12/15   Authorization Type Medicare G-Code 10th visit   PT Start Time 1231   PT Stop Time 1315   PT Time Calculation (min) 44 min   Equipment Utilized During Treatment Gait belt   Activity Tolerance Patient tolerated treatment well   Behavior During Therapy [facility] for tasks assessed/performed      Past Medical History  Diagnosis Date  . Depression   . GERD (gastroesophageal reflux disease)   . Hyperlipidemia   . Hypertension   . Glaucoma   . PVD (peripheral vascular disease) with claudication ([place])     Stents to bilateral common iliac arteries (left 2005, right 2008), on chronic plavix  . Hyperplastic colon polyp 12/2010    Per colonoscopy (12/2010) - Dr. Deatra Ina  . Asthma   . COPD 01/08/2007    PFT's 05/2007 : FEV1/FVC 82, FEV1 64% pred, FEF 25-75% 40% predicted, 16% improvement in FEV1 with bronchodilators.     . Tobacco abuse   . Chronic osteomyelitis of foot (HCC)     chronic, right secondary to diabetic foot ulcers  . Polymicrobial bacterial infection 01/2013    GBS and S. aureus bacteremia // Source likely infected diabetic foot ulcer  . 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 Blood cultures 01/26/13 S. Aureus and GBS. Blood cultures Feb 6th, 8th, and 9th and March were negative.Repeat TEE 3/20 negative for vegitations  . 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 Chronic diastolic heart failure (HCC)     grade 2 per 2D echocardiogram (01/2013)  . Ulcer  of foot, chronic (HCC)     Left. No OM per MRI (01/2013)  . DVT of upper extremity (deep vein thrombosis) ([place]) 03/11/2013    Secondary to PICC line. Right brachial vein, diagnosed on 03/10/2013 Coumadin for 3 months. End date 06/10/2013   . Chronic pain syndrome 12/03/2011    Likely secondary to depression, "fibromyalgia", neuropathy, and obesity. Lumbar MRI 2014 no sig change from prior (2008) : Stable hypertrophic facet disease most notable at L4-5. Stable shallow left foraminal/extraforaminal disc protrusion at L4-5. No direct neural compression.      . Environmental allergies     Hx: of  . Fatty liver 2003    observed on ultrasound abdomen  . CHF (congestive heart failure) ([place])   . Anginal pain [facility])     '3' of 10 ischemia ruled out 9/9   . Fibromyalgia   . Chronic bronchitis ([place])     "I get it alot" (09/28/2013)  . Exertional shortness of breath   . Rheumatoid arthritis ([place])   . Arthritis of lumbar spine   . Chronic lower back pain   . Diabetic peripheral neuropathy ([place])   . Type II diabetes mellitus with peripheral circulatory disorders, uncontrolled DX: 1993    Insulin dep. Poor control. Complicated by diabetic foot ulcer and diabetic eye disease.    . 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 2/2 tobacco use, DM, and cont weight bearing on surgical wound and developed gangrene     Past Surgical History  Procedure Laterality Date  . Abdominal hysterectomy  1997    secondary to uterine fibroids  . Tubal ligation    . Shoulder arthroscopy w/ rotator cuff repair Bilateral   . Breast biopsy      multiple-benign per pt  . Wrist surgery Right     "for tumors" (09/28/2013)  . Ganglion cyst excision      multiple  . Other surgical history      stents in lower ext  . Tee without cardioversion N/A 01/31/2013    Procedure: TRANSESOPHAGEAL ECHOCARDIOGRAM (TEE);  Surgeon: Fay Records, MD;  Location: [facility];  Service: Cardiovascular;  Laterality: N/A;  Rm [phone]  . Bladder surgery      bladder reconstruction surgery  . Tee without cardioversion N/A 03/10/2013    Procedure: TRANSESOPHAGEAL ECHOCARDIOGRAM (TEE);  Surgeon: Larey Dresser, MD;  Location: [facility];  Service: Cardiovascular;  Laterality: N/A;  Rm. [id number]  . Skin split graft Bilateral 05/13/2013    Procedure: Right and Left Foot Allograft Skin Graft;  Surgeon: Newt Minion, MD;  Location: [facility];  Service: Orthopedics;  Laterality: Bilateral;  Right and Left Foot Allograft Skin Graft  . Toe amputation Left 08/31/2013    4TH & 5 TH TOE   . Amputation Left 08/31/2013    Procedure: AMPUTATION RAY;  Surgeon: Newt Minion, MD;  Location: [facility];  Service: Orthopedics;  Laterality: Left;  Left Foot 5th Ray Amputation  . Esophagogastroduodenoscopy N/A 09/20/2013    Procedure: ESOPHAGOGASTRODUODENOSCOPY (EGD);  Surgeon: Jerene Bears, MD;  Location: [facility];  Service: Gastroenterology;  Laterality: N/A;  . Foot amputation through metatarsal Left 09/28/2013  . Tonsillectomy    . Amputation Left 09/28/2013    Procedure: Left Midfoot amputation;  Surgeon: Newt Minion, MD;  Location: [facility];  Service: Orthopedics;  Laterality: Left;  Left Midfoot amputation  . Amputation Left 10/14/2013    Procedure: AMPUTATION BELOW KNEE- left;  Surgeon: Newt Minion, MD;  Location: [facility];  Service: Orthopedics;  Laterality: Left;  Left Below Knee Amputation     There were no vitals filed for this visit.  Visit Diagnosis:  Abnormality of gait  Activity intolerance  Weakness generalized  Decreased functional activity tolerance  Status post below knee amputation of left lower extremity (HCC)      Subjective Assessment - 09/26/15 1232    Subjective No new complaints. No new falls since last reported one.    Currently in Pain? Yes   Pain Score 8    Pain Location Generalized   Pain Orientation --  hands and feet    Pain Descriptors /  Indicators Aching;Sore   Pain Type Chronic pain   Pain Onset More than a month ago   Pain Frequency Constant   Aggravating Factors  rainy weather, increased activity   Pain Relieving Factors rest, medication          [facility] Adult PT Treatment/Exercise - 09/26/15 1236    Transfers   Sit to Stand 5: Supervision;With upper extremity assist;With armrests;From chair/3-in-1   Sit to Stand Details Verbal cues for safe use of DME/AE   Stand to Sit 5: Supervision;With upper extremity assist;With armrests;To chair/3-in-1   Stand to Sit Details (indicate cue type and reason) Verbal cues for safe use of DME/AE  Ambulation/Gait   Ambulation/Gait Yes   Ambulation/Gait Assistance 5: Supervision   Ambulation/Gait Assistance Details cues posture and to correct gait deviations. cues on rollator proximity with gait. seated rest break between each bout of gait.   Ambulation Distance (Feet) 150 Feet  x 2 reps; 120 feet (plus ramp/curb)   Assistive device Prosthesis;Rollator   Gait Pattern Step-to pattern;Decreased step length - right;Decreased stance time - left;Decreased stride length;Decreased hip/knee flexion - right;Decreased weight shift to right;Right hip hike;Trunk flexed;Abducted- right;Poor foot clearance - right   Ambulation Surface Level;Indoor   Ramp 5: Supervision;4: Min assist  min assit with descending ramp, using rollator/prosthesis   Ramp Details (indicate cue type and reason) cues on sequence and technique. cues on brake use on rollator for safety   Curb 5: Supervision  with rollator/rpsothesis   Curb Details (indicate cue type and reason) cues on sequence   High Level Balance   High Level Balance Activities Marching forwards;Marching backwards;Tandem walking;Side stepping  tandem walking fwd/bwd;side stepping with squats   High Level Balance Comments 3 laps each /each way. cues on posture, decreased UE reliance on parallel bars.   Prosthetics   Current prosthetic wear tolerance  (days/week)  daily   Current prosthetic wear tolerance (#hours/day)  reports most of awake hours           PT Short Term Goals - 09/12/15 1302    PT SHORT TERM GOAL #1   Title Patient demonstrates understanding of initail HEP with minimal cues. (Target Date: 09/12/2015)   Baseline 09/12/15: pt independent with OTAGO program   Status Achieved   PT SHORT TERM GOAL #2   Title patient verbalizes understanding of fall prevention strategies in home. (Target Date: 09/12/2015)   Baseline 09/12/15: pt given strategies on 08/28/15, able to recount most of them today   Status Partially Met   PT SHORT TERM GOAL #3   Title patient verbalizes understanding proper activity modification to assist with pain management. (Target Date: 09/12/2015)   Baseline 09/12/15: pt verbalizes she knows she needs to exercise, not all at once, daily for best results, however not actually doing this on daily basis   Status Achieved           PT Long Term Goals - 08/31/15 1500    PT LONG TERM GOAL #1   Title Patient verbalizes & demonstrates understanding of ongoing HEP / fitness plan. (Target Date: 10/12/2015)   Time 2   Period Months   Status On-going   PT LONG TERM GOAL #2   Title Patient demonstrates / verbalizes managing activity level to manage pain. (Target Date: 10/12/2015)   Time 2   Period Months   Status On-going   PT LONG TERM GOAL #3   Title Patient ambulates 250' with rolling walker or rollator walker & prosthesis modified independent.  (Target Date: 10/12/2015)   Time 2   Period Months   Status On-going   PT LONG TERM GOAL #4   Title Timed Up & Go with rolling walker & prosthesis <20 seconds safely to decrease risk of falls.  (Target Date: 10/12/2015)   Time 2   Period Months   Status On-going   PT LONG TERM GOAL #5   Title Berg Balance >20 / 56  (Target Date: 10/12/2015)   Time 2   Period Months   Status On-going   PT LONG TERM GOAL #6   Title Patient negotiates ramps & curbs with rolling  walker & stairs with 1 rail with prosthesis  with supervision.  (Target Date: 10/12/2015)   Time 2   Period Months   Status On-going   PT LONG TERM GOAL #7   Title Patient self reports via FOTO improvement in functional status by > 5 points.  (Target Date: 10/12/2015)   Time 2   Period Months   Status On-going           Plan - 09/26/15 1234    Clinical Impression Statement Pt continues to make steady progress toward goals. On track for discharge at the end of this plan of care.   Pt will benefit from skilled therapeutic intervention in order to improve on the following deficits Abnormal gait;Decreased activity tolerance;Decreased balance;Decreased endurance;Decreased knowledge of use of DME;Decreased mobility;Decreased range of motion;Decreased strength;Impaired flexibility;Prosthetic Dependency;Postural dysfunction;Pain   Rehab Potential Good   Clinical Impairments Affecting Rehab Potential chronic medical conditions with non-compliance    PT Frequency 2x / week   PT Duration Other (comment)  9 weeks   PT Treatment/Interventions ADLs/Self Care Home Management;Moist Heat;DME Instruction;Gait training;Stair training;Functional mobility training;Therapeutic activities;Therapeutic exercise;Balance training;Neuromuscular re-education;Patient/family education;Prosthetic Training;Manual techniques;Passive range of motion   PT Next Visit Plan Gait with rollator for community based activities;continue with strengthening and activity tolerance activities.   Consulted and Agree with Plan of Care Patient        Problem List Patient Active Problem List   Diagnosis Date Noted  . Type 2 diabetes with nephropathy ([place]) 09/07/2015  . Uncontrolled diabetes mellitus with retinopathy, due to underlying condition, without macular edema ([place]) 09/05/2015  . Diabetic retinopathy ([place]) 09/05/2015  . Skin tag 08/08/2015  . Counseling regarding end of life decision making 06/14/2015  . Atherosclerosis of  aorta ([place]) 04/04/2015  . Anemia 10/05/2014  . Chronic diastolic heart failure ([place])   . S/P BKA (below knee amputation) unilateral ([place])   . Tobacco abuse   . Severe obesity (BMI >= 40) ([place]) 03/02/2013  . Abnormality of gait 03/01/2013  . Infective endocarditis 01/22/2013  . Healthcare maintenance 07/10/2012  . Opioid dependence ([place]) 12/03/2011  . PVD in DM 08/27/2011  . GLAUCOMA 11/29/2009  . Essential hypertension 11/29/2009  . Chronic insomnia 10/25/2009  . GASTROESOPHAGEAL REFLUX DISEASE 11/24/2008  . Depression, major, severe recurrence ([place]) 04/06/2008  . DM (diabetes mellitus) type II uncontrolled, periph vascular disorder ([place]) 04/02/2007  . Hyperlipemia 01/08/2007  . COPD (chronic obstructive pulmonary disease) ([place]) 01/08/2007    Willow Ora 09/27/2015, 12:54 PM  Willow Ora, PTA, [place] [address] [place], [place] [address] [phone] 09/27/2015, 12:54 PM

## 2015-09-28 ENCOUNTER — Ambulatory Visit (HOSPITAL_COMMUNITY)
Admission: RE | Admit: 2015-09-28 | Discharge: 2015-09-28 | Disposition: A | Discharge status: Home / Self Care | Payer: Medicare Other | Source: Ambulatory Visit | Attending: Vascular Surgery | Admitting: Vascular Surgery

## 2015-09-28 DIAGNOSIS — I798 Other disorders of arteries, arterioles and capillaries in diseases classified elsewhere: Secondary | ICD-10-CM

## 2015-09-28 DIAGNOSIS — I10 Essential (primary) hypertension: Secondary | ICD-10-CM | POA: Diagnosis not present

## 2015-09-28 DIAGNOSIS — E785 Hyperlipidemia, unspecified: Secondary | ICD-10-CM | POA: Insufficient documentation

## 2015-09-28 DIAGNOSIS — F17200 Nicotine dependence, unspecified, uncomplicated: Secondary | ICD-10-CM | POA: Insufficient documentation

## 2015-09-28 DIAGNOSIS — E1151 Type 2 diabetes mellitus with diabetic peripheral angiopathy without gangrene: Secondary | ICD-10-CM | POA: Insufficient documentation

## 2015-10-01 ENCOUNTER — Ambulatory Visit: Payer: Medicare Other | Admitting: Physical Therapy

## 2015-10-02 ENCOUNTER — Encounter: Payer: Self-pay | Admitting: Vascular Surgery

## 2015-10-03 ENCOUNTER — Encounter: Payer: Self-pay | Admitting: Physical Therapy

## 2015-10-03 ENCOUNTER — Ambulatory Visit: Payer: Medicare Other | Admitting: Physical Therapy

## 2015-10-03 DIAGNOSIS — R6889 Other general symptoms and signs: Secondary | ICD-10-CM

## 2015-10-03 DIAGNOSIS — M545 Low back pain: Secondary | ICD-10-CM

## 2015-10-03 DIAGNOSIS — R269 Unspecified abnormalities of gait and mobility: Secondary | ICD-10-CM | POA: Diagnosis not present

## 2015-10-03 DIAGNOSIS — Z89512 Acquired absence of left leg below knee: Secondary | ICD-10-CM | POA: Diagnosis not present

## 2015-10-03 DIAGNOSIS — R531 Weakness: Secondary | ICD-10-CM

## 2015-10-03 DIAGNOSIS — M792 Neuralgia and neuritis, unspecified: Secondary | ICD-10-CM | POA: Diagnosis not present

## 2015-10-04 ENCOUNTER — Ambulatory Visit (INDEPENDENT_AMBULATORY_CARE_PROVIDER_SITE_OTHER): Payer: Medicare Other | Admitting: Vascular Surgery

## 2015-10-04 ENCOUNTER — Encounter: Payer: Self-pay | Admitting: Vascular Surgery

## 2015-10-04 VITALS — BP 160/80 | HR 57 | Ht 66.0 in | Wt 270.0 lb

## 2015-10-04 DIAGNOSIS — M79604 Pain in right leg: Secondary | ICD-10-CM

## 2015-10-04 NOTE — Progress Notes (Signed)
[facility] [place] HISTORY AND PHYSICAL   History of Present Illness:  Patient is a 54 y.o. female who presents for evaluation of right leg pain. The patient states she develops pain that starts in her right hip and radiates all the way down to her foot. She states this gets worse when she lies flat. She states it improved somewhat with dangling the leg down. 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 She has previously had a right common iliac stent by me in 2008. She previously had a left common iliac stent by interventional radiology in 2005. She has had no other interventions. She did have a left below-knee amputation by Dr. Sharol Given in 2014. She was seen by my partner Dr. Kellie Simmering at that time and found to have normal ABIs with a palpable pulse. She does not really walk enough to elicit claudication symptoms.  Other medical problems include diabetes, hyperlipidemia, hypertension, tobacco abuse, COPD, peripheral neuropathy, congestive heart failure, chronic pain all of which are currently stable.  Greater than 3 minutes today spent regarding smoking cessation counseling.  Past Medical History  Diagnosis Date  . Depression   . GERD (gastroesophageal reflux disease)   . Hyperlipidemia   . Hypertension   . Glaucoma   . PVD (peripheral vascular disease) with claudication ([place])     Stents to bilateral common iliac arteries (left 2005, right 2008), on chronic plavix  . Hyperplastic colon polyp 12/2010    Per colonoscopy (12/2010) - Dr. Deatra Ina  . Asthma   . COPD 01/08/2007    PFT's 05/2007 : FEV1/FVC 82, FEV1 64% pred, FEF 25-75% 40% predicted, 16% improvement in FEV1 with bronchodilators.     . Tobacco abuse   . Chronic osteomyelitis of foot (HCC)     chronic, right secondary to diabetic foot ulcers  . Polymicrobial bacterial infection 01/2013    GBS and S. aureus bacteremia // Source likely infected diabetic foot ulcer  . Infective  endocarditis 01/2013    TEE 2/14 : Endocarditis involving mitral and tricuspid valves. Blood cultures 01/26/13 S. Aureus and GBS. Blood cultures Feb 6th, 8th, and 9th and March were negative.Repeat TEE 3/20 negative for vegitations  . 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 Chronic diastolic heart failure (HCC)     grade 2 per 2D echocardiogram (01/2013)  . Ulcer of foot, chronic (HCC)     Left. No OM per MRI (01/2013)  . DVT of upper extremity (deep vein thrombosis) ([place]) 03/11/2013    Secondary to PICC line. Right brachial vein, diagnosed on 03/10/2013 Coumadin for 3 months. End date 06/10/2013   . Chronic pain syndrome 12/03/2011    Likely secondary to depression, "fibromyalgia", neuropathy, and obesity. Lumbar MRI 2014 no sig change from prior (2008) : Stable hypertrophic facet disease most notable at L4-5. Stable shallow left foraminal/extraforaminal disc protrusion at L4-5. No direct neural compression.      . Environmental allergies     Hx: of  . Fatty liver 2003    observed on ultrasound abdomen  . CHF (congestive heart failure) ([place])   . Anginal pain [facility])     '3' of 10 ischemia ruled out 9/9   . Fibromyalgia   . Chronic bronchitis ([place])     "I get it alot" (09/28/2013)  . Exertional shortness of breath   . Rheumatoid arthritis ([place])   . Arthritis of lumbar spine   . Chronic  lower back pain   . Diabetic peripheral neuropathy ([place])   . Type II diabetes mellitus with peripheral circulatory disorders, uncontrolled DX: 1993    Insulin dep. Poor control. Complicated by diabetic foot ulcer and diabetic eye disease.    . 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 2/2 tobacco use, DM, and cont weight bearing on surgical wound and developed gangrene     Past Surgical History  Procedure Laterality Date  . Abdominal hysterectomy  1997    secondary to uterine fibroids  . Tubal ligation    . Shoulder  arthroscopy w/ rotator cuff repair Bilateral   . Breast biopsy      multiple-benign per pt  . Wrist surgery Right     "for tumors" (09/28/2013)  . Ganglion cyst excision      multiple  . Other surgical history      stents in lower ext  . Tee without cardioversion N/A 01/31/2013    Procedure: TRANSESOPHAGEAL ECHOCARDIOGRAM (TEE);  Surgeon: Fay Records, MD;  Location: [place];  Service: Cardiovascular;  Laterality: N/A;  Rm [phone]  . Bladder surgery      bladder reconstruction surgery  . Tee without cardioversion N/A 03/10/2013    Procedure: TRANSESOPHAGEAL ECHOCARDIOGRAM (TEE);  Surgeon: Larey Dresser, MD;  Location: [place];  Service: Cardiovascular;  Laterality: N/A;  Rm. [phone]  . Skin split graft Bilateral 05/13/2013    Procedure: Right and Left Foot Allograft Skin Graft;  Surgeon: Newt Minion, MD;  Location: [place];  Service: Orthopedics;  Laterality: Bilateral;  Right and Left Foot Allograft Skin Graft  . Toe amputation Left 08/31/2013    4TH & 5 TH TOE   . Amputation Left 08/31/2013    Procedure: AMPUTATION RAY;  Surgeon: Newt Minion, MD;  Location: [place];  Service: Orthopedics;  Laterality: Left;  Left Foot 5th Ray Amputation  . Esophagogastroduodenoscopy N/A 09/20/2013    Procedure: ESOPHAGOGASTRODUODENOSCOPY (EGD);  Surgeon: Jerene Bears, MD;  Location: [place];  Service: Gastroenterology;  Laterality: N/A;  . Foot amputation through metatarsal Left 09/28/2013  . Tonsillectomy    . Amputation Left 09/28/2013    Procedure: Left Midfoot amputation;  Surgeon: Newt Minion, MD;  Location: [place];  Service: Orthopedics;  Laterality: Left;  Left Midfoot amputation  . Amputation Left 10/14/2013    Procedure: AMPUTATION BELOW KNEE- left;  Surgeon: Newt Minion, MD;  Location: [place];  Service: Orthopedics;  Laterality: Left;  Left Below Knee Amputation     Social History Social History  Substance Use Topics  . Smoking status: Current Every Day Smoker -- 1.00 packs/day for  44 years    Types: Cigarettes    Last Attempt to Quit: 10/03/2013  . Smokeless tobacco: Never Used     Comment: Cutting back.  . Alcohol Use: No    Family History Family History  Problem Relation Age of Onset  . Diverticulosis Mother   . Diabetes Mother   . Hypertension Mother   . Congestive Heart Failure Mother   . Asthma Father   . CAD Sister 41    MI at age 58 per patient.  However, she has not had a stent or CABG.   . Heart disease Sister     before age 36    Allergies  Allergies  Allergen Reactions  . Iohexol      Desc: IV CONTRAST CAUSE NEPHROPATHY IN  2007   . Ivp Dye [Iodinated Diagnostic Agents]   . Morphine Sulfate Itching and Rash     Current Outpatient Prescriptions  Medication Sig Dispense Refill  . ADVAIR DISKUS 250-50 MCG/DOSE AEPB TAKE 1 INHALATION BY MOUTH TWICE DAILY 180 each 3  . albuterol (PROVENTIL) (2.5 MG/3ML) 0.083% nebulizer solution INHALE THE CONTENTS OF 1 VIAL VIA NEBULIZER EVERY 6 HOURS AS NEEDED FOR WHEEZING 360 mL 2  . amLODipine (NORVASC) 10 MG tablet Take 0.5 tablets (5 mg total) by mouth daily. 30 tablet 11  . atenolol (TENORMIN) 100 MG tablet Take 1 tablet (100 mg total) by mouth daily. 90 tablet 3  . atorvastatin (LIPITOR) 80 MG tablet Take 1 tablet (80 mg total) by mouth daily. 90 tablet 3  . baclofen (LIORESAL) 10 MG tablet TAKE 1 TABLET BY MOUTH THREE TIMES A DAY AS NEEDED 90 each 2  . benazepril (LOTENSIN) 40 MG tablet TAKE 1 TABLET BY MOUTH DAILY 90 tablet 3  . clotrimazole-betamethasone (LOTRISONE) cream Apply to affected area under breasts 2 times daily 15 g 0  . diphenhydrAMINE (BENADRYL) 25 MG tablet Take 25 mg by mouth every 6 (six) hours.     Marland Kitchen EASY TOUCH PEN NEEDLES 31G X 8 MM MISC USE TO INJECT INSULIN TWICE DAILY 100 each 10  . fluticasone (FLONASE) 50 MCG/ACT nasal spray PLACE 1 SPRAY INTO BOTH NOSTRILS DAILY 48 g 11  . furosemide (LASIX) 80 MG tablet TAKE 1 TABLET BY MOUTH EVERY DAY 30 tablet 5  . HUMALOG MIX 75/25  KWIKPEN (75-25) 100 UNIT/ML Kwikpen INJECT 135 UNITS SUBCUTANEOUSLY IN THE MORNING BEFORE BREAKFAST & INJECT 70 UNITS SUBCUTANEOUSLY IN THE EVENING BEFORE EVENING MEAL 75 mL 11  . ipratropium (ATROVENT) 0.02 % nebulizer solution USE 1 VIAL VIA NEBULIZER EVERY 6 HOURS AS NEEDED FOR WHEEZING 75 mL 2  . Neomycin-Bacitracin-Polymyxin (NEOSPORIN ORIGINAL) 3.5-2094613826 OINT Apply to spot on neck twice a day 14.2 g 0  . nystatin (MYCOSTATIN) powder APPLY TOPICALLY THREE TIMES A DAY 60 g 1  . ONETOUCH VERIO test strip USE 3-4 TIMES DAILY TO CHECK BLOOD SUGAR 100 each 11  . oxyCODONE-acetaminophen (PERCOCET) 10-325 MG per tablet Take up to 5 pills a day, at least 4 hours apart. 150 tablet 0  . potassium chloride SA (K-DUR,KLOR-CON) 20 MEQ tablet TAKE 2 TABLETS BY MOUTH DAILY 180 tablet 0  . pregabalin (LYRICA) 200 MG capsule Take 1 capsule (200 mg total) by mouth 3 (three) times daily. 90 capsule 3  . PROAIR HFA 108 (90 BASE) MCG/ACT inhaler INHALE 2 PUFFS BY MOUTH EVERY 6 HOURS AS NEEDED FOR WHEEZING 8.5 g 2  . ranitidine (ZANTAC) 150 MG tablet Take 1 tablet (150 mg total) by mouth 2 (two) times daily. 180 tablet 3  . rosuvastatin (CRESTOR) 20 MG tablet Take 1 tablet (20 mg total) by mouth at bedtime. 30 tablet 11  . sertraline (ZOLOFT) 100 MG tablet Take 1 tablet (100 mg total) by mouth daily. 30 tablet 3  . travoprost, benzalkonium, (TRAVATAN) 0.004 % ophthalmic solution Place 1 drop into both eyes at bedtime.     . traZODone (DESYREL) 100 MG tablet TAKE 1 TABLET BY MOUTH DAILY AT BEDTIME AS NEEDED FOR SLEEP 90 tablet 3  . ONETOUCH DELICA LANCETS 40J MISC USE TO CHECK BLOOD SUGARS FOUR TIMES A DAY (Patient not taking: Reported on 10/04/2015) 100 each 11  . [DISCONTINUED] Potassium Chloride ER 20 MEQ TBCR Take 40 mEq by mouth daily. 30 tablet 0   No  current facility-administered medications for this visit.    ROS:   General:  No weight loss, Fever, chills  HEENT: No recent headaches, no nasal  bleeding, no visual changes, no sore throat  Neurologic: No dizziness, blackouts, seizures. No recent symptoms of stroke or mini- stroke. No recent episodes of slurred speech, or temporary blindness.  Cardiac: No recent episodes of chest pain/pressure, no shortness of breath at rest.  No shortness of breath with exertion.  Denies history of atrial fibrillation or irregular heartbeat  Vascular: No history of rest pain in feet.  No history of claudication.  No history of non-healing ulcer, No history of DVT   Pulmonary: No home oxygen, no productive cough, no hemoptysis,  No asthma or wheezing  Musculoskeletal:  [ x] Arthritis, [ x] Low back pain,  [x ] Joint pain  Hematologic:No history of hypercoagulable state.  No history of easy bleeding.  No history of anemia  Gastrointestinal: No hematochezia or melena,  No gastroesophageal reflux, no trouble swallowing  Urinary: [ ]  chronic Kidney disease, [ ]  on HD - [ ]  MWF or [ ]  TTHS, [ ]  Burning with urination, [ ]  Frequent urination, [ ]  Difficulty urinating;   Skin: No rashes  Psychological: No history of anxiety,  + history of depression   Physical Examination  Filed Vitals:   10/04/15 0916 10/04/15 0924  BP: 162/79 160/80  Pulse: 57   Height: 5\' 6"  (1.676 m)   Weight: 270 lb (122.471 kg)   SpO2: 90%     Body mass index is 43.6 kg/(m^2).  General:  Alert and oriented, no acute distress HEENT: Normal Neck: No bruit or JVD Pulmonary: Clear to auscultation bilaterally Cardiac: Regular Rate and Rhythm without murmur Abdomen: Soft, non-tender, non-distended, no mass, obese  Skin: No rash Extremity Pulses:  2+ femoral, dorsalis pedis pulse, absent posterior tibial pulse right leg Musculoskeletal: No deformity or edema  Neurologic: Upper and lower extremity motor 5/5 and symmetric  DATA:  Patient had right leg ABI performed on October 7.  I reviewed that study today. This showed an ABI on the right side 1.03 with no significant  change since 2014. She had normal triphasic waveforms in the anterior tibial and biphasic waveforms in the posterior tibial   ASSESSMENT:  Patient with right leg pain. She has a palpable pulse in her right foot with normal ABIs. Without the etiology of this is arterial in nature. She has easily palpable femoral pulses suggesting that her iliac stents are patent.   PLAN:  Patient was advised to continue follow-up with her primary care physician regarding her pain in her right leg. This could certainly represent musculoskeletal pain or degenerative arthritis pain especially in light of the fact that she has to walk on a prosthetic leg. She will follow-up with me on an as-needed basis.  Ruta Hinds, MD [facility] [place] Office: [phone] Pager: [phone]

## 2015-10-04 NOTE — Therapy (Signed)
[facility] [address] [place], [place], [address] Phone: [phone]   Fax:  [phone]  Physical Therapy Treatment  Patient Details  Name: Jennifer [name] MRN: [id number] Date of Birth: 19-May-1961 Referring Provider:  Bartholomew Crews, MD  Encounter Date: 10/03/2015      PT End of Session - 10/03/15 1246    Visit Number 14   Number of Visits 18   Date for PT Re-Evaluation 10/12/15   Authorization Type Medicare G-Code 10th visit   PT Start Time 1244  pt late due to transportation   PT Stop Time 1315   PT Time Calculation (min) 31 min   Equipment Utilized During Treatment Gait belt   Activity Tolerance Patient tolerated treatment well   Behavior During Therapy [facility] for tasks assessed/performed      Past Medical History  Diagnosis Date  . Depression   . GERD (gastroesophageal reflux disease)   . Hyperlipidemia   . Hypertension   . Glaucoma   . PVD (peripheral vascular disease) with claudication ([place])     Stents to bilateral common iliac arteries (left 2005, right 2008), on chronic plavix  . Hyperplastic colon polyp 12/2010    Per colonoscopy (12/2010) - Dr. Deatra Ina  . Asthma   . COPD 01/08/2007    PFT's 05/2007 : FEV1/FVC 82, FEV1 64% pred, FEF 25-75% 40% predicted, 16% improvement in FEV1 with bronchodilators.     . Tobacco abuse   . Chronic osteomyelitis of foot (HCC)     chronic, right secondary to diabetic foot ulcers  . Polymicrobial bacterial infection 01/2013    GBS and S. aureus bacteremia // Source likely infected diabetic foot ulcer  . 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 Blood cultures 01/26/13 S. Aureus and GBS. Blood cultures Feb 6th, 8th, and 9th and March were negative.Repeat TEE 3/20 negative for vegitations  . 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 Chronic diastolic heart failure (HCC)     grade 2 per 2D  echocardiogram (01/2013)  . Ulcer of foot, chronic (HCC)     Left. No OM per MRI (01/2013)  . DVT of upper extremity (deep vein thrombosis) ([place]) 03/11/2013    Secondary to PICC line. Right brachial vein, diagnosed on 03/10/2013 Coumadin for 3 months. End date 06/10/2013   . Chronic pain syndrome 12/03/2011    Likely secondary to depression, "fibromyalgia", neuropathy, and obesity. Lumbar MRI 2014 no sig change from prior (2008) : Stable hypertrophic facet disease most notable at L4-5. Stable shallow left foraminal/extraforaminal disc protrusion at L4-5. No direct neural compression.      . Environmental allergies     Hx: of  . Fatty liver 2003    observed on ultrasound abdomen  . CHF (congestive heart failure) ([place])   . Anginal pain [facility])     '3' of 10 ischemia ruled out 9/9   . Fibromyalgia   . Chronic bronchitis ([place])     "I get it alot" (09/28/2013)  . Exertional shortness of breath   . Rheumatoid arthritis ([place])   . Arthritis of lumbar spine   . Chronic lower back pain   . Diabetic peripheral neuropathy ([place])   . Type II diabetes mellitus with peripheral circulatory disorders, uncontrolled DX: 1993    Insulin dep. Poor control. Complicated by diabetic foot ulcer and diabetic eye disease.    . 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 2/2 tobacco use, DM, and cont weight bearing on surgical wound and developed gangrene     Past Surgical History  Procedure Laterality Date  . Abdominal hysterectomy  1997    secondary to uterine fibroids  . Tubal ligation    . Shoulder arthroscopy w/ rotator cuff repair Bilateral   . Breast biopsy      multiple-benign per pt  . Wrist surgery Right     "for tumors" (09/28/2013)  . Ganglion cyst excision      multiple  . Other surgical history      stents in lower ext  . Tee without cardioversion N/A 01/31/2013    Procedure: TRANSESOPHAGEAL ECHOCARDIOGRAM (TEE);  Surgeon: Fay Records, MD;  Location: [place];  Service: Cardiovascular;  Laterality: N/A;  Rm [phone]  . Bladder surgery      bladder reconstruction surgery  . Tee without cardioversion N/A 03/10/2013    Procedure: TRANSESOPHAGEAL ECHOCARDIOGRAM (TEE);  Surgeon: Larey Dresser, MD;  Location: [place];  Service: Cardiovascular;  Laterality: N/A;  Rm. [id number]  . Skin split graft Bilateral 05/13/2013    Procedure: Right and Left Foot Allograft Skin Graft;  Surgeon: Newt Minion, MD;  Location: [place];  Service: Orthopedics;  Laterality: Bilateral;  Right and Left Foot Allograft Skin Graft  . Toe amputation Left 08/31/2013    4TH & 5 TH TOE   . Amputation Left 08/31/2013    Procedure: AMPUTATION RAY;  Surgeon: Newt Minion, MD;  Location: [place];  Service: Orthopedics;  Laterality: Left;  Left Foot 5th Ray Amputation  . Esophagogastroduodenoscopy N/A 09/20/2013    Procedure: ESOPHAGOGASTRODUODENOSCOPY (EGD);  Surgeon: Jerene Bears, MD;  Location: [place];  Service: Gastroenterology;  Laterality: N/A;  . Foot amputation through metatarsal Left 09/28/2013  . Tonsillectomy    . Amputation Left 09/28/2013    Procedure: Left Midfoot amputation;  Surgeon: Newt Minion, MD;  Location: [place];  Service: Orthopedics;  Laterality: Left;  Left Midfoot amputation  . Amputation Left 10/14/2013    Procedure: AMPUTATION BELOW KNEE- left;  Surgeon: Newt Minion, MD;  Location: [place];  Service: Orthopedics;  Laterality: Left;  Left Below Knee Amputation     There were no vitals filed for this visit.  Visit Diagnosis:  Abnormality of gait  Activity intolerance  Weakness generalized  Decreased functional activity tolerance  Status post below knee amputation of left lower extremity (HCC)  Midline low back pain, with sciatica presence unspecified  Pain, neuropathic      Subjective Assessment - 10/03/15 1245    Subjective Feeling better today. Had a stomach virus. No falls. See's Dr Oneida Alar tomorrow to get  results of right leg dopplar studies.   Currently in Pain? Yes   Pain Score 8    Pain Location Generalized   Pain Orientation Other (Comment)  hands and feet   Pain Descriptors / Indicators Aching;Pins and needles;Tingling;Sore   Pain Type Chronic pain   Pain Onset More than a month ago   Pain Frequency Constant   Aggravating Factors  rainy weather, increased activity   Pain Relieving Factors rest, medication            [facility] Adult PT Treatment/Exercise - 10/03/15 1247    Transfers   Sit to Stand 5: Supervision;With upper extremity assist;With armrests;From chair/3-in-1   Sit to Stand Details Visual cues for safe use of DME/AE  Stand to Sit 5: Supervision;With upper extremity assist;With armrests;To chair/3-in-1   Stand to Sit Details (indicate cue type and reason) Visual cues for safe use of DME/AE   Ambulation/Gait   Ambulation/Gait Yes   Ambulation/Gait Assistance 5: Supervision;4: Min guard   Ambulation/Gait Assistance Details cues on posture and rollator proximity with gait. frequent rest breaks needed on 1st gait outdoors.   Ambulation Distance (Feet) 325 Feet  x1 outdoors;135 x1 and 90 x1 indoors level floors   Assistive device Prosthesis;Rollator   Gait Pattern Step-to pattern;Decreased step length - right;Decreased stance time - left;Decreased stride length;Decreased hip/knee flexion - right;Decreased weight shift to right;Right hip hike;Trunk flexed;Abducted- right;Poor foot clearance - right   Ambulation Surface Level;Unlevel;Indoor;Outdoor;Paved   Ramp 5: Supervision  outdoor's with rollator/prosthesis   Ramp Details (indicate cue type and reason) cues on technique and brake use   Curb 5: Supervision  outdoor curb x1 with rollator/prosthesis            PT Short Term Goals - 09/12/15 1302    PT SHORT TERM GOAL #1   Title Patient demonstrates understanding of initail HEP with minimal cues. (Target Date: 09/12/2015)   Baseline 09/12/15: pt independent with  OTAGO program   Status Achieved   PT SHORT TERM GOAL #2   Title patient verbalizes understanding of fall prevention strategies in home. (Target Date: 09/12/2015)   Baseline 09/12/15: pt given strategies on 08/28/15, able to recount most of them today   Status Partially Met   PT SHORT TERM GOAL #3   Title patient verbalizes understanding proper activity modification to assist with pain management. (Target Date: 09/12/2015)   Baseline 09/12/15: pt verbalizes she knows she needs to exercise, not all at once, daily for best results, however not actually doing this on daily basis   Status Achieved           PT Long Term Goals - 08/31/15 1500    PT LONG TERM GOAL #1   Title Patient verbalizes & demonstrates understanding of ongoing HEP / fitness plan. (Target Date: 10/12/2015)   Time 2   Period Months   Status On-going   PT LONG TERM GOAL #2   Title Patient demonstrates / verbalizes managing activity level to manage pain. (Target Date: 10/12/2015)   Time 2   Period Months   Status On-going   PT LONG TERM GOAL #3   Title Patient ambulates 250' with rolling walker or rollator walker & prosthesis modified independent.  (Target Date: 10/12/2015)   Time 2   Period Months   Status On-going   PT LONG TERM GOAL #4   Title Timed Up & Go with rolling walker & prosthesis <20 seconds safely to decrease risk of falls.  (Target Date: 10/12/2015)   Time 2   Period Months   Status On-going   PT LONG TERM GOAL #5   Title Berg Balance >20 / 56  (Target Date: 10/12/2015)   Time 2   Period Months   Status On-going   PT LONG TERM GOAL #6   Title Patient negotiates ramps & curbs with rolling walker & stairs with 1 rail with prosthesis with supervision.  (Target Date: 10/12/2015)   Time 2   Period Months   Status On-going   PT LONG TERM GOAL #7   Title Patient self reports via FOTO improvement in functional status by > 5 points.  (Target Date: 10/12/2015)   Time 2   Period Months   Status On-going  Plan - 10/03/15 1246    Clinical Impression Statement Pt safe with use of rollator on uneven outdoor surfaces.    Pt will benefit from skilled therapeutic intervention in order to improve on the following deficits Abnormal gait;Decreased activity tolerance;Decreased balance;Decreased endurance;Decreased knowledge of use of DME;Decreased mobility;Decreased range of motion;Decreased strength;Impaired flexibility;Prosthetic Dependency;Postural dysfunction;Pain   Rehab Potential Good   Clinical Impairments Affecting Rehab Potential chronic medical conditions with non-compliance    PT Frequency 2x / week   PT Duration Other (comment)  9 weeks   PT Treatment/Interventions ADLs/Self Care Home Management;Moist Heat;DME Instruction;Gait training;Stair training;Functional mobility training;Therapeutic activities;Therapeutic exercise;Balance training;Neuromuscular re-education;Patient/family education;Prosthetic Training;Manual techniques;Passive range of motion   PT Next Visit Plan Gait with rollator for community based activities;continue with strengthening and activity tolerance activities.   Consulted and Agree with Plan of Care Patient        Problem List Patient Active Problem List   Diagnosis Date Noted  . Type 2 diabetes with nephropathy ([place]) 09/07/2015  . Uncontrolled diabetes mellitus with retinopathy, due to underlying condition, without macular edema ([place]) 09/05/2015  . Diabetic retinopathy ([place]) 09/05/2015  . Skin tag 08/08/2015  . Counseling regarding end of life decision making 06/14/2015  . Atherosclerosis of aorta ([place]) 04/04/2015  . Anemia 10/05/2014  . Chronic diastolic heart failure ([place])   . S/P BKA (below knee amputation) unilateral ([place])   . Tobacco abuse   . Severe obesity (BMI >= 40) ([place]) 03/02/2013  . Abnormality of gait 03/01/2013  . Infective endocarditis 01/22/2013  . Healthcare maintenance 07/10/2012  . Opioid dependence ([place]) 12/03/2011  . PVD in  DM 08/27/2011  . GLAUCOMA 11/29/2009  . Essential hypertension 11/29/2009  . Chronic insomnia 10/25/2009  . GASTROESOPHAGEAL REFLUX DISEASE 11/24/2008  . Depression, major, severe recurrence ([place]) 04/06/2008  . DM (diabetes mellitus) type II uncontrolled, periph vascular disorder ([place]) 04/02/2007  . Hyperlipemia 01/08/2007  . COPD (chronic obstructive pulmonary disease) ([place]) 01/08/2007    Willow Ora 10/04/2015, 1:46 PM  Willow Ora, PTA, [facility] [address] [place], [place] [address] [phone] 10/04/2015, 1:46 PM

## 2015-10-05 ENCOUNTER — Telehealth: Payer: Self-pay | Admitting: Dietician

## 2015-10-05 ENCOUNTER — Ambulatory Visit: Payer: Medicare Other | Admitting: Dietician

## 2015-10-05 ENCOUNTER — Other Ambulatory Visit: Payer: Self-pay | Admitting: Internal Medicine

## 2015-10-05 NOTE — Telephone Encounter (Signed)
Benazepril filled one yr in Feb Lyrica filled one yr in June

## 2015-10-05 NOTE — Telephone Encounter (Signed)
Patient returned call to talk on phone rather than her appointment today:   Health Coaching Notes  Start : 8864 PM  End time: 1602  Patient Identified Concern:  Feeling that her depression is controlled she rates a  3/10, but not suicidal, wants a fixable reason for her pain. Stage of Change Patient Is In:  Contemplative to preparation Patient Reported Barriers:  Finances, pain, living situation Patient Reported Perceived Benefits:  getting her "old self back", more independence and being able to care for her self Patient Reports Self-Efficacy:   Low  Behavior Change Supports:  Her father Goals:  Get her walker brakes fixed Patient Education:  none today  Follow Up: appointment scheduled for 10-11-15

## 2015-10-05 NOTE — Telephone Encounter (Signed)
Pt said too $. I sent in neomycin instead but need to cream should be over by now

## 2015-10-05 NOTE — Telephone Encounter (Signed)
Patient left a message that she cannot make her appointment today because she did not notify her transportation and does not have the money to pay them. She asked if we could have a phone visit at 230 instead.  Patient was called at 14:40 and she had a visitor. She agreed to call back before 1630 today for a phone visit.

## 2015-10-08 ENCOUNTER — Ambulatory Visit: Payer: Medicare Other | Admitting: Physical Therapy

## 2015-10-08 ENCOUNTER — Encounter: Payer: Self-pay | Admitting: Physical Therapy

## 2015-10-08 DIAGNOSIS — Z89512 Acquired absence of left leg below knee: Secondary | ICD-10-CM | POA: Diagnosis not present

## 2015-10-08 DIAGNOSIS — M545 Low back pain: Secondary | ICD-10-CM

## 2015-10-08 DIAGNOSIS — M792 Neuralgia and neuritis, unspecified: Secondary | ICD-10-CM | POA: Diagnosis not present

## 2015-10-08 DIAGNOSIS — R6889 Other general symptoms and signs: Secondary | ICD-10-CM

## 2015-10-08 DIAGNOSIS — R269 Unspecified abnormalities of gait and mobility: Secondary | ICD-10-CM | POA: Diagnosis not present

## 2015-10-08 DIAGNOSIS — R531 Weakness: Secondary | ICD-10-CM

## 2015-10-08 NOTE — Therapy (Signed)
[facility] [address] [place], [place], [address] Phone: [phone]   Fax:  [phone]  Physical Therapy Treatment  Patient Details  Name: Jennifer [name] MRN: [id number] Date of Birth: 1961-01-23 No Data Recorded  Encounter Date: 10/08/2015      PT End of Session - 10/08/15 1230    Visit Number 15   Number of Visits 18   Date for PT Re-Evaluation 10/12/15   Authorization Type Medicare G-Code 10th visit   PT Start Time 1232   PT Stop Time 1315   PT Time Calculation (min) 43 min   Equipment Utilized During Treatment Gait belt   Activity Tolerance Patient tolerated treatment well   Behavior During Therapy [facility] for tasks assessed/performed      Past Medical History  Diagnosis Date  . Depression   . GERD (gastroesophageal reflux disease)   . Hyperlipidemia   . Hypertension   . Glaucoma   . PVD (peripheral vascular disease) with claudication ([place])     Stents to bilateral common iliac arteries (left 2005, right 2008), on chronic plavix  . Hyperplastic colon polyp 12/2010    Per colonoscopy (12/2010) - Dr. Deatra Ina  . Asthma   . COPD 01/08/2007    PFT's 05/2007 : FEV1/FVC 82, FEV1 64% pred, FEF 25-75% 40% predicted, 16% improvement in FEV1 with bronchodilators.     . Tobacco abuse   . Chronic osteomyelitis of foot (HCC)     chronic, right secondary to diabetic foot ulcers  . Polymicrobial bacterial infection 01/2013    GBS and S. aureus bacteremia // Source likely infected diabetic foot ulcer  . 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 Blood cultures 01/26/13 S. Aureus and GBS. Blood cultures Feb 6th, 8th, and 9th and March were negative.Repeat TEE 3/20 negative for vegitations  . 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 Chronic diastolic heart failure (HCC)     grade 2 per 2D echocardiogram (01/2013)  . Ulcer of foot, chronic (HCC)    Left. No OM per MRI (01/2013)  . DVT of upper extremity (deep vein thrombosis) ([place]) 03/11/2013    Secondary to PICC line. Right brachial vein, diagnosed on 03/10/2013 Coumadin for 3 months. End date 06/10/2013   . Chronic pain syndrome 12/03/2011    Likely secondary to depression, "fibromyalgia", neuropathy, and obesity. Lumbar MRI 2014 no sig change from prior (2008) : Stable hypertrophic facet disease most notable at L4-5. Stable shallow left foraminal/extraforaminal disc protrusion at L4-5. No direct neural compression.      . Environmental allergies     Hx: of  . Fatty liver 2003    observed on ultrasound abdomen  . CHF (congestive heart failure) ([place])   . Anginal pain [facility])     '3' of 10 ischemia ruled out 9/9   . Fibromyalgia   . Chronic bronchitis ([place])     "I get it alot" (09/28/2013)  . Exertional shortness of breath   . Rheumatoid arthritis ([place])   . Arthritis of lumbar spine   . Chronic lower back pain   . Diabetic peripheral neuropathy ([place])   . Type II diabetes mellitus with peripheral circulatory disorders, uncontrolled DX: 1993    Insulin dep. Poor control. Complicated by diabetic foot ulcer and diabetic eye disease.    . 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 2/2 tobacco use, DM, and cont weight bearing on surgical wound and developed gangrene     Past Surgical History  Procedure Laterality Date  . Abdominal hysterectomy  1997    secondary to uterine fibroids  . Tubal ligation    . Shoulder arthroscopy w/ rotator cuff repair Bilateral   . Breast biopsy      multiple-benign per pt  . Wrist surgery Right     "for tumors" (09/28/2013)  . Ganglion cyst excision      multiple  . Other surgical history      stents in lower ext  . Tee without cardioversion N/A 01/31/2013    Procedure: TRANSESOPHAGEAL ECHOCARDIOGRAM (TEE);  Surgeon: Fay Records, MD;  Location: [facility];  Service: Cardiovascular;   Laterality: N/A;  Rm [phone]  . Bladder surgery      bladder reconstruction surgery  . Tee without cardioversion N/A 03/10/2013    Procedure: TRANSESOPHAGEAL ECHOCARDIOGRAM (TEE);  Surgeon: Larey Dresser, MD;  Location: [facility];  Service: Cardiovascular;  Laterality: N/A;  Rm. [phone]  . Skin split graft Bilateral 05/13/2013    Procedure: Right and Left Foot Allograft Skin Graft;  Surgeon: Newt Minion, MD;  Location: [facility];  Service: Orthopedics;  Laterality: Bilateral;  Right and Left Foot Allograft Skin Graft  . Toe amputation Left 08/31/2013    4TH & 5 TH TOE   . Amputation Left 08/31/2013    Procedure: AMPUTATION RAY;  Surgeon: Newt Minion, MD;  Location: [facility];  Service: Orthopedics;  Laterality: Left;  Left Foot 5th Ray Amputation  . Esophagogastroduodenoscopy N/A 09/20/2013    Procedure: ESOPHAGOGASTRODUODENOSCOPY (EGD);  Surgeon: Jerene Bears, MD;  Location: [facility];  Service: Gastroenterology;  Laterality: N/A;  . Foot amputation through metatarsal Left 09/28/2013  . Tonsillectomy    . Amputation Left 09/28/2013    Procedure: Left Midfoot amputation;  Surgeon: Newt Minion, MD;  Location: [facility];  Service: Orthopedics;  Laterality: Left;  Left Midfoot amputation  . Amputation Left 10/14/2013    Procedure: AMPUTATION BELOW KNEE- left;  Surgeon: Newt Minion, MD;  Location: [facility];  Service: Orthopedics;  Laterality: Left;  Left Below Knee Amputation     There were no vitals filed for this visit.  Visit Diagnosis:  Abnormality of gait  Activity intolerance  Weakness generalized  Decreased functional activity tolerance  Status post below knee amputation of left lower extremity (HCC)  Midline low back pain, with sciatica presence unspecified  Pain, neuropathic      Subjective Assessment - 10/08/15 1231    Subjective No falls. Dr. Oneida Alar said her circulation is not primary cause of LE pain but probably arthritis.    Currently in Pain? Yes   Pain Score 6    Pain  Location Other (Comment)  back & right leg   Pain Descriptors / Indicators Aching;Pins and needles;Tingling;Sore   Pain Type Chronic pain   Pain Onset More than a month ago   Pain Frequency Constant   Aggravating Factors  rainy weather, overdoing activities   Pain Relieving Factors rest, medication   Multiple Pain Sites No            [facility] PT Assessment - 10/08/15 1230    Berg Balance Test   Sit to Stand Able to stand  independently using hands   Standing Unsupported Able to stand 2 minutes with supervision   Sitting with Back Unsupported but Feet Supported on Floor or Stool Able  to sit safely and securely 2 minutes   Stand to Sit Controls descent by using hands   Transfers Able to transfer safely, minor use of hands   Standing Unsupported with Eyes Closed Able to stand 10 seconds with supervision   Standing Ubsupported with Feet Together Able to place feet together independently and stand for 1 minute with supervision   From Standing, Reach Forward with Outstretched Arm Can reach forward >12 cm safely (5")   From Standing Position, Pick up Object from Floor Able to pick up shoe, needs supervision   From Standing Position, Turn to Look Behind Over each Shoulder Turn sideways only but maintains balance   Turn 360 Degrees Needs assistance while turning   Standing Unsupported, Alternately Place Feet on Step/Stool Needs assistance to keep from falling or unable to try   Standing Unsupported, One Foot in Front Able to take small step independently and hold 30 seconds   Standing on One Leg Tries to lift leg/unable to hold 3 seconds but remains standing independently   Total Score 34                     [facility] Adult PT Treatment/Exercise - 10/08/15 1230    Transfers   Sit to Stand 6: Modified independent (Device/Increase time);With upper extremity assist;With armrests;From chair/3-in-1   Sit to Stand Details --   Stand to Sit 6: Modified independent (Device/Increase  time);With upper extremity assist;With armrests;To chair/3-in-1   Stand to Sit Details (indicate cue type and reason) --   Ambulation/Gait   Ambulation/Gait Yes   Ambulation/Gait Assistance 5: Supervision   Ambulation Distance (Feet) 350 Feet  350' total with seat rest on rollator 2x   Assistive device Prosthesis;Rollator   Gait Pattern Step-to pattern;Decreased step length - right;Decreased stance time - left;Decreased stride length;Decreased hip/knee flexion - right;Decreased weight shift to right;Right hip hike;Trunk flexed;Abducted- right;Poor foot clearance - right   Ambulation Surface Indoor;Level;Outdoor;Paved   Ramp 5: Supervision  outdoors with rollator/prosthesis   Ramp Details (indicate cue type and reason) cues on brake management for safety   Curb 5: Supervision  outdoor curb x1 with rollator/prosthesis   Curb Details (indicate cue type and reason) cues on general technique for safety                PT Education - 10/08/15 1230    Education provided Yes   Education Details managing activity level with pain only increasing 2 units on 0-10 scale then resting; using this technique and rollator with seat to enable to rest and increase activity level   Person(s) Educated Patient   Methods Explanation   Comprehension Verbalized understanding          PT Short Term Goals - 09/12/15 1302    PT SHORT TERM GOAL #1   Title Patient demonstrates understanding of initail HEP with minimal cues. (Target Date: 09/12/2015)   Baseline 09/12/15: pt independent with OTAGO program   Status Achieved   PT SHORT TERM GOAL #2   Title patient verbalizes understanding of fall prevention strategies in home. (Target Date: 09/12/2015)   Baseline 09/12/15: pt given strategies on 08/28/15, able to recount most of them today   Status Partially Met   PT SHORT TERM GOAL #3   Title patient verbalizes understanding proper activity modification to assist with pain management. (Target Date: 09/12/2015)    Baseline 09/12/15: pt verbalizes she knows she needs to exercise, not all at once, daily for best results, however not actually  doing this on daily basis   Status Achieved           PT Long Term Goals - 10/08/15 1315    PT LONG TERM GOAL #1   Title Patient verbalizes & demonstrates understanding of ongoing HEP / fitness plan. (Target Date: 10/12/2015)   Baseline MET 10/08/2015   Time 2   Period Months   Status Achieved   PT LONG TERM GOAL #2   Title Patient demonstrates / verbalizes managing activity level to manage pain. (Target Date: 10/12/2015)   Baseline MET 10/08/2015   Time 2   Period Months   Status Achieved   PT LONG TERM GOAL #3   Title Patient ambulates 250' with rolling walker or rollator walker & prosthesis modified independent.  (Target Date: 10/12/2015)   Time 2   Period Months   Status On-going   PT LONG TERM GOAL #4   Title Timed Up & Go with rolling walker & prosthesis <20 seconds safely to decrease risk of falls.  (Target Date: 10/12/2015)   Time 2   Period Months   Status On-going   PT LONG TERM GOAL #5   Title Berg Balance >20 / 56  (Target Date: 10/12/2015)   Baseline MET 10/08/2015 Berg Balance 34/56   Time 2   Period Months   Status Achieved   PT LONG TERM GOAL #6   Title Patient negotiates ramps & curbs with rolling walker & stairs with 1 rail with prosthesis with supervision.  (Target Date: 10/12/2015)   Time 2   Period Months   Status On-going   PT LONG TERM GOAL #7   Title Patient self reports via FOTO improvement in functional status by > 5 points.  (Target Date: 10/12/2015)   Time 2   Period Months   Status On-going               Plan - 10/08/15 1230    Clinical Impression Statement Patient appears ready for discharge at next visit. Patient appears safe with rollator walker for community mobility to enable to use seat to rest as needed.    Pt will benefit from skilled therapeutic intervention in order to improve on the  following deficits Abnormal gait;Decreased activity tolerance;Decreased balance;Decreased endurance;Decreased knowledge of use of DME;Decreased mobility;Decreased range of motion;Decreased strength;Impaired flexibility;Prosthetic Dependency;Postural dysfunction;Pain   Rehab Potential Good   Clinical Impairments Affecting Rehab Potential chronic medical conditions with non-compliance    PT Frequency 2x / week   PT Duration Other (comment)  9 weeks   PT Treatment/Interventions ADLs/Self Care Home Management;Moist Heat;DME Instruction;Gait training;Stair training;Functional mobility training;Therapeutic activities;Therapeutic exercise;Balance training;Neuromuscular re-education;Patient/family education;Prosthetic Training;Manual techniques;Passive range of motion   PT Next Visit Plan assess remaining LTGs including FOTO and discharge   Consulted and Agree with Plan of Care Patient        Problem List Patient Active Problem List   Diagnosis Date Noted  . Type 2 diabetes with nephropathy ([place]) 09/07/2015  . Uncontrolled diabetes mellitus with retinopathy, due to underlying condition, without macular edema ([place]) 09/05/2015  . Diabetic retinopathy ([place]) 09/05/2015  . Skin tag 08/08/2015  . Counseling regarding end of life decision making 06/14/2015  . Atherosclerosis of aorta ([place]) 04/04/2015  . Anemia 10/05/2014  . Chronic diastolic heart failure ([place])   . S/P BKA (below knee amputation) unilateral ([place])   . Tobacco abuse   . Severe obesity (BMI >= 40) ([place]) 03/02/2013  . Abnormality of gait 03/01/2013  . Infective endocarditis 01/22/2013  .  Healthcare maintenance 07/10/2012  . Opioid dependence ([place]) 12/03/2011  . PVD in DM 08/27/2011  . GLAUCOMA 11/29/2009  . Essential hypertension 11/29/2009  . Chronic insomnia 10/25/2009  . GASTROESOPHAGEAL REFLUX DISEASE 11/24/2008  . Depression, major, severe recurrence ([place]) 04/06/2008  . DM (diabetes mellitus) type II uncontrolled, periph  vascular disorder ([place]) 04/02/2007  . Hyperlipemia 01/08/2007  . COPD (chronic obstructive pulmonary disease) ([place]) 01/08/2007    Aryan Bello PT, DPT 10/08/2015, 9:20 PM  [facility] [address] [place], [place], [address] Phone: [phone]   Fax:  [phone]  Name: GAILA ENGEBRETSEN MRN: [id number] Date of Birth: Feb 03, 1961

## 2015-10-09 ENCOUNTER — Encounter: Payer: Self-pay | Admitting: Dietician

## 2015-10-09 NOTE — Progress Notes (Signed)
[name], female   DOB: [birth year], 54 y.o.   MRN: [id number] Patient called for bus passes for her upcoming appointment. Two were mailed to her today.

## 2015-10-10 ENCOUNTER — Encounter: Payer: Self-pay | Admitting: Physical Therapy

## 2015-10-10 ENCOUNTER — Ambulatory Visit: Payer: Medicare Other | Admitting: Physical Therapy

## 2015-10-10 DIAGNOSIS — Z89512 Acquired absence of left leg below knee: Secondary | ICD-10-CM

## 2015-10-10 DIAGNOSIS — M792 Neuralgia and neuritis, unspecified: Secondary | ICD-10-CM | POA: Diagnosis not present

## 2015-10-10 DIAGNOSIS — R531 Weakness: Secondary | ICD-10-CM | POA: Diagnosis not present

## 2015-10-10 DIAGNOSIS — M545 Low back pain: Secondary | ICD-10-CM | POA: Diagnosis not present

## 2015-10-10 DIAGNOSIS — R6889 Other general symptoms and signs: Secondary | ICD-10-CM | POA: Diagnosis not present

## 2015-10-10 DIAGNOSIS — R269 Unspecified abnormalities of gait and mobility: Secondary | ICD-10-CM | POA: Diagnosis not present

## 2015-10-10 NOTE — Therapy (Signed)
[facility] [address] [place], [place], [address] Phone: [phone]   Fax:  [phone]  Physical Therapy Treatment  Patient Details  Name: Jennifer [name] MRN: [id number] Date of Birth: 02/13/1961 No Data Recorded  Encounter Date: 10/10/2015      PT End of Session - 10/10/15 1237    Visit Number 16   Number of Visits 18   Date for PT Re-Evaluation 10/12/15   Authorization Type Medicare G-Code 10th visit   PT Start Time 1233   PT Stop Time 1315   PT Time Calculation (min) 42 min   Equipment Utilized During Treatment Gait belt   Activity Tolerance Patient tolerated treatment well   Behavior During Therapy WFL for tasks assessed/performed      Past Medical History  Diagnosis Date  . Depression   . GERD (gastroesophageal reflux disease)   . Hyperlipidemia   . Hypertension   . Glaucoma   . PVD (peripheral vascular disease) with claudication (HCC)     Stents to bilateral common iliac arteries (left 2005, right 2008), on chronic plavix  . Hyperplastic colon polyp 12/2010    Per colonoscopy (12/2010) - Dr. Kaplan  . Asthma   . COPD 01/08/2007    PFT's 05/2007 : FEV1/FVC 82, FEV1 64% pred, FEF 25-75% 40% predicted, 16% improvement in FEV1 with bronchodilators.     . Tobacco abuse   . Chronic osteomyelitis of foot (HCC)     chronic, right secondary to diabetic foot ulcers  . Polymicrobial bacterial infection 01/2013    GBS and S. aureus bacteremia // Source likely infected diabetic foot ulcer  . 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 Blood cultures 01/26/13 S. Aureus and GBS. Blood cultures Feb 6th, 8th, and 9th and March were negative.Repeat TEE 3/20 negative for vegitations  . 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 Chronic diastolic heart failure (HCC)     grade 2 per 2D echocardiogram (01/2013)  . Ulcer of foot, chronic (HCC)    Left. No OM per MRI (01/2013)  . DVT of upper extremity (deep vein thrombosis) (HCC) 03/11/2013    Secondary to PICC line. Right brachial vein, diagnosed on 03/10/2013 Coumadin for 3 months. End date 06/10/2013   . Chronic pain syndrome 12/03/2011    Likely secondary to depression, "fibromyalgia", neuropathy, and obesity. Lumbar MRI 2014 no sig change from prior (2008) : Stable hypertrophic facet disease most notable at L4-5. Stable shallow left foraminal/extraforaminal disc protrusion at L4-5. No direct neural compression.      . Environmental allergies     Hx: of  . Fatty liver 2003    observed on ultrasound abdomen  . CHF (congestive heart failure) (HCC)   . Anginal pain (HCC)     '3' of 10 ischemia ruled out 9/9   . Fibromyalgia   . Chronic bronchitis (HCC)     "I get it alot" (09/28/2013)  . Exertional shortness of breath   . Rheumatoid arthritis (HCC)   . Arthritis of lumbar spine   . Chronic lower back pain   . Diabetic peripheral neuropathy (HCC)   . Type II diabetes mellitus with peripheral circulatory disorders, uncontrolled DX: 1993    Insulin dep. Poor control. Complicated by diabetic foot ulcer and diabetic eye disease.    . 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 2/2 tobacco use, DM, and cont weight bearing on surgical wound and developed gangrene     Past Surgical History  Procedure Laterality Date  . Abdominal hysterectomy  1997    secondary to uterine fibroids  . Tubal ligation    . Shoulder arthroscopy w/ rotator cuff repair Bilateral   . Breast biopsy      multiple-benign per pt  . Wrist surgery Right     "for tumors" (09/28/2013)  . Ganglion cyst excision      multiple  . Other surgical history      stents in lower ext  . Tee without cardioversion N/A 01/31/2013    Procedure: TRANSESOPHAGEAL ECHOCARDIOGRAM (TEE);  Surgeon: Fay Records, MD;  Location: [facility];  Service: Cardiovascular;   Laterality: N/A;  Rm [phone]  . Bladder surgery      bladder reconstruction surgery  . Tee without cardioversion N/A 03/10/2013    Procedure: TRANSESOPHAGEAL ECHOCARDIOGRAM (TEE);  Surgeon: Larey Dresser, MD;  Location: [facility];  Service: Cardiovascular;  Laterality: N/A;  Rm. [id number]  . Skin split graft Bilateral 05/13/2013    Procedure: Right and Left Foot Allograft Skin Graft;  Surgeon: Newt Minion, MD;  Location: [facility];  Service: Orthopedics;  Laterality: Bilateral;  Right and Left Foot Allograft Skin Graft  . Toe amputation Left 08/31/2013    4TH & 5 TH TOE   . Amputation Left 08/31/2013    Procedure: AMPUTATION RAY;  Surgeon: Newt Minion, MD;  Location: [facility];  Service: Orthopedics;  Laterality: Left;  Left Foot 5th Ray Amputation  . Esophagogastroduodenoscopy N/A 09/20/2013    Procedure: ESOPHAGOGASTRODUODENOSCOPY (EGD);  Surgeon: Jerene Bears, MD;  Location: [facility];  Service: Gastroenterology;  Laterality: N/A;  . Foot amputation through metatarsal Left 09/28/2013  . Tonsillectomy    . Amputation Left 09/28/2013    Procedure: Left Midfoot amputation;  Surgeon: Newt Minion, MD;  Location: [facility];  Service: Orthopedics;  Laterality: Left;  Left Midfoot amputation  . Amputation Left 10/14/2013    Procedure: AMPUTATION BELOW KNEE- left;  Surgeon: Newt Minion, MD;  Location: [facility];  Service: Orthopedics;  Laterality: Left;  Left Below Knee Amputation     There were no vitals filed for this visit.  Visit Diagnosis:  Abnormality of gait  Activity intolerance  Weakness generalized  Status post below knee amputation of left lower extremity (HCC)  Decreased functional activity tolerance      Subjective Assessment - 10/10/15 1235    Subjective No new complaints. No falls.    Currently in Pain? Yes   Pain Score 7    Pain Location Generalized  back and legs   Pain Orientation Lower   Pain Descriptors / Indicators Aching;Pins and needles   Pain Type Chronic pain   Pain  Onset More than a month ago   Pain Frequency Constant   Aggravating Factors  rainy weather, increased activities   Pain Relieving Factors rest, medication          [facility] Adult PT Treatment/Exercise - 10/10/15 1238    Transfers   Sit to Stand 6: Modified independent (Device/Increase time)   Stand to Sit 6: Modified independent (Device/Increase time);With upper extremity assist;With armrests;To chair/3-in-1   Ambulation/Gait   Ambulation/Gait Yes   Ambulation/Gait Assistance 5: Supervision   Ambulation/Gait Assistance Details cues on posture only   Ambulation Distance (Feet) 270 Feet   Assistive device Prosthesis;Rollator   Gait Pattern Step-through pattern;Decreased stride length;Decreased  step length - right;Decreased step length - left;Trunk flexed   Ambulation Surface Level;Indoor   Stairs Yes   Stairs Assistance 6: Modified independent (Device/Increase time)   Stair Management Technique One rail Right;Step to pattern;Sideways   Number of Stairs 4   Ramp 5: Supervision  with rollator/prosthesis   Ramp Details (indicate cue type and reason) no cues or assist needed, pt with safe use of rollator/brakes   Curb 5: Supervision  with rollator/prosthesis   Curb Details (indicate cue type and reason) no cues or assist needed. pt with safe use of rollator and brakes   Timed Up and Go Test   TUG Normal TUG   Normal TUG (seconds) 20.81           PT Short Term Goals - 09/12/15 1302    PT SHORT TERM GOAL #1   Title Patient demonstrates understanding of initail HEP with minimal cues. (Target Date: 09/12/2015)   Baseline 09/12/15: pt independent with OTAGO program   Status Achieved   PT SHORT TERM GOAL #2   Title patient verbalizes understanding of fall prevention strategies in home. (Target Date: 09/12/2015)   Baseline 09/12/15: pt given strategies on 08/28/15, able to recount most of them today   Status Partially Met   PT SHORT TERM GOAL #3   Title patient verbalizes understanding  proper activity modification to assist with pain management. (Target Date: 09/12/2015)   Baseline 09/12/15: pt verbalizes she knows she needs to exercise, not all at once, daily for best results, however not actually doing this on daily basis   Status Achieved           PT Long Term Goals - 10/10/15 1256    PT LONG TERM GOAL #1   Title Patient verbalizes & demonstrates understanding of ongoing HEP / fitness plan. (Target Date: 10/12/2015)   Baseline MET 10/08/2015   Status Achieved   PT LONG TERM GOAL #2   Title Patient demonstrates / verbalizes managing activity level to manage pain. (Target Date: 10/12/2015)   Baseline MET 10/08/2015   Status Achieved   PT LONG TERM GOAL #3   Title Patient ambulates 250' with rolling walker or rollator walker & prosthesis modified independent.  (Target Date: 10/12/2015)   Baseline 10/09/16: 270 feet with 1 seated rest break, pt unable to go distance without resting.   Status Partially Met   PT LONG TERM GOAL #4   Title Timed Up & Go with rolling walker & prosthesis <20 seconds safely to decrease risk of falls.  (Target Date: 10/12/2015)   Baseline 10/10/15: 20.81 seconds with rollator   Status Not Met   PT LONG TERM GOAL #5   Title Berg Balance >20 / 56  (Target Date: 10/12/2015)   Baseline MET 10/08/2015 Berg Balance 34/56   Status Achieved   PT LONG TERM GOAL #6   Title Patient negotiates ramps & curbs with rolling walker & stairs with 1 rail with prosthesis with supervision.  (Target Date: 10/12/2015)   Baseline Met on 10/10/15.   Status Achieved   PT LONG TERM GOAL #7   Title Patient self reports via FOTO improvement in functional status by > 5 points.  (Target Date: 10/12/2015)   Baseline met on 10/10/15   Status Achieved           Plan - 10/10/15 1237    Clinical Impression Statement Pt has met 5 of 7 LTG"s. She made progress toward the unmet goals as well since eval. Pt agreeable to discharge   today per PT plan of care.   Pt will  benefit from skilled therapeutic intervention in order to improve on the following deficits Abnormal gait;Decreased activity tolerance;Decreased balance;Decreased endurance;Decreased knowledge of use of DME;Decreased mobility;Decreased range of motion;Decreased strength;Impaired flexibility;Prosthetic Dependency;Postural dysfunction;Pain   Rehab Potential Good   Clinical Impairments Affecting Rehab Potential chronic medical conditions with non-compliance    PT Frequency 2x / week   PT Duration Other (comment)  9 weeks   PT Treatment/Interventions ADLs/Self Care Home Management;Moist Heat;DME Instruction;Gait training;Stair training;Functional mobility training;Therapeutic activities;Therapeutic exercise;Balance training;Neuromuscular re-education;Patient/family education;Prosthetic Training;Manual techniques;Passive range of motion   PT Next Visit Plan discharge today   Consulted and Agree with Plan of Care Patient        Problem List Patient Active Problem List   Diagnosis Date Noted  . Type 2 diabetes with nephropathy ([place]) 09/07/2015  . Uncontrolled diabetes mellitus with retinopathy, due to underlying condition, without macular edema ([place]) 09/05/2015  . Diabetic retinopathy ([place]) 09/05/2015  . Skin tag 08/08/2015  . Counseling regarding end of life decision making 06/14/2015  . Atherosclerosis of aorta ([place]) 04/04/2015  . Anemia 10/05/2014  . Chronic diastolic heart failure ([place])   . S/P BKA (below knee amputation) unilateral ([place])   . Tobacco abuse   . Severe obesity (BMI >= 40) ([place]) 03/02/2013  . Abnormality of gait 03/01/2013  . Infective endocarditis 01/22/2013  . Healthcare maintenance 07/10/2012  . Opioid dependence ([place]) 12/03/2011  . PVD in DM 08/27/2011  . GLAUCOMA 11/29/2009  . Essential hypertension 11/29/2009  . Chronic insomnia 10/25/2009  . GASTROESOPHAGEAL REFLUX DISEASE 11/24/2008  . Depression, major, severe recurrence ([place]) 04/06/2008  . DM (diabetes  mellitus) type II uncontrolled, periph vascular disorder ([place]) 04/02/2007  . Hyperlipemia 01/08/2007  . COPD (chronic obstructive pulmonary disease) ([place]) 01/08/2007    Willow Ora 10/10/2015, 2:29 PM  Willow Ora, PTA, [facility] [address] [place], [place] [address] [phone] 10/10/2015, 2:29 PM   Name: Jennifer [name] MRN: [id number] Date of Birth: [birth year]

## 2015-10-11 ENCOUNTER — Other Ambulatory Visit: Payer: Self-pay | Admitting: Internal Medicine

## 2015-10-11 ENCOUNTER — Encounter: Payer: Self-pay | Admitting: Physical Therapy

## 2015-10-11 ENCOUNTER — Ambulatory Visit (INDEPENDENT_AMBULATORY_CARE_PROVIDER_SITE_OTHER): Payer: Medicare Other | Admitting: Dietician

## 2015-10-11 ENCOUNTER — Encounter: Payer: Self-pay | Admitting: Internal Medicine

## 2015-10-11 ENCOUNTER — Ambulatory Visit: Payer: Medicare Other | Admitting: Dietician

## 2015-10-11 DIAGNOSIS — Z7189 Other specified counseling: Secondary | ICD-10-CM | POA: Diagnosis not present

## 2015-10-11 DIAGNOSIS — F332 Major depressive disorder, recurrent severe without psychotic features: Secondary | ICD-10-CM

## 2015-10-11 NOTE — Therapy (Signed)
[facility] [address] [place], [place], [address] Phone: [phone]   Fax:  [phone]  Patient Details  Name: Jennifer [name] MRN: [id number] Date of Birth: 26-Jan-1961 Referring Provider: Larey Dresser, MD  Encounter Date: 10/11/2015  PHYSICAL THERAPY DISCHARGE SUMMARY  Visits from Start of Care: 16  Current functional level related to goals / functional outcomes:       PT Long Term Goals - 10/10/15 1256    PT LONG TERM GOAL #1   Title Patient verbalizes & demonstrates understanding of ongoing HEP / fitness plan. (Target Date: 10/12/2015)   Baseline MET 10/08/2015   Status Achieved   PT LONG TERM GOAL #2   Title Patient demonstrates / verbalizes managing activity level to manage pain. (Target Date: 10/12/2015)   Baseline MET 10/08/2015   Status Achieved   PT LONG TERM GOAL #3   Title Patient ambulates 250' with rolling walker or rollator walker & prosthesis modified independent.  (Target Date: 10/12/2015)   Baseline 10/09/16: 270 feet with 1 seated rest break, pt unable to go distance without resting.   Status Partially Met   PT LONG TERM GOAL #4   Title Timed Up & Go with rolling walker & prosthesis <20 seconds safely to decrease risk of falls.  (Target Date: 10/12/2015)   Baseline 10/10/15: 20.81 seconds with rollator   Status Not Met   PT LONG TERM GOAL #5   Title Berg Balance >20 / 56  (Target Date: 10/12/2015)   Baseline MET 10/08/2015 Berg Balance 34/56   Status Achieved   PT LONG TERM GOAL #6   Title Patient negotiates ramps & curbs with rolling walker & stairs with 1 rail with prosthesis with supervision.  (Target Date: 10/12/2015)   Baseline Met on 10/10/15.   Status Achieved   PT LONG TERM GOAL #7   Title Patient self reports via FOTO improvement in functional status by > 5 points.  (Target Date: 10/12/2015)   Baseline met on 10/10/15   Status Achieved       Remaining  deficits: Patient has chronic back & fibromyalgia pain. She verbalizes and demonstrates how to progress activity level without increasing pain more than 2 increments from baseline before she engages in the activity.   Education / Equipment: HEP. Use of HEP & activity level to maximize function within her pain limits.   Plan: Patient agrees to discharge.  Patient goals were partially met. Patient is being discharged due to meeting the stated rehab goals.  ?????        Jamey Reas PT, DPT 10/11/2015, 3:50 PM  [facility] [address] [place], [place], [address] Phone: [phone]   Fax:  [phone]

## 2015-10-11 NOTE — Progress Notes (Signed)
Health Coaching Progress note:  Start time:1530 End time: Jennifer [name] is here today for health coaching for her depression. Today she rates her depression control as 2/10. She asked her son & family  to find alternative housing.   Patient Identified Concern:  Depression and has little hope to someday be able to do the things she used to enjoy, amputation, fear of pain, lack of material resources  Stage of Change Patient Is In:  Contemplating/preparation/ready Patient Reported Barriers:  Feelings of responsibility, depression, loss, fear of pain, worthlessness Patient Reported Perceived Benefits:  Few at this time Patient Reports Self-Efficacy:   Poor/medium Behavior Change Supports:  Her father  Goals:  Have her house to herself. She does not see the value in obtaining a walker, so did not call to look into getting it fixed.  Patient Education:  Alternatives for coping with pain Follow up : 1 week - patient to call to say when she'll have the money to pay for transportation

## 2015-10-12 NOTE — Telephone Encounter (Signed)
MEd was stopped in April 2/2 GI side effects

## 2015-10-17 ENCOUNTER — Encounter: Payer: Self-pay | Admitting: Internal Medicine

## 2015-10-18 ENCOUNTER — Encounter: Payer: Self-pay | Admitting: Internal Medicine

## 2015-10-18 ENCOUNTER — Ambulatory Visit (INDEPENDENT_AMBULATORY_CARE_PROVIDER_SITE_OTHER): Payer: Medicare Other | Admitting: Internal Medicine

## 2015-10-18 VITALS — BP 158/63 | HR 54 | Temp 97.9°F | Wt 281.8 lb

## 2015-10-18 DIAGNOSIS — E1169 Type 2 diabetes mellitus with other specified complication: Secondary | ICD-10-CM | POA: Diagnosis not present

## 2015-10-18 DIAGNOSIS — F332 Major depressive disorder, recurrent severe without psychotic features: Secondary | ICD-10-CM

## 2015-10-18 DIAGNOSIS — I10 Essential (primary) hypertension: Secondary | ICD-10-CM

## 2015-10-18 DIAGNOSIS — I152 Hypertension secondary to endocrine disorders: Secondary | ICD-10-CM

## 2015-10-18 DIAGNOSIS — Z79891 Long term (current) use of opiate analgesic: Secondary | ICD-10-CM

## 2015-10-18 DIAGNOSIS — M25551 Pain in right hip: Secondary | ICD-10-CM

## 2015-10-18 DIAGNOSIS — E1159 Type 2 diabetes mellitus with other circulatory complications: Secondary | ICD-10-CM

## 2015-10-18 DIAGNOSIS — F1120 Opioid dependence, uncomplicated: Secondary | ICD-10-CM

## 2015-10-18 MED ORDER — LIDOCAINE 5 % EX PTCH: 1.0000 | MEDICATED_PATCH | CUTANEOUS | Status: DC

## 2015-10-18 MED ORDER — AMLODIPINE BESYLATE 10 MG PO TABS: 10.0000 mg | ORAL_TABLET | Freq: Every day | ORAL | Status: DC

## 2015-10-18 MED ORDER — ARIPIPRAZOLE 2 MG PO TABS: 2.0000 mg | ORAL_TABLET | Freq: Every day | ORAL | Status: DC

## 2015-10-18 NOTE — Assessment & Plan Note (Signed)
Currently on Zoloft 100. No longer taking the trazodone. Previously on Elavil, wellbutrin, and lexapro - none of which improved her sxs. She feels depressed / down, doesn't enjoy doing anything, doesn't want to be around anyone bc she would bring them down, freq isolates herself in her room. Her sxs are impacting her ability to control her medical issues. She cannot afford co-pay for psychiatry. Willing to try Abilify.  I spoke to psych yesterday in general terms about adding Abilify to Zoloft and gen he had success. He started at 5 mg but all literature I read states starting at 25% of starting dose when combined with zoloft which would be a 0.5 mg dose - hard to get from 2 mg pill.   I spoke to [place] about risk of impulsive behaviour and interaction with other meds and she is willing to take risk.  PLAN : Abilify 2 mg. F/U one month and titrate up med to 5 mg then 10 mg if tolerated and helping.

## 2015-10-18 NOTE — Assessment & Plan Note (Signed)
BP Readings from Last 3 Encounters:  10/18/15 158/63  10/04/15 160/80  09/06/15 143/64    She is on norvasc 10, atenolol 100, lotensin 40, and lasix (compliant with all but lasix). BP not at goal. But would need to add another agent. Not ideal since starting new med today.   PLAN : cont to follow. Consider changing lasix to HCTZ or aldactone.

## 2015-10-18 NOTE — Assessment & Plan Note (Addendum)
She made appt today to discuss her R hip pain that started while on MRI table in June. Her pain in June leading to the MRI is different than the pain occuring since June. This pain occurs 4-5 nights per week. Only comes on when asleep. Starts R posterior inf buttock and radiates down post R leg to bottom of foot / plantar surface. It is severe and she is unable to sleep. It eases off on own over few hrs. Oxycodone only takes edge off and muscle relaxant does nothing. She lays on her L side at night. Never gets pain when sitting or walking / standing.   This fits S1 / S2 dermatome. Her MRI did not show any S1 / S2 nerve root impingement nor any cause for this pain. Doubt trochanteric bursitis as no pain on palp over that area and not lying on R side. Doubt OA as no groin pain. Doubt fracture - pain started prior to a fall. Last appt I thought it was ischemic pain but she had ABI and vascular appt and R/O that dx.  PLAN : start with plain films R hip and coccyx. Lidoderm patches for pain reflief,

## 2015-10-18 NOTE — Progress Notes (Signed)
   Subjective:    [name], female    DOB: 21-Aug-1961, 54 y.o.   MRN: [id number]  HPI  Jennifer [name] is here for R hip pain. Please see the A&P for the status of the pt's chronic medical problems.  Soc Hx : Over recent past, fiance, sister, and mother died and she lost her L leg. In [place].  Review of Systems  Cardiovascular: Positive for leg swelling.  Musculoskeletal: Positive for myalgias, back pain, arthralgias and gait problem.  Skin: Negative for rash and wound.  Psychiatric/Behavioral: Positive for sleep disturbance and dysphoric mood. Negative for suicidal ideas. The patient is not nervous/anxious.        Objective:   Physical Exam  Constitutional: She is oriented to person, place, and time. She appears well-developed and well-nourished. No distress.  Smells of cig smoke  HENT:  Head: Normocephalic and atraumatic.  Right Ear: External ear normal.  Left Ear: External ear normal.  Nose: Nose normal.  Eyes: Conjunctivae and EOM are normal.  Pulmonary/Chest: Effort normal.  Musculoskeletal: She exhibits edema. She exhibits no tenderness.  Neurological: She is alert and oriented to person, place, and time.  Skin: Skin is warm and dry. She is not diaphoretic.  CVI changes RLE  Psychiatric: She has a normal mood and affect. Her behavior is normal. Judgment and thought content normal.          Assessment & Plan:

## 2015-10-18 NOTE — Patient Instructions (Signed)
Take 10 mg (2 5 mg pills) of your norvasc / amlodipine  Return to hospital for Xrays  Try the lidoderm patches when pain starts.

## 2015-10-26 ENCOUNTER — Ambulatory Visit: Payer: Medicare Other | Admitting: Dietician

## 2015-10-31 ENCOUNTER — Other Ambulatory Visit: Payer: Self-pay | Admitting: Internal Medicine

## 2015-10-31 NOTE — Telephone Encounter (Signed)
Rx phoned into [facility], pt aware.Jennifer [name], Jennifer Trulson Cassady11/9/20162:10 PM

## 2015-11-08 ENCOUNTER — Ambulatory Visit (INDEPENDENT_AMBULATORY_CARE_PROVIDER_SITE_OTHER): Payer: Medicare Other | Admitting: Internal Medicine

## 2015-11-08 ENCOUNTER — Other Ambulatory Visit: Payer: Self-pay | Admitting: Internal Medicine

## 2015-11-08 ENCOUNTER — Ambulatory Visit (INDEPENDENT_AMBULATORY_CARE_PROVIDER_SITE_OTHER): Payer: Medicare Other | Admitting: Dietician

## 2015-11-08 ENCOUNTER — Encounter: Payer: Self-pay | Admitting: Internal Medicine

## 2015-11-08 ENCOUNTER — Ambulatory Visit (HOSPITAL_COMMUNITY)
Admission: RE | Admit: 2015-11-08 | Discharge: 2015-11-08 | Disposition: A | Discharge status: Home / Self Care | Payer: Medicare Other | Source: Ambulatory Visit | Attending: Internal Medicine | Admitting: Internal Medicine

## 2015-11-08 ENCOUNTER — Other Ambulatory Visit (HOSPITAL_COMMUNITY): Payer: Self-pay | Admitting: *Deleted

## 2015-11-08 VITALS — BP 134/57 | HR 68 | Temp 98.0°F | Wt 279.1 lb

## 2015-11-08 DIAGNOSIS — M25551 Pain in right hip: Secondary | ICD-10-CM

## 2015-11-08 DIAGNOSIS — E1165 Type 2 diabetes mellitus with hyperglycemia: Secondary | ICD-10-CM | POA: Diagnosis not present

## 2015-11-08 DIAGNOSIS — E1151 Type 2 diabetes mellitus with diabetic peripheral angiopathy without gangrene: Secondary | ICD-10-CM | POA: Diagnosis not present

## 2015-11-08 DIAGNOSIS — J449 Chronic obstructive pulmonary disease, unspecified: Secondary | ICD-10-CM | POA: Diagnosis not present

## 2015-11-08 DIAGNOSIS — F17200 Nicotine dependence, unspecified, uncomplicated: Secondary | ICD-10-CM

## 2015-11-08 DIAGNOSIS — G8929 Other chronic pain: Secondary | ICD-10-CM | POA: Diagnosis not present

## 2015-11-08 DIAGNOSIS — M160 Bilateral primary osteoarthritis of hip: Secondary | ICD-10-CM | POA: Diagnosis not present

## 2015-11-08 DIAGNOSIS — G250 Essential tremor: Secondary | ICD-10-CM

## 2015-11-08 DIAGNOSIS — J42 Unspecified chronic bronchitis: Secondary | ICD-10-CM

## 2015-11-08 DIAGNOSIS — F332 Major depressive disorder, recurrent severe without psychotic features: Secondary | ICD-10-CM

## 2015-11-08 DIAGNOSIS — F1120 Opioid dependence, uncomplicated: Secondary | ICD-10-CM

## 2015-11-08 DIAGNOSIS — Z79891 Long term (current) use of opiate analgesic: Secondary | ICD-10-CM

## 2015-11-08 DIAGNOSIS — IMO0002 Reserved for concepts with insufficient information to code with codable children: Secondary | ICD-10-CM

## 2015-11-08 LAB — POCT GLYCOSYLATED HEMOGLOBIN (HGB A1C): Hemoglobin A1C: 7.3

## 2015-11-08 LAB — GLUCOSE, CAPILLARY: Glucose-Capillary: 217 mg/dL — ABNORMAL HIGH (ref 65–99)

## 2015-11-08 NOTE — Patient Instructions (Signed)
1. If your cough is no better by mid December, let me know. If it gets worse, let me know 2. See me in 3 months

## 2015-11-08 NOTE — Progress Notes (Signed)
Health Coaching Progress note:  Start time: 925 End time: [id number] last visit 10-11-15  Jennifer [name] is here today for health coaching for her depression. Today she rates her depression control as 4/10. She is looking forward to living alone, attending silver sneakers, getting fit and being with people.  Patient Identified Concern:  Depression- feels well 4/7 days  Stage of Change Patient Is In:  Contemplating/preparation per patient, however she is making changes Patient Reported Barriers:  "Her mind" for better and worse. Feelings of responsibility, depression, loss, fear, pain, worthlessness Patient Reported Perceived Benefits:  More this week, looking forward to being herself again both mentally and physically Patient Reports Self-Efficacy:  " Very confident" per patient Behavior Change Supports: 'nobody' Goals:  Get back to being me- responsible, indepent, taking care of myself.  Patient Education:  Alternatives for coping with pain Follow up : 2 weeks

## 2015-11-08 NOTE — Patient Instructions (Signed)
It was my pleasure meeting with you today.   Please make a follow up for health coaching for 2 weeks

## 2015-11-08 NOTE — Addendum Note (Signed)
Addended by: Marcelino Duster on: 11/08/2015 01:27 PM   Modules accepted: Orders

## 2015-11-09 ENCOUNTER — Encounter: Payer: Self-pay | Admitting: Dietician

## 2015-11-09 NOTE — Assessment & Plan Note (Addendum)
Jorley has had suboptimal response to several depression meds - currently Zoloft. We tried adding abilify last appt. She tolerated it for one week but stopped as if caused urinary freq.   PLAN : stop abilify. Cont zoloft and cont to monitor. Work with [name]

## 2015-11-09 NOTE — Assessment & Plan Note (Addendum)
Her R hip pain cont.  She has not been able to get the plain films. Clarified that sxs last 5-10 min but recur and all episodes together can last 4 hrs. No numbness but does radiate from buttock, down posterior leg, to plantar surface. Leg feels heavy but pain is the predominate sxs. Otherwise, unchanged from prior. Vascular dz R/O with ABI's and vascular appt. Had lumbar MRI over summer.   PLAN : Plain films today (no explanation for the pain). Referral to pain therapy - to help with determining the etiology of the pain - not to take over medication admin. I will cont to prescribe her medicines. I had planned a trial of a slow taper of her oxycodone but her hip pain might prevent it.

## 2015-11-09 NOTE — Progress Notes (Addendum)
   Subjective:    [name], female    DOB: 09-11-1961, 54 y.o.   MRN: [id number]  HPI  Jennifer [name] is here for R hip pain f/u. Please see the A&P for the status of the pt's chronic medical problems.   Review of Systems  Constitutional: Negative for fever and diaphoresis.  HENT: Negative for rhinorrhea, sinus pressure and sore throat.   Respiratory: Positive for cough and shortness of breath.   Gastrointestinal: Negative for nausea, vomiting and diarrhea.       No GERD sxs  Musculoskeletal: Positive for back pain, arthralgias and gait problem.  Skin: Negative for pallor and rash.  Neurological: Positive for tremors and weakness. Negative for facial asymmetry.  Psychiatric/Behavioral: Positive for sleep disturbance and dysphoric mood.       Objective:   Physical Exam  Constitutional: She is oriented to person, place, and time. She appears well-developed and well-nourished. No distress.  HENT:  Head: Normocephalic and atraumatic.  Right Ear: External ear normal.  Left Ear: External ear normal.  Nose: Nose normal.  Eyes: Conjunctivae and EOM are normal.  Cardiovascular: Normal rate, regular rhythm and normal heart sounds.   Pulmonary/Chest: Effort normal and breath sounds normal. No respiratory distress. She has no wheezes.  Musculoskeletal:  Non pitting edema RLE. L leg missing  Neurological: She is alert and oriented to person, place, and time.  No tremor, rigidity, cogwheel  Skin: Skin is warm and dry. She is not diaphoretic.  Psychiatric: She has a normal mood and affect. Her behavior is normal. Judgment and thought content normal.    Soc Hx : cont to smoke, no progress quiting      Assessment & Plan:

## 2015-11-12 DIAGNOSIS — G253 Myoclonus: Secondary | ICD-10-CM | POA: Insufficient documentation

## 2015-11-12 NOTE — Assessment & Plan Note (Signed)
Sxs started about 2 weeks ago with cold like sxs and she laid in bed for 2 days. She had no myalgias. The diaphoresis is gone. She is spitting up phlegm, coughing, dyspneic at times. She denies heartburn, sore throat, GI sxs, and fever. Her son had similar sxs. IF she could get rid of the cough, she would feel fine.  A : post viral cough  P : Time. Since she is a smoker, it will take longer to resolve. If cough is still present mid Dec, she is to let me know so I can order a CXR.

## 2015-11-12 NOTE — Assessment & Plan Note (Signed)
She c/o her hands shaking, notices it when she is using cell phone or eating. Has to use 2 hands to steady the phone. In addition to her hands, her mid section "jerks". It occurs both at rest and active. This has been gong on for yrs and is getting worse. She notices no bradykinesia or shuffling gait. The sxs increase when she is nervous or emotional.   A : essential tremor  P : reassurance for now. Monitor for development of parkinson's sxs.

## 2015-11-22 ENCOUNTER — Ambulatory Visit: Payer: Medicare Other | Admitting: Dietician

## 2015-12-03 ENCOUNTER — Other Ambulatory Visit: Payer: Self-pay | Admitting: Internal Medicine

## 2015-12-04 NOTE — Telephone Encounter (Signed)
KCL is the only Rx she does not have RF on. All others filled for one yr in Aug or Oct.

## 2015-12-31 ENCOUNTER — Other Ambulatory Visit: Payer: Self-pay | Admitting: Internal Medicine

## 2015-12-31 ENCOUNTER — Telehealth: Payer: Self-pay | Admitting: Dietician

## 2015-12-31 DIAGNOSIS — F1120 Opioid dependence, uncomplicated: Secondary | ICD-10-CM

## 2015-12-31 NOTE — Telephone Encounter (Signed)
Pt requesting oxycodone to be filled. °

## 2015-12-31 NOTE — Telephone Encounter (Signed)
Last filled at [facility] 12/22 Last uds 08/2015 Next appt not scheduled, should be in feb according to last visit note

## 2015-12-31 NOTE — Telephone Encounter (Signed)
Called to follow up missed appointment in December. She is coughing intermittently making conversation difficult at times. She says she is not taking her fluid pill because it makes her urinate. Reminded her that Dr. [name] wanted to see her in February but that she was to call if her cough did not improve. She wants to try to schedule Dr. [name], health coach and prescription pick up for same day, so she agreed to call the front desk/triage nurse to inform them.   Of note, patient says she still does not have enough money to pay on her eye doctor bills so she does not feel she can schedule an appointment at this time. CDE will look into possible scholarships. She says she is also in need of a diabetic shoe, but cannot pay the copay for it.  Patient calls back, she has scheduled her appointments for this Thursday

## 2015-12-31 NOTE — Telephone Encounter (Signed)
Pls sch appt Dr [name] in Feb - routine F/U  Refill too early

## 2016-01-03 ENCOUNTER — Encounter: Payer: Self-pay | Admitting: Dietician

## 2016-01-03 ENCOUNTER — Ambulatory Visit (INDEPENDENT_AMBULATORY_CARE_PROVIDER_SITE_OTHER): Payer: Medicare Other | Admitting: Internal Medicine

## 2016-01-03 ENCOUNTER — Ambulatory Visit (INDEPENDENT_AMBULATORY_CARE_PROVIDER_SITE_OTHER): Payer: Medicare Other | Admitting: Dietician

## 2016-01-03 ENCOUNTER — Other Ambulatory Visit: Payer: Self-pay | Admitting: Internal Medicine

## 2016-01-03 VITALS — BP 157/72 | HR 88 | Temp 97.8°F | Wt 275.4 lb

## 2016-01-03 DIAGNOSIS — J069 Acute upper respiratory infection, unspecified: Secondary | ICD-10-CM | POA: Diagnosis not present

## 2016-01-03 DIAGNOSIS — E1159 Type 2 diabetes mellitus with other circulatory complications: Secondary | ICD-10-CM | POA: Diagnosis not present

## 2016-01-03 DIAGNOSIS — E1165 Type 2 diabetes mellitus with hyperglycemia: Secondary | ICD-10-CM | POA: Diagnosis not present

## 2016-01-03 DIAGNOSIS — Z7189 Other specified counseling: Secondary | ICD-10-CM

## 2016-01-03 DIAGNOSIS — F332 Major depressive disorder, recurrent severe without psychotic features: Secondary | ICD-10-CM

## 2016-01-03 DIAGNOSIS — Z89512 Acquired absence of left leg below knee: Secondary | ICD-10-CM

## 2016-01-03 DIAGNOSIS — F17210 Nicotine dependence, cigarettes, uncomplicated: Secondary | ICD-10-CM

## 2016-01-03 DIAGNOSIS — I152 Hypertension secondary to endocrine disorders: Secondary | ICD-10-CM

## 2016-01-03 DIAGNOSIS — I10 Essential (primary) hypertension: Secondary | ICD-10-CM

## 2016-01-03 DIAGNOSIS — F1120 Opioid dependence, uncomplicated: Secondary | ICD-10-CM

## 2016-01-03 LAB — GLUCOSE, CAPILLARY: Glucose-Capillary: 375 mg/dL — ABNORMAL HIGH (ref 65–99)

## 2016-01-03 MED ORDER — GUAIFENESIN-DM 100-10 MG/5ML PO SYRP: 5.0000 mL | ORAL_SOLUTION | ORAL | Status: DC | PRN

## 2016-01-03 MED ORDER — BENZONATATE 100 MG PO CAPS: 100.0000 mg | ORAL_CAPSULE | Freq: Three times a day (TID) | ORAL | Status: DC | PRN

## 2016-01-03 MED ORDER — OXYCODONE-ACETAMINOPHEN 5-325 MG PO TABS: 1.0000 | ORAL_TABLET | ORAL | Status: DC | PRN

## 2016-01-03 NOTE — Assessment & Plan Note (Signed)
BP Readings from Last 3 Encounters:  01/03/16 157/72  11/08/15 134/57  10/18/15 158/63    Lab Results  Component Value Date   NA 145 01/31/2015   K 3.8 01/31/2015   CREATININE 0.68 01/31/2015    Assessment: Blood pressure control:  Uncontrolled Progress toward BP goal:   Deteriorated Comments: Patient has been compliant with amlodipine 10 mg daily, atenolol 100 mg daily, but admits to not taking her lasix due to increase urination. Increased urination is likely due to increased po intake as she is not having dysuria and her diabetes is well controlled.   Plan: Medications:  continue current medications Other plans: Encouraged to restart her lasix, even at half dose (40 mg daily) and watch her po intake. She will follow up in one month

## 2016-01-03 NOTE — Assessment & Plan Note (Signed)
Patient presents with 2-3 week history of productive cough of tan sputum. Cough is not associated with fever, chills, nasal congestion, sore throat, sinus pressure, shortness of breath, wheezing, nausea, vomiting or diarrhea. She has tried mucinex without relief. Patient was seen for a cough 2-3 months ago which she states completely resolved and then this cough started. She does have a history of COPD, but does not normally have a cough or produce sputum at baseline. Patient looks comfortable on exam, afebrile, satting 95% on room air, lungs are clear. Cough is likely related to a viral URI. I do not believe she has a COPD exacerbation   Plan: -Robitussin DM Q4H prn cough -Tessalon pearls

## 2016-01-03 NOTE — Progress Notes (Signed)
Health Coaching Progress note:  Start time: 1030 End time: [id number] last visit 11-08-15  Jaynee is here today for health coaching for her depression. Today she rates her depression control as 3/10. She is caught between "helping" two adult sons and now a "grandson" and helping herself. She is still thinking about  attending [employer], getting a job and trying to find ways to be with people.  Patient Identified Concern:  Depression- feels well 0/7 days , but says she is not suicidal Stage of Change Patient Is In:  Contemplating/preparation per patient, however she is making changes Patient Reported Barriers:  "Her mind". Feelings of responsibility, feels she should be able to take acre of herself and others as well, depression, loss, fear, pain, worthlessness Patient Reported Perceived Benefits:  Having a bad day today, so few.  Patient Reports Self-Efficacy:  " Less confident" per patient Behavior Change Supports: 'nobody'.  reports her father helps her with transportation, loans her money Goals:  Get back to being me- responsible, indepent, taking care of myself.  Patient Education:   Patient aware that her glaucoma may be vision threatening and says she will kill herself if she loses her vision. number for social workers for services of the blind to see if they have funds to assist her with copays to her eye doctor provided and she agreed to call them.   Follow up : 2 weeks by phone, 4 weeks in office. Will encourage her to Consider a depression support group. They are often free. Will look into seeing if there are any online or on the phone. Will also consider homework assignements

## 2016-01-03 NOTE — Assessment & Plan Note (Signed)
Ms Lanasa here to see Dr Marvel Plan for acute issue but I addressed her chronic pain. Pt asked Dr Marvel Plan for early refill. I clarified with pt that she has pills - just wants paper Rx today since she has transportation issues. She had been on 5 mg hydrocodone and got #360 per month for 60 mg hydrocodone per day. At her last appt with me, I changed her to 10 mg tabs, #60 per 30 day, or 50 mg per day. In retrospect, that was not the best change. First it was a 16% decrease and it will be easier to taper while she is on 5 mg pills. When I spoke to her, she did not c/o increased pain or uncontrolled pain despite the 16% decrease. She did like the 5 mg better bc she got more pills. Therefore, I am changing to 5 mg pills, #300 per 30 days, or 50 mg per month. I am not tapering her down this refill bc I tapered too quickly last time. At next refill in 3 months, I will decrease to #270 pills per 30 days, 9 per day, or 45 mg per day of hydrocodone - a 10% decrease. I am decreasing her very slowly bc her mental health issues / depression / lack of social support / lack of social activities is the biggest obstacle and that is not an easy fix. Pt handed 3 paper Rx.

## 2016-01-03 NOTE — Patient Instructions (Signed)
FOR YOUR COUGH: -TAKE ROBITUSSIN EVERY 4 HOURS -TAKE TESSALON THREE TIMES A DAY AS NEEDED -INCREASE YOUR FLONASE TO 2 SPRAYS EACH NOSTRIL  FOR YOUR INCREASED URINATION: -TRY TO CUT BACK ON FLUIDS -CUT YOUR LASIX IN HALF TO AT LEAST TAKE 40 MG IN THE MORNING -FOLLOW UP IN ONE MONTH

## 2016-01-03 NOTE — Progress Notes (Signed)
Subjective:    [name], female    DOB: 01-Aug-1961, 54 y.o.   MRN: [id number]  HPI Jennifer [name] is a 55 y.o. female with PMHx of chronic dCHF, HTN, T2DM, BKA, COPD who presents to the clinic for cough. Please see A&P for the status of the patient's chronic medical problems.   Past Medical History  Diagnosis Date  . Depression   . GERD (gastroesophageal reflux disease)   . Hyperlipidemia   . Hypertension   . Glaucoma   . PVD (peripheral vascular disease) with claudication ([place])     Stents to bilateral common iliac arteries (left 2005, right 2008), on chronic plavix  . Hyperplastic colon polyp 12/2010    Per colonoscopy (12/2010) - Dr. Deatra Ina  . Asthma   . COPD 01/08/2007    PFT's 05/2007 : FEV1/FVC 82, FEV1 64% pred, FEF 25-75% 40% predicted, 16% improvement in FEV1 with bronchodilators.     . Tobacco abuse   . Chronic osteomyelitis of foot (HCC)     chronic, right secondary to diabetic foot ulcers  . Polymicrobial bacterial infection 01/2013    GBS and S. aureus bacteremia // Source likely infected diabetic foot ulcer  . 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 Blood cultures 01/26/13 S. Aureus and GBS. Blood cultures Feb 6th, 8th, and 9th and March were negative.Repeat TEE 3/20 negative for vegitations  . 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 Chronic diastolic heart failure (HCC)     grade 2 per 2D echocardiogram (01/2013)  . Ulcer of foot, chronic (HCC)     Left. No OM per MRI (01/2013)  . DVT of upper extremity (deep vein thrombosis) ([place]) 03/11/2013    Secondary to PICC line. Right brachial vein, diagnosed on 03/10/2013 Coumadin for 3 months. End date 06/10/2013   . Chronic pain syndrome 12/03/2011    Likely secondary to depression, "fibromyalgia", neuropathy, and obesity. Lumbar MRI 2014 no sig change from prior (2008) : Stable hypertrophic facet disease most notable at L4-5.  Stable shallow left foraminal/extraforaminal disc protrusion at L4-5. No direct neural compression.      . Environmental allergies     Hx: of  . Fatty liver 2003    observed on ultrasound abdomen  . CHF (congestive heart failure) ([place])   . Anginal pain [facility])     '3' of 10 ischemia ruled out 9/9   . Fibromyalgia   . Chronic bronchitis ([place])     "I get it alot" (09/28/2013)  . Exertional shortness of breath   . Rheumatoid arthritis ([place])   . Arthritis of lumbar spine   . Chronic lower back pain   . Diabetic peripheral neuropathy ([place])   . Type II diabetes mellitus with peripheral circulatory disorders, uncontrolled DX: 1993    Insulin dep. Poor control. Complicated by diabetic foot ulcer and diabetic eye disease.    . 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 2/2 tobacco use, DM, and cont weight bearing on surgical wound and developed gangrene     Outpatient Encounter Prescriptions as of 01/03/2016  Medication Sig  . ADVAIR DISKUS 250-50 MCG/DOSE AEPB TAKE 1 INHALATION BY MOUTH TWICE DAILY  . albuterol (PROVENTIL) (2.5 MG/3ML) 0.083% nebulizer solution INHALE THE CONTENTS OF 1 VIAL VIA NEBULIZER EVERY 6 HOURS AS NEEDED FOR WHEEZING  . amLODipine (NORVASC)  10 MG tablet Take 1 tablet (10 mg total) by mouth daily.  Marland Kitchen atenolol (TENORMIN) 100 MG tablet TAKE 1 TABLET BY MOUTH ONCE DAILY  . atorvastatin (LIPITOR) 80 MG tablet Take 1 tablet (80 mg total) by mouth daily.  . baclofen (LIORESAL) 10 MG tablet TAKE 1 TABLET BY MOUTH THREE TIMES A DAY AS NEEDED  . benazepril (LOTENSIN) 40 MG tablet TAKE 1 TABLET BY MOUTH DAILY  . benzonatate (TESSALON) 100 MG capsule Take 1 capsule (100 mg total) by mouth 3 (three) times daily as needed for cough.  . clotrimazole-betamethasone (LOTRISONE) cream Apply to affected area under breasts 2 times daily  . diphenhydrAMINE (BENADRYL) 25 MG tablet Take 25 mg by mouth every 6 (six) hours.   Marland Kitchen  EASY TOUCH PEN NEEDLES 31G X 8 MM MISC USE TO INJECT INSULIN TWICE DAILY  . fluticasone (FLONASE) 50 MCG/ACT nasal spray PLACE 1 SPRAY INTO BOTH NOSTRILS DAILY  . furosemide (LASIX) 80 MG tablet TAKE 1 TABLET BY MOUTH EVERY DAY  . guaiFENesin-dextromethorphan (ROBITUSSIN DM) 100-10 MG/5ML syrup Take 5 mLs by mouth every 4 (four) hours as needed for cough.  Marland Kitchen HUMALOG MIX 75/25 KWIKPEN (75-25) 100 UNIT/ML Kwikpen INJECT 135 UNITS SUBCUTANEOUSLY IN THE MORNING BEFORE BREAKFAST & INJECT 70 UNITS SUBCUTANEOUSLY IN THE EVENING BEFORE EVENING MEAL  . ipratropium (ATROVENT) 0.02 % nebulizer solution USE 1 VIAL VIA NEBULIZER EVERY 6 HOURS AS NEEDED FOR WHEEZING  . lidocaine (LIDODERM) 5 % Place 1 patch onto the skin daily. Remove & Discard patch within 12 hours or as directed by MD  . LYRICA 200 MG capsule TAKE 1 CAPSULE BY MOUTH THREE TIMES A DAY  . Neomycin-Bacitracin-Polymyxin (NEOSPORIN ORIGINAL) [phone] OINT Apply to spot on neck twice a day  . nystatin (MYCOSTATIN) powder APPLY TOPICALLY THREE TIMES A DAY  . ONETOUCH DELICA LANCETS [id number] MISC USE TO CHECK BLOOD SUGARS FOUR TIMES A DAY (Patient not taking: Reported on 10/04/2015)  . ONETOUCH VERIO test strip USE 3-4 TIMES DAILY TO CHECK BLOOD SUGAR  . oxyCODONE-acetaminophen (PERCOCET/ROXICET) 5-325 MG tablet Take 1-2 tablets by mouth every 4 (four) hours as needed for severe pain. #300 equals a 1 month supply  . potassium chloride SA (K-DUR,KLOR-CON) 20 MEQ tablet TAKE 2 TABLETS BY MOUTH DAILY  . PROAIR HFA 108 (90 BASE) MCG/ACT inhaler INHALE 2 PUFFS BY MOUTH EVERY 6 HOURS AS NEEDED FOR WHEEZING  . ranitidine (ZANTAC) 150 MG tablet Take 1 tablet (150 mg total) by mouth 2 (two) times daily.  . rosuvastatin (CRESTOR) 20 MG tablet Take 1 tablet (20 mg total) by mouth at bedtime.  . sertraline (ZOLOFT) 100 MG tablet Take 1 tablet (100 mg total) by mouth daily.  . travoprost, benzalkonium, (TRAVATAN) 0.004 % ophthalmic solution Place 1 drop into both  eyes at bedtime.   . traZODone (DESYREL) 100 MG tablet TAKE 1 TABLET BY MOUTH DAILY AT BEDTIME AS NEEDED FOR SLEEP  . [DISCONTINUED] benzonatate (TESSALON) 100 MG capsule Take 1 capsule (100 mg total) by mouth 3 (three) times daily as needed for cough.  . [DISCONTINUED] oxyCODONE-acetaminophen (PERCOCET) 10-325 MG per tablet Take up to 5 pills a day, at least 4 hours apart.  . [DISCONTINUED] oxyCODONE-acetaminophen (PERCOCET/ROXICET) 5-325 MG tablet Take 1-2 tablets by mouth every 4 (four) hours as needed for severe pain. #300 equals a 1 month supply  . [DISCONTINUED] oxyCODONE-acetaminophen (PERCOCET/ROXICET) 5-325 MG tablet Take 1-2 tablets by mouth every 4 (four) hours as needed for severe pain. #300 equals a 1 month  supply   No facility-administered encounter medications on file as of 01/03/2016.    Family History  Problem Relation Age of Onset  . Diverticulosis Mother   . Diabetes Mother   . Hypertension Mother   . Congestive Heart Failure Mother   . Asthma Father   . CAD Sister 61    MI at age 73 per patient.  However, she has not had a stent or CABG.   . Heart disease Sister     before age 4    Social History   Social History  . Marital Status: Single    Spouse Name: N/A  . Number of Children: 2  . Years of Education: college   Occupational History  . Disability     previously worked as a IT sales professional History Main Topics  . Smoking status: Current Every Day Smoker -- 1.00 packs/day for 44 years    Types: Cigarettes    Last Attempt to Quit: 10/03/2013  . Smokeless tobacco: Never Used     Comment: Cutting back.  . Alcohol Use: No  . Drug Use: No     Comment: 09/28/2013 "no marijuana since 2011, no crack/cocaine 1989"  . Sexual Activity: Not Currently   Other Topics Concern  . Not on file   Social History Narrative   On disability. Lives with son in [place]. Formerly worked as Training and development officer.    Boyfriend passed away stage 4 cancer 2013/03/21.   S/p L BKA 2014. In  wheelchair in paritially suitable apartment.     Review of Systems General: Denies fever, chills, fatigue, and diaphoresis.  Respiratory: Admits to productive cough. Denies SOB, chest tightness, and wheezing.   Cardiovascular: Denies chest pain and palpitations.  Gastrointestinal: Denies nausea, vomiting, abdominal pain, diarrhea.  Genitourinary: Admits to increased urination. Denies dysuria, urgency, hematuria, suprapubic pain Neurological: Denies dizziness, headaches, weakness, lightheadedness     Objective:   Physical Exam Filed Vitals:   01/03/16 0949  BP: 157/72  Pulse: 88  Temp: 97.8 F (36.6 C)  TempSrc: Oral  Weight: 275 lb 6.4 oz (124.921 kg)  SpO2: 95%   General: Vital signs reviewed.  Patient is chronically ill-appearing, in no acute distress and cooperative with exam.   HEENT: Erythematous, edematous nasal turbinates. No sinus tenderness on palpation. Normal posterior oropharynx. Cardiovascular: RRR, S1 normal, S2 normal. Pulmonary/Chest: Clear to auscultation bilaterally, no wheezes, rales, or rhonchi. Abdominal: Soft, non-tender, non-distended, BS +.  Extremities: S/p L BKA. Trace lower extremity edema on right.     Assessment & Plan:   Please see problem based assessment and plan.

## 2016-01-03 NOTE — Progress Notes (Signed)
Medicine attending: Medical history, presenting problems, physical findings, and medications, reviewed with resident physician Dr Alexa Richardson on the day of the patient visit and I concur with her evaluation and management plan. 

## 2016-01-03 NOTE — Patient Instructions (Signed)
Call me when you are ready to meet again.   I will call you in 2 weeks.

## 2016-01-31 ENCOUNTER — Ambulatory Visit: Payer: Medicare Other | Admitting: Dietician

## 2016-01-31 ENCOUNTER — Encounter: Payer: Self-pay | Admitting: Internal Medicine

## 2016-01-31 ENCOUNTER — Encounter: Payer: Medicare Other | Admitting: Internal Medicine

## 2016-02-01 ENCOUNTER — Ambulatory Visit: Payer: Medicare Other | Admitting: Physical Medicine & Rehabilitation

## 2016-02-05 ENCOUNTER — Other Ambulatory Visit: Payer: Self-pay | Admitting: Internal Medicine

## 2016-02-06 NOTE — Telephone Encounter (Signed)
Removed from profile per [place].

## 2016-02-06 NOTE — Telephone Encounter (Signed)
Would you pls ask the pharmacy to remove Abilify from her med list and to stop sending me refill requests?

## 2016-02-11 ENCOUNTER — Ambulatory Visit: Payer: Medicare Other | Admitting: Physical Medicine & Rehabilitation

## 2016-02-11 ENCOUNTER — Encounter: Payer: Medicare Other | Attending: Physical Medicine & Rehabilitation

## 2016-02-28 ENCOUNTER — Encounter: Payer: Self-pay | Admitting: Internal Medicine

## 2016-02-28 ENCOUNTER — Ambulatory Visit (INDEPENDENT_AMBULATORY_CARE_PROVIDER_SITE_OTHER): Payer: Medicare Other | Admitting: Internal Medicine

## 2016-02-28 VITALS — BP 162/83 | HR 79 | Temp 97.7°F | Wt 275.5 lb

## 2016-02-28 DIAGNOSIS — I700 Atherosclerosis of aorta: Secondary | ICD-10-CM

## 2016-02-28 DIAGNOSIS — Z89512 Acquired absence of left leg below knee: Secondary | ICD-10-CM

## 2016-02-28 DIAGNOSIS — F17210 Nicotine dependence, cigarettes, uncomplicated: Secondary | ICD-10-CM

## 2016-02-28 DIAGNOSIS — I152 Hypertension secondary to endocrine disorders: Secondary | ICD-10-CM | POA: Diagnosis not present

## 2016-02-28 DIAGNOSIS — E1151 Type 2 diabetes mellitus with diabetic peripheral angiopathy without gangrene: Secondary | ICD-10-CM | POA: Diagnosis not present

## 2016-02-28 DIAGNOSIS — G250 Essential tremor: Secondary | ICD-10-CM

## 2016-02-28 DIAGNOSIS — I5032 Chronic diastolic (congestive) heart failure: Secondary | ICD-10-CM

## 2016-02-28 DIAGNOSIS — F332 Major depressive disorder, recurrent severe without psychotic features: Secondary | ICD-10-CM

## 2016-02-28 DIAGNOSIS — IMO0002 Reserved for concepts with insufficient information to code with codable children: Secondary | ICD-10-CM

## 2016-02-28 DIAGNOSIS — Z794 Long term (current) use of insulin: Secondary | ICD-10-CM

## 2016-02-28 DIAGNOSIS — E1165 Type 2 diabetes mellitus with hyperglycemia: Secondary | ICD-10-CM | POA: Diagnosis not present

## 2016-02-28 DIAGNOSIS — Z79899 Other long term (current) drug therapy: Secondary | ICD-10-CM

## 2016-02-28 DIAGNOSIS — E1159 Type 2 diabetes mellitus with other circulatory complications: Secondary | ICD-10-CM | POA: Diagnosis not present

## 2016-02-28 DIAGNOSIS — F1120 Opioid dependence, uncomplicated: Secondary | ICD-10-CM

## 2016-02-28 DIAGNOSIS — I110 Hypertensive heart disease with heart failure: Secondary | ICD-10-CM

## 2016-02-28 DIAGNOSIS — Z6841 Body Mass Index (BMI) 40.0 and over, adult: Secondary | ICD-10-CM

## 2016-02-28 DIAGNOSIS — I10 Essential (primary) hypertension: Secondary | ICD-10-CM

## 2016-02-28 LAB — GLUCOSE, CAPILLARY: Glucose-Capillary: 112 mg/dL — ABNORMAL HIGH (ref 65–99)

## 2016-02-28 LAB — POCT GLYCOSYLATED HEMOGLOBIN (HGB A1C): Hemoglobin A1C: 8.4

## 2016-02-28 MED ORDER — OXYCODONE-ACETAMINOPHEN 5-325 MG PO TABS: 1.0000 | ORAL_TABLET | ORAL | Status: DC | PRN

## 2016-02-28 MED ORDER — FUROSEMIDE 40 MG PO TABS: 40.0000 mg | ORAL_TABLET | Freq: Every day | ORAL | Status: DC | PRN

## 2016-02-28 MED ORDER — HYDROCHLOROTHIAZIDE 25 MG PO TABS: 25.0000 mg | ORAL_TABLET | Freq: Every day | ORAL | Status: DC

## 2016-02-28 NOTE — Assessment & Plan Note (Addendum)
She is on norvasc 10, atenolol 100, lotensin 40, and lasix which she takes oly 3-4 times per week bc of urinary freq. Her GFR is nl. She had been on HCTZ which she states did not make her pee like the lasix. She was changed to lasix in May [id number] for diastolic HR exac. She is not complaint with the lasix 2/2 urinary freq and her BP is elevated and her Cr is OK. Therefore, change back to HCTZ.   BP Readings from Last 3 Encounters:  02/28/16 162/83  01/03/16 157/72  11/08/15 134/57   PLAN Lasix changed to PRN Educated her to checked weights daily HCTZ 25 QD BMP June appt

## 2016-02-28 NOTE — Assessment & Plan Note (Signed)
Weight is overall table since 2014. She is going to try to decrease Sprite usage. Her depression, pain, and overall poor outlook is preventing success.  PLAN : encouragement

## 2016-02-28 NOTE — Assessment & Plan Note (Addendum)
In 2014, after her BKA, she was on #240 oxycodone 5 mg or 8 per day. She then went to SNF and apparently was getting 12 per day. Her dose was then temp increased to 360 01/20/14 but so that she could participate in PT. I cont that until mid 2016. She has not made any gains in terms of functional independence, PT, nor walking with prosthesis. She was then started on a slow taper - 10 % ever 3 months. She was decreased from 360 to 300 - an error on my part bc I miscalculated her original dose but she did fine on the 300 per 30 day or 10 per day. I then kept her dose steady from Aug to today (March). I am now decreasing 10% from 300 to 270 (from 10 to 9 per day or from 50 mg to 45 mg daily). She was upset at this and states there is not way she can get by on just 9 per day. I have no doubt that she is in pain. But she cancelled 2 appts and no showed one appt with PMR to get to bottom of R hip pain. She states she does not have the $50 co pay to go to the appt. Bc she np showed an appt (she was sleeping and didn't wake up until night) she was charged a $50 fee and her co-pay. And she states she doesn't have the money to go to the pysch. But, her two sons, one son's girlfriend, and that girlfriend's kids are living with her but not contributing to her utilities, rent, groceries, etc. I talked to her that she needs to put her medical needs first and kick her kids out (they are grown up and are increasing her utilities) or they need to contribute to household expenses. I explained that I could not simply cont this medicine without getting to the root cause and I needed help from PMR (R hip pain) and pysch as I think her mental illness is really exacerbating the pain and if I can figure out what is causing her hip pain there might be a specific tx for that. She has not yet been able to embrace taking responsibility for her health care and meet her expectations. I listed the reasons for her taper in the overview.  PLAN  : Oxycodone 5 mg #270 per 30 days 3 paper copies given Will likely keep quantity at 270 for subsequent 3 months Asked her to call me once able to make appt for PMR and pysch No new contract bc the quantity is in flux I offered Nancan - her response "just another copay"

## 2016-02-28 NOTE — Assessment & Plan Note (Signed)
This was noted on CT in 2007. She has not been successful stopping cigarettes. She is on statin. She is not able to exercise.  PLAN : cont statin. Cont to encourage smoking cessation.

## 2016-02-28 NOTE — Assessment & Plan Note (Signed)
She c/o same tremor sxs as last time and what sounds like myoclonus. There are a lot of causes for myoclonus - electrolyte abnl, Vit E def, Narcotics, TCA, SSRI. I will check labs at F/U appt and cont to taper narcotics. I do not want to stop Zoloft at this time though. Offer referral to neuro but doubt she will go.

## 2016-02-28 NOTE — Assessment & Plan Note (Signed)
See opioid dependence.  Currently on Zoloft 100 and trazodone 100. Has been on lexapro, wellbutrin, abilify, and elavil in past. States none working. Her PHQ9 score though has decreased from 21 in 10/16 to 13 to 9 today. She states she cannot afford the copay for a pysch appt.

## 2016-02-28 NOTE — Assessment & Plan Note (Signed)
She had on her prosthesis today. Only able to stand < 5 min and take a few steps. She really did not excel after amputation and PT.  PLAN : encouragement

## 2016-02-28 NOTE — Progress Notes (Signed)
   Subjective:    [name], female    DOB: [birth year], 55 y.o.   MRN: [id number]  HPI  Jennifer [name] is here for pain F/U. Please see the A&P for the status of the pt's chronic medical problems.  ROS : per ROS section and in problem oriented charting. All other systems are negative. PMHx, Soc hx, and / or Fam hx : She overslept her last appt and no showed. In WC 2/2 leg amputation.   Review of Systems  Constitutional: Negative for activity change, appetite change and unexpected weight change.  Endocrine:       Hypoglycemic sxs once  Musculoskeletal: Positive for myalgias, back pain, arthralgias and gait problem.  Neurological: Positive for tremors and weakness.  Psychiatric/Behavioral: Positive for sleep disturbance and dysphoric mood.       Objective:   Physical Exam  Constitutional: She appears well-developed and well-nourished. No distress.  HENT:  Head: Normocephalic and atraumatic.  Right Ear: External ear normal.  Left Ear: External ear normal.  Nose: Nose normal.  Eyes: Conjunctivae are normal. Right eye exhibits no discharge. Left eye exhibits no discharge. No scleral icterus.  Neurological: She is alert.  Skin: She is not diaphoretic.  Psychiatric: She has a normal mood and affect. Her behavior is normal. Judgment and thought content normal.  Neat appearance - make up and hair done. Tidy clothes. Good eye contact. Freq talks over me. Says same things appt to appt          Assessment & Plan:

## 2016-02-28 NOTE — Assessment & Plan Note (Signed)
A1C deteriorated from 7.3 to 8.4 No meter today. States checks it 2 or 3 times daily. One episode of symptomatic hypoglycemia to 48. Drinking 2 L of Sprite a day. Wants to cut down on Sprite to get A1C < 7. Using 70/30 135 units in AM and 85 in PM.  PLAN : F/U 3 months.  Really need to get depression tx

## 2016-02-28 NOTE — Assessment & Plan Note (Signed)
I do not recall any other vol overload except May 2014 when she was admitted for asthma exac and tx with steroids which might have been trigger for vol overload. She is not able to take the lasix. Therefore, will change it to PRN and encourage her to check weights daily.  PLAN : change lasix to PRN

## 2016-02-28 NOTE — Patient Instructions (Signed)
See me in 3 months When you are ready to sch an appt with the depression doctor and pain doctor, let me know I gave you three paper prescriptions for your pain medicine

## 2016-02-29 ENCOUNTER — Other Ambulatory Visit: Payer: Self-pay | Admitting: Internal Medicine

## 2016-02-29 ENCOUNTER — Ambulatory Visit: Payer: Medicare Other | Admitting: Physical Medicine & Rehabilitation

## 2016-02-29 NOTE — Telephone Encounter (Signed)
Last appt 02/28/16. Next appt 05/22/16.

## 2016-03-04 ENCOUNTER — Other Ambulatory Visit: Payer: Self-pay | Admitting: Internal Medicine

## 2016-03-05 NOTE — Telephone Encounter (Signed)
Phoned to [facility]

## 2016-04-18 ENCOUNTER — Encounter: Payer: Self-pay | Admitting: Pulmonary Disease

## 2016-04-18 ENCOUNTER — Ambulatory Visit (INDEPENDENT_AMBULATORY_CARE_PROVIDER_SITE_OTHER): Payer: Medicare Other | Admitting: Pulmonary Disease

## 2016-04-18 VITALS — BP 177/86 | HR 88 | Temp 98.4°F | Ht 67.0 in | Wt 276.4 lb

## 2016-04-18 DIAGNOSIS — Z89512 Acquired absence of left leg below knee: Secondary | ICD-10-CM | POA: Diagnosis not present

## 2016-04-18 DIAGNOSIS — E1151 Type 2 diabetes mellitus with diabetic peripheral angiopathy without gangrene: Secondary | ICD-10-CM | POA: Diagnosis not present

## 2016-04-18 DIAGNOSIS — E11621 Type 2 diabetes mellitus with foot ulcer: Secondary | ICD-10-CM

## 2016-04-18 DIAGNOSIS — L97519 Non-pressure chronic ulcer of other part of right foot with unspecified severity: Secondary | ICD-10-CM

## 2016-04-18 DIAGNOSIS — I798 Other disorders of arteries, arterioles and capillaries in diseases classified elsewhere: Secondary | ICD-10-CM

## 2016-04-18 NOTE — Patient Instructions (Signed)
Please see your wound care clinic. Please follow up with your primary care physician as previously recommended. Call our clinic if the wound gets worse (increased redness, warmth, fevers, chills, vomiting)

## 2016-04-18 NOTE — Progress Notes (Signed)
Subjective:    [name], female    DOB: 12-26-1960, 55 y.o.   MRN: [id number]  HPI Jennifer [name] is a 55 year old woman with history of PVD, DM presenting for evaluation of R toe ulcer.  Last Friday first noticed round blister on lateral right small toe. Saturday got larger. Yellow pus and blood came out. No erythema. No warmth. Has not been to wound care in a while. Used silvadene on the wound. Used Santyl as well.   Review of Systems Constitutional: no fevers/chills Respiratory: no shortness of breath Cardiovascular: no chest pain Gastrointestinal: no nausea/vomiting, no abdominal pain, no constipation, no diarrhea  Past Medical History  Diagnosis Date  . Depression   . GERD (gastroesophageal reflux disease)   . Hyperlipidemia   . Hypertension   . Glaucoma   . PVD (peripheral vascular disease) with claudication ([place])     Stents to bilateral common iliac arteries (left 2005, right 2008), on chronic plavix  . Hyperplastic colon polyp 12/2010    Per colonoscopy (12/2010) - Dr. Deatra Ina  . Asthma   . COPD 01/08/2007    PFT's 05/2007 : FEV1/FVC 82, FEV1 64% pred, FEF 25-75% 40% predicted, 16% improvement in FEV1 with bronchodilators.     . Tobacco abuse   . Chronic osteomyelitis of foot (HCC)     chronic, right secondary to diabetic foot ulcers  . Polymicrobial bacterial infection 01/2013    GBS and S. aureus bacteremia // Source likely infected diabetic foot ulcer  . 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 Blood cultures 01/26/13 S. Aureus and GBS. Blood cultures Feb 6th, 8th, and 9th and March were negative.Repeat TEE 3/20 negative for vegitations  . 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 Chronic diastolic heart failure (HCC)     grade 2 per 2D echocardiogram (01/2013)  . Ulcer of foot, chronic (HCC)     Left. No OM per MRI (01/2013)  . DVT of upper extremity (deep vein thrombosis)  ([place]) 03/11/2013    Secondary to PICC line. Right brachial vein, diagnosed on 03/10/2013 Coumadin for 3 months. End date 06/10/2013   . Chronic pain syndrome 12/03/2011    Likely secondary to depression, "fibromyalgia", neuropathy, and obesity. Lumbar MRI 2014 no sig change from prior (2008) : Stable hypertrophic facet disease most notable at L4-5. Stable shallow left foraminal/extraforaminal disc protrusion at L4-5. No direct neural compression.      . Environmental allergies     Hx: of  . Fatty liver 2003    observed on ultrasound abdomen  . CHF (congestive heart failure) ([place])   . Anginal pain [facility])     '3' of 10 ischemia ruled out 9/9   . Fibromyalgia   . Chronic bronchitis ([place])     "I get it alot" (09/28/2013)  . Exertional shortness of breath   . Rheumatoid arthritis ([place])   . Arthritis of lumbar spine   . Chronic lower back pain   . Diabetic peripheral neuropathy ([place])   . Type II diabetes mellitus with peripheral circulatory disorders, uncontrolled DX: 1993    Insulin dep. Poor control. Complicated by diabetic foot ulcer and diabetic eye disease.    . 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 2/2 tobacco use, DM, and cont weight bearing on surgical wound and developed gangrene  Current Outpatient Prescriptions on File Prior to Visit  Medication Sig Dispense Refill  . ADVAIR DISKUS 250-50 MCG/DOSE AEPB TAKE 1 INHALATION BY MOUTH TWICE DAILY 180 each 3  . albuterol (PROVENTIL) (2.5 MG/3ML) 0.083% nebulizer solution INHALE THE CONTENTS OF 1 VIAL VIA NEBULIZER EVERY 6 HOURS AS NEEDED FOR WHEEZING 360 mL 2  . amLODipine (NORVASC) 10 MG tablet Take 1 tablet (10 mg total) by mouth daily. 30 tablet 11  . atenolol (TENORMIN) 100 MG tablet TAKE 1 TABLET BY MOUTH ONCE DAILY 90 tablet 3  . atorvastatin (LIPITOR) 80 MG tablet Take 1 tablet (80 mg total) by mouth daily. 90 tablet 3  . baclofen (LIORESAL) 10 MG tablet  TAKE 1 TABLET BY MOUTH THREE TIMES A DAY AS NEEDED 90 each 5  . benazepril (LOTENSIN) 40 MG tablet TAKE 1 TABLET BY MOUTH DAILY 90 tablet 3  . benzonatate (TESSALON) 100 MG capsule Take 1 capsule (100 mg total) by mouth 3 (three) times daily as needed for cough. 28 capsule 0  . clotrimazole-betamethasone (LOTRISONE) cream Apply to affected area under breasts 2 times daily 15 g 0  . diphenhydrAMINE (BENADRYL) 25 MG tablet Take 25 mg by mouth every 6 (six) hours.     Marland Kitchen EASY TOUCH PEN NEEDLES 31G X 8 MM MISC USE TO INJECT INSULIN TWICE DAILY 100 each 10  . fluticasone (FLONASE) 50 MCG/ACT nasal spray PLACE 1 SPRAY INTO BOTH NOSTRILS DAILY 48 g 11  . furosemide (LASIX) 40 MG tablet Take 1 tablet (40 mg total) by mouth daily as needed. 30 tablet 2  . guaiFENesin-dextromethorphan (ROBITUSSIN DM) 100-10 MG/5ML syrup Take 5 mLs by mouth every 4 (four) hours as needed for cough. 118 mL 0  . HUMALOG MIX 75/25 KWIKPEN (75-25) 100 UNIT/ML Kwikpen INJECT 135 UNITS SUBCUTANEOUSLY IN THE MORNING BEFORE BREAKFAST & INJECT 70 UNITS SUBCUTANEOUSLY IN THE EVENING BEFORE EVENING MEAL 75 mL 5  . hydrochlorothiazide (HYDRODIURIL) 25 MG tablet Take 1 tablet (25 mg total) by mouth daily. 30 tablet 3  . ipratropium (ATROVENT) 0.02 % nebulizer solution USE 1 VIAL VIA NEBULIZER EVERY 6 HOURS AS NEEDED FOR WHEEZING 75 mL 5  . lidocaine (LIDODERM) 5 % Place 1 patch onto the skin daily. Remove & Discard patch within 12 hours or as directed by MD 30 patch 0  . LYRICA 200 MG capsule TAKE 1 CAPSULE BY MOUTH THREE TIMES A DAY 90 capsule 5  . Neomycin-Bacitracin-Polymyxin (NEOSPORIN ORIGINAL) [phone] OINT Apply to spot on neck twice a day 14.2 g 0  . nystatin (MYCOSTATIN) powder APPLY TOPICALLY THREE TIMES A DAY 60 g 5  . ONETOUCH DELICA LANCETS [id number] MISC USE TO CHECK BLOOD SUGARS FOUR TIMES A DAY (Patient not taking: Reported on 10/04/2015) 100 each 11  . ONETOUCH VERIO test strip USE 3-4 TIMES DAILY TO CHECK BLOOD SUGAR 100  each 11  . oxyCODONE-acetaminophen (PERCOCET/ROXICET) 5-325 MG tablet Take 1-2 tablets by mouth every 4 (four) hours as needed for severe pain. #270 equals a 1 month supply 270 tablet 0  . potassium chloride SA (K-DUR,KLOR-CON) 20 MEQ tablet TAKE 2 TABLETS BY MOUTH DAILY 180 tablet 1  . PROAIR HFA 108 (90 BASE) MCG/ACT inhaler INHALE 2 PUFFS BY MOUTH EVERY 6 HOURS AS NEEDED FOR WHEEZING 8.5 g 5  . ranitidine (ZANTAC) 150 MG tablet Take 1 tablet (150 mg total) by mouth 2 (two) times daily. 180 tablet 3  . rosuvastatin (CRESTOR) 20 MG tablet Take 1 tablet (20 mg total) by mouth  at bedtime. 30 tablet 11  . sertraline (ZOLOFT) 100 MG tablet TAKE 1/2 TABLET BY MOUTH DAILY 30 tablet 5  . travoprost, benzalkonium, (TRAVATAN) 0.004 % ophthalmic solution Place 1 drop into both eyes at bedtime.     . traZODone (DESYREL) 100 MG tablet TAKE 1 TABLET BY MOUTH DAILY AT BEDTIME AS NEEDED FOR SLEEP 90 tablet 3  . [DISCONTINUED] Potassium Chloride ER 20 MEQ TBCR Take 40 mEq by mouth daily. 30 tablet 0   No current facility-administered medications on file prior to visit.      Objective:   Physical Exam Blood pressure 177/86, pulse 88, temperature 98.4 F (36.9 C), temperature source Oral, height 5\' 7"  (1.702 m), weight 276 lb 6.4 oz (125.374 kg), SpO2 97 %.  General Apperance: NAD HEENT: Normocephalic, atraumatic, anicteric sclera Neck: Supple, trachea midline Lungs: Clear to auscultation bilaterally.  Heart: Regular rate and rhythm Abdomen: Soft, nondistended Extremities: L BKA, R fifth toe latearal aspect with callus and 1cm ulceration, no surrounding erythema, no drainage Neurologic: Alert and interactive. No gross deficits.     Assessment & Plan:  Please refer to problem based charting.

## 2016-04-20 NOTE — Assessment & Plan Note (Signed)
Assessment: Ulceration on lateral aspect of right fifth toe without evidence of cellulitis. Needs aggressive local wound care as she is high risk for infection and requiring amputation.  Plan:  Home health RN for wound care Referral to wound care clinic

## 2016-04-21 NOTE — Progress Notes (Signed)
Internal Medicine Clinic Attending  Case discussed with Dr. Krall at the time of the visit.  We reviewed the resident's history and exam and pertinent patient test results.  I agree with the assessment, diagnosis, and plan of care documented in the resident's note.  

## 2016-04-23 ENCOUNTER — Telehealth: Payer: Self-pay | Admitting: Internal Medicine

## 2016-04-23 NOTE — Telephone Encounter (Signed)
Called and spoke with the patient and confirmed with [facility] to use.  Jennifer [name] is in Network with [facility] and she is okay with using them because she has used them before.  Called and Spoke with Liaison Christa See, RN @ Jennifer [name]  [phone]) to start the process for the Home Health Referral.  [name] would like to speak with you for specific [facility] Dressing changes and how often.  Please give her a call as soon as possible to start the order.

## 2016-04-24 NOTE — Telephone Encounter (Signed)
Spoke to [place]. Thanks!

## 2016-04-26 DIAGNOSIS — I159 Secondary hypertension, unspecified: Secondary | ICD-10-CM | POA: Diagnosis not present

## 2016-04-26 DIAGNOSIS — E1142 Type 2 diabetes mellitus with diabetic polyneuropathy: Secondary | ICD-10-CM | POA: Diagnosis not present

## 2016-04-26 DIAGNOSIS — I5032 Chronic diastolic (congestive) heart failure: Secondary | ICD-10-CM | POA: Diagnosis not present

## 2016-04-26 DIAGNOSIS — E1165 Type 2 diabetes mellitus with hyperglycemia: Secondary | ICD-10-CM | POA: Diagnosis not present

## 2016-04-26 DIAGNOSIS — H409 Unspecified glaucoma: Secondary | ICD-10-CM | POA: Diagnosis not present

## 2016-04-26 DIAGNOSIS — J449 Chronic obstructive pulmonary disease, unspecified: Secondary | ICD-10-CM | POA: Diagnosis not present

## 2016-04-26 DIAGNOSIS — G894 Chronic pain syndrome: Secondary | ICD-10-CM | POA: Diagnosis not present

## 2016-04-26 DIAGNOSIS — E11319 Type 2 diabetes mellitus with unspecified diabetic retinopathy without macular edema: Secondary | ICD-10-CM | POA: Diagnosis not present

## 2016-04-26 DIAGNOSIS — E1151 Type 2 diabetes mellitus with diabetic peripheral angiopathy without gangrene: Secondary | ICD-10-CM | POA: Diagnosis not present

## 2016-04-26 DIAGNOSIS — E11621 Type 2 diabetes mellitus with foot ulcer: Secondary | ICD-10-CM | POA: Diagnosis not present

## 2016-04-26 DIAGNOSIS — L97511 Non-pressure chronic ulcer of other part of right foot limited to breakdown of skin: Secondary | ICD-10-CM | POA: Diagnosis not present

## 2016-04-26 DIAGNOSIS — E1139 Type 2 diabetes mellitus with other diabetic ophthalmic complication: Secondary | ICD-10-CM | POA: Diagnosis not present

## 2016-04-26 DIAGNOSIS — E1159 Type 2 diabetes mellitus with other circulatory complications: Secondary | ICD-10-CM | POA: Diagnosis not present

## 2016-04-29 ENCOUNTER — Telehealth: Payer: Self-pay | Admitting: Internal Medicine

## 2016-04-29 DIAGNOSIS — L97511 Non-pressure chronic ulcer of other part of right foot limited to breakdown of skin: Secondary | ICD-10-CM | POA: Diagnosis not present

## 2016-04-29 DIAGNOSIS — I159 Secondary hypertension, unspecified: Secondary | ICD-10-CM | POA: Diagnosis not present

## 2016-04-29 DIAGNOSIS — I5032 Chronic diastolic (congestive) heart failure: Secondary | ICD-10-CM | POA: Diagnosis not present

## 2016-04-29 DIAGNOSIS — E1165 Type 2 diabetes mellitus with hyperglycemia: Secondary | ICD-10-CM | POA: Diagnosis not present

## 2016-04-29 DIAGNOSIS — E11319 Type 2 diabetes mellitus with unspecified diabetic retinopathy without macular edema: Secondary | ICD-10-CM | POA: Diagnosis not present

## 2016-04-29 DIAGNOSIS — E1139 Type 2 diabetes mellitus with other diabetic ophthalmic complication: Secondary | ICD-10-CM | POA: Diagnosis not present

## 2016-04-29 DIAGNOSIS — H409 Unspecified glaucoma: Secondary | ICD-10-CM | POA: Diagnosis not present

## 2016-04-29 DIAGNOSIS — E1142 Type 2 diabetes mellitus with diabetic polyneuropathy: Secondary | ICD-10-CM | POA: Diagnosis not present

## 2016-04-29 DIAGNOSIS — E11621 Type 2 diabetes mellitus with foot ulcer: Secondary | ICD-10-CM | POA: Diagnosis not present

## 2016-04-29 DIAGNOSIS — E1151 Type 2 diabetes mellitus with diabetic peripheral angiopathy without gangrene: Secondary | ICD-10-CM | POA: Diagnosis not present

## 2016-04-29 DIAGNOSIS — J449 Chronic obstructive pulmonary disease, unspecified: Secondary | ICD-10-CM | POA: Diagnosis not present

## 2016-04-29 DIAGNOSIS — G894 Chronic pain syndrome: Secondary | ICD-10-CM | POA: Diagnosis not present

## 2016-04-29 DIAGNOSIS — E1159 Type 2 diabetes mellitus with other circulatory complications: Secondary | ICD-10-CM | POA: Diagnosis not present

## 2016-04-29 NOTE — Telephone Encounter (Signed)
Vo for [facility] OK with me

## 2016-04-29 NOTE — Telephone Encounter (Signed)
[facility] calls and states she needs verb order approval for: 2x week for 8 weeks silversorb and telfa drsg to R small toe care Verbal given, do you approve?

## 2016-04-29 NOTE — Telephone Encounter (Signed)
Nurse from [facility] wants to talk to triage nurse

## 2016-05-01 ENCOUNTER — Other Ambulatory Visit: Payer: Self-pay | Admitting: Internal Medicine

## 2016-05-01 DIAGNOSIS — E1159 Type 2 diabetes mellitus with other circulatory complications: Secondary | ICD-10-CM | POA: Diagnosis not present

## 2016-05-01 DIAGNOSIS — E1165 Type 2 diabetes mellitus with hyperglycemia: Secondary | ICD-10-CM | POA: Diagnosis not present

## 2016-05-01 DIAGNOSIS — J449 Chronic obstructive pulmonary disease, unspecified: Secondary | ICD-10-CM | POA: Diagnosis not present

## 2016-05-01 DIAGNOSIS — I159 Secondary hypertension, unspecified: Secondary | ICD-10-CM | POA: Diagnosis not present

## 2016-05-01 DIAGNOSIS — L97511 Non-pressure chronic ulcer of other part of right foot limited to breakdown of skin: Secondary | ICD-10-CM | POA: Diagnosis not present

## 2016-05-01 DIAGNOSIS — E11319 Type 2 diabetes mellitus with unspecified diabetic retinopathy without macular edema: Secondary | ICD-10-CM | POA: Diagnosis not present

## 2016-05-01 DIAGNOSIS — G894 Chronic pain syndrome: Secondary | ICD-10-CM | POA: Diagnosis not present

## 2016-05-01 DIAGNOSIS — E1139 Type 2 diabetes mellitus with other diabetic ophthalmic complication: Secondary | ICD-10-CM | POA: Diagnosis not present

## 2016-05-01 DIAGNOSIS — E11621 Type 2 diabetes mellitus with foot ulcer: Secondary | ICD-10-CM | POA: Diagnosis not present

## 2016-05-01 DIAGNOSIS — E1142 Type 2 diabetes mellitus with diabetic polyneuropathy: Secondary | ICD-10-CM | POA: Diagnosis not present

## 2016-05-01 DIAGNOSIS — E1151 Type 2 diabetes mellitus with diabetic peripheral angiopathy without gangrene: Secondary | ICD-10-CM | POA: Diagnosis not present

## 2016-05-01 DIAGNOSIS — H409 Unspecified glaucoma: Secondary | ICD-10-CM | POA: Diagnosis not present

## 2016-05-01 DIAGNOSIS — I5032 Chronic diastolic (congestive) heart failure: Secondary | ICD-10-CM | POA: Diagnosis not present

## 2016-05-01 DIAGNOSIS — Q819 Epidermolysis bullosa, unspecified: Secondary | ICD-10-CM | POA: Diagnosis not present

## 2016-05-01 DIAGNOSIS — S88919A Complete traumatic amputation of unspecified lower leg, level unspecified, initial encounter: Secondary | ICD-10-CM | POA: Diagnosis not present

## 2016-05-06 DIAGNOSIS — E1139 Type 2 diabetes mellitus with other diabetic ophthalmic complication: Secondary | ICD-10-CM | POA: Diagnosis not present

## 2016-05-06 DIAGNOSIS — E11319 Type 2 diabetes mellitus with unspecified diabetic retinopathy without macular edema: Secondary | ICD-10-CM | POA: Diagnosis not present

## 2016-05-06 DIAGNOSIS — E1159 Type 2 diabetes mellitus with other circulatory complications: Secondary | ICD-10-CM | POA: Diagnosis not present

## 2016-05-06 DIAGNOSIS — J449 Chronic obstructive pulmonary disease, unspecified: Secondary | ICD-10-CM | POA: Diagnosis not present

## 2016-05-06 DIAGNOSIS — L97511 Non-pressure chronic ulcer of other part of right foot limited to breakdown of skin: Secondary | ICD-10-CM | POA: Diagnosis not present

## 2016-05-06 DIAGNOSIS — G894 Chronic pain syndrome: Secondary | ICD-10-CM | POA: Diagnosis not present

## 2016-05-06 DIAGNOSIS — E11621 Type 2 diabetes mellitus with foot ulcer: Secondary | ICD-10-CM | POA: Diagnosis not present

## 2016-05-06 DIAGNOSIS — E1151 Type 2 diabetes mellitus with diabetic peripheral angiopathy without gangrene: Secondary | ICD-10-CM | POA: Diagnosis not present

## 2016-05-06 DIAGNOSIS — E1142 Type 2 diabetes mellitus with diabetic polyneuropathy: Secondary | ICD-10-CM | POA: Diagnosis not present

## 2016-05-06 DIAGNOSIS — I5032 Chronic diastolic (congestive) heart failure: Secondary | ICD-10-CM | POA: Diagnosis not present

## 2016-05-06 DIAGNOSIS — I159 Secondary hypertension, unspecified: Secondary | ICD-10-CM | POA: Diagnosis not present

## 2016-05-06 DIAGNOSIS — H409 Unspecified glaucoma: Secondary | ICD-10-CM | POA: Diagnosis not present

## 2016-05-06 DIAGNOSIS — E1165 Type 2 diabetes mellitus with hyperglycemia: Secondary | ICD-10-CM | POA: Diagnosis not present

## 2016-05-07 ENCOUNTER — Other Ambulatory Visit: Payer: Self-pay

## 2016-05-07 DIAGNOSIS — F1120 Opioid dependence, uncomplicated: Secondary | ICD-10-CM

## 2016-05-07 MED ORDER — OXYCODONE-ACETAMINOPHEN 5-325 MG PO TABS: 1.0000 | ORAL_TABLET | ORAL | Status: DC | PRN

## 2016-05-07 NOTE — Telephone Encounter (Signed)
Per pharmacy patient unable to pick up prescription before 5/19. Contacted patient and patient was very tearful and upset and in "exruciating pain in her hands, legs, fingers and toes and I have bed bugs". Spoke with Dr. [name] who gave me permission to call the pharmacy to authorize an early refill for this month only. Called patient back and explained to her that we would fill it early for this month only. Advised patient to go to the ED if she felt her symptoms were severe.

## 2016-05-07 NOTE — Telephone Encounter (Signed)
Agree. Thanks

## 2016-05-07 NOTE — Telephone Encounter (Signed)
Dr Software engineer called back and was told pharmacy states a prescription will need to be written to

## 2016-05-07 NOTE — Telephone Encounter (Signed)
Requesting oxycodone to be filled.  

## 2016-05-08 ENCOUNTER — Encounter (HOSPITAL_BASED_OUTPATIENT_CLINIC_OR_DEPARTMENT_OTHER): Payer: Medicare Other | Attending: Internal Medicine

## 2016-05-08 DIAGNOSIS — G40909 Epilepsy, unspecified, not intractable, without status epilepticus: Secondary | ICD-10-CM | POA: Insufficient documentation

## 2016-05-08 DIAGNOSIS — I87321 Chronic venous hypertension (idiopathic) with inflammation of right lower extremity: Secondary | ICD-10-CM | POA: Diagnosis not present

## 2016-05-08 DIAGNOSIS — L97521 Non-pressure chronic ulcer of other part of left foot limited to breakdown of skin: Secondary | ICD-10-CM | POA: Insufficient documentation

## 2016-05-08 DIAGNOSIS — L97511 Non-pressure chronic ulcer of other part of right foot limited to breakdown of skin: Secondary | ICD-10-CM | POA: Diagnosis not present

## 2016-05-08 DIAGNOSIS — I509 Heart failure, unspecified: Secondary | ICD-10-CM | POA: Diagnosis not present

## 2016-05-08 DIAGNOSIS — Z993 Dependence on wheelchair: Secondary | ICD-10-CM | POA: Diagnosis not present

## 2016-05-08 DIAGNOSIS — J449 Chronic obstructive pulmonary disease, unspecified: Secondary | ICD-10-CM | POA: Insufficient documentation

## 2016-05-08 DIAGNOSIS — Z794 Long term (current) use of insulin: Secondary | ICD-10-CM | POA: Diagnosis not present

## 2016-05-08 DIAGNOSIS — H409 Unspecified glaucoma: Secondary | ICD-10-CM | POA: Diagnosis not present

## 2016-05-08 DIAGNOSIS — Z79899 Other long term (current) drug therapy: Secondary | ICD-10-CM | POA: Insufficient documentation

## 2016-05-08 DIAGNOSIS — I87311 Chronic venous hypertension (idiopathic) with ulcer of right lower extremity: Secondary | ICD-10-CM | POA: Diagnosis not present

## 2016-05-08 DIAGNOSIS — Z89512 Acquired absence of left leg below knee: Secondary | ICD-10-CM | POA: Diagnosis not present

## 2016-05-08 DIAGNOSIS — E11621 Type 2 diabetes mellitus with foot ulcer: Secondary | ICD-10-CM | POA: Diagnosis not present

## 2016-05-08 DIAGNOSIS — E1140 Type 2 diabetes mellitus with diabetic neuropathy, unspecified: Secondary | ICD-10-CM | POA: Diagnosis not present

## 2016-05-08 DIAGNOSIS — M069 Rheumatoid arthritis, unspecified: Secondary | ICD-10-CM | POA: Insufficient documentation

## 2016-05-08 DIAGNOSIS — E1151 Type 2 diabetes mellitus with diabetic peripheral angiopathy without gangrene: Secondary | ICD-10-CM | POA: Insufficient documentation

## 2016-05-09 DIAGNOSIS — E1139 Type 2 diabetes mellitus with other diabetic ophthalmic complication: Secondary | ICD-10-CM | POA: Diagnosis not present

## 2016-05-09 DIAGNOSIS — E11621 Type 2 diabetes mellitus with foot ulcer: Secondary | ICD-10-CM | POA: Diagnosis not present

## 2016-05-09 DIAGNOSIS — G894 Chronic pain syndrome: Secondary | ICD-10-CM | POA: Diagnosis not present

## 2016-05-09 DIAGNOSIS — S81809A Unspecified open wound, unspecified lower leg, initial encounter: Secondary | ICD-10-CM | POA: Diagnosis not present

## 2016-05-09 DIAGNOSIS — H409 Unspecified glaucoma: Secondary | ICD-10-CM | POA: Diagnosis not present

## 2016-05-09 DIAGNOSIS — J449 Chronic obstructive pulmonary disease, unspecified: Secondary | ICD-10-CM | POA: Diagnosis not present

## 2016-05-09 DIAGNOSIS — I159 Secondary hypertension, unspecified: Secondary | ICD-10-CM | POA: Diagnosis not present

## 2016-05-09 DIAGNOSIS — S88919A Complete traumatic amputation of unspecified lower leg, level unspecified, initial encounter: Secondary | ICD-10-CM | POA: Diagnosis not present

## 2016-05-09 DIAGNOSIS — E1151 Type 2 diabetes mellitus with diabetic peripheral angiopathy without gangrene: Secondary | ICD-10-CM | POA: Diagnosis not present

## 2016-05-09 DIAGNOSIS — E11319 Type 2 diabetes mellitus with unspecified diabetic retinopathy without macular edema: Secondary | ICD-10-CM | POA: Diagnosis not present

## 2016-05-09 DIAGNOSIS — I5032 Chronic diastolic (congestive) heart failure: Secondary | ICD-10-CM | POA: Diagnosis not present

## 2016-05-09 DIAGNOSIS — E1142 Type 2 diabetes mellitus with diabetic polyneuropathy: Secondary | ICD-10-CM | POA: Diagnosis not present

## 2016-05-09 DIAGNOSIS — E1165 Type 2 diabetes mellitus with hyperglycemia: Secondary | ICD-10-CM | POA: Diagnosis not present

## 2016-05-09 DIAGNOSIS — Q819 Epidermolysis bullosa, unspecified: Secondary | ICD-10-CM | POA: Diagnosis not present

## 2016-05-09 DIAGNOSIS — L97511 Non-pressure chronic ulcer of other part of right foot limited to breakdown of skin: Secondary | ICD-10-CM | POA: Diagnosis not present

## 2016-05-09 DIAGNOSIS — E1159 Type 2 diabetes mellitus with other circulatory complications: Secondary | ICD-10-CM | POA: Diagnosis not present

## 2016-05-12 ENCOUNTER — Telehealth: Payer: Self-pay | Admitting: Dietician

## 2016-05-13 DIAGNOSIS — G894 Chronic pain syndrome: Secondary | ICD-10-CM | POA: Diagnosis not present

## 2016-05-13 DIAGNOSIS — M797 Fibromyalgia: Secondary | ICD-10-CM | POA: Diagnosis not present

## 2016-05-13 DIAGNOSIS — I159 Secondary hypertension, unspecified: Secondary | ICD-10-CM | POA: Diagnosis not present

## 2016-05-13 DIAGNOSIS — J449 Chronic obstructive pulmonary disease, unspecified: Secondary | ICD-10-CM | POA: Diagnosis not present

## 2016-05-13 DIAGNOSIS — H409 Unspecified glaucoma: Secondary | ICD-10-CM | POA: Diagnosis not present

## 2016-05-13 DIAGNOSIS — M069 Rheumatoid arthritis, unspecified: Secondary | ICD-10-CM | POA: Diagnosis not present

## 2016-05-15 DIAGNOSIS — I87311 Chronic venous hypertension (idiopathic) with ulcer of right lower extremity: Secondary | ICD-10-CM | POA: Diagnosis not present

## 2016-05-15 DIAGNOSIS — Z89512 Acquired absence of left leg below knee: Secondary | ICD-10-CM | POA: Diagnosis not present

## 2016-05-15 DIAGNOSIS — Z79899 Other long term (current) drug therapy: Secondary | ICD-10-CM | POA: Diagnosis not present

## 2016-05-15 DIAGNOSIS — Z993 Dependence on wheelchair: Secondary | ICD-10-CM | POA: Diagnosis not present

## 2016-05-15 DIAGNOSIS — E1151 Type 2 diabetes mellitus with diabetic peripheral angiopathy without gangrene: Secondary | ICD-10-CM | POA: Diagnosis not present

## 2016-05-15 DIAGNOSIS — M069 Rheumatoid arthritis, unspecified: Secondary | ICD-10-CM | POA: Diagnosis not present

## 2016-05-15 DIAGNOSIS — H409 Unspecified glaucoma: Secondary | ICD-10-CM | POA: Diagnosis not present

## 2016-05-15 DIAGNOSIS — Z794 Long term (current) use of insulin: Secondary | ICD-10-CM | POA: Diagnosis not present

## 2016-05-15 DIAGNOSIS — I509 Heart failure, unspecified: Secondary | ICD-10-CM | POA: Diagnosis not present

## 2016-05-15 DIAGNOSIS — E11621 Type 2 diabetes mellitus with foot ulcer: Secondary | ICD-10-CM | POA: Diagnosis not present

## 2016-05-15 DIAGNOSIS — J449 Chronic obstructive pulmonary disease, unspecified: Secondary | ICD-10-CM | POA: Diagnosis not present

## 2016-05-15 DIAGNOSIS — L97521 Non-pressure chronic ulcer of other part of left foot limited to breakdown of skin: Secondary | ICD-10-CM | POA: Diagnosis not present

## 2016-05-15 DIAGNOSIS — I87321 Chronic venous hypertension (idiopathic) with inflammation of right lower extremity: Secondary | ICD-10-CM | POA: Diagnosis not present

## 2016-05-15 DIAGNOSIS — L97511 Non-pressure chronic ulcer of other part of right foot limited to breakdown of skin: Secondary | ICD-10-CM | POA: Diagnosis not present

## 2016-05-15 DIAGNOSIS — E1140 Type 2 diabetes mellitus with diabetic neuropathy, unspecified: Secondary | ICD-10-CM | POA: Diagnosis not present

## 2016-05-15 NOTE — Telephone Encounter (Signed)
Patient returned call and says she is "doing".  She asked for a visit next week when she is here for an annual wellness visit. Will try to work that out if possible.

## 2016-05-20 DIAGNOSIS — H409 Unspecified glaucoma: Secondary | ICD-10-CM | POA: Diagnosis not present

## 2016-05-20 DIAGNOSIS — E1151 Type 2 diabetes mellitus with diabetic peripheral angiopathy without gangrene: Secondary | ICD-10-CM | POA: Diagnosis not present

## 2016-05-20 DIAGNOSIS — G894 Chronic pain syndrome: Secondary | ICD-10-CM | POA: Diagnosis not present

## 2016-05-20 DIAGNOSIS — I159 Secondary hypertension, unspecified: Secondary | ICD-10-CM | POA: Diagnosis not present

## 2016-05-20 DIAGNOSIS — I5032 Chronic diastolic (congestive) heart failure: Secondary | ICD-10-CM | POA: Diagnosis not present

## 2016-05-20 DIAGNOSIS — E1165 Type 2 diabetes mellitus with hyperglycemia: Secondary | ICD-10-CM | POA: Diagnosis not present

## 2016-05-20 DIAGNOSIS — E1142 Type 2 diabetes mellitus with diabetic polyneuropathy: Secondary | ICD-10-CM | POA: Diagnosis not present

## 2016-05-20 DIAGNOSIS — E11621 Type 2 diabetes mellitus with foot ulcer: Secondary | ICD-10-CM | POA: Diagnosis not present

## 2016-05-20 DIAGNOSIS — J449 Chronic obstructive pulmonary disease, unspecified: Secondary | ICD-10-CM | POA: Diagnosis not present

## 2016-05-20 DIAGNOSIS — E1159 Type 2 diabetes mellitus with other circulatory complications: Secondary | ICD-10-CM | POA: Diagnosis not present

## 2016-05-20 DIAGNOSIS — E1139 Type 2 diabetes mellitus with other diabetic ophthalmic complication: Secondary | ICD-10-CM | POA: Diagnosis not present

## 2016-05-20 DIAGNOSIS — L97511 Non-pressure chronic ulcer of other part of right foot limited to breakdown of skin: Secondary | ICD-10-CM | POA: Diagnosis not present

## 2016-05-20 DIAGNOSIS — E11319 Type 2 diabetes mellitus with unspecified diabetic retinopathy without macular edema: Secondary | ICD-10-CM | POA: Diagnosis not present

## 2016-05-22 ENCOUNTER — Telehealth: Payer: Self-pay | Admitting: Dietician

## 2016-05-22 ENCOUNTER — Encounter: Payer: Medicare Other | Admitting: Internal Medicine

## 2016-05-22 ENCOUNTER — Ambulatory Visit: Payer: Medicare Other | Admitting: Internal Medicine

## 2016-05-22 ENCOUNTER — Encounter (HOSPITAL_BASED_OUTPATIENT_CLINIC_OR_DEPARTMENT_OTHER): Payer: Medicare Other | Attending: Internal Medicine

## 2016-05-22 DIAGNOSIS — Z79899 Other long term (current) drug therapy: Secondary | ICD-10-CM | POA: Diagnosis not present

## 2016-05-22 DIAGNOSIS — H409 Unspecified glaucoma: Secondary | ICD-10-CM | POA: Diagnosis not present

## 2016-05-22 DIAGNOSIS — E11621 Type 2 diabetes mellitus with foot ulcer: Secondary | ICD-10-CM | POA: Diagnosis not present

## 2016-05-22 DIAGNOSIS — J449 Chronic obstructive pulmonary disease, unspecified: Secondary | ICD-10-CM | POA: Diagnosis not present

## 2016-05-22 DIAGNOSIS — E1151 Type 2 diabetes mellitus with diabetic peripheral angiopathy without gangrene: Secondary | ICD-10-CM | POA: Insufficient documentation

## 2016-05-22 DIAGNOSIS — L97511 Non-pressure chronic ulcer of other part of right foot limited to breakdown of skin: Secondary | ICD-10-CM | POA: Diagnosis not present

## 2016-05-22 DIAGNOSIS — Z794 Long term (current) use of insulin: Secondary | ICD-10-CM | POA: Insufficient documentation

## 2016-05-22 DIAGNOSIS — I87331 Chronic venous hypertension (idiopathic) with ulcer and inflammation of right lower extremity: Secondary | ICD-10-CM | POA: Insufficient documentation

## 2016-05-22 DIAGNOSIS — M069 Rheumatoid arthritis, unspecified: Secondary | ICD-10-CM | POA: Insufficient documentation

## 2016-05-22 DIAGNOSIS — G40909 Epilepsy, unspecified, not intractable, without status epilepticus: Secondary | ICD-10-CM | POA: Insufficient documentation

## 2016-05-22 DIAGNOSIS — I509 Heart failure, unspecified: Secondary | ICD-10-CM | POA: Insufficient documentation

## 2016-05-22 DIAGNOSIS — E1140 Type 2 diabetes mellitus with diabetic neuropathy, unspecified: Secondary | ICD-10-CM | POA: Diagnosis not present

## 2016-05-22 LAB — GLUCOSE, CAPILLARY
Glucose-Capillary: 30 mg/dL — CL (ref 65–99)
Glucose-Capillary: 61 mg/dL — ABNORMAL LOW (ref 65–99)
Glucose-Capillary: 125 mg/dL — ABNORMAL HIGH (ref 65–99)
Glucose-Capillary: 71 mg/dL (ref 65–99)

## 2016-05-22 NOTE — Telephone Encounter (Signed)
thanks

## 2016-05-22 NOTE — Telephone Encounter (Signed)
Jennifer [name] called to let us know that she dad to cancel her appointments because transportation forgot to pick her up. She will try to reschedule with both Dr. [name] and health coach and if unable to get an appointment with Dr. [name] this month, she intends to schedule with health coach. She is concerned about missing her A1C recheck She [facility] and confirmed that she does have transportation to that appointment. Commended her for communicating and self care.

## 2016-05-23 ENCOUNTER — Other Ambulatory Visit: Payer: Self-pay | Admitting: Internal Medicine

## 2016-05-26 NOTE — Telephone Encounter (Signed)
I have zloft as 100 qd in last note.  No BMP > 1 yr. Needs appt 30 day or labb appt in 30 days and see me later.

## 2016-05-27 DIAGNOSIS — E1159 Type 2 diabetes mellitus with other circulatory complications: Secondary | ICD-10-CM | POA: Diagnosis not present

## 2016-05-27 DIAGNOSIS — J449 Chronic obstructive pulmonary disease, unspecified: Secondary | ICD-10-CM | POA: Diagnosis not present

## 2016-05-27 DIAGNOSIS — E1165 Type 2 diabetes mellitus with hyperglycemia: Secondary | ICD-10-CM | POA: Diagnosis not present

## 2016-05-27 DIAGNOSIS — E11319 Type 2 diabetes mellitus with unspecified diabetic retinopathy without macular edema: Secondary | ICD-10-CM | POA: Diagnosis not present

## 2016-05-27 DIAGNOSIS — I159 Secondary hypertension, unspecified: Secondary | ICD-10-CM | POA: Diagnosis not present

## 2016-05-27 DIAGNOSIS — L97511 Non-pressure chronic ulcer of other part of right foot limited to breakdown of skin: Secondary | ICD-10-CM | POA: Diagnosis not present

## 2016-05-27 DIAGNOSIS — E1142 Type 2 diabetes mellitus with diabetic polyneuropathy: Secondary | ICD-10-CM | POA: Diagnosis not present

## 2016-05-27 DIAGNOSIS — E11621 Type 2 diabetes mellitus with foot ulcer: Secondary | ICD-10-CM | POA: Diagnosis not present

## 2016-05-27 DIAGNOSIS — G894 Chronic pain syndrome: Secondary | ICD-10-CM | POA: Diagnosis not present

## 2016-05-27 DIAGNOSIS — E1151 Type 2 diabetes mellitus with diabetic peripheral angiopathy without gangrene: Secondary | ICD-10-CM | POA: Diagnosis not present

## 2016-05-27 DIAGNOSIS — H409 Unspecified glaucoma: Secondary | ICD-10-CM | POA: Diagnosis not present

## 2016-05-27 DIAGNOSIS — I5032 Chronic diastolic (congestive) heart failure: Secondary | ICD-10-CM | POA: Diagnosis not present

## 2016-05-27 DIAGNOSIS — E1139 Type 2 diabetes mellitus with other diabetic ophthalmic complication: Secondary | ICD-10-CM | POA: Diagnosis not present

## 2016-05-28 ENCOUNTER — Other Ambulatory Visit: Payer: Self-pay | Admitting: Internal Medicine

## 2016-05-28 DIAGNOSIS — L97519 Non-pressure chronic ulcer of other part of right foot with unspecified severity: Secondary | ICD-10-CM | POA: Diagnosis not present

## 2016-05-30 DIAGNOSIS — E1159 Type 2 diabetes mellitus with other circulatory complications: Secondary | ICD-10-CM | POA: Diagnosis not present

## 2016-05-30 DIAGNOSIS — G894 Chronic pain syndrome: Secondary | ICD-10-CM | POA: Diagnosis not present

## 2016-05-30 DIAGNOSIS — H409 Unspecified glaucoma: Secondary | ICD-10-CM | POA: Diagnosis not present

## 2016-05-30 DIAGNOSIS — I5032 Chronic diastolic (congestive) heart failure: Secondary | ICD-10-CM | POA: Diagnosis not present

## 2016-05-30 DIAGNOSIS — E11621 Type 2 diabetes mellitus with foot ulcer: Secondary | ICD-10-CM | POA: Diagnosis not present

## 2016-05-30 DIAGNOSIS — L97511 Non-pressure chronic ulcer of other part of right foot limited to breakdown of skin: Secondary | ICD-10-CM | POA: Diagnosis not present

## 2016-05-30 DIAGNOSIS — E11319 Type 2 diabetes mellitus with unspecified diabetic retinopathy without macular edema: Secondary | ICD-10-CM | POA: Diagnosis not present

## 2016-05-30 DIAGNOSIS — E1142 Type 2 diabetes mellitus with diabetic polyneuropathy: Secondary | ICD-10-CM | POA: Diagnosis not present

## 2016-05-30 DIAGNOSIS — E1151 Type 2 diabetes mellitus with diabetic peripheral angiopathy without gangrene: Secondary | ICD-10-CM | POA: Diagnosis not present

## 2016-05-30 DIAGNOSIS — E1165 Type 2 diabetes mellitus with hyperglycemia: Secondary | ICD-10-CM | POA: Diagnosis not present

## 2016-05-30 DIAGNOSIS — E1139 Type 2 diabetes mellitus with other diabetic ophthalmic complication: Secondary | ICD-10-CM | POA: Diagnosis not present

## 2016-05-30 DIAGNOSIS — J449 Chronic obstructive pulmonary disease, unspecified: Secondary | ICD-10-CM | POA: Diagnosis not present

## 2016-05-30 DIAGNOSIS — I159 Secondary hypertension, unspecified: Secondary | ICD-10-CM | POA: Diagnosis not present

## 2016-06-02 DIAGNOSIS — L97511 Non-pressure chronic ulcer of other part of right foot limited to breakdown of skin: Secondary | ICD-10-CM | POA: Diagnosis not present

## 2016-06-02 DIAGNOSIS — M069 Rheumatoid arthritis, unspecified: Secondary | ICD-10-CM | POA: Diagnosis not present

## 2016-06-02 DIAGNOSIS — Z79899 Other long term (current) drug therapy: Secondary | ICD-10-CM | POA: Diagnosis not present

## 2016-06-02 DIAGNOSIS — Z09 Encounter for follow-up examination after completed treatment for conditions other than malignant neoplasm: Secondary | ICD-10-CM | POA: Diagnosis not present

## 2016-06-02 DIAGNOSIS — I509 Heart failure, unspecified: Secondary | ICD-10-CM | POA: Diagnosis not present

## 2016-06-02 DIAGNOSIS — Z872 Personal history of diseases of the skin and subcutaneous tissue: Secondary | ICD-10-CM | POA: Diagnosis not present

## 2016-06-02 DIAGNOSIS — E11621 Type 2 diabetes mellitus with foot ulcer: Secondary | ICD-10-CM | POA: Diagnosis not present

## 2016-06-02 DIAGNOSIS — H409 Unspecified glaucoma: Secondary | ICD-10-CM | POA: Diagnosis not present

## 2016-06-02 DIAGNOSIS — I87331 Chronic venous hypertension (idiopathic) with ulcer and inflammation of right lower extremity: Secondary | ICD-10-CM | POA: Diagnosis not present

## 2016-06-02 DIAGNOSIS — E1140 Type 2 diabetes mellitus with diabetic neuropathy, unspecified: Secondary | ICD-10-CM | POA: Diagnosis not present

## 2016-06-02 DIAGNOSIS — E1151 Type 2 diabetes mellitus with diabetic peripheral angiopathy without gangrene: Secondary | ICD-10-CM | POA: Diagnosis not present

## 2016-06-02 DIAGNOSIS — Z794 Long term (current) use of insulin: Secondary | ICD-10-CM | POA: Diagnosis not present

## 2016-06-02 DIAGNOSIS — J449 Chronic obstructive pulmonary disease, unspecified: Secondary | ICD-10-CM | POA: Diagnosis not present

## 2016-06-05 ENCOUNTER — Ambulatory Visit (INDEPENDENT_AMBULATORY_CARE_PROVIDER_SITE_OTHER): Payer: Medicare Other | Admitting: Internal Medicine

## 2016-06-05 ENCOUNTER — Encounter (HOSPITAL_COMMUNITY): Payer: Self-pay | Admitting: General Practice

## 2016-06-05 ENCOUNTER — Other Ambulatory Visit: Payer: Self-pay | Admitting: Internal Medicine

## 2016-06-05 ENCOUNTER — Other Ambulatory Visit: Payer: Self-pay

## 2016-06-05 ENCOUNTER — Ambulatory Visit (HOSPITAL_COMMUNITY)
Admission: RE | Admit: 2016-06-05 | Discharge: 2016-06-05 | Disposition: A | Discharge status: Home / Self Care | Payer: Medicare Other | Source: Ambulatory Visit | Attending: Internal Medicine | Admitting: Internal Medicine

## 2016-06-05 ENCOUNTER — Observation Stay (HOSPITAL_BASED_OUTPATIENT_CLINIC_OR_DEPARTMENT_OTHER)
Admission: AD | Admit: 2016-06-05 | Discharge: 2016-06-08 | Disposition: A | Discharge status: Home / Self Care | Payer: Medicare Other | Source: Ambulatory Visit | Attending: Internal Medicine | Admitting: Internal Medicine

## 2016-06-05 ENCOUNTER — Encounter: Payer: Self-pay | Admitting: Internal Medicine

## 2016-06-05 VITALS — BP 147/72 | HR 96 | Temp 98.2°F | Wt 274.3 lb

## 2016-06-05 DIAGNOSIS — E1165 Type 2 diabetes mellitus with hyperglycemia: Secondary | ICD-10-CM

## 2016-06-05 DIAGNOSIS — F1120 Opioid dependence, uncomplicated: Secondary | ICD-10-CM | POA: Diagnosis present

## 2016-06-05 DIAGNOSIS — Z79891 Long term (current) use of opiate analgesic: Secondary | ICD-10-CM

## 2016-06-05 DIAGNOSIS — S88919A Complete traumatic amputation of unspecified lower leg, level unspecified, initial encounter: Secondary | ICD-10-CM | POA: Diagnosis not present

## 2016-06-05 DIAGNOSIS — J449 Chronic obstructive pulmonary disease, unspecified: Secondary | ICD-10-CM | POA: Diagnosis not present

## 2016-06-05 DIAGNOSIS — I110 Hypertensive heart disease with heart failure: Secondary | ICD-10-CM

## 2016-06-05 DIAGNOSIS — E1151 Type 2 diabetes mellitus with diabetic peripheral angiopathy without gangrene: Secondary | ICD-10-CM

## 2016-06-05 DIAGNOSIS — E1351 Other specified diabetes mellitus with diabetic peripheral angiopathy without gangrene: Secondary | ICD-10-CM | POA: Diagnosis present

## 2016-06-05 DIAGNOSIS — J441 Chronic obstructive pulmonary disease with (acute) exacerbation: Secondary | ICD-10-CM

## 2016-06-05 DIAGNOSIS — Z89519 Acquired absence of unspecified leg below knee: Secondary | ICD-10-CM

## 2016-06-05 DIAGNOSIS — E785 Hyperlipidemia, unspecified: Secondary | ICD-10-CM | POA: Diagnosis present

## 2016-06-05 DIAGNOSIS — J42 Unspecified chronic bronchitis: Secondary | ICD-10-CM

## 2016-06-05 DIAGNOSIS — Z794 Long term (current) use of insulin: Secondary | ICD-10-CM

## 2016-06-05 DIAGNOSIS — I5032 Chronic diastolic (congestive) heart failure: Secondary | ICD-10-CM

## 2016-06-05 DIAGNOSIS — IMO0002 Reserved for concepts with insufficient information to code with codable children: Secondary | ICD-10-CM

## 2016-06-05 DIAGNOSIS — E1169 Type 2 diabetes mellitus with other specified complication: Secondary | ICD-10-CM | POA: Diagnosis present

## 2016-06-05 DIAGNOSIS — Z89511 Acquired absence of right leg below knee: Secondary | ICD-10-CM

## 2016-06-05 DIAGNOSIS — I10 Essential (primary) hypertension: Secondary | ICD-10-CM | POA: Diagnosis present

## 2016-06-05 DIAGNOSIS — I152 Hypertension secondary to endocrine disorders: Secondary | ICD-10-CM | POA: Diagnosis present

## 2016-06-05 DIAGNOSIS — I739 Peripheral vascular disease, unspecified: Secondary | ICD-10-CM | POA: Diagnosis present

## 2016-06-05 DIAGNOSIS — F17210 Nicotine dependence, cigarettes, uncomplicated: Secondary | ICD-10-CM | POA: Diagnosis not present

## 2016-06-05 DIAGNOSIS — E669 Obesity, unspecified: Secondary | ICD-10-CM | POA: Diagnosis present

## 2016-06-05 DIAGNOSIS — Z89512 Acquired absence of left leg below knee: Secondary | ICD-10-CM

## 2016-06-05 DIAGNOSIS — E1159 Type 2 diabetes mellitus with other circulatory complications: Secondary | ICD-10-CM | POA: Diagnosis present

## 2016-06-05 DIAGNOSIS — Z9889 Other specified postprocedural states: Secondary | ICD-10-CM | POA: Diagnosis present

## 2016-06-05 DIAGNOSIS — D696 Thrombocytopenia, unspecified: Secondary | ICD-10-CM

## 2016-06-05 DIAGNOSIS — Q819 Epidermolysis bullosa, unspecified: Secondary | ICD-10-CM | POA: Diagnosis not present

## 2016-06-05 DIAGNOSIS — E1121 Type 2 diabetes mellitus with diabetic nephropathy: Secondary | ICD-10-CM | POA: Diagnosis present

## 2016-06-05 LAB — LIPID PANEL
Cholesterol: 139 mg/dL (ref 0–200)
HDL: 34 mg/dL — ABNORMAL LOW (ref >40)
LDL Cholesterol: 77 mg/dL (ref 0–99)
Total CHOL/HDL Ratio: 4.1 RATIO
Triglycerides: 142 mg/dL (ref <150)
VLDL: 28 mg/dL (ref 0–40)

## 2016-06-05 LAB — BASIC METABOLIC PANEL
Anion gap: 7 (ref 5–15)
BUN: 9 mg/dL (ref 6–20)
CO2: 28 mmol/L (ref 22–32)
Calcium: 8.6 mg/dL — ABNORMAL LOW (ref 8.9–10.3)
Chloride: 104 mmol/L (ref 101–111)
Creatinine, Ser: 0.75 mg/dL (ref 0.44–1.00)
GFR calc Af Amer: >60 mL/min (ref >60)
GFR calc non Af Amer: >60 mL/min (ref >60)
Glucose, Bld: 297 mg/dL — ABNORMAL HIGH (ref 65–99)
Potassium: 4 mmol/L (ref 3.5–5.1)
Sodium: 139 mmol/L (ref 135–145)

## 2016-06-05 LAB — GLUCOSE, CAPILLARY
Glucose-Capillary: 302 mg/dL — ABNORMAL HIGH (ref 65–99)
Glucose-Capillary: 329 mg/dL — ABNORMAL HIGH (ref 65–99)
Glucose-Capillary: 295 mg/dL — ABNORMAL HIGH (ref 65–99)

## 2016-06-05 LAB — CBC WITH DIFFERENTIAL/PLATELET
Basophils Absolute: 0 10*3/uL (ref 0.0–0.1)
Basophils Relative: 0 %
Eosinophils Absolute: 0.1 10*3/uL (ref 0.0–0.7)
Eosinophils Relative: 2 %
HCT: 42.5 % (ref 36.0–46.0)
Hemoglobin: 13.7 g/dL (ref 12.0–15.0)
Lymphocytes Relative: 29 %
Lymphs Abs: 1.8 10*3/uL (ref 0.7–4.0)
MCH: 31 pg (ref 26.0–34.0)
MCHC: 32.2 g/dL (ref 30.0–36.0)
MCV: 96.2 fL (ref 78.0–100.0)
Monocytes Absolute: 0.4 10*3/uL (ref 0.1–1.0)
Monocytes Relative: 7 %
Neutro Abs: 4 10*3/uL (ref 1.7–7.7)
Neutrophils Relative %: 62 %
Platelets: 125 10*3/uL — ABNORMAL LOW (ref 150–400)
RBC: 4.42 MIL/uL (ref 3.87–5.11)
RDW: 14.2 % (ref 11.5–15.5)
WBC: 6.4 10*3/uL (ref 4.0–10.5)

## 2016-06-05 LAB — POCT GLYCOSYLATED HEMOGLOBIN (HGB A1C): Hemoglobin A1C: 8.7

## 2016-06-05 LAB — APTT: aPTT: 31 seconds (ref 24–37)

## 2016-06-05 LAB — BRAIN NATRIURETIC PEPTIDE: B Natriuretic Peptide: 431.2 pg/mL — ABNORMAL HIGH (ref 0.0–100.0)

## 2016-06-05 LAB — PROTIME-INR
INR: 1.25 (ref 0.00–1.49)
Prothrombin Time: 15.9 seconds — ABNORMAL HIGH (ref 11.6–15.2)

## 2016-06-05 MED ORDER — INSULIN ASPART PROT & ASPART (70-30 MIX) 100 UNIT/ML ~~LOC~~ SUSP: 45.0000 [IU] | Freq: Every day | SUBCUTANEOUS | Status: DC

## 2016-06-05 MED ORDER — POLYETHYLENE GLYCOL 3350 17 G PO PACK: 17.0000 g | PACK | Freq: Every day | ORAL | Status: DC | PRN

## 2016-06-05 MED ORDER — OXYCODONE-ACETAMINOPHEN 5-325 MG PO TABS: 1.0000 | ORAL_TABLET | ORAL | Status: DC | PRN

## 2016-06-05 MED ORDER — SERTRALINE HCL 100 MG PO TABS
100.0000 mg | ORAL_TABLET | Freq: Every day | ORAL | Status: DC
  Administered 2016-06-05 – 2016-06-08 (×4): 100 mg via ORAL
  Filled 2016-06-05 (×4): qty 1

## 2016-06-05 MED ORDER — PREGABALIN 100 MG PO CAPS
200.0000 mg | ORAL_CAPSULE | Freq: Three times a day (TID) | ORAL | Status: DC
  Administered 2016-06-05 – 2016-06-08 (×9): 200 mg via ORAL
  Filled 2016-06-05 (×9): qty 2

## 2016-06-05 MED ORDER — INSULIN ASPART PROT & ASPART (70-30 MIX) 100 UNIT/ML ~~LOC~~ SUSP
20.0000 [IU] | Freq: Every day | SUBCUTANEOUS | Status: DC
  Filled 2016-06-05: qty 10

## 2016-06-05 MED ORDER — INSULIN LISPRO PROT & LISPRO (75-25 MIX) 100 UNIT/ML KWIKPEN: 20.0000 [IU] | PEN_INJECTOR | Freq: Every day | SUBCUTANEOUS | Status: DC

## 2016-06-05 MED ORDER — METHYLPREDNISOLONE SODIUM SUCC 125 MG IJ SOLR
125.0000 mg | Freq: Every day | INTRAMUSCULAR | Status: DC
  Administered 2016-06-05 – 2016-06-06 (×2): 125 mg via INTRAVENOUS
  Filled 2016-06-05 (×2): qty 2

## 2016-06-05 MED ORDER — POLYETHYLENE GLYCOL 3350 17 G PO PACK
17.0000 g | PACK | Freq: Every day | ORAL | Status: DC
  Filled 2016-06-05 (×2): qty 1

## 2016-06-05 MED ORDER — AZITHROMYCIN 500 MG PO TABS: 500.0000 mg | ORAL_TABLET | Freq: Every day | ORAL | Status: DC

## 2016-06-05 MED ORDER — INSULIN ASPART 100 UNIT/ML ~~LOC~~ SOLN
0.0000 [IU] | Freq: Three times a day (TID) | SUBCUTANEOUS | Status: DC
  Administered 2016-06-05: 15 [IU] via SUBCUTANEOUS
  Administered 2016-06-06 (×3): 20 [IU] via SUBCUTANEOUS
  Administered 2016-06-07: 4 [IU] via SUBCUTANEOUS
  Administered 2016-06-07: 20 [IU] via SUBCUTANEOUS
  Administered 2016-06-07: 7 [IU] via SUBCUTANEOUS
  Administered 2016-06-08: 3 [IU] via SUBCUTANEOUS

## 2016-06-05 MED ORDER — OXYCODONE-ACETAMINOPHEN 5-325 MG PO TABS
1.0000 | ORAL_TABLET | ORAL | Status: DC | PRN
  Administered 2016-06-06: 1 via ORAL
  Administered 2016-06-06: 2 via ORAL
  Administered 2016-06-06 – 2016-06-08 (×5): 1 via ORAL
  Filled 2016-06-05 (×2): qty 1
  Filled 2016-06-05: qty 2
  Filled 2016-06-05: qty 1
  Filled 2016-06-05 (×2): qty 2
  Filled 2016-06-05 (×2): qty 1

## 2016-06-05 MED ORDER — IPRATROPIUM-ALBUTEROL 0.5-2.5 (3) MG/3ML IN SOLN
3.0000 mL | Freq: Four times a day (QID) | RESPIRATORY_TRACT | Status: DC
  Administered 2016-06-05 – 2016-06-06 (×4): 3 mL via RESPIRATORY_TRACT
  Filled 2016-06-05 (×6): qty 3

## 2016-06-05 MED ORDER — ROSUVASTATIN CALCIUM 20 MG PO TABS
20.0000 mg | ORAL_TABLET | Freq: Every day | ORAL | Status: DC
  Administered 2016-06-05 – 2016-06-07 (×3): 20 mg via ORAL
  Filled 2016-06-05 (×3): qty 1

## 2016-06-05 MED ORDER — MOMETASONE FURO-FORMOTEROL FUM 200-5 MCG/ACT IN AERO
2.0000 | INHALATION_SPRAY | Freq: Two times a day (BID) | RESPIRATORY_TRACT | Status: DC
  Administered 2016-06-05 – 2016-06-08 (×6): 2 via RESPIRATORY_TRACT
  Filled 2016-06-05: qty 8.8

## 2016-06-05 MED ORDER — ENOXAPARIN SODIUM 40 MG/0.4ML ~~LOC~~ SOLN
40.0000 mg | SUBCUTANEOUS | Status: DC
  Administered 2016-06-05 – 2016-06-06 (×2): 40 mg via SUBCUTANEOUS
  Filled 2016-06-05 (×3): qty 0.4

## 2016-06-05 MED ORDER — ATENOLOL 50 MG PO TABS
100.0000 mg | ORAL_TABLET | Freq: Every day | ORAL | Status: DC
  Administered 2016-06-05 – 2016-06-08 (×4): 100 mg via ORAL
  Filled 2016-06-05 (×4): qty 2

## 2016-06-05 MED ORDER — AMLODIPINE BESYLATE 10 MG PO TABS
10.0000 mg | ORAL_TABLET | Freq: Every day | ORAL | Status: DC
  Administered 2016-06-05 – 2016-06-08 (×4): 10 mg via ORAL
  Filled 2016-06-05 (×4): qty 1

## 2016-06-05 MED ORDER — TRAZODONE HCL 100 MG PO TABS
100.0000 mg | ORAL_TABLET | Freq: Every evening | ORAL | Status: DC | PRN
  Administered 2016-06-05 – 2016-06-07 (×3): 100 mg via ORAL
  Filled 2016-06-05 (×3): qty 1

## 2016-06-05 MED ORDER — GUAIFENESIN-DM 100-10 MG/5ML PO SYRP
5.0000 mL | ORAL_SOLUTION | ORAL | Status: DC | PRN
  Administered 2016-06-06: 5 mL via ORAL
  Filled 2016-06-05: qty 5

## 2016-06-05 MED ORDER — BACLOFEN 10 MG PO TABS
10.0000 mg | ORAL_TABLET | Freq: Three times a day (TID) | ORAL | Status: DC | PRN
  Administered 2016-06-05 – 2016-06-07 (×5): 10 mg via ORAL
  Filled 2016-06-05 (×6): qty 1

## 2016-06-05 MED ORDER — FLUTICASONE PROPIONATE 50 MCG/ACT NA SUSP
2.0000 | Freq: Every day | NASAL | Status: DC | PRN
  Filled 2016-06-05: qty 16

## 2016-06-05 NOTE — Assessment & Plan Note (Addendum)
She noticed increased phlegm one week ago but on the 12th, she had sudden onset of feeling very poorly which included increased productive cough with more sputum production, increased dyspnea even at rest, orthopnea (using 4 pillows - old but now can't lay down at all bc cough increases), insomnia, pain in chest, ribs, and stomach from coughing, sweating, anorexia, central CP, urinary incontinence with coughing, increased RLE edema, sore throat, rhinorrhea, itchy eyes, myalgias, and 4-5 loose stools from coughing. She denies dysuria, weight change (not weighing herself), fever. She denies sick contacts even though there are children in the home.  On exam, BP is 147/72, HR 96, Temp 98.2, O2 sat 89% RA. Her weight is stable since Jan. She is in no resp distress and speaking in full sentences. Her lungs do show diffuse wheezing and non pitting edema of LE.  She has no leukocytosis. Her plts remain low.  I personally viewed his CXR images which did not show overt edema and was not changed from prior.   A/P : I think she has a viral resp infxn that triggered a COPD exac. She has all three criteria for COPD exac, meaning her exav is mod to severe. She could have been treated at home but she as insig support at home, she has sig co-morbidities, and she did not feel safe treating this at home. Therefore, she will be admitted to [facility]BS for COPD tx with ABX, steroids, nebs, O2 PRN, and time. I informed her this will likely be a short stay of 1-3 nights.    I do not see signs / evidence of pul edema but will review the final radiology reading.

## 2016-06-05 NOTE — Assessment & Plan Note (Signed)
When looking at chart prior to her appt, I saw that her recent plts (since 2015 on three occassions)  were low and in Oct 2014 her INR / PT high (on further review, she was on warfarin post op for foot osteo). Her plts remain low (acute illness though) and her PT slightly high. Her LFT's Oct 2015 were nl. She has had no liver imaging. 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   PLAN : F/U outpt

## 2016-06-05 NOTE — Progress Notes (Signed)
   Subjective:    [name], female    DOB: [birth year], 55 y.o.   MRN: [id number]  HPI  Jennifer [name] is here for cough. Please see the A&P for the status of the pt's chronic medical problems.  ROS : per ROS section and in problem oriented charting. All other systems are negative.  PMHx, Soc hx, and / or Fam hx : Her adult kids and their sig others still living with her  Review of Systems  Constitutional: Positive for diaphoresis. Negative for fever, activity change and appetite change.  HENT: Positive for congestion, rhinorrhea, sneezing and sore throat.   Respiratory: Positive for cough, shortness of breath and wheezing.   Cardiovascular: Positive for chest pain and leg swelling.  Gastrointestinal: Positive for abdominal pain and diarrhea.  Genitourinary: Negative for dysuria.       Urinary incontinence with cough  Musculoskeletal: Positive for myalgias, back pain, arthralgias and gait problem.  Psychiatric/Behavioral: Positive for sleep disturbance.       Objective:   Physical Exam  Constitutional: She appears well-developed and well-nourished. No distress.  HENT:  Head: Normocephalic and atraumatic.  Right Ear: External ear normal.  Left Ear: External ear normal.  Eyes:  Eyes injected Eye movement abnl  Cardiovascular: Normal rate, regular rhythm and normal heart sounds.   No murmur heard. Pulmonary/Chest: Effort normal. No respiratory distress. She has wheezes.  Good air flow but wheezing throughout. No crackles / rales heard.  Musculoskeletal: Normal range of motion.  Neurological: She is alert.  Skin: She is not diaphoretic.  Psychiatric: She has a normal mood and affect. Her behavior is normal. Judgment and thought content normal.          Assessment & Plan:

## 2016-06-05 NOTE — Progress Notes (Signed)
Admission RN notified to complete health history.

## 2016-06-05 NOTE — Progress Notes (Signed)
Patient has multiple home medications with her personal belongings. I explained to the patient our protocol of taking home medications to pharmacy to be stored, but she declined at this time. She stated understanding that she must take hospital supplied medications and not her own medications. I offered again to take them to pharmacy and she declined a second time. Patient's primary nurse notified.  Joellen Jersey, RN.

## 2016-06-05 NOTE — Assessment & Plan Note (Addendum)
Due to her acute illness, we did not discuss her chronic pain. She is currently on #270 per month and is on a slow taper. I had planned to hold steady at #270 and not taper further just now but decided instead of a 10% taper I would change to a slower taper of one pill per week per month for next three months.    PLAN : Decrease each script by 4 pills (one fewer pill per week) Oxycodone 5 mg #266 or 8.9 per day Oxycodone 5 mg #262 or 8.7 per day Oxycodone 5 mg #258 or 8.6 per day F/U 3 months

## 2016-06-05 NOTE — H&P (Signed)
Date: 06/05/2016               Patient Name:  Jennifer [name] MRN: [id number]  DOB: 14-Oct-1961 Age / Sex: 55 y.o., female   PCP: Jennifer Crews, MD         Medical Service: Internal Medicine Teaching Service         Attending Physician: Dr. Aldine Contes, MD    First Contact: Dr. Tiburcio [name] Pager: [phone]  Second Contact: Dr. Posey [name] Pager: [phone]       After Hours (After 5p/  First Contact Pager: [phone]  weekends / holidays): Second Contact Pager: [phone]   Chief Complaint: cough, wheezing  History of Present Illness: Jennifer [name] is a 55 yo female with DMII, HTN, HLD, PVD s/p left BKA, HFpEF (EF 65-70% and grade 1 diastolic dysfunction; 123456), and GOLD Stage 2 COPD, presenting with 1 week h/o worsening wheezing, cough, and SOB.  She reports cough productive of yellow-tan sputum.  She normally uses her Albuterol inhaler 1-2 times daily, but has used it 6x daily for the last week.  She endorses runny nose, congestion, and muscle pains, but denies sick contacts, fevers, or chills.  She denies recent antibiotics or hospitalizations.  She continues to smoke 1 ppd.  She endorses sharp, burning chest pain with cough.  She endorses worsening leg swelling for the last 2 days and increased cough when lying flat.  She denies weight gain.  She is prescribed Lasix, but has not taken a dose for the last week because she will already urinate 40x per day when not taking Lasix.  She has had this polyuria and some urinary incontinence for the last 3 months.  She denies dysuria or polydipsia.  She has had some diarrhea for the last 2 days.  In the clinic, she was satting 89% on RA.  Meds: Current Facility-Administered Medications  Medication Dose Route Frequency Provider Last Rate Last Dose  . amLODipine (NORVASC) tablet 10 mg  10 mg Oral Daily Jennifer Dubin, MD      . atenolol (TENORMIN) tablet 100 mg  100 mg Oral Daily Jennifer Dubin, MD      . enoxaparin (LOVENOX) injection 40 mg  40 mg  Subcutaneous Q24H Jennifer Dubin, MD      . fluticasone (FLONASE) 50 MCG/ACT nasal spray 2 spray  2 spray Each Nare Daily PRN Jennifer Dubin, MD      . guaiFENesin-dextromethorphan (ROBITUSSIN DM) 100-10 MG/5ML syrup 5 mL  5 mL Oral Q4H PRN Jennifer Dubin, MD      . insulin aspart (novoLOG) injection 0-20 Units  0-20 Units Subcutaneous TID WC Jennifer Sherrye Payor, MD      . Insulin Lispro Prot & Lispro (HUMALOG 75/25 MIX) (75-25) 100 UNIT/ML KwikPen 20 Units  20 Units Subcutaneous QAC supper Jennifer Dubin, MD      . ipratropium-albuterol (DUONEB) 0.5-2.5 (3) MG/3ML nebulizer solution 3 mL  3 mL Nebulization Q6H Jennifer Sherrye Payor, MD      . methylPREDNISolone sodium succinate (SOLU-MEDROL) 125 mg/2 mL injection 125 mg  125 mg Intravenous Daily Jennifer Dubin, MD      . mometasone-formoterol (DULERA) 200-5 MCG/ACT inhaler 2 puff  2 puff Inhalation BID Jennifer Dubin, MD      . oxyCODONE-acetaminophen (PERCOCET/ROXICET) 5-325 MG per tablet 1-2 tablet  1-2 tablet Oral Q4H PRN Jennifer Dubin, MD      . polyethylene glycol (MIRALAX / GLYCOLAX) packet 17 g  17  g Oral Daily Jennifer Dubin, MD      . pregabalin (LYRICA) capsule 200 mg  200 mg Oral TID Jennifer Dubin, MD      . rosuvastatin (CRESTOR) tablet 20 mg  20 mg Oral QHS Jennifer Dubin, MD      . sertraline (ZOLOFT) tablet 100 mg  100 mg Oral Daily Jennifer Dubin, MD      . traZODone (DESYREL) tablet 100 mg  100 mg Oral QHS PRN Jennifer Dubin, MD        Allergies: Allergies as of 06/05/2016 - Review Complete 06/05/2016  Allergen Reaction Noted  . Abilify [aripiprazole] Other (See Comments) 11/09/2015  . Iohexol  08/17/2007  . Ivp dye [iodinated diagnostic agents]  05/10/2013  . Morphine sulfate Itching and Rash    Past Medical History  Diagnosis Date  . Depression   . GERD (gastroesophageal reflux disease)   . Hyperlipidemia   . Hypertension   . Glaucoma   . PVD (peripheral vascular disease) with claudication ([place])     Stents to bilateral  common iliac arteries (left 2005, right 2008), on chronic plavix  . Hyperplastic colon polyp 12/2010    Per colonoscopy (12/2010) - Dr. Deatra Ina  . Asthma   . COPD 01/08/2007    PFT's 05/2007 : FEV1/FVC 82, FEV1 64% pred, FEF 25-75% 40% predicted, 16% improvement in FEV1 with bronchodilators.     . Tobacco abuse   . Chronic osteomyelitis of foot (HCC)     chronic, right secondary to diabetic foot ulcers  . Polymicrobial bacterial infection 01/2013    GBS and S. aureus bacteremia // Source likely infected diabetic foot ulcer  . 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 Blood cultures 01/26/13 S. Aureus and GBS. Blood cultures Feb 6th, 8th, and 9th and March were negative.Repeat TEE 3/20 negative for vegitations  . 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 Chronic diastolic heart failure (HCC)     grade 2 per 2D echocardiogram (01/2013)  . Ulcer of foot, chronic (HCC)     Left. No OM per MRI (01/2013)  . DVT of upper extremity (deep vein thrombosis) ([place]) 03/11/2013    Secondary to PICC line. Right brachial vein, diagnosed on 03/10/2013 Coumadin for 3 months. End date 06/10/2013   . Chronic pain syndrome 12/03/2011    Likely secondary to depression, "fibromyalgia", neuropathy, and obesity. Lumbar MRI 2014 no sig change from prior (2008) : Stable hypertrophic facet disease most notable at L4-5. Stable shallow left foraminal/extraforaminal disc protrusion at L4-5. No direct neural compression.      . Environmental allergies     Hx: of  . Fatty liver 2003    observed on ultrasound abdomen  . CHF (congestive heart failure) ([place])   . Anginal pain [facility])     '3' of 10 ischemia ruled out 9/9   . Fibromyalgia   . Chronic bronchitis ([place])     "I get it alot" (09/28/2013)  . Exertional shortness of breath   . Rheumatoid arthritis ([place])   . Arthritis of lumbar spine   . Chronic lower back pain   . Diabetic peripheral neuropathy  ([place])   . Type II diabetes mellitus with peripheral circulatory disorders, uncontrolled DX: 1993    Insulin dep. Poor control. Complicated by diabetic foot ulcer and diabetic eye disease.    . 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 2/2 tobacco use, DM, and cont weight bearing on surgical wound and developed gangrene    Past Surgical History  Procedure Laterality Date  . Abdominal hysterectomy  1997    secondary to uterine fibroids  . Tubal ligation    . Shoulder arthroscopy w/ rotator cuff repair Bilateral   . Breast biopsy      multiple-benign per pt  . Wrist surgery Right     "for tumors" (09/28/2013)  . Ganglion cyst excision      multiple  . Other surgical history      stents in lower ext  . Tee without cardioversion N/A 01/31/2013    Procedure: TRANSESOPHAGEAL ECHOCARDIOGRAM (TEE);  Surgeon: Fay Records, MD;  Location: [place];  Service: Cardiovascular;  Laterality: N/A;  Rm [phone]  . Bladder surgery      bladder reconstruction surgery  . Tee without cardioversion N/A 03/10/2013    Procedure: TRANSESOPHAGEAL ECHOCARDIOGRAM (TEE);  Surgeon: Larey Dresser, MD;  Location: [place];  Service: Cardiovascular;  Laterality: N/A;  Rm. [phone]  . Skin split graft Bilateral 05/13/2013    Procedure: Right and Left Foot Allograft Skin Graft;  Surgeon: Newt Minion, MD;  Location: [place];  Service: Orthopedics;  Laterality: Bilateral;  Right and Left Foot Allograft Skin Graft  . Toe amputation Left 08/31/2013    4TH & 5 TH TOE   . Amputation Left 08/31/2013    Procedure: AMPUTATION RAY;  Surgeon: Newt Minion, MD;  Location: [place];  Service: Orthopedics;  Laterality: Left;  Left Foot 5th Ray Amputation  . Esophagogastroduodenoscopy N/A 09/20/2013    Procedure: ESOPHAGOGASTRODUODENOSCOPY (EGD);  Surgeon: Jerene Bears, MD;  Location: [place];  Service: Gastroenterology;  Laterality: N/A;  . Foot amputation through  metatarsal Left 09/28/2013  . Tonsillectomy    . Amputation Left 09/28/2013    Procedure: Left Midfoot amputation;  Surgeon: Newt Minion, MD;  Location: [place];  Service: Orthopedics;  Laterality: Left;  Left Midfoot amputation  . Amputation Left 10/14/2013    Procedure: AMPUTATION BELOW KNEE- left;  Surgeon: Newt Minion, MD;  Location: [place];  Service: Orthopedics;  Laterality: Left;  Left Below Knee Amputation    Family History  Problem Relation Age of Onset  . Diverticulosis Mother   . Diabetes Mother   . Hypertension Mother   . Congestive Heart Failure Mother   . Asthma Father   . CAD Sister 38    MI at age 38 per patient.  However, she has not had a stent or CABG.   . Heart disease Sister     before age 36   Social History   Social History  . Marital Status: Single    Spouse Name: N/A  . Number of Children: 2  . Years of Education: college   Occupational History  . Disability     previously worked as a IT sales professional History Main Topics  . Smoking status: Current Every Day Smoker -- 1.00 packs/day for 44 years    Types: Cigarettes  . Smokeless tobacco: Never Used  . Alcohol Use: No  . Drug Use: No     Comment: 09/28/2013 "no marijuana since 2011, no crack/cocaine 1989"  . Sexual Activity: Not Currently   Other Topics Concern  . Not on file   Social History Narrative   On disability. Lives with son in [place]. Formerly worked as Training and development officer.    Boyfriend passed  away stage 4 cancer March 2014.   S/p L BKA 2014. In wheelchair in paritially suitable apartment.     Review of Systems: Pertinent items noted in HPI and remainder of comprehensive ROS otherwise negative.  Physical Exam: Blood pressure 153/59, pulse 107, temperature 98.3 F (36.8 C), temperature source Oral, resp. rate 17, SpO2 96 %. Physical Exam  Constitutional: She is oriented to person, place, and time.  Obese female, sitting on side of bed, frequently coughing without respiratory distress.  HENT:    Head: Normocephalic and atraumatic.  Eyes: EOM are normal. No scleral icterus.  Neck: No tracheal deviation present.  JVD could not be assessed 2/2 body habitus.  Cardiovascular: Normal rate and regular rhythm.   Heart sounds distant, but no murmurs appreciated.   Pulmonary/Chest: Effort normal. No stridor. No respiratory distress. She has no rales.  No crackles appreciated.  Mild diffuse wheezes in all lung zones.  Abdominal: Soft. She exhibits no distension. There is no tenderness. There is no rebound and no guarding.  Musculoskeletal:  1+ nonpitting edema to ankle on RLE.  LLE s/p BKA.  Neurological: She is alert and oriented to person, place, and time.  Skin: Skin is warm and dry. She is not diaphoretic.     Lab results: Basic Metabolic Panel:  Recent Labs  06/05/16 1118  NA 139  K 4.0  CL 104  CO2 28  GLUCOSE 297*  BUN 9  CREATININE 0.75  CALCIUM 8.6*   Liver Function Tests: No results for input(s): AST, ALT, ALKPHOS, BILITOT, PROT, ALBUMIN in the last 72 hours. No results for input(s): LIPASE, AMYLASE in the last 72 hours. No results for input(s): AMMONIA in the last 72 hours. CBC:  Recent Labs  06/05/16 1118  WBC 6.4  NEUTROABS 4.0  HGB 13.7  HCT 42.5  MCV 96.2  PLT 125*   Cardiac Enzymes: No results for input(s): CKTOTAL, CKMB, CKMBINDEX, TROPONINI in the last 72 hours. BNP: No results for input(s): PROBNP in the last 72 hours. D-Dimer: No results for input(s): DDIMER in the last 72 hours. CBG:  Recent Labs  06/05/16 1034  GLUCAP 302*   Hemoglobin A1C:  Recent Labs  06/05/16 1042  HGBA1C 8.7   Fasting Lipid Panel:  Recent Labs  06/05/16 1118  CHOL 139  HDL 34*  LDLCALC 77  TRIG 142  CHOLHDL 4.1   Thyroid Function Tests: No results for input(s): TSH, T4TOTAL, FREET4, T3FREE, THYROIDAB in the last 72 hours. Anemia Panel: No results for input(s): VITAMINB12, FOLATE, FERRITIN, TIBC, IRON, RETICCTPCT in the last 72  hours. Coagulation:  Recent Labs  06/05/16 1118  LABPROT 15.9*  INR 1.25   Urine Drug Screen: Drugs of Abuse     Component Value Date/Time   LABOPIA PPS 02/28/2014 1222   LABOPIA NONE DETECTED 09/17/2013 0521   COCAINSCRNUR NEG 02/28/2014 1222   COCAINSCRNUR NONE DETECTED 09/17/2013 0521   LABBENZ NEG 02/28/2014 1222   LABBENZ NONE DETECTED 09/17/2013 0521   LABBENZ NEG 06/28/2010 2130   AMPHETMU NEG 02/28/2014 1222   AMPHETMU NONE DETECTED 09/17/2013 0521   AMPHETMU NEG 06/28/2010 2130   THCU NEG 02/28/2014 1222   THCU NONE DETECTED 09/17/2013 0521   LABBARB NEG 02/28/2014 1222   LABBARB NONE DETECTED 09/17/2013 0521    Alcohol Level: No results for input(s): ETH in the last 72 hours. Urinalysis: No results for input(s): COLORURINE, LABSPEC, PHURINE, GLUCOSEU, HGBUR, BILIRUBINUR, KETONESUR, PROTEINUR, UROBILINOGEN, NITRITE, LEUKOCYTESUR in the last 72 hours.  Invalid input(s):  APPERANCEUR Misc. Labs:   Imaging results:  Dg Chest 2 View  06/05/2016  CLINICAL DATA:  COPD exacerbation, chronic bronchitis EXAM: CHEST  2 VIEW COMPARISON:  Chest x-ray of 05/06/2014 FINDINGS: No active infiltrate or effusion is seen. Mediastinal and hilar contours are unremarkable. The heart is within normal limits in size. No acute bony abnormality is seen. IMPRESSION: No active cardiopulmonary disease. Electronically Signed   By: Ivar Drape M.D.   On: 06/05/2016 13:43    Other results: EKG: pending.  Assessment & Plan by Problem: Principal Problem:   COPD with acute exacerbation ([place])  Ms. Dzialo is a 55 yo female with DMII, HTN, HLD, PVD s/p left BKA, HFpEF (EF 65-70% and grade 1 diastolic dysfunction; 123456), and GOLD Stage 2 COPD, presenting with 1 week h/o worsening wheezing, cough, and SOB.    Acute on Chronic COPD Exacerbation: Patient presents with one week h/o runny nose, productive cough, wheezing, and SOB in the setting of COPD and continued tobacco use.  Etiology is likely  viral > bacterial, but she satisfies criteria for initiation of antibiotics. Of interest, her PFTs do not qualify as obstruction (FEV1/FVC 74% and FEV1 52%), which is more suggestive of restrictive disease.  However, the pulmonologist's interpretation was obstruction.  She has not had a sleep study for OSA, which is likely contributing to her symptoms.  Her chest pain is pleuritic and edema is nonpitting.  Her dyspnea has also slowly been getting worse.  Therefore, low likelihood of CHF, MI, or PE.  CXR negative for PNA.   [ ]  BNP [ ]  ECG - Dulera  - Solumedrol 125 mg once and Prednisone 40 mg PO x4 days - Azithromycin 500 mg IV once and 250 mg IV daily x4 days - Duonebs - Smoking cessation - PT  DMII: Last A1c 8.7% on 06/05/16.  Uncontrolled DMII may be the cause of her polyuria, but blood glucose of 300 is less than I would expect to cause osmotic diuresis.  Dr. Zenovia Jarred note also states her incontinence is worse with cough.  Will check U/A for glucosuria or UTI. [ ]  U/A - Lispro 75/25 45 U qAM and 20 U qPM - HOLD HCTZ and Lasix - Lyrica 200 mg - Percocet 5 1-2 tabs q4h PRN  HTN/HLD/HFpEF/PVD: BPs stable, but slightly hypertensive.  She did not take her medications before clinic. - Atenolol 100 mg - Amlodipine 10 mg  - HOLD HCTZ and Lasix  Thrombocytopenia: Plts have been low but stable. Dr. 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 - CTM  FEN/GI: - Carb  DVT Ppx: Lovenox  Dispo: Disposition is deferred at this time, awaiting improvement of current medical problems. Anticipated discharge in approximately 1-2 day(s).   The patient does have a current PCP Jennifer Crews, MD) and does need an [facility] hospital follow-up appointment after discharge.  The patient does not have transportation limitations that hinder transportation to clinic appointments.  Signed: Iline Oven, MD, PhD 06/05/2016, 3:49 PM

## 2016-06-05 NOTE — Assessment & Plan Note (Addendum)
A1C steadily increasing from 7.3 - 8.4 - 8.7 today. She does not follow a diabetic diet and her depression is playing sig into her poor DM control. Due to her acute illness, we did not address this.  PLAN : outpt F/U after stabilization Since needs venepuncture, check FLP today

## 2016-06-05 NOTE — Progress Notes (Signed)
Date: 06/05/2016               Patient Name:  Jennifer [name] MRN: [id number]  DOB: [birth year] Age / Sex: 55 y.o., female   PCP: Bartholomew Crews, MD              Medical Service: Internal Medicine Teaching Service              Attending Physician: Dr. Axel Filler, MD    First Contact: Roselyn Meier, MS 3 Pager: [phone]  Second Contact: Dr. Lovena Le Pager: [phone]  Third Contact Dr. Posey Pronto Pager: [phone]       After Hours (After 5p/  First Contact Pager: [phone]  weekends / holidays): Second Contact Pager: [phone]   Chief Complaint: Shortness of breath, cough  History of Present Illness: 55 year-old female with a history of COPD, tobacco abuse, HTN, HLD, PVD s/p left BKA, and HFpEF (EF 65-70 % grade 1 diastolic dysfunction) who initially presented to [facility] with shortness of breath and cough.  History limited due to patient being uncooperative so Dr. Zenovia Jarred clinic note was used to fill in some of the gaps.  Patient reports being in her normal state of health until 7 days ago when she noticed a worsening productive cough, though she is unable to describe the sputum.  Several days after the cough began she started experiencing worsening wheezing, shortness of breath, sore throat, and runny nose.  Her shortness of breath occurs even at rest.  Patient usually uses 4 pillows for sleep with every night but now states she is unable to lie flat secondary to dyspnea and increased cough.  She admits to chest and abdominal "soreness" which she attributes to the worsening cough.  She also complains of right leg swelling, generalized weakness, and fatigue.  Patient is prescribed Lasix 40 prn but has not taken a dose in the last week because it makes her urinate "40x per day".  Denies dysuria.   Denies fever, chills, weight loss, and dizziness.  Patient endorses increased stool frequency to 4-5 per day over the last few days as well.  Admits to polyuria and urinary incontinence with coughing.   Denies nausea, vomiting, melena, and hematochezia.  No known sick contacts.    Meds: No current facility-administered medications for this encounter.   Current Outpatient Prescriptions  Medication Sig Dispense Refill  . ADVAIR DISKUS 250-50 MCG/DOSE AEPB TAKE 1 INHALATION BY MOUTH TWICE DAILY 180 each 3  . albuterol (PROVENTIL) (2.5 MG/3ML) 0.083% nebulizer solution INHALE THE CONTENTS OF 1 VIAL VIA NEBULIZER EVERY 6 HOURS AS NEEDED FOR WHEEZING 360 mL 2  . amLODipine (NORVASC) 10 MG tablet Take 1 tablet (10 mg total) by mouth daily. 30 tablet 11  . atenolol (TENORMIN) 100 MG tablet TAKE 1 TABLET BY MOUTH ONCE DAILY 90 tablet 3  . atorvastatin (LIPITOR) 80 MG tablet Take 1 tablet (80 mg total) by mouth daily. 90 tablet 3  . baclofen (LIORESAL) 10 MG tablet TAKE 1 TABLET BY MOUTH THREE TIMES A DAY AS NEEDED 90 each 5  . benazepril (LOTENSIN) 40 MG tablet TAKE 1 TABLET BY MOUTH DAILY 90 tablet 3  . benzonatate (TESSALON) 100 MG capsule Take 1 capsule (100 mg total) by mouth 3 (three) times daily as needed for cough. 28 capsule 0  . clotrimazole-betamethasone (LOTRISONE) cream Apply to affected area under breasts 2 times daily 15 g 0  . diphenhydrAMINE (BENADRYL) 25 MG tablet Take 25 mg by mouth every 6 (six)  hours.     Marland Kitchen EASY TOUCH PEN NEEDLES 31G X 8 MM MISC USE TO INJECT INSULIN TWICE DAILY 100 each 10  . fluticasone (FLONASE) 50 MCG/ACT nasal spray PLACE 1 SPRAY INTO BOTH NOSTRILS DAILY 48 g 11  . furosemide (LASIX) 40 MG tablet Take 1 tablet (40 mg total) by mouth daily as needed. 30 tablet 2  . guaiFENesin-dextromethorphan (ROBITUSSIN DM) 100-10 MG/5ML syrup Take 5 mLs by mouth every 4 (four) hours as needed for cough. 118 mL 0  . HUMALOG MIX 75/25 KWIKPEN (75-25) 100 UNIT/ML Kwikpen INJECT 135 UNITS SUBCUTANEOUSLY IN THE MORNING BEFORE BREAKFAST & INJECT 70 UNITS SUBCUTANEOUSLY IN THE EVENING BEFORE EVENING MEAL 75 mL 5  . hydrochlorothiazide (HYDRODIURIL) 25 MG tablet Take 1 tablet (25 mg  total) by mouth daily. 30 tablet 3  . ipratropium (ATROVENT) 0.02 % nebulizer solution USE 1 VIAL VIA NEBULIZER EVERY 6 HOURS AS NEEDED FOR WHEEZING 75 mL 5  . lidocaine (LIDODERM) 5 % Place 1 patch onto the skin daily. Remove & Discard patch within 12 hours or as directed by MD 30 patch 0  . LYRICA 200 MG capsule TAKE 1 CAPSULE BY MOUTH THREE TIMES A DAY 90 capsule 5  . Neomycin-Bacitracin-Polymyxin (NEOSPORIN ORIGINAL) [phone] OINT Apply to spot on neck twice a day 14.2 g 0  . nystatin (MYCOSTATIN) powder APPLY TO AFFECTED AREA THREE TIMES A DAY 60 g 4  . ONETOUCH DELICA LANCETS 65K MISC USE TO CHECK BLOOD SUGARS FOUR TIMES A DAY 100 each 3  . ONETOUCH VERIO test strip USE 3-4 TIMES DAILY TO CHECK BLOOD SUGAR 100 each 11  . oxyCODONE-acetaminophen (PERCOCET/ROXICET) 5-325 MG tablet Take 1-2 tablets by mouth every 4 (four) hours as needed for severe pain. 258 tablet 0  . potassium chloride SA (K-DUR,KLOR-CON) 20 MEQ tablet TAKE 2 TABLETS BY MOUTH DAILY 180 tablet 0  . PROAIR HFA 108 (90 Base) MCG/ACT inhaler INHALE 2 PUFFS BY MOUTH EVERY 6 HOURS AS NEEDED FOR WHEEZING 8.5 each 4  . ranitidine (ZANTAC) 150 MG tablet TAKE 1 TABLET BY MOUTH TWICE DAILY 180 tablet 3  . rosuvastatin (CRESTOR) 20 MG tablet TAKE 1 TABLET BY MOUTH AT BEDTIME 90 tablet 3  . sertraline (ZOLOFT) 100 MG tablet Take 1 tablet (100 mg total) by mouth daily. 90 tablet 3  . travoprost, benzalkonium, (TRAVATAN) 0.004 % ophthalmic solution Place 1 drop into both eyes at bedtime.     . traZODone (DESYREL) 100 MG tablet TAKE 1 TABLET BY MOUTH DAILY AT BEDTIME AS NEEDED FOR SLEEP 90 tablet 3  . [DISCONTINUED] Potassium Chloride ER 20 MEQ TBCR Take 40 mEq by mouth daily. 30 tablet 0    Allergies: Allergies as of 06/05/2016 - Review Complete 06/05/2016  Allergen Reaction Noted  . Abilify [aripiprazole] Other (See Comments) 11/09/2015  . Iohexol  08/17/2007  . Ivp dye [iodinated diagnostic agents]  05/10/2013  . Morphine  sulfate Itching and Rash    Past Medical History  Diagnosis Date  . Depression   . GERD (gastroesophageal reflux disease)   . Hyperlipidemia   . Hypertension   . Glaucoma   . PVD (peripheral vascular disease) with claudication ([place])     Stents to bilateral common iliac arteries (left 2005, right 2008), on chronic plavix  . Hyperplastic colon polyp 12/2010    Per colonoscopy (12/2010) - Dr. Deatra Ina  . Asthma   . COPD 01/08/2007    PFT's 05/2007 : FEV1/FVC 82, FEV1 64% pred, FEF 25-75% 40% predicted,  16% improvement in FEV1 with bronchodilators.     . Tobacco abuse   . Chronic osteomyelitis of foot (HCC)     chronic, right secondary to diabetic foot ulcers  . Polymicrobial bacterial infection 01/2013    GBS and S. aureus bacteremia // Source likely infected diabetic foot ulcer  . 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 Blood cultures 01/26/13 S. Aureus and GBS. Blood cultures Feb 6th, 8th, and 9th and March were negative.Repeat TEE 3/20 negative for vegitations  . 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 Chronic diastolic heart failure (HCC)     grade 2 per 2D echocardiogram (01/2013)  . Ulcer of foot, chronic (HCC)     Left. No OM per MRI (01/2013)  . DVT of upper extremity (deep vein thrombosis) ([place]) 03/11/2013    Secondary to PICC line. Right brachial vein, diagnosed on 03/10/2013 Coumadin for 3 months. End date 06/10/2013   . Chronic pain syndrome 12/03/2011    Likely secondary to depression, "fibromyalgia", neuropathy, and obesity. Lumbar MRI 2014 no sig change from prior (2008) : Stable hypertrophic facet disease most notable at L4-5. Stable shallow left foraminal/extraforaminal disc protrusion at L4-5. No direct neural compression.      . Environmental allergies     Hx: of  . Fatty liver 2003    observed on ultrasound abdomen  . CHF (congestive heart failure) ([place])   . Anginal pain [facility])     '3' of 10  ischemia ruled out 9/9   . Fibromyalgia   . Chronic bronchitis ([place])     "I get it alot" (09/28/2013)  . Exertional shortness of breath   . Rheumatoid arthritis ([place])   . Arthritis of lumbar spine   . Chronic lower back pain   . Diabetic peripheral neuropathy ([place])   . Type II diabetes mellitus with peripheral circulatory disorders, uncontrolled DX: 1993    Insulin dep. Poor control. Complicated by diabetic foot ulcer and diabetic eye disease.    . 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 2/2 tobacco use, DM, and cont weight bearing on surgical wound and developed gangrene    Past Surgical History  Procedure Laterality Date  . Abdominal hysterectomy  1997    secondary to uterine fibroids  . Tubal ligation    . Shoulder arthroscopy w/ rotator cuff repair Bilateral   . Breast biopsy      multiple-benign per pt  . Wrist surgery Right     "for tumors" (09/28/2013)  . Ganglion cyst excision      multiple  . Other surgical history      stents in lower ext  . Tee without cardioversion N/A 01/31/2013    Procedure: TRANSESOPHAGEAL ECHOCARDIOGRAM (TEE);  Surgeon: Fay Records, MD;  Location: [place];  Service: Cardiovascular;  Laterality: N/A;  Rm [phone]  . Bladder surgery      bladder reconstruction surgery  . Tee without cardioversion N/A 03/10/2013    Procedure: TRANSESOPHAGEAL ECHOCARDIOGRAM (TEE);  Surgeon: Larey Dresser, MD;  Location: [place];  Service: Cardiovascular;  Laterality: N/A;  Rm. [phone]  . Skin split graft Bilateral 05/13/2013    Procedure: Right and Left Foot Allograft Skin Graft;  Surgeon: Newt Minion, MD;  Location: [place];  Service: Orthopedics;  Laterality: Bilateral;  Right and Left Foot Allograft Skin Graft  .  Toe amputation Left 08/31/2013    4TH & 5 TH TOE   . Amputation Left 08/31/2013    Procedure: AMPUTATION RAY;  Surgeon: Newt Minion, MD;  Location: [place];  Service: Orthopedics;   Laterality: Left;  Left Foot 5th Ray Amputation  . Esophagogastroduodenoscopy N/A 09/20/2013    Procedure: ESOPHAGOGASTRODUODENOSCOPY (EGD);  Surgeon: Jerene Bears, MD;  Location: [place] [facility];  Service: Gastroenterology;  Laterality: N/A;  . Foot amputation through metatarsal Left 09/28/2013  . Tonsillectomy    . Amputation Left 09/28/2013    Procedure: Left Midfoot amputation;  Surgeon: Newt Minion, MD;  Location: [place];  Service: Orthopedics;  Laterality: Left;  Left Midfoot amputation  . Amputation Left 10/14/2013    Procedure: AMPUTATION BELOW KNEE- left;  Surgeon: Newt Minion, MD;  Location: [place];  Service: Orthopedics;  Laterality: Left;  Left Below Knee Amputation    Family History  Problem Relation Age of Onset  . Diverticulosis Mother   . Diabetes Mother   . Hypertension Mother   . Congestive Heart Failure Mother   . Asthma Father   . CAD Sister 19    MI at age 7 per patient.  However, she has not had a stent or CABG.   . Heart disease Sister     before age 70   Social History   Social History  . Marital Status: Single    Spouse Name: N/A  . Number of Children: 2  . Years of Education: college   Occupational History  . Disability     previously worked as a IT sales professional History Main Topics  . Smoking status: Current Every Day Smoker -- 1.00 packs/day for 44 years    Types: Cigarettes  . Smokeless tobacco: Never Used  . Alcohol Use: No  . Drug Use: No     Comment: 09/28/2013 "no marijuana since 2011, no crack/cocaine 1989"  . Sexual Activity: Not Currently   Other Topics Concern  . Not on file   Social History Narrative   On disability. Lives with son in [place]. Formerly worked as Training and development officer.    Boyfriend passed away stage 4 cancer 03/06/13.   S/p L BKA 2014. In wheelchair in paritially suitable apartment.     Review of Systems: Pertinent items are noted in HPI.   Physical Exam: Temp:  [98.2 F (36.8 C)] 98.2 F (36.8 C) (06/15 1023) Pulse Rate:   [96] 96 (06/15 1023) BP: (147)/(72) 147/72 mmHg (06/15 1023) SpO2:  [89 %] 89 % (06/15 1023) Weight:  [124.422 kg (274 lb 4.8 oz)] 124.422 kg (274 lb 4.8 oz) (06/15 1023) General: Uncomfortable appearing, sitting in chair in NAD.  Uncooperative.  Frequently coughing.   CV: Regular rate and rhythm without murmurs.  Heart sounds distant Lungs:  Diffuse expiratory wheezes bilaterally.  Increased work up breathing, dyspneic at rest.  Able to speak in full sentences.  No crackles.  Abd: soft, nontender, nondistended. Ext: 1+ edema in RLE, left BKA Neuro:  No gross deficits.   Psych: flat affect, uncooperative  Lab results: Recent Results (from the past 2160 hour(s))  Glucose, capillary     Status: Abnormal   Collection Time: 05/22/16  5:07 PM  Result Value Ref Range   Glucose-Capillary 30 (LL) 65 - 99 mg/dL   Comment 1 Document in Chart   Glucose, capillary     Status: Abnormal   Collection Time: 05/22/16  5:23 PM  Result Value Ref Range   Glucose-Capillary  61 (L) 65 - 99 mg/dL  Glucose, capillary     Status: None   Collection Time: 05/22/16  5:43 PM  Result Value Ref Range   Glucose-Capillary 71 65 - 99 mg/dL  Glucose, capillary     Status: Abnormal   Collection Time: 05/22/16  6:11 PM  Result Value Ref Range   Glucose-Capillary 125 (H) 65 - 99 mg/dL  Glucose, capillary     Status: Abnormal   Collection Time: 06/05/16 10:34 AM  Result Value Ref Range   Glucose-Capillary 302 (H) 65 - 99 mg/dL   Comment 1 Call MD NNP PA CNM   POC Hbg A1C     Status: None   Collection Time: 06/05/16 10:42 AM  Result Value Ref Range   Hemoglobin A1C 8.7   APTT     Status: None   Collection Time: 06/05/16 11:18 AM  Result Value Ref Range   aPTT 31 24 - 37 seconds  BMP w Anion Gap (STAT/Sunquest-performed on-site)     Status: Abnormal   Collection Time: 06/05/16 11:18 AM  Result Value Ref Range   Sodium 139 135 - 145 mmol/L   Potassium 4.0 3.5 - 5.1 mmol/L   Chloride 104 101 - 111 mmol/L   CO2  28 22 - 32 mmol/L   Glucose, Bld 297 (H) 65 - 99 mg/dL   BUN 9 6 - 20 mg/dL   Creatinine, Ser 0.75 0.44 - 1.00 mg/dL   Calcium 8.6 (L) 8.9 - 10.3 mg/dL   GFR calc non Af Amer >60 >60 mL/min   GFR calc Af Amer >60 >60 mL/min    Comment: (NOTE) The eGFR has been calculated using the CKD EPI equation. This calculation has not been validated in all clinical situations. eGFR's persistently <60 mL/min signify possible Chronic Kidney Disease.    Anion gap 7 5 - 15  Lipid Profile     Status: Abnormal   Collection Time: 06/05/16 11:18 AM  Result Value Ref Range   Cholesterol 139 0 - 200 mg/dL   Triglycerides 142 <150 mg/dL   HDL 34 (L) >40 mg/dL   Total CHOL/HDL Ratio 4.1 RATIO   VLDL 28 0 - 40 mg/dL   LDL Cholesterol 77 0 - 99 mg/dL    Comment:        Total Cholesterol/HDL:CHD Risk Coronary Heart Disease Risk Table                     Men   Women  1/2 Average Risk   3.4   3.3  Average Risk       5.0   4.4  2 X Average Risk   9.6   7.1  3 X Average Risk  23.4   11.0        Use the calculated Patient Ratio above and the CHD Risk Table to determine the patient's CHD Risk.        ATP III CLASSIFICATION (LDL):  <100     mg/dL   Optimal  100-129  mg/dL   Near or Above                    Optimal  130-159  mg/dL   Borderline  160-189  mg/dL   High  >190     mg/dL   Very High   CBC with Diff     Status: Abnormal   Collection Time: 06/05/16 11:18 AM  Result Value Ref Range   WBC 6.4 4.0 -  10.5 K/uL   RBC 4.42 3.87 - 5.11 MIL/uL   Hemoglobin 13.7 12.0 - 15.0 g/dL   HCT 42.5 36.0 - 46.0 %   MCV 96.2 78.0 - 100.0 fL   MCH 31.0 26.0 - 34.0 pg   MCHC 32.2 30.0 - 36.0 g/dL   RDW 14.2 11.5 - 15.5 %   Platelets 125 (L) 150 - 400 K/uL   Neutrophils Relative % 62 %   Neutro Abs 4.0 1.7 - 7.7 K/uL   Lymphocytes Relative 29 %   Lymphs Abs 1.8 0.7 - 4.0 K/uL   Monocytes Relative 7 %   Monocytes Absolute 0.4 0.1 - 1.0 K/uL   Eosinophils Relative 2 %   Eosinophils Absolute 0.1 0.0 - 0.7  K/uL   Basophils Relative 0 %   Basophils Absolute 0.0 0.0 - 0.1 K/uL  Protime-INR     Status: Abnormal   Collection Time: 06/05/16 11:18 AM  Result Value Ref Range   Prothrombin Time 15.9 (H) 11.6 - 15.2 seconds   INR 1.25 0.00 - 1.49    Imaging results:  Dg Chest 2 View  06/05/2016  CLINICAL DATA:  COPD exacerbation, chronic bronchitis EXAM: CHEST  2 VIEW COMPARISON:  Chest x-ray of 05/06/2014 FINDINGS: No active infiltrate or effusion is seen. Mediastinal and hilar contours are unremarkable. The heart is within normal limits in size. No acute bony abnormality is seen. IMPRESSION: No active cardiopulmonary disease. Electronically Signed   By: Ivar Drape M.D.   On: 06/05/2016 13:43   Assessment & Plan by Problem: Principal Problem:   Worsening cough and shortness of breath Active Problems:   Thrombocytopenia   Diabetes Mellitus, Type II   Hypertension   Hyperlipidemia   Chronic Pain   Diastolic Heart Failure   Depression  55 year-old female with a history of COPD, tobacco abuse, HTN, HLD, PVD, and diastolic heart failure who presents to with a 7 day history of worsening shortness of breath and cough with a sore throat, runny nose, and wheezing.  Chest pain only occurs with cough.  On exam she is found to be afebrile and hypoxemic to 89% on room air with diffuse expiratory wheezes heard throughout her bilateral lung fields.  Her CXR was negative for new infiltrates, pulmonary edema, and cardiomegaly.    # Cough and shortness of breath:  Hypoxemic at 89% on room air on arrival.  Diffuse expiratory wheezes throughout her bilateral lung fields.  Differential includes COPD exacerbation, acute on chronic heart failure, pneumonia, MI, and pulmonary embolism.  Most likely etiology is a COPD exacerbation from recent upper respiratory tract infection (likely viral) given her associated runny nose and sore throat.  AoC CHF less likely with a normal CXR and absence of crackles on exam.  Pneumonia  not very likely without fever and leukocytosis and absence of infiltrates on CXR.  Low suspicion of MI due to chest pain only with cough and slow onset of symptoms.  Pulmonary embolism highly unlikely because of the insidious onset of symptoms.  Meets criteria for antibiotic therapy (increased dyspnea, increased sputum volume, cardiac disease) - f/u BNP  - f/u EKG - Oxygen - azithromycin 500 mg daily PO  - IV methylprednisolone 125 mg - Duonebs - PT evaluation - Smoking cessation  # Thrombocytopenia:  Platelets 125,000 on admission.  Ranged from 137-144 over the past 2 years. Dr. [name] is following them and believes likely etiology is NASH. - Monitor CBC tomorrow - Workup as outpatient  # Type II DM:  A1c 8.7.  On Humalog (75-25) 135 units am and 70 pm at home.  Poorly controlled glucose could be causing her polyuria.  Will get UA to rule out UTI - f/u UA - Humalog (75-25) 45 am, 20 pm and SSI  # Hypertension:  147/59 on arrival.  At home on atenolol 100 mg daily, HCTZ 25 mg daily, benazepril 40 mg daily, amlodipine 10 mg daily, furosemide 40 mg prn.   - Hold lasix - Continue atenolol and amlodipine  # Hyperlipidemia: Lipid panel significant for HDL 34.  Home atorvastatin 80 mg daily, rosuvastatin 20 mg daily - rosuvastatin 20 mg PO daily    # Chronic pain: On lyrica 200 mg TID, baclofen 10 mg TID prn, and Percocet prn - continue home meds  # Depression: On trazadone 100 mg prn and sertraline 100 mg daily - Continue home meds   Diet: carb modified  Prophylaxis: Lovenox  This is a Careers information officer Note.  The care of the patient was discussed with Dr. Lovena Le and the assessment and plan was formulated with their assistance.  Please see their note for official documentation of the patient encounter.   Signed: Jobie Quaker, Med Student 06/05/2016, 2:30 PM

## 2016-06-05 NOTE — Progress Notes (Signed)
Patient arrived on unit via wheel chair.  Patient refused to have complete skin assessment. Patient did allow me to remove sock and exam right foot.  Right 5th toe has foam dressing covering dark toe with ulcer.

## 2016-06-06 DIAGNOSIS — J42 Unspecified chronic bronchitis: Secondary | ICD-10-CM | POA: Diagnosis not present

## 2016-06-06 DIAGNOSIS — J441 Chronic obstructive pulmonary disease with (acute) exacerbation: Secondary | ICD-10-CM | POA: Diagnosis not present

## 2016-06-06 DIAGNOSIS — E1165 Type 2 diabetes mellitus with hyperglycemia: Secondary | ICD-10-CM | POA: Diagnosis not present

## 2016-06-06 DIAGNOSIS — E1140 Type 2 diabetes mellitus with diabetic neuropathy, unspecified: Secondary | ICD-10-CM | POA: Diagnosis not present

## 2016-06-06 DIAGNOSIS — I110 Hypertensive heart disease with heart failure: Secondary | ICD-10-CM | POA: Diagnosis not present

## 2016-06-06 DIAGNOSIS — I5032 Chronic diastolic (congestive) heart failure: Secondary | ICD-10-CM | POA: Diagnosis not present

## 2016-06-06 LAB — CBC
HCT: 44 % (ref 36.0–46.0)
Hemoglobin: 14.3 g/dL (ref 12.0–15.0)
MCH: 31.2 pg (ref 26.0–34.0)
MCHC: 32.5 g/dL (ref 30.0–36.0)
MCV: 96.1 fL (ref 78.0–100.0)
Platelets: 139 10*3/uL — ABNORMAL LOW (ref 150–400)
RBC: 4.58 MIL/uL (ref 3.87–5.11)
RDW: 14.1 % (ref 11.5–15.5)
WBC: 8.5 10*3/uL (ref 4.0–10.5)

## 2016-06-06 LAB — GLUCOSE, CAPILLARY
Glucose-Capillary: 423 mg/dL — ABNORMAL HIGH (ref 65–99)
Glucose-Capillary: 525 mg/dL — ABNORMAL HIGH (ref 65–99)
Glucose-Capillary: 436 mg/dL — ABNORMAL HIGH (ref 65–99)
Glucose-Capillary: 491 mg/dL — ABNORMAL HIGH (ref 65–99)
Glucose-Capillary: 509 mg/dL — ABNORMAL HIGH (ref 65–99)
Glucose-Capillary: 321 mg/dL — ABNORMAL HIGH (ref 65–99)

## 2016-06-06 LAB — URINE MICROSCOPIC-ADD ON
Bacteria, UA: NONE SEEN
WBC, UA: NONE SEEN WBC/hpf (ref 0–5)

## 2016-06-06 LAB — URINALYSIS, ROUTINE W REFLEX MICROSCOPIC
Bilirubin Urine: NEGATIVE
Glucose, UA: >1000 mg/dL — AB
Ketones, ur: NEGATIVE mg/dL
Leukocytes, UA: NEGATIVE
Nitrite: NEGATIVE
Protein, ur: NEGATIVE mg/dL
Specific Gravity, Urine: 1.015 (ref 1.005–1.030)
pH: 5.5 (ref 5.0–8.0)

## 2016-06-06 MED ORDER — AZITHROMYCIN 500 MG PO TABS
250.0000 mg | ORAL_TABLET | Freq: Once | ORAL | Status: AC
  Filled 2016-06-06: qty 1

## 2016-06-06 MED ORDER — AZITHROMYCIN 250 MG PO TABS: ORAL_TABLET | ORAL | Status: DC

## 2016-06-06 MED ORDER — PREDNISONE 20 MG PO TABS: 40.0000 mg | ORAL_TABLET | Freq: Every day | ORAL | Status: DC

## 2016-06-06 MED ORDER — INSULIN ASPART PROT & ASPART (70-30 MIX) 100 UNIT/ML ~~LOC~~ SUSP
65.0000 [IU] | Freq: Every day | SUBCUTANEOUS | Status: DC
  Filled 2016-06-06: qty 10

## 2016-06-06 MED ORDER — FUROSEMIDE 10 MG/ML IJ SOLN
40.0000 mg | Freq: Once | INTRAMUSCULAR | Status: AC
  Administered 2016-06-06: 40 mg via INTRAVENOUS
  Filled 2016-06-06: qty 4

## 2016-06-06 MED ORDER — IPRATROPIUM-ALBUTEROL 0.5-2.5 (3) MG/3ML IN SOLN
3.0000 mL | Freq: Four times a day (QID) | RESPIRATORY_TRACT | Status: DC | PRN
  Administered 2016-06-06: 3 mL via RESPIRATORY_TRACT

## 2016-06-06 MED ORDER — AZITHROMYCIN 500 MG PO TABS
250.0000 mg | ORAL_TABLET | Freq: Once | ORAL | Status: AC
  Administered 2016-06-06: 250 mg via ORAL
  Filled 2016-06-06: qty 1

## 2016-06-06 MED ORDER — INSULIN ASPART 100 UNIT/ML ~~LOC~~ SOLN: 20.0000 [IU] | Freq: Every evening | SUBCUTANEOUS | Status: DC | PRN

## 2016-06-06 MED ORDER — INSULIN ASPART PROT & ASPART (70-30 MIX) 100 UNIT/ML ~~LOC~~ SUSP: 65.0000 [IU] | Freq: Every day | SUBCUTANEOUS | Status: DC

## 2016-06-06 MED ORDER — IPRATROPIUM-ALBUTEROL 0.5-2.5 (3) MG/3ML IN SOLN
3.0000 mL | Freq: Three times a day (TID) | RESPIRATORY_TRACT | Status: DC
  Administered 2016-06-07 – 2016-06-08 (×4): 3 mL via RESPIRATORY_TRACT
  Filled 2016-06-06 (×4): qty 3

## 2016-06-06 MED ORDER — INSULIN ASPART PROT & ASPART (70-30 MIX) 100 UNIT/ML ~~LOC~~ SUSP
65.0000 [IU] | Freq: Every day | SUBCUTANEOUS | Status: DC
  Administered 2016-06-07: 65 [IU] via SUBCUTANEOUS

## 2016-06-06 MED ORDER — AZITHROMYCIN 500 MG PO TABS
250.0000 mg | ORAL_TABLET | Freq: Every day | ORAL | Status: DC
  Administered 2016-06-07 – 2016-06-08 (×2): 250 mg via ORAL
  Filled 2016-06-06 (×2): qty 1

## 2016-06-06 MED ORDER — PREDNISONE 20 MG PO TABS
40.0000 mg | ORAL_TABLET | Freq: Every day | ORAL | Status: DC
Start: 2016-06-07 — End: 2016-06-08
  Administered 2016-06-07 – 2016-06-08 (×2): 40 mg via ORAL
  Filled 2016-06-06 (×2): qty 2

## 2016-06-06 MED ORDER — INSULIN ASPART PROT & ASPART (70-30 MIX) 100 UNIT/ML ~~LOC~~ SUSP
25.0000 [IU] | Freq: Once | SUBCUTANEOUS | Status: AC
  Administered 2016-06-06: 25 [IU] via SUBCUTANEOUS

## 2016-06-06 MED ORDER — INSULIN ASPART PROT & ASPART (70-30 MIX) 100 UNIT/ML ~~LOC~~ SUSP
65.0000 [IU] | Freq: Every day | SUBCUTANEOUS | Status: DC
  Administered 2016-06-06: 65 [IU] via SUBCUTANEOUS
  Filled 2016-06-06: qty 10

## 2016-06-06 MED ORDER — INSULIN ASPART PROT & ASPART (70-30 MIX) 100 UNIT/ML ~~LOC~~ SUSP
40.0000 [IU] | Freq: Every day | SUBCUTANEOUS | Status: DC
  Filled 2016-06-06: qty 10

## 2016-06-06 MED ORDER — INSULIN ASPART PROT & ASPART (70-30 MIX) 100 UNIT/ML ~~LOC~~ SUSP
40.0000 [IU] | Freq: Every day | SUBCUTANEOUS | Status: DC
  Administered 2016-06-06: 40 [IU] via SUBCUTANEOUS
  Filled 2016-06-06: qty 10

## 2016-06-06 MED ORDER — IPRATROPIUM-ALBUTEROL 0.5-2.5 (3) MG/3ML IN SOLN
RESPIRATORY_TRACT | Status: AC
  Filled 2016-06-06: qty 3

## 2016-06-06 NOTE — Care Management Important Message (Signed)
Important Message  Patient Details  Name: Jennifer [name] MRN: [id number] Date of Birth: 08/04/1961   Medicare Important Message Given:  Yes    [name] 06/06/2016, 9:33 AM

## 2016-06-06 NOTE — Progress Notes (Signed)
Subjective: No acute events overnight.  Patient reports mild improvement in breathing and cough.  Denies chest pain.  Denies fever and chills.  Tolerating a carb restricted diet.    Objective: Vital signs in last 24 hours: Filed Vitals:   06/05/16 2034 06/06/16 0527 06/06/16 0814 06/06/16 0836  BP:  165/61  150/70  Pulse:  66  80  Temp:  98.4 F (36.9 C)  97.5 F (36.4 C)  TempSrc:  Oral  Oral  Resp:  20  18  Weight:  116.4 kg (256 lb 9.9 oz)    SpO2: 98% 98% 95% 89%   Weight change:   Intake/Output Summary (Last 24 hours) at 06/06/16 1039 Last data filed at 06/05/16 1853  Gross per 24 hour  Intake    480 ml  Output      0 ml  Net    480 ml   Physical Exam: General: Obese, uncomfortable appearing, sitting in chair in NAD.   CV: Regular rate and rhythm without murmurs. Heart sounds distant Lungs: Mild, diffuse expiratory wheezes bilaterally. Improved from yesterday.  Mild bibasilar crackles present.  Mildly increased work of breathing Abd: soft, mild diffuse tenderness, nondistended.  No guarding or rigidity Ext: 1+ edema in RLE, left BKA  Neuro:  Alert and oriented x3 Psych: flat affect  Lab Results:  Recent Labs Lab 06/05/16 1118 06/06/16 0729  WBC 6.4 8.5  HGB 13.7 14.3  HCT 42.5 44.0  PLT 125* 139*    Recent Labs Lab 06/05/16 1118  NA 139  K 4.0  CL 104  CO2 28  BUN 9  CREATININE 0.75  CALCIUM 8.6*  GLUCOSE 297*   Studies/Results: Dg Chest 2 View  06/05/2016  CLINICAL DATA:  COPD exacerbation, chronic bronchitis EXAM: CHEST  2 VIEW COMPARISON:  Chest x-ray of 05/06/2014 FINDINGS: No active infiltrate or effusion is seen. Mediastinal and hilar contours are unremarkable. The heart is within normal limits in size. No acute bony abnormality is seen. IMPRESSION: No active cardiopulmonary disease. Electronically Signed   By: Ivar Drape M.D.   On: 06/05/2016 13:43   Medications: I have reviewed the patient's current medications. Scheduled Meds: .  amLODipine  10 mg Oral Daily  . atenolol  100 mg Oral Daily  . enoxaparin (LOVENOX) injection  40 mg Subcutaneous Q24H  . insulin aspart  0-20 Units Subcutaneous TID WC  . insulin aspart protamine- aspart  20 Units Subcutaneous Q supper  . [START ON 06/07/2016] Insulin Lispro Prot & Lispro  65 Units Subcutaneous QAC breakfast  . ipratropium-albuterol  3 mL Nebulization Q6H  . methylPREDNISolone (SOLU-MEDROL) injection  125 mg Intravenous Daily  . mometasone-formoterol  2 puff Inhalation BID  . polyethylene glycol  17 g Oral Daily  . pregabalin  200 mg Oral TID  . rosuvastatin  20 mg Oral QHS  . sertraline  100 mg Oral Daily   Continuous Infusions:  PRN Meds:.baclofen, fluticasone, guaiFENesin-dextromethorphan, oxyCODONE-acetaminophen, traZODone Assessment/Plan: Principal Problem:  Acute COPD exacerbation Active Problems:  Diabetes Mellitus, Type II  Hypertension  Hyperlipidemia  Chronic Pain  Diastolic Heart Failure  Depression   Thrombocytopenia  55 year-old female with a history of COPD, tobacco abuse, HTN, HLD, PVD, and diastolic heart failure who presents to with a 7 day history of worsening shortness of breath and cough with a sore throat, runny nose, and wheezing. Chest pain only occurs with cough. On exam she is found to be afebrile and hypoxemic to 89% on room air with diffuse expiratory  wheezes heard throughout her bilateral lung fields. Her CXR was negative for new infiltrates, pulmonary edema, and cardiomegaly. EKG showed normal sinus rhythm without ST segment or T wave abnormalities.  # Acute COPD exacerbation, likely secondary to viral URI: Hypoxemic at 89% on room air on arrival. Diffuse expiratory wheezes throughout her bilateral lung fields.  Bibasilar crackles and right lower extremity edema suggest CHF may be exacerbating her symptoms.  CXR on arrival negative for new infiltrates, pulmonary edema, and cardiomegaly.  EKG showed normal sinus rhythm without ST  segment or T wave abnormalities.  BNP elevated to 431.  Meets criteria for antibiotic therapy (increased dyspnea, increased sputum volume, cardiac disease).  Was given IV methylprednisolone 125 mg x1. - Furosemide 40 mg IV x 1 - azithromycin 500 mg daily PO  - Transition to prednisone 40 mg PO daily - Duonebs - PT evaluation - Smoking cessation  # Type II DM: A1c 8.7 on arrival. On Humalog (75-25) 135 units am and 70 pm at home. Poorly controlled glucose could be causing her polyuria. Will get UA to rule out UTI.  Blood sugars have ranged from 300-400s.   - f/u UA - Increased insulin to Humalog (75-25) 65 am, 40 pm and SSI  # Hypertension: 147/59 on arrival. At home on atenolol 100 mg daily, HCTZ 25 mg daily, benazepril 40 mg daily, amlodipine 10 mg daily, furosemide 40 mg prn.  - Continue atenolol and amlodipine  # Hyperlipidemia: Lipid panel significant for HDL 34. Home atorvastatin 80 mg daily, rosuvastatin 20 mg daily - rosuvastatin 20 mg PO daily   # Chronic pain: On lyrica 200 mg TID, baclofen 10 mg TID prn, and Percocet prn - continue home meds  # Depression: On trazadone 100 mg prn and sertraline 100 mg daily - Continue home meds   # Thrombocytopenia: Platelets 125,000 on admission. Up trending on recheck to 139,000.  Ranged from 137-144 over the past 2 years. Dr. [name] is following them and believes likely etiology is NASH. - Monitor CBC tomorrow - Workup as outpatient  Diet: carb modified  Prophylaxis: Lovenox  This is a Careers information officer Note.  The care of the patient was discussed with Dr. Tiburcio Pea and the assessment and plan formulated with their assistance.  Please see their attached note for official documentation of the daily encounter.   LOS: 1 day   Jobie Quaker, Med Student 06/06/2016, 10:39 AM

## 2016-06-06 NOTE — Progress Notes (Signed)
06/06/2016 11:57 AM  Attending RN alerted this RN that patient CBG was 525. Confirmed by NT, CBG 525. Paged MD. Awaiting orders.   [employer], RN-BC, [facility] Phone [phone]

## 2016-06-06 NOTE — Progress Notes (Signed)
Subjective:  Says breathing has somewhat improved. Still has some edema in legs. Does not feel ready to go today.    Objective: Vital signs in last 24 hours: Filed Vitals:   06/05/16 2034 06/06/16 0527 06/06/16 0814 06/06/16 0836  BP:  165/61  150/70  Pulse:  66  80  Temp:  98.4 F (36.9 C)  97.5 F (36.4 C)  TempSrc:  Oral  Oral  Resp:  20  18  Weight:  256 lb 9.9 oz (116.4 kg)    SpO2: 98% 98% 95% 89%   Weight change:   Intake/Output Summary (Last 24 hours) at 06/06/16 1102 Last data filed at 06/05/16 1853  Gross per 24 hour  Intake    480 ml  Output      0 ml  Net    480 ml   General: Vital signs reviewed. Patient in no acute distress Cardiovascular: regular rate, rhythm, no murmur appreciated  Pulmonary/Chest: Clear to auscultation bilaterally,  Some wheezing  Abdominal: Soft, non-tender, non-distended, BS + Extremities: left BKA.  2+ pitting edema in both     Lab Results: Results for orders placed or performed during the hospital encounter of 06/05/16 (from the past 24 hour(s))  Brain natriuretic peptide     Status: Abnormal   Collection Time: 06/05/16  3:58 PM  Result Value Ref Range   B Natriuretic Peptide 431.2 (H) 0.0 - 100.0 pg/mL  Glucose, capillary     Status: Abnormal   Collection Time: 06/05/16  5:14 PM  Result Value Ref Range   Glucose-Capillary 329 (H) 65 - 99 mg/dL  Glucose, capillary     Status: Abnormal   Collection Time: 06/05/16  8:33 PM  Result Value Ref Range   Glucose-Capillary 295 (H) 65 - 99 mg/dL  CBC     Status: Abnormal   Collection Time: 06/06/16  7:29 AM  Result Value Ref Range   WBC 8.5 4.0 - 10.5 K/uL   RBC 4.58 3.87 - 5.11 MIL/uL   Hemoglobin 14.3 12.0 - 15.0 g/dL   HCT 44.0 36.0 - 46.0 %   MCV 96.1 78.0 - 100.0 fL   MCH 31.2 26.0 - 34.0 pg   MCHC 32.5 30.0 - 36.0 g/dL   RDW 14.1 11.5 - 15.5 %   Platelets 139 (L) 150 - 400 K/uL  Glucose, capillary     Status: Abnormal   Collection Time: 06/06/16  8:33 AM  Result  Value Ref Range   Glucose-Capillary 423 (H) 65 - 99 mg/dL   *Note: Due to a large number of results and/or encounters for the requested time period, some results have not been displayed. A complete set of results can be found in Results Review.    Micro Results: No results found for this or any previous visit (from the past 240 hour(s)). Studies/Results: Dg Chest 2 View  06/05/2016  CLINICAL DATA:  COPD exacerbation, chronic bronchitis EXAM: CHEST  2 VIEW COMPARISON:  Chest x-ray of 05/06/2014 FINDINGS: No active infiltrate or effusion is seen. Mediastinal and hilar contours are unremarkable. The heart is within normal limits in size. No acute bony abnormality is seen. IMPRESSION: No active cardiopulmonary disease. Electronically Signed   By: Ivar Drape M.D.   On: 06/05/2016 13:43   Medications: I have reviewed the patient's current medications. Scheduled Meds: . amLODipine  10 mg Oral Daily  . atenolol  100 mg Oral Daily  . enoxaparin (LOVENOX) injection  40 mg Subcutaneous Q24H  . insulin aspart  0-20 Units Subcutaneous TID WC  . insulin aspart protamine- aspart  20 Units Subcutaneous Q supper  . [START ON 06/07/2016] Insulin Lispro Prot & Lispro  65 Units Subcutaneous QAC breakfast  . ipratropium-albuterol  3 mL Nebulization Q6H  . methylPREDNISolone (SOLU-MEDROL) injection  125 mg Intravenous Daily  . mometasone-formoterol  2 puff Inhalation BID  . polyethylene glycol  17 g Oral Daily  . pregabalin  200 mg Oral TID  . rosuvastatin  20 mg Oral QHS  . sertraline  100 mg Oral Daily   Continuous Infusions:  PRN Meds:.baclofen, fluticasone, guaiFENesin-dextromethorphan, oxyCODONE-acetaminophen, traZODone Assessment/Plan: Principal Problem:   COPD with acute exacerbation ([place])  Ms. Pardo is a 55 yo female with DMII, HTN, HLD, PVD s/p left BKA, HFpEF (EF 65-70% and grade 1 diastolic dysfunction; 1916), and GOLD Stage 2 COPD, presenting with 1 week h/o worsening wheezing, cough, and  SOB.   Acute COPD exacerbation: Likely viral etiology. She met all 3 criteria for COPDex. She received solumedrol once, and also azithromycin and duonebs. Her breathing has subjectively improved from yesterday, and on exam, minimal wheezing heard. Given that she complained of some shortness of breath and evidence of pitting edema on exam, we will give her one time IV lasix dose before discharging her tomorrow   -transitioned to prednisone 40 mg x 5 days - duonebs -Zpak, end date June 19th -lasix   T2DM:  Last A1c 8.7% on 06/05/16. Overnight, her CBGs have been in the 300 to low 400s. She takes 135 units of 75/25 in the morning and 85 units in the afternoon. Given that her CBG was high, we have ordered 65 units in the morning, and 40 units in the afternoon.   -70/30  65 units at breakfast, and 40 units at dinner   HTN: slightly hypertensive at 150/70  - continue amlodipine, atenolol -Given lasix 40 mg IV today  HFpEF/ Lower extremity edema   -Given 40 mg IV lasix -I&O    Dispo: Disposition is deferred at this time, awaiting improvement of current medical problems.  Anticipated discharge in approximately 1 day(s).   The patient does have a current PCP Bartholomew Crews, MD) and does need an [facility] hospital follow-up appointment after discharge.  The patient does not have transportation limitations that hinder transportation to clinic appointments.  .Services Needed at time of discharge: Y = Yes, Blank = No PT:   OT:   RN:   Equipment:   Other:     LOS: 1 day   Burgess Estelle, MD 06/06/2016, 11:02 AM

## 2016-06-06 NOTE — Progress Notes (Signed)
Inpatient Diabetes Program Recommendations  AACE/ADA: New Consensus Statement on Inpatient Glycemic Control (2015)  Target Ranges:  Prepandial:   less than 140 mg/dL      Peak postprandial:   less than 180 mg/dL (1-2 hours)      Critically ill patients:  140 - 180 mg/dL   Lab Results  Component Value Date   GLUCAP 436* 06/06/2016   HGBA1C 8.7 06/05/2016    Review of Glycemic Control  Results for DEVLYNN, PEARL (MRN [id number]) as of 06/06/2016 13:40  Ref. Range 06/05/2016 17:14 06/05/2016 20:33 06/06/2016 08:33 06/06/2016 11:33 06/06/2016 13:13  Glucose-Capillary Latest Ref Range: 65-99 mg/dL 329 (H) 295 (H) 423 (H) 525 (H) 436 (H)    Diabetes history: Type 2 Outpatient Diabetes medications: Home med list states; Humalog mix 75/25 135units qam, 85 units qpm Current orders for Inpatient glycemic control: Novolog mix 70/30 65 units with lunch, 40 units with supper, Novolog resistant correction scale (0-20 units) tid with meals.  Inpatient Diabetes Program Recommendations:   Blood sugars very elevated. Pre- supper insulin was not given last evening and no 70/30 insulin this morning with breakfast. Please change am 70/30 Novolog mix to 100 units and order it pre- breakfast.  Consider increasing Novolog mix 70/30 at supper to 64 units.   Gentry Fitz, RN, BA, MHA, CDE Diabetes Coordinator Inpatient Diabetes Program  [phone] (Team Pager) [phone] ([place]) 06/06/2016 1:51 PM

## 2016-06-06 NOTE — Plan of Care (Signed)
Problem: Acute Rehab PT Goals(only PT should resolve) Goal: Pt Will Transfer Bed To Chair/Chair To Bed With O2 sats maintained above 90% Goal: Pt Will Ambulate With O2 sats above 90% Goal: Pt Will Go Up/Down Stairs With O2 sats above 90%

## 2016-06-06 NOTE — Evaluation (Signed)
Physical Therapy Evaluation Patient Details Name: Jennifer [name] MRN: [id number] DOB: 02-Dec-1961 Today's Date: 06/06/2016   History of Present Illness  55 yo female with onset of COPD flare up and hsitory of PVD was admitted.  PMHx:  L BKA, COPD, DVT, CHF, PVD, prosthetic fitting  Clinical Impression  Pt will need to try steps to get home safely as she is home alone and no clear plan for help to be there to get in.  Her plof was very good but may not have been as safe, although she states no falls in last year.  Will follow acutely to verify she can get into the house safely.    Follow Up Recommendations Home health PT    Equipment Recommendations  None recommended by PT    Recommendations for Other Services Rehab consult     Precautions / Restrictions Precautions Precautions: Fall (telemetry) Restrictions Weight Bearing Restrictions: No      Mobility  Bed Mobility               General bed mobility comments: up when PT entered  Transfers Overall transfer level: Needs assistance Equipment used: Rolling walker (2 wheeled);1 person hand held assist Transfers: Sit to/from [facility] Sit to Stand: Min guard Stand pivot transfers: Min guard       General transfer comment: pt is accustomed to using her walker alone but needed some cues  Ambulation/Gait Ambulation/Gait assistance: Min guard Ambulation Distance (Feet): 15 Feet Assistive device: Rolling walker (2 wheeled);1 person hand held assist Gait Pattern/deviations: Step-to pattern;Step-through pattern;Decreased stride length;Wide base of support;Decreased weight shift to left Gait velocity: reduced Gait velocity interpretation: Below normal speed for age/gender General Gait Details: LLE prosthesis with some looseness on her leg  Stairs            Wheelchair Mobility    Modified Rankin (Stroke Patients Only)       Balance Overall balance assessment: Needs  assistance Sitting-balance support: Feet supported Sitting balance-Leahy Scale: Good   Postural control: Posterior lean Standing balance support: Bilateral upper extremity supported Standing balance-Leahy Scale: Fair                               Pertinent Vitals/Pain Pain Assessment: No/denies pain    Home Living Family/patient expects to be discharged to:: Private residence Living Arrangements: Alone (per pt) Available Help at Discharge: Family;Available PRN/intermittently Type of Home: House Home Access: Stairs to enter Entrance Stairs-Rails: Right Entrance Stairs-Number of Steps: 2+5 Home Layout: One level Home Equipment: Bedside commode;Shower seat;Walker - 2 wheels;Other (comment) (LLE prosthesis)      Prior Function Level of Independence: Independent with assistive device(s)         Comments: states she walks only short distances but uses walker to get to BR and around house     Hand Dominance        Extremity/Trunk Assessment   Upper Extremity Assessment: Overall WFL for tasks assessed           Lower Extremity Assessment: Generalized weakness      Cervical / Trunk Assessment: Normal  Communication   Communication: No difficulties (Pt has some comprehension issues that resolved)  Cognition Arousal/Alertness: Awake/alert Behavior During Therapy: WFL for tasks assessed/performed;Agitated Overall Cognitive Status: Within Functional Limits for tasks assessed                      General Comments General  comments (skin integrity, edema, etc.): using prosthesis and not needing help to don although she aggressively stamps L leg to don.  Tried to caution pt about this and she insists she is fine with donning leg.    Exercises        Assessment/Plan    PT Assessment Patient needs continued PT services  PT Diagnosis Generalized weakness   PT Problem List Decreased strength;Decreased range of motion;Decreased activity  tolerance;Decreased balance;Decreased mobility;Decreased knowledge of use of DME;Decreased safety awareness;Cardiopulmonary status limiting activity;Obesity;Decreased skin integrity  PT Treatment Interventions DME instruction;Gait training;Stair training;Functional mobility training;Therapeutic activities;Therapeutic exercise;Balance training;Neuromuscular re-education;Patient/family education   PT Goals (Current goals can be found in the Care Plan section) Acute Rehab PT Goals Patient Stated Goal: to get home and manage herself PT Goal Formulation: With patient Time For Goal Achievement: 06/20/16 Potential to Achieve Goals: Good    Frequency Min 2X/week   Barriers to discharge Inaccessible home environment (many steps to enter house) will need to try steps before going home    Co-evaluation               End of Session   Activity Tolerance: Patient tolerated treatment well;Patient limited by fatigue Patient left: in chair;with call bell/phone within reach Nurse Communication: Mobility status         Time: [id number] PT Time Calculation (min) (ACUTE ONLY): 19 min   Charges:   PT Evaluation $PT Eval Low Complexity: 1 Procedure     PT G CodesRamond Dial June 21, 2016, 10:55 AM    Mee Hives, PT MS Acute Rehab Dept. Number: [place] [facility] [place]

## 2016-06-06 NOTE — Progress Notes (Signed)
06/06/2016 12:07 PM  MD Patel returned paged. New orders written. Check eMAR. Alerted attending RN and Charge RN of new orders.  [employer], RN-BC, [facility] Phone [phone]

## 2016-06-06 NOTE — Care Management Obs Status (Addendum)
[place] NOTIFICATION   Patient Details  Name: Jennifer [name] MRN: [id number] Date of Birth: 1961/09/20  Patient does not meet inpatient guidelines according to Medicare guideline Condition Code 44 issued. MOON letter given, CM provided patient with explanation, patient is agreeable with charnge.  Medicare Observation Status Notification Given:  Ernesta Amble, RN 06/06/2016, 11:35 PM

## 2016-06-07 DIAGNOSIS — J441 Chronic obstructive pulmonary disease with (acute) exacerbation: Secondary | ICD-10-CM | POA: Diagnosis not present

## 2016-06-07 DIAGNOSIS — F329 Major depressive disorder, single episode, unspecified: Secondary | ICD-10-CM

## 2016-06-07 DIAGNOSIS — I110 Hypertensive heart disease with heart failure: Secondary | ICD-10-CM | POA: Diagnosis not present

## 2016-06-07 DIAGNOSIS — E1165 Type 2 diabetes mellitus with hyperglycemia: Secondary | ICD-10-CM | POA: Diagnosis not present

## 2016-06-07 DIAGNOSIS — E1140 Type 2 diabetes mellitus with diabetic neuropathy, unspecified: Secondary | ICD-10-CM

## 2016-06-07 DIAGNOSIS — I5032 Chronic diastolic (congestive) heart failure: Secondary | ICD-10-CM | POA: Diagnosis not present

## 2016-06-07 DIAGNOSIS — Z79891 Long term (current) use of opiate analgesic: Secondary | ICD-10-CM

## 2016-06-07 DIAGNOSIS — J42 Unspecified chronic bronchitis: Secondary | ICD-10-CM | POA: Diagnosis not present

## 2016-06-07 LAB — BASIC METABOLIC PANEL
Anion gap: 11 (ref 5–15)
BUN: 28 mg/dL — ABNORMAL HIGH (ref 6–20)
CO2: 25 mmol/L (ref 22–32)
Calcium: 8.7 mg/dL — ABNORMAL LOW (ref 8.9–10.3)
Chloride: 101 mmol/L (ref 101–111)
Creatinine, Ser: 1.07 mg/dL — ABNORMAL HIGH (ref 0.44–1.00)
GFR calc Af Amer: >60 mL/min (ref >60)
GFR calc non Af Amer: 57 mL/min — ABNORMAL LOW (ref >60)
Glucose, Bld: 347 mg/dL — ABNORMAL HIGH (ref 65–99)
Potassium: 4.2 mmol/L (ref 3.5–5.1)
Sodium: 137 mmol/L (ref 135–145)

## 2016-06-07 LAB — GLUCOSE, CAPILLARY
Glucose-Capillary: 417 mg/dL — ABNORMAL HIGH (ref 65–99)
Glucose-Capillary: 393 mg/dL — ABNORMAL HIGH (ref 65–99)
Glucose-Capillary: 243 mg/dL — ABNORMAL HIGH (ref 65–99)
Glucose-Capillary: 322 mg/dL — ABNORMAL HIGH (ref 65–99)

## 2016-06-07 MED ORDER — DM-GUAIFENESIN ER 30-600 MG PO TB12
1.0000 | ORAL_TABLET | Freq: Two times a day (BID) | ORAL | Status: DC
  Administered 2016-06-07 – 2016-06-08 (×3): 1 via ORAL
  Filled 2016-06-07 (×3): qty 1

## 2016-06-07 MED ORDER — INSULIN ASPART PROT & ASPART (70-30 MIX) 100 UNIT/ML ~~LOC~~ SUSP
100.0000 [IU] | Freq: Every day | SUBCUTANEOUS | Status: DC
  Administered 2016-06-07 – 2016-06-08 (×2): 100 [IU] via SUBCUTANEOUS

## 2016-06-07 MED ORDER — DM-GUAIFENESIN ER 30-600 MG PO TB12: 1.0000 | ORAL_TABLET | Freq: Two times a day (BID) | ORAL | Status: DC

## 2016-06-07 MED ORDER — BENZONATATE 100 MG PO CAPS
200.0000 mg | ORAL_CAPSULE | Freq: Two times a day (BID) | ORAL | Status: DC | PRN
  Administered 2016-06-07 – 2016-06-08 (×2): 200 mg via ORAL
  Filled 2016-06-07 (×3): qty 2

## 2016-06-07 NOTE — Progress Notes (Signed)
Subjective: No acute events overnight.  Patient reports improvement in her shortness of breath and cough yesterday after her sterids and duonebs, but claims later in the day her symptoms worsened.  Patient admits to urinary incontinence when she gets a really bad coughing spell.  She is continuing to endorse chest and abdominal soreness secondary to cough.  No fever or chills.  No other complaints.   Objective: Vital signs in last 24 hours: Filed Vitals:   06/06/16 1856 06/06/16 2039 06/06/16 2047 06/07/16 0628  BP: 148/70  155/74 117/67  Pulse: 86  67 89  Temp: 97.9 F (36.6 C)  97.6 F (36.4 C) 97.5 F (36.4 C)  TempSrc: Oral  Oral Oral  Resp: 19  18 18   Weight:   122.1 kg (269 lb 2.9 oz)   SpO2: 96% 99% 96% 98%   Weight change: 5.7 kg (12 lb 9.1 oz)  Intake/Output Summary (Last 24 hours) at 06/07/16 0818 Last data filed at 06/07/16 [id number]  Gross per 24 hour  Intake    820 ml  Output   2350 ml  Net  -1530 ml   Physical Exam: General: Obese, uncomfortable appearing, sitting in chair in NAD. Coughing during exam CV: Regular rate and rhythm without murmurs. Heart sounds distant Lungs: Clear to auscultation bilaterally, no wheezes or crackles.  Normal work of breathing Abd: soft, mild diffuse tenderness, nondistended. No guarding or rigidity Ext: 1+ edema in RLE, left BKA  Neuro: Alert and oriented x3 Psych: flat affect  Lab Results:  Recent Labs Lab 06/05/16 1118 06/06/16 0729  WBC 6.4 8.5  HGB 13.7 14.3  HCT 42.5 44.0  PLT 125* 139*    Recent Labs Lab 06/05/16 1118 06/07/16 0540  NA 139 137  K 4.0 4.2  CL 104 101  CO2 28 25  BUN 9 28*  CREATININE 0.75 1.07*  CALCIUM 8.6* 8.7*  GLUCOSE 297* 347*   Studies/Results: Dg Chest 2 View  06/05/2016  CLINICAL DATA:  COPD exacerbation, chronic bronchitis EXAM: CHEST  2 VIEW COMPARISON:  Chest x-ray of 05/06/2014 FINDINGS: No active infiltrate or effusion is seen. Mediastinal and hilar contours are  unremarkable. The heart is within normal limits in size. No acute bony abnormality is seen. IMPRESSION: No active cardiopulmonary disease. Electronically Signed   By: Ivar Drape M.D.   On: 06/05/2016 13:43   Medications: I have reviewed the patient's current medications. Scheduled Meds: . amLODipine  10 mg Oral Daily  . atenolol  100 mg Oral Daily  . azithromycin  250 mg Oral Daily  . dextromethorphan-guaiFENesin  1 tablet Oral BID  . enoxaparin (LOVENOX) injection  40 mg Subcutaneous Q24H  . insulin aspart  0-20 Units Subcutaneous TID WC  . insulin aspart protamine- aspart  100 Units Subcutaneous Q breakfast  . insulin aspart protamine- aspart  65 Units Subcutaneous Q supper  . ipratropium-albuterol  3 mL Nebulization TID  . mometasone-formoterol  2 puff Inhalation BID  . polyethylene glycol  17 g Oral Daily  . predniSONE  40 mg Oral Q breakfast  . pregabalin  200 mg Oral TID  . rosuvastatin  20 mg Oral QHS  . sertraline  100 mg Oral Daily   Continuous Infusions:  PRN Meds:.baclofen, fluticasone, insulin aspart, ipratropium-albuterol, oxyCODONE-acetaminophen, traZODone Assessment/Plan: Principal Problem:  Acute COPD exacerbation Active Problems:  Diabetes Mellitus, Type II  Hypertension  Hyperlipidemia  Chronic Pain  Diastolic Heart Failure  Depression  Thrombocytopenia  55 year-old female with a history of COPD, tobacco  abuse, HTN, HLD, PVD, and diastolic heart failure who presents to with a 7 day history of worsening shortness of breath and cough with a sore throat, runny nose, and wheezing. Chest pain only occurs with cough. On exam she is found to be afebrile and hypoxemic to 89% on room air with diffuse expiratory wheezes heard throughout her bilateral lung fields. Her CXR was negative for new infiltrates, pulmonary edema, and cardiomegaly. EKG showed normal sinus rhythm without ST segment or T wave abnormalities.  # Acute COPD exacerbation, likely secondary to  viral URI: Hypoxemic at 89% on room air on arrival. Diffuse expiratory wheezes throughout her bilateral lung fields. Bibasilar crackles and right lower extremity edema suggest CHF may be exacerbating her symptoms. CXR on arrival negative for new infiltrates, pulmonary edema, and cardiomegaly. EKG showed normal sinus rhythm without ST segment or T wave abnormalities. BNP elevated to 431. Meets criteria for antibiotic therapy (increased dyspnea, increased sputum volume, cardiac disease). Was given IV methylprednisolone 125 mg x1.  I/O -1.5 L after lasix 40 IV x1 - Continue with azithromycin  - Transition to prednisone 40 mg PO daily x 5 days (started 6/16) - Duonebs - Dulera BID - PT evaluation- recommend home health PT 2 x per week - Smoking cessation  # Type II DM: A1c 8.7 on arrival. On Humalog (75-25) 135 units am and 70 pm at home. Poorly controlled glucose could be causing her polyuria. U/A negative for UTI. Blood sugars have ranged from 300-400s.  - Increased insulin to Humalog (75-25) 100 units with breakfast, 65 units with dinner and SSI  # Hypertension: 147/59 on arrival. At home on atenolol 100 mg daily, HCTZ 25 mg daily, benazepril 40 mg daily, amlodipine 10 mg daily, furosemide 40 mg prn.  - Continue atenolol and amlodipine  # Hyperlipidemia: Lipid panel significant for HDL 34. Home atorvastatin 80 mg daily, rosuvastatin 20 mg daily - rosuvastatin 20 mg PO daily   # Chronic pain: On lyrica 200 mg TID, baclofen 10 mg TID prn, and Percocet prn - continue home meds  # Depression: On trazadone 100 mg prn and sertraline 100 mg daily - Continue home meds   # Thrombocytopenia: Platelets 125,000 on admission. Up trending on recheck to 139,000. Ranged from 137-144 over the past 2 years. Dr. [name] is following them and believes likely etiology is NASH. - Monitor CBC tomorrow - Workup as outpatient  Diet: carb modified  Prophylaxis: Lovenox  This is a Location manager Note.  The care of the patient was discussed with Dr. Posey Pronto and the assessment and plan formulated with their assistance.  Please see their attached note for official documentation of the daily encounter.   LOS: 2 days   Jobie Quaker, Med Student 06/07/2016, 8:18 AM

## 2016-06-07 NOTE — Progress Notes (Signed)
Patient seen and examined. Case d/w residents in detail. I agree with findings and plan as documented in Dr. Serita Grit note.  Patient with persistent cough but no wheezing. Will complete prednisone course and azithromycin for COPD exacerbation. Started on guaifenisin for cough. Likely d/c home in AM

## 2016-06-07 NOTE — Progress Notes (Signed)
Subjective: This morning, she does not feel well enough to go home. Her cough is really bothersome to her. She received 1 dose of cough syrup last night. She also feels her sugars have been out of which which I accounted for mistiming of insulin doses and steroid therapy for her COPD flare.    Objective: Vital signs in last 24 hours: Filed Vitals:   06/06/16 1856 06/06/16 2039 06/06/16 2047 06/07/16 0628  BP: 148/70  155/74 117/67  Pulse: 86  67 89  Temp: 97.9 F (36.6 C)  97.6 F (36.4 C) 97.5 F (36.4 C)  TempSrc: Oral  Oral Oral  Resp: 19  18 18   Weight:   269 lb 2.9 oz (122.1 kg)   SpO2: 96% 99% 96% 98%   Weight change: 12 lb 9.1 oz (5.7 kg)  Intake/Output Summary (Last 24 hours) at 06/07/16 0735 Last data filed at 06/07/16 [id number]  Gross per 24 hour  Intake    820 ml  Output   2350 ml  Net  -1530 ml   General: Vital signs reviewed. Patient in no acute distress Cardiovascular: regular rate, rhythm, no murmur appreciated  Pulmonary/Chest: End-expiratory wheezing today associated with coughing and rhonchi Abdominal: Soft, non-tender, non-distended, BS + Extremities: Left BKA.  2+ pitting edema at below the knee though improved from yesterday  Lab Results: Results for orders placed or performed during the hospital encounter of 06/05/16 (from the past 24 hour(s))  Glucose, capillary     Status: Abnormal   Collection Time: 06/06/16  8:33 AM  Result Value Ref Range   Glucose-Capillary 423 (H) 65 - 99 mg/dL  Glucose, capillary     Status: Abnormal   Collection Time: 06/06/16 11:33 AM  Result Value Ref Range   Glucose-Capillary 525 (H) 65 - 99 mg/dL  Urinalysis, Routine w reflex microscopic (not at [facility])     Status: Abnormal   Collection Time: 06/06/16 11:36 AM  Result Value Ref Range   Color, Urine YELLOW YELLOW   APPearance CLEAR CLEAR   Specific Gravity, Urine 1.015 1.005 - 1.030   pH 5.5 5.0 - 8.0   Glucose, UA >1000 (A) NEGATIVE mg/dL   Hgb urine dipstick SMALL  (A) NEGATIVE   Bilirubin Urine NEGATIVE NEGATIVE   Ketones, ur NEGATIVE NEGATIVE mg/dL   Protein, ur NEGATIVE NEGATIVE mg/dL   Nitrite NEGATIVE NEGATIVE   Leukocytes, UA NEGATIVE NEGATIVE  Urine microscopic-add on     Status: Abnormal   Collection Time: 06/06/16 11:36 AM  Result Value Ref Range   Squamous Epithelial / LPF 0-5 (A) NONE SEEN   WBC, UA NONE SEEN 0 - 5 WBC/hpf   RBC / HPF 0-5 0 - 5 RBC/hpf   Bacteria, UA NONE SEEN NONE SEEN   Urine-Other YEAST PRESENT   Glucose, capillary     Status: Abnormal   Collection Time: 06/06/16  1:13 PM  Result Value Ref Range   Glucose-Capillary 436 (H) 65 - 99 mg/dL  Glucose, capillary     Status: Abnormal   Collection Time: 06/06/16  3:44 PM  Result Value Ref Range   Glucose-Capillary 491 (H) 65 - 99 mg/dL   Comment 1 Notify RN    Comment 2 Document in Chart   Glucose, capillary     Status: Abnormal   Collection Time: 06/06/16  5:50 PM  Result Value Ref Range   Glucose-Capillary 509 (H) 65 - 99 mg/dL   Comment 1 Notify RN    Comment 2 Document in Chart  Glucose, capillary     Status: Abnormal   Collection Time: 06/06/16  9:00 PM  Result Value Ref Range   Glucose-Capillary 321 (H) 65 - 99 mg/dL  Basic metabolic panel     Status: Abnormal   Collection Time: 06/07/16  5:40 AM  Result Value Ref Range   Sodium 137 135 - 145 mmol/L   Potassium 4.2 3.5 - 5.1 mmol/L   Chloride 101 101 - 111 mmol/L   CO2 25 22 - 32 mmol/L   Glucose, Bld 347 (H) 65 - 99 mg/dL   BUN 28 (H) 6 - 20 mg/dL   Creatinine, Ser 1.07 (H) 0.44 - 1.00 mg/dL   Calcium 8.7 (L) 8.9 - 10.3 mg/dL   GFR calc non Af Amer 57 (L) >60 mL/min   GFR calc Af Amer >60 >60 mL/min   Anion gap 11 5 - 15   *Note: Due to a large number of results and/or encounters for the requested time period, some results have not been displayed. A complete set of results can be found in Results Review.    Micro Results: No results found for this or any previous visit (from the past 240  hour(s)). Studies/Results: Dg Chest 2 View  06/05/2016  CLINICAL DATA:  COPD exacerbation, chronic bronchitis EXAM: CHEST  2 VIEW COMPARISON:  Chest x-ray of 05/06/2014 FINDINGS: No active infiltrate or effusion is seen. Mediastinal and hilar contours are unremarkable. The heart is within normal limits in size. No acute bony abnormality is seen. IMPRESSION: No active cardiopulmonary disease. Electronically Signed   By: Ivar Drape M.D.   On: 06/05/2016 13:43   Medications: I have reviewed the patient's current medications. Scheduled Meds: . amLODipine  10 mg Oral Daily  . atenolol  100 mg Oral Daily  . azithromycin  250 mg Oral Daily  . enoxaparin (LOVENOX) injection  40 mg Subcutaneous Q24H  . insulin aspart  0-20 Units Subcutaneous TID WC  . insulin aspart protamine- aspart  100 Units Subcutaneous Q breakfast  . insulin aspart protamine- aspart  65 Units Subcutaneous Q supper  . ipratropium-albuterol  3 mL Nebulization TID  . mometasone-formoterol  2 puff Inhalation BID  . polyethylene glycol  17 g Oral Daily  . predniSONE  40 mg Oral Q breakfast  . pregabalin  200 mg Oral TID  . rosuvastatin  20 mg Oral QHS  . sertraline  100 mg Oral Daily   Continuous Infusions:  PRN Meds:.baclofen, fluticasone, guaiFENesin-dextromethorphan, insulin aspart, ipratropium-albuterol, oxyCODONE-acetaminophen, traZODone Assessment/Plan: Principal Problem:   COPD with acute exacerbation (HCC) Active Problems:   Hyperlipidemia associated with type 2 diabetes mellitus ([place])   Hypertension associated with diabetes ([place])   Secondary diabetes mellitus with peripheral vascular disease ([place])   Opioid dependence (HCC)   Severe obesity (BMI >= 40) (HCC)   S/P BKA (below knee amputation) unilateral (HCC)   Type 2 diabetes with nephropathy ([place])   Thrombocytopenia ([place])  Jennifer [name] is a 55 yo female with DMII, HTN, HLD, PVD s/p left BKA, HFpEF (EF 65-70% and grade 1 diastolic dysfunction; 123456), and GOLD Stage  2 COPD hospitalized for acute COPD exacerbation.   Acute COPD exacerbation: Likely viral etiology. She received solumedrol once and also azithromycin and duonebs. Her breathing has subjectively improved from yesterday, and on exam, minimal wheezing heard. Adequate diuresis given increased BUN/Crt ratio. -Continue prednisone 40mg  [Day 3/5] -Continue Duonebs -Continue Zpak N[place]ie.[place] 3/5] -Continue mometasone/formoterol 2 puffs twice daily -Start dextromethorphan/guaifenisen 30/600 mg tablets twice daily -Start benzonatate  200mg  twice daily as needed for cough  Poorly controlled Type 2 diabetes complicated by neuropathy:  Last A1c 8.7% on 06/05/16. CBGs have been trending 300s-500s yesterday due to incorrectly scheduled insulin, concurrent steroid therapy. -Give Novolog 70/30 100 units before breakfast and 65 units before dinner -Discontinued cough syrup  -Continue Lyrica 200mg  three times daily  Hypertension: BP trending 117-155/67-74. -Continue amlodipine 10mg  daily -Continue atenolol 100mg  daily -Continue rosuvastatin 20mg  at bedtime  Opioid dependence: Continue oxycodone/acetaminophen 5/325mg  to be taken every 1-2 tablets for 4 hours as needed.  HFpEF: Peripheral edema improved today and will hold off on diuresis as noted above.  Depression: Continue sertraline 100mg  daily.  Dispo: Disposition is deferred at this time, awaiting improvement of current medical problems.  Anticipated discharge in approximately 1 day(s).   The patient does have a current PCP Bartholomew Crews, MD) and does need an [facility] hospital follow-up appointment after discharge.  The patient does not have transportation limitations that hinder transportation to clinic appointments.  .Services Needed at time of discharge: Y = Yes, Blank = No PT:   OT:   RN:   Equipment:   Other:     LOS: 2 days   Jennifer Dubin, MD 06/07/2016, 7:35 AM

## 2016-06-08 DIAGNOSIS — J42 Unspecified chronic bronchitis: Secondary | ICD-10-CM | POA: Diagnosis not present

## 2016-06-08 DIAGNOSIS — I5032 Chronic diastolic (congestive) heart failure: Secondary | ICD-10-CM | POA: Diagnosis not present

## 2016-06-08 DIAGNOSIS — J441 Chronic obstructive pulmonary disease with (acute) exacerbation: Secondary | ICD-10-CM | POA: Diagnosis not present

## 2016-06-08 LAB — BASIC METABOLIC PANEL
Anion gap: 6 (ref 5–15)
BUN: 27 mg/dL — ABNORMAL HIGH (ref 6–20)
CO2: 29 mmol/L (ref 22–32)
Calcium: 8.6 mg/dL — ABNORMAL LOW (ref 8.9–10.3)
Chloride: 105 mmol/L (ref 101–111)
Creatinine, Ser: 0.84 mg/dL (ref 0.44–1.00)
GFR calc Af Amer: >60 mL/min (ref >60)
GFR calc non Af Amer: >60 mL/min (ref >60)
Glucose, Bld: 141 mg/dL — ABNORMAL HIGH (ref 65–99)
Potassium: 3.6 mmol/L (ref 3.5–5.1)
Sodium: 140 mmol/L (ref 135–145)

## 2016-06-08 LAB — GLUCOSE, CAPILLARY
Glucose-Capillary: 141 mg/dL — ABNORMAL HIGH (ref 65–99)
Glucose-Capillary: 82 mg/dL (ref 65–99)

## 2016-06-08 MED ORDER — AZITHROMYCIN 250 MG PO TABS: 250.0000 mg | ORAL_TABLET | Freq: Every day | ORAL | Status: AC

## 2016-06-08 MED ORDER — DIPHENHYDRAMINE HCL 25 MG PO TABS: 50.0000 mg | ORAL_TABLET | Freq: Every evening | ORAL | Status: DC | PRN

## 2016-06-08 MED ORDER — BENZONATATE 200 MG PO CAPS: 200.0000 mg | ORAL_CAPSULE | Freq: Three times a day (TID) | ORAL | Status: DC | PRN

## 2016-06-08 MED ORDER — PREDNISONE 20 MG PO TABS: 40.0000 mg | ORAL_TABLET | Freq: Every day | ORAL | Status: DC

## 2016-06-08 MED ORDER — DM-GUAIFENESIN ER 30-600 MG PO TB12: 1.0000 | ORAL_TABLET | Freq: Two times a day (BID) | ORAL | Status: DC | PRN

## 2016-06-08 NOTE — Discharge Summary (Signed)
Name: Jennifer [name] MRN: [id number] DOB: [birth year] 55 y.o. PCP: Bartholomew Crews, MD  Date of Admission: 06/05/2016  2:54 PM Date of Discharge: 06/08/2016 Attending Physician: Aldine Contes, MD  Discharge Diagnosis: Principal Problem:   COPD with acute exacerbation ([place]) Active Problems:   Hyperlipidemia associated with type 2 diabetes mellitus ([place])   Hypertension associated with diabetes ([place])   Secondary diabetes mellitus with peripheral vascular disease ([place])   Opioid dependence ([place])   Severe obesity (BMI >= 40) (HCC)   S/P BKA (below knee amputation) unilateral (HCC)   Type 2 diabetes with nephropathy ([place])   Thrombocytopenia ([place])  Discharge Medications:   Medication List    STOP taking these medications        guaiFENesin-dextromethorphan 100-10 MG/5ML syrup  Commonly known as:  ROBITUSSIN DM  Replaced by:  dextromethorphan-guaiFENesin 30-600 MG 12hr tablet     rosuvastatin 20 MG tablet  Commonly known as:  CRESTOR      TAKE these medications        ADVAIR DISKUS 250-50 MCG/DOSE Aepb  Generic drug:  Fluticasone-Salmeterol  TAKE 1 INHALATION BY MOUTH TWICE DAILY     albuterol 108 (90 Base) MCG/ACT inhaler  Commonly known as:  PROVENTIL HFA;VENTOLIN HFA  Inhale 2 puffs into the lungs every 6 (six) hours as needed for wheezing or shortness of breath.     albuterol (2.5 MG/3ML) 0.083% nebulizer solution  Commonly known as:  PROVENTIL  INHALE THE CONTENTS OF 1 VIAL VIA NEBULIZER EVERY 6 HOURS AS NEEDED FOR WHEEZING     PROAIR HFA 108 (90 Base) MCG/ACT inhaler  Generic drug:  albuterol  INHALE 2 PUFFS BY MOUTH EVERY 6 HOURS AS NEEDED FOR WHEEZING     amLODipine 10 MG tablet  Commonly known as:  NORVASC  Take 1 tablet (10 mg total) by mouth daily.     atenolol 100 MG tablet  Commonly known as:  TENORMIN  TAKE 1 TABLET BY MOUTH ONCE DAILY     atorvastatin 80 MG tablet  Commonly known as:  LIPITOR  Take 1 tablet (80 mg total) by mouth daily.     azithromycin 250 MG tablet  Commonly known as:  ZITHROMAX  Take 1 tablet (250 mg total) by mouth daily.     baclofen 10 MG tablet  Commonly known as:  LIORESAL  Take 10 mg by mouth 3 (three) times daily. Take every day per patient     baclofen 10 MG tablet  Commonly known as:  LIORESAL  TAKE 1 TABLET BY MOUTH THREE TIMES A DAY AS NEEDED     benazepril 40 MG tablet  Commonly known as:  LOTENSIN  TAKE 1 TABLET BY MOUTH DAILY     benzonatate 200 MG capsule  Commonly known as:  TESSALON  Take 1 capsule (200 mg total) by mouth 3 (three) times daily as needed for cough.     clotrimazole-betamethasone cream  Commonly known as:  LOTRISONE  Apply to affected area under breasts 2 times daily     dextromethorphan-guaiFENesin 30-600 MG 12hr tablet  Commonly known as:  MUCINEX DM  Take 1 tablet by mouth 2 (two) times daily as needed for cough.     diphenhydrAMINE 25 MG tablet  Commonly known as:  BENADRYL  Take 2 tablets (50 mg total) by mouth at bedtime as needed.     EASY TOUCH PEN NEEDLES 31G X 8 MM Misc  Generic drug:  Insulin Pen Needle  USE TO INJECT INSULIN TWICE  DAILY     fluticasone 50 MCG/ACT nasal spray  Commonly known as:  FLONASE  PLACE 1 SPRAY INTO BOTH NOSTRILS DAILY     furosemide 40 MG tablet  Commonly known as:  LASIX  Take 1 tablet (40 mg total) by mouth daily as needed.     HUMALOG MIX 75/25 KWIKPEN (75-25) 100 UNIT/ML Kwikpen  Generic drug:  Insulin Lispro Prot & Lispro  INJECT 135 UNITS SUBCUTANEOUSLY IN THE MORNING BEFORE BREAKFAST & INJECT 70 UNITS SUBCUTANEOUSLY IN THE EVENING BEFORE EVENING MEAL     hydrochlorothiazide 25 MG tablet  Commonly known as:  HYDRODIURIL  Take 1 tablet (25 mg total) by mouth daily.     ipratropium 0.02 % nebulizer solution  Commonly known as:  ATROVENT  USE 1 VIAL VIA NEBULIZER EVERY 6 HOURS AS NEEDED FOR WHEEZING     lidocaine 5 %  Commonly known as:  LIDODERM  Place 1 patch onto the skin daily. Remove & Discard  patch within 12 hours or as directed by MD     LYRICA 200 MG capsule  Generic drug:  pregabalin  TAKE 1 CAPSULE BY MOUTH THREE TIMES A DAY     NEOSPORIN ORIGINAL [phone] Oint  Apply to spot on neck twice a day     nystatin powder  Commonly known as:  MYCOSTATIN  APPLY TO AFFECTED AREA THREE TIMES A DAY     ONETOUCH DELICA LANCETS [id number] Misc  USE TO CHECK BLOOD SUGARS FOUR TIMES A DAY     ONETOUCH VERIO test strip  Generic drug:  glucose blood  USE 3-4 TIMES DAILY TO CHECK BLOOD SUGAR     oxyCODONE-acetaminophen 5-325 MG tablet  Commonly known as:  PERCOCET/ROXICET  Take 1-2 tablets by mouth every 4 (four) hours as needed for severe pain.     potassium chloride SA 20 MEQ tablet  Commonly known as:  K-DUR,KLOR-CON  TAKE 2 TABLETS BY MOUTH DAILY     predniSONE 20 MG tablet  Commonly known as:  DELTASONE  Take 2 tablets (40 mg total) by mouth daily with breakfast.     ranitidine 150 MG tablet  Commonly known as:  ZANTAC  TAKE 1 TABLET BY MOUTH TWICE DAILY     sertraline 100 MG tablet  Commonly known as:  ZOLOFT  Take 1 tablet (100 mg total) by mouth daily.     travoprost (benzalkonium) 0.004 % ophthalmic solution  Commonly known as:  TRAVATAN  Place 1 drop into both eyes at bedtime.     traZODone 100 MG tablet  Commonly known as:  DESYREL  TAKE 1 TABLET BY MOUTH DAILY AT BEDTIME AS NEEDED FOR SLEEP        Disposition and follow-up:   Jennifer [name] was discharged from [facility] in Stable condition.    Acute COPD ex: Has the patient finished her antibiotics? T2DM:  Please verify her insulin use.  HTN: Re-assess blood pressure LE Edema: is she taking her lasix?   Follow-up Appointments:     Follow-up Information    Follow up with Lucious Groves, DO. Go on 06/19/2016.   Specialty:  Internal Medicine   Why:  10:45 AM for hospital follow up   Contact information:   [place]  [id number] [phone]        Follow up with [facility].   Why:  Home health physical and occupational therapy and nurse   Contact information:   [address]  [place] [address] [phone]       Discharge Instructions: Discharge Instructions    Call MD for:  difficulty breathing, headache or visual disturbances    Complete by:  As directed      Call MD for:  persistant dizziness or light-headedness    Complete by:  As directed      Call MD for:  temperature >100.4    Complete by:  As directed      Diet - low sodium heart healthy    Complete by:  As directed      Discharge instructions    Complete by:  As directed   You were hospitalized for a viral infection that caused your COPD to act up. Please continue taking the antibiotic and steroids until you run out.   For the cough, we are prescribing two different kinds of tablets. We expect the cough to get better over the next few days. Cutting back on the cigarettes can help with making the cough get better.  We will have you follow-up in our clinic sometime in the next 1-2 weeks.     Increase activity slowly    Complete by:  As directed      Increase activity slowly    Complete by:  As directed            Consultations:    Procedures Performed:  Dg Chest 2 View  06/05/2016  CLINICAL DATA:  COPD exacerbation, chronic bronchitis EXAM: CHEST  2 VIEW COMPARISON:  Chest x-ray of 05/06/2014 FINDINGS: No active infiltrate or effusion is seen. Mediastinal and hilar contours are unremarkable. The heart is within normal limits in size. No acute bony abnormality is seen. IMPRESSION: No active cardiopulmonary disease. Electronically Signed   By: Ivar Drape M.D.   On: 06/05/2016 13:43   Admission HPI:   Jennifer [name] is a 55 yo female with DMII, HTN, HLD, PVD s/p left BKA, HFpEF (EF 65-70% and grade 1 diastolic dysfunction; 123456), and GOLD Stage 2 COPD, presenting with 1 week h/o worsening wheezing, cough, and SOB. She reports cough  productive of yellow-tan sputum. She normally uses her Albuterol inhaler 1-2 times daily, but has used it 6x daily for the last week. She endorses runny nose, congestion, and muscle pains, but denies sick contacts, fevers, or chills. She denies recent antibiotics or hospitalizations. She continues to smoke 1 ppd. She endorses sharp, burning chest pain with cough. She endorses worsening leg swelling for the last 2 days and increased cough when lying flat. She denies weight gain. She is prescribed Lasix, but has not taken a dose for the last week because she will already urinate 40x per day when not taking Lasix. She has had this polyuria and some urinary incontinence for the last 3 months. She denies dysuria or polydipsia. She has had some diarrhea for the last 2 days.  In the clinic, she was satting 89% on RA.  Hospital Course by problem list: Principal Problem:   COPD with acute exacerbation ([place]) Active Problems:   Hyperlipidemia associated with type 2 diabetes mellitus ([place])   Hypertension associated with diabetes ([place])   Secondary diabetes mellitus with peripheral vascular disease (HCC)   Opioid dependence ([place])   Severe obesity (BMI >= 40) (HCC)   S/P BKA (below knee amputation) unilateral (HCC)   Type 2 diabetes with nephropathy (HCC)   Thrombocytopenia (HCC)   Acute COPD Exacerbation: Likely 2/2 viral illness. She improved with Nebulizer treatments, IV steroids, and  home medications. Given the increased sputum production and cough associated with her flare, she was also given a five-day course of azithromycin to be taken 06/10/16. She was transitioned to oral prednisone with last dose to be taken 06/09/16. Cough was predominant symptom, so she was prescribed guaifenesin/dextromethorphan 30/600 mg tablets to be taken every 12 hours and benzonatate 200 mg to be taken 3 times daily as needed.  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  CBGs  Were initially in the 400s but improved after tighter blood sugar control after resuming home dose.   HTN: Blood pressure was well controlled on amlodipine and atenolol.  Opoid dependence: continued oxycodone-acetaminophen.  HFpEF: Given one time lasix and edema improved.   Depression: sertraline continued    Discharge Vitals:   BP 141/66 mmHg  Pulse 64  Temp(Src) 98.4 F (36.9 C) (Oral)  Resp 20  Wt 269 lb 2.9 oz (122.1 kg)  SpO2 98%  Discharge Labs:  Results for orders placed or performed during the hospital encounter of 06/05/16 (from the past 24 hour(s))  Glucose, capillary     Status: Abnormal   Collection Time: 06/07/16  4:39 PM  Result Value Ref Range   Glucose-Capillary 243 (H) 65 - 99 mg/dL   Comment 1 Notify RN    Comment 2 Document in Chart   Glucose, capillary     Status: Abnormal   Collection Time: 06/07/16  8:48 PM  Result Value Ref Range   Glucose-Capillary 322 (H) 65 - 99 mg/dL  Basic metabolic panel     Status: Abnormal   Collection Time: 06/08/16  3:38 AM  Result Value Ref Range   Sodium 140 135 - 145 mmol/L   Potassium 3.6 3.5 - 5.1 mmol/L   Chloride 105 101 - 111 mmol/L   CO2 29 22 - 32 mmol/L   Glucose, Bld 141 (H) 65 - 99 mg/dL   BUN 27 (H) 6 - 20 mg/dL   Creatinine, Ser 0.84 0.44 - 1.00 mg/dL   Calcium 8.6 (L) 8.9 - 10.3 mg/dL   GFR calc non Af Amer >60 >60 mL/min   GFR calc Af Amer >60 >60 mL/min   Anion gap 6 5 - 15  Glucose, capillary     Status: Abnormal   Collection Time: 06/08/16  8:14 AM  Result Value Ref Range   Glucose-Capillary 141 (H) 65 - 99 mg/dL  Glucose, capillary     Status: None   Collection Time: 06/08/16 11:55 AM  Result Value Ref Range   Glucose-Capillary 82 65 - 99 mg/dL   Comment 1 Notify RN    Comment 2 Document in Chart    *Note: Due to a large number of results and/or encounters for the requested time period, some results have not been displayed. A complete set of results can be found in Results Review.     Signed: Vernie Murders, MD 06/08/2016, 2:02 PM    Services Ordered on Discharge:  Equipment Ordered on Discharge:

## 2016-06-08 NOTE — Progress Notes (Signed)
Patient seen and examined. Case d/w residents in detail. I agree with findings and plan as documented in Dr. Serita Grit note.  Patient with persistent cough and scattered wheezes b/l on lung exam. I suspect the cough is secondary to her recent viral infection and not due to an underlying bacterial infection. Her COPD exacerbation is improving on azithromycin and prednisone. Will d/c home today with close f/u at [facility].   BS are much improved today. Outpatient follow up

## 2016-06-08 NOTE — Progress Notes (Signed)
CM received call from [place], Tommi Emery to please place [facility] orders and face to face.  CM paged MD to please place HHRN/PT/OT orders and face to face as pt set up with [facility] and CM consult from Lysbeth Galas, MD request arrangements for [facility] services.  No other CM needs were communicated.

## 2016-06-08 NOTE — Progress Notes (Signed)
Subjective: This morning, her son is at bedside. She still does not report feeling well on account of her cough though only asked for benzonatate once yesterday while on guaifenisen-dextromethorphan twice daily. We explained to her the risks of remaining hospitalized and that her cough would expect to improve over the next few days. She thanked [place] for her care and did feel that her wheezing and breathing had improved amidst the persistent cough.    Objective: Vital signs in last 24 hours: Filed Vitals:   06/07/16 2015 06/07/16 2044 06/08/16 0624 06/08/16 0729  BP:  175/79 160/75   Pulse: 81 63 57   Temp:  97.6 F (36.4 C) 98.2 F (36.8 C)   TempSrc:  Oral Oral   Resp: 20 18 20    Weight:      SpO2: 94% 93% 98% 97%   Weight change:   Intake/Output Summary (Last 24 hours) at 06/08/16 1007 Last data filed at 06/08/16 0649  Gross per 24 hour  Intake    480 ml  Output    780 ml  Net   -300 ml   General: Vital signs reviewed. Patient in no acute distress Cardiovascular: regular rate, rhythm, no murmur appreciated  Pulmonary/Chest: End-expiratory wheezing today associated with coughing and rhonchi though wheezing is improved Abdominal: Soft, non-tender, non-distended, BS + Extremities: Left BKA.  2+ pitting edema at below the knee, stable  Lab Results: Results for orders placed or performed during the hospital encounter of 06/05/16 (from the past 24 hour(s))  Glucose, capillary     Status: Abnormal   Collection Time: 06/07/16 11:01 AM  Result Value Ref Range   Glucose-Capillary 393 (H) 65 - 99 mg/dL  Glucose, capillary     Status: Abnormal   Collection Time: 06/07/16  4:39 PM  Result Value Ref Range   Glucose-Capillary 243 (H) 65 - 99 mg/dL   Comment 1 Notify RN    Comment 2 Document in Chart   Glucose, capillary     Status: Abnormal   Collection Time: 06/07/16  8:48 PM  Result Value Ref Range   Glucose-Capillary 322 (H) 65 - 99 mg/dL  Basic metabolic panel     Status:  Abnormal   Collection Time: 06/08/16  3:38 AM  Result Value Ref Range   Sodium 140 135 - 145 mmol/L   Potassium 3.6 3.5 - 5.1 mmol/L   Chloride 105 101 - 111 mmol/L   CO2 29 22 - 32 mmol/L   Glucose, Bld 141 (H) 65 - 99 mg/dL   BUN 27 (H) 6 - 20 mg/dL   Creatinine, Ser 0.84 0.44 - 1.00 mg/dL   Calcium 8.6 (L) 8.9 - 10.3 mg/dL   GFR calc non Af Amer >60 >60 mL/min   GFR calc Af Amer >60 >60 mL/min   Anion gap 6 5 - 15  Glucose, capillary     Status: Abnormal   Collection Time: 06/08/16  8:14 AM  Result Value Ref Range   Glucose-Capillary 141 (H) 65 - 99 mg/dL   *Note: Due to a large number of results and/or encounters for the requested time period, some results have not been displayed. A complete set of results can be found in Results Review.    Micro Results: No results found for this or any previous visit (from the past 240 hour(s)). Studies/Results: No results found. Medications: I have reviewed the patient's current medications. Scheduled Meds: . amLODipine  10 mg Oral Daily  . atenolol  100 mg Oral Daily  .  azithromycin  250 mg Oral Daily  . dextromethorphan-guaiFENesin  1 tablet Oral BID  . enoxaparin (LOVENOX) injection  40 mg Subcutaneous Q24H  . insulin aspart  0-20 Units Subcutaneous TID WC  . insulin aspart protamine- aspart  100 Units Subcutaneous Q breakfast  . insulin aspart protamine- aspart  65 Units Subcutaneous Q supper  . ipratropium-albuterol  3 mL Nebulization TID  . mometasone-formoterol  2 puff Inhalation BID  . polyethylene glycol  17 g Oral Daily  . predniSONE  40 mg Oral Q breakfast  . pregabalin  200 mg Oral TID  . rosuvastatin  20 mg Oral QHS  . sertraline  100 mg Oral Daily   Continuous Infusions:  PRN Meds:.baclofen, benzonatate, fluticasone, insulin aspart, ipratropium-albuterol, oxyCODONE-acetaminophen, traZODone Assessment/Plan: Principal Problem:   COPD with acute exacerbation (HCC) Active Problems:   Hyperlipidemia associated with  type 2 diabetes mellitus ([place])   Hypertension associated with diabetes ([place])   Secondary diabetes mellitus with peripheral vascular disease ([place])   Opioid dependence (HCC)   Severe obesity (BMI >= 40) (HCC)   S/P BKA (below knee amputation) unilateral (HCC)   Type 2 diabetes with nephropathy ([place])   Thrombocytopenia ([place])  Jennifer [name] is a 55 yo female with DMII, HTN, HLD, PVD s/p left BKA, HFpEF (EF 65-70% and grade 1 diastolic dysfunction; 123456), and GOLD Stage 2 COPD hospitalized for acute COPD exacerbation.   Acute COPD exacerbation: Likely viral etiology. She received solumedrol once and also azithromycin and duonebs. Her breathing has subjectively improved from yesterday, and on exam, minimal wheezing heard.  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 CBGs improved to 100s since yesterday due to incorrectly scheduled insulin, concurrent steroid therapy. -Give Novolog 70/30 100 units before breakfast and 65 units before dinner -Discontinued cough syrup  -Continue Lyrica 200mg  three times daily  Hypertension: BP trending 117-160/68-75. -Continue amlodipine 10mg  daily -Continue atenolol 100mg  daily -Continue rosuvastatin 20mg  at bedtime  Opioid dependence: Continue oxycodone/acetaminophen 5/325mg  to be taken every 1-2 tablets for 4 hours as needed.  HFpEF: Peripheral edema stable from yesterday. She does acknowledge non-adherence to her medications.  Depression: Continue sertraline 100mg  daily.  Dispo: Disposition is deferred at this time, awaiting improvement of current medical problems.  Anticipated discharge today.  The patient does have a current PCP Jennifer Crews, MD) and does need an [facility] hospital follow-up appointment after discharge.  The patient does not have transportation limitations that hinder transportation to clinic appointments.  .Services Needed at time of discharge: Y = Yes, Blank = No PT:   OT:   RN:   Equipment:   Other:     LOS: 3 days   Riccardo Dubin, MD 06/08/2016, 10:07 AM

## 2016-06-08 NOTE — Progress Notes (Signed)
Patient discharge teaching given, including activity, diet, follow-up appoints, and medications. Patient verbalized understanding of all discharge instructions. IV access was d/c'd. Vitals are stable. Skin is intact except as charted in most recent assessments. Pt to be escorted out by NT, to be driven home by family.  Chantrice Hagg, MBA, BSN, RN 

## 2016-06-09 ENCOUNTER — Telehealth: Payer: Self-pay | Admitting: Internal Medicine

## 2016-06-09 ENCOUNTER — Telehealth: Payer: Self-pay

## 2016-06-09 DIAGNOSIS — L97511 Non-pressure chronic ulcer of other part of right foot limited to breakdown of skin: Secondary | ICD-10-CM | POA: Diagnosis not present

## 2016-06-09 DIAGNOSIS — E1159 Type 2 diabetes mellitus with other circulatory complications: Secondary | ICD-10-CM | POA: Diagnosis not present

## 2016-06-09 DIAGNOSIS — J449 Chronic obstructive pulmonary disease, unspecified: Secondary | ICD-10-CM | POA: Diagnosis not present

## 2016-06-09 DIAGNOSIS — E1165 Type 2 diabetes mellitus with hyperglycemia: Secondary | ICD-10-CM | POA: Diagnosis not present

## 2016-06-09 DIAGNOSIS — I5032 Chronic diastolic (congestive) heart failure: Secondary | ICD-10-CM | POA: Diagnosis not present

## 2016-06-09 DIAGNOSIS — I159 Secondary hypertension, unspecified: Secondary | ICD-10-CM | POA: Diagnosis not present

## 2016-06-09 DIAGNOSIS — G894 Chronic pain syndrome: Secondary | ICD-10-CM | POA: Diagnosis not present

## 2016-06-09 DIAGNOSIS — H409 Unspecified glaucoma: Secondary | ICD-10-CM | POA: Diagnosis not present

## 2016-06-09 DIAGNOSIS — E11319 Type 2 diabetes mellitus with unspecified diabetic retinopathy without macular edema: Secondary | ICD-10-CM | POA: Diagnosis not present

## 2016-06-09 DIAGNOSIS — E1142 Type 2 diabetes mellitus with diabetic polyneuropathy: Secondary | ICD-10-CM | POA: Diagnosis not present

## 2016-06-09 DIAGNOSIS — E11621 Type 2 diabetes mellitus with foot ulcer: Secondary | ICD-10-CM | POA: Diagnosis not present

## 2016-06-09 DIAGNOSIS — E1139 Type 2 diabetes mellitus with other diabetic ophthalmic complication: Secondary | ICD-10-CM | POA: Diagnosis not present

## 2016-06-09 DIAGNOSIS — E1151 Type 2 diabetes mellitus with diabetic peripheral angiopathy without gangrene: Secondary | ICD-10-CM | POA: Diagnosis not present

## 2016-06-09 NOTE — Care Management Note (Signed)
Case Management Note  Patient Details  Name: Jennifer [name] MRN: [id number] Date of Birth: 09-08-1961  Subjective/Objective:            Exacerbation of COPD         Action/Plan: CM met with patient at bedside to discuss progression and discharge planning. Patient has a Lt AKA w/c bound,with multiple health issues,  she lives alone and is somewhat independent with family support. PCP Chinita Pester at the [facility]  Patient denies needing any equipment, has a neb machine at home to and understands how and when to use it. Also discussed the use of spacer for rescue inhaler and demonstrated the use, return demonstration observed. Patient states she is active with [facility] ([facility]) will resume [facility] services upon discharge.  Anticipate discharged home tomorrow with resumption of [facility] services. CM will follow up.   Expected Discharge Date:  06/07/16               Expected Discharge Plan:  [place]  In-House Referral:     Discharge planning Services  CM Consult  Post Acute Care Choice:  Home Health, Resumption of Svcs/PTA Provider Choice offered to:   yes  DME Arranged:    DME Agency:     HH Arranged:  RN, PT, OT HH Agency:  [place]  Status of Service:  Completed, signed off  Medicare Important Message Given:  Yes Date Medicare IM Given:    Medicare IM give by:    Date Additional Medicare IM Given:    Additional Medicare Important Message give by:     If discussed at [place] of Stay Meetings, dates discussed:    Additional CommentsLaurena Slimmer, RN 06/09/2016, 4:26 PM

## 2016-06-09 NOTE — Telephone Encounter (Signed)
Need TOC discharge dat e 06/06/16 HFU 06/19/16 °

## 2016-06-09 NOTE — Telephone Encounter (Signed)
Debra from [place] at [facility] requesting VO.

## 2016-06-10 NOTE — Telephone Encounter (Signed)
yes

## 2016-06-10 NOTE — Telephone Encounter (Signed)
VO given for 2x week for 3weeks, nsg care- manage meds and wound care, do you approve?

## 2016-06-11 NOTE — Telephone Encounter (Signed)
Transition Care Management Follow-up Telephone Call   Date discharged? Sunday pm   How have you been since you were released from the hospital?" Feeling as bad as i did before, my breathing is bad, coughing so much i wet and loose control of my bowels"   Do you understand why you were in the hospital? Yes, copd   Do you understand the discharge instructions? yes   Where were you discharged to? home   Items Reviewed:  Medications reviewed: yes  Allergies reviewed: yes  Dietary changes reviewed: yes  Referrals reviewed: yes   Functional Questionnaire:   Activities of Daily Living (ADLs):   She states they are independent in the following: self States they require assistance with the following: self   Any transportation issues/concerns?: yes, i need bus passes- will send to shana   Any patient concerns? Yes, transportation and paying for medicine   Confirmed importance and date/time of follow-up visits scheduled yes, 6/29 at 1045  Provider Appointment booked with  Confirmed with patient if condition begins to worsen call PCP or go to the ER.  Patient was given the office number and encouraged to call back with question or concerns.  : yes

## 2016-06-12 ENCOUNTER — Telehealth: Payer: Self-pay | Admitting: Licensed Clinical Social Worker

## 2016-06-12 DIAGNOSIS — J449 Chronic obstructive pulmonary disease, unspecified: Secondary | ICD-10-CM | POA: Diagnosis not present

## 2016-06-12 DIAGNOSIS — E1139 Type 2 diabetes mellitus with other diabetic ophthalmic complication: Secondary | ICD-10-CM | POA: Diagnosis not present

## 2016-06-12 DIAGNOSIS — E1151 Type 2 diabetes mellitus with diabetic peripheral angiopathy without gangrene: Secondary | ICD-10-CM | POA: Diagnosis not present

## 2016-06-12 DIAGNOSIS — E1159 Type 2 diabetes mellitus with other circulatory complications: Secondary | ICD-10-CM | POA: Diagnosis not present

## 2016-06-12 DIAGNOSIS — E1165 Type 2 diabetes mellitus with hyperglycemia: Secondary | ICD-10-CM | POA: Diagnosis not present

## 2016-06-12 DIAGNOSIS — I5032 Chronic diastolic (congestive) heart failure: Secondary | ICD-10-CM | POA: Diagnosis not present

## 2016-06-12 DIAGNOSIS — J441 Chronic obstructive pulmonary disease with (acute) exacerbation: Secondary | ICD-10-CM

## 2016-06-12 DIAGNOSIS — L97511 Non-pressure chronic ulcer of other part of right foot limited to breakdown of skin: Secondary | ICD-10-CM | POA: Diagnosis not present

## 2016-06-12 DIAGNOSIS — I159 Secondary hypertension, unspecified: Secondary | ICD-10-CM | POA: Diagnosis not present

## 2016-06-12 DIAGNOSIS — E1142 Type 2 diabetes mellitus with diabetic polyneuropathy: Secondary | ICD-10-CM | POA: Diagnosis not present

## 2016-06-12 DIAGNOSIS — E11319 Type 2 diabetes mellitus with unspecified diabetic retinopathy without macular edema: Secondary | ICD-10-CM | POA: Diagnosis not present

## 2016-06-12 DIAGNOSIS — H409 Unspecified glaucoma: Secondary | ICD-10-CM | POA: Diagnosis not present

## 2016-06-12 DIAGNOSIS — G894 Chronic pain syndrome: Secondary | ICD-10-CM | POA: Diagnosis not present

## 2016-06-12 DIAGNOSIS — E11621 Type 2 diabetes mellitus with foot ulcer: Secondary | ICD-10-CM | POA: Diagnosis not present

## 2016-06-12 NOTE — Telephone Encounter (Signed)
Jennifer [name] was referred to CSW as pt voiced need for bus passes for medical appointments during her TOC call.  CSW placed call to Jennifer [name] as follow up.  Pt states she utilizes [employer] transportation.  CSW informed Jennifer [name] does not have [employer] passes, but would refer to [facility] for eligibility for [employer] pass.  CSW also encouraged Jennifer [name] to contact [name] customer service number to inquire if her plan offers medical transportation.  CSW informed Jennifer [name] some [employer] policies provide free medical transportation.  CSW will be available during pt's 6/29 appointment to assist pt in contacting [facility] member services if needed.  Pt agreeable to referral to [facility] for assessment of eligibility for [employer] pass until pt can confirm with insurance company.

## 2016-06-12 NOTE — Telephone Encounter (Signed)
Please see CSW note 06/12/16

## 2016-06-16 DIAGNOSIS — E1159 Type 2 diabetes mellitus with other circulatory complications: Secondary | ICD-10-CM | POA: Diagnosis not present

## 2016-06-16 DIAGNOSIS — E1151 Type 2 diabetes mellitus with diabetic peripheral angiopathy without gangrene: Secondary | ICD-10-CM | POA: Diagnosis not present

## 2016-06-16 DIAGNOSIS — E11621 Type 2 diabetes mellitus with foot ulcer: Secondary | ICD-10-CM | POA: Diagnosis not present

## 2016-06-16 DIAGNOSIS — E1142 Type 2 diabetes mellitus with diabetic polyneuropathy: Secondary | ICD-10-CM | POA: Diagnosis not present

## 2016-06-16 DIAGNOSIS — E11319 Type 2 diabetes mellitus with unspecified diabetic retinopathy without macular edema: Secondary | ICD-10-CM | POA: Diagnosis not present

## 2016-06-16 DIAGNOSIS — L97511 Non-pressure chronic ulcer of other part of right foot limited to breakdown of skin: Secondary | ICD-10-CM | POA: Diagnosis not present

## 2016-06-16 DIAGNOSIS — H409 Unspecified glaucoma: Secondary | ICD-10-CM | POA: Diagnosis not present

## 2016-06-16 DIAGNOSIS — I159 Secondary hypertension, unspecified: Secondary | ICD-10-CM | POA: Diagnosis not present

## 2016-06-16 DIAGNOSIS — G894 Chronic pain syndrome: Secondary | ICD-10-CM | POA: Diagnosis not present

## 2016-06-16 DIAGNOSIS — E1165 Type 2 diabetes mellitus with hyperglycemia: Secondary | ICD-10-CM | POA: Diagnosis not present

## 2016-06-16 DIAGNOSIS — I5032 Chronic diastolic (congestive) heart failure: Secondary | ICD-10-CM | POA: Diagnosis not present

## 2016-06-16 DIAGNOSIS — J449 Chronic obstructive pulmonary disease, unspecified: Secondary | ICD-10-CM | POA: Diagnosis not present

## 2016-06-16 DIAGNOSIS — E1139 Type 2 diabetes mellitus with other diabetic ophthalmic complication: Secondary | ICD-10-CM | POA: Diagnosis not present

## 2016-06-16 NOTE — Telephone Encounter (Signed)
Hey, shana, pt forgot you called but she did call her insurance co and they cannot help her, she is afraid she will not be able to get here for her appt this week, can you once again help?

## 2016-06-17 ENCOUNTER — Telehealth: Payer: Self-pay

## 2016-06-17 ENCOUNTER — Other Ambulatory Visit: Payer: Self-pay

## 2016-06-17 DIAGNOSIS — L97511 Non-pressure chronic ulcer of other part of right foot limited to breakdown of skin: Secondary | ICD-10-CM | POA: Diagnosis not present

## 2016-06-17 DIAGNOSIS — E11319 Type 2 diabetes mellitus with unspecified diabetic retinopathy without macular edema: Secondary | ICD-10-CM | POA: Diagnosis not present

## 2016-06-17 DIAGNOSIS — I5032 Chronic diastolic (congestive) heart failure: Secondary | ICD-10-CM | POA: Diagnosis not present

## 2016-06-17 DIAGNOSIS — G894 Chronic pain syndrome: Secondary | ICD-10-CM | POA: Diagnosis not present

## 2016-06-17 DIAGNOSIS — E1139 Type 2 diabetes mellitus with other diabetic ophthalmic complication: Secondary | ICD-10-CM | POA: Diagnosis not present

## 2016-06-17 DIAGNOSIS — E1159 Type 2 diabetes mellitus with other circulatory complications: Secondary | ICD-10-CM | POA: Diagnosis not present

## 2016-06-17 DIAGNOSIS — E1151 Type 2 diabetes mellitus with diabetic peripheral angiopathy without gangrene: Secondary | ICD-10-CM | POA: Diagnosis not present

## 2016-06-17 DIAGNOSIS — E1142 Type 2 diabetes mellitus with diabetic polyneuropathy: Secondary | ICD-10-CM | POA: Diagnosis not present

## 2016-06-17 DIAGNOSIS — J449 Chronic obstructive pulmonary disease, unspecified: Secondary | ICD-10-CM | POA: Diagnosis not present

## 2016-06-17 DIAGNOSIS — E11621 Type 2 diabetes mellitus with foot ulcer: Secondary | ICD-10-CM | POA: Diagnosis not present

## 2016-06-17 DIAGNOSIS — H409 Unspecified glaucoma: Secondary | ICD-10-CM | POA: Diagnosis not present

## 2016-06-17 DIAGNOSIS — I159 Secondary hypertension, unspecified: Secondary | ICD-10-CM | POA: Diagnosis not present

## 2016-06-17 DIAGNOSIS — E1165 Type 2 diabetes mellitus with hyperglycemia: Secondary | ICD-10-CM | POA: Diagnosis not present

## 2016-06-17 NOTE — Telephone Encounter (Signed)
Please see [facility] telephone note 06/17/16.  Ms. Shore will need to make contact with [facility].

## 2016-06-17 NOTE — Telephone Encounter (Signed)
Chariffe from [facility] requesting VO. Please call back.

## 2016-06-17 NOTE — Patient Outreach (Signed)
[facility]) Care Management  06/17/2016  MAKAELA PLUNK 1961-08-31 [id number]   Telephone call to patient regarding social worker referral from [employer].  Unable to reach patient. HIPAA compliant voice message left with call back phone number.   PLAN: RNCM will attempt 2nd telephone call to patient within 1 week.  Quinn Plowman RN,BSN,CCM [facility] Telephonic  [phone]

## 2016-06-18 ENCOUNTER — Other Ambulatory Visit: Payer: Self-pay | Admitting: *Deleted

## 2016-06-18 ENCOUNTER — Other Ambulatory Visit: Payer: Self-pay

## 2016-06-18 DIAGNOSIS — J441 Chronic obstructive pulmonary disease with (acute) exacerbation: Secondary | ICD-10-CM

## 2016-06-18 DIAGNOSIS — Z748 Other problems related to care provider dependency: Secondary | ICD-10-CM

## 2016-06-18 NOTE — Telephone Encounter (Signed)
Spoke with [facility], they will call pt again after 1100

## 2016-06-18 NOTE — Patient Outreach (Signed)
[facility]) Care Management  06/18/2016  Jennifer [name] April 12, 1961 [id number]  REFERRAL SOURCE: Primary MD office/ Golden Hurter social worker REFERRAL REASON: Transportation assistance/ eligibility assessment for [employer] passes.  SUBJECTIVE; Telephone call to patient regarding primary MD referral.  HIPAA verified with patient. Discussed and offered [facility] care management services to patient. Patient verbally agreed to receive services.   TRANSPORTATION:  Patient states she needs transportation assistance for her appointment on tomorrow, 06/19/16 [facility]. Patient states she needs assistance with SC[employer] passes because of the transportation cost.  COPD:  Patient states she was recently in the hospital from 06/05/16 to 06/08/16 due to her COPD. Patient states she experiences a lot of shortness of breath, chest pain, and coughing with any activity.  Patient states she has all of her medications except for the cough syrup tessalon. Patient states she was unable to afford it. Patient states she only sees her primary MD for her COPD. Patient states she is not able to see a specialist for her COPD because she is unable to afford the $65.00 specialist co-pay.  FALLS: Patient states she is wheelchair bound. Patient reports she has had 2 falls within in the past year. 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   ASSESSMENT; Primary MD referral.  Patient will benefit from community case manager for management and education of COPD, and falls risk assessment. Patient will benefit from social worker referral to assist with transportation.  PLAN; RNCM will refer patient to community case Freight forwarder and Education officer, museum.   Quinn Plowman RN,BSN,CCM [facility] Telephonic  [phone]

## 2016-06-18 NOTE — Telephone Encounter (Signed)
VO for PT 2x week for 1 week and 1x week for 1 week given, do you agree?

## 2016-06-18 NOTE — Patient Outreach (Signed)
[facility]) Care Management  06/18/2016  Jennifer [name] [birth year] [id number]   CSW received a new referral on patient from Quinn Plowman, Telephonic Nurse Case Manager with [place] [facility], indicating that patient would benefit from transportation assistance to her physician appointment tomorrow (Thursday, June 19, 2016 at 10:45am).  Patient will be meeting with Dr. Dellia Nims, Primary Care Physician at the [facility], for a routine follow-up appointment.  CSW made a referral for patient to [employer], transportation services, through Freda Jackson, Care Management Assistant with [place] [facility].  No additional social work needs identified at this time.  Nat Christen, BSW, MSW, LCSW  Licensed Education officer, [facility]  Mailing [place] [address] [place], [place] [address] Physical Address-[address], [place], [place] [address] Toll Free Main # [phone] Fax # [phone] Cell # [phone]  Fax # [phone]  [name]

## 2016-06-18 NOTE — Telephone Encounter (Signed)
agree

## 2016-06-18 NOTE — Patient Outreach (Signed)
[facility]) Care Management  06/18/2016  Jennifer [name] 1961-11-13 [id number]  Received notification from Nat Christen, social worker with [facility] care management that patients transportation for 06/19/16 to primary MD appointment has been approved with [employer].   Received notification from Orlene Plum, care management assistant that arrangements have been made for the transportation. Mickel Baas stated she attempted to contact patient to inform her of the transportation set up but was unable to reach patient.  This RNCM contacted patient to notify her that her transportation has been arranged.  Requested patient contact [facility] at [phone] for specified pick up time for transport. Patient verbalized appreciation and understanding.  Received notification from [facility] that she spoke with patient and informed her of transportation arrangements.  PLAN:  No further follow up needed by this RNCM.  Patient has been referred to community case Freight forwarder and Education officer, museum.   Quinn Plowman RN,BSN,CCM [facility] Telephonic  [phone]

## 2016-06-19 ENCOUNTER — Encounter: Payer: Medicare Other | Admitting: Internal Medicine

## 2016-06-19 ENCOUNTER — Ambulatory Visit: Payer: Self-pay

## 2016-06-19 ENCOUNTER — Other Ambulatory Visit: Payer: Self-pay

## 2016-06-19 NOTE — Patient Outreach (Signed)
[facility]) Care Management [place] Telephone Outreach  06/19/2016  BIANCE DIERKER 1961/02/22 [id number]  Initial phone outreach to patient at home number [phone]. Educated patient regarding confidentiality and verified patient identity via DOB and address.  Discussed referral and patient agreeable to home visit next week for care coordination and COPD education.  Patient had a doctor's appointment scheduled for today wit PCP. [employer] had arranged transportation, but patient stated that the driver did not have the patient's correct phone number and could not reach her. Stated that she did not know he was her driver because there was nothing on his car that showed he provided transportation so she did not go out. Stated that she was late to her appointment and had to reschedule for next week. Patient stated that she has transportation via [employer] for next week and has the money to pay for the ride to that appointment. Advised to contact RNCM if any changes in her transportation occur prior to her appointment next week.  Plan: Home visit scheduled for next week to assess for additional needs and provide education regarding COPD and safety.  [place] R. Jameriah Trotti, RN, BSN, [place] Management Coordinator [phone]

## 2016-06-20 ENCOUNTER — Ambulatory Visit: Payer: Self-pay

## 2016-06-20 DIAGNOSIS — L97511 Non-pressure chronic ulcer of other part of right foot limited to breakdown of skin: Secondary | ICD-10-CM | POA: Diagnosis not present

## 2016-06-20 DIAGNOSIS — E11621 Type 2 diabetes mellitus with foot ulcer: Secondary | ICD-10-CM | POA: Diagnosis not present

## 2016-06-20 DIAGNOSIS — E1151 Type 2 diabetes mellitus with diabetic peripheral angiopathy without gangrene: Secondary | ICD-10-CM | POA: Diagnosis not present

## 2016-06-20 DIAGNOSIS — G894 Chronic pain syndrome: Secondary | ICD-10-CM | POA: Diagnosis not present

## 2016-06-20 DIAGNOSIS — J449 Chronic obstructive pulmonary disease, unspecified: Secondary | ICD-10-CM | POA: Diagnosis not present

## 2016-06-20 DIAGNOSIS — E1165 Type 2 diabetes mellitus with hyperglycemia: Secondary | ICD-10-CM | POA: Diagnosis not present

## 2016-06-20 DIAGNOSIS — E1139 Type 2 diabetes mellitus with other diabetic ophthalmic complication: Secondary | ICD-10-CM | POA: Diagnosis not present

## 2016-06-20 DIAGNOSIS — I159 Secondary hypertension, unspecified: Secondary | ICD-10-CM | POA: Diagnosis not present

## 2016-06-20 DIAGNOSIS — E1142 Type 2 diabetes mellitus with diabetic polyneuropathy: Secondary | ICD-10-CM | POA: Diagnosis not present

## 2016-06-20 DIAGNOSIS — H409 Unspecified glaucoma: Secondary | ICD-10-CM | POA: Diagnosis not present

## 2016-06-20 DIAGNOSIS — E11319 Type 2 diabetes mellitus with unspecified diabetic retinopathy without macular edema: Secondary | ICD-10-CM | POA: Diagnosis not present

## 2016-06-20 DIAGNOSIS — E1159 Type 2 diabetes mellitus with other circulatory complications: Secondary | ICD-10-CM | POA: Diagnosis not present

## 2016-06-20 DIAGNOSIS — I5032 Chronic diastolic (congestive) heart failure: Secondary | ICD-10-CM | POA: Diagnosis not present

## 2016-06-24 DIAGNOSIS — I159 Secondary hypertension, unspecified: Secondary | ICD-10-CM | POA: Diagnosis not present

## 2016-06-24 DIAGNOSIS — H409 Unspecified glaucoma: Secondary | ICD-10-CM | POA: Diagnosis not present

## 2016-06-24 DIAGNOSIS — J449 Chronic obstructive pulmonary disease, unspecified: Secondary | ICD-10-CM | POA: Diagnosis not present

## 2016-06-24 DIAGNOSIS — M069 Rheumatoid arthritis, unspecified: Secondary | ICD-10-CM | POA: Diagnosis not present

## 2016-06-24 DIAGNOSIS — G894 Chronic pain syndrome: Secondary | ICD-10-CM | POA: Diagnosis not present

## 2016-06-24 DIAGNOSIS — M797 Fibromyalgia: Secondary | ICD-10-CM | POA: Diagnosis not present

## 2016-06-25 ENCOUNTER — Ambulatory Visit: Payer: Medicare Other

## 2016-06-25 ENCOUNTER — Telehealth: Payer: Self-pay | Admitting: Internal Medicine

## 2016-06-25 DIAGNOSIS — M797 Fibromyalgia: Secondary | ICD-10-CM | POA: Diagnosis not present

## 2016-06-25 DIAGNOSIS — I159 Secondary hypertension, unspecified: Secondary | ICD-10-CM | POA: Diagnosis not present

## 2016-06-25 DIAGNOSIS — J449 Chronic obstructive pulmonary disease, unspecified: Secondary | ICD-10-CM | POA: Diagnosis not present

## 2016-06-25 DIAGNOSIS — G894 Chronic pain syndrome: Secondary | ICD-10-CM | POA: Diagnosis not present

## 2016-06-25 DIAGNOSIS — H409 Unspecified glaucoma: Secondary | ICD-10-CM | POA: Diagnosis not present

## 2016-06-25 DIAGNOSIS — M069 Rheumatoid arthritis, unspecified: Secondary | ICD-10-CM | POA: Diagnosis not present

## 2016-06-25 NOTE — Telephone Encounter (Signed)
Needs orders for PT

## 2016-06-25 NOTE — Telephone Encounter (Signed)
yes

## 2016-06-25 NOTE — Telephone Encounter (Signed)
VO order given for PT 02x week for 4 weeks. Do you agree?

## 2016-06-25 NOTE — Telephone Encounter (Signed)
VO given for HH 1 time per week for 3 weeks and to reorder OT. Do you agree?

## 2016-06-25 NOTE — Telephone Encounter (Signed)
Needs continuation orders for Nursing 1x week 3 week OT Reorder

## 2016-06-26 ENCOUNTER — Ambulatory Visit: Payer: Self-pay

## 2016-06-26 ENCOUNTER — Other Ambulatory Visit: Payer: Self-pay

## 2016-06-26 DIAGNOSIS — Z748 Other problems related to care provider dependency: Secondary | ICD-10-CM

## 2016-06-26 NOTE — Patient Outreach (Signed)
[facility]) Care Management  06/26/2016  JESTINA TIMMER 1960/12/25 [id number]   Notification was received from [facility] care management administrative assistance, Freda Jackson that patient called on 06/25/16  requesting transportation assistance to doctors appointment for 06/25/16.  Per Freda Jackson he was unable to receive approval for same day transportation service for patient.   Patient has been referred to community case Freight forwarder and social worker with [facility] care management.  RNCM will re- refer  Patient to social worker to assess transportation needs.  PLAN:  RNCM will refer patient to social worker for assistance with transportation needs.   Quinn Plowman RN,BSN,CCM [facility] Telephonic  [phone]

## 2016-06-27 ENCOUNTER — Other Ambulatory Visit: Payer: Self-pay

## 2016-06-27 DIAGNOSIS — H409 Unspecified glaucoma: Secondary | ICD-10-CM | POA: Diagnosis not present

## 2016-06-27 DIAGNOSIS — I159 Secondary hypertension, unspecified: Secondary | ICD-10-CM | POA: Diagnosis not present

## 2016-06-27 DIAGNOSIS — M797 Fibromyalgia: Secondary | ICD-10-CM | POA: Diagnosis not present

## 2016-06-27 DIAGNOSIS — G894 Chronic pain syndrome: Secondary | ICD-10-CM | POA: Diagnosis not present

## 2016-06-27 DIAGNOSIS — J449 Chronic obstructive pulmonary disease, unspecified: Secondary | ICD-10-CM | POA: Diagnosis not present

## 2016-06-27 DIAGNOSIS — M069 Rheumatoid arthritis, unspecified: Secondary | ICD-10-CM | POA: Diagnosis not present

## 2016-06-30 ENCOUNTER — Encounter: Payer: Self-pay | Admitting: Internal Medicine

## 2016-06-30 ENCOUNTER — Ambulatory Visit (INDEPENDENT_AMBULATORY_CARE_PROVIDER_SITE_OTHER): Payer: Medicare Other | Admitting: Internal Medicine

## 2016-06-30 VITALS — BP 150/82 | HR 88 | Temp 98.4°F | Ht 67.0 in | Wt 275.8 lb

## 2016-06-30 DIAGNOSIS — M069 Rheumatoid arthritis, unspecified: Secondary | ICD-10-CM | POA: Diagnosis not present

## 2016-06-30 DIAGNOSIS — J449 Chronic obstructive pulmonary disease, unspecified: Secondary | ICD-10-CM | POA: Diagnosis not present

## 2016-06-30 DIAGNOSIS — Z89512 Acquired absence of left leg below knee: Secondary | ICD-10-CM

## 2016-06-30 DIAGNOSIS — E13621 Other specified diabetes mellitus with foot ulcer: Secondary | ICD-10-CM

## 2016-06-30 DIAGNOSIS — E11621 Type 2 diabetes mellitus with foot ulcer: Secondary | ICD-10-CM

## 2016-06-30 DIAGNOSIS — Z794 Long term (current) use of insulin: Secondary | ICD-10-CM

## 2016-06-30 DIAGNOSIS — M797 Fibromyalgia: Secondary | ICD-10-CM | POA: Diagnosis not present

## 2016-06-30 DIAGNOSIS — G894 Chronic pain syndrome: Secondary | ICD-10-CM | POA: Diagnosis not present

## 2016-06-30 DIAGNOSIS — I152 Hypertension secondary to endocrine disorders: Secondary | ICD-10-CM | POA: Diagnosis not present

## 2016-06-30 DIAGNOSIS — L97519 Non-pressure chronic ulcer of other part of right foot with unspecified severity: Secondary | ICD-10-CM

## 2016-06-30 DIAGNOSIS — IMO0002 Reserved for concepts with insufficient information to code with codable children: Secondary | ICD-10-CM

## 2016-06-30 DIAGNOSIS — I10 Essential (primary) hypertension: Secondary | ICD-10-CM

## 2016-06-30 DIAGNOSIS — E1159 Type 2 diabetes mellitus with other circulatory complications: Secondary | ICD-10-CM

## 2016-06-30 DIAGNOSIS — Z7951 Long term (current) use of inhaled steroids: Secondary | ICD-10-CM

## 2016-06-30 DIAGNOSIS — E1151 Type 2 diabetes mellitus with diabetic peripheral angiopathy without gangrene: Secondary | ICD-10-CM

## 2016-06-30 DIAGNOSIS — J42 Unspecified chronic bronchitis: Secondary | ICD-10-CM

## 2016-06-30 DIAGNOSIS — E1165 Type 2 diabetes mellitus with hyperglycemia: Secondary | ICD-10-CM | POA: Diagnosis not present

## 2016-06-30 DIAGNOSIS — L97509 Non-pressure chronic ulcer of other part of unspecified foot with unspecified severity: Secondary | ICD-10-CM

## 2016-06-30 DIAGNOSIS — I159 Secondary hypertension, unspecified: Secondary | ICD-10-CM | POA: Diagnosis not present

## 2016-06-30 DIAGNOSIS — Z79899 Other long term (current) drug therapy: Secondary | ICD-10-CM

## 2016-06-30 DIAGNOSIS — H409 Unspecified glaucoma: Secondary | ICD-10-CM | POA: Diagnosis not present

## 2016-06-30 MED ORDER — INSULIN LISPRO PROT & LISPRO (75-25 MIX) 100 UNIT/ML KWIKPEN: PEN_INJECTOR | SUBCUTANEOUS | Status: DC

## 2016-06-30 MED ORDER — METFORMIN HCL ER 500 MG PO TB24: 500.0000 mg | ORAL_TABLET | Freq: Every day | ORAL | Status: DC

## 2016-06-30 NOTE — Progress Notes (Signed)
Call to [place] spoke with scheduler asked about previous call from [facility] Nurse about patient's toe on right foot.  Has not received message as of yet. Home Health Nurse saw toe this morning and applied a new  dressing.   Scheduled patient for follow up with [facility]ound Center to check toe on right foot .  Patient has an appointment on 07/02/2016 at 12:30 PM.  Patient is aware of the appointment.  Sander Nephew, RN 06/30/2016 3:22 PM

## 2016-06-30 NOTE — Assessment & Plan Note (Addendum)
Poorly controlled. Pt did not bring meter but reports BG ~300 over the last several weeks. Pt reports that she take insulin regularly, but has been taking Humalog 75-25 120U before breakfast and 85U in the evening most often AFTER dinner. She was Rx'd for 135U before breakfast and 70 U after breakfast. Will plan to increase to 135U before dinner and 70U BEFORE dinner. If tolerated w/o hypoglycemia, will increase evening insulin to 85U before dinner. Additionally, I feel she would benefit from an insulin sensitizer but is not agreeable to Metformin. Discussed ER Metformin which as fewer GI side effects. Pt is agreeable to trial of Metformin ER 500mg . Will Rx 1 mos.

## 2016-06-30 NOTE — Assessment & Plan Note (Addendum)
Patient's BP elevated on presentation today, but resolved by the end of the visit. Anti-hypertensive are being filled with pharmacy and pt reports good compliance. Pt notes that Medication administration was delayed today due to appt. Recommend diuresis with Lassix for LE edema x several days, will hopefully decreased BP as well. Will plan to observe for now w/o further adjustment of BP meds.

## 2016-06-30 NOTE — Patient Instructions (Signed)
Your COPD appears well controlled today. Please call us if you notice that your breathing has worsened or you are relying on your rescue inhaler or nebulizer more frequently.  Please remember to take your BP medications.  Your blood sugars have been high. We will adjust your insulin.  Take your Humalog 75-25 two times every day. 135U BEFORE breakfast, and 70U BEFORE dinner. Check your blood sugar after dinner and if you do not have low readings, you may increase your evening dose to 85U BEFORE dinner.  We will also try a low dose of Extended Release Metforming 500mg  in order to lower your blood sugars more effectively and to help protect your heart. This should hopefully reduce your GI upset. Please let us know if this is causing nausea or diarrhea.

## 2016-06-30 NOTE — Progress Notes (Signed)
CC: f/u COPD exacerbation HPI: [name] is a 55 y.o. female with a h/o of COPD, t2DM, HTN, HF who presents for f/u of recent hospitalization for viral infection and COPD exacerbation.  Pt reports that breathing is better. She has finished her Abx and is taking her daily inhalers regularly. She has not been relying on her rescue inhalers/nebs.  She notes that she has had some increased urinary frequency lately 2/2 high BG readings in the 300s. She has been taking her evening insulins AFTER dinner rather than before. She denies changes to her VA, HA, anxiety/tremors, increased thirst. She denies low BG readings recently. Taking Humalog 120U before breakfast and 85U in the evening, sometimes before dinner, sometimes after dinner.  She also notes that she has had some swelling in her LE. Taking HCTZ qD and not taking furosemide PRN b/c she threw it away after PCP changed from Lassix to HCTZ. Weight stable, LE edema unchanged, no SOB. She reports that she should be receiving new rx of Lassix with next shipments of meds for PRN use.   Past Medical History  Diagnosis Date  . Depression   . GERD (gastroesophageal reflux disease)   . Hyperlipidemia   . Hypertension   . Glaucoma   . PVD (peripheral vascular disease) with claudication ([place])     Stents to bilateral common iliac arteries (left 2005, right 2008), on chronic plavix  . Hyperplastic colon polyp 12/2010    Per colonoscopy (12/2010) - Dr. Deatra Ina  . Asthma   . COPD 01/08/2007    PFT's 05/2007 : FEV1/FVC 82, FEV1 64% pred, FEF 25-75% 40% predicted, 16% improvement in FEV1 with bronchodilators.     . Tobacco abuse   . Chronic osteomyelitis of foot (HCC)     chronic, right secondary to diabetic foot ulcers  . Polymicrobial bacterial infection 01/2013    GBS and S. aureus bacteremia // Source likely infected diabetic foot ulcer  . 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  Blood cultures 01/26/13 S. Aureus and GBS. Blood cultures Feb 6th, 8th, and 9th and March were negative.Repeat TEE 3/20 negative for vegitations  . 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 Chronic diastolic heart failure (HCC)     grade 2 per 2D echocardiogram (01/2013)  . Ulcer of foot, chronic (HCC)     Left. No OM per MRI (01/2013)  . DVT of upper extremity (deep vein thrombosis) ([place]) 03/11/2013    Secondary to PICC line. Right brachial vein, diagnosed on 03/10/2013 Coumadin for 3 months. End date 06/10/2013   . Chronic pain syndrome 12/03/2011    Likely secondary to depression, "fibromyalgia", neuropathy, and obesity. Lumbar MRI 2014 no sig change from prior (2008) : Stable hypertrophic facet disease most notable at L4-5. Stable shallow left foraminal/extraforaminal disc protrusion at L4-5. No direct neural compression.      . Environmental allergies     Hx: of  . Fatty liver 2003    observed on ultrasound abdomen  . CHF (congestive heart failure) ([place])   . Anginal pain [facility])     '3' of 10 ischemia ruled out 9/9   . Fibromyalgia   . Chronic bronchitis ([place])     "I get it alot" (09/28/2013)  . Exertional shortness of breath   . Rheumatoid arthritis ([place])   . Arthritis of lumbar spine   . Chronic lower back pain   . Diabetic  peripheral neuropathy ([place])   . Type II diabetes mellitus with peripheral circulatory disorders, uncontrolled DX: 1993    Insulin dep. Poor control. Complicated by diabetic foot ulcer and diabetic eye disease.    . 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 2/2 tobacco use, DM, and cont weight bearing on surgical wound and developed gangrene    Current Outpatient Rx  Name  Route  Sig  Dispense  Refill  . ADVAIR DISKUS 250-50 MCG/DOSE AEPB      TAKE 1 INHALATION BY MOUTH TWICE DAILY   180 each   3   . albuterol (PROVENTIL HFA;VENTOLIN HFA) 108 (90 Base) MCG/ACT inhaler    Inhalation   Inhale 2 puffs into the lungs every 6 (six) hours as needed for wheezing or shortness of breath.         Marland Kitchen albuterol (PROVENTIL) (2.5 MG/3ML) 0.083% nebulizer solution      INHALE THE CONTENTS OF 1 VIAL VIA NEBULIZER EVERY 6 HOURS AS NEEDED FOR WHEEZING   360 mL   2   . amLODipine (NORVASC) 10 MG tablet   Oral   Take 1 tablet (10 mg total) by mouth daily.   30 tablet   11     Pls cancel any 5 mg refills   . atenolol (TENORMIN) 100 MG tablet      TAKE 1 TABLET BY MOUTH ONCE DAILY   90 tablet   3   . atorvastatin (LIPITOR) 80 MG tablet   Oral   Take 1 tablet (80 mg total) by mouth daily. Patient not taking: Reported on 06/27/2016   90 tablet   3     pls stop her pravastatin.   . baclofen (LIORESAL) 10 MG tablet   Oral   Take 10 mg by mouth 3 (three) times daily. Reported on 06/27/2016         . benazepril (LOTENSIN) 40 MG tablet      TAKE 1 TABLET BY MOUTH DAILY   90 tablet   3   . diphenhydrAMINE (BENADRYL) 25 MG tablet   Oral   Take 2 tablets (50 mg total) by mouth at bedtime as needed.   30 tablet   0   . EASY TOUCH PEN NEEDLES 31G X 8 MM MISC      USE TO INJECT INSULIN TWICE DAILY   100 each   10   . fluticasone (FLONASE) 50 MCG/ACT nasal spray      PLACE 1 SPRAY INTO BOTH NOSTRILS DAILY   48 g   11   . furosemide (LASIX) 40 MG tablet   Oral   Take 1 tablet (40 mg total) by mouth daily as needed.   30 tablet   2   . hydrochlorothiazide (HYDRODIURIL) 25 MG tablet   Oral   Take 1 tablet (25 mg total) by mouth daily.   30 tablet   3     Lasix will now be PRN   . Insulin Lispro Prot & Lispro (HUMALOG MIX 75/25 KWIKPEN) (75-25) 100 UNIT/ML Kwikpen      INJECT 135 UNITS SUBCUTANEOUSLY IN THE MORNING BEFORE BREAKFAST & INJECT 85 UNITS SUBCUTANEOUSLY IN THE EVENING BEFORE EVENING MEAL   75 mL   5   . ipratropium (ATROVENT) 0.02 % nebulizer solution      USE 1 VIAL VIA NEBULIZER EVERY 6 HOURS AS NEEDED FOR WHEEZING Patient not  taking: Reported on 06/27/2016  75 mL   5   . lidocaine (LIDODERM) 5 %   Transdermal   Place 1 patch onto the skin daily. Remove & Discard patch within 12 hours or as directed by MD Patient not taking: Reported on 06/05/2016   30 patch   0   . LYRICA 200 MG capsule      TAKE 1 CAPSULE BY MOUTH THREE TIMES A DAY   90 capsule   5     Dispense as written.   . metFORMIN (GLUCOPHAGE XR) 500 MG 24 hr tablet   Oral   Take 1 tablet (500 mg total) by mouth daily with breakfast.   30 tablet   11   . nystatin (MYCOSTATIN) powder      APPLY TO AFFECTED AREA THREE TIMES A DAY   60 g   4   . ONETOUCH DELICA LANCETS 99991111 MISC      USE TO CHECK BLOOD SUGARS FOUR TIMES A DAY   100 each   3   . ONETOUCH VERIO test strip      USE 3-4 TIMES DAILY TO CHECK BLOOD SUGAR   100 each   11     E11.51   . oxyCODONE-acetaminophen (PERCOCET/ROXICET) 5-325 MG tablet   Oral   Take 1-2 tablets by mouth every 4 (four) hours as needed for severe pain.   258 tablet   0     Rx 3 of 3. Fill 30 days after last Rx   . potassium chloride SA (K-DUR,KLOR-CON) 20 MEQ tablet      TAKE 2 TABLETS BY MOUTH DAILY   180 tablet   0   . PROAIR HFA 108 (90 Base) MCG/ACT inhaler      INHALE 2 PUFFS BY MOUTH EVERY 6 HOURS AS NEEDED FOR WHEEZING Patient not taking: Reported on 06/05/2016   8.5 each   4   . ranitidine (ZANTAC) 150 MG tablet      TAKE 1 TABLET BY MOUTH TWICE DAILY   180 tablet   3   . sertraline (ZOLOFT) 100 MG tablet   Oral   Take 1 tablet (100 mg total) by mouth daily.   90 tablet   3   . travoprost, benzalkonium, (TRAVATAN) 0.004 % ophthalmic solution   Both Eyes   Place 1 drop into both eyes at bedtime.          . traZODone (DESYREL) 100 MG tablet      TAKE 1 TABLET BY MOUTH DAILY AT BEDTIME AS NEEDED FOR SLEEP   90 tablet   3    Review of Systems: A complete ROS was negative except as per HPI.  Physical Exam: Filed Vitals:   06/30/16 1455  BP: 190/82  Pulse:  88  Temp: 98.4 F (36.9 C)  TempSrc: Oral  Height: 5\' 7"  (1.702 m)  Weight: 275 lb 12.8 oz (125.102 kg)  SpO2: 92%   General appearance: alert, cooperative, appears stated age and no distress Lungs: mild inspiratory and expiratory wheezing through, no crackles, nml WOB Heart: regular rate and rhythm, S1, S2 normal, no murmur, click, rub or gallop Abdomen: soft, non-tender; bowel sounds normal; no masses,  no organomegaly Extremities: extremities normal, atraumatic, no cyanosis. Edema 2+ in R LE, L leg s/p BKA. Open wound on R great toe, non-draining, w/o exudate.  Assessment & Plan:  See encounters tab for problem based medical decision making. Patient seen with Dr. Angelia Mould  Signed: Holley Raring, MD 06/30/2016, 4:27 PM  Pager: [phone]

## 2016-06-30 NOTE — Assessment & Plan Note (Signed)
DM foot ulcer under tx at wound clinic, ruptured today. Pt referred back to wound clinic for continued care. Does not appear infected today.

## 2016-06-30 NOTE — Assessment & Plan Note (Signed)
Doing well since discharge. Finished Abx, not taking robitussen. Not requiring rescue inhaler or nebs except for occasionally. Taking daily inhalers regularly. Reports that she has not SOB. Will continue to monitor.

## 2016-06-30 NOTE — Progress Notes (Signed)
Internal Medicine Clinic Attending  I saw and evaluated the patient.  I personally confirmed the key portions of the history and exam documented by Dr. Strelow and I reviewed pertinent patient test results.  The assessment, diagnosis, and plan were formulated together and I agree with the documentation in the resident's note. 

## 2016-07-01 ENCOUNTER — Other Ambulatory Visit: Payer: Self-pay | Admitting: Internal Medicine

## 2016-07-01 DIAGNOSIS — I159 Secondary hypertension, unspecified: Secondary | ICD-10-CM | POA: Diagnosis not present

## 2016-07-01 DIAGNOSIS — M797 Fibromyalgia: Secondary | ICD-10-CM | POA: Diagnosis not present

## 2016-07-01 DIAGNOSIS — J449 Chronic obstructive pulmonary disease, unspecified: Secondary | ICD-10-CM | POA: Diagnosis not present

## 2016-07-01 DIAGNOSIS — G894 Chronic pain syndrome: Secondary | ICD-10-CM | POA: Diagnosis not present

## 2016-07-01 DIAGNOSIS — H409 Unspecified glaucoma: Secondary | ICD-10-CM | POA: Diagnosis not present

## 2016-07-01 DIAGNOSIS — M069 Rheumatoid arthritis, unspecified: Secondary | ICD-10-CM | POA: Diagnosis not present

## 2016-07-02 ENCOUNTER — Telehealth: Payer: Self-pay

## 2016-07-02 ENCOUNTER — Ambulatory Visit (HOSPITAL_COMMUNITY)
Admission: RE | Admit: 2016-07-02 | Discharge: 2016-07-02 | Disposition: A | Discharge status: Home / Self Care | Payer: Medicare Other | Source: Ambulatory Visit | Attending: Internal Medicine | Admitting: Internal Medicine

## 2016-07-02 ENCOUNTER — Encounter (HOSPITAL_BASED_OUTPATIENT_CLINIC_OR_DEPARTMENT_OTHER): Payer: Medicare Other | Attending: Internal Medicine

## 2016-07-02 ENCOUNTER — Other Ambulatory Visit: Payer: Self-pay | Admitting: Internal Medicine

## 2016-07-02 DIAGNOSIS — E11621 Type 2 diabetes mellitus with foot ulcer: Secondary | ICD-10-CM | POA: Diagnosis not present

## 2016-07-02 DIAGNOSIS — M869 Osteomyelitis, unspecified: Secondary | ICD-10-CM

## 2016-07-02 DIAGNOSIS — F17200 Nicotine dependence, unspecified, uncomplicated: Secondary | ICD-10-CM | POA: Diagnosis not present

## 2016-07-02 DIAGNOSIS — M797 Fibromyalgia: Secondary | ICD-10-CM | POA: Diagnosis not present

## 2016-07-02 DIAGNOSIS — I159 Secondary hypertension, unspecified: Secondary | ICD-10-CM | POA: Diagnosis not present

## 2016-07-02 DIAGNOSIS — S91104A Unspecified open wound of right lesser toe(s) without damage to nail, initial encounter: Secondary | ICD-10-CM | POA: Diagnosis not present

## 2016-07-02 DIAGNOSIS — I87321 Chronic venous hypertension (idiopathic) with inflammation of right lower extremity: Secondary | ICD-10-CM | POA: Diagnosis not present

## 2016-07-02 DIAGNOSIS — E1151 Type 2 diabetes mellitus with diabetic peripheral angiopathy without gangrene: Secondary | ICD-10-CM | POA: Insufficient documentation

## 2016-07-02 DIAGNOSIS — E1140 Type 2 diabetes mellitus with diabetic neuropathy, unspecified: Secondary | ICD-10-CM | POA: Insufficient documentation

## 2016-07-02 DIAGNOSIS — J449 Chronic obstructive pulmonary disease, unspecified: Secondary | ICD-10-CM | POA: Diagnosis not present

## 2016-07-02 DIAGNOSIS — M069 Rheumatoid arthritis, unspecified: Secondary | ICD-10-CM | POA: Diagnosis not present

## 2016-07-02 DIAGNOSIS — Z89512 Acquired absence of left leg below knee: Secondary | ICD-10-CM | POA: Insufficient documentation

## 2016-07-02 DIAGNOSIS — L97511 Non-pressure chronic ulcer of other part of right foot limited to breakdown of skin: Secondary | ICD-10-CM | POA: Insufficient documentation

## 2016-07-02 DIAGNOSIS — I509 Heart failure, unspecified: Secondary | ICD-10-CM | POA: Insufficient documentation

## 2016-07-02 DIAGNOSIS — H409 Unspecified glaucoma: Secondary | ICD-10-CM | POA: Diagnosis not present

## 2016-07-02 DIAGNOSIS — G894 Chronic pain syndrome: Secondary | ICD-10-CM | POA: Diagnosis not present

## 2016-07-02 NOTE — Telephone Encounter (Signed)
Needs to speak with a nurse about quit smoking. Please call pt back.

## 2016-07-03 LAB — GLUCOSE, CAPILLARY: Glucose-Capillary: 287 mg/dL — ABNORMAL HIGH (ref 65–99)

## 2016-07-03 NOTE — Telephone Encounter (Signed)
Patients states she "wants that no smoking medicine I talked about with my doctor". I did not see a specific medication mention in the last office visit note. Please advise.

## 2016-07-03 NOTE — Telephone Encounter (Signed)
Added to your schedule for 08/07/2016

## 2016-07-03 NOTE — Telephone Encounter (Signed)
I did not recently talk to her about smoking med. Might have been another MD. Has bee non wellbutrin in past so might have talked about chantix. Will need appt bx we will have to arrange to taper her zoloft and discuss side effects of chantix

## 2016-07-04 ENCOUNTER — Telehealth: Payer: Self-pay | Admitting: Dietician

## 2016-07-04 ENCOUNTER — Other Ambulatory Visit: Payer: Self-pay

## 2016-07-04 DIAGNOSIS — I159 Secondary hypertension, unspecified: Secondary | ICD-10-CM | POA: Diagnosis not present

## 2016-07-04 DIAGNOSIS — M069 Rheumatoid arthritis, unspecified: Secondary | ICD-10-CM | POA: Diagnosis not present

## 2016-07-04 DIAGNOSIS — G894 Chronic pain syndrome: Secondary | ICD-10-CM | POA: Diagnosis not present

## 2016-07-04 DIAGNOSIS — H409 Unspecified glaucoma: Secondary | ICD-10-CM | POA: Diagnosis not present

## 2016-07-04 DIAGNOSIS — M797 Fibromyalgia: Secondary | ICD-10-CM | POA: Diagnosis not present

## 2016-07-04 DIAGNOSIS — J449 Chronic obstructive pulmonary disease, unspecified: Secondary | ICD-10-CM | POA: Diagnosis not present

## 2016-07-04 NOTE — Patient Outreach (Signed)
This RNCM was successful in making contact with patient who identified herself by providing date of birth and address. Purpose of call stated by this RNCM.  Patient reports having and appointment at the [facility], where her toe was assessed as being in declining stated. Patient reports further [facility] was ordered to provided wound care three times per week.    Patent also reported having a medication added to her current medication regimen.  That medication is Metformin ER 500 mg once a day and she started taking the medication today.  Patient advised that her assigned case manger would be back next week and would be in contact with her.

## 2016-07-04 NOTE — Telephone Encounter (Signed)
Called to remind patient to take PM insulin before her meal and see if it has made a difference on her blood sugars.

## 2016-07-07 DIAGNOSIS — G894 Chronic pain syndrome: Secondary | ICD-10-CM | POA: Diagnosis not present

## 2016-07-07 DIAGNOSIS — H409 Unspecified glaucoma: Secondary | ICD-10-CM | POA: Diagnosis not present

## 2016-07-07 DIAGNOSIS — I159 Secondary hypertension, unspecified: Secondary | ICD-10-CM | POA: Diagnosis not present

## 2016-07-07 DIAGNOSIS — M069 Rheumatoid arthritis, unspecified: Secondary | ICD-10-CM | POA: Diagnosis not present

## 2016-07-07 DIAGNOSIS — J449 Chronic obstructive pulmonary disease, unspecified: Secondary | ICD-10-CM | POA: Diagnosis not present

## 2016-07-07 DIAGNOSIS — M797 Fibromyalgia: Secondary | ICD-10-CM | POA: Diagnosis not present

## 2016-07-08 ENCOUNTER — Telehealth: Payer: Self-pay | Admitting: Internal Medicine

## 2016-07-08 DIAGNOSIS — M069 Rheumatoid arthritis, unspecified: Secondary | ICD-10-CM | POA: Diagnosis not present

## 2016-07-08 DIAGNOSIS — G894 Chronic pain syndrome: Secondary | ICD-10-CM | POA: Diagnosis not present

## 2016-07-08 DIAGNOSIS — I159 Secondary hypertension, unspecified: Secondary | ICD-10-CM | POA: Diagnosis not present

## 2016-07-08 DIAGNOSIS — J449 Chronic obstructive pulmonary disease, unspecified: Secondary | ICD-10-CM | POA: Diagnosis not present

## 2016-07-08 DIAGNOSIS — H409 Unspecified glaucoma: Secondary | ICD-10-CM | POA: Diagnosis not present

## 2016-07-08 DIAGNOSIS — M797 Fibromyalgia: Secondary | ICD-10-CM | POA: Diagnosis not present

## 2016-07-08 NOTE — Telephone Encounter (Signed)
OT from [facility] requesting a VO For patient.  Please call her back.

## 2016-07-08 NOTE — Telephone Encounter (Signed)
VO for OT 2x week for 4 weeks working on upper body strength and co ordination given, do you approve?

## 2016-07-08 NOTE — Telephone Encounter (Signed)
ok 

## 2016-07-09 DIAGNOSIS — L97511 Non-pressure chronic ulcer of other part of right foot limited to breakdown of skin: Secondary | ICD-10-CM | POA: Diagnosis not present

## 2016-07-09 DIAGNOSIS — E11621 Type 2 diabetes mellitus with foot ulcer: Secondary | ICD-10-CM | POA: Diagnosis not present

## 2016-07-09 DIAGNOSIS — E1140 Type 2 diabetes mellitus with diabetic neuropathy, unspecified: Secondary | ICD-10-CM | POA: Diagnosis not present

## 2016-07-09 DIAGNOSIS — Z89512 Acquired absence of left leg below knee: Secondary | ICD-10-CM | POA: Diagnosis not present

## 2016-07-09 DIAGNOSIS — J449 Chronic obstructive pulmonary disease, unspecified: Secondary | ICD-10-CM | POA: Diagnosis not present

## 2016-07-09 DIAGNOSIS — M069 Rheumatoid arthritis, unspecified: Secondary | ICD-10-CM | POA: Diagnosis not present

## 2016-07-09 DIAGNOSIS — I509 Heart failure, unspecified: Secondary | ICD-10-CM | POA: Diagnosis not present

## 2016-07-09 DIAGNOSIS — E1151 Type 2 diabetes mellitus with diabetic peripheral angiopathy without gangrene: Secondary | ICD-10-CM | POA: Diagnosis not present

## 2016-07-09 DIAGNOSIS — I87321 Chronic venous hypertension (idiopathic) with inflammation of right lower extremity: Secondary | ICD-10-CM | POA: Diagnosis not present

## 2016-07-09 NOTE — Patient Outreach (Signed)
[facility]) Care Management  Late entry for 06/27/2016  LAROYA RENNERT 05-04-1961 [id number]  Initial home visit completed for COPD, Diabetes, history of falls/safety evaluation  Subjective: Patient denies any shortness of breath at present. Complains of chronic generalized pain, with focused pain today in her fingers on left and right hands. Rates 8/10 and describes it as sharp, stabbing and burning pain. Patient stated that she also has chronic generalized pain due to diagnosis of fibromyalgia and neuropathy and is never pain free. Stated she would like to be pain-free, but realistic goal would be 5/10. Patient reports that she has a prescription for Percocet for pain and takes 2 tablets every 4 hours unless she is sleeping to help with pain.  Objective: Patient alert and oriented with no signs of distress noted. Respirations even and unlabored.   Assessment: Patient lives alone, but has family stay with her occasionally. Patient has left below knee amputation, but has a prosthetic and uses it when leaving home. Is able to ambulate in home with prosthetic and walker. However, patient relies on wheelchair for most mobility. Patient has stairs to enter apartment with railing, stated that she does use prosthetic and walker when leaving home. Reports it is difficult to do, so she does not get out often unless it is for appointments. Stated that her family has tried to encourage her to get out more, but she feels too limited.  Patient is smoking in the home today. RNCM provided education regarding smoking and COPD and patient verbalized understanding. Patient stated she is aware of the risks, but is currently not interested in smoking cessation.  Patient has transportation to appointments at times, but it is limited. Stated that she had used a transportation provider through [facility], but she was not aware he was waiting outside for her for over 30 minutes without contacting her to let her  know he was there, so she was late to her appointment and had to reschedule. Patient stated that she has transportation for future appointments.   Plan: Educated patient regarding 24 hour nurse line and patient verbalized understanding. Provided education regarding COPD. Visit was cut short due to home health therapy arriving. Patient agreeable to follow up visit in 2 weeks. Will continue to address smoking related to COPD and diabetes, and provide COPD and diabetes education.   [place] R. Masayuki Sakai, RN, BSN, [place] Management Coordinator [phone]

## 2016-07-10 ENCOUNTER — Other Ambulatory Visit: Payer: Self-pay

## 2016-07-11 DIAGNOSIS — G894 Chronic pain syndrome: Secondary | ICD-10-CM | POA: Diagnosis not present

## 2016-07-11 DIAGNOSIS — M069 Rheumatoid arthritis, unspecified: Secondary | ICD-10-CM | POA: Diagnosis not present

## 2016-07-11 DIAGNOSIS — J449 Chronic obstructive pulmonary disease, unspecified: Secondary | ICD-10-CM | POA: Diagnosis not present

## 2016-07-11 DIAGNOSIS — M797 Fibromyalgia: Secondary | ICD-10-CM | POA: Diagnosis not present

## 2016-07-11 DIAGNOSIS — H409 Unspecified glaucoma: Secondary | ICD-10-CM | POA: Diagnosis not present

## 2016-07-11 DIAGNOSIS — I159 Secondary hypertension, unspecified: Secondary | ICD-10-CM | POA: Diagnosis not present

## 2016-07-11 NOTE — Patient Outreach (Signed)
[facility]) Care Management  07/10/2016  JARETHZY FITTIPALDI 1961/08/12 [id number]   Subjective: Home visit completed with patient. Patient reported that she did not sleep well last night so is very tired today. Denies any shortness of breath. Stated that she has had no changes in her breathing in the past 2 weeks. She reports that she is only using her nebulizer or rescue inhaler 1-2 times per week as opposed to 1-2 times per day when she was sick. Patient stated that she has a small wound, "smaller than dime-size" on her right little toe that has opened up. Reports that she is going to the wound care center weekly on Wednesdays for wound care treatments and also has home health nursing with [facility] for wound care twice a week (on Mondays and Fridays). Patient reports that she occasionally changes dressings if the dressing becomes soiled or wet. She stated that she has been provided plenty of supplies if needed. Patient reported that she had transportation last week via [employer] and plans to use them again next week for wound care visit.  Patient reports that her fasting blood sugar this morning was 99 and reported that is the best it has been. She declined to go get her glucometer out of her bedroom because she was tired. Patient reports she takes her insulin as directed. Stated that she is not following a special diet. Patient reports that she used to be in the heart failure clinic, but was dismissed because she did not weigh herself daily. Stated that she would be interested in being in heart failure program again. Still does not weigh daily. Reported that she has a set of working scales, but increased pain often prevents her from using prosthetic so that she can get up to weigh. Patient reports that she always has swelling in her right lower extremity.  Patient complains of pain today in her lower back of 8/10. Last took her hydrocodone at 11 am. Stated that she takes 2 tablets every 4  hours and it usually "eases" pain, but does not eliminate pain. Patient reports she has generalized pain due to fibromyalgia and intermittent pain in her hands to rheumatoid arthritis. Pain is chronic, but is often in varying sites due to chronic pain conditions.   Objective: BP 140/72   Pulse 70 R 20, even and unlabored SpO2 ranges from 88 to 90; patient smoking during visit but no respiratory distress noted; lung sounds clear with no wheezing noted Patient has dressing to right lower extremity. Has saline and wound cleanser to clean wound when dressing is soiled or wet at home, then applies Promogran Ag and gauze to wound.   Assessment: Per review of records, patient has reached out to her primary care provider about smoking cessation. Reassessed patient's desire to quit smoking and she confirmed that she wants to quit. Educated patient about health risks of smoking, including her diabetes and wound care and patient verbalized understanding. Patient wants to heal wound to right foot and reports that she does not want another amputation. Patient has an appointment on August 17 with Dr. [name] to discuss smoking cessation. Stated that was the earliest she could get an appointment. Patient reported that she was told she would have to be weaned off one of her medications before she could start one of the smoking cessation medications.  Patient has had her nebulizer for over 5 years and reports that she needs a new one. Stated that the one she has still works,  but is old. Patient stated she received her nebulizer from the hospital and it has never been serviced and the filters have never been changed.  Patient reports that would like to be more active and exercise at the [facility]. Stated that she is interested in the water aerobics, but she cannot afford the cover for her prosthetic at this time.   Plan: Continue support and education for smoking cessation.  Continue diabetes, heart failure and COPD education.  Patient's depression is possible barrier to maintaining full compliance with healthy behaviors. Patient reports wanting to do more, but feels limited due to mobility concerns with prosthetic and chronic pain.  Follow up with PCP regarding an order for a new nebulizer. Follow up with possible resources to assist patient with getting cover for prosthetic so she could use [employer] pool for exercise.   [place] R. Demita Tobia, RN, BSN, [place] Management Coordinator [phone]

## 2016-07-14 DIAGNOSIS — J449 Chronic obstructive pulmonary disease, unspecified: Secondary | ICD-10-CM | POA: Diagnosis not present

## 2016-07-14 DIAGNOSIS — M797 Fibromyalgia: Secondary | ICD-10-CM | POA: Diagnosis not present

## 2016-07-14 DIAGNOSIS — G894 Chronic pain syndrome: Secondary | ICD-10-CM | POA: Diagnosis not present

## 2016-07-14 DIAGNOSIS — M069 Rheumatoid arthritis, unspecified: Secondary | ICD-10-CM | POA: Diagnosis not present

## 2016-07-14 DIAGNOSIS — H409 Unspecified glaucoma: Secondary | ICD-10-CM | POA: Diagnosis not present

## 2016-07-14 DIAGNOSIS — I159 Secondary hypertension, unspecified: Secondary | ICD-10-CM | POA: Diagnosis not present

## 2016-07-15 DIAGNOSIS — M069 Rheumatoid arthritis, unspecified: Secondary | ICD-10-CM | POA: Diagnosis not present

## 2016-07-15 DIAGNOSIS — H409 Unspecified glaucoma: Secondary | ICD-10-CM | POA: Diagnosis not present

## 2016-07-15 DIAGNOSIS — J449 Chronic obstructive pulmonary disease, unspecified: Secondary | ICD-10-CM | POA: Diagnosis not present

## 2016-07-15 DIAGNOSIS — I159 Secondary hypertension, unspecified: Secondary | ICD-10-CM | POA: Diagnosis not present

## 2016-07-15 DIAGNOSIS — G894 Chronic pain syndrome: Secondary | ICD-10-CM | POA: Diagnosis not present

## 2016-07-15 DIAGNOSIS — M797 Fibromyalgia: Secondary | ICD-10-CM | POA: Diagnosis not present

## 2016-07-16 DIAGNOSIS — E1151 Type 2 diabetes mellitus with diabetic peripheral angiopathy without gangrene: Secondary | ICD-10-CM | POA: Diagnosis not present

## 2016-07-16 DIAGNOSIS — I87321 Chronic venous hypertension (idiopathic) with inflammation of right lower extremity: Secondary | ICD-10-CM | POA: Diagnosis not present

## 2016-07-16 DIAGNOSIS — E1140 Type 2 diabetes mellitus with diabetic neuropathy, unspecified: Secondary | ICD-10-CM | POA: Diagnosis not present

## 2016-07-16 DIAGNOSIS — J449 Chronic obstructive pulmonary disease, unspecified: Secondary | ICD-10-CM | POA: Diagnosis not present

## 2016-07-16 DIAGNOSIS — L97511 Non-pressure chronic ulcer of other part of right foot limited to breakdown of skin: Secondary | ICD-10-CM | POA: Diagnosis not present

## 2016-07-16 DIAGNOSIS — Z89512 Acquired absence of left leg below knee: Secondary | ICD-10-CM | POA: Diagnosis not present

## 2016-07-16 DIAGNOSIS — L97521 Non-pressure chronic ulcer of other part of left foot limited to breakdown of skin: Secondary | ICD-10-CM | POA: Diagnosis not present

## 2016-07-16 DIAGNOSIS — M069 Rheumatoid arthritis, unspecified: Secondary | ICD-10-CM | POA: Diagnosis not present

## 2016-07-16 DIAGNOSIS — I509 Heart failure, unspecified: Secondary | ICD-10-CM | POA: Diagnosis not present

## 2016-07-16 DIAGNOSIS — E11621 Type 2 diabetes mellitus with foot ulcer: Secondary | ICD-10-CM | POA: Diagnosis not present

## 2016-07-17 DIAGNOSIS — I159 Secondary hypertension, unspecified: Secondary | ICD-10-CM | POA: Diagnosis not present

## 2016-07-17 DIAGNOSIS — J449 Chronic obstructive pulmonary disease, unspecified: Secondary | ICD-10-CM | POA: Diagnosis not present

## 2016-07-17 DIAGNOSIS — G894 Chronic pain syndrome: Secondary | ICD-10-CM | POA: Diagnosis not present

## 2016-07-17 DIAGNOSIS — M797 Fibromyalgia: Secondary | ICD-10-CM | POA: Diagnosis not present

## 2016-07-17 DIAGNOSIS — H409 Unspecified glaucoma: Secondary | ICD-10-CM | POA: Diagnosis not present

## 2016-07-17 DIAGNOSIS — M069 Rheumatoid arthritis, unspecified: Secondary | ICD-10-CM | POA: Diagnosis not present

## 2016-07-17 LAB — HM DIABETES EYE EXAM

## 2016-07-18 ENCOUNTER — Other Ambulatory Visit: Payer: Self-pay

## 2016-07-18 DIAGNOSIS — H409 Unspecified glaucoma: Secondary | ICD-10-CM | POA: Diagnosis not present

## 2016-07-18 DIAGNOSIS — M797 Fibromyalgia: Secondary | ICD-10-CM | POA: Diagnosis not present

## 2016-07-18 DIAGNOSIS — J449 Chronic obstructive pulmonary disease, unspecified: Secondary | ICD-10-CM | POA: Diagnosis not present

## 2016-07-18 DIAGNOSIS — M069 Rheumatoid arthritis, unspecified: Secondary | ICD-10-CM | POA: Diagnosis not present

## 2016-07-18 DIAGNOSIS — I159 Secondary hypertension, unspecified: Secondary | ICD-10-CM | POA: Diagnosis not present

## 2016-07-18 DIAGNOSIS — G894 Chronic pain syndrome: Secondary | ICD-10-CM | POA: Diagnosis not present

## 2016-07-18 NOTE — Patient Outreach (Signed)
[facility]) Care Management   07/18/16  Jennifer [name] 09-06-1961  [id number]  Attempted outreach to patient without success. Left HIPAA compliant voicemail with RNCM contact information. Invited callback.  [place] R. Ravina Milner, RN, BSN, [place] Management Coordinator [phone]

## 2016-07-19 DIAGNOSIS — M069 Rheumatoid arthritis, unspecified: Secondary | ICD-10-CM | POA: Diagnosis not present

## 2016-07-19 DIAGNOSIS — M797 Fibromyalgia: Secondary | ICD-10-CM | POA: Diagnosis not present

## 2016-07-19 DIAGNOSIS — G894 Chronic pain syndrome: Secondary | ICD-10-CM | POA: Diagnosis not present

## 2016-07-19 DIAGNOSIS — H409 Unspecified glaucoma: Secondary | ICD-10-CM | POA: Diagnosis not present

## 2016-07-19 DIAGNOSIS — I159 Secondary hypertension, unspecified: Secondary | ICD-10-CM | POA: Diagnosis not present

## 2016-07-19 DIAGNOSIS — J449 Chronic obstructive pulmonary disease, unspecified: Secondary | ICD-10-CM | POA: Diagnosis not present

## 2016-07-21 ENCOUNTER — Other Ambulatory Visit: Payer: Self-pay | Admitting: *Deleted

## 2016-07-21 DIAGNOSIS — E1159 Type 2 diabetes mellitus with other circulatory complications: Secondary | ICD-10-CM

## 2016-07-21 DIAGNOSIS — E785 Hyperlipidemia, unspecified: Secondary | ICD-10-CM

## 2016-07-21 DIAGNOSIS — J449 Chronic obstructive pulmonary disease, unspecified: Secondary | ICD-10-CM | POA: Diagnosis not present

## 2016-07-21 DIAGNOSIS — M069 Rheumatoid arthritis, unspecified: Secondary | ICD-10-CM | POA: Diagnosis not present

## 2016-07-21 DIAGNOSIS — I159 Secondary hypertension, unspecified: Secondary | ICD-10-CM | POA: Diagnosis not present

## 2016-07-21 DIAGNOSIS — I10 Essential (primary) hypertension: Principal | ICD-10-CM

## 2016-07-21 DIAGNOSIS — H409 Unspecified glaucoma: Secondary | ICD-10-CM | POA: Diagnosis not present

## 2016-07-21 DIAGNOSIS — G894 Chronic pain syndrome: Secondary | ICD-10-CM | POA: Diagnosis not present

## 2016-07-21 DIAGNOSIS — M797 Fibromyalgia: Secondary | ICD-10-CM | POA: Diagnosis not present

## 2016-07-21 NOTE — Telephone Encounter (Addendum)
Left two messages. Jennifer [name] returned the phone call:  She says her blood sugars are not good. She was checking her blood before breakfast and dinner, but now cannot find her meter. We agreed that I would leave one up front and she would check for the n ext week before meals and bedtime and bring her meter with her to an appointment with me on Aug 10th.

## 2016-07-21 NOTE — Telephone Encounter (Signed)
Received faxed refill request from [facility] for the following medications: atenolol, baclofen, hctz, trazodone, furosemide, ranitidine, and rosuvastatin (per med list-she is currently taking atorvastatin and NOT rosuvastatin.  Pt states that for these particular meds, she is able to get 9mth supply with $0 copay.  This will be her 1st time using this pharmacy, and she will continue to get some of her other meds at her current pharmacy.  Pt has also expressed an interest in obtaining a power chair and paperwork regarding this request will be sent to our office.  Will forward refill request to pcp for review, please advise.Despina Hidden Cassady7/31/20172:44 PM

## 2016-07-22 DIAGNOSIS — J449 Chronic obstructive pulmonary disease, unspecified: Secondary | ICD-10-CM | POA: Diagnosis not present

## 2016-07-22 DIAGNOSIS — M069 Rheumatoid arthritis, unspecified: Secondary | ICD-10-CM | POA: Diagnosis not present

## 2016-07-22 DIAGNOSIS — H409 Unspecified glaucoma: Secondary | ICD-10-CM | POA: Diagnosis not present

## 2016-07-22 DIAGNOSIS — I159 Secondary hypertension, unspecified: Secondary | ICD-10-CM | POA: Diagnosis not present

## 2016-07-22 DIAGNOSIS — G894 Chronic pain syndrome: Secondary | ICD-10-CM | POA: Diagnosis not present

## 2016-07-22 DIAGNOSIS — M797 Fibromyalgia: Secondary | ICD-10-CM | POA: Diagnosis not present

## 2016-07-23 ENCOUNTER — Encounter (HOSPITAL_BASED_OUTPATIENT_CLINIC_OR_DEPARTMENT_OTHER): Payer: Medicare Other | Attending: Surgery

## 2016-07-23 DIAGNOSIS — E1140 Type 2 diabetes mellitus with diabetic neuropathy, unspecified: Secondary | ICD-10-CM | POA: Diagnosis not present

## 2016-07-23 DIAGNOSIS — E1151 Type 2 diabetes mellitus with diabetic peripheral angiopathy without gangrene: Secondary | ICD-10-CM | POA: Insufficient documentation

## 2016-07-23 DIAGNOSIS — G40909 Epilepsy, unspecified, not intractable, without status epilepticus: Secondary | ICD-10-CM | POA: Diagnosis not present

## 2016-07-23 DIAGNOSIS — G894 Chronic pain syndrome: Secondary | ICD-10-CM | POA: Diagnosis not present

## 2016-07-23 DIAGNOSIS — F17210 Nicotine dependence, cigarettes, uncomplicated: Secondary | ICD-10-CM | POA: Diagnosis not present

## 2016-07-23 DIAGNOSIS — J449 Chronic obstructive pulmonary disease, unspecified: Secondary | ICD-10-CM | POA: Insufficient documentation

## 2016-07-23 DIAGNOSIS — M797 Fibromyalgia: Secondary | ICD-10-CM | POA: Diagnosis not present

## 2016-07-23 DIAGNOSIS — H409 Unspecified glaucoma: Secondary | ICD-10-CM | POA: Insufficient documentation

## 2016-07-23 DIAGNOSIS — I890 Lymphedema, not elsewhere classified: Secondary | ICD-10-CM | POA: Insufficient documentation

## 2016-07-23 DIAGNOSIS — M069 Rheumatoid arthritis, unspecified: Secondary | ICD-10-CM | POA: Diagnosis not present

## 2016-07-23 DIAGNOSIS — L97512 Non-pressure chronic ulcer of other part of right foot with fat layer exposed: Secondary | ICD-10-CM | POA: Diagnosis not present

## 2016-07-23 DIAGNOSIS — L97521 Non-pressure chronic ulcer of other part of left foot limited to breakdown of skin: Secondary | ICD-10-CM | POA: Diagnosis not present

## 2016-07-23 DIAGNOSIS — I159 Secondary hypertension, unspecified: Secondary | ICD-10-CM | POA: Diagnosis not present

## 2016-07-23 DIAGNOSIS — I87321 Chronic venous hypertension (idiopathic) with inflammation of right lower extremity: Secondary | ICD-10-CM | POA: Diagnosis not present

## 2016-07-23 DIAGNOSIS — E11621 Type 2 diabetes mellitus with foot ulcer: Secondary | ICD-10-CM | POA: Diagnosis not present

## 2016-07-23 DIAGNOSIS — I509 Heart failure, unspecified: Secondary | ICD-10-CM | POA: Insufficient documentation

## 2016-07-23 MED ORDER — TRAZODONE HCL 100 MG PO TABS: ORAL_TABLET | ORAL | 3 refills | Status: DC

## 2016-07-23 MED ORDER — FUROSEMIDE 40 MG PO TABS: 40.0000 mg | ORAL_TABLET | Freq: Every day | ORAL | 2 refills | Status: DC | PRN

## 2016-07-23 MED ORDER — BACLOFEN 10 MG PO TABS: 10.0000 mg | ORAL_TABLET | Freq: Three times a day (TID) | ORAL | 4 refills | Status: DC | PRN

## 2016-07-23 MED ORDER — RANITIDINE HCL 150 MG PO TABS: 150.0000 mg | ORAL_TABLET | Freq: Two times a day (BID) | ORAL | 3 refills | Status: DC

## 2016-07-23 MED ORDER — ATENOLOL 100 MG PO TABS: 100.0000 mg | ORAL_TABLET | Freq: Every day | ORAL | 3 refills | Status: DC

## 2016-07-23 MED ORDER — HYDROCHLOROTHIAZIDE 25 MG PO TABS: 25.0000 mg | ORAL_TABLET | Freq: Every day | ORAL | 3 refills | Status: DC

## 2016-07-23 MED ORDER — ATORVASTATIN CALCIUM 80 MG PO TABS: 80.0000 mg | ORAL_TABLET | Freq: Every day | ORAL | 3 refills | Status: DC

## 2016-07-24 ENCOUNTER — Ambulatory Visit: Payer: Self-pay

## 2016-07-24 DIAGNOSIS — G894 Chronic pain syndrome: Secondary | ICD-10-CM | POA: Diagnosis not present

## 2016-07-24 DIAGNOSIS — H409 Unspecified glaucoma: Secondary | ICD-10-CM | POA: Diagnosis not present

## 2016-07-24 DIAGNOSIS — M797 Fibromyalgia: Secondary | ICD-10-CM | POA: Diagnosis not present

## 2016-07-24 DIAGNOSIS — I159 Secondary hypertension, unspecified: Secondary | ICD-10-CM | POA: Diagnosis not present

## 2016-07-24 DIAGNOSIS — M069 Rheumatoid arthritis, unspecified: Secondary | ICD-10-CM | POA: Diagnosis not present

## 2016-07-24 DIAGNOSIS — J449 Chronic obstructive pulmonary disease, unspecified: Secondary | ICD-10-CM | POA: Diagnosis not present

## 2016-07-25 ENCOUNTER — Other Ambulatory Visit: Payer: Self-pay

## 2016-07-25 DIAGNOSIS — M069 Rheumatoid arthritis, unspecified: Secondary | ICD-10-CM | POA: Diagnosis not present

## 2016-07-25 DIAGNOSIS — M797 Fibromyalgia: Secondary | ICD-10-CM | POA: Diagnosis not present

## 2016-07-25 DIAGNOSIS — J449 Chronic obstructive pulmonary disease, unspecified: Secondary | ICD-10-CM | POA: Diagnosis not present

## 2016-07-25 DIAGNOSIS — I159 Secondary hypertension, unspecified: Secondary | ICD-10-CM | POA: Diagnosis not present

## 2016-07-25 DIAGNOSIS — G894 Chronic pain syndrome: Secondary | ICD-10-CM | POA: Diagnosis not present

## 2016-07-25 DIAGNOSIS — H409 Unspecified glaucoma: Secondary | ICD-10-CM | POA: Diagnosis not present

## 2016-07-26 NOTE — Patient Outreach (Signed)
[facility]) Care Management  07/25/2016  Jennifer [name] Dec 09, 1961 [id number]  Successful outreach completed with patient. Patient identification completed.  Patient reported that she had trouble with transportation this week when she went to the wound care center. Stated that the transportation company took a long time to come back to pick her up. Stated that there was some confusion about whether or not her transportation voucher was covered. RNCM discussed with patient that [employer] is a short-term transportation option and it will be necessary establish more long term plans for transportation. Patient stated that she is already established with [employer] and can use them earlier in the month after she gets paid, but will not be able to use them later in the month because she does not have the $3 to pay for the trips. Advised will reach out to social worker to discuss transportation.  Patient reported that at her last appointment at the wound care center that she now has blisters on the front of her leg and the wound care clinic wrapped her whole leg up to the knee. She reports that this dressing is to remain on until her follow up appointment next week. She stated that when her home health nurse comes, she can change the dressing to her toe wound, but will not be changing the dressing to the remainder of the leg. Patient stated that she had lost her glucometer and that her diabetes dietician had set her one aside, but she has not picked it up yet. Stated that she has since found her glucometer but has not checked her blood sugar today.  She stated that when her physical therapy comes out, her oxygen level has been running low, and takes a while to get it back up to 90. Stated that she feels fine though with no shortness of breath.  Patient is complaining of a new pain to the inside of the bottom of her foot. Patient rates it as a 10/10, pulsating pain. She stated that it does not hurt to  touch her foot, but that the pain is internal. She stated that her pain medication does not touch the pain. She reports that her pain medication is mainly for her neuropathy and fibromyalgia and does little to help for the osteoarthritis pain in her spine. Patient stated that she plans to address this with the wound care center at her next visit. Patient is agreeable for follow up in next couple of weeks. Will follow up regarding pain, blood sugars and continue to assess for care coordination needs. Patient advised to call with any questions or concerns and reminded of 24 hour nurse line.   [name], RN, BSN, [place] Management Coordinator [phone]

## 2016-07-28 DIAGNOSIS — M069 Rheumatoid arthritis, unspecified: Secondary | ICD-10-CM | POA: Diagnosis not present

## 2016-07-28 DIAGNOSIS — M797 Fibromyalgia: Secondary | ICD-10-CM | POA: Diagnosis not present

## 2016-07-28 DIAGNOSIS — I159 Secondary hypertension, unspecified: Secondary | ICD-10-CM | POA: Diagnosis not present

## 2016-07-28 DIAGNOSIS — G894 Chronic pain syndrome: Secondary | ICD-10-CM | POA: Diagnosis not present

## 2016-07-28 DIAGNOSIS — J449 Chronic obstructive pulmonary disease, unspecified: Secondary | ICD-10-CM | POA: Diagnosis not present

## 2016-07-28 DIAGNOSIS — H409 Unspecified glaucoma: Secondary | ICD-10-CM | POA: Diagnosis not present

## 2016-07-29 NOTE — Progress Notes (Signed)
Late entry for missed G-code. Based on review of the evaluation and goals by Mee Hives, PT.   06/07/2016 1003  PT G-Codes **NOT FOR INPATIENT CLASS**  Functional Assessment Tool Used Clinical judgement based on review of medical record  Functional Limitation Mobility: Walking and moving around  Mobility: Walking and Moving Around Current Status [phone]) CI  Mobility: Walking and Moving Around Goal Status [phone]) [place], [place]  [phone] 07/29/2016

## 2016-07-30 ENCOUNTER — Other Ambulatory Visit: Payer: Self-pay | Admitting: Internal Medicine

## 2016-07-30 DIAGNOSIS — G894 Chronic pain syndrome: Secondary | ICD-10-CM | POA: Diagnosis not present

## 2016-07-30 DIAGNOSIS — I509 Heart failure, unspecified: Secondary | ICD-10-CM | POA: Diagnosis not present

## 2016-07-30 DIAGNOSIS — L97512 Non-pressure chronic ulcer of other part of right foot with fat layer exposed: Secondary | ICD-10-CM | POA: Diagnosis not present

## 2016-07-30 DIAGNOSIS — H409 Unspecified glaucoma: Secondary | ICD-10-CM | POA: Diagnosis not present

## 2016-07-30 DIAGNOSIS — M069 Rheumatoid arthritis, unspecified: Secondary | ICD-10-CM | POA: Diagnosis not present

## 2016-07-30 DIAGNOSIS — I87321 Chronic venous hypertension (idiopathic) with inflammation of right lower extremity: Secondary | ICD-10-CM | POA: Diagnosis not present

## 2016-07-30 DIAGNOSIS — I159 Secondary hypertension, unspecified: Secondary | ICD-10-CM | POA: Diagnosis not present

## 2016-07-30 DIAGNOSIS — E11621 Type 2 diabetes mellitus with foot ulcer: Secondary | ICD-10-CM | POA: Diagnosis not present

## 2016-07-30 DIAGNOSIS — I890 Lymphedema, not elsewhere classified: Secondary | ICD-10-CM | POA: Diagnosis not present

## 2016-07-30 DIAGNOSIS — E1151 Type 2 diabetes mellitus with diabetic peripheral angiopathy without gangrene: Secondary | ICD-10-CM | POA: Diagnosis not present

## 2016-07-30 DIAGNOSIS — M797 Fibromyalgia: Secondary | ICD-10-CM | POA: Diagnosis not present

## 2016-07-30 DIAGNOSIS — E1140 Type 2 diabetes mellitus with diabetic neuropathy, unspecified: Secondary | ICD-10-CM | POA: Diagnosis not present

## 2016-07-30 DIAGNOSIS — J449 Chronic obstructive pulmonary disease, unspecified: Secondary | ICD-10-CM | POA: Diagnosis not present

## 2016-07-31 ENCOUNTER — Ambulatory Visit: Payer: Medicare Other | Admitting: Dietician

## 2016-07-31 ENCOUNTER — Telehealth: Payer: Self-pay | Admitting: Dietician

## 2016-07-31 DIAGNOSIS — J449 Chronic obstructive pulmonary disease, unspecified: Secondary | ICD-10-CM | POA: Diagnosis not present

## 2016-07-31 DIAGNOSIS — H409 Unspecified glaucoma: Secondary | ICD-10-CM | POA: Diagnosis not present

## 2016-07-31 DIAGNOSIS — I159 Secondary hypertension, unspecified: Secondary | ICD-10-CM | POA: Diagnosis not present

## 2016-07-31 DIAGNOSIS — G894 Chronic pain syndrome: Secondary | ICD-10-CM | POA: Diagnosis not present

## 2016-07-31 DIAGNOSIS — M069 Rheumatoid arthritis, unspecified: Secondary | ICD-10-CM | POA: Diagnosis not present

## 2016-07-31 DIAGNOSIS — M797 Fibromyalgia: Secondary | ICD-10-CM | POA: Diagnosis not present

## 2016-07-31 NOTE — Telephone Encounter (Signed)
Lyrica rx called to [facility]. 

## 2016-07-31 NOTE — Telephone Encounter (Signed)
Trazodone just filled. If wound needing abx cream = needs appt

## 2016-07-31 NOTE — Telephone Encounter (Signed)
Patient called and reschedule her visit from today to August 21, 2016

## 2016-08-01 DIAGNOSIS — H409 Unspecified glaucoma: Secondary | ICD-10-CM | POA: Diagnosis not present

## 2016-08-01 DIAGNOSIS — G894 Chronic pain syndrome: Secondary | ICD-10-CM | POA: Diagnosis not present

## 2016-08-01 DIAGNOSIS — M797 Fibromyalgia: Secondary | ICD-10-CM | POA: Diagnosis not present

## 2016-08-01 DIAGNOSIS — M069 Rheumatoid arthritis, unspecified: Secondary | ICD-10-CM | POA: Diagnosis not present

## 2016-08-01 DIAGNOSIS — J449 Chronic obstructive pulmonary disease, unspecified: Secondary | ICD-10-CM | POA: Diagnosis not present

## 2016-08-01 DIAGNOSIS — I159 Secondary hypertension, unspecified: Secondary | ICD-10-CM | POA: Diagnosis not present

## 2016-08-04 DIAGNOSIS — I159 Secondary hypertension, unspecified: Secondary | ICD-10-CM | POA: Diagnosis not present

## 2016-08-04 DIAGNOSIS — M069 Rheumatoid arthritis, unspecified: Secondary | ICD-10-CM | POA: Diagnosis not present

## 2016-08-04 DIAGNOSIS — G894 Chronic pain syndrome: Secondary | ICD-10-CM | POA: Diagnosis not present

## 2016-08-04 DIAGNOSIS — H409 Unspecified glaucoma: Secondary | ICD-10-CM | POA: Diagnosis not present

## 2016-08-04 DIAGNOSIS — M797 Fibromyalgia: Secondary | ICD-10-CM | POA: Diagnosis not present

## 2016-08-04 DIAGNOSIS — J449 Chronic obstructive pulmonary disease, unspecified: Secondary | ICD-10-CM | POA: Diagnosis not present

## 2016-08-05 ENCOUNTER — Other Ambulatory Visit: Payer: Self-pay

## 2016-08-05 NOTE — Patient Outreach (Signed)
[facility]) Care Management  08/05/16  Jennifer [name] [birth year] [id number]  Received voicemail from patient on 08/04/2016 at 4:16 pm stating that she has some appointments this week and needs transportation. Per voicemail, she is requesting [employer] because she does not have any money to use [employer] at present. Patient stated that she has an appointment on Wednesday 8/16 with the wound care center and that she has an appointment with her primary care provider on Thursday 8/15.   RNCM discussed case with Nat Christen LCSW regarding transportation needs. Per Di Kindle, she has already discussed that [employer] is no longer approved for the patient. She stated that the patient can utilize [employer], [employer], [employer], as well as her father and son for transportation needs.   RNCM attempted to return call to patient today without success. Phone went straight to voicemail, however, RNCM unable to leave a message due to mailbox being full.   Jennifer R. Quintez Maselli, RN, BSN, [place] Management Coordinator [phone]

## 2016-08-06 ENCOUNTER — Other Ambulatory Visit: Payer: Self-pay

## 2016-08-06 DIAGNOSIS — E1151 Type 2 diabetes mellitus with diabetic peripheral angiopathy without gangrene: Secondary | ICD-10-CM | POA: Diagnosis not present

## 2016-08-06 DIAGNOSIS — E11621 Type 2 diabetes mellitus with foot ulcer: Secondary | ICD-10-CM | POA: Diagnosis not present

## 2016-08-06 DIAGNOSIS — I87321 Chronic venous hypertension (idiopathic) with inflammation of right lower extremity: Secondary | ICD-10-CM | POA: Diagnosis not present

## 2016-08-06 DIAGNOSIS — J449 Chronic obstructive pulmonary disease, unspecified: Secondary | ICD-10-CM | POA: Diagnosis not present

## 2016-08-06 DIAGNOSIS — H409 Unspecified glaucoma: Secondary | ICD-10-CM | POA: Diagnosis not present

## 2016-08-06 DIAGNOSIS — L97512 Non-pressure chronic ulcer of other part of right foot with fat layer exposed: Secondary | ICD-10-CM | POA: Diagnosis not present

## 2016-08-06 DIAGNOSIS — E1140 Type 2 diabetes mellitus with diabetic neuropathy, unspecified: Secondary | ICD-10-CM | POA: Diagnosis not present

## 2016-08-06 DIAGNOSIS — I509 Heart failure, unspecified: Secondary | ICD-10-CM | POA: Diagnosis not present

## 2016-08-06 DIAGNOSIS — I890 Lymphedema, not elsewhere classified: Secondary | ICD-10-CM | POA: Diagnosis not present

## 2016-08-06 DIAGNOSIS — M069 Rheumatoid arthritis, unspecified: Secondary | ICD-10-CM | POA: Diagnosis not present

## 2016-08-06 NOTE — Patient Outreach (Signed)
Jennifer [name] [facility]) Care Management  Jennifer [name]  08/05/2016   Jennifer [name] March 30, 1961 [id number]  Subjective: Home visit completed with patient. When RNCM arrived, patient was in the bed asleep. Her son, Jennifer [name] was present and stated that she has been trouble sleeping at night and that she had been up all night and he made her go to bed sometime early this morning.   Objective: BP 130/60 Pulse 68 Resp 16 SpO2 91%  Lung sounds clear to auscultation  Encounter Medications:  Outpatient Encounter Prescriptions as of 08/05/2016  Medication Sig Note  . ADVAIR DISKUS 250-50 MCG/DOSE AEPB TAKE 1 INHALATION BY MOUTH TWICE DAILY   . albuterol (PROVENTIL HFA;VENTOLIN HFA) 108 (90 Base) MCG/ACT inhaler Inhale 2 puffs into the lungs every 6 (six) hours as needed for wheezing or shortness of breath.   Marland Kitchen albuterol (PROVENTIL) (2.5 MG/3ML) 0.083% nebulizer solution INHALE THE CONTENTS OF 1 VIAL VIA NEBULIZER EVERY 6 HOURS AS NEEDED FOR WHEEZING   . amLODipine (NORVASC) 10 MG tablet Take 1 tablet (10 mg total) by mouth daily.   Marland Kitchen atenolol (TENORMIN) 100 MG tablet Take 1 tablet (100 mg total) by mouth daily.   Marland Kitchen atorvastatin (LIPITOR) 80 MG tablet Take 1 tablet (80 mg total) by mouth daily.   . baclofen (LIORESAL) 10 MG tablet Take 1 tablet (10 mg total) by mouth 3 (three) times daily as needed for muscle spasms. Reported on 06/27/2016   . benazepril (LOTENSIN) 40 MG tablet TAKE 1 TABLET BY MOUTH DAILY   . diphenhydrAMINE (BENADRYL) 25 MG tablet Take 2 tablets (50 mg total) by mouth at bedtime as needed.   Marland Kitchen EASY TOUCH PEN NEEDLES 31G X 8 MM MISC USE TO INJECT INSULIN TWICE DAILY   . fluticasone (FLONASE) 50 MCG/ACT nasal spray PLACE 1 SPRAY INTO BOTH NOSTRILS DAILY   . furosemide (LASIX) 40 MG tablet Take 1 tablet (40 mg total) by mouth daily as needed.   . hydrochlorothiazide (HYDRODIURIL) 25 MG tablet Take 1 tablet (25 mg total) by mouth daily.   . Insulin Lispro Prot & Lispro  (HUMALOG MIX 75/25 KWIKPEN) (75-25) 100 UNIT/ML Kwikpen INJECT 135 UNITS SUBCUTANEOUSLY IN THE MORNING BEFORE BREAKFAST & INJECT 85 UNITS SUBCUTANEOUSLY IN THE EVENING BEFORE EVENING MEAL   . ipratropium (ATROVENT) 0.02 % nebulizer solution USE 1 VIAL VIA NEBULIZER EVERY 6 HOURS AS NEEDED FOR WHEEZING   . lidocaine (LIDODERM) 5 % Place 1 patch onto the skin daily. Remove & Discard patch within 12 hours or as directed by MD (Patient not taking: Reported on 06/05/2016)   . LYRICA 200 MG capsule TAKE 1 CAPSULE BY MOUTH THREE TIMES A DAY   . metFORMIN (GLUCOPHAGE XR) 500 MG 24 hr tablet Take 1 tablet (500 mg total) by mouth daily with breakfast.   . nystatin (MYCOSTATIN) powder APPLY TO AFFECTED AREA THREE TIMES A DAY   . ONETOUCH DELICA LANCETS 99991111 MISC USE TO CHECK BLOOD SUGARS FOUR TIMES A DAY   . ONETOUCH VERIO test strip USE 3-4 TIMES DAILY TO CHECK BLOOD SUGAR   . oxyCODONE-acetaminophen (PERCOCET/ROXICET) 5-325 MG tablet Take 1-2 tablets by mouth every 4 (four) hours as needed for severe pain. 06/27/2016: Reports that she takes 2 tablets every 4 hours unless aslepp  . potassium chloride SA (K-DUR,KLOR-CON) 20 MEQ tablet TAKE 2 TABLETS BY MOUTH DAILY   . PROAIR HFA 108 (90 Base) MCG/ACT inhaler INHALE 2 PUFFS BY MOUTH EVERY 6 HOURS AS NEEDED FOR WHEEZING   .  ranitidine (ZANTAC) 150 MG tablet Take 1 tablet (150 mg total) by mouth 2 (two) times daily.   . sertraline (ZOLOFT) 100 MG tablet Take 1 tablet (100 mg total) by mouth daily.   . travoprost, benzalkonium, (TRAVATAN) 0.004 % ophthalmic solution Place 1 drop into both eyes at bedtime.    . traZODone (DESYREL) 100 MG tablet TAKE 1 TABLET BY MOUTH DAILY AT BEDTIME AS NEEDED FOR SLEEP    No facility-administered encounter medications on file as of 08/05/2016.     Functional Status:  In your present state of health, do you have any difficulty performing the following activities: 07/25/2016 06/30/2016  Hearing? N Y  Vision? N Y  Difficulty  concentrating or making decisions? N N  Walking or climbing stairs? Y Y  Dressing or bathing? Y N  Doing errands, shopping? [name]  Preparing Food and eating ? N -  Using the Toilet? N -  Managing your Medications? N -  Managing your Finances? N -  Housekeeping or managing your Housekeeping? N -  Some recent data might be hidden    Fall/Depression Screening: PHQ 2/9 Scores 06/30/2016 06/27/2016 06/05/2016 02/28/2016 01/03/2016 10/18/2015 09/06/2015  PHQ - 2 Score 6 6 1 4 1 3 6   PHQ- 9 Score 13 13 - 9 - 13 24    Assessment: Patient reports that she is coughing up yellow sputum and has felt a little short of breath for past couple of weeks. Stated that she has been having to use her nebulizer and that she has been using it around twice a day. Advised patient that per prescription, she could use it every 6 hours if she feels she needs to. She stated that it helps a little and has been using it because her inhaler does not seem to help. Patient has an appointment with her PCP on 08/07/2016. Encouraged to call 911 if symptoms worsen.  Patient reports that she has already been cutting back on smoking on her own. Stated that she normally smokes 1  to 2 packs per day. She reports that she got a pack of cigarettes last Friday and that lasted her until Monday. RNCM congratulated patient on her desire to quit and decreasing her daily cigarette usage. Patient has an appointment this week to discuss smoking cessation with her PCP.   RNCM and patient discussed transportation needs. RNCM advised patient that [employer] was no longer approved and that she will need to plan to find some long term options. RNCM contacted [employer] with patient and verified that they can do one wheelchair transportation per month. They will send information packet via mail to patient and she verbalized understanding that she needs to complete it and send it back it.   Patient stated that she can go to the wound care center on 8/16 via  [employer]. Stated that she has the money for Wednesday's appointment. with the wound care center, and she thinks her son will be paying her back so that she can go to her PCP appointment. on Thursday by [employer]. RNCM offered to call [employer] and arrange for transportation for appointment. on 8/16 and patient refused, stating she would call them later. RNCM reminded her to call before  5 pm. RNCM and patient discussed that as she starts to decrease her smoking, she could set aside her money that she normally spends on cigarettes for transportation needs and patient verbalized understanding.  Patient stated that the doctor removed her dressing to her right leg last week and  the blisters have healed. She remains in a compression dressing that her doctor is to change at her next appointment. She still has a dressing to her toe, dressing is clean, dry and intact. Patient has home health nursing coming out for continued dressing changes. Patient stated that the doctor told her that the toe wound looks good, but he "keeps cutting on it." Patient stated that she is unsure what he is doing to it. Patient also reports that he wants her to elevate as much as she can. Her leg still has some swelling. RNCM inquired about her weight-bearing status and she stated that he has not said that she cannot be on be on it. Per patient, she currently has no restrictions and still gets up on it for toileting. At other times, she lays in bed to keep it elevated. Patient is wearing a shoe that she was given by the wound care center. She stated that her physical therapist stated that it was not a walking shoe and that it was not safe for her to be walking with it on. She stated that she had called last Thursday to request a shoe that she could use when standing and walking, but has not heard anything. She stated that she has almost fallen because she has been up with the shoe and it keeps her from participating in therapy. RNCM encouraged patient to not  be up with this show on until she can talk with wound care center and get appropriate shoe. Patient does have an appointment tomorrow and plans to address it when she is there. Patient reports that she sees Dr. Con Memos at [facility] [place].   Patient reports that she does have a glucometer at home and that [name] has her one that she plans to pick up on Thursday this week. Stated that she has not yet checked her blood sugar today because she just got up. Patient was going to check her blood sugar when RNCM was present, but she could not find her lancing device. She stated that she has several in home and will find one later so she can check her sugar. Patient reports that she did check her blood sugar when she woke up yesterday and it was "380 something." Patient is on insulin, Humalog 75-25 - she is supposed to inject 135 units in the morning before breakfast and 85 units in the evening before her evening meal. Patient is usually not up in the morning and stated that she takes it when she gets up and does eat something after taking it. She stated that she usually eats dinner anywhere between 8 pm and 10:30 pm and takes her night time insulin at that time.  Plan: RNCM will continue to provide education and monitoring of COPD. May need to address diabetes in near future as patient has not recently been to see her RD at her PCP office for diabetes management.   Patient has an appointment with Dr. Con Memos at wound care center on 08/06/2016 - routine weekly appointment and patient to request a shoe she can walk in Patient has an appointment with Dr. [name] on 08/07/2016 - plans to discuss cough and smoking cessation  RNCM will follow up with patient next week.  [place] R. Charlize Hathaway, RN, BSN, [place] Management Coordinator [phone]

## 2016-08-06 NOTE — Patient Outreach (Signed)
[facility]) [place]  08/05/2016  RNCM completed successful outreach to [employer] to discuss transportation options for patient and spoke with Delfino Lovett. Per discussion with Delfino Lovett, they can transport patients to appointments once a week for ambulatory patients, but requires a 7-10 day notice due to setting up volunteers. They currently do not have any transportation available until around September 16. Also, if patient requires a wheelchair transportation, this is limited to once a month. They contract with an agency to provide wheelchair accessible transportation and they cover the cost. This also requires a 7-10 day advance notice, and would be available now.  Patient will need to sign up for services and Delfino Lovett is going to send packet via mail to patient at her home address. Patient is agreeable.  Patient encouraged to fill out registration information and submit it back to [employer] and encouraged to call RNCM if she has any difficulty filling this out.  [place] R. Thena Devora, RN, BSN, [place] Management Coordinator [phone]

## 2016-08-07 ENCOUNTER — Encounter: Payer: Self-pay | Admitting: Internal Medicine

## 2016-08-07 ENCOUNTER — Ambulatory Visit (INDEPENDENT_AMBULATORY_CARE_PROVIDER_SITE_OTHER): Payer: Medicare Other | Admitting: Internal Medicine

## 2016-08-07 VITALS — BP 176/68 | HR 70 | Temp 97.6°F | Wt 275.0 lb

## 2016-08-07 DIAGNOSIS — J42 Unspecified chronic bronchitis: Secondary | ICD-10-CM

## 2016-08-07 DIAGNOSIS — B370 Candidal stomatitis: Secondary | ICD-10-CM

## 2016-08-07 DIAGNOSIS — F332 Major depressive disorder, recurrent severe without psychotic features: Secondary | ICD-10-CM

## 2016-08-07 DIAGNOSIS — J449 Chronic obstructive pulmonary disease, unspecified: Secondary | ICD-10-CM | POA: Diagnosis not present

## 2016-08-07 DIAGNOSIS — F17210 Nicotine dependence, cigarettes, uncomplicated: Secondary | ICD-10-CM

## 2016-08-07 DIAGNOSIS — Z993 Dependence on wheelchair: Secondary | ICD-10-CM

## 2016-08-07 DIAGNOSIS — Z0000 Encounter for general adult medical examination without abnormal findings: Secondary | ICD-10-CM

## 2016-08-07 DIAGNOSIS — Z89512 Acquired absence of left leg below knee: Secondary | ICD-10-CM

## 2016-08-07 DIAGNOSIS — Z720 Tobacco use: Secondary | ICD-10-CM

## 2016-08-07 MED ORDER — VARENICLINE TARTRATE 1 MG PO TABS: 1.0000 mg | ORAL_TABLET | Freq: Two times a day (BID) | ORAL | 0 refills | Status: DC

## 2016-08-07 MED ORDER — VARENICLINE TARTRATE 0.5 MG PO TABS: ORAL_TABLET | ORAL | 0 refills | Status: DC

## 2016-08-07 MED ORDER — NYSTATIN 100000 UNIT/ML MT SUSP: 5.0000 mL | Freq: Four times a day (QID) | OROMUCOSAL | Status: DC

## 2016-08-07 MED ORDER — BENZONATATE 100 MG PO CAPS: 100.0000 mg | ORAL_CAPSULE | Freq: Four times a day (QID) | ORAL | 0 refills | Status: DC | PRN

## 2016-08-07 MED ORDER — NYSTATIN 100000 UNIT/ML MT SUSP: 5.0000 mL | Freq: Four times a day (QID) | OROMUCOSAL | 0 refills | Status: DC

## 2016-08-07 MED ORDER — AMOXICILLIN-POT CLAVULANATE 875-125 MG PO TABS: 1.0000 | ORAL_TABLET | Freq: Two times a day (BID) | ORAL | 0 refills | Status: AC

## 2016-08-07 NOTE — Patient Instructions (Signed)
1. Try the tessalon perles for cough 2. Take the augmentin for the infection. It may take you another week to get better 3. For smoking  1. Choose a stop date  2. One week before the stop date start chantix 0.5 once a day for 3 days  3. Then chantix 0.5 twice a day for 4 days  4. Then stop smoking and take chantix 1 mg twice a day 4. See me one month to see how the chantix is doing 5. Stop the chantix if thoughts of suicide, worse depression, mood changes, personality  Changes or other side effects

## 2016-08-07 NOTE — Assessment & Plan Note (Signed)
She is on advair. She has early oral candidiasis.  PLAN : Nystatin swish and swallow Instructed to rinse mouth, gargle, brush teeth after use.

## 2016-08-07 NOTE — Assessment & Plan Note (Signed)
Ms Fells c/o 2 weeks of "not abl to breathe" for 2 weeks and worsening. Dyspnea at all times and worse with coughing spells. She had two coughing spells when I was present. They are productive. Also has nasal congestion, sinus congestion, sinus tenderness, HA, sore throat, muscle aches, D, sneezing, and watery painful eyes. She has had no sick contacts. She denies V or decreased oral intake. She states her home health nurse listened to her lungs on the 14th and 15th and said they were OK. She is using her albuterol neb TID and her alb MDI TID.   Her exam indicates this is all upper airway without involvement of lower airway. Since this has been 2 weeks and worsing, I am assuming that there is a bacterial component to her illness - either sinusitis or a bronchitis. She was tx with augmentin in 2014 and has no allergy listed.  PLAN Augmentin 875 BID for 7 days Tesselson perles Has opioids (oral) for cough suppressant also Time  Maintain hydration

## 2016-08-07 NOTE — Assessment & Plan Note (Addendum)
This appt was for smoking cessation but ended up primarily for her URI.   She smokes about 1.5 PPD. She has nor had success with nicotine replacement nor wellbutrin. She came wanting to speak about chantix. chantix will not interact with any of her other meds. She does have sig mental health issues and we dicussed this. She admitted to SI yrs ago but is not in that place now. She stopped her zoloft weeks ago bc it was not helping. Not F/U with mental health bc of co-pays. I wonder if her dx is dysthymia or major depression. I would think the former bc it has been unresponsive to numerous depression meds. We spoke in depth about the SE of chantix - SI, mood changes, personality changes, worsening depression... I feel that the benefit of the med, esp if leads to cessation, outweigh the risks. If she cont to smoke, she wil undoubtedly die from sequela of tobacco use. She is willing to accept risks of chantix. I requested that she not start it until over URI.   PLAN :  Chantix Appt 1 month to assess response

## 2016-08-07 NOTE — Assessment & Plan Note (Signed)
She stopped her zolofft on her own bc not working. Refusing mental health 2/2 co-pays.  PLAN : cont to encourage mental health appt

## 2016-08-07 NOTE — Progress Notes (Signed)
   Subjective:    [name], female    DOB: Dec 17, 1961, 55 y.o.   MRN: [id number]  HPI  YOLI CRARY is here for cold sxs. Please see the A&P for the status of the pt's chronic medical problems.  ROS : per ROS section and in problem oriented charting. All other systems are negative.  PMHx, Soc hx, and / or Fam hx : s/p L BKA. Uses manual chair. Lives with two sons, one sig other of the son, and her kids.  Review of Systems  Constitutional: Negative for appetite change.  HENT: Positive for congestion, rhinorrhea, sinus pressure, sneezing and sore throat. Negative for trouble swallowing.   Eyes: Positive for discharge, redness and itching.  Respiratory: Positive for cough and shortness of breath. Negative for wheezing.   Cardiovascular: Negative for chest pain and leg swelling.  Gastrointestinal: Positive for diarrhea and nausea. Negative for vomiting.  Musculoskeletal: Positive for arthralgias, back pain, gait problem and myalgias.  Skin: Positive for wound. Negative for color change, pallor and rash.       Wound care managing R 5th toe ulcer  Psychiatric/Behavioral: Positive for dysphoric mood and sleep disturbance. Negative for suicidal ideas.       Objective:   Physical Exam  Constitutional: She appears well-developed and well-nourished. No distress.  HENT:  Head: Normocephalic and atraumatic.  Right Ear: External ear normal.  Left Ear: External ear normal.  Nose: Nose normal.  4 or 5 pinpoint white patches on hard palate.  Eyes:  Injected sclera, watery drainage.  Neck: Normal range of motion. Neck supple.  Cardiovascular: Normal rate, regular rhythm and normal heart sounds.   Pulmonary/Chest: Effort normal and breath sounds normal. No respiratory distress. She has no wheezes.  Musculoskeletal: Normal range of motion. She exhibits no edema or tenderness.  R leg wrapped.   Lymphadenopathy:    She has no cervical adenopathy.  Neurological: She is  alert.  Skin: Skin is warm and dry. No rash noted. She is not diaphoretic. No erythema. No pallor.  Psychiatric: She has a normal mood and affect. Her behavior is normal. Judgment and thought content normal.          Assessment & Plan:

## 2016-08-08 DIAGNOSIS — M797 Fibromyalgia: Secondary | ICD-10-CM | POA: Diagnosis not present

## 2016-08-08 DIAGNOSIS — H409 Unspecified glaucoma: Secondary | ICD-10-CM | POA: Diagnosis not present

## 2016-08-08 DIAGNOSIS — J449 Chronic obstructive pulmonary disease, unspecified: Secondary | ICD-10-CM | POA: Diagnosis not present

## 2016-08-08 DIAGNOSIS — M069 Rheumatoid arthritis, unspecified: Secondary | ICD-10-CM | POA: Diagnosis not present

## 2016-08-08 DIAGNOSIS — I159 Secondary hypertension, unspecified: Secondary | ICD-10-CM | POA: Diagnosis not present

## 2016-08-08 DIAGNOSIS — G894 Chronic pain syndrome: Secondary | ICD-10-CM | POA: Diagnosis not present

## 2016-08-11 DIAGNOSIS — H409 Unspecified glaucoma: Secondary | ICD-10-CM | POA: Diagnosis not present

## 2016-08-11 DIAGNOSIS — I159 Secondary hypertension, unspecified: Secondary | ICD-10-CM | POA: Diagnosis not present

## 2016-08-11 DIAGNOSIS — J449 Chronic obstructive pulmonary disease, unspecified: Secondary | ICD-10-CM | POA: Diagnosis not present

## 2016-08-11 DIAGNOSIS — M069 Rheumatoid arthritis, unspecified: Secondary | ICD-10-CM | POA: Diagnosis not present

## 2016-08-11 DIAGNOSIS — M797 Fibromyalgia: Secondary | ICD-10-CM | POA: Diagnosis not present

## 2016-08-11 DIAGNOSIS — G894 Chronic pain syndrome: Secondary | ICD-10-CM | POA: Diagnosis not present

## 2016-08-12 ENCOUNTER — Ambulatory Visit: Payer: Self-pay

## 2016-08-13 ENCOUNTER — Telehealth: Payer: Self-pay | Admitting: *Deleted

## 2016-08-13 DIAGNOSIS — I87321 Chronic venous hypertension (idiopathic) with inflammation of right lower extremity: Secondary | ICD-10-CM | POA: Diagnosis not present

## 2016-08-13 DIAGNOSIS — I890 Lymphedema, not elsewhere classified: Secondary | ICD-10-CM | POA: Diagnosis not present

## 2016-08-13 DIAGNOSIS — H409 Unspecified glaucoma: Secondary | ICD-10-CM | POA: Diagnosis not present

## 2016-08-13 DIAGNOSIS — J449 Chronic obstructive pulmonary disease, unspecified: Secondary | ICD-10-CM | POA: Diagnosis not present

## 2016-08-13 DIAGNOSIS — E1140 Type 2 diabetes mellitus with diabetic neuropathy, unspecified: Secondary | ICD-10-CM | POA: Diagnosis not present

## 2016-08-13 DIAGNOSIS — I159 Secondary hypertension, unspecified: Secondary | ICD-10-CM | POA: Diagnosis not present

## 2016-08-13 DIAGNOSIS — G894 Chronic pain syndrome: Secondary | ICD-10-CM | POA: Diagnosis not present

## 2016-08-13 DIAGNOSIS — M797 Fibromyalgia: Secondary | ICD-10-CM | POA: Diagnosis not present

## 2016-08-13 DIAGNOSIS — I509 Heart failure, unspecified: Secondary | ICD-10-CM | POA: Diagnosis not present

## 2016-08-13 DIAGNOSIS — L97512 Non-pressure chronic ulcer of other part of right foot with fat layer exposed: Secondary | ICD-10-CM | POA: Diagnosis not present

## 2016-08-13 DIAGNOSIS — E11621 Type 2 diabetes mellitus with foot ulcer: Secondary | ICD-10-CM | POA: Diagnosis not present

## 2016-08-13 DIAGNOSIS — M069 Rheumatoid arthritis, unspecified: Secondary | ICD-10-CM | POA: Diagnosis not present

## 2016-08-13 DIAGNOSIS — Z87891 Personal history of nicotine dependence: Secondary | ICD-10-CM | POA: Diagnosis not present

## 2016-08-13 DIAGNOSIS — E1151 Type 2 diabetes mellitus with diabetic peripheral angiopathy without gangrene: Secondary | ICD-10-CM | POA: Diagnosis not present

## 2016-08-13 NOTE — Telephone Encounter (Signed)
Fax from [facility] - Please clarify the qty for Chantix. States it comes in unbreakable packets of # 56 tabs per box.  Thanks

## 2016-08-13 NOTE — Telephone Encounter (Signed)
Both 0.5 mg and 1 mg.

## 2016-08-13 NOTE — Telephone Encounter (Signed)
Which dose of chantix - 0.5 or 1 mg? I Rx'd both.

## 2016-08-13 NOTE — Telephone Encounter (Signed)
Form completed by Dr [name] and faxed to back to [facility]; explaining exactly what she wants pt to have.

## 2016-08-14 ENCOUNTER — Telehealth: Payer: Self-pay | Admitting: Internal Medicine

## 2016-08-14 NOTE — Telephone Encounter (Signed)
Verbal order given to Chariffe, physical therapist at [facility] - to start  PT next week 2 times a week for 4 weeks. If not ok, let me know.

## 2016-08-14 NOTE — Telephone Encounter (Signed)
Needs order for physical therapy for 2x week for 4 weeks

## 2016-08-14 NOTE — Telephone Encounter (Signed)
OK with me.

## 2016-08-15 ENCOUNTER — Other Ambulatory Visit: Payer: Self-pay

## 2016-08-15 ENCOUNTER — Telehealth: Payer: Self-pay

## 2016-08-15 DIAGNOSIS — E1159 Type 2 diabetes mellitus with other circulatory complications: Secondary | ICD-10-CM | POA: Diagnosis not present

## 2016-08-15 DIAGNOSIS — L97519 Non-pressure chronic ulcer of other part of right foot with unspecified severity: Secondary | ICD-10-CM | POA: Diagnosis not present

## 2016-08-15 DIAGNOSIS — I5032 Chronic diastolic (congestive) heart failure: Secondary | ICD-10-CM | POA: Diagnosis not present

## 2016-08-15 DIAGNOSIS — M069 Rheumatoid arthritis, unspecified: Secondary | ICD-10-CM | POA: Diagnosis not present

## 2016-08-15 DIAGNOSIS — I159 Secondary hypertension, unspecified: Secondary | ICD-10-CM | POA: Diagnosis not present

## 2016-08-15 DIAGNOSIS — E1151 Type 2 diabetes mellitus with diabetic peripheral angiopathy without gangrene: Secondary | ICD-10-CM | POA: Diagnosis not present

## 2016-08-15 DIAGNOSIS — E11319 Type 2 diabetes mellitus with unspecified diabetic retinopathy without macular edema: Secondary | ICD-10-CM | POA: Diagnosis not present

## 2016-08-15 DIAGNOSIS — L97511 Non-pressure chronic ulcer of other part of right foot limited to breakdown of skin: Secondary | ICD-10-CM | POA: Diagnosis not present

## 2016-08-15 DIAGNOSIS — E1142 Type 2 diabetes mellitus with diabetic polyneuropathy: Secondary | ICD-10-CM | POA: Diagnosis not present

## 2016-08-15 DIAGNOSIS — M797 Fibromyalgia: Secondary | ICD-10-CM | POA: Diagnosis not present

## 2016-08-15 DIAGNOSIS — M4696 Unspecified inflammatory spondylopathy, lumbar region: Secondary | ICD-10-CM | POA: Diagnosis not present

## 2016-08-15 DIAGNOSIS — H409 Unspecified glaucoma: Secondary | ICD-10-CM | POA: Diagnosis not present

## 2016-08-15 DIAGNOSIS — E11621 Type 2 diabetes mellitus with foot ulcer: Secondary | ICD-10-CM | POA: Diagnosis not present

## 2016-08-15 DIAGNOSIS — G894 Chronic pain syndrome: Secondary | ICD-10-CM | POA: Diagnosis not present

## 2016-08-15 DIAGNOSIS — J449 Chronic obstructive pulmonary disease, unspecified: Secondary | ICD-10-CM | POA: Diagnosis not present

## 2016-08-15 DIAGNOSIS — E1139 Type 2 diabetes mellitus with other diabetic ophthalmic complication: Secondary | ICD-10-CM | POA: Diagnosis not present

## 2016-08-15 NOTE — Telephone Encounter (Signed)
Charisse from [facility] requesting to speak with a nurse regarding pt bp.

## 2016-08-15 NOTE — Telephone Encounter (Signed)
[place] from [facility] requesting to speak with a nurse regarding antibiotic.

## 2016-08-15 NOTE — Telephone Encounter (Signed)
BP usually high. To ED if any sxs inc CP, weakness. Otherwise should come back to her baseline.

## 2016-08-15 NOTE — Telephone Encounter (Signed)
Returned call - Talked to [place] - stated pt did not take her medication; BP 190/90 around 1300PM. Family was there; pt upset "family drama"; did not sleep last night. She stated she told pt to go to ED if herBP did not go down.

## 2016-08-18 ENCOUNTER — Other Ambulatory Visit: Payer: Self-pay | Admitting: *Deleted

## 2016-08-18 DIAGNOSIS — I159 Secondary hypertension, unspecified: Secondary | ICD-10-CM | POA: Diagnosis not present

## 2016-08-18 DIAGNOSIS — J449 Chronic obstructive pulmonary disease, unspecified: Secondary | ICD-10-CM | POA: Diagnosis not present

## 2016-08-18 DIAGNOSIS — H409 Unspecified glaucoma: Secondary | ICD-10-CM | POA: Diagnosis not present

## 2016-08-18 DIAGNOSIS — G894 Chronic pain syndrome: Secondary | ICD-10-CM | POA: Diagnosis not present

## 2016-08-18 DIAGNOSIS — M069 Rheumatoid arthritis, unspecified: Secondary | ICD-10-CM | POA: Diagnosis not present

## 2016-08-18 DIAGNOSIS — M797 Fibromyalgia: Secondary | ICD-10-CM | POA: Diagnosis not present

## 2016-08-18 NOTE — Telephone Encounter (Signed)
Call from pt - states she has not received her medications from [facility] ;states her doctor order 4 new medications 2 weeks ago received only 1. She had called [facility]. Did not receive Amoxicillin - has drop off her med list. Tessalon Perles and Chantix. [facility] - was called; Amoxicillin and Chanitx were shipped out on 8/23. And Tessalon perles not covered by her insurance;cost $80.00. [employer] representative (on conference call w/pt) states she will work on getting this medication for the pt. Pt states if she has not receive meds in the next few days, she will call back.

## 2016-08-18 NOTE — Telephone Encounter (Signed)
Spoke to [place]. Nothing I need to do now.

## 2016-08-19 NOTE — Patient Outreach (Signed)
[name]) Care Management  08/15/2016  Jennifer [name] Oct 28, 1961 [id number]  Successful outreach completed to patient. Patient identification verified. Patient that she is not feeling well today. Stated that she had home health physical therapy come out today and she did not participate because she did not feel like doing anything due to generalized pain. Patient stated that therapy will be on hold and when she is feeling better, she is to call them and they will resume services. Patient reports "ghost pain" today that is bothering her really bad. Patient has pain medication, but reports that it does not always help with all of her pain.  Patient reported that she went to her last PCP appointment. Stated that she her doctor was reluctant to give her Chantix due to the suicidal risks but she did prescribe it. Stated that she has not received it yet because there was a delay at the mail order pharmacy. Patient reported that she called the clinic and told them that they were not sending her medicine. Patient reports that she also talked with her PCP about her cough and reports that she has an infection in her chest and eyes. Stated that she did received the nystatin, but has not received any other medication yet for her chest infection. Patient reported that it was ordered at her mail order pharmacy and she was told that she would not receive them until August 31. Stated that [facility] and [facility] could have [facility] them [facility].  Patient has no other complaints today. Advised RNCM will contact Dr. Zenovia Jarred office and request to [facility] for chest infection. Patient agreeable and stated if they change them, she will be able to have someone pick them up once they are ready. Patient agreeable to outreach in the next week. [place] R. Neave Lenger, RN, BSN, [place] Management Coordinator [phone]

## 2016-08-19 NOTE — Patient Outreach (Signed)
08/15/2016  Jennifer [name]  February 06, 1961 [id number]  Completed outreach to Dr. Zenovia Jarred office and left message for triage nurse. Advised patient was recently seen on 08/07/2016 by Dr. [name] and ordered tessalon perles and augmentin, but that they were called into her mail order pharmacy. Advised that patient was told by pharmacy that she would not receive them until 8/31 and patient was hoping that [facility] so she can start them. RNCM provided patient identification information and RNCM contact information. Advised [facility] [address] in [place] ([address], [place], [place] [address]). Jennifer R. Shigeko Manard, RN, BSN, [place] Management Coordinator [phone]

## 2016-08-20 DIAGNOSIS — I890 Lymphedema, not elsewhere classified: Secondary | ICD-10-CM | POA: Diagnosis not present

## 2016-08-20 DIAGNOSIS — L97512 Non-pressure chronic ulcer of other part of right foot with fat layer exposed: Secondary | ICD-10-CM | POA: Diagnosis not present

## 2016-08-20 DIAGNOSIS — M069 Rheumatoid arthritis, unspecified: Secondary | ICD-10-CM | POA: Diagnosis not present

## 2016-08-20 DIAGNOSIS — J449 Chronic obstructive pulmonary disease, unspecified: Secondary | ICD-10-CM | POA: Diagnosis not present

## 2016-08-20 DIAGNOSIS — E11621 Type 2 diabetes mellitus with foot ulcer: Secondary | ICD-10-CM | POA: Diagnosis not present

## 2016-08-20 DIAGNOSIS — L97511 Non-pressure chronic ulcer of other part of right foot limited to breakdown of skin: Secondary | ICD-10-CM | POA: Diagnosis not present

## 2016-08-20 DIAGNOSIS — E1140 Type 2 diabetes mellitus with diabetic neuropathy, unspecified: Secondary | ICD-10-CM | POA: Diagnosis not present

## 2016-08-20 DIAGNOSIS — E1151 Type 2 diabetes mellitus with diabetic peripheral angiopathy without gangrene: Secondary | ICD-10-CM | POA: Diagnosis not present

## 2016-08-20 DIAGNOSIS — H409 Unspecified glaucoma: Secondary | ICD-10-CM | POA: Diagnosis not present

## 2016-08-20 DIAGNOSIS — I509 Heart failure, unspecified: Secondary | ICD-10-CM | POA: Diagnosis not present

## 2016-08-20 DIAGNOSIS — I87321 Chronic venous hypertension (idiopathic) with inflammation of right lower extremity: Secondary | ICD-10-CM | POA: Diagnosis not present

## 2016-08-21 ENCOUNTER — Ambulatory Visit: Payer: Medicare Other | Admitting: Dietician

## 2016-08-22 ENCOUNTER — Telehealth: Payer: Self-pay | Admitting: Dietician

## 2016-08-22 ENCOUNTER — Ambulatory Visit (INDEPENDENT_AMBULATORY_CARE_PROVIDER_SITE_OTHER): Payer: Medicare Other | Admitting: Dietician

## 2016-08-22 DIAGNOSIS — G894 Chronic pain syndrome: Secondary | ICD-10-CM | POA: Diagnosis not present

## 2016-08-22 DIAGNOSIS — M4696 Unspecified inflammatory spondylopathy, lumbar region: Secondary | ICD-10-CM | POA: Diagnosis not present

## 2016-08-22 DIAGNOSIS — M069 Rheumatoid arthritis, unspecified: Secondary | ICD-10-CM | POA: Diagnosis not present

## 2016-08-22 DIAGNOSIS — E11319 Type 2 diabetes mellitus with unspecified diabetic retinopathy without macular edema: Secondary | ICD-10-CM | POA: Diagnosis not present

## 2016-08-22 DIAGNOSIS — M797 Fibromyalgia: Secondary | ICD-10-CM | POA: Diagnosis not present

## 2016-08-22 DIAGNOSIS — H409 Unspecified glaucoma: Secondary | ICD-10-CM | POA: Diagnosis not present

## 2016-08-22 DIAGNOSIS — L97511 Non-pressure chronic ulcer of other part of right foot limited to breakdown of skin: Secondary | ICD-10-CM | POA: Diagnosis not present

## 2016-08-22 DIAGNOSIS — I5032 Chronic diastolic (congestive) heart failure: Secondary | ICD-10-CM | POA: Diagnosis not present

## 2016-08-22 DIAGNOSIS — E1151 Type 2 diabetes mellitus with diabetic peripheral angiopathy without gangrene: Secondary | ICD-10-CM

## 2016-08-22 DIAGNOSIS — J449 Chronic obstructive pulmonary disease, unspecified: Secondary | ICD-10-CM | POA: Diagnosis not present

## 2016-08-22 DIAGNOSIS — Z713 Dietary counseling and surveillance: Secondary | ICD-10-CM

## 2016-08-22 DIAGNOSIS — E11621 Type 2 diabetes mellitus with foot ulcer: Secondary | ICD-10-CM | POA: Diagnosis not present

## 2016-08-22 DIAGNOSIS — E1142 Type 2 diabetes mellitus with diabetic polyneuropathy: Secondary | ICD-10-CM | POA: Diagnosis not present

## 2016-08-22 DIAGNOSIS — E1165 Type 2 diabetes mellitus with hyperglycemia: Secondary | ICD-10-CM | POA: Diagnosis not present

## 2016-08-22 DIAGNOSIS — E1139 Type 2 diabetes mellitus with other diabetic ophthalmic complication: Secondary | ICD-10-CM | POA: Diagnosis not present

## 2016-08-22 DIAGNOSIS — IMO0002 Reserved for concepts with insufficient information to code with codable children: Secondary | ICD-10-CM

## 2016-08-22 DIAGNOSIS — Z794 Long term (current) use of insulin: Secondary | ICD-10-CM | POA: Diagnosis not present

## 2016-08-22 DIAGNOSIS — I159 Secondary hypertension, unspecified: Secondary | ICD-10-CM | POA: Diagnosis not present

## 2016-08-22 DIAGNOSIS — E1159 Type 2 diabetes mellitus with other circulatory complications: Secondary | ICD-10-CM | POA: Diagnosis not present

## 2016-08-22 NOTE — Telephone Encounter (Signed)
Tessalon pearls copay was too high. Patient requests alternative medicine that is more affordable.

## 2016-08-22 NOTE — Progress Notes (Signed)
  Medical Nutrition Therapy:  Appt start time: [id number] end time:  1555. Visit # 4 in series, 1st this year, last visit 09/2014  Assessment:  Primary concerns today: quitting smoking, meter.  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 She set a quit date and reports a decreased to 1/5 pack per day without chantix. Her two sons are living with her. She has trouble affording transportation and sometimes feels trapped in her house because she has trouble getting in and out with her wheelchair. She reports her left BKA is healed and stable and her wound on her right foot is also healing. She complains of neuropathic, phantom  and back pain. Her intake of nutrient dense foods is poor. Doubt she is meeting her needs for  protein, vitamin and minerals.  Preferred Learning-No preference indicated  Learning Readiness:   Not ready for diet or actiivity changes  Ready for quitting smoking  ANTHROPOMETRICS: weight-t273.8#, BMI-43- obese class 3 WEIGHT HISTORY:stable SLEEP:"insomnia"  MEDICATIONS: takes 5 injections/day 5 days a week and 3 in the am to take her 135 units on 2 days of the week.  BLOOD SUGAR:210 today in the office on her mew meter DIETARY INTAKE: Usual eating pattern includes 1-2 meals/snacks per day. Everyday foods include  Soda, subway sandwich today   24-hr recall:  drinks 2 liters of MtDew throughout the day and eats 1-2 meals  Usual physical activity: very limited to ADLs and wheelchair for most of the day.    Progress Towards Goal(s):  In progress.   Nutritional Diagnosis:  NB-1.6 Limited adherence to nutrition-related recommendations As related to competing values and desire to quit smoking.  As evidenced by her report and plan for quitting smoking..    Intervention:  Nutrition education about using multivitamin until ready to make healthier choices,  support on meter,  quitting smoking tips, . Coordination of care: none   Teaching Method Utilized: Visual, Auditory, Hands on  Handouts given during visit include:tips for quitting smoking, Barriers to learning/adherence to lifestyle change: transportation, lack of material resources Demonstrated degree of understanding via:  Teach Back   Monitoring/Evaluation:  Dietary intake, exercise, meter and body weight in 6 months to monitor /assess desire to address lifestyle.

## 2016-08-22 NOTE — Patient Instructions (Addendum)
Reminder: Talk to Dr. [name] about right side of face being swollen/bigger than your left.  Make an appointmentn in September after the 15th so we can check your A1C.   Good Job coming up with a quit date for smoking. September 18th, 2017.  Consider what you will use when you have urges to smoke- keep hands and mouth busy.   Take 2 childrens vitamins each day and when these are gone use 1 adult multivitamin.

## 2016-08-24 DIAGNOSIS — E11319 Type 2 diabetes mellitus with unspecified diabetic retinopathy without macular edema: Secondary | ICD-10-CM | POA: Diagnosis not present

## 2016-08-24 DIAGNOSIS — L97511 Non-pressure chronic ulcer of other part of right foot limited to breakdown of skin: Secondary | ICD-10-CM | POA: Diagnosis not present

## 2016-08-24 DIAGNOSIS — I159 Secondary hypertension, unspecified: Secondary | ICD-10-CM | POA: Diagnosis not present

## 2016-08-24 DIAGNOSIS — M4696 Unspecified inflammatory spondylopathy, lumbar region: Secondary | ICD-10-CM | POA: Diagnosis not present

## 2016-08-24 DIAGNOSIS — E1142 Type 2 diabetes mellitus with diabetic polyneuropathy: Secondary | ICD-10-CM | POA: Diagnosis not present

## 2016-08-24 DIAGNOSIS — E1151 Type 2 diabetes mellitus with diabetic peripheral angiopathy without gangrene: Secondary | ICD-10-CM | POA: Diagnosis not present

## 2016-08-24 DIAGNOSIS — E11621 Type 2 diabetes mellitus with foot ulcer: Secondary | ICD-10-CM | POA: Diagnosis not present

## 2016-08-24 DIAGNOSIS — M797 Fibromyalgia: Secondary | ICD-10-CM | POA: Diagnosis not present

## 2016-08-24 DIAGNOSIS — I5032 Chronic diastolic (congestive) heart failure: Secondary | ICD-10-CM | POA: Diagnosis not present

## 2016-08-24 DIAGNOSIS — M069 Rheumatoid arthritis, unspecified: Secondary | ICD-10-CM | POA: Diagnosis not present

## 2016-08-24 DIAGNOSIS — E1159 Type 2 diabetes mellitus with other circulatory complications: Secondary | ICD-10-CM | POA: Diagnosis not present

## 2016-08-24 DIAGNOSIS — J449 Chronic obstructive pulmonary disease, unspecified: Secondary | ICD-10-CM | POA: Diagnosis not present

## 2016-08-24 DIAGNOSIS — E1139 Type 2 diabetes mellitus with other diabetic ophthalmic complication: Secondary | ICD-10-CM | POA: Diagnosis not present

## 2016-08-26 NOTE — Telephone Encounter (Signed)
I rx'd tessalon perles on 8/17. If still with sxs, needs appt.

## 2016-08-26 NOTE — Telephone Encounter (Signed)
Pt states she continues to have productive cough. When I talked to pt on 8/28, [facility] representative was going to try to help pt purchase tessalon perles but pt states she was unable. So, she mentioned this to Christene Slates on Friday. Appt scheduled 9/7 in [place].

## 2016-08-27 ENCOUNTER — Encounter (HOSPITAL_BASED_OUTPATIENT_CLINIC_OR_DEPARTMENT_OTHER): Payer: Medicare Other | Attending: Surgery

## 2016-08-27 ENCOUNTER — Encounter: Payer: Self-pay | Admitting: *Deleted

## 2016-08-27 ENCOUNTER — Telehealth: Payer: Self-pay | Admitting: Internal Medicine

## 2016-08-27 DIAGNOSIS — M069 Rheumatoid arthritis, unspecified: Secondary | ICD-10-CM | POA: Diagnosis not present

## 2016-08-27 DIAGNOSIS — F17210 Nicotine dependence, cigarettes, uncomplicated: Secondary | ICD-10-CM | POA: Diagnosis not present

## 2016-08-27 DIAGNOSIS — J449 Chronic obstructive pulmonary disease, unspecified: Secondary | ICD-10-CM | POA: Diagnosis not present

## 2016-08-27 DIAGNOSIS — L97521 Non-pressure chronic ulcer of other part of left foot limited to breakdown of skin: Secondary | ICD-10-CM | POA: Diagnosis not present

## 2016-08-27 DIAGNOSIS — I87321 Chronic venous hypertension (idiopathic) with inflammation of right lower extremity: Secondary | ICD-10-CM | POA: Insufficient documentation

## 2016-08-27 DIAGNOSIS — I509 Heart failure, unspecified: Secondary | ICD-10-CM | POA: Insufficient documentation

## 2016-08-27 DIAGNOSIS — L97511 Non-pressure chronic ulcer of other part of right foot limited to breakdown of skin: Secondary | ICD-10-CM | POA: Diagnosis not present

## 2016-08-27 DIAGNOSIS — E1140 Type 2 diabetes mellitus with diabetic neuropathy, unspecified: Secondary | ICD-10-CM | POA: Diagnosis not present

## 2016-08-27 DIAGNOSIS — I890 Lymphedema, not elsewhere classified: Secondary | ICD-10-CM | POA: Insufficient documentation

## 2016-08-27 DIAGNOSIS — E11621 Type 2 diabetes mellitus with foot ulcer: Secondary | ICD-10-CM | POA: Insufficient documentation

## 2016-08-27 NOTE — Telephone Encounter (Signed)
APT. REMINDER CALL, LMTCB °

## 2016-08-28 ENCOUNTER — Ambulatory Visit (INDEPENDENT_AMBULATORY_CARE_PROVIDER_SITE_OTHER): Payer: Medicare Other | Admitting: Internal Medicine

## 2016-08-28 ENCOUNTER — Encounter: Payer: Self-pay | Admitting: Internal Medicine

## 2016-08-28 VITALS — BP 185/85 | HR 59 | Temp 98.4°F | Ht 67.0 in | Wt 276.9 lb

## 2016-08-28 DIAGNOSIS — I152 Hypertension secondary to endocrine disorders: Secondary | ICD-10-CM

## 2016-08-28 DIAGNOSIS — R05 Cough: Secondary | ICD-10-CM | POA: Diagnosis not present

## 2016-08-28 DIAGNOSIS — K219 Gastro-esophageal reflux disease without esophagitis: Secondary | ICD-10-CM

## 2016-08-28 DIAGNOSIS — E1159 Type 2 diabetes mellitus with other circulatory complications: Secondary | ICD-10-CM

## 2016-08-28 DIAGNOSIS — F17210 Nicotine dependence, cigarettes, uncomplicated: Secondary | ICD-10-CM

## 2016-08-28 DIAGNOSIS — J449 Chronic obstructive pulmonary disease, unspecified: Secondary | ICD-10-CM

## 2016-08-28 DIAGNOSIS — I10 Essential (primary) hypertension: Secondary | ICD-10-CM

## 2016-08-28 DIAGNOSIS — Z79899 Other long term (current) drug therapy: Secondary | ICD-10-CM

## 2016-08-28 DIAGNOSIS — R052 Subacute cough: Secondary | ICD-10-CM

## 2016-08-28 DIAGNOSIS — Z794 Long term (current) use of insulin: Secondary | ICD-10-CM

## 2016-08-28 MED ORDER — PANTOPRAZOLE SODIUM 40 MG PO TBEC: 40.0000 mg | DELAYED_RELEASE_TABLET | Freq: Every day | ORAL | 1 refills | Status: DC

## 2016-08-28 NOTE — Patient Instructions (Signed)
Start taking Protonix as instructed.  Continue taking Zantac as instructed.   Return to the clinic in 1 month.

## 2016-08-28 NOTE — Progress Notes (Signed)
CC: Cough  HPI:  Ms.Sarahi L Teschner is a 55 y.o. female with a past medical history of conditions listed below presenting to the clinic for follow-up of her cough. Please see problem based charting for the status of the patient's chronic medical conditions.  Past Medical History:  Diagnosis Date  . Anginal pain [facility])    '3' of 10 ischemia ruled out 9/9   . Arthritis of lumbar spine   . Asthma   . CHF (congestive heart failure) ([place])   . 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 Chronic bronchitis ([place])    "I get it alot" (09/28/2013)  . Chronic diastolic heart failure (HCC)    grade 2 per 2D echocardiogram (01/2013)  . Chronic lower back pain   . Chronic osteomyelitis of foot (HCC)    chronic, right secondary to diabetic foot ulcers  . Chronic pain syndrome 12/03/2011   Likely secondary to depression, "fibromyalgia", neuropathy, and obesity. Lumbar MRI 2014 no sig change from prior (2008) : Stable hypertrophic facet disease most notable at L4-5. Stable shallow left foraminal/extraforaminal disc protrusion at L4-5. No direct neural compression.      Marland Kitchen COPD 01/08/2007   PFT's 05/2007 : FEV1/FVC 82, FEV1 64% pred, FEF 25-75% 40% predicted, 16% improvement in FEV1 with bronchodilators.     . Depression   . Diabetic peripheral neuropathy ([place])   . DVT of upper extremity (deep vein thrombosis) ([place]) 03/11/2013   Secondary to PICC line. Right brachial vein, diagnosed on 03/10/2013 Coumadin for 3 months. End date 06/10/2013   . Environmental allergies    Hx: of  . Exertional shortness of breath   . Fatty liver 2003   observed on ultrasound abdomen  . Fibromyalgia   . GERD (gastroesophageal reflux disease)   . Glaucoma   . Hyperlipidemia   . Hyperplastic colon polyp 12/2010   Per colonoscopy (12/2010) - Dr. Deatra Ina  . Hypertension   . 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 Blood cultures 01/26/13 S. Aureus and GBS.  Blood cultures Feb 6th, 8th, and 9th and March were negative.Repeat TEE 3/20 negative for vegitations  . 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 2/2 tobacco use, DM, and cont weight bearing on surgical wound and developed gangrene   . Polymicrobial bacterial infection 01/2013   GBS and S. aureus bacteremia // Source likely infected diabetic foot ulcer  . PVD (peripheral vascular disease) with claudication ([place])    Stents to bilateral common iliac arteries (left 2005, right 2008), on chronic plavix  . Rheumatoid arthritis ([place])   . Tobacco abuse   . Type II diabetes mellitus with peripheral circulatory disorders, uncontrolled DX: 1993   Insulin dep. Poor control. Complicated by diabetic foot ulcer and diabetic eye disease.    Marland Kitchen Ulcer of foot, chronic (HCC)    Left. No OM per MRI (01/2013)    Review of Systems:  Pertinent positives mentioned in history of present illness. Remainder of all review of systems negative.  Physical Exam:  Vitals:   08/28/16 1530  BP: (!) 185/85  Pulse: (!) 59  Temp: 98.4 F (36.9 C)  TempSrc: Oral  SpO2: 91%  Weight: 276 lb 14.4 oz (125.6 kg)  Height: 5\' 7"  (1.702 m)   Physical Exam  Constitutional: She is oriented to person, place, and time. She appears well-developed and well-nourished.  Sitting  in wheelchair  HENT:  Head: Normocephalic and atraumatic.  Eyes: EOM are normal.  Neck: Neck supple. No tracheal deviation present.  Cardiovascular: Normal rate, regular rhythm and intact distal pulses.   Respiratory: Effort normal. No respiratory distress. She has no wheezes.  Mild bibasilar rales  GI: Soft. Bowel sounds are normal. There is no tenderness.  Obese  Neurological: She is alert and oriented to person, place, and time.  Skin: Skin is warm and dry.    Assessment & Plan:   See Encounters Tab for problem based charting.  Patient discussed with Dr. [name]

## 2016-08-28 NOTE — Assessment & Plan Note (Signed)
History of present illness Patient reports having a cough for over a month which is productive of yellow/brown colored sputum. States she is chronically short of breath due to her COPD but believes her shortness of breath has been worse lately. Also reports having rhinorrhea and sinus pressure. Denies having any fevers, chills, or sore throat. Patient reports having continued GERD symptoms despite taking Zantac 150 mg twice daily. States on average she has GERD symptoms 4 times a week. She was prescribed a seven-day course of Augmentin during her previous visits in August and states the medication did not help her at all. States she was not able to pick up Ladona Ridgel from the pharmacy because it is not covered by insurance. Reports taking Benadryl, Mucinex, and using Flonase but to no avail. In addition, reports using all of her COPD inhalers as prescribed. States she continues to smoke cigarettes daily.  Assessment Patient has COPD and is a smoker which could be contributing to her cough. GERD could also be possibly playing a role as she reports worsening symptoms despite taking an H2 blocker regularly. She had mild bibasilar crackles on exam which could be due to her history of COPD. Patient is on an ACE inhibitor for hypertension (Lotensin) which could also be a reason for her cough.   Plan -Trial of PPI for uncontrolled GERD -Continue H2 blocker -If patient continues to have a cough at follow-up visit in one month, consider discontinuing ACE inhibitor

## 2016-08-28 NOTE — Assessment & Plan Note (Signed)
Assessment Patient's blood pressure seems to be chronically elevated and is 185/85 at this visit. Patient states she has been taking all of her medications except Lasix which she stopped taking one month ago. She seemed agitated and did not want to discuss her hypertension at this visit.   Plan -Recheck blood pressure at next visit

## 2016-08-29 ENCOUNTER — Other Ambulatory Visit: Payer: Self-pay | Admitting: Internal Medicine

## 2016-08-29 DIAGNOSIS — E1151 Type 2 diabetes mellitus with diabetic peripheral angiopathy without gangrene: Secondary | ICD-10-CM | POA: Diagnosis not present

## 2016-08-29 DIAGNOSIS — M4696 Unspecified inflammatory spondylopathy, lumbar region: Secondary | ICD-10-CM | POA: Diagnosis not present

## 2016-08-29 DIAGNOSIS — E11319 Type 2 diabetes mellitus with unspecified diabetic retinopathy without macular edema: Secondary | ICD-10-CM | POA: Diagnosis not present

## 2016-08-29 DIAGNOSIS — I5032 Chronic diastolic (congestive) heart failure: Secondary | ICD-10-CM | POA: Diagnosis not present

## 2016-08-29 DIAGNOSIS — J449 Chronic obstructive pulmonary disease, unspecified: Secondary | ICD-10-CM | POA: Diagnosis not present

## 2016-08-29 DIAGNOSIS — E11621 Type 2 diabetes mellitus with foot ulcer: Secondary | ICD-10-CM | POA: Diagnosis not present

## 2016-08-29 DIAGNOSIS — M069 Rheumatoid arthritis, unspecified: Secondary | ICD-10-CM | POA: Diagnosis not present

## 2016-08-29 DIAGNOSIS — E1142 Type 2 diabetes mellitus with diabetic polyneuropathy: Secondary | ICD-10-CM | POA: Diagnosis not present

## 2016-08-29 DIAGNOSIS — M797 Fibromyalgia: Secondary | ICD-10-CM | POA: Diagnosis not present

## 2016-08-29 DIAGNOSIS — I159 Secondary hypertension, unspecified: Secondary | ICD-10-CM | POA: Diagnosis not present

## 2016-08-29 DIAGNOSIS — L97511 Non-pressure chronic ulcer of other part of right foot limited to breakdown of skin: Secondary | ICD-10-CM | POA: Diagnosis not present

## 2016-08-29 DIAGNOSIS — E1159 Type 2 diabetes mellitus with other circulatory complications: Secondary | ICD-10-CM | POA: Diagnosis not present

## 2016-08-29 DIAGNOSIS — E1139 Type 2 diabetes mellitus with other diabetic ophthalmic complication: Secondary | ICD-10-CM | POA: Diagnosis not present

## 2016-08-29 NOTE — Progress Notes (Signed)
Internal Medicine Clinic Attending  Case discussed with Dr. Rathoreat the time of the visit. We reviewed the resident's history and exam and pertinent patient test results. I agree with the assessment, diagnosis, and plan of care documented in the resident's note.  

## 2016-09-01 ENCOUNTER — Telehealth: Payer: Self-pay | Admitting: Student-PharmD

## 2016-09-01 NOTE — Telephone Encounter (Signed)
Obtained fill histories from pt's pharmacies:  [facility] Azithromycin 250 mg filled 06/08/16 #2 Prednisone 20 mg filled 06/08/16 #10 Oxycodone-APAP 5-325 mg filled 06/08/16 #270 Pantoprazole 40 mg filled 08/28/16 #30  Per [facility] Advair 250/50 mg filled 07/28/16 Albuterol 0.083% sol last filled 07/28/16 Amlodipine 10 mg last filled 07/28/16 Atenolol 100 mg last filled 07/28/16 Baclofen 10 mg last filled 06/26/16 Benazepril 40 mg last filled 07/28/16 Furosemide 80 mg last filled 07/28/16 HCTZ 25 mg last filled 06/26/16 Ipratropium 0.02% sol last filled 07/28/16 Lyrica 200 mg last filled 07/28/16 Metformin ER 500 mg last filled 07/28/26 Proair HFA 90 mcg last filled 07/28/16 Ranitidine 150 mg last filled 06/26/16 Sertraline 100 mg last filled 07/28/16 Trazodone 100 last filled 06/26/16  Per [facility] Chantix 1 mg last filled 08/14/16 #56 Chantix 0.5 mg last filled 08/14/16 #56 Augmentin 8-75 last filled 08/13/16 #14 Nystatin 100000 units - last filled 08/13/16 #71mL Baclofen 10 mg - last filled 8/8 #90 HCTZ 25 mg - last filled 8/8 #90 Trazodone 100 mg - last filled 8/8 #90 Atorvastatin 80 mg - last filled 8/8 #90 Ranitidine 150 mg - last filled 8/8 #180 Atenolol 100 mg - never filled Benzonotate 100 mg - never filled  Terald Sleeper PharmD Candidate, c/o 2019 09/01/2016 5:10 PM

## 2016-09-02 ENCOUNTER — Other Ambulatory Visit: Payer: Self-pay | Admitting: Internal Medicine

## 2016-09-02 DIAGNOSIS — E1142 Type 2 diabetes mellitus with diabetic polyneuropathy: Secondary | ICD-10-CM | POA: Diagnosis not present

## 2016-09-02 DIAGNOSIS — J449 Chronic obstructive pulmonary disease, unspecified: Secondary | ICD-10-CM | POA: Diagnosis not present

## 2016-09-02 DIAGNOSIS — M797 Fibromyalgia: Secondary | ICD-10-CM | POA: Diagnosis not present

## 2016-09-02 DIAGNOSIS — E11621 Type 2 diabetes mellitus with foot ulcer: Secondary | ICD-10-CM | POA: Diagnosis not present

## 2016-09-02 DIAGNOSIS — I159 Secondary hypertension, unspecified: Secondary | ICD-10-CM | POA: Diagnosis not present

## 2016-09-02 DIAGNOSIS — E11319 Type 2 diabetes mellitus with unspecified diabetic retinopathy without macular edema: Secondary | ICD-10-CM | POA: Diagnosis not present

## 2016-09-02 DIAGNOSIS — E1151 Type 2 diabetes mellitus with diabetic peripheral angiopathy without gangrene: Secondary | ICD-10-CM | POA: Diagnosis not present

## 2016-09-02 DIAGNOSIS — M4696 Unspecified inflammatory spondylopathy, lumbar region: Secondary | ICD-10-CM | POA: Diagnosis not present

## 2016-09-02 DIAGNOSIS — E1159 Type 2 diabetes mellitus with other circulatory complications: Secondary | ICD-10-CM | POA: Diagnosis not present

## 2016-09-02 DIAGNOSIS — E1139 Type 2 diabetes mellitus with other diabetic ophthalmic complication: Secondary | ICD-10-CM | POA: Diagnosis not present

## 2016-09-02 DIAGNOSIS — L97511 Non-pressure chronic ulcer of other part of right foot limited to breakdown of skin: Secondary | ICD-10-CM | POA: Diagnosis not present

## 2016-09-02 DIAGNOSIS — I5032 Chronic diastolic (congestive) heart failure: Secondary | ICD-10-CM | POA: Diagnosis not present

## 2016-09-02 DIAGNOSIS — M069 Rheumatoid arthritis, unspecified: Secondary | ICD-10-CM | POA: Diagnosis not present

## 2016-09-02 NOTE — Telephone Encounter (Signed)
[place] - informed Trazadone rx was sent to [facility] on 8/2.

## 2016-09-02 NOTE — Telephone Encounter (Signed)
[facility]) requesting a pt's Trazadone 100 mg refill.   She can be reached @ [phone]

## 2016-09-03 DIAGNOSIS — Z09 Encounter for follow-up examination after completed treatment for conditions other than malignant neoplasm: Secondary | ICD-10-CM | POA: Diagnosis not present

## 2016-09-03 DIAGNOSIS — I890 Lymphedema, not elsewhere classified: Secondary | ICD-10-CM | POA: Diagnosis not present

## 2016-09-03 DIAGNOSIS — E1140 Type 2 diabetes mellitus with diabetic neuropathy, unspecified: Secondary | ICD-10-CM | POA: Diagnosis not present

## 2016-09-03 DIAGNOSIS — J449 Chronic obstructive pulmonary disease, unspecified: Secondary | ICD-10-CM | POA: Diagnosis not present

## 2016-09-03 DIAGNOSIS — L97511 Non-pressure chronic ulcer of other part of right foot limited to breakdown of skin: Secondary | ICD-10-CM | POA: Diagnosis not present

## 2016-09-03 DIAGNOSIS — I509 Heart failure, unspecified: Secondary | ICD-10-CM | POA: Diagnosis not present

## 2016-09-03 DIAGNOSIS — Z8631 Personal history of diabetic foot ulcer: Secondary | ICD-10-CM | POA: Diagnosis not present

## 2016-09-03 DIAGNOSIS — I87321 Chronic venous hypertension (idiopathic) with inflammation of right lower extremity: Secondary | ICD-10-CM | POA: Diagnosis not present

## 2016-09-03 DIAGNOSIS — M069 Rheumatoid arthritis, unspecified: Secondary | ICD-10-CM | POA: Diagnosis not present

## 2016-09-03 DIAGNOSIS — E11621 Type 2 diabetes mellitus with foot ulcer: Secondary | ICD-10-CM | POA: Diagnosis not present

## 2016-09-03 NOTE — Telephone Encounter (Signed)
Refilled pen needles and advair. All others had been refilled for one yr in Aug

## 2016-09-03 NOTE — Telephone Encounter (Signed)
The other meds were refilled and sent to [facility] pharmacy .

## 2016-09-04 DIAGNOSIS — M797 Fibromyalgia: Secondary | ICD-10-CM | POA: Diagnosis not present

## 2016-09-04 DIAGNOSIS — I5032 Chronic diastolic (congestive) heart failure: Secondary | ICD-10-CM | POA: Diagnosis not present

## 2016-09-04 DIAGNOSIS — E11621 Type 2 diabetes mellitus with foot ulcer: Secondary | ICD-10-CM | POA: Diagnosis not present

## 2016-09-04 DIAGNOSIS — M4696 Unspecified inflammatory spondylopathy, lumbar region: Secondary | ICD-10-CM | POA: Diagnosis not present

## 2016-09-04 DIAGNOSIS — M069 Rheumatoid arthritis, unspecified: Secondary | ICD-10-CM | POA: Diagnosis not present

## 2016-09-04 DIAGNOSIS — J449 Chronic obstructive pulmonary disease, unspecified: Secondary | ICD-10-CM | POA: Diagnosis not present

## 2016-09-04 DIAGNOSIS — E11319 Type 2 diabetes mellitus with unspecified diabetic retinopathy without macular edema: Secondary | ICD-10-CM | POA: Diagnosis not present

## 2016-09-04 DIAGNOSIS — E1151 Type 2 diabetes mellitus with diabetic peripheral angiopathy without gangrene: Secondary | ICD-10-CM | POA: Diagnosis not present

## 2016-09-04 DIAGNOSIS — E1139 Type 2 diabetes mellitus with other diabetic ophthalmic complication: Secondary | ICD-10-CM | POA: Diagnosis not present

## 2016-09-04 DIAGNOSIS — L97511 Non-pressure chronic ulcer of other part of right foot limited to breakdown of skin: Secondary | ICD-10-CM | POA: Diagnosis not present

## 2016-09-04 DIAGNOSIS — E1159 Type 2 diabetes mellitus with other circulatory complications: Secondary | ICD-10-CM | POA: Diagnosis not present

## 2016-09-04 DIAGNOSIS — I159 Secondary hypertension, unspecified: Secondary | ICD-10-CM | POA: Diagnosis not present

## 2016-09-04 DIAGNOSIS — E1142 Type 2 diabetes mellitus with diabetic polyneuropathy: Secondary | ICD-10-CM | POA: Diagnosis not present

## 2016-09-11 ENCOUNTER — Other Ambulatory Visit: Payer: Self-pay | Admitting: Internal Medicine

## 2016-09-11 ENCOUNTER — Ambulatory Visit: Payer: Medicare Other | Admitting: Internal Medicine

## 2016-09-11 DIAGNOSIS — F1120 Opioid dependence, uncomplicated: Secondary | ICD-10-CM

## 2016-09-11 MED ORDER — OXYCODONE-ACETAMINOPHEN 5-325 MG PO TABS: 1.0000 | ORAL_TABLET | ORAL | 0 refills | Status: DC | PRN

## 2016-09-11 NOTE — Assessment & Plan Note (Signed)
Patient notes for her appointment today which is quite unusual for her.   Patient on oxycodone 5 mg and was given 120 tablets per month. After her BKA, due to increased pain her quantity was increased to 360 tablets per month. This is done so that she could work with physical therapy and increase ambulation. She has not been able to need these goals. She is not ambulating much at all and mostly uses her wheelchair. She has told me in the recent past but she mostly lays in bed all day sleeping. She has very few social activities. Her pain had increased and she also had a new component of pain over the summer 2016. I ruled out claudication and she also had a another MRI of her lumbar spine and plain x-rays of her hips but I was unable to determine the etiology of the new and increased pain. I requested that she see physical medicine and rehabilitation but she canceled 2 appointments and then slept through one appointment. Because she was charged for her no-show, she now states she cannot afford to go.  I started a taper in August 2016. The reasons for the taper are outlined in the review. I am doing a very slow taper. I am decreasing her quantity by 4 pills every 30 days. This corresponds to one fewer pill per week. My goal is not to stop her opioid but to get back to her pre amputation quantity which is 120.  She is going to be due quite soon for her oxycodone refill. She was recently seen on September 7th in [facility]. She was seen by me on August 17. Therefore going to make her schedule an appointment to receive her prescription today. However I will ask that she reschedule her appointment with me in the next 6 weeks or so to address her uncontrolled diabetes and hypertension.

## 2016-09-11 NOTE — Progress Notes (Deleted)
   Subjective:    [name], female    DOB: 08/22/1961, 55 y.o.   MRN: [id number]  HPI  Jennifer [name] is here for DM F/U. Please see the A&P for the status of the pt's chronic medical problems.  ROS : per ROS section and in problem oriented charting. All other systems are negative.  PMHx, Soc hx, and / or Fam hx : Continues to cigarettes despite Chantix.   Review of Systems        Objective:   Physical Exam        Assessment & Plan:

## 2016-09-11 NOTE — Assessment & Plan Note (Deleted)
Patient on oxycodone 5 mg and was given 120 tablets per month. After her BKA, due to increased pain her quantity was increased to 360 tablets per month. This is done so that she could work with physical therapy and increase ambulation. She has not been able to need these goals. She is not ambulating much at all and mostly uses her wheelchair. She has told me in the recent past but she mostly lays in bed all day sleeping. She has very few social activities. Her pain had increased and she also had a new component of pain over the summer 2016. I ruled out claudication and she also had a another MRI of her lumbar spine and plain x-rays of her hips but I was unable to determine the etiology of the new and increased pain. I requested that she see physical medicine and rehabilitation but she canceled 2 appointments and then slept through one appointment. Because she was charged for her no-show, she now states she cannot afford to go.  I started a taper in August 2016. The reasons for the taper are outlined in the review. I am doing a very slow taper. I am decreasing her quantity by 4 pills every 30 days. This corresponds to one fewer pill per week. My goal is not to stop her opioid but to get back to her pre amputation quantity which is 120.  Marland Kitchen

## 2016-09-12 ENCOUNTER — Other Ambulatory Visit: Payer: Self-pay

## 2016-09-12 NOTE — Patient Outreach (Signed)
[facility]) Care Management  .09/12/16  Jennifer [name] 02/24/1961 [id number]  Outreach completed with patient and s/w [name]. Patient stated that she is on her way to a wedding and cannot talk right now. Requested call back next week.   [place] R. Williemae Muriel, RN, BSN, [place] Management Coordinator [phone]

## 2016-09-20 ENCOUNTER — Other Ambulatory Visit: Payer: Self-pay

## 2016-09-20 NOTE — Patient Outreach (Signed)
[facility] [facility]) Care Management  09/20/16  Jennifer [name] 1961/03/06 [id number]  Attempted to reach patient without success. Left HIPAA compliant voicemail with RNCM contact information and invited callback.  [place] R. Teofilo Lupinacci, RN, BSN, [place] Management Coordinator [phone]

## 2016-09-24 ENCOUNTER — Telehealth: Payer: Self-pay | Admitting: Internal Medicine

## 2016-09-24 NOTE — Telephone Encounter (Signed)
APT. REMINDER CALL, LMTCB °

## 2016-09-25 ENCOUNTER — Ambulatory Visit (INDEPENDENT_AMBULATORY_CARE_PROVIDER_SITE_OTHER): Payer: Medicare Other | Admitting: Internal Medicine

## 2016-09-25 VITALS — BP 142/54 | HR 67 | Temp 98.3°F | Ht 67.0 in | Wt 281.3 lb

## 2016-09-25 DIAGNOSIS — Z79899 Other long term (current) drug therapy: Secondary | ICD-10-CM

## 2016-09-25 DIAGNOSIS — Z23 Encounter for immunization: Secondary | ICD-10-CM

## 2016-09-25 DIAGNOSIS — R05 Cough: Secondary | ICD-10-CM | POA: Diagnosis not present

## 2016-09-25 DIAGNOSIS — J3489 Other specified disorders of nose and nasal sinuses: Secondary | ICD-10-CM

## 2016-09-25 DIAGNOSIS — K219 Gastro-esophageal reflux disease without esophagitis: Secondary | ICD-10-CM

## 2016-09-25 DIAGNOSIS — Z7951 Long term (current) use of inhaled steroids: Secondary | ICD-10-CM

## 2016-09-25 DIAGNOSIS — J449 Chronic obstructive pulmonary disease, unspecified: Secondary | ICD-10-CM

## 2016-09-25 DIAGNOSIS — Z0000 Encounter for general adult medical examination without abnormal findings: Secondary | ICD-10-CM

## 2016-09-25 DIAGNOSIS — R053 Chronic cough: Secondary | ICD-10-CM

## 2016-09-25 DIAGNOSIS — F17210 Nicotine dependence, cigarettes, uncomplicated: Secondary | ICD-10-CM

## 2016-09-25 MED ORDER — IRBESARTAN 300 MG PO TABS: 300.0000 mg | ORAL_TABLET | Freq: Every day | ORAL | 1 refills | Status: DC

## 2016-09-25 MED ORDER — IPRATROPIUM BROMIDE 0.02 % IN SOLN: RESPIRATORY_TRACT | 5 refills | Status: DC

## 2016-09-25 NOTE — Patient Instructions (Addendum)
Ms. Murgo it was nice seeing you today.   STOP taking Benazepril  Start taking Irbesartan 300 mg by mouth once daily  Start using Ipratropium nebulizer solution as instructed   Return to the clinic in 1 month for a follow-up.

## 2016-09-26 ENCOUNTER — Telehealth: Payer: Self-pay | Admitting: Internal Medicine

## 2016-09-26 ENCOUNTER — Other Ambulatory Visit: Payer: Self-pay | Admitting: *Deleted

## 2016-09-26 ENCOUNTER — Other Ambulatory Visit: Payer: Self-pay

## 2016-09-26 MED ORDER — IRBESARTAN 300 MG PO TABS
300.0000 mg | ORAL_TABLET | Freq: Every day | ORAL | 1 refills | Status: DC
Start: 2016-09-26 — End: 2016-11-29

## 2016-09-26 MED ORDER — IPRATROPIUM BROMIDE 0.02 % IN SOLN: RESPIRATORY_TRACT | 5 refills | Status: DC

## 2016-09-26 NOTE — Telephone Encounter (Signed)
Needs to talk to nurse about meds.

## 2016-09-26 NOTE — Patient Outreach (Signed)
[facility]) Care Management  09/26/16  Jennifer [name] [birth year] [id number]  Successful outreach completed with patient. Patient identification verified.  Patient reports that she does not feel well enough to talk today. Stated that her asthma and COPD have been acting up and she is short of breath. Patient stated that she went to see her doctor yesterday and requested RNCM call her again next week. RNCM educated patient about calling 911 if she is in distress and unable to control her shortness of breath with her home medications and she verbalized understanding. Encouraged to call her doctor if she has no improvement after her appointment yesterday.  Home visit scheduled for next week.  [name], RN, BSN, [place] Management Coordinator [phone]

## 2016-09-26 NOTE — Telephone Encounter (Signed)
Need dx code on Ipratropium rx for insurance purpose per [facility]. Also pt wants rxs sent to [facility]. Thanks

## 2016-09-26 NOTE — Telephone Encounter (Signed)
Pt has questions about the 2 medications ordered yesterday - Avapro and Atrovent neb solution. States [facility]. And should have inhaler not neb solution. Talked to Dr Marlowe Sax - stated should be as ordered. [facility] - stated rx for Atrovent needs dx code on the rx and re-send ; Dr Marlowe Sax made awared. Called pt back and informed of the above.

## 2016-09-27 NOTE — Progress Notes (Signed)
CC: Pt is here for a follow up of her cough.   HPI:  Jennifer [name] is a 55 y.o. F with a PMHx of conditions listed below presenting to the clinic for a follow-up of her cough. Please see problem based charting for the status of the patient's chronic medical conditions.   Past Medical History:  Diagnosis Date  . Anginal pain [facility])    '3' of 10 ischemia ruled out 9/9   . 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 Asthma   . CHF (congestive heart failure) ([place])   . 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 Chronic bronchitis ([place])    "I get it alot" (09/28/2013)  . Chronic diastolic heart failure (HCC)    grade 2 per 2D echocardiogram (01/2013)  . Chronic lower back pain   . Chronic osteomyelitis of foot (HCC)    chronic, right secondary to diabetic foot ulcers  . Chronic pain syndrome 12/03/2011   Likely secondary to depression, "fibromyalgia", neuropathy, and obesity. Lumbar MRI 2014 no sig change from prior (2008) : Stable hypertrophic facet disease most notable at L4-5. Stable shallow left foraminal/extraforaminal disc protrusion at L4-5. No direct neural compression.      Marland Kitchen COPD 01/08/2007   PFT's 05/2007 : FEV1/FVC 82, FEV1 64% pred, FEF 25-75% 40% predicted, 16% improvement in FEV1 with bronchodilators.     . Depression   . Diabetic peripheral neuropathy ([place])   . DVT of upper extremity (deep vein thrombosis) ([place]) 03/11/2013   Secondary to PICC line. Right brachial vein, diagnosed on 03/10/2013 Coumadin for 3 months. End date 06/10/2013   . Environmental allergies    Hx: of  . Exertional shortness of breath   . Fatty liver 2003   observed on ultrasound abdomen  . Fibromyalgia   . GERD (gastroesophageal reflux disease)   . Glaucoma   . Hyperlipidemia   . Hyperplastic colon polyp 12/2010   Per colonoscopy (12/2010) - Dr. Deatra Ina  . Hypertension   . 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 Blood cultures  01/26/13 S. Aureus and GBS. Blood cultures Feb 6th, 8th, and 9th and March were negative.Repeat TEE 3/20 negative for vegitations  . 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 2/2 tobacco use, DM, and cont weight bearing on surgical wound and developed gangrene   . Polymicrobial bacterial infection 01/2013   GBS and S. aureus bacteremia // Source likely infected diabetic foot ulcer  . PVD (peripheral vascular disease) with claudication ([place])    Stents to bilateral common iliac arteries (left 2005, right 2008), on chronic plavix  . Rheumatoid arthritis ([place])   . Tobacco abuse   . Type II diabetes mellitus with peripheral circulatory disorders, uncontrolled DX: 1993   Insulin dep. Poor control. Complicated by diabetic foot ulcer and diabetic eye disease.    Marland Kitchen Ulcer of foot, chronic (HCC)    Left. No OM per MRI (01/2013)    Review of Systems: Pertinent positives mentioned in HPI. Remainder of all ROS negative.   Physical Exam:  Vitals:   09/25/16 1108 09/25/16 1227  BP: (!) 142/54   Pulse: 67   Temp: 98.3 F (36.8 C)   TempSrc: Oral   SpO2: (!) 82% 93%  Weight: 281 lb 4.8 oz (127.6 kg)   Height: 5\' 7"  (1.702 m)    Physical Exam  Constitutional:  She is oriented to person, place, and time. She appears well-developed and well-nourished. No distress.  HENT:  Head: Normocephalic and atraumatic.  Eyes: EOM are normal.  Neck: Neck supple. No tracheal deviation present.  Cardiovascular: Normal rate, regular rhythm and intact distal pulses.   Pulmonary/Chest: Effort normal. She has wheezes. She has no rales.  Comfortably speaking in full sentences.   Abdominal: Soft. Bowel sounds are normal. She exhibits no distension. There is no tenderness. There is no guarding.  Neurological: She is alert and oriented to person, place, and time.  Skin: Skin is warm and dry.    Assessment & Plan:   See Encounters Tab for problem  based charting.  Patient discussed with Dr. Angelia Mould

## 2016-09-27 NOTE — Assessment & Plan Note (Signed)
Influenza vaccine administered at this visit. 

## 2016-09-27 NOTE — Assessment & Plan Note (Addendum)
HPI Pt continues to complain of a cough at this visit (>2 month hx). Cough is productive of brown colored sputum. Continues to have rhinorrhea and sinus pressure. Denies having any fevers, chills, or sore throat. States GERD symptoms are well controlled with Zantac and PPI which was started during previous visit. Her COPD medication regimen includes advair, ipratropium, and albuterol nebs + MDI. Reports using albuterol MDI 8 times a day. States she has not used Ipratropium because she was not aware it was on her medication list. Continues to smoke 2-3 cigarettes per day.   A Likely reasons for her chronic cough include smoking, ACEI use, and worsening COPD symptoms as she has not ben using Ipratropium.   P -Discontinue Benazepril -Start Irbesartan 300 mg daily  -Continue Advair and Albuterol neb + prn MDI -Advised patient to pick up Ipratropium from the pharmacy -RTC in 4 weeks

## 2016-09-30 ENCOUNTER — Other Ambulatory Visit: Payer: Self-pay | Admitting: Internal Medicine

## 2016-09-30 ENCOUNTER — Other Ambulatory Visit: Payer: Self-pay

## 2016-09-30 DIAGNOSIS — E1159 Type 2 diabetes mellitus with other circulatory complications: Secondary | ICD-10-CM

## 2016-09-30 DIAGNOSIS — I10 Essential (primary) hypertension: Secondary | ICD-10-CM

## 2016-09-30 NOTE — Patient Outreach (Signed)
[facility]) Care Management  [place]  09/30/2016   Jennifer [name] 08/25/1961 [id number]  Subjective: Home visit completed with patient.  Objective:  Vitals:   09/30/16 1406  BP: (!) (P) 190/80  Pulse: (P) 82  Resp: (!) (P) 22    Encounter Medications:  Outpatient Encounter Prescriptions as of 09/30/2016  Medication Sig  . pantoprazole (PROTONIX) 40 MG tablet Take 1 tablet (40 mg total) by mouth daily.  Marland Kitchen ADVAIR DISKUS 250-50 MCG/DOSE AEPB TAKE 1 INHALATION BY MOUTH TWICE DAILY  . albuterol (PROVENTIL HFA;VENTOLIN HFA) 108 (90 Base) MCG/ACT inhaler Inhale 2 puffs into the lungs every 6 (six) hours as needed for wheezing or shortness of breath.  Marland Kitchen albuterol (PROVENTIL) (2.5 MG/3ML) 0.083% nebulizer solution INHALE THE CONTENTS OF 1 VIAL VIA NEBULIZER EVERY 6 HOURS AS NEEDED FOR WHEEZING  . amLODipine (NORVASC) 10 MG tablet Take 1 tablet (10 mg total) by mouth daily.  Marland Kitchen atenolol (TENORMIN) 100 MG tablet Take 1 tablet (100 mg total) by mouth daily.  Marland Kitchen atorvastatin (LIPITOR) 80 MG tablet Take 1 tablet (80 mg total) by mouth daily.  . baclofen (LIORESAL) 10 MG tablet Take 1 tablet (10 mg total) by mouth 3 (three) times daily as needed for muscle spasms. Reported on 06/27/2016  . benzonatate (TESSALON PERLES) 100 MG capsule Take 1 capsule (100 mg total) by mouth every 6 (six) hours as needed for cough.  . diphenhydrAMINE (BENADRYL) 25 MG tablet Take 2 tablets (50 mg total) by mouth at bedtime as needed.  Marland Kitchen EASY TOUCH PEN NEEDLES 31G X 8 MM MISC USE TO INJECT INSULIN TWICE DAILY  . fluticasone (FLONASE) 50 MCG/ACT nasal spray PLACE 1 SPRAY INTO BOTH NOSTRILS DAILY  . furosemide (LASIX) 40 MG tablet Take 1 tablet (40 mg total) by mouth daily as needed.  . hydrochlorothiazide (HYDRODIURIL) 25 MG tablet Take 1 tablet (25 mg total) by mouth daily.  . Insulin Lispro Prot & Lispro (HUMALOG MIX 75/25 KWIKPEN) (75-25) 100 UNIT/ML Kwikpen INJECT 135 UNITS SUBCUTANEOUSLY  IN THE MORNING BEFORE BREAKFAST & INJECT 85 UNITS SUBCUTANEOUSLY IN THE EVENING BEFORE EVENING MEAL  . ipratropium (ATROVENT) 0.02 % nebulizer solution USE 1 VIAL VIA NEBULIZER EVERY 6 HOURS AS NEEDED FOR WHEEZING  . irbesartan (AVAPRO) 300 MG tablet Take 1 tablet (300 mg total) by mouth daily.  Marland Kitchen lidocaine (LIDODERM) 5 % Place 1 patch onto the skin daily. Remove & Discard patch within 12 hours or as directed by MD (Patient not taking: Reported on 06/05/2016)  . LYRICA 200 MG capsule TAKE 1 CAPSULE BY MOUTH THREE TIMES A DAY  . metFORMIN (GLUCOPHAGE XR) 500 MG 24 hr tablet Take 1 tablet (500 mg total) by mouth daily with breakfast.  . nystatin (MYCOSTATIN) 100000 UNIT/ML suspension Take 5 mLs (500,000 Units total) by mouth 4 (four) times daily.  Marland Kitchen nystatin (MYCOSTATIN) powder APPLY TO AFFECTED AREA THREE TIMES A DAY  . ONETOUCH DELICA LANCETS 99991111 MISC USE TO CHECK BLOOD SUGARS FOUR TIMES A DAY  . ONETOUCH VERIO test strip USE 3-4 TIMES DAILY TO CHECK BLOOD SUGAR  . oxyCODONE-acetaminophen (PERCOCET/ROXICET) 5-325 MG tablet Take 1-2 tablets by mouth every 4 (four) hours as needed for severe pain.  . potassium chloride SA (K-DUR,KLOR-CON) 20 MEQ tablet TAKE 2 TABLETS BY MOUTH DAILY  . PROAIR HFA 108 (90 Base) MCG/ACT inhaler INHALE 2 PUFFS BY MOUTH EVERY 6 HOURS AS NEEDED FOR WHEEZING  . ranitidine (ZANTAC) 150 MG tablet Take 1 tablet (150 mg  total) by mouth 2 (two) times daily. (Patient not taking: Reported on 09/30/2016)  . sertraline (ZOLOFT) 100 MG tablet Take 1 tablet (100 mg total) by mouth daily.  . travoprost, benzalkonium, (TRAVATAN) 0.004 % ophthalmic solution Place 1 drop into both eyes at bedtime.   . traZODone (DESYREL) 100 MG tablet TAKE 1 TABLET BY MOUTH DAILY AT BEDTIME AS NEEDED FOR SLEEP  . varenicline (CHANTIX) 0.5 MG tablet Take one pill once a day for 3 days. Then one pill twice a day for 4 days. Then quit smoking and go to higher dose  . varenicline (CHANTIX) 1 MG tablet Take 1  tablet (1 mg total) by mouth 2 (two) times daily. Take this after you complete the 0.5 mg dose (Patient not taking: Reported on 09/30/2016)   No facility-administered encounter medications on file as of 09/30/2016.     Functional Status:  In your present state of health, do you have any difficulty performing the following activities: 07/25/2016 06/30/2016  Hearing? N Y  Vision? N Y  Difficulty concentrating or making decisions? N N  Walking or climbing stairs? Y Y  Dressing or bathing? Y N  Doing errands, shopping? [name]  Preparing Food and eating ? N -  Using the Toilet? N -  Managing your Medications? N -  Managing your Finances? N -  Housekeeping or managing your Housekeeping? N -  Some recent data might be hidden    Fall/Depression Screening: PHQ 2/9 Scores 08/28/2016 06/30/2016 06/27/2016 06/05/2016 02/28/2016 01/03/2016 10/18/2015  PHQ - 2 Score 3 6 6 1 4 1 3   PHQ- 9 Score 11 13 13  - 9 - 13    Assessment:  Home visit completed, but patient busy while nurse present. She was preparing lunch, stating that she has not yet eaten today. Patient cooking bacon and eggs. Patient reports that she has not been feeling well. Still complains of cough and shortness of breath related to her COPD. Patient stated that she stopped taking her Chantix and is still smoking about a 1/2 pack per day. She stated that she is using her nebulizer and it helps a little, but she just cannot get over this. RNCM educated regarding importance of quitting smoking and she verbalizes understanding. Patient reports a cough with brown sputum and still has some sinus pressure. Stated that she has had some chills, but does not know if she has a fever because she does not have a thermometer to check it with.   Patient's blood pressure noted as high today, but she reports that she has not taken any of her medications today. She reported that she has not checked her blood sugar today, but it was 198 yesterday fasting. Patient's initial  SpO2 was 80%, but after a few deep breaths, able to get up to 90%. RNCM encouraged patient to take all of her medications and use her nebulizer after eating her lunch. Patient verbalized understanding and said she would take them. Patient was preparing food for almost all of visit and was still eating when RNCM left.  Patient reports that her blood pressures have been high. She continues to state that she has had a lot of personal stress. She reports that both sons are currently living with her and she is supporting them. Patient currently has a house full of company, with 3 people sleeping in the living room floor and a female gentleman in her bedroom. She reports that she is the only one paying bills and her sons' jobs are  being difficult with them.   Patient continues to complain of generalized pain all over. She stated that Dr. [name] has been reducing how much pain medicine she is prescribing her and she is not why. She reports that her prescriptions have been cut from 366 tablets per month to 244 tablets. She reports history of taking 2 tablets every 4 hours. Per her prescription, it is written to take 1-2 tablets every 4 hours as needed for pain. She reports that her pain medications would never take her pain away, but they did help ease her pain. She reports that she is not sleeping much, often due to pain. She stated that now she is waiting to take her pain medication when the pain level gets really bad or she is crying and then she takes a pill. She stated that her pain moves from place to place and today is affecting her left elbow. Reports it is "burning" and rates 10/10. Patient is using both arms without difficulty while cooking. RNCM educated patient regarding the decreased use of narcotic pain medications and encouraged not to wait until she is tearful before taking her medication. Encouraged that instead of waiting, or taking 2 tablets as she has been doing, to take 1 tablet for her pain. Patient  seems frustrated when talking about it and does not understand why she has to reduce her pain medications with all of her reasons for having pain, such as fibromyalgia and arthritis. She reports that her doctor wants her to go to pain management, but it requires a $65 and she cannot afford that. She stated she will not be going to pain management.   Patient reported that she got dentures in 2009 and only wore them about 5-10 minutes. She reports that when she got them, they hurt on the top and bottom and she tried to talk about it with the dentist at the time, but they encouraged her to wear them for a while first to see where they fit. Patient reported as soon as she left the office, she took them out and has not worn them since. She stated that initially, her gums have been really hard and she has not had a lot of problems, but they are more tender now and it hurts to eat toast. She stated that she has not followed up with a dentist since then because she does not have dental insurance. RNCM will send referral to social worker for assistance with dental resources.  Plan: Will follow up in couple of weeks. RNCM will send referral to SW for dental resources. Patient encouraged to call with any questions or concerns.  [place] R. Bradden Tadros, RN, BSN, [place] Management Coordinator [phone]

## 2016-09-30 NOTE — Progress Notes (Signed)
Internal Medicine Clinic Attending  Case discussed with Dr. Rathoreat the time of the visit. We reviewed the resident's history and exam and pertinent patient test results. I agree with the assessment, diagnosis, and plan of care documented in the resident's note.  

## 2016-10-01 NOTE — Telephone Encounter (Signed)
All other refills had ben filled in July for one yr supply

## 2016-10-25 ENCOUNTER — Other Ambulatory Visit: Payer: Self-pay | Admitting: Internal Medicine

## 2016-10-31 ENCOUNTER — Other Ambulatory Visit: Payer: Self-pay

## 2016-10-31 NOTE — Patient Outreach (Signed)
[facility] [facility]) Care Management  10/31/16  VELORA ARPIN Jan 18, 1961 [id number]  Attempted to contact patient without success. Left HIPAA compliant voicemail with RNCM contact information and invited callback.  [place] R. Rip Hawes, RN, BSN, [place] Management Coordinator [phone]

## 2016-11-03 ENCOUNTER — Telehealth: Payer: Self-pay | Admitting: Internal Medicine

## 2016-11-03 NOTE — Telephone Encounter (Signed)
Called pt informed her of meds Also scheduled an appt for [facility] for tremors and an abscess in her mouth

## 2016-11-03 NOTE — Telephone Encounter (Signed)
I checked and these have been [facility] and went out today

## 2016-11-03 NOTE — Telephone Encounter (Signed)
hydrochlorothiazide (HYDRODIURIL) 25 MG tablet furosemide (LASIX) 40 MG tablet baclofen (LIORESAL) 10 MG tablet [facility]

## 2016-11-04 ENCOUNTER — Other Ambulatory Visit: Payer: Self-pay | Admitting: Internal Medicine

## 2016-11-04 DIAGNOSIS — E1159 Type 2 diabetes mellitus with other circulatory complications: Secondary | ICD-10-CM

## 2016-11-04 DIAGNOSIS — I10 Essential (primary) hypertension: Principal | ICD-10-CM

## 2016-11-04 DIAGNOSIS — I152 Hypertension secondary to endocrine disorders: Secondary | ICD-10-CM

## 2016-11-06 ENCOUNTER — Ambulatory Visit: Payer: Medicare Other | Admitting: Internal Medicine

## 2016-11-07 ENCOUNTER — Other Ambulatory Visit: Payer: Self-pay

## 2016-11-07 ENCOUNTER — Encounter: Payer: Self-pay | Admitting: Internal Medicine

## 2016-11-07 ENCOUNTER — Ambulatory Visit (INDEPENDENT_AMBULATORY_CARE_PROVIDER_SITE_OTHER): Payer: Medicare Other | Admitting: Internal Medicine

## 2016-11-07 VITALS — BP 109/59 | HR 77 | Temp 98.1°F | Ht 67.0 in | Wt 281.9 lb

## 2016-11-07 DIAGNOSIS — E08319 Diabetes mellitus due to underlying condition with unspecified diabetic retinopathy without macular edema: Secondary | ICD-10-CM | POA: Diagnosis not present

## 2016-11-07 DIAGNOSIS — R253 Fasciculation: Secondary | ICD-10-CM

## 2016-11-07 DIAGNOSIS — G250 Essential tremor: Secondary | ICD-10-CM

## 2016-11-07 DIAGNOSIS — K1370 Unspecified lesions of oral mucosa: Secondary | ICD-10-CM

## 2016-11-07 DIAGNOSIS — F17210 Nicotine dependence, cigarettes, uncomplicated: Secondary | ICD-10-CM | POA: Diagnosis not present

## 2016-11-07 DIAGNOSIS — Z794 Long term (current) use of insulin: Secondary | ICD-10-CM | POA: Diagnosis not present

## 2016-11-07 DIAGNOSIS — G253 Myoclonus: Secondary | ICD-10-CM

## 2016-11-07 DIAGNOSIS — E1121 Type 2 diabetes mellitus with diabetic nephropathy: Secondary | ICD-10-CM | POA: Diagnosis not present

## 2016-11-07 DIAGNOSIS — E0865 Diabetes mellitus due to underlying condition with hyperglycemia: Secondary | ICD-10-CM | POA: Diagnosis not present

## 2016-11-07 DIAGNOSIS — Z820 Family history of epilepsy and other diseases of the nervous system: Secondary | ICD-10-CM

## 2016-11-07 NOTE — Patient Outreach (Signed)
[place] [facility]) Care Management  11/07/16  Jennifer [name] 1961-10-25 [id number]  Attempted to reach patient without success. Left HIPAA compliant voicemail with RNCM contact info.  [name], RN, BSN, [place] Management Coordinator [phone]

## 2016-11-07 NOTE — Patient Instructions (Signed)
For what's going on in your mouth, please keep an eye on it. If it starts to get bigger, we need to get you over to the ENT doctors to look at. In the mean time, let's work on cutting back on your smoking.  For the tremors, we will give you a call about your blood work if there's something we can treat.

## 2016-11-07 NOTE — Progress Notes (Signed)
CC: mouth abscess  HPI:  Jennifer [name] is a 55 y.o. female who presents today for mouth abscess. Please see assessment & plan for status of chronic medical problems.  Past Medical History:  Diagnosis Date  . Anginal pain [facility])    '3' of 10 ischemia ruled out 9/9   . Arthritis of lumbar spine ([place])   . Asthma   . CHF (congestive heart failure) ([place])   . 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 Chronic bronchitis ([place])    "I get it alot" (09/28/2013)  . Chronic diastolic heart failure (HCC)    grade 2 per 2D echocardiogram (01/2013)  . Chronic lower back pain   . Chronic osteomyelitis of foot (HCC)    chronic, right secondary to diabetic foot ulcers  . Chronic pain syndrome 12/03/2011   Likely secondary to depression, "fibromyalgia", neuropathy, and obesity. Lumbar MRI 2014 no sig change from prior (2008) : Stable hypertrophic facet disease most notable at L4-5. Stable shallow left foraminal/extraforaminal disc protrusion at L4-5. No direct neural compression.      Marland Kitchen COPD 01/08/2007   PFT's 05/2007 : FEV1/FVC 82, FEV1 64% pred, FEF 25-75% 40% predicted, 16% improvement in FEV1 with bronchodilators.     . Depression   . Diabetic peripheral neuropathy ([place])   . DVT of upper extremity (deep vein thrombosis) ([place]) 03/11/2013   Secondary to PICC line. Right brachial vein, diagnosed on 03/10/2013 Coumadin for 3 months. End date 06/10/2013   . Environmental allergies    Hx: of  . Exertional shortness of breath   . Fatty liver 2003   observed on ultrasound abdomen  . Fibromyalgia   . GERD (gastroesophageal reflux disease)   . Glaucoma   . Hyperlipidemia   . Hyperplastic colon polyp 12/2010   Per colonoscopy (12/2010) - Dr. Deatra Ina  . Hypertension   . 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 Blood cultures 01/26/13 S. Aureus and GBS. Blood cultures Feb 6th, 8th, and 9th and March were negative.Repeat TEE 3/20  negative for vegitations  . 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 2/2 tobacco use, DM, and cont weight bearing on surgical wound and developed gangrene   . Polymicrobial bacterial infection 01/2013   GBS and S. aureus bacteremia // Source likely infected diabetic foot ulcer  . PVD (peripheral vascular disease) with claudication ([place])    Stents to bilateral common iliac arteries (left 2005, right 2008), on chronic plavix  . Rheumatoid arthritis ([place])   . Tobacco abuse   . Type II diabetes mellitus with peripheral circulatory disorders, uncontrolled DX: 1993   Insulin dep. Poor control. Complicated by diabetic foot ulcer and diabetic eye disease.    Marland Kitchen Ulcer of foot, chronic (HCC)    Left. No OM per MRI (01/2013)    Review of Systems:  Please see each problem below for a pertinent review of systems.  Physical Exam:  Vitals:   11/07/16 1037  BP: (!) 109/59  Pulse: 77  Temp: 98.1 F (36.7 C)  TempSrc: Oral  SpO2: 90%  Weight: 281 lb 14.4 oz (127.9 kg)  Height: 5\' 7"  (1.702 m)   Physical Exam  Constitutional: She is oriented to person, place, and time. No distress.  Tired appearing, intermittently falling asleep throughout the interview  HENT:  Head: Normocephalic and atraumatic.  0.5 cm papule located on the  left wall of the oropharynx that is non-tender and non-fluctuant.   Eyes: Conjunctivae are normal. No scleral icterus.  Cardiovascular: Normal rate and regular rhythm.   Pulmonary/Chest: Effort normal. No respiratory distress.  Neurological: She is alert and oriented to person, place, and time. She exhibits normal muscle tone (No spastic rigidity noted with repetitive flexion and extension of the upper extremities.). Coordination (Delayed motions with finger to nose and rapid alternating movements.) normal.  Skin: Skin is warm and dry. She is not diaphoretic.     Assessment & Plan:   See Encounters  Tab for problem based charting.  Patient discussed with Dr. Daryll Drown

## 2016-11-08 LAB — HIV ANTIBODY (ROUTINE TESTING W REFLEX): HIV Screen 4th Generation wRfx: NONREACTIVE

## 2016-11-10 ENCOUNTER — Encounter: Payer: Self-pay | Admitting: Internal Medicine

## 2016-11-10 NOTE — Assessment & Plan Note (Signed)
Assessment She has not had an A1c checked in several months and is due to see her PCP in 2 months.  Plan -Check A1c today  ADDENDUM 11/10/2016  8:00 AM:  A1c 8.5, stable from a 8.7 at 5 months ago.

## 2016-11-10 NOTE — Assessment & Plan Note (Addendum)
Assessment She complains to me today of having "jerks" which make it hard for her to text on her cell phone. She has these intermittently throughout the day and cannot think of any particular activity which seems to bring them on. Her mother passed away a year and a half ago from Parkinson's disease which was diagnosed 5 years earlier. She denies a prior history of seizure disorder.  The differential for myoclonus is quite long as noted by her PCP several months ago, and these symptoms may be secondary to medications she is currently taking. Most recent neuroimaging head CT 01/26/16 without acute abnormalities though brain MRI 08/25/2007 did show nonspecific subcortical white matter changes supratentorially suggestive of either small vessel microvascular disease from long-standing hypertension and diabetes or a demyelinating process. We can start with basic lab work and offer referral to Neurology if negative.   Plan -Check CMET, CBC, Mg, TSH, RPR -Consider neuroimaging and referral to Neurology if workup reassuring  ADDENDUM 11/10/2016  7:44 AM:  CMET with mildly elevated bicarbonate 31, mildly decreased albumin 3.3. Calcium corrects to 8.9 which is within normal limits. No renal or hepatic dysfunction noted.  TSH, magnesium, RPR, HIV reassuring.  ADDENDUM 11/12/2016  10:51 AM:  I spoke to the patient by phone and recommended referral to neurology. She was hesitant due to the copay, and I told her I would route the message to Dr. [name]. She appreciated my efforts and thanked me for my time.

## 2016-11-10 NOTE — Assessment & Plan Note (Signed)
Assessment For the last couple weeks, she is noted acute onset of "mouth abscess." She is at her prior history of these in the past for which she required bowel surgery though she does not recall the exact details of it. She has not been to a dentist for quite some time as she no longer has teeth. She continues to smoke daily..  Her exam does not appear consistent with a mouth abscess given the nonfluctuant, nontender nature of it. I do see a papule alongside the left cheek wall. Given her ongoing tobacco use, I would be more concerned about ruling out a malignant lesion.  Plan -Recommend she follow-up with ENT for further evaluation. She expressed to me she does not have the money to afford the co-pay. I will check with our financial counselor to see if she is eligible for supplementary funds. -I recommended we watch this lesion closely and that she let us know immediately if she begins to notice any changes, including but not limited to size or texture.

## 2016-11-11 ENCOUNTER — Telehealth: Payer: Self-pay

## 2016-11-11 LAB — CMP14 + ANION GAP
ALT: 11 IU/L (ref 0–32)
AST: 12 IU/L (ref 0–40)
Albumin/Globulin Ratio: 1.2 (ref 1.2–2.2)
Albumin: 3.3 g/dL — ABNORMAL LOW (ref 3.5–5.5)
Alkaline Phosphatase: 78 IU/L (ref 39–117)
Anion Gap: 9 mmol/L — ABNORMAL LOW (ref 10.0–18.0)
BUN/Creatinine Ratio: 16 (ref 9–23)
BUN: 15 mg/dL (ref 6–24)
Bilirubin Total: 0.3 mg/dL (ref 0.0–1.2)
CO2: 31 mmol/L — ABNORMAL HIGH (ref 18–29)
Calcium: 8.3 mg/dL — ABNORMAL LOW (ref 8.7–10.2)
Chloride: 104 mmol/L (ref 96–106)
Creatinine, Ser: 0.92 mg/dL (ref 0.57–1.00)
GFR calc Af Amer: 80 mL/min/{1.73_m2} (ref >59)
GFR calc non Af Amer: 69 mL/min/{1.73_m2} (ref >59)
Globulin, Total: 2.7 g/dL (ref 1.5–4.5)
Glucose: 126 mg/dL — ABNORMAL HIGH (ref 65–99)
Potassium: 4.3 mmol/L (ref 3.5–5.2)
Sodium: 144 mmol/L (ref 134–144)
Total Protein: 6 g/dL (ref 6.0–8.5)

## 2016-11-11 LAB — MAGNESIUM: Magnesium: 1.9 mg/dL (ref 1.6–2.3)

## 2016-11-11 LAB — HEMOGLOBIN A1C
Est. average glucose Bld gHb Est-mCnc: 197 mg/dL
Hgb A1c MFr Bld: 8.5 % — ABNORMAL HIGH (ref 4.8–5.6)

## 2016-11-11 LAB — RPR: RPR Ser Ql: NONREACTIVE

## 2016-11-11 LAB — TSH: TSH: 1.12 u[IU]/mL (ref 0.450–4.500)

## 2016-11-12 ENCOUNTER — Encounter: Payer: Self-pay | Admitting: Internal Medicine

## 2016-11-12 NOTE — Telephone Encounter (Signed)
She has RF on everything but albuterol.

## 2016-11-14 NOTE — Progress Notes (Signed)
Internal Medicine Clinic Attending  Case discussed with Dr. Patel,Rushil soon after the resident saw the patient.  We reviewed the resident's history and exam and pertinent patient test results.  I agree with the assessment, diagnosis, and plan of care documented in the resident's note. 

## 2016-11-26 ENCOUNTER — Other Ambulatory Visit: Payer: Self-pay | Admitting: Internal Medicine

## 2016-11-27 ENCOUNTER — Other Ambulatory Visit: Payer: Self-pay

## 2016-11-27 DIAGNOSIS — I10 Essential (primary) hypertension: Principal | ICD-10-CM

## 2016-11-27 DIAGNOSIS — E1159 Type 2 diabetes mellitus with other circulatory complications: Secondary | ICD-10-CM

## 2016-11-27 NOTE — Telephone Encounter (Signed)
atenolol (TENORMIN) 100 MG tablet furosemide (LASIX) 40 MG tablet baclofen (LIORESAL) 10 MG tablet traZODone (DESYREL) 100 MG tablet hydrochlorothiazide (HYDRODIURIL) 25 MG tablet, Refill request.

## 2016-11-27 NOTE — Telephone Encounter (Signed)
Call made to [facility] to confirm refill status of meds that pt was requesting.  The hctz, lasix, and baclofen were all mailed out on Nov 13 for a 90 day supply and they all have remaining refills left.  She has refills on file for the [place] will mail out tomorrow.  She also has refills on the atenolol, but the pharmacy is out of 100 mg tabs and will be providing pt with 25mg  tabs to take 2 tabs (100mg  total) daily-pt aware.  No additional refills needed at this time.Jennifer [name], [name]12/7/20173:42 PM

## 2016-11-27 NOTE — Patient Outreach (Signed)
Telephone assessment: Second attempt to reach patient today for follow up on new referral. No answer.  Plan: Will await a return call, letter mailed today. If no response will close in 10 days.  Tomasa Rand, RN, BSN, CEN [facility] [employer] [phone]

## 2016-11-27 NOTE — Patient Outreach (Signed)
Telephone assessment: New referral for this case manager as patient was reassigned.  In chart review:  Attempted contact on 10/31/2016 ---unsuccessful                           Attempted contact on 11/07/2016----Unsuccessful                           Attempted contact today by myself  11/27/2016   Unsuccessful. Will mail letter and close if no response in 10 days.   Tomasa Rand, RN, BSN, CEN [facility] [employer] [phone]

## 2016-11-28 ENCOUNTER — Ambulatory Visit (HOSPITAL_COMMUNITY)
Admission: RE | Admit: 2016-11-28 | Discharge: 2016-11-28 | Disposition: A | Discharge status: Home / Self Care | Payer: Medicare Other | Source: Ambulatory Visit | Attending: Student in an Organized Health Care Education/Training Program | Admitting: Student in an Organized Health Care Education/Training Program

## 2016-11-28 ENCOUNTER — Observation Stay (HOSPITAL_COMMUNITY)
Admission: AD | Admit: 2016-11-28 | Discharge: 2016-11-29 | Disposition: A | Discharge status: Home / Self Care | Payer: Medicare Other | Source: Ambulatory Visit | Attending: Internal Medicine | Admitting: Internal Medicine

## 2016-11-28 ENCOUNTER — Encounter: Payer: Self-pay | Admitting: Internal Medicine

## 2016-11-28 ENCOUNTER — Ambulatory Visit (INDEPENDENT_AMBULATORY_CARE_PROVIDER_SITE_OTHER): Payer: Medicare Other | Admitting: Internal Medicine

## 2016-11-28 VITALS — BP 112/60 | HR 69 | Temp 97.9°F | Wt 275.4 lb

## 2016-11-28 DIAGNOSIS — F17210 Nicotine dependence, cigarettes, uncomplicated: Secondary | ICD-10-CM | POA: Diagnosis not present

## 2016-11-28 DIAGNOSIS — J441 Chronic obstructive pulmonary disease with (acute) exacerbation: Principal | ICD-10-CM | POA: Insufficient documentation

## 2016-11-28 DIAGNOSIS — Z7984 Long term (current) use of oral hypoglycemic drugs: Secondary | ICD-10-CM | POA: Insufficient documentation

## 2016-11-28 DIAGNOSIS — Z8249 Family history of ischemic heart disease and other diseases of the circulatory system: Secondary | ICD-10-CM

## 2016-11-28 DIAGNOSIS — Z8379 Family history of other diseases of the digestive system: Secondary | ICD-10-CM

## 2016-11-28 DIAGNOSIS — Z794 Long term (current) use of insulin: Secondary | ICD-10-CM

## 2016-11-28 DIAGNOSIS — Z79891 Long term (current) use of opiate analgesic: Secondary | ICD-10-CM

## 2016-11-28 DIAGNOSIS — R0602 Shortness of breath: Secondary | ICD-10-CM | POA: Diagnosis present

## 2016-11-28 DIAGNOSIS — Z79899 Other long term (current) drug therapy: Secondary | ICD-10-CM | POA: Diagnosis not present

## 2016-11-28 DIAGNOSIS — S91104S Unspecified open wound of right lesser toe(s) without damage to nail, sequela: Secondary | ICD-10-CM

## 2016-11-28 DIAGNOSIS — Z7951 Long term (current) use of inhaled steroids: Secondary | ICD-10-CM

## 2016-11-28 DIAGNOSIS — X58XXXS Exposure to other specified factors, sequela: Secondary | ICD-10-CM | POA: Diagnosis not present

## 2016-11-28 DIAGNOSIS — Z885 Allergy status to narcotic agent status: Secondary | ICD-10-CM

## 2016-11-28 DIAGNOSIS — I110 Hypertensive heart disease with heart failure: Secondary | ICD-10-CM | POA: Insufficient documentation

## 2016-11-28 DIAGNOSIS — E1140 Type 2 diabetes mellitus with diabetic neuropathy, unspecified: Secondary | ICD-10-CM

## 2016-11-28 DIAGNOSIS — E1142 Type 2 diabetes mellitus with diabetic polyneuropathy: Secondary | ICD-10-CM | POA: Diagnosis not present

## 2016-11-28 DIAGNOSIS — I5032 Chronic diastolic (congestive) heart failure: Secondary | ICD-10-CM | POA: Insufficient documentation

## 2016-11-28 DIAGNOSIS — L97511 Non-pressure chronic ulcer of other part of right foot limited to breakdown of skin: Secondary | ICD-10-CM

## 2016-11-28 DIAGNOSIS — F329 Major depressive disorder, single episode, unspecified: Secondary | ICD-10-CM

## 2016-11-28 DIAGNOSIS — Z825 Family history of asthma and other chronic lower respiratory diseases: Secondary | ICD-10-CM

## 2016-11-28 DIAGNOSIS — Z833 Family history of diabetes mellitus: Secondary | ICD-10-CM

## 2016-11-28 DIAGNOSIS — R05 Cough: Secondary | ICD-10-CM | POA: Diagnosis not present

## 2016-11-28 DIAGNOSIS — Z89512 Acquired absence of left leg below knee: Secondary | ICD-10-CM

## 2016-11-28 DIAGNOSIS — J449 Chronic obstructive pulmonary disease, unspecified: Secondary | ICD-10-CM

## 2016-11-28 DIAGNOSIS — Z888 Allergy status to other drugs, medicaments and biological substances status: Secondary | ICD-10-CM

## 2016-11-28 DIAGNOSIS — L97519 Non-pressure chronic ulcer of other part of right foot with unspecified severity: Secondary | ICD-10-CM | POA: Diagnosis not present

## 2016-11-28 DIAGNOSIS — Z91041 Radiographic dye allergy status: Secondary | ICD-10-CM

## 2016-11-28 DIAGNOSIS — G8929 Other chronic pain: Secondary | ICD-10-CM

## 2016-11-28 DIAGNOSIS — K219 Gastro-esophageal reflux disease without esophagitis: Secondary | ICD-10-CM

## 2016-11-28 DIAGNOSIS — E785 Hyperlipidemia, unspecified: Secondary | ICD-10-CM

## 2016-11-28 LAB — CBC
HCT: 45 % (ref 36.0–46.0)
Hemoglobin: 14.2 g/dL (ref 12.0–15.0)
MCH: 31 pg (ref 26.0–34.0)
MCHC: 31.6 g/dL (ref 30.0–36.0)
MCV: 98.3 fL (ref 78.0–100.0)
Platelets: 140 10*3/uL — ABNORMAL LOW (ref 150–400)
RBC: 4.58 MIL/uL (ref 3.87–5.11)
RDW: 14.7 % (ref 11.5–15.5)
WBC: 7.7 10*3/uL (ref 4.0–10.5)

## 2016-11-28 LAB — GLUCOSE, CAPILLARY
Glucose-Capillary: 193 mg/dL — ABNORMAL HIGH (ref 65–99)
Glucose-Capillary: 137 mg/dL — ABNORMAL HIGH (ref 65–99)
Glucose-Capillary: 92 mg/dL (ref 65–99)

## 2016-11-28 LAB — INFLUENZA PANEL BY PCR (TYPE A & B)
Influenza A By PCR: NEGATIVE
Influenza B By PCR: NEGATIVE

## 2016-11-28 LAB — BASIC METABOLIC PANEL
Anion gap: 9 (ref 5–15)
BUN: 8 mg/dL (ref 6–20)
CO2: 29 mmol/L (ref 22–32)
Calcium: 8.7 mg/dL — ABNORMAL LOW (ref 8.9–10.3)
Chloride: 104 mmol/L (ref 101–111)
Creatinine, Ser: 1.03 mg/dL — ABNORMAL HIGH (ref 0.44–1.00)
GFR calc Af Amer: >60 mL/min (ref >60)
GFR calc non Af Amer: 59 mL/min — ABNORMAL LOW (ref >60)
Glucose, Bld: 99 mg/dL (ref 65–99)
Potassium: 4.2 mmol/L (ref 3.5–5.1)
Sodium: 142 mmol/L (ref 135–145)

## 2016-11-28 MED ORDER — INSULIN ASPART PROT & ASPART (70-30 MIX) 100 UNIT/ML ~~LOC~~ SUSP
140.0000 [IU] | Freq: Every day | SUBCUTANEOUS | Status: DC
  Administered 2016-11-29: 140 [IU] via SUBCUTANEOUS
  Filled 2016-11-28: qty 10

## 2016-11-28 MED ORDER — MOMETASONE FURO-FORMOTEROL FUM 200-5 MCG/ACT IN AERO
2.0000 | INHALATION_SPRAY | Freq: Two times a day (BID) | RESPIRATORY_TRACT | Status: DC
  Administered 2016-11-28 – 2016-11-29 (×2): 2 via RESPIRATORY_TRACT
  Filled 2016-11-28: qty 8.8

## 2016-11-28 MED ORDER — AMLODIPINE BESYLATE 10 MG PO TABS
10.0000 mg | ORAL_TABLET | Freq: Every day | ORAL | Status: DC
  Administered 2016-11-29: 10 mg via ORAL
  Filled 2016-11-28: qty 1

## 2016-11-28 MED ORDER — PRO-STAT SUGAR FREE PO LIQD
30.0000 mL | Freq: Two times a day (BID) | ORAL | Status: DC
  Administered 2016-11-29: 30 mL via ORAL
  Filled 2016-11-28: qty 30

## 2016-11-28 MED ORDER — DIPHENHYDRAMINE HCL 25 MG PO CAPS: 50.0000 mg | ORAL_CAPSULE | Freq: Every evening | ORAL | Status: DC | PRN

## 2016-11-28 MED ORDER — PREDNISONE 20 MG PO TABS
40.0000 mg | ORAL_TABLET | Freq: Every day | ORAL | Status: DC
  Administered 2016-11-29: 40 mg via ORAL
  Filled 2016-11-28: qty 2

## 2016-11-28 MED ORDER — HYDROCHLOROTHIAZIDE 25 MG PO TABS
25.0000 mg | ORAL_TABLET | Freq: Every day | ORAL | Status: DC
  Administered 2016-11-28 – 2016-11-29 (×2): 25 mg via ORAL
  Filled 2016-11-28 (×2): qty 1

## 2016-11-28 MED ORDER — METHYLPREDNISOLONE SODIUM SUCC 125 MG IJ SOLR
80.0000 mg | Freq: Once | INTRAMUSCULAR | Status: AC
  Administered 2016-11-28: 80 mg via INTRAVENOUS
  Filled 2016-11-28: qty 2

## 2016-11-28 MED ORDER — INSULIN ASPART 100 UNIT/ML ~~LOC~~ SOLN
0.0000 [IU] | Freq: Three times a day (TID) | SUBCUTANEOUS | Status: DC
  Administered 2016-11-28: 2 [IU] via SUBCUTANEOUS
  Administered 2016-11-29: 11 [IU] via SUBCUTANEOUS
  Administered 2016-11-29: 5 [IU] via SUBCUTANEOUS

## 2016-11-28 MED ORDER — AZITHROMYCIN 500 MG PO TABS
500.0000 mg | ORAL_TABLET | Freq: Once | ORAL | Status: AC
  Administered 2016-11-28: 500 mg via ORAL
  Filled 2016-11-28: qty 1

## 2016-11-28 MED ORDER — INSULIN LISPRO PROT & LISPRO (75-25 MIX) 100 UNIT/ML KWIKPEN: 85.0000 [IU] | PEN_INJECTOR | SUBCUTANEOUS | Status: DC

## 2016-11-28 MED ORDER — ENOXAPARIN SODIUM 40 MG/0.4ML ~~LOC~~ SOLN
40.0000 mg | SUBCUTANEOUS | Status: DC
  Administered 2016-11-28: 40 mg via SUBCUTANEOUS
  Filled 2016-11-28: qty 0.4

## 2016-11-28 MED ORDER — TRAZODONE HCL 100 MG PO TABS: 100.0000 mg | ORAL_TABLET | Freq: Every evening | ORAL | Status: DC | PRN

## 2016-11-28 MED ORDER — PREGABALIN 100 MG PO CAPS
200.0000 mg | ORAL_CAPSULE | Freq: Three times a day (TID) | ORAL | Status: DC
  Administered 2016-11-28 – 2016-11-29 (×3): 200 mg via ORAL
  Filled 2016-11-28 (×3): qty 2

## 2016-11-28 MED ORDER — ROSUVASTATIN CALCIUM 20 MG PO TABS
20.0000 mg | ORAL_TABLET | Freq: Every day | ORAL | Status: DC
  Administered 2016-11-28: 20 mg via ORAL
  Filled 2016-11-28: qty 1

## 2016-11-28 MED ORDER — FUROSEMIDE 80 MG PO TABS: 80.0000 mg | ORAL_TABLET | Freq: Every day | ORAL | Status: DC | PRN

## 2016-11-28 MED ORDER — OXYCODONE-ACETAMINOPHEN 5-325 MG PO TABS
1.0000 | ORAL_TABLET | ORAL | Status: DC | PRN
  Administered 2016-11-28 – 2016-11-29 (×4): 2 via ORAL
  Filled 2016-11-28 (×4): qty 2

## 2016-11-28 MED ORDER — BENAZEPRIL HCL 40 MG PO TABS
40.0000 mg | ORAL_TABLET | Freq: Every day | ORAL | Status: DC
  Administered 2016-11-29: 40 mg via ORAL
  Filled 2016-11-28: qty 1

## 2016-11-28 MED ORDER — FLUTICASONE PROPIONATE 50 MCG/ACT NA SUSP
1.0000 | Freq: Every day | NASAL | Status: DC
  Administered 2016-11-28 – 2016-11-29 (×2): 1 via NASAL
  Filled 2016-11-28: qty 16

## 2016-11-28 MED ORDER — PANTOPRAZOLE SODIUM 40 MG PO TBEC
40.0000 mg | DELAYED_RELEASE_TABLET | Freq: Every day | ORAL | Status: DC
  Administered 2016-11-29: 40 mg via ORAL
  Filled 2016-11-28: qty 1

## 2016-11-28 MED ORDER — BACLOFEN 10 MG PO TABS
10.0000 mg | ORAL_TABLET | Freq: Three times a day (TID) | ORAL | Status: DC | PRN
  Administered 2016-11-29: 10 mg via ORAL
  Filled 2016-11-28: qty 1

## 2016-11-28 MED ORDER — ATENOLOL 50 MG PO TABS
100.0000 mg | ORAL_TABLET | Freq: Every day | ORAL | Status: DC
  Administered 2016-11-29: 100 mg via ORAL
  Filled 2016-11-28: qty 2

## 2016-11-28 MED ORDER — IPRATROPIUM-ALBUTEROL 0.5-2.5 (3) MG/3ML IN SOLN
3.0000 mL | Freq: Four times a day (QID) | RESPIRATORY_TRACT | Status: DC
  Administered 2016-11-28 – 2016-11-29 (×4): 3 mL via RESPIRATORY_TRACT
  Filled 2016-11-28 (×4): qty 3

## 2016-11-28 MED ORDER — NICOTINE 14 MG/24HR TD PT24
14.0000 mg | MEDICATED_PATCH | Freq: Every day | TRANSDERMAL | Status: DC
  Filled 2016-11-28: qty 1

## 2016-11-28 MED ORDER — SERTRALINE HCL 100 MG PO TABS
100.0000 mg | ORAL_TABLET | Freq: Every day | ORAL | Status: DC
  Administered 2016-11-29: 100 mg via ORAL
  Filled 2016-11-28: qty 1

## 2016-11-28 MED ORDER — ALBUTEROL SULFATE (2.5 MG/3ML) 0.083% IN NEBU: 2.5000 mg | INHALATION_SOLUTION | Freq: Four times a day (QID) | RESPIRATORY_TRACT | Status: DC | PRN

## 2016-11-28 MED ORDER — INSULIN ASPART PROT & ASPART (70-30 MIX) 100 UNIT/ML ~~LOC~~ SUSP
85.0000 [IU] | Freq: Every day | SUBCUTANEOUS | Status: DC
  Administered 2016-11-28: 85 [IU] via SUBCUTANEOUS
  Filled 2016-11-28: qty 10

## 2016-11-28 MED ORDER — BENZONATATE 100 MG PO CAPS
100.0000 mg | ORAL_CAPSULE | Freq: Four times a day (QID) | ORAL | Status: DC | PRN
  Administered 2016-11-28 – 2016-11-29 (×3): 100 mg via ORAL
  Filled 2016-11-28 (×3): qty 1

## 2016-11-28 NOTE — Progress Notes (Signed)
Responded to consult, discussed/completed advanced directive form with/for pt. Pt busy w/ pharm check, arrival of lunch, blood test. Will return w/ notary and volunteers to execute form when possible. Chaplain available for f/u.   11/28/16 1400  Clinical Encounter Type  Visited With Patient  Visit Type Initial;Psychological support;Spiritual support;Social support  Referral From Nurse  Spiritual Encounters  Spiritual Needs Brochure;Other (Comment)  Stress Factors  Patient Stress Factors Health changes;Loss of control (Complete advanced directive)  Gerrit Heck, Chaplain

## 2016-11-28 NOTE — Progress Notes (Signed)
Report called to [facility] RN [address] 30 transported  To room assisted to bed call bell with in reach educated on  room understands how to use call bell for help. No acute distress noted.

## 2016-11-28 NOTE — Assessment & Plan Note (Signed)
Patient has chronic baseline shortness of breath and cough which she reports worsened over the last week. Patient was feeling well Friday night when she went to bed. She woke up Saturday morning feeling terrible with sudden onset of worsened cough productive of thick clear sputum, worsened shortness of breath at rest, subjective fevers, chills, diaphoresis, myalgias, wheezing, and chest congestion. She has abdominal soreness associated with frequent cough.   She is not on oxygen at home. She has Albuterol, Advair, and Atrovent at home. She says her albuterol nebulizer has not been working properly for some time now. She has needed to use her rescue inhaler 20 times a day. She reports a sick contact in her son who had similar but milder symptoms. She did receive the flu shot on 09/25/2016.  With sudden onset of her symptoms, I suspect patient likely had the flu with resultant exacerbation of her COPD. She does have chronic cough with baseline shortness of breath, however this appears worsened over the last week. She has diffuse wheezing on exam with O2 saturation of 89% on room air. Given her chronic disease and comorbidities we have recommended that she be admitted for observation with supportive care and treatment with steroids, antibiotics, and breathing treatments. She understands and is agreeable. Discussed case with IMTS admitting team. -Admit for observation -Azithromycin, Prednisone, Nebulizers per admitting team -Oxygen prn -CXR ordered -continue smoking cessation counseling

## 2016-11-28 NOTE — Progress Notes (Signed)
CC: Flu like illness  HPI:  [name] is a 55 y.o. female with PMH of COPD, T2DM, HTN, dCHF, GERD, Endocarditis, tobacco use, s/p left BKA who presents for management of flu-like symptoms and right foot wound.  Likely Influenza with Acute on Chronic COPD exacerbation: Patient has chronic baseline shortness of breath and cough which she reports worsened over the last week. Patient was feeling well Friday night when she went to bed. She woke up Saturday morning feeling terrible with sudden onset of worsened cough productive of thick clear sputum, worsened shortness of breath at rest, subjective fevers, chills, diaphoresis, myalgias, wheezing, and chest congestion. She has abdominal soreness associated with frequent cough.   She is not on oxygen at home. She has Albuterol, Advair, and Atrovent at home. She says her albuterol nebulizer has not been working properly for some time now. She has needed to use her rescue inhaler 20 times a day. She reports a sick contact in her son who had similar but milder symptoms. She did receive the flu shot on 09/25/2016.   Right 5th toe wound: Patient reports a chronic wound on her right 5th toe which was managed at wound care and healing well. She says the wound reopened over Thanksgiving with bloody/yellow discharge. She has kept dry dressing over the area and scheduled an appointment with wound care for 12/08/16.    Past Medical History:  Diagnosis Date  . Anginal pain [facility])    '3' of 10 ischemia ruled out 9/9   . 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 Asthma   . CHF (congestive heart failure) ([place])   . 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 Chronic bronchitis ([place])    "I get it alot" (09/28/2013)  . Chronic diastolic heart failure (HCC)    grade 2 per 2D echocardiogram (01/2013)  . Chronic lower back pain   . Chronic osteomyelitis of foot (HCC)    chronic, right secondary to diabetic foot ulcers  . Chronic pain syndrome  12/03/2011   Likely secondary to depression, "fibromyalgia", neuropathy, and obesity. Lumbar MRI 2014 no sig change from prior (2008) : Stable hypertrophic facet disease most notable at L4-5. Stable shallow left foraminal/extraforaminal disc protrusion at L4-5. No direct neural compression.      Marland Kitchen COPD 01/08/2007   PFT's 05/2007 : FEV1/FVC 82, FEV1 64% pred, FEF 25-75% 40% predicted, 16% improvement in FEV1 with bronchodilators.     . Depression   . Diabetic peripheral neuropathy ([place])   . DVT of upper extremity (deep vein thrombosis) ([place]) 03/11/2013   Secondary to PICC line. Right brachial vein, diagnosed on 03/10/2013 Coumadin for 3 months. End date 06/10/2013   . Environmental allergies    Hx: of  . Exertional shortness of breath   . Fatty liver 2003   observed on ultrasound abdomen  . Fibromyalgia   . GERD (gastroesophageal reflux disease)   . Glaucoma   . Hyperlipidemia   . Hyperplastic colon polyp 12/2010   Per colonoscopy (12/2010) - Dr. Deatra Ina  . Hypertension   . 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 Blood cultures 01/26/13 S. Aureus and GBS. Blood cultures Feb 6th, 8th, and 9th and March were negative.Repeat TEE 3/20 negative for vegitations  . 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 2/2 tobacco use, DM, and cont weight bearing on surgical  wound and developed gangrene   . Polymicrobial bacterial infection 01/2013   GBS and S. aureus bacteremia // Source likely infected diabetic foot ulcer  . PVD (peripheral vascular disease) with claudication ([place])    Stents to bilateral common iliac arteries (left 2005, right 2008), on chronic plavix  . Rheumatoid arthritis ([place])   . Tobacco abuse   . Type II diabetes mellitus with peripheral circulatory disorders, uncontrolled DX: 1993   Insulin dep. Poor control. Complicated by diabetic foot ulcer and diabetic eye  disease.    Marland Kitchen Ulcer of foot, chronic (HCC)    Left. No OM per MRI (01/2013)    Review of Systems:   Review of Systems  Constitutional: Positive for chills, diaphoresis, fever and malaise/fatigue.  HENT: Negative for sore throat.   Respiratory: Positive for cough, sputum production, shortness of breath and wheezing. Negative for hemoptysis.   Cardiovascular: Negative for chest pain.  Musculoskeletal: Positive for myalgias.  Skin:       Wound right 5th toe  Neurological: Positive for weakness.     Physical Exam:  Vitals:   11/28/16 1010  BP: 112/60  Pulse: 69  Temp: 97.9 F (36.6 C)  SpO2: (!) 89%  Weight: 275 lb 6.4 oz (124.9 kg)   Physical Exam  Constitutional: She is oriented to person, place, and time.  Obese woman, resting in wheelchair, speaking in full sentences with intermittent coughing spells  HENT:  Mouth/Throat: Oropharynx is clear and moist.  No sinus tenderness  Neck: Neck supple.  Cardiovascular: Normal rate and regular rhythm.   Distant heart sounds  Pulmonary/Chest: Effort normal. No respiratory distress. She has wheezes. She has no rales.  Diffuse expiratory wheezing throughout the lungs  Musculoskeletal:  S/p left BKA with prosthesis. Large RLE with venous stasis dermatitis. Right 5th digit of foot has a superficial dorsal wound without active purulent drainage, erythema, or increased warmth to touch.   Lymphadenopathy:    She has no cervical adenopathy.  Neurological: She is alert and oriented to person, place, and time.  Skin: Skin is dry. She is not diaphoretic.       Assessment & Plan:   See Encounters Tab for problem based charting.  Patient seen with Dr. [name]

## 2016-11-28 NOTE — Assessment & Plan Note (Signed)
Patient reports a chronic wound on her right 5th toe which was managed at wound care and healing well. She says the wound reopened over Thanksgiving with bloody/yellow discharge. She has kept dry dressing over the area and scheduled an appointment with wound care for 12/08/16.  Wound is on dorsum of right 5th toe. It is malodorous, but does not appear to have superficial infection as there is no active purulent discharge or surrounding erythema, warmth, or cellulitic changes. Will obtain plain films to evaluate for any bony invasion/osteomyelitis. -Plain films right foot -Recommend wound care during admission -f/u with wound care on 12/18

## 2016-11-28 NOTE — H&P (Signed)
Date: 11/28/2016               Patient Name:  Jennifer [name] MRN: [id number]  DOB: June 21, 1961 Age / Sex: 55 y.o., female   PCP: Bartholomew Crews, MD         Medical Service: Internal Medicine Teaching Service         Attending Physician: Dr. Aldine Contes, MD    First Contact: Dr. Velna Ochs Pager: [phone]  Second Contact: Dr. Maryellen Pile Pager: [phone]       After Hours (After 5p/  First Contact Pager: [phone]  weekends / holidays): Second Contact Pager: [phone]   Chief Complaint: SOB, cough   History of Present Illness: Patient is a 55 yo F with pmhx of HTN, HLD, DM II, COPD, and chronic opoid use who presents from clinic with worsening SOB and cough. Patient said her symptoms began approximately one week ago and have progressively worsened. She has been using her rescue inhaler 4-5 times a day without relief. She is also on advair BID and atrovent prn at home. Her cough is productive of clear sputum. She endorses subjective fevers and diffuse cramping abdominal pain. After arriving to the hospital she had one episode of watery diarrhea. Her adult children have also been sick with similar symptoms of cough and fever. Patient is also a smoker, endorses smoking 1/2 PPD. She has cut back from 2 PPD and is trying to quit.   She was seen today in the Internal Medicine clinic and found to be hypoxic to 89% on RA with diffuse wheezing. Per chart review, she did receive her flu shot on 09/25/2016. Patients history is also inconsistent. In clinic she reported using her rescue inhaler up to 20 times a day. CXR was clear without signs of infiltrate or consolidation. On presentation she was afebrile and she was sating 91% on [place]. BP 137/65, HR 65, and RR 18. CBC was within normal limits; no leukocytosis. BMP largely unremarkable aside from a minor bump in her creatinine, 1.03 from baseline of 0.8.   Meds:  Current Meds  Medication Sig  . ADVAIR DISKUS 250-50 MCG/DOSE AEPB TAKE 1  INHALATION BY MOUTH TWICE DAILY  . amLODipine (NORVASC) 10 MG tablet TAKE 1 TABLET BY MOUTH EVERY DAY  . atenolol (TENORMIN) 100 MG tablet Take 1 tablet (100 mg total) by mouth daily.  . baclofen (LIORESAL) 10 MG tablet Take 1 tablet (10 mg total) by mouth 3 (three) times daily as needed for muscle spasms. Reported on 06/27/2016  . benazepril (LOTENSIN) 40 MG tablet Take 40 mg by mouth daily.  . benzonatate (TESSALON PERLES) 100 MG capsule Take 1 capsule (100 mg total) by mouth every 6 (six) hours as needed for cough.  . diphenhydrAMINE (BENADRYL) 25 MG tablet Take 2 tablets (50 mg total) by mouth at bedtime as needed. (Patient taking differently: Take 50 mg by mouth at bedtime. )  . fluticasone (FLONASE) 50 MCG/ACT nasal spray PLACE 1 SPRAY INTO BOTH NOSTRILS DAILY (Patient taking differently: PLACE 1 SPRAY INTO BOTH NOSTRILS DAILY AS NEEDED FOR CONGESTION)  . furosemide (LASIX) 80 MG tablet Take 80 mg by mouth daily as needed for fluid or edema (swelling).   . hydrochlorothiazide (HYDRODIURIL) 25 MG tablet Take 1 tablet (25 mg total) by mouth daily.  . Insulin Lispro Prot & Lispro (HUMALOG MIX 75/25 KWIKPEN) (75-25) 100 UNIT/ML Kwikpen INJECT 135 UNITS SUBCUTANEOUSLY IN THE MORNING BEFORE BREAKFAST & INJECT 85 UNITS SUBCUTANEOUSLY IN THE  EVENING BEFORE EVENING MEAL (Patient taking differently: Inject 85-140 Units into the skin See admin instructions. Inject 140 units subcutaneously before breakfast and 85 units before supper)  . irbesartan (AVAPRO) 300 MG tablet Take 1 tablet (300 mg total) by mouth daily.  Marland Kitchen LYRICA 200 MG capsule TAKE 1 CAPSULE BY MOUTH THREE TIMES A DAY  . metFORMIN (GLUCOPHAGE XR) 500 MG 24 hr tablet Take 1 tablet (500 mg total) by mouth daily with breakfast.  . oxyCODONE-acetaminophen (PERCOCET/ROXICET) 5-325 MG tablet Take 1-2 tablets by mouth every 4 (four) hours as needed for severe pain.  . pantoprazole (PROTONIX) 40 MG tablet TAKE 1 TABLET (40 MG TOTAL) BY MOUTH DAILY.  Marland Kitchen  potassium chloride SA (K-DUR,KLOR-CON) 20 MEQ tablet TAKE 2 TABLETS BY MOUTH DAILY  . PROAIR HFA 108 (90 Base) MCG/ACT inhaler INHALE 2 PUFFS BY MOUTH EVERY 6 HOURS AS NEEDED FOR WHEEZING (Patient taking differently: INHALE 2-4 PUFFS BY MOUTH EVERY 3 HOURS AS NEEDED FOR WHEEZING)  . ranitidine (ZANTAC) 150 MG tablet Take 1 tablet (150 mg total) by mouth 2 (two) times daily.  . rosuvastatin (CRESTOR) 20 MG tablet Take 20 mg by mouth at bedtime.  . sertraline (ZOLOFT) 100 MG tablet Take 1 tablet (100 mg total) by mouth daily.  . traZODone (DESYREL) 100 MG tablet TAKE 1 TABLET BY MOUTH DAILY AT BEDTIME AS NEEDED FOR SLEEP (Patient taking differently: Take 100 mg by mouth at bedtime as needed for sleep. )     Allergies: Allergies as of 11/28/2016 - Review Complete 11/28/2016  Allergen Reaction Noted  . Abilify [aripiprazole] Other (See Comments) 11/09/2015  . Iohexol  08/17/2007  . Ivp dye [iodinated diagnostic agents]  05/10/2013  . Morphine sulfate Itching and Rash    Past Medical History:  Diagnosis Date  . Anginal pain [facility])    '3' of 10 ischemia ruled out 9/9   . 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 Asthma   . CHF (congestive heart failure) ([place])   . 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 Chronic bronchitis ([place])    "I get it alot" (09/28/2013)  . Chronic diastolic heart failure (HCC)    grade 2 per 2D echocardiogram (01/2013)  . Chronic lower back pain   . Chronic osteomyelitis of foot (HCC)    chronic, right secondary to diabetic foot ulcers  . Chronic pain syndrome 12/03/2011   Likely secondary to depression, "fibromyalgia", neuropathy, and obesity. Lumbar MRI 2014 no sig change from prior (2008) : Stable hypertrophic facet disease most notable at L4-5. Stable shallow left foraminal/extraforaminal disc protrusion at L4-5. No direct neural compression.      Marland Kitchen COPD 01/08/2007   PFT's 05/2007 : FEV1/FVC 82, FEV1 64% pred, FEF 25-75% 40% predicted, 16% improvement in  FEV1 with bronchodilators.     . Depression   . Diabetic peripheral neuropathy ([place])   . DVT of upper extremity (deep vein thrombosis) ([place]) 03/11/2013   Secondary to PICC line. Right brachial vein, diagnosed on 03/10/2013 Coumadin for 3 months. End date 06/10/2013   . Environmental allergies    Hx: of  . Exertional shortness of breath   . Fatty liver 2003   observed on ultrasound abdomen  . Fibromyalgia   . GERD (gastroesophageal reflux disease)   . Glaucoma   . Hyperlipidemia   . Hyperplastic colon polyp 12/2010   Per colonoscopy (12/2010) - Dr. Deatra Ina  . Hypertension   . 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 Blood cultures 01/26/13 S. Aureus and GBS. Blood cultures Feb 6th, 8th, and 9th and March were negative.Repeat TEE 3/20 negative for vegitations  . 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 2/2 tobacco use, DM, and cont weight bearing on surgical wound and developed gangrene   . Polymicrobial bacterial infection 01/2013   GBS and S. aureus bacteremia // Source likely infected diabetic foot ulcer  . PVD (peripheral vascular disease) with claudication ([place])    Stents to bilateral common iliac arteries (left 2005, right 2008), on chronic plavix  . Rheumatoid arthritis ([place])   . Tobacco abuse   . Type II diabetes mellitus with peripheral circulatory disorders, uncontrolled DX: 1993   Insulin dep. Poor control. Complicated by diabetic foot ulcer and diabetic eye disease.    Marland Kitchen Ulcer of foot, chronic (HCC)    Left. No OM per MRI (01/2013)    Family History: Asthma (both parents), DM II, Diverticulosis and CHF in Mom   Social History: Lives at home with her son. Denies alcohol and illicit drug use. Smoke 1/2 PPD.   Review of Systems: A complete ROS was negative except as per HPI.   Physical Exam: Blood pressure 137/65, pulse 65, temperature 98.1 F (36.7  C), temperature source Oral, resp. rate 18, SpO2 90 %. Physical Exam Constitutional: Obese woman, breathing comfortable on 3L [place], irritable on questioning  HEENT: Atraumatic, normocephalic.  Cardiovascular: RRR, no murmurs, rubs, or gallops.  Pulmonary/Chest: Diffuse expiratory wheezing in all lung fields  Abdominal: Soft, non tender, non distended. +BS.  Extremities: Left BKA with prosthesis. Right lower extremity with 2-3+ pitting edema. Distal pulses intact. Right 5th digit with open wound, non purulent, no erythema or increased warmth to touch   Neurological: A&Ox3, CN II - XII grossly intact.  Skin: No rashes or erythema  Psychiatric: Normal mood and affect  CXR: Personally reviewed, no evidence of infiltrate or COPD exacerbation   Assessment & Plan by Problem:  COPD Exacerbation: In the setting of URI symptoms for 1 weeks, concern for flu. CXR is negative however patient is diffusely wheezing on exam with a new oxygen requirement.  -- Supplemental oxygen prn to maintain sats > 92 -- Flu swab pending; droplet precaution  -- Dunebs q6 hrs prn -- Azithromycin 500 mg once today, then 250 mg daily starting tomorrow -- Prednisone 40 mg daily -- Tessalon q6 prn for cough -- Albuterol nebs q6 prn -- Continue home flonase daily   HTN: Normotensive on admission, reports compliance. Continue home meds. -- Amlodipine 10 mg daily -- Atenolol 100 mg daily  -- Benazepril 40 mg daily   HFpEF:  -- Continue lasix 80 mg daily prn  -- Continue BP meds as above  DM II:  -- Continue home Novolog Mix 70/30 140 units daily with breakfast, 85 units daily with supper -- SSI - moderate  -- Continue home Lyrica 200 mg TID for neuropathy   Chronic Pain: Opioid dependent  -- Continue home percocet 1-2 tablets q4 prn  Depression: -- Continue home Trazodone 100 mg QHS -- Zoloft 100 mg daily  GERD: -- Continue Protonix 40 mg daily   HLD: -- Continue home Crestor 20 mg daily   FEN: No  fluids, replete lytes prn, carb mod diet VTE ppx: Lovenox  Code Status: FULL  Dispo: Admit patient to Observation with expected length of stay less than 2 midnights.  Signed: Hoyle Sauer  Rheda Kassab, MD 11/28/2016, 2:39 PM  Pager: [phone]

## 2016-11-28 NOTE — Consult Note (Signed)
   [facility] CM Inpatient Consult   11/28/2016  [name] 11-05-1961 [id number]   [facility] Care Management active with [name]. She is agreeable to ongoing follow up. States she does not recall not being able to be reached but states she definitely wants to continue with [facility] Management services. Will make inpatient RNCM aware [facility] is active as well updating [place].   Marthenia Rolling, MSN-Ed, RN,BSN [facility] Liaison [phone]

## 2016-11-28 NOTE — Progress Notes (Signed)
Initial Nutrition Assessment  DOCUMENTATION CODES:   Morbid obesity  INTERVENTION:   30 ml Prostat BID  NUTRITION DIAGNOSIS:   Increased nutrient needs related to wound healing as evidenced by estimated needs.  GOAL:   Patient will meet greater than or equal to 90% of their needs  MONITOR:   PO intake, Supplement acceptance, I & O's  REASON FOR ASSESSMENT:   Malnutrition Screening Tool    ASSESSMENT:   55 y.o. female with PMH of COPD, T2DM, HTN, dCHF, GERD, Endocarditis, tobacco use, s/p left BKA who presents for management of flu-like symptoms and right foot wound.   Pt feels that she has lost weight from not feeling well. She does not weigh herself so unsure of usual weight.  Pt insistent that she have her wheelchair beside her bed. Made sure that the breaks were engaged and educated pt to use the call bell before she got up and allow staff to assist her. Notified RN, Gerald Stabs of this.  Nutrition-Focused physical exam completed. Findings are no fat depletion, no muscle depletion, and no edema.   Diet Order:  Diet Carb Modified Fluid consistency: Thin; Room service appropriate? Yes  Skin:  Wound (see comment) (DM R foot ulcer)  Last BM:  12/7  Height:   Ht Readings from Last 1 Encounters:  11/07/16 5\' 7"  (1.702 m)    Weight:   Wt Readings from Last 1 Encounters:  11/28/16 275 lb 6.4 oz (124.9 kg)    Ideal Body Weight:  57.5 kg  BMI:  There is no height or weight on file to calculate BMI.  Estimated Nutritional Needs:   Kcal:  1900-2100  Protein:  110-130 grams  Fluid:  > 2 L/day  EDUCATION NEEDS:   No education needs identified at this time  [place], [place], [place] Pager [phone] After Hours Pager

## 2016-11-29 ENCOUNTER — Encounter (HOSPITAL_COMMUNITY): Payer: Self-pay

## 2016-11-29 DIAGNOSIS — J441 Chronic obstructive pulmonary disease with (acute) exacerbation: Secondary | ICD-10-CM | POA: Diagnosis not present

## 2016-11-29 DIAGNOSIS — Z79899 Other long term (current) drug therapy: Secondary | ICD-10-CM | POA: Diagnosis not present

## 2016-11-29 DIAGNOSIS — I110 Hypertensive heart disease with heart failure: Secondary | ICD-10-CM | POA: Diagnosis not present

## 2016-11-29 DIAGNOSIS — J449 Chronic obstructive pulmonary disease, unspecified: Secondary | ICD-10-CM | POA: Diagnosis not present

## 2016-11-29 DIAGNOSIS — I38 Endocarditis, valve unspecified: Secondary | ICD-10-CM | POA: Diagnosis not present

## 2016-11-29 DIAGNOSIS — E1142 Type 2 diabetes mellitus with diabetic polyneuropathy: Secondary | ICD-10-CM | POA: Diagnosis not present

## 2016-11-29 DIAGNOSIS — Z7984 Long term (current) use of oral hypoglycemic drugs: Secondary | ICD-10-CM | POA: Diagnosis not present

## 2016-11-29 DIAGNOSIS — I5032 Chronic diastolic (congestive) heart failure: Secondary | ICD-10-CM | POA: Diagnosis not present

## 2016-11-29 LAB — CBC
HCT: 46.9 % — ABNORMAL HIGH (ref 36.0–46.0)
Hemoglobin: 14.5 g/dL (ref 12.0–15.0)
MCH: 30.7 pg (ref 26.0–34.0)
MCHC: 30.9 g/dL (ref 30.0–36.0)
MCV: 99.2 fL (ref 78.0–100.0)
Platelets: 141 10*3/uL — ABNORMAL LOW (ref 150–400)
RBC: 4.73 MIL/uL (ref 3.87–5.11)
RDW: 14.3 % (ref 11.5–15.5)
WBC: 7.6 10*3/uL (ref 4.0–10.5)

## 2016-11-29 LAB — BASIC METABOLIC PANEL
Anion gap: 10 (ref 5–15)
BUN: 14 mg/dL (ref 6–20)
CO2: 27 mmol/L (ref 22–32)
Calcium: 8.7 mg/dL — ABNORMAL LOW (ref 8.9–10.3)
Chloride: 100 mmol/L — ABNORMAL LOW (ref 101–111)
Creatinine, Ser: 1 mg/dL (ref 0.44–1.00)
GFR calc Af Amer: >60 mL/min (ref >60)
GFR calc non Af Amer: >60 mL/min (ref >60)
Glucose, Bld: 344 mg/dL — ABNORMAL HIGH (ref 65–99)
Potassium: 4.8 mmol/L (ref 3.5–5.1)
Sodium: 137 mmol/L (ref 135–145)

## 2016-11-29 MED ORDER — PREDNISONE 20 MG PO TABS: 40.0000 mg | ORAL_TABLET | Freq: Every day | ORAL | 0 refills | Status: DC

## 2016-11-29 MED ORDER — AZITHROMYCIN 250 MG PO TABS: 250.0000 mg | ORAL_TABLET | Freq: Every day | ORAL | 0 refills | Status: DC

## 2016-11-29 MED ORDER — AZITHROMYCIN 500 MG PO TABS
250.0000 mg | ORAL_TABLET | ORAL | Status: DC
  Administered 2016-11-29: 250 mg via ORAL
  Filled 2016-11-29: qty 1

## 2016-11-29 NOTE — Progress Notes (Signed)
Pt states home neb machine is not working properly and is in need of a new one. She believes that if she had a new home neb machine, it would help keep her out of the hospital. RT will speak with RN to see if case manager can help pt with obtaining new machine. RT will continue to monitor.

## 2016-11-29 NOTE — Care Management Note (Signed)
Case Management Note  Patient Details  Name: Jennifer [name] MRN: [id number] Date of Birth: 1961/08/02  Subjective/Objective:            Patient peresented to [facility] ED with c/o SOB and cough x1 week.    Action/Plan: CM met with patient at bedside to discuss transitonal care planning. Patient reports living alone with family support. Patient's  PMH of COPD not on home O2, HTN, HLD, DM, foot ulcers.  Patent states, she does not have have a functioning home neb machine any longer. CM called in DME referral for neb machine, confirmed machine will be delivered to room prior to discharge.  Patient states she is not active with any HH services at this time. Discussed the recommendations and benefits to [facility] RN,PT,OT services, patient is agreeable with transitional care plan. Offered choice [facility] selected. Liaison Christa See with [facility] contacted referral accepted. Patient verbalized understanding teach back done. No further questions or concerns verbalized.   Expected Discharge Date: 11/29/2016                  Expected Discharge Plan:  [place]  In-House Referral:     Discharge planning Services  CM Consult  Post Acute Care Choice:    Choice offered to:  Patient  DME Arranged:  Nebulizer machine DME Agency:  [place] Arranged: RN wound management, Disease Management, PT, OT [place] Agency: [facility]    Status of Service:  Completed, signed off  If discussed at [place] of Stay Meetings, dates discussed:    Additional CommentsLaurena Slimmer, RN 11/29/2016, 11:50 AM

## 2016-11-29 NOTE — Progress Notes (Signed)
Physical Therapy Treatment Patient Details Name: Jennifer [name] MRN: [id number] DOB: [birth year] Today's Date: 11/29/2016    History of Present Illness patient is a 55 y/o female with PMH of COPD not on home O2, HTN, HLD, DM and chronic opiod use who presents with worsening SOB and cough times 1 week.    PT Comments    Pt is at or close to baseline functioning and should be safe at home with her w/c and other assistive devices. There are no further acute PT needs, but patient will benefit from HHPT to improve tolerance to activity.  Will sign off at this time.   Follow Up Recommendations  Home health PT     Equipment Recommendations  None recommended by PT    Recommendations for Other Services       Precautions / Restrictions Precautions Precautions: Fall    Mobility  Bed Mobility               General bed mobility comments: pt up EOB on arrival  Transfers Overall transfer level: Needs assistance Equipment used: None (wheel chair) Transfers: Sit to/from Stand Sit to Stand: Supervision         General transfer comment: no device  Ambulation/Gait Ambulation/Gait assistance: Supervision Ambulation Distance (Feet): 15 Feet (x2) Assistive device: None (furniture walked) Gait Pattern/deviations: Step-through pattern     General Gait Details: mildly unsteady due to no AD.  Pt stated generally she would use the W/C in the home.   Stairs            Wheelchair Mobility    Modified Rankin (Stroke Patients Only)       Balance Overall balance assessment: Needs assistance Sitting-balance support: No upper extremity supported Sitting balance-Leahy Scale: Good     Standing balance support: Bilateral upper extremity supported;Single extremity supported Standing balance-Leahy Scale: Fair Standing balance comment: short episodes standing with no assist                    Cognition Arousal/Alertness: Awake/alert Behavior During Therapy: WFL for  tasks assessed/performed Overall Cognitive Status: Within Functional Limits for tasks assessed                      Exercises      General Comments General comments (skin integrity, edema, etc.): pt's SpO2 at 89/90% on RA after walking around the room.      Pertinent Vitals/Pain Pain Assessment: No/denies pain    Home Living Family/patient expects to be discharged to:: Private residence Living Arrangements: Children Available Help at Discharge: Family;Available PRN/intermittently Type of Home: House Home Access: Stairs to enter Entrance Stairs-Rails: Right Home Layout: One level Home Equipment: Bedside commode;Shower seat;Walker - 2 wheels;Other (comment);Wheelchair - manual      Prior Function Level of Independence: Independent with assistive device(s)      Comments: states she walks only short distances but uses walker to get to BR and around house   PT Goals (current goals can now be found in the care plan section) Acute Rehab PT Goals PT Goal Formulation: All assessment and education complete, DC therapy Progress towards PT goals: Goals met/education completed, patient discharged from PT    Frequency           PT Plan Current plan remains appropriate    Co-evaluation             End of Session   Activity Tolerance: Patient tolerated treatment well;Other (comment) (limited endurance)  Time: 8208-1388 PT Time Calculation (min) (ACUTE ONLY): 22 min  Charges:                       G Codes:  Functional Assessment Tool Used: clinical judgement Functional Limitation: Mobility: Walking and moving around Mobility: Walking and Moving Around Current Status [phone]): 0 percent impaired, limited or restricted Mobility: Walking and Moving Around Goal Status [phone]): 0 percent impaired, limited or restricted Mobility: Walking and Moving Around Discharge Status [phone]): 0 percent impaired, limited or restricted   Tessie Fass [place] 11/29/2016,  3:09 PM 11/29/2016  Donnella Sham, PT [phone]  (pager)

## 2016-11-29 NOTE — Discharge Summary (Signed)
Name: Jennifer [name] MRN: [id number] DOB: 06/16/1961 55 y.o. PCP: Bartholomew Crews, MD  Date of Admission: 11/28/2016 12:13 PM Date of Discharge: 11/29/2016 Attending Physician: Aldine Contes, MD  Discharge Diagnosis: 1. COPD exacerbation  Discharge Medications: Allergies as of 11/29/2016      Reactions   Abilify [aripiprazole] Other (See Comments)   Urinary freq Nov 2016   Iohexol     Desc: IV CONTRAST CAUSE NEPHROPATHY IN 2007   Ivp Dye [iodinated Diagnostic Agents]    Morphine Sulfate Itching, Rash      Medication List    STOP taking these medications   atorvastatin 80 MG tablet Commonly known as:  LIPITOR   irbesartan 300 MG tablet Commonly known as:  AVAPRO     TAKE these medications   ADVAIR DISKUS 250-50 MCG/DOSE Aepb Generic drug:  Fluticasone-Salmeterol TAKE 1 INHALATION BY MOUTH TWICE DAILY   amLODipine 10 MG tablet Commonly known as:  NORVASC TAKE 1 TABLET BY MOUTH EVERY DAY   atenolol 100 MG tablet Commonly known as:  TENORMIN Take 1 tablet (100 mg total) by mouth daily.   azithromycin 250 MG tablet Commonly known as:  ZITHROMAX Take 1 tablet (250 mg total) by mouth daily.   baclofen 10 MG tablet Commonly known as:  LIORESAL Take 1 tablet (10 mg total) by mouth 3 (three) times daily as needed for muscle spasms. Reported on 06/27/2016   benazepril 40 MG tablet Commonly known as:  LOTENSIN Take 40 mg by mouth daily.   benzonatate 100 MG capsule Commonly known as:  TESSALON PERLES Take 1 capsule (100 mg total) by mouth every 6 (six) hours as needed for cough.   diphenhydrAMINE 25 MG tablet Commonly known as:  BENADRYL Take 2 tablets (50 mg total) by mouth at bedtime as needed. What changed:  when to take this   EASY TOUCH PEN NEEDLES 31G X 8 MM Misc Generic drug:  Insulin Pen Needle USE TO INJECT INSULIN TWICE DAILY   fluticasone 50 MCG/ACT nasal spray Commonly known as:  FLONASE PLACE 1 SPRAY INTO BOTH NOSTRILS DAILY What  changed:  See the new instructions.   furosemide 40 MG tablet Commonly known as:  LASIX Take 1 tablet (40 mg total) by mouth daily as needed. What changed:  Another medication with the same name was removed. Continue taking this medication, and follow the directions you see here.   hydrochlorothiazide 25 MG tablet Commonly known as:  HYDRODIURIL Take 1 tablet (25 mg total) by mouth daily.   ipratropium 0.02 % nebulizer solution Commonly known as:  ATROVENT USE 1 VIAL VIA NEBULIZER EVERY 6 HOURS AS NEEDED FOR WHEEZING   lidocaine 5 % Commonly known as:  LIDODERM Place 1 patch onto the skin daily. Remove & Discard patch within 12 hours or as directed by MD   LYRICA 200 MG capsule Generic drug:  pregabalin TAKE 1 CAPSULE BY MOUTH THREE TIMES A DAY   metFORMIN 500 MG 24 hr tablet Commonly known as:  GLUCOPHAGE XR Take 1 tablet (500 mg total) by mouth daily with breakfast.   nystatin 100000 UNIT/ML suspension Commonly known as:  MYCOSTATIN Take 5 mLs (500,000 Units total) by mouth 4 (four) times daily.   nystatin powder Commonly known as:  MYCOSTATIN/NYSTOP APPLY TO AFFECTED AREA THREE TIMES A DAY   ONETOUCH DELICA LANCETS [id number] Misc USE TO CHECK BLOOD SUGARS FOUR TIMES A DAY   ONETOUCH VERIO test strip Generic drug:  glucose blood USE 3-4 TIMES DAILY TO  CHECK BLOOD SUGAR   oxyCODONE-acetaminophen 5-325 MG tablet Commonly known as:  PERCOCET/ROXICET Take 1-2 tablets by mouth every 4 (four) hours as needed for severe pain.   pantoprazole 40 MG tablet Commonly known as:  PROTONIX TAKE 1 TABLET (40 MG TOTAL) BY MOUTH DAILY.   potassium chloride SA 20 MEQ tablet Commonly known as:  K-DUR,KLOR-CON TAKE 2 TABLETS BY MOUTH DAILY   predniSONE 20 MG tablet Commonly known as:  DELTASONE Take 2 tablets (40 mg total) by mouth daily with breakfast.   PROAIR HFA 108 (90 Base) MCG/ACT inhaler Generic drug:  albuterol INHALE 2 PUFFS BY MOUTH EVERY 6 HOURS AS NEEDED FOR  WHEEZING What changed:  See the new instructions.   ranitidine 150 MG tablet Commonly known as:  ZANTAC Take 1 tablet (150 mg total) by mouth 2 (two) times daily.   rosuvastatin 20 MG tablet Commonly known as:  CRESTOR Take 20 mg by mouth at bedtime.   sertraline 100 MG tablet Commonly known as:  ZOLOFT Take 1 tablet (100 mg total) by mouth daily.   traZODone 100 MG tablet Commonly known as:  DESYREL TAKE 1 TABLET BY MOUTH DAILY AT BEDTIME AS NEEDED FOR SLEEP What changed:  how much to take  how to take this  when to take this  reasons to take this  additional instructions   varenicline 0.5 MG tablet Commonly known as:  CHANTIX Take one pill once a day for 3 days. Then one pill twice a day for 4 days. Then quit smoking and go to higher dose   varenicline 1 MG tablet Commonly known as:  CHANTIX Take 1 tablet (1 mg total) by mouth 2 (two) times daily. Take this after you complete the 0.5 mg dose       Disposition and follow-up:   [name] was discharged from [facility] in Stable condition.  At the hospital follow up visit please address:  1.  COPD Exacerbation: Patient was discharged with a prescription for azithromycin and prednisone to complete a 5 day course.  2. R 5th Toe Wound: No signs of infection. Please have patient follow up with wound care if indicated.   3.  Labs / imaging needed at time of follow-up: None  4.  Pending labs/ test needing follow-up: None  Follow-up Appointments: Follow-up Information    [facility] Follow up.   Specialty:  Home Health Services Why:  Home Health RN, PT/OT Contact information: [place] [address] [phone]        [place]. Schedule an appointment as soon as possible for a visit in 1 week(s).   Why:  If you do not hear from [place] on Monday, please call and schedule an appointment for the next week Contact information: [address] [facility] Course by problem list:  1. COPD Exacerbation: Patient presented from clinic with increasing SOB and increased sputum production x 1 week. She reported using her rescue inhaler 4-5 times a day without relief. She was found to be hypoxic to 89% on RA in clinic with diffuse wheezing on exam. CXR was clear without signs of infiltrate or consolidation. She was afebrile and hemodynamically stable. CBC was within normal limits without leukocytosis. Influenza panel was negative. She was started on azithromycin, prednisone, and duonebs q6hrs prn. Symptoms improved the following day and she was successfully  weaned off oxygen. She was discharged with prescriptions of azithromycin and prednisone to complete a 5 day course.  2. R 5th Toe Wound: She reported a prior wound which was healing well but then opened back up again around Thanksgiving. Wound care was consulted. There was no signs of active infection and plain films showed no signs of osteomyelitis.   Discharge Vitals:   BP 138/68   Pulse 63   Temp 98.2 F (36.8 C) (Oral)   Resp (!) 22   Ht 5\' 7"  (1.702 m)   Wt 275 lb 5.7 oz (124.9 kg)   SpO2 96%   BMI 43.13 kg/m   Pertinent Labs, Studies, and Procedures:   11/28/2016 DG Chest 2 View: IMPRESSION: No active cardiopulmonary disease.  11/28/2016 DG Foot 2 Views Right: IMPRESSION: 1. Soft tissue ulceration noted about the right fifth digit. No radiopaque foreign body. No underlying acute bony abnormality. If osteomyelitis remains a concern MRI can be obtained.  2.  Diffuse degenerative change.   Discharge Instructions: Discharge Instructions    Call MD for:  difficulty breathing, headache or visual disturbances    Complete by:  As directed    Call MD for:  temperature >100.4    Complete by:  As directed    Diet - low sodium heart healthy    Complete by:  As directed    Discharge instructions    Complete  by:  As directed    Ms. Dorann Lodge were hospitalized for an exacerbation of your COPD that required oxygen. We have treated you with antibiotics and steroids. Please take the Azithromycin 250 mg daily for the next 3 days and the Prednisone 40 mg daily for the next 3 days as well. I have sent a message to clinic to schedule you a follow up appointment in the next week to make sure you are still doing well once you leave the hospital. If you do not hear from the clinic on Monday, please call and schedule an appointment.   Increase activity slowly    Complete by:  As directed       Signed: Velna Ochs, MD 12/06/2016, 3:07 PM   Pager: [phone]

## 2016-11-29 NOTE — Progress Notes (Signed)
   Subjective:  Doing well this morning. No complaints today. Reports breathing is much improved today. Off of supplemental O2 overnight. Still wheezing but improving. Still coughing but no longer bringing up any mucus. Would like to go home today.  Objective:  Vital signs in last 24 hours: Vitals:   11/29/16 0539 11/29/16 0859 11/29/16 0912 11/29/16 1341  BP: (!) 164/78  138/68   Pulse: 66 84 63   Resp: 20 (!) 22 (!) 22   Temp: 98.7 F (37.1 C)  98.2 F (36.8 C)   TempSrc: Oral  Oral   SpO2: 94% 95% 97% 96%  Weight:      Height:       Gen: Obese female, AAOx3, in NAD CVS: RRR, no m/r/g Lungs: +b/l diffuse expiratory wheezing, good airflow Abd: soft, non tender, BS + Ext: No edema, L BKA +, R LE 2 + pitting edema, R 5th digit with wound on dorsal surface, no purulent discharge, no surrounding cellulitis  Assessment/Plan:  COPD Exacerbation: Likely secondary to viral illness over the past week. Flu negative and patient has received her flu vaccine this year. No longer requiring any supplemental oxygen. Wheezing still present on exam this morning but improving. Symptomatic improvement with duonebs. It appears that the patient's nebulizer machine at home has broken. Has been unable to do nebulizer treatments at home due to this and likely a precipitating factor in her hospitalization. Spoke with CM, ordered for new home neb machine. CXR yesterday negative for any acute process.  -- Check ambulatory O2 sats -- Dunebs q6 hrs prn -- Azithromycin 250 mg daily x 4 days -- Prednisone 40 mg daily x 5 days total -- Tessalon q6 prn for cough -- Albuterol nebs q6 prn -- Continue home flonase daily  -- PT consult pending   HTN: Normotensive on admission, reports compliance. Continue home meds. -- Amlodipine 10 mg daily -- Atenolol 100 mg daily  -- Benazepril 40 mg daily   HFpEF:  -- Continue lasix 80 mg daily prn  -- Continue BP meds as above  DM II:  CBG 344 this morning. Likely  2/2 the steroid use. Will continue her home dose insulin. Suspect will improve following completion her steroid course.  -- Continue home Novolog Mix 70/30 140 units daily with breakfast, 85 units daily with supper -- SSI - moderate  -- Continue home Lyrica 200 mg TID for neuropathy   Chronic Pain: Opioid dependent  -- Continue home percocet 1-2 tablets q4 prn  Depression: -- Continue home Trazodone 100 mg QHS -- Zoloft 100 mg daily  GERD: -- Continue Protonix 40 mg daily   HLD: -- Continue home Crestor 20 mg daily   FEN: No fluids, replete lytes prn, carb mod diet VTE ppx: Lovenox  Code Status: FULL  Dispo: Anticipated discharge this afternoon pending ambulatory stats and PT evaluation  Maryellen Pile, MD 11/29/2016, 1:56 PM Pager: [phone]

## 2016-11-29 NOTE — Progress Notes (Signed)
Internal Medicine Attending:   I saw and examined the patient. I reviewed the resident's note and I agree with the resident's findings and plan as documented in the resident's note.  Patient feels better and has no new complaints. SOB is much improved. C/w PO prednisone and azithromycin for COPD exacerbation likely secondary to an acute viral infection. Flu PCR is negative. Patient stable for d/c home today with home PT. Will need close f/u with [facility] and wound care clinic.

## 2016-11-29 NOTE — Progress Notes (Signed)
Spoke with RN. Patient worked with PT and had desaturations only to 89-90% on RA with ambulation. Does not qualify for home supplemental O2. PT recommending home health PT. Patient stable for discharge.

## 2016-12-01 ENCOUNTER — Other Ambulatory Visit: Payer: Self-pay

## 2016-12-01 DIAGNOSIS — M797 Fibromyalgia: Secondary | ICD-10-CM | POA: Diagnosis not present

## 2016-12-01 DIAGNOSIS — E1121 Type 2 diabetes mellitus with diabetic nephropathy: Secondary | ICD-10-CM | POA: Diagnosis not present

## 2016-12-01 DIAGNOSIS — E1151 Type 2 diabetes mellitus with diabetic peripheral angiopathy without gangrene: Secondary | ICD-10-CM | POA: Diagnosis not present

## 2016-12-01 DIAGNOSIS — E1142 Type 2 diabetes mellitus with diabetic polyneuropathy: Secondary | ICD-10-CM | POA: Diagnosis not present

## 2016-12-01 DIAGNOSIS — G894 Chronic pain syndrome: Secondary | ICD-10-CM | POA: Diagnosis not present

## 2016-12-01 DIAGNOSIS — E11319 Type 2 diabetes mellitus with unspecified diabetic retinopathy without macular edema: Secondary | ICD-10-CM | POA: Diagnosis not present

## 2016-12-01 DIAGNOSIS — E11621 Type 2 diabetes mellitus with foot ulcer: Secondary | ICD-10-CM | POA: Diagnosis not present

## 2016-12-01 DIAGNOSIS — L97511 Non-pressure chronic ulcer of other part of right foot limited to breakdown of skin: Secondary | ICD-10-CM | POA: Diagnosis not present

## 2016-12-01 DIAGNOSIS — E785 Hyperlipidemia, unspecified: Secondary | ICD-10-CM | POA: Diagnosis not present

## 2016-12-01 DIAGNOSIS — J441 Chronic obstructive pulmonary disease with (acute) exacerbation: Secondary | ICD-10-CM | POA: Diagnosis not present

## 2016-12-01 DIAGNOSIS — E1159 Type 2 diabetes mellitus with other circulatory complications: Secondary | ICD-10-CM | POA: Diagnosis not present

## 2016-12-01 DIAGNOSIS — I5032 Chronic diastolic (congestive) heart failure: Secondary | ICD-10-CM | POA: Diagnosis not present

## 2016-12-01 DIAGNOSIS — I110 Hypertensive heart disease with heart failure: Secondary | ICD-10-CM | POA: Diagnosis not present

## 2016-12-01 LAB — GLUCOSE, CAPILLARY
Glucose-Capillary: 345 mg/dL — ABNORMAL HIGH (ref 65–99)
Glucose-Capillary: 264 mg/dL — ABNORMAL HIGH (ref 65–99)
Glucose-Capillary: 228 mg/dL — ABNORMAL HIGH (ref 65–99)

## 2016-12-01 NOTE — Patient Outreach (Signed)
Transition of care: Admitted on 11/28/2016 Discharged on 11/29/2016  Placed call to patient for transition of care, no answer. Left a message requesting a call back.  Tomasa Rand, RN, BSN, CEN [facility] [employer] [phone]

## 2016-12-01 NOTE — Patient Outreach (Signed)
Transition of care call: Patient returned call and states that she is okay. Reports that she has all her medications.  Reports home health nurse has been out to see her today.    Denies follow up appointment at this time. Reports that she will call and make an appointment.    Reports that she has plans to go to wound center about her toe on 12/18 for follow up.     Denies any new problems or concerns today.   PLAN: Explained that I would be her case manager at this time. Reviewed transition of care program with weekly outreach attempts.  Offered home visit and patient reports that she needs to find out when her hospital follow up appointment is first.  Patient request I call her back later.  Called patient back and she has agreed to a transition of care visit tomorrow at [place] Problem One   Flowsheet Row Most Recent Value  Care Plan Problem One  Recent hospital admission for COPD  Role Documenting the Problem One  Care Management [place] for Problem One  Active  [facility] Long Term Goal (31-90 days)  Patient will report no readmission related to COPD in the next 60 days.  [facility] Long Term Goal Start Date  12/01/16  Interventions for Problem One Long Term Goal  Reviewed transition of care program.  Reviewed discharge instructions. Provided my contact information.  [facility] CM Short Term Goal #1 (0-30 days)  Patient will report attending hospital follow up within the next 10 days.    [facility] CM Short Term Goal #1 Start Date  12/01/16  Interventions for Short Term Goal #1  Reviewed importance of timely follow up with MD.   [facility] CM Short Term Goal #2 (0-30 days)  Patient will report taking all her medications as prescribed in the next 30 days.  [facility] CM Short Term Goal #2 Start Date  12/01/16  Interventions for Short Term Goal #2  Reviewed importance of patient taking all his medications as prescribed.     Tomasa Rand, RN, BSN, CEN [facility] [employer] [phone]

## 2016-12-01 NOTE — Progress Notes (Signed)
Internal Medicine Clinic Attending  I saw and evaluated the patient.  I personally confirmed the key portions of the history and exam documented by Dr. Patel,Vishal and I reviewed pertinent patient test results.  The assessment, diagnosis, and plan were formulated together and I agree with the documentation in the resident's note.  

## 2016-12-02 ENCOUNTER — Other Ambulatory Visit: Payer: Self-pay | Admitting: Internal Medicine

## 2016-12-02 ENCOUNTER — Other Ambulatory Visit: Payer: Self-pay

## 2016-12-02 DIAGNOSIS — G894 Chronic pain syndrome: Secondary | ICD-10-CM | POA: Diagnosis not present

## 2016-12-02 DIAGNOSIS — L97511 Non-pressure chronic ulcer of other part of right foot limited to breakdown of skin: Secondary | ICD-10-CM | POA: Diagnosis not present

## 2016-12-02 DIAGNOSIS — E1151 Type 2 diabetes mellitus with diabetic peripheral angiopathy without gangrene: Secondary | ICD-10-CM | POA: Diagnosis not present

## 2016-12-02 DIAGNOSIS — E785 Hyperlipidemia, unspecified: Secondary | ICD-10-CM | POA: Diagnosis not present

## 2016-12-02 DIAGNOSIS — I152 Hypertension secondary to endocrine disorders: Secondary | ICD-10-CM

## 2016-12-02 DIAGNOSIS — E1159 Type 2 diabetes mellitus with other circulatory complications: Secondary | ICD-10-CM

## 2016-12-02 DIAGNOSIS — I10 Essential (primary) hypertension: Principal | ICD-10-CM

## 2016-12-02 DIAGNOSIS — E1121 Type 2 diabetes mellitus with diabetic nephropathy: Secondary | ICD-10-CM | POA: Diagnosis not present

## 2016-12-02 DIAGNOSIS — I5032 Chronic diastolic (congestive) heart failure: Secondary | ICD-10-CM | POA: Diagnosis not present

## 2016-12-02 DIAGNOSIS — E1142 Type 2 diabetes mellitus with diabetic polyneuropathy: Secondary | ICD-10-CM | POA: Diagnosis not present

## 2016-12-02 DIAGNOSIS — I110 Hypertensive heart disease with heart failure: Secondary | ICD-10-CM | POA: Diagnosis not present

## 2016-12-02 DIAGNOSIS — E11621 Type 2 diabetes mellitus with foot ulcer: Secondary | ICD-10-CM | POA: Diagnosis not present

## 2016-12-02 DIAGNOSIS — E11319 Type 2 diabetes mellitus with unspecified diabetic retinopathy without macular edema: Secondary | ICD-10-CM | POA: Diagnosis not present

## 2016-12-02 DIAGNOSIS — J441 Chronic obstructive pulmonary disease with (acute) exacerbation: Secondary | ICD-10-CM | POA: Diagnosis not present

## 2016-12-02 DIAGNOSIS — M797 Fibromyalgia: Secondary | ICD-10-CM | POA: Diagnosis not present

## 2016-12-02 DIAGNOSIS — R069 Unspecified abnormalities of breathing: Secondary | ICD-10-CM | POA: Diagnosis not present

## 2016-12-02 NOTE — Patient Outreach (Signed)
[facility]) Care Management   12/02/2016  EMMALEIGH RO [birth year] [id number]  Jennifer [name] is an 55 y.o. female 10:00  Arrived for scheduled home visit. Difficulty getting patient to the door. States she took a sleeping pill last night and is sleepy. Subjective: Patient recently discharged home from hospital for COPD.  Reports her breathing is better. Will finish her antibiotics and prednisone today.  States that she has not checked her CBG today.  Reports that she does not follow her diet. States that she takes all her medications as prescribed. Denies any problems with taking her medications.  Reports her main issue is her DM. Denies weighing daily. Reports that she takes her lasix as needed for swelling.  Patient reports that she is depressed and her medication is not helping.  Reports that she uses [facility] for transportation to MD appointments.  Patient reports that she having problems in the home with roaches and needs assistance with this.   Objective:  Rolling around in home in wheelchair.  Home has roaches crawling on the walls.  Wearing a wooden shoe on the right foot.  Vitals:   12/02/16 1029  BP: 138/70  Pulse: 64  Resp: 18  SpO2: 95%  Weight: 275 lb (124.7 kg)  Height: 1.702 m (5\' 7" )   Review of Systems  Constitutional: Positive for malaise/fatigue.       Reports taking a sleeping pill last night  Respiratory: Positive for cough, shortness of breath and wheezing.   Cardiovascular: Positive for leg swelling.  Gastrointestinal: Positive for diarrhea.  Genitourinary: Negative.   Musculoskeletal: Positive for joint pain.  Skin:       Reports wound on the right baby toe. Reports home health nurse evaluates several times per week.  Reports home health nurse will bring her some supplies.  Neurological: Negative.   Psychiatric/Behavioral: Positive for depression.    Physical Exam  Constitutional: She is oriented to person, place, and time. She appears  well-developed and well-nourished.  Cardiovascular: Normal rate and normal heart sounds.   Respiratory: Effort normal and breath sounds normal.  Lungs clear  GI: Soft. Bowel sounds are normal.  Musculoskeletal: She exhibits edema.  Right leg swollen.  Neurological: She is alert and oriented to person, place, and time.  sleepy  Skin: Skin is warm and dry.  Wearing wooden shoe to the right foot. Dressing intact from home health nurse.  Wound not assessed.   Goes to the wound clinic on 12/08/2016.   Psychiatric: She has a normal mood and affect. Her behavior is normal. Judgment and thought content normal.    Encounter Medications:   Outpatient Encounter Prescriptions as of 12/02/2016  Medication Sig Note  . ADVAIR DISKUS 250-50 MCG/DOSE AEPB TAKE 1 INHALATION BY MOUTH TWICE DAILY   . albuterol (PROVENTIL) (2.5 MG/3ML) 0.083% nebulizer solution INHALE THE CONTENTS OF 1 VIAL VIA NEBULIZER EVERY 6 HOURS AS NEEDED FOR WHEEZING 11/28/2016: Nebulizer has been broken for 4-5 months per pt  . amLODipine (NORVASC) 10 MG tablet TAKE 1 TABLET BY MOUTH EVERY DAY   . atenolol (TENORMIN) 100 MG tablet Take 1 tablet (100 mg total) by mouth daily.   Marland Kitchen azithromycin (ZITHROMAX) 250 MG tablet Take 1 tablet (250 mg total) by mouth daily.   . baclofen (LIORESAL) 10 MG tablet Take 1 tablet (10 mg total) by mouth 3 (three) times daily as needed for muscle spasms. Reported on 06/27/2016   . diphenhydrAMINE (BENADRYL) 25 MG tablet Take 2 tablets (50  mg total) by mouth at bedtime as needed. (Patient taking differently: Take 50 mg by mouth at bedtime. )   . EASY TOUCH PEN NEEDLES 31G X 8 MM MISC USE TO INJECT INSULIN TWICE DAILY   . fluticasone (FLONASE) 50 MCG/ACT nasal spray PLACE 1 SPRAY INTO BOTH NOSTRILS DAILY (Patient taking differently: PLACE 1 SPRAY INTO BOTH NOSTRILS DAILY AS NEEDED FOR CONGESTION)   . furosemide (LASIX) 40 MG tablet Take 1 tablet (40 mg total) by mouth daily as needed.   . hydrochlorothiazide  (HYDRODIURIL) 25 MG tablet Take 1 tablet (25 mg total) by mouth daily. 11/28/2016: Pt states she stopped after last Monday because of frequent urination  . Insulin Lispro Prot & Lispro (HUMALOG MIX 75/25 KWIKPEN) (75-25) 100 UNIT/ML Kwikpen INJECT 135 UNITS SUBCUTANEOUSLY IN THE MORNING BEFORE BREAKFAST & INJECT 85 UNITS SUBCUTANEOUSLY IN THE EVENING BEFORE EVENING MEAL (Patient taking differently: Inject 85-140 Units into the skin See admin instructions. Inject 140 units subcutaneously before breakfast and 85 units before supper)   . ipratropium (ATROVENT) 0.02 % nebulizer solution USE 1 VIAL VIA NEBULIZER EVERY 6 HOURS AS NEEDED FOR WHEEZING 11/28/2016: Nebulizer has been broken for 4-5 months per pt  . LYRICA 200 MG capsule TAKE 1 CAPSULE BY MOUTH THREE TIMES A DAY   . metFORMIN (GLUCOPHAGE XR) 500 MG 24 hr tablet Take 1 tablet (500 mg total) by mouth daily with breakfast.   . nystatin (MYCOSTATIN) 100000 UNIT/ML suspension Take 5 mLs (500,000 Units total) by mouth 4 (four) times daily.   Marland Kitchen nystatin (MYCOSTATIN) powder APPLY TO AFFECTED AREA THREE TIMES A DAY   . ONETOUCH DELICA LANCETS 99991111 MISC USE TO CHECK BLOOD SUGARS FOUR TIMES A DAY   . ONETOUCH VERIO test strip USE 3-4 TIMES DAILY TO CHECK BLOOD SUGAR   . oxyCODONE-acetaminophen (PERCOCET/ROXICET) 5-325 MG tablet Take 1-2 tablets by mouth every 4 (four) hours as needed for severe pain.   . pantoprazole (PROTONIX) 40 MG tablet TAKE 1 TABLET (40 MG TOTAL) BY MOUTH DAILY.   Marland Kitchen potassium chloride SA (K-DUR,KLOR-CON) 20 MEQ tablet TAKE 2 TABLETS BY MOUTH DAILY   . predniSONE (DELTASONE) 20 MG tablet Take 2 tablets (40 mg total) by mouth daily with breakfast.   . PROAIR HFA 108 (90 Base) MCG/ACT inhaler INHALE 2 PUFFS BY MOUTH EVERY 6 HOURS AS NEEDED FOR WHEEZING (Patient taking differently: INHALE 2-4 PUFFS BY MOUTH EVERY 3 HOURS AS NEEDED FOR WHEEZING)   . ranitidine (ZANTAC) 150 MG tablet Take 1 tablet (150 mg total) by mouth 2 (two) times daily.  11/28/2016: [facility]  . rosuvastatin (CRESTOR) 20 MG tablet Take 20 mg by mouth at bedtime.   . sertraline (ZOLOFT) 100 MG tablet Take 1 tablet (100 mg total) by mouth daily.   . traZODone (DESYREL) 100 MG tablet TAKE 1 TABLET BY MOUTH DAILY AT BEDTIME AS NEEDED FOR SLEEP (Patient taking differently: Take 100 mg by mouth at bedtime as needed for sleep. )   . benazepril (LOTENSIN) 40 MG tablet Take 40 mg by mouth daily.   . benzonatate (TESSALON PERLES) 100 MG capsule Take 1 capsule (100 mg total) by mouth every 6 (six) hours as needed for cough. (Patient not taking: Reported on 12/02/2016)   . lidocaine (LIDODERM) 5 % Place 1 patch onto the skin daily. Remove & Discard patch within 12 hours or as directed by MD (Patient not taking: Reported on 12/02/2016)   . varenicline (CHANTIX) 0.5 MG tablet Take one pill  once a day for 3 days. Then one pill twice a day for 4 days. Then quit smoking and go to higher dose (Patient not taking: Reported on 12/02/2016)   . varenicline (CHANTIX) 1 MG tablet Take 1 tablet (1 mg total) by mouth 2 (two) times daily. Take this after you complete the 0.5 mg dose (Patient not taking: Reported on 12/02/2016)    No facility-administered encounter medications on file as of 12/02/2016.     Functional Status:   In your present state of health, do you have any difficulty performing the following activities: 12/02/2016 11/28/2016  Hearing? [name]  Vision? Y N  Difficulty concentrating or making decisions? N N  Walking or climbing stairs? Y Y  Dressing or bathing? N N  Doing errands, shopping? [name]  Preparing Food and eating ? N -  Using the Toilet? N -  In the past six months, have you accidently leaked urine? Y -  Do you have problems with loss of bowel control? Y -  Managing your Medications? N -  Managing your Finances? N -  Housekeeping or managing your Housekeeping? N -  Some recent data might be hidden    Fall/Depression Screening:     PHQ 2/9 Scores 12/02/2016 11/28/2016 08/28/2016 06/30/2016 06/27/2016 06/05/2016 02/28/2016  PHQ - 2 Score 6 3 3 6 6 1 4   PHQ- 9 Score 14 12 11 13 13  - 9   Fall Risk  12/02/2016 11/28/2016 08/28/2016 06/30/2016 06/27/2016  Falls in the past year? Yes Yes Yes Yes -  Number falls in past yr: 2 or more 2 or more 2 or more 2 or more -  Injury with Fall? No No Yes Yes -  Risk Factor Category  High Fall Risk High Fall Risk - High Fall Risk -  Risk for fall due to : Impaired balance/gait History of fall(s);Impaired mobility - - History of fall(s);Impaired mobility  Risk for fall due to (comments): - - - - -  Follow up Falls prevention discussed Falls prevention discussed - - -   Assessment:   (1) reviewed [facility] transition of care program.  Provided new patient packet and my contact card. Provided [facility] calendar. (2) Hospital follow up planned for 12/05/2016. Reports she is use [facility]. (3) has all medications and takes as prescribed. (4)positive depression screening. (5) no advanced directives. (6) not follow DM diet. Reports that she knows what to eat and not to eat but is not motivated to follow diet.  (7) reported wound to the right baby toe. Dressing on during home visit. Follow by advanced home health.  (8) recent admission for COPD. Continues to smoke. (9) home with roaches. Patient reports that she was spraying bug killer last night and it took her breath away and she had to call EMS. Reports they gave her a breathing treatment and then she was better.   Plan:  (1) denies any changes needed to consent.  Reviewed weekly outreaches to patient. Reviewed 24 hour call a nurse line. (2)encouraged patient to attend follow up appointment with MD. (3) encouraged patient to take all medications as prescribed. (4) Will notify MD via this note. Referral placed to [place] worker to assist with counseling.  Patient in agreement for counseling. (5) Patient declines information on advance directives. States that she  already has a packet. (6)offered DM education and patient has declined. (7)Encouraged patient to follow plan from home health nurse and wound center. Encouraged patient to call MD for any changes in  condition or concerns. (8) reviewed COPD zones and importance of early recognition of symptoms and treatment.  Encouraged patient to stop smoking and avoid triggers that make her COPD worse.  (9) referral to [place] worker to assist patient with roach concerns in the apartment and with land lord.   Care planning and goal setting during home visit. Primary goal to avoid readmission. Telephone follow up planned in 1 week. Reviewed with patient and she is in agreement to telephone follow up. This note sent to primary MD.  [facility] CM Care Plan Problem One   Flowsheet Row Most Recent Value  Care Plan Problem One  Recent hospital admission for COPD  Role Documenting the Problem One  Care Management [place] for Problem One  Active  [facility] Long Term Goal (31-90 days)  Patient will report no readmission related to COPD in the next 60 days.  [facility] Long Term Goal Start Date  12/01/16  Interventions for Problem One Long Term Goal  home visit completed.   [facility] CM Short Term Goal #1 (0-30 days)  Patient will report attending hospital follow up within the next 10 days.    [facility] CM Short Term Goal #1 Start Date  12/01/16  Interventions for Short Term Goal #1  Ecouraged patient to attend all MD appointments.  [facility] CM Short Term Goal #2 (0-30 days)  Patient will report taking all her medications as prescribed in the next 30 days.  [facility] CM Short Term Goal #2 Start Date  12/01/16  Interventions for Short Term Goal #2  Reviewed importance of taking all medications as prescribed.       Tomasa Rand, RN, BSN, CEN [facility] [employer] [phone]

## 2016-12-03 ENCOUNTER — Telehealth: Payer: Self-pay

## 2016-12-03 NOTE — Telephone Encounter (Signed)
Jennifer [name] from [facility] at home requesting VO for PT. Please call back.

## 2016-12-04 ENCOUNTER — Encounter: Payer: Self-pay | Admitting: *Deleted

## 2016-12-04 ENCOUNTER — Telehealth: Payer: Self-pay | Admitting: Internal Medicine

## 2016-12-04 DIAGNOSIS — E1151 Type 2 diabetes mellitus with diabetic peripheral angiopathy without gangrene: Secondary | ICD-10-CM | POA: Diagnosis not present

## 2016-12-04 DIAGNOSIS — I110 Hypertensive heart disease with heart failure: Secondary | ICD-10-CM | POA: Diagnosis not present

## 2016-12-04 DIAGNOSIS — G894 Chronic pain syndrome: Secondary | ICD-10-CM | POA: Diagnosis not present

## 2016-12-04 DIAGNOSIS — E11621 Type 2 diabetes mellitus with foot ulcer: Secondary | ICD-10-CM | POA: Diagnosis not present

## 2016-12-04 DIAGNOSIS — L97511 Non-pressure chronic ulcer of other part of right foot limited to breakdown of skin: Secondary | ICD-10-CM | POA: Diagnosis not present

## 2016-12-04 DIAGNOSIS — E1142 Type 2 diabetes mellitus with diabetic polyneuropathy: Secondary | ICD-10-CM | POA: Diagnosis not present

## 2016-12-04 DIAGNOSIS — E785 Hyperlipidemia, unspecified: Secondary | ICD-10-CM | POA: Diagnosis not present

## 2016-12-04 DIAGNOSIS — M797 Fibromyalgia: Secondary | ICD-10-CM | POA: Diagnosis not present

## 2016-12-04 DIAGNOSIS — E1159 Type 2 diabetes mellitus with other circulatory complications: Secondary | ICD-10-CM | POA: Diagnosis not present

## 2016-12-04 DIAGNOSIS — E1121 Type 2 diabetes mellitus with diabetic nephropathy: Secondary | ICD-10-CM | POA: Diagnosis not present

## 2016-12-04 DIAGNOSIS — E11319 Type 2 diabetes mellitus with unspecified diabetic retinopathy without macular edema: Secondary | ICD-10-CM | POA: Diagnosis not present

## 2016-12-04 DIAGNOSIS — I5032 Chronic diastolic (congestive) heart failure: Secondary | ICD-10-CM | POA: Diagnosis not present

## 2016-12-04 DIAGNOSIS — J441 Chronic obstructive pulmonary disease with (acute) exacerbation: Secondary | ICD-10-CM | POA: Diagnosis not present

## 2016-12-04 NOTE — Telephone Encounter (Signed)
APT. REMINDER CALL, LMTCB °

## 2016-12-05 ENCOUNTER — Ambulatory Visit: Payer: Medicare Other

## 2016-12-05 NOTE — Telephone Encounter (Signed)
Returned call to  [place], PT at [facility] - requesting verbal order for " PT 2 times a week x 4 weeks"; VO given. If not appropriate let me know. Thanks

## 2016-12-08 ENCOUNTER — Encounter (HOSPITAL_BASED_OUTPATIENT_CLINIC_OR_DEPARTMENT_OTHER): Payer: Medicare Other | Attending: Internal Medicine

## 2016-12-08 DIAGNOSIS — I5032 Chronic diastolic (congestive) heart failure: Secondary | ICD-10-CM | POA: Diagnosis not present

## 2016-12-08 DIAGNOSIS — F17210 Nicotine dependence, cigarettes, uncomplicated: Secondary | ICD-10-CM | POA: Insufficient documentation

## 2016-12-08 DIAGNOSIS — L97511 Non-pressure chronic ulcer of other part of right foot limited to breakdown of skin: Secondary | ICD-10-CM | POA: Diagnosis not present

## 2016-12-08 DIAGNOSIS — E785 Hyperlipidemia, unspecified: Secondary | ICD-10-CM | POA: Diagnosis not present

## 2016-12-08 DIAGNOSIS — I87321 Chronic venous hypertension (idiopathic) with inflammation of right lower extremity: Secondary | ICD-10-CM | POA: Insufficient documentation

## 2016-12-08 DIAGNOSIS — J449 Chronic obstructive pulmonary disease, unspecified: Secondary | ICD-10-CM | POA: Diagnosis not present

## 2016-12-08 DIAGNOSIS — I110 Hypertensive heart disease with heart failure: Secondary | ICD-10-CM | POA: Diagnosis not present

## 2016-12-08 DIAGNOSIS — M797 Fibromyalgia: Secondary | ICD-10-CM | POA: Diagnosis not present

## 2016-12-08 DIAGNOSIS — E11621 Type 2 diabetes mellitus with foot ulcer: Secondary | ICD-10-CM | POA: Insufficient documentation

## 2016-12-08 DIAGNOSIS — E1142 Type 2 diabetes mellitus with diabetic polyneuropathy: Secondary | ICD-10-CM | POA: Diagnosis not present

## 2016-12-08 DIAGNOSIS — L97516 Non-pressure chronic ulcer of other part of right foot with bone involvement without evidence of necrosis: Secondary | ICD-10-CM | POA: Diagnosis not present

## 2016-12-08 DIAGNOSIS — E1140 Type 2 diabetes mellitus with diabetic neuropathy, unspecified: Secondary | ICD-10-CM | POA: Diagnosis not present

## 2016-12-08 DIAGNOSIS — I509 Heart failure, unspecified: Secondary | ICD-10-CM | POA: Insufficient documentation

## 2016-12-08 DIAGNOSIS — M069 Rheumatoid arthritis, unspecified: Secondary | ICD-10-CM | POA: Insufficient documentation

## 2016-12-08 DIAGNOSIS — E1151 Type 2 diabetes mellitus with diabetic peripheral angiopathy without gangrene: Secondary | ICD-10-CM | POA: Diagnosis not present

## 2016-12-08 DIAGNOSIS — J441 Chronic obstructive pulmonary disease with (acute) exacerbation: Secondary | ICD-10-CM | POA: Diagnosis not present

## 2016-12-08 DIAGNOSIS — E11319 Type 2 diabetes mellitus with unspecified diabetic retinopathy without macular edema: Secondary | ICD-10-CM | POA: Diagnosis not present

## 2016-12-08 DIAGNOSIS — G894 Chronic pain syndrome: Secondary | ICD-10-CM | POA: Diagnosis not present

## 2016-12-08 DIAGNOSIS — R601 Generalized edema: Secondary | ICD-10-CM | POA: Diagnosis not present

## 2016-12-08 DIAGNOSIS — E1121 Type 2 diabetes mellitus with diabetic nephropathy: Secondary | ICD-10-CM | POA: Diagnosis not present

## 2016-12-08 DIAGNOSIS — E1159 Type 2 diabetes mellitus with other circulatory complications: Secondary | ICD-10-CM | POA: Diagnosis not present

## 2016-12-08 DIAGNOSIS — L84 Corns and callosities: Secondary | ICD-10-CM | POA: Diagnosis not present

## 2016-12-08 NOTE — Telephone Encounter (Signed)
VO OK with me. Thanks.

## 2016-12-09 ENCOUNTER — Other Ambulatory Visit: Payer: Self-pay

## 2016-12-09 NOTE — Patient Outreach (Signed)
Transition of care: Placed call to patient for weekly transition of care. No answer. Left a message requesting a call back for follow up.  PLAN: will wait for a return call. Will continue to outreach patient weekly. Tomasa Rand, RN, BSN, CEN [facility] [employer] [phone]

## 2016-12-10 ENCOUNTER — Other Ambulatory Visit: Payer: Self-pay | Admitting: Nurse Practitioner

## 2016-12-10 ENCOUNTER — Ambulatory Visit: Payer: Medicare Other

## 2016-12-10 DIAGNOSIS — E11621 Type 2 diabetes mellitus with foot ulcer: Secondary | ICD-10-CM

## 2016-12-10 DIAGNOSIS — M869 Osteomyelitis, unspecified: Principal | ICD-10-CM

## 2016-12-10 DIAGNOSIS — L97509 Non-pressure chronic ulcer of other part of unspecified foot with unspecified severity: Principal | ICD-10-CM

## 2016-12-10 DIAGNOSIS — E1169 Type 2 diabetes mellitus with other specified complication: Principal | ICD-10-CM

## 2016-12-11 ENCOUNTER — Other Ambulatory Visit: Payer: Self-pay | Admitting: *Deleted

## 2016-12-11 DIAGNOSIS — J441 Chronic obstructive pulmonary disease with (acute) exacerbation: Secondary | ICD-10-CM | POA: Diagnosis not present

## 2016-12-11 DIAGNOSIS — E1151 Type 2 diabetes mellitus with diabetic peripheral angiopathy without gangrene: Secondary | ICD-10-CM | POA: Diagnosis not present

## 2016-12-11 DIAGNOSIS — L97511 Non-pressure chronic ulcer of other part of right foot limited to breakdown of skin: Secondary | ICD-10-CM | POA: Diagnosis not present

## 2016-12-11 DIAGNOSIS — I5032 Chronic diastolic (congestive) heart failure: Secondary | ICD-10-CM | POA: Diagnosis not present

## 2016-12-11 DIAGNOSIS — E785 Hyperlipidemia, unspecified: Secondary | ICD-10-CM | POA: Diagnosis not present

## 2016-12-11 DIAGNOSIS — E11621 Type 2 diabetes mellitus with foot ulcer: Secondary | ICD-10-CM | POA: Diagnosis not present

## 2016-12-11 DIAGNOSIS — M797 Fibromyalgia: Secondary | ICD-10-CM | POA: Diagnosis not present

## 2016-12-11 DIAGNOSIS — E1142 Type 2 diabetes mellitus with diabetic polyneuropathy: Secondary | ICD-10-CM | POA: Diagnosis not present

## 2016-12-11 DIAGNOSIS — I110 Hypertensive heart disease with heart failure: Secondary | ICD-10-CM | POA: Diagnosis not present

## 2016-12-11 DIAGNOSIS — E11319 Type 2 diabetes mellitus with unspecified diabetic retinopathy without macular edema: Secondary | ICD-10-CM | POA: Diagnosis not present

## 2016-12-11 DIAGNOSIS — E1121 Type 2 diabetes mellitus with diabetic nephropathy: Secondary | ICD-10-CM | POA: Diagnosis not present

## 2016-12-11 DIAGNOSIS — E1159 Type 2 diabetes mellitus with other circulatory complications: Secondary | ICD-10-CM | POA: Diagnosis not present

## 2016-12-11 DIAGNOSIS — G894 Chronic pain syndrome: Secondary | ICD-10-CM | POA: Diagnosis not present

## 2016-12-11 NOTE — Patient Outreach (Signed)
[facility]) Care Management  12/11/2016  KAYDIN VASSAR [birth year] [id number]  CSW received a new referral on patient from patient's RNCM with [name] Management, Tomasa Rand indicating that patient would benefit from social work services and resources.  [name] went on to say that patient needs assistance with obtaining assistance with the roach infestation in her apartment.  In addition, patient is requesting resources and a possible referral for dealing with symptoms of Depression. CSW made an initial attempt to try and contact patient today to perform phone assessment, as well as assess and assist with social needs and services, without success.  A HIPAA complaint message was left for patient on voicemail.  CSW is currently awaiting a return call. CSW will make a second outreach attempt in one week, if CSW does not receive a return call from patient in the meantime. Nat Christen, BSW, MSW, LCSW  Licensed Education officer, environmental Health System  Mailing [place] [address], [place], [place] [address] Physical Address-[address], [place], [place] [address] Toll Free Main # [phone] Fax # [phone] Cell # [phone]  Fax # [phone]  [email or web address]

## 2016-12-12 ENCOUNTER — Ambulatory Visit (INDEPENDENT_AMBULATORY_CARE_PROVIDER_SITE_OTHER): Payer: Medicare Other | Admitting: Internal Medicine

## 2016-12-12 VITALS — BP 149/63 | HR 73 | Temp 98.1°F | Ht 67.0 in | Wt 275.0 lb

## 2016-12-12 DIAGNOSIS — Z79891 Long term (current) use of opiate analgesic: Secondary | ICD-10-CM

## 2016-12-12 DIAGNOSIS — J441 Chronic obstructive pulmonary disease with (acute) exacerbation: Secondary | ICD-10-CM | POA: Diagnosis not present

## 2016-12-12 DIAGNOSIS — E11621 Type 2 diabetes mellitus with foot ulcer: Secondary | ICD-10-CM | POA: Diagnosis not present

## 2016-12-12 DIAGNOSIS — E1151 Type 2 diabetes mellitus with diabetic peripheral angiopathy without gangrene: Secondary | ICD-10-CM | POA: Diagnosis not present

## 2016-12-12 DIAGNOSIS — Z5189 Encounter for other specified aftercare: Secondary | ICD-10-CM

## 2016-12-12 DIAGNOSIS — J449 Chronic obstructive pulmonary disease, unspecified: Secondary | ICD-10-CM | POA: Diagnosis not present

## 2016-12-12 DIAGNOSIS — E1159 Type 2 diabetes mellitus with other circulatory complications: Secondary | ICD-10-CM | POA: Diagnosis not present

## 2016-12-12 DIAGNOSIS — I10 Essential (primary) hypertension: Secondary | ICD-10-CM

## 2016-12-12 DIAGNOSIS — E1121 Type 2 diabetes mellitus with diabetic nephropathy: Secondary | ICD-10-CM | POA: Diagnosis not present

## 2016-12-12 DIAGNOSIS — E785 Hyperlipidemia, unspecified: Secondary | ICD-10-CM | POA: Diagnosis not present

## 2016-12-12 DIAGNOSIS — F1120 Opioid dependence, uncomplicated: Secondary | ICD-10-CM

## 2016-12-12 DIAGNOSIS — E11319 Type 2 diabetes mellitus with unspecified diabetic retinopathy without macular edema: Secondary | ICD-10-CM | POA: Diagnosis not present

## 2016-12-12 DIAGNOSIS — I152 Hypertension secondary to endocrine disorders: Secondary | ICD-10-CM

## 2016-12-12 DIAGNOSIS — Z794 Long term (current) use of insulin: Secondary | ICD-10-CM

## 2016-12-12 DIAGNOSIS — I5032 Chronic diastolic (congestive) heart failure: Secondary | ICD-10-CM | POA: Diagnosis not present

## 2016-12-12 DIAGNOSIS — I110 Hypertensive heart disease with heart failure: Secondary | ICD-10-CM | POA: Diagnosis not present

## 2016-12-12 DIAGNOSIS — M797 Fibromyalgia: Secondary | ICD-10-CM | POA: Diagnosis not present

## 2016-12-12 DIAGNOSIS — Z79899 Other long term (current) drug therapy: Secondary | ICD-10-CM

## 2016-12-12 DIAGNOSIS — F17210 Nicotine dependence, cigarettes, uncomplicated: Secondary | ICD-10-CM

## 2016-12-12 DIAGNOSIS — G894 Chronic pain syndrome: Secondary | ICD-10-CM | POA: Diagnosis not present

## 2016-12-12 DIAGNOSIS — E1142 Type 2 diabetes mellitus with diabetic polyneuropathy: Secondary | ICD-10-CM | POA: Diagnosis not present

## 2016-12-12 DIAGNOSIS — L97511 Non-pressure chronic ulcer of other part of right foot limited to breakdown of skin: Secondary | ICD-10-CM | POA: Diagnosis not present

## 2016-12-12 LAB — GLUCOSE, CAPILLARY: Glucose-Capillary: 123 mg/dL — ABNORMAL HIGH (ref 65–99)

## 2016-12-12 MED ORDER — OXYCODONE-ACETAMINOPHEN 5-325 MG PO TABS: 1.0000 | ORAL_TABLET | ORAL | 0 refills | Status: DC | PRN

## 2016-12-12 NOTE — Patient Instructions (Addendum)
STOP taking irbesartan.   For your blood pressure you should only be taking:  1. Lasix 2. Benazepril  3. norvasc 4. Hydrochlorothiazide

## 2016-12-12 NOTE — Assessment & Plan Note (Addendum)
Assessment: Patient's blood pressure was 160/65 on arrival. Repeat blood pressure was 149/63. Patient is having right foot pain from infection for which she sees wound care for. Patient has been refilling irbesartan even though this was discontinued on discharge from hospital this month. Patient's blood pressure likely elevated in setting of pain.   Plan: Given instructions on which blood pressure medication she should be on. Which are Norvasc, benazepril, Lasix, and hydrochlorothiazide. Patient has follow-up in one month.

## 2016-12-12 NOTE — Progress Notes (Signed)
CC: COPD  HPI:  Jennifer [name] is a 55 y.o. with PMHx as outlined below who presents to clinic for COPD follow up. Please see problem list for further details of patient's chronic medical issues.    Past Medical History:  Diagnosis Date  . Anginal pain [facility])    '3' of 10 ischemia ruled out 9/9   . 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 Asthma   . CHF (congestive heart failure) ([place])   . 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 Chronic bronchitis ([place])    "I get it alot" (09/28/2013)  . Chronic diastolic heart failure (HCC)    grade 2 per 2D echocardiogram (01/2013)  . Chronic lower back pain   . Chronic osteomyelitis of foot (HCC)    chronic, right secondary to diabetic foot ulcers  . Chronic pain syndrome 12/03/2011   Likely secondary to depression, "fibromyalgia", neuropathy, and obesity. Lumbar MRI 2014 no sig change from prior (2008) : Stable hypertrophic facet disease most notable at L4-5. Stable shallow left foraminal/extraforaminal disc protrusion at L4-5. No direct neural compression.      Marland Kitchen COPD 01/08/2007   PFT's 05/2007 : FEV1/FVC 82, FEV1 64% pred, FEF 25-75% 40% predicted, 16% improvement in FEV1 with bronchodilators.     . Depression   . Diabetic peripheral neuropathy ([place])   . DVT of upper extremity (deep vein thrombosis) ([place]) 03/11/2013   Secondary to PICC line. Right brachial vein, diagnosed on 03/10/2013 Coumadin for 3 months. End date 06/10/2013   . Environmental allergies    Hx: of  . Exertional shortness of breath   . Fatty liver 2003   observed on ultrasound abdomen  . Fibromyalgia   . GERD (gastroesophageal reflux disease)   . Glaucoma   . Hyperlipidemia   . Hyperplastic colon polyp 12/2010   Per colonoscopy (12/2010) - Dr. Deatra Ina  . Hypertension   . 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 Blood cultures 01/26/13 S. Aureus and GBS. Blood cultures Feb 6th, 8th, and 9th and March  were negative.Repeat TEE 3/20 negative for vegitations  . 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 2/2 tobacco use, DM, and cont weight bearing on surgical wound and developed gangrene   . Polymicrobial bacterial infection 01/2013   GBS and S. aureus bacteremia // Source likely infected diabetic foot ulcer  . PVD (peripheral vascular disease) with claudication ([place])    Stents to bilateral common iliac arteries (left 2005, right 2008), on chronic plavix  . Rheumatoid arthritis ([place])   . Tobacco abuse   . Type II diabetes mellitus with peripheral circulatory disorders, uncontrolled DX: 1993   Insulin dep. Poor control. Complicated by diabetic foot ulcer and diabetic eye disease.    Marland Kitchen Ulcer of foot, chronic (HCC)    Left. No OM per MRI (01/2013)    Review of Systems:  Denies fevers, NS, chills. Having rt foot pain.   Physical Exam:  Vitals:   12/12/16 1023 12/12/16 1044  BP: (!) 160/65 (!) 149/63  Pulse: 73   Temp: 98.1 F (36.7 C)   TempSrc: Oral   SpO2: 93%   Weight: 275 lb (124.7 kg)   Height: 5\' 7"  (1.702 m)    Physical Exam  Constitutional:  appears well-developed and well-nourished. No distress.  HENT:  Head: Normocephalic and atraumatic.  Nose: Nose normal.  Cardiovascular: Normal rate, regular rhythm and normal heart sounds.  Exam reveals no gallop and no friction rub.   No murmur heard. Pulmonary/Chest: Effort normal and breath sounds normal. No respiratory distress.  has no wheezes.no rales.  Abdominal: Soft. Bowel sounds are normal.  exhibits no distension. There is no tenderness. There is no rebound and no guarding.   Assessment & Plan:   See Encounters Tab for problem based charting.  Patient discussed with Dr. Daryll Drown

## 2016-12-12 NOTE — Assessment & Plan Note (Signed)
Assessment: Patient CBG this morning is 123. She is on 75/25 140 units in the morning and 85 units in the evening as well as metformin 500 mg daily. Glucometer report reveals 1 low reading of 57 in the evening with highest noted as too high to read.  Plan: Continue current regimen follow-up in one month with her PCP.

## 2016-12-12 NOTE — Assessment & Plan Note (Signed)
Assessment: Patient admitted to the hospital for COPD exacerbation on December 9. She reports she has been doing well since discharge. She finished her course of azithromycin and prednisone  Plan: Satting well on room air, lungs clear to auscultation bilaterally. COPD exacerbation resolved

## 2016-12-12 NOTE — Assessment & Plan Note (Signed)
At end of exam patient requesting refill of oxycodone. She is not due until January 6 per patient's report. She has financial and transportation difficulties as requesting a refill given today. One refill given today she hasn't appointment with Dr. [name] on January 11 and she was advised to get the rest of her refills at that visit.

## 2016-12-13 DIAGNOSIS — E1159 Type 2 diabetes mellitus with other circulatory complications: Secondary | ICD-10-CM | POA: Diagnosis not present

## 2016-12-13 DIAGNOSIS — I110 Hypertensive heart disease with heart failure: Secondary | ICD-10-CM | POA: Diagnosis not present

## 2016-12-13 DIAGNOSIS — E11319 Type 2 diabetes mellitus with unspecified diabetic retinopathy without macular edema: Secondary | ICD-10-CM | POA: Diagnosis not present

## 2016-12-13 DIAGNOSIS — E1142 Type 2 diabetes mellitus with diabetic polyneuropathy: Secondary | ICD-10-CM | POA: Diagnosis not present

## 2016-12-13 DIAGNOSIS — L97511 Non-pressure chronic ulcer of other part of right foot limited to breakdown of skin: Secondary | ICD-10-CM | POA: Diagnosis not present

## 2016-12-13 DIAGNOSIS — E1121 Type 2 diabetes mellitus with diabetic nephropathy: Secondary | ICD-10-CM | POA: Diagnosis not present

## 2016-12-13 DIAGNOSIS — I5032 Chronic diastolic (congestive) heart failure: Secondary | ICD-10-CM | POA: Diagnosis not present

## 2016-12-13 DIAGNOSIS — E11621 Type 2 diabetes mellitus with foot ulcer: Secondary | ICD-10-CM | POA: Diagnosis not present

## 2016-12-13 DIAGNOSIS — G894 Chronic pain syndrome: Secondary | ICD-10-CM | POA: Diagnosis not present

## 2016-12-13 DIAGNOSIS — E785 Hyperlipidemia, unspecified: Secondary | ICD-10-CM | POA: Diagnosis not present

## 2016-12-13 DIAGNOSIS — M797 Fibromyalgia: Secondary | ICD-10-CM | POA: Diagnosis not present

## 2016-12-13 DIAGNOSIS — J441 Chronic obstructive pulmonary disease with (acute) exacerbation: Secondary | ICD-10-CM | POA: Diagnosis not present

## 2016-12-13 DIAGNOSIS — E1151 Type 2 diabetes mellitus with diabetic peripheral angiopathy without gangrene: Secondary | ICD-10-CM | POA: Diagnosis not present

## 2016-12-16 ENCOUNTER — Other Ambulatory Visit: Payer: Self-pay

## 2016-12-16 NOTE — Patient Outreach (Signed)
Transition of care call: Placed call to patient who reports that she is doing well. States that she continues to have pain in her foot. Reports home health nurse continues to provided wound care. Patient has a pending follow up planned at the wound center on 12/24/2016.  States her CBG today was 109. Patient completed hospital follow up last week.  Reviewed with patient that the [place] worker has been trying to reach her. She said she was unaware. I provided [facility] social worker contact number and ask for patient to return call. She agreed.  PLAN: will continue to follow for transition of care. Encouraged patient to call MD for any changes in condition.   Tomasa Rand, RN, BSN, CEN [facility] [employer] [phone]

## 2016-12-17 DIAGNOSIS — E1151 Type 2 diabetes mellitus with diabetic peripheral angiopathy without gangrene: Secondary | ICD-10-CM | POA: Diagnosis not present

## 2016-12-17 DIAGNOSIS — J441 Chronic obstructive pulmonary disease with (acute) exacerbation: Secondary | ICD-10-CM | POA: Diagnosis not present

## 2016-12-17 DIAGNOSIS — G894 Chronic pain syndrome: Secondary | ICD-10-CM | POA: Diagnosis not present

## 2016-12-17 DIAGNOSIS — I110 Hypertensive heart disease with heart failure: Secondary | ICD-10-CM | POA: Diagnosis not present

## 2016-12-17 DIAGNOSIS — E11319 Type 2 diabetes mellitus with unspecified diabetic retinopathy without macular edema: Secondary | ICD-10-CM | POA: Diagnosis not present

## 2016-12-17 DIAGNOSIS — E785 Hyperlipidemia, unspecified: Secondary | ICD-10-CM | POA: Diagnosis not present

## 2016-12-17 DIAGNOSIS — E1159 Type 2 diabetes mellitus with other circulatory complications: Secondary | ICD-10-CM | POA: Diagnosis not present

## 2016-12-17 DIAGNOSIS — E11621 Type 2 diabetes mellitus with foot ulcer: Secondary | ICD-10-CM | POA: Diagnosis not present

## 2016-12-17 DIAGNOSIS — E1142 Type 2 diabetes mellitus with diabetic polyneuropathy: Secondary | ICD-10-CM | POA: Diagnosis not present

## 2016-12-17 DIAGNOSIS — E1121 Type 2 diabetes mellitus with diabetic nephropathy: Secondary | ICD-10-CM | POA: Diagnosis not present

## 2016-12-17 DIAGNOSIS — I5032 Chronic diastolic (congestive) heart failure: Secondary | ICD-10-CM | POA: Diagnosis not present

## 2016-12-17 DIAGNOSIS — L97511 Non-pressure chronic ulcer of other part of right foot limited to breakdown of skin: Secondary | ICD-10-CM | POA: Diagnosis not present

## 2016-12-17 DIAGNOSIS — M797 Fibromyalgia: Secondary | ICD-10-CM | POA: Diagnosis not present

## 2016-12-17 NOTE — Progress Notes (Signed)
Internal Medicine Clinic Attending  Case discussed with Dr. Truong soon after the resident saw the patient.  We reviewed the resident's history and exam and pertinent patient test results.  I agree with the assessment, diagnosis, and plan of care documented in the resident's note. 

## 2016-12-18 ENCOUNTER — Telehealth: Payer: Self-pay | Admitting: *Deleted

## 2016-12-18 DIAGNOSIS — L97511 Non-pressure chronic ulcer of other part of right foot limited to breakdown of skin: Secondary | ICD-10-CM | POA: Diagnosis not present

## 2016-12-18 DIAGNOSIS — J441 Chronic obstructive pulmonary disease with (acute) exacerbation: Secondary | ICD-10-CM | POA: Diagnosis not present

## 2016-12-18 DIAGNOSIS — G894 Chronic pain syndrome: Secondary | ICD-10-CM | POA: Diagnosis not present

## 2016-12-18 DIAGNOSIS — E1159 Type 2 diabetes mellitus with other circulatory complications: Secondary | ICD-10-CM | POA: Diagnosis not present

## 2016-12-18 DIAGNOSIS — E1151 Type 2 diabetes mellitus with diabetic peripheral angiopathy without gangrene: Secondary | ICD-10-CM | POA: Diagnosis not present

## 2016-12-18 DIAGNOSIS — I110 Hypertensive heart disease with heart failure: Secondary | ICD-10-CM | POA: Diagnosis not present

## 2016-12-18 DIAGNOSIS — E11319 Type 2 diabetes mellitus with unspecified diabetic retinopathy without macular edema: Secondary | ICD-10-CM | POA: Diagnosis not present

## 2016-12-18 DIAGNOSIS — I5032 Chronic diastolic (congestive) heart failure: Secondary | ICD-10-CM | POA: Diagnosis not present

## 2016-12-18 DIAGNOSIS — M797 Fibromyalgia: Secondary | ICD-10-CM | POA: Diagnosis not present

## 2016-12-18 DIAGNOSIS — E11621 Type 2 diabetes mellitus with foot ulcer: Secondary | ICD-10-CM | POA: Diagnosis not present

## 2016-12-18 DIAGNOSIS — E1142 Type 2 diabetes mellitus with diabetic polyneuropathy: Secondary | ICD-10-CM | POA: Diagnosis not present

## 2016-12-18 DIAGNOSIS — E785 Hyperlipidemia, unspecified: Secondary | ICD-10-CM | POA: Diagnosis not present

## 2016-12-18 DIAGNOSIS — E1121 Type 2 diabetes mellitus with diabetic nephropathy: Secondary | ICD-10-CM | POA: Diagnosis not present

## 2016-12-18 NOTE — Telephone Encounter (Signed)
agree

## 2016-12-18 NOTE — Telephone Encounter (Signed)
[facility], Jennifer [name] calls and ask for VO for use of pulse oximetry , as needed due to functional mobility, VO approval given, do you agree?

## 2016-12-19 ENCOUNTER — Other Ambulatory Visit: Payer: Self-pay | Admitting: *Deleted

## 2016-12-19 ENCOUNTER — Ambulatory Visit: Payer: Self-pay | Admitting: *Deleted

## 2016-12-19 DIAGNOSIS — L97511 Non-pressure chronic ulcer of other part of right foot limited to breakdown of skin: Secondary | ICD-10-CM | POA: Diagnosis not present

## 2016-12-19 DIAGNOSIS — I110 Hypertensive heart disease with heart failure: Secondary | ICD-10-CM | POA: Diagnosis not present

## 2016-12-19 DIAGNOSIS — E11319 Type 2 diabetes mellitus with unspecified diabetic retinopathy without macular edema: Secondary | ICD-10-CM | POA: Diagnosis not present

## 2016-12-19 DIAGNOSIS — G894 Chronic pain syndrome: Secondary | ICD-10-CM | POA: Diagnosis not present

## 2016-12-19 DIAGNOSIS — J441 Chronic obstructive pulmonary disease with (acute) exacerbation: Secondary | ICD-10-CM | POA: Diagnosis not present

## 2016-12-19 DIAGNOSIS — E1151 Type 2 diabetes mellitus with diabetic peripheral angiopathy without gangrene: Secondary | ICD-10-CM | POA: Diagnosis not present

## 2016-12-19 DIAGNOSIS — E1159 Type 2 diabetes mellitus with other circulatory complications: Secondary | ICD-10-CM | POA: Diagnosis not present

## 2016-12-19 DIAGNOSIS — M797 Fibromyalgia: Secondary | ICD-10-CM | POA: Diagnosis not present

## 2016-12-19 DIAGNOSIS — E1121 Type 2 diabetes mellitus with diabetic nephropathy: Secondary | ICD-10-CM | POA: Diagnosis not present

## 2016-12-19 DIAGNOSIS — I5032 Chronic diastolic (congestive) heart failure: Secondary | ICD-10-CM | POA: Diagnosis not present

## 2016-12-19 DIAGNOSIS — E11621 Type 2 diabetes mellitus with foot ulcer: Secondary | ICD-10-CM | POA: Diagnosis not present

## 2016-12-19 DIAGNOSIS — E1142 Type 2 diabetes mellitus with diabetic polyneuropathy: Secondary | ICD-10-CM | POA: Diagnosis not present

## 2016-12-19 DIAGNOSIS — E785 Hyperlipidemia, unspecified: Secondary | ICD-10-CM | POA: Diagnosis not present

## 2016-12-19 NOTE — Patient Outreach (Signed)
[facility]) Care Management  12/19/2016  MAKAHLA JOBIN 1961/10/23 [id number]   CSW made a second attempt to try and contact patient today to perform phone assessment, as well as assess and assist with social needs and services, without success.  A HIPAA complaint message was left for patient on voicemail.  CSW is currently awaiting a return call.  CSW will make a third and final attempt to try and contact patient in one week, if CSW does not receive a return call from patient in the meantime. Nat Christen, BSW, MSW, LCSW  Licensed Education officer, environmental Health System  Mailing [place] [address], [place], [place] [address] Physical Address-[address], [place], [place] [address] Toll Free Main # [phone] Fax # [phone] Cell # [phone]  Fax # [phone]  [email or web address]

## 2016-12-21 ENCOUNTER — Other Ambulatory Visit: Payer: Self-pay | Admitting: Internal Medicine

## 2016-12-23 ENCOUNTER — Other Ambulatory Visit: Payer: Self-pay

## 2016-12-23 DIAGNOSIS — E1121 Type 2 diabetes mellitus with diabetic nephropathy: Secondary | ICD-10-CM | POA: Diagnosis not present

## 2016-12-23 DIAGNOSIS — E1151 Type 2 diabetes mellitus with diabetic peripheral angiopathy without gangrene: Secondary | ICD-10-CM | POA: Diagnosis not present

## 2016-12-23 DIAGNOSIS — I110 Hypertensive heart disease with heart failure: Secondary | ICD-10-CM | POA: Diagnosis not present

## 2016-12-23 DIAGNOSIS — M797 Fibromyalgia: Secondary | ICD-10-CM | POA: Diagnosis not present

## 2016-12-23 DIAGNOSIS — J441 Chronic obstructive pulmonary disease with (acute) exacerbation: Secondary | ICD-10-CM | POA: Diagnosis not present

## 2016-12-23 DIAGNOSIS — L97511 Non-pressure chronic ulcer of other part of right foot limited to breakdown of skin: Secondary | ICD-10-CM | POA: Diagnosis not present

## 2016-12-23 DIAGNOSIS — E785 Hyperlipidemia, unspecified: Secondary | ICD-10-CM | POA: Diagnosis not present

## 2016-12-23 DIAGNOSIS — E11621 Type 2 diabetes mellitus with foot ulcer: Secondary | ICD-10-CM | POA: Diagnosis not present

## 2016-12-23 DIAGNOSIS — I5032 Chronic diastolic (congestive) heart failure: Secondary | ICD-10-CM | POA: Diagnosis not present

## 2016-12-23 DIAGNOSIS — E11319 Type 2 diabetes mellitus with unspecified diabetic retinopathy without macular edema: Secondary | ICD-10-CM | POA: Diagnosis not present

## 2016-12-23 DIAGNOSIS — E1142 Type 2 diabetes mellitus with diabetic polyneuropathy: Secondary | ICD-10-CM | POA: Diagnosis not present

## 2016-12-23 DIAGNOSIS — G894 Chronic pain syndrome: Secondary | ICD-10-CM | POA: Diagnosis not present

## 2016-12-23 DIAGNOSIS — E1159 Type 2 diabetes mellitus with other circulatory complications: Secondary | ICD-10-CM | POA: Diagnosis not present

## 2016-12-23 NOTE — Patient Outreach (Signed)
Transition of care call: Placed call to patient who answered. Reports that she is doing well. States breathing is "ok".  Reports that she has not spoken to Education officer, museum. I again provided phone number and asked for patient to call.   Patient reports home monitoring CBG today of 109. ( which was the same reading she gave me last week)  PLAN: Will continue weekly transition of care calls. Denies any new problems or concerns.  Tomasa Rand, RN, BSN, CEN [facility] [employer] [phone]

## 2016-12-25 ENCOUNTER — Encounter (HOSPITAL_BASED_OUTPATIENT_CLINIC_OR_DEPARTMENT_OTHER): Payer: Medicare Other | Attending: Internal Medicine

## 2016-12-25 DIAGNOSIS — F17200 Nicotine dependence, unspecified, uncomplicated: Secondary | ICD-10-CM | POA: Insufficient documentation

## 2016-12-25 DIAGNOSIS — I87331 Chronic venous hypertension (idiopathic) with ulcer and inflammation of right lower extremity: Secondary | ICD-10-CM | POA: Insufficient documentation

## 2016-12-25 DIAGNOSIS — M869 Osteomyelitis, unspecified: Secondary | ICD-10-CM | POA: Insufficient documentation

## 2016-12-25 DIAGNOSIS — M009 Pyogenic arthritis, unspecified: Secondary | ICD-10-CM | POA: Insufficient documentation

## 2016-12-25 DIAGNOSIS — L97516 Non-pressure chronic ulcer of other part of right foot with bone involvement without evidence of necrosis: Secondary | ICD-10-CM | POA: Insufficient documentation

## 2016-12-25 DIAGNOSIS — E1165 Type 2 diabetes mellitus with hyperglycemia: Secondary | ICD-10-CM | POA: Insufficient documentation

## 2016-12-25 DIAGNOSIS — E1169 Type 2 diabetes mellitus with other specified complication: Secondary | ICD-10-CM | POA: Insufficient documentation

## 2016-12-25 DIAGNOSIS — E11621 Type 2 diabetes mellitus with foot ulcer: Secondary | ICD-10-CM | POA: Insufficient documentation

## 2016-12-26 ENCOUNTER — Encounter (HOSPITAL_COMMUNITY): Payer: Self-pay | Admitting: Radiology

## 2016-12-26 ENCOUNTER — Ambulatory Visit (HOSPITAL_COMMUNITY)
Admission: RE | Admit: 2016-12-26 | Discharge: 2016-12-26 | Disposition: A | Discharge status: Home / Self Care | Payer: Medicare Other | Source: Ambulatory Visit | Attending: Nurse Practitioner | Admitting: Nurse Practitioner

## 2016-12-26 DIAGNOSIS — E11319 Type 2 diabetes mellitus with unspecified diabetic retinopathy without macular edema: Secondary | ICD-10-CM | POA: Diagnosis not present

## 2016-12-26 DIAGNOSIS — I110 Hypertensive heart disease with heart failure: Secondary | ICD-10-CM | POA: Diagnosis not present

## 2016-12-26 DIAGNOSIS — E1142 Type 2 diabetes mellitus with diabetic polyneuropathy: Secondary | ICD-10-CM | POA: Diagnosis not present

## 2016-12-26 DIAGNOSIS — E1159 Type 2 diabetes mellitus with other circulatory complications: Secondary | ICD-10-CM | POA: Diagnosis not present

## 2016-12-26 DIAGNOSIS — E1169 Type 2 diabetes mellitus with other specified complication: Secondary | ICD-10-CM | POA: Diagnosis not present

## 2016-12-26 DIAGNOSIS — J441 Chronic obstructive pulmonary disease with (acute) exacerbation: Secondary | ICD-10-CM | POA: Diagnosis not present

## 2016-12-26 DIAGNOSIS — M868X7 Other osteomyelitis, ankle and foot: Secondary | ICD-10-CM | POA: Insufficient documentation

## 2016-12-26 DIAGNOSIS — M898X7 Other specified disorders of bone, ankle and foot: Secondary | ICD-10-CM | POA: Insufficient documentation

## 2016-12-26 DIAGNOSIS — G894 Chronic pain syndrome: Secondary | ICD-10-CM | POA: Diagnosis not present

## 2016-12-26 DIAGNOSIS — L97511 Non-pressure chronic ulcer of other part of right foot limited to breakdown of skin: Secondary | ICD-10-CM | POA: Diagnosis not present

## 2016-12-26 DIAGNOSIS — D1779 Benign lipomatous neoplasm of other sites: Secondary | ICD-10-CM | POA: Insufficient documentation

## 2016-12-26 DIAGNOSIS — E785 Hyperlipidemia, unspecified: Secondary | ICD-10-CM | POA: Diagnosis not present

## 2016-12-26 DIAGNOSIS — L97516 Non-pressure chronic ulcer of other part of right foot with bone involvement without evidence of necrosis: Secondary | ICD-10-CM | POA: Diagnosis present

## 2016-12-26 DIAGNOSIS — I5032 Chronic diastolic (congestive) heart failure: Secondary | ICD-10-CM | POA: Diagnosis not present

## 2016-12-26 DIAGNOSIS — L97519 Non-pressure chronic ulcer of other part of right foot with unspecified severity: Secondary | ICD-10-CM | POA: Diagnosis not present

## 2016-12-26 DIAGNOSIS — E11621 Type 2 diabetes mellitus with foot ulcer: Secondary | ICD-10-CM

## 2016-12-26 DIAGNOSIS — M797 Fibromyalgia: Secondary | ICD-10-CM | POA: Diagnosis not present

## 2016-12-26 DIAGNOSIS — E1151 Type 2 diabetes mellitus with diabetic peripheral angiopathy without gangrene: Secondary | ICD-10-CM | POA: Diagnosis not present

## 2016-12-26 DIAGNOSIS — L97509 Non-pressure chronic ulcer of other part of unspecified foot with unspecified severity: Secondary | ICD-10-CM

## 2016-12-26 DIAGNOSIS — E1121 Type 2 diabetes mellitus with diabetic nephropathy: Secondary | ICD-10-CM | POA: Diagnosis not present

## 2016-12-26 DIAGNOSIS — M869 Osteomyelitis, unspecified: Secondary | ICD-10-CM

## 2016-12-26 MED ORDER — GADOBENATE DIMEGLUMINE 529 MG/ML IV SOLN
20.0000 mL | Freq: Once | INTRAVENOUS | Status: AC | PRN
  Administered 2016-12-26: 20 mL via INTRAVENOUS

## 2016-12-29 ENCOUNTER — Other Ambulatory Visit: Payer: Self-pay | Admitting: *Deleted

## 2016-12-29 ENCOUNTER — Other Ambulatory Visit: Payer: Self-pay

## 2016-12-29 DIAGNOSIS — E11319 Type 2 diabetes mellitus with unspecified diabetic retinopathy without macular edema: Secondary | ICD-10-CM | POA: Diagnosis not present

## 2016-12-29 DIAGNOSIS — E11621 Type 2 diabetes mellitus with foot ulcer: Secondary | ICD-10-CM | POA: Diagnosis not present

## 2016-12-29 DIAGNOSIS — E1159 Type 2 diabetes mellitus with other circulatory complications: Secondary | ICD-10-CM | POA: Diagnosis not present

## 2016-12-29 DIAGNOSIS — I5032 Chronic diastolic (congestive) heart failure: Secondary | ICD-10-CM | POA: Diagnosis not present

## 2016-12-29 DIAGNOSIS — I110 Hypertensive heart disease with heart failure: Secondary | ICD-10-CM | POA: Diagnosis not present

## 2016-12-29 DIAGNOSIS — E1121 Type 2 diabetes mellitus with diabetic nephropathy: Secondary | ICD-10-CM | POA: Diagnosis not present

## 2016-12-29 DIAGNOSIS — G894 Chronic pain syndrome: Secondary | ICD-10-CM | POA: Diagnosis not present

## 2016-12-29 DIAGNOSIS — L97511 Non-pressure chronic ulcer of other part of right foot limited to breakdown of skin: Secondary | ICD-10-CM | POA: Diagnosis not present

## 2016-12-29 DIAGNOSIS — E1142 Type 2 diabetes mellitus with diabetic polyneuropathy: Secondary | ICD-10-CM | POA: Diagnosis not present

## 2016-12-29 DIAGNOSIS — E1151 Type 2 diabetes mellitus with diabetic peripheral angiopathy without gangrene: Secondary | ICD-10-CM | POA: Diagnosis not present

## 2016-12-29 DIAGNOSIS — M797 Fibromyalgia: Secondary | ICD-10-CM | POA: Diagnosis not present

## 2016-12-29 DIAGNOSIS — J441 Chronic obstructive pulmonary disease with (acute) exacerbation: Secondary | ICD-10-CM | POA: Diagnosis not present

## 2016-12-29 DIAGNOSIS — E785 Hyperlipidemia, unspecified: Secondary | ICD-10-CM | POA: Diagnosis not present

## 2016-12-29 NOTE — Patient Outreach (Signed)
[facility]) Care Management  12/29/2016  Jennifer [name] 16-Jul-1961 [id number]  CSW received an InBasket message from patient's RNCM with [facility], Tomasa Rand requesting that CSW make an outreach attempt to patient today, as patient reported that she would be home all day today.  Shortly after receiving this request, CSW made an outreach attempt to patient, without success.  A HIPAA compliant message was left for patient on voicemail, encouraging her to contact CSW at her earliest convenience.  [name] indicated that patient reported that she had tried to contact CSW; however, CSW has received no return calls from patient.  CSW will make a third and final outreach attempt to patient on Tuesday, January 9th.  If patient is not reached or a return call is not received from patient in the meantime, an outreach letter will be mailed to patient's home. Patient has a significant history of non-compliance with this CSW, as well as with CSW colleague, Eula Fried.  Both CSW and Ms. Blanch Media have worked extensively with patient in the past with regards to transportation resources.  CSW was able to ensure that patient was approved for transportation services through [employer]) with the Technical sales engineer, as well as Armed forces technical officer through [employer] [place].  At one point, CSW was also able to get patient approved for Medicaid Transportation through the [place], but patient allowed her application to lapse.  CSW and Ms. Brooke had also approved patient for transportation services through [employer] and [employer], at the expense of [employer], but these services have since been terminated due to overuse. Nat Christen, BSW, MSW, LCSW  Licensed Education officer, environmental [facility] System  Mailing  [place] [address] [place], [place] [address] Physical Address-[address], [place], [place] [address] Toll Free Main # [phone] Fax # [phone] Cell # [phone]  Fax # [phone]  [email or web address]

## 2016-12-29 NOTE — Patient Outreach (Signed)
Transition of care: Placed call to patient who states that she does not feel good today.  Reports no fever.  Denies cold symptoms. Denies NVD. States that she " just does not feel well"  States that she cancelled her wound center appointments.   Patient unable to verbalize her last CBG reading. States that it was greater than 200. Reports that her breathing is FINE.    Request I call [facility] and ask them to come do dressing change since she cancelled her apppointment.    Placed call to [place] and spoke with Jenny Reichmann who states she will look at schedule and call the patient.   Patient reports that she has attempted to reach [facility] social worker and needs assistance with transportation. Will send in basket message to [facility] social worker.  PLAN: Encouraged patient to attend pending appointments. Reviewed importance of taking all medications as prescribed. Will continue weekly transition of care calls.  Tomasa Rand, RN, BSN, CEN [facility] [employer] [phone]

## 2016-12-30 ENCOUNTER — Other Ambulatory Visit: Payer: Self-pay | Admitting: *Deleted

## 2016-12-30 ENCOUNTER — Other Ambulatory Visit: Payer: Self-pay | Admitting: Internal Medicine

## 2016-12-30 DIAGNOSIS — E11621 Type 2 diabetes mellitus with foot ulcer: Secondary | ICD-10-CM | POA: Diagnosis not present

## 2016-12-30 DIAGNOSIS — E1159 Type 2 diabetes mellitus with other circulatory complications: Secondary | ICD-10-CM | POA: Diagnosis not present

## 2016-12-30 DIAGNOSIS — E785 Hyperlipidemia, unspecified: Secondary | ICD-10-CM | POA: Diagnosis not present

## 2016-12-30 DIAGNOSIS — I110 Hypertensive heart disease with heart failure: Secondary | ICD-10-CM | POA: Diagnosis not present

## 2016-12-30 DIAGNOSIS — I5032 Chronic diastolic (congestive) heart failure: Secondary | ICD-10-CM | POA: Diagnosis not present

## 2016-12-30 DIAGNOSIS — I38 Endocarditis, valve unspecified: Secondary | ICD-10-CM | POA: Diagnosis not present

## 2016-12-30 DIAGNOSIS — M797 Fibromyalgia: Secondary | ICD-10-CM | POA: Diagnosis not present

## 2016-12-30 DIAGNOSIS — J449 Chronic obstructive pulmonary disease, unspecified: Secondary | ICD-10-CM | POA: Diagnosis not present

## 2016-12-30 DIAGNOSIS — E11319 Type 2 diabetes mellitus with unspecified diabetic retinopathy without macular edema: Secondary | ICD-10-CM | POA: Diagnosis not present

## 2016-12-30 DIAGNOSIS — J441 Chronic obstructive pulmonary disease with (acute) exacerbation: Secondary | ICD-10-CM | POA: Diagnosis not present

## 2016-12-30 DIAGNOSIS — E1151 Type 2 diabetes mellitus with diabetic peripheral angiopathy without gangrene: Secondary | ICD-10-CM | POA: Diagnosis not present

## 2016-12-30 DIAGNOSIS — E1121 Type 2 diabetes mellitus with diabetic nephropathy: Secondary | ICD-10-CM | POA: Diagnosis not present

## 2016-12-30 DIAGNOSIS — L97511 Non-pressure chronic ulcer of other part of right foot limited to breakdown of skin: Secondary | ICD-10-CM | POA: Diagnosis not present

## 2016-12-30 DIAGNOSIS — E1142 Type 2 diabetes mellitus with diabetic polyneuropathy: Secondary | ICD-10-CM | POA: Diagnosis not present

## 2016-12-30 DIAGNOSIS — G894 Chronic pain syndrome: Secondary | ICD-10-CM | POA: Diagnosis not present

## 2016-12-30 NOTE — Patient Outreach (Signed)
[facility]) Care Management  12/30/2016  Jennifer [name] 09-13-1961 [id number]   CSW was able to make initial contact with patient today to discuss need for social work services and resources.  Patient was able to satisfy two HIPAA compliant identifiers, which included her full name and date of birth.  Patient remembered having worked with [place] in the past and was most appreciative of CSW's repeated phone attempts and persistence.  Patient denied need for extermination of bed bugs, indicating that she has "gotten that situation under control".  Patient also denied need for counseling and/or supportive services for Depression.  Patient stated, "I've still got all the information that you sent me from before".  Patient was not interested in having CSW refer her for psychiatric services, whether it be medication management and/or psychotherapy). Patient expressed an interest in receiving financial assistance.  Patient went on to say that she needs help paying for her insurance premiums, as she did not realize that she was denied partial coverage through Adult Medicaid.  CSW explained to patient that she needs to contact her medicaid case worker at the [employer] to check the status, as patient may only have a temporary lapse in coverage due to failure to submit the appropriate paperwork before the deadline.  Patient inquired as to whether or not CSW could go ahead and pay the balance off through [facility] [employer].  CSW denied, indicating that this would be a conflict of interest.  However, CSW agreed to mail patient a list of Psychologist, sport and exercise. Patient indicated that she would have no use for that information, as she already has several copies that were sent to her in the past. Patient admits that she is still utilizing [employer]) to get to and from her  physician appointments.  No additional social work needs have been identified at this time; therefore, CSW will proceed with a case closure.  CSW will notify patient's RNCM with [facility], Tomasa Rand of CSW's plans to close patient's case.  CSW will fax an update to patient's Primary Care Physician, Dr. Larey Dresser to ensure that they are aware of CSW's involvement with patient's plan of care.  CSW will submit a case closure request to Josepha Pigg, Care Management Assistant with [facility], in the form of an [employer].  CSW will ensure that Mrs. Quentin Cornwall is aware of Mrs. Jonathon Jordan, RNCM with [facility], continued involvement with patient's care. Nat Christen, BSW, MSW, LCSW  Licensed Education officer, environmental Health System  Mailing [place] [address], [place], [place] [address] Physical Address-[address], [place],  [address] Toll Free Main # [phone] Fax # [phone] Cell # [phone]  Fax # [phone]  [email or web address]

## 2016-12-31 ENCOUNTER — Other Ambulatory Visit: Payer: Self-pay | Admitting: *Deleted

## 2016-12-31 ENCOUNTER — Telehealth: Payer: Self-pay | Admitting: Internal Medicine

## 2016-12-31 DIAGNOSIS — L97511 Non-pressure chronic ulcer of other part of right foot limited to breakdown of skin: Secondary | ICD-10-CM | POA: Diagnosis not present

## 2016-12-31 DIAGNOSIS — M797 Fibromyalgia: Secondary | ICD-10-CM | POA: Diagnosis not present

## 2016-12-31 DIAGNOSIS — E1159 Type 2 diabetes mellitus with other circulatory complications: Secondary | ICD-10-CM | POA: Diagnosis not present

## 2016-12-31 DIAGNOSIS — I5032 Chronic diastolic (congestive) heart failure: Secondary | ICD-10-CM | POA: Diagnosis not present

## 2016-12-31 DIAGNOSIS — E785 Hyperlipidemia, unspecified: Secondary | ICD-10-CM | POA: Diagnosis not present

## 2016-12-31 DIAGNOSIS — E1151 Type 2 diabetes mellitus with diabetic peripheral angiopathy without gangrene: Secondary | ICD-10-CM | POA: Diagnosis not present

## 2016-12-31 DIAGNOSIS — E1121 Type 2 diabetes mellitus with diabetic nephropathy: Secondary | ICD-10-CM | POA: Diagnosis not present

## 2016-12-31 DIAGNOSIS — G894 Chronic pain syndrome: Secondary | ICD-10-CM | POA: Diagnosis not present

## 2016-12-31 DIAGNOSIS — E11621 Type 2 diabetes mellitus with foot ulcer: Secondary | ICD-10-CM | POA: Diagnosis not present

## 2016-12-31 DIAGNOSIS — E1142 Type 2 diabetes mellitus with diabetic polyneuropathy: Secondary | ICD-10-CM | POA: Diagnosis not present

## 2016-12-31 DIAGNOSIS — I110 Hypertensive heart disease with heart failure: Secondary | ICD-10-CM | POA: Diagnosis not present

## 2016-12-31 DIAGNOSIS — E11319 Type 2 diabetes mellitus with unspecified diabetic retinopathy without macular edema: Secondary | ICD-10-CM | POA: Diagnosis not present

## 2016-12-31 DIAGNOSIS — J441 Chronic obstructive pulmonary disease with (acute) exacerbation: Secondary | ICD-10-CM | POA: Diagnosis not present

## 2016-12-31 NOTE — Telephone Encounter (Signed)
Left msg to call back, I was calling to clarify w/c pharmacy to call in Lyrica.

## 2016-12-31 NOTE — Telephone Encounter (Signed)
Called in rx for lyrica.

## 2016-12-31 NOTE — Telephone Encounter (Signed)
All other meds were filled and has yr supply

## 2016-12-31 NOTE — Telephone Encounter (Signed)
APT. REMINDER CALL, LMTCB °

## 2017-01-01 ENCOUNTER — Telehealth: Payer: Self-pay

## 2017-01-01 ENCOUNTER — Encounter: Payer: Self-pay | Admitting: Internal Medicine

## 2017-01-01 ENCOUNTER — Ambulatory Visit: Payer: Medicare Other | Admitting: Internal Medicine

## 2017-01-01 ENCOUNTER — Ambulatory Visit (INDEPENDENT_AMBULATORY_CARE_PROVIDER_SITE_OTHER): Payer: Medicare Other | Admitting: Internal Medicine

## 2017-01-01 DIAGNOSIS — E1121 Type 2 diabetes mellitus with diabetic nephropathy: Secondary | ICD-10-CM | POA: Diagnosis not present

## 2017-01-01 DIAGNOSIS — E11621 Type 2 diabetes mellitus with foot ulcer: Secondary | ICD-10-CM | POA: Diagnosis not present

## 2017-01-01 DIAGNOSIS — E1169 Type 2 diabetes mellitus with other specified complication: Secondary | ICD-10-CM | POA: Diagnosis not present

## 2017-01-01 DIAGNOSIS — Z794 Long term (current) use of insulin: Secondary | ICD-10-CM

## 2017-01-01 DIAGNOSIS — L97511 Non-pressure chronic ulcer of other part of right foot limited to breakdown of skin: Secondary | ICD-10-CM | POA: Diagnosis not present

## 2017-01-01 DIAGNOSIS — E1165 Type 2 diabetes mellitus with hyperglycemia: Secondary | ICD-10-CM

## 2017-01-01 DIAGNOSIS — Z598 Other problems related to housing and economic circumstances: Secondary | ICD-10-CM

## 2017-01-01 DIAGNOSIS — E1151 Type 2 diabetes mellitus with diabetic peripheral angiopathy without gangrene: Secondary | ICD-10-CM

## 2017-01-01 DIAGNOSIS — I5032 Chronic diastolic (congestive) heart failure: Secondary | ICD-10-CM | POA: Diagnosis not present

## 2017-01-01 DIAGNOSIS — IMO0002 Reserved for concepts with insufficient information to code with codable children: Secondary | ICD-10-CM

## 2017-01-01 DIAGNOSIS — M797 Fibromyalgia: Secondary | ICD-10-CM | POA: Diagnosis not present

## 2017-01-01 DIAGNOSIS — E11319 Type 2 diabetes mellitus with unspecified diabetic retinopathy without macular edema: Secondary | ICD-10-CM | POA: Diagnosis not present

## 2017-01-01 DIAGNOSIS — E1159 Type 2 diabetes mellitus with other circulatory complications: Secondary | ICD-10-CM | POA: Diagnosis not present

## 2017-01-01 DIAGNOSIS — G894 Chronic pain syndrome: Secondary | ICD-10-CM | POA: Diagnosis not present

## 2017-01-01 DIAGNOSIS — I700 Atherosclerosis of aorta: Secondary | ICD-10-CM

## 2017-01-01 DIAGNOSIS — Z993 Dependence on wheelchair: Secondary | ICD-10-CM

## 2017-01-01 DIAGNOSIS — I872 Venous insufficiency (chronic) (peripheral): Secondary | ICD-10-CM

## 2017-01-01 DIAGNOSIS — I110 Hypertensive heart disease with heart failure: Secondary | ICD-10-CM | POA: Diagnosis not present

## 2017-01-01 DIAGNOSIS — I152 Hypertension secondary to endocrine disorders: Secondary | ICD-10-CM

## 2017-01-01 DIAGNOSIS — M86171 Other acute osteomyelitis, right ankle and foot: Secondary | ICD-10-CM | POA: Diagnosis not present

## 2017-01-01 DIAGNOSIS — E1142 Type 2 diabetes mellitus with diabetic polyneuropathy: Secondary | ICD-10-CM | POA: Diagnosis not present

## 2017-01-01 DIAGNOSIS — B9689 Other specified bacterial agents as the cause of diseases classified elsewhere: Secondary | ICD-10-CM | POA: Diagnosis not present

## 2017-01-01 DIAGNOSIS — E785 Hyperlipidemia, unspecified: Secondary | ICD-10-CM | POA: Diagnosis not present

## 2017-01-01 DIAGNOSIS — F17210 Nicotine dependence, cigarettes, uncomplicated: Secondary | ICD-10-CM

## 2017-01-01 DIAGNOSIS — I10 Essential (primary) hypertension: Secondary | ICD-10-CM

## 2017-01-01 DIAGNOSIS — F1120 Opioid dependence, uncomplicated: Secondary | ICD-10-CM

## 2017-01-01 DIAGNOSIS — M869 Osteomyelitis, unspecified: Secondary | ICD-10-CM

## 2017-01-01 DIAGNOSIS — Z79899 Other long term (current) drug therapy: Secondary | ICD-10-CM

## 2017-01-01 DIAGNOSIS — J441 Chronic obstructive pulmonary disease with (acute) exacerbation: Secondary | ICD-10-CM | POA: Diagnosis not present

## 2017-01-01 MED ORDER — OXYCODONE-ACETAMINOPHEN 5-325 MG PO TABS: 1.0000 | ORAL_TABLET | ORAL | 0 refills | Status: DC | PRN

## 2017-01-01 NOTE — Telephone Encounter (Signed)
error 

## 2017-01-01 NOTE — Telephone Encounter (Signed)
Jennifer [name] from [facility] requesting VO for physical therapy. Please call back.

## 2017-01-01 NOTE — Telephone Encounter (Signed)
VO for [facility] PT for 2x week for 2 weeks, do you agree?

## 2017-01-01 NOTE — Telephone Encounter (Signed)
Lm for rtc 

## 2017-01-01 NOTE — Patient Instructions (Signed)
1. Schedule appt in 2 weeks. I can see you on the 23rd for your wheelchair evaluation with Debbie from [facility]  2. Schedule an appt with [name] on the 23rd also to review your sugars (check at least 4 times a day) and your food log.  3. I will be happy to do anything I can to help with your foot  4. I gave you 3 copies of your pain medicine

## 2017-01-02 ENCOUNTER — Other Ambulatory Visit: Payer: Self-pay | Admitting: Internal Medicine

## 2017-01-02 DIAGNOSIS — E11621 Type 2 diabetes mellitus with foot ulcer: Secondary | ICD-10-CM | POA: Diagnosis present

## 2017-01-02 DIAGNOSIS — M868X7 Other osteomyelitis, ankle and foot: Secondary | ICD-10-CM | POA: Diagnosis not present

## 2017-01-02 DIAGNOSIS — R600 Localized edema: Secondary | ICD-10-CM | POA: Diagnosis not present

## 2017-01-02 DIAGNOSIS — M009 Pyogenic arthritis, unspecified: Secondary | ICD-10-CM | POA: Diagnosis not present

## 2017-01-02 DIAGNOSIS — F17200 Nicotine dependence, unspecified, uncomplicated: Secondary | ICD-10-CM | POA: Diagnosis not present

## 2017-01-02 DIAGNOSIS — M869 Osteomyelitis, unspecified: Secondary | ICD-10-CM | POA: Diagnosis not present

## 2017-01-02 DIAGNOSIS — I87331 Chronic venous hypertension (idiopathic) with ulcer and inflammation of right lower extremity: Secondary | ICD-10-CM | POA: Diagnosis not present

## 2017-01-02 DIAGNOSIS — E1165 Type 2 diabetes mellitus with hyperglycemia: Secondary | ICD-10-CM | POA: Diagnosis not present

## 2017-01-02 DIAGNOSIS — E1169 Type 2 diabetes mellitus with other specified complication: Secondary | ICD-10-CM | POA: Diagnosis not present

## 2017-01-02 DIAGNOSIS — L97516 Non-pressure chronic ulcer of other part of right foot with bone involvement without evidence of necrosis: Secondary | ICD-10-CM | POA: Diagnosis not present

## 2017-01-02 NOTE — Progress Notes (Signed)
   Subjective:    [name], female    DOB: [birth year], 56 y.o.   MRN: [id number]  HPI  Jennifer [name] is here for Pain follow-up. Please see the A&P for the status of the pt's chronic medical problems.  ROS : per ROS section and in problem oriented charting. All other systems are negative.  PMHx, Soc hx, and / or Fam hx : Her son's girlfriend and her children are no longer living with her. Her 2 sons are still living with her. She continues to have a roach and bedbug problem in her apartment. She states that the landlord will not pay for an exterminator and she had to pay $900 for the first treatment and 454 the second treatment which was uneffective. She states that there is new management and she is going to ask them whether they will treat the apartment. Other apartments are also affected.  She is now watching her 2 great nieces who are age 8 and 41 years old. She enjoys this because they are a distraction from her current life.  She is participating in home physical therapy. They come 2 times per week to help her walk.  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 She states she will be able to use her current chair and her home and now will be able to exit her home independently out the back door. She cannot exit her back door in her manual chair because she lands on grass and get bogged down in mud and holes.  Review of Systems  Constitutional: Negative for activity change, fatigue and unexpected weight change.  Gastrointestinal: Positive for abdominal pain.  Musculoskeletal: Positive for arthralgias, back pain, gait problem and myalgias.  Skin: Positive for wound.  Psychiatric/Behavioral: Positive for dysphoric mood and sleep disturbance.       Objective:   Physical Exam  Constitutional: She is oriented to person, place, and time. She appears well-developed and well-nourished. No  distress.  HENT:  Head: Normocephalic and atraumatic.  Right Ear: External ear normal.  Left Ear: External ear normal.  Eyes: Conjunctivae and EOM are normal.  Neurological: She is alert and oriented to person, place, and time.  Skin: She is not diaphoretic.  Psychiatric: She has a normal mood and affect. Her behavior is normal. Judgment and thought content normal.          Assessment & Plan:

## 2017-01-02 NOTE — Assessment & Plan Note (Signed)
This problem is chronic and stable. It was noted incidentally on a CT scan in 2007. Risk factor management is limited by the patient's inability to control her health issues. She continues to smoke, blood pressure is uncontrolled, her diabetes is uncontrolled, and she is unable to lose weight.  Plan: Risk factor management to the best of my ability

## 2017-01-02 NOTE — Assessment & Plan Note (Signed)
This problem is chronic and worse. She is on oxycodone APAP 5 mg and is on a very slow opioid taper. Her most recent prescriptions were for 246 pills. I am pausing her taper temporarily due to her increased pain with her right foot osteomyelitis. Her urine drug screens have been appropriate along with her [place] narcotic database.  Plan: Stay continue oxycodone APAP 5 mg 246 pills per 30 days. 3 papers prescriptions were handed to her today.

## 2017-01-02 NOTE — Telephone Encounter (Signed)
Agree. thanks

## 2017-01-02 NOTE — Assessment & Plan Note (Signed)
This is a new diagnosis. She had self referred herself back to wound care in December due to an ulcer on her right fifth toe. Her wound care doctor obtained an MRI of that foot on January 5. It showed osteomyelitis of the proximal and middle phalanges of the fifth digit with septic PIP joint and bony destruction of the head of the proximal phalanx. She is returning to wound care tomorrow to get these results and further recommendations. She states that she was told if it showed osteomyelitis they would insert a PICC and give IV antibiotics. Currently she is given routine wound care in her home 2 times per week for bandage changes and wrapping her leg secondary to the venous insufficiency.  We discussed that her edema and her poor diabetes control are contributing to her infection. Her ability to control these risk factors is low.  Plan:Obtain wound care documentation

## 2017-01-02 NOTE — Assessment & Plan Note (Signed)
This problem is chronic and plan controlled. She is quite knowledgeable in regards to what she should and should not eat. She knows that corn is a starch and that she needs to limit her starch. She states her knowledge is great that her action is poor. This is true and her depression and poor social situation placed significantly into her inability to control her medical problems. I discussed that her poor diabetes control was contributing to her new diagnosis of right osteomyelitis. She is on insulin. Every other time that I had inquired about her insulin dosing she would tell me the dose that was not what had been recently prescribed. She is on 70/30 and to take it twice a day. She does state that she takes it twice a day. Her A1c has always been poorly controlled and most recently was 8.5. She brought her CBG log. She is testing about once a day. Her first glucose of the day ranges from 100 300 and remains about 300 the rest of the day. However there are some levels in the 70s between 8 and 10 PM.  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 She now has osteomyelitis and has been told that her poor sugar control is contributing to her inability to remain infection free and to treat the current infection. I put the illness on her today to tell me what she wanted to do in regards to her diabetes. She states she wants and needs to check her sugar 3-4 times daily. I asked her to do so and to keep a food log. [name] gave her paperwork to write down her glucose log and a food log. She will return in 2 weeks.  Plan :Continue current medications, glucose and food log, CBGs 3-4 times a day, return in 2 weeks.

## 2017-01-02 NOTE — Assessment & Plan Note (Signed)
BP Readings from Last 3 Encounters:  01/01/17 (!) 138/58  12/12/16 (!) 149/63  12/02/16 138/70   This problem is chronic and on change. Her blood pressure is not adequately controlled. She is on Norvasc 10, and Avapro 40 mg, and atenolol 100 mg daily. I had gotten paperwork recently from her pharmacy that indicated she was getting both an Ace and an ARB. Her ACE had been stopped this fall when it was thought to be contributing to her cough. At her last hospital discharge the ARB was stopped and her ACE was resumed. I have returned paperwork to the pharmacy to stop the ARB as dual therapy has significant side effects and is not clinically indicated. She has been told to not take the ARB. It is hard to know exactly how compliant she is with her medications although she states she is compliant.  Plan: Continue current medications. Further advice, medication manipulation, lifestyle modifications are all limited by her inability to participate in her care.

## 2017-01-05 ENCOUNTER — Other Ambulatory Visit: Payer: Self-pay

## 2017-01-05 NOTE — Patient Outreach (Signed)
Transition of care: Placed call to patient for transition of care. No answer. Left a message requesting a call back.  PLAN: will continue weekly transition of care call.  Tomasa Rand, RN, BSN, CEN [facility] [employer] [phone]

## 2017-01-06 ENCOUNTER — Other Ambulatory Visit: Payer: Self-pay

## 2017-01-06 ENCOUNTER — Emergency Department (HOSPITAL_COMMUNITY): Payer: Medicare Other

## 2017-01-06 ENCOUNTER — Telehealth: Payer: Self-pay | Admitting: Internal Medicine

## 2017-01-06 ENCOUNTER — Inpatient Hospital Stay (HOSPITAL_COMMUNITY)
Admission: EM | Admit: 2017-01-06 | Discharge: 2017-01-12 | DRG: 071 | Disposition: A | Discharge status: Home Health | Payer: Medicare Other | Attending: Internal Medicine | Admitting: Internal Medicine

## 2017-01-06 DIAGNOSIS — Z8249 Family history of ischemic heart disease and other diseases of the circulatory system: Secondary | ICD-10-CM

## 2017-01-06 DIAGNOSIS — Z794 Long term (current) use of insulin: Secondary | ICD-10-CM | POA: Diagnosis not present

## 2017-01-06 DIAGNOSIS — E11621 Type 2 diabetes mellitus with foot ulcer: Secondary | ICD-10-CM | POA: Diagnosis not present

## 2017-01-06 DIAGNOSIS — Y846 Urinary catheterization as the cause of abnormal reaction of the patient, or of later complication, without mention of misadventure at the time of the procedure: Secondary | ICD-10-CM | POA: Diagnosis present

## 2017-01-06 DIAGNOSIS — Z888 Allergy status to other drugs, medicaments and biological substances status: Secondary | ICD-10-CM | POA: Diagnosis not present

## 2017-01-06 DIAGNOSIS — E1151 Type 2 diabetes mellitus with diabetic peripheral angiopathy without gangrene: Secondary | ICD-10-CM | POA: Diagnosis present

## 2017-01-06 DIAGNOSIS — M86671 Other chronic osteomyelitis, right ankle and foot: Secondary | ICD-10-CM | POA: Diagnosis present

## 2017-01-06 DIAGNOSIS — E876 Hypokalemia: Secondary | ICD-10-CM | POA: Diagnosis present

## 2017-01-06 DIAGNOSIS — R8299 Other abnormal findings in urine: Secondary | ICD-10-CM | POA: Diagnosis not present

## 2017-01-06 DIAGNOSIS — E1159 Type 2 diabetes mellitus with other circulatory complications: Secondary | ICD-10-CM | POA: Diagnosis present

## 2017-01-06 DIAGNOSIS — E1139 Type 2 diabetes mellitus with other diabetic ophthalmic complication: Secondary | ICD-10-CM | POA: Diagnosis present

## 2017-01-06 DIAGNOSIS — E1142 Type 2 diabetes mellitus with diabetic polyneuropathy: Secondary | ICD-10-CM | POA: Diagnosis present

## 2017-01-06 DIAGNOSIS — K219 Gastro-esophageal reflux disease without esophagitis: Secondary | ICD-10-CM | POA: Diagnosis present

## 2017-01-06 DIAGNOSIS — F17210 Nicotine dependence, cigarettes, uncomplicated: Secondary | ICD-10-CM | POA: Diagnosis present

## 2017-01-06 DIAGNOSIS — L97511 Non-pressure chronic ulcer of other part of right foot limited to breakdown of skin: Secondary | ICD-10-CM | POA: Diagnosis not present

## 2017-01-06 DIAGNOSIS — E118 Type 2 diabetes mellitus with unspecified complications: Secondary | ICD-10-CM | POA: Diagnosis present

## 2017-01-06 DIAGNOSIS — I5032 Chronic diastolic (congestive) heart failure: Secondary | ICD-10-CM | POA: Diagnosis present

## 2017-01-06 DIAGNOSIS — M869 Osteomyelitis, unspecified: Secondary | ICD-10-CM | POA: Diagnosis not present

## 2017-01-06 DIAGNOSIS — R809 Proteinuria, unspecified: Secondary | ICD-10-CM | POA: Diagnosis present

## 2017-01-06 DIAGNOSIS — J441 Chronic obstructive pulmonary disease with (acute) exacerbation: Secondary | ICD-10-CM | POA: Diagnosis not present

## 2017-01-06 DIAGNOSIS — E1165 Type 2 diabetes mellitus with hyperglycemia: Secondary | ICD-10-CM | POA: Diagnosis present

## 2017-01-06 DIAGNOSIS — E1121 Type 2 diabetes mellitus with diabetic nephropathy: Secondary | ICD-10-CM | POA: Diagnosis not present

## 2017-01-06 DIAGNOSIS — Z833 Family history of diabetes mellitus: Secondary | ICD-10-CM

## 2017-01-06 DIAGNOSIS — G9340 Encephalopathy, unspecified: Secondary | ICD-10-CM | POA: Diagnosis present

## 2017-01-06 DIAGNOSIS — S199XXA Unspecified injury of neck, initial encounter: Secondary | ICD-10-CM | POA: Diagnosis not present

## 2017-01-06 DIAGNOSIS — R4182 Altered mental status, unspecified: Secondary | ICD-10-CM

## 2017-01-06 DIAGNOSIS — Z9181 History of falling: Secondary | ICD-10-CM | POA: Diagnosis not present

## 2017-01-06 DIAGNOSIS — R197 Diarrhea, unspecified: Secondary | ICD-10-CM | POA: Diagnosis present

## 2017-01-06 DIAGNOSIS — H409 Unspecified glaucoma: Secondary | ICD-10-CM | POA: Diagnosis present

## 2017-01-06 DIAGNOSIS — Z9114 Patient's other noncompliance with medication regimen: Secondary | ICD-10-CM

## 2017-01-06 DIAGNOSIS — R778 Other specified abnormalities of plasma proteins: Secondary | ICD-10-CM | POA: Diagnosis present

## 2017-01-06 DIAGNOSIS — E785 Hyperlipidemia, unspecified: Secondary | ICD-10-CM | POA: Diagnosis present

## 2017-01-06 DIAGNOSIS — E11319 Type 2 diabetes mellitus with unspecified diabetic retinopathy without macular edema: Secondary | ICD-10-CM | POA: Diagnosis not present

## 2017-01-06 DIAGNOSIS — R0602 Shortness of breath: Secondary | ICD-10-CM | POA: Diagnosis present

## 2017-01-06 DIAGNOSIS — F329 Major depressive disorder, single episode, unspecified: Secondary | ICD-10-CM | POA: Diagnosis present

## 2017-01-06 DIAGNOSIS — M6282 Rhabdomyolysis: Secondary | ICD-10-CM | POA: Diagnosis present

## 2017-01-06 DIAGNOSIS — I10 Essential (primary) hypertension: Secondary | ICD-10-CM | POA: Diagnosis not present

## 2017-01-06 DIAGNOSIS — I110 Hypertensive heart disease with heart failure: Secondary | ICD-10-CM | POA: Diagnosis present

## 2017-01-06 DIAGNOSIS — R300 Dysuria: Secondary | ICD-10-CM | POA: Diagnosis present

## 2017-01-06 DIAGNOSIS — Z91041 Radiographic dye allergy status: Secondary | ICD-10-CM | POA: Diagnosis not present

## 2017-01-06 DIAGNOSIS — Z79899 Other long term (current) drug therapy: Secondary | ICD-10-CM

## 2017-01-06 DIAGNOSIS — Z885 Allergy status to narcotic agent status: Secondary | ICD-10-CM | POA: Diagnosis not present

## 2017-01-06 DIAGNOSIS — R319 Hematuria, unspecified: Secondary | ICD-10-CM | POA: Diagnosis present

## 2017-01-06 DIAGNOSIS — M069 Rheumatoid arthritis, unspecified: Secondary | ICD-10-CM | POA: Diagnosis present

## 2017-01-06 DIAGNOSIS — Z89512 Acquired absence of left leg below knee: Secondary | ICD-10-CM | POA: Diagnosis not present

## 2017-01-06 DIAGNOSIS — J449 Chronic obstructive pulmonary disease, unspecified: Secondary | ICD-10-CM | POA: Diagnosis present

## 2017-01-06 DIAGNOSIS — I152 Hypertension secondary to endocrine disorders: Secondary | ICD-10-CM | POA: Diagnosis not present

## 2017-01-06 DIAGNOSIS — Z89519 Acquired absence of unspecified leg below knee: Secondary | ICD-10-CM

## 2017-01-06 DIAGNOSIS — R823 Hemoglobinuria: Secondary | ICD-10-CM | POA: Diagnosis present

## 2017-01-06 DIAGNOSIS — B9689 Other specified bacterial agents as the cause of diseases classified elsewhere: Secondary | ICD-10-CM | POA: Diagnosis not present

## 2017-01-06 DIAGNOSIS — Z89511 Acquired absence of right leg below knee: Secondary | ICD-10-CM

## 2017-01-06 DIAGNOSIS — Z825 Family history of asthma and other chronic lower respiratory diseases: Secondary | ICD-10-CM

## 2017-01-06 DIAGNOSIS — E119 Type 2 diabetes mellitus without complications: Secondary | ICD-10-CM | POA: Diagnosis present

## 2017-01-06 DIAGNOSIS — M797 Fibromyalgia: Secondary | ICD-10-CM | POA: Diagnosis not present

## 2017-01-06 DIAGNOSIS — Z86718 Personal history of other venous thrombosis and embolism: Secondary | ICD-10-CM

## 2017-01-06 DIAGNOSIS — R1111 Vomiting without nausea: Secondary | ICD-10-CM | POA: Diagnosis not present

## 2017-01-06 DIAGNOSIS — R81 Glycosuria: Secondary | ICD-10-CM | POA: Diagnosis present

## 2017-01-06 DIAGNOSIS — R3129 Other microscopic hematuria: Secondary | ICD-10-CM | POA: Diagnosis not present

## 2017-01-06 DIAGNOSIS — E1169 Type 2 diabetes mellitus with other specified complication: Secondary | ICD-10-CM | POA: Diagnosis present

## 2017-01-06 DIAGNOSIS — I517 Cardiomegaly: Secondary | ICD-10-CM | POA: Diagnosis not present

## 2017-01-06 DIAGNOSIS — G894 Chronic pain syndrome: Secondary | ICD-10-CM | POA: Diagnosis not present

## 2017-01-06 LAB — COMPREHENSIVE METABOLIC PANEL
ALT: 39 U/L (ref 14–54)
AST: 139 U/L — ABNORMAL HIGH (ref 15–41)
Albumin: 3.3 g/dL — ABNORMAL LOW (ref 3.5–5.0)
Alkaline Phosphatase: 81 U/L (ref 38–126)
Anion gap: 13 (ref 5–15)
BUN: 12 mg/dL (ref 6–20)
CO2: 26 mmol/L (ref 22–32)
Calcium: 9.1 mg/dL (ref 8.9–10.3)
Chloride: 102 mmol/L (ref 101–111)
Creatinine, Ser: 0.84 mg/dL (ref 0.44–1.00)
GFR calc Af Amer: >60 mL/min (ref >60)
GFR calc non Af Amer: >60 mL/min (ref >60)
Glucose, Bld: 225 mg/dL — ABNORMAL HIGH (ref 65–99)
Potassium: 3.3 mmol/L — ABNORMAL LOW (ref 3.5–5.1)
Sodium: 141 mmol/L (ref 135–145)
Total Bilirubin: 1.3 mg/dL — ABNORMAL HIGH (ref 0.3–1.2)
Total Protein: 6.8 g/dL (ref 6.5–8.1)

## 2017-01-06 LAB — ETHANOL: Alcohol, Ethyl (B): <5 mg/dL (ref <5)

## 2017-01-06 LAB — URINALYSIS, ROUTINE W REFLEX MICROSCOPIC
Bacteria, UA: NONE SEEN
Bilirubin Urine: NEGATIVE
Glucose, UA: >=500 mg/dL — AB
Ketones, ur: 80 mg/dL — AB
Nitrite: NEGATIVE
Protein, ur: 100 mg/dL — AB
Specific Gravity, Urine: 1.016 (ref 1.005–1.030)
pH: 6 (ref 5.0–8.0)

## 2017-01-06 LAB — CBC WITH DIFFERENTIAL/PLATELET
Basophils Absolute: 0 10*3/uL (ref 0.0–0.1)
Basophils Relative: 0 %
Eosinophils Absolute: 0 10*3/uL (ref 0.0–0.7)
Eosinophils Relative: 0 %
HCT: 43.8 % (ref 36.0–46.0)
Hemoglobin: 14.3 g/dL (ref 12.0–15.0)
Lymphocytes Relative: 22 %
Lymphs Abs: 2.4 10*3/uL (ref 0.7–4.0)
MCH: 30.9 pg (ref 26.0–34.0)
MCHC: 32.6 g/dL (ref 30.0–36.0)
MCV: 94.6 fL (ref 78.0–100.0)
Monocytes Absolute: 0.7 10*3/uL (ref 0.1–1.0)
Monocytes Relative: 7 %
Neutro Abs: 7.7 10*3/uL (ref 1.7–7.7)
Neutrophils Relative %: 71 %
Platelets: 153 10*3/uL (ref 150–400)
RBC: 4.63 MIL/uL (ref 3.87–5.11)
RDW: 15.3 % (ref 11.5–15.5)
WBC: 10.9 10*3/uL — ABNORMAL HIGH (ref 4.0–10.5)

## 2017-01-06 LAB — RAPID URINE DRUG SCREEN, HOSP PERFORMED
Amphetamines: NOT DETECTED
Barbiturates: NOT DETECTED
Benzodiazepines: NOT DETECTED
Cocaine: NOT DETECTED
Opiates: NOT DETECTED
Tetrahydrocannabinol: NOT DETECTED

## 2017-01-06 LAB — TROPONIN I: Troponin I: 0.08 ng/mL (ref <0.03)

## 2017-01-06 LAB — I-STAT CG4 LACTIC ACID, ED: Lactic Acid, Venous: 1.75 mmol/L (ref 0.5–1.9)

## 2017-01-06 MED ORDER — SODIUM CHLORIDE 0.9 % IV SOLN: INTRAVENOUS | Status: DC

## 2017-01-06 NOTE — ED Notes (Signed)
Pt agitated following return from CT. Stated she needed to use restroom and attempted to get up from bed. This RN and one other attempted to position pt on bedpan however pt urinated on bed before pan was in place. No urine specimen was able to be collected.

## 2017-01-06 NOTE — Telephone Encounter (Signed)
Called pt to get info on wound care appt. Got voice mail. Left message for pt to call.

## 2017-01-06 NOTE — ED Triage Notes (Signed)
Pt arrived via ems after being called to the pts home for N/V/D x2 days. Per EMS, pts family stated she has not been acting herself in regard to mental status. Stated pt was talking to someone who was not present. No fever noted today. VSS upon arrival.

## 2017-01-06 NOTE — ED Provider Notes (Signed)
[facility] DEPT Provider Note   CSN: [id number] Arrival date & time: 01/06/17  [place]     History   Chief Complaint Chief Complaint  Patient presents with  . Nausea    HPI Jennifer [name] is a 56 y.o. female.  57 year old female presents from home with altered mental status times several days. Symptoms started after she fell walking up the stairs. Follows unwitnessed and it was unsure if she lost consciousness. According to her son, she has had increasing lethargy has had repetitive questions. She's had nausea and vomiting as well as some diarrhea. No reported fever or cough noted. No recent alcohol or drug use. No recent medication changes. No treatment use prior to arrival and patient came in via EMS.      Past Medical History:  Diagnosis Date  . Anginal pain [facility])    '3' of 10 ischemia ruled out 9/9   . 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 Asthma   . CHF (congestive heart failure) ([place])   . 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 Chronic bronchitis ([place])    "I get it alot" (09/28/2013)  . Chronic diastolic heart failure (HCC)    grade 2 per 2D echocardiogram (01/2013)  . Chronic lower back pain   . Chronic osteomyelitis of foot (HCC)    chronic, right secondary to diabetic foot ulcers  . Chronic pain syndrome 12/03/2011   Likely secondary to depression, "fibromyalgia", neuropathy, and obesity. Lumbar MRI 2014 no sig change from prior (2008) : Stable hypertrophic facet disease most notable at L4-5. Stable shallow left foraminal/extraforaminal disc protrusion at L4-5. No direct neural compression.      Marland Kitchen COPD 01/08/2007   PFT's 05/2007 : FEV1/FVC 82, FEV1 64% pred, FEF 25-75% 40% predicted, 16% improvement in FEV1 with bronchodilators.     . Depression   . Diabetic peripheral neuropathy ([place])   . DVT of upper extremity (deep vein thrombosis) ([place]) 03/11/2013   Secondary to PICC line. Right brachial vein, diagnosed on 03/10/2013 Coumadin for 3 months. End  date 06/10/2013   . Environmental allergies    Hx: of  . Exertional shortness of breath   . Fatty liver 2003   observed on ultrasound abdomen  . Fibromyalgia   . GERD (gastroesophageal reflux disease)   . Glaucoma   . Hyperlipidemia   . Hyperplastic colon polyp 12/2010   Per colonoscopy (12/2010) - Dr. Deatra Ina  . Hypertension   . 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 Blood cultures 01/26/13 S. Aureus and GBS. Blood cultures Feb 6th, 8th, and 9th and March were negative.Repeat TEE 3/20 negative for vegitations  . 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 2/2 tobacco use, DM, and cont weight bearing on surgical wound and developed gangrene   . Polymicrobial bacterial infection 01/2013   GBS and S. aureus bacteremia // Source likely infected diabetic foot ulcer  . PVD (peripheral vascular disease) with claudication ([place])    Stents to bilateral common iliac arteries (left 2005, right 2008), on chronic plavix  . Rheumatoid arthritis ([place])   . Tobacco abuse   . Type II diabetes mellitus with peripheral circulatory disorders, uncontrolled DX: 1993   Insulin dep. Poor control. Complicated by diabetic foot ulcer and diabetic eye disease.    Marland Kitchen Ulcer of foot, chronic (HCC)    Left. No OM per MRI (  01/2013)    Patient Active Problem List   Diagnosis Date Noted  . Oral mucosal lesion 11/07/2016  . Chronic cough 09/27/2016  . Diabetic osteomyelitis ([place]) 06/30/2016  . Thrombocytopenia ([place]) 06/05/2016  . 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 Type 2 diabetes with nephropathy ([place]) 09/07/2015  . Uncontrolled diabetes mellitus with retinopathy, due to underlying condition, without macular edema ([place]) 09/05/2015  . Diabetic retinopathy ([place]) 09/05/2015  . Counseling regarding end of life decision making 06/14/2015  . Atherosclerosis of aorta ([place]) 04/04/2015  . Anemia 10/05/2014  .  Chronic diastolic heart failure ([place])   . S/P BKA (below knee amputation) unilateral ([place])   . Tobacco abuse   . Severe obesity (BMI >= 40) ([place]) 03/02/2013  . Abnormality of gait 03/01/2013  . Infective endocarditis 01/22/2013  . Healthcare maintenance 07/10/2012  . Opioid dependence ([place]) 12/03/2011  . Secondary diabetes mellitus with peripheral vascular disease ([place]) 08/27/2011  . 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 ([place]) 11/29/2009  . Hypertension associated with diabetes ([place]) 11/29/2009  . Chronic insomnia 10/25/2009  . GASTROESOPHAGEAL REFLUX DISEASE 11/24/2008  . Depression, major, severe recurrence ([place]) 04/06/2008  . DM (diabetes mellitus) type II uncontrolled, periph vascular disorder ([place]) 04/02/2007  . Hyperlipidemia associated with type 2 diabetes mellitus ([place]) 01/08/2007  . COPD exacerbation ([place]) 01/08/2007    Past Surgical History:  Procedure Laterality Date  . ABDOMINAL HYSTERECTOMY  1997   secondary to uterine fibroids  . AMPUTATION Left 08/31/2013   Procedure: AMPUTATION RAY;  Surgeon: Newt Minion, MD;  Location: [facility];  Service: Orthopedics;  Laterality: Left;  Left Foot 5th Ray Amputation  . AMPUTATION Left 09/28/2013   Procedure: Left Midfoot amputation;  Surgeon: Newt Minion, MD;  Location: [facility];  Service: Orthopedics;  Laterality: Left;  Left Midfoot amputation  . AMPUTATION Left 10/14/2013   Procedure: AMPUTATION BELOW KNEE- left;  Surgeon: Newt Minion, MD;  Location: [facility];  Service: Orthopedics;  Laterality: Left;  Left Below Knee Amputation   . BLADDER SURGERY     bladder reconstruction surgery  . BREAST BIOPSY     multiple-benign per pt  . ESOPHAGOGASTRODUODENOSCOPY N/A 09/20/2013   Procedure: ESOPHAGOGASTRODUODENOSCOPY (EGD);  Surgeon: Jerene Bears, MD;  Location: [facility];  Service: Gastroenterology;  Laterality: N/A;  . FOOT AMPUTATION THROUGH METATARSAL Left 09/28/2013  . GANGLION CYST EXCISION     multiple  . OTHER SURGICAL HISTORY      stents in lower ext  . SHOULDER ARTHROSCOPY W/ ROTATOR CUFF REPAIR Bilateral   . SKIN SPLIT GRAFT Bilateral 05/13/2013   Procedure: Right and Left Foot Allograft Skin Graft;  Surgeon: Newt Minion, MD;  Location: [facility];  Service: Orthopedics;  Laterality: Bilateral;  Right and Left Foot Allograft Skin Graft  . TEE WITHOUT CARDIOVERSION N/A 01/31/2013   Procedure: TRANSESOPHAGEAL ECHOCARDIOGRAM (TEE);  Surgeon: Fay Records, MD;  Location: [facility];  Service: Cardiovascular;  Laterality: N/A;  Rm [phone]  . TEE WITHOUT CARDIOVERSION N/A 03/10/2013   Procedure: TRANSESOPHAGEAL ECHOCARDIOGRAM (TEE);  Surgeon: Larey Dresser, MD;  Location: [facility];  Service: Cardiovascular;  Laterality: N/A;  Rm. [phone]  . TOE AMPUTATION Left 08/31/2013   4TH & 5 TH TOE   . TONSILLECTOMY    . TUBAL LIGATION    . WRIST SURGERY Right    "for tumors" (09/28/2013)    OB History    No data available       Home Medications    Prior to Admission  medications   Medication Sig Start Date End Date Taking? Authorizing Provider  ADVAIR DISKUS 250-50 MCG/DOSE AEPB TAKE 1 INHALATION BY MOUTH TWICE DAILY 09/03/16   Bartholomew Crews, MD  albuterol (PROVENTIL) (2.5 MG/3ML) 0.083% nebulizer solution INHALE THE CONTENTS OF 1 VIAL VIA NEBULIZER EVERY 6 HOURS AS NEEDED FOR WHEEZING 12/03/16   Axel Filler, MD  amLODipine (NORVASC) 10 MG tablet TAKE 1 TABLET BY MOUTH EVERY DAY 10/01/16   Bartholomew Crews, MD  atenolol (TENORMIN) 100 MG tablet Take 1 tablet (100 mg total) by mouth daily. 07/23/16   Bartholomew Crews, MD  baclofen (LIORESAL) 10 MG tablet Take 1 tablet (10 mg total) by mouth 3 (three) times daily as needed for muscle spasms. Reported on 06/27/2016 07/23/16   Bartholomew Crews, MD  benazepril (LOTENSIN) 40 MG tablet Take 40 mg by mouth daily. 11/26/16   Historical Provider, MD  benzonatate (TESSALON PERLES) 100 MG capsule Take 1 capsule (100 mg total) by mouth every 6 (six) hours as needed for  cough. Patient not taking: Reported on 12/02/2016 08/07/16 08/07/17  Bartholomew Crews, MD  diphenhydrAMINE (BENADRYL) 25 MG tablet Take 2 tablets (50 mg total) by mouth at bedtime as needed. Patient taking differently: Take 50 mg by mouth at bedtime.  06/08/16   Riccardo Dubin, MD  EASY TOUCH PEN NEEDLES 31G X 8 MM MISC USE TO INJECT INSULIN TWICE DAILY 09/03/16   Bartholomew Crews, MD  fluticasone (FLONASE) 50 MCG/ACT nasal spray PLACE 1 SPRAY INTO BOTH NOSTRILS DAILY Patient taking differently: PLACE 1 SPRAY INTO BOTH NOSTRILS DAILY AS NEEDED FOR CONGESTION 07/31/16   Bartholomew Crews, MD  furosemide (LASIX) 40 MG tablet Take 1 tablet (40 mg total) by mouth daily as needed. 07/23/16   Bartholomew Crews, MD  hydrochlorothiazide (HYDRODIURIL) 25 MG tablet Take 1 tablet (25 mg total) by mouth daily. 07/23/16   Bartholomew Crews, MD  Insulin Lispro Prot & Lispro (HUMALOG MIX 75/25 KWIKPEN) (75-25) 100 UNIT/ML Kwikpen Inject 140 units subcutaneously before breakfast and 85 units before supper 12/03/16   Axel Filler, MD  ipratropium (ATROVENT) 0.02 % nebulizer solution USE 1 VIAL VIA NEBULIZER EVERY 6 HOURS AS NEEDED FOR WHEEZING 09/26/16   Shela Leff, MD  lidocaine (LIDODERM) 5 % Place 1 patch onto the skin daily. Remove & Discard patch within 12 hours or as directed by MD Patient not taking: Reported on 12/02/2016 10/18/15   Bartholomew Crews, MD  LYRICA 200 MG capsule TAKE 1 CAPSULE BY MOUTH THREE TIMES A DAY 12/31/16   Bartholomew Crews, MD  metFORMIN (GLUCOPHAGE XR) 500 MG 24 hr tablet Take 1 tablet (500 mg total) by mouth daily with breakfast. 06/30/16 06/30/17  Holley Raring, MD  nystatin (MYCOSTATIN) 100000 UNIT/ML suspension Take 5 mLs (500,000 Units total) by mouth 4 (four) times daily. 08/07/16   Bartholomew Crews, MD  nystatin (MYCOSTATIN) powder APPLY TO AFFECTED AREA THREE TIMES A DAY 05/26/16   Bartholomew Crews, MD  [facility] DELICA LANCETS [id number] MISC USE TO CHECK BLOOD  SUGARS FOUR TIMES A DAY 05/29/16   Bartholomew Crews, MD  [facility] VERIO test strip USE 3-4 TIMES DAILY TO CHECK BLOOD SUGAR 10/01/16   Bartholomew Crews, MD  oxyCODONE-acetaminophen (PERCOCET/ROXICET) 5-325 MG tablet Take 1-2 tablets by mouth every 4 (four) hours as needed for severe pain. 01/01/17   Bartholomew Crews, MD  pantoprazole (PROTONIX) 40 MG tablet TAKE 1 TABLET (40 MG TOTAL) BY  MOUTH DAILY. 12/23/16 12/23/17  Bartholomew Crews, MD  potassium chloride SA (K-DUR,KLOR-CON) 20 MEQ tablet TAKE 2 TABLETS BY MOUTH DAILY 05/26/16   Bartholomew Crews, MD  predniSONE (DELTASONE) 20 MG tablet Take 2 tablets (40 mg total) by mouth daily with breakfast. 11/30/16   Maryellen Pile, MD  PROAIR HFA 108 [phone] Base) MCG/ACT inhaler INHALE 2 PUFFS BY MOUTH EVERY 6 HOURS AS NEEDED FOR WHEEZING Patient taking differently: INHALE 2-4 PUFFS BY MOUTH EVERY 3 HOURS AS NEEDED FOR WHEEZING 11/12/16   Bartholomew Crews, MD  ranitidine (ZANTAC) 150 MG tablet Take 1 tablet (150 mg total) by mouth 2 (two) times daily. 07/23/16   Bartholomew Crews, MD  rosuvastatin (CRESTOR) 20 MG tablet Take 20 mg by mouth at bedtime. 11/26/16   Historical Provider, MD  sertraline (ZOLOFT) 100 MG tablet Take 1 tablet (100 mg total) by mouth daily. 05/26/16   Bartholomew Crews, MD  traZODone (DESYREL) 100 MG tablet TAKE 1 TABLET BY MOUTH DAILY AT BEDTIME AS NEEDED FOR SLEEP Patient taking differently: Take 100 mg by mouth at bedtime as needed for sleep.  07/23/16   Bartholomew Crews, MD  varenicline (CHANTIX) 0.5 MG tablet Take one pill once a day for 3 days. Then one pill twice a day for 4 days. Then quit smoking and go to higher dose Patient not taking: Reported on 12/02/2016 08/07/16   Bartholomew Crews, MD  varenicline (CHANTIX) 1 MG tablet Take 1 tablet (1 mg total) by mouth 2 (two) times daily. Take this after you complete the 0.5 mg dose Patient not taking: Reported on 12/02/2016 08/07/16   Bartholomew Crews, MD    Family  History Family History  Problem Relation Age of Onset  . Diverticulosis Mother   . Diabetes Mother   . Hypertension Mother   . Congestive Heart Failure Mother   . Asthma Father   . CAD Sister 27    MI at age 4 per patient.  However, she has not had a stent or CABG.   . Heart disease Sister     before age 80    Social History Social History  Substance Use Topics  . Smoking status: Current Every Day Smoker    Packs/day: 0.50    Years: 44.00    Types: Cigarettes  . Smokeless tobacco: Never Used     Comment: 1PPD  . Alcohol use No     Allergies   Abilify [aripiprazole]; Iohexol; Ivp dye [iodinated diagnostic agents]; and Morphine sulfate   Review of Systems Review of Systems  Unable to perform ROS: Mental status change     Physical Exam Updated Vital Signs BP 180/80 (BP Location: Right Arm)   Pulse 86   Temp 99.1 F (37.3 C) (Oral)   Resp 20   SpO2 96%   Physical Exam  Constitutional: She appears well-developed and well-nourished. She appears lethargic.  Non-toxic appearance. No distress.  HENT:  Head: Normocephalic and atraumatic.  Eyes: Conjunctivae, EOM and lids are normal. Pupils are equal, round, and reactive to light.  Neck: Normal range of motion. Neck supple. No tracheal deviation present. No thyroid mass present.  Cardiovascular: Normal rate, regular rhythm and normal heart sounds.  Exam reveals no gallop.   No murmur heard. Pulmonary/Chest: Effort normal and breath sounds normal. No stridor. No respiratory distress. She has no decreased breath sounds. She has no wheezes. She has no rhonchi. She has no rales.  Abdominal: Soft. Normal appearance and bowel sounds  are normal. She exhibits no distension. There is no tenderness. There is no rebound and no CVA tenderness.  Musculoskeletal: Normal range of motion. She exhibits no edema or tenderness.  Neurological: She appears lethargic. She is disoriented. She displays no tremor. No cranial nerve deficit or  sensory deficit. GCS eye subscore is 4. GCS verbal subscore is 4. GCS motor subscore is 5.  Skin: Skin is warm and dry. No abrasion and no rash noted.  Psychiatric: Her affect is labile. Her speech is tangential. She is slowed. She is inattentive.  Nursing note and vitals reviewed.    ED Treatments / Results  Labs (all labs ordered are listed, but only abnormal results are displayed) Labs Reviewed - No data to display  EKG  EKG Interpretation  Date/Time:  Tuesday January 06 2017 19:13:33 EST Ventricular Rate:  79 PR Interval:    QRS Duration: 76 QT Interval:  392 QTC Calculation: 450 R Axis:   51 Text Interpretation:  Sinus rhythm Atrial premature complexes Anteroseptal infarct, age indeterminate Baseline wander in lead(s) I II aVR No significant change since last tracing Confirmed by Americo Vallery  MD, Ronny Korff ([id number]) on 01/06/2017 9:39:10 PM       Radiology No results found.  Procedures Procedures (including critical care time)  Medications Ordered in ED Medications - No data to display   Initial Impression / Assessment and Plan / ED Course  I have reviewed the triage vital signs and the nursing notes.  Pertinent labs & imaging results that were available during my care of the patient were reviewed by me and considered in my medical decision making (see chart for details).  Clinical Course     Patient is maintaining her airway at this time. She is slightly directable. Head CT is without acute findings. She is not febrile here. She moves her neck freely in and do not think that she has meningitis. She's had no new medications. Unsure as to what is causing her altered mental status at this point. Urinalysis pending. Will be admitted to stepdown Final Clinical Impressions(s) / ED Diagnoses   Final diagnoses:  None    New Prescriptions New Prescriptions   No medications on file     Lacretia Leigh, MD 01/06/17 2219

## 2017-01-06 NOTE — H&P (Signed)
Date: 01/07/2017               Patient Name:  Jennifer [name] MRN: [id number]  DOB: 10-Sep-1961 Age / Sex: 56 y.o., female   PCP: Bartholomew Crews, MD         Medical Service: Internal Medicine Teaching Service         Attending Physician: Dr. Lucious Groves, DO    First Contact: Dr. Inda Castle Pager: [phone]  Second Contact: Dr. Juleen China Pager: [phone]       After Hours (After 5p/  First Contact Pager: [phone]  weekends / holidays): Second Contact Pager: [phone]   Chief Complaint: altered mental status  History of Present Illness:  Jennifer [name] is a 56yo female with PMH of T2DM, MDD, HTN, GERD, BKA, Chronic diastolic CHF, COPD, h/o endocarditis, PVD, presenting to the [facility] after a fall 3 days prior and subsequent change in mental status, vomiting and diarrhea.  On evaluation, patient was very restless and unable to fully participate in interview. What she would provide is that she is having pain all over, had a fall awhile ago and is having headaches. She could not state quality of headache, when they began or if she is having neurological symptoms.   Per chart review, patient recently had MRI of R foot through [facility] and was found to have osteomyelitis of the proximal and middle phalanges of the fifth digit with septic PIP joint and bony destruction of the head of the proximal phalanx. It was noted that wound care clinic would insert PICC and start IV antibiotics. The MRI and original MRI report are not available.   Family was not available when we evaluated the patient as they had already left. We attempted calling at phone numbers provided without answer.  Meds:  Current Meds  Medication Sig  . ADVAIR DISKUS 250-50 MCG/DOSE AEPB TAKE 1 INHALATION BY MOUTH TWICE DAILY  . albuterol (PROVENTIL) (2.5 MG/3ML) 0.083% nebulizer solution INHALE THE CONTENTS OF 1 VIAL VIA NEBULIZER EVERY 6 HOURS AS NEEDED FOR WHEEZING  Atrovent Norvasc 64m daily Atenolol 1061m daily Benazepril 4063maily Crestor 104m55mily Lasix 40mg81mly PRN KDUR 40mg 70my Baclofen 10mg T38mRN Oxycodone-APAP 5-325 (1-2 q4hrs PRN) Lyrica 200mg TI60mtformin 500mg dai52movolog mix 75/25 140U qAM, 85U qPM Pantoprazole 40mg dail82mnitidine 150mg BID D12mnhydramine 50mg qhs PR1mazodone 100mg qhs Zol1m100mg daily Ch41mx?  Allergies: Allergies as of 01/06/2017 - Review Complete 01/01/2017  Allergen Reaction Noted  . Abilify [aripiprazole] Other (See Comments) 11/09/2015  . Iohexol  08/17/2007  . Ivp dye [iodinated diagnostic agents]  05/10/2013  . Morphine sulfate Itching and Rash    Past Medical History:  Diagnosis Date  . Anginal pain (HCC)    '3' [facility] ischemia ruled out 9/9   . Arthritis of lumbar spine (HCC)   . [place] . CHF (congestive heart failure) (HCC)   . [place] rupture (HCC) 01/2013  Friedens[place] of   . Chronic bronchitis (HCC)    "I get[place] alot" (09/28/2013)  . Chronic diastolic heart failure (HCC)    grade 2 per 2D echocardiogram (01/2013)  . Chronic lower back pain   . Chronic osteomyelitis of foot (HCC)    chronic, right secondary to diabetic foot ulcers  . Chronic pain syndrome 12/03/2011   Likely secondary to depression, "fibromyalgia", neuropathy, and obesity. Lumbar MRI 2014 no sig change from prior (2008) : Stable hypertrophic facet disease  most notable at L4-5. Stable shallow left foraminal/extraforaminal disc protrusion at L4-5. No direct neural compression.      Marland Kitchen COPD 01/08/2007   PFT's 05/2007 : FEV1/FVC 82, FEV1 64% pred, FEF 25-75% 40% predicted, 16% improvement in FEV1 with bronchodilators.     . Depression   . Diabetic peripheral neuropathy ([place])   . DVT of upper extremity (deep vein thrombosis) ([place]) 03/11/2013   Secondary to PICC line. Right brachial vein, diagnosed on 03/10/2013 Coumadin for 3 months. End date 06/10/2013   . Environmental allergies    Hx: of  . Exertional shortness of breath   . Fatty liver 2003    observed on ultrasound abdomen  . Fibromyalgia   . GERD (gastroesophageal reflux disease)   . Glaucoma   . Hyperlipidemia   . Hyperplastic colon polyp 12/2010   Per colonoscopy (12/2010) - Dr. Deatra Ina  . Hypertension   . 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 Blood cultures 01/26/13 S. Aureus and GBS. Blood cultures Feb 6th, 8th, and 9th and March were negative.Repeat TEE 3/20 negative for vegitations  . 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 2/2 tobacco use, DM, and cont weight bearing on surgical wound and developed gangrene   . Polymicrobial bacterial infection 01/2013   GBS and S. aureus bacteremia // Source likely infected diabetic foot ulcer  . PVD (peripheral vascular disease) with claudication ([place])    Stents to bilateral common iliac arteries (left 2005, right 2008), on chronic plavix  . Rheumatoid arthritis ([place])   . Tobacco abuse   . Type II diabetes mellitus with peripheral circulatory disorders, uncontrolled DX: 1993   Insulin dep. Poor control. Complicated by diabetic foot ulcer and diabetic eye disease.    Marland Kitchen Ulcer of foot, chronic (HCC)    Left. No OM per MRI (01/2013)   Family History: Mother with DM, HTN, CHF; Father with Asthma; Sister with MI at age 18 but no stent or CABG  Social History: 1ppd smoker; no alcohol use; history of marijuana and cocaine abuse  Review of Systems: A complete ROS was negative except as per HPI.  Physical Exam: Blood pressure (!) 158/103, pulse 82, temperature 99.1 F (37.3 C), temperature source Rectal, resp. rate 13, SpO2 96 %. General: alert, restless, uncooperative with exam  Head: normocephalic and atraumatic.  Eyes: unable to evaluate due to restlessness Mouth: pharynx pink and moist.  Neck: supple, full ROM..  Lungs: normal respiratory effort, no accessory muscle use, normal breath sounds, no  crackles, and no wheezes. Heart: normal rate, regular rhythm.  Abdomen: soft, mild diffuse tenderness Msk: left BKA; right 5th toe with necrosis - nonerythematous, nontender in surrounding area  Pulses: 2+ DP on left. Radial pulses intact bilaterally Extremities: No cyanosis, clubbing, edema Neurologic: alert & oriented X1, unable to complete full neuro exam due to patient noncooperation and restlessness, will answer yes or no though sometimes not appropriately, no asterixis.  Skin: turgor normal and no rashes; chronic skin changes on anterior left lower extremity. Perianal erythema without skin excoriation Psych: Oriented X1, anxious appearing, restless  EKG: Personally reviewed - Sinus rhythm, no acute ST or T wave changes  CXR: Personally reviewed - stable cardiomegaly, stable bronchitic changes. RUL granuloma  CT head and cervical spine: No acute changes  Labs: Na 141, K 3.3, Cl 102, CO2 26, BUN 12, Cr 0.84, Glu 224 AST 139, ALT 39,  Alk phos 81, Bili 1.3 WBC 10.9, Hgb 14.3, Hct 43.8, Plts 153 EtOH negative LA 1.75 Trop 0.08 UDS negative (on oxycodone at home)  HIV negative 10/2016 Hep C negative 2010  Assessment & Plan by Problem: Active Problems:   Altered mental status  Encephalopathy: Patient presenting with altered mental status from baseline. She is on several sedating medicines at home which could be contributing. Her UDS was negative even for opiates; unable to get full history of last couple of days, but if she had not been taking them since her fall and change in mentation she could be having withdrawal symptoms presenting as restlessness, nausea, vomiting, and diarrhea. She also has a chronic right toe wound presumably with osteomyelitis that is not treated yet. She has a mild elevation in her WBCs, CXR and UA not concerning for pneumonia or UTI. EtOH level was negative. --f/u BCx, UCx --f/u Heavy metals panel --f/u TSH --f/u ammonia --f/u AM CBC --hold sedating  meds  Osteomyelitis of right 5th toe: Patient with chronic wound on right 5th toe that was being managed by wound care and has per notes escalated to osteomyelitis. Bone biopsy from 01/02/17 confirms osteomyelitis; no cultures were found. Toe necrotic on exam; there does not appear to be associated cellulitis or stranding. --f/u BCx --Cefepime and vancomycin --consult ortho in AM  Hypokalemia: Patient with K of 3.3 on admission. At home she is on Lasix and 38mq KDUR. --336m KCL IV --f/u AM CMP  Elevated troponin: Patient with initial troponin of 0.08, with no complaints that we could elicit of chest pain or shortness of breath. She has been having nausea and vomiting per ED documentation. EKG without acute ischemic changes. --trend troponins  HTN: Patient with history of uncontrolled HTN on amlodipine 1035maily, atenolol 100m30mily and benazepril 40mg14mly. BP 158/103 at evaluation --Continue home regimen in AM after re-evaluation of mental status and bedside swallow --will restart earlier if BP continues to rise  Chronic diastolic congestive heart failure: Last ECHO was 08/2013 showing EF 65-70% and G1DD without regional wall abnormalities. Patient has non pitting LE edema that per documentation appears chronic and no crackles were appreciated on lung exam.. No oxygen requirement and CXR does not demonstrate worsening cardiomegaly or congestion.  --Resume home atenolol 100mg 67my and benazepril 40mg d39m once patient safe to take PO --Lasix 40mg da6mPRN as above  COPD: Patient on home regimen of advair, atrovent and albuterol. On exam she does not require oxygen, is breathing without increased effort or respiration, and lung exam is clear of wheezing.  --Duoneb q6hrs --Dulera BID  T2DM On home regimen of Novolog Mix 75/25 140U qAM, 85U qPM and metformin 500mg dai77mCBG on admission was 225. --NPO --SSI-M for now  MDD: Patient with MDD on home regimen of zoloft 100mg  dai29m--hold zoloft for now   GERD: Patient on pantoprazole 40mg daily87m ranitidine 150mg BID at49me --restart after patient tolerating PO  Diet: NPO for now IVF: 100ml/hr x8hr36mVT/PE: Lovenox Code: Full from prior; patient unable to participate in conversation and family unavailable for discussion. Please re-address with patient and family as able.   Dispo: Admit patient to Inpatient with expected length of stay greater than 2 midnights.  Signed: Bethany Cumming SvalinAlphonzo Grieve8, 12:22 AM  Pager [phone]

## 2017-01-07 ENCOUNTER — Other Ambulatory Visit: Payer: Self-pay

## 2017-01-07 DIAGNOSIS — Z89512 Acquired absence of left leg below knee: Secondary | ICD-10-CM

## 2017-01-07 DIAGNOSIS — M86671 Other chronic osteomyelitis, right ankle and foot: Secondary | ICD-10-CM

## 2017-01-07 DIAGNOSIS — F17210 Nicotine dependence, cigarettes, uncomplicated: Secondary | ICD-10-CM

## 2017-01-07 DIAGNOSIS — E1151 Type 2 diabetes mellitus with diabetic peripheral angiopathy without gangrene: Secondary | ICD-10-CM

## 2017-01-07 DIAGNOSIS — J449 Chronic obstructive pulmonary disease, unspecified: Secondary | ICD-10-CM

## 2017-01-07 DIAGNOSIS — Z794 Long term (current) use of insulin: Secondary | ICD-10-CM

## 2017-01-07 DIAGNOSIS — Z833 Family history of diabetes mellitus: Secondary | ICD-10-CM

## 2017-01-07 DIAGNOSIS — R778 Other specified abnormalities of plasma proteins: Secondary | ICD-10-CM

## 2017-01-07 DIAGNOSIS — Z79899 Other long term (current) drug therapy: Secondary | ICD-10-CM

## 2017-01-07 DIAGNOSIS — K219 Gastro-esophageal reflux disease without esophagitis: Secondary | ICD-10-CM

## 2017-01-07 DIAGNOSIS — Z825 Family history of asthma and other chronic lower respiratory diseases: Secondary | ICD-10-CM

## 2017-01-07 DIAGNOSIS — G9340 Encephalopathy, unspecified: Principal | ICD-10-CM

## 2017-01-07 DIAGNOSIS — Z8249 Family history of ischemic heart disease and other diseases of the circulatory system: Secondary | ICD-10-CM

## 2017-01-07 DIAGNOSIS — Z885 Allergy status to narcotic agent status: Secondary | ICD-10-CM

## 2017-01-07 DIAGNOSIS — Z91041 Radiographic dye allergy status: Secondary | ICD-10-CM

## 2017-01-07 DIAGNOSIS — E876 Hypokalemia: Secondary | ICD-10-CM

## 2017-01-07 DIAGNOSIS — I152 Hypertension secondary to endocrine disorders: Secondary | ICD-10-CM

## 2017-01-07 DIAGNOSIS — Z888 Allergy status to other drugs, medicaments and biological substances status: Secondary | ICD-10-CM

## 2017-01-07 DIAGNOSIS — F329 Major depressive disorder, single episode, unspecified: Secondary | ICD-10-CM

## 2017-01-07 DIAGNOSIS — Z7951 Long term (current) use of inhaled steroids: Secondary | ICD-10-CM

## 2017-01-07 DIAGNOSIS — B9689 Other specified bacterial agents as the cause of diseases classified elsewhere: Secondary | ICD-10-CM

## 2017-01-07 LAB — INFLUENZA PANEL BY PCR (TYPE A & B)
Influenza A By PCR: NEGATIVE
Influenza B By PCR: NEGATIVE

## 2017-01-07 LAB — C DIFFICILE QUICK SCREEN W PCR REFLEX
C Diff antigen: NEGATIVE
C Diff interpretation: NOT DETECTED
C Diff toxin: NEGATIVE

## 2017-01-07 LAB — COMPREHENSIVE METABOLIC PANEL
ALT: 43 U/L (ref 14–54)
AST: 151 U/L — ABNORMAL HIGH (ref 15–41)
Albumin: 3.4 g/dL — ABNORMAL LOW (ref 3.5–5.0)
Alkaline Phosphatase: 80 U/L (ref 38–126)
Anion gap: 14 (ref 5–15)
BUN: 13 mg/dL (ref 6–20)
CO2: 26 mmol/L (ref 22–32)
Calcium: 9.2 mg/dL (ref 8.9–10.3)
Chloride: 103 mmol/L (ref 101–111)
Creatinine, Ser: 0.86 mg/dL (ref 0.44–1.00)
GFR calc Af Amer: >60 mL/min (ref >60)
GFR calc non Af Amer: >60 mL/min (ref >60)
Glucose, Bld: 207 mg/dL — ABNORMAL HIGH (ref 65–99)
Potassium: 3.2 mmol/L — ABNORMAL LOW (ref 3.5–5.1)
Sodium: 143 mmol/L (ref 135–145)
Total Bilirubin: 1.4 mg/dL — ABNORMAL HIGH (ref 0.3–1.2)
Total Protein: 7 g/dL (ref 6.5–8.1)

## 2017-01-07 LAB — CBC
HCT: 42.2 % (ref 36.0–46.0)
Hemoglobin: 14 g/dL (ref 12.0–15.0)
MCH: 31.5 pg (ref 26.0–34.0)
MCHC: 33.2 g/dL (ref 30.0–36.0)
MCV: 94.8 fL (ref 78.0–100.0)
Platelets: 159 10*3/uL (ref 150–400)
RBC: 4.45 MIL/uL (ref 3.87–5.11)
RDW: 15.2 % (ref 11.5–15.5)
WBC: 11.2 10*3/uL — ABNORMAL HIGH (ref 4.0–10.5)

## 2017-01-07 LAB — APTT: aPTT: 30 seconds (ref 24–36)

## 2017-01-07 LAB — TROPONIN I
Troponin I: 0.09 ng/mL (ref <0.03)
Troponin I: 0.11 ng/mL (ref <0.03)
Troponin I: 0.06 ng/mL (ref <0.03)

## 2017-01-07 LAB — AMMONIA: Ammonia: 34 umol/L (ref 9–35)

## 2017-01-07 LAB — PROTIME-INR
INR: 1.16
Prothrombin Time: 14.9 seconds (ref 11.4–15.2)

## 2017-01-07 LAB — GLUCOSE, CAPILLARY
Glucose-Capillary: 175 mg/dL — ABNORMAL HIGH (ref 65–99)
Glucose-Capillary: 147 mg/dL — ABNORMAL HIGH (ref 65–99)

## 2017-01-07 LAB — VANCOMYCIN, RANDOM: Vancomycin Rm: <4

## 2017-01-07 LAB — CK: Total CK: 5058 U/L — ABNORMAL HIGH (ref 38–234)

## 2017-01-07 LAB — TSH: TSH: 1.516 u[IU]/mL (ref 0.350–4.500)

## 2017-01-07 LAB — VITAMIN B12: Vitamin B-12: 281 pg/mL (ref 180–914)

## 2017-01-07 LAB — MRSA PCR SCREENING: MRSA by PCR: NEGATIVE

## 2017-01-07 MED ORDER — POTASSIUM CHLORIDE 2 MEQ/ML IV SOLN
30.0000 meq | Freq: Once | INTRAVENOUS | Status: AC
  Administered 2017-01-07: 30 meq via INTRAVENOUS
  Filled 2017-01-07: qty 15

## 2017-01-07 MED ORDER — IPRATROPIUM-ALBUTEROL 0.5-2.5 (3) MG/3ML IN SOLN
3.0000 mL | Freq: Three times a day (TID) | RESPIRATORY_TRACT | Status: DC
Start: 2017-01-08 — End: 2017-01-10
  Administered 2017-01-08 – 2017-01-10 (×5): 3 mL via RESPIRATORY_TRACT
  Filled 2017-01-07 (×9): qty 3

## 2017-01-07 MED ORDER — FENTANYL CITRATE (PF) 100 MCG/2ML IJ SOLN
25.0000 ug | INTRAMUSCULAR | Status: DC | PRN
  Administered 2017-01-08: 50 ug via INTRAVENOUS
  Filled 2017-01-07: qty 2

## 2017-01-07 MED ORDER — MOMETASONE FURO-FORMOTEROL FUM 200-5 MCG/ACT IN AERO
2.0000 | INHALATION_SPRAY | Freq: Two times a day (BID) | RESPIRATORY_TRACT | Status: DC
  Administered 2017-01-08 – 2017-01-12 (×8): 2 via RESPIRATORY_TRACT
  Filled 2017-01-07 (×2): qty 8.8

## 2017-01-07 MED ORDER — ROSUVASTATIN CALCIUM 20 MG PO TABS
20.0000 mg | ORAL_TABLET | Freq: Every day | ORAL | Status: DC
  Administered 2017-01-08 – 2017-01-11 (×5): 20 mg via ORAL
  Filled 2017-01-07 (×5): qty 1

## 2017-01-07 MED ORDER — MIDAZOLAM HCL 2 MG/2ML IJ SOLN: 1.0000 mg | INTRAMUSCULAR | Status: DC | PRN

## 2017-01-07 MED ORDER — IPRATROPIUM-ALBUTEROL 0.5-2.5 (3) MG/3ML IN SOLN
3.0000 mL | Freq: Four times a day (QID) | RESPIRATORY_TRACT | Status: DC
  Administered 2017-01-07 (×3): 3 mL via RESPIRATORY_TRACT
  Filled 2017-01-07 (×3): qty 3

## 2017-01-07 MED ORDER — DEXTROSE 5 % IV SOLN
2.0000 g | Freq: Three times a day (TID) | INTRAVENOUS | Status: DC
  Administered 2017-01-07 – 2017-01-09 (×7): 2 g via INTRAVENOUS
  Filled 2017-01-07 (×8): qty 2

## 2017-01-07 MED ORDER — ACETAMINOPHEN 650 MG RE SUPP: 650.0000 mg | Freq: Four times a day (QID) | RECTAL | Status: DC | PRN

## 2017-01-07 MED ORDER — PANTOPRAZOLE SODIUM 40 MG PO TBEC
40.0000 mg | DELAYED_RELEASE_TABLET | Freq: Every day | ORAL | Status: DC
  Administered 2017-01-09 – 2017-01-12 (×4): 40 mg via ORAL
  Filled 2017-01-07 (×4): qty 1

## 2017-01-07 MED ORDER — THIAMINE HCL 100 MG/ML IJ SOLN
100.0000 mg | Freq: Every day | INTRAMUSCULAR | Status: DC
  Administered 2017-01-08 – 2017-01-12 (×5): 100 mg via INTRAVENOUS
  Filled 2017-01-07 (×5): qty 2

## 2017-01-07 MED ORDER — ATENOLOL 50 MG PO TABS
100.0000 mg | ORAL_TABLET | Freq: Every day | ORAL | Status: DC
  Administered 2017-01-09 – 2017-01-12 (×4): 100 mg via ORAL
  Filled 2017-01-07: qty 2
  Filled 2017-01-07: qty 1
  Filled 2017-01-07 (×2): qty 2
  Filled 2017-01-07 (×2): qty 1

## 2017-01-07 MED ORDER — NYSTATIN 100000 UNIT/ML MT SUSP
5.0000 mL | Freq: Four times a day (QID) | OROMUCOSAL | Status: DC
  Administered 2017-01-08 – 2017-01-12 (×13): 500000 [IU] via ORAL
  Filled 2017-01-07 (×13): qty 5

## 2017-01-07 MED ORDER — FAMOTIDINE 20 MG PO TABS
20.0000 mg | ORAL_TABLET | Freq: Every day | ORAL | Status: DC
  Administered 2017-01-09 – 2017-01-12 (×4): 20 mg via ORAL
  Filled 2017-01-07 (×5): qty 1

## 2017-01-07 MED ORDER — BENAZEPRIL HCL 40 MG PO TABS
40.0000 mg | ORAL_TABLET | Freq: Every day | ORAL | Status: DC
  Filled 2017-01-07: qty 1

## 2017-01-07 MED ORDER — VANCOMYCIN HCL 10 G IV SOLR
2000.0000 mg | Freq: Once | INTRAVENOUS | Status: AC
  Administered 2017-01-07: 2000 mg via INTRAVENOUS
  Filled 2017-01-07: qty 2000

## 2017-01-07 MED ORDER — SODIUM CHLORIDE 0.9 % IV SOLN
INTRAVENOUS | Status: AC
  Administered 2017-01-07: 04:00:00 via INTRAVENOUS

## 2017-01-07 MED ORDER — FUROSEMIDE 40 MG PO TABS: 40.0000 mg | ORAL_TABLET | Freq: Every day | ORAL | Status: DC

## 2017-01-07 MED ORDER — FLUTICASONE PROPIONATE 50 MCG/ACT NA SUSP
1.0000 | Freq: Every day | NASAL | Status: DC | PRN
Start: 2017-01-07 — End: 2017-01-12

## 2017-01-07 MED ORDER — INSULIN ASPART 100 UNIT/ML ~~LOC~~ SOLN
0.0000 [IU] | Freq: Three times a day (TID) | SUBCUTANEOUS | Status: DC
  Administered 2017-01-07 – 2017-01-08 (×2): 3 [IU] via SUBCUTANEOUS
  Administered 2017-01-08 (×2): 5 [IU] via SUBCUTANEOUS
  Administered 2017-01-09: 11 [IU] via SUBCUTANEOUS
  Administered 2017-01-09: 2 [IU] via SUBCUTANEOUS
  Administered 2017-01-09: 3 [IU] via SUBCUTANEOUS
  Administered 2017-01-10: 5 [IU] via SUBCUTANEOUS

## 2017-01-07 MED ORDER — VANCOMYCIN HCL IN DEXTROSE 1-5 GM/200ML-% IV SOLN
1000.0000 mg | Freq: Two times a day (BID) | INTRAVENOUS | Status: DC
  Administered 2017-01-08 – 2017-01-09 (×4): 1000 mg via INTRAVENOUS
  Filled 2017-01-07 (×4): qty 200

## 2017-01-07 MED ORDER — AMLODIPINE BESYLATE 10 MG PO TABS
10.0000 mg | ORAL_TABLET | Freq: Every day | ORAL | Status: DC
  Administered 2017-01-09 – 2017-01-12 (×4): 10 mg via ORAL
  Filled 2017-01-07 (×5): qty 1

## 2017-01-07 MED ORDER — POTASSIUM CHLORIDE CRYS ER 20 MEQ PO TBCR
40.0000 meq | EXTENDED_RELEASE_TABLET | Freq: Every day | ORAL | Status: DC
  Administered 2017-01-09 – 2017-01-12 (×4): 40 meq via ORAL
  Filled 2017-01-07 (×6): qty 2

## 2017-01-07 MED ORDER — SODIUM CHLORIDE 0.9 % IV SOLN
INTRAVENOUS | Status: DC
  Administered 2017-01-07 – 2017-01-08 (×2): via INTRAVENOUS

## 2017-01-07 MED ORDER — ACETAMINOPHEN 325 MG PO TABS
650.0000 mg | ORAL_TABLET | Freq: Four times a day (QID) | ORAL | Status: DC | PRN
  Administered 2017-01-09 – 2017-01-10 (×2): 650 mg via ORAL
  Filled 2017-01-07 (×2): qty 2

## 2017-01-07 MED ORDER — ENOXAPARIN SODIUM 60 MG/0.6ML ~~LOC~~ SOLN
60.0000 mg | SUBCUTANEOUS | Status: DC
  Administered 2017-01-07 – 2017-01-12 (×6): 60 mg via SUBCUTANEOUS
  Filled 2017-01-07 (×6): qty 0.6

## 2017-01-07 NOTE — Progress Notes (Signed)
SLP Cancellation Note  Patient Details Name: Jennifer [name] MRN: [id number] DOB: 1961/07/05   Cancelled treatment:       Reason Eval/Treat Not Completed: Patient's level of consciousness. Discussed with RN. Will plan to f/u tomorrow.  Herbie [place], [place] CCC-SLP [phone]    Lynann Beaver 01/07/2017, 11:43 AM

## 2017-01-07 NOTE — ED Notes (Addendum)
Potassium infusion paused @0140  due to pt movement within bed causing pump to repeatedly stop. Restarted at 0215.

## 2017-01-07 NOTE — ED Notes (Addendum)
IV wrapped in gauze to protect from accidental removal by pt.

## 2017-01-07 NOTE — H&P (Signed)
Internal Medicine Attending Admission Note  I saw and evaluated the patient. I reviewed the resident's note and I agree with the resident's findings and plan as documented in the resident's note.  Assessment & Plan by Problem:   Acute encephalopathy - Unclear at this time what is causing her acute encephalopathy.  Her elevated CK is more likely due to her fall and possibly down time, her vitamin B12 level is a little low but does not clinically fit with her exam.  He do not see clear signs of severe infection (her chronic osteomyelitis is not likely the cause of her current mental status).  She has no fever or headache to suggest encephalitis.  I am concerned for a possible CVA and we will try to obtain an MRI.  Other differential includes medications, however she has not improved over the last several hours.  We do have heavy metal labs pending.  Otherwise her laboratory workup has been grossly unremarkable. - May need Neurology consult if MRI is unrevealing. - Continue to hold sedating medications -bedside sitter if available.    DM (diabetes mellitus) type II uncontrolled, periph vascular disorder ([place]) - on high does of insulin as outpatient, unclear at this time her level of compliance.  However her blood sugar is currently at our inpatient goal with SSI.  We will continue to monitor and adjust this as necessary.     Hypertension associated with diabetes ([place]) -Monitor  Chronic Osteomyelitis of right 5th toe - No signs of overt sepsis, normally would not need broadspectrum antibiotics as inpatient however I would continue this until a more clear explanation of her mental status is found.  Chief Complaint(s):AMS  History - key components related to admission: Briefly Jennifer [name] is a 56 year old female with history of uncontrolled T2DM, MDD, HTN, GERD, Left BKA due to diabetic foot ulcer, dCHF, COPD, PVD and recently diagnosed Osteomyelitis of her 5th toe on the right.  She was brought  into the ED via ambulance yesterday by family due to change in mental status.  Unfortunately family was not at bedside upon IMTS evaluation and were unable to be contacted last night.  Patient currently has a limited capacity to provide history as she answers yes and no to a series of appropriate and inappropriate questions.  From chart review she was recently seen last week by her PCP and appeared to be in her normal state of health.  It was noted that outpatient wound care had obtained an MRI and she was found to have osteomyelitis and had a plan for limb salvage with PICC line placement and parenteral antibiotics however it does not appear this ever happened.  Dr Inda Castle was able to reach her father who stated that she was at her baseline on Saturday 1/13 but he had heard that she had fallen on Sunday.  Per ED provider her son had reported to him that the symptoms had all started after this fall on the stairs on Sunday.  Lab results: Reviewed in Epic  Physical Exam - key components related to admission: General: restless in bed, frequently trying to get out, answers most questions yes or no. HEENT: PERRL, difficult to assess EOM, has disconjugate gaze Cardiac: RRR, no rubs, murmurs or gallops Pulm: clear to auscultation bilaterally, moving normal volumes of air Abd: soft, nontender, nondistended, BS present Ext: warm and well perfused, no pedal edema Neuro: alert and oriented only to self.  Moving all 4 extremities overall strength appears intact but  difficult to get patient to comply with directions.  Will follow some commands.  Vitals:   01/07/17 0545 01/07/17 0922 01/07/17 1000 01/07/17 1234  BP:  (!) 161/61  (!) 129/104  Pulse:  76  86  Resp:  19  (!) 22  Temp:  98.2 F (36.8 C)  98.1 F (36.7 C)  TempSrc:  Oral  Oral  SpO2: 95% 95% 93% 98%

## 2017-01-07 NOTE — ED Notes (Signed)
Pt continues to move about in the bed making efforts to get up from bed. RN continually telling pt to remain in bed for safety reasons.

## 2017-01-07 NOTE — Progress Notes (Addendum)
Pharmacy Antibiotic Note  Jennifer [name] is a 56 y.o. female admitted on 01/06/2017 with altered mental status.  Pharmacy has been consulted for Vancomycin/Cefepime dosing. Pt with recently diagnosed osteomyelitis as an outpatient, but had NOT started any anti-biotic therapy yet. WBC 11.2. Renal function ok.   Plan: -Vancomycin 2000 mg IV x 1, then give 1000 mg IV q12h -Cefepime 2g IV q8h -Trend WBC, temp, renal function  -Drug levels as indicated   Temp (24hrs), Avg:99.1 F (37.3 C), Min:99 F (37.2 C), Max:99.1 F (37.3 C)   Recent Labs Lab 01/06/17 1945 01/06/17 1953 01/07/17 0151  WBC 10.9*  --  11.2*  CREATININE 0.84  --  0.86  LATICACIDVEN  --  1.75  --     Estimated Creatinine Clearance: 100 mL/min (by C-G formula based on SCr of 0.86 mg/dL).    Allergies  Allergen Reactions  . Abilify [Aripiprazole] Other (See Comments)    Urinary freq Nov 2016  . Iohexol      Desc: IV CONTRAST CAUSE NEPHROPATHY IN 2007   . Ivp Dye [Iodinated Diagnostic Agents]   . Morphine Sulfate Itching and Rash   Narda Bonds 01/07/2017 3:23 AM

## 2017-01-07 NOTE — Progress Notes (Signed)
Unable to complete admission history d/t AMS

## 2017-01-07 NOTE — Consult Note (Signed)
   [facility] CM Inpatient Consult   01/07/2017  Jennifer [name] 12-13-1961 [id number]   Notified of patient's admission from [facility].  Patient is currently active with [facility] for chronic disease management services.  Patient has been engaged by a [employer] and CSW.   Pleasereveiw their notes for details.  Will follow for progress and disposition needs.  Of note, [facility] Care Management services does not replace or interfere with any services that are needed or arranged by inpatient case management or social work.  For additional questions or referrals please contact:  Natividad Brood, RN BSN [facility] Liaison  [phone] business mobile phone Toll free office [phone]

## 2017-01-07 NOTE — Progress Notes (Signed)
Subjective: Complaining of pain "all over".  Has been agitated, getting out of bed, not listening to staff instructions.  Not obviously responding to internal stimuli.  Urinating frequently including urinary incontinence.    Spoke to father Loice Godbee [phone]) on the phone.  He last saw Ms Rotman on Sat 1/13 and she was at her baseline, conversant with normal mental status.  She lives with her 2 days.  He heard from his grandson that she fell on "Sunday, and is not sure what has happened since then.  He did not have his grandson's phone numbers available, but said he would ask them to call the hospital.  Objective:  Vital signs in last 24 hours: Vitals:   01/07/17 0430 01/07/17 0545 01/07/17 0922 01/07/17 1000  BP: 178/70  (!) 161/61   Pulse: 73  76   Resp: 22  19   Temp:   98.2 F (36.8 C)   TempSrc:   Oral   SpO2: 92% 95% 95% 93%   Physical Exam  Constitutional:  Disheveled woman, moderately agitated, restlessly shifting in bed and intermittently sitting up, answering questions with few words  Eyes:  Mild proptosis  Cardiovascular: Normal rate and regular rhythm.   Pulmonary/Chest: Effort normal and breath sounds normal.  Musculoskeletal:  L BKA R 5th toe black, necrotic appearing, with small dorsolateral wound No exudate  Neurological:  Alert, oriented only to self Follows simple commands inconsistently Spontaneously moving all extremities Strength grossly intact and symmetric in bilateral UE and LE Full ROM in neck Disconjugate gaze, exotropia PERRL Normal phonation Tongue midline  Psychiatric:  Not obviously responding to internal stimuli Responds to questions with yes, no, or a few words    CBC Latest Ref Rng & Units 01/07/2017 01/06/2017 11/29/2016  WBC 4.0 - 10.5 K/uL 11.2(H) 10.9(H) 7.6  Hemoglobin 12.0 - 15.0 g/dL 14.0 14.3 14.5  Hematocrit 36.0 - 46.0 % 42.2 43.8 46.9(H)  Platelets 150 - 400 K/uL 159 153 141(L)   BMP Latest Ref Rng & Units  01/07/2017 01/06/2017 11/29/2016  Glucose 65 - 99 mg/dL 207(H) 225(H) 344(H)  BUN 6 - 20 mg/dL 13 12 14  Creatinine 0.44 - 1.00 mg/dL 0.86 0.84 1.00  BUN/Creat Ratio 9 - 23 - - -  Sodium 135 - 145 mmol/L 143 141 137  Potassium 3.5 - 5.1 mmol/L 3.2(L) 3.3(L) 4.8  Chloride 101 - 111 mmol/L 103 102 100(L)  CO2 22 - 32 mmol/L 26 26 27  Calcium 8.9 - 10.3 mg/dL 9.2 9.1 8.7(L)   Component     Latest Ref Rng & Units 01/07/2017  Vitamin B12     180 - 914 pg/mL 281   Component     Latest Ref Rng & Units 01/07/2017  CK Total     38"  - 234 U/L 5,058 (H)     Component     Latest Ref Rng & Units 01/07/2017  Ammonia     9 - 35 umol/L 34   Component     Latest Ref Rng & Units 01/06/2017 01/07/2017 01/07/2017 01/07/2017          1:51 AM  1:51 AM  7:42 AM  Troponin I     <0.03 ng/mL 0.08 (HH) 0.09 (HH) 0.11 (HH) 0.06 (HH)   Assessment/Plan:  Principal Problem:   Acute encephalopathy Active Problems:   DM (diabetes mellitus) type II uncontrolled, periph vascular disorder (HCC)   Hypertension associated with diabetes ([place])   56 y.o. female with acute encephalopathy  #  Acute Encephalopathy Mental status change sometime in the last 3 days associated with a fall of uncertain circumstances.  Differential includes stroke and toxic/metabolic encephalopathy.  Less likely infectious with no fever, mild leukocytosis, full ROM in neck.  No electrolyte abnormalities, normal ammonia, normal B12, TSH normal, negative blood EtOH level, UDS negative, no gross structural abnormalities on head CT.  Unclear if disconjugate gaze is new or if she has history of strabismus. -Thiamine -Hold deleriogenic meds (baclofen, percocet, trazodone, varenicline, diphenhydramine, pregabalin, sertraline)  -MRI brain w/ sedation -Safety sitter -f/u RPR, heavy metals -Continue trying to contact sons for further collateral -Consider Neurology consult following imaging -Consider LP -Speech eval  #Osteomyelitis Per Dr  Zenovia Jarred notes, plan for management with IV antibiotics per wound care, though she has not had PICC placed.  This seems to be unrelated to her encephalopathy and she is not septic, but will continue empiric antibiotics in case septic encephalopathy is contributing. -Continue empiric vancomycin and cefepime -F/u blood cultures -Consult ortho if mental status improves  #Rhabdomyolysis Elevated CK and urine with hemoglobin but no RBCs consistent with mild rhabdomyolysis. -IVF -Follow renal function  #Hypokalemia -Replete K -Continue home 40 mEq KCl daily -Follow electrolytes  #DM BG 100s-200s so far here.  Poor compliance with home insulin regimen suspected in the past, uncertain what she has been taking recently. -SSI  #HTN -Continue home amlodipine 10 mg daily, atenolol 100 mg daily -Hold diuretics (HCTZ 25 mg daily, benazepril 40 mg daily, lasix 40 mg daily)  Fluids: NS 100 mL/hr Diet: NPO DVT Prophylaxis: lovenox Code Status: full  Dispo: Anticipated discharge pending further diagnostic evaluation.   Minus Liberty, MD 01/07/2017, 11:16 AM Pager: [phone]

## 2017-01-08 DIAGNOSIS — M6282 Rhabdomyolysis: Secondary | ICD-10-CM

## 2017-01-08 LAB — URINE CULTURE: Culture: NO GROWTH

## 2017-01-08 LAB — CBC
HCT: 45.6 % (ref 36.0–46.0)
Hemoglobin: 14.6 g/dL (ref 12.0–15.0)
MCH: 30.5 pg (ref 26.0–34.0)
MCHC: 32 g/dL (ref 30.0–36.0)
MCV: 95.4 fL (ref 78.0–100.0)
Platelets: 149 10*3/uL — ABNORMAL LOW (ref 150–400)
RBC: 4.78 MIL/uL (ref 3.87–5.11)
RDW: 15.4 % (ref 11.5–15.5)
WBC: 10.4 10*3/uL (ref 4.0–10.5)

## 2017-01-08 LAB — COMPREHENSIVE METABOLIC PANEL
ALT: 44 U/L (ref 14–54)
AST: 94 U/L — ABNORMAL HIGH (ref 15–41)
Albumin: 2.9 g/dL — ABNORMAL LOW (ref 3.5–5.0)
Alkaline Phosphatase: 66 U/L (ref 38–126)
Anion gap: 10 (ref 5–15)
BUN: 14 mg/dL (ref 6–20)
CO2: 25 mmol/L (ref 22–32)
Calcium: 8.3 mg/dL — ABNORMAL LOW (ref 8.9–10.3)
Chloride: 110 mmol/L (ref 101–111)
Creatinine, Ser: 0.89 mg/dL (ref 0.44–1.00)
GFR calc Af Amer: >60 mL/min (ref >60)
GFR calc non Af Amer: >60 mL/min (ref >60)
Glucose, Bld: 210 mg/dL — ABNORMAL HIGH (ref 65–99)
Potassium: 3.6 mmol/L (ref 3.5–5.1)
Sodium: 145 mmol/L (ref 135–145)
Total Bilirubin: 1.2 mg/dL (ref 0.3–1.2)
Total Protein: 6 g/dL — ABNORMAL LOW (ref 6.5–8.1)

## 2017-01-08 LAB — GLUCOSE, CAPILLARY
Glucose-Capillary: 182 mg/dL — ABNORMAL HIGH (ref 65–99)
Glucose-Capillary: 156 mg/dL — ABNORMAL HIGH (ref 65–99)
Glucose-Capillary: 206 mg/dL — ABNORMAL HIGH (ref 65–99)
Glucose-Capillary: 201 mg/dL — ABNORMAL HIGH (ref 65–99)

## 2017-01-08 LAB — CK: Total CK: 2674 U/L — ABNORMAL HIGH (ref 38–234)

## 2017-01-08 MED ORDER — LORAZEPAM 2 MG/ML IJ SOLN
1.0000 mg | INTRAMUSCULAR | Status: DC | PRN
Start: 2017-01-08 — End: 2017-01-09
  Administered 2017-01-08: 1 mg via INTRAVENOUS
  Filled 2017-01-08: qty 1

## 2017-01-08 MED ORDER — OXYCODONE-ACETAMINOPHEN 5-325 MG PO TABS: 1.0000 | ORAL_TABLET | Freq: Four times a day (QID) | ORAL | Status: DC | PRN

## 2017-01-08 MED ORDER — OXYCODONE-ACETAMINOPHEN 5-325 MG PO TABS
1.0000 | ORAL_TABLET | Freq: Four times a day (QID) | ORAL | Status: AC | PRN
  Administered 2017-01-08 – 2017-01-09 (×2): 1 via ORAL
  Filled 2017-01-08 (×2): qty 1

## 2017-01-08 MED ORDER — LOPERAMIDE HCL 2 MG PO CAPS
2.0000 mg | ORAL_CAPSULE | ORAL | Status: DC | PRN
  Administered 2017-01-08 – 2017-01-11 (×9): 2 mg via ORAL
  Filled 2017-01-08 (×9): qty 1

## 2017-01-08 NOTE — Evaluation (Signed)
Clinical/Bedside Swallow Evaluation Patient Details  Name: Jennifer [name] MRN: [id number] Date of Birth: Jan 20, 1961  Today's Date: 01/08/2017 Time: SLP Start Time (ACUTE ONLY): [id number] SLP Stop Time (ACUTE ONLY): 1329 SLP Time Calculation (min) (ACUTE ONLY): 12 min  Past Medical History:  Past Medical History:  Diagnosis Date  . Anginal pain [facility])    '3' of 10 ischemia ruled out 9/9   . 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 Asthma   . CHF (congestive heart failure) ([place])   . 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 Chronic bronchitis ([place])    "I get it alot" (09/28/2013)  . Chronic diastolic heart failure (HCC)    grade 2 per 2D echocardiogram (01/2013)  . Chronic lower back pain   . Chronic osteomyelitis of foot (HCC)    chronic, right secondary to diabetic foot ulcers  . Chronic pain syndrome 12/03/2011   Likely secondary to depression, "fibromyalgia", neuropathy, and obesity. Lumbar MRI 2014 no sig change from prior (2008) : Stable hypertrophic facet disease most notable at L4-5. Stable shallow left foraminal/extraforaminal disc protrusion at L4-5. No direct neural compression.      Marland Kitchen COPD 01/08/2007   PFT's 05/2007 : FEV1/FVC 82, FEV1 64% pred, FEF 25-75% 40% predicted, 16% improvement in FEV1 with bronchodilators.     . Depression   . Diabetic peripheral neuropathy ([place])   . DVT of upper extremity (deep vein thrombosis) ([place]) 03/11/2013   Secondary to PICC line. Right brachial vein, diagnosed on 03/10/2013 Coumadin for 3 months. End date 06/10/2013   . Environmental allergies    Hx: of  . Exertional shortness of breath   . Fatty liver 2003   observed on ultrasound abdomen  . Fibromyalgia   . GERD (gastroesophageal reflux disease)   . Glaucoma   . Hyperlipidemia   . Hyperplastic colon polyp 12/2010   Per colonoscopy (12/2010) - Dr. Deatra Ina  . Hypertension   . 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 Blood  cultures 01/26/13 S. Aureus and GBS. Blood cultures Feb 6th, 8th, and 9th and March were negative.Repeat TEE 3/20 negative for vegitations  . 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 2/2 tobacco use, DM, and cont weight bearing on surgical wound and developed gangrene   . Polymicrobial bacterial infection 01/2013   GBS and S. aureus bacteremia // Source likely infected diabetic foot ulcer  . PVD (peripheral vascular disease) with claudication ([place])    Stents to bilateral common iliac arteries (left 2005, right 2008), on chronic plavix  . Rheumatoid arthritis ([place])   . Tobacco abuse   . Type II diabetes mellitus with peripheral circulatory disorders, uncontrolled DX: 1993   Insulin dep. Poor control. Complicated by diabetic foot ulcer and diabetic eye disease.    Marland Kitchen Ulcer of foot, chronic (HCC)    Left. No OM per MRI (01/2013)   Past Surgical History:  Past Surgical History:  Procedure Laterality Date  . ABDOMINAL HYSTERECTOMY  1997   secondary to uterine fibroids  . AMPUTATION Left 08/31/2013   Procedure: AMPUTATION RAY;  Surgeon: Newt Minion, MD;  Location: [place];  Service: Orthopedics;  Laterality: Left;  Left Foot 5th Ray Amputation  . AMPUTATION Left 09/28/2013   Procedure: Left Midfoot amputation;  Surgeon: Newt Minion, MD;  Location: [place];  Service: Orthopedics;  Laterality: Left;  Left  Midfoot amputation  . AMPUTATION Left 10/14/2013   Procedure: AMPUTATION BELOW KNEE- left;  Surgeon: Newt Minion, MD;  Location: [place];  Service: Orthopedics;  Laterality: Left;  Left Below Knee Amputation   . BLADDER SURGERY     bladder reconstruction surgery  . BREAST BIOPSY     multiple-benign per pt  . ESOPHAGOGASTRODUODENOSCOPY N/A 09/20/2013   Procedure: ESOPHAGOGASTRODUODENOSCOPY (EGD);  Surgeon: Jerene Bears, MD;  Location: [place];  Service: Gastroenterology;  Laterality: N/A;  . FOOT AMPUTATION THROUGH METATARSAL  Left 09/28/2013  . GANGLION CYST EXCISION     multiple  . OTHER SURGICAL HISTORY     stents in lower ext  . SHOULDER ARTHROSCOPY W/ ROTATOR CUFF REPAIR Bilateral   . SKIN SPLIT GRAFT Bilateral 05/13/2013   Procedure: Right and Left Foot Allograft Skin Graft;  Surgeon: Newt Minion, MD;  Location: [place];  Service: Orthopedics;  Laterality: Bilateral;  Right and Left Foot Allograft Skin Graft  . TEE WITHOUT CARDIOVERSION N/A 01/31/2013   Procedure: TRANSESOPHAGEAL ECHOCARDIOGRAM (TEE);  Surgeon: Fay Records, MD;  Location: [place];  Service: Cardiovascular;  Laterality: N/A;  Rm [phone]  . TEE WITHOUT CARDIOVERSION N/A 03/10/2013   Procedure: TRANSESOPHAGEAL ECHOCARDIOGRAM (TEE);  Surgeon: Larey Dresser, MD;  Location: [place];  Service: Cardiovascular;  Laterality: N/A;  Rm. [phone]  . TOE AMPUTATION Left 08/31/2013   4TH & 5 TH TOE   . TONSILLECTOMY    . TUBAL LIGATION    . WRIST SURGERY Right    "for tumors" (09/28/2013)   HPI:  56yo female with PMH of T2DM, MDD, HTN, GERD, BKA, Chronic diastolic CHF, COPD, h/o endocarditis, PVD, presenting to the [facility] after a fall 3 days prior and subsequent change in mental status, vomiting and diarrhea. MRI pending.   Assessment / Plan / Recommendation Clinical Impression  Pt has no overt s/s of aspiration, but her risk is heightened by her impaired sustained attention and impulsivity. She drinks rapidly and attempts to lie down while still masticating solid foods. SLP provided Mod cues for safety awareness and pacing. Recommend to initiate Dys 2 diet and thin liquids with full supervision necessary. Will f/u for tolerance. Pt has good prognosis for diet advancement pending improved mentation.    Aspiration Risk  Mild aspiration risk    Diet Recommendation Dysphagia 2 (Fine chop);Thin liquid   Liquid Administration via: Cup;Straw Medication Administration: Whole meds with puree Supervision: Patient able to self feed;Full supervision/cueing for  compensatory strategies Compensations: Slow rate;Small sips/bites;Minimize environmental distractions Postural Changes: Seated upright at 90 degrees    Other  Recommendations Oral Care Recommendations: Oral care BID   Follow up Recommendations  (tba)      Frequency and Duration min 2x/week  2 weeks       Prognosis Prognosis for Safe Diet Advancement: Good Barriers to Reach Goals: Cognitive deficits      Swallow Study   General HPI: 56yo female with PMH of T2DM, MDD, HTN, GERD, BKA, Chronic diastolic CHF, COPD, h/o endocarditis, PVD, presenting to the [facility] after a fall 3 days prior and subsequent change in mental status, vomiting and diarrhea. MRI pending. Type of Study: Bedside Swallow Evaluation Previous Swallow Assessment: none in chart Diet Prior to this Study: NPO Temperature Spikes Noted: No Respiratory Status: Room air History of Recent Intubation: No Behavior/Cognition: Impulsive;Lethargic/Drowsy;Distractible;Requires cueing Oral Cavity Assessment: Within Functional Limits Oral Care Completed by SLP: No Oral Cavity - Dentition: Adequate natural dentition Vision: Functional for self-feeding Self-Feeding  Abilities: Able to feed self Patient Positioning: Upright in bed Baseline Vocal Quality: Normal Volitional Cough: Weak Volitional Swallow: Able to elicit    Oral/Motor/Sensory Function Overall Oral Motor/Sensory Function: Within functional limits   Ice Chips Ice chips: Not tested   Thin Liquid Thin Liquid: Within functional limits Presentation: Cup;Self Fed;Straw    Nectar Thick Nectar Thick Liquid: Not tested   Honey Thick Honey Thick Liquid: Not tested   Puree Puree: Within functional limits Presentation: Self Fed;Spoon   Solid   GO   Solid: Impaired Presentation: Self Fed Oral Phase Impairments: Impaired mastication;Other (comment) (poor sustained attention)        Jennifer [name] 01/08/2017,2:00 PM  Jennifer [name], M.A.  CCC-SLP [phone]

## 2017-01-08 NOTE — Progress Notes (Signed)
Internal Medicine Attending:   I saw and examined the patient. I reviewed the resident's note and I agree with the resident's findings and plan as documented in the resident's note. Jennifer [name] seems to have made some minor improvements in her mental status overnight.  She is now oriented to person and place, her responses are now longer than yes and no answers and she seems to comprehend questions better.  However we are still very limited in the information provided by her.  Pharmacy was able to get in touch with the outpatient pharmacy that she uses, per their note she does not pick up her "maintance" medications, and only picks up Percocet, they also note that she usually tries to fill this early.  In asking her about her medications she only responds that she takes "all of them" and takes them "all the time."  In asking about her insulin use she reports that she uses insulin, but seems unwilling to elaborate further.  She denies taking any medications or illicit substances not prescribed to her.  In regards to the events prior to her admission she reports that she does not remember anything over the last few days. Her current complaint is "pain all over," she cannot characterize the pain or pinpoint any areas that are particularly painful. Overall her encephalopathy remains somewhat perplexing.  She has evidence of mild rhabdomyolysis that is improving with IVF.  Her diabetes is uncontrolled although suprisingly well controlled inpatient when you consider we are only giving her sliding scale correction.  I suspect a great deal of non compliance with her medical therapy.  With supportive care she appears to be slowly improving.  She may be experience some opoid withdraw, we will institute a low dose of oxycodone back to her as I do not think her presentation is consistent with a narcotic overdose. I would still like to pursue the MRI of her head. In the absence of fever, neck pain, or headache I am less  inclined to do a LP at this time. Again her osteomyelitis is a chronic issue, she has no evidence of an acute worsening,  Per notes she had plans to begin parenteral antibiotics,  We will therefore attempt to contact her wound care doctor and see if they have any culture results to guide choice of antibiotic and likely her PICC line could be placed while she is here.

## 2017-01-08 NOTE — Progress Notes (Signed)
Subjective: No acute events overnight.  Continues to complain of pain all over.  Now can identify that Elberta Fortis is her father and Georgina Snell is her son.  Objective:  Vital signs in last 24 hours: Vitals:   01/08/17 0300 01/08/17 0819 01/08/17 0855 01/08/17 1100  BP: (!) 177/55  129/80 (!) 159/67  Pulse: 75  79 81  Resp: (!) 27   19  Temp: 98.5 F (36.9 C)  97.3 F (36.3 C) 97.6 F (36.4 C)  TempSrc: Oral  Oral Oral  SpO2: 95% 98% 98% 95%   Physical Exam  Constitutional:  Disheveled woman, moderately agitated, restlessly shifting in bed and intermittently sitting up, answering questions with few words  Cardiovascular: Normal rate and regular rhythm.   Pulmonary/Chest: Effort normal and breath sounds normal.  Musculoskeletal:  L BKA R 5th toe black, necrotic appearing, with small dorsolateral wound No exudate  Neurological:  Alert, oriented to person and place Knows name of son and father Follows simple commands Strength grossly intact and symmetric in bilateral UE and LE Full ROM in neck PERRL Normal phonation Tongue midline    CBC Latest Ref Rng & Units 01/08/2017 01/07/2017 01/06/2017  WBC 4.0 - 10.5 K/uL 10.4 11.2(H) 10.9(H)  Hemoglobin 12.0 - 15.0 g/dL 14.6 14.0 14.3  Hematocrit 36.0 - 46.0 % 45.6 42.2 43.8  Platelets 150 - 400 K/uL 149(L) 159 153   BMP Latest Ref Rng & Units 01/08/2017 01/07/2017 01/06/2017  Glucose 65 - 99 mg/dL 210(H) 207(H) 225(H)  BUN 6 - 20 mg/dL 14 13 12   Creatinine 0.44 - 1.00 mg/dL 0.89 0.86 0.84  BUN/Creat Ratio 9 - 23 - - -  Sodium 135 - 145 mmol/L 145 143 141  Potassium 3.5 - 5.1 mmol/L 3.6 3.2(L) 3.3(L)  Chloride 101 - 111 mmol/L 110 103 102  CO2 22 - 32 mmol/L 25 26 26   Calcium 8.9 - 10.3 mg/dL 8.3(L) 9.2 9.1   Component     Latest Ref Rng & Units 01/07/2017 01/08/2017  CK Total     38 - 234 U/L 5,058 (H) 2,674 (H)    Assessment/Plan:  Principal Problem:   Acute encephalopathy Active Problems:   DM (diabetes mellitus) type II  uncontrolled, periph vascular disorder (HCC)   Hypertension associated with diabetes ([place])   Chronic diastolic heart failure (HCC)   S/P BKA (below knee amputation) unilateral (HCC)   Diabetic osteomyelitis (HCC)   Rhabdomyolysis   Non compliance w medication regimen   56 y.o. female with acute encephalopathy of uncertain etiology.  Stroke, given her risk factors, acute change, and association with fall is high on the differential.  No evidence of toxic/metabolic encephalopathy has been identified, including B12 deficiency, hyperammonemia, electrolyte disturbance, and drug/alcohol intoxication.  She does have a diabetic foot wound and osteomyelitis, but does not have signs of systemic infection and septic encephalopathy is unlikely.  Nonetheless, treating with empiric broad spectrum antibiotics.  #Acute Encephalopathy Mental status change sometime in the last 3 days associated with a fall of uncertain circumstances.  Differential includes stroke and toxic/metabolic encephalopathy.  Less likely infectious with no fever, mild leukocytosis, full ROM in neck.  No electrolyte abnormalities, normal ammonia, normal B12, TSH normal, negative blood EtOH level, UDS negative, no gross structural abnormalities on head CT.   -Thiamine -Hold deleriogenic meds (baclofen, percocet, trazodone, varenicline, diphenhydramine, pregabalin, sertraline)  -MRI brain Sales promotion account executive -f/u RPR, heavy metals -Continue trying to contact sons for further collateral -Consider Neurology consult following imaging -Consider LP -Speech  eval  #Osteomyelitis Per Dr Zenovia Jarred notes, plan for management with IV antibiotics per wound care, though she has not had PICC placed.  This seems to be unrelated to her encephalopathy and she is not septic, but will continue empiric antibiotics in case septic encephalopathy is contributing. -Continue empiric vancomycin and cefepime -F/u blood cultures  #Rhabdomyolysis Improving.  Elevated  CK and urine with hemoglobin but no RBCs consistent with mild rhabdomyolysis. -IVF -Follow renal function  #Hypokalemia Resolved -Continue home 40 mEq KCl daily -Follow electrolytes  #DM BG 100s-200s so far here.  Poor compliance with home insulin regimen suspected in the past, uncertain what she has been taking recently. -SSI  #HTN -Continue home amlodipine 10 mg daily, atenolol 100 mg daily -Hold diuretics (HCTZ 25 mg daily, benazepril 40 mg daily, lasix 40 mg daily)  Fluids: NS 100 mL/hr Diet: NPO DVT Prophylaxis: lovenox Code Status: full  Dispo: Anticipated discharge 2-3 days.  Minus Liberty, MD 01/08/2017, 12:18 PM Pager: [phone]

## 2017-01-09 ENCOUNTER — Inpatient Hospital Stay (HOSPITAL_COMMUNITY): Payer: Medicare Other

## 2017-01-09 DIAGNOSIS — E1165 Type 2 diabetes mellitus with hyperglycemia: Secondary | ICD-10-CM

## 2017-01-09 LAB — BASIC METABOLIC PANEL
Anion gap: 6 (ref 5–15)
Anion gap: 5 (ref 5–15)
BUN: 14 mg/dL (ref 6–20)
BUN: 13 mg/dL (ref 6–20)
CO2: 25 mmol/L (ref 22–32)
CO2: 22 mmol/L (ref 22–32)
Calcium: 7.4 mg/dL — ABNORMAL LOW (ref 8.9–10.3)
Calcium: 7.7 mg/dL — ABNORMAL LOW (ref 8.9–10.3)
Chloride: 109 mmol/L (ref 101–111)
Chloride: 113 mmol/L — ABNORMAL HIGH (ref 101–111)
Creatinine, Ser: 0.76 mg/dL (ref 0.44–1.00)
Creatinine, Ser: 0.81 mg/dL (ref 0.44–1.00)
GFR calc Af Amer: >60 mL/min (ref >60)
GFR calc Af Amer: >60 mL/min (ref >60)
GFR calc non Af Amer: >60 mL/min (ref >60)
GFR calc non Af Amer: >60 mL/min (ref >60)
Glucose, Bld: 190 mg/dL — ABNORMAL HIGH (ref 65–99)
Glucose, Bld: 231 mg/dL — ABNORMAL HIGH (ref 65–99)
Potassium: 2.8 mmol/L — ABNORMAL LOW (ref 3.5–5.1)
Potassium: 3.7 mmol/L (ref 3.5–5.1)
Sodium: 140 mmol/L (ref 135–145)
Sodium: 140 mmol/L (ref 135–145)

## 2017-01-09 LAB — GLUCOSE, CAPILLARY
Glucose-Capillary: 190 mg/dL — ABNORMAL HIGH (ref 65–99)
Glucose-Capillary: 193 mg/dL — ABNORMAL HIGH (ref 65–99)
Glucose-Capillary: 226 mg/dL — ABNORMAL HIGH (ref 65–99)
Glucose-Capillary: 316 mg/dL — ABNORMAL HIGH (ref 65–99)
Glucose-Capillary: 173 mg/dL — ABNORMAL HIGH (ref 65–99)
Glucose-Capillary: 241 mg/dL — ABNORMAL HIGH (ref 65–99)

## 2017-01-09 LAB — CBC
HCT: 43.6 % (ref 36.0–46.0)
Hemoglobin: 14.4 g/dL (ref 12.0–15.0)
MCH: 31 pg (ref 26.0–34.0)
MCHC: 33 g/dL (ref 30.0–36.0)
MCV: 94 fL (ref 78.0–100.0)
Platelets: 135 10*3/uL — ABNORMAL LOW (ref 150–400)
RBC: 4.64 MIL/uL (ref 3.87–5.11)
RDW: 15.6 % — ABNORMAL HIGH (ref 11.5–15.5)
WBC: 10.7 10*3/uL — ABNORMAL HIGH (ref 4.0–10.5)

## 2017-01-09 LAB — RPR: RPR Ser Ql: NONREACTIVE

## 2017-01-09 LAB — MAGNESIUM: Magnesium: 1.8 mg/dL (ref 1.7–2.4)

## 2017-01-09 MED ORDER — PREGABALIN 100 MG PO CAPS
200.0000 mg | ORAL_CAPSULE | Freq: Three times a day (TID) | ORAL | Status: DC
  Administered 2017-01-09 – 2017-01-12 (×10): 200 mg via ORAL
  Filled 2017-01-09 (×5): qty 2
  Filled 2017-01-09: qty 4
  Filled 2017-01-09 (×3): qty 2
  Filled 2017-01-09: qty 4

## 2017-01-09 MED ORDER — OXYCODONE-ACETAMINOPHEN 5-325 MG PO TABS: 1.0000 | ORAL_TABLET | ORAL | Status: DC | PRN

## 2017-01-09 MED ORDER — BENAZEPRIL HCL 40 MG PO TABS
40.0000 mg | ORAL_TABLET | Freq: Every day | ORAL | Status: DC
  Administered 2017-01-09 – 2017-01-12 (×4): 40 mg via ORAL
  Filled 2017-01-09 (×3): qty 1
  Filled 2017-01-09: qty 2

## 2017-01-09 MED ORDER — ADULT MULTIVITAMIN W/MINERALS CH
1.0000 | ORAL_TABLET | Freq: Every day | ORAL | Status: DC
  Administered 2017-01-09 – 2017-01-12 (×4): 1 via ORAL
  Filled 2017-01-09 (×4): qty 1

## 2017-01-09 MED ORDER — DOXYCYCLINE HYCLATE 100 MG PO TABS
100.0000 mg | ORAL_TABLET | Freq: Two times a day (BID) | ORAL | Status: DC
  Administered 2017-01-09 – 2017-01-10 (×3): 100 mg via ORAL
  Filled 2017-01-09 (×3): qty 1

## 2017-01-09 MED ORDER — GADOBENATE DIMEGLUMINE 529 MG/ML IV SOLN
20.0000 mL | Freq: Once | INTRAVENOUS | Status: AC
  Administered 2017-01-09: 20 mL via INTRAVENOUS

## 2017-01-09 MED ORDER — HYDROCHLOROTHIAZIDE 25 MG PO TABS
25.0000 mg | ORAL_TABLET | Freq: Every day | ORAL | Status: DC
  Administered 2017-01-09 – 2017-01-12 (×4): 25 mg via ORAL
  Filled 2017-01-09 (×4): qty 1

## 2017-01-09 MED ORDER — SODIUM CHLORIDE 0.9 % IV SOLN
30.0000 meq | Freq: Once | INTRAVENOUS | Status: AC
  Administered 2017-01-09: 30 meq via INTRAVENOUS
  Filled 2017-01-09: qty 15

## 2017-01-09 MED ORDER — POTASSIUM CHLORIDE CRYS ER 20 MEQ PO TBCR
40.0000 meq | EXTENDED_RELEASE_TABLET | Freq: Once | ORAL | Status: AC
  Administered 2017-01-09: 40 meq via ORAL
  Filled 2017-01-09: qty 2

## 2017-01-09 MED ORDER — AMOXICILLIN-POT CLAVULANATE 875-125 MG PO TABS
1.0000 | ORAL_TABLET | Freq: Two times a day (BID) | ORAL | Status: DC
  Administered 2017-01-09 – 2017-01-10 (×3): 1 via ORAL
  Filled 2017-01-09 (×4): qty 1

## 2017-01-09 MED ORDER — OXYCODONE-ACETAMINOPHEN 5-325 MG PO TABS
1.0000 | ORAL_TABLET | Freq: Four times a day (QID) | ORAL | Status: DC | PRN
Start: 2017-01-09 — End: 2017-01-12
  Administered 2017-01-09: 2 via ORAL
  Administered 2017-01-09: 1 via ORAL
  Administered 2017-01-10 – 2017-01-12 (×6): 2 via ORAL
  Filled 2017-01-09 (×8): qty 2

## 2017-01-09 NOTE — Progress Notes (Signed)
Speech Language Pathology Treatment: Dysphagia  Patient Details Name: Jennifer [name] MRN: [id number] DOB: 05-25-1961 Today's Date: 01/09/2017 Time: 8358-4465 SLP Time Calculation (min) (ACUTE ONLY): 10 min  Assessment / Plan / Recommendation Clinical Impression  F/u after yesterday's swallow assessment.  Pt with improved MS although not at baseline.  Reports being very tired and needing to sleep; consumed thin liquids with no s/s of aspiration, solids with adequate mastication, swift swallow initiation. Breath sounds clear, diminished at bases.  Swallow appears to be functional  No further SLP f/u needed - will advance diet to soft mechanical given absence of dentition.   HPI HPI: 56yo female with PMH of T2DM, MDD, HTN, GERD, BKA, Chronic diastolic CHF, COPD, h/o endocarditis, PVD, presenting to the [facility] after a fall 3 days prior and subsequent change in mental status, vomiting and diarrhea. MRI pending.      SLP Plan  All goals met     Recommendations  Diet recommendations: Dysphagia 3 (mechanical soft);Thin liquid Liquids provided via: Cup;Straw Medication Administration: Whole meds with puree Supervision: Patient able to self feed Postural Changes and/or Swallow Maneuvers: Seated upright 90 degrees                Oral Care Recommendations: Oral care BID Plan: All goals met       GO                Jennifer [name] 01/09/2017, 11:40 AM

## 2017-01-09 NOTE — Consult Note (Signed)
   [facility] CM Inpatient Consult   01/09/2017  AMEINA NEPPER [birth year] [id number]    [facility] Care Management follow up. Went to bedside to speak with patient. However, she was off unit for MRI per nursing. Will follow up at later time. Ms. Fernholz has been followed by [place] Management program. Please see extensive patient outreach details from [facility] team under chart review tab then notes.    Marthenia Rolling, MSN-Ed, RN,BSN [facility] Liaison [phone]

## 2017-01-09 NOTE — Progress Notes (Signed)
Internal Medicine Attending:   I saw and examined the patient. I reviewed the resident's note and I agree with the resident's findings and plan as documented in the resident's note. Patient is now AAOx4, she is preservating on receiving her lyrica and complains of LE neuropathy.  She does not provide much additional history of the events leading up to her hospitalization.  She reports she takes all of her medications and only as directed. She feels that this was caused by the contrast she was given for her MRI, as she has an allergy to contast, we informed her that her allergy is to a different contrast (Iohexol) and that the MRI was on 1/5 and she was doing well on 1/11 and per family she was confused on 1/14.  Otherwise today her neuro exam is non focal.  I suspect her acute encephalopathy has to do with polypharmacy/ poor medication compliance and low health literacy as she essentially got better with observation off medications.  Nevertheless she does have a lot of risk factors for CVA and I would like to evaluate her with an MRI prior to discharge.

## 2017-01-09 NOTE — Progress Notes (Addendum)
Inpatient Diabetes Program Recommendations  AACE/ADA: New Consensus Statement on Inpatient Glycemic Control (2015)  Target Ranges:  Prepandial:   less than 140 mg/dL      Peak postprandial:   less than 180 mg/dL (1-2 hours)      Critically ill patients:  140 - 180 mg/dL   Lab Results  Component Value Date   GLUCAP 226 (H) 01/09/2017   HGBA1C 8.5 (H) 11/07/2016   Results for Jennifer [name], Jennifer [name] (MRN [id number]) as of 01/09/2017 15:38  Ref. Range 01/08/2017 08:21 01/08/2017 12:39 01/08/2017 16:15 01/08/2017 21:35 01/09/2017 08:27  Glucose-Capillary Latest Ref Range: 65 - 99 mg/dL 206 (H) 201 (H) 190 (H) 193 (H) 226 (H)   Review of Glycemic Control  Diabetes history: DM2 with complications, Obesity Outpatient Diabetes medications: not taking Current orders for Inpatient glycemic control: Novolog 0-15 units [facility]  Inpatient Diabetes Program Recommendations:    Please consider Lantus 25 units daily (0.2 units per Kg).     Thank you,  Windy Carina, RN, MSN Diabetes Coordinator Inpatient Diabetes Program [phone] (Team Pager)

## 2017-01-09 NOTE — Telephone Encounter (Signed)
Pt now admitted

## 2017-01-09 NOTE — Progress Notes (Signed)
Initial Nutrition Assessment  DOCUMENTATION CODES:   Morbid obesity  INTERVENTION:   -MVI daily  NUTRITION DIAGNOSIS:   Increased nutrient needs related to wound healing as evidenced by estimated needs.  GOAL:   Patient will meet greater than or equal to 90% of their needs  MONITOR:   PO intake, Supplement acceptance, Labs, Weight trends, Skin, I & O's  REASON FOR ASSESSMENT:   Malnutrition Screening Tool    ASSESSMENT:   Jennifer [name] is a 56 year old female with history of uncontrolled T2DM, MDD, HTN, GERD, Left BKA due to diabetic foot ulcer, dCHF, COPD, PVD and recently diagnosed Osteomyelitis of her 5th toe on the right.  She was brought into the ED via ambulance yesterday by family due to change in mental status.   Pt admitted with acute encephalopathy.   Pt being followed by SLP, currently on a dysphagia 2 diet.   Spoke with pt at bedside, who reports that her appetite is good. She typically consumes 2 meals per day (Breakfast: grits, eggs, and toast, Dinner: meat, starch, and vegetable). Pt reports she is eating well; meal completion 80%. However, pt is upset with current diet order- discussed rationale for diet order, which pt reluctantly accepted.   Pt denies any weight loss.   Pt shares that her blood sugars ar every poorly controlled. She reports home CBGS between 300-500. She is compliant with her DM medications. When asked about barriers to optimal DM control, pt reports "Me". She states she is trying to cut back on soda and potato chips, however, pt admitted earlier in interview that she still drank regular sprite and was not interested in other alternatives. Provided pt with encouragement and support to optimize glycemic control to prevent further complications.  Pt with rt 5th toe DM ulcer, for which she is followed at the wound center.   Nutrition-Focused physical exam completed. Findings are no fat depletion, no muscle depletion, and no edema.   Labs  reviewed: CBGS: 193-226.   Diet Order:  Diet heart healthy/carb modified Room service appropriate? Yes; Fluid consistency: Thin  Skin:  Wound (see comment) (rt 5th toe DM ulcer)  Last BM:  01/08/17  Height:   Ht Readings from Last 1 Encounters:  01/09/17 5\' 7"  (1.702 m)    Weight:   Wt Readings from Last 1 Encounters:  01/09/17 279 lb (126.6 kg)    Ideal Body Weight:  57.4 kg  BMI:  Body mass index is 43.7 kg/m.  Estimated Nutritional Needs:   Kcal:  1500-1700  Protein:  75-90 grams  Fluid:  >1.5 L  EDUCATION NEEDS:   Education needs addressed  Kynedi Profitt A. Jimmye Norman, RD, LDN, CDE Pager: [phone] After hours Pager: [phone]

## 2017-01-09 NOTE — Care Management Important Message (Addendum)
Important Message  Patient Details  Name: Jennifer [name] MRN: [id number] Date of Birth: [birth year]   Medicare Important Message Given:  Yes   Pt is followed by thn rn and Education officer, museum. They are aware pt in hospital.    Lacretia Leigh, RN 01/09/2017, 9:12 AM

## 2017-01-09 NOTE — Progress Notes (Signed)
Patients son at bedside and speaking with MD over the phone. During conversation the son was verbally abusive with MD and very upset when asked if his mother did drugs, drink alcohol or take more than her medications than prescribed. After this conversation son was very agitated, stating, "what kind of hospital is this and left the unit shouting at the  secretary". Charge nurse notified of incident. Will continue to monitor closely.

## 2017-01-09 NOTE — Progress Notes (Signed)
K+ of 2.8, notified Dr. Jari Favre.

## 2017-01-09 NOTE — Progress Notes (Addendum)
Subjective: Alert and oriented this morning.  Remembers falling while getting off the toilet on Sunday.  Not sure why she became confused, reports no med changes or other symptoms.  Demanding Lyrica for her neuropathy.   ADDENDUM 1713 Spoke on phone with patient son Georgina Snell who lives with her and had been unavailable for several days.  He reported that in the early AM on Sunday, Ms Flocco fell while trying to go to the bathroom.  Her other son found her with "diarrhea feces everywhere", and she was very confused and "loopy" ever since then.  Prior to that, he could not recall any abnormalities.  When asked if she takes her medicines regularly, drinks alcohol, or uses any drugs, he vehemently denied such, became irate, started yelling, and became verbally abusive.  I ensured him that I was trying to be thorough and investigate all potential causes of his mother's confusion, but he continued to shout and become combative.  At that point, I ended the phone call.  Objective:  Vital signs in last 24 hours: Vitals:   01/08/17 1900 01/08/17 2012 01/08/17 2333 01/09/17 0409  BP: (!) 166/77  (!) 179/69 (!) 170/67  Pulse:      Resp: 17  19 12   Temp: 98.4 F (36.9 C)  98.3 F (36.8 C) 97.6 F (36.4 C)  TempSrc: Oral  Oral Oral  SpO2: 97% 95% 96% 96%   Physical Exam  Constitutional:  Sitting on edge of bed eating breakfast, alert, asking for her Lyrica for nerve pain.  Cardiovascular: Normal rate and regular rhythm.   Pulmonary/Chest: Effort normal and breath sounds normal.  Musculoskeletal:  L BKA R 5th toe black, necrotic appearing, with small dorsolateral wound  Neurological:  Alerted and oriented to person, place, time, and situation L exotropia Cranial nerves grossly intact Strength and sensation grossly intact throughout    CBC Latest Ref Rng & Units 01/09/2017 01/08/2017 01/07/2017  WBC 4.0 - 10.5 K/uL 10.7(H) 10.4 11.2(H)  Hemoglobin 12.0 - 15.0 g/dL 14.4 14.6 14.0  Hematocrit 36.0  - 46.0 % 43.6 45.6 42.2  Platelets 150 - 400 K/uL 135(L) 149(L) 159   BMP Latest Ref Rng & Units 01/09/2017 01/08/2017 01/07/2017  Glucose 65 - 99 mg/dL 190(H) 210(H) 207(H)  BUN 6 - 20 mg/dL 14 14 13   Creatinine 0.44 - 1.00 mg/dL 0.76 0.89 0.86  BUN/Creat Ratio 9 - 23 - - -  Sodium 135 - 145 mmol/L 140 145 143  Potassium 3.5 - 5.1 mmol/L 2.8(L) 3.6 3.2(L)  Chloride 101 - 111 mmol/L 109 110 103  CO2 22 - 32 mmol/L 25 25 26   Calcium 8.9 - 10.3 mg/dL 7.4(L) 8.3(L) 9.2  Mg 1.8  Assessment/Plan:  Principal Problem:   Acute encephalopathy Active Problems:   DM (diabetes mellitus) type II uncontrolled, periph vascular disorder (HCC)   Hypertension associated with diabetes (HCC)   Chronic diastolic heart failure (HCC)   S/P BKA (below knee amputation) unilateral (HCC)   Diabetic osteomyelitis (HCC)   Rhabdomyolysis   Non compliance w medication regimen   56 y.o. female with acute encephalopathy of uncertain etiology which has now resolved overnight on HD2 after holding sedating medicaitons.   Stroke, given her risk factors, acute change, and association with fall is high on the differential.  No evidence of toxic/metabolic encephalopathy has been identified, including B12 deficiency, hyperammonemia, electrolyte disturbance, and drug/alcohol intoxication.  She does have a diabetic foot wound and osteomyelitis, but does not have signs of systemic infection and  septic encephalopathy is unlikely.    #Acute Encephalopathy Resolved.  Mental status change sometime in the last 3 days associated with a fall of uncertain circumstances.  Differential includes stroke and toxic/metabolic encephalopathy.  Less likely infectious with no fever, mild leukocytosis, full ROM in neck.  No electrolyte abnormalities, normal ammonia, normal B12, TSH normal, negative blood EtOH level, UDS negative, no gross structural abnormalities on head CT.  Resolution of encephalopathy suggestive of polypharmacy or other  toxic/metabolic etiology, but with her risk factors of DM and HTN will still pursue brain imaging. -Thiamine -Hold deleriogenic meds (baclofen, percocet, trazodone, varenicline, diphenhydramine, sertraline) -Resume pregabalin per pt request -MRI brain -f/u RPR, heavy metals  #Osteomyelitis Per Dr Zenovia Jarred notes, plan for management with IV antibiotics per wound care, though she has not had PICC placed.  This seems to be unrelated to her encephalopathy and she is not septic.  Discussed osteomyelitis with wound care physician, has been referred to podiatry for possible amputation, does not need IV abx.  Bone biopsy confirmed osteo, culture negative, recommended suppressive augmentin and doxycycline. -D/c vancomycin and cefepime -Augmentin and doxycycline -F/u blood cultures -Contact Wound Clinic for treatment plan  #Hypokalemia K 2.8 this morning. -30 mEQ IV KCL -Repeat BMP -Continue home 40 mEq KCl daily -Follow electrolytes  #HTN -Continue home amlodipine 10 mg daily, atenolol 100 mg daily -Resume home benazepril 40 mg daily and HCTZ 25 mg daily -Hold lasix 40 mg daily  #DM BG 100s-200s so far here.  Poor compliance with home insulin regimen suspected in the past, uncertain what she has been taking recently. -SSI  #Rhabdomyolysis Resolved.  Elevated CK and urine with hemoglobin but no RBCs consistent with mild rhabdomyolysis.  Renal function at baseline. -IVF -Follow renal function  Fluids: NS 100 mL/hr Diet: heart/carb DVT Prophylaxis: lovenox Code Status: full  Dispo: Anticipated discharge 1-2 days.  Minus Liberty, MD 01/09/2017, 6:21 AM Pager: [phone]

## 2017-01-10 LAB — URINALYSIS, ROUTINE W REFLEX MICROSCOPIC
Bilirubin Urine: NEGATIVE
Glucose, UA: >=500 mg/dL — AB
Ketones, ur: 5 mg/dL — AB
Nitrite: NEGATIVE
Protein, ur: 100 mg/dL — AB
Specific Gravity, Urine: 1.023 (ref 1.005–1.030)
pH: 5 (ref 5.0–8.0)

## 2017-01-10 LAB — BASIC METABOLIC PANEL
Anion gap: 7 (ref 5–15)
BUN: 13 mg/dL (ref 6–20)
CO2: 22 mmol/L (ref 22–32)
Calcium: 7.9 mg/dL — ABNORMAL LOW (ref 8.9–10.3)
Chloride: 109 mmol/L (ref 101–111)
Creatinine, Ser: 0.94 mg/dL (ref 0.44–1.00)
GFR calc Af Amer: >60 mL/min (ref >60)
GFR calc non Af Amer: >60 mL/min (ref >60)
Glucose, Bld: 220 mg/dL — ABNORMAL HIGH (ref 65–99)
Potassium: 3.7 mmol/L (ref 3.5–5.1)
Sodium: 138 mmol/L (ref 135–145)

## 2017-01-10 LAB — GLUCOSE, CAPILLARY
Glucose-Capillary: 243 mg/dL — ABNORMAL HIGH (ref 65–99)
Glucose-Capillary: 327 mg/dL — ABNORMAL HIGH (ref 65–99)
Glucose-Capillary: 228 mg/dL — ABNORMAL HIGH (ref 65–99)
Glucose-Capillary: 215 mg/dL — ABNORMAL HIGH (ref 65–99)
Glucose-Capillary: 237 mg/dL — ABNORMAL HIGH (ref 65–99)

## 2017-01-10 MED ORDER — INSULIN ASPART 100 UNIT/ML ~~LOC~~ SOLN
0.0000 [IU] | Freq: Three times a day (TID) | SUBCUTANEOUS | Status: DC
Start: 2017-01-10 — End: 2017-01-12
  Administered 2017-01-10: 7 [IU] via SUBCUTANEOUS
  Administered 2017-01-10 – 2017-01-11 (×2): 15 [IU] via SUBCUTANEOUS
  Administered 2017-01-11 (×2): 7 [IU] via SUBCUTANEOUS
  Administered 2017-01-12: 15 [IU] via SUBCUTANEOUS
  Administered 2017-01-12: 11 [IU] via SUBCUTANEOUS

## 2017-01-10 MED ORDER — INSULIN ASPART 100 UNIT/ML ~~LOC~~ SOLN
0.0000 [IU] | Freq: Every day | SUBCUTANEOUS | Status: DC
  Administered 2017-01-10: 2 [IU] via SUBCUTANEOUS

## 2017-01-10 NOTE — Progress Notes (Signed)
Attempted report, nurse unavailable. Will continue to monitor

## 2017-01-10 NOTE — Progress Notes (Signed)
Noted minimal amount of blood on tissue paper after patient urinated. Pt also c/o burning on urination. Informed MD.

## 2017-01-10 NOTE — Progress Notes (Signed)
Attempted to give report for the 3rd time. Nurse unavailable. Report given to night shift RN, Ann on 4N. Communicated to NT that she is to transfer after report is given. Will continue to monitor.

## 2017-01-10 NOTE — Progress Notes (Addendum)
Subjective: Patient seen and examined this morning.  She is alert and oriented.  Only complaint right now is multiple bowel movements.  RN confirms multiple bowel movements noted overnight.  Seems to have been relieved somewhat with Imodium.  She remains upset regarding questioning into her acute confusion that has since resolved.  Objective:  Vital signs in last 24 hours: Vitals:   01/09/17 1659 01/09/17 2000 01/09/17 2124 01/10/17 0646  BP:   (!) 144/53 (!) 153/56  Pulse:   81 (!) 52  Resp:   18 18  Temp:  98.1 F (36.7 C) 98.1 F (36.7 C) 98.3 F (36.8 C)  TempSrc:  Oral Oral   SpO2:   99% 90%  Weight: 279 lb (126.6 kg)     Height: 5\' 7"  (1.702 m)      Physical Exam  Constitutional: She is oriented to person, place, and time.  Lying in bed, in no distress, answering questions, alert and oriented.  Cardiovascular: Normal rate and regular rhythm.   Pulmonary/Chest: Effort normal.  Musculoskeletal:  L BKA R 5th toe black, necrotic appearing, with small dorsolateral wound  Neurological: She is alert and oriented to person, place, and time. No cranial nerve deficit.    CBC Latest Ref Rng & Units 01/09/2017 01/08/2017 01/07/2017  WBC 4.0 - 10.5 K/uL 10.7(H) 10.4 11.2(H)  Hemoglobin 12.0 - 15.0 g/dL 14.4 14.6 14.0  Hematocrit 36.0 - 46.0 % 43.6 45.6 42.2  Platelets 150 - 400 K/uL 135(L) 149(L) 159   BMP Latest Ref Rng & Units 01/10/2017 01/09/2017 01/09/2017  Glucose 65 - 99 mg/dL 220(H) 231(H) 190(H)  BUN 6 - 20 mg/dL 13 13 14   Creatinine 0.44 - 1.00 mg/dL 0.94 0.81 0.76  BUN/Creat Ratio 9 - 23 - - -  Sodium 135 - 145 mmol/L 138 140 140  Potassium 3.5 - 5.1 mmol/L 3.7 3.7 2.8(L)  Chloride 101 - 111 mmol/L 109 113(H) 109  CO2 22 - 32 mmol/L 22 22 25   Calcium 8.9 - 10.3 mg/dL 7.9(L) 7.7(L) 7.4(L)  Mg 1.8  Assessment/Plan:  Principal Problem:   Acute encephalopathy Active Problems:   DM (diabetes mellitus) type II uncontrolled, periph vascular disorder (HCC)  Hypertension associated with diabetes (HCC)   Chronic diastolic heart failure (HCC)   S/P BKA (below knee amputation) unilateral (HCC)   Diabetic osteomyelitis (HCC)   Rhabdomyolysis   Non compliance w medication regimen   56 y.o. female with acute encephalopathy of uncertain etiology which has now resolved overnight on HD2 after holding sedating medicaitons.   Stroke, given her risk factors, acute change, and association with fall is high on the differential.  No evidence of toxic/metabolic encephalopathy has been identified, including B12 deficiency, hyperammonemia, electrolyte disturbance, and drug/alcohol intoxication.  She does have a diabetic foot wound and osteomyelitis, but does not have signs of systemic infection and septic encephalopathy is unlikely.    #Acute Encephalopathy Resolved.  Mental status change sometime in the last 3 days associated with a fall of uncertain circumstances while going to the restroom.  Differential includes stroke and toxic/metabolic encephalopathy.  Less likely infectious with no fever, mild leukocytosis, full ROM in neck.  No electrolyte abnormalities, normal ammonia, normal B12, TSH normal, negative blood EtOH level, UDS negative, no gross structural abnormalities on head CT.  Resolution of encephalopathy suggestive of polypharmacy as her symptoms resolved essentially with observation off medications. -Hold deleriogenic meds (baclofen, trazodone, varenicline, diphenhydramine, sertraline) -Resume pregabalin and percocet per pt request -MRI brain with no acute  abnormalities.  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 -RPR negative -f/u heavy metals  #Osteomyelitis Per Dr Zenovia Jarred notes, plan for management with IV antibiotics per wound care, though she has not had PICC placed.  This seems to be unrelated to her encephalopathy and she is not septic.  Discussed osteomyelitis with wound  care physician on 1/19, has been referred to podiatry for possible amputation, does not need IV abx.  Bone biopsy confirmed osteo, culture negative, recommended suppressive augmentin and doxycycline.  Discussed this with patient who provided confirmation that she would likely need amputation and had been on oral antibiotics. -D/c vancomycin and cefepime -Augmentin and doxycycline -F/u blood cultures -F/u with wound care center and podiatry at discharge  #Diarrhea Onset appears related to starting Augmentin and Doxycycline as an outpatient.  No allergy or hypersensitivity noted.  C. Diff was negative on 01/07/2017 so do not suspect this playing a role right now.  Likely antibiotic related.  Nursing reports that Imodium seems to help.  Was given Imodium once on 1/19 and once this morning. - Imodium after each BM with titration to 2-3 BM per day - Low threshold to repeat C Diff in symptoms progress - May need to consider other antibiotic choice than Augmentin  -ADDENDUM 12:47pm: called patient pharmacy ([place]) to inquire about recent antibiotic history as patient is unable to accurately detail which, if any, antibiotics she recently was taking.  Pharmacy reports patient picked up Augmentin and Doxycycline on 1/12 and was prescribed a 7-day course by Dr. Dellia Nims [facility]).  Patient history of diarrhea seems to correlate with starting these medications as best I can tell.  Since admission, she also received Vanc and Cefepime prior to de-escalating back to Augmentin and Doxycycline.  Will plan to d/c Augmentin and Doxycycline at this time, watch for progression of diarrhea and titrate her Imodium to 2-3 BM per day.  #Hypokalemia 3.7 this morning after replacement. -Morning BMP -Continue home 40 mEq KCl daily -Follow electrolytes  #HTN -Continue home amlodipine 10 mg daily, atenolol 100 mg daily, benazepril 40 mg daily and HCTZ 25 mg daily -Hold lasix 40 mg daily  #DM BG more  elevated last 24 hours with resuming more regular diet.  Poor compliance with home insulin regimen suspected in the past, uncertain what she has been taking recently.  Home meds reported as Humalog 75/25 140 units QAM and 85 units QPM, reported as not taking on 01/07/2017 -increase to SSI - resistant with HS coverage.  #Rhabdomyolysis Resolved.  Elevated CK and urine with hemoglobin but no RBCs consistent with mild rhabdomyolysis.  Renal function at baseline. -Follow renal function  Fluids: None Diet: heart/carb DVT Prophylaxis: lovenox Code Status: full  Dispo: Anticipated discharge 0-1 days.  Jule Ser, DO 01/10/2017, 8:52 AM Pager: [phone]

## 2017-01-10 NOTE — Progress Notes (Signed)
Attempted to give report x2 . Nurse unavailable.

## 2017-01-10 NOTE — Progress Notes (Signed)
  Date: 01/10/2017  Patient name: Jennifer [name]  Medical record number: [id number]  Date of birth: [birth year]   This patient's plan of care was discussed with the house staff. Please see their note for complete details. I concur with their findings.   Sid Falcon, MD 01/10/2017, 5:09 PM

## 2017-01-11 DIAGNOSIS — E1159 Type 2 diabetes mellitus with other circulatory complications: Secondary | ICD-10-CM

## 2017-01-11 DIAGNOSIS — I10 Essential (primary) hypertension: Secondary | ICD-10-CM

## 2017-01-11 DIAGNOSIS — R300 Dysuria: Secondary | ICD-10-CM

## 2017-01-11 DIAGNOSIS — I5032 Chronic diastolic (congestive) heart failure: Secondary | ICD-10-CM

## 2017-01-11 DIAGNOSIS — E1169 Type 2 diabetes mellitus with other specified complication: Secondary | ICD-10-CM

## 2017-01-11 DIAGNOSIS — M9080 Osteopathy in diseases classified elsewhere, unspecified site: Secondary | ICD-10-CM

## 2017-01-11 DIAGNOSIS — M869 Osteomyelitis, unspecified: Secondary | ICD-10-CM

## 2017-01-11 LAB — BASIC METABOLIC PANEL
Anion gap: 8 (ref 5–15)
BUN: 18 mg/dL (ref 6–20)
CO2: 22 mmol/L (ref 22–32)
Calcium: 8.3 mg/dL — ABNORMAL LOW (ref 8.9–10.3)
Chloride: 106 mmol/L (ref 101–111)
Creatinine, Ser: 0.99 mg/dL (ref 0.44–1.00)
GFR calc Af Amer: >60 mL/min (ref >60)
GFR calc non Af Amer: >60 mL/min (ref >60)
Glucose, Bld: 312 mg/dL — ABNORMAL HIGH (ref 65–99)
Potassium: 4.3 mmol/L (ref 3.5–5.1)
Sodium: 136 mmol/L (ref 135–145)

## 2017-01-11 LAB — GLUCOSE, CAPILLARY
Glucose-Capillary: 316 mg/dL — ABNORMAL HIGH (ref 65–99)
Glucose-Capillary: 238 mg/dL — ABNORMAL HIGH (ref 65–99)
Glucose-Capillary: 209 mg/dL — ABNORMAL HIGH (ref 65–99)
Glucose-Capillary: 159 mg/dL — ABNORMAL HIGH (ref 65–99)

## 2017-01-11 LAB — CULTURE, BLOOD (ROUTINE X 2)
Culture: NO GROWTH
Culture: NO GROWTH

## 2017-01-11 MED ORDER — LORATADINE 10 MG PO TABS
10.0000 mg | ORAL_TABLET | Freq: Once | ORAL | Status: AC
  Administered 2017-01-11: 10 mg via ORAL
  Filled 2017-01-11: qty 1

## 2017-01-11 MED ORDER — INSULIN ASPART 100 UNIT/ML ~~LOC~~ SOLN
6.0000 [IU] | Freq: Three times a day (TID) | SUBCUTANEOUS | Status: DC
  Administered 2017-01-11 – 2017-01-12 (×4): 6 [IU] via SUBCUTANEOUS

## 2017-01-11 NOTE — Progress Notes (Signed)
Subjective: Patient seen and examined this morning.  She is alert and oriented.  She complains of burning with urination, bleeding from her vagina, and continued diarrhea.  She remains upset about line of questioning related to her encephalopathy.  Objective:  Vital signs in last 24 hours: Vitals:   01/10/17 1415 01/10/17 2125 01/11/17 0458 01/11/17 0920  BP: 124/70 (!) 127/54 140/63   Pulse: 64 62 63   Resp: 20 19 19    Temp: 97.9 F (36.6 C) 97.9 F (36.6 C) 98.6 F (37 C)   TempSrc:  Oral Oral   SpO2: 96% 93% 100% 91%  Weight:      Height:       Physical Exam  Constitutional:  Sitting up in chair by the window, eating breakfast, not in distress.  Eyes: Conjunctivae and EOM are normal.  Neck: Normal range of motion.  Pulmonary/Chest: Effort normal.  Musculoskeletal:  L BKA  Neurological: She is alert. No cranial nerve deficit.  Psychiatric:  She displays splitting personality.    CBC Latest Ref Rng & Units 01/09/2017 01/08/2017 01/07/2017  WBC 4.0 - 10.5 K/uL 10.7(H) 10.4 11.2(H)  Hemoglobin 12.0 - 15.0 g/dL 14.4 14.6 14.0  Hematocrit 36.0 - 46.0 % 43.6 45.6 42.2  Platelets 150 - 400 K/uL 135(L) 149(L) 159   BMP Latest Ref Rng & Units 01/11/2017 01/10/2017 01/09/2017  Glucose 65 - 99 mg/dL 312(H) 220(H) 231(H)  BUN 6 - 20 mg/dL 18 13 13   Creatinine 0.44 - 1.00 mg/dL 0.99 0.94 0.81  BUN/Creat Ratio 9 - 23 - - -  Sodium 135 - 145 mmol/L 136 138 140  Potassium 3.5 - 5.1 mmol/L 4.3 3.7 3.7  Chloride 101 - 111 mmol/L 106 109 113(H)  CO2 22 - 32 mmol/L 22 22 22   Calcium 8.9 - 10.3 mg/dL 8.3(L) 7.9(L) 7.7(L)  Mg 1.8  Assessment/Plan:  Principal Problem:   Acute encephalopathy Active Problems:   DM (diabetes mellitus) type II uncontrolled, periph vascular disorder (HCC)   Hypertension associated with diabetes (HCC)   Chronic diastolic heart failure (HCC)   S/P BKA (below knee amputation) unilateral (HCC)   Diabetic osteomyelitis (HCC)   Rhabdomyolysis   Non  compliance w medication regimen   Dysuria   56 y.o. female with acute encephalopathy of uncertain etiology which has now resolved overnight on HD2 after holding sedating medicaitons.   Stroke, given her risk factors, acute change, and association with fall is high on the differential.  No evidence of toxic/metabolic encephalopathy has been identified, including B12 deficiency, hyperammonemia, electrolyte disturbance, and drug/alcohol intoxication.  She does have a diabetic foot wound and osteomyelitis, but does not have signs of systemic infection and septic encephalopathy is unlikely.    #Acute Encephalopathy Thorough work-up has not identified any identifying etiology. Resolution of encephalopathy suggestive of polypharmacy as her symptoms resolved essentially with observation off medications. MRI brain with no acute abnormalities.  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 -Hold deleriogenic meds (baclofen, trazodone, varenicline, diphenhydramine, sertraline) -Resumed pregabalin and percocet per pt request -f/u heavy metals  #Osteomyelitis Per Dr Zenovia Jarred notes, plan for management with IV antibiotics per wound care, though she has not had PICC placed. Discussed osteomyelitis with wound care physician on 1/19, has been referred to podiatry for possible amputation, does not need IV abx.  Bone biopsy confirmed osteo, culture negative, recommended suppressive augmentin and doxycycline for 7 day course which was filled at pharmacy on 1/12.  Discussed  this with patient who provided confirmation that she would likely need amputation.  She reports missing her podiatry appointment due to hospitalization and will need rescheduling at discharge. -F/u blood cultures -F/u with wound care center and podiatry at discharge  #Diarrhea Onset appears related to starting Augmentin and Doxycycline as an outpatient.  No allergy or  hypersensitivity noted.  C. Diff was negative on 01/07/2017 so do not suspect this playing a role right now.  Likely antibiotic related.  3 stool occurrences documented in chart, patient reports more than this. - Imodium after each BM with titration to 2-3 BM per day - Low threshold to repeat C Diff if symptoms progress - I called patient pharmacy ([place]) on 1/20 to inquire about recent antibiotic history as patient is unable to accurately detail which, if any, antibiotics she recently was taking.  Pharmacy reports patient picked up Augmentin and Doxycycline on 1/12 and was prescribed a 7-day course by Dr. Dellia Nims [facility]).  Patient history of diarrhea seems to correlate with starting these medications as best I can tell.  Since admission, she also received Vanc and Cefepime prior to de-escalating back to Augmentin and Doxycycline.    #Dysuria  #Vaginal Bleeding Reports burning when she urinates and bleeding from her vagina.  UA yesterday may be indicative of infection but may also be due to the extensive glucosuria present as well.  Nonetheless, she has been on antibiotics since admission and so UTI seems less likely.  No obvious bleeding other than nursing report of minimal blood on tissue paper after urination. - obtain urine culture prior to treating any potential infection - glycemic control - consider outpatient Ob/Gyn referral, will discuss with PCP tomorrow regarding this versus inpatient work-up - morning CBC  #Hypokalemia Stable -Continue home 40 mEq KCl daily -Follow electrolytes  #HTN -Continue home amlodipine 10 mg daily, atenolol 100 mg daily, benazepril 40 mg daily and HCTZ 25 mg daily -Hold lasix 40 mg daily  #DM BG continue to be elevated last 24 hours despite escalating her insulin.   Poor compliance with home insulin regimen suspected in the past, uncertain what she has been taking recently.  Home meds reported as Humalog 75/25 140 units QAM and 85 units  QPM, reported as not taking on 01/07/2017 -increased to SSI - resistant with HS coverage on 1/20 - will add 6 units meal time coverage  Fluids: None Diet: heart/carb DVT Prophylaxis: lovenox Code Status: full  Dispo: Anticipated discharge 0-1 days.  Jule Ser, DO 01/11/2017, 10:05 AM Pager: [phone]

## 2017-01-11 NOTE — Progress Notes (Signed)
  Date: 01/11/2017  Patient name: Jennifer [name]  Medical record number: [id number]  Date of birth: 02/03/1961   I have seen and evaluated this patient and I have discussed the plan of care with the house staff. Please see Dr. Alcario Drought note for complete details. I concur with his findings.  Sid Falcon, MD 01/11/2017, 12:18 PM

## 2017-01-12 ENCOUNTER — Telehealth: Payer: Self-pay | Admitting: Internal Medicine

## 2017-01-12 ENCOUNTER — Ambulatory Visit: Payer: Self-pay

## 2017-01-12 ENCOUNTER — Ambulatory Visit: Payer: Medicare Other

## 2017-01-12 ENCOUNTER — Encounter (HOSPITAL_COMMUNITY): Payer: Self-pay | Admitting: General Practice

## 2017-01-12 DIAGNOSIS — Z9114 Patient's other noncompliance with medication regimen: Secondary | ICD-10-CM

## 2017-01-12 DIAGNOSIS — R197 Diarrhea, unspecified: Secondary | ICD-10-CM

## 2017-01-12 DIAGNOSIS — R3129 Other microscopic hematuria: Secondary | ICD-10-CM

## 2017-01-12 DIAGNOSIS — R8299 Other abnormal findings in urine: Secondary | ICD-10-CM

## 2017-01-12 LAB — GLUCOSE, CAPILLARY
Glucose-Capillary: 265 mg/dL — ABNORMAL HIGH (ref 65–99)
Glucose-Capillary: 336 mg/dL — ABNORMAL HIGH (ref 65–99)

## 2017-01-12 LAB — CBC
HCT: 42.6 % (ref 36.0–46.0)
Hemoglobin: 13.4 g/dL (ref 12.0–15.0)
MCH: 30.5 pg (ref 26.0–34.0)
MCHC: 31.5 g/dL (ref 30.0–36.0)
MCV: 97 fL (ref 78.0–100.0)
Platelets: 145 10*3/uL — ABNORMAL LOW (ref 150–400)
RBC: 4.39 MIL/uL (ref 3.87–5.11)
RDW: 16.2 % — ABNORMAL HIGH (ref 11.5–15.5)
WBC: 9.1 10*3/uL (ref 4.0–10.5)

## 2017-01-12 LAB — BASIC METABOLIC PANEL
Anion gap: 6 (ref 5–15)
BUN: 16 mg/dL (ref 6–20)
CO2: 27 mmol/L (ref 22–32)
Calcium: 8.5 mg/dL — ABNORMAL LOW (ref 8.9–10.3)
Chloride: 106 mmol/L (ref 101–111)
Creatinine, Ser: 0.88 mg/dL (ref 0.44–1.00)
GFR calc Af Amer: >60 mL/min (ref >60)
GFR calc non Af Amer: >60 mL/min (ref >60)
Glucose, Bld: 275 mg/dL — ABNORMAL HIGH (ref 65–99)
Potassium: 4.8 mmol/L (ref 3.5–5.1)
Sodium: 139 mmol/L (ref 135–145)

## 2017-01-12 LAB — URINE CULTURE: Culture: NO GROWTH

## 2017-01-12 MED ORDER — LOPERAMIDE HCL 2 MG PO CAPS: 2.0000 mg | ORAL_CAPSULE | ORAL | 0 refills | Status: DC | PRN

## 2017-01-12 NOTE — Telephone Encounter (Signed)
APT. REMINDER CALL, LMTCB °

## 2017-01-12 NOTE — Care Management Note (Signed)
Case Management Note  Patient Details  Name: Jennifer [name] MRN: [id number] Date of Birth: [birth year]  Subjective/Objective:                 Patient admitted with acute encephalopathy to step down. Patient transferred form stepdown over weekend, will DC to home with [facility] Inpatient services today provided through [facility]. Referral made to [facility], clinical liaison. Patient follows with [facility] as well.    Action/Plan:  DC to home with HH.  Expected Discharge Date:  01/12/17               Expected Discharge Plan:  [place]  In-House Referral:     Discharge planning Services  CM Consult  Post Acute Care Choice:  Resumption of Svcs/PTA Provider Choice offered to:     DME Arranged:    DME Agency:     HH Arranged:  RN, Disease Management, PT [place] Agency:  [place] [employer] (now [facility])  Status of Service:  Completed, signed off  If discussed at [employer], dates discussed:    Additional Comments:  Carles Collet, RN 01/12/2017, 11:54 AM

## 2017-01-12 NOTE — Care Management Important Message (Signed)
Important Message  Patient Details  Name: Jennifer [name] MRN: [id number] Date of Birth: 04/19/1961   Medicare Important Message Given:  Yes    [name] 01/12/2017, 1:56 PM

## 2017-01-12 NOTE — Progress Notes (Addendum)
Inpatient Diabetes Program Recommendations  AACE/ADA: New Consensus Statement on Inpatient Glycemic Control (2015)  Target Ranges:  Prepandial:   less than 140 mg/dL      Peak postprandial:   less than 180 mg/dL (1-2 hours)      Critically ill patients:  140 - 180 mg/dL   Lab Results  Component Value Date   GLUCAP 265 (H) 01/12/2017   HGBA1C 8.5 (H) 11/07/2016   Results for Jennifer [name], Jennifer [name] (MRN [id number]) as of 01/12/2017 11:25  Ref. Range 01/11/2017 08:31 01/11/2017 12:28 01/11/2017 17:01 01/11/2017 22:12 01/12/2017 07:40  Glucose-Capillary Latest Ref Range: 65 - 99 mg/dL 316 (H) 238 (H) 209 (H) 159 (H) 265 (H)    Review of Glycemic Control  Diabetes history: DM2 with complications, Obesity Outpatient Diabetes medications: not taking Current orders for Inpatient glycemic control: Novolog 0-20 units TIDAC and 0-5 units QHS, Novolog 6 units TIDAC meal coverage  Inpatient Diabetes Program Recommendations:    Please consider Lantus 25 units daily (0.2 units per Kg).    Please continue with Novolog  meal coverage, but please consider decreasing Novolog correction scale to sensitive TIDACHS.  Text page to MD sent.     Thank you,  Windy Carina, RN, MSN Diabetes Coordinator Inpatient Diabetes Program [phone] (Team Pager)

## 2017-01-12 NOTE — Progress Notes (Signed)
Subjective: Still having burning with urination, but otherwise feels well and back to normal.  Understands importance of following up with PCP to continue managing diabetes.  Not complaining of diarrhea.  Ready to go home today.  Discussed previously questions regarding her medication compliance, drug, and alcohol use as being related to the medical condition that led to her hospitalization, and that since she was confused her testimony was not sufficient.    Objective:  Vital signs in last 24 hours: Vitals:   01/11/17 0920 01/11/17 1540 01/11/17 2214 01/12/17 0538  BP:  (!) 139/58 (!) 151/60 127/65  Pulse:  60 62 60  Resp:  17 18 18   Temp:  98.9 F (37.2 C) 97.5 F (36.4 C) 97.8 F (36.6 C)  TempSrc:  Oral    SpO2: 91% 100% 91% 99%  Weight:      Height:       Physical Exam  Constitutional: She is oriented to person, place, and time.  Obese woman, sitting up in bed in no distress  Cardiovascular: Normal rate and regular rhythm.   Pulmonary/Chest: Effort normal and breath sounds normal.  Genitourinary:  Genitourinary Comments: Vulva without white plaque, discharge, or odor  Musculoskeletal:  L BKA  Neurological: She is alert and oriented to person, place, and time. No cranial nerve deficit.  Skin:  Groin and pannus with hyperpigmentation in skin folds    CBC Latest Ref Rng & Units 01/09/2017 01/08/2017 01/07/2017  WBC 4.0 - 10.5 K/uL 10.7(H) 10.4 11.2(H)  Hemoglobin 12.0 - 15.0 g/dL 14.4 14.6 14.0  Hematocrit 36.0 - 46.0 % 43.6 45.6 42.2  Platelets 150 - 400 K/uL 135(L) 149(L) 159   BMP Latest Ref Rng & Units 01/11/2017 01/10/2017 01/09/2017  Glucose 65 - 99 mg/dL 312(H) 220(H) 231(H)  BUN 6 - 20 mg/dL 18 13 13   Creatinine 0.44 - 1.00 mg/dL 0.99 0.94 0.81  BUN/Creat Ratio 9 - 23 - - -  Sodium 135 - 145 mmol/L 136 138 140  Potassium 3.5 - 5.1 mmol/L 4.3 3.7 3.7  Chloride 101 - 111 mmol/L 106 109 113(H)  CO2 22 - 32 mmol/L 22 22 22   Calcium 8.9 - 10.3 mg/dL 8.3(L) 7.9(L)  7.7(L)   Blood Cultures 01/06/2917 No growth 5 days  Urine Culture 01/06/2017 No growth  Urine Culture 01/10/2017 Pending  Assessment/Plan:  Principal Problem:   Acute encephalopathy Active Problems:   DM (diabetes mellitus) type II uncontrolled, periph vascular disorder (HCC)   Hypertension associated with diabetes ([place])   Chronic diastolic heart failure (HCC)   S/P BKA (below knee amputation) unilateral (HCC)   Diabetic osteomyelitis (HCC)   Rhabdomyolysis   Non compliance w medication regimen   Dysuria   56 y.o. female with acute encephalopathy of uncertain etiology which has now resolved overnight on HD2 after holding sedating medicaitons.   Stroke, given her risk factors, acute change, and association with fall is high on the differential.  No evidence of toxic/metabolic encephalopathy has been identified, including B12 deficiency, hyperammonemia, electrolyte disturbance, and drug/alcohol intoxication.  She does have a diabetic foot wound and osteomyelitis, but does not have signs of systemic infection and septic encephalopathy is unlikely.    #Acute Encephalopathy Thorough work-up has not identified any identifying etiology. Resolution of encephalopathy suggestive of polypharmacy as her symptoms resolved essentially with observation off medications. MRI brain with no acute abnormalities.  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 -Hold deleriogenic meds (baclofen, trazodone, varenicline,  diphenhydramine, sertraline) -Resumed pregabalin and percocet per pt request -f/u heavy metals  #Osteomyelitis Per Dr Zenovia Jarred notes, plan for management with IV antibiotics per wound care, though she has not had PICC placed. Discussed osteomyelitis with wound care physician on 1/19, has been referred to podiatry for possible amputation, does not need IV abx.  Bone biopsy confirmed osteo, culture negative, recommended  suppressive augmentin and doxycycline for 7 day course which was filled at pharmacy on 1/12.  Discussed this with patient who provided confirmation that she would likely need amputation.  She reports missing her podiatry appointment due to hospitalization and will need rescheduling at discharge. -F/u with wound care center and podiatry at discharge  #Diarrhea Onset appears related to starting Augmentin and Doxycycline as an outpatient.  No allergy or hypersensitivity noted.  C. Diff was negative on 01/07/2017 so do not suspect this playing a role right now.  Likely antibiotic related.  3 stool occurrences documented in chart, patient reports more than this. - Imodium after each BM with titration to 2-3 BM per day - Discontinue antibiotics - Low threshold to repeat C Diff if symptoms progress - Dr Juleen China called patient pharmacy ([facility]) on 1/20 to inquire about recent antibiotic history as patient is unable to accurately detail which, if any, antibiotics she recently was taking.  Pharmacy reports patient picked up Augmentin and Doxycycline on 1/12 and was prescribed a 7-day course by Dr. Dellia [facility]).  Patient history of diarrhea seems to correlate with starting these medications as best I can tell.  Since admission, she also received Vanc and Cefepime prior to de-escalating back to Augmentin and Doxycycline and then discontinuing Abx.  #Dysuria  #Vaginal Bleeding Reports burning when she urinates and bleeding from her genital area with scant blood on toilet paper.  Bleeding most likely represents hematuria.  UA with glucosuria, hemoglobinuria, proteinuria, and pyuria.  Nonetheless, she has been on antibiotics since admission and so UTI seems less likely.  No evidence of vulvovaginal candidiasis, bacterial vaginosis would not explain her dysuria.  She had a foley placed early in the admission which could have caused urethral trauma. - f/u urine culture - outpatient  follow-up  #Hypokalemia Stable -Continue home 40 mEq KCl daily -Follow electrolytes  #HTN -Continue home amlodipine 10 mg daily, atenolol 100 mg daily, benazepril 40 mg daily and HCTZ 25 mg daily -Hold lasix 40 mg daily  #DM BG continue to be elevated last 24 hours despite escalating her insulin.   Poor compliance with home insulin regimen suspected in the past, uncertain what she has been taking recently.  Home meds reported as Humalog 75/25 140 units QAM and 85 units QPM, reported as not taking on 01/07/2017 -increased to SSI - resistant with HS coverage on 1/20 - 6 U NovoLog TID AC  Fluids: None Diet: heart/carb DVT Prophylaxis: lovenox Code Status: full  Dispo: Anticipated discharge today.  Minus Liberty, MD 01/12/2017, 6:52 AM Pager: [phone]

## 2017-01-12 NOTE — Consult Note (Signed)
   [facility] CM Inpatient Consult   01/12/2017  GRABRIELA SCHELLER [birth year] [id number]    Went to visit Ms. Faine prior to discharge. She endorses she wants ongoing [facility] follow up. States she will have [facility] for [employer] as well. She is aware [facility] Care Management does not interfere or replace services provided by home health. She also states she has her upcoming MD appointments and that she will likely have her small toe amputated in the future. She states the gravity of this is just starting to hit her. Made her aware that [facility] will follow up post discharge. Inpatient RNCM aware that [facility] will follow. Will alert [facility] team of Ms. Karner's discharge today.   Marthenia Rolling, MSN-Ed, RN,BSN [facility] Liaison [phone]

## 2017-01-12 NOTE — Discharge Summary (Signed)
Name: Jennifer [name] MRN: [id number] DOB: 12-31-1960 56 y.o. PCP: Bartholomew Crews, MD  Date of Admission: 01/06/2017  6:29 PM Date of Discharge: 01/12/2017 Attending Physician: Lucious Groves, DO  Discharge Diagnosis:  Principal Problem:   Acute encephalopathy Active Problems:   DM (diabetes mellitus) type II uncontrolled, periph vascular disorder ([place])   Hypertension associated with diabetes ([place])   Chronic diastolic heart failure (HCC)   S/P BKA (below knee amputation) unilateral (HCC)   Diabetic osteomyelitis (HCC)   Rhabdomyolysis   Non compliance w medication regimen   Dysuria   Discharge Medications: Allergies as of 01/12/2017      Reactions   Abilify [aripiprazole] Other (See Comments)   Urinary freq Nov 2016   Iohexol     Desc: IV CONTRAST CAUSE NEPHROPATHY IN 2007   Ivp Dye [iodinated Diagnostic Agents]    Morphine Sulfate Itching, Rash      Medication List    STOP taking these medications   benzonatate 100 MG capsule Commonly known as:  TESSALON PERLES   diphenhydrAMINE 25 MG tablet Commonly known as:  BENADRYL   fluticasone 50 MCG/ACT nasal spray Commonly known as:  FLONASE   lidocaine 5 % Commonly known as:  LIDODERM   nystatin 100000 UNIT/ML suspension Commonly known as:  MYCOSTATIN   predniSONE 20 MG tablet Commonly known as:  DELTASONE   varenicline 0.5 MG tablet Commonly known as:  CHANTIX   varenicline 1 MG tablet Commonly known as:  CHANTIX     TAKE these medications   ADVAIR DISKUS 250-50 MCG/DOSE Aepb Generic drug:  Fluticasone-Salmeterol TAKE 1 INHALATION BY MOUTH TWICE DAILY   amLODipine 10 MG tablet Commonly known as:  NORVASC TAKE 1 TABLET BY MOUTH EVERY DAY   atenolol 100 MG tablet Commonly known as:  TENORMIN Take 1 tablet (100 mg total) by mouth daily.   baclofen 10 MG tablet Commonly known as:  LIORESAL Take 1 tablet (10 mg total) by mouth 3 (three) times daily as needed for muscle spasms. Reported on  06/27/2016   benazepril 40 MG tablet Commonly known as:  LOTENSIN Take 40 mg by mouth daily.   EASY TOUCH PEN NEEDLES 31G X 8 MM Misc Generic drug:  Insulin Pen Needle USE TO INJECT INSULIN TWICE DAILY   furosemide 40 MG tablet Commonly known as:  LASIX Take 1 tablet (40 mg total) by mouth daily as needed.   hydrochlorothiazide 25 MG tablet Commonly known as:  HYDRODIURIL Take 1 tablet (25 mg total) by mouth daily.   Insulin Lispro Prot & Lispro (75-25) 100 UNIT/ML Kwikpen Commonly known as:  HUMALOG MIX 75/25 KWIKPEN Inject 140 units subcutaneously before breakfast and 85 units before supper   ipratropium 0.02 % nebulizer solution Commonly known as:  ATROVENT USE 1 VIAL VIA NEBULIZER EVERY 6 HOURS AS NEEDED FOR WHEEZING   loperamide 2 MG capsule Commonly known as:  IMODIUM Take 1 capsule (2 mg total) by mouth as needed for diarrhea or loose stools.   LYRICA 200 MG capsule Generic drug:  pregabalin TAKE 1 CAPSULE BY MOUTH THREE TIMES A DAY   metFORMIN 500 MG 24 hr tablet Commonly known as:  GLUCOPHAGE XR Take 1 tablet (500 mg total) by mouth daily with breakfast.   nystatin powder Commonly known as:  MYCOSTATIN/NYSTOP APPLY TO AFFECTED AREA THREE TIMES A DAY   ONETOUCH DELICA LANCETS 99991111 Misc USE TO CHECK BLOOD SUGARS FOUR TIMES A DAY   ONETOUCH VERIO test strip Generic drug:  glucose blood USE 3-4 TIMES DAILY TO CHECK BLOOD SUGAR   oxyCODONE-acetaminophen 5-325 MG tablet Commonly known as:  PERCOCET/ROXICET Take 1-2 tablets by mouth every 4 (four) hours as needed for severe pain.   pantoprazole 40 MG tablet Commonly known as:  PROTONIX TAKE 1 TABLET (40 MG TOTAL) BY MOUTH DAILY.   potassium chloride SA 20 MEQ tablet Commonly known as:  K-DUR,KLOR-CON TAKE 2 TABLETS BY MOUTH DAILY   PROAIR HFA 108 (90 Base) MCG/ACT inhaler Generic drug:  albuterol INHALE 2 PUFFS BY MOUTH EVERY 6 HOURS AS NEEDED FOR WHEEZING   albuterol (2.5 MG/3ML) 0.083% nebulizer  solution Commonly known as:  PROVENTIL INHALE THE CONTENTS OF 1 VIAL VIA NEBULIZER EVERY 6 HOURS AS NEEDED FOR WHEEZING   ranitidine 150 MG tablet Commonly known as:  ZANTAC Take 1 tablet (150 mg total) by mouth 2 (two) times daily.   rosuvastatin 20 MG tablet Commonly known as:  CRESTOR Take 20 mg by mouth at bedtime.   sertraline 100 MG tablet Commonly known as:  ZOLOFT Take 1 tablet (100 mg total) by mouth daily.   traZODone 100 MG tablet Commonly known as:  DESYREL TAKE 1 TABLET BY MOUTH DAILY AT BEDTIME AS NEEDED FOR SLEEP       Disposition and follow-up:   [name] was discharged from [facility] in Stable condition.  At the hospital follow up visit please address:  1.  Dysuria and Hematuria.  Ask about urinary symptoms.  She had dysuria and hematuria towards the end of her hospitalization without convincing evidence of bacterial or candidal infection.  She did have a foley catheter place early in admission.  2.  Encephalopathy.  Check orientation, ask about confusion and sedation.  Review sedating and deleriogenic meds.  3.  Diarrhea. Consider repeat C Diff testing (negative on 1/17) if persistent symptoms with recent antibiotics.  4.  Osteomyelitis.  Managed by [facility] center, ensure surgical follow-up is arranged.  5.  Medication compliance.  Very defensive when asked about her meds, endorsing always taking them as prescribed, but also reporting not taking some (KCl, sertraline as needed, did not remember recent antibiotics) and pharmacy with questionable history of filling prescriptions.  6.  Labs / imaging needed at time of follow-up: none  7.  Pending labs/ test needing follow-up: urine culture, heavy metals, consider repeat C Diff  Follow-up Appointments: Follow-up Information    BUTCHER,ELIZABETH, MD. Go in 2 day(s).   Specialty:  Internal Medicine Why:  at 1:30 pm Contact information: [place]  [id number] [facility] Course by problem list: Principal Problem:   Acute encephalopathy Active Problems:   DM (diabetes mellitus) type II uncontrolled, periph vascular disorder ([place])   Hypertension associated with diabetes ([place])   Chronic diastolic heart failure (HCC)   S/P BKA (below knee amputation) unilateral (HCC)   Diabetic osteomyelitis (HCC)   Rhabdomyolysis   Non compliance w medication regimen   Dysuria   1. Encephalopathy Ms Ivanov is a 56 year old woman with history of DM2, HTN, and osteomyelitis who was brought to the ED by EMS for lethargy and confusion.  Two days prior to admission, she had a fall while going to the toilet in the middle of the night, and after that was confused according to her son Georgina Snell.  She had a normal head CT, nonfocal neurologic exam, and was afebrile with borderline leukocytosis.  Potentially deleriogenic home meds (baclofen, percocet, trazodone, varenicline, diphenhydramine, pregabalin, sertraline) were held, and her encephalopathy resolved on HD2.  She denied alcohol or drug use, and UDS and blood EtOH levels were negative.  No other toxic/metabolic etiologies could be identified (no hypoglycemia, normal B12, ammonia, TSH, RPR), but she was empirically given thiamine.  With her multiple risk factors for stroke and recent fall, MRI was obtained which did not show acute infarction.  No specific etiology was ultimately identified for her altered mental status, but polypharmacy or medication noncompliance remains a possible cause.  2. Osteomyelitis She has a diabetic wound on the dorsolateral surface of her R fifth toe and osteomyelitis was recently diagnosed on MRI ordered by her wound care provider on 12/27/2016.  Per telephone discussion with the wound care physician, she was prescribed a 7 day course of doxycycline and augmentin and referred to Podiatry for amputation.  She had no systemic signs of infection, but she was treated  empirically with cefepime and vancomycin initially with concern for septic encephalopathy.  Antibiosis was deescalated to doxycycline and augmentin, and discontinued before discharge.    3. Rhabodomyolysis Globins without RBCs in her urine and elevated serum CK.  Renal function remained at baseline and CK fell with IVF.  Potentially related to her fall prior to admission or oversedation with malpositioning.  4. Dysuria She had a foley catheter in place while encephalopathic, and about 2 days after its removal reported dysuria and hematuria.  With her broad spectrum antibiotics during admission, bacterial infection seems unlikely.  There was no evidence of vulvovaginal candidiasis.  Could represent abrasions from urinary catheterization, but will required outpatient follow-up to ensure resolution.  5. DM Hyperglycemic with CBGs mostly in 200s with ~10-12U mealtime and/or corrective insulin.  Emphatically endorsed taking prescribed metformin, and 75/25 140U-85U as prescribed and did not want to change her home insulin dosing on discharge.  6. HTN Continued on home amlodipine, atenolol, benazepril, and HCTZ.  7. Diarrhea Reports dozens of episodes of diarrhea per day, but 3-4 loose stools were recorded daily.  May have been associated with starting antibiotics, but continued for 1-2 days after their discontinuation.  C Diff was negative on 01/07/2017.  Treated symptomatically with immodium.    Discharge Vitals:   BP 127/65 (BP Location: Left Arm)   Pulse 60   Temp 97.8 F (36.6 C)   Resp 18   Ht 5\' 7"  (1.702 m) Comment: 12/12/16  Wt 279 lb (126.6 kg) Comment: 01/01/17  SpO2 97%   BMI 43.70 kg/m   Pertinent Labs, Studies, and Procedures:  CBC Latest Ref Rng & Units 01/12/2017 01/09/2017 01/08/2017  WBC 4.0 - 10.5 K/uL 9.1 10.7(H) 10.4  Hemoglobin 12.0 - 15.0 g/dL 13.4 14.4 14.6  Hematocrit 36.0 - 46.0 % 42.6 43.6 45.6  Platelets 150 - 400 K/uL 145(L) 135(L) 149(L)   CMP Latest Ref Rng &  Units 01/12/2017 01/11/2017 01/10/2017  Glucose 65 - 99 mg/dL 275(H) 312(H) 220(H)  BUN 6 - 20 mg/dL 16 18 13   Creatinine 0.44 - 1.00 mg/dL 0.88 0.99 0.94  Sodium 135 - 145 mmol/L 139 136 138  Potassium 3.5 - 5.1 mmol/L 4.8 4.3 3.7  Chloride 101 - 111 mmol/L 106 106 109  CO2 22 - 32 mmol/L 27 22 22   Calcium 8.9 - 10.3 mg/dL 8.5(L) 8.3(L) 7.9(L)  Total Protein 6.5 - 8.1 g/dL - - -  Total Bilirubin 0.3 - 1.2 mg/dL - - -  Alkaline Phos 38 - 126 U/L - - -  AST 15 - 41 U/L - - -  ALT 14 - 54 U/L - - -   Urinalysis    Component Value Date/Time   COLORURINE AMBER (A) 01/10/2017 1747   APPEARANCEUR CLOUDY (A) 01/10/2017 1747   LABSPEC 1.023 01/10/2017 1747   PHURINE 5.0 01/10/2017 1747   GLUCOSEU >=500 (A) 01/10/2017 1747   GLUCOSEU > 1000 mg/dL (A) 04/19/2007 2009   HGBUR MODERATE (A) 01/10/2017 1747   BILIRUBINUR NEGATIVE 01/10/2017 1747   KETONESUR 5 (A) 01/10/2017 1747   PROTEINUR 100 (A) 01/10/2017 1747   UROBILINOGEN 0.2 07/07/2013 0321   NITRITE NEGATIVE 01/10/2017 1747   LEUKOCYTESUR LARGE (A) 01/10/2017 1747   Drugs of Abuse     Component Value Date/Time   LABOPIA NONE DETECTED 01/06/2017 2156   COCAINSCRNUR NONE DETECTED 01/06/2017 2156   COCAINSCRNUR NEG 02/28/2014 1222   LABBENZ NONE DETECTED 01/06/2017 2156   LABBENZ NEG 06/28/2010 2130   AMPHETMU NONE DETECTED 01/06/2017 2156   THCU NONE DETECTED 01/06/2017 2156   LABBARB NONE DETECTED 01/06/2017 2156   Component     Latest Ref Rng & Units 01/07/2017 01/08/2017  CK Total     38 - 234 U/L 5,058 (H) 2,674 (H)   MRI Brain 01/09/2017 IMPRESSION: No acute intracranial abnormality  Periventricular and deep white matter hyperintensities unchanged and 2008. Given the patient's strong risk factors for vascular disease, these are most likely related to chronic infarcts. Demyelinating disease could have a similar appearance.  Discharge Instructions: Discharge Instructions    Diet - low sodium heart healthy     Complete by:  As directed    Increase activity slowly    Complete by:  As directed      You were admitted to the hospital because you were very confused.  We're not entirely sure why you were confused; we do not think you had a stroke, seizure, or infection leading to the confusion.  It could be because of a medication or multiple medications, as sometimes they can make people confused.  You got back to normal after we did not give you your medicines, including pain medicines, for a few days.  Make sure you take your medications only as prescribed, and talk to Dr [name] about what you are taking and any side effects you are worried about.  Call the Wound Care center about referral to Podiatry about your toe, or call the Podiatrist for another appointment if you have already been referred.  Signed: Minus Liberty, MD 01/12/2017, 11:11 AM   Pager: [phone]

## 2017-01-12 NOTE — Progress Notes (Signed)
Pt reported body itching. MD was notified. Claritin was ordered and given to pt. Pt reported that's she preferred Benadryl. However, MD explained that pt was having problems with drowsiness, hence the Claritin rather than Benadryl. Pt was informed. Claritin was given. Will continue to monitor.

## 2017-01-12 NOTE — Progress Notes (Signed)
Internal Medicine Attending:   I saw and examined the patient. I reviewed the resident's note and I agree with the resident's findings and plan as documented in the resident's note. Jennifer [name] is doing well, she is completely back to her baseline mental status.  She reports that she understands why we had to ask sensitive questions to find out why she became confused.  Our best guess to the cause of her acute encephalopathy is polypharmacy although she maintains that she only takes prescribed medications exactly as prescribed (although she did admit today that she has before doubled up on medications by accident and it made her very drowsy.)  Her complaints today are dysuria and diarrhea.  We suspect the diarrhea is due to antibiotic use and her C diff testing is negative.  She needs surgical amputation of her diabetic foot infection and chronic osteomyelitis but she does not currently have signs of active infection therefore we have discontinued her antibiotics.  Her dysuria (and hematuria) I suspect is due to trauma from the foley catheter.  I would recommend repeating a U/A in a few weeks as an outpatient to ensure clearance.  We have a repeat urine culture pending which we will follow up.  Otherwise she is stable for discharge home today.

## 2017-01-13 ENCOUNTER — Encounter: Payer: Self-pay | Admitting: Internal Medicine

## 2017-01-13 ENCOUNTER — Ambulatory Visit (INDEPENDENT_AMBULATORY_CARE_PROVIDER_SITE_OTHER): Payer: Medicare Other | Admitting: Internal Medicine

## 2017-01-13 VITALS — BP 165/68 | HR 71 | Temp 98.2°F | Ht 67.0 in | Wt 272.9 lb

## 2017-01-13 DIAGNOSIS — E1151 Type 2 diabetes mellitus with diabetic peripheral angiopathy without gangrene: Secondary | ICD-10-CM | POA: Diagnosis not present

## 2017-01-13 DIAGNOSIS — R300 Dysuria: Secondary | ICD-10-CM | POA: Diagnosis not present

## 2017-01-13 DIAGNOSIS — I5032 Chronic diastolic (congestive) heart failure: Secondary | ICD-10-CM | POA: Diagnosis not present

## 2017-01-13 DIAGNOSIS — M797 Fibromyalgia: Secondary | ICD-10-CM

## 2017-01-13 DIAGNOSIS — M869 Osteomyelitis, unspecified: Secondary | ICD-10-CM

## 2017-01-13 DIAGNOSIS — E11621 Type 2 diabetes mellitus with foot ulcer: Secondary | ICD-10-CM | POA: Diagnosis not present

## 2017-01-13 DIAGNOSIS — M86171 Other acute osteomyelitis, right ankle and foot: Secondary | ICD-10-CM

## 2017-01-13 DIAGNOSIS — B9689 Other specified bacterial agents as the cause of diseases classified elsewhere: Secondary | ICD-10-CM

## 2017-01-13 DIAGNOSIS — M19012 Primary osteoarthritis, left shoulder: Secondary | ICD-10-CM

## 2017-01-13 DIAGNOSIS — R102 Pelvic and perineal pain: Secondary | ICD-10-CM

## 2017-01-13 DIAGNOSIS — E11319 Type 2 diabetes mellitus with unspecified diabetic retinopathy without macular edema: Secondary | ICD-10-CM | POA: Diagnosis not present

## 2017-01-13 DIAGNOSIS — Z026 Encounter for examination for insurance purposes: Secondary | ICD-10-CM | POA: Diagnosis not present

## 2017-01-13 DIAGNOSIS — E1142 Type 2 diabetes mellitus with diabetic polyneuropathy: Secondary | ICD-10-CM | POA: Diagnosis not present

## 2017-01-13 DIAGNOSIS — E1121 Type 2 diabetes mellitus with diabetic nephropathy: Secondary | ICD-10-CM | POA: Diagnosis not present

## 2017-01-13 DIAGNOSIS — E785 Hyperlipidemia, unspecified: Secondary | ICD-10-CM | POA: Diagnosis not present

## 2017-01-13 DIAGNOSIS — R197 Diarrhea, unspecified: Secondary | ICD-10-CM

## 2017-01-13 DIAGNOSIS — Z794 Long term (current) use of insulin: Secondary | ICD-10-CM

## 2017-01-13 DIAGNOSIS — L97511 Non-pressure chronic ulcer of other part of right foot limited to breakdown of skin: Secondary | ICD-10-CM | POA: Diagnosis not present

## 2017-01-13 DIAGNOSIS — M19011 Primary osteoarthritis, right shoulder: Secondary | ICD-10-CM

## 2017-01-13 DIAGNOSIS — G894 Chronic pain syndrome: Secondary | ICD-10-CM | POA: Diagnosis not present

## 2017-01-13 DIAGNOSIS — E1159 Type 2 diabetes mellitus with other circulatory complications: Secondary | ICD-10-CM | POA: Diagnosis not present

## 2017-01-13 DIAGNOSIS — F5104 Psychophysiologic insomnia: Secondary | ICD-10-CM

## 2017-01-13 DIAGNOSIS — Z09 Encounter for follow-up examination after completed treatment for conditions other than malignant neoplasm: Secondary | ICD-10-CM

## 2017-01-13 DIAGNOSIS — J441 Chronic obstructive pulmonary disease with (acute) exacerbation: Secondary | ICD-10-CM | POA: Diagnosis not present

## 2017-01-13 DIAGNOSIS — Z89512 Acquired absence of left leg below knee: Secondary | ICD-10-CM

## 2017-01-13 DIAGNOSIS — E1169 Type 2 diabetes mellitus with other specified complication: Secondary | ICD-10-CM

## 2017-01-13 DIAGNOSIS — Z89519 Acquired absence of unspecified leg below knee: Secondary | ICD-10-CM

## 2017-01-13 DIAGNOSIS — I110 Hypertensive heart disease with heart failure: Secondary | ICD-10-CM | POA: Diagnosis not present

## 2017-01-13 DIAGNOSIS — Z8661 Personal history of infections of the central nervous system: Secondary | ICD-10-CM

## 2017-01-13 LAB — HEAVY METALS, BLOOD
Arsenic: 3 ug/L (ref 2–23)
Lead: NOT DETECTED ug/dL (ref 0–19)
Mercury: NOT DETECTED ug/L (ref 0.0–14.9)

## 2017-01-13 MED ORDER — PHENAZOPYRIDINE HCL 200 MG PO TABS: 200.0000 mg | ORAL_TABLET | Freq: Three times a day (TID) | ORAL | 0 refills | Status: DC | PRN

## 2017-01-13 NOTE — Patient Instructions (Signed)
1. See me in Feb or March

## 2017-01-13 NOTE — Assessment & Plan Note (Signed)
This problem is established and unchanged. Pt states that her wound care MD has referred her to Dr Amalia Hailey and appt tomorrow. Pt states it is to get her foot amputated. We discussed that Dr Amalia Hailey will make his own assessment and tx recs. We discussed that amputation would likely require a hospitalization 2/2 medical co morbidities.   PLAN : F/U recs

## 2017-01-13 NOTE — Assessment & Plan Note (Signed)
Jennifer [name] is here for a mobility examination for powerchair assessment. The patient's medical conditions which have contributed to the powerchair need include s/p L BKA< chronic diffuse pain, and new R foot osteomyelitis. The patient requires the powerchair to complete ADL's including bathing, dressing, tolieting. The patient is  unable to use a cane or walker due to inability to walk more than 10 feet, inability to stand more than 10 minutes, s/p L BKA and unable to use a manual chair due to unable to propel manual chair due to pain in R foot from osteomyelitis and inability to use L leg 2/2 prior BKA and inability to propel using arms 2/2 chronic shoulder pain 2/2 OA and prior B rotator cuff surgery. The patient is unable to use a scooter due to inability to transfer to scooter. The patient's pain today is a 10 on a scale of 1-10. The patient's muscle strength rated on a 0-5 scale is listed in the exam. Jennifer [name] has the physical and mental ability to operate a power wheelchair safely in his home and is motivated to use the power wheelchair.

## 2017-01-13 NOTE — Assessment & Plan Note (Signed)
This problem is est and no better. Started having dysuria in the hospital (had foley). UA and cx negative. Had been on ABX at admission. Not D/C'd on ABX. Vagina hurts while peeing. Then hurts so bad shakes all over. Endorses dysuria. Denies vaginal D/C. No temp.  A : Vaginal and pelvic pain. Could be pain left over from foley instrumentation.  P : doubt UTI but did have foley and cx obtained likely while on ABX. Repeat UA and Cx. No ABX unless infxn found. Will rec pyridium (GFR > 60).

## 2017-01-13 NOTE — Assessment & Plan Note (Addendum)
This problem is established but no better. She started to have D in the hospital. C Diff negative but was on ABX. Most recent ABX augmentin and doxy and appears to have been D/C'd yesterday. Documented 3-4 stools per day in hospital but c/o 12 per day. Pt continues to c/o 12 episodes per day, liquid stool without assoc ABD pain. No blood. Took 3 imodium yesterday without relief. Can occur without PO intake. Pt was eating in the exam room.  A : likely ABX assoc diarrhea (augmentin) but high risk for c diff  Plan : C diff test.

## 2017-01-13 NOTE — Progress Notes (Signed)
   Subjective:    [name], female    DOB: [birth year], 56 y.o.   MRN: [id number]  HPI  KAREEN WILLOW is here for powerchair assessment. Please see the A&P for the status of the pt's chronic medical problems.  ROS : per ROS section and in problem oriented charting. All other systems are negative.  PMHx, Soc hx, and / or Fam hx : D/C'd yesterday after admission for acute encephalopathy.   Review of Systems  Gastrointestinal: Positive for diarrhea. Negative for abdominal distention.  Genitourinary: Positive for difficulty urinating, dysuria and vaginal pain. Negative for decreased urine volume and vaginal discharge.  Musculoskeletal: Positive for arthralgias, back pain, gait problem and myalgias.  Skin: Negative for rash and wound.  Neurological: Positive for tremors.       Objective:   Physical Exam  Constitutional: She is oriented to person, place, and time. She appears well-developed and well-nourished. No distress.  HENT:  Head: Normocephalic and atraumatic.  Right Ear: External ear normal.  Left Ear: External ear normal.  Nose: Nose normal.  Eyes: Conjunctivae and EOM are normal.  Musculoskeletal: She exhibits edema.  Neurological: She is alert and oriented to person, place, and time.  Strength 5/5 except shoulder 4+/5 B; hip flexion R 4/5, knee extension 4/5 R. LLE strength not tested 2/2 s/p BKA.  Skin: Skin is warm and dry. She is not diaphoretic.  Psychiatric: She has a normal mood and affect. Her behavior is normal. Judgment and thought content normal.          Assessment & Plan:

## 2017-01-14 ENCOUNTER — Emergency Department (HOSPITAL_COMMUNITY): Payer: Medicare Other

## 2017-01-14 ENCOUNTER — Ambulatory Visit (INDEPENDENT_AMBULATORY_CARE_PROVIDER_SITE_OTHER): Payer: Medicare Other | Admitting: Podiatry

## 2017-01-14 ENCOUNTER — Other Ambulatory Visit: Payer: Self-pay

## 2017-01-14 ENCOUNTER — Encounter (HOSPITAL_COMMUNITY): Payer: Self-pay | Admitting: Emergency Medicine

## 2017-01-14 VITALS — BP 151/75 | HR 68

## 2017-01-14 DIAGNOSIS — L03031 Cellulitis of right toe: Secondary | ICD-10-CM | POA: Diagnosis not present

## 2017-01-14 DIAGNOSIS — Z95828 Presence of other vascular implants and grafts: Secondary | ICD-10-CM | POA: Diagnosis not present

## 2017-01-14 DIAGNOSIS — Z79899 Other long term (current) drug therapy: Secondary | ICD-10-CM

## 2017-01-14 DIAGNOSIS — Z6841 Body Mass Index (BMI) 40.0 and over, adult: Secondary | ICD-10-CM | POA: Diagnosis not present

## 2017-01-14 DIAGNOSIS — Z91041 Radiographic dye allergy status: Secondary | ICD-10-CM

## 2017-01-14 DIAGNOSIS — Z794 Long term (current) use of insulin: Secondary | ICD-10-CM

## 2017-01-14 DIAGNOSIS — E1169 Type 2 diabetes mellitus with other specified complication: Secondary | ICD-10-CM | POA: Diagnosis not present

## 2017-01-14 DIAGNOSIS — E1142 Type 2 diabetes mellitus with diabetic polyneuropathy: Secondary | ICD-10-CM | POA: Diagnosis not present

## 2017-01-14 DIAGNOSIS — E088 Diabetes mellitus due to underlying condition with unspecified complications: Secondary | ICD-10-CM

## 2017-01-14 DIAGNOSIS — E785 Hyperlipidemia, unspecified: Secondary | ICD-10-CM | POA: Diagnosis present

## 2017-01-14 DIAGNOSIS — J449 Chronic obstructive pulmonary disease, unspecified: Secondary | ICD-10-CM | POA: Diagnosis not present

## 2017-01-14 DIAGNOSIS — L02611 Cutaneous abscess of right foot: Secondary | ICD-10-CM

## 2017-01-14 DIAGNOSIS — Z7951 Long term (current) use of inhaled steroids: Secondary | ICD-10-CM

## 2017-01-14 DIAGNOSIS — I5032 Chronic diastolic (congestive) heart failure: Secondary | ICD-10-CM | POA: Diagnosis present

## 2017-01-14 DIAGNOSIS — Z885 Allergy status to narcotic agent status: Secondary | ICD-10-CM

## 2017-01-14 DIAGNOSIS — J9811 Atelectasis: Secondary | ICD-10-CM | POA: Diagnosis not present

## 2017-01-14 DIAGNOSIS — E1152 Type 2 diabetes mellitus with diabetic peripheral angiopathy with gangrene: Secondary | ICD-10-CM | POA: Diagnosis not present

## 2017-01-14 DIAGNOSIS — I110 Hypertensive heart disease with heart failure: Secondary | ICD-10-CM | POA: Diagnosis not present

## 2017-01-14 DIAGNOSIS — E1165 Type 2 diabetes mellitus with hyperglycemia: Secondary | ICD-10-CM | POA: Diagnosis present

## 2017-01-14 DIAGNOSIS — L03115 Cellulitis of right lower limb: Secondary | ICD-10-CM | POA: Diagnosis not present

## 2017-01-14 DIAGNOSIS — R197 Diarrhea, unspecified: Secondary | ICD-10-CM | POA: Diagnosis present

## 2017-01-14 DIAGNOSIS — Z89512 Acquired absence of left leg below knee: Secondary | ICD-10-CM

## 2017-01-14 DIAGNOSIS — F1120 Opioid dependence, uncomplicated: Secondary | ICD-10-CM | POA: Diagnosis present

## 2017-01-14 DIAGNOSIS — E11319 Type 2 diabetes mellitus with unspecified diabetic retinopathy without macular edema: Secondary | ICD-10-CM | POA: Diagnosis present

## 2017-01-14 DIAGNOSIS — G894 Chronic pain syndrome: Secondary | ICD-10-CM | POA: Diagnosis present

## 2017-01-14 DIAGNOSIS — Z833 Family history of diabetes mellitus: Secondary | ICD-10-CM

## 2017-01-14 DIAGNOSIS — M069 Rheumatoid arthritis, unspecified: Secondary | ICD-10-CM | POA: Diagnosis present

## 2017-01-14 DIAGNOSIS — M86471 Chronic osteomyelitis with draining sinus, right ankle and foot: Secondary | ICD-10-CM

## 2017-01-14 DIAGNOSIS — M869 Osteomyelitis, unspecified: Secondary | ICD-10-CM | POA: Diagnosis present

## 2017-01-14 DIAGNOSIS — E11621 Type 2 diabetes mellitus with foot ulcer: Secondary | ICD-10-CM | POA: Diagnosis present

## 2017-01-14 DIAGNOSIS — F17210 Nicotine dependence, cigarettes, uncomplicated: Secondary | ICD-10-CM | POA: Diagnosis present

## 2017-01-14 DIAGNOSIS — F329 Major depressive disorder, single episode, unspecified: Secondary | ICD-10-CM | POA: Diagnosis present

## 2017-01-14 DIAGNOSIS — K219 Gastro-esophageal reflux disease without esophagitis: Secondary | ICD-10-CM | POA: Diagnosis present

## 2017-01-14 DIAGNOSIS — L97519 Non-pressure chronic ulcer of other part of right foot with unspecified severity: Secondary | ICD-10-CM | POA: Diagnosis present

## 2017-01-14 LAB — CBC WITH DIFFERENTIAL/PLATELET
Basophils Absolute: 0 10*3/uL (ref 0.0–0.1)
Basophils Relative: 0 %
Eosinophils Absolute: 0.2 10*3/uL (ref 0.0–0.7)
Eosinophils Relative: 2 %
HCT: 43 % (ref 36.0–46.0)
Hemoglobin: 13.9 g/dL (ref 12.0–15.0)
Lymphocytes Relative: 55 %
Lymphs Abs: 5.3 10*3/uL — ABNORMAL HIGH (ref 0.7–4.0)
MCH: 30.9 pg (ref 26.0–34.0)
MCHC: 32.3 g/dL (ref 30.0–36.0)
MCV: 95.6 fL (ref 78.0–100.0)
Monocytes Absolute: 0.5 10*3/uL (ref 0.1–1.0)
Monocytes Relative: 5 %
Neutro Abs: 3.7 10*3/uL (ref 1.7–7.7)
Neutrophils Relative %: 38 %
Platelets: 186 10*3/uL (ref 150–400)
RBC: 4.5 MIL/uL (ref 3.87–5.11)
RDW: 15.6 % — ABNORMAL HIGH (ref 11.5–15.5)
WBC: 9.7 10*3/uL (ref 4.0–10.5)

## 2017-01-14 LAB — I-STAT TROPONIN, ED: Troponin i, poc: 0 ng/mL (ref 0.00–0.08)

## 2017-01-14 LAB — MICROSCOPIC EXAMINATION: Casts: NONE SEEN /lpf

## 2017-01-14 LAB — COMPREHENSIVE METABOLIC PANEL
ALT: 34 U/L (ref 14–54)
AST: 35 U/L (ref 15–41)
Albumin: 3.1 g/dL — ABNORMAL LOW (ref 3.5–5.0)
Alkaline Phosphatase: 59 U/L (ref 38–126)
Anion gap: 10 (ref 5–15)
BUN: 12 mg/dL (ref 6–20)
CO2: 25 mmol/L (ref 22–32)
Calcium: 9 mg/dL (ref 8.9–10.3)
Chloride: 107 mmol/L (ref 101–111)
Creatinine, Ser: 0.75 mg/dL (ref 0.44–1.00)
GFR calc Af Amer: >60 mL/min (ref >60)
GFR calc non Af Amer: >60 mL/min (ref >60)
Glucose, Bld: 70 mg/dL (ref 65–99)
Potassium: 4.1 mmol/L (ref 3.5–5.1)
Sodium: 142 mmol/L (ref 135–145)
Total Bilirubin: 0.4 mg/dL (ref 0.3–1.2)
Total Protein: 6 g/dL — ABNORMAL LOW (ref 6.5–8.1)

## 2017-01-14 LAB — URINALYSIS, ROUTINE W REFLEX MICROSCOPIC
Bilirubin, UA: NEGATIVE
Ketones, UA: NEGATIVE
Nitrite, UA: NEGATIVE
Specific Gravity, UA: 1.02 (ref 1.005–1.030)
Urobilinogen, Ur: 0.2 mg/dL (ref 0.2–1.0)
pH, UA: 5.5 (ref 5.0–7.5)

## 2017-01-14 NOTE — Patient Outreach (Signed)
Care Coordination: Report provided to original case manager Tish Men.   Patient discharge from inpatient on 01/12/2017.   Transition of care to be started by [place].   Tomasa Rand, RN, BSN, [facility] [employer] [phone]

## 2017-01-14 NOTE — ED Triage Notes (Signed)
Patient sent to ED for admission for toe amputation.  Patient has Osteomyelitis in right 5th digit on right foot.  She is having pain in that foot.  She has surgery set for 5am in the morning.

## 2017-01-14 NOTE — ED Notes (Signed)
Patient handed RN cell phone stating MD was on the phone.  Pt's referring MD spoke to RN and gave game plan for admission for pt.  This RN informed MD that staff was aware of POC.  Pt sts she called to tell MD to tell us she should not be waiting.  MD confirmed patient okay to wait.

## 2017-01-15 ENCOUNTER — Encounter (HOSPITAL_COMMUNITY): Payer: Self-pay | Admitting: Certified Registered Nurse Anesthetist

## 2017-01-15 ENCOUNTER — Inpatient Hospital Stay (HOSPITAL_COMMUNITY): Payer: Medicare Other | Admitting: Anesthesiology

## 2017-01-15 ENCOUNTER — Encounter (HOSPITAL_COMMUNITY)
Admission: EM | Disposition: A | Discharge status: Home Health | Payer: Self-pay | Source: Home / Self Care | Attending: Internal Medicine

## 2017-01-15 ENCOUNTER — Inpatient Hospital Stay (HOSPITAL_COMMUNITY): Payer: Medicare Other

## 2017-01-15 ENCOUNTER — Other Ambulatory Visit: Payer: Self-pay

## 2017-01-15 ENCOUNTER — Inpatient Hospital Stay (HOSPITAL_COMMUNITY)
Admission: EM | Admit: 2017-01-15 | Discharge: 2017-01-16 | DRG: 617 | Disposition: A | Discharge status: Home Health | Payer: Medicare Other | Attending: Internal Medicine | Admitting: Internal Medicine

## 2017-01-15 DIAGNOSIS — I110 Hypertensive heart disease with heart failure: Secondary | ICD-10-CM

## 2017-01-15 DIAGNOSIS — R197 Diarrhea, unspecified: Secondary | ICD-10-CM | POA: Diagnosis not present

## 2017-01-15 DIAGNOSIS — E785 Hyperlipidemia, unspecified: Secondary | ICD-10-CM | POA: Diagnosis not present

## 2017-01-15 DIAGNOSIS — G8929 Other chronic pain: Secondary | ICD-10-CM | POA: Diagnosis not present

## 2017-01-15 DIAGNOSIS — B9689 Other specified bacterial agents as the cause of diseases classified elsewhere: Secondary | ICD-10-CM

## 2017-01-15 DIAGNOSIS — M869 Osteomyelitis, unspecified: Secondary | ICD-10-CM | POA: Diagnosis present

## 2017-01-15 DIAGNOSIS — Z8249 Family history of ischemic heart disease and other diseases of the circulatory system: Secondary | ICD-10-CM

## 2017-01-15 DIAGNOSIS — J449 Chronic obstructive pulmonary disease, unspecified: Secondary | ICD-10-CM

## 2017-01-15 DIAGNOSIS — Z794 Long term (current) use of insulin: Secondary | ICD-10-CM

## 2017-01-15 DIAGNOSIS — E0865 Diabetes mellitus due to underlying condition with hyperglycemia: Secondary | ICD-10-CM

## 2017-01-15 DIAGNOSIS — M549 Dorsalgia, unspecified: Secondary | ICD-10-CM

## 2017-01-15 DIAGNOSIS — Z89519 Acquired absence of unspecified leg below knee: Secondary | ICD-10-CM

## 2017-01-15 DIAGNOSIS — M86679 Other chronic osteomyelitis, unspecified ankle and foot: Secondary | ICD-10-CM

## 2017-01-15 DIAGNOSIS — Z833 Family history of diabetes mellitus: Secondary | ICD-10-CM

## 2017-01-15 DIAGNOSIS — L02611 Cutaneous abscess of right foot: Secondary | ICD-10-CM

## 2017-01-15 DIAGNOSIS — Z91041 Radiographic dye allergy status: Secondary | ICD-10-CM

## 2017-01-15 DIAGNOSIS — Z7951 Long term (current) use of inhaled steroids: Secondary | ICD-10-CM

## 2017-01-15 DIAGNOSIS — F1120 Opioid dependence, uncomplicated: Secondary | ICD-10-CM | POA: Diagnosis present

## 2017-01-15 DIAGNOSIS — E11621 Type 2 diabetes mellitus with foot ulcer: Secondary | ICD-10-CM | POA: Diagnosis not present

## 2017-01-15 DIAGNOSIS — Z95828 Presence of other vascular implants and grafts: Secondary | ICD-10-CM | POA: Diagnosis not present

## 2017-01-15 DIAGNOSIS — L03115 Cellulitis of right lower limb: Secondary | ICD-10-CM | POA: Diagnosis not present

## 2017-01-15 DIAGNOSIS — Z79899 Other long term (current) drug therapy: Secondary | ICD-10-CM

## 2017-01-15 DIAGNOSIS — Z993 Dependence on wheelchair: Secondary | ICD-10-CM

## 2017-01-15 DIAGNOSIS — Z888 Allergy status to other drugs, medicaments and biological substances status: Secondary | ICD-10-CM

## 2017-01-15 DIAGNOSIS — E11319 Type 2 diabetes mellitus with unspecified diabetic retinopathy without macular edema: Secondary | ICD-10-CM

## 2017-01-15 DIAGNOSIS — E1142 Type 2 diabetes mellitus with diabetic polyneuropathy: Secondary | ICD-10-CM | POA: Diagnosis not present

## 2017-01-15 DIAGNOSIS — M797 Fibromyalgia: Secondary | ICD-10-CM | POA: Diagnosis not present

## 2017-01-15 DIAGNOSIS — E1152 Type 2 diabetes mellitus with diabetic peripheral angiopathy with gangrene: Secondary | ICD-10-CM | POA: Diagnosis not present

## 2017-01-15 DIAGNOSIS — E1169 Type 2 diabetes mellitus with other specified complication: Secondary | ICD-10-CM | POA: Diagnosis not present

## 2017-01-15 DIAGNOSIS — Z825 Family history of asthma and other chronic lower respiratory diseases: Secondary | ICD-10-CM

## 2017-01-15 DIAGNOSIS — I5032 Chronic diastolic (congestive) heart failure: Secondary | ICD-10-CM | POA: Diagnosis not present

## 2017-01-15 DIAGNOSIS — M86671 Other chronic osteomyelitis, right ankle and foot: Secondary | ICD-10-CM

## 2017-01-15 DIAGNOSIS — E669 Obesity, unspecified: Secondary | ICD-10-CM | POA: Diagnosis present

## 2017-01-15 DIAGNOSIS — F329 Major depressive disorder, single episode, unspecified: Secondary | ICD-10-CM

## 2017-01-15 DIAGNOSIS — E1165 Type 2 diabetes mellitus with hyperglycemia: Secondary | ICD-10-CM

## 2017-01-15 DIAGNOSIS — Z6841 Body Mass Index (BMI) 40.0 and over, adult: Secondary | ICD-10-CM

## 2017-01-15 DIAGNOSIS — E08319 Diabetes mellitus due to underlying condition with unspecified diabetic retinopathy without macular edema: Secondary | ICD-10-CM | POA: Diagnosis present

## 2017-01-15 DIAGNOSIS — Z885 Allergy status to narcotic agent status: Secondary | ICD-10-CM

## 2017-01-15 DIAGNOSIS — R262 Difficulty in walking, not elsewhere classified: Secondary | ICD-10-CM

## 2017-01-15 DIAGNOSIS — Z89512 Acquired absence of left leg below knee: Secondary | ICD-10-CM

## 2017-01-15 DIAGNOSIS — Z89511 Acquired absence of right leg below knee: Secondary | ICD-10-CM

## 2017-01-15 DIAGNOSIS — F17210 Nicotine dependence, cigarettes, uncomplicated: Secondary | ICD-10-CM

## 2017-01-15 DIAGNOSIS — IMO0002 Reserved for concepts with insufficient information to code with codable children: Secondary | ICD-10-CM

## 2017-01-15 DIAGNOSIS — Z6835 Body mass index (BMI) 35.0-35.9, adult: Secondary | ICD-10-CM | POA: Diagnosis present

## 2017-01-15 DIAGNOSIS — Z79891 Long term (current) use of opiate analgesic: Secondary | ICD-10-CM

## 2017-01-15 DIAGNOSIS — K219 Gastro-esophageal reflux disease without esophagitis: Secondary | ICD-10-CM

## 2017-01-15 DIAGNOSIS — Z9889 Other specified postprocedural states: Secondary | ICD-10-CM

## 2017-01-15 DIAGNOSIS — M7731 Calcaneal spur, right foot: Secondary | ICD-10-CM | POA: Diagnosis not present

## 2017-01-15 HISTORY — PX: AMPUTATION TOE: SHX6595

## 2017-01-15 LAB — GLUCOSE, CAPILLARY
Glucose-Capillary: 161 mg/dL — ABNORMAL HIGH (ref 65–99)
Glucose-Capillary: 157 mg/dL — ABNORMAL HIGH (ref 65–99)
Glucose-Capillary: 180 mg/dL — ABNORMAL HIGH (ref 65–99)

## 2017-01-15 LAB — URINE CULTURE

## 2017-01-15 LAB — CLOSTRIDIUM DIFFICILE EIA: C difficile Toxins A+B, EIA: NEGATIVE

## 2017-01-15 LAB — CBG MONITORING, ED: Glucose-Capillary: 215 mg/dL — ABNORMAL HIGH (ref 65–99)

## 2017-01-15 SURGERY — AMPUTATION, TOE
Anesthesia: Monitor Anesthesia Care | Site: Toe | Laterality: Right

## 2017-01-15 MED ORDER — IPRATROPIUM-ALBUTEROL 0.5-2.5 (3) MG/3ML IN SOLN
3.0000 mL | Freq: Four times a day (QID) | RESPIRATORY_TRACT | Status: DC | PRN
Start: 2017-01-15 — End: 2017-01-16

## 2017-01-15 MED ORDER — LIDOCAINE 2% (20 MG/ML) 5 ML SYRINGE
INTRAMUSCULAR | Status: AC
  Filled 2017-01-15: qty 5

## 2017-01-15 MED ORDER — INSULIN GLARGINE 100 UNIT/ML ~~LOC~~ SOLN
10.0000 [IU] | Freq: Every day | SUBCUTANEOUS | Status: DC
  Administered 2017-01-16: 10 [IU] via SUBCUTANEOUS
  Filled 2017-01-15 (×2): qty 0.1

## 2017-01-15 MED ORDER — ONDANSETRON HCL 4 MG/2ML IJ SOLN
INTRAMUSCULAR | Status: AC
  Filled 2017-01-15: qty 4

## 2017-01-15 MED ORDER — FENTANYL CITRATE (PF) 100 MCG/2ML IJ SOLN
INTRAMUSCULAR | Status: AC
  Filled 2017-01-15: qty 2

## 2017-01-15 MED ORDER — ACETAMINOPHEN 650 MG RE SUPP: 650.0000 mg | Freq: Four times a day (QID) | RECTAL | Status: DC | PRN

## 2017-01-15 MED ORDER — LIDOCAINE HCL 2 % IJ SOLN
INTRAMUSCULAR | Status: DC | PRN
  Administered 2017-01-15: 5 mL

## 2017-01-15 MED ORDER — ONDANSETRON HCL 4 MG/2ML IJ SOLN: 4.0000 mg | Freq: Once | INTRAMUSCULAR | Status: DC | PRN

## 2017-01-15 MED ORDER — 0.9 % SODIUM CHLORIDE (POUR BTL) OPTIME
TOPICAL | Status: DC | PRN
  Administered 2017-01-15: 1000 mL

## 2017-01-15 MED ORDER — LIDOCAINE HCL 2 % IJ SOLN
INTRAMUSCULAR | Status: AC
  Filled 2017-01-15: qty 20

## 2017-01-15 MED ORDER — ROSUVASTATIN CALCIUM 10 MG PO TABS
20.0000 mg | ORAL_TABLET | Freq: Every day | ORAL | Status: DC
  Administered 2017-01-15: 20 mg via ORAL
  Filled 2017-01-15: qty 2
  Filled 2017-01-15: qty 1

## 2017-01-15 MED ORDER — HYDROMORPHONE HCL 1 MG/ML IJ SOLN: 0.2500 mg | INTRAMUSCULAR | Status: DC | PRN

## 2017-01-15 MED ORDER — OXYCODONE HCL 5 MG/5ML PO SOLN: 5.0000 mg | Freq: Once | ORAL | Status: DC | PRN

## 2017-01-15 MED ORDER — VANCOMYCIN HCL 1000 MG IV SOLR
INTRAVENOUS | Status: DC | PRN
  Administered 2017-01-15: 2000 mg via INTRAVENOUS

## 2017-01-15 MED ORDER — INSULIN ASPART 100 UNIT/ML ~~LOC~~ SOLN
0.0000 [IU] | Freq: Three times a day (TID) | SUBCUTANEOUS | Status: DC
  Administered 2017-01-16 (×2): 8 [IU] via SUBCUTANEOUS
  Filled 2017-01-15: qty 1

## 2017-01-15 MED ORDER — NEOSTIGMINE METHYLSULFATE 5 MG/5ML IV SOSY
PREFILLED_SYRINGE | INTRAVENOUS | Status: AC
  Filled 2017-01-15: qty 5

## 2017-01-15 MED ORDER — OXYCODONE-ACETAMINOPHEN 5-325 MG PO TABS
1.0000 | ORAL_TABLET | ORAL | Status: DC | PRN
  Administered 2017-01-15 – 2017-01-16 (×4): 2 via ORAL
  Filled 2017-01-15 (×4): qty 2

## 2017-01-15 MED ORDER — LIDOCAINE 2% (20 MG/ML) 5 ML SYRINGE
INTRAMUSCULAR | Status: DC | PRN
  Administered 2017-01-15: 100 mg via INTRAVENOUS

## 2017-01-15 MED ORDER — VANCOMYCIN HCL IN DEXTROSE 1-5 GM/200ML-% IV SOLN
INTRAVENOUS | Status: AC
  Filled 2017-01-15: qty 200

## 2017-01-15 MED ORDER — BUPIVACAINE HCL (PF) 0.5 % IJ SOLN
INTRAMUSCULAR | Status: DC | PRN
  Administered 2017-01-15: 5 mL

## 2017-01-15 MED ORDER — MIDAZOLAM HCL 5 MG/5ML IJ SOLN
INTRAMUSCULAR | Status: DC | PRN
  Administered 2017-01-15: 2 mg via INTRAVENOUS

## 2017-01-15 MED ORDER — PROPOFOL 10 MG/ML IV BOLUS
INTRAVENOUS | Status: DC | PRN
  Administered 2017-01-15: 20 mg via INTRAVENOUS

## 2017-01-15 MED ORDER — ACETAMINOPHEN 325 MG PO TABS: 650.0000 mg | ORAL_TABLET | Freq: Four times a day (QID) | ORAL | Status: DC | PRN

## 2017-01-15 MED ORDER — MIDAZOLAM HCL 2 MG/2ML IJ SOLN
INTRAMUSCULAR | Status: AC
  Filled 2017-01-15: qty 2

## 2017-01-15 MED ORDER — VANCOMYCIN HCL 1000 MG IV SOLR
INTRAVENOUS | Status: AC
  Filled 2017-01-15: qty 1000

## 2017-01-15 MED ORDER — PROMETHAZINE HCL 25 MG/ML IJ SOLN: 6.2500 mg | INTRAMUSCULAR | Status: DC | PRN

## 2017-01-15 MED ORDER — ROCURONIUM BROMIDE 50 MG/5ML IV SOSY
PREFILLED_SYRINGE | INTRAVENOUS | Status: AC
  Filled 2017-01-15: qty 5

## 2017-01-15 MED ORDER — LACTATED RINGERS IV SOLN
INTRAVENOUS | Status: DC
  Administered 2017-01-15 (×2): via INTRAVENOUS

## 2017-01-15 MED ORDER — SODIUM CHLORIDE 0.9 % IV SOLN
INTRAVENOUS | Status: AC
  Filled 2017-01-15: qty 1000

## 2017-01-15 MED ORDER — PROPOFOL 10 MG/ML IV BOLUS
INTRAVENOUS | Status: AC
  Filled 2017-01-15: qty 20

## 2017-01-15 MED ORDER — MOMETASONE FURO-FORMOTEROL FUM 200-5 MCG/ACT IN AERO
2.0000 | INHALATION_SPRAY | Freq: Two times a day (BID) | RESPIRATORY_TRACT | Status: DC
  Filled 2017-01-15 (×3): qty 8.8

## 2017-01-15 MED ORDER — BUPIVACAINE HCL (PF) 0.5 % IJ SOLN
INTRAMUSCULAR | Status: AC
  Filled 2017-01-15: qty 10

## 2017-01-15 MED ORDER — FENTANYL CITRATE (PF) 100 MCG/2ML IJ SOLN
25.0000 ug | INTRAMUSCULAR | Status: DC | PRN
  Administered 2017-01-15: 50 ug via INTRAVENOUS

## 2017-01-15 MED ORDER — OXYCODONE HCL 5 MG PO TABS: 5.0000 mg | ORAL_TABLET | Freq: Once | ORAL | Status: DC | PRN

## 2017-01-15 MED ORDER — PROPOFOL 500 MG/50ML IV EMUL
INTRAVENOUS | Status: DC | PRN
  Administered 2017-01-15: 50 ug/kg/min via INTRAVENOUS

## 2017-01-15 MED ORDER — LOPERAMIDE HCL 2 MG PO CAPS: 2.0000 mg | ORAL_CAPSULE | ORAL | Status: DC | PRN

## 2017-01-15 MED ORDER — MEPERIDINE HCL 25 MG/ML IJ SOLN: 6.2500 mg | INTRAMUSCULAR | Status: DC | PRN

## 2017-01-15 SURGICAL SUPPLY — 38 items
BANDAGE ACE 4X5 VEL STRL LF (GAUZE/BANDAGES/DRESSINGS) ×2 IMPLANT
BLADE LONG MED 31X9 (MISCELLANEOUS) ×1 IMPLANT
BLADE SURG 15 STRL LF DISP TIS (BLADE) IMPLANT
BLADE SURG 15 STRL SS (BLADE) ×2
BNDG CMPR 9X4 STRL LF SNTH (GAUZE/BANDAGES/DRESSINGS) ×1
BNDG ESMARK 4X9 LF (GAUZE/BANDAGES/DRESSINGS) ×1 IMPLANT
BNDG GAUZE ELAST 4 BULKY (GAUZE/BANDAGES/DRESSINGS) ×2 IMPLANT
COVER SURGICAL LIGHT HANDLE (MISCELLANEOUS) ×3 IMPLANT
CUFF TOURNIQUET SINGLE 24IN (TOURNIQUET CUFF) ×1 IMPLANT
DRAPE U-SHAPE 47X51 STRL (DRAPES) ×2 IMPLANT
DURAPREP 26ML APPLICATOR (WOUND CARE) ×2 IMPLANT
ELECT REM PT RETURN 9FT ADLT (ELECTROSURGICAL) ×2
ELECTRODE REM PT RTRN 9FT ADLT (ELECTROSURGICAL) ×1 IMPLANT
GAUZE SPONGE 4X4 12PLY STRL (GAUZE/BANDAGES/DRESSINGS) ×2 IMPLANT
GAUZE XEROFORM 1X8 LF (GAUZE/BANDAGES/DRESSINGS) ×2 IMPLANT
GLOVE BIO SURGEON STRL SZ8 (GLOVE) ×1 IMPLANT
GLOVE BIOGEL PI IND STRL 6.5 (GLOVE) IMPLANT
GLOVE BIOGEL PI IND STRL 8 (GLOVE) IMPLANT
GLOVE BIOGEL PI INDICATOR 6.5 (GLOVE) ×1
GLOVE BIOGEL PI INDICATOR 8 (GLOVE) ×1
GLOVE ECLIPSE 6.5 STRL STRAW (GLOVE) ×1 IMPLANT
GLOVE SURG SS PI 6.5 STRL IVOR (GLOVE) ×1 IMPLANT
GOWN STRL REUS W/ TWL LRG LVL3 (GOWN DISPOSABLE) IMPLANT
GOWN STRL REUS W/ TWL XL LVL3 (GOWN DISPOSABLE) ×2 IMPLANT
GOWN STRL REUS W/TWL LRG LVL3 (GOWN DISPOSABLE) ×2
GOWN STRL REUS W/TWL XL LVL3 (GOWN DISPOSABLE) ×2
KIT BASIN OR (CUSTOM PROCEDURE TRAY) ×2 IMPLANT
KIT ROOM TURNOVER OR (KITS) ×2 IMPLANT
NS IRRIG 1000ML POUR BTL (IV SOLUTION) ×2 IMPLANT
PACK ORTHO EXTREMITY (CUSTOM PROCEDURE TRAY) ×2 IMPLANT
PAD ARMBOARD 7.5X6 YLW CONV (MISCELLANEOUS) ×3 IMPLANT
PAD CAST 3X4 CTTN HI CHSV (CAST SUPPLIES) IMPLANT
PADDING CAST COTTON 3X4 STRL (CAST SUPPLIES) ×2
SOLUTION BETADINE 4OZ (MISCELLANEOUS) ×1 IMPLANT
SUT PROLENE 4 0 PS 2 18 (SUTURE) ×2 IMPLANT
SWAB COLLECTION DEVICE MRSA (MISCELLANEOUS) ×1 IMPLANT
SWAB CULTURE ESWAB REG 1ML (MISCELLANEOUS) ×1 IMPLANT
TOWEL OR 17X26 10 PK STRL BLUE (TOWEL DISPOSABLE) ×2 IMPLANT

## 2017-01-15 NOTE — ED Provider Notes (Signed)
By signing my name below, I, [name], attest that this documentation has been prepared under the direction and in the presence of [name], DO . Electronically Signed: [name], Scribe. 01/15/2017. 1:47 AM.  TIME SEEN: 1:48AM  CHIEF COMPLAINT: Pre-Op Exam  HPI:  Jennifer [name] is a 56 y.o. female with pmhx of Osteomyelitis who presents to the Emergency Department for an exam prior to a right fifth toe amputation scheduled for tomorrow morning. She reports associated wound, pain and swelling to her right fifth toe. Her pain is worsened with palpation. She denies any fever, CP, SOB, vomiting or diarrhea. Pt is scheduled for surgery with Dr. Amalia Hailey with podiatry tomorrow.   ROS: See HPI Constitutional: no fever  Eyes: no drainage  ENT: no runny nose   Cardiovascular:  no chest pain  Resp: no SOB  GI: no vomiting, no diarrhea GU: no dysuria Integumentary: wound. no rash  Allergy: no hives  Musculoskeletal: arthralgia (right fifth toe), no leg swelling  Neurological: no slurred speech ROS otherwise negative  PAST MEDICAL HISTORY/PAST SURGICAL HISTORY:  Past Medical History:  Diagnosis Date  . Anginal pain [facility])    '3' of 10 ischemia ruled out 9/9   . 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 Asthma   . CHF (congestive heart failure) ([place])   . 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 Chronic bronchitis ([place])    "I get it alot" (09/28/2013)  . Chronic diastolic heart failure (HCC)    grade 2 per 2D echocardiogram (01/2013)  . Chronic lower back pain   . Chronic osteomyelitis of foot (HCC)    chronic, right secondary to diabetic foot ulcers  . Chronic pain syndrome 12/03/2011   Likely secondary to depression, "fibromyalgia", neuropathy, and obesity. Lumbar MRI 2014 no sig change from prior (2008) : Stable hypertrophic facet disease most notable at L4-5. Stable shallow left foraminal/extraforaminal disc protrusion at L4-5. No direct neural compression.       Marland Kitchen COPD 01/08/2007   PFT's 05/2007 : FEV1/FVC 82, FEV1 64% pred, FEF 25-75% 40% predicted, 16% improvement in FEV1 with bronchodilators.     . Depression   . Diabetic peripheral neuropathy ([place])   . DVT of upper extremity (deep vein thrombosis) ([place]) 03/11/2013   Secondary to PICC line. Right brachial vein, diagnosed on 03/10/2013 Coumadin for 3 months. End date 06/10/2013   . Environmental allergies    Hx: of  . Exertional shortness of breath   . Fatty liver 2003   observed on ultrasound abdomen  . Fibromyalgia   . GERD (gastroesophageal reflux disease)   . Glaucoma   . Hyperlipidemia   . Hyperplastic colon polyp 12/2010   Per colonoscopy (12/2010) - Dr. Deatra Ina  . Hypertension   . 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 Blood cultures 01/26/13 S. Aureus and GBS. Blood cultures Feb 6th, 8th, and 9th and March were negative.Repeat TEE 3/20 negative for vegitations  . 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 2/2 tobacco use, DM, and cont weight bearing on surgical wound and developed gangrene   . Polymicrobial bacterial infection 01/2013   GBS and S. aureus bacteremia // Source likely infected diabetic foot ulcer  . PVD (peripheral vascular disease) with claudication ([place])    Stents to bilateral common iliac arteries (left 2005, right 2008), on chronic plavix  .  Rheumatoid arthritis ([place])   . Tobacco abuse   . Type II diabetes mellitus with peripheral circulatory disorders, uncontrolled DX: 1993   Insulin dep. Poor control. Complicated by diabetic foot ulcer and diabetic eye disease.    Marland Kitchen Ulcer of foot, chronic (HCC)    Left. No OM per MRI (01/2013)    MEDICATIONS:  Prior to Admission medications   Medication Sig Start Date End Date Taking? Authorizing Provider  ADVAIR DISKUS 250-50 MCG/DOSE AEPB TAKE 1 INHALATION BY MOUTH TWICE DAILY Patient not taking:  Reported on 01/07/2017 09/03/16   Bartholomew Crews, MD  albuterol (PROVENTIL) (2.5 MG/3ML) 0.083% nebulizer solution INHALE THE CONTENTS OF 1 VIAL VIA NEBULIZER EVERY 6 HOURS AS NEEDED FOR WHEEZING Patient not taking: Reported on 01/07/2017 12/03/16   Axel Filler, MD  amLODipine (NORVASC) 10 MG tablet TAKE 1 TABLET BY MOUTH EVERY DAY Patient not taking: Reported on 01/07/2017 10/01/16   Bartholomew Crews, MD  atenolol (TENORMIN) 100 MG tablet Take 1 tablet (100 mg total) by mouth daily. Patient not taking: Reported on 01/07/2017 07/23/16   Bartholomew Crews, MD  baclofen (LIORESAL) 10 MG tablet Take 1 tablet (10 mg total) by mouth 3 (three) times daily as needed for muscle spasms. Reported on 06/27/2016 Patient not taking: Reported on 01/07/2017 07/23/16   Bartholomew Crews, MD  benazepril (LOTENSIN) 40 MG tablet Take 40 mg by mouth daily. 11/26/16   Historical Provider, MD  [place] X 8 MM MISC USE TO INJECT INSULIN TWICE DAILY Patient not taking: Reported on 01/07/2017 09/03/16   Bartholomew Crews, MD  furosemide (LASIX) 40 MG tablet Take 1 tablet (40 mg total) by mouth daily as needed. Patient not taking: Reported on 01/07/2017 07/23/16   Bartholomew Crews, MD  hydrochlorothiazide (HYDRODIURIL) 25 MG tablet Take 1 tablet (25 mg total) by mouth daily. Patient not taking: Reported on 01/07/2017 07/23/16   Bartholomew Crews, MD  Insulin Lispro Prot & Lispro (HUMALOG MIX 75/25 KWIKPEN) (75-25) 100 UNIT/ML Kwikpen Inject 140 units subcutaneously before breakfast and 85 units before supper Patient not taking: Reported on 01/07/2017 12/03/16   Axel Filler, MD  ipratropium (ATROVENT) 0.02 % nebulizer solution USE 1 VIAL VIA NEBULIZER EVERY 6 HOURS AS NEEDED FOR WHEEZING Patient not taking: Reported on 01/07/2017 09/26/16   Shela Leff, MD  loperamide (IMODIUM) 2 MG capsule Take 1 capsule (2 mg total) by mouth as needed for diarrhea or loose stools. 01/12/17   Minus Liberty, MD  LYRICA 200 MG capsule TAKE 1 CAPSULE BY MOUTH THREE TIMES A DAY Patient not taking: Reported on 01/07/2017 12/31/16   Bartholomew Crews, MD  metFORMIN (GLUCOPHAGE XR) 500 MG 24 hr tablet Take 1 tablet (500 mg total) by mouth daily with breakfast. Patient not taking: Reported on 01/07/2017 06/30/16 06/30/17  Holley Raring, MD  nystatin (MYCOSTATIN) powder APPLY TO AFFECTED AREA THREE TIMES A DAY Patient not taking: Reported on 01/07/2017 05/26/16   Bartholomew Crews, MD  [facility] DELICA LANCETS [id number] [place] USE TO Ruth BLOOD SUGARS FOUR TIMES A DAY Patient not taking: Reported on 01/07/2017 05/29/16   Bartholomew Crews, MD  [facility] VERIO test strip USE 3-4 TIMES DAILY TO CHECK BLOOD SUGAR Patient not taking: Reported on 01/07/2017 10/01/16   Bartholomew Crews, MD  oxyCODONE-acetaminophen (PERCOCET/ROXICET) 5-325 MG tablet Take 1-2 tablets by mouth every 4 (four) hours as needed for severe pain. 01/01/17   Bartholomew Crews, MD  pantoprazole (  PROTONIX) 40 MG tablet TAKE 1 TABLET (40 MG TOTAL) BY MOUTH DAILY. Patient not taking: Reported on 01/07/2017 12/23/16 12/23/17  Bartholomew Crews, MD  phenazopyridine (PYRIDIUM) 200 MG tablet Take 1 tablet (200 mg total) by mouth 3 (three) times daily as needed for pain. 01/13/17 01/13/18  Bartholomew Crews, MD  potassium chloride SA (K-DUR,KLOR-CON) 20 MEQ tablet TAKE 2 TABLETS BY MOUTH DAILY Patient not taking: Reported on 01/07/2017 05/26/16   Bartholomew Crews, MD  PROAIR HFA 108 [phone] Base) MCG/ACT inhaler INHALE 2 PUFFS BY MOUTH EVERY 6 HOURS AS NEEDED FOR WHEEZING Patient not taking: Reported on 01/07/2017 11/12/16   Bartholomew Crews, MD  ranitidine (ZANTAC) 150 MG tablet Take 1 tablet (150 mg total) by mouth 2 (two) times daily. Patient not taking: Reported on 01/07/2017 07/23/16   Bartholomew Crews, MD  rosuvastatin (CRESTOR) 20 MG tablet Take 20 mg by mouth at bedtime. 11/26/16   Historical Provider, MD  sertraline (ZOLOFT) 100 MG tablet Take  1 tablet (100 mg total) by mouth daily. Patient not taking: Reported on 01/07/2017 05/26/16   Bartholomew Crews, MD  traZODone (DESYREL) 100 MG tablet TAKE 1 TABLET BY MOUTH DAILY AT BEDTIME AS NEEDED FOR SLEEP Patient not taking: Reported on 01/07/2017 07/23/16   Bartholomew Crews, MD    ALLERGIES:  Allergies  Allergen Reactions  . Abilify [Aripiprazole] Other (See Comments)    Urinary freq Nov 2016  . Iohexol      Desc: IV CONTRAST CAUSE NEPHROPATHY IN 2007   . Ivp Dye [Iodinated Diagnostic Agents]   . Morphine Sulfate Itching and Rash    SOCIAL HISTORY:  Social History  Substance Use Topics  . Smoking status: Current Every Day Smoker    Packs/day: 0.50    Years: 44.00    Types: Cigarettes  . Smokeless tobacco: Never Used     Comment: 1PPD  . Alcohol use No    FAMILY HISTORY: Family History  Problem Relation Age of Onset  . Diverticulosis Mother   . Diabetes Mother   . Hypertension Mother   . Congestive Heart Failure Mother   . Asthma Father   . CAD Sister 32    MI at age 59 per patient.  However, she has not had a stent or CABG.   . Heart disease Sister     before age 103    EXAM: BP 134/75 (BP Location: Right Wrist)   Pulse 62   Temp 98.1 F (36.7 C) (Oral)   Resp 16   SpO2 95%  CONSTITUTIONAL: Alert and oriented and responds appropriately to questions. Chronically ill-appearing; well-nourished; obese HEAD: Normocephalic EYES: Conjunctivae clear, PERRL, EOMI ENT: normal nose; no rhinorrhea; moist mucous membranes NECK: Supple, no meningismus, no nuchal rigidity, no LAD  CARD: RRR; S1 and S2 appreciated; no murmurs, no clicks, no rubs, no gallops RESP: Normal chest excursion without splinting or tachypnea; breath sounds clear and equal bilaterally; no wheezes, no rhonchi, no rales, no hypoxia or respiratory distress, speaking full sentences ABD/GI: Normal bowel sounds; non-distended; soft, non-tender, no rebound, no guarding, no peritoneal signs, no  hepatosplenomegaly BACK:  The back appears normal and is non-tender to palpation, there is no CVA tenderness EXT: Normal ROM in all joints; non-tender to palpation; no edema; normal capillary refill; no cyanosis, no calf tenderness or swelling; Small open wound to lateral right fifth toe with purulent drainage, no erythema or warmth, no fluctuance or induration; 2+ DP pulse on right.  SKIN: Normal  color for age and race; warm; no rash NEURO: Moves all extremities equally, sensation to light touch intact diffusely, cranial nerves II through XII intact, normal speech PSYCH: The patient's mood and manner are appropriate. Grooming and personal hygiene are appropriate.  MEDICAL DECISION MAKING: Patient here with osteomyelitis of the right fifth toe. Was sent here by her podiatrist for admission as she was supposed to have a amputation tomorrow. She has no emergent complaints. Labs, chest x-ray, EKG showed no acute abnormality. We'll discuss with internal medicine as Dr. [name] is her PCP.  ED PROGRESS:    EKG Interpretation  Date/Time:  Wednesday January 14 2017 21:25:46 EST Ventricular Rate:  62 PR Interval:  168 QRS Duration: 74 QT Interval:  416 QTC Calculation: 422 R Axis:   17 Text Interpretation:  Sinus rhythm with Premature atrial complexes in a pattern of bigeminy Low voltage QRS Cannot rule out Anterior infarct , age undetermined Abnormal ECG No significant change since last tracing Confirmed by Jayci Ellefson,  DO, Donnis Phaneuf ([id number]) on 01/15/2017 1:46:47 AM      1:58 AM Spoke to [facility] Internal Medicine resident who recommends admission to medical bed inpatient under Dr. [place].  I will place holding orders per their request. I have left a message with Dr. Amalia Hailey to confirm plan.  6:30 AM  Dr. Amalia Hailey Called back. Instructed him that the patient had already been admitted and was no longer in the emergency department. He states that he would like patient to be NPO after breakfast and he plans to  take her to surgery after 5 PM.    I reviewed all nursing notes, vitals, pertinent old records, EKGs, labs, imaging (as available).   I personally performed the services described in this documentation, which was scribed in my presence. The recorded information has been reviewed and is accurate.     [place], DO 01/15/17 [phone]

## 2017-01-15 NOTE — Transfer of Care (Signed)
Immediate Anesthesia Transfer of Care Note  Patient: Jennifer [name]  Procedure(s) Performed: Procedure(s): AMPUTATION 5th TOE RIGHT FOOT (Right)  Patient Location: PACU  Anesthesia Type:MAC  Level of Consciousness: awake, alert  and oriented  Airway & Oxygen Therapy: Patient Spontanous Breathing and Patient connected to nasal cannula oxygen  Post-op Assessment: Report given to RN and Post -op Vital signs reviewed and stable  Post vital signs: Reviewed and stable  Last Vitals:  Vitals:   01/15/17 0300 01/15/17 0345  BP: 133/71 136/72  Pulse: 67 77  Resp: 16 18  Temp:      Last Pain:  Vitals:   01/15/17 0142  TempSrc:   PainSc: 9          Complications: No apparent anesthesia complications

## 2017-01-15 NOTE — Progress Notes (Signed)
   Subjective:  56 year old female with a history of diabetes mellitus and left below-knee amputation presents to the office today as a new patient.  Patient was referred by Dr. Dellia Nims at the [facility] wound care center for evaluation of osteomyelitis to the fifth digit right foot. Patient states that she has been treated for several months regarding the fifth digit to her right foot.  Patient has a history of left below-knee amputation secondary to diabetes mellitus.    Objective/Physical Exam General: The patient is alert and oriented x3 in no acute distress.  Dermatology: open draining sinus noted to the fifth digit right foot with ulceration measuring approximately [phone] cm (LxWxD).  To the noted ulceration,there is exposed bone.  There is localized cellulitis regarding the fifth digit right foot.  Wound appears chronic in nature. Negative for malodor.  Vascular: Diminished pedal pulses to the right lower extremity.  Below-knee amputation left lower extremity.  Neurological: Epicritic and protective threshold absent  Musculoskeletal Exam: left below-knee amputation, stable. History of amputation or significant deformity right lower extremity   Radiographic Exam:  Radiographic exam taken on November 28, 2016 indicate possible osteolytic process with periosteal reaction fifth digit right foot.    MRI of the right foot performed on December 26, 2016 : IMPRESSION: 1. Osteomyelitis involving the proximal and middle phalanges of the small toe, likely with septic proximal interphalangeal joint containing gas and fluid.Bony destruction of the head of the proximal phalanx. Surrounding soft tissue edema with mild extension posteriorly into the dorsum of the foot.  Assessment: #1 chronic osteomyelitis with acute cellulitis fifth digit right foot #2 history of below-knee amputation left lower extremity #3 diabetes mellitus /with peripheral neuroarthropathy   Plan of Care:  #1 Patient was  evaluated. #2 today I had a very detailed discussion with the patient regarding necessity of fifth toe amputation right foot.  Patient understands that she needs the surgery to prevent further infection and more proximal amputation. #3 I originally recommended outpatient surgical amputation, however, the patient was concerned for outpatient surgery and prefers inpatient surgical amputation.  Given the complicated medical history and below-knee amputation to the left lower extremity, I consented. #4 recommend admission through the emergency department at [facility] and scheduled for add-on surgery tentatively for 01/15/2017 after 5pm. Surgery will consist of fifth digit amputation right foot with incision and drainage right foot. #5 return to clinic one week postop   Edrick Kins, DPM [facility]  Dr. Edrick Kins, [address]                                        [place], [place] [address]                Office [phone]  Fax [phone]

## 2017-01-15 NOTE — Patient Outreach (Signed)
[facility]) Care Management  01/15/17  Jennifer [name] 06/23/1961  [id number]  Attempted to reach patient without success for transition of care. Phone rang, but voicemail box was full and RNCM was unable to leave a message.  Per review, patient went into the emergency department last night for "pre-op clearance" and has been admitted today for amputation of right 5th toe. Surgery is scheduled for this afternoon with Dr. Daylene Katayama ([facility]).   Anticipated discharge in approximately 1-2 day(s). Will attempt to reach patient following discharge.  [place] R. Adley Mazurowski, RN, BSN, [place] Management Coordinator [phone]

## 2017-01-15 NOTE — ED Notes (Signed)
Patient asking for water.  Okay'd patient to drink.

## 2017-01-15 NOTE — Brief Op Note (Signed)
01/15/2017  6:51 PM  PATIENT:  Jennifer [name]  56 y.o. female  PRE-OPERATIVE DIAGNOSIS:  Osteomyelitis   POST-OPERATIVE DIAGNOSIS:  Osteomyelitis   PROCEDURE:  Procedure(s): AMPUTATION 5th TOE RIGHT FOOT (Right)  Incision and drainage right foot Partial fifth ray amputation right foot  SURGEON:  Surgeon(s) and Role:    * Edrick Kins, DPM - Primary  PHYSICIAN ASSISTANT:   ASSISTANTS: none   ANESTHESIA:   local and MAC  EBL:  No intake/output data recorded.  BLOOD ADMINISTERED:none  DRAINS: none   LOCAL MEDICATIONS USED:  BUPIVICAINE  and XYLOCAINE   SPECIMEN:  Source of Specimen:  Deep wound culture right foot  DISPOSITION OF SPECIMEN:  N/A  COUNTS:  YES  TOURNIQUET:   Total Tourniquet Time Documented: Calf (Right) - 25 minutes Total: Calf (Right) - 25 minutes   DICTATION: .Viviann Spare Dictation  PLAN OF CARE: Admit to inpatient  PATIENT DISPOSITION:  PACU - hemodynamically stable.   Delay start of Pharmacological VTE agent (>24hrs) due to surgical blood loss or risk of bleeding: not applicable  Edrick Kins, DPM [facility]  Dr. Edrick Kins, [address]                                        [place], [place] [address]                Office [phone]  Fax [phone]

## 2017-01-15 NOTE — ED Notes (Signed)
This RN called Dr. Amalia Hailey for plan of pt's surgery today. Per Dr. Amalia Hailey, pt will have a toe amputation tonight after 5pm. Anesthesiology to see this afternoon

## 2017-01-15 NOTE — ED Notes (Signed)
ED Provider at bedside. 

## 2017-01-15 NOTE — H&P (Signed)
Date: 01/15/2017               Patient Name:  Jennifer [name] MRN: [id number]  DOB: 12/25/1960 Age / Sex: 56 y.o., female   PCP: Bartholomew Crews, MD         Medical Service: Internal Medicine Teaching Service         Attending Physician: Dr. Lucious Groves, DO    First Contact: Dr. Inda Castle Pager: [phone]  Second Contact: Dr. 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 Amalia Hailey with podiatry.   Patient has had a chronic wound to right fifth toe that has been managed by wound care clinic. Patient had a recent MRI of her right foot through [facility] which revealed osteomyelitis of proxima and middle phalanges of the fifth digit with septic PIP joint and bony destruction of the head of the proximal phalanx. Patient followed up with Podiatry for surgical consult following a hospitalization (during which time she received antibiotics) for encephalopathy thought to be due to possible polypharmacy as no other causes were excluded.   Patient states that her toe has been becoming more painful and has been draining purulent fluid but not recently. She states her whole foot is tender (which she attributes to fibromyalgia) but denies spreading erythema. She denies fevers, chills, chest pain, shortness of breath at rest or with exertion. Ability to exert herself is very limited, however as patient has chronic back pain, fibromyalgia, and left BKA. Patient is able to basic chores around the house and vacuum while in a wheelchair.   Meds:  Current Meds  Medication  Sig  . benazepril (LOTENSIN) 40 MG tablet Take 40 mg by mouth daily.  Marland Kitchen loperamide (IMODIUM) 2 MG capsule Take 1 capsule (2 mg total) by mouth as needed for diarrhea or loose stools.  Marland Kitchen oxyCODONE-acetaminophen (PERCOCET/ROXICET) 5-325 MG tablet Take 1-2 tablets by mouth every 4 (four) hours as needed for severe pain.  . phenazopyridine (PYRIDIUM) 200 MG tablet Take 1 tablet (200 mg total) by mouth 3 (three) times daily as needed for pain.  . rosuvastatin (CRESTOR) 20 MG tablet Take 20 mg by mouth at bedtime.    Allergies: Allergies as of 01/14/2017 - Review Complete 01/14/2017  Allergen Reaction Noted  . Abilify [aripiprazole] Other (See Comments) 11/09/2015  . Iohexol  08/17/2007  . Ivp dye [iodinated diagnostic agents]  05/10/2013  . Morphine sulfate Itching and Rash    Past Medical History:  Diagnosis Date  . Anginal pain [facility])    '3' of 10 ischemia ruled out 9/9   . 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 Asthma   . CHF (congestive heart failure) ([place])   . 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 Chronic bronchitis ([place])    "I get it alot" (09/28/2013)  . Chronic diastolic heart failure (HCC)    grade 2 per 2D echocardiogram (01/2013)  . Chronic lower back pain   . Chronic osteomyelitis of foot (HCC)    chronic, right secondary  to diabetic foot ulcers  . Chronic pain syndrome 12/03/2011   Likely secondary to depression, "fibromyalgia", neuropathy, and obesity. Lumbar MRI 2014 no sig change from prior (2008) : Stable hypertrophic facet disease most notable at L4-5. Stable shallow left foraminal/extraforaminal disc protrusion at L4-5. No direct neural compression.      Marland Kitchen COPD 01/08/2007   PFT's 05/2007 : FEV1/FVC 82, FEV1 64% pred, FEF 25-75% 40% predicted, 16% improvement in FEV1 with bronchodilators.     . Depression   . Diabetic peripheral neuropathy ([place])   . DVT of upper extremity (deep vein thrombosis) ([place]) 03/11/2013   Secondary to PICC line. Right brachial  vein, diagnosed on 03/10/2013 Coumadin for 3 months. End date 06/10/2013   . Environmental allergies    Hx: of  . Exertional shortness of breath   . Fatty liver 2003   observed on ultrasound abdomen  . Fibromyalgia   . GERD (gastroesophageal reflux disease)   . Glaucoma   . Hyperlipidemia   . Hyperplastic colon polyp 12/2010   Per colonoscopy (12/2010) - Dr. Deatra Ina  . Hypertension   . 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 Blood cultures 01/26/13 S. Aureus and GBS. Blood cultures Feb 6th, 8th, and 9th and March were negative.Repeat TEE 3/20 negative for vegitations  . 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 2/2 tobacco use, DM, and cont weight bearing on surgical wound and developed gangrene   . Polymicrobial bacterial infection 01/2013   GBS and S. aureus bacteremia // Source likely infected diabetic foot ulcer  . PVD (peripheral vascular disease) with claudication ([place])    Stents to bilateral common iliac arteries (left 2005, right 2008), on chronic plavix  . Rheumatoid arthritis ([place])   . Tobacco abuse   . Type II diabetes mellitus with peripheral circulatory disorders, uncontrolled DX: 1993   Insulin dep. Poor control. Complicated by diabetic foot ulcer and diabetic eye disease.    Marland Kitchen Ulcer of foot, chronic (HCC)    Left. No OM per MRI (01/2013)    Family History: Mother with DM, HTN, CHF; Father with Asthma; Sister with MI at age 72 but no stent or CABG  Social History: 0.5 ppd smoker; no alcohol use; history of marijuana and cocaine abuse  Review of Systems: A complete ROS was negative except as per HPI.  Physical Exam: Blood pressure 134/75, pulse 62, temperature 98.1 F (36.7 C), temperature source Oral, resp. rate 16, SpO2 95 %. General: alert, well-developed, and cooperative to examination.  Head: normocephalic and atraumatic.  Eyes: vision  grossly intact, pupils equal, pupils round, pupils reactive to light, no injection and anicteric.  Mouth: pharynx pink and moist, no erythema, and no exudates.  Neck: supple, full ROM, no thyromegaly.  Lungs: normal respiratory effort, no accessory muscle use, normal breath sounds, no crackles, and no wheezes. Decreased breath sounds throughout. Heart: normal rate, regular rhythm, no murmur, no gallop, and no rub. Decreased heart sounds 2/2 body habitus. Abdomen: soft, non-tender, normal bowel sounds, no distention, no guarding, no rebound tenderness.  Msk: no joint swelling, no joint warmth, and no redness over joints. Left BKA. Right 5th toe with dorsolateral necrosis and clearing in center at possible site of prior drainage, not currently draining. Tenderness to palpation of entire foot without increased tenderness of wound. No surrounding erythema.  Pulses: Radial pulses intact bilaterally. Extremities: No cyanosis, clubbing, edema Neurologic: alert &  oriented X3, cranial nerves II-XII intact, strength normal in all extremities, decreased sensation in R foot.  Skin: turgor normal and no rashes.  Psych: Oriented X3, memory intact for recent and remote, normally interactive, good eye contact, not anxious appearing, and not depressed appearing  Labs: WBC 9.7, Hgb 13.9, Hct 43.0, Plts 186 Na 142, K 4.1, Cl 107, CO2 25, BUN 12, Cr 0.75, Glu 70 Alb 3.1, AST 35, ALT 34, Alk phos 59, tot bili 59 iStat trop negative  EKG: Personally reviewed - SR, bigeminy, no acute ST or T wave changes  CXR: Personally reviewed - mid and lower lung zone atelectasis and mild bronchitic changes consistent with COPD  Assessment & Plan by Problem: Active Problems:   Toe osteomyelitis, right (HCC)  Osteomyelitis of right 5th toe: Patient presents with chronic wound to her right fifth toe for which she was sent for admission by podiatry for clearance and possible surgery later today. CBC and CMet are unremarkable  compared to priors. She is not actively having chest pain or shortness of breath - troponin negative and no acute ischemic changes on EKG. CXR reveals mid and lower lung zone atelectasis and mild bronchitic changes consistent with COPD. Last stress test was in 2014 and showed no inducible ischemia. --Patient is Class 4 risk (11% risk of adverse cardiac event post surgery) in setting of insulin dependent DM, chronic diastolic CHF, MRI brain evidence of chronic infarcts.  --Patient's MET is 2-3 which confers a recommendation for cardiac stress test prior to surgery --Oxycodone-acetaminophen 5-368m 1-2 tabs q4hrs PRN (520mhome dose for chronic pain)  Diarrhea: Patient with diarrhea that began during last hospitalization, which at that time was negative for C diff. Patient states that her diarrhea is getting better and is having 3-4 episodes per day without melena or hematochezia. Likely 2/2 antibiotic use. --f/u second C diff sent from clinic  --loperamide PRN  HTN:  Patient with history of uncontrolled hypertension on amlodipine 1074maily, atenolol 100m19mily, HCTZ 25mg43mly, and benazepril 40mg 18my.  --hold home regimen until after procedure if done; resume as needed --BP on evaluation 133/71   Chronic diastolic congestive heart failure: Last ECHO was 08/2013 showing EF 65-70% with G1DD without regional wall abnormalities. Lung exam clear of crackles or increased work of breathing, and she has chronic nonpitting LE edema. CXR does not demonstrate cardiomegaly or congestion. --hold home atenolol 100mg d53m and benazepril 40mg da39mas above --Lasix 40mg dai71mRN  COPD: Patient on home regimen of advair, atrovent and albuterol.   --Duoneb q6hrs --Dulera BID  T2DM On home regimen of Novolog Mix 75/25 140U qAM, 85U qPM and metformin 500mg dail24mBG on admission was 225. --NPO --SSI-M for now  MDD: Patient with MDD on home regimen of zoloft 100mg daily12mhold zoloft for now    GERD: Patient on pantoprazole 40mg daily 16mranitidine 150mg BID at 54m --restart after patient tolerating PO  Diet: NPO IVF: none DVT: SCD Code: FULL  Dispo: Admit patient to Inpatient with expected length of stay greater than 2 midnights.  Signed: Joan Herschberger SvalinAlphonzo Grieve8, 2:28 AM  Pager [phone]

## 2017-01-15 NOTE — Op Note (Signed)
01/15/2017  6:51 PM  PATIENT:  Jennifer [name]  56 y.o. female  PRE-OPERATIVE DIAGNOSIS:  Osteomyelitis right fifth digit. Cellulitis right foot.   POST-OPERATIVE DIAGNOSIS:  Osteomyelitis right fifth digit. Cellulitis right foot.  PROCEDURE:  Procedure(s): #1 PARTIAL FIFTH RAY AMPUTATION RIGHT FOOT #2 INCISION AND DRAINAGE RIGHT FOOT  SURGEON:  Surgeon(s) and Role:    * Edrick Kins, DPM - Primary  PHYSICIAN ASSISTANT: None  ASSISTANTS: none   ANESTHESIA:   local and MAC. 50-50 mixture of 2% lidocaine plain with 0.5% Marcaine plain totaling 10 mL infiltrated patient right lower extremity  EBL:  No intake/output data recorded.  BLOOD ADMINISTERED:none  DRAINS: none   LOCAL MEDICATIONS USED:  BUPIVICAINE  and XYLOCAINE   SPECIMEN:  Source of Specimen:  Deep wound culture right foot  DISPOSITION OF SPECIMEN:  N/A  COUNTS:  YES  TOURNIQUET:   Total Tourniquet Time Documented: Calf (Right) - 25 minutes Total: Calf (Right) - 25 minutes   JUSTIFICATION FOR PROCEDURE: 56 year old female with a history of diabetes mellitus and multiple comorbidities with a history of below-knee amputation the left lower extremity presents for surgical management of osteomyelitis to the fifth digit right foot. Patient has been managed in the past at the [facility] wound care center for wound care however the patient has chronic osteomyelitis acute cellulitis the patient was referred to podiatry for surgical management.   The patient was told benefits as well as possible side effects the surgery. The patient consented for surgical correction. The patient consent form was reviewed. All patient questions were answered. No guarantees were expressed or implied. The patient and the surgeon boson the patient consent form with the witness present and placed in the patient's chart.  PROCEDURE IN DETAIL: The patient was brought to the operating room placed the operating table in supine  position at which time an aseptic scrub and drape were performed about the patient's right lower extremity after anesthesia was induced as described above. The right ankle tourniquet was inflated to a pressure 250 mg mercury and attention was directed to the patient's fifth digit right foot where procedure number one commenced.  PROCEDURE #1: PARTIAL FIFTH RAY AMPUTATION RIGHT FOOT.  A racquet type incision was planned and made about the metatarsal phalangeal joint of the fifth digit right foot. The incision was carried down the level of bone and joint capsule with care taken to cut clamped ligated or retracted way all small neurovascular structures traversing the incision site. The fifth digit was then grasped with a sharp towel clamp and an #15 surgical blade was utilized to create a capsulotomy about the fifth MPJ and release all soft tissue structures and capsular tissue about the metatarsal phalangeal joint. The fifth digit was then removed in toto and placing sterile specimen container and sent to pathology. Intraoperative evaluation indicated that there was degenerative changes within the fifth metatarsal head. Also in order to allow for better primary closure is determined that the fifth metatarsal head needed to be resected. A #38 blade sagittal saw was then utilized to create an osteotomy of the surgical phalangeal neck fifth metatarsal right foot. The orientation of the osteotomy was in a dorsal distal to proximal plantar orientation. The head of the fifth metatarsal was then removed in toto and placing sterile specimen container. A rasp was also utilized to smooth all bony contours and prominences about the shaft of the fifth metatarsal right foot. At this time attention was directed within the incision site  where procedure number two commenced.  PROCEDURE #2: INCISION AND DRAINAGE RIGHT FOOT. At this time a curved mosquito was utilized for blunt dissection and exploration within the soft tissues  of the amputation site of the right foot. Any purulent drainage was identified and drained. There was minimal purulent drainage or necrotic tissue. All tissue margins appear to be healthy and viable. At this time deep wound cultures were taken. The incision and amputation site was copiously irrigated with normal saline and dried in preparation for routine their soft tissue closure.  4-0 Prolene suture was utilized to reapproximate superficial skin edges. Dry sterile compressive dressings were then applied to all previously mentioned incision sites about the patient's right lower extremity. The right ankle tourniquet which WAS used for hemostasis was deflated. All normal neurovascular responses including pink color and warmth returned all the digits of the patient's right lower extremity.  The patient was then transferred from the operating room to the recovery room having tolerated the procedure and anesthesia well. All vital signs are stable.  Patient is to follow up in the office next week post discharge. Minimal weightbearing to the right lower extremity in a postoperative shoe.  SIGNIFICANT FINDINGS: The tissue margins of the amputation site appear healthy and viable. From a surgical standpoint the amputation site is stable. At the moment there appears to be no indication for long-term antibiotic therapy.  PLAN OF CARE: Admit to inpatient  PATIENT DISPOSITION:  PACU - hemodynamically stable.   Delay start of Pharmacological VTE agent (>24hrs) due to surgical blood loss or risk of bleeding: not applicable  Edrick Kins, DPM [facility]  Dr. Edrick Kins, [address]                                             [place], [place] [address]                Office [phone]  Fax [phone]

## 2017-01-15 NOTE — ED Notes (Signed)
Patient CBG was 215.

## 2017-01-15 NOTE — Anesthesia Postprocedure Evaluation (Addendum)
Anesthesia Post Note  Patient: Jennifer [name]  Procedure(s) Performed: Procedure(s) (LRB): AMPUTATION 5th TOE RIGHT FOOT (Right)  Patient location during evaluation: PACU Anesthesia Type: MAC Level of consciousness: awake and alert Pain management: pain level controlled Vital Signs Assessment: post-procedure vital signs reviewed and stable Respiratory status: spontaneous breathing, nonlabored ventilation, respiratory function stable and patient connected to nasal cannula oxygen Cardiovascular status: stable and blood pressure returned to baseline Anesthetic complications: no       Last Vitals:  Vitals:   01/15/17 1915 01/15/17 1930  BP: (!) 171/83 (!) 167/78  Pulse: 65 (!) 59  Resp: 18 12  Temp:      Last Pain:  Vitals:   01/15/17 1930  TempSrc:   PainSc: 5                  Josejulian Tarango

## 2017-01-15 NOTE — Anesthesia Preprocedure Evaluation (Addendum)
Anesthesia Evaluation  Patient identified by MRN, date of birth, ID band Patient awake    Reviewed: Allergy & Precautions, NPO status , Patient's Chart, lab work & pertinent test results  Airway Mallampati: II  TM Distance: >3 FB Neck ROM: Full    Dental no notable dental hx.    Pulmonary COPD, Current Smoker,    Pulmonary exam normal breath sounds clear to auscultation       Cardiovascular hypertension, Pt. on medications + Peripheral Vascular Disease  Normal cardiovascular exam Rhythm:Regular Rate:Normal     Neuro/Psych negative neurological ROS  negative psych ROS   GI/Hepatic negative GI ROS, Neg liver ROS, GERD  Medicated and Controlled,  Endo/Other  diabetes, Type 2, Insulin DependentMorbid obesity  Renal/GU negative Renal ROS  negative genitourinary   Musculoskeletal  (+) Arthritis , Rheumatoid disorders,    Abdominal   Peds negative pediatric ROS (+)  Hematology negative hematology ROS (+)   Anesthesia Other Findings   Reproductive/Obstetrics negative OB ROS                            Anesthesia Physical Anesthesia Plan  ASA: III  Anesthesia Plan: MAC   Post-op Pain Management:    Induction: Intravenous  Airway Management Planned: Simple Face Mask  Additional Equipment:   Intra-op Plan: Delibrate Circulatory arrest per surgeon request  Post-operative Plan:   Informed Consent: I have reviewed the patients History and Physical, chart, labs and discussed the procedure including the risks, benefits and alternatives for the proposed anesthesia with the patient or authorized representative who has indicated his/her understanding and acceptance.   Dental advisory given  Plan Discussed with: CRNA and Surgeon  Anesthesia Plan Comments:        Anesthesia Quick Evaluation

## 2017-01-15 NOTE — Progress Notes (Signed)
   Subjective: Feels well with no complaints, including fevers/chills, chest pain, or dyspnea.  No new pain or exudate from her foot.  Understands plan for NPO after breakfast and surgery this afternoon.  Discussed patient on phone w/ podiatrist Dr Daylene Katayama.  He is planning for a ray amputation this afternoon.  Patient requested admission for surgery rather than as outpatient.  Objective:  Vital signs in last 24 hours: Vitals:   01/14/17 2113 01/15/17 0142 01/15/17 0300 01/15/17 0345  BP: 148/60 134/75 133/71 136/72  Pulse: 65 62 67 77  Resp: 16 16 16 18   Temp: 98.1 F (36.7 C)     TempSrc: Oral     SpO2: 94% 95% 91% 96%   Physical Exam  Constitutional: She is oriented to person, place, and time.  Obese, in no distress  Cardiovascular: Normal rate and regular rhythm.   Pulmonary/Chest: Effort normal and breath sounds normal.  Musculoskeletal:  R foot clean bandage S/p L BKA amputation  Neurological: She is alert and oriented to person, place, and time.  Psychiatric: She has a normal mood and affect. Her behavior is normal.   CBC Latest Ref Rng & Units 01/14/2017 01/12/2017 01/09/2017  WBC 4.0 - 10.5 K/uL 9.7 9.1 10.7(H)  Hemoglobin 12.0 - 15.0 g/dL 13.9 13.4 14.4  Hematocrit 36.0 - 46.0 % 43.0 42.6 43.6  Platelets 150 - 400 K/uL 186 145(L) 135(L)   CMP Latest Ref Rng & Units 01/14/2017 01/12/2017 01/11/2017  Glucose 65 - 99 mg/dL 70 275(H) 312(H)  BUN 6 - 20 mg/dL 12 16 18   Creatinine 0.44 - 1.00 mg/dL 0.75 0.88 0.99  Sodium 135 - 145 mmol/L 142 139 136  Potassium 3.5 - 5.1 mmol/L 4.1 4.8 4.3  Chloride 101 - 111 mmol/L 107 106 106  CO2 22 - 32 mmol/L 25 27 22   Calcium 8.9 - 10.3 mg/dL 9.0 8.5(L) 8.3(L)  Total Protein 6.5 - 8.1 g/dL 6.0(L) - -  Total Bilirubin 0.3 - 1.2 mg/dL 0.4 - -  Alkaline Phos 38 - 126 U/L 59 - -  AST 15 - 41 U/L 35 - -  ALT 14 - 54 U/L 34 - -     Assessment/Plan:  Principal Problem:   Toe osteomyelitis, right (HCC) Active Problems:   Opioid  dependence (HCC)   Severe obesity (BMI >= 40) (HCC)   S/P BKA (below knee amputation) unilateral (HCC)   Uncontrolled diabetes mellitus with retinopathy, due to underlying condition, without macular edema ([place])   56 y.o. female with poorly controlled diabetes and osteomyelitis of R 5th toe who is admitted for amputation by podiatry scheduled for afternoon of 1/25.  She is  medically stable but has history of multiple chronic systemic diseases which increase her risk of complications.  #Osteomyelitis #Diabetic Foot Ulcer 5th digit amputation by Dr Daylene Katayama scheduled for afternoon of 1/25 -NPO after breakfast  #DM On home regimen of Novolog Mix 75/25 140U qAM, 85U qPM and metformin 500mg  daily.  Last took insulin yesterday morning. -Hold metformin -NPO after breakfast -Lantus 10U -SSI AC  #HTN Home regimen of amlodipine 10mg  daily, atenolol 100mg  daily, HCTZ 25mg  daily, and benazepril 40mg  daily.  Normotensive since admission. -Continue  #Opioid Dependence -Continue home Percocet Q4H PRN  Fluids: NS 100 mL/hr Diet: NPO after breakfast DVT Prophylaxis: SCDs Code Status: full  Dispo: Anticipated discharge in approximately 1-2 day(s).   Jennifer Liberty, MD 01/15/2017, 10:28 AM Pager: [phone]

## 2017-01-16 ENCOUNTER — Encounter (HOSPITAL_COMMUNITY): Payer: Self-pay | Admitting: Podiatry

## 2017-01-16 ENCOUNTER — Other Ambulatory Visit: Payer: Self-pay

## 2017-01-16 DIAGNOSIS — E1152 Type 2 diabetes mellitus with diabetic peripheral angiopathy with gangrene: Secondary | ICD-10-CM | POA: Diagnosis not present

## 2017-01-16 DIAGNOSIS — Z885 Allergy status to narcotic agent status: Secondary | ICD-10-CM | POA: Diagnosis not present

## 2017-01-16 DIAGNOSIS — Z95828 Presence of other vascular implants and grafts: Secondary | ICD-10-CM | POA: Diagnosis not present

## 2017-01-16 DIAGNOSIS — E1169 Type 2 diabetes mellitus with other specified complication: Secondary | ICD-10-CM | POA: Diagnosis not present

## 2017-01-16 DIAGNOSIS — Z89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): Secondary | ICD-10-CM | POA: Diagnosis not present

## 2017-01-16 DIAGNOSIS — B9689 Other specified bacterial agents as the cause of diseases classified elsewhere: Secondary | ICD-10-CM | POA: Diagnosis not present

## 2017-01-16 DIAGNOSIS — I110 Hypertensive heart disease with heart failure: Secondary | ICD-10-CM | POA: Diagnosis not present

## 2017-01-16 DIAGNOSIS — E11621 Type 2 diabetes mellitus with foot ulcer: Secondary | ICD-10-CM | POA: Diagnosis not present

## 2017-01-16 DIAGNOSIS — E11319 Type 2 diabetes mellitus with unspecified diabetic retinopathy without macular edema: Secondary | ICD-10-CM | POA: Diagnosis not present

## 2017-01-16 DIAGNOSIS — E1142 Type 2 diabetes mellitus with diabetic polyneuropathy: Secondary | ICD-10-CM | POA: Diagnosis not present

## 2017-01-16 DIAGNOSIS — M869 Osteomyelitis, unspecified: Secondary | ICD-10-CM | POA: Diagnosis not present

## 2017-01-16 DIAGNOSIS — E1165 Type 2 diabetes mellitus with hyperglycemia: Secondary | ICD-10-CM | POA: Diagnosis not present

## 2017-01-16 DIAGNOSIS — Z91041 Radiographic dye allergy status: Secondary | ICD-10-CM | POA: Diagnosis not present

## 2017-01-16 DIAGNOSIS — Z79899 Other long term (current) drug therapy: Secondary | ICD-10-CM | POA: Diagnosis not present

## 2017-01-16 DIAGNOSIS — M86671 Other chronic osteomyelitis, right ankle and foot: Secondary | ICD-10-CM | POA: Diagnosis not present

## 2017-01-16 DIAGNOSIS — IMO0002 Reserved for concepts with insufficient information to code with codable children: Secondary | ICD-10-CM

## 2017-01-16 DIAGNOSIS — J449 Chronic obstructive pulmonary disease, unspecified: Secondary | ICD-10-CM | POA: Diagnosis not present

## 2017-01-16 DIAGNOSIS — L03115 Cellulitis of right lower limb: Secondary | ICD-10-CM | POA: Diagnosis not present

## 2017-01-16 DIAGNOSIS — I5032 Chronic diastolic (congestive) heart failure: Secondary | ICD-10-CM | POA: Diagnosis not present

## 2017-01-16 LAB — GLUCOSE, CAPILLARY
Glucose-Capillary: 252 mg/dL — ABNORMAL HIGH (ref 65–99)
Glucose-Capillary: 286 mg/dL — ABNORMAL HIGH (ref 65–99)
Glucose-Capillary: 252 mg/dL — ABNORMAL HIGH (ref 65–99)

## 2017-01-16 MED ORDER — ACETAMINOPHEN 325 MG PO TABS: 650.0000 mg | ORAL_TABLET | Freq: Once | ORAL | Status: DC

## 2017-01-16 MED ORDER — KETOROLAC TROMETHAMINE 30 MG/ML IJ SOLN
30.0000 mg | Freq: Once | INTRAMUSCULAR | Status: AC
  Administered 2017-01-16: 30 mg via INTRAVENOUS
  Filled 2017-01-16: qty 1

## 2017-01-16 MED ORDER — NAPROXEN 500 MG PO TABS: 500.0000 mg | ORAL_TABLET | Freq: Two times a day (BID) | ORAL | 0 refills | Status: AC

## 2017-01-16 MED ORDER — ALUM & MAG HYDROXIDE-SIMETH 200-200-20 MG/5ML PO SUSP
30.0000 mL | Freq: Four times a day (QID) | ORAL | Status: DC | PRN
  Administered 2017-01-16: 30 mL via ORAL
  Filled 2017-01-16: qty 30

## 2017-01-16 MED ORDER — KETOROLAC TROMETHAMINE 30 MG/ML IJ SOLN: 15.0000 mg | Freq: Four times a day (QID) | INTRAMUSCULAR | Status: DC | PRN

## 2017-01-16 NOTE — Patient Outreach (Signed)
[facility]) Care Management  01/16/17  Jennifer [name] [birth year] [id number]  Patient remains hospitalized following amputation of right 5th toe. Based on review, it appears that pain management is an issue today, with patient complaining of pain 10/10 in her right foot. Difficult to manage pain due to sedation and chronic opioid dependence.  Estimated discharge in 0-1 days. RNCM will follow up next week for discharge and transition of care.  [name], RN, BSN, [place] Management Coordinator [phone]

## 2017-01-16 NOTE — Discharge Summary (Signed)
Name: Jennifer [name] MRN: [id number] DOB: March 17, 1961 56 y.o. PCP: Bartholomew Crews, MD  Date of Admission: 01/15/2017 12:56 AM Date of Discharge: 01/16/2017 Attending Physician: Lucious Groves, DO  Discharge Diagnosis:  Principal Problem:   Toe osteomyelitis, right ([place]) Active Problems:   Opioid dependence ([place])   Severe obesity (BMI >= 40) (HCC)   S/P BKA (below knee amputation) unilateral ([place])   Uncontrolled diabetes mellitus with retinopathy, due to underlying condition, without macular edema [facility])   Discharge Medications: Allergies as of 01/16/2017      Reactions   Abilify [aripiprazole] Other (See Comments)   Urinary freq Nov 2016   Iohexol     Desc: IV CONTRAST CAUSE NEPHROPATHY IN 2007   Ivp Dye [iodinated Diagnostic Agents]    Morphine Sulfate Itching, Rash      Medication List    TAKE these medications   ADVAIR DISKUS 250-50 MCG/DOSE Aepb Generic drug:  Fluticasone-Salmeterol TAKE 1 INHALATION BY MOUTH TWICE DAILY   amLODipine 10 MG tablet Commonly known as:  NORVASC TAKE 1 TABLET BY MOUTH EVERY DAY   atenolol 100 MG tablet Commonly known as:  TENORMIN Take 1 tablet (100 mg total) by mouth daily.   baclofen 10 MG tablet Commonly known as:  LIORESAL Take 1 tablet (10 mg total) by mouth 3 (three) times daily as needed for muscle spasms. Reported on 06/27/2016   benazepril 40 MG tablet Commonly known as:  LOTENSIN Take 40 mg by mouth daily.   EASY TOUCH PEN NEEDLES 31G X 8 MM Misc Generic drug:  Insulin Pen Needle USE TO INJECT INSULIN TWICE DAILY   furosemide 40 MG tablet Commonly known as:  LASIX Take 1 tablet (40 mg total) by mouth daily as needed.   hydrochlorothiazide 25 MG tablet Commonly known as:  HYDRODIURIL Take 1 tablet (25 mg total) by mouth daily.   Insulin Lispro Prot & Lispro (75-25) 100 UNIT/ML Kwikpen Commonly known as:  HUMALOG MIX 75/25 KWIKPEN Inject 140 units subcutaneously before breakfast and 85 units before supper   ipratropium 0.02 % nebulizer solution Commonly known as:  ATROVENT USE 1 VIAL VIA NEBULIZER EVERY 6 HOURS AS NEEDED FOR WHEEZING   loperamide 2 MG capsule Commonly known as:  IMODIUM Take 1 capsule (2 mg total) by mouth as needed for diarrhea or loose stools.   LYRICA 200 MG capsule Generic drug:  pregabalin TAKE 1 CAPSULE BY MOUTH THREE TIMES A DAY   metFORMIN 500 MG 24 hr tablet Commonly known as:  GLUCOPHAGE XR Take 1 tablet (500 mg total) by mouth daily with breakfast.   naproxen 500 MG tablet Commonly known as:  NAPROSYN Take 1 tablet (500 mg total) by mouth 2 (two) times daily with a meal.   nystatin powder Commonly known as:  MYCOSTATIN/NYSTOP APPLY TO AFFECTED AREA THREE TIMES A DAY   ONETOUCH DELICA LANCETS 99991111 Misc USE TO CHECK BLOOD SUGARS FOUR TIMES A DAY   ONETOUCH VERIO test strip Generic drug:  glucose blood USE 3-4 TIMES DAILY TO CHECK BLOOD SUGAR   oxyCODONE-acetaminophen 5-325 MG tablet Commonly known as:  PERCOCET/ROXICET Take 1-2 tablets by mouth every 4 (four) hours as needed for severe pain.   pantoprazole 40 MG tablet Commonly known as:  PROTONIX TAKE 1 TABLET (40 MG TOTAL) BY MOUTH DAILY.   phenazopyridine 200 MG tablet Commonly known as:  PYRIDIUM Take 1 tablet (200 mg total) by mouth 3 (three) times daily as needed for pain.   potassium chloride SA  20 MEQ tablet Commonly known as:  K-DUR,KLOR-CON TAKE 2 TABLETS BY MOUTH DAILY   PROAIR HFA 108 (90 Base) MCG/ACT inhaler Generic drug:  albuterol INHALE 2 PUFFS BY MOUTH EVERY 6 HOURS AS NEEDED FOR WHEEZING   albuterol (2.5 MG/3ML) 0.083% nebulizer solution Commonly known as:  PROVENTIL INHALE THE CONTENTS OF 1 VIAL VIA NEBULIZER EVERY 6 HOURS AS NEEDED FOR WHEEZING   ranitidine 150 MG tablet Commonly known as:  ZANTAC Take 1 tablet (150 mg total) by mouth 2 (two) times daily.   rosuvastatin 20 MG tablet Commonly known as:  CRESTOR Take 20 mg by mouth at bedtime.   sertraline 100  MG tablet Commonly known as:  ZOLOFT Take 1 tablet (100 mg total) by mouth daily.   traZODone 100 MG tablet Commonly known as:  DESYREL TAKE 1 TABLET BY MOUTH DAILY AT BEDTIME AS NEEDED FOR SLEEP       Disposition and follow-up:   [name] was discharged from [facility] in Stable condition.  At the hospital follow up visit please address:  1.  Diabetes.  Continue working with her to improve glycemic control and facilitate compliance.  2.  Amputation.  Ensure wound healing, wound care, podiatry and wound care follow-up.  3.  Labs / imaging needed at time of follow-up: none  4.  Pending labs/ test needing follow-up: none  Follow-up Appointments: Follow-up Information    Edrick Kins, DPM Follow up.   Specialty:  Podiatry Contact information: [place] [address] [place], [place] Follow up.   Specialty:  Internal Medicine Contact information: [place] [address] [place] Hospital Course by problem list: Principal Problem:   Toe osteomyelitis, right (HCC) Active Problems:   Opioid dependence (HCC)   Severe obesity (BMI >= 40) (HCC)   S/P BKA (below knee amputation) unilateral (HCC)   Uncontrolled diabetes mellitus with retinopathy, due to underlying condition, without macular edema ([place])   1. 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  She was seen by podiatrist Dr Daylene Katayama on 1/24 and directed to the ED for admission and medical optimization prior to surgery.  She was admitted and underwent partial 5th ray amputation without complication on 99991111.  Her pain was controlled and she was not interested in SNF/rehab, reports that her 2 sons were at home to help her.  She was discharged home to follow up with Dr Amalia Hailey the following week.  2. DM She was NPO  prior to surgery, and given Lantus 10U and corrective Novolog on sliding scale with CBGs 100s-200s.  Discharge Vitals:   BP 133/67 (BP Location: Left Arm)   Pulse 71   Temp 98.9 F (37.2 C) (Oral)   Resp 16   SpO2 93%   Pertinent Labs, Studies, and Procedures:   CBC Latest Ref Rng & Units 01/14/2017 01/12/2017 01/09/2017  WBC 4.0 - 10.5 K/uL 9.7 9.1 10.7(H)  Hemoglobin 12.0 - 15.0 g/dL 13.9 13.4 14.4  Hematocrit 36.0 - 46.0 % 43.0 42.6 43.6  Platelets 150 - 400 K/uL 186 145(L) 135(L)   CMP Latest Ref Rng & Units 01/14/2017 01/12/2017 01/11/2017  Glucose 65 - 99 mg/dL 70 275(H) 312(H)  BUN 6 - 20 mg/dL 12 16 18   Creatinine 0.44 -  1.00 mg/dL 0.75 0.88 0.99  Sodium 135 - 145 mmol/L 142 139 136  Potassium 3.5 - 5.1 mmol/L 4.1 4.8 4.3  Chloride 101 - 111 mmol/L 107 106 106  CO2 22 - 32 mmol/L 25 27 22   Calcium 8.9 - 10.3 mg/dL 9.0 8.5(L) 8.3(L)  Total Protein 6.5 - 8.1 g/dL 6.0(L) - -  Total Bilirubin 0.3 - 1.2 mg/dL 0.4 - -  Alkaline Phos 38 - 126 U/L 59 - -  AST 15 - 41 U/L 35 - -  ALT 14 - 54 U/L 34 - -   MRI Right Foot 12/26/2016 IMPRESSION: 1. Osteomyelitis involving the proximal and middle phalanges of the small toe, likely with septic proximal interphalangeal joint containing gas and fluid. Bony destruction of the head of the proximal phalanx. Surrounding soft tissue edema with mild extension posteriorly into the dorsum of the foot. 2. Fusiform thickening of the medial band of the plantar fascia may reflect plantar fibromatosis. 3. 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 Low-level edema tracks along the plantar musculature of the foot.  Discharge Instructions: Discharge Instructions    Diet - low sodium heart healthy    Complete by:  As directed    Increase activity slowly    Complete by:  As directed      You were admitted to the hospital for amputation of your toe by  Dr Daylene Katayama because of the diabetic wound and bone infection.  The most important thing you can do to help it heal is control your blood sugars.  Please call the clinic and talk to [facility] or Dr [name] if you have any questions about your insulin or diabetes.    Keep your foot dry, do not change the bandage, and do not scratch/itch the wound.  Make sure you follow-up with Dr Amalia Hailey next week.  If you have any questions about the surgery or wound care, please call his office.  I have prescribed you Naproxen to take for pain in addition to your Percocet.  Signed: Minus Liberty, MD 01/16/2017, 12:05 PM   Pager: [phone]

## 2017-01-16 NOTE — Discharge Instructions (Addendum)
You were admitted to the hospital for amputation of your toe by Dr Daylene Katayama because of the diabetic wound and bone infection.  The most important thing you can do to help it heal is control your blood sugars.  Please call the clinic and talk to [facility] or Dr [name] if you have any questions about your insulin or diabetes.    Keep your foot dry, do not change the bandage, and do not scratch/itch the wound.  Make sure you follow-up with Dr Amalia Hailey next week.  If you have any questions about the surgery or wound care, please call his office.  I have prescribed you Naproxen to take for pain in addition to your Percocet.

## 2017-01-16 NOTE — Care Management CC44 (Signed)
Condition Code 44 Documentation Completed  Patient Details  Name: Jennifer [name] MRN: [id number] Date of Birth: 07-30-1961   Condition Code 44 given:  Yes Patient signature on Condition Code 44 notice:  Yes Documentation of 2 MD's agreement:  Yes Code 44 added to claim:  Yes    Ninfa Meeker, RN 01/16/2017, 2:14 PM

## 2017-01-16 NOTE — Progress Notes (Signed)
Results for AYBREE, BERNER (MRN [id number]) as of 01/16/2017 14:37  Ref. Range 01/15/2017 17:15 01/15/2017 18:54 01/15/2017 21:59 01/16/2017 06:52 01/16/2017 11:35  Glucose-Capillary Latest Ref Range: 65 - 99 mg/dL 161 (H) 157 (H) 180 (H) 252 (H) 286 (H)  Noted that blood sugars have been greater than 250 mg/dl today. Recommend increasing Lantus to 15 units daily if blood sugars continue to be elevated. Will continue to monitor blood sugars while in the hospital. Harvel Ricks RN BSN CDE

## 2017-01-16 NOTE — Progress Notes (Signed)
Internal Medicine Attending:   I saw and examined the patient. I reviewed the resident's note and I agree with the resident's findings and plan as documented in the resident's note. Patient overall doing well post op.  Blood sugars trended up overnight, it appears she never received the lantus yesterday but did receive 10 units + SSI this morning.  Discussed with patient importance of glycemic control for wound healing.  She will follow up with [place] in clinic as well as Dr Amalia Hailey.  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

## 2017-01-16 NOTE — Evaluation (Signed)
Physical Therapy Evaluation Patient Details Name: Jennifer [name] MRN: [id number] DOB: 1961/02/13 Today's Date: 01/16/2017   History of Present Illness  pt is a 56 yo female admitted through ED on 01/15/17 for right 5th ray amputation. PMH significant for uncontrolled DM2, Diabetic retinopathy, HTN, CHF, PVD, COPD, L BKA, MDD, GERD.   Clinical Impression  Pt is POD 1 following the above procedure. Prior to admission, pt was living in an apartment with her children who are available to assist PRN. Pt functioned mostly at [facility] level with occasional gait with RW with LLE prosthesis. Pt is resistance to therapy evaluation this session, but is agreeable to participate. Pt is able to stand pivot transfer to recliner with min guard for safety. Pt will benefit from HHPT upon discharge in order to maximize her functional outcomes once she returns home.     Follow Up Recommendations Home health PT;Supervision - Intermittent    Equipment Recommendations  3in1 (PT);Other (comment) (bariatric commode)    Recommendations for Other Services       Precautions / Restrictions Precautions Precautions: Fall Restrictions Weight Bearing Restrictions: Yes RLE Weight Bearing: Partial weight bearing RLE Partial Weight Bearing Percentage or Pounds: Unsure, MD reports to limit Weight bearing Other Position/Activity Restrictions: Per MD: Limit weight bearing through RLE      Mobility  Bed Mobility Overal bed mobility: Modified Independent             General bed mobility comments: pt got EOB without assistance while PT was setting up chair.   Transfers Overall transfer level: Needs assistance Equipment used: None Transfers: Stand Pivot Transfers;Sit to/from Stand Sit to Stand: Min guard Stand pivot transfers: Min guard       General transfer comment: Min guard for safety and cues for hand placement.   Ambulation/Gait             General Gait Details: unable to assess  Stairs             Wheelchair Mobility    Modified Rankin (Stroke Patients Only)       Balance                                             Pertinent Vitals/Pain Pain Assessment: 0-10 Pain Score: 10-Worst pain ever Pain Location: pain in feet Pain Descriptors / Indicators: Grimacing;Restless Pain Intervention(s): Monitored during session;Premedicated before session    [place] expects to be discharged to:: Private residence Living Arrangements: Children Available Help at Discharge: Family;Available PRN/intermittently Type of Home: Apartment Home Access: Stairs to enter Entrance Stairs-Rails: Right;Left Entrance Stairs-Number of Steps: 2+5 Home Layout: One level Home Equipment: Bedside commode;Shower seat;Walker - 2 wheels;Other (comment);Wheelchair - manual      Prior Function Level of Independence: Independent with assistive device(s)         Comments: is able to walk short distances. Does most activity from [facility]     Hand Dominance   Dominant Hand: Right    Extremity/Trunk Assessment   Upper Extremity Assessment Upper Extremity Assessment: Overall WFL for tasks assessed    Lower Extremity Assessment Lower Extremity Assessment: Generalized weakness       Communication   Communication: No difficulties  Cognition Arousal/Alertness: Lethargic;Suspect due to medications Behavior During Therapy: Flat affect Overall Cognitive Status: Within Functional Limits for tasks assessed  General Comments      Exercises     Assessment/Plan    PT Assessment All further PT needs can be met in the next venue of care  PT Problem List Decreased strength;Decreased range of motion;Decreased activity tolerance;Decreased balance;Decreased mobility;Decreased knowledge of use of DME;Pain          PT Treatment Interventions      PT Goals (Current goals can be found in the Care Plan section)  Acute Rehab PT  Goals Patient Stated Goal: to get home PT Goal Formulation: With patient Time For Goal Achievement: 01/23/17 Potential to Achieve Goals: Good    Frequency     Barriers to discharge        Co-evaluation               End of Session Equipment Utilized During Treatment: Gait belt Activity Tolerance: Patient tolerated treatment well Patient left: in chair;with call bell/phone within reach;with nursing/sitter in room Nurse Communication: Mobility status         Time: 1120-1130 PT Time Calculation (min) (ACUTE ONLY): 10 min   Charges:   PT Evaluation $PT Eval Moderate Complexity: 1 Procedure     PT G Codes:        Scheryl Marten PT, DPT  [phone]  01/16/2017, 12:03 PM

## 2017-01-16 NOTE — Care Management Obs Status (Signed)
[place] NOTIFICATION   Patient Details  Name: Jennifer [name] MRN: [id number] Date of Birth: Nov 11, 1961   Medicare Observation Status Notification Given:  Yes    Ninfa Meeker, RN 01/16/2017, 2:14 PM

## 2017-01-16 NOTE — Progress Notes (Signed)
Subjective: Underwent partial R 5th ray amputation by Dr Daylene Katayama yesterday evening without complication.  Now complaining of 10/10 pain in the right foot but sleeping comfortably.  Ate breakfast without nausea or vomiting, denies chest pain or shortness of breath.  Her biggest concern is avoiding further amputations.  She would not be interested in rehab placement, and has help with her 2 sons at home.  Objective:  Vital signs in last 24 hours: Vitals:   01/15/17 2000 01/15/17 2022 01/16/17 0016 01/16/17 0650  BP: (!) 175/69 (!) 165/92 (!) 156/52 133/67  Pulse: (!) 58 (!) 57 68 71  Resp: 16 16 16 16   Temp: 98.1 F (36.7 C) 97.9 F (36.6 C) 98.1 F (36.7 C) 98.9 F (37.2 C)  TempSrc:  Oral Oral Oral  SpO2: 92% 93% 92% 93%   Physical Exam  Constitutional: She is oriented to person, place, and time.  Obese woman sitting up in bed, somnolent  Cardiovascular: Normal rate and regular rhythm.   Pulmonary/Chest: Effort normal and breath sounds normal.  Musculoskeletal:  R foot clean bandage S/p L BKA amputation  Neurological: She is alert and oriented to person, place, and time.  Psychiatric: She has a normal mood and affect. Her behavior is normal.   CBC Latest Ref Rng & Units 01/14/2017 01/12/2017 01/09/2017  WBC 4.0 - 10.5 K/uL 9.7 9.1 10.7(H)  Hemoglobin 12.0 - 15.0 g/dL 13.9 13.4 14.4  Hematocrit 36.0 - 46.0 % 43.0 42.6 43.6  Platelets 150 - 400 K/uL 186 145(L) 135(L)   CMP Latest Ref Rng & Units 01/14/2017 01/12/2017 01/11/2017  Glucose 65 - 99 mg/dL 70 275(H) 312(H)  BUN 6 - 20 mg/dL 12 16 18   Creatinine 0.44 - 1.00 mg/dL 0.75 0.88 0.99  Sodium 135 - 145 mmol/L 142 139 136  Potassium 3.5 - 5.1 mmol/L 4.1 4.8 4.3  Chloride 101 - 111 mmol/L 107 106 106  CO2 22 - 32 mmol/L 25 27 22   Calcium 8.9 - 10.3 mg/dL 9.0 8.5(L) 8.3(L)  Total Protein 6.5 - 8.1 g/dL 6.0(L) - -  Total Bilirubin 0.3 - 1.2 mg/dL 0.4 - -  Alkaline Phos 38 - 126 U/L 59 - -  AST 15 - 41 U/L 35 - -  ALT  14 - 54 U/L 34 - -     Assessment/Plan:  Principal Problem:   Toe osteomyelitis, right (HCC) Active Problems:   Opioid dependence (HCC)   Severe obesity (BMI >= 40) (HCC)   S/P BKA (below knee amputation) unilateral (HCC)   Uncontrolled diabetes mellitus with retinopathy, due to underlying condition, without macular edema ([place])   56 y.o. female with poorly controlled diabetes and osteomyelitis of R 5th toe who is POD1 from amputation of R 5th toe by Dr Daylene Katayama on afternoon of 1/25.  She is stable but complaining of intense postoperative pain.  Her pain management is complicated by sedation and chronic opioid dependence; will attempt to optimize multimodal pain management.  #Osteomyelitis #R Toe Amputation 5th digit amputation by Dr Daylene Katayama on afternoon of 99991111 without complication. -Toradol -Continue home Percocet 5-10 Q4H -Will monitor pain and sedation before further increasing narcotic  #DM On home regimen of Novolog Mix 75/25 140U qAM, 85U qPM and metformin 500mg  daily.  Last took insulin yesterday morning. -Hold metformin -Lantus 10U -SSI AC  #HTN Home regimen of amlodipine 10mg  daily, atenolol 100mg  daily, HCTZ 25mg  daily, and benazepril 40mg  daily.  Normotensive since admission. -Hold home meds  #Opioid Dependence -Continue  home Percocet Q4H PRN  Fluids: none Diet: Carb modified DVT Prophylaxis: SCDs until POD2 Code Status: full  Dispo: Anticipated discharge in approximately 0-1 day(s).   Minus Liberty, MD 01/16/2017, 7:17 AM Pager: [phone]

## 2017-01-16 NOTE — Care Management Note (Signed)
Case Management Note  Patient Details  Name: Jennifer [name] MRN: [id number] Date of Birth: 1961-08-19  Subjective/Objective: 56 yr old female s/p right fifth toe amputation.                    Action/Plan: Case manager spoke with patient concerning Home Helth and DME needs. Patient states she is active with [facility]. CM contacted Christa See, [facility] to confirm. CM has ordered 3in1. Patient states she will have family support at discharge.   Expected Discharge Date:  01/16/17               Expected Discharge Plan:  [place]  In-House Referral:  NA  Discharge planning Services  CM Consult  Post Acute Care Choice:  Home Health, Durable Medical Equipment, Resumption of Svcs/PTA Provider Choice offered to:  Patient  DME Arranged:  3-N-1 DME Agency:  [place]:  PT [place]:  [facility]  Status of Service:  Completed, signed off  If discussed at [employer], dates discussed:    Additional Comments:  Ninfa Meeker, RN 01/16/2017, 2:12 PM

## 2017-01-19 ENCOUNTER — Telehealth: Payer: Self-pay

## 2017-01-19 ENCOUNTER — Other Ambulatory Visit: Payer: Self-pay

## 2017-01-19 NOTE — Patient Outreach (Signed)
Jennifer [name]) Care Management  01/19/17  Jennifer [name] 19-Aug-1961 [id number]  Initial outreach completed with patient. Patient identification verified with two identifiers.  Patient stated that she has been doing ok since her hospital discharge. 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 She reported that she has a dressing to surgical incision that is clean, dry and intact at present. She reports that she is to have home health come out for wound care, but not until she has follow up with surgeon. She continued to state that the dressing cannot be removed until she has her follow up with him. She currently denies any fever or drainage and reports she is to reinforce dressing with gauze if she observe any bleeding or drainage. Patient stated that her son has been observing the bottom of her foot/bandage for drainage since discharge. Patient has an appointment with Dr. Amalia Hailey on 01/21/2017 and patient stated that she has transportation arranged for this appointment.   Patient currently complains of 10+/10 pain in her right foot and "all over". Patient has previously been on pain medication due to fibromyalgia, neuropathy, osteoarthritis and generalized pain with little relief reported. She reported that she does have her pain medicine and has been taking Oxycodone-Acetaminophen 5-325 mg. She reports that she has been taking 2 tablets every 4 hours since discharge and it does not relieve her pain. She reported that she has been in pain since her surgery. She reported that she had hoped she could stay longer inpatient so that she could have someone more closely monitor it than at home.   Reviewed all medications with patient since discharge. Patient able to verbalize what medications she is taking and what each medication is for. Patient currently has all medications on her medicine profile with the exception of pyridium and ranitidine.  She stated that she does not take the ranitidine and she did not have the money to pick up the pyridium so she has not yet picked it up. She reported that she plans to get it on Thursday if necessary to have it. Patient stated that she is currently taking all other medications as prescribed.  Outpatient Encounter Prescriptions as of 01/19/2017  Medication Sig  . ADVAIR DISKUS 250-50 MCG/DOSE AEPB TAKE 1 INHALATION BY MOUTH TWICE DAILY (Patient not taking: Reported on 01/07/2017)  . albuterol (PROVENTIL) (2.5 MG/3ML) 0.083% nebulizer solution INHALE THE CONTENTS OF 1 VIAL VIA NEBULIZER EVERY 6 HOURS AS NEEDED FOR WHEEZING (Patient not taking: Reported on 01/07/2017)  . amLODipine (NORVASC) 10 MG tablet TAKE 1 TABLET BY MOUTH EVERY DAY (Patient not taking: Reported on 01/07/2017)  . atenolol (TENORMIN) 100 MG tablet Take 1 tablet (100 mg total) by mouth daily. (Patient not taking: Reported on 01/07/2017)  . baclofen (LIORESAL) 10 MG tablet Take 1 tablet (10 mg total) by mouth 3 (three) times daily as needed for muscle spasms. Reported on 06/27/2016 (Patient not taking: Reported on 01/07/2017)  . EASY TOUCH PEN NEEDLES 31G X 8 MM MISC USE TO INJECT INSULIN TWICE DAILY  . furosemide (LASIX) 40 MG tablet Take 1 tablet (40 mg total) by mouth daily as needed. (Patient not taking: Reported on 01/07/2017)  . hydrochlorothiazide (HYDRODIURIL) 25 MG tablet Take 1 tablet (25 mg total) by mouth daily. (Patient not taking: Reported on 01/07/2017)  . Insulin Lispro Prot & Lispro (HUMALOG MIX 75/25 KWIKPEN) (75-25) 100 UNIT/ML Kwikpen Inject 140 units subcutaneously before breakfast and 85 units  before supper (Patient not taking: Reported on 01/07/2017)  . ipratropium (ATROVENT) 0.02 % nebulizer solution USE 1 VIAL VIA NEBULIZER EVERY 6 HOURS AS NEEDED FOR WHEEZING (Patient not taking: Reported on 01/07/2017)  . loperamide (IMODIUM) 2 MG capsule Take 1 capsule (2 mg total) by mouth as needed for diarrhea or loose stools.  Marland Kitchen  LYRICA 200 MG capsule TAKE 1 CAPSULE BY MOUTH THREE TIMES A DAY (Patient not taking: Reported on 01/07/2017)  . metFORMIN (GLUCOPHAGE XR) 500 MG 24 hr tablet Take 1 tablet (500 mg total) by mouth daily with breakfast. (Patient not taking: Reported on 01/07/2017)  . [EXPIRED] naproxen (NAPROSYN) 500 MG tablet Take 1 tablet (500 mg total) by mouth 2 (two) times daily with a meal.  . nystatin (MYCOSTATIN) powder APPLY TO AFFECTED AREA THREE TIMES A DAY (Patient not taking: Reported on 01/07/2017)  . ONETOUCH DELICA LANCETS 41U MISC USE TO CHECK BLOOD SUGARS FOUR TIMES A DAY  . ONETOUCH VERIO test strip USE 3-4 TIMES DAILY TO CHECK BLOOD SUGAR  . oxyCODONE-acetaminophen (PERCOCET/ROXICET) 5-325 MG tablet Take 1-2 tablets by mouth every 4 (four) hours as needed for severe pain.  . pantoprazole (PROTONIX) 40 MG tablet TAKE 1 TABLET (40 MG TOTAL) BY MOUTH DAILY. (Patient not taking: Reported on 01/07/2017)  . phenazopyridine (PYRIDIUM) 200 MG tablet Take 1 tablet (200 mg total) by mouth 3 (three) times daily as needed for pain.  . potassium chloride SA (K-DUR,KLOR-CON) 20 MEQ tablet TAKE 2 TABLETS BY MOUTH DAILY (Patient not taking: Reported on 01/07/2017)  . PROAIR HFA 108 (90 Base) MCG/ACT inhaler INHALE 2 PUFFS BY MOUTH EVERY 6 HOURS AS NEEDED FOR WHEEZING (Patient not taking: Reported on 01/07/2017)  . ranitidine (ZANTAC) 150 MG tablet Take 1 tablet (150 mg total) by mouth 2 (two) times daily. (Patient not taking: Reported on 01/07/2017)  . rosuvastatin (CRESTOR) 20 MG tablet Take 20 mg by mouth at bedtime.  . sertraline (ZOLOFT) 100 MG tablet Take 1 tablet (100 mg total) by mouth daily. (Patient not taking: Reported on 01/07/2017)  . traZODone (DESYREL) 100 MG tablet TAKE 1 TABLET BY MOUTH DAILY AT BEDTIME AS NEEDED FOR SLEEP (Patient not taking: Reported on 01/07/2017)   No facility-administered encounter medications on file as of 01/19/2017.     Patient reported that since her discharge, she is having more  significant difficulty with mobility since she cannot put any pressure on her foot. Patient typically propels her self with her foot in her wheelchair, but currently is not able to do so. She stated that she is currently trying to get approval for a motorized scooter to increase her mobility. She reported that she was told that there was a $600-$900 copay for the wheelchair. She stated that she would have to pay half of the copay before it could be ordered and then pay the other half when it arrived. Currently denies any assistance with this as she has an "advocate" helping her.  Patient stated that she is to "take it easy" until her next appointment and not do anything strenuous. She reported that she is to keep her right foot elevated and she has been doing this as much as possible. She stated that her son Georgina Snell has been assisting her around the home. Patient reported that her appetite has been "so-so." RNCM encouraged patient to try to eat healthy meals and include protein to help with healing. Patient verbalized understanding of what she needs to do.  Patient currently has no other needs  or concerns at present.   Plan:   [facility] CM Care Plan Problem One   Flowsheet Row Most Recent Value  Care Plan Problem One  Recent hospital admission for COPD  Role Documenting the Problem One  Care Management [place] for Problem One  Active  [facility] Long Term Goal (31-90 days)  Patient will report no readmission related to COPD in the next 60 days.  [facility] Long Term Goal Start Date  12/01/16  Interventions for Problem One Long Term Goal  Encouraged patient to attend follow up appointments.  Placed call to [place] today to see if patient can be seen today for a dressing change.   [facility] CM Short Term Goal #1 (0-30 days)  Patient will report attending hospital follow up within the next 10 days.    [facility] CM Short Term Goal #1 Start Date  12/01/16  [facility] CM Short Term Goal #1 Met Date  12/16/16  Interventions for  Short Term Goal #1  Ecouraged patient to attend all MD appointments.  [facility] CM Short Term Goal #2 (0-30 days)  Patient will report taking all her medications as prescribed in the next 30 days.  [facility] CM Short Term Goal #2 Start Date  12/01/16  [facility] CM Short Term Goal #2 Met Date  01/20/16  Interventions for Short Term Goal #2  Reviewed with patient the importance of taking all medications as prescribed.,     [facility] CM Care Plan Problem Two   Flowsheet Row Most Recent Value  Care Plan Problem Two  Risk for readmission due to recent hospitalization for osteomylitis and toe amputation  Role Documenting the Problem Two  Care Management Coordinator  Care Plan for Problem Two  Active  Interventions for Problem Two Long Term Goal   RNCM educated on importance of attending all appointments, reporting any changes in her wound at home, taking all medications as prescribed  [facility] Long Term Goal (31-90) days  Patient will not readmit to the hospital within the next 31 days for post operative complications related to osteomyelitis and toe amputation  [facility] Long Term Goal Start Date  01/19/17  [facility] CM Short Term Goal #1 (0-30 days)  Patient will take all medications as prescribed for the next 30 days  [facility] CM Short Term Goal #1 Start Date  01/19/17  Interventions for Short Term Goal #2   RNCM educated patient on importance of taking prescribed antibiotics, pain management and all other medications for optimal health management  [facility] CM Short Term Goal #2 (0-30 days)  Patient will attend all scheduled post-operative appointments within the next 30 days  [facility] CM Short Term Goal #2 Start Date  01/19/17  Interventions for Short Term Goal #2  RNCM educated on importance of attending all scheduled appointments and ensured patient has transportation to appointments     Home visit next week to assess for needs and continued transition of care.  [place] R. Johnothan Bascomb, RN, BSN, [place] Management Coordinator [phone]

## 2017-01-19 NOTE — Telephone Encounter (Signed)
Pt states she needs help w/ her wh/ch. She states she cannot afford the co-pay, needs 600-900 dollars States she is in a lot of pain and it is the worse pain she has ever had Please call her at [phone]

## 2017-01-19 NOTE — Telephone Encounter (Signed)
Requesting to speak with Dr Software engineer. Please call back.

## 2017-01-20 LAB — AEROBIC/ANAEROBIC CULTURE W GRAM STAIN (SURGICAL/DEEP WOUND): Culture: NORMAL

## 2017-01-20 NOTE — Telephone Encounter (Signed)
Called pt. Phone rang without answer - no voice mail.

## 2017-01-20 NOTE — Telephone Encounter (Signed)
[place] I completed powerchair assessment with Jennifer Jennifer [name] and [place] last week. She is now notifying us that her copay is to be $600-900. Our clinic currently doesn't have a Education officer, museum. Are their any community grps who Jennifer [name] may contact about assistance?

## 2017-01-21 ENCOUNTER — Telehealth: Payer: Self-pay | Admitting: *Deleted

## 2017-01-21 ENCOUNTER — Ambulatory Visit: Payer: Medicare Other | Admitting: Podiatry

## 2017-01-21 NOTE — Telephone Encounter (Signed)
Pt called after I left message informing her it would be okay for the dressing to remain in place until 01/27/2017. I explained to pt that Dr. Amalia Hailey would want to perform the 1st dressing change. Pt states understanding.

## 2017-01-21 NOTE — Telephone Encounter (Signed)
A. Horton - Scheduler states pt had to schedule to 01/28/2017, and wanted to know if home health care could change the dressings. I left message instructing pt to leave dressing in place and Dr. Amalia Hailey would change at next appt.

## 2017-01-22 NOTE — Progress Notes (Signed)
   01/16/17 1156  PT G-Codes **NOT FOR INPATIENT CLASS**  Functional Assessment Tool Used clinical judegement  Functional Limitation Mobility: Walking and moving around  Mobility: Walking and Moving Around Current Status [phone]) CI  Mobility: Walking and Moving Around Goal Status [phone]) [place]   Late Entry G-Code 01/22/17  Scheryl Marten PT, DPT  [phone]

## 2017-01-24 ENCOUNTER — Telehealth: Payer: Self-pay | Admitting: Internal Medicine

## 2017-01-24 ENCOUNTER — Emergency Department (HOSPITAL_COMMUNITY)
Admission: EM | Admit: 2017-01-24 | Discharge: 2017-01-24 | Disposition: A | Discharge status: Home / Self Care | Payer: Medicare Other | Attending: Emergency Medicine | Admitting: Emergency Medicine

## 2017-01-24 ENCOUNTER — Encounter (HOSPITAL_COMMUNITY): Payer: Self-pay | Admitting: Nurse Practitioner

## 2017-01-24 DIAGNOSIS — J449 Chronic obstructive pulmonary disease, unspecified: Secondary | ICD-10-CM | POA: Diagnosis not present

## 2017-01-24 DIAGNOSIS — I110 Hypertensive heart disease with heart failure: Secondary | ICD-10-CM | POA: Insufficient documentation

## 2017-01-24 DIAGNOSIS — I5032 Chronic diastolic (congestive) heart failure: Secondary | ICD-10-CM | POA: Insufficient documentation

## 2017-01-24 DIAGNOSIS — E1121 Type 2 diabetes mellitus with diabetic nephropathy: Secondary | ICD-10-CM | POA: Insufficient documentation

## 2017-01-24 DIAGNOSIS — N611 Abscess of the breast and nipple: Secondary | ICD-10-CM | POA: Insufficient documentation

## 2017-01-24 DIAGNOSIS — E11319 Type 2 diabetes mellitus with unspecified diabetic retinopathy without macular edema: Secondary | ICD-10-CM | POA: Diagnosis not present

## 2017-01-24 DIAGNOSIS — E1159 Type 2 diabetes mellitus with other circulatory complications: Secondary | ICD-10-CM | POA: Insufficient documentation

## 2017-01-24 DIAGNOSIS — L02213 Cutaneous abscess of chest wall: Secondary | ICD-10-CM | POA: Diagnosis not present

## 2017-01-24 DIAGNOSIS — F17210 Nicotine dependence, cigarettes, uncomplicated: Secondary | ICD-10-CM | POA: Diagnosis not present

## 2017-01-24 DIAGNOSIS — L0291 Cutaneous abscess, unspecified: Secondary | ICD-10-CM

## 2017-01-24 MED ORDER — LIDOCAINE HCL (PF) 1 % IJ SOLN
5.0000 mL | Freq: Once | INTRAMUSCULAR | Status: AC
  Administered 2017-01-24: 5 mL
  Filled 2017-01-24: qty 5

## 2017-01-24 NOTE — ED Provider Notes (Signed)
[facility] DEPT Provider Note    By signing my name below, I, Bea Graff, attest that this documentation has been prepared under the direction and in the presence of [facility], [place]. Electronically Signed: Bea Graff, ED Scribe. 01/24/17. 8:47 PM.    History   Chief Complaint Chief Complaint  Patient presents with  . Skin Problem    The history is provided by the patient and medical records. No language interpreter was used.    Jennifer [name] is an obese 56 y.o. female who presents to the Emergency Department complaining of an abscess underneath her right breast that she noticed earlier today. She reports associated moderate soreness. She has not taken anything for pain relief. Touching the area increases her pain. She denies alleviating factors. She denies fever, chills, nausea, vomiting. She is currently on two different antibiotics for a toe amputation on 01/15/17 (9 days ago) but cannot remember the names.   Past Medical History:  Diagnosis Date  . Anginal pain [facility])    '3' of 10 ischemia ruled out 9/9   . 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 Asthma   . CHF (congestive heart failure) ([place])   . 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 Chronic bronchitis ([place])    "I get it alot" (09/28/2013)  . Chronic diastolic heart failure (HCC)    grade 2 per 2D echocardiogram (01/2013)  . Chronic lower back pain   . Chronic osteomyelitis of foot (HCC)    chronic, right secondary to diabetic foot ulcers  . Chronic pain syndrome 12/03/2011   Likely secondary to depression, "fibromyalgia", neuropathy, and obesity. Lumbar MRI 2014 no sig change from prior (2008) : Stable hypertrophic facet disease most notable at L4-5. Stable shallow left foraminal/extraforaminal disc protrusion at L4-5. No direct neural compression.      Marland Kitchen COPD 01/08/2007   PFT's 05/2007 : FEV1/FVC 82, FEV1 64% pred, FEF 25-75% 40% predicted, 16% improvement in FEV1 with bronchodilators.     .  Depression   . Diabetic peripheral neuropathy ([place])   . DVT of upper extremity (deep vein thrombosis) ([place]) 03/11/2013   Secondary to PICC line. Right brachial vein, diagnosed on 03/10/2013 Coumadin for 3 months. End date 06/10/2013   . Environmental allergies    Hx: of  . Exertional shortness of breath   . Fatty liver 2003   observed on ultrasound abdomen  . Fibromyalgia   . GERD (gastroesophageal reflux disease)   . Glaucoma   . Hyperlipidemia   . Hyperplastic colon polyp 12/2010   Per colonoscopy (12/2010) - Dr. Deatra Ina  . Hypertension   . 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 Blood cultures 01/26/13 S. Aureus and GBS. Blood cultures Feb 6th, 8th, and 9th and March were negative.Repeat TEE 3/20 negative for vegitations  . 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 2/2 tobacco use, DM, and cont weight bearing on surgical wound and developed gangrene   . Polymicrobial bacterial infection 01/2013   GBS and S. aureus bacteremia // Source likely infected diabetic foot ulcer  . PVD (peripheral vascular disease) with claudication ([place])    Stents to bilateral common iliac arteries (left 2005, right 2008), on chronic plavix  . Rheumatoid arthritis ([place])   . Tobacco abuse   . Type II diabetes mellitus with peripheral circulatory disorders, uncontrolled DX: 1993   Insulin dep.  Poor control. Complicated by diabetic foot ulcer and diabetic eye disease.    Marland Kitchen Ulcer of foot, chronic (HCC)    Left. No OM per MRI (01/2013)    Patient Active Problem List   Diagnosis Date Noted  . Toe amputation status, right ([place]) 01/16/2017  . Toe osteomyelitis, right ([place]) 01/15/2017  . Diarrhea 01/13/2017  . Dysuria 01/11/2017  . Oral mucosal lesion 11/07/2016  . Chronic cough 09/27/2016  . Diabetic osteomyelitis ([place]) 06/30/2016  . Thrombocytopenia ([place]) 06/05/2016  . Myoclonus  11/12/2015  . Type 2 diabetes with nephropathy ([place]) 09/07/2015  . Uncontrolled diabetes mellitus with retinopathy, due to underlying condition, without macular edema ([place]) 09/05/2015  . Diabetic retinopathy ([place]) 09/05/2015  . Counseling regarding end of life decision making 06/14/2015  . Atherosclerosis of aorta ([place]) 04/04/2015  . Anemia 10/05/2014  . Chronic diastolic heart failure ([place])   . S/P BKA (below knee amputation) unilateral ([place])   . Tobacco abuse   . Severe obesity (BMI >= 40) ([place]) 03/02/2013  . Abnormality of gait 03/01/2013  . Infective endocarditis 01/22/2013  . Healthcare maintenance 07/10/2012  . Opioid dependence ([place]) 12/03/2011  . Secondary diabetes mellitus with peripheral vascular disease ([place]) 08/27/2011  . 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 ([place]) 11/29/2009  . Hypertension associated with diabetes ([place]) 11/29/2009  . Chronic insomnia 10/25/2009  . GASTROESOPHAGEAL REFLUX DISEASE 11/24/2008  . Depression, major, severe recurrence ([place]) 04/06/2008  . DM (diabetes mellitus) type II uncontrolled, periph vascular disorder ([place]) 04/02/2007  . Hyperlipidemia associated with type 2 diabetes mellitus ([place]) 01/08/2007  . COPD exacerbation ([place]) 01/08/2007    Past Surgical History:  Procedure Laterality Date  . ABDOMINAL HYSTERECTOMY  1997   secondary to uterine fibroids  . AMPUTATION Left 08/31/2013   Procedure: AMPUTATION RAY;  Surgeon: Newt Minion, MD;  Location: [facility];  Service: Orthopedics;  Laterality: Left;  Left Foot 5th Ray Amputation  . AMPUTATION Left 09/28/2013   Procedure: Left Midfoot amputation;  Surgeon: Newt Minion, MD;  Location: [facility];  Service: Orthopedics;  Laterality: Left;  Left Midfoot amputation  . AMPUTATION Left 10/14/2013   Procedure: AMPUTATION BELOW KNEE- left;  Surgeon: Newt Minion, MD;  Location: [facility];  Service: Orthopedics;  Laterality: Left;  Left Below Knee Amputation   . AMPUTATION TOE Right 01/15/2017   Procedure:  AMPUTATION 5th TOE RIGHT FOOT;  Surgeon: Edrick Kins, DPM;  Location: [facility];  Service: Podiatry;  Laterality: Right;  . BLADDER SURGERY     bladder reconstruction surgery  . BREAST BIOPSY     multiple-benign per pt  . ESOPHAGOGASTRODUODENOSCOPY N/A 09/20/2013   Procedure: ESOPHAGOGASTRODUODENOSCOPY (EGD);  Surgeon: Jerene Bears, MD;  Location: [facility];  Service: Gastroenterology;  Laterality: N/A;  . FOOT AMPUTATION THROUGH METATARSAL Left 09/28/2013  . GANGLION CYST EXCISION     multiple  . OTHER SURGICAL HISTORY     stents in lower ext  . SHOULDER ARTHROSCOPY W/ ROTATOR CUFF REPAIR Bilateral   . SKIN SPLIT GRAFT Bilateral 05/13/2013   Procedure: Right and Left Foot Allograft Skin Graft;  Surgeon: Newt Minion, MD;  Location: [facility];  Service: Orthopedics;  Laterality: Bilateral;  Right and Left Foot Allograft Skin Graft  . TEE WITHOUT CARDIOVERSION N/A 01/31/2013   Procedure: TRANSESOPHAGEAL ECHOCARDIOGRAM (TEE);  Surgeon: Fay Records, MD;  Location: [facility];  Service: Cardiovascular;  Laterality: N/A;  Rm [phone]  . TEE WITHOUT CARDIOVERSION N/A 03/10/2013   Procedure: TRANSESOPHAGEAL ECHOCARDIOGRAM (  TEE);  Surgeon: Larey Dresser, MD;  Location: [facility];  Service: Cardiovascular;  Laterality: N/A;  Rm. [phone]  . TOE AMPUTATION Left 08/31/2013   4TH & 5 TH TOE   . TONSILLECTOMY    . TUBAL LIGATION    . WRIST SURGERY Right    "for tumors" (09/28/2013)    OB History    No data available       Home Medications    Prior to Admission medications   Medication Sig Start Date End Date Taking? Authorizing Provider  ADVAIR DISKUS 250-50 MCG/DOSE AEPB TAKE 1 INHALATION BY MOUTH TWICE DAILY Patient not taking: Reported on 01/07/2017 09/03/16   Bartholomew Crews, MD  albuterol (PROVENTIL) (2.5 MG/3ML) 0.083% nebulizer solution INHALE THE CONTENTS OF 1 VIAL VIA NEBULIZER EVERY 6 HOURS AS NEEDED FOR WHEEZING Patient not taking: Reported on 01/07/2017 12/03/16   Axel Filler, MD  amLODipine (NORVASC) 10 MG tablet TAKE 1 TABLET BY MOUTH EVERY DAY Patient not taking: Reported on 01/07/2017 10/01/16   Bartholomew Crews, MD  atenolol (TENORMIN) 100 MG tablet Take 1 tablet (100 mg total) by mouth daily. Patient not taking: Reported on 01/07/2017 07/23/16   Bartholomew Crews, MD  baclofen (LIORESAL) 10 MG tablet Take 1 tablet (10 mg total) by mouth 3 (three) times daily as needed for muscle spasms. Reported on 06/27/2016 Patient not taking: Reported on 01/07/2017 07/23/16   Bartholomew Crews, MD  benazepril (LOTENSIN) 40 MG tablet Take 40 mg by mouth daily. 11/26/16   Historical Provider, MD  EASY TOUCH PEN NEEDLES 31G X 8 MM MISC USE TO INJECT INSULIN TWICE DAILY 09/03/16   Bartholomew Crews, MD  furosemide (LASIX) 40 MG tablet Take 1 tablet (40 mg total) by mouth daily as needed. Patient not taking: Reported on 01/07/2017 07/23/16   Bartholomew Crews, MD  hydrochlorothiazide (HYDRODIURIL) 25 MG tablet Take 1 tablet (25 mg total) by mouth daily. Patient not taking: Reported on 01/07/2017 07/23/16   Bartholomew Crews, MD  Insulin Lispro Prot & Lispro (HUMALOG MIX 75/25 KWIKPEN) (75-25) 100 UNIT/ML Kwikpen Inject 140 units subcutaneously before breakfast and 85 units before supper Patient not taking: Reported on 01/07/2017 12/03/16   Axel Filler, MD  ipratropium (ATROVENT) 0.02 % nebulizer solution USE 1 VIAL VIA NEBULIZER EVERY 6 HOURS AS NEEDED FOR WHEEZING Patient not taking: Reported on 01/07/2017 09/26/16   Shela Leff, MD  loperamide (IMODIUM) 2 MG capsule Take 1 capsule (2 mg total) by mouth as needed for diarrhea or loose stools. 01/12/17   Minus Liberty, MD  LYRICA 200 MG capsule TAKE 1 CAPSULE BY MOUTH THREE TIMES A DAY Patient not taking: Reported on 01/07/2017 12/31/16   Bartholomew Crews, MD  metFORMIN (GLUCOPHAGE XR) 500 MG 24 hr tablet Take 1 tablet (500 mg total) by mouth daily with breakfast. Patient not taking: Reported on 01/07/2017  06/30/16 06/30/17  Holley Raring, MD  nystatin (MYCOSTATIN) powder APPLY TO AFFECTED AREA THREE TIMES A DAY Patient not taking: Reported on 01/07/2017 05/26/16   Bartholomew Crews, MD  [facility] DELICA LANCETS [phone] MISC USE TO CHECK BLOOD SUGARS FOUR TIMES A DAY 05/29/16   Bartholomew Crews, MD  [facility] VERIO test strip USE 3-4 TIMES DAILY TO CHECK BLOOD SUGAR 10/01/16   Bartholomew Crews, MD  oxyCODONE-acetaminophen (PERCOCET/ROXICET) 5-325 MG tablet Take 1-2 tablets by mouth every 4 (four) hours as needed for severe pain. 01/01/17   Bartholomew Crews, MD  pantoprazole (  PROTONIX) 40 MG tablet TAKE 1 TABLET (40 MG TOTAL) BY MOUTH DAILY. Patient not taking: Reported on 01/07/2017 12/23/16 12/23/17  Bartholomew Crews, MD  phenazopyridine (PYRIDIUM) 200 MG tablet Take 1 tablet (200 mg total) by mouth 3 (three) times daily as needed for pain. 01/13/17 01/13/18  Bartholomew Crews, MD  potassium chloride SA (K-DUR,KLOR-CON) 20 MEQ tablet TAKE 2 TABLETS BY MOUTH DAILY Patient not taking: Reported on 01/07/2017 05/26/16   Bartholomew Crews, MD  PROAIR HFA 108 [phone] Base) MCG/ACT inhaler INHALE 2 PUFFS BY MOUTH EVERY 6 HOURS AS NEEDED FOR WHEEZING Patient not taking: Reported on 01/07/2017 11/12/16   Bartholomew Crews, MD  ranitidine (ZANTAC) 150 MG tablet Take 1 tablet (150 mg total) by mouth 2 (two) times daily. Patient not taking: Reported on 01/07/2017 07/23/16   Bartholomew Crews, MD  rosuvastatin (CRESTOR) 20 MG tablet Take 20 mg by mouth at bedtime. 11/26/16   Historical Provider, MD  sertraline (ZOLOFT) 100 MG tablet Take 1 tablet (100 mg total) by mouth daily. Patient not taking: Reported on 01/07/2017 05/26/16   Bartholomew Crews, MD  traZODone (DESYREL) 100 MG tablet TAKE 1 TABLET BY MOUTH DAILY AT BEDTIME AS NEEDED FOR SLEEP Patient not taking: Reported on 01/07/2017 07/23/16   Bartholomew Crews, MD    Family History Family History  Problem Relation Age of Onset  . Diverticulosis Mother   .  Diabetes Mother   . Hypertension Mother   . Congestive Heart Failure Mother   . Asthma Father   . CAD Sister 80    MI at age 4 per patient.  However, she has not had a stent or CABG.   . Heart disease Sister     before age 35    Social History Social History  Substance Use Topics  . Smoking status: Current Every Day Smoker    Packs/day: 0.50    Years: 44.00    Types: Cigarettes  . Smokeless tobacco: Never Used     Comment: 1PPD  . Alcohol use No     Allergies   Abilify [aripiprazole]; Iohexol; Ivp dye [iodinated diagnostic agents]; and Morphine sulfate   Review of Systems Review of Systems  Constitutional: Negative for chills and fever.  Gastrointestinal: Negative for nausea.  Skin: Positive for color change and wound.     Physical Exam Updated Vital Signs BP (!) 176/49 (BP Location: Right Wrist)   Pulse 72   Resp 20   SpO2 97%   Physical Exam  Constitutional: She is oriented to person, place, and time. She appears well-developed and well-nourished.  Eyes: EOM are normal.  Neck: Neck supple.  Cardiovascular: Normal rate.   Pulmonary/Chest: Effort normal.  Abdominal: Soft. There is no tenderness.  Musculoskeletal: Normal range of motion.  Neurological: She is alert and oriented to person, place, and time. No cranial nerve deficit.  Skin: Skin is warm and dry.  Raised tender area to the chest between the breasts with erythema, no red streaking.   Psychiatric: She has a normal mood and affect. Her behavior is normal.  Nursing note and vitals reviewed.    ED Treatments / Results  DIAGNOSTIC STUDIES: Oxygen Saturation is 97% on RA, normal by my interpretation.   COORDINATION OF CARE: 8:04 PM- Will I & D abscess. Pt verbalizes understanding and agrees to plan.  INCISION AND DRAINAGE PROCEDURE NOTE: Patient identification was confirmed and verbal consent was obtained. This procedure was performed by Debroah Baller, FNP at 8:34 PM.  Site: chest wall between  right and left breats Sterile procedures observed Needle size: 25 G Anesthetic used (type and amt): Lidocaine 1% without Epinephrine (4 mLs) Blade size: 11 Drainage: moderate purulent, bloody drainage Complexity: Complex Packing used: 1/4" Iodoform  Site anesthetized, incision made over site, wound drained and explored loculations, rinsed with copious amounts of normal saline, packed with 1/4" Iodoform packing, covered with dry, sterile dressing. Pt tolerated procedure well without complications. Instructions for care discussed verbally and pt provided with additional written instructions for homecare and f/u.   Medications  lidocaine (PF) (XYLOCAINE) 1 % injection 5 mL (5 mLs Infiltration Given 01/24/17 2056)    Labs (all labs ordered are listed, but only abnormal results are displayed) Labs Reviewed - No data to display  Radiology No results found.  Procedures Procedures (including critical care time)  Medications Ordered in ED Medications  lidocaine (PF) (XYLOCAINE) 1 % injection 5 mL (5 mLs Infiltration Given 01/24/17 2056)     Initial Impression / Assessment and Plan / ED Course  I have reviewed the triage vital signs and the nursing notes.     Patient with skin abscess. Incision and drainage performed in the ED today. Wound recheck and packing removal in 2 days. Supportive care and return precautions discussed. Pt instructed to continue the antibiotics she is already currently taking. The patient appears reasonably screened and/or stabilized for discharge and I doubt any other emergent medical condition requiring further screening, evaluation, or treatment in the ED prior to discharge.  I personally performed the services described in this documentation, which was scribed in my presence. The recorded information has been reviewed and is accurate.   Final Clinical Impressions(s) / ED Diagnoses   Final diagnoses:  Abscess    New Prescriptions Discharge Medication List  as of 01/24/2017  8:47 PM       [facility], NP 01/25/17 2138    Quintella Reichert, MD 01/27/17 1037

## 2017-01-24 NOTE — ED Triage Notes (Signed)
Pt presents with c/o skin problem. She noticed a painful red sore under the crease of her R breast today when she was bending forward. She denies any drainage and reports pain at the site.

## 2017-01-24 NOTE — Telephone Encounter (Signed)
   Reason for call:   I received a call from [name] at approximately 5:00 PM indicating that she felt a lump on her chest.   Pertinent Data:   Patient reports that she has felt a painful, hard, non-mobile lump on the right side of her breastplate. She feels the area is warm to touch and about the size of a nickel.   She has not noticed any drainage from the area or have fevers or chills.  She has not noticed any insect or bug bite to the area.  She denies trauma to the area.   Assessment / Plan / Recommendations:   Difficult to assess patient's lump without evaluation, however would be concerned about abscess vs folliculitis vs soft tissue swelling or nodule.  I have advised patient to call our clinic on Monday during operating hours for an appointment, however she has elected to go to the ED as she reports that she would be unable to tolerate the pain further.   Zada Finders, MD   01/24/2017, 6:43 PM

## 2017-01-24 NOTE — Discharge Instructions (Signed)
Follow up in 2 days  with your doctor or return here for recheck and packing removal. Continue your antibiotics.

## 2017-01-24 NOTE — ED Notes (Signed)
Pt verbalized understanding discharge instructions and denies any further needs or questions at this time. VS stable, ambulatory and steady gait.   

## 2017-01-26 ENCOUNTER — Ambulatory Visit (INDEPENDENT_AMBULATORY_CARE_PROVIDER_SITE_OTHER): Payer: Medicare Other | Admitting: Pulmonary Disease

## 2017-01-26 DIAGNOSIS — Z5189 Encounter for other specified aftercare: Secondary | ICD-10-CM

## 2017-01-26 DIAGNOSIS — L02213 Cutaneous abscess of chest wall: Secondary | ICD-10-CM | POA: Diagnosis not present

## 2017-01-26 DIAGNOSIS — Z9889 Other specified postprocedural states: Secondary | ICD-10-CM

## 2017-01-26 DIAGNOSIS — L0291 Cutaneous abscess, unspecified: Secondary | ICD-10-CM

## 2017-01-26 DIAGNOSIS — B9689 Other specified bacterial agents as the cause of diseases classified elsewhere: Secondary | ICD-10-CM | POA: Diagnosis not present

## 2017-01-26 MED ORDER — DOXYCYCLINE HYCLATE 100 MG PO CAPS: 100.0000 mg | ORAL_CAPSULE | Freq: Two times a day (BID) | ORAL | 0 refills | Status: DC

## 2017-01-26 NOTE — Progress Notes (Signed)
CC: chest abscess   HPI:  [name] is a 56 y.o. with history as outlined below presenting for follow up of chest skin abscess. It is located under her right chest. She wen to the ED on 2/3 and they did an I&D.    Past Medical History:  Diagnosis Date  . Anginal pain [facility])    '3' of 10 ischemia ruled out 9/9   . Arthritis of lumbar spine ([place])   . Asthma   . CHF (congestive heart failure) ([place])   . 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 Chronic bronchitis ([place])    "I get it alot" (09/28/2013)  . Chronic diastolic heart failure (HCC)    grade 2 per 2D echocardiogram (01/2013)  . Chronic lower back pain   . Chronic osteomyelitis of foot (HCC)    chronic, right secondary to diabetic foot ulcers  . Chronic pain syndrome 12/03/2011   Likely secondary to depression, "fibromyalgia", neuropathy, and obesity. Lumbar MRI 2014 no sig change from prior (2008) : Stable hypertrophic facet disease most notable at L4-5. Stable shallow left foraminal/extraforaminal disc protrusion at L4-5. No direct neural compression.      Marland Kitchen COPD 01/08/2007   PFT's 05/2007 : FEV1/FVC 82, FEV1 64% pred, FEF 25-75% 40% predicted, 16% improvement in FEV1 with bronchodilators.     . Depression   . Diabetic peripheral neuropathy ([place])   . DVT of upper extremity (deep vein thrombosis) ([place]) 03/11/2013   Secondary to PICC line. Right brachial vein, diagnosed on 03/10/2013 Coumadin for 3 months. End date 06/10/2013   . Environmental allergies    Hx: of  . Exertional shortness of breath   . Fatty liver 2003   observed on ultrasound abdomen  . Fibromyalgia   . GERD (gastroesophageal reflux disease)   . Glaucoma   . Hyperlipidemia   . Hyperplastic colon polyp 12/2010   Per colonoscopy (12/2010) - Dr. Deatra Ina  . Hypertension   . 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 Blood cultures 01/26/13 S. Aureus and GBS. Blood cultures Feb 6th, 8th,  and 9th and March were negative.Repeat TEE 3/20 negative for vegitations  . 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 2/2 tobacco use, DM, and cont weight bearing on surgical wound and developed gangrene   . Polymicrobial bacterial infection 01/2013   GBS and S. aureus bacteremia // Source likely infected diabetic foot ulcer  . PVD (peripheral vascular disease) with claudication ([place])    Stents to bilateral common iliac arteries (left 2005, right 2008), on chronic plavix  . Rheumatoid arthritis ([place])   . Tobacco abuse   . Type II diabetes mellitus with peripheral circulatory disorders, uncontrolled DX: 1993   Insulin dep. Poor control. Complicated by diabetic foot ulcer and diabetic eye disease.    Marland Kitchen Ulcer of foot, chronic (HCC)    Left. No OM per MRI (01/2013)    Review of Systems:   Some subjective fevers No nausea/vomiting  Physical Exam:  Vitals:   01/26/17 1532  BP: 131/65  Pulse: 65  Temp: 98.5 F (36.9 C)  TempSrc: Oral  SpO2: 96%  Weight: 275 lb 1.6 oz (124.8 kg)  Height: 5\' 7"  (1.702 m)   General Apperance: NAD HEENT: Normocephalic, atraumatic, anicteric sclera Neck: Supple, trachea midline Lungs: Clear to auscultation bilaterally. Heart: Regular rate and rhythm Abdomen: Soft, nontender, nondistended  Extremities: +LLE amputation Skin: 1cm incision from I&D right midsternal with mild induration surrounding, no purulent drainage or fluctuance Neurologic: Alert and interactive. No gross deficits.  Assessment & Plan:   See Encounters Tab for problem based charting.  Patient discussed with Dr. Angelia Mould

## 2017-01-26 NOTE — Patient Instructions (Addendum)
Take doxycycline twice a day for next five days. Change gauze daily and as needed when it gets soiled. Remove packing in 2 days. You may shower and let warm soapy water run over the wound Keep wound and surrounding area clean Let us know if the swelling, redness or warmth increases or if you have worsening symptoms or new symptoms

## 2017-01-26 NOTE — Assessment & Plan Note (Addendum)
Assessment: Chest wall abscess on right of sternal close to right breast fold. Underwent I&D 2 days ago. Packing removed. No purulent drainage but does have some mild induration surrounding. She is out of her doxycycline and Augmentin she was on for her foot.  Plan: Repacked wound. Instructed patient to pull packing in 2 days.  Local wound care with keeping it clean and dry, can let warm soapy water run over. Doxycycline 100mg  BID for 5 days Instructed patient to call if she has worsening of her wound or develops symptoms of systemic infection Follow up in 4 weeks otherwise.

## 2017-01-27 ENCOUNTER — Other Ambulatory Visit: Payer: Self-pay

## 2017-01-27 ENCOUNTER — Other Ambulatory Visit: Payer: Self-pay | Admitting: Internal Medicine

## 2017-01-27 NOTE — Progress Notes (Signed)
Internal Medicine Clinic Attending  Case discussed with Dr. Krall at the time of the visit.  We reviewed the resident's history and exam and pertinent patient test results.  I agree with the assessment, diagnosis, and plan of care documented in the resident's note.  

## 2017-01-27 NOTE — Patient Outreach (Signed)
[facility]) Care Management  [place]  01/27/2017   Jennifer [name] 1961-04-04 [id number]  Subjective: Home visit completed with patient. Patient reports that she is in a lot of pain today. Reports that her right foot continues to be painful post-operatively and rates pain in foot 10+/10. Stated that she is still taking her Percocet every 4 hours and has recently gotten it filled.   Objective: Blood pressure (!) 150/70, pulse 70, resp. rate 20, SpO2 95 %.  Encounter Medications:  Outpatient Encounter Prescriptions as of 01/27/2017  Medication Sig  . ADVAIR DISKUS 250-50 MCG/DOSE AEPB TAKE 1 INHALATION BY MOUTH TWICE DAILY (Patient not taking: Reported on 01/07/2017)  . albuterol (PROVENTIL) (2.5 MG/3ML) 0.083% nebulizer solution INHALE THE CONTENTS OF 1 VIAL VIA NEBULIZER EVERY 6 HOURS AS NEEDED FOR WHEEZING (Patient not taking: Reported on 01/07/2017)  . amLODipine (NORVASC) 10 MG tablet TAKE 1 TABLET BY MOUTH EVERY DAY (Patient not taking: Reported on 01/07/2017)  . atenolol (TENORMIN) 100 MG tablet Take 1 tablet (100 mg total) by mouth daily. (Patient not taking: Reported on 01/07/2017)  . baclofen (LIORESAL) 10 MG tablet Take 1 tablet (10 mg total) by mouth 3 (three) times daily as needed for muscle spasms. Reported on 06/27/2016 (Patient not taking: Reported on 01/07/2017)  . benazepril (LOTENSIN) 40 MG tablet Take 40 mg by mouth daily.  Marland Kitchen doxycycline (VIBRAMYCIN) 100 MG capsule Take 1 capsule (100 mg total) by mouth 2 (two) times daily.  Marland Kitchen EASY TOUCH PEN NEEDLES 31G X 8 MM MISC USE TO INJECT INSULIN TWICE DAILY  . furosemide (LASIX) 40 MG tablet Take 1 tablet (40 mg total) by mouth daily as needed. (Patient not taking: Reported on 01/07/2017)  . hydrochlorothiazide (HYDRODIURIL) 25 MG tablet Take 1 tablet (25 mg total) by mouth daily. (Patient not taking: Reported on 01/07/2017)  . Insulin Lispro Prot & Lispro (HUMALOG MIX 75/25 KWIKPEN) (75-25) 100 UNIT/ML Kwikpen  Inject 140 units subcutaneously before breakfast and 85 units before supper (Patient not taking: Reported on 01/07/2017)  . ipratropium (ATROVENT) 0.02 % nebulizer solution USE 1 VIAL VIA NEBULIZER EVERY 6 HOURS AS NEEDED FOR WHEEZING (Patient not taking: Reported on 01/07/2017)  . loperamide (IMODIUM) 2 MG capsule Take 1 capsule (2 mg total) by mouth as needed for diarrhea or loose stools.  Marland Kitchen LYRICA 200 MG capsule TAKE 1 CAPSULE BY MOUTH THREE TIMES A DAY (Patient not taking: Reported on 01/07/2017)  . metFORMIN (GLUCOPHAGE XR) 500 MG 24 hr tablet Take 1 tablet (500 mg total) by mouth daily with breakfast. (Patient not taking: Reported on 01/07/2017)  . nystatin (MYCOSTATIN) powder APPLY TO AFFECTED AREA THREE TIMES A DAY (Patient not taking: Reported on 01/07/2017)  . ONETOUCH DELICA LANCETS 56C MISC USE TO CHECK BLOOD SUGARS FOUR TIMES A DAY  . ONETOUCH VERIO test strip USE 3-4 TIMES DAILY TO CHECK BLOOD SUGAR  . oxyCODONE-acetaminophen (PERCOCET/ROXICET) 5-325 MG tablet Take 1-2 tablets by mouth every 4 (four) hours as needed for severe pain.  . pantoprazole (PROTONIX) 40 MG tablet TAKE 1 TABLET (40 MG TOTAL) BY MOUTH DAILY. (Patient not taking: Reported on 01/07/2017)  . phenazopyridine (PYRIDIUM) 200 MG tablet Take 1 tablet (200 mg total) by mouth 3 (three) times daily as needed for pain.  . potassium chloride SA (K-DUR,KLOR-CON) 20 MEQ tablet TAKE 2 TABLETS BY MOUTH DAILY (Patient not taking: Reported on 01/07/2017)  . PROAIR HFA 108 (90 Base) MCG/ACT inhaler INHALE 2 PUFFS BY MOUTH EVERY 6 HOURS  AS NEEDED FOR WHEEZING (Patient not taking: Reported on 01/07/2017)  . ranitidine (ZANTAC) 150 MG tablet Take 1 tablet (150 mg total) by mouth 2 (two) times daily. (Patient not taking: Reported on 01/07/2017)  . rosuvastatin (CRESTOR) 20 MG tablet Take 20 mg by mouth at bedtime.  . sertraline (ZOLOFT) 100 MG tablet Take 1 tablet (100 mg total) by mouth daily. (Patient not taking: Reported on 01/07/2017)  .  traZODone (DESYREL) 100 MG tablet TAKE 1 TABLET BY MOUTH DAILY AT BEDTIME AS NEEDED FOR SLEEP (Patient not taking: Reported on 01/07/2017)   No facility-administered encounter medications on file as of 01/27/2017.     Functional Status:  In your present state of health, do you have any difficulty performing the following activities: 01/26/2017 01/13/2017  Hearing? N N  Vision? N N  Difficulty concentrating or making decisions? N N  Walking or climbing stairs? Y Y  Dressing or bathing? N N  Doing errands, shopping? N Y  Conservation officer, nature and eating ? - -  Using the Toilet? - -  In the past six months, have you accidently leaked urine? - -  Do you have problems with loss of bowel control? - -  Managing your Medications? - -  Managing your Finances? - -  Housekeeping or managing your Housekeeping? - -  Some recent data might be hidden    Fall/Depression Screening: PHQ 2/9 Scores 01/26/2017 01/13/2017 01/01/2017 12/30/2016 12/12/2016 12/02/2016 11/28/2016  PHQ - 2 Score 0 0 0 - _0 PHQ- 9 Score - - - - _1 Exception Documentation Patient refusal Patient refusal - Patient refusal - - -    Assessment: Patient generally not feeling well today due to continued pain in her right foot post-operatively. She continues to take her oxycodone (2 tabs every 4 hours) and reports minimal relief. She stated that she still has the dressing on her foot from her hospital admission. She stated that her appointment was canceled on 1/31/018, but not clear on if patient was the one to cancel the appointment or not. She stated that she had talked with Dr. Amalia Hailey office and they indicated it was ok to leave the dressing on until she sees him. Her rescheduled appointment is tomorrow, 01/28/2017. RNCM reminded patient of importance of attending all appointments. Patient stated that she has already arranged transportation with [facility] for this appointment.   Patient still has not resumed home health services due to waiting for  appointment with Dr. Amalia Hailey to perform initial dressing change. She stated that physical therapy had called to resume services. She reported that in the hospital, they had wanted her to go to rehab for therapy, but she declined.   Patient reported that [place] had contacted her about the electric scooter/wheelchair and they have worked out a plan where she can rent to own it with her insurance. She patient stated that this is more reasonable than paying the whole copay up front. She reported that she would be paying $69 per month for 8 or 9 months and then she would own the chair.   Patient reported that she was in the emergency department on 01/24/2017 for an abcess on her chest. She reported that she felt a large knot in the center of her chest below her breasts that was painful. She reported that it limited her ability to bend. She stated that in the emergency department, they cut it open and drained it. She currently has a dressing over  the wound and is keeping it clean and dry. She stated that she saw Dr. Randell Patient yesterday for a follow up and she re-packed the wound and applied the current dressing. She stated that she is to take the packing out tomorrow and she would she would not need to re-pack it. She stated she is applying new gauze dressing daily and as needed and reported that she has supplies do complete this. RNCM provided education regarding signs and symptoms of infection to monitor for including fever, change to pain at wound site, increased drainage, streaks or reddened areas and warmth to area. Patient verbalized understanding.   Patient stated that she currently has all medications she is supposed to be taking and is taking them as prescribed. She stated that she did not get the pyridium as she felt she did not need it.    Plan:  [facility] CM Care Plan Problem One   Flowsheet Row Most Recent Value  Care Plan Problem One  Recent hospital admission for COPD  Role Documenting the  Problem One  Care Management [place] for Problem One  Active  [facility] Long Term Goal (31-90 days)  Patient will report no readmission related to COPD in the next 60 days.  [facility] Long Term Goal Start Date  12/01/16  Interventions for Problem One Long Term Goal  Encouraged patient to attend follow up appointments.  Placed call to [place] today to see if patient can be seen today for a dressing change.   [facility] CM Short Term Goal #1 (0-30 days)  Patient will report attending hospital follow up within the next 10 days.    THN CM Short Term Goal #1 Start Date  12/01/16  [facility] CM Short Term Goal #1 Met Date  12/16/16  Interventions for Short Term Goal #1  Ecouraged patient to attend all MD appointments.  [facility] CM Short Term Goal #2 (0-30 days)  Patient will report taking all her medications as prescribed in the next 30 days.  [facility] CM Short Term Goal #2 Start Date  12/01/16  [facility] CM Short Term Goal #2 Met Date  01/20/16  Interventions for Short Term Goal #2  Reviewed with patient the importance of taking all medications as prescribed.,     [facility] CM Care Plan Problem Two   Flowsheet Row Most Recent Value  Care Plan Problem Two  Risk for readmission due to recent hospitalization for osteomylitis and toe amputation  Role Documenting the Problem Two  Care Management Coordinator  Care Plan for Problem Two  Active  Interventions for Problem Two Long Term Goal   RNCM educated on importance of attending all appointments, reporting any changes in her wound at home, taking all medications as prescribed  [facility]N Long Term Goal (31-90) days  Patient will not readmit to the hospital within the next 31 days for post operative complications related to osteomyelitis and toe amputation  THN Long Term Goal Start Date  01/19/17  [facility] CM Short Term Goal #1 (0-30 days)  Patient will take all medications as prescribed for the next 30 days  [facility]N CM Short Term Goal #1 Start Date  01/19/17  Interventions for Short Term Goal #2    RNCM educated patient on importance of taking prescribed antibiotics, pain management and all other medications for optimal health management  THN CM Short Term Goal #2 (0-30 days)  Patient will attend all scheduled post-operative appointments within the next 30 days  [facility]N CM Short Term Goal #2 Start Date  01/19/17  Interventions for Short Term Goal #2  RNCM educated on importance of attending all scheduled appointments and ensured patient has transportation to appointments     RNCM will follow up with patient next week for transition of care and assess for attendance of appointments, especially for wound care. Patient still has RNCM contact information and was encouraged to call with any questions or concerns.   [place] R. Kaceton Vieau, RN, BSN, [place] Management Coordinator [phone]

## 2017-01-28 ENCOUNTER — Ambulatory Visit (INDEPENDENT_AMBULATORY_CARE_PROVIDER_SITE_OTHER): Payer: Medicare Other

## 2017-01-28 ENCOUNTER — Encounter: Payer: Self-pay | Admitting: Podiatry

## 2017-01-28 ENCOUNTER — Ambulatory Visit (INDEPENDENT_AMBULATORY_CARE_PROVIDER_SITE_OTHER): Payer: Medicare Other | Admitting: Podiatry

## 2017-01-28 VITALS — BP 159/67 | HR 67 | Temp 96.8°F

## 2017-01-28 DIAGNOSIS — Z9889 Other specified postprocedural states: Secondary | ICD-10-CM

## 2017-01-28 DIAGNOSIS — J449 Chronic obstructive pulmonary disease, unspecified: Secondary | ICD-10-CM | POA: Diagnosis not present

## 2017-01-28 DIAGNOSIS — B951 Streptococcus, group B, as the cause of diseases classified elsewhere: Secondary | ICD-10-CM | POA: Diagnosis not present

## 2017-01-28 DIAGNOSIS — M86471 Chronic osteomyelitis with draining sinus, right ankle and foot: Secondary | ICD-10-CM | POA: Diagnosis not present

## 2017-01-28 DIAGNOSIS — A4902 Methicillin resistant Staphylococcus aureus infection, unspecified site: Secondary | ICD-10-CM | POA: Diagnosis not present

## 2017-01-28 DIAGNOSIS — L03119 Cellulitis of unspecified part of limb: Secondary | ICD-10-CM | POA: Diagnosis not present

## 2017-01-28 DIAGNOSIS — I38 Endocarditis, valve unspecified: Secondary | ICD-10-CM | POA: Diagnosis not present

## 2017-01-29 ENCOUNTER — Telehealth: Payer: Self-pay | Admitting: *Deleted

## 2017-01-29 DIAGNOSIS — I152 Hypertension secondary to endocrine disorders: Secondary | ICD-10-CM | POA: Diagnosis not present

## 2017-01-29 DIAGNOSIS — Z4781 Encounter for orthopedic aftercare following surgical amputation: Secondary | ICD-10-CM | POA: Diagnosis not present

## 2017-01-29 DIAGNOSIS — E1142 Type 2 diabetes mellitus with diabetic polyneuropathy: Secondary | ICD-10-CM | POA: Diagnosis not present

## 2017-01-29 DIAGNOSIS — E1159 Type 2 diabetes mellitus with other circulatory complications: Secondary | ICD-10-CM | POA: Diagnosis not present

## 2017-01-29 DIAGNOSIS — G894 Chronic pain syndrome: Secondary | ICD-10-CM | POA: Diagnosis not present

## 2017-01-29 DIAGNOSIS — J441 Chronic obstructive pulmonary disease with (acute) exacerbation: Secondary | ICD-10-CM | POA: Diagnosis not present

## 2017-01-29 DIAGNOSIS — I5032 Chronic diastolic (congestive) heart failure: Secondary | ICD-10-CM | POA: Diagnosis not present

## 2017-01-29 DIAGNOSIS — E785 Hyperlipidemia, unspecified: Secondary | ICD-10-CM | POA: Diagnosis not present

## 2017-01-29 DIAGNOSIS — E11319 Type 2 diabetes mellitus with unspecified diabetic retinopathy without macular edema: Secondary | ICD-10-CM | POA: Diagnosis not present

## 2017-01-29 DIAGNOSIS — H409 Unspecified glaucoma: Secondary | ICD-10-CM | POA: Diagnosis not present

## 2017-01-29 DIAGNOSIS — E1139 Type 2 diabetes mellitus with other diabetic ophthalmic complication: Secondary | ICD-10-CM | POA: Diagnosis not present

## 2017-01-29 DIAGNOSIS — E1121 Type 2 diabetes mellitus with diabetic nephropathy: Secondary | ICD-10-CM | POA: Diagnosis not present

## 2017-01-29 DIAGNOSIS — E1151 Type 2 diabetes mellitus with diabetic peripheral angiopathy without gangrene: Secondary | ICD-10-CM | POA: Diagnosis not present

## 2017-01-29 NOTE — Telephone Encounter (Addendum)
-----   Message from Edrick Kins, DPM sent at 01/28/2017 11:20 AM EST ----- Regarding: [place] Orders Please reach out to [facility] to resume home health care orders... Every other day x 6 weeks.    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   1. Cleanse with wound cleanser or normal saline.  Dry.  2. Paint incision site with betadine.  3. Dress with nonaderant telfa, 4x4 gauze, kerlex, light ace wrap or coban.   Thanks, Dr. Amalia Hailey. 01/29/2017-Faxed orders to [facility].

## 2017-01-30 ENCOUNTER — Telehealth: Payer: Self-pay

## 2017-01-30 DIAGNOSIS — E1151 Type 2 diabetes mellitus with diabetic peripheral angiopathy without gangrene: Secondary | ICD-10-CM | POA: Diagnosis not present

## 2017-01-30 DIAGNOSIS — E1121 Type 2 diabetes mellitus with diabetic nephropathy: Secondary | ICD-10-CM | POA: Diagnosis not present

## 2017-01-30 DIAGNOSIS — I152 Hypertension secondary to endocrine disorders: Secondary | ICD-10-CM | POA: Diagnosis not present

## 2017-01-30 DIAGNOSIS — Z4781 Encounter for orthopedic aftercare following surgical amputation: Secondary | ICD-10-CM | POA: Diagnosis not present

## 2017-01-30 DIAGNOSIS — I38 Endocarditis, valve unspecified: Secondary | ICD-10-CM | POA: Diagnosis not present

## 2017-01-30 DIAGNOSIS — E11319 Type 2 diabetes mellitus with unspecified diabetic retinopathy without macular edema: Secondary | ICD-10-CM | POA: Diagnosis not present

## 2017-01-30 DIAGNOSIS — E1142 Type 2 diabetes mellitus with diabetic polyneuropathy: Secondary | ICD-10-CM | POA: Diagnosis not present

## 2017-01-30 DIAGNOSIS — J449 Chronic obstructive pulmonary disease, unspecified: Secondary | ICD-10-CM | POA: Diagnosis not present

## 2017-01-30 DIAGNOSIS — E785 Hyperlipidemia, unspecified: Secondary | ICD-10-CM | POA: Diagnosis not present

## 2017-01-30 DIAGNOSIS — G894 Chronic pain syndrome: Secondary | ICD-10-CM | POA: Diagnosis not present

## 2017-01-30 DIAGNOSIS — E1139 Type 2 diabetes mellitus with other diabetic ophthalmic complication: Secondary | ICD-10-CM | POA: Diagnosis not present

## 2017-01-30 DIAGNOSIS — I5032 Chronic diastolic (congestive) heart failure: Secondary | ICD-10-CM | POA: Diagnosis not present

## 2017-01-30 DIAGNOSIS — H409 Unspecified glaucoma: Secondary | ICD-10-CM | POA: Diagnosis not present

## 2017-01-30 DIAGNOSIS — E1159 Type 2 diabetes mellitus with other circulatory complications: Secondary | ICD-10-CM | POA: Diagnosis not present

## 2017-01-30 DIAGNOSIS — J441 Chronic obstructive pulmonary disease with (acute) exacerbation: Secondary | ICD-10-CM | POA: Diagnosis not present

## 2017-01-30 NOTE — Telephone Encounter (Signed)
Lm for rtc 

## 2017-01-30 NOTE — Telephone Encounter (Signed)
VO resume PT  1x week for 1 week 2x week for 8 week Given, do you approve?

## 2017-01-30 NOTE — Telephone Encounter (Signed)
Jennifer [name] from [facility] requesting VO. Please call back.

## 2017-01-30 NOTE — Telephone Encounter (Signed)
OK with me.

## 2017-02-03 DIAGNOSIS — E1159 Type 2 diabetes mellitus with other circulatory complications: Secondary | ICD-10-CM | POA: Diagnosis not present

## 2017-02-03 DIAGNOSIS — J441 Chronic obstructive pulmonary disease with (acute) exacerbation: Secondary | ICD-10-CM | POA: Diagnosis not present

## 2017-02-03 DIAGNOSIS — I5032 Chronic diastolic (congestive) heart failure: Secondary | ICD-10-CM | POA: Diagnosis not present

## 2017-02-03 DIAGNOSIS — E1142 Type 2 diabetes mellitus with diabetic polyneuropathy: Secondary | ICD-10-CM | POA: Diagnosis not present

## 2017-02-03 DIAGNOSIS — Z4781 Encounter for orthopedic aftercare following surgical amputation: Secondary | ICD-10-CM | POA: Diagnosis not present

## 2017-02-03 DIAGNOSIS — E1139 Type 2 diabetes mellitus with other diabetic ophthalmic complication: Secondary | ICD-10-CM | POA: Diagnosis not present

## 2017-02-03 DIAGNOSIS — E1151 Type 2 diabetes mellitus with diabetic peripheral angiopathy without gangrene: Secondary | ICD-10-CM | POA: Diagnosis not present

## 2017-02-03 DIAGNOSIS — E1121 Type 2 diabetes mellitus with diabetic nephropathy: Secondary | ICD-10-CM | POA: Diagnosis not present

## 2017-02-03 DIAGNOSIS — I152 Hypertension secondary to endocrine disorders: Secondary | ICD-10-CM | POA: Diagnosis not present

## 2017-02-03 DIAGNOSIS — G894 Chronic pain syndrome: Secondary | ICD-10-CM | POA: Diagnosis not present

## 2017-02-03 DIAGNOSIS — H409 Unspecified glaucoma: Secondary | ICD-10-CM | POA: Diagnosis not present

## 2017-02-03 DIAGNOSIS — E785 Hyperlipidemia, unspecified: Secondary | ICD-10-CM | POA: Diagnosis not present

## 2017-02-03 DIAGNOSIS — E11319 Type 2 diabetes mellitus with unspecified diabetic retinopathy without macular edema: Secondary | ICD-10-CM | POA: Diagnosis not present

## 2017-02-04 ENCOUNTER — Ambulatory Visit (INDEPENDENT_AMBULATORY_CARE_PROVIDER_SITE_OTHER): Payer: Medicare Other | Admitting: Podiatry

## 2017-02-04 DIAGNOSIS — Z9889 Other specified postprocedural states: Secondary | ICD-10-CM

## 2017-02-04 NOTE — Progress Notes (Signed)
   Subjective: Patient presents today status post partial fifth ray amputation right foot. Patient is doing very well. She denies pain. Minimal edema.  Objective: Skin incisions are well coapted sutures are intact. The amputation site appears to be healthy and viable with routine healing.  Assessment: Status post partial fifth ray amputation right foot. Date of surgery 01/15/2017.  Plan of care: Today the patient was evaluated. Dressings were changed. Sutures were removed. Initiate home health dressing changes every other day. Return to clinic in 2 weeks

## 2017-02-05 DIAGNOSIS — E785 Hyperlipidemia, unspecified: Secondary | ICD-10-CM | POA: Diagnosis not present

## 2017-02-05 DIAGNOSIS — H409 Unspecified glaucoma: Secondary | ICD-10-CM | POA: Diagnosis not present

## 2017-02-05 DIAGNOSIS — E1159 Type 2 diabetes mellitus with other circulatory complications: Secondary | ICD-10-CM | POA: Diagnosis not present

## 2017-02-05 DIAGNOSIS — E1139 Type 2 diabetes mellitus with other diabetic ophthalmic complication: Secondary | ICD-10-CM | POA: Diagnosis not present

## 2017-02-05 DIAGNOSIS — E1121 Type 2 diabetes mellitus with diabetic nephropathy: Secondary | ICD-10-CM | POA: Diagnosis not present

## 2017-02-05 DIAGNOSIS — G894 Chronic pain syndrome: Secondary | ICD-10-CM | POA: Diagnosis not present

## 2017-02-05 DIAGNOSIS — I152 Hypertension secondary to endocrine disorders: Secondary | ICD-10-CM | POA: Diagnosis not present

## 2017-02-05 DIAGNOSIS — E11319 Type 2 diabetes mellitus with unspecified diabetic retinopathy without macular edema: Secondary | ICD-10-CM | POA: Diagnosis not present

## 2017-02-05 DIAGNOSIS — E1151 Type 2 diabetes mellitus with diabetic peripheral angiopathy without gangrene: Secondary | ICD-10-CM | POA: Diagnosis not present

## 2017-02-05 DIAGNOSIS — J441 Chronic obstructive pulmonary disease with (acute) exacerbation: Secondary | ICD-10-CM | POA: Diagnosis not present

## 2017-02-05 DIAGNOSIS — Z4781 Encounter for orthopedic aftercare following surgical amputation: Secondary | ICD-10-CM | POA: Diagnosis not present

## 2017-02-05 DIAGNOSIS — I5032 Chronic diastolic (congestive) heart failure: Secondary | ICD-10-CM | POA: Diagnosis not present

## 2017-02-05 DIAGNOSIS — E1142 Type 2 diabetes mellitus with diabetic polyneuropathy: Secondary | ICD-10-CM | POA: Diagnosis not present

## 2017-02-05 NOTE — Telephone Encounter (Addendum)
-----   Message from Edrick Kins, DPM sent at 02/04/2017 12:58 PM EST ----- Regarding: Re-initiate home health dressing changes Home health dressing changes right foot 3x/week x 6 weeks.  Dx : s/p partial fifth ray amputation right foot.   Cleanse with normal saline. Pain incision site with betadine. Dress with nonadherant gauze, 4x4 gauze, lightly wrapped kerlex, and ace wrap or coban.   Thanks, Dr. Amalia Hailey. 02/05/2017-Faxed re-instatement orders to [facility].

## 2017-02-06 ENCOUNTER — Other Ambulatory Visit: Payer: Self-pay

## 2017-02-06 ENCOUNTER — Ambulatory Visit: Payer: Self-pay

## 2017-02-06 DIAGNOSIS — Z4781 Encounter for orthopedic aftercare following surgical amputation: Secondary | ICD-10-CM | POA: Diagnosis not present

## 2017-02-06 DIAGNOSIS — I152 Hypertension secondary to endocrine disorders: Secondary | ICD-10-CM | POA: Diagnosis not present

## 2017-02-06 DIAGNOSIS — E1142 Type 2 diabetes mellitus with diabetic polyneuropathy: Secondary | ICD-10-CM | POA: Diagnosis not present

## 2017-02-06 DIAGNOSIS — E1139 Type 2 diabetes mellitus with other diabetic ophthalmic complication: Secondary | ICD-10-CM | POA: Diagnosis not present

## 2017-02-06 DIAGNOSIS — J441 Chronic obstructive pulmonary disease with (acute) exacerbation: Secondary | ICD-10-CM | POA: Diagnosis not present

## 2017-02-06 DIAGNOSIS — G894 Chronic pain syndrome: Secondary | ICD-10-CM | POA: Diagnosis not present

## 2017-02-06 DIAGNOSIS — E1121 Type 2 diabetes mellitus with diabetic nephropathy: Secondary | ICD-10-CM | POA: Diagnosis not present

## 2017-02-06 DIAGNOSIS — E11319 Type 2 diabetes mellitus with unspecified diabetic retinopathy without macular edema: Secondary | ICD-10-CM | POA: Diagnosis not present

## 2017-02-06 DIAGNOSIS — E785 Hyperlipidemia, unspecified: Secondary | ICD-10-CM | POA: Diagnosis not present

## 2017-02-06 DIAGNOSIS — E1159 Type 2 diabetes mellitus with other circulatory complications: Secondary | ICD-10-CM | POA: Diagnosis not present

## 2017-02-06 DIAGNOSIS — E1151 Type 2 diabetes mellitus with diabetic peripheral angiopathy without gangrene: Secondary | ICD-10-CM | POA: Diagnosis not present

## 2017-02-06 DIAGNOSIS — I5032 Chronic diastolic (congestive) heart failure: Secondary | ICD-10-CM | POA: Diagnosis not present

## 2017-02-06 DIAGNOSIS — H409 Unspecified glaucoma: Secondary | ICD-10-CM | POA: Diagnosis not present

## 2017-02-06 NOTE — Patient Outreach (Signed)
[facility]) Care Management   02/06/17  Jennifer [name]  [birth year]  [id number]  RNCM attempted to reach patient without success. Left HIPAA compliant voicemail with RNCM contact information and invited patient to call back. Will follow up again within the next week.  [place] R. Lacora Folmer, RN, BSN, [place] Management Coordinator [phone]

## 2017-02-08 DIAGNOSIS — E1151 Type 2 diabetes mellitus with diabetic peripheral angiopathy without gangrene: Secondary | ICD-10-CM | POA: Diagnosis not present

## 2017-02-08 DIAGNOSIS — J441 Chronic obstructive pulmonary disease with (acute) exacerbation: Secondary | ICD-10-CM | POA: Diagnosis not present

## 2017-02-08 DIAGNOSIS — E1159 Type 2 diabetes mellitus with other circulatory complications: Secondary | ICD-10-CM | POA: Diagnosis not present

## 2017-02-08 DIAGNOSIS — E785 Hyperlipidemia, unspecified: Secondary | ICD-10-CM | POA: Diagnosis not present

## 2017-02-08 DIAGNOSIS — Z4781 Encounter for orthopedic aftercare following surgical amputation: Secondary | ICD-10-CM | POA: Diagnosis not present

## 2017-02-08 DIAGNOSIS — E1139 Type 2 diabetes mellitus with other diabetic ophthalmic complication: Secondary | ICD-10-CM | POA: Diagnosis not present

## 2017-02-08 DIAGNOSIS — E1142 Type 2 diabetes mellitus with diabetic polyneuropathy: Secondary | ICD-10-CM | POA: Diagnosis not present

## 2017-02-08 DIAGNOSIS — G894 Chronic pain syndrome: Secondary | ICD-10-CM | POA: Diagnosis not present

## 2017-02-08 DIAGNOSIS — E1121 Type 2 diabetes mellitus with diabetic nephropathy: Secondary | ICD-10-CM | POA: Diagnosis not present

## 2017-02-08 DIAGNOSIS — I5032 Chronic diastolic (congestive) heart failure: Secondary | ICD-10-CM | POA: Diagnosis not present

## 2017-02-08 DIAGNOSIS — I152 Hypertension secondary to endocrine disorders: Secondary | ICD-10-CM | POA: Diagnosis not present

## 2017-02-08 DIAGNOSIS — E11319 Type 2 diabetes mellitus with unspecified diabetic retinopathy without macular edema: Secondary | ICD-10-CM | POA: Diagnosis not present

## 2017-02-08 DIAGNOSIS — H409 Unspecified glaucoma: Secondary | ICD-10-CM | POA: Diagnosis not present

## 2017-02-09 NOTE — Progress Notes (Signed)
Subjective: Patient with a history of diabetes mellitus and below-knee amputation the left lower extremity presents today status post partial fifth ray amputation to the right foot. Date of surgery 01/15/2017. Patient states that she's doing well. Patient denies pain.  Objective: Skin incision is well coapted with sutures intact. There appears to be routine healing to the amputation site. No sign of infectious process noted. Radiographic exam demonstrates a stable osteotomy of the fifth ray metatarsal.  Assessment: Status post partial fifth ray amputation right foot. Date of surgery 01/15/2017. History of below-knee amputation left lower extremity History of chronic osteomyelitis fifth digit right foot  Plan of care: Today the patient was evaluated. Dressings were changed. Orders for home health care were placed. Return to clinic in 1 week.

## 2017-02-10 DIAGNOSIS — E1121 Type 2 diabetes mellitus with diabetic nephropathy: Secondary | ICD-10-CM | POA: Diagnosis not present

## 2017-02-10 DIAGNOSIS — E1159 Type 2 diabetes mellitus with other circulatory complications: Secondary | ICD-10-CM | POA: Diagnosis not present

## 2017-02-10 DIAGNOSIS — J441 Chronic obstructive pulmonary disease with (acute) exacerbation: Secondary | ICD-10-CM | POA: Diagnosis not present

## 2017-02-10 DIAGNOSIS — Z4781 Encounter for orthopedic aftercare following surgical amputation: Secondary | ICD-10-CM | POA: Diagnosis not present

## 2017-02-10 DIAGNOSIS — E11319 Type 2 diabetes mellitus with unspecified diabetic retinopathy without macular edema: Secondary | ICD-10-CM | POA: Diagnosis not present

## 2017-02-10 DIAGNOSIS — I5032 Chronic diastolic (congestive) heart failure: Secondary | ICD-10-CM | POA: Diagnosis not present

## 2017-02-10 DIAGNOSIS — I152 Hypertension secondary to endocrine disorders: Secondary | ICD-10-CM | POA: Diagnosis not present

## 2017-02-10 DIAGNOSIS — H409 Unspecified glaucoma: Secondary | ICD-10-CM | POA: Diagnosis not present

## 2017-02-10 DIAGNOSIS — E1151 Type 2 diabetes mellitus with diabetic peripheral angiopathy without gangrene: Secondary | ICD-10-CM | POA: Diagnosis not present

## 2017-02-10 DIAGNOSIS — G894 Chronic pain syndrome: Secondary | ICD-10-CM | POA: Diagnosis not present

## 2017-02-10 DIAGNOSIS — E1139 Type 2 diabetes mellitus with other diabetic ophthalmic complication: Secondary | ICD-10-CM | POA: Diagnosis not present

## 2017-02-10 DIAGNOSIS — E1142 Type 2 diabetes mellitus with diabetic polyneuropathy: Secondary | ICD-10-CM | POA: Diagnosis not present

## 2017-02-10 DIAGNOSIS — E785 Hyperlipidemia, unspecified: Secondary | ICD-10-CM | POA: Diagnosis not present

## 2017-02-12 DIAGNOSIS — E1142 Type 2 diabetes mellitus with diabetic polyneuropathy: Secondary | ICD-10-CM | POA: Diagnosis not present

## 2017-02-12 DIAGNOSIS — H409 Unspecified glaucoma: Secondary | ICD-10-CM | POA: Diagnosis not present

## 2017-02-12 DIAGNOSIS — J441 Chronic obstructive pulmonary disease with (acute) exacerbation: Secondary | ICD-10-CM | POA: Diagnosis not present

## 2017-02-12 DIAGNOSIS — E785 Hyperlipidemia, unspecified: Secondary | ICD-10-CM | POA: Diagnosis not present

## 2017-02-12 DIAGNOSIS — Z4781 Encounter for orthopedic aftercare following surgical amputation: Secondary | ICD-10-CM | POA: Diagnosis not present

## 2017-02-12 DIAGNOSIS — G894 Chronic pain syndrome: Secondary | ICD-10-CM | POA: Diagnosis not present

## 2017-02-12 DIAGNOSIS — E1159 Type 2 diabetes mellitus with other circulatory complications: Secondary | ICD-10-CM | POA: Diagnosis not present

## 2017-02-12 DIAGNOSIS — I152 Hypertension secondary to endocrine disorders: Secondary | ICD-10-CM | POA: Diagnosis not present

## 2017-02-12 DIAGNOSIS — I5032 Chronic diastolic (congestive) heart failure: Secondary | ICD-10-CM | POA: Diagnosis not present

## 2017-02-12 DIAGNOSIS — E1151 Type 2 diabetes mellitus with diabetic peripheral angiopathy without gangrene: Secondary | ICD-10-CM | POA: Diagnosis not present

## 2017-02-12 DIAGNOSIS — E11319 Type 2 diabetes mellitus with unspecified diabetic retinopathy without macular edema: Secondary | ICD-10-CM | POA: Diagnosis not present

## 2017-02-12 DIAGNOSIS — E1139 Type 2 diabetes mellitus with other diabetic ophthalmic complication: Secondary | ICD-10-CM | POA: Diagnosis not present

## 2017-02-12 DIAGNOSIS — E1121 Type 2 diabetes mellitus with diabetic nephropathy: Secondary | ICD-10-CM | POA: Diagnosis not present

## 2017-02-13 ENCOUNTER — Other Ambulatory Visit: Payer: Self-pay

## 2017-02-13 NOTE — Patient Outreach (Signed)
[facility]) Care Management  02/13/17  Jennifer [name] [birth year] [id number]  Second attempt to reach patient without success. Left HIPAA compliant voicemail with RNCM contact information and invited callback.  Jennifer R. Tequila Rottmann, RN, BSN, [place] Management Coordinator [phone]

## 2017-02-13 NOTE — Patient Outreach (Signed)
[facility] [facility]) Care Management  02/13/17  Jennifer [name] April 13, 1961  [id number]  RNCM received voicemail from patient returning call.   RNCM attempted to reach patient without success. Phone did not ring and went straight to voicemail. Left HIPAA compliant voicemail with RNCM contact information and invited to callback.  [place] R. Ladamien Rammel, RN, BSN, [place] Management Coordinator [phone]

## 2017-02-16 DIAGNOSIS — Z4781 Encounter for orthopedic aftercare following surgical amputation: Secondary | ICD-10-CM | POA: Diagnosis not present

## 2017-02-16 DIAGNOSIS — E1159 Type 2 diabetes mellitus with other circulatory complications: Secondary | ICD-10-CM | POA: Diagnosis not present

## 2017-02-16 DIAGNOSIS — I5032 Chronic diastolic (congestive) heart failure: Secondary | ICD-10-CM | POA: Diagnosis not present

## 2017-02-16 DIAGNOSIS — E11319 Type 2 diabetes mellitus with unspecified diabetic retinopathy without macular edema: Secondary | ICD-10-CM | POA: Diagnosis not present

## 2017-02-16 DIAGNOSIS — J441 Chronic obstructive pulmonary disease with (acute) exacerbation: Secondary | ICD-10-CM | POA: Diagnosis not present

## 2017-02-16 DIAGNOSIS — G894 Chronic pain syndrome: Secondary | ICD-10-CM | POA: Diagnosis not present

## 2017-02-16 DIAGNOSIS — I152 Hypertension secondary to endocrine disorders: Secondary | ICD-10-CM | POA: Diagnosis not present

## 2017-02-16 DIAGNOSIS — H409 Unspecified glaucoma: Secondary | ICD-10-CM | POA: Diagnosis not present

## 2017-02-16 DIAGNOSIS — E1142 Type 2 diabetes mellitus with diabetic polyneuropathy: Secondary | ICD-10-CM | POA: Diagnosis not present

## 2017-02-16 DIAGNOSIS — E1151 Type 2 diabetes mellitus with diabetic peripheral angiopathy without gangrene: Secondary | ICD-10-CM | POA: Diagnosis not present

## 2017-02-16 DIAGNOSIS — E1139 Type 2 diabetes mellitus with other diabetic ophthalmic complication: Secondary | ICD-10-CM | POA: Diagnosis not present

## 2017-02-16 DIAGNOSIS — E785 Hyperlipidemia, unspecified: Secondary | ICD-10-CM | POA: Diagnosis not present

## 2017-02-16 DIAGNOSIS — E1121 Type 2 diabetes mellitus with diabetic nephropathy: Secondary | ICD-10-CM | POA: Diagnosis not present

## 2017-02-17 DIAGNOSIS — E785 Hyperlipidemia, unspecified: Secondary | ICD-10-CM | POA: Diagnosis not present

## 2017-02-17 DIAGNOSIS — E1151 Type 2 diabetes mellitus with diabetic peripheral angiopathy without gangrene: Secondary | ICD-10-CM | POA: Diagnosis not present

## 2017-02-17 DIAGNOSIS — E1139 Type 2 diabetes mellitus with other diabetic ophthalmic complication: Secondary | ICD-10-CM | POA: Diagnosis not present

## 2017-02-17 DIAGNOSIS — H409 Unspecified glaucoma: Secondary | ICD-10-CM | POA: Diagnosis not present

## 2017-02-17 DIAGNOSIS — Z4781 Encounter for orthopedic aftercare following surgical amputation: Secondary | ICD-10-CM | POA: Diagnosis not present

## 2017-02-17 DIAGNOSIS — J441 Chronic obstructive pulmonary disease with (acute) exacerbation: Secondary | ICD-10-CM | POA: Diagnosis not present

## 2017-02-17 DIAGNOSIS — E1142 Type 2 diabetes mellitus with diabetic polyneuropathy: Secondary | ICD-10-CM | POA: Diagnosis not present

## 2017-02-17 DIAGNOSIS — I152 Hypertension secondary to endocrine disorders: Secondary | ICD-10-CM | POA: Diagnosis not present

## 2017-02-17 DIAGNOSIS — E11319 Type 2 diabetes mellitus with unspecified diabetic retinopathy without macular edema: Secondary | ICD-10-CM | POA: Diagnosis not present

## 2017-02-17 DIAGNOSIS — E1121 Type 2 diabetes mellitus with diabetic nephropathy: Secondary | ICD-10-CM | POA: Diagnosis not present

## 2017-02-17 DIAGNOSIS — I5032 Chronic diastolic (congestive) heart failure: Secondary | ICD-10-CM | POA: Diagnosis not present

## 2017-02-17 DIAGNOSIS — E1159 Type 2 diabetes mellitus with other circulatory complications: Secondary | ICD-10-CM | POA: Diagnosis not present

## 2017-02-17 DIAGNOSIS — G894 Chronic pain syndrome: Secondary | ICD-10-CM | POA: Diagnosis not present

## 2017-02-18 ENCOUNTER — Ambulatory Visit (INDEPENDENT_AMBULATORY_CARE_PROVIDER_SITE_OTHER): Payer: Medicare Other | Admitting: Podiatry

## 2017-02-18 DIAGNOSIS — E1143 Type 2 diabetes mellitus with diabetic autonomic (poly)neuropathy: Secondary | ICD-10-CM | POA: Diagnosis not present

## 2017-02-18 DIAGNOSIS — L97509 Non-pressure chronic ulcer of other part of unspecified foot with unspecified severity: Secondary | ICD-10-CM | POA: Diagnosis not present

## 2017-02-18 DIAGNOSIS — L97512 Non-pressure chronic ulcer of other part of right foot with fat layer exposed: Secondary | ICD-10-CM

## 2017-02-18 DIAGNOSIS — I70235 Atherosclerosis of native arteries of right leg with ulceration of other part of foot: Secondary | ICD-10-CM | POA: Diagnosis not present

## 2017-02-18 DIAGNOSIS — E088 Diabetes mellitus due to underlying condition with unspecified complications: Secondary | ICD-10-CM | POA: Diagnosis not present

## 2017-02-18 DIAGNOSIS — Z9889 Other specified postprocedural states: Secondary | ICD-10-CM

## 2017-02-18 DIAGNOSIS — E0843 Diabetes mellitus due to underlying condition with diabetic autonomic (poly)neuropathy: Secondary | ICD-10-CM | POA: Diagnosis not present

## 2017-02-18 DIAGNOSIS — E08621 Diabetes mellitus due to underlying condition with foot ulcer: Secondary | ICD-10-CM

## 2017-02-18 NOTE — Progress Notes (Signed)
Subjective: Patient presents today status post partial fifth ray amputation right foot. Patient states that she's doing very well. She denies pain. Minimal edema noted. Next  Objective: Skin incisions well coapted sutures intact. Upon debridement of the overlying eschar there is a small dehiscence noted to the mid substance of the incision site measuring approximately [phone] cm (LxWxD)  To the noted ulceration there iseschar. There is a minimal amount of slough fibrin and necrotic tissue noted. Granulation tissue is red. There is healthy bleeding after debridement. Periwound integrity is intact. No malodor. There is no exposed bone muscle-tendon ligament or joint.  Assessment: Status post partial fifth ray amputation right foot. Date of surgery 01/15/2017. Ulcer right foot secondary to diabetes mellitus  Plan of care: Today the patient was evaluated. Medically necessary excisional debridement including subcutaneous tissues performed to the surgical dehiscence site using a tissue nipper. Excisional debridement of all necrotic nonviable tissue down to healthy bleeding viable tissue was performed with post-debridement measurements and was pre-. Today we are going to adjust the patient's home health dressing change orders to 3 times per week. Application of Prisma and aquacel Ag with DSD.  Return to clinic in 2 weeks

## 2017-02-19 ENCOUNTER — Telehealth: Payer: Self-pay | Admitting: *Deleted

## 2017-02-19 DIAGNOSIS — E785 Hyperlipidemia, unspecified: Secondary | ICD-10-CM | POA: Diagnosis not present

## 2017-02-19 DIAGNOSIS — H409 Unspecified glaucoma: Secondary | ICD-10-CM | POA: Diagnosis not present

## 2017-02-19 DIAGNOSIS — J441 Chronic obstructive pulmonary disease with (acute) exacerbation: Secondary | ICD-10-CM | POA: Diagnosis not present

## 2017-02-19 DIAGNOSIS — E1159 Type 2 diabetes mellitus with other circulatory complications: Secondary | ICD-10-CM | POA: Diagnosis not present

## 2017-02-19 DIAGNOSIS — G894 Chronic pain syndrome: Secondary | ICD-10-CM | POA: Diagnosis not present

## 2017-02-19 DIAGNOSIS — E1151 Type 2 diabetes mellitus with diabetic peripheral angiopathy without gangrene: Secondary | ICD-10-CM | POA: Diagnosis not present

## 2017-02-19 DIAGNOSIS — E11319 Type 2 diabetes mellitus with unspecified diabetic retinopathy without macular edema: Secondary | ICD-10-CM | POA: Diagnosis not present

## 2017-02-19 DIAGNOSIS — E1142 Type 2 diabetes mellitus with diabetic polyneuropathy: Secondary | ICD-10-CM | POA: Diagnosis not present

## 2017-02-19 DIAGNOSIS — I152 Hypertension secondary to endocrine disorders: Secondary | ICD-10-CM | POA: Diagnosis not present

## 2017-02-19 DIAGNOSIS — E1139 Type 2 diabetes mellitus with other diabetic ophthalmic complication: Secondary | ICD-10-CM | POA: Diagnosis not present

## 2017-02-19 DIAGNOSIS — E1121 Type 2 diabetes mellitus with diabetic nephropathy: Secondary | ICD-10-CM | POA: Diagnosis not present

## 2017-02-19 DIAGNOSIS — I5032 Chronic diastolic (congestive) heart failure: Secondary | ICD-10-CM | POA: Diagnosis not present

## 2017-02-19 DIAGNOSIS — Z4781 Encounter for orthopedic aftercare following surgical amputation: Secondary | ICD-10-CM | POA: Diagnosis not present

## 2017-02-19 NOTE — Telephone Encounter (Addendum)
-----   Message from Edrick Kins, DPM sent at 02/18/2017  8:28 PM EST ----- Regarding: Please modify Home health dressing change orders Dressing changes 3x/week x 4 weeks right foot ulceration.  Dx : s/p partial fifth ray amputation with dehiscence right foot. Diabetes mellitus.  - cleanse area with normal saline. Dry.  - Applied moistened Puricol Ag to ulceration.  - Dress with aquacel ag, and light dry sterile dressing.  Thanks, Dr. Amalia Hailey. 02/19/2017-Faxed copy of 02/18/2017 8:28pm orders to [facility]. I read Dr. Amalia Hailey orders to [place] at [facility] and told her I faxed a copy of the orders to her home office. She asked if they needed to continue to wrap pts legs for compression and I told her if the legs are still swollen he would want them wrapped. 02/23/2017-Ann - [facility] states will use the silver alginate, but the other will not be in until Friday. Ann asked if pt's leg still need to be triple layer leg wraps. 02/24/2017-I asked Ann - [facility] if they had Prisma and she states they do now and will proceed with dressing changes as ordered by Dr. Amalia Hailey. I told Lelon Frohlich I did not see that Dr.Evans had ordered the triple wrap for the leg, and she should discuss with pt's internal medicine doctor, Dr. Zada Finders. Lelon Frohlich states she will check with Dr. Patel.03/17/2017-Charice - [facility] called for pt's weight bearing status.03/18/2017-Left message informing Charice - [facility] pt can weight bear in surgical shoe.03/23/2017-Scarlette - [facility] asked if Dr. Amalia Hailey would like to continue the triple wraps to pt's leg on the days she has dressing changes Monday, Wednesday, and Friday. I informed Scarlette that Dr. Amalia Hailey did not order the triple leg wraps. ,

## 2017-02-20 DIAGNOSIS — E1151 Type 2 diabetes mellitus with diabetic peripheral angiopathy without gangrene: Secondary | ICD-10-CM | POA: Diagnosis not present

## 2017-02-20 DIAGNOSIS — Z4781 Encounter for orthopedic aftercare following surgical amputation: Secondary | ICD-10-CM | POA: Diagnosis not present

## 2017-02-20 DIAGNOSIS — E11319 Type 2 diabetes mellitus with unspecified diabetic retinopathy without macular edema: Secondary | ICD-10-CM | POA: Diagnosis not present

## 2017-02-20 DIAGNOSIS — E1159 Type 2 diabetes mellitus with other circulatory complications: Secondary | ICD-10-CM | POA: Diagnosis not present

## 2017-02-20 DIAGNOSIS — H409 Unspecified glaucoma: Secondary | ICD-10-CM | POA: Diagnosis not present

## 2017-02-20 DIAGNOSIS — E1121 Type 2 diabetes mellitus with diabetic nephropathy: Secondary | ICD-10-CM | POA: Diagnosis not present

## 2017-02-20 DIAGNOSIS — J441 Chronic obstructive pulmonary disease with (acute) exacerbation: Secondary | ICD-10-CM | POA: Diagnosis not present

## 2017-02-20 DIAGNOSIS — E785 Hyperlipidemia, unspecified: Secondary | ICD-10-CM | POA: Diagnosis not present

## 2017-02-20 DIAGNOSIS — G894 Chronic pain syndrome: Secondary | ICD-10-CM | POA: Diagnosis not present

## 2017-02-20 DIAGNOSIS — E1139 Type 2 diabetes mellitus with other diabetic ophthalmic complication: Secondary | ICD-10-CM | POA: Diagnosis not present

## 2017-02-20 DIAGNOSIS — I5032 Chronic diastolic (congestive) heart failure: Secondary | ICD-10-CM | POA: Diagnosis not present

## 2017-02-20 DIAGNOSIS — E1142 Type 2 diabetes mellitus with diabetic polyneuropathy: Secondary | ICD-10-CM | POA: Diagnosis not present

## 2017-02-20 DIAGNOSIS — I152 Hypertension secondary to endocrine disorders: Secondary | ICD-10-CM | POA: Diagnosis not present

## 2017-02-23 ENCOUNTER — Encounter: Payer: Self-pay | Admitting: Internal Medicine

## 2017-02-23 DIAGNOSIS — E1159 Type 2 diabetes mellitus with other circulatory complications: Secondary | ICD-10-CM | POA: Diagnosis not present

## 2017-02-23 DIAGNOSIS — Z4781 Encounter for orthopedic aftercare following surgical amputation: Secondary | ICD-10-CM | POA: Diagnosis not present

## 2017-02-23 DIAGNOSIS — E1151 Type 2 diabetes mellitus with diabetic peripheral angiopathy without gangrene: Secondary | ICD-10-CM | POA: Diagnosis not present

## 2017-02-23 DIAGNOSIS — I5032 Chronic diastolic (congestive) heart failure: Secondary | ICD-10-CM | POA: Diagnosis not present

## 2017-02-23 DIAGNOSIS — H409 Unspecified glaucoma: Secondary | ICD-10-CM | POA: Diagnosis not present

## 2017-02-23 DIAGNOSIS — E11319 Type 2 diabetes mellitus with unspecified diabetic retinopathy without macular edema: Secondary | ICD-10-CM | POA: Diagnosis not present

## 2017-02-23 DIAGNOSIS — E1121 Type 2 diabetes mellitus with diabetic nephropathy: Secondary | ICD-10-CM | POA: Diagnosis not present

## 2017-02-23 DIAGNOSIS — E1142 Type 2 diabetes mellitus with diabetic polyneuropathy: Secondary | ICD-10-CM | POA: Diagnosis not present

## 2017-02-23 DIAGNOSIS — E1139 Type 2 diabetes mellitus with other diabetic ophthalmic complication: Secondary | ICD-10-CM | POA: Diagnosis not present

## 2017-02-23 DIAGNOSIS — G894 Chronic pain syndrome: Secondary | ICD-10-CM | POA: Diagnosis not present

## 2017-02-23 DIAGNOSIS — E785 Hyperlipidemia, unspecified: Secondary | ICD-10-CM | POA: Diagnosis not present

## 2017-02-23 DIAGNOSIS — I152 Hypertension secondary to endocrine disorders: Secondary | ICD-10-CM | POA: Diagnosis not present

## 2017-02-23 DIAGNOSIS — J441 Chronic obstructive pulmonary disease with (acute) exacerbation: Secondary | ICD-10-CM | POA: Diagnosis not present

## 2017-02-23 NOTE — Telephone Encounter (Signed)
Not sure what triple wrap is. They need to apply prisma and aquacel ag 2-3x/week until instructed otherwise.  Dr. Amalia Hailey

## 2017-02-25 DIAGNOSIS — Z4781 Encounter for orthopedic aftercare following surgical amputation: Secondary | ICD-10-CM | POA: Diagnosis not present

## 2017-02-25 DIAGNOSIS — E1151 Type 2 diabetes mellitus with diabetic peripheral angiopathy without gangrene: Secondary | ICD-10-CM | POA: Diagnosis not present

## 2017-02-25 DIAGNOSIS — E1139 Type 2 diabetes mellitus with other diabetic ophthalmic complication: Secondary | ICD-10-CM | POA: Diagnosis not present

## 2017-02-25 DIAGNOSIS — E785 Hyperlipidemia, unspecified: Secondary | ICD-10-CM | POA: Diagnosis not present

## 2017-02-25 DIAGNOSIS — I5032 Chronic diastolic (congestive) heart failure: Secondary | ICD-10-CM | POA: Diagnosis not present

## 2017-02-25 DIAGNOSIS — G894 Chronic pain syndrome: Secondary | ICD-10-CM | POA: Diagnosis not present

## 2017-02-25 DIAGNOSIS — E1159 Type 2 diabetes mellitus with other circulatory complications: Secondary | ICD-10-CM | POA: Diagnosis not present

## 2017-02-25 DIAGNOSIS — H409 Unspecified glaucoma: Secondary | ICD-10-CM | POA: Diagnosis not present

## 2017-02-25 DIAGNOSIS — E1142 Type 2 diabetes mellitus with diabetic polyneuropathy: Secondary | ICD-10-CM | POA: Diagnosis not present

## 2017-02-25 DIAGNOSIS — I152 Hypertension secondary to endocrine disorders: Secondary | ICD-10-CM | POA: Diagnosis not present

## 2017-02-25 DIAGNOSIS — J441 Chronic obstructive pulmonary disease with (acute) exacerbation: Secondary | ICD-10-CM | POA: Diagnosis not present

## 2017-02-25 DIAGNOSIS — E11319 Type 2 diabetes mellitus with unspecified diabetic retinopathy without macular edema: Secondary | ICD-10-CM | POA: Diagnosis not present

## 2017-02-25 DIAGNOSIS — E1121 Type 2 diabetes mellitus with diabetic nephropathy: Secondary | ICD-10-CM | POA: Diagnosis not present

## 2017-02-26 DIAGNOSIS — E1159 Type 2 diabetes mellitus with other circulatory complications: Secondary | ICD-10-CM | POA: Diagnosis not present

## 2017-02-26 DIAGNOSIS — E1121 Type 2 diabetes mellitus with diabetic nephropathy: Secondary | ICD-10-CM | POA: Diagnosis not present

## 2017-02-26 DIAGNOSIS — G894 Chronic pain syndrome: Secondary | ICD-10-CM | POA: Diagnosis not present

## 2017-02-26 DIAGNOSIS — E1139 Type 2 diabetes mellitus with other diabetic ophthalmic complication: Secondary | ICD-10-CM | POA: Diagnosis not present

## 2017-02-26 DIAGNOSIS — E1142 Type 2 diabetes mellitus with diabetic polyneuropathy: Secondary | ICD-10-CM | POA: Diagnosis not present

## 2017-02-26 DIAGNOSIS — E1151 Type 2 diabetes mellitus with diabetic peripheral angiopathy without gangrene: Secondary | ICD-10-CM | POA: Diagnosis not present

## 2017-02-26 DIAGNOSIS — I5032 Chronic diastolic (congestive) heart failure: Secondary | ICD-10-CM | POA: Diagnosis not present

## 2017-02-26 DIAGNOSIS — I152 Hypertension secondary to endocrine disorders: Secondary | ICD-10-CM | POA: Diagnosis not present

## 2017-02-26 DIAGNOSIS — Z4781 Encounter for orthopedic aftercare following surgical amputation: Secondary | ICD-10-CM | POA: Diagnosis not present

## 2017-02-26 DIAGNOSIS — E11319 Type 2 diabetes mellitus with unspecified diabetic retinopathy without macular edema: Secondary | ICD-10-CM | POA: Diagnosis not present

## 2017-02-26 DIAGNOSIS — H409 Unspecified glaucoma: Secondary | ICD-10-CM | POA: Diagnosis not present

## 2017-02-26 DIAGNOSIS — E785 Hyperlipidemia, unspecified: Secondary | ICD-10-CM | POA: Diagnosis not present

## 2017-02-26 DIAGNOSIS — J441 Chronic obstructive pulmonary disease with (acute) exacerbation: Secondary | ICD-10-CM | POA: Diagnosis not present

## 2017-02-27 DIAGNOSIS — E1121 Type 2 diabetes mellitus with diabetic nephropathy: Secondary | ICD-10-CM | POA: Diagnosis not present

## 2017-02-27 DIAGNOSIS — E1139 Type 2 diabetes mellitus with other diabetic ophthalmic complication: Secondary | ICD-10-CM | POA: Diagnosis not present

## 2017-02-27 DIAGNOSIS — E1142 Type 2 diabetes mellitus with diabetic polyneuropathy: Secondary | ICD-10-CM | POA: Diagnosis not present

## 2017-02-27 DIAGNOSIS — I5032 Chronic diastolic (congestive) heart failure: Secondary | ICD-10-CM | POA: Diagnosis not present

## 2017-02-27 DIAGNOSIS — G894 Chronic pain syndrome: Secondary | ICD-10-CM | POA: Diagnosis not present

## 2017-02-27 DIAGNOSIS — E1159 Type 2 diabetes mellitus with other circulatory complications: Secondary | ICD-10-CM | POA: Diagnosis not present

## 2017-02-27 DIAGNOSIS — H409 Unspecified glaucoma: Secondary | ICD-10-CM | POA: Diagnosis not present

## 2017-02-27 DIAGNOSIS — E785 Hyperlipidemia, unspecified: Secondary | ICD-10-CM | POA: Diagnosis not present

## 2017-02-27 DIAGNOSIS — J449 Chronic obstructive pulmonary disease, unspecified: Secondary | ICD-10-CM | POA: Diagnosis not present

## 2017-02-27 DIAGNOSIS — Z4781 Encounter for orthopedic aftercare following surgical amputation: Secondary | ICD-10-CM | POA: Diagnosis not present

## 2017-02-27 DIAGNOSIS — E1151 Type 2 diabetes mellitus with diabetic peripheral angiopathy without gangrene: Secondary | ICD-10-CM | POA: Diagnosis not present

## 2017-02-27 DIAGNOSIS — I38 Endocarditis, valve unspecified: Secondary | ICD-10-CM | POA: Diagnosis not present

## 2017-02-27 DIAGNOSIS — J441 Chronic obstructive pulmonary disease with (acute) exacerbation: Secondary | ICD-10-CM | POA: Diagnosis not present

## 2017-02-27 DIAGNOSIS — I152 Hypertension secondary to endocrine disorders: Secondary | ICD-10-CM | POA: Diagnosis not present

## 2017-02-27 DIAGNOSIS — E11319 Type 2 diabetes mellitus with unspecified diabetic retinopathy without macular edema: Secondary | ICD-10-CM | POA: Diagnosis not present

## 2017-03-02 DIAGNOSIS — E1151 Type 2 diabetes mellitus with diabetic peripheral angiopathy without gangrene: Secondary | ICD-10-CM | POA: Diagnosis not present

## 2017-03-02 DIAGNOSIS — E1139 Type 2 diabetes mellitus with other diabetic ophthalmic complication: Secondary | ICD-10-CM | POA: Diagnosis not present

## 2017-03-02 DIAGNOSIS — E785 Hyperlipidemia, unspecified: Secondary | ICD-10-CM | POA: Diagnosis not present

## 2017-03-02 DIAGNOSIS — E1121 Type 2 diabetes mellitus with diabetic nephropathy: Secondary | ICD-10-CM | POA: Diagnosis not present

## 2017-03-02 DIAGNOSIS — E1142 Type 2 diabetes mellitus with diabetic polyneuropathy: Secondary | ICD-10-CM | POA: Diagnosis not present

## 2017-03-02 DIAGNOSIS — I5032 Chronic diastolic (congestive) heart failure: Secondary | ICD-10-CM | POA: Diagnosis not present

## 2017-03-02 DIAGNOSIS — E11319 Type 2 diabetes mellitus with unspecified diabetic retinopathy without macular edema: Secondary | ICD-10-CM | POA: Diagnosis not present

## 2017-03-02 DIAGNOSIS — Z4781 Encounter for orthopedic aftercare following surgical amputation: Secondary | ICD-10-CM | POA: Diagnosis not present

## 2017-03-02 DIAGNOSIS — J441 Chronic obstructive pulmonary disease with (acute) exacerbation: Secondary | ICD-10-CM | POA: Diagnosis not present

## 2017-03-02 DIAGNOSIS — E1159 Type 2 diabetes mellitus with other circulatory complications: Secondary | ICD-10-CM | POA: Diagnosis not present

## 2017-03-02 DIAGNOSIS — G894 Chronic pain syndrome: Secondary | ICD-10-CM | POA: Diagnosis not present

## 2017-03-02 DIAGNOSIS — H409 Unspecified glaucoma: Secondary | ICD-10-CM | POA: Diagnosis not present

## 2017-03-02 DIAGNOSIS — I152 Hypertension secondary to endocrine disorders: Secondary | ICD-10-CM | POA: Diagnosis not present

## 2017-03-04 ENCOUNTER — Encounter: Payer: Self-pay | Admitting: Podiatry

## 2017-03-04 ENCOUNTER — Ambulatory Visit (INDEPENDENT_AMBULATORY_CARE_PROVIDER_SITE_OTHER): Payer: Medicare Other | Admitting: Podiatry

## 2017-03-04 DIAGNOSIS — L97512 Non-pressure chronic ulcer of other part of right foot with fat layer exposed: Secondary | ICD-10-CM

## 2017-03-04 DIAGNOSIS — Z9889 Other specified postprocedural states: Secondary | ICD-10-CM

## 2017-03-04 DIAGNOSIS — I70235 Atherosclerosis of native arteries of right leg with ulceration of other part of foot: Secondary | ICD-10-CM | POA: Diagnosis not present

## 2017-03-05 ENCOUNTER — Ambulatory Visit (HOSPITAL_BASED_OUTPATIENT_CLINIC_OR_DEPARTMENT_OTHER): Payer: Medicare Other

## 2017-03-06 DIAGNOSIS — Z4781 Encounter for orthopedic aftercare following surgical amputation: Secondary | ICD-10-CM | POA: Diagnosis not present

## 2017-03-06 DIAGNOSIS — G894 Chronic pain syndrome: Secondary | ICD-10-CM | POA: Diagnosis not present

## 2017-03-06 DIAGNOSIS — H409 Unspecified glaucoma: Secondary | ICD-10-CM | POA: Diagnosis not present

## 2017-03-06 DIAGNOSIS — I5032 Chronic diastolic (congestive) heart failure: Secondary | ICD-10-CM | POA: Diagnosis not present

## 2017-03-06 DIAGNOSIS — E785 Hyperlipidemia, unspecified: Secondary | ICD-10-CM | POA: Diagnosis not present

## 2017-03-06 DIAGNOSIS — E11319 Type 2 diabetes mellitus with unspecified diabetic retinopathy without macular edema: Secondary | ICD-10-CM | POA: Diagnosis not present

## 2017-03-06 DIAGNOSIS — E1139 Type 2 diabetes mellitus with other diabetic ophthalmic complication: Secondary | ICD-10-CM | POA: Diagnosis not present

## 2017-03-06 DIAGNOSIS — E1121 Type 2 diabetes mellitus with diabetic nephropathy: Secondary | ICD-10-CM | POA: Diagnosis not present

## 2017-03-06 DIAGNOSIS — I152 Hypertension secondary to endocrine disorders: Secondary | ICD-10-CM | POA: Diagnosis not present

## 2017-03-06 DIAGNOSIS — E1151 Type 2 diabetes mellitus with diabetic peripheral angiopathy without gangrene: Secondary | ICD-10-CM | POA: Diagnosis not present

## 2017-03-06 DIAGNOSIS — J441 Chronic obstructive pulmonary disease with (acute) exacerbation: Secondary | ICD-10-CM | POA: Diagnosis not present

## 2017-03-06 DIAGNOSIS — E1159 Type 2 diabetes mellitus with other circulatory complications: Secondary | ICD-10-CM | POA: Diagnosis not present

## 2017-03-06 DIAGNOSIS — E1142 Type 2 diabetes mellitus with diabetic polyneuropathy: Secondary | ICD-10-CM | POA: Diagnosis not present

## 2017-03-09 DIAGNOSIS — Z4781 Encounter for orthopedic aftercare following surgical amputation: Secondary | ICD-10-CM | POA: Diagnosis not present

## 2017-03-09 DIAGNOSIS — E11319 Type 2 diabetes mellitus with unspecified diabetic retinopathy without macular edema: Secondary | ICD-10-CM | POA: Diagnosis not present

## 2017-03-09 DIAGNOSIS — E1142 Type 2 diabetes mellitus with diabetic polyneuropathy: Secondary | ICD-10-CM | POA: Diagnosis not present

## 2017-03-09 DIAGNOSIS — I5032 Chronic diastolic (congestive) heart failure: Secondary | ICD-10-CM | POA: Diagnosis not present

## 2017-03-09 DIAGNOSIS — G894 Chronic pain syndrome: Secondary | ICD-10-CM | POA: Diagnosis not present

## 2017-03-09 DIAGNOSIS — I152 Hypertension secondary to endocrine disorders: Secondary | ICD-10-CM | POA: Diagnosis not present

## 2017-03-09 DIAGNOSIS — H409 Unspecified glaucoma: Secondary | ICD-10-CM | POA: Diagnosis not present

## 2017-03-09 DIAGNOSIS — E1121 Type 2 diabetes mellitus with diabetic nephropathy: Secondary | ICD-10-CM | POA: Diagnosis not present

## 2017-03-09 DIAGNOSIS — E1159 Type 2 diabetes mellitus with other circulatory complications: Secondary | ICD-10-CM | POA: Diagnosis not present

## 2017-03-09 DIAGNOSIS — E1151 Type 2 diabetes mellitus with diabetic peripheral angiopathy without gangrene: Secondary | ICD-10-CM | POA: Diagnosis not present

## 2017-03-09 DIAGNOSIS — E1139 Type 2 diabetes mellitus with other diabetic ophthalmic complication: Secondary | ICD-10-CM | POA: Diagnosis not present

## 2017-03-09 DIAGNOSIS — J441 Chronic obstructive pulmonary disease with (acute) exacerbation: Secondary | ICD-10-CM | POA: Diagnosis not present

## 2017-03-09 DIAGNOSIS — E785 Hyperlipidemia, unspecified: Secondary | ICD-10-CM | POA: Diagnosis not present

## 2017-03-10 ENCOUNTER — Other Ambulatory Visit: Payer: Self-pay

## 2017-03-10 DIAGNOSIS — F1120 Opioid dependence, uncomplicated: Secondary | ICD-10-CM

## 2017-03-10 DIAGNOSIS — Z79891 Long term (current) use of opiate analgesic: Secondary | ICD-10-CM

## 2017-03-10 NOTE — Telephone Encounter (Signed)
oxyCODONE-acetaminophen (PERCOCET/ROXICET) 5-325 MG tablet, refill request.  

## 2017-03-11 DIAGNOSIS — J441 Chronic obstructive pulmonary disease with (acute) exacerbation: Secondary | ICD-10-CM | POA: Diagnosis not present

## 2017-03-11 DIAGNOSIS — H409 Unspecified glaucoma: Secondary | ICD-10-CM | POA: Diagnosis not present

## 2017-03-11 DIAGNOSIS — E1142 Type 2 diabetes mellitus with diabetic polyneuropathy: Secondary | ICD-10-CM | POA: Diagnosis not present

## 2017-03-11 DIAGNOSIS — E785 Hyperlipidemia, unspecified: Secondary | ICD-10-CM | POA: Diagnosis not present

## 2017-03-11 DIAGNOSIS — Z4781 Encounter for orthopedic aftercare following surgical amputation: Secondary | ICD-10-CM | POA: Diagnosis not present

## 2017-03-11 DIAGNOSIS — I5032 Chronic diastolic (congestive) heart failure: Secondary | ICD-10-CM | POA: Diagnosis not present

## 2017-03-11 DIAGNOSIS — I152 Hypertension secondary to endocrine disorders: Secondary | ICD-10-CM | POA: Diagnosis not present

## 2017-03-11 DIAGNOSIS — E1151 Type 2 diabetes mellitus with diabetic peripheral angiopathy without gangrene: Secondary | ICD-10-CM | POA: Diagnosis not present

## 2017-03-11 DIAGNOSIS — E11319 Type 2 diabetes mellitus with unspecified diabetic retinopathy without macular edema: Secondary | ICD-10-CM | POA: Diagnosis not present

## 2017-03-11 DIAGNOSIS — E1139 Type 2 diabetes mellitus with other diabetic ophthalmic complication: Secondary | ICD-10-CM | POA: Diagnosis not present

## 2017-03-11 DIAGNOSIS — E1159 Type 2 diabetes mellitus with other circulatory complications: Secondary | ICD-10-CM | POA: Diagnosis not present

## 2017-03-11 DIAGNOSIS — G894 Chronic pain syndrome: Secondary | ICD-10-CM | POA: Diagnosis not present

## 2017-03-11 DIAGNOSIS — E1121 Type 2 diabetes mellitus with diabetic nephropathy: Secondary | ICD-10-CM | POA: Diagnosis not present

## 2017-03-11 MED ORDER — OXYCODONE-ACETAMINOPHEN 5-325 MG PO TABS: 1.0000 | ORAL_TABLET | ORAL | 0 refills | Status: DC | PRN

## 2017-03-11 NOTE — Assessment & Plan Note (Signed)
Since out from amputation, will resume very slow taper with goal to get to 240

## 2017-03-11 NOTE — Telephone Encounter (Signed)
Pt informed

## 2017-03-13 ENCOUNTER — Other Ambulatory Visit: Payer: Self-pay

## 2017-03-13 DIAGNOSIS — E1139 Type 2 diabetes mellitus with other diabetic ophthalmic complication: Secondary | ICD-10-CM | POA: Diagnosis not present

## 2017-03-13 DIAGNOSIS — E1121 Type 2 diabetes mellitus with diabetic nephropathy: Secondary | ICD-10-CM | POA: Diagnosis not present

## 2017-03-13 DIAGNOSIS — E1142 Type 2 diabetes mellitus with diabetic polyneuropathy: Secondary | ICD-10-CM | POA: Diagnosis not present

## 2017-03-13 DIAGNOSIS — I5032 Chronic diastolic (congestive) heart failure: Secondary | ICD-10-CM | POA: Diagnosis not present

## 2017-03-13 DIAGNOSIS — H409 Unspecified glaucoma: Secondary | ICD-10-CM | POA: Diagnosis not present

## 2017-03-13 DIAGNOSIS — I152 Hypertension secondary to endocrine disorders: Secondary | ICD-10-CM | POA: Diagnosis not present

## 2017-03-13 DIAGNOSIS — Z4781 Encounter for orthopedic aftercare following surgical amputation: Secondary | ICD-10-CM | POA: Diagnosis not present

## 2017-03-13 DIAGNOSIS — E1151 Type 2 diabetes mellitus with diabetic peripheral angiopathy without gangrene: Secondary | ICD-10-CM | POA: Diagnosis not present

## 2017-03-13 DIAGNOSIS — G894 Chronic pain syndrome: Secondary | ICD-10-CM | POA: Diagnosis not present

## 2017-03-13 DIAGNOSIS — E11319 Type 2 diabetes mellitus with unspecified diabetic retinopathy without macular edema: Secondary | ICD-10-CM | POA: Diagnosis not present

## 2017-03-13 DIAGNOSIS — E1159 Type 2 diabetes mellitus with other circulatory complications: Secondary | ICD-10-CM | POA: Diagnosis not present

## 2017-03-13 DIAGNOSIS — J441 Chronic obstructive pulmonary disease with (acute) exacerbation: Secondary | ICD-10-CM | POA: Diagnosis not present

## 2017-03-13 DIAGNOSIS — E785 Hyperlipidemia, unspecified: Secondary | ICD-10-CM | POA: Diagnosis not present

## 2017-03-13 NOTE — Patient Outreach (Signed)
[place] [facility]) Care Management  03/13/17  Jennifer [name] Jun 17, 1961 [id number]  Third and final outreach attempt completed without success. Left HIPAA compliant voicemail with RNCM contact information and requested callback.   Plan- RNCM will send outreach barrier letter and wait 10 business days before completing case closure if no return call has been received.  [name], RN, BSN, [place] Management Coordinator [phone]

## 2017-03-16 DIAGNOSIS — E1142 Type 2 diabetes mellitus with diabetic polyneuropathy: Secondary | ICD-10-CM | POA: Diagnosis not present

## 2017-03-16 DIAGNOSIS — I5032 Chronic diastolic (congestive) heart failure: Secondary | ICD-10-CM | POA: Diagnosis not present

## 2017-03-16 DIAGNOSIS — J441 Chronic obstructive pulmonary disease with (acute) exacerbation: Secondary | ICD-10-CM | POA: Diagnosis not present

## 2017-03-16 DIAGNOSIS — E785 Hyperlipidemia, unspecified: Secondary | ICD-10-CM | POA: Diagnosis not present

## 2017-03-16 DIAGNOSIS — I152 Hypertension secondary to endocrine disorders: Secondary | ICD-10-CM | POA: Diagnosis not present

## 2017-03-16 DIAGNOSIS — E11319 Type 2 diabetes mellitus with unspecified diabetic retinopathy without macular edema: Secondary | ICD-10-CM | POA: Diagnosis not present

## 2017-03-16 DIAGNOSIS — E1159 Type 2 diabetes mellitus with other circulatory complications: Secondary | ICD-10-CM | POA: Diagnosis not present

## 2017-03-16 DIAGNOSIS — E1139 Type 2 diabetes mellitus with other diabetic ophthalmic complication: Secondary | ICD-10-CM | POA: Diagnosis not present

## 2017-03-16 DIAGNOSIS — E1121 Type 2 diabetes mellitus with diabetic nephropathy: Secondary | ICD-10-CM | POA: Diagnosis not present

## 2017-03-16 DIAGNOSIS — E1151 Type 2 diabetes mellitus with diabetic peripheral angiopathy without gangrene: Secondary | ICD-10-CM | POA: Diagnosis not present

## 2017-03-16 DIAGNOSIS — Z4781 Encounter for orthopedic aftercare following surgical amputation: Secondary | ICD-10-CM | POA: Diagnosis not present

## 2017-03-16 DIAGNOSIS — H409 Unspecified glaucoma: Secondary | ICD-10-CM | POA: Diagnosis not present

## 2017-03-16 DIAGNOSIS — G894 Chronic pain syndrome: Secondary | ICD-10-CM | POA: Diagnosis not present

## 2017-03-16 NOTE — Progress Notes (Signed)
Subjective: Patient presents today status post partial fifth ray amputation right foot. Patient states that she's doing very well. She denies pain. Minimal edema noted. Next  Objective: Skin incisions well coapted sutures intact. Upon debridement of the overlying eschar there is a small dehiscence noted to the mid substance of the incision site measuring approximately [phone] cm (LxWxD)  To the noted ulceration there iseschar. There is a minimal amount of slough fibrin and necrotic tissue noted. Granulation tissue is red. There is healthy bleeding after debridement. Periwound integrity is intact. No malodor. There is no exposed bone muscle-tendon ligament or joint.  Assessment: Status post partial fifth ray amputation right foot. Date of surgery 01/15/2017. Ulcer right foot secondary to diabetes mellitus  Plan of care: Today the patient was evaluated. Medically necessary excisional debridement including subcutaneous tissues performed to the surgical dehiscence site using a tissue nipper. Excisional debridement of all necrotic nonviable tissue down to healthy bleeding viable tissue was performed with post-debridement measurements and was pre-. Dry sterile dressing was applied Continue home health dressing changes consisting of Prisma and Aquasol AG with dry sterile dressing. Return to clinic in 2 weeks

## 2017-03-17 DIAGNOSIS — J441 Chronic obstructive pulmonary disease with (acute) exacerbation: Secondary | ICD-10-CM | POA: Diagnosis not present

## 2017-03-17 DIAGNOSIS — E1159 Type 2 diabetes mellitus with other circulatory complications: Secondary | ICD-10-CM | POA: Diagnosis not present

## 2017-03-17 DIAGNOSIS — E1142 Type 2 diabetes mellitus with diabetic polyneuropathy: Secondary | ICD-10-CM | POA: Diagnosis not present

## 2017-03-17 DIAGNOSIS — E785 Hyperlipidemia, unspecified: Secondary | ICD-10-CM | POA: Diagnosis not present

## 2017-03-17 DIAGNOSIS — E1139 Type 2 diabetes mellitus with other diabetic ophthalmic complication: Secondary | ICD-10-CM | POA: Diagnosis not present

## 2017-03-17 DIAGNOSIS — I152 Hypertension secondary to endocrine disorders: Secondary | ICD-10-CM | POA: Diagnosis not present

## 2017-03-17 DIAGNOSIS — E1151 Type 2 diabetes mellitus with diabetic peripheral angiopathy without gangrene: Secondary | ICD-10-CM | POA: Diagnosis not present

## 2017-03-17 DIAGNOSIS — H409 Unspecified glaucoma: Secondary | ICD-10-CM | POA: Diagnosis not present

## 2017-03-17 DIAGNOSIS — I5032 Chronic diastolic (congestive) heart failure: Secondary | ICD-10-CM | POA: Diagnosis not present

## 2017-03-17 DIAGNOSIS — G894 Chronic pain syndrome: Secondary | ICD-10-CM | POA: Diagnosis not present

## 2017-03-17 DIAGNOSIS — E11319 Type 2 diabetes mellitus with unspecified diabetic retinopathy without macular edema: Secondary | ICD-10-CM | POA: Diagnosis not present

## 2017-03-17 DIAGNOSIS — E1121 Type 2 diabetes mellitus with diabetic nephropathy: Secondary | ICD-10-CM | POA: Diagnosis not present

## 2017-03-17 DIAGNOSIS — Z4781 Encounter for orthopedic aftercare following surgical amputation: Secondary | ICD-10-CM | POA: Diagnosis not present

## 2017-03-18 ENCOUNTER — Ambulatory Visit: Payer: Medicare Other | Admitting: Podiatry

## 2017-03-18 DIAGNOSIS — J441 Chronic obstructive pulmonary disease with (acute) exacerbation: Secondary | ICD-10-CM | POA: Diagnosis not present

## 2017-03-18 DIAGNOSIS — I5032 Chronic diastolic (congestive) heart failure: Secondary | ICD-10-CM | POA: Diagnosis not present

## 2017-03-18 DIAGNOSIS — E1159 Type 2 diabetes mellitus with other circulatory complications: Secondary | ICD-10-CM | POA: Diagnosis not present

## 2017-03-18 DIAGNOSIS — Z4781 Encounter for orthopedic aftercare following surgical amputation: Secondary | ICD-10-CM | POA: Diagnosis not present

## 2017-03-18 DIAGNOSIS — E785 Hyperlipidemia, unspecified: Secondary | ICD-10-CM | POA: Diagnosis not present

## 2017-03-18 DIAGNOSIS — E1151 Type 2 diabetes mellitus with diabetic peripheral angiopathy without gangrene: Secondary | ICD-10-CM | POA: Diagnosis not present

## 2017-03-18 DIAGNOSIS — E1139 Type 2 diabetes mellitus with other diabetic ophthalmic complication: Secondary | ICD-10-CM | POA: Diagnosis not present

## 2017-03-18 DIAGNOSIS — E1142 Type 2 diabetes mellitus with diabetic polyneuropathy: Secondary | ICD-10-CM | POA: Diagnosis not present

## 2017-03-18 DIAGNOSIS — H409 Unspecified glaucoma: Secondary | ICD-10-CM | POA: Diagnosis not present

## 2017-03-18 DIAGNOSIS — E1121 Type 2 diabetes mellitus with diabetic nephropathy: Secondary | ICD-10-CM | POA: Diagnosis not present

## 2017-03-18 DIAGNOSIS — I152 Hypertension secondary to endocrine disorders: Secondary | ICD-10-CM | POA: Diagnosis not present

## 2017-03-18 DIAGNOSIS — G894 Chronic pain syndrome: Secondary | ICD-10-CM | POA: Diagnosis not present

## 2017-03-18 DIAGNOSIS — E11319 Type 2 diabetes mellitus with unspecified diabetic retinopathy without macular edema: Secondary | ICD-10-CM | POA: Diagnosis not present

## 2017-03-18 NOTE — Telephone Encounter (Signed)
Weight bearing in postop shoe.  Dr. Amalia Hailey

## 2017-03-19 DIAGNOSIS — I152 Hypertension secondary to endocrine disorders: Secondary | ICD-10-CM | POA: Diagnosis not present

## 2017-03-19 DIAGNOSIS — E1139 Type 2 diabetes mellitus with other diabetic ophthalmic complication: Secondary | ICD-10-CM | POA: Diagnosis not present

## 2017-03-19 DIAGNOSIS — J441 Chronic obstructive pulmonary disease with (acute) exacerbation: Secondary | ICD-10-CM | POA: Diagnosis not present

## 2017-03-19 DIAGNOSIS — E1159 Type 2 diabetes mellitus with other circulatory complications: Secondary | ICD-10-CM | POA: Diagnosis not present

## 2017-03-19 DIAGNOSIS — E1121 Type 2 diabetes mellitus with diabetic nephropathy: Secondary | ICD-10-CM | POA: Diagnosis not present

## 2017-03-19 DIAGNOSIS — E11319 Type 2 diabetes mellitus with unspecified diabetic retinopathy without macular edema: Secondary | ICD-10-CM | POA: Diagnosis not present

## 2017-03-19 DIAGNOSIS — E1151 Type 2 diabetes mellitus with diabetic peripheral angiopathy without gangrene: Secondary | ICD-10-CM | POA: Diagnosis not present

## 2017-03-19 DIAGNOSIS — Z4781 Encounter for orthopedic aftercare following surgical amputation: Secondary | ICD-10-CM | POA: Diagnosis not present

## 2017-03-19 DIAGNOSIS — E1142 Type 2 diabetes mellitus with diabetic polyneuropathy: Secondary | ICD-10-CM | POA: Diagnosis not present

## 2017-03-19 DIAGNOSIS — H409 Unspecified glaucoma: Secondary | ICD-10-CM | POA: Diagnosis not present

## 2017-03-19 DIAGNOSIS — G894 Chronic pain syndrome: Secondary | ICD-10-CM | POA: Diagnosis not present

## 2017-03-19 DIAGNOSIS — I5032 Chronic diastolic (congestive) heart failure: Secondary | ICD-10-CM | POA: Diagnosis not present

## 2017-03-19 DIAGNOSIS — E785 Hyperlipidemia, unspecified: Secondary | ICD-10-CM | POA: Diagnosis not present

## 2017-03-20 DIAGNOSIS — J441 Chronic obstructive pulmonary disease with (acute) exacerbation: Secondary | ICD-10-CM | POA: Diagnosis not present

## 2017-03-20 DIAGNOSIS — E785 Hyperlipidemia, unspecified: Secondary | ICD-10-CM | POA: Diagnosis not present

## 2017-03-20 DIAGNOSIS — Z4781 Encounter for orthopedic aftercare following surgical amputation: Secondary | ICD-10-CM | POA: Diagnosis not present

## 2017-03-20 DIAGNOSIS — I5032 Chronic diastolic (congestive) heart failure: Secondary | ICD-10-CM | POA: Diagnosis not present

## 2017-03-20 DIAGNOSIS — E1139 Type 2 diabetes mellitus with other diabetic ophthalmic complication: Secondary | ICD-10-CM | POA: Diagnosis not present

## 2017-03-20 DIAGNOSIS — E1142 Type 2 diabetes mellitus with diabetic polyneuropathy: Secondary | ICD-10-CM | POA: Diagnosis not present

## 2017-03-20 DIAGNOSIS — E11319 Type 2 diabetes mellitus with unspecified diabetic retinopathy without macular edema: Secondary | ICD-10-CM | POA: Diagnosis not present

## 2017-03-20 DIAGNOSIS — E1151 Type 2 diabetes mellitus with diabetic peripheral angiopathy without gangrene: Secondary | ICD-10-CM | POA: Diagnosis not present

## 2017-03-20 DIAGNOSIS — E1121 Type 2 diabetes mellitus with diabetic nephropathy: Secondary | ICD-10-CM | POA: Diagnosis not present

## 2017-03-20 DIAGNOSIS — H409 Unspecified glaucoma: Secondary | ICD-10-CM | POA: Diagnosis not present

## 2017-03-20 DIAGNOSIS — I152 Hypertension secondary to endocrine disorders: Secondary | ICD-10-CM | POA: Diagnosis not present

## 2017-03-20 DIAGNOSIS — E1159 Type 2 diabetes mellitus with other circulatory complications: Secondary | ICD-10-CM | POA: Diagnosis not present

## 2017-03-20 DIAGNOSIS — G894 Chronic pain syndrome: Secondary | ICD-10-CM | POA: Diagnosis not present

## 2017-03-23 ENCOUNTER — Telehealth: Payer: Self-pay

## 2017-03-23 DIAGNOSIS — E1159 Type 2 diabetes mellitus with other circulatory complications: Secondary | ICD-10-CM | POA: Diagnosis not present

## 2017-03-23 DIAGNOSIS — E1121 Type 2 diabetes mellitus with diabetic nephropathy: Secondary | ICD-10-CM | POA: Diagnosis not present

## 2017-03-23 DIAGNOSIS — J441 Chronic obstructive pulmonary disease with (acute) exacerbation: Secondary | ICD-10-CM | POA: Diagnosis not present

## 2017-03-23 DIAGNOSIS — E785 Hyperlipidemia, unspecified: Secondary | ICD-10-CM | POA: Diagnosis not present

## 2017-03-23 DIAGNOSIS — E1139 Type 2 diabetes mellitus with other diabetic ophthalmic complication: Secondary | ICD-10-CM | POA: Diagnosis not present

## 2017-03-23 DIAGNOSIS — G894 Chronic pain syndrome: Secondary | ICD-10-CM | POA: Diagnosis not present

## 2017-03-23 DIAGNOSIS — I5032 Chronic diastolic (congestive) heart failure: Secondary | ICD-10-CM | POA: Diagnosis not present

## 2017-03-23 DIAGNOSIS — E1142 Type 2 diabetes mellitus with diabetic polyneuropathy: Secondary | ICD-10-CM | POA: Diagnosis not present

## 2017-03-23 DIAGNOSIS — I152 Hypertension secondary to endocrine disorders: Secondary | ICD-10-CM | POA: Diagnosis not present

## 2017-03-23 DIAGNOSIS — E11319 Type 2 diabetes mellitus with unspecified diabetic retinopathy without macular edema: Secondary | ICD-10-CM | POA: Diagnosis not present

## 2017-03-23 DIAGNOSIS — H409 Unspecified glaucoma: Secondary | ICD-10-CM | POA: Diagnosis not present

## 2017-03-23 DIAGNOSIS — Z4781 Encounter for orthopedic aftercare following surgical amputation: Secondary | ICD-10-CM | POA: Diagnosis not present

## 2017-03-23 DIAGNOSIS — E1151 Type 2 diabetes mellitus with diabetic peripheral angiopathy without gangrene: Secondary | ICD-10-CM | POA: Diagnosis not present

## 2017-03-23 NOTE — Telephone Encounter (Signed)
Return call to [place], [facility] - not available; left message to give [place] a call back.

## 2017-03-23 NOTE — Telephone Encounter (Signed)
[place] from [facility] requesting VO. Please call back.

## 2017-03-25 ENCOUNTER — Ambulatory Visit (INDEPENDENT_AMBULATORY_CARE_PROVIDER_SITE_OTHER): Payer: Medicare Other | Admitting: Podiatry

## 2017-03-25 ENCOUNTER — Other Ambulatory Visit: Payer: Self-pay | Admitting: Internal Medicine

## 2017-03-25 DIAGNOSIS — E1121 Type 2 diabetes mellitus with diabetic nephropathy: Secondary | ICD-10-CM | POA: Diagnosis not present

## 2017-03-25 DIAGNOSIS — I70235 Atherosclerosis of native arteries of right leg with ulceration of other part of foot: Secondary | ICD-10-CM

## 2017-03-25 DIAGNOSIS — E1159 Type 2 diabetes mellitus with other circulatory complications: Secondary | ICD-10-CM | POA: Diagnosis not present

## 2017-03-25 DIAGNOSIS — G894 Chronic pain syndrome: Secondary | ICD-10-CM | POA: Diagnosis not present

## 2017-03-25 DIAGNOSIS — J441 Chronic obstructive pulmonary disease with (acute) exacerbation: Secondary | ICD-10-CM | POA: Diagnosis not present

## 2017-03-25 DIAGNOSIS — E1151 Type 2 diabetes mellitus with diabetic peripheral angiopathy without gangrene: Secondary | ICD-10-CM | POA: Diagnosis not present

## 2017-03-25 DIAGNOSIS — H409 Unspecified glaucoma: Secondary | ICD-10-CM | POA: Diagnosis not present

## 2017-03-25 DIAGNOSIS — L97512 Non-pressure chronic ulcer of other part of right foot with fat layer exposed: Secondary | ICD-10-CM

## 2017-03-25 DIAGNOSIS — I5032 Chronic diastolic (congestive) heart failure: Secondary | ICD-10-CM | POA: Diagnosis not present

## 2017-03-25 DIAGNOSIS — E11319 Type 2 diabetes mellitus with unspecified diabetic retinopathy without macular edema: Secondary | ICD-10-CM | POA: Diagnosis not present

## 2017-03-25 DIAGNOSIS — E1142 Type 2 diabetes mellitus with diabetic polyneuropathy: Secondary | ICD-10-CM | POA: Diagnosis not present

## 2017-03-25 DIAGNOSIS — Z9889 Other specified postprocedural states: Secondary | ICD-10-CM

## 2017-03-25 DIAGNOSIS — E785 Hyperlipidemia, unspecified: Secondary | ICD-10-CM | POA: Diagnosis not present

## 2017-03-25 DIAGNOSIS — E1139 Type 2 diabetes mellitus with other diabetic ophthalmic complication: Secondary | ICD-10-CM | POA: Diagnosis not present

## 2017-03-25 DIAGNOSIS — I152 Hypertension secondary to endocrine disorders: Secondary | ICD-10-CM | POA: Diagnosis not present

## 2017-03-25 DIAGNOSIS — Z4781 Encounter for orthopedic aftercare following surgical amputation: Secondary | ICD-10-CM | POA: Diagnosis not present

## 2017-03-25 NOTE — Telephone Encounter (Signed)
Had been stopped. On ACE. Pls call pharmacy and make certain not getting ARB. Thanks

## 2017-03-25 NOTE — Telephone Encounter (Signed)
[facility] pharmacy called.informed.

## 2017-03-26 ENCOUNTER — Observation Stay (HOSPITAL_COMMUNITY)
Admission: AD | Admit: 2017-03-26 | Discharge: 2017-03-27 | Disposition: A | Discharge status: Home / Self Care | Payer: Medicare Other | Source: Ambulatory Visit | Attending: Oncology | Admitting: Oncology

## 2017-03-26 ENCOUNTER — Ambulatory Visit (INDEPENDENT_AMBULATORY_CARE_PROVIDER_SITE_OTHER): Payer: Medicare Other | Admitting: Internal Medicine

## 2017-03-26 ENCOUNTER — Encounter (HOSPITAL_COMMUNITY): Payer: Self-pay | Admitting: General Practice

## 2017-03-26 ENCOUNTER — Encounter: Payer: Self-pay | Admitting: Internal Medicine

## 2017-03-26 VITALS — BP 100/64 | HR 50 | Temp 97.5°F | Wt 275.1 lb

## 2017-03-26 DIAGNOSIS — E669 Obesity, unspecified: Secondary | ICD-10-CM | POA: Diagnosis present

## 2017-03-26 DIAGNOSIS — Z794 Long term (current) use of insulin: Secondary | ICD-10-CM | POA: Diagnosis not present

## 2017-03-26 DIAGNOSIS — Z833 Family history of diabetes mellitus: Secondary | ICD-10-CM | POA: Insufficient documentation

## 2017-03-26 DIAGNOSIS — E1142 Type 2 diabetes mellitus with diabetic polyneuropathy: Secondary | ICD-10-CM | POA: Diagnosis not present

## 2017-03-26 DIAGNOSIS — E1139 Type 2 diabetes mellitus with other diabetic ophthalmic complication: Secondary | ICD-10-CM | POA: Diagnosis not present

## 2017-03-26 DIAGNOSIS — J441 Chronic obstructive pulmonary disease with (acute) exacerbation: Secondary | ICD-10-CM

## 2017-03-26 DIAGNOSIS — Z9119 Patient's noncompliance with other medical treatment and regimen: Secondary | ICD-10-CM

## 2017-03-26 DIAGNOSIS — I700 Atherosclerosis of aorta: Secondary | ICD-10-CM | POA: Diagnosis not present

## 2017-03-26 DIAGNOSIS — Z885 Allergy status to narcotic agent status: Secondary | ICD-10-CM | POA: Diagnosis not present

## 2017-03-26 DIAGNOSIS — Z6841 Body Mass Index (BMI) 40.0 and over, adult: Secondary | ICD-10-CM | POA: Diagnosis not present

## 2017-03-26 DIAGNOSIS — E1169 Type 2 diabetes mellitus with other specified complication: Secondary | ICD-10-CM | POA: Diagnosis present

## 2017-03-26 DIAGNOSIS — Z86718 Personal history of other venous thrombosis and embolism: Secondary | ICD-10-CM | POA: Insufficient documentation

## 2017-03-26 DIAGNOSIS — F332 Major depressive disorder, recurrent severe without psychotic features: Secondary | ICD-10-CM | POA: Diagnosis not present

## 2017-03-26 DIAGNOSIS — E1159 Type 2 diabetes mellitus with other circulatory complications: Secondary | ICD-10-CM

## 2017-03-26 DIAGNOSIS — E1165 Type 2 diabetes mellitus with hyperglycemia: Principal | ICD-10-CM

## 2017-03-26 DIAGNOSIS — Z8249 Family history of ischemic heart disease and other diseases of the circulatory system: Secondary | ICD-10-CM | POA: Insufficient documentation

## 2017-03-26 DIAGNOSIS — T50905A Adverse effect of unspecified drugs, medicaments and biological substances, initial encounter: Secondary | ICD-10-CM | POA: Diagnosis not present

## 2017-03-26 DIAGNOSIS — I110 Hypertensive heart disease with heart failure: Secondary | ICD-10-CM | POA: Insufficient documentation

## 2017-03-26 DIAGNOSIS — Z720 Tobacco use: Secondary | ICD-10-CM | POA: Diagnosis present

## 2017-03-26 DIAGNOSIS — E1151 Type 2 diabetes mellitus with diabetic peripheral angiopathy without gangrene: Secondary | ICD-10-CM

## 2017-03-26 DIAGNOSIS — G8929 Other chronic pain: Secondary | ICD-10-CM

## 2017-03-26 DIAGNOSIS — Z89612 Acquired absence of left leg above knee: Secondary | ICD-10-CM

## 2017-03-26 DIAGNOSIS — E1121 Type 2 diabetes mellitus with diabetic nephropathy: Secondary | ICD-10-CM | POA: Diagnosis not present

## 2017-03-26 DIAGNOSIS — R400 Somnolence: Secondary | ICD-10-CM

## 2017-03-26 DIAGNOSIS — F17200 Nicotine dependence, unspecified, uncomplicated: Secondary | ICD-10-CM | POA: Diagnosis not present

## 2017-03-26 DIAGNOSIS — Z89512 Acquired absence of left leg below knee: Secondary | ICD-10-CM

## 2017-03-26 DIAGNOSIS — G4719 Other hypersomnia: Secondary | ICD-10-CM | POA: Diagnosis not present

## 2017-03-26 DIAGNOSIS — Z89519 Acquired absence of unspecified leg below knee: Secondary | ICD-10-CM

## 2017-03-26 DIAGNOSIS — J9621 Acute and chronic respiratory failure with hypoxia: Secondary | ICD-10-CM | POA: Diagnosis not present

## 2017-03-26 DIAGNOSIS — F329 Major depressive disorder, single episode, unspecified: Secondary | ICD-10-CM | POA: Insufficient documentation

## 2017-03-26 DIAGNOSIS — M797 Fibromyalgia: Secondary | ICD-10-CM | POA: Insufficient documentation

## 2017-03-26 DIAGNOSIS — H409 Unspecified glaucoma: Secondary | ICD-10-CM | POA: Insufficient documentation

## 2017-03-26 DIAGNOSIS — M069 Rheumatoid arthritis, unspecified: Secondary | ICD-10-CM | POA: Insufficient documentation

## 2017-03-26 DIAGNOSIS — G894 Chronic pain syndrome: Secondary | ICD-10-CM | POA: Insufficient documentation

## 2017-03-26 DIAGNOSIS — Z91041 Radiographic dye allergy status: Secondary | ICD-10-CM | POA: Diagnosis not present

## 2017-03-26 DIAGNOSIS — Z8619 Personal history of other infectious and parasitic diseases: Secondary | ICD-10-CM | POA: Insufficient documentation

## 2017-03-26 DIAGNOSIS — I152 Hypertension secondary to endocrine disorders: Secondary | ICD-10-CM

## 2017-03-26 DIAGNOSIS — Z79891 Long term (current) use of opiate analgesic: Secondary | ICD-10-CM

## 2017-03-26 DIAGNOSIS — E11621 Type 2 diabetes mellitus with foot ulcer: Secondary | ICD-10-CM | POA: Diagnosis not present

## 2017-03-26 DIAGNOSIS — IMO0002 Reserved for concepts with insufficient information to code with codable children: Secondary | ICD-10-CM

## 2017-03-26 DIAGNOSIS — Z79899 Other long term (current) drug therapy: Secondary | ICD-10-CM | POA: Insufficient documentation

## 2017-03-26 DIAGNOSIS — J449 Chronic obstructive pulmonary disease, unspecified: Secondary | ICD-10-CM | POA: Insufficient documentation

## 2017-03-26 DIAGNOSIS — E119 Type 2 diabetes mellitus without complications: Secondary | ICD-10-CM | POA: Diagnosis present

## 2017-03-26 DIAGNOSIS — Z89511 Acquired absence of right leg below knee: Secondary | ICD-10-CM

## 2017-03-26 DIAGNOSIS — Z888 Allergy status to other drugs, medicaments and biological substances status: Secondary | ICD-10-CM | POA: Diagnosis not present

## 2017-03-26 DIAGNOSIS — G4700 Insomnia, unspecified: Secondary | ICD-10-CM | POA: Diagnosis not present

## 2017-03-26 DIAGNOSIS — E785 Hyperlipidemia, unspecified: Secondary | ICD-10-CM | POA: Diagnosis not present

## 2017-03-26 DIAGNOSIS — K219 Gastro-esophageal reflux disease without esophagitis: Secondary | ICD-10-CM | POA: Insufficient documentation

## 2017-03-26 DIAGNOSIS — I5032 Chronic diastolic (congestive) heart failure: Secondary | ICD-10-CM | POA: Diagnosis present

## 2017-03-26 DIAGNOSIS — I10 Essential (primary) hypertension: Secondary | ICD-10-CM | POA: Diagnosis present

## 2017-03-26 DIAGNOSIS — E118 Type 2 diabetes mellitus with unspecified complications: Secondary | ICD-10-CM | POA: Diagnosis present

## 2017-03-26 DIAGNOSIS — M86671 Other chronic osteomyelitis, right ankle and foot: Secondary | ICD-10-CM | POA: Insufficient documentation

## 2017-03-26 DIAGNOSIS — Z86010 Personal history of colonic polyps: Secondary | ICD-10-CM | POA: Insufficient documentation

## 2017-03-26 DIAGNOSIS — Z89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): Secondary | ICD-10-CM

## 2017-03-26 DIAGNOSIS — F17210 Nicotine dependence, cigarettes, uncomplicated: Secondary | ICD-10-CM

## 2017-03-26 DIAGNOSIS — K760 Fatty (change of) liver, not elsewhere classified: Secondary | ICD-10-CM | POA: Insufficient documentation

## 2017-03-26 DIAGNOSIS — F324 Major depressive disorder, single episode, in partial remission: Secondary | ICD-10-CM | POA: Diagnosis present

## 2017-03-26 DIAGNOSIS — F1120 Opioid dependence, uncomplicated: Secondary | ICD-10-CM

## 2017-03-26 DIAGNOSIS — I209 Angina pectoris, unspecified: Secondary | ICD-10-CM | POA: Insufficient documentation

## 2017-03-26 LAB — CBC
HCT: 43.5 % (ref 36.0–46.0)
Hemoglobin: 13.8 g/dL (ref 12.0–15.0)
MCH: 31.2 pg (ref 26.0–34.0)
MCHC: 31.7 g/dL (ref 30.0–36.0)
MCV: 98.4 fL (ref 78.0–100.0)
Platelets: 150 10*3/uL (ref 150–400)
RBC: 4.42 MIL/uL (ref 3.87–5.11)
RDW: 14.4 % (ref 11.5–15.5)
WBC: 9.1 10*3/uL (ref 4.0–10.5)

## 2017-03-26 LAB — BASIC METABOLIC PANEL
Anion gap: 8 (ref 5–15)
BUN: 14 mg/dL (ref 6–20)
CO2: 28 mmol/L (ref 22–32)
Calcium: 8.8 mg/dL — ABNORMAL LOW (ref 8.9–10.3)
Chloride: 105 mmol/L (ref 101–111)
Creatinine, Ser: 1.05 mg/dL — ABNORMAL HIGH (ref 0.44–1.00)
GFR calc Af Amer: >60 mL/min (ref >60)
GFR calc non Af Amer: 57 mL/min — ABNORMAL LOW (ref >60)
Glucose, Bld: 96 mg/dL (ref 65–99)
Potassium: 3.7 mmol/L (ref 3.5–5.1)
Sodium: 141 mmol/L (ref 135–145)

## 2017-03-26 LAB — POCT GLYCOSYLATED HEMOGLOBIN (HGB A1C): Hemoglobin A1C: 6.9

## 2017-03-26 LAB — GLUCOSE, CAPILLARY
Glucose-Capillary: 115 mg/dL — ABNORMAL HIGH (ref 65–99)
Glucose-Capillary: 126 mg/dL — ABNORMAL HIGH (ref 65–99)
Glucose-Capillary: 163 mg/dL — ABNORMAL HIGH (ref 65–99)
Glucose-Capillary: 246 mg/dL — ABNORMAL HIGH (ref 65–99)
Glucose-Capillary: 245 mg/dL — ABNORMAL HIGH (ref 65–99)

## 2017-03-26 MED ORDER — INSULIN ASPART 100 UNIT/ML ~~LOC~~ SOLN
0.0000 [IU] | Freq: Every day | SUBCUTANEOUS | Status: DC
Start: 2017-03-26 — End: 2017-03-27
  Administered 2017-03-26: 2 [IU] via SUBCUTANEOUS

## 2017-03-26 MED ORDER — INSULIN ASPART PROT & ASPART (70-30 MIX) 100 UNIT/ML ~~LOC~~ SUSP
70.0000 [IU] | Freq: Two times a day (BID) | SUBCUTANEOUS | Status: DC
  Administered 2017-03-27 (×2): 70 [IU] via SUBCUTANEOUS
  Filled 2017-03-26: qty 10

## 2017-03-26 MED ORDER — ALBUTEROL SULFATE (2.5 MG/3ML) 0.083% IN NEBU: 2.5000 mg | INHALATION_SOLUTION | Freq: Four times a day (QID) | RESPIRATORY_TRACT | Status: DC | PRN

## 2017-03-26 MED ORDER — NICOTINE 21 MG/24HR TD PT24
21.0000 mg | MEDICATED_PATCH | Freq: Every day | TRANSDERMAL | Status: DC
  Administered 2017-03-26 – 2017-03-27 (×2): 21 mg via TRANSDERMAL
  Filled 2017-03-26 (×2): qty 1

## 2017-03-26 MED ORDER — ACETAMINOPHEN 325 MG PO TABS
650.0000 mg | ORAL_TABLET | Freq: Four times a day (QID) | ORAL | Status: DC | PRN
Start: 2017-03-26 — End: 2017-03-27

## 2017-03-26 MED ORDER — BENAZEPRIL HCL 40 MG PO TABS
40.0000 mg | ORAL_TABLET | Freq: Every day | ORAL | Status: DC
  Administered 2017-03-27: 40 mg via ORAL
  Filled 2017-03-26: qty 1

## 2017-03-26 MED ORDER — HYDROCHLOROTHIAZIDE 25 MG PO TABS
25.0000 mg | ORAL_TABLET | Freq: Every day | ORAL | Status: DC
  Administered 2017-03-27: 25 mg via ORAL
  Filled 2017-03-26: qty 1

## 2017-03-26 MED ORDER — ATENOLOL 50 MG PO TABS
100.0000 mg | ORAL_TABLET | Freq: Every day | ORAL | Status: DC
  Administered 2017-03-27: 100 mg via ORAL
  Filled 2017-03-26: qty 2

## 2017-03-26 MED ORDER — OXYCODONE-ACETAMINOPHEN 5-325 MG PO TABS
1.0000 | ORAL_TABLET | Freq: Three times a day (TID) | ORAL | Status: DC | PRN
  Administered 2017-03-27: 1 via ORAL
  Filled 2017-03-26: qty 1

## 2017-03-26 MED ORDER — ROSUVASTATIN CALCIUM 20 MG PO TABS
20.0000 mg | ORAL_TABLET | Freq: Every day | ORAL | Status: DC
Start: 2017-03-26 — End: 2017-03-27
  Administered 2017-03-26: 20 mg via ORAL
  Filled 2017-03-26: qty 1

## 2017-03-26 MED ORDER — IBUPROFEN 400 MG PO TABS: 400.0000 mg | ORAL_TABLET | Freq: Four times a day (QID) | ORAL | Status: DC | PRN

## 2017-03-26 MED ORDER — ACETAMINOPHEN 650 MG RE SUPP
650.0000 mg | Freq: Four times a day (QID) | RECTAL | Status: DC | PRN
Start: 2017-03-26 — End: 2017-03-27

## 2017-03-26 MED ORDER — SERTRALINE HCL 100 MG PO TABS
100.0000 mg | ORAL_TABLET | Freq: Every day | ORAL | Status: DC
  Administered 2017-03-27: 100 mg via ORAL
  Filled 2017-03-26: qty 1

## 2017-03-26 MED ORDER — INSULIN ASPART 100 UNIT/ML ~~LOC~~ SOLN
0.0000 [IU] | Freq: Three times a day (TID) | SUBCUTANEOUS | Status: DC
Start: 2017-03-26 — End: 2017-03-27
  Administered 2017-03-26 – 2017-03-27 (×2): 5 [IU] via SUBCUTANEOUS
  Administered 2017-03-27: 2 [IU] via SUBCUTANEOUS
  Administered 2017-03-27: 15 [IU] via SUBCUTANEOUS

## 2017-03-26 MED ORDER — ENOXAPARIN SODIUM 40 MG/0.4ML ~~LOC~~ SOLN
40.0000 mg | SUBCUTANEOUS | Status: DC
  Administered 2017-03-26: 40 mg via SUBCUTANEOUS
  Filled 2017-03-26: qty 0.4

## 2017-03-26 NOTE — Progress Notes (Signed)
   Subjective:    [name], female    DOB: 1961/01/07, 56 y.o.   MRN: [id number]  HPI  Jennifer [name] is here for DM F/U. Please see the A&P for the status of the pt's chronic medical problems.  ROS : per ROS section and in problem oriented charting. All other systems are negative.  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  Review of Systems  Constitutional: Positive for fatigue. Negative for activity change, appetite change and fever.  HENT:       Mouth dry  Gastrointestinal: Negative for diarrhea.  Genitourinary: Negative for difficulty urinating and dysuria.  Skin:       Thick toenails R foot  Neurological: Positive for tremors.  Psychiatric/Behavioral: Positive for sleep disturbance.       Objective:   Physical Exam  Constitutional: She appears well-developed and well-nourished. No distress.  Falls asleep while talking and getting BP rechecked. Obese  HENT:  Head: Normocephalic and atraumatic.  Right Ear: External ear normal.  Left Ear: External ear normal.  Nose: Nose normal.  Eyes: Conjunctivae and EOM are normal. Right eye exhibits no discharge. Left eye exhibits no discharge. No scleral icterus.  Cardiovascular: Normal rate, regular rhythm and normal heart sounds.   No murmur heard. Pulmonary/Chest: Effort normal and breath sounds normal. No respiratory distress. She has no wheezes.  Decreased breath sounds on the right Audible wheezing without stethoscope but no wheezing with stethoscope  Neurological:  Alert but repeatedly falls asleep while in conversation with me and while nurse Ulis Rias was checking vitals  Skin: Skin is warm and dry. She is not diaphoretic.  Changes consistent with chronic venous insufficiency, right lower extremity          Assessment & Plan:

## 2017-03-26 NOTE — Progress Notes (Signed)
Jennifer [name] is a 56 y.o. female patient admitted from ED awake, alert - oriented  X 4 - no acute distress noted.  VSS - Blood pressure 123/84, pulse (!) 59, temperature 97.3 F (36.3 C), temperature source Oral, resp. rate 18, SpO2 92 %.    IV in place, occlusive dsg intact without redness.  Orientation to room, and floor completed with information packet given to patient/family.  Patient declined safety video at this time.  Admission INP armband ID verified with patient/family, and in place.   SR up x 2, fall assessment complete, with patient verbalized understanding to call nsg before up out of bed.  Call light within reach, patient able to voice, and demonstrate understanding.  Skin, clean-dry- intact without evidence of bruising, or skin tears.   No evidence of skin break down noted on exam.     Will cont to eval and treat per MD orders.  Celine Ahr, RN 03/26/2017 4:38 PM

## 2017-03-26 NOTE — Assessment & Plan Note (Signed)
This problem is chronic and stable. Her BMI is 43. Today she told me she is a potato chips, nabs, and a soda. Other foods that she ate in the past day or 2 including spaghetti, [employer], and chilli. She is in the precontemplation stage. She frequently says she wants to get things controlled but she is not able to do anything to act on that.  PLAN:  Support  Wt Readings from Last 3 Encounters:  03/26/17 275 lb 1.6 oz (124.8 kg)  01/26/17 275 lb 1.6 oz (124.8 kg)  01/13/17 272 lb 14.4 oz (123.8 kg)

## 2017-03-26 NOTE — Assessment & Plan Note (Addendum)
This problem is chronic and unstable. Today is the first time I have ever seen her so somnolent that she cannot stay awake during our interaction. She is on oxycodone which is on a slow taper. She also takes Lyrica, baclofen, and Zoloft. None of these are new medications nor have the doses changed other than a reduction in the quantity of oxycodone. She states she last took all of these medications this morning. My original intention for the opioid taper was to get her back to the dose she was on prior to her amputation which was 240 pills. Because I did not notice any decline in her functional status with the taper, I continued the taper and she is currently on 234 pills per month. Today's somnolence strengthens the need for an opioid taper. I will even entertain escalating the taper as I am only decreasing her 4 pills per month. The other factor supporting the need for a taper is her inability to follow through with my recommendations including seeing a pain therapist for a second opinion as to the etiology, seeing a psychiatrist as her depression is playing a big part in her pain, and getting her diabetes under control to prevent further amputations.  PLAN : Continue oxycodone taper.  UDS today She has prescriptions at home to last until mid June. Would consider escalating taper up to 10% at that time

## 2017-03-26 NOTE — Assessment & Plan Note (Signed)
This problem is chronic and uncontrolled. She is on Zoloft but her depression is not controlled on this medication. I have tried several other medications all without resolution of her depression. I have discussed sending her to psychiatry but she does not want to pay the co-pay.  Plan: Continue Zoloft Continue support

## 2017-03-26 NOTE — Assessment & Plan Note (Signed)
This problem is new. She has chronic respiratory failure due to COPD from ongoing smoking. Today, she would become somnolent while actively engaged in conversation with me and became hypoxic down to O2 sat 88%. When she become alert, her O2 sat would rebounded to about 94%. The cause of this is likely sedation from overmedication on top of her underlying lung disease. I can find no other cause like infection, COPD exacerbation, or a coronary issue. In addition, her blood pressure is lower today than it typically is. Hypoglycemia was ruled out with 2 fingersticks. Her home situation is not stable and supportive and she states her sons would not be able to watch her if she were to go home. I am concerned that if she were to go home, she would continue to take sedating medications like oxycodone and her somnolence would increase and then could lead to complications like renal failure and acidosis. Therefore I discussed with her admission to the hospital to allow the somnolence to resolve. I do not think Narcan is in her best interest as her pain is chronic and a pharmaceutical withdrawal is more likely to have complications in a natural withdrawal.  Plan : Admit to observation

## 2017-03-26 NOTE — Assessment & Plan Note (Signed)
This problem is new and worse. Her regimen includes Norvasc 10, atenolol 100, benazepril 40, Lasix 40, and HCTZ 25. She has always been hypotensive despite this regimen but today she is hypotensive and is unable to tell me why. I suspect that this is due to overmedication from her opioids and she is being admitted.  PLAN : admission to obs  BP Readings from Last 3 Encounters:  03/26/17 (!) 100/31  01/28/17 (!) 159/67  01/27/17 (!) 150/70

## 2017-03-26 NOTE — H&P (Signed)
Date: 03/26/2017               Patient Name:  Jennifer [name] MRN: [id number]  DOB: 1961/02/16 Age / Sex: 56 y.o.,  female   PCP: Bartholomew Crews, MD         Medical Service: Internal Medicine Teaching Service         Attending Physician: Dr. Annia Belt, MD    First Contact: Dr. Heber [place] Pager: [phone]  Second Contact: Dr. Posey [place] Pager: [phone]       After Hours (After 5p/  First Contact Pager: [phone]  weekends / holidays): Second Contact Pager: [phone]   Chief Complaint: "so sleepy"  History of Present Illness: Ms. Rhinehart Is a 56 yo F with past medical history of chronic diastolic heart failure, type 2 diabetes, hypertension, history of infective endocarditis, COPD, GERD, hyperlipidemia, chronic opioid use, depression, left BKA, and tobacco abuse who presented to the internal medicine clinic this morning for follow-up of type 2 diabetes.  Patient was noted to be extremely somnolent in the clinic, falling asleep when her blood pressure was taken and then continuously and repetitively falling asleep during conversations and on examination. When asleep, patient was noted to be hypoxic at 88%. On my discussion with the patient, she states that these lethargic episodes occur about 3 times per week. She states that she will feel extremely fatigued and will start falling asleep when she is talking with someone. She states that she has even fallen off of her bed. Patient states that these episodes have been occurring since 2014 since her left lower extremity BKA. In the past, these episodes have scared her because she didn't know who her children were or where she was. Patient states that she doesn't sleep well at night, she states it will typically take her many hours to fall asleep. She wonders if her insomnia is due to anxiety. Last night, patient states that she didn't fall asleep until 2:30 in the morning. She awoke around 4:30 AM and was unable to fall back asleep. She then  proceeded to take 2 tablets of oxycodone-acetaminophen 5 mg, 2 tablets of baclofen 10 mg, 2 tablets of Lyrica 200 mg and Zoloft. She denies taking trazodone last night. She states that she no longer takes trazodone. Patient lives at home and her 2 children live with her. However, they do not help take care of her. He states that she performs all of her ADLs on her own and manages her own medications. She denies any trouble knowing how to take her medications correctly.  Patient takes chronic oxycodone for her diffuse fibromyalgia pain. She will take anywhere from 6-8 tablets per day. Patient denies any other associated symptoms other than her fatigue. She denies fever, chills, chest pain, nausea, vomiting, shortness of breath, diaphoresis, weight gain, weight loss, dysuria, urinary frequency, confusion, lightheadedness, weakness, numbness.   Meds: No current facility-administered medications for this encounter.    Current Outpatient Prescriptions  Medication Sig Dispense Refill  . ADVAIR DISKUS 250-50 MCG/DOSE AEPB TAKE 1 INHALATION BY MOUTH TWICE DAILY (Patient not taking: Reported on 01/07/2017) 180 each 3  . albuterol (PROVENTIL) (2.5 MG/3ML) 0.083% nebulizer solution INHALE THE CONTENTS OF 1 VIAL VIA NEBULIZER EVERY 6 HOURS AS NEEDED FOR WHEEZING (Patient not taking: Reported on 01/07/2017) 360 mL 2  . amLODipine (NORVASC) 10 MG tablet TAKE 1 TABLET BY MOUTH EVERY DAY (Patient not taking: Reported on 01/07/2017) 90 tablet 3  . atenolol (TENORMIN) 100  MG tablet Take 1 tablet (100 mg total) by mouth daily. (Patient not taking: Reported on 01/07/2017) 90 tablet 3  . baclofen (LIORESAL) 10 MG tablet Take 1 tablet (10 mg total) by mouth 3 (three) times daily as needed for muscle spasms. Reported on 06/27/2016 (Patient not taking: Reported on 01/07/2017) 90 each 4  . benazepril (LOTENSIN) 40 MG tablet TAKE 1 TABLET BY MOUTH ONCE DAILY 90 tablet 3  . EASY TOUCH PEN NEEDLES 31G X 8 MM MISC USE TO INJECT INSULIN  TWICE DAILY 100 each 5  . furosemide (LASIX) 40 MG tablet Take 1 tablet (40 mg total) by mouth daily as needed. (Patient not taking: Reported on 01/07/2017) 90 tablet 2  . hydrochlorothiazide (HYDRODIURIL) 25 MG tablet Take 1 tablet (25 mg total) by mouth daily. (Patient not taking: Reported on 01/07/2017) 90 tablet 3  . Insulin Lispro Prot & Lispro (HUMALOG MIX 75/25 KWIKPEN) (75-25) 100 UNIT/ML Kwikpen Inject 140 units subcutaneously before breakfast and 85 units before supper (Patient not taking: Reported on 01/07/2017) 75 mL 4  . ipratropium (ATROVENT) 0.02 % nebulizer solution USE 1 VIAL VIA NEBULIZER EVERY 6 HOURS AS NEEDED FOR WHEEZING (Patient not taking: Reported on 01/07/2017) 75 mL 5  . LYRICA 200 MG capsule TAKE 1 CAPSULE BY MOUTH THREE TIMES A DAY (Patient not taking: Reported on 01/07/2017) 270 capsule 3  . metFORMIN (GLUCOPHAGE XR) 500 MG 24 hr tablet Take 1 tablet (500 mg total) by mouth daily with breakfast. (Patient not taking: Reported on 01/07/2017) 30 tablet 11  . nystatin (MYCOSTATIN) powder APPLY TO AFFECTED AREA THREE TIMES A DAY (Patient not taking: Reported on 01/07/2017) 60 g 4  . ONETOUCH DELICA LANCETS 56E MISC USE TO CHECK BLOOD SUGARS FOUR TIMES A DAY 100 each 3  . ONETOUCH VERIO test strip USE 3-4 TIMES DAILY TO CHECK BLOOD SUGAR 100 each 5  . oxyCODONE-acetaminophen (PERCOCET/ROXICET) 5-325 MG tablet Take 1-2 tablets by mouth every 4 (four) hours as needed for severe pain. 234 tablet 0  . pantoprazole (PROTONIX) 40 MG tablet TAKE 1 TABLET (40 MG TOTAL) BY MOUTH DAILY. (Patient not taking: Reported on 01/07/2017) 30 tablet 11  . potassium chloride SA (K-DUR,KLOR-CON) 20 MEQ tablet TAKE 2 TABLETS BY MOUTH DAILY (Patient not taking: Reported on 01/07/2017) 180 tablet 0  . PROAIR HFA 108 (90 Base) MCG/ACT inhaler INHALE 2 PUFFS BY MOUTH EVERY 6 HOURS AS NEEDED FOR WHEEZING (Patient not taking: Reported on 01/07/2017) 8.5 each 4  . ranitidine (ZANTAC) 150 MG tablet Take 1 tablet (150  mg total) by mouth 2 (two) times daily. (Patient not taking: Reported on 01/07/2017) 180 tablet 3  . rosuvastatin (CRESTOR) 20 MG tablet Take 20 mg by mouth at bedtime.    . sertraline (ZOLOFT) 100 MG tablet Take 1 tablet (100 mg total) by mouth daily. (Patient not taking: Reported on 01/07/2017) 90 tablet 3    Allergies: Allergies as of 03/26/2017 - Review Complete 03/26/2017  Allergen Reaction Noted  . Abilify [aripiprazole] Other (See Comments) 11/09/2015  . Iohexol  08/17/2007  . Ivp dye [iodinated diagnostic agents]  05/10/2013  . Morphine sulfate Itching and Rash    Past Medical History:  Diagnosis Date  . Anginal pain [facility])    '3' of 10 ischemia ruled out 9/9   . 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 Asthma   . CHF (congestive heart failure) ([place])   . 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 Chronic  bronchitis ([place])    "I get it alot" (09/28/2013)  . Chronic diastolic heart failure (HCC)    grade 2 per 2D echocardiogram (01/2013)  . Chronic lower back pain   . Chronic osteomyelitis of foot (HCC)    chronic, right secondary to diabetic foot ulcers  . Chronic pain syndrome 12/03/2011   Likely secondary to depression, "fibromyalgia", neuropathy, and obesity. Lumbar MRI 2014 no sig change from prior (2008) : Stable hypertrophic facet disease most notable at L4-5. Stable shallow left foraminal/extraforaminal disc protrusion at L4-5. No direct neural compression.      Marland Kitchen COPD 01/08/2007   PFT's 05/2007 : FEV1/FVC 82, FEV1 64% pred, FEF 25-75% 40% predicted, 16% improvement in FEV1 with bronchodilators.     . Depression   . Diabetic peripheral neuropathy ([place])   . DVT of upper extremity (deep vein thrombosis) ([place]) 03/11/2013   Secondary to PICC line. Right brachial vein, diagnosed on 03/10/2013 Coumadin for 3 months. End date 06/10/2013   . Environmental allergies    Hx: of  . Exertional shortness of breath   . Fatty liver 2003   observed on ultrasound abdomen  .  Fibromyalgia   . GERD (gastroesophageal reflux disease)   . Glaucoma   . Hyperlipidemia   . Hyperplastic colon polyp 12/2010   Per colonoscopy (12/2010) - Dr. Deatra Ina  . Hypertension   . 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 Blood cultures 01/26/13 S. Aureus and GBS. Blood cultures Feb 6th, 8th, and 9th and March were negative.Repeat TEE 3/20 negative for vegitations  . 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 2/2 tobacco use, DM, and cont weight bearing on surgical wound and developed gangrene   . Polymicrobial bacterial infection 01/2013   GBS and S. aureus bacteremia // Source likely infected diabetic foot ulcer  . PVD (peripheral vascular disease) with claudication ([place])    Stents to bilateral common iliac arteries (left 2005, right 2008), on chronic plavix  . Rheumatoid arthritis ([place])   . Tobacco abuse   . Type II diabetes mellitus with peripheral circulatory disorders, uncontrolled DX: 1993   Insulin dep. Poor control. Complicated by diabetic foot ulcer and diabetic eye disease.    Marland Kitchen Ulcer of foot, chronic (HCC)    Left. No OM per MRI (01/2013)    Family History:  Mother: HTN, T2DM  Social History:  Tobacco Use: 1.5 ppd x 40 years Alcohol Use: Denies Illicit Drug Use: Denies  Review of Systems: A complete ROS was reviewed and negative except as per HPI.   Physical Exam: T 97.5, BP 100/31, HR 52, Pulse Ox 94% on room air General: Vital signs reviewed.  Patient is somnolent, obese, in no acute distress and cooperative with exam.  Eyes: PERRL, conjunctivae normal, no scleral icterus. Disconjugate gaze. Ears, Nose, Throat, and Mouth: Normal bilateral nasal turbinates. Normal posterior oropharynx. Moist mucous membranes.  Cardiovascular: RRR,  no murmurs, gallops, or rubs. Trace right lower extremity edema.  Pulmonary: Clear to auscultation bilaterally,  no wheezes, rales, or rhonchi. No accessory muscle use. Gastrointestinal: Soft, non-tender, non-distended, BS +  Musculoskeletal: S/p L BKA Neurologic: Awake, but repetitively falls asleep even when talking and on exam. Oriented x3, Strength is normal and symmetric bilaterally, cranial nerve II-XII are grossly intact, no focal motor deficit, sensory intact to light touch bilaterally.  Skin: Warm, dry and intact. No rashes or erythema. Psychiatric: Normal  mood and affect.   Assessment & Plan by Problem: Principal Problem:   Sedated due to multiple medications Active Problems:   DM (diabetes mellitus) type II uncontrolled, periph vascular disorder (HCC)   Hyperlipidemia associated with type 2 diabetes mellitus (HCC)   Depression, major, severe recurrence ([place])   Hypertension associated with diabetes ([place])   Chronic prescription opiate use   Severe obesity (BMI >= 40) (HCC)   Chronic diastolic heart failure (HCC)   S/P BKA (below knee amputation) unilateral (HCC)   Tobacco abuse   Atherosclerosis of aorta (HCC)   Type 2 diabetes with nephropathy ([place])  Ms. Stroope Is a 1 past medical history of chronic diastolic heart failure, type 2 diabetes, hypertension, history of infective endocarditis, COPD, GERD, hyperlipidemia, chronic opioid use, depression, left BKA, and tobacco abuse who presented to the internal medicine clinic this morning for follow-up of type 2 diabetes.  Oversedation secondary to multiple medications: Patient presents to the clinic appearing oversedated. She repeatedly falls asleep when talking and on examination. She reports poor sleep, only sleeping 2 hours last night. In addition, patient reports taking oxycodone 10 mg, baclofen 20 mg, Lyrica 400 mg and sertraline this morning. Patient's oversedation is likely secondary to multiple medication side effects. This is likely exacerbated by poor sleep. I doubt intentional overdose. However, patient does appear to be slightly  confused on how much of her medications to take and when. I doubt any underlying neurologic or infectious etiology to her sedation. Her insomnia is likely playing a role, but not the primary etiology. Other considerations would also be hypercarbia given her obesity and underlying COPD, but no prior history of hypercarbia. Bicarbonate 28 on admission. -Admit to observation -Hold baclofen and Lyrica -We will decrease oxycodone to 5 mg every 8 hours as needed for pain to prevent withdrawal, starting tonight -Tylenol when necessary and Advil when necessary for pain control -UDS  Chronic pain 2/2 fibromyalgia: Patient is on oxycodone 5-3 25 mg #246 tablets per 30 days. Controlled substance database urine drug screens have been as expected. Plans were to taper patient down on this medication, but given her recent amputation in January, patient had increasing pain. Patient states the majority of her pain is secondary to fibromyalgia. In order to decrease the possibility of future episodes of oversedation, would consider down on oxycodone. She is on Lyrica 200 mg 3 times a day that should help with her treatment for fibromyalgia pain. Duloxetine may also be a future option for birth control of depression and fibromyalgia. -Holding Lyrica -Decreased dose of oxycodone to 5 mg every 8 hours as needed for pain to prevent withdrawal, starting tonight -Tylenol and Advil when necessary pain  Type 2 diabetes: Hemoglobin A1c 6.9 today. Patient takes 75/25 insulin 140 units in the morning and 85 units at night. -Continue 75/25 insulin with decreased dose to 70 units twice a day -SSI-M ACHS  Hyperlipidemia: Patient reports compliance with rosuvastatin 20 mg daily. -Continue rosuvastatin 20 mg daily  Depression: Patient reports compliance with sertraline 100 mg daily. She states she is no longer taking trazodone. -Continue sertraline 100 mg daily  Hypertension: BP 100/31. Patient reports compliance with her  antihypertensive medications. -Continue hydrochlorothiazide 25 mg daily -Continue benazepril 40 mg daily -Continue atenolol 100 mg daily -Holding amlodipine 10 mg daily  Tobacco abuse: Patient reports a 60-pack-year history. -Counseled on smoking cessation -Nicotine patch  Code: Full FEN: Carb modified DVT/PE ppx: Lovenox subcutaneous daily  Dispo: Admit patient to Observation with expected length  of stay less than 2 midnights.  Signed: Martyn Malay, DO PGY-3 Internal Medicine Resident Pager # [phone] 03/26/2017 2:56 PM

## 2017-03-26 NOTE — Assessment & Plan Note (Signed)
This problem is chronic and stable. She is on albuterol, Atrovent, Advair. She had no signs or symptoms of the COPD exacerbation today. She continues to smoke. Her oxygenation is normal today when she is alert but she becomes hypoxic when she falls asleep. This is due to overmedication. She is being admitted  PLAN:  Cont current meds Continue to stressed need for smoking cessation

## 2017-03-26 NOTE — Assessment & Plan Note (Signed)
This problem is chronic and uncontrolled. Her AC trend is 8.7 - 8.5 - 6.9 today. She takes 75/25 130 units in the morning and 85 units at night. She is also prescribed metformin 500 once a day. Her insulin doses never matche up what has been prescribed. At her last appointment, we discussed how her poorly controlled diabetes is contributing to her amputations and she decided that she would check her sugars 3-4 times a day and keep a food log. She has checked her sugars 11 times in the past month. Her sugars are ranging from 79 to "HIGH". I did talk with her that there is nothing I can do in regards to her diabetes control until she takes ownership of her diabetes and that she will continue to have complications until she is able to get controlled.  PLAN : Cont current meds Supportive care There is no need for intensive physician intervention until she takes ownership of her own medical care.

## 2017-03-27 DIAGNOSIS — G4719 Other hypersomnia: Secondary | ICD-10-CM | POA: Diagnosis not present

## 2017-03-27 DIAGNOSIS — Z888 Allergy status to other drugs, medicaments and biological substances status: Secondary | ICD-10-CM

## 2017-03-27 DIAGNOSIS — J449 Chronic obstructive pulmonary disease, unspecified: Secondary | ICD-10-CM | POA: Diagnosis not present

## 2017-03-27 DIAGNOSIS — E119 Type 2 diabetes mellitus without complications: Secondary | ICD-10-CM | POA: Diagnosis not present

## 2017-03-27 DIAGNOSIS — E785 Hyperlipidemia, unspecified: Secondary | ICD-10-CM

## 2017-03-27 DIAGNOSIS — F5104 Psychophysiologic insomnia: Secondary | ICD-10-CM | POA: Diagnosis not present

## 2017-03-27 DIAGNOSIS — I110 Hypertensive heart disease with heart failure: Secondary | ICD-10-CM

## 2017-03-27 DIAGNOSIS — G4710 Hypersomnia, unspecified: Secondary | ICD-10-CM | POA: Diagnosis not present

## 2017-03-27 DIAGNOSIS — F17210 Nicotine dependence, cigarettes, uncomplicated: Secondary | ICD-10-CM | POA: Diagnosis not present

## 2017-03-27 DIAGNOSIS — F329 Major depressive disorder, single episode, unspecified: Secondary | ICD-10-CM | POA: Diagnosis not present

## 2017-03-27 DIAGNOSIS — I5032 Chronic diastolic (congestive) heart failure: Secondary | ICD-10-CM | POA: Diagnosis not present

## 2017-03-27 DIAGNOSIS — Z91041 Radiographic dye allergy status: Secondary | ICD-10-CM

## 2017-03-27 DIAGNOSIS — M797 Fibromyalgia: Secondary | ICD-10-CM | POA: Diagnosis not present

## 2017-03-27 DIAGNOSIS — Z79891 Long term (current) use of opiate analgesic: Secondary | ICD-10-CM

## 2017-03-27 DIAGNOSIS — Z89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): Secondary | ICD-10-CM

## 2017-03-27 DIAGNOSIS — Z8679 Personal history of other diseases of the circulatory system: Secondary | ICD-10-CM

## 2017-03-27 DIAGNOSIS — Z6841 Body Mass Index (BMI) 40.0 and over, adult: Secondary | ICD-10-CM | POA: Diagnosis not present

## 2017-03-27 DIAGNOSIS — Z885 Allergy status to narcotic agent status: Secondary | ICD-10-CM

## 2017-03-27 DIAGNOSIS — Z833 Family history of diabetes mellitus: Secondary | ICD-10-CM

## 2017-03-27 DIAGNOSIS — Z89512 Acquired absence of left leg below knee: Secondary | ICD-10-CM

## 2017-03-27 DIAGNOSIS — Z8249 Family history of ischemic heart disease and other diseases of the circulatory system: Secondary | ICD-10-CM

## 2017-03-27 LAB — BLOOD GAS, ARTERIAL
Acid-Base Excess: 2.2 mmol/L — ABNORMAL HIGH (ref 0.0–2.0)
Bicarbonate: 26.9 mmol/L (ref 20.0–28.0)
Drawn by: 246861
O2 Content: 2 L/min
O2 Saturation: 94.7 %
Patient temperature: 98.6
pCO2 arterial: 47 mmHg (ref 32.0–48.0)
pH, Arterial: 7.376 (ref 7.350–7.450)
pO2, Arterial: 71.7 mmHg — ABNORMAL LOW (ref 83.0–108.0)

## 2017-03-27 LAB — GLUCOSE, CAPILLARY
Glucose-Capillary: 373 mg/dL — ABNORMAL HIGH (ref 65–99)
Glucose-Capillary: 125 mg/dL — ABNORMAL HIGH (ref 65–99)
Glucose-Capillary: 207 mg/dL — ABNORMAL HIGH (ref 65–99)

## 2017-03-27 LAB — RAPID URINE DRUG SCREEN, HOSP PERFORMED
Amphetamines: NOT DETECTED
Barbiturates: NOT DETECTED
Benzodiazepines: NOT DETECTED
Cocaine: NOT DETECTED
Opiates: NOT DETECTED
Tetrahydrocannabinol: NOT DETECTED

## 2017-03-27 MED ORDER — PREGABALIN 100 MG PO CAPS
200.0000 mg | ORAL_CAPSULE | Freq: Three times a day (TID) | ORAL | Status: DC
  Administered 2017-03-27 (×2): 200 mg via ORAL
  Filled 2017-03-27 (×2): qty 2

## 2017-03-27 MED ORDER — BACLOFEN 10 MG PO TABS: ORAL_TABLET | ORAL | 4 refills | Status: DC

## 2017-03-27 NOTE — Care Management Obs Status (Signed)
[place] NOTIFICATION   Patient Details  Name: Jennifer [name] MRN: [id number] Date of Birth: Jun 13, 1961   Medicare Observation Status Notification Given:  Yes    Carles Collet, RN 03/27/2017, 11:43 AM

## 2017-03-27 NOTE — Discharge Summary (Signed)
Name: Jennifer [name] MRN: [id number] DOB: 1961-06-04 56 y.o. PCP: Bartholomew Crews, MD  Date of Admission: 03/26/2017  3:42 PM Date of Discharge: 03/27/2017 Attending Physician: Annia Belt, MD  Discharge Diagnosis: 1.  Sedated due to multiple medications 2.  Type 2 diabetes with nephropathy  3. Severe obesity   Principal Problem:   Sedated due to multiple medications Active Problems:   DM (diabetes mellitus) type II uncontrolled, periph vascular disorder (HCC)   Hyperlipidemia associated with type 2 diabetes mellitus (HCC)   Depression, major, severe recurrence ([place])   Hypertension associated with diabetes ([place])   Chronic prescription opiate use   Severe obesity (BMI >= 40) (HCC)   Chronic diastolic heart failure (HCC)   S/P BKA (below knee amputation) unilateral (HCC)   Tobacco abuse   Atherosclerosis of aorta (HCC)   Type 2 diabetes with nephropathy ([place])   Discharge Medications: Allergies as of 03/27/2017      Reactions   Abilify [aripiprazole] Other (See Comments)   Urinary freq Nov 2016   Iohexol     Desc: IV CONTRAST CAUSE NEPHROPATHY IN 2007   Ivp Dye [iodinated Diagnostic Agents]    Morphine Sulfate Itching, Rash      Medication List    TAKE these medications   ADVAIR DISKUS 250-50 MCG/DOSE Aepb Generic drug:  Fluticasone-Salmeterol TAKE 1 INHALATION BY MOUTH TWICE DAILY   amLODipine 10 MG tablet Commonly known as:  NORVASC TAKE 1 TABLET BY MOUTH EVERY DAY   atenolol 100 MG tablet Commonly known as:  TENORMIN Take 1 tablet (100 mg total) by mouth daily.   baclofen 10 MG tablet Commonly known as:  LIORESAL Recommend taking only as needed, limiting to once daily What changed:  how much to take  how to take this  when to take this  reasons to take this  additional instructions   benazepril 40 MG tablet Commonly known as:  LOTENSIN TAKE 1 TABLET BY MOUTH ONCE DAILY   furosemide 40 MG tablet Commonly known as:  LASIX Take 1  tablet (40 mg total) by mouth daily as needed.   HUMALOG MIX 75/25 KWIKPEN (75-25) 100 UNIT/ML Kwikpen Generic drug:  Insulin Lispro Prot & Lispro Inject 85-140 Units into the skin 2 (two) times daily. 140 units before breakfast. 85 units before supper   hydrochlorothiazide 25 MG tablet Commonly known as:  HYDRODIURIL Take 1 tablet (25 mg total) by mouth daily.   ipratropium 0.02 % nebulizer solution Commonly known as:  ATROVENT USE 1 VIAL VIA NEBULIZER EVERY 6 HOURS AS NEEDED FOR WHEEZING   metFORMIN 500 MG 24 hr tablet Commonly known as:  GLUCOPHAGE XR Take 1 tablet (500 mg total) by mouth daily with breakfast.   nystatin powder Commonly known as:  MYCOSTATIN/NYSTOP APPLY TO AFFECTED AREA THREE TIMES A DAY   oxyCODONE-acetaminophen 5-325 MG tablet Commonly known as:  PERCOCET/ROXICET Take 1-2 tablets by mouth every 4 (four) hours as needed for severe pain.   pantoprazole 40 MG tablet Commonly known as:  PROTONIX TAKE 1 TABLET (40 MG TOTAL) BY MOUTH DAILY.   potassium chloride SA 20 MEQ tablet Commonly known as:  K-DUR,KLOR-CON TAKE 2 TABLETS BY MOUTH DAILY   pregabalin 200 MG capsule Commonly known as:  LYRICA Take 200 mg by mouth 3 (three) times daily.   PROAIR HFA 108 (90 Base) MCG/ACT inhaler Generic drug:  albuterol INHALE 2 PUFFS BY MOUTH EVERY 6 HOURS AS NEEDED FOR WHEEZING   albuterol (2.5 MG/3ML) 0.083% nebulizer  solution Commonly known as:  PROVENTIL INHALE THE CONTENTS OF 1 VIAL VIA NEBULIZER EVERY 6 HOURS AS NEEDED FOR WHEEZING   ranitidine 150 MG tablet Commonly known as:  ZANTAC Take 1 tablet (150 mg total) by mouth 2 (two) times daily.   rosuvastatin 20 MG tablet Commonly known as:  CRESTOR Take 20 mg by mouth at bedtime.   sertraline 100 MG tablet Commonly known as:  ZOLOFT Take 1 tablet (100 mg total) by mouth daily.       Disposition and follow-up:   [name] was discharged from [facility] in good condition.   At the hospital follow up visit please address:  1.  Set up sleep study. 2.  Dr. [name] is the patient's PCP.  Would recommend scheduling an appointment with her as well to address any other issues unable to discuss due to having a direct admission during last clinic visit with her.  2.  Labs / imaging needed at time of follow-up: none  3.  Pending labs/ test needing follow-up: none  Follow-up Appointments: Follow-up [place] Follow up on 04/09/2017.   Why:  Please follow up in the internal medicine acute care clinic to schedule your sleep study outpatient. Contact information: [address] [place] [facility] Course by problem list: Principal Problem:   Sedated due to multiple medications Active Problems:   DM (diabetes mellitus) type II uncontrolled, periph vascular disorder ([place])   Hyperlipidemia associated with type 2 diabetes mellitus (HCC)   Depression, major, severe recurrence ([place])   Hypertension associated with diabetes ([place])   Chronic prescription opiate use   Severe obesity (BMI >= 40) (HCC)   Chronic diastolic heart failure (HCC)   S/P BKA (below knee amputation) unilateral (HCC)   Tobacco abuse   Atherosclerosis of aorta (HCC)   Type 2 diabetes with nephropathy ([place])   1. Oversedation secondary to multiple medications Patient was admitted directly from the Internal medicine clinic. She appeared somnolent during the clinic visit, repeatedly falling asleep during the exam.  Baclofen and Lyrica were held on admission. Patient usually takes up to 8 Percocet tablets in a day for her chronic pain secondary to fibromyalgia. During admission Percocet was made available however patient only took one dose.  Patient was alert and engaged in conversation the morning of discharge. With her change in alertness suspect part of her somnolence is related to medications.  Also recommended a  sleep study to be done outpatient.  Patient has an outpatient follow up in the [facility] on April 19th at 1:15 to have this set up.  2. Type 2 diabetes Hemoglobin A1c 6.9. Patient takes 75/25 insulin 140 units in the morning and 85 units at night.  On admission she was continued on 75/25 insulin with decreased dose to 70 units twice a day  3. Hyperlipidemia Continued home rosuvastatin 20mg  daily  4. Hypertension Continued blood pressure medications, holding amlodipine 10mg .  Blood pressures were stable and at goal during stay.  5. Tobacco abuse Patient reports a 60-pack-year smoking history.  Counseled on smoking cessation.  Nicotine patch given during admission.  Discharge Vitals:   BP (!) 116/56 (BP Location: Right Arm)   Pulse 60   Temp 98.7 F (37.1 C) (Oral)   Resp 16   SpO2 95%    Discharge Instructions: Discharge Instructions    Diet - low sodium heart  healthy    Complete by:  As directed    Discharge instructions    Complete by:  As directed    Ms. Alaniz,  Please follow up in the internal medicine clinic on April 19 at 1:15 to set up your outpatient sleep study. Please make an appointment with your primary care doctor to discuss any need for medication changes.  And to touch base after being hospitalized.   Increase activity slowly    Complete by:  As directed       Signed: Valinda Party, DO 03/27/2017, 3:58 PM   Pager: [phone]

## 2017-03-27 NOTE — Progress Notes (Signed)
[name] to be D/C'd Home per MD order.  Discussed with the patient and all questions fully answered.  VSS, Skin clean, dry and intact without evidence of skin break down, no evidence of skin tears noted. IV catheter discontinued intact. Site without signs and symptoms of complications. Dressing and pressure applied.  An After Visit Summary was printed and given to the patient. Patient received prescription.  D/c education completed with patient/family including follow up instructions, medication list, d/c activities limitations if indicated, with other d/c instructions as indicated by MD - patient able to verbalize understanding, all questions fully answered.   Patient instructed to return to ED, call 911, or call MD for any changes in condition.   Patient refused to be escorted and D/C's with family via [facility] to exit and then home via private auto.  Christoper Fabian Bird Swetz 03/27/2017 5:22 PM
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
## 2017-03-27 NOTE — Progress Notes (Addendum)
   Subjective: Patient was evaluated this morning on rounds. She was alert and engaged in conversation. She denied any shortness of breath, chest pain or somnolence.  Objective:  Vital signs in last 24 hours: Vitals:   03/26/17 2150 03/27/17 0619 03/27/17 0656 03/27/17 1337  BP: (!) 115/54 (!) 121/34 (!) 110/56 (!) 116/56  Pulse: (!) 56 60  60  Resp: 18 17  16   Temp: 97.6 F (36.4 C) 98.2 F (36.8 C)  98.7 F (37.1 C)  TempSrc: Oral Oral  Oral  SpO2: 93% 91%  95%   Physical Exam  Constitutional: She is well-developed, well-nourished, and in no distress.  Cardiovascular: Normal rate, regular rhythm and normal heart sounds.   Pulmonary/Chest: Effort normal and breath sounds normal. No respiratory distress. She has no wheezes. She has no rales.  Neurological: She is alert.  Psychiatric: Mood, memory, affect and judgment normal.     Assessment/Plan:  Principal Problem:   Sedated due to multiple medications Active Problems:   DM (diabetes mellitus) type II uncontrolled, periph vascular disorder (HCC)   Hyperlipidemia associated with type 2 diabetes mellitus (HCC)   Depression, major, severe recurrence ([place])   Hypertension associated with diabetes ([place])   Chronic prescription opiate use   Severe obesity (BMI >= 40) (HCC)   Chronic diastolic heart failure (HCC)   S/P BKA (below knee amputation) unilateral (HCC)   Tobacco abuse   Atherosclerosis of aorta (HCC)   Type 2 diabetes with nephropathy ([place])  Oversedation secondary to multiple medications Patient was alert and engaged in conversation this morning.  Baclofen and Lyrica were held on admission. Patient usually takes up to 8 Percocet tablets in a day. Since admission yesterday patient had not received Percocet although was made available.  With her change in alertness this morning. Likely suspect part of her somnolence is related to medications.  Also recommended a sleep study to be done outpatient.  Patient has a outpatient  follow up in the [facility] on April 19th at 1:15 to have this set up.  Type 2 diabetes -Continue insulin  Hyperlipidemia -Continue rosuvastatin 20mg  daily  Hypertension -Continue blood pressure medications   Tobacco abuse Patient reports a 60-pack-year history. -nicotine patch  Code: Full FEN: Carb modified DVT/PE ppx: Lovenox subcutaneous daily  Dispo: Anticipated discharge today.  Valinda Party, DO 03/27/2017, 3:12 PM Pager: [phone]  Medicine attending: I personally examined this patient today and I concur with the evaluation and management plan as recorded above by resident physician Dr. Kalman Shan.  Please see separate attending history and physical exam for complete details. At this point I do not think her medications are responsible for her narcolepsy episodes.  I think we are dealing with a combination of insomnia and possible underlying obstructive sleep apnea.  Rule out primary narcolepsy.  She is currently awake alert and oriented.  She is stable for discharge today.  We will arrange for an outpatient sleep study. Disposition: Condition stable at time of discharge. She will continue to follow and are internal medicine clinic There were no complications

## 2017-03-28 NOTE — Progress Notes (Signed)
Subjective: Patient presents today status post partial fifth ray amputation right foot. Patient states that she's doing very well. She denies pain. Minimal edema noted.   Objective: Skin incisions well coapted sutures intact. The dehiscence amputation site has healed. Complete reepithelialization has occurred.   Assessment: Status post partial fifth ray amputation right foot. Date of surgery 01/15/2017. Ulcer right foot secondary to diabetes mellitus-resolved   Plan of care: Today the patient was evaluated. Follow-up about diabetic shoes with Liliane Channel. Return to clinic in 3  Edrick Kins, DPM [facility]  Dr. Edrick Kins, [address] [place]                                        [place], [place] [address]                Office [phone]  Fax [phone]

## 2017-03-30 ENCOUNTER — Other Ambulatory Visit: Payer: Self-pay

## 2017-03-30 DIAGNOSIS — E1151 Type 2 diabetes mellitus with diabetic peripheral angiopathy without gangrene: Secondary | ICD-10-CM | POA: Diagnosis not present

## 2017-03-30 DIAGNOSIS — E1139 Type 2 diabetes mellitus with other diabetic ophthalmic complication: Secondary | ICD-10-CM | POA: Diagnosis not present

## 2017-03-30 DIAGNOSIS — Z4781 Encounter for orthopedic aftercare following surgical amputation: Secondary | ICD-10-CM | POA: Diagnosis not present

## 2017-03-30 DIAGNOSIS — J441 Chronic obstructive pulmonary disease with (acute) exacerbation: Secondary | ICD-10-CM | POA: Diagnosis not present

## 2017-03-30 DIAGNOSIS — J449 Chronic obstructive pulmonary disease, unspecified: Secondary | ICD-10-CM | POA: Diagnosis not present

## 2017-03-30 DIAGNOSIS — E1121 Type 2 diabetes mellitus with diabetic nephropathy: Secondary | ICD-10-CM | POA: Diagnosis not present

## 2017-03-30 DIAGNOSIS — E1159 Type 2 diabetes mellitus with other circulatory complications: Secondary | ICD-10-CM | POA: Diagnosis not present

## 2017-03-30 DIAGNOSIS — E11319 Type 2 diabetes mellitus with unspecified diabetic retinopathy without macular edema: Secondary | ICD-10-CM | POA: Diagnosis not present

## 2017-03-30 DIAGNOSIS — I5032 Chronic diastolic (congestive) heart failure: Secondary | ICD-10-CM | POA: Diagnosis not present

## 2017-03-30 DIAGNOSIS — I152 Hypertension secondary to endocrine disorders: Secondary | ICD-10-CM | POA: Diagnosis not present

## 2017-03-30 DIAGNOSIS — I38 Endocarditis, valve unspecified: Secondary | ICD-10-CM | POA: Diagnosis not present

## 2017-03-30 DIAGNOSIS — H409 Unspecified glaucoma: Secondary | ICD-10-CM | POA: Diagnosis not present

## 2017-03-30 DIAGNOSIS — G894 Chronic pain syndrome: Secondary | ICD-10-CM | POA: Diagnosis not present

## 2017-03-30 DIAGNOSIS — E785 Hyperlipidemia, unspecified: Secondary | ICD-10-CM | POA: Diagnosis not present

## 2017-03-30 DIAGNOSIS — E1142 Type 2 diabetes mellitus with diabetic polyneuropathy: Secondary | ICD-10-CM | POA: Diagnosis not present

## 2017-03-30 NOTE — Patient Outreach (Signed)
[facility]) Care Management  03/30/17  KARAH CARUTHERS 06-21-1961 [id number]  Patient was readmitted on from 4/5 - 03/27/2017 under observation for sedation due to multiple medications.  Transition of care to restart to attempt to follow up with patient regarding this hospitalization. RNCM attempted to reach patient without success. Left HIPAA compliant voicemail with RNCM contact information and invited to callback. [place] R. Dalonte Hardage, RN, BSN, [place] Management Coordinator [phone]

## 2017-03-30 NOTE — Telephone Encounter (Signed)
This encounter was created in error - please disregard.

## 2017-03-31 ENCOUNTER — Telehealth: Payer: Self-pay | Admitting: Internal Medicine

## 2017-03-31 ENCOUNTER — Other Ambulatory Visit: Payer: Self-pay

## 2017-03-31 NOTE — Telephone Encounter (Signed)
Charma Igo calling from [facility] Requesting VO.  Pls call her at [phone].

## 2017-03-31 NOTE — Patient Outreach (Signed)
[facility]) Care Management  03/31/17  Jennifer [name] 1961/06/10 [id number]  Second attempt to reach patient for transition of care without success. Left HIPAA compliant voicemail with RNCM contact information and invited to call back.  [place] R. Mattis Featherly, RN, BSN, [place] Management Coordinator [phone]

## 2017-04-01 ENCOUNTER — Other Ambulatory Visit: Payer: Self-pay

## 2017-04-01 NOTE — Patient Outreach (Signed)
[facility]) Care Management  04/01/17  Jennifer [name] 07-02-1961 [id number]  Third and final outreach attempt completed without success on 08/13/2016. Left HIPAA compliant voicemail with RNCM contact information and requested callback.   Plan- RNCM will send outreach barrier letter and wait 10 business days before completing case closure if no return call has been received.  [place] R. Jadae Steinke, RN, BSN, [place] Management Coordinator [phone]

## 2017-04-01 NOTE — Telephone Encounter (Signed)
VO OK 

## 2017-04-01 NOTE — Telephone Encounter (Signed)
VO  For resumption of HHN for 1x week for 3 weeks for management of disease, do you agree?

## 2017-04-01 NOTE — Telephone Encounter (Signed)
Ann from [facility] still waiting for a nurse to call back. Please call back [phone].

## 2017-04-02 LAB — TOXASSURE SELECT,+ANTIDEPR,UR

## 2017-04-02 NOTE — Telephone Encounter (Signed)
Helen can you please close this encounter ° °

## 2017-04-06 DIAGNOSIS — E1121 Type 2 diabetes mellitus with diabetic nephropathy: Secondary | ICD-10-CM | POA: Diagnosis not present

## 2017-04-06 DIAGNOSIS — E1139 Type 2 diabetes mellitus with other diabetic ophthalmic complication: Secondary | ICD-10-CM | POA: Diagnosis not present

## 2017-04-06 DIAGNOSIS — I152 Hypertension secondary to endocrine disorders: Secondary | ICD-10-CM | POA: Diagnosis not present

## 2017-04-06 DIAGNOSIS — E1142 Type 2 diabetes mellitus with diabetic polyneuropathy: Secondary | ICD-10-CM | POA: Diagnosis not present

## 2017-04-06 DIAGNOSIS — I5032 Chronic diastolic (congestive) heart failure: Secondary | ICD-10-CM | POA: Diagnosis not present

## 2017-04-06 DIAGNOSIS — J441 Chronic obstructive pulmonary disease with (acute) exacerbation: Secondary | ICD-10-CM | POA: Diagnosis not present

## 2017-04-06 DIAGNOSIS — E785 Hyperlipidemia, unspecified: Secondary | ICD-10-CM | POA: Diagnosis not present

## 2017-04-06 DIAGNOSIS — Z4781 Encounter for orthopedic aftercare following surgical amputation: Secondary | ICD-10-CM | POA: Diagnosis not present

## 2017-04-06 DIAGNOSIS — H409 Unspecified glaucoma: Secondary | ICD-10-CM | POA: Diagnosis not present

## 2017-04-06 DIAGNOSIS — E1159 Type 2 diabetes mellitus with other circulatory complications: Secondary | ICD-10-CM | POA: Diagnosis not present

## 2017-04-06 DIAGNOSIS — E11319 Type 2 diabetes mellitus with unspecified diabetic retinopathy without macular edema: Secondary | ICD-10-CM | POA: Diagnosis not present

## 2017-04-06 DIAGNOSIS — E1151 Type 2 diabetes mellitus with diabetic peripheral angiopathy without gangrene: Secondary | ICD-10-CM | POA: Diagnosis not present

## 2017-04-06 DIAGNOSIS — G894 Chronic pain syndrome: Secondary | ICD-10-CM | POA: Diagnosis not present

## 2017-04-09 ENCOUNTER — Ambulatory Visit (INDEPENDENT_AMBULATORY_CARE_PROVIDER_SITE_OTHER): Payer: Medicare Other | Admitting: Internal Medicine

## 2017-04-09 ENCOUNTER — Encounter: Payer: Self-pay | Admitting: Internal Medicine

## 2017-04-09 VITALS — BP 190/80 | HR 64 | Temp 98.3°F | Wt 272.0 lb

## 2017-04-09 DIAGNOSIS — F17210 Nicotine dependence, cigarettes, uncomplicated: Secondary | ICD-10-CM | POA: Diagnosis not present

## 2017-04-09 DIAGNOSIS — Z79891 Long term (current) use of opiate analgesic: Secondary | ICD-10-CM | POA: Diagnosis not present

## 2017-04-09 DIAGNOSIS — J9621 Acute and chronic respiratory failure with hypoxia: Secondary | ICD-10-CM

## 2017-04-09 DIAGNOSIS — Z5189 Encounter for other specified aftercare: Secondary | ICD-10-CM

## 2017-04-09 DIAGNOSIS — Z6841 Body Mass Index (BMI) 40.0 and over, adult: Secondary | ICD-10-CM

## 2017-04-09 DIAGNOSIS — R400 Somnolence: Secondary | ICD-10-CM | POA: Diagnosis not present

## 2017-04-09 NOTE — Patient Instructions (Signed)
Ms. Gryder,  We will call you when we arrange you sleep study.   Please schedule follow up with Dr. [name] before June.

## 2017-04-09 NOTE — Progress Notes (Signed)
CC: HFU - acute on chronic respiratory failure  HPI:  Jennifer [name] is a 56 y.o. female with a past medical history listed below here today for follow up of her recent hospitalization for acute on chronic respiratory failure.  Jennifer [name] was recently seen in clinic on 03/25/2017 by Dr. [name]. At that time she was noted to be very somnolent during her visit and noted to be hypoxic down to O2 sat of 88%. When she was addressed and more alert her O2 sat would increase to ~94%. She has a history of chronic respiratory failure due to COPD with ongoing tobacco use. She is on chronic opioid therapy. Taking 2 percocet 5-325 mg q4-6hrs. She is also morbidly obese and thought to have some component of OSA contributing.   Today, she denies any further episodes of somnolence. Denis any chest pain or shortness of breath. She reports interest in getting a sleep study    Past Medical History:  Diagnosis Date  . Anginal pain [facility])    '3' of 10 ischemia ruled out 9/9   . 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 Asthma   . CHF (congestive heart failure) ([place])   . 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 Chronic bronchitis ([place])    "I get it alot" (09/28/2013)  . Chronic diastolic heart failure (HCC)    grade 2 per 2D echocardiogram (01/2013)  . Chronic lower back pain   . Chronic osteomyelitis of foot (HCC)    chronic, right secondary to diabetic foot ulcers  . Chronic pain syndrome 12/03/2011   Likely secondary to depression, "fibromyalgia", neuropathy, and obesity. Lumbar MRI 2014 no sig change from prior (2008) : Stable hypertrophic facet disease most notable at L4-5. Stable shallow left foraminal/extraforaminal disc protrusion at L4-5. No direct neural compression.      Marland Kitchen COPD 01/08/2007   PFT's 05/2007 : FEV1/FVC 82, FEV1 64% pred, FEF 25-75% 40% predicted, 16% improvement in FEV1 with bronchodilators.     . Depression   . Diabetic peripheral neuropathy ([place])   . DVT of upper  extremity (deep vein thrombosis) ([place]) 03/11/2013   Secondary to PICC line. Right brachial vein, diagnosed on 03/10/2013 Coumadin for 3 months. End date 06/10/2013   . Environmental allergies    Hx: of  . Exertional shortness of breath   . Fatty liver 2003   observed on ultrasound abdomen  . Fibromyalgia   . GERD (gastroesophageal reflux disease)   . Glaucoma   . Hyperlipidemia   . Hyperplastic colon polyp 12/2010   Per colonoscopy (12/2010) - Dr. Deatra Ina  . Hypertension   . 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 Blood cultures 01/26/13 S. Aureus and GBS. Blood cultures Feb 6th, 8th, and 9th and March were negative.Repeat TEE 3/20 negative for vegitations  . 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 2/2 tobacco use, DM, and cont weight bearing on surgical wound and developed gangrene   . Polymicrobial bacterial infection 01/2013   GBS and S. aureus bacteremia // Source likely infected diabetic foot ulcer  . PVD (peripheral vascular disease) with claudication ([place])    Stents to bilateral common iliac arteries (left 2005, right 2008), on chronic plavix  . Rheumatoid arthritis ([place])   . Tobacco abuse   . Type II diabetes mellitus with peripheral circulatory disorders, uncontrolled DX: 1993  Insulin dep. Poor control. Complicated by diabetic foot ulcer and diabetic eye disease.    Marland Kitchen Ulcer of foot, chronic (HCC)    Left. No OM per MRI (01/2013)    Review of Systems:   See HPI  Physical Exam:  Vitals:   04/09/17 1435  BP: (!) 190/80  Pulse: 64  Temp: 98.3 F (36.8 C)  TempSrc: Oral  SpO2: 95%  Weight: 272 lb (123.4 kg)   Physical Exam  Constitutional: She is oriented to person, place, and time and well-developed, well-nourished, and in no distress.  Morbidly obese female  Cardiovascular: Normal rate and regular rhythm.   Pulmonary/Chest: Effort normal  and breath sounds normal.  Abdominal: Soft. Bowel sounds are normal.  Neurological: She is alert and oriented to person, place, and time.  Skin: Skin is warm and dry.  Vitals reviewed.    Assessment & Plan:   See Encounters Tab for problem based charting.  Patient discussed with Dr. Beryle Beams

## 2017-04-13 DIAGNOSIS — H409 Unspecified glaucoma: Secondary | ICD-10-CM | POA: Diagnosis not present

## 2017-04-13 DIAGNOSIS — E11319 Type 2 diabetes mellitus with unspecified diabetic retinopathy without macular edema: Secondary | ICD-10-CM | POA: Diagnosis not present

## 2017-04-13 DIAGNOSIS — E1151 Type 2 diabetes mellitus with diabetic peripheral angiopathy without gangrene: Secondary | ICD-10-CM | POA: Diagnosis not present

## 2017-04-13 DIAGNOSIS — E1139 Type 2 diabetes mellitus with other diabetic ophthalmic complication: Secondary | ICD-10-CM | POA: Diagnosis not present

## 2017-04-13 DIAGNOSIS — E1159 Type 2 diabetes mellitus with other circulatory complications: Secondary | ICD-10-CM | POA: Diagnosis not present

## 2017-04-13 DIAGNOSIS — E1121 Type 2 diabetes mellitus with diabetic nephropathy: Secondary | ICD-10-CM | POA: Diagnosis not present

## 2017-04-13 DIAGNOSIS — G894 Chronic pain syndrome: Secondary | ICD-10-CM | POA: Diagnosis not present

## 2017-04-13 DIAGNOSIS — E785 Hyperlipidemia, unspecified: Secondary | ICD-10-CM | POA: Diagnosis not present

## 2017-04-13 DIAGNOSIS — Z4781 Encounter for orthopedic aftercare following surgical amputation: Secondary | ICD-10-CM | POA: Diagnosis not present

## 2017-04-13 DIAGNOSIS — E1142 Type 2 diabetes mellitus with diabetic polyneuropathy: Secondary | ICD-10-CM | POA: Diagnosis not present

## 2017-04-13 DIAGNOSIS — I5032 Chronic diastolic (congestive) heart failure: Secondary | ICD-10-CM | POA: Diagnosis not present

## 2017-04-13 DIAGNOSIS — J441 Chronic obstructive pulmonary disease with (acute) exacerbation: Secondary | ICD-10-CM | POA: Diagnosis not present

## 2017-04-13 DIAGNOSIS — I152 Hypertension secondary to endocrine disorders: Secondary | ICD-10-CM | POA: Diagnosis not present

## 2017-04-15 NOTE — Assessment & Plan Note (Signed)
Refer for sleep study today

## 2017-04-15 NOTE — Progress Notes (Signed)
Medicine attending: Medical history, presenting problems, physical findings, and medications, reviewed with resident physician Dr Maryellen Pile on the day of the patient visit and I concur with his evaluation and management plan.I attended to this patient during recent hospitalization. My concern is still that her somnolence is due to obesity sleep apnea syndrome and not her chronic percocets. She is quite awake and lively today. We will schedule a sleep study. I re-enforced to her that this is important. If positive, she would be a candidate for CPAP.

## 2017-04-15 NOTE — Assessment & Plan Note (Signed)
Will arrange for sleep study to assess for OSA component.

## 2017-04-23 ENCOUNTER — Other Ambulatory Visit: Payer: Self-pay

## 2017-04-23 DIAGNOSIS — E1159 Type 2 diabetes mellitus with other circulatory complications: Secondary | ICD-10-CM

## 2017-04-23 DIAGNOSIS — I10 Essential (primary) hypertension: Secondary | ICD-10-CM
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
## 2017-04-23 NOTE — Telephone Encounter (Signed)
[facility] is closed, requesting all meds to be filled @ [facility] [address].

## 2017-04-24 MED ORDER — ATENOLOL 100 MG PO TABS: 100.0000 mg | ORAL_TABLET | Freq: Every day | ORAL | 0 refills | Status: DC

## 2017-04-24 MED ORDER — HYDROCHLOROTHIAZIDE 25 MG PO TABS: 25.0000 mg | ORAL_TABLET | Freq: Every day | ORAL | 0 refills | Status: DC

## 2017-04-24 MED ORDER — ALBUTEROL SULFATE HFA 108 (90 BASE) MCG/ACT IN AERS: INHALATION_SPRAY | RESPIRATORY_TRACT | 0 refills | Status: DC

## 2017-04-24 MED ORDER — FLUTICASONE-SALMETEROL 250-50 MCG/DOSE IN AEPB: INHALATION_SPRAY | RESPIRATORY_TRACT | 0 refills | Status: DC

## 2017-04-24 MED ORDER — INSULIN LISPRO PROT & LISPRO (75-25 MIX) 100 UNIT/ML KWIKPEN: 85.0000 [IU] | PEN_INJECTOR | Freq: Two times a day (BID) | SUBCUTANEOUS | 0 refills | Status: DC

## 2017-04-24 MED ORDER — PREGABALIN 200 MG PO CAPS: 200.0000 mg | ORAL_CAPSULE | Freq: Three times a day (TID) | ORAL | 0 refills | Status: DC

## 2017-04-24 MED ORDER — BACLOFEN 10 MG PO TABS: ORAL_TABLET | ORAL | 0 refills | Status: DC

## 2017-04-24 MED ORDER — BENAZEPRIL HCL 40 MG PO TABS: 40.0000 mg | ORAL_TABLET | Freq: Every day | ORAL | 0 refills | Status: DC

## 2017-04-24 MED ORDER — FUROSEMIDE 40 MG PO TABS: 40.0000 mg | ORAL_TABLET | Freq: Every day | ORAL | 0 refills | Status: DC | PRN

## 2017-04-24 MED ORDER — AMLODIPINE BESYLATE 10 MG PO TABS: 10.0000 mg | ORAL_TABLET | Freq: Every day | ORAL | 0 refills | Status: DC

## 2017-04-24 MED ORDER — METFORMIN HCL ER 500 MG PO TB24: 500.0000 mg | ORAL_TABLET | Freq: Every day | ORAL | 0 refills | Status: DC

## 2017-04-24 NOTE — Telephone Encounter (Signed)
Lyrica rx called to [facility]. 

## 2017-04-28 ENCOUNTER — Other Ambulatory Visit: Payer: Self-pay | Admitting: Internal Medicine

## 2017-04-29 ENCOUNTER — Telehealth: Payer: Self-pay

## 2017-04-29 ENCOUNTER — Other Ambulatory Visit: Payer: Self-pay | Admitting: *Deleted

## 2017-04-29 DIAGNOSIS — I38 Endocarditis, valve unspecified: Secondary | ICD-10-CM | POA: Diagnosis not present

## 2017-04-29 DIAGNOSIS — J449 Chronic obstructive pulmonary disease, unspecified: Secondary | ICD-10-CM | POA: Diagnosis not present

## 2017-04-29 NOTE — Telephone Encounter (Signed)
Asking to speak with Jennifer [name]. Please call back.  

## 2017-04-30 MED ORDER — INSULIN LISPRO PROT & LISPRO (75-25 MIX) 100 UNIT/ML KWIKPEN: PEN_INJECTOR | SUBCUTANEOUS | 5 refills | Status: DC

## 2017-04-30 MED ORDER — GLUCOSE BLOOD VI STRP: ORAL_STRIP | 1 refills | Status: DC

## 2017-04-30 NOTE — Telephone Encounter (Signed)
Spoke w/ pt, sent requests to pcp

## 2017-05-04 ENCOUNTER — Other Ambulatory Visit: Payer: Self-pay

## 2017-05-04 NOTE — Patient Outreach (Signed)
[facility]) Care Management  05/04/17  [name] [birth year] [id number]  RNCM has not had successful outreach with patient since sending outreach attempt letter. Will perform case closure due to not being able to make contact with patient.  [name], RN, BSN, [place] Management Coordinator [phone]

## 2017-05-07 ENCOUNTER — Ambulatory Visit (INDEPENDENT_AMBULATORY_CARE_PROVIDER_SITE_OTHER): Payer: Medicare Other | Admitting: Internal Medicine

## 2017-05-07 ENCOUNTER — Encounter: Payer: Self-pay | Admitting: Internal Medicine

## 2017-05-07 ENCOUNTER — Telehealth: Payer: Self-pay | Admitting: *Deleted

## 2017-05-07 VITALS — BP 141/60 | HR 54 | Temp 98.1°F | Wt 274.7 lb

## 2017-05-07 DIAGNOSIS — Z79891 Long term (current) use of opiate analgesic: Secondary | ICD-10-CM

## 2017-05-07 DIAGNOSIS — M79604 Pain in right leg: Secondary | ICD-10-CM

## 2017-05-07 DIAGNOSIS — Z6841 Body Mass Index (BMI) 40.0 and over, adult: Secondary | ICD-10-CM | POA: Diagnosis not present

## 2017-05-07 DIAGNOSIS — I8390 Asymptomatic varicose veins of unspecified lower extremity: Secondary | ICD-10-CM

## 2017-05-07 NOTE — Assessment & Plan Note (Addendum)
This problem is chronic and stable. I encouraged her to keep her appointment in June as she needs prescription for her oxycodone in June. I plan to continue the taper are propane seems to be quite well controlled. She does not seem to be incurring any negative effects to the slow taper and she actually seems in a much better state of mind on this lower dose than previously.  Plan: Follow-up in June Continue opioid taper   She has a new complaint today of right lower extremity pain. It is a sharp pain on the medial leg from the distal calf to the medial malleolus. It has been going on for a couple of weeks and was not preceded by any injury. It is intermittent and will wake her up in the middle of the night sometimes. It last hours. Walking or standing does not increase the pain. Nothing makes it better including her oxycodone. She has tried something like an icy hot without any relief. On examination, there is nothing to suggest cellulitis. She does have changes consistent with chronic venous insufficiency and some varicose veins and I wonder if varicose veins could be causing this pain. She hasn't seen her vascular doctor in about 2 years and I am going to refer her back there for evaluation.  Plan: Referral back to vascular surgery to consider if varicose veins are causing her pain.

## 2017-05-07 NOTE — Telephone Encounter (Signed)
Contacted [facility] earlier this today and left message for scheduling.  Pt needs an appt.Despina Hidden Cassady5/17/20184:42 PM

## 2017-05-07 NOTE — Assessment & Plan Note (Signed)
At her last admission, a sleep related disorder had been brought up including sleep apnea or narcolepsy as the cause of her somnolence. She has a sleep study scheduled in June 19 and I will follow up those results.  Plan: Follow up sleep study results

## 2017-05-07 NOTE — Patient Instructions (Signed)
1. Keep your June appt with me 2. I will check you for varicose veins as that might be causing your pain

## 2017-05-07 NOTE — Progress Notes (Signed)
   Subjective:    [name], female    DOB: 06-Apr-1961, 56 y.o.   MRN: [id number]  HPI  Jennifer [name] is here for R leg pain. Please see the A&P for the status of the pt's chronic medical problems.  ROS : per ROS section and in problem oriented charting. All other systems are negative.  PMHx, Soc hx, and / or Fam hx : Planning trip to [place], her hometown, for month of July.  Review of Systems  Constitutional: Negative for chills and fever.  HENT: Positive for tinnitus. Negative for congestion, dental problem, ear pain, mouth sores, rhinorrhea and sneezing.   Respiratory: Negative for shortness of breath and wheezing.   Musculoskeletal: Positive for gait problem.       R leg pain  Skin: Positive for color change and wound.  Neurological: Positive for headaches.  Psychiatric/Behavioral: Positive for sleep disturbance.       Objective:   Physical Exam  Constitutional: She appears well-developed and well-nourished. No distress.  HENT:  Head: Normocephalic and atraumatic.  Right Ear: External ear normal.  Left Ear: External ear normal.  Nose: Nose normal.  Eyes: Conjunctivae and EOM are normal.  Skin: Skin is warm and dry. She is not diaphoretic.  Her right leg from distal calf to her medial malleolus is not warm. There is slight hyperpigmentation/pink nevus but no frank erythema. There are varicose veins on the medial and anterior surface. There are changes consistent with chronic venous insufficiency and some circular scars.  Psychiatric: She has a normal mood and affect. Her behavior is normal. Judgment and thought content normal.          Assessment & Plan:

## 2017-05-12 ENCOUNTER — Telehealth: Payer: Self-pay

## 2017-05-12 NOTE — Telephone Encounter (Signed)
Pt states she had went [place] [employer] [place] with her father in his truck. She had Lyrica and Oxycodone in her pocketbook. And when looked in her pocketbook when she arrived home, she did not have either medications. So, her father found Oxycodone in the truck but not Lyrica.  Said she even called  Social services. And now she's in a lot of pain. Pleased advise Thanks

## 2017-05-12 NOTE — Telephone Encounter (Signed)
Requesting to speak with a nurse Lyrica. Please call pt back.

## 2017-05-13 ENCOUNTER — Encounter: Payer: Self-pay | Admitting: Vascular Surgery

## 2017-05-13 ENCOUNTER — Encounter (HOSPITAL_COMMUNITY): Payer: Medicare Other

## 2017-05-13 LAB — GLUCOSE, POCT (MANUAL RESULT ENTRY): POC Glucose: 228 mg/dl — AB (ref 70–99)

## 2017-05-13 MED ORDER — PREGABALIN 200 MG PO CAPS: 200.0000 mg | ORAL_CAPSULE | Freq: Three times a day (TID) | ORAL | 5 refills | Status: DC

## 2017-05-13 NOTE — Telephone Encounter (Signed)
[facility] I called pt to let her know about new rx - stated she found the medication.

## 2017-05-13 NOTE — Telephone Encounter (Signed)
Lyrica ordered. pls phone in. I have no control whether her insurance covers the early RF

## 2017-05-25 ENCOUNTER — Other Ambulatory Visit: Payer: Self-pay | Admitting: Internal Medicine

## 2017-05-25 DIAGNOSIS — E1159 Type 2 diabetes mellitus with other circulatory complications: Secondary | ICD-10-CM

## 2017-05-25 DIAGNOSIS — I10 Essential (primary) hypertension: Secondary | ICD-10-CM

## 2017-05-25 NOTE — Telephone Encounter (Signed)
[facility]?
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
## 2017-05-25 NOTE — Addendum Note (Signed)
Addendum  created 05/25/17 1029 by Oleta Mouse, MD   Sign clinical note

## 2017-05-26 ENCOUNTER — Encounter: Payer: Medicare Other | Admitting: Vascular Surgery

## 2017-05-29 ENCOUNTER — Other Ambulatory Visit: Payer: Self-pay | Admitting: Internal Medicine

## 2017-05-30 DIAGNOSIS — J449 Chronic obstructive pulmonary disease, unspecified: Secondary | ICD-10-CM | POA: Diagnosis not present

## 2017-05-30 DIAGNOSIS — I38 Endocarditis, valve unspecified: Secondary | ICD-10-CM | POA: Diagnosis not present

## 2017-06-01 ENCOUNTER — Other Ambulatory Visit: Payer: Self-pay | Admitting: *Deleted

## 2017-06-01 MED ORDER — ALBUTEROL SULFATE HFA 108 (90 BASE) MCG/ACT IN AERS: INHALATION_SPRAY | RESPIRATORY_TRACT | 11 refills | Status: DC

## 2017-06-02 ENCOUNTER — Other Ambulatory Visit: Payer: Self-pay | Admitting: *Deleted

## 2017-06-02 ENCOUNTER — Telehealth: Payer: Self-pay

## 2017-06-02 NOTE — Telephone Encounter (Signed)
[facility] [address]. Would like a call back from the nurse.

## 2017-06-03 MED ORDER — INSULIN PEN NEEDLE 31G X 8 MM MISC: 100.0000 | Freq: Two times a day (BID) | 5 refills | Status: DC

## 2017-06-03 NOTE — Telephone Encounter (Signed)
Spoke w/ pt and pharmacy last evening, went over whole med list w/ pharmacist, 1 refill request sent to dr Software engineer

## 2017-06-04 ENCOUNTER — Encounter: Payer: Self-pay | Admitting: Internal Medicine

## 2017-06-04 ENCOUNTER — Ambulatory Visit (INDEPENDENT_AMBULATORY_CARE_PROVIDER_SITE_OTHER): Payer: Medicare Other | Admitting: Internal Medicine

## 2017-06-04 VITALS — BP 132/53 | HR 67 | Temp 98.7°F | Ht 67.0 in | Wt 275.9 lb

## 2017-06-04 DIAGNOSIS — I152 Hypertension secondary to endocrine disorders: Secondary | ICD-10-CM

## 2017-06-04 DIAGNOSIS — Z79891 Long term (current) use of opiate analgesic: Secondary | ICD-10-CM | POA: Diagnosis not present

## 2017-06-04 DIAGNOSIS — Z79899 Other long term (current) drug therapy: Secondary | ICD-10-CM

## 2017-06-04 DIAGNOSIS — R220 Localized swelling, mass and lump, head: Secondary | ICD-10-CM

## 2017-06-04 DIAGNOSIS — I10 Essential (primary) hypertension: Secondary | ICD-10-CM

## 2017-06-04 DIAGNOSIS — Z89511 Acquired absence of right leg below knee: Secondary | ICD-10-CM

## 2017-06-04 DIAGNOSIS — F17210 Nicotine dependence, cigarettes, uncomplicated: Secondary | ICD-10-CM | POA: Diagnosis not present

## 2017-06-04 DIAGNOSIS — F1120 Opioid dependence, uncomplicated: Secondary | ICD-10-CM

## 2017-06-04 DIAGNOSIS — E1159 Type 2 diabetes mellitus with other circulatory complications: Secondary | ICD-10-CM

## 2017-06-04 DIAGNOSIS — Z89519 Acquired absence of unspecified leg below knee: Secondary | ICD-10-CM

## 2017-06-04 DIAGNOSIS — Z1239 Encounter for other screening for malignant neoplasm of breast: Secondary | ICD-10-CM

## 2017-06-04 MED ORDER — OXYCODONE-ACETAMINOPHEN 5-325 MG PO TABS: 1.0000 | ORAL_TABLET | ORAL | 0 refills | Status: DC | PRN

## 2017-06-04 NOTE — Progress Notes (Signed)
   Subjective:    [name], female    DOB: Apr 11, 1961, 56 y.o.   MRN: [id number]  HPI  Jennifer [name] is here for chronic pain F/U. Please see the A&P for the status of the pt's chronic medical problems.  ROS : per ROS section and in problem oriented charting. All other systems are negative.  PMHx, Soc hx, and / or Fam hx : Her son who doesn't help her, another son that does help, his Girlfriend, and the girlfriend's kids live with her in her apartment. The sone, girlfriend, and kids will be moving out next month when they get their own apartment and will be marring in Sept. She is not going to be able to go to [place] to visit relatives as she had planned as they live in a three-story building and she cannot walk about the stairs.  Review of Systems  Gastrointestinal:       Dysphagia to liquids and in the cervical region  Musculoskeletal: Positive for arthralgias, back pain, gait problem and joint swelling.  Skin:       Complains of right cheek swelling and skin firmness for the past 5 years  Psychiatric/Behavioral: Positive for sleep disturbance.      Objective:   Physical Exam  Constitutional: She appears well-developed and well-nourished. No distress.  HENT:  Head: Normocephalic and atraumatic.  Right Ear: External ear normal.  Left Ear: External ear normal.  Nose: Nose normal.  There is very minimal fullness of the right cheek compared to the left. There is no appreciable change in skin texture from the right to the left. There is no mass. She has no teeth. Her facial strength is normal and symmetric as is her sensation.  Skin: Skin is warm and dry. She is not diaphoretic.  Psychiatric: She has a normal mood and affect. Her behavior is normal. Judgment and thought content normal.      Assessment & Plan:

## 2017-06-04 NOTE — Assessment & Plan Note (Addendum)
This problem is chronic and stable. She is oxycodone 5 mg/APAP 325 mg. She is on a very slow taper for the past 2 years. When she started, she was on 102 morphine equivalents a day. Currently she is on 73 MEQ a day, 234 per month, 7.8 a day.  I am decreasing 4 pills every month. It will take about another year to get her down to less than 50 MEQ a day. Over the past many appointments, she always appeared to be comfortable without pain and actually would not even bring up her pain. When she saw podiatry recently, it was reported that she had no pain at all. Today, she again appeared quite comfortable.  When talking about her right cheek, I asked if she had any pain, intending this to be only about her right cheek, which is when she then stated she was in pain all over and asked me not to continue the taper as her pain was so uncontrolled. I did a PEG and her scores were 10 / 10 / 6.  This just does not seem congruent with her appearance and also what she states that she can do. She uses a walker to get out of her apartment and is able to go down steps. If she uses SCAT, she gets in her wheelchair. Otherwise she walks to her father's pickup truck and is able to get in. So subjectively she states she is in severe 10 out of 10 pain. Objectively she appears much better now on a lower opioid dose than previously. Before the taper, pain occupied every single conversation we had. Now pain rarely comes up. Therefore, I am going to continue the opioid taper with a goal of getting her to 50 MEQ a day. I had plan to get a new contract today that I did not think that this is the day to broach this.  Her UDS 03/26/17 and [place] database are appropriate.   PLAN : I gave three scripts for oxycodone for #230, 226, and 222

## 2017-06-04 NOTE — Patient Instructions (Signed)
1. See me in 2 months. 2. We will watch your right cheek 3. I ordered a mammogram 4. Please get your eyes checked.

## 2017-06-05 NOTE — Assessment & Plan Note (Signed)
This problem is chronic and improved. Her medication regimen is amlodipine 10 mg, atenolol 100 mg, benazepril 40 mg, hydrochlorothiazide 25 mg, Lasix when necessary. When asked how she got her blood pressure at goal today, she stated it is because she took all of her medications. This confirms what I have followed that inability to take medications consistently as the etiology of her uncontrolled hypertension. She has no side effects to these medications other than some urinary frequency. 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  PLAN:  Cont current meds   BP Readings from Last 3 Encounters:  06/04/17 (!) 132/53  05/13/17 (!) 163/82  05/07/17 (!) 141/60

## 2017-06-05 NOTE — Assessment & Plan Note (Signed)
This problem is chronic and stable. We had an appointment recently with [facility] to do power chair assessment. She now states that she wants a power chair that she can disassemble to put into a vehicle. She states she has called [facility] regarding this but has not heard back. She has been submitting monthly payments for the power chair. I will contact our front desk staff to figure out what needs to be done to give her her power chair.  PLAN : Discuss with [place]

## 2017-06-09 ENCOUNTER — Ambulatory Visit (HOSPITAL_BASED_OUTPATIENT_CLINIC_OR_DEPARTMENT_OTHER): Payer: Medicare Other | Attending: Oncology | Admitting: Internal Medicine

## 2017-06-09 DIAGNOSIS — G4733 Obstructive sleep apnea (adult) (pediatric): Secondary | ICD-10-CM

## 2017-06-09 DIAGNOSIS — G4736 Sleep related hypoventilation in conditions classified elsewhere: Secondary | ICD-10-CM

## 2017-06-09 DIAGNOSIS — R0902 Hypoxemia: Secondary | ICD-10-CM | POA: Diagnosis not present

## 2017-06-12 ENCOUNTER — Other Ambulatory Visit: Payer: Self-pay | Admitting: Internal Medicine

## 2017-06-12 DIAGNOSIS — G4733 Obstructive sleep apnea (adult) (pediatric): Secondary | ICD-10-CM | POA: Insufficient documentation

## 2017-06-12 DIAGNOSIS — G4734 Idiopathic sleep related nonobstructive alveolar hypoventilation: Secondary | ICD-10-CM | POA: Insufficient documentation

## 2017-06-12 NOTE — Procedures (Signed)
Patient Name: Jennifer [name], Jennifer [name] Date: 06/09/2017 Gender: Female D.O.B: [birth year] Age (years): 56 Referring Provider: Murriel Hopper Height (inches): 60 Interpreting Physician: Baird Lyons MD, ABSM Weight (lbs): 272 RPSGT: Carolin Coy BMI: 35 MRN: [id number] Neck Size: 15.50 CLINICAL INFORMATION Sleep Study Type: Split Night CPAP  Indication for sleep study: Congestive Heart Failure, COPD, Diabetes, Fatigue, Hypertension, Obesity  Epworth Sleepiness Score: 2  SLEEP STUDY TECHNIQUE As per the AASM Manual for the Scoring of Sleep and Associated Events v2.3 (April 2016) with a hypopnea requiring 4% desaturations.  The channels recorded and monitored were frontal, central and occipital EEG, electrooculogram (EOG), submentalis EMG (chin), nasal and oral airflow, thoracic and abdominal wall motion, anterior tibialis EMG, snore microphone, electrocardiogram, and pulse oximetry. Continuous positive airway pressure (CPAP) was initiated when the patient met split night criteria and was titrated according to treat sleep-disordered breathing.  MEDICATIONS Medications self-administered by patient taken the night of the study : ADVAIR DISKUS, ALBUTEROL, HUMALOG MIX 75/25, BENADRYL, OXYCODONE HCL & ACETAMINOPHEN, LYRICA, CRESTOR  RESPIRATORY PARAMETERS Diagnostic  Total AHI (/hr): 19.5 RDI (/hr): 19.9 OA Index (/hr): 1 CA Index (/hr): 0.0 REM AHI (/hr): 17.1 NREM AHI (/hr): 20.2 Supine AHI (/hr): 32.8 Non-supine AHI (/hr): 15.00 Min O2 Sat (%): 70.00 Mean O2 (%): 81.56 Time below 88% (min): 172.2   Titration  Optimal Pressure (cm): 12 AHI at Optimal Pressure (/hr): 0.0 Min O2 at Optimal Pressure (%): 84.0 Supine % at Optimal (%): 0 Sleep % at Optimal (%): 100   SLEEP ARCHITECTURE The recording time for the entire night was 364.5 minutes.  During a baseline period of 173.9 minutes, the patient slept for 172.3 minutes in REM and nonREM, yielding a sleep efficiency of 99.1%.  Sleep onset after lights out was 0.0 minutes with a REM latency of 134.3 minutes. The patient spent 1.16% of the night in stage N1 sleep, 86.65% in stage N2 sleep, 0.00% in stage N3 and 12.19% in REM.  During the titration period of 170.4 minutes, the patient slept for 163.7 minutes in REM and nonREM, yielding a sleep efficiency of 96.0%. Sleep onset after CPAP initiation was 3.7 minutes with a REM latency of 73.5 minutes. The patient spent 8.86% of the night in stage N1 sleep, 56.02% in stage N2 sleep, 0.00% in stage N3 and 35.12% in REM.  CARDIAC DATA The 2 lead EKG demonstrated sinus rhythm. The mean heart rate was 59.77 beats per minute. Other EKG findings include: PVCs, PACs. LEG MOVEMENT DATA The total Periodic Limb Movements of Sleep (PLMS) were 0. The PLMS index was 0.00 .  IMPRESSIONS - Moderate obstructive sleep apnea occurred during the diagnostic portion of the study(AHI = 19.5/hour). An optimal PAP pressure was selected for this patient ( 12 cm of water) - No significant central sleep apnea occurred during the diagnostic portion of the study (CAI = 0.0/hour). - Severe oxygen desaturation was noted during the diagnostic portion of the study (Min O2 = 70.00%). Supplemental O2 was titrated per protocol for persistent saturations  - The patient snored with Moderate snoring volume during the diagnostic portion of the study. - EKG findings include PVCs. - Clinically significant periodic limb movements did not occur during sleep.  DIAGNOSIS - Obstructive Sleep Apnea (327.23 [G47.33 ICD-10]) - Nocturnal Hypoxemia (327.26 [G47.36 ICD-10])  RECOMMENDATIONS - Trial of CPAP therapy on 12 cm H2O with a Small size Fisher&Paykel Full Face Mask Simplus mask and heated humidification. - The patient qualifies for supplemental O2 at 3L/ min in  addition to CPAP on this attended study. - Avoid alcohol, sedatives and other CNS depressants that may worsen sleep apnea and disrupt normal sleep  architecture. - Sleep hygiene should be reviewed to assess factors that may improve sleep quality. - Weight management and regular exercise should be initiated or continued.  [Electronically signed] 06/12/2017 03:28 PM  Baird Lyons MD, [place], American Board of Sleep Medicine   NPI: [id number] [place], [place] of Sleep Medicine  ELECTRONICALLY SIGNED ON:  06/12/2017, 3:23 PM [place] PH: [phone]   FX: [phone] [place]

## 2017-06-19 ENCOUNTER — Telehealth: Payer: Self-pay | Admitting: Podiatry

## 2017-06-19 NOTE — Telephone Encounter (Signed)
Yes someone called me from this number and I'm just returning the call. Just give me a call back. I know I have an appointment on Monday 02 July.

## 2017-06-22 ENCOUNTER — Ambulatory Visit: Payer: Medicare Other

## 2017-06-22 ENCOUNTER — Ambulatory Visit (INDEPENDENT_AMBULATORY_CARE_PROVIDER_SITE_OTHER): Payer: Medicare Other | Admitting: Podiatry

## 2017-06-22 DIAGNOSIS — M79676 Pain in unspecified toe(s): Secondary | ICD-10-CM

## 2017-06-22 DIAGNOSIS — E0842 Diabetes mellitus due to underlying condition with diabetic polyneuropathy: Secondary | ICD-10-CM

## 2017-06-22 DIAGNOSIS — B351 Tinea unguium: Secondary | ICD-10-CM | POA: Diagnosis not present

## 2017-06-24 NOTE — Progress Notes (Signed)
   SUBJECTIVE Patient with a history of diabetes mellitus presents to office today complaining of elongated, thickened nails. Pain while ambulating in shoes. Patient is unable to trim their own nails.   OBJECTIVE General Patient is awake, alert, and oriented x 3 and in no acute distress. Derm Skin is dry and supple bilateral. Negative open lesions or macerations. Remaining integument unremarkable. Nails are tender, long, thickened and dystrophic with subungual debris, consistent with onychomycosis, 1-5 bilateral. No signs of infection noted. Vasc  DP and PT pedal pulses palpable bilaterally. Temperature gradient within normal limits.  Neuro Epicritic and protective threshold sensation diminished bilaterally.  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  ASSESSMENT 1. Diabetes Mellitus w/ peripheral neuropathy 2. Onychomycosis of nail due to dermatophyte bilateral 3. Pain in foot bilateral  PLAN OF CARE 1. Patient evaluated today. 2. Instructed to maintain good pedal hygiene and foot care. Stressed importance of controlling blood sugar.  3. Mechanical debridement of nails 1-5 bilaterally performed using a nail nipper. Filed with dremel without incident.  4. Authorization for DM shoes initiated today. Pt molded for DM inserts. 5. Return to clinic in 3 months.    Edrick Kins, DPM [facility]  Dr. Edrick Kins, [address]                                        [place], [place] [address]                Office [phone]  Fax [phone]

## 2017-06-29 DIAGNOSIS — J449 Chronic obstructive pulmonary disease, unspecified: Secondary | ICD-10-CM | POA: Diagnosis not present

## 2017-06-29 DIAGNOSIS — I38 Endocarditis, valve unspecified: Secondary | ICD-10-CM | POA: Diagnosis not present

## 2017-06-30 ENCOUNTER — Other Ambulatory Visit: Payer: Self-pay | Admitting: Internal Medicine

## 2017-07-03 NOTE — Telephone Encounter (Signed)
[facility]

## 2017-07-03 NOTE — Telephone Encounter (Signed)
Pls phone in lyrica. Thanks

## 2017-07-06 NOTE — Telephone Encounter (Signed)
Lyrica rx called to [facility].

## 2017-07-07 ENCOUNTER — Telehealth: Payer: Self-pay | Admitting: Internal Medicine

## 2017-07-07 NOTE — Telephone Encounter (Signed)
F/u with Andria Rhein and the patient in reference to her PWC concerns.  Andria Rhein to call the patent about her concerns and patient was notified and will call us back if she has any other questions.

## 2017-07-14 ENCOUNTER — Other Ambulatory Visit: Payer: Self-pay | Admitting: *Deleted

## 2017-07-14 NOTE — Patient Outreach (Signed)
Request received from Joanna Saporito, LCSW to mail patient personal care resources.  Information mailed today. 

## 2017-07-14 NOTE — Patient Outreach (Signed)
[facility]) Care Management  07/14/2017  Jennifer [name] [birth year] [id number]   CSW received a call from Valente David, [facility] with [facility] [employer], while present at patient's home for a routine home visit.  Jennifer [name] is aware that CSW has been unsuccessful in being able to make initial contact with patient to perform phone assessment, as well as assess and assist with social work needs and services, agreeing to contact CSW while present at patient's home so that [place] would have an opportunity to converse with patient and discuss needs.  Patient denied wanting any type of home care services, such as CAPS Forensic scientist) through the [place], and/or [employer] ([place]) through [employer]), despite patient being an Adult Medicaid recipient. However, patient was interested in receiving information about Armed forces technical officer, with [employer] of [place], [place] [employer] through the [place], and [employer] (Engineering geologist) through [employer] ([employer]).  Patient should already be familiar with Senior Wheels, SCAT and Adult Medicaid, as she has utilized all of these transportation resources in the past, on several different occasions. Last, CSW spoke with patient about an Adult Day Care Program through [facility] ([facility] [place] [facility]).  Patient appeared to be somewhat reluctant to relinquish her Primary Care Physician, Dr. Larey Dresser, as this would be a requirement of [facility].  CSW also briefly spoke with patient about long-term care placement arrangements in an assisted living facility.  Patient did not have much to say at all above all of the above named resources, but was receptive to receiving information in the mail.  CSW agreed to mail the following resources to  patient: Programmer, systems for [employer] for [employer] for [employer] for [employer] of Assisted Living Facilities in [facility] for [employer] for [employer] Patient has CSW's contact information and has been encouraged to contact CSW directly if she does not receive the packet of resource information within the next week.  Patient has also been encouraged to contact CSW if she has questions about information provided in the resource packet or if she needs assistance with completion of applications.  CSW will refrain from opening patient's case at this time, as all goals of treatment have been met from CSW standpoint. Nat Christen, BSW, MSW, LCSW  Licensed Education officer, environmental Health System  Mailing [place] [address] [place], [place] [address] Physical Address-[address], [place], [place] [address] Toll Free Main # [phone] Fax # [phone] Cell # [phone]  Office # [phone] [name]

## 2017-07-24 ENCOUNTER — Telehealth: Payer: Self-pay | Admitting: Podiatry

## 2017-07-24 NOTE — Telephone Encounter (Signed)
lvm for pt to call to schedule an appt to pick up diabetic shoes °

## 2017-07-28 ENCOUNTER — Other Ambulatory Visit: Payer: Self-pay | Admitting: Student in an Organized Health Care Education/Training Program

## 2017-07-30 DIAGNOSIS — J449 Chronic obstructive pulmonary disease, unspecified: Secondary | ICD-10-CM | POA: Diagnosis not present

## 2017-07-30 DIAGNOSIS — I38 Endocarditis, valve unspecified: Secondary | ICD-10-CM | POA: Diagnosis not present

## 2017-08-03 ENCOUNTER — Encounter: Payer: Self-pay | Admitting: Vascular Surgery

## 2017-08-03 ENCOUNTER — Telehealth: Payer: Self-pay | Admitting: *Deleted

## 2017-08-03 NOTE — Telephone Encounter (Signed)
I am just now able to return your phone call to schedule an appointment to get my shoes.  Please call me back.

## 2017-08-03 NOTE — Telephone Encounter (Signed)
I just missed your call about scheduling my appointment please call me back.

## 2017-08-06 ENCOUNTER — Ambulatory Visit (INDEPENDENT_AMBULATORY_CARE_PROVIDER_SITE_OTHER): Payer: Medicare Other | Admitting: Internal Medicine

## 2017-08-06 ENCOUNTER — Encounter: Payer: Self-pay | Admitting: Internal Medicine

## 2017-08-06 ENCOUNTER — Other Ambulatory Visit: Payer: Self-pay

## 2017-08-06 VITALS — BP 160/66 | HR 68 | Temp 98.0°F | Ht 67.0 in | Wt 272.0 lb

## 2017-08-06 DIAGNOSIS — F1120 Opioid dependence, uncomplicated: Secondary | ICD-10-CM

## 2017-08-06 DIAGNOSIS — Z993 Dependence on wheelchair: Secondary | ICD-10-CM

## 2017-08-06 DIAGNOSIS — R45851 Suicidal ideations: Secondary | ICD-10-CM

## 2017-08-06 DIAGNOSIS — Z79891 Long term (current) use of opiate analgesic: Secondary | ICD-10-CM | POA: Diagnosis not present

## 2017-08-06 DIAGNOSIS — Z833 Family history of diabetes mellitus: Secondary | ICD-10-CM | POA: Diagnosis not present

## 2017-08-06 DIAGNOSIS — IMO0002 Reserved for concepts with insufficient information to code with codable children: Secondary | ICD-10-CM

## 2017-08-06 DIAGNOSIS — E1151 Type 2 diabetes mellitus with diabetic peripheral angiopathy without gangrene: Secondary | ICD-10-CM

## 2017-08-06 DIAGNOSIS — Z592 Discord with neighbors, lodgers and landlord: Secondary | ICD-10-CM | POA: Diagnosis not present

## 2017-08-06 DIAGNOSIS — F332 Major depressive disorder, recurrent severe without psychotic features: Secondary | ICD-10-CM | POA: Diagnosis not present

## 2017-08-06 DIAGNOSIS — Z79899 Other long term (current) drug therapy: Secondary | ICD-10-CM

## 2017-08-06 DIAGNOSIS — E1165 Type 2 diabetes mellitus with hyperglycemia: Principal | ICD-10-CM

## 2017-08-06 DIAGNOSIS — G4733 Obstructive sleep apnea (adult) (pediatric): Secondary | ICD-10-CM

## 2017-08-06 LAB — POCT GLYCOSYLATED HEMOGLOBIN (HGB A1C): Hemoglobin A1C: 8.1

## 2017-08-06 LAB — GLUCOSE, CAPILLARY: Glucose-Capillary: 263 mg/dL — ABNORMAL HIGH (ref 65–99)

## 2017-08-06 MED ORDER — OXYCODONE-ACETAMINOPHEN 5-325 MG PO TABS: 1.0000 | ORAL_TABLET | ORAL | 0 refills | Status: DC | PRN

## 2017-08-06 MED ORDER — BUPROPION HCL ER (XL) 150 MG PO TB24: 150.0000 mg | ORAL_TABLET | Freq: Every day | ORAL | 0 refills | Status: DC

## 2017-08-06 NOTE — Assessment & Plan Note (Signed)
This problem is chronic and uncontrolled She is on an oxycodone taper and is now on 5 mg #222. Her oxycodone is undoubtedly tx her depression sxs. Due to her acute worsening of her depression, I am halting the taper temp. I printed off 3 Rx but only gave her one Rx due to returning in 2-4 wks, uncontrolled depression, and passive SI.  PLAN : oxycodone APAP 5 mg #222
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
## 2017-08-06 NOTE — Patient Outreach (Signed)
[name]) Care Management  08/06/17  Jennifer [name] [birth year] [id number]  RNCM received voicemail from patient on 08/05/2017 requesting callback, stating that she is "getting ready to face being homeless."  Successful outreach completed with patient, patient identification verified.   Patient stated that she is being evicted from her home in 10 days. She received eviction notice on Monday and went to court, where the judge told her to fill out paperwork to appeal the eviction. She stated that once the appeal was filed, they could not do anything until they go back to court for the appeal. She stated that they are evicting her because her grandchildren had come to stay with her some over the summer. She voiced frustration, stating that they were not staying with her 24/7. She stated that she cannot do anything right now because she does not have any money until she gets her check on the 3rd of September. Patient has the paperwork for the appeal and plans to return it tomorrow to file the appeal.   RNCM assessed patient's interest in looking at assisted living facilities and patient declined. She stated she is not ready for anything like that yet. She just wants to make sure she has somewhere to live. She stated she has never been evicted before. RNCM offered support.   Call was cut short as patient stated she was no her way to a doctor's appointment and was on the [employer] bus. RNCM encouraged patient to make sure she fills out the information for the appeal and turns it in as soon as possible. RNCM to consult with [facility] SW regarding any other options/resources and will get back to patient within the next 1-2 business days. Encouraged patient to call RNCM with any additional needs/concerns if needed and she was agreeable.   [name], RN, BSN, [facility] Management Coordinator [phone]

## 2017-08-06 NOTE — Patient Instructions (Addendum)
1. Please return to see me in 2-4 weeks 2. Please call me or 911 if you are thinking of hurting yourself 3. I will let [facility] know about your housing troubles 4. Start the wellbutrin

## 2017-08-06 NOTE — Assessment & Plan Note (Signed)
This problem is new. We reviewed her sleep study results. She tolerated the mask and is agreeable to getting a home machine although I am concerned about her co pay. I had ordered it in June - will check with front desk.  PLAN : F/U CPAP

## 2017-08-11 ENCOUNTER — Other Ambulatory Visit: Payer: Self-pay | Admitting: Internal Medicine

## 2017-08-11 ENCOUNTER — Encounter: Payer: Medicare Other | Admitting: Vascular Surgery

## 2017-08-11 DIAGNOSIS — I152 Hypertension secondary to endocrine disorders: Secondary | ICD-10-CM

## 2017-08-11 DIAGNOSIS — I10 Essential (primary) hypertension: Principal | ICD-10-CM

## 2017-08-11 DIAGNOSIS — E1159 Type 2 diabetes mellitus with other circulatory complications: Secondary | ICD-10-CM

## 2017-08-12 ENCOUNTER — Ambulatory Visit (INDEPENDENT_AMBULATORY_CARE_PROVIDER_SITE_OTHER): Payer: Medicare Other | Admitting: Orthotics

## 2017-08-12 DIAGNOSIS — M79676 Pain in unspecified toe(s): Secondary | ICD-10-CM | POA: Diagnosis not present

## 2017-08-12 DIAGNOSIS — B351 Tinea unguium: Secondary | ICD-10-CM

## 2017-08-12 DIAGNOSIS — L97512 Non-pressure chronic ulcer of other part of right foot with fat layer exposed: Secondary | ICD-10-CM | POA: Diagnosis not present

## 2017-08-12 DIAGNOSIS — E0843 Diabetes mellitus due to underlying condition with diabetic autonomic (poly)neuropathy: Secondary | ICD-10-CM | POA: Diagnosis not present

## 2017-08-12 NOTE — Progress Notes (Signed)

## 2017-08-20 ENCOUNTER — Telehealth: Payer: Self-pay | Admitting: Internal Medicine

## 2017-08-26 ENCOUNTER — Other Ambulatory Visit: Payer: Self-pay | Admitting: Internal Medicine

## 2017-08-26 DIAGNOSIS — E1159 Type 2 diabetes mellitus with other circulatory complications: Secondary | ICD-10-CM

## 2017-08-26 DIAGNOSIS — I10 Essential (primary) hypertension: Secondary | ICD-10-CM

## 2017-08-30 DIAGNOSIS — I38 Endocarditis, valve unspecified: Secondary | ICD-10-CM | POA: Diagnosis not present

## 2017-08-30 DIAGNOSIS — J449 Chronic obstructive pulmonary disease, unspecified: Secondary | ICD-10-CM | POA: Diagnosis not present

## 2017-08-31 ENCOUNTER — Telehealth: Payer: Self-pay

## 2017-08-31 NOTE — Telephone Encounter (Signed)
Needs to speak with a nurse about not feeling well. 

## 2017-08-31 NOTE — Telephone Encounter (Signed)
Pt states she is sure she has pneumonia again, states she has chills, congested, wheezing, wheezes noted while speaking, appt [facility] 9/11, encouraged to go to ED if feels worse or distress, she is agreeable

## 2017-09-01 ENCOUNTER — Ambulatory Visit (INDEPENDENT_AMBULATORY_CARE_PROVIDER_SITE_OTHER): Payer: Medicare Other | Admitting: Internal Medicine

## 2017-09-01 VITALS — BP 189/79 | HR 92 | Temp 98.1°F | Ht 67.0 in | Wt 278.8 lb

## 2017-09-01 DIAGNOSIS — J069 Acute upper respiratory infection, unspecified: Secondary | ICD-10-CM

## 2017-09-01 DIAGNOSIS — E1159 Type 2 diabetes mellitus with other circulatory complications: Secondary | ICD-10-CM | POA: Diagnosis not present

## 2017-09-01 DIAGNOSIS — Z825 Family history of asthma and other chronic lower respiratory diseases: Secondary | ICD-10-CM

## 2017-09-01 DIAGNOSIS — Z7951 Long term (current) use of inhaled steroids: Secondary | ICD-10-CM

## 2017-09-01 DIAGNOSIS — J441 Chronic obstructive pulmonary disease with (acute) exacerbation: Secondary | ICD-10-CM

## 2017-09-01 DIAGNOSIS — Z8249 Family history of ischemic heart disease and other diseases of the circulatory system: Secondary | ICD-10-CM

## 2017-09-01 DIAGNOSIS — I10 Essential (primary) hypertension: Secondary | ICD-10-CM | POA: Diagnosis not present

## 2017-09-01 DIAGNOSIS — Z833 Family history of diabetes mellitus: Secondary | ICD-10-CM | POA: Diagnosis not present

## 2017-09-01 DIAGNOSIS — Z79899 Other long term (current) drug therapy: Secondary | ICD-10-CM | POA: Diagnosis not present

## 2017-09-01 DIAGNOSIS — F17210 Nicotine dependence, cigarettes, uncomplicated: Secondary | ICD-10-CM

## 2017-09-01 DIAGNOSIS — J9610 Chronic respiratory failure, unspecified whether with hypoxia or hypercapnia: Secondary | ICD-10-CM

## 2017-09-01 MED ORDER — AZITHROMYCIN 500 MG PO TABS: 500.0000 mg | ORAL_TABLET | Freq: Every day | ORAL | 0 refills | Status: DC

## 2017-09-01 MED ORDER — PREDNISONE 20 MG PO TABS: 40.0000 mg | ORAL_TABLET | Freq: Every day | ORAL | 0 refills | Status: DC

## 2017-09-01 NOTE — Patient Instructions (Signed)
Thank you for your visit today  Please take the antibiotic daily for 3 days and the prednisone daily for 5 days  Please call the clinic or go to the ER if your symptoms worsen, or if you have worsening cough, feeling short of breath, or fevers.   Please do not smoke  Please follow up on Monday to see how you are doing    Chronic Obstructive Pulmonary Disease Exacerbation Chronic obstructive pulmonary disease (COPD) is a common lung problem. In COPD, the flow of air from the lungs is limited. COPD exacerbations are times that breathing gets worse and you need extra treatment. Without treatment they can be life threatening. If they happen often, your lungs can become more damaged. If your COPD gets worse, your doctor may treat you with:  Medicines.  Oxygen.  Different ways to clear your airway, such as using a mask.  Follow these instructions at home:  Do not smoke.  Avoid tobacco smoke and other things that bother your lungs.  If given, take your antibiotic medicine as told. Finish the medicine even if you start to feel better.  Only take medicines as told by your doctor.  Drink enough fluids to keep your pee (urine) clear or pale yellow (unless your doctor has told you not to).  Use a cool mist machine (vaporizer).  If you use oxygen or a machine that turns liquid medicine into a mist (nebulizer), continue to use them as told.  Keep up with shots (vaccinations) as told by your doctor.  Exercise regularly.  Eat healthy foods.  Keep all doctor visits as told. Get help right away if:  You are very short of breath and it gets worse.  You have trouble talking.  You have bad chest pain.  You have blood in your spit (sputum).  You have a fever.  You keep throwing up (vomiting).  You feel weak, or you pass out (faint).  You feel confused.  You keep getting worse. This information is not intended to replace advice given to you by your health care provider. Make  sure you discuss any questions you have with your health care provider. Document Released: 11/27/2011 Document Revised: 05/15/2016 Document Reviewed: 08/12/2013 Elsevier Interactive Patient Education  2017 [employer].

## 2017-09-01 NOTE — Telephone Encounter (Signed)
agree

## 2017-09-02 ENCOUNTER — Encounter: Payer: Self-pay | Admitting: Dietician

## 2017-09-02 ENCOUNTER — Telehealth: Payer: Self-pay | Admitting: Dietician

## 2017-09-02 NOTE — Telephone Encounter (Signed)
Steroid-Induced Hyperglycemia Prevention and Management Jennifer [name] is a 56 y.o. female who meets criteria for [facility] glucose monitoring program (diabetes patient prescribed short course of steroids).  A/P Current Regimen  Patient prescribed prednisone 80 mg daily x 5 days, currently on day 2 of therapy. Patient taking prednisone in the AM  Prednisone indication: copd exaceration Current DM regimen : Humalog Mix 75/25 130 units before breakfast, 85 units before supper Home BG Monitoring  Patient does have a meter at home and does not check BG at home. Meter was not supplied.  CBGs at home unknown- she cannot find her meter, but will have her granddaughter look for it today   A1C prior to steroid course 8.1%  S/Sx of hyper- or hypoglycemia: polyuria, none  Medication Management  Switch prednisone dose to AM not applicable  Additional treatment for BG control is not indicated at this time.  Physician preference level per protocol: 1  Medication supply Patient will use own medication  Patient Education  Advised patient to monitor BG while on steroid therapy (at least twice daily prior to first 2 meals of the day).  Patient educated about signs/symptoms and advised to contact clinic if hyper- or hypoglycemic.  Patient did  verbalize understanding of information and regimen by repeating back topics discussed.  Follow-up tomorrow am at her appointment here  per Dr. Louie Casa, [name] 1:59 PM 09/02/2017

## 2017-09-02 NOTE — Assessment & Plan Note (Addendum)
BP Readings from Last 3 Encounters:  09/01/17 (!) 189/79  08/06/17 (!) 160/66  06/04/17 (!) 132/53   Patient tells me she only took three of her medications today because she was not feeling well- she does not remember which ones. She was hypertensive today  Plan -advised her to resume medications which are currently prescribed. I have gone over which ones are prescribed.  -RTC on Monday to re-assess

## 2017-09-02 NOTE — Progress Notes (Signed)
CC: viral URI in the setting of chronic COPD respiratory failure leading to COPD exacerbation HPI: [name] is a 56 y.o. woman with PMH noted below here for viral URI in the setting of chronic respiratory failure leading to COPD exacerbation   Please see Problem List/A&P for the status of the patient's chronic medical problems   Past Medical History:  Diagnosis Date  . Anginal pain [facility])    '3' of 10 ischemia ruled out 9/9   . 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 Asthma   . CHF (congestive heart failure) ([place])   . 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 Chronic bronchitis ([place])    "I get it alot" (09/28/2013)  . Chronic diastolic heart failure (HCC)    grade 2 per 2D echocardiogram (01/2013)  . Chronic lower back pain   . Chronic osteomyelitis of foot (HCC)    chronic, right secondary to diabetic foot ulcers  . Chronic pain syndrome 12/03/2011   Likely secondary to depression, "fibromyalgia", neuropathy, and obesity. Lumbar MRI 2014 no sig change from prior (2008) : Stable hypertrophic facet disease most notable at L4-5. Stable shallow left foraminal/extraforaminal disc protrusion at L4-5. No direct neural compression.      Marland Kitchen COPD 01/08/2007   PFT's 05/2007 : FEV1/FVC 82, FEV1 64% pred, FEF 25-75% 40% predicted, 16% improvement in FEV1 with bronchodilators.     . Depression   . Diabetic peripheral neuropathy ([place])   . DVT of upper extremity (deep vein thrombosis) ([place]) 03/11/2013   Secondary to PICC line. Right brachial vein, diagnosed on 03/10/2013 Coumadin for 3 months. End date 06/10/2013   . Environmental allergies    Hx: of  . Exertional shortness of breath   . Fatty liver 2003   observed on ultrasound abdomen  . Fibromyalgia   . GERD (gastroesophageal reflux disease)   . Glaucoma   . Hyperlipidemia   . Hyperplastic colon polyp 12/2010   Per colonoscopy (12/2010) - Dr. Deatra Ina  . Hypertension   . 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 Blood cultures 01/26/13 S. Aureus and GBS. Blood cultures Feb 6th, 8th, and 9th and March were negative.Repeat TEE 3/20 negative for vegitations  . 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 2/2 tobacco use, DM, and cont weight bearing on surgical wound and developed gangrene   . Polymicrobial bacterial infection 01/2013   GBS and S. aureus bacteremia // Source likely infected diabetic foot ulcer  . PVD (peripheral vascular disease) with claudication ([place])    Stents to bilateral common iliac arteries (left 2005, right 2008), on chronic plavix  . Rheumatoid arthritis ([place])   . Tobacco abuse   . Type II diabetes mellitus with peripheral circulatory disorders, uncontrolled DX: 1993   Insulin dep. Poor control. Complicated by diabetic foot ulcer and diabetic eye disease.    Marland Kitchen Ulcer of foot, chronic (HCC)    Left. No OM per MRI (01/2013)    Review of Systems: Denies fevers, but has had subjective chills and some fatigue. Some headaches, cough with increased sputum production without blood  Denies chest pain, orthopnea Some nausea, no vomiting, abd pain, or diarrhea Denies myalgias  Physical Exam: Vitals:   09/01/17 1533  BP: (!) 189/79  Pulse: 92  Temp: 98.1 F (36.7 C)  TempSrc: Oral  SpO2: 99%  Weight: 278 lb  12.8 oz (126.5 kg)  Height: 5\' 7"  (1.702 m)    General: A&O, in NAD,  CV: RRR, normal s1, s2, no m/r/g,  Resp: diffuse wheezing bilaterally . No bronchial breath sounds. No crackles Abdomen: soft, nontender, nondistended, +BS Extremities: 1+ pitting edema b/l  Assessment & Plan:   See encounters tab for problem based medical decision making. Patient discussed with Dr Eppie Gibson

## 2017-09-02 NOTE — Assessment & Plan Note (Addendum)
Patient is here due to cough, increased sputum production of yellow color since the weekend. She denies sick contacts. She continues to smoke. She has chronic COPD, and she uses albuterol, advair, and atrovent, and she has tried increasing all three without amelioration of her symptoms.  On exam today, she was afebrile,  not hypoxic and was 99% on room air. Normal heart rate. She was hypertensive but said that she took only some of her medications this morning. Diffuse wheezing heard bilaterally. I did not hear any bronchial breath sounds.  Oropharyngeal exudates clear. Some bitemporal pressure and maxillary tenderness   Patient has history of chronic COPD leading to chronic respiratory failure with history of chronic opiate therapy, and I believe that her symptoms are suggestive of COPD exacerbation. Overaly, I do think she can safely be treated for her COPD exacerbation as an outpatient with close follow up on Monday. Pneumonia in the differential but less likely currently.  Plan -azithromycin 500 mg for 3 days- I advised pt to obtain the prescriptions today due to the upcoming weather -prednisone 40mg  for 5 days -continue COPD inhalers -Return on Monday to re-assess- If her symptoms do not improve then she will need a CXR- to rule out CAP -asked her to not smoke -Return precautions given

## 2017-09-02 NOTE — Progress Notes (Signed)
[name], female   DOB: 08/18/1961, 56 y.o.   MRN: [id number]  Case discussed with Dr. Tiburcio Pea at the time of the visit.  We reviewed the resident's history and exam and pertinent patient test results.  I agree with the assessment, diagnosis and plan of care documented in the resident's note.

## 2017-09-03 ENCOUNTER — Ambulatory Visit: Payer: Medicare Other | Admitting: Internal Medicine

## 2017-09-03 NOTE — Telephone Encounter (Signed)
Tried to call pt, no answer, no vmail, script in file

## 2017-09-03 NOTE — Telephone Encounter (Signed)
Steroid-Induced Hyperglycemia Prevention and Management Follow up Jennifer [name] is a 56 y.o. female who meets criteria for [facility] glucose monitoring program (diabetes patient prescribed short course of steroids).  A/P [place] BG Monitoring  CBGs at home 389 last night and "High" (>500) today at 1:42 PM  S/Sx of hyper- or hypoglycemia: fatigue and polyuria, none  Medication Management  Additional treatment for BG control is indicated at this time.  Increase morning dose of Humalog Mix 75/25 by 10% (20 units) insulin x 3-4 days  Patient Education  Advised patient to monitor BG while on steroid therapy (at least twice daily prior to first 2 meals of the day).  Patient educated about drinking plenty of water and other non caloric beverages to stay hydrated and call the resident on call as needed.   Patient did verbalize understanding of information and regimen by repeating back topics discussed.  Follow-up monday  09-24-17 with Dr. Louie Casa, [name] 1:49 PM 09/03/2017

## 2017-09-07 NOTE — Telephone Encounter (Signed)
Feeling some better. blood sugars a little high. Thinks her last steroid dose was Sunday. CBG was 433 yesterday afternoon. has not taken her am insulin yet. She reports taking 150 units insulin qam & 85 units qpm over the weekend.  Checked blood sugar now and it was- 459 has already eaten egg toast, potatoes in early am and , sandwich 1 hour ago, small cup regular orange soda.    Plan: Take 150 units now, check blood sugar 1-2 times tonight, and fasting tomorrow. CDE to call tomorrow since blood sugars are still high. Patient verbalized understanding to plan. Grettell Ransdell, [name], [place] 09/07/2017 2:01 PM.

## 2017-09-11 NOTE — Telephone Encounter (Signed)
Called to follow up on her blood sugars. She says they are back to normal and she has gone back to her usual insulin doses. She had no hypoglycemia. She still feels bad.  P: she is looking forward to seeing Dr. [name] on 09-24-17. Marland Kitchen

## 2017-09-14 ENCOUNTER — Ambulatory Visit: Payer: Medicare Other | Admitting: Podiatry

## 2017-09-24 ENCOUNTER — Encounter: Payer: Self-pay | Admitting: Internal Medicine

## 2017-09-24 ENCOUNTER — Ambulatory Visit (INDEPENDENT_AMBULATORY_CARE_PROVIDER_SITE_OTHER): Payer: Medicare Other | Admitting: Internal Medicine

## 2017-09-24 ENCOUNTER — Ambulatory Visit (HOSPITAL_COMMUNITY)
Admission: RE | Admit: 2017-09-24 | Discharge: 2017-09-24 | Disposition: A | Discharge status: Home / Self Care | Payer: Medicare Other | Source: Ambulatory Visit | Attending: Internal Medicine | Admitting: Internal Medicine

## 2017-09-24 VITALS — BP 200/76 | HR 67 | Temp 97.9°F | Wt 284.3 lb

## 2017-09-24 DIAGNOSIS — Z79891 Long term (current) use of opiate analgesic: Secondary | ICD-10-CM | POA: Diagnosis not present

## 2017-09-24 DIAGNOSIS — I10 Essential (primary) hypertension: Secondary | ICD-10-CM | POA: Diagnosis not present

## 2017-09-24 DIAGNOSIS — I517 Cardiomegaly: Secondary | ICD-10-CM | POA: Insufficient documentation

## 2017-09-24 DIAGNOSIS — F332 Major depressive disorder, recurrent severe without psychotic features: Secondary | ICD-10-CM | POA: Diagnosis not present

## 2017-09-24 DIAGNOSIS — J42 Unspecified chronic bronchitis: Secondary | ICD-10-CM | POA: Diagnosis not present

## 2017-09-24 DIAGNOSIS — F1120 Opioid dependence, uncomplicated: Secondary | ICD-10-CM

## 2017-09-24 DIAGNOSIS — E1159 Type 2 diabetes mellitus with other circulatory complications: Secondary | ICD-10-CM | POA: Diagnosis not present

## 2017-09-24 DIAGNOSIS — Z23 Encounter for immunization: Secondary | ICD-10-CM | POA: Diagnosis not present

## 2017-09-24 DIAGNOSIS — I152 Hypertension secondary to endocrine disorders: Secondary | ICD-10-CM

## 2017-09-24 DIAGNOSIS — R600 Localized edema: Secondary | ICD-10-CM | POA: Diagnosis not present

## 2017-09-24 DIAGNOSIS — R059 Cough, unspecified: Secondary | ICD-10-CM

## 2017-09-24 DIAGNOSIS — R05 Cough: Secondary | ICD-10-CM

## 2017-09-24 MED ORDER — OXYCODONE-ACETAMINOPHEN 5-325 MG PO TABS: 1.0000 | ORAL_TABLET | ORAL | 0 refills | Status: DC | PRN

## 2017-09-24 MED ORDER — BUPROPION HCL ER (XL) 300 MG PO TB24: 300.0000 mg | ORAL_TABLET | Freq: Every day | ORAL | 2 refills | Status: DC

## 2017-09-24 NOTE — Patient Instructions (Addendum)
1. I gave you 3 paper copy of your pain medicine 2. I am ordering a chest Xray 3. I am ordering a scan of your right leg 4. See me in one month 5. I am increasing your wellbutrin to 300 mg

## 2017-09-24 NOTE — Assessment & Plan Note (Signed)
This problem is chronic and uncontrolled. Her chronic pain is multifactorial including status post left BKA, depression, neuropathy, obesity, and fibromyalgia. Since I started her taper in August 2016, I have been able to decrease her from 1800 mg of oxycodone to 1110 mg of oxycodone, for a 38% decrease in her oxycodone dose. She gets oxycodone 5 mg/325 mg APAP and receives 222 pills. This means she can take 7.4 pills a day. I have halted her taper as of a few months ago due to increased psychosocial economical distress. I have never had any concerns about her misusing the medication or diverting the medication. My biggest concern is that she is not getting the benefit from a medication that we intend. It is also not allowing her to increase her daily activity. She states she is no longer taking Benadryl asleep today away because now, Benadryl is not making her tired. She has a lot of somatic complaints today but the one I am working up is continued right lower extremity edema because she also complains of muscle spasms in her right calf and thigh. I have a very low pretest probability for a DVT but am ordering a lower extremity Doppler. In retrospect, a d-dimer would've been appropriate due to the low pretest probability even though she likely has a high rate of false positive due to her recent COPD exacerbation.  She was also complaining of right posterior calf and thigh muscle spasm. She has no known PAD but continues to smoke so she is at high risk of developing PAD. Her last ABIs were completed in 2016 and her right ABI was 1.6. I had sent her back to her vascular surgeon the summer but she was not able to pay the co-pay.  PLAN : Right lower extremity Doppler Halt opioid taper and continue oxycodone 5/325 APAP 222 pills per month

## 2017-09-24 NOTE — Assessment & Plan Note (Signed)
This problem is chronic and stable. On September 11, she was seen in [facility] for a COPD exacerbation and was prescribed 3 days of azithromycin and 5 days of prednisone. Today, she states she has not gotten any better and has had a cough for one month. On examination, her lungs are clear. Air movement. I do not think that she continues to have COPD exacerbation. However due to her tobacco use and 1 month of cough, I will get a chest x-ray.  I personally viewed the CXR images and confirmed my reading with the official read. AP and lateral. Decent quality. There is a small segment of the left heart border which is obscured but otherwise there are no abnormalities.  PLAN : cont COPD meds

## 2017-09-24 NOTE — Assessment & Plan Note (Signed)
This problem is chronic and uncontrolled. Her medication regimen is amlodipine 10 mg, atenolol 100 mg, benazepril 40 mg, hydrochlorothiazide 25 mg, Lasix when necessary but I have significant concerns about her compliance. She explains her significantly elevated blood pressure today due to personal stressors. In any other patient, I would about her medications, check creatinine, check cardiac function, or considered admission. But with Jennifer [name], I have never been able to make any will not progress in regards to her chronic medical problems including diabetes, hypertension, smoking, depression... I will get her in sooner in [facility] to recheck her blood pressure during and afternoon when I am attending.  PLAN:  Cont current meds ACC 2 weeks BP check   BP Readings from Last 3 Encounters:  09/24/17 (!) 200/76  09/01/17 (!) 189/79  08/06/17 (!) 160/66

## 2017-09-24 NOTE — Progress Notes (Signed)
   Subjective:    [name], female    DOB: 25-Aug-1961, 56 y.o.   MRN: [id number]  HPI  Jennifer [name] is here for depression F/U. Please see the A&P for the status of the pt's chronic medical problems.  ROS : per ROS section and in problem oriented charting. All other systems are negative.  PMHx, Soc hx, and / or Fam hx :  Has hearing 10th regarding her eviction.   Review of Systems  Constitutional: Positive for fatigue.  HENT: Positive for rhinorrhea, sinus pressure and sneezing.   Eyes: Positive for itching.  Respiratory: Positive for cough, shortness of breath and wheezing.   Gastrointestinal: Positive for abdominal pain and diarrhea. Negative for nausea.  Endocrine: Positive for polyuria.  Genitourinary: Positive for frequency. Negative for dysuria.       + INCONTINENCE  Musculoskeletal: Positive for arthralgias, back pain, gait problem and myalgias.  Skin: Positive for wound.  Neurological: Positive for headaches.  Psychiatric/Behavioral: Positive for sleep disturbance.       Objective:   Physical Exam  Constitutional: She appears well-developed and well-nourished. No distress.  HENT:  Head: Normocephalic and atraumatic.  Right Ear: External ear normal.  Left Ear: External ear normal.  Nose: Nose normal.  Eyes: Conjunctivae are normal. Right eye exhibits no discharge. Left eye exhibits no discharge. No scleral icterus.  Cardiovascular: Normal rate and normal heart sounds.   Pulmonary/Chest: Effort normal and breath sounds normal. No respiratory distress. She has no wheezes.  Abdominal: Soft. Bowel sounds are normal.  Musculoskeletal: She exhibits edema. She exhibits no tenderness.  Neurological: She is alert.  Skin: Skin is warm and dry. She is not diaphoretic. There is erythema.  Psychiatric: She has a normal mood and affect. Her behavior is normal. Judgment and thought content normal.      Assessment & Plan:

## 2017-09-24 NOTE — Assessment & Plan Note (Signed)
This problem is chronic and uncontrolled. At her last appointment. I added Wellbutrin 150 to her Zoloft. Today she states she has no side effects to this medication but no change in her symptoms. She has never followed through with psychiatry consult due to the co-pay. She was in agreement increase the Wellbutrin to 300 mg a day and I sent in the prescription.  PLAN : Continue Zoloft Increase Wellbutrin to 300

## 2017-10-01 ENCOUNTER — Other Ambulatory Visit: Payer: Self-pay | Admitting: Student in an Organized Health Care Education/Training Program

## 2017-10-01 DIAGNOSIS — I10 Essential (primary) hypertension: Principal | ICD-10-CM

## 2017-10-01 DIAGNOSIS — E1159 Type 2 diabetes mellitus with other circulatory complications: Secondary | ICD-10-CM

## 2017-10-22 ENCOUNTER — Other Ambulatory Visit: Payer: Self-pay | Admitting: Internal Medicine

## 2017-10-22 DIAGNOSIS — I10 Essential (primary) hypertension: Principal | ICD-10-CM

## 2017-10-22 DIAGNOSIS — E1159 Type 2 diabetes mellitus with other circulatory complications: Secondary | ICD-10-CM

## 2017-10-28 ENCOUNTER — Encounter: Payer: Self-pay | Admitting: *Deleted

## 2017-10-29 ENCOUNTER — Other Ambulatory Visit: Payer: Self-pay | Admitting: Student in an Organized Health Care Education/Training Program

## 2017-11-16 ENCOUNTER — Telehealth: Payer: Self-pay | Admitting: *Deleted

## 2017-11-16 NOTE — Telephone Encounter (Signed)
Called pt - no answer; left message to give me a call back. 

## 2017-11-16 NOTE — Telephone Encounter (Signed)
Geriatric Task Force Called pt - states she's not feeling well; coughing x 2 weeks. Trying to find transportation so she can scheduled an appt. States she has been using her inhaler but nebulizer is in storage, Asked about her blood sugars - states they have been "running high"; last taken 3-4 days ago when asked States she's taking all her meds including diabetes and for HTN. She does not have a BP monitor to use at home but plans to get one. Also states she does not have any problems getting her meds from the pharmacy. Upon ending call, she assured me she will call/find transportation and will call back. Next appt w/Dr [name] is 12/24/17.

## 2017-11-16 NOTE — Telephone Encounter (Signed)
Please call pt back.

## 2017-11-16 NOTE — Telephone Encounter (Signed)
Agree she needs appt. Thanks

## 2017-11-25 NOTE — Telephone Encounter (Signed)
error 

## 2017-11-26 ENCOUNTER — Other Ambulatory Visit: Payer: Self-pay | Admitting: Internal Medicine

## 2017-11-27 NOTE — Telephone Encounter (Signed)
Called to PPA 

## 2017-12-09 ENCOUNTER — Encounter: Payer: Self-pay | Admitting: *Deleted

## 2017-12-10 ENCOUNTER — Encounter: Payer: Self-pay | Admitting: *Deleted

## 2017-12-10 ENCOUNTER — Ambulatory Visit: Payer: Medicare Other | Admitting: Internal Medicine

## 2017-12-10 ENCOUNTER — Other Ambulatory Visit: Payer: Self-pay

## 2017-12-10 ENCOUNTER — Other Ambulatory Visit: Payer: Self-pay | Admitting: *Deleted

## 2017-12-10 NOTE — Patient Outreach (Signed)
New referral: Referral reassigned from case manager Tish Men.  Placed call to patient who reports that she is doing well except she needs help with housing. Reports that she was evicted and lost her section 8. Reports she moved into [facility].  States that she will be homeless in January due to hotel will no longer let her stay. Reports that her 2 sons are living with her in the hotel.  Patient reports that her DM is going well, states she has all her medications at this time. Has follow up planned with primary MD in January.  PLAN: patient denies any nursing issues at this time. Identifies her concern and goal at this time is housing. Placed call to [facility] social worker Deitra Mayo and discussed case. Social worker referral placed. Patient will be closed to nursing.   Tomasa Rand, RN, BSN, CEN [facility] [employer] [phone]

## 2017-12-10 NOTE — Patient Outreach (Signed)
[facility]) Care Management  12/10/2017  ALIVEA GLADSON 07/26/1961 [id number]   CSW made an initial attempt to try and contact patient today to perform phone assessment, as well as assess and assist with social needs and services, without success.  A HIPAA compliant message was left for patient on voicemail.  CSW is currently awaiting a return call. CSW will make a second outreach attempt within the next week, if CSW does not receive a return call from patient in the meantime. Nat Christen, BSW, MSW, LCSW  Licensed Education officer, [facility]  M[address] [place] [address] [place], [place] [address] Physical Address-[address], [place], [place] [address] Toll Free Main # [phone] Fax # [phone] Cell # [phone]  Office # [phone] [email or web address]

## 2017-12-17 ENCOUNTER — Other Ambulatory Visit: Payer: Self-pay | Admitting: Internal Medicine

## 2017-12-17 ENCOUNTER — Other Ambulatory Visit: Payer: Self-pay | Admitting: *Deleted

## 2017-12-17 ENCOUNTER — Encounter: Payer: Self-pay | Admitting: *Deleted

## 2017-12-17 ENCOUNTER — Other Ambulatory Visit: Payer: Self-pay

## 2017-12-17 DIAGNOSIS — F1120 Opioid dependence, uncomplicated: Secondary | ICD-10-CM

## 2017-12-17 MED ORDER — OXYCODONE-ACETAMINOPHEN 5-325 MG PO TABS: 1.0000 | ORAL_TABLET | ORAL | 0 refills | Status: DC | PRN

## 2017-12-17 NOTE — Patient Outreach (Signed)
Request received from Joanna Saporito, LCSW to mail patient personal care resources.  Information mailed today. 

## 2017-12-17 NOTE — Patient Outreach (Signed)
[facility]) Care Management  12/17/2017  Jennifer [name] January 05, 1961 [id number]  CSW was able to make initial contact with patient today to perform phone assessment, as well as assess and assist with social work needs and services.  CSW introduced self, explained role and types of services provided through [facility] ([facility]).  Patient remembers having worked with [facility] in the past, on several different occasions.  CSW further explained to patient that CSW works with patient's RNCM, also with [facility], Tomasa Rand. CSW then explained the reason for the call, indicating that [name] thought that patient would benefit from social work services and resources to assist with making alternate housing arrangements.  CSW obtained two HIPAA compliant identifiers from patient, which included patient's name and date of birth. Patient admitted to [facility] that she is scheduled to be evicted from the extended stay motel where she is currently residing ([address], [address], [place], [place] [address]) on December 29, 2017.  Patient admits that she has no where to go and no money to put down for a rent deposit.  Patient went on to say that she lost her Section 8 Housing, no longer eligible to receive financial assistance through the [employer].  CSW agreed to mail patient a list of [place], as well as [facility] health practitioner.  CSW then agreed to follow-up with patient next week to ensure that she received the packet of resource mailed to her home by CSW.  CSW will be prepared to answer any questions that patient may have at that time, as well as make possible referrals, if necessary.  Patient voiced understanding and was agreeable to this plan. [facility] CM Care Plan Problem One     Most Recent Value  Care Plan Problem One  Lack of housing resources.  Role Documenting the Problem One  Clinical Social Worker  Care  Plan for Problem One  Active  [facility] Long Term Goal   Patient will have secure housing in place within the next 45 days.  [facility]N Long Term Goal Start Date  12/17/17  Interventions for Problem One Long Term Goal  CSW will assist patient with finding alternate housing arrangements.  [facility] CM Short Term Goal #1   Patient will review the list of housing resources provided to her by CSW, within the next two weeks.  [facility] CM Short Term Goal #1 Start Date  12/17/17  Interventions for Short Term Goal #1  CSW will mail patient a list of housing resources.  [facility]N CM Short Term Goal #2   Patient will call and tour housing resources of choice, within the next two weeks.  [facility] CM Short Term Goal #2 Start Date  12/17/17  Interventions for Short Term Goal #2  CSW will assist patient with the referral process for low-income housing options.     Nat Christen, BSW, MSW, LCSW  Licensed Education officer, environmental Health System  Mailing [place] [address], [place], [place] [address] Physical Address-[address], [place], [place] [address] Toll Free Main # [phone] Fax # [phone] Cell # [phone]  Office # [phone] [name]

## 2017-12-17 NOTE — Telephone Encounter (Signed)
oxyCODONE-acetaminophen (PERCOCET/ROXICET) 5-325 MG tablet, REFILL REQUEST 

## 2017-12-18 ENCOUNTER — Other Ambulatory Visit: Payer: Self-pay

## 2017-12-18 ENCOUNTER — Telehealth: Payer: Self-pay | Admitting: *Deleted

## 2017-12-18 ENCOUNTER — Ambulatory Visit (INDEPENDENT_AMBULATORY_CARE_PROVIDER_SITE_OTHER): Payer: Medicare Other | Admitting: Internal Medicine

## 2017-12-18 VITALS — BP 187/84 | HR 72 | Temp 97.9°F | Ht 67.0 in | Wt 281.3 lb

## 2017-12-18 DIAGNOSIS — B372 Candidiasis of skin and nail: Secondary | ICD-10-CM | POA: Diagnosis not present

## 2017-12-18 DIAGNOSIS — Z79891 Long term (current) use of opiate analgesic: Secondary | ICD-10-CM | POA: Diagnosis not present

## 2017-12-18 DIAGNOSIS — F1120 Opioid dependence, uncomplicated: Secondary | ICD-10-CM

## 2017-12-18 MED ORDER — KETOCONAZOLE 2 % EX CREA: 1.0000 "application " | TOPICAL_CREAM | Freq: Every day | CUTANEOUS | 0 refills | Status: AC

## 2017-12-18 MED ORDER — OXYCODONE-ACETAMINOPHEN 5-325 MG PO TABS: 1.0000 | ORAL_TABLET | ORAL | 0 refills | Status: DC | PRN

## 2017-12-18 NOTE — Patient Instructions (Signed)
Jennifer [name],  I am going to send in a new prescription for an antifungal cream called Ketoconazole. Apply this once a day for the next 2 weeks.  Follow up with Dr. [name] next month.

## 2017-12-18 NOTE — Assessment & Plan Note (Signed)
Will treat with daily ketoconazole cream x 2 weeks. Provided instructions on keeping the areas clean and dry.

## 2017-12-18 NOTE — Telephone Encounter (Signed)
-----   Message from Bartholomew Crews, MD sent at 12/17/2017  7:51 PM EST ----- I tried to do electronic script but [place] would not accept electronic controlled substance script. Would you pls ask her for a different pharmacy or ask Dr Rebeca Alert to print?

## 2017-12-18 NOTE — Telephone Encounter (Signed)
Seen today and sent out with paper script.

## 2017-12-18 NOTE — Telephone Encounter (Signed)
thanks

## 2017-12-18 NOTE — Telephone Encounter (Signed)
Called pt -no answer/left message; but according to Epic, she has an appt this am in [facility].

## 2017-12-18 NOTE — Progress Notes (Signed)
CC: Rash  HPI:  [name] is a 56 y.o. female with a past medical history listed below here today with complaints of rash. She reports that she has had a yeast infection underneath her pannus and inguinal folds for the past year. She reports using a cream previously and most recently nystatin powder without benefit. She reports that the lesions are painful and itching but what is bothering her the most is the smell. She reports using the powder 4-5 times a day. She also notes trying to keep the areas dry especially after bathing. Denies any surrounding erythema or warmth. No fevers or chills.   Past Medical History:  Diagnosis Date  . Anginal pain [facility])    '3' of 10 ischemia ruled out 9/9   . Arthritis of lumbar spine   . Asthma   . CHF (congestive heart failure) ([place])   . 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 Chronic bronchitis ([place])    "I get it alot" (09/28/2013)  . Chronic diastolic heart failure (HCC)    grade 2 per 2D echocardiogram (01/2013)  . Chronic lower back pain   . Chronic osteomyelitis of foot (HCC)    chronic, right secondary to diabetic foot ulcers  . Chronic pain syndrome 12/03/2011   Likely secondary to depression, "fibromyalgia", neuropathy, and obesity. Lumbar MRI 2014 no sig change from prior (2008) : Stable hypertrophic facet disease most notable at L4-5. Stable shallow left foraminal/extraforaminal disc protrusion at L4-5. No direct neural compression.      Marland Kitchen COPD 01/08/2007   PFT's 05/2007 : FEV1/FVC 82, FEV1 64% pred, FEF 25-75% 40% predicted, 16% improvement in FEV1 with bronchodilators.     . Depression   . Diabetic peripheral neuropathy ([place])   . DVT of upper extremity (deep vein thrombosis) ([place]) 03/11/2013   Secondary to PICC line. Right brachial vein, diagnosed on 03/10/2013 Coumadin for 3 months. End date 06/10/2013   . Environmental allergies    Hx: of  . Exertional shortness of breath   . Fatty liver 2003   observed on  ultrasound abdomen  . Fibromyalgia   . GERD (gastroesophageal reflux disease)   . Glaucoma   . Hyperlipidemia   . Hyperplastic colon polyp 12/2010   Per colonoscopy (12/2010) - Dr. Deatra Ina  . Hypertension   . 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 Blood cultures 01/26/13 S. Aureus and GBS. Blood cultures Feb 6th, 8th, and 9th and March were negative.Repeat TEE 3/20 negative for vegitations  . 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 2/2 tobacco use, DM, and cont weight bearing on surgical wound and developed gangrene   . Polymicrobial bacterial infection 01/2013   GBS and S. aureus bacteremia // Source likely infected diabetic foot ulcer  . PVD (peripheral vascular disease) with claudication ([place])    Stents to bilateral common iliac arteries (left 2005, right 2008), on chronic plavix  . Rheumatoid arthritis ([place])   . Tobacco abuse   . Type II diabetes mellitus with peripheral circulatory disorders, uncontrolled DX: 1993   Insulin dep. Poor control. Complicated by diabetic foot ulcer and diabetic eye disease.    Marland Kitchen Ulcer of foot, chronic (HCC)    Left. No OM per MRI (01/2013)   Review of Systems:   Negative except as noted in HPI  Physical Exam:  Vitals:   12/18/17 1043  BP: (!) 187/84  Pulse: 72  Temp: 97.9 F (36.6 C)  TempSrc: Oral  SpO2: 98%  Weight: 281 lb 4.8 oz (127.6 kg)  Height: 5\' 7"  (1.702 m)   GENERAL- alert, co-operative, appears as stated age, not in any distress. CARDIAC- RRR, no murmurs, rubs or gallops. RESP- Moving equal volumes of air, and clear to auscultation bilaterally, no wheezes or crackles. ABDOMEN- Soft, nontender, bowel sounds present. NEURO- No obvious Cr N abnormality. EXTREMITIES- pulse 2+, symmetric, no pedal edema. SKIN- Discrete large Intertrigo lesions in abdominal pannus as well as in inguinal folds PSYCH- Normal  mood and affect, appropriate thought content and speech.   Assessment & Plan:   See Encounters Tab for problem based charting.  Patient discussed with Dr. Rebeca Alert

## 2017-12-23 NOTE — Assessment & Plan Note (Signed)
Prescription was attempted to be sent to her pharmacy electronically 12/27 by Dr. [name] but unable to go through (see telephone note).   Will provide script for Oxycodone-acetaminophen 5-325 mg #222 x 1 script today. Has follow up with Dr. [name] in January.

## 2017-12-24 ENCOUNTER — Ambulatory Visit: Payer: Medicare Other | Admitting: Internal Medicine

## 2017-12-24 NOTE — Progress Notes (Signed)
Internal Medicine Clinic Attending  Case discussed with Dr. Boswell  at the time of the visit.  We reviewed the resident's history and exam and pertinent patient test results.  I agree with the assessment, diagnosis, and plan of care documented in the resident's note.  Alexandrya Chim N Odean Mcelwain, MD   

## 2017-12-25 ENCOUNTER — Other Ambulatory Visit: Payer: Self-pay | Admitting: *Deleted

## 2017-12-25 ENCOUNTER — Encounter: Payer: Self-pay | Admitting: *Deleted

## 2017-12-25 NOTE — Patient Outreach (Signed)
[facility]) Care Management  12/25/2017  Jennifer [name] [birth year] [id number]   Return call received from patient, in reference to the HIPAA compliant message left for patient earlier in the day, confirming that she received the packet of resource information mailed to her home by CSW.  Patient reported that the information will be helpful in helping her find more permanent residence.  Patient is still scheduled to be evicted on Tuesday, December 29, 2017, at which time, patient plans to go to another extended stay motel.  Patient reported, "I will work it out, I always do".  Patient was most appreciative of the housing resources and low income housing options provided to her.   CSW will perform a case closure on patient, as all goals of treatment have been met from social work standpoint and no additional social work needs have been identified at this time.  CSW will fax an update to patient's Primary Care Physician, Dr. Larey Dresser to ensure that they are aware of CSW's involvement with patient's plan of care.  CSW will submit a case closure request to Alycia Rossetti, Care Management Assistant with [facility], in the form of an [employer].   [facility] CM Care Plan Problem One     Most Recent Value  Care Plan Problem One  Lack of housing resources.  Role Documenting the Problem One  Clinical Social Worker  Care Plan for Problem One  Active  [facility] Long Term Goal   Patient will have secure housing in place within the next 45 days.  [facility] Long Term Goal Start Date  12/17/17  [facility] Long Term Goal Met Date  12/25/17  Interventions for Problem One Long Term Goal  CSW will assist patient with finding alternate housing arrangements.  [facility] CM Short Term Goal #1   Patient will review the list of housing resources provided to her by CSW, within the next two weeks.  [facility] CM Short Term Goal #1 Start Date  12/17/17  [facility] CM Short Term Goal #1 Met Date  12/25/17   Interventions for Short Term Goal #1  CSW will mail patient a list of housing resources.  [facility] CM Short Term Goal #2   Patient will call and tour housing resources of choice, within the next two weeks.  [facility] CM Short Term Goal #2 Start Date  12/17/17  [facility] CM Short Term Goal #2 Met Date  12/25/17  Interventions for Short Term Goal #2  CSW will assist patient with the referral process for low-income housing options.     Nat Christen, BSW, MSW, LCSW  Licensed Education officer, environmental Health System  Mailing [place] [address], [place], [place] [address] Physical Address-[address], [place],  [address] Toll Free Main # [phone] Fax # [phone] Cell # [phone]  Office # [phone] [email or web address]

## 2017-12-25 NOTE — Patient Outreach (Signed)
[facility]) Care Management  12/25/2017  MERSADIE KAVANAUGH 1961-07-23 [id number]   CSW made an attempt to try and contact patient today to follow-up regarding social work services and resources, as well as to ensure that patient received the packet of resource information mailed to her home; however, patient was unavailable at the time of CSW's call.  A HIPAA compliant message was left for patient on voicemail and CSW is currently awaiting a return call.  CSW will make a second outreach attempt within the next week, if a return call is not received from patient in the meantime. Nat Christen, BSW, MSW, LCSW  Licensed Education officer, environmental Health System  Mailing [place] [address] [place], [place] [address] Physical Address-[address], [place],  [address] Toll Free Main # [phone] Fax # [phone] Cell # [phone]  Office # [phone] [email or web address]

## 2017-12-29 ENCOUNTER — Ambulatory Visit: Payer: Self-pay | Admitting: *Deleted

## 2018-01-01 ENCOUNTER — Other Ambulatory Visit: Payer: Self-pay | Admitting: Internal Medicine

## 2018-01-21 ENCOUNTER — Other Ambulatory Visit: Payer: Self-pay

## 2018-01-21 ENCOUNTER — Ambulatory Visit (INDEPENDENT_AMBULATORY_CARE_PROVIDER_SITE_OTHER): Payer: Medicare Other | Admitting: Internal Medicine

## 2018-01-21 ENCOUNTER — Encounter: Payer: Self-pay | Admitting: Internal Medicine

## 2018-01-21 ENCOUNTER — Observation Stay (HOSPITAL_COMMUNITY)
Admission: AD | Admit: 2018-01-21 | Discharge: 2018-01-23 | Disposition: A | Discharge status: Home / Self Care | Payer: Medicare Other | Source: Ambulatory Visit | Attending: Internal Medicine | Admitting: Internal Medicine

## 2018-01-21 VITALS — BP 159/62 | HR 89 | Temp 98.1°F | Ht 67.0 in

## 2018-01-21 DIAGNOSIS — F341 Dysthymic disorder: Principal | ICD-10-CM | POA: Insufficient documentation

## 2018-01-21 DIAGNOSIS — I5032 Chronic diastolic (congestive) heart failure: Secondary | ICD-10-CM | POA: Diagnosis not present

## 2018-01-21 DIAGNOSIS — L299 Pruritus, unspecified: Secondary | ICD-10-CM

## 2018-01-21 DIAGNOSIS — F332 Major depressive disorder, recurrent severe without psychotic features: Secondary | ICD-10-CM | POA: Diagnosis not present

## 2018-01-21 DIAGNOSIS — Z86718 Personal history of other venous thrombosis and embolism: Secondary | ICD-10-CM | POA: Insufficient documentation

## 2018-01-21 DIAGNOSIS — R0602 Shortness of breath: Secondary | ICD-10-CM

## 2018-01-21 DIAGNOSIS — B372 Candidiasis of skin and nail: Secondary | ICD-10-CM | POA: Diagnosis not present

## 2018-01-21 DIAGNOSIS — R509 Fever, unspecified: Secondary | ICD-10-CM | POA: Diagnosis not present

## 2018-01-21 DIAGNOSIS — Z79899 Other long term (current) drug therapy: Secondary | ICD-10-CM

## 2018-01-21 DIAGNOSIS — R109 Unspecified abdominal pain: Secondary | ICD-10-CM

## 2018-01-21 DIAGNOSIS — K219 Gastro-esophageal reflux disease without esophagitis: Secondary | ICD-10-CM | POA: Diagnosis not present

## 2018-01-21 DIAGNOSIS — Z888 Allergy status to other drugs, medicaments and biological substances status: Secondary | ICD-10-CM | POA: Diagnosis not present

## 2018-01-21 DIAGNOSIS — R45851 Suicidal ideations: Secondary | ICD-10-CM | POA: Diagnosis not present

## 2018-01-21 DIAGNOSIS — M549 Dorsalgia, unspecified: Secondary | ICD-10-CM | POA: Diagnosis not present

## 2018-01-21 DIAGNOSIS — R358 Other polyuria: Secondary | ICD-10-CM

## 2018-01-21 DIAGNOSIS — G894 Chronic pain syndrome: Secondary | ICD-10-CM | POA: Insufficient documentation

## 2018-01-21 DIAGNOSIS — R05 Cough: Secondary | ICD-10-CM | POA: Diagnosis not present

## 2018-01-21 DIAGNOSIS — E1142 Type 2 diabetes mellitus with diabetic polyneuropathy: Secondary | ICD-10-CM | POA: Diagnosis not present

## 2018-01-21 DIAGNOSIS — Z590 Homelessness: Secondary | ICD-10-CM | POA: Insufficient documentation

## 2018-01-21 DIAGNOSIS — M069 Rheumatoid arthritis, unspecified: Secondary | ICD-10-CM | POA: Insufficient documentation

## 2018-01-21 DIAGNOSIS — Z6841 Body Mass Index (BMI) 40.0 and over, adult: Secondary | ICD-10-CM

## 2018-01-21 DIAGNOSIS — E1151 Type 2 diabetes mellitus with diabetic peripheral angiopathy without gangrene: Secondary | ICD-10-CM

## 2018-01-21 DIAGNOSIS — Z6379 Other stressful life events affecting family and household: Secondary | ICD-10-CM

## 2018-01-21 DIAGNOSIS — E785 Hyperlipidemia, unspecified: Secondary | ICD-10-CM | POA: Diagnosis not present

## 2018-01-21 DIAGNOSIS — R6889 Other general symptoms and signs: Secondary | ICD-10-CM

## 2018-01-21 DIAGNOSIS — Z794 Long term (current) use of insulin: Secondary | ICD-10-CM | POA: Insufficient documentation

## 2018-01-21 DIAGNOSIS — E1165 Type 2 diabetes mellitus with hyperglycemia: Secondary | ICD-10-CM | POA: Insufficient documentation

## 2018-01-21 DIAGNOSIS — R159 Full incontinence of feces: Secondary | ICD-10-CM

## 2018-01-21 DIAGNOSIS — J101 Influenza due to other identified influenza virus with other respiratory manifestations: Secondary | ICD-10-CM | POA: Insufficient documentation

## 2018-01-21 DIAGNOSIS — E119 Type 2 diabetes mellitus without complications: Secondary | ICD-10-CM

## 2018-01-21 DIAGNOSIS — J449 Chronic obstructive pulmonary disease, unspecified: Secondary | ICD-10-CM | POA: Insufficient documentation

## 2018-01-21 DIAGNOSIS — R197 Diarrhea, unspecified: Secondary | ICD-10-CM | POA: Diagnosis not present

## 2018-01-21 DIAGNOSIS — E1139 Type 2 diabetes mellitus with other diabetic ophthalmic complication: Secondary | ICD-10-CM | POA: Diagnosis not present

## 2018-01-21 DIAGNOSIS — IMO0002 Reserved for concepts with insufficient information to code with codable children: Secondary | ICD-10-CM

## 2018-01-21 DIAGNOSIS — E669 Obesity, unspecified: Secondary | ICD-10-CM

## 2018-01-21 DIAGNOSIS — R51 Headache: Secondary | ICD-10-CM

## 2018-01-21 DIAGNOSIS — F17210 Nicotine dependence, cigarettes, uncomplicated: Secondary | ICD-10-CM | POA: Insufficient documentation

## 2018-01-21 DIAGNOSIS — L739 Follicular disorder, unspecified: Secondary | ICD-10-CM | POA: Insufficient documentation

## 2018-01-21 DIAGNOSIS — Z885 Allergy status to narcotic agent status: Secondary | ICD-10-CM | POA: Insufficient documentation

## 2018-01-21 DIAGNOSIS — M797 Fibromyalgia: Secondary | ICD-10-CM | POA: Insufficient documentation

## 2018-01-21 DIAGNOSIS — I110 Hypertensive heart disease with heart failure: Secondary | ICD-10-CM | POA: Insufficient documentation

## 2018-01-21 DIAGNOSIS — M7989 Other specified soft tissue disorders: Secondary | ICD-10-CM

## 2018-01-21 DIAGNOSIS — R32 Unspecified urinary incontinence: Secondary | ICD-10-CM

## 2018-01-21 DIAGNOSIS — R350 Frequency of micturition: Secondary | ICD-10-CM

## 2018-01-21 LAB — BASIC METABOLIC PANEL
Anion gap: 11 (ref 5–15)
BUN: 9 mg/dL (ref 6–20)
CO2: 27 mmol/L (ref 22–32)
Calcium: 8.5 mg/dL — ABNORMAL LOW (ref 8.9–10.3)
Chloride: 98 mmol/L — ABNORMAL LOW (ref 101–111)
Creatinine, Ser: 1.03 mg/dL — ABNORMAL HIGH (ref 0.44–1.00)
GFR calc Af Amer: >60 mL/min (ref >60)
GFR calc non Af Amer: 59 mL/min — ABNORMAL LOW (ref >60)
Glucose, Bld: 434 mg/dL — ABNORMAL HIGH (ref 65–99)
Potassium: 4.2 mmol/L (ref 3.5–5.1)
Sodium: 136 mmol/L (ref 135–145)

## 2018-01-21 LAB — POCT GLYCOSYLATED HEMOGLOBIN (HGB A1C): Hemoglobin A1C: 9.4

## 2018-01-21 LAB — GLUCOSE, CAPILLARY
Glucose-Capillary: 271 mg/dL — ABNORMAL HIGH (ref 65–99)
Glucose-Capillary: 418 mg/dL — ABNORMAL HIGH (ref 65–99)
Glucose-Capillary: 294 mg/dL — ABNORMAL HIGH (ref 65–99)
Glucose-Capillary: 268 mg/dL — ABNORMAL HIGH (ref 65–99)

## 2018-01-21 LAB — INFLUENZA PANEL BY PCR (TYPE A & B)
Influenza A By PCR: POSITIVE — AB
Influenza B By PCR: NEGATIVE

## 2018-01-21 MED ORDER — TRAZODONE HCL 100 MG PO TABS
100.0000 mg | ORAL_TABLET | Freq: Every evening | ORAL | Status: DC | PRN
  Administered 2018-01-22: 100 mg via ORAL
  Filled 2018-01-21: qty 1

## 2018-01-21 MED ORDER — ENOXAPARIN SODIUM 60 MG/0.6ML ~~LOC~~ SOLN
60.0000 mg | SUBCUTANEOUS | Status: DC
  Administered 2018-01-21 – 2018-01-22 (×2): 60 mg via SUBCUTANEOUS
  Filled 2018-01-21 (×2): qty 0.6

## 2018-01-21 MED ORDER — IPRATROPIUM-ALBUTEROL 0.5-2.5 (3) MG/3ML IN SOLN: 3.0000 mL | RESPIRATORY_TRACT | Status: DC | PRN

## 2018-01-21 MED ORDER — BENAZEPRIL HCL 40 MG PO TABS
40.0000 mg | ORAL_TABLET | Freq: Every day | ORAL | Status: DC
  Administered 2018-01-22 – 2018-01-23 (×2): 40 mg via ORAL
  Filled 2018-01-21 (×2): qty 1

## 2018-01-21 MED ORDER — POLYETHYLENE GLYCOL 3350 17 G PO PACK
17.0000 g | PACK | Freq: Every day | ORAL | Status: DC | PRN
Start: 2018-01-21 — End: 2018-01-23

## 2018-01-21 MED ORDER — BUPROPION HCL ER (XL) 150 MG PO TB24
300.0000 mg | ORAL_TABLET | Freq: Every day | ORAL | Status: DC
  Administered 2018-01-22 – 2018-01-23 (×2): 300 mg via ORAL
  Filled 2018-01-21 (×2): qty 2

## 2018-01-21 MED ORDER — FLUTICASONE PROPIONATE 50 MCG/ACT NA SUSP
1.0000 | Freq: Every day | NASAL | Status: DC
  Administered 2018-01-22 – 2018-01-23 (×2): 1 via NASAL
  Filled 2018-01-21: qty 16

## 2018-01-21 MED ORDER — INSULIN ASPART 100 UNIT/ML ~~LOC~~ SOLN
6.0000 [IU] | Freq: Three times a day (TID) | SUBCUTANEOUS | Status: DC
  Administered 2018-01-21 – 2018-01-23 (×7): 6 [IU] via SUBCUTANEOUS

## 2018-01-21 MED ORDER — OXYCODONE-ACETAMINOPHEN 5-325 MG PO TABS
1.0000 | ORAL_TABLET | ORAL | Status: DC | PRN
  Administered 2018-01-21 – 2018-01-22 (×3): 2 via ORAL
  Filled 2018-01-21 (×3): qty 2

## 2018-01-21 MED ORDER — SERTRALINE HCL 100 MG PO TABS
100.0000 mg | ORAL_TABLET | Freq: Every day | ORAL | Status: DC
  Administered 2018-01-22: 100 mg via ORAL
  Filled 2018-01-21: qty 1

## 2018-01-21 MED ORDER — INSULIN ASPART 100 UNIT/ML ~~LOC~~ SOLN
0.0000 [IU] | Freq: Three times a day (TID) | SUBCUTANEOUS | Status: DC
  Administered 2018-01-22: 11 [IU] via SUBCUTANEOUS
  Administered 2018-01-22: 3 [IU] via SUBCUTANEOUS
  Administered 2018-01-22: 7 [IU] via SUBCUTANEOUS
  Administered 2018-01-23: 11 [IU] via SUBCUTANEOUS
  Administered 2018-01-23 (×2): 7 [IU] via SUBCUTANEOUS

## 2018-01-21 MED ORDER — INSULIN ASPART 100 UNIT/ML ~~LOC~~ SOLN
0.0000 [IU] | Freq: Every day | SUBCUTANEOUS | Status: DC
  Administered 2018-01-21: 3 [IU] via SUBCUTANEOUS
  Administered 2018-01-22: 4 [IU] via SUBCUTANEOUS

## 2018-01-21 MED ORDER — OSELTAMIVIR PHOSPHATE 75 MG PO CAPS
75.0000 mg | ORAL_CAPSULE | Freq: Two times a day (BID) | ORAL | Status: DC
  Administered 2018-01-21 – 2018-01-23 (×4): 75 mg via ORAL
  Filled 2018-01-21 (×5): qty 1

## 2018-01-21 MED ORDER — AMLODIPINE BESYLATE 10 MG PO TABS
10.0000 mg | ORAL_TABLET | Freq: Every day | ORAL | Status: DC
  Administered 2018-01-22 – 2018-01-23 (×2): 10 mg via ORAL
  Filled 2018-01-21 (×2): qty 1

## 2018-01-21 MED ORDER — DIPHENHYDRAMINE HCL 25 MG PO CAPS
25.0000 mg | ORAL_CAPSULE | Freq: Three times a day (TID) | ORAL | Status: DC | PRN
  Administered 2018-01-21 – 2018-01-22 (×2): 25 mg via ORAL
  Filled 2018-01-21 (×3): qty 1

## 2018-01-21 MED ORDER — INSULIN ASPART 100 UNIT/ML ~~LOC~~ SOLN
22.0000 [IU] | Freq: Once | SUBCUTANEOUS | Status: AC
  Administered 2018-01-21: 22 [IU] via SUBCUTANEOUS

## 2018-01-21 MED ORDER — ATENOLOL 50 MG PO TABS
100.0000 mg | ORAL_TABLET | Freq: Every day | ORAL | Status: DC
  Administered 2018-01-22 – 2018-01-23 (×2): 100 mg via ORAL
  Filled 2018-01-21 (×2): qty 2

## 2018-01-21 MED ORDER — FAMOTIDINE 20 MG PO TABS
20.0000 mg | ORAL_TABLET | Freq: Every day | ORAL | Status: DC
Start: 2018-01-22 — End: 2018-01-23
  Administered 2018-01-22 – 2018-01-23 (×2): 20 mg via ORAL
  Filled 2018-01-21 (×2): qty 1

## 2018-01-21 NOTE — Progress Notes (Signed)
Pt room has been prepared for suicide precautions. Trained sitter is at the bedside.  Pt with CBG = 418. MD notified that CBG > 400. Awaiting response. Dorthey Sawyer, RN

## 2018-01-21 NOTE — Assessment & Plan Note (Addendum)
This problem is chronic and worsened. She has a long history of uncontrolled depression. She has not wanted to go see outpatient psychiatry as she does not have the co-pay for the visit. She currently is on Zoloft and Wellbutrin. She has had significant stressors in her home life. She and her sons lost their apartment and she is now living with family members. She states she has a room to herself but that it is not heated. She states because of this, she has gotten the flu. When I asked about suicide or homicidal ideation, the patient would not either endorse or deny this as I would "not let her leave".   Her exam today was much different than prior. She had no eye contact and was fatalistic. She always has good eye contact and always states that she will make it through. PHQ9 27.  Because I cannot rule out suicidal ideation, she needs to be admitted and evaluated by psychiatry. My inclination, is that she has passive suicidal ideation and not active. I have requested a room with telemonitoring rather then suicide precautions with a sitter. She understands that she will be evaluated by psychiatry.  PLAN : admit for pt safety pysch eval

## 2018-01-21 NOTE — Assessment & Plan Note (Signed)
This problem is new. She states for the past week and a half, she has had fever, increased cough, increased dyspnea, D, and increased myalgias. She states she feels like she has the flu. She got the vaccine 10/4.   PLAN : flu screen

## 2018-01-21 NOTE — Assessment & Plan Note (Signed)
This problem is chronic and worsened. This is been treated as an outpatient with powders and creams that the patient states it is worse and is mal odorous. I was not able to examine this as the patient does not have her prosthesis and was unable to stand. Once she has been transferred to a hospital bed, we will be able to examine this area and see what care she needs.  PLAN : exam once in hospital bed

## 2018-01-21 NOTE — Addendum Note (Signed)
Addended by: Larey Dresser A on: 01/21/2018 02:26 PM   Modules accepted: Orders

## 2018-01-21 NOTE — Progress Notes (Signed)
   Subjective:    [name], female    DOB: 28-Feb-1961, 57 y.o.   MRN: [id number]  HPI  Jennifer [name] is here for DMF/U. Please see the A&P for the status of the pt's chronic medical problems.  ROS : per ROS section and in problem oriented charting. All other systems are negative.  PMHx, Soc hx, and / or Fam hx : Lost her apartment. She and her two sons now living with family.  Review of Systems  Constitutional: Positive for chills and fever.       No appetite  Respiratory: Positive for cough and shortness of breath.   Cardiovascular: Positive for leg swelling.  Gastrointestinal: Positive for abdominal pain and diarrhea. Negative for blood in stool.  Endocrine: Positive for cold intolerance and polyuria.  Genitourinary: Positive for dysuria and frequency.       Bowel and bladder incontinence  Musculoskeletal: Positive for arthralgias, back pain, gait problem and myalgias.  Skin: Positive for rash.       itching  Neurological: Positive for headaches.  Psychiatric/Behavioral: Positive for sleep disturbance.       Will not endorse or deny SI or HI       Objective:   Physical Exam  Constitutional: She appears well-developed and well-nourished. She appears distressed.  HENT:  Head: Normocephalic and atraumatic.  Nose: Nose normal.  Eyes: Conjunctivae and EOM are normal.  Cardiovascular: Normal rate, regular rhythm and normal heart sounds.  Pulmonary/Chest: Breath sounds normal. No respiratory distress. She has no wheezes.  Abdominal: Bowel sounds are normal.  Obese  Musculoskeletal: She exhibits edema. She exhibits no tenderness.  Neurological: She is alert.  Skin: Skin is warm and dry. She is not diaphoretic.  Punctate lesions over limbs and trunk. Dry skin. Could not exam L groin as pt without prosthesis to stand.  Psychiatric: Her speech is normal. Judgment normal. Her affect is blunt. She is withdrawn. Cognition and memory are normal. She exhibits a  depressed mood.  No eye contact which is unusual for her. Tearful, also unusual. Fatalistic      Assessment & Plan:

## 2018-01-21 NOTE — H&P (Signed)
Date: 01/21/2018               Patient Name:  Jennifer [name] MRN: [id number]  DOB: July 31, 1961 Age / Sex: 57 y.o., female   PCP: Bartholomew Crews, MD         Medical Service: Internal Medicine Teaching Service         Attending Physician: Dr. Oval Linsey, MD    First Contact: Dr. Tarri Abernethy Pager: [phone]  Second Contact: Dr. 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 Approximately 6-7 weeks ago she was evicted from her house and moved into [facility], where she resided until the beginning of January (8th). [place] was helping the patient figure out where she could move to the next; however, the patient declined further assistance and subsequently moved in with her brother and sister. Unfortunately she reports her living situations with her brother and sister are not ideal. She states that for the past 2 to 3 weeks they have shut off the heat and she is starting to come down with a cold. She describes symptoms including fever, increased, dyspnea, headaches, and myalgias.  In addition to the acute illness and housing stressors, yesterday was a three-year anniversary of her mother's death which has worsened her mood. She states that she is always depressed and sometimes thinks of "cleaning her plate." When asked specifically what she means she states that "you know what it means." When asked if she had a plan to end her life she said, "I wouldn't tell you." She thinks of death daily and has done so for several weeks. She denies any symptoms of mania or hypomania past or present. She does have access to guns at home and is  not willing to disclose whether she has tried to commit suicide.   Meds:  Current Meds  Medication Sig  . ADVAIR DISKUS 250-50 MCG/DOSE AEPB TAKE 1 INHALATION BY MOUTH TWICE DAILY  . albuterol (PROVENTIL) (2.5 MG/3ML) 0.083% nebulizer solution INHALE THE CONTENTS OF 1 VIAL VIA NEBULIZER EVERY 6 HOURS AS NEEDED FOR WHEEZING  . amLODipine (NORVASC) 10 MG tablet TAKE 1 TABLET BY MOUTH EVERY DAY  . atenolol (TENORMIN) 100 MG tablet TAKE 1 TABLET BY MOUTH EVERY DAY  . baclofen (LIORESAL) 10 MG tablet TAKE 1 TABLET BY MOUTH THREE TIMES A DAY AS NEEDED FOR MUSCLE SPASMS  . benazepril (LOTENSIN) 40 MG tablet TAKE 1 TABLET BY MOUTH ONCE DAILY  . buPROPion (WELLBUTRIN XL) 300 MG 24 hr tablet TAKE 1 TABLET BY MOUTH ONCE DAILY  . EASY TOUCH PEN NEEDLES 31G X 8 MM MISC USE TO INJECT INSULIN TWICE DAILY  . fluticasone (FLONASE) 50 MCG/ACT nasal spray INSTILL 1 SPRAY IN EACH NOSTRIL DAILY  . furosemide (LASIX) 40 MG tablet TAKE 1 TABLET BY MOUTH DAILY AS NEEDED  . glucose blood (ONETOUCH VERIO) test strip Use to check blood sugar 3 to 4 times daily. diag code E11.51.insulin dependent  . hydrochlorothiazide (HYDRODIURIL) 25 MG tablet TAKE 1 TABLET BY MOUTH EVERY DAY  . Insulin Lispro Prot & Lispro (HUMALOG MIX 75/25 KWIKPEN) (75-25) 100 UNIT/ML Kwikpen Inject 130  Units into the skin daily with breakfast AND 85 Units daily before supper. INJECT 130 UNITS SUBCUTANEOUSLY EVERY MORNING BEFORE BREAKFAST AND 85 UNITS BEFORE SUPPER.  . Insulin Pen Needle (EASY TOUCH PEN NEEDLES) 31G X 8 MM MISC Inject 100 each into the skin 2 (two) times daily. Use to inject insulin into the skin 2 times daily. diag code E11.21. Insulin dependent  . ipratropium (ATROVENT) 0.02 % nebulizer solution USE 1 VIAL VIA NEBULIZER EVERY 6 HOURS AS NEEDED FOR WHEEZING  . LYRICA 200 MG capsule TAKE 1 CAPSULE BY MOUTH THREE TIMES A DAY  . metFORMIN (GLUCOPHAGE-XR) 500 MG 24 hr tablet TAKE 1 TABLET BY MOUTH EVERY DAY WITH BREAKFAST  . nystatin  (MYCOSTATIN/NYSTOP) powder APPLY TO AFFECTED AREA THREE TIMES A DAY  . ONETOUCH DELICA LANCETS 97J MISC USE TO CHECK BLOOD SUGARS FOUR TIMES A DAY  . ONETOUCH VERIO test strip USE 3-4 TIMES DAILY TO CHECK BLOOD SUGAR  . oxyCODONE-acetaminophen (PERCOCET/ROXICET) 5-325 MG tablet Take 1-2 tablets by mouth every 4 (four) hours as needed for severe pain.  . potassium chloride SA (K-DUR,KLOR-CON) 20 MEQ tablet TAKE 2 TABLETS BY MOUTH DAILY  . predniSONE (DELTASONE) 20 MG tablet Take 2 tablets (40 mg total) by mouth daily with breakfast. For 5 days  . PROAIR HFA 108 (90 Base) MCG/ACT inhaler INHALE 2 PUFFS BY MOUTH EVERY 6 HOURS AS NEEDED FOR WHEEZING  . ranitidine (ZANTAC) 150 MG tablet TAKE 1 TABLET BY MOUTH TWICE DAILY  . rosuvastatin (CRESTOR) 20 MG tablet TAKE 1 TABLET BY MOUTH AT BEDTIME  . sertraline (ZOLOFT) 100 MG tablet TAKE 1 TABLET BY MOUTH EVERY DAY  . traZODone (DESYREL) 100 MG tablet TABLET BY MOUTH DAILY AT BEDTIME AS NEEDED FOR SLEEP   Allergies: Allergies as of 01/21/2018 - Review Complete 01/21/2018  Allergen Reaction Noted  . Abilify [aripiprazole] Other (See Comments) 11/09/2015  . Iohexol  08/17/2007  . Ivp dye [iodinated diagnostic agents]  05/10/2013  . Morphine sulfate Itching and Rash    Past Medical History:  Diagnosis Date  . Anginal pain [facility])    '3' of 10 ischemia ruled out 9/9   . Arthritis of lumbar spine   . Asthma   . CHF (congestive heart failure) ([place])   . 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 Chronic bronchitis ([place])    "I get it alot" (09/28/2013)  . Chronic diastolic heart failure (HCC)    grade 2 per 2D echocardiogram (01/2013)  . Chronic lower back pain   . Chronic osteomyelitis of foot (HCC)    chronic, right secondary to diabetic foot ulcers  . Chronic pain syndrome 12/03/2011   Likely secondary to depression, "fibromyalgia", neuropathy, and obesity. Lumbar MRI 2014 no sig change from prior (2008) : Stable hypertrophic facet  disease most notable at L4-5. Stable shallow left foraminal/extraforaminal disc protrusion at L4-5. No direct neural compression.      Marland Kitchen COPD 01/08/2007   PFT's 05/2007 : FEV1/FVC 82, FEV1 64% pred, FEF 25-75% 40% predicted, 16% improvement in FEV1 with bronchodilators.     . Depression   . Diabetic peripheral neuropathy ([place])   . DVT of upper extremity (deep vein thrombosis) ([place]) 03/11/2013   Secondary to PICC line. Right brachial vein, diagnosed on 03/10/2013 Coumadin for 3 months. End date 06/10/2013   . Environmental allergies    Hx: of  . Exertional shortness of breath   . Fatty liver 2003   observed on ultrasound abdomen  .  Fibromyalgia   . GERD (gastroesophageal reflux disease)   . Glaucoma   . Hyperlipidemia   . Hyperplastic colon polyp 12/2010   Per colonoscopy (12/2010) - Dr. Deatra Ina  . Hypertension   . 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 Blood cultures 01/26/13 S. Aureus and GBS. Blood cultures Feb 6th, 8th, and 9th and March were negative.Repeat TEE 3/20 negative for vegitations  . 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 2/2 tobacco use, DM, and cont weight bearing on surgical wound and developed gangrene   . Polymicrobial bacterial infection 01/2013   GBS and S. aureus bacteremia // Source likely infected diabetic foot ulcer  . PVD (peripheral vascular disease) with claudication ([place])    Stents to bilateral common iliac arteries (left 2005, right 2008), on chronic plavix  . Rheumatoid arthritis ([place])   . Tobacco abuse   . Type II diabetes mellitus with peripheral circulatory disorders, uncontrolled DX: 1993   Insulin dep. Poor control. Complicated by diabetic foot ulcer and diabetic eye disease.    Marland Kitchen Ulcer of foot, chronic (HCC)    Left. No OM per MRI (01/2013)   Family History:  Mother: + Diverticulitis, DM, HTN, HF Father: + Asthma  Sister: +  CAD  Social History:  Previously worked in Teacher, adult education, psychology  Homeless Has 2 sons  Current smoker  Denies the use of EtOH or illicit substances  Review of Systems: A complete ROS was negative except as per HPI.   Physical Exam: Height 5\' 7"  (1.702 m), weight 272 lb 4.3 oz (123.5 kg).  General: Obese female with depressed mood  HENT: Normocephalic, atraumatic, moist mucus membranes  Pulm: Good air movement with no wheezing or crackles CV: Regular rate and rhythm, no murmurs, no rubs Abdomen: Active bowel sounds, soft, no understanding, no tenderness to palpation Extremities: Left below the knee amputation. Moderate pitting edema of the right lower extremity Skin: Multiple erythematous papules on her arms and back. Otherwise skin is warm and dry Neuro: Alert and oriented times three Psych: Depressed mood but no psychomotor slowing   Assessment & Plan by Problem: Active Problems:   Depression with suicidal ideation   Influenza A  MDD with SI. 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 She has multiple life stressors including recent homelessness, anniversary of the death of her mother, and acute illness. She has tried multiple medications including escitalopram, citalopram, amitriptyline, Wellbutrin, Zoloft, Duloxetine, and Abilify without much of a response. She has not tried psychotherapy. She does appear to have SI and access to weapons. Support system includes her 2 sons, otherwise denies good support system.  - Continue Wellbutrin, trazodone, and Zoloft for now  - Admit to med-surg  - Suicide precautions  - Inpatient psych consult for medication recommendations and evaluation for inpatient psych placement   Influenza A -  Presented with fever, increased,, dyspnea, headaches, and myalgias. - Influenza A positive  - Starting Tamiflu  with droplet precautions  - Breathing treatments PRN  Uncontrolled DM  - A1c 9.4, on Humalog 75/25 130 units every morning and 85 units with supper and metformin  - Will do resistant SSI and add long acting insulin tomorrow   Hypertension  - Continue Amlodipine 10 mg, Atenolol 100 mg, and Benazepril  40 mg - Holding HCTZ 25 mg QD and Lasix 40 mg QD  Diet: Carb modified  VTE ppx: Lovenox  CODE STATUS: Full Code  Dispo: Admit patient to Observation with expected length of stay less than 2 midnights.  Signed: Ina Homes, MD 01/21/2018, 4:38 PM  My Pager: [phone]

## 2018-01-22 DIAGNOSIS — L739 Follicular disorder, unspecified: Secondary | ICD-10-CM | POA: Diagnosis not present

## 2018-01-22 DIAGNOSIS — R05 Cough: Secondary | ICD-10-CM | POA: Diagnosis not present

## 2018-01-22 DIAGNOSIS — F341 Dysthymic disorder: Secondary | ICD-10-CM | POA: Diagnosis not present

## 2018-01-22 DIAGNOSIS — Z590 Homelessness: Secondary | ICD-10-CM | POA: Diagnosis not present

## 2018-01-22 DIAGNOSIS — G4700 Insomnia, unspecified: Secondary | ICD-10-CM

## 2018-01-22 DIAGNOSIS — B372 Candidiasis of skin and nail: Secondary | ICD-10-CM

## 2018-01-22 DIAGNOSIS — F339 Major depressive disorder, recurrent, unspecified: Secondary | ICD-10-CM

## 2018-01-22 DIAGNOSIS — F419 Anxiety disorder, unspecified: Secondary | ICD-10-CM

## 2018-01-22 DIAGNOSIS — Z89519 Acquired absence of unspecified leg below knee: Secondary | ICD-10-CM | POA: Diagnosis not present

## 2018-01-22 DIAGNOSIS — F17210 Nicotine dependence, cigarettes, uncomplicated: Secondary | ICD-10-CM

## 2018-01-22 DIAGNOSIS — R509 Fever, unspecified: Secondary | ICD-10-CM | POA: Diagnosis not present

## 2018-01-22 DIAGNOSIS — J101 Influenza due to other identified influenza virus with other respiratory manifestations: Secondary | ICD-10-CM

## 2018-01-22 DIAGNOSIS — E785 Hyperlipidemia, unspecified: Secondary | ICD-10-CM

## 2018-01-22 DIAGNOSIS — J111 Influenza due to unidentified influenza virus with other respiratory manifestations: Secondary | ICD-10-CM

## 2018-01-22 DIAGNOSIS — E1151 Type 2 diabetes mellitus with diabetic peripheral angiopathy without gangrene: Secondary | ICD-10-CM | POA: Diagnosis not present

## 2018-01-22 DIAGNOSIS — R1013 Epigastric pain: Secondary | ICD-10-CM | POA: Diagnosis not present

## 2018-01-22 DIAGNOSIS — R450 Nervousness: Secondary | ICD-10-CM | POA: Diagnosis not present

## 2018-01-22 DIAGNOSIS — E1142 Type 2 diabetes mellitus with diabetic polyneuropathy: Secondary | ICD-10-CM

## 2018-01-22 DIAGNOSIS — I10 Essential (primary) hypertension: Secondary | ICD-10-CM

## 2018-01-22 DIAGNOSIS — M797 Fibromyalgia: Secondary | ICD-10-CM | POA: Diagnosis not present

## 2018-01-22 LAB — CBC
HCT: 37.9 % (ref 36.0–46.0)
Hemoglobin: 11.8 g/dL — ABNORMAL LOW (ref 12.0–15.0)
MCH: 30.5 pg (ref 26.0–34.0)
MCHC: 31.1 g/dL (ref 30.0–36.0)
MCV: 97.9 fL (ref 78.0–100.0)
Platelets: 139 10*3/uL — ABNORMAL LOW (ref 150–400)
RBC: 3.87 MIL/uL (ref 3.87–5.11)
RDW: 15.4 % (ref 11.5–15.5)
WBC: 5.5 10*3/uL (ref 4.0–10.5)

## 2018-01-22 LAB — GLUCOSE, CAPILLARY
Glucose-Capillary: 290 mg/dL — ABNORMAL HIGH (ref 65–99)
Glucose-Capillary: 236 mg/dL — ABNORMAL HIGH (ref 65–99)
Glucose-Capillary: 133 mg/dL — ABNORMAL HIGH (ref 65–99)
Glucose-Capillary: 319 mg/dL — ABNORMAL HIGH (ref 65–99)

## 2018-01-22 MED ORDER — PREGABALIN 100 MG PO CAPS
100.0000 mg | ORAL_CAPSULE | Freq: Three times a day (TID) | ORAL | Status: DC
  Administered 2018-01-22 – 2018-01-23 (×4): 100 mg via ORAL
  Filled 2018-01-22 (×4): qty 1

## 2018-01-22 MED ORDER — DOXYCYCLINE HYCLATE 100 MG PO TABS
100.0000 mg | ORAL_TABLET | Freq: Two times a day (BID) | ORAL | Status: DC
  Administered 2018-01-22 – 2018-01-23 (×2): 100 mg via ORAL
  Filled 2018-01-22 (×2): qty 1

## 2018-01-22 MED ORDER — ALBUTEROL SULFATE (2.5 MG/3ML) 0.083% IN NEBU
2.5000 mg | INHALATION_SOLUTION | RESPIRATORY_TRACT | Status: DC | PRN
Start: 2018-01-22 — End: 2018-01-23

## 2018-01-22 MED ORDER — HYDROXYZINE HCL 25 MG PO TABS
25.0000 mg | ORAL_TABLET | Freq: Three times a day (TID) | ORAL | Status: DC | PRN
  Administered 2018-01-22: 25 mg via ORAL
  Filled 2018-01-22: qty 1

## 2018-01-22 MED ORDER — SERTRALINE HCL 50 MG PO TABS
150.0000 mg | ORAL_TABLET | Freq: Every day | ORAL | Status: DC
  Administered 2018-01-23: 150 mg via ORAL
  Filled 2018-01-22: qty 1

## 2018-01-22 MED ORDER — IPRATROPIUM-ALBUTEROL 0.5-2.5 (3) MG/3ML IN SOLN
3.0000 mL | Freq: Four times a day (QID) | RESPIRATORY_TRACT | Status: DC
  Administered 2018-01-22 – 2018-01-23 (×2): 3 mL via RESPIRATORY_TRACT
  Filled 2018-01-22 (×3): qty 3

## 2018-01-22 MED ORDER — FLUCONAZOLE 150 MG PO TABS
150.0000 mg | ORAL_TABLET | ORAL | Status: DC
  Administered 2018-01-22: 150 mg via ORAL
  Filled 2018-01-22: qty 1

## 2018-01-22 MED ORDER — INSULIN GLARGINE 100 UNIT/ML ~~LOC~~ SOLN
30.0000 [IU] | Freq: Every day | SUBCUTANEOUS | Status: DC
  Administered 2018-01-22: 30 [IU] via SUBCUTANEOUS
  Filled 2018-01-22 (×2): qty 0.3

## 2018-01-22 NOTE — Clinical Social Work Note (Signed)
CSW advised that patient needs to talk with a Education officer, museum. Visit made with patient (5:33 pm) and Ms. Kohls expressed that she needs somewhere to stay. Patient reports that she cannot return to her sister's home. When asked her 2 adult sons are currently living there and they both just recently lost their jobs. Patient reported that she receives SSA disability of a little over $1,000 per month. Ms. Allshouse reported that she has stayed in public housing and was put out. Community resources discussed and patient given information regarding [place], [employer] [facility] [place], and [employer]. CSW explained that once ready for discharge (if d/c disposition home as patient has been seen by psych for suicidal ideations and possible psych placement), SW will assist her with getting to a shelter.   As of the writing of this note, psychiatry evaluation not in Epic, however nursing was advised by psychiatry that the suicide sitter could be discontinued. CSW will continue to follow and assist as needed with d/c disposition.  Saja Bartolini Givens, MSW, LCSW Licensed Clinical Social Worker [place] [phone]

## 2018-01-22 NOTE — Care Management Obs Status (Signed)
[facility] NOTIFICATION   Patient Details  Name: MINAL STULLER MRN: [id number] Date of Birth: Mar 16, 1961   Medicare Observation Status Notification Given:  Yes    Carles Collet, RN 01/22/2018, 3:58 PM

## 2018-01-22 NOTE — Progress Notes (Signed)
Internal Medicine Attending  Date: 01/22/2018  Patient name: Jennifer [name] Medical record number: [id number] Date of birth: 29-Jun-1961 Age: 57 y.o. Gender: female  I saw and evaluated the patient. I reviewed the resident's note by Dr. Trilby Drummer and I agree with the resident's findings and plans as documented in his progress note.  Please see my H&P dated 01/22/2018 for the specifics of my evaluation, assessment, and plan from earlier in the day. We appreciate social work's assistance with finding shelter upon discharge. We are awaiting psychiatry's assessment before making final disposition plans.

## 2018-01-22 NOTE — Progress Notes (Signed)
Dr. Buford Dresser gave a verbal order to discontinue suicide sitter.

## 2018-01-22 NOTE — Progress Notes (Addendum)
Subjective: Ms Gibeault was seen resting in her bed this morning. She was tearful and frustrated this morning. She stated that she was supposed to be seen again last night but no one came back by. She states today that she has not had thoughts of hurting herself in years and that she was ment to tell the admitting team that she had SI in the past because if she didn't they would know from the chart. She additionally complains of itching and some pain of her arms and back associated with skin changes and unhealing intertrigo under the left side of her abdominal panus. She additionally requested assistance with finding somewhere to live as she was recently evicted and has been living with her family who she state have turning off the heat and laughing at her among other things.  Objective:  Vital signs in last 24 hours: Vitals:   01/21/18 2001 01/22/18 0555 01/22/18 0731 01/22/18 1642  BP: (!) 152/58 103/81 (!) 147/62 (!) 166/66  Pulse: 83 93 79 (!) 59  Resp: (!) 22 16  20   Temp: 99.8 F (37.7 C) 98.1 F (36.7 C) 98.2 F (36.8 C) 98 F (36.7 C)  TempSrc:   Oral Oral  SpO2: 93% 91% 97% 98%  Weight:      Height:       Physical Exam  Constitutional: She is oriented to person, place, and time. She appears well-developed and well-nourished.  Obese Female  HENT:  Head: Normocephalic and atraumatic.  Eyes: EOM are normal. Right eye exhibits no discharge. Left eye exhibits no discharge.  Cardiovascular: Normal rate, regular rhythm, normal heart sounds and intact distal pulses.  Pulmonary/Chest: Effort normal. No respiratory distress. She has wheezes.  Abdominal: Soft. Bowel sounds are normal. She exhibits no distension. There is no tenderness.  Musculoskeletal: She exhibits no edema.  LLE BKA  Neurological: She is alert and oriented to person, place, and time.  Skin: Skin is warm and dry.  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 She has multiple life stressors including recent homelessness, anniversary of the death of her mother, and acute illness. She has tried multiple medications including escitalopram, citalopram, amitriptyline, Wellbutrin, Zoloft, Duloxetine, and Abilify without much of a response. She has not tried psychotherapy. She does appear to have SI and access to weapons. Support system includes her 2 sons, otherwise denies good support system.  - Appreciate Psychiatry recommendations - Continue Wellbutrin, trazodone - Increase Zoloft to 150mg  - Resume Home [place]  - Discontinue suicide precautions, per psych  - Consult to SW for recent homelessness  Influenza A: Presented with fever, increased, dyspnea, headaches, and myalgias. Influenza A positive. Wheezing on exam. Afebrile 2/1. - Continue supportive care - Tamiflu for 5 day course (starting 2/1) - Droplet precautions  - Duoneb changed from PRN to scheduled - Add PRN Albuterol  Folliculitis: Widespread folliculitis with associated itching and mild pain with pressure. - Doxycycline 100mg  BID for 7 days  Candidal Intertrigo of Abdominal Panus: Persistent despite treatment with nystatin powder. Woods lamp evaluation shows  only faint pink fluorescence due to residue from powders and creams. No salmon or red fluorescence. - Fluconazole 150mg  qWeek x 4 weeks  Uncontrolled DM: A1c 9.4, on Humalog 75/25 130 units every morning and 85 units with supper and metformin  - Lantus 30U QHS - Novolog 6U TID AC - R-SSI, TID AC w/ QHS coverage  Hypertension: Normo  to Hypertensive this admission - Continue Amlodipine 10 mg, Atenolol 100 mg, and Benazepril 40 mg - Holding HCTZ 25 mg QD and Lasix 40 mg QD  Diet: Carb modified  VTE ppx: Lovenox  CODE STATUS: Full Code  Dispo: Anticipated discharge in approximately 1-3 day(s).   Neva Seat, MD 01/22/2018, 5:25 PM Pager: [phone]

## 2018-01-22 NOTE — Consult Note (Signed)
[facility] Consult   Reason for Consult:  Depression with SI Referring Physician:  Dr. Eppie Gibson  Patient Identification: BAMBIE PIZZOLATO MRN:  [id number] Principal Diagnosis: Persistent depressive disorder Diagnosis:   Patient Active Problem List   Diagnosis Date Noted  . Flu-like symptoms [R68.89] 01/21/2018  . Depression with suicidal ideation [F32.9, R45.851] 01/21/2018  . Influenza A [J10.1] 01/21/2018  . Candidal intertrigo [B37.2] 12/18/2017  . OSA (obstructive sleep apnea) [G47.33] 06/12/2017  . Morbid obesity with BMI of 40.0-44.9, adult ([place]) [E66.01, Z68.41] 03/26/2017  . Toe amputation status, right ([place]) [Y69.485] 01/16/2017  . Thrombocytopenia ([place]) [D69.6] 06/05/2016  . Myoclonus [G25.3] 11/12/2015  . Type 2 diabetes with nephropathy ([place]) [E11.21] 09/07/2015  . Uncontrolled diabetes mellitus with retinopathy, due to underlying condition, without macular edema ([place]) [E08.319, E08.65] 09/05/2015  . Diabetic retinopathy ([place]) [E11.319] 09/05/2015  . Counseling regarding end of life decision making [Z71.89] 06/14/2015  . Atherosclerosis of aorta ([place]) [I70.0] 04/04/2015  . Anemia [D64.9] 10/05/2014  . Chronic diastolic heart failure ([place]) [I50.32]   . S/P BKA (below knee amputation) unilateral ([place]) [Z89.519]   . Tobacco abuse [Z72.0]   . Severe obesity (BMI >= 40) ([place]) [E66.01] 03/02/2013  . Abnormality of gait [R26.9] 03/01/2013  . Healthcare maintenance [Z00.00] 07/10/2012  . Chronic prescription opiate use [Z79.891] 12/03/2011  . Secondary diabetes mellitus with peripheral vascular disease ([place]) [E13.51] 08/27/2011  . 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 Hypertension associated with diabetes ([place]) [E11.59, I10] 11/29/2009  . Chronic insomnia [F51.04] 10/25/2009  . GASTROESOPHAGEAL REFLUX DISEASE [K21.9] 11/24/2008  . Depression, major, severe recurrence ([place]) [F33.2] 04/06/2008  . DM (diabetes mellitus) type II  uncontrolled, periph vascular disorder ([place]) [E11.51, E11.65] 04/02/2007  . Hyperlipidemia associated with type 2 diabetes mellitus ([place]) [E11.69, E78.5] 01/08/2007  . COPD (chronic obstructive pulmonary disease) ([place]) [J44.9] 01/08/2007    Total Time spent with patient: 1 hour  Subjective:   LUIZA CARRANCO is a 57 y.o. female patient admitted with Influenza A.  HPI:   Per chart review, patient was admitted with increasing dyspnea, fever, headaches and myalgia. She tested positive for Influenza A and is receiving Tamiflu. She reported depression with SI on admission. 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 She reports multiple psychosocial stressors including recent homelessness, anniversary date of her mother's death and acute illness. She has tried multiple medications including Lexapro, Celexa, Amitriptyline, Wellbutrin, Zoloft, Cymbalta and Abilify with poor response. She has SI with access to weapons. She is prescribed Wellbutrin 300 mg daily, Zoloft 100 mg daily and Trazodone 100 mg qhs PRN.   On interview, Ms. Wangerin reports that she has been depressed since 2006 after losing her job. 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 She is not used to being unemployed. She has been homeless since October. She currently lives with her family but reports that the household is a chaotic environment due to multiple family members living in one house.She has not been able to afford her medications. She is only taking Trazodone. She reports that Wellbutrin and Zoloft caused intermittent visual hallucinations. She stopped taking them a month ago. She has not experienced further hallucinations. She reports poor sleep with problems initiating and maintaining sleep. She has OSA but does not wear a CPAP. She also reports poor appetite with weight gain. She reports that was of her weight  is secondary to fluid retention. She reports  intermittent periods of worsening anxiety. She denies SI. She reports that she told her primary team that she was suicidal in the past. She denies a history of suicide attempts.   Past Psychiatric History: Depression and anxiety.   Risk to Self: None. Denies SI.  Risk to Others:  Denies HI.  Prior Inpatient Therapy:  Denies  Prior Outpatient Therapy:  She is followed by Dr. [name].   Past Medical History:  Past Medical History:  Diagnosis Date  . Anginal pain [facility])    '3' of 10 ischemia ruled out 9/9   . Arthritis of lumbar spine   . Asthma   . CHF (congestive heart failure) ([place])   . Chordae tendinae rupture ([place]) 01/2013   question of   . Chronic bronchitis ([place])    "I get it alot" (09/28/2013)  . Chronic diastolic heart failure (HCC)    grade 2 per 2D echocardiogram (01/2013)  . Chronic lower back pain   . Chronic osteomyelitis of foot (HCC)    chronic, right secondary to diabetic foot ulcers  . Chronic pain syndrome 12/03/2011   Likely secondary to depression, "fibromyalgia", neuropathy, and obesity. Lumbar MRI 2014 no sig change from prior (2008) : Stable hypertrophic facet disease most notable at L4-5. Stable shallow left foraminal/extraforaminal disc protrusion at L4-5. No direct neural compression.      Marland Kitchen COPD 01/08/2007   PFT's 05/2007 : FEV1/FVC 82, FEV1 64% pred, FEF 25-75% 40% predicted, 16% improvement in FEV1 with bronchodilators.     . Depression   . Diabetic peripheral neuropathy ([employer])   . DVT of upper extremity (deep vein thrombosis) ([place]) 03/11/2013   Secondary to PICC line. Right brachial vein, diagnosed on 03/10/2013 Coumadin for 3 months. End date 06/10/2013   . Environmental allergies    Hx: of  . Exertional shortness of breath   . Fatty liver 2003   observed on ultrasound abdomen  . Fibromyalgia   . GERD (gastroesophageal reflux disease)   . Glaucoma   . Hyperlipidemia   . Hyperplastic colon polyp 12/2010   Per colonoscopy (12/2010) - Dr. Deatra Ina  .  Hypertension   . 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 Blood cultures 01/26/13 S. Aureus and GBS. Blood cultures Feb 6th, 8th, and 9th and March were negative.Repeat TEE 3/20 negative for vegitations  . 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 2/2 tobacco use, DM, and cont weight bearing on surgical wound and developed gangrene   . Polymicrobial bacterial infection 01/2013   GBS and S. aureus bacteremia // Source likely infected diabetic foot ulcer  . PVD (peripheral vascular disease) with claudication ([place])    Stents to bilateral common iliac arteries (left 2005, right 2008), on chronic plavix  . Rheumatoid arthritis ([place])   . Tobacco abuse   . Type II diabetes mellitus with peripheral circulatory disorders, uncontrolled DX: 1993   Insulin dep. Poor control. Complicated by diabetic foot ulcer and diabetic eye disease.    Marland Kitchen Ulcer of foot, chronic (HCC)    Left. No OM per MRI (01/2013)    Past Surgical History:  Procedure Laterality Date  . ABDOMINAL HYSTERECTOMY  1997   secondary to uterine fibroids  . AMPUTATION Left 08/31/2013   Procedure: AMPUTATION RAY;  Surgeon: Newt Minion, MD;  Location: [facility];  Service: Orthopedics;  Laterality: Left;  Left Foot 5th Ray Amputation  . AMPUTATION Left 09/28/2013   Procedure: Left Midfoot amputation;  Surgeon: Newt Minion, MD;  Location: [facility];  Service: Orthopedics;  Laterality: Left;  Left Midfoot amputation  . AMPUTATION Left 10/14/2013   Procedure: AMPUTATION BELOW KNEE- left;  Surgeon: Newt Minion, MD;  Location: [facility];  Service: Orthopedics;  Laterality: Left;  Left Below Knee Amputation   . AMPUTATION TOE Right 01/15/2017   Procedure: AMPUTATION 5th TOE RIGHT FOOT;  Surgeon: Edrick Kins, DPM;  Location: [facility];  Service: Podiatry;  Laterality: Right;  . BLADDER SURGERY     bladder reconstruction surgery  . BREAST  BIOPSY     multiple-benign per pt  . ESOPHAGOGASTRODUODENOSCOPY N/A 09/20/2013   Procedure: ESOPHAGOGASTRODUODENOSCOPY (EGD);  Surgeon: Jerene Bears, MD;  Location: [facility];  Service: Gastroenterology;  Laterality: N/A;  . FOOT AMPUTATION THROUGH METATARSAL Left 09/28/2013  . GANGLION CYST EXCISION     multiple  . PERIPHERAL VASCULAR INTERVENTION     stents in lower ext  . SHOULDER ARTHROSCOPY W/ ROTATOR CUFF REPAIR Bilateral   . SKIN SPLIT GRAFT Bilateral 05/13/2013   Procedure: Right and Left Foot Allograft Skin Graft;  Surgeon: Newt Minion, MD;  Location: [facility];  Service: Orthopedics;  Laterality: Bilateral;  Right and Left Foot Allograft Skin Graft  . TEE WITHOUT CARDIOVERSION N/A 01/31/2013   Procedure: TRANSESOPHAGEAL ECHOCARDIOGRAM (TEE);  Surgeon: Fay Records, MD;  Location: [facility];  Service: Cardiovascular;  Laterality: N/A;  Rm [phone]  . TEE WITHOUT CARDIOVERSION N/A 03/10/2013   Procedure: TRANSESOPHAGEAL ECHOCARDIOGRAM (TEE);  Surgeon: Larey Dresser, MD;  Location: [facility];  Service: Cardiovascular;  Laterality: N/A;  Rm. [phone]  . TOE AMPUTATION Left 08/31/2013   4TH & 5 TH TOE   . TONSILLECTOMY    . TUBAL LIGATION    . WRIST SURGERY Right    "for tumors" (09/28/2013)   Family History:  Family History  Problem Relation Age of Onset  . Diverticulosis Mother   . Diabetes Mother   . Hypertension Mother   . Congestive Heart Failure Mother   . Asthma Father   . CAD Sister 51       MI at age 6 per patient.  However, she has not had a stent or CABG.   . Heart disease Sister        before age 65   Family Psychiatric  History: Denies  Social History:  Social History   Substance and Sexual Activity  Alcohol Use No  . Alcohol/week: 0.0 oz     Social History   Substance and Sexual Activity  Drug Use No   Comment: 09/28/2013 "no marijuana since 2011, no crack/cocaine 1989"    Social History   Socioeconomic History  . Marital status: Divorced    Spouse  name: Not on file  . Number of children: 2  . Years of education: college  . Highest education level: Not on file  Social Needs  . Financial resource strain: Not on file  . Food insecurity - worry: Not on file  . Food insecurity - inability: Not on file  . Transportation needs - medical: Not on file  . Transportation needs - non-medical: Not on file  Occupational History  . Occupation: Disability    Comment: previously worked as a Engineer, materials  . Smoking status: Current Every Day Smoker    Packs/day: 1.00    Years: 44.00    Pack  years: 44.00    Types: Cigarettes  . Smokeless tobacco: Never Used  . Tobacco comment: 1PPD  Substance and Sexual Activity  . Alcohol use: No    Alcohol/week: 0.0 oz  . Drug use: No    Comment: 09/28/2013 "no marijuana since 2011, no crack/cocaine 1989"  . Sexual activity: Not Currently  Other Topics Concern  . Not on file  Social History Narrative   On disability. Lives with son in [place]. Formerly worked as Training and development officer.    Boyfriend passed away stage 4 cancer March 24, 2013.   S/p L BKA 2014. In wheelchair in paritially suitable apartment.    Additional Social History: She is from [place]. She has lived in [place] since she was a child. She denies alcohol use. She last smoked marijuana in 2012. She smokes 1 ppd since 57 y/o. She does not want to quit at this time.     Allergies:   Allergies  Allergen Reactions  . Abilify [Aripiprazole] Other (See Comments)    Urinary freq Nov 2016  . Iohexol      Desc: IV CONTRAST CAUSE NEPHROPATHY IN 2007   . Ivp Dye [Iodinated Diagnostic Agents]   . Morphine Sulfate Itching and Rash    Labs:  Results for orders placed or performed during the hospital encounter of 01/21/18 (from the past 48 hour(s))  Basic metabolic panel     Status: Abnormal   Collection Time: 01/21/18  4:35 PM  Result Value Ref Range   Sodium 136 135 - 145 mmol/L   Potassium 4.2 3.5 - 5.1 mmol/L   Chloride 98 (L) 101 - 111  mmol/L   CO2 27 22 - 32 mmol/L   Glucose, Bld 434 (H) 65 - 99 mg/dL   BUN 9 6 - 20 mg/dL   Creatinine, Ser 1.03 (H) 0.44 - 1.00 mg/dL   Calcium 8.5 (L) 8.9 - 10.3 mg/dL   GFR calc non Af Amer 59 (L) >60 mL/min   GFR calc Af Amer >60 >60 mL/min    Comment: (NOTE) The eGFR has been calculated using the CKD EPI equation. This calculation has not been validated in all clinical situations. eGFR's persistently <60 mL/min signify possible Chronic Kidney Disease.    Anion gap 11 5 - 15  Glucose, capillary     Status: Abnormal   Collection Time: 01/21/18  4:35 PM  Result Value Ref Range   Glucose-Capillary 418 (H) 65 - 99 mg/dL  Glucose, capillary     Status: Abnormal   Collection Time: 01/21/18  7:55 PM  Result Value Ref Range   Glucose-Capillary 294 (H) 65 - 99 mg/dL  Glucose, capillary     Status: Abnormal   Collection Time: 01/21/18  9:28 PM  Result Value Ref Range   Glucose-Capillary 268 (H) 65 - 99 mg/dL  Glucose, capillary     Status: Abnormal   Collection Time: 01/22/18  7:29 AM  Result Value Ref Range   Glucose-Capillary 290 (H) 65 - 99 mg/dL  CBC     Status: Abnormal   Collection Time: 01/22/18  9:44 AM  Result Value Ref Range   WBC 5.5 4.0 - 10.5 K/uL   RBC 3.87 3.87 - 5.11 MIL/uL   Hemoglobin 11.8 (L) 12.0 - 15.0 g/dL   HCT 37.9 36.0 - 46.0 %   MCV 97.9 78.0 - 100.0 fL   MCH 30.5 26.0 - 34.0 pg   MCHC 31.1 30.0 - 36.0 g/dL   RDW 15.4 11.5 - 15.5 %  Platelets 139 (L) 150 - 400 K/uL    Comment: Performed at [facility], [place] [address] [place], [place] [address]   *Note: Due to a large number of results and/or encounters for the requested time period, some results have not been displayed. A complete set of results can be found in Results Review.    Current Facility-Administered Medications  Medication Dose Route Frequency Provider Last Rate Last Dose  . amLODipine (NORVASC) tablet 10 mg  10 mg Oral Daily Lorella Nimrod, MD   10 mg at 01/22/18 0943  .  atenolol (TENORMIN) tablet 100 mg  100 mg Oral Daily Lorella Nimrod, MD   100 mg at 01/22/18 0943  . benazepril (LOTENSIN) tablet 40 mg  40 mg Oral Daily Lorella Nimrod, MD   40 mg at 01/22/18 0944  . buPROPion (WELLBUTRIN XL) 24 hr tablet 300 mg  300 mg Oral Daily Lorella Nimrod, MD   300 mg at 01/22/18 0943  . diphenhydrAMINE (BENADRYL) capsule 25 mg  25 mg Oral Q8H PRN Ina Homes, MD   25 mg at 01/22/18 0945  . enoxaparin (LOVENOX) injection 60 mg  60 mg Subcutaneous Q24H Lorella Nimrod, MD   60 mg at 01/21/18 2129  . famotidine (PEPCID) tablet 20 mg  20 mg Oral Daily Lorella Nimrod, MD   20 mg at 01/22/18 0944  . fluticasone (FLONASE) 50 MCG/ACT nasal spray 1 spray  1 spray Each Nare Daily Lorella Nimrod, MD   1 spray at 01/22/18 0945  . insulin aspart (novoLOG) injection 0-20 Units  0-20 Units Subcutaneous TID WC Lorella Nimrod, MD   11 Units at 01/22/18 0944  . insulin aspart (novoLOG) injection 0-5 Units  0-5 Units Subcutaneous QHS Lorella Nimrod, MD   3 Units at 01/21/18 2134  . insulin aspart (novoLOG) injection 6 Units  6 Units Subcutaneous TID WC Lorella Nimrod, MD   6 Units at 01/22/18 0944  . ipratropium-albuterol (DUONEB) 0.5-2.5 (3) MG/3ML nebulizer solution 3 mL  3 mL Nebulization Q4H PRN Lorella Nimrod, MD      . oseltamivir (TAMIFLU) capsule 75 mg  75 mg Oral BID Ina Homes, MD   75 mg at 01/22/18 0943  . oxyCODONE-acetaminophen (PERCOCET/ROXICET) 5-325 MG per tablet 1-2 tablet  1-2 tablet Oral Q4H PRN Lorella Nimrod, MD   2 tablet at 01/22/18 0602  . polyethylene glycol (MIRALAX / GLYCOLAX) packet 17 g  17 g Oral Daily PRN Lorella Nimrod, MD      . sertraline (ZOLOFT) tablet 100 mg  100 mg Oral Daily Lorella Nimrod, MD   100 mg at 01/22/18 0943  . traZODone (DESYREL) tablet 100 mg  100 mg Oral QHS PRN Lorella Nimrod, MD        Musculoskeletal: Strength & Muscle Tone: decreased due to physical deconditioning.  Gait & Station: She is able to lean on objects to transfer but is unable  to walk due to left BKA. Patient leans: N/A  Psychiatric Specialty Exam: Physical Exam  Nursing note and vitals reviewed. Constitutional: She is oriented to person, place, and time. She appears well-developed and well-nourished.  HENT:  Head: Normocephalic and atraumatic.  Neck: Normal range of motion.  Respiratory: Effort normal.  Musculoskeletal: Normal range of motion.  Neurological: She is alert and oriented to person, place, and time.  Skin: No rash noted.  Psychiatric: She has a normal mood and affect. Her speech is normal and behavior is normal. Judgment and thought content normal. Cognition and memory are normal.  Review of Systems  Constitutional: Positive for chills and fever.  Cardiovascular: Negative for chest pain.  Gastrointestinal: Negative for abdominal pain, constipation, diarrhea, nausea and vomiting.  Psychiatric/Behavioral: Positive for depression. Negative for hallucinations, substance abuse and suicidal ideas. The patient is nervous/anxious and has insomnia.     Blood pressure (!) 147/62, pulse 79, temperature 98.2 F (36.8 C), temperature source Oral, resp. rate 16, height 5' 7"  (1.702 m), weight 123.5 kg (272 lb 4.3 oz), SpO2 97 %.Body mass index is 42.64 kg/m.  General Appearance: Fairly Groomed, middle aged, obese, African American female with unbrushed hair, wearing a hospital gown and lying in bed. NAD.   Eye Contact:  Good  Speech:  Clear and Coherent and Normal Rate  Volume:  Normal  Mood:  Depressed  Affect:  Appropriate and Full Range  Thought Process:  Goal Directed and Linear  Orientation:  Full (Time, Place, and Person)  Thought Content:  Logical  Suicidal Thoughts:  No  Homicidal Thoughts:  No  Memory:  Immediate;   Good Recent;   Good Remote;   Good  Judgement:  Good  Insight:  Good  Psychomotor Activity:  Normal  Concentration:  Concentration: Good and Attention Span: Good  Recall:  Good  Fund of Knowledge:  Good  Language:  Good   Akathisia:  No  Handed:  Right  AIMS (if indicated):   N/A  Assets:  Communication Skills Desire for Improvement Financial Resources/Insurance Housing  ADL's:  Intact  Cognition:  WNL  Sleep:   Poor   Assessment: ALBENA COMES is a 57 y.o. female who was admitted with influenza. She reports depression in the setting of multiple psychosocial stressors. She denies SI but admits to a past history of SI. She does not warrant inpatient psychiatric hospitalization. She would like to establish outpatient services for mental health treatment.   Treatment Plan Summary: -Continue Trazodone 100 mg qhs PRN for sleep.  -Patient reports discontinuing Wellbutrin and Zoloft a month ago due to side effects (hallucinations). Would discontinue these medications although it is unclear that her VH were unrelated to use of these medications. -Patient has a history of medication resistance and should establish care with an outpatient psychiatrist to discuss non medication options such as TMS and ECT. -Please have unit SW provide patient with resources for local therapists and psychiatrists.  -Patient is psychiatrically cleared. Psychiatry will sign off on patient at this time. Please consult psychiatry again as needed.   Disposition: No evidence of imminent risk to self or others at present.   Patient does not meet criteria for psychiatric inpatient admission.  Faythe Dingwall, DO 01/22/2018 10:54 AM

## 2018-01-22 NOTE — Progress Notes (Signed)
Inpatient Diabetes Program Recommendations  AACE/ADA: New Consensus Statement on Inpatient Glycemic Control (2015)  Target Ranges:  Prepandial:   less than 140 mg/dL      Peak postprandial:   less than 180 mg/dL (1-2 hours)      Critically ill patients:  140 - 180 mg/dL   Results for Jennifer [name], Jennifer [name] (MRN [id number]) as of 01/22/2018 14:05  Ref. Range 01/21/2018 11:52 01/21/2018 16:35 01/21/2018 19:55  Glucose-Capillary Latest Ref Range: 65 - 99 mg/dL 271 (H) 418 (H) 294 (H)   Results for [name], [name] (MRN [id number]) as of 01/22/2018 14:05  Ref. Range 01/22/2018 07:29 01/22/2018 13:23  Glucose-Capillary Latest Ref Range: 65 - 99 mg/dL 290 (H)  17 units Novolog 236 (H)  13 units Novolog    Admit with: Suicidal Ideation  History: DM  Home DM Meds: Humalog 75/25 Insulin- 140 units AM/ 85 units PM       Metformin 500 mg daily  Current Insulin Orders: Novolog Resistant Correction Scale/ SSI (0-20 units) TID AC + HS      Novolog 6 units TID      Patient sees [facility] [name] with [facility].  Suffering from severe depression.  Takes Humalog 75/25 Insulin at home.  CBGs elevated in hospital.  Likely needs to restart a portion of her home insulin.    MD- Please consider starting Novolog 70/30 Insulin- 35 units with Breakfast and 20 units with Dinner   (this would be about 25% total home dose)  If you restart 70/30 Insulin, please Stop Novolog 6 units TID with meals but continue Novolog Resistant SSI      --Will follow patient during hospitalization--  Wyn Quaker RN, MSN, CDE Diabetes Coordinator Inpatient Glycemic Control Team Team Pager: [phone] (8a-5p)
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
## 2018-01-23 ENCOUNTER — Other Ambulatory Visit: Payer: Self-pay

## 2018-01-23 ENCOUNTER — Encounter (HOSPITAL_COMMUNITY): Payer: Self-pay | Admitting: *Deleted

## 2018-01-23 DIAGNOSIS — M797 Fibromyalgia: Secondary | ICD-10-CM | POA: Diagnosis not present

## 2018-01-23 DIAGNOSIS — F341 Dysthymic disorder: Secondary | ICD-10-CM | POA: Diagnosis not present

## 2018-01-23 LAB — GLUCOSE, CAPILLARY
Glucose-Capillary: 232 mg/dL — ABNORMAL HIGH (ref 65–99)
Glucose-Capillary: 223 mg/dL — ABNORMAL HIGH (ref 65–99)
Glucose-Capillary: 290 mg/dL — ABNORMAL HIGH (ref 65–99)
Glucose-Capillary: 508 mg/dL (ref 65–99)

## 2018-01-23 MED ORDER — IPRATROPIUM-ALBUTEROL 0.5-2.5 (3) MG/3ML IN SOLN
3.0000 mL | Freq: Three times a day (TID) | RESPIRATORY_TRACT | Status: DC
  Administered 2018-01-23: 3 mL via RESPIRATORY_TRACT
  Filled 2018-01-23: qty 3

## 2018-01-23 MED ORDER — OXYCODONE-ACETAMINOPHEN 5-325 MG PO TABS: 1.0000 | ORAL_TABLET | ORAL | 0 refills | Status: DC | PRN

## 2018-01-23 MED ORDER — HYDROXYZINE HCL 25 MG PO TABS: 25.0000 mg | ORAL_TABLET | Freq: Three times a day (TID) | ORAL | 0 refills | Status: DC | PRN

## 2018-01-23 MED ORDER — DOXYCYCLINE HYCLATE 100 MG PO TABS: 100.0000 mg | ORAL_TABLET | Freq: Two times a day (BID) | ORAL | 0 refills | Status: DC

## 2018-01-23 MED ORDER — SERTRALINE HCL 50 MG PO TABS: 150.0000 mg | ORAL_TABLET | Freq: Every day | ORAL | 0 refills | Status: DC

## 2018-01-23 MED ORDER — FLUCONAZOLE 150 MG PO TABS: 150.0000 mg | ORAL_TABLET | ORAL | 0 refills | Status: DC

## 2018-01-23 MED ORDER — OSELTAMIVIR PHOSPHATE 75 MG PO CAPS: 75.0000 mg | ORAL_CAPSULE | Freq: Two times a day (BID) | ORAL | 0 refills | Status: DC

## 2018-01-23 NOTE — Progress Notes (Signed)
This Probation officer and Sch[place] went into the patient room and provided patient with shelter list.  CSW explained that we are unable to provide patient with any other type of housing. Patient's daughter in law in the room said she needs until 8:30pm tonight to see if patient can get housing and that she can't come to her place because patient's adult sons are also with her. Patient was staying with her sister prior to admission and now family trying to call the shelters.  Called the Internal medicine doctor number couple of times to make the situation aware, waiting for them to call back.

## 2018-01-23 NOTE — Discharge Summary (Signed)
Name: Jennifer [name] MRN: [id number] DOB: June 06, 1961 57 y.o. PCP: Jennifer Crews, MD  Date of Admission: 01/21/2018  3:38 PM Date of Discharge:  Attending Physician: Jennifer Linsey, MD  Discharge Diagnosis: 1.  Major depressive disorder. 2.  Influenza A  Principal Problem:   Persistent depressive disorder Active Problems:   Influenza A   Severe major depression without psychotic features ([place])   Homelessness   Discharge Medications: Allergies as of 01/23/2018      Reactions   Abilify [aripiprazole] Other (See Comments)   Urinary freq Nov 2016   Iohexol     Desc: IV CONTRAST CAUSE NEPHROPATHY IN 2007   Ivp Dye [iodinated Diagnostic Agents]    Morphine Sulfate Itching, Rash      Medication List    STOP taking these medications   nystatin powder Commonly known as:  MYCOSTATIN/NYSTOP     TAKE these medications   ADVAIR DISKUS 250-50 MCG/DOSE Aepb Generic drug:  Fluticasone-Salmeterol TAKE 1 INHALATION BY MOUTH TWICE DAILY   albuterol (2.5 MG/3ML) 0.083% nebulizer solution Commonly known as:  PROVENTIL INHALE THE CONTENTS OF 1 VIAL VIA NEBULIZER EVERY 6 HOURS AS NEEDED FOR WHEEZING What changed:  Another medication with the same name was removed. Continue taking this medication, and follow the directions you see here.   amLODipine 10 MG tablet Commonly known as:  NORVASC TAKE 1 TABLET BY MOUTH EVERY DAY What changed:    how much to take  how to take this  when to take this   atenolol 100 MG tablet Commonly known as:  TENORMIN TAKE 1 TABLET BY MOUTH EVERY DAY What changed:    how much to take  how to take this  when to take this   baclofen 10 MG tablet Commonly known as:  LIORESAL TAKE 1 TABLET BY MOUTH THREE TIMES A DAY AS NEEDED FOR MUSCLE SPASMS What changed:  See the new instructions.   benazepril 40 MG tablet Commonly known as:  LOTENSIN TAKE 1 TABLET BY MOUTH ONCE DAILY What changed:    how much to take  how to take  this  when to take this   buPROPion 300 MG 24 hr tablet Commonly known as:  WELLBUTRIN XL TAKE 1 TABLET BY MOUTH ONCE DAILY What changed:    how much to take  how to take this  when to take this   cholecalciferol 1000 units tablet Commonly known as:  VITAMIN D Take 1,000 Units by mouth daily.   doxycycline 100 MG tablet Commonly known as:  VIBRA-TABS Take 1 tablet (100 mg total) by mouth every 12 (twelve) hours. For 6 more days.   fluconazole 150 MG tablet Commonly known as:  DIFLUCAN Take 1 tablet (150 mg total) by mouth once a week. For 3 more weeks. Start taking on:  01/29/2018   fluticasone 50 MCG/ACT nasal spray Commonly known as:  FLONASE INSTILL 1 SPRAY IN EACH NOSTRIL DAILY   furosemide 40 MG tablet Commonly known as:  LASIX TAKE 1 TABLET BY MOUTH DAILY AS NEEDED What changed:    how much to take  how to take this  when to take this   hydrochlorothiazide 25 MG tablet Commonly known as:  HYDRODIURIL TAKE 1 TABLET BY MOUTH EVERY DAY What changed:    how much to take  how to take this  when to take this   hydrOXYzine 25 MG tablet Commonly known as:  ATARAX/VISTARIL Take 1 tablet (25 mg total) by mouth 3 (three) times  daily as needed for itching.   Insulin Lispro Prot & Lispro (75-25) 100 UNIT/ML Kwikpen Commonly known as:  HUMALOG MIX 75/25 KWIKPEN Inject 130 Units into the skin daily with breakfast AND 85 Units daily before supper. INJECT 130 UNITS SUBCUTANEOUSLY EVERY MORNING BEFORE BREAKFAST AND 85 UNITS BEFORE SUPPER. What changed:  See the new instructions.   ipratropium 0.02 % nebulizer solution Commonly known as:  ATROVENT USE 1 VIAL VIA NEBULIZER EVERY 6 HOURS AS NEEDED FOR WHEEZING   LYRICA 200 MG capsule Generic drug:  pregabalin TAKE 1 CAPSULE BY MOUTH THREE TIMES A DAY What changed:  See the new instructions.   metFORMIN 500 MG 24 hr tablet Commonly known as:  GLUCOPHAGE-XR TAKE 1 TABLET BY MOUTH EVERY DAY WITH BREAKFAST What  changed:  See the new instructions.   oseltamivir 75 MG capsule Commonly known as:  TAMIFLU Take 1 capsule (75 mg total) by mouth 2 (two) times daily.   oxyCODONE-acetaminophen 5-325 MG tablet Commonly known as:  PERCOCET/ROXICET Take 1-2 tablets by mouth every 4 (four) hours as needed for moderate pain or severe pain. What changed:  reasons to take this   potassium chloride SA 20 MEQ tablet Commonly known as:  K-DUR,KLOR-CON TAKE 2 TABLETS BY MOUTH DAILY   ranitidine 150 MG tablet Commonly known as:  ZANTAC TAKE 1 TABLET BY MOUTH TWICE DAILY What changed:    how much to take  how to take this  when to take this   rosuvastatin 20 MG tablet Commonly known as:  CRESTOR TAKE 1 TABLET BY MOUTH AT BEDTIME What changed:    how much to take  how to take this  when to take this   sertraline 50 MG tablet Commonly known as:  ZOLOFT Take 3 tablets (150 mg total) by mouth daily. Start taking on:  01/24/2018 What changed:    medication strength  how much to take   traZODone 100 MG tablet Commonly known as:  DESYREL TABLET BY MOUTH DAILY AT BEDTIME AS NEEDED FOR SLEEP   vitamin B-12 100 MCG tablet Commonly known as:  CYANOCOBALAMIN Take 100 mcg by mouth daily.   vitamin C 500 MG tablet Commonly known as:  ASCORBIC ACID Take 500 mg by mouth daily.       Disposition and follow-up:   [name] was discharged from [facility] in Good condition.  At the hospital follow up visit please address:  1.  Her mood and living condition. 2.  Resolution of her flu symptoms. 3.  Completion of antibiotic for her widespread folliculitis and Candida intertrigo.  2.  Labs / imaging needed at time of follow-up: None  3.  Pending labs/ test needing follow-up: None  Follow-up Appointments: Please call clinic on Monday morning to make an appointment with Dr. [name] to be seen within 1 week.  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  MDD.  Initially she do endorse some suicidal ideations, next day she stating that she will never hurts herself because of her kids.  Psychiatry was consulted who cleared her to be discharged home with a outpatient follow-up with psychiatry.  As she was not responding well to multiple antidepressants they will evaluate her for a possible ECT as an outpatient.  Currently she was doing well on Wellbutrin and Zoloft, her Zoloft dose  was increased to 150 mg daily and she was discharged on her home dose of Wellbutrin. She will follow-up closely with her PCP. She was provided with information for outpatient psychiatry. She needs to be worked on for her accommodation, as her sister refused to take her back home.  She was provided with discounted housing information's and she will make calls to find a place for herself.  She was told that if she was unable to find anything can tell her PCP and we will try to help her through clinic.  Influenza A: Influenza PCR was checked in the clinic because of her complaint of generalized malaise and cough and found to be positive for influenza A.  She was given a 5-day course of Tamiflu.  Folliculitis: She was complaining of a chronic rash on all of her extremities which appears like a folliculitis, because of wide area she was given a 7-day course of doxycycline.  Candidal Intertrigo of Abdominal Panus: She was started on fluconazole 150 mg q. weekly for 4 weeks because of persistent of rash despite being treated multiple times with nystatin powder.  Uncontrolled DM:A1c 9.4, CBG remained between 133-233 over the last 24-hour. -Can resume her home regimen on discharge.  Hypertension.  Blood pressure elevated this morning. -She was advised to resume her home meds which include hydrochlorothiazide 25  mg daily which was initially held.  Discharge Vitals:   BP (!) 135/51 (BP Location: Right Arm)   Pulse 64   Temp 98.2 F (36.8 C) (Oral)   Resp 16   Ht 5\' 7"  (1.702 m)   Wt 272 lb 4.3 oz (123.5 kg)   SpO2 94%   BMI 42.64 kg/m   Gen. Well-developed, well-nourished lady, in no acute distress. Lungs.  Few scattered rhonchi bilaterally. CVS.  Regular rate and rhythm. Abdomen.  Soft, nontender, bowel sounds positive. Extremities. LLE BKA, no edema, pulses intact on right lower extremity. Psych.  Normal mood and affect today.  Pertinent Labs, Studies, and Procedures:  CBC    Component Value Date/Time   WBC 5.5 01/22/2018 0944   RBC 3.87 01/22/2018 0944   HGB 11.8 (L) 01/22/2018 0944   HCT 37.9 01/22/2018 0944   PLT 139 (L) 01/22/2018 0944   MCV 97.9 01/22/2018 0944   MCH 30.5 01/22/2018 0944   MCHC 31.1 01/22/2018 0944   RDW 15.4 01/22/2018 0944   LYMPHSABS 5.3 (H) 01/14/2017 2131   MONOABS 0.5 01/14/2017 2131   EOSABS 0.2 01/14/2017 2131   BASOSABS 0.0 01/14/2017 2131   Influenza A positive.  Discharge Instructions: Discharge Instructions    Diet - low sodium heart healthy   Complete by:  As directed    Discharge instructions   Complete by:  As directed    It was pleasure taking care of you. Please call the clinic on Monday morning and make an appointment with Dr. [name] to be seen within the next few days. Please take all your medicines as directed. Please keep yourself well-hydrated and finish all the antibiotics. As you were provided with some information for housing by our social worker, please contact those numbers to find a good accommodation for you.  If you experience any problem please let us know on your next follow-up visit.   Increase activity slowly   Complete by:  As directed       Signed: Lorella Nimrod, MD 01/23/2018, 6:03 PM   Pager: [phone]
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
## 2018-01-23 NOTE — Progress Notes (Signed)
   Subjective: Patient was feeling better when seen this morning.  She continued to feel some malaise and cough.  She denies any suicidal ideations.  She denies any hallucinations.  She wants to go back to her sister's home instead of a shelter and then look for a place for herself.  Objective:  Vital signs in last 24 hours: Vitals:   01/22/18 2122 01/22/18 2148 01/23/18 0500 01/23/18 0900  BP: (!) 151/56  (!) 150/65 (!) 135/51  Pulse: 73  (!) 53 64  Resp: 16  16 16   Temp: 98.4 F (36.9 C)  97.6 F (36.4 C) 98.2 F (36.8 C)  TempSrc: Oral  Oral Oral  SpO2: 96% 96% 95% 95%  Weight:      Height:       Gen. Well-developed, well-nourished lady, in no acute distress. Lungs.  Few scattered rhonchi bilaterally. CVS.  Regular rate and rhythm. Abdomen.  Soft, nontender, bowel sounds positive. Extremities. LLE BKA, no edema, pulses intact on right lower extremity. Psych.  Normal mood and affect today.  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  Psych has been cleared her for discharge and she will follow-up with them as an outpatient.  She denies any hallucination with Wellbutrin and Zoloft.  It seems that these are helping with her depression.  She was optimistic about the future today and will call to find out a reasonable accommodation for herself and her kids.  She would like to go back to her sister's place instead of a shelter at this time. -Continue Wellbutrin and Zoloft at current dosage. -She was provided with information about outpatient psych. -She was also provided with information for a discounted housing.  Patient knows that she has to make calls herself to find a reasonable place. -She will need a close follow-up with Dr. [name].  Influenza A:  Continue to have mild malaise  which was anticipated.  Remained afebrile. -Continue Tamiflu for another 3 days to complete a 5-day course. -Continue supportive care. -She can resume her inhalers as needed on discharge.  Folliculitis: Widespread folliculitis with associated itching and mild pain with pressure. - Doxycycline 100mg  BID for 7 days  Candidal Intertrigo of Abdominal Panus: She was started on fluconazole 150 mg q. weekly for 4 weeks because of persistent of rash despite being treated multiple times with nystatin powder.  Uncontrolled DM: A1c 9.4, CBG remained between 133-233 over the last 24-hour. -Can resume her home regimen on discharge.  Hypertension.  Blood pressure elevated this morning. -She was advised to resume her home meds which include hydrochlorothiazide 25 mg daily which was initially held.  Dispo: Being discharged today.  Lorella Nimrod, MD 01/23/2018, 1:43 PM Pager: [phone]

## 2018-01-23 NOTE — Progress Notes (Signed)
Oncoming nurse made aware of the situation regarding the discharge.

## 2018-01-28 ENCOUNTER — Ambulatory Visit (INDEPENDENT_AMBULATORY_CARE_PROVIDER_SITE_OTHER): Payer: Medicare Other | Admitting: Internal Medicine

## 2018-01-28 VITALS — BP 153/88 | HR 64 | Temp 98.2°F

## 2018-01-28 DIAGNOSIS — Z79891 Long term (current) use of opiate analgesic: Secondary | ICD-10-CM

## 2018-01-28 DIAGNOSIS — F332 Major depressive disorder, recurrent severe without psychotic features: Secondary | ICD-10-CM | POA: Diagnosis not present

## 2018-01-28 MED ORDER — OXYCODONE-ACETAMINOPHEN 5-325 MG PO TABS: 1.0000 | ORAL_TABLET | ORAL | 0 refills | Status: DC | PRN

## 2018-01-28 NOTE — Assessment & Plan Note (Signed)
This problem is chronic and uncontrolled. Her PEG is 30 like it was last week. At one point, she had been receiving 360 oxycodone 5 mg daily for a MME of 90. We did a slow 10% per month taper until June 2018 when she ended up on 222 oxycodone 5 mg for about 56 MME. I stopped the taper in June due to worsening of her psychosocial economical situation. I have been concerned that she is not getting any benefit in regards to pain relief or function improvement from her opioid but is getting all of the risk. She has never had any red flag or orange flag behavior. Her UDS and [employer] narcotic database are always appropriate.  Her mental health issues are not under control. Last week when she was hospitalized she was evaluated by inpatient psychiatry who recommended outpatient counseling and outpatient psychiatry. She states no one set her up an appointment and therefore she has not gone to either of these or arranged appointments. I discussed that over the past 5 years while her doctor, I have not made any progress in treating her chronic medical issues including diabetes, hypertension, pain, OSA, tobacco use, depression, COPD. We both agree that part of this is due to uncontrolled mental health issues. She has never agreed to go to counseling or psychiatry in the past as she did not have the co-pay for these appointments.  Prior to the appointment today, I had discussed treatment options with a clinical psychologist, our clinical pharmacist, and one of our physicians who prescribes Suboxone. I have come up with the following plan and stress to her that this is not punishment but rather to get her mental conditions under control which will then allow me to get her medical issues under control. We also discussed that some patients on opioids have paradoxical worsening of the pain and function and not with an opioid taper, the pain improves and her function improves.  I have given her 2 weeks to find a   psychiatrist and a Social worker. I appreciate that she is concerned about her insurance co-pay. I gave her information about community resources so that she could call various places to see about payment issues. I also encouraged her to contact various churches and organizations in the area as they also offered counseling on a reduced fee. She states she does not have some available for her use. We offered her the clinic phone to start making the phone calls. I am also going to see if [facility] will reengage her for the specific goal of connecting her with mental health services.   I also discussed that I am resuming her opioid taper at 10% per week. I obtained a COWS score today to see what her score would be before the taper is started. Her score is 6. I laid out the details of her taper in her AVS. I sent in her prescription for 173 oxycodone which will be a 28 day supply. We will be seeing her back weekly, alternating between Dr. Maudie Mercury and myself, to ensure she is tolerating the taper. If she is able to comply with the mental health recommendations but does not tolerate the taper, options would be to convert her to Suboxone or refer her to pain therapy. If she is unable to comply with the need for mental health services, I do not feel that she would be appropriate for Suboxone therapy at least in our clinic. The Suboxone provider agreed with this. In this case, she will  need to be referred to addition therapy as I do not feel we have the resources her expertise to deal with chronic and uncontrolled pain and chronic uncontrolled mental health issues. If she does not comply with the mental health recommendations, I will need to decide whether to keep her in our clinic or dismiss her.  Ms Bussie accepted my recommendations without argument. She stated her poor medical condition control is due to her eating and not my services. She stated she did not want me to leave as her physician. I stated that we needed to work  together to get her conditions under control. I am concerned about her poor long-term control and that at this point, our doctor-patient relationship may not be therapeutic.   PLAN : 2 weeks to see a counselor and a psychiatrist Opioid taper, 10% per week laid out in her AVS Return to clinic February 14 11 AM Dr. Maudie Mercury Return to clinic February 20 11 AM Dr. [name]

## 2018-01-28 NOTE — Assessment & Plan Note (Signed)
Please see chronic prescription opioid use
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
## 2018-01-28 NOTE — Patient Instructions (Addendum)
1. See a psychiatrist and a counselor in the next two weeks.  2. Your Percocet is not helping you function and may be worsening your pain. I am resuming a taper as follows. a. Feb 7th - 13th = 50 pills or about 7 pills per day b. Feb 14th - 20th = 45 pills or about 6.5 pills per day c. Feb 21st - 27th = 41 pills or about 5.5 pills per day d. Feb 28th - March 6th = 37 pills or about 5 pills per day 3. If you run out of pain pills early, you will not be given an early refill. I will treat symptoms of withdrawal with other medicines. 4. You will need to come to the clinic every week so we may ensure you are tolerating the taper.    Feb 14th 11 AM Dr Maudie Mercury Feb 20th 11AM Dr [name]

## 2018-02-04 ENCOUNTER — Ambulatory Visit (INDEPENDENT_AMBULATORY_CARE_PROVIDER_SITE_OTHER): Payer: Medicare Other | Admitting: Pharmacist

## 2018-02-04 DIAGNOSIS — F1120 Opioid dependence, uncomplicated: Secondary | ICD-10-CM

## 2018-02-04 NOTE — Progress Notes (Signed)
S: Jennifer [name] is a 57 y.o. female reports to clinical pharmacist appointment for opioid taper and pain. Patient did not bring medication bottles.  Allergies  Allergen Reactions  . Abilify [Aripiprazole] Other (See Comments)    Urinary freq Nov 2016  . Iohexol      Desc: IV CONTRAST CAUSE NEPHROPATHY IN 2007   . Ivp Dye [Iodinated Diagnostic Agents]   . Morphine Sulfate Itching and Rash   Medication Sig  ADVAIR DISKUS 250-50 MCG/DOSE AEPB TAKE 1 INHALATION BY MOUTH TWICE DAILY  albuterol (PROVENTIL) (2.5 MG/3ML) 0.083% nebulizer solution INHALE THE CONTENTS OF 1 VIAL VIA NEBULIZER EVERY 6 HOURS AS NEEDED FOR WHEEZING  amLODipine (NORVASC) 10 MG tablet TAKE 1 TABLET BY MOUTH EVERY DAY Patient taking differently: TAKE 10 mg  TABLET BY MOUTH EVERY DAY  atenolol (TENORMIN) 100 MG tablet TAKE 1 TABLET BY MOUTH EVERY DAY Patient taking differently: TAKE 100 mg TABLET BY MOUTH EVERY DAY  baclofen (LIORESAL) 10 MG tablet TAKE 1 TABLET BY MOUTH THREE TIMES A DAY AS NEEDED FOR MUSCLE SPASMS Patient taking differently: TAKE 10 mg TABLET BY MOUTH THREE TIMES A DAY AS NEEDED FOR MUSCLE SPASMS  benazepril (LOTENSIN) 40 MG tablet TAKE 1 TABLET BY MOUTH ONCE DAILY Patient taking differently: TAKE 40 TABLET BY MOUTH ONCE DAILY  buPROPion (WELLBUTRIN XL) 300 MG 24 hr tablet TAKE 1 TABLET BY MOUTH ONCE DAILY Patient taking differently: TAKE 300 mg TABLET BY MOUTH ONCE DAILY  cholecalciferol (VITAMIN D) 1000 units tablet Take 1,000 Units by mouth daily.  doxycycline (VIBRA-TABS) 100 MG tablet Take 1 tablet (100 mg total) by mouth every 12 (twelve) hours. For 6 more days.  fluconazole (DIFLUCAN) 150 MG tablet Take 1 tablet (150 mg total) by mouth once a week. For 3 more weeks.  fluticasone (FLONASE) 50 MCG/ACT nasal spray INSTILL 1 SPRAY IN EACH NOSTRIL DAILY  furosemide (LASIX) 40 MG tablet TAKE 1 TABLET BY MOUTH DAILY AS NEEDED Patient taking differently: TAKE 40 mg  TABLET BY MOUTH DAILY AS NEEDED   hydrochlorothiazide (HYDRODIURIL) 25 MG tablet TAKE 1 TABLET BY MOUTH EVERY DAY Patient taking differently: TAKE 25 mg TABLET BY MOUTH EVERY DAY  hydrOXYzine (ATARAX/VISTARIL) 25 MG tablet Take 1 tablet (25 mg total) by mouth 3 (three) times daily as needed for itching.  Insulin Lispro Prot & Lispro (HUMALOG MIX 75/25 KWIKPEN) (75-25) 100 UNIT/ML Kwikpen Inject 130 Units into the skin daily with breakfast AND 85 Units daily before supper. INJECT 130 UNITS SUBCUTANEOUSLY EVERY MORNING BEFORE BREAKFAST AND 85 UNITS BEFORE SUPPER. Patient taking differently: Inject 130 Units into the skin daily with breakfast AND 85 Units daily before supper. INJECT 140 UNITS SUBCUTANEOUSLY EVERY MORNING BEFORE BREAKFAST AND 85 UNITS BEFORE SUPPER.  ipratropium (ATROVENT) 0.02 % nebulizer solution USE 1 VIAL VIA NEBULIZER EVERY 6 HOURS AS NEEDED FOR WHEEZING  LYRICA 200 MG capsule TAKE 1 CAPSULE BY MOUTH THREE TIMES A DAY Patient taking differently: TAKE 200 mg CAPSULE BY MOUTH THREE TIMES A DAY  metFORMIN (GLUCOPHAGE-XR) 500 MG 24 hr tablet TAKE 1 TABLET BY MOUTH EVERY DAY WITH BREAKFAST Patient taking differently: TAKE 500 mg  TABLET BY MOUTH EVERY DAY WITH BREAKFAST  oseltamivir (TAMIFLU) 75 MG capsule Take 1 capsule (75 mg total) by mouth 2 (two) times daily.  oxyCODONE-acetaminophen (PERCOCET/ROXICET) 5-325 MG tablet Take 1-2 tablets by mouth every 4 (four) hours as needed for moderate pain or severe pain. This is a 28 day supply  potassium chloride SA (K-DUR,KLOR-CON) 20 MEQ  tablet TAKE 2 TABLETS BY MOUTH DAILY  ranitidine (ZANTAC) 150 MG tablet TAKE 1 TABLET BY MOUTH TWICE DAILY Patient taking differently: TAKE 150 mg TABLET BY MOUTH TWICE DAILY  rosuvastatin (CRESTOR) 20 MG tablet TAKE 1 TABLET BY MOUTH AT BEDTIME Patient taking differently: TAKE 20 mg TABLET BY MOUTH AT BEDTIME  sertraline (ZOLOFT) 50 MG tablet Take 3 tablets (150 mg total) by mouth daily.  traZODone (DESYREL) 100 MG tablet TABLET BY MOUTH  DAILY AT BEDTIME AS NEEDED FOR SLEEP  vitamin B-12 (CYANOCOBALAMIN) 100 MCG tablet Take 100 mcg by mouth daily.  vitamin C (ASCORBIC ACID) 500 MG tablet Take 500 mg by mouth daily.  Potassium Chloride ER 20 MEQ TBCR Take 40 mEq by mouth daily.   Past Medical History:  Diagnosis Date  . Anginal pain [facility])    '3' of 10 ischemia ruled out 9/9   . Arthritis of lumbar spine   . Asthma   . CHF (congestive heart failure) ([place])   . 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 Chronic bronchitis ([place])    "I get it alot" (09/28/2013)  . Chronic diastolic heart failure (HCC)    grade 2 per 2D echocardiogram (01/2013)  . Chronic lower back pain   . Chronic osteomyelitis of foot (HCC)    chronic, right secondary to diabetic foot ulcers  . Chronic pain syndrome 12/03/2011   Likely secondary to depression, "fibromyalgia", neuropathy, and obesity. Lumbar MRI 2014 no sig change from prior (2008) : Stable hypertrophic facet disease most notable at L4-5. Stable shallow left foraminal/extraforaminal disc protrusion at L4-5. No direct neural compression.      Marland Kitchen COPD 01/08/2007   PFT's 05/2007 : FEV1/FVC 82, FEV1 64% pred, FEF 25-75% 40% predicted, 16% improvement in FEV1 with bronchodilators.     . Depression   . Diabetic peripheral neuropathy ([place])   . DVT of upper extremity (deep vein thrombosis) ([place]) 03/11/2013   Secondary to PICC line. Right brachial vein, diagnosed on 03/10/2013 Coumadin for 3 months. End date 06/10/2013   . Environmental allergies    Hx: of  . Exertional shortness of breath   . Fatty liver 2003   observed on ultrasound abdomen  . Fibromyalgia   . GERD (gastroesophageal reflux disease)   . Glaucoma   . Hyperlipidemia   . Hyperplastic colon polyp 12/2010   Per colonoscopy (12/2010) - Dr. Deatra Ina  . Hypertension   . 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 Blood cultures 01/26/13 S. Aureus and GBS. Blood cultures  Feb 6th, 8th, and 9th and March were negative.Repeat TEE 3/20 negative for vegitations  . 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 2/2 tobacco use, DM, and cont weight bearing on surgical wound and developed gangrene   . Polymicrobial bacterial infection 01/2013   GBS and S. aureus bacteremia // Source likely infected diabetic foot ulcer  . PVD (peripheral vascular disease) with claudication ([place])    Stents to bilateral common iliac arteries (left 2005, right 2008), on chronic plavix  . Rheumatoid arthritis ([place])   . Tobacco abuse   . Type II diabetes mellitus with peripheral circulatory disorders, uncontrolled DX: 1993   Insulin dep. Poor control. Complicated by diabetic foot ulcer and diabetic eye disease.    Marland Kitchen Ulcer of foot, chronic (HCC)    Left. No OM per MRI (01/2013)   Social History  Socioeconomic History  . Marital status: Divorced    Spouse name: Not on file  . Number of children: 2  . Years of education: college  . Highest education level: Not on file  Social Needs  . Financial resource strain: Not on file  . Food insecurity - worry: Not on file  . Food insecurity - inability: Not on file  . Transportation needs - medical: Not on file  . Transportation needs - non-medical: Not on file  Occupational History  . Occupation: Disability    Comment: previously worked as a Engineer, materials  . Smoking status: Current Every Day Smoker    Packs/day: 1.00    Years: 44.00    Pack years: 44.00    Types: Cigarettes  . Smokeless tobacco: Never Used  . Tobacco comment: 1PPD  Substance and Sexual Activity  . Alcohol use: No    Alcohol/week: 0.0 oz  . Drug use: No    Comment: 09/28/2013 "no marijuana since 04/17/10, no crack/cocaine 1989"  . Sexual activity: Not Currently  Other Topics Concern  . Not on file  Social History Narrative   On disability. Lives with son in [place]. Formerly worked as Training and development officer.     Boyfriend passed away stage 4 cancer 2013/04/17.   S/p L BKA 04-17-2013. In wheelchair in paritially suitable apartment.    Family History  Problem Relation Age of Onset  . Diverticulosis Mother   . Diabetes Mother   . Hypertension Mother   . Congestive Heart Failure Mother   . Asthma Father   . CAD Sister 21       MI at age 58 per patient.  However, she has not had a stent or CABG.   . Heart disease Sister        before age 4   O: Component Value Date/Time   CHOL 139 06/05/2016 1118   HDL 34 (L) 06/05/2016 1118   TRIG 142 06/05/2016 1118   AST 35 01/14/2017 2131   ALT 34 01/14/2017 2131   NA 136 01/21/2018 1635   NA 144 11/07/2016 1125   K 4.2 01/21/2018 1635   CL 98 (L) 01/21/2018 1635   CO2 27 01/21/2018 1635   GLUCOSE 434 (H) 01/21/2018 1635   HGBA1C 9.4 01/21/2018 1248   HGBA1C 8.5 (H) 11/07/2016 1125   BUN 9 01/21/2018 1635   BUN 15 11/07/2016 1125   CREATININE 1.03 (H) 01/21/2018 1635   CREATININE 0.68 01/31/2015 1641   CALCIUM 8.5 (L) 01/21/2018 1635   GFRNONAA 59 (L) 01/21/2018 1635   GFRNONAA >89 01/31/2015 1641   GFRAA >60 01/21/2018 1635   GFRAA >89 01/31/2015 1641   WBC 5.5 01/22/2018 0944   HGB 11.8 (L) 01/22/2018 0944   HCT 37.9 01/22/2018 0944   PLT 139 (L) 01/22/2018 0944   TSH 1.516 01/07/2017 0151   TSH 1.120 11/07/2016 1125   Ht Readings from Last 2 Encounters:  01/21/18 5\' 7"  (1.702 m)  01/21/18 5\' 7"  (1.702 m)   Wt Readings from Last 2 Encounters:  01/21/18 272 lb 4.3 oz (123.5 kg)  12/18/17 281 lb 4.8 oz (127.6 kg)   There is no height or weight on file to calculate BMI. BP Readings from Last 3 Encounters:  01/28/18 (!) 153/88  01/23/18 (!) 135/51  01/21/18 (!) 159/62   A/P:  COWS score of 6 indicating mild symptoms, notable changes since last visit on 01/28/18  No GI symptoms  No yawning or anxiety  Patient reports piloerection  of skin  PEG score 26 indicating severe pain/interference, minor improvement since 01/28/17 (score of  30)  Includes neuropathy, osteoarthritis, and muscle spasm (patient states baclofen did not help)  Patient does not recall how many oxycodone-acetaminophen tablets she takes daily. When asked to give an estimate, she stated she does not know.  Recommendations  Advised patient to keep a log of how much oxycodone-acetaminophen she takes daily. This is important for [place] to provide the best care.  Discussed with patient potential for opioid hyperalgesia  Recommended OTC trolamine salicylate or Rx diclofenac gel  Possible future options to consider  Continue OTC and non-pharmacologic pain methods (heat for muscle spasm, TENS)  Switch from sertraline to duloxetine, patient has tried duloxetine in the past for MDD, 30 mg daily. If tolerated/feasible, can try titrating for MDD, neropathic pain, and some evidence supporting efficacy in osteoarthritis.  Can try switching from baclofen to other antispasmodic, but potential side effects may outweigh benefits (e.g. dizziness, drowsiness, hypo-/hypertension)  An after visit summary was provided and patient advised to follow up next week, or if any changes in condition or questions regarding medications arise.   The patient verbalized understanding of information provided by repeating back concepts discussed.   30 minutes spent face-to-face with the patient during the encounter. 80% of time spent on education. 20% of time was spent on assessment, plan, and coordination of care.

## 2018-02-04 NOTE — Patient Instructions (Addendum)
It was great meeting you today! Please keep track of how much Percocet you take each day. Reduce to 6.5 tablets daily.   Try Aspercreme for arthritis pain. I will discuss our thoughts with Dr. [name]. Feel free to call me if you have any questions.  Please contact therapy services as discussed and follow up next week with Dr. [name].

## 2018-02-10 ENCOUNTER — Other Ambulatory Visit: Payer: Self-pay

## 2018-02-10 ENCOUNTER — Ambulatory Visit (INDEPENDENT_AMBULATORY_CARE_PROVIDER_SITE_OTHER): Payer: Medicare Other | Admitting: Internal Medicine

## 2018-02-10 DIAGNOSIS — H5789 Other specified disorders of eye and adnexa: Secondary | ICD-10-CM | POA: Diagnosis not present

## 2018-02-10 DIAGNOSIS — Z89519 Acquired absence of unspecified leg below knee: Secondary | ICD-10-CM

## 2018-02-10 DIAGNOSIS — E1159 Type 2 diabetes mellitus with other circulatory complications: Secondary | ICD-10-CM | POA: Diagnosis not present

## 2018-02-10 DIAGNOSIS — M2550 Pain in unspecified joint: Secondary | ICD-10-CM

## 2018-02-10 DIAGNOSIS — G8929 Other chronic pain: Secondary | ICD-10-CM

## 2018-02-10 DIAGNOSIS — F332 Major depressive disorder, recurrent severe without psychotic features: Secondary | ICD-10-CM

## 2018-02-10 DIAGNOSIS — R269 Unspecified abnormalities of gait and mobility: Secondary | ICD-10-CM

## 2018-02-10 DIAGNOSIS — M549 Dorsalgia, unspecified: Secondary | ICD-10-CM

## 2018-02-10 DIAGNOSIS — Z89512 Acquired absence of left leg below knee: Secondary | ICD-10-CM | POA: Diagnosis not present

## 2018-02-10 DIAGNOSIS — Z79899 Other long term (current) drug therapy: Secondary | ICD-10-CM

## 2018-02-10 DIAGNOSIS — Z79891 Long term (current) use of opiate analgesic: Secondary | ICD-10-CM | POA: Diagnosis not present

## 2018-02-10 DIAGNOSIS — B372 Candidiasis of skin and nail: Secondary | ICD-10-CM | POA: Diagnosis not present

## 2018-02-10 DIAGNOSIS — I10 Essential (primary) hypertension: Secondary | ICD-10-CM

## 2018-02-10 MED ORDER — DULOXETINE HCL 30 MG PO CPEP: 30.0000 mg | ORAL_CAPSULE | Freq: Every day | ORAL | 0 refills | Status: DC

## 2018-02-10 NOTE — Assessment & Plan Note (Signed)
This problem is chronic and stable. She requests a prescription for a left lower extremity sleeve as her current sleeve will not click into her prosthesis hole. She got it in 2014 from [place] at [facility].  PLAN : Rx LLE sleeve

## 2018-02-10 NOTE — Assessment & Plan Note (Signed)
This problem is chronic and unstable. She states she took all of her blood pressure medicines today. Her regimen includes amlodipine 10, atenolol 100, benazepril 40, hydrochlorothiazide 25, and Lasix when necessary. We are focusing on her opioid withdrawal and all of her medications are currently at max. I am adding a new medication, duloxetine, and therefore do not want had a new antihypertensive in case she has side effects. If I am able to get her mental health issues, including depression under control, we will need to focus on her chronic medical issues including hypertension.  PLAN:  Cont current meds    BP Readings from Last 3 Encounters:  02/10/18 (!) 155/63  01/28/18 (!) 153/88  01/23/18 (!) 135/51

## 2018-02-10 NOTE — Assessment & Plan Note (Signed)
This problem is chronic and uncontrolled. She has not yet reached out to schedule an appointment with a counselor. She states her phone will be turned on March 1 and plans on calling to make an appointment on that day. She currently is on sertraline and is instructed to take 150 mg once a day but she states she is only taking 1 pill, which would be 50 mg, once a day. I confirmed this dosage with her a total of 3 times to ensure she truly was only taking 50 mg once a day. She is also on bupropion 300 mg once a day. The goal is to get her onto duloxetine to help with depression and pain. I do not want to stop the bupropion as she is making some success in decreasing her smoking amount, mostly due to financial restraints rather than a desire to quit. Therefore I am leaving her appropriately 100 once a day. Since she is only on sertraline 50, I am stopping that today rather than tapering it. I am also starting duloxetine 30 mg daily. This may be titrated up to 60 mg response.These plans were made in conjunction with Dr. Maudie Mercury, clinical pharmacist.  PLAN : Stop Sertraline 50 Continue bupropion 300 once a day Duloxetine 30 daily Return to clinic in 1 week

## 2018-02-10 NOTE — Assessment & Plan Note (Signed)
This problem is chronic and uncontrolled. She continues complaining of left rash over left lower abdomen where the skin folds is. She is currently taking Diflucan once a week prescribed to her in the hospital. She has one pill left. She has remaining nystatin powder and I have encouraged her to continue to use this. On exam, there is a hyperpigmented area that I did not notice any smell and there is no satellite lesions. I went over hygiene instructions including keeping the area as dry and cool as possible. I will get home health in 4 wound care assessment and disease management.  PLAN : Cont diflucan and nystatin Home health

## 2018-02-10 NOTE — Assessment & Plan Note (Addendum)
This problem is chronic and improving. She is on an opioid taper which was outlined 2 weeks ago on her AVS. She subsequently saw Dr. Maudie Mercury, clinical pharmacist, last week. She is not strictly adhering to the opioid taper which I laid out in her AVS. However, I do think that she is making an effort, coming to all appointments, and excepting of alternatives treatments. Therefore, I am working with her and will be willing to give an early refill if needed. She cannot tell me exactly how many she is taking a although she states she is keeping a journal of this. She stated she is trying to adhere to 7 pills a day. She usually takes 4 doses a day, either one pill if her pain is not too bad or 2 pills if her pain is bad. She usually takes these at 10 or 11 AM, 3 PM, 7 PM, and one additional dose during the night. She did not mention pain all to the nurse who checked her in, she also did not mention pain at all until I brought it up. Her COWS and PEG scores are not worsening nor are they indicating withdrawal symptoms. She actually appears much better to me overall and she has in the past. I think she is tolerating the taper, such as it is. She states she has another week and a half of oxycodone left. I had given her a month's supply 2 weeks ago and if her estimate is accurate, she will only run out about a half a week early which is not too bad.  COWS 6 - 6 - 1 today PEG 30 - 26 - 27.5 today  PLAN : Return in one week to see Dr. Maudie Mercury I instructed her to continue to write down her doses and to bring it in I encouraged her to bring in her oxycodone bottle at her next appointment so we can see how much she has left I will be willing to give her an early refill if needed Add duloxetine

## 2018-02-10 NOTE — Progress Notes (Signed)
   Subjective:    [name], female    DOB: 1961/05/07, 57 y.o.   MRN: [id number]  HPI  Jennifer [name] is here for opioid taper assessment. Please see the A&P for the status of the pt's chronic medical problems.  ROS : per ROS section and in problem oriented charting. All other systems are negative.  PMHx, Soc hx, and / or Fam hx : Still living with son's friend. Son's sig other came with her today. Phone will not be turned on until March 1st  Review of Systems  HENT: Negative for rhinorrhea.   Eyes: Negative for discharge and itching.  Respiratory: Negative for cough.   Gastrointestinal: Negative for diarrhea, nausea and vomiting.  Musculoskeletal: Positive for arthralgias, back pain and gait problem.  Skin: Positive for color change and rash.  Psychiatric/Behavioral: Positive for sleep disturbance.       Objective:   Physical Exam  Constitutional: She appears well-developed and well-nourished. No distress.  HENT:  Head: Normocephalic and atraumatic.  Right Ear: External ear normal.  Left Ear: External ear normal.  Nose: Nose normal.  Eyes: Conjunctivae are normal.  Disconjugate gaze  Skin: Skin is warm and dry. She is not diaphoretic.  Hyperpig area L ABD panus. No odor No mass / lump RLQ where pt indicated  Psychiatric: She has a normal mood and affect. Her behavior is normal. Thought content normal.       Assessment & Plan:

## 2018-02-10 NOTE — Patient Instructions (Addendum)
1. Zoloft stop 2. Start cymbalta - depression and pain  3. Continue to not smoke 4. Return one week to see Dr Maudie Mercury

## 2018-02-17 ENCOUNTER — Other Ambulatory Visit: Payer: Self-pay

## 2018-02-17 ENCOUNTER — Other Ambulatory Visit: Payer: Self-pay | Admitting: *Deleted

## 2018-02-17 ENCOUNTER — Encounter: Payer: Self-pay | Admitting: *Deleted

## 2018-02-17 ENCOUNTER — Ambulatory Visit (INDEPENDENT_AMBULATORY_CARE_PROVIDER_SITE_OTHER): Payer: Medicare Other | Admitting: Pharmacist

## 2018-02-17 VITALS — BP 157/72 | HR 71 | Temp 98.1°F

## 2018-02-17 DIAGNOSIS — Z79891 Long term (current) use of opiate analgesic: Secondary | ICD-10-CM | POA: Diagnosis not present

## 2018-02-17 NOTE — Patient Instructions (Signed)
Try Garvin Fila for skin and vitamin A&D ointment or other moisturizer  PERCOCET LOG (bring to next appointment)  Date   First Dose Time Taken  # of Tablets     Second Dose Time Taken  # of Tablets     Third Dose Time Taken  # of Tablets     Fourth Dose Time Taken  # of Tablets     Other Time Taken  # of Tablets       Date   First Dose Time Taken  # of Tablets     Second Dose Time Taken  # of Tablets     Third Dose Time Taken  # of Tablets     Fourth Dose Time Taken  # of Tablets     Other Time Taken  # of Tablets       Date   First Dose Time Taken  # of Tablets     Second Dose Time Taken  # of Tablets     Third Dose Time Taken  # of Tablets     Fourth Dose Time Taken  # of Tablets     Other Time Taken  # of Tablets       Date   First Dose Time Taken  # of Tablets     Second Dose Time Taken  # of Tablets     Third Dose Time Taken  # of Tablets     Fourth Dose Time Taken  # of Tablets     Other Time Taken  # of Tablets       Date   First Dose Time Taken  # of Tablets     Second Dose Time Taken  # of Tablets     Third Dose Time Taken  # of Tablets     Fourth Dose Time Taken  # of Tablets     Other Time Taken  # of Tablets       Date   First Dose Time Taken  # of Tablets     Second Dose Time Taken  # of Tablets     Third Dose Time Taken  # of Tablets     Fourth Dose Time Taken  # of Tablets     Other Time Taken  # of Tablets       Date   First Dose Time Taken  # of Tablets     Second Dose Time Taken  # of Tablets     Third Dose Time Taken  # of Tablets     Fourth Dose Time Taken  # of Tablets     Other Time Taken  # of Tablets

## 2018-02-17 NOTE — Patient Outreach (Signed)
[facility]) Care Management  02/17/2018  Jennifer [name] Dec 09, 1961 [id number]   CSW was able to meet with patient today at the [facility] to perform an initial visit.  Patient appeared to be in good spirits today, admitting that she is no longer experiencing symptoms of depression and denies feeling suicidal.  Patient went on to say that she is taking her antidepressant medications exactly as prescribed and that she will get her new prescription for Cymbalta filled on Friday, February 19, 2018, when she receives her Social Security check. Patient indicated that "being homeless" caused her to "spiral into a deep depression", but that she has since found a place to live and is much happier.  Patient went on to say that she is hopeful to be able to afford her own place soon, but indicated that she is happy with where she currently resides.  Patient is living with a friend (her son's girlfriends' ex-husband) and has been told that she can remain living there for as long as needed.  CSW provided patient with a list of low-income housing apartments for seniors, as well as a Tourist information centre manager.  Patient admitted that she is not interested in living in a senior high rise apartment, an independent living apartment or any assisted living facility.  Patient has permanently lost her [place], due to allowing someone live with her that was not included on the contractural agreement. CSW was able to offer counseling and supportive services to patient, as patient admitted that she has experienced a great deal of loss within the last few years.  CSW agreed to make a referral for patient to [facility]; although, patient adamantly refused, as this time.  Patient admitted that she knows she needs psychotherapeutic services, as well as medication management, but reports that she is not ready to "begin that process".  CSW provided patient  with the contact information for [facility], agreeing to assist patient with the referral process, when patient is ready.  CSW also spoke with patient about Grief and Loss Counseling through [facility], but patient denied having any interest.  Patient was encouraged to continue to take her prescription medications exactly as prescribed and to get her prescription for Cymbalta filled on Friday, February 19, 2018, when she receives her next [employer] check.  Patient is also taking Zoloft and Wellbutrin for her symptoms of depression.  Patient indicated that she is able to afford to get her prescription medications filled.  Patient has [employer] primary and Adult Medicaid coverage secondary, to help pay for her Medicare premiums.   Patient reported that [facility], Referral Coordinator with Dr. Loralie Champagne (Primary Care Physician) office, has agreed to assist patient with obtaining a new wheelchair, sleeve for prothesis and a bathing bucket for home use.  Patient denies needing any additional durable medical equipment at this time.  Patient also denied the need for home health services and/or a home health aide.  Patient stated that she has transportation services arranged through [employer] [phone]), as well as [employer] Paramedic) through [employer]).  Patient further stated that she is entitled to 71 free one-way rides per fiscal year through [place], Manufacturing engineer of [employer].  Patient went on to say that she plans to get her phone reconnected on Friday, February 19, 2018, as she indicated that she will definitely have  the money to afford the reconnection and service fees.  Patient already receives Food Stamps through the [place], in the amount of $192.00 per month.  CSW was able to ensure that patient has the  correct contact information for CSW, encouraging patient to contact CSW directly if additional social work needs arise in the near future.  Patient voiced understanding and was agreeable to this plan.   CSW will perform a case closure on patient, as all goals of treatment have been met from social work standpoint and no additional social work needs have been identified at this time.  CSW will notify patient's Telephonic RNCM with [place] [facility], [place] of CSW's plans to close patient's case.  CSW will fax an update to patient's Primary Care Physician, Dr. Larey Dresser to ensure that they are aware of CSW's involvement with patient's plan of care.  CSW will submit a case closure request to Alycia Rossetti, Care Management Assistant with [place] [facility], in the form of an [employer].  CSW will ensure that Jennifer [name] is aware of Mrs. Futrell's, Telephonic RNCM with [place] Management, continued involvement with patient's care. Nat Christen, BSW, MSW, LCSW  Licensed Education officer, environmental [facility] System  Mailing [place] [address] [place], [place] [address] Physical Address-[address], [place], [place] [address] Toll Free Main # [phone] Fax # [phone] Cell # [phone]  Office # [phone] [email or web address]

## 2018-02-18 NOTE — Progress Notes (Signed)
S: Jennifer [name] is a 57 y.o. female reports to clinical pharmacist appointment for help with opioid taper.  Allergies  Allergen Reactions  . Abilify [Aripiprazole] Other (See Comments)    Urinary freq Nov 2016  . Iohexol      Desc: IV CONTRAST CAUSE NEPHROPATHY IN 2007   . Ivp Dye [Iodinated Diagnostic Agents]   . Morphine Sulfate Itching and Rash   Medication Sig  ADVAIR DISKUS 250-50 MCG/DOSE AEPB TAKE 1 INHALATION BY MOUTH TWICE DAILY  albuterol (PROVENTIL) (2.5 MG/3ML) 0.083% nebulizer solution INHALE THE CONTENTS OF 1 VIAL VIA NEBULIZER EVERY 6 HOURS AS NEEDED FOR WHEEZING  amLODipine (NORVASC) 10 MG tablet TAKE 1 TABLET BY MOUTH EVERY DAY Patient taking differently: TAKE 10 mg  TABLET BY MOUTH EVERY DAY  atenolol (TENORMIN) 100 MG tablet TAKE 1 TABLET BY MOUTH EVERY DAY Patient taking differently: TAKE 100 mg TABLET BY MOUTH EVERY DAY  baclofen (LIORESAL) 10 MG tablet TAKE 1 TABLET BY MOUTH THREE TIMES A DAY AS NEEDED FOR MUSCLE SPASMS Patient taking differently: TAKE 10 mg TABLET BY MOUTH THREE TIMES A DAY AS NEEDED FOR MUSCLE SPASMS  benazepril (LOTENSIN) 40 MG tablet TAKE 1 TABLET BY MOUTH ONCE DAILY Patient taking differently: TAKE 40 TABLET BY MOUTH ONCE DAILY  buPROPion (WELLBUTRIN XL) 300 MG 24 hr tablet TAKE 1 TABLET BY MOUTH ONCE DAILY Patient taking differently: TAKE 300 mg TABLET BY MOUTH ONCE DAILY  cholecalciferol (VITAMIN D) 1000 units tablet Take 1,000 Units by mouth daily.  doxycycline (VIBRA-TABS) 100 MG tablet Take 1 tablet (100 mg total) by mouth every 12 (twelve) hours. For 6 more days.  DULoxetine (CYMBALTA) 30 MG capsule Take 1 capsule (30 mg total) by mouth daily.  fluconazole (DIFLUCAN) 150 MG tablet Take 1 tablet (150 mg total) by mouth once a week. For 3 more weeks.  fluticasone (FLONASE) 50 MCG/ACT nasal spray INSTILL 1 SPRAY IN EACH NOSTRIL DAILY  furosemide (LASIX) 40 MG tablet TAKE 1 TABLET BY MOUTH DAILY AS NEEDED Patient taking differently:  TAKE 40 mg  TABLET BY MOUTH DAILY AS NEEDED  hydrochlorothiazide (HYDRODIURIL) 25 MG tablet TAKE 1 TABLET BY MOUTH EVERY DAY Patient taking differently: TAKE 25 mg TABLET BY MOUTH EVERY DAY  hydrOXYzine (ATARAX/VISTARIL) 25 MG tablet Take 1 tablet (25 mg total) by mouth 3 (three) times daily as needed for itching.  Insulin Lispro Prot & Lispro (HUMALOG MIX 75/25 KWIKPEN) (75-25) 100 UNIT/ML Kwikpen Inject 130 Units into the skin daily with breakfast AND 85 Units daily before supper. INJECT 130 UNITS SUBCUTANEOUSLY EVERY MORNING BEFORE BREAKFAST AND 85 UNITS BEFORE SUPPER. Patient taking differently: Inject 130 Units into the skin daily with breakfast AND 85 Units daily before supper. INJECT 140 UNITS SUBCUTANEOUSLY EVERY MORNING BEFORE BREAKFAST AND 85 UNITS BEFORE SUPPER.  ipratropium (ATROVENT) 0.02 % nebulizer solution USE 1 VIAL VIA NEBULIZER EVERY 6 HOURS AS NEEDED FOR WHEEZING  LYRICA 200 MG capsule TAKE 1 CAPSULE BY MOUTH THREE TIMES A DAY Patient taking differently: TAKE 200 mg CAPSULE BY MOUTH THREE TIMES A DAY  metFORMIN (GLUCOPHAGE-XR) 500 MG 24 hr tablet TAKE 1 TABLET BY MOUTH EVERY DAY WITH BREAKFAST Patient taking differently: TAKE 500 mg  TABLET BY MOUTH EVERY DAY WITH BREAKFAST  oseltamivir (TAMIFLU) 75 MG capsule Take 1 capsule (75 mg total) by mouth 2 (two) times daily.  oxyCODONE-acetaminophen (PERCOCET/ROXICET) 5-325 MG tablet Take 1-2 tablets by mouth every 4 (four) hours as needed for moderate pain or severe pain. This is a 6  day supply  potassium chloride SA (K-DUR,KLOR-CON) 20 MEQ tablet TAKE 2 TABLETS BY MOUTH DAILY  ranitidine (ZANTAC) 150 MG tablet TAKE 1 TABLET BY MOUTH TWICE DAILY Patient taking differently: TAKE 150 mg TABLET BY MOUTH TWICE DAILY  rosuvastatin (CRESTOR) 20 MG tablet TAKE 1 TABLET BY MOUTH AT BEDTIME Patient taking differently: TAKE 20 mg TABLET BY MOUTH AT BEDTIME  traZODone (DESYREL) 100 MG tablet TABLET BY MOUTH DAILY AT BEDTIME AS NEEDED FOR SLEEP   vitamin B-12 (CYANOCOBALAMIN) 100 MCG tablet Take 100 mcg by mouth daily.  vitamin C (ASCORBIC ACID) 500 MG tablet Take 500 mg by mouth daily.  Potassium Chloride ER 20 MEQ TBCR Take 40 mEq by mouth daily.   Past Medical History:  Diagnosis Date  . Anginal pain [facility])    '3' of 10 ischemia ruled out 9/9   . Arthritis of lumbar spine   . Asthma   . CHF (congestive heart failure) ([place])   . 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 Chronic bronchitis ([place])    "I get it alot" (09/28/2013)  . Chronic diastolic heart failure (HCC)    grade 2 per 2D echocardiogram (01/2013)  . Chronic lower back pain   . Chronic osteomyelitis of foot (HCC)    chronic, right secondary to diabetic foot ulcers  . Chronic pain syndrome 12/03/2011   Likely secondary to depression, "fibromyalgia", neuropathy, and obesity. Lumbar MRI 2014 no sig change from prior (2008) : Stable hypertrophic facet disease most notable at L4-5. Stable shallow left foraminal/extraforaminal disc protrusion at L4-5. No direct neural compression.      Marland Kitchen COPD 01/08/2007   PFT's 05/2007 : FEV1/FVC 82, FEV1 64% pred, FEF 25-75% 40% predicted, 16% improvement in FEV1 with bronchodilators.     . Depression   . Diabetic peripheral neuropathy ([place])   . DVT of upper extremity (deep vein thrombosis) ([place]) 03/11/2013   Secondary to PICC line. Right brachial vein, diagnosed on 03/10/2013 Coumadin for 3 months. End date 06/10/2013   . Environmental allergies    Hx: of  . Exertional shortness of breath   . Fatty liver 2003   observed on ultrasound abdomen  . Fibromyalgia   . GERD (gastroesophageal reflux disease)   . Glaucoma   . Hyperlipidemia   . Hyperplastic colon polyp 12/2010   Per colonoscopy (12/2010) - Dr. Deatra Ina  . Hypertension   . 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 Blood cultures 01/26/13 S. Aureus and GBS. Blood cultures Feb 6th, 8th, and 9th and March were  negative.Repeat TEE 3/20 negative for vegitations  . 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 2/2 tobacco use, DM, and cont weight bearing on surgical wound and developed gangrene   . Polymicrobial bacterial infection 01/2013   GBS and S. aureus bacteremia // Source likely infected diabetic foot ulcer  . PVD (peripheral vascular disease) with claudication ([place])    Stents to bilateral common iliac arteries (left 2005, right 2008), on chronic plavix  . Rheumatoid arthritis ([place])   . Tobacco abuse   . Type II diabetes mellitus with peripheral circulatory disorders, uncontrolled DX: 1993   Insulin dep. Poor control. Complicated by diabetic foot ulcer and diabetic eye disease.    Marland Kitchen Ulcer of foot, chronic (HCC)    Left. No OM per MRI (01/2013)   Social History   Socioeconomic History  .  Marital status: Divorced    Spouse name: None  . Number of children: 2  . Years of education: college  . Highest education level: None  Social Needs  . Financial resource strain: None  . Food insecurity - worry: None  . Food insecurity - inability: None  . Transportation needs - medical: None  . Transportation needs - non-medical: None  Occupational History  . Occupation: Disability    Comment: previously worked as a Engineer, materials  . Smoking status: Current Every Day Smoker    Packs/day: 1.00    Years: 44.00    Pack years: 44.00    Types: Cigarettes  . Smokeless tobacco: Never Used  . Tobacco comment: 1PPD  Substance and Sexual Activity  . Alcohol use: No    Alcohol/week: 0.0 oz  . Drug use: No    Comment: 09/28/2013 "no marijuana since 2010-04-07, no crack/cocaine 1989"  . Sexual activity: Not Currently  Other Topics Concern  . None  Social History Narrative   On disability. Lives with son in [place]. Formerly worked as Training and development officer.    Boyfriend passed away stage 4 cancer 2013-04-07.   S/p L BKA 2013/04/07. In wheelchair in paritially  suitable apartment.    Family History  Problem Relation Age of Onset  . Diverticulosis Mother   . Diabetes Mother   . Hypertension Mother   . Congestive Heart Failure Mother   . Asthma Father   . CAD Sister 77       MI at age 42 per patient.  However, she has not had a stent or CABG.   . Heart disease Sister        before age 69   O:    Component Value Date/Time   CHOL 139 06/05/2016 1118   HDL 34 (L) 06/05/2016 1118   TRIG 142 06/05/2016 1118   AST 35 01/14/2017 2131   ALT 34 01/14/2017 2131   NA 136 01/21/2018 1635   NA 144 11/07/2016 1125   K 4.2 01/21/2018 1635   CL 98 (L) 01/21/2018 1635   CO2 27 01/21/2018 1635   GLUCOSE 434 (H) 01/21/2018 1635   HGBA1C 9.4 01/21/2018 1248   HGBA1C 8.5 (H) 11/07/2016 1125   BUN 9 01/21/2018 1635   BUN 15 11/07/2016 1125   CREATININE 1.03 (H) 01/21/2018 1635   CREATININE 0.68 01/31/2015 1641   CALCIUM 8.5 (L) 01/21/2018 1635   GFRNONAA 59 (L) 01/21/2018 1635   GFRNONAA >89 01/31/2015 1641   GFRAA >60 01/21/2018 1635   GFRAA >89 01/31/2015 1641   WBC 5.5 01/22/2018 0944   HGB 11.8 (L) 01/22/2018 0944   HCT 37.9 01/22/2018 0944   PLT 139 (L) 01/22/2018 0944   TSH 1.516 01/07/2017 0151   TSH 1.120 11/07/2016 1125   Ht Readings from Last 2 Encounters:  01/21/18 5\' 7"  (1.702 m)  01/21/18 5\' 7"  (1.702 m)   Wt Readings from Last 2 Encounters:  01/21/18 272 lb 4.3 oz (123.5 kg)  12/18/17 281 lb 4.8 oz (127.6 kg)   There is no height or weight on file to calculate BMI. BP Readings from Last 3 Encounters:  02/17/18 (!) 157/72  02/10/18 (!) 155/63  01/28/18 (!) 153/88     A/P: PEG score 25.5 and COWS 9, no significant worsening of symptoms. Patient did not bring medication bottles or log of opioid doses taken. She estimates taking 7-8 tablets per day of oxycodone-acetaminophen (Percocet). I reinforced education today on the importance of logging Percocet  doses and bringing bottles to visits. I created a log for patient to take  home today. Patient states she is still working on scheduling with mental health.  Patient reports itching and skin irritation, states she does not use a moisturizer. Recommended patient start using a moisturizer and follow up if persists/worsens.  Patient has not yet started duloxetine due to remaining supply of sertraline and not yet picking up duloxetine.  An after visit summary was provided and patient advised to follow up in 1 week or sooner if any changes in condition or questions regarding medications arise.   The patient verbalized understanding of information provided by repeating back concepts discussed.   30 minutes spent face-to-face with the patient during the encounter. 100% of time spent on education.

## 2018-02-19 ENCOUNTER — Telehealth: Payer: Self-pay | Admitting: Internal Medicine

## 2018-02-19 ENCOUNTER — Other Ambulatory Visit: Payer: Self-pay | Admitting: *Deleted

## 2018-02-19 DIAGNOSIS — Z79891 Long term (current) use of opiate analgesic: Secondary | ICD-10-CM

## 2018-02-19 NOTE — Telephone Encounter (Signed)
Patient is saying that someone was suppose to schedule her appt to come in , she is unsure who is suppose to seeing the Mannie Stabile or the physician

## 2018-02-19 NOTE — Telephone Encounter (Signed)
Called pt - no answer; left message, according to her chart, she needs to f/u with Mannie Stabile in a week from last visit.

## 2018-02-19 NOTE — Telephone Encounter (Signed)
Requesting refill on Oxycodone. Also needing med for yeast infection - stated doctor was sending a rx to the pharmacy for a cream; picked up Cymbalta rx bu t no cream.  Last rx written 2/7. Last OV 2/20. UDS 03/26/17.

## 2018-02-19 NOTE — Telephone Encounter (Signed)
If she calls back an appointment has been scheduled with Mannie Stabile for next Wednesday 02/24/18 at 11 am.

## 2018-02-19 NOTE — Telephone Encounter (Signed)
Pt informed of appt

## 2018-02-22 MED ORDER — OXYCODONE-ACETAMINOPHEN 5-325 MG PO TABS: ORAL_TABLET | ORAL | 0 refills | Status: DC

## 2018-02-22 MED ORDER — KETOCONAZOLE 2 % EX CREA: 1.0000 "application " | TOPICAL_CREAM | Freq: Every day | CUTANEOUS | 0 refills | Status: DC

## 2018-02-22 NOTE — Assessment & Plan Note (Signed)
Seen weekly for taper. COWS does not demonstrate withdrawal,. PEG decreasing slightly. Has not set up consoling. Has not kept percocet log. MME most recent week 37.5. Since pt not demonstrating any objective harm from taper and did not adhere to counseling requirement, will cont taper.

## 2018-02-22 NOTE — Addendum Note (Signed)
Addended by: Larey Dresser A on: 02/22/2018 11:00 AM   Modules accepted: Orders

## 2018-02-22 NOTE — Addendum Note (Signed)
Addended by: Larey Dresser A on: 02/22/2018 09:06 AM   Modules accepted: Orders

## 2018-02-22 NOTE — Telephone Encounter (Signed)
Dr [name] , do u know about the cream she's talking about? Thanks

## 2018-02-22 NOTE — Telephone Encounter (Signed)
Thanks for reminder. Sent to [facility]

## 2018-02-24 ENCOUNTER — Encounter: Payer: Self-pay | Admitting: Internal Medicine

## 2018-02-24 ENCOUNTER — Ambulatory Visit (INDEPENDENT_AMBULATORY_CARE_PROVIDER_SITE_OTHER): Payer: Medicare Other | Admitting: Internal Medicine

## 2018-02-24 DIAGNOSIS — R21 Rash and other nonspecific skin eruption: Secondary | ICD-10-CM

## 2018-02-24 DIAGNOSIS — M791 Myalgia, unspecified site: Secondary | ICD-10-CM

## 2018-02-24 DIAGNOSIS — R454 Irritability and anger: Secondary | ICD-10-CM | POA: Diagnosis not present

## 2018-02-24 DIAGNOSIS — E1159 Type 2 diabetes mellitus with other circulatory complications: Secondary | ICD-10-CM | POA: Diagnosis not present

## 2018-02-24 DIAGNOSIS — Z79899 Other long term (current) drug therapy: Secondary | ICD-10-CM

## 2018-02-24 DIAGNOSIS — R269 Unspecified abnormalities of gait and mobility: Secondary | ICD-10-CM

## 2018-02-24 DIAGNOSIS — F332 Major depressive disorder, recurrent severe without psychotic features: Secondary | ICD-10-CM | POA: Diagnosis not present

## 2018-02-24 DIAGNOSIS — L814 Other melanin hyperpigmentation: Secondary | ICD-10-CM

## 2018-02-24 DIAGNOSIS — M549 Dorsalgia, unspecified: Secondary | ICD-10-CM | POA: Diagnosis not present

## 2018-02-24 DIAGNOSIS — Z79891 Long term (current) use of opiate analgesic: Secondary | ICD-10-CM

## 2018-02-24 DIAGNOSIS — I10 Essential (primary) hypertension: Secondary | ICD-10-CM

## 2018-02-24 DIAGNOSIS — R6883 Chills (without fever): Secondary | ICD-10-CM

## 2018-02-24 DIAGNOSIS — G8929 Other chronic pain: Secondary | ICD-10-CM

## 2018-02-24 DIAGNOSIS — Z0000 Encounter for general adult medical examination without abnormal findings: Secondary | ICD-10-CM

## 2018-02-24 MED ORDER — BETAMETHASONE DIPROPIONATE 0.05 % EX CREA
TOPICAL_CREAM | Freq: Two times a day (BID) | CUTANEOUS | 0 refills | Status: DC
Start: 2018-02-24 — End: 2019-01-18

## 2018-02-24 NOTE — Assessment & Plan Note (Signed)
Trial of high potency topical steroid for rash.  No sleeve for LLE yet  Talked to [place] about powerchair.

## 2018-02-24 NOTE — Patient Instructions (Signed)
1. I double checked your pain medicine taper and will have Dr Maudie Mercury triple check it 2. Try the cream for you arms, back, and stomach. Do not use on face, skin folds. Wash your hands after you use it.  3. Return in 2 weeks

## 2018-02-24 NOTE — Progress Notes (Signed)
   Subjective:    [name], female    DOB: Jul 03, 1961, 57 y.o.   MRN: [id number]  HPI  Jennifer [name] is here for Chronic pain and opioid taper. Please see the A&P for the status of the pt's chronic medical problems.  ROS : per ROS section and in problem oriented charting. All other systems are negative.  PMHx, Soc hx, and / or Fam hx : Entered [employer] age 81 but didn't graduate bc family insisted she move with them to [facility].   Review of Systems  Constitutional: Positive for chills. Negative for diaphoresis.  HENT: Negative for rhinorrhea.   Eyes: Negative for discharge and itching.  Gastrointestinal: Negative for abdominal pain, nausea and vomiting.  Musculoskeletal: Positive for arthralgias, back pain, gait problem and myalgias.  Skin: Positive for rash.  Psychiatric/Behavioral: Negative for self-injury and suicidal ideas.       Irritation       Objective:   Physical Exam  Constitutional: She appears well-developed and well-nourished. No distress.  HENT:  Head: Normocephalic and atraumatic.  Right Ear: External ear normal.  Left Ear: External ear normal.  Nose: Nose normal.  Eyes: Conjunctivae are normal. Right eye exhibits no discharge. Left eye exhibits no discharge. No scleral icterus.  Skin: Skin is warm and dry. She is not diaphoretic.  Scattered hyperpigmented papules and macules. Dry skin.  Psychiatric: She has a normal mood and affect. Thought content normal.      Assessment & Plan:

## 2018-02-24 NOTE — Assessment & Plan Note (Signed)
This problem is chronic and stable.  I have continued her opioid taper.  Her COWS score has not indicated any significant withdrawal symptoms.  It was 9 last week and today it is only for. Her PEG score is stable if not improved.  It was 25.5 last week and is 21.5 today.  I am continuing the 10 %/week taper of her oxycodone dose.  It is laid out in the her most recent oxycodone refill which I did grant a few days early.  She has mostly adhered to my requirements although she has not yet gotten counseling.  PLAN : Cont opioid taper I asked Dr Maudie Mercury to quadruple check my taper quantities as pt questioned the # of pills F/U 2 weeks

## 2018-02-24 NOTE — Assessment & Plan Note (Signed)
This problem is chronic and uncontrolled.  She was able to start her Cymbalta yesterday after stopping her Zoloft.  Because she took the Cymbalta and her opioids and other medicines all at once, she had transient nausea.  She has not had any additional nausea since.  She has not yet pursued counseling but states that she got her phone turned back on recently and plans to call to get an appointment today or tomorrow.  We discussed that I think she would benefit immensely from counseling in addition to pharmaceutical treatment.  She states she is depressed every day but it is just her normal state.  She states that she is not is severely depressed as she was a month or 2 ago when she ended up being admitted.  PLAN:  Cont current meds To sch counseling

## 2018-02-24 NOTE — Assessment & Plan Note (Signed)
This problem is chronic and uncontrolled.  This will not get under better control until she gets her mental health issues and social/economic issues under control.  PLAN:  Cont current meds

## 2018-03-10 ENCOUNTER — Ambulatory Visit (INDEPENDENT_AMBULATORY_CARE_PROVIDER_SITE_OTHER): Payer: Medicare Other | Admitting: Internal Medicine

## 2018-03-10 DIAGNOSIS — G4700 Insomnia, unspecified: Secondary | ICD-10-CM

## 2018-03-10 DIAGNOSIS — J069 Acute upper respiratory infection, unspecified: Secondary | ICD-10-CM

## 2018-03-10 DIAGNOSIS — E1159 Type 2 diabetes mellitus with other circulatory complications: Secondary | ICD-10-CM

## 2018-03-10 DIAGNOSIS — Z79899 Other long term (current) drug therapy: Secondary | ICD-10-CM

## 2018-03-10 DIAGNOSIS — R21 Rash and other nonspecific skin eruption: Secondary | ICD-10-CM | POA: Diagnosis not present

## 2018-03-10 DIAGNOSIS — M791 Myalgia, unspecified site: Secondary | ICD-10-CM

## 2018-03-10 DIAGNOSIS — Z79891 Long term (current) use of opiate analgesic: Secondary | ICD-10-CM

## 2018-03-10 DIAGNOSIS — Z89519 Acquired absence of unspecified leg below knee: Secondary | ICD-10-CM

## 2018-03-10 DIAGNOSIS — F329 Major depressive disorder, single episode, unspecified: Secondary | ICD-10-CM

## 2018-03-10 DIAGNOSIS — I10 Essential (primary) hypertension: Secondary | ICD-10-CM | POA: Diagnosis not present

## 2018-03-10 DIAGNOSIS — Z590 Homelessness: Secondary | ICD-10-CM | POA: Diagnosis not present

## 2018-03-10 DIAGNOSIS — Z0000 Encounter for general adult medical examination without abnormal findings: Secondary | ICD-10-CM

## 2018-03-10 NOTE — Patient Instructions (Signed)
1. You have a virus and it will run its course 2. In addition to what you are doing, try saline nose spray and a bit of Vasline in you nose 3. See me in one month

## 2018-03-11 NOTE — Progress Notes (Signed)
   Subjective:    [name], female    DOB: 02-13-1961, 57 y.o.   MRN: [id number]  HPI  Jennifer [name] is here for opioid taper. Please see the A&P for the status of the pt's chronic medical problems.  ROS : per ROS section and in problem oriented charting. All other systems are negative.  PMHx, Soc hx, and / or Fam hx : Looking for new housing. Other family members with cold like sxs, one granddaughter with pink eye.   Review of Systems  Constitutional: Positive for chills, diaphoresis and fever. Negative for appetite change.  HENT: Positive for congestion.   Respiratory: Positive for cough and shortness of breath.   Cardiovascular: Positive for leg swelling.  Gastrointestinal: Negative for abdominal pain, nausea and vomiting.  Musculoskeletal: Positive for gait problem and myalgias.  Skin: Positive for rash.       Objective:   Physical Exam  Constitutional: She appears well-developed and well-nourished. No distress.  HENT:  Head: Normocephalic and atraumatic.  Right Ear: External ear normal.  Left Ear: External ear normal.  Nose: Nose normal.  Mouth/Throat: Oropharynx is clear and moist. No oropharyngeal exudate.  No tenderness over sinuses  Eyes: Conjunctivae and EOM are normal. Right eye exhibits no discharge. Left eye exhibits no discharge. No scleral icterus.  Neck: Normal range of motion. Neck supple.  Cardiovascular: Normal rate, regular rhythm and normal heart sounds.  No murmur heard. Pulmonary/Chest: Effort normal and breath sounds normal. No respiratory distress. She has no wheezes.  Musculoskeletal: She exhibits edema. She exhibits no tenderness.  Brawny, non pitting edema RLE  Lymphadenopathy:    She has no cervical adenopathy.  Neurological: She is alert.  Skin: Skin is warm. She is not diaphoretic.  Psychiatric: She has a normal mood and affect. Her behavior is normal. Judgment and thought content normal.      Assessment & Plan:

## 2018-03-11 NOTE — Assessment & Plan Note (Addendum)
This problem is chronic and uncontrolled. She is prescribed Norvasc 10, atenolol 100, HCTZ 15, benezapril 40. Also lasix but doesn't take 2/2 urinary freq and urgency. She does seem to pick up all meds on reg basis but I still question compliance. I will disucss with our pharmacy team whether her insurance will cover a combo med like tribenzor to help increase compliance and BP control.   PLAN : look into med combo coverage

## 2018-03-12 ENCOUNTER — Other Ambulatory Visit: Payer: Self-pay | Admitting: Internal Medicine

## 2018-03-12 MED ORDER — OXYCODONE-ACETAMINOPHEN 5-325 MG PO TABS: 1.0000 | ORAL_TABLET | Freq: Three times a day (TID) | ORAL | 0 refills | Status: DC | PRN

## 2018-03-12 NOTE — Assessment & Plan Note (Signed)
This problem is chronic and improving.  Today's visit was for a 2-week checkup regarding her opioid taper.  She did not want to bring up pain or not liking the taper.  Her only question was when she would get her refill.  Even though she has an acute illness today, a URI, her PEG score remained well controlled at 25.5.  Her pain on average was 6.5, the lowest I have seen.  Her pain affecting her enjoyment a little of life was a 9 and her pain interfering with clinical activity was an 8.  Ideally, with a taper, not only what her pain level be decreasing but her functional level would be increasing.  However, she has many comorbidities that would affect her enjoyment of life and her general activity like depression, insomnia, homelessness, and a BKA, so I wonder if these are the reasons that her enjoyment in general activity are not increasing despite a decreasing pain score.  I think we might be at the sweet spot in regards to her opioid taper.  So when I refill it, I will not be tapering it as I plan to take a temporary hiatus in the taper.  I plan to follow her for a couple of months and then reassess the necessity of the opioid in order to resume the taper.  PLAN : oxycodone 5 / 325 #100 for TID dosing, with an occasional QID day Will be 24 MME per day

## 2018-03-12 NOTE — Assessment & Plan Note (Signed)
URI -this is a new problem.  Last week, about 7 days ago, she developed nose congestion, throat congestion, sweating, coughing, occasional streaky blood in her sputum, nose bleeding, and occasional worsening of her breathing/dyspnea, she is uncertain if he has had a temperature.  She denies nausea vomiting and diarrhea and states that she is eating well.  She does not really complain of significant increase of her baseline myalgias.  Her O2 sat was 99% on room air and her lungs showed good air flow without any wheezing or rhonchi.  I think she is having a viral URI.  I doubt this is influenza as she did not describe significant muscle aches and no pseudocyst congestion in the upper airway.  We discussed symptomatic treatment including Flonase, Mucinex, saline nasal irrigation, and simply giving it time to resolve. I also advised her that she could use a little bit of Vaseline in the distal nares to try to help decrease the bleeding.  PLAN : symptomatic tx

## 2018-03-18 ENCOUNTER — Other Ambulatory Visit: Payer: Self-pay | Admitting: *Deleted

## 2018-03-18 NOTE — Telephone Encounter (Signed)
Next appt scheduled  4/18 with PCP. Also requesting 90 day supply.

## 2018-03-19 MED ORDER — DULOXETINE HCL 30 MG PO CPEP: 30.0000 mg | ORAL_CAPSULE | Freq: Every day | ORAL | 1 refills | Status: DC

## 2018-03-31 ENCOUNTER — Encounter (HOSPITAL_BASED_OUTPATIENT_CLINIC_OR_DEPARTMENT_OTHER): Payer: Medicare Other | Attending: Internal Medicine

## 2018-03-31 DIAGNOSIS — Z794 Long term (current) use of insulin: Secondary | ICD-10-CM | POA: Insufficient documentation

## 2018-03-31 DIAGNOSIS — L97811 Non-pressure chronic ulcer of other part of right lower leg limited to breakdown of skin: Secondary | ICD-10-CM | POA: Insufficient documentation

## 2018-03-31 DIAGNOSIS — E11622 Type 2 diabetes mellitus with other skin ulcer: Secondary | ICD-10-CM | POA: Insufficient documentation

## 2018-03-31 DIAGNOSIS — J449 Chronic obstructive pulmonary disease, unspecified: Secondary | ICD-10-CM | POA: Insufficient documentation

## 2018-03-31 DIAGNOSIS — M069 Rheumatoid arthritis, unspecified: Secondary | ICD-10-CM | POA: Insufficient documentation

## 2018-03-31 DIAGNOSIS — I110 Hypertensive heart disease with heart failure: Secondary | ICD-10-CM | POA: Insufficient documentation

## 2018-03-31 DIAGNOSIS — I87311 Chronic venous hypertension (idiopathic) with ulcer of right lower extremity: Secondary | ICD-10-CM | POA: Insufficient documentation

## 2018-03-31 DIAGNOSIS — I890 Lymphedema, not elsewhere classified: Secondary | ICD-10-CM | POA: Insufficient documentation

## 2018-03-31 DIAGNOSIS — Z89512 Acquired absence of left leg below knee: Secondary | ICD-10-CM | POA: Insufficient documentation

## 2018-03-31 DIAGNOSIS — E1140 Type 2 diabetes mellitus with diabetic neuropathy, unspecified: Secondary | ICD-10-CM | POA: Insufficient documentation

## 2018-03-31 DIAGNOSIS — I509 Heart failure, unspecified: Secondary | ICD-10-CM | POA: Insufficient documentation

## 2018-03-31 DIAGNOSIS — E1151 Type 2 diabetes mellitus with diabetic peripheral angiopathy without gangrene: Secondary | ICD-10-CM | POA: Insufficient documentation

## 2018-04-08 ENCOUNTER — Ambulatory Visit (INDEPENDENT_AMBULATORY_CARE_PROVIDER_SITE_OTHER): Payer: Medicare Other | Admitting: Internal Medicine

## 2018-04-08 ENCOUNTER — Other Ambulatory Visit: Payer: Self-pay

## 2018-04-08 ENCOUNTER — Ambulatory Visit (HOSPITAL_COMMUNITY)
Admission: RE | Admit: 2018-04-08 | Discharge: 2018-04-08 | Disposition: A | Discharge status: Home / Self Care | Payer: Medicare Other | Source: Ambulatory Visit | Attending: Internal Medicine | Admitting: Internal Medicine

## 2018-04-08 ENCOUNTER — Encounter: Payer: Self-pay | Admitting: Internal Medicine

## 2018-04-08 VITALS — BP 130/69 | HR 95 | Wt 277.4 lb

## 2018-04-08 DIAGNOSIS — Z89512 Acquired absence of left leg below knee: Secondary | ICD-10-CM

## 2018-04-08 DIAGNOSIS — Z9714 Presence of artificial left leg (complete) (partial): Secondary | ICD-10-CM

## 2018-04-08 DIAGNOSIS — F339 Major depressive disorder, recurrent, unspecified: Secondary | ICD-10-CM | POA: Diagnosis not present

## 2018-04-08 DIAGNOSIS — I10 Essential (primary) hypertension: Secondary | ICD-10-CM

## 2018-04-08 DIAGNOSIS — R21 Rash and other nonspecific skin eruption: Secondary | ICD-10-CM | POA: Diagnosis not present

## 2018-04-08 DIAGNOSIS — Z7951 Long term (current) use of inhaled steroids: Secondary | ICD-10-CM

## 2018-04-08 DIAGNOSIS — J449 Chronic obstructive pulmonary disease, unspecified: Secondary | ICD-10-CM | POA: Diagnosis not present

## 2018-04-08 DIAGNOSIS — E1165 Type 2 diabetes mellitus with hyperglycemia: Secondary | ICD-10-CM | POA: Diagnosis not present

## 2018-04-08 DIAGNOSIS — Z6841 Body Mass Index (BMI) 40.0 and over, adult: Secondary | ICD-10-CM

## 2018-04-08 DIAGNOSIS — R05 Cough: Secondary | ICD-10-CM | POA: Insufficient documentation

## 2018-04-08 DIAGNOSIS — F17200 Nicotine dependence, unspecified, uncomplicated: Secondary | ICD-10-CM | POA: Diagnosis not present

## 2018-04-08 DIAGNOSIS — R059 Cough, unspecified: Secondary | ICD-10-CM

## 2018-04-08 DIAGNOSIS — Z89519 Acquired absence of unspecified leg below knee: Secondary | ICD-10-CM

## 2018-04-08 DIAGNOSIS — M62562 Muscle wasting and atrophy, not elsewhere classified, left lower leg: Secondary | ICD-10-CM

## 2018-04-08 DIAGNOSIS — L299 Pruritus, unspecified: Secondary | ICD-10-CM

## 2018-04-08 DIAGNOSIS — Z0000 Encounter for general adult medical examination without abnormal findings: Secondary | ICD-10-CM

## 2018-04-08 DIAGNOSIS — I110 Hypertensive heart disease with heart failure: Secondary | ICD-10-CM

## 2018-04-08 DIAGNOSIS — Z79891 Long term (current) use of opiate analgesic: Secondary | ICD-10-CM

## 2018-04-08 DIAGNOSIS — J42 Unspecified chronic bronchitis: Secondary | ICD-10-CM

## 2018-04-08 DIAGNOSIS — I5032 Chronic diastolic (congestive) heart failure: Secondary | ICD-10-CM

## 2018-04-08 DIAGNOSIS — G8929 Other chronic pain: Secondary | ICD-10-CM

## 2018-04-08 DIAGNOSIS — E1151 Type 2 diabetes mellitus with diabetic peripheral angiopathy without gangrene: Secondary | ICD-10-CM

## 2018-04-08 DIAGNOSIS — L821 Other seborrheic keratosis: Secondary | ICD-10-CM | POA: Diagnosis not present

## 2018-04-08 DIAGNOSIS — Z79899 Other long term (current) drug therapy: Secondary | ICD-10-CM

## 2018-04-08 DIAGNOSIS — E1159 Type 2 diabetes mellitus with other circulatory complications: Secondary | ICD-10-CM | POA: Diagnosis not present

## 2018-04-08 DIAGNOSIS — IMO0002 Reserved for concepts with insufficient information to code with codable children: Secondary | ICD-10-CM

## 2018-04-08 DIAGNOSIS — I152 Hypertension secondary to endocrine disorders: Secondary | ICD-10-CM

## 2018-04-08 LAB — GLUCOSE, CAPILLARY: Glucose-Capillary: 188 mg/dL — ABNORMAL HIGH (ref 65–99)

## 2018-04-08 LAB — POCT GLYCOSYLATED HEMOGLOBIN (HGB A1C): Hemoglobin A1C: 8.8

## 2018-04-08 MED ORDER — AMLODIPINE BESY-BENAZEPRIL HCL 10-40 MG PO CAPS: 1.0000 | ORAL_CAPSULE | Freq: Every day | ORAL | 3 refills | Status: DC

## 2018-04-08 MED ORDER — OXYCODONE-ACETAMINOPHEN 5-325 MG PO TABS: 1.0000 | ORAL_TABLET | Freq: Three times a day (TID) | ORAL | 0 refills | Status: DC | PRN

## 2018-04-08 MED ORDER — KETOCONAZOLE 2 % EX CREA: 1.0000 "application " | TOPICAL_CREAM | Freq: Every day | CUTANEOUS | 0 refills | Status: DC

## 2018-04-08 MED ORDER — AMLODIPINE-ATORVASTATIN 10-40 MG PO TABS: 1.0000 | ORAL_TABLET | Freq: Every day | ORAL | 3 refills | Status: DC

## 2018-04-08 MED ORDER — LISINOPRIL-HYDROCHLOROTHIAZIDE 20-25 MG PO TABS: 1.0000 | ORAL_TABLET | Freq: Every day | ORAL | 3 refills | Status: DC

## 2018-04-08 MED ORDER — OLMESARTAN-AMLODIPINE-HCTZ 40-10-12.5 MG PO TABS: 1.0000 | ORAL_TABLET | Freq: Every day | ORAL | 3 refills | Status: DC

## 2018-04-08 MED ORDER — AMLODIPINE-VALSARTAN-HCTZ 10-160-12.5 MG PO TABS: 1.0000 | ORAL_TABLET | Freq: Every day | ORAL | 3 refills | Status: DC

## 2018-04-08 NOTE — Assessment & Plan Note (Signed)
This problem chronic and somewhat controlled.  She continues to smoke but understands she needs to quit. She is on a COPD regimen including  Advair, albuterol nebs, albuterol MDI, Atrovent nebs.  Her exam shows good airflow without any wheezing today.  PLAN : follow

## 2018-04-08 NOTE — Assessment & Plan Note (Signed)
This problem is chronic and improved.  She is on oxycodone and I have been doing a taper since August with reasons outlined in the overview.  Currently, she is on 100 pills of oxycodone 5 mg with acetaminophen so that she may take it 3 times a day with an occasional day of 4 pills a day.  I did not do a full PEG scale today but she states that her pain mostly has been between 5 and 7 although there recently have been a couple of days when it was 10 due to the cold rainy weather.  As I explained in my last office note, I think we are likely at the sweet spot for her pain and I am going to temporarily hold the taper as I think a further  taper it may actually increase her pain.  This is the lowest pain score that I have seen in the past several years.  I will need to get a urine drug screen and complete a contract at her next appointment.  However, she has really never had any red flags and I am not overly concerned that she is misusing the medication.  She states that she has an appointment with psychiatry next week.  PLAN : Electronic prescriptions for her oxycodone for the next 3 months Return to the clinic in 3 months Urine drug screen and contract at her next continuity appointment

## 2018-04-08 NOTE — Assessment & Plan Note (Signed)
This problem is chronic and stable euvolemic.  She appears euvolemic today.  She has Lasix prescribed but only takes it as needed due to polyuria and difficulty getting to the toilet due to her amputation.  PLAN : follow

## 2018-04-08 NOTE — Assessment & Plan Note (Signed)
This problem is chronic and unresolved.  She continues to complain of lesions on her arms, chest, back, and buttocks.  They are itchy.  They start as a small vesicle and then become hypopigmented raised lesions.  They are distressing to her.  She has tried topical corticosteroids without relief.  They do not follow any dermatology pattern to help me know what this is.  I wonder if this is just dry skin and itchy but it would not explain the hyperpigmented papules.  She was okay proceeding to a skin punch biopsy.  Procedure note I explained the risk and the benefits including infection, bleeding, scarring, and not getting a diagnosis.  I was assisted by nurse Ulis Rias.  Her right dorsal forearm was chosen as there was a new lesion to biopsy.  Written consent was obtained.  Nurse Ulis Rias performed a timeout.  The area was cleansed with Betadine.  Lidoderm with epinephrine was injected immediately under the lesion with good anesthesia.  A 2 mm punch biopsy was used to obtain the specimen which was lifted with forceps and the subcutaneous tissue cut with scissors.  It was put into transport media to be sent to pathology.  Bleeding was quickly controlled with pressure and a Band-Aid was applied.  PLAN : await path results

## 2018-04-08 NOTE — Progress Notes (Signed)
Ran test scripts for Jennifer [name] to try and find combination therapies to reduce pill burden. Her insurance will cover an ACE-I two drug combination pill. She could take the Amlodipine 10mg -Benazepril 40mg  combination of which she currently takes separately.  Unfortunately, triple combo pills are not covered. Neither are BP-statin combinations.  Patterson Hammersmith PharmD PGY1 Pharmacy Practice Resident 04/08/2018 11:36 AM

## 2018-04-08 NOTE — Assessment & Plan Note (Signed)
This problem is chronic and improved.  Her regimen is benazepril 40 mg, amlodipine 10 mg, Atenolol 100 mg, HCTZ 25 mg, and Lasix 40 mg as needed edema.  Her blood pressure has historically been uncontrolled but today it is 130/69.  I congratulated her on her success and she stated she is trying to do better so that she does not get dismissed from our clinic.  I had collaborated with our clinical pharmacist to see if there could be any medication combos and they were able to determine her amlodipine and benazepril can be combined into 1 pill at the same dose with a $0 co-pay.  I have sent that into the 5 with instructions to fill it only when she requests a refill on her ACE inhibitor or her amlodipine.  I will send her a letter detailing that I am combining these 2 medicines and a 1 pill to save her cost.   PLAN:  Cont current meds but combo amlodipine and benazepril pill

## 2018-04-08 NOTE — Patient Instructions (Signed)
1. I did a skin punch biopsy today. It will take 1-2 weeks to come back. 2. Great job on your blood pressure! 3. I will refill your pain meds. 100 per 30 days. No more taper for now. 4. See me in 3 months

## 2018-04-08 NOTE — Assessment & Plan Note (Addendum)
This problem is chronic and stable.  She complained of excess skin below her amputation that is causing pain when she uses her prosthesis.  On examination, skin and tissue does extend about 5 inches past her tibia.  There is muscular atrophy over the tibia.  There is no excoriated area or skin breakdown.  She states it gets sore both at the tissue and over the bone when she uses her prosthesis.  Watching her put on the prosthesis, the tissue will be the most dependent part.  She wants to see Dr. due to to see if this extra tissue can be removed to improve comfort with her prosthesis.  She is an established patient of Dr. Jess Barters and can make an appointment herself.  PLAN : She will make an appointment with Dr. Sharol Given

## 2018-04-08 NOTE — Assessment & Plan Note (Signed)
This problem is new. Today, she complains of 5 weeks of a cough. The cough starrted mid March around her birthday when she had a viral URI.  All of the symptoms have resolved with the exception of her cough.  Her cough is productive and is occurring both at night and during the day.  She has no hemoptysis.  Prior to 5 weeks ago, she was without a cough.  Her lung exam is normal today.  We discussed that a post viral cough, especially in a smoker, can take weeks if not months to resolve.  However, as a smoker, she is at high risk and so I obtained a chest x-ray to rule out pulmonary pathology.  I personally reviewed the images which were good quality with PA and lateral views and there were increased markings in the bibasilar segments but no infiltrate or nodule.  She understands she needs to quit smoking.  PLAN : Time Smoking cessation Read final Xray reading

## 2018-04-08 NOTE — Progress Notes (Signed)
   Subjective:    [name], female    DOB: 1961-09-07, 57 y.o.   MRN: [id number]  HPI  Jennifer [name] is here for chronic pain F/U. Please see the A&P for the status of the pt's chronic medical problems.  ROS : per ROS section and in problem oriented charting. All other systems are negative.  PMHx, Soc hx, and / or Fam hx : Her granddaughter recovered from pink eye. Another GD graduating from [place] [employer] early this Dec and will study pre-med at [employer]. Close to getting her own apartment, one bed, will share with 2 sons. Currently living at son's girlfriend's husband place.  Review of Systems  Respiratory: Positive for cough.   Musculoskeletal: Positive for arthralgias, back pain and gait problem.  Skin: Positive for rash.       + prruitis        Objective:   Physical Exam  Constitutional: She appears well-developed and well-nourished. No distress.  HENT:  Head: Normocephalic and atraumatic.  Right Ear: External ear normal.  Left Ear: External ear normal.  Nose: Nose normal.  Eyes: Conjunctivae are normal. Right eye exhibits no discharge. Left eye exhibits no discharge.  Cardiovascular: Normal rate, regular rhythm and normal heart sounds.  No murmur heard. Pulmonary/Chest: Effort normal and breath sounds normal. No respiratory distress. She has no wheezes.  Musculoskeletal: She exhibits deformity. She exhibits no tenderness.  L BKA. There is 4-5 inches skin and tissue extending past femur. Muscular atrophy over femur. No sores.  Dorsal hands B have scooped out appearance over MCP, R>L. R grip strength and ROM nl. Surgical scars on R hand.  Neurological: She is alert.  Skin: Skin is warm and dry. She is not diaphoretic.  On arms and back, there are numerous hyperpigmented papules (2-3 mm). There are scattered 2 mm round excoriated lesions. No vesicles noted.  Psychiatric: She has a normal mood and affect. Her behavior is normal. Judgment and thought content normal.        Assessment & Plan:

## 2018-04-08 NOTE — Assessment & Plan Note (Signed)
This problem is chronic and stable.  She had not been able to stand to weigh due to difficulties with her prosthesis.  Today, she was able to stand and she weighed 277 pounds.  She understands she needs to lose weight although I think this will be difficult due to her amputation, chronic pain, and depression.  The last 2 seem to be a bit more stable with her opioid taper.  Due to back pain, she is not able to stand for a long period or walk.  PLAN : follow

## 2018-04-09 DIAGNOSIS — I87311 Chronic venous hypertension (idiopathic) with ulcer of right lower extremity: Secondary | ICD-10-CM | POA: Diagnosis not present

## 2018-04-09 DIAGNOSIS — L97811 Non-pressure chronic ulcer of other part of right lower leg limited to breakdown of skin: Secondary | ICD-10-CM | POA: Diagnosis not present

## 2018-04-09 DIAGNOSIS — E1151 Type 2 diabetes mellitus with diabetic peripheral angiopathy without gangrene: Secondary | ICD-10-CM | POA: Diagnosis not present

## 2018-04-09 DIAGNOSIS — E1165 Type 2 diabetes mellitus with hyperglycemia: Secondary | ICD-10-CM | POA: Diagnosis not present

## 2018-04-09 DIAGNOSIS — J449 Chronic obstructive pulmonary disease, unspecified: Secondary | ICD-10-CM | POA: Diagnosis not present

## 2018-04-09 DIAGNOSIS — I890 Lymphedema, not elsewhere classified: Secondary | ICD-10-CM | POA: Diagnosis not present

## 2018-04-09 DIAGNOSIS — L97212 Non-pressure chronic ulcer of right calf with fat layer exposed: Secondary | ICD-10-CM | POA: Diagnosis not present

## 2018-04-09 DIAGNOSIS — Z89512 Acquired absence of left leg below knee: Secondary | ICD-10-CM | POA: Diagnosis not present

## 2018-04-09 DIAGNOSIS — E1140 Type 2 diabetes mellitus with diabetic neuropathy, unspecified: Secondary | ICD-10-CM | POA: Diagnosis not present

## 2018-04-09 DIAGNOSIS — E11622 Type 2 diabetes mellitus with other skin ulcer: Secondary | ICD-10-CM | POA: Diagnosis not present

## 2018-04-09 DIAGNOSIS — I509 Heart failure, unspecified: Secondary | ICD-10-CM | POA: Diagnosis not present

## 2018-04-09 DIAGNOSIS — Z794 Long term (current) use of insulin: Secondary | ICD-10-CM | POA: Diagnosis not present

## 2018-04-09 DIAGNOSIS — I110 Hypertensive heart disease with heart failure: Secondary | ICD-10-CM | POA: Diagnosis not present

## 2018-04-09 DIAGNOSIS — M069 Rheumatoid arthritis, unspecified: Secondary | ICD-10-CM | POA: Diagnosis not present

## 2018-04-12 NOTE — Progress Notes (Signed)
Done Could not find 11100 to delete

## 2018-04-15 ENCOUNTER — Other Ambulatory Visit: Payer: Self-pay | Admitting: Internal Medicine

## 2018-04-15 DIAGNOSIS — I776 Arteritis, unspecified: Secondary | ICD-10-CM

## 2018-04-16 DIAGNOSIS — Z794 Long term (current) use of insulin: Secondary | ICD-10-CM | POA: Diagnosis not present

## 2018-04-16 DIAGNOSIS — J449 Chronic obstructive pulmonary disease, unspecified: Secondary | ICD-10-CM | POA: Diagnosis not present

## 2018-04-16 DIAGNOSIS — M069 Rheumatoid arthritis, unspecified: Secondary | ICD-10-CM | POA: Diagnosis not present

## 2018-04-16 DIAGNOSIS — L97212 Non-pressure chronic ulcer of right calf with fat layer exposed: Secondary | ICD-10-CM | POA: Diagnosis not present

## 2018-04-16 DIAGNOSIS — E11622 Type 2 diabetes mellitus with other skin ulcer: Secondary | ICD-10-CM | POA: Diagnosis not present

## 2018-04-16 DIAGNOSIS — L97811 Non-pressure chronic ulcer of other part of right lower leg limited to breakdown of skin: Secondary | ICD-10-CM | POA: Diagnosis not present

## 2018-04-16 DIAGNOSIS — I890 Lymphedema, not elsewhere classified: Secondary | ICD-10-CM | POA: Diagnosis not present

## 2018-04-16 DIAGNOSIS — E1140 Type 2 diabetes mellitus with diabetic neuropathy, unspecified: Secondary | ICD-10-CM | POA: Diagnosis not present

## 2018-04-16 DIAGNOSIS — Z89512 Acquired absence of left leg below knee: Secondary | ICD-10-CM | POA: Diagnosis not present

## 2018-04-16 DIAGNOSIS — I110 Hypertensive heart disease with heart failure: Secondary | ICD-10-CM | POA: Diagnosis not present

## 2018-04-16 DIAGNOSIS — I509 Heart failure, unspecified: Secondary | ICD-10-CM | POA: Diagnosis not present

## 2018-04-16 DIAGNOSIS — S91001A Unspecified open wound, right ankle, initial encounter: Secondary | ICD-10-CM | POA: Diagnosis not present

## 2018-04-16 DIAGNOSIS — I87311 Chronic venous hypertension (idiopathic) with ulcer of right lower extremity: Secondary | ICD-10-CM | POA: Diagnosis not present

## 2018-04-16 DIAGNOSIS — E1151 Type 2 diabetes mellitus with diabetic peripheral angiopathy without gangrene: Secondary | ICD-10-CM | POA: Diagnosis not present

## 2018-04-23 ENCOUNTER — Encounter (HOSPITAL_BASED_OUTPATIENT_CLINIC_OR_DEPARTMENT_OTHER): Payer: Medicare Other | Attending: Internal Medicine

## 2018-04-23 DIAGNOSIS — E1140 Type 2 diabetes mellitus with diabetic neuropathy, unspecified: Secondary | ICD-10-CM | POA: Diagnosis not present

## 2018-04-23 DIAGNOSIS — I890 Lymphedema, not elsewhere classified: Secondary | ICD-10-CM | POA: Insufficient documentation

## 2018-04-23 DIAGNOSIS — L97819 Non-pressure chronic ulcer of other part of right lower leg with unspecified severity: Secondary | ICD-10-CM | POA: Diagnosis not present

## 2018-04-23 DIAGNOSIS — I87311 Chronic venous hypertension (idiopathic) with ulcer of right lower extremity: Secondary | ICD-10-CM | POA: Insufficient documentation

## 2018-04-23 DIAGNOSIS — I509 Heart failure, unspecified: Secondary | ICD-10-CM | POA: Diagnosis not present

## 2018-04-23 DIAGNOSIS — I110 Hypertensive heart disease with heart failure: Secondary | ICD-10-CM | POA: Diagnosis not present

## 2018-04-23 DIAGNOSIS — J449 Chronic obstructive pulmonary disease, unspecified: Secondary | ICD-10-CM | POA: Insufficient documentation

## 2018-04-23 DIAGNOSIS — E11622 Type 2 diabetes mellitus with other skin ulcer: Secondary | ICD-10-CM | POA: Diagnosis not present

## 2018-04-23 DIAGNOSIS — L97212 Non-pressure chronic ulcer of right calf with fat layer exposed: Secondary | ICD-10-CM | POA: Diagnosis not present

## 2018-04-23 DIAGNOSIS — E1136 Type 2 diabetes mellitus with diabetic cataract: Secondary | ICD-10-CM | POA: Diagnosis not present

## 2018-04-23 DIAGNOSIS — I872 Venous insufficiency (chronic) (peripheral): Secondary | ICD-10-CM | POA: Diagnosis not present

## 2018-04-30 DIAGNOSIS — I110 Hypertensive heart disease with heart failure: Secondary | ICD-10-CM | POA: Diagnosis not present

## 2018-04-30 DIAGNOSIS — E11622 Type 2 diabetes mellitus with other skin ulcer: Secondary | ICD-10-CM | POA: Diagnosis not present

## 2018-04-30 DIAGNOSIS — I872 Venous insufficiency (chronic) (peripheral): Secondary | ICD-10-CM | POA: Diagnosis not present

## 2018-04-30 DIAGNOSIS — J449 Chronic obstructive pulmonary disease, unspecified: Secondary | ICD-10-CM | POA: Diagnosis not present

## 2018-04-30 DIAGNOSIS — L97819 Non-pressure chronic ulcer of other part of right lower leg with unspecified severity: Secondary | ICD-10-CM | POA: Diagnosis not present

## 2018-04-30 DIAGNOSIS — E1140 Type 2 diabetes mellitus with diabetic neuropathy, unspecified: Secondary | ICD-10-CM | POA: Diagnosis not present

## 2018-04-30 DIAGNOSIS — E1136 Type 2 diabetes mellitus with diabetic cataract: Secondary | ICD-10-CM | POA: Diagnosis not present

## 2018-04-30 DIAGNOSIS — I509 Heart failure, unspecified: Secondary | ICD-10-CM | POA: Diagnosis not present

## 2018-04-30 DIAGNOSIS — I87311 Chronic venous hypertension (idiopathic) with ulcer of right lower extremity: Secondary | ICD-10-CM | POA: Diagnosis not present

## 2018-04-30 DIAGNOSIS — I890 Lymphedema, not elsewhere classified: Secondary | ICD-10-CM | POA: Diagnosis not present

## 2018-05-04 ENCOUNTER — Other Ambulatory Visit: Payer: Self-pay | Admitting: Internal Medicine

## 2018-05-05 NOTE — Telephone Encounter (Signed)
Where does she want the pregabalin sent?

## 2018-05-05 NOTE — Telephone Encounter (Signed)
Pt requesting Lyrica rx sent to [facility]. Called [facility] - stated Lyrica cannot be sent electronically, can be faxed or called in.

## 2018-05-06 ENCOUNTER — Ambulatory Visit: Payer: Self-pay

## 2018-05-06 ENCOUNTER — Other Ambulatory Visit: Payer: Self-pay | Admitting: Internal Medicine

## 2018-05-06 ENCOUNTER — Telehealth: Payer: Self-pay | Admitting: *Deleted

## 2018-05-06 MED ORDER — PREGABALIN 200 MG PO CAPS: ORAL_CAPSULE | ORAL | 3 refills | Status: DC

## 2018-05-06 MED ORDER — PREGABALIN 200 MG PO CAPS: ORAL_CAPSULE | ORAL | 1 refills | Status: DC

## 2018-05-06 NOTE — Telephone Encounter (Signed)
Epic will not allow electronic Rx to [facility]. Would you pls phone in? thanks

## 2018-05-06 NOTE — Telephone Encounter (Signed)
Called lyrica to [facility], can only do 1 refill, script will expire in 6 months, please change medlist to reflect, thanks!

## 2018-05-06 NOTE — Telephone Encounter (Signed)
done

## 2018-05-07 ENCOUNTER — Ambulatory Visit (INDEPENDENT_AMBULATORY_CARE_PROVIDER_SITE_OTHER): Payer: Medicare Other | Admitting: Internal Medicine

## 2018-05-07 ENCOUNTER — Encounter: Payer: Self-pay | Admitting: Internal Medicine

## 2018-05-07 VITALS — BP 174/60 | HR 67 | Temp 98.2°F | Wt 271.2 lb

## 2018-05-07 DIAGNOSIS — F17210 Nicotine dependence, cigarettes, uncomplicated: Secondary | ICD-10-CM | POA: Diagnosis not present

## 2018-05-07 DIAGNOSIS — B370 Candidal stomatitis: Secondary | ICD-10-CM

## 2018-05-07 MED ORDER — MAGIC MOUTHWASH W/LIDOCAINE: 10.0000 mL | Freq: Three times a day (TID) | ORAL | 3 refills | Status: AC | PRN

## 2018-05-07 NOTE — Assessment & Plan Note (Signed)
Patient uses Advair, has infrequent but recurrent episodes of oral thrush.  This episode is particularly bothersome for the patient.  It is very uncomfortable feeling in her mouth.    -Ordered patient Magic mouthwash with lidocaine 10 mL 3 times daily

## 2018-05-07 NOTE — Progress Notes (Signed)
CC: oral candidiasis  HPI:  [name] is a 57 y.o. female with PMH below.  She is here to address another episode of oral candidiasis.    Please see A&P for status of the patient's chronic medical conditions  Past Medical History:  Diagnosis Date  . Anginal pain [facility])    '3' of 10 ischemia ruled out 9/9   . Arthritis of lumbar spine   . Asthma   . CHF (congestive heart failure) ([place])   . 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 Chronic bronchitis ([place])    "I get it alot" (09/28/2013)  . Chronic diastolic heart failure ([facility])    grade 2 per 2D echocardiogram (01/2013)  . Chronic lower back pain   . Chronic osteomyelitis of foot ([facility])    chronic, right secondary to diabetic foot ulcers  . Chronic pain syndrome 12/03/2011   Likely secondary to depression, "fibromyalgia", neuropathy, and obesity. Lumbar MRI 2014 no sig change from prior (2008) : Stable hypertrophic facet disease most notable at L4-5. Stable shallow left foraminal/extraforaminal disc protrusion at L4-5. No direct neural compression.      Marland Kitchen COPD 01/08/2007   PFT's 05/2007 : FEV1/FVC 82, FEV1 64% pred, FEF 25-75% 40% predicted, 16% improvement in FEV1 with bronchodilators.     . Depression   . Diabetic peripheral neuropathy ([place])   . DVT of upper extremity (deep vein thrombosis) ([place]) 03/11/2013   Secondary to PICC line. Right brachial vein, diagnosed on 03/10/2013 Coumadin for 3 months. End date 06/10/2013   . Environmental allergies    Hx: of  . Exertional shortness of breath   . Fatty liver 2003   observed on ultrasound abdomen  . Fibromyalgia   . GERD (gastroesophageal reflux disease)   . Glaucoma   . Hyperlipidemia   . Hyperplastic colon polyp 12/2010   Per colonoscopy (12/2010) - Dr. Deatra Ina  . Hypertension   . 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 Blood cultures 01/26/13 S. Aureus and GBS. Blood cultures Feb 6th, 8th, and 9th  and March were negative.Repeat TEE 3/20 negative for vegitations  . 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 2/2 tobacco use, DM, and cont weight bearing on surgical wound and developed gangrene   . Polymicrobial bacterial infection 01/2013   GBS and S. aureus bacteremia // Source likely infected diabetic foot ulcer  . PVD (peripheral vascular disease) with claudication ([place])    Stents to bilateral common iliac arteries (left 2005, right 2008), on chronic plavix  . Rheumatoid arthritis ([place])   . Tobacco abuse   . Type II diabetes mellitus with peripheral circulatory disorders, uncontrolled DX: 1993   Insulin dep. Poor control. Complicated by diabetic foot ulcer and diabetic eye disease.    Marland Kitchen Ulcer of foot, chronic (HCC)    Left. No OM per MRI (01/2013)   Review of Systems:  ROS: Pulmonary: pt denies increased work of breathing, shortness of breath,  Cardiac: pt denies palpitations, chest pain,  Abdominal: pt denies abdominal pain, nausea, vomiting, or diarrhea  Physical Exam:  Vitals:   05/07/18 1349  BP: (!) 174/60  Pulse: 67  Temp: 98.2 F (36.8 C)  TempSrc: Oral  SpO2: 94%  Weight: 271 lb 3.2 oz (123 kg)   Physical Exam  Constitutional: No distress.  HENT:  edentulous oral cavity.  White plaques seen mainly  on upper gumline with surrounding mild erythema.    Cardiovascular: Normal rate, regular rhythm and normal heart sounds. Exam reveals no gallop and no friction rub.  No murmur heard. Pulmonary/Chest: Effort normal and breath sounds normal. No respiratory distress. She has no wheezes. She has no rales. She exhibits no tenderness.  Abdominal: Soft. Bowel sounds are normal. She exhibits no distension and no mass. There is no tenderness. There is no rebound and no guarding.  Neurological: She is alert.  Skin: She is not diaphoretic.    Social History   Socioeconomic History  . Marital status: Divorced     Spouse name: Not on file  . Number of children: 2  . Years of education: college  . Highest education level: Not on file  Occupational History  . Occupation: Disability    Comment: previously worked as a Civil engineer, contracting  . Financial resource strain: Not on file  . Food insecurity:    Worry: Not on file    Inability: Not on file  . Transportation needs:    Medical: Not on file    Non-medical: Not on file  Tobacco Use  . Smoking status: Current Every Day Smoker    Packs/day: 1.00    Years: 44.00    Pack years: 44.00    Types: Cigarettes  . Smokeless tobacco: Never Used  . Tobacco comment: 1PPD  Substance and Sexual Activity  . Alcohol use: No    Alcohol/week: 0.0 oz  . Drug use: No    Types: Marijuana, "Crack" cocaine    Comment: 09/28/2013 "no marijuana since 04/08/2010, no crack/cocaine 1989"  . Sexual activity: Not Currently  Lifestyle  . Physical activity:    Days per week: Not on file    Minutes per session: Not on file  . Stress: Not on file  Relationships  . Social connections:    Talks on phone: Not on file    Gets together: Not on file    Attends religious service: Not on file    Active member of club or organization: Not on file    Attends meetings of clubs or organizations: Not on file    Relationship status: Not on file  . Intimate partner violence:    Fear of current or ex partner: Not on file    Emotionally abused: Not on file    Physically abused: Not on file    Forced sexual activity: Not on file  Other Topics Concern  . Not on file  Social History Narrative   On disability. Lives with son in [place]. Formerly worked as Training and development officer.    Boyfriend passed away stage 4 cancer 04/08/13.   S/p L BKA 2013/04/08. In wheelchair in paritially suitable apartment.     Family History  Problem Relation Age of Onset  . Diverticulosis Mother   . Diabetes Mother   . Hypertension Mother   . Congestive Heart Failure Mother   . Asthma Father   . CAD Sister 15        MI at age 77 per patient.  However, she has not had a stent or CABG.   . Heart disease Sister        before age 26    Assessment & Plan:   See Encounters Tab for problem based charting.  Patient discussed with Dr. Angelia Mould

## 2018-05-07 NOTE — Patient Instructions (Addendum)
Please take this magic mouthwash as directed.  I have put in a refill for this medicine in case this problem arises again.     Oral Thrush, Adult Oral thrush is an infection in your mouth and throat. It causes white patches on your tongue and in your mouth. Follow these instructions at home: Helping with soreness  To lessen your pain: ? Drink cold liquids, like water and iced tea. ? Eat frozen ice pops or frozen juices. ? Eat foods that are easy to swallow, like gelatin and ice cream. ? Drink from a straw if the patches in your mouth are painful. General instructions   Take or use over-the-counter and prescription medicines only as told by your doctor. Medicine for oral thrush may be something to swallow, or it may be something to put on the infected area.  Eat plain yogurt that has live cultures in it. Read the label to make sure.  If you wear dentures: ? Take out your dentures before you go to bed. ? Brush them well. ? Soak them in a denture cleaner.  Rinse your mouth with warm salt-water many times a day. To make the salt-water mixture, completely dissolve 1/2-1 teaspoon of salt in 1 cup of warm water. Contact a doctor if:  Your problems are getting worse.  Your problems do not get better in less than 7 days with treatment.  Your infection is spreading. This may show as white patches on the skin outside of your mouth.  You are nursing your baby and you have redness and pain in the nipples. This information is not intended to replace advice given to you by your health care provider. Make sure you discuss any questions you have with your health care provider. Document Released: 03/04/2010 Document Revised: 09/01/2016 Document Reviewed: 09/01/2016 Elsevier Interactive Patient Education  2017 [employer].

## 2018-05-10 NOTE — Progress Notes (Signed)
Internal Medicine Clinic Attending  Case discussed with Dr. Winfrey  at the time of the visit.  We reviewed the resident's history and exam and pertinent patient test results.  I agree with the assessment, diagnosis, and plan of care documented in the resident's note.  

## 2018-05-11 ENCOUNTER — Telehealth: Payer: Self-pay | Admitting: Internal Medicine

## 2018-05-11 NOTE — Telephone Encounter (Signed)
Refill Request-Pt states she can no afford her medication because it is over $40.00 and her insurance will not pay for it.   magic mouthwash w/lidocaine SOLN

## 2018-05-13 ENCOUNTER — Ambulatory Visit (INDEPENDENT_AMBULATORY_CARE_PROVIDER_SITE_OTHER): Payer: Medicare Other | Admitting: Dietician

## 2018-05-13 ENCOUNTER — Ambulatory Visit (INDEPENDENT_AMBULATORY_CARE_PROVIDER_SITE_OTHER): Payer: Medicare Other | Admitting: Internal Medicine

## 2018-05-13 ENCOUNTER — Other Ambulatory Visit: Payer: Self-pay

## 2018-05-13 ENCOUNTER — Encounter: Payer: Self-pay | Admitting: Dietician

## 2018-05-13 ENCOUNTER — Other Ambulatory Visit: Payer: Self-pay | Admitting: Dietician

## 2018-05-13 VITALS — BP 148/78 | HR 60 | Temp 97.5°F | Wt 268.4 lb

## 2018-05-13 DIAGNOSIS — N3941 Urge incontinence: Secondary | ICD-10-CM

## 2018-05-13 DIAGNOSIS — Z713 Dietary counseling and surveillance: Secondary | ICD-10-CM

## 2018-05-13 DIAGNOSIS — IMO0002 Reserved for concepts with insufficient information to code with codable children: Secondary | ICD-10-CM

## 2018-05-13 DIAGNOSIS — B370 Candidal stomatitis: Secondary | ICD-10-CM | POA: Diagnosis not present

## 2018-05-13 DIAGNOSIS — N3946 Mixed incontinence: Secondary | ICD-10-CM | POA: Insufficient documentation

## 2018-05-13 DIAGNOSIS — R350 Frequency of micturition: Secondary | ICD-10-CM | POA: Diagnosis not present

## 2018-05-13 DIAGNOSIS — I10 Essential (primary) hypertension: Secondary | ICD-10-CM

## 2018-05-13 DIAGNOSIS — M62838 Other muscle spasm: Secondary | ICD-10-CM

## 2018-05-13 DIAGNOSIS — E1159 Type 2 diabetes mellitus with other circulatory complications: Secondary | ICD-10-CM

## 2018-05-13 DIAGNOSIS — E1165 Type 2 diabetes mellitus with hyperglycemia: Secondary | ICD-10-CM

## 2018-05-13 DIAGNOSIS — L814 Other melanin hyperpigmentation: Secondary | ICD-10-CM

## 2018-05-13 DIAGNOSIS — K219 Gastro-esophageal reflux disease without esophagitis: Secondary | ICD-10-CM | POA: Diagnosis not present

## 2018-05-13 DIAGNOSIS — Z79899 Other long term (current) drug therapy: Secondary | ICD-10-CM

## 2018-05-13 DIAGNOSIS — Z6841 Body Mass Index (BMI) 40.0 and over, adult: Secondary | ICD-10-CM

## 2018-05-13 DIAGNOSIS — I776 Arteritis, unspecified: Secondary | ICD-10-CM | POA: Diagnosis not present

## 2018-05-13 DIAGNOSIS — R32 Unspecified urinary incontinence: Secondary | ICD-10-CM | POA: Diagnosis not present

## 2018-05-13 DIAGNOSIS — E1151 Type 2 diabetes mellitus with diabetic peripheral angiopathy without gangrene: Secondary | ICD-10-CM

## 2018-05-13 DIAGNOSIS — M549 Dorsalgia, unspecified: Secondary | ICD-10-CM

## 2018-05-13 DIAGNOSIS — Z86718 Personal history of other venous thrombosis and embolism: Secondary | ICD-10-CM

## 2018-05-13 DIAGNOSIS — Z90710 Acquired absence of both cervix and uterus: Secondary | ICD-10-CM

## 2018-05-13 DIAGNOSIS — Z79891 Long term (current) use of opiate analgesic: Secondary | ICD-10-CM | POA: Diagnosis not present

## 2018-05-13 DIAGNOSIS — M2550 Pain in unspecified joint: Secondary | ICD-10-CM

## 2018-05-13 DIAGNOSIS — R269 Unspecified abnormalities of gait and mobility: Secondary | ICD-10-CM

## 2018-05-13 DIAGNOSIS — R3915 Urgency of urination: Secondary | ICD-10-CM | POA: Diagnosis not present

## 2018-05-13 DIAGNOSIS — R21 Rash and other nonspecific skin eruption: Secondary | ICD-10-CM

## 2018-05-13 DIAGNOSIS — L299 Pruritus, unspecified: Secondary | ICD-10-CM

## 2018-05-13 DIAGNOSIS — L819 Disorder of pigmentation, unspecified: Secondary | ICD-10-CM

## 2018-05-13 MED ORDER — OMEPRAZOLE 20 MG PO CPDR: 20.0000 mg | DELAYED_RELEASE_CAPSULE | Freq: Two times a day (BID) | ORAL | 3 refills | Status: DC

## 2018-05-13 MED ORDER — HYDROXYZINE HCL 25 MG PO TABS: 25.0000 mg | ORAL_TABLET | Freq: Three times a day (TID) | ORAL | 1 refills | Status: DC | PRN

## 2018-05-13 MED ORDER — MIRABEGRON ER 25 MG PO TB24: 25.0000 mg | ORAL_TABLET | Freq: Every day | ORAL | 1 refills | Status: DC

## 2018-05-13 NOTE — Progress Notes (Signed)
   Subjective:    [name], female    DOB: 03-Dec-1961, 57 y.o.   MRN: [id number]  HPI  Jennifer [name] is here for skin bx results. Please see the A&P for the status of the pt's chronic medical problems.  ROS : per ROS section and in problem oriented charting. All other systems are negative.  PMHx, Soc hx, and / or Fam hx : still not with her own apartment.   Review of Systems  HENT:       Mouth pain, sore when eat and swallow  Genitourinary: Positive for urgency. Negative for difficulty urinating, dysuria and hematuria.  Musculoskeletal: Positive for arthralgias, back pain and gait problem.  Skin:       Skin rash and itching       Objective:   Physical Exam  Constitutional: She appears well-developed and well-nourished. No distress.  HENT:  Head: Normocephalic and atraumatic.  Right Ear: External ear normal.  Left Ear: External ear normal.  Mouth/Throat: Oropharynx is clear and moist.  Scant tiny thrush on hard palate R side but not diffuse.  Eyes: Conjunctivae are normal. Right eye exhibits no discharge. Left eye exhibits no discharge.  Musculoskeletal: She exhibits edema and tenderness.  Skin: Skin is warm and dry. She is not diaphoretic.  On left mid nose, 1cm x 1cm hyperpigmented, irregular borders, asymmetric macule. R forearm skin bx well healed.  Psychiatric: She has a normal mood and affect. Her behavior is normal. Judgment and thought content normal.      Assessment & Plan:

## 2018-05-13 NOTE — Assessment & Plan Note (Signed)
This problem is new.  Today she complains of one year history of worsening urinary incontinence.  She denies dysuria but does endorse urinary frequency.  She feels like she can fully empty her bladder.  She states that when she feels the need to urinate, the urine will come out before she has time to walk 5 feet to her bedside toilet.  She has to wear a bladder pad but is still leaking on floor or bed. She tells me that in 1997, when she was 35, her bladder was found to be "torn to shreds" during her TAH and they had to "make her a new bladder."  This   This sounds like urge incontinence and I am going to start mirabegron on 25 mg.  It may take several weeks for her to notice benefit and it may be titrated up if needed.  I have gone ahead and refer her to urology but have instructed her that she may cancel that appointment if the medication helps significantly.  I decided to refer her to urology due to her history of what sounds like a bladder repair operation.  PLAN : mirabegron 25, may titrate up to 50 if needed Urology referral

## 2018-05-13 NOTE — Assessment & Plan Note (Signed)
This problem is chronic and uncontrolled.  She was diagnosed with oral candidiasis at her recent clinic appointment.  Due to insurance interference, she has been unable to obtain her medications.  She states that she just got the medications.  She continues to have oral pain, pain with swallowing and eating.  PLAN:  Cont current meds - ketoconazole and Magic mouthwash

## 2018-05-13 NOTE — Assessment & Plan Note (Addendum)
This problem is chronic and controlled.  I have stopped her opioid taper as I felt she was on an appropriate dosage, balancing benefits and risk.  She is on oxycodone 5 mg acetaminophen 325 mg and gets 100 pills/month.  I am having her fill out a new contract since she will now be on a steady dose.  I am also getting a urine drug screen although there has been almost no reason to suspect any abnormal behaviors surrounding her controlled substance.  She continues to use baclofen for muscle spasms.  She states the spasms occur all over including her stomach muscles.  Her thigh muscles, and leg muscles.  She states this medication helps a lot.  PLAN:  Cont current meds

## 2018-05-13 NOTE — Assessment & Plan Note (Signed)
This problem is chronic and uncontrolled.  I had successfully gotten her off of a PPI and onto an H2 blocker.  However, today she states the ranitidine is not working and she is having GERD symptoms daily.  She was to resume her PPI twice daily.  PLAN : PPI BID on empty stomach Stop H2B

## 2018-05-13 NOTE — Patient Instructions (Addendum)
1. Try mirabegron once a day for your urinary incontinence. 2. I am referring you to urology and dermatology 3. I sent in refills to [facility] 4. I sent in the new stomach acid pill 5. See me in 3 months

## 2018-05-13 NOTE — Patient Instructions (Addendum)
Kegel Exercises Kegel exercises help strengthen the muscles that support the rectum, vagina, small intestine, bladder, and uterus. Doing Kegel exercises can help:  Improve bladder and bowel control.  Improve sexual response.  Reduce problems and discomfort during pregnancy.  Kegel exercises involve squeezing your pelvic floor muscles, which are the same muscles you squeeze when you try to stop the flow of urine. The exercises can be done while sitting, standing, or lying down, but it is best to vary your position. Phase 1 exercises 1. Squeeze your pelvic floor muscles tight. You should feel a tight lift in your rectal area. If you are a female, you should also feel a tightness in your vaginal area. Keep your stomach, buttocks, and legs relaxed. 2. Hold the muscles tight for up to 10 seconds. 3. Relax your muscles. Repeat this exercise 50 times a day or as many times as told by your health care provider. Continue to do this exercise for at least 4-6 weeks   Week 1- 10 kegels 3x/day ( 830 am when [employer] comes on TV, 1230 when stories come on channel 2,  7 Pm when [employer] comes on Week 2- 15 kegels 3x/day Week 3- 20 kegels 3x/day  Goal for the next 7 days is to drink TEA with less than your usual amount of sugar

## 2018-05-13 NOTE — Progress Notes (Signed)
Request referral for annual Diabetes self management training and Medical Nutrition Therpay

## 2018-05-13 NOTE — Assessment & Plan Note (Signed)
This problem is chronic and uncontrolled.  We reviewed her pathology results which suggested seborrheic keratosis but had changes that might be consistent with a vasculitis or a vasculopathy and suggested a coagulation work-up.  She had a provoked upper extremity DVT in 2014 when she had a PICC line in her right upper extremity.  She did complete a course of warfarin.  Otherwise, she has had no VTE events and her family history is negative for VTE.  Therefore, I think it is unlikely she has a coagulation disorder.  I did order an ANCA as a screen for vasculitis.  I am more worried today about the hyperpigmented macule on her left nose.  I had not noticed this before although she states that has been there for a year.  She is willing to see a dermatologist and I have referred her to dermatology for both the lesion on her nose and to get a second opinion about the spots she has noted all over her body.  PLAN : referral to derm

## 2018-05-13 NOTE — Progress Notes (Signed)
Medical Nutrition Therapy:  Appt start time: 0900 end time:  1000. Visit #  1st this year, last visit was 08/2016  Assessment:  Primary concerns today: weight loss, support Ms/ Brame was concerned about her urinary leakage, desire for bariatric surgery and places to live today. She smokes 3 cigarettes/day. States she has no support system, but stays in touch with her sons and father.   Preferred Learning Style: No preference indicated  Learning Readiness: Contemplating  ANTHROPOMETRICS: Estimated body mass index is 42.04 kg/m as calculated from the following:   Height as of 01/21/18: 5\' 7"  (1.702 m).   Weight as of an earlier encounter on 05/13/18: 268 lb 6.4 oz (121.7 kg).  WEIGHT HISTORY:  Wt Readings from Last 3 Encounters:  05/13/18 268 lb 6.4 oz (121.7 kg)  05/07/18 271 lb 3.2 oz (123 kg)  04/08/18 277 lb 6.4 oz (125.8 kg)   SLEEP:need to assess at future visit  MEDICATIONS:  Current Outpatient Medications on File Prior to Visit  Medication Sig Dispense Refill  . ADVAIR DISKUS 250-50 MCG/DOSE AEPB TAKE 1 INHALATION BY MOUTH TWICE DAILY (Patient not taking: Reported on 05/13/2018) 180 each 3  . albuterol (PROVENTIL) (2.5 MG/3ML) 0.083% nebulizer solution INHALE THE CONTENTS OF 1 VIAL VIA NEBULIZER EVERY 6 HOURS AS NEEDED FOR WHEEZING 360 mL 1  . amLODipine-benazepril (LOTREL) 10-40 MG capsule Take 1 capsule by mouth daily. 90 capsule 3  . atenolol (TENORMIN) 100 MG tablet TAKE 1 TABLET BY MOUTH EVERY DAY 90 tablet 3  . baclofen (LIORESAL) 10 MG tablet TAKE 1 TABLET BY MOUTH THREE TIMES A DAY AS NEEDED FOR MUSCLE SPASMS (Patient taking differently: TAKE 10 mg TABLET BY MOUTH THREE TIMES A DAY AS NEEDED FOR MUSCLE SPASMS) 90 tablet 1  . betamethasone dipropionate (DIPROLENE) 0.05 % cream Apply topically 2 (two) times daily. Do not use on face, skin folds, or areas with yeast infection. Wash hands after use. Use for only 2 wks 45 g 0  . buPROPion (WELLBUTRIN XL) 300 MG 24 hr tablet TAKE  1 TABLET BY MOUTH ONCE DAILY (Patient taking differently: TAKE 300 mg TABLET BY MOUTH ONCE DAILY) 90 tablet 3  . cholecalciferol (VITAMIN D) 1000 units tablet Take 1,000 Units by mouth daily.    . DULoxetine (CYMBALTA) 30 MG capsule Take 1 capsule (30 mg total) by mouth daily. 90 capsule 1  . fluticasone (FLONASE) 50 MCG/ACT nasal spray INSTILL 1 SPRAY IN EACH NOSTRIL DAILY 48 g 1  . furosemide (LASIX) 40 MG tablet TAKE 1 TABLET BY MOUTH DAILY AS NEEDED (Patient not taking: Reported on 05/13/2018) 90 tablet 1  . hydrochlorothiazide (HYDRODIURIL) 25 MG tablet TAKE 1 TABLET BY MOUTH EVERY DAY (Patient taking differently: TAKE 25 mg TABLET BY MOUTH EVERY DAY) 90 tablet 3  . hydrOXYzine (ATARAX/VISTARIL) 25 MG tablet Take 1 tablet (25 mg total) by mouth 3 (three) times daily as needed for itching. 90 tablet 1  . Insulin Lispro Prot & Lispro (HUMALOG MIX 75/25 KWIKPEN) (75-25) 100 UNIT/ML Kwikpen Inject 130 Units into the skin daily with breakfast AND 85 Units daily before supper. INJECT 130 UNITS SUBCUTANEOUSLY EVERY MORNING BEFORE BREAKFAST AND 85 UNITS BEFORE SUPPER. (Patient taking differently: Inject 130 Units into the skin daily with breakfast AND 85 Units daily before supper. INJECT 140 UNITS SUBCUTANEOUSLY EVERY MORNING BEFORE BREAKFAST AND 85 UNITS BEFORE SUPPER.) 135 mL 3  . ipratropium (ATROVENT) 0.02 % nebulizer solution USE 1 VIAL VIA NEBULIZER EVERY 6 HOURS AS NEEDED FOR WHEEZING  62.5 mL 4  . ketoconazole (NIZORAL) 2 % cream Apply 1 application topically daily. Apply to L stomach / thigh skin fold for 2 weeks 15 g 0  . magic mouthwash w/lidocaine SOLN Take 10 mLs by mouth 3 (three) times daily as needed for up to 14 days for mouth pain. 450 mL 3  . metFORMIN (GLUCOPHAGE-XR) 500 MG 24 hr tablet TAKE 1 TABLET BY MOUTH EVERY DAY WITH BREAKFAST 270 tablet 3  . mirabegron ER (MYRBETRIQ) 25 MG TB24 tablet Take 1 tablet (25 mg total) by mouth daily. 90 tablet 1  . omeprazole (PRILOSEC) 20 MG capsule  Take 1 capsule (20 mg total) by mouth 2 (two) times daily before a meal. 180 capsule 3  . ONETOUCH DELICA LANCETS 23N MISC USE TO CHECK BLOOD SUGARS FOUR TIMES A DAY 100 each 3  . ONETOUCH VERIO test strip USE 3-4 TIMES DAILY TO CHECK BLOOD SUGAR 100 each 5  . potassium chloride SA (K-DUR,KLOR-CON) 20 MEQ tablet TAKE 2 TABLETS BY MOUTH DAILY 180 tablet 0  . pregabalin (LYRICA) 200 MG capsule TAKE 1 CAPSULE BY MOUTH THREE TIMES A DAY 270 capsule 1  . rosuvastatin (CRESTOR) 20 MG tablet TAKE 1 TABLET BY MOUTH AT BEDTIME 90 tablet 3  . traZODone (DESYREL) 100 MG tablet TABLET BY MOUTH DAILY AT BEDTIME AS NEEDED FOR SLEEP 90 tablet 0  . vitamin B-12 (CYANOCOBALAMIN) 100 MCG tablet Take 100 mcg by mouth daily.    . vitamin C (ASCORBIC ACID) 500 MG tablet Take 500 mg by mouth daily.    . [DISCONTINUED] Potassium Chloride ER 20 MEQ TBCR Take 40 mEq by mouth daily. 30 tablet 0   No current facility-administered medications on file prior to visit.     BLOOD SUGAR: Lab Results  Component Value Date   HGBA1C 8.8 04/08/2018    DIETARY INTAKE: Not assessed in detail today.  24-hr recall: not done today Beverages: sprite 2-3 liters/day, sweet tea  Usual physical activity: sits in wheelchair most of day  Progress Towards Goal(s):  In progress.   Nutritional Diagnosis:  NB-1.6 Limited adherence to nutrition-related recommendations As related to competing values  As evidenced by her report.     Intervention:  Nutrition counseling and support for using less sugar in her beverages to help with weight loss/diabetes control and urinary leakage, encourage kegels and other activity and signing up for a bariatric webinar or class. Coordination of care: none Teaching Method Utilized: Visual, Auditory, Hands on Handouts given during visit include: avs Barriers to learning/adherence to lifestyle change: competing values Demonstrated degree of understanding via:  Teach Back   Monitoring/Evaluation:   Dietary intake, exercise,meter, and body weight in 1 week(s)  [name], RD 05/13/2018 12:16 PM. .

## 2018-05-13 NOTE — Assessment & Plan Note (Signed)
This problem is chronic and uncontrolled. She got the benazepril 40 and amlodipine 10 combo pill. Still taking atenolol 100, HCTZ 25. Not taking the lasix. It was controlled at her last appt, telling me that her regimen can control her BP. I am hesitant to increase her regimen for this reason. Cont to emphasize compliance.  PLAN:  Cont current meds

## 2018-05-14 LAB — MICROALBUMIN / CREATININE URINE RATIO
Creatinine, Urine: 27.7 mg/dL
Microalb/Creat Ratio: 528.2 mg/g creat — ABNORMAL HIGH (ref 0.0–30.0)
Microalbumin, Urine: 146.3 ug/mL

## 2018-05-14 LAB — MICROSCOPIC EXAMINATION: Casts: NONE SEEN /lpf

## 2018-05-14 LAB — URINALYSIS, ROUTINE W REFLEX MICROSCOPIC
Bilirubin, UA: NEGATIVE
Ketones, UA: NEGATIVE
Nitrite, UA: NEGATIVE
Specific Gravity, UA: 1.028 (ref 1.005–1.030)
Urobilinogen, Ur: 0.2 mg/dL (ref 0.2–1.0)
pH, UA: 5.5 (ref 5.0–7.5)

## 2018-05-14 LAB — MPO/PR-3 (ANCA) ANTIBODIES
ANCA Proteinase 3: <3.5 U/mL (ref 0.0–3.5)
Myeloperoxidase Ab: <9 U/mL (ref 0.0–9.0)

## 2018-05-19 NOTE — Telephone Encounter (Signed)
This has been resolved as of 05/13/2018 OV with PCP. [name], RN, BSN

## 2018-05-20 ENCOUNTER — Ambulatory Visit (INDEPENDENT_AMBULATORY_CARE_PROVIDER_SITE_OTHER): Payer: Medicare Other | Admitting: Dietician

## 2018-05-20 ENCOUNTER — Encounter: Payer: Self-pay | Admitting: Dietician

## 2018-05-20 DIAGNOSIS — E1165 Type 2 diabetes mellitus with hyperglycemia: Secondary | ICD-10-CM | POA: Diagnosis not present

## 2018-05-20 DIAGNOSIS — IMO0002 Reserved for concepts with insufficient information to code with codable children: Secondary | ICD-10-CM

## 2018-05-20 DIAGNOSIS — E1151 Type 2 diabetes mellitus with diabetic peripheral angiopathy without gangrene: Secondary | ICD-10-CM

## 2018-05-20 NOTE — Progress Notes (Signed)
Medical Nutrition Therapy:  Appt start time: 300 end time:  400. Visit #  2nd this year  Assessment:  Primary concerns today: weight loss, blood sugar control and support Ms/ Butikofer was assisted in registering for bariatric surgery seminar. She reports drinking more water and less sugar in her tea and enjoying it that way.  Unfortunately, she did not bring her meter, has not been checking her blood sugar as often as she'd like because she needs new lancing device.   ANTHROPOMETRICS: Estimated body mass index is 42.57 kg/m as calculated from the following:   Height as of 01/21/18: 5\' 7"  (1.702 m).   Weight as of this encounter: 271 lb 12.8 oz (123.3 kg).  WEIGHT HISTORY:  Wt Readings from Last 3 Encounters:  05/20/18 271 lb 12.8 oz (123.3 kg)  05/13/18 268 lb 6.4 oz (121.7 kg)  05/07/18 271 lb 3.2 oz (123 kg)   SLEEP:need to assess at future visit  MEDICATIONS:  Current Outpatient Medications on File Prior to Visit  Medication Sig Dispense Refill  . ADVAIR DISKUS 250-50 MCG/DOSE AEPB TAKE 1 INHALATION BY MOUTH TWICE DAILY (Patient not taking: Reported on 05/13/2018) 180 each 3  . albuterol (PROVENTIL) (2.5 MG/3ML) 0.083% nebulizer solution INHALE THE CONTENTS OF 1 VIAL VIA NEBULIZER EVERY 6 HOURS AS NEEDED FOR WHEEZING 360 mL 1  . amLODipine-benazepril (LOTREL) 10-40 MG capsule Take 1 capsule by mouth daily. 90 capsule 3  . atenolol (TENORMIN) 100 MG tablet TAKE 1 TABLET BY MOUTH EVERY DAY 90 tablet 3  . baclofen (LIORESAL) 10 MG tablet TAKE 1 TABLET BY MOUTH THREE TIMES A DAY AS NEEDED FOR MUSCLE SPASMS (Patient taking differently: TAKE 10 mg TABLET BY MOUTH THREE TIMES A DAY AS NEEDED FOR MUSCLE SPASMS) 90 tablet 1  . betamethasone dipropionate (DIPROLENE) 0.05 % cream Apply topically 2 (two) times daily. Do not use on face, skin folds, or areas with yeast infection. Wash hands after use. Use for only 2 wks 45 g 0  . buPROPion (WELLBUTRIN XL) 300 MG 24 hr tablet TAKE 1 TABLET BY MOUTH  ONCE DAILY (Patient taking differently: TAKE 300 mg TABLET BY MOUTH ONCE DAILY) 90 tablet 3  . cholecalciferol (VITAMIN D) 1000 units tablet Take 1,000 Units by mouth daily.    . DULoxetine (CYMBALTA) 30 MG capsule Take 1 capsule (30 mg total) by mouth daily. 90 capsule 1  . fluticasone (FLONASE) 50 MCG/ACT nasal spray INSTILL 1 SPRAY IN EACH NOSTRIL DAILY 48 g 1  . furosemide (LASIX) 40 MG tablet TAKE 1 TABLET BY MOUTH DAILY AS NEEDED (Patient not taking: Reported on 05/13/2018) 90 tablet 1  . hydrochlorothiazide (HYDRODIURIL) 25 MG tablet TAKE 1 TABLET BY MOUTH EVERY DAY (Patient taking differently: TAKE 25 mg TABLET BY MOUTH EVERY DAY) 90 tablet 3  . hydrOXYzine (ATARAX/VISTARIL) 25 MG tablet Take 1 tablet (25 mg total) by mouth 3 (three) times daily as needed for itching. 90 tablet 1  . Insulin Lispro Prot & Lispro (HUMALOG MIX 75/25 KWIKPEN) (75-25) 100 UNIT/ML Kwikpen Inject 130 Units into the skin daily with breakfast AND 85 Units daily before supper. INJECT 130 UNITS SUBCUTANEOUSLY EVERY MORNING BEFORE BREAKFAST AND 85 UNITS BEFORE SUPPER. (Patient taking differently: Inject 130 Units into the skin daily with breakfast AND 85 Units daily before supper. INJECT 140 UNITS SUBCUTANEOUSLY EVERY MORNING BEFORE BREAKFAST AND 85 UNITS BEFORE SUPPER.) 135 mL 3  . ipratropium (ATROVENT) 0.02 % nebulizer solution USE 1 VIAL VIA NEBULIZER EVERY 6 HOURS AS NEEDED  FOR WHEEZING 62.5 mL 4  . ketoconazole (NIZORAL) 2 % cream Apply 1 application topically daily. Apply to L stomach / thigh skin fold for 2 weeks 15 g 0  . magic mouthwash w/lidocaine SOLN Take 10 mLs by mouth 3 (three) times daily as needed for up to 14 days for mouth pain. 450 mL 3  . metFORMIN (GLUCOPHAGE-XR) 500 MG 24 hr tablet TAKE 1 TABLET BY MOUTH EVERY DAY WITH BREAKFAST 270 tablet 3  . mirabegron ER (MYRBETRIQ) 25 MG TB24 tablet Take 1 tablet (25 mg total) by mouth daily. 90 tablet 1  . omeprazole (PRILOSEC) 20 MG capsule Take 1 capsule (20  mg total) by mouth 2 (two) times daily before a meal. 180 capsule 3  . ONETOUCH DELICA LANCETS 22Q MISC USE TO CHECK BLOOD SUGARS FOUR TIMES A DAY 100 each 3  . ONETOUCH VERIO test strip USE 3-4 TIMES DAILY TO CHECK BLOOD SUGAR 100 each 5  . potassium chloride SA (K-DUR,KLOR-CON) 20 MEQ tablet TAKE 2 TABLETS BY MOUTH DAILY 180 tablet 0  . pregabalin (LYRICA) 200 MG capsule TAKE 1 CAPSULE BY MOUTH THREE TIMES A DAY 270 capsule 1  . rosuvastatin (CRESTOR) 20 MG tablet TAKE 1 TABLET BY MOUTH AT BEDTIME 90 tablet 3  . traZODone (DESYREL) 100 MG tablet TABLET BY MOUTH DAILY AT BEDTIME AS NEEDED FOR SLEEP 90 tablet 0  . vitamin B-12 (CYANOCOBALAMIN) 100 MCG tablet Take 100 mcg by mouth daily.    . vitamin C (ASCORBIC ACID) 500 MG tablet Take 500 mg by mouth daily.    . [DISCONTINUED] Potassium Chloride ER 20 MEQ TBCR Take 40 mEq by mouth daily. 30 tablet 0   No current facility-administered medications on file prior to visit.     BLOOD SUGAR: Lab Results  Component Value Date   HGBA1C 8.8 04/08/2018    DIETARY INTAKE: Not assessed in detail today.  24-hr recall: not done today.  Beverages: sprite 2-3 liters/day, less sweet tea  Usual physical activity: sits in wheelchair most of day  Progress Towards Goal(s):  In progress.   Nutritional Diagnosis:  NB-1.6 Limited adherence to nutrition-related recommendations As related to competing values  As evidenced by her report.     Intervention:  Nutrition counseling and support for using a phone app to record blood sugars and possibly learn cars in her food. assistance with weight loss surgery registration scheduling yearly eye exam and mamogram Coordination of care: none Teaching Method Utilized: Visual, Auditory, Hands on Handouts given during visit include: avs, carb counting book, new one touch meter Barriers to learning/adherence to lifestyle change: competing values Demonstrated degree of understanding via:  Teach Back    Monitoring/Evaluation:  Dietary intake, exercise,meter, and body weight in 1 week(s)  [name], RD 05/20/2018 4:42 PM. .

## 2018-05-20 NOTE — Patient Instructions (Addendum)
To do:  1- Call about weight loss surgery:   2- Go to Eye exam on Tuesday July 2nd at 1:30 PM Check to see if the vision part of your plan has a separate ID Number  call them with the new [phone] [facility]- [address], [place]  3- Mammogram will ask Dr [name] for a referral  your plan to help lower blood sugars is to check blood sugars twice or more a day for the next week.

## 2018-05-22 LAB — TOXASSURE SELECT,+ANTIDEPR,UR

## 2018-05-25 ENCOUNTER — Other Ambulatory Visit: Payer: Self-pay | Admitting: Internal Medicine

## 2018-05-25 DIAGNOSIS — Z1231 Encounter for screening mammogram for malignant neoplasm of breast: Secondary | ICD-10-CM

## 2018-05-27 ENCOUNTER — Ambulatory Visit (INDEPENDENT_AMBULATORY_CARE_PROVIDER_SITE_OTHER): Payer: Medicare Other | Admitting: Dietician

## 2018-05-27 ENCOUNTER — Encounter: Payer: Self-pay | Admitting: Dietician

## 2018-05-27 ENCOUNTER — Other Ambulatory Visit: Payer: Self-pay

## 2018-05-27 ENCOUNTER — Ambulatory Visit (INDEPENDENT_AMBULATORY_CARE_PROVIDER_SITE_OTHER): Payer: Medicare Other | Admitting: Internal Medicine

## 2018-05-27 ENCOUNTER — Encounter: Payer: Self-pay | Admitting: Internal Medicine

## 2018-05-27 DIAGNOSIS — Z89512 Acquired absence of left leg below knee: Secondary | ICD-10-CM | POA: Diagnosis not present

## 2018-05-27 DIAGNOSIS — Z4689 Encounter for fitting and adjustment of other specified devices: Secondary | ICD-10-CM | POA: Diagnosis not present

## 2018-05-27 DIAGNOSIS — E1151 Type 2 diabetes mellitus with diabetic peripheral angiopathy without gangrene: Secondary | ICD-10-CM

## 2018-05-27 DIAGNOSIS — Z89519 Acquired absence of unspecified leg below knee: Secondary | ICD-10-CM

## 2018-05-27 DIAGNOSIS — Z9714 Presence of artificial left leg (complete) (partial): Secondary | ICD-10-CM

## 2018-05-27 DIAGNOSIS — E1165 Type 2 diabetes mellitus with hyperglycemia: Principal | ICD-10-CM

## 2018-05-27 DIAGNOSIS — E119 Type 2 diabetes mellitus without complications: Secondary | ICD-10-CM | POA: Diagnosis not present

## 2018-05-27 DIAGNOSIS — Z713 Dietary counseling and surveillance: Secondary | ICD-10-CM | POA: Diagnosis not present

## 2018-05-27 DIAGNOSIS — IMO0002 Reserved for concepts with insufficient information to code with codable children: Secondary | ICD-10-CM

## 2018-05-27 DIAGNOSIS — Z6841 Body Mass Index (BMI) 40.0 and over, adult: Secondary | ICD-10-CM | POA: Diagnosis not present

## 2018-05-27 NOTE — Assessment & Plan Note (Signed)
Patient here today for evaluation for manual wheelchair.  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  A cane, walker, or crutch will not resolve her issues with performing activities of daily living.  Wheelchair will allow her to safely perform her daily activities.  Patient can safely propel the wheelchair in the home and has a caregiver (son) who can provide assistance.  Will order for new wheelchair today.

## 2018-05-27 NOTE — Patient Instructions (Addendum)
God job getting to the metabolic surgery seminar.  Goals for this week:   1- find a place to stay  2- finish papers for weight loss surgery  3- Check blood sugars two times a day and put phone near meter  4- Call to find out about your referral to Urology: [facility] [address] [place], [place] Location Hours:    8:00 a. m. - 5:00 p. m. Phone:  [phone]

## 2018-05-27 NOTE — Progress Notes (Signed)
CC: wheelchair evaluation  HPI:  [name] is a 57 y.o. female with a past medical history listed below here today for evaluation of wheelchair.  For details of today's visit and the status of her chronic medical issues please refer to the assessment and plan.   Past Medical History:  Diagnosis Date  . Anginal pain [facility])    '3' of 10 ischemia ruled out 9/9   . Arthritis of lumbar spine   . Asthma   . CHF (congestive heart failure) ([place])   . 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 Chronic bronchitis ([place])    "I get it alot" (09/28/2013)  . Chronic diastolic heart failure (HCC)    grade 2 per 2D echocardiogram (01/2013)  . Chronic lower back pain   . Chronic osteomyelitis of foot (HCC)    chronic, right secondary to diabetic foot ulcers  . Chronic pain syndrome 12/03/2011   Likely secondary to depression, "fibromyalgia", neuropathy, and obesity. Lumbar MRI 2014 no sig change from prior (2008) : Stable hypertrophic facet disease most notable at L4-5. Stable shallow left foraminal/extraforaminal disc protrusion at L4-5. No direct neural compression.      Marland Kitchen COPD 01/08/2007   PFT's 05/2007 : FEV1/FVC 82, FEV1 64% pred, FEF 25-75% 40% predicted, 16% improvement in FEV1 with bronchodilators.     . Depression   . Diabetic peripheral neuropathy ([place])   . DVT of upper extremity (deep vein thrombosis) ([place]) 03/11/2013   Secondary to PICC line. Right brachial vein, diagnosed on 03/10/2013 Coumadin for 3 months. End date 06/10/2013   . Environmental allergies    Hx: of  . Exertional shortness of breath   . Fatty liver 2003   observed on ultrasound abdomen  . Fibromyalgia   . GERD (gastroesophageal reflux disease)   . Glaucoma   . Hyperlipidemia   . Hyperplastic colon polyp 12/2010   Per colonoscopy (12/2010) - Dr. Deatra Ina  . Hypertension   . 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 Blood cultures 01/26/13 S.  Aureus and GBS. Blood cultures Feb 6th, 8th, and 9th and March were negative.Repeat TEE 3/20 negative for vegitations  . 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 2/2 tobacco use, DM, and cont weight bearing on surgical wound and developed gangrene   . Polymicrobial bacterial infection 01/2013   GBS and S. aureus bacteremia // Source likely infected diabetic foot ulcer  . PVD (peripheral vascular disease) with claudication ([place])    Stents to bilateral common iliac arteries (left 2005, right 2008), on chronic plavix  . Rheumatoid arthritis ([place])   . Tobacco abuse   . Type II diabetes mellitus with peripheral circulatory disorders, uncontrolled DX: 1993   Insulin dep. Poor control. Complicated by diabetic foot ulcer and diabetic eye disease.    Marland Kitchen Ulcer of foot, chronic (HCC)    Left. No OM per MRI (01/2013)   Review of Systems:   No chest pain or shortness of breath  Physical Exam:  Vitals:   05/27/18 1445  BP: (!) 158/73  Pulse: 73  Temp: 97.7 F (36.5 C)  TempSrc: Oral  SpO2: 97%  Weight: 275 lb 11.2 oz (125.1 kg)  Height: 5\' 7"  (1.702 m)   GENERAL- alert, co-operative, obese female sitting in wheelchair CARDIAC- RRR, no murmurs, rubs or gallops. RESP- clear to auscultation bilaterally, no wheezes or crackles.  EXTREMITIES-left BKA with prosthesis in place SKIN- Warm, dry, No rash or lesion. PSYCH- Normal mood and affect, appropriate thought content and speech.  Assessment & Plan:   See Encounters Tab for problem based charting.  Patient discussed with Dr. Evette Doffing

## 2018-05-27 NOTE — Progress Notes (Signed)
Medical Nutrition Therapy:  Appt start time: 300 end time:  325. Visit #  3rd this year  Assessment:  Primary concerns today: weight loss, blood sugar control and support Ms. Salvaggio attended the bariatric surgery seminar and is excited about it. She reports having checked her blood sugar 1-2 times a day for the past week, but unfortunately, she did not bring her meter for review. She reports they have been high. She'd like her a1c to be a "6".   ANTHROPOMETRICS: Estimated body mass index is 43.18 kg/m as calculated from the following:   Height as of an earlier encounter on 05/27/18: 5\' 7"  (1.702 m).   Weight as of an earlier encounter on 05/27/18: 275 lb 11.2 oz (125.1 kg).  WEIGHT HISTORY:  Wt Readings from Last 3 Encounters:  05/27/18 275 lb 11.2 oz (125.1 kg)  05/20/18 271 lb 12.8 oz (123.3 kg)  05/13/18 268 lb 6.4 oz (121.7 kg)   SLEEP:need to assess at future visit  MEDICATIONS:  Current Outpatient Medications on File Prior to Visit  Medication Sig Dispense Refill  . ADVAIR DISKUS 250-50 MCG/DOSE AEPB TAKE 1 INHALATION BY MOUTH TWICE DAILY (Patient not taking: Reported on 05/13/2018) 180 each 3  . albuterol (PROVENTIL) (2.5 MG/3ML) 0.083% nebulizer solution INHALE THE CONTENTS OF 1 VIAL VIA NEBULIZER EVERY 6 HOURS AS NEEDED FOR WHEEZING 360 mL 1  . amLODipine-benazepril (LOTREL) 10-40 MG capsule Take 1 capsule by mouth daily. 90 capsule 3  . atenolol (TENORMIN) 100 MG tablet TAKE 1 TABLET BY MOUTH EVERY DAY 90 tablet 3  . baclofen (LIORESAL) 10 MG tablet TAKE 1 TABLET BY MOUTH THREE TIMES A DAY AS NEEDED FOR MUSCLE SPASMS (Patient taking differently: TAKE 10 mg TABLET BY MOUTH THREE TIMES A DAY AS NEEDED FOR MUSCLE SPASMS) 90 tablet 1  . betamethasone dipropionate (DIPROLENE) 0.05 % cream Apply topically 2 (two) times daily. Do not use on face, skin folds, or areas with yeast infection. Wash hands after use. Use for only 2 wks 45 g 0  . buPROPion (WELLBUTRIN XL) 300 MG 24 hr tablet TAKE  1 TABLET BY MOUTH ONCE DAILY (Patient taking differently: TAKE 300 mg TABLET BY MOUTH ONCE DAILY) 90 tablet 3  . cholecalciferol (VITAMIN D) 1000 units tablet Take 1,000 Units by mouth daily.    . DULoxetine (CYMBALTA) 30 MG capsule Take 1 capsule (30 mg total) by mouth daily. 90 capsule 1  . fluticasone (FLONASE) 50 MCG/ACT nasal spray INSTILL 1 SPRAY IN EACH NOSTRIL DAILY 48 g 1  . furosemide (LASIX) 40 MG tablet TAKE 1 TABLET BY MOUTH DAILY AS NEEDED (Patient not taking: Reported on 05/13/2018) 90 tablet 1  . hydrochlorothiazide (HYDRODIURIL) 25 MG tablet TAKE 1 TABLET BY MOUTH EVERY DAY (Patient taking differently: TAKE 25 mg TABLET BY MOUTH EVERY DAY) 90 tablet 3  . hydrOXYzine (ATARAX/VISTARIL) 25 MG tablet Take 1 tablet (25 mg total) by mouth 3 (three) times daily as needed for itching. 90 tablet 1  . Insulin Lispro Prot & Lispro (HUMALOG MIX 75/25 KWIKPEN) (75-25) 100 UNIT/ML Kwikpen Inject 130 Units into the skin daily with breakfast AND 85 Units daily before supper. INJECT 130 UNITS SUBCUTANEOUSLY EVERY MORNING BEFORE BREAKFAST AND 85 UNITS BEFORE SUPPER. (Patient taking differently: Inject 130 Units into the skin daily with breakfast AND 85 Units daily before supper. INJECT 140 UNITS SUBCUTANEOUSLY EVERY MORNING BEFORE BREAKFAST AND 85 UNITS BEFORE SUPPER.) 135 mL 3  . ipratropium (ATROVENT) 0.02 % nebulizer solution USE 1 VIAL VIA  NEBULIZER EVERY 6 HOURS AS NEEDED FOR WHEEZING 62.5 mL 4  . ketoconazole (NIZORAL) 2 % cream Apply 1 application topically daily. Apply to L stomach / thigh skin fold for 2 weeks 15 g 0  . metFORMIN (GLUCOPHAGE-XR) 500 MG 24 hr tablet TAKE 1 TABLET BY MOUTH EVERY DAY WITH BREAKFAST 270 tablet 3  . mirabegron ER (MYRBETRIQ) 25 MG TB24 tablet Take 1 tablet (25 mg total) by mouth daily. 90 tablet 1  . omeprazole (PRILOSEC) 20 MG capsule Take 1 capsule (20 mg total) by mouth 2 (two) times daily before a meal. 180 capsule 3  . ONETOUCH DELICA LANCETS 09X MISC USE TO  CHECK BLOOD SUGARS FOUR TIMES A DAY 100 each 3  . ONETOUCH VERIO test strip USE 3-4 TIMES DAILY TO CHECK BLOOD SUGAR 100 each 5  . potassium chloride SA (K-DUR,KLOR-CON) 20 MEQ tablet TAKE 2 TABLETS BY MOUTH DAILY 180 tablet 0  . pregabalin (LYRICA) 200 MG capsule TAKE 1 CAPSULE BY MOUTH THREE TIMES A DAY 270 capsule 1  . rosuvastatin (CRESTOR) 20 MG tablet TAKE 1 TABLET BY MOUTH AT BEDTIME 90 tablet 3  . traZODone (DESYREL) 100 MG tablet TABLET BY MOUTH DAILY AT BEDTIME AS NEEDED FOR SLEEP 90 tablet 0  . vitamin B-12 (CYANOCOBALAMIN) 100 MCG tablet Take 100 mcg by mouth daily.    . vitamin C (ASCORBIC ACID) 500 MG tablet Take 500 mg by mouth daily.    . [DISCONTINUED] Potassium Chloride ER 20 MEQ TBCR Take 40 mEq by mouth daily. 30 tablet 0   No current facility-administered medications on file prior to visit.     BLOOD SUGAR: Lab Results  Component Value Date   HGBA1C 8.8 04/08/2018    DIETARY INTAKE: Not assessed in detail today.  24-hr recall: not done today.  Beverages: sprite 2-3 liters/day, less sweet tea  Usual physical activity: sits in wheelchair most of day  Progress Towards Goal(s):  In progress.   Nutritional Diagnosis:  NB-1.6 Limited adherence to nutrition-related recommendations As related to competing values  As evidenced by her report.     Intervention:  Nutrition counseling and support for exploring her ambivalence in caring fr her daibetes and achieving her goals.  Coordination of care: none Teaching Method Utilized: Visual, Auditory, Hands on Handouts given during visit include: avs, carb counting book, new one touch meter Barriers to learning/adherence to lifestyle change: competing values Demonstrated degree of understanding via:  Teach Back   Monitoring/Evaluation:  Dietary intake, exercise,meter, and body weight in 1 week(s)  [name], RD 05/27/2018 3:57 PM. .

## 2018-05-27 NOTE — Patient Instructions (Signed)
Ms. Fomby,   We will work on getting you a new manual wheelchair. We will put the order in today and get the process started. Please follow up with your regular doctor as usual.

## 2018-05-28 NOTE — Progress Notes (Signed)
Internal Medicine Clinic Attending  Case discussed with Dr. Boswell at the time of the visit.  We reviewed the resident's history and exam and pertinent patient test results.  I agree with the assessment, diagnosis, and plan of care documented in the resident's note.  

## 2018-06-03 ENCOUNTER — Ambulatory Visit: Payer: Self-pay | Admitting: Dietician

## 2018-06-07 ENCOUNTER — Other Ambulatory Visit: Payer: Self-pay

## 2018-06-07 ENCOUNTER — Other Ambulatory Visit: Payer: Self-pay | Admitting: Internal Medicine

## 2018-06-07 NOTE — Patient Outreach (Signed)
[facility]) Care Management  06/07/2018  Jennifer [name] [birth year] [id number]   Medication Adherence call to Jennifer [name] spoke with patient she said she is expecting a deliver today on Amlodepine/Benazepril 10/40 Jennifer [name] is due for this medication.Jennifer [name] is showing past due under [place].  [place] Management Direct Dial [phone]  Fax [phone] [email or web address]

## 2018-06-15 ENCOUNTER — Ambulatory Visit
Admission: RE | Admit: 2018-06-15 | Discharge: 2018-06-15 | Disposition: A | Discharge status: Home / Self Care | Payer: Medicare Other | Source: Ambulatory Visit | Attending: Internal Medicine | Admitting: Internal Medicine

## 2018-06-15 DIAGNOSIS — Z1231 Encounter for screening mammogram for malignant neoplasm of breast: Secondary | ICD-10-CM | POA: Diagnosis not present

## 2018-06-18 ENCOUNTER — Telehealth: Payer: Self-pay | Admitting: Dietician

## 2018-06-18 NOTE — Telephone Encounter (Signed)
Having trouble using the One touch Reveal app for her blood sugars. Appointment set for July 11 at 315 PM

## 2018-06-23 DIAGNOSIS — G4733 Obstructive sleep apnea (adult) (pediatric): Secondary | ICD-10-CM | POA: Diagnosis not present

## 2018-06-23 DIAGNOSIS — I38 Endocarditis, valve unspecified: Secondary | ICD-10-CM | POA: Diagnosis not present

## 2018-07-01 ENCOUNTER — Ambulatory Visit: Payer: Self-pay | Admitting: Dietician

## 2018-07-02 ENCOUNTER — Other Ambulatory Visit: Payer: Self-pay | Admitting: Internal Medicine

## 2018-07-02 DIAGNOSIS — I10 Essential (primary) hypertension: Principal | ICD-10-CM

## 2018-07-02 DIAGNOSIS — E1159 Type 2 diabetes mellitus with other circulatory complications: Secondary | ICD-10-CM

## 2018-07-13 DIAGNOSIS — L821 Other seborrheic keratosis: Secondary | ICD-10-CM | POA: Diagnosis not present

## 2018-07-13 DIAGNOSIS — L820 Inflamed seborrheic keratosis: Secondary | ICD-10-CM | POA: Diagnosis not present

## 2018-07-14 ENCOUNTER — Telehealth: Payer: Self-pay | Admitting: Internal Medicine

## 2018-07-14 NOTE — Telephone Encounter (Signed)
Nurse Case manager for [facility] requesting a call back in regards to patient stopping some of her medications (Fluid Pills).  Please call back.

## 2018-07-14 NOTE — Telephone Encounter (Signed)
rtc to [facility] case manager, she states pt told her she has quit taking her diuretics due to not being able to empty her bedside commode and not having help to do so also because of incontinence. She also is upset because her wh/ch does not fit thru all doorways and it is hard to navigate by herself. Pt also c/o needing housing because her son is being evicted soon and she lives w/ him. She states she cannot get assistance w/ housing because of past issues of bedbugs. Case manager wonders if dr Software engineer has any insight to these problems as this was only the 2nd time she had spoken to pt, she states she tried to gain more information and pt became very tearful and upset so she had to stop the conversation. Informed her I would call her back Friday after speaking w/ dr Software engineer, she was agreeable

## 2018-07-15 NOTE — Telephone Encounter (Signed)
Call me when you are in. Thanks

## 2018-07-16 NOTE — Telephone Encounter (Signed)
Spoke w/ dr Software engineer

## 2018-07-16 NOTE — Telephone Encounter (Signed)
Have tried to call pt, no answer 

## 2018-07-21 ENCOUNTER — Telehealth: Payer: Self-pay

## 2018-07-21 MED ORDER — OXYCODONE-ACETAMINOPHEN 5-325 MG PO TABS: 1.0000 | ORAL_TABLET | Freq: Three times a day (TID) | ORAL | 0 refills | Status: DC | PRN

## 2018-07-21 NOTE — Telephone Encounter (Signed)
rtc to pt went straight to vmail this time, lm for rtc

## 2018-07-21 NOTE — Telephone Encounter (Signed)
Refill request on Oxycodone @ [facility] on [address].

## 2018-07-21 NOTE — Telephone Encounter (Signed)
[facility] to [facility] Would you pls ask her permission to have a mental health provider call her - free, no cost. Pls sch appt [name] Aug or OCt pain F/U

## 2018-07-22 NOTE — Telephone Encounter (Signed)
Straight to vmail, lm for rtc 

## 2018-07-24 DIAGNOSIS — I38 Endocarditis, valve unspecified: Secondary | ICD-10-CM | POA: Diagnosis not present

## 2018-07-24 DIAGNOSIS — G4733 Obstructive sleep apnea (adult) (pediatric): Secondary | ICD-10-CM | POA: Diagnosis not present

## 2018-07-26 NOTE — Telephone Encounter (Signed)
Called again, no answer

## 2018-07-28 ENCOUNTER — Telehealth: Payer: Self-pay | Admitting: *Deleted

## 2018-07-28 NOTE — Telephone Encounter (Signed)
CALLED PATIENT LVM FOR [place] TO CALL [facility] TO SET UP HER APPOINTMENT WITH THEM TO CALL THE AT [phone].

## 2018-07-29 ENCOUNTER — Encounter: Payer: Self-pay | Admitting: *Deleted

## 2018-08-03 ENCOUNTER — Other Ambulatory Visit: Payer: Self-pay | Admitting: Internal Medicine

## 2018-08-04 MED ORDER — FLUTICASONE-SALMETEROL 250-50 MCG/DOSE IN AEPB: INHALATION_SPRAY | RESPIRATORY_TRACT | 3 refills | Status: DC

## 2018-08-11 DIAGNOSIS — L309 Dermatitis, unspecified: Secondary | ICD-10-CM | POA: Diagnosis not present

## 2018-08-11 DIAGNOSIS — L820 Inflamed seborrheic keratosis: Secondary | ICD-10-CM | POA: Diagnosis not present

## 2018-08-24 DIAGNOSIS — I38 Endocarditis, valve unspecified: Secondary | ICD-10-CM | POA: Diagnosis not present

## 2018-08-24 DIAGNOSIS — G4733 Obstructive sleep apnea (adult) (pediatric): Secondary | ICD-10-CM | POA: Diagnosis not present

## 2018-09-02 ENCOUNTER — Telehealth: Payer: Self-pay | Admitting: *Deleted

## 2018-09-02 NOTE — Telephone Encounter (Signed)
Thank you. She freq does not take her diuretic.

## 2018-09-02 NOTE — Telephone Encounter (Signed)
Call from [place], case manager at [facility] - stated she usually call pt about once a month. Pt told her she's in the process of moving but does not know where to; he son is looking for a place. Pt told her she is not taking her diuretics- she has leg edema but no SOB and has been falling daily. Refused to call for an appt b/c she does not have the money.stated she will refer the pt to their social worker for any assistance. She wanted pt's doctor to be aware.

## 2018-09-06 ENCOUNTER — Other Ambulatory Visit: Payer: Self-pay

## 2018-09-06 NOTE — Patient Outreach (Signed)
[facility]) Care Management  09/06/2018  Jennifer [name] [birth year] [id number]   Medication Adherence call to [name] left a message for patient to call back patient is due on Rosuvastatin 20 mg and Amlodepine/Benazepril 10/40 mg. [name] is showing past due under [place].   [place] Management Direct Dial [phone]  Fax [phone] [email or web address]

## 2018-09-14 ENCOUNTER — Telehealth: Payer: Self-pay | Admitting: *Deleted

## 2018-09-14 NOTE — Telephone Encounter (Addendum)
Geriatrics Task Force call I asked pt about her diabetes management. She stated her blood sugars are pretty good; she checks her BS 1-2 times a day. Yesterday morning it was 229. I asked about her diet; stated she's "not really" following a diabetic diet but she is taing her medication as prescribed. I asked about her blood pressure. She is taking her medication. She has a wrist BP monitor but "has not been set-up yet". Stated no problem refilling/getting her medications. Appt had not not been scheduled - so appt with Dr [name] scheduled for 10/31. Jennifer [name] had no appts on this day so she stated she will schedule one with her at a later time.

## 2018-09-15 ENCOUNTER — Other Ambulatory Visit: Payer: Self-pay | Admitting: Internal Medicine

## 2018-09-21 ENCOUNTER — Other Ambulatory Visit: Payer: Self-pay

## 2018-09-21 NOTE — Patient Outreach (Signed)
[facility]) Care Management  09/21/2018  Jennifer [name] 24-Dec-1960 [id number]   Medication Adherence call to [name] left a message for patient to call back patient is due on Rosuvastatin  20mg  and  Amlodepine Benazepril 10/40. [name] is showing past due under [place].  [place] Management Direct Dial [phone]  Fax [phone] [email or web address]

## 2018-09-23 DIAGNOSIS — I38 Endocarditis, valve unspecified: Secondary | ICD-10-CM | POA: Diagnosis not present

## 2018-09-23 DIAGNOSIS — G4733 Obstructive sleep apnea (adult) (pediatric): Secondary | ICD-10-CM | POA: Diagnosis not present

## 2018-09-30 ENCOUNTER — Other Ambulatory Visit: Payer: Self-pay | Admitting: Internal Medicine

## 2018-09-30 DIAGNOSIS — E1159 Type 2 diabetes mellitus with other circulatory complications: Secondary | ICD-10-CM

## 2018-09-30 DIAGNOSIS — I10 Essential (primary) hypertension: Principal | ICD-10-CM

## 2018-10-01 NOTE — Telephone Encounter (Signed)
Next appt scheduled 10/31 with PCP. 

## 2018-10-19 DIAGNOSIS — D489 Neoplasm of uncertain behavior, unspecified: Secondary | ICD-10-CM | POA: Diagnosis not present

## 2018-10-19 DIAGNOSIS — L820 Inflamed seborrheic keratosis: Secondary | ICD-10-CM | POA: Diagnosis not present

## 2018-10-19 DIAGNOSIS — L821 Other seborrheic keratosis: Secondary | ICD-10-CM | POA: Diagnosis not present

## 2018-10-21 ENCOUNTER — Encounter: Payer: Self-pay | Admitting: Internal Medicine

## 2018-10-21 ENCOUNTER — Ambulatory Visit (INDEPENDENT_AMBULATORY_CARE_PROVIDER_SITE_OTHER): Payer: Medicare Other | Admitting: Internal Medicine

## 2018-10-21 VITALS — BP 132/63 | HR 64 | Temp 97.7°F

## 2018-10-21 DIAGNOSIS — E11649 Type 2 diabetes mellitus with hypoglycemia without coma: Secondary | ICD-10-CM | POA: Diagnosis not present

## 2018-10-21 DIAGNOSIS — Z598 Other problems related to housing and economic circumstances: Secondary | ICD-10-CM

## 2018-10-21 DIAGNOSIS — F332 Major depressive disorder, recurrent severe without psychotic features: Secondary | ICD-10-CM

## 2018-10-21 DIAGNOSIS — R269 Unspecified abnormalities of gait and mobility: Secondary | ICD-10-CM

## 2018-10-21 DIAGNOSIS — Z79891 Long term (current) use of opiate analgesic: Secondary | ICD-10-CM

## 2018-10-21 DIAGNOSIS — Z794 Long term (current) use of insulin: Secondary | ICD-10-CM

## 2018-10-21 DIAGNOSIS — R32 Unspecified urinary incontinence: Secondary | ICD-10-CM

## 2018-10-21 DIAGNOSIS — E1151 Type 2 diabetes mellitus with diabetic peripheral angiopathy without gangrene: Secondary | ICD-10-CM | POA: Diagnosis not present

## 2018-10-21 DIAGNOSIS — IMO0002 Reserved for concepts with insufficient information to code with codable children: Secondary | ICD-10-CM

## 2018-10-21 DIAGNOSIS — E1165 Type 2 diabetes mellitus with hyperglycemia: Secondary | ICD-10-CM

## 2018-10-21 DIAGNOSIS — G479 Sleep disorder, unspecified: Secondary | ICD-10-CM

## 2018-10-21 DIAGNOSIS — M549 Dorsalgia, unspecified: Secondary | ICD-10-CM

## 2018-10-21 DIAGNOSIS — Z89612 Acquired absence of left leg above knee: Secondary | ICD-10-CM

## 2018-10-21 DIAGNOSIS — Z23 Encounter for immunization: Secondary | ICD-10-CM | POA: Diagnosis not present

## 2018-10-21 DIAGNOSIS — R21 Rash and other nonspecific skin eruption: Secondary | ICD-10-CM

## 2018-10-21 LAB — GLUCOSE, CAPILLARY: Glucose-Capillary: 61 mg/dL — ABNORMAL LOW (ref 70–99)

## 2018-10-21 LAB — POCT GLYCOSYLATED HEMOGLOBIN (HGB A1C): Hemoglobin A1C: 8.2 % — AB (ref 4.0–5.6)

## 2018-10-21 MED ORDER — INSULIN DEGLUDEC 200 UNIT/ML ~~LOC~~ SOPN: 60.0000 [IU] | PEN_INJECTOR | Freq: Every day | SUBCUTANEOUS | 2 refills | Status: DC

## 2018-10-21 MED ORDER — OXYCODONE-ACETAMINOPHEN 5-325 MG PO TABS: 1.0000 | ORAL_TABLET | Freq: Three times a day (TID) | ORAL | 0 refills | Status: DC | PRN

## 2018-10-21 NOTE — Assessment & Plan Note (Signed)
This problem is chronic and controlled.  She did not have a single complaint about pain today.  Before I tapered her oxycodone, pain was always a focus of every visit.  Currently, she is on oxycodone 5 mg and gets 100 every 30 days.  This is down from 360 tablets.  I do not have any immediate plans to taper further but will continue to assess at every visit.  PLAN : I refilled her oxycodone 5 mg 100 pills every 30 days and gave her a 55-month supply

## 2018-10-21 NOTE — Patient Instructions (Addendum)
1. Stop your 70/30 insulin 2. Start tresiba 60 units once a day 3. Dessie Coma will call you to set up consoling  4. I sent in your pain medicine

## 2018-10-21 NOTE — Assessment & Plan Note (Signed)
This problem is chronic and uncontrolled.  She is on 70/30 insulin and is listed as taking 130 units in the morning and 65 units at night.  However, she stated she took 35 units this morning.  What she tells me she takes is never the same amount as what she is prescribed.  She states she did eat this morning including a sandwich, fruit punch, and ice water.  She did feel hypoglycemic this morning.  She states that she had been 289 pounds and when she saw dermatology yesterday, she was 260 pounds.  We discussed that with weight loss, she may not need as much insulin.  I am very concerned about her hypoglycemia as she states she has been down in the 20s and remained conscious despite this severe hypoglycemia.  She does however still gets symptomatic.  Due to her chaotic home life and psychosocial economical stressors, I am never going to get tight diabetic control.  My first goal is to get rid of the hypoglycemia so I collaborated with our diabetes educator and we are stopping the 70/30 and starting her on Tresiba U2 60 units which is a weight-based for her.  I will have her come back in 1 month to satiate how she is doing on the [place].  PLAN : D/C 70/30 Tresiba U2 200 60 units Tolerate a high A1c in order to prevent hypoglycemia

## 2018-10-21 NOTE — Assessment & Plan Note (Addendum)
This problem is chronic and uncontrolled.  She has always refused referral due to the co-pays.  She is having a lot of chaos in her life at this time.  Due to the eviction, she is no longer eligible for succinate.  She tried to work with the [employer] and was told they could do nothing for her.  She bounced between her son's girlfriends ex-husbands house and other temporary arrangements and is currently living in a motel which costs a $55 at night with her 2 grown sons.  She states they do not work and are not contributing to the rent of the motel room.  Her sister would not allow her to live in her home which is their childhood home.  Her father who is 61 just recently married a woman who is only 16 years older than Jennifer [name] and they live in a 1 bedroom apartment so she cannot stay with him.  Finances are always an issue.  She does endorse that she does not always have enough money to eat and that is because she is losing weight.  She states that she has fallen every day for the past 2 weeks while transferring.  She would like to buy a new sleeve for her left stump but it cost $200 and she does not have the money.  Due to her urinary incontinence, she states she is constantly urinating on the floor and the urine has also gotten onto her sleeve for her stump which is why she needs a new one.  She does tell me that she has no co-pays on her medicines so she is taking all of them.  When I asked about suicidal ideation, she stated she would not tell me that because the last time she was honest I admitted her to the inpatient setting.  She states she tells herself that tomorrow will be a better day which is why she never does anything but then went on to say that she wakes up the next day and everything is just bad.  I did not get a sense that she had any active suicidal ideation.  She is agreeable to seeing a our integrated behavioral health counselor and I have referred her to [facility].  F/U  Jennifer [name]' recs

## 2018-10-21 NOTE — Progress Notes (Signed)
   Subjective:    [name], female    DOB: 16-Feb-1961, 57 y.o.   MRN: [id number]  HPI  Jennifer [name] is here for DM F/U. Please see the A&P for the status of the pt's chronic medical problems.  ROS : per ROS section and in problem oriented charting. All other systems are negative.  PMHx, Soc hx, and / or Fam hx : Living in a motel $55 daily with her sons.    Review of Systems  Endocrine:       Positive hypoglycemic symptoms  Musculoskeletal: Positive for arthralgias, back pain and gait problem.  Skin: Positive for rash.  Psychiatric/Behavioral: Positive for sleep disturbance.       Objective:   Physical Exam  Constitutional: She appears well-developed and well-nourished. No distress.  HENT:  Head: Normocephalic and atraumatic.  Nose: Nose normal.  Eyes: Right eye exhibits no discharge. Left eye exhibits no discharge. No scleral icterus.  Skin: Skin is dry. She is not diaphoretic. No pallor.  Psychiatric: She has a normal mood and affect. Her behavior is normal. Judgment and thought content normal.      Assessment & Plan:

## 2018-10-24 DIAGNOSIS — G4733 Obstructive sleep apnea (adult) (pediatric): Secondary | ICD-10-CM | POA: Diagnosis not present

## 2018-10-24 DIAGNOSIS — I38 Endocarditis, valve unspecified: Secondary | ICD-10-CM | POA: Diagnosis not present

## 2018-10-26 ENCOUNTER — Telehealth: Payer: Self-pay | Admitting: Licensed Clinical Social Worker

## 2018-10-26 NOTE — Telephone Encounter (Signed)
Patient was contacted to follow up on a recent referral for behavioral health services. Patient did not answer, but a voicemail was left for the patient.

## 2018-10-28 ENCOUNTER — Other Ambulatory Visit: Payer: Self-pay | Admitting: Internal Medicine

## 2018-10-29 NOTE — Telephone Encounter (Signed)
Rx for pregabalin called to [place] at [facility]. [name], RN, BSN

## 2018-10-29 NOTE — Telephone Encounter (Signed)
[facility] will not accept E scripts. Pls phone in

## 2018-11-04 ENCOUNTER — Other Ambulatory Visit: Payer: Self-pay | Admitting: Internal Medicine

## 2018-11-11 ENCOUNTER — Ambulatory Visit (INDEPENDENT_AMBULATORY_CARE_PROVIDER_SITE_OTHER): Payer: Medicare Other | Admitting: Internal Medicine

## 2018-11-11 ENCOUNTER — Ambulatory Visit: Payer: Medicare Other | Admitting: Licensed Clinical Social Worker

## 2018-11-11 ENCOUNTER — Ambulatory Visit (INDEPENDENT_AMBULATORY_CARE_PROVIDER_SITE_OTHER): Payer: Medicare Other | Admitting: Pharmacist

## 2018-11-11 VITALS — BP 137/68 | HR 93 | Temp 98.7°F | Wt 260.3 lb

## 2018-11-11 DIAGNOSIS — R350 Frequency of micturition: Secondary | ICD-10-CM

## 2018-11-11 DIAGNOSIS — R3915 Urgency of urination: Secondary | ICD-10-CM | POA: Diagnosis not present

## 2018-11-11 DIAGNOSIS — Z590 Homelessness: Secondary | ICD-10-CM

## 2018-11-11 DIAGNOSIS — Z89512 Acquired absence of left leg below knee: Secondary | ICD-10-CM

## 2018-11-11 DIAGNOSIS — Z9714 Presence of artificial left leg (complete) (partial): Secondary | ICD-10-CM

## 2018-11-11 DIAGNOSIS — I10 Essential (primary) hypertension: Secondary | ICD-10-CM | POA: Diagnosis not present

## 2018-11-11 DIAGNOSIS — Z794 Long term (current) use of insulin: Secondary | ICD-10-CM

## 2018-11-11 DIAGNOSIS — Z79899 Other long term (current) drug therapy: Secondary | ICD-10-CM

## 2018-11-11 DIAGNOSIS — Z89519 Acquired absence of unspecified leg below knee: Secondary | ICD-10-CM

## 2018-11-11 DIAGNOSIS — E1159 Type 2 diabetes mellitus with other circulatory complications: Secondary | ICD-10-CM | POA: Diagnosis not present

## 2018-11-11 DIAGNOSIS — F321 Major depressive disorder, single episode, moderate: Secondary | ICD-10-CM

## 2018-11-11 DIAGNOSIS — E1151 Type 2 diabetes mellitus with diabetic peripheral angiopathy without gangrene: Secondary | ICD-10-CM | POA: Diagnosis not present

## 2018-11-11 DIAGNOSIS — J42 Unspecified chronic bronchitis: Secondary | ICD-10-CM

## 2018-11-11 DIAGNOSIS — Z7951 Long term (current) use of inhaled steroids: Secondary | ICD-10-CM

## 2018-11-11 DIAGNOSIS — Z720 Tobacco use: Secondary | ICD-10-CM

## 2018-11-11 DIAGNOSIS — I152 Hypertension secondary to endocrine disorders: Secondary | ICD-10-CM

## 2018-11-11 DIAGNOSIS — E1165 Type 2 diabetes mellitus with hyperglycemia: Secondary | ICD-10-CM

## 2018-11-11 DIAGNOSIS — Z598 Other problems related to housing and economic circumstances: Secondary | ICD-10-CM

## 2018-11-11 DIAGNOSIS — E1121 Type 2 diabetes mellitus with diabetic nephropathy: Secondary | ICD-10-CM

## 2018-11-11 DIAGNOSIS — IMO0002 Reserved for concepts with insufficient information to code with codable children: Secondary | ICD-10-CM

## 2018-11-11 DIAGNOSIS — J449 Chronic obstructive pulmonary disease, unspecified: Secondary | ICD-10-CM

## 2018-11-11 DIAGNOSIS — F332 Major depressive disorder, recurrent severe without psychotic features: Secondary | ICD-10-CM

## 2018-11-11 LAB — GLUCOSE, CAPILLARY: Glucose-Capillary: 315 mg/dL — ABNORMAL HIGH (ref 70–99)

## 2018-11-11 MED ORDER — UMECLIDINIUM-VILANTEROL 62.5-25 MCG/INH IN AEPB: 1.0000 | INHALATION_SPRAY | Freq: Every day | RESPIRATORY_TRACT | 3 refills | Status: DC

## 2018-11-11 NOTE — Assessment & Plan Note (Signed)
This problem is chronic and controlled.  Her repeat blood pressure is 137/68 which is almost at goal.  She did not bring her medications today but is scheduled to be on benazepril 40 and amlodipine 10 combo pill, atenolol 100, and HCTZ 25.  PLAN:  Cont current meds   BP Readings from Last 3 Encounters:  11/11/18 (!) 147/71  10/21/18 132/63  05/27/18 (!) 158/73

## 2018-11-11 NOTE — Assessment & Plan Note (Addendum)
This problem is chronic and unstable.  She has had financial difficulties getting a sleeve for her left stump.  Chilon discovered that she has an outstanding bill with the medical device company which is why she is unable to get a sleeve. Chilon was able to broker an arrangement in which she pays $25 now, gets the sleeve and any other equipment, and then pays $5 a month towards her bill.  We are going to use $25 from our Christmas fund to cover the initial payment but the patient will be responsible for the monthly $5 going forward.  I have put in a DME order for the sleeve and supplies.  This problem is new.  She has a scab over her stump where the prosthesis is rubbing.  A clinic wound care nurse was not available.  She does not need dedicated outpatient wound care at the wound care center.  However, due to her poor diabetic control, PAD, and social situation, she needs close follow-up and I am ordering home health for wound care and medication management.  PLAN : Sleeve and supplies through [place] referral for wound care and medication management

## 2018-11-11 NOTE — Progress Notes (Signed)
   Subjective:    [name], female    DOB: 08/21/1961, 57 y.o.   MRN: [id number]  HPI  Jennifer [name] is here for DM F/U. Please see the A&P for the status of the pt's chronic medical problems.  ROS : per ROS section and in problem oriented charting. All other systems are negative.  PMHx, Soc hx, and / or Fam hx : Still living in [place]. Has lack of food.  Review of Systems  Constitutional: Positive for unexpected weight change.  HENT:       Pain L nostril, no bleeding, no trauma  Endocrine:       Diffuse hair loss (short hairs ? Breakage) after she removed her 'sew in" hair  Genitourinary: Positive for frequency and urgency.  Musculoskeletal: Positive for arthralgias, back pain and gait problem.  Skin: Positive for wound.  Psychiatric/Behavioral: Positive for dysphoric mood.       Objective:   Physical Exam  Constitutional: She appears well-developed and well-nourished. No distress.  HENT:  Head: Normocephalic and atraumatic.  Right Ear: External ear normal.  Left Ear: External ear normal.  Nose: Nose normal.  Hyperpigmentation L external nose but no bleeding, crusting L nare.  Eyes: Conjunctivae are normal. Right eye exhibits no discharge. Left eye exhibits no discharge. No scleral icterus.  Neurological: She is alert.  Skin: Skin is warm and dry. No rash noted. She is not diaphoretic. No erythema. No pallor.  On L stump there is a 1 cm x 7 mm excoriated / scabbed area with a wider area of subQ firmness. No erythema, no drainage.  Psychiatric: She has a normal mood and affect. Her behavior is normal. Judgment and thought content normal.      Assessment & Plan:

## 2018-11-11 NOTE — Assessment & Plan Note (Signed)
This problem is chronic and uncontrolled.  I have referred her to our in-house counselor Miquel Dunn he was unable to make contact with her.  The patient is going to wait to see if she could speak with Miquel Dunn today.  Her PHQ 9 score is over 20.  She refused to answer the last question about being better off dead or hurting herself because last appointment, she stated I admitted her after she admitted to suicidal thoughts.  She has never had active suicidal ideation and she has been on multiple antidepressants.  I have tried a long time to get her into psychiatry that she was unable to afford this.  I had also tried telehealth that she had access issues to a working telephone.  We will start with Miquel Dunn and progress from there.  PLAN : Referral to [facility] counseling

## 2018-11-11 NOTE — Assessment & Plan Note (Signed)
This problem is chronic and uncontrolled.  She returns today after stopping her 70/30 insulin last appointment and starting Tresiba U200 at 60 units a day.  This change was made because she was having significant and symptomatic hypoglycemia.  I have discussed with her that my first goal was to get her to the hypoglycemia and that she likely would experience hyperglycemia.  Today, she complains of hyperglycemia but denies any hypoglycemia.  Her CBG log only has 4 glucose readings which are ranged between 170 and 451.  I cannot find any other logs in epic to compare but she has never had good diabetic control.  She has significant psychosocial economical issues that are preventing control of her chronic medical issues.  She is essentially homeless and is living in a motel and does not have access to food or cooking facilities.  She is losing weight and that is documented in the EHR.  Her mental health issues are not controlled.  She does not drive and her sons do not drive so she does not have access to a pharmacy and relies on mail order pharmacies or access to a food pantry.  I do not feel comfortable increasing her [name] as she has CBGs at 170 and inconsistent access to food.  I do not feel comfortable adding short acting insulin at this time due to recent severe symptomatic hypoglycemia.  In this situation, I likely will need to accept hyperglycemia to prevent hypoglycemia and except poor control.  She is agreeable to a continuous glucose monitor which we are going to place today and have her return in 1 week to see [facility] and either Dr. Maudie Mercury pharmacy or [name] diabetes educator.  PLAN:  Cont current meds CGM

## 2018-11-11 NOTE — Patient Instructions (Signed)
1. Please return in 1 week with [place] or Dr Maudie Mercury  for [facility] #1.  2. Please schedule an appt with Miquel Dunn 3. Urology sent in new urine pill

## 2018-11-11 NOTE — Patient Instructions (Addendum)
It was nice seeing you today!  1. STOP Wixela and START Anoro 1 puff once daily.  2. Continue using albuterol inhaler PRN 3. Call pharmacy clinic if any changes in breathing occur.

## 2018-11-11 NOTE — Progress Notes (Signed)
Jennifer [name] is a 57 y.o. female reports to clinical pharmacist appointment for COPD medication management. Patient did  bring medications to visit.   Patient's current COPD medication regimen consists of: Wixela (fluticasone-salmeterol 250-50mg ), ProAir PRN  Patient Actions reports COPD related symptoms. Symptoms include chest pain, breathing heavy. Patient reports stress contributes to breathing difficulty.   Patient Actions reports  use of rescue inhaler with an estimated use of about 2-3 times per day.   Patient reports 0 COPD exacerbation(s) in the past year.  Reports a history of smoking with a current pack-per year of (20 cigarettes/day) Patient reports she has tried Chantix in the past and that it did not help. She also expresses she is not interested in quitting at this time.   Allergies  Allergen Reactions  . Abilify [Aripiprazole] Other (See Comments)    Urinary freq Nov 2016  . Iohexol      Desc: IV CONTRAST CAUSE NEPHROPATHY IN 2007   . Ivp Dye [Iodinated Diagnostic Agents]   . Morphine Sulfate Itching and Rash   Prior to Admission medications   Medication Sig Start Date End Date Taking? Authorizing Provider  albuterol (PROVENTIL) (2.5 MG/3ML) 0.083% nebulizer solution INHALE THE CONTENTS OF 1 VIAL VIA NEBULIZER EVERY 6 HOURS AS NEEDED FOR WHEEZING 08/04/18   Bartholomew Crews, MD  amLODipine-benazepril (LOTREL) 10-40 MG capsule Take 1 capsule by mouth daily. 04/08/18   Bartholomew Crews, MD  atenolol (TENORMIN) 100 MG tablet TAKE 1 TABLET BY MOUTH EVERY DAY 05/05/18   Bartholomew Crews, MD  baclofen (LIORESAL) 10 MG tablet TAKE 1 TABLET BY MOUTH THREE TIMES A DAY AS NEEDED FOR MUSCLE SPASMS 07/05/18   Bartholomew Crews, MD  betamethasone dipropionate (DIPROLENE) 0.05 % cream Apply topically 2 (two) times daily. Do not use on face, skin folds, or areas with yeast infection. Wash hands after use. Use for only 2 wks 02/24/18   Bartholomew Crews, MD  buPROPion  (WELLBUTRIN XL) 300 MG 24 hr tablet TAKE 1 TABLET BY MOUTH ONCE DAILY Patient taking differently: TAKE 300 mg TABLET BY MOUTH ONCE DAILY 01/04/18   Bartholomew Crews, MD  cholecalciferol (VITAMIN D) 1000 units tablet Take 1,000 Units by mouth daily.    [provider]  DULoxetine (CYMBALTA) 30 MG capsule TAKE 1 CAPSULE BY MOUTH EVERY DAY 09/15/18   Bartholomew Crews, MD  EASY TOUCH PEN NEEDLES 31G X 8 MM MISC USE TO INJECT INSULIN TWICE DAILY 08/04/18   Bartholomew Crews, MD  fesoterodine (TOVIAZ) 8 MG TB24 tablet Take 1 tablet (8 mg total) by mouth daily. 11/04/18   Bartholomew Crews, MD  fluticasone Asencion Islam) 50 MCG/ACT nasal spray INSTILL 1 SPRAY IN EACH NOSTRIL DAILY 06/08/18   Bartholomew Crews, MD  Fluticasone-Salmeterol (ADVAIR DISKUS) 250-50 MCG/DOSE AEPB TAKE 1 INHALATION BY MOUTH TWICE DAILY 08/04/18   Bartholomew Crews, MD  furosemide (LASIX) 40 MG tablet TAKE ONE TABLET BY MOUTH DAILY AS NEEDED 07/05/18   Bartholomew Crews, MD  HUMALOG MIX 75/25 KWIKPEN (75-25) 100 UNIT/ML Kwikpen INJECT 130 UNITS SUBCUTANEOUSLY EVERY MORNING BEFORE BREAKFAST AND 85 UNITS BEFORE SUPPER 08/04/18   Bartholomew Crews, MD  hydrochlorothiazide (HYDRODIURIL) 25 MG tablet TAKE 1 TABLET BY MOUTH ONCE DAILY 10/01/18   Bartholomew Crews, MD  hydrOXYzine (ATARAX/VISTARIL) 25 MG tablet TAKE 1 TABLET (25 MG TOTAL) BY MOUTH 3 (THREE) TIMES DAILY AS NEEDED FOR ITCHING. 07/05/18   Bartholomew Crews, MD  Insulin Degludec (TRESIBA  FLEXTOUCH) 200 UNIT/ML SOPN Inject 60 Units into the skin daily. 10/21/18   Bartholomew Crews, MD  ipratropium (ATROVENT) 0.02 % nebulizer solution USE 1 VIAL VIA NEBULIZER EVERY 6 HOURS AS NEEDED FOR WHEEZING 08/04/18   Bartholomew Crews, MD  ketoconazole (NIZORAL) 2 % cream Apply 1 application topically daily. Apply to L stomach / thigh skin fold for 2 weeks 04/08/18   Bartholomew Crews, MD  metFORMIN (GLUCOPHAGE-XR) 500 MG 24 hr tablet TAKE 1 TABLET BY  MOUTH EVERY DAY WITH BREAKFAST 05/05/18   Bartholomew Crews, MD  mirabegron ER (MYRBETRIQ) 50 MG TB24 tablet Take 1 tablet (50 mg total) by mouth daily. 10/29/18   Bartholomew Crews, MD  NYSTATIN powder APPLY TO AFFECTED AREA THREE TIMES A DAY 08/04/18   Bartholomew Crews, MD  omeprazole (PRILOSEC) 20 MG capsule Take 1 capsule (20 mg total) by mouth 2 (two) times daily before a meal. 05/13/18   Bartholomew Crews, MD  [facility] DELICA LANCETS 61Y MISC USE TO CHECK BLOOD SUGARS FOUR TIMES A DAY 05/05/18   Bartholomew Crews, MD  [facility] VERIO test strip USE 3-4 TIMES DAILY TO CHECK BLOOD SUGAR 10/01/18   Bartholomew Crews, MD  oxyCODONE-acetaminophen (PERCOCET/ROXICET) 5-325 MG tablet Take 1 tablet by mouth every 8 (eight) hours as needed. May take a 4th pill if severe pain (up to 10 days monthly) #100 equals 30 day supply 10/21/18 11/20/18  Bartholomew Crews, MD  potassium chloride SA (K-DUR,KLOR-CON) 20 MEQ tablet TAKE 2 TABLETS BY MOUTH DAILY 05/26/16   Bartholomew Crews, MD  pregabalin (LYRICA) 200 MG capsule TAKE 1 CAPSULE BY MOUTH THREE TIMES A DAY 10/29/18   Bartholomew Crews, MD  rosuvastatin (CRESTOR) 20 MG tablet TAKE 1 TABLET BY MOUTH AT BEDTIME 05/05/18   Bartholomew Crews, MD  traZODone (DESYREL) 100 MG tablet TAKE ONE TABLET BY MOUTH DAILY AT BEDTIME AS NEEDED FOR SLEEP 06/08/18   Bartholomew Crews, MD  vitamin B-12 (CYANOCOBALAMIN) 100 MCG tablet Take 100 mcg by mouth daily.    [provider]  vitamin C (ASCORBIC ACID) 500 MG tablet Take 500 mg by mouth daily.    [provider]  Potassium Chloride ER 20 MEQ TBCR Take 40 mEq by mouth daily. 01/02/14 01/23/14  Othella Boyer, MD   Past Medical History:  Diagnosis Date  . Anginal pain [facility])    '3' of 10 ischemia ruled out 9/9   . Arthritis of lumbar spine   . Asthma   . CHF (congestive heart failure) ([place])   . 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 Chronic bronchitis ([place])    "I  get it alot" (09/28/2013)  . Chronic diastolic heart failure (HCC)    grade 2 per 2D echocardiogram (01/2013)  . Chronic lower back pain   . Chronic osteomyelitis of foot (HCC)    chronic, right secondary to diabetic foot ulcers  . Chronic pain syndrome 12/03/2011   Likely secondary to depression, "fibromyalgia", neuropathy, and obesity. Lumbar MRI 2014 no sig change from prior (2008) : Stable hypertrophic facet disease most notable at L4-5. Stable shallow left foraminal/extraforaminal disc protrusion at L4-5. No direct neural compression.      Marland Kitchen COPD 01/08/2007   PFT's 05/2007 : FEV1/FVC 82, FEV1 64% pred, FEF 25-75% 40% predicted, 16% improvement in FEV1 with bronchodilators.     . Depression   . Diabetic peripheral neuropathy ([place])   . DVT of upper  extremity (deep vein thrombosis) ([place]) 03/11/2013   Secondary to PICC line. Right brachial vein, diagnosed on 03/10/2013 Coumadin for 3 months. End date 06/10/2013   . Environmental allergies    Hx: of  . Exertional shortness of breath   . Fatty liver 04-05-2002   observed on ultrasound abdomen  . Fibromyalgia   . GERD (gastroesophageal reflux disease)   . Glaucoma   . Hyperlipidemia   . Hyperplastic colon polyp 12/2010   Per colonoscopy (12/2010) - Dr. Deatra Ina  . Hypertension   . 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 Blood cultures 01/26/13 S. Aureus and GBS. Blood cultures Feb 6th, 8th, and 9th and 06-Apr-2023 were negative.Repeat TEE 3/20 negative for vegitations  . 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 2/2 tobacco use, DM, and cont weight bearing on surgical wound and developed gangrene   . Polymicrobial bacterial infection 01/2013   GBS and S. aureus bacteremia // Source likely infected diabetic foot ulcer  . PVD (peripheral vascular disease) with claudication ([place])    Stents to bilateral common iliac arteries (left 05-Apr-2004,  right April 06, 2007), on chronic plavix  . Rheumatoid arthritis ([place])   . Tobacco abuse   . Type II diabetes mellitus with peripheral circulatory disorders, uncontrolled DX: 1993   Insulin dep. Poor control. Complicated by diabetic foot ulcer and diabetic eye disease.    Marland Kitchen Ulcer of foot, chronic (HCC)    Left. No OM per MRI (01/2013)   Social History   Socioeconomic History  . Marital status: Divorced    Spouse name: Not on file  . Number of children: 2  . Years of education: college  . Highest education level: Not on file  Occupational History  . Occupation: Disability    Comment: previously worked as a Civil engineer, contracting  . Financial resource strain: Very hard  . Food insecurity:    Worry: Often true    Inability: Often true  . Transportation needs:    Medical: No    Non-medical: Yes  Tobacco Use  . Smoking status: Current Every Day Smoker    Packs/day: 1.00    Years: 44.00    Pack years: 44.00    Types: Cigarettes  . Smokeless tobacco: Never Used  . Tobacco comment: 1PPD  Substance and Sexual Activity  . Alcohol use: No    Alcohol/week: 0.0 standard drinks  . Drug use: No    Types: Marijuana, "Crack" cocaine    Comment: 09/28/2013 "no marijuana since 04/05/2010, no crack/cocaine 1989"  . Sexual activity: Not Currently  Lifestyle  . Physical activity:    Days per week: Not on file    Minutes per session: Not on file  . Stress: Not on file  Relationships  . Social connections:    Talks on phone: Not on file    Gets together: Not on file    Attends religious service: Not on file    Active member of club or organization: Not on file    Attends meetings of clubs or organizations: Not on file    Relationship status: Not on file  Other Topics Concern  . Not on file  Social History Narrative   On disability. Lives with son in [place]. Formerly worked as Training and development officer.    Boyfriend passed away stage 4 cancer 04/05/2013.   S/p L BKA Apr 05, 2013. In wheelchair in paritially suitable apartment.  Family History  Problem Relation Age of Onset  . Diverticulosis Mother   . Diabetes Mother   . Hypertension Mother   . Congestive Heart Failure Mother   . Asthma Father   . CAD Sister 44       MI at age 74 per patient.  However, she has not had a stent or CABG.   . Heart disease Sister        before age 54  . Breast cancer Neg Hx     O: Ht Readings from Last 2 Encounters:  05/27/18 5\' 7"  (1.702 m)  01/21/18 5\' 7"  (1.702 m)   Wt Readings from Last 2 Encounters:  11/11/18 260 lb 4.8 oz (118.1 kg)  05/27/18 275 lb 11.2 oz (125.1 kg)   There is no height or weight on file to calculate BMI.  CAT ASSESSMENT  Rank each of the following items on a scale of 0 to 5 (with 5 being most severe) Write a # 0-5 in each box  I never cough (0) > I cough all the time (5) 4  I have no phlegm (mucus) in my chest (0) > My chest is completely full of phlegm (mucus) (5) 5  My chest does not feel tight at all (0) > My chest feels very tight (5) 3  When I walk up a hill or one flight of stairs I am not breathless (0) > When I walk up a hill or one flight of stairs I am very breathless (5) 5  I am not limited doing any activities at home (0) > I am very limited doing activities at home (5) 4  I am confident leaving my home despite my lung function (0) > I am not at all confident leaving my home because of my lung condition (5)  0  I sleep soundly (0) > I don't sleep soundly because of my lung condition (5) 4  I have lots of energy (0) > I have no energy at all (5) 5   Flu shot   Total CAT Score: 30   Most recent blood eosinophil count was 0.2 K/uL cells/microL taken on 01/14/2017.   A/P:  Findings/Recommendations:  . Based on a review of the patient's current COPD medications and symptoms, it is appropriate to de-prescribe ICS therapy.  . Will STOP Wixela and initiate Anoro 62.5/25mcg to optimize COPD management. Counseled patient on proper use and possible side effects of the medication.   . Educated patient on proper use of inhalers, importance of medication adherence and identified other risk factors for COPD exacerbation.  . Patient has received flu shot for this year.  . Counseled patient to report any changes in COPD medications, breathing, or shortness of breath.  . Counseled the patient importance of quitting tobacco use. Will re-evaluate readiness to quit at next visit.  . Will obtain blood eosinophil count at today's visit. Continued appropriateness of ICS de-prescribing will be evaluated after eosinophil count value results.  Follow up phone call scheduled for 12/02/18.  An after visit summary was provided and patient advised to follow up in 3 months with Pharmacy Clinic - will make this appointment when we follow up with patient in one month via phone.    The patient verbalized understanding of information provided by repeating back concepts discussed.  Gwenlyn Found, Sherian Rein D PGY1 Pharmacy Resident  Phone [phone] 11/11/2018   12:54 PM

## 2018-11-12 LAB — CBC WITH DIFFERENTIAL/PLATELET
Basophils Absolute: 0.1 10*3/uL (ref 0.0–0.2)
Basos: 1 %
EOS (ABSOLUTE): 0.1 10*3/uL (ref 0.0–0.4)
Eos: 2 %
Hematocrit: 40.7 % (ref 34.0–46.6)
Hemoglobin: 13.5 g/dL (ref 11.1–15.9)
Immature Grans (Abs): 0 10*3/uL (ref 0.0–0.1)
Immature Granulocytes: 0 %
Lymphocytes Absolute: 4.3 10*3/uL — ABNORMAL HIGH (ref 0.7–3.1)
Lymphs: 47 %
MCH: 31.5 pg (ref 26.6–33.0)
MCHC: 33.2 g/dL (ref 31.5–35.7)
MCV: 95 fL (ref 79–97)
Monocytes Absolute: 0.4 10*3/uL (ref 0.1–0.9)
Monocytes: 4 %
Neutrophils Absolute: 4.2 10*3/uL (ref 1.4–7.0)
Neutrophils: 46 %
Platelets: 155 10*3/uL (ref 150–450)
RBC: 4.28 x10E6/uL (ref 3.77–5.28)
RDW: 13.8 % (ref 12.3–15.4)
WBC: 9.1 10*3/uL (ref 3.4–10.8)

## 2018-11-12 NOTE — Assessment & Plan Note (Signed)
This problem is chronic and stable.  She has an official diagnosis of COPD although her FEV1/FVC ratio is not less than 70.  The official reading of her PFTs was moderately to severe airway obstruction.  I have never removed his COPD diagnosis despite her PFTs as she does smoke.  She has had no pulmonary exacerbations since 2017 and she was admitted for 2 COPD exacerbations.  Her regimen included Advair, albuterol nebs, albuterol MDI, Atrovent nebs.  She continues to smoke.  She was enrolled in the pharmacies COPD de-escalation program after we discussed whether she was appropriate due to the uncertain COPD diagnosis.  She was switched medications and now is on Anoro, albuterol, and Atrovent.  We will follow her response to this medication change.  PLAN : Stop Advair Start Anoro Continue albuterol and Atrovent

## 2018-11-15 ENCOUNTER — Encounter: Payer: Self-pay | Admitting: Licensed Clinical Social Worker

## 2018-11-15 NOTE — Progress Notes (Signed)
Documentation for Freestyle Libre Pro Continuous glucose monitoring Freestyle Libre Pro CGM sensor placed today. Patient was educated about wearing sensor, keeping food, activity and medication log and when to call office. Patient was educated about how to care for the sensor and not to have an MRI, CT or Diathermy while wearing the sensor. Follow up was arranged with the patient for 1 week.   Kathyrn Sheriff, Student-PharmD 11/15/2018 9:21 AM.

## 2018-11-15 NOTE — BH Specialist Note (Signed)
Integrated Behavioral Health Initial Visit  MRN: [id number] Name: Jennifer [name]  Number of [place] Clinician visits:: 1/6 Session Start time: 12:45  Session End time: 1:18 Total time: 33 minutes  Type of Service: [place] Interpretor:No.     Warm Hand Off Completed. Yes, by Dr. [name].       SUBJECTIVE: Jennifer [name] is a 57 y.o. female accompanied by whom attended the session individually.  Patient was referred by Dr. [name] for depression and sleep disturbances. Patient reports the following symptoms/concerns: Issues with housing, increased depression, issues adjusting to life circumstances, anxiety, issues with chronic health issues, and sleep disturbances.  Duration of problem: Increased since losing her housing in 2018 ; Severity of problem: moderate  OBJECTIVE: Mood: Anxious, Depressed and Hopeless and Affect: Tearful Risk of harm to self or others: No plan to harm self or others. Patient has no SI, HI, or thoughts of self-harm, however the patient is reporting moderate levels of feeling overwhelmed and hopeless due to recent changes in her financial and housing situation.   LIFE CONTEXT: Family and Social: Patient is currently living in a hotel with her two adult sons. Patient lost her section 8 housing several months ago, and has been unable to gain permeant housing since. Patient reported her family members are not currently helping her with housing. Patient is frustrated with her two adult sons, but is not willing to live without them.  School/Work: On disability.  Self-Care: Patient has difficulty with mobility and completing daily tasks. Patient reported she has lost 30 pounds in the past several months due to food insecurities.  Life Changes: Loss of housing and financial insecurities.   GOALS ADDRESSED: Patient will: 1. Reduce symptoms of: anxiety, depression and stress 2. Increase knowledge and/or  ability of: coping skills, healthy habits and locate resources that can assist with permanat housing.   3. Demonstrate ability to: Increase healthy adjustment to current life circumstances and Increase adequate support systems for patient/family  INTERVENTIONS: Interventions utilized: Solution-Focused Strategies, Supportive Counseling and Link to [employer]. Patient was provided with a list of housing resources. Therapist assessed the patient for any SI, HI, or self-harm due to level of depression.  Standardized Assessments completed: Assessed for any current SI, HI, or self-harm.   ASSESSMENT: Patient currently experiencing chronic pain, issues with housing and food insecurities, increased depression symptoms, anxiety, sleep disturbances, and lack of natural supports.    Patient may benefit from weekly outpatient therapy.  PLAN: 1. Follow up with behavioral health clinician on : one week.  2. Behavioral recommendations: Contact housing options provided in session.  3. Referral(s): [place] (In Clinic)   [place], [place], [place]

## 2018-11-16 ENCOUNTER — Ambulatory Visit: Payer: Medicare Other | Admitting: Licensed Clinical Social Worker

## 2018-11-16 ENCOUNTER — Ambulatory Visit: Payer: Medicare Other | Admitting: Pharmacist

## 2018-11-16 DIAGNOSIS — F321 Major depressive disorder, single episode, moderate: Secondary | ICD-10-CM

## 2018-11-16 DIAGNOSIS — E1121 Type 2 diabetes mellitus with diabetic nephropathy: Secondary | ICD-10-CM

## 2018-11-16 NOTE — Progress Notes (Signed)
Patient had a sensor placed 11/11/2018 that fell off after 2 days. A new sensor was placed today.   Documentation for Freestyle Libre Pro Continuous glucose monitoring Freestyle Libre Pro CGM sensor placed today. Patient was educated about wearing sensor, keeping food, activity and medication log and when to call office. Patient was educated about how to care for the sensor and not to have an MRI, CT or Diathermy while wearing the sensor. Follow up was arranged with the patient for 1 week.   Lot #: [id number] A Serial #: [id number] Expiration Date: 04/21/2019  The patient also voiced concerns to me about urinary incontinence. She stopped her HCTZ due to frequent urination and was also recently started on Toviaz, which she says is not helping. Will follow-up on this problem with patient at visit on 11/23/2018.   Kathyrn Sheriff, Student-PharmD 11/16/2018 11:25 AM.

## 2018-11-16 NOTE — BH Specialist Note (Signed)
Integrated Behavioral Health Follow Up Visit  MRN: [id number] Name: Jennifer [name]  Number of [place] Clinician visits: 2/6 Session Start time: 10:15  Session End time: 11:00 Total time: 45 minutes  Type of Service: Integrated Behavioral Health- Individual/Family Interpretor:No.    SUBJECTIVE: Jennifer [name] is a 57 y.o. female whom attended the session individually.  Patient was referred by Dr. [name] for depression and lack of housing. Patient reports the following symptoms/concerns: increased anxiety about housing and the future, lack of resources, depression symptoms, fleeting SI (no plan or intent), relationship issues, no permanent housing, and chronic health issues.  Duration of problem: Increased since losing her housing in 2018 ; Severity of problem: moderate  OBJECTIVE: Mood: Anxious, Depressed and Hopeless and Affect: Depressed Risk of harm to self or others: No plan to harm self or others. Patient has no plan or intent, however she has periods of fleeting SI.   LIFE CONTEXT: Family and Social: Patient is continuing to live in a hotel on a night-to-night basis with her two adult sons. Patient reported her youngest son will call her names, but the patient reported she is not willing to live without him.  School/Work: On disability.  Self-Care: Patient needs assistance from her sons to complete daily activities.  Life Changes: Lost housing.   GOALS ADDRESSED: Patient will: 1.  Reduce symptoms of: anxiety, depression and stress  2.  Increase knowledge and/or ability of: coping skills, healthy habits and stress reduction  3.  Demonstrate ability to: Increase healthy adjustment to current life circumstances, Increase adequate support systems for patient/family and gain stable housing.  INTERVENTIONS: Interventions utilized:  Solution-Focused Strategies, Supportive Counseling and Link to [employer] Assessments completed: assessed  for SI, HI, and self-harm.   ASSESSMENT: Patient currently experiencing sleep issues (reported not taking her sleep medication), depression symptoms, fleeting SI (no plan or intent), anger towards her situation and family members. Patient reported feeling hopeless and is very overwhelmed with her loss of housing. Patient plans to begin contacting housing options provided to her in the session.   Patient may benefit from weekly outpatient therapy.  PLAN: 1. Follow up with behavioral health clinician on : one week.    Dessie Coma, LPC, LCAS

## 2018-11-17 ENCOUNTER — Encounter: Payer: Self-pay | Admitting: Licensed Clinical Social Worker

## 2018-11-22 NOTE — Addendum Note (Signed)
Addended by: Larey Dresser A on: 11/22/2018 10:48 AM   Modules accepted: Orders

## 2018-11-23 ENCOUNTER — Telehealth: Payer: Self-pay | Admitting: Licensed Clinical Social Worker

## 2018-11-23 DIAGNOSIS — I38 Endocarditis, valve unspecified: Secondary | ICD-10-CM | POA: Diagnosis not present

## 2018-11-23 DIAGNOSIS — G4733 Obstructive sleep apnea (adult) (pediatric): Secondary | ICD-10-CM | POA: Diagnosis not present

## 2018-11-23 NOTE — Telephone Encounter (Signed)
Patient called the office asking if we could provide a bus pass for her to make her appointment tomorrow. Patient was told we could give her a bus pass to assist with making it to her appointment tomorrow in our clinic.

## 2018-11-24 ENCOUNTER — Encounter: Payer: Self-pay | Admitting: Licensed Clinical Social Worker

## 2018-11-24 ENCOUNTER — Ambulatory Visit: Payer: Medicare Other | Admitting: Licensed Clinical Social Worker

## 2018-11-24 ENCOUNTER — Ambulatory Visit (INDEPENDENT_AMBULATORY_CARE_PROVIDER_SITE_OTHER): Payer: Medicare Other | Admitting: Pharmacist

## 2018-11-24 DIAGNOSIS — E1121 Type 2 diabetes mellitus with diabetic nephropathy: Secondary | ICD-10-CM | POA: Diagnosis not present

## 2018-11-24 DIAGNOSIS — Z794 Long term (current) use of insulin: Secondary | ICD-10-CM

## 2018-11-24 DIAGNOSIS — F321 Major depressive disorder, single episode, moderate: Secondary | ICD-10-CM

## 2018-11-24 NOTE — Patient Instructions (Signed)
Patient educated about medication as defined in this encounter and verbalized understanding by repeating back instructions provided.   

## 2018-11-24 NOTE — Clinical Note (Incomplete)
Integrated Behavioral Health Follow Up Visit  MRN: [id number] Name: Jennifer [name]  Number of [place] Clinician visits: {IBH Number of Visits:21014052} Session Start time: ***  Session End time: *** Total time: {IBH Total Time:21014050}  Type of Service: [place] Interpretor:{yes [id number]} Interpretor Name and Language: ***  SUBJECTIVE: Jennifer [name] is a 57 y.o. female accompanied by {Patient accompanied by:[phone]} Patient was referred by *** for ***. Patient reports the following symptoms/concerns: *** Duration of problem: ***; Severity of problem: {Mild/Moderate/Severe:20260}  OBJECTIVE: Mood: {BHH MOOD:22306} and Affect: {BHH AFFECT:22307} Risk of harm to self or others: {CHL AMB BH Suicide Current Mental Status:21022748}  LIFE CONTEXT: Family and Social: *** School/Work: *** Self-Care: *** Life Changes: ***  GOALS ADDRESSED: Patient will: 1.  Reduce symptoms of: {IBH Symptoms:21014056}  2.  Increase knowledge and/or ability of: {IBH Patient Tools:21014057}  3.  Demonstrate ability to: {IBH Goals:21014053}  INTERVENTIONS: Interventions utilized:  {IBH Interventions:21014054} Standardized Assessments completed: {IBH Screening Tools:21014051}  ASSESSMENT: Patient currently experiencing ***.   Patient may benefit from ***.  PLAN: 1. Follow up with behavioral health clinician on : *** 2. Behavioral recommendations: *** 3. Referral(s): {IBH Referrals:21014055} 4. "From scale of 1-10, how likely are you to follow plan?": ***  Dessie Coma, [facility]
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
## 2018-11-24 NOTE — Progress Notes (Signed)
S: Jennifer [name] is a 57 y.o. female reports to clinical pharmacist appointment for diabetes medication management per PCP consult.  Allergies  Allergen Reactions  . Abilify [Aripiprazole] Other (See Comments)    Urinary freq Nov 2016  . Iohexol      Desc: IV CONTRAST CAUSE NEPHROPATHY IN 2007   . Ivp Dye [Iodinated Diagnostic Agents]   . Morphine Sulfate Itching and Rash   Medication Sig  albuterol (PROVENTIL) (2.5 MG/3ML) 0.083% nebulizer solution INHALE THE CONTENTS OF 1 VIAL VIA NEBULIZER EVERY 6 HOURS AS NEEDED FOR WHEEZING  amLODipine-benazepril (LOTREL) 10-40 MG capsule Take 1 capsule by mouth daily.  atenolol (TENORMIN) 100 MG tablet TAKE 1 TABLET BY MOUTH EVERY DAY  baclofen (LIORESAL) 10 MG tablet TAKE 1 TABLET BY MOUTH THREE TIMES A DAY AS NEEDED FOR MUSCLE SPASMS  betamethasone dipropionate (DIPROLENE) 0.05 % cream Apply topically 2 (two) times daily. Do not use on face, skin folds, or areas with yeast infection. Wash hands after use. Use for only 2 wks  buPROPion (WELLBUTRIN XL) 300 MG 24 hr tablet TAKE 1 TABLET BY MOUTH ONCE DAILY Patient taking differently: TAKE 300 mg TABLET BY MOUTH ONCE DAILY  cholecalciferol (VITAMIN D) 1000 units tablet Take 1,000 Units by mouth daily.  DULoxetine (CYMBALTA) 30 MG capsule TAKE 1 CAPSULE BY MOUTH EVERY DAY  EASY TOUCH PEN NEEDLES 31G X 8 MM MISC USE TO INJECT INSULIN TWICE DAILY  fesoterodine (TOVIAZ) 8 MG TB24 tablet Take 1 tablet (8 mg total) by mouth daily.  fluticasone (FLONASE) 50 MCG/ACT nasal spray INSTILL 1 SPRAY IN EACH NOSTRIL DAILY  furosemide (LASIX) 40 MG tablet TAKE ONE TABLET BY MOUTH DAILY AS NEEDED  HUMALOG MIX 75/25 KWIKPEN (75-25) 100 UNIT/ML Kwikpen INJECT 130 UNITS SUBCUTANEOUSLY EVERY MORNING BEFORE BREAKFAST AND 85 UNITS BEFORE SUPPER  hydrochlorothiazide (HYDRODIURIL) 25 MG tablet TAKE 1 TABLET BY MOUTH ONCE DAILY  hydrOXYzine (ATARAX/VISTARIL) 25 MG tablet TAKE 1 TABLET (25 MG TOTAL) BY MOUTH 3 (THREE) TIMES  DAILY AS NEEDED FOR ITCHING.  Insulin Degludec (TRESIBA FLEXTOUCH) 200 UNIT/ML SOPN Inject 60 Units into the skin daily.  ipratropium (ATROVENT) 0.02 % nebulizer solution USE 1 VIAL VIA NEBULIZER EVERY 6 HOURS AS NEEDED FOR WHEEZING  ketoconazole (NIZORAL) 2 % cream Apply 1 application topically daily. Apply to L stomach / thigh skin fold for 2 weeks  metFORMIN (GLUCOPHAGE-XR) 500 MG 24 hr tablet TAKE 1 TABLET BY MOUTH EVERY DAY WITH BREAKFAST  mirabegron ER (MYRBETRIQ) 50 MG TB24 tablet Take 1 tablet (50 mg total) by mouth daily.  NYSTATIN powder APPLY TO AFFECTED AREA THREE TIMES A DAY  omeprazole (PRILOSEC) 20 MG capsule Take 1 capsule (20 mg total) by mouth 2 (two) times daily before a meal.  ONETOUCH DELICA LANCETS 97Q MISC USE TO CHECK BLOOD SUGARS FOUR TIMES A DAY  ONETOUCH VERIO test strip USE 3-4 TIMES DAILY TO CHECK BLOOD SUGAR  potassium chloride SA (K-DUR,KLOR-CON) 20 MEQ tablet TAKE 2 TABLETS BY MOUTH DAILY  pregabalin (LYRICA) 200 MG capsule TAKE 1 CAPSULE BY MOUTH THREE TIMES A DAY  rosuvastatin (CRESTOR) 20 MG tablet TAKE 1 TABLET BY MOUTH AT BEDTIME  traZODone (DESYREL) 100 MG tablet TAKE ONE TABLET BY MOUTH DAILY AT BEDTIME AS NEEDED FOR SLEEP  umeclidinium-vilanterol (ANORO ELLIPTA) 62.5-25 MCG/INH AEPB Inhale 1 puff into the lungs daily.  vitamin B-12 (CYANOCOBALAMIN) 100 MCG tablet Take 100 mcg by mouth daily.  vitamin C (ASCORBIC ACID) 500 MG tablet Take 500 mg by mouth daily.  Potassium Chloride ER 20 MEQ TBCR Take 40 mEq by mouth daily.   Past Medical History:  Diagnosis Date  . Anginal pain [facility])    '3' of 10 ischemia ruled out 9/9   . Arthritis of lumbar spine   . Asthma   . CHF (congestive heart failure) ([place])   . 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 Chronic bronchitis ([place])    "I get it alot" (09/28/2013)  . Chronic diastolic heart failure (HCC)    grade 2 per 2D echocardiogram (01/2013)  . Chronic lower back pain   . Chronic osteomyelitis of  foot (HCC)    chronic, right secondary to diabetic foot ulcers  . Chronic pain syndrome 12/03/2011   Likely secondary to depression, "fibromyalgia", neuropathy, and obesity. Lumbar MRI 2014 no sig change from prior (2008) : Stable hypertrophic facet disease most notable at L4-5. Stable shallow left foraminal/extraforaminal disc protrusion at L4-5. No direct neural compression.      Marland Kitchen COPD 01/08/2007   PFT's 05/2007 : FEV1/FVC 82, FEV1 64% pred, FEF 25-75% 40% predicted, 16% improvement in FEV1 with bronchodilators.     . Depression   . Diabetic peripheral neuropathy ([place])   . DVT of upper extremity (deep vein thrombosis) ([place]) 03/11/2013   Secondary to PICC line. Right brachial vein, diagnosed on 03/10/2013 Coumadin for 3 months. End date 06/10/2013   . Environmental allergies    Hx: of  . Exertional shortness of breath   . Fatty liver 2003   observed on ultrasound abdomen  . Fibromyalgia   . GERD (gastroesophageal reflux disease)   . Glaucoma   . Hyperlipidemia   . Hyperplastic colon polyp 12/2010   Per colonoscopy (12/2010) - Dr. Deatra Ina  . Hypertension   . 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 Blood cultures 01/26/13 S. Aureus and GBS. Blood cultures Feb 6th, 8th, and 9th and March were negative.Repeat TEE 3/20 negative for vegitations  . 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 2/2 tobacco use, DM, and cont weight bearing on surgical wound and developed gangrene   . Polymicrobial bacterial infection 01/2013   GBS and S. aureus bacteremia // Source likely infected diabetic foot ulcer  . PVD (peripheral vascular disease) with claudication ([place])    Stents to bilateral common iliac arteries (left 2005, right 2008), on chronic plavix  . Rheumatoid arthritis ([place])   . Tobacco abuse   . Type II diabetes mellitus with peripheral circulatory disorders, uncontrolled DX:  1993   Insulin dep. Poor control. Complicated by diabetic foot ulcer and diabetic eye disease.    Marland Kitchen Ulcer of foot, chronic (HCC)    Left. No OM per MRI (01/2013)   Social History   Socioeconomic History  . Marital status: Divorced    Spouse name: Not on file  . Number of children: 2  . Years of education: college  . Highest education level: Not on file  Occupational History  . Occupation: Disability    Comment: previously worked as a Civil engineer, contracting  . Financial resource strain: Very hard  . Food insecurity:    Worry: Often true    Inability: Often true  . Transportation needs:    Medical: No    Non-medical: Yes  Tobacco Use  . Smoking status: Current Every Day Smoker    Packs/day: 1.00    Years: 44.00  Pack years: 44.00    Types: Cigarettes  . Smokeless tobacco: Never Used  . Tobacco comment: 1PPD  Substance and Sexual Activity  . Alcohol use: No    Alcohol/week: 0.0 standard drinks  . Drug use: No    Types: Marijuana, "Crack" cocaine    Comment: 09/28/2013 "no marijuana since 03/27/2010, no crack/cocaine 1989"  . Sexual activity: Not Currently  Lifestyle  . Physical activity:    Days per week: Not on file    Minutes per session: Not on file  . Stress: Not on file  Relationships  . Social connections:    Talks on phone: Not on file    Gets together: Not on file    Attends religious service: Not on file    Active member of club or organization: Not on file    Attends meetings of clubs or organizations: Not on file    Relationship status: Not on file  Other Topics Concern  . Not on file  Social History Narrative   On disability. Lives with son in [place]. Formerly worked as Training and development officer.    Boyfriend passed away stage 4 cancer 2013-03-27.   S/p L BKA 27-Mar-2013. In wheelchair in paritially suitable apartment.    Family History  Problem Relation Age of Onset  . Diverticulosis Mother   . Diabetes Mother   . Hypertension Mother   . Congestive Heart Failure Mother   .  Asthma Father   . CAD Sister 54       MI at age 36 per patient.  However, she has not had a stent or CABG.   . Heart disease Sister        before age 87  . Breast cancer Neg Hx    O:    Component Value Date/Time   CHOL 139 06/05/2016 1118   HDL 34 (L) 06/05/2016 1118   LDLCALC 77 06/05/2016 1118   TRIG 142 06/05/2016 1118   GLUCOSE 434 (H) 01/21/2018 1635   HGBA1C 8.2 (A) 10/21/2018 1109   HGBA1C 8.5 (H) 11/07/2016 1125   NA 136 01/21/2018 1635   NA 144 11/07/2016 1125   K 4.2 01/21/2018 1635   CL 98 (L) 01/21/2018 1635   CO2 27 01/21/2018 1635   BUN 9 01/21/2018 1635   BUN 15 11/07/2016 1125   CREATININE 1.03 (H) 01/21/2018 1635   CREATININE 0.68 01/31/2015 1641   CALCIUM 8.5 (L) 01/21/2018 1635   GFRNONAA 59 (L) 01/21/2018 1635   GFRNONAA >89 01/31/2015 1641   GFRAA >60 01/21/2018 1635   GFRAA >89 01/31/2015 1641   AST 35 01/14/2017 2131   ALT 34 01/14/2017 2131   WBC 9.1 11/11/2018 1225   WBC 5.5 01/22/2018 0944   HGB 13.5 11/11/2018 1225   HCT 40.7 11/11/2018 1225   PLT 155 11/11/2018 1225   TSH 1.516 01/07/2017 0151   TSH 1.120 11/07/2016 1125   Ht Readings from Last 2 Encounters:  05/27/18 5\' 7"  (1.702 m)  01/21/18 5\' 7"  (1.702 m)   Wt Readings from Last 2 Encounters:  11/11/18 260 lb 4.8 oz (118.1 kg)  05/27/18 275 lb 11.2 oz (125.1 kg)   There is no height or weight on file to calculate BMI. BP Readings from Last 3 Encounters:  11/11/18 137/68  10/21/18 132/63  05/27/18 (!) 158/73   A/P: Patient reports for follow up diabetes medication management and CGM. This is the 2nd attempt at CGM due to sensor falling off last week. Unfortunately, sensor fell off again  so will defer CGM for now. Her 4-day BG average was 322 mg/dL, supratherapeutic 90% of the time, within target range 10% of the time, no hypoglycemia detected.  Patient correctly states her current regimen including metformin 500 mg and insulin degludec 60 units daily. She refuses to increase  metformin dose due to side effects. Discussed DM therapy options with patients and we agreed on semaglutide due to once-weekly administration considering regimen complexity in addition to the CV/renal/weight benefits. Provided education on semaglutide including safe use and potential adverse effects. Concerned for patients GI history, but she is willing to try to see if she can tolerate semaglutide, if not, we can try liraglutide or dulaglutide before avoiding the class completely due to potential benefits. Of note, patient is following up with integrated behavioral specialist as well which can help to support her overall management.  Patient has difficulty with transportation so we agreed to follow up via telephone call and/or co-visit every 1-2 weeks. Will notify PCP of plan. An after visit summary was provided and patient advised to follow sooner if any changes in condition or questions regarding medications arise.   The patient verbalized understanding of information provided by repeating back concepts discussed.   15 minutes spent face-to-face with the patient during the encounter. 50% of time spent on education. 50% of time was spent on assessment, plan, coordination of care.

## 2018-11-25 ENCOUNTER — Encounter: Payer: Self-pay | Admitting: Licensed Clinical Social Worker

## 2018-11-25 ENCOUNTER — Other Ambulatory Visit: Payer: Self-pay | Admitting: Internal Medicine

## 2018-11-26 NOTE — Telephone Encounter (Signed)
Pro air stopped last D/C in Feb. I want her to have it bc can't take alb neb with her when she leaves house.

## 2018-11-26 NOTE — Telephone Encounter (Signed)
rec'd 2 requests for refill, one the PROAIR was stopped 12mon ago, triage cannot not remove the request so is sent to dr Software engineer for stop.

## 2018-12-09 ENCOUNTER — Telehealth: Payer: Self-pay | Admitting: Pharmacist

## 2018-12-23 ENCOUNTER — Other Ambulatory Visit: Payer: Self-pay | Admitting: *Deleted

## 2018-12-23 NOTE — Telephone Encounter (Signed)
Pt states her cbg's are not dropping, do you want her to come in [facility]

## 2018-12-24 DIAGNOSIS — G4733 Obstructive sleep apnea (adult) (pediatric): Secondary | ICD-10-CM | POA: Diagnosis not present

## 2018-12-24 DIAGNOSIS — I38 Endocarditis, valve unspecified: Secondary | ICD-10-CM | POA: Diagnosis not present

## 2018-12-24 MED ORDER — INSULIN DEGLUDEC 200 UNIT/ML ~~LOC~~ SOPN: 60.0000 [IU] | PEN_INJECTOR | Freq: Every day | SUBCUTANEOUS | 2 refills | Status: DC

## 2018-12-24 MED ORDER — SEMAGLUTIDE(0.25 OR 0.5MG/DOS) 2 MG/1.5ML ~~LOC~~ SOPN: 0.2500 mg | PEN_INJECTOR | SUBCUTANEOUS | 2 refills | Status: DC

## 2018-12-24 NOTE — Telephone Encounter (Signed)
Needs early Feb appt with me. DM F/U

## 2018-12-27 ENCOUNTER — Telehealth: Payer: Self-pay | Admitting: Internal Medicine

## 2018-12-27 NOTE — Telephone Encounter (Signed)
Need an update prescription for a new prostheses leg, pls contact  [phone]

## 2018-12-28 NOTE — Telephone Encounter (Signed)
Called patient to determine what is needed. She stated Bobby Crutchfield at [employer] says she needs a new Rx for leg prosthesis. Call placed to [facility]. States patient will need Face to Face Visit to document need for new leg prosthesis. Apparently patient has original socket and has had significant volume changes which makes socket too large. Will need that documented at [facility]. Also will need to write "New leg prosthesis" on Rx pad and fax to [facility] at [phone]. Patient has appt with PCP on 01/27/2019 but would like to see her sooner to have this completed. Appt made for 12/30/2018 at 0945. [name], RN, BSN

## 2018-12-30 ENCOUNTER — Ambulatory Visit (INDEPENDENT_AMBULATORY_CARE_PROVIDER_SITE_OTHER): Payer: Medicare Other | Admitting: Internal Medicine

## 2018-12-30 ENCOUNTER — Ambulatory Visit: Payer: Medicare Other | Admitting: Licensed Clinical Social Worker

## 2018-12-30 DIAGNOSIS — Z89519 Acquired absence of unspecified leg below knee: Secondary | ICD-10-CM | POA: Diagnosis not present

## 2018-12-30 DIAGNOSIS — F321 Major depressive disorder, single episode, moderate: Secondary | ICD-10-CM

## 2018-12-30 NOTE — Patient Instructions (Signed)
1. I filled out paperwork for [employer] and to get your prosthesis 2. See me in about 1 month

## 2018-12-30 NOTE — Progress Notes (Signed)
   Subjective:    [name], female    DOB: 1961/12/16, 58 y.o.   MRN: [id number]  HPI  Jennifer [name] is here to complete paper work to get a new prosthesis.. Please see the A&P for the status of the pt's chronic medical problems.  ROS : per ROS section and in problem oriented charting. All other systems are negative.  PMHx, Soc hx, and / or Fam hx : She and her sons are still living in a motel.  She has a small refrigerator and a microwave so they are mostly eating convenience foods.   Review of Systems  Skin: Positive for wound.  Psychiatric/Behavioral: Positive for dysphoric mood.       Objective:   Physical Exam Constitutional:      Appearance: Normal appearance. She is obese. She is not ill-appearing or diaphoretic.  HENT:     Head: Normocephalic and atraumatic.     Right Ear: External ear normal.     Left Ear: External ear normal.     Nose: Nose normal. No rhinorrhea.  Pulmonary:     Effort: Pulmonary effort is normal.  Skin:    General: Skin is warm and dry.     Comments: There is no open wound on the left stump but there is hypertrophic scar like tissue where it rubbed  Neurological:     Mental Status: She is alert. Mental status is at baseline.     Comments: Able to stand independently and balance  Psychiatric:        Mood and Affect: Mood normal.        Behavior: Behavior normal.        Thought Content: Thought content normal.        Judgment: Judgment normal.           Assessment & Plan:
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
## 2018-12-30 NOTE — Assessment & Plan Note (Signed)
This problem is chronic and uncontrolled.  She needed a new sleeve and we arranged for her to go to get that under a payment plan.  She states it is been ordered and will be in in 1 to 2 weeks.  While there, it appears that they noticed her prosthesis was ill fitting because her stump has shrunk which is not uncommon.  She needed a prescription and a face-to-face for a new prosthesis.  They did not discuss whether her insurance will cover a knee prosthesis and what her co-pay would be.  I have provided her a prescription.  She also brought forms to be filled out for [employer].  I completed those and we made a copy for the chart.  PLAN : Completed [employer] paperwork Provided prescription for new prosthesis

## 2019-01-04 ENCOUNTER — Telehealth: Payer: Self-pay | Admitting: Pharmacist

## 2019-01-04 ENCOUNTER — Encounter: Payer: Self-pay | Admitting: Licensed Clinical Social Worker

## 2019-01-06 NOTE — Telephone Encounter (Signed)
This was completed at [facility] on 12/30/2018. [name], RN, BSN

## 2019-01-11 DIAGNOSIS — R350 Frequency of micturition: Secondary | ICD-10-CM | POA: Diagnosis not present

## 2019-01-11 NOTE — Progress Notes (Signed)
Unable to reach patient for follow up 

## 2019-01-12 ENCOUNTER — Telehealth: Payer: Self-pay

## 2019-01-12 DIAGNOSIS — Z89512 Acquired absence of left leg below knee: Secondary | ICD-10-CM | POA: Diagnosis not present

## 2019-01-12 MED ORDER — OXYCODONE-ACETAMINOPHEN 5-325 MG PO TABS: 1.0000 | ORAL_TABLET | Freq: Three times a day (TID) | ORAL | 0 refills | Status: DC | PRN

## 2019-01-12 NOTE — Telephone Encounter (Signed)
Would like to speak with a nurse about meds, please call pt back.

## 2019-01-12 NOTE — Telephone Encounter (Signed)
rtc to pt, she states she used the last of her semaglutide today needs more, called [facility], they will call her for permission to ship and send a shipment Pt states she also needs REFILL OF PAIN MED, it has fallen off medlist

## 2019-01-12 NOTE — Telephone Encounter (Signed)
Requesting a new Rx for prosthesic leg.

## 2019-01-12 NOTE — Telephone Encounter (Signed)
[facility]

## 2019-01-12 NOTE — Telephone Encounter (Signed)
Requesting a refill on oxycodone, and would like to speak with a nurse. Please call pt back.

## 2019-01-13 MED ORDER — OXYCODONE-ACETAMINOPHEN 5-325 MG PO TABS: 1.0000 | ORAL_TABLET | Freq: Three times a day (TID) | ORAL | 0 refills | Status: DC | PRN

## 2019-01-18 ENCOUNTER — Encounter (HOSPITAL_COMMUNITY): Payer: Self-pay | Admitting: Emergency Medicine

## 2019-01-18 ENCOUNTER — Emergency Department (HOSPITAL_COMMUNITY): Payer: Medicare Other

## 2019-01-18 ENCOUNTER — Emergency Department (HOSPITAL_COMMUNITY)
Admission: EM | Admit: 2019-01-18 | Discharge: 2019-01-18 | Disposition: A | Discharge status: Home / Self Care | Payer: Medicare Other | Attending: Emergency Medicine | Admitting: Emergency Medicine

## 2019-01-18 ENCOUNTER — Telehealth: Payer: Self-pay | Admitting: *Deleted

## 2019-01-18 DIAGNOSIS — E119 Type 2 diabetes mellitus without complications: Secondary | ICD-10-CM | POA: Diagnosis not present

## 2019-01-18 DIAGNOSIS — J45909 Unspecified asthma, uncomplicated: Secondary | ICD-10-CM | POA: Diagnosis not present

## 2019-01-18 DIAGNOSIS — Z79899 Other long term (current) drug therapy: Secondary | ICD-10-CM | POA: Insufficient documentation

## 2019-01-18 DIAGNOSIS — I5032 Chronic diastolic (congestive) heart failure: Secondary | ICD-10-CM | POA: Insufficient documentation

## 2019-01-18 DIAGNOSIS — Z794 Long term (current) use of insulin: Secondary | ICD-10-CM | POA: Diagnosis not present

## 2019-01-18 DIAGNOSIS — M940 Chondrocostal junction syndrome [Tietze]: Secondary | ICD-10-CM | POA: Diagnosis not present

## 2019-01-18 DIAGNOSIS — Z89422 Acquired absence of other left toe(s): Secondary | ICD-10-CM | POA: Insufficient documentation

## 2019-01-18 DIAGNOSIS — Z89432 Acquired absence of left foot: Secondary | ICD-10-CM | POA: Insufficient documentation

## 2019-01-18 DIAGNOSIS — R9431 Abnormal electrocardiogram [ECG] [EKG]: Secondary | ICD-10-CM | POA: Diagnosis not present

## 2019-01-18 DIAGNOSIS — F17210 Nicotine dependence, cigarettes, uncomplicated: Secondary | ICD-10-CM | POA: Diagnosis not present

## 2019-01-18 DIAGNOSIS — Z89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): Secondary | ICD-10-CM | POA: Diagnosis not present

## 2019-01-18 DIAGNOSIS — R0602 Shortness of breath: Secondary | ICD-10-CM | POA: Diagnosis not present

## 2019-01-18 DIAGNOSIS — R0789 Other chest pain: Secondary | ICD-10-CM | POA: Diagnosis present

## 2019-01-18 DIAGNOSIS — I110 Hypertensive heart disease with heart failure: Secondary | ICD-10-CM | POA: Insufficient documentation

## 2019-01-18 DIAGNOSIS — R079 Chest pain, unspecified: Secondary | ICD-10-CM | POA: Diagnosis not present

## 2019-01-18 DIAGNOSIS — Z89512 Acquired absence of left leg below knee: Secondary | ICD-10-CM | POA: Insufficient documentation

## 2019-01-18 LAB — CBC
HCT: 41.4 % (ref 36.0–46.0)
Hemoglobin: 13.4 g/dL (ref 12.0–15.0)
MCH: 30.5 pg (ref 26.0–34.0)
MCHC: 32.4 g/dL (ref 30.0–36.0)
MCV: 94.3 fL (ref 80.0–100.0)
Platelets: 163 10*3/uL (ref 150–400)
RBC: 4.39 MIL/uL (ref 3.87–5.11)
RDW: 13.6 % (ref 11.5–15.5)
WBC: 9.4 10*3/uL (ref 4.0–10.5)
nRBC: 0 % (ref 0.0–0.2)

## 2019-01-18 LAB — BASIC METABOLIC PANEL
Anion gap: 9 (ref 5–15)
BUN: 13 mg/dL (ref 6–20)
CO2: 23 mmol/L (ref 22–32)
Calcium: 8.9 mg/dL (ref 8.9–10.3)
Chloride: 106 mmol/L (ref 98–111)
Creatinine, Ser: 0.98 mg/dL (ref 0.44–1.00)
GFR calc Af Amer: >60 mL/min (ref >60)
GFR calc non Af Amer: >60 mL/min (ref >60)
Glucose, Bld: 221 mg/dL — ABNORMAL HIGH (ref 70–99)
Potassium: 4.3 mmol/L (ref 3.5–5.1)
Sodium: 138 mmol/L (ref 135–145)

## 2019-01-18 LAB — I-STAT TROPONIN, ED: Troponin i, poc: 0 ng/mL (ref 0.00–0.08)

## 2019-01-18 MED ORDER — PREDNISONE 20 MG PO TABS: ORAL_TABLET | ORAL | 0 refills | Status: DC

## 2019-01-18 MED ORDER — IPRATROPIUM-ALBUTEROL 0.5-2.5 (3) MG/3ML IN SOLN
3.0000 mL | Freq: Once | RESPIRATORY_TRACT | Status: AC
  Administered 2019-01-18: 3 mL via RESPIRATORY_TRACT
  Filled 2019-01-18: qty 3

## 2019-01-18 MED ORDER — KETOROLAC TROMETHAMINE 60 MG/2ML IM SOLN
30.0000 mg | Freq: Once | INTRAMUSCULAR | Status: AC
  Administered 2019-01-18: 30 mg via INTRAMUSCULAR
  Filled 2019-01-18: qty 2

## 2019-01-18 MED ORDER — IBUPROFEN 800 MG PO TABS: 800.0000 mg | ORAL_TABLET | Freq: Three times a day (TID) | ORAL | 0 refills | Status: DC

## 2019-01-18 MED ORDER — SODIUM CHLORIDE 0.9% FLUSH: 3.0000 mL | Freq: Once | INTRAVENOUS | Status: DC

## 2019-01-18 MED ORDER — ALBUTEROL SULFATE (2.5 MG/3ML) 0.083% IN NEBU: 2.5000 mg | INHALATION_SOLUTION | Freq: Four times a day (QID) | RESPIRATORY_TRACT | Status: DC | PRN

## 2019-01-18 MED ORDER — PREDNISONE 20 MG PO TABS
60.0000 mg | ORAL_TABLET | Freq: Once | ORAL | Status: AC
  Administered 2019-01-18: 60 mg via ORAL
  Filled 2019-01-18: qty 3

## 2019-01-18 NOTE — Telephone Encounter (Signed)
Thank you :)

## 2019-01-18 NOTE — Telephone Encounter (Signed)
Patient called in stating she's been having non-radiating, right sided chest pain with associated SHOB since 4 AM today. Pain comes every few minutes and is very intense. Patient advised to go directly to ED. Patient states she has no one to drive her. Advised she call 911. Patient is concerned over cost of ambulance and wants to make appt in [facility] tomorrow afternoon. This was done but again explained importance of being assessed in ED as soon as possible. Patient will call her dad and ask him to take her to ED. [name], RN, BSN

## 2019-01-18 NOTE — ED Triage Notes (Signed)
Patient told to come to ED by PCP because of sudden onset 10/10 non-radiating R breast/chest pain at 0400 this morning with shortness of breath, dizziness, N/V. Patient denies having this pain before, but she has significant history, including CHF. Patient appears to be in distress, tearful and slightly labored breathing in triage. Skin w/d.

## 2019-01-18 NOTE — Telephone Encounter (Signed)
Patient called back. States she is currently being evaluated in [facility] ED. She would like to cancel her appt for tomorrow. [name], RN, BSN

## 2019-01-18 NOTE — Telephone Encounter (Signed)
noted 

## 2019-01-19 ENCOUNTER — Other Ambulatory Visit: Payer: Self-pay | Admitting: Internal Medicine

## 2019-01-19 ENCOUNTER — Ambulatory Visit: Payer: Self-pay

## 2019-01-19 NOTE — ED Provider Notes (Signed)
[place] EMERGENCY DEPARTMENT Provider Note   CSN: [id number] Arrival date & time: 01/18/19  1253     History   Chief Complaint Chief Complaint  Patient presents with  . Chest Pain    HPI Jennifer [name] is a 58 y.o. female.  The history is provided by the patient and medical records.  Chest Pain  Pain location:  R chest Pain quality: sharp   Pain radiates to:  Does not radiate Pain severity:  Mild Onset quality:  Gradual Timing:  Constant Chronicity:  New Context: breathing   Relieved by:  Nothing Worsened by:  Nothing Ineffective treatments:  None tried   Past Medical History:  Diagnosis Date  . Anginal pain [facility])    '3' of 10 ischemia ruled out 9/9   . Arthritis of lumbar spine   . Asthma   . CHF (congestive heart failure) ([place])   . 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 Chronic bronchitis ([place])    "I get it alot" (09/28/2013)  . Chronic diastolic heart failure (HCC)    grade 2 per 2D echocardiogram (01/2013)  . Chronic lower back pain   . Chronic osteomyelitis of foot (HCC)    chronic, right secondary to diabetic foot ulcers  . Chronic pain syndrome 12/03/2011   Likely secondary to depression, "fibromyalgia", neuropathy, and obesity. Lumbar MRI 2014 no sig change from prior (2008) : Stable hypertrophic facet disease most notable at L4-5. Stable shallow left foraminal/extraforaminal disc protrusion at L4-5. No direct neural compression.      Marland Kitchen COPD 01/08/2007   PFT's 05/2007 : FEV1/FVC 82, FEV1 64% pred, FEF 25-75% 40% predicted, 16% improvement in FEV1 with bronchodilators.     . Depression   . Diabetic peripheral neuropathy ([place])   . DVT of upper extremity (deep vein thrombosis) ([place]) 03/11/2013   Secondary to PICC line. Right brachial vein, diagnosed on 03/10/2013 Coumadin for 3 months. End date 06/10/2013   . Environmental allergies    Hx: of  . Exertional shortness of breath   . Fatty liver 2003   observed on  ultrasound abdomen  . Fibromyalgia   . GERD (gastroesophageal reflux disease)   . Glaucoma   . Hyperlipidemia   . Hyperplastic colon polyp 12/2010   Per colonoscopy (12/2010) - Dr. Deatra Ina  . Hypertension   . 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 Blood cultures 01/26/13 S. Aureus and GBS. Blood cultures Feb 6th, 8th, and 9th and March were negative.Repeat TEE 3/20 negative for vegitations  . 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 2/2 tobacco use, DM, and cont weight bearing on surgical wound and developed gangrene   . Polymicrobial bacterial infection 01/2013   GBS and S. aureus bacteremia // Source likely infected diabetic foot ulcer  . PVD (peripheral vascular disease) with claudication ([place])    Stents to bilateral common iliac arteries (left 2005, right 2008), on chronic plavix  . Rheumatoid arthritis ([place])   . Tobacco abuse   . Type II diabetes mellitus with peripheral circulatory disorders, uncontrolled DX: 1993   Insulin dep. Poor control. Complicated by diabetic foot ulcer and diabetic eye disease.    Marland Kitchen Ulcer of foot, chronic (HCC)    Left. No OM per MRI (01/2013)    Patient Active Problem List   Diagnosis Date Noted  . Urinary incontinence 05/13/2018  .  Severe major depression without psychotic features ([place])   . OSA (obstructive sleep apnea) 06/12/2017  . Morbid obesity with BMI of 40.0-44.9, adult ([place]) 03/26/2017  . Toe amputation status, right 01/16/2017  . Thrombocytopenia ([place]) 06/05/2016  . 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 Type 2 diabetes with nephropathy ([place]) 09/07/2015  . Uncontrolled diabetes mellitus with retinopathy, due to underlying condition, without macular edema ([place]) 09/05/2015  . Diabetic retinopathy ([place]) 09/05/2015  . Counseling regarding end of life decision making 06/14/2015  . Atherosclerosis of aorta ([place]) 04/04/2015  . Anemia  10/05/2014  . Chronic diastolic heart failure ([place])   . S/P BKA (below knee amputation) unilateral ([place])   . Tobacco abuse   . Severe obesity (BMI >= 40) ([place]) 03/02/2013  . Abnormality of gait 03/01/2013  . Healthcare maintenance 07/10/2012  . Chronic prescription opiate use 12/03/2011  . Secondary diabetes mellitus with peripheral vascular disease ([place]) 08/27/2011  . 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 ([place]) 11/29/2009  . Hypertension associated with diabetes ([place]) 11/29/2009  . Chronic insomnia 10/25/2009  . GASTROESOPHAGEAL REFLUX DISEASE 11/24/2008  . Depression, major, severe recurrence ([place]) 04/06/2008  . DM (diabetes mellitus) type II uncontrolled, periph vascular disorder ([place]) 04/02/2007  . Hyperlipidemia associated with type 2 diabetes mellitus ([place]) 01/08/2007  . COPD (chronic obstructive pulmonary disease) ([place]) 01/08/2007    Past Surgical History:  Procedure Laterality Date  . ABDOMINAL HYSTERECTOMY  1997   secondary to uterine fibroids  . AMPUTATION Left 08/31/2013   Procedure: AMPUTATION RAY;  Surgeon: Newt Minion, MD;  Location: [place];  Service: Orthopedics;  Laterality: Left;  Left Foot 5th Ray Amputation  . AMPUTATION Left 09/28/2013   Procedure: Left Midfoot amputation;  Surgeon: Newt Minion, MD;  Location: [place];  Service: Orthopedics;  Laterality: Left;  Left Midfoot amputation  . AMPUTATION Left 10/14/2013   Procedure: AMPUTATION BELOW KNEE- left;  Surgeon: Newt Minion, MD;  Location: [place];  Service: Orthopedics;  Laterality: Left;  Left Below Knee Amputation   . AMPUTATION TOE Right 01/15/2017   Procedure: AMPUTATION 5th TOE RIGHT FOOT;  Surgeon: Edrick Kins, DPM;  Location: [place];  Service: Podiatry;  Laterality: Right;  . BLADDER SURGERY     bladder reconstruction surgery  . BREAST BIOPSY     multiple-benign per pt  . ESOPHAGOGASTRODUODENOSCOPY N/A 09/20/2013   Procedure: ESOPHAGOGASTRODUODENOSCOPY (EGD);  Surgeon: Jerene Bears, MD;  Location:  [place];  Service: Gastroenterology;  Laterality: N/A;  . FOOT AMPUTATION THROUGH METATARSAL Left 09/28/2013  . GANGLION CYST EXCISION     multiple  . PERIPHERAL VASCULAR INTERVENTION     stents in lower ext  . SHOULDER ARTHROSCOPY W/ ROTATOR CUFF REPAIR Bilateral   . SKIN SPLIT GRAFT Bilateral 05/13/2013   Procedure: Right and Left Foot Allograft Skin Graft;  Surgeon: Newt Minion, MD;  Location: [place];  Service: Orthopedics;  Laterality: Bilateral;  Right and Left Foot Allograft Skin Graft  . TEE WITHOUT CARDIOVERSION N/A 01/31/2013   Procedure: TRANSESOPHAGEAL ECHOCARDIOGRAM (TEE);  Surgeon: Fay Records, MD;  Location: [place];  Service: Cardiovascular;  Laterality: N/A;  Rm [phone]  . TEE WITHOUT CARDIOVERSION N/A 03/10/2013   Procedure: TRANSESOPHAGEAL ECHOCARDIOGRAM (TEE);  Surgeon: Larey Dresser, MD;  Location: [place];  Service: Cardiovascular;  Laterality: N/A;  Rm. [phone]  . TOE AMPUTATION Left 08/31/2013   4TH & 5 TH TOE   . TONSILLECTOMY    . TUBAL LIGATION    . WRIST SURGERY  Right    "for tumors" (09/28/2013)     OB History   No obstetric history on file.      Home Medications    Prior to Admission medications   Medication Sig Start Date End Date Taking? Authorizing Provider  amLODipine-benazepril (LOTREL) 10-40 MG capsule Take 1 capsule by mouth daily. 04/08/18  Yes Bartholomew Crews, MD  atenolol (TENORMIN) 100 MG tablet TAKE 1 TABLET BY MOUTH EVERY DAY Patient taking differently: Take 100 mg by mouth daily.  05/05/18  Yes Bartholomew Crews, MD  baclofen (LIORESAL) 10 MG tablet TAKE 1 TABLET BY MOUTH THREE TIMES A DAY AS NEEDED FOR MUSCLE SPASMS Patient taking differently: Take 10 mg by mouth 3 (three) times daily as needed for muscle spasms.  07/05/18  Yes Bartholomew Crews, MD  buPROPion (WELLBUTRIN XL) 300 MG 24 hr tablet TAKE 1 TABLET BY MOUTH DAILY Patient taking differently: Take 300 mg by mouth daily.  11/26/18  Yes Bartholomew Crews, MD   cholecalciferol (VITAMIN D) 1000 units tablet Take 1,000 Units by mouth daily.   Yes [provider]  DULoxetine (CYMBALTA) 30 MG capsule TAKE 1 CAPSULE BY MOUTH EVERY DAY Patient taking differently: Take 30 mg by mouth daily.  09/15/18  Yes Bartholomew Crews, MD  fesoterodine (TOVIAZ) 8 MG TB24 tablet Take 1 tablet (8 mg total) by mouth daily. 11/04/18  Yes Bartholomew Crews, MD  furosemide (LASIX) 40 MG tablet TAKE ONE TABLET BY MOUTH DAILY AS NEEDED Patient taking differently: Take 40 mg by mouth daily as needed for fluid.  07/05/18  Yes Bartholomew Crews, MD  hydrochlorothiazide (HYDRODIURIL) 25 MG tablet TAKE 1 TABLET BY MOUTH ONCE DAILY Patient taking differently: Take 25 mg by mouth daily.  10/01/18  Yes Bartholomew Crews, MD  hydrOXYzine (ATARAX/VISTARIL) 25 MG tablet TAKE 1 TABLET (25 MG TOTAL) BY MOUTH 3 (THREE) TIMES DAILY AS NEEDED FOR ITCHING. 07/05/18  Yes Bartholomew Crews, MD  Insulin Degludec (TRESIBA FLEXTOUCH) 200 UNIT/ML SOPN Inject 60 Units into the skin daily. 12/24/18  Yes Bartholomew Crews, MD  ipratropium (ATROVENT) 0.02 % nebulizer solution USE 1 VIAL VIA NEBULIZER EVERY 6 HOURS AS NEEDED FOR WHEEZING Patient taking differently: Take 0.5 mg by nebulization every 6 (six) hours as needed for wheezing.  08/04/18  Yes Bartholomew Crews, MD  metFORMIN (GLUCOPHAGE-XR) 500 MG 24 hr tablet TAKE 1 TABLET BY MOUTH EVERY DAY WITH BREAKFAST Patient taking differently: Take 500 mg by mouth daily with breakfast.  05/05/18  Yes Bartholomew Crews, MD  mirabegron ER (MYRBETRIQ) 50 MG TB24 tablet Take 1 tablet (50 mg total) by mouth daily. 10/29/18  Yes Bartholomew Crews, MD  omeprazole (PRILOSEC) 20 MG capsule Take 1 capsule (20 mg total) by mouth 2 (two) times daily before a meal. 05/13/18  Yes Bartholomew Crews, MD  oxyCODONE-acetaminophen (PERCOCET/ROXICET) 5-325 MG tablet Take 1 tablet by mouth every 8 (eight) hours as needed for up to 30 days. May take a  4th pill if severe pain (up to 10 days monthly) #100 equals 30 day supply 01/13/19 02/12/19 Yes Bartholomew Crews, MD  pregabalin (LYRICA) 200 MG capsule TAKE 1 CAPSULE BY MOUTH THREE TIMES A DAY Patient taking differently: Take 200 mg by mouth 3 (three) times daily.  10/29/18  Yes Bartholomew Crews, MD  rosuvastatin (CRESTOR) 20 MG tablet TAKE 1 TABLET BY MOUTH AT BEDTIME Patient taking differently: Take 20 mg by mouth at bedtime.  05/05/18  Yes Bartholomew Crews, MD  Semaglutide,0.25 or 0.5MG /DOS, 2 MG/1.5ML SOPN Inject 0.25 mg into the skin once a week. 12/24/18  Yes Bartholomew Crews, MD  traZODone (DESYREL) 100 MG tablet TAKE ONE TABLET BY MOUTH DAILY AT BEDTIME AS NEEDED FOR SLEEP Patient taking differently: Take 100 mg by mouth at bedtime as needed for sleep.  06/08/18  Yes Bartholomew Crews, MD  umeclidinium-vilanterol (ANORO ELLIPTA) 62.5-25 MCG/INH AEPB Inhale 1 puff into the lungs daily. 11/11/18  Yes Bartholomew Crews, MD  vitamin B-12 (CYANOCOBALAMIN) 100 MCG tablet Take 100 mcg by mouth daily.   Yes [provider]  vitamin C (ASCORBIC ACID) 500 MG tablet Take 500 mg by mouth daily.   Yes [provider]  albuterol (PROVENTIL) (2.5 MG/3ML) 0.083% nebulizer solution Take 3 mLs (2.5 mg total) by nebulization every 6 (six) hours as needed for wheezing. 01/18/19   Toriano Aikey, Corene Cornea, MD  EASY TOUCH PEN NEEDLES 31G X 8 MM [place] USE TO INJECT INSULIN TWICE DAILY 08/04/18   Bartholomew Crews, MD  fluticasone (FLONASE) 50 MCG/ACT nasal spray INSTILL 1 SPRAY IN EACH NOSTRIL DAILY Patient taking differently: Place 1 spray into both nostrils daily.  06/08/18   Bartholomew Crews, MD  ibuprofen (ADVIL,MOTRIN) 800 MG tablet Take 1 tablet (800 mg total) by mouth 3 (three) times daily. 01/18/19   Kentley Blyden, Corene Cornea, MD  [facility] DELICA LANCETS 16W MISC USE TO CHECK BLOOD SUGARS FOUR TIMES A DAY 05/05/18   Bartholomew Crews, MD  [facility] VERIO test strip USE 3-4 TIMES DAILY TO  CHECK BLOOD SUGAR 10/01/18   Bartholomew Crews, MD  predniSONE (DELTASONE) 20 MG tablet 2 tabs po daily x 4 days 01/18/19   Marceil Welp, Corene Cornea, MD  Potassium Chloride ER 20 MEQ TBCR Take 40 mEq by mouth daily. 01/02/14 01/23/14  Othella Boyer, MD    Family History Family History  Problem Relation Age of Onset  . Diverticulosis Mother   . Diabetes Mother   . Hypertension Mother   . Congestive Heart Failure Mother   . Asthma Father   . CAD Sister 36       MI at age 70 per patient.  However, she has not had a stent or CABG.   . Heart disease Sister        before age 30  . Breast cancer Neg Hx     Social History Social History   Tobacco Use  . Smoking status: Current Every Day Smoker    Packs/day: 0.50    Years: 44.00    Pack years: 22.00    Types: Cigarettes  . Smokeless tobacco: Never Used  . Tobacco comment: 1PPD  Substance Use Topics  . Alcohol use: No    Alcohol/week: 0.0 standard drinks  . Drug use: No    Types: Marijuana, "Crack" cocaine    Comment: 09/28/2013 "no marijuana since 2011, no crack/cocaine 1989"     Allergies   Abilify [aripiprazole]; Iohexol; Ivp dye [iodinated diagnostic agents]; and Morphine sulfate   Review of Systems Review of Systems  Cardiovascular: Positive for chest pain.  All other systems reviewed and are negative.    Physical Exam Updated Vital Signs BP (!) 134/91   Pulse (!) 53   Resp 13   SpO2 93%   Physical Exam Vitals signs and nursing note reviewed.  Constitutional:      Appearance: She is well-developed.  HENT:     Head: Normocephalic and atraumatic.     Nose:  No rhinorrhea.     Mouth/Throat:     Mouth: Mucous membranes are dry.     Pharynx: Oropharynx is clear.  Eyes:     Conjunctiva/sclera: Conjunctivae normal.  Neck:     Musculoskeletal: Normal range of motion.  Cardiovascular:     Rate and Rhythm: Normal rate and regular rhythm.  Pulmonary:     Effort: No respiratory distress.     Breath sounds: No stridor.   Abdominal:     General: Abdomen is flat. There is no distension.  Musculoskeletal:        General: Tenderness (left chest) present.  Skin:    General: Skin is warm and dry.  Neurological:     General: No focal deficit present.     Mental Status: She is alert.      ED Treatments / Results  Labs (all labs ordered are listed, but only abnormal results are displayed) Labs Reviewed  BASIC METABOLIC PANEL - Abnormal; Notable for the following components:      Result Value   Glucose, Bld 221 (*)    All other components within normal limits  CBC  I-STAT TROPONIN, ED    EKG EKG Interpretation  Date/Time:  Tuesday January 18 2019 12:58:55 EST Ventricular Rate:  77 PR Interval:  118 QRS Duration: 78 QT Interval:  400 QTC Calculation: 452 R Axis:   11 Text Interpretation:  Normal sinus rhythm Low voltage QRS Cannot rule out Anterior infarct , age undetermined Abnormal ECG No significant change since last tracing Confirmed by Orlie Dakin [phone]) on 01/18/2019 1:04:45 PM   Radiology Dg Chest 2 View  Result Date: 01/18/2019 CLINICAL DATA:  Chest pain and shortness of Breath EXAM: CHEST - 2 VIEW COMPARISON:  04/08/2018 FINDINGS: Cardiac shadow is within normal limits. Lungs are clear bilaterally. No focal infiltrate is noted. Mild degenerative changes of the thoracic spine are seen. IMPRESSION: No active cardiopulmonary disease. Electronically Signed   By: Inez Catalina M.D.   On: 01/18/2019 13:19    Procedures Procedures (including critical care time)  Medications Ordered in ED Medications  sodium chloride flush (NS) 0.9 % injection 3 mL (3 mLs Intravenous Not Given 01/18/19 2050)  ipratropium-albuterol (DUONEB) 0.5-2.5 (3) MG/3ML nebulizer solution 3 mL (3 mLs Nebulization Given 01/18/19 2220)  predniSONE (DELTASONE) tablet 60 mg (60 mg Oral Given 01/18/19 2227)  ketorolac (TORADOL) injection 30 mg (30 mg Intramuscular Given 01/18/19 2226)     Initial Impression / Assessment  and Plan / ED Course  I have reviewed the triage vital signs and the nursing notes.  Pertinent labs & imaging results that were available during my care of the patient were reviewed by me and considered in my medical decision making (see chart for details).  Suspect costochondritis. Pain is reproducible, improved with nsaids/prednisone. Very atypical for ACS/PE. CP free on repeat exam. Improved ttp.   Final Clinical Impressions(s) / ED Diagnoses   Final diagnoses:  Costochondritis    ED Discharge Orders         Ordered    predniSONE (DELTASONE) 20 MG tablet     01/18/19 2346    albuterol (PROVENTIL) (2.5 MG/3ML) 0.083% nebulizer solution  Every 6 hours PRN     01/18/19 2346    ibuprofen (ADVIL,MOTRIN) 800 MG tablet  3 times daily     01/18/19 2347           Khamiyah Grefe, Corene Cornea, MD 01/19/19 [phone]

## 2019-01-20 ENCOUNTER — Encounter: Payer: Self-pay | Admitting: Dietician

## 2019-01-20 ENCOUNTER — Encounter: Payer: Self-pay | Admitting: Internal Medicine

## 2019-01-20 ENCOUNTER — Ambulatory Visit (INDEPENDENT_AMBULATORY_CARE_PROVIDER_SITE_OTHER): Payer: Medicare Other | Admitting: Internal Medicine

## 2019-01-20 ENCOUNTER — Telehealth: Payer: Self-pay | Admitting: Dietician

## 2019-01-20 ENCOUNTER — Ambulatory Visit (INDEPENDENT_AMBULATORY_CARE_PROVIDER_SITE_OTHER): Payer: Medicare Other | Admitting: Dietician

## 2019-01-20 VITALS — BP 123/71 | HR 70 | Wt 259.6 lb

## 2019-01-20 DIAGNOSIS — Z79899 Other long term (current) drug therapy: Secondary | ICD-10-CM

## 2019-01-20 DIAGNOSIS — Z794 Long term (current) use of insulin: Secondary | ICD-10-CM

## 2019-01-20 DIAGNOSIS — F332 Major depressive disorder, recurrent severe without psychotic features: Secondary | ICD-10-CM

## 2019-01-20 DIAGNOSIS — IMO0002 Reserved for concepts with insufficient information to code with codable children: Secondary | ICD-10-CM

## 2019-01-20 DIAGNOSIS — E1151 Type 2 diabetes mellitus with diabetic peripheral angiopathy without gangrene: Secondary | ICD-10-CM

## 2019-01-20 DIAGNOSIS — Z89519 Acquired absence of unspecified leg below knee: Secondary | ICD-10-CM | POA: Diagnosis not present

## 2019-01-20 DIAGNOSIS — I10 Essential (primary) hypertension: Secondary | ICD-10-CM

## 2019-01-20 DIAGNOSIS — E1159 Type 2 diabetes mellitus with other circulatory complications: Secondary | ICD-10-CM

## 2019-01-20 DIAGNOSIS — E1165 Type 2 diabetes mellitus with hyperglycemia: Principal | ICD-10-CM

## 2019-01-20 DIAGNOSIS — I152 Hypertension secondary to endocrine disorders: Secondary | ICD-10-CM

## 2019-01-20 DIAGNOSIS — Z79891 Long term (current) use of opiate analgesic: Secondary | ICD-10-CM

## 2019-01-20 NOTE — Patient Instructions (Addendum)
1. You have an appt next [place] - 01/27/2019 2. Please return in 7 days with Dr [name] & [name] (already has [facility] appt) for [facility] #1.  3. I resent the script for a new prosthesis   Please record the time, amount and what food drinks and activities you have while wearing the continuous glucose monitor (CGM).  Bring the folder with you to follow up appointments. If your monitor falls off, please place it in the bag provided in your folder and bring it back with you to your next appointment.   Do not have a CT or an MRI while wearing the CGM.   Keep checking blood sugars as you normally do.   [name] Plyler, RD 01/20/2019 9:19 AM

## 2019-01-20 NOTE — Patient Instructions (Signed)
Continuous glucose monitoring instructions put on same  After visit summary with doctor per patient preference.

## 2019-01-20 NOTE — Assessment & Plan Note (Signed)
This is a new problem.  Today's visit was an ER follow-up from January 28 when she was seen and diagnosed with costochondritis.  I reviewed her chest x-ray, EKG, labs, and physician notes.  She was prescribed prednisone 40 mg for 4 days because she got a dose of IV in the ED.  She was also prescribed a nonsteroidal after being given ketorolac in the ED.  Even though she has not gotten the prednisone or nonsteroidal, she has noticed a little bit of improvement from the medication she got in the ED.  However, the pain is still there on the right side and feels like contractions in her chest and they go to the back.  It is constant 24 hours a day and nothing makes it worse or better.  She is in no distress today.  It started on the 27th at 4 AM when it woke her up from sleep suddenly.  She took muscle relaxers, Lyrica, and oxycodone without relief.  We discussed that if she takes prednisone and a nonsteroidal, she needs to take it with food.  I am sending a note to our team to address steroid-induced hyperglycemia.  PLAN : prednisone and NSIAD Steroid-induced hyperglycemia team referral

## 2019-01-20 NOTE — Assessment & Plan Note (Signed)
This problem is chronic and stable.  She recently had a continuous glucose monitor placed but she says it fell off.  Her A1c is 8.2 and will be due at her next appointment.  She is on Tresiba 60 units, metformin XR 500 QD, and semaglutide 0.25 weekly.  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  He works.  She is agreeable to having the CGM replaced today.  She has a follow-up appointment with me in 1 month for download #1 and I will also get her scheduled to see [name] next week.  PLAN : CGM Cont current meds Back of food getting Return to clinic 1 week

## 2019-01-20 NOTE — Assessment & Plan Note (Signed)
This problem is chronic and uncontrolled.  She is working with Miquel Dunn and feels that that is going well.  She has an appointment with her next week.

## 2019-01-20 NOTE — Assessment & Plan Note (Signed)
This problem is chronic and stable.  She is prescribed benazepril 40 mg, amlodipine 10 mg, Atenolol 100 mg, HCTZ 25 mg, and Lasix 40 mg but does not take the Lasix continue to polyuria and urinary incontinence.  Her blood pressure is at goal today.  Her BMP is up-to-date.  PLAN:  Cont current meds   BP Readings from Last 3 Encounters:  01/20/19 123/71  01/18/19 (!) 134/91  12/30/18 (!) 104/58

## 2019-01-20 NOTE — Assessment & Plan Note (Signed)
This problem is chronic and stable.  She has gotten her sleeves and socks and is happy with that.  I had sent in a prescription for a new prosthesis as her current one is to be but she states she has not gotten it.  I resent a new DME order for a prosthesis.  PLAN : F/U next appointment

## 2019-01-20 NOTE — Telephone Encounter (Signed)
Steroid-Induced Hyperglycemia Prevention and Management Jennifer [name] is a 58 y.o. female who meets criteria for [facility] glucose monitoring program (diabetes patient prescribed short course of steroids).  A/P Current Regimen  Patient prescribed prednisone 40 mg daily x 4 days, currently on day 0 of therapy. Patient taking prednisone in the AM  Prednisone indication: for her lungs  Current DM regimen: 60 units u200 Tresiba, 500 mg metformin ER daily, Ozempic 0.25mg  weekly  Home BG Monitoring  Patient does have a meter at home and does check BG at home. Meter was not supplied.  CBGs at home 100-200s, A1C prior to steroid course  Lab Results  Component Value Date   HGBA1C 8.2 (A) 10/21/2018       S/Sx of hyper- or hypoglycemia: none, none  Medication Management  Switch prednisone dose to AM not applicable  Additional treatment for BG control is not indicated at this time.  Physician preference level per protocol: 1  Medication supply Patient will use own medication  Patient Education  Advised patient to monitor BG while on steroid therapy (at least twice daily prior to first 2 meals of the day).  Patient educated about signs/symptoms and advised to contact clinic if hyper- or hypoglycemic.  Patient did  verbalize understanding of information and regimen by repeating back topics discussed.  Follow-up agreed to check blood sugars 3x/day today and tomorrow and start prednisone in am. Will call her tomorrow afternoon.  01/27/19  [name] Makila Colombe 3:51 PM 01/20/2019

## 2019-01-20 NOTE — Progress Notes (Signed)
   Subjective:    [name], female    DOB: 1961-07-28, 58 y.o.   MRN: [id number]  HPI  [name] is here for ER F/U for costochroniditis. Please see the A&P for the status of the pt's chronic medical problems.  ROS : per ROS section and in problem oriented charting. All other systems are negative.  PMHx, Soc hx, and / or Fam hx : still in motel. 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 Otherwise eating sandwiches.    Review of Systems  Constitutional: Negative for fever.  HENT: Negative for rhinorrhea and sore throat.   Eyes: Negative for itching.  Respiratory: Negative for cough.   Cardiovascular: Positive for chest pain.  Endocrine:       No hypoglycemia       Objective:   Physical Exam Vitals signs and nursing note reviewed.  Constitutional:      General: She is not in acute distress.    Appearance: Normal appearance. She is obese. She is not ill-appearing, toxic-appearing or diaphoretic.  HENT:     Head: Normocephalic and atraumatic.     Right Ear: External ear normal.     Left Ear: External ear normal.     Nose: Nose normal. No rhinorrhea.  Eyes:     General:        Right eye: No discharge.        Left eye: No discharge.     Conjunctiva/sclera: Conjunctivae normal.  Cardiovascular:     Rate and Rhythm: Normal rate and regular rhythm.     Heart sounds: No murmur.  Pulmonary:     Effort: Pulmonary effort is normal. No respiratory distress.     Breath sounds: Normal breath sounds. No rhonchi.  Skin:    General: Skin is warm and dry.  Neurological:     General: No focal deficit present.     Mental Status: She is alert. Mental status is at baseline.  Psychiatric:        Mood and Affect: Mood normal.        Behavior: Behavior normal.        Thought Content: Thought content normal.        Judgment: Judgment normal.           Assessment & Plan:

## 2019-01-20 NOTE — Progress Notes (Signed)
Documentation for Freestyle Libre Pro Continuous glucose monitoring Freestyle Libre Pro CGM sensor placed today. Patient was educated about wearing sensor, keeping food, activity and medication log and when to call office. Patient was educated about how to care for the sensor and not to have an MRI, CT or Diathermy while wearing the sensor. Follow up was arranged with the patient for 1 week.   Lot #: [id number] Serial #: [id number] Expiration Date: 05/22/2019  [name], RD 01/20/2019 9:39 AM.

## 2019-01-21 NOTE — Telephone Encounter (Addendum)
Received return call from [employer] at [employer]. He states they received Rx for new leg prosthesis and they are good to go. Message left on patient's phone that this is done and she should be hearing from them soon. [name], RN, BSN

## 2019-01-21 NOTE — Telephone Encounter (Signed)
And I did a second DME order yesterday

## 2019-01-21 NOTE — Telephone Encounter (Signed)
Rx for new prosthesis was addressed at 12/30/2018 OV with PCP. Placed call to [employer] at [employer] [facility]. Message left on his VM asking what more needs to be done so patient may receive her new prosthesis. Awaiting reply. [name], RN, BSN

## 2019-01-21 NOTE — Telephone Encounter (Signed)
Rx for new leg prosthesis written 12/30/2018 along with 1/9 OV notes faxed to [name] at [employer] at [phone]. [name], RN, BSN

## 2019-01-21 NOTE — Telephone Encounter (Signed)
Prevention of Steroid Induced Hyperglycemia: She has not started her steroids as yet. To begin them tomorrow. She was instructed to increase her Tresiba by 10% ( 6 units temporarily) if 2 or more blood sugars are more than 200 mg/dl. She verbalized understanding.  [name], RD 01/21/2019 3:35 PM.

## 2019-01-24 DIAGNOSIS — I38 Endocarditis, valve unspecified: Secondary | ICD-10-CM | POA: Diagnosis not present

## 2019-01-24 DIAGNOSIS — G4733 Obstructive sleep apnea (adult) (pediatric): Secondary | ICD-10-CM | POA: Diagnosis not present

## 2019-01-24 NOTE — Telephone Encounter (Addendum)
Prevention of steroid induced hyperglycemia follow up:  Sugar extremely "high". Took 66 units Tresiba Saturday and Sunday She reports that her Blood sugars were 300s over the weekend.   408 this afternoon 214 today, after eating. Metformin only today waiting to talk to me/us. Plan: increase tresiba another 10% to 72 units today and tomorrow. Will need to wean early because [name] is of very long duration. Call tomorrow.  [name], RD 01/24/2019 4:20 PM.

## 2019-01-25 NOTE — Telephone Encounter (Signed)
Prevention of steroid induced hyperglycemia follow up:  Blood sugar today 449 while on the phone (Not fasting) , has been taking 72 units [place].  Does not think 60 units [name] was working very well before the steroids, but reports her blood sugars were mostly <200 mg/dl 190-160. Today is her last day of steroids.  Plan: she is to inject 72 units tresiba tomorrow and check blood sugar fasting and before lunch. We will talk tomorrow afternoon.  [name], RD 01/25/2019 3:50 PM.

## 2019-01-26 NOTE — Telephone Encounter (Addendum)
Prevention of steroid induced hyperglycemia follow up:   Steroids: stopped yesterday Blood sugar: 179 today after eating Diabetes Medicine: I suggested a decrease  In her tresiba to 66 units tomorrow, but she does not think this is enough. I explained that maybe the semaglutide can be increased to help control her blood sugars instead of insulin. She agreed to hold her [place] dose until tomorrow. Hypoglycemia symptoms: no Hyperglycemia symptoms: no  Follow up: tomorrow and next week CGM downloads. Discontinue this service since blood sugar normalized and steroids stopped.   [name], RD 01/26/2019 4:25 PM.

## 2019-01-27 ENCOUNTER — Encounter: Payer: Self-pay | Admitting: Licensed Clinical Social Worker

## 2019-01-27 ENCOUNTER — Encounter: Payer: Self-pay | Admitting: Dietician

## 2019-01-27 ENCOUNTER — Ambulatory Visit (INDEPENDENT_AMBULATORY_CARE_PROVIDER_SITE_OTHER): Payer: Medicare Other | Admitting: Dietician

## 2019-01-27 ENCOUNTER — Ambulatory Visit: Payer: Medicare Other | Admitting: Licensed Clinical Social Worker

## 2019-01-27 ENCOUNTER — Encounter: Payer: Self-pay | Admitting: Internal Medicine

## 2019-01-27 ENCOUNTER — Ambulatory Visit (INDEPENDENT_AMBULATORY_CARE_PROVIDER_SITE_OTHER): Payer: Medicare Other | Admitting: Internal Medicine

## 2019-01-27 VITALS — BP 139/53 | HR 76

## 2019-01-27 DIAGNOSIS — E08319 Diabetes mellitus due to underlying condition with unspecified diabetic retinopathy without macular edema: Secondary | ICD-10-CM

## 2019-01-27 DIAGNOSIS — E1151 Type 2 diabetes mellitus with diabetic peripheral angiopathy without gangrene: Secondary | ICD-10-CM

## 2019-01-27 DIAGNOSIS — E0865 Diabetes mellitus due to underlying condition with hyperglycemia: Secondary | ICD-10-CM

## 2019-01-27 DIAGNOSIS — E1165 Type 2 diabetes mellitus with hyperglycemia: Secondary | ICD-10-CM

## 2019-01-27 DIAGNOSIS — IMO0002 Reserved for concepts with insufficient information to code with codable children: Secondary | ICD-10-CM

## 2019-01-27 DIAGNOSIS — N907 Vulvar cyst: Secondary | ICD-10-CM | POA: Diagnosis not present

## 2019-01-27 DIAGNOSIS — F321 Major depressive disorder, single episode, moderate: Secondary | ICD-10-CM

## 2019-01-27 MED ORDER — INSULIN DEGLUDEC 200 UNIT/ML ~~LOC~~ SOPN: 66.0000 [IU] | PEN_INJECTOR | Freq: Every day | SUBCUTANEOUS | 2 refills | Status: DC

## 2019-01-27 MED ORDER — LIRAGLUTIDE 18 MG/3ML ~~LOC~~ SOPN: PEN_INJECTOR | SUBCUTANEOUS | 1 refills | Status: DC

## 2019-01-27 NOTE — Progress Notes (Signed)
Diabetes Self-Management Education  Visit Type:  Follow-up  Appt. Start Time: 915 Appt. End Time: 945  01/27/2019  [name], identified by name and date of birth, is a 58 y.o. female with a diagnosis of Diabetes:  .   ASSESSMENT Most time was spent with CGM today Readiness to change: precontemplative/comtemplating Blood sugars very elevated due to steroids x4 days this past week and increased insulin not controlling this rise. Her blood sugars were elevated even on days prior to steroids as she had described on the phone. She had no hypoglycemia and no symptoms of hypoglycemia. No GI problems.   Food intake described at home - 2 liters regular soda per day and oodles of noodles, chips. She only has a microwave right now and gets 26$ of food stamps. She is food insecure. She also reports decreased appetite. We discussed this is a side effect of GLP-1 medicine. She takes a daily multiviamin with minerals that she gets for free from her [employer]. Marland Kitchen   1- Suggest increasing GLP-1 if safe for retinopathy and decreasing insulin Tresiba moderately to 66 units/day    Diabetes Self-Management Education - 01/27/19 1000      Health Coping   How would you rate your overall health?  Good      Psychosocial Assessment   Patient Belief/Attitude about Diabetes  Motivated to manage diabetes    Self-care barriers  Debilitated state due to current medical condition;Lack of material resources    Self-management support  Doctor's office;Family;CDE visits    Patient Concerns  Glycemic Control    Special Needs  None    Preferred Learning Style  No preference indicated    Learning Readiness  Contemplating   in some areas, ready in others     Pre-Education Assessment   Patient understands monitoring blood glucose, interpreting and using results  Needs Instruction    Patient understands prevention, detection, and treatment of acute complications.  Needs Review    Patient understands how to  develop strategies to promote health/change behavior.  Needs Review      Complications   Last HgB A1C per patient/outside source  8.2 %    How often do you check your blood sugar?  1-2 times/day    Fasting Blood glucose range (mg/dL)  180-200;>200    Number of hypoglycemic episodes per month  0    Number of hyperglycemic episodes per week  80    Can you tell when your blood sugar is high?  --    Have you had a dilated eye exam in the past 12 months?  Yes      Dietary Intake   Breakfast  reg soda    Snack (morning)  reg soda    Lunch  oodles of noodles,     Snack (afternoon)  reg soda    Dinner  chips, reg soda    Beverage(s)  2 liters/day reg soda      Exercise   Exercise Type  ADL's   in a wheelchair can walk     Patient Education   Monitoring  Other (comment)   how to interpret a CGM download   Acute complications  Discussed and identified patients' treatment of hyperglycemia.    Personal strategies to promote health  Helped patient develop diabetes management plan for (enter comment)      Outcomes   Program Status  Not Completed      Subsequent Visit   Since your last visit have you continued  or begun to take your medications as prescribed?  Yes    Since your last visit have you had your blood pressure checked?  Yes    Since your last visit have you experienced any weight changes?  No change    Since your last visit, are you checking your blood glucose at least once a day?  Yes       Learning Objective:  Patient will have a greater understanding of diabetes self-management. Patient education plan is to attend individual and/or group sessions per assessed needs and concerns.   Plan:   Patient Instructions  Hi Jennifer [name],   Thank you for coming today!  1- I encourage you to eat more protein foods- like eggs in a mug, peanut butter, tuna, chicken, [place]  3- Also consider drinking tea, coffee, water more often to help reduce your soda intake  See you next week.    [name] [phone]   Expected Outcomes:  Demonstrated interest in learning. Expect positive outcomes  Education material provided: Support group flyer  If problems or questions, patient to contact team via:  Phone  Future DSME appointment: - Other (comment)(1 week)  [name], RD 01/27/2019 11:13 AM.

## 2019-01-27 NOTE — Patient Instructions (Addendum)
1. Please return in 7 days with [name] for Download #2.  2. Please see me in 1 month 3. I am glad to hear your good news about an apartment! 4. Good job on your blood pressure! 5, I am sending you to gyn to see about removing the cysts

## 2019-01-27 NOTE — Progress Notes (Signed)
   Subjective:    [name], female    DOB: June 10, 1961, 58 y.o.   MRN: [id number]  HPI  Jennifer [name] is here for DM F/U. Please see the A&P for the status of the pt's chronic medical problems.  ROS : per ROS section and in problem oriented charting. All other systems are negative.  PMHx, Soc hx, and / or Fam hx : She states her son has located in an apartment and they are to move this weekend.  She does not know if there are steps up to the front door.  It is a 2 floor apartment with a bathroom on the main floor but all of the bedrooms are upstairs.  She states she will be able to walk up the stairs safely and walk around the upstairs.  It will have the kitchen and she is able to stand up long enough to do basic cooking.   Review of Systems  Cardiovascular: Negative for chest pain.  Genitourinary: Positive for genital sores.       Objective:   Physical Exam Constitutional:      General: She is not in acute distress.    Appearance: Normal appearance. She is obese. She is not ill-appearing, toxic-appearing or diaphoretic.  HENT:     Head: Normocephalic and atraumatic.     Right Ear: External ear normal.     Left Ear: External ear normal.     Nose: Nose normal. No congestion or rhinorrhea.  Eyes:     General:        Right eye: No discharge.        Left eye: No discharge.     Conjunctiva/sclera: Conjunctivae normal.  Genitourinary:    Comments: Vulvar : On labia majora there is a 5 mm firm nodule under the skin with a pinpoint head.  There is no erythema or exquisite tenderness.  There is no drainage with pressure.  The entire vulva and inner thigh area is slightly hyperpigmented but not consistent with candidiasis or cellulitis.  There is no odor or spontaneous urine leakage. Skin:    General: Skin is warm and dry.  Neurological:     General: No focal deficit present.     Mental Status: She is alert. Mental status is at baseline.  Psychiatric:        Mood  and Affect: Mood normal.        Behavior: Behavior normal.        Thought Content: Thought content normal.        Judgment: Judgment normal.           Assessment & Plan:

## 2019-01-27 NOTE — Assessment & Plan Note (Addendum)
This problem is chronic and uncontrolled.  Her most recent A1c was 8.2 3 months ago.  She is on [place] which has been increased from 60 to 72 units due to our steroid-induced hyperglycemia protocol.  She is also on Metformin XR 500 daily and semaglutide 0.25 weekly with her most recent dose yesterday.  She was able to do a CBG monitor and 83% of her readings are above target.  Even before she started the prednisone, she was still quite hyperglycemic.  She has not taken facility which are adequate at the motel and she is staying but hopefully she will be moving to an apartment this weekend which will help better cooking facilities.  She is now off the prednisone which will also help her hypoglycemia.  We collaborated with [name], our diabetes educator, and will change her [name] to 66 units.  I also with stop the semaglutide because her eye examination in 2015 showed retinopathy although her eye exam in 2017 did not show any retinopathy.  Ms. Gregg stated she could do daily injections so we will use Victoza 0.6 daily for a week then increase to 1.2 daily for a week and then increase to 1.8 daily as I assume she will need a maximum dose to help with her diabetes control.  [name] wore the CGM for 8 days. The average reading was 253, % time in target was 17, % time below target was 0, and % time above target was. 83. Intervention will be to change her Tresiba to 66 units, stop semaglutide, start Victoza at 0.6 daily starting the 12th, and continue metformin XR 500 daily.. The patient will be scheduled to see [name] for a final appointment.   PLAN :  Stop semaglutide Victoza titration up Continue metformin Tresiba 66 units a day

## 2019-01-27 NOTE — Assessment & Plan Note (Signed)
This is a new problem.  She complains of boils in her female areas.  Additionally, 1 month ago she had it in her rear end.  On examination, she has a 5 mm subcutaneous firm nodule on her left labia majora.  There is no discrete erythema or edema or tenderness.  There is a central head but no discharge even with pressure. She states that these are recurrent but not always in the same location.  I am referring her to gynecology for further examination and work-up.  PLAN : gyn referral

## 2019-01-27 NOTE — BH Specialist Note (Signed)
Integrated Behavioral Health Follow Up Visit  MRN: [id number] Name: Jennifer [name]  Number of [place] Clinician visits: 5/6 Session Start time: 11:00  Session End time: 11;40 Total time: 40 minutes  Type of Service: [place] Interpretor:No.   SUBJECTIVE: Jennifer [name] is a 58 y.o. female  whom attended the session individually.  Patient was referred by Dr. [name] for depression. Patient reports the following symptoms/concerns: decreased depression, improved living situation, and mild anxiety around interpersonal relationships.  Duration of problem: two years; Severity of problem: mild  OBJECTIVE: Mood: Netural and Affect: Appropriate Risk of harm to self or others: No plan to harm self or others  LIFE CONTEXT: Family and Social: Patient reported that she has found an apartment to move into, and plans to move within the next week or so. Patient reported her two sons will be moving with her. Patient is concerned about one of her son's ability to pay the rent on a regular basis due to concerns he could have substance use issues. Patient reported that her father is selling her childhood home, and the patient is grieving the loss of no longer being able to visit her family home.  Self-Care: Improved over the last two sessions.  Life Changes: Patient will be moving out of the hotel she has been staying in, and into an apartment.   GOALS ADDRESSED: Patient will: 1.  Reduce symptoms of: anxiety and depression  2.  Increase knowledge and/or ability of: coping skills, healthy habits and stress reduction  3.  Demonstrate ability to: Increase healthy adjustment to current life circumstances and Increase adequate support systems for patient/family  INTERVENTIONS: Interventions utilized:  Brief CBT and Supportive Counseling. CBT was used to explore the patient's thoughts around changes out of her control, and to identify how she can  challenge her thought patterns to engage in healthy behaviors/responses.  Standardized Assessments completed: assessed for SI, HI, and self-harm.  ASSESSMENT: Patient currently experiencing decreased anxiety and depressive symptoms. Patient will be moving in the next week or so. Patient reported feeling more hopeful for the future, and was able to identify strengths she has used in order to push through her depression. Patient's thoughts have changed to a more positive mindset. Patient does have concerns about her son's life choices, and has not addressed them with him at this time.     Patient may benefit from bi-weekly outpatient therapy.  PLAN: 1. Follow up with behavioral health clinician on : one to two weeks.   Dessie Coma, LPC, LCAS

## 2019-01-27 NOTE — Patient Instructions (Addendum)
Hi Jennifer [name],   Thank you for coming today!  1- I encourage you to eat more protein foods- like eggs in a mug, peanut butter, tuna, chicken, [place]  3- Also consider drinking tea, coffee, water more often to help reduce your soda intake  See you next week.   [name] [phone]

## 2019-02-03 ENCOUNTER — Ambulatory Visit (INDEPENDENT_AMBULATORY_CARE_PROVIDER_SITE_OTHER): Payer: Medicare Other | Admitting: Licensed Clinical Social Worker

## 2019-02-03 ENCOUNTER — Ambulatory Visit (INDEPENDENT_AMBULATORY_CARE_PROVIDER_SITE_OTHER): Payer: Medicare Other | Admitting: Dietician

## 2019-02-03 ENCOUNTER — Encounter: Payer: Self-pay | Admitting: Dietician

## 2019-02-03 ENCOUNTER — Encounter: Payer: Self-pay | Admitting: Licensed Clinical Social Worker

## 2019-02-03 DIAGNOSIS — IMO0002 Reserved for concepts with insufficient information to code with codable children: Secondary | ICD-10-CM

## 2019-02-03 DIAGNOSIS — F321 Major depressive disorder, single episode, moderate: Secondary | ICD-10-CM

## 2019-02-03 DIAGNOSIS — E118 Type 2 diabetes mellitus with unspecified complications: Secondary | ICD-10-CM

## 2019-02-03 DIAGNOSIS — E1165 Type 2 diabetes mellitus with hyperglycemia: Principal | ICD-10-CM

## 2019-02-03 DIAGNOSIS — E1151 Type 2 diabetes mellitus with diabetic peripheral angiopathy without gangrene: Secondary | ICD-10-CM | POA: Diagnosis not present

## 2019-02-03 DIAGNOSIS — Z713 Dietary counseling and surveillance: Secondary | ICD-10-CM

## 2019-02-03 NOTE — Patient Instructions (Addendum)
Your blood sugars are improving this week!  The report showed low blood sugars today and yesterday and almost low a few times this week.   Reduce the Tresiba back to 60 units each day because you are going to start Victoza as discussed last week.    Please bring your meter so we can see how your blood sugars are doing on Victoza.  Thank you!  [name] [phone]

## 2019-02-03 NOTE — BH Specialist Note (Signed)
Integrated Behavioral Health Follow Up Visit  MRN: [id number] Name: Jennifer [name]  Number of [place] [facility] Clinician visits: 6/6 Session Start time: 9:20  Session End time: 10:00 Total time: 40 minutes  Type of Service: [place] Interpretor:No.  SUBJECTIVE: Jennifer [name] is a 58 y.o. female y whom attended the session individually.  Patient was referred by Dr. [name] for depression. Patient reports the following symptoms/concerns: decreased depression, interpersonal relationship issues, mild anxiety, and chronic health issues.  Duration of problem: over two years; Severity of problem: mild  OBJECTIVE: Mood: Netural and Affect: Appropriate Risk of harm to self or others: No plan to harm self or others  LIFE CONTEXT: Family and Social: Patient reported that she will be moving "hopefully" tomorrow into a new apartment with her two sons, and her son's girlfriend. Patient reported she truly wants to live alone, but she is having difficulties gaining an apartment due to a past eviction on her credit. Patient continues to have concerns about her oldest son, and reported that causes her to be anxious/irritable. Patient plans to begin engaging in an [employer] group next month as a way to socialize with others. Patient reported she has not been in a romantic relationship since 2014, and would like to meet someone.  Self-Care: Patient reported that she wants to gain an electric wheelchair to increase her level of independence. At this time, the patient requires someone to push her around, and this is limiting her ability to leave her home.  Life Changes: Patient will be moving out of the motel she has been staying in for the past several months into an apartment. Her father is selling her family home.   GOALS ADDRESSED: Patient will: 1.  Reduce symptoms of: anxiety, depression and stress  2.  Increase knowledge and/or ability of: coping  skills, healthy habits and stress reduction  3.  Demonstrate ability to: Increase healthy adjustment to current life circumstances and Increase adequate support systems for patient/family  INTERVENTIONS: Interventions utilized:  Motivational Interviewing, Supportive Counseling and Link to [employer] Standardized Assessments completed: Not Needed  ASSESSMENT: Patient currently experiencing mixed emotions. The patient reported feeling relieved that she will be moving into a more permanent housing option, however is sad about her inability to live alone.  Patient plans to begin looking for an income based apartment for herself only, once she becomes settled into her new environment. Patient continues to have struggles with both of her sons, but she is not willing to make any changes towards them.   Patient may benefit from bi-weekly outpatient therapy.   PLAN: 1. Follow up with behavioral health clinician on : two weeks.   2. Referral(s): [employer] Resources:  Housing   [place], [place], [place]

## 2019-02-03 NOTE — Progress Notes (Signed)
Diabetes Self-Management Education  Visit Type:  Follow-up  Appt. Start Time: 845 Appt. End Time: 449  02/03/2019  [name], identified by name and date of birth, is a 58 y.o. female with a diagnosis of Diabetes:  Marland Kitchen  Typ 2 diabetes  ASSESSMENT Sample of Victoza provided today. Bag of groceries from our pantry provided.   CGM Results from Download #2 Average is  222  for 14 days Glucose Management Indicator: 8.6% Time in range (70-180 mg/dL): 29% (Goal >70%) Time above range (>180 mg/dL) 68% (Goal is <25%) Time below range (<70 mg/dL): 3% (Goal is <4%) Coefficient of variation:36.3 % (Goal is <36%) Noted patterns:  Blood sugars dramatically improved the last few days on only metformin and 66 units of Tresiba. Semaglutide late taken last week so that may also be having an affect. Low blood sugars occurred between 6-11 am. Highest risk of hypoglycemia between 3 AM and 8 AM  Diabetes Self-Management Education - 02/03/19 1000      Health Coping   How would you rate your overall health?  Good      Patient Education   Monitoring  Identified appropriate SMBG and/or A1C goals.    Acute complications  Taught treatment of hypoglycemia - the 15 rule.    Psychosocial adjustment  Worked with patient to identify barriers to care and solutions;Role of stress on diabetes    Personal strategies to promote health  Helped patient develop diabetes management plan for (enter comment)   obtaining medicines     Individualized Goals (developed by patient)   Medications  take my medication as prescribed      Patient Self-Evaluation of Goals - Patient rates self as meeting previously set goals (% of time)   Medications  50 - 75 %   takes metformin, tresiba, cannot afford victoza     Outcomes   Program Status  Completed      Subsequent Visit   Since your last visit have you continued or begun to take your medications as prescribed?  No   see below   Since your last visit, are you checking  your blood glucose at least once a day?  Yes       Learning Objective:  Patient will have a greater understanding of diabetes self-management. Patient education plan is to attend individual and/or group sessions per assessed needs and concerns.  Plan:   Patient Instructions  Your blood sugars are improving this week!  The report showed low blood sugars today and yesterday and almost low a few times this week.   Reduce the Tresiba back to 60 units each day because you are going to start Victoza as discussed last week.    Expected Outcomes:  Demonstrated interest in learning. Expect positive outcomes Education material provided:  After visit summary If problems or questions, patient to contact team via:  Phone Future DSME appointment: - 2 wks  [name], RD 02/03/2019 10:42 AM.

## 2019-02-09 ENCOUNTER — Telehealth: Payer: Self-pay

## 2019-02-09 NOTE — Telephone Encounter (Signed)
Requesting a call back.  

## 2019-02-17 ENCOUNTER — Ambulatory Visit: Payer: Self-pay | Admitting: Licensed Clinical Social Worker

## 2019-02-17 ENCOUNTER — Encounter: Payer: Self-pay | Admitting: Dietician

## 2019-02-22 ENCOUNTER — Telehealth: Payer: Self-pay | Admitting: *Deleted

## 2019-02-22 ENCOUNTER — Ambulatory Visit (INDEPENDENT_AMBULATORY_CARE_PROVIDER_SITE_OTHER): Payer: Medicare Other | Admitting: Dietician

## 2019-02-22 ENCOUNTER — Ambulatory Visit (INDEPENDENT_AMBULATORY_CARE_PROVIDER_SITE_OTHER): Payer: Medicare Other | Admitting: Licensed Clinical Social Worker

## 2019-02-22 ENCOUNTER — Encounter: Payer: Self-pay | Admitting: Dietician

## 2019-02-22 VITALS — BP 158/67 | HR 70 | Temp 97.5°F | Wt 257.6 lb

## 2019-02-22 DIAGNOSIS — Z6841 Body Mass Index (BMI) 40.0 and over, adult: Secondary | ICD-10-CM

## 2019-02-22 DIAGNOSIS — E1151 Type 2 diabetes mellitus with diabetic peripheral angiopathy without gangrene: Secondary | ICD-10-CM

## 2019-02-22 DIAGNOSIS — G4733 Obstructive sleep apnea (adult) (pediatric): Secondary | ICD-10-CM | POA: Diagnosis not present

## 2019-02-22 DIAGNOSIS — B951 Streptococcus, group B, as the cause of diseases classified elsewhere: Secondary | ICD-10-CM | POA: Diagnosis not present

## 2019-02-22 DIAGNOSIS — Z89519 Acquired absence of unspecified leg below knee: Secondary | ICD-10-CM

## 2019-02-22 DIAGNOSIS — L03119 Cellulitis of unspecified part of limb: Secondary | ICD-10-CM | POA: Diagnosis not present

## 2019-02-22 DIAGNOSIS — E1165 Type 2 diabetes mellitus with hyperglycemia: Secondary | ICD-10-CM | POA: Diagnosis not present

## 2019-02-22 DIAGNOSIS — IMO0002 Reserved for concepts with insufficient information to code with codable children: Secondary | ICD-10-CM

## 2019-02-22 DIAGNOSIS — Z713 Dietary counseling and surveillance: Secondary | ICD-10-CM

## 2019-02-22 DIAGNOSIS — F321 Major depressive disorder, single episode, moderate: Secondary | ICD-10-CM

## 2019-02-22 LAB — POCT GLYCOSYLATED HEMOGLOBIN (HGB A1C): Hemoglobin A1C: 9.3 % — AB (ref 4.0–5.6)

## 2019-02-22 LAB — GLUCOSE, CAPILLARY
Glucose-Capillary: 442 mg/dL — ABNORMAL HIGH (ref 70–99)
Glucose-Capillary: 263 mg/dL — ABNORMAL HIGH (ref 70–99)

## 2019-02-22 NOTE — Telephone Encounter (Signed)
Patient in office with Diabetic Educator. Requesting a new leg lift for right side of manual w/c only. Patient received w/c from [facility]. Will route to PCP. [name], RN, BSN

## 2019-02-22 NOTE — Progress Notes (Signed)
Diabetes Self-Management Education  Visit Type:  Follow-up  Appt. Start Time: 1415    Appt. End Time: 3235  02/22/2019  [name], identified by name and date of birth, is a 58 y.o. female with a diagnosis of Diabetes:  .    ASSESSMENT  Patient presents without a meter and says she needs one. She says she ate a cheeseburger and fries for lunch and drinking a large regular orange soda. She reports that she has been getting nausea and heartburn every time she eats for the past month; has been eating very small meals. She is now living with her sister in a house.   Says she has not been testing blood sugar since she lost her meter in storage. Provided her with new meter today and synced it with her smart phone.   A1C today was increased today. She was recently on steroids that increased her blood sugar. Encouraged her to drink more water especially when blood sugars are high.   Coordinated care with Dr. Maudie Mercury who suggested decreasing the Victoza to 1.2 mg due to patient's continued complaints of nausea and heart burn.  She reports taking increased levels of insulin for the past few days. Discussed that she plans to continue with 70 units daily.    Diabetes Self-Management Education - 02/22/19 1500      Pre-Education Assessment   Patient understands monitoring blood glucose, interpreting and using results  Needs Review      Complications   Last HgB A1C per patient/outside source  9.3 %    How often do you check your blood sugar?  0 times/day (not testing)      Dietary Intake   Lunch  --   cheeseburger, fries    Dinner  chicken drumstick and rice    Beverage(s)  --   regular soda     Patient Education   Monitoring  Taught/evaluated SMBG meter.;Purpose and frequency of SMBG.;Yearly dilated eye exam      Individualized Goals (developed by patient)   Monitoring   test my blood glucose as discussed   2x day     Patient Self-Evaluation of Goals - Patient rates self as meeting  previously set goals (% of time)   Medications  >75%      Outcomes   Program Status  Completed      Subsequent Visit   Since your last visit have you continued or begun to take your medications as prescribed?  Yes    Since your last visit have you had your blood pressure checked?  Yes    Since your last visit have you experienced any weight changes?  Loss    Weight Loss (lbs)  2    Since your last visit, are you checking your blood glucose at least once a day?  No      Learning Objective:  Patient will have a greater understanding of diabetes self-management. Patient education plan is to attend individual and/or group sessions per assessed needs and concerns.  Plan:   Patient Instructions  Please make an appointment with Dr. [name].    DECREASE the VICTOZA to 1.2mg  each day.   DRINK as much water as you can.  Check blood sugar two times a day AND bring meter to your visits.  You are signed up for March 19 group diabetes meeting at 10:15 AM.  Eye exam Appointment with Dr.Oman - call her to schedule at [place] [phone]     Expected Outcomes:  Demonstrated interest in learning. Expect positive outcomesDemonstrated interest in learning.   Education material provided: Support group flyer  If problems or questions, patient to contact team via:  Phone  Future DSME appointment: - 2 wks(plans to attend group)2 weeks (plans to attend group)  Time spent in education: 45 minutes    [name], RD 02/22/2019 4:06 PM.

## 2019-02-22 NOTE — Patient Instructions (Addendum)
Please make an appointment with Dr. [name].    DECREASE the VICTOZA to 1.2mg  each day.   DRINK as much water as you can.  Check blood sugar two times a day AND bring meter to your visits.  You are signed up for March 19 group diabetes meeting at 10:15 AM.  Eye exam Appointment with Dr.Oman - call her to schedule at [place] [phone]

## 2019-02-23 ENCOUNTER — Encounter: Payer: Self-pay | Admitting: Licensed Clinical Social Worker

## 2019-02-23 NOTE — BH Specialist Note (Signed)
Integrated Behavioral Health Follow Up Visit  MRN: [id number] Name: Jennifer [name]  Number of [place] Clinician visits: 7 Session Start time: 11:00 Session End time: 11;30 Total time: 30 minutes  Type of Service: [place] Interpretor:No.   SUBJECTIVE: Jennifer [name] is a 57 y.o. female  whom attended the session individually.  Patient was referred by Dr. [name] for depression. Patient reports the following symptoms/concerns: increased depression, anxiety, lack of resources, family conflict, and health concerns.  Duration of problem: over two years; Severity of problem: moderate  OBJECTIVE: Mood: Negative and Depressed and Affect: Depressed Risk of harm to self or others: No plan to harm self or others  LIFE CONTEXT: Family and Social: Patient reported that her and her two sons lost their motel since their last visit. Patient attempted to find housing at the local shelters, but reported there was no room at the shelter. Patient was allowed to move in with her niece. Patient is angry at her father for not providing her with a place to live. Patient continues to report negativity between her sons and herself, but is not willing to live without them. Self-Care: Patient is not making her health a priority. Patient's blood sugar was elevated at her appointment, and the patient reported she is not following her suggested meal plan as she should.  Life Changes: Patient was evicted from her motel, and is now living in her niece's home. Patient feels she is currently "homeless".   GOALS ADDRESSED: Patient will: 1.  Reduce symptoms of: agitation, anxiety, depression and mood instability  2.  Increase knowledge and/or ability of: coping skills, healthy habits and stress reduction  3.  Demonstrate ability to: Increase healthy adjustment to current life circumstances, Increase adequate support systems for patient/family and Increase motivation  to adhere to plan of care  INTERVENTIONS: Interventions utilized:  Brief CBT and Supportive Counseling Standardized Assessments completed: assessed for SI, HI, and self-harm.  ASSESSMENT: Patient currently experiencing increased depression, anxiety, and agitation. Patient is angry that her family is not coming together to support her. Patient was cussing in the session, and having difficulty controlling her emotions.    Patient may benefit from bi-weekly outpatient therapy.  PLAN: 1. Follow up with behavioral health clinician on : two weeks.   Dessie Coma, LPC, LCAS

## 2019-02-24 NOTE — Telephone Encounter (Signed)
Jennifer [name] at [facility] notified via [employer] that order has been placed in [place] for leg lift for right side of manual w/c. [name], RN, BSN

## 2019-03-03 ENCOUNTER — Ambulatory Visit (INDEPENDENT_AMBULATORY_CARE_PROVIDER_SITE_OTHER): Payer: Medicare Other | Admitting: Licensed Clinical Social Worker

## 2019-03-03 ENCOUNTER — Other Ambulatory Visit: Payer: Self-pay

## 2019-03-03 ENCOUNTER — Encounter: Payer: Self-pay | Admitting: Internal Medicine

## 2019-03-03 ENCOUNTER — Other Ambulatory Visit: Payer: Self-pay | Admitting: *Deleted

## 2019-03-03 ENCOUNTER — Encounter: Payer: Self-pay | Admitting: Licensed Clinical Social Worker

## 2019-03-03 ENCOUNTER — Ambulatory Visit (INDEPENDENT_AMBULATORY_CARE_PROVIDER_SITE_OTHER): Payer: Medicare Other | Admitting: Internal Medicine

## 2019-03-03 DIAGNOSIS — Z639 Problem related to primary support group, unspecified: Secondary | ICD-10-CM

## 2019-03-03 DIAGNOSIS — J449 Chronic obstructive pulmonary disease, unspecified: Secondary | ICD-10-CM

## 2019-03-03 DIAGNOSIS — Z720 Tobacco use: Secondary | ICD-10-CM

## 2019-03-03 DIAGNOSIS — Z599 Problem related to housing and economic circumstances, unspecified: Secondary | ICD-10-CM

## 2019-03-03 DIAGNOSIS — Z89519 Acquired absence of unspecified leg below knee: Secondary | ICD-10-CM

## 2019-03-03 DIAGNOSIS — F332 Major depressive disorder, recurrent severe without psychotic features: Secondary | ICD-10-CM

## 2019-03-03 DIAGNOSIS — Z598 Other problems related to housing and economic circumstances: Secondary | ICD-10-CM

## 2019-03-03 DIAGNOSIS — I152 Hypertension secondary to endocrine disorders: Secondary | ICD-10-CM

## 2019-03-03 DIAGNOSIS — E1151 Type 2 diabetes mellitus with diabetic peripheral angiopathy without gangrene: Secondary | ICD-10-CM

## 2019-03-03 DIAGNOSIS — Z89512 Acquired absence of left leg below knee: Secondary | ICD-10-CM

## 2019-03-03 DIAGNOSIS — E1165 Type 2 diabetes mellitus with hyperglycemia: Principal | ICD-10-CM

## 2019-03-03 DIAGNOSIS — E1159 Type 2 diabetes mellitus with other circulatory complications: Secondary | ICD-10-CM | POA: Diagnosis not present

## 2019-03-03 DIAGNOSIS — Z6841 Body Mass Index (BMI) 40.0 and over, adult: Secondary | ICD-10-CM

## 2019-03-03 DIAGNOSIS — IMO0002 Reserved for concepts with insufficient information to code with codable children: Secondary | ICD-10-CM

## 2019-03-03 DIAGNOSIS — I10 Essential (primary) hypertension: Secondary | ICD-10-CM

## 2019-03-03 DIAGNOSIS — Z794 Long term (current) use of insulin: Secondary | ICD-10-CM

## 2019-03-03 DIAGNOSIS — J42 Unspecified chronic bronchitis: Secondary | ICD-10-CM

## 2019-03-03 DIAGNOSIS — F321 Major depressive disorder, single episode, moderate: Secondary | ICD-10-CM

## 2019-03-03 DIAGNOSIS — Z79899 Other long term (current) drug therapy: Secondary | ICD-10-CM

## 2019-03-03 MED ORDER — OMEPRAZOLE 20 MG PO CPDR: 20.0000 mg | DELAYED_RELEASE_CAPSULE | Freq: Two times a day (BID) | ORAL | 3 refills | Status: DC

## 2019-03-03 NOTE — Assessment & Plan Note (Signed)
This problem is chronic and unchanged.  She has not yet gotten her leg left for her wheelchair but states it has been work done.  She is working with our tech to get a new prosthetic and is supposed to go and sometimes get a cast made.  I will reach out to [facility] to see if we need to restart the process to get a power chair or whether we can resume where we left off.

## 2019-03-03 NOTE — Telephone Encounter (Signed)
Requesting heart burn med, please call pt back.

## 2019-03-03 NOTE — Assessment & Plan Note (Signed)
This problem is chronic and stable.  She states she has had a cough for 3 weeks which is productive of phlegm.  Her only other symptoms include sinus pressure and shortness of breath.  She has no fever.  Her exam is normal although she is coughing occasionally during the examination.  She is an ongoing smoker and we discussed that she likely had a viral URI and the cough is going to be the last symptom to resolve due to her COPD and tobacco use.  PLAN: follow

## 2019-03-03 NOTE — Telephone Encounter (Signed)
Sent request in new encounter 

## 2019-03-03 NOTE — Assessment & Plan Note (Signed)
This problem is chronic and uncontrolled.  She was supposed to be on Tresiba 70 units once a day, metformin XR 500 QD, and Victoza 1.2.  She states that since she started the Victoza, EHR indicates it was started about 1 month ago, she has had inability to walk, is weak all over, slumps down to the floor when she gets to the bathroom.  She states that she has had the symptoms for about 2 weeks but then she stopped the Victoza 1 week ago and states that she is a little bit better.  So the timing does work out to starting [employer].  I looked at the side effects of Victoza and there is a less than 10% incidence of dizziness.  She states that since she stopped the Victoza, her sugars have been in the 400s.  She states that she increased her Tresiba to 80 units to compensate.  We discussed options going forward and she wanted to try a different GLP-1 and I collaborated with our clinical pharmacist, Vonna Kotyk., who is going to investigate her insurance and see which one is on her formulary.  PLAN : Continue Tresiba 80 units Continue metformin XR 500 QD Stop Victoza Start a different GLP-1 based on insurance formulary

## 2019-03-03 NOTE — Assessment & Plan Note (Signed)
This problem is chronic and stable.  Her blood pressure is well controlled on her current regimen and has been for several visits.  PLAN:  Cont current meds

## 2019-03-03 NOTE — BH Specialist Note (Signed)
Integrated Behavioral Health Follow Up Visit  MRN: [id number] Name: Jennifer [name]  Number of [place] Clinician visits: 8 Session Start time: 9:05  Session End time: 9:30 Total time: 25 minutes  Type of Service: [place] Interpretor:No.   SUBJECTIVE: Jennifer [name] is a 58 y.o. female  whom attended the session individually. Patient was referred by Dr. [name] for depression. Patient reports the following symptoms/concerns: decreased depression since the previous session, ongoing stress related to housing, health concerns, and difficulties with family dynamics.  Duration of problem: over two years; Severity of problem: moderate  OBJECTIVE: Mood: Negative and Affect: Depressed Risk of harm to self or others: No plan to harm self or others  LIFE CONTEXT: Family and Social: Patient is continuing to live with her niece. Patient reported she is hopeful that she will find permeant housing in the next month. Patient is continuing to have anger towards her father for his lack of support while she was homeless. Patient is not willing to forgive him at this time. Patient processed missing her grandchildren, and reported that one of her grandchildren might be moving in with her soon. Patient reported that both of her sons currently owe her money. Today is the patient's birthday, and she does not think that anyone in her family will do anything to celebrate her day. Self-Care: Patient needs to continue to focus on her health. Patient is eating more regularly since moving out of her hotel room.  Life Changes: Patient's housing is temporary, and the patient is currently looking for a permanent apartment.   GOALS ADDRESSED: Patient will: 1.  Reduce symptoms of: agitation, anxiety and depression  2.  Increase knowledge and/or ability of: coping skills, healthy habits and stress reduction  3.  Demonstrate ability to: Increase healthy adjustment to  current life circumstances, Increase adequate support systems for patient/family and Increase motivation to adhere to plan of care  INTERVENTIONS: Interventions utilized:  Motivational Interviewing and Supportive Counseling. MI was used to identify how the patient is coping with her anxiety in healthy ways. Therapist reviewed the patient's plan for housing.  Standardized Assessments completed: assessed for SI, HI, and self-harm.  ASSESSMENT: Patient currently experiencing ongoing stress related symptoms.  Patient is continuing to have irritability and negative thoughts around her family. Patient's depression has decreased since her last appointment, and the patient appears to be adjusting to her current living environment.    Patient may benefit from bi-weekly outpatient therapy.   PLAN: 1. Follow up with behavioral health clinician on : two weeks.  2.   Dessie Coma, LPC, LCAS

## 2019-03-03 NOTE — Assessment & Plan Note (Addendum)
This problem is chronic and uncontrolled.  She will not be able to get her weight under control until her mental status is improved, her unstable housing is settled, and financial issues are improved.  PLAN: follow

## 2019-03-03 NOTE — Progress Notes (Signed)
   Subjective:    [name], female    DOB: 18-Mar-1961, 58 y.o.   MRN: [id number]  HPI  ALAYZIAH TANGEMAN is here for DM F/U. Please see the A&P for the status of the pt's chronic medical problems.  ROS : per ROS section and in problem oriented charting. All other systems are negative.  PMHx, Soc hx, and / or Fam hx : She got kicked out of the motel where she was staying.  She is now staying with a niece.  She would like to pursue getting her wheelchair.  We had started this process a year or 2 ago but she then stated she was unable to afford the co-pay.  Review of Systems  Constitutional: Negative for appetite change and fever.  HENT: Positive for sinus pressure. Negative for rhinorrhea and sneezing.   Respiratory: Positive for cough and shortness of breath.   Musculoskeletal: Positive for gait problem.       Objective:   Physical Exam Constitutional:      General: She is not in acute distress.    Appearance: She is obese. She is not ill-appearing, toxic-appearing or diaphoretic.  HENT:     Head: Normocephalic and atraumatic.     Right Ear: External ear normal.     Left Ear: External ear normal.     Nose: No congestion or rhinorrhea.     Mouth/Throat:     Mouth: Mucous membranes are moist.     Pharynx: Oropharynx is clear. No posterior oropharyngeal erythema.  Eyes:     General:        Right eye: No discharge.        Left eye: No discharge.     Conjunctiva/sclera: Conjunctivae normal.  Cardiovascular:     Rate and Rhythm: Normal rate and regular rhythm.     Heart sounds: No murmur.  Pulmonary:     Effort: Pulmonary effort is normal. No respiratory distress.     Breath sounds: Normal breath sounds. No rhonchi.  Musculoskeletal:        General: Swelling present. No tenderness.     Comments: Nonpitting edema of the right lower extremity unchanged  Skin:    General: Skin is warm and dry.  Neurological:     General: No focal deficit present.     Mental  Status: She is alert.     Comments: No nystagmus  Psychiatric:        Mood and Affect: Mood normal.        Behavior: Behavior normal.        Thought Content: Thought content normal.        Judgment: Judgment normal.           Assessment & Plan:

## 2019-03-03 NOTE — Assessment & Plan Note (Signed)
This problem is chronic and uncontrolled.  She is working with Miquel Dunn, our counselor, but is still struggling with multiple issues including unstable housing, financial restraints, and family issues.  PLAN : follow Jennifer [name]'s notes

## 2019-03-07 ENCOUNTER — Ambulatory Visit: Payer: Medicare Other | Admitting: Obstetrics and Gynecology

## 2019-03-07 NOTE — Addendum Note (Signed)
Addended by: Hulan Fray on: 03/07/2019 07:15 PM   Modules accepted: Orders

## 2019-03-08 ENCOUNTER — Telehealth: Payer: Self-pay | Admitting: *Deleted

## 2019-03-08 NOTE — Telephone Encounter (Signed)
Mobility assessment for power w/c done 01/13/2017. Have spoken with Andria Rhein who will let us know if process needs to be reinitiated. Have faxed OV notes from 01/13/2017 to [facility] as she is not currently on Epic during [facility] buyout transition. [name], RN, BSN

## 2019-03-08 NOTE — Telephone Encounter (Signed)
Had not heard back from Andria Rhein with [facility] via community message. Spoke with Jackelyn Poling today who states she currently does not have access to Epic during [facility] buyout transition. Have faxed order, demographics, OV notes from 03/03/2019 to [facility] at [phone]. [name], RN, BSN

## 2019-03-08 NOTE — Telephone Encounter (Signed)
-----   Message from Bartholomew Crews, MD sent at 03/08/2019 11:05 AM EDT ----- Pt had powerchair eval on 05/27/18 and I think got all the way to having chair done but then pt stated she didn't have the co-pay. She wants to resume the process. Do we start over or can we resume from the 05/27/18 appt?Thanks

## 2019-03-09 ENCOUNTER — Other Ambulatory Visit: Payer: Self-pay

## 2019-03-09 ENCOUNTER — Ambulatory Visit (INDEPENDENT_AMBULATORY_CARE_PROVIDER_SITE_OTHER): Payer: Medicare Other | Admitting: Internal Medicine

## 2019-03-09 VITALS — BP 148/78 | Ht 67.0 in | Wt 263.0 lb

## 2019-03-09 DIAGNOSIS — Z89512 Acquired absence of left leg below knee: Secondary | ICD-10-CM | POA: Diagnosis not present

## 2019-03-09 DIAGNOSIS — L03116 Cellulitis of left lower limb: Secondary | ICD-10-CM | POA: Insufficient documentation

## 2019-03-09 MED ORDER — DOXYCYCLINE HYCLATE 100 MG PO TABS: 100.0000 mg | ORAL_TABLET | Freq: Two times a day (BID) | ORAL | 0 refills | Status: DC

## 2019-03-09 MED ORDER — CEPHALEXIN 500 MG PO CAPS: 500.0000 mg | ORAL_CAPSULE | Freq: Four times a day (QID) | ORAL | 0 refills | Status: AC

## 2019-03-09 NOTE — Progress Notes (Signed)
CC:  Left stump pain  HPI:  [name] is a 58 y.o. female with history noted below that presents to the acute care clinic for left stump pain. Please see problem based charting for the status of patient's acute medical conditions.  Past Medical History:  Diagnosis Date  . Anginal pain [facility])    '3' of 10 ischemia ruled out 9/9   . Arthritis of lumbar spine   . Asthma   . CHF (congestive heart failure) ([place])   . 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 Chronic bronchitis ([place])    "I get it alot" (09/28/2013)  . Chronic diastolic heart failure (HCC)    grade 2 per 2D echocardiogram (01/2013)  . Chronic lower back pain   . Chronic osteomyelitis of foot (HCC)    chronic, right secondary to diabetic foot ulcers  . Chronic pain syndrome 12/03/2011   Likely secondary to depression, "fibromyalgia", neuropathy, and obesity. Lumbar MRI 2014 no sig change from prior (2008) : Stable hypertrophic facet disease most notable at L4-5. Stable shallow left foraminal/extraforaminal disc protrusion at L4-5. No direct neural compression.      Marland Kitchen COPD 01/08/2007   PFT's 05/2007 : FEV1/FVC 82, FEV1 64% pred, FEF 25-75% 40% predicted, 16% improvement in FEV1 with bronchodilators.     . Depression   . Diabetic peripheral neuropathy ([place])   . DVT of upper extremity (deep vein thrombosis) ([place]) 03/11/2013   Secondary to PICC line. Right brachial vein, diagnosed on 03/10/2013 Coumadin for 3 months. End date 06/10/2013   . Environmental allergies    Hx: of  . Exertional shortness of breath   . Fatty liver 2003   observed on ultrasound abdomen  . Fibromyalgia   . GERD (gastroesophageal reflux disease)   . Glaucoma   . Hyperlipidemia   . Hyperplastic colon polyp 12/2010   Per colonoscopy (12/2010) - Dr. Deatra Ina  . Hypertension   . 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 Blood cultures 01/26/13 S. Aureus and GBS. Blood cultures Feb 6th,  8th, and 9th and March were negative.Repeat TEE 3/20 negative for vegitations  . 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 2/2 tobacco use, DM, and cont weight bearing on surgical wound and developed gangrene   . Polymicrobial bacterial infection 01/2013   GBS and S. aureus bacteremia // Source likely infected diabetic foot ulcer  . PVD (peripheral vascular disease) with claudication ([place])    Stents to bilateral common iliac arteries (left 2005, right 2008), on chronic plavix  . Rheumatoid arthritis ([place])   . Tobacco abuse   . Type II diabetes mellitus with peripheral circulatory disorders, uncontrolled DX: 1993   Insulin dep. Poor control. Complicated by diabetic foot ulcer and diabetic eye disease.    Marland Kitchen Ulcer of foot, chronic (HCC)    Left. No OM per MRI (01/2013)    Review of Systems:  Review of Systems  Constitutional: Negative for chills and fever.  Gastrointestinal: Negative for nausea and vomiting.     Physical Exam:  Vitals:   03/09/19 1317 03/09/19 1319  BP:  (!) 148/78  Weight: 263 lb (119.3 kg)   Height: 5\' 7"  (1.702 m)    Physical Exam  Constitutional: She is well-developed, well-nourished, and in no distress.  Cardiovascular: Normal rate, regular rhythm and normal heart sounds. Exam reveals no gallop and no friction  rub.  No murmur heard. Pulmonary/Chest: Effort normal and breath sounds normal. No respiratory distress. She has no wheezes. She has no rales.        Skin:  Erythema to left stump up to middle thigh with peeling of skin No open wounds or purulent drainage noted     Assessment & Plan:   See encounters tab for problem based medical decision making.   Patient discussed with Dr. Daryll Drown

## 2019-03-09 NOTE — Assessment & Plan Note (Signed)
History of present illness:  Patient reports a 2 day history of increasing warmth and pain in her left stump.  She denies fever/chills or nausea and vomiting.  Patient states she cannot afford antibiotics.   Assessment:  Left extremity cellulitis History of uncontrolled diabetes.  Appearance concerning for staph infection with the desquamation of skin.  Will treat for both staph and strep.  No fevers.  Pharmacy was called for the price of antibiotics. Both antibiotics were given a cost total of $8. Patient states she has $5 and will need help with the rest.  Plan -Keflex and doxycycline for 1 week -Area was marked and patient was asked to call the clinic on Friday for update on status of erythema and pain - $3 given

## 2019-03-09 NOTE — Patient Instructions (Signed)
Ms. Yebra,  It is a pleasure meeting you today.  Please start taking her antibiotics called Keflex and doxycycline every day for 7 days The redness on her leg has been outlined. If the redness spreads beyond the marker He will need to come back into the clinic. Please call by Friday to update [place] on the status of your pain and redness of your left leg

## 2019-03-09 NOTE — Progress Notes (Signed)
Internal Medicine Clinic Attending  Case discussed with Dr. Hoffman at the time of the visit.  We reviewed the resident's history and exam and pertinent patient test results.  I agree with the assessment, diagnosis, and plan of care documented in the resident's note.  

## 2019-03-10 ENCOUNTER — Ambulatory Visit: Payer: Self-pay | Admitting: Dietician

## 2019-03-10 ENCOUNTER — Other Ambulatory Visit: Payer: Self-pay | Admitting: Internal Medicine

## 2019-03-10 DIAGNOSIS — Z89519 Acquired absence of unspecified leg below knee: Secondary | ICD-10-CM

## 2019-03-10 NOTE — Telephone Encounter (Signed)
Received VM message from Jackelyn Poling that the process for power w/c will need to be restarted. She is requesting an order to "Assess patient for possible power w/c" be faxed to her at [phone]. Once she receives this she will contact patient to get the process started. [name], RN, BSN

## 2019-03-10 NOTE — Telephone Encounter (Signed)
Put in DME other order

## 2019-03-10 NOTE — Telephone Encounter (Addendum)
Order to assess for power wheelchair faxed to Andria Rhein at [facility] at [phone]. Confirmation fax received. Debbie made aware that this has been done. [name], RN, BSN

## 2019-03-11 ENCOUNTER — Ambulatory Visit (INDEPENDENT_AMBULATORY_CARE_PROVIDER_SITE_OTHER): Payer: Medicare Other | Admitting: Internal Medicine

## 2019-03-11 ENCOUNTER — Other Ambulatory Visit: Payer: Self-pay

## 2019-03-11 ENCOUNTER — Other Ambulatory Visit: Payer: Self-pay | Admitting: *Deleted

## 2019-03-11 ENCOUNTER — Encounter: Payer: Self-pay | Admitting: Internal Medicine

## 2019-03-11 DIAGNOSIS — L03116 Cellulitis of left lower limb: Secondary | ICD-10-CM | POA: Diagnosis not present

## 2019-03-11 DIAGNOSIS — E119 Type 2 diabetes mellitus without complications: Secondary | ICD-10-CM | POA: Diagnosis not present

## 2019-03-11 DIAGNOSIS — Z792 Long term (current) use of antibiotics: Secondary | ICD-10-CM | POA: Diagnosis not present

## 2019-03-11 DIAGNOSIS — Z89512 Acquired absence of left leg below knee: Secondary | ICD-10-CM | POA: Diagnosis not present

## 2019-03-11 NOTE — Patient Instructions (Signed)
[name],   It was a pleasure taking care of you at the clinic today.  From my overall assessment of your leg infection, the antibiotics that was prescribed to you 2 days ago seems to be controlling the infection and has not spread around.  Here are my recommendations after today's visit:  1.  I want you to continue the antibiotics to finish the 7-day course 2.  After you finish the antibiotics, I want you to call the clinic and based on your improvement we will either stop the antibiotics or continue for an additional 7 days.  Take care!  Dr. Eileen Stanford  Please call the internal medicine center clinic if you have any questions or concerns, we may be able to help and keep you from a long and expensive emergency room wait. Our clinic and after hours phone number is [phone], the best time to call is Monday through Friday 9 am to 4 pm but there is always someone available 24/7 if you have an emergency. If you need medication refills please notify your pharmacy one week in advance and they will send [place] a request.

## 2019-03-11 NOTE — Progress Notes (Signed)
CC: Follow-up left lower extremity cellulitis  HPI:  [name] is a 58 y.o. with diabetes mellitus and left lower extremity cellulitis presenting for follow-up.  Please see problem based charting for further details.  Past Medical History:  Diagnosis Date  . Anginal pain [facility])    '3' of 10 ischemia ruled out 9/9   . Arthritis of lumbar spine   . Asthma   . CHF (congestive heart failure) ([place])   . 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 Chronic bronchitis ([place])    "I get it alot" (09/28/2013)  . Chronic diastolic heart failure (HCC)    grade 2 per 2D echocardiogram (01/2013)  . Chronic lower back pain   . Chronic osteomyelitis of foot (HCC)    chronic, right secondary to diabetic foot ulcers  . Chronic pain syndrome 12/03/2011   Likely secondary to depression, "fibromyalgia", neuropathy, and obesity. Lumbar MRI 2014 no sig change from prior (2008) : Stable hypertrophic facet disease most notable at L4-5. Stable shallow left foraminal/extraforaminal disc protrusion at L4-5. No direct neural compression.      Marland Kitchen COPD 01/08/2007   PFT's 05/2007 : FEV1/FVC 82, FEV1 64% pred, FEF 25-75% 40% predicted, 16% improvement in FEV1 with bronchodilators.     . Depression   . Diabetic peripheral neuropathy ([place])   . DVT of upper extremity (deep vein thrombosis) ([place]) 03/11/2013   Secondary to PICC line. Right brachial vein, diagnosed on 03/10/2013 Coumadin for 3 months. End date 06/10/2013   . Environmental allergies    Hx: of  . Exertional shortness of breath   . Fatty liver 2003   observed on ultrasound abdomen  . Fibromyalgia   . GERD (gastroesophageal reflux disease)   . Glaucoma   . Hyperlipidemia   . Hyperplastic colon polyp 12/2010   Per colonoscopy (12/2010) - Dr. Deatra Ina  . Hypertension   . 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 Blood cultures 01/26/13 S. Aureus and GBS. Blood cultures Feb 6th, 8th, and  9th and March were negative.Repeat TEE 3/20 negative for vegitations  . 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 2/2 tobacco use, DM, and cont weight bearing on surgical wound and developed gangrene   . Polymicrobial bacterial infection 01/2013   GBS and S. aureus bacteremia // Source likely infected diabetic foot ulcer  . PVD (peripheral vascular disease) with claudication ([place])    Stents to bilateral common iliac arteries (left 2005, right 2008), on chronic plavix  . Rheumatoid arthritis ([place])   . Tobacco abuse   . Type II diabetes mellitus with peripheral circulatory disorders, uncontrolled DX: 1993   Insulin dep. Poor control. Complicated by diabetic foot ulcer and diabetic eye disease.    Marland Kitchen Ulcer of foot, chronic (HCC)    Left. No OM per MRI (01/2013)   Review of Systems: As per HPI  Physical Exam:  Vitals:   03/11/19 1016  BP: (!) 157/45  Pulse: 80  Temp: 98.1 F (36.7 C)  TempSrc: Oral  SpO2: 97%  Height: 5\' 7"  (1.702 m)   Physical Exam Vitals signs and nursing note reviewed.  Constitutional:      Appearance: She is obese.  HENT:     Head: Normocephalic and atraumatic.  Cardiovascular:     Rate and Rhythm: Normal rate.     Heart sounds: Normal heart sounds.  Pulmonary:  Effort: Pulmonary effort is normal.     Breath sounds: Normal breath sounds. No wheezing, rhonchi or rales.  Musculoskeletal:        General: No tenderness.     Comments: Left lower extremity stump nontender to palpation.  Skin:    General: Skin is warm.     Findings: Erythema present.          Assessment & Plan:   See Encounters Tab for problem based charting.  Patient discussed with Dr. Angelia Mould

## 2019-03-11 NOTE — Progress Notes (Signed)
Internal Medicine Clinic Attending  Case discussed with Dr. Agyei at the time of the visit.  We reviewed the resident's history and exam and pertinent patient test results.  I agree with the assessment, diagnosis, and plan of care documented in the resident's note.    

## 2019-03-11 NOTE — Assessment & Plan Note (Signed)
Left lower extremity cellulitis: Jennifer [name] has diabetes mellitus and is status post left BKA in 2014.  She presented to the clinic on March 09, 2019 with complaints of left stump pain and was found to have cellulitis for which she was started on a 7-day course Keflex and doxycycline.  She was subsequently asked to follow-up at clinic today.  She did report improvement to her left stump pain and continued to deny fevers, chills or any signs of systemic infection.  She does report compliance to antibiotics and also her diabetes medications.  On physical exams, left lower extremity was erythematous however the area of erythema has not spread beyond the line of demarcation.   Plan: -Advised patient to continue Keflex and doxycycline for 7 days.  After completion of antibiotics, she is to call the clinic to reassess in order to make medical decision on whether to extend her antibiotic treatment to complete a 14-day course or to discontinue treatment.

## 2019-03-15 ENCOUNTER — Telehealth: Payer: Self-pay | Admitting: Licensed Clinical Social Worker

## 2019-03-15 NOTE — Telephone Encounter (Signed)
Patient was contacted to remind her of her appointment tomorrow, and to inform her this visit would be conducted via telehealth. Patient was provided with the office number in case she needs to reschedule or cancel this appointment.

## 2019-03-16 ENCOUNTER — Telehealth: Payer: Self-pay | Admitting: Licensed Clinical Social Worker

## 2019-03-16 ENCOUNTER — Ambulatory Visit: Payer: Self-pay | Admitting: Licensed Clinical Social Worker

## 2019-03-16 ENCOUNTER — Ambulatory Visit: Payer: Self-pay | Admitting: Pharmacist

## 2019-03-16 NOTE — Telephone Encounter (Signed)
Patient had a scheduled telephone appointment today. Patient was contacted at two different times in order to complete this appointment. Patient did not answer either phone call, and a voicemail was left for the patient to contact our office if she would like to reschedule this appointment.

## 2019-03-18 ENCOUNTER — Telehealth: Payer: Self-pay | Admitting: *Deleted

## 2019-03-18 NOTE — Telephone Encounter (Signed)
Received fax from [facility] with the following statement:   "Patient reports  OZEMPIC has been d/c and replaced with VICTOZA.  If so please confirm via fax and provide DOSAGE, INSTRUCTIONS, QUANTITY, and REFILLS for VICTOZA, or submit electronic prescription for Victoza."  Will send to pcp to confirm correct dose.Jennifer Hidden Cassady3/27/20203:09 PM

## 2019-03-18 NOTE — Telephone Encounter (Signed)
ozempic was stopped and victoza started. Pt had side effects to victoa and stopped it My last note indicated I was starting a different GLP 1 based on her insurance but I didn't document who I talked to or what was started. Sending to [name] for insurance assistance.

## 2019-03-22 ENCOUNTER — Other Ambulatory Visit: Payer: Self-pay | Admitting: Internal Medicine

## 2019-03-23 ENCOUNTER — Telehealth (INDEPENDENT_AMBULATORY_CARE_PROVIDER_SITE_OTHER): Payer: Self-pay

## 2019-03-23 NOTE — Telephone Encounter (Signed)
I called and lm on vm to advise pt calling to ask prescreen COVID-19 questions. Pt has an appt with Dr. Sharol Given tomorrow to call back

## 2019-03-24 ENCOUNTER — Ambulatory Visit (INDEPENDENT_AMBULATORY_CARE_PROVIDER_SITE_OTHER): Payer: Medicare Other | Admitting: Orthopedic Surgery

## 2019-03-24 ENCOUNTER — Telehealth (INDEPENDENT_AMBULATORY_CARE_PROVIDER_SITE_OTHER): Payer: Self-pay | Admitting: Orthopedic Surgery

## 2019-03-24 NOTE — Telephone Encounter (Addendum)
Ozempic appears to be preferred by her insurance plan. Call placed to her to find out dosage, tolerance, blood sugars Call placed to Ms. Hilgeman who reports that her Blood sugarshave been high, that she took ozempic before victoza but it was not doing anything for her blood sugars so she tried victoza and that was the one that she had reactions to and stopped.  Regarding her infected stump: she saw [employer] recently and they told her there was  nothing they can do , leg too swollen and infected, so she made an appointment with dr duda Monday at 130 She also reports "I don't feel too hot", bad cough, her throat is so sore, no fever, no chills, agrees to an appointment. Her call was transferred to triage.

## 2019-03-24 NOTE — Telephone Encounter (Signed)
Patient called to r/s her appointment and I asked her all the prescreening questions.  Patient did answer "yes" to having a cough for the past 7 days, she states that she is a smoker, but has not been sick.

## 2019-03-24 NOTE — Telephone Encounter (Signed)
Pt states she continues to have a cough, productive, brownish/ yellow H/a Doesn't know if she has fevers Thrush of the mouth, 1 1/2 weeks has been on abx cbg states HI Stump is infected and swollen, draining "pus" and has a bad odor Can she come to clinic at 1000 4/3? Please advise, see donna's note.

## 2019-03-25 ENCOUNTER — Ambulatory Visit: Payer: Medicare Other | Admitting: Dietician

## 2019-03-25 ENCOUNTER — Encounter: Payer: Self-pay | Admitting: Internal Medicine

## 2019-03-25 ENCOUNTER — Ambulatory Visit (HOSPITAL_COMMUNITY)
Admission: RE | Admit: 2019-03-25 | Discharge: 2019-03-25 | Disposition: A | Discharge status: Home / Self Care | Payer: Medicare Other | Source: Ambulatory Visit | Attending: Family Medicine | Admitting: Family Medicine

## 2019-03-25 ENCOUNTER — Other Ambulatory Visit: Payer: Self-pay

## 2019-03-25 ENCOUNTER — Telehealth: Payer: Self-pay | Admitting: Pharmacist

## 2019-03-25 ENCOUNTER — Ambulatory Visit (INDEPENDENT_AMBULATORY_CARE_PROVIDER_SITE_OTHER): Payer: Medicare Other | Admitting: Internal Medicine

## 2019-03-25 ENCOUNTER — Telehealth: Payer: Self-pay | Admitting: Dietician

## 2019-03-25 VITALS — BP 142/59 | HR 65 | Temp 98.3°F | Ht 67.0 in | Wt 258.9 lb

## 2019-03-25 DIAGNOSIS — E1165 Type 2 diabetes mellitus with hyperglycemia: Secondary | ICD-10-CM

## 2019-03-25 DIAGNOSIS — R079 Chest pain, unspecified: Secondary | ICD-10-CM | POA: Diagnosis not present

## 2019-03-25 DIAGNOSIS — J42 Unspecified chronic bronchitis: Secondary | ICD-10-CM

## 2019-03-25 DIAGNOSIS — E1151 Type 2 diabetes mellitus with diabetic peripheral angiopathy without gangrene: Secondary | ICD-10-CM

## 2019-03-25 DIAGNOSIS — B370 Candidal stomatitis: Secondary | ICD-10-CM

## 2019-03-25 DIAGNOSIS — IMO0002 Reserved for concepts with insufficient information to code with codable children: Secondary | ICD-10-CM

## 2019-03-25 DIAGNOSIS — E118 Type 2 diabetes mellitus with unspecified complications: Secondary | ICD-10-CM

## 2019-03-25 DIAGNOSIS — B951 Streptococcus, group B, as the cause of diseases classified elsewhere: Secondary | ICD-10-CM | POA: Diagnosis not present

## 2019-03-25 DIAGNOSIS — L03119 Cellulitis of unspecified part of limb: Secondary | ICD-10-CM | POA: Diagnosis not present

## 2019-03-25 DIAGNOSIS — G4733 Obstructive sleep apnea (adult) (pediatric): Secondary | ICD-10-CM | POA: Diagnosis not present

## 2019-03-25 LAB — TROPONIN I: Troponin I: <0.03 ng/mL (ref <0.03)

## 2019-03-25 LAB — BASIC METABOLIC PANEL
Anion gap: 10 (ref 5–15)
BUN: 15 mg/dL (ref 6–20)
CO2: 26 mmol/L (ref 22–32)
Calcium: 9.3 mg/dL (ref 8.9–10.3)
Chloride: 100 mmol/L (ref 98–111)
Creatinine, Ser: 0.99 mg/dL (ref 0.44–1.00)
GFR calc Af Amer: >60 mL/min (ref >60)
GFR calc non Af Amer: >60 mL/min (ref >60)
Glucose, Bld: 347 mg/dL — ABNORMAL HIGH (ref 70–99)
Potassium: 4.5 mmol/L (ref 3.5–5.1)
Sodium: 136 mmol/L (ref 135–145)

## 2019-03-25 MED ORDER — FLUCONAZOLE 200 MG PO TABS: ORAL_TABLET | ORAL | 0 refills | Status: AC

## 2019-03-25 NOTE — Patient Instructions (Addendum)
Jennifer [name],  Start taking Ozempic 0.25 today, April 10th (next Friday), April 17 and April 24.  Then on May 1,  start taking 0.5 every Friday (continue on May 8,15 and 22) Continue taking 0.5 every Friday.  I will consult with Dr. Maudie Mercury about your question of starting at a higher dose and call you if she suggests and changes.

## 2019-03-25 NOTE — Telephone Encounter (Signed)
Yes, she needs in person appt acc

## 2019-03-25 NOTE — Progress Notes (Signed)
   Subjective:    [name], female    DOB: November 12, 1961, 58 y.o.   MRN: [id number]  HPI  Jennifer [name] is here for CP & thrush. Please see the A&P for the status of the pt's chronic medical problems.  ROS : per ROS section and in problem oriented charting. All other systems are negative.  PMHx, Soc hx, and / or Fam hx : she and her two sons are living with her niece.  Review of Systems  Constitutional: Positive for appetite change. Negative for fever.  HENT: Positive for mouth sores and voice change.   Respiratory: Positive for cough and shortness of breath.   Cardiovascular: Positive for chest pain.  Skin: Positive for wound.       Objective:   Physical Exam Constitutional:      General: She is not in acute distress.    Appearance: She is well-developed. She is obese. She is ill-appearing. She is not toxic-appearing or diaphoretic.  HENT:     Head: Normocephalic and atraumatic.     Right Ear: External ear normal.     Left Ear: External ear normal.     Nose: Nose normal. No congestion or rhinorrhea.     Mouth/Throat:     Mouth: Mucous membranes are moist.     Comments: Thrush on tongue and palate. Eyes:     Extraocular Movements: Extraocular movements intact.     Conjunctiva/sclera: Conjunctivae normal.  Cardiovascular:     Rate and Rhythm: Normal rate and regular rhythm.     Heart sounds: No murmur.  Pulmonary:     Effort: Pulmonary effort is normal. No respiratory distress.     Breath sounds: Normal breath sounds. No wheezing.  Skin:    General: Skin is warm and dry.  Neurological:     General: No focal deficit present.     Mental Status: She is alert. Mental status is at baseline.  Psychiatric:        Mood and Affect: Mood normal.        Behavior: Behavior normal.        Thought Content: Thought content normal.        Judgment: Judgment normal.           Assessment & Plan:

## 2019-03-25 NOTE — Telephone Encounter (Signed)
She got a yr supply of pregabalin in Nov

## 2019-03-25 NOTE — Assessment & Plan Note (Signed)
This problem is chronic and uncontrolled.  See my note from March 12.  I had stopped semaglutide due to the retinopathy risk but this appears to be the only GLP-1 her insurance will cover.  She has had no retinopathy of her eye exam.  Therefore, I think that benefits outweigh the risk and we are resuming semaglutide.  PLAN : Work with [name] diabetes educator to resume semaglutide

## 2019-03-25 NOTE — Assessment & Plan Note (Signed)
This problem is new.  She states that she has had for 1 to 2 weeks.  Anytime she drinks anything other than water, she gets a burning sensation on her tongue.  She states that she is losing her voice.  She states she had stress about a year ago and was treated with swish and swallow nystatin.  She has candidiasis but I am also concerned about esophageal candidiasis due to her chest pain.  Therefore I am going to use Diflucan 400 mg loading dose today followed by 14 days of 200 mg daily.  We are also going to work to get her diabetes under better control.  PLAN : Oral Diflucan for 14 days

## 2019-03-25 NOTE — Telephone Encounter (Signed)
Left message for patient to advise her of suggested starting dose of semilgutide/Ozempic of 0.5 mg/week per our pharmacist.

## 2019-03-25 NOTE — Progress Notes (Addendum)
Diabetes Self Management Training Start time:10:45  End time: 11:00 Discussed care plan with Dr. [name]. She would like to restart Jennifer [name] on ozempic weekly to lower blood sugars. Explained this to Jennifer [name]. She verbalized understanding of how much ozempic to take and how often. She request a prescription be sent to her mail order pharmacy. Asked her to write her blood sugar down so that if we have a phone visit her blood sugars will be readily available.  A sample ozempic pen and sample pen needles were provided to [name] today.  Plan: Will follow up in 2 weeks.  [name], RD 03/25/2019 11:50 AM.

## 2019-03-25 NOTE — Assessment & Plan Note (Signed)
This problem is new.  The duration of this problem is uncertain as she initially states he has had it for about a 1 week but then stated it started right after she last saw me which was March 12.  The chest pain is substernal and is a pressure sensation like somebody is standing on her chest.  It does not radiate anywhere and is associated with dyspnea.  Moving, coughing, and swallowing trigger the pain.  It will wake her up from sleep.  It lasts a few minutes and subsides spontaneously.  She gets about 3-4 episodes every hour.  Her dyspnea is worse than baseline but her cough is about the same.  She has known vascular disease and ACS has been ruled out before considering other diagnoses.  Her EKG which I personally reviewed showed a sinus rhythm, left axis deviation., biphasic T waves in lead II, no ischemic changes.  There is no change from her January EKG.  My top 2 diagnosis on the differential were ACS and esophageal candidiasis.  ACS is unlikely with her EKG but her troponin is pending.  I do not feel she needs to stay in the clinic until the troponin is resulted.  Esophageal candidiasis is the most likely diagnosis.  PLAN : Check troponin results Treat esophageal candidiasis Return to clinic if worsen

## 2019-03-28 ENCOUNTER — Encounter (INDEPENDENT_AMBULATORY_CARE_PROVIDER_SITE_OTHER): Payer: Self-pay | Admitting: Orthopedic Surgery

## 2019-03-28 ENCOUNTER — Encounter: Payer: Self-pay | Admitting: Dietician

## 2019-03-28 ENCOUNTER — Ambulatory Visit (INDEPENDENT_AMBULATORY_CARE_PROVIDER_SITE_OTHER): Payer: Medicare Other | Admitting: Orthopedic Surgery

## 2019-03-28 ENCOUNTER — Other Ambulatory Visit: Payer: Self-pay

## 2019-03-28 VITALS — Ht 67.0 in | Wt 258.9 lb

## 2019-03-28 DIAGNOSIS — T879 Unspecified complications of amputation stump: Secondary | ICD-10-CM

## 2019-03-29 ENCOUNTER — Encounter (INDEPENDENT_AMBULATORY_CARE_PROVIDER_SITE_OTHER): Payer: Self-pay | Admitting: Orthopedic Surgery

## 2019-03-29 ENCOUNTER — Telehealth: Payer: Self-pay | Admitting: Dietician

## 2019-03-29 ENCOUNTER — Telehealth: Payer: Self-pay | Admitting: Internal Medicine

## 2019-03-29 ENCOUNTER — Ambulatory Visit (INDEPENDENT_AMBULATORY_CARE_PROVIDER_SITE_OTHER): Payer: Self-pay | Admitting: Physician Assistant

## 2019-03-29 NOTE — Progress Notes (Signed)
Office Visit Note   Patient: Jennifer [name]           Date of Birth: 1961/06/09           MRN: [id number] Visit Date: 03/28/2019              Requested by: Bartholomew Crews, MD [address] [place], [place] [address] PCP: Bartholomew Crews, MD  Chief Complaint  Patient presents with  . Left Leg - Follow-up      HPI: Patient is a 58 year old woman who presents approximately 5-1/2 years status post left transtibial amputation.  Patient states she has been having a open wound odor and drainage over the past several weeks.  She denies any trauma she states she has been using the same prosthesis.  She states she is currently on antibiotics.  Patient has worked with Building surveyor.  Assessment & Plan: Visit Diagnoses:  1. Complication of amputation stump of left lower extremity ([place])     Plan: We will plan for revision left transtibial amputation on Friday.  Risk and benefits were discussed including nonhealing of the wound need for additional surgery.  Patient states she understands wished to proceed at this time.  Follow-Up Instructions: Return in about 2 weeks (around 04/11/2019).   Ortho Exam  Patient is alert, oriented, no adenopathy, well-dressed, normal affect, normal respiratory effort. Examination patient has foul-smelling odor from her transtibial amputation the soft tissue envelope is not viable there is a large open wound approximately 5 cm in diameter 1 cm deep.  There is no ascending cellulitis no purulent drainage.  Patient has poorly controlled diabetes with a hemoglobin A1c of 9.3.  She has had protein caloric malnutrition in the past as well.  Imaging: No results found. No images are attached to the encounter.  Labs: Lab Results  Component Value Date   HGBA1C 9.3 (A) 02/22/2019   HGBA1C 8.2 (A) 10/21/2018   HGBA1C 8.8 04/08/2018   ESRSEDRATE 84 (H) 01/27/2013   ESRSEDRATE 65 (H) 01/26/2013   ESRSEDRATE 15 08/13/2009   CRP 31.2 (H) 01/27/2013   REPTSTATUS 01/20/2017 FINAL 01/15/2017   GRAMSTAIN  01/15/2017    RARE WBC PRESENT,BOTH PMN AND MONONUCLEAR NO ORGANISMS SEEN    CULT NORMAL SKIN FLORA NO ANAEROBES ISOLATED  01/15/2017   LABORGA STAPHYLOCOCCUS AUREUS 01/26/2013   LABORGA GROUP B STREP(S.AGALACTIAE)ISOLATED 01/26/2013     Lab Results  Component Value Date   ALBUMIN 3.1 (L) 01/14/2017   ALBUMIN 2.9 (L) 01/08/2017   ALBUMIN 3.4 (L) 01/07/2017    Body mass index is 40.55 kg/m.  Orders:  No orders of the defined types were placed in this encounter.  No orders of the defined types were placed in this encounter.    Procedures: No procedures performed  Clinical Data: No additional findings.  ROS:  All other systems negative, except as noted in the HPI. Review of Systems  Objective: Vital Signs: Ht 5\' 7"  (1.702 m)   Wt 258 lb 14.4 oz (117.4 kg)   BMI 40.55 kg/m   Specialty Comments:  No specialty comments available.  PMFS History: Patient Active Problem List   Diagnosis Date Noted  . Oropharyngeal candidiasis 03/25/2019  . Cellulitis of left lower extremity 03/09/2019  . Vulvar cysts 01/27/2019  . Urinary incontinence 05/13/2018  . OSA (obstructive sleep apnea) 06/12/2017  . Morbid obesity with BMI of 40.0-44.9, adult ([place]) 03/26/2017  . Toe amputation status, right 01/16/2017  . Thrombocytopenia ([place]) 06/05/2016  . 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  Type 2 diabetes with nephropathy ([place]) 09/07/2015  . Uncontrolled diabetes mellitus with retinopathy, due to underlying condition, without macular edema ([place]) 09/05/2015  . Diabetic retinopathy ([place]) 09/05/2015  . Counseling regarding end of life decision making 06/14/2015  . Atherosclerosis of aorta ([place]) 04/04/2015  . Anemia 10/05/2014  . Chest pain 09/16/2013  . Chronic diastolic heart failure ([place])   . S/P BKA (below knee amputation) unilateral ([place])   . Tobacco abuse   . Severe obesity (BMI >= 40) ([place]) 03/02/2013  . Abnormality of gait  03/01/2013  . Healthcare maintenance 07/10/2012  . Chronic prescription opiate use 12/03/2011  . Secondary diabetes mellitus with peripheral vascular disease ([place]) 08/27/2011  . 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 ([place]) 11/29/2009  . Hypertension associated with diabetes ([place]) 11/29/2009  . Chronic insomnia 10/25/2009  . GASTROESOPHAGEAL REFLUX DISEASE 11/24/2008  . Depression, major, severe recurrence ([place]) 04/06/2008  . DM (diabetes mellitus) type II uncontrolled, periph vascular disorder ([place]) 04/02/2007  . Hyperlipidemia associated with type 2 diabetes mellitus ([place]) 01/08/2007  . COPD (chronic obstructive pulmonary disease) ([place]) 01/08/2007   Past Medical History:  Diagnosis Date  . Anginal pain [facility])    '3' of 10 ischemia ruled out 9/9   . Arthritis of lumbar spine   . Asthma   . CHF (congestive heart failure) ([place])   . 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 Chronic bronchitis ([place])    "I get it alot" (09/28/2013)  . Chronic diastolic heart failure (HCC)    grade 2 per 2D echocardiogram (01/2013)  . Chronic lower back pain   . Chronic osteomyelitis of foot (HCC)    chronic, right secondary to diabetic foot ulcers  . Chronic pain syndrome 12/03/2011   Likely secondary to depression, "fibromyalgia", neuropathy, and obesity. Lumbar MRI 2014 no sig change from prior (2008) : Stable hypertrophic facet disease most notable at L4-5. Stable shallow left foraminal/extraforaminal disc protrusion at L4-5. No direct neural compression.      Marland Kitchen COPD 01/08/2007   PFT's 05/2007 : FEV1/FVC 82, FEV1 64% pred, FEF 25-75% 40% predicted, 16% improvement in FEV1 with bronchodilators.     . Depression   . Diabetic peripheral neuropathy ([place])   . DVT of upper extremity (deep vein thrombosis) ([place]) 03/11/2013   Secondary to PICC line. Right brachial vein, diagnosed on 03/10/2013 Coumadin for 3 months. End date 06/10/2013   . Environmental allergies    Hx: of  . Exertional shortness  of breath   . Fatty liver 2003   observed on ultrasound abdomen  . Fibromyalgia   . GERD (gastroesophageal reflux disease)   . Glaucoma   . Hyperlipidemia   . Hyperplastic colon polyp 12/2010   Per colonoscopy (12/2010) - Dr. Deatra Ina  . Hypertension   . 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 Blood cultures 01/26/13 S. Aureus and GBS. Blood cultures Feb 6th, 8th, and 9th and March were negative.Repeat TEE 3/20 negative for vegitations  . 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 2/2 tobacco use, DM, and cont weight bearing on surgical wound and developed gangrene   . Polymicrobial bacterial infection 01/2013   GBS and S. aureus bacteremia // Source likely infected diabetic foot ulcer  . PVD (peripheral vascular disease) with claudication ([place])    Stents to bilateral common iliac arteries (left 2005, right 2008), on chronic plavix  .  Rheumatoid arthritis ([place])   . Tobacco abuse   . Type II diabetes mellitus with peripheral circulatory disorders, uncontrolled DX: 1993   Insulin dep. Poor control. Complicated by diabetic foot ulcer and diabetic eye disease.    Marland Kitchen Ulcer of foot, chronic (HCC)    Left. No OM per MRI (01/2013)    Family History  Problem Relation Age of Onset  . Diverticulosis Mother   . Diabetes Mother   . Hypertension Mother   . Congestive Heart Failure Mother   . Asthma Father   . CAD Sister 43       MI at age 60 per patient.  However, she has not had a stent or CABG.   . Heart disease Sister        before age 34  . Breast cancer Neg Hx     Past Surgical History:  Procedure Laterality Date  . ABDOMINAL HYSTERECTOMY  1997   secondary to uterine fibroids  . AMPUTATION Left 08/31/2013   Procedure: AMPUTATION RAY;  Surgeon: Newt Minion, MD;  Location: [facility];  Service: Orthopedics;  Laterality: Left;  Left Foot 5th Ray Amputation  . AMPUTATION  Left 09/28/2013   Procedure: Left Midfoot amputation;  Surgeon: Newt Minion, MD;  Location: [facility];  Service: Orthopedics;  Laterality: Left;  Left Midfoot amputation  . AMPUTATION Left 10/14/2013   Procedure: AMPUTATION BELOW KNEE- left;  Surgeon: Newt Minion, MD;  Location: [facility];  Service: Orthopedics;  Laterality: Left;  Left Below Knee Amputation   . AMPUTATION TOE Right 01/15/2017   Procedure: AMPUTATION 5th TOE RIGHT FOOT;  Surgeon: Edrick Kins, DPM;  Location: [facility];  Service: Podiatry;  Laterality: Right;  . BLADDER SURGERY     bladder reconstruction surgery  . BREAST BIOPSY     multiple-benign per pt  . ESOPHAGOGASTRODUODENOSCOPY N/A 09/20/2013   Procedure: ESOPHAGOGASTRODUODENOSCOPY (EGD);  Surgeon: Jerene Bears, MD;  Location: [facility];  Service: Gastroenterology;  Laterality: N/A;  . FOOT AMPUTATION THROUGH METATARSAL Left 09/28/2013  . GANGLION CYST EXCISION     multiple  . PERIPHERAL VASCULAR INTERVENTION     stents in lower ext  . SHOULDER ARTHROSCOPY W/ ROTATOR CUFF REPAIR Bilateral   . SKIN SPLIT GRAFT Bilateral 05/13/2013   Procedure: Right and Left Foot Allograft Skin Graft;  Surgeon: Newt Minion, MD;  Location: [facility];  Service: Orthopedics;  Laterality: Bilateral;  Right and Left Foot Allograft Skin Graft  . TEE WITHOUT CARDIOVERSION N/A 01/31/2013   Procedure: TRANSESOPHAGEAL ECHOCARDIOGRAM (TEE);  Surgeon: Fay Records, MD;  Location: [facility];  Service: Cardiovascular;  Laterality: N/A;  Rm [phone]  . TEE WITHOUT CARDIOVERSION N/A 03/10/2013   Procedure: TRANSESOPHAGEAL ECHOCARDIOGRAM (TEE);  Surgeon: Larey Dresser, MD;  Location: [facility];  Service: Cardiovascular;  Laterality: N/A;  Rm. [phone]  . TOE AMPUTATION Left 08/31/2013   4TH & 5 TH TOE   . TONSILLECTOMY    . TUBAL LIGATION    . WRIST SURGERY Right    "for tumors" (09/28/2013)   Social History   Occupational History  . Occupation: Disability    Comment: previously worked as a Programmer, systems  . Smoking status: Current Every Day Smoker    Packs/day: 1.00    Years: 44.00    Pack years: 44.00    Types: Cigarettes  . Smokeless tobacco: Never Used  . Tobacco comment: 1PPD  Substance and Sexual Activity  . Alcohol use: No  Alcohol/week: 0.0 standard drinks  . Drug use: No    Types: Marijuana, "Crack" cocaine    Comment: 09/28/2013 "no marijuana since 2011, no crack/cocaine 1989"  . Sexual activity: Not Currently

## 2019-03-29 NOTE — Telephone Encounter (Signed)
Pt requesting a call back about her dosage on her   oxyCODONE-acetaminophen (PERCOCET/ROXICET) 5-325 MG tablet

## 2019-03-29 NOTE — Telephone Encounter (Signed)
She calls and ask if her oxycodone will be increased after surgery, she is advised that dr duda will more than likely provide pain med for 4 to 6 weeks but will send to dr Software engineer.she is agreeable and ask for prayer.

## 2019-03-29 NOTE — Telephone Encounter (Signed)
I am OK if she gets a sample delivered to her hospital room

## 2019-03-29 NOTE — Telephone Encounter (Signed)
Pt would like a call back as she has misplaced the sample of Ozempic that you had given her. Pt states she will need his medication because she is having surgery on 04/01/2019 and will need to have it to take.

## 2019-03-29 NOTE — Telephone Encounter (Signed)
Agree, if she needs more after surgery, Dr Sharol Given should Rx.

## 2019-03-29 NOTE — Telephone Encounter (Signed)
Spoke with Jennifer [name] who says she does not have semalgutide and cannot get semaglutide from her pharmacy until next month. She does not have anyone who can pick up a sample from our office. She says it would work best for her if we could bring the medicine sample to her hospital room. She thinks she will be here through the weekend.   She reports her blood sugars are in the 200s, I encouraged her to try to get them in the 100s before her surgery. She verbalized understanding to restart it at 0.25 mg per week x 4 weeks

## 2019-03-30 ENCOUNTER — Encounter (HOSPITAL_COMMUNITY): Payer: Self-pay | Admitting: *Deleted

## 2019-03-30 MED ORDER — UMECLIDINIUM-VILANTEROL 62.5-25 MCG/INH IN AEPB: 1.0000 | INHALATION_SPRAY | Freq: Every day | RESPIRATORY_TRACT | 11 refills | Status: DC

## 2019-03-30 MED ORDER — FLUTICASONE PROPIONATE 50 MCG/ACT NA SUSP: NASAL | 3 refills | Status: DC

## 2019-03-30 NOTE — Addendum Note (Signed)
Addended by: Forde Dandy on: 03/30/2019 01:25 PM   Modules accepted: Orders

## 2019-03-30 NOTE — Progress Notes (Addendum)
Jennifer [name] is a 58 y.o. female who was contacted for follow up after ICS de-escalation.  Patient's current COPD medication regimen consists of: Anoro and PRN albuterol. She does report increased use of albuterol to 2-3 times per day from 2-3 times per week. She states this could be due to allergies. She also reports continuing to smoke and has not made progress with smoking cessation due to Covid. She states she is motivated to try quitting, referred patient to [facility].  Allergies  Allergen Reactions  . Abilify [Aripiprazole] Other (See Comments)    Urinary freq Nov 2016  . Iohexol      Desc: IV CONTRAST CAUSE NEPHROPATHY IN 2007   . Ivp Dye [Iodinated Diagnostic Agents]   . Morphine Sulfate Itching and Rash   Medication Sig  albuterol (PROVENTIL HFA;VENTOLIN HFA) 108 (90 Base) MCG/ACT inhaler Inhale 1-2 puffs into the lungs every 4 (four) hours as needed for wheezing or shortness of breath.  albuterol (PROVENTIL) (2.5 MG/3ML) 0.083% nebulizer solution Take 3 mLs (2.5 mg total) by nebulization every 6 (six) hours as needed for wheezing.  amLODipine-benazepril (LOTREL) 10-40 MG capsule TAKE 1 CAPSULE BY MOUTH EVERY DAY  atenolol (TENORMIN) 100 MG tablet TAKE 1 TABLET BY MOUTH EVERY DAY Patient taking differently: Take 100 mg by mouth daily.   baclofen (LIORESAL) 10 MG tablet TAKE 1 TABLET BY MOUTH THREE TIMES A DAY AS NEEDED FOR MUSCLE SPASMS Patient taking differently: Take 10 mg by mouth 3 (three) times daily as needed for muscle spasms.   betamethasone dipropionate (DIPROLENE) 0.05 % cream Apply 1 application topically 2 (two) times daily as needed (irritation).  buPROPion (WELLBUTRIN XL) 300 MG 24 hr tablet TAKE 1 TABLET BY MOUTH DAILY Patient taking differently: Take 300 mg by mouth daily.   cholecalciferol (VITAMIN D) 1000 units tablet Take 1,000 Units by mouth daily.  doxycycline (VIBRA-TABS) 100 MG tablet Take 1 tablet (100 mg total) by mouth 2 (two) times daily. Patient not  taking: Reported on 03/29/2019  DULoxetine (CYMBALTA) 30 MG capsule TAKE 1 CAPSULE BY MOUTH EVERY DAY Patient taking differently: Take 30 mg by mouth daily.   EASY TOUCH PEN NEEDLES 31G X 8 MM MISC USE TO INJECT INSULIN TWICE DAILY  fesoterodine (TOVIAZ) 8 MG TB24 tablet Take 8 mg by mouth daily.   fluconazole (DIFLUCAN) 200 MG tablet Take 2 tablets (400 mg total) by mouth daily for 1 day, THEN 1 tablet (200 mg total) daily for 13 days.  fluticasone (FLONASE) 50 MCG/ACT nasal spray INSTILL 1 SPRAY IN EACH NOSTRIL DAILY Patient taking differently: Place 1 spray into both nostrils 2 (two) times daily.   furosemide (LASIX) 40 MG tablet TAKE ONE TABLET BY MOUTH DAILY AS NEEDED Patient taking differently: Take 40 mg by mouth daily as needed for fluid.   hydrochlorothiazide (HYDRODIURIL) 25 MG tablet TAKE 1 TABLET BY MOUTH ONCE DAILY Patient taking differently: Take 25 mg by mouth daily.   hydrOXYzine (ATARAX/VISTARIL) 25 MG tablet TAKE 1 TABLET (25 MG TOTAL) BY MOUTH 3 (THREE) TIMES DAILY AS NEEDED FOR ITCHING.  ibuprofen (ADVIL,MOTRIN) 800 MG tablet Take 1 tablet (800 mg total) by mouth 3 (three) times daily. Patient not taking: Reported on 03/29/2019  Insulin Degludec (TRESIBA FLEXTOUCH) 200 UNIT/ML SOPN Inject 66 Units into the skin daily. Patient taking differently: Inject 70 Units into the skin daily.   ipratropium (ATROVENT) 0.02 % nebulizer solution USE 1 VIAL VIA NEBULIZER EVERY 6 HOURS AS NEEDED FOR WHEEZING Patient taking differently: Take 0.5  mg by nebulization every 6 (six) hours as needed for wheezing.   metFORMIN (GLUCOPHAGE-XR) 500 MG 24 hr tablet TAKE 1 TABLET BY MOUTH EVERY DAY WITH BREAKFAST Patient taking differently: Take 500 mg by mouth daily with breakfast.   mirabegron ER (MYRBETRIQ) 50 MG TB24 tablet Take 1 tablet (50 mg total) by mouth daily.  Multiple Vitamins-Minerals (MULTIVITAMIN WITH MINERALS) tablet Take 1 tablet by mouth daily.  NYSTATIN powder APPLY TO AFFECTED AREA  THREE TIMES A DAY Patient taking differently: Apply 1 g topically 3 (three) times daily as needed (irritation).   omeprazole (PRILOSEC) 20 MG capsule Take 1 capsule (20 mg total) by mouth 2 (two) times daily before a meal.  ONETOUCH DELICA LANCETS 63W MISC USE TO CHECK BLOOD SUGARS FOUR TIMES A DAY  ONETOUCH VERIO test strip USE 3-4 TIMES DAILY TO CHECK BLOOD SUGAR  oxyCODONE-acetaminophen (PERCOCET/ROXICET) 5-325 MG tablet Take 1 tablet by mouth every 4 (four) hours as needed for severe pain.  pregabalin (LYRICA) 200 MG capsule TAKE 1 CAPSULE BY MOUTH THREE TIMES A DAY Patient taking differently: Take 200 mg by mouth 3 (three) times daily.   rosuvastatin (CRESTOR) 20 MG tablet TAKE 1 TABLET BY MOUTH AT BEDTIME Patient taking differently: Take 20 mg by mouth at bedtime.   traZODone (DESYREL) 100 MG tablet TAKE ONE TABLET BY MOUTH DAILY AT BEDTIME AS NEEDED FOR SLEEP Patient taking differently: Take 200 mg by mouth at bedtime as needed for sleep.   umeclidinium-vilanterol (ANORO ELLIPTA) 62.5-25 MCG/INH AEPB Inhale 1 puff into the lungs daily.  vitamin B-12 (CYANOCOBALAMIN) 100 MCG tablet Take 100 mcg by mouth daily.  vitamin C (ASCORBIC ACID) 500 MG tablet Take 500 mg by mouth daily.  Potassium Chloride ER 20 MEQ TBCR Take 40 mEq by mouth daily.   Past Medical History:  Diagnosis Date  . Anginal pain [facility])    '3' of 10 ischemia ruled out 9/9   . Arthritis of lumbar spine   . Asthma   . CHF (congestive heart failure) ([place])   . 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 Chronic bronchitis ([place])    "I get it alot" (09/28/2013)  . Chronic diastolic heart failure (HCC)    grade 2 per 2D echocardiogram (01/2013)  . Chronic lower back pain   . Chronic osteomyelitis of foot (HCC)    chronic, right secondary to diabetic foot ulcers  . Chronic pain syndrome 12/03/2011   Likely secondary to depression, "fibromyalgia", neuropathy, and obesity. Lumbar MRI 2014 no sig change from prior  (2008) : Stable hypertrophic facet disease most notable at L4-5. Stable shallow left foraminal/extraforaminal disc protrusion at L4-5. No direct neural compression.      Marland Kitchen COPD 01/08/2007   PFT's 05/2007 : FEV1/FVC 82, FEV1 64% pred, FEF 25-75% 40% predicted, 16% improvement in FEV1 with bronchodilators.     . Depression   . Diabetic peripheral neuropathy ([place])   . DVT of upper extremity (deep vein thrombosis) ([place]) 03/11/2013   Secondary to PICC line. Right brachial vein, diagnosed on 03/10/2013 Coumadin for 3 months. End date 06/10/2013   . Environmental allergies    Hx: of  . Exertional shortness of breath   . Fatty liver 2003   observed on ultrasound abdomen  . Fibromyalgia   . GERD (gastroesophageal reflux disease)   . Glaucoma   . Hyperlipidemia   . Hyperplastic colon polyp 12/2010   Per colonoscopy (12/2010) - Dr. Deatra Ina  . Hypertension   . 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 Blood cultures 01/26/13 S. Aureus and GBS. Blood cultures Feb 6th, 8th, and 9th and Mar 27, 2023 were negative.Repeat TEE 3/20 negative for vegitations  . 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 2/2 tobacco use, DM, and cont weight bearing on surgical wound and developed gangrene   . Polymicrobial bacterial infection 01/2013   GBS and S. aureus bacteremia // Source likely infected diabetic foot ulcer  . PVD (peripheral vascular disease) with claudication ([place])    Stents to bilateral common iliac arteries (left 03/26/2004, right 03/27/2007), on chronic plavix  . Rheumatoid arthritis ([place])   . Tobacco abuse   . Type II diabetes mellitus with peripheral circulatory disorders, uncontrolled DX: 1993   Insulin dep. Poor control. Complicated by diabetic foot ulcer and diabetic eye disease.    Marland Kitchen Ulcer of foot, chronic (HCC)    Left. No OM per MRI (01/2013)   Social History   Socioeconomic History  .  Marital status: Divorced    Spouse name: Not on file  . Number of children: 2  . Years of education: college  . Highest education level: Not on file  Occupational History  . Occupation: Disability    Comment: previously worked as a Civil engineer, contracting  . Financial resource strain: Very hard  . Food insecurity:    Worry: Often true    Inability: Often true  . Transportation needs:    Medical: No    Non-medical: Yes  Tobacco Use  . Smoking status: Current Every Day Smoker    Packs/day: 1.00    Years: 44.00    Pack years: 44.00    Types: Cigarettes  . Smokeless tobacco: Never Used  . Tobacco comment: 1PPD  Substance and Sexual Activity  . Alcohol use: No    Alcohol/week: 0.0 standard drinks  . Drug use: No    Types: Marijuana, "Crack" cocaine    Comment: 09/28/2013 "no marijuana since 03-26-10, no crack/cocaine 1989"  . Sexual activity: Not Currently  Lifestyle  . Physical activity:    Days per week: Not on file    Minutes per session: Not on file  . Stress: Not on file  Relationships  . Social connections:    Talks on phone: Not on file    Gets together: Not on file    Attends religious service: Not on file    Active member of club or organization: Not on file    Attends meetings of clubs or organizations: Not on file    Relationship status: Not on file  Other Topics Concern  . Not on file  Social History Narrative   On disability. Lives with son in [place]. Formerly worked as Training and development officer.    Boyfriend passed away stage 4 cancer 2013-03-26.   S/p L BKA 2013-03-26. In wheelchair in paritially suitable apartment.    Family History  Problem Relation Age of Onset  . Diverticulosis Mother   . Diabetes Mother   . Hypertension Mother   . Congestive Heart Failure Mother   . Asthma Father   . CAD Sister 55       MI at age 69 per patient.  However, she has not had a stent or CABG.   . Heart disease Sister        before age 38  . Breast cancer Neg Hx     O: Ht Readings from  Last  2 Encounters:  03/28/19 5\' 7"  (1.702 m)  03/25/19 5\' 7"  (1.702 m)   Wt Readings from Last 2 Encounters:  03/28/19 258 lb 14.4 oz (117.4 kg)  03/25/19 258 lb 14.4 oz (117.4 kg)   There is no height or weight on file to calculate BMI.  CAT ASSESSMENT  Rank each of the following items on a scale of 0 to 5 (with 5 being most severe) Write a # 0-5 in each box  I never cough (0) > I cough all the time (5) 3  I have no phlegm (mucus) in my chest (0) > My chest is completely full of phlegm (mucus) (5) 2  My chest does not feel tight at all (0) > My chest feels very tight (5) 0  When I walk up a hill or one flight of stairs I am not breathless (0) > When I walk up a hill or one flight of stairs I am very breathless (5) 4  I am not limited doing any activities at home (0) > I am very limited doing activities at home (5) 4  I am confident leaving my home despite my lung function (0) > I am not at all confident leaving my home because of my lung condition (5)  0  I sleep soundly (0) > I don't sleep soundly because of my lung condition (5) 5  I have lots of energy (0) > I have no energy at all (5) 5   Total CAT Score: 23  A/P: . Patient's symptoms have worsened slightly since last follow up call in January (CAT score increased from 14 to 23), however patient reports stopping Anoro due to thrush. She does state that the thrush is abating. Advised her to re-start Anoro once daily. Patient verbalized understanding. . Overall, since de-escalating ICS, CAT score improved from 30 to 14. No worsening of COPD control, no therapy changes recommended at this time. Refill sent to pharmacy for Anoro. . Counseled patient to report any changes in COPD medications, breathing, or shortness of breath.  . Patient did request help with allergies and smoking cessation. Sent a refill on fluticasone nasal spray and referral to [place] quitline. . Patient requested help with DM medications, provided patient with samples of  [name] (LOT [id number], exp 08/2020, qty 3) and Ozempic (LOT [id number], exp 04/2020, qty 2) and educated on use.  . Signing off of case, will notify PCP of findings.   The patient verbalized understanding of information provided by repeating back concepts discussed.

## 2019-03-31 ENCOUNTER — Encounter (HOSPITAL_COMMUNITY): Payer: Self-pay | Admitting: *Deleted

## 2019-03-31 ENCOUNTER — Other Ambulatory Visit: Payer: Self-pay

## 2019-03-31 ENCOUNTER — Ambulatory Visit (INDEPENDENT_AMBULATORY_CARE_PROVIDER_SITE_OTHER): Payer: Self-pay | Admitting: Physician Assistant

## 2019-03-31 NOTE — Progress Notes (Addendum)
Jennifer [name] denies chest pain, she does have a chronic cough "from smoking". Jennifer [name] has type II diabetes, she reports that her fasting CBG is greater than 200.  I instructed patient to check CBG in am and if it is greater than 70 to take 1/2 of scheduled Tresiba. I instructed patient to check CBG after awaking and every 2 hours until arrival  to the hospital.  I Instructed patient if CBG is less than 70 to drink 1/2 cup of a clear juice. Recheck CBG in 15 minutes then call pre- op desk at [phone] for further instructions. If scheduled to receive Insulin, do not take Insulin. Patient denies that she or her family has experienced any of the following: Cough chronic from smoking Fever >100.4 Runny Nose Sore Throat Difficulty breathing/ shortness of breath Travel in past 14 days- no

## 2019-03-31 NOTE — Progress Notes (Signed)
Anesthesia Chart Review: Jennifer [name]   Case:  [id number] Date/Time:  04/01/19 0910   Procedure:  REVISION LEFT BELOW KNEE AMPUTATION (Left )   Anesthesia type:  General   Pre-op diagnosis:  Dehiscence Left Below Knee Amputation   Location:  [facility] OR ROOM 03 / [place] [facility]   Surgeon:  Newt Minion, MD      DISCUSSION: Patient is a 58 year old female scheduled for the above procedure.  History includes smoking, DM2 with neuropathy, HTN, PVD (s/p bilateral CIA stents, L 2005, R 2008; left mid foot amputation-->BKA 2014; right 5th toe amputation 2018), COPD, glaucoma, infective endocarditis (MSSA and GBS, 01/2013, source: foot infection), asthma, CHF (could not find details well documented; grade 1 DD on 08/2013 echo), DVT (RUE secondary to PICC line, 2014), fatty liver, fibromyalgia, exertional dyspnea, RA, chest pain (2014, had negative stress test).  Last visit with PCP Bartholomew Crews, MD on 03/25/19 for chest pain and DM follow-up. She thought EKG was stable. Troponin < 0.03, so not felt to be ACS. Dr. [name] felt most likely diagnosis was esophageal candidiasis and started on Dilfucan. A1c 9.3, so recommendation to restart semaglutide and was given a sample. According to 03/29/19 notations in Epic, she misplaced her sample and touched base with her PCP about getting a sample delivered to her during her hospitalization. Dr. [name] is aware of surgery plans.  Patient is a same day work-up, so further evaluation by her anesthesia team on the day of surgery.   VS:  Wt Readings from Last 3 Encounters:  03/28/19 117.4 kg  03/25/19 117.4 kg  03/09/19 119.3 kg   Temp Readings from Last 3 Encounters:  03/25/19 36.8 C (Oral)  03/11/19 36.7 C (Oral)  03/03/19 36.8 C (Oral)   BP Readings from Last 3 Encounters:  03/25/19 (!) 142/59  03/11/19 (!) 157/45  03/09/19 (!) 148/78   Pulse Readings from Last 3 Encounters:  03/25/19 65  03/11/19 80  03/03/19 66    PROVIDERS: Bartholomew Crews,  MD is PCP ([place]) who is aware of surgery plans. Last visit 03/25/19.   LABS: She will need updated labs prior to surgery. Last labs include: Lab Results  Component Value Date   WBC 9.4 01/18/2019   HGB 13.4 01/18/2019   HCT 41.4 01/18/2019   PLT 163 01/18/2019   GLUCOSE 347 (H) 03/25/2019   NA 136 03/25/2019   K 4.5 03/25/2019   CL 100 03/25/2019   CREATININE 0.99 03/25/2019   BUN 15 03/25/2019   CO2 26 03/25/2019   HGBA1C 9.3 (A) 02/22/2019    IMAGES: CXR 01/18/19: IMPRESSION: No active cardiopulmonary disease.   EKG: 03/25/19 ([facility]): NSR. Inferior infarct (age undetermined). Cannot rule out anterior infarct (age undetermined).   CV: Nuclear stress test 09/19/13: IMPRESSION: Normal left ventricular function with quantitative ejection fraction of 65%. No evidence of inducible myocardial ischemia with Lexiscan administration.  Echo 09/17/13: Study Conclusions - Left ventricle: The cavity size was normal. There was severe concentric hypertrophy. Systolic function was vigorous. The estimated ejection fraction was in the range of 65% to 70%. Wall motion was normal; there were no regional wall motion abnormalities. Doppler parameters are consistent with abnormal left ventricular relaxation (grade 1 diastolic dysfunction). - Aortic valve: Valve area: 1.58cm^2(VTI). Valve area: 1.59cm^2 (Vmax). - Left atrium: The atrium was mildly dilated. - Atrial septum: No defect or patent foramen ovale was identified.  Carotid US 01/26/13: Summary: - Technically difficult study. -  Findings consistent with1-39% stenosis involving the right internal carotid artery and the left internal carotid artery. - Right ICA/ CCA ratio= 0.7. Left ICA / CCA ratio= 0.62. - Vertebral flow is antegrade.   Past Medical History:  Diagnosis Date  . Anginal pain [facility])    '3' of 10 ischemia ruled out 9/9   . Arthritis of lumbar spine   . Asthma    . CHF (congestive heart failure) ([place])   . 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 Chronic bronchitis ([place])    "I get it alot" (09/28/2013)  . Chronic diastolic heart failure (HCC)    grade 2 per 2D echocardiogram (01/2013)  . Chronic lower back pain   . Chronic osteomyelitis of foot (HCC)    chronic, right secondary to diabetic foot ulcers  . Chronic pain syndrome 12/03/2011   Likely secondary to depression, "fibromyalgia", neuropathy, and obesity. Lumbar MRI 2014 no sig change from prior (2008) : Stable hypertrophic facet disease most notable at L4-5. Stable shallow left foraminal/extraforaminal disc protrusion at L4-5. No direct neural compression.      Marland Kitchen COPD 01/08/2007   PFT's 05/2007 : FEV1/FVC 82, FEV1 64% pred, FEF 25-75% 40% predicted, 16% improvement in FEV1 with bronchodilators.     . Depression   . Diabetic peripheral neuropathy ([place])   . DVT of upper extremity (deep vein thrombosis) ([place]) 03/11/2013   Secondary to PICC line. Right brachial vein, diagnosed on 03/10/2013 Coumadin for 3 months. End date 06/10/2013   . Environmental allergies    Hx: of  . Exertional shortness of breath   . Fatty liver 2003   observed on ultrasound abdomen  . Fibromyalgia   . GERD (gastroesophageal reflux disease)   . Glaucoma   . Hyperlipidemia   . Hyperplastic colon polyp 12/2010   Per colonoscopy (12/2010) - Dr. Deatra Ina  . Hypertension   . 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 Blood cultures 01/26/13 S. Aureus and GBS. Blood cultures Feb 6th, 8th, and 9th and March were negative.Repeat TEE 3/20 negative for vegitations  . 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 2/2 tobacco use, DM, and cont weight bearing on surgical wound and developed gangrene   . Polymicrobial bacterial infection 01/2013   GBS and S. aureus bacteremia // Source likely infected diabetic  foot ulcer  . PVD (peripheral vascular disease) with claudication ([place])    Stents to bilateral common iliac arteries (left 2005, right 2008), on chronic plavix  . Rheumatoid arthritis ([place])   . Tobacco abuse   . Type II diabetes mellitus with peripheral circulatory disorders, uncontrolled DX: 1993   Insulin dep. Poor control. Complicated by diabetic foot ulcer and diabetic eye disease.    Marland Kitchen Ulcer of foot, chronic (HCC)    Left. No OM per MRI (01/2013)    Past Surgical History:  Procedure Laterality Date  . ABDOMINAL HYSTERECTOMY  1997   secondary to uterine fibroids  . AMPUTATION Left 08/31/2013   Procedure: AMPUTATION RAY;  Surgeon: Newt Minion, MD;  Location: [place];  Service: Orthopedics;  Laterality: Left;  Left Foot 5th Ray Amputation  . AMPUTATION Left 09/28/2013   Procedure: Left Midfoot amputation;  Surgeon: Newt Minion, MD;  Location: [place];  Service: Orthopedics;  Laterality: Left;  Left Midfoot amputation  . AMPUTATION Left 10/14/2013   Procedure: AMPUTATION BELOW KNEE- left;  Surgeon: Newt Minion, MD;  Location: [place];  Service: Orthopedics;  Laterality: Left;  Left Below Knee Amputation   . AMPUTATION TOE Right 01/15/2017   Procedure: AMPUTATION 5th TOE RIGHT FOOT;  Surgeon: Edrick Kins, DPM;  Location: [place];  Service: Podiatry;  Laterality: Right;  . BLADDER SURGERY     bladder reconstruction surgery  . BREAST BIOPSY     multiple-benign per pt  . ESOPHAGOGASTRODUODENOSCOPY N/A 09/20/2013   Procedure: ESOPHAGOGASTRODUODENOSCOPY (EGD);  Surgeon: Jerene Bears, MD;  Location: [place];  Service: Gastroenterology;  Laterality: N/A;  . FOOT AMPUTATION THROUGH METATARSAL Left 09/28/2013  . GANGLION CYST EXCISION     multiple  . PERIPHERAL VASCULAR INTERVENTION     stents in lower ext  . SHOULDER ARTHROSCOPY W/ ROTATOR CUFF REPAIR Bilateral   . SKIN SPLIT GRAFT Bilateral 05/13/2013   Procedure: Right and Left Foot Allograft Skin Graft;  Surgeon: Newt Minion, MD;   Location: [place];  Service: Orthopedics;  Laterality: Bilateral;  Right and Left Foot Allograft Skin Graft  . TEE WITHOUT CARDIOVERSION N/A 01/31/2013   Procedure: TRANSESOPHAGEAL ECHOCARDIOGRAM (TEE);  Surgeon: Fay Records, MD;  Location: [employer];  Service: Cardiovascular;  Laterality: N/A;  Rm [phone]  . TEE WITHOUT CARDIOVERSION N/A 03/10/2013   Procedure: TRANSESOPHAGEAL ECHOCARDIOGRAM (TEE);  Surgeon: Larey Dresser, MD;  Location: [place];  Service: Cardiovascular;  Laterality: N/A;  Rm. [phone]  . TOE AMPUTATION Left 08/31/2013   4TH & 5 TH TOE   . TONSILLECTOMY    . TUBAL LIGATION    . WRIST SURGERY Right    "for tumors" (09/28/2013)    MEDICATIONS: No current facility-administered medications for this encounter.    Marland Kitchen albuterol (PROVENTIL HFA;VENTOLIN HFA) 108 (90 Base) MCG/ACT inhaler  . albuterol (PROVENTIL) (2.5 MG/3ML) 0.083% nebulizer solution  . amLODipine-benazepril (LOTREL) 10-40 MG capsule  . atenolol (TENORMIN) 100 MG tablet  . baclofen (LIORESAL) 10 MG tablet  . betamethasone dipropionate (DIPROLENE) 0.05 % cream  . buPROPion (WELLBUTRIN XL) 300 MG 24 hr tablet  . cholecalciferol (VITAMIN D) 1000 units tablet  . DULoxetine (CYMBALTA) 30 MG capsule  . fesoterodine (TOVIAZ) 8 MG TB24 tablet  . fluconazole (DIFLUCAN) 200 MG tablet  . furosemide (LASIX) 40 MG tablet  . hydrochlorothiazide (HYDRODIURIL) 25 MG tablet  . hydrOXYzine (ATARAX/VISTARIL) 25 MG tablet  . Insulin Degludec (TRESIBA FLEXTOUCH) 200 UNIT/ML SOPN  . ipratropium (ATROVENT) 0.02 % nebulizer solution  . metFORMIN (GLUCOPHAGE-XR) 500 MG 24 hr tablet  . mirabegron ER (MYRBETRIQ) 50 MG TB24 tablet  . Multiple Vitamins-Minerals (MULTIVITAMIN WITH MINERALS) tablet  . NYSTATIN powder  . omeprazole (PRILOSEC) 20 MG capsule  . oxyCODONE-acetaminophen (PERCOCET/ROXICET) 5-325 MG tablet  . pregabalin (LYRICA) 200 MG capsule  . rosuvastatin (CRESTOR) 20 MG tablet  . traZODone (DESYREL) 100 MG tablet  .  vitamin B-12 (CYANOCOBALAMIN) 100 MCG tablet  . vitamin C (ASCORBIC ACID) 500 MG tablet  . doxycycline (VIBRA-TABS) 100 MG tablet  . EASY TOUCH PEN NEEDLES 31G X 8 MM MISC  . fluticasone (FLONASE) 50 MCG/ACT nasal spray  . ibuprofen (ADVIL,MOTRIN) 800 MG tablet  . ONETOUCH DELICA LANCETS 09G MISC  . ONETOUCH VERIO test strip  . umeclidinium-vilanterol (ANORO ELLIPTA) 62.5-25 MCG/INH AEPB    Myra Gianotti, PA-C Surgical Short Stay/Anesthesiology [facility] Phone [phone] [facility] Phone [phone] 03/31/2019 10:32 AM

## 2019-03-31 NOTE — Anesthesia Preprocedure Evaluation (Addendum)
Anesthesia Evaluation  Patient identified by MRN, date of birth, ID band Patient awake    Reviewed: Allergy & Precautions, NPO status , Patient's Chart, lab work & pertinent test results, reviewed documented beta blocker date and time   History of Anesthesia Complications Negative for: history of anesthetic complications  Airway Mallampati: I  TM Distance: >3 FB Neck ROM: Full    Dental  (+) Edentulous Upper, Edentulous Lower   Pulmonary shortness of breath, COPD,  COPD inhaler, Current Smoker,    breath sounds clear to auscultation       Cardiovascular hypertension, Pt. on medications and Pt. on home beta blockers (-) angina+ Peripheral Vascular Disease  + Valvular Problems/Murmurs (2014 endocarditis)  Rhythm:Regular Rate:Normal  '14 ECHO: EF 65-70%, valves OK   Neuro/Psych Depression negative neurological ROS     GI/Hepatic Neg liver ROS, GERD  Poorly Controlled,  Endo/Other  diabetes (glu 231), Insulin Dependent, Oral Hypoglycemic AgentsMorbid obesity  Renal/GU Renal InsufficiencyRenal disease     Musculoskeletal  (+) Arthritis , Fibromyalgia -  Abdominal (+) + obese,   Peds  Hematology negative hematology ROS (+)   Anesthesia Other Findings   Reproductive/Obstetrics                           Anesthesia Physical Anesthesia Plan  ASA: III  Anesthesia Plan: General   Post-op Pain Management:    Induction: Intravenous  PONV Risk Score and Plan: 3 and Dexamethasone and Ondansetron  Airway Management Planned: Oral ETT  Additional Equipment:   Intra-op Plan:   Post-operative Plan: Extubation in OR  Informed Consent: I have reviewed the patients History and Physical, chart, labs and discussed the procedure including the risks, benefits and alternatives for the proposed anesthesia with the patient or authorized representative who has indicated his/her understanding and acceptance.      Dental advisory given  Plan Discussed with: CRNA and Surgeon  Anesthesia Plan Comments: (PAT note written 03/31/2019 by Myra Gianotti, PA-C. Same day work-up. Plan routine monitors, GETA)       Anesthesia Quick Evaluation

## 2019-04-01 ENCOUNTER — Inpatient Hospital Stay (HOSPITAL_COMMUNITY): Payer: Medicare Other | Admitting: Vascular Surgery

## 2019-04-01 ENCOUNTER — Inpatient Hospital Stay (HOSPITAL_COMMUNITY)
Admission: RE | Admit: 2019-04-01 | Discharge: 2019-04-06 | DRG: 475 | Disposition: A | Discharge status: Skilled Nursing Facility | Payer: Medicare Other | Attending: Orthopedic Surgery | Admitting: Orthopedic Surgery

## 2019-04-01 ENCOUNTER — Encounter (HOSPITAL_COMMUNITY): Payer: Self-pay | Admitting: Certified Registered"

## 2019-04-01 ENCOUNTER — Encounter (HOSPITAL_COMMUNITY)
Admission: RE | Disposition: A | Discharge status: Skilled Nursing Facility | Payer: Self-pay | Source: Home / Self Care | Attending: Orthopedic Surgery

## 2019-04-01 DIAGNOSIS — M4696 Unspecified inflammatory spondylopathy, lumbar region: Secondary | ICD-10-CM | POA: Diagnosis not present

## 2019-04-01 DIAGNOSIS — E1140 Type 2 diabetes mellitus with diabetic neuropathy, unspecified: Secondary | ICD-10-CM | POA: Diagnosis not present

## 2019-04-01 DIAGNOSIS — G894 Chronic pain syndrome: Secondary | ICD-10-CM | POA: Diagnosis present

## 2019-04-01 DIAGNOSIS — M9931 Osseous stenosis of neural canal of cervical region: Secondary | ICD-10-CM | POA: Diagnosis not present

## 2019-04-01 DIAGNOSIS — G4733 Obstructive sleep apnea (adult) (pediatric): Secondary | ICD-10-CM | POA: Diagnosis not present

## 2019-04-01 DIAGNOSIS — Z89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): Secondary | ICD-10-CM

## 2019-04-01 DIAGNOSIS — K5900 Constipation, unspecified: Secondary | ICD-10-CM | POA: Diagnosis present

## 2019-04-01 DIAGNOSIS — M797 Fibromyalgia: Secondary | ICD-10-CM | POA: Diagnosis not present

## 2019-04-01 DIAGNOSIS — M069 Rheumatoid arthritis, unspecified: Secondary | ICD-10-CM | POA: Diagnosis present

## 2019-04-01 DIAGNOSIS — K760 Fatty (change of) liver, not elsewhere classified: Secondary | ICD-10-CM | POA: Diagnosis not present

## 2019-04-01 DIAGNOSIS — J449 Chronic obstructive pulmonary disease, unspecified: Secondary | ICD-10-CM | POA: Diagnosis present

## 2019-04-01 DIAGNOSIS — F332 Major depressive disorder, recurrent severe without psychotic features: Secondary | ICD-10-CM | POA: Diagnosis not present

## 2019-04-01 DIAGNOSIS — E119 Type 2 diabetes mellitus without complications: Secondary | ICD-10-CM | POA: Diagnosis not present

## 2019-04-01 DIAGNOSIS — Z4781 Encounter for orthopedic aftercare following surgical amputation: Secondary | ICD-10-CM | POA: Diagnosis not present

## 2019-04-01 DIAGNOSIS — K219 Gastro-esophageal reflux disease without esophagitis: Secondary | ICD-10-CM | POA: Diagnosis not present

## 2019-04-01 DIAGNOSIS — M2550 Pain in unspecified joint: Secondary | ICD-10-CM | POA: Diagnosis not present

## 2019-04-01 DIAGNOSIS — Z833 Family history of diabetes mellitus: Secondary | ICD-10-CM

## 2019-04-01 DIAGNOSIS — Z79899 Other long term (current) drug therapy: Secondary | ICD-10-CM

## 2019-04-01 DIAGNOSIS — E1165 Type 2 diabetes mellitus with hyperglycemia: Secondary | ICD-10-CM | POA: Diagnosis present

## 2019-04-01 DIAGNOSIS — E1142 Type 2 diabetes mellitus with diabetic polyneuropathy: Secondary | ICD-10-CM | POA: Diagnosis not present

## 2019-04-01 DIAGNOSIS — Z86718 Personal history of other venous thrombosis and embolism: Secondary | ICD-10-CM

## 2019-04-01 DIAGNOSIS — Z6841 Body Mass Index (BMI) 40.0 and over, adult: Secondary | ICD-10-CM

## 2019-04-01 DIAGNOSIS — R4781 Slurred speech: Secondary | ICD-10-CM | POA: Diagnosis present

## 2019-04-01 DIAGNOSIS — T8781 Dehiscence of amputation stump: Principal | ICD-10-CM

## 2019-04-01 DIAGNOSIS — F17210 Nicotine dependence, cigarettes, uncomplicated: Secondary | ICD-10-CM | POA: Diagnosis present

## 2019-04-01 DIAGNOSIS — E785 Hyperlipidemia, unspecified: Secondary | ICD-10-CM | POA: Diagnosis present

## 2019-04-01 DIAGNOSIS — Z7951 Long term (current) use of inhaled steroids: Secondary | ICD-10-CM | POA: Diagnosis not present

## 2019-04-01 DIAGNOSIS — Z89512 Acquired absence of left leg below knee: Secondary | ICD-10-CM

## 2019-04-01 DIAGNOSIS — R531 Weakness: Secondary | ICD-10-CM | POA: Diagnosis not present

## 2019-04-01 DIAGNOSIS — I110 Hypertensive heart disease with heart failure: Secondary | ICD-10-CM | POA: Diagnosis present

## 2019-04-01 DIAGNOSIS — I509 Heart failure, unspecified: Secondary | ICD-10-CM | POA: Diagnosis not present

## 2019-04-01 DIAGNOSIS — M62838 Other muscle spasm: Secondary | ICD-10-CM | POA: Diagnosis not present

## 2019-04-01 DIAGNOSIS — Z794 Long term (current) use of insulin: Secondary | ICD-10-CM | POA: Diagnosis not present

## 2019-04-01 DIAGNOSIS — G8929 Other chronic pain: Secondary | ICD-10-CM | POA: Diagnosis not present

## 2019-04-01 DIAGNOSIS — Z90710 Acquired absence of both cervix and uterus: Secondary | ICD-10-CM

## 2019-04-01 DIAGNOSIS — Z7401 Bed confinement status: Secondary | ICD-10-CM | POA: Diagnosis not present

## 2019-04-01 DIAGNOSIS — Y835 Amputation of limb(s) as the cause of abnormal reaction of the patient, or of later complication, without mention of misadventure at the time of the procedure: Secondary | ICD-10-CM | POA: Diagnosis present

## 2019-04-01 DIAGNOSIS — H409 Unspecified glaucoma: Secondary | ICD-10-CM | POA: Diagnosis present

## 2019-04-01 DIAGNOSIS — R5381 Other malaise: Secondary | ICD-10-CM | POA: Diagnosis not present

## 2019-04-01 DIAGNOSIS — I5030 Unspecified diastolic (congestive) heart failure: Secondary | ICD-10-CM | POA: Diagnosis not present

## 2019-04-01 DIAGNOSIS — I5032 Chronic diastolic (congestive) heart failure: Secondary | ICD-10-CM | POA: Diagnosis not present

## 2019-04-01 DIAGNOSIS — J45909 Unspecified asthma, uncomplicated: Secondary | ICD-10-CM | POA: Diagnosis not present

## 2019-04-01 DIAGNOSIS — R5383 Other fatigue: Secondary | ICD-10-CM

## 2019-04-01 HISTORY — DX: Type 2 diabetes mellitus without complications: E11.9

## 2019-04-01 HISTORY — PX: STUMP REVISION: SHX6102

## 2019-04-01 HISTORY — PX: APPLICATION OF WOUND VAC: SHX5189

## 2019-04-01 HISTORY — DX: Dyspnea, unspecified: R06.00

## 2019-04-01 HISTORY — DX: Unspecified cataract: H26.9

## 2019-04-01 HISTORY — DX: Pneumonia, unspecified organism: J18.9

## 2019-04-01 HISTORY — DX: Personal history of other medical treatment: Z92.89

## 2019-04-01 LAB — CBC
HCT: 43.8 % (ref 36.0–46.0)
Hemoglobin: 13.8 g/dL (ref 12.0–15.0)
MCH: 31.2 pg (ref 26.0–34.0)
MCHC: 31.5 g/dL (ref 30.0–36.0)
MCV: 99.1 fL (ref 80.0–100.0)
Platelets: 139 10*3/uL — ABNORMAL LOW (ref 150–400)
RBC: 4.42 MIL/uL (ref 3.87–5.11)
RDW: 13.9 % (ref 11.5–15.5)
WBC: 12.5 10*3/uL — ABNORMAL HIGH (ref 4.0–10.5)
nRBC: 0 % (ref 0.0–0.2)

## 2019-04-01 LAB — GLUCOSE, CAPILLARY
Glucose-Capillary: 231 mg/dL — ABNORMAL HIGH (ref 70–99)
Glucose-Capillary: 227 mg/dL — ABNORMAL HIGH (ref 70–99)
Glucose-Capillary: 255 mg/dL — ABNORMAL HIGH (ref 70–99)
Glucose-Capillary: 367 mg/dL — ABNORMAL HIGH (ref 70–99)
Glucose-Capillary: 499 mg/dL — ABNORMAL HIGH (ref 70–99)
Glucose-Capillary: 462 mg/dL — ABNORMAL HIGH (ref 70–99)

## 2019-04-01 LAB — BASIC METABOLIC PANEL
Anion gap: 13 (ref 5–15)
BUN: 14 mg/dL (ref 6–20)
CO2: 23 mmol/L (ref 22–32)
Calcium: 9.3 mg/dL (ref 8.9–10.3)
Chloride: 101 mmol/L (ref 98–111)
Creatinine, Ser: 1.23 mg/dL — ABNORMAL HIGH (ref 0.44–1.00)
GFR calc Af Amer: 55 mL/min — ABNORMAL LOW (ref >60)
GFR calc non Af Amer: 48 mL/min — ABNORMAL LOW (ref >60)
Glucose, Bld: 255 mg/dL — ABNORMAL HIGH (ref 70–99)
Potassium: 4.2 mmol/L (ref 3.5–5.1)
Sodium: 137 mmol/L (ref 135–145)

## 2019-04-01 SURGERY — REVISION, AMPUTATION SITE
Anesthesia: General | Site: Leg Lower | Laterality: Left

## 2019-04-01 MED ORDER — ACETAMINOPHEN 500 MG PO TABS
1000.0000 mg | ORAL_TABLET | Freq: Four times a day (QID) | ORAL | Status: AC
  Administered 2019-04-01 – 2019-04-02 (×4): 1000 mg via ORAL
  Filled 2019-04-01 (×5): qty 2

## 2019-04-01 MED ORDER — METOCLOPRAMIDE HCL 5 MG/ML IJ SOLN: 5.0000 mg | Freq: Three times a day (TID) | INTRAMUSCULAR | Status: DC | PRN

## 2019-04-01 MED ORDER — ALBUTEROL SULFATE (2.5 MG/3ML) 0.083% IN NEBU: 2.5000 mg | INHALATION_SOLUTION | Freq: Four times a day (QID) | RESPIRATORY_TRACT | Status: DC | PRN

## 2019-04-01 MED ORDER — BUPROPION HCL ER (XL) 150 MG PO TB24
300.0000 mg | ORAL_TABLET | Freq: Every day | ORAL | Status: DC
  Administered 2019-04-02 – 2019-04-06 (×5): 300 mg via ORAL
  Filled 2019-04-01 (×5): qty 2

## 2019-04-01 MED ORDER — PROMETHAZINE HCL 25 MG/ML IJ SOLN: 6.2500 mg | INTRAMUSCULAR | Status: DC | PRN

## 2019-04-01 MED ORDER — HYDROMORPHONE HCL 1 MG/ML IJ SOLN
0.2500 mg | INTRAMUSCULAR | Status: DC | PRN
  Administered 2019-04-01: 0.25 mg via INTRAVENOUS
  Administered 2019-04-01: 0.5 mg via INTRAVENOUS

## 2019-04-01 MED ORDER — MEPERIDINE HCL 50 MG/ML IJ SOLN: 6.2500 mg | INTRAMUSCULAR | Status: DC | PRN

## 2019-04-01 MED ORDER — ATENOLOL 50 MG PO TABS
100.0000 mg | ORAL_TABLET | Freq: Every day | ORAL | Status: DC
  Administered 2019-04-02 – 2019-04-06 (×4): 100 mg via ORAL
  Filled 2019-04-01 (×5): qty 2

## 2019-04-01 MED ORDER — INSULIN ASPART 100 UNIT/ML ~~LOC~~ SOLN
0.0000 [IU] | Freq: Every day | SUBCUTANEOUS | Status: DC
  Administered 2019-04-01 – 2019-04-03 (×2): 2 [IU] via SUBCUTANEOUS

## 2019-04-01 MED ORDER — LIDOCAINE 2% (20 MG/ML) 5 ML SYRINGE
INTRAMUSCULAR | Status: DC | PRN
  Administered 2019-04-01: 20 mg via INTRAVENOUS

## 2019-04-01 MED ORDER — METFORMIN HCL ER 500 MG PO TB24
500.0000 mg | ORAL_TABLET | Freq: Every day | ORAL | Status: DC
  Administered 2019-04-01 – 2019-04-06 (×6): 500 mg via ORAL
  Filled 2019-04-01 (×6): qty 1

## 2019-04-01 MED ORDER — OXYCODONE HCL 5 MG PO TABS
ORAL_TABLET | ORAL | Status: AC
  Filled 2019-04-01: qty 2

## 2019-04-01 MED ORDER — VITAMIN D 25 MCG (1000 UNIT) PO TABS
1000.0000 [IU] | ORAL_TABLET | Freq: Every day | ORAL | Status: DC
  Administered 2019-04-01 – 2019-04-06 (×6): 1000 [IU] via ORAL
  Filled 2019-04-01 (×6): qty 1

## 2019-04-01 MED ORDER — SUCCINYLCHOLINE CHLORIDE 200 MG/10ML IV SOSY
PREFILLED_SYRINGE | INTRAVENOUS | Status: DC | PRN
  Administered 2019-04-01: 100 mg via INTRAVENOUS

## 2019-04-01 MED ORDER — VITAMIN C 500 MG PO TABS
500.0000 mg | ORAL_TABLET | Freq: Every day | ORAL | Status: DC
  Administered 2019-04-01 – 2019-04-06 (×6): 500 mg via ORAL
  Filled 2019-04-01 (×6): qty 1

## 2019-04-01 MED ORDER — MIRABEGRON ER 25 MG PO TB24
50.0000 mg | ORAL_TABLET | Freq: Every day | ORAL | Status: DC
  Administered 2019-04-02 – 2019-04-06 (×5): 50 mg via ORAL
  Filled 2019-04-01 (×5): qty 2

## 2019-04-01 MED ORDER — DEXAMETHASONE SODIUM PHOSPHATE 10 MG/ML IJ SOLN
INTRAMUSCULAR | Status: DC | PRN
  Administered 2019-04-01: 5 mg via INTRAVENOUS

## 2019-04-01 MED ORDER — CEFAZOLIN SODIUM-DEXTROSE 2-4 GM/100ML-% IV SOLN
2.0000 g | INTRAVENOUS | Status: AC
  Administered 2019-04-01: 2 g via INTRAVENOUS
  Filled 2019-04-01: qty 100

## 2019-04-01 MED ORDER — OXYCODONE HCL 5 MG PO TABS
10.0000 mg | ORAL_TABLET | ORAL | Status: DC | PRN
  Administered 2019-04-03: 15 mg via ORAL
  Administered 2019-04-06: 10 mg via ORAL
  Filled 2019-04-01: qty 2
  Filled 2019-04-01: qty 3
  Filled 2019-04-01: qty 2
  Filled 2019-04-01: qty 3

## 2019-04-01 MED ORDER — METHOCARBAMOL 1000 MG/10ML IJ SOLN
500.0000 mg | Freq: Four times a day (QID) | INTRAVENOUS | Status: DC | PRN
  Administered 2019-04-01 – 2019-04-05 (×2): 500 mg via INTRAVENOUS
  Filled 2019-04-01: qty 5
  Filled 2019-04-01: qty 500

## 2019-04-01 MED ORDER — DULOXETINE HCL 30 MG PO CPEP
30.0000 mg | ORAL_CAPSULE | Freq: Every day | ORAL | Status: DC
  Administered 2019-04-01 – 2019-04-06 (×6): 30 mg via ORAL
  Filled 2019-04-01 (×6): qty 1

## 2019-04-01 MED ORDER — IPRATROPIUM BROMIDE 0.02 % IN SOLN: 0.5000 mg | Freq: Four times a day (QID) | RESPIRATORY_TRACT | Status: DC | PRN

## 2019-04-01 MED ORDER — CHLORHEXIDINE GLUCONATE 4 % EX LIQD: 60.0000 mL | Freq: Once | CUTANEOUS | Status: DC

## 2019-04-01 MED ORDER — BENAZEPRIL HCL 5 MG PO TABS
40.0000 mg | ORAL_TABLET | Freq: Every day | ORAL | Status: DC
  Administered 2019-04-02 – 2019-04-06 (×4): 40 mg via ORAL
  Filled 2019-04-01 (×6): qty 8

## 2019-04-01 MED ORDER — AMLODIPINE BESY-BENAZEPRIL HCL 10-40 MG PO CAPS: 1.0000 | ORAL_CAPSULE | Freq: Every day | ORAL | Status: DC

## 2019-04-01 MED ORDER — FENTANYL CITRATE (PF) 250 MCG/5ML IJ SOLN
INTRAMUSCULAR | Status: DC | PRN
  Administered 2019-04-01: 100 ug via INTRAVENOUS

## 2019-04-01 MED ORDER — ACETAMINOPHEN 325 MG PO TABS: 325.0000 mg | ORAL_TABLET | Freq: Four times a day (QID) | ORAL | Status: DC | PRN

## 2019-04-01 MED ORDER — ONDANSETRON HCL 4 MG PO TABS: 4.0000 mg | ORAL_TABLET | Freq: Four times a day (QID) | ORAL | Status: DC | PRN

## 2019-04-01 MED ORDER — HYDROMORPHONE HCL 1 MG/ML IJ SOLN
INTRAMUSCULAR | Status: AC
  Filled 2019-04-01: qty 1

## 2019-04-01 MED ORDER — ONDANSETRON HCL 4 MG/2ML IJ SOLN
4.0000 mg | Freq: Four times a day (QID) | INTRAMUSCULAR | Status: DC | PRN
  Administered 2019-04-03: 4 mg via INTRAVENOUS
  Filled 2019-04-01: qty 2

## 2019-04-01 MED ORDER — FENTANYL CITRATE (PF) 250 MCG/5ML IJ SOLN
INTRAMUSCULAR | Status: AC
  Filled 2019-04-01: qty 5

## 2019-04-01 MED ORDER — ONDANSETRON HCL 4 MG/2ML IJ SOLN
INTRAMUSCULAR | Status: DC | PRN
  Administered 2019-04-01: 4 mg via INTRAVENOUS

## 2019-04-01 MED ORDER — INSULIN DEGLUDEC 200 UNIT/ML ~~LOC~~ SOPN: 70.0000 [IU] | PEN_INJECTOR | Freq: Every day | SUBCUTANEOUS | Status: DC

## 2019-04-01 MED ORDER — FLUCONAZOLE 100 MG PO TABS
100.0000 mg | ORAL_TABLET | Freq: Every day | ORAL | Status: DC
  Administered 2019-04-01 – 2019-04-06 (×6): 100 mg via ORAL
  Filled 2019-04-01 (×6): qty 1

## 2019-04-01 MED ORDER — LACTATED RINGERS IV SOLN
INTRAVENOUS | Status: DC
  Administered 2019-04-01: 08:00:00 via INTRAVENOUS

## 2019-04-01 MED ORDER — AMLODIPINE BESYLATE 10 MG PO TABS
10.0000 mg | ORAL_TABLET | Freq: Every day | ORAL | Status: DC
  Administered 2019-04-02 – 2019-04-06 (×4): 10 mg via ORAL
  Filled 2019-04-01 (×6): qty 1

## 2019-04-01 MED ORDER — VITAMIN B-12 100 MCG PO TABS
100.0000 ug | ORAL_TABLET | Freq: Every day | ORAL | Status: DC
  Administered 2019-04-01 – 2019-04-06 (×6): 100 ug via ORAL
  Filled 2019-04-01 (×7): qty 1

## 2019-04-01 MED ORDER — INSULIN GLARGINE 100 UNIT/ML ~~LOC~~ SOLN
70.0000 [IU] | Freq: Every day | SUBCUTANEOUS | Status: DC
  Administered 2019-04-01 – 2019-04-06 (×6): 70 [IU] via SUBCUTANEOUS
  Filled 2019-04-01 (×8): qty 0.7

## 2019-04-01 MED ORDER — FESOTERODINE FUMARATE ER 8 MG PO TB24: 8.0000 mg | ORAL_TABLET | Freq: Every day | ORAL | Status: DC

## 2019-04-01 MED ORDER — HYDROCHLOROTHIAZIDE 25 MG PO TABS
25.0000 mg | ORAL_TABLET | Freq: Every day | ORAL | Status: DC
  Administered 2019-04-02 – 2019-04-06 (×4): 25 mg via ORAL
  Filled 2019-04-01 (×5): qty 1

## 2019-04-01 MED ORDER — ROSUVASTATIN CALCIUM 20 MG PO TABS
20.0000 mg | ORAL_TABLET | Freq: Every day | ORAL | Status: DC
  Administered 2019-04-01 – 2019-04-05 (×5): 20 mg via ORAL
  Filled 2019-04-01 (×5): qty 1

## 2019-04-01 MED ORDER — METOCLOPRAMIDE HCL 5 MG PO TABS: 5.0000 mg | ORAL_TABLET | Freq: Three times a day (TID) | ORAL | Status: DC | PRN

## 2019-04-01 MED ORDER — MIDAZOLAM HCL 2 MG/2ML IJ SOLN
INTRAMUSCULAR | Status: AC
  Filled 2019-04-01: qty 2

## 2019-04-01 MED ORDER — PROPOFOL 10 MG/ML IV BOLUS
INTRAVENOUS | Status: DC | PRN
  Administered 2019-04-01: 120 mg via INTRAVENOUS

## 2019-04-01 MED ORDER — MIDAZOLAM HCL 2 MG/2ML IJ SOLN: 0.5000 mg | Freq: Once | INTRAMUSCULAR | Status: DC | PRN

## 2019-04-01 MED ORDER — HYDROXYZINE HCL 25 MG PO TABS: 25.0000 mg | ORAL_TABLET | Freq: Three times a day (TID) | ORAL | Status: DC | PRN

## 2019-04-01 MED ORDER — PANTOPRAZOLE SODIUM 40 MG PO TBEC
40.0000 mg | DELAYED_RELEASE_TABLET | Freq: Every day | ORAL | Status: DC
  Administered 2019-04-01 – 2019-04-06 (×6): 40 mg via ORAL
  Filled 2019-04-01 (×6): qty 1

## 2019-04-01 MED ORDER — DOCUSATE SODIUM 100 MG PO CAPS
100.0000 mg | ORAL_CAPSULE | Freq: Two times a day (BID) | ORAL | Status: DC
  Administered 2019-04-01 – 2019-04-06 (×7): 100 mg via ORAL
  Filled 2019-04-01 (×11): qty 1

## 2019-04-01 MED ORDER — ADULT MULTIVITAMIN W/MINERALS CH
1.0000 | ORAL_TABLET | Freq: Every day | ORAL | Status: DC
  Administered 2019-04-01 – 2019-04-06 (×6): 1 via ORAL
  Filled 2019-04-01 (×5): qty 1

## 2019-04-01 MED ORDER — HYDROMORPHONE HCL 1 MG/ML IJ SOLN
0.5000 mg | INTRAMUSCULAR | Status: DC | PRN
  Administered 2019-04-03: 1 mg via INTRAVENOUS
  Filled 2019-04-01: qty 1

## 2019-04-01 MED ORDER — PREGABALIN 75 MG PO CAPS
200.0000 mg | ORAL_CAPSULE | Freq: Three times a day (TID) | ORAL | Status: DC
  Administered 2019-04-01 – 2019-04-06 (×16): 200 mg via ORAL
  Filled 2019-04-01 (×16): qty 2

## 2019-04-01 MED ORDER — OXYCODONE HCL 5 MG PO TABS
5.0000 mg | ORAL_TABLET | ORAL | Status: DC | PRN
  Administered 2019-04-01 – 2019-04-03 (×6): 10 mg via ORAL
  Administered 2019-04-03: 5 mg via ORAL
  Administered 2019-04-03 – 2019-04-04 (×2): 10 mg via ORAL
  Filled 2019-04-01 (×7): qty 2

## 2019-04-01 MED ORDER — TRAZODONE HCL 100 MG PO TABS
200.0000 mg | ORAL_TABLET | Freq: Every day | ORAL | Status: DC
  Administered 2019-04-01 – 2019-04-03 (×3): 200 mg via ORAL
  Filled 2019-04-01 (×4): qty 2

## 2019-04-01 MED ORDER — 0.9 % SODIUM CHLORIDE (POUR BTL) OPTIME
TOPICAL | Status: DC | PRN
  Administered 2019-04-01: 1000 mL

## 2019-04-01 MED ORDER — MIDAZOLAM HCL 2 MG/2ML IJ SOLN
INTRAMUSCULAR | Status: DC | PRN
  Administered 2019-04-01: 2 mg via INTRAVENOUS

## 2019-04-01 MED ORDER — INSULIN ASPART 100 UNIT/ML ~~LOC~~ SOLN
0.0000 [IU] | Freq: Three times a day (TID) | SUBCUTANEOUS | Status: DC
  Administered 2019-04-02: 11 [IU] via SUBCUTANEOUS
  Administered 2019-04-02: 8 [IU] via SUBCUTANEOUS
  Administered 2019-04-02: 11 [IU] via SUBCUTANEOUS
  Administered 2019-04-03: 5 [IU] via SUBCUTANEOUS
  Administered 2019-04-03 – 2019-04-04 (×2): 3 [IU] via SUBCUTANEOUS
  Administered 2019-04-05: 2 [IU] via SUBCUTANEOUS
  Administered 2019-04-05: 1 [IU] via SUBCUTANEOUS
  Administered 2019-04-06: 2 [IU] via SUBCUTANEOUS
  Administered 2019-04-06: 3 [IU] via SUBCUTANEOUS
  Administered 2019-04-06: 5 [IU] via SUBCUTANEOUS

## 2019-04-01 MED ORDER — ALBUTEROL SULFATE HFA 108 (90 BASE) MCG/ACT IN AERS: 1.0000 | INHALATION_SPRAY | RESPIRATORY_TRACT | Status: DC | PRN

## 2019-04-01 MED ORDER — FLUTICASONE PROPIONATE 50 MCG/ACT NA SUSP
1.0000 | Freq: Every day | NASAL | Status: DC
  Administered 2019-04-02 – 2019-04-06 (×5): 1 via NASAL
  Filled 2019-04-01: qty 16

## 2019-04-01 MED ORDER — UMECLIDINIUM-VILANTEROL 62.5-25 MCG/INH IN AEPB
1.0000 | INHALATION_SPRAY | Freq: Every day | RESPIRATORY_TRACT | Status: DC
  Administered 2019-04-01 – 2019-04-06 (×7): 1 via RESPIRATORY_TRACT
  Filled 2019-04-01: qty 14

## 2019-04-01 MED ORDER — INSULIN ASPART 100 UNIT/ML ~~LOC~~ SOLN
0.0000 [IU] | Freq: Three times a day (TID) | SUBCUTANEOUS | Status: DC
  Administered 2019-04-01: 9 [IU] via SUBCUTANEOUS
  Administered 2019-04-01: 5 [IU] via SUBCUTANEOUS

## 2019-04-01 SURGICAL SUPPLY — 32 items
BLADE SAW RECIP 87.9 MT (BLADE) IMPLANT
BLADE SURG 21 STRL SS (BLADE) ×4 IMPLANT
CANISTER WOUND CARE 500ML ATS (WOUND CARE) ×4 IMPLANT
COVER SURGICAL LIGHT HANDLE (MISCELLANEOUS) ×4 IMPLANT
COVER WAND RF STERILE (DRAPES) ×4 IMPLANT
DRAPE EXTREMITY T 121X128X90 (DISPOSABLE) ×4 IMPLANT
DRAPE HALF SHEET 40X57 (DRAPES) ×4 IMPLANT
DRAPE INCISE IOBAN 66X45 STRL (DRAPES) ×4 IMPLANT
DRAPE U-SHAPE 47X51 STRL (DRAPES) ×8 IMPLANT
DRESSING PREVENA PLUS CUSTOM (GAUZE/BANDAGES/DRESSINGS) ×2 IMPLANT
DRSG PREVENA PLUS CUSTOM (GAUZE/BANDAGES/DRESSINGS) ×4
DURAPREP 26ML APPLICATOR (WOUND CARE) ×4 IMPLANT
ELECT REM PT RETURN 9FT ADLT (ELECTROSURGICAL) ×4
ELECTRODE REM PT RTRN 9FT ADLT (ELECTROSURGICAL) ×2 IMPLANT
GLOVE BIOGEL PI IND STRL 9 (GLOVE) ×2 IMPLANT
GLOVE BIOGEL PI INDICATOR 9 (GLOVE) ×2
GLOVE SURG ORTHO 9.0 STRL STRW (GLOVE) ×4 IMPLANT
GOWN STRL REUS W/ TWL XL LVL3 (GOWN DISPOSABLE) ×4 IMPLANT
GOWN STRL REUS W/TWL XL LVL3 (GOWN DISPOSABLE) ×8
KIT BASIN OR (CUSTOM PROCEDURE TRAY) ×4 IMPLANT
KIT TURNOVER KIT B (KITS) ×4 IMPLANT
MANIFOLD NEPTUNE II (INSTRUMENTS) ×4 IMPLANT
NS IRRIG 1000ML POUR BTL (IV SOLUTION) ×4 IMPLANT
PACK GENERAL/GYN (CUSTOM PROCEDURE TRAY) ×4 IMPLANT
PAD ARMBOARD 7.5X6 YLW CONV (MISCELLANEOUS) ×4 IMPLANT
PREVENA RESTOR ARTHOFORM 33X30 (CANNISTER) ×2 IMPLANT
PREVENA RESTOR ARTHOFORM 46X30 (CANNISTER) ×4 IMPLANT
STAPLER VISISTAT 35W (STAPLE) IMPLANT
SUT ETHILON 2 0 PSLX (SUTURE) ×10 IMPLANT
SUT SILK 2 0 (SUTURE)
SUT SILK 2-0 18XBRD TIE 12 (SUTURE) IMPLANT
TOWEL OR 17X26 10 PK STRL BLUE (TOWEL DISPOSABLE) ×4 IMPLANT

## 2019-04-01 NOTE — Anesthesia Postprocedure Evaluation (Signed)
Anesthesia Post Note  Patient: Jennifer [name]  Procedure(s) Performed: REVISION LEFT BELOW KNEE AMPUTATION (Left Leg Lower) Application Of Wound Vac     Patient location during evaluation: PACU Anesthesia Type: General Level of consciousness: awake and alert, patient cooperative and oriented Pain management: pain level controlled Vital Signs Assessment: post-procedure vital signs reviewed and stable Respiratory status: spontaneous breathing, nonlabored ventilation and respiratory function stable Cardiovascular status: blood pressure returned to baseline and stable Postop Assessment: no apparent nausea or vomiting Anesthetic complications: no    Last Vitals:  Vitals:   04/01/19 1120 04/01/19 1143  BP: (!) 151/63 (!) 142/78  Pulse: 72 71  Resp: 13 16  Temp: 36.5 C (!) 36.4 C  SpO2: 97% 93%    Last Pain:  Vitals:   04/01/19 1143  TempSrc: Oral  PainSc:                  Solveig Fangman,E. Tyreese Thain

## 2019-04-01 NOTE — H&P (Signed)
Jennifer [name] is an 58 y.o. female.   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  She reports that she developed an open wound with drainage over the past several weeks.  She denies any trauma to the area.  She has been using the same prosthetic.  She is currently being treated with oral antibiotics without improvement.  [facility] her [facility]. Patient has poorly controlled diabetes with a hemoglobin A1c of 9.3.  She has had protein caloric malnutrition in the past as well.  She has diabetes type 2 with peripheral neuropathy, hypertension and severe peripheral vascular disease, COPD and continues to smoke cigarettes, history of endocarditis in the past, fibromyalgia, rheumatoid arthritis and fatty liver disease. Past Medical History:  Diagnosis Date  . Anginal pain [facility])    '3' of 10 ischemia ruled out 9/9   . Arthritis of lumbar spine   . Asthma   . Cataract   . CHF (congestive heart failure) ([place])   . 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 Chronic bronchitis ([place])    "I get it alot" (09/28/2013)  . Chronic diastolic heart failure (HCC)    grade 2 per 2D echocardiogram (01/2013)  . Chronic lower back pain   . Chronic osteomyelitis of foot (HCC)    chronic, right secondary to diabetic foot ulcers  . Chronic pain syndrome 12/03/2011   Likely secondary to depression, "fibromyalgia", neuropathy, and obesity. Lumbar MRI 2014 no sig change from prior (2008) : Stable hypertrophic facet disease most notable at L4-5. Stable shallow left foraminal/extraforaminal disc protrusion at L4-5. No direct neural compression.      Marland Kitchen COPD 01/08/2007   PFT's 05/2007 : FEV1/FVC 82, FEV1 64% pred, FEF 25-75% 40% predicted, 16% improvement in FEV1 with bronchodilators.     . Depression   . Diabetes  mellitus without complication ([place])    Type II  . Diabetic peripheral neuropathy ([place])   . DVT of upper extremity (deep vein thrombosis) ([place]) 03/11/2013   Secondary to PICC line. Right brachial vein, diagnosed on 03/10/2013 Coumadin for 3 months. End date 06/10/2013   . Dyspnea    "smoker"  . Environmental allergies    Hx: of  . Exertional shortness of breath   . Fatty liver 2003   observed on ultrasound abdomen  . Fibromyalgia   . GERD (gastroesophageal reflux disease)   . Glaucoma   . History of use of hearing aid   . Hyperlipidemia   . Hyperplastic colon polyp 12/2010   Per colonoscopy (12/2010) - Dr. Deatra Ina  . Hypertension   . 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 Blood cultures 01/26/13 S. Aureus and GBS. Blood cultures Feb 6th, 8th, and 9th and March were negative.Repeat TEE 3/20 negative for vegitations  . 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 2/2 tobacco use, DM, and cont weight bearing on surgical wound and developed gangrene   . Pneumonia   . Polymicrobial bacterial infection 01/2013   GBS and S. aureus bacteremia // Source likely infected diabetic foot ulcer  . PVD (peripheral vascular disease) with claudication ([place])    Stents to bilateral common iliac arteries (left 2005, right 2008), on chronic plavix  . Rheumatoid arthritis ([place])   .  Tobacco abuse   . Type II diabetes mellitus with peripheral circulatory disorders, uncontrolled DX: 1993   Insulin dep. Poor control. Complicated by diabetic foot ulcer and diabetic eye disease.    Marland Kitchen Ulcer of foot, chronic (HCC)    Left. No OM per MRI (01/2013)    Past Surgical History:  Procedure Laterality Date  . ABDOMINAL HYSTERECTOMY  1997   secondary to uterine fibroids  . AMPUTATION Left 08/31/2013   Procedure: AMPUTATION RAY;  Surgeon: Newt Minion, MD;  Location: [place];  Service: Orthopedics;   Laterality: Left;  Left Foot 5th Ray Amputation  . AMPUTATION Left 09/28/2013   Procedure: Left Midfoot amputation;  Surgeon: Newt Minion, MD;  Location: [place];  Service: Orthopedics;  Laterality: Left;  Left Midfoot amputation  . AMPUTATION Left 10/14/2013   Procedure: AMPUTATION BELOW KNEE- left;  Surgeon: Newt Minion, MD;  Location: [place];  Service: Orthopedics;  Laterality: Left;  Left Below Knee Amputation   . AMPUTATION TOE Right 01/15/2017   Procedure: AMPUTATION 5th TOE RIGHT FOOT;  Surgeon: Edrick Kins, DPM;  Location: [place];  Service: Podiatry;  Laterality: Right;  . BLADDER SURGERY     bladder reconstruction surgery  . BREAST BIOPSY     multiple-benign per pt  . COLONOSCOPY    . ESOPHAGOGASTRODUODENOSCOPY N/A 09/20/2013   Procedure: ESOPHAGOGASTRODUODENOSCOPY (EGD);  Surgeon: Jerene Bears, MD;  Location: [place];  Service: Gastroenterology;  Laterality: N/A;  . FOOT AMPUTATION THROUGH METATARSAL Left 09/28/2013  . GANGLION CYST EXCISION     multiple  . PERIPHERAL VASCULAR INTERVENTION     stents in lower ext  . SHOULDER ARTHROSCOPY W/ ROTATOR CUFF REPAIR Bilateral   . SKIN SPLIT GRAFT Bilateral 05/13/2013   Procedure: Right and Left Foot Allograft Skin Graft;  Surgeon: Newt Minion, MD;  Location: [place];  Service: Orthopedics;  Laterality: Bilateral;  Right and Left Foot Allograft Skin Graft  . TEE WITHOUT CARDIOVERSION N/A 01/31/2013   Procedure: TRANSESOPHAGEAL ECHOCARDIOGRAM (TEE);  Surgeon: Fay Records, MD;  Location: [place];  Service: Cardiovascular;  Laterality: N/A;  Rm [phone]  . TEE WITHOUT CARDIOVERSION N/A 03/10/2013   Procedure: TRANSESOPHAGEAL ECHOCARDIOGRAM (TEE);  Surgeon: Larey Dresser, MD;  Location: [place];  Service: Cardiovascular;  Laterality: N/A;  Rm. [phone]  . TOE AMPUTATION Left 08/31/2013   4TH & 5 TH TOE   . TONSILLECTOMY    . TUBAL LIGATION    . WRIST SURGERY Right    "for tumors" (09/28/2013)    Family History  Problem Relation  Age of Onset  . Diverticulosis Mother   . Diabetes Mother   . Hypertension Mother   . Congestive Heart Failure Mother   . Asthma Father   . CAD Sister 31       MI at age 38 per patient.  However, she has not had a stent or CABG.   . Heart disease Sister        before age 67  . Breast cancer Neg Hx    Social History:  reports that she has been smoking cigarettes. She has a 50.00 pack-year smoking history. She has never used smokeless tobacco. She reports that she does not drink alcohol or use drugs.  Allergies:  Allergies  Allergen Reactions  . Iohexol Other (See Comments)     Desc: IV CONTRAST CAUSE NEPHROPATHY IN 2007   . Ivp Dye [Iodinated Diagnostic Agents] Other (See Comments)    Desc: IV CONTRAST  CAUSE NEPHROPATHY IN 2007  . Abilify [Aripiprazole] Other (See Comments)    Urinary freq Nov 2016  . Morphine Sulfate Itching and Rash    Medications Prior to Admission  Medication Sig Dispense Refill  . albuterol (PROVENTIL HFA;VENTOLIN HFA) 108 (90 Base) MCG/ACT inhaler Inhale 1-2 puffs into the lungs every 4 (four) hours as needed for wheezing or shortness of breath.    Marland Kitchen albuterol (PROVENTIL) (2.5 MG/3ML) 0.083% nebulizer solution Take 3 mLs (2.5 mg total) by nebulization every 6 (six) hours as needed for wheezing.    Marland Kitchen amLODipine-benazepril (LOTREL) 10-40 MG capsule TAKE 1 CAPSULE BY MOUTH EVERY DAY 90 capsule 3  . atenolol (TENORMIN) 100 MG tablet TAKE 1 TABLET BY MOUTH EVERY DAY (Patient taking differently: Take 100 mg by mouth daily. ) 90 tablet 3  . baclofen (LIORESAL) 10 MG tablet TAKE 1 TABLET BY MOUTH THREE TIMES A DAY AS NEEDED FOR MUSCLE SPASMS (Patient taking differently: Take 10 mg by mouth 3 (three) times daily as needed for muscle spasms. ) 90 tablet 5  . betamethasone dipropionate (DIPROLENE) 0.05 % cream Apply 1 application topically 2 (two) times daily as needed (irritation).    Marland Kitchen buPROPion (WELLBUTRIN XL) 300 MG 24 hr tablet TAKE 1 TABLET BY MOUTH DAILY (Patient  taking differently: Take 300 mg by mouth daily. ) 90 tablet 3  . cholecalciferol (VITAMIN D) 1000 units tablet Take 1,000 Units by mouth daily.    . DULoxetine (CYMBALTA) 30 MG capsule TAKE 1 CAPSULE BY MOUTH EVERY DAY (Patient taking differently: Take 30 mg by mouth daily. ) 90 capsule 3  . fesoterodine (TOVIAZ) 8 MG TB24 tablet Take 8 mg by mouth daily.     . fluconazole (DIFLUCAN) 200 MG tablet Take 2 tablets (400 mg total) by mouth daily for 1 day, THEN 1 tablet (200 mg total) daily for 13 days. 15 tablet 0  . fluticasone (FLONASE) 50 MCG/ACT nasal spray INSTILL 1 SPRAY IN EACH NOSTRIL DAILY 48 g 3  . furosemide (LASIX) 40 MG tablet TAKE ONE TABLET BY MOUTH DAILY AS NEEDED (Patient taking differently: Take 40 mg by mouth daily as needed for fluid. ) 90 tablet 5  . hydrochlorothiazide (HYDRODIURIL) 25 MG tablet TAKE 1 TABLET BY MOUTH ONCE DAILY (Patient taking differently: Take 25 mg by mouth daily. ) 90 tablet 3  . hydrOXYzine (ATARAX/VISTARIL) 25 MG tablet TAKE 1 TABLET (25 MG TOTAL) BY MOUTH 3 (THREE) TIMES DAILY AS NEEDED FOR ITCHING. 90 tablet 5  . Insulin Degludec (TRESIBA FLEXTOUCH) 200 UNIT/ML SOPN Inject 66 Units into the skin daily. (Patient taking differently: Inject 70 Units into the skin daily. ) 45 mL 2  . ipratropium (ATROVENT) 0.02 % nebulizer solution USE 1 VIAL VIA NEBULIZER EVERY 6 HOURS AS NEEDED FOR WHEEZING (Patient taking differently: Take 0.5 mg by nebulization every 6 (six) hours as needed for wheezing. ) 62.5 mL 3  . metFORMIN (GLUCOPHAGE-XR) 500 MG 24 hr tablet TAKE 1 TABLET BY MOUTH EVERY DAY WITH BREAKFAST (Patient taking differently: Take 500 mg by mouth daily with breakfast. ) 270 tablet 3  . mirabegron ER (MYRBETRIQ) 50 MG TB24 tablet Take 1 tablet (50 mg total) by mouth daily. 90 tablet 3  . Multiple Vitamins-Minerals (MULTIVITAMIN WITH MINERALS) tablet Take 1 tablet by mouth daily.    . NYSTATIN powder APPLY TO AFFECTED AREA THREE TIMES A DAY (Patient taking  differently: Apply 1 g topically 3 (three) times daily as needed (irritation). ) 60 g 1  .  omeprazole (PRILOSEC) 20 MG capsule Take 1 capsule (20 mg total) by mouth 2 (two) times daily before a meal. 180 capsule 3  . oxyCODONE-acetaminophen (PERCOCET/ROXICET) 5-325 MG tablet Take 1 tablet by mouth every 4 (four) hours as needed for severe pain.    . pregabalin (LYRICA) 200 MG capsule TAKE 1 CAPSULE BY MOUTH THREE TIMES A DAY (Patient taking differently: Take 200 mg by mouth 3 (three) times daily. ) 270 capsule 3  . rosuvastatin (CRESTOR) 20 MG tablet TAKE 1 TABLET BY MOUTH AT BEDTIME (Patient taking differently: Take 20 mg by mouth at bedtime. ) 90 tablet 3  . traZODone (DESYREL) 100 MG tablet TAKE ONE TABLET BY MOUTH DAILY AT BEDTIME AS NEEDED FOR SLEEP (Patient taking differently: Take 200 mg by mouth at bedtime as needed for sleep. ) 90 tablet 3  . umeclidinium-vilanterol (ANORO ELLIPTA) 62.5-25 MCG/INH AEPB Inhale 1 puff into the lungs daily. 60 each 11  . vitamin B-12 (CYANOCOBALAMIN) 100 MCG tablet Take 100 mcg by mouth daily.    . vitamin C (ASCORBIC ACID) 500 MG tablet Take 500 mg by mouth daily.    Marland Kitchen doxycycline (VIBRA-TABS) 100 MG tablet Take 1 tablet (100 mg total) by mouth 2 (two) times daily. (Patient not taking: Reported on 03/29/2019) 14 tablet 0  . EASY TOUCH PEN NEEDLES 31G X 8 MM MISC USE TO INJECT INSULIN TWICE DAILY 200 each 1  . ibuprofen (ADVIL,MOTRIN) 800 MG tablet Take 1 tablet (800 mg total) by mouth 3 (three) times daily. (Patient not taking: Reported on 03/29/2019) 21 tablet 0  . ONETOUCH DELICA LANCETS 39J MISC USE TO CHECK BLOOD SUGARS FOUR TIMES A DAY 100 each 3  . ONETOUCH VERIO test strip USE 3-4 TIMES DAILY TO CHECK BLOOD SUGAR 100 each 5    Results for orders placed or performed during the hospital encounter of 04/01/19 (from the past 48 hour(s))  Basic metabolic panel     Status: Abnormal   Collection Time: 04/01/19  6:21 AM  Result Value Ref Range   Sodium 137 135  - 145 mmol/L   Potassium 4.2 3.5 - 5.1 mmol/L   Chloride 101 98 - 111 mmol/L   CO2 23 22 - 32 mmol/L   Glucose, Bld 255 (H) 70 - 99 mg/dL   BUN 14 6 - 20 mg/dL   Creatinine, Ser 1.23 (H) 0.44 - 1.00 mg/dL   Calcium 9.3 8.9 - 10.3 mg/dL   GFR calc non Af Amer 48 (L) >60 mL/min   GFR calc Af Amer 55 (L) >60 mL/min   Anion gap 13 5 - 15    Comment: Performed at [facility], [place] [address] [place], [place] [address]  CBC     Status: Abnormal   Collection Time: 04/01/19  6:21 AM  Result Value Ref Range   WBC 12.5 (H) 4.0 - 10.5 K/uL   RBC 4.42 3.87 - 5.11 MIL/uL   Hemoglobin 13.8 12.0 - 15.0 g/dL   HCT 43.8 36.0 - 46.0 %   MCV 99.1 80.0 - 100.0 fL   MCH 31.2 26.0 - 34.0 pg   MCHC 31.5 30.0 - 36.0 g/dL   RDW 13.9 11.5 - 15.5 %   Platelets 139 (L) 150 - 400 K/uL    Comment: REPEATED TO VERIFY   nRBC 0.0 0.0 - 0.2 %    Comment: Performed at [facility], [place] [address], [place], [place] [address]   *Note: Due to a large number of  results and/or encounters for the requested time period, some results have not been displayed. A complete set of results can be found in Results Review.   No results found.  Review of Systems  All other systems reviewed and are negative.   Blood pressure (!) 130/99, pulse 79, temperature 97.6 F (36.4 C), resp. rate 16, height 5\' 7"  (1.702 m), weight 116.6 kg, SpO2 98 %. Physical Exam  Constitutional: She is oriented to person, place, and time. She appears well-developed and well-nourished. No distress.  HENT:  Head: Normocephalic and atraumatic.  Neck: No tracheal deviation present. No thyromegaly present.  Cardiovascular: Normal rate.  Respiratory: Effort normal. No stridor. No respiratory distress.  GI: Soft. She exhibits no distension.  Musculoskeletal:     Comments: Examination patient has foul-smelling odor from her transtibial amputation the soft tissue envelope is not viable there is a large open wound approximately 5 cm in  diameter 1 cm deep.  There is no ascending cellulitis no purulent drainage.    Neurological: She is alert and oriented to person, place, and time. No cranial nerve deficit. She exhibits normal muscle tone.  Skin: Skin is warm.  Psychiatric: She has a normal mood and affect. Her behavior is normal. Judgment and thought content normal.     Assessment/Plan Infection/ulceration of remote left transtibial amputation site-plan revision left transtibial amputation.  The procedure and possible risks and complications were discussed with the patient including the risk of bleeding, infection, neurovascular injury, and possible need for further surgery were discussed with the patient and her questions were answered to her satisfaction.  The patient wishes to proceed at this time.  Erlinda Hong, PA-C 04/01/2019, 6:59 AM [facility] [phone]

## 2019-04-01 NOTE — Evaluation (Signed)
Physical Therapy Evaluation Patient Details Name: Jennifer [name] MRN: [id number] DOB: 1961/04/25 Today's Date: 04/01/2019   History of Present Illness  Pt is a 58 y/o female s/p revision of her R transtibial amputation. PMH including but not limited to DM, COPD, HTN, PVD, Fibromyalgia, CHF.    Clinical Impression  Pt presented supine in bed with HOB elevated, awake and willing to participate in therapy session. Prior to admission, pt reported that she ambulated short distances with her L LE prosthesis and RW, primarily uses a w/c for mobility.  Pt has been living with her niece (and several other family members) in a single level home with five steps to enter. Apparently her current w/c does not fit in the home, specifically in the hallways and bathroom. At the time of evaluation, pt able to perform bed mobility with supervision for safety and transfers with RW and min A for stability. Pt would greatly benefit from further intensive therapy services at [facility] prior to returning home with family support.   Pt would continue to benefit from skilled physical therapy services at this time while admitted and after d/c to address the below listed limitations in order to improve overall safety and independence with functional mobility.     Follow Up Recommendations [facility]    Equipment Recommendations  None recommended by PT    Recommendations for Other Services Rehab consult     Precautions / Restrictions Precautions Precautions: Fall Precaution Comments: wound VAC Restrictions Weight Bearing Restrictions: Yes LLE Weight Bearing: Non weight bearing      Mobility  Bed Mobility Overal bed mobility: Needs Assistance Bed Mobility: Supine to Sit;Sit to Supine     Supine to sit: Supervision Sit to supine: Supervision   General bed mobility comments: increased time, supervision for safety, use of bed rail  Transfers Overall transfer level: Needs assistance Equipment used: Rolling walker (2  wheeled) Transfers: Sit to/from Stand Sit to Stand: Min assist         General transfer comment: good technique, increased time and effort, min A for stability and safety  Ambulation/Gait             General Gait Details: unable to hop during evaluation  Stairs            Wheelchair Mobility    Modified Rankin (Stroke Patients Only)       Balance Overall balance assessment: Needs assistance Sitting-balance support: No upper extremity supported Sitting balance-Leahy Scale: Fair     Standing balance support: Bilateral upper extremity supported Standing balance-Leahy Scale: Poor                               Pertinent Vitals/Pain Pain Assessment: Faces Faces Pain Scale: Hurts little more Pain Location: L residual limb Pain Descriptors / Indicators: Sore Pain Intervention(s): Monitored during session;Repositioned    Home Living Family/patient expects to be discharged to:: Private residence Living Arrangements: Other relatives;Other (Comment)(niece) Available Help at Discharge: Family;Available PRN/intermittently Type of Home: House Home Access: Stairs to enter Entrance Stairs-Rails: (rails are "falling apart") Entrance Stairs-Number of Steps: 5 Home Layout: One level Home Equipment: Wheelchair - manual Additional Comments: pt is homeless and has been staying at her niece's house - a four bedroom house with 8 other people. Pt sleeps in the living room    Prior Function Level of Independence: Needs assistance   Gait / Transfers Assistance Needed: can ambulate short distances with her LE  prosthesis and a RW; however, primarily uses a w/c for mobility  ADL's / Homemaking Assistance Needed: has been performing a "wash up" and needed some assistance with dressing        Hand Dominance        Extremity/Trunk Assessment   Upper Extremity Assessment Upper Extremity Assessment: Generalized weakness    Lower Extremity Assessment Lower  Extremity Assessment: Generalized weakness;LLE deficits/detail LLE Deficits / Details: wound VAC in place; pt able to flex and extend knee through full ROM LLE: Unable to fully assess due to pain       Communication   Communication: No difficulties  Cognition Arousal/Alertness: Awake/alert Behavior During Therapy: WFL for tasks assessed/performed Overall Cognitive Status: Within Functional Limits for tasks assessed                                        General Comments      Exercises Amputee Exercises Knee Flexion: AROM;10 reps;Supine;Left Knee Extension: AROM;Left;10 reps;Supine   Assessment/Plan    PT Assessment Patient needs continued PT services  PT Problem List Decreased strength;Decreased balance;Decreased mobility;Decreased coordination;Decreased knowledge of use of DME;Decreased safety awareness;Decreased knowledge of precautions;Pain       PT Treatment Interventions Wheelchair mobility training;Patient/family education;Cognitive remediation;Neuromuscular re-education;Balance training;Therapeutic exercise;Therapeutic activities;Functional mobility training;Stair training;Gait training;DME instruction    PT Goals (Current goals can be found in the Care Plan section)  Acute Rehab PT Goals Patient Stated Goal: get back to her PLOF PT Goal Formulation: With patient Time For Goal Achievement: 04/15/19 Potential to Achieve Goals: Good    Frequency Min 5X/week   Barriers to discharge Inaccessible home environment      Co-evaluation               AM-PAC PT "6 Clicks" Mobility  Outcome Measure Help needed turning from your back to your side while in a flat bed without using bedrails?: A Little Help needed moving from lying on your back to sitting on the side of a flat bed without using bedrails?: A Little Help needed moving to and from a bed to a chair (including a wheelchair)?: A Lot Help needed standing up from a chair using your arms (e.g.,  wheelchair or bedside chair)?: A Little Help needed to walk in hospital room?: A Lot Help needed climbing 3-5 steps with a railing? : Total 6 Click Score: 14    End of Session   Activity Tolerance: Patient tolerated treatment well Patient left: in bed;with call bell/phone within reach;with bed alarm set Nurse Communication: Mobility status PT Visit Diagnosis: Other abnormalities of gait and mobility (R26.89);Muscle weakness (generalized) (M62.81)    Time: 0109-3235 PT Time Calculation (min) (ACUTE ONLY): 27 min   Charges:   PT Evaluation $PT Eval Moderate Complexity: 1 Mod PT Treatments $Therapeutic Activity: 8-22 mins        Sherie Don, PT, DPT  Acute Rehabilitation Services Pager [phone] Office [place] 04/01/2019, 3:59 PM

## 2019-04-01 NOTE — Anesthesia Procedure Notes (Signed)
Procedure Name: Intubation Date/Time: 04/01/2019 9:39 AM Performed by: Imagene Riches, CRNA Pre-anesthesia Checklist: Patient identified, Emergency Drugs available, Suction available and Patient being monitored Patient Re-evaluated:Patient Re-evaluated prior to induction Oxygen Delivery Method: Circle System Utilized Preoxygenation: Pre-oxygenation with 100% oxygen Induction Type: IV induction Laryngoscope Size: Miller and 3 Grade View: Grade I Tube type: Oral Tube size: 7.0 mm Number of attempts: 1 Airway Equipment and Method: Stylet and Oral airway Placement Confirmation: ETT inserted through vocal cords under direct vision,  positive ETCO2 and breath sounds checked- equal and bilateral Secured at: 22 cm Tube secured with: Tape Dental Injury: Teeth and Oropharynx as per pre-operative assessment

## 2019-04-01 NOTE — Op Note (Signed)
04/01/2019  10:24 AM  PATIENT:  Jennifer [name]    PRE-OPERATIVE DIAGNOSIS:  Dehiscence Left Below Knee Amputation  POST-OPERATIVE DIAGNOSIS:  Same  PROCEDURE:  REVISION LEFT BELOW KNEE AMPUTATION, Application Of Wound Vac  SURGEON:  Newt Minion, MD  PHYSICIAN ASSISTANT:None ANESTHESIA:   General  PREOPERATIVE INDICATIONS:  Jennifer [name] is a  58 y.o. female with a diagnosis of Dehiscence Left Below Knee Amputation who failed conservative measures and elected for surgical management.    The risks benefits and alternatives were discussed with the patient preoperatively including but not limited to the risks of infection, bleeding, nerve injury, cardiopulmonary complications, the need for revision surgery, among others, and the patient was willing to proceed.  OPERATIVE IMPLANTS: Praveena wound VAC customizable and restore  @ENCIMAGES @  OPERATIVE FINDINGS: Good bleeding granulation tissue with healthy muscles no abscess at the level of amputation.  OPERATIVE PROCEDURE: Patient was brought the operating room and underwent a general anesthetic.  After adequate levels anesthesia were obtained patient's left lower extremity was prepped using DuraPrep draped into a sterile field a timeout was called.  A fishmouth incision was made around the necrotic tissue and carried sharply down to bone.  The tibia was resected approximately 3 cm proximal to the exposed bone the fibula was also resected proximal to the tibia.  Electrocautery was used for hemostasis all infected involved tissue was removed the resection margins were clear.  The wound was irrigated with normal saline.  Deep and superficial fascia layers and skin was closed using 2-0 nylon.  The customizable and restore Praveena wound VAC dressings were applied this had a good suction fit patient was extubated taken the PACU in stable condition.   DISCHARGE PLANNING:  Antibiotic duration: 24 hours  Weightbearing: Nonweightbearing on the  left  Pain medication: Opioid pathway  Dressing care/ Wound VAC: Continue wound VAC for 1 week at discharge  Ambulatory devices: Patient will need to remain hospitalized until she is safe with ambulation.  Discharge to: Home.  Patient does not want to go to inpatient or outpatient rehabilitation.  Follow-up: In the office 1 week post operative.

## 2019-04-01 NOTE — Plan of Care (Signed)

## 2019-04-01 NOTE — Transfer of Care (Signed)
Immediate Anesthesia Transfer of Care Note  Patient: Jennifer [name]  Procedure(s) Performed: REVISION LEFT BELOW KNEE AMPUTATION (Left Leg Lower) Application Of Wound Vac  Patient Location: PACU  Anesthesia Type:General  Level of Consciousness: awake, alert  and oriented  Airway & Oxygen Therapy: Patient Spontanous Breathing and Patient connected to face mask oxygen  Post-op Assessment: Report given to RN and Post -op Vital signs reviewed and stable  Post vital signs: Reviewed and stable  Last Vitals:  Vitals Value Taken Time  BP 153/102 04/01/2019 10:22 AM  Temp    Pulse 71 04/01/2019 10:23 AM  Resp 12 04/01/2019 10:23 AM  SpO2 91 % 04/01/2019 10:23 AM  Vitals shown include unvalidated device data.  Last Pain: There were no vitals filed for this visit.       Complications: No apparent anesthesia complications

## 2019-04-02 ENCOUNTER — Encounter (HOSPITAL_COMMUNITY): Payer: Self-pay | Admitting: Orthopedic Surgery

## 2019-04-02 LAB — GLUCOSE, CAPILLARY
Glucose-Capillary: 333 mg/dL — ABNORMAL HIGH (ref 70–99)
Glucose-Capillary: 273 mg/dL — ABNORMAL HIGH (ref 70–99)
Glucose-Capillary: 304 mg/dL — ABNORMAL HIGH (ref 70–99)
Glucose-Capillary: 198 mg/dL — ABNORMAL HIGH (ref 70–99)

## 2019-04-02 NOTE — Progress Notes (Signed)
[name], female   DOB: [birth year], 58 y.o.   MRN: [id number] Postoperative day 1 left transtibial amputation revision.  Patient states that she has not ambulated without her prosthesis and does not feel safe discharge to home.  Will see if patient can qualify for inpatient rehabilitation.  Currently 150 cc in the wound VAC canister.

## 2019-04-02 NOTE — Progress Notes (Signed)
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 PMH including but not limited to DM, COPD, HTN, PVD, Fibromyalgia, CHF.    PT Comments    Pt agreeable to participate and amenable to showing how well she moves while toileting on the BSC.and doing her own hygiene.    Follow Up Recommendations  CIR     Equipment Recommendations  None recommended by PT    Recommendations for Other Services       Precautions / Restrictions Precautions Precautions: Fall Precaution Comments: wound VAC Restrictions Weight Bearing Restrictions: Yes LLE Weight Bearing: Non weight bearing    Mobility  Bed Mobility Overal bed mobility: Needs Assistance Bed Mobility: Supine to Sit;Sit to Supine     Supine to sit: Supervision Sit to supine: Supervision   General bed mobility comments: increased time, supervision for safety, use of bed rail  Transfers Overall transfer level: Needs assistance Equipment used: Rolling walker (2 wheeled) Transfers: Set designer Transfers;Sit to/from Stand Sit to Stand: Min guard   Squat pivot transfers: Min guard     General transfer comment: safe technique for transfer.  For pericare, pt put weight in the L stump.  Reinforced that this was not appropriate at this stage.  Ambulation/Gait                 Stairs             Wheelchair Mobility    Modified Rankin (Stroke Patients Only)       Balance Overall balance assessment: Needs assistance Sitting-balance support: No upper extremity supported Sitting balance-Leahy Scale: Fair     Standing balance support: Bilateral upper extremity supported Standing balance-Leahy Scale: Poor Standing balance comment: no assist needed, but reliant on stationary objects or AD                            Cognition  Arousal/Alertness: Awake/alert Behavior During Therapy: WFL for tasks assessed/performed Overall Cognitive Status: Within Functional Limits for tasks assessed                                        Exercises      General Comments        Pertinent Vitals/Pain Pain Assessment: 0-10 Pain Score: 8  Faces Pain Scale: Hurts even more Pain Location: L residual limb Pain Descriptors / Indicators: Sore;Grimacing;Guarding Pain Intervention(s): Monitored during session    Home Living Family/patient expects to be discharged to:: Private residence Living Arrangements: Other relatives;Other (Comment)(niece) Available Help at Discharge: Family;Available PRN/intermittently Type of Home: House Home Access: Stairs to enter Entrance Stairs-Rails: (rails are "falling apart") Home Layout: One level Home Equipment: Wheelchair - manual Additional Comments: pt is homeless and has been staying at her niece's house - a four bedroom house with 8 other people. Pt sleeps in the living room    Prior Function Level of Independence: Needs assistance  Gait / Transfers Assistance Needed: can ambulate short distances with her LE prosthesis and a RW; however, primarily uses a w/c for mobility ADL's / Homemaking Assistance Needed: has been performing a "wash up" and needed some assistance with dressing     PT Goals (current goals can now be found in  the care plan section) Acute Rehab PT Goals Patient Stated Goal: get back to her PLOF PT Goal Formulation: With patient Time For Goal Achievement: 04/15/19 Potential to Achieve Goals: Good Progress towards PT goals: Progressing toward goals    Frequency    Min 5X/week      PT Plan Current plan remains appropriate    Co-evaluation              AM-PAC PT "6 Clicks" Mobility   Outcome Measure  Help needed turning from your back to your side while in a flat bed without using bedrails?: A Little Help needed moving from lying on  your back to sitting on the side of a flat bed without using bedrails?: A Little Help needed moving to and from a bed to a chair (including a wheelchair)?: A Little Help needed standing up from a chair using your arms (e.g., wheelchair or bedside chair)?: A Little Help needed to walk in hospital room?: A Lot Help needed climbing 3-5 steps with a railing? : Total 6 Click Score: 15    End of Session   Activity Tolerance: Patient tolerated treatment well Patient left: in bed;with call bell/phone within reach;with bed alarm set Nurse Communication: Mobility status PT Visit Diagnosis: Other abnormalities of gait and mobility (R26.89);Muscle weakness (generalized) (M62.81)     Time: 5009-3818 PT Time Calculation (min) (ACUTE ONLY): 20 min  Charges:  $Therapeutic Activity: 8-22 mins                     04/02/2019  Donnella Sham, PT Acute Rehabilitation Services [phone]  (pager) [phone]  (office)   Jennifer [name] 04/02/2019, 5:57 PM

## 2019-04-02 NOTE — Progress Notes (Signed)
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 PMH including but not limited to DM, COPD, HTN, PVD, Fibromyalgia, CHF.   Clinical Impression   PTA, pt was living at home with family, and reports she was independent with ADL/IADL and functional mobility with her LLE prosthesis and RW. Pt currently requires minA-modA for ADL and is limited in functional mobility secondary to increased pain. Pt attempted to take 1 step forward with RW in preparation for transfers, but required immediate return to sitting due to increased pain level in her LLE. Pt expressed she would like to go to CIR for additional rehab or return home with [facility], but reports she is adamant that she will not go to SNF. Due to decline in current level of function, pt would benefit from acute OT to address established goals to facilitate safe D/C to venue listed below. At this time, recommend CIR follow-up. Will continue to follow acutely.     Follow Up Recommendations  CIR    Equipment Recommendations  3 in 1 bedside commode;Other (comment)(RW)    Recommendations for Other Services Rehab consult     Precautions / Restrictions Precautions Precautions: Fall Precaution Comments: wound VAC Restrictions Weight Bearing Restrictions: Yes LLE Weight Bearing: Non weight bearing      Mobility Bed Mobility Overal bed mobility: Needs Assistance Bed Mobility: Supine to Sit;Sit to Supine     Supine to sit: Supervision Sit to supine: Supervision   General bed mobility comments: increased time, supervision for safety, use of bed rail  Transfers Overall transfer level: Needs assistance Equipment used: Rolling walker (2 wheeled) Transfers: Sit to/from Stand Sit to Stand: Min assist         General transfer comment: good technique, increased time  and effort, min A for stability and safety    Balance Overall balance assessment: Needs assistance Sitting-balance support: No upper extremity supported Sitting balance-Leahy Scale: Fair     Standing balance support: Bilateral upper extremity supported Standing balance-Leahy Scale: Poor                             ADL either performed or assessed with clinical judgement   ADL Overall ADL's : Needs assistance/impaired Eating/Feeding: Set up;Sitting Eating/Feeding Details (indicate cue type and reason): pt cleaning up lunch upon arrival Grooming: Set up;Sitting Grooming Details (indicate cue type and reason): pt wash/dry face  Upper Body Bathing: Min guard;Sitting   Lower Body Bathing: Minimal assistance;Sit to/from stand   Upper Body Dressing : Set up;Sitting   Lower Body Dressing: Minimal assistance;Sit to/from stand   Toilet Transfer: Moderate assistance   Toileting- Clothing Manipulation and Hygiene: Minimal assistance;Moderate assistance       Functional mobility during ADLs: Minimal assistance;Rolling walker General ADL Comments: pt attempted to hop to simulate transfer, required immediate return to sitting secondary to increased pain in amputated LE     Vision Baseline Vision/History: Wears glasses Patient Visual Report: No change from baseline       Perception     Praxis      Pertinent Vitals/Pain Pain Assessment: 0-10 Pain Score: 8  Faces Pain Scale: Hurts little more Pain Location: L residual limb Pain Descriptors / Indicators: Sore;Grimacing;Guarding Pain Intervention(s): Limited activity within patient's tolerance;Monitored during session;Repositioned     Hand Dominance Right  Extremity/Trunk Assessment Upper Extremity Assessment Upper Extremity Assessment: Overall WFL for tasks assessed   Lower Extremity Assessment Lower Extremity Assessment: Generalized weakness;LLE deficits/detail LLE Deficits / Details: wound VAC in place; pt  able to flex and extend knee through full ROM LLE: Unable to fully assess due to pain       Communication Communication Communication: No difficulties   Cognition Arousal/Alertness: Awake/alert Behavior During Therapy: WFL for tasks assessed/performed Overall Cognitive Status: Within Functional Limits for tasks assessed                                     General Comments       Exercises     Shoulder Instructions      Home Living Family/patient expects to be discharged to:: Private residence Living Arrangements: Other relatives;Other (Comment)(niece) Available Help at Discharge: Family;Available PRN/intermittently Type of Home: House Home Access: Stairs to enter [employer] of Steps: 5 Entrance Stairs-Rails: (rails are "falling apart") Home Layout: One level     Bathroom Shower/Tub: Other (comment)(does a "wash-up")   Bathroom Toilet: Standard Bathroom Accessibility: Yes   Home Equipment: Wheelchair - manual   Additional Comments: pt is homeless and has been staying at her niece's house - a four bedroom house with 8 other people. Pt sleeps in the living room      Prior Functioning/Environment Level of Independence: Needs assistance  Gait / Transfers Assistance Needed: can ambulate short distances with her LE prosthesis and a RW; however, primarily uses a w/c for mobility ADL's / Homemaking Assistance Needed: has been performing a "wash up" and needed some assistance with dressing            OT Problem List: Decreased strength;Decreased range of motion;Decreased activity tolerance;Impaired balance (sitting and/or standing);Decreased safety awareness;Decreased knowledge of use of DME or AE;Obesity;Pain      OT Treatment/Interventions: Self-care/ADL training;Therapeutic exercise;Energy conservation;DME and/or AE instruction;Therapeutic activities;Modalities;Patient/family education;Balance training    OT Goals(Current goals can be found  in the care plan section) Acute Rehab OT Goals Patient Stated Goal: get back to her PLOF OT Goal Formulation: With patient Time For Goal Achievement: 04/16/19 Potential to Achieve Goals: Good ADL Goals Pt Will Perform Grooming: with modified independence Pt Will Perform Lower Body Dressing: with modified independence Pt Will Transfer to Toilet: with modified independence;ambulating Pt Will Perform Toileting - Clothing Manipulation and hygiene: with modified independence;sit to/from stand  OT Frequency: Min 2X/week   Barriers to D/C: Inaccessible home environment  pt has 5 stairs to enter house       Co-evaluation              AM-PAC OT "6 Clicks" Daily Activity     Outcome Measure Help from another person eating meals?: None Help from another person taking care of personal grooming?: A Little Help from another person toileting, which includes using toliet, bedpan, or urinal?: A Lot Help from another person bathing (including washing, rinsing, drying)?: A Lot Help from another person to put on and taking off regular upper body clothing?: A Little Help from another person to put on and taking off regular lower body clothing?: A Lot 6 Click Score: 16   End of Session Equipment Utilized During Treatment: Gait belt;Rolling walker(wound vac) Nurse Communication: Mobility status  Activity Tolerance: Patient tolerated treatment well;Patient limited by pain Patient left: in bed;with call bell/phone within reach;with chair alarm set  OT Visit Diagnosis: Unsteadiness  on feet (R26.81);Other abnormalities of gait and mobility (R26.89);Muscle weakness (generalized) (M62.81);Pain Pain - Right/Left: Left Pain - part of body: Leg                Time: 1434-1500 OT Time Calculation (min): 26 min Charges:  OT General Charges $OT Visit: 1 Visit OT Evaluation $OT Eval Moderate Complexity: 1 Mod OT Treatments $Self Care/Home Management : 8-22 mins  Dorinda Hill OTR/L Acute  Rehabilitation Services Office: [phone]   Wyn Forster 04/02/2019, 3:40 PM

## 2019-04-03 LAB — GLUCOSE, CAPILLARY
Glucose-Capillary: 222 mg/dL — ABNORMAL HIGH (ref 70–99)
Glucose-Capillary: 195 mg/dL — ABNORMAL HIGH (ref 70–99)
Glucose-Capillary: 115 mg/dL — ABNORMAL HIGH (ref 70–99)
Glucose-Capillary: 254 mg/dL — ABNORMAL HIGH (ref 70–99)

## 2019-04-03 NOTE — Progress Notes (Signed)
   Subjective: 2 Days Post-Op Procedure(s) (LRB): REVISION LEFT BELOW KNEE AMPUTATION (Left) Application Of Wound Vac Patient reports pain as  Bad yet falls asleep when I am talking to her. Closes her eyes while talking , falls asleep.   Objective: Vital signs in last 24 hours: Temp:  [97.6 F (36.4 C)-98 F (36.7 C)] 97.8 F (36.6 C) (04/12 0407) Pulse Rate:  [62-75] 68 (04/12 0823) Resp:  [14-18] 18 (04/12 0823) BP: (117-140)/(57-72) 124/60 (04/12 0407) SpO2:  [84 %-98 %] 91 % (04/12 0823)  Intake/Output from previous day: 04/11 0701 - 04/12 0700 In: 1080 [P.O.:1080] Out: 50 [Drains:50] Intake/Output this shift: Total I/O In: 360 [P.O.:360] Out: -   Recent Labs    04/01/19 0621  HGB 13.8   Recent Labs    04/01/19 0621  WBC 12.5*  RBC 4.42  HCT 43.8  PLT 139*   Recent Labs    04/01/19 0621  NA 137  K 4.2  CL 101  CO2 23  BUN 14  CREATININE 1.23*  GLUCOSE 255*  CALCIUM 9.3   No results for input(s): LABPT, INR in the last 72 hours.  VAC intact No results found.  Assessment/Plan: 2 Days Post-Op Procedure(s) (LRB): REVISION LEFT BELOW KNEE AMPUTATION (Left) Application Of Wound Vac Plan ; possible rehab.   Jennifer [name] 04/03/2019, 11:05 AM

## 2019-04-04 LAB — GLUCOSE, CAPILLARY
Glucose-Capillary: 162 mg/dL — ABNORMAL HIGH (ref 70–99)
Glucose-Capillary: 120 mg/dL — ABNORMAL HIGH (ref 70–99)
Glucose-Capillary: 108 mg/dL — ABNORMAL HIGH (ref 70–99)
Glucose-Capillary: 96 mg/dL (ref 70–99)

## 2019-04-04 LAB — CBC
HCT: 36.2 % (ref 36.0–46.0)
Hemoglobin: 11 g/dL — ABNORMAL LOW (ref 12.0–15.0)
MCH: 30.8 pg (ref 26.0–34.0)
MCHC: 30.4 g/dL (ref 30.0–36.0)
MCV: 101.4 fL — ABNORMAL HIGH (ref 80.0–100.0)
Platelets: 142 10*3/uL — ABNORMAL LOW (ref 150–400)
RBC: 3.57 MIL/uL — ABNORMAL LOW (ref 3.87–5.11)
RDW: 13.9 % (ref 11.5–15.5)
WBC: 14.7 10*3/uL — ABNORMAL HIGH (ref 4.0–10.5)
nRBC: 0 % (ref 0.0–0.2)

## 2019-04-04 LAB — BASIC METABOLIC PANEL
Anion gap: 7 (ref 5–15)
BUN: 25 mg/dL — ABNORMAL HIGH (ref 6–20)
CO2: 29 mmol/L (ref 22–32)
Calcium: 8.7 mg/dL — ABNORMAL LOW (ref 8.9–10.3)
Chloride: 101 mmol/L (ref 98–111)
Creatinine, Ser: 1.27 mg/dL — ABNORMAL HIGH (ref 0.44–1.00)
GFR calc Af Amer: 53 mL/min — ABNORMAL LOW (ref >60)
GFR calc non Af Amer: 46 mL/min — ABNORMAL LOW (ref >60)
Glucose, Bld: 109 mg/dL — ABNORMAL HIGH (ref 70–99)
Potassium: 4.6 mmol/L (ref 3.5–5.1)
Sodium: 137 mmol/L (ref 135–145)

## 2019-04-04 NOTE — Significant Event (Signed)
Rapid Response Event Note  Overview: Neurologic - Weakness in Arms/Hands  Initial Focused Assessment: Called by nurse about patient having bilateral upper extremity weakness and slurred speech. LSN is unknown but upon reviewing the chart per PT/OT and patient, weakness is ongoing and overall worsened. Patient was alert, oriented x 3, at times had some delayed responses but still oriented. No extremity weakness, no sensory loss, no visual loss, able to converse and no neglect. Patient endorses that her voice is different, she states that her voice is hoarse, it does not seem slurred on my examination of the patient. NIH - 0. Patient does have twitching and generalized weakness (no drift) in arms which per patient is normal for her, patient stated she has muscles spasms. Not in acute distress, VSS, blood sugar was normal  Interventions: - STAT CBC/BMET  Plan of Care: - RN paged MD, MD called back at 1742. I updated the MD on the patient's condition. Patient does not have focal neurologic deficits. I described the "twitching" to the MD as well. Plan - follow with labs for now. If labs are abnormal, call MD back. If symptoms worsen or stroke like symptoms, inform MD and call RRT as needed.  - RNs need to a bedside sign out and assessment so that RNs have a baseline for the patient. Staff made aware.   Event Summary:  Call Time [place] 7673 MD Returned Call [place]  Janai Brannigan R

## 2019-04-04 NOTE — Progress Notes (Signed)
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 PMH including but not limited to DM, COPD, HTN, PVD, Fibromyalgia, CHF.   OT comments  Pt very lethargic and hallucinating on therapist arrival. Pt attempting to use call bell as phone and speaking to people not in the room. Strongly recommend d/c SNF at this time. Session limited due to patients arousal and ability to follow commands appropriately. OT will continue to follow acutely.   Follow Up Recommendations  SNF    Equipment Recommendations  3 in 1 bedside commode;Other (comment)    Recommendations for Other Services      Precautions / Restrictions Precautions Precautions: Fall Precaution Comments: wound VAC Restrictions LLE Weight Bearing: Non weight bearing       Mobility Bed Mobility Overal bed mobility: Needs Assistance Bed Mobility: Supine to Sit       Sit to supine: Supervision   General bed mobility comments: be positioned to help patient support head on pillow appropriately. pt able to self adjust to midline in the bed with bed rails  Transfers Overall transfer level: Needs assistance Equipment used: Rolling walker (2 wheeled) Transfers: Stand Pivot Transfers;Sit to/from Stand Sit to Stand: Mod assist Stand pivot transfers: Mod assist       General transfer comment: unsafe to attempt at this time    Balance Overall balance assessment: Needs assistance Sitting-balance support: No upper extremity supported Sitting balance-Leahy Scale: Fair     Standing balance support: Bilateral upper extremity supported Standing balance-Leahy Scale: Poor Standing balance comment: UE support and assist for standing balance limited to about 15 seconds prior to fatigue and needing to sit; attempted perineal hygiene on her own, but redirected to  keep both hands on walker and allow staff to assist her due to balance, weakness                           ADL either performed or assessed with clinical judgement   ADL Overall ADL's : Needs assistance/impaired                                       General ADL Comments: pt was too cognitively impaired to continue past bed level cognitive related task. pt attempting to place phone on table and slamming it down on table without control. pt startles at her own motor control of phone.Pt turning volume back on when therapist turns volume off. pt unable to focus to use phone appropriately.       Vision       Perception     Praxis      Cognition Arousal/Alertness: Awake/alert Behavior During Therapy: Flat affect Overall Cognitive Status: Impaired/Different from baseline Area of Impairment: Safety/judgement;Attention;Problem solving;Memory                   Current Attention Level: Focused Memory: Decreased short-term memory   Safety/Judgement: Decreased awareness of safety   Problem Solving: Slow processing General Comments: pt answering call bell as a phone and does not show awareness even with cues. pt talking to people not present and when asked "whom are you talking to ?" sits up and says them. Pt informed no one present  Exercises     Shoulder Instructions       General Comments      Pertinent Vitals/ Pain       Pain Assessment: 0-10 Pain Score: 8  Pain Location: L leg Pain Descriptors / Indicators: Aching;Operative site guarding Pain Intervention(s): Monitored during session;Repositioned(poor ability )  Home Living                                          Prior Functioning/Environment              Frequency  Min 2X/week        Progress Toward Goals  OT Goals(current goals can now be found in the care plan section)  Progress towards OT goals: Progressing toward goals  Acute Rehab OT  Goals Patient Stated Goal: get back to her PLOF OT Goal Formulation: With patient Time For Goal Achievement: 04/16/19 Potential to Achieve Goals: Good ADL Goals Pt Will Perform Grooming: with modified independence Pt Will Perform Lower Body Dressing: with modified independence Pt Will Transfer to Toilet: with modified independence;ambulating Pt Will Perform Toileting - Clothing Manipulation and hygiene: with modified independence;sit to/from stand  Plan Discharge plan needs to be updated    Co-evaluation                 AM-PAC OT "6 Clicks" Daily Activity     Outcome Measure   Help from another person eating meals?: None Help from another person taking care of personal grooming?: A Little Help from another person toileting, which includes using toliet, bedpan, or urinal?: A Lot Help from another person bathing (including washing, rinsing, drying)?: A Lot Help from another person to put on and taking off regular upper body clothing?: A Little Help from another person to put on and taking off regular lower body clothing?: A Lot 6 Click Score: 16    End of Session    OT Visit Diagnosis: Unsteadiness on feet (R26.81);Other abnormalities of gait and mobility (R26.89);Muscle weakness (generalized) (M62.81);Pain Pain - Right/Left: Left Pain - part of body: Leg   Activity Tolerance Patient limited by lethargy   Patient Left in bed;with call bell/phone within reach;with chair alarm set   Nurse Communication Mobility status;Precautions;Weight bearing status        Time: 0242-0254 OT Time Calculation (min): 12 min  Charges: OT General Charges $OT Visit: 1 Visit OT Treatments $Cognitive Funtion inital: Initial 15 mins   Jeri Modena, OTR/L  Acute Rehabilitation Services Pager: [phone] Office: [phone] .    Jeri Modena 04/04/2019, 3:14 PM

## 2019-04-04 NOTE — Progress Notes (Signed)
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 PMH including but not limited to DM, COPD, HTN, PVD, Fibromyalgia, CHF.    PT Comments    Patient progressing slowly in fact more assist needed today for transfer, but able to perform stand pivots with walker (though unsafe due to weakness, fatigue).  Patient eager to participate and continues to be appropriate for inpatient rehab prior to d/c home.  PT to follow acutely.3   Follow Up Recommendations  CIR     Equipment Recommendations  None recommended by PT    Recommendations for Other Services       Precautions / Restrictions Precautions Precautions: Fall Precaution Comments: wound VAC Restrictions LLE Weight Bearing: Non weight bearing    Mobility  Bed Mobility Overal bed mobility: Needs Assistance Bed Mobility: Supine to Sit       Sit to supine: Supervision   General bed mobility comments: assist and cues for safety and positioning.   Transfers Overall transfer level: Needs assistance Equipment used: Rolling walker (2 wheeled) Transfers: Stand Pivot Transfers;Sit to/from Stand Sit to Stand: Mod assist Stand pivot transfers: Mod assist       General transfer comment: lifting assist from recliner, assist for hop step with RW to [facility] then to recliner, then to bed, unsafe techinque sitting on arm of recliner getting back from [facility].   Ambulation/Gait                 Stairs             Wheelchair Mobility    Modified Rankin (Stroke Patients Only)       Balance Overall balance assessment: Needs assistance Sitting-balance support: No upper extremity supported Sitting balance-Leahy Scale: Fair     Standing balance support: Bilateral upper extremity supported Standing balance-Leahy Scale: Poor Standing balance comment: UE  support and assist for standing balance limited to about 15 seconds prior to fatigue and needing to sit; attempted perineal hygiene on her own, but redirected to keep both hands on walker and allow staff to assist her due to balance, weakness                            Cognition Arousal/Alertness: Awake/alert Behavior During Therapy: WFL for tasks assessed/performed Overall Cognitive Status: No family/caregiver present to determine baseline cognitive functioning Area of Impairment: Safety/judgement;Attention;Problem solving;Memory                   Current Attention Level: Sustained Memory: Decreased short-term memory   Safety/Judgement: Decreased awareness of safety   Problem Solving: Slow processing General Comments: unsafe with reaching to floor, eager to wear prosthetic, but reminded about NWB, slow to process at times and dropping things throughout session (reports this is not new, however)      Exercises      General Comments        Pertinent Vitals/Pain Pain Score: 8  Pain Location: L leg Pain Descriptors / Indicators: Aching;Operative site guarding Pain Intervention(s): Monitored during session;Repositioned    Home Living                      Prior Function            PT Goals (current goals can now be found in the care  plan section) Progress towards PT goals: Progressing toward goals(slowly)    Frequency    Min 4X/week      PT Plan Current plan remains appropriate    Co-evaluation              AM-PAC PT "6 Clicks" Mobility   Outcome Measure  Help needed turning from your back to your side while in a flat bed without using bedrails?: A Little Help needed moving from lying on your back to sitting on the side of a flat bed without using bedrails?: A Little Help needed moving to and from a bed to a chair (including a wheelchair)?: A Little Help needed standing up from a chair using your arms (e.g., wheelchair or bedside  chair)?: A Little Help needed to walk in hospital room?: Total Help needed climbing 3-5 steps with a railing? : Total 6 Click Score: 14    End of Session Equipment Utilized During Treatment: Gait belt Activity Tolerance: Patient limited by fatigue Patient left: in bed;with call bell/phone within reach;with bed alarm set   PT Visit Diagnosis: Other abnormalities of gait and mobility (R26.89);Muscle weakness (generalized) (M62.81);Pain Pain - Right/Left: Left Pain - part of body: Leg     Time: 3888-2800 PT Time Calculation (min) (ACUTE ONLY): 47 min  Charges:  $Therapeutic Activity: 38-52 mins                     [name], [place] Acute Rehabilitation Services [phone] 04/04/2019    [name] 04/04/2019, 12:16 PM

## 2019-04-04 NOTE — Progress Notes (Signed)
CSW consulted with patient who sounded groggy on phone, was given permission to reach out to sons. CSW spoke with son [name] who reports patient has been to SNF in the past and does not want to return. CSW encouraged [name] that patient is currently requiring assistance for mobility, he reports she lives with brothers, aunts, and cousins who are all able to assist in patient care.   [name] stated he will speak with patient and asked CSW to follow up with him tomorrow morning regarding family's decision of home with [facility] vs. SNF.  [place], [place]

## 2019-04-04 NOTE — Plan of Care (Signed)

## 2019-04-04 NOTE — Progress Notes (Signed)
[name], female   DOB: 1961/05/29, 58 y.o.   MRN: [id number] Patient is postoperative day 3 revision left transtibial amputation.  Patient states she has been having pain.  There is 175 cc in the wound VAC canister this was 150 cc on Saturday.  Patient is showing good progress possible discharge to inpatient rehab.  Patient states she is not interested in outpatient rehab.

## 2019-04-04 NOTE — Progress Notes (Signed)
Inpatient Diabetes Program Recommendations  AACE/ADA: New Consensus Statement on Inpatient Glycemic Control   Target Ranges:  Prepandial:   less than 140 mg/dL      Peak postprandial:   less than 180 mg/dL (1-2 hours)      Critically ill patients:  140 - 180 mg/dL   Results for Jennifer [name], Jennifer [name] (MRN [id number]) as of 04/04/2019 11:34  Ref. Range 04/03/2019 07:51 04/03/2019 12:01 04/03/2019 16:23 04/03/2019 20:16 04/04/2019 08:12  Glucose-Capillary Latest Ref Range: 70 - 99 mg/dL 222 (H) 195 (H) 115 (H) 254 (H) 162 (H)   Review of Glycemic Control  Current orders for Inpatient glycemic control: Lantus 70 units daily, Novolog 0-15 units TID, Novolog 0-5 units QHS, Metformin XR 500 mg QAM  Inpatient Diabetes Program Recommendations:   Insulin - Meal Coverage: Please consider ordering Novolog 3 units TID with meals if patient eats at least 50% of meals.  Thanks, Barnie Alderman, RN, MSN, CDE Diabetes Coordinator Inpatient Diabetes Program [phone] (Team Pager from 8am to 5pm)

## 2019-04-04 NOTE — Progress Notes (Addendum)
Inpatient Rehabilitation Admissions Coordinator  Inpatient Rehab Consult received. I met with patient at the bedside for rehabilitation assessment. I explained that I do not have a bed available to admit her to today and she is aware that I am recommending SNF rehab at this time. She has been living in the living room of her niece's home since February. Was living in a hotel before that. I have alerted RN CM, Manuela Schwartz, of need for SNF.  Danne Baxter, RN, MSN Rehab Admissions Coordinator [phone] 04/04/2019 12:00 PM

## 2019-04-04 NOTE — Progress Notes (Signed)
Reported received from RN. Patient resting with eyes closed. No respiratory distress. Will continue to monitor.

## 2019-04-04 NOTE — Progress Notes (Signed)
RT found patient on room air, sat 79%.  RT placed patient back on 2L [place], sat improved to 93%.

## 2019-04-05 LAB — COMPREHENSIVE METABOLIC PANEL
ALT: 19 U/L (ref 0–44)
AST: 25 U/L (ref 15–41)
Albumin: 2.6 g/dL — ABNORMAL LOW (ref 3.5–5.0)
Alkaline Phosphatase: 62 U/L (ref 38–126)
Anion gap: 10 (ref 5–15)
BUN: 22 mg/dL — ABNORMAL HIGH (ref 6–20)
CO2: 26 mmol/L (ref 22–32)
Calcium: 8.8 mg/dL — ABNORMAL LOW (ref 8.9–10.3)
Chloride: 102 mmol/L (ref 98–111)
Creatinine, Ser: 1.19 mg/dL — ABNORMAL HIGH (ref 0.44–1.00)
GFR calc Af Amer: 57 mL/min — ABNORMAL LOW (ref >60)
GFR calc non Af Amer: 50 mL/min — ABNORMAL LOW (ref >60)
Glucose, Bld: 170 mg/dL — ABNORMAL HIGH (ref 70–99)
Potassium: 4.8 mmol/L (ref 3.5–5.1)
Sodium: 138 mmol/L (ref 135–145)
Total Bilirubin: 0.4 mg/dL (ref 0.3–1.2)
Total Protein: 5.7 g/dL — ABNORMAL LOW (ref 6.5–8.1)

## 2019-04-05 LAB — PHOSPHORUS: Phosphorus: 3.5 mg/dL (ref 2.5–4.6)

## 2019-04-05 LAB — GLUCOSE, CAPILLARY
Glucose-Capillary: 116 mg/dL — ABNORMAL HIGH (ref 70–99)
Glucose-Capillary: 142 mg/dL — ABNORMAL HIGH (ref 70–99)
Glucose-Capillary: 148 mg/dL — ABNORMAL HIGH (ref 70–99)
Glucose-Capillary: 144 mg/dL — ABNORMAL HIGH (ref 70–99)

## 2019-04-05 LAB — CK: Total CK: 83 U/L (ref 38–234)

## 2019-04-05 LAB — TSH: TSH: 0.135 u[IU]/mL — ABNORMAL LOW (ref 0.350–4.500)

## 2019-04-05 NOTE — Consult Note (Addendum)
Date: 04/05/2019               Patient Name:  Jennifer [name] MRN: [id number]  DOB: 28-Oct-1961 Age / Sex: 58 y.o., female   PCP: Bartholomew Crews, MD         Requesting Physician: Dr. Newt Minion, MD    Consulting Reason:  Slurred speech and weakness     Chief Complaint: Slurred speech  History of Present Illness: Jennifer [name] is a 58 yo F with a PMHx of DMII, HLD, MDD, HTN, COPD, HFpEF, BKA, and intermittent muscle spasms s/p revision of her left BKA 2/2 to wound dehiscence. As per nursing staff report, she was improving with PT until the prior day when she required the assistance of 3 to ambulate and additionally has had upper extremity muscle spasms. In addition, the patient was reportedly difficult to understand over the phone when her family spoke with her. They felt that she sounded unusual and were concerned for a stroke. She was evaluated by the orthopedic attending and rapid response team who felt a stroke was unlikely. The patient states that she is experiencing sudden onset weakness with minimal exertion that then resolves with return of strength. In addition she feels generally tired and unable to sit up or walk. She is nonambulatory at baseline. She states she has a history of intermittent muscle spasms that have been occurring off and on for years but is unsure exactly how long ago this started. She does feel like they are increasing in frequency but is not sure how often they are occurring. She denies fever, chills, myalgias, chest pain, SOB, abdominal pain or acute visual changes.  She denies acute loss of strength of either hand or her right leg. She denies numbness, changes in urination but does endorse constipation.  She states her mother additionally had a history of muscle spasms that increased as she aged but is not sure what the disorder was called and will try to ask her father.   Meds: Current Facility-Administered Medications  Medication Dose Route Frequency Provider  Last Rate Last Dose  . acetaminophen (TYLENOL) tablet 325-650 mg  325-650 mg Oral Q6H PRN Rayburn, Neta Mends, PA-C      . albuterol (PROVENTIL) (2.5 MG/3ML) 0.083% nebulizer solution 2.5 mg  2.5 mg Nebulization Q6H PRN Rayburn, Neta Mends, PA-C      . amLODipine (NORVASC) tablet 10 mg  10 mg Oral Daily Newt Minion, MD   10 mg at 04/04/19 1353   And  . benazepril (LOTENSIN) tablet 40 mg  40 mg Oral Daily Newt Minion, MD   40 mg at 04/04/19 1353  . atenolol (TENORMIN) tablet 100 mg  100 mg Oral Daily Rayburn, Neta Mends, PA-C   100 mg at 04/04/19 1125  . buPROPion (WELLBUTRIN XL) 24 hr tablet 300 mg  300 mg Oral Daily Rayburn, Neta Mends, PA-C   300 mg at 04/05/19 1035  . cholecalciferol (VITAMIN D3) tablet 1,000 Units  1,000 Units Oral Daily Rayburn, Neta Mends, PA-C   1,000 Units at 04/05/19 1036  . docusate sodium (COLACE) capsule 100 mg  100 mg Oral BID Rayburn, Neta Mends, PA-C   100 mg at 04/05/19 1038  . DULoxetine (CYMBALTA) DR capsule 30 mg  30 mg Oral Daily Rayburn, Neta Mends, PA-C   30 mg at 04/05/19 1039  . fluconazole (DIFLUCAN) tablet 100 mg  100 mg Oral Daily Rayburn, Neta Mends, PA-C   100 mg at 04/05/19 1038  .  fluticasone (FLONASE) 50 MCG/ACT nasal spray 1 spray  1 spray Each Nare Daily Rayburn, Neta Mends, PA-C   1 spray at 04/05/19 1043  . hydrochlorothiazide (HYDRODIURIL) tablet 25 mg  25 mg Oral Daily Rayburn, Neta Mends, PA-C   25 mg at 04/04/19 1124  . HYDROmorphone (DILAUDID) injection 0.5-1 mg  0.5-1 mg Intravenous Q4H PRN Rayburn, Neta Mends, PA-C   1 mg at 04/03/19 1506  . hydrOXYzine (ATARAX/VISTARIL) tablet 25 mg  25 mg Oral TID PRN Rayburn, Neta Mends, PA-C      . insulin aspart (novoLOG) injection 0-15 Units  0-15 Units Subcutaneous TID WC Marybelle Killings, MD   2 Units at 04/05/19 1246  . insulin aspart (novoLOG) injection 0-5 Units  0-5 Units Subcutaneous QHS Marybelle Killings, MD   2 Units at  04/03/19 2155  . insulin glargine (LANTUS) injection 70 Units  70 Units Subcutaneous Daily Newt Minion, MD   70 Units at 04/05/19 1033  . ipratropium (ATROVENT) nebulizer solution 0.5 mg  0.5 mg Nebulization Q6H PRN Rayburn, Neta Mends, PA-C      . metFORMIN (GLUCOPHAGE-XR) 24 hr tablet 500 mg  500 mg Oral Q breakfast Rayburn, Neta Mends, PA-C   500 mg at 04/05/19 1038  . methocarbamol (ROBAXIN) 500 mg in dextrose 5 % 50 mL IVPB  500 mg Intravenous Q6H PRN Newt Minion, MD 100 mL/hr at 04/05/19 1457 500 mg at 04/05/19 1457  . metoCLOPramide (REGLAN) tablet 5-10 mg  5-10 mg Oral Q8H PRN Rayburn, Neta Mends, PA-C       Or  . metoCLOPramide (REGLAN) injection 5-10 mg  5-10 mg Intravenous Q8H PRN Rayburn, Neta Mends, PA-C      . mirabegron ER (MYRBETRIQ) tablet 50 mg  50 mg Oral Daily Rayburn, Neta Mends, PA-C   50 mg at 04/05/19 1037  . multivitamin with minerals tablet 1 tablet  1 tablet Oral Daily Rayburn, Neta Mends, PA-C   1 tablet at 04/05/19 1036  . ondansetron (ZOFRAN) tablet 4 mg  4 mg Oral Q6H PRN Rayburn, Neta Mends, PA-C       Or  . ondansetron (ZOFRAN) injection 4 mg  4 mg Intravenous Q6H PRN Rayburn, Neta Mends, PA-C   4 mg at 04/03/19 1506  . oxyCODONE (Oxy IR/ROXICODONE) immediate release tablet 10-15 mg  10-15 mg Oral Q4H PRN Rayburn, Neta Mends, PA-C   15 mg at 04/03/19 0718  . oxyCODONE (Oxy IR/ROXICODONE) immediate release tablet 5-10 mg  5-10 mg Oral Q4H PRN Rayburn, Neta Mends, PA-C   10 mg at 04/04/19 0528  . pantoprazole (PROTONIX) EC tablet 40 mg  40 mg Oral Daily Rayburn, Neta Mends, PA-C   40 mg at 04/05/19 1040  . pregabalin (LYRICA) capsule 200 mg  200 mg Oral TID Rayburn, Neta Mends, PA-C   200 mg at 04/05/19 1453  . rosuvastatin (CRESTOR) tablet 20 mg  20 mg Oral QHS Rayburn, Neta Mends, PA-C   20 mg at 04/04/19 2207  . traZODone (DESYREL) tablet 200 mg  200 mg Oral QHS Marybelle Killings, MD    Stopped at 04/04/19 2207  . umeclidinium-vilanterol (ANORO ELLIPTA) 62.5-25 MCG/INH 1 puff  1 puff Inhalation Daily Rayburn, Neta Mends, PA-C   1 puff at 04/05/19 1042  . vitamin B-12 (CYANOCOBALAMIN) tablet 100 mcg  100 mcg Oral Daily Rayburn, Neta Mends, PA-C   100 mcg at 04/05/19 1040  . vitamin C (ASCORBIC ACID) tablet 500 mg  500 mg Oral Daily Rayburn,  Neta Mends, PA-C   500 mg at 04/05/19 1041    Allergies: Allergies as of 03/28/2019 - Review Complete 03/28/2019  Allergen Reaction Noted  . Abilify [aripiprazole] Other (See Comments) 11/09/2015  . Iohexol  08/17/2007  . Ivp dye [iodinated diagnostic agents]  05/10/2013  . Morphine sulfate Itching and Rash    Past Medical History:  Diagnosis Date  . Anginal pain [facility])    '3' of 10 ischemia ruled out 9/9   . Arthritis of lumbar spine   . Asthma   . Cataract   . CHF (congestive heart failure) ([place])   . 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 Chronic bronchitis ([place])    "I get it alot" (09/28/2013)  . Chronic diastolic heart failure (HCC)    grade 2 per 2D echocardiogram (01/2013)  . Chronic lower back pain   . Chronic osteomyelitis of foot (HCC)    chronic, right secondary to diabetic foot ulcers  . Chronic pain syndrome 12/03/2011   Likely secondary to depression, "fibromyalgia", neuropathy, and obesity. Lumbar MRI 2014 no sig change from prior (2008) : Stable hypertrophic facet disease most notable at L4-5. Stable shallow left foraminal/extraforaminal disc protrusion at L4-5. No direct neural compression.      Marland Kitchen COPD 01/08/2007   PFT's 05/2007 : FEV1/FVC 82, FEV1 64% pred, FEF 25-75% 40% predicted, 16% improvement in FEV1 with bronchodilators.     . Depression   . Diabetes mellitus without complication ([place])    Type II  . Diabetic peripheral neuropathy ([place])   . DVT of upper extremity (deep vein thrombosis) ([place]) 03/11/2013   Secondary to PICC line. Right brachial vein, diagnosed on 03/10/2013  Coumadin for 3 months. End date 06/10/2013   . Dyspnea    "smoker"  . Environmental allergies    Hx: of  . Exertional shortness of breath   . Fatty liver 2003   observed on ultrasound abdomen  . Fibromyalgia   . GERD (gastroesophageal reflux disease)   . Glaucoma   . History of use of hearing aid   . Hyperlipidemia   . Hyperplastic colon polyp 12/2010   Per colonoscopy (12/2010) - Dr. Deatra Ina  . Hypertension   . 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 Blood cultures 01/26/13 S. Aureus and GBS. Blood cultures Feb 6th, 8th, and 9th and March were negative.Repeat TEE 3/20 negative for vegitations  . 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 2/2 tobacco use, DM, and cont weight bearing on surgical wound and developed gangrene   . Pneumonia   . Polymicrobial bacterial infection 01/2013   GBS and S. aureus bacteremia // Source likely infected diabetic foot ulcer  . PVD (peripheral vascular disease) with claudication ([place])    Stents to bilateral common iliac arteries (left 2005, right 2008), on chronic plavix  . Rheumatoid arthritis ([place])   . Tobacco abuse   . Type II diabetes mellitus with peripheral circulatory disorders, uncontrolled DX: 1993   Insulin dep. Poor control. Complicated by diabetic foot ulcer and diabetic eye disease.    Marland Kitchen Ulcer of foot, chronic (HCC)    Left. No OM per MRI (01/2013)   Past Surgical History:  Procedure Laterality Date  . ABDOMINAL HYSTERECTOMY  1997   secondary to uterine fibroids  . AMPUTATION Left 08/31/2013   Procedure: AMPUTATION RAY;  Surgeon: Newt Minion, MD;  Location: [facility]  OR;  Service: Orthopedics;  Laterality: Left;  Left Foot 5th Ray Amputation  . AMPUTATION Left 09/28/2013   Procedure: Left Midfoot amputation;  Surgeon: Newt Minion, MD;  Location: [place];  Service: Orthopedics;  Laterality: Left;  Left Midfoot amputation   . AMPUTATION Left 10/14/2013   Procedure: AMPUTATION BELOW KNEE- left;  Surgeon: Newt Minion, MD;  Location: [place];  Service: Orthopedics;  Laterality: Left;  Left Below Knee Amputation   . AMPUTATION TOE Right 01/15/2017   Procedure: AMPUTATION 5th TOE RIGHT FOOT;  Surgeon: Edrick Kins, DPM;  Location: [place];  Service: Podiatry;  Laterality: Right;  . APPLICATION OF WOUND VAC  04/01/2019   Procedure: Application Of Wound Vac;  Surgeon: Newt Minion, MD;  Location: [place];  Service: Orthopedics;;  . BLADDER SURGERY     bladder reconstruction surgery  . BREAST BIOPSY     multiple-benign per pt  . COLONOSCOPY    . ESOPHAGOGASTRODUODENOSCOPY N/A 09/20/2013   Procedure: ESOPHAGOGASTRODUODENOSCOPY (EGD);  Surgeon: Jerene Bears, MD;  Location: [place];  Service: Gastroenterology;  Laterality: N/A;  . FOOT AMPUTATION THROUGH METATARSAL Left 09/28/2013  . GANGLION CYST EXCISION     multiple  . PERIPHERAL VASCULAR INTERVENTION     stents in lower ext  . SHOULDER ARTHROSCOPY W/ ROTATOR CUFF REPAIR Bilateral   . SKIN SPLIT GRAFT Bilateral 05/13/2013   Procedure: Right and Left Foot Allograft Skin Graft;  Surgeon: Newt Minion, MD;  Location: [place];  Service: Orthopedics;  Laterality: Bilateral;  Right and Left Foot Allograft Skin Graft  . STUMP REVISION Left 04/01/2019   Procedure: REVISION LEFT BELOW KNEE AMPUTATION;  Surgeon: Newt Minion, MD;  Location: [place];  Service: Orthopedics;  Laterality: Left;  . TEE WITHOUT CARDIOVERSION N/A 01/31/2013   Procedure: TRANSESOPHAGEAL ECHOCARDIOGRAM (TEE);  Surgeon: Fay Records, MD;  Location: [place];  Service: Cardiovascular;  Laterality: N/A;  Rm [phone]  . TEE WITHOUT CARDIOVERSION N/A 03/10/2013   Procedure: TRANSESOPHAGEAL ECHOCARDIOGRAM (TEE);  Surgeon: Larey Dresser, MD;  Location: [place];  Service: Cardiovascular;  Laterality: N/A;  Rm. [id number]  . TOE AMPUTATION Left 08/31/2013   4TH & 5 TH TOE   . TONSILLECTOMY    . TUBAL LIGATION     . WRIST SURGERY Right    "for tumors" (09/28/2013)   Family History  Problem Relation Age of Onset  . Diverticulosis Mother   . Diabetes Mother   . Hypertension Mother   . Congestive Heart Failure Mother   . Asthma Father   . CAD Sister 57       MI at age 56 per patient.  However, she has not had a stent or CABG.   . Heart disease Sister        before age 22  . Breast cancer Neg Hx    Social History   Socioeconomic History  . Marital status: Divorced    Spouse name: Not on file  . Number of children: 2  . Years of education: college  . Highest education level: Not on file  Occupational History  . Occupation: Disability    Comment: previously worked as a Civil engineer, contracting  . Financial resource strain: Very hard  . Food insecurity:    Worry: Often true    Inability: Often true  . Transportation needs:    Medical: No    Non-medical: Yes  Tobacco Use  . Smoking status: Current Every Day Smoker  Packs/day: 1.00    Years: 50.00    Pack years: 50.00    Types: Cigarettes  . Smokeless tobacco: Never Used  Substance and Sexual Activity  . Alcohol use: No    Alcohol/week: 0.0 standard drinks  . Drug use: No    Types: Marijuana, "Crack" cocaine    Comment: 09/28/2013 "no marijuana since April 09, 2010, no crack/cocaine 1989"  . Sexual activity: Not Currently  Lifestyle  . Physical activity:    Days per week: Not on file    Minutes per session: Not on file  . Stress: Not on file  Relationships  . Social connections:    Talks on phone: Not on file    Gets together: Not on file    Attends religious service: Not on file    Active member of club or organization: Not on file    Attends meetings of clubs or organizations: Not on file    Relationship status: Not on file  . Intimate partner violence:    Fear of current or ex partner: Not on file    Emotionally abused: Not on file    Physically abused: Not on file    Forced sexual activity: Not on file  Other Topics Concern  .  Not on file  Social History Narrative   On disability. Lives with son in [place]. Formerly worked as Training and development officer.    Boyfriend passed away stage 4 cancer 09-Apr-2013.   S/p L BKA 04-09-2013. In wheelchair in paritially suitable apartment.     Review of Systems: Pertinent items noted in HPI and remainder of comprehensive ROS otherwise negative.  Physical Exam: Blood pressure (!) 114/55, pulse 75, temperature 98.6 F (37 C), temperature source Oral, resp. rate 17, height 5\' 7"  (1.702 m), weight 116.6 kg, SpO2 100 %.  General: A/O x4, in no acute distress, afebrile, nondiaphoretic, appears tired HEENT: PEERL, EMO intact Cardio: RRR, no mrg's  Pulmonary: CTA bilaterally, no wheezing or crackles  Abdomen: Bowel sounds normal, soft, nontender  MSK: RLE nontender, nonedematous, LLE recent BKA with dressings and wound vac, intermittent muscle fasciculations area of right shoulder girdle/arm Neuro: fully alert, some slurred speech, CNII-XII grossly intact, conversational, strength 5/5 in the upper and right lower extremity with sudden loss of strength and dropping of arm or body, able to support head with some jerking, finger to nose is intact but limited due to sudden loss of strength with attempting outstretched arms Psych: Appropriate affect, not depressed in appearance, engages well  Lab results: CMP Latest Ref Rng & Units 04/04/2019 04/01/2019 03/25/2019  Glucose 70 - 99 mg/dL 109(H) 255(H) 347(H)  BUN 6 - 20 mg/dL 25(H) 14 15  Creatinine 0.44 - 1.00 mg/dL 1.27(H) 1.23(H) 0.99  Sodium 135 - 145 mmol/L 137 137 136  Potassium 3.5 - 5.1 mmol/L 4.6 4.2 4.5  Chloride 98 - 111 mmol/L 101 101 100  CO2 22 - 32 mmol/L 29 23 26   Calcium 8.9 - 10.3 mg/dL 8.7(L) 9.3 9.3  Total Protein 6.5 - 8.1 g/dL - - -  Total Bilirubin 0.3 - 1.2 mg/dL - - -  Alkaline Phos 38 - 126 U/L - - -  AST 15 - 41 U/L - - -  ALT 14 - 54 U/L - - -   CBC Latest Ref Rng & Units 04/04/2019 04/01/2019 01/18/2019  WBC 4.0 - 10.5 K/uL 14.7(H)  12.5(H) 9.4  Hemoglobin 12.0 - 15.0 g/dL 11.0(L) 13.8 13.4  Hematocrit 36.0 - 46.0 % 36.2 43.8 41.4  Platelets 150 -  400 K/uL 142(L) 139(L) 163   Imaging results:  No results found.  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 Due to concern for slurred speech and weakness the IMTS was consulted to evaluate the patient. There are not apparent focal neurological deficits noted on exam but the patient demonstrates easy fatigability with minimal exertion, exhibits repetitive yawning and slurring of speech.   Plan: Generalized weakness vs muscle spasm: No current sign of CVA. Will evaluate for rheumatological disease, thyroid dysfunction as well as metabolic disorder but no clear etiology at this time. Ca and K+ WNL's. If negative will consider further workup for neuromuscular dysfunction or MRI.  -TSH, T3, fT4 -CK -CMP -Phosphorus  HFpEF: No current indication of exacerbation. Fluid status appears stable with stable renal function. Agree with continuing atenolol 100mg  daily.    DMII: Glucose well controlled during admission. Agree with continuing SSI aspart and Lantus 70U daily. Caution continued Metformin use in hospital due to   HTN: Soft BP after admission. Would continue home antihypertensives while admitted. Hydrochlorothiazide 25mg  Amlodipine 10mg  Benazepril 40mg   COPD: Please keep supplemental O2 as low as necessary to maintain O2 sats within 88-92% due to COPD diagnosis. Agree with continuing home medications of Anoro Ellipta, and albuterol PRN Agree with ipratropium nebulizer   Diet: Carb modified Code: Full DVT PPX: SCD's Signed: Kathi Ludwig, MD 04/05/2019, 5:40 PM

## 2019-04-05 NOTE — Progress Notes (Addendum)
CSW lvm with patient son Jennifer [name] this morning to review SNF options as he was previously discussing with family yesterday and would give me their decision today. No call back yet today.   [place], [place]

## 2019-04-05 NOTE — NC FL2 (Signed)
[place] LEVEL OF CARE SCREENING TOOL     IDENTIFICATION  Patient Name: Jennifer [name] Birthdate: [birth year] Sex: female Admission Date (Current Location): 04/01/2019  [facility] and [place] Number:  Herbalist and Address:  [facility] [place], [place] [address] [place], [place] [address]      Provider Number: [id number]  Attending Physician Name and Address:  Newt Minion, MD  Relative Name and Phone Number:  [name] [phone]    Current Level of Care: Hospital Recommended Level of Care: [place] Prior Approval Number: [id number]  Date Approved/Denied: 10/17/13 PASRR Number:    Discharge Plan: SNF    Current Diagnoses: Patient Active Problem List   Diagnosis Date Noted  . 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 Dehiscence of amputation stump ([place])   . Oropharyngeal candidiasis 03/25/2019  . Cellulitis of left lower extremity 03/09/2019  . Vulvar cysts 01/27/2019  . Urinary incontinence 05/13/2018  . OSA (obstructive sleep apnea) 06/12/2017  . Morbid obesity with BMI of 40.0-44.9, adult ([place]) 03/26/2017  . Toe amputation status, right 01/16/2017  . Thrombocytopenia ([place]) 06/05/2016  . 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 Type 2 diabetes with nephropathy ([place]) 09/07/2015  . Uncontrolled diabetes mellitus with retinopathy, due to underlying condition, without macular edema ([place]) 09/05/2015  . Diabetic retinopathy ([place]) 09/05/2015  . Counseling regarding end of life decision making 06/14/2015  . Atherosclerosis of aorta ([place]) 04/04/2015  . Anemia 10/05/2014  . Chest pain 09/16/2013  . Chronic diastolic heart failure ([place])   . S/P BKA (below knee amputation) unilateral ([place])   . Tobacco abuse   . Severe obesity (BMI >= 40) ([place]) 03/02/2013  . Abnormality of gait 03/01/2013  . Healthcare maintenance 07/10/2012  . Chronic prescription opiate use 12/03/2011  . Secondary diabetes mellitus  with peripheral vascular disease ([place]) 08/27/2011  . 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 Hypertension associated with diabetes ([place]) 11/29/2009  . Chronic insomnia 10/25/2009  . GASTROESOPHAGEAL REFLUX DISEASE 11/24/2008  . Depression, major, severe recurrence ([place]) 04/06/2008  . DM (diabetes mellitus) type II uncontrolled, periph vascular disorder ([place]) 04/02/2007  . Hyperlipidemia associated with type 2 diabetes mellitus ([place]) 01/08/2007  . COPD (chronic obstructive pulmonary disease) ([place]) 01/08/2007    Orientation RESPIRATION BLADDER Height & Weight     Self(fluctuating oriented X1 to oriented x4)  O2(nasal cannual 2L/min) Incontinent, External catheter Weight: 257 lb (116.6 kg) Height:  5\' 7"  (170.2 cm)  BEHAVIORAL SYMPTOMS/MOOD NEUROLOGICAL BOWEL NUTRITION STATUS      Continent Diet(see discharge summary)  AMBULATORY STATUS COMMUNICATION OF NEEDS Skin   Extensive Assist Verbally Other (Comment), Surgical wounds, Wound Vac(left leg amputation (BKA), ecchymosis arms, negative pressure wound therapy left leg)                       Personal Care Assistance Level of Assistance  Bathing, Feeding, Dressing, Total care Bathing Assistance: Limited assistance Feeding assistance: Independent Dressing Assistance: Limited assistance Total Care Assistance: Maximum assistance   Functional Limitations Info  Sight, Hearing, Speech Sight Info: Impaired Hearing Info: Adequate Speech Info: Adequate    SPECIAL CARE FACTORS FREQUENCY  PT (By licensed PT), OT (By licensed OT)     PT Frequency: min 5x weekly OT Frequency: min 5x weekly            Contractures Contractures Info: Not present    Additional Factors Info  Code Status,  Allergies Code Status Info: full Allergies Info: Iohexol, Ivp Dye (iodinated diagnostic agents), Abilify (aaripiprazole), Morphine Sulfate           Current Medications (04/05/2019):  This is the current hospital active  medication list Current Facility-Administered Medications  Medication Dose Route Frequency Provider Last Rate Last Dose  . acetaminophen (TYLENOL) tablet 325-650 mg  325-650 mg Oral Q6H PRN Rayburn, Neta Mends, PA-C      . albuterol (PROVENTIL) (2.5 MG/3ML) 0.083% nebulizer solution 2.5 mg  2.5 mg Nebulization Q6H PRN Rayburn, Neta Mends, PA-C      . amLODipine (NORVASC) tablet 10 mg  10 mg Oral Daily Newt Minion, MD   10 mg at 04/04/19 1353   And  . benazepril (LOTENSIN) tablet 40 mg  40 mg Oral Daily Newt Minion, MD   40 mg at 04/04/19 1353  . atenolol (TENORMIN) tablet 100 mg  100 mg Oral Daily Rayburn, Neta Mends, PA-C   100 mg at 04/04/19 1125  . buPROPion (WELLBUTRIN XL) 24 hr tablet 300 mg  300 mg Oral Daily Rayburn, Neta Mends, PA-C   300 mg at 04/05/19 1035  . cholecalciferol (VITAMIN D3) tablet 1,000 Units  1,000 Units Oral Daily Rayburn, Neta Mends, PA-C   1,000 Units at 04/05/19 1036  . docusate sodium (COLACE) capsule 100 mg  100 mg Oral BID Rayburn, Neta Mends, PA-C   100 mg at 04/05/19 1038  . DULoxetine (CYMBALTA) DR capsule 30 mg  30 mg Oral Daily Rayburn, Neta Mends, PA-C   30 mg at 04/05/19 1039  . fluconazole (DIFLUCAN) tablet 100 mg  100 mg Oral Daily Rayburn, Neta Mends, PA-C   100 mg at 04/05/19 1038  . fluticasone (FLONASE) 50 MCG/ACT nasal spray 1 spray  1 spray Each Nare Daily Rayburn, Neta Mends, PA-C   1 spray at 04/05/19 1043  . hydrochlorothiazide (HYDRODIURIL) tablet 25 mg  25 mg Oral Daily Rayburn, Neta Mends, PA-C   25 mg at 04/04/19 1124  . HYDROmorphone (DILAUDID) injection 0.5-1 mg  0.5-1 mg Intravenous Q4H PRN Rayburn, Neta Mends, PA-C   1 mg at 04/03/19 1506  . hydrOXYzine (ATARAX/VISTARIL) tablet 25 mg  25 mg Oral TID PRN Rayburn, Neta Mends, PA-C      . insulin aspart (novoLOG) injection 0-15 Units  0-15 Units Subcutaneous TID WC Marybelle Killings, MD   3 Units at 04/04/19 0831  .  insulin aspart (novoLOG) injection 0-5 Units  0-5 Units Subcutaneous QHS Marybelle Killings, MD   2 Units at 04/03/19 2155  . insulin glargine (LANTUS) injection 70 Units  70 Units Subcutaneous Daily Newt Minion, MD   70 Units at 04/05/19 1033  . ipratropium (ATROVENT) nebulizer solution 0.5 mg  0.5 mg Nebulization Q6H PRN Rayburn, Neta Mends, PA-C      . metFORMIN (GLUCOPHAGE-XR) 24 hr tablet 500 mg  500 mg Oral Q breakfast Rayburn, Neta Mends, PA-C   500 mg at 04/05/19 1038  . methocarbamol (ROBAXIN) 500 mg in dextrose 5 % 50 mL IVPB  500 mg Intravenous Q6H PRN Newt Minion, MD 100 mL/hr at 04/01/19 1811 500 mg at 04/01/19 1811  . metoCLOPramide (REGLAN) tablet 5-10 mg  5-10 mg Oral Q8H PRN Rayburn, Neta Mends, PA-C       Or  . metoCLOPramide (REGLAN) injection 5-10 mg  5-10 mg Intravenous Q8H PRN Rayburn, Neta Mends, PA-C      . mirabegron ER (MYRBETRIQ) tablet 50 mg  50 mg Oral Daily  Rayburn, Neta Mends, PA-C   50 mg at 04/05/19 1037  . multivitamin with minerals tablet 1 tablet  1 tablet Oral Daily Rayburn, Neta Mends, PA-C   1 tablet at 04/05/19 1036  . ondansetron (ZOFRAN) tablet 4 mg  4 mg Oral Q6H PRN Rayburn, Neta Mends, PA-C       Or  . ondansetron (ZOFRAN) injection 4 mg  4 mg Intravenous Q6H PRN Rayburn, Neta Mends, PA-C   4 mg at 04/03/19 1506  . oxyCODONE (Oxy IR/ROXICODONE) immediate release tablet 10-15 mg  10-15 mg Oral Q4H PRN Rayburn, Neta Mends, PA-C   15 mg at 04/03/19 0718  . oxyCODONE (Oxy IR/ROXICODONE) immediate release tablet 5-10 mg  5-10 mg Oral Q4H PRN Rayburn, Neta Mends, PA-C   10 mg at 04/04/19 0528  . pantoprazole (PROTONIX) EC tablet 40 mg  40 mg Oral Daily Rayburn, Neta Mends, PA-C   40 mg at 04/05/19 1040  . pregabalin (LYRICA) capsule 200 mg  200 mg Oral TID Rayburn, Neta Mends, PA-C   200 mg at 04/05/19 1039  . rosuvastatin (CRESTOR) tablet 20 mg  20 mg Oral QHS Rayburn, Neta Mends,  PA-C   20 mg at 04/04/19 2207  . traZODone (DESYREL) tablet 200 mg  200 mg Oral QHS Marybelle Killings, MD   Stopped at 04/04/19 2207  . umeclidinium-vilanterol (ANORO ELLIPTA) 62.5-25 MCG/INH 1 puff  1 puff Inhalation Daily Rayburn, Neta Mends, PA-C   1 puff at 04/05/19 1042  . vitamin B-12 (CYANOCOBALAMIN) tablet 100 mcg  100 mcg Oral Daily Rayburn, Neta Mends, PA-C   100 mcg at 04/05/19 1040  . vitamin C (ASCORBIC ACID) tablet 500 mg  500 mg Oral Daily Rayburn, Neta Mends, PA-C   500 mg at 04/05/19 1041     Discharge Medications: Please see discharge summary for a list of discharge medications.  Relevant Imaging Results:  Relevant Lab Results:   Additional Information SSN: [id number]  Alberteen Sam, LCSW

## 2019-04-05 NOTE — Progress Notes (Signed)
Pt refused blood draw by lab. Lab stated they would come around 6AM to retry to draw blood again.

## 2019-04-05 NOTE — Progress Notes (Addendum)
Dr. Sharol Given contacted per family request. Family member stated she could not understand pt language and concerned she may have experienced a stroke. Family stated this is not her baseline language. Internal medicine will be consulted per Sharol Given.

## 2019-04-05 NOTE — Plan of Care (Signed)

## 2019-04-05 NOTE — Progress Notes (Addendum)
Physical Therapy Treatment Patient Details Name: Jennifer [name] MRN: [id number] DOB: 1960/12/24 Today's Date: 04/05/2019    History of Present Illness Pt is a 58 y.o. female admitted 04/01/19 with L BKA infection. Now s/p revision of L transtibial amputation with wound vac placement 4/10. Pt with worsening weakness, slurred speech and spasms; rapid response called 4/13 reporting NIH of 0; no further imaging ordered. PMH includes DM, COPD, HTN, severe PVD, CHF, fibromyalgia, L BKA (09/2013), R toe amputation (12/2016).   PT Comments    Pt limited with mobility. Demonstrates increased weakness, slurred speech, muscle spasms(?) with AROM, unable to hold isometric movement in any extremity. Per PT evaluation note 4/10, cognition WFL; today, pt demonstrating poor attention, decreased awareness and delayed processing (RN has consulted rapid response and paged MD regarding this). Spoke with son on phone who reports this has in fact happened before and lasted "just a little while," but pt or son unable to provide much more detail beyond this. Sons have not decided on SNF yet (CSW notified).   Follow Up Recommendations  SNF;Supervision/Assistance - 24 hour(CIR declined)     Equipment Recommendations  (TBD)    Recommendations for Other Services       Precautions / Restrictions Precautions Precautions: Fall Precaution Comments: LLE wound vac; tremors/spasms withAROM    Mobility  Bed Mobility Overal bed mobility: Needs Assistance Bed Mobility: Rolling Rolling: Mod assist         General bed mobility comments: ModA for bed mobility and repositioning; further mobility deferred due to spasms with all AROM and loss of LUE IV due to this  Transfers                 General transfer comment: unsafe to attempt at this time  Ambulation/Gait                 Stairs             Wheelchair Mobility    Modified Rankin (Stroke Patients Only)       Balance                                             Cognition Arousal/Alertness: Awake/alert Behavior During Therapy: Flat affect Overall Cognitive Status: No family/caregiver present to determine baseline cognitive functioning Area of Impairment: Orientation;Attention;Following commands;Safety/judgement;Awareness;Problem solving                 Orientation Level: Disoriented to;Place Current Attention Level: Sustained   Following Commands: Follows one step commands with increased time Safety/Judgement: Decreased awareness of safety;Decreased awareness of deficits Awareness: Emergent Problem Solving: Slow processing;Requires verbal cues General Comments: Pt initially stating home address when asked where she is, but then able to tell her son on phone she is in hospital. Slurred speech. Pt keeps reporting "this has happened before" referring to her spasms/weakness. Very easily distracted, poor awareness      Exercises Other Exercises Other Exercises: R knee flex/ext, only able to hold SLR for ~3 sec before spasm and leg falling back to bed; L hip flexion, minimal knee flex/ext    General Comments        Pertinent Vitals/Pain Pain Assessment: Faces Faces Pain Scale: Hurts a little bit Pain Location: LLE with mobility Pain Descriptors / Indicators: Operative site guarding;Grimacing;Guarding Pain Intervention(s): Limited activity within patient's tolerance;Repositioned    Home Living  Prior Function            PT Goals (current goals can now be found in the care plan section) Acute Rehab PT Goals Patient Stated Goal: Rehab at SNF before returning to son's house PT Goal Formulation: With patient/family Time For Goal Achievement: 04/15/19 Potential to Achieve Goals: Good Progress towards PT goals: Not progressing toward goals - comment(Limited by muscle 'spasms')    Frequency    Min 3X/week      PT Plan Discharge plan needs to be  updated;Frequency needs to be updated    Co-evaluation              AM-PAC PT "6 Clicks" Mobility   Outcome Measure  Help needed turning from your back to your side while in a flat bed without using bedrails?: A Lot Help needed moving from lying on your back to sitting on the side of a flat bed without using bedrails?: A Lot Help needed moving to and from a bed to a chair (including a wheelchair)?: A Lot Help needed standing up from a chair using your arms (e.g., wheelchair or bedside chair)?: A Lot Help needed to walk in hospital room?: Total Help needed climbing 3-5 steps with a railing? : Total 6 Click Score: 10    End of Session   Activity Tolerance: Patient limited by fatigue;Other (comment) Patient left: in bed;with call bell/phone within reach;with bed alarm set;with nursing/sitter in room Nurse Communication: Mobility status PT Visit Diagnosis: Other abnormalities of gait and mobility (R26.89);Muscle weakness (generalized) (M62.81);Pain Pain - Right/Left: Left Pain - part of body: Leg     Time: 9532-0233 PT Time Calculation (min) (ACUTE ONLY): 20 min  Charges:  $Therapeutic Exercise: 8-22 mins                    Mabeline Caras, PT, DPT Acute Rehabilitation Services  Pager [phone] Office [place] 04/05/2019, 4:15 PM

## 2019-04-05 NOTE — Progress Notes (Deleted)
Dr. Sharol Given contacted per family request. Family member stated she could not understand pt language and concerned she may have experienced a stoke. Family stated this is not her baseline language. Internal medicine will be consulted per Sharol Given.

## 2019-04-05 NOTE — Progress Notes (Signed)
[name], female   DOB: 22-Mar-1961, 58 y.o.   MRN: [id number] Patient is alert and oriented this morning she has no complaints.  Patient states that her shakes have reoccurred.  Patient states she has had these for over 2 years.  She has tried muscle relaxant medications which have not helped.  She has 200 cc in the wound VAC canister.  Recommendations are now to discharge to skilled nursing she was not felt to be a good candidate for inpatient rehabilitation.

## 2019-04-05 NOTE — Progress Notes (Signed)
Pt swallowed pills whole without difficulty swallowing.

## 2019-04-06 ENCOUNTER — Inpatient Hospital Stay (HOSPITAL_COMMUNITY): Payer: Medicare Other

## 2019-04-06 DIAGNOSIS — L7622 Postprocedural hemorrhage and hematoma of skin and subcutaneous tissue following other procedure: Secondary | ICD-10-CM | POA: Diagnosis not present

## 2019-04-06 DIAGNOSIS — I5030 Unspecified diastolic (congestive) heart failure: Secondary | ICD-10-CM

## 2019-04-06 DIAGNOSIS — G4733 Obstructive sleep apnea (adult) (pediatric): Secondary | ICD-10-CM | POA: Diagnosis not present

## 2019-04-06 DIAGNOSIS — I509 Heart failure, unspecified: Secondary | ICD-10-CM | POA: Diagnosis not present

## 2019-04-06 DIAGNOSIS — R296 Repeated falls: Secondary | ICD-10-CM | POA: Diagnosis not present

## 2019-04-06 DIAGNOSIS — E119 Type 2 diabetes mellitus without complications: Secondary | ICD-10-CM | POA: Diagnosis not present

## 2019-04-06 DIAGNOSIS — J449 Chronic obstructive pulmonary disease, unspecified: Secondary | ICD-10-CM | POA: Diagnosis not present

## 2019-04-06 DIAGNOSIS — F332 Major depressive disorder, recurrent severe without psychotic features: Secondary | ICD-10-CM

## 2019-04-06 DIAGNOSIS — R531 Weakness: Secondary | ICD-10-CM | POA: Diagnosis not present

## 2019-04-06 DIAGNOSIS — M9931 Osseous stenosis of neural canal of cervical region: Secondary | ICD-10-CM | POA: Diagnosis not present

## 2019-04-06 DIAGNOSIS — Z89512 Acquired absence of left leg below knee: Secondary | ICD-10-CM

## 2019-04-06 DIAGNOSIS — E1169 Type 2 diabetes mellitus with other specified complication: Secondary | ICD-10-CM | POA: Diagnosis not present

## 2019-04-06 DIAGNOSIS — R58 Hemorrhage, not elsewhere classified: Secondary | ICD-10-CM | POA: Diagnosis not present

## 2019-04-06 DIAGNOSIS — E11319 Type 2 diabetes mellitus with unspecified diabetic retinopathy without macular edema: Secondary | ICD-10-CM | POA: Diagnosis not present

## 2019-04-06 DIAGNOSIS — J45909 Unspecified asthma, uncomplicated: Secondary | ICD-10-CM | POA: Diagnosis not present

## 2019-04-06 DIAGNOSIS — E1140 Type 2 diabetes mellitus with diabetic neuropathy, unspecified: Secondary | ICD-10-CM | POA: Diagnosis not present

## 2019-04-06 DIAGNOSIS — M2550 Pain in unspecified joint: Secondary | ICD-10-CM | POA: Diagnosis not present

## 2019-04-06 DIAGNOSIS — G8929 Other chronic pain: Secondary | ICD-10-CM

## 2019-04-06 DIAGNOSIS — M4696 Unspecified inflammatory spondylopathy, lumbar region: Secondary | ICD-10-CM | POA: Diagnosis not present

## 2019-04-06 DIAGNOSIS — I5032 Chronic diastolic (congestive) heart failure: Secondary | ICD-10-CM | POA: Diagnosis not present

## 2019-04-06 DIAGNOSIS — Z7401 Bed confinement status: Secondary | ICD-10-CM | POA: Diagnosis not present

## 2019-04-06 DIAGNOSIS — W19XXXA Unspecified fall, initial encounter: Secondary | ICD-10-CM | POA: Diagnosis not present

## 2019-04-06 DIAGNOSIS — R5381 Other malaise: Secondary | ICD-10-CM | POA: Diagnosis not present

## 2019-04-06 DIAGNOSIS — R5383 Other fatigue: Secondary | ICD-10-CM

## 2019-04-06 DIAGNOSIS — I110 Hypertensive heart disease with heart failure: Secondary | ICD-10-CM | POA: Diagnosis not present

## 2019-04-06 DIAGNOSIS — F17210 Nicotine dependence, cigarettes, uncomplicated: Secondary | ICD-10-CM | POA: Diagnosis not present

## 2019-04-06 DIAGNOSIS — Z79899 Other long term (current) drug therapy: Secondary | ICD-10-CM | POA: Diagnosis not present

## 2019-04-06 DIAGNOSIS — E785 Hyperlipidemia, unspecified: Secondary | ICD-10-CM

## 2019-04-06 DIAGNOSIS — Z4781 Encounter for orthopedic aftercare following surgical amputation: Secondary | ICD-10-CM | POA: Diagnosis not present

## 2019-04-06 DIAGNOSIS — S88112A Complete traumatic amputation at level between knee and ankle, left lower leg, initial encounter: Secondary | ICD-10-CM | POA: Diagnosis not present

## 2019-04-06 DIAGNOSIS — T8130XA Disruption of wound, unspecified, initial encounter: Secondary | ICD-10-CM | POA: Diagnosis not present

## 2019-04-06 LAB — GLUCOSE, CAPILLARY
Glucose-Capillary: 125 mg/dL — ABNORMAL HIGH (ref 70–99)
Glucose-Capillary: 188 mg/dL — ABNORMAL HIGH (ref 70–99)
Glucose-Capillary: 238 mg/dL — ABNORMAL HIGH (ref 70–99)

## 2019-04-06 LAB — T4, FREE: Free T4: 1.16 ng/dL (ref 0.82–1.77)

## 2019-04-06 MED ORDER — GADOBUTROL 1 MMOL/ML IV SOLN
10.0000 mL | Freq: Once | INTRAVENOUS | Status: AC | PRN
  Administered 2019-04-06: 10 mL via INTRAVENOUS

## 2019-04-06 MED ORDER — PREGABALIN 200 MG PO CAPS: 200.0000 mg | ORAL_CAPSULE | Freq: Three times a day (TID) | ORAL | 0 refills | Status: DC

## 2019-04-06 MED ORDER — ACETAMINOPHEN 500 MG PO TABS: 500.0000 mg | ORAL_TABLET | Freq: Four times a day (QID) | ORAL | 0 refills | Status: DC | PRN

## 2019-04-06 NOTE — Progress Notes (Addendum)
  Date: 04/06/2019  Patient name: Jennifer [name]  Medical record number: [id number]  Date of birth: 04-Feb-1961   I have seen and evaluated this patient and I have discussed the plan of care with the house staff. Please see Dr. Nelma Rothman note for complete details. I concur with his findings and plan.    The patient is improved today.  She has no focal deficits, speech is clear and she has no fasciculations or fatigue on my exam.  She notes that she has episodes like this for the last 2 years, last one was 1-2 weeks before this admission.  She will get severely fatigued and weak and then it will resolve in 1-2 days.  The symptoms are not focal and she does not have stroke like symptoms.  The last time she was so weak she had to get her sons to help her use the bathroom.  She feels that this was the same issue as yesterday, but that her symptoms are mostly gone (though not completely resolved).  She is not sure what causes them.    I reviewed her last PCP notes.  She has severe uncontrolled depression which could play a part and other chronic issues including HTN, chronic pain, DM2, OSA, COPD.  It is not clear what is causing her symptoms, but they are mostly resolved today.  She would need to be worked up as an outpatient however and would be medically okay to be transitioned to rehab today.  Would schedule a PCP appointment with Dr. [name] when she is out of rehab.  Possibly virtual if we are still social distancing.   IM team will sign off.  Let us know how else we can be of help.   Sid Falcon, MD 04/06/2019, 10:43 AM

## 2019-04-06 NOTE — Progress Notes (Addendum)
Pt. D/C to [facility] facility at [facility]. Gave report to Vickii Chafe, Therapist, sports at [facility] prior to D/C. Pt. Is alert and oriented x 4.  Wound vac connected to [place] home wound vac.

## 2019-04-06 NOTE — Care Management Important Message (Signed)
Important Message  Patient Details  Name: Jennifer [name] MRN: [id number] Date of Birth: 1961/03/26   Medicare Important Message Given:  Yes    Hadassah Rana Montine Circle 04/06/2019, 9:24 AM

## 2019-04-06 NOTE — Progress Notes (Signed)
[name], female   DOB: [birth year], 58 y.o.   MRN: [id number] Patient alert and oriented, normal affect this morning no complaints.  Patient was not felt to be a candidate for inpatient rehab will plan for outpatient rehab patient is in agreement.  No new drainage in the wound VAC canister approximately 200 cc.

## 2019-04-06 NOTE — Discharge Summary (Signed)
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 female who was admitted 04/01/2019 with a chief complaint of dehiscence left BKA, with a final diagnosis of Dehiscence Left Below Knee Amputation.  Patient was brought to the operating room on 04/01/2019 and underwent Procedure(s): REVISION LEFT BELOW KNEE AMPUTATION Application Of Wound Vac.    Patient was given perioperative antibiotics:  Anti-infectives (From admission, onward)   Start     Dose/Rate Route Frequency Ordered Stop   04/01/19 1200  fluconazole (DIFLUCAN) tablet 100 mg     100 mg Oral Daily 04/01/19 1147     04/01/19 0615  ceFAZolin (ANCEF) IVPB 2g/100 mL premix     2 g 200 mL/hr over 30 Minutes Intravenous On call to O.R. 04/01/19 [id number] 04/01/19 0957    .  Patient was given sequential compression devices, early ambulation, and aspirin for DVT prophylaxis.  Recent vital signs:  Patient Vitals for the past 24 hrs:  BP Temp Temp src Pulse Resp SpO2  04/06/19 0908 -- -- -- -- -- 92 %  04/06/19 0558 (!) 103/30 98.6 F (37 C) Oral 80 17 95 %  04/05/19 2021 (!) 106/53 98.3 F (36.8 C) Oral 81 17 90 %  04/05/19 1822 -- -- -- -- -- 90 %  04/05/19 1514 (!) 114/55 98.6 F (37 C) Oral 75 17 --  .  Recent laboratory studies: No results found.  Discharge Medications:   Allergies as of 04/06/2019      Reactions   Iohexol Other (See Comments)    Desc: IV CONTRAST CAUSE NEPHROPATHY IN 2007   Ivp Dye [iodinated Diagnostic Agents] Other (See Comments)   Desc: IV CONTRAST CAUSE NEPHROPATHY IN 2007   Abilify [aripiprazole] Other (See Comments)   Urinary freq Nov 2016   Morphine Sulfate Itching, Rash      Medication List     STOP taking these medications   doxycycline 100 MG tablet Commonly known as:  VIBRA-TABS   ibuprofen 800 MG tablet Commonly known as:  ADVIL,MOTRIN   oxyCODONE-acetaminophen 5-325 MG tablet Commonly known as:  PERCOCET/ROXICET     TAKE these medications   acetaminophen 500 MG tablet Commonly known as:  TYLENOL Take 1 tablet (500 mg total) by mouth every 6 (six) hours as needed for mild pain.   albuterol 108 (90 Base) MCG/ACT inhaler Commonly known as:  PROVENTIL HFA;VENTOLIN HFA Inhale 1-2 puffs into the lungs every 4 (four) hours as needed for wheezing or shortness of breath.   albuterol (2.5 MG/3ML) 0.083% nebulizer solution Commonly known as:  PROVENTIL Take 3 mLs (2.5 mg total) by nebulization every 6 (six) hours as needed for wheezing.   amLODipine-benazepril 10-40 MG capsule Commonly known as:  LOTREL TAKE 1 CAPSULE BY MOUTH EVERY DAY   atenolol 100 MG tablet Commonly known as:  TENORMIN TAKE 1 TABLET BY MOUTH EVERY DAY   baclofen 10 MG tablet Commonly known as:  LIORESAL TAKE 1 TABLET BY MOUTH THREE TIMES A DAY AS NEEDED FOR MUSCLE SPASMS What changed:  See the new instructions.   betamethasone dipropionate 0.05 % cream Commonly known as:  DIPROLENE Apply 1 application topically 2 (two) times daily as needed (irritation).   buPROPion 300  MG 24 hr tablet Commonly known as:  WELLBUTRIN XL TAKE 1 TABLET BY MOUTH DAILY   cholecalciferol 1000 units tablet Commonly known as:  VITAMIN D Take 1,000 Units by mouth daily.   DULoxetine 30 MG capsule Commonly known as:  CYMBALTA TAKE 1 CAPSULE BY MOUTH EVERY DAY What changed:  how much to take   Easy Touch Pen Needles 31G X 8 MM Misc Generic drug:  Insulin Pen Needle USE TO INJECT INSULIN TWICE DAILY   fluconazole 200 MG tablet Commonly known as:  DIFLUCAN Take 2 tablets (400 mg total) by mouth daily for 1 day, THEN 1 tablet (200 mg total) daily for 13 days. Start taking on:  March 25, 2019   fluticasone 50  MCG/ACT nasal spray Commonly known as:  FLONASE INSTILL 1 SPRAY IN EACH NOSTRIL DAILY   furosemide 40 MG tablet Commonly known as:  LASIX TAKE ONE TABLET BY MOUTH DAILY AS NEEDED What changed:  reasons to take this   hydrochlorothiazide 25 MG tablet Commonly known as:  HYDRODIURIL TAKE 1 TABLET BY MOUTH ONCE DAILY   hydrOXYzine 25 MG tablet Commonly known as:  ATARAX/VISTARIL TAKE 1 TABLET (25 MG TOTAL) BY MOUTH 3 (THREE) TIMES DAILY AS NEEDED FOR ITCHING.   Insulin Degludec 200 UNIT/ML Sopn Commonly known as:  [place] FlexTouch Inject 66 Units into the skin daily. What changed:  how much to take   ipratropium 0.02 % nebulizer solution Commonly known as:  ATROVENT USE 1 VIAL VIA NEBULIZER EVERY 6 HOURS AS NEEDED FOR WHEEZING What changed:  See the new instructions.   metFORMIN 500 MG 24 hr tablet Commonly known as:  GLUCOPHAGE-XR TAKE 1 TABLET BY MOUTH EVERY DAY WITH BREAKFAST What changed:  See the new instructions.   mirabegron ER 50 MG Tb24 tablet Commonly known as:  Myrbetriq Take 1 tablet (50 mg total) by mouth daily.   multivitamin with minerals tablet Take 1 tablet by mouth daily.   nystatin powder Generic drug:  nystatin APPLY TO AFFECTED AREA THREE TIMES A DAY What changed:  See the new instructions.   omeprazole 20 MG capsule Commonly known as:  PRILOSEC Take 1 capsule (20 mg total) by mouth 2 (two) times daily before a meal.   OneTouch Delica Lancets 47Q Misc USE TO CHECK BLOOD SUGARS FOUR TIMES A DAY   OneTouch Verio test strip Generic drug:  glucose blood USE 3-4 TIMES DAILY TO CHECK BLOOD SUGAR   pregabalin 200 MG capsule Commonly known as:  LYRICA TAKE 1 CAPSULE BY MOUTH THREE TIMES A DAY What changed:  See the new instructions.   rosuvastatin 20 MG tablet Commonly known as:  CRESTOR TAKE 1 TABLET BY MOUTH AT BEDTIME   Toviaz 8 MG Tb24 tablet Generic drug:  fesoterodine Take 8 mg by mouth daily.   traZODone 100 MG tablet Commonly  known as:  DESYREL TAKE ONE TABLET BY MOUTH DAILY AT BEDTIME AS NEEDED FOR SLEEP What changed:  See the new instructions.   umeclidinium-vilanterol 62.5-25 MCG/INH Aepb Commonly known as:  Anoro Ellipta Inhale 1 puff into the lungs daily.   vitamin B-12 100 MCG tablet Commonly known as:  CYANOCOBALAMIN Take 100 mcg by mouth daily.   vitamin C 500 MG tablet Commonly known as:  ASCORBIC ACID Take 500 mg by mouth daily.       Diagnostic Studies: No results found.  Patient benefited maximally from their hospital stay and there were no complications.     Disposition: Discharge disposition: 03-Skilled Nursing  Facility      Discharge Instructions    Call MD / Call 911   Complete by:  As directed    If you experience chest pain or shortness of breath, CALL 911 and be transported to the hospital emergency room.  If you develope a fever above 101 F, pus (white drainage) or increased drainage or redness at the wound, or calf pain, call your surgeon's office.   Constipation Prevention   Complete by:  As directed    Drink plenty of fluids.  Prune juice may be helpful.  You may use a stool softener, such as Colace (over the counter) 100 mg twice a day.  Use MiraLax (over the counter) for constipation as needed.   Diet - low sodium heart healthy   Complete by:  As directed    Increase activity slowly as tolerated   Complete by:  As directed    Negative Pressure Wound Therapy - Incisional   Complete by:  As directed      Follow-up Information    Newt Minion, MD In 1 week.   Specialty:  Orthopedic Surgery Contact information: [address] [place] [address] [phone]            Signed: Newt Minion 04/06/2019, 11:27 AM

## 2019-04-06 NOTE — Progress Notes (Signed)
Occupational Therapy Treatment Patient Details Name: Jennifer [name] MRN: [id number] DOB: 09/09/1961 Today's Date: 04/06/2019    History of present illness Pt is a 58 y.o. female admitted 04/01/19 with L BKA infection. Now s/p revision of L transtibial amputation with wound vac placement 4/10. Pt with worsening weakness, slurred speech and spasms; rapid response called 4/13 reporting NIH of 0; no further imaging ordered. PMH includes DM, COPD, HTN, severe PVD, CHF, fibromyalgia, L BKA (09/2013), R toe amputation (12/2016).   OT comments  Pt refusing SNF but then re agreed when she needed significant A with hygiene after BM.  Pt has A at home but not A that will be able to A her with toileting.  Pt now wants SNF   Follow Up Recommendations  SNF    Equipment Recommendations  3 in 1 bedside commode;Other (comment)    Recommendations for Other Services      Precautions / Restrictions Precautions Precautions: Fall Precaution Comments: LLE wound vac;  Restrictions Weight Bearing Restrictions: Yes LLE Weight Bearing: Non weight bearing       Mobility Bed Mobility Overal bed mobility: Needs Assistance       Supine to sit: Mod assist     General bed mobility comments: pt on BSC  Transfers Overall transfer level: Needs assistance Equipment used: Rolling walker (2 wheeled) Transfers: Sit to/from Stand Sit to Stand: +2 safety/equipment;Max assist Stand pivot transfers: +2 safety/equipment       General transfer comment: Pt stood from bed and able to pivot from bed to [facility].  Pt had BM on commode and performed several additional sit to stand transfers requiring +2 assistance to maintain standing as she began to fatigue.      Balance Overall balance assessment: Needs assistance Sitting-balance support: No upper extremity supported Sitting balance-Leahy Scale: Fair     Standing balance support: Bilateral upper extremity supported Standing balance-Leahy Scale: Poor                              ADL either performed or assessed with clinical judgement   ADL Overall ADL's : Needs assistance/impaired                         Toilet Transfer: Cueing for safety;+2 for safety/equipment;+2 for physical assistance;Cueing for sequencing;BSC;RW Toilet Transfer Details (indicate cue type and reason): sit to stand ( switched out BSC for recliner in standing position)  Pt did not pivot Toileting- Clothing Manipulation and Hygiene: Cueing for sequencing;Cueing for safety;Sit to/from stand;Maximal assistance;+2 for safety/equipment;With caregiver independent assisting;Total assistance Toileting - Clothing Manipulation Details (indicate cue type and reason): total A with hygiene. pt does not have anyone at home to A with hygiene             Vision Baseline Vision/History: Wears glasses Patient Visual Report: No change from baseline            Cognition Arousal/Alertness: Awake/alert Behavior During Therapy: WFL for tasks assessed/performed Overall Cognitive Status: Within Functional Limits for tasks assessed                                                     Pertinent Vitals/ Pain       Pain Assessment: Faces Pain Score: 8  Pain  Location: LLE with mobility Pain Descriptors / Indicators: Operative site guarding;Grimacing;Guarding Pain Intervention(s): Monitored during session;Repositioned         Frequency  Min 2X/week        Progress Toward Goals  OT Goals(current goals can now be found in the care plan section)  Progress towards OT goals: Progressing toward goals     Plan Discharge plan needs to be updated    Co-evaluation      Reason for Co-Treatment: Complexity of the patient's impairments (multi-system involvement) PT goals addressed during session: Mobility/safety with mobility OT goals addressed during session: ADL's and self-care      AM-PAC OT "6 Clicks" Daily Activity     Outcome  Measure   Help from another person eating meals?: None Help from another person taking care of personal grooming?: A Little Help from another person toileting, which includes using toliet, bedpan, or urinal?: Total Help from another person bathing (including washing, rinsing, drying)?: A Lot Help from another person to put on and taking off regular upper body clothing?: A Little Help from another person to put on and taking off regular lower body clothing?: Total 6 Click Score: 14    End of Session Equipment Utilized During Treatment: Rolling walker;Gait belt  OT Visit Diagnosis: Unsteadiness on feet (R26.81);Other abnormalities of gait and mobility (R26.89);Muscle weakness (generalized) (M62.81);Pain Pain - Right/Left: Left Pain - part of body: Leg   Activity Tolerance Patient tolerated treatment well   Patient Left with call bell/phone within reach;in chair;with nursing/sitter in room   Nurse Communication Mobility status;Precautions;Weight bearing status        Time: 1335-1355 OT Time Calculation (min): 20 min  Charges: OT General Charges $OT Visit: 1 Visit OT Treatments $Self Care/Home Management : 8-22 mins  Kari Baars, [place] [phone] Office- [phone]      Daci Stubbe, Edwena Felty D 04/06/2019, 2:18 PM

## 2019-04-06 NOTE — TOC Initial Note (Signed)
Transition of Care [facility]) - Initial/Assessment Note    Patient Details  Name: Jennifer [name] MRN: [id number] Date of Birth: 1961-02-22  Transition of [facility]) CM/SW Contact:    Alberteen Sam, LCSW Phone Number: 04/06/2019, 11:45 AM  Clinical Narrative:                  CSW has consulted with patient's son [name] upon patient's permission to contact. He is agreeable for patient to go [place] for short term rehab and states preference for [facility] once reviewing bed offers. CSW will coordinate with [facility] for discharge planning.   Expected Discharge Plan: [place] Barriers to Discharge: No Barriers Identified   Patient Goals and CMS Choice Patient states their goals for this hospitalization and ongoing recovery are:: to go to [place] then home CMS Medicare.gov Compare Post Acute Care list provided to:: Patient Represenative (must comment)(son [place]) Choice offered to / list presented to : Adult Children  Expected Discharge Plan and Services Expected Discharge Plan: [place]   Discharge Planning Services: NA Post Acute Care Choice: [place] Living arrangements for the past 2 months: Single Family Home Expected Discharge Date: 04/06/19               DME Arranged: N/A DME Agency: NA HH Arranged: NA HH Agency: NA  Prior Living Arrangements/Services Living arrangements for the past 2 months: Single Family Home Lives with:: Self Patient language and need for interpreter reviewed:: Yes Do you feel safe going back to the place where you live?: No   needs short term rehab before returning home  Need for Family Participation in Patient Care: Yes (Comment) Care giver support system in place?: Yes (comment)   Criminal Activity/Legal Involvement Pertinent to Current Situation/Hospitalization: No - Comment as needed  Activities of Daily Living Home Assistive Devices/Equipment: CBG Meter, Walker  (specify type) ADL Screening (condition at time of admission) Patient's cognitive ability adequate to safely complete daily activities?: Yes Is the patient deaf or have difficulty hearing?: No Does the patient have difficulty seeing, even when wearing glasses/contacts?: No Does the patient have difficulty concentrating, remembering, or making decisions?: Yes Patient able to express need for assistance with ADLs?: Yes Does the patient have difficulty dressing or bathing?: Yes Independently performs ADLs?: Yes (appropriate for developmental age) Does the patient have difficulty walking or climbing stairs?: Yes Weakness of Legs: Left Weakness of Arms/Hands: None  Permission Sought/Granted Permission sought to share information with : Case Manager, Customer service manager, Family Supports Permission granted to share information with : Yes, Verbal Permission Granted  Share Information with NAME: [name]  Permission granted to share info w AGENCY: SNFs  Permission granted to share info w Relationship: son  Permission granted to share info w Contact Information: [phone]  Emotional Assessment Appearance:: Appears stated age Attitude/Demeanor/Rapport: Self-Confident Affect (typically observed): Accepting Orientation: : Oriented to Self, Oriented to  Time, Oriented to Place, Oriented to Situation Alcohol / Substance Use: Not Applicable Psych Involvement: No (comment)  Admission diagnosis:  Dehiscence Left Below Knee Amputation Patient Active Problem List   Diagnosis Date Noted  . Fatigue 04/06/2019  . 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 Dehiscence of amputation stump ([place])   . Oropharyngeal candidiasis 03/25/2019  . Cellulitis of left lower extremity 03/09/2019  . Vulvar cysts 01/27/2019  . Urinary incontinence 05/13/2018  . OSA (obstructive sleep apnea) 06/12/2017  . Morbid obesity with BMI of  40.0-44.9, adult ([place]) 03/26/2017  . Toe  amputation status, right 01/16/2017  . Thrombocytopenia ([place]) 06/05/2016  . 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 Type 2 diabetes with nephropathy ([place]) 09/07/2015  . Uncontrolled diabetes mellitus with retinopathy, due to underlying condition, without macular edema ([place]) 09/05/2015  . Diabetic retinopathy ([place]) 09/05/2015  . Counseling regarding end of life decision making 06/14/2015  . Atherosclerosis of aorta ([place]) 04/04/2015  . Anemia 10/05/2014  . Chest pain 09/16/2013  . Chronic diastolic heart failure ([place])   . S/P BKA (below knee amputation) unilateral ([place])   . Tobacco abuse   . Severe obesity (BMI >= 40) ([place]) 03/02/2013  . Abnormality of gait 03/01/2013  . Healthcare maintenance 07/10/2012  . Chronic prescription opiate use 12/03/2011  . Secondary diabetes mellitus with peripheral vascular disease ([place]) 08/27/2011  . 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 Hypertension associated with diabetes ([place]) 11/29/2009  . Chronic insomnia 10/25/2009  . GASTROESOPHAGEAL REFLUX DISEASE 11/24/2008  . Depression, major, severe recurrence ([place]) 04/06/2008  . DM (diabetes mellitus) type II uncontrolled, periph vascular disorder ([place]) 04/02/2007  . Hyperlipidemia associated with type 2 diabetes mellitus ([place]) 01/08/2007  . COPD (chronic obstructive pulmonary disease) ([place]) 01/08/2007   PCP:  Bartholomew Crews, MD Pharmacy:   [id number] - [place], [place]. [place]  [address] Phone: [phone] Fax: [phone]  [place], [place] [address] [place] [address] [place] [address] Phone: [phone] Fax: [phone]     Social Determinants of Health (SDOH) Interventions    Readmission Risk Interventions No flowsheet data found.

## 2019-04-06 NOTE — Care Management (Signed)
CM called [facility] to arrange for transport home. Jennifer [name] will be within an hour. Bedside RN notified, Patient's son [name] was notified.     Ricki Miller, RN Case Manager [phone]

## 2019-04-06 NOTE — Progress Notes (Signed)
Physical Therapy Treatment Patient Details Name: Jennifer [name] MRN: [id number] DOB: 08/13/1961 Today's Date: 04/06/2019    History of Present Illness Pt is a 58 y.o. female admitted 04/01/19 with L BKA infection. Now s/p revision of L transtibial amputation with wound vac placement 4/10. Pt with worsening weakness, slurred speech and spasms; rapid response called 4/13 reporting NIH of 0; no further imaging ordered. PMH includes DM, COPD, HTN, severe PVD, CHF, fibromyalgia, L BKA (09/2013), R toe amputation (12/2016).    PT Comments    Pt performed sit to stand and stand pivot from bed to 3:1 commode.  Pt tolerated standing multiple times but begins to fatigue with each additional sit to stand trial.  Pt continues to benefit from skilled rehab at SNF to improve strength and function.  Pt is apprehensive of returning home after she was unable to manage her pericare after BM.  SW and CM notified.      Follow Up Recommendations  SNF;Supervision/Assistance - 24 hour(if patient refuses she will require HHPT at d/c)     Equipment Recommendations  3in1 (PT);Other (comment)(3:1 drop arm commode.  Will require ambulance transport into home.  )    Recommendations for Other Services       Precautions / Restrictions Precautions Precautions: Fall Precaution Comments: LLE wound vac;  Restrictions Weight Bearing Restrictions: Yes LLE Weight Bearing: Non weight bearing    Mobility  Bed Mobility Overal bed mobility: Needs Assistance       Supine to sit: Mod assist     General bed mobility comments: pt on BSC  Transfers Overall transfer level: Needs assistance Equipment used: Rolling walker (2 wheeled) Transfers: Sit to/from Stand Sit to Stand: +2 safety/equipment;Max assist Stand pivot transfers: +2 safety/equipment       General transfer comment: Pt stood from bed and able to pivot from bed to [facility].  Pt had BM on commode and performed several additional sit to stand transfers  requiring +2 assistance to maintain standing as she began to fatigue.    Ambulation/Gait Ambulation/Gait assistance: (NT unable to hop to.  )               Stairs             Wheelchair Mobility    Modified Rankin (Stroke Patients Only)       Balance Overall balance assessment: Needs assistance Sitting-balance support: No upper extremity supported Sitting balance-Leahy Scale: Fair     Standing balance support: Bilateral upper extremity supported Standing balance-Leahy Scale: Poor                              Cognition Arousal/Alertness: Awake/alert Behavior During Therapy: WFL for tasks assessed/performed Overall Cognitive Status: Within Functional Limits for tasks assessed                                        Exercises      General Comments        Pertinent Vitals/Pain Pain Assessment: Faces Pain Score: 8  Pain Location: LLE with mobility Pain Descriptors / Indicators: Operative site guarding;Grimacing;Guarding Pain Intervention(s): Monitored during session;Repositioned    Home Living                      Prior Function  PT Goals (current goals can now be found in the care plan section) Acute Rehab PT Goals Patient Stated Goal: Rehab at SNF before returning to son's house Potential to Achieve Goals: Good Progress towards PT goals: Progressing toward goals    Frequency    Min 3X/week      PT Plan Current plan remains appropriate    Co-evaluation PT/OT/SLP Co-Evaluation/Treatment: Yes Reason for Co-Treatment: Complexity of the patient's impairments (multi-system involvement) PT goals addressed during session: Mobility/safety with mobility OT goals addressed during session: ADL's and self-care      AM-PAC PT "6 Clicks" Mobility   Outcome Measure  Help needed turning from your back to your side while in a flat bed without using bedrails?: A Lot Help needed moving from lying on  your back to sitting on the side of a flat bed without using bedrails?: A Lot Help needed moving to and from a bed to a chair (including a wheelchair)?: A Lot Help needed standing up from a chair using your arms (e.g., wheelchair or bedside chair)?: A Lot Help needed to walk in hospital room?: Total Help needed climbing 3-5 steps with a railing? : Total 6 Click Score: 10    End of Session Equipment Utilized During Treatment: Gait belt Activity Tolerance: Patient limited by fatigue;Other (comment) Patient left: in bed;with call bell/phone within reach;with bed alarm set;with nursing/sitter in room Nurse Communication: Mobility status PT Visit Diagnosis: Other abnormalities of gait and mobility (R26.89);Muscle weakness (generalized) (M62.81);Pain Pain - Right/Left: Left Pain - part of body: Leg     Time: 7711-6579 PT Time Calculation (min) (ACUTE ONLY): 37 min  Charges:  $Therapeutic Activity: 8-22 mins                     Governor Rooks, PTA Acute Rehabilitation Services Pager [phone] Office [phone]     Wilfred Dayrit Eli Hose 04/06/2019, 2:21 PM

## 2019-04-06 NOTE — Progress Notes (Signed)
CSW spoke with patient son Jennifer [name] who reports preference for [facility]. They are able to accept today if patient medically ready.   [place], Home

## 2019-04-06 NOTE — Progress Notes (Signed)
Patient will DC to: [place] Anticipated DC date: 04/06/2019 Family notified: [facility] RJ:[id number]  Per MD patient ready for DC to [facility] . RN, patient, patient's family, and facility notified of DC. Discharge Summary sent to facility. RN given number for report [phone] Room 129. DC packet on chart. Ambulance transport requested for patient.  CSW signing off.  [place], [place]

## 2019-04-06 NOTE — Progress Notes (Signed)
   Subjective: Jennifer [name] is feeling better today. Se does not feel as tired, but still notes she has some weakness but feels better overall today. She feels that her speech is less slurred with fewer muscle spasms as well.   Objective:  Vital signs in last 24 hours: Vitals:   04/05/19 1514 04/05/19 1822 04/05/19 2021 04/06/19 0558  BP: (!) 114/55  (!) 106/53 (!) 103/30  Pulse: 75  81 80  Resp: 17  17 17   Temp: 98.6 F (37 C)  98.3 F (36.8 C) 98.6 F (37 C)  TempSrc: Oral  Oral Oral  SpO2:  90% 90% 95%  Weight:      Height:       General: awake, alert, sitting up in bed in NAD Neuro: A&Ox3; able to hold up both arms compared to yesterday; no obvious fatigability. Strength is 5/5 in BLE; 5/5 in RLE. Decreased sensation in LUE compared to right which has been a chronic issue. Cranial nerves intact.   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 Due to concern for slurred speech and weakness the IMTS was consulted to evaluate the patient. There are not apparent focal neurological deficits noted on exam but the patient demonstrates easy fatigability with minimal exertion, exhibits repetitive yawning and mild dyslexia.   Plan: Generalized weakness vs muscle spasm: Symptoms resolved today. Likely acute on chronic with stress from surgery and deconditioning. No sudden onset fatigability during evaluation today. No current sign of CVA again today. Evaluation thus far unremarkable with slightly decreased TSH but fT4 normal. CK WNL's.  Will likely need to consider outpatient MRI with PCP at a later date for her chronic muscle fasciculations and fatigability as there is concern for chronic upper motor neuron disease. We will sign  off today. Okay to discharge to SNF as per her primary.   Dispo: Anticipated discharge as per primary.  Kathi Ludwig, MD [facility] Medicine, PGY-2

## 2019-04-06 NOTE — TOC Transition Note (Signed)
Transition of Care [facility]) - CM/SW Discharge Note   Patient Details  Name: Jennifer [name] MRN: [id number] Date of Birth: 24-May-1961  Transition of Care [facility]) CM/SW Contact:  Ninfa Meeker, RN Phone Number: 04/06/2019, 1:19 PM   Clinical Narrative:   58 yr old female admitted with dehiscence of left BKA. On 04/01/19  Patient underwent revision of left BKA with application of wound vac. Patient was arranged to got to SNF for shortterm rehab and declines now because she wants to be able to smoke. Patient's son [name] says [facility] for [facility] therapy. He said that he is not able to get her transported home and requests PTAR. [name] said he will pickup 3in1 from Hospital on tomorrow. CM will notify bedside nurse.     Final next level of care: [place] Barriers to Discharge: No Barriers Identified   Patient Goals and CMS Choice Patient states their goals for this hospitalization and ongoing recovery are:: (patient chooses to go home) CMS Medicare.gov Compare Post Acute Care list provided to:: Patient Represenative (must comment)(son [place]) Choice offered to / list presented to : Adult Children  Discharge Placement                       Discharge Plan and Services   Discharge Planning Services: CM Consult Post Acute Care Choice: [place]          DME Arranged: 3-N-1 DME Agency: [facility] HH Arranged: PT [place] Agency: [place] ([facility])   Social Determinants of Health (SDOH) Interventions     Readmission Risk Interventions No flowsheet data found.

## 2019-04-06 NOTE — Progress Notes (Signed)
Patient was prepared to go to [place] however she is now refusing as she does not want to abide by non smoking rules. Patient reports she would like to go home to smoke.   RNCM aware and setting up with [facility] and 3in1. [name] reports patient already has walker at home and is updated on dc plan.   [place], [place]

## 2019-04-07 ENCOUNTER — Ambulatory Visit: Payer: Self-pay | Admitting: Dietician

## 2019-04-07 DIAGNOSIS — I5030 Unspecified diastolic (congestive) heart failure: Secondary | ICD-10-CM | POA: Diagnosis not present

## 2019-04-07 DIAGNOSIS — T8130XA Disruption of wound, unspecified, initial encounter: Secondary | ICD-10-CM | POA: Diagnosis not present

## 2019-04-07 DIAGNOSIS — E11319 Type 2 diabetes mellitus with unspecified diabetic retinopathy without macular edema: Secondary | ICD-10-CM | POA: Diagnosis not present

## 2019-04-07 DIAGNOSIS — E1169 Type 2 diabetes mellitus with other specified complication: Secondary | ICD-10-CM | POA: Diagnosis not present

## 2019-04-07 LAB — T3, FREE: T3, Free: 1.9 pg/mL — ABNORMAL LOW (ref 2.0–4.4)

## 2019-04-08 DIAGNOSIS — E11319 Type 2 diabetes mellitus with unspecified diabetic retinopathy without macular edema: Secondary | ICD-10-CM | POA: Diagnosis not present

## 2019-04-08 DIAGNOSIS — T8130XA Disruption of wound, unspecified, initial encounter: Secondary | ICD-10-CM | POA: Diagnosis not present

## 2019-04-08 DIAGNOSIS — E1169 Type 2 diabetes mellitus with other specified complication: Secondary | ICD-10-CM | POA: Diagnosis not present

## 2019-04-08 DIAGNOSIS — S88112A Complete traumatic amputation at level between knee and ankle, left lower leg, initial encounter: Secondary | ICD-10-CM | POA: Diagnosis not present

## 2019-04-09 ENCOUNTER — Telehealth (INDEPENDENT_AMBULATORY_CARE_PROVIDER_SITE_OTHER): Payer: Self-pay | Admitting: Specialist

## 2019-04-12 ENCOUNTER — Ambulatory Visit: Payer: Self-pay | Admitting: Dietician

## 2019-04-12 ENCOUNTER — Other Ambulatory Visit: Payer: Self-pay

## 2019-04-12 ENCOUNTER — Encounter (HOSPITAL_COMMUNITY): Payer: Self-pay

## 2019-04-12 ENCOUNTER — Emergency Department (HOSPITAL_COMMUNITY)
Admission: EM | Admit: 2019-04-12 | Discharge: 2019-04-13 | Disposition: A | Discharge status: Home / Self Care | Payer: Medicare Other | Attending: Emergency Medicine | Admitting: Emergency Medicine

## 2019-04-12 DIAGNOSIS — R296 Repeated falls: Secondary | ICD-10-CM | POA: Diagnosis not present

## 2019-04-12 DIAGNOSIS — F17210 Nicotine dependence, cigarettes, uncomplicated: Secondary | ICD-10-CM | POA: Diagnosis not present

## 2019-04-12 DIAGNOSIS — E119 Type 2 diabetes mellitus without complications: Secondary | ICD-10-CM | POA: Insufficient documentation

## 2019-04-12 DIAGNOSIS — I110 Hypertensive heart disease with heart failure: Secondary | ICD-10-CM | POA: Insufficient documentation

## 2019-04-12 DIAGNOSIS — Z79899 Other long term (current) drug therapy: Secondary | ICD-10-CM | POA: Insufficient documentation

## 2019-04-12 DIAGNOSIS — J45909 Unspecified asthma, uncomplicated: Secondary | ICD-10-CM | POA: Diagnosis not present

## 2019-04-12 DIAGNOSIS — W19XXXA Unspecified fall, initial encounter: Secondary | ICD-10-CM | POA: Diagnosis not present

## 2019-04-12 DIAGNOSIS — T8130XA Disruption of wound, unspecified, initial encounter: Secondary | ICD-10-CM | POA: Diagnosis not present

## 2019-04-12 DIAGNOSIS — J449 Chronic obstructive pulmonary disease, unspecified: Secondary | ICD-10-CM | POA: Insufficient documentation

## 2019-04-12 DIAGNOSIS — L7622 Postprocedural hemorrhage and hematoma of skin and subcutaneous tissue following other procedure: Secondary | ICD-10-CM | POA: Insufficient documentation

## 2019-04-12 DIAGNOSIS — E1169 Type 2 diabetes mellitus with other specified complication: Secondary | ICD-10-CM | POA: Diagnosis not present

## 2019-04-12 DIAGNOSIS — E11319 Type 2 diabetes mellitus with unspecified diabetic retinopathy without macular edema: Secondary | ICD-10-CM | POA: Diagnosis not present

## 2019-04-12 DIAGNOSIS — I5032 Chronic diastolic (congestive) heart failure: Secondary | ICD-10-CM | POA: Diagnosis not present

## 2019-04-12 DIAGNOSIS — T148XXA Other injury of unspecified body region, initial encounter: Secondary | ICD-10-CM

## 2019-04-12 MED ORDER — STERILE WATER FOR INJECTION IJ SOLN
INTRAMUSCULAR | Status: AC
  Administered 2019-04-12: 10 mL
  Filled 2019-04-12: qty 10

## 2019-04-12 MED ORDER — CEPHALEXIN 500 MG PO CAPS: 500.0000 mg | ORAL_CAPSULE | Freq: Four times a day (QID) | ORAL | 0 refills | Status: DC

## 2019-04-12 MED ORDER — CEFTRIAXONE SODIUM 1 G IJ SOLR
1.0000 g | Freq: Once | INTRAMUSCULAR | Status: AC
  Administered 2019-04-12: 1 g via INTRAMUSCULAR
  Filled 2019-04-12: qty 10

## 2019-04-12 NOTE — ED Triage Notes (Signed)
Per EMS, patient coming from [place] with complaints of a fall at 0430 AM today. Patient has a BKA on left leg that she had surgery on at [facility] during her 4/10 admission. Leg has been bleeding lightly since the fall. Facility reports changing the bandage several times today without getting control of the bleeding.

## 2019-04-12 NOTE — ED Notes (Signed)
Bed: NP00 Expected date:  Expected time:  Means of arrival:  Comments: F/SNF fall

## 2019-04-12 NOTE — ED Notes (Signed)
Left below the knee amputation that was recently operated on (sutures present) with light bleeding due to an injury from a mechanical fall at SNF. Placed combat gauze and ABD pad over wound site and wrapped with rolled gauze per MD instruction. Dressing clean and intact at this time. Will continue to monitor patient.

## 2019-04-12 NOTE — ED Notes (Signed)
PTAR called for transport back to [facility]. Paperwork printed and placed at nursing station.

## 2019-04-12 NOTE — ED Provider Notes (Signed)
[facility] DEPT Provider Note   CSN: [id number] Arrival date & time: 04/12/19  2143    History   Chief Complaint Chief Complaint  Patient presents with  . Fall    HPI Jennifer [name] is a 58 y.o. female.     Patient is a 58 year old female with past medical history of diabetes, peripheral neuropathy, CHF, and recent revision of left below the knee amputation.  This was performed by Dr. Sharol Given.  The patient fell earlier this morning.  Since that time she has had bleeding from her incision site.  Staff at her rehab facility has changed the dressings several times, however she continues to bleed through.  The history is provided by the patient.  Fall  This is a new problem. Episode onset: This morning. The problem occurs constantly. The problem has been gradually improving. Nothing aggravates the symptoms. Nothing relieves the symptoms. She has tried nothing for the symptoms.    Past Medical History:  Diagnosis Date  . Anginal pain [facility])    '3' of 10 ischemia ruled out 9/9   . Arthritis of lumbar spine   . Asthma   . Cataract   . CHF (congestive heart failure) ([place])   . 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 Chronic bronchitis ([place])    "I get it alot" (09/28/2013)  . Chronic diastolic heart failure (HCC)    grade 2 per 2D echocardiogram (01/2013)  . Chronic lower back pain   . Chronic osteomyelitis of foot (HCC)    chronic, right secondary to diabetic foot ulcers  . Chronic pain syndrome 12/03/2011   Likely secondary to depression, "fibromyalgia", neuropathy, and obesity. Lumbar MRI 2014 no sig change from prior (2008) : Stable hypertrophic facet disease most notable at L4-5. Stable shallow left foraminal/extraforaminal disc protrusion at L4-5. No direct neural compression.      Marland Kitchen COPD 01/08/2007   PFT's 05/2007 : FEV1/FVC 82, FEV1 64% pred, FEF 25-75% 40% predicted, 16% improvement in FEV1 with bronchodilators.     . Depression   .  Diabetes mellitus without complication ([place])    Type II  . Diabetic peripheral neuropathy ([place])   . DVT of upper extremity (deep vein thrombosis) ([place]) 03/11/2013   Secondary to PICC line. Right brachial vein, diagnosed on 03/10/2013 Coumadin for 3 months. End date 06/10/2013   . Dyspnea    "smoker"  . Environmental allergies    Hx: of  . Exertional shortness of breath   . Fatty liver 2003   observed on ultrasound abdomen  . Fibromyalgia   . GERD (gastroesophageal reflux disease)   . Glaucoma   . History of use of hearing aid   . Hyperlipidemia   . Hyperplastic colon polyp 12/2010   Per colonoscopy (12/2010) - Dr. Deatra Ina  . Hypertension   . 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 Blood cultures 01/26/13 S. Aureus and GBS. Blood cultures Feb 6th, 8th, and 9th and March were negative.Repeat TEE 3/20 negative for vegitations  . 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 2/2 tobacco use, DM, and cont weight bearing on surgical wound and developed gangrene   . Pneumonia   . Polymicrobial bacterial infection 01/2013   GBS and S. aureus bacteremia // Source likely infected diabetic foot ulcer  . PVD (peripheral vascular disease) with claudication ([place])    Stents to  bilateral common iliac arteries (left 2005, right 2008), on chronic plavix  . Rheumatoid arthritis ([place])   . Tobacco abuse   . Type II diabetes mellitus with peripheral circulatory disorders, uncontrolled DX: 1993   Insulin dep. Poor control. Complicated by diabetic foot ulcer and diabetic eye disease.    Marland Kitchen Ulcer of foot, chronic (HCC)    Left. No OM per MRI (01/2013)    Patient Active Problem List   Diagnosis Date Noted  . Fatigue 04/06/2019  . 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 Dehiscence of amputation stump ([place])   . Oropharyngeal candidiasis 03/25/2019  .  Cellulitis of left lower extremity 03/09/2019  . Vulvar cysts 01/27/2019  . Urinary incontinence 05/13/2018  . OSA (obstructive sleep apnea) 06/12/2017  . Morbid obesity with BMI of 40.0-44.9, adult ([place]) 03/26/2017  . Toe amputation status, right 01/16/2017  . Thrombocytopenia ([place]) 06/05/2016  . 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 Type 2 diabetes with nephropathy ([place]) 09/07/2015  . Uncontrolled diabetes mellitus with retinopathy, due to underlying condition, without macular edema ([place]) 09/05/2015  . Diabetic retinopathy ([place]) 09/05/2015  . Counseling regarding end of life decision making 06/14/2015  . Atherosclerosis of aorta ([place]) 04/04/2015  . Anemia 10/05/2014  . Chest pain 09/16/2013  . Chronic diastolic heart failure ([place])   . S/P BKA (below knee amputation) unilateral ([place])   . Tobacco abuse   . Severe obesity (BMI >= 40) ([place]) 03/02/2013  . Abnormality of gait 03/01/2013  . Healthcare maintenance 07/10/2012  . Chronic prescription opiate use 12/03/2011  . Secondary diabetes mellitus with peripheral vascular disease ([place]) 08/27/2011  . 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 ([place]) 11/29/2009  . Hypertension associated with diabetes ([place]) 11/29/2009  . Chronic insomnia 10/25/2009  . GASTROESOPHAGEAL REFLUX DISEASE 11/24/2008  . Depression, major, severe recurrence ([place]) 04/06/2008  . DM (diabetes mellitus) type II uncontrolled, periph vascular disorder ([place]) 04/02/2007  . Hyperlipidemia associated with type 2 diabetes mellitus ([place]) 01/08/2007  . COPD (chronic obstructive pulmonary disease) ([place]) 01/08/2007    Past Surgical History:  Procedure Laterality Date  . ABDOMINAL HYSTERECTOMY  1997   secondary to uterine fibroids  . AMPUTATION Left 08/31/2013   Procedure: AMPUTATION RAY;  Surgeon: Newt Minion, MD;  Location: [place];  Service: Orthopedics;  Laterality: Left;  Left Foot 5th Ray Amputation  . AMPUTATION Left 09/28/2013   Procedure: Left Midfoot amputation;  Surgeon: Newt Minion, MD;  Location: [facility];  Service: Orthopedics;  Laterality: Left;  Left Midfoot amputation  . AMPUTATION Left 10/14/2013   Procedure: AMPUTATION BELOW KNEE- left;  Surgeon: Newt Minion, MD;  Location: [facility];  Service: Orthopedics;  Laterality: Left;  Left Below Knee Amputation   . AMPUTATION TOE Right 01/15/2017   Procedure: AMPUTATION 5th TOE RIGHT FOOT;  Surgeon: Edrick Kins, DPM;  Location: [employer];  Service: Podiatry;  Laterality: Right;  . APPLICATION OF WOUND VAC  04/01/2019   Procedure: Application Of Wound Vac;  Surgeon: Newt Minion, MD;  Location: [facility];  Service: Orthopedics;;  . BLADDER SURGERY     bladder reconstruction surgery  . BREAST BIOPSY     multiple-benign per pt  . COLONOSCOPY    . ESOPHAGOGASTRODUODENOSCOPY N/A 09/20/2013   Procedure: ESOPHAGOGASTRODUODENOSCOPY (EGD);  Surgeon: Jerene Bears, MD;  Location: [facility];  Service: Gastroenterology;  Laterality: N/A;  . FOOT AMPUTATION THROUGH METATARSAL Left 09/28/2013  . GANGLION CYST EXCISION     multiple  .  PERIPHERAL VASCULAR INTERVENTION     stents in lower ext  . SHOULDER ARTHROSCOPY W/ ROTATOR CUFF REPAIR Bilateral   . SKIN SPLIT GRAFT Bilateral 05/13/2013   Procedure: Right and Left Foot Allograft Skin Graft;  Surgeon: Newt Minion, MD;  Location: [facility];  Service: Orthopedics;  Laterality: Bilateral;  Right and Left Foot Allograft Skin Graft  . STUMP REVISION Left 04/01/2019   Procedure: REVISION LEFT BELOW KNEE AMPUTATION;  Surgeon: Newt Minion, MD;  Location: [facility];  Service: Orthopedics;  Laterality: Left;  . TEE WITHOUT CARDIOVERSION N/A 01/31/2013   Procedure: TRANSESOPHAGEAL ECHOCARDIOGRAM (TEE);  Surgeon: Fay Records, MD;  Location: [place];  Service: Cardiovascular;  Laterality: N/A;  Rm [phone]  . TEE WITHOUT CARDIOVERSION N/A 03/10/2013   Procedure: TRANSESOPHAGEAL ECHOCARDIOGRAM (TEE);  Surgeon: Larey Dresser, MD;  Location: [facility];  Service: Cardiovascular;  Laterality: N/A;  Rm.  [phone]  . TOE AMPUTATION Left 08/31/2013   4TH & 5 TH TOE   . TONSILLECTOMY    . TUBAL LIGATION    . WRIST SURGERY Right    "for tumors" (09/28/2013)     OB History   No obstetric history on file.      Home Medications    Prior to Admission medications   Medication Sig Start Date End Date Taking? Authorizing Provider  acetaminophen (TYLENOL) 500 MG tablet Take 1 tablet (500 mg total) by mouth every 6 (six) hours as needed for mild pain. 04/06/19   Newt Minion, MD  albuterol (PROVENTIL HFA;VENTOLIN HFA) 108 (90 Base) MCG/ACT inhaler Inhale 1-2 puffs into the lungs every 4 (four) hours as needed for wheezing or shortness of breath.    [provider]  albuterol (PROVENTIL) (2.5 MG/3ML) 0.083% nebulizer solution Take 3 mLs (2.5 mg total) by nebulization every 6 (six) hours as needed for wheezing. 01/18/19   Mesner, Corene Cornea, MD  amLODipine-benazepril (LOTREL) 10-40 MG capsule TAKE 1 CAPSULE BY MOUTH EVERY DAY 03/25/19   Bartholomew Crews, MD  atenolol (TENORMIN) 100 MG tablet TAKE 1 TABLET BY MOUTH EVERY DAY Patient taking differently: Take 100 mg by mouth daily.  05/05/18   Bartholomew Crews, MD  baclofen (LIORESAL) 10 MG tablet TAKE 1 TABLET BY MOUTH THREE TIMES A DAY AS NEEDED FOR MUSCLE SPASMS Patient taking differently: Take 10 mg by mouth 3 (three) times daily as needed for muscle spasms.  07/05/18   Bartholomew Crews, MD  betamethasone dipropionate (DIPROLENE) 0.05 % cream Apply 1 application topically 2 (two) times daily as needed (irritation).    [provider]  buPROPion (WELLBUTRIN XL) 300 MG 24 hr tablet TAKE 1 TABLET BY MOUTH DAILY Patient taking differently: Take 300 mg by mouth daily.  11/26/18   Bartholomew Crews, MD  cholecalciferol (VITAMIN D) 1000 units tablet Take 1,000 Units by mouth daily.    [provider]  DULoxetine (CYMBALTA) 30 MG capsule TAKE 1 CAPSULE BY MOUTH EVERY DAY Patient taking differently: Take 30 mg by mouth daily.   09/15/18   Bartholomew Crews, MD  EASY TOUCH PEN NEEDLES 31G X 8 MM MISC USE TO INJECT INSULIN TWICE DAILY 08/04/18   Bartholomew Crews, MD  fesoterodine (TOVIAZ) 8 MG TB24 tablet Take 8 mg by mouth daily.  11/04/18   Bartholomew Crews, MD  fluticasone Asencion Islam) 50 MCG/ACT nasal spray INSTILL 1 SPRAY IN EACH NOSTRIL DAILY 03/30/19   Bartholomew Crews, MD  furosemide (LASIX) 40 MG tablet TAKE ONE TABLET  BY MOUTH DAILY AS NEEDED Patient taking differently: Take 40 mg by mouth daily as needed for fluid.  07/05/18   Bartholomew Crews, MD  hydrochlorothiazide (HYDRODIURIL) 25 MG tablet TAKE 1 TABLET BY MOUTH ONCE DAILY Patient taking differently: Take 25 mg by mouth daily.  10/01/18   Bartholomew Crews, MD  hydrOXYzine (ATARAX/VISTARIL) 25 MG tablet TAKE 1 TABLET (25 MG TOTAL) BY MOUTH 3 (THREE) TIMES DAILY AS NEEDED FOR ITCHING. 07/05/18   Bartholomew Crews, MD  Insulin Degludec (TRESIBA FLEXTOUCH) 200 UNIT/ML SOPN Inject 66 Units into the skin daily. Patient taking differently: Inject 70 Units into the skin daily.  01/27/19   Bartholomew Crews, MD  ipratropium (ATROVENT) 0.02 % nebulizer solution USE 1 VIAL VIA NEBULIZER EVERY 6 HOURS AS NEEDED FOR WHEEZING Patient taking differently: Take 0.5 mg by nebulization every 6 (six) hours as needed for wheezing.  08/04/18   Bartholomew Crews, MD  metFORMIN (GLUCOPHAGE-XR) 500 MG 24 hr tablet TAKE 1 TABLET BY MOUTH EVERY DAY WITH BREAKFAST Patient taking differently: Take 500 mg by mouth daily with breakfast.  05/05/18   Bartholomew Crews, MD  mirabegron ER (MYRBETRIQ) 50 MG TB24 tablet Take 1 tablet (50 mg total) by mouth daily. 10/29/18   Bartholomew Crews, MD  Multiple Vitamins-Minerals (MULTIVITAMIN WITH MINERALS) tablet Take 1 tablet by mouth daily.    [provider]  NYSTATIN powder APPLY TO AFFECTED AREA THREE TIMES A DAY Patient taking differently: Apply 1 g topically 3 (three) times daily as needed (irritation).   01/20/19   Bartholomew Crews, MD  omeprazole (PRILOSEC) 20 MG capsule Take 1 capsule (20 mg total) by mouth 2 (two) times daily before a meal. 03/03/19   Bartholomew Crews, MD  [facility] DELICA LANCETS 53G MISC USE TO CHECK BLOOD SUGARS FOUR TIMES A DAY 05/05/18   Bartholomew Crews, MD  [facility] VERIO test strip USE 3-4 TIMES DAILY TO CHECK BLOOD SUGAR 10/01/18   Bartholomew Crews, MD  pregabalin (LYRICA) 200 MG capsule Take 1 capsule (200 mg total) by mouth 3 (three) times daily. 04/06/19   Newt Minion, MD  rosuvastatin (CRESTOR) 20 MG tablet TAKE 1 TABLET BY MOUTH AT BEDTIME Patient taking differently: Take 20 mg by mouth at bedtime.  05/05/18   Bartholomew Crews, MD  traZODone (DESYREL) 100 MG tablet TAKE ONE TABLET BY MOUTH DAILY AT BEDTIME AS NEEDED FOR SLEEP Patient taking differently: Take 200 mg by mouth at bedtime as needed for sleep.  06/08/18   Bartholomew Crews, MD  umeclidinium-vilanterol (ANORO ELLIPTA) 62.5-25 MCG/INH AEPB Inhale 1 puff into the lungs daily. 03/30/19   Bartholomew Crews, MD  vitamin B-12 (CYANOCOBALAMIN) 100 MCG tablet Take 100 mcg by mouth daily.    [provider]  vitamin C (ASCORBIC ACID) 500 MG tablet Take 500 mg by mouth daily.    [provider]  Potassium Chloride ER 20 MEQ TBCR Take 40 mEq by mouth daily. 01/02/14 01/23/14  Othella Boyer, MD    Family History Family History  Problem Relation Age of Onset  . Diverticulosis Mother   . Diabetes Mother   . Hypertension Mother   . Congestive Heart Failure Mother   . Asthma Father   . CAD Sister 43       MI at age 76 per patient.  However, she has not had a stent or CABG.   . Heart disease Sister  before age 72  . Breast cancer Neg Hx     Social History Social History   Tobacco Use  . Smoking status: Current Every Day Smoker    Packs/day: 1.00    Years: 50.00    Pack years: 50.00    Types: Cigarettes  . Smokeless tobacco: Never Used  Substance Use Topics   . Alcohol use: No    Alcohol/week: 0.0 standard drinks  . Drug use: No    Types: Marijuana, "Crack" cocaine    Comment: 09/28/2013 "no marijuana since 2011, no crack/cocaine 1989"     Allergies   Iohexol; Ivp dye [iodinated diagnostic agents]; Abilify [aripiprazole]; and Morphine sulfate   Review of Systems Review of Systems  All other systems reviewed and are negative.    Physical Exam Updated Vital Signs BP 128/74 (BP Location: Left Arm)   Pulse 72   Temp 98.4 F (36.9 C) (Oral)   Resp 16   Ht 5\' 7"  (1.702 m)   Wt 116.6 kg   SpO2 98%   BMI 40.25 kg/m   Physical Exam Vitals signs and nursing note reviewed.  Constitutional:      Appearance: Normal appearance.  HENT:     Head: Normocephalic and atraumatic.  Pulmonary:     Effort: Pulmonary effort is normal.  Musculoskeletal:     Comments: The incision of the left below the knee amputation appears to be healing well.  There is no significant erythema or purulent drainage.  One area of the incision between 2 of the sutures has separated slightly and this appears to be the source of the bleeding.  Bleeding is controlled at present.  Skin:    General: Skin is warm and dry.  Neurological:     Mental Status: She is alert and oriented to person, place, and time.          ED Treatments / Results  Labs (all labs ordered are listed, but only abnormal results are displayed) Labs Reviewed - No data to display  EKG None  Radiology No results found.  Procedures Procedures (including critical care time)  Medications Ordered in ED Medications - No data to display   Initial Impression / Assessment and Plan / ED Course  I have reviewed the triage vital signs and the nursing notes.  Pertinent labs & imaging results that were available during my care of the patient were reviewed by me and considered in my medical decision making (see chart for details).  Patient presents with bleeding from her surgical incision  from her left below the knee amputation.  This started after a fall.  Bleeding has been controlled here in the ER.  Combat gauze was applied as well as an ABD dressing and Kerlix.  Patient and nurse at the rehab facility concerned about infection.  I see minimal redness and doubt this to be the case, however the patient will be given Rocephin here in the ER along with Keflex upon discharge.  She is to follow-up with Dr. Sharol Given if she experiences any additional problems.  Final Clinical Impressions(s) / ED Diagnoses   Final diagnoses:  None    ED Discharge Orders    None       Veryl Speak, MD 04/12/19 2245

## 2019-04-12 NOTE — Discharge Instructions (Signed)
Leave gauze and dressing in place for the next 24 hours, then change as previously directed.  Keflex as prescribed.  Return to the emergency department if you experience additional bleeding, fevers, or other new and concerning symptoms.

## 2019-04-13 DIAGNOSIS — I959 Hypotension, unspecified: Secondary | ICD-10-CM | POA: Diagnosis not present

## 2019-04-13 DIAGNOSIS — Z48817 Encounter for surgical aftercare following surgery on the skin and subcutaneous tissue: Secondary | ICD-10-CM | POA: Diagnosis not present

## 2019-04-13 DIAGNOSIS — Z4781 Encounter for orthopedic aftercare following surgical amputation: Secondary | ICD-10-CM | POA: Diagnosis not present

## 2019-04-13 DIAGNOSIS — Z7401 Bed confinement status: Secondary | ICD-10-CM | POA: Diagnosis not present

## 2019-04-13 DIAGNOSIS — L271 Localized skin eruption due to drugs and medicaments taken internally: Secondary | ICD-10-CM | POA: Diagnosis not present

## 2019-04-13 DIAGNOSIS — M2550 Pain in unspecified joint: Secondary | ICD-10-CM | POA: Diagnosis not present

## 2019-04-13 DIAGNOSIS — N390 Urinary tract infection, site not specified: Secondary | ICD-10-CM | POA: Diagnosis not present

## 2019-04-13 DIAGNOSIS — E1169 Type 2 diabetes mellitus with other specified complication: Secondary | ICD-10-CM | POA: Diagnosis not present

## 2019-04-13 DIAGNOSIS — S88112A Complete traumatic amputation at level between knee and ankle, left lower leg, initial encounter: Secondary | ICD-10-CM | POA: Diagnosis not present

## 2019-04-13 DIAGNOSIS — L03119 Cellulitis of unspecified part of limb: Secondary | ICD-10-CM | POA: Diagnosis not present

## 2019-04-13 DIAGNOSIS — G4733 Obstructive sleep apnea (adult) (pediatric): Secondary | ICD-10-CM | POA: Diagnosis not present

## 2019-04-13 DIAGNOSIS — I5030 Unspecified diastolic (congestive) heart failure: Secondary | ICD-10-CM | POA: Diagnosis not present

## 2019-04-13 DIAGNOSIS — M4696 Unspecified inflammatory spondylopathy, lumbar region: Secondary | ICD-10-CM | POA: Diagnosis not present

## 2019-04-13 DIAGNOSIS — Z7409 Other reduced mobility: Secondary | ICD-10-CM | POA: Diagnosis not present

## 2019-04-13 DIAGNOSIS — J45909 Unspecified asthma, uncomplicated: Secondary | ICD-10-CM | POA: Diagnosis not present

## 2019-04-13 DIAGNOSIS — R319 Hematuria, unspecified: Secondary | ICD-10-CM | POA: Diagnosis not present

## 2019-04-13 DIAGNOSIS — I509 Heart failure, unspecified: Secondary | ICD-10-CM | POA: Diagnosis not present

## 2019-04-13 DIAGNOSIS — M6281 Muscle weakness (generalized): Secondary | ICD-10-CM | POA: Diagnosis not present

## 2019-04-13 DIAGNOSIS — T8130XA Disruption of wound, unspecified, initial encounter: Secondary | ICD-10-CM | POA: Diagnosis not present

## 2019-04-13 DIAGNOSIS — R296 Repeated falls: Secondary | ICD-10-CM | POA: Diagnosis not present

## 2019-04-13 DIAGNOSIS — J449 Chronic obstructive pulmonary disease, unspecified: Secondary | ICD-10-CM | POA: Diagnosis not present

## 2019-04-13 DIAGNOSIS — E1140 Type 2 diabetes mellitus with diabetic neuropathy, unspecified: Secondary | ICD-10-CM | POA: Diagnosis not present

## 2019-04-13 DIAGNOSIS — L309 Dermatitis, unspecified: Secondary | ICD-10-CM | POA: Diagnosis not present

## 2019-04-13 DIAGNOSIS — B951 Streptococcus, group B, as the cause of diseases classified elsewhere: Secondary | ICD-10-CM | POA: Diagnosis not present

## 2019-04-13 NOTE — ED Notes (Addendum)
PTAR at bedside to transport patient back to [facility]. Attempted to call report to facility, but did not get an answer. Discharge paperwork with patient. Patient verbalized permission for RN to sign for discharge.

## 2019-04-14 ENCOUNTER — Encounter: Payer: Self-pay | Admitting: Dietician

## 2019-04-14 ENCOUNTER — Ambulatory Visit: Payer: Medicare Other | Admitting: Dietician

## 2019-04-14 ENCOUNTER — Telehealth: Payer: Self-pay | Admitting: *Deleted

## 2019-04-14 DIAGNOSIS — R296 Repeated falls: Secondary | ICD-10-CM | POA: Diagnosis not present

## 2019-04-14 DIAGNOSIS — E1169 Type 2 diabetes mellitus with other specified complication: Secondary | ICD-10-CM | POA: Diagnosis not present

## 2019-04-14 DIAGNOSIS — S88112A Complete traumatic amputation at level between knee and ankle, left lower leg, initial encounter: Secondary | ICD-10-CM | POA: Diagnosis not present

## 2019-04-14 DIAGNOSIS — T8130XA Disruption of wound, unspecified, initial encounter: Secondary | ICD-10-CM | POA: Diagnosis not present

## 2019-04-14 NOTE — Telephone Encounter (Signed)
Called for pain med, referred to dr duda's office for this time post surgical. Do you agree?

## 2019-04-14 NOTE — Progress Notes (Signed)
Left voicemail for return call Jennifer [name], RD 04/14/2019 10:00 AM  Spoke with patient who is in rehab. She dos not have nay needs at this time. She'll call the office when she gets home. Jennifer [name], RD 04/18/2019 3:12 PM.

## 2019-04-15 DIAGNOSIS — S88112A Complete traumatic amputation at level between knee and ankle, left lower leg, initial encounter: Secondary | ICD-10-CM | POA: Diagnosis not present

## 2019-04-15 DIAGNOSIS — T8130XA Disruption of wound, unspecified, initial encounter: Secondary | ICD-10-CM | POA: Diagnosis not present

## 2019-04-15 DIAGNOSIS — E1169 Type 2 diabetes mellitus with other specified complication: Secondary | ICD-10-CM | POA: Diagnosis not present

## 2019-04-15 DIAGNOSIS — R296 Repeated falls: Secondary | ICD-10-CM | POA: Diagnosis not present

## 2019-04-15 NOTE — Telephone Encounter (Signed)
Yes. Dr Sharol Given to Rx until I get written or verbal notice from Sharol Given that her post op pain resolved and I can resume chronic therapy

## 2019-04-16 ENCOUNTER — Other Ambulatory Visit: Payer: Self-pay | Admitting: Internal Medicine

## 2019-04-16 ENCOUNTER — Telehealth: Payer: Self-pay | Admitting: Internal Medicine

## 2019-04-16 MED ORDER — OXYCODONE-ACETAMINOPHEN 5-325 MG PO TABS: 1.0000 | ORAL_TABLET | Freq: Three times a day (TID) | ORAL | 0 refills | Status: DC | PRN

## 2019-04-16 NOTE — Telephone Encounter (Signed)
   Reason for call:   I received a call from [name] at 10:20 AM indicating inquiring about oxycodone refill.   Pertinent Data:   Patient is on chronic opiate therapy for chronic pain secondary to diabetic neuropathy, phantom pain, and fibromyalgia.   She underwent a surgical procedure for dehiscence of left BKA on 4/10.  Reviewed Dr. Zenovia Jarred telephone note from yesterday 4/24 stating Dr. Sharol Given to prescribe opiates until written or verbal notice from him that she can resume therapy for chronic pain.  [place] database reviewed. It does not appear she was prescribed opiates for this procedure.   Assessment / Plan / Recommendations:   Discussed with Dr. [name], she will review patient's chart and refill prescription if appropriate.  I called patient back and made her aware of this.  As always, pt is advised that if symptoms worsen or new symptoms arise, they should go to an urgent care facility or to to ER for further evaluation.   Welford Roche, MD   04/16/2019, 12:25 PM

## 2019-04-18 NOTE — Telephone Encounter (Signed)
No list for follow up after lock down restrictions lifted.

## 2019-04-20 DIAGNOSIS — Z7409 Other reduced mobility: Secondary | ICD-10-CM | POA: Diagnosis not present

## 2019-04-20 DIAGNOSIS — M6281 Muscle weakness (generalized): Secondary | ICD-10-CM | POA: Diagnosis not present

## 2019-04-20 DIAGNOSIS — Z48817 Encounter for surgical aftercare following surgery on the skin and subcutaneous tissue: Secondary | ICD-10-CM | POA: Diagnosis not present

## 2019-04-22 DIAGNOSIS — T8130XA Disruption of wound, unspecified, initial encounter: Secondary | ICD-10-CM | POA: Diagnosis not present

## 2019-04-22 DIAGNOSIS — R296 Repeated falls: Secondary | ICD-10-CM | POA: Diagnosis not present

## 2019-04-22 DIAGNOSIS — E1169 Type 2 diabetes mellitus with other specified complication: Secondary | ICD-10-CM | POA: Diagnosis not present

## 2019-04-22 DIAGNOSIS — S88112A Complete traumatic amputation at level between knee and ankle, left lower leg, initial encounter: Secondary | ICD-10-CM | POA: Diagnosis not present

## 2019-04-26 ENCOUNTER — Telehealth: Payer: Self-pay

## 2019-04-26 NOTE — Telephone Encounter (Signed)
Please see below and advise.

## 2019-04-26 NOTE — Telephone Encounter (Signed)
Patient just received a prescription for 800 tablets of Percocet.  We will be unable to fill narcotic prescriptions from this office.

## 2019-04-26 NOTE — Telephone Encounter (Signed)
Patient would like a Rx for pain.  Patient had surgery on 04/01/2019, Revision, left BKA.  Cb# is [phone].  Please advise.  Thank you.

## 2019-04-27 ENCOUNTER — Telehealth: Payer: Self-pay

## 2019-04-27 ENCOUNTER — Telehealth: Payer: Self-pay | Admitting: Orthopedic Surgery

## 2019-04-27 DIAGNOSIS — Z7409 Other reduced mobility: Secondary | ICD-10-CM | POA: Diagnosis not present

## 2019-04-27 DIAGNOSIS — M6281 Muscle weakness (generalized): Secondary | ICD-10-CM | POA: Diagnosis not present

## 2019-04-27 DIAGNOSIS — Z48817 Encounter for surgical aftercare following surgery on the skin and subcutaneous tissue: Secondary | ICD-10-CM | POA: Diagnosis not present

## 2019-04-27 NOTE — Telephone Encounter (Signed)
Called and sw nurse to advise that the pt had surgery 04/01/19 and needs follow up in the office. The nurse states that the pt had a fall on 04/12/19 and was sent to the ER for eval. Pt's appt is for 05/04/19 at 1pm.

## 2019-04-27 NOTE — Telephone Encounter (Signed)
I called and sw pt to advise of message that I had left earlier. That we could not refill rx due to the rx she received on 04/16/19 for percocet 5/325 #100 pt states that she did not receive this rx because she is at a SNF. I spoke with nurse Elmyra Ricks at [facility] and she states that the pt receives lyrica tid but no other pain rx on file. Being that this rx was picked up from the [facility] on [address] we can not give rx at this time. I advised both nurse and pt that this can be discussed at office visit next Wednesday but that we can not do anything at this moment. Pt voiced understanding and will follow up in office next week.

## 2019-04-27 NOTE — Telephone Encounter (Signed)
Pt received 100 on 04/16/19 from Dr. [name] ( 30 day supply) lm on vm to advise to call with questions.

## 2019-04-27 NOTE — Telephone Encounter (Signed)
Patient returned call asked for a call back. The number to contact patient is [phone]

## 2019-04-29 DIAGNOSIS — S88112A Complete traumatic amputation at level between knee and ankle, left lower leg, initial encounter: Secondary | ICD-10-CM | POA: Diagnosis not present

## 2019-04-29 DIAGNOSIS — T8130XA Disruption of wound, unspecified, initial encounter: Secondary | ICD-10-CM | POA: Diagnosis not present

## 2019-04-29 DIAGNOSIS — L309 Dermatitis, unspecified: Secondary | ICD-10-CM | POA: Diagnosis not present

## 2019-05-04 ENCOUNTER — Ambulatory Visit (INDEPENDENT_AMBULATORY_CARE_PROVIDER_SITE_OTHER): Payer: Medicare Other | Admitting: Orthopedic Surgery

## 2019-05-04 ENCOUNTER — Encounter: Payer: Self-pay | Admitting: Family

## 2019-05-04 ENCOUNTER — Other Ambulatory Visit: Payer: Self-pay

## 2019-05-04 VITALS — Ht 67.0 in | Wt 257.0 lb

## 2019-05-04 DIAGNOSIS — T879 Unspecified complications of amputation stump: Secondary | ICD-10-CM

## 2019-05-04 NOTE — Progress Notes (Signed)
Office Visit Note   Patient: Jennifer [name]           Date of Birth: 07-30-1961           MRN: [id number] Visit Date: 05/04/2019              Requested by: Bartholomew Crews, MD [address] [place], [place] [address] PCP: Bartholomew Crews, MD  Chief Complaint  Patient presents with  . Left Leg - Routine Post Op    04/01/2019 left BKA revision  S/p fall direct impact SNF [facility]       HPI: Patient is a 58 year old woman who presents 4 weeks status post left transtibial amputation.  Patient went to the emergency room 2 weeks after surgery for a fall.  Patient is currently at [facility] facility.  Assessment & Plan: Visit Diagnoses:  1. Complication of amputation stump of left lower extremity ([place])     Plan: Patient was given a prescription for biotech for a stump shrinker as well as a K2 level prosthesis.  Once the residual limb is consolidated she can be fit for a prosthesis.  Her right calf measures 47 cm in circumference and patient was also given instructions for her to begin using a knee-high 15 to 20 mm compression stocking.  Follow-Up Instructions: Return in about 4 weeks (around 06/01/2019).   Ortho Exam  Patient is alert, oriented, no adenopathy, well-dressed, normal affect, normal respiratory effort. Examination the left transtibial amputation is healing quite well.  The sutures and Steri-Strips are removed.  There is no cellulitis no drainage no signs of infection.  Examination the right lower extremity patient has pitting edema of the entire right lower extremity.  Her calf measures 47 cm in circumference.  There is no redness no cellulitis no signs of infection.  Imaging: No results found. No images are attached to the encounter.  Labs: Lab Results  Component Value Date   HGBA1C 9.3 (A) 02/22/2019   HGBA1C 8.2 (A) 10/21/2018   HGBA1C 8.8 04/08/2018   ESRSEDRATE 84 (H) 01/27/2013   ESRSEDRATE 65 (H) 01/26/2013   ESRSEDRATE 15  08/13/2009   CRP 31.2 (H) 01/27/2013   REPTSTATUS 01/20/2017 FINAL 01/15/2017   GRAMSTAIN  01/15/2017    RARE WBC PRESENT,BOTH PMN AND MONONUCLEAR NO ORGANISMS SEEN    CULT NORMAL SKIN FLORA NO ANAEROBES ISOLATED  01/15/2017   LABORGA STAPHYLOCOCCUS AUREUS 01/26/2013   LABORGA GROUP B STREP(S.AGALACTIAE)ISOLATED 01/26/2013     Lab Results  Component Value Date   ALBUMIN 2.6 (L) 04/05/2019   ALBUMIN 3.1 (L) 01/14/2017   ALBUMIN 2.9 (L) 01/08/2017    Body mass index is 40.25 kg/m.  Orders:  No orders of the defined types were placed in this encounter.  No orders of the defined types were placed in this encounter.    Procedures: No procedures performed  Clinical Data: No additional findings.  ROS:  All other systems negative, except as noted in the HPI. Review of Systems  Objective: Vital Signs: Ht 5\' 7"  (1.702 m)   Wt 257 lb (116.6 kg)   BMI 40.25 kg/m   Specialty Comments:  No specialty comments available.  PMFS History: Patient Active Problem List   Diagnosis Date Noted  . Fatigue 04/06/2019  . 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 Dehiscence of amputation stump ([place])   . Oropharyngeal candidiasis 03/25/2019  . Cellulitis of left lower extremity 03/09/2019  . Vulvar cysts 01/27/2019  .  Urinary incontinence 05/13/2018  . OSA (obstructive sleep apnea) 06/12/2017  . Morbid obesity with BMI of 40.0-44.9, adult ([place]) 03/26/2017  . Toe amputation status, right 01/16/2017  . Thrombocytopenia ([employer]) 06/05/2016  . 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 Type 2 diabetes with nephropathy ([place]) 09/07/2015  . Uncontrolled diabetes mellitus with retinopathy, due to underlying condition, without macular edema ([place]) 09/05/2015  . Diabetic retinopathy ([place]) 09/05/2015  . Counseling regarding end of life decision making 06/14/2015  . Atherosclerosis of aorta ([place]) 04/04/2015  . Anemia 10/05/2014  . Chest pain 09/16/2013  . Chronic diastolic  heart failure ([place])   . S/P BKA (below knee amputation) unilateral ([place])   . Tobacco abuse   . Severe obesity (BMI >= 40) ([place]) 03/02/2013  . Abnormality of gait 03/01/2013  . Healthcare maintenance 07/10/2012  . Chronic prescription opiate use 12/03/2011  . Secondary diabetes mellitus with peripheral vascular disease ([place]) 08/27/2011  . 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 Hypertension associated with diabetes ([place]) 11/29/2009  . Chronic insomnia 10/25/2009  . GASTROESOPHAGEAL REFLUX DISEASE 11/24/2008  . Depression, major, severe recurrence ([place]) 04/06/2008  . DM (diabetes mellitus) type II uncontrolled, periph vascular disorder ([place]) 04/02/2007  . Hyperlipidemia associated with type 2 diabetes mellitus ([place]) 01/08/2007  . COPD (chronic obstructive pulmonary disease) ([place]) 01/08/2007   Past Medical History:  Diagnosis Date  . Anginal pain [facility])    '3' of 10 ischemia ruled out 9/9   . Arthritis of lumbar spine   . Asthma   . Cataract   . CHF (congestive heart failure) ([place])   . 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 Chronic bronchitis ([place])    "I get it alot" (09/28/2013)  . Chronic diastolic heart failure (HCC)    grade 2 per 2D echocardiogram (01/2013)  . Chronic lower back pain   . Chronic osteomyelitis of foot (HCC)    chronic, right secondary to diabetic foot ulcers  . Chronic pain syndrome 12/03/2011   Likely secondary to depression, "fibromyalgia", neuropathy, and obesity. Lumbar MRI 2014 no sig change from prior (2008) : Stable hypertrophic facet disease most notable at L4-5. Stable shallow left foraminal/extraforaminal disc protrusion at L4-5. No direct neural compression.      Marland Kitchen COPD 01/08/2007   PFT's 05/2007 : FEV1/FVC 82, FEV1 64% pred, FEF 25-75% 40% predicted, 16% improvement in FEV1 with bronchodilators.     . Depression   . Diabetes mellitus without complication ([place])    Type II  . Diabetic peripheral neuropathy ([place])   .  DVT of upper extremity (deep vein thrombosis) ([place]) 03/11/2013   Secondary to PICC line. Right brachial vein, diagnosed on 03/10/2013 Coumadin for 3 months. End date 06/10/2013   . Dyspnea    "smoker"  . Environmental allergies    Hx: of  . Exertional shortness of breath   . Fatty liver 2003   observed on ultrasound abdomen  . Fibromyalgia   . GERD (gastroesophageal reflux disease)   . Glaucoma   . History of use of hearing aid   . Hyperlipidemia   . Hyperplastic colon polyp 12/2010   Per colonoscopy (12/2010) - Dr. Deatra Ina  . Hypertension   . 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 Blood cultures 01/26/13 S. Aureus and GBS. Blood cultures Feb 6th, 8th, and 9th and March were negative.Repeat TEE 3/20 negative for vegitations  . 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 2/2 tobacco use, DM, and cont weight bearing on surgical wound and developed gangrene   . Pneumonia   . Polymicrobial bacterial infection 01/2013   GBS and S. aureus bacteremia // Source likely infected diabetic foot ulcer  . PVD (peripheral vascular disease) with claudication ([place])    Stents to bilateral common iliac arteries (left 2005, right 2008), on chronic plavix  . Rheumatoid arthritis ([place])   . Tobacco abuse   . Type II diabetes mellitus with peripheral circulatory disorders, uncontrolled DX: 1993   Insulin dep. Poor control. Complicated by diabetic foot ulcer and diabetic eye disease.    Marland Kitchen Ulcer of foot, chronic (HCC)    Left. No OM per MRI (01/2013)    Family History  Problem Relation Age of Onset  . Diverticulosis Mother   . Diabetes Mother   . Hypertension Mother   . Congestive Heart Failure Mother   . Asthma Father   . CAD Sister 39       MI at age 68 per patient.  However, she has not had a stent or CABG.   . Heart disease Sister        before age 40  . Breast cancer Neg Hx       Past Surgical History:  Procedure Laterality Date  . ABDOMINAL HYSTERECTOMY  1997   secondary to uterine fibroids  . AMPUTATION Left 08/31/2013   Procedure: AMPUTATION RAY;  Surgeon: Newt Minion, MD;  Location: [facility];  Service: Orthopedics;  Laterality: Left;  Left Foot 5th Ray Amputation  . AMPUTATION Left 09/28/2013   Procedure: Left Midfoot amputation;  Surgeon: Newt Minion, MD;  Location: [facility];  Service: Orthopedics;  Laterality: Left;  Left Midfoot amputation  . AMPUTATION Left 10/14/2013   Procedure: AMPUTATION BELOW KNEE- left;  Surgeon: Newt Minion, MD;  Location: [place] [facility];  Service: Orthopedics;  Laterality: Left;  Left Below Knee Amputation   . AMPUTATION TOE Right 01/15/2017   Procedure: AMPUTATION 5th TOE RIGHT FOOT;  Surgeon: Edrick Kins, DPM;  Location: [facility];  Service: Podiatry;  Laterality: Right;  . APPLICATION OF WOUND VAC  04/01/2019   Procedure: Application Of Wound Vac;  Surgeon: Newt Minion, MD;  Location: [facility];  Service: Orthopedics;;  . BLADDER SURGERY     bladder reconstruction surgery  . BREAST BIOPSY     multiple-benign per pt  . COLONOSCOPY    . ESOPHAGOGASTRODUODENOSCOPY N/A 09/20/2013   Procedure: ESOPHAGOGASTRODUODENOSCOPY (EGD);  Surgeon: Jerene Bears, MD;  Location: [facility];  Service: Gastroenterology;  Laterality: N/A;  . FOOT AMPUTATION THROUGH METATARSAL Left 09/28/2013  . GANGLION CYST EXCISION     multiple  . PERIPHERAL VASCULAR INTERVENTION     stents in lower ext  . SHOULDER ARTHROSCOPY W/ ROTATOR CUFF REPAIR Bilateral   . SKIN SPLIT GRAFT Bilateral 05/13/2013   Procedure: Right and Left Foot Allograft Skin Graft;  Surgeon: Newt Minion, MD;  Location: [facility];  Service: Orthopedics;  Laterality: Bilateral;  Right and Left Foot Allograft Skin Graft  . STUMP REVISION Left 04/01/2019   Procedure: REVISION LEFT BELOW KNEE AMPUTATION;  Surgeon: Newt Minion, MD;  Location: [facility];  Service: Orthopedics;  Laterality: Left;  . TEE WITHOUT  CARDIOVERSION N/A 01/31/2013   Procedure: TRANSESOPHAGEAL ECHOCARDIOGRAM (TEE);  Surgeon: Fay Records, MD;  Location: [facility];  Service: Cardiovascular;  Laterality: N/A;  Rm [phone]  . TEE WITHOUT CARDIOVERSION N/A  03/10/2013   Procedure: TRANSESOPHAGEAL ECHOCARDIOGRAM (TEE);  Surgeon: Larey Dresser, MD;  Location: [facility];  Service: Cardiovascular;  Laterality: N/A;  Rm. [phone]  . TOE AMPUTATION Left 08/31/2013   4TH & 5 TH TOE   . TONSILLECTOMY    . TUBAL LIGATION    . WRIST SURGERY Right    "for tumors" (09/28/2013)   Social History   Occupational History  . Occupation: Disability    Comment: previously worked as a Engineer, materials  . Smoking status: Current Every Day Smoker    Packs/day: 1.00    Years: 50.00    Pack years: 50.00    Types: Cigarettes  . Smokeless tobacco: Never Used  Substance and Sexual Activity  . Alcohol use: No    Alcohol/week: 0.0 standard drinks  . Drug use: No    Types: Marijuana, "Crack" cocaine    Comment: 09/28/2013 "no marijuana since 2011, no crack/cocaine 1989"  . Sexual activity: Not Currently

## 2019-05-05 ENCOUNTER — Other Ambulatory Visit: Payer: Self-pay | Admitting: Internal Medicine

## 2019-05-05 DIAGNOSIS — L271 Localized skin eruption due to drugs and medicaments taken internally: Secondary | ICD-10-CM | POA: Diagnosis not present

## 2019-05-06 DIAGNOSIS — I5030 Unspecified diastolic (congestive) heart failure: Secondary | ICD-10-CM | POA: Diagnosis not present

## 2019-05-06 DIAGNOSIS — L309 Dermatitis, unspecified: Secondary | ICD-10-CM | POA: Diagnosis not present

## 2019-05-06 DIAGNOSIS — E1169 Type 2 diabetes mellitus with other specified complication: Secondary | ICD-10-CM | POA: Diagnosis not present

## 2019-05-06 DIAGNOSIS — S88112A Complete traumatic amputation at level between knee and ankle, left lower leg, initial encounter: Secondary | ICD-10-CM | POA: Diagnosis not present

## 2019-05-06 NOTE — Telephone Encounter (Signed)
pls call in lyrica

## 2019-05-06 NOTE — Telephone Encounter (Signed)
Lyrica called to [place] at [facility]. [name], RN, BSN

## 2019-05-10 DIAGNOSIS — Z794 Long term (current) use of insulin: Secondary | ICD-10-CM | POA: Diagnosis not present

## 2019-05-10 DIAGNOSIS — G894 Chronic pain syndrome: Secondary | ICD-10-CM | POA: Diagnosis not present

## 2019-05-10 DIAGNOSIS — E1139 Type 2 diabetes mellitus with other diabetic ophthalmic complication: Secondary | ICD-10-CM | POA: Diagnosis not present

## 2019-05-10 DIAGNOSIS — I700 Atherosclerosis of aorta: Secondary | ICD-10-CM | POA: Diagnosis not present

## 2019-05-10 DIAGNOSIS — E1165 Type 2 diabetes mellitus with hyperglycemia: Secondary | ICD-10-CM | POA: Diagnosis not present

## 2019-05-10 DIAGNOSIS — E11319 Type 2 diabetes mellitus with unspecified diabetic retinopathy without macular edema: Secondary | ICD-10-CM | POA: Diagnosis not present

## 2019-05-10 DIAGNOSIS — T879 Unspecified complications of amputation stump: Secondary | ICD-10-CM | POA: Diagnosis not present

## 2019-05-10 DIAGNOSIS — Z9181 History of falling: Secondary | ICD-10-CM | POA: Diagnosis not present

## 2019-05-10 DIAGNOSIS — I152 Hypertension secondary to endocrine disorders: Secondary | ICD-10-CM | POA: Diagnosis not present

## 2019-05-10 DIAGNOSIS — T8781 Dehiscence of amputation stump: Secondary | ICD-10-CM | POA: Diagnosis not present

## 2019-05-10 DIAGNOSIS — M797 Fibromyalgia: Secondary | ICD-10-CM | POA: Diagnosis not present

## 2019-05-10 DIAGNOSIS — Z89512 Acquired absence of left leg below knee: Secondary | ICD-10-CM | POA: Diagnosis not present

## 2019-05-10 DIAGNOSIS — I5032 Chronic diastolic (congestive) heart failure: Secondary | ICD-10-CM | POA: Diagnosis not present

## 2019-05-10 DIAGNOSIS — E1121 Type 2 diabetes mellitus with diabetic nephropathy: Secondary | ICD-10-CM | POA: Diagnosis not present

## 2019-05-10 DIAGNOSIS — M4696 Unspecified inflammatory spondylopathy, lumbar region: Secondary | ICD-10-CM | POA: Diagnosis not present

## 2019-05-10 DIAGNOSIS — G4733 Obstructive sleep apnea (adult) (pediatric): Secondary | ICD-10-CM | POA: Diagnosis not present

## 2019-05-10 DIAGNOSIS — J449 Chronic obstructive pulmonary disease, unspecified: Secondary | ICD-10-CM | POA: Diagnosis not present

## 2019-05-10 DIAGNOSIS — E1151 Type 2 diabetes mellitus with diabetic peripheral angiopathy without gangrene: Secondary | ICD-10-CM | POA: Diagnosis not present

## 2019-05-10 DIAGNOSIS — E1140 Type 2 diabetes mellitus with diabetic neuropathy, unspecified: Secondary | ICD-10-CM | POA: Diagnosis not present

## 2019-05-10 DIAGNOSIS — H42 Glaucoma in diseases classified elsewhere: Secondary | ICD-10-CM | POA: Diagnosis not present

## 2019-05-10 DIAGNOSIS — Z89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): Secondary | ICD-10-CM | POA: Diagnosis not present

## 2019-05-11 ENCOUNTER — Emergency Department (HOSPITAL_COMMUNITY): Payer: Medicare Other

## 2019-05-11 ENCOUNTER — Inpatient Hospital Stay (HOSPITAL_COMMUNITY): Payer: Medicare Other

## 2019-05-11 ENCOUNTER — Encounter (HOSPITAL_COMMUNITY): Payer: Self-pay | Admitting: Pharmacy Technician

## 2019-05-11 ENCOUNTER — Inpatient Hospital Stay (HOSPITAL_COMMUNITY)
Admission: EM | Admit: 2019-05-11 | Discharge: 2019-05-14 | DRG: 092 | Disposition: A | Discharge status: Home Health | Payer: Medicare Other | Attending: Student in an Organized Health Care Education/Training Program | Admitting: Student in an Organized Health Care Education/Training Program

## 2019-05-11 DIAGNOSIS — Z720 Tobacco use: Secondary | ICD-10-CM | POA: Diagnosis not present

## 2019-05-11 DIAGNOSIS — Z89512 Acquired absence of left leg below knee: Secondary | ICD-10-CM | POA: Diagnosis not present

## 2019-05-11 DIAGNOSIS — R319 Hematuria, unspecified: Secondary | ICD-10-CM | POA: Diagnosis not present

## 2019-05-11 DIAGNOSIS — E11319 Type 2 diabetes mellitus with unspecified diabetic retinopathy without macular edema: Secondary | ICD-10-CM | POA: Diagnosis present

## 2019-05-11 DIAGNOSIS — Z833 Family history of diabetes mellitus: Secondary | ICD-10-CM

## 2019-05-11 DIAGNOSIS — I110 Hypertensive heart disease with heart failure: Secondary | ICD-10-CM | POA: Diagnosis present

## 2019-05-11 DIAGNOSIS — Z89519 Acquired absence of unspecified leg below knee: Secondary | ICD-10-CM

## 2019-05-11 DIAGNOSIS — Z6837 Body mass index (BMI) 37.0-37.9, adult: Secondary | ICD-10-CM

## 2019-05-11 DIAGNOSIS — R0902 Hypoxemia: Secondary | ICD-10-CM | POA: Diagnosis not present

## 2019-05-11 DIAGNOSIS — M069 Rheumatoid arthritis, unspecified: Secondary | ICD-10-CM | POA: Diagnosis present

## 2019-05-11 DIAGNOSIS — G92 Toxic encephalopathy: Secondary | ICD-10-CM | POA: Diagnosis not present

## 2019-05-11 DIAGNOSIS — N390 Urinary tract infection, site not specified: Secondary | ICD-10-CM

## 2019-05-11 DIAGNOSIS — M797 Fibromyalgia: Secondary | ICD-10-CM | POA: Diagnosis present

## 2019-05-11 DIAGNOSIS — K219 Gastro-esophageal reflux disease without esophagitis: Secondary | ICD-10-CM | POA: Diagnosis not present

## 2019-05-11 DIAGNOSIS — Z79899 Other long term (current) drug therapy: Secondary | ICD-10-CM | POA: Diagnosis not present

## 2019-05-11 DIAGNOSIS — J449 Chronic obstructive pulmonary disease, unspecified: Secondary | ICD-10-CM | POA: Diagnosis not present

## 2019-05-11 DIAGNOSIS — R0602 Shortness of breath: Secondary | ICD-10-CM | POA: Diagnosis not present

## 2019-05-11 DIAGNOSIS — H409 Unspecified glaucoma: Secondary | ICD-10-CM | POA: Diagnosis present

## 2019-05-11 DIAGNOSIS — T428X5A Adverse effect of antiparkinsonism drugs and other central muscle-tone depressants, initial encounter: Secondary | ICD-10-CM | POA: Diagnosis present

## 2019-05-11 DIAGNOSIS — E119 Type 2 diabetes mellitus without complications: Secondary | ICD-10-CM | POA: Diagnosis present

## 2019-05-11 DIAGNOSIS — G4733 Obstructive sleep apnea (adult) (pediatric): Secondary | ICD-10-CM | POA: Diagnosis present

## 2019-05-11 DIAGNOSIS — F332 Major depressive disorder, recurrent severe without psychotic features: Secondary | ICD-10-CM | POA: Diagnosis present

## 2019-05-11 DIAGNOSIS — E1151 Type 2 diabetes mellitus with diabetic peripheral angiopathy without gangrene: Secondary | ICD-10-CM | POA: Diagnosis not present

## 2019-05-11 DIAGNOSIS — Z9981 Dependence on supplemental oxygen: Secondary | ICD-10-CM | POA: Diagnosis not present

## 2019-05-11 DIAGNOSIS — E785 Hyperlipidemia, unspecified: Secondary | ICD-10-CM | POA: Diagnosis present

## 2019-05-11 DIAGNOSIS — Z89511 Acquired absence of right leg below knee: Secondary | ICD-10-CM

## 2019-05-11 DIAGNOSIS — S6992XA Unspecified injury of left wrist, hand and finger(s), initial encounter: Secondary | ICD-10-CM | POA: Diagnosis not present

## 2019-05-11 DIAGNOSIS — B952 Enterococcus as the cause of diseases classified elsewhere: Secondary | ICD-10-CM | POA: Diagnosis present

## 2019-05-11 DIAGNOSIS — T426X5A Adverse effect of other antiepileptic and sedative-hypnotic drugs, initial encounter: Secondary | ICD-10-CM | POA: Diagnosis present

## 2019-05-11 DIAGNOSIS — R8271 Bacteriuria: Secondary | ICD-10-CM | POA: Diagnosis not present

## 2019-05-11 DIAGNOSIS — T43215A Adverse effect of selective serotonin and norepinephrine reuptake inhibitors, initial encounter: Secondary | ICD-10-CM | POA: Diagnosis present

## 2019-05-11 DIAGNOSIS — T43595A Adverse effect of other antipsychotics and neuroleptics, initial encounter: Secondary | ICD-10-CM | POA: Diagnosis present

## 2019-05-11 DIAGNOSIS — F17200 Nicotine dependence, unspecified, uncomplicated: Secondary | ICD-10-CM | POA: Diagnosis present

## 2019-05-11 DIAGNOSIS — Z90710 Acquired absence of both cervix and uterus: Secondary | ICD-10-CM | POA: Diagnosis not present

## 2019-05-11 DIAGNOSIS — E1165 Type 2 diabetes mellitus with hyperglycemia: Secondary | ICD-10-CM | POA: Diagnosis present

## 2019-05-11 DIAGNOSIS — N179 Acute kidney failure, unspecified: Secondary | ICD-10-CM | POA: Diagnosis not present

## 2019-05-11 DIAGNOSIS — Z1159 Encounter for screening for other viral diseases: Secondary | ICD-10-CM | POA: Diagnosis not present

## 2019-05-11 DIAGNOSIS — I5032 Chronic diastolic (congestive) heart failure: Secondary | ICD-10-CM | POA: Diagnosis not present

## 2019-05-11 DIAGNOSIS — F324 Major depressive disorder, single episode, in partial remission: Secondary | ICD-10-CM | POA: Diagnosis present

## 2019-05-11 DIAGNOSIS — Z8249 Family history of ischemic heart disease and other diseases of the circulatory system: Secondary | ICD-10-CM | POA: Diagnosis not present

## 2019-05-11 DIAGNOSIS — E1142 Type 2 diabetes mellitus with diabetic polyneuropathy: Secondary | ICD-10-CM | POA: Diagnosis not present

## 2019-05-11 DIAGNOSIS — E11621 Type 2 diabetes mellitus with foot ulcer: Secondary | ICD-10-CM | POA: Diagnosis present

## 2019-05-11 DIAGNOSIS — Z86718 Personal history of other venous thrombosis and embolism: Secondary | ICD-10-CM

## 2019-05-11 DIAGNOSIS — M25532 Pain in left wrist: Secondary | ICD-10-CM | POA: Diagnosis not present

## 2019-05-11 DIAGNOSIS — G894 Chronic pain syndrome: Secondary | ICD-10-CM | POA: Diagnosis present

## 2019-05-11 DIAGNOSIS — K760 Fatty (change of) liver, not elsewhere classified: Secondary | ICD-10-CM | POA: Diagnosis present

## 2019-05-11 DIAGNOSIS — R296 Repeated falls: Secondary | ICD-10-CM | POA: Diagnosis present

## 2019-05-11 DIAGNOSIS — Z794 Long term (current) use of insulin: Secondary | ICD-10-CM | POA: Diagnosis not present

## 2019-05-11 DIAGNOSIS — R4182 Altered mental status, unspecified: Secondary | ICD-10-CM | POA: Diagnosis present

## 2019-05-11 DIAGNOSIS — E118 Type 2 diabetes mellitus with unspecified complications: Secondary | ICD-10-CM | POA: Diagnosis present

## 2019-05-11 DIAGNOSIS — Z7951 Long term (current) use of inhaled steroids: Secondary | ICD-10-CM

## 2019-05-11 DIAGNOSIS — I10 Essential (primary) hypertension: Secondary | ICD-10-CM | POA: Diagnosis not present

## 2019-05-11 DIAGNOSIS — Z79891 Long term (current) use of opiate analgesic: Secondary | ICD-10-CM | POA: Diagnosis not present

## 2019-05-11 LAB — COMPREHENSIVE METABOLIC PANEL
ALT: 25 U/L (ref 0–44)
AST: 19 U/L (ref 15–41)
Albumin: 3.5 g/dL (ref 3.5–5.0)
Alkaline Phosphatase: 84 U/L (ref 38–126)
Anion gap: 11 (ref 5–15)
BUN: 28 mg/dL — ABNORMAL HIGH (ref 6–20)
CO2: 26 mmol/L (ref 22–32)
Calcium: 9.7 mg/dL (ref 8.9–10.3)
Chloride: 103 mmol/L (ref 98–111)
Creatinine, Ser: 1.4 mg/dL — ABNORMAL HIGH (ref 0.44–1.00)
GFR calc Af Amer: 47 mL/min — ABNORMAL LOW (ref >60)
GFR calc non Af Amer: 41 mL/min — ABNORMAL LOW (ref >60)
Glucose, Bld: 298 mg/dL — ABNORMAL HIGH (ref 70–99)
Potassium: 4.1 mmol/L (ref 3.5–5.1)
Sodium: 140 mmol/L (ref 135–145)
Total Bilirubin: 0.4 mg/dL (ref 0.3–1.2)
Total Protein: 7.2 g/dL (ref 6.5–8.1)

## 2019-05-11 LAB — URINALYSIS, ROUTINE W REFLEX MICROSCOPIC
Bilirubin Urine: NEGATIVE
Glucose, UA: >=500 mg/dL — AB
Hgb urine dipstick: NEGATIVE
Ketones, ur: NEGATIVE mg/dL
Nitrite: NEGATIVE
Protein, ur: 30 mg/dL — AB
Specific Gravity, Urine: 1.016 (ref 1.005–1.030)
WBC, UA: >50 WBC/hpf — ABNORMAL HIGH (ref 0–5)
pH: 5 (ref 5.0–8.0)

## 2019-05-11 LAB — CBC WITH DIFFERENTIAL/PLATELET
Abs Immature Granulocytes: 0.04 10*3/uL (ref 0.00–0.07)
Basophils Absolute: 0.1 10*3/uL (ref 0.0–0.1)
Basophils Relative: 0 %
Eosinophils Absolute: 0.1 10*3/uL (ref 0.0–0.5)
Eosinophils Relative: 1 %
HCT: 37.8 % (ref 36.0–46.0)
Hemoglobin: 12.2 g/dL (ref 12.0–15.0)
Immature Granulocytes: 0 %
Lymphocytes Relative: 43 %
Lymphs Abs: 5.4 10*3/uL — ABNORMAL HIGH (ref 0.7–4.0)
MCH: 31.1 pg (ref 26.0–34.0)
MCHC: 32.3 g/dL (ref 30.0–36.0)
MCV: 96.4 fL (ref 80.0–100.0)
Monocytes Absolute: 0.7 10*3/uL (ref 0.1–1.0)
Monocytes Relative: 5 %
Neutro Abs: 6.3 10*3/uL (ref 1.7–7.7)
Neutrophils Relative %: 51 %
Platelets: 157 10*3/uL (ref 150–400)
RBC: 3.92 MIL/uL (ref 3.87–5.11)
RDW: 13.4 % (ref 11.5–15.5)
WBC: 12.6 10*3/uL — ABNORMAL HIGH (ref 4.0–10.5)
nRBC: 0 % (ref 0.0–0.2)

## 2019-05-11 LAB — POCT I-STAT EG7
Bicarbonate: 26.7 mmol/L (ref 20.0–28.0)
Calcium, Ion: 1.27 mmol/L (ref 1.15–1.40)
HCT: 36 % (ref 36.0–46.0)
Hemoglobin: 12.2 g/dL (ref 12.0–15.0)
O2 Saturation: 78 %
Potassium: 4 mmol/L (ref 3.5–5.1)
Sodium: 142 mmol/L (ref 135–145)
TCO2: 28 mmol/L (ref 22–32)
pCO2, Ven: 49.7 mmHg (ref 44.0–60.0)
pH, Ven: 7.337 (ref 7.250–7.430)
pO2, Ven: 46 mmHg — ABNORMAL HIGH (ref 32.0–45.0)

## 2019-05-11 LAB — SARS CORONAVIRUS 2 BY RT PCR (HOSPITAL ORDER, PERFORMED IN ~~LOC~~ HOSPITAL LAB): SARS Coronavirus 2: NEGATIVE

## 2019-05-11 LAB — LIPASE, BLOOD: Lipase: 22 U/L (ref 11–51)

## 2019-05-11 LAB — BRAIN NATRIURETIC PEPTIDE: B Natriuretic Peptide: 54.4 pg/mL (ref 0.0–100.0)

## 2019-05-11 MED ORDER — INSULIN ASPART 100 UNIT/ML ~~LOC~~ SOLN
0.0000 [IU] | Freq: Every day | SUBCUTANEOUS | Status: DC
  Administered 2019-05-13: 2 [IU] via SUBCUTANEOUS

## 2019-05-11 MED ORDER — ACETAMINOPHEN 650 MG RE SUPP: 650.0000 mg | Freq: Four times a day (QID) | RECTAL | Status: DC | PRN

## 2019-05-11 MED ORDER — SODIUM CHLORIDE 0.9 % IV SOLN
1.0000 g | Freq: Once | INTRAVENOUS | Status: AC
  Administered 2019-05-11: 1 g via INTRAVENOUS
  Filled 2019-05-11: qty 10

## 2019-05-11 MED ORDER — SODIUM CHLORIDE 0.9 % IV SOLN
INTRAVENOUS | Status: AC
  Administered 2019-05-12: via INTRAVENOUS

## 2019-05-11 MED ORDER — ATENOLOL 50 MG PO TABS
100.0000 mg | ORAL_TABLET | Freq: Every day | ORAL | Status: DC
  Administered 2019-05-12 – 2019-05-14 (×3): 100 mg via ORAL
  Filled 2019-05-11 (×3): qty 2

## 2019-05-11 MED ORDER — ONDANSETRON HCL 4 MG/2ML IJ SOLN: 4.0000 mg | Freq: Four times a day (QID) | INTRAMUSCULAR | Status: DC | PRN

## 2019-05-11 MED ORDER — ALBUTEROL SULFATE (2.5 MG/3ML) 0.083% IN NEBU: 2.5000 mg | INHALATION_SOLUTION | RESPIRATORY_TRACT | Status: DC | PRN

## 2019-05-11 MED ORDER — BUPROPION HCL ER (XL) 150 MG PO TB24
300.0000 mg | ORAL_TABLET | Freq: Every day | ORAL | Status: DC
  Administered 2019-05-12 – 2019-05-14 (×3): 300 mg via ORAL
  Filled 2019-05-11 (×3): qty 2

## 2019-05-11 MED ORDER — CEPHALEXIN 500 MG PO CAPS: 500.0000 mg | ORAL_CAPSULE | Freq: Four times a day (QID) | ORAL | 0 refills | Status: DC

## 2019-05-11 MED ORDER — AMLODIPINE BESYLATE 5 MG PO TABS: 5.0000 mg | ORAL_TABLET | Freq: Once | ORAL | Status: DC

## 2019-05-11 MED ORDER — SODIUM CHLORIDE 0.9 % IV BOLUS (SEPSIS)
500.0000 mL | Freq: Once | INTRAVENOUS | Status: AC
  Administered 2019-05-11: 500 mL via INTRAVENOUS

## 2019-05-11 MED ORDER — ACETAMINOPHEN 325 MG PO TABS
650.0000 mg | ORAL_TABLET | Freq: Four times a day (QID) | ORAL | Status: DC | PRN
  Filled 2019-05-11: qty 2

## 2019-05-11 MED ORDER — AMLODIPINE BESYLATE 10 MG PO TABS
10.0000 mg | ORAL_TABLET | Freq: Every day | ORAL | Status: DC
  Administered 2019-05-12 – 2019-05-14 (×3): 10 mg via ORAL
  Filled 2019-05-11 (×4): qty 1

## 2019-05-11 MED ORDER — UMECLIDINIUM-VILANTEROL 62.5-25 MCG/INH IN AEPB
1.0000 | INHALATION_SPRAY | Freq: Every day | RESPIRATORY_TRACT | Status: DC
  Administered 2019-05-12 – 2019-05-14 (×3): 1 via RESPIRATORY_TRACT
  Filled 2019-05-11: qty 14

## 2019-05-11 MED ORDER — ALBUTEROL SULFATE HFA 108 (90 BASE) MCG/ACT IN AERS: 1.0000 | INHALATION_SPRAY | RESPIRATORY_TRACT | Status: DC | PRN

## 2019-05-11 MED ORDER — FLUTICASONE PROPIONATE 50 MCG/ACT NA SUSP
1.0000 | Freq: Every day | NASAL | Status: DC
  Administered 2019-05-13 – 2019-05-14 (×2): 1 via NASAL
  Filled 2019-05-11: qty 16

## 2019-05-11 MED ORDER — DULOXETINE HCL 30 MG PO CPEP
30.0000 mg | ORAL_CAPSULE | Freq: Every day | ORAL | Status: DC
  Administered 2019-05-12 – 2019-05-14 (×3): 30 mg via ORAL
  Filled 2019-05-11 (×3): qty 1

## 2019-05-11 MED ORDER — INSULIN ASPART 100 UNIT/ML ~~LOC~~ SOLN
0.0000 [IU] | Freq: Three times a day (TID) | SUBCUTANEOUS | Status: DC
  Administered 2019-05-12 (×2): 3 [IU] via SUBCUTANEOUS
  Administered 2019-05-12 – 2019-05-13 (×4): 5 [IU] via SUBCUTANEOUS
  Administered 2019-05-14 (×2): 8 [IU] via SUBCUTANEOUS
  Administered 2019-05-14: 5 [IU] via SUBCUTANEOUS

## 2019-05-11 MED ORDER — PANTOPRAZOLE SODIUM 40 MG PO TBEC
40.0000 mg | DELAYED_RELEASE_TABLET | Freq: Every day | ORAL | Status: DC
  Administered 2019-05-12 – 2019-05-14 (×3): 40 mg via ORAL
  Filled 2019-05-11 (×3): qty 1

## 2019-05-11 MED ORDER — ONDANSETRON HCL 4 MG PO TABS: 4.0000 mg | ORAL_TABLET | Freq: Four times a day (QID) | ORAL | Status: DC | PRN

## 2019-05-11 MED ORDER — ENOXAPARIN SODIUM 40 MG/0.4ML ~~LOC~~ SOLN
40.0000 mg | Freq: Every day | SUBCUTANEOUS | Status: DC
  Administered 2019-05-12 – 2019-05-14 (×3): 40 mg via SUBCUTANEOUS
  Filled 2019-05-11 (×3): qty 0.4

## 2019-05-11 MED ORDER — IPRATROPIUM BROMIDE 0.02 % IN SOLN: 0.5000 mg | Freq: Four times a day (QID) | RESPIRATORY_TRACT | Status: DC | PRN

## 2019-05-11 MED ORDER — ROSUVASTATIN CALCIUM 20 MG PO TABS
20.0000 mg | ORAL_TABLET | Freq: Every day | ORAL | Status: DC
  Administered 2019-05-12 – 2019-05-13 (×3): 20 mg via ORAL
  Filled 2019-05-11 (×2): qty 1

## 2019-05-11 NOTE — ED Notes (Signed)
ED TO INPATIENT HANDOFF REPORT  ED Nurse Name and Phone #:  Treshon Stannard [phone]  S Name/Age/Gender Jennifer [name] 58 y.o. female Room/Bed: 027C/027C  Code Status   Code Status: Full Code  Home/SNF/Other Home Patient oriented to: self and place Is this baseline? No   Triage Complete: Triage complete  Chief Complaint [facility], AMS  Triage Note Pt bib ems from home where she lives with her son. Family called out due to AMS since coming home from rehab facility on Monday. 436 cbg. Fa,ily reports slow to respond and confusion. Oxygen saturations decreased en route to the 80's, placed on 2L [place] with improvement to 100%. Pt + for cough. 115/82, HR 82 RR 22, 99.818LAC. Pt states she does not feel well.    Allergies Allergies  Allergen Reactions  . Iohexol Other (See Comments)     Desc: IV CONTRAST CAUSE NEPHROPATHY IN 2007   . Ivp Dye [Iodinated Diagnostic Agents] Other (See Comments)    Desc: IV CONTRAST CAUSE NEPHROPATHY IN 2007  . Abilify [Aripiprazole] Other (See Comments)    Urinary freq Nov 2016  . Morphine Sulfate Itching and Rash    Level of Care/Admitting Diagnosis ED Disposition    ED Disposition Condition [facility] Area: [place] [[id number]]  Level of Care: Med-Surg [16]  Covid Evaluation: Screening Protocol (No Symptoms)  Diagnosis: Encephalopathy, toxic [208813]  Admitting Physician: Axel Filler [[id number]]  Attending Physician: Axel Filler [[id number]]  Estimated length of stay: past midnight tomorrow  Certification:: I certify this patient will need inpatient services for at least 2 midnights  PT Class (Do Not Modify): Inpatient [101]  PT Acc Code (Do Not Modify): Private [1]       B Medical/Surgery History Past Medical History:  Diagnosis Date  . Anginal pain [employer] [facility] [employer])    '3' of 10 ischemia ruled out 9/9   . Arthritis of lumbar spine   . Asthma   . Cataract   . CHF (congestive heart failure) ([place])   .  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 Chronic bronchitis ([place])    "I get it alot" (09/28/2013)  . Chronic diastolic heart failure (HCC)    grade 2 per 2D echocardiogram (01/2013)  . Chronic lower back pain   . Chronic osteomyelitis of foot (HCC)    chronic, right secondary to diabetic foot ulcers  . Chronic pain syndrome 12/03/2011   Likely secondary to depression, "fibromyalgia", neuropathy, and obesity. Lumbar MRI 2014 no sig change from prior (2008) : Stable hypertrophic facet disease most notable at L4-5. Stable shallow left foraminal/extraforaminal disc protrusion at L4-5. No direct neural compression.      Marland Kitchen COPD 01/08/2007   PFT's 05/2007 : FEV1/FVC 82, FEV1 64% pred, FEF 25-75% 40% predicted, 16% improvement in FEV1 with bronchodilators.     . Depression   . Diabetes mellitus without complication ([place])    Type II  . Diabetic peripheral neuropathy ([place])   . DVT of upper extremity (deep vein thrombosis) ([place]) 03/11/2013   Secondary to PICC line. Right brachial vein, diagnosed on 03/10/2013 Coumadin for 3 months. End date 06/10/2013   . Dyspnea    "smoker"  . Environmental allergies    Hx: of  . Exertional shortness of breath   . Fatty liver 2003   observed on ultrasound abdomen  . Fibromyalgia   . GERD (gastroesophageal reflux disease)   . Glaucoma   . History of use of  hearing aid   . Hyperlipidemia   . Hyperplastic colon polyp 12/2010   Per colonoscopy (12/2010) - Dr. Deatra Ina  . Hypertension   . 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 Blood cultures 01/26/13 S. Aureus and GBS. Blood cultures Feb 6th, 8th, and 9th and March were negative.Repeat TEE 3/20 negative for vegitations  . 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 2/2 tobacco use, DM, and cont weight bearing on surgical wound and developed gangrene   . Pneumonia   . Polymicrobial  bacterial infection 01/2013   GBS and S. aureus bacteremia // Source likely infected diabetic foot ulcer  . PVD (peripheral vascular disease) with claudication ([place])    Stents to bilateral common iliac arteries (left 2005, right 2008), on chronic plavix  . Rheumatoid arthritis ([place])   . Tobacco abuse   . Type II diabetes mellitus with peripheral circulatory disorders, uncontrolled DX: 1993   Insulin dep. Poor control. Complicated by diabetic foot ulcer and diabetic eye disease.    Marland Kitchen Ulcer of foot, chronic (HCC)    Left. No OM per MRI (01/2013)   Past Surgical History:  Procedure Laterality Date  . ABDOMINAL HYSTERECTOMY  1997   secondary to uterine fibroids  . AMPUTATION Left 08/31/2013   Procedure: AMPUTATION RAY;  Surgeon: Newt Minion, MD;  Location: [place];  Service: Orthopedics;  Laterality: Left;  Left Foot 5th Ray Amputation  . AMPUTATION Left 09/28/2013   Procedure: Left Midfoot amputation;  Surgeon: Newt Minion, MD;  Location: [place];  Service: Orthopedics;  Laterality: Left;  Left Midfoot amputation  . AMPUTATION Left 10/14/2013   Procedure: AMPUTATION BELOW KNEE- left;  Surgeon: Newt Minion, MD;  Location: [place];  Service: Orthopedics;  Laterality: Left;  Left Below Knee Amputation   . AMPUTATION TOE Right 01/15/2017   Procedure: AMPUTATION 5th TOE RIGHT FOOT;  Surgeon: Edrick Kins, DPM;  Location: [place];  Service: Podiatry;  Laterality: Right;  . APPLICATION OF WOUND VAC  04/01/2019   Procedure: Application Of Wound Vac;  Surgeon: Newt Minion, MD;  Location: [place];  Service: Orthopedics;;  . BLADDER SURGERY     bladder reconstruction surgery  . BREAST BIOPSY     multiple-benign per pt  . COLONOSCOPY    . ESOPHAGOGASTRODUODENOSCOPY N/A 09/20/2013   Procedure: ESOPHAGOGASTRODUODENOSCOPY (EGD);  Surgeon: Jerene Bears, MD;  Location: [place];  Service: Gastroenterology;  Laterality: N/A;  . FOOT AMPUTATION THROUGH METATARSAL Left 09/28/2013  . GANGLION CYST EXCISION      multiple  . PERIPHERAL VASCULAR INTERVENTION     stents in lower ext  . SHOULDER ARTHROSCOPY W/ ROTATOR CUFF REPAIR Bilateral   . SKIN SPLIT GRAFT Bilateral 05/13/2013   Procedure: Right and Left Foot Allograft Skin Graft;  Surgeon: Newt Minion, MD;  Location: [place];  Service: Orthopedics;  Laterality: Bilateral;  Right and Left Foot Allograft Skin Graft  . STUMP REVISION Left 04/01/2019   Procedure: REVISION LEFT BELOW KNEE AMPUTATION;  Surgeon: Newt Minion, MD;  Location: [place];  Service: Orthopedics;  Laterality: Left;  . TEE WITHOUT CARDIOVERSION N/A 01/31/2013   Procedure: TRANSESOPHAGEAL ECHOCARDIOGRAM (TEE);  Surgeon: Fay Records, MD;  Location: [place];  Service: Cardiovascular;  Laterality: N/A;  Rm [phone]  . TEE WITHOUT CARDIOVERSION N/A 03/10/2013   Procedure: TRANSESOPHAGEAL ECHOCARDIOGRAM (TEE);  Surgeon: Larey Dresser, MD;  Location: [facility];  Service: Cardiovascular;  Laterality: N/A;  Rm. [phone]  . TOE AMPUTATION Left 08/31/2013   4TH & 5 TH TOE   . TONSILLECTOMY    . TUBAL LIGATION    . WRIST SURGERY Right    "for tumors" (09/28/2013)     A IV Location/Drains/Wounds Patient Lines/Drains/Airways Status   Active Line/Drains/Airways    Name:   Placement date:   Placement time:   Site:   Days:   Peripheral IV 05/11/19 Left;Upper Arm   05/11/19    1800    Arm   less than 1   Peripheral IV 05/11/19 Right Antecubital   05/11/19    2225    Antecubital   less than 1   Negative Pressure Wound Therapy Leg Left   04/01/19    0958    --   40   Incision 05/13/13 Leg Right   05/13/13    1204     2189   Incision 09/28/13 Foot Left   09/28/13    1109     2051   Incision 10/14/13 Leg Left   10/14/13    1531     2035   Incision (Closed) 01/15/17 Leg Right   01/15/17    1832     846   Incision (Closed) 04/01/19 Leg   04/01/19    0808     40   Wound 03/09/13 Diabetic ulcer Foot Left   03/09/13    1130    Foot   2254   Wound 04/30/13 Other (Comment) Foot Left rolled edges of  wound with yellowish and greenish drainage   04/30/13    1000    Foot   2202   Wound 07/07/13 Diabetic ulcer Foot Right   07/07/13    1400    Foot   2134   Wound / Incision (Open or Dehisced) 05/03/14 Other (Comment) Leg Anterior;Left   05/03/14    2000    Leg   1834          Intake/Output Last 24 hours No intake or output data in the 24 hours ending 05/11/19 2308  Labs/Imaging Results for orders placed or performed during the hospital encounter of 05/11/19 (from the past 48 hour(s))  CBC with Differential     Status: Abnormal   Collection Time: 05/11/19  1:30 PM  Result Value Ref Range   WBC 12.6 (H) 4.0 - 10.5 K/uL   RBC 3.92 3.87 - 5.11 MIL/uL   Hemoglobin 12.2 12.0 - 15.0 g/dL   HCT 37.8 36.0 - 46.0 %   MCV 96.4 80.0 - 100.0 fL   MCH 31.1 26.0 - 34.0 pg   MCHC 32.3 30.0 - 36.0 g/dL   RDW 13.4 11.5 - 15.5 %   Platelets 157 150 - 400 K/uL   nRBC 0.0 0.0 - 0.2 %   Neutrophils Relative % 51 %   Neutro Abs 6.3 1.7 - 7.7 K/uL   Lymphocytes Relative 43 %   Lymphs Abs 5.4 (H) 0.7 - 4.0 K/uL   Monocytes Relative 5 %   Monocytes Absolute 0.7 0.1 - 1.0 K/uL   Eosinophils Relative 1 %   Eosinophils Absolute 0.1 0.0 - 0.5 K/uL   Basophils Relative 0 %   Basophils Absolute 0.1 0.0 - 0.1 K/uL   Immature Granulocytes 0 %   Abs Immature Granulocytes 0.04 0.00 - 0.07 K/uL    Comment: Performed at [facility], [address] [place], [place] [address]  Brain natriuretic peptide     Status: None   Collection Time: 05/11/19  1:30 PM  Result Value Ref Range   B Natriuretic Peptide 54.4 0.0 - 100.0 pg/mL    Comment: Performed at [facility] [address], [place], [place] [address]  SARS Coronavirus 2 (CEPHEID- Performed in [facility] hospital lab), Hosp Order     Status: None   Collection Time: 05/11/19  1:30 PM  Result Value Ref Range   SARS Coronavirus 2 NEGATIVE NEGATIVE    Comment: (NOTE) If result is NEGATIVE SARS-CoV-2 target nucleic acids are NOT  DETECTED. The SARS-CoV-2 RNA is generally detectable in upper and lower  respiratory specimens during the acute phase of infection. The lowest  concentration of SARS-CoV-2 viral copies this assay can detect is 250  copies / mL. A negative result does not preclude SARS-CoV-2 infection  and should not be used as the sole basis for treatment or other  patient management decisions.  A negative result may occur with  improper specimen collection / handling, submission of specimen other  than nasopharyngeal swab, presence of viral mutation(s) within the  areas targeted by this assay, and inadequate number of viral copies  (<250 copies / mL). A negative result must be combined with clinical  observations, patient history, and epidemiological information. If result is POSITIVE SARS-CoV-2 target nucleic acids are DETECTED. The SARS-CoV-2 RNA is generally detectable in upper and lower  respiratory specimens dur ing the acute phase of infection.  Positive  results are indicative of active infection with SARS-CoV-2.  Clinical  correlation with patient history and other diagnostic information is  necessary to determine patient infection status.  Positive results do  not rule out bacterial infection or co-infection with other viruses. If result is PRESUMPTIVE POSTIVE SARS-CoV-2 nucleic acids MAY BE PRESENT.   A presumptive positive result was obtained on the submitted specimen  and confirmed on repeat testing.  While 2019 novel coronavirus  (SARS-CoV-2) nucleic acids may be present in the submitted sample  additional confirmatory testing may be necessary for epidemiological  and / or clinical management purposes  to differentiate between  SARS-CoV-2 and other Sarbecovirus currently known to infect humans.  If clinically indicated additional testing with an alternate test  methodology [phone]) is advised. The SARS-CoV-2 RNA is generally  detectable in upper and lower respiratory sp ecimens during  the acute  phase of infection. The expected result is Negative. Fact Sheet for Patients:  StrictlyIdeas.no Fact Sheet for Healthcare Providers: BankingDealers.co.za This test is not yet approved or cleared by the [place] FDA and has been authorized for detection and/or diagnosis of SARS-CoV-2 by FDA under an Emergency Use Authorization (EUA).  This EUA will remain in effect (meaning this test can be used) for the duration of the COVID-19 declaration under Section 564(b)(1) of the Act, 21 U.S.C. section 360bbb-3(b)(1), unless the authorization is terminated or revoked sooner. Performed at [facility], [place] [address] [place], [place] [address]   Comprehensive metabolic panel     Status: Abnormal   Collection Time: 05/11/19  1:30 PM  Result Value Ref Range   Sodium 140 135 - 145 mmol/L   Potassium 4.1 3.5 - 5.1 mmol/L   Chloride 103 98 - 111 mmol/L   CO2 26 22 - 32 mmol/L   Glucose, Bld 298 (H) 70 - 99 mg/dL   BUN 28 (H) 6 - 20 mg/dL   Creatinine, Ser 1.40 (H) 0.44 - 1.00 mg/dL  Calcium 9.7 8.9 - 10.3 mg/dL   Total Protein 7.2 6.5 - 8.1 g/dL   Albumin 3.5 3.5 - 5.0 g/dL   AST 19 15 - 41 U/L   ALT 25 0 - 44 U/L   Alkaline Phosphatase 84 38 - 126 U/L   Total Bilirubin 0.4 0.3 - 1.2 mg/dL   GFR calc non Af Amer 41 (L) >60 mL/min   GFR calc Af Amer 47 (L) >60 mL/min   Anion gap 11 5 - 15    Comment: Performed at [facility] [address], [place], [place] [address]  Lipase, blood     Status: None   Collection Time: 05/11/19  1:30 PM  Result Value Ref Range   Lipase 22 11 - 51 U/L    Comment: Performed at [facility] [address], [place], [place] [address]  POCT I-Stat EG7     Status: Abnormal   Collection Time: 05/11/19  2:07 PM  Result Value Ref Range   pH, Ven 7.337 7.250 - 7.430   pCO2, Ven 49.7 44.0 - 60.0 mmHg   pO2, Ven 46.0 (H) 32.0 - 45.0 mmHg   Bicarbonate 26.7 20.0 - 28.0 mmol/L   TCO2  28 22 - 32 mmol/L   O2 Saturation 78.0 %   Sodium 142 135 - 145 mmol/L   Potassium 4.0 3.5 - 5.1 mmol/L   Calcium, Ion 1.27 1.15 - 1.40 mmol/L   HCT 36.0 36.0 - 46.0 %   Hemoglobin 12.2 12.0 - 15.0 g/dL   Patient temperature HIDE    Sample type VENOUS   Urinalysis, Routine w reflex microscopic     Status: Abnormal   Collection Time: 05/11/19  2:46 PM  Result Value Ref Range   Color, Urine YELLOW YELLOW   APPearance CLOUDY (A) CLEAR   Specific Gravity, Urine 1.016 1.005 - 1.030   pH 5.0 5.0 - 8.0   Glucose, UA >=500 (A) NEGATIVE mg/dL   Hgb urine dipstick NEGATIVE NEGATIVE   Bilirubin Urine NEGATIVE NEGATIVE   Ketones, ur NEGATIVE NEGATIVE mg/dL   Protein, ur 30 (A) NEGATIVE mg/dL   Nitrite NEGATIVE NEGATIVE   Leukocytes,Ua LARGE (A) NEGATIVE   RBC / HPF 0-5 0 - 5 RBC/hpf   WBC, UA >50 (H) 0 - 5 WBC/hpf   Bacteria, UA MANY (A) NONE SEEN   Squamous Epithelial / LPF 0-5 0 - 5   WBC Clumps PRESENT    Budding Yeast PRESENT    Non Squamous Epithelial 0-5 (A) NONE SEEN    Comment: Performed at [facility], [place] [address], [place], [place] [address]   *Note: Due to a large number of results and/or encounters for the requested time period, some results have not been displayed. A complete set of results can be found in Results Review.   Dg Wrist Complete Left  Result Date: 05/11/2019 CLINICAL DATA:  Recent falls with left wrist pain, initial encounter EXAM: LEFT WRIST - COMPLETE 3+ VIEW COMPARISON:  None. FINDINGS: Mild separation of the scapholunate space is noted which may be related to mild ligamentous injury. No acute fracture or dislocation is seen. No gross soft tissue abnormality is noted. No other focal abnormality is seen. IMPRESSION: Mild widening of the scapholunate space which may be related to ligamentous injury. No acute fracture is seen. Electronically Signed   By: Inez Catalina M.D.   On: 05/11/2019 13:37   Ct Head Wo Contrast  Result Date: 05/11/2019 CLINICAL  DATA:  Altered mental status. EXAM: CT HEAD WITHOUT CONTRAST TECHNIQUE: Contiguous axial images were obtained from the base of the skull through the vertex without intravenous contrast. COMPARISON:  CT scan of January 06, 2017. FINDINGS: Brain: No evidence of acute infarction, hemorrhage, hydrocephalus, extra-axial collection or mass lesion/mass effect. Vascular: No hyperdense vessel or unexpected calcification. Skull: Normal. Negative for fracture or focal lesion. Sinuses/Orbits: No acute finding. Other: None. IMPRESSION: Normal head CT. Electronically Signed   By: Marijo Conception M.D.   On: 05/11/2019 14:17   Dg Chest Port 1 View  Result Date: 05/11/2019 CLINICAL DATA:  Shortness of breath. EXAM: PORTABLE CHEST 1 VIEW COMPARISON:  Radiographs of January 18, 2019. FINDINGS: The heart size and mediastinal contours are within normal limits. Both lungs are clear. No pneumothorax or pleural effusion is noted. The visualized skeletal structures are unremarkable. IMPRESSION: No active disease. Electronically Signed   By: Marijo Conception M.D.   On: 05/11/2019 13:34    Pending Labs Unresulted Labs (From admission, onward)    Start     Ordered   05/12/19 2725  Basic metabolic panel  Tomorrow morning,   R     05/11/19 2244   05/12/19 0500  CBC  Tomorrow morning,   R     05/11/19 2244   05/11/19 2243  HIV antibody (Routine Testing)  Once,   R     05/11/19 2244   05/11/19 1300  Urine culture  ONCE - STAT,   STAT     05/11/19 1300          Vitals/Pain Today's Vitals   05/11/19 1515 05/11/19 1600 05/11/19 1630 05/11/19 2055  BP: (!) 148/89 (!) 138/48    Pulse:  69 74   Resp:  11 15   Temp:      TempSrc:      SpO2:  100% 99%   PainSc:    0-No pain    Isolation Precautions Droplet and Contact precautions  Medications Medications  atenolol (TENORMIN) tablet 100 mg (has no administration in time range)  rosuvastatin (CRESTOR) tablet 20 mg (has no administration in time range)  buPROPion  (WELLBUTRIN XL) 24 hr tablet 300 mg (has no administration in time range)  DULoxetine (CYMBALTA) DR capsule 30 mg (has no administration in time range)  pantoprazole (PROTONIX) EC tablet 40 mg (has no administration in time range)  fluticasone (FLONASE) 50 MCG/ACT nasal spray 1 spray (has no administration in time range)  ipratropium (ATROVENT) nebulizer solution 0.5 mg (has no administration in time range)  umeclidinium-vilanterol (ANORO ELLIPTA) 62.5-25 MCG/INH 1 puff (has no administration in time range)  enoxaparin (LOVENOX) injection 40 mg (has no administration in time range)  0.9 %  sodium chloride infusion (has no administration in time range)  acetaminophen (TYLENOL) tablet 650 mg (has no administration in time range)    Or  acetaminophen (TYLENOL) suppository 650 mg (has no administration in time range)  ondansetron (ZOFRAN) tablet 4 mg (has no administration in time range)    Or  ondansetron (ZOFRAN) injection 4 mg (has no administration in time range)  insulin aspart (novoLOG) injection 0-15 Units (has no administration in time range)  insulin aspart (novoLOG) injection 0-5 Units (has no administration in time range)  albuterol (PROVENTIL) (2.5 MG/3ML) 0.083% nebulizer solution 2.5 mg (has no administration in time range)  amLODipine (NORVASC) tablet 10 mg (has no administration in time range)  sodium chloride 0.9 % bolus 500 mL (0 mLs Intravenous Stopped 05/11/19 1912)  cefTRIAXone (  ROCEPHIN) 1 g in sodium chloride 0.9 % 100 mL IVPB (0 g Intravenous Stopped 05/11/19 1912)    Mobility non-ambulatory High fall risk   Focused Assessments Neuro Assessment Handoff:  Swallow screen pass? Yes          Neuro Assessment:   Neuro Checks:      Last Documented NIHSS Modified Score:   Has TPA been given? No If patient is a Neuro Trauma and patient is going to OR before floor call report to [place] nurse: [phone] or [phone]     R Recommendations: See Admitting  Provider Note  Report given to:   Additional Notes:  Pt extremely weak, unable to help when moving from chair to bed.

## 2019-05-11 NOTE — ED Notes (Signed)
Pt will most likely need SNF placement

## 2019-05-11 NOTE — Discharge Instructions (Addendum)
Thank you for allowing me to care for you today. Please return to the emergency department if you have new or worsening symptoms. Take your medications as instructed.  ° °

## 2019-05-11 NOTE — ED Notes (Signed)
Updated pt son Jennifer [name] per pt request.

## 2019-05-11 NOTE — ED Provider Notes (Addendum)
[facility] EMERGENCY DEPARTMENT Provider Note   CSN: [id number] Arrival date & time: 05/11/19  1242    History   Chief Complaint No chief complaint on file.   HPI Jennifer [name] is a 58 y.o. female.     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  Per EMS report, patient recently came home from nursing home on Monday and has been living with her son.  Son reports that since she has been home she has been more slow to respond and somewhat confused.  Patient is reporting that she just overall does not feel well.  Reports that her left wrist hurts secondary to a fall.  Patient has not received her prosthetic leg at home and has been having more falls lately.  She reports that she has been coughing and having nausea, vomiting, diarrhea.  Denies any fever, chest pain.  Reports that her breathing has not felt normal either.  Denies any sick contacts that she knows of.  She is not on home oxygen     Past Medical History:  Diagnosis Date  . Anginal pain [facility])    '3' of 10 ischemia ruled out 9/9   . Arthritis of lumbar spine   . Asthma   . Cataract   . CHF (congestive heart failure) ([place])   . 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 Chronic bronchitis ([place])    "I get it alot" (09/28/2013)  . Chronic diastolic heart failure (HCC)    grade 2 per 2D echocardiogram (01/2013)  . Chronic lower back pain   . Chronic osteomyelitis of foot (HCC)    chronic, right secondary to diabetic foot ulcers  . Chronic pain syndrome 12/03/2011   Likely secondary to depression, "fibromyalgia", neuropathy, and obesity. Lumbar MRI 2014 no sig change from prior (2008) : Stable hypertrophic facet disease most notable at L4-5. Stable shallow left foraminal/extraforaminal disc protrusion at  L4-5. No direct neural compression.      Marland Kitchen COPD 01/08/2007   PFT's 05/2007 : FEV1/FVC 82, FEV1 64% pred, FEF 25-75% 40% predicted, 16% improvement in FEV1 with bronchodilators.     . Depression   . Diabetes mellitus without complication ([place])    Type II  . Diabetic peripheral neuropathy ([place])   . DVT of upper extremity (deep vein thrombosis) ([place]) 03/11/2013   Secondary to PICC line. Right brachial vein, diagnosed on 03/10/2013 Coumadin for 3 months. End date 06/10/2013   . Dyspnea    "smoker"  . Environmental allergies    Hx: of  . Exertional shortness of breath   . Fatty liver 2003   observed on ultrasound abdomen  . Fibromyalgia   . GERD (gastroesophageal reflux disease)   . Glaucoma   . History of use of hearing aid   . Hyperlipidemia   . Hyperplastic colon polyp 12/2010   Per colonoscopy (12/2010) - Dr. Deatra Ina  . Hypertension   . 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 Blood cultures 01/26/13 S. Aureus and GBS. Blood cultures Feb 6th, 8th, and 9th and March were negative.Repeat TEE 3/20 negative for vegitations  . 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 2/2 tobacco use, DM, and cont  weight bearing on surgical wound and developed gangrene   . Pneumonia   . Polymicrobial bacterial infection 01/2013   GBS and S. aureus bacteremia // Source likely infected diabetic foot ulcer  . PVD (peripheral vascular disease) with claudication ([place])    Stents to bilateral common iliac arteries (left 2005, right 2008), on chronic plavix  . Rheumatoid arthritis ([place])   . Tobacco abuse   . Type II diabetes mellitus with peripheral circulatory disorders, uncontrolled DX: 1993   Insulin dep. Poor control. Complicated by diabetic foot ulcer and diabetic eye disease.    Marland Kitchen Ulcer of foot, chronic (HCC)    Left. No OM per MRI (01/2013)    Patient Active Problem List   Diagnosis  Date Noted  . Encephalopathy, toxic 05/11/2019  . Fatigue 04/06/2019  . Oropharyngeal candidiasis 03/25/2019  . Vulvar cysts 01/27/2019  . Urinary incontinence 05/13/2018  . OSA (obstructive sleep apnea) 06/12/2017  . Morbid obesity with BMI of 40.0-44.9, adult ([place]) 03/26/2017  . Toe amputation status, right 01/16/2017  . Thrombocytopenia ([place]) 06/05/2016  . 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 Type 2 diabetes with nephropathy ([place]) 09/07/2015  . Uncontrolled diabetes mellitus with retinopathy, due to underlying condition, without macular edema ([place]) 09/05/2015  . Diabetic retinopathy ([place]) 09/05/2015  . Counseling regarding end of life decision making 06/14/2015  . Atherosclerosis of aorta ([place]) 04/04/2015  . Anemia 10/05/2014  . Chronic diastolic heart failure ([place])   . S/P BKA (below knee amputation) unilateral ([place])   . Tobacco abuse   . Severe obesity (BMI >= 40) ([place]) 03/02/2013  . Abnormality of gait 03/01/2013  . Healthcare maintenance 07/10/2012  . Chronic prescription opiate use 12/03/2011  . Secondary diabetes mellitus with peripheral vascular disease ([place]) 08/27/2011  . 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 ([place]) 11/29/2009  . Hypertension associated with diabetes ([place]) 11/29/2009  . Chronic insomnia 10/25/2009  . GASTROESOPHAGEAL REFLUX DISEASE 11/24/2008  . Depression, major, severe recurrence ([place]) 04/06/2008  . DM (diabetes mellitus) type II uncontrolled, periph vascular disorder ([place]) 04/02/2007  . Hyperlipidemia associated with type 2 diabetes mellitus ([place]) 01/08/2007  . COPD (chronic obstructive pulmonary disease) ([place]) 01/08/2007    Past Surgical History:  Procedure Laterality Date  . ABDOMINAL HYSTERECTOMY  1997   secondary to uterine fibroids  . AMPUTATION Left 08/31/2013   Procedure: AMPUTATION RAY;  Surgeon: Newt Minion, MD;  Location: [place];  Service: Orthopedics;  Laterality: Left;  Left Foot 5th Ray Amputation  . AMPUTATION Left 09/28/2013   Procedure:  Left Midfoot amputation;  Surgeon: Newt Minion, MD;  Location: [place];  Service: Orthopedics;  Laterality: Left;  Left Midfoot amputation  . AMPUTATION Left 10/14/2013   Procedure: AMPUTATION BELOW KNEE- left;  Surgeon: Newt Minion, MD;  Location: [place] [facility];  Service: Orthopedics;  Laterality: Left;  Left Below Knee Amputation   . AMPUTATION TOE Right 01/15/2017   Procedure: AMPUTATION 5th TOE RIGHT FOOT;  Surgeon: Edrick Kins, DPM;  Location: [facility];  Service: Podiatry;  Laterality: Right;  . APPLICATION OF WOUND VAC  04/01/2019   Procedure: Application Of Wound Vac;  Surgeon: Newt Minion, MD;  Location: [place];  Service: Orthopedics;;  . BLADDER SURGERY     bladder reconstruction surgery  . BREAST BIOPSY     multiple-benign per pt  . COLONOSCOPY    . ESOPHAGOGASTRODUODENOSCOPY N/A 09/20/2013   Procedure: ESOPHAGOGASTRODUODENOSCOPY (EGD);  Surgeon: Jerene Bears, MD;  Location: [facility];  Service: Gastroenterology;  Laterality:  N/A;  . FOOT AMPUTATION THROUGH METATARSAL Left 09/28/2013  . GANGLION CYST EXCISION     multiple  . PERIPHERAL VASCULAR INTERVENTION     stents in lower ext  . SHOULDER ARTHROSCOPY W/ ROTATOR CUFF REPAIR Bilateral   . SKIN SPLIT GRAFT Bilateral 05/13/2013   Procedure: Right and Left Foot Allograft Skin Graft;  Surgeon: Newt Minion, MD;  Location: [facility];  Service: Orthopedics;  Laterality: Bilateral;  Right and Left Foot Allograft Skin Graft  . STUMP REVISION Left 04/01/2019   Procedure: REVISION LEFT BELOW KNEE AMPUTATION;  Surgeon: Newt Minion, MD;  Location: [facility];  Service: Orthopedics;  Laterality: Left;  . TEE WITHOUT CARDIOVERSION N/A 01/31/2013   Procedure: TRANSESOPHAGEAL ECHOCARDIOGRAM (TEE);  Surgeon: Fay Records, MD;  Location: [facility];  Service: Cardiovascular;  Laterality: N/A;  Rm [phone]  . TEE WITHOUT CARDIOVERSION N/A 03/10/2013   Procedure: TRANSESOPHAGEAL ECHOCARDIOGRAM (TEE);  Surgeon: Larey Dresser, MD;  Location: [facility];   Service: Cardiovascular;  Laterality: N/A;  Rm. [phone]  . TOE AMPUTATION Left 08/31/2013   4TH & 5 TH TOE   . TONSILLECTOMY    . TUBAL LIGATION    . WRIST SURGERY Right    "for tumors" (09/28/2013)     OB History   No obstetric history on file.      Home Medications    Prior to Admission medications   Medication Sig Start Date End Date Taking? Authorizing Provider  acetaminophen (TYLENOL) 500 MG tablet Take 1 tablet (500 mg total) by mouth every 6 (six) hours as needed for mild pain. 04/06/19  Yes Newt Minion, MD  albuterol (PROVENTIL HFA;VENTOLIN HFA) 108 (90 Base) MCG/ACT inhaler Inhale 1-2 puffs into the lungs every 4 (four) hours as needed for wheezing or shortness of breath.   Yes [provider]  albuterol (PROVENTIL) (2.5 MG/3ML) 0.083% nebulizer solution Take 3 mLs (2.5 mg total) by nebulization every 6 (six) hours as needed for wheezing. 01/18/19  Yes Mesner, Corene Cornea, MD  amLODipine-benazepril (LOTREL) 10-40 MG capsule TAKE 1 CAPSULE BY MOUTH EVERY DAY 03/25/19  Yes Bartholomew Crews, MD  atenolol (TENORMIN) 100 MG tablet TAKE 1 TABLET BY MOUTH EVERY DAY Patient taking differently: Take 100 mg by mouth daily.  05/06/19  Yes Bartholomew Crews, MD  baclofen (LIORESAL) 10 MG tablet TAKE 1 TABLET BY MOUTH THREE TIMES A DAY AS NEEDED FOR MUSCLE SPASMS Patient taking differently: Take 10 mg by mouth 3 (three) times daily as needed for muscle spasms.  07/05/18  Yes Bartholomew Crews, MD  buPROPion (WELLBUTRIN XL) 300 MG 24 hr tablet TAKE 1 TABLET BY MOUTH DAILY Patient taking differently: Take 300 mg by mouth daily.  11/26/18  Yes Bartholomew Crews, MD  cholecalciferol (VITAMIN D) 1000 units tablet Take 1,000 Units by mouth daily.   Yes [provider]  DULoxetine (CYMBALTA) 30 MG capsule TAKE 1 CAPSULE BY MOUTH EVERY DAY Patient taking differently: Take 30 mg by mouth daily.  09/15/18  Yes Bartholomew Crews, MD  fesoterodine (TOVIAZ) 8 MG TB24 tablet Take 8  mg by mouth daily.  11/04/18  Yes Bartholomew Crews, MD  fluticasone [facility]) 50 MCG/ACT nasal spray INSTILL 1 SPRAY IN EACH NOSTRIL DAILY 03/30/19  Yes Bartholomew Crews, MD  furosemide (LASIX) 40 MG tablet TAKE ONE TABLET BY MOUTH DAILY AS NEEDED Patient taking differently: Take 40 mg by mouth daily as needed for fluid.  07/05/18  Yes Bartholomew Crews, MD  hydrochlorothiazide (HYDRODIURIL) 25 MG tablet TAKE 1 TABLET BY MOUTH ONCE DAILY Patient taking differently: Take 25 mg by mouth daily.  10/01/18  Yes Bartholomew Crews, MD  Insulin Degludec (TRESIBA FLEXTOUCH) 200 UNIT/ML SOPN Inject 66 Units into the skin daily. 01/27/19  Yes Bartholomew Crews, MD  ipratropium (ATROVENT) 0.02 % nebulizer solution USE 1 VIAL VIA NEBULIZER EVERY 6 HOURS AS NEEDED FOR WHEEZING Patient taking differently: Take 0.5 mg by nebulization every 6 (six) hours as needed for wheezing.  08/04/18  Yes Bartholomew Crews, MD  metFORMIN (GLUCOPHAGE-XR) 500 MG 24 hr tablet TAKE 1 TABLET BY MOUTH EVERY DAY WITH BREAKFAST Patient taking differently: Take 500 mg by mouth daily with breakfast.  05/06/19  Yes Bartholomew Crews, MD  mirabegron ER (MYRBETRIQ) 50 MG TB24 tablet Take 1 tablet (50 mg total) by mouth daily. 10/29/18  Yes Bartholomew Crews, MD  Multiple Vitamins-Minerals (MULTIVITAMIN WITH MINERALS) tablet Take 1 tablet by mouth daily.   Yes [provider]  nicotine (NICODERM CQ - DOSED IN MG/24 HOURS) 14 mg/24hr patch Place 14 mg onto the skin daily.   Yes [provider]  omeprazole (PRILOSEC) 20 MG capsule TAKE 1 CAPSULE (20 MG TOTAL) BY MOUTH 2 (TWO) TIMES DAILY BEFORE A MEAL. 05/06/19  Yes Bartholomew Crews, MD  ondansetron (ZOFRAN) 4 MG tablet Take 4 mg by mouth every 8 (eight) hours as needed for nausea or vomiting.   Yes [provider]  OXYGEN Inhale 2 L into the lungs as needed (to keep 02 at 93%).   Yes [provider]  pregabalin (LYRICA) 200 MG capsule  TAKE 1 CAPSULE BY MOUTH THREE TIMES A DAY Patient taking differently: Take 200 mg by mouth 3 (three) times daily.  05/06/19  Yes Bartholomew Crews, MD  rosuvastatin (CRESTOR) 20 MG tablet TAKE 1 TABLET BY MOUTH AT BEDTIME Patient taking differently: Take 20 mg by mouth daily.  05/06/19  Yes Bartholomew Crews, MD  traZODone (DESYREL) 100 MG tablet TAKE ONE TABLET BY MOUTH DAILY AT BEDTIME AS NEEDED FOR SLEEP Patient taking differently: Take 100 mg by mouth at bedtime as needed for sleep.  05/06/19  Yes Bartholomew Crews, MD  umeclidinium-vilanterol (ANORO ELLIPTA) 62.5-25 MCG/INH AEPB Inhale 1 puff into the lungs daily. 03/30/19  Yes Bartholomew Crews, MD  vitamin B-12 (CYANOCOBALAMIN) 100 MCG tablet Take 100 mcg by mouth daily.   Yes [provider]  vitamin C (ASCORBIC ACID) 500 MG tablet Take 500 mg by mouth daily.   Yes [provider]  betamethasone dipropionate (DIPROLENE) 0.05 % cream Apply 1 application topically 2 (two) times daily as needed (irritation).    [provider]  cephALEXin (KEFLEX) 500 MG capsule Take 1 capsule (500 mg total) by mouth 4 (four) times daily. 05/11/19   Alveria Apley, PA-C  doxycycline (DORYX) 100 MG EC tablet Take 100 mg by mouth 2 (two) times daily. DS 7 04/28/19   [provider]  EASY TOUCH PEN NEEDLES 31G X 8 MM MISC USE TO INJECT INSULIN TWICE DAILY 05/06/19   Bartholomew Crews, MD  hydrOXYzine (ATARAX/VISTARIL) 25 MG tablet TAKE 1 TABLET (25 MG TOTAL) BY MOUTH 3 (THREE) TIMES DAILY AS NEEDED FOR ITCHING. Patient not taking: Reported on 05/12/2019 07/05/18   Bartholomew Crews, MD  Lancets (ONETOUCH DELICA PLUS [id number]) [place] A DAY 05/06/19   Bartholomew Crews, MD  NYSTATIN powder APPLY TO AFFECTED  AREA THREE TIMES A DAY Patient not taking: Reported on 05/12/2019 05/06/19   Bartholomew Crews, MD  [facility] VERIO test strip USE 3-4 TIMES DAILY TO CHECK BLOOD SUGAR 05/06/19    Bartholomew Crews, MD  oxyCODONE-acetaminophen (PERCOCET/ROXICET) 5-325 MG tablet Take 1 tablet by mouth every 8 (eight) hours as needed for up to 30 days. May take a 4th pill if severe pain (up to 10 days monthly) #100 equals 30 day supply Patient not taking: Reported on 05/12/2019 04/16/19 05/16/19  Bartholomew Crews, MD  Potassium Chloride ER 20 MEQ TBCR Take 40 mEq by mouth daily. 01/02/14 01/23/14  Othella Boyer, MD    Family History Family History  Problem Relation Age of Onset  . Diverticulosis Mother   . Diabetes Mother   . Hypertension Mother   . Congestive Heart Failure Mother   . Asthma Father   . CAD Sister 46       MI at age 63 per patient.  However, she has not had a stent or CABG.   . Heart disease Sister        before age 53  . Breast cancer Neg Hx     Social History Social History   Tobacco Use  . Smoking status: Current Every Day Smoker    Packs/day: 1.00    Years: 50.00    Pack years: 50.00    Types: Cigarettes  . Smokeless tobacco: Never Used  Substance Use Topics  . Alcohol use: No    Alcohol/week: 0.0 standard drinks  . Drug use: No    Types: Marijuana, "Crack" cocaine    Comment: 09/28/2013 "no marijuana since 2011, no crack/cocaine 1989"     Allergies   Iohexol; Ivp dye [iodinated diagnostic agents]; Abilify [aripiprazole]; and Morphine sulfate   Review of Systems Review of Systems  Unable to perform ROS: Mental status change  Constitutional: Positive for appetite change. Negative for fatigue and fever.  HENT: Negative for congestion.   Respiratory: Positive for cough and shortness of breath.   Cardiovascular: Negative for chest pain, palpitations and leg swelling.  Gastrointestinal: Positive for diarrhea, nausea and vomiting. Negative for abdominal pain.  Genitourinary: Negative for dysuria.  Musculoskeletal: Positive for arthralgias. Negative for joint swelling.  Skin: Negative for wound.  Neurological: Negative for dizziness and  headaches.     Physical Exam Updated Vital Signs BP (!) 158/75 (BP Location: Left Arm)   Pulse 62   Temp (!) 97.5 F (36.4 C) (Oral)   Resp 11   Ht 5\' 7"  (1.702 m)   Wt 109.5 kg   SpO2 97%   BMI 37.81 kg/m   Physical Exam Vitals signs and nursing note reviewed. Exam conducted with a chaperone present.  Constitutional:      General: She is not in acute distress.    Appearance: She is obese. She is not ill-appearing or diaphoretic.     Comments: Patient appears to be wincing in pain  HENT:     Head: Normocephalic and atraumatic.     Nose: Nose normal.     Mouth/Throat:     Mouth: Mucous membranes are moist.  Eyes:     Conjunctiva/sclera: Conjunctivae normal.  Cardiovascular:     Rate and Rhythm: Normal rate and regular rhythm.  Pulmonary:     Effort: Pulmonary effort is normal.     Breath sounds: Normal breath sounds.  Abdominal:     General: Abdomen is flat. There is no distension.     Tenderness:  There is no abdominal tenderness. There is no guarding.  Musculoskeletal:     Comments: Tender to palpation of the left wrist without obvious deformity or signs of injury or trauma.  Normal bilateral distal pulses  Skin:    General: Skin is warm.     Capillary Refill: Capillary refill takes less than 2 seconds.     Comments: Her left stump from surgery appears to be healing well without drainage, erythema, swelling or tenderness  Neurological:     General: No focal deficit present.     Mental Status: She is alert.     Comments: Patient can tell me that she is at [facility] but does not know the day of the week, the month or the president      ED Treatments / Results  Labs (all labs ordered are listed, but only abnormal results are displayed) Labs Reviewed  URINE CULTURE - Abnormal; Notable for the following components:      Result Value   Culture >=100,000 COLONIES/mL ENTEROCOCCUS FAECALIS (*)    All other components within normal limits  CBC WITH  DIFFERENTIAL/PLATELET - Abnormal; Notable for the following components:   WBC 12.6 (*)    Lymphs Abs 5.4 (*)    All other components within normal limits  COMPREHENSIVE METABOLIC PANEL - Abnormal; Notable for the following components:   Glucose, Bld 298 (*)    BUN 28 (*)    Creatinine, Ser 1.40 (*)    GFR calc non Af Amer 41 (*)    GFR calc Af Amer 47 (*)    All other components within normal limits  URINALYSIS, ROUTINE W REFLEX MICROSCOPIC - Abnormal; Notable for the following components:   APPearance CLOUDY (*)    Glucose, UA >=500 (*)    Protein, ur 30 (*)    Leukocytes,Ua LARGE (*)    WBC, UA >50 (*)    Bacteria, UA MANY (*)    Non Squamous Epithelial 0-5 (*)    All other components within normal limits  BASIC METABOLIC PANEL - Abnormal; Notable for the following components:   Glucose, Bld 199 (*)    BUN 23 (*)    Creatinine, Ser 1.04 (*)    GFR calc non Af Amer 58 (*)    All other components within normal limits  CBC - Abnormal; Notable for the following components:   WBC 12.4 (*)    All other components within normal limits  GLUCOSE, CAPILLARY - Abnormal; Notable for the following components:   Glucose-Capillary 181 (*)    All other components within normal limits  GLUCOSE, CAPILLARY - Abnormal; Notable for the following components:   Glucose-Capillary 189 (*)    All other components within normal limits  GLUCOSE, CAPILLARY - Abnormal; Notable for the following components:   Glucose-Capillary 199 (*)    All other components within normal limits  GLUCOSE, CAPILLARY - Abnormal; Notable for the following components:   Glucose-Capillary 231 (*)    All other components within normal limits  POCT I-STAT EG7 - Abnormal; Notable for the following components:   pO2, Ven 46.0 (*)    All other components within normal limits  SARS CORONAVIRUS 2 (HOSPITAL ORDER, [place])  BRAIN NATRIURETIC PEPTIDE  LIPASE, BLOOD  HIV ANTIBODY (ROUTINE TESTING W  REFLEX)  I-STAT VENOUS BLOOD GAS, ED    EKG None  Radiology Dg Wrist Complete Left  Result Date: 05/11/2019 CLINICAL DATA:  Recent falls with left wrist pain, initial  encounter EXAM: LEFT WRIST - COMPLETE 3+ VIEW COMPARISON:  None. FINDINGS: Mild separation of the scapholunate space is noted which may be related to mild ligamentous injury. No acute fracture or dislocation is seen. No gross soft tissue abnormality is noted. No other focal abnormality is seen. IMPRESSION: Mild widening of the scapholunate space which may be related to ligamentous injury. No acute fracture is seen. Electronically Signed   By: Inez Catalina M.D.   On: 05/11/2019 13:37   Ct Head Wo Contrast  Result Date: 05/11/2019 CLINICAL DATA:  Altered mental status. EXAM: CT HEAD WITHOUT CONTRAST TECHNIQUE: Contiguous axial images were obtained from the base of the skull through the vertex without intravenous contrast. COMPARISON:  CT scan of January 06, 2017. FINDINGS: Brain: No evidence of acute infarction, hemorrhage, hydrocephalus, extra-axial collection or mass lesion/mass effect. Vascular: No hyperdense vessel or unexpected calcification. Skull: Normal. Negative for fracture or focal lesion. Sinuses/Orbits: No acute finding. Other: None. IMPRESSION: Normal head CT. Electronically Signed   By: Marijo Conception M.D.   On: 05/11/2019 14:17   US Renal  Result Date: 05/12/2019 CLINICAL DATA:  58 year old female with acute kidney injury. EXAM: RENAL / URINARY TRACT ULTRASOUND COMPLETE COMPARISON:  Lumbar MRI 06/13/2015. FINDINGS: Right Kidney: Renal measurements: 10.7 x 5.8 x 6.8 centimeters = volume: 220 mL. Normal cortical echogenicity. No right hydronephrosis. Simple appearing 2.5 centimeter right renal cyst (image 8). Left Kidney: Renal measurements: 10.9 x 6.0 x 6.4 centimeters = volume: 221 mL. Normal cortical echogenicity. No left hydronephrosis or renal mass. Bladder: Irregular echogenic debris in the urinary bladder (image  13) without apparent vascular elements. This encompasses 6 centimeters. No superimposed bladder wall thickening is evident. IMPRESSION: 1. Irregular echogenic material in the urinary bladder appears to be some sort of debris (query hematuria, blood clot) rather than a solid mass. 2. Negative ultrasound appearance of both kidneys. Electronically Signed   By: Genevie Ann M.D.   On: 05/12/2019 00:01   Dg Chest Port 1 View  Result Date: 05/11/2019 CLINICAL DATA:  Shortness of breath. EXAM: PORTABLE CHEST 1 VIEW COMPARISON:  Radiographs of January 18, 2019. FINDINGS: The heart size and mediastinal contours are within normal limits. Both lungs are clear. No pneumothorax or pleural effusion is noted. The visualized skeletal structures are unremarkable. IMPRESSION: No active disease. Electronically Signed   By: Marijo Conception M.D.   On: 05/11/2019 13:34    Procedures Procedures (including critical care time)  Medications Ordered in ED Medications  atenolol (TENORMIN) tablet 100 mg (100 mg Oral Given 05/12/19 0823)  rosuvastatin (CRESTOR) tablet 20 mg (20 mg Oral Given 05/12/19 0001)  buPROPion (WELLBUTRIN XL) 24 hr tablet 300 mg (300 mg Oral Given 05/12/19 0823)  DULoxetine (CYMBALTA) DR capsule 30 mg (30 mg Oral Given 05/12/19 0823)  pantoprazole (PROTONIX) EC tablet 40 mg (40 mg Oral Given 05/12/19 0824)  fluticasone (FLONASE) 50 MCG/ACT nasal spray 1 spray (1 spray Each Nare Not Given 05/12/19 1547)  ipratropium (ATROVENT) nebulizer solution 0.5 mg (has no administration in time range)  umeclidinium-vilanterol (ANORO ELLIPTA) 62.5-25 MCG/INH 1 puff (1 puff Inhalation Given 05/12/19 1128)  enoxaparin (LOVENOX) injection 40 mg (40 mg Subcutaneous Given 05/12/19 0824)  0.9 %  sodium chloride infusion ( Intravenous Stopped 05/12/19 0700)  acetaminophen (TYLENOL) tablet 650 mg (has no administration in time range)    Or  acetaminophen (TYLENOL) suppository 650 mg (has no administration in time range)  ondansetron  (ZOFRAN) tablet 4 mg (has no administration in  time range)    Or  ondansetron (ZOFRAN) injection 4 mg (has no administration in time range)  insulin aspart (novoLOG) injection 0-15 Units (3 Units Subcutaneous Given 05/12/19 1253)  insulin aspart (novoLOG) injection 0-5 Units (0 Units Subcutaneous Not Given 05/12/19 0057)  albuterol (PROVENTIL) (2.5 MG/3ML) 0.083% nebulizer solution 2.5 mg (has no administration in time range)  amLODipine (NORVASC) tablet 10 mg (10 mg Oral Given 05/12/19 0823)  oxyCODONE-acetaminophen (PERCOCET/ROXICET) 5-325 MG per tablet 1 tablet (1 tablet Oral Given 05/12/19 1533)  sodium chloride 0.9 % bolus 500 mL (0 mLs Intravenous Stopped 05/11/19 1912)  cefTRIAXone (ROCEPHIN) 1 g in sodium chloride 0.9 % 100 mL IVPB (0 g Intravenous Stopped 05/11/19 1912)     Initial Impression / Assessment and Plan / ED Course  I have reviewed the triage vital signs and the nursing notes.  Pertinent labs & imaging results that were available during my care of the patient were reviewed by me and considered in my medical decision making (see chart for details).  Clinical Course as of May 11 1706  Wed May 10, 8850  [id number] 58 year old female with extensive past medical history presenting to the emergency department from home for altered mental status and shortness of breath.  Patient recently discharged from nursing home on Monday.  Attempted to call the patient's son whom she lives with but there was no answer and the voicemail had not been set up.   [KM]  0102 I spoke with Osie Cheeks, son of patient. He states patient was doing well on Monday but became slightly confused yesterday.  I discussed the work-up of the patient.  Patient appears to have mild UTI with bacteria and leukocytes and white blood cells in her urine.  She has a white count of 12.6.  She is afebrile and her blood pressure is normal.  Her creatinine is just slightly over he baseline at 1.4.  Her COVID test was negative and her  chest x-ray and head CT are also normal.  Going to treat her for UTI with IV Rocephin and a small bolus of fluids.  Discussed admission versus home treatment with the patient's son.  Patient son states that he feels safe taking the patient home as he has him and his brother to care for his mother.  I think that this is reasonable and given the coronavirus outbreak the patient will be best served to be treated at home.   [KM]  2118 Patient return to the emergency department after the son and nursing staff were unable to transfer the patient into the sounds vehicle.  At this time I am going to consult the hospitalist for admission.  Patient cannot return home because she is unable to get up and is not ambulatory.  She may need social work consult.   [KM]  2146 I spoke with resident of the internal medicine teaching service and patient will be admitted by them.   [KM]    Clinical Course User Index [KM] Alveria Apley, PA-C       Based on review of vitals, medical screening exam, lab work and/or imaging, there does not appear to be an acute, emergent etiology for the patient's symptoms. Counseled pt on good return precautions and encouraged both PCP and ED follow-up as needed.  Prior to discharge, I also discussed incidental imaging findings with patient in detail and advised appropriate, recommended follow-up in detail.  Clinical Impression: 1. Urinary tract infection with hematuria, site unspecified   2. AKI (  acute kidney injury) ([place])     Disposition: Discharge  Prior to providing a prescription for a controlled substance, I independently reviewed the patient's recent prescription history on the [place]. The patient had no recent or regular prescriptions and was deemed appropriate for a brief, less than 3 day prescription of narcotic for acute analgesia.  This note was prepared with assistance of Systems analyst. Occasional  wrong-word or sound-a-like substitutions may have occurred due to the inherent limitations of voice recognition software.   Final Clinical Impressions(s) / ED Diagnoses   Final diagnoses:  Urinary tract infection with hematuria, site unspecified  AKI (acute kidney injury) [facility])    ED Discharge Orders         Ordered    cephALEXin (KEFLEX) 500 MG capsule  4 times daily     05/11/19 1838           Kristine Royal 05/11/19 1839    Veryl Speak, MD 05/11/19 1913    Alveria Apley, PA-C 05/12/19 1707    Veryl Speak, MD 05/16/19 [phone]

## 2019-05-11 NOTE — Progress Notes (Signed)
CSW contacted regarding patient's needs for additional home supports. CSW notes per RN report patient recently discharged from a SNF and RN's had significant difficulty transitioning patient into a wheelchair. CSW informed patient will be admitted. CSW will continue to follow for appropriate discharge and social supports as patient's medical work-up progresses.  Lamonte Richer, LCSW, [place] Worker II [phone]

## 2019-05-11 NOTE — ED Triage Notes (Signed)
Pt bib ems from home where she lives with her son. Family called out due to AMS since coming home from rehab facility on Monday. 436 cbg. Fa,ily reports slow to respond and confusion. Oxygen saturations decreased en route to the 80's, placed on 2L [place] with improvement to 100%. Pt + for cough. 115/82, HR 82 RR 22, 99.818LAC. Pt states she does not feel well.

## 2019-05-11 NOTE — ED Notes (Signed)
Son notified again that mother is ready to be picked up. States he is waiting on his cousin and they will be here shortly.

## 2019-05-11 NOTE — ED Notes (Signed)
Pt son notified pt up for discharge. Discharge instructions discussed with pt son.

## 2019-05-11 NOTE — ED Notes (Addendum)
Attempted to discharge pt. Pt must be lifted with sheet from bed to wheelchair x3 people. Pt assisted by x2 EMTs and family to get in [place] from wheelchair. Pt appears disoriented, lethargic, and cannot help with ambulating. Pt cannot be assisted into car. Charge RN [place] notified. Discussed admitting pt with family. Brought back to room 45 in wheelchair. Assisted to bed x5 EMTs via sheet lift. Secured via bed rails x2.

## 2019-05-11 NOTE — ED Notes (Signed)
Patient transported to Ultrasound 

## 2019-05-11 NOTE — H&P (Signed)
Date: 05/11/2019               Patient Name:  Jennifer [name] MRN: [id number]  DOB: 02-Oct-1961 Age / Sex: 58 y.o., female   PCP: Bartholomew Crews, MD         Medical Service: Internal Medicine Teaching Service         Attending Physician: Dr. Evette Doffing, Mallie Mussel, *    First Contact: Dr. Myrtie Hawk Pager: [phone]  Second Contact: Dr. Shan Levans Pager: [phone]       After Hours (After 5p/  First Contact Pager: [phone]  weekends / holidays): Second Contact Pager: [phone]   Chief Complaint: Altered mental status  History of Present Illness:   Jennifer [name] is a 58 year old female with J0DT complicated by diabetic retinopathy and s/p left BKA, HTN, COPD, OSA, MDD, and morbid obesity who presents with altered mental status.  History is limited by Jennifer [name] altered mental status.  She does appear very tired but for the most part is able to answer all of my questions adequately.  I attempted to contact her son [name] who she lives with but there was no answer.  Jennifer [name] was recently living in a SNF after a revision of her left below the knee amputation.  She was discharged home on Monday (2 days prior to presentation) and was noted by her son to be somewhat confused. Jennifer [name] has also been falling more recently because she has not received her prosthetic leg.  Pleasantly she had fallen over the last several days and hurt her left wrist. Jennifer [name] denies falling and having pain currently.  She also states that she does not remember why she is in the hospital but does acknowledge that she is currently in [facility].  She reports shortness of breath with associated cough.  She has also had nausea, vomiting, and diarrhea.  She denies dysuria, hematuria, or increased urinary frequency.  She denies fevers, chest pain, palpitations.  In the ED she was found to be hypertensive with highest BP being 160/106.  Otherwise vital signs were unremarkable.  UA showed >500 glucose, protein, large  leukocytes, >50 WBC, many bacteria, and 9 squamous epithelial.  VBG was unremarkable.  CBC showed leukocytosis of 12.6 (WBC has been between 12-14 since she had a wound dehiscence of her left BKA surgical site).  She also had an elevated creatinine of 1.4 (baseline between 1.19-1.27).  Liver function test normal.  It was initially thought that she had a UTI and the plan was to give her Rocephin in the ED and discharge her with oral antibiotics.  Unfortunately when her son attempted to move her to the car he did that she was very confused and not ready to come home.  Meds: She is on multiple medications on the beers criteria list including Baclofen, Oxycodone, Trazodone, Hydroxyzine, Mirabegron, Fesoterodine, and Lyrica.  No current facility-administered medications on file prior to encounter.    Current Outpatient Medications on File Prior to Encounter  Medication Sig  . acetaminophen (TYLENOL) 500 MG tablet Take 1 tablet (500 mg total) by mouth every 6 (six) hours as needed for mild pain.  Marland Kitchen albuterol (PROVENTIL HFA;VENTOLIN HFA) 108 (90 Base) MCG/ACT inhaler Inhale 1-2 puffs into the lungs every 4 (four) hours as needed for wheezing or shortness of breath.  Marland Kitchen albuterol (PROVENTIL) (2.5 MG/3ML) 0.083% nebulizer solution Take 3 mLs (2.5 mg total) by nebulization every 6 (six) hours as needed for wheezing.  Marland Kitchen  amLODipine-benazepril (LOTREL) 10-40 MG capsule TAKE 1 CAPSULE BY MOUTH EVERY DAY  . atenolol (TENORMIN) 100 MG tablet TAKE 1 TABLET BY MOUTH EVERY DAY  . baclofen (LIORESAL) 10 MG tablet TAKE 1 TABLET BY MOUTH THREE TIMES A DAY AS NEEDED FOR MUSCLE SPASMS (Patient taking differently: Take 10 mg by mouth 3 (three) times daily as needed for muscle spasms. )  . betamethasone dipropionate (DIPROLENE) 0.05 % cream Apply 1 application topically 2 (two) times daily as needed (irritation).  Marland Kitchen buPROPion (WELLBUTRIN XL) 300 MG 24 hr tablet TAKE 1 TABLET BY MOUTH DAILY (Patient taking differently: Take 300  mg by mouth daily. )  . cholecalciferol (VITAMIN D) 1000 units tablet Take 1,000 Units by mouth daily.  . DULoxetine (CYMBALTA) 30 MG capsule TAKE 1 CAPSULE BY MOUTH EVERY DAY (Patient taking differently: Take 30 mg by mouth daily. )  . EASY TOUCH PEN NEEDLES 31G X 8 MM MISC USE TO INJECT INSULIN TWICE DAILY  . fesoterodine (TOVIAZ) 8 MG TB24 tablet Take 8 mg by mouth daily.   . fluticasone (FLONASE) 50 MCG/ACT nasal spray INSTILL 1 SPRAY IN EACH NOSTRIL DAILY  . furosemide (LASIX) 40 MG tablet TAKE ONE TABLET BY MOUTH DAILY AS NEEDED (Patient taking differently: Take 40 mg by mouth daily as needed for fluid. )  . hydrochlorothiazide (HYDRODIURIL) 25 MG tablet TAKE 1 TABLET BY MOUTH ONCE DAILY (Patient taking differently: Take 25 mg by mouth daily. )  . hydrOXYzine (ATARAX/VISTARIL) 25 MG tablet TAKE 1 TABLET (25 MG TOTAL) BY MOUTH 3 (THREE) TIMES DAILY AS NEEDED FOR ITCHING.  . Insulin Degludec (TRESIBA FLEXTOUCH) 200 UNIT/ML SOPN Inject 66 Units into the skin daily. (Patient taking differently: Inject 70 Units into the skin daily. )  . ipratropium (ATROVENT) 0.02 % nebulizer solution USE 1 VIAL VIA NEBULIZER EVERY 6 HOURS AS NEEDED FOR WHEEZING (Patient taking differently: Take 0.5 mg by nebulization every 6 (six) hours as needed for wheezing. )  . Lancets (ONETOUCH DELICA PLUS [id number]) MISC USE TO CHECK BLOOD SUGAR FOUR TIMES A DAY  . metFORMIN (GLUCOPHAGE-XR) 500 MG 24 hr tablet TAKE 1 TABLET BY MOUTH EVERY DAY WITH BREAKFAST  . mirabegron ER (MYRBETRIQ) 50 MG TB24 tablet Take 1 tablet (50 mg total) by mouth daily.  . Multiple Vitamins-Minerals (MULTIVITAMIN WITH MINERALS) tablet Take 1 tablet by mouth daily.  . NYSTATIN powder APPLY TO AFFECTED AREA THREE TIMES A DAY  . omeprazole (PRILOSEC) 20 MG capsule TAKE 1 CAPSULE (20 MG TOTAL) BY MOUTH 2 (TWO) TIMES DAILY BEFORE A MEAL.  Marland Kitchen ONETOUCH VERIO test strip USE 3-4 TIMES DAILY TO CHECK BLOOD SUGAR  . oxyCODONE-acetaminophen (PERCOCET/ROXICET)  5-325 MG tablet Take 1 tablet by mouth every 8 (eight) hours as needed for up to 30 days. May take a 4th pill if severe pain (up to 10 days monthly) #100 equals 30 day supply  . pregabalin (LYRICA) 200 MG capsule TAKE 1 CAPSULE BY MOUTH THREE TIMES A DAY  . rosuvastatin (CRESTOR) 20 MG tablet TAKE 1 TABLET BY MOUTH AT BEDTIME  . traZODone (DESYREL) 100 MG tablet TAKE ONE TABLET BY MOUTH DAILY AT BEDTIME AS NEEDED FOR SLEEP  . umeclidinium-vilanterol (ANORO ELLIPTA) 62.5-25 MCG/INH AEPB Inhale 1 puff into the lungs daily.  . vitamin B-12 (CYANOCOBALAMIN) 100 MCG tablet Take 100 mcg by mouth daily.  . vitamin C (ASCORBIC ACID) 500 MG tablet Take 500 mg by mouth daily.  . [DISCONTINUED] Potassium Chloride ER 20 MEQ TBCR Take 40 mEq by mouth  daily.    Allergies: Allergies as of 05/11/2019 - Review Complete 05/11/2019  Allergen Reaction Noted  . Iohexol Other (See Comments) 08/17/2007  . Ivp dye [iodinated diagnostic agents] Other (See Comments) 05/10/2013  . Abilify [aripiprazole] Other (See Comments) 11/09/2015  . Morphine sulfate Itching and Rash    Past Medical History:  Diagnosis Date  . Anginal pain [facility])    '3' of 10 ischemia ruled out 9/9   . Arthritis of lumbar spine   . Asthma   . Cataract   . CHF (congestive heart failure) ([place])   . 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 Chronic bronchitis ([place])    "I get it alot" (09/28/2013)  . Chronic diastolic heart failure (HCC)    grade 2 per 2D echocardiogram (01/2013)  . Chronic lower back pain   . Chronic osteomyelitis of foot (HCC)    chronic, right secondary to diabetic foot ulcers  . Chronic pain syndrome 12/03/2011   Likely secondary to depression, "fibromyalgia", neuropathy, and obesity. Lumbar MRI 2014 no sig change from prior (2008) : Stable hypertrophic facet disease most notable at L4-5. Stable shallow left foraminal/extraforaminal disc protrusion at L4-5. No direct neural compression.      Marland Kitchen COPD 01/08/2007    PFT's 05/2007 : FEV1/FVC 82, FEV1 64% pred, FEF 25-75% 40% predicted, 16% improvement in FEV1 with bronchodilators.     . Depression   . Diabetes mellitus without complication ([place])    Type II  . Diabetic peripheral neuropathy ([place])   . DVT of upper extremity (deep vein thrombosis) ([place]) 03/11/2013   Secondary to PICC line. Right brachial vein, diagnosed on 03/10/2013 Coumadin for 3 months. End date 06/10/2013   . Dyspnea    "smoker"  . Environmental allergies    Hx: of  . Exertional shortness of breath   . Fatty liver 2003   observed on ultrasound abdomen  . Fibromyalgia   . GERD (gastroesophageal reflux disease)   . Glaucoma   . History of use of hearing aid   . Hyperlipidemia   . Hyperplastic colon polyp 12/2010   Per colonoscopy (12/2010) - Dr. Deatra Ina  . Hypertension   . 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 Blood cultures 01/26/13 S. Aureus and GBS. Blood cultures Feb 6th, 8th, and 9th and March were negative.Repeat TEE 3/20 negative for vegitations  . 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 2/2 tobacco use, DM, and cont weight bearing on surgical wound and developed gangrene   . Pneumonia   . Polymicrobial bacterial infection 01/2013   GBS and S. aureus bacteremia // Source likely infected diabetic foot ulcer  . PVD (peripheral vascular disease) with claudication ([place])    Stents to bilateral common iliac arteries (left 2005, right 2008), on chronic plavix  . Rheumatoid arthritis ([place])   . Tobacco abuse   . Type II diabetes mellitus with peripheral circulatory disorders, uncontrolled DX: 1993   Insulin dep. Poor control. Complicated by diabetic foot ulcer and diabetic eye disease.    Marland Kitchen Ulcer of foot, chronic (HCC)    Left. No OM per MRI (01/2013)    Family History:  Family History  Problem Relation Age of Onset  . Diverticulosis Mother   .  Diabetes Mother   . Hypertension Mother   . Congestive Heart Failure Mother   . Asthma Father   . CAD  Sister 62       MI at age 67 per patient.  However, she has not had a stent or CABG.   . Heart disease Sister        before age 24  . Breast cancer Neg Hx     Social History: She lives in [place] with her son.  He previously worked in Teacher, adult education, psychology.  Her chart review she is a current tobacco smoker.  She does not use alcohol or illicit substances.  Review of Systems: A complete ROS was negative except as per HPI.   Physical Exam: Blood pressure (!) 138/48, pulse 74, temperature 99 F (37.2 C), temperature source Oral, resp. rate 15, SpO2 99 %. General: Lethargic appearing female lying in bed in no acute distress Neuro: She does appear to be very sleepy throughout her exam but answer questions appropriately.  She follows commands including deep breathing squeezing my fingers.  No focal neurologic deficits. HEENT: Normocephalic and atraumatic, PERRL Cardiovascular: Normal rate, regular rhythm Respiratory: Auscultated lungs anteriorly, clear to auscultation bilaterally.  Normal work of breathing. Abdominal: She winces in pain when palpating all 4 quadrants of the abdomen (worse lower quadrant).  Soft. MSK: Left BKA with clean and intact incision.  There does appear to be several small circular erosions which do appear to be where sutures were removed.  No tenderness, erythema, or warmth to the area.  Right lower extremity without edema, erythema, or warmth. Skin: Upper extremities are cool to the touch.  Lower extremities are warm.  CXR: personally reviewed my interpretation is normal heart size, clear lungs, no bony abnormalities.  Head CT: IMPRESSION: Normal head CT.  Left wrist x-ray: IMPRESSION: Mild widening of the scapholunate space which may be related to ligamentous injury. No acute fracture is seen.  Assessment & Plan by Problem: Active Problems:    Encephalopathy, toxic  Jennifer [name] is a 58 year old female with K5GY complicated by diabetic retinopathy and left BKA, HTN, COPD, OSA, MDD, and morbid obesity who presents with altered mental status.  Her acute encephalopathy is likely multifactorial in nature secondary to a viral gastroenteritis with nausea/vomiting/diarrha while being on multiple centrally acting medications.  She was found to have an abnormal urinalysis but is currently without symptoms suggesting asymptomatic bacteriuria over a UTI.   Acute encephalopathy: - We will hold all of her centrally acting medications including Baclofen, Oxycodone, Trazodone, Hydroxyzine, Mirabegron, Fesoterodine, and Lyric. - S/p 500 mils IV fluid bolus, will start maintenance IV fluids at 100 mils per hour - She was given 1 dose of ceftriaxone in the ED.  Will hold off on adding any additional doses at this time. -Follow-up urine culture  Acute kidney injury: Over the past month Creatinine has increased from a baseline of ~1.0 to 1.4. Will plan on getting a renal ultrasound to rule out obstruction given multiple medications that can cause hypoactive bladder.  - Follow-up renal ultrasound - Hold home benazepril (ACEI) and HCTZ  Hypertension: Blood pressure medications include amlodipine-benazepril 10-40 mg daily, hydrochlorothiazide 25 mg daily, and atenolol milligrams daily.  Her blood pressure today is elevated to SBP of 160s. - Will continue amlodipine at 10 mg daily and atenolol 100 mg daily - Hold home benazepril and hydrochlorothiazide in the setting of AKI.  Type 2 diabetes: Home meds include metformin XR 500 mg daily, Tresiba 66 units daily. -Hold home medications -Start SSI 3 times daily with meals and at bedtime  COPD: Respiratory status stable. Jearl Klinefelter  Ellipta daily - Albuterol every 4 hours PRN wheezing - Atrovent every 6 hours PRN wheezing  MDD: -Continue bupropion 300 mg and duloxetine 30 mg daily  FEN/GI: Carb modified DVT  prophylaxis: Lovenox CODE STATUS: Full  Dispo: Admit patient to Inpatient with expected length of stay greater than 2 midnights.  Signed: Carroll Sage, MD 05/11/2019, 9:43 PM  Pager: [phone]

## 2019-05-11 NOTE — Progress Notes (Signed)
Orthopedic Tech Progress Note Patient Details:  Jennifer [name] 1961-12-01 [id number]  Ortho Devices Type of Ortho Device: Velcro wrist forearm splint Ortho Device/Splint Location: ULE Ortho Device/Splint Interventions: Adjustment, Application, Ordered   Post Interventions Patient Tolerated: Well Instructions Provided: Care of device, Adjustment of device   Janit Pagan 05/11/2019, 2:30 PM

## 2019-05-11 NOTE — ED Notes (Signed)
Pt dislodged her iv. Attempted for another IV without success. Will start IV abx after another IV is placed,

## 2019-05-12 ENCOUNTER — Other Ambulatory Visit: Payer: Self-pay

## 2019-05-12 DIAGNOSIS — J449 Chronic obstructive pulmonary disease, unspecified: Secondary | ICD-10-CM

## 2019-05-12 DIAGNOSIS — G92 Toxic encephalopathy: Principal | ICD-10-CM

## 2019-05-12 DIAGNOSIS — Z91041 Radiographic dye allergy status: Secondary | ICD-10-CM

## 2019-05-12 DIAGNOSIS — Z888 Allergy status to other drugs, medicaments and biological substances status: Secondary | ICD-10-CM

## 2019-05-12 DIAGNOSIS — E1151 Type 2 diabetes mellitus with diabetic peripheral angiopathy without gangrene: Secondary | ICD-10-CM

## 2019-05-12 DIAGNOSIS — Z79891 Long term (current) use of opiate analgesic: Secondary | ICD-10-CM

## 2019-05-12 DIAGNOSIS — Z720 Tobacco use: Secondary | ICD-10-CM

## 2019-05-12 DIAGNOSIS — Z794 Long term (current) use of insulin: Secondary | ICD-10-CM

## 2019-05-12 DIAGNOSIS — F332 Major depressive disorder, recurrent severe without psychotic features: Secondary | ICD-10-CM

## 2019-05-12 DIAGNOSIS — I10 Essential (primary) hypertension: Secondary | ICD-10-CM

## 2019-05-12 DIAGNOSIS — N179 Acute kidney failure, unspecified: Secondary | ICD-10-CM

## 2019-05-12 DIAGNOSIS — Z7951 Long term (current) use of inhaled steroids: Secondary | ICD-10-CM

## 2019-05-12 DIAGNOSIS — Z89512 Acquired absence of left leg below knee: Secondary | ICD-10-CM

## 2019-05-12 DIAGNOSIS — Z79899 Other long term (current) drug therapy: Secondary | ICD-10-CM

## 2019-05-12 DIAGNOSIS — G4733 Obstructive sleep apnea (adult) (pediatric): Secondary | ICD-10-CM

## 2019-05-12 DIAGNOSIS — Z885 Allergy status to narcotic agent status: Secondary | ICD-10-CM

## 2019-05-12 DIAGNOSIS — E11319 Type 2 diabetes mellitus with unspecified diabetic retinopathy without macular edema: Secondary | ICD-10-CM

## 2019-05-12 LAB — BASIC METABOLIC PANEL
Anion gap: 12 (ref 5–15)
BUN: 23 mg/dL — ABNORMAL HIGH (ref 6–20)
CO2: 22 mmol/L (ref 22–32)
Calcium: 9.3 mg/dL (ref 8.9–10.3)
Chloride: 110 mmol/L (ref 98–111)
Creatinine, Ser: 1.04 mg/dL — ABNORMAL HIGH (ref 0.44–1.00)
GFR calc Af Amer: >60 mL/min (ref >60)
GFR calc non Af Amer: 58 mL/min — ABNORMAL LOW (ref >60)
Glucose, Bld: 199 mg/dL — ABNORMAL HIGH (ref 70–99)
Potassium: 3.6 mmol/L (ref 3.5–5.1)
Sodium: 144 mmol/L (ref 135–145)

## 2019-05-12 LAB — GLUCOSE, CAPILLARY
Glucose-Capillary: 112 mg/dL — ABNORMAL HIGH (ref 70–99)
Glucose-Capillary: 181 mg/dL — ABNORMAL HIGH (ref 70–99)
Glucose-Capillary: 189 mg/dL — ABNORMAL HIGH (ref 70–99)
Glucose-Capillary: 199 mg/dL — ABNORMAL HIGH (ref 70–99)
Glucose-Capillary: 231 mg/dL — ABNORMAL HIGH (ref 70–99)

## 2019-05-12 LAB — CBC
HCT: 37.9 % (ref 36.0–46.0)
Hemoglobin: 12.3 g/dL (ref 12.0–15.0)
MCH: 31.2 pg (ref 26.0–34.0)
MCHC: 32.5 g/dL (ref 30.0–36.0)
MCV: 96.2 fL (ref 80.0–100.0)
Platelets: 154 10*3/uL (ref 150–400)
RBC: 3.94 MIL/uL (ref 3.87–5.11)
RDW: 13.3 % (ref 11.5–15.5)
WBC: 12.4 10*3/uL — ABNORMAL HIGH (ref 4.0–10.5)
nRBC: 0 % (ref 0.0–0.2)

## 2019-05-12 LAB — HIV ANTIBODY (ROUTINE TESTING W REFLEX): HIV Screen 4th Generation wRfx: NONREACTIVE

## 2019-05-12 MED ORDER — OXYCODONE-ACETAMINOPHEN 5-325 MG PO TABS
1.0000 | ORAL_TABLET | Freq: Three times a day (TID) | ORAL | Status: DC | PRN
  Administered 2019-05-12 – 2019-05-13 (×4): 1 via ORAL
  Filled 2019-05-12 (×4): qty 1

## 2019-05-12 MED ORDER — SULFAMETHOXAZOLE-TRIMETHOPRIM 400-80 MG/5ML IV SOLN: 160.0000 mg | Freq: Two times a day (BID) | INTRAVENOUS | Status: DC

## 2019-05-12 NOTE — Progress Notes (Addendum)
Ms. Hewlett was seen and evaluated at bed side. She is alert but sitting in bed confused and tearful. Although she is slightly more responsive now, still minimaly verbaliz. She endorses that she is upset. When asking her why is she crying or what is bothering her, she says "I don't know". Unclear if she has pain but appears uncomfortable. RN has contacted her son to talk to Ms. Nishi but patient did not do so.  Her vitals including O2 sat has been stable.   She is depressed and tearfull, uncomfortable may be due to pain and in setting of acute encephalopathy likely 2/2 polypharmacy. Per floor staff who knows her from previous admission, this is different than her baseline that time. AKI slightly improved today, has asymptomatic bacteriuria but holding off on Ab as no evidence of infection, no leukocytosis, no fever.  Also no other reversible abnormality. Will avoid centraly acting medicine how ever she has chronicly been on Percocet and give her low dose with precautions to help for pain and avoid withdrawal. Will monitor and reevaluate tomorrow. Will continue Cymbalta and Wellbuterin for her depressed mood. Fall precautions.   Elham Liat Mayol IM PGY-1

## 2019-05-12 NOTE — Evaluation (Signed)
Physical Therapy Evaluation Patient Details Name: Jennifer [name] MRN: [id number] DOB: 1961-07-05 Today's Date: 05/12/2019   History of Present Illness  Pt is a 58 y/o female admitted secondary to AMS. 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    Clinical Impression  Pt presented supine in bed with HOB elevated, awake and willing to participate in therapy session. Pt very confused and restless throughout, groaning/moaning in pain, unable to provide any reliable information re: PLOF or home environment. At the time of evaluation, pt required min-mod A for bed mobility and total A for pericare. Pt very limited secondary to fatigue, weakness and confusion. Pt would continue to benefit from skilled physical therapy services at this time while admitted and after d/c to address the below listed limitations in order to improve overall safety and independence with functional mobility.     Follow Up Recommendations SNF    Equipment Recommendations  None recommended by PT    Recommendations for Other Services       Precautions / Restrictions Precautions Precautions: Fall Precaution Comments: prior L transtibial amputation Restrictions Weight Bearing Restrictions: No      Mobility  Bed Mobility Overal bed mobility: Needs Assistance Bed Mobility: Rolling;Supine to Sit;Sit to Supine Rolling: Min assist;Mod assist   Supine to sit: Min assist Sit to supine: Min assist   General bed mobility comments: pt rolling bilaterally in bed for pericare and to change out linens as pt and bed were completely soaked upon arrival  Transfers                    Ambulation/Gait                Stairs            Wheelchair Mobility    Modified Rankin (Stroke Patients Only)       Balance Overall balance assessment: Needs assistance   Sitting balance-Leahy Scale: Poor                                        Pertinent Vitals/Pain Pain Assessment: Faces Faces Pain Scale: Hurts whole lot Pain Location: generalized (unable to state) Pain Descriptors / Indicators: Grimacing;Guarding;Moaning Pain Intervention(s): Monitored during session;Repositioned    Home Living Family/patient expects to be discharged to:: Private residence Living Arrangements: Children Available Help at Discharge: Family;Available PRN/intermittently Type of Home: House Home Access: Stairs to enter   [employer] of Steps: 5 Home Layout: One level Home Equipment: Wheelchair - manual Additional Comments: information gathered from chart review from previous admission as pt confused throughout    Prior Function Level of Independence: Needs assistance         Comments: unsure - pt groaning in pain throughout, very confused and unable to provide any reliable information     Hand Dominance        Extremity/Trunk Assessment   Upper Extremity Assessment Upper Extremity Assessment: Defer to OT evaluation;Difficult to assess due to impaired cognition    Lower Extremity Assessment Lower Extremity Assessment: LLE deficits/detail;Difficult to assess due to impaired cognition LLE Deficits / Details: pt with prior transtibial amputation; open sores/wounds at distal aspect       Communication   Communication: No difficulties  Cognition Arousal/Alertness: Awake/alert Behavior During Therapy: Restless;Anxious Overall Cognitive Status: Difficult to assess  General Comments: pt able to follow simple one-step commands with multimodal cueing      General Comments      Exercises     Assessment/Plan    PT Assessment Patient needs continued PT services  PT Problem List Decreased strength;Decreased activity tolerance;Decreased balance;Decreased mobility;Decreased coordination;Decreased cognition;Decreased safety awareness;Decreased knowledge of use of  DME;Decreased knowledge of precautions;Pain       PT Treatment Interventions DME instruction;Gait training;Stair training;Therapeutic activities;Therapeutic exercise;Functional mobility training;Balance training;Neuromuscular re-education;Cognitive remediation;Patient/family education    PT Goals (Current goals can be found in the Care Plan section)  Acute Rehab PT Goals Patient Stated Goal: unable to state PT Goal Formulation: Patient unable to participate in goal setting Time For Goal Achievement: 05/26/19 Potential to Achieve Goals: Fair    Frequency Min 2X/week   Barriers to discharge        Co-evaluation               AM-PAC PT "6 Clicks" Mobility  Outcome Measure Help needed turning from your back to your side while in a flat bed without using bedrails?: A Lot Help needed moving from lying on your back to sitting on the side of a flat bed without using bedrails?: A Lot Help needed moving to and from a bed to a chair (including a wheelchair)?: Total Help needed standing up from a chair using your arms (e.g., wheelchair or bedside chair)?: Total Help needed to walk in hospital room?: Total Help needed climbing 3-5 steps with a railing? : Total 6 Click Score: 8    End of Session   Activity Tolerance: Patient limited by pain Patient left: in bed;with call bell/phone within reach;with bed alarm set Nurse Communication: Mobility status PT Visit Diagnosis: Other abnormalities of gait and mobility (R26.89)    Time: 1435-1459 PT Time Calculation (min) (ACUTE ONLY): 24 min   Charges:   PT Evaluation $PT Eval Moderate Complexity: 1 Mod PT Treatments $Therapeutic Activity: 8-22 mins        Sherie Don, PT, DPT  Acute Rehabilitation Services Pager [phone] Office [place] 05/12/2019, 3:41 PM

## 2019-05-12 NOTE — Social Work (Addendum)
1:45pm- Informed by Wandra Feinstein that pt may have Medicaid. Pt number is [phone] L. Will confirm with Financial Counseling.   8:21am- CSW acknowledging consult for SNF placement. Aware that pt has recently been to [place]- spoke with [place] at [facility] and pt had been admitted on 4/22 and issued a notice on 5/15 that [facility] Medicare would no longer cover SNF stay. Pt discharged from SNF on 5/18.  Pt has no additional insurance coverage per chart. If pt cannot afford copays then unable to place pt, will need to discuss [place].    Westley Hummer, MSW, [place] Work [phone]

## 2019-05-12 NOTE — Progress Notes (Signed)
Occupational Therapy Evaluation Patient Details Name: Jennifer [name] MRN: [id number] DOB: 1961-12-05 Today's Date: 05/12/2019    History of Present Illness Pt is a 58 y/o female admitted secondary to AMS. 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   Clinical Impression   Pt very limited today during evaluation due to pain. Pt groaning and responding to therapist questions. PLOF and home living information gathered from previous admission. Pt requiring min assist for bed mobility, tolerating sitting EOB ~1 minute. Mod-max assist for UB/LB ADLs. Recommending SNF for discharge planning. Will continue to follow acutely.    Follow Up Recommendations  SNF;Supervision/Assistance - 24 hour    Equipment Recommendations  (defer to next venue)    Recommendations for Other Services       Precautions / Restrictions Precautions Precautions: Fall Precaution Comments: prior L transtibial amputation Restrictions Weight Bearing Restrictions: No      Mobility Bed Mobility Overal bed mobility: Needs Assistance Bed Mobility: Rolling;Supine to Sit;Sit to Supine Rolling: Min assist;Mod assist   Supine to sit: Min assist Sit to supine: Min assist   General bed mobility comments: Pt sat EOB ~1 minute but then requesting to lay back down due to pain.  Transfers                      Balance Overall balance assessment: Needs assistance Sitting-balance support: Bilateral upper extremity supported Sitting balance-Leahy Scale: Poor                                     ADL either performed or assessed with clinical judgement   ADL Overall ADL's : Needs assistance/impaired     Grooming: Wash/dry hands;Wash/dry face;Minimal assistance;Bed level   Upper Body Bathing: Moderate assistance;Bed level   Lower Body Bathing: Maximal assistance;Bed level   Upper Body Dressing : Moderate assistance;Bed level    Lower Body Dressing: Maximal assistance;Bed level                 General ADL Comments: limited by pain     Vision         Perception     Praxis      Pertinent Vitals/Pain Pain Assessment: Faces Faces Pain Scale: Hurts whole lot Pain Location: generalized (unable to state) Pain Descriptors / Indicators: Grimacing;Guarding;Moaning Pain Intervention(s): Limited activity within patient's tolerance     Hand Dominance     Extremity/Trunk Assessment Upper Extremity Assessment Upper Extremity Assessment: Generalized weakness   Lower Extremity Assessment Lower Extremity Assessment: Defer to PT evaluation LLE Deficits / Details: pt with prior transtibial amputation; open sores/wounds at distal aspect       Communication Communication Communication: No difficulties   Cognition Arousal/Alertness: Awake/alert Behavior During Therapy: Restless;Anxious Overall Cognitive Status: Difficult to assess                                 General Comments: pt able to follow simple one-step commands with multimodal cueing   General Comments       Exercises     Shoulder Instructions      Home Living Family/patient expects to be discharged to:: Private residence Living Arrangements: Children Available Help at Discharge: Family;Available PRN/intermittently Type of Home: House Home Access: Stairs to enter Entrance Stairs-Number of Steps: 5   Home  Layout: One level     Bathroom Shower/Tub: (does a "wash up")   Bathroom Toilet: Standard     Home Equipment: Wheelchair - manual   Additional Comments: information gathered from chart review from previous admission as pt confused throughout      Prior Functioning/Environment Level of Independence: Needs assistance        Comments: unsure - pt groaning in pain throughout, very confused and unable to provide any reliable information        OT Problem List: Decreased strength;Decreased activity  tolerance;Impaired balance (sitting and/or standing);Decreased knowledge of use of DME or AE;Pain      OT Treatment/Interventions: Therapeutic exercise;Self-care/ADL training;DME and/or AE instruction;Therapeutic activities;Patient/family education    OT Goals(Current goals can be found in the care plan section) Acute Rehab OT Goals Patient Stated Goal: unable to state OT Goal Formulation: Patient unable to participate in goal setting Time For Goal Achievement: 05/26/19 Potential to Achieve Goals: Good  OT Frequency: Min 2X/week   Barriers to D/C:            Co-evaluation              AM-PAC OT "6 Clicks" Daily Activity     Outcome Measure Help from another person eating meals?: A Little Help from another person taking care of personal grooming?: A Little Help from another person toileting, which includes using toliet, bedpan, or urinal?: A Lot Help from another person bathing (including washing, rinsing, drying)?: A Lot Help from another person to put on and taking off regular upper body clothing?: A Lot Help from another person to put on and taking off regular lower body clothing?: A Lot 6 Click Score: 14   End of Session Nurse Communication: Mobility status  Activity Tolerance: Patient limited by pain Patient left: in bed;with call bell/phone within reach  OT Visit Diagnosis: Unsteadiness on feet (R26.81);Other abnormalities of gait and mobility (R26.89);Pain                Time: 3202-3343 OT Time Calculation (min): 12 min Charges:  OT General Charges $OT Visit: 1 Visit OT Evaluation $OT Eval Moderate Complexity: 1 Mod    Darrol Jump OTR/L Acute Rehab Services [phone] 05/12/2019, 5:21 PM

## 2019-05-12 NOTE — Plan of Care (Signed)

## 2019-05-12 NOTE — Progress Notes (Addendum)
   Subjective:  Jennifer [name] was seen and evaluated this morning. Unfortunately she appears uncomfortable and gets tearful, she is not able to tell us what is bothering her. She repeats "no" to most of the questions and does not express any other word.  Objective:  Vital signs in last 24 hours: Vitals:   05/11/19 1630 05/11/19 2357 05/12/19 0413 05/12/19 1129  BP:  (!) 156/71 (!) 158/75   Pulse: 74 62 62   Resp: 15 18 11    Temp:  97.8 F (36.6 C) (!) 97.5 F (36.4 C)   TempSrc:  Oral Oral   SpO2: 99% 99% 97% 97%  Weight:  109.5 kg    Height:  5\' 7"  (1.702 m)     Physical exam: General: Jennifer [name] sitting on the bed, appears uncomfortable and is tearful Lung exam: CTA bilateraly Abodmen: Soft Extremities: Left BKA, no swelling or tenderness on rt leg Neurology exam: Alert but not oriented  Assessment/Plan:  Active Problems:   Encephalopathy, toxic  Jennifer [name] is a 58 year old female with X4GY complicated by diabetic retinopathy and left BKA, HTN, COPD, OSA, MDD, and morbid obesity who presents with toxic encephalopathy   Acute toxic encephalopathy:  Likely due to poly pharmacy   -Holding Baclofen, Trazodone, Hydroxyzine, lyric -f/u urine culture result  Acute kidney injury: Over the past month Creatinine has increased from a baseline of ~1.0 to 1.4. Renal ultrasound with bladder debries -Hold home benazepril (ACEI) and HCTZ -BMP daily  DM II and left BKA:  She is on metformin XR 500 mg daily, Tresiba 66 units daily. -SSI -CBG monitoring -HbA1c  Dispo: Anticipated discharge in 2-3 days  Dewayne Hatch, MD 05/12/2019, 12:58 PM  Pager: [phone]

## 2019-05-13 ENCOUNTER — Other Ambulatory Visit: Payer: Self-pay | Admitting: Internal Medicine

## 2019-05-13 DIAGNOSIS — J42 Unspecified chronic bronchitis: Secondary | ICD-10-CM

## 2019-05-13 DIAGNOSIS — R8271 Bacteriuria: Secondary | ICD-10-CM

## 2019-05-13 LAB — COMPREHENSIVE METABOLIC PANEL
ALT: 26 U/L (ref 0–44)
AST: 31 U/L (ref 15–41)
Albumin: 3.2 g/dL — ABNORMAL LOW (ref 3.5–5.0)
Alkaline Phosphatase: 80 U/L (ref 38–126)
Anion gap: 11 (ref 5–15)
BUN: 16 mg/dL (ref 6–20)
CO2: 22 mmol/L (ref 22–32)
Calcium: 9 mg/dL (ref 8.9–10.3)
Chloride: 108 mmol/L (ref 98–111)
Creatinine, Ser: 1.25 mg/dL — ABNORMAL HIGH (ref 0.44–1.00)
GFR calc Af Amer: 54 mL/min — ABNORMAL LOW (ref >60)
GFR calc non Af Amer: 47 mL/min — ABNORMAL LOW (ref >60)
Glucose, Bld: 249 mg/dL — ABNORMAL HIGH (ref 70–99)
Potassium: 3.8 mmol/L (ref 3.5–5.1)
Sodium: 141 mmol/L (ref 135–145)
Total Bilirubin: 0.6 mg/dL (ref 0.3–1.2)
Total Protein: 7 g/dL (ref 6.5–8.1)

## 2019-05-13 LAB — CBC
HCT: 37.3 % (ref 36.0–46.0)
Hemoglobin: 12.6 g/dL (ref 12.0–15.0)
MCH: 31.3 pg (ref 26.0–34.0)
MCHC: 33.8 g/dL (ref 30.0–36.0)
MCV: 92.8 fL (ref 80.0–100.0)
Platelets: 149 10*3/uL — ABNORMAL LOW (ref 150–400)
RBC: 4.02 MIL/uL (ref 3.87–5.11)
RDW: 13 % (ref 11.5–15.5)
WBC: 8.3 10*3/uL (ref 4.0–10.5)
nRBC: 0 % (ref 0.0–0.2)

## 2019-05-13 LAB — URINE CULTURE: Culture: >=100000 — AB

## 2019-05-13 LAB — GLUCOSE, CAPILLARY
Glucose-Capillary: 232 mg/dL — ABNORMAL HIGH (ref 70–99)
Glucose-Capillary: 248 mg/dL — ABNORMAL HIGH (ref 70–99)
Glucose-Capillary: 235 mg/dL — ABNORMAL HIGH (ref 70–99)
Glucose-Capillary: 248 mg/dL — ABNORMAL HIGH (ref 70–99)

## 2019-05-13 MED ORDER — INSULIN DEGLUDEC 200 UNIT/ML ~~LOC~~ SOPN: 20.0000 [IU] | PEN_INJECTOR | Freq: Two times a day (BID) | SUBCUTANEOUS | Status: DC

## 2019-05-13 MED ORDER — INSULIN GLARGINE 100 UNIT/ML ~~LOC~~ SOLN
20.0000 [IU] | Freq: Every day | SUBCUTANEOUS | Status: DC
  Filled 2019-05-13: qty 0.2

## 2019-05-13 MED ORDER — INSULIN GLARGINE 100 UNIT/ML ~~LOC~~ SOLN
10.0000 [IU] | Freq: Every day | SUBCUTANEOUS | Status: DC
  Administered 2019-05-13: 10 [IU] via SUBCUTANEOUS
  Filled 2019-05-13 (×2): qty 0.1

## 2019-05-13 NOTE — TOC Initial Note (Signed)
Transition of Care [facility]) - Initial/Assessment Note    Patient Details  Name: Jennifer [name] MRN: [id number] Date of Birth: 05-Feb-1961  Transition of [facility]) CM/SW Contact:    Alexander [place], [place] Phone Number: 05/13/2019, 11:33 AM  Clinical Narrative:                 CSW spoke with pt son [name] via telephone [phone]). Introduced self, role, reason for call. Pt son confirmed pt from home with him post discharge from SNF. Pt son declines SNF stating "we not doing that again." Discussed pt needs. Pt son states she does have some equipment including a wheelchair at home. He would likely HH. Confirmed address and pcp.   RNCM aware and will assist CSW with arranging [facility] for pt.   Expected Discharge Plan: [place] Barriers to Discharge: Continued Medical Work up   Patient Goals and CMS Choice Patient states their goals for this hospitalization and ongoing recovery are:: for her to come home   Choice offered to / list presented to : Adult Children  Expected Discharge Plan and Services Expected Discharge Plan: [place] In-house Referral: Clinical Social Work Discharge Planning Services: CM Consult Post Acute Care Choice: Durable Medical Equipment, Home Health Living arrangements for the past 2 months: Single Family Home                           HH Arranged: PT, OT, RN          Prior Living Arrangements/Services Living arrangements for the past 2 months: Single Family Home Lives with:: Adult Children Patient language and need for interpreter reviewed:: Yes(no needs)        Need for Family Participation in Patient Care: Yes (Comment)(care and decision making) Care giver support system in place?: Yes (comment)(adult children) Current home services: DME Criminal Activity/Legal Involvement Pertinent to Current Situation/Hospitalization: No - Comment as needed  Activities of Daily Living Home Assistive Devices/Equipment:  None ADL Screening (condition at time of admission) Patient's cognitive ability adequate to safely complete daily activities?: No Is the patient deaf or have difficulty hearing?: No Does the patient have difficulty seeing, even when wearing glasses/contacts?: No Does the patient have difficulty concentrating, remembering, or making decisions?: No Patient able to express need for assistance with ADLs?: Yes Does the patient have difficulty dressing or bathing?: Yes Independently performs ADLs?: No Does the patient have difficulty walking or climbing stairs?: Yes Weakness of Legs: Both Weakness of Arms/Hands: Both  Permission Sought/Granted Permission sought to share information with : Case Manager, Family Supports Permission granted to share information with : No(pt confused, spoke with son [place])  Share Information with NAME: Adesuwa Osgood  Permission granted to share info w AGENCY: [place] granted to share info w Relationship: son  Permission granted to share info w Contact Information: [phone]  Emotional Assessment Appearance:: Other (Comment Required(spoke with pt son) Attitude/Demeanor/Rapport: Unable to Assess Affect (typically observed): Unable to Assess Orientation: : Oriented to Self, Fluctuating Orientation (Suspected and/or reported [place]) Alcohol / Substance Use: Tobacco Use Psych Involvement: No (comment)  Admission diagnosis:  AKI (acute kidney injury) ([place]) [N17.9] Urinary tract infection with hematuria, site unspecified [N39.0, R31.9] Patient Active Problem List   Diagnosis Date Noted  . Encephalopathy, toxic 05/11/2019  . Fatigue 04/06/2019  . Oropharyngeal candidiasis 03/25/2019  . Vulvar cysts 01/27/2019  . Urinary incontinence 05/13/2018  . OSA (obstructive sleep  apnea) 06/12/2017  . Morbid obesity with BMI of 40.0-44.9, adult ([place]) 03/26/2017  . Toe amputation status, right 01/16/2017  . Thrombocytopenia ([place]) 06/05/2016  .  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 Type 2 diabetes with nephropathy ([place]) 09/07/2015  . Uncontrolled diabetes mellitus with retinopathy, due to underlying condition, without macular edema ([place]) 09/05/2015  . Diabetic retinopathy ([place]) 09/05/2015  . Counseling regarding end of life decision making 06/14/2015  . Atherosclerosis of aorta ([place]) 04/04/2015  . Anemia 10/05/2014  . Chronic diastolic heart failure ([place])   . S/P BKA (below knee amputation) unilateral ([place])   . Tobacco abuse   . Severe obesity (BMI >= 40) ([place]) 03/02/2013  . Abnormality of gait 03/01/2013  . Healthcare maintenance 07/10/2012  . Chronic prescription opiate use 12/03/2011  . Secondary diabetes mellitus with peripheral vascular disease ([place]) 08/27/2011  . 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 Hypertension associated with diabetes ([place]) 11/29/2009  . Chronic insomnia 10/25/2009  . GASTROESOPHAGEAL REFLUX DISEASE 11/24/2008  . Depression, major, severe recurrence ([place]) 04/06/2008  . DM (diabetes mellitus) type II uncontrolled, periph vascular disorder ([place]) 04/02/2007  . Hyperlipidemia associated with type 2 diabetes mellitus ([place]) 01/08/2007  . COPD (chronic obstructive pulmonary disease) ([place]) 01/08/2007   PCP:  Bartholomew Crews, MD Pharmacy:   [id number] [place], [place]. [address] [address] Phone: [phone] Fax: [phone]  [place], [place] [address] [place] [address] [place] [address] Phone: [phone] Fax: [phone]     Social Determinants of Health (SDOH) Interventions    Readmission Risk Interventions No flowsheet data found.

## 2019-05-13 NOTE — Discharge Summary (Signed)
Name: SYRA SIRMONS MRN: [id number] DOB: Jan 10, 1961 58 y.o. PCP: Bartholomew Crews, MD  Date of Admission: 05/11/2019 12:42 PM Date of Discharge: 05/14/19 Attending Physician: Axel Filler, *  Discharge Diagnosis: Acute toxic encephalopathy Likely due to polypharmacy:  Acute kidney injury: DM II and left BKA: Bladder debris on US/Asymptomatic bacteriuria:   Discharge Medications: Allergies as of 05/14/2019      Reactions   Iohexol Other (See Comments)    Desc: IV CONTRAST CAUSE NEPHROPATHY IN 2007   Ivp Dye [iodinated Diagnostic Agents] Other (See Comments)   Desc: IV CONTRAST CAUSE NEPHROPATHY IN 2007   Abilify [aripiprazole] Other (See Comments)   Urinary freq Nov 2016   Morphine Sulfate Itching, Rash      Medication List    STOP taking these medications   acetaminophen 500 MG tablet Commonly known as:  TYLENOL   baclofen 10 MG tablet Commonly known as:  LIORESAL   cholecalciferol 1000 units tablet Commonly known as:  VITAMIN D   doxycycline 100 MG EC tablet Commonly known as:  DORYX   Easy Touch Pen Needles 31G X 8 MM Misc Generic drug:  Insulin Pen Needle   hydrochlorothiazide 25 MG tablet Commonly known as:  HYDRODIURIL   hydrOXYzine 25 MG tablet Commonly known as:  ATARAX/VISTARIL   multivitamin with minerals tablet   nicotine 14 mg/24hr patch Commonly known as:  NICODERM CQ - dosed in mg/24 hours   OneTouch Delica Plus [id number] Misc   OneTouch Verio test strip Generic drug:  glucose blood   OXYGEN   pregabalin 200 MG capsule Commonly known as:  LYRICA   Toviaz 8 MG Tb24 tablet Generic drug:  fesoterodine   traZODone 100 MG tablet Commonly known as:  DESYREL   vitamin B-12 100 MCG tablet Commonly known as:  CYANOCOBALAMIN   vitamin C 500 MG tablet Commonly known as:  ASCORBIC ACID     TAKE these medications   albuterol 108 (90 Base) MCG/ACT inhaler Commonly known as:  VENTOLIN HFA Inhale 1-2 puffs into the lungs  every 4 (four) hours as needed for wheezing or shortness of breath.   albuterol (2.5 MG/3ML) 0.083% nebulizer solution Commonly known as:  PROVENTIL Take 3 mLs (2.5 mg total) by nebulization every 6 (six) hours as needed for wheezing.   amLODipine-benazepril 10-40 MG capsule Commonly known as:  LOTREL TAKE 1 CAPSULE BY MOUTH EVERY DAY   atenolol 100 MG tablet Commonly known as:  TENORMIN TAKE 1 TABLET BY MOUTH EVERY DAY   betamethasone dipropionate 0.05 % cream Commonly known as:  DIPROLENE Apply 1 application topically 2 (two) times daily as needed (irritation).   buPROPion 300 MG 24 hr tablet Commonly known as:  WELLBUTRIN XL TAKE 1 TABLET BY MOUTH DAILY   cephALEXin 500 MG capsule Commonly known as:  KEFLEX Take 1 capsule (500 mg total) by mouth 4 (four) times daily.   DULoxetine 30 MG capsule Commonly known as:  CYMBALTA TAKE 1 CAPSULE BY MOUTH EVERY DAY What changed:  how much to take   fluticasone 50 MCG/ACT nasal spray Commonly known as:  FLONASE INSTILL 1 SPRAY IN EACH NOSTRIL DAILY   furosemide 40 MG tablet Commonly known as:  LASIX TAKE ONE TABLET BY MOUTH DAILY AS NEEDED What changed:  reasons to take this   Insulin Degludec 200 UNIT/ML Sopn Commonly known as:  [place] FlexTouch Inject 30 Units into the skin daily. What changed:  how much to take   ipratropium 0.02 % nebulizer  solution Commonly known as:  ATROVENT USE 1 VIAL VIA NEBULIZER EVERY 6 HOURS AS NEEDED FOR WHEEZING What changed:  See the new instructions.   metFORMIN 500 MG 24 hr tablet Commonly known as:  GLUCOPHAGE-XR TAKE 1 TABLET BY MOUTH EVERY DAY WITH BREAKFAST What changed:  See the new instructions.   mirabegron ER 50 MG Tb24 tablet Commonly known as:  Myrbetriq Take 1 tablet (50 mg total) by mouth daily.   nystatin powder Generic drug:  nystatin APPLY TO AFFECTED AREA THREE TIMES A DAY   omeprazole 20 MG capsule Commonly known as:  PRILOSEC TAKE 1 CAPSULE (20 MG TOTAL) BY  MOUTH 2 (TWO) TIMES DAILY BEFORE A MEAL.   ondansetron 4 MG tablet Commonly known as:  ZOFRAN Take 4 mg by mouth every 8 (eight) hours as needed for nausea or vomiting.   oxyCODONE-acetaminophen 5-325 MG tablet Commonly known as:  PERCOCET/ROXICET Take 1 tablet by mouth every 8 (eight) hours as needed for up to 30 days. May take a 4th pill if severe pain (up to 10 days monthly) #100 equals 30 day supply   rosuvastatin 20 MG tablet Commonly known as:  CRESTOR TAKE 1 TABLET BY MOUTH AT BEDTIME What changed:  when to take this   umeclidinium-vilanterol 62.5-25 MCG/INH Aepb Commonly known as:  Anoro Ellipta Inhale 1 puff into the lungs daily.       Disposition and follow-up:   Ms.Jevon L Mackowiak was discharged from [facility] in Stable condition.  At the hospital follow up visit please address:  Acute toxic encephalopathy:  Likely due to polypharmacy:  -reconcile medication list with patient, she is instructed to bring all her medications in to her follow up appt  Acute kidney injury:pre renal improved with IVF  -continued benazepril but held hctz as pt almost normotensive without both meds -can reevaluate  DM II and left BKA:    -ensure pt continuing to titrate her insulin -examine stump  Bladder debris on US/Asymptomatic bacteriuria: Urine culture positive for Entrecoccous Faecalis   -will need follow up imaging vs cystoscopy to evaluate debris for resolution  2.  Labs / imaging needed at time of follow-up: bmp  3.  Pending labs/ test needing follow-up: none  Follow-up Appointments: Follow-up Information    Bartholomew Crews, MD In 2 days.   Specialty:  Internal Medicine Contact information: [place] [id number] [place] Hospital Course by problem list: Acute toxic encephalopathy Likely due to polypharmacy:  Acute kidney injury: DM II and left BKA: Bladder debris on US/Asymptomatic  bacteriuria:   Patient presented with toxic encephalopathy from her assisted living facility.  She was found to have been on multiple centrally acting medications, the rest of her work-up was negative with the exception of a urinalysis and urine culture that was positive for Enterococcus faecalis.  The patient had no systemic signs of infection and no urinary symptoms after a dose of ceftriaxone her antibiotics were discontinued.  She was also found to have a mild acute kidney injury, the workup included a renal US which was positive for debris but no signs of hydronephrosis or urinary retention that responded to IV fluid hydration. Her centrally acting medications were held and we saw her encephalopathy improve incrementally throughout her stay.  It likely took longer than we expected likely due to her acute kidney injury.  On the day of discharge she was fully alert and oriented,  she had good executive functioning and spoke clearly, answering all questions appropriately.  The patient and her family voiced the desire for her to return home in lieu of returning to the skilled nursing facility.  After speaking with her she lives with 8 family members including her children and her niece and nephew, someone is at the house to help her at all times according to the patient.  The patient also agreed to home health nursing and physical therapy which was ordered for her on discharge.  She was discharged with close follow-up in our clinic.  Discharge Vitals:   BP (!) 155/73 (BP Location: Right Arm)   Pulse 81   Temp 98.9 F (37.2 C) (Oral)   Resp 18   Ht 5\' 7"  (1.702 m)   Wt 109.5 kg   SpO2 98%   BMI 37.81 kg/m   Pertinent Labs, Studies, and Procedures:  CBC Latest Ref Rng & Units 05/14/2019 05/13/2019 05/12/2019  WBC 4.0 - 10.5 K/uL 8.8 8.3 12.4(H)  Hemoglobin 12.0 - 15.0 g/dL 12.1 12.6 12.3  Hematocrit 36.0 - 46.0 % 36.7 37.3 37.9  Platelets 150 - 400 K/uL 137(L) 149(L) 154   BMP Latest Ref Rng &  Units 05/14/2019 05/13/2019 05/12/2019  Glucose 70 - 99 mg/dL 276(H) 249(H) 199(H)  BUN 6 - 20 mg/dL 17 16 23(H)  Creatinine 0.44 - 1.00 mg/dL 1.18(H) 1.25(H) 1.04(H)  BUN/Creat Ratio 9 - 23 - - -  Sodium 135 - 145 mmol/L 139 141 144  Potassium 3.5 - 5.1 mmol/L 4.1 3.8 3.6  Chloride 98 - 111 mmol/L 105 108 110  CO2 22 - 32 mmol/L 20(L) 22 22  Calcium 8.9 - 10.3 mg/dL 8.9 9.0 9.3   CLINICAL DATA:  58 year old female with acute kidney injury.  EXAM: RENAL / URINARY TRACT ULTRASOUND COMPLETE  COMPARISON:  Lumbar MRI 06/13/2015.  FINDINGS: Right Kidney:  Renal measurements: 10.7 x 5.8 x 6.8 centimeters = volume: 220 mL. Normal cortical echogenicity. No right hydronephrosis. Simple appearing 2.5 centimeter right renal cyst (image 8).  Left Kidney:  Renal measurements: 10.9 x 6.0 x 6.4 centimeters = volume: 221 mL. Normal cortical echogenicity. No left hydronephrosis or renal mass.  Bladder:  Irregular echogenic debris in the urinary bladder (image 13) without apparent vascular elements. This encompasses 6 centimeters. No superimposed bladder wall thickening is evident.  IMPRESSION: 1. Irregular echogenic material in the urinary bladder appears to be some sort of debris (query hematuria, blood clot) rather than a solid mass. 2. Negative ultrasound appearance of both kidneys.   Electronically Signed   By: Genevie Ann M.D.   On: 05/12/2019 00:01  CT head  CLINICAL DATA:  Altered mental status.  EXAM: CT HEAD WITHOUT CONTRAST  TECHNIQUE: Contiguous axial images were obtained from the base of the skull through the vertex without intravenous contrast.  COMPARISON:  CT scan of January 06, 2017.  FINDINGS: Brain: No evidence of acute infarction, hemorrhage, hydrocephalus, extra-axial collection or mass lesion/mass effect.  Vascular: No hyperdense vessel or unexpected calcification.  Skull: Normal. Negative for fracture or focal  lesion.  Sinuses/Orbits: No acute finding.  Other: None.  IMPRESSION: Normal head CT.   Electronically Signed   By: Marijo Conception M.D.   On: 05/11/2019 14:17  Discharge Instructions: Discharge Instructions    Call MD for:  difficulty breathing, headache or visual disturbances   Complete by:  As directed    Call MD for:  extreme fatigue   Complete by:  As directed  Call MD for:  hives   Complete by:  As directed    Call MD for:  persistant dizziness or light-headedness   Complete by:  As directed    Call MD for:  persistant nausea and vomiting   Complete by:  As directed    Call MD for:  redness, tenderness, or signs of infection (pain, swelling, redness, odor or green/yellow discharge around incision site)   Complete by:  As directed    Call MD for:  severe uncontrolled pain   Complete by:  As directed    Call MD for:  temperature >100.4   Complete by:  As directed    Diet - low sodium heart healthy   Complete by:  As directed    Discharge instructions   Complete by:  As directed    Ms. Feldt, your mental status is greatly improved.  He will be important to limit the amount of medications that you are taking can alter your mentation.  I provided you with a detailed list of which medications to avoid for the time being until you follow-up with your primary doctor.  You will need to follow-up in our clinic early next week.  I will be arranging for a home health nurse and physical therapist to help you with your medications and your ambulation, balance.  I understand that he may need to adjust your insulin dosage I have started you at a slightly lower dosage than you usually take at home for safety purposes as you did not require much insulin here in the hospital.  When you are at home eating your own food and on your own diet you may need to adjust back to your normal dosage.   Increase activity slowly   Complete by:  As directed       Signed: Katherine Roan,  MD 05/14/2019, 10:25 AM

## 2019-05-13 NOTE — Plan of Care (Addendum)
  Problem: Elimination: Goal: Will not experience complications related to bowel motility Outcome: Progressing   Problem: Safety: Goal: Ability to remain free from injury will improve Outcome: Progressing      Pt calmed down after speaking with son Jennifer [name] :[phone] Son would like nursing staff to call and give shift updates if possible. Day shift nurse made aware.

## 2019-05-13 NOTE — Progress Notes (Signed)
Physical Therapy Treatment Patient Details Name: Jennifer [name] MRN: [id number] DOB: Oct 17, 1961 Today's Date: 05/13/2019    History of Present Illness Pt is a 58 y/o female admitted secondary to AMS. 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    PT Comments    Pt performed functional mobility and progressed to edge of bed with min guard assistance.  She required max to mod verbal cues for set-up and positioning to prepare for transfer OOB and into recliner chair.  Pt is slow and guarded but moving much better,  She continues to benefit from skilled rehab in a post acute setting to improve strength and function before returning home.      Follow Up Recommendations  SNF     Equipment Recommendations  None recommended by PT    Recommendations for Other Services       Precautions / Restrictions Precautions Precautions: Fall Precaution Comments: prior L transtibial amputation Restrictions Weight Bearing Restrictions: No    Mobility  Bed Mobility Overal bed mobility: Needs Assistance Bed Mobility: Supine to Sit     Supine to sit: Min guard     General bed mobility comments: Pt able to follow commands for hand placement to move toward edge of bed with increased time and effort.  Once seated edge of bed she required cues to scoot forward to allow her R foot to touch the floor.    Transfers Overall transfer level: Needs assistance Equipment used: None(FACE to [facility]) Transfers: Sit to/from [facility] Sit to Stand: Mod assist;+2 safety/equipment Stand pivot transfers: Mod assist;+2 safety/equipment       General transfer comment: Pt performed stand pivot from bed to recliner chair.  Once in standing require moderate assistance to pivot to chair.    Ambulation/Gait Ambulation/Gait assistance: (NT.  )               Stairs             Wheelchair Mobility    Modified Rankin (Stroke  Patients Only)       Balance Overall balance assessment: Needs assistance Sitting-balance support: Bilateral upper extremity supported Sitting balance-Leahy Scale: Poor                                      Cognition Arousal/Alertness: Awake/alert Behavior During Therapy: Restless;Anxious Overall Cognitive Status: Difficult to assess                                 General Comments: pt able to follow simple one-step commands with multimodal cueing, [facility] for session.        Exercises      General Comments        Pertinent Vitals/Pain Pain Assessment: Faces Faces Pain Scale: No hurt    Home Living                      Prior Function            PT Goals (current goals can now be found in the care plan section) Acute Rehab PT Goals Patient Stated Goal: unable to state Potential to Achieve Goals: Fair Progress towards PT goals: Progressing toward goals    Frequency    Min 2X/week      PT Plan Current plan  remains appropriate    Co-evaluation              AM-PAC PT "6 Clicks" Mobility   Outcome Measure  Help needed turning from your back to your side while in a flat bed without using bedrails?: A Little Help needed moving from lying on your back to sitting on the side of a flat bed without using bedrails?: A Little Help needed moving to and from a bed to a chair (including a wheelchair)?: A Little Help needed standing up from a chair using your arms (e.g., wheelchair or bedside chair)?: A Lot Help needed to walk in hospital room?: Total Help needed climbing 3-5 steps with a railing? : Total 6 Click Score: 13    End of Session Equipment Utilized During Treatment: Gait belt Activity Tolerance: Patient tolerated treatment well Patient left: in chair;with call bell/phone within reach;with chair alarm set Nurse Communication: Mobility status PT Visit Diagnosis: Other abnormalities of gait and mobility (R26.89)      Time: 1515-1540 PT Time Calculation (min) (ACUTE ONLY): 25 min  Charges:  $Therapeutic Activity: 23-37 mins                     Governor Rooks, PTA Acute Rehabilitation Services Pager [phone] Office [phone]     [place] Morreale Eli Hose 05/13/2019, 4:35 PM

## 2019-05-13 NOTE — Progress Notes (Addendum)
   Subjective:  Jennifer [name] is doing better. She is comfortable. No acute event overnight. Denies nay pain. She is able to name her son's name but does not know where she is now. She has no complaint.   Objective:  Vital signs in last 24 hours: Vitals:   05/12/19 0413 05/12/19 1129 05/12/19 1927 05/13/19 0508  BP: (!) 158/75  128/68 (!) 152/77  Pulse: 62  69 65  Resp: 11  15 10   Temp: (!) 97.5 F (36.4 C)  98.2 F (36.8 C) 98 F (36.7 C)  TempSrc: Oral  Oral Oral  SpO2: 97% 97% 100% 99%  Weight:      Height:       Ph/E: General: Sitting in the bed in no acute distress, Alert, confused, oriented to person. CV: RRR, no murmur Abdominal exam: Soft, nontender to palpation. Psychiatric exam: She is calm today with normal mood.  Extremities: Left BKA. No major erythema, swelling or acute ulceration at stump area  Assessment/Plan:  Principal Problem:   Encephalopathy, toxic Active Problems:   DM (diabetes mellitus) type II uncontrolled, periph vascular disorder (HCC)   Depression, major, severe recurrence (HCC)   S/P BKA (below knee amputation) unilateral ([place])  Jennifer [name] is a 58 year old female [id number] complicated by diabetic retinopathy and peripheral vascular disease, status post leftBKA, HTN, COPD, OSA, MDD, and morbid obesity who presents with  acute toxic encephalopathy.  Acute toxic encephalopathy:  Likely due to poly pharmacy  Mental status improved today.  She is still confused. I called her son and provided update. -Holding Baclofen, Trazodone, Hydroxyzine, lyric  Acute kidney injury: Overthepast month, Crhas increased from a baseline of ~1.0 to 1.4. Renal ultrasound with bladder debries. Creatinine fluctuating since arrival. Today Cr is 1.2. Hope to improve when resume Po intake and hydrated. Will consider  Encouraging to eat and drink today. -Hold home benazepril (ACEI)and HCTZ -BMP daily  DM II and left BKA:  She is on metformin XR 500 mg daily,  andTresiba66units daily at home. BG at 200s in AM and evening.  Starting small dose of long acting, will need to be escalated to home dose gradually if she eats. -Lantus 10 u QD. Will escalated as needed when start to eat well -CBG monitoring -HbA1c -Continue moderate SSI  Asymptomatic bacteriuria: She denies any urinary symptoms.  BC + for Entrecoccous Faecalis  -May consider PO antibiotic if becomes symptomatic  Dispo: Anticipated discharge in approximately 2-3 days  Dewayne Hatch, MD 05/13/2019, 6:53 AM Pager: [phone]

## 2019-05-13 NOTE — TOC Progression Note (Signed)
Transition of Care [facility]) - Progression Note    Patient Details  Name: DEAYSIA GRIGORYAN MRN: [id number] Date of Birth: 12/25/1960  Transition of Care [facility]) CM/SW Contact  Ella Bodo, RN Phone Number: 05/13/2019, 1:54 PM  Clinical Narrative:  Pt is a 58 y/o female admitted secondary to AMS.  PTA, pt requires assistance with ADLS; lives with son, [name].  Pt confused; spoke with son by phone to discuss dc arrangements, as he prefers pt to dc home with [facility] services over SNF.  Son states pt has used [facility] in the past, and they prefer to use again.  He also states that pt's other son and her sister will be assisting him with her care.  DME at home includes WC, RW, BSC and shower chair.  Son denies need for any other DME.  Referral to [facility] for [facility] follow up; start of care 24-48h post dc date.  Will request orders from attending MD.       Expected Discharge Plan: [place] Barriers to Discharge: Continued Medical Work up  Expected Discharge Plan and Services Expected Discharge Plan: [place] In-house Referral: Clinical Social Work Discharge Planning Services: CM Consult Post Acute Care Choice: Durable Medical Equipment, Home Health Living arrangements for the past 2 months: Single Family Home                           HH Arranged: RN, PT, OT, Nurse's Aide [place] Agency: [place] ([facility]) Date HH Agency Contacted: 05/13/19 Time Onset: 1232 Representative spoke with at [place]: Neoma Laming    Readmission Risk Interventions No flowsheet data found.  Reinaldo Raddle, RN, BSN  Trauma/Neuro ICU Case Manager [phone]

## 2019-05-14 DIAGNOSIS — Z6837 Body mass index (BMI) 37.0-37.9, adult: Secondary | ICD-10-CM

## 2019-05-14 LAB — CBC
HCT: 36.7 % (ref 36.0–46.0)
Hemoglobin: 12.1 g/dL (ref 12.0–15.0)
MCH: 31.2 pg (ref 26.0–34.0)
MCHC: 33 g/dL (ref 30.0–36.0)
MCV: 94.6 fL (ref 80.0–100.0)
Platelets: 137 10*3/uL — ABNORMAL LOW (ref 150–400)
RBC: 3.88 MIL/uL (ref 3.87–5.11)
RDW: 13.2 % (ref 11.5–15.5)
WBC: 8.8 10*3/uL (ref 4.0–10.5)
nRBC: 0 % (ref 0.0–0.2)

## 2019-05-14 LAB — COMPREHENSIVE METABOLIC PANEL WITH GFR
ALT: 23 U/L (ref 0–44)
AST: 26 U/L (ref 15–41)
Albumin: 3.1 g/dL — ABNORMAL LOW (ref 3.5–5.0)
Alkaline Phosphatase: 72 U/L (ref 38–126)
Anion gap: 14 (ref 5–15)
BUN: 17 mg/dL (ref 6–20)
CO2: 20 mmol/L — ABNORMAL LOW (ref 22–32)
Calcium: 8.9 mg/dL (ref 8.9–10.3)
Chloride: 105 mmol/L (ref 98–111)
Creatinine, Ser: 1.18 mg/dL — ABNORMAL HIGH (ref 0.44–1.00)
GFR calc Af Amer: 58 mL/min — ABNORMAL LOW
GFR calc non Af Amer: 50 mL/min — ABNORMAL LOW
Glucose, Bld: 276 mg/dL — ABNORMAL HIGH (ref 70–99)
Potassium: 4.1 mmol/L (ref 3.5–5.1)
Sodium: 139 mmol/L (ref 135–145)
Total Bilirubin: 0.5 mg/dL (ref 0.3–1.2)
Total Protein: 6.8 g/dL (ref 6.5–8.1)

## 2019-05-14 LAB — GLUCOSE, CAPILLARY
Glucose-Capillary: 234 mg/dL — ABNORMAL HIGH (ref 70–99)
Glucose-Capillary: 259 mg/dL — ABNORMAL HIGH (ref 70–99)

## 2019-05-14 MED ORDER — INSULIN DEGLUDEC 200 UNIT/ML ~~LOC~~ SOPN: 30.0000 [IU] | PEN_INJECTOR | Freq: Every day | SUBCUTANEOUS | 2 refills | Status: DC

## 2019-05-14 MED ORDER — INSULIN GLARGINE 100 UNIT/ML ~~LOC~~ SOLN
12.0000 [IU] | Freq: Every day | SUBCUTANEOUS | Status: DC
  Administered 2019-05-14: 12 [IU] via SUBCUTANEOUS
  Filled 2019-05-14: qty 0.12

## 2019-05-14 MED ORDER — INSULIN ASPART 100 UNIT/ML ~~LOC~~ SOLN: 4.0000 [IU] | Freq: Three times a day (TID) | SUBCUTANEOUS | Status: DC

## 2019-05-14 NOTE — Plan of Care (Signed)
  Problem: Elimination: Goal: Will not experience complications related to bowel motility Outcome: Progressing   Problem: Safety: Goal: Ability to remain free from injury will improve Outcome: Progressing   

## 2019-05-14 NOTE — Progress Notes (Signed)
  Date: 05/14/2019  Patient name: Jennifer [name]  Medical record number: [id number]  Date of birth: [birth year]   I have seen and evaluated this patient and I have discussed the plan of care with the house staff. Please see Dr. Maudie Flakes note for complete details. I concur with his findings and plan.  We discussed this morning why she has been getting confused, as she is on her normal medications.  I explained to her that some medications will be more potent as she ages and some may stay around longer in her system if her kidneys are not working properly.  She understood this explanation.  She plans to follow up with Dr. [name], her PCP on Thursday.    Sid Falcon, MD 05/14/2019, 11:32 AM

## 2019-05-14 NOTE — Progress Notes (Addendum)
   Subjective:  Pt reports her mind is much clearer today.  She knows that she is here in the hospital because she was confused.    Objective:  Vital signs in last 24 hours: Vitals:   05/13/19 0508 05/13/19 0841 05/13/19 2036 05/14/19 0416  BP: (!) 152/77  (!) 152/71 (!) 137/54  Pulse: 65  68 64  Resp: 10  18 18   Temp: 98 F (36.7 C)  98.2 F (36.8 C) 98.9 F (37.2 C)  TempSrc: Oral  Oral Oral  SpO2: 99% 99% 99% 97%  Weight:      Height:       Cardiac: normal rate and rhythm, clear s1 and s2, no murmurs, rubs or gallops Pulmonary: CTAB, not in distress Abdominal: non distended abdomen, soft and nontender Extremities: Left BKA. No major erythema, site looks clean and dry Psych: Alert, conversant, oriented x 4, she adds change quickly and correctly, she tells me the similarity between an apple and an orange is that they are both fruit.    Assessment/Plan:  Principal Problem:   Encephalopathy, toxic Active Problems:   DM (diabetes mellitus) type II uncontrolled, periph vascular disorder (HCC)   Depression, major, severe recurrence (HCC)   S/P BKA (below knee amputation) unilateral ([place])  Jennifer [name] is a 58 year old female [id number] complicated by diabetic retinopathy and peripheral vascular disease, status post leftBKA, HTN, COPD, OSA, MDD, and morbid obesity who presents with  acute toxic encephalopathy.  Acute toxic encephalopathy:  Likely due to polypharmacy: Back to baseline. Patient and her family would like her to go home  -Holding Baclofen, Trazodone, Hydroxyzine, lyrica  Acute kidney injury:resolved, RUS shows some debris  -pt can d/c on benazepril but will continue to hold hctz as pt almost normotensive without both meds  DM II and left BKA: Seems like a large dose of tresiba at home, not requiring much here but she isn't eating her full meals and likely eating less sugar containing products.    -patient seems to titrate her own insulin at home.  Will  recommend that she starts at a lower dose until eating regularly -increased basal bolus to 12 and 4 with SSI mod while here today  Bladder debris on US/Asymptomatic bacteriuria: She denies any urinary symptoms.  [facility] culture positive for Entrecoccous Faecalis   -will need follow up imaging vs cystoscopy to evaluate debris for resolution  Dispo: Anticipated discharge today  Katherine Roan, MD 05/14/2019, 7:11 AM

## 2019-05-14 NOTE — Progress Notes (Signed)
The patient is requesting an Albuterol inhaler for home use. Will notify RN.

## 2019-05-14 NOTE — Progress Notes (Signed)
Pt able to transfer to [facility] with assist x2. Able to pivot with RLE with success. Pt with bowel movements x2, (one large, one small) both formed and brown.   Pt noted with more clarity, no confusion during this shift. Tele monitor SR 68 with BBB. Maintained Bed exit alarm, call light and phone in reach. Rounds continued per unit policy.

## 2019-05-17 DIAGNOSIS — Z9181 History of falling: Secondary | ICD-10-CM | POA: Diagnosis not present

## 2019-05-17 DIAGNOSIS — E1151 Type 2 diabetes mellitus with diabetic peripheral angiopathy without gangrene: Secondary | ICD-10-CM | POA: Diagnosis not present

## 2019-05-17 DIAGNOSIS — Z89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): Secondary | ICD-10-CM | POA: Diagnosis not present

## 2019-05-17 DIAGNOSIS — T8781 Dehiscence of amputation stump: Secondary | ICD-10-CM | POA: Diagnosis not present

## 2019-05-17 DIAGNOSIS — E11319 Type 2 diabetes mellitus with unspecified diabetic retinopathy without macular edema: Secondary | ICD-10-CM | POA: Diagnosis not present

## 2019-05-17 DIAGNOSIS — G894 Chronic pain syndrome: Secondary | ICD-10-CM | POA: Diagnosis not present

## 2019-05-17 DIAGNOSIS — Z794 Long term (current) use of insulin: Secondary | ICD-10-CM | POA: Diagnosis not present

## 2019-05-17 DIAGNOSIS — I5032 Chronic diastolic (congestive) heart failure: Secondary | ICD-10-CM | POA: Diagnosis not present

## 2019-05-17 DIAGNOSIS — T879 Unspecified complications of amputation stump: Secondary | ICD-10-CM | POA: Diagnosis not present

## 2019-05-17 DIAGNOSIS — M4696 Unspecified inflammatory spondylopathy, lumbar region: Secondary | ICD-10-CM | POA: Diagnosis not present

## 2019-05-17 DIAGNOSIS — Z89512 Acquired absence of left leg below knee: Secondary | ICD-10-CM | POA: Diagnosis not present

## 2019-05-17 DIAGNOSIS — J449 Chronic obstructive pulmonary disease, unspecified: Secondary | ICD-10-CM | POA: Diagnosis not present

## 2019-05-17 DIAGNOSIS — E1140 Type 2 diabetes mellitus with diabetic neuropathy, unspecified: Secondary | ICD-10-CM | POA: Diagnosis not present

## 2019-05-17 DIAGNOSIS — H42 Glaucoma in diseases classified elsewhere: Secondary | ICD-10-CM | POA: Diagnosis not present

## 2019-05-17 DIAGNOSIS — E1165 Type 2 diabetes mellitus with hyperglycemia: Secondary | ICD-10-CM | POA: Diagnosis not present

## 2019-05-17 DIAGNOSIS — G4733 Obstructive sleep apnea (adult) (pediatric): Secondary | ICD-10-CM | POA: Diagnosis not present

## 2019-05-17 DIAGNOSIS — I152 Hypertension secondary to endocrine disorders: Secondary | ICD-10-CM | POA: Diagnosis not present

## 2019-05-17 DIAGNOSIS — M797 Fibromyalgia: Secondary | ICD-10-CM | POA: Diagnosis not present

## 2019-05-17 DIAGNOSIS — I700 Atherosclerosis of aorta: Secondary | ICD-10-CM | POA: Diagnosis not present

## 2019-05-17 DIAGNOSIS — E1139 Type 2 diabetes mellitus with other diabetic ophthalmic complication: Secondary | ICD-10-CM | POA: Diagnosis not present

## 2019-05-17 DIAGNOSIS — E1121 Type 2 diabetes mellitus with diabetic nephropathy: Secondary | ICD-10-CM | POA: Diagnosis not present

## 2019-05-18 ENCOUNTER — Other Ambulatory Visit: Payer: Self-pay

## 2019-05-18 ENCOUNTER — Ambulatory Visit (INDEPENDENT_AMBULATORY_CARE_PROVIDER_SITE_OTHER): Payer: Medicare Other | Admitting: Internal Medicine

## 2019-05-18 ENCOUNTER — Ambulatory Visit (INDEPENDENT_AMBULATORY_CARE_PROVIDER_SITE_OTHER): Payer: Medicare Other | Admitting: Family

## 2019-05-18 ENCOUNTER — Encounter: Payer: Self-pay | Admitting: Family

## 2019-05-18 VITALS — BP 112/49 | HR 78 | Temp 98.6°F | Ht 67.0 in | Wt 243.8 lb

## 2019-05-18 VITALS — Ht 67.0 in | Wt 241.4 lb

## 2019-05-18 DIAGNOSIS — E1151 Type 2 diabetes mellitus with diabetic peripheral angiopathy without gangrene: Secondary | ICD-10-CM | POA: Diagnosis not present

## 2019-05-18 DIAGNOSIS — E1165 Type 2 diabetes mellitus with hyperglycemia: Secondary | ICD-10-CM | POA: Diagnosis not present

## 2019-05-18 DIAGNOSIS — G92 Toxic encephalopathy: Secondary | ICD-10-CM | POA: Diagnosis not present

## 2019-05-18 DIAGNOSIS — Z794 Long term (current) use of insulin: Secondary | ICD-10-CM

## 2019-05-18 DIAGNOSIS — IMO0002 Reserved for concepts with insufficient information to code with codable children: Secondary | ICD-10-CM

## 2019-05-18 DIAGNOSIS — Z89512 Acquired absence of left leg below knee: Secondary | ICD-10-CM

## 2019-05-18 DIAGNOSIS — Z79899 Other long term (current) drug therapy: Secondary | ICD-10-CM

## 2019-05-18 DIAGNOSIS — G929 Unspecified toxic encephalopathy: Secondary | ICD-10-CM

## 2019-05-18 DIAGNOSIS — R8271 Bacteriuria: Secondary | ICD-10-CM | POA: Diagnosis not present

## 2019-05-18 DIAGNOSIS — N179 Acute kidney failure, unspecified: Secondary | ICD-10-CM | POA: Diagnosis not present

## 2019-05-18 LAB — GLUCOSE, CAPILLARY: Glucose-Capillary: 337 mg/dL — ABNORMAL HIGH (ref 70–99)

## 2019-05-18 LAB — POCT GLYCOSYLATED HEMOGLOBIN (HGB A1C): Hemoglobin A1C: 9.8 % — AB (ref 4.0–5.6)

## 2019-05-18 NOTE — Patient Instructions (Addendum)
Thank you for coming to the clinic today. It was a pleasure to see you.   Please consider stoping the lyrica and toviaz  Please continue to hold the HCTZ  Please consider reducing the tresiba to 70 units daily  Please call the internal medicine center clinic if you have any questions or concerns, we may be able to help and keep you from a long and expensive emergency room wait. Our clinic and after hours phone number is [phone], the best time to call is Monday through Friday 9 am to 4 pm but there is always someone available 24/7 if you have an emergency. If you need medication refills please notify your pharmacy one week in advance and they will send [place] a request.

## 2019-05-18 NOTE — Telephone Encounter (Signed)
Would you pls call pharmacy and ask all refills on the meds stopped on the 5/22 D/C be deleted? Thanks

## 2019-05-18 NOTE — Progress Notes (Signed)
CC: follow up after hospitalization for encephalopathy   HPI:  [name] is a 58 y.o. with PMH as listed below who presents for follow up after hospitalization for encephalopathy . Please see the assessment and plans for the status of the patient chronic medical problems.   Past Medical History:  Diagnosis Date  . Anginal pain [facility])    '3' of 10 ischemia ruled out 9/9   . Arthritis of lumbar spine   . Asthma   . Cataract   . CHF (congestive heart failure) ([place])   . 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 Chronic bronchitis ([place])    "I get it alot" (09/28/2013)  . Chronic diastolic heart failure (HCC)    grade 2 per 2D echocardiogram (01/2013)  . Chronic lower back pain   . Chronic osteomyelitis of foot (HCC)    chronic, right secondary to diabetic foot ulcers  . Chronic pain syndrome 12/03/2011   Likely secondary to depression, "fibromyalgia", neuropathy, and obesity. Lumbar MRI 2014 no sig change from prior (2008) : Stable hypertrophic facet disease most notable at L4-5. Stable shallow left foraminal/extraforaminal disc protrusion at L4-5. No direct neural compression.      Marland Kitchen COPD 01/08/2007   PFT's 05/2007 : FEV1/FVC 82, FEV1 64% pred, FEF 25-75% 40% predicted, 16% improvement in FEV1 with bronchodilators.     . Depression   . Diabetes mellitus without complication ([place])    Type II  . Diabetic peripheral neuropathy ([place])   . DVT of upper extremity (deep vein thrombosis) ([place]) 03/11/2013   Secondary to PICC line. Right brachial vein, diagnosed on 03/10/2013 Coumadin for 3 months. End date 06/10/2013   . Dyspnea    "smoker"  . Environmental allergies    Hx: of  . Exertional shortness of breath   . Fatty liver 2003   observed on ultrasound abdomen  . Fibromyalgia   . GERD (gastroesophageal reflux disease)   . Glaucoma   . History of use of hearing aid   . Hyperlipidemia   . Hyperplastic colon polyp 12/2010   Per colonoscopy (12/2010) - Dr. Deatra Ina  .  Hypertension   . 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 Blood cultures 01/26/13 S. Aureus and GBS. Blood cultures Feb 6th, 8th, and 9th and March were negative.Repeat TEE 3/20 negative for vegitations  . 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 2/2 tobacco use, DM, and cont weight bearing on surgical wound and developed gangrene   . Pneumonia   . Polymicrobial bacterial infection 01/2013   GBS and S. aureus bacteremia // Source likely infected diabetic foot ulcer  . PVD (peripheral vascular disease) with claudication ([place])    Stents to bilateral common iliac arteries (left 2005, right 2008), on chronic plavix  . Rheumatoid arthritis ([place])   . S/P BKA (below knee amputation) unilateral [facility])    Oct 2014 L - failed limb preserving treatment. 2/2 tobacco use, DM, and cont weight bearing on surgical wound and developed gangrene   . Tobacco abuse   . Type II diabetes mellitus with peripheral circulatory disorders, uncontrolled DX: 1993   Insulin dep. Poor control. Complicated by diabetic foot ulcer and diabetic eye disease.    Marland Kitchen Ulcer of foot, chronic (HCC)    Left. No OM per MRI (01/2013)   Review of Systems:  Refer to history of present illness  and assessment and plans for pertinent review of systems, all others reviewed and negative  Physical Exam:  Vitals:   05/18/19 1459 05/18/19 1501  BP:  (!) 112/49  Pulse:  78  Temp:  98.6 F (37 C)  TempSrc:  Oral  SpO2:  100%  Weight: 243 lb 12.8 oz (110.6 kg) 243 lb 12.8 oz (110.6 kg)  Height: 5\' 7"  (1.702 m) 5\' 7"  (1.702 m)   General: well appearing, no acute distress Eyes: no conjunctivitis, no jaundice  Cardiac: regular rate and rhythm, no murmurs, rubs, or gallops, 1+ right lower extremity pitting edema, left lower extremity BKA  Pulm: lungs are clear to auscultation, no wheezes or rhonchi  GI: the abdomen is  soft, non tender, non distended  Skin: no rashes evident on the exposed skin   Assessment & Plan:   Acute toxic encephalopathy  Patient was recently hospitalized 5/20-5/23 for toxic encephalopathy. Workup was overall unremarkable except for asymptomatic bacteriuria with enterococcus faecalis. Her encephalopathy was thought to be related to multiple centrally acting medications and she was found to have improvement in mental status with holding of these medications. She was asked to stop taking baclofen, Hydroxyzine, pregabalin, toviaz, and trazodone at the time of discharge. She was prescribed home health nursing and physical therapy and presents today for follow up.  She reports that she has continued to take lyrica and Lisbeth Ply but has otherwise followed the recommendations.   Acute kidney injury  Patient was noted to have prerenal acute kidney injury which improved to baseline with IVF. She was found to be normotensive despite holding home benazepril and HCTZ. At the time of discharge she was asked to stop taking HCTZ. Today her blood pressure remains well controlled off of the HCTZ.   Type 2 Diabetes Mellitus  Last hemoglobin A1c 9.3 when checked three months ago. Currently she is prescribed tresiba 30 units daily and metformin 500 mg daily.  She explains that prior to being hospitalized she was taking 120 units of insulin in the morning and 85 at night. She found that 30 units was not working to control her blood glucose so she went back to taking her original dose.  Hemoglobin A1c is 9.8 today, review of prior notes is not consistent with the dosing of tresiba she describes, instead I can see that she was prescribed 80 units daily. It seems that while she was in the hospital for encephalopathy and not eating as much her blood glucose was controlled with basal 12 units daily.  - will change tresiba recommended dose to 70 units daily   See Encounters Tab for problem based charting.  Patient  discussed with Dr. Angelia Mould

## 2019-05-18 NOTE — Progress Notes (Signed)
Post-Op Visit Note   Patient: Jennifer [name]           Date of Birth: 1961-09-02           MRN: [id number] Visit Date: 05/18/2019 PCP: Bartholomew Crews, MD  Chief Complaint:  Chief Complaint  Patient presents with  . Left Leg - Routine Post Op    04/01/2019 revision left BKA    HPI:  HPI The patient is a 58 year old woman seen today for postop visit.  She is status post revision of her left below the knee amputation on April 10 of this year.  She does not yet have a shrinker does have swelling to her residual limb.  She has been doing daily cleansing and dry dressings.  Has an appointment with biotech for tomorrow.  Ortho Exam Her left residual limb is consolidating well.  The incision is healing well there is a quarter sized area with superficial eschar.  There is no drainage no surrounding erythema minimal tenderness no sign of infection this is healing well.  Visit Diagnoses:  1. Acquired absence of left leg below knee (HCC)     Plan: Continue daily Dial soap cleansing may use Neosporin or dry dressing.  Follow-up with biotech for prosthesis set up.  Once shrinker is obtained wear this daily.  Follow-up in the office in 2 months.  Follow-Up Instructions: Return in about 2 months (around 07/18/2019).   Imaging: No results found.  Orders:  No orders of the defined types were placed in this encounter.  No orders of the defined types were placed in this encounter.    PMFS History: Patient Active Problem List   Diagnosis Date Noted  . AKI (acute kidney injury) ([place])   . Encephalopathy, toxic 05/11/2019  . Fatigue 04/06/2019  . Oropharyngeal candidiasis 03/25/2019  . Vulvar cysts 01/27/2019  . Urinary incontinence 05/13/2018  . OSA (obstructive sleep apnea) 06/12/2017  . Morbid obesity with BMI of 40.0-44.9, adult ([place]) 03/26/2017  . Toe amputation status, right 01/16/2017  . Thrombocytopenia ([place]) 06/05/2016  . 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 Type 2 diabetes with  nephropathy ([place]) 09/07/2015  . Uncontrolled diabetes mellitus with retinopathy, due to underlying condition, without macular edema ([place]) 09/05/2015  . Diabetic retinopathy ([place]) 09/05/2015  . Counseling regarding end of life decision making 06/14/2015  . Atherosclerosis of aorta ([place]) 04/04/2015  . Anemia 10/05/2014  . Chronic diastolic heart failure ([place])   . Tobacco abuse   . Severe obesity (BMI >= 40) ([place]) 03/02/2013  . Abnormality of gait 03/01/2013  . Healthcare maintenance 07/10/2012  . Chronic prescription opiate use 12/03/2011  . Secondary diabetes mellitus with peripheral vascular disease ([place]) 08/27/2011  . 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 ([place]) 11/29/2009  . Hypertension associated with diabetes ([place]) 11/29/2009  . Chronic insomnia 10/25/2009  . GASTROESOPHAGEAL REFLUX DISEASE 11/24/2008  . Depression, major, severe recurrence ([place]) 04/06/2008  . DM (diabetes mellitus) type II uncontrolled, periph vascular disorder ([place]) 04/02/2007  . Hyperlipidemia associated with type 2 diabetes mellitus ([place]) 01/08/2007  . COPD (chronic obstructive pulmonary disease) ([place]) 01/08/2007   Past Medical History:  Diagnosis Date  . Anginal pain [facility])    '3' of 10 ischemia ruled out 9/9   . Arthritis of lumbar spine   . Asthma   . Cataract   . CHF (congestive heart failure) ([place])   . 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 Chronic bronchitis ([place])    "I get it  alot" (09/28/2013)  . Chronic diastolic heart failure (HCC)    grade 2 per 2D echocardiogram (01/2013)  . Chronic lower back pain   . Chronic osteomyelitis of foot (HCC)    chronic, right secondary to diabetic foot ulcers  . Chronic pain syndrome 12/03/2011   Likely secondary to depression, "fibromyalgia", neuropathy, and obesity. Lumbar MRI 2014 no sig change from prior (2008) : Stable hypertrophic facet disease most notable at L4-5. Stable shallow left foraminal/extraforaminal disc protrusion at L4-5. No  direct neural compression.      Marland Kitchen COPD 01/08/2007   PFT's 05/2007 : FEV1/FVC 82, FEV1 64% pred, FEF 25-75% 40% predicted, 16% improvement in FEV1 with bronchodilators.     . Depression   . Diabetes mellitus without complication ([place])    Type II  . Diabetic peripheral neuropathy ([place])   . DVT of upper extremity (deep vein thrombosis) ([place]) 03/11/2013   Secondary to PICC line. Right brachial vein, diagnosed on 03/10/2013 Coumadin for 3 months. End date 06/10/2013   . Dyspnea    "smoker"  . Environmental allergies    Hx: of  . Exertional shortness of breath   . Fatty liver 2003   observed on ultrasound abdomen  . Fibromyalgia   . GERD (gastroesophageal reflux disease)   . Glaucoma   . History of use of hearing aid   . Hyperlipidemia   . Hyperplastic colon polyp 12/2010   Per colonoscopy (12/2010) - Dr. Deatra Ina  . Hypertension   . 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 Blood cultures 01/26/13 S. Aureus and GBS. Blood cultures Feb 6th, 8th, and 9th and March were negative.Repeat TEE 3/20 negative for vegitations  . 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 2/2 tobacco use, DM, and cont weight bearing on surgical wound and developed gangrene   . Pneumonia   . Polymicrobial bacterial infection 01/2013   GBS and S. aureus bacteremia // Source likely infected diabetic foot ulcer  . PVD (peripheral vascular disease) with claudication ([place])    Stents to bilateral common iliac arteries (left 2005, right 2008), on chronic plavix  . Rheumatoid arthritis ([place])   . S/P BKA (below knee amputation) unilateral [facility])    Oct 2014 L - failed limb preserving treatment. 2/2 tobacco use, DM, and cont weight bearing on surgical wound and developed gangrene   . Tobacco abuse   . Type II diabetes mellitus with peripheral circulatory disorders, uncontrolled DX: 1993   Insulin dep. Poor  control. Complicated by diabetic foot ulcer and diabetic eye disease.    Marland Kitchen Ulcer of foot, chronic (HCC)    Left. No OM per MRI (01/2013)    Family History  Problem Relation Age of Onset  . Diverticulosis Mother   . Diabetes Mother   . Hypertension Mother   . Congestive Heart Failure Mother   . Asthma Father   . CAD Sister 84       MI at age 26 per patient.  However, she has not had a stent or CABG.   . Heart disease Sister        before age 81  . Breast cancer Neg Hx     Past Surgical History:  Procedure Laterality Date  . ABDOMINAL HYSTERECTOMY  1997   secondary to uterine fibroids  . AMPUTATION Left 08/31/2013   Procedure: AMPUTATION RAY;  Surgeon: Newt Minion, MD;  Location: [facility]  OR;  Service: Orthopedics;  Laterality: Left;  Left Foot 5th Ray Amputation  . AMPUTATION Left 09/28/2013   Procedure: Left Midfoot amputation;  Surgeon: Newt Minion, MD;  Location: [place];  Service: Orthopedics;  Laterality: Left;  Left Midfoot amputation  . AMPUTATION Left 10/14/2013   Procedure: AMPUTATION BELOW KNEE- left;  Surgeon: Newt Minion, MD;  Location: [place];  Service: Orthopedics;  Laterality: Left;  Left Below Knee Amputation   . AMPUTATION TOE Right 01/15/2017   Procedure: AMPUTATION 5th TOE RIGHT FOOT;  Surgeon: Edrick Kins, DPM;  Location: [place];  Service: Podiatry;  Laterality: Right;  . APPLICATION OF WOUND VAC  04/01/2019   Procedure: Application Of Wound Vac;  Surgeon: Newt Minion, MD;  Location: [place];  Service: Orthopedics;;  . BLADDER SURGERY     bladder reconstruction surgery  . BREAST BIOPSY     multiple-benign per pt  . COLONOSCOPY    . ESOPHAGOGASTRODUODENOSCOPY N/A 09/20/2013   Procedure: ESOPHAGOGASTRODUODENOSCOPY (EGD);  Surgeon: Jerene Bears, MD;  Location: [place];  Service: Gastroenterology;  Laterality: N/A;  . FOOT AMPUTATION THROUGH METATARSAL Left 09/28/2013  . GANGLION CYST EXCISION     multiple  . PERIPHERAL VASCULAR INTERVENTION     stents in lower  ext  . SHOULDER ARTHROSCOPY W/ ROTATOR CUFF REPAIR Bilateral   . SKIN SPLIT GRAFT Bilateral 05/13/2013   Procedure: Right and Left Foot Allograft Skin Graft;  Surgeon: Newt Minion, MD;  Location: [place];  Service: Orthopedics;  Laterality: Bilateral;  Right and Left Foot Allograft Skin Graft  . STUMP REVISION Left 04/01/2019   Procedure: REVISION LEFT BELOW KNEE AMPUTATION;  Surgeon: Newt Minion, MD;  Location: [facility];  Service: Orthopedics;  Laterality: Left;  . TEE WITHOUT CARDIOVERSION N/A 01/31/2013   Procedure: TRANSESOPHAGEAL ECHOCARDIOGRAM (TEE);  Surgeon: Fay Records, MD;  Location: [place];  Service: Cardiovascular;  Laterality: N/A;  Rm [phone]  . TEE WITHOUT CARDIOVERSION N/A 03/10/2013   Procedure: TRANSESOPHAGEAL ECHOCARDIOGRAM (TEE);  Surgeon: Larey Dresser, MD;  Location: [place];  Service: Cardiovascular;  Laterality: N/A;  Rm. [phone]  . TOE AMPUTATION Left 08/31/2013   4TH & 5 TH TOE   . TONSILLECTOMY    . TUBAL LIGATION    . WRIST SURGERY Right    "for tumors" (09/28/2013)   Social History   Occupational History  . Occupation: Disability    Comment: previously worked as a Engineer, materials  . Smoking status: Current Every Day Smoker    Packs/day: 1.00    Years: 50.00    Pack years: 50.00    Types: Cigarettes  . Smokeless tobacco: Never Used  Substance and Sexual Activity  . Alcohol use: No    Alcohol/week: 0.0 standard drinks  . Drug use: No    Types: Marijuana, "Crack" cocaine    Comment: 09/28/2013 "no marijuana since 2011, no crack/cocaine 1989"  . Sexual activity: Not Currently

## 2019-05-19 ENCOUNTER — Encounter: Payer: Self-pay | Admitting: Internal Medicine

## 2019-05-19 MED ORDER — INSULIN DEGLUDEC 200 UNIT/ML ~~LOC~~ SOPN: 70.0000 [IU] | PEN_INJECTOR | Freq: Every day | SUBCUTANEOUS | 2 refills | Status: DC

## 2019-05-19 NOTE — Assessment & Plan Note (Signed)
Patient was recently hospitalized 5/20-5/23 for toxic encephalopathy. Workup was overall unremarkable except for asymptomatic bacteriuria with enterococcus faecalis. Her encephalopathy was thought to be related to multiple centrally acting medications and she was found to have improvement in mental status with holding of these medications. She was asked to stop taking baclofen, Hydroxyzine, pregabalin, toviaz, and trazodone at the time of discharge. She was prescribed home health nursing and physical therapy and presents today for follow up.  She reports that she has continued to take lyrica and Lisbeth Ply but has otherwise followed the recommendations.

## 2019-05-19 NOTE — Progress Notes (Signed)
Internal Medicine Clinic Attending  Case discussed with Dr. Blum at the time of the visit.  We reviewed the resident's history and exam and pertinent patient test results.  I agree with the assessment, diagnosis, and plan of care documented in the resident's note. 

## 2019-05-19 NOTE — Assessment & Plan Note (Signed)
Patient was noted to have prerenal acute kidney injury which improved to baseline with IVF. She was found to be normotensive despite holding home benazepril and HCTZ. At the time of discharge she was asked to stop taking HCTZ. Today her blood pressure remains well controlled off of the HCTZ.

## 2019-05-19 NOTE — Assessment & Plan Note (Signed)
Last hemoglobin A1c 9.3 when checked three months ago. Currently she is prescribed tresiba 30 units daily and metformin 500 mg daily.  She explains that prior to being hospitalized she was taking 120 units of insulin in the morning and 85 at night. She found that 30 units was not working to control her blood glucose so she went back to taking her original dose.  Hemoglobin A1c is 9.8 today, review of prior notes is not consistent with the dosing of tresiba she describes, instead I can see that she was prescribed 80 units daily. It seems that while she was in the hospital for encephalopathy and not eating as much her blood glucose was controlled with basal 12 units daily.  - will change tresiba recommended dose to 70 units daily

## 2019-05-21 ENCOUNTER — Telehealth: Payer: Self-pay

## 2019-05-21 NOTE — Telephone Encounter (Signed)
Left vm for patient to call back re:"additional testing" for potential covid exposure. Unable to assess for covid s/sx.

## 2019-05-23 DIAGNOSIS — E1151 Type 2 diabetes mellitus with diabetic peripheral angiopathy without gangrene: Secondary | ICD-10-CM | POA: Diagnosis not present

## 2019-05-23 DIAGNOSIS — G4733 Obstructive sleep apnea (adult) (pediatric): Secondary | ICD-10-CM | POA: Diagnosis not present

## 2019-05-23 DIAGNOSIS — M4696 Unspecified inflammatory spondylopathy, lumbar region: Secondary | ICD-10-CM | POA: Diagnosis not present

## 2019-05-23 DIAGNOSIS — G894 Chronic pain syndrome: Secondary | ICD-10-CM | POA: Diagnosis not present

## 2019-05-23 DIAGNOSIS — J449 Chronic obstructive pulmonary disease, unspecified: Secondary | ICD-10-CM | POA: Diagnosis not present

## 2019-05-23 DIAGNOSIS — E1165 Type 2 diabetes mellitus with hyperglycemia: Secondary | ICD-10-CM | POA: Diagnosis not present

## 2019-05-23 DIAGNOSIS — Z89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): Secondary | ICD-10-CM | POA: Diagnosis not present

## 2019-05-23 DIAGNOSIS — E1121 Type 2 diabetes mellitus with diabetic nephropathy: Secondary | ICD-10-CM | POA: Diagnosis not present

## 2019-05-23 DIAGNOSIS — T8781 Dehiscence of amputation stump: Secondary | ICD-10-CM | POA: Diagnosis not present

## 2019-05-23 DIAGNOSIS — I5032 Chronic diastolic (congestive) heart failure: Secondary | ICD-10-CM | POA: Diagnosis not present

## 2019-05-23 DIAGNOSIS — E11319 Type 2 diabetes mellitus with unspecified diabetic retinopathy without macular edema: Secondary | ICD-10-CM | POA: Diagnosis not present

## 2019-05-23 DIAGNOSIS — Z89512 Acquired absence of left leg below knee: Secondary | ICD-10-CM | POA: Diagnosis not present

## 2019-05-23 DIAGNOSIS — E1139 Type 2 diabetes mellitus with other diabetic ophthalmic complication: Secondary | ICD-10-CM | POA: Diagnosis not present

## 2019-05-23 DIAGNOSIS — M797 Fibromyalgia: Secondary | ICD-10-CM | POA: Diagnosis not present

## 2019-05-23 DIAGNOSIS — T879 Unspecified complications of amputation stump: Secondary | ICD-10-CM | POA: Diagnosis not present

## 2019-05-23 DIAGNOSIS — I700 Atherosclerosis of aorta: Secondary | ICD-10-CM | POA: Diagnosis not present

## 2019-05-23 DIAGNOSIS — I152 Hypertension secondary to endocrine disorders: Secondary | ICD-10-CM | POA: Diagnosis not present

## 2019-05-23 DIAGNOSIS — E1140 Type 2 diabetes mellitus with diabetic neuropathy, unspecified: Secondary | ICD-10-CM | POA: Diagnosis not present

## 2019-05-23 DIAGNOSIS — Z794 Long term (current) use of insulin: Secondary | ICD-10-CM | POA: Diagnosis not present

## 2019-05-23 DIAGNOSIS — Z9181 History of falling: Secondary | ICD-10-CM | POA: Diagnosis not present

## 2019-05-23 DIAGNOSIS — H42 Glaucoma in diseases classified elsewhere: Secondary | ICD-10-CM | POA: Diagnosis not present

## 2019-05-23 NOTE — Telephone Encounter (Signed)
Called pharmacy this am, recording states they closed at 0900 this am, will try later

## 2019-05-24 ENCOUNTER — Telehealth: Payer: Self-pay | Admitting: *Deleted

## 2019-05-24 ENCOUNTER — Telehealth: Payer: Self-pay

## 2019-05-24 DIAGNOSIS — I152 Hypertension secondary to endocrine disorders: Secondary | ICD-10-CM | POA: Diagnosis not present

## 2019-05-24 DIAGNOSIS — I5032 Chronic diastolic (congestive) heart failure: Secondary | ICD-10-CM | POA: Diagnosis not present

## 2019-05-24 DIAGNOSIS — E1140 Type 2 diabetes mellitus with diabetic neuropathy, unspecified: Secondary | ICD-10-CM | POA: Diagnosis not present

## 2019-05-24 DIAGNOSIS — T8781 Dehiscence of amputation stump: Secondary | ICD-10-CM | POA: Diagnosis not present

## 2019-05-24 DIAGNOSIS — Z794 Long term (current) use of insulin: Secondary | ICD-10-CM | POA: Diagnosis not present

## 2019-05-24 DIAGNOSIS — Z89512 Acquired absence of left leg below knee: Secondary | ICD-10-CM | POA: Diagnosis not present

## 2019-05-24 DIAGNOSIS — E1121 Type 2 diabetes mellitus with diabetic nephropathy: Secondary | ICD-10-CM | POA: Diagnosis not present

## 2019-05-24 DIAGNOSIS — Z9181 History of falling: Secondary | ICD-10-CM | POA: Diagnosis not present

## 2019-05-24 DIAGNOSIS — J449 Chronic obstructive pulmonary disease, unspecified: Secondary | ICD-10-CM | POA: Diagnosis not present

## 2019-05-24 DIAGNOSIS — E1165 Type 2 diabetes mellitus with hyperglycemia: Secondary | ICD-10-CM | POA: Diagnosis not present

## 2019-05-24 DIAGNOSIS — G4733 Obstructive sleep apnea (adult) (pediatric): Secondary | ICD-10-CM | POA: Diagnosis not present

## 2019-05-24 DIAGNOSIS — E1151 Type 2 diabetes mellitus with diabetic peripheral angiopathy without gangrene: Secondary | ICD-10-CM | POA: Diagnosis not present

## 2019-05-24 DIAGNOSIS — T879 Unspecified complications of amputation stump: Secondary | ICD-10-CM | POA: Diagnosis not present

## 2019-05-24 DIAGNOSIS — M797 Fibromyalgia: Secondary | ICD-10-CM | POA: Diagnosis not present

## 2019-05-24 DIAGNOSIS — I700 Atherosclerosis of aorta: Secondary | ICD-10-CM | POA: Diagnosis not present

## 2019-05-24 DIAGNOSIS — G894 Chronic pain syndrome: Secondary | ICD-10-CM | POA: Diagnosis not present

## 2019-05-24 DIAGNOSIS — M4696 Unspecified inflammatory spondylopathy, lumbar region: Secondary | ICD-10-CM | POA: Diagnosis not present

## 2019-05-24 DIAGNOSIS — E11319 Type 2 diabetes mellitus with unspecified diabetic retinopathy without macular edema: Secondary | ICD-10-CM | POA: Diagnosis not present

## 2019-05-24 DIAGNOSIS — H42 Glaucoma in diseases classified elsewhere: Secondary | ICD-10-CM | POA: Diagnosis not present

## 2019-05-24 DIAGNOSIS — E1139 Type 2 diabetes mellitus with other diabetic ophthalmic complication: Secondary | ICD-10-CM | POA: Diagnosis not present

## 2019-05-24 DIAGNOSIS — Z89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): Secondary | ICD-10-CM | POA: Diagnosis not present

## 2019-05-24 NOTE — Progress Notes (Signed)
Internal Medicine Clinic Attending  Case discussed with Dr. Blum at the time of the visit.  We reviewed the resident's history and exam and pertinent patient test results.  I agree with the assessment, diagnosis, and plan of care documented in the resident's note. 

## 2019-05-24 NOTE — Telephone Encounter (Signed)
HHN calls and reports when she arrived BP 182/96, had not taken any meds today, was having breakfast as they visited. Pt denied s/s of elevated BP. Stated she feels just fine. It was not checked a second time during visit. HHN educated pt on taking meds at appr same time daily and to always take meds. She spoke to pt about seeking ems if needed and to call office if needed. This is Pharmacist, hospital

## 2019-05-24 NOTE — Telephone Encounter (Signed)
Called and lm on vm for verbal ok for extension on visits for [facility]

## 2019-05-24 NOTE — Telephone Encounter (Signed)
agree.  Thanks.

## 2019-05-25 ENCOUNTER — Telehealth: Payer: Self-pay | Admitting: Orthopedic Surgery

## 2019-05-25 DIAGNOSIS — I5032 Chronic diastolic (congestive) heart failure: Secondary | ICD-10-CM | POA: Diagnosis not present

## 2019-05-25 DIAGNOSIS — Z89512 Acquired absence of left leg below knee: Secondary | ICD-10-CM | POA: Diagnosis not present

## 2019-05-25 DIAGNOSIS — L03119 Cellulitis of unspecified part of limb: Secondary | ICD-10-CM | POA: Diagnosis not present

## 2019-05-25 DIAGNOSIS — G894 Chronic pain syndrome: Secondary | ICD-10-CM | POA: Diagnosis not present

## 2019-05-25 DIAGNOSIS — J449 Chronic obstructive pulmonary disease, unspecified: Secondary | ICD-10-CM | POA: Diagnosis not present

## 2019-05-25 DIAGNOSIS — E1139 Type 2 diabetes mellitus with other diabetic ophthalmic complication: Secondary | ICD-10-CM | POA: Diagnosis not present

## 2019-05-25 DIAGNOSIS — T8781 Dehiscence of amputation stump: Secondary | ICD-10-CM | POA: Diagnosis not present

## 2019-05-25 DIAGNOSIS — E1151 Type 2 diabetes mellitus with diabetic peripheral angiopathy without gangrene: Secondary | ICD-10-CM | POA: Diagnosis not present

## 2019-05-25 DIAGNOSIS — G4733 Obstructive sleep apnea (adult) (pediatric): Secondary | ICD-10-CM | POA: Diagnosis not present

## 2019-05-25 DIAGNOSIS — Z794 Long term (current) use of insulin: Secondary | ICD-10-CM | POA: Diagnosis not present

## 2019-05-25 DIAGNOSIS — Z9181 History of falling: Secondary | ICD-10-CM | POA: Diagnosis not present

## 2019-05-25 DIAGNOSIS — M797 Fibromyalgia: Secondary | ICD-10-CM | POA: Diagnosis not present

## 2019-05-25 DIAGNOSIS — H42 Glaucoma in diseases classified elsewhere: Secondary | ICD-10-CM | POA: Diagnosis not present

## 2019-05-25 DIAGNOSIS — E1165 Type 2 diabetes mellitus with hyperglycemia: Secondary | ICD-10-CM | POA: Diagnosis not present

## 2019-05-25 DIAGNOSIS — B951 Streptococcus, group B, as the cause of diseases classified elsewhere: Secondary | ICD-10-CM | POA: Diagnosis not present

## 2019-05-25 DIAGNOSIS — T879 Unspecified complications of amputation stump: Secondary | ICD-10-CM | POA: Diagnosis not present

## 2019-05-25 DIAGNOSIS — E11319 Type 2 diabetes mellitus with unspecified diabetic retinopathy without macular edema: Secondary | ICD-10-CM | POA: Diagnosis not present

## 2019-05-25 DIAGNOSIS — I700 Atherosclerosis of aorta: Secondary | ICD-10-CM | POA: Diagnosis not present

## 2019-05-25 DIAGNOSIS — M4696 Unspecified inflammatory spondylopathy, lumbar region: Secondary | ICD-10-CM | POA: Diagnosis not present

## 2019-05-25 DIAGNOSIS — E1140 Type 2 diabetes mellitus with diabetic neuropathy, unspecified: Secondary | ICD-10-CM | POA: Diagnosis not present

## 2019-05-25 DIAGNOSIS — Z89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): Secondary | ICD-10-CM | POA: Diagnosis not present

## 2019-05-25 DIAGNOSIS — E1121 Type 2 diabetes mellitus with diabetic nephropathy: Secondary | ICD-10-CM | POA: Diagnosis not present

## 2019-05-25 DIAGNOSIS — I152 Hypertension secondary to endocrine disorders: Secondary | ICD-10-CM | POA: Diagnosis not present

## 2019-05-25 NOTE — Telephone Encounter (Signed)
[facility] was called and given VO for patient.

## 2019-05-25 NOTE — Telephone Encounter (Signed)
[facility]/PT called for verbal orders 1w 1 2w 3 1w 3  Stated if you get vm it is restricted and you may leave a vm. [phone]

## 2019-05-26 DIAGNOSIS — T879 Unspecified complications of amputation stump: Secondary | ICD-10-CM | POA: Diagnosis not present

## 2019-05-26 DIAGNOSIS — G4733 Obstructive sleep apnea (adult) (pediatric): Secondary | ICD-10-CM | POA: Diagnosis not present

## 2019-05-26 DIAGNOSIS — J449 Chronic obstructive pulmonary disease, unspecified: Secondary | ICD-10-CM | POA: Diagnosis not present

## 2019-05-26 DIAGNOSIS — Z9181 History of falling: Secondary | ICD-10-CM | POA: Diagnosis not present

## 2019-05-26 DIAGNOSIS — Z89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): Secondary | ICD-10-CM | POA: Diagnosis not present

## 2019-05-26 DIAGNOSIS — T8781 Dehiscence of amputation stump: Secondary | ICD-10-CM | POA: Diagnosis not present

## 2019-05-26 DIAGNOSIS — E1139 Type 2 diabetes mellitus with other diabetic ophthalmic complication: Secondary | ICD-10-CM | POA: Diagnosis not present

## 2019-05-26 DIAGNOSIS — I5032 Chronic diastolic (congestive) heart failure: Secondary | ICD-10-CM | POA: Diagnosis not present

## 2019-05-26 DIAGNOSIS — H42 Glaucoma in diseases classified elsewhere: Secondary | ICD-10-CM | POA: Diagnosis not present

## 2019-05-26 DIAGNOSIS — M4696 Unspecified inflammatory spondylopathy, lumbar region: Secondary | ICD-10-CM | POA: Diagnosis not present

## 2019-05-26 DIAGNOSIS — E1151 Type 2 diabetes mellitus with diabetic peripheral angiopathy without gangrene: Secondary | ICD-10-CM | POA: Diagnosis not present

## 2019-05-26 DIAGNOSIS — M797 Fibromyalgia: Secondary | ICD-10-CM | POA: Diagnosis not present

## 2019-05-26 DIAGNOSIS — E1121 Type 2 diabetes mellitus with diabetic nephropathy: Secondary | ICD-10-CM | POA: Diagnosis not present

## 2019-05-26 DIAGNOSIS — E11319 Type 2 diabetes mellitus with unspecified diabetic retinopathy without macular edema: Secondary | ICD-10-CM | POA: Diagnosis not present

## 2019-05-26 DIAGNOSIS — E1140 Type 2 diabetes mellitus with diabetic neuropathy, unspecified: Secondary | ICD-10-CM | POA: Diagnosis not present

## 2019-05-26 DIAGNOSIS — I152 Hypertension secondary to endocrine disorders: Secondary | ICD-10-CM | POA: Diagnosis not present

## 2019-05-26 DIAGNOSIS — Z794 Long term (current) use of insulin: Secondary | ICD-10-CM | POA: Diagnosis not present

## 2019-05-26 DIAGNOSIS — I700 Atherosclerosis of aorta: Secondary | ICD-10-CM | POA: Diagnosis not present

## 2019-05-26 DIAGNOSIS — G894 Chronic pain syndrome: Secondary | ICD-10-CM | POA: Diagnosis not present

## 2019-05-26 DIAGNOSIS — Z89512 Acquired absence of left leg below knee: Secondary | ICD-10-CM | POA: Diagnosis not present

## 2019-05-26 DIAGNOSIS — E1165 Type 2 diabetes mellitus with hyperglycemia: Secondary | ICD-10-CM | POA: Diagnosis not present

## 2019-05-27 ENCOUNTER — Telehealth: Payer: Self-pay | Admitting: Orthopedic Surgery

## 2019-05-27 NOTE — Telephone Encounter (Signed)
Called and sw Meredith and gave verbal ok for orders below. Pt s/p BKA revision

## 2019-05-27 NOTE — Telephone Encounter (Signed)
FYI: [facility]/PT called to change because pt refused PT today and they will start next week.  [phone] can leave a message on VM.

## 2019-05-27 NOTE — Telephone Encounter (Signed)
Noted  

## 2019-05-27 NOTE — Telephone Encounter (Signed)
Jennifer [name] called for verbal order for [facility] OT  1week 1 2week 2  Plewase call Meredith back @[phone]

## 2019-05-30 DIAGNOSIS — J449 Chronic obstructive pulmonary disease, unspecified: Secondary | ICD-10-CM | POA: Diagnosis not present

## 2019-05-30 DIAGNOSIS — E1165 Type 2 diabetes mellitus with hyperglycemia: Secondary | ICD-10-CM | POA: Diagnosis not present

## 2019-05-30 DIAGNOSIS — Z794 Long term (current) use of insulin: Secondary | ICD-10-CM | POA: Diagnosis not present

## 2019-05-30 DIAGNOSIS — Z9181 History of falling: Secondary | ICD-10-CM | POA: Diagnosis not present

## 2019-05-30 DIAGNOSIS — E1121 Type 2 diabetes mellitus with diabetic nephropathy: Secondary | ICD-10-CM | POA: Diagnosis not present

## 2019-05-30 DIAGNOSIS — M4696 Unspecified inflammatory spondylopathy, lumbar region: Secondary | ICD-10-CM | POA: Diagnosis not present

## 2019-05-30 DIAGNOSIS — G4733 Obstructive sleep apnea (adult) (pediatric): Secondary | ICD-10-CM | POA: Diagnosis not present

## 2019-05-30 DIAGNOSIS — E1140 Type 2 diabetes mellitus with diabetic neuropathy, unspecified: Secondary | ICD-10-CM | POA: Diagnosis not present

## 2019-05-30 DIAGNOSIS — Z89512 Acquired absence of left leg below knee: Secondary | ICD-10-CM | POA: Diagnosis not present

## 2019-05-30 DIAGNOSIS — I700 Atherosclerosis of aorta: Secondary | ICD-10-CM | POA: Diagnosis not present

## 2019-05-30 DIAGNOSIS — G894 Chronic pain syndrome: Secondary | ICD-10-CM | POA: Diagnosis not present

## 2019-05-30 DIAGNOSIS — T8781 Dehiscence of amputation stump: Secondary | ICD-10-CM | POA: Diagnosis not present

## 2019-05-30 DIAGNOSIS — E11319 Type 2 diabetes mellitus with unspecified diabetic retinopathy without macular edema: Secondary | ICD-10-CM | POA: Diagnosis not present

## 2019-05-30 DIAGNOSIS — I5032 Chronic diastolic (congestive) heart failure: Secondary | ICD-10-CM | POA: Diagnosis not present

## 2019-05-30 DIAGNOSIS — E1139 Type 2 diabetes mellitus with other diabetic ophthalmic complication: Secondary | ICD-10-CM | POA: Diagnosis not present

## 2019-05-30 DIAGNOSIS — Z89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): Secondary | ICD-10-CM | POA: Diagnosis not present

## 2019-05-30 DIAGNOSIS — E1151 Type 2 diabetes mellitus with diabetic peripheral angiopathy without gangrene: Secondary | ICD-10-CM | POA: Diagnosis not present

## 2019-05-30 DIAGNOSIS — H42 Glaucoma in diseases classified elsewhere: Secondary | ICD-10-CM | POA: Diagnosis not present

## 2019-05-30 DIAGNOSIS — I152 Hypertension secondary to endocrine disorders: Secondary | ICD-10-CM | POA: Diagnosis not present

## 2019-05-30 DIAGNOSIS — T879 Unspecified complications of amputation stump: Secondary | ICD-10-CM | POA: Diagnosis not present

## 2019-05-30 DIAGNOSIS — M797 Fibromyalgia: Secondary | ICD-10-CM | POA: Diagnosis not present

## 2019-05-31 ENCOUNTER — Telehealth: Payer: Self-pay | Admitting: Orthopedic Surgery

## 2019-05-31 DIAGNOSIS — Z89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): Secondary | ICD-10-CM | POA: Diagnosis not present

## 2019-05-31 DIAGNOSIS — I700 Atherosclerosis of aorta: Secondary | ICD-10-CM | POA: Diagnosis not present

## 2019-05-31 DIAGNOSIS — Z89512 Acquired absence of left leg below knee: Secondary | ICD-10-CM | POA: Diagnosis not present

## 2019-05-31 DIAGNOSIS — E1165 Type 2 diabetes mellitus with hyperglycemia: Secondary | ICD-10-CM | POA: Diagnosis not present

## 2019-05-31 DIAGNOSIS — M4696 Unspecified inflammatory spondylopathy, lumbar region: Secondary | ICD-10-CM | POA: Diagnosis not present

## 2019-05-31 DIAGNOSIS — M797 Fibromyalgia: Secondary | ICD-10-CM | POA: Diagnosis not present

## 2019-05-31 DIAGNOSIS — I5032 Chronic diastolic (congestive) heart failure: Secondary | ICD-10-CM | POA: Diagnosis not present

## 2019-05-31 DIAGNOSIS — E1151 Type 2 diabetes mellitus with diabetic peripheral angiopathy without gangrene: Secondary | ICD-10-CM | POA: Diagnosis not present

## 2019-05-31 DIAGNOSIS — E1139 Type 2 diabetes mellitus with other diabetic ophthalmic complication: Secondary | ICD-10-CM | POA: Diagnosis not present

## 2019-05-31 DIAGNOSIS — G4733 Obstructive sleep apnea (adult) (pediatric): Secondary | ICD-10-CM | POA: Diagnosis not present

## 2019-05-31 DIAGNOSIS — Z9181 History of falling: Secondary | ICD-10-CM | POA: Diagnosis not present

## 2019-05-31 DIAGNOSIS — T8781 Dehiscence of amputation stump: Secondary | ICD-10-CM | POA: Diagnosis not present

## 2019-05-31 DIAGNOSIS — J449 Chronic obstructive pulmonary disease, unspecified: Secondary | ICD-10-CM | POA: Diagnosis not present

## 2019-05-31 DIAGNOSIS — E11319 Type 2 diabetes mellitus with unspecified diabetic retinopathy without macular edema: Secondary | ICD-10-CM | POA: Diagnosis not present

## 2019-05-31 DIAGNOSIS — E1121 Type 2 diabetes mellitus with diabetic nephropathy: Secondary | ICD-10-CM | POA: Diagnosis not present

## 2019-05-31 DIAGNOSIS — H42 Glaucoma in diseases classified elsewhere: Secondary | ICD-10-CM | POA: Diagnosis not present

## 2019-05-31 DIAGNOSIS — T879 Unspecified complications of amputation stump: Secondary | ICD-10-CM | POA: Diagnosis not present

## 2019-05-31 DIAGNOSIS — G894 Chronic pain syndrome: Secondary | ICD-10-CM | POA: Diagnosis not present

## 2019-05-31 DIAGNOSIS — E1140 Type 2 diabetes mellitus with diabetic neuropathy, unspecified: Secondary | ICD-10-CM | POA: Diagnosis not present

## 2019-05-31 DIAGNOSIS — I152 Hypertension secondary to endocrine disorders: Secondary | ICD-10-CM | POA: Diagnosis not present

## 2019-05-31 DIAGNOSIS — Z794 Long term (current) use of insulin: Secondary | ICD-10-CM | POA: Diagnosis not present

## 2019-05-31 NOTE — Telephone Encounter (Signed)
05/04/2019 ov note faxed to [facility] for face to face [phone]

## 2019-06-01 ENCOUNTER — Telehealth: Payer: Self-pay

## 2019-06-01 ENCOUNTER — Ambulatory Visit: Payer: Medicare Other | Admitting: Family

## 2019-06-01 DIAGNOSIS — E1140 Type 2 diabetes mellitus with diabetic neuropathy, unspecified: Secondary | ICD-10-CM | POA: Diagnosis not present

## 2019-06-01 DIAGNOSIS — G894 Chronic pain syndrome: Secondary | ICD-10-CM | POA: Diagnosis not present

## 2019-06-01 DIAGNOSIS — H42 Glaucoma in diseases classified elsewhere: Secondary | ICD-10-CM | POA: Diagnosis not present

## 2019-06-01 DIAGNOSIS — Z89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): Secondary | ICD-10-CM | POA: Diagnosis not present

## 2019-06-01 DIAGNOSIS — J449 Chronic obstructive pulmonary disease, unspecified: Secondary | ICD-10-CM | POA: Diagnosis not present

## 2019-06-01 DIAGNOSIS — T8781 Dehiscence of amputation stump: Secondary | ICD-10-CM | POA: Diagnosis not present

## 2019-06-01 DIAGNOSIS — Z794 Long term (current) use of insulin: Secondary | ICD-10-CM | POA: Diagnosis not present

## 2019-06-01 DIAGNOSIS — E1121 Type 2 diabetes mellitus with diabetic nephropathy: Secondary | ICD-10-CM | POA: Diagnosis not present

## 2019-06-01 DIAGNOSIS — I5032 Chronic diastolic (congestive) heart failure: Secondary | ICD-10-CM | POA: Diagnosis not present

## 2019-06-01 DIAGNOSIS — I700 Atherosclerosis of aorta: Secondary | ICD-10-CM | POA: Diagnosis not present

## 2019-06-01 DIAGNOSIS — E1165 Type 2 diabetes mellitus with hyperglycemia: Secondary | ICD-10-CM | POA: Diagnosis not present

## 2019-06-01 DIAGNOSIS — Z9181 History of falling: Secondary | ICD-10-CM | POA: Diagnosis not present

## 2019-06-01 DIAGNOSIS — E1139 Type 2 diabetes mellitus with other diabetic ophthalmic complication: Secondary | ICD-10-CM | POA: Diagnosis not present

## 2019-06-01 DIAGNOSIS — T879 Unspecified complications of amputation stump: Secondary | ICD-10-CM | POA: Diagnosis not present

## 2019-06-01 DIAGNOSIS — I152 Hypertension secondary to endocrine disorders: Secondary | ICD-10-CM | POA: Diagnosis not present

## 2019-06-01 DIAGNOSIS — Z89512 Acquired absence of left leg below knee: Secondary | ICD-10-CM | POA: Diagnosis not present

## 2019-06-01 DIAGNOSIS — G4733 Obstructive sleep apnea (adult) (pediatric): Secondary | ICD-10-CM | POA: Diagnosis not present

## 2019-06-01 DIAGNOSIS — E1151 Type 2 diabetes mellitus with diabetic peripheral angiopathy without gangrene: Secondary | ICD-10-CM | POA: Diagnosis not present

## 2019-06-01 DIAGNOSIS — E11319 Type 2 diabetes mellitus with unspecified diabetic retinopathy without macular edema: Secondary | ICD-10-CM | POA: Diagnosis not present

## 2019-06-01 DIAGNOSIS — M797 Fibromyalgia: Secondary | ICD-10-CM | POA: Diagnosis not present

## 2019-06-01 DIAGNOSIS — M4696 Unspecified inflammatory spondylopathy, lumbar region: Secondary | ICD-10-CM | POA: Diagnosis not present

## 2019-06-01 NOTE — Telephone Encounter (Signed)
Caryl Pina from [facility] called and lvm concerning patient's new wound on her foot and I left vm for her stating that patient had an appt today but she no show and we will need to see her in the office and I will call her and get her scheduled in. Patient recently had surgery and we will like to see her in office I she has a new wound. Thank you.

## 2019-06-03 DIAGNOSIS — E1121 Type 2 diabetes mellitus with diabetic nephropathy: Secondary | ICD-10-CM | POA: Diagnosis not present

## 2019-06-03 DIAGNOSIS — E1139 Type 2 diabetes mellitus with other diabetic ophthalmic complication: Secondary | ICD-10-CM | POA: Diagnosis not present

## 2019-06-03 DIAGNOSIS — I5032 Chronic diastolic (congestive) heart failure: Secondary | ICD-10-CM | POA: Diagnosis not present

## 2019-06-03 DIAGNOSIS — Z9181 History of falling: Secondary | ICD-10-CM | POA: Diagnosis not present

## 2019-06-03 DIAGNOSIS — E1140 Type 2 diabetes mellitus with diabetic neuropathy, unspecified: Secondary | ICD-10-CM | POA: Diagnosis not present

## 2019-06-03 DIAGNOSIS — G894 Chronic pain syndrome: Secondary | ICD-10-CM | POA: Diagnosis not present

## 2019-06-03 DIAGNOSIS — I700 Atherosclerosis of aorta: Secondary | ICD-10-CM | POA: Diagnosis not present

## 2019-06-03 DIAGNOSIS — I152 Hypertension secondary to endocrine disorders: Secondary | ICD-10-CM | POA: Diagnosis not present

## 2019-06-03 DIAGNOSIS — Z89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): Secondary | ICD-10-CM | POA: Diagnosis not present

## 2019-06-03 DIAGNOSIS — E1165 Type 2 diabetes mellitus with hyperglycemia: Secondary | ICD-10-CM | POA: Diagnosis not present

## 2019-06-03 DIAGNOSIS — G4733 Obstructive sleep apnea (adult) (pediatric): Secondary | ICD-10-CM | POA: Diagnosis not present

## 2019-06-03 DIAGNOSIS — Z89512 Acquired absence of left leg below knee: Secondary | ICD-10-CM | POA: Diagnosis not present

## 2019-06-03 DIAGNOSIS — E11319 Type 2 diabetes mellitus with unspecified diabetic retinopathy without macular edema: Secondary | ICD-10-CM | POA: Diagnosis not present

## 2019-06-03 DIAGNOSIS — M797 Fibromyalgia: Secondary | ICD-10-CM | POA: Diagnosis not present

## 2019-06-03 DIAGNOSIS — M4696 Unspecified inflammatory spondylopathy, lumbar region: Secondary | ICD-10-CM | POA: Diagnosis not present

## 2019-06-03 DIAGNOSIS — Z794 Long term (current) use of insulin: Secondary | ICD-10-CM | POA: Diagnosis not present

## 2019-06-03 DIAGNOSIS — H42 Glaucoma in diseases classified elsewhere: Secondary | ICD-10-CM | POA: Diagnosis not present

## 2019-06-03 DIAGNOSIS — E1151 Type 2 diabetes mellitus with diabetic peripheral angiopathy without gangrene: Secondary | ICD-10-CM | POA: Diagnosis not present

## 2019-06-03 DIAGNOSIS — T8781 Dehiscence of amputation stump: Secondary | ICD-10-CM | POA: Diagnosis not present

## 2019-06-03 DIAGNOSIS — J449 Chronic obstructive pulmonary disease, unspecified: Secondary | ICD-10-CM | POA: Diagnosis not present

## 2019-06-03 DIAGNOSIS — T879 Unspecified complications of amputation stump: Secondary | ICD-10-CM | POA: Diagnosis not present

## 2019-06-06 ENCOUNTER — Other Ambulatory Visit: Payer: Self-pay

## 2019-06-06 ENCOUNTER — Telehealth: Payer: Self-pay | Admitting: Physician Assistant

## 2019-06-06 ENCOUNTER — Encounter: Payer: Self-pay | Admitting: Physician Assistant

## 2019-06-06 ENCOUNTER — Ambulatory Visit (INDEPENDENT_AMBULATORY_CARE_PROVIDER_SITE_OTHER): Payer: Medicare Other | Admitting: Physician Assistant

## 2019-06-06 VITALS — Ht 67.0 in | Wt 243.8 lb

## 2019-06-06 DIAGNOSIS — E440 Moderate protein-calorie malnutrition: Secondary | ICD-10-CM

## 2019-06-06 DIAGNOSIS — E1142 Type 2 diabetes mellitus with diabetic polyneuropathy: Secondary | ICD-10-CM

## 2019-06-06 DIAGNOSIS — Z794 Long term (current) use of insulin: Secondary | ICD-10-CM

## 2019-06-06 DIAGNOSIS — Z89512 Acquired absence of left leg below knee: Secondary | ICD-10-CM

## 2019-06-06 DIAGNOSIS — L97511 Non-pressure chronic ulcer of other part of right foot limited to breakdown of skin: Secondary | ICD-10-CM

## 2019-06-06 MED ORDER — SILVER SULFADIAZINE 1 % EX CREA: 1.0000 "application " | TOPICAL_CREAM | Freq: Every day | CUTANEOUS | 0 refills | Status: DC

## 2019-06-06 MED ORDER — SULFAMETHOXAZOLE-TRIMETHOPRIM 800-160 MG PO TABS: 1.0000 | ORAL_TABLET | Freq: Two times a day (BID) | ORAL | 0 refills | Status: DC

## 2019-06-06 NOTE — Telephone Encounter (Signed)
Patient called advised the name of the facility is [facility]. The number to contact patient if needed is [phone]

## 2019-06-06 NOTE — Telephone Encounter (Signed)
Ok, thank you. Noted

## 2019-06-06 NOTE — Progress Notes (Addendum)
Office Visit Note   Patient: Jennifer [name]           Date of Birth: 1961/03/22           MRN: [id number] Visit Date: 06/06/2019              Requested by: Bartholomew Crews, MD [address] [place],  [place] [address] PCP: Bartholomew Crews, MD  Chief Complaint  Patient presents with  . Left Leg - Routine Post Op    04/01/2019 BKA revision left leg      HPI: The patient is a 58 year old woman who is seen for a new ulceration over her right foot.  She previously underwent a left below the knee revision earlier this year and has healed well from this and is wearing a medical stump shrinker stocking.  She has been working with [facility] for a prosthesis. She reports that she accidentally scraped her right foot on her walker last week and noticed some bleeding and has subsequently developed worsening of the wound.  She denies any fever or chills.  She has insensate diabetic polyneuropathy and is not experiencing pain over the area.  She has been working with [employer] [facility] home health care for her other home care needs.  Assessment & Plan: Visit Diagnoses:  1. Skin ulcer of right foot, limited to breakdown of skin ([place])   2. Acquired absence of left leg below knee (HCC)   3. Type 2 diabetes mellitus with diabetic polyneuropathy, with long-term current use of insulin ([place])   4. Moderate protein-calorie malnutrition (HCC)     Plan: Begin Bactrim DS 1 po BID. Silvadene cream to the dorsal ulcer over the right foot daily after washing with soap and water.Orders given to the patient to give to [facility] home health care for dressing changes with Silvadene cream daily after washing the area with soap and water. Given prescription for Vive compression sock, 2 XL and patient states she will try to obtain when she can. We will do dressing changes in the interim until medical compression sock obtained.   Follow up in 1 week.   Follow-Up Instructions: Return in about 1 week (around  06/13/2019).   Ortho Exam  Patient is alert, oriented, no adenopathy, well-dressed, normal affect, normal respiratory effort. Her left transtibial amputation site is well-healed and consolidating well.  She is wearing a medical shrinker stocking over the residual limb.  She has good extension and flexion at the knee. The right foot has an ulceration over the dorsum with tannish drainage.  There is some odor over the area.  Mild periwound erythema but no overt cellulitis of the foot or lower leg.  There is some mild bleeding from the wound after cleaning it with dry gauze.  She has biphasic pulses by Doppler.  There is edema of the right lower extremity and we discussed obtaining some medical compression socks.  Her right calf is 46 cm and 2 XL compression socks were recommended.  Imaging: No results found.   Labs: Lab Results  Component Value Date   HGBA1C 9.8 (A) 05/18/2019   HGBA1C 9.3 (A) 02/22/2019   HGBA1C 8.2 (A) 10/21/2018   ESRSEDRATE 84 (H) 01/27/2013   ESRSEDRATE 65 (H) 01/26/2013   ESRSEDRATE 15 08/13/2009   CRP 31.2 (H) 01/27/2013   REPTSTATUS 05/13/2019 FINAL 05/11/2019   GRAMSTAIN  01/15/2017    RARE WBC PRESENT,BOTH PMN AND MONONUCLEAR NO ORGANISMS SEEN    CULT (A) 05/11/2019    >=  100,000 COLONIES/mL VANCOMYCIN RESISTANT ENTEROCOCCUS ISOLATED   LABORGA VANCOMYCIN RESISTANT ENTEROCOCCUS ISOLATED (A) 05/11/2019     Lab Results  Component Value Date   ALBUMIN 3.1 (L) 05/14/2019   ALBUMIN 3.2 (L) 05/13/2019   ALBUMIN 3.5 05/11/2019    Body mass index is 38.18 kg/m.  Orders:  No orders of the defined types were placed in this encounter.  Meds ordered this encounter  Medications  . silver sulfADIAZINE (SILVADENE) 1 % cream    Sig: Apply 1 application topically daily. To right foot daily    Dispense:  50 g    Refill:  0  . sulfamethoxazole-trimethoprim (BACTRIM DS) 800-160 MG tablet    Sig: Take 1 tablet by mouth 2 (two) times daily.    Dispense:  28  tablet    Refill:  0     Procedures: No procedures performed  Clinical Data: No additional findings.  ROS:  All other systems negative, except as noted in the HPI. Review of Systems  Objective: Vital Signs: Ht 5\' 7"  (1.702 m)   Wt 243 lb 12.8 oz (110.6 kg)   BMI 38.18 kg/m   Specialty Comments:  No specialty comments available.  PMFS History: Patient Active Problem List   Diagnosis Date Noted  . AKI (acute kidney injury) ([place])   . Encephalopathy, toxic 05/11/2019  . Fatigue 04/06/2019  . Oropharyngeal candidiasis 03/25/2019  . Vulvar cysts 01/27/2019  . Urinary incontinence 05/13/2018  . OSA (obstructive sleep apnea) 06/12/2017  . Morbid obesity with BMI of 40.0-44.9, adult ([place]) 03/26/2017  . Toe amputation status, right 01/16/2017  . Thrombocytopenia ([place]) 06/05/2016  . 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 Type 2 diabetes with nephropathy ([place]) 09/07/2015  . Uncontrolled diabetes mellitus with retinopathy, due to underlying condition, without macular edema ([place]) 09/05/2015  . Diabetic retinopathy ([place]) 09/05/2015  . Counseling regarding end of life decision making 06/14/2015  . Atherosclerosis of aorta ([place]) 04/04/2015  . Anemia 10/05/2014  . Chronic diastolic heart failure ([place])   . Tobacco abuse   . Severe obesity (BMI >= 40) ([place]) 03/02/2013  . Abnormality of gait 03/01/2013  . Healthcare maintenance 07/10/2012  . Chronic prescription opiate use 12/03/2011  . Secondary diabetes mellitus with peripheral vascular disease ([place]) 08/27/2011  . 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 Hypertension associated with diabetes ([place]) 11/29/2009  . Chronic insomnia 10/25/2009  . GASTROESOPHAGEAL REFLUX DISEASE 11/24/2008  . Depression, major, severe recurrence ([place]) 04/06/2008  . DM (diabetes mellitus) type II uncontrolled, periph vascular disorder ([place]) 04/02/2007  . Hyperlipidemia associated with type 2 diabetes mellitus ([place]) 01/08/2007  . COPD (chronic  obstructive pulmonary disease) ([place]) 01/08/2007   Past Medical History:  Diagnosis Date  . Anginal pain [facility])    '3' of 10 ischemia ruled out 9/9   . Arthritis of lumbar spine   . Asthma   . Cataract   . CHF (congestive heart failure) ([place])   . 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 Chronic bronchitis ([place])    "I get it alot" (09/28/2013)  . Chronic diastolic heart failure (HCC)    grade 2 per 2D echocardiogram (01/2013)  . Chronic lower back pain   . Chronic osteomyelitis of foot (HCC)    chronic, right secondary to diabetic foot ulcers  . Chronic pain syndrome 12/03/2011   Likely secondary to depression, "fibromyalgia", neuropathy, and obesity. Lumbar MRI 2014 no sig change from prior (2008) : Stable hypertrophic facet disease most notable at  L4-5. Stable shallow left foraminal/extraforaminal disc protrusion at L4-5. No direct neural compression.      Marland Kitchen COPD 01/08/2007   PFT's 05/2007 : FEV1/FVC 82, FEV1 64% pred, FEF 25-75% 40% predicted, 16% improvement in FEV1 with bronchodilators.     . Depression   . Diabetes mellitus without complication ([place])    Type II  . Diabetic peripheral neuropathy ([place])   . DVT of upper extremity (deep vein thrombosis) ([place]) 03/11/2013   Secondary to PICC line. Right brachial vein, diagnosed on 03/10/2013 Coumadin for 3 months. End date 06/10/2013   . Dyspnea    "smoker"  . Environmental allergies    Hx: of  . Exertional shortness of breath   . Fatty liver 2003   observed on ultrasound abdomen  . Fibromyalgia   . GERD (gastroesophageal reflux disease)   . Glaucoma   . History of use of hearing aid   . Hyperlipidemia   . Hyperplastic colon polyp 12/2010   Per colonoscopy (12/2010) - Dr. Deatra Ina  . Hypertension   . 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 Blood cultures 01/26/13 S. Aureus and GBS. Blood cultures Feb 6th, 8th, and 9th and March were negative.Repeat TEE 3/20 negative  for vegitations  . 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 2/2 tobacco use, DM, and cont weight bearing on surgical wound and developed gangrene   . Pneumonia   . Polymicrobial bacterial infection 01/2013   GBS and S. aureus bacteremia // Source likely infected diabetic foot ulcer  . PVD (peripheral vascular disease) with claudication ([place])    Stents to bilateral common iliac arteries (left 2005, right 2008), on chronic plavix  . Rheumatoid arthritis ([place])   . S/P BKA (below knee amputation) unilateral [facility])    Oct 2014 L - failed limb preserving treatment. 2/2 tobacco use, DM, and cont weight bearing on surgical wound and developed gangrene   . Tobacco abuse   . Type II diabetes mellitus with peripheral circulatory disorders, uncontrolled DX: 1993   Insulin dep. Poor control. Complicated by diabetic foot ulcer and diabetic eye disease.    Marland Kitchen Ulcer of foot, chronic (HCC)    Left. No OM per MRI (01/2013)    Family History  Problem Relation Age of Onset  . Diverticulosis Mother   . Diabetes Mother   . Hypertension Mother   . Congestive Heart Failure Mother   . Asthma Father   . CAD Sister 48       MI at age 33 per patient.  However, she has not had a stent or CABG.   . Heart disease Sister        before age 5  . Breast cancer Neg Hx     Past Surgical History:  Procedure Laterality Date  . ABDOMINAL HYSTERECTOMY  1997   secondary to uterine fibroids  . AMPUTATION Left 08/31/2013   Procedure: AMPUTATION RAY;  Surgeon: Newt Minion, MD;  Location: [facility];  Service: Orthopedics;  Laterality: Left;  Left Foot 5th Ray Amputation  . AMPUTATION Left 09/28/2013   Procedure: Left Midfoot amputation;  Surgeon: Newt Minion, MD;  Location: [facility];  Service: Orthopedics;  Laterality: Left;  Left Midfoot amputation  . AMPUTATION Left 10/14/2013   Procedure: AMPUTATION BELOW KNEE- left;  Surgeon: Newt Minion, MD;  Location:  [facility];  Service: Orthopedics;  Laterality: Left;  Left Below  Knee Amputation   . AMPUTATION TOE Right 01/15/2017   Procedure: AMPUTATION 5th TOE RIGHT FOOT;  Surgeon: Edrick Kins, DPM;  Location: [facility];  Service: Podiatry;  Laterality: Right;  . APPLICATION OF WOUND VAC  04/01/2019   Procedure: Application Of Wound Vac;  Surgeon: Newt Minion, MD;  Location: [facility];  Service: Orthopedics;;  . BLADDER SURGERY     bladder reconstruction surgery  . BREAST BIOPSY     multiple-benign per pt  . COLONOSCOPY    . ESOPHAGOGASTRODUODENOSCOPY N/A 09/20/2013   Procedure: ESOPHAGOGASTRODUODENOSCOPY (EGD);  Surgeon: Jerene Bears, MD;  Location: [facility];  Service: Gastroenterology;  Laterality: N/A;  . FOOT AMPUTATION THROUGH METATARSAL Left 09/28/2013  . GANGLION CYST EXCISION     multiple  . PERIPHERAL VASCULAR INTERVENTION     stents in lower ext  . SHOULDER ARTHROSCOPY W/ ROTATOR CUFF REPAIR Bilateral   . SKIN SPLIT GRAFT Bilateral 05/13/2013   Procedure: Right and Left Foot Allograft Skin Graft;  Surgeon: Newt Minion, MD;  Location: [facility];  Service: Orthopedics;  Laterality: Bilateral;  Right and Left Foot Allograft Skin Graft  . STUMP REVISION Left 04/01/2019   Procedure: REVISION LEFT BELOW KNEE AMPUTATION;  Surgeon: Newt Minion, MD;  Location: [facility];  Service: Orthopedics;  Laterality: Left;  . TEE WITHOUT CARDIOVERSION N/A 01/31/2013   Procedure: TRANSESOPHAGEAL ECHOCARDIOGRAM (TEE);  Surgeon: Fay Records, MD;  Location: [facility];  Service: Cardiovascular;  Laterality: N/A;  Rm [phone]  . TEE WITHOUT CARDIOVERSION N/A 03/10/2013   Procedure: TRANSESOPHAGEAL ECHOCARDIOGRAM (TEE);  Surgeon: Larey Dresser, MD;  Location: [facility];  Service: Cardiovascular;  Laterality: N/A;  Rm. [phone]  . TOE AMPUTATION Left 08/31/2013   4TH & 5 TH TOE   . TONSILLECTOMY    . TUBAL LIGATION    . WRIST SURGERY Right    "for tumors" (09/28/2013)   Social History   Occupational History  . Occupation:  Disability    Comment: previously worked as a Engineer, materials  . Smoking status: Current Every Day Smoker    Packs/day: 1.00    Years: 50.00    Pack years: 50.00    Types: Cigarettes  . Smokeless tobacco: Never Used  . Tobacco comment: 1 PPD  Substance and Sexual Activity  . Alcohol use: No    Alcohol/week: 0.0 standard drinks  . Drug use: No    Types: Marijuana, "Crack" cocaine    Comment: 09/28/2013 "no marijuana since 2011, no crack/cocaine 1989"  . Sexual activity: Not Currently

## 2019-06-08 DIAGNOSIS — Z89512 Acquired absence of left leg below knee: Secondary | ICD-10-CM | POA: Diagnosis not present

## 2019-06-08 DIAGNOSIS — G4733 Obstructive sleep apnea (adult) (pediatric): Secondary | ICD-10-CM | POA: Diagnosis not present

## 2019-06-08 DIAGNOSIS — Z794 Long term (current) use of insulin: Secondary | ICD-10-CM | POA: Diagnosis not present

## 2019-06-08 DIAGNOSIS — Z9181 History of falling: Secondary | ICD-10-CM | POA: Diagnosis not present

## 2019-06-08 DIAGNOSIS — E1140 Type 2 diabetes mellitus with diabetic neuropathy, unspecified: Secondary | ICD-10-CM | POA: Diagnosis not present

## 2019-06-08 DIAGNOSIS — I700 Atherosclerosis of aorta: Secondary | ICD-10-CM | POA: Diagnosis not present

## 2019-06-08 DIAGNOSIS — M797 Fibromyalgia: Secondary | ICD-10-CM | POA: Diagnosis not present

## 2019-06-08 DIAGNOSIS — T879 Unspecified complications of amputation stump: Secondary | ICD-10-CM | POA: Diagnosis not present

## 2019-06-08 DIAGNOSIS — J449 Chronic obstructive pulmonary disease, unspecified: Secondary | ICD-10-CM | POA: Diagnosis not present

## 2019-06-08 DIAGNOSIS — E1139 Type 2 diabetes mellitus with other diabetic ophthalmic complication: Secondary | ICD-10-CM | POA: Diagnosis not present

## 2019-06-08 DIAGNOSIS — I5032 Chronic diastolic (congestive) heart failure: Secondary | ICD-10-CM | POA: Diagnosis not present

## 2019-06-08 DIAGNOSIS — G894 Chronic pain syndrome: Secondary | ICD-10-CM | POA: Diagnosis not present

## 2019-06-08 DIAGNOSIS — T8781 Dehiscence of amputation stump: Secondary | ICD-10-CM | POA: Diagnosis not present

## 2019-06-08 DIAGNOSIS — E1151 Type 2 diabetes mellitus with diabetic peripheral angiopathy without gangrene: Secondary | ICD-10-CM | POA: Diagnosis not present

## 2019-06-08 DIAGNOSIS — I152 Hypertension secondary to endocrine disorders: Secondary | ICD-10-CM | POA: Diagnosis not present

## 2019-06-08 DIAGNOSIS — E1165 Type 2 diabetes mellitus with hyperglycemia: Secondary | ICD-10-CM | POA: Diagnosis not present

## 2019-06-08 DIAGNOSIS — H42 Glaucoma in diseases classified elsewhere: Secondary | ICD-10-CM | POA: Diagnosis not present

## 2019-06-08 DIAGNOSIS — M4696 Unspecified inflammatory spondylopathy, lumbar region: Secondary | ICD-10-CM | POA: Diagnosis not present

## 2019-06-08 DIAGNOSIS — Z89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): Secondary | ICD-10-CM | POA: Diagnosis not present

## 2019-06-08 DIAGNOSIS — E1121 Type 2 diabetes mellitus with diabetic nephropathy: Secondary | ICD-10-CM | POA: Diagnosis not present

## 2019-06-08 DIAGNOSIS — E11319 Type 2 diabetes mellitus with unspecified diabetic retinopathy without macular edema: Secondary | ICD-10-CM | POA: Diagnosis not present

## 2019-06-09 ENCOUNTER — Telehealth: Payer: Self-pay | Admitting: Orthopedic Surgery

## 2019-06-09 DIAGNOSIS — E1139 Type 2 diabetes mellitus with other diabetic ophthalmic complication: Secondary | ICD-10-CM | POA: Diagnosis not present

## 2019-06-09 DIAGNOSIS — M797 Fibromyalgia: Secondary | ICD-10-CM | POA: Diagnosis not present

## 2019-06-09 DIAGNOSIS — I700 Atherosclerosis of aorta: Secondary | ICD-10-CM | POA: Diagnosis not present

## 2019-06-09 DIAGNOSIS — M4696 Unspecified inflammatory spondylopathy, lumbar region: Secondary | ICD-10-CM | POA: Diagnosis not present

## 2019-06-09 DIAGNOSIS — T8781 Dehiscence of amputation stump: Secondary | ICD-10-CM | POA: Diagnosis not present

## 2019-06-09 DIAGNOSIS — E1165 Type 2 diabetes mellitus with hyperglycemia: Secondary | ICD-10-CM | POA: Diagnosis not present

## 2019-06-09 DIAGNOSIS — Z794 Long term (current) use of insulin: Secondary | ICD-10-CM | POA: Diagnosis not present

## 2019-06-09 DIAGNOSIS — I152 Hypertension secondary to endocrine disorders: Secondary | ICD-10-CM | POA: Diagnosis not present

## 2019-06-09 DIAGNOSIS — I5032 Chronic diastolic (congestive) heart failure: Secondary | ICD-10-CM | POA: Diagnosis not present

## 2019-06-09 DIAGNOSIS — T879 Unspecified complications of amputation stump: Secondary | ICD-10-CM | POA: Diagnosis not present

## 2019-06-09 DIAGNOSIS — E1121 Type 2 diabetes mellitus with diabetic nephropathy: Secondary | ICD-10-CM | POA: Diagnosis not present

## 2019-06-09 DIAGNOSIS — E1151 Type 2 diabetes mellitus with diabetic peripheral angiopathy without gangrene: Secondary | ICD-10-CM | POA: Diagnosis not present

## 2019-06-09 DIAGNOSIS — J449 Chronic obstructive pulmonary disease, unspecified: Secondary | ICD-10-CM | POA: Diagnosis not present

## 2019-06-09 DIAGNOSIS — G4733 Obstructive sleep apnea (adult) (pediatric): Secondary | ICD-10-CM | POA: Diagnosis not present

## 2019-06-09 DIAGNOSIS — E11319 Type 2 diabetes mellitus with unspecified diabetic retinopathy without macular edema: Secondary | ICD-10-CM | POA: Diagnosis not present

## 2019-06-09 DIAGNOSIS — Z9181 History of falling: Secondary | ICD-10-CM | POA: Diagnosis not present

## 2019-06-09 DIAGNOSIS — G894 Chronic pain syndrome: Secondary | ICD-10-CM | POA: Diagnosis not present

## 2019-06-09 DIAGNOSIS — Z89512 Acquired absence of left leg below knee: Secondary | ICD-10-CM | POA: Diagnosis not present

## 2019-06-09 DIAGNOSIS — Z4781 Encounter for orthopedic aftercare following surgical amputation: Secondary | ICD-10-CM | POA: Diagnosis not present

## 2019-06-09 DIAGNOSIS — Z89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): Secondary | ICD-10-CM | POA: Diagnosis not present

## 2019-06-09 DIAGNOSIS — E1140 Type 2 diabetes mellitus with diabetic neuropathy, unspecified: Secondary | ICD-10-CM | POA: Diagnosis not present

## 2019-06-09 DIAGNOSIS — H42 Glaucoma in diseases classified elsewhere: Secondary | ICD-10-CM | POA: Diagnosis not present

## 2019-06-09 NOTE — Telephone Encounter (Signed)
I called and sw Jennifer [name] and she would like to fax over an order for a smaller wheelchair for signature. She said that the wheelchair that she has is too big and she is not able to use it in her home. Advised that we would sign for this to fax to my attention and I will make sure that this gets back to her.

## 2019-06-09 NOTE — Telephone Encounter (Signed)
Meredith-(OT) with [facility] called stating patient fell yesterday. She said patient is sore from the fall. Ailene Ravel also asked if an order can be put in for a smaller wheel chair? The number to contact Ailene Ravel is [phone]

## 2019-06-10 ENCOUNTER — Telehealth: Payer: Self-pay

## 2019-06-10 DIAGNOSIS — E1140 Type 2 diabetes mellitus with diabetic neuropathy, unspecified: Secondary | ICD-10-CM | POA: Diagnosis not present

## 2019-06-10 DIAGNOSIS — E1151 Type 2 diabetes mellitus with diabetic peripheral angiopathy without gangrene: Secondary | ICD-10-CM | POA: Diagnosis not present

## 2019-06-10 DIAGNOSIS — J449 Chronic obstructive pulmonary disease, unspecified: Secondary | ICD-10-CM | POA: Diagnosis not present

## 2019-06-10 DIAGNOSIS — M4696 Unspecified inflammatory spondylopathy, lumbar region: Secondary | ICD-10-CM | POA: Diagnosis not present

## 2019-06-10 DIAGNOSIS — I152 Hypertension secondary to endocrine disorders: Secondary | ICD-10-CM | POA: Diagnosis not present

## 2019-06-10 DIAGNOSIS — Z89512 Acquired absence of left leg below knee: Secondary | ICD-10-CM | POA: Diagnosis not present

## 2019-06-10 DIAGNOSIS — I700 Atherosclerosis of aorta: Secondary | ICD-10-CM | POA: Diagnosis not present

## 2019-06-10 DIAGNOSIS — Z9181 History of falling: Secondary | ICD-10-CM | POA: Diagnosis not present

## 2019-06-10 DIAGNOSIS — Z89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): Secondary | ICD-10-CM | POA: Diagnosis not present

## 2019-06-10 DIAGNOSIS — Z794 Long term (current) use of insulin: Secondary | ICD-10-CM | POA: Diagnosis not present

## 2019-06-10 DIAGNOSIS — E1165 Type 2 diabetes mellitus with hyperglycemia: Secondary | ICD-10-CM | POA: Diagnosis not present

## 2019-06-10 DIAGNOSIS — E1139 Type 2 diabetes mellitus with other diabetic ophthalmic complication: Secondary | ICD-10-CM | POA: Diagnosis not present

## 2019-06-10 DIAGNOSIS — I5032 Chronic diastolic (congestive) heart failure: Secondary | ICD-10-CM | POA: Diagnosis not present

## 2019-06-10 DIAGNOSIS — H42 Glaucoma in diseases classified elsewhere: Secondary | ICD-10-CM | POA: Diagnosis not present

## 2019-06-10 DIAGNOSIS — E1121 Type 2 diabetes mellitus with diabetic nephropathy: Secondary | ICD-10-CM | POA: Diagnosis not present

## 2019-06-10 DIAGNOSIS — T8781 Dehiscence of amputation stump: Secondary | ICD-10-CM | POA: Diagnosis not present

## 2019-06-10 DIAGNOSIS — T879 Unspecified complications of amputation stump: Secondary | ICD-10-CM | POA: Diagnosis not present

## 2019-06-10 DIAGNOSIS — M797 Fibromyalgia: Secondary | ICD-10-CM | POA: Diagnosis not present

## 2019-06-10 DIAGNOSIS — G4733 Obstructive sleep apnea (adult) (pediatric): Secondary | ICD-10-CM | POA: Diagnosis not present

## 2019-06-10 DIAGNOSIS — E11319 Type 2 diabetes mellitus with unspecified diabetic retinopathy without macular edema: Secondary | ICD-10-CM | POA: Diagnosis not present

## 2019-06-10 DIAGNOSIS — G894 Chronic pain syndrome: Secondary | ICD-10-CM | POA: Diagnosis not present

## 2019-06-10 NOTE — Telephone Encounter (Signed)
See message.

## 2019-06-10 NOTE — Telephone Encounter (Signed)
Jennifer [name] was called and informed patient of wtb status and ROM. Patient can wtab and Jennifer [name] also stated that patient was okay and will be seen on Monday. VO was given.

## 2019-06-13 ENCOUNTER — Other Ambulatory Visit: Payer: Self-pay

## 2019-06-13 ENCOUNTER — Encounter: Payer: Self-pay | Admitting: Physician Assistant

## 2019-06-13 ENCOUNTER — Ambulatory Visit (INDEPENDENT_AMBULATORY_CARE_PROVIDER_SITE_OTHER): Payer: Medicare Other | Admitting: Physician Assistant

## 2019-06-13 VITALS — Ht 67.0 in | Wt 243.0 lb

## 2019-06-13 DIAGNOSIS — Z794 Long term (current) use of insulin: Secondary | ICD-10-CM | POA: Diagnosis not present

## 2019-06-13 DIAGNOSIS — E1142 Type 2 diabetes mellitus with diabetic polyneuropathy: Secondary | ICD-10-CM | POA: Diagnosis not present

## 2019-06-13 DIAGNOSIS — Z89512 Acquired absence of left leg below knee: Secondary | ICD-10-CM

## 2019-06-13 DIAGNOSIS — L97511 Non-pressure chronic ulcer of other part of right foot limited to breakdown of skin: Secondary | ICD-10-CM

## 2019-06-13 DIAGNOSIS — E440 Moderate protein-calorie malnutrition: Secondary | ICD-10-CM | POA: Diagnosis not present

## 2019-06-13 NOTE — Progress Notes (Signed)
Office Visit Note   Patient: Jennifer [name]           Date of Birth: 1961-02-23           MRN: [id number] Visit Date: 06/13/2019              Requested by: Bartholomew Crews, MD [address] [place],  [place] [address] PCP: Bartholomew Crews, MD  Chief Complaint  Patient presents with  . Right Foot - Wound Check      HPI: The patient is a 58 yo woman who is seen for follow up of a new ulcer over the dorsum of her right foot. She has been on Bactrim DS for the past week and dressing the area with silvadene cream daily after washing the area. She reports a very small area opened over the right 2nd toe as well and she started some silvadene cream to the area as well. She did obtain Vive medical compression socks but has not started using as she would like to continue to use the silvadene for now as it is helping a great deal. She is pleased with the improvement over the past week.   Assessment & Plan: Visit Diagnoses:  1. Skin ulcer of right foot, limited to breakdown of skin ([place])   2. Acquired absence of left leg below knee (HCC)   3. Type 2 diabetes mellitus with diabetic polyneuropathy, with long-term current use of insulin ([place])   4. Moderate protein-calorie malnutrition (HCC)     Plan: Continue Bactrim DS 1 po BID. Continue silvadene to the open areas over the right dorsal foot and the right 2nd toe and cover with dry gauze and ace wrap after washing daily. Continue to off load and elevated as much as possible. Follow up in 1 week. Hopefully can begin wearing the medical compression sock next week.   Follow-Up Instructions: Return in about 1 week (around 06/20/2019).   Ortho Exam  Patient is alert, oriented, no adenopathy, well-dressed, normal affect, normal respiratory effort. The right dorsal foot wound is much improved with improved pink granulation, scant slough. No signs of infection or peri wound irritation. No cellulitis of the foot. 5 mm superficial skin  ulcer over the right 2nd toe without signs of infection or sausage toe. Good palpable pedal pulses.  Imaging: No results found.   Labs: Lab Results  Component Value Date   HGBA1C 9.8 (A) 05/18/2019   HGBA1C 9.3 (A) 02/22/2019   HGBA1C 8.2 (A) 10/21/2018   ESRSEDRATE 84 (H) 01/27/2013   ESRSEDRATE 65 (H) 01/26/2013   ESRSEDRATE 15 08/13/2009   CRP 31.2 (H) 01/27/2013   REPTSTATUS 05/13/2019 FINAL 05/11/2019   GRAMSTAIN  01/15/2017    RARE WBC PRESENT,BOTH PMN AND MONONUCLEAR NO ORGANISMS SEEN    CULT (A) 05/11/2019    >=100,000 COLONIES/mL VANCOMYCIN RESISTANT ENTEROCOCCUS ISOLATED   LABORGA VANCOMYCIN RESISTANT ENTEROCOCCUS ISOLATED (A) 05/11/2019     Lab Results  Component Value Date   ALBUMIN 3.1 (L) 05/14/2019   ALBUMIN 3.2 (L) 05/13/2019   ALBUMIN 3.5 05/11/2019    Body mass index is 38.06 kg/m.  Orders:  No orders of the defined types were placed in this encounter.  No orders of the defined types were placed in this encounter.    Procedures: No procedures performed  Clinical Data: No additional findings.  ROS:  All other systems negative, except as noted in the HPI. Review of Systems  Objective: Vital Signs: Ht 5\' 7"  (1.702 m)  Wt 243 lb (110.2 kg)   BMI 38.06 kg/m   Specialty Comments:  No specialty comments available.  PMFS History: Patient Active Problem List   Diagnosis Date Noted  . AKI (acute kidney injury) ([place])   . Encephalopathy, toxic 05/11/2019  . Fatigue 04/06/2019  . Oropharyngeal candidiasis 03/25/2019  . Vulvar cysts 01/27/2019  . Urinary incontinence 05/13/2018  . OSA (obstructive sleep apnea) 06/12/2017  . Morbid obesity with BMI of 40.0-44.9, adult ([place]) 03/26/2017  . Toe amputation status, right 01/16/2017  . Thrombocytopenia ([place]) 06/05/2016  . 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 Type 2 diabetes with nephropathy ([place]) 09/07/2015  . Uncontrolled diabetes mellitus with retinopathy, due to underlying condition, without macular  edema ([place]) 09/05/2015  . Diabetic retinopathy ([place]) 09/05/2015  . Counseling regarding end of life decision making 06/14/2015  . Atherosclerosis of aorta ([place]) 04/04/2015  . Anemia 10/05/2014  . Chronic diastolic heart failure ([place])   . Tobacco abuse   . Severe obesity (BMI >= 40) ([place]) 03/02/2013  . Abnormality of gait 03/01/2013  . Healthcare maintenance 07/10/2012  . Chronic prescription opiate use 12/03/2011  . Secondary diabetes mellitus with peripheral vascular disease ([place]) 08/27/2011  . 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 Hypertension associated with diabetes ([place]) 11/29/2009  . Chronic insomnia 10/25/2009  . GASTROESOPHAGEAL REFLUX DISEASE 11/24/2008  . Depression, major, severe recurrence ([place]) 04/06/2008  . DM (diabetes mellitus) type II uncontrolled, periph vascular disorder ([place]) 04/02/2007  . Hyperlipidemia associated with type 2 diabetes mellitus ([place]) 01/08/2007  . COPD (chronic obstructive pulmonary disease) ([place]) 01/08/2007   Past Medical History:  Diagnosis Date  . Anginal pain [facility])    '3' of 10 ischemia ruled out 9/9   . Arthritis of lumbar spine   . Asthma   . Cataract   . CHF (congestive heart failure) ([place])   . 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 Chronic bronchitis ([place])    "I get it alot" (09/28/2013)  . Chronic diastolic heart failure (HCC)    grade 2 per 2D echocardiogram (01/2013)  . Chronic lower back pain   . Chronic osteomyelitis of foot (HCC)    chronic, right secondary to diabetic foot ulcers  . Chronic pain syndrome 12/03/2011   Likely secondary to depression, "fibromyalgia", neuropathy, and obesity. Lumbar MRI 2014 no sig change from prior (2008) : Stable hypertrophic facet disease most notable at L4-5. Stable shallow left foraminal/extraforaminal disc protrusion at L4-5. No direct neural compression.      Marland Kitchen COPD 01/08/2007   PFT's 05/2007 : FEV1/FVC 82, FEV1 64% pred, FEF 25-75% 40% predicted, 16%  improvement in FEV1 with bronchodilators.     . Depression   . Diabetes mellitus without complication ([place])    Type II  . Diabetic peripheral neuropathy ([place])   . DVT of upper extremity (deep vein thrombosis) ([place]) 03/11/2013   Secondary to PICC line. Right brachial vein, diagnosed on 03/10/2013 Coumadin for 3 months. End date 06/10/2013   . Dyspnea    "smoker"  . Environmental allergies    Hx: of  . Exertional shortness of breath   . Fatty liver 2003   observed on ultrasound abdomen  . Fibromyalgia   . GERD (gastroesophageal reflux disease)   . Glaucoma   . History of use of hearing aid   . Hyperlipidemia   . Hyperplastic colon polyp 12/2010   Per colonoscopy (12/2010) - Dr. Deatra Ina  . Hypertension   . Infective endocarditis 01/2013  TEE 2/14 : Endocarditis involving mitral and tricuspid valves. Blood cultures 01/26/13 S. Aureus and GBS. Blood cultures Feb 6th, 8th, and 9th and March were negative.Repeat TEE 3/20 negative for vegitations  . 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 2/2 tobacco use, DM, and cont weight bearing on surgical wound and developed gangrene   . Pneumonia   . Polymicrobial bacterial infection 01/2013   GBS and S. aureus bacteremia // Source likely infected diabetic foot ulcer  . PVD (peripheral vascular disease) with claudication ([place])    Stents to bilateral common iliac arteries (left 2005, right 2008), on chronic plavix  . Rheumatoid arthritis ([place])   . S/P BKA (below knee amputation) unilateral [facility])    Oct 2014 L - failed limb preserving treatment. 2/2 tobacco use, DM, and cont weight bearing on surgical wound and developed gangrene   . Tobacco abuse   . Type II diabetes mellitus with peripheral circulatory disorders, uncontrolled DX: 1993   Insulin dep. Poor control. Complicated by diabetic foot ulcer and diabetic eye disease.    Marland Kitchen Ulcer of foot, chronic (HCC)    Left. No OM per MRI  (01/2013)    Family History  Problem Relation Age of Onset  . Diverticulosis Mother   . Diabetes Mother   . Hypertension Mother   . Congestive Heart Failure Mother   . Asthma Father   . CAD Sister 97       MI at age 47 per patient.  However, she has not had a stent or CABG.   . Heart disease Sister        before age 81  . Breast cancer Neg Hx     Past Surgical History:  Procedure Laterality Date  . ABDOMINAL HYSTERECTOMY  1997   secondary to uterine fibroids  . AMPUTATION Left 08/31/2013   Procedure: AMPUTATION RAY;  Surgeon: Newt Minion, MD;  Location: [facility];  Service: Orthopedics;  Laterality: Left;  Left Foot 5th Ray Amputation  . AMPUTATION Left 09/28/2013   Procedure: Left Midfoot amputation;  Surgeon: Newt Minion, MD;  Location: [facility];  Service: Orthopedics;  Laterality: Left;  Left Midfoot amputation  . AMPUTATION Left 10/14/2013   Procedure: AMPUTATION BELOW KNEE- left;  Surgeon: Newt Minion, MD;  Location: [facility];  Service: Orthopedics;  Laterality: Left;  Left Below Knee Amputation   . AMPUTATION TOE Right 01/15/2017   Procedure: AMPUTATION 5th TOE RIGHT FOOT;  Surgeon: Edrick Kins, DPM;  Location: [facility] [place];  Service: Podiatry;  Laterality: Right;  . APPLICATION OF WOUND VAC  04/01/2019   Procedure: Application Of Wound Vac;  Surgeon: Newt Minion, MD;  Location: [facility];  Service: Orthopedics;;  . BLADDER SURGERY     bladder reconstruction surgery  . BREAST BIOPSY     multiple-benign per pt  . COLONOSCOPY    . ESOPHAGOGASTRODUODENOSCOPY N/A 09/20/2013   Procedure: ESOPHAGOGASTRODUODENOSCOPY (EGD);  Surgeon: Jerene Bears, MD;  Location: [facility];  Service: Gastroenterology;  Laterality: N/A;  . FOOT AMPUTATION THROUGH METATARSAL Left 09/28/2013  . GANGLION CYST EXCISION     multiple  . PERIPHERAL VASCULAR INTERVENTION     stents in lower ext  . SHOULDER ARTHROSCOPY W/ ROTATOR CUFF REPAIR Bilateral   . SKIN SPLIT GRAFT Bilateral 05/13/2013   Procedure: Right and  Left Foot Allograft Skin Graft;  Surgeon: Newt Minion, MD;  Location: [facility];  Service: Orthopedics;  Laterality: Bilateral;  Right and Left Foot Allograft Skin Graft  . STUMP REVISION Left 04/01/2019   Procedure: REVISION LEFT BELOW KNEE AMPUTATION;  Surgeon: Newt Minion, MD;  Location: [facility];  Service: Orthopedics;  Laterality: Left;  . TEE WITHOUT CARDIOVERSION N/A 01/31/2013   Procedure: TRANSESOPHAGEAL ECHOCARDIOGRAM (TEE);  Surgeon: Fay Records, MD;  Location: [place];  Service: Cardiovascular;  Laterality: N/A;  Rm [phone]  . TEE WITHOUT CARDIOVERSION N/A 03/10/2013   Procedure: TRANSESOPHAGEAL ECHOCARDIOGRAM (TEE);  Surgeon: Larey Dresser, MD;  Location: [place];  Service: Cardiovascular;  Laterality: N/A;  Rm. [phone]  . TOE AMPUTATION Left 08/31/2013   4TH & 5 TH TOE   . TONSILLECTOMY    . TUBAL LIGATION    . WRIST SURGERY Right    "for tumors" (09/28/2013)   Social History   Occupational History  . Occupation: Disability    Comment: previously worked as a Engineer, materials  . Smoking status: Current Every Day Smoker    Packs/day: 1.00    Years: 50.00    Pack years: 50.00    Types: Cigarettes  . Smokeless tobacco: Never Used  . Tobacco comment: 1 PPD  Substance and Sexual Activity  . Alcohol use: No    Alcohol/week: 0.0 standard drinks  . Drug use: No    Types: Marijuana, "Crack" cocaine    Comment: 09/28/2013 "no marijuana since 2011, no crack/cocaine 1989"  . Sexual activity: Not Currently

## 2019-06-14 ENCOUNTER — Other Ambulatory Visit: Payer: Self-pay | Admitting: Internal Medicine

## 2019-06-14 DIAGNOSIS — I700 Atherosclerosis of aorta: Secondary | ICD-10-CM | POA: Diagnosis not present

## 2019-06-14 DIAGNOSIS — Z89512 Acquired absence of left leg below knee: Secondary | ICD-10-CM | POA: Diagnosis not present

## 2019-06-14 DIAGNOSIS — E1140 Type 2 diabetes mellitus with diabetic neuropathy, unspecified: Secondary | ICD-10-CM | POA: Diagnosis not present

## 2019-06-14 DIAGNOSIS — H42 Glaucoma in diseases classified elsewhere: Secondary | ICD-10-CM | POA: Diagnosis not present

## 2019-06-14 DIAGNOSIS — M797 Fibromyalgia: Secondary | ICD-10-CM | POA: Diagnosis not present

## 2019-06-14 DIAGNOSIS — I152 Hypertension secondary to endocrine disorders: Secondary | ICD-10-CM | POA: Diagnosis not present

## 2019-06-14 DIAGNOSIS — T879 Unspecified complications of amputation stump: Secondary | ICD-10-CM | POA: Diagnosis not present

## 2019-06-14 DIAGNOSIS — M4696 Unspecified inflammatory spondylopathy, lumbar region: Secondary | ICD-10-CM | POA: Diagnosis not present

## 2019-06-14 DIAGNOSIS — G894 Chronic pain syndrome: Secondary | ICD-10-CM | POA: Diagnosis not present

## 2019-06-14 DIAGNOSIS — G4733 Obstructive sleep apnea (adult) (pediatric): Secondary | ICD-10-CM | POA: Diagnosis not present

## 2019-06-14 DIAGNOSIS — I5032 Chronic diastolic (congestive) heart failure: Secondary | ICD-10-CM | POA: Diagnosis not present

## 2019-06-14 DIAGNOSIS — E1151 Type 2 diabetes mellitus with diabetic peripheral angiopathy without gangrene: Secondary | ICD-10-CM | POA: Diagnosis not present

## 2019-06-14 DIAGNOSIS — T8781 Dehiscence of amputation stump: Secondary | ICD-10-CM | POA: Diagnosis not present

## 2019-06-14 DIAGNOSIS — E11319 Type 2 diabetes mellitus with unspecified diabetic retinopathy without macular edema: Secondary | ICD-10-CM | POA: Diagnosis not present

## 2019-06-14 DIAGNOSIS — E1121 Type 2 diabetes mellitus with diabetic nephropathy: Secondary | ICD-10-CM | POA: Diagnosis not present

## 2019-06-14 DIAGNOSIS — E1165 Type 2 diabetes mellitus with hyperglycemia: Secondary | ICD-10-CM | POA: Diagnosis not present

## 2019-06-14 DIAGNOSIS — J449 Chronic obstructive pulmonary disease, unspecified: Secondary | ICD-10-CM | POA: Diagnosis not present

## 2019-06-14 DIAGNOSIS — E1139 Type 2 diabetes mellitus with other diabetic ophthalmic complication: Secondary | ICD-10-CM | POA: Diagnosis not present

## 2019-06-14 DIAGNOSIS — Z9181 History of falling: Secondary | ICD-10-CM | POA: Diagnosis not present

## 2019-06-14 DIAGNOSIS — Z794 Long term (current) use of insulin: Secondary | ICD-10-CM | POA: Diagnosis not present

## 2019-06-14 DIAGNOSIS — Z89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): Secondary | ICD-10-CM | POA: Diagnosis not present

## 2019-06-14 DIAGNOSIS — E1159 Type 2 diabetes mellitus with other circulatory complications: Secondary | ICD-10-CM

## 2019-06-15 NOTE — Telephone Encounter (Signed)
Hydroxyzine and baclofen were stopped at her most recent discharge.

## 2019-06-16 ENCOUNTER — Ambulatory Visit (INDEPENDENT_AMBULATORY_CARE_PROVIDER_SITE_OTHER): Payer: Medicare Other | Admitting: Dietician

## 2019-06-16 ENCOUNTER — Other Ambulatory Visit: Payer: Self-pay

## 2019-06-16 ENCOUNTER — Ambulatory Visit (INDEPENDENT_AMBULATORY_CARE_PROVIDER_SITE_OTHER): Payer: Medicare Other | Admitting: Internal Medicine

## 2019-06-16 ENCOUNTER — Encounter: Payer: Self-pay | Admitting: Dietician

## 2019-06-16 ENCOUNTER — Encounter: Payer: Self-pay | Admitting: Internal Medicine

## 2019-06-16 ENCOUNTER — Telehealth: Payer: Self-pay | Admitting: Internal Medicine

## 2019-06-16 VITALS — BP 124/60 | HR 69 | Temp 98.1°F | Wt 248.6 lb

## 2019-06-16 DIAGNOSIS — Z79891 Long term (current) use of opiate analgesic: Secondary | ICD-10-CM | POA: Diagnosis not present

## 2019-06-16 DIAGNOSIS — R32 Unspecified urinary incontinence: Secondary | ICD-10-CM

## 2019-06-16 DIAGNOSIS — R21 Rash and other nonspecific skin eruption: Secondary | ICD-10-CM

## 2019-06-16 DIAGNOSIS — E1151 Type 2 diabetes mellitus with diabetic peripheral angiopathy without gangrene: Secondary | ICD-10-CM

## 2019-06-16 DIAGNOSIS — I10 Essential (primary) hypertension: Secondary | ICD-10-CM

## 2019-06-16 DIAGNOSIS — IMO0002 Reserved for concepts with insufficient information to code with codable children: Secondary | ICD-10-CM

## 2019-06-16 DIAGNOSIS — R269 Unspecified abnormalities of gait and mobility: Secondary | ICD-10-CM

## 2019-06-16 DIAGNOSIS — Z0000 Encounter for general adult medical examination without abnormal findings: Secondary | ICD-10-CM

## 2019-06-16 DIAGNOSIS — R300 Dysuria: Secondary | ICD-10-CM | POA: Diagnosis not present

## 2019-06-16 DIAGNOSIS — E1165 Type 2 diabetes mellitus with hyperglycemia: Secondary | ICD-10-CM | POA: Diagnosis not present

## 2019-06-16 DIAGNOSIS — I152 Hypertension secondary to endocrine disorders: Secondary | ICD-10-CM

## 2019-06-16 DIAGNOSIS — Z794 Long term (current) use of insulin: Secondary | ICD-10-CM

## 2019-06-16 DIAGNOSIS — E1159 Type 2 diabetes mellitus with other circulatory complications: Secondary | ICD-10-CM

## 2019-06-16 DIAGNOSIS — M25511 Pain in right shoulder: Secondary | ICD-10-CM

## 2019-06-16 DIAGNOSIS — Z89612 Acquired absence of left leg above knee: Secondary | ICD-10-CM

## 2019-06-16 DIAGNOSIS — L299 Pruritus, unspecified: Secondary | ICD-10-CM | POA: Diagnosis not present

## 2019-06-16 DIAGNOSIS — F332 Major depressive disorder, recurrent severe without psychotic features: Secondary | ICD-10-CM

## 2019-06-16 DIAGNOSIS — Z79899 Other long term (current) drug therapy: Secondary | ICD-10-CM

## 2019-06-16 MED ORDER — CICLOPIROX OLAMINE 0.77 % EX CREA: TOPICAL_CREAM | Freq: Two times a day (BID) | CUTANEOUS | 0 refills | Status: DC

## 2019-06-16 NOTE — Assessment & Plan Note (Addendum)
Skin rash : This is a new problem.  She has had the right forearm lesion for about a year and that it would come and go.  Other lesions started 2 to 3 months ago.  All the lesions are itchy.  They have the appearance of tenia corporis and I am prescribing ciclopirox 0.77% cream twice daily for 3 weeks.  If this does not heal the lesions, then I would consider a skin biopsy or referral to dermatology.  PLAN :  0.77% cream twice daily for 3 weeks   R shoulder pain : This is a new problem.  She had bilateral rotator cuff surgery, one in 2005 and to the other in 2012.  She states currently feels exactly like she did before her rotator cuff surgery.  She has limited range of motion on ABDuction.  We discussed options of going to sports medicine or coming to [facility] for an ultrasound and she elected to come to [facility].  PLAN : [place] appointment in July 1 in the afternoon for right shoulder ultrasound and CGM download #1   She stated she is pursuing a power chair again, with a foot rest and reclining.  Capabilities.  I have not yet gotten any paperwork, but I will look.

## 2019-06-16 NOTE — Telephone Encounter (Signed)
Pt requesting a refill [facility]cod[facility] [address]

## 2019-06-16 NOTE — Patient Instructions (Addendum)
Please return in 7 days with [place] for Download #1.  Pls sch on July 2 in PM CBG download and [place] R shoulder.  Please see Dr [name] 3 months   Your blood pressure is excellent Call me if rash is no better with cream in 3 weeks

## 2019-06-16 NOTE — Progress Notes (Signed)
Documentation for Freestyle Libre Pro Continuous glucose monitoring Freestyle Libre Pro CGM sensor placed today. Patient was educated about wearing sensor, keeping food, activity and medication log and when to call office. Patient was educated about how to care for the sensor and not to have an MRI, CT or Diathermy while wearing the sensor. Follow up was arranged with the patient for 1 week.   Lot #: [id number] A Serial #: [id number] Expiration Date: 10/22/2019  [name], RD 06/16/2019 10:01 AM.

## 2019-06-16 NOTE — Assessment & Plan Note (Signed)
This problem is chronic and uncontrolled.  Her A1c was 9.8 in May.  She checks her CBG at home but left her meter at home.  Most of been in the low 200s but she had one 97 one morning.  She is on semaglutide, Tresiba 66 units, and metformin XR 500 in the morning.  She has not tolerated higher doses of metformin (note 06/30/2016).  Without a glucose monitor download and one morning CBG of 97, I do not want to increase her insulin.  And SGLT2 is not an option as she has had UTIs and fungal infections and they also have a black box warning of amputations which she is already had and is at high risk for it.  Therefore, we decided to place a continuous glucose monitor and have her return in 1 week.  We will noninsurance to make medication changes to bring her CBGs and A1c closer to goal.  She has a slight interest in a personal CGM but that would require her using pre-meal insulin 3 times a day to qualify for her own personal CGM.  This is a possibility that we could discuss with her at her follow-up appointment.  I am also checking a microalbumin today even though she is on an ACE inhibitor.  PLAN : CGM today Return to clinic for [facility] appointment along with [name] appointment for download #1 Continue current medications If she is interested in a personal CGM, I would give with a Dexcom and informed her that she would have to use pre-meal insulin 3 times a day and check her CBG 4 times a day

## 2019-06-16 NOTE — Assessment & Plan Note (Signed)
This problem is chronic and stable.  She did not bring up her Percocet today which is a first.  She has never had any red flag or orange flag behavior previously.  She was recently hospitalized for altered mental status that was likely multifactorial including medications.  Lyrica has been stopped but she told me she continues to take 300 mg 3 times daily and notices a significant improvement in her pain when she takes it.  She states that she does not want to go without her Lyrica again.  I understand the risk of numerous centrally acting medications but if she is able to verbalize that this is causing benefit and her mental status is at baseline, I think the benefits outweigh the risk and I will continue it.  I did not continue her Flexeril for her hydroxyzine when I got a refill request yesterday.  She states that she only uses the trazodone intermittently.  PLAN : UDS Continue oxycodone and pregabalin I did not refill her cyclobenzaprine or hydroxyzine

## 2019-06-16 NOTE — Assessment & Plan Note (Signed)
This problem is chronic and uncontrolled.  She is under the care of a urologist.  She states that last Monday, he prescribed her an antibiotic for a UTI but I cannot find that in epic.  She does not recognize the name and has been and continues to be on Bactrim for a wound infection.  She was also on cephalexin May 20.  She continues to complain of dysuria and urinary frequency along with urinary incontinence.  I do not think her incontinence is due to a UTI but due to the dysuria, I am checking a urine culture and UA as due to the multiple antibiotic courses she has had recently, she could have antibiotic resistance.  As regards to her urinary incontinence, she states the urologist has not mentioned any next steps such as a pessary or surgery and I encouraged her to talk to the urologist and let him or her know how severe the symptoms are and how they are affecting her life.  PLAN : UA & Urine culture Follow-up with her urologist

## 2019-06-16 NOTE — Patient Instructions (Signed)
Please record the time, amount and what food drinks and activities you have while wearing the continuous glucose monitor (CGM).  Bring the folder with you to follow up appointments. If your monitor falls off, please place it in the bag provided in your folder and bring it back with you to your next appointment.   Do not have a CT or an MRI while wearing the CGM.   1 week visit has been set up with me and a doctor for the first of two CGM downloads.   You will also return in 2 weeks to have your second download and the CGM removed.  [name], RD 06/16/2019 10:02 AM

## 2019-06-16 NOTE — Assessment & Plan Note (Signed)
This problem is chronic and unchanged.  She remains on Wellbutrin daily without any side effects.  This was not the focus of our appointment today.  PLAN:  Cont current meds

## 2019-06-16 NOTE — Telephone Encounter (Signed)
Received call from PCP stating [facility]. Sp[facility] at [facility] who states they have this Rx and will get it ready for patient. [name], RN, BSN

## 2019-06-16 NOTE — Assessment & Plan Note (Signed)
This problem is chronic and well controlled.  She is on Norvasc 10, benazepril 40, atenolol 100, and Lasix 40 as needed.  Her blood pressure is at goal.  I congratulated her on this achievement.  She is having no side effects to these medications.  PLAN:  Cont current meds   BP Readings from Last 3 Encounters:  06/16/19 124/60  05/18/19 (!) 112/49  05/14/19 (!) 155/75

## 2019-06-16 NOTE — Progress Notes (Signed)
   Subjective:    [name], female    DOB: 03-Sep-1961, 58 y.o.   MRN: [id number]  HPI  MAILYN STEICHEN is here for DM F/U. Please see the A&P for the status of the pt's chronic medical problems.  ROS : per ROS section and in problem oriented charting. All other systems are negative.  PMHx, Soc hx, and / or Fam hx : She is still living with her niece but has an appointment tomorrow to see an apartment.  Unfortunately, she does not know yet if it has stairs.  Currently, her bedroom is upstairs and that is where she stays unless her son is available to help her downstairs.   Review of Systems  Constitutional: Negative for fever.  HENT: Negative for sore throat and trouble swallowing.   Cardiovascular: Negative for chest pain.  Gastrointestinal: Negative for diarrhea and nausea.  Genitourinary: Positive for dysuria and frequency.  Musculoskeletal: Positive for arthralgias, back pain and gait problem.  Skin: Positive for rash.       Positive pruritus associated with the rash  Psychiatric/Behavioral: Positive for sleep disturbance. Negative for confusion.       Objective:   Physical Exam Vitals signs reviewed.  Constitutional:      Appearance: Normal appearance. She is obese.  HENT:     Head: Normocephalic and atraumatic.     Right Ear: External ear normal.     Left Ear: External ear normal.  Eyes:     General: No scleral icterus.       Right eye: No discharge.        Left eye: No discharge.     Conjunctiva/sclera: Conjunctivae normal.     Comments: Disconjugate gaze  Musculoskeletal:     Comments: Surgical absence of left lower extremity.  Stump incision well-healed.  Skin:    General: Skin is warm and dry.     Comments: There are scattered circular hyperpigmented lesions with slight scaling.  The largest is right forearm and is about 4 cm.  There are other lesions on fingers and forearms.  None are as large as the right forearm lesion.  One on a digit, in  the webbing, had central clearing.  Neurological:     General: No focal deficit present.     Mental Status: She is alert. Mental status is at baseline.  Psychiatric:        Mood and Affect: Mood normal.        Behavior: Behavior normal.        Thought Content: Thought content normal.        Judgment: Judgment normal.           Assessment & Plan:

## 2019-06-17 LAB — URINALYSIS, ROUTINE W REFLEX MICROSCOPIC
Bilirubin, UA: NEGATIVE
Ketones, UA: NEGATIVE
Nitrite, UA: NEGATIVE
RBC, UA: NEGATIVE
Specific Gravity, UA: 1.015 (ref 1.005–1.030)
Urobilinogen, Ur: 0.2 mg/dL (ref 0.2–1.0)
pH, UA: 5 (ref 5.0–7.5)

## 2019-06-17 LAB — MICROSCOPIC EXAMINATION
Casts: NONE SEEN /lpf
Epithelial Cells (non renal): NONE SEEN /hpf (ref 0–10)
RBC: NONE SEEN /hpf (ref 0–2)
WBC, UA: >30 /hpf — AB (ref 0–5)

## 2019-06-17 LAB — MICROALBUMIN / CREATININE URINE RATIO
Creatinine, Urine: 108.8 mg/dL
Microalb/Creat Ratio: 53 mg/g creat — ABNORMAL HIGH (ref 0–29)
Microalbumin, Urine: 58.2 ug/mL

## 2019-06-18 LAB — URINE CULTURE

## 2019-06-20 ENCOUNTER — Encounter: Payer: Self-pay | Admitting: Physician Assistant

## 2019-06-20 ENCOUNTER — Other Ambulatory Visit: Payer: Self-pay

## 2019-06-20 ENCOUNTER — Ambulatory Visit (INDEPENDENT_AMBULATORY_CARE_PROVIDER_SITE_OTHER): Payer: Medicare Other | Admitting: Physician Assistant

## 2019-06-20 VITALS — Ht 67.0 in | Wt 243.0 lb

## 2019-06-20 DIAGNOSIS — E1142 Type 2 diabetes mellitus with diabetic polyneuropathy: Secondary | ICD-10-CM

## 2019-06-20 DIAGNOSIS — Z794 Long term (current) use of insulin: Secondary | ICD-10-CM | POA: Diagnosis not present

## 2019-06-20 DIAGNOSIS — E440 Moderate protein-calorie malnutrition: Secondary | ICD-10-CM

## 2019-06-20 DIAGNOSIS — L97511 Non-pressure chronic ulcer of other part of right foot limited to breakdown of skin: Secondary | ICD-10-CM

## 2019-06-20 DIAGNOSIS — Z89512 Acquired absence of left leg below knee: Secondary | ICD-10-CM

## 2019-06-20 NOTE — Progress Notes (Signed)
Office Visit Note   Patient: Jennifer [name]           Date of Birth: January 11, 1961           MRN: [id number] Visit Date: 06/20/2019              Requested by: Bartholomew Crews, MD [address] [place],  [place] [address] PCP: Bartholomew Crews, MD  Chief Complaint  Patient presents with  . Right Foot - Wound Check      HPI: The patient is a 58 yo woman who is seen for follow up of an new ulcer over the dorsum of the right foot. She was treated with a course of Bactrim DS x 2 weeks and silvadene cream to the area. It is much improved and she has obtained a Vive medical compression sock .  She has a left transtibial amputation and this has healed well.   Assessment & Plan: Visit Diagnoses:  1. Skin ulcer of right foot, limited to breakdown of skin ([place])   2. Type 2 diabetes mellitus with diabetic polyneuropathy, with long-term current use of insulin ([place])   3. Acquired absence of left leg below knee (HCC)   4. Moderate protein-calorie malnutrition ([place])   5. Toe ulcer, right, limited to breakdown of skin ([place])     Plan: Begin Vive medical compression sock to the right leg in direct contact with the skin around the clock except for showering. Continue to wash the foot daily with soap and water and then apply sock.  Keep elevated and off load as much as possible.  Follow up in 2 weeks.   Follow-Up Instructions: Return in about 2 weeks (around 07/04/2019).   Ortho Exam  Patient is alert, oriented, no adenopathy, well-dressed, normal affect, normal respiratory effort. The wound over the right dorsal foot is much improved. Only 2 small areas open residually, ~ 1cm diameter and ~ 5 mm diameter. Very superficial 1 mm depth, with good pink granulation tissue present. She has a 2 mm superficial ulcer over the right dorsal second toe as well without signs of infection or sausage digit and we discussed that the medical compression sock will help with healing this area as well. No  signs of cellulitis of the foot or signs of infection. Vive medical compression sock applied over the right foot and demonstrated how to apply to the patient and to wear around the clock except for showering. Good pedal pulses.  Imaging: No results found. No images are attached to the encounter.  Labs: Lab Results  Component Value Date   HGBA1C 9.8 (A) 05/18/2019   HGBA1C 9.3 (A) 02/22/2019   HGBA1C 8.2 (A) 10/21/2018   ESRSEDRATE 84 (H) 01/27/2013   ESRSEDRATE 65 (H) 01/26/2013   ESRSEDRATE 15 08/13/2009   CRP 31.2 (H) 01/27/2013   REPTSTATUS 05/13/2019 FINAL 05/11/2019   GRAMSTAIN  01/15/2017    RARE WBC PRESENT,BOTH PMN AND MONONUCLEAR NO ORGANISMS SEEN    CULT (A) 05/11/2019    >=100,000 COLONIES/mL VANCOMYCIN RESISTANT ENTEROCOCCUS ISOLATED   LABORGA VANCOMYCIN RESISTANT ENTEROCOCCUS ISOLATED (A) 05/11/2019     Lab Results  Component Value Date   ALBUMIN 3.1 (L) 05/14/2019   ALBUMIN 3.2 (L) 05/13/2019   ALBUMIN 3.5 05/11/2019    Body mass index is 38.06 kg/m.  Orders:  No orders of the defined types were placed in this encounter.  No orders of the defined types were placed in this encounter.    Procedures: No procedures  performed  Clinical Data: No additional findings.  ROS:  All other systems negative, except as noted in the HPI. Review of Systems  Objective: Vital Signs: Ht 5\' 7"  (1.702 m)   Wt 243 lb (110.2 kg)   BMI 38.06 kg/m   Specialty Comments:  No specialty comments available.  PMFS History: Patient Active Problem List   Diagnosis Date Noted  . Vulvar cysts 01/27/2019  . Urinary incontinence 05/13/2018  . OSA (obstructive sleep apnea) 06/12/2017  . Morbid obesity with BMI of 40.0-44.9, adult ([place]) 03/26/2017  . Toe amputation status, right 01/16/2017  . Thrombocytopenia ([place]) 06/05/2016  . 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 Type 2 diabetes with nephropathy ([place]) 09/07/2015  . Uncontrolled diabetes mellitus with retinopathy, due to underlying  condition, without macular edema ([place]) 09/05/2015  . Diabetic retinopathy ([place]) 09/05/2015  . Counseling regarding end of life decision making 06/14/2015  . Atherosclerosis of aorta ([place]) 04/04/2015  . Anemia 10/05/2014  . Chronic diastolic heart failure ([place])   . Tobacco abuse   . Severe obesity (BMI >= 40) ([place]) 03/02/2013  . Abnormality of gait 03/01/2013  . Healthcare maintenance 07/10/2012  . Chronic prescription opiate use 12/03/2011  . Secondary diabetes mellitus with peripheral vascular disease ([place]) 08/27/2011  . 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 Hypertension associated with diabetes ([place]) 11/29/2009  . Chronic insomnia 10/25/2009  . GASTROESOPHAGEAL REFLUX DISEASE 11/24/2008  . Depression, major, severe recurrence ([place]) 04/06/2008  . DM (diabetes mellitus) type II uncontrolled, periph vascular disorder ([place]) 04/02/2007  . Hyperlipidemia associated with type 2 diabetes mellitus ([place]) 01/08/2007  . COPD (chronic obstructive pulmonary disease) ([place]) 01/08/2007   Past Medical History:  Diagnosis Date  . Anginal pain [facility])    '3' of 10 ischemia ruled out 9/9   . Arthritis of lumbar spine   . Asthma   . Cataract   . CHF (congestive heart failure) ([place])   . 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 Chronic bronchitis ([place])    "I get it alot" (09/28/2013)  . Chronic diastolic heart failure (HCC)    grade 2 per 2D echocardiogram (01/2013)  . Chronic lower back pain   . Chronic osteomyelitis of foot (HCC)    chronic, right secondary to diabetic foot ulcers  . Chronic pain syndrome 12/03/2011   Likely secondary to depression, "fibromyalgia", neuropathy, and obesity. Lumbar MRI 2014 no sig change from prior (2008) : Stable hypertrophic facet disease most notable at L4-5. Stable shallow left foraminal/extraforaminal disc protrusion at L4-5. No direct neural compression.      Marland Kitchen COPD 01/08/2007   PFT's 05/2007 : FEV1/FVC 82, FEV1 64% pred, FEF  25-75% 40% predicted, 16% improvement in FEV1 with bronchodilators.     . Depression   . Diabetes mellitus without complication ([place])    Type II  . Diabetic peripheral neuropathy ([place])   . DVT of upper extremity (deep vein thrombosis) ([place]) 03/11/2013   Secondary to PICC line. Right brachial vein, diagnosed on 03/10/2013 Coumadin for 3 months. End date 06/10/2013   . Dyspnea    "smoker"  . Environmental allergies    Hx: of  . Exertional shortness of breath   . Fatty liver 2003   observed on ultrasound abdomen  . Fibromyalgia   . GERD (gastroesophageal reflux disease)   . Glaucoma   . History of use of hearing aid   . Hyperlipidemia   . Hyperplastic colon polyp 12/2010   Per colonoscopy (12/2010) -  Dr. Deatra Ina  . Hypertension   . 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 Blood cultures 01/26/13 S. Aureus and GBS. Blood cultures Feb 6th, 8th, and 9th and March were negative.Repeat TEE 3/20 negative for vegitations  . 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 2/2 tobacco use, DM, and cont weight bearing on surgical wound and developed gangrene   . Pneumonia   . Polymicrobial bacterial infection 01/2013   GBS and S. aureus bacteremia // Source likely infected diabetic foot ulcer  . PVD (peripheral vascular disease) with claudication ([place])    Stents to bilateral common iliac arteries (left 2005, right 2008), on chronic plavix  . Rheumatoid arthritis ([place])   . S/P BKA (below knee amputation) unilateral [facility])    Oct 2014 L - failed limb preserving treatment. 2/2 tobacco use, DM, and cont weight bearing on surgical wound and developed gangrene   . Tobacco abuse   . Type II diabetes mellitus with peripheral circulatory disorders, uncontrolled DX: 1993   Insulin dep. Poor control. Complicated by diabetic foot ulcer and diabetic eye disease.    Marland Kitchen Ulcer of foot, chronic (HCC)     Left. No OM per MRI (01/2013)    Family History  Problem Relation Age of Onset  . Diverticulosis Mother   . Diabetes Mother   . Hypertension Mother   . Congestive Heart Failure Mother   . Asthma Father   . CAD Sister 26       MI at age 24 per patient.  However, she has not had a stent or CABG.   . Heart disease Sister        before age 56  . Breast cancer Neg Hx     Past Surgical History:  Procedure Laterality Date  . ABDOMINAL HYSTERECTOMY  1997   secondary to uterine fibroids  . AMPUTATION Left 08/31/2013   Procedure: AMPUTATION RAY;  Surgeon: Newt Minion, MD;  Location: [facility];  Service: Orthopedics;  Laterality: Left;  Left Foot 5th Ray Amputation  . AMPUTATION Left 09/28/2013   Procedure: Left Midfoot amputation;  Surgeon: Newt Minion, MD;  Location: [place];  Service: Orthopedics;  Laterality: Left;  Left Midfoot amputation  . AMPUTATION Left 10/14/2013   Procedure: AMPUTATION BELOW KNEE- left;  Surgeon: Newt Minion, MD;  Location: [place];  Service: Orthopedics;  Laterality: Left;  Left Below Knee Amputation   . AMPUTATION TOE Right 01/15/2017   Procedure: AMPUTATION 5th TOE RIGHT FOOT;  Surgeon: Edrick Kins, DPM;  Location: [place];  Service: Podiatry;  Laterality: Right;  . APPLICATION OF WOUND VAC  04/01/2019   Procedure: Application Of Wound Vac;  Surgeon: Newt Minion, MD;  Location: [place];  Service: Orthopedics;;  . BLADDER SURGERY     bladder reconstruction surgery  . BREAST BIOPSY     multiple-benign per pt  . COLONOSCOPY    . ESOPHAGOGASTRODUODENOSCOPY N/A 09/20/2013   Procedure: ESOPHAGOGASTRODUODENOSCOPY (EGD);  Surgeon: Jerene Bears, MD;  Location: [place];  Service: Gastroenterology;  Laterality: N/A;  . FOOT AMPUTATION THROUGH METATARSAL Left 09/28/2013  . GANGLION CYST EXCISION     multiple  . PERIPHERAL VASCULAR INTERVENTION     stents in lower ext  . SHOULDER ARTHROSCOPY W/ ROTATOR CUFF REPAIR Bilateral   . SKIN SPLIT GRAFT Bilateral 05/13/2013     Procedure: Right and Left Foot Allograft Skin  Graft;  Surgeon: Newt Minion, MD;  Location: [facility];  Service: Orthopedics;  Laterality: Bilateral;  Right and Left Foot Allograft Skin Graft  . STUMP REVISION Left 04/01/2019   Procedure: REVISION LEFT BELOW KNEE AMPUTATION;  Surgeon: Newt Minion, MD;  Location: [place];  Service: Orthopedics;  Laterality: Left;  . TEE WITHOUT CARDIOVERSION N/A 01/31/2013   Procedure: TRANSESOPHAGEAL ECHOCARDIOGRAM (TEE);  Surgeon: Fay Records, MD;  Location: [place];  Service: Cardiovascular;  Laterality: N/A;  Rm [phone]  . TEE WITHOUT CARDIOVERSION N/A 03/10/2013   Procedure: TRANSESOPHAGEAL ECHOCARDIOGRAM (TEE);  Surgeon: Larey Dresser, MD;  Location: [place];  Service: Cardiovascular;  Laterality: N/A;  Rm. [phone]  . TOE AMPUTATION Left 08/31/2013   4TH & 5 TH TOE   . TONSILLECTOMY    . TUBAL LIGATION    . WRIST SURGERY Right    "for tumors" (09/28/2013)   Social History   Occupational History  . Occupation: Disability    Comment: previously worked as a Engineer, materials  . Smoking status: Current Every Day Smoker    Packs/day: 1.00    Years: 50.00    Pack years: 50.00    Types: Cigarettes  . Smokeless tobacco: Never Used  . Tobacco comment: 1 PPD  Substance and Sexual Activity  . Alcohol use: No    Alcohol/week: 0.0 standard drinks  . Drug use: No    Types: Marijuana, "Crack" cocaine    Comment: 09/28/2013 "no marijuana since 2011, no crack/cocaine 1989"  . Sexual activity: Not Currently

## 2019-06-22 ENCOUNTER — Ambulatory Visit (INDEPENDENT_AMBULATORY_CARE_PROVIDER_SITE_OTHER): Payer: Medicare Other | Admitting: Internal Medicine

## 2019-06-22 ENCOUNTER — Other Ambulatory Visit: Payer: Self-pay

## 2019-06-22 ENCOUNTER — Encounter: Payer: Self-pay | Admitting: Internal Medicine

## 2019-06-22 ENCOUNTER — Ambulatory Visit: Payer: Medicare Other | Admitting: Dietician

## 2019-06-22 VITALS — BP 132/69 | HR 85 | Temp 98.6°F | Ht 67.0 in | Wt 244.2 lb

## 2019-06-22 DIAGNOSIS — M7541 Impingement syndrome of right shoulder: Secondary | ICD-10-CM

## 2019-06-22 DIAGNOSIS — Z6838 Body mass index (BMI) 38.0-38.9, adult: Secondary | ICD-10-CM

## 2019-06-22 DIAGNOSIS — Z79899 Other long term (current) drug therapy: Secondary | ICD-10-CM

## 2019-06-22 DIAGNOSIS — L97519 Non-pressure chronic ulcer of other part of right foot with unspecified severity: Secondary | ICD-10-CM

## 2019-06-22 DIAGNOSIS — L989 Disorder of the skin and subcutaneous tissue, unspecified: Secondary | ICD-10-CM | POA: Diagnosis not present

## 2019-06-22 DIAGNOSIS — E11621 Type 2 diabetes mellitus with foot ulcer: Secondary | ICD-10-CM

## 2019-06-22 DIAGNOSIS — E1151 Type 2 diabetes mellitus with diabetic peripheral angiopathy without gangrene: Secondary | ICD-10-CM | POA: Diagnosis not present

## 2019-06-22 DIAGNOSIS — Z89512 Acquired absence of left leg below knee: Secondary | ICD-10-CM

## 2019-06-22 DIAGNOSIS — Z794 Long term (current) use of insulin: Secondary | ICD-10-CM

## 2019-06-22 DIAGNOSIS — M25811 Other specified joint disorders, right shoulder: Secondary | ICD-10-CM

## 2019-06-22 DIAGNOSIS — IMO0002 Reserved for concepts with insufficient information to code with codable children: Secondary | ICD-10-CM

## 2019-06-22 LAB — TOXASSURE SELECT,+ANTIDEPR,UR

## 2019-06-22 NOTE — Patient Instructions (Addendum)
Thank you for allowing [place] taking care of you at [facility]. Please avoid drinking soda and come back to clinic in a week for elevation of your glucose number that we are currently monitoring with CGM. Continue great job of taking your medications and injecting Insulin regularly. We refer you to orthopedic surgery for your right shoulder pain.  Someone will call you from their office. Please continue using clotrimazole cream on your arm lesion and come back to clinic if no improvement in 2 weeks. Thank you

## 2019-06-22 NOTE — Assessment & Plan Note (Signed)
Patient present for CGM reports (week 1) BG average 160s, 67% at goal. Elevated blood glucose were mainly at noon-afternoon time. Discussed patient's diet. She endorses that she drink soda with lunch every day. She agrees to quit that and will come back to clinic in a week for reading CGM. If still hyperglycemic, may need to add lunch time Insulin (Recommending 7-9 u Novolog). She agrees with getting personal CGM and probable need to add mealtime Insulin. Examined her right foot. She has superficial non infected ulcer at posterior aspect of her foot. DP pulse is present. Extremity is well perfused. She has been seen by Dr. Sharol Given frequently (weekly). Will follow. -Continue Tresiba 70 u daily, Metformin and Ozempic -Return to clinic in a week for download CGM #2

## 2019-06-22 NOTE — Progress Notes (Signed)
CC: Diabetes follow up  HPI:  Jennifer [name] is a 58 y.o. with PMHx listed below presented for DM follow up. Please refer to problem list for updated assessment and plan.  Past Medical History:  Diagnosis Date  . Anginal pain [facility])    '3' of 10 ischemia ruled out 9/9   . Arthritis of lumbar spine   . Asthma   . Cataract   . CHF (congestive heart failure) ([place])   . 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 Chronic bronchitis ([place])    "I get it alot" (09/28/2013)  . Chronic diastolic heart failure (HCC)    grade 2 per 2D echocardiogram (01/2013)  . Chronic lower back pain   . Chronic osteomyelitis of foot (HCC)    chronic, right secondary to diabetic foot ulcers  . Chronic pain syndrome 12/03/2011   Likely secondary to depression, "fibromyalgia", neuropathy, and obesity. Lumbar MRI 2014 no sig change from prior (2008) : Stable hypertrophic facet disease most notable at L4-5. Stable shallow left foraminal/extraforaminal disc protrusion at L4-5. No direct neural compression.      Marland Kitchen COPD 01/08/2007   PFT's 05/2007 : FEV1/FVC 82, FEV1 64% pred, FEF 25-75% 40% predicted, 16% improvement in FEV1 with bronchodilators.     . Depression   . Diabetes mellitus without complication ([place])    Type II  . Diabetic peripheral neuropathy ([place])   . DVT of upper extremity (deep vein thrombosis) ([place]) 03/11/2013   Secondary to PICC line. Right brachial vein, diagnosed on 03/10/2013 Coumadin for 3 months. End date 06/10/2013   . Dyspnea    "smoker"  . Environmental allergies    Hx: of  . Exertional shortness of breath   . Fatty liver 2003   observed on ultrasound abdomen  . Fibromyalgia   . GERD (gastroesophageal reflux disease)   . Glaucoma   . History of use of hearing aid   . Hyperlipidemia   . Hyperplastic colon polyp 12/2010   Per colonoscopy (12/2010) - Dr. Deatra Ina  . Hypertension   . Infective endocarditis 01/2013   TEE 2/14 : Endocarditis involving mitral and tricuspid  valves. Blood cultures 01/26/13 S. Aureus and GBS. Blood cultures Feb 6th, 8th, and 9th and March were negative.Repeat TEE 3/20 negative for vegitations  . 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 2/2 tobacco use, DM, and cont weight bearing on surgical wound and developed gangrene   . Pneumonia   . Polymicrobial bacterial infection 01/2013   GBS and S. aureus bacteremia // Source likely infected diabetic foot ulcer  . PVD (peripheral vascular disease) with claudication ([place])    Stents to bilateral common iliac arteries (left 2005, right 2008), on chronic plavix  . Rheumatoid arthritis ([place])   . S/P BKA (below knee amputation) unilateral [facility])    Oct 2014 L - failed limb preserving treatment. 2/2 tobacco use, DM, and cont weight bearing on surgical wound and developed gangrene   . Tobacco abuse   . Type II diabetes mellitus with peripheral circulatory disorders, uncontrolled DX: 1993   Insulin dep. Poor control. Complicated by diabetic foot ulcer and diabetic eye disease.    Marland Kitchen Ulcer of foot, chronic (HCC)    Left. No OM per MRI (01/2013)   Review of Systems:  Review of Systems  Cardiovascular: Negative for chest pain.  Skin: Positive for itching.  Neurological: Negative for dizziness and headaches.  Physical Exam:   There were no vitals filed for this visit. Physical Exam  Constitutional: She is oriented to person, place, and time. No distress.  Morbidly obes, no acute distress, on wheelchair, left BKA  Cardiovascular: Normal rate, regular rhythm and normal heart sounds.  Pulmonary/Chest: Effort normal. No respiratory distress. She has no wheezes. She has no rales.  Abdominal: Soft. She exhibits no distension. There is no abdominal tenderness.  Musculoskeletal:        General: Tenderness and edema present.     Comments: Right shoulder: ROM is painful, especially with internal rotation  Neurological: She is alert  and oriented to person, place, and time.  Psychiatric: Mood, affect and judgment normal.  Nursing note and vitals reviewed.   Assessment & Plan:   See Encounters Tab for problem based charting.  Patient  seen with Dr. Angelia Mould

## 2019-06-22 NOTE — Assessment & Plan Note (Deleted)
Patient present for CGM reports (week 1) BG average 160s, 67% at goal. Elevated blood glucose were mainly at noon-afternoon time. Discussed patient's diet. She endorses that she drink soda with lunch every day. She agrees to quit that and will come back to clinic in a week for reading CGM. If still hyperglycemic, may need to add lunch time Insulin (Recommending 7-9 u Novolog). She agrees with getting personal CGM and probable need to add mealtime Insulin. -Continue Tresiba 70 u daily, Metformin and Ozempic -Return to clinic in a week for CGM reading

## 2019-06-22 NOTE — Assessment & Plan Note (Signed)
Patient reports rt shoulder pain. Exam with painful ROM mostly with flexion and internal rotation. POC [place] is suggestive of impingement syndrome. Patient is not interested in steroid injection today, ortho referral placed.

## 2019-06-23 ENCOUNTER — Telehealth: Payer: Self-pay

## 2019-06-24 DIAGNOSIS — M797 Fibromyalgia: Secondary | ICD-10-CM | POA: Diagnosis not present

## 2019-06-24 DIAGNOSIS — H42 Glaucoma in diseases classified elsewhere: Secondary | ICD-10-CM | POA: Diagnosis not present

## 2019-06-24 DIAGNOSIS — I700 Atherosclerosis of aorta: Secondary | ICD-10-CM | POA: Diagnosis not present

## 2019-06-24 DIAGNOSIS — Z9181 History of falling: Secondary | ICD-10-CM | POA: Diagnosis not present

## 2019-06-24 DIAGNOSIS — E1151 Type 2 diabetes mellitus with diabetic peripheral angiopathy without gangrene: Secondary | ICD-10-CM | POA: Diagnosis not present

## 2019-06-24 DIAGNOSIS — J449 Chronic obstructive pulmonary disease, unspecified: Secondary | ICD-10-CM | POA: Diagnosis not present

## 2019-06-24 DIAGNOSIS — E1121 Type 2 diabetes mellitus with diabetic nephropathy: Secondary | ICD-10-CM | POA: Diagnosis not present

## 2019-06-24 DIAGNOSIS — T879 Unspecified complications of amputation stump: Secondary | ICD-10-CM | POA: Diagnosis not present

## 2019-06-24 DIAGNOSIS — E1140 Type 2 diabetes mellitus with diabetic neuropathy, unspecified: Secondary | ICD-10-CM | POA: Diagnosis not present

## 2019-06-24 DIAGNOSIS — Z89512 Acquired absence of left leg below knee: Secondary | ICD-10-CM | POA: Diagnosis not present

## 2019-06-24 DIAGNOSIS — I152 Hypertension secondary to endocrine disorders: Secondary | ICD-10-CM | POA: Diagnosis not present

## 2019-06-24 DIAGNOSIS — E11319 Type 2 diabetes mellitus with unspecified diabetic retinopathy without macular edema: Secondary | ICD-10-CM | POA: Diagnosis not present

## 2019-06-24 DIAGNOSIS — Z794 Long term (current) use of insulin: Secondary | ICD-10-CM | POA: Diagnosis not present

## 2019-06-24 DIAGNOSIS — E1139 Type 2 diabetes mellitus with other diabetic ophthalmic complication: Secondary | ICD-10-CM | POA: Diagnosis not present

## 2019-06-24 DIAGNOSIS — E1165 Type 2 diabetes mellitus with hyperglycemia: Secondary | ICD-10-CM | POA: Diagnosis not present

## 2019-06-24 DIAGNOSIS — G4733 Obstructive sleep apnea (adult) (pediatric): Secondary | ICD-10-CM | POA: Diagnosis not present

## 2019-06-24 DIAGNOSIS — M4696 Unspecified inflammatory spondylopathy, lumbar region: Secondary | ICD-10-CM | POA: Diagnosis not present

## 2019-06-24 DIAGNOSIS — G894 Chronic pain syndrome: Secondary | ICD-10-CM | POA: Diagnosis not present

## 2019-06-24 DIAGNOSIS — T8781 Dehiscence of amputation stump: Secondary | ICD-10-CM | POA: Diagnosis not present

## 2019-06-24 DIAGNOSIS — Z89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): Secondary | ICD-10-CM | POA: Diagnosis not present

## 2019-06-24 DIAGNOSIS — I5032 Chronic diastolic (congestive) heart failure: Secondary | ICD-10-CM | POA: Diagnosis not present

## 2019-06-28 ENCOUNTER — Telehealth: Payer: Self-pay | Admitting: Dietician

## 2019-06-28 DIAGNOSIS — I152 Hypertension secondary to endocrine disorders: Secondary | ICD-10-CM | POA: Diagnosis not present

## 2019-06-28 DIAGNOSIS — E11319 Type 2 diabetes mellitus with unspecified diabetic retinopathy without macular edema: Secondary | ICD-10-CM | POA: Diagnosis not present

## 2019-06-28 DIAGNOSIS — Z794 Long term (current) use of insulin: Secondary | ICD-10-CM | POA: Diagnosis not present

## 2019-06-28 DIAGNOSIS — I700 Atherosclerosis of aorta: Secondary | ICD-10-CM | POA: Diagnosis not present

## 2019-06-28 DIAGNOSIS — I5032 Chronic diastolic (congestive) heart failure: Secondary | ICD-10-CM | POA: Diagnosis not present

## 2019-06-28 DIAGNOSIS — Z9181 History of falling: Secondary | ICD-10-CM | POA: Diagnosis not present

## 2019-06-28 DIAGNOSIS — E1165 Type 2 diabetes mellitus with hyperglycemia: Secondary | ICD-10-CM | POA: Diagnosis not present

## 2019-06-28 DIAGNOSIS — G894 Chronic pain syndrome: Secondary | ICD-10-CM | POA: Diagnosis not present

## 2019-06-28 DIAGNOSIS — Z89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): Secondary | ICD-10-CM | POA: Diagnosis not present

## 2019-06-28 DIAGNOSIS — G4733 Obstructive sleep apnea (adult) (pediatric): Secondary | ICD-10-CM | POA: Diagnosis not present

## 2019-06-28 DIAGNOSIS — T8781 Dehiscence of amputation stump: Secondary | ICD-10-CM | POA: Diagnosis not present

## 2019-06-28 DIAGNOSIS — M4696 Unspecified inflammatory spondylopathy, lumbar region: Secondary | ICD-10-CM | POA: Diagnosis not present

## 2019-06-28 DIAGNOSIS — H42 Glaucoma in diseases classified elsewhere: Secondary | ICD-10-CM | POA: Diagnosis not present

## 2019-06-28 DIAGNOSIS — E1140 Type 2 diabetes mellitus with diabetic neuropathy, unspecified: Secondary | ICD-10-CM | POA: Diagnosis not present

## 2019-06-28 DIAGNOSIS — E1151 Type 2 diabetes mellitus with diabetic peripheral angiopathy without gangrene: Secondary | ICD-10-CM | POA: Diagnosis not present

## 2019-06-28 DIAGNOSIS — M797 Fibromyalgia: Secondary | ICD-10-CM | POA: Diagnosis not present

## 2019-06-28 DIAGNOSIS — Z89512 Acquired absence of left leg below knee: Secondary | ICD-10-CM | POA: Diagnosis not present

## 2019-06-28 DIAGNOSIS — J449 Chronic obstructive pulmonary disease, unspecified: Secondary | ICD-10-CM | POA: Diagnosis not present

## 2019-06-28 DIAGNOSIS — E1121 Type 2 diabetes mellitus with diabetic nephropathy: Secondary | ICD-10-CM | POA: Diagnosis not present

## 2019-06-28 DIAGNOSIS — E1139 Type 2 diabetes mellitus with other diabetic ophthalmic complication: Secondary | ICD-10-CM | POA: Diagnosis not present

## 2019-06-28 DIAGNOSIS — R300 Dysuria: Secondary | ICD-10-CM

## 2019-06-28 DIAGNOSIS — T879 Unspecified complications of amputation stump: Secondary | ICD-10-CM | POA: Diagnosis not present

## 2019-06-28 NOTE — Telephone Encounter (Signed)
Called to schedule an appointment for CGM download 2.

## 2019-06-29 DIAGNOSIS — Z89512 Acquired absence of left leg below knee: Secondary | ICD-10-CM | POA: Diagnosis not present

## 2019-06-29 DIAGNOSIS — H42 Glaucoma in diseases classified elsewhere: Secondary | ICD-10-CM | POA: Diagnosis not present

## 2019-06-29 DIAGNOSIS — M4696 Unspecified inflammatory spondylopathy, lumbar region: Secondary | ICD-10-CM | POA: Diagnosis not present

## 2019-06-29 DIAGNOSIS — Z794 Long term (current) use of insulin: Secondary | ICD-10-CM | POA: Diagnosis not present

## 2019-06-29 DIAGNOSIS — E1151 Type 2 diabetes mellitus with diabetic peripheral angiopathy without gangrene: Secondary | ICD-10-CM | POA: Diagnosis not present

## 2019-06-29 DIAGNOSIS — E1165 Type 2 diabetes mellitus with hyperglycemia: Secondary | ICD-10-CM | POA: Diagnosis not present

## 2019-06-29 DIAGNOSIS — E11319 Type 2 diabetes mellitus with unspecified diabetic retinopathy without macular edema: Secondary | ICD-10-CM | POA: Diagnosis not present

## 2019-06-29 DIAGNOSIS — G4733 Obstructive sleep apnea (adult) (pediatric): Secondary | ICD-10-CM | POA: Diagnosis not present

## 2019-06-29 DIAGNOSIS — J449 Chronic obstructive pulmonary disease, unspecified: Secondary | ICD-10-CM | POA: Diagnosis not present

## 2019-06-29 DIAGNOSIS — E1121 Type 2 diabetes mellitus with diabetic nephropathy: Secondary | ICD-10-CM | POA: Diagnosis not present

## 2019-06-29 DIAGNOSIS — I5032 Chronic diastolic (congestive) heart failure: Secondary | ICD-10-CM | POA: Diagnosis not present

## 2019-06-29 DIAGNOSIS — T879 Unspecified complications of amputation stump: Secondary | ICD-10-CM | POA: Diagnosis not present

## 2019-06-29 DIAGNOSIS — Z9181 History of falling: Secondary | ICD-10-CM | POA: Diagnosis not present

## 2019-06-29 DIAGNOSIS — E1139 Type 2 diabetes mellitus with other diabetic ophthalmic complication: Secondary | ICD-10-CM | POA: Diagnosis not present

## 2019-06-29 DIAGNOSIS — I152 Hypertension secondary to endocrine disorders: Secondary | ICD-10-CM | POA: Diagnosis not present

## 2019-06-29 DIAGNOSIS — I700 Atherosclerosis of aorta: Secondary | ICD-10-CM | POA: Diagnosis not present

## 2019-06-29 DIAGNOSIS — Z89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): Secondary | ICD-10-CM | POA: Diagnosis not present

## 2019-06-29 DIAGNOSIS — G894 Chronic pain syndrome: Secondary | ICD-10-CM | POA: Diagnosis not present

## 2019-06-29 DIAGNOSIS — E1140 Type 2 diabetes mellitus with diabetic neuropathy, unspecified: Secondary | ICD-10-CM | POA: Diagnosis not present

## 2019-06-29 DIAGNOSIS — T8781 Dehiscence of amputation stump: Secondary | ICD-10-CM | POA: Diagnosis not present

## 2019-06-29 DIAGNOSIS — M797 Fibromyalgia: Secondary | ICD-10-CM | POA: Diagnosis not present

## 2019-06-29 NOTE — Progress Notes (Signed)
Internal Medicine Clinic Attending  I saw and evaluated the patient.  I personally confirmed the key portions of the history and exam documented by Dr. Myrtie Hawk and I reviewed pertinent patient test results.  The assessment, diagnosis, and plan were formulated together and I agree with the documentation in the resident's note.    As noted exam is somewhat limited by pain, does not allow for abduction past 90 degrees, limited POCUS also preformed does show some evidence of impingement, Offered steroid injection inoffice today versus referral to orthopedics. She opted for referral.

## 2019-06-29 NOTE — Telephone Encounter (Signed)
Would like to see a doctor when she comes for CGM download tomorrow afternoon because she says she has UTI symptoms despite medicine and  It has been going on for months now. I told her I would route this message to the triage nurse to see what appointments are available.

## 2019-06-29 NOTE — Addendum Note (Signed)
Addended by: Larey Dresser A on: 06/29/2019 04:02 PM   Modules accepted: Orders

## 2019-06-29 NOTE — Telephone Encounter (Signed)
Her urine culture on June 25 was negative.  She has been on Bactrim for some time for a wound infection and she states her urologist gave her a different antibiotic at the end of June for a UTI.  I cannot prescribe her anything empirically because of all of her antibiotic use.  I would need a positive culture result or have her admitted and on IV antibiotics because at this point, she likely has resistance if she truly did have a UTI.  I think she does need an appointment and possible consideration of other etiologies like a GYN infection and a repeat UA and culture.

## 2019-06-29 NOTE — Telephone Encounter (Signed)
Dr Software engineer, spoke w/ pt, she states she needs to be seen because she knows she has a UTI, burning, urgency, pain. States she would like an appt for tomorrow, [facility] booked at 100%, please advise, at the end of this conversation she states she will call dr bell's office- [facility] but she wants note to dr butcher because she feels bad. Could you please send her something for uti?

## 2019-06-29 NOTE — Telephone Encounter (Signed)
RTC to patient, she states "I am shaking all over when I urinate and I know I have a UTI because that is what happens when I have a UTI".  RN asked if she is shaking because she is in pain when urinating and pt states "yes".  Pt states this has been going on for months.  Pt notified there are no openings tomorrow in [facility] or Friday morning.  Appt offered for Monday, pt agrees, appt made for 7/13 @ 3:15.  Pt also instructed when she presents for [facility] download tomorrow to check and see if there were any cancellations in clinic, she verbalized understanding.  Will forward to PCP. SChaplin, RN,BSN

## 2019-06-30 ENCOUNTER — Ambulatory Visit: Payer: Medicare Other | Admitting: Dietician

## 2019-06-30 ENCOUNTER — Ambulatory Visit (INDEPENDENT_AMBULATORY_CARE_PROVIDER_SITE_OTHER): Payer: Medicare Other | Admitting: Internal Medicine

## 2019-06-30 ENCOUNTER — Encounter: Payer: Self-pay | Admitting: Dietician

## 2019-06-30 ENCOUNTER — Other Ambulatory Visit: Payer: Self-pay | Admitting: Internal Medicine

## 2019-06-30 ENCOUNTER — Other Ambulatory Visit: Payer: Medicare Other

## 2019-06-30 ENCOUNTER — Other Ambulatory Visit: Payer: Self-pay

## 2019-06-30 ENCOUNTER — Encounter: Payer: Self-pay | Admitting: Internal Medicine

## 2019-06-30 VITALS — BP 126/62 | HR 84 | Temp 98.3°F | Wt 245.5 lb

## 2019-06-30 DIAGNOSIS — R300 Dysuria: Secondary | ICD-10-CM

## 2019-06-30 DIAGNOSIS — Z79899 Other long term (current) drug therapy: Secondary | ICD-10-CM | POA: Diagnosis not present

## 2019-06-30 DIAGNOSIS — N3941 Urge incontinence: Secondary | ICD-10-CM

## 2019-06-30 DIAGNOSIS — L03211 Cellulitis of face: Secondary | ICD-10-CM | POA: Insufficient documentation

## 2019-06-30 DIAGNOSIS — R32 Unspecified urinary incontinence: Secondary | ICD-10-CM

## 2019-06-30 DIAGNOSIS — R21 Rash and other nonspecific skin eruption: Secondary | ICD-10-CM | POA: Diagnosis not present

## 2019-06-30 MED ORDER — SOLIFENACIN SUCCINATE 5 MG PO TABS: 5.0000 mg | ORAL_TABLET | Freq: Every day | ORAL | 2 refills | Status: DC

## 2019-06-30 MED ORDER — PERMETHRIN 1 % EX LOTN: TOPICAL_LOTION | CUTANEOUS | 2 refills | Status: DC

## 2019-06-30 NOTE — Assessment & Plan Note (Signed)
Patient reports continued dysuria and increased urinary frequency and urgency. This is a chronic issue and she is following her urologist and Dr. [name] for this. She reports chronic antibiotic use but does not recall the name of the antibiotics. She reports compliance with Solifenacin and Myrbetriq as prescribed by urologist and PCP. She is taking Bactrim as well. Urine was collected today for culture and will follow up on results.   - Follow up results of urine culture - Refill Solifenacin 5mg  qd  - Follow up with urologist and PCP

## 2019-06-30 NOTE — Assessment & Plan Note (Addendum)
Patient continues to experience skin rash on bilateral hands and forearms. She was prescribed ciclopirox 0.77% at last visit. However, patient reports worsened itchiness. On examination, there are multiple diffusely scattered hyperpigmented lesions with excoriations on bilateral arms and hands. Lesions in the webbing of fingers likely secondary to scabies.   - Permethrin 1% lotion qd - Follow up with PCP

## 2019-06-30 NOTE — Patient Instructions (Signed)
Ms. [place],  It was a pleasure seeing you in clinic today. Today we discussed your right cheek swelling and pain, skin rash and your urinary symptoms.  - We believe your skin rash is from scabies. We have prescribed permethrin. Please apply this to your rash. Please wash your clothes and sheets at this time.  - For your urinary symptoms, we are going to follow up with your urine culture and notify you of the results. At this time, we are refilling your solfenacin. Please follow up with your PCP and urologist for symptom management. - For your right cheek swelling and pain, please continue to monitor the symptoms. As we don't see any signs of ear infection, sore throat or dental infection, we will continue to monitor. Please contact us if your symptoms worsen.

## 2019-06-30 NOTE — Progress Notes (Signed)
CC: right cheek pain and urinary symptoms   HPI:  [name] is a 58 y.o. female with PMHx as listed below presents for right cheek swelling and pain. Patient reports pain and itching on right cheek since last night. Patient denies trauma to the area. She denies any sore throat, ear pain, fever/chills, or recent sick contacts.  Patient reports continued dysuria and increased urinary frequency since March 2020. She has been taking Myrbetriq and Solifenacin. She reports that her nursing facility had given her antibiotics and she has been taking Bactrim since June 2020, neither of which have provided relief. Patient denies hematuria or flank pain at this time.    Past Medical History:  Diagnosis Date  . Anginal pain [facility])    '3' of 10 ischemia ruled out 9/9   . Arthritis of lumbar spine   . Asthma   . Cataract   . CHF (congestive heart failure) ([place])   . 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 Chronic bronchitis ([place])    "I get it alot" (09/28/2013)  . Chronic diastolic heart failure (HCC)    grade 2 per 2D echocardiogram (01/2013)  . Chronic lower back pain   . Chronic osteomyelitis of foot (HCC)    chronic, right secondary to diabetic foot ulcers  . Chronic pain syndrome 12/03/2011   Likely secondary to depression, "fibromyalgia", neuropathy, and obesity. Lumbar MRI 2014 no sig change from prior (2008) : Stable hypertrophic facet disease most notable at L4-5. Stable shallow left foraminal/extraforaminal disc protrusion at L4-5. No direct neural compression.      Marland Kitchen COPD 01/08/2007   PFT's 05/2007 : FEV1/FVC 82, FEV1 64% pred, FEF 25-75% 40% predicted, 16% improvement in FEV1 with bronchodilators.     . Depression   . Diabetes mellitus without complication ([place])    Type II  . Diabetic peripheral neuropathy ([place])   . DVT of upper extremity (deep vein thrombosis) ([place]) 03/11/2013   Secondary to PICC line. Right brachial vein, diagnosed on 03/10/2013 Coumadin for 3  months. End date 06/10/2013   . Dyspnea    "smoker"  . Environmental allergies    Hx: of  . Exertional shortness of breath   . Fatty liver 2003   observed on ultrasound abdomen  . Fibromyalgia   . GERD (gastroesophageal reflux disease)   . Glaucoma   . History of use of hearing aid   . Hyperlipidemia   . Hyperplastic colon polyp 12/2010   Per colonoscopy (12/2010) - Dr. Deatra Ina  . Hypertension   . 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 Blood cultures 01/26/13 S. Aureus and GBS. Blood cultures Feb 6th, 8th, and 9th and March were negative.Repeat TEE 3/20 negative for vegitations  . 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 2/2 tobacco use, DM, and cont weight bearing on surgical wound and developed gangrene   . Pneumonia   . Polymicrobial bacterial infection 01/2013   GBS and S. aureus bacteremia // Source likely infected diabetic foot ulcer  . PVD (peripheral vascular disease) with claudication ([place])    Stents to bilateral common iliac arteries (left 2005, right 2008), on chronic plavix  . Rheumatoid arthritis ([place])   . S/P BKA (below knee amputation) unilateral [facility])    Oct 2014 L - failed limb preserving treatment. 2/2 tobacco use, DM, and cont weight bearing on surgical wound and  developed gangrene   . Tobacco abuse   . Type II diabetes mellitus with peripheral circulatory disorders, uncontrolled DX: 1993   Insulin dep. Poor control. Complicated by diabetic foot ulcer and diabetic eye disease.    Marland Kitchen Ulcer of foot, chronic (HCC)    Left. No OM per MRI (01/2013)   Review of Systems:  Review of Systems  Constitutional: Negative for chills, fever and malaise/fatigue.  HENT: Negative for congestion, ear pain, sinus pain and sore throat.   Respiratory: Negative for cough and shortness of breath.   Cardiovascular: Negative for chest pain and palpitations.    Gastrointestinal: Negative for abdominal pain, nausea and vomiting.  Genitourinary: Positive for dysuria, frequency and urgency. Negative for flank pain and hematuria.  Musculoskeletal: Negative for myalgias.  Skin: Positive for rash.       Rash on bilateral upper extremities  Neurological: Negative for dizziness, sensory change and headaches.  Endo/Heme/Allergies: Does not bruise/bleed easily.     Physical Exam:  There were no vitals filed for this visit. Physical Exam  Constitutional: She is oriented to person, place, and time and well-developed, well-nourished, and in no distress.  HENT:  Head: Normocephalic and atraumatic.  Right Ear: Hearing, tympanic membrane, external ear and ear canal normal. No drainage, swelling or tenderness.  Left Ear: External ear normal.  Mouth/Throat: Uvula is midline, oropharynx is clear and moist and mucous membranes are normal. No oral lesions. Normal dentition. No dental abscesses or dental caries. No oropharyngeal exudate.  Swelling on right side of face. Tender preauricular lymphadenopathy.    Neck: Normal range of motion. Neck supple.  Cardiovascular: Normal rate, regular rhythm and normal heart sounds.  Pulmonary/Chest: Effort normal and breath sounds normal. No respiratory distress.  Neurological: She is alert and oriented to person, place, and time.  Skin: Skin is warm and dry. Rash noted.  Diffusely scattered hyperpigmented lesions with excoriation on bilateral arms and hands. Lesion in the webbing between fingers of left hand, on dorsum aspect of right hand, and extending up to forearms. Lesion on right forearm is the largest.      Assessment & Plan:   See Encounters Tab for problem based charting.  Patient seen with Dr. Evette Doffing

## 2019-06-30 NOTE — Progress Notes (Signed)
Cgm download #2- Jennifer [name] is here for removal and download of her professional cgm sensor. She informs me that no changes were made to her diabetes regimen last week. She was supposed to stop drinking regular soda and consider mealtime insulin if blood sugars are still high.  She reprots that she  increased her insulin to 120 units daily a few weeks ago, has an ongoing UTI for the past few months and a new swelling by her ear.  Diabetes medince- 120 units tresiba per day reported- 70 units per day on her med list,  500 mg metformin xr daily, 0.5 mg ozempic on wednesdays  CGM Results from Download #2 Average is  199 for 15 days Glucose management indicator 8.1% Time in range (70-180 mg/dL): 41 % (Goal >70%) Time high (181-250 mg/dL) 40% (Goal < 25%) Time very high (>250 mg/dl) 19% (Goal < 5%) Time low (54-69 mg/dL): 0% (Goal is <4%) Time very Low (<54) 0%  (Goal <1%) Coefficient of variation:31.3% (Goal is <36%)  Her first week of CGM blood sugars were in the 131/157/166/161/179/177/177/179, week 2 averages were 226/201/200/235/277/286/232. Her values above target in week 1 were primarily in the afternoon between 12 noon and 10 PM. She reports still struggling with drinking large amounts of regular sodas during this time. We could not come up with why her blood sugars had increased the second week. It doe snot sound like she cit back on the sodas. I do not recommend any change in her medication amounts, but if she is taking 120 units of u200 tresiba per day she needs a new prescription and may benefit form taking this amount in two injections. Marland Kitchen at this time as her first week averages were close to target range. She denies running out of any of her diabetes medicine or not taking any. Plan- 4 month post program diabetes self management follow up Jennifer [name], [name] 06/30/2019 5:06 PM.

## 2019-06-30 NOTE — Addendum Note (Signed)
Addended by: Truddie Crumble on: 06/30/2019 02:08 PM   Modules accepted: Orders

## 2019-06-30 NOTE — Addendum Note (Signed)
Addended by: Harvie Heck on: 06/30/2019 04:40 PM   Modules accepted: Orders

## 2019-06-30 NOTE — Assessment & Plan Note (Signed)
Patient presented with right cheek swelling and pain for one day. She denies trauma to the area, tooth ache, recent dental procedure, sore throat, ear pain, fever/chills, cough, headache or recent ill contacts. On examination, right cheek appears to be swollen and there is right preauricular tender lymphadenopathy. Oropharyngeal and ear exam benign. Sinuses nontender to palpation. No lymphadenopathy of other periauricular and cervical lymph nodes.   - Continue to monitor

## 2019-07-01 ENCOUNTER — Telehealth: Payer: Self-pay | Admitting: Internal Medicine

## 2019-07-01 NOTE — Telephone Encounter (Signed)
Returned call to patient. Patient is very confused as to why she was prescribed this, states, "I've's never had lice in my life. Explained this was for scabies and would help with the itchy rash on hands and arms. Patient remains confused. Attending recommends PCP give patient a call. [name], RN, BSN

## 2019-07-01 NOTE — Telephone Encounter (Signed)
Jennifer [name] is a 58 y.o. female reports to clinical pharmacist appointment for COPD medication management.  Patient's current COPD medication regimen consists of: ProAir as needed, Anoro once daily, patient correctly reports how to use each inhaler   Patient reports COPD related symptoms. Symptoms include shortness of breath, coughing, and wheezing - not an everyday thing.  Patient reports use of rescue inhaler with an estimated use at about as needed - average 3-4 times a week.  Patient denies occurrence of a COPD exacerbation since last appointment - but her appointment was just yesterday.  Patient reports currently smoking 1 pack a day - not interested in smoking cessation.  Allergies  Allergen Reactions  . Iohexol Other (See Comments)     Desc: IV CONTRAST CAUSE NEPHROPATHY IN 2007   . Ivp Dye [Iodinated Diagnostic Agents] Other (See Comments)    Desc: IV CONTRAST CAUSE NEPHROPATHY IN 2007  . Abilify [Aripiprazole] Other (See Comments)    Urinary freq Nov 2016  . Morphine Sulfate Itching and Rash    Current Outpatient Medications:  .  albuterol (PROVENTIL HFA;VENTOLIN HFA) 108 (90 Base) MCG/ACT inhaler, Inhale 1-2 puffs into the lungs every 4 (four) hours as needed for wheezing or shortness of breath., Disp: , Rfl:  .  albuterol (PROVENTIL) (2.5 MG/3ML) 0.083% nebulizer solution, Take 3 mLs (2.5 mg total) by nebulization every 6 (six) hours as needed for wheezing., Disp: , Rfl:  .  amLODipine-benazepril (LOTREL) 10-40 MG capsule, TAKE 1 CAPSULE BY MOUTH EVERY DAY, Disp: 90 capsule, Rfl: 3 .  ANORO ELLIPTA 62.5-25 MCG/INH AEPB, TAKE 1 PUFF BY MOUTH EVERY DAY, Disp: 60 each, Rfl: 3 .  atenolol (TENORMIN) 100 MG tablet, TAKE 1 TABLET BY MOUTH EVERY DAY (Patient taking differently: Take 100 mg by mouth daily. ), Disp: 90 tablet, Rfl: 2 .  betamethasone dipropionate (DIPROLENE) 0.05 % cream, Apply 1 application topically 2 (two) times daily as needed (irritation)., Disp: , Rfl:  .   buPROPion (WELLBUTRIN XL) 300 MG 24 hr tablet, TAKE 1 TABLET BY MOUTH DAILY (Patient taking differently: Take 300 mg by mouth daily. ), Disp: 90 tablet, Rfl: 3 .  ciclopirox (LOPROX) 0.77 % cream, Apply topically 2 (two) times daily. Call me if no better in 3 weeks, Disp: 30 g, Rfl: 0 .  DULoxetine (CYMBALTA) 30 MG capsule, TAKE 1 CAPSULE BY MOUTH EVERY DAY (Patient taking differently: Take 30 mg by mouth daily. ), Disp: 90 capsule, Rfl: 3 .  fluticasone (FLONASE) 50 MCG/ACT nasal spray, INSTILL 1 SPRAY IN EACH NOSTRIL DAILY, Disp: 48 g, Rfl: 3 .  furosemide (LASIX) 40 MG tablet, TAKE ONE TABLET BY MOUTH DAILY AS NEEDED, Disp: 90 tablet, Rfl: 5 .  Insulin Degludec (TRESIBA FLEXTOUCH) 200 UNIT/ML SOPN, Inject 70 Units into the skin daily., Disp: 100 mL, Rfl: 2 .  ipratropium (ATROVENT) 0.02 % nebulizer solution, USE 1 VIAL VIA NEBULIZER EVERY 6 HOURS AS NEEDED FOR WHEEZING (Patient taking differently: Take 0.5 mg by nebulization every 6 (six) hours as needed for wheezing. ), Disp: 62.5 mL, Rfl: 3 .  metFORMIN (GLUCOPHAGE-XR) 500 MG 24 hr tablet, TAKE 1 TABLET BY MOUTH EVERY DAY WITH BREAKFAST (Patient taking differently: Take 500 mg by mouth daily with breakfast. ), Disp: 270 tablet, Rfl: 3 .  mirabegron ER (MYRBETRIQ) 50 MG TB24 tablet, Take 1 tablet (50 mg total) by mouth daily., Disp: 90 tablet, Rfl: 3 .  NYSTATIN powder, APPLY TO AFFECTED AREA THREE TIMES A DAY, Disp: 60 g, Rfl:  0 .  omeprazole (PRILOSEC) 20 MG capsule, TAKE 1 CAPSULE (20 MG TOTAL) BY MOUTH 2 (TWO) TIMES DAILY BEFORE A MEAL., Disp: 180 capsule, Rfl: 3 .  permethrin (ELIMITE) 1 % lotion, Apply to affected area daily, Disp: 60 mL, Rfl: 2 .  pregabalin (LYRICA) 300 MG capsule, Take 300 mg by mouth 3 (three) times daily., Disp: , Rfl:  .  rosuvastatin (CRESTOR) 20 MG tablet, TAKE 1 TABLET BY MOUTH AT BEDTIME (Patient taking differently: Take 20 mg by mouth daily. ), Disp: 90 tablet, Rfl: 3 .  silver sulfADIAZINE (SILVADENE) 1 % cream,  Apply 1 application topically daily. To right foot daily, Disp: 50 g, Rfl: 0 .  solifenacin (VESICARE) 5 MG tablet, Take 1 tablet (5 mg total) by mouth daily., Disp: 30 tablet, Rfl: 2 .  sulfamethoxazole-trimethoprim (BACTRIM DS) 800-160 MG tablet, Take 1 tablet by mouth 2 (two) times daily., Disp: 28 tablet, Rfl: 0 Past Medical History:  Diagnosis Date  . Anginal pain [facility])    '3' of 10 ischemia ruled out 9/9   . Arthritis of lumbar spine   . Asthma   . Cataract   . CHF (congestive heart failure) ([place])   . 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 Chronic bronchitis ([place])    "I get it alot" (09/28/2013)  . Chronic diastolic heart failure (HCC)    grade 2 per 2D echocardiogram (01/2013)  . Chronic lower back pain   . Chronic osteomyelitis of foot (HCC)    chronic, right secondary to diabetic foot ulcers  . Chronic pain syndrome 12/03/2011   Likely secondary to depression, "fibromyalgia", neuropathy, and obesity. Lumbar MRI 2014 no sig change from prior (2008) : Stable hypertrophic facet disease most notable at L4-5. Stable shallow left foraminal/extraforaminal disc protrusion at L4-5. No direct neural compression.      Marland Kitchen COPD 01/08/2007   PFT's 05/2007 : FEV1/FVC 82, FEV1 64% pred, FEF 25-75% 40% predicted, 16% improvement in FEV1 with bronchodilators.     . Depression   . Diabetes mellitus without complication ([place])    Type II  . Diabetic peripheral neuropathy ([place])   . DVT of upper extremity (deep vein thrombosis) ([place]) 03/11/2013   Secondary to PICC line. Right brachial vein, diagnosed on 03/10/2013 Coumadin for 3 months. End date 06/10/2013   . Dyspnea    "smoker"  . Environmental allergies    Hx: of  . Exertional shortness of breath   . Fatty liver 2003   observed on ultrasound abdomen  . Fibromyalgia   . GERD (gastroesophageal reflux disease)   . Glaucoma   . History of use of hearing aid   . Hyperlipidemia   . Hyperplastic colon polyp 12/2010   Per colonoscopy  (12/2010) - Dr. Deatra Ina  . Hypertension   . 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 Blood cultures 01/26/13 S. Aureus and GBS. Blood cultures Feb 6th, 8th, and 9th and March were negative.Repeat TEE 3/20 negative for vegitations  . 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 2/2 tobacco use, DM, and cont weight bearing on surgical wound and developed gangrene   . Pneumonia   . Polymicrobial bacterial infection 01/2013   GBS and S. aureus bacteremia // Source likely infected diabetic foot ulcer  . PVD (peripheral vascular disease) with claudication ([place])    Stents to bilateral common iliac arteries (left 2005,  right 04-12-07), on chronic plavix  . Rheumatoid arthritis ([place])   . S/P BKA (below knee amputation) unilateral [facility])    Oct 2014 L - failed limb preserving treatment. 2/2 tobacco use, DM, and cont weight bearing on surgical wound and developed gangrene   . Tobacco abuse   . Type II diabetes mellitus with peripheral circulatory disorders, uncontrolled DX: 1993   Insulin dep. Poor control. Complicated by diabetic foot ulcer and diabetic eye disease.    Marland Kitchen Ulcer of foot, chronic (HCC)    Left. No OM per MRI (01/2013)   Social History   Socioeconomic History  . Marital status: Divorced    Spouse name: Not on file  . Number of children: 2  . Years of education: college  . Highest education level: Not on file  Occupational History  . Occupation: Disability    Comment: previously worked as a Civil engineer, contracting  . Financial resource strain: Very hard  . Food insecurity    Worry: Often true    Inability: Often true  . Transportation needs    Medical: No    Non-medical: Yes  Tobacco Use  . Smoking status: Current Every Day Smoker    Packs/day: 1.00    Years: 50.00    Pack years: 50.00    Types: Cigarettes  . Smokeless tobacco: Never Used  . Tobacco comment: 1 PPD   Substance and Sexual Activity  . Alcohol use: No    Alcohol/week: 0.0 standard drinks  . Drug use: No    Types: Marijuana, "Crack" cocaine    Comment: 09/28/2013 "no marijuana since Apr 11, 2010, no crack/cocaine 1989"  . Sexual activity: Not Currently  Lifestyle  . Physical activity    Days per week: Not on file    Minutes per session: Not on file  . Stress: Not on file  Relationships  . Social Herbalist on phone: Not on file    Gets together: Not on file    Attends religious service: Not on file    Active member of club or organization: Not on file    Attends meetings of clubs or organizations: Not on file    Relationship status: Not on file  Other Topics Concern  . Not on file  Social History Narrative   On disability. Lives with son in [place]. Formerly worked as Training and development officer.    Boyfriend passed away stage 4 cancer 04/11/13.   S/p L BKA 2013-04-11. In wheelchair in paritially suitable apartment.    Family History  Problem Relation Age of Onset  . Diverticulosis Mother   . Diabetes Mother   . Hypertension Mother   . Congestive Heart Failure Mother   . Asthma Father   . CAD Sister 68       MI at age 43 per patient.  However, she has not had a stent or CABG.   . Heart disease Sister        before age 65  . Breast cancer Neg Hx     O: Ht Readings from Last 2 Encounters:  06/22/19 5\' 7"  (1.702 m)  06/20/19 5\' 7"  (1.702 m)   Wt Readings from Last 2 Encounters:  06/30/19 245 lb 8 oz (111.4 kg)  06/22/19 244 lb 3.2 oz (110.8 kg)   There is no height or weight on file to calculate BMI.  CAT ASSESSMENT  Rank each of the following items on a scale of 0 to 5 (with 5 being most severe) Write a #  0-5 in each box  I never cough (0) > I cough all the time (5) 3  I have no phlegm (mucus) in my chest (0) > My chest is completely full of phlegm (mucus) (5) 0 (but varies)  My chest does not feel tight at all (0) > My chest feels very tight (5) 2  When I walk up a hill or one  flight of stairs I am not breathless (0) > When I walk up a hill or one flight of stairs I am very breathless (5) 5  I am not limited doing any activities at home (0) > I am very limited doing activities at home (5) 3.5  I am confident leaving my home despite my lung function (0) > I am not at all confident leaving my home because of my lung condition (5)  0  I sleep soundly (0) > I don't sleep soundly because of my lung condition (5) 0 - doesn't think its due to lung condition  I have lots of energy (0) > I have no energy at all (5) 4    Total CAT Score: 17.5 Most recent blood eosinophil count was 1 cells/microL taken on 05/11/2019.   An after visit summary was provided and patient advised to follow up in 4-6 weeks or sooner if any changes in condition or questions regarding medications arise.   The patient verbalized understanding of information provided by repeating back concepts discussed.

## 2019-07-01 NOTE — Telephone Encounter (Signed)
Patient has questions about the Cream listed below she picked up from the pharmacy before she uses it. permethrin (ELIMITE) 1 % lotion   Please call patient back

## 2019-07-01 NOTE — Progress Notes (Signed)
Internal Medicine Clinic Attending  I saw and evaluated the patient.  I personally confirmed the key portions of the history and exam documented by Dr. Marva Panda and I reviewed pertinent patient test results.  The assessment, diagnosis, and plan were formulated together and I agree with the documentation in the resident's note.  As a correction, the patient has an isolated small tender pre-auricular right sided lymph node. I do not see any signs of cellulitis of the cheek or scalp. Ear canal looks normal. No eye complaints either. Low risk for malignancy based on my exam. Without signs of a source of infection, we are going to hold off on empiric antibiotic. We gave her instructions to call or return to clinic if she has worsening symptoms.

## 2019-07-02 LAB — URINE CULTURE: Culture: >=100000 — AB

## 2019-07-04 ENCOUNTER — Ambulatory Visit: Payer: Medicare Other

## 2019-07-04 ENCOUNTER — Ambulatory Visit (INDEPENDENT_AMBULATORY_CARE_PROVIDER_SITE_OTHER): Payer: Medicare Other | Admitting: Physician Assistant

## 2019-07-04 ENCOUNTER — Other Ambulatory Visit: Payer: Self-pay

## 2019-07-04 ENCOUNTER — Telehealth: Payer: Self-pay | Admitting: *Deleted

## 2019-07-04 ENCOUNTER — Encounter: Payer: Self-pay | Admitting: Physician Assistant

## 2019-07-04 VITALS — Ht 67.0 in | Wt 245.5 lb

## 2019-07-04 DIAGNOSIS — L97511 Non-pressure chronic ulcer of other part of right foot limited to breakdown of skin: Secondary | ICD-10-CM | POA: Diagnosis not present

## 2019-07-04 DIAGNOSIS — M7541 Impingement syndrome of right shoulder: Secondary | ICD-10-CM

## 2019-07-04 DIAGNOSIS — Z794 Long term (current) use of insulin: Secondary | ICD-10-CM | POA: Diagnosis not present

## 2019-07-04 DIAGNOSIS — Z89512 Acquired absence of left leg below knee: Secondary | ICD-10-CM | POA: Diagnosis not present

## 2019-07-04 DIAGNOSIS — E1142 Type 2 diabetes mellitus with diabetic polyneuropathy: Secondary | ICD-10-CM | POA: Diagnosis not present

## 2019-07-04 MED ORDER — LIDOCAINE HCL 1 % IJ SOLN
5.0000 mL | INTRAMUSCULAR | Status: AC | PRN
  Administered 2019-07-04: 5 mL

## 2019-07-04 MED ORDER — METHYLPREDNISOLONE ACETATE 40 MG/ML IJ SUSP
40.0000 mg | INTRAMUSCULAR | Status: AC | PRN
  Administered 2019-07-04: 40 mg via INTRA_ARTICULAR

## 2019-07-04 NOTE — Telephone Encounter (Signed)
Placed call to patient. No answer. Left message on VM requesting return call. [name], RN, BSN

## 2019-07-04 NOTE — Progress Notes (Signed)
Office Visit Note   Patient: Jennifer [name]           Date of Birth: Nov 17, 1961           MRN: [id number] Visit Date: 07/04/2019              Requested by: Bartholomew Crews, MD [address] [place],  [place] [address] PCP: Bartholomew Crews, MD  Chief Complaint  Patient presents with  . Right Shoulder - Pain    04/01/2019 Revision BKA left  . Left Leg - Routine Post Op    04/01/2019 Revision BKA left       HPI: The patient is a 58 year old woman who is seen for follow-up of a ulcer over the dorsum of her right foot.  This is markedly improved utilizing Silvadene cream to the area.  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 She comes in today with a new complaint of right shoulder pain.  She was seen by her primary care and underwent an ultrasound of the shoulder and was felt to have some impingement and offered an injection in the PCPs office but opted to come to our office for injection.  Assessment & Plan: Visit Diagnoses:  1. Impingement syndrome of right shoulder   2. Skin ulcer of right foot, limited to breakdown of skin ([place])   3. Acquired absence of left leg below knee (HCC)   4. Type 2 diabetes mellitus with diabetic polyneuropathy, with long-term current use of insulin (HCC)     Plan: Continue local care to the right foot with Silvadene cream and may transition to the Vive medical compression sock when she is comfortable. After informed consent the right shoulder was injected with a combination of lidocaine and Depo-Medrol under sterile techniques and the patient tolerated this well.  She will follow-up in a couple of weeks.  She may require further evaluation with MRI scanning should she not have any results from the steroid injection.  Follow-Up Instructions: Return in about 2 weeks (around 07/18/2019).   Ortho Exam  Patient is alert, oriented, no adenopathy,  well-dressed, normal affect, normal respiratory effort. The right foot continues to heal in nicely and is nearly fully healed.  There are no signs of cellulitis of the foot.  She does have a new little ulcer over the right second toe and has been utilizing Silvadene to this as well and she should continue this.  There are no signs of visible tendon or bone over the toe ulcer and it appears to be superficial as well.  There is no sausage toe.  She has a palpable pedal pulse on the right foot. Right shoulder has decreased forward flexion internal and external rotation with abduction and pain with positive impingement symptoms.  She is neurovascularly intact distally.  She underwent an injection of lidocaine and steroids to the right shoulder and we will see how she does following this.  Imaging: No results found.   Labs: Lab Results  Component Value Date   HGBA1C 9.8 (A) 05/18/2019   HGBA1C 9.3 (A) 02/22/2019   HGBA1C 8.2 (A) 10/21/2018   ESRSEDRATE 84 (H) 01/27/2013   ESRSEDRATE 65 (H) 01/26/2013   ESRSEDRATE 15 08/13/2009   CRP 31.2 (H) 01/27/2013   REPTSTATUS 07/02/2019 FINAL 06/30/2019   GRAMSTAIN  01/15/2017    RARE WBC PRESENT,BOTH PMN AND MONONUCLEAR NO ORGANISMS SEEN    CULT >=100,000 COLONIES/mL ESCHERICHIA  COLI (A) 06/30/2019   LABORGA ESCHERICHIA COLI (A) 06/30/2019     Lab Results  Component Value Date   ALBUMIN 3.1 (L) 05/14/2019   ALBUMIN 3.2 (L) 05/13/2019   ALBUMIN 3.5 05/11/2019    Lab Results  Component Value Date   MG 1.8 01/09/2017   MG 1.9 11/07/2016   MG 1.8 05/16/2014   No results found for: VD25OH  No results found for: PREALBUMIN CBC EXTENDED Latest Ref Rng & Units 05/14/2019 05/13/2019 05/12/2019  WBC 4.0 - 10.5 K/uL 8.8 8.3 12.4(H)  RBC 3.87 - 5.11 MIL/uL 3.88 4.02 3.94  HGB 12.0 - 15.0 g/dL 12.1 12.6 12.3  HCT 36.0 - 46.0 % 36.7 37.3 37.9  PLT 150 - 400 K/uL 137(L) 149(L) 154  NEUTROABS 1.7 - 7.7 K/uL - - -  LYMPHSABS 0.7 - 4.0 K/uL - - -      Body mass index is 38.45 kg/m.  Orders:  No orders of the defined types were placed in this encounter.  No orders of the defined types were placed in this encounter.    Procedures: Large Joint Inj: R subacromial bursa on 07/04/2019 4:12 PM Indications: diagnostic evaluation and pain Details: 22 G 1.5 in needle, posterior approach  Arthrogram: No  Medications: 5 mL lidocaine 1 %; 40 mg methylPREDNISolone acetate 40 MG/ML Outcome: tolerated well, no immediate complications Procedure, treatment alternatives, risks and benefits explained, specific risks discussed. Consent was given by the patient. Immediately prior to procedure a time out was called to verify the correct patient, procedure, equipment, support staff and site/side marked as required. Patient was prepped and draped in the usual sterile fashion.    Great or stretching  Clinical Data: No additional findings.  ROS:  All other systems negative, except as noted in the HPI. Review of Systems  Objective: Vital Signs: Ht 5\' 7"  (1.702 m)   Wt 245 lb 8 oz (111.4 kg)   BMI 38.45 kg/m   Specialty Comments:  No specialty comments available.  PMFS History: Patient Active Problem List   Diagnosis Date Noted  . Skin rash 06/30/2019  . Cellulitis of right external cheek 06/30/2019  . Shoulder impingement, right 06/22/2019  . Vulvar cysts 01/27/2019  . Urinary incontinence 05/13/2018  . OSA (obstructive sleep apnea) 06/12/2017  . Morbid obesity with BMI of 40.0-44.9, adult ([place]) 03/26/2017  . Toe amputation status, right 01/16/2017  . Thrombocytopenia ([place]) 06/05/2016  . 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 Type 2 diabetes with nephropathy ([place]) 09/07/2015  . Uncontrolled diabetes mellitus with retinopathy, due to underlying condition, without macular edema ([place]) 09/05/2015  . Diabetic retinopathy ([place]) 09/05/2015  . Counseling regarding end of life decision making 06/14/2015  . Atherosclerosis of aorta ([place]) 04/04/2015  .  Anemia 10/05/2014  . Chronic diastolic heart failure ([place])   . Tobacco abuse   . Severe obesity (BMI >= 40) ([place]) 03/02/2013  . Abnormality of gait 03/01/2013  . Healthcare maintenance 07/10/2012  . Chronic prescription opiate use 12/03/2011  . Secondary diabetes mellitus with peripheral vascular disease ([place]) 08/27/2011  . 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 Hypertension associated with diabetes ([place]) 11/29/2009  . Chronic insomnia 10/25/2009  . GASTROESOPHAGEAL REFLUX DISEASE 11/24/2008  . Depression, major, severe recurrence ([place]) 04/06/2008  . DM (diabetes mellitus) type II uncontrolled, periph vascular disorder ([place]) 04/02/2007  . Hyperlipidemia associated with type 2 diabetes mellitus ([place]) 01/08/2007  . COPD (chronic obstructive pulmonary disease) ([place]) 01/08/2007   Past Medical History:  Diagnosis Date  .  Anginal pain [facility])    '3' of 10 ischemia ruled out 9/9   . Arthritis of lumbar spine   . Asthma   . Cataract   . CHF (congestive heart failure) ([place])   . 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 Chronic bronchitis ([place])    "I get it alot" (09/28/2013)  . Chronic diastolic heart failure (HCC)    grade 2 per 2D echocardiogram (01/2013)  . Chronic lower back pain   . Chronic osteomyelitis of foot (HCC)    chronic, right secondary to diabetic foot ulcers  . Chronic pain syndrome 12/03/2011   Likely secondary to depression, "fibromyalgia", neuropathy, and obesity. Lumbar MRI 2014 no sig change from prior (2008) : Stable hypertrophic facet disease most notable at L4-5. Stable shallow left foraminal/extraforaminal disc protrusion at L4-5. No direct neural compression.      Marland Kitchen COPD 01/08/2007   PFT's 05/2007 : FEV1/FVC 82, FEV1 64% pred, FEF 25-75% 40% predicted, 16% improvement in FEV1 with bronchodilators.     . Depression   . Diabetes mellitus without complication ([place])    Type II  . Diabetic peripheral neuropathy ([place])   . DVT of upper  extremity (deep vein thrombosis) ([place]) 03/11/2013   Secondary to PICC line. Right brachial vein, diagnosed on 03/10/2013 Coumadin for 3 months. End date 06/10/2013   . Dyspnea    "smoker"  . Environmental allergies    Hx: of  . Exertional shortness of breath   . Fatty liver 2003   observed on ultrasound abdomen  . Fibromyalgia   . GERD (gastroesophageal reflux disease)   . Glaucoma   . History of use of hearing aid   . Hyperlipidemia   . Hyperplastic colon polyp 12/2010   Per colonoscopy (12/2010) - Dr. Deatra Ina  . Hypertension   . 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 Blood cultures 01/26/13 S. Aureus and GBS. Blood cultures Feb 6th, 8th, and 9th and March were negative.Repeat TEE 3/20 negative for vegitations  . 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 2/2 tobacco use, DM, and cont weight bearing on surgical wound and developed gangrene   . Pneumonia   . Polymicrobial bacterial infection 01/2013   GBS and S. aureus bacteremia // Source likely infected diabetic foot ulcer  . PVD (peripheral vascular disease) with claudication ([place])    Stents to bilateral common iliac arteries (left 2005, right 2008), on chronic plavix  . Rheumatoid arthritis ([place])   . S/P BKA (below knee amputation) unilateral [facility])    Oct 2014 L - failed limb preserving treatment. 2/2 tobacco use, DM, and cont weight bearing on surgical wound and developed gangrene   . Tobacco abuse   . Type II diabetes mellitus with peripheral circulatory disorders, uncontrolled DX: 1993   Insulin dep. Poor control. Complicated by diabetic foot ulcer and diabetic eye disease.    Marland Kitchen Ulcer of foot, chronic (HCC)    Left. No OM per MRI (01/2013)    Family History  Problem Relation Age of Onset  . Diverticulosis Mother   . Diabetes Mother   . Hypertension Mother   . Congestive Heart Failure Mother   . Asthma  Father   . CAD Sister 34       MI at age 27 per patient.  However, she has not had a stent or CABG.   . Heart disease  Sister        before age 24  . Breast cancer Neg Hx     Past Surgical History:  Procedure Laterality Date  . ABDOMINAL HYSTERECTOMY  1997   secondary to uterine fibroids  . AMPUTATION Left 08/31/2013   Procedure: AMPUTATION RAY;  Surgeon: Newt Minion, MD;  Location: [place];  Service: Orthopedics;  Laterality: Left;  Left Foot 5th Ray Amputation  . AMPUTATION Left 09/28/2013   Procedure: Left Midfoot amputation;  Surgeon: Newt Minion, MD;  Location: [place];  Service: Orthopedics;  Laterality: Left;  Left Midfoot amputation  . AMPUTATION Left 10/14/2013   Procedure: AMPUTATION BELOW KNEE- left;  Surgeon: Newt Minion, MD;  Location: [place];  Service: Orthopedics;  Laterality: Left;  Left Below Knee Amputation   . AMPUTATION TOE Right 01/15/2017   Procedure: AMPUTATION 5th TOE RIGHT FOOT;  Surgeon: Edrick Kins, DPM;  Location: [place];  Service: Podiatry;  Laterality: Right;  . APPLICATION OF WOUND VAC  04/01/2019   Procedure: Application Of Wound Vac;  Surgeon: Newt Minion, MD;  Location: [place];  Service: Orthopedics;;  . BLADDER SURGERY     bladder reconstruction surgery  . BREAST BIOPSY     multiple-benign per pt  . COLONOSCOPY    . ESOPHAGOGASTRODUODENOSCOPY N/A 09/20/2013   Procedure: ESOPHAGOGASTRODUODENOSCOPY (EGD);  Surgeon: Jerene Bears, MD;  Location: [place];  Service: Gastroenterology;  Laterality: N/A;  . FOOT AMPUTATION THROUGH METATARSAL Left 09/28/2013  . GANGLION CYST EXCISION     multiple  . PERIPHERAL VASCULAR INTERVENTION     stents in lower ext  . SHOULDER ARTHROSCOPY W/ ROTATOR CUFF REPAIR Bilateral   . SKIN SPLIT GRAFT Bilateral 05/13/2013   Procedure: Right and Left Foot Allograft Skin Graft;  Surgeon: Newt Minion, MD;  Location: [place];  Service: Orthopedics;  Laterality: Bilateral;  Right and Left Foot Allograft Skin Graft  . STUMP  REVISION Left 04/01/2019   Procedure: REVISION LEFT BELOW KNEE AMPUTATION;  Surgeon: Newt Minion, MD;  Location: [place];  Service: Orthopedics;  Laterality: Left;  . TEE WITHOUT CARDIOVERSION N/A 01/31/2013   Procedure: TRANSESOPHAGEAL ECHOCARDIOGRAM (TEE);  Surgeon: Fay Records, MD;  Location: [place];  Service: Cardiovascular;  Laterality: N/A;  Rm [phone]  . TEE WITHOUT CARDIOVERSION N/A 03/10/2013   Procedure: TRANSESOPHAGEAL ECHOCARDIOGRAM (TEE);  Surgeon: Larey Dresser, MD;  Location: [place];  Service: Cardiovascular;  Laterality: N/A;  Rm. [phone]  . TOE AMPUTATION Left 08/31/2013   4TH & 5 TH TOE   . TONSILLECTOMY    . TUBAL LIGATION    . WRIST SURGERY Right    "for tumors" (09/28/2013)   Social History   Occupational History  . Occupation: Disability    Comment: previously worked as a Engineer, materials  . Smoking status: Current Every Day Smoker    Packs/day: 1.00    Years: 50.00    Pack years: 50.00    Types: Cigarettes  . Smokeless tobacco: Never Used  . Tobacco comment: 1 PPD  Substance and Sexual Activity  . Alcohol use: No    Alcohol/week: 0.0 standard drinks  . Drug use: No    Types: Marijuana, "Crack" cocaine    Comment: 09/28/2013 "no marijuana since 2011, no crack/cocaine 1989"  . Sexual activity: Not Currently

## 2019-07-04 NOTE — Telephone Encounter (Signed)
Jennifer Crews, MD  P Imp Triage Nurse Pool        Would you pls ask her which ABX she has been on recently (3 months)? And did she see urology and did they tx this? Thanks!

## 2019-07-06 ENCOUNTER — Telehealth: Payer: Self-pay | Admitting: *Deleted

## 2019-07-06 MED ORDER — CEPHALEXIN 500 MG PO CAPS: 500.0000 mg | ORAL_CAPSULE | Freq: Four times a day (QID) | ORAL | 0 refills | Status: AC

## 2019-07-06 NOTE — Telephone Encounter (Signed)
Patient returned call. States she does not remember which antibiotics she has been on. Sees Dr. Gloriann Loan with [facility]. Missed appt with him on 06/30/2019 2/2 [facility] appt took longer than expected. C/o severe burning with urination and "whole body clenches" with urination. This has been going on for 4-5 months. Also voiding q 15-30 minutes. Wearing pull ups because she can't make it to the bathroom in time and pull ups are always saturated. Has not received results of most recent urine culture and is not currently on antibiotic. [name], RN, BSN

## 2019-07-06 NOTE — Telephone Encounter (Signed)
Left message on patient's VM with Enzo Montgomery name and contact info [phone]) to discuss power w/c. [name], RN, BSN

## 2019-07-06 NOTE — Telephone Encounter (Signed)
Patient called today and stated she was told by [employer] there was no order for a power w/c but someone had been out to her home to discuss colors of chairs. The order for eval was faxed on 03/10/2019 to Andria Rhein at [facility]. Community message sent to [place] today requesting status update. [name], RN, BSN

## 2019-07-06 NOTE — Telephone Encounter (Addendum)
I called [facility].  Most recent antibiotic she got was Bactrim on June 15.  Previously she had gotten Keflex in may and March and fluconazole also in March.  Her culture is E. coli with resistance to Bactrim and ampicillin.  Nitrofurantoin was not tested.  It was sensitive to Cipro and the cephalosporins.  Cipro and Keflex, 2 options and since I know she tolerates Keflex, even though she got it and they thought it is from nonprescribed.  Please let her know that her urine culture did show an infection and that the antibiotic should kill be infection.  Keflex 500 mg 4 times a day for 5 days.

## 2019-07-06 NOTE — Telephone Encounter (Signed)
Returned call to patient. No answer. Left detailed message on self-identified VM of information below. Requested return call for any questions. [name], RN, BSN

## 2019-07-07 ENCOUNTER — Encounter: Payer: Self-pay | Admitting: Dietician

## 2019-07-07 ENCOUNTER — Ambulatory Visit: Payer: Medicare Other | Admitting: Dietician

## 2019-07-07 DIAGNOSIS — M4696 Unspecified inflammatory spondylopathy, lumbar region: Secondary | ICD-10-CM | POA: Diagnosis not present

## 2019-07-07 DIAGNOSIS — I152 Hypertension secondary to endocrine disorders: Secondary | ICD-10-CM | POA: Diagnosis not present

## 2019-07-07 DIAGNOSIS — G894 Chronic pain syndrome: Secondary | ICD-10-CM | POA: Diagnosis not present

## 2019-07-07 DIAGNOSIS — E1151 Type 2 diabetes mellitus with diabetic peripheral angiopathy without gangrene: Secondary | ICD-10-CM | POA: Diagnosis not present

## 2019-07-07 DIAGNOSIS — T8781 Dehiscence of amputation stump: Secondary | ICD-10-CM | POA: Diagnosis not present

## 2019-07-07 DIAGNOSIS — H42 Glaucoma in diseases classified elsewhere: Secondary | ICD-10-CM | POA: Diagnosis not present

## 2019-07-07 DIAGNOSIS — E1165 Type 2 diabetes mellitus with hyperglycemia: Secondary | ICD-10-CM | POA: Diagnosis not present

## 2019-07-07 DIAGNOSIS — E1121 Type 2 diabetes mellitus with diabetic nephropathy: Secondary | ICD-10-CM | POA: Diagnosis not present

## 2019-07-07 DIAGNOSIS — E11319 Type 2 diabetes mellitus with unspecified diabetic retinopathy without macular edema: Secondary | ICD-10-CM | POA: Diagnosis not present

## 2019-07-07 DIAGNOSIS — T879 Unspecified complications of amputation stump: Secondary | ICD-10-CM | POA: Diagnosis not present

## 2019-07-07 DIAGNOSIS — Z9181 History of falling: Secondary | ICD-10-CM | POA: Diagnosis not present

## 2019-07-07 DIAGNOSIS — J449 Chronic obstructive pulmonary disease, unspecified: Secondary | ICD-10-CM | POA: Diagnosis not present

## 2019-07-07 DIAGNOSIS — Z89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): Secondary | ICD-10-CM | POA: Diagnosis not present

## 2019-07-07 DIAGNOSIS — G4733 Obstructive sleep apnea (adult) (pediatric): Secondary | ICD-10-CM | POA: Diagnosis not present

## 2019-07-07 DIAGNOSIS — E1139 Type 2 diabetes mellitus with other diabetic ophthalmic complication: Secondary | ICD-10-CM | POA: Diagnosis not present

## 2019-07-07 DIAGNOSIS — I700 Atherosclerosis of aorta: Secondary | ICD-10-CM | POA: Diagnosis not present

## 2019-07-07 DIAGNOSIS — E1140 Type 2 diabetes mellitus with diabetic neuropathy, unspecified: Secondary | ICD-10-CM | POA: Diagnosis not present

## 2019-07-07 DIAGNOSIS — Z794 Long term (current) use of insulin: Secondary | ICD-10-CM | POA: Diagnosis not present

## 2019-07-07 DIAGNOSIS — Z89512 Acquired absence of left leg below knee: Secondary | ICD-10-CM | POA: Diagnosis not present

## 2019-07-07 DIAGNOSIS — M797 Fibromyalgia: Secondary | ICD-10-CM | POA: Diagnosis not present

## 2019-07-07 DIAGNOSIS — I5032 Chronic diastolic (congestive) heart failure: Secondary | ICD-10-CM | POA: Diagnosis not present

## 2019-07-07 NOTE — Progress Notes (Signed)
Diabetes Self-Management Education  Visit Type:  4 Month Follow-Up  Appt. Start Time: 9390 Appt. End Time: 3009  07/07/2019  Jennifer [name], identified by name and date of birth, is a 58 y.o. female with a diagnosis of Diabetes:  Marland Kitchen  Type 2 ASSESSMENT  Reports would on her right foot due to not feeling that she was running over it with her walker. We discussed that her foot is very high risk and needs to be seen ongoing by podiatry and checked daily. She agreed to schedule an appointment with Dr. Daylene Katayama.   She also wants to schedule an eye exam.   She  confirms she is taking 120 units of Tresiba daily with no ill affects. She did not know about her antibiotic called in and will pick it up and start it right away. I urged her to check her blood sugar often as infection clears as I suspect she will need less insulin. Explained that she may need a different type of insulin during the day to cover her food and beverage intake rather than try to cover it with [place]. Could consider meal time insulin for lunch/dinner in addition to tresiba- start with one injection/day vs [place]  Once a day before a meal.    Diabetes Self-Management Education - 07/07/19 1600      Health Coping   How would you rate your overall health?  Good      Patient Self-Evaluation of Goals - Patient rates self as meeting previously set goals (% of time)   Nutrition  25 - 50%   still drinking a considerable amount of regular sodas   Monitoring  50 - 75 %    Reducing Risk  < 25%   thinking about quitting smoking but not quite ready     Post-Education Assessment   Patient understands the diabetes disease and treatment process.  --    Patient understands monitoring blood glucose, interpreting and using results  Demonstrates understanding / competency      Outcomes   Program Status  Completed      Subsequent Visit   Since your last visit have you continued or begun to take your medications as prescribed?  Yes    taking more than prescribed due to high blood sugars   Since your last visit have you had your blood pressure checked?  Yes    Is your most recent blood pressure lower, unchanged, or higher since your last visit?  Unchanged    Since your last visit have you experienced any weight changes?  Loss    Weight Loss (lbs)  30    she is happy with loss of 30# in past 2 years   Since your last visit, are you checking your blood glucose at least once a day?  Yes   195 today on her new Onetouch verio meter given to her today      Learning Objective:  Patient will have a greater understanding of diabetes self-management. Patient education plan is to attend individual and/or group sessions per assessed needs and concerns. Plan:   Patient Instructions  Check blood sugar more often as you will probably require less insulin- PLease reduce your dose back if needed after infection clears  Make an appointment with podiatry   Call me next week with blood sugars.   Pick up [place] [phone]   Expected Outcomes:  Demonstrated interest in learning. Expect positive outcomes  Education material provided: Diabetes Resources  If  problems or questions, patient to contact team via:  Phone  Future DSME appointment: - 3-4 months  [name], RD 07/07/2019 5:13 PM.

## 2019-07-07 NOTE — Patient Instructions (Signed)
Check blood sugar more often as you will probably require less insulin- PLease reduce your dose back if needed after infection clears  Make an appointment with podiatry   Call me next week with blood sugars.   Pick up [place] [phone]

## 2019-07-09 DIAGNOSIS — Z4781 Encounter for orthopedic aftercare following surgical amputation: Secondary | ICD-10-CM | POA: Diagnosis not present

## 2019-07-12 ENCOUNTER — Other Ambulatory Visit: Payer: Self-pay | Admitting: Internal Medicine

## 2019-07-12 DIAGNOSIS — Z89512 Acquired absence of left leg below knee: Secondary | ICD-10-CM | POA: Diagnosis not present

## 2019-07-13 ENCOUNTER — Other Ambulatory Visit: Payer: Self-pay

## 2019-07-13 ENCOUNTER — Encounter: Payer: Self-pay | Admitting: Internal Medicine

## 2019-07-13 ENCOUNTER — Ambulatory Visit (INDEPENDENT_AMBULATORY_CARE_PROVIDER_SITE_OTHER): Payer: Medicare Other | Admitting: Internal Medicine

## 2019-07-13 VITALS — BP 128/86 | HR 80 | Temp 97.9°F | Ht 67.0 in | Wt 252.6 lb

## 2019-07-13 DIAGNOSIS — Z89512 Acquired absence of left leg below knee: Secondary | ICD-10-CM

## 2019-07-13 DIAGNOSIS — B9620 Unspecified Escherichia coli [E. coli] as the cause of diseases classified elsewhere: Secondary | ICD-10-CM | POA: Diagnosis not present

## 2019-07-13 DIAGNOSIS — E118 Type 2 diabetes mellitus with unspecified complications: Secondary | ICD-10-CM | POA: Diagnosis not present

## 2019-07-13 DIAGNOSIS — N390 Urinary tract infection, site not specified: Secondary | ICD-10-CM | POA: Diagnosis not present

## 2019-07-13 DIAGNOSIS — Z87440 Personal history of urinary (tract) infections: Secondary | ICD-10-CM

## 2019-07-13 DIAGNOSIS — R590 Localized enlarged lymph nodes: Secondary | ICD-10-CM

## 2019-07-13 DIAGNOSIS — Q181 Preauricular sinus and cyst: Secondary | ICD-10-CM | POA: Insufficient documentation

## 2019-07-13 MED ORDER — CIPROFLOXACIN HCL 500 MG PO TABS: 500.0000 mg | ORAL_TABLET | Freq: Two times a day (BID) | ORAL | 0 refills | Status: DC

## 2019-07-13 NOTE — Assessment & Plan Note (Addendum)
Subacute periauricular lymph node. Patient presented due to no improvement of knot in front of her eight ear. She has a flexible, moderately tended per auricular lymph node at right side.  No other lymph adenopathy.  No fever. No evidence of infection in face or sculp. Facial movement, sensation, ear and oropharyngeal exam is unremarkable.  Unclear etiology. Will refer to ENT for further work up.

## 2019-07-13 NOTE — Telephone Encounter (Signed)
Both the baclofen and hydroxyzine were stopped at her last hospital discharge.  I am resuming the baclofen.  If she is still having itching, she needs an evaluation rather than continuing to take the hydroxyzine.

## 2019-07-13 NOTE — Telephone Encounter (Signed)
Pt is here today for ACC appt.

## 2019-07-13 NOTE — Assessment & Plan Note (Signed)
Patient continue to have dysuria and frequency. No response to Keflex although UC showed sensitivity to that.  Complicated chronic/recurrent UTI (in setting of uncontrolled DM. Will start work up for evaluation for structural/functional abnormalities, nephrolithiasis,.)  -Repeat UA and UC today -Prescribing Ciprofloxacin 500 mg BID x 7 days -Renal US

## 2019-07-13 NOTE — Progress Notes (Signed)
CC: f/u for knot in front of ear HPI:  [name] is a 58 y.o. with PMHx as documented below, presented for follow up of preauricular know and also UTI. Please refer to problem based charting for further details and assessment and plan of current problem and chronic medical conditions.  Past Medical History:  Diagnosis Date  . Anginal pain [facility])    '3' of 10 ischemia ruled out 9/9   . Arthritis of lumbar spine   . Asthma   . Cataract   . CHF (congestive heart failure) ([place])   . 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 Chronic bronchitis ([place])    "I get it alot" (09/28/2013)  . Chronic diastolic heart failure (HCC)    grade 2 per 2D echocardiogram (01/2013)  . Chronic lower back pain   . Chronic osteomyelitis of foot (HCC)    chronic, right secondary to diabetic foot ulcers  . Chronic pain syndrome 12/03/2011   Likely secondary to depression, "fibromyalgia", neuropathy, and obesity. Lumbar MRI 2014 no sig change from prior (2008) : Stable hypertrophic facet disease most notable at L4-5. Stable shallow left foraminal/extraforaminal disc protrusion at L4-5. No direct neural compression.      Marland Kitchen COPD 01/08/2007   PFT's 05/2007 : FEV1/FVC 82, FEV1 64% pred, FEF 25-75% 40% predicted, 16% improvement in FEV1 with bronchodilators.     . Depression   . Diabetes mellitus without complication ([place])    Type II  . Diabetic peripheral neuropathy ([place])   . DVT of upper extremity (deep vein thrombosis) ([place]) 03/11/2013   Secondary to PICC line. Right brachial vein, diagnosed on 03/10/2013 Coumadin for 3 months. End date 06/10/2013   . Dyspnea    "smoker"  . Environmental allergies    Hx: of  . Exertional shortness of breath   . Fatty liver 2003   observed on ultrasound abdomen  . Fibromyalgia   . GERD (gastroesophageal reflux disease)   . Glaucoma   . History of use of hearing aid   . Hyperlipidemia   . Hyperplastic colon polyp 12/2010   Per colonoscopy (12/2010) - Dr.  Deatra Ina  . Hypertension   . 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 Blood cultures 01/26/13 S. Aureus and GBS. Blood cultures Feb 6th, 8th, and 9th and March were negative.Repeat TEE 3/20 negative for vegitations  . 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 2/2 tobacco use, DM, and cont weight bearing on surgical wound and developed gangrene   . Pneumonia   . Polymicrobial bacterial infection 01/2013   GBS and S. aureus bacteremia // Source likely infected diabetic foot ulcer  . PVD (peripheral vascular disease) with claudication ([place])    Stents to bilateral common iliac arteries (left 2005, right 2008), on chronic plavix  . Rheumatoid arthritis ([place])   . S/P BKA (below knee amputation) unilateral [facility])    Oct 2014 L - failed limb preserving treatment. 2/2 tobacco use, DM, and cont weight bearing on surgical wound and developed gangrene   . Tobacco abuse   . Type II diabetes mellitus with peripheral circulatory disorders, uncontrolled DX: 1993   Insulin dep. Poor control. Complicated by diabetic foot ulcer and diabetic eye disease.    Marland Kitchen Ulcer of foot, chronic (HCC)    Left. No OM per MRI (01/2013)   Review of Systems:  Review of Systems  Constitutional: Negative for chills and fever.  Gastrointestinal: Negative for nausea and vomiting.  Genitourinary: Positive for dysuria and frequency.   Physical Exam:  Vitals:   07/13/19 1319  BP: 128/86  Pulse: 80  Weight: 252 lb 9.6 oz (114.6 kg)   Physical Exam  Constitutional: She is oriented to person, place, and time. No distress.  HENT:  Right Ear: Tympanic membrane, external ear and ear canal normal. No drainage.  Left Ear: Tympanic membrane, external ear and ear canal normal. No drainage.  Cardiovascular: Normal rate and regular rhythm.  No murmur heard. Respiratory: Effort normal and breath sounds normal.    Musculoskeletal:     Comments: Left BKA  Lymphadenopathy:       Head (right side): Preauricular adenopathy present.    She has no cervical adenopathy.    She has no axillary adenopathy.       Right: No supraclavicular adenopathy present.       Left: No supraclavicular adenopathy present.  Neurological: She is alert and oriented to person, place, and time.    Assessment & Plan:   See Encounters Tab for problem based charting.  Patient discussed with Dr. Rebeca Alert

## 2019-07-13 NOTE — Patient Instructions (Signed)
It was our pleasure taking care of you in our clinic today.  You were seen due to UTI and also knot in front of your ear that both did not get better since last visit. I refer you to ear nose and throat doctor for evaluation of the knot. For UTI, I send a prescription for a new antibiotic: Ciprofloxacin to be taken every 12 hours. Also do urine test and kidney ultrasound and I will call you if the result will be abnormal.  Please come back to clinic in 2 weeks if no improvement for UTI.  Please take all of your medications as before and contact us if you have any question or concern.   I understand that you would not like to discuss diabetes today. Please follow up in our clinic with Dr, [name] for that.  As always, if having severe symptoms, please seek medical attention at emergency room.  Thank you Dr. Linna Hoff

## 2019-07-14 LAB — URINALYSIS, ROUTINE W REFLEX MICROSCOPIC
Bilirubin, UA: NEGATIVE
Ketones, UA: NEGATIVE
Nitrite, UA: NEGATIVE
Protein,UA: NEGATIVE
Specific Gravity, UA: 1.022 (ref 1.005–1.030)
Urobilinogen, Ur: 0.2 mg/dL (ref 0.2–1.0)
pH, UA: 5 (ref 5.0–7.5)

## 2019-07-14 LAB — MICROSCOPIC EXAMINATION
Casts: NONE SEEN /lpf
RBC, Urine: NONE SEEN /hpf (ref 0–2)
WBC, UA: >30 /hpf — AB (ref 0–5)

## 2019-07-14 NOTE — Progress Notes (Signed)
Internal Medicine Clinic Attending  Case discussed with Dr. Myrtie Hawk at the time of the visit.  We reviewed the resident's history and exam and pertinent patient test results.  I agree with the assessment, diagnosis, and plan of care documented in the resident's note.  Persistent right preauricular lymphadenopathy without clear etiology, no accompanying conjunctivitis or cellulitis. Differential includes cat scratch disease, mycobacterial lymphadenitis, other bacterial lymphadenitis, and lymphoma. If not improving in the next few weeks, will need evaluation and possible excisional biopsy, so referred to ENT for further evaluation.  Also still having persistent UTI symptoms despite treatment with cephalexin. Urine culture showed E coli sensitive to cephalexin, so unclear why she has not had resolution of symptoms. Unfortunately, did not give urine sample for repeat UA and culture. Was also sensitive to ciprofloxacin, which would also treat bartonella (see above), will give 7 day course and follow up on symptom resolution.   Lenice Pressman, M.D., Ph.D.

## 2019-07-15 LAB — URINE CULTURE

## 2019-07-18 ENCOUNTER — Ambulatory Visit: Payer: Medicare Other | Admitting: Physician Assistant

## 2019-07-18 ENCOUNTER — Telehealth: Payer: Self-pay | Admitting: Internal Medicine

## 2019-07-18 NOTE — Telephone Encounter (Signed)
She does not have any refills; [facility]-stated last refilled on June 26th. Last refilled in April x 3 (Apr, May and June).

## 2019-07-18 NOTE — Telephone Encounter (Signed)
Refill Request  Pt requesting a refill on her Oxycodone  [id number] - [place], [place] - [address]

## 2019-07-18 NOTE — Telephone Encounter (Signed)
Oxycodone is not on current med list. Last refilled per hx - 04/16/19 Rxs x 3. Next appt scheduled 8/27 with Dr [name]. UDS - 06/16/19.

## 2019-07-18 NOTE — Telephone Encounter (Signed)
I filled 3 months on April 25.  She should have another refill.

## 2019-07-19 ENCOUNTER — Other Ambulatory Visit: Payer: Self-pay

## 2019-07-19 ENCOUNTER — Ambulatory Visit (HOSPITAL_COMMUNITY)
Admission: RE | Admit: 2019-07-19 | Discharge: 2019-07-19 | Disposition: A | Discharge status: Home / Self Care | Payer: Medicare Other | Source: Ambulatory Visit | Attending: Internal Medicine | Admitting: Internal Medicine

## 2019-07-19 ENCOUNTER — Ambulatory Visit (INDEPENDENT_AMBULATORY_CARE_PROVIDER_SITE_OTHER): Payer: Medicare Other | Admitting: Licensed Clinical Social Worker

## 2019-07-19 ENCOUNTER — Telehealth: Payer: Self-pay | Admitting: Pharmacist

## 2019-07-19 DIAGNOSIS — F331 Major depressive disorder, recurrent, moderate: Secondary | ICD-10-CM

## 2019-07-19 DIAGNOSIS — IMO0002 Reserved for concepts with insufficient information to code with codable children: Secondary | ICD-10-CM

## 2019-07-19 DIAGNOSIS — N390 Urinary tract infection, site not specified: Secondary | ICD-10-CM | POA: Diagnosis not present

## 2019-07-19 DIAGNOSIS — E1151 Type 2 diabetes mellitus with diabetic peripheral angiopathy without gangrene: Secondary | ICD-10-CM

## 2019-07-19 MED ORDER — OXYCODONE-ACETAMINOPHEN 5-325 MG PO TABS: 1.0000 | ORAL_TABLET | Freq: Three times a day (TID) | ORAL | 0 refills | Status: DC | PRN

## 2019-07-19 MED ORDER — OZEMPIC (0.25 OR 0.5 MG/DOSE) 2 MG/1.5ML ~~LOC~~ SOPN: 0.5000 mg | PEN_INJECTOR | SUBCUTANEOUS | 3 refills | Status: DC

## 2019-07-19 MED ORDER — TRESIBA FLEXTOUCH 200 UNIT/ML ~~LOC~~ SOPN: 70.0000 [IU] | PEN_INJECTOR | Freq: Every day | SUBCUTANEOUS | 3 refills | Status: DC

## 2019-07-19 NOTE — Telephone Encounter (Signed)
Pt states oxycodone is not at the pharmacy. Please call pt back.

## 2019-07-19 NOTE — Telephone Encounter (Signed)
Called pharmacy, 3 scripts 4/25 were filled: 4/25 5/26 6/25 Pt is requesting refill asap

## 2019-07-19 NOTE — Progress Notes (Signed)
Contacted patient for DM medication management follow up. Patient reports currently taking semaglutide 0.5 mg weekly, insulin degludec 70 units daily, and metformin 500 mg daily. Home BG fasting levels reported as low 100s. Her reading was in the 70 this morning and she treated with candied raisins. Patient reports no levels < 70. Education was provided on hypoglycemia management.  Was planning to offer switching semaglutide to dulaglutide due to patient experiencing some GI upset with semaglutide. However, patient states the symptoms have subsided and she prefers to continue on semaglutide. She requested prescription transfer to [facility] pharmacy. Prescription sent and patient advised to contact [facility] if further questions arise. Patient verbalized understanding.

## 2019-07-19 NOTE — BH Specialist Note (Signed)
Integrated Behavioral Health Follow Up Visit  MRN: [id number] Name: Jennifer [name]  Number of [facility] Clinician visits: 9 Session Start time: 10:48  Session End time: 11:05 Total time: 17 minutes  Type of Service: Integrated Behavioral Health- Individual Interpretor:No.   SUBJECTIVE: Jennifer [name] is a 58 y.o. female  whom attended the session individually.  Patient was referred by Dr. [name] for depression. Patient reports the following symptoms/concerns: improvement in mood since the last session, history of recurrent depression. Ongoing health challenges.  Duration of problem: recurrent for two years; Severity of problem: mild  OBJECTIVE: Mood: Positive and Affect: Appropriate Risk of harm to self or others: No plan to harm self or others  LIFE CONTEXT: Family and Social: Patient currently living with her younger son in her niece's home. Patient recently learned she can stay longer term in this family home, and processed how relieved she is to not have to move again. Patient has worked through her previous issues with her father, and is not speaking to him. Patient has decided not to be consumed with her oldest son's negative behaviors.  Self-Care: Patient is trying to improve her self-care. Life Changes: Stable housing since the last session.   GOALS ADDRESSED: Patient will: 1.  Reduce symptoms of: anxiety, depression and stress  2.  Increase knowledge and/or ability of: coping skills, healthy habits and stress reduction  3.  Demonstrate ability to: Increase healthy adjustment to current life circumstances, Increase adequate support systems for patient/family and Increase motivation to adhere to plan of care  INTERVENTIONS: Interventions utilized:  Solution-Focused Strategies and Supportive Counseling Standardized Assessments completed: assessed for SI, HI, and self-harm.  ASSESSMENT: Patient currently experiencing a remission phase from her  depression symptoms. Patient did not report any depression symptoms today, and identified that she has maintained an improved mood for over one month now. Patient feels that finding stable housing, and working on her relationship with her father has assisted her in improving her overall mood. Patient continues to have health challenges that can trigger her anxiety, but patient does reach out for support when necessary.   Patient may benefit from bi-weekly therapy for support.  PLAN: 1. Follow up with behavioral health clinician on : two weeks.   Dessie Coma, [facility], [place]

## 2019-07-20 ENCOUNTER — Encounter: Payer: Self-pay | Admitting: Licensed Clinical Social Worker

## 2019-07-22 ENCOUNTER — Ambulatory Visit (INDEPENDENT_AMBULATORY_CARE_PROVIDER_SITE_OTHER): Payer: Medicare Other | Admitting: Physician Assistant

## 2019-07-22 ENCOUNTER — Encounter: Payer: Self-pay | Admitting: Physician Assistant

## 2019-07-22 ENCOUNTER — Other Ambulatory Visit: Payer: Self-pay

## 2019-07-22 DIAGNOSIS — Z794 Long term (current) use of insulin: Secondary | ICD-10-CM

## 2019-07-22 DIAGNOSIS — Z89512 Acquired absence of left leg below knee: Secondary | ICD-10-CM

## 2019-07-22 DIAGNOSIS — M7541 Impingement syndrome of right shoulder: Secondary | ICD-10-CM | POA: Diagnosis not present

## 2019-07-22 DIAGNOSIS — E1142 Type 2 diabetes mellitus with diabetic polyneuropathy: Secondary | ICD-10-CM

## 2019-07-22 DIAGNOSIS — E440 Moderate protein-calorie malnutrition: Secondary | ICD-10-CM | POA: Diagnosis not present

## 2019-07-22 DIAGNOSIS — L97511 Non-pressure chronic ulcer of other part of right foot limited to breakdown of skin: Secondary | ICD-10-CM

## 2019-07-22 DIAGNOSIS — G8929 Other chronic pain: Secondary | ICD-10-CM

## 2019-07-22 DIAGNOSIS — M25511 Pain in right shoulder: Secondary | ICD-10-CM

## 2019-07-22 NOTE — Progress Notes (Signed)
Office Visit Note   Patient: Jennifer [name]           Date of Birth: September 14, 1961           MRN: [id number] Visit Date: 07/22/2019              Requested by: Bartholomew Crews, MD [address] [place],  [place] [address] PCP: Bartholomew Crews, MD  Chief Complaint  Patient presents with  . Right Foot - Follow-up  . Left Shoulder - Follow-up      HPI: The patient is a 58 year old woman who is seen for follow-up of several issues .  #1 right shoulder impingement syndrome the patient reports the steroid injection really did not help significantly.  She is continuing to have difficulty sleeping on the shoulder.  Continued difficulty with any movement especially when trying to don and doff her clothing. #2- right foot dorsal skin ulcer.  This is continued to heal well and is completely healed and she is wearing a regular sock and shoe now without problems. #3 left transtibial amputation-she reports that she is doing well with her revision transtibial amputation from April of this year although she does not feel the prosthetic fits quite as well.  She is going to work with [employer] for any adjustments. Assessment & Plan: Visit Diagnoses:  1. Impingement syndrome of right shoulder   2. Skin ulcer of right foot, limited to breakdown of skin ([place])   3. Acquired absence of left leg below knee (HCC)   4. Type 2 diabetes mellitus with diabetic polyneuropathy, with long-term current use of insulin ([place])   5. Moderate protein-calorie malnutrition ([place])   6. Chronic right shoulder pain     Plan: Will proceed with MRI of right shoulder for evaluation of impingement syndrome with poor response to steroid injection.  She is going to use some Voltaren gel to the shoulder for symptomatic relief as well. Follow up following MRI scan.   Follow-Up Instructions: Return in about 3 weeks (around 08/12/2019).   Ortho Exam  Patient is alert, oriented, no adenopathy, well-dressed, normal  affect, normal respiratory effort. The right dorsal foot wound has healed and there is only a very minor area of crusting here.  There is no signs of cellulitis or infection.  She has palpable pedal pulses. The left transtibial amputation site is well-healed and without areas of skin breakdown or pressure areas.  She does have her prosthesis and is able to don and doff this independently. Right shoulder range of motion is limited and she has only approximately 90 degrees of forward flexion and significant pain with internal and external rotation and abduction.  She has positive impingement symptoms.  She is neurovascularly intact distally. Imaging: No results found. No images are attached to the encounter.  Labs: Lab Results  Component Value Date   HGBA1C 9.8 (A) 05/18/2019   HGBA1C 9.3 (A) 02/22/2019   HGBA1C 8.2 (A) 10/21/2018   ESRSEDRATE 84 (H) 01/27/2013   ESRSEDRATE 65 (H) 01/26/2013   ESRSEDRATE 15 08/13/2009   CRP 31.2 (H) 01/27/2013   REPTSTATUS 07/02/2019 FINAL 06/30/2019   GRAMSTAIN  01/15/2017    RARE WBC PRESENT,BOTH PMN AND MONONUCLEAR NO ORGANISMS SEEN    CULT >=100,000 COLONIES/mL ESCHERICHIA COLI (A) 06/30/2019   LABORGA ESCHERICHIA COLI (A) 06/30/2019     Lab Results  Component Value Date   ALBUMIN 3.1 (L) 05/14/2019   ALBUMIN 3.2 (L) 05/13/2019   ALBUMIN 3.5 05/11/2019  Lab Results  Component Value Date   MG 1.8 01/09/2017   MG 1.9 11/07/2016   MG 1.8 05/16/2014   No results found for: VD25OH  No results found for: PREALBUMIN CBC EXTENDED Latest Ref Rng & Units 05/14/2019 05/13/2019 05/12/2019  WBC 4.0 - 10.5 K/uL 8.8 8.3 12.4(H)  RBC 3.87 - 5.11 MIL/uL 3.88 4.02 3.94  HGB 12.0 - 15.0 g/dL 12.1 12.6 12.3  HCT 36.0 - 46.0 % 36.7 37.3 37.9  PLT 150 - 400 K/uL 137(L) 149(L) 154  NEUTROABS 1.7 - 7.7 K/uL - - -  LYMPHSABS 0.7 - 4.0 K/uL - - -     There is no height or weight on file to calculate BMI.  Orders:  Orders Placed This Encounter    Procedures  . MR SHOULDER RIGHT WO CONTRAST   No orders of the defined types were placed in this encounter.    Procedures: No procedures performed  Clinical Data: No additional findings.  ROS:  All other systems negative, except as noted in the HPI. Review of Systems  Objective: Vital Signs: There were no vitals taken for this visit.  Specialty Comments:  No specialty comments available.  PMFS History: Patient Active Problem List   Diagnosis Date Noted  . Preauricular lymphadenopathy 07/13/2019  . Recurrent UTI 07/13/2019  . Skin rash 06/30/2019  . Cellulitis of right external cheek 06/30/2019  . Shoulder impingement, right 06/22/2019  . Vulvar cysts 01/27/2019  . Urinary incontinence 05/13/2018  . OSA (obstructive sleep apnea) 06/12/2017  . Morbid obesity with BMI of 40.0-44.9, adult ([place]) 03/26/2017  . Toe amputation status, right 01/16/2017  . Thrombocytopenia ([place]) 06/05/2016  . 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 Type 2 diabetes with nephropathy ([place]) 09/07/2015  . Uncontrolled diabetes mellitus with retinopathy, due to underlying condition, without macular edema ([place]) 09/05/2015  . Diabetic retinopathy ([place]) 09/05/2015  . Counseling regarding end of life decision making 06/14/2015  . Atherosclerosis of aorta ([place]) 04/04/2015  . Anemia 10/05/2014  . Chronic diastolic heart failure ([place])   . Tobacco abuse   . Severe obesity (BMI >= 40) ([place]) 03/02/2013  . Abnormality of gait 03/01/2013  . Healthcare maintenance 07/10/2012  . Chronic prescription opiate use 12/03/2011  . Secondary diabetes mellitus with peripheral vascular disease ([place]) 08/27/2011  . 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 ([place]) 11/29/2009  . Hypertension associated with diabetes ([place]) 11/29/2009  . Chronic insomnia 10/25/2009  . GASTROESOPHAGEAL REFLUX DISEASE 11/24/2008  . Depression, major, severe recurrence ([place]) 04/06/2008  . DM (diabetes mellitus) type II uncontrolled, periph vascular disorder  ([place]) 04/02/2007  . Hyperlipidemia associated with type 2 diabetes mellitus ([place]) 01/08/2007  . COPD (chronic obstructive pulmonary disease) ([place]) 01/08/2007   Past Medical History:  Diagnosis Date  . Anginal pain [facility])    '3' of 10 ischemia ruled out 9/9   . Arthritis of lumbar spine   . Asthma   . Cataract   . CHF (congestive heart failure) ([place])   . 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 Chronic bronchitis ([place])    "I get it alot" (09/28/2013)  . Chronic diastolic heart failure (HCC)    grade 2 per 2D echocardiogram (01/2013)  . Chronic lower back pain   . Chronic osteomyelitis of foot (HCC)    chronic, right secondary to diabetic foot ulcers  . Chronic pain syndrome 12/03/2011   Likely secondary to depression, "fibromyalgia", neuropathy, and obesity. Lumbar MRI 2014 no sig change from prior (2008) : Stable  hypertrophic facet disease most notable at L4-5. Stable shallow left foraminal/extraforaminal disc protrusion at L4-5. No direct neural compression.      Marland Kitchen COPD 01/08/2007   PFT's 05/2007 : FEV1/FVC 82, FEV1 64% pred, FEF 25-75% 40% predicted, 16% improvement in FEV1 with bronchodilators.     . Depression   . Diabetes mellitus without complication ([place])    Type II  . Diabetic peripheral neuropathy ([place])   . DVT of upper extremity (deep vein thrombosis) ([place]) 03/11/2013   Secondary to PICC line. Right brachial vein, diagnosed on 03/10/2013 Coumadin for 3 months. End date 06/10/2013   . Dyspnea    "smoker"  . Environmental allergies    Hx: of  . Exertional shortness of breath   . Fatty liver 2003   observed on ultrasound abdomen  . Fibromyalgia   . GERD (gastroesophageal reflux disease)   . Glaucoma   . History of use of hearing aid   . Hyperlipidemia   . Hyperplastic colon polyp 12/2010   Per colonoscopy (12/2010) - Dr. Deatra Ina  . Hypertension   . 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 Blood cultures  01/26/13 S. Aureus and GBS. Blood cultures Feb 6th, 8th, and 9th and March were negative.Repeat TEE 3/20 negative for vegitations  . 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 2/2 tobacco use, DM, and cont weight bearing on surgical wound and developed gangrene   . Pneumonia   . Polymicrobial bacterial infection 01/2013   GBS and S. aureus bacteremia // Source likely infected diabetic foot ulcer  . PVD (peripheral vascular disease) with claudication ([place])    Stents to bilateral common iliac arteries (left 2005, right 2008), on chronic plavix  . Rheumatoid arthritis ([place])   . S/P BKA (below knee amputation) unilateral [facility])    Oct 2014 L - failed limb preserving treatment. 2/2 tobacco use, DM, and cont weight bearing on surgical wound and developed gangrene   . Tobacco abuse   . Type II diabetes mellitus with peripheral circulatory disorders, uncontrolled DX: 1993   Insulin dep. Poor control. Complicated by diabetic foot ulcer and diabetic eye disease.    Marland Kitchen Ulcer of foot, chronic (HCC)    Left. No OM per MRI (01/2013)    Family History  Problem Relation Age of Onset  . Diverticulosis Mother   . Diabetes Mother   . Hypertension Mother   . Congestive Heart Failure Mother   . Asthma Father   . CAD Sister 44       MI at age 61 per patient.  However, she has not had a stent or CABG.   . Heart disease Sister        before age 54  . Breast cancer Neg Hx     Past Surgical History:  Procedure Laterality Date  . ABDOMINAL HYSTERECTOMY  1997   secondary to uterine fibroids  . AMPUTATION Left 08/31/2013   Procedure: AMPUTATION RAY;  Surgeon: Newt Minion, MD;  Location: [facility];  Service: Orthopedics;  Laterality: Left;  Left Foot 5th Ray Amputation  . AMPUTATION Left 09/28/2013   Procedure: Left Midfoot amputation;  Surgeon: Newt Minion, MD;  Location: [facility];  Service: Orthopedics;  Laterality: Left;  Left Midfoot amputation  .  AMPUTATION Left 10/14/2013   Procedure: AMPUTATION BELOW KNEE- left;  Surgeon: Newt Minion, MD;  Location: [facility];  Service: Orthopedics;  Laterality: Left;  Left Below Knee Amputation   . AMPUTATION TOE Right 01/15/2017   Procedure: AMPUTATION 5th TOE RIGHT FOOT;  Surgeon: Edrick Kins, DPM;  Location: [facility];  Service: Podiatry;  Laterality: Right;  . APPLICATION OF WOUND VAC  04/01/2019   Procedure: Application Of Wound Vac;  Surgeon: Newt Minion, MD;  Location: [facility];  Service: Orthopedics;;  . BLADDER SURGERY     bladder reconstruction surgery  . BREAST BIOPSY     multiple-benign per pt  . COLONOSCOPY    . ESOPHAGOGASTRODUODENOSCOPY N/A 09/20/2013   Procedure: ESOPHAGOGASTRODUODENOSCOPY (EGD);  Surgeon: Jerene Bears, MD;  Location: [facility];  Service: Gastroenterology;  Laterality: N/A;  . FOOT AMPUTATION THROUGH METATARSAL Left 09/28/2013  . GANGLION CYST EXCISION     multiple  . PERIPHERAL VASCULAR INTERVENTION     stents in lower ext  . SHOULDER ARTHROSCOPY W/ ROTATOR CUFF REPAIR Bilateral   . SKIN SPLIT GRAFT Bilateral 05/13/2013   Procedure: Right and Left Foot Allograft Skin Graft;  Surgeon: Newt Minion, MD;  Location: [facility];  Service: Orthopedics;  Laterality: Bilateral;  Right and Left Foot Allograft Skin Graft  . STUMP REVISION Left 04/01/2019   Procedure: REVISION LEFT BELOW KNEE AMPUTATION;  Surgeon: Newt Minion, MD;  Location: [facility];  Service: Orthopedics;  Laterality: Left;  . TEE WITHOUT CARDIOVERSION N/A 01/31/2013   Procedure: TRANSESOPHAGEAL ECHOCARDIOGRAM (TEE);  Surgeon: Fay Records, MD;  Location: [facility];  Service: Cardiovascular;  Laterality: N/A;  Rm [phone]  . TEE WITHOUT CARDIOVERSION N/A 03/10/2013   Procedure: TRANSESOPHAGEAL ECHOCARDIOGRAM (TEE);  Surgeon: Larey Dresser, MD;  Location: [facility];  Service: Cardiovascular;  Laterality: N/A;  Rm. [phone]  . TOE AMPUTATION Left 08/31/2013   4TH & 5 TH TOE   . TONSILLECTOMY    . TUBAL LIGATION      . WRIST SURGERY Right    "for tumors" (09/28/2013)   Social History   Occupational History  . Occupation: Disability    Comment: previously worked as a Engineer, materials  . Smoking status: Current Every Day Smoker    Packs/day: 1.00    Years: 50.00    Pack years: 50.00    Types: Cigarettes  . Smokeless tobacco: Never Used  . Tobacco comment: 1 PPD  Substance and Sexual Activity  . Alcohol use: No    Alcohol/week: 0.0 standard drinks  . Drug use: No    Types: Marijuana, "Crack" cocaine    Comment: 09/28/2013 "no marijuana since 2011, no crack/cocaine 1989"  . Sexual activity: Not Currently

## 2019-07-25 DIAGNOSIS — R590 Localized enlarged lymph nodes: Secondary | ICD-10-CM | POA: Diagnosis not present

## 2019-07-26 ENCOUNTER — Telehealth: Payer: Self-pay | Admitting: Orthopedic Surgery

## 2019-07-26 NOTE — Telephone Encounter (Signed)
Patient is a 58 year old woman who requires a light weight small with wheelchair to get through her hallways and doorways within her home.  Patient is 5 foot 7 weight 252 pounds.  Patient's current diagnosis and occludes impingement syndrome of her shoulder M 75.41, left transtibial amputation Z 89.512, ulcer of the right foot L 97.511, and diabetic peripheral neuropathy E 11.42.  With patient's medical condition she is unable to ambulate with and her home with assistive devices such as a rolling walker or cane or crutch.  Patient can safely propel a wheelchair but she cannot operate a wider wheelchair as it will not fit through the doorways within her house.

## 2019-08-02 ENCOUNTER — Telehealth: Payer: Self-pay | Admitting: Licensed Clinical Social Worker

## 2019-08-02 ENCOUNTER — Ambulatory Visit: Payer: Medicare Other | Admitting: Licensed Clinical Social Worker

## 2019-08-02 NOTE — Telephone Encounter (Signed)
Patient called in to move her appointment to next week due to challenges with transportation.

## 2019-08-03 ENCOUNTER — Telehealth: Payer: Self-pay | Admitting: Dietician

## 2019-08-03 NOTE — Telephone Encounter (Signed)
Yes if you can teach her

## 2019-08-03 NOTE — Telephone Encounter (Signed)
[name] calls saying "They only want to send her 1 box of 3 pens of her diabetes insulin and she knows she needs more than that". She needs 2100 units a month to take her prescribed 70 units/day. She says she has been "Taking a bit more". I asked her why she is taking more and she said to lower her sugar when it is high. I discouraged her from doing this with [place]. She is willing to take rapid acting correction insulin for high sugars if approved by Dr. [name].  Called [facility] pharmacy-delivery next week is for 2 boxes/6 pens which is enough. Called [name] and explained to her that her insulin is concentrated and therefore lasts longer. She reports her Blood sugar today is 329, after only having 12 oz sprite and 1 pack oodles of noodles and 70 units [place]. She scheduled and appointment to follow up on diabetes for 08/09/19.

## 2019-08-09 ENCOUNTER — Ambulatory Visit (INDEPENDENT_AMBULATORY_CARE_PROVIDER_SITE_OTHER): Payer: Medicare Other | Admitting: Licensed Clinical Social Worker

## 2019-08-09 ENCOUNTER — Other Ambulatory Visit: Payer: Self-pay

## 2019-08-09 ENCOUNTER — Ambulatory Visit: Payer: Medicare Other | Admitting: Dietician

## 2019-08-09 ENCOUNTER — Other Ambulatory Visit: Payer: Self-pay | Admitting: Internal Medicine

## 2019-08-09 ENCOUNTER — Encounter: Payer: Self-pay | Admitting: Dietician

## 2019-08-09 ENCOUNTER — Encounter: Payer: Self-pay | Admitting: Licensed Clinical Social Worker

## 2019-08-09 DIAGNOSIS — F331 Major depressive disorder, recurrent, moderate: Secondary | ICD-10-CM

## 2019-08-09 DIAGNOSIS — Z4781 Encounter for orthopedic aftercare following surgical amputation: Secondary | ICD-10-CM | POA: Diagnosis not present

## 2019-08-09 NOTE — Progress Notes (Signed)
Jennifer [name] is not feeling well and wants to reschedule this appointment. She reports her blood sugars are 190-200s and 300s on 70 units of Tresiba daily.  She agrees to adding meal time insulin at her appointment rescheduled for next Thursday.

## 2019-08-09 NOTE — BH Specialist Note (Signed)
Integrated Behavioral Health Visit via Telemedicine (Telephone)  08/09/2019 Jennifer [name] [id number]   Session Start time: 10:08  Session End time: 10:28 Total time: 20 minutes  Referring Provider: Dr. [name] Type of Visit: Telephonic Patient location: Home [facility] Provider location: Office All persons participating in visit: patient and [facility]  Confirmed patient's address: Yes  Confirmed patient's phone number: Yes  Any changes to demographics: No    Discussed confidentiality: Yes    The following statements were read to the patient and/or legal guardian that are established with the [facility] Provider.  "The purpose of this phone visit is to provide behavioral health care while limiting exposure to the coronavirus (COVID19).  There is a possibility of technology failure and discussed alternative modes of communication if that failure occurs."  "By engaging in this telephone visit, you consent to the provision of healthcare.  Additionally, you authorize for your insurance to be billed for the services provided during this telephone visit."   Patient and/or legal guardian consented to telephone visit: Yes   PRESENTING CONCERNS: Patient and/or family reports the following symptoms/concerns: mild depression, anxiety, environmental issues, health concerns, and financial issues.  Duration of problem: recurrent for two years; Severity of problem: mild   GOALS ADDRESSED: Patient will: 1.  Reduce symptoms of: agitation, anxiety, depression and stress  2.  Increase knowledge and/or ability of: coping skills, healthy habits and stress reduction  3.  Demonstrate ability to: Increase healthy adjustment to current life circumstances, Increase adequate support systems for patient/family and Increase motivation to adhere to plan of care  INTERVENTIONS: Interventions utilized:  Solution-Focused Strategies, Supportive Counseling and Link to [employer] Assessments  completed: assessed for SI, HI, and self-harm.  ASSESSMENT: Patient currently experiencing acute stress related to family conflict. Patient reported that negative comments were made, and her family believes she was responsible. Patient is anxious about how that might effect her housing situation.  Patient requested to be mailed a list of housing resources. Patient processed her loneliness and anger towards her family neglecting her. Patient reported that her sons will not take her clothes to the laundry mat. Patient reported she has clean clothes at the moment, but she feels her family does not make her needs a priority. Patient denies any thoughts of hurting herself or others.   Patient may benefit from outpatient counseling.  PLAN: 1. Follow up with behavioral health clinician on : one week in person.  2. Referral(s): [employer] Resources:  [place], [facility], [place]

## 2019-08-10 NOTE — Telephone Encounter (Signed)
The hydroxyzine was stopped at her May discharge.  Can you see if she is feeling it since the discharge?.  Thanks

## 2019-08-15 DIAGNOSIS — R590 Localized enlarged lymph nodes: Secondary | ICD-10-CM | POA: Diagnosis not present

## 2019-08-15 DIAGNOSIS — D37030 Neoplasm of uncertain behavior of the parotid salivary glands: Secondary | ICD-10-CM | POA: Diagnosis not present

## 2019-08-17 NOTE — Telephone Encounter (Signed)
She says she would like it back cause she is itching but she was going to talk to you tomorrow about it

## 2019-08-18 ENCOUNTER — Ambulatory Visit (HOSPITAL_COMMUNITY)
Admission: RE | Admit: 2019-08-18 | Discharge: 2019-08-18 | Disposition: A | Discharge status: Home / Self Care | Payer: Medicare Other | Source: Ambulatory Visit | Attending: Internal Medicine | Admitting: Internal Medicine

## 2019-08-18 ENCOUNTER — Ambulatory Visit (INDEPENDENT_AMBULATORY_CARE_PROVIDER_SITE_OTHER): Payer: Medicare Other | Admitting: Internal Medicine

## 2019-08-18 ENCOUNTER — Ambulatory Visit (INDEPENDENT_AMBULATORY_CARE_PROVIDER_SITE_OTHER): Payer: Medicare Other | Admitting: Licensed Clinical Social Worker

## 2019-08-18 ENCOUNTER — Encounter: Payer: Self-pay | Admitting: Dietician

## 2019-08-18 ENCOUNTER — Encounter: Payer: Self-pay | Admitting: Internal Medicine

## 2019-08-18 ENCOUNTER — Other Ambulatory Visit: Payer: Self-pay | Admitting: Otolaryngology

## 2019-08-18 ENCOUNTER — Other Ambulatory Visit: Payer: Self-pay

## 2019-08-18 ENCOUNTER — Ambulatory Visit (INDEPENDENT_AMBULATORY_CARE_PROVIDER_SITE_OTHER): Payer: Medicare Other | Admitting: Dietician

## 2019-08-18 VITALS — BP 136/69 | HR 71 | Temp 99.2°F | Wt 260.5 lb

## 2019-08-18 DIAGNOSIS — Z79899 Other long term (current) drug therapy: Secondary | ICD-10-CM

## 2019-08-18 DIAGNOSIS — E1151 Type 2 diabetes mellitus with diabetic peripheral angiopathy without gangrene: Secondary | ICD-10-CM

## 2019-08-18 DIAGNOSIS — Z794 Long term (current) use of insulin: Secondary | ICD-10-CM

## 2019-08-18 DIAGNOSIS — B372 Candidiasis of skin and nail: Secondary | ICD-10-CM

## 2019-08-18 DIAGNOSIS — M069 Rheumatoid arthritis, unspecified: Secondary | ICD-10-CM

## 2019-08-18 DIAGNOSIS — R591 Generalized enlarged lymph nodes: Secondary | ICD-10-CM

## 2019-08-18 DIAGNOSIS — Z89512 Acquired absence of left leg below knee: Secondary | ICD-10-CM

## 2019-08-18 DIAGNOSIS — M25542 Pain in joints of left hand: Secondary | ICD-10-CM | POA: Insufficient documentation

## 2019-08-18 DIAGNOSIS — M25541 Pain in joints of right hand: Secondary | ICD-10-CM

## 2019-08-18 DIAGNOSIS — E1121 Type 2 diabetes mellitus with diabetic nephropathy: Secondary | ICD-10-CM | POA: Diagnosis not present

## 2019-08-18 DIAGNOSIS — I10 Essential (primary) hypertension: Secondary | ICD-10-CM | POA: Diagnosis not present

## 2019-08-18 DIAGNOSIS — D37030 Neoplasm of uncertain behavior of the parotid salivary glands: Secondary | ICD-10-CM

## 2019-08-18 DIAGNOSIS — M1812 Unilateral primary osteoarthritis of first carpometacarpal joint, left hand: Secondary | ICD-10-CM | POA: Diagnosis not present

## 2019-08-18 DIAGNOSIS — IMO0002 Reserved for concepts with insufficient information to code with codable children: Secondary | ICD-10-CM

## 2019-08-18 DIAGNOSIS — R590 Localized enlarged lymph nodes: Secondary | ICD-10-CM

## 2019-08-18 DIAGNOSIS — R21 Rash and other nonspecific skin eruption: Secondary | ICD-10-CM

## 2019-08-18 DIAGNOSIS — Z79891 Long term (current) use of opiate analgesic: Secondary | ICD-10-CM

## 2019-08-18 DIAGNOSIS — M19041 Primary osteoarthritis, right hand: Secondary | ICD-10-CM | POA: Diagnosis not present

## 2019-08-18 DIAGNOSIS — Z713 Dietary counseling and surveillance: Secondary | ICD-10-CM | POA: Diagnosis not present

## 2019-08-18 DIAGNOSIS — F331 Major depressive disorder, recurrent, moderate: Secondary | ICD-10-CM

## 2019-08-18 LAB — POCT GLYCOSYLATED HEMOGLOBIN (HGB A1C): Hemoglobin A1C: 8.9 % — AB (ref 4.0–5.6)

## 2019-08-18 LAB — GLUCOSE, CAPILLARY: Glucose-Capillary: 119 mg/dL — ABNORMAL HIGH (ref 70–99)

## 2019-08-18 MED ORDER — KETOCONAZOLE 2 % EX CREA: 1.0000 "application " | TOPICAL_CREAM | Freq: Every day | CUTANEOUS | 0 refills | Status: DC

## 2019-08-18 MED ORDER — FLUCONAZOLE 150 MG PO TABS: 150.0000 mg | ORAL_TABLET | ORAL | 0 refills | Status: DC

## 2019-08-18 NOTE — Progress Notes (Signed)
Diabetes Self-Management Education  Visit Type:  Follow-up  Appt. Start Time: 1130 Appt. End Time: 1201  08/18/2019  [name], identified by name and date of birth, is a 58 y.o. female with a diagnosis of Diabetes:  Marland Kitchen  Type 2 ASSESSMENT  She  confirms she is taking 70 units of Tresiba daily with no ill affects and one day due to 515 blood sugar took an additional 80 units of Tresiba insulin with no detected hypoglycemia. Her CGM done a few months ago did show hypoglycemia that she was unaware of.   She and her doctor agree to add rapid acting insulin to correct high blood sugars to her current regimen. She thinks the preferred rapid acting insulin is Humalog. She complained of weight gain at today's visit. She was educated that controlling blood sugars with insulin rather than calorie restriction can increase weight.   TDD-70 units, est TDD 59-140 Correction factor= 1800/70= 1:25   Lab Results  Component Value Date   HGBA1C 8.9 (A) 08/18/2019   HGBA1C 9.8 (A) 05/18/2019   HGBA1C 9.3 (A) 02/22/2019   HGBA1C 8.2 (A) 10/21/2018   HGBA1C 8.8 04/08/2018     Diabetes Self-Management Education - 08/18/19 1300      Health Coping   How would you rate your overall health?  Good      Complications   Last HgB A1C per patient/outside source  8.9 %    Postprandial Blood glucose range (mg/dL)  >200    Number of hypoglycemic episodes per month  0    Number of hyperglycemic episodes per week  5    Can you tell when your blood sugar is high?  No    Have you had a dilated eye exam in the past 12 months?  No    Have you had a dental exam in the past 12 months?  No   not applicable- no teeth   Are you checking your feet?  Yes    How many days per week are you checking your feet?  7      Patient Education   Medications  Reviewed medication adjustment guidelines for hyperglycemia and sick days.      Individualized Goals (developed by patient)   Medications  take my medication as  prescribed      Outcomes   Program Status  Completed      Subsequent Visit   Since your last visit have you continued or begun to take your medications as prescribed?  Yes   takes extra at times when blood sugars are higher   Since your last visit, are you checking your blood glucose at least once a day?  Yes   she forgot meters today     Learning Objective:  Patient will have a greater understanding of diabetes self-management. Patient education plan is to attend individual and/or group sessions per assessed needs and concerns. Plan:   Patient Instructions  Here is a schedule for fast acting insulin to CORRECT a high blood sugar:   Check blood sugar BEFORE meals that are 5 hours apart If blood sugar is < 175 Do not take any correction insulin 176-225 inject 2 units 226-275 inject 4 units 276-325 inject 6 units 326-375 inject 8 units 376-425 inject 10 units 426-475 inject 12 units 476-525 inject 14 units and call office for further instructions  Expected Outcomes:  Demonstrated interest in learning. Expect positive outcomes  Education material provided: Diabetes Resources  If problems or questions, patient to  contact team via:  Phone  Future DSME appointment: - 2 wks  [name], RD 08/18/2019 1:21 PM.

## 2019-08-18 NOTE — Assessment & Plan Note (Signed)
Per pt, ENT ordered a CT scan and stated would need excised.

## 2019-08-18 NOTE — Patient Instructions (Signed)
Here is a schedule for fast acting insulin to CORRECT a high blood sugar:   Check blood sugar BEFORE meals that are 5 hours apart If blood sugar is < 175 Do not take any correction insulin 176-225 inject 2 units 226-275 inject 4 units 276-325 inject 6 units 326-375 inject 8 units 376-425 inject 10 units 426-475 inject 12 units 476-525 inject 14 units and call office for further instructions

## 2019-08-18 NOTE — Assessment & Plan Note (Signed)
She states when she was 58 years old, living in [place], that she was diagnosed with RA. She states it started after she fell out in her mom's kitchen. She would have to go in three or four times a year to lay in bed and have her legs elevated and have a needle stuck into her hip to drain out fluid. She wondered why she is not on medicines for  RA. She had a sed rate 2014 of.84 but she had osteomyelitis at that time. CRP also elevated at 31. Her rheumatoid factor was 21, barely elevated, in 2018.   PLAN : CCP B hand films

## 2019-08-18 NOTE — BH Specialist Note (Signed)
Integrated Behavioral Health Follow Up Visit  MRN: [id number] Name: Jennifer [name]  Session Start time: 1:05  Session End time: 1:45 Total time: 40 minutes  Type of Service: Integrated Behavioral Health- Individual Interpretor:No.   SUBJECTIVE: Jennifer [name] is a 58 y.o. female  whom attended the session individually.  Patient was referred by Dr. [name] for depression and anxiety. Patient reports the following symptoms/concerns: mild depression, anxiety, environmental issues, health concerns, and financial stressors.  Duration of problem:recurrent for two years ; Severity of problem: mild  OBJECTIVE: Mood: Netural and Affect: Appropriate Risk of harm to self or others: No plan to harm self or others  GOALS ADDRESSED: Patient will: 1.  Reduce symptoms of: agitation, anxiety, depression and stress  2.  Increase knowledge and/or ability of: coping skills, healthy habits, self-management skills and stress reduction  3.  Demonstrate ability to: Increase healthy adjustment to current life circumstances, Increase adequate support systems for patient/family, Increase motivation to adhere to plan of care and Improve medication compliance  INTERVENTIONS: Interventions utilized:  Motivational Interviewing, Brief CBT and Supportive Counseling Standardized Assessments completed: Not Needed  ASSESSMENT: Patient currently experiencing ongoing mild anxiety and depression. Patient has stable housing, but wants to continue to look for apartments due to family dynamics. Patient is fearful that her family can ask her to leave at anytime. The fear of being asked to leave her housing with limited income, causes anxiety for the patient. Patient continues to have pain and chronic health issues that present mental health challenges for her. Patient is trying to reduce conflict with others, and work on her communication/setting limits. Patient denies any thoughts of harming herself or others.   Patient  may benefit from counseling.   PLAN: 1. Follow up with behavioral health clinician on : two weeks.  Dessie Coma, [facility], [place]

## 2019-08-18 NOTE — Telephone Encounter (Signed)
Used ketoconazole cream and diflucan

## 2019-08-18 NOTE — Progress Notes (Signed)
   Subjective:    [name], female    DOB: 1961/11/19, 58 y.o.   MRN: [id number]  HPI  Jennifer [name] is here for rash, pain. Please see the A&P for the status of the pt's chronic medical problems.  ROS : per ROS section and in problem oriented charting. All other systems are negative.  PMHx includes diabetes insulin-dependent, hypertension, chronic opioid use, PVD, status post left BKA  Review of Systems  Musculoskeletal: Positive for arthralgias, back pain, gait problem, joint swelling and myalgias.  Skin: Positive for rash.  Hematological: Positive for adenopathy.  Psychiatric/Behavioral: Positive for sleep disturbance.       Objective:   Physical Exam Constitutional:      General: She is not in acute distress.    Appearance: Normal appearance. She is obese. She is not ill-appearing, toxic-appearing or diaphoretic.  HENT:     Head: Normocephalic and atraumatic.     Right Ear: External ear normal.     Left Ear: External ear normal.     Nose: Nose normal.  Eyes:     General: No scleral icterus.       Right eye: No discharge.        Left eye: No discharge.  Musculoskeletal:     Comments: No joint abnormalities of hand bilaterally.  Skin:    General: Skin is warm and dry.     Comments: Left skin fold under abdominal pannus shows scant white discharge in the skin fold only was surrounding shiny discolored area.  No satellite lesions.  No odor but that is through a face shield and the face mask.  Neurological:     General: No focal deficit present.     Mental Status: She is alert. Mental status is at baseline.  Psychiatric:        Mood and Affect: Mood normal.        Behavior: Behavior normal.        Thought Content: Thought content normal.        Judgment: Judgment normal.       Assessment & Plan:

## 2019-08-18 NOTE — Assessment & Plan Note (Signed)
This problem is chronic. 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 She is using nystatin powder two or three times a day without releif. She states there is a bad odor and it is itchy & painful. She states the cream she used to use did better. I will refill the ketoconazole cream and also try diflucan 150 milligrams weekly for four weeks.   Plan : ketoconazole cream Diflucan 150 weekly x 4 weeks

## 2019-08-18 NOTE — Patient Instructions (Signed)
1. I am doing testing for rheumatoid arthritis 2. Thanks for coming

## 2019-08-20 ENCOUNTER — Encounter: Payer: Self-pay | Admitting: Internal Medicine

## 2019-08-20 LAB — CYCLIC CITRUL PEPTIDE ANTIBODY, IGG/IGA: Cyclic Citrullin Peptide Ab: 5 units (ref 0–19)

## 2019-08-23 ENCOUNTER — Other Ambulatory Visit: Payer: Self-pay | Admitting: Otolaryngology

## 2019-08-23 DIAGNOSIS — D37030 Neoplasm of uncertain behavior of the parotid salivary glands: Secondary | ICD-10-CM

## 2019-08-24 ENCOUNTER — Ambulatory Visit (INDEPENDENT_AMBULATORY_CARE_PROVIDER_SITE_OTHER): Payer: Medicare Other | Admitting: Internal Medicine

## 2019-08-24 ENCOUNTER — Other Ambulatory Visit: Payer: Self-pay

## 2019-08-24 ENCOUNTER — Encounter: Payer: Self-pay | Admitting: Licensed Clinical Social Worker

## 2019-08-24 VITALS — BP 163/68 | HR 81 | Temp 98.6°F | Ht 67.0 in | Wt 261.0 lb

## 2019-08-24 DIAGNOSIS — T783XXA Angioneurotic edema, initial encounter: Secondary | ICD-10-CM

## 2019-08-24 MED ORDER — AMLODIPINE BESYLATE 10 MG PO TABS: 10.0000 mg | ORAL_TABLET | Freq: Every day | ORAL | 11 refills | Status: DC

## 2019-08-24 NOTE — Progress Notes (Signed)
CC: tongue swelling  HPI:  [name] is a 58 y.o. F with significant PMH as outlined below. Please see problem based charting for additional information.  Past Medical History:  Diagnosis Date  . Anginal pain [facility])    '3' of 10 ischemia ruled out 9/9   . Arthritis of lumbar spine   . Asthma   . Cataract   . CHF (congestive heart failure) ([place])   . 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 Chronic bronchitis ([place])    "I get it alot" (09/28/2013)  . Chronic diastolic heart failure (HCC)    grade 2 per 2D echocardiogram (01/2013)  . Chronic lower back pain   . Chronic osteomyelitis of foot (HCC)    chronic, right secondary to diabetic foot ulcers  . Chronic pain syndrome 12/03/2011   Likely secondary to depression, "fibromyalgia", neuropathy, and obesity. Lumbar MRI 2014 no sig change from prior (2008) : Stable hypertrophic facet disease most notable at L4-5. Stable shallow left foraminal/extraforaminal disc protrusion at L4-5. No direct neural compression.      Marland Kitchen COPD 01/08/2007   PFT's 05/2007 : FEV1/FVC 82, FEV1 64% pred, FEF 25-75% 40% predicted, 16% improvement in FEV1 with bronchodilators.     . Depression   . Diabetes mellitus without complication ([place])    Type II  . Diabetic peripheral neuropathy ([place])   . DVT of upper extremity (deep vein thrombosis) ([place]) 03/11/2013   Secondary to PICC line. Right brachial vein, diagnosed on 03/10/2013 Coumadin for 3 months. End date 06/10/2013   . Dyspnea    "smoker"  . Environmental allergies    Hx: of  . Exertional shortness of breath   . Fatty liver 2003   observed on ultrasound abdomen  . Fibromyalgia   . GERD (gastroesophageal reflux disease)   . Glaucoma   . History of use of hearing aid   . Hyperlipidemia   . Hyperplastic colon polyp 12/2010   Per colonoscopy (12/2010) - Dr. Deatra Ina  . Hypertension   . 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 Blood  cultures 01/26/13 S. Aureus and GBS. Blood cultures Feb 6th, 8th, and 9th and March were negative.Repeat TEE 3/20 negative for vegitations  . 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 2/2 tobacco use, DM, and cont weight bearing on surgical wound and developed gangrene   . Pneumonia   . Polymicrobial bacterial infection 01/2013   GBS and S. aureus bacteremia // Source likely infected diabetic foot ulcer  . PVD (peripheral vascular disease) with claudication ([place])    Stents to bilateral common iliac arteries (left 2005, right 2008), on chronic plavix  . Rheumatoid arthritis ([place])   . S/P BKA (below knee amputation) unilateral [facility])    Oct 2014 L - failed limb preserving treatment. 2/2 tobacco use, DM, and cont weight bearing on surgical wound and developed gangrene   . Tobacco abuse   . Type II diabetes mellitus with peripheral circulatory disorders, uncontrolled DX: 1993   Insulin dep. Poor control. Complicated by diabetic foot ulcer and diabetic eye disease.    Marland Kitchen Ulcer of foot, chronic (HCC)    Left. No OM per MRI (01/2013)   Review of Systems:   Review of Systems  Constitutional: Negative for fever.  HENT: Negative for congestion and sore throat.        Tongue swelling. Submandibular  tenderness.  Respiratory: Negative for cough and shortness of breath.   Cardiovascular: Negative for chest pain.  Musculoskeletal: Negative for joint pain.  Skin: Negative for itching and rash.   Physical Exam:  Vitals:   08/24/19 1416  BP: (!) 163/68  Pulse: 81  Temp: 98.6 F (37 C)  TempSrc: Oral  SpO2: 96%  Weight: 261 lb (118.4 kg)  Height: 5\' 7"  (1.702 m)   Physical Exam Vitals signs and nursing note reviewed.  Constitutional:      General: She is not in acute distress. HENT:     Head: Normocephalic and atraumatic.     Right Ear: Tympanic membrane and ear canal normal.     Left Ear: Tympanic membrane and ear canal  normal.     Nose: Nose normal.     Mouth/Throat:     Lips: No lesions.     Mouth: Mucous membranes are moist. No oral lesions or angioedema.     Tongue: No lesions. Tongue does not deviate from midline.     Palate: No lesions.     Pharynx: Oropharynx is clear. Uvula midline. No pharyngeal swelling, oropharyngeal exudate or posterior oropharyngeal erythema.     Comments: Edentulous. Cardiovascular:     Rate and Rhythm: Normal rate and regular rhythm.     Heart sounds: Normal heart sounds.  Pulmonary:     Effort: Pulmonary effort is normal.     Breath sounds: Normal breath sounds.  Abdominal:     General: Abdomen is flat. Bowel sounds are normal.  Neurological:     Mental Status: She is alert.    Assessment & Plan:   See Encounters Tab for problem based charting.  Patient seen with Dr. Angelia Mould

## 2019-08-24 NOTE — Assessment & Plan Note (Signed)
Pt presents today for episodes of tongue swelling. She has had two of these episodes over the last 2-3 months. The last episode was 4 days ago. Pt states she developed unilateral tongue swelling on the R side that did not improve with Benadryl. Endorses lip swelling, R sided discomfort in her bottom gums (feeling as though "there was a piece of food stuck or something" and submandibular tenderness at that time as well. Denies rash, skin changes, itchy throat, itchy or red eyes, or trouble swallowing or breathing. No recent changes to her diet or new foods. She began taking diflucan last week which was after the first episode of swelling a few months ago, and there have been no other medication changes.  Pt says that her mother had issue with swelling due to one of her blood pressure medications. She was not sure when medicine it was and does not recall if her mother was evaluated any further for the tongue swelling. Pt is on a combination amlodipine-benazepril pill for her blood pressure.  Assessment - Isolated angioedema likely secondary to ACE inhibitor use.  - instructed pt to discontinue amlodipine-benazepril medication - reordered amlodipine 60m to her mail-in pharmacy [facility] - will get C3 and C4, CBC, CMP, ESR, and CRP today

## 2019-08-24 NOTE — Patient Instructions (Addendum)
You were evaluated today for episodes of tongue swelling. It is possible this was caused by one component in your blood pressure pill (the benazepril). Please discard the Lotrel medication you have been taking. I have placed an order for a pill with just the amlodipine part to be mailed to you through the [facility] pharmacy. Begin taking that medication when it is delivered to you.   We will get some blood work from you today. I will call you if any of the results are abnormal.  Please go to the emergency room if you develop lips, tongue, or throat swelling that compromises your breathing.

## 2019-08-25 ENCOUNTER — Telehealth: Payer: Self-pay | Admitting: Internal Medicine

## 2019-08-25 DIAGNOSIS — E1121 Type 2 diabetes mellitus with diabetic nephropathy: Secondary | ICD-10-CM

## 2019-08-25 LAB — CMP14 + ANION GAP
ALT: 12 IU/L (ref 0–32)
AST: 16 IU/L (ref 0–40)
Albumin/Globulin Ratio: 1.4 (ref 1.2–2.2)
Albumin: 3.8 g/dL (ref 3.8–4.9)
Alkaline Phosphatase: 77 IU/L (ref 39–117)
Anion Gap: 15 mmol/L (ref 10.0–18.0)
BUN/Creatinine Ratio: 14 (ref 12–28)
BUN: 14 mg/dL (ref 8–27)
Bilirubin Total: <0.2 mg/dL (ref 0.0–1.2)
CO2: 24 mmol/L (ref 20–29)
Calcium: 9.1 mg/dL (ref 8.7–10.3)
Chloride: 105 mmol/L (ref 96–106)
Creatinine, Ser: 1.03 mg/dL — ABNORMAL HIGH (ref 0.57–1.00)
GFR calc Af Amer: 68 mL/min/{1.73_m2} (ref >59)
GFR calc non Af Amer: 59 mL/min/{1.73_m2} — ABNORMAL LOW (ref >59)
Globulin, Total: 2.7 g/dL (ref 1.5–4.5)
Glucose: 222 mg/dL — ABNORMAL HIGH (ref 65–99)
Potassium: 4.1 mmol/L (ref 3.5–5.2)
Sodium: 144 mmol/L (ref 134–144)
Total Protein: 6.5 g/dL (ref 6.0–8.5)

## 2019-08-25 LAB — CBC
Hematocrit: 37.9 % (ref 34.0–46.6)
Hemoglobin: 11.9 g/dL (ref 11.1–15.9)
MCH: 29.9 pg (ref 26.6–33.0)
MCHC: 31.4 g/dL — ABNORMAL LOW (ref 31.5–35.7)
MCV: 95 fL (ref 79–97)
Platelets: 178 10*3/uL (ref 150–450)
RBC: 3.98 x10E6/uL (ref 3.77–5.28)
RDW: 14.6 % (ref 11.7–15.4)
WBC: 9.4 10*3/uL (ref 3.4–10.8)

## 2019-08-25 LAB — C3 AND C4
Complement C3, Serum: 149 mg/dL (ref 82–167)
Complement C4, Serum: 35 mg/dL (ref 14–44)

## 2019-08-25 LAB — SEDIMENTATION RATE: Sed Rate: 70 mm/hr — ABNORMAL HIGH (ref 0–40)

## 2019-08-25 LAB — C-REACTIVE PROTEIN: CRP: 37 mg/L — ABNORMAL HIGH (ref 0–10)

## 2019-08-25 NOTE — Telephone Encounter (Signed)
Pt was told she was to start humalog and still does not have it, dont see on medlist but do see in note- donna plyler and dr Ronnald Ramp. Please order

## 2019-08-25 NOTE — Telephone Encounter (Signed)
Pt would like a nurse to call her back regarding her insulin [phone]

## 2019-08-26 MED ORDER — INSULIN LISPRO 100 UNIT/ML ~~LOC~~ SOLN: SUBCUTANEOUS | 3 refills | Status: DC

## 2019-08-26 NOTE — Telephone Encounter (Signed)
Reviewed chart. Ok to start correction insulin per PCP. Was seen by [name] on 8/27 and received education with the following instructions:  Check blood sugar BEFORE meals that are 5 hours apart If blood sugar is < 175 Do not take any correction insulin 176-225 inject 2 units 226-275 inject 4 units 276-325 inject 6 units 326-375 inject 8 units 376-425 inject 10 units 426-475 inject 12 units 476-525 inject 14 units and call office for further instructions  Prescription for Humalog sent to pharmacy. Patient has follow up scheduled with [name] on 9/10.

## 2019-08-30 ENCOUNTER — Ambulatory Visit
Admission: RE | Admit: 2019-08-30 | Discharge: 2019-08-30 | Disposition: A | Discharge status: Home / Self Care | Payer: Medicare Other | Source: Ambulatory Visit | Attending: Physician Assistant | Admitting: Physician Assistant

## 2019-08-30 ENCOUNTER — Other Ambulatory Visit: Payer: Self-pay

## 2019-08-30 DIAGNOSIS — M25511 Pain in right shoulder: Secondary | ICD-10-CM | POA: Diagnosis not present

## 2019-08-30 DIAGNOSIS — G8929 Other chronic pain: Secondary | ICD-10-CM

## 2019-08-30 DIAGNOSIS — M7541 Impingement syndrome of right shoulder: Secondary | ICD-10-CM

## 2019-08-30 NOTE — Progress Notes (Signed)
Internal Medicine Clinic Attending  I saw and evaluated the patient.  I personally confirmed the key portions of the history and exam documented by Dr. Jones and I reviewed pertinent patient test results.  The assessment, diagnosis, and plan were formulated together and I agree with the documentation in the resident's note.     

## 2019-08-31 ENCOUNTER — Other Ambulatory Visit: Payer: Self-pay | Admitting: Internal Medicine

## 2019-08-31 DIAGNOSIS — N390 Urinary tract infection, site not specified: Secondary | ICD-10-CM | POA: Diagnosis not present

## 2019-09-01 ENCOUNTER — Other Ambulatory Visit: Payer: Self-pay

## 2019-09-01 ENCOUNTER — Ambulatory Visit: Payer: Medicare Other | Admitting: Dietician

## 2019-09-01 ENCOUNTER — Ambulatory Visit: Payer: Medicare Other | Admitting: Licensed Clinical Social Worker

## 2019-09-01 NOTE — Patient Instructions (Signed)
Please start your HUMALOG correction insulin with dinner tonight   you will only take HUMALOG when blood sugar is over 175  If you do not check your blood sugar please do not take Humalog.  Jennifer [name] [phone]

## 2019-09-01 NOTE — Telephone Encounter (Signed)
From urology

## 2019-09-02 ENCOUNTER — Other Ambulatory Visit: Payer: Self-pay | Admitting: Dietician

## 2019-09-02 ENCOUNTER — Encounter: Payer: Self-pay | Admitting: Dietician

## 2019-09-02 DIAGNOSIS — E1121 Type 2 diabetes mellitus with diabetic nephropathy: Secondary | ICD-10-CM

## 2019-09-02 MED ORDER — "INSULIN SYRINGE-NEEDLE U-100 31G X 15/64"" 0.3 ML MISC": 5 refills | Status: DC

## 2019-09-02 NOTE — Progress Notes (Signed)
Jennifer [name] was seen briefly today to follow up on how the correction sinulin was working. She has not started it yet because she has not gotten the new insulin. She plan to pick it up today.  Follow up rescheduled.  [name], RD 09/02/2019 5:16 PM.

## 2019-09-02 NOTE — Telephone Encounter (Signed)
Jennifer [name] is anxious to start taking Humalog correction insulin but when she got home from the pharmacy she saw that she had been given Humalog vials and does not have syringes. [facility] to [facility].  Request Humalog insulin pens and pen needles on refill.  [name], RD 09/02/2019 1:51 PM.

## 2019-09-03 MED ORDER — INSULIN PEN NEEDLE 32G X 4 MM MISC: 3 refills | Status: DC

## 2019-09-03 MED ORDER — INSULIN LISPRO (1 UNIT DIAL) 100 UNIT/ML (KWIKPEN): PEN_INJECTOR | SUBCUTANEOUS | 11 refills | Status: DC

## 2019-09-05 ENCOUNTER — Ambulatory Visit: Payer: Medicare Other | Admitting: Orthopedic Surgery

## 2019-09-07 ENCOUNTER — Other Ambulatory Visit: Payer: Self-pay | Admitting: Internal Medicine

## 2019-09-07 NOTE — Telephone Encounter (Signed)
hydrixyzine stopped last D/C and refused last month

## 2019-09-08 ENCOUNTER — Other Ambulatory Visit: Payer: Self-pay | Admitting: Internal Medicine

## 2019-09-08 ENCOUNTER — Other Ambulatory Visit: Payer: Self-pay | Admitting: *Deleted

## 2019-09-08 DIAGNOSIS — E1159 Type 2 diabetes mellitus with other circulatory complications: Secondary | ICD-10-CM

## 2019-09-08 DIAGNOSIS — I152 Hypertension secondary to endocrine disorders: Secondary | ICD-10-CM

## 2019-09-09 ENCOUNTER — Other Ambulatory Visit: Payer: Self-pay | Admitting: Internal Medicine

## 2019-09-09 NOTE — Telephone Encounter (Signed)
Her urologist prescribes this and she needs to contact him or her to ensure this is the correct dosage

## 2019-09-09 NOTE — Telephone Encounter (Signed)
[facility] pharmacy informed.

## 2019-09-12 ENCOUNTER — Other Ambulatory Visit: Payer: Self-pay

## 2019-09-12 ENCOUNTER — Encounter: Payer: Self-pay | Admitting: Orthopedic Surgery

## 2019-09-12 ENCOUNTER — Ambulatory Visit (INDEPENDENT_AMBULATORY_CARE_PROVIDER_SITE_OTHER): Payer: Medicare Other | Admitting: Orthopedic Surgery

## 2019-09-12 VITALS — Ht 67.0 in | Wt 261.0 lb

## 2019-09-12 DIAGNOSIS — M75121 Complete rotator cuff tear or rupture of right shoulder, not specified as traumatic: Secondary | ICD-10-CM | POA: Diagnosis not present

## 2019-09-12 NOTE — Patient Outreach (Signed)
[facility]) Care Management  09/12/2019  Jennifer [name] 1961/08/10 [id number]   Medication Adherence call to [name] HIPPA Compliant Voice message left with a call back number. [name] is showing past due on Amlodipine/Benazepril 10/40 mg under [place].   Jennifer [name] Management Direct Dial [phone]  Fax [phone] [email or web address]

## 2019-09-12 NOTE — Progress Notes (Signed)
Office Visit Note   Patient: Jennifer [name]           Date of Birth: 07-23-1961           MRN: [id number] Visit Date: 09/12/2019              Requested by: Bartholomew Crews, MD [address] [place],  [place] [address] PCP: Bartholomew Crews, MD  Chief Complaint  Patient presents with  . Right Shoulder - Follow-up    MRI review       HPI: Patient is a 58 year old woman with pain in her right shoulder she is status post an MRI scan.  Patient states she cannot get her hand to the top of her head.  She has pain with activities of daily living.  Assessment & Plan: Visit Diagnoses:  1. Nontraumatic complete tear of right rotator cuff     Plan: Recommended proceeding with arthroscopic debridement of the right shoulder rotator cuff tear.  Risk and benefits were discussed including persistent pain and decreased range of motion potential for infection need for additional surgery.  Patient states she understands wished to proceed at this time.  Follow-Up Instructions: Return Follow-up 1 week after surgery.   Ortho Exam  Patient is alert, oriented, no adenopathy, well-dressed, normal affect, normal respiratory effort. Examination patient has active abduction flexion of 45 degrees of the right shoulder passively I can get her up to about 90 degrees of passive abduction of flexion.  She only has 20 degrees of internal and external rotation.  Patient also has sleep apnea does not use a CPAP machine.  Review of the MRI scan shows a full-thickness tear of the supraspinatus tendon as well as infraspinatus partial-thickness tearing glenohumeral degenerative arthritis as well as atrophy of the teres minor.  Imaging: No results found. No images are attached to the encounter.  Labs: Lab Results  Component Value Date   HGBA1C 8.9 (A) 08/18/2019   HGBA1C 9.8 (A) 05/18/2019   HGBA1C 9.3 (A) 02/22/2019   ESRSEDRATE 70 (H) 08/24/2019   ESRSEDRATE 84 (H) 01/27/2013   ESRSEDRATE  65 (H) 01/26/2013   CRP 37 (H) 08/24/2019   CRP 31.2 (H) 01/27/2013   REPTSTATUS 07/02/2019 FINAL 06/30/2019   GRAMSTAIN  01/15/2017    RARE WBC PRESENT,BOTH PMN AND MONONUCLEAR NO ORGANISMS SEEN    CULT >=100,000 COLONIES/mL ESCHERICHIA COLI (A) 06/30/2019   LABORGA ESCHERICHIA COLI (A) 06/30/2019     Lab Results  Component Value Date   ALBUMIN 3.8 08/24/2019   ALBUMIN 3.1 (L) 05/14/2019   ALBUMIN 3.2 (L) 05/13/2019    Lab Results  Component Value Date   MG 1.8 01/09/2017   MG 1.9 11/07/2016   MG 1.8 05/16/2014   No results found for: VD25OH  No results found for: PREALBUMIN CBC EXTENDED Latest Ref Rng & Units 08/24/2019 05/14/2019 05/13/2019  WBC 3.4 - 10.8 x10E3/uL 9.4 8.8 8.3  RBC 3.77 - 5.28 x10E6/uL 3.98 3.88 4.02  HGB 11.1 - 15.9 g/dL 11.9 12.1 12.6  HCT 34.0 - 46.6 % 37.9 36.7 37.3  PLT 150 - 450 x10E3/uL 178 137(L) 149(L)  NEUTROABS 1.7 - 7.7 K/uL - - -  LYMPHSABS 0.7 - 4.0 K/uL - - -     Body mass index is 40.88 kg/m.  Orders:  No orders of the defined types were placed in this encounter.  No orders of the defined types were placed in this encounter.    Procedures: No procedures performed  Clinical  Data: No additional findings.  ROS:  All other systems negative, except as noted in the HPI. Review of Systems  Objective: Vital Signs: Ht 5\' 7"  (1.702 m)   Wt 261 lb (118.4 kg)   BMI 40.88 kg/m   Specialty Comments:  No specialty comments available.  PMFS History: Patient Active Problem List   Diagnosis Date Noted  . Angio-edema 08/24/2019  . Preauricular lymphadenopathy 07/13/2019  . Recurrent UTI 07/13/2019  . Skin rash 06/30/2019  . Cellulitis of right external cheek 06/30/2019  . Shoulder impingement, right 06/22/2019  . Vulvar cysts 01/27/2019  . Urinary incontinence 05/13/2018  . OSA (obstructive sleep apnea) 06/12/2017  . Morbid obesity with BMI of 40.0-44.9, adult ([place]) 03/26/2017  . Toe amputation status, right 01/16/2017  .  Thrombocytopenia ([place]) 06/05/2016  . 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 Type 2 diabetes with nephropathy ([place]) 09/07/2015  . Uncontrolled diabetes mellitus with retinopathy, due to underlying condition, without macular edema ([place]) 09/05/2015  . Diabetic retinopathy ([place]) 09/05/2015  . Counseling regarding end of life decision making 06/14/2015  . Atherosclerosis of aorta ([place]) 04/04/2015  . Anemia 10/05/2014  . Chronic diastolic heart failure ([place])   . Tobacco abuse   . Severe obesity (BMI >= 40) ([place]) 03/02/2013  . Abnormality of gait 03/01/2013  . Healthcare maintenance 07/10/2012  . Chronic prescription opiate use 12/03/2011  . Secondary diabetes mellitus with peripheral vascular disease ([place]) 08/27/2011  . 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 Hypertension associated with diabetes ([place]) 11/29/2009  . Chronic insomnia 10/25/2009  . GASTROESOPHAGEAL REFLUX DISEASE 11/24/2008  . Depression, major, severe recurrence ([place]) 04/06/2008  . DM (diabetes mellitus) type II uncontrolled, periph vascular disorder ([place]) 04/02/2007  . Hyperlipidemia associated with type 2 diabetes mellitus ([place]) 01/08/2007  . COPD (chronic obstructive pulmonary disease) ([place]) 01/08/2007   Past Medical History:  Diagnosis Date  . Anginal pain [facility])    '3' of 10 ischemia ruled out 9/9   . Arthritis of lumbar spine   . Asthma   . Cataract   . CHF (congestive heart failure) ([place])   . 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 Chronic bronchitis ([place])    "I get it alot" (09/28/2013)  . Chronic diastolic heart failure (HCC)    grade 2 per 2D echocardiogram (01/2013)  . Chronic lower back pain   . Chronic osteomyelitis of foot (HCC)    chronic, right secondary to diabetic foot ulcers  . Chronic pain syndrome 12/03/2011   Likely secondary to depression, "fibromyalgia", neuropathy, and obesity. Lumbar MRI 2014 no sig change from prior (2008) : Stable hypertrophic facet disease most notable at  L4-5. Stable shallow left foraminal/extraforaminal disc protrusion at L4-5. No direct neural compression.      Marland Kitchen COPD 01/08/2007   PFT's 05/2007 : FEV1/FVC 82, FEV1 64% pred, FEF 25-75% 40% predicted, 16% improvement in FEV1 with bronchodilators.     . Depression   . Diabetes mellitus without complication ([place])    Type II  . Diabetic peripheral neuropathy ([place])   . DVT of upper extremity (deep vein thrombosis) ([place]) 03/11/2013   Secondary to PICC line. Right brachial vein, diagnosed on 03/10/2013 Coumadin for 3 months. End date 06/10/2013   . Dyspnea    "smoker"  . Environmental allergies    Hx: of  . Exertional shortness of breath   . Fatty liver 2003   observed on ultrasound abdomen  . Fibromyalgia   . GERD (gastroesophageal reflux  disease)   . Glaucoma   . History of use of hearing aid   . Hyperlipidemia   . Hyperplastic colon polyp 12/2010   Per colonoscopy (12/2010) - Dr. Deatra Ina  . Hypertension   . 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 Blood cultures 01/26/13 S. Aureus and GBS. Blood cultures Feb 6th, 8th, and 9th and March were negative.Repeat TEE 3/20 negative for vegitations  . 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 2/2 tobacco use, DM, and cont weight bearing on surgical wound and developed gangrene   . Pneumonia   . Polymicrobial bacterial infection 01/2013   GBS and S. aureus bacteremia // Source likely infected diabetic foot ulcer  . PVD (peripheral vascular disease) with claudication ([place])    Stents to bilateral common iliac arteries (left 2005, right 2008), on chronic plavix  . Rheumatoid arthritis ([place])   . S/P BKA (below knee amputation) unilateral [facility])    Oct 2014 L - failed limb preserving treatment. 2/2 tobacco use, DM, and cont weight bearing on surgical wound and developed gangrene   . Tobacco abuse   . Type II diabetes mellitus with  peripheral circulatory disorders, uncontrolled DX: 1993   Insulin dep. Poor control. Complicated by diabetic foot ulcer and diabetic eye disease.    Marland Kitchen Ulcer of foot, chronic (HCC)    Left. No OM per MRI (01/2013)    Family History  Problem Relation Age of Onset  . Diverticulosis Mother   . Diabetes Mother   . Hypertension Mother   . Congestive Heart Failure Mother   . Asthma Father   . CAD Sister 24       MI at age 59 per patient.  However, she has not had a stent or CABG.   . Heart disease Sister        before age 23  . Breast cancer Neg Hx     Past Surgical History:  Procedure Laterality Date  . ABDOMINAL HYSTERECTOMY  1997   secondary to uterine fibroids  . AMPUTATION Left 08/31/2013   Procedure: AMPUTATION RAY;  Surgeon: Newt Minion, MD;  Location: [place];  Service: Orthopedics;  Laterality: Left;  Left Foot 5th Ray Amputation  . AMPUTATION Left 09/28/2013   Procedure: Left Midfoot amputation;  Surgeon: Newt Minion, MD;  Location: [place];  Service: Orthopedics;  Laterality: Left;  Left Midfoot amputation  . AMPUTATION Left 10/14/2013   Procedure: AMPUTATION BELOW KNEE- left;  Surgeon: Newt Minion, MD;  Location: [place];  Service: Orthopedics;  Laterality: Left;  Left Below Knee Amputation   . AMPUTATION TOE Right 01/15/2017   Procedure: AMPUTATION 5th TOE RIGHT FOOT;  Surgeon: Edrick Kins, DPM;  Location: [place];  Service: Podiatry;  Laterality: Right;  . APPLICATION OF WOUND VAC  04/01/2019   Procedure: Application Of Wound Vac;  Surgeon: Newt Minion, MD;  Location: [place];  Service: Orthopedics;;  . BLADDER SURGERY     bladder reconstruction surgery  . BREAST BIOPSY     multiple-benign per pt  . COLONOSCOPY    . ESOPHAGOGASTRODUODENOSCOPY N/A 09/20/2013   Procedure: ESOPHAGOGASTRODUODENOSCOPY (EGD);  Surgeon: Jerene Bears, MD;  Location: [place];  Service: Gastroenterology;  Laterality: N/A;  . FOOT AMPUTATION THROUGH METATARSAL Left 09/28/2013  . GANGLION CYST  EXCISION     multiple  . PERIPHERAL VASCULAR INTERVENTION  stents in lower ext  . SHOULDER ARTHROSCOPY W/ ROTATOR CUFF REPAIR Bilateral   . SKIN SPLIT GRAFT Bilateral 05/13/2013   Procedure: Right and Left Foot Allograft Skin Graft;  Surgeon: Newt Minion, MD;  Location: [place];  Service: Orthopedics;  Laterality: Bilateral;  Right and Left Foot Allograft Skin Graft  . STUMP REVISION Left 04/01/2019   Procedure: REVISION LEFT BELOW KNEE AMPUTATION;  Surgeon: Newt Minion, MD;  Location: [place];  Service: Orthopedics;  Laterality: Left;  . TEE WITHOUT CARDIOVERSION N/A 01/31/2013   Procedure: TRANSESOPHAGEAL ECHOCARDIOGRAM (TEE);  Surgeon: Fay Records, MD;  Location: [place];  Service: Cardiovascular;  Laterality: N/A;  Rm [phone]  . TEE WITHOUT CARDIOVERSION N/A 03/10/2013   Procedure: TRANSESOPHAGEAL ECHOCARDIOGRAM (TEE);  Surgeon: Larey Dresser, MD;  Location: [place];  Service: Cardiovascular;  Laterality: N/A;  Rm. [phone]  . TOE AMPUTATION Left 08/31/2013   4TH & 5 TH TOE   . TONSILLECTOMY    . TUBAL LIGATION    . WRIST SURGERY Right    "for tumors" (09/28/2013)   Social History   Occupational History  . Occupation: Disability    Comment: previously worked as a Engineer, materials  . Smoking status: Current Every Day Smoker    Packs/day: 1.00    Years: 50.00    Pack years: 50.00    Types: Cigarettes  . Smokeless tobacco: Never Used  . Tobacco comment: 1 PPD  Substance and Sexual Activity  . Alcohol use: No    Alcohol/week: 0.0 standard drinks  . Drug use: No    Types: Marijuana, "Crack" cocaine    Comment: 09/28/2013 "no marijuana since 2011, no crack/cocaine 1989"  . Sexual activity: Not Currently

## 2019-09-15 ENCOUNTER — Ambulatory Visit: Payer: Medicare Other | Admitting: Dietician

## 2019-09-15 ENCOUNTER — Ambulatory Visit: Payer: Medicare Other | Admitting: Licensed Clinical Social Worker

## 2019-09-15 ENCOUNTER — Telehealth: Payer: Self-pay | Admitting: Dietician

## 2019-09-15 NOTE — Telephone Encounter (Signed)
Ms. Picou called and cancelled both her appointments for today with [facility] and behavioral counselor

## 2019-09-16 ENCOUNTER — Other Ambulatory Visit: Payer: Self-pay | Admitting: Internal Medicine

## 2019-09-17 ENCOUNTER — Ambulatory Visit
Admission: RE | Admit: 2019-09-17 | Discharge: 2019-09-17 | Disposition: A | Discharge status: Home / Self Care | Payer: Medicare Other | Source: Ambulatory Visit | Attending: Otolaryngology | Admitting: Otolaryngology

## 2019-09-17 ENCOUNTER — Other Ambulatory Visit: Payer: Self-pay

## 2019-09-17 DIAGNOSIS — D37030 Neoplasm of uncertain behavior of the parotid salivary glands: Secondary | ICD-10-CM

## 2019-09-17 MED ORDER — GADOBENATE DIMEGLUMINE 529 MG/ML IV SOLN
20.0000 mL | Freq: Once | INTRAVENOUS | Status: AC | PRN
  Administered 2019-09-17: 20 mL via INTRAVENOUS

## 2019-09-28 ENCOUNTER — Telehealth: Payer: Self-pay | Admitting: Internal Medicine

## 2019-09-28 ENCOUNTER — Telehealth: Payer: Self-pay | Admitting: *Deleted

## 2019-09-28 NOTE — Telephone Encounter (Signed)
Ms. 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  He called me to discuss her care.  It is a benign cyst and he does not feel that there is a medical necessity to remove surgically.  It would require dissecting out the facial nerve.  He caledl me because Ms. Trudgeon would like it removed because it is bothering her.  He asked if she could be medically cleared to undergo general anesthesia and I gave him my opinion that I would not clear her for an elective procedure.  She has uncontrolled diabetes, uncontrolled mental health issues, and uncontrolled blood pressure.  She also has some renal failure.  As this is a elective procedure, I do not feel I can medically clear her to undergo general anesthesia.  I told Dr. Lucia Gaskins but I would set up a telephone appointment with her the second half of the month so that I can explain my reasoning.  I am sending to the front desk to set up a telehealth appointment with me on Friday the 16th or Monday the 19th.  This can be in person if she prefers.

## 2019-09-28 NOTE — Telephone Encounter (Signed)
Done

## 2019-09-28 NOTE — Telephone Encounter (Signed)
Dr Lucia Gaskins calls to speak w/ dr [name], gave him her office and cell#

## 2019-09-30 ENCOUNTER — Telehealth: Payer: Self-pay | Admitting: Internal Medicine

## 2019-09-30 NOTE — Telephone Encounter (Signed)
Pt is requesting a nurse to callback regarding medicine [phone]

## 2019-09-30 NOTE — Telephone Encounter (Signed)
Hydroxyzine was stopped on her May 14, 2019 hospital discharge

## 2019-09-30 NOTE — Telephone Encounter (Signed)
Returned call to patient. States Pharmacy told her that PCP denied nystatin powder and hydroxyzine and she would like to know why. Explained nystatin powder was sent to [facility] on 09/07/2019. Will forward to PCP regarding hydroxyzine. Patient states she takes this 2-3 times daily for itching. Confirmed with [facility] that they received Rx for nystatin but they already sent that to patient and are requesting another refill.

## 2019-10-03 NOTE — Telephone Encounter (Signed)
Spoke with the patient this morning and she is available for her Tele Health Visit this Friday 10/07/2019.  Can she be scheduled on the [facility] schedule for this Visit, or does she need to be on Dr. Zenovia Jarred?   Pt is aware we will call her back with the time.

## 2019-10-03 NOTE — Telephone Encounter (Signed)
Pt is requesting a callback pls contact [phone]

## 2019-10-05 ENCOUNTER — Other Ambulatory Visit: Payer: Self-pay | Admitting: Internal Medicine

## 2019-10-05 DIAGNOSIS — E1159 Type 2 diabetes mellitus with other circulatory complications: Secondary | ICD-10-CM

## 2019-10-06 NOTE — Telephone Encounter (Signed)
Hydroxyzine and HCTZ have been discontinued in past encounters. The pregabalin was just filled at pharmacy 1 week ago, so seems too soon for this refill.

## 2019-10-07 ENCOUNTER — Encounter: Payer: Medicare Other | Admitting: Internal Medicine

## 2019-10-10 ENCOUNTER — Ambulatory Visit (INDEPENDENT_AMBULATORY_CARE_PROVIDER_SITE_OTHER): Payer: Medicare Other | Admitting: Internal Medicine

## 2019-10-10 ENCOUNTER — Other Ambulatory Visit: Payer: Self-pay

## 2019-10-10 DIAGNOSIS — R590 Localized enlarged lymph nodes: Secondary | ICD-10-CM

## 2019-10-10 MED ORDER — NYSTATIN 100000 UNIT/GM EX POWD: CUTANEOUS | 5 refills | Status: DC

## 2019-10-10 NOTE — Telephone Encounter (Signed)
Hydroxyzine was stopped in May at a discharge after admission for encephalopathy thought to be due to polypharmacy.  She can address her anxiety at her coming [facility] appointment.

## 2019-10-10 NOTE — Progress Notes (Signed)
   Subjective:  Jennifer [name] called [facility] for her telehealth appointment to discuss her ENT appointment. Please see the A&P for the status of the pt's chronic medical problems.  TELEHEALTH - AUDIO only   The visit was conducted with the patient located at home and Larey Dresser at [facility]. The patient's identity was confirmed using their DOB and current address. The patient has consented to being evaluated through a telephone encounter and understands the associated risks (an examination cannot be done and the patient may need to come in for an appointment) / benefits (allows the patient to remain at home, decreasing exposure to coronavirus). I personally spent 6 minutes on medical discussion.   ROS : per ROS section and in problem oriented charting. All other systems are negative.  PMHx includes uncontrolled type 2 diabetes and hypertension along with tobacco use  Review of Systems Complains of pain in her jaw and ear.  Complains of cold symptoms today.    Objective:   Physical Exam  Telehealth appointment only    Assessment & Plan:

## 2019-10-10 NOTE — Assessment & Plan Note (Signed)
This problem is chronic.  [facility] referred Jennifer [name] to Dr. Lucia Gaskins due to left preauricular lymph nodes. Imaging at that time included an MRI which showed a 9 x 10 x 11 mm cyst within the anterior aspect of the superficial lobe of the right parotid gland.  It was presumed to be a benign cyst.  She was seen by Dr. Lucia Gaskins who called me at the time of her appointment and stated this is benign.  He recommended watchful waiting and that surgery would involve dissection of the left facial nerve.  She trusts him about surgery so he called me for my input regarding preop clearance.  I reported that I could not clear her for an elective surgery with uncontrolled hypertension, diabetes, and tobacco use.  We  scheduled a telehealth appointment between me and Jennifer [name] to discuss my reasoning.  Today, she states she wants the surgery as if she is in a lot of pain.  I discussed that due to her uncontrolled medical issues, she would be at high risk for poor wound healing, cardiac complications, and other complications.  She states she has had a lot of surgeries, even recently, and always healed well.  I informed her that we could work on getting her diabetes under control along with her hypertension and tobacco use and that might change my opinion as to clearing her for an elective procedure.  She was in agreement to frequent appointments to get these issues under control.  I am requesting the front desk schedule her ASAP with [facility] to work on hypertension, diabetes, and tobacco use.  PLAN : [facility] appt

## 2019-10-14 ENCOUNTER — Telehealth: Payer: Self-pay | Admitting: Internal Medicine

## 2019-10-14 MED ORDER — OXYCODONE-ACETAMINOPHEN 5-325 MG PO TABS: ORAL_TABLET | ORAL | 0 refills | Status: DC

## 2019-10-14 NOTE — Telephone Encounter (Signed)
Pt called back - beside pain med refill; she's requesting something for her cold. Thanks

## 2019-10-14 NOTE — Telephone Encounter (Signed)
Called pt- stated she needs a refill on Oxycodone; not on current med list. She stated the pharmacy told her, she can get a refill on the 26th.

## 2019-10-14 NOTE — Telephone Encounter (Signed)
Please schedule appointment with [name] on the day of my next appointment which is November 12.  Please refer to my October 19 appointment for details why she needs to see [name].

## 2019-10-14 NOTE — Telephone Encounter (Signed)
Needs refill on pain medicine  ;pt contact [phone]  [id number] - [place], [place] - [place].

## 2019-10-16 ENCOUNTER — Inpatient Hospital Stay (HOSPITAL_COMMUNITY)
Admission: EM | Admit: 2019-10-16 | Discharge: 2019-10-19 | DRG: 193 | Disposition: A | Discharge status: Home Health | Payer: Medicare Other | Attending: Internal Medicine | Admitting: Internal Medicine

## 2019-10-16 DIAGNOSIS — R509 Fever, unspecified: Secondary | ICD-10-CM | POA: Diagnosis not present

## 2019-10-16 DIAGNOSIS — E11319 Type 2 diabetes mellitus with unspecified diabetic retinopathy without macular edema: Secondary | ICD-10-CM | POA: Diagnosis present

## 2019-10-16 DIAGNOSIS — F332 Major depressive disorder, recurrent severe without psychotic features: Secondary | ICD-10-CM | POA: Diagnosis present

## 2019-10-16 DIAGNOSIS — J189 Pneumonia, unspecified organism: Secondary | ICD-10-CM

## 2019-10-16 DIAGNOSIS — R748 Abnormal levels of other serum enzymes: Secondary | ICD-10-CM | POA: Diagnosis not present

## 2019-10-16 DIAGNOSIS — Z825 Family history of asthma and other chronic lower respiratory diseases: Secondary | ICD-10-CM

## 2019-10-16 DIAGNOSIS — N182 Chronic kidney disease, stage 2 (mild): Secondary | ICD-10-CM | POA: Diagnosis present

## 2019-10-16 DIAGNOSIS — N183 Chronic kidney disease, stage 3 unspecified: Secondary | ICD-10-CM | POA: Diagnosis present

## 2019-10-16 DIAGNOSIS — E785 Hyperlipidemia, unspecified: Secondary | ICD-10-CM | POA: Diagnosis not present

## 2019-10-16 DIAGNOSIS — I872 Venous insufficiency (chronic) (peripheral): Secondary | ICD-10-CM | POA: Diagnosis present

## 2019-10-16 DIAGNOSIS — Z79899 Other long term (current) drug therapy: Secondary | ICD-10-CM

## 2019-10-16 DIAGNOSIS — D72829 Elevated white blood cell count, unspecified: Secondary | ICD-10-CM | POA: Diagnosis not present

## 2019-10-16 DIAGNOSIS — I5032 Chronic diastolic (congestive) heart failure: Secondary | ICD-10-CM | POA: Diagnosis present

## 2019-10-16 DIAGNOSIS — H42 Glaucoma in diseases classified elsewhere: Secondary | ICD-10-CM | POA: Diagnosis present

## 2019-10-16 DIAGNOSIS — E669 Obesity, unspecified: Secondary | ICD-10-CM | POA: Diagnosis present

## 2019-10-16 DIAGNOSIS — Z794 Long term (current) use of insulin: Secondary | ICD-10-CM

## 2019-10-16 DIAGNOSIS — Z8679 Personal history of other diseases of the circulatory system: Secondary | ICD-10-CM

## 2019-10-16 DIAGNOSIS — Z8701 Personal history of pneumonia (recurrent): Secondary | ICD-10-CM

## 2019-10-16 DIAGNOSIS — E1122 Type 2 diabetes mellitus with diabetic chronic kidney disease: Secondary | ICD-10-CM | POA: Diagnosis present

## 2019-10-16 DIAGNOSIS — Z885 Allergy status to narcotic agent status: Secondary | ICD-10-CM

## 2019-10-16 DIAGNOSIS — M797 Fibromyalgia: Secondary | ICD-10-CM | POA: Diagnosis present

## 2019-10-16 DIAGNOSIS — N179 Acute kidney failure, unspecified: Secondary | ICD-10-CM | POA: Diagnosis present

## 2019-10-16 DIAGNOSIS — F5104 Psychophysiologic insomnia: Secondary | ICD-10-CM | POA: Diagnosis present

## 2019-10-16 DIAGNOSIS — M47816 Spondylosis without myelopathy or radiculopathy, lumbar region: Secondary | ICD-10-CM | POA: Diagnosis present

## 2019-10-16 DIAGNOSIS — I110 Hypertensive heart disease with heart failure: Secondary | ICD-10-CM | POA: Diagnosis not present

## 2019-10-16 DIAGNOSIS — Z86718 Personal history of other venous thrombosis and embolism: Secondary | ICD-10-CM

## 2019-10-16 DIAGNOSIS — F324 Major depressive disorder, single episode, in partial remission: Secondary | ICD-10-CM | POA: Diagnosis present

## 2019-10-16 DIAGNOSIS — K219 Gastro-esophageal reflux disease without esophagitis: Secondary | ICD-10-CM | POA: Diagnosis not present

## 2019-10-16 DIAGNOSIS — E1139 Type 2 diabetes mellitus with other diabetic ophthalmic complication: Secondary | ICD-10-CM | POA: Diagnosis present

## 2019-10-16 DIAGNOSIS — R05 Cough: Secondary | ICD-10-CM | POA: Diagnosis not present

## 2019-10-16 DIAGNOSIS — J441 Chronic obstructive pulmonary disease with (acute) exacerbation: Secondary | ICD-10-CM | POA: Diagnosis not present

## 2019-10-16 DIAGNOSIS — F329 Major depressive disorder, single episode, unspecified: Secondary | ICD-10-CM | POA: Diagnosis not present

## 2019-10-16 DIAGNOSIS — E1165 Type 2 diabetes mellitus with hyperglycemia: Secondary | ICD-10-CM | POA: Diagnosis not present

## 2019-10-16 DIAGNOSIS — Z8739 Personal history of other diseases of the musculoskeletal system and connective tissue: Secondary | ICD-10-CM

## 2019-10-16 DIAGNOSIS — G4734 Idiopathic sleep related nonobstructive alveolar hypoventilation: Secondary | ICD-10-CM | POA: Diagnosis present

## 2019-10-16 DIAGNOSIS — J440 Chronic obstructive pulmonary disease with acute lower respiratory infection: Secondary | ICD-10-CM | POA: Diagnosis present

## 2019-10-16 DIAGNOSIS — E118 Type 2 diabetes mellitus with unspecified complications: Secondary | ICD-10-CM | POA: Diagnosis present

## 2019-10-16 DIAGNOSIS — I5033 Acute on chronic diastolic (congestive) heart failure: Secondary | ICD-10-CM | POA: Diagnosis not present

## 2019-10-16 DIAGNOSIS — I700 Atherosclerosis of aorta: Secondary | ICD-10-CM | POA: Diagnosis present

## 2019-10-16 DIAGNOSIS — Z89512 Acquired absence of left leg below knee: Secondary | ICD-10-CM

## 2019-10-16 DIAGNOSIS — I130 Hypertensive heart and chronic kidney disease with heart failure and stage 1 through stage 4 chronic kidney disease, or unspecified chronic kidney disease: Secondary | ICD-10-CM | POA: Diagnosis present

## 2019-10-16 DIAGNOSIS — G894 Chronic pain syndrome: Secondary | ICD-10-CM | POA: Diagnosis present

## 2019-10-16 DIAGNOSIS — M069 Rheumatoid arthritis, unspecified: Secondary | ICD-10-CM | POA: Diagnosis present

## 2019-10-16 DIAGNOSIS — J9601 Acute respiratory failure with hypoxia: Secondary | ICD-10-CM | POA: Diagnosis not present

## 2019-10-16 DIAGNOSIS — Z91041 Radiographic dye allergy status: Secondary | ICD-10-CM

## 2019-10-16 DIAGNOSIS — E1151 Type 2 diabetes mellitus with diabetic peripheral angiopathy without gangrene: Secondary | ICD-10-CM | POA: Diagnosis not present

## 2019-10-16 DIAGNOSIS — N390 Urinary tract infection, site not specified: Secondary | ICD-10-CM | POA: Diagnosis present

## 2019-10-16 DIAGNOSIS — K760 Fatty (change of) liver, not elsewhere classified: Secondary | ICD-10-CM | POA: Diagnosis present

## 2019-10-16 DIAGNOSIS — Z7951 Long term (current) use of inhaled steroids: Secondary | ICD-10-CM

## 2019-10-16 DIAGNOSIS — E11621 Type 2 diabetes mellitus with foot ulcer: Secondary | ICD-10-CM | POA: Diagnosis present

## 2019-10-16 DIAGNOSIS — Z23 Encounter for immunization: Secondary | ICD-10-CM

## 2019-10-16 DIAGNOSIS — E119 Type 2 diabetes mellitus without complications: Secondary | ICD-10-CM | POA: Diagnosis not present

## 2019-10-16 DIAGNOSIS — G4733 Obstructive sleep apnea (adult) (pediatric): Secondary | ICD-10-CM | POA: Diagnosis not present

## 2019-10-16 DIAGNOSIS — Z89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): Secondary | ICD-10-CM

## 2019-10-16 DIAGNOSIS — N1831 Chronic kidney disease, stage 3a: Secondary | ICD-10-CM | POA: Diagnosis present

## 2019-10-16 DIAGNOSIS — Z89511 Acquired absence of right leg below knee: Secondary | ICD-10-CM | POA: Diagnosis not present

## 2019-10-16 DIAGNOSIS — Z20828 Contact with and (suspected) exposure to other viral communicable diseases: Secondary | ICD-10-CM | POA: Diagnosis present

## 2019-10-16 DIAGNOSIS — Z993 Dependence on wheelchair: Secondary | ICD-10-CM

## 2019-10-16 DIAGNOSIS — Z95820 Peripheral vascular angioplasty status with implants and grafts: Secondary | ICD-10-CM

## 2019-10-16 DIAGNOSIS — Z8719 Personal history of other diseases of the digestive system: Secondary | ICD-10-CM

## 2019-10-16 DIAGNOSIS — Z792 Long term (current) use of antibiotics: Secondary | ICD-10-CM

## 2019-10-16 DIAGNOSIS — R0602 Shortness of breath: Secondary | ICD-10-CM | POA: Diagnosis not present

## 2019-10-16 DIAGNOSIS — Z8619 Personal history of other infectious and parasitic diseases: Secondary | ICD-10-CM

## 2019-10-16 DIAGNOSIS — Z90710 Acquired absence of both cervix and uterus: Secondary | ICD-10-CM

## 2019-10-16 DIAGNOSIS — E1169 Type 2 diabetes mellitus with other specified complication: Secondary | ICD-10-CM | POA: Diagnosis present

## 2019-10-16 DIAGNOSIS — Z833 Family history of diabetes mellitus: Secondary | ICD-10-CM

## 2019-10-16 DIAGNOSIS — F17210 Nicotine dependence, cigarettes, uncomplicated: Secondary | ICD-10-CM | POA: Diagnosis present

## 2019-10-16 DIAGNOSIS — Z79891 Long term (current) use of opiate analgesic: Secondary | ICD-10-CM

## 2019-10-16 DIAGNOSIS — Z8379 Family history of other diseases of the digestive system: Secondary | ICD-10-CM

## 2019-10-16 DIAGNOSIS — Z87440 Personal history of urinary (tract) infections: Secondary | ICD-10-CM

## 2019-10-16 DIAGNOSIS — Z888 Allergy status to other drugs, medicaments and biological substances status: Secondary | ICD-10-CM

## 2019-10-16 DIAGNOSIS — Z8249 Family history of ischemic heart disease and other diseases of the circulatory system: Secondary | ICD-10-CM

## 2019-10-16 DIAGNOSIS — Z6837 Body mass index (BMI) 37.0-37.9, adult: Secondary | ICD-10-CM

## 2019-10-16 DIAGNOSIS — E1142 Type 2 diabetes mellitus with diabetic polyneuropathy: Secondary | ICD-10-CM | POA: Diagnosis present

## 2019-10-16 NOTE — ED Triage Notes (Signed)
Pt coming from home after complaints of shob, productive cough,and fever (couldn't state what it had been at home but EMS got 100 degrees). Pt at 76% on RA, placed on nonrebreather and increased to 90% but pt began pulling it off due to nose being stopped up. Placed on simple mask, now at 94%. Hx of COPD and CHF. Rhonchi in lower fields. CBG 406, pt states she is normally in the upper 200s for baseline

## 2019-10-17 ENCOUNTER — Other Ambulatory Visit: Payer: Self-pay

## 2019-10-17 ENCOUNTER — Emergency Department (HOSPITAL_COMMUNITY): Payer: Medicare Other

## 2019-10-17 ENCOUNTER — Inpatient Hospital Stay (HOSPITAL_COMMUNITY): Payer: Medicare Other

## 2019-10-17 ENCOUNTER — Encounter (HOSPITAL_COMMUNITY): Payer: Self-pay | Admitting: Emergency Medicine

## 2019-10-17 DIAGNOSIS — E1122 Type 2 diabetes mellitus with diabetic chronic kidney disease: Secondary | ICD-10-CM | POA: Diagnosis not present

## 2019-10-17 DIAGNOSIS — J189 Pneumonia, unspecified organism: Principal | ICD-10-CM

## 2019-10-17 DIAGNOSIS — J441 Chronic obstructive pulmonary disease with (acute) exacerbation: Secondary | ICD-10-CM | POA: Diagnosis not present

## 2019-10-17 DIAGNOSIS — I872 Venous insufficiency (chronic) (peripheral): Secondary | ICD-10-CM | POA: Diagnosis present

## 2019-10-17 DIAGNOSIS — K219 Gastro-esophageal reflux disease without esophagitis: Secondary | ICD-10-CM

## 2019-10-17 DIAGNOSIS — N390 Urinary tract infection, site not specified: Secondary | ICD-10-CM | POA: Diagnosis not present

## 2019-10-17 DIAGNOSIS — F329 Major depressive disorder, single episode, unspecified: Secondary | ICD-10-CM

## 2019-10-17 DIAGNOSIS — E119 Type 2 diabetes mellitus without complications: Secondary | ICD-10-CM

## 2019-10-17 DIAGNOSIS — Z79899 Other long term (current) drug therapy: Secondary | ICD-10-CM

## 2019-10-17 DIAGNOSIS — Z794 Long term (current) use of insulin: Secondary | ICD-10-CM

## 2019-10-17 DIAGNOSIS — Z87440 Personal history of urinary (tract) infections: Secondary | ICD-10-CM

## 2019-10-17 DIAGNOSIS — N179 Acute kidney failure, unspecified: Secondary | ICD-10-CM | POA: Diagnosis present

## 2019-10-17 DIAGNOSIS — I5032 Chronic diastolic (congestive) heart failure: Secondary | ICD-10-CM | POA: Diagnosis present

## 2019-10-17 DIAGNOSIS — I130 Hypertensive heart and chronic kidney disease with heart failure and stage 1 through stage 4 chronic kidney disease, or unspecified chronic kidney disease: Secondary | ICD-10-CM | POA: Diagnosis present

## 2019-10-17 DIAGNOSIS — E1142 Type 2 diabetes mellitus with diabetic polyneuropathy: Secondary | ICD-10-CM | POA: Diagnosis present

## 2019-10-17 DIAGNOSIS — F332 Major depressive disorder, recurrent severe without psychotic features: Secondary | ICD-10-CM | POA: Diagnosis present

## 2019-10-17 DIAGNOSIS — J440 Chronic obstructive pulmonary disease with acute lower respiratory infection: Secondary | ICD-10-CM | POA: Diagnosis present

## 2019-10-17 DIAGNOSIS — Z89511 Acquired absence of right leg below knee: Secondary | ICD-10-CM

## 2019-10-17 DIAGNOSIS — E1139 Type 2 diabetes mellitus with other diabetic ophthalmic complication: Secondary | ICD-10-CM | POA: Diagnosis present

## 2019-10-17 DIAGNOSIS — J9601 Acute respiratory failure with hypoxia: Secondary | ICD-10-CM | POA: Diagnosis not present

## 2019-10-17 DIAGNOSIS — Z23 Encounter for immunization: Secondary | ICD-10-CM | POA: Diagnosis not present

## 2019-10-17 DIAGNOSIS — D72829 Elevated white blood cell count, unspecified: Secondary | ICD-10-CM

## 2019-10-17 DIAGNOSIS — K760 Fatty (change of) liver, not elsewhere classified: Secondary | ICD-10-CM | POA: Diagnosis present

## 2019-10-17 DIAGNOSIS — I700 Atherosclerosis of aorta: Secondary | ICD-10-CM | POA: Diagnosis present

## 2019-10-17 DIAGNOSIS — R0602 Shortness of breath: Secondary | ICD-10-CM | POA: Diagnosis present

## 2019-10-17 DIAGNOSIS — G4733 Obstructive sleep apnea (adult) (pediatric): Secondary | ICD-10-CM

## 2019-10-17 DIAGNOSIS — I110 Hypertensive heart disease with heart failure: Secondary | ICD-10-CM

## 2019-10-17 DIAGNOSIS — E1169 Type 2 diabetes mellitus with other specified complication: Secondary | ICD-10-CM | POA: Diagnosis present

## 2019-10-17 DIAGNOSIS — I5033 Acute on chronic diastolic (congestive) heart failure: Secondary | ICD-10-CM | POA: Diagnosis not present

## 2019-10-17 DIAGNOSIS — E1165 Type 2 diabetes mellitus with hyperglycemia: Secondary | ICD-10-CM | POA: Diagnosis present

## 2019-10-17 DIAGNOSIS — H42 Glaucoma in diseases classified elsewhere: Secondary | ICD-10-CM | POA: Diagnosis present

## 2019-10-17 DIAGNOSIS — E1151 Type 2 diabetes mellitus with diabetic peripheral angiopathy without gangrene: Secondary | ICD-10-CM | POA: Diagnosis present

## 2019-10-17 DIAGNOSIS — Z20828 Contact with and (suspected) exposure to other viral communicable diseases: Secondary | ICD-10-CM | POA: Diagnosis present

## 2019-10-17 DIAGNOSIS — E11319 Type 2 diabetes mellitus with unspecified diabetic retinopathy without macular edema: Secondary | ICD-10-CM | POA: Diagnosis present

## 2019-10-17 DIAGNOSIS — E785 Hyperlipidemia, unspecified: Secondary | ICD-10-CM | POA: Diagnosis present

## 2019-10-17 DIAGNOSIS — N1831 Chronic kidney disease, stage 3a: Secondary | ICD-10-CM | POA: Diagnosis present

## 2019-10-17 DIAGNOSIS — R05 Cough: Secondary | ICD-10-CM | POA: Diagnosis not present

## 2019-10-17 DIAGNOSIS — R509 Fever, unspecified: Secondary | ICD-10-CM | POA: Diagnosis not present

## 2019-10-17 LAB — BASIC METABOLIC PANEL
Anion gap: 11 (ref 5–15)
BUN: 11 mg/dL (ref 6–20)
CO2: 25 mmol/L (ref 22–32)
Calcium: 8.5 mg/dL — ABNORMAL LOW (ref 8.9–10.3)
Chloride: 103 mmol/L (ref 98–111)
Creatinine, Ser: 1.09 mg/dL — ABNORMAL HIGH (ref 0.44–1.00)
GFR calc Af Amer: >60 mL/min (ref >60)
GFR calc non Af Amer: 55 mL/min — ABNORMAL LOW (ref >60)
Glucose, Bld: 242 mg/dL — ABNORMAL HIGH (ref 70–99)
Potassium: 3.9 mmol/L (ref 3.5–5.1)
Sodium: 139 mmol/L (ref 135–145)

## 2019-10-17 LAB — COMPREHENSIVE METABOLIC PANEL
ALT: 18 U/L (ref 0–44)
AST: 22 U/L (ref 15–41)
Albumin: 2.8 g/dL — ABNORMAL LOW (ref 3.5–5.0)
Alkaline Phosphatase: 162 U/L — ABNORMAL HIGH (ref 38–126)
Anion gap: 14 (ref 5–15)
BUN: 11 mg/dL (ref 6–20)
CO2: 22 mmol/L (ref 22–32)
Calcium: 8.6 mg/dL — ABNORMAL LOW (ref 8.9–10.3)
Chloride: 100 mmol/L (ref 98–111)
Creatinine, Ser: 1.17 mg/dL — ABNORMAL HIGH (ref 0.44–1.00)
GFR calc Af Amer: 59 mL/min — ABNORMAL LOW (ref >60)
GFR calc non Af Amer: 51 mL/min — ABNORMAL LOW (ref >60)
Glucose, Bld: 303 mg/dL — ABNORMAL HIGH (ref 70–99)
Potassium: 4 mmol/L (ref 3.5–5.1)
Sodium: 136 mmol/L (ref 135–145)
Total Bilirubin: 0.6 mg/dL (ref 0.3–1.2)
Total Protein: 7.8 g/dL (ref 6.5–8.1)

## 2019-10-17 LAB — ECHOCARDIOGRAM COMPLETE
Height: 67 in
Weight: 3520 oz

## 2019-10-17 LAB — CBC
HCT: 36.5 % (ref 36.0–46.0)
Hemoglobin: 11.7 g/dL — ABNORMAL LOW (ref 12.0–15.0)
MCH: 30.4 pg (ref 26.0–34.0)
MCHC: 32.1 g/dL (ref 30.0–36.0)
MCV: 94.8 fL (ref 80.0–100.0)
Platelets: 182 10*3/uL (ref 150–400)
RBC: 3.85 MIL/uL — ABNORMAL LOW (ref 3.87–5.11)
RDW: 14.7 % (ref 11.5–15.5)
WBC: 19.7 10*3/uL — ABNORMAL HIGH (ref 4.0–10.5)
nRBC: 0 % (ref 0.0–0.2)

## 2019-10-17 LAB — STREP PNEUMONIAE URINARY ANTIGEN: Strep Pneumo Urinary Antigen: NEGATIVE

## 2019-10-17 LAB — POCT I-STAT 7, (LYTES, BLD GAS, ICA,H+H)
Acid-Base Excess: 1 mmol/L (ref 0.0–2.0)
Bicarbonate: 26.4 mmol/L (ref 20.0–28.0)
Calcium, Ion: 1.18 mmol/L (ref 1.15–1.40)
HCT: 36 % (ref 36.0–46.0)
Hemoglobin: 12.2 g/dL (ref 12.0–15.0)
O2 Saturation: 90 %
Potassium: 3.6 mmol/L (ref 3.5–5.1)
Sodium: 140 mmol/L (ref 135–145)
TCO2: 28 mmol/L (ref 22–32)
pCO2 arterial: 42.4 mmHg (ref 32.0–48.0)
pH, Arterial: 7.403 (ref 7.350–7.450)
pO2, Arterial: 59 mmHg — ABNORMAL LOW (ref 83.0–108.0)

## 2019-10-17 LAB — CBC WITH DIFFERENTIAL/PLATELET
Abs Immature Granulocytes: 0.12 10*3/uL — ABNORMAL HIGH (ref 0.00–0.07)
Basophils Absolute: 0.1 10*3/uL (ref 0.0–0.1)
Basophils Relative: 0 %
Eosinophils Absolute: 0 10*3/uL (ref 0.0–0.5)
Eosinophils Relative: 0 %
HCT: 39.4 % (ref 36.0–46.0)
Hemoglobin: 12.6 g/dL (ref 12.0–15.0)
Immature Granulocytes: 1 %
Lymphocytes Relative: 13 %
Lymphs Abs: 2.6 10*3/uL (ref 0.7–4.0)
MCH: 30.1 pg (ref 26.0–34.0)
MCHC: 32 g/dL (ref 30.0–36.0)
MCV: 94 fL (ref 80.0–100.0)
Monocytes Absolute: 1.5 10*3/uL — ABNORMAL HIGH (ref 0.1–1.0)
Monocytes Relative: 7 %
Neutro Abs: 15.8 10*3/uL — ABNORMAL HIGH (ref 1.7–7.7)
Neutrophils Relative %: 79 %
Platelets: 189 10*3/uL (ref 150–400)
RBC: 4.19 MIL/uL (ref 3.87–5.11)
RDW: 14.6 % (ref 11.5–15.5)
WBC: 20.1 10*3/uL — ABNORMAL HIGH (ref 4.0–10.5)
nRBC: 0 % (ref 0.0–0.2)

## 2019-10-17 LAB — URINALYSIS, ROUTINE W REFLEX MICROSCOPIC
Bilirubin Urine: NEGATIVE
Glucose, UA: >=500 mg/dL — AB
Ketones, ur: NEGATIVE mg/dL
Leukocytes,Ua: NEGATIVE
Nitrite: POSITIVE — AB
Protein, ur: 30 mg/dL — AB
Specific Gravity, Urine: 1.015 (ref 1.005–1.030)
pH: 5 (ref 5.0–8.0)

## 2019-10-17 LAB — CBG MONITORING, ED
Glucose-Capillary: 238 mg/dL — ABNORMAL HIGH (ref 70–99)
Glucose-Capillary: 224 mg/dL — ABNORMAL HIGH (ref 70–99)
Glucose-Capillary: 213 mg/dL — ABNORMAL HIGH (ref 70–99)
Glucose-Capillary: 217 mg/dL — ABNORMAL HIGH (ref 70–99)

## 2019-10-17 LAB — SARS CORONAVIRUS 2 (TAT 6-24 HRS): SARS Coronavirus 2: NEGATIVE

## 2019-10-17 LAB — PROTIME-INR
INR: 1.4 — ABNORMAL HIGH (ref 0.8–1.2)
Prothrombin Time: 16.8 s — ABNORMAL HIGH (ref 11.4–15.2)

## 2019-10-17 LAB — LACTIC ACID, PLASMA
Lactic Acid, Venous: 1.5 mmol/L (ref 0.5–1.9)
Lactic Acid, Venous: 1.7 mmol/L (ref 0.5–1.9)

## 2019-10-17 LAB — APTT: aPTT: 38 seconds — ABNORMAL HIGH (ref 24–36)

## 2019-10-17 LAB — BRAIN NATRIURETIC PEPTIDE: B Natriuretic Peptide: 296.1 pg/mL — ABNORMAL HIGH (ref 0.0–100.0)

## 2019-10-17 LAB — VALPROIC ACID LEVEL: Valproic Acid Lvl: <10 ug/mL — ABNORMAL LOW (ref 50.0–100.0)

## 2019-10-17 MED ORDER — FUROSEMIDE 10 MG/ML IJ SOLN
20.0000 mg | Freq: Once | INTRAMUSCULAR | Status: AC
  Administered 2019-10-17: 20 mg via INTRAVENOUS
  Filled 2019-10-17: qty 2

## 2019-10-17 MED ORDER — SODIUM CHLORIDE 0.9 % IV SOLN
500.0000 mg | INTRAVENOUS | Status: DC
  Administered 2019-10-17: 500 mg via INTRAVENOUS
  Filled 2019-10-17: qty 500

## 2019-10-17 MED ORDER — ALBUTEROL SULFATE HFA 108 (90 BASE) MCG/ACT IN AERS
4.0000 | INHALATION_SPRAY | Freq: Once | RESPIRATORY_TRACT | Status: AC
  Administered 2019-10-17: 4 via RESPIRATORY_TRACT
  Filled 2019-10-17: qty 6.7

## 2019-10-17 MED ORDER — ROSUVASTATIN CALCIUM 20 MG PO TABS
20.0000 mg | ORAL_TABLET | Freq: Every day | ORAL | Status: DC
  Administered 2019-10-17 – 2019-10-18 (×2): 20 mg via ORAL
  Filled 2019-10-17 (×2): qty 1

## 2019-10-17 MED ORDER — PERFLUTREN LIPID MICROSPHERE
1.0000 mL | INTRAVENOUS | Status: AC | PRN
  Administered 2019-10-17: 2 mL via INTRAVENOUS
  Filled 2019-10-17: qty 10

## 2019-10-17 MED ORDER — PREDNISONE 20 MG PO TABS
40.0000 mg | ORAL_TABLET | Freq: Every day | ORAL | Status: DC
  Administered 2019-10-17 – 2019-10-19 (×3): 40 mg via ORAL
  Filled 2019-10-17 (×3): qty 2

## 2019-10-17 MED ORDER — INSULIN GLARGINE 100 UNIT/ML ~~LOC~~ SOLN
30.0000 [IU] | Freq: Every day | SUBCUTANEOUS | Status: DC
  Administered 2019-10-17 – 2019-10-18 (×2): 30 [IU] via SUBCUTANEOUS
  Filled 2019-10-17 (×3): qty 0.3

## 2019-10-17 MED ORDER — INSULIN ASPART 100 UNIT/ML ~~LOC~~ SOLN
0.0000 [IU] | Freq: Three times a day (TID) | SUBCUTANEOUS | Status: DC
  Administered 2019-10-17 – 2019-10-18 (×4): 7 [IU] via SUBCUTANEOUS
  Administered 2019-10-18: 3 [IU] via SUBCUTANEOUS
  Administered 2019-10-18: 15 [IU] via SUBCUTANEOUS
  Administered 2019-10-19: 4 [IU] via SUBCUTANEOUS

## 2019-10-17 MED ORDER — SODIUM CHLORIDE 0.9 % IV SOLN
2.0000 g | INTRAVENOUS | Status: DC
  Administered 2019-10-17: 2 g via INTRAVENOUS
  Filled 2019-10-17: qty 20

## 2019-10-17 MED ORDER — ACETAMINOPHEN 650 MG RE SUPP: 650.0000 mg | Freq: Four times a day (QID) | RECTAL | Status: DC | PRN

## 2019-10-17 MED ORDER — PANTOPRAZOLE SODIUM 40 MG PO TBEC
40.0000 mg | DELAYED_RELEASE_TABLET | Freq: Every day | ORAL | Status: DC
  Administered 2019-10-17 – 2019-10-19 (×3): 40 mg via ORAL
  Filled 2019-10-17 (×3): qty 1

## 2019-10-17 MED ORDER — UMECLIDINIUM-VILANTEROL 62.5-25 MCG/INH IN AEPB
1.0000 | INHALATION_SPRAY | Freq: Every day | RESPIRATORY_TRACT | Status: DC
  Administered 2019-10-18 – 2019-10-19 (×2): 1 via RESPIRATORY_TRACT
  Filled 2019-10-17: qty 14

## 2019-10-17 MED ORDER — ACETAMINOPHEN 325 MG PO TABS
650.0000 mg | ORAL_TABLET | Freq: Once | ORAL | Status: AC
  Administered 2019-10-17: 650 mg via ORAL
  Filled 2019-10-17: qty 2

## 2019-10-17 MED ORDER — ACETAMINOPHEN 325 MG PO TABS
650.0000 mg | ORAL_TABLET | Freq: Four times a day (QID) | ORAL | Status: DC | PRN
  Administered 2019-10-18: 650 mg via ORAL
  Filled 2019-10-17: qty 2

## 2019-10-17 MED ORDER — AZITHROMYCIN 250 MG PO TABS
250.0000 mg | ORAL_TABLET | Freq: Every day | ORAL | Status: DC
  Administered 2019-10-18 – 2019-10-19 (×2): 250 mg via ORAL
  Filled 2019-10-17 (×2): qty 1

## 2019-10-17 MED ORDER — SODIUM CHLORIDE 0.9 % IV SOLN
1.0000 g | INTRAVENOUS | Status: DC
  Administered 2019-10-17 – 2019-10-18 (×2): 1 g via INTRAVENOUS
  Filled 2019-10-17 (×2): qty 10

## 2019-10-17 MED ORDER — INSULIN ASPART 100 UNIT/ML ~~LOC~~ SOLN
0.0000 [IU] | Freq: Every day | SUBCUTANEOUS | Status: DC
  Administered 2019-10-17 – 2019-10-18 (×2): 2 [IU] via SUBCUTANEOUS

## 2019-10-17 MED ORDER — BUPROPION HCL ER (XL) 150 MG PO TB24
300.0000 mg | ORAL_TABLET | Freq: Every day | ORAL | Status: DC
  Administered 2019-10-17 – 2019-10-19 (×3): 300 mg via ORAL
  Filled 2019-10-17 (×4): qty 2

## 2019-10-17 MED ORDER — SODIUM CHLORIDE 0.9% FLUSH
3.0000 mL | Freq: Two times a day (BID) | INTRAVENOUS | Status: DC
  Administered 2019-10-17 – 2019-10-19 (×3): 3 mL via INTRAVENOUS

## 2019-10-17 MED ORDER — ENOXAPARIN SODIUM 40 MG/0.4ML ~~LOC~~ SOLN
40.0000 mg | Freq: Every day | SUBCUTANEOUS | Status: DC
  Administered 2019-10-18 – 2019-10-19 (×2): 40 mg via SUBCUTANEOUS
  Filled 2019-10-17 (×2): qty 0.4

## 2019-10-17 MED ORDER — IPRATROPIUM-ALBUTEROL 0.5-2.5 (3) MG/3ML IN SOLN
3.0000 mL | Freq: Four times a day (QID) | RESPIRATORY_TRACT | Status: DC
  Administered 2019-10-17 – 2019-10-18 (×7): 3 mL via RESPIRATORY_TRACT
  Filled 2019-10-17 (×7): qty 3

## 2019-10-17 MED ORDER — OXYCODONE-ACETAMINOPHEN 5-325 MG PO TABS
1.0000 | ORAL_TABLET | Freq: Three times a day (TID) | ORAL | Status: DC | PRN
  Administered 2019-10-18 – 2019-10-19 (×4): 1 via ORAL
  Filled 2019-10-17 (×4): qty 1

## 2019-10-17 MED ORDER — DARIFENACIN HYDROBROMIDE ER 7.5 MG PO TB24
7.5000 mg | ORAL_TABLET | Freq: Every day | ORAL | Status: DC
  Administered 2019-10-18 – 2019-10-19 (×2): 7.5 mg via ORAL
  Filled 2019-10-17 (×3): qty 1

## 2019-10-17 NOTE — ED Provider Notes (Signed)
[facility] EMERGENCY DEPARTMENT Provider Note   CSN: [id number] Arrival date & time: 10/16/19  2350    History   Chief Complaint Chief Complaint  Patient presents with  . Shortness of Breath    HPI Jennifer [name] is a 58 y.o. female.   The history is provided by the patient and the EMS personnel.  Shortness of Breath Associated symptoms: cough and fever   She has history diabetes, COPD, diastolic heart failure and comes in by ambulance because of shortness of breath, cough, generalized weakness.  She states that she has been sick for about a month.  Cough is productive of thick sputum but she does not know the color.  She denies change in sense of smell or taste.  She does have a sore throat.  She denies nausea, vomiting, diarrhea.  She does complain of generalized body aches.  Several family members have had similar illnesses and all were tested for COVID-19 1 week ago with negative result.  She decided to come to the hospital tonight because she was so weak that she could not get to the bathroom.  She states that she has had some generalized weakness which has been gradually progressing.  Please note, patient is a very poor historian and it is very difficult to get details from talking with her.  Past Medical History:  Diagnosis Date  . Anginal pain [facility])    '3' of 10 ischemia ruled out 9/9   . Arthritis of lumbar spine   . Asthma   . Cataract   . CHF (congestive heart failure) ([place])   . 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 Chronic bronchitis ([place])    "I get it alot" (09/28/2013)  . Chronic diastolic heart failure (HCC)    grade 2 per 2D echocardiogram (01/2013)  . Chronic lower back pain   . Chronic osteomyelitis of foot (HCC)    chronic, right secondary to diabetic foot ulcers  . Chronic pain syndrome 12/03/2011   Likely secondary to depression, "fibromyalgia", neuropathy, and obesity. Lumbar MRI 2014 no sig change from prior (2008) :  Stable hypertrophic facet disease most notable at L4-5. Stable shallow left foraminal/extraforaminal disc protrusion at L4-5. No direct neural compression.      Marland Kitchen COPD 01/08/2007   PFT's 05/2007 : FEV1/FVC 82, FEV1 64% pred, FEF 25-75% 40% predicted, 16% improvement in FEV1 with bronchodilators.     . Depression   . Diabetes mellitus without complication ([place])    Type II  . Diabetic peripheral neuropathy ([place])   . DVT of upper extremity (deep vein thrombosis) ([place]) 03/11/2013   Secondary to PICC line. Right brachial vein, diagnosed on 03/10/2013 Coumadin for 3 months. End date 06/10/2013   . Dyspnea    "smoker"  . Environmental allergies    Hx: of  . Exertional shortness of breath   . Fatty liver 2003   observed on ultrasound abdomen  . Fibromyalgia   . GERD (gastroesophageal reflux disease)   . Glaucoma   . History of use of hearing aid   . Hyperlipidemia   . Hyperplastic colon polyp 12/2010   Per colonoscopy (12/2010) - Dr. Deatra Ina  . Hypertension   . 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 Blood cultures 01/26/13 S. Aureus and GBS. Blood cultures Feb 6th, 8th, and 9th and March were negative.Repeat TEE 3/20 negative for vegitations  . 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 2/2 tobacco use, DM, and cont weight bearing on surgical wound and developed gangrene   . Pneumonia   . Polymicrobial bacterial infection 01/2013   GBS and S. aureus bacteremia // Source likely infected diabetic foot ulcer  . PVD (peripheral vascular disease) with claudication ([place])    Stents to bilateral common iliac arteries (left 2005, right 2008), on chronic plavix  . Rheumatoid arthritis ([place])   . S/P BKA (below knee amputation) unilateral [facility])    Oct 2014 L - failed limb preserving treatment. 2/2 tobacco use, DM, and cont weight bearing on surgical wound and developed gangrene   .  Tobacco abuse   . Type II diabetes mellitus with peripheral circulatory disorders, uncontrolled DX: 1993   Insulin dep. Poor control. Complicated by diabetic foot ulcer and diabetic eye disease.    Marland Kitchen Ulcer of foot, chronic (HCC)    Left. No OM per MRI (01/2013)    Patient Active Problem List   Diagnosis Date Noted  . Angio-edema 08/24/2019  . Preauricular lymphadenopathy 07/13/2019  . Recurrent UTI 07/13/2019  . Skin rash 06/30/2019  . Cellulitis of right external cheek 06/30/2019  . Shoulder impingement, right 06/22/2019  . Vulvar cysts 01/27/2019  . Urinary incontinence 05/13/2018  . OSA (obstructive sleep apnea) 06/12/2017  . Morbid obesity with BMI of 40.0-44.9, adult ([place]) 03/26/2017  . Toe amputation status, right 01/16/2017  . Thrombocytopenia ([place]) 06/05/2016  . 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 Type 2 diabetes with nephropathy ([place]) 09/07/2015  . Uncontrolled diabetes mellitus with retinopathy, due to underlying condition, without macular edema ([place]) 09/05/2015  . Diabetic retinopathy ([place]) 09/05/2015  . Counseling regarding end of life decision making 06/14/2015  . Atherosclerosis of aorta ([place]) 04/04/2015  . Anemia 10/05/2014  . Chronic diastolic heart failure ([place])   . Tobacco abuse   . Severe obesity (BMI >= 40) ([place]) 03/02/2013  . Abnormality of gait 03/01/2013  . Healthcare maintenance 07/10/2012  . Chronic prescription opiate use 12/03/2011  . Secondary diabetes mellitus with peripheral vascular disease ([place]) 08/27/2011  . 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 ([place]) 11/29/2009  . Hypertension associated with diabetes ([place]) 11/29/2009  . Chronic insomnia 10/25/2009  . GASTROESOPHAGEAL REFLUX DISEASE 11/24/2008  . Depression, major, severe recurrence ([place]) 04/06/2008  . DM (diabetes mellitus) type II uncontrolled, periph vascular disorder ([place]) 04/02/2007  . Hyperlipidemia associated with type 2 diabetes mellitus ([place]) 01/08/2007  . COPD (chronic obstructive pulmonary  disease) ([place]) 01/08/2007    Past Surgical History:  Procedure Laterality Date  . ABDOMINAL HYSTERECTOMY  1997   secondary to uterine fibroids  . AMPUTATION Left 08/31/2013   Procedure: AMPUTATION RAY;  Surgeon: Newt Minion, MD;  Location: [place] [facility];  Service: Orthopedics;  Laterality: Left;  Left Foot 5th Ray Amputation  . AMPUTATION Left 09/28/2013   Procedure: Left Midfoot amputation;  Surgeon: Newt Minion, MD;  Location: [facility];  Service: Orthopedics;  Laterality: Left;  Left Midfoot amputation  . AMPUTATION Left 10/14/2013   Procedure: AMPUTATION BELOW KNEE- left;  Surgeon: Newt Minion, MD;  Location: [facility];  Service: Orthopedics;  Laterality: Left;  Left Below Knee Amputation   . AMPUTATION TOE Right 01/15/2017   Procedure: AMPUTATION 5th TOE RIGHT FOOT;  Surgeon: Edrick Kins, DPM;  Location: [place];  Service: Podiatry;  Laterality: Right;  . APPLICATION OF WOUND VAC  04/01/2019   Procedure: Application Of Wound Vac;  Surgeon: Sharol Given,  Illene Regulus, MD;  Location: [facility];  Service: Orthopedics;;  . BLADDER SURGERY     bladder reconstruction surgery  . BREAST BIOPSY     multiple-benign per pt  . COLONOSCOPY    . ESOPHAGOGASTRODUODENOSCOPY N/A 09/20/2013   Procedure: ESOPHAGOGASTRODUODENOSCOPY (EGD);  Surgeon: Jerene Bears, MD;  Location: [facility];  Service: Gastroenterology;  Laterality: N/A;  . FOOT AMPUTATION THROUGH METATARSAL Left 09/28/2013  . GANGLION CYST EXCISION     multiple  . PERIPHERAL VASCULAR INTERVENTION     stents in lower ext  . SHOULDER ARTHROSCOPY W/ ROTATOR CUFF REPAIR Bilateral   . SKIN SPLIT GRAFT Bilateral 05/13/2013   Procedure: Right and Left Foot Allograft Skin Graft;  Surgeon: Newt Minion, MD;  Location: [facility];  Service: Orthopedics;  Laterality: Bilateral;  Right and Left Foot Allograft Skin Graft  . STUMP REVISION Left 04/01/2019   Procedure: REVISION LEFT BELOW KNEE AMPUTATION;  Surgeon: Newt Minion, MD;  Location: [facility];  Service: Orthopedics;   Laterality: Left;  . TEE WITHOUT CARDIOVERSION N/A 01/31/2013   Procedure: TRANSESOPHAGEAL ECHOCARDIOGRAM (TEE);  Surgeon: Fay Records, MD;  Location: [place];  Service: Cardiovascular;  Laterality: N/A;  Rm [phone]  . TEE WITHOUT CARDIOVERSION N/A 03/10/2013   Procedure: TRANSESOPHAGEAL ECHOCARDIOGRAM (TEE);  Surgeon: Larey Dresser, MD;  Location: [facility];  Service: Cardiovascular;  Laterality: N/A;  Rm. [phone]  . TOE AMPUTATION Left 08/31/2013   4TH & 5 TH TOE   . TONSILLECTOMY    . TUBAL LIGATION    . WRIST SURGERY Right    "for tumors" (09/28/2013)     OB History   No obstetric history on file.      Home Medications    Prior to Admission medications   Medication Sig Start Date End Date Taking? Authorizing Provider  albuterol (PROVENTIL HFA;VENTOLIN HFA) 108 (90 Base) MCG/ACT inhaler Inhale 1-2 puffs into the lungs every 4 (four) hours as needed for wheezing or shortness of breath.    [provider]  albuterol (PROVENTIL) (2.5 MG/3ML) 0.083% nebulizer solution INHALE THE CONTENTS OF 1 VIAL VIA NEBULIZER EVERY 6 HOURS AS NEEDED FOR WHEEZING 09/07/19   Bartholomew Crews, MD  amLODipine (NORVASC) 10 MG tablet Take 1 tablet (10 mg total) by mouth daily. 08/24/19 08/23/20  Ladona Horns, MD  amLODipine-benazepril (LOTREL) 10-40 MG capsule TAKE 1 CAPSULE BY MOUTH EVERY DAY 03/25/19   Bartholomew Crews, MD  [facility] ELLIPTA 62.5-25 MCG/INH AEPB TAKE 1 PUFF BY MOUTH EVERY DAY 05/18/19   Bartholomew Crews, MD  atenolol (TENORMIN) 100 MG tablet TAKE 1 TABLET BY MOUTH EVERY DAY Patient taking differently: Take 100 mg by mouth daily.  05/06/19   Bartholomew Crews, MD  baclofen (LIORESAL) 10 MG tablet TAKE 1 TABLET BY MOUTH THREE TIMES A DAY AS NEEDED FOR MUSCLE SPASMS 07/13/19   Bartholomew Crews, MD  betamethasone dipropionate (DIPROLENE) 0.05 % cream Apply 1 application topically 2 (two) times daily as needed (irritation).    [provider]  buPROPion (WELLBUTRIN  XL) 300 MG 24 hr tablet TAKE 1 TABLET BY MOUTH DAILY Patient taking differently: Take 300 mg by mouth daily.  11/26/18   Bartholomew Crews, MD  ciclopirox (LOPROX) 0.77 % cream Apply topically 2 (two) times daily. Call me if no better in 3 weeks 06/16/19   Bartholomew Crews, MD  ciprofloxacin (CIPRO) 500 MG tablet Take 1 tablet (500 mg total) by mouth 2 (two) times daily. 07/13/19  Masoudi, Elhamalsadat, MD  DULoxetine (CYMBALTA) 30 MG capsule TAKE 1 CAPSULE BY MOUTH EVERY DAY 09/09/19   Bartholomew Crews, MD  fluconazole (DIFLUCAN) 150 MG tablet Take 1 tablet (150 mg total) by mouth once a week. 08/18/19   Bartholomew Crews, MD  fluticasone Asencion Islam) 50 MCG/ACT nasal spray INSTILL 1 SPRAY IN EACH NOSTRIL DAILY 03/30/19   Bartholomew Crews, MD  furosemide (LASIX) 40 MG tablet TAKE ONE TABLET BY MOUTH DAILY AS NEEDED 06/15/19   Bartholomew Crews, MD  Insulin Degludec (TRESIBA FLEXTOUCH) 200 UNIT/ML SOPN Inject 70 Units into the skin daily. 07/19/19   Bartholomew Crews, MD  insulin lispro (HUMALOG KWIKPEN) 100 UNIT/ML KwikPen If blood sugar is < 175 Do not take any correction insulin. 176-225 inject 2 units. 226-275 inject 4 units. [id number] inject 6 units. 326-375 inject 8 units. 376-425 inject 10 units. 426-475 inject 12 units. [id number] inject 14 units and call office for further instructions. 09/03/19   Bartholomew Crews, MD  Insulin Pen Needle 32G X 4 MM MISC Use to inject insulin 4 times a day and semaglutide once weekly 09/03/19   Bartholomew Crews, MD  Insulin Syringe-Needle U-100 31G X 15/64" 0.3 ML MISC Use with Humalog to correct blood sugar before meals three times a day 09/02/19   Joni Reining C, DO  ipratropium (ATROVENT) 0.02 % nebulizer solution USE 1 VIAL VIA NEBULIZER EVERY 6 HOURS AS NEEDED FOR WHEEZING 07/13/19   Bartholomew Crews, MD  ketoconazole (NIZORAL) 2 % cream Apply 1 application topically daily. Apply to L stomach / thigh skin fold for 2 weeks 08/18/19   Bartholomew Crews, MD  metFORMIN (GLUCOPHAGE-XR) 500 MG 24 hr tablet TAKE 1 TABLET BY MOUTH EVERY DAY WITH BREAKFAST Patient taking differently: Take 500 mg by mouth daily with breakfast.  05/06/19   Bartholomew Crews, MD  MYRBETRIQ 50 MG TB24 tablet TAKE 1 TABLET BY MOUTH DAILY 10/06/19   Axel Filler, MD  nystatin (NYSTATIN) powder APPLY TO AFFECTED AREA THREE TIMES A DAY 10/10/19   Bartholomew Crews, MD  NYSTATIN powder APPLY TO AFFECTED AREA THREE TIMES A DAY 10/06/19   Axel Filler, MD  omeprazole (PRILOSEC) 20 MG capsule TAKE 1 CAPSULE (20 MG TOTAL) BY MOUTH 2 (TWO) TIMES DAILY BEFORE A MEAL. 05/06/19   Bartholomew Crews, MD  oxyCODONE-acetaminophen (PERCOCET/ROXICET) 5-325 MG tablet Take 1 tablet by mouth every 8 (eight) hours as needed. May take a 4th pill if severe pain (up to 10 days monthly) #100 equals 30 day supply 10/14/19   Bartholomew Crews, MD  permethrin Nancy Fetter) 1 % lotion Apply to affected area daily 06/30/19 06/28/20  Harvie Heck, MD  pregabalin (LYRICA) 300 MG capsule Take 300 mg by mouth 3 (three) times daily.    [provider]  rosuvastatin (CRESTOR) 20 MG tablet TAKE 1 TABLET BY MOUTH AT BEDTIME Patient taking differently: Take 20 mg by mouth daily.  05/06/19   Bartholomew Crews, MD  Semaglutide,0.25 or 0.5MG /DOS, (OZEMPIC, 0.25 OR 0.5 MG/DOSE,) 2 MG/1.5ML SOPN Inject 0.5 mg into the skin once a week. 07/19/19   Bartholomew Crews, MD  silver sulfADIAZINE (SILVADENE) 1 % cream Apply 1 application topically daily. To right foot daily 06/06/19   Rayburn, Neta Mends, PA-C  solifenacin (VESICARE) 5 MG tablet Take 1 tablet (5 mg total) by mouth daily. 06/30/19 06/29/20  Harvie Heck, MD  sulfamethoxazole-trimethoprim (BACTRIM DS) 800-160 MG tablet Take 1 tablet by mouth  2 (two) times daily. 06/06/19   Rayburn, Neta Mends, PA-C  Potassium Chloride ER 20 MEQ TBCR Take 40 mEq by mouth daily. 01/02/14 01/23/14  Othella Boyer, MD    Family  History Family History  Problem Relation Age of Onset  . Diverticulosis Mother   . Diabetes Mother   . Hypertension Mother   . Congestive Heart Failure Mother   . Asthma Father   . CAD Sister 78       MI at age 64 per patient.  However, she has not had a stent or CABG.   . Heart disease Sister        before age 79  . Breast cancer Neg Hx     Social History Social History   Tobacco Use  . Smoking status: Current Every Day Smoker    Packs/day: 1.00    Years: 50.00    Pack years: 50.00    Types: Cigarettes  . Smokeless tobacco: Never Used  . Tobacco comment: 1 PPD  Substance Use Topics  . Alcohol use: No    Alcohol/week: 0.0 standard drinks  . Drug use: No    Types: Marijuana, "Crack" cocaine    Comment: 09/28/2013 "no marijuana since 2011, no crack/cocaine 1989"     Allergies   Iohexol, Ivp dye [iodinated diagnostic agents], Abilify [aripiprazole], and Morphine sulfate   Review of Systems Review of Systems  Constitutional: Positive for chills and fever.  Respiratory: Positive for cough and shortness of breath.   Musculoskeletal: Positive for myalgias.  All other systems reviewed and are negative.    Physical Exam Updated Vital Signs BP (!) 146/58   Pulse 92   Temp (!) 102.6 F (39.2 C) (Oral)   Ht 5\' 7"  (1.702 m)   Wt 99.8 kg   SpO2 92%   BMI 34.46 kg/m   Physical Exam Vitals signs and nursing note reviewed.    58 year old female, resting comfortably and in no acute distress. Vital signs are significant for fever, elevated blood pressure. Oxygen saturation is 92%, which is normal, but only maintained with oxygen via mask. Head is normocephalic and atraumatic. PERRLA, EOMI. Oropharynx is clear. Neck is nontender and supple without adenopathy or JVD. Back is nontender and there is no CVA tenderness. Lungs have bibasilar rales and faint upper airway wheezes. Chest is nontender. Heart has regular rate and rhythm without murmur. Abdomen is soft, flat,  nontender without masses or hepatosplenomegaly and peristalsis is normoactive. Extremities: Left below the knee amputation.  2+ pitting edema on the right leg.  Moderate venous stasis changes present. Skin is warm and dry without rash. Neurologic: Mental status is normal, cranial nerves are intact, there are no motor or sensory deficits.  ED Treatments / Results  Labs (all labs ordered are listed, but only abnormal results are displayed) Labs Reviewed  COMPREHENSIVE METABOLIC PANEL - Abnormal; Notable for the following components:      Result Value   Glucose, Bld 303 (*)    Creatinine, Ser 1.17 (*)    Calcium 8.6 (*)    Albumin 2.8 (*)    Alkaline Phosphatase 162 (*)    GFR calc non Af Amer 51 (*)    GFR calc Af Amer 59 (*)    All other components within normal limits  CBC WITH DIFFERENTIAL/PLATELET - Abnormal; Notable for the following components:   WBC 20.1 (*)    Neutro Abs 15.8 (*)    Monocytes Absolute 1.5 (*)    Abs Immature  Granulocytes 0.12 (*)    All other components within normal limits  APTT - Abnormal; Notable for the following components:   aPTT 38 (*)    All other components within normal limits  PROTIME-INR - Abnormal; Notable for the following components:   Prothrombin Time 16.8 (*)    INR 1.4 (*)    All other components within normal limits  BRAIN NATRIURETIC PEPTIDE - Abnormal; Notable for the following components:   B Natriuretic Peptide 296.1 (*)    All other components within normal limits  VALPROIC ACID LEVEL - Abnormal; Notable for the following components:   Valproic Acid Lvl <10 (*)    All other components within normal limits  CULTURE, BLOOD (ROUTINE X 2)  CULTURE, BLOOD (ROUTINE X 2)  URINE CULTURE  SARS CORONAVIRUS 2 (TAT 6-24 HRS)  LACTIC ACID, PLASMA  URINALYSIS, ROUTINE W REFLEX MICROSCOPIC    EKG EKG Interpretation  Date/Time:  Monday October 17 2019 00:20:59 EDT Ventricular Rate:  87 PR Interval:    QRS Duration: 80 QT  Interval:  353 QTC Calculation: 425 R Axis:     Text Interpretation:  Sinus rhythm Low voltage, precordial leads Abnormal R-wave progression, early transition Baseline wander in lead(s) II aVF V1 When compared with ECG of 03/25/2019, No significant change was found Confirmed by Delora Fuel ([id number]) on 10/17/2019 12:37:26 AM   Radiology Dg Chest Port 1 View  Result Date: 10/17/2019 CLINICAL DATA:  58 year old female with fever and cough. EXAM: PORTABLE CHEST 1 VIEW COMPARISON:  Chest radiograph dated 05/11/2019 FINDINGS: There is mild cardiomegaly with mild vascular congestion. Bibasilar densities, right greater left concerning for infiltrate. Clinical correlation and follow-up to resolution recommended. There is no large pleural effusion. No pneumothorax. Atherosclerotic calcification of the aorta. No acute osseous pathology. IMPRESSION: 1. Mild cardiomegaly with mild vascular congestion. 2. Bibasilar densities, right greater left concerning for infiltrate. Clinical correlation and follow-up to resolution recommended. Electronically Signed   By: Anner Crete M.D.   On: 10/17/2019 00:49    Procedures Procedures  CRITICAL CARE Performed by: Delora Fuel Total critical care time: 40 minutes Critical care time was exclusive of separately billable procedures and treating other patients. Critical care was necessary to treat or prevent imminent or life-threatening deterioration. Critical care was time spent personally by me on the following activities: development of treatment plan with patient and/or surrogate as well as nursing, discussions with consultants, evaluation of patient's response to treatment, examination of patient, obtaining history from patient or surrogate, ordering and performing treatments and interventions, ordering and review of laboratory studies, ordering and review of radiographic studies, pulse oximetry and re-evaluation of patient's condition.  Medications Ordered in  ED Medications  acetaminophen (TYLENOL) tablet 650 mg (has no administration in time range)  albuterol (VENTOLIN HFA) 108 (90 Base) MCG/ACT inhaler 4 puff (has no administration in time range)     Initial Impression / Assessment and Plan / ED Course  I have reviewed the triage vital signs and the nursing notes.  Pertinent labs & imaging results that were available during my care of the patient were reviewed by me and considered in my medical decision making (see chart for details).  Fever, cough, weakness.  In the setting of the [id number] pandemic, certainly [id number] is a consideration as is influenza and pneumonia.  Chest x-ray is pending.  Old records are reviewed, and she has no relevant past visits.  Sepsis labs are obtained, will hold off on selection of antibiotics pending chest x-ray result.  Chest x-ray is consistent with pulmonary vascular congestion and also with bilateral pneumonia.  Given fever, pneumonia is certainly felt to be a significant component of her findings.  BNP is also elevated consistent with some degree of heart failure.  WBC is significantly elevated, and alkaline phosphatase mildly elevated.  Lactic acid level is normal.  She is started on ceftriaxone and azithromycin for community-acquired pneumonia and given a dose of furosemide for fluid overload.  Case is discussed with Dr. Marva Panda of internal medicine teaching service who agrees to admit the patient.  Jennifer [name] was evaluated in Emergency Department on 10/17/2019 for the symptoms described in the history of present illness. She was evaluated in the context of the global COVID-19 pandemic, which necessitated consideration that the patient might be at risk for infection with the SARS-CoV-2 virus that causes COVID-19. Institutional protocols and algorithms that pertain to the evaluation of patients at risk for COVID-19 are in a state of rapid change based on information released by regulatory bodies including the CDC  and federal and state organizations. These policies and algorithms were followed during the patient's care in the ED.  Final Clinical Impressions(s) / ED Diagnoses   Final diagnoses:  Community acquired pneumonia, unspecified laterality  Acute on chronic diastolic heart failure ([place])  Alkaline phosphatase elevation    ED Discharge Orders    None       Delora Fuel, MD [id number] [phone]

## 2019-10-17 NOTE — Progress Notes (Signed)
NAME:  Jennifer [name], MRN:  [id number], DOB:  1961-05-04, LOS: 0 ADMISSION DATE:  10/16/2019,  Primary: Bartholomew Crews, MD  CHIEF COMPLAINT:  Shortness of breath  Medical Service: Internal Medicine Teaching Service         Attending Physician: Dr. Velna Ochs, MD    First Contact: Dr. Darrick Meigs Pager: [phone]  Second Contact: Dr. Sharon Seller Pager: [phone]       After Hours (After 5p/  First Contact Pager: [phone]  weekends / holidays): Second Contact Pager: [phone]    Brief History  This is a 58 year old female with pertinent past medical history of COPD, OSA and chronic diastolic heart failure who presented to the ED with 1 month of progressive cough and shortness of breath.  Upon arrival to the ED patient was found to be febrile with a T-max of 102.6 F and hypoxic requiring 6 L nasal cannula.  Patient was noted to have a leukocytosis--white count 20.  Chest x-ray in the ED revealed bilateral infiltrates possibly concerning for pneumonia.  She subsequently started on Rocephin and azithromycin.  BNP in the ED was only mildly elevated at 300.  UA was also significant for findings consistent with urinary tract infection  Subjective  Patient was evaluated in the ED this morning by myself and the internal medicine teaching team.  Patient was responsive to voice however did appear acutely ill. Pt did not contribute to history further.   Significant Hospital Events   10/26 Admission  Objective   Blood pressure (!) 136/55, pulse 73, temperature 99 F (37.2 C), temperature source Oral, resp. rate 15, height 5\' 7"  (1.702 m), weight 99.8 kg, SpO2 91 %.    No intake or output data in the 24 hours ending 10/17/19 1453 Filed Weights   10/17/19 0014  Weight: 99.8 kg    Examination: GENERAL: in no acute distress HEENT: head atraumatic. No conjunctival injection. Nares patent.   CARDIAC: heart RRR. +2 pitting edema to LLE. R BKA PULMONARY: scattered rhonchi. Breathing  comfortably. ABDOMEN: soft. bs active NEURO: awakens to voice. Follows commands.  SKIN: no rash or lesions on limited exam  Musculoskeletal: right BKA  Consults:  none  Significant Diagnostic Tests:  10/26 CXR: bibasilar opacities, right>left  10/26 ECHO: Micro Data:  10/26 BC>> 10/26 UC>> Urine antigens -legionella>> -strep pneumo>>  Antimicrobials:  Azithromycin 10/26>> Rocephin 10/26>>  Labs    CBC CBC Latest Ref Rng & Units 10/17/2019 10/17/2019 10/17/2019  WBC 4.0 - 10.5 K/uL 19.7(H) - 20.1(H)  Hemoglobin 12.0 - 15.0 g/dL 11.7(L) 12.2 12.6  Hematocrit 36.0 - 46.0 % 36.5 36.0 39.4  Platelets 150 - 400 K/uL 182 - 189   BMP Latest Ref Rng & Units 10/17/2019 10/17/2019 10/17/2019  Glucose 70 - 99 mg/dL 242(H) - 303(H)  BUN 6 - 20 mg/dL 11 - 11  Creatinine 0.44 - 1.00 mg/dL 1.09(H) - 1.17(H)  BUN/Creat Ratio 12 - 28 - - -  Sodium 135 - 145 mmol/L 139 140 136  Potassium 3.5 - 5.1 mmol/L 3.9 3.6 4.0  Chloride 98 - 111 mmol/L 103 - 100  CO2 22 - 32 mmol/L 25 - 22  Calcium 8.9 - 10.3 mg/dL 8.5(L) - 8.6(L)   Lactic Acid, Venous    Component Value Date/Time   LATICACIDVEN 1.5 10/17/2019 0950   ABG    Component Value Date/Time   PHART 7.403 10/17/2019 0908   PCO2ART 42.4 10/17/2019 0908   PO2ART 59.0 (L) 10/17/2019 0908   HCO3 26.4 10/17/2019 0908  TCO2 28 10/17/2019 0908   O2SAT 90.0 10/17/2019 0908      Summary  58 yo female with COPD, HFpEF, DM, HTN presenting with 4w hx of progressive cough, shortness of breath and sputum production. Found to have fever and leukocytosis in ED. COPD exac vs CAP vs CHF exacerbation. Additionally, UA findings consistent with UTI.  Assessment & Plan:  Active Problems:   Acute hypoxemic respiratory failure (HCC)  Acute hypoxemic respiratory failure.  COPD exacerbation  CAP.  Initially required up to 10L HFNC which has been able to be titrated down to 6L now. CXR demonstrates bibasilar densities. White count 20k on  admission. Tmax of 102F. Lactate normal and vss so not likely SIRS. ABG reassuring. BNP 300 on admission and no signs aside from lower extremity edema that would point to this being heart failure exacerbation.  Plan Continue azithromycin + rocephin + prednisone duonebs +ellipta Repeat cbc in am Given her recent hx of diarrhea, will also send for legionella and strep pneumo urine antigens.  Urinary tract infection. Hx of recurrent UTIs. Urine cultures pending. Plan: continue rocephin  HFrEF. Unlikely to be primary contributor to sx. Updated weight not available. Unable to find the report but appears that last echo appears to be in 2014. CXR does not suggest vascular congestion. BNP mildly elevated at 300. No JVD.  Plan ECHO Will continue to hold her home lasix to avoid hypotension. Continuous tele Daily weights, strict I/O  HTN: continue holding antihypertensives. Hyperlipidemia: continue crestor DM II: blood sugar >400 on admission. Has since improved. Plan: continue CBGs, lantus, and SSI. GERD-continue protonix Depression: continue wellbutrin  Best practice:  CODE STATUS: FULL Diet: NPO Pain management: tylenol GI prophylaxis: protonix DVT for prophylaxis: lovenox Dispo: pending medical stabilization.   Mitzi Hansen, MD INTERNAL MEDICINE RESIDENT PGY-1 PAGER #: [phone] 10/17/19 2:53 PM

## 2019-10-17 NOTE — ED Notes (Signed)
ED TO INPATIENT HANDOFF REPORT  ED Nurse Name and Phone #: Albertine Grates [phone]    S Name/Age/Gender Jennifer [name] 58 y.o. female Room/Bed: 034C/034C  Code Status   Code Status: Full Code  Home/SNF/Other Home Patient oriented to: self, place, time and situation Is this baseline? Yes   Triage Complete: Triage complete  Chief Complaint shob  Triage Note Pt coming from home after complaints of shob, productive cough,and fever (couldn't state what it had been at home but EMS got 100 degrees). Pt at 76% on RA, placed on nonrebreather and increased to 90% but pt began pulling it off due to nose being stopped up. Placed on simple mask, now at 94%. Hx of COPD and CHF. Rhonchi in lower fields. CBG 406, pt states she is normally in the upper 200s for baseline   Allergies Allergies  Allergen Reactions  . Iohexol Other (See Comments)     Desc: IV CONTRAST CAUSE NEPHROPATHY IN 2007   . Ivp Dye [Iodinated Diagnostic Agents] Other (See Comments)    Desc: IV CONTRAST CAUSE NEPHROPATHY IN 2007  . Abilify [Aripiprazole] Other (See Comments)    Urinary freq Nov 2016  . Morphine Sulfate Itching and Rash    Level of Care/Admitting Diagnosis ED Disposition    ED Disposition Condition Comment   Admit  Hospital Area: [place] [[id number]]  Level of Care: Progressive [102]  Covid Evaluation: Person Under Investigation (PUI)  Diagnosis: Acute hypoxemic respiratory failure [facility]) [id number]  Admitting Physician: Velna Ochs [phone]  Attending Physician: Velna Ochs [phone]  Estimated length of stay: past midnight tomorrow  Certification:: I certify this patient will need inpatient services for at least 2 midnights  PT Class (Do Not Modify): Inpatient [101]  PT Acc Code (Do Not Modify): Private [1]       B Medical/Surgery History Past Medical History:  Diagnosis Date  . Anginal pain [facility])    '3' of 10 ischemia ruled out 9/9   . Arthritis of lumbar  spine   . Asthma   . Cataract   . CHF (congestive heart failure) ([place])   . 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 Chronic bronchitis ([place])    "I get it alot" (09/28/2013)  . Chronic diastolic heart failure (HCC)    grade 2 per 2D echocardiogram (01/2013)  . Chronic lower back pain   . Chronic osteomyelitis of foot (HCC)    chronic, right secondary to diabetic foot ulcers  . Chronic pain syndrome 12/03/2011   Likely secondary to depression, "fibromyalgia", neuropathy, and obesity. Lumbar MRI 2014 no sig change from prior (2008) : Stable hypertrophic facet disease most notable at L4-5. Stable shallow left foraminal/extraforaminal disc protrusion at L4-5. No direct neural compression.      Marland Kitchen COPD 01/08/2007   PFT's 05/2007 : FEV1/FVC 82, FEV1 64% pred, FEF 25-75% 40% predicted, 16% improvement in FEV1 with bronchodilators.     . Depression   . Diabetes mellitus without complication ([place])    Type II  . Diabetic peripheral neuropathy ([place])   . DVT of upper extremity (deep vein thrombosis) ([place]) 03/11/2013   Secondary to PICC line. Right brachial vein, diagnosed on 03/10/2013 Coumadin for 3 months. End date 06/10/2013   . Dyspnea    "smoker"  . Environmental allergies    Hx: of  . Exertional shortness of breath   . Fatty liver 2003   observed on ultrasound abdomen  . Fibromyalgia   . GERD (gastroesophageal  reflux disease)   . Glaucoma   . History of use of hearing aid   . Hyperlipidemia   . Hyperplastic colon polyp 12/2010   Per colonoscopy (12/2010) - Dr. Deatra Ina  . Hypertension   . 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 Blood cultures 01/26/13 S. Aureus and GBS. Blood cultures Feb 6th, 8th, and 9th and March were negative.Repeat TEE 3/20 negative for vegitations  . 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 2/2 tobacco use, DM, and cont weight bearing  on surgical wound and developed gangrene   . Pneumonia   . Polymicrobial bacterial infection 01/2013   GBS and S. aureus bacteremia // Source likely infected diabetic foot ulcer  . PVD (peripheral vascular disease) with claudication ([place])    Stents to bilateral common iliac arteries (left 2005, right 2008), on chronic plavix  . Rheumatoid arthritis ([place])   . S/P BKA (below knee amputation) unilateral [facility])    Oct 2014 L - failed limb preserving treatment. 2/2 tobacco use, DM, and cont weight bearing on surgical wound and developed gangrene   . Tobacco abuse   . Type II diabetes mellitus with peripheral circulatory disorders, uncontrolled DX: 1993   Insulin dep. Poor control. Complicated by diabetic foot ulcer and diabetic eye disease.    Marland Kitchen Ulcer of foot, chronic (HCC)    Left. No OM per MRI (01/2013)   Past Surgical History:  Procedure Laterality Date  . ABDOMINAL HYSTERECTOMY  1997   secondary to uterine fibroids  . AMPUTATION Left 08/31/2013   Procedure: AMPUTATION RAY;  Surgeon: Newt Minion, MD;  Location: [place];  Service: Orthopedics;  Laterality: Left;  Left Foot 5th Ray Amputation  . AMPUTATION Left 09/28/2013   Procedure: Left Midfoot amputation;  Surgeon: Newt Minion, MD;  Location: [place];  Service: Orthopedics;  Laterality: Left;  Left Midfoot amputation  . AMPUTATION Left 10/14/2013   Procedure: AMPUTATION BELOW KNEE- left;  Surgeon: Newt Minion, MD;  Location: [place];  Service: Orthopedics;  Laterality: Left;  Left Below Knee Amputation   . AMPUTATION TOE Right 01/15/2017   Procedure: AMPUTATION 5th TOE RIGHT FOOT;  Surgeon: Edrick Kins, DPM;  Location: [place];  Service: Podiatry;  Laterality: Right;  . APPLICATION OF WOUND VAC  04/01/2019   Procedure: Application Of Wound Vac;  Surgeon: Newt Minion, MD;  Location: [place];  Service: Orthopedics;;  . BLADDER SURGERY     bladder reconstruction surgery  . BREAST BIOPSY     multiple-benign per pt  . COLONOSCOPY    .  ESOPHAGOGASTRODUODENOSCOPY N/A 09/20/2013   Procedure: ESOPHAGOGASTRODUODENOSCOPY (EGD);  Surgeon: Jerene Bears, MD;  Location: [place];  Service: Gastroenterology;  Laterality: N/A;  . FOOT AMPUTATION THROUGH METATARSAL Left 09/28/2013  . GANGLION CYST EXCISION     multiple  . PERIPHERAL VASCULAR INTERVENTION     stents in lower ext  . SHOULDER ARTHROSCOPY W/ ROTATOR CUFF REPAIR Bilateral   . SKIN SPLIT GRAFT Bilateral 05/13/2013   Procedure: Right and Left Foot Allograft Skin Graft;  Surgeon: Newt Minion, MD;  Location: [place];  Service: Orthopedics;  Laterality: Bilateral;  Right and Left Foot Allograft Skin Graft  . STUMP REVISION Left 04/01/2019   Procedure: REVISION LEFT BELOW KNEE AMPUTATION;  Surgeon: Newt Minion, MD;  Location: [place];  Service: Orthopedics;  Laterality: Left;  . TEE  WITHOUT CARDIOVERSION N/A 01/31/2013   Procedure: TRANSESOPHAGEAL ECHOCARDIOGRAM (TEE);  Surgeon: Fay Records, MD;  Location: [place];  Service: Cardiovascular;  Laterality: N/A;  Rm [phone]  . TEE WITHOUT CARDIOVERSION N/A 03/10/2013   Procedure: TRANSESOPHAGEAL ECHOCARDIOGRAM (TEE);  Surgeon: Larey Dresser, MD;  Location: [place];  Service: Cardiovascular;  Laterality: N/A;  Rm. [id number]  . TOE AMPUTATION Left 08/31/2013   4TH & 5 TH TOE   . TONSILLECTOMY    . TUBAL LIGATION    . WRIST SURGERY Right    "for tumors" (09/28/2013)     A IV Location/Drains/Wounds Patient Lines/Drains/Airways Status   Active Line/Drains/Airways    Name:   Placement date:   Placement time:   Site:   Days:   Peripheral IV 10/17/19 Left Antecubital   10/17/19    --    Antecubital   less than 1   Negative Pressure Wound Therapy Leg Left   04/01/19    0958    --   199   External Urinary Catheter   05/13/19    2000    --   157   Incision 05/13/13 Leg Right   05/13/13    1204     2348   Incision 09/28/13 Foot Left   09/28/13    1109     2210   Incision 10/14/13 Leg Left   10/14/13    1531     2194   Incision  (Closed) 01/15/17 Leg Right   01/15/17    1832     1005   Incision (Closed) 04/01/19 Leg   04/01/19    0808     199   Wound 03/09/13 Diabetic ulcer Foot Left   03/09/13    1130    Foot   2413   Wound 04/30/13 Other (Comment) Foot Left rolled edges of wound with yellowish and greenish drainage   04/30/13    1000    Foot   2361   Wound 07/07/13 Diabetic ulcer Foot Right   07/07/13    1400    Foot   2293   Wound / Incision (Open or Dehisced) 05/03/14 Other (Comment) Leg Anterior;Left   05/03/14    2000    Leg   1993          Intake/Output Last 24 hours No intake or output data in the 24 hours ending 10/17/19 1541  Labs/Imaging Results for orders placed or performed during the hospital encounter of 10/16/19 (from the past 48 hour(s))  Lactic acid, plasma     Status: None   Collection Time: 10/17/19 12:15 AM  Result Value Ref Range   Lactic Acid, Venous 1.7 0.5 - 1.9 mmol/L    Comment: Performed at [facility], [place] [address], [place], [place] [address]  Comprehensive metabolic panel     Status: Abnormal   Collection Time: 10/17/19 12:15 AM  Result Value Ref Range   Sodium 136 135 - 145 mmol/L   Potassium 4.0 3.5 - 5.1 mmol/L   Chloride 100 98 - 111 mmol/L   CO2 22 22 - 32 mmol/L   Glucose, Bld 303 (H) 70 - 99 mg/dL   BUN 11 6 - 20 mg/dL   Creatinine, Ser 1.17 (H) 0.44 - 1.00 mg/dL   Calcium 8.6 (L) 8.9 - 10.3 mg/dL   Total Protein 7.8 6.5 - 8.1 g/dL   Albumin 2.8 (L) 3.5 - 5.0 g/dL   AST 22 15 - 41 U/L   ALT  18 0 - 44 U/L   Alkaline Phosphatase 162 (H) 38 - 126 U/L   Total Bilirubin 0.6 0.3 - 1.2 mg/dL   GFR calc non Af Amer 51 (L) >60 mL/min   GFR calc Af Amer 59 (L) >60 mL/min   Anion gap 14 5 - 15    Comment: Performed at [facility] [address], [place], [place] [address]  CBC WITH DIFFERENTIAL     Status: Abnormal   Collection Time: 10/17/19 12:15 AM  Result Value Ref Range   WBC 20.1 (H) 4.0 - 10.5 K/uL   RBC 4.19 3.87 - 5.11 MIL/uL   Hemoglobin 12.6  12.0 - 15.0 g/dL   HCT 39.4 36.0 - 46.0 %   MCV 94.0 80.0 - 100.0 fL   MCH 30.1 26.0 - 34.0 pg   MCHC 32.0 30.0 - 36.0 g/dL   RDW 14.6 11.5 - 15.5 %   Platelets 189 150 - 400 K/uL   nRBC 0.0 0.0 - 0.2 %   Neutrophils Relative % 79 %   Neutro Abs 15.8 (H) 1.7 - 7.7 K/uL   Lymphocytes Relative 13 %   Lymphs Abs 2.6 0.7 - 4.0 K/uL   Monocytes Relative 7 %   Monocytes Absolute 1.5 (H) 0.1 - 1.0 K/uL   Eosinophils Relative 0 %   Eosinophils Absolute 0.0 0.0 - 0.5 K/uL   Basophils Relative 0 %   Basophils Absolute 0.1 0.0 - 0.1 K/uL   Immature Granulocytes 1 %   Abs Immature Granulocytes 0.12 (H) 0.00 - 0.07 K/uL    Comment: Performed at [facility], [address], [place], [place] [address]  APTT     Status: Abnormal   Collection Time: 10/17/19 12:15 AM  Result Value Ref Range   aPTT 38 (H) 24 - 36 seconds    Comment:        IF BASELINE aPTT IS ELEVATED, SUGGEST PATIENT RISK ASSESSMENT BE USED TO DETERMINE APPROPRIATE ANTICOAGULANT THERAPY. Performed at [facility], [place] [address], [place], [place] [address]   Protime-INR     Status: Abnormal   Collection Time: 10/17/19 12:15 AM  Result Value Ref Range   Prothrombin Time 16.8 (H) 11.4 - 15.2 seconds   INR 1.4 (H) 0.8 - 1.2    Comment: (NOTE) INR goal varies based on device and disease states. Performed at [facility], [place] [address], [place], [place] [address]   Brain natriuretic peptide     Status: Abnormal   Collection Time: 10/17/19 12:15 AM  Result Value Ref Range   B Natriuretic Peptide 296.1 (H) 0.0 - 100.0 pg/mL    Comment: Performed at [facility] [address] [place], [place] [address]  Valproic acid level     Status: Abnormal   Collection Time: 10/17/19 12:15 AM  Result Value Ref Range   Valproic Acid Lvl <10 (L) 50.0 - 100.0 ug/mL    Comment: RESULTS CONFIRMED BY MANUAL DILUTION Performed at [facility] [address], [place], [place] [address]   SARS CORONAVIRUS 2  (TAT 6-24 HRS) Nasopharyngeal Nasopharyngeal Swab     Status: None   Collection Time: 10/17/19 12:27 AM   Specimen: Nasopharyngeal Swab  Result Value Ref Range   SARS Coronavirus 2 NEGATIVE NEGATIVE    Comment: (NOTE) SARS-CoV-2 target nucleic acids are NOT DETECTED. The SARS-CoV-2 RNA is generally detectable in upper and lower respiratory specimens during the acute phase of infection. Negative  results do not preclude SARS-CoV-2 infection, do not rule out co-infections with other pathogens, and should not be used as the sole basis for treatment or other patient management decisions. Negative results must be combined with clinical observations, patient history, and epidemiological information. The expected result is Negative. Fact Sheet for Patients: SugarRoll.be Fact Sheet for Healthcare Providers: https://www.woods-mathews.com/ This test is not yet approved or cleared by the [place] FDA and  has been authorized for detection and/or diagnosis of SARS-CoV-2 by FDA under an Emergency Use Authorization (EUA). This EUA will remain  in effect (meaning this test can be used) for the duration of the COVID-19 declaration under Section 56 4(b)(1) of the Act, 21 U.S.C. section 360bbb-3(b)(1), unless the authorization is terminated or revoked sooner. Performed at [facility], [place] [address], [place], [place] [address]   Urinalysis, Routine w reflex microscopic     Status: Abnormal   Collection Time: 10/17/19  4:59 AM  Result Value Ref Range   Color, Urine YELLOW YELLOW   APPearance CLEAR CLEAR   Specific Gravity, Urine 1.015 1.005 - 1.030   pH 5.0 5.0 - 8.0   Glucose, UA >=500 (A) NEGATIVE mg/dL   Hgb urine dipstick SMALL (A) NEGATIVE   Bilirubin Urine NEGATIVE NEGATIVE   Ketones, ur NEGATIVE NEGATIVE mg/dL   Protein, ur 30 (A) NEGATIVE mg/dL   Nitrite POSITIVE (A) NEGATIVE   Leukocytes,Ua NEGATIVE NEGATIVE   RBC / HPF 0-5 0 - 5  RBC/hpf   WBC, UA 6-10 0 - 5 WBC/hpf   Bacteria, UA MANY (A) NONE SEEN   Mucus PRESENT     Comment: Performed at [facility] [address] [place], [place] [address]  CBG monitoring, ED     Status: Abnormal   Collection Time: 10/17/19  8:15 AM  Result Value Ref Range   Glucose-Capillary 238 (H) 70 - 99 mg/dL   Comment 1 Notify RN    Comment 2 Document in Chart   I-STAT 7, (LYTES, BLD GAS, ICA, H+H)     Status: Abnormal   Collection Time: 10/17/19  9:08 AM  Result Value Ref Range   pH, Arterial 7.403 7.350 - 7.450   pCO2 arterial 42.4 32.0 - 48.0 mmHg   pO2, Arterial 59.0 (L) 83.0 - 108.0 mmHg   Bicarbonate 26.4 20.0 - 28.0 mmol/L   TCO2 28 22 - 32 mmol/L   O2 Saturation 90.0 %   Acid-Base Excess 1.0 0.0 - 2.0 mmol/L   Sodium 140 135 - 145 mmol/L   Potassium 3.6 3.5 - 5.1 mmol/L   Calcium, Ion 1.18 1.15 - 1.40 mmol/L   HCT 36.0 36.0 - 46.0 %   Hemoglobin 12.2 12.0 - 15.0 g/dL   Patient temperature HIDE    Collection site RADIAL, ALLEN'S TEST ACCEPTABLE    Drawn by RT    Sample type ARTERIAL   Basic metabolic panel     Status: Abnormal   Collection Time: 10/17/19  9:50 AM  Result Value Ref Range   Sodium 139 135 - 145 mmol/L   Potassium 3.9 3.5 - 5.1 mmol/L   Chloride 103 98 - 111 mmol/L   CO2 25 22 - 32 mmol/L   Glucose, Bld 242 (H) 70 - 99 mg/dL   BUN 11 6 - 20 mg/dL   Creatinine, Ser 1.09 (H) 0.44 - 1.00 mg/dL   Calcium 8.5 (L) 8.9 - 10.3 mg/dL   GFR calc non Af Amer 55 (L) >60 mL/min   GFR  calc Af Amer >60 >60 mL/min   Anion gap 11 5 - 15    Comment: Performed at [facility] [address], [place], [place] [address]  Lactic acid, plasma     Status: None   Collection Time: 10/17/19  9:50 AM  Result Value Ref Range   Lactic Acid, Venous 1.5 0.5 - 1.9 mmol/L    Comment: Performed at [facility] [address], [place] [address]  CBC     Status: Abnormal   Collection Time: 10/17/19  9:50 AM  Result Value Ref Range   WBC 19.7 (H)  4.0 - 10.5 K/uL   RBC 3.85 (L) 3.87 - 5.11 MIL/uL   Hemoglobin 11.7 (L) 12.0 - 15.0 g/dL   HCT 36.5 36.0 - 46.0 %   MCV 94.8 80.0 - 100.0 fL   MCH 30.4 26.0 - 34.0 pg   MCHC 32.1 30.0 - 36.0 g/dL   RDW 14.7 11.5 - 15.5 %   Platelets 182 150 - 400 K/uL   nRBC 0.0 0.0 - 0.2 %    Comment: Performed at [facility], [place] [address] [place], [place] [address]  CBG monitoring, ED     Status: Abnormal   Collection Time: 10/17/19  3:19 PM  Result Value Ref Range   Glucose-Capillary 224 (H) 70 - 99 mg/dL   *Note: Due to a large number of results and/or encounters for the requested time period, some results have not been displayed. A complete set of results can be found in Results Review.   Dg Chest Port 1 View  Result Date: 10/17/2019 CLINICAL DATA:  58 year old female with fever and cough. EXAM: PORTABLE CHEST 1 VIEW COMPARISON:  Chest radiograph dated 05/11/2019 FINDINGS: There is mild cardiomegaly with mild vascular congestion. Bibasilar densities, right greater left concerning for infiltrate. Clinical correlation and follow-up to resolution recommended. There is no large pleural effusion. No pneumothorax. Atherosclerotic calcification of the aorta. No acute osseous pathology. IMPRESSION: 1. Mild cardiomegaly with mild vascular congestion. 2. Bibasilar densities, right greater left concerning for infiltrate. Clinical correlation and follow-up to resolution recommended. Electronically Signed   By: Anner Crete M.D.   On: 10/17/2019 00:49    Pending Labs Unresulted Labs (From admission, onward)    Start     Ordered   10/18/19 0500  CBC  Tomorrow morning,   R     10/17/19 0259   10/18/19 [id number]  Basic metabolic panel  Tomorrow morning,   R     10/17/19 0259   10/17/19 1133  Legionella Pneumophila Serogp 1 Ur Ag  Once,   STAT     10/17/19 1132   10/17/19 1132  Strep pneumoniae urinary antigen  Once,   STAT     10/17/19 1132   10/17/19 0008  Blood Culture (routine x 2)  BLOOD CULTURE  X 2,   STAT     10/17/19 0009   10/17/19 0008  Urine culture  ONCE - STAT,   STAT     10/17/19 0009          Vitals/Pain Today's Vitals   10/17/19 1445 10/17/19 1500 10/17/19 1515 10/17/19 1530  BP: (!) 143/52 (!) 144/54 (!) 142/57 (!) 152/65  Pulse: 81 80 80 82  Resp: 18 19 (!) 26 (!) 27  Temp:      TempSrc:      SpO2: 93% 95% 96% 93%  Weight:      Height:  PainSc:        Isolation Precautions No active isolations  Medications Medications  oxyCODONE-acetaminophen (PERCOCET/ROXICET) 5-325 MG per tablet 1 tablet (has no administration in time range)  rosuvastatin (CRESTOR) tablet 20 mg (has no administration in time range)  buPROPion (WELLBUTRIN XL) 24 hr tablet 300 mg (300 mg Oral Given 10/17/19 1140)  pantoprazole (PROTONIX) EC tablet 40 mg (40 mg Oral Given 10/17/19 0903)  darifenacin (ENABLEX) 24 hr tablet 7.5 mg (has no administration in time range)  umeclidinium-vilanterol (ANORO ELLIPTA) 62.5-25 MCG/INH 1 puff (1 puff Inhalation Not Given 10/17/19 0835)  enoxaparin (LOVENOX) injection 40 mg (has no administration in time range)  sodium chloride flush (NS) 0.9 % injection 3 mL (3 mLs Intravenous Given 10/17/19 0307)  ipratropium-albuterol (DUONEB) 0.5-2.5 (3) MG/3ML nebulizer solution 3 mL (3 mLs Nebulization Given 10/17/19 1300)  acetaminophen (TYLENOL) tablet 650 mg (has no administration in time range)    Or  acetaminophen (TYLENOL) suppository 650 mg (has no administration in time range)  insulin aspart (novoLOG) injection 0-20 Units (7 Units Subcutaneous Given 10/17/19 1523)  insulin glargine (LANTUS) injection 30 Units (30 Units Subcutaneous Given 10/17/19 1520)  insulin aspart (novoLOG) injection 0-5 Units (has no administration in time range)  cefTRIAXone (ROCEPHIN) 1 g in sodium chloride 0.9 % 100 mL IVPB (has no administration in time range)  azithromycin (ZITHROMAX) tablet 250 mg (has no administration in time range)  predniSONE (DELTASONE) tablet 40  mg (40 mg Oral Given 10/17/19 0902)  perflutren lipid microspheres (DEFINITY) IV suspension (2 mLs Intravenous Given 10/17/19 1428)  acetaminophen (TYLENOL) tablet 650 mg (650 mg Oral Given 10/17/19 0022)  albuterol (VENTOLIN HFA) 108 (90 Base) MCG/ACT inhaler 4 puff (4 puffs Inhalation Given 10/17/19 0024)  furosemide (LASIX) injection 20 mg (20 mg Intravenous Given 10/17/19 0258)    Mobility walks with device Low fall risk   Focused Assessments Cardiac Assessment Handoff:    Lab Results  Component Value Date   CKTOTAL 83 04/05/2019   TROPONINI <0.03 03/25/2019   Lab Results  Component Value Date   DDIMER 1.38 (H) 09/16/2013   Does the Patient currently have chest pain? No     R Recommendations: See Admitting Provider Note  Report given to:   Additional Notes: .

## 2019-10-17 NOTE — Progress Notes (Signed)
UPDATE NOTE This is a 58 year old female with pertinent past medical history of COPD, OSA and chronic diastolic heart failure who presented to the ED with 1 month of progressive cough and shortness of breath.  Upon arrival to the ED patient was found to be febrile with a T-max of 102.6 F and hypoxic requiring 6 L nasal cannula.  Patient was noted to have a leukocytosis--white count 20.  Chest x-ray in the ED revealed bilateral infiltrates possibly concerning for pneumonia.  She subsequently started on Rocephin and azithromycin.  BNP in the ED was only mildly elevated at 300.  UA was also significant for findings consistent with urinary tract infection Patient was evaluated in the ED this morning by myself and the internal medicine teaching team.  Patient was responsive to voice however did appear acutely ill.  At that time, she was requiring 10 L supplemental oxygen via nonrebreather.  Blood pressures have remained stable however this is without her being on her chronic antihypertensives indicating that her blood pressure is lower than normal.  She is not tachycardic at time of exam.  Her heart was regular rate and rhythm.  She did have +2 pitting edema to the bilateral lower extremities.  Rhonchi were appreciated throughout the lung fields.  Did not appreciate any wheezing. Given her apparent increased requirement of supplemental oxygen to 10 L from 6 L a few hours prior, repeat EKG, ABG, CBC, BMP and lactate were obtained.  No significant findings on EKG.  ABG was essentially unremarkable aside from hypoxemia.  CBC remained significant for a leukocytosis.  Lactate still normal.  Reassuring that she is less likely to be septic given normal lactate and stable vitals.  No significant changes in her BMP from midnight.  Slight improvement in AKI. Plan: Lower extremity venous duplex ordered to rule out DVT.  Echo ordered for heart failure evaluation as it does not appear that she has had one since at least 2014.   Will obtain Legionella and strep pneumoniae urine studies.  We will continue the azithromycin and Rocephin which will cover for the urinary tract infection and atypical pneumonia.  We will continue to monitor throughout the day.  Given her current somnolence along with increasing oxygen requirement, will have a fairly low threshold to call critical care for further evaluation.

## 2019-10-17 NOTE — Progress Notes (Signed)
  Date: 10/17/2019  Patient name: Jennifer [name]  Medical record number: [id number]  Date of birth: October 09, 1961        I have seen and evaluated this patient and I have discussed the plan of care with the house staff. Please see their note for complete details. I concur with their findings.  Please see my attested H&P from earlier today.  Velna Ochs, MD 10/17/2019, 6:19 PM

## 2019-10-17 NOTE — Telephone Encounter (Signed)
Done

## 2019-10-17 NOTE — Progress Notes (Signed)
  Echocardiogram 2D Echocardiogram has been performed.  Matilde Bash 10/17/2019, 2:25 PM

## 2019-10-17 NOTE — ED Notes (Signed)
Pt on 6L on simple mask

## 2019-10-17 NOTE — ED Notes (Signed)
Asked pt if she wanted to eat and she said no. I told pt to tell staff when she was hungry enough to eat.

## 2019-10-17 NOTE — H&P (Addendum)
Date: 10/17/2019               Patient Name:  Jennifer [name] MRN: [id number]  DOB: [birth year] Age / Sex: 58 y.o., female   PCP: Bartholomew Crews, MD         Medical Service: Internal Medicine Teaching Service         Attending Physician: Dr. Velna Ochs, MD    First Contact: Darrick Meigs, MD, Rylee Pager: [phone])  Second Contact: Sharon Seller DO, Jaimie PagerJari Pigg [phone])       After Hours (After 5p/  First Contact Pager: [phone]  weekends / holidays): Second Contact Pager: [phone]   Chief Complaint: shortness of breath  History of Present Illness: 58 y.o. yo female w/ PMH significant for COPD, uncontrolled DMII, PVD s/p left BKA, HTN, MDD, and HFpEF presenting with one month duration of productive cough and dyspnea that has been progressively worsening. Patient is a poor historian. History obtained via patient and chart review. Patient reports that over the past 3-4 days, her symptoms have escalated and she has generalized weakness to the extent that she is unable to transfer from her wheelchair to the toilet. Patient reports her productive cough has worsened and is producing a significant amount of phlegm that is yellow and brown. She also endorses fevers/chills, generalized body aches, headaches, dizziness/lightheadedness, eye pain, ear pain, throat pain, pleuritic chest pain, nausea w/o vomiting, abdominal pain, decreased PO intake, and diarrhea. She reports diarrhea was initially 3-4 watery bowel movements per day but has improved with imodium. She notes increased urinary frequency and mild dysuria, improved from prior UTI symptoms. She denies any recent sick contacts.    ED course:  Patient presented to the ED via EMS for worsening dyspnea, productive cough, and generalized weakness for one month duration. On presentation, patient SpO2 76% on RA that improved to 94% on 6L O2 via simple mask. Patient was febrile (102.33F) and tachypnic with significant  leukocytosis in the ED. BNP elevated. CXR with bibasilar atelectasis and pulmonary congestion concerning for pneumonia. Patient started on ceftriaxone and azithromycin and given one dose of Lasix. Patient admitted to internal medicine for further evaluation and management.    Meds:  Current Meds  Medication Sig  . Insulin Syringe-Needle U-100 31G X 15/64" 0.3 ML MISC Use with Humalog to correct blood sugar before meals three times a day     Allergies: Allergies as of 10/16/2019 - Review Complete 09/12/2019  Allergen Reaction Noted  . Iohexol Other (See Comments) 08/17/2007  . Ivp dye [iodinated diagnostic agents] Other (See Comments) 05/10/2013  . Abilify [aripiprazole] Other (See Comments) 11/09/2015  . Morphine sulfate Itching and Rash    Past Medical History:  Diagnosis Date  . Anginal pain [facility])    '3' of 10 ischemia ruled out 9/9   . Arthritis of lumbar spine   . Asthma   . Cataract   . CHF (congestive heart failure) ([place])   . 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 Chronic bronchitis ([place])    "I get it alot" (09/28/2013)  . Chronic diastolic heart failure (HCC)    grade 2 per 2D echocardiogram (01/2013)  . Chronic lower back pain   . Chronic osteomyelitis of foot (HCC)    chronic, right secondary to diabetic foot ulcers  . Chronic pain syndrome 12/03/2011   Likely secondary to depression, "fibromyalgia", neuropathy, and obesity. Lumbar MRI 2014 no sig change from prior (2008) : Stable hypertrophic  facet disease most notable at L4-5. Stable shallow left foraminal/extraforaminal disc protrusion at L4-5. No direct neural compression.      Marland Kitchen COPD 01/08/2007   PFT's 05/2007 : FEV1/FVC 82, FEV1 64% pred, FEF 25-75% 40% predicted, 16% improvement in FEV1 with bronchodilators.     . Depression   . Diabetes mellitus without complication ([place])    Type II  . Diabetic peripheral neuropathy ([place])   . DVT of upper extremity (deep vein thrombosis) ([place]) 03/11/2013    Secondary to PICC line. Right brachial vein, diagnosed on 03/10/2013 Coumadin for 3 months. End date 06/10/2013   . Dyspnea    "smoker"  . Environmental allergies    Hx: of  . Exertional shortness of breath   . Fatty liver 2003   observed on ultrasound abdomen  . Fibromyalgia   . GERD (gastroesophageal reflux disease)   . Glaucoma   . History of use of hearing aid   . Hyperlipidemia   . Hyperplastic colon polyp 12/2010   Per colonoscopy (12/2010) - Dr. Deatra Ina  . Hypertension   . 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 Blood cultures 01/26/13 S. Aureus and GBS. Blood cultures Feb 6th, 8th, and 9th and March were negative.Repeat TEE 3/20 negative for vegitations  . 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 2/2 tobacco use, DM, and cont weight bearing on surgical wound and developed gangrene   . Pneumonia   . Polymicrobial bacterial infection 01/2013   GBS and S. aureus bacteremia // Source likely infected diabetic foot ulcer  . PVD (peripheral vascular disease) with claudication ([place])    Stents to bilateral common iliac arteries (left 2005, right 2008), on chronic plavix  . Rheumatoid arthritis ([place])   . S/P BKA (below knee amputation) unilateral [facility])    Oct 2014 L - failed limb preserving treatment. 2/2 tobacco use, DM, and cont weight bearing on surgical wound and developed gangrene   . Tobacco abuse   . Type II diabetes mellitus with peripheral circulatory disorders, uncontrolled DX: 1993   Insulin dep. Poor control. Complicated by diabetic foot ulcer and diabetic eye disease.    Marland Kitchen Ulcer of foot, chronic (HCC)    Left. No OM per MRI (01/2013)    Family History:  Family History  Problem Relation Age of Onset  . Diverticulosis Mother   . Diabetes Mother   . Hypertension Mother   . Congestive Heart Failure Mother   . Asthma Father   . CAD Sister 23        MI at age 82 per patient.  However, she has not had a stent or CABG.   . Heart disease Sister        before age 74  . Breast cancer Neg Hx      Social History:  Social History   Tobacco Use  . Smoking status: Current Every Day Smoker    Packs/day: 1.00    Years: 50.00    Pack years: 50.00    Types: Cigarettes  . Smokeless tobacco: Never Used  . Tobacco comment: 1 PPD  Substance Use Topics  . Alcohol use: No    Alcohol/week: 0.0 standard drinks  . Drug use: No    Types: Marijuana, "Crack" cocaine    Comment: 09/28/2013 "no marijuana since 2011, no crack/cocaine 1989"     Review of Systems: A complete ROS was negative except as  per HPI.  Physical Exam: Blood pressure 127/67, pulse 83, temperature (!) 102.6 F (39.2 C), temperature source Oral, resp. rate (!) 26, height 5\' 7"  (1.702 m), weight 99.8 kg, SpO2 92 %. Physical Exam Vitals signs and nursing note reviewed.  Constitutional:      General: She is not in acute distress.    Appearance: She is obese. She is ill-appearing. She is not diaphoretic.  HENT:     Head: Normocephalic and atraumatic.  Eyes:     Extraocular Movements: Extraocular movements intact.  Cardiovascular:     Rate and Rhythm: Normal rate and regular rhythm.     Pulses: Normal pulses.     Heart sounds: Normal heart sounds. No murmur. No friction rub. No gallop.   Pulmonary:     Effort: Pulmonary effort is normal. No accessory muscle usage or respiratory distress.     Breath sounds: Examination of the right-upper field reveals wheezing and rales. Examination of the left-upper field reveals wheezing and rales. Examination of the right-lower field reveals decreased breath sounds. Examination of the left-lower field reveals decreased breath sounds. Decreased breath sounds, wheezing and rales present.  Chest:     Chest wall: No mass, tenderness or crepitus.  Abdominal:     General: Bowel sounds are normal.     Palpations: Abdomen is soft.     Tenderness:  There is abdominal tenderness. There is no guarding or rebound.     Comments: Tenderness to palpation of the epigastric and periumbilical region   Musculoskeletal: Normal range of motion.     Comments: RLE: minimal pitting edema to the ankle LLE: BKA without s/s of infection   Skin:    General: Skin is warm and dry.     Capillary Refill: Capillary refill takes less than 2 seconds.  Neurological:     General: No focal deficit present.     Mental Status: She is alert and oriented to person, place, and time.    EKG: personally reviewed my interpretation is poor quality; however does not appear to have any acute ST segment elevations or depressions. Will need to repeat.   CXR: personally reviewed my interpretation is bibasilar opacification with blunting of costophrenic angles and pulmonary edema.    Assessment & Plan by Problem:  Patient is a 58yo female with PMHx of COPD, uncontrolled DMII, PVD s/p left BKA in 2014, HTN, and CHF presenting with progressively worsening  productive cough, dyspnea and generalized weakness with fever, leukocytosis and new O2 requirement. CXR concerning for pneumonia vs CHF exacerbation.   Acute hypoxic respiratory failure 2/2 CAP vs. COPD exacerbation:  Patient with history of COPD with cough productive of yellow/brown sputum, increasing dyspnea and generalized weakness requiring 6L O2 via simple mask saturating 89-91%.  Tmax of 102.6. Diminished breath sounds at bilateral bases with diffuse wheezing and crackles in bilateral upper lung fields. Neutrophil predominant leukocytosis on CBC. CXR with bibasilar pneumonia. Patient received one dose of azithromycin and rocephin in the ED.  - Continue azithromycin and rocephin - Prednisone 40mg  qd - Duoneb 67ml q6h - Anoro-Ellipta 1 puff qd - CBC - f/u blood cx   ?CHF exacerbation:  Patient has Hx of CHF (unknown EF) and takes Lasix as needed. Patient reports last taking her Lasix the day prior to admission. Unable  to assess accurate weight at this time. In the ED, patient had pitting edema of RLE for which she received one dose of Lasix. On exam, patient has minimal edema of RLE up to  ankle. She has diffuse crackles and wheezing in bilateral upper lung fields. BNP elevated 296 (baseline 54). CXR with pulmonary edema.  - Daily weights - Strict I/O  - BMP - Cardiac monitoring   Hypertension:  Patient with history of HTN on amlodipine 10mg . Patient currently normotensive. -Holding home BP meds  Diabetes mellitus:  Patient with history of poorly controlled DM. Patient is on metformin, tresiba 70U, Humalog SSI, and  ozempic, Patient reports baseline CBG's at home in upper 200s. However, on presentation, CBG >400.  - CBG monitoring  - Lantus 30U + SSI resistant   Recurrent UTI: Patient has history of recurrent UTIs. Most recent one was E.coli susceptible to cephalexin. Patient has completed course of antibiotics. Today, patient continues to complain of urinary frequency and dysuria. UA with nitrites and bacteria. Patient already on ceftriaxone.  - f/u urine culture   Diet: NPO Fluids: None  Code: FULL  VTE Prophylaxis: Lovenox   Dispo: Admit patient to Inpatient with expected length of stay greater than 2 midnights.  Signed: Harvie Heck, MD  Internal Medicine PGY-1 10/17/2019, 3:17 AM  Pager: [phone]

## 2019-10-18 ENCOUNTER — Telehealth: Payer: Self-pay | Admitting: Dietician

## 2019-10-18 DIAGNOSIS — N183 Chronic kidney disease, stage 3 unspecified: Secondary | ICD-10-CM

## 2019-10-18 DIAGNOSIS — E1122 Type 2 diabetes mellitus with diabetic chronic kidney disease: Secondary | ICD-10-CM | POA: Diagnosis present

## 2019-10-18 DIAGNOSIS — N182 Chronic kidney disease, stage 2 (mild): Secondary | ICD-10-CM | POA: Diagnosis present

## 2019-10-18 DIAGNOSIS — I130 Hypertensive heart and chronic kidney disease with heart failure and stage 1 through stage 4 chronic kidney disease, or unspecified chronic kidney disease: Secondary | ICD-10-CM

## 2019-10-18 LAB — CBC
HCT: 38.8 % (ref 36.0–46.0)
Hemoglobin: 12.4 g/dL (ref 12.0–15.0)
MCH: 30.4 pg (ref 26.0–34.0)
MCHC: 32 g/dL (ref 30.0–36.0)
MCV: 95.1 fL (ref 80.0–100.0)
Platelets: 188 10*3/uL (ref 150–400)
RBC: 4.08 MIL/uL (ref 3.87–5.11)
RDW: 14.8 % (ref 11.5–15.5)
WBC: 18.9 10*3/uL — ABNORMAL HIGH (ref 4.0–10.5)
nRBC: 0 % (ref 0.0–0.2)

## 2019-10-18 LAB — URINE CULTURE

## 2019-10-18 LAB — CBG MONITORING, ED
Glucose-Capillary: 132 mg/dL — ABNORMAL HIGH (ref 70–99)
Glucose-Capillary: 238 mg/dL — ABNORMAL HIGH (ref 70–99)

## 2019-10-18 LAB — BASIC METABOLIC PANEL
Anion gap: 13 (ref 5–15)
BUN: 16 mg/dL (ref 6–20)
CO2: 24 mmol/L (ref 22–32)
Calcium: 8.8 mg/dL — ABNORMAL LOW (ref 8.9–10.3)
Chloride: 105 mmol/L (ref 98–111)
Creatinine, Ser: 1.24 mg/dL — ABNORMAL HIGH (ref 0.44–1.00)
GFR calc Af Amer: 55 mL/min — ABNORMAL LOW (ref >60)
GFR calc non Af Amer: 47 mL/min — ABNORMAL LOW (ref >60)
Glucose, Bld: 153 mg/dL — ABNORMAL HIGH (ref 70–99)
Potassium: 3.8 mmol/L (ref 3.5–5.1)
Sodium: 142 mmol/L (ref 135–145)

## 2019-10-18 LAB — LEGIONELLA PNEUMOPHILA SEROGP 1 UR AG: L. pneumophila Serogp 1 Ur Ag: NEGATIVE

## 2019-10-18 LAB — GLUCOSE, CAPILLARY
Glucose-Capillary: 307 mg/dL — ABNORMAL HIGH (ref 70–99)
Glucose-Capillary: 250 mg/dL — ABNORMAL HIGH (ref 70–99)

## 2019-10-18 MED ORDER — INSULIN GLARGINE 100 UNIT/ML ~~LOC~~ SOLN
35.0000 [IU] | Freq: Every day | SUBCUTANEOUS | Status: DC
  Administered 2019-10-19: 35 [IU] via SUBCUTANEOUS
  Filled 2019-10-18 (×4): qty 0.35

## 2019-10-18 NOTE — ED Notes (Signed)
Ordered breakfast tray for pt and brought her diet gingerale

## 2019-10-18 NOTE — Progress Notes (Signed)
NAME:  Jennifer [name], MRN:  [id number], DOB:  January 25, 1961, LOS: 1 ADMISSION DATE:  10/16/2019,  Primary: Bartholomew Crews, MD  CHIEF COMPLAINT:  Shortness of breath  Medical Service: Internal Medicine Teaching Service         Attending Physician: Dr. Velna Ochs, MD    First Contact: Dr. Darrick Meigs Pager: [phone]  Second Contact: Dr. Sharon Seller Pager: [phone]       After Hours (After 5p/  First Contact Pager: [phone]  weekends / holidays): Second Contact Pager: [phone]    Brief History  This is a 58 year old female with pertinent past medical history of COPD, OSA and chronic diastolic heart failure who presented to the ED with 1 month of progressive cough and shortness of breath.  Upon arrival to the ED patient was found to be febrile with a T-max of 102.6 F and hypoxic requiring 6 L nasal cannula.  Patient was noted to have a leukocytosis--white count 20.  Chest x-ray in the ED revealed bilateral infiltrates possibly concerning for pneumonia.  She subsequently started on Rocephin and azithromycin.  BNP in the ED was only mildly elevated at 300.  UA was also significant for findings consistent with urinary tract infection  Subjective  More awake and alert this morning.   Significant Hospital Events   10/26 Admission  Objective   Blood pressure 119/60, pulse 80, temperature 99 F (37.2 C), temperature source Oral, resp. rate (!) 24, height 5\' 7"  (1.702 m), weight 99.8 kg, SpO2 93 %.     Intake/Output Summary (Last 24 hours) at 10/18/2019 1335 Last data filed at 10/17/2019 2223 Gross per 24 hour  Intake --  Output 800 ml  Net -800 ml   Filed Weights   10/17/19 0014  Weight: 99.8 kg    Examination: General: In no acute distress.  Chronically ill-appearing Cardiac: Regular rate and rhythm.  +2 pitting edema to the lower extremity Pulmonary: Diffuse expiratory wheezes. Abdomen: Soft, nontender.  Bowel sounds active Musculoskeletal: Right BKA Neuro: Alert and  oriented.  Consults:  none  Significant Diagnostic Tests:  10/26 CXR: bibasilar opacities, right>left  10/26 ECHO: EF of 65 to 70%.  Mild left ventricular hypertrophy.  Diastolic function indeterminate.  No significant valvular abnormality Micro Data:  10/26 BC>> 10/26 UC>> multiple species present.  Likely a contaminant. Urine antigens -legionella>> -strep pneumo>> negative  Antimicrobials:  Azithromycin 10/26>> Rocephin 10/26>>  Labs    CBC CBC Latest Ref Rng & Units 10/18/2019 10/17/2019 10/17/2019  WBC 4.0 - 10.5 K/uL 18.9(H) 19.7(H) -  Hemoglobin 12.0 - 15.0 g/dL 12.4 11.7(L) 12.2  Hematocrit 36.0 - 46.0 % 38.8 36.5 36.0  Platelets 150 - 400 K/uL 188 182 -   BMP Latest Ref Rng & Units 10/18/2019 10/17/2019 10/17/2019  Glucose 70 - 99 mg/dL 153(H) 242(H) -  BUN 6 - 20 mg/dL 16 11 -  Creatinine 0.44 - 1.00 mg/dL 1.24(H) 1.09(H) -  BUN/Creat Ratio 12 - 28 - - -  Sodium 135 - 145 mmol/L 142 139 140  Potassium 3.5 - 5.1 mmol/L 3.8 3.9 3.6  Chloride 98 - 111 mmol/L 105 103 -  CO2 22 - 32 mmol/L 24 25 -  Calcium 8.9 - 10.3 mg/dL 8.8(L) 8.5(L) -      Summary  58 yo female with COPD, HFpEF, DM, HTN presenting with 4w hx of progressive cough, shortness of breath and sputum production. Found to have fever and leukocytosis in ED. COPD exac vs CAP vs CHF exacerbation. Additionally, UA findings  consistent with UTI.  Assessment & Plan:  Active Problems:   Acute hypoxemic respiratory failure (HCC)  Acute hypoxemic respiratory failure.  COPD exacerbation  CAP.  Initially required up to 10L HFNC which has been able to be titrated down to 5L now.  Afebrile overnight.  White count down to 19.  Appears much better today. Plan Continue azithromycin + rocephin + prednisone duonebs +ellipta Repeat cbc in am  Urinary tract infection. Hx of recurrent UTIs.  Urine culture returned with multiple species--likely contaminant plan: continue rocephin.  Last dose tomorrow  CKD 3.  GFR  stable.  We will continue to monitor OSA.  Stable and chronic.  Plan: We will order CPAP at bedtime HTN: continue holding antihypertensives. Hyperlipidemia: continue crestor DM II: Blood sugar still running in the 200s.  Will likely have temporary elevation in light of prednisone use plan: continue CBGs, sliding scale insulin.  Will increase Lantus to 35 units GERD-continue protonix Depression: continue wellbutrin  Best practice:  CODE STATUS: FULL Diet: Cardiac, diabetic Pain management: tylenol and Percocet  GI prophylaxis: protonix DVT for prophylaxis: lovenox Dispo: pending medical stabilization.   Mitzi Hansen, MD INTERNAL MEDICINE RESIDENT PGY-1 PAGER #: [phone] 10/18/19 1:35 PM

## 2019-10-18 NOTE — Telephone Encounter (Signed)
To say she is in the hospital

## 2019-10-18 NOTE — ED Notes (Signed)
Lunch Tray Ordered @ 1113.  

## 2019-10-18 NOTE — Progress Notes (Signed)
  Date: 10/18/2019  Patient name: Jennifer [name]  Medical record number: [id number]  Date of birth: 1961/07/29        I have seen and evaluated this patient and I have discussed the plan of care with the house staff. Please see their note for complete details. I concur with their findings.  Patient here with COPD exacerbation 2/2 to likely bacterial PNA improving on ceftriaxone, azithromycin, prednisone, and scheduled duonebs. Oxygen requirement improved from 10 -> 5L today. Continue weaning as able, out of bed if possible. PT / OT consult. Patient can eat.   Patient has CKD III with baseline SCr of ~ 1.1. Creatinine is slightly up today but still within her normal baseline range. Continue to monitor. If worsening renal function she may need IVFs.  Velna Ochs, MD 10/18/2019, 3:36 PM

## 2019-10-19 ENCOUNTER — Telehealth: Payer: Self-pay | Admitting: *Deleted

## 2019-10-19 ENCOUNTER — Encounter (HOSPITAL_COMMUNITY): Payer: Self-pay

## 2019-10-19 DIAGNOSIS — Z91041 Radiographic dye allergy status: Secondary | ICD-10-CM

## 2019-10-19 DIAGNOSIS — F332 Major depressive disorder, recurrent severe without psychotic features: Secondary | ICD-10-CM

## 2019-10-19 DIAGNOSIS — E1151 Type 2 diabetes mellitus with diabetic peripheral angiopathy without gangrene: Secondary | ICD-10-CM

## 2019-10-19 DIAGNOSIS — Z888 Allergy status to other drugs, medicaments and biological substances status: Secondary | ICD-10-CM

## 2019-10-19 DIAGNOSIS — Z885 Allergy status to narcotic agent status: Secondary | ICD-10-CM

## 2019-10-19 LAB — CBC
HCT: 36.8 % (ref 36.0–46.0)
Hemoglobin: 11.8 g/dL — ABNORMAL LOW (ref 12.0–15.0)
MCH: 29.6 pg (ref 26.0–34.0)
MCHC: 32.1 g/dL (ref 30.0–36.0)
MCV: 92.2 fL (ref 80.0–100.0)
Platelets: 216 10*3/uL (ref 150–400)
RBC: 3.99 MIL/uL (ref 3.87–5.11)
RDW: 14.6 % (ref 11.5–15.5)
WBC: 15.3 10*3/uL — ABNORMAL HIGH (ref 4.0–10.5)
nRBC: 0 % (ref 0.0–0.2)

## 2019-10-19 LAB — BASIC METABOLIC PANEL
Anion gap: 11 (ref 5–15)
BUN: 16 mg/dL (ref 6–20)
CO2: 25 mmol/L (ref 22–32)
Calcium: 9 mg/dL (ref 8.9–10.3)
Chloride: 104 mmol/L (ref 98–111)
Creatinine, Ser: 1.01 mg/dL — ABNORMAL HIGH (ref 0.44–1.00)
GFR calc Af Amer: >60 mL/min (ref >60)
GFR calc non Af Amer: >60 mL/min (ref >60)
Glucose, Bld: 136 mg/dL — ABNORMAL HIGH (ref 70–99)
Potassium: 3.8 mmol/L (ref 3.5–5.1)
Sodium: 140 mmol/L (ref 135–145)

## 2019-10-19 LAB — GLUCOSE, CAPILLARY
Glucose-Capillary: 120 mg/dL — ABNORMAL HIGH (ref 70–99)
Glucose-Capillary: 171 mg/dL — ABNORMAL HIGH (ref 70–99)

## 2019-10-19 MED ORDER — PREDNISONE 20 MG PO TABS: 40.0000 mg | ORAL_TABLET | Freq: Every day | ORAL | 0 refills | Status: AC

## 2019-10-19 MED ORDER — CEFDINIR 300 MG PO CAPS
300.0000 mg | ORAL_CAPSULE | Freq: Two times a day (BID) | ORAL | Status: DC
  Administered 2019-10-19: 300 mg via ORAL
  Filled 2019-10-19 (×2): qty 1

## 2019-10-19 MED ORDER — ATENOLOL 100 MG PO TABS
100.0000 mg | ORAL_TABLET | Freq: Every day | ORAL | Status: DC
  Administered 2019-10-19: 100 mg via ORAL
  Filled 2019-10-19: qty 1

## 2019-10-19 MED ORDER — AZITHROMYCIN 250 MG PO TABS: 250.0000 mg | ORAL_TABLET | Freq: Every day | ORAL | 0 refills | Status: AC

## 2019-10-19 MED ORDER — BENZONATATE 100 MG PO CAPS: 100.0000 mg | ORAL_CAPSULE | Freq: Every evening | ORAL | 0 refills | Status: DC | PRN

## 2019-10-19 MED ORDER — CEFDINIR 300 MG PO CAPS: 300.0000 mg | ORAL_CAPSULE | Freq: Two times a day (BID) | ORAL | 0 refills | Status: AC

## 2019-10-19 MED ORDER — INFLUENZA VAC SPLIT QUAD 0.5 ML IM SUSY
0.5000 mL | PREFILLED_SYRINGE | INTRAMUSCULAR | Status: AC
  Administered 2019-10-19: 0.5 mL via INTRAMUSCULAR

## 2019-10-19 MED ORDER — PHENOL 1.4 % MT LIQD: 1.0000 | OROMUCOSAL | Status: DC | PRN

## 2019-10-19 MED ORDER — IPRATROPIUM-ALBUTEROL 0.5-2.5 (3) MG/3ML IN SOLN
3.0000 mL | Freq: Three times a day (TID) | RESPIRATORY_TRACT | Status: DC
  Administered 2019-10-19 (×2): 3 mL via RESPIRATORY_TRACT
  Filled 2019-10-19 (×2): qty 3

## 2019-10-19 MED ORDER — ALBUTEROL SULFATE (2.5 MG/3ML) 0.083% IN NEBU: 2.5000 mg | INHALATION_SOLUTION | Freq: Four times a day (QID) | RESPIRATORY_TRACT | Status: DC | PRN

## 2019-10-19 NOTE — Progress Notes (Signed)
  Date: 10/19/2019  Patient name: Jennifer [name]  Medical record number: [id number]  Date of birth: [birth year]        I have seen and evaluated this patient and I have discussed the plan of care with the house staff. Please see their note for complete details. I concur with their findings.  Patient significantly improved today, now off oxygen (was requiring 10L high flow [place] on admission). We are treating her for bilateral pneumonia and COPD exacerbation. We will change her ceftriaxone to po cefdinir today and continue azithromycin. Ambulate patient today, if not desaturating will plan to discharge home to complete her treatment course.   Velna Ochs, MD 10/19/2019, 11:38 AM

## 2019-10-19 NOTE — Progress Notes (Addendum)
Patient expressed that she is not comfortable being discharged today. She relayed that her coughing episodes is causing throat pain  and she is having to catch her breathe. MD notified.

## 2019-10-19 NOTE — Consult Note (Signed)
   [facility] CM Inpatient Consult   10/19/2019  Jennifer [name] January 10, 1961 [id number]    Patient was screened for potential needs of THNcare managementservices for community follow-up as a benefit of her [employer] with 19% risk score for unplanned readmission and hospitalizations.   Patient had been previously engaged with [facility] care management for transitions of care, disease management of DM/ COPD, transportation/ community resources and medication adherence.   Review of chart and MD brief history reveals as follows: This is a 58 year old female with a pmhx of HTN, HLD, GERD, Type II DM, PVD s/p left BKA, COPD, OSA, and chronic diastolic heart failure;    who presented to the ED with 1 month of progressive cough and shortness of breath. Chest x-ray in the ED revealed bilateral infiltrates possibly concerning for pneumonia.UA was also significant for findings consistent with urinary tract infection.    Patient is here with COPD exacerbation 2/2 to likely bacterial PNA improving on ceftriaxone, azithromycin, prednisone, and scheduled duonebs. Oxygen requirement improved from 10 -> 5L today.  Called and spoke with patient in the room, after several attempts and staff assistance. HIPAA information obtained. Patient reportsthat she is getting ready to discharge to home and reports having no issues with medications (self managed); pharmacy and transportation. Prior to admission, she lives with her son who assists with her ADL. Discussed withpatient regarding THN CM serviceswhich areavailable to helpmaintain health/managehealth needs/ concerns andtoprevent readmissions. Patient expressed interest with theservices shecould get to assist herwith her care needs.Patientagreed with phone call follow-up to discuss ways in managing herchronic medical conditions such as COPD/ PNA,whichresulted to this admission. Patient has a [facility] consent on file.  Patient's primary care  provider is Dr. Larey Dresser with [facility] Internal Medicine,listedas providing transition of care.  Verbal consentprovided by patientforTHNCMservices.  Referral made to [facility] RNcaremanagementcoordinator for complex care and disease managementneeds forCOPD and to assess further needs afterdischarge.  Per patient's RN, will notify Transition of care CM that patient will need arrangement for home health services upon discharge; and [facility] care management will be following post discharge.  Of note, [facility] Care Management services does not replace or interfere with any services that are arranged by transition of care case management or social work.    For questionsand additional information,please contact:  Blakeleigh Domek A. Xiong Haidar, BSN, RN-BC [facility] Liaison Cell: [phone]

## 2019-10-19 NOTE — Progress Notes (Signed)
NAME:  Jennifer [name], MRN:  [id number], DOB:  05/31/1961, LOS: 2 ADMISSION DATE:  10/16/2019,  Primary: Bartholomew Crews, MD  CHIEF COMPLAINT:  Shortness of breath  Medical Service: Internal Medicine Teaching Service         Attending Physician: Dr. Velna Ochs, MD    First Contact: Dr. Darrick Meigs Pager: [phone]  Second Contact: Dr. Sharon Seller Pager: [phone]       After Hours (After 5p/  First Contact Pager: [phone]  weekends / holidays): Second Contact Pager: [phone]    Brief History  This is a 58 year old female with pertinent past medical history of COPD, OSA and chronic diastolic heart failure who presented to the ED with 1 month of progressive cough and shortness of breath.  Upon arrival to the ED patient was found to be febrile with a T-max of 102.6 F and hypoxic requiring 6 L nasal cannula.  Patient was noted to have a leukocytosis--white count 20.  Chest x-ray in the ED revealed bilateral infiltrates possibly concerning for pneumonia.  She subsequently started on Rocephin and azithromycin.  BNP in the ED was only mildly elevated at 300.  UA was also significant for findings consistent with urinary tract infection  Subjective  Patient appears to be feeling quite a bit better this morning.  Patient was now wearing nasal cannula during her visit and she was able to use a loud voice to express her concerns without becoming short of breath.  We discussed these as well as plan for possible discharge later today.  She was also upset about this and said she did not feel like leaving today and that she had a right to stay in the hospital.  We discussed this further and she had no further questions at the end.  Significant Hospital Events   10/26 Admission  Objective   Blood pressure (!) 143/53, pulse 73, temperature 98.2 F (36.8 C), temperature source Oral, resp. rate 18, height 5\' 7"  (1.702 m), weight 106 kg, SpO2 (!) 89 %.     Intake/Output Summary (Last 24 hours) at  10/19/2019 0532 Last data filed at 10/19/2019 0355 Gross per 24 hour  Intake 120 ml  Output 650 ml  Net -530 ml   Filed Weights   10/17/19 0014 10/18/19 1647  Weight: 99.8 kg 106 kg    Examination: General: In no acute distress Cardiac: Heart regular rate and rhythm Pulmonary: Mild diffuse wheezing.  Breathing room air. Abdomen: Soft nontender Skin: No rash Psych: Mood labile  Consults:  none  Significant Diagnostic Tests:  10/26 CXR: bibasilar opacities, right>left  10/26 ECHO: EF of 65 to 70%.  Mild left ventricular hypertrophy.  Diastolic function indeterminate.  No significant valvular abnormality Micro Data:  10/26 BC>>NGTD 10/26 UC>> multiple species present.  Likely a contaminant. Urine antigens -legionella>>neg -strep pneumo>> negative  Antimicrobials:  Azithromycin 10/26>> Rocephin 10/26>>10/28 Cefdinir 10/28>>  Labs    CBC CBC Latest Ref Rng & Units 10/19/2019 10/18/2019 10/17/2019  WBC 4.0 - 10.5 K/uL 15.3(H) 18.9(H) 19.7(H)  Hemoglobin 12.0 - 15.0 g/dL 11.8(L) 12.4 11.7(L)  Hematocrit 36.0 - 46.0 % 36.8 38.8 36.5  Platelets 150 - 400 K/uL 216 188 182   BMP Latest Ref Rng & Units 10/18/2019 10/17/2019 10/17/2019  Glucose 70 - 99 mg/dL 153(H) 242(H) -  BUN 6 - 20 mg/dL 16 11 -  Creatinine 0.44 - 1.00 mg/dL 1.24(H) 1.09(H) -  BUN/Creat Ratio 12 - 28 - - -  Sodium 135 - 145 mmol/L 142 139  140  Potassium 3.5 - 5.1 mmol/L 3.8 3.9 3.6  Chloride 98 - 111 mmol/L 105 103 -  CO2 22 - 32 mmol/L 24 25 -  Calcium 8.9 - 10.3 mg/dL 8.8(L) 8.5(L) -      Summary  58 yo female with COPD, HFpEF, DM, HTN presenting with 4w hx of progressive cough, shortness of breath and sputum production. Found to have fever and leukocytosis in ED. COPD exac vs CAP vs CHF exacerbation. Additionally, UA findings consistent with UTI.  Assessment & Plan:  Active Problems:   DM (diabetes mellitus) type II uncontrolled, periph vascular disorder (HCC)   Depression, major, severe  recurrence (HCC)   OSA (obstructive sleep apnea)   Acute hypoxemic respiratory failure (HCC)   CKD (chronic kidney disease) stage 3, GFR 30-59 ml/min  Acute hypoxemic respiratory failure.  COPD exacerbation  CAP.  Breathing room air today. Afebrile overnight.  Leukocytosis improving.  Plan Continue azithromycin + cefdinir day #3/5 duonebs +ellipta  CKD 3.  GFR stable.  We will continue to monitor OSA.  Stable and chronic.  Plan: We will order CPAP at bedtime HTN: blood pressures now elevated. Will start to add back home meds. Hyperlipidemia: continue crestor DM II: Blood sugar still running in the 200s.  Will likely have temporary elevation in light of prednisone use plan: continue CBGs, sliding scale insulin. Lantus 35 units GERD-continue protonix Depression: continue wellbutrin  Best practice:  CODE STATUS: FULL Diet: Cardiac, diabetic Pain management: tylenol and Percocet  GI prophylaxis: protonix DVT for prophylaxis: lovenox Dispo: pending medical stabilization.   Mitzi Hansen, MD INTERNAL MEDICINE RESIDENT PGY-1 PAGER #: [phone] 10/19/19 5:32 AM

## 2019-10-19 NOTE — Discharge Summary (Addendum)
Name: Jennifer [name] MRN: [id number] DOB: 05-31-1961 58 y.o. PCP: Bartholomew Crews, MD  Date of Admission: 10/16/2019 11:50 PM Date of Discharge: 10/19/19 Attending Physician: Velna Ochs, MD  Discharge Diagnosis: 1. COPD exacerbation 2. Pneumonia 3. Physical deconditioning  Discharge Medications: Allergies as of 10/19/2019      Reactions   Iohexol Other (See Comments)    Desc: IV CONTRAST CAUSE NEPHROPATHY IN 2007   Ivp Dye [iodinated Diagnostic Agents] Other (See Comments)   Desc: IV CONTRAST CAUSE NEPHROPATHY IN 2007   Abilify [aripiprazole] Other (See Comments)   Urinary freq Nov 2016   Morphine Sulfate Itching, Rash      Medication List    STOP taking these medications   ciclopirox 0.77 % cream Commonly known as: LOPROX   silver sulfADIAZINE 1 % cream Commonly known as: Silvadene   solifenacin 5 MG tablet Commonly known as: VESICARE     TAKE these medications   albuterol 108 (90 Base) MCG/ACT inhaler Commonly known as: VENTOLIN HFA Inhale 1-2 puffs into the lungs every 4 (four) hours as needed for wheezing or shortness of breath. What changed: Another medication with the same name was changed. Make sure you understand how and when to take each.   albuterol (2.5 MG/3ML) 0.083% nebulizer solution Commonly known as: PROVENTIL INHALE THE CONTENTS OF 1 VIAL VIA NEBULIZER EVERY 6 HOURS AS NEEDED FOR WHEEZING What changed: See the new instructions.   amLODipine 10 MG tablet Commonly known as: NORVASC Take 1 tablet (10 mg total) by mouth daily.   Anoro Ellipta 62.5-25 MCG/INH Aepb Generic drug: umeclidinium-vilanterol TAKE 1 PUFF BY MOUTH EVERY DAY What changed: See the new instructions.   atenolol 100 MG tablet Commonly known as: TENORMIN TAKE 1 TABLET BY MOUTH EVERY DAY   azithromycin 250 MG tablet Commonly known as: ZITHROMAX Take 1 tablet (250 mg total) by mouth daily for 2 days.   baclofen 10 MG tablet Commonly known as:  LIORESAL TAKE 1 TABLET BY MOUTH THREE TIMES A DAY AS NEEDED FOR MUSCLE SPASMS What changed: See the new instructions.   benzonatate 100 MG capsule Commonly known as: Tessalon Perles Take 1 capsule (100 mg total) by mouth at bedtime as needed for cough.   betamethasone dipropionate 0.05 % cream Apply 1 application topically 2 (two) times daily as needed (irritation).   buPROPion 300 MG 24 hr tablet Commonly known as: WELLBUTRIN XL TAKE 1 TABLET BY MOUTH DAILY   cefdinir 300 MG capsule Commonly known as: OMNICEF Take 1 capsule (300 mg total) by mouth every 12 (twelve) hours for 2 days.   cholecalciferol 25 MCG (1000 UT) tablet Commonly known as: VITAMIN D3 Take 1,000 Units by mouth daily.   DULoxetine 30 MG capsule Commonly known as: CYMBALTA TAKE 1 CAPSULE BY MOUTH EVERY DAY   fluticasone 50 MCG/ACT nasal spray Commonly known as: FLONASE INSTILL 1 SPRAY IN EACH NOSTRIL DAILY   furosemide 40 MG tablet Commonly known as: LASIX TAKE ONE TABLET BY MOUTH DAILY AS NEEDED What changed: reasons to take this   insulin lispro 100 UNIT/ML KwikPen Commonly known as: HumaLOG KwikPen If blood sugar is < 175 Do not take any correction insulin. 176-225 inject 2 units. 226-275 inject 4 units. [id number] inject 6 units. 326-375 inject 8 units. 376-425 inject 10 units. 426-475 inject 12 units. [id number] inject 14 units and call office for further instructions. What changed:   how much to take  how to take this  when to take this  Insulin Pen Needle 32G X 4 MM Misc Use to inject insulin 4 times a day and semaglutide once weekly   Insulin Syringe-Needle U-100 31G X 15/64" 0.3 ML Misc Use with Humalog to correct blood sugar before meals three times a day   ipratropium 0.02 % nebulizer solution Commonly known as: ATROVENT USE 1 VIAL VIA NEBULIZER EVERY 6 HOURS AS NEEDED FOR WHEEZING   metFORMIN 500 MG 24 hr tablet Commonly known as: GLUCOPHAGE-XR TAKE 1 TABLET BY MOUTH EVERY DAY WITH  BREAKFAST What changed: See the new instructions.   multivitamin with minerals Tabs tablet Take 1 tablet by mouth daily.   Myrbetriq 50 MG Tb24 tablet Generic drug: mirabegron ER TAKE 1 TABLET BY MOUTH DAILY   nystatin powder Commonly known as: nystatin APPLY TO AFFECTED AREA THREE TIMES A DAY What changed:   how much to take  how to take this  when to take this   omeprazole 20 MG capsule Commonly known as: PRILOSEC TAKE 1 CAPSULE (20 MG TOTAL) BY MOUTH 2 (TWO) TIMES DAILY BEFORE A MEAL.   oxyCODONE-acetaminophen 5-325 MG tablet Commonly known as: PERCOCET/ROXICET Take 1 tablet by mouth every 8 (eight) hours as needed. May take a 4th pill if severe pain (up to 10 days monthly) #100 equals 30 day supply   Ozempic (0.25 or 0.5 MG/DOSE) 2 MG/1.5ML Sopn Generic drug: Semaglutide(0.25 or 0.5MG /DOS) Inject 0.5 mg into the skin once a week. What changed: when to take this   predniSONE 20 MG tablet Commonly known as: DELTASONE Take 2 tablets (40 mg total) by mouth daily with breakfast for 2 days. Start taking on: October 20, 2019   pregabalin 200 MG capsule Commonly known as: LYRICA Take 200 mg by mouth 3 (three) times daily.   rosuvastatin 20 MG tablet Commonly known as: CRESTOR TAKE 1 TABLET BY MOUTH AT BEDTIME What changed: when to take this   traZODone 100 MG tablet Commonly known as: DESYREL Take 100 mg by mouth at bedtime.   [name] FlexTouch 200 UNIT/ML Sopn Generic drug: Insulin Degludec Inject 70 Units into the skin daily.   vitamin B-12 500 MCG tablet Commonly known as: CYANOCOBALAMIN Take 500 mcg by mouth daily.   vitamin C 500 MG tablet Commonly known as: ASCORBIC ACID Take 500 mg by mouth daily.       Disposition and follow-up:   Jennifer [name] was discharged from [facility] in Stable condition.  At the hospital follow up visit please address:  COPD exacerbation. Pneumonia. Being discharged with antibiotics and  prednisone to complete 5d course.   Physical deconditioning. PT home health ordered at discharge.  Labs / imaging needed at time of follow-up: CBC, UA/UC   Follow-up Appointments: PCP within 3-5d of discharge   Hospital Course by problem list: This is a 58 year old female with COPD and OSA who presented to [facility] ED for evaluation of progressive shortness of breath, productive cough and weakness over the past month. Patient was febrile on admission with Tmax of 102.62F and hypoxic requiring BiPAP. White count on admission 20k. CXR in ED revealed bilateral infiltrates that were predominately on the right consistent with pneumonia. She initally struggled maintaining adequate blood pressure however responded appropriately with IVF. She was placed on prednisone, rocephin and azithromycin which was transitioned to po prednisone, azithromycin and cefdinir prior to discharge. On day of discharge, her leukocytosis had improved and she was afebrile. She was breathing comfortably on room air, maintaining O2 sats>96%. She  was instructed to continue the antibiotic course and to follow up with her PCP in 3-5d.  It was also revealed, during her hospitalization, that she had deconditioning. It was recommended that she receive home health PT for further strengthening.  Patient had also noted some dysuria initially on admission which resolved at time of discharge. Urine culture obtained during hospitalization grew multiple species and was likely a contaminate. No further culture was obtained as she is currently on antibiotics and symptoms had resolved.  Discharge Vitals:   BP (!) 154/66 (BP Location: Right Arm)   Pulse 78   Temp 97.8 F (36.6 C) (Oral)   Resp 18   Ht 5\' 7"  (1.702 m)   Wt 107.3 kg   SpO2 94%   BMI 37.05 kg/m   Pertinent Labs, Studies, and Procedures:  10/26 CXR: bibasilar densities worse on right 10/26 blood cultures: No growth to date 10/26 urine culture: numerous species  present--likely contaminate. CBC Latest Ref Rng & Units 10/19/2019 10/18/2019 10/17/2019  WBC 4.0 - 10.5 K/uL 15.3(H) 18.9(H) 19.7(H)  Hemoglobin 12.0 - 15.0 g/dL 11.8(L) 12.4 11.7(L)  Hematocrit 36.0 - 46.0 % 36.8 38.8 36.5  Platelets 150 - 400 K/uL 216 188 182   BMP Latest Ref Rng & Units 10/19/2019 10/18/2019 10/17/2019  Glucose 70 - 99 mg/dL 136(H) 153(H) 242(H)  BUN 6 - 20 mg/dL 16 16 11   Creatinine 0.44 - 1.00 mg/dL 1.01(H) 1.24(H) 1.09(H)  BUN/Creat Ratio 12 - 28 - - -  Sodium 135 - 145 mmol/L 140 142 139  Potassium 3.5 - 5.1 mmol/L 3.8 3.8 3.9  Chloride 98 - 111 mmol/L 104 105 103  CO2 22 - 32 mmol/L 25 24 25   Calcium 8.9 - 10.3 mg/dL 9.0 8.8(L) 8.5(L)     Discharge Instructions: Discharge Instructions    Call MD for:  difficulty breathing, headache or visual disturbances   Complete by: As directed    Call MD for:  persistant dizziness or light-headedness   Complete by: As directed    Call MD for:  persistant nausea and vomiting   Complete by: As directed    Diet - low sodium heart healthy   Complete by: As directed    Increase activity slowly   Complete by: As directed       Signed: Mitzi Hansen, MD 10/19/2019, 3:25 PM   Pager: [phone]

## 2019-10-19 NOTE — TOC Progression Note (Addendum)
Transition of Care [facility]) - Progression Note    Patient Details  Name: Jennifer [name] MRN: [id number] Date of Birth: 1961/08/23  [facility] CM/SW Contact  Zenon Mayo, RN Phone Number: 10/19/2019, 5:30 PM  Clinical Narrative:    Patient dc today, will need HHPT, set up , NCM went to room patient was discharged, NCM will call patient tomorrow. Also need 3 n 1.  Patient states she has no preference for [facility] angency.  NCM made referral to [facility] with [name] ,he was able to take patient with soc on Monday.  Also 3 n 1 will be delivered to patient 's home  Thru [employer].  MD wrote paper script for 3 n 1 and NCM faxed to [employer].         Expected Discharge Plan and Services           Expected Discharge Date: 10/19/19                                     Social Determinants of Health (SDOH) Interventions    Readmission Risk Interventions No flowsheet data found.

## 2019-10-19 NOTE — Evaluation (Signed)
Physical Therapy Evaluation Patient Details Name: Jennifer [name] MRN: [id number] DOB: 1961-11-11 Today's Date: 10/19/2019   History of Present Illness  Pt is a 57 y.o. female admitted 10/16/19 with COPD exacerbation likely secondary to bacterial PNA. 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    Clinical Impression  Pt presents with an overall decrease in functional mobility secondary to above. PTA, pt mod indep with RW and w/c, lives with family who assists with household tasks/ADLs as needed. Today, pt with SpO2 91-98% on RA with sitting activity; pt with productive cough throughout session. Further mobility limited by pt missing LLE prosthetic socks/shrinker (hopeful family can bring when she gets a hold of them). Pt would benefit from continued acute PT services to maximize functional mobility and independence prior to d/c with HHPT services.     Follow Up Recommendations Home health PT;Supervision for mobility/OOB    Equipment Recommendations  3in1 (PT)    Recommendations for Other Services       Precautions / Restrictions Precautions Precautions: Fall Precaution Comments: Chronic L BKA (prosthetic in room, but no shrinker/socks) Restrictions Weight Bearing Restrictions: No      Mobility  Bed Mobility Overal bed mobility: Needs Assistance Bed Mobility: Supine to Sit;Sit to Supine     Supine to sit: Modified independent (Device/Increase time);HOB elevated Sit to supine: Modified independent (Device/Increase time)      Transfers                 General transfer comment: Declined attempting to stand/transfer without being able to wear LLE prosthetic, despite having +2 assist available  Ambulation/Gait                Stairs            Wheelchair Mobility    Modified Rankin (Stroke Patients Only)       Balance Overall balance assessment: Needs assistance Sitting-balance support: No upper extremity supported Sitting  balance-Leahy Scale: Good                                       Pertinent Vitals/Pain Pain Assessment: No/denies pain    Home Living Family/patient expects to be discharged to:: Private residence Living Arrangements: Other relatives;Children Available Help at Discharge: Family;Available PRN/intermittently Type of Home: House Home Access: Stairs to enter Entrance Stairs-Rails: Right Entrance Stairs-Number of Steps: 5 Home Layout: One level Home Equipment: Tub bench;Walker - 2 wheels;Wheelchair - manual Additional Comments: BSC recently broke, needs new one. Reports lives in sister's house which has rats, so pt working on moving    Prior Function Level of Independence: Needs assistance   Gait / Transfers Assistance Needed: Short, household distances and steps into home with RW and LLE prosthesis; primarily uses w/c for mobility. Sits on tub bench for "the occasional shower."  ADL's / Homemaking Assistance Needed: Assist for household tasks; uses reacher for washing backside  Comments: Not on home O2. Sedentary, enjoys watching tv     Hand Dominance   Dominant Hand: Right    Extremity/Trunk Assessment   Upper Extremity Assessment Upper Extremity Assessment: Overall WFL for tasks assessed    Lower Extremity Assessment Lower Extremity Assessment: LLE deficits/detail LLE Deficits / Details: chronic L BKA; hip flex at least 3/5       Communication   Communication: No difficulties  Cognition Arousal/Alertness: Awake/alert   Overall Cognitive Status: Within Functional  Limits for tasks assessed                                        General Comments General comments (skin integrity, edema, etc.): SpO2 96-98% on RA    Exercises     Assessment/Plan    PT Assessment Patient needs continued PT services  PT Problem List Decreased activity tolerance;Decreased balance;Decreased mobility;Cardiopulmonary status limiting activity       PT  Treatment Interventions DME instruction;Gait training;Functional mobility training;Therapeutic activities;Stair training;Therapeutic exercise;Balance training;Patient/family education    PT Goals (Current goals can be found in the Care Plan section)  Acute Rehab PT Goals Patient Stated Goal: Return home PT Goal Formulation: With patient Time For Goal Achievement: 11/02/19 Potential to Achieve Goals: Good    Frequency Min 3X/week   Barriers to discharge        Co-evaluation PT/OT/SLP Co-Evaluation/Treatment: Yes Reason for Co-Treatment: For patient/therapist safety;To address functional/ADL transfers PT goals addressed during session: Mobility/safety with mobility;Balance         AM-PAC PT "6 Clicks" Mobility  Outcome Measure Help needed turning from your back to your side while in a flat bed without using bedrails?: None Help needed moving from lying on your back to sitting on the side of a flat bed without using bedrails?: A Little Help needed moving to and from a bed to a chair (including a wheelchair)?: A Little Help needed standing up from a chair using your arms (e.g., wheelchair or bedside chair)?: A Little Help needed to walk in hospital room?: A Little Help needed climbing 3-5 steps with a railing? : A Lot 6 Click Score: 18    End of Session   Activity Tolerance: Patient tolerated treatment well Patient left: in bed;with call bell/phone within reach Nurse Communication: Mobility status PT Visit Diagnosis: Other abnormalities of gait and mobility (R26.89)    Time: [id number] PT Time Calculation (min) (ACUTE ONLY): 36 min   Charges:   PT Evaluation $PT Eval Moderate Complexity: McIntosh, PT, DPT Acute Rehabilitation Services  Pager [phone] Office [place] 10/19/2019, 12:07 PM

## 2019-10-19 NOTE — Progress Notes (Signed)
Occupational Therapy Evaluation Patient Details Name: Jennifer [name] MRN: [id number] DOB: Jun 22, 1961 Today's Date: 10/19/2019    History of Present Illness Pt is a 58 y.o. female admitted 10/16/19 with COPD exacerbation likely secondary to bacterial PNA. 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   Clinical Impression   PTA, pt was living at home with her son, who assists pt with ADL/IADL prn and pt reports she was modified independent with RW. Pt currently requires minA for LB ADL and setupA -minguard for grooming while sitting EOB. Pt on RA, SpO2 91%-98%, educated pt on pursed lip breathing;pt with productive cough. Pt currently limited this session secondary to pt missing LLE prosthetic socks/shrinker, family does not have a car to bring her shrinker. Due to decline in current level of function, pt would benefit from acute OT to address established goals to facilitate safe D/C to venue listed below. At this time, recommend HHOT follow-up. Will continue to follow acutely.     Follow Up Recommendations  Home health OT;Supervision - Intermittent    Equipment Recommendations  3 in 1 bedside commode    Recommendations for Other Services       Precautions / Restrictions Precautions Precautions: Fall Precaution Comments: Chronic L BKA (prosthetic in room, but no shrinker/socks) Restrictions Weight Bearing Restrictions: No      Mobility Bed Mobility Overal bed mobility: Needs Assistance Bed Mobility: Supine to Sit;Sit to Supine     Supine to sit: Modified independent (Device/Increase time);HOB elevated Sit to supine: Modified independent (Device/Increase time)      Transfers                 General transfer comment: Declined attempting to stand/transfer without being able to wear LLE prosthetic, despite having +2 assist available    Balance Overall balance assessment: Needs assistance Sitting-balance support: No upper extremity supported Sitting  balance-Leahy Scale: Good                                     ADL either performed or assessed with clinical judgement   ADL Overall ADL's : Needs assistance/impaired Eating/Feeding: Set up;Sitting   Grooming: Set up;Sitting   Upper Body Bathing: Set up;Sitting Upper Body Bathing Details (indicate cue type and reason): completed sitting EOB Lower Body Bathing: Minimal assistance;Sitting/lateral leans Lower Body Bathing Details (indicate cue type and reason): assistance to don lotion on RLE due to decreased actitivty tolerance Upper Body Dressing : Set up;Sitting   Lower Body Dressing: Minimal assistance;Sitting/lateral leans Lower Body Dressing Details (indicate cue type and reason): pt able to reach foot to attempt to doff sock, requires assistance to doff/don over foot   Toilet Transfer Details (indicate cue type and reason): did not attempt           General ADL Comments: deferred functional mobility at this time as pt does not have liner for prosthetic;pt completed bathing while sitting EOB; SpO2 91%-97% throughout session, pt able to increase O2 with pursed lip breathing     Vision         Perception     Praxis      Pertinent Vitals/Pain Pain Assessment: No/denies pain     Hand Dominance Right   Extremity/Trunk Assessment Upper Extremity Assessment Upper Extremity Assessment: Overall WFL for tasks assessed   Lower Extremity Assessment Lower Extremity Assessment: Defer to PT evaluation LLE Deficits / Details: chronic L BKA;  hip flex at least 3/5       Communication Communication Communication: No difficulties   Cognition Arousal/Alertness: Awake/alert Behavior During Therapy: WFL for tasks assessed/performed Overall Cognitive Status: Within Functional Limits for tasks assessed                                     General Comments  Pt with productive cough following ADL    Exercises     Shoulder Instructions       Home Living Family/patient expects to be discharged to:: Private residence Living Arrangements: Other relatives;Children Available Help at Discharge: Family;Available PRN/intermittently Type of Home: House Home Access: Stairs to enter [employer] of Steps: 5 Entrance Stairs-Rails: Right Home Layout: One level     Bathroom Shower/Tub: Teacher, early years/pre: Standard     Home Equipment: Tub bench;Walker - 2 wheels;Wheelchair - manual   Additional Comments: BSC recently broke, needs new one. Reports lives in sister's house which has rats, so pt working on moving      Prior Functioning/Environment Level of Independence: Needs assistance  Gait / Transfers Assistance Needed: Short, household distances and steps into home with RW and LLE prosthesis; primarily uses w/c for mobility. Sits on tub bench for "the occasional shower." ADL's / Homemaking Assistance Needed: Assist for household tasks; uses reacher for washing backside   Comments: Not on home O2. Sedentary, enjoys watching tv        OT Problem List: Decreased activity tolerance;Impaired balance (sitting and/or standing);Decreased safety awareness;Decreased knowledge of precautions;Cardiopulmonary status limiting activity      OT Treatment/Interventions: Self-care/ADL training;Therapeutic exercise;Energy conservation;DME and/or AE instruction;Therapeutic activities;Patient/family education;Balance training    OT Goals(Current goals can be found in the care plan section) Acute Rehab OT Goals Patient Stated Goal: Return home OT Goal Formulation: With patient Time For Goal Achievement: 11/02/19 Potential to Achieve Goals: Good ADL Goals Pt Will Perform Grooming: with modified independence;sitting;standing Pt Will Perform Upper Body Dressing: with modified independence;sitting;standing Pt Will Perform Lower Body Dressing: with modified independence;sit to/from stand Pt Will Transfer to Toilet: with  modified independence;ambulating Pt Will Perform Toileting - Clothing Manipulation and hygiene: with modified independence;sit to/from stand Additional ADL Goal #1: Pt will demonstrate independence with 3 energy conservation strategies during ADL completion.  OT Frequency: Min 2X/week   Barriers to D/C: Inaccessible home environment  pt has stairs with poor rail       Co-evaluation PT/OT/SLP Co-Evaluation/Treatment: Yes Reason for Co-Treatment: For patient/therapist safety;To address functional/ADL transfers   OT goals addressed during session: ADL's and self-care      AM-PAC OT "6 Clicks" Daily Activity     Outcome Measure Help from another person eating meals?: A Little Help from another person taking care of personal grooming?: A Little Help from another person toileting, which includes using toliet, bedpan, or urinal?: A Lot Help from another person bathing (including washing, rinsing, drying)?: A Little Help from another person to put on and taking off regular upper body clothing?: A Little Help from another person to put on and taking off regular lower body clothing?: A Little 6 Click Score: 17   End of Session Nurse Communication: Mobility status  Activity Tolerance: Patient tolerated treatment well Patient left: in bed;with call bell/phone within reach;with bed alarm set  OT Visit Diagnosis: Unsteadiness on feet (R26.81);Other abnormalities of gait and mobility (R26.89)  Time: [id number] OT Time Calculation (min): 39 min Charges:  OT General Charges $OT Visit: 1 Visit OT Evaluation $OT Eval Moderate Complexity: 1 Mod OT Treatments $Self Care/Home Management : 8-22 mins  Dorinda Hill OTR/L Acute Rehabilitation Services Office: [place] 10/19/2019, 12:32 PM

## 2019-10-19 NOTE — Telephone Encounter (Signed)
Pt calls and states she is going home and needs her oxy sent in done

## 2019-10-20 ENCOUNTER — Other Ambulatory Visit: Payer: Self-pay | Admitting: Internal Medicine

## 2019-10-20 DIAGNOSIS — Z89519 Acquired absence of unspecified leg below knee: Secondary | ICD-10-CM

## 2019-10-22 LAB — CULTURE, BLOOD (ROUTINE X 2)
Culture: NO GROWTH
Culture: NO GROWTH

## 2019-10-24 ENCOUNTER — Other Ambulatory Visit: Payer: Self-pay

## 2019-10-24 NOTE — Patient Outreach (Signed)
New referral: Dx: COPD MD office does transition of care  Placed call to patient with no answer. Left a message requesting a call back.  Tomasa Rand, RN, BSN, CEN [facility] [employer] [phone]

## 2019-10-27 ENCOUNTER — Other Ambulatory Visit: Payer: Self-pay

## 2019-10-27 NOTE — Patient Outreach (Signed)
Telephone assessment: Placed call to patient with no answer. This is the second attempt.  PLAN: will attempt 3rd outreach in 3 business days. Letter already mailed.  Tomasa Rand, RN, BSN, CEN [facility] [employer] [phone]

## 2019-10-30 ENCOUNTER — Other Ambulatory Visit: Payer: Self-pay | Admitting: Internal Medicine

## 2019-10-30 DIAGNOSIS — IMO0002 Reserved for concepts with insufficient information to code with codable children: Secondary | ICD-10-CM

## 2019-10-30 DIAGNOSIS — E1151 Type 2 diabetes mellitus with diabetic peripheral angiopathy without gangrene: Secondary | ICD-10-CM

## 2019-11-01 ENCOUNTER — Other Ambulatory Visit: Payer: Self-pay

## 2019-11-01 NOTE — Patient Outreach (Signed)
Telephone assessment:  Placed call to patient for 3rd outreach attempt. No answer Letter already mailed, if no response to letter will plan to close case on 11/07/2019  Tomasa Rand, RN, BSN, [place] Coordinator [phone]

## 2019-11-02 ENCOUNTER — Telehealth: Payer: Self-pay | Admitting: *Deleted

## 2019-11-02 ENCOUNTER — Other Ambulatory Visit: Payer: Self-pay | Admitting: Internal Medicine

## 2019-11-02 DIAGNOSIS — E1159 Type 2 diabetes mellitus with other circulatory complications: Secondary | ICD-10-CM

## 2019-11-02 NOTE — Telephone Encounter (Signed)
DME Needs rolling walker, she has bariatric walker but it will not fit thru the doorways in her home. This is per [facility] PT. Send script to [facility] Pt has appt 11/12

## 2019-11-02 NOTE — Telephone Encounter (Addendum)
The need for this can be discussed at tomorrow's appt. If she received the bariatric rolling walkerr within the last 5 years a new one will not be covered by insurance. [name], BSN, RN-BC

## 2019-11-03 ENCOUNTER — Encounter: Payer: Self-pay | Admitting: Dietician

## 2019-11-03 ENCOUNTER — Ambulatory Visit (INDEPENDENT_AMBULATORY_CARE_PROVIDER_SITE_OTHER): Payer: Medicare Other | Admitting: Internal Medicine

## 2019-11-03 ENCOUNTER — Encounter: Payer: Self-pay | Admitting: Internal Medicine

## 2019-11-03 ENCOUNTER — Ambulatory Visit: Payer: Medicare Other | Admitting: Dietician

## 2019-11-03 VITALS — BP 138/84 | HR 79 | Temp 98.4°F | Wt 253.8 lb

## 2019-11-03 DIAGNOSIS — E1159 Type 2 diabetes mellitus with other circulatory complications: Secondary | ICD-10-CM | POA: Diagnosis not present

## 2019-11-03 DIAGNOSIS — I110 Hypertensive heart disease with heart failure: Secondary | ICD-10-CM | POA: Diagnosis not present

## 2019-11-03 DIAGNOSIS — R443 Hallucinations, unspecified: Secondary | ICD-10-CM

## 2019-11-03 DIAGNOSIS — R269 Unspecified abnormalities of gait and mobility: Secondary | ICD-10-CM

## 2019-11-03 DIAGNOSIS — Z794 Long term (current) use of insulin: Secondary | ICD-10-CM

## 2019-11-03 DIAGNOSIS — R519 Headache, unspecified: Secondary | ICD-10-CM

## 2019-11-03 DIAGNOSIS — I700 Atherosclerosis of aorta: Secondary | ICD-10-CM

## 2019-11-03 DIAGNOSIS — Z0000 Encounter for general adult medical examination without abnormal findings: Secondary | ICD-10-CM

## 2019-11-03 DIAGNOSIS — IMO0002 Reserved for concepts with insufficient information to code with codable children: Secondary | ICD-10-CM

## 2019-11-03 DIAGNOSIS — E08319 Diabetes mellitus due to underlying condition with unspecified diabetic retinopathy without macular edema: Secondary | ICD-10-CM

## 2019-11-03 DIAGNOSIS — R251 Tremor, unspecified: Secondary | ICD-10-CM

## 2019-11-03 DIAGNOSIS — Z598 Other problems related to housing and economic circumstances: Secondary | ICD-10-CM

## 2019-11-03 DIAGNOSIS — G4733 Obstructive sleep apnea (adult) (pediatric): Secondary | ICD-10-CM

## 2019-11-03 DIAGNOSIS — Z79899 Other long term (current) drug therapy: Secondary | ICD-10-CM

## 2019-11-03 DIAGNOSIS — Z89512 Acquired absence of left leg below knee: Secondary | ICD-10-CM

## 2019-11-03 DIAGNOSIS — E11319 Type 2 diabetes mellitus with unspecified diabetic retinopathy without macular edema: Secondary | ICD-10-CM | POA: Diagnosis not present

## 2019-11-03 DIAGNOSIS — I5032 Chronic diastolic (congestive) heart failure: Secondary | ICD-10-CM

## 2019-11-03 DIAGNOSIS — E1151 Type 2 diabetes mellitus with diabetic peripheral angiopathy without gangrene: Secondary | ICD-10-CM | POA: Diagnosis not present

## 2019-11-03 DIAGNOSIS — M542 Cervicalgia: Secondary | ICD-10-CM

## 2019-11-03 DIAGNOSIS — Z720 Tobacco use: Secondary | ICD-10-CM

## 2019-11-03 DIAGNOSIS — Q181 Preauricular sinus and cyst: Secondary | ICD-10-CM

## 2019-11-03 DIAGNOSIS — H9202 Otalgia, left ear: Secondary | ICD-10-CM

## 2019-11-03 MED ORDER — HYDROXYZINE PAMOATE 25 MG PO CAPS: 50.0000 mg | ORAL_CAPSULE | Freq: Every day | ORAL | 0 refills | Status: DC

## 2019-11-03 NOTE — Telephone Encounter (Signed)
Please phone in [place].  HCTZ was stopped in 04/2019 discharge due to normal blood pressure and an AKI.  Blood pressure is now consistently elevated so will resume it as it will help in addition to her other antihypertensives.

## 2019-11-03 NOTE — Assessment & Plan Note (Signed)
This problem is chronic and stable.  Was an incidental finding on a CT in 2007.  She continues to smoke but is not yet ready to quit.  She is on rosuvastatin 20, a high-intensity statin.  We are working on controlling her diabetes.  PLAN : Cont meds

## 2019-11-03 NOTE — Assessment & Plan Note (Signed)
This problem is chronic and uncontrolled.  She is on semaglutide 0.5 weekly, Tresiba 70 units daily, Metformin XR 500 every morning.  She met with [name], our diabetes educator this morning, and they added Humalog 3 times daily.  They set up her phone to alarm 3 times a day before meals and if her sugar is greater than 176, she is supposed to take Humaog.  She has a follow-up appointment with [name] on the 24th.  She is interested in eventually getting her personal CGM.  Her next A1c is due at the end of November and hopefully, she can get it when she needs it done.  PLAN:  Cont current meds Follow-up with [name] later this month

## 2019-11-03 NOTE — Patient Instructions (Signed)
1. Please see me in January 2021 2. I will get Dr Jess Barters notes 3. I will order a CPAP mask and machine 4. I will order a walker 5. I will fill the lyrica and atarax

## 2019-11-03 NOTE — Assessment & Plan Note (Addendum)
She is concerned about Parkinson's disease.  She states her mother died of Parkinson's disease.  She states her hands shake intermittently and that her fingers jumped when she tries to gain numbers on her cell phone.  She states she also has hallucinations but this only occurs as she is falling asleep or waking up from sleep.  She endorsed she had no cogwheel rigidity on examination today.  We will continue to follow her symptoms.  She requested a refill of her Atarax today which I have been denying as it had been stopped when she was admitted for acute encephalopathy.  She states that there are bedbugs which bite her and itch and that she takes 2 Atarax in the morning which helps a lot.  I refilled it.

## 2019-11-03 NOTE — Telephone Encounter (Signed)
Pregabalin called to [place] at [employer]. [name], BSN, RN-BC

## 2019-11-03 NOTE — Assessment & Plan Note (Signed)
See documentation under diabetes

## 2019-11-03 NOTE — Assessment & Plan Note (Signed)
This problem is chronic and uncontrolled.  She was diagnosed with moderate OSA in June 2018.  She does not have the equipment and is requesting that it be prescribed today as she feels she has too many to pay for it.  PLAN : CPAP mask and machine

## 2019-11-03 NOTE — Progress Notes (Signed)
Diabetes Self-Management Education  Visit Type: Follow-up  Appt. Start Time: 0940 Appt. End Time: 1025  11/03/2019  Jennifer [name], identified by name and date of birth, is a 58 y.o. female with a diagnosis of Diabetes:  .   ASSESSMENT  There were no vitals taken for this visit. There is no height or weight on file to calculate BMI.  Diabetes Self-Management Education - 11/03/19 1100      Visit Information   Visit Type  Follow-up      Pre-Education Assessment   Patient understands using medications safely.  Needs Review      Patient Education   Medications  Reviewed patients medication for diabetes, action, purpose, timing of dose and side effects.    Monitoring  Purpose and frequency of SMBG.   set phone alarms     Individualized Goals (developed by patient)   Medications  take my medication as prescribed    Monitoring   test my blood glucose as discussed      Patient Self-Evaluation of Goals - Patient rates self as meeting previously set goals (% of time)   Medications  25 - 50%      Outcomes   Expected Outcomes  Demonstrated interest in learning. Expect positive outcomes    Future DMSE  2 wks    Program Status  Not Completed      Subsequent Visit   Since your last visit have you continued or begun to take your medications as prescribed?  No   tresiba and ozenpic regulalrly, but Humalog once a day   Since your last visit, are you checking your blood glucose at least once a day?  Yes   28 times in 30 days      Individualized Plan for Diabetes Self-Management Training:   Learning Objective:  Patient will have a greater understanding of diabetes self-management. Patient education plan is to attend individual and/or group sessions per assessed needs and concerns.   Plan:   Patient Instructions  Start using the Humalog Kwikpen as soon as you get them.   Check your blood sugar 3x/day.   If blood sugar is < 175 Do not take any correction insulin.  176-225  inject 4 units.  226-275 inject 6 units.  [id number] inject 8 units.  326-375 inject 10 units.  376-425 inject 12 units.  426-475 inject 14 units.  [id number] inject 16 units and call office for further instructions.  Jennifer [name] [phone]    Expected Outcomes:  Demonstrated interest in learning. Expect positive outcomes  Education material provided:  After visit summary  If problems or questions, patient to contact team via:  Phone  Future DSME appointment: 2 wks  Jennifer [name], RD 11/03/2019 12:01 PM.

## 2019-11-03 NOTE — Assessment & Plan Note (Signed)
This problem is chronic and uncontrolled.  She is on Norvasc 10 mg, benazepril 40, atenolol 100.  Her HCTZ had been stopped in May but today, she states that she has been taking it since September.  She also uses furosemide PRN but not every day.  Her blood pressure is not at goal.  She wants to have surgery on both her right rotator cuff and on the right preauricular cyst.  I am going to try to get her medical conditions under better control.  Rather than adding a 5th medication, I am going to move her benazepril to bedtime and see if that helps to provide better control.  If that does not work, I would consider spironolactone.  PLAN : Continue current medications but move benazepril to bedtime   BP Readings from Last 3 Encounters:  11/03/19 138/84  10/19/19 (!) 154/66  08/24/19 (!) 163/68

## 2019-11-03 NOTE — Assessment & Plan Note (Signed)
Is chronic and stable.  She is currently euvolemic. Plan : Follow

## 2019-11-03 NOTE — Assessment & Plan Note (Signed)
She request a rolling walker today.  Her current bariatric walker will not fit the Jennifer [name] of her current living situation.  She requires a walker as she has a left BKA and she has a prosthesis but it is not fitting well..  She uses a walker to prevent falls.

## 2019-11-03 NOTE — Telephone Encounter (Signed)
Community message sent to Jolee Ewing at [facility] that order is in for rolling walker. F2F was today. [name], BSN, RN-BC

## 2019-11-03 NOTE — Progress Notes (Signed)
   Subjective:    [name], female    DOB: 1961-05-03, 58 y.o.   MRN: [id number]  HPI  BRENLYNN BIN is here for DM F/U. Please see the A&P for the status of the pt's chronic medical problems.  ROS : per ROS section and in problem oriented charting. All other systems are negative.  PMHx, Soc hx, and / or Fam hx : She continues to live with her niece.  She sleeps on the left side in the living room.  There are bedbugs in the domicile.  Review of Systems  HENT: Positive for ear pain.        No dysphagia  Respiratory: Positive for cough. Negative for shortness of breath.   Musculoskeletal: Positive for arthralgias, gait problem and neck pain.  Skin:       Positive itching  Neurological: Positive for tremors and headaches.  Psychiatric/Behavioral: Positive for hallucinations.       Objective:   Physical Exam Constitutional:      General: She is not in acute distress.    Appearance: Normal appearance. She is obese. She is not ill-appearing, toxic-appearing or diaphoretic.  HENT:     Head: Normocephalic and atraumatic.     Right Ear: External ear normal.     Left Ear: External ear normal.     Nose: Nose normal.     Mouth/Throat:     Mouth: Mucous membranes are moist.  Eyes:     Extraocular Movements: Extraocular movements intact.     Conjunctiva/sclera: Conjunctivae normal.  Skin:    General: Skin is warm and dry.  Neurological:     General: No focal deficit present.     Mental Status: She is alert. Mental status is at baseline.     Comments: No cogwheel rigidity, no baseline tremor  Psychiatric:        Mood and Affect: Mood normal.        Behavior: Behavior normal.        Thought Content: Thought content normal.        Judgment: Judgment normal.           Assessment & Plan:

## 2019-11-03 NOTE — Patient Instructions (Addendum)
Start using the [place] as soon as you get them.   Check your blood sugar 3x/day.   If blood sugar is < 175 Do not take any correction insulin.  176-225 inject 4 units.  226-275 inject 6 units.  [id number] inject 8 units.  326-375 inject 10 units.  376-425 inject 12 units.  426-475 inject 14 units.  [id number] inject 16 units and call office for further instructions.  [name] [phone]

## 2019-11-03 NOTE — Assessment & Plan Note (Signed)
We again discussed that it is a Psychologist, sport and exercise who decides whether to proceed with surgery or not.  We discussed that we would work on getting her medical issues under control to make her a better surgical candidate.  She also wants to get surgery on her right rotator cuff.  It was repaired in 2005 and her left was repaired in 2012.  She states she has been unable to schedule the surgery with Dr. Sharol Given despite calling his office.  I will try to get his records.

## 2019-11-07 ENCOUNTER — Ambulatory Visit (INDEPENDENT_AMBULATORY_CARE_PROVIDER_SITE_OTHER): Payer: Medicare Other | Admitting: Otolaryngology

## 2019-11-07 ENCOUNTER — Ambulatory Visit: Payer: Self-pay

## 2019-11-07 ENCOUNTER — Other Ambulatory Visit: Payer: Self-pay | Admitting: Internal Medicine

## 2019-11-07 ENCOUNTER — Other Ambulatory Visit: Payer: Self-pay

## 2019-11-07 ENCOUNTER — Other Ambulatory Visit: Payer: Self-pay | Admitting: Physician Assistant

## 2019-11-07 DIAGNOSIS — G4733 Obstructive sleep apnea (adult) (pediatric): Secondary | ICD-10-CM

## 2019-11-07 NOTE — Patient Outreach (Signed)
Case closure:  Unsuccessful telephone outreaches x3, no response to outreach letter.  PLAN: close case as unable to reach. Will send letter MD.  Tomasa Rand, RN, BSN, CEN [place] Coordinator [phone]

## 2019-11-08 ENCOUNTER — Other Ambulatory Visit (HOSPITAL_COMMUNITY)
Admission: RE | Admit: 2019-11-08 | Discharge: 2019-11-08 | Disposition: A | Discharge status: Home / Self Care | Payer: Medicare Other | Source: Ambulatory Visit | Attending: Orthopedic Surgery | Admitting: Orthopedic Surgery

## 2019-11-08 DIAGNOSIS — Z01812 Encounter for preprocedural laboratory examination: Secondary | ICD-10-CM | POA: Insufficient documentation

## 2019-11-08 DIAGNOSIS — Z20828 Contact with and (suspected) exposure to other viral communicable diseases: Secondary | ICD-10-CM | POA: Diagnosis not present

## 2019-11-08 NOTE — Telephone Encounter (Signed)
Wilhelmena Zea, Orvis Brill, RN  [place], [place], [place]; [place], [place], [place]; [place], [place]phani Police  Cc: Marcelino Duster, CMA        Yes, thank you!   Previous Messages  ----- Message -----  From: Mariann Barter  Sent: 11/03/2019  3:49 PM EST  To: Darlina Guys, Elon Alas, *  Subject: RE: Rolling walker                got it thank you. I see there is also an order for a cpap in there. Is that one for [place] as well?

## 2019-11-09 ENCOUNTER — Other Ambulatory Visit: Payer: Self-pay | Admitting: Internal Medicine

## 2019-11-09 ENCOUNTER — Telehealth: Payer: Self-pay | Admitting: Dietician

## 2019-11-09 LAB — NOVEL CORONAVIRUS, NAA (HOSP ORDER, SEND-OUT TO REF LAB; TAT 18-24 HRS): SARS-CoV-2, NAA: NOT DETECTED

## 2019-11-09 MED ORDER — BENAZEPRIL HCL 40 MG PO TABS: 40.0000 mg | ORAL_TABLET | Freq: Every day | ORAL | 3 refills | Status: DC

## 2019-11-09 NOTE — Progress Notes (Signed)
Sent to [facility]

## 2019-11-09 NOTE — Telephone Encounter (Signed)
Jennifer [name] wanted t notify us that she s having surgery on Friday for her rotator cuff.  She anticipates she will stay for a few days.

## 2019-11-09 NOTE — Telephone Encounter (Signed)
OK thank you 

## 2019-11-10 ENCOUNTER — Other Ambulatory Visit: Payer: Self-pay

## 2019-11-10 DIAGNOSIS — J45909 Unspecified asthma, uncomplicated: Secondary | ICD-10-CM | POA: Diagnosis not present

## 2019-11-10 DIAGNOSIS — F329 Major depressive disorder, single episode, unspecified: Secondary | ICD-10-CM | POA: Diagnosis not present

## 2019-11-10 DIAGNOSIS — Z7982 Long term (current) use of aspirin: Secondary | ICD-10-CM | POA: Diagnosis not present

## 2019-11-10 DIAGNOSIS — X58XXXA Exposure to other specified factors, initial encounter: Secondary | ICD-10-CM | POA: Diagnosis not present

## 2019-11-10 DIAGNOSIS — J449 Chronic obstructive pulmonary disease, unspecified: Secondary | ICD-10-CM | POA: Diagnosis not present

## 2019-11-10 DIAGNOSIS — M75121 Complete rotator cuff tear or rupture of right shoulder, not specified as traumatic: Secondary | ICD-10-CM | POA: Diagnosis not present

## 2019-11-10 DIAGNOSIS — E1142 Type 2 diabetes mellitus with diabetic polyneuropathy: Secondary | ICD-10-CM | POA: Diagnosis not present

## 2019-11-10 DIAGNOSIS — G894 Chronic pain syndrome: Secondary | ICD-10-CM | POA: Diagnosis not present

## 2019-11-10 DIAGNOSIS — I5032 Chronic diastolic (congestive) heart failure: Secondary | ICD-10-CM | POA: Diagnosis not present

## 2019-11-10 DIAGNOSIS — M19011 Primary osteoarthritis, right shoulder: Secondary | ICD-10-CM | POA: Diagnosis not present

## 2019-11-10 DIAGNOSIS — S43431A Superior glenoid labrum lesion of right shoulder, initial encounter: Secondary | ICD-10-CM | POA: Diagnosis not present

## 2019-11-10 DIAGNOSIS — K219 Gastro-esophageal reflux disease without esophagitis: Secondary | ICD-10-CM | POA: Diagnosis not present

## 2019-11-10 DIAGNOSIS — M65811 Other synovitis and tenosynovitis, right shoulder: Secondary | ICD-10-CM | POA: Diagnosis not present

## 2019-11-10 DIAGNOSIS — K760 Fatty (change of) liver, not elsewhere classified: Secondary | ICD-10-CM | POA: Diagnosis not present

## 2019-11-10 DIAGNOSIS — H409 Unspecified glaucoma: Secondary | ICD-10-CM | POA: Diagnosis not present

## 2019-11-10 DIAGNOSIS — Z79899 Other long term (current) drug therapy: Secondary | ICD-10-CM | POA: Diagnosis not present

## 2019-11-10 DIAGNOSIS — Z794 Long term (current) use of insulin: Secondary | ICD-10-CM | POA: Diagnosis not present

## 2019-11-10 DIAGNOSIS — M069 Rheumatoid arthritis, unspecified: Secondary | ICD-10-CM | POA: Diagnosis not present

## 2019-11-10 DIAGNOSIS — E785 Hyperlipidemia, unspecified: Secondary | ICD-10-CM | POA: Diagnosis not present

## 2019-11-10 DIAGNOSIS — M75101 Unspecified rotator cuff tear or rupture of right shoulder, not specified as traumatic: Secondary | ICD-10-CM | POA: Diagnosis present

## 2019-11-10 DIAGNOSIS — M797 Fibromyalgia: Secondary | ICD-10-CM | POA: Diagnosis not present

## 2019-11-10 DIAGNOSIS — E1151 Type 2 diabetes mellitus with diabetic peripheral angiopathy without gangrene: Secondary | ICD-10-CM | POA: Diagnosis not present

## 2019-11-10 DIAGNOSIS — M719 Bursopathy, unspecified: Secondary | ICD-10-CM | POA: Diagnosis not present

## 2019-11-10 DIAGNOSIS — Z20828 Contact with and (suspected) exposure to other viral communicable diseases: Secondary | ICD-10-CM | POA: Diagnosis not present

## 2019-11-10 DIAGNOSIS — I110 Hypertensive heart disease with heart failure: Secondary | ICD-10-CM | POA: Diagnosis not present

## 2019-11-10 DIAGNOSIS — Z7902 Long term (current) use of antithrombotics/antiplatelets: Secondary | ICD-10-CM | POA: Diagnosis not present

## 2019-11-10 NOTE — Progress Notes (Signed)
Ms Crapps denies chest pain or shortness of breath. Patient was tested for Covid and has been in quarantine since that time.  Mrs Juhasz is a type II. Patient reports that CBG's have been running 139 - 200's.  I instructed patient if CBG > 70 in am to take 1/2 of sliding scale Insulin. Dont take Metformin in am.I instructed patient to check CBG after awaking and every 2 hours until arrival  to the hospital.  I Instructed patient if CBG is less than 70 to drink   1/2 cup of a clear juice. Recheck CBG in 15 minutes then call pre- op desk at [phone] for further instructions. If scheduled to receive Insulin, do not take Insulin

## 2019-11-11 ENCOUNTER — Other Ambulatory Visit: Payer: Self-pay

## 2019-11-11 ENCOUNTER — Ambulatory Visit (HOSPITAL_COMMUNITY): Payer: Medicare Other | Admitting: Certified Registered Nurse Anesthetist

## 2019-11-11 ENCOUNTER — Encounter (HOSPITAL_COMMUNITY)
Admission: RE | Disposition: A | Discharge status: Skilled Nursing Facility | Payer: Self-pay | Source: Home / Self Care | Attending: Orthopedic Surgery

## 2019-11-11 ENCOUNTER — Encounter (HOSPITAL_COMMUNITY): Payer: Self-pay

## 2019-11-11 ENCOUNTER — Observation Stay (HOSPITAL_COMMUNITY)
Admission: RE | Admit: 2019-11-11 | Discharge: 2019-11-16 | Disposition: A | Discharge status: Skilled Nursing Facility | Payer: Medicare Other | Attending: Orthopedic Surgery | Admitting: Orthopedic Surgery

## 2019-11-11 DIAGNOSIS — M65811 Other synovitis and tenosynovitis, right shoulder: Secondary | ICD-10-CM | POA: Insufficient documentation

## 2019-11-11 DIAGNOSIS — J449 Chronic obstructive pulmonary disease, unspecified: Secondary | ICD-10-CM | POA: Insufficient documentation

## 2019-11-11 DIAGNOSIS — M75121 Complete rotator cuff tear or rupture of right shoulder, not specified as traumatic: Principal | ICD-10-CM | POA: Insufficient documentation

## 2019-11-11 DIAGNOSIS — Z6839 Body mass index (BMI) 39.0-39.9, adult: Secondary | ICD-10-CM | POA: Insufficient documentation

## 2019-11-11 DIAGNOSIS — G4730 Sleep apnea, unspecified: Secondary | ICD-10-CM | POA: Insufficient documentation

## 2019-11-11 DIAGNOSIS — E669 Obesity, unspecified: Secondary | ICD-10-CM | POA: Insufficient documentation

## 2019-11-11 DIAGNOSIS — S43431A Superior glenoid labrum lesion of right shoulder, initial encounter: Secondary | ICD-10-CM | POA: Insufficient documentation

## 2019-11-11 DIAGNOSIS — M069 Rheumatoid arthritis, unspecified: Secondary | ICD-10-CM | POA: Insufficient documentation

## 2019-11-11 DIAGNOSIS — Z833 Family history of diabetes mellitus: Secondary | ICD-10-CM | POA: Insufficient documentation

## 2019-11-11 DIAGNOSIS — Z89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): Secondary | ICD-10-CM | POA: Insufficient documentation

## 2019-11-11 DIAGNOSIS — Z90710 Acquired absence of both cervix and uterus: Secondary | ICD-10-CM | POA: Insufficient documentation

## 2019-11-11 DIAGNOSIS — M75111 Incomplete rotator cuff tear or rupture of right shoulder, not specified as traumatic: Secondary | ICD-10-CM

## 2019-11-11 DIAGNOSIS — I110 Hypertensive heart disease with heart failure: Secondary | ICD-10-CM | POA: Insufficient documentation

## 2019-11-11 DIAGNOSIS — E785 Hyperlipidemia, unspecified: Secondary | ICD-10-CM | POA: Insufficient documentation

## 2019-11-11 DIAGNOSIS — Z885 Allergy status to narcotic agent status: Secondary | ICD-10-CM | POA: Insufficient documentation

## 2019-11-11 DIAGNOSIS — E1142 Type 2 diabetes mellitus with diabetic polyneuropathy: Secondary | ICD-10-CM | POA: Insufficient documentation

## 2019-11-11 DIAGNOSIS — Z89422 Acquired absence of other left toe(s): Secondary | ICD-10-CM | POA: Insufficient documentation

## 2019-11-11 DIAGNOSIS — K760 Fatty (change of) liver, not elsewhere classified: Secondary | ICD-10-CM | POA: Insufficient documentation

## 2019-11-11 DIAGNOSIS — J45909 Unspecified asthma, uncomplicated: Secondary | ICD-10-CM | POA: Insufficient documentation

## 2019-11-11 DIAGNOSIS — H409 Unspecified glaucoma: Secondary | ICD-10-CM | POA: Insufficient documentation

## 2019-11-11 DIAGNOSIS — Z89512 Acquired absence of left leg below knee: Secondary | ICD-10-CM | POA: Insufficient documentation

## 2019-11-11 DIAGNOSIS — I358 Other nonrheumatic aortic valve disorders: Secondary | ICD-10-CM | POA: Insufficient documentation

## 2019-11-11 DIAGNOSIS — M19011 Primary osteoarthritis, right shoulder: Secondary | ICD-10-CM | POA: Diagnosis not present

## 2019-11-11 DIAGNOSIS — Z8719 Personal history of other diseases of the digestive system: Secondary | ICD-10-CM | POA: Insufficient documentation

## 2019-11-11 DIAGNOSIS — Z888 Allergy status to other drugs, medicaments and biological substances status: Secondary | ICD-10-CM | POA: Insufficient documentation

## 2019-11-11 DIAGNOSIS — Z86718 Personal history of other venous thrombosis and embolism: Secondary | ICD-10-CM | POA: Insufficient documentation

## 2019-11-11 DIAGNOSIS — Z8249 Family history of ischemic heart disease and other diseases of the circulatory system: Secondary | ICD-10-CM | POA: Insufficient documentation

## 2019-11-11 DIAGNOSIS — Z20828 Contact with and (suspected) exposure to other viral communicable diseases: Secondary | ICD-10-CM | POA: Insufficient documentation

## 2019-11-11 DIAGNOSIS — M7541 Impingement syndrome of right shoulder: Secondary | ICD-10-CM | POA: Diagnosis not present

## 2019-11-11 DIAGNOSIS — Z91041 Radiographic dye allergy status: Secondary | ICD-10-CM | POA: Insufficient documentation

## 2019-11-11 DIAGNOSIS — M797 Fibromyalgia: Secondary | ICD-10-CM | POA: Insufficient documentation

## 2019-11-11 DIAGNOSIS — K219 Gastro-esophageal reflux disease without esophagitis: Secondary | ICD-10-CM | POA: Insufficient documentation

## 2019-11-11 DIAGNOSIS — Z7982 Long term (current) use of aspirin: Secondary | ICD-10-CM | POA: Insufficient documentation

## 2019-11-11 DIAGNOSIS — X58XXXA Exposure to other specified factors, initial encounter: Secondary | ICD-10-CM | POA: Insufficient documentation

## 2019-11-11 DIAGNOSIS — M719 Bursopathy, unspecified: Secondary | ICD-10-CM | POA: Insufficient documentation

## 2019-11-11 DIAGNOSIS — G894 Chronic pain syndrome: Secondary | ICD-10-CM | POA: Insufficient documentation

## 2019-11-11 DIAGNOSIS — F329 Major depressive disorder, single episode, unspecified: Secondary | ICD-10-CM | POA: Insufficient documentation

## 2019-11-11 DIAGNOSIS — Z794 Long term (current) use of insulin: Secondary | ICD-10-CM | POA: Insufficient documentation

## 2019-11-11 DIAGNOSIS — I5032 Chronic diastolic (congestive) heart failure: Secondary | ICD-10-CM | POA: Insufficient documentation

## 2019-11-11 DIAGNOSIS — M25511 Pain in right shoulder: Secondary | ICD-10-CM | POA: Diagnosis present

## 2019-11-11 DIAGNOSIS — E1151 Type 2 diabetes mellitus with diabetic peripheral angiopathy without gangrene: Secondary | ICD-10-CM | POA: Insufficient documentation

## 2019-11-11 DIAGNOSIS — Z8379 Family history of other diseases of the digestive system: Secondary | ICD-10-CM | POA: Insufficient documentation

## 2019-11-11 DIAGNOSIS — Z79899 Other long term (current) drug therapy: Secondary | ICD-10-CM | POA: Insufficient documentation

## 2019-11-11 DIAGNOSIS — Z7902 Long term (current) use of antithrombotics/antiplatelets: Secondary | ICD-10-CM | POA: Insufficient documentation

## 2019-11-11 DIAGNOSIS — G8918 Other acute postprocedural pain: Secondary | ICD-10-CM | POA: Diagnosis not present

## 2019-11-11 DIAGNOSIS — F17210 Nicotine dependence, cigarettes, uncomplicated: Secondary | ICD-10-CM | POA: Insufficient documentation

## 2019-11-11 HISTORY — PX: SHOULDER ARTHROSCOPY: SHX128

## 2019-11-11 LAB — BASIC METABOLIC PANEL
Anion gap: 12 (ref 5–15)
BUN: 12 mg/dL (ref 6–20)
CO2: 24 mmol/L (ref 22–32)
Calcium: 9.7 mg/dL (ref 8.9–10.3)
Chloride: 103 mmol/L (ref 98–111)
Creatinine, Ser: 0.88 mg/dL (ref 0.44–1.00)
GFR calc Af Amer: >60 mL/min (ref >60)
GFR calc non Af Amer: >60 mL/min (ref >60)
Glucose, Bld: 175 mg/dL — ABNORMAL HIGH (ref 70–99)
Potassium: 4.1 mmol/L (ref 3.5–5.1)
Sodium: 139 mmol/L (ref 135–145)

## 2019-11-11 LAB — CBC
HCT: 45.5 % (ref 36.0–46.0)
Hemoglobin: 14.1 g/dL (ref 12.0–15.0)
MCH: 30.3 pg (ref 26.0–34.0)
MCHC: 31 g/dL (ref 30.0–36.0)
MCV: 97.6 fL (ref 80.0–100.0)
Platelets: 148 10*3/uL — ABNORMAL LOW (ref 150–400)
RBC: 4.66 MIL/uL (ref 3.87–5.11)
RDW: 16 % — ABNORMAL HIGH (ref 11.5–15.5)
WBC: 9 10*3/uL (ref 4.0–10.5)
nRBC: 0 % (ref 0.0–0.2)

## 2019-11-11 LAB — GLUCOSE, CAPILLARY
Glucose-Capillary: 155 mg/dL — ABNORMAL HIGH (ref 70–99)
Glucose-Capillary: 156 mg/dL — ABNORMAL HIGH (ref 70–99)
Glucose-Capillary: 253 mg/dL — ABNORMAL HIGH (ref 70–99)
Glucose-Capillary: 360 mg/dL — ABNORMAL HIGH (ref 70–99)

## 2019-11-11 LAB — HEMOGLOBIN A1C
Hgb A1c MFr Bld: 9.3 % — ABNORMAL HIGH (ref 4.8–5.6)
Mean Plasma Glucose: 220.21 mg/dL

## 2019-11-11 SURGERY — ARTHROSCOPY, SHOULDER
Anesthesia: General | Site: Shoulder | Laterality: Right

## 2019-11-11 MED ORDER — PHENYLEPHRINE HCL-NACL 10-0.9 MG/250ML-% IV SOLN
INTRAVENOUS | Status: DC | PRN
  Administered 2019-11-11: 40 ug/min via INTRAVENOUS

## 2019-11-11 MED ORDER — FENTANYL CITRATE (PF) 100 MCG/2ML IJ SOLN
INTRAMUSCULAR | Status: AC
  Filled 2019-11-11: qty 2

## 2019-11-11 MED ORDER — MIDAZOLAM HCL 5 MG/5ML IJ SOLN
INTRAMUSCULAR | Status: DC | PRN
  Administered 2019-11-11: 2 mg via INTRAVENOUS

## 2019-11-11 MED ORDER — VITAMIN D 25 MCG (1000 UNIT) PO TABS
1000.0000 [IU] | ORAL_TABLET | Freq: Every day | ORAL | Status: DC
  Administered 2019-11-12 – 2019-11-16 (×5): 1000 [IU] via ORAL
  Filled 2019-11-11 (×5): qty 1

## 2019-11-11 MED ORDER — LIDOCAINE HCL (CARDIAC) PF 100 MG/5ML IV SOSY
PREFILLED_SYRINGE | INTRAVENOUS | Status: DC | PRN
  Administered 2019-11-11: 100 mg via INTRAVENOUS

## 2019-11-11 MED ORDER — ACETAMINOPHEN 325 MG PO TABS
325.0000 mg | ORAL_TABLET | Freq: Four times a day (QID) | ORAL | Status: DC | PRN
  Administered 2019-11-13 – 2019-11-16 (×5): 650 mg via ORAL
  Filled 2019-11-11 (×5): qty 2

## 2019-11-11 MED ORDER — FUROSEMIDE 40 MG PO TABS: 40.0000 mg | ORAL_TABLET | Freq: Every day | ORAL | Status: DC | PRN

## 2019-11-11 MED ORDER — BACLOFEN 5 MG HALF TABLET
10.0000 mg | ORAL_TABLET | Freq: Three times a day (TID) | ORAL | Status: DC | PRN
  Administered 2019-11-14: 10 mg via ORAL
  Filled 2019-11-11: qty 2

## 2019-11-11 MED ORDER — OXYCODONE-ACETAMINOPHEN 5-325 MG PO TABS: 1.0000 | ORAL_TABLET | ORAL | 0 refills | Status: DC | PRN

## 2019-11-11 MED ORDER — BENAZEPRIL HCL 20 MG PO TABS
40.0000 mg | ORAL_TABLET | Freq: Every day | ORAL | Status: DC
  Administered 2019-11-12 – 2019-11-16 (×5): 40 mg via ORAL
  Filled 2019-11-11 (×5): qty 2

## 2019-11-11 MED ORDER — FENTANYL CITRATE (PF) 100 MCG/2ML IJ SOLN
INTRAMUSCULAR | Status: DC | PRN
  Administered 2019-11-11 (×2): 50 ug via INTRAVENOUS

## 2019-11-11 MED ORDER — ATENOLOL 50 MG PO TABS
100.0000 mg | ORAL_TABLET | Freq: Every day | ORAL | Status: DC
  Administered 2019-11-12 – 2019-11-16 (×5): 100 mg via ORAL
  Filled 2019-11-11 (×5): qty 2

## 2019-11-11 MED ORDER — PREGABALIN 100 MG PO CAPS
200.0000 mg | ORAL_CAPSULE | Freq: Three times a day (TID) | ORAL | Status: DC
  Administered 2019-11-11 – 2019-11-16 (×15): 200 mg via ORAL
  Filled 2019-11-11 (×16): qty 2

## 2019-11-11 MED ORDER — SODIUM CHLORIDE 0.9 % IR SOLN
Status: DC | PRN
  Administered 2019-11-11 (×3): 3000 mL

## 2019-11-11 MED ORDER — METOCLOPRAMIDE HCL 5 MG/ML IJ SOLN: 5.0000 mg | Freq: Three times a day (TID) | INTRAMUSCULAR | Status: DC | PRN

## 2019-11-11 MED ORDER — HYDROCHLOROTHIAZIDE 25 MG PO TABS: 25.0000 mg | ORAL_TABLET | Freq: Every day | ORAL | Status: DC

## 2019-11-11 MED ORDER — METOCLOPRAMIDE HCL 5 MG PO TABS: 5.0000 mg | ORAL_TABLET | Freq: Three times a day (TID) | ORAL | Status: DC | PRN

## 2019-11-11 MED ORDER — BUPIVACAINE HCL (PF) 0.5 % IJ SOLN
INTRAMUSCULAR | Status: DC | PRN
  Administered 2019-11-11: 10 mL

## 2019-11-11 MED ORDER — GLYCOPYRROLATE 0.2 MG/ML IJ SOLN
INTRAMUSCULAR | Status: DC | PRN
  Administered 2019-11-11: 0.2 mg via INTRAVENOUS

## 2019-11-11 MED ORDER — CEFAZOLIN SODIUM-DEXTROSE 1-4 GM/50ML-% IV SOLN
1.0000 g | Freq: Four times a day (QID) | INTRAVENOUS | Status: AC
  Administered 2019-11-11 – 2019-11-12 (×3): 1 g via INTRAVENOUS
  Filled 2019-11-11 (×3): qty 50

## 2019-11-11 MED ORDER — ONDANSETRON HCL 4 MG PO TABS: 4.0000 mg | ORAL_TABLET | Freq: Four times a day (QID) | ORAL | Status: DC | PRN

## 2019-11-11 MED ORDER — BUPIVACAINE LIPOSOME 1.3 % IJ SUSP
INTRAMUSCULAR | Status: DC | PRN
  Administered 2019-11-11: 10 mL

## 2019-11-11 MED ORDER — CEFAZOLIN SODIUM-DEXTROSE 2-4 GM/100ML-% IV SOLN
2.0000 g | INTRAVENOUS | Status: AC
  Administered 2019-11-11: 2 g via INTRAVENOUS
  Filled 2019-11-11: qty 100

## 2019-11-11 MED ORDER — PANTOPRAZOLE SODIUM 40 MG PO TBEC
40.0000 mg | DELAYED_RELEASE_TABLET | Freq: Every day | ORAL | Status: DC
  Administered 2019-11-12 – 2019-11-16 (×5): 40 mg via ORAL
  Filled 2019-11-11 (×5): qty 1

## 2019-11-11 MED ORDER — ASPIRIN EC 81 MG PO TBEC
81.0000 mg | DELAYED_RELEASE_TABLET | Freq: Every day | ORAL | Status: DC
  Administered 2019-11-12 – 2019-11-16 (×5): 81 mg via ORAL
  Filled 2019-11-11 (×5): qty 1

## 2019-11-11 MED ORDER — ALBUTEROL SULFATE (2.5 MG/3ML) 0.083% IN NEBU: 2.5000 mg | INHALATION_SOLUTION | Freq: Four times a day (QID) | RESPIRATORY_TRACT | Status: DC | PRN

## 2019-11-11 MED ORDER — PROPOFOL 10 MG/ML IV BOLUS
INTRAVENOUS | Status: DC | PRN
  Administered 2019-11-11: 150 mg via INTRAVENOUS

## 2019-11-11 MED ORDER — AMLODIPINE BESYLATE 10 MG PO TABS
10.0000 mg | ORAL_TABLET | Freq: Every day | ORAL | Status: DC
  Administered 2019-11-12 – 2019-11-16 (×5): 10 mg via ORAL
  Filled 2019-11-11 (×5): qty 1

## 2019-11-11 MED ORDER — LACTATED RINGERS IV SOLN
INTRAVENOUS | Status: DC
  Administered 2019-11-11: 09:00:00 via INTRAVENOUS

## 2019-11-11 MED ORDER — INSULIN GLARGINE 100 UNIT/ML ~~LOC~~ SOLN
40.0000 [IU] | Freq: Every day | SUBCUTANEOUS | Status: DC
  Administered 2019-11-12 – 2019-11-15 (×4): 40 [IU] via SUBCUTANEOUS
  Filled 2019-11-11 (×5): qty 0.4

## 2019-11-11 MED ORDER — MIRABEGRON ER 50 MG PO TB24
50.0000 mg | ORAL_TABLET | Freq: Every day | ORAL | Status: DC
  Administered 2019-11-12 – 2019-11-15 (×4): 50 mg via ORAL
  Filled 2019-11-11 (×5): qty 1

## 2019-11-11 MED ORDER — INSULIN DEGLUDEC 200 UNIT/ML ~~LOC~~ SOPN: 40.0000 [IU] | PEN_INJECTOR | Freq: Every day | SUBCUTANEOUS | Status: DC

## 2019-11-11 MED ORDER — IPRATROPIUM BROMIDE 0.02 % IN SOLN: 0.5000 mg | Freq: Four times a day (QID) | RESPIRATORY_TRACT | Status: DC | PRN

## 2019-11-11 MED ORDER — FLUTICASONE PROPIONATE 50 MCG/ACT NA SUSP
1.0000 | Freq: Every day | NASAL | Status: DC | PRN
  Filled 2019-11-11: qty 16

## 2019-11-11 MED ORDER — DEXAMETHASONE SODIUM PHOSPHATE 10 MG/ML IJ SOLN
INTRAMUSCULAR | Status: DC | PRN
  Administered 2019-11-11: 5 mg via INTRAVENOUS

## 2019-11-11 MED ORDER — INSULIN ASPART 100 UNIT/ML ~~LOC~~ SOLN
0.0000 [IU] | Freq: Three times a day (TID) | SUBCUTANEOUS | Status: DC
  Administered 2019-11-11 – 2019-11-12 (×2): 8 [IU] via SUBCUTANEOUS
  Administered 2019-11-12 – 2019-11-13 (×3): 5 [IU] via SUBCUTANEOUS
  Administered 2019-11-13: 8 [IU] via SUBCUTANEOUS
  Administered 2019-11-13: 2 [IU] via SUBCUTANEOUS
  Administered 2019-11-14: 3 [IU] via SUBCUTANEOUS
  Administered 2019-11-14: 8 [IU] via SUBCUTANEOUS
  Administered 2019-11-14: 3 [IU] via SUBCUTANEOUS
  Administered 2019-11-15: 5 [IU] via SUBCUTANEOUS
  Administered 2019-11-15: 3 [IU] via SUBCUTANEOUS
  Administered 2019-11-15: 8 [IU] via SUBCUTANEOUS
  Administered 2019-11-16: 5 [IU] via SUBCUTANEOUS

## 2019-11-11 MED ORDER — MIDAZOLAM HCL 2 MG/2ML IJ SOLN
INTRAMUSCULAR | Status: AC
  Filled 2019-11-11: qty 2

## 2019-11-11 MED ORDER — FENTANYL CITRATE (PF) 250 MCG/5ML IJ SOLN
INTRAMUSCULAR | Status: AC
  Filled 2019-11-11: qty 5

## 2019-11-11 MED ORDER — SUCCINYLCHOLINE CHLORIDE 20 MG/ML IJ SOLN
INTRAMUSCULAR | Status: DC | PRN
  Administered 2019-11-11: 160 mg via INTRAVENOUS

## 2019-11-11 MED ORDER — FENTANYL CITRATE (PF) 100 MCG/2ML IJ SOLN
25.0000 ug | INTRAMUSCULAR | Status: DC | PRN
  Administered 2019-11-11 (×3): 25 ug via INTRAVENOUS

## 2019-11-11 MED ORDER — CHLORHEXIDINE GLUCONATE 4 % EX LIQD: 60.0000 mL | Freq: Once | CUTANEOUS | Status: DC

## 2019-11-11 MED ORDER — METFORMIN HCL ER 500 MG PO TB24
500.0000 mg | ORAL_TABLET | Freq: Every day | ORAL | Status: DC
  Administered 2019-11-12 – 2019-11-16 (×5): 500 mg via ORAL
  Filled 2019-11-11 (×5): qty 1

## 2019-11-11 MED ORDER — ONDANSETRON HCL 4 MG/2ML IJ SOLN
4.0000 mg | Freq: Four times a day (QID) | INTRAMUSCULAR | Status: DC | PRN
  Administered 2019-11-15: 4 mg via INTRAVENOUS
  Filled 2019-11-11: qty 2

## 2019-11-11 MED ORDER — PHENYLEPHRINE HCL (PRESSORS) 10 MG/ML IV SOLN
INTRAVENOUS | Status: DC | PRN
  Administered 2019-11-11 (×2): 80 ug via INTRAVENOUS

## 2019-11-11 MED ORDER — ALBUTEROL SULFATE HFA 108 (90 BASE) MCG/ACT IN AERS: 2.0000 | INHALATION_SPRAY | Freq: Four times a day (QID) | RESPIRATORY_TRACT | Status: DC | PRN

## 2019-11-11 MED ORDER — DOCUSATE SODIUM 100 MG PO CAPS
100.0000 mg | ORAL_CAPSULE | Freq: Two times a day (BID) | ORAL | Status: DC
  Administered 2019-11-11 – 2019-11-16 (×7): 100 mg via ORAL
  Filled 2019-11-11 (×10): qty 1

## 2019-11-11 MED ORDER — ROSUVASTATIN CALCIUM 20 MG PO TABS
20.0000 mg | ORAL_TABLET | Freq: Every day | ORAL | Status: DC
  Administered 2019-11-11 – 2019-11-16 (×6): 20 mg via ORAL
  Filled 2019-11-11 (×6): qty 1

## 2019-11-11 MED ORDER — 0.9 % SODIUM CHLORIDE (POUR BTL) OPTIME
TOPICAL | Status: DC | PRN
  Administered 2019-11-11: 1000 mL

## 2019-11-11 MED ORDER — TRAZODONE HCL 50 MG PO TABS
100.0000 mg | ORAL_TABLET | Freq: Every evening | ORAL | Status: DC | PRN
  Administered 2019-11-13: 100 mg via ORAL
  Filled 2019-11-11: qty 2

## 2019-11-11 MED ORDER — UMECLIDINIUM-VILANTEROL 62.5-25 MCG/INH IN AEPB
1.0000 | INHALATION_SPRAY | Freq: Every day | RESPIRATORY_TRACT | Status: DC
  Administered 2019-11-12 – 2019-11-16 (×5): 1 via RESPIRATORY_TRACT
  Filled 2019-11-11 (×2): qty 14

## 2019-11-11 MED ORDER — ONDANSETRON HCL 4 MG/2ML IJ SOLN
INTRAMUSCULAR | Status: DC | PRN
  Administered 2019-11-11: 4 mg via INTRAVENOUS

## 2019-11-11 MED ORDER — ONDANSETRON HCL 4 MG/2ML IJ SOLN: 4.0000 mg | Freq: Once | INTRAMUSCULAR | Status: DC | PRN

## 2019-11-11 MED ORDER — MEPERIDINE HCL 25 MG/ML IJ SOLN: 6.2500 mg | INTRAMUSCULAR | Status: DC | PRN

## 2019-11-11 MED ORDER — HYDROMORPHONE HCL 1 MG/ML IJ SOLN
0.5000 mg | INTRAMUSCULAR | Status: DC | PRN
  Administered 2019-11-14 – 2019-11-16 (×4): 0.5 mg via INTRAVENOUS
  Filled 2019-11-11 (×4): qty 1

## 2019-11-11 MED ORDER — OXYCODONE HCL 5 MG PO TABS
5.0000 mg | ORAL_TABLET | ORAL | Status: DC | PRN
  Administered 2019-11-11 – 2019-11-16 (×17): 10 mg via ORAL
  Filled 2019-11-11 (×17): qty 2

## 2019-11-11 MED ORDER — BUPROPION HCL ER (XL) 150 MG PO TB24
300.0000 mg | ORAL_TABLET | Freq: Every day | ORAL | Status: DC
  Administered 2019-11-12 – 2019-11-16 (×5): 300 mg via ORAL
  Filled 2019-11-11 (×5): qty 2

## 2019-11-11 SURGICAL SUPPLY — 35 items
BLADE EXCALIBUR 4.0X13 (MISCELLANEOUS) ×2 IMPLANT
BUR OVAL 6.0 (BURR) ×2 IMPLANT
CANNULA SHOULDER 7CM (CANNULA) ×2 IMPLANT
COVER SURGICAL LIGHT HANDLE (MISCELLANEOUS) ×4 IMPLANT
COVER WAND RF STERILE (DRAPES) ×2 IMPLANT
DRAPE STERI 35X30 U-POUCH (DRAPES) ×2 IMPLANT
DRAPE U-SHAPE 47X51 STRL (DRAPES) ×2 IMPLANT
DRSG ADAPTIC 3X8 NADH LF (GAUZE/BANDAGES/DRESSINGS) ×1 IMPLANT
DRSG EMULSION OIL 3X3 NADH (GAUZE/BANDAGES/DRESSINGS) ×2 IMPLANT
DRSG PAD ABDOMINAL 8X10 ST (GAUZE/BANDAGES/DRESSINGS) ×4 IMPLANT
DURAPREP 26ML APPLICATOR (WOUND CARE) ×2 IMPLANT
GAUZE SPONGE 4X4 12PLY STRL (GAUZE/BANDAGES/DRESSINGS) ×2 IMPLANT
GAUZE SPONGE 4X4 12PLY STRL LF (GAUZE/BANDAGES/DRESSINGS) ×1 IMPLANT
GLOVE BIOGEL PI IND STRL 9 (GLOVE) ×1 IMPLANT
GLOVE BIOGEL PI INDICATOR 9 (GLOVE) ×1
GLOVE SURG ORTHO 9.0 STRL STRW (GLOVE) ×2 IMPLANT
GOWN STRL REUS W/ TWL XL LVL3 (GOWN DISPOSABLE) ×2 IMPLANT
GOWN STRL REUS W/TWL XL LVL3 (GOWN DISPOSABLE) ×4
KIT BASIN OR (CUSTOM PROCEDURE TRAY) ×2 IMPLANT
KIT TURNOVER KIT B (KITS) ×2 IMPLANT
MANIFOLD NEPTUNE II (INSTRUMENTS) ×2 IMPLANT
NDL SPNL 18GX3.5 QUINCKE PK (NEEDLE) ×1 IMPLANT
NEEDLE SPNL 18GX3.5 QUINCKE PK (NEEDLE) ×2 IMPLANT
NS IRRIG 1000ML POUR BTL (IV SOLUTION) ×2 IMPLANT
PACK SHOULDER (CUSTOM PROCEDURE TRAY) ×2 IMPLANT
PAD ABD 8X10 STRL (GAUZE/BANDAGES/DRESSINGS) ×1 IMPLANT
PAD ARMBOARD 7.5X6 YLW CONV (MISCELLANEOUS) ×4 IMPLANT
PROBE APOLLO 90XL (SURGICAL WAND) ×1 IMPLANT
SLING ARM FOAM STRAP XLG (SOFTGOODS) ×1 IMPLANT
SLING ARM IMMOBILIZER XL (CAST SUPPLIES) ×2 IMPLANT
SUT ETHILON 2 0 FS 18 (SUTURE) ×2 IMPLANT
TOWEL GREEN STERILE FF (TOWEL DISPOSABLE) ×4 IMPLANT
TUBING ARTHROSCOPY IRRIG 16FT (MISCELLANEOUS) ×2 IMPLANT
WAND STAR VAC 90 (SURGICAL WAND) IMPLANT
WATER STERILE IRR 1000ML POUR (IV SOLUTION) ×2 IMPLANT

## 2019-11-11 NOTE — Anesthesia Procedure Notes (Signed)
Procedure Name: Intubation Date/Time: 11/11/2019 9:33 AM Performed by: Avira Tillison T, CRNA Pre-anesthesia Checklist: Patient identified, Emergency Drugs available, Suction available and Patient being monitored Patient Re-evaluated:Patient Re-evaluated prior to induction Oxygen Delivery Method: Circle system utilized Preoxygenation: Pre-oxygenation with 100% oxygen Induction Type: IV induction, Rapid sequence and Cricoid Pressure applied Laryngoscope Size: Miller and 3 Grade View: Grade I Tube type: Oral Tube size: 7.5 mm Number of attempts: 1 Airway Equipment and Method: Patient positioned with wedge pillow and Stylet Placement Confirmation: ETT inserted through vocal cords under direct vision,  positive ETCO2 and breath sounds checked- equal and bilateral Secured at: 21 cm Tube secured with: Tape Dental Injury: Teeth and Oropharynx as per pre-operative assessment

## 2019-11-11 NOTE — Discharge Summary (Signed)
Discharge Diagnoses:  Active Problems:   Impingement syndrome of right shoulder   Nontraumatic incomplete tear of right rotator cuff   Pain in joint of right shoulder   Surgeries: Procedure(s): RIGHT SHOULDER ARTHROSCOPY AND DEBRIDEMENT on 11/11/2019    Consultants:   Discharged Condition: Improved  Hospital Course: Jennifer [name] is an 58 y.o. female who was admitted 11/11/2019 with a chief complaint of Right Shoulder Pain, with a final diagnosis of Right Shoulder Rotator Cuff Tear.  Patient was brought to the operating room on 11/11/2019 and underwent Procedure(s): RIGHT SHOULDER ARTHROSCOPY AND DEBRIDEMENT.    Patient was given perioperative antibiotics:  Anti-infectives (From admission, onward)   Start     Dose/Rate Route Frequency Ordered Stop   11/11/19 0715  ceFAZolin (ANCEF) IVPB 2g/100 mL premix     2 g 200 mL/hr over 30 Minutes Intravenous On call to O.R. 11/11/19 [id number] 11/11/19 0940    .  Patient was given sequential compression devices, early ambulation, and aspirin for DVT prophylaxis.  Recent vital signs:  Patient Vitals for the past 24 hrs:  BP Temp Temp src Pulse Resp SpO2 Height Weight  11/11/19 1130 138/79 -- -- 67 (!) 27 95 % -- --  11/11/19 1115 140/76 -- -- 67 (!) 22 91 % -- --  11/11/19 1100 140/68 -- -- 65 11 95 % -- --  11/11/19 0750 (!) 160/77 97.9 F (36.6 C) Oral 78 20 97 % 5\' 7"  (1.702 m) 115.1 kg  .  Recent laboratory studies: No results found.  Discharge Medications:   Allergies as of 11/11/2019      Reactions   Iohexol Other (See Comments)    Desc: IV CONTRAST CAUSE NEPHROPATHY IN 2007   Ivp Dye [iodinated Diagnostic Agents] Other (See Comments)   Desc: IV CONTRAST CAUSE NEPHROPATHY IN 2007   Abilify [aripiprazole] Other (See Comments)   Urinary freq Nov 2016   Morphine Sulfate Itching, Rash      Medication List    TAKE these medications   albuterol (2.5 MG/3ML) 0.083% nebulizer solution Commonly known as: PROVENTIL INHALE THE  CONTENTS OF 1 VIAL VIA NEBULIZER EVERY 6 HOURS AS NEEDED FOR WHEEZING What changed: See the new instructions.   ProAir HFA 108 (90 Base) MCG/ACT inhaler Generic drug: albuterol INHALE 2 PUFFS BY MOUTH EVERY 6 HOURS AS NEEDED FOR WHEEZING What changed: See the new instructions.   amLODipine 10 MG tablet Commonly known as: NORVASC Take 1 tablet (10 mg total) by mouth daily.   Anoro Ellipta 62.5-25 MCG/INH Aepb Generic drug: umeclidinium-vilanterol TAKE 1 PUFF BY MOUTH EVERY DAY What changed: See the new instructions.   aspirin EC 81 MG tablet Take 81 mg by mouth daily.   atenolol 100 MG tablet Commonly known as: TENORMIN TAKE 1 TABLET BY MOUTH EVERY DAY   baclofen 10 MG tablet Commonly known as: LIORESAL TAKE 1 TABLET BY MOUTH THREE TIMES A DAY AS NEEDED FOR MUSCLE SPASMS What changed: See the new instructions.   benazepril 40 MG tablet Commonly known as: LOTENSIN Take 1 tablet (40 mg total) by mouth daily.   benzonatate 100 MG capsule Commonly known as: Tessalon Perles Take 1 capsule (100 mg total) by mouth at bedtime as needed for cough.   betamethasone dipropionate 0.05 % cream Apply 1 application topically 2 (two) times daily as needed (irritation).   Biotin 5 MG Tabs Take 5 mg by mouth daily.   buPROPion 300 MG 24 hr tablet Commonly known as: WELLBUTRIN XL TAKE 1  TABLET BY MOUTH DAILY   cholecalciferol 25 MCG (1000 UT) tablet Commonly known as: VITAMIN D3 Take 1,000 Units by mouth daily.   Fish Oil 1000 MG Caps Take 1,000 mg by mouth daily.   fluticasone 50 MCG/ACT nasal spray Commonly known as: FLONASE INSTILL 1 SPRAY IN EACH NOSTRIL DAILY What changed:   how much to take  how to take this  when to take this  reasons to take this  additional instructions   furosemide 40 MG tablet Commonly known as: LASIX TAKE ONE TABLET BY MOUTH DAILY AS NEEDED What changed: reasons to take this   hydrochlorothiazide 25 MG tablet Commonly known as:  HYDRODIURIL TAKE 1 TABLET BY MOUTH ONCE DAILY   hydrOXYzine 25 MG capsule Commonly known as: VISTARIL Take 2 capsules (50 mg total) by mouth daily.   insulin lispro 100 UNIT/ML KwikPen Commonly known as: HumaLOG KwikPen If blood sugar is < 175 Do not take any correction insulin. 176-225 inject 2 units. 226-275 inject 4 units. [id number] inject 6 units. 326-375 inject 8 units. 376-425 inject 10 units. 426-475 inject 12 units. [id number] inject 14 units and call office for further instructions. What changed:   how much to take  how to take this  when to take this  additional instructions   Insulin Pen Needle 32G X 4 MM Misc Use to inject insulin 4 times a day and semaglutide once weekly   Insulin Syringe-Needle U-100 31G X 15/64" 0.3 ML Misc Use with Humalog to correct blood sugar before meals three times a day   ipratropium 0.02 % nebulizer solution Commonly known as: ATROVENT USE 1 VIAL VIA NEBULIZER EVERY 6 HOURS AS NEEDED FOR WHEEZING   metFORMIN 500 MG 24 hr tablet Commonly known as: GLUCOPHAGE-XR TAKE 1 TABLET BY MOUTH EVERY DAY WITH BREAKFAST What changed: See the new instructions.   multivitamin with minerals Tabs tablet Take 1 tablet by mouth daily.   Myrbetriq 50 MG Tb24 tablet Generic drug: mirabegron ER TAKE 1 TABLET BY MOUTH DAILY   nystatin powder Commonly known as: nystatin APPLY TO AFFECTED AREA THREE TIMES A DAY What changed:   how much to take  how to take this  when to take this   omeprazole 20 MG capsule Commonly known as: PRILOSEC TAKE 1 CAPSULE (20 MG TOTAL) BY MOUTH 2 (TWO) TIMES DAILY BEFORE A MEAL.   OneTouch Verio test strip Generic drug: glucose blood USE TO TEST BLOOD SUGAR 3-4 TIMES DAILY   oxyCODONE-acetaminophen 5-325 MG tablet Commonly known as: Percocet Take 1 tablet by mouth every 4 (four) hours as needed for severe pain. What changed:   how much to take  how to take this  when to take this  reasons to take  this  additional instructions   Ozempic (0.25 or 0.5 MG/DOSE) 2 MG/1.5ML Sopn Generic drug: Semaglutide(0.25 or 0.5MG /DOS) INJECT 0.5 MG INTO THE SKIN ONCE A WEEK.   pregabalin 200 MG capsule Commonly known as: LYRICA TAKE 1 CAPSULE BY MOUTH THREE TIMES A DAY What changed: See the new instructions.   rosuvastatin 20 MG tablet Commonly known as: CRESTOR TAKE 1 TABLET BY MOUTH AT BEDTIME What changed: when to take this   traZODone 100 MG tablet Commonly known as: DESYREL Take 100 mg by mouth at bedtime as needed for sleep.   [name] FlexTouch 200 UNIT/ML Sopn Generic drug: Insulin Degludec Inject 70 Units into the skin daily.   vitamin B-12 500 MCG tablet Commonly known as: CYANOCOBALAMIN Take 500 mcg by  mouth daily.   vitamin C 500 MG tablet Commonly known as: ASCORBIC ACID Take 500 mg by mouth daily.       Diagnostic Studies: Dg Chest Port 1 View  Result Date: 10/17/2019 CLINICAL DATA:  58 year old female with fever and cough. EXAM: PORTABLE CHEST 1 VIEW COMPARISON:  Chest radiograph dated 05/11/2019 FINDINGS: There is mild cardiomegaly with mild vascular congestion. Bibasilar densities, right greater left concerning for infiltrate. Clinical correlation and follow-up to resolution recommended. There is no large pleural effusion. No pneumothorax. Atherosclerotic calcification of the aorta. No acute osseous pathology. IMPRESSION: 1. Mild cardiomegaly with mild vascular congestion. 2. Bibasilar densities, right greater left concerning for infiltrate. Clinical correlation and follow-up to resolution recommended. Electronically Signed   By: Anner Crete M.D.   On: 10/17/2019 00:49    Patient benefited maximally from their hospital stay and there were no complications.     Disposition: Discharge disposition: 01-Home or Self Care      Discharge Instructions    Call MD / Call 911   Complete by: As directed    If you experience chest pain or shortness of breath, CALL  911 and be transported to the hospital emergency room.  If you develope a fever above 101 F, pus (white drainage) or increased drainage or redness at the wound, or calf pain, call your surgeon's office.   Constipation Prevention   Complete by: As directed    Drink plenty of fluids.  Prune juice may be helpful.  You may use a stool softener, such as Colace (over the counter) 100 mg twice a day.  Use MiraLax (over the counter) for constipation as needed.   Diet - low sodium heart healthy   Complete by: As directed    Discharge instructions   Complete by: As directed    Keep dressing clean and dry. Follow up 1 week Dr. Sharol Given   Increase activity slowly as tolerated   Complete by: As directed      Follow-up Information    Newt Minion, MD Follow up in 1 week(s).   Specialty: Orthopedic Surgery Contact information: [address] [place] [address] [phone]            Signed: Bevely Palmer Zollie Clemence 11/11/2019, 12:02 PM

## 2019-11-11 NOTE — H&P (Signed)
Jennifer [name] is an 58 y.o. female.   Chief Complaint: Right Shoulder Pain HPI:  Patient is a 58 year old woman with pain in her right shoulder she is status post an MRI scan.  Patient states she cannot get her hand to the top of her head.  She has pain with activities of daily living. Past Medical History:  Diagnosis Date  . Anginal pain [facility])    '3' of 10 ischemia ruled out 9/9   . Arthritis of lumbar spine   . Asthma   . Cataract   . CHF (congestive heart failure) ([place])   . 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 Chronic bronchitis ([place])    "I get it alot" (09/28/2013)  . Chronic diastolic heart failure (HCC)    grade 2 per 2D echocardiogram (01/2013)  . Chronic lower back pain   . Chronic osteomyelitis of foot (HCC)    chronic, right secondary to diabetic foot ulcers  . Chronic pain syndrome 12/03/2011   Likely secondary to depression, "fibromyalgia", neuropathy, and obesity. Lumbar MRI 2014 no sig change from prior (2008) : Stable hypertrophic facet disease most notable at L4-5. Stable shallow left foraminal/extraforaminal disc protrusion at L4-5. No direct neural compression.      Marland Kitchen COPD 01/08/2007   PFT's 05/2007 : FEV1/FVC 82, FEV1 64% pred, FEF 25-75% 40% predicted, 16% improvement in FEV1 with bronchodilators.     . Depression   . Diabetes mellitus without complication ([place])    Type II  . Diabetic peripheral neuropathy ([place])   . DVT of upper extremity (deep vein thrombosis) ([place]) 03/11/2013   Secondary to PICC line. Right brachial vein, diagnosed on 03/10/2013 Coumadin for 3 months. End date 06/10/2013   . Dyspnea    "smoker"  . Environmental allergies    Hx: of  . Exertional shortness of breath   . Fatty liver 2003   observed on ultrasound abdomen  . Fibromyalgia   . GERD (gastroesophageal reflux disease)   . Glaucoma   . History of use of hearing aid   . Hyperlipidemia   . Hyperplastic colon polyp 12/2010   Per colonoscopy (12/2010) - Dr. Deatra Ina  .  Hypertension   . 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 Blood cultures 01/26/13 S. Aureus and GBS. Blood cultures Feb 6th, 8th, and 9th and March were negative.Repeat TEE 3/20 negative for vegitations  . 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 2/2 tobacco use, DM, and cont weight bearing on surgical wound and developed gangrene   . Pneumonia   . Polymicrobial bacterial infection 01/2013   GBS and S. aureus bacteremia // Source likely infected diabetic foot ulcer  . PVD (peripheral vascular disease) with claudication ([place])    Stents to bilateral common iliac arteries (left 2005, right 2008), on chronic plavix  . Rheumatoid arthritis ([place])   . S/P BKA (below knee amputation) unilateral [facility])    Oct 2014 L - failed limb preserving treatment. 2/2 tobacco use, DM, and cont weight bearing on surgical wound and developed gangrene   . Tobacco abuse   . Type II diabetes mellitus with peripheral circulatory disorders, uncontrolled DX: 1993   Insulin dep. Poor control. Complicated by diabetic foot ulcer and diabetic eye disease.    Marland Kitchen Ulcer of foot, chronic (HCC)    Left. No OM per MRI (01/2013)    Past Surgical  History:  Procedure Laterality Date  . ABDOMINAL HYSTERECTOMY  1997   secondary to uterine fibroids  . AMPUTATION Left 08/31/2013   Procedure: AMPUTATION RAY;  Surgeon: Newt Minion, MD;  Location: [place];  Service: Orthopedics;  Laterality: Left;  Left Foot 5th Ray Amputation  . AMPUTATION Left 09/28/2013   Procedure: Left Midfoot amputation;  Surgeon: Newt Minion, MD;  Location: [place];  Service: Orthopedics;  Laterality: Left;  Left Midfoot amputation  . AMPUTATION Left 10/14/2013   Procedure: AMPUTATION BELOW KNEE- left;  Surgeon: Newt Minion, MD;  Location: [place];  Service: Orthopedics;  Laterality: Left;  Left Below Knee Amputation   . AMPUTATION TOE Right  01/15/2017   Procedure: AMPUTATION 5th TOE RIGHT FOOT;  Surgeon: Edrick Kins, DPM;  Location: [place];  Service: Podiatry;  Laterality: Right;  . APPLICATION OF WOUND VAC  04/01/2019   Procedure: Application Of Wound Vac;  Surgeon: Newt Minion, MD;  Location: [place];  Service: Orthopedics;;  . BLADDER SURGERY     bladder reconstruction surgery  . BREAST BIOPSY     multiple-benign per pt  . COLONOSCOPY    . ESOPHAGOGASTRODUODENOSCOPY N/A 09/20/2013   Procedure: ESOPHAGOGASTRODUODENOSCOPY (EGD);  Surgeon: Jerene Bears, MD;  Location: [place];  Service: Gastroenterology;  Laterality: N/A;  . FOOT AMPUTATION THROUGH METATARSAL Left 09/28/2013  . GANGLION CYST EXCISION     multiple  . PERIPHERAL VASCULAR INTERVENTION     stents in lower ext  . SHOULDER ARTHROSCOPY W/ ROTATOR CUFF REPAIR Bilateral   . SKIN SPLIT GRAFT Bilateral 05/13/2013   Procedure: Right and Left Foot Allograft Skin Graft;  Surgeon: Newt Minion, MD;  Location: [facility];  Service: Orthopedics;  Laterality: Bilateral;  Right and Left Foot Allograft Skin Graft  . STUMP REVISION Left 04/01/2019   Procedure: REVISION LEFT BELOW KNEE AMPUTATION;  Surgeon: Newt Minion, MD;  Location: [name];  Service: Orthopedics;  Laterality: Left;  . TEE WITHOUT CARDIOVERSION N/A 01/31/2013   Procedure: TRANSESOPHAGEAL ECHOCARDIOGRAM (TEE);  Surgeon: Fay Records, MD;  Location: [place];  Service: Cardiovascular;  Laterality: N/A;  Rm [phone]  . TEE WITHOUT CARDIOVERSION N/A 03/10/2013   Procedure: TRANSESOPHAGEAL ECHOCARDIOGRAM (TEE);  Surgeon: Larey Dresser, MD;  Location: [place];  Service: Cardiovascular;  Laterality: N/A;  Rm. [phone]  . TOE AMPUTATION Left 08/31/2013   4TH & 5 TH TOE   . TONSILLECTOMY    . TUBAL LIGATION    . WRIST SURGERY Right    "for tumors" (09/28/2013)    Family History  Problem Relation Age of Onset  . Diverticulosis Mother   . Diabetes Mother   . Hypertension Mother   . Congestive Heart Failure Mother    . Asthma Father   . CAD Sister 17       MI at age 63 per patient.  However, she has not had a stent or CABG.   . Heart disease Sister        before age 3  . Breast cancer Neg Hx    Social History:  reports that she has been smoking cigarettes. She has a 50.00 pack-year smoking history. She has never used smokeless tobacco. She reports that she does not drink alcohol or use drugs.  Allergies:  Allergies  Allergen Reactions  . Iohexol Other (See Comments)     Desc: IV CONTRAST CAUSE NEPHROPATHY IN 2007   . Ivp Dye [Iodinated Diagnostic Agents] Other (See Comments)  Desc: IV CONTRAST CAUSE NEPHROPATHY IN 2007  . Abilify [Aripiprazole] Other (See Comments)    Urinary freq Nov 2016  . Morphine Sulfate Itching and Rash    Medications Prior to Admission  Medication Sig Dispense Refill  . albuterol (PROVENTIL) (2.5 MG/3ML) 0.083% nebulizer solution INHALE THE CONTENTS OF 1 VIAL VIA NEBULIZER EVERY 6 HOURS AS NEEDED FOR WHEEZING (Patient taking differently: Take 2.5 mg by nebulization every 6 (six) hours as needed for wheezing. ) 360 mL 1  . amLODipine (NORVASC) 10 MG tablet Take 1 tablet (10 mg total) by mouth daily. 30 tablet 11  . ANORO ELLIPTA 62.5-25 MCG/INH AEPB TAKE 1 PUFF BY MOUTH EVERY DAY (Patient taking differently: Inhale 1 puff into the lungs daily. ) 60 each 3  . aspirin EC 81 MG tablet Take 81 mg by mouth daily.    Marland Kitchen atenolol (TENORMIN) 100 MG tablet TAKE 1 TABLET BY MOUTH EVERY DAY (Patient taking differently: Take 100 mg by mouth daily. ) 90 tablet 2  . baclofen (LIORESAL) 10 MG tablet TAKE 1 TABLET BY MOUTH THREE TIMES A DAY AS NEEDED FOR MUSCLE SPASMS (Patient taking differently: Take 10 mg by mouth 3 (three) times daily as needed for muscle spasms. ) 90 tablet 5  . betamethasone dipropionate (DIPROLENE) 0.05 % cream Apply 1 application topically 2 (two) times daily as needed (irritation).    . Biotin 5 MG TABS Take 5 mg by mouth daily.    Marland Kitchen buPROPion (WELLBUTRIN XL)  300 MG 24 hr tablet TAKE 1 TABLET BY MOUTH DAILY 90 tablet 3  . cholecalciferol (VITAMIN D3) 25 MCG (1000 UT) tablet Take 1,000 Units by mouth daily.    . fluticasone (FLONASE) 50 MCG/ACT nasal spray INSTILL 1 SPRAY IN EACH NOSTRIL DAILY (Patient taking differently: Place 1 spray into both nostrils daily as needed for allergies. INSTILL 1 SPRAY IN EACH NOSTRIL DAILY PRN) 48 g 3  . hydrOXYzine (VISTARIL) 25 MG capsule Take 2 capsules (50 mg total) by mouth daily. 180 capsule 0  . Insulin Degludec (TRESIBA FLEXTOUCH) 200 UNIT/ML SOPN Inject 70 Units into the skin daily. 7 pen 3  . insulin lispro (HUMALOG KWIKPEN) 100 UNIT/ML KwikPen If blood sugar is < 175 Do not take any correction insulin. 176-225 inject 2 units. 226-275 inject 4 units. [id number] inject 6 units. 326-375 inject 8 units. 376-425 inject 10 units. 426-475 inject 12 units. [id number] inject 14 units and call office for further instructions. (Patient taking differently: Inject 0-16 Units into the skin 3 (three) times daily. If blood sugar is < 175 Do not take any correction insulin. 176-225 inject 4 units. 226-275 inject 6 units. [id number] inject 8 units. 326-375 inject 10 units. 376-425 inject 12 units. 426-475 inject 14 units. [id number] inject 16 units and call office for further instructions.) 15 mL 11  . Insulin Pen Needle 32G X 4 MM MISC Use to inject insulin 4 times a day and semaglutide once weekly 400 each 3  . ipratropium (ATROVENT) 0.02 % nebulizer solution USE 1 VIAL VIA NEBULIZER EVERY 6 HOURS AS NEEDED FOR WHEEZING (Patient taking differently: USE 1 VIAL VIA NEBULIZER EVERY 6 HOURS AS NEEDED FOR WHEEZING) 62.5 mL 3  . metFORMIN (GLUCOPHAGE-XR) 500 MG 24 hr tablet TAKE 1 TABLET BY MOUTH EVERY DAY WITH BREAKFAST (Patient taking differently: Take 500 mg by mouth daily with breakfast. ) 270 tablet 3  . Multiple Vitamin (MULTIVITAMIN WITH MINERALS) TABS tablet Take 1 tablet by mouth daily.    Marland Kitchen MYRBETRIQ 50  MG TB24 tablet TAKE 1 TABLET BY MOUTH  DAILY 90 tablet 2  . nystatin (NYSTATIN) powder APPLY TO AFFECTED AREA THREE TIMES A DAY (Patient taking differently: Apply 1 Bottle topically 3 (three) times daily. APPLY TO AFFECTED AREA THREE TIMES A DAY) 60 g 5  . Omega-3 Fatty Acids (FISH OIL) 1000 MG CAPS Take 1,000 mg by mouth daily.    Marland Kitchen omeprazole (PRILOSEC) 20 MG capsule TAKE 1 CAPSULE (20 MG TOTAL) BY MOUTH 2 (TWO) TIMES DAILY BEFORE A MEAL. 180 capsule 3  . oxyCODONE-acetaminophen (PERCOCET/ROXICET) 5-325 MG tablet Take 1 tablet by mouth every 8 (eight) hours as needed. May take a 4th pill if severe pain (up to 10 days monthly) #100 equals 30 day supply 100 tablet 0  . OZEMPIC, 0.25 OR 0.5 MG/DOSE, 2 MG/1.5ML SOPN INJECT 0.5 MG INTO THE SKIN ONCE A WEEK. 4 pen 5  . pregabalin (LYRICA) 200 MG capsule TAKE 1 CAPSULE BY MOUTH THREE TIMES A DAY (Patient taking differently: Take 200 mg by mouth 3 (three) times daily. ) 270 capsule 0  . PROAIR HFA 108 (90 Base) MCG/ACT inhaler INHALE 2 PUFFS BY MOUTH EVERY 6 HOURS AS NEEDED FOR WHEEZING (Patient taking differently: Inhale 2 puffs into the lungs every 6 (six) hours as needed for wheezing. ) 54 g 3  . rosuvastatin (CRESTOR) 20 MG tablet TAKE 1 TABLET BY MOUTH AT BEDTIME (Patient taking differently: Take 20 mg by mouth daily. ) 90 tablet 3  . traZODone (DESYREL) 100 MG tablet Take 100 mg by mouth at bedtime as needed for sleep.     . vitamin B-12 (CYANOCOBALAMIN) 500 MCG tablet Take 500 mcg by mouth daily.    . vitamin C (ASCORBIC ACID) 500 MG tablet Take 500 mg by mouth daily.    . benazepril (LOTENSIN) 40 MG tablet Take 1 tablet (40 mg total) by mouth daily. 90 tablet 3  . benzonatate (TESSALON PERLES) 100 MG capsule Take 1 capsule (100 mg total) by mouth at bedtime as needed for cough. (Patient not taking: Reported on 11/04/2019) 30 capsule 0  . furosemide (LASIX) 40 MG tablet TAKE ONE TABLET BY MOUTH DAILY AS NEEDED (Patient taking differently: Take 40 mg by mouth daily as needed for fluid. ) 90  tablet 5  . hydrochlorothiazide (HYDRODIURIL) 25 MG tablet TAKE 1 TABLET BY MOUTH ONCE DAILY 90 tablet 3  . Insulin Syringe-Needle U-100 31G X 15/64" 0.3 ML MISC Use with Humalog to correct blood sugar before meals three times a day 200 each 5  . ONETOUCH VERIO test strip USE TO TEST BLOOD SUGAR 3-4 TIMES DAILY 100 each 4    No results found. However, due to the size of the patient record, not all encounters were searched. Please check Results Review for a complete set of results. No results found.  Review of Systems  All other systems reviewed and are negative.   There were no vitals taken for this visit. Physical Exam  Patient is alert, oriented, no adenopathy, well-dressed, normal affect, normal respiratory effort. Examination patient has active abduction flexion of 45 degrees of the right shoulder passively I can get her up to about 90 degrees of passive abduction of flexion.  She only has 20 degrees of internal and external rotation.  Patient also has sleep apnea does not use a CPAP machine.  Review of the MRI scan shows a full-thickness tear of the supraspinatus tendon as well as infraspinatus partial-thickness tearing glenohumeral degenerative arthritis as well as atrophy of  the teres minor. Assessment/Plan 1. Nontraumatic complete tear of right rotator cuff     Plan: Recommended proceeding with arthroscopic debridement of the right shoulder rotator cuff tear.  Risk and benefits were discussed including persistent pain and decreased range of motion potential for infection need for additional surgery.  Patient states she understands wished to proceed at this time.   Bevely Palmer Janecia Palau, PA 11/11/2019, 7:00 AM

## 2019-11-11 NOTE — Plan of Care (Signed)
  Problem: Activity: Goal: Risk for activity intolerance will decrease Outcome: Progressing   Problem: Nutrition: Goal: Adequate nutrition will be maintained Outcome: Progressing   Problem: Coping: Goal: Level of anxiety will decrease Outcome: Progressing   Problem: Pain Managment: Goal: General experience of comfort will improve Outcome: Progressing   Problem: Safety: Goal: Ability to remain free from injury will improve Outcome: Progressing   

## 2019-11-11 NOTE — Transfer of Care (Signed)
Immediate Anesthesia Transfer of Care Note  Patient: Jennifer [name]  Procedure(s) Performed: RIGHT SHOULDER ARTHROSCOPY AND DEBRIDEMENT (Right Shoulder)  Patient Location: PACU  Anesthesia Type:GA combined with regional for post-op pain  Level of Consciousness: drowsy  Airway & Oxygen Therapy: Patient Spontanous Breathing and Patient connected to nasal cannula oxygen  Post-op Assessment: Report given to RN, Post -op Vital signs reviewed and stable and Patient moving all extremities  Post vital signs: Reviewed and stable  Last Vitals:  Vitals Value Taken Time  BP 140/68 11/11/19 1059  Temp    Pulse 66 11/11/19 1102  Resp 17 11/11/19 1102  SpO2 96 % 11/11/19 1102  Vitals shown include unvalidated device data.  Last Pain:  Vitals:   11/11/19 0750  TempSrc: Oral  PainSc: 7          Complications: No apparent anesthesia complications

## 2019-11-11 NOTE — Op Note (Signed)
11/11/2019  10:48 AM  PATIENT:  Jennifer [name]    PRE-OPERATIVE DIAGNOSIS:  Right Shoulder Rotator Cuff Tear With impingement  POST-OPERATIVE DIAGNOSIS:  Same  PROCEDURE:  RIGHT SHOULDER ARTHROSCOPY AND DEBRIDEMENT of a SLAP lesion and partial rotator cuff tear with subacromial decompression  SURGEON:  Newt Minion, MD  PHYSICIAN ASSISTANT:None ANESTHESIA:   General  PREOPERATIVE INDICATIONS:  [name] is a  58 y.o. female with a diagnosis of Right Shoulder Rotator Cuff Tear who failed conservative measures and elected for surgical management.    The risks benefits and alternatives were discussed with the patient preoperatively including but not limited to the risks of infection, bleeding, nerve injury, cardiopulmonary complications, the need for revision surgery, among others, and the patient was willing to proceed.  OPERATIVE IMPLANTS: None  @ENCIMAGES @  OPERATIVE FINDINGS: Osteoarthritis of the glenohumeral joint.  OPERATIVE PROCEDURE: Patient was brought the operating room after undergoing a regional block she then underwent a general anesthetic.  After adequate levels anesthesia were obtained patient was placed in the beachchair position the right upper extremity was prepped using DuraPrep draped into a sterile field a timeout was called.  The scope was inserted from the posterior portal and a lateral portal was established.  Patient has significant bursitis and synovitis with a hooked type III acromium in the subacromial space.  The electrical wand and shaver were used for bursectomy.  Patient had partial tearing of the rotator cuff this was debrided with the shaver and the electrical wand.  Patient had a hooked type III acromium and patient underwent subacromial decompression with the acromionizer.  This left a good space in the subacromial space.  There was partial tearing of the rotator cuff that was also debrided.  The electrical wand was used for hemostasis.  The  instruments were removed the scope was then inserted from the posterior portal into the glenohumeral joint and an anterior portal was established an outside in technique with an 18-gauge spinal.  Visualization showed degenerative tearing of the labrum complete tearing of the biceps tendon and degenerative changes of the glenohumeral joint.  The shaver and electrical wand were inserted from the anterior portal and patient underwent debridement of the rotator cuff debridement of the SLAP lesion.  The electrical wand was used for hemostasis.  The instruments were removed the portals were closed using 2-0 nylon.  Sterile dressing was applied patient was placed in a sling extubated taken the PACU in stable condition.   DISCHARGE PLANNING:  Antibiotic duration: 23-hour antibiotic  Weightbearing: Not applicable  Pain medication: Opioid pathway  Dressing care/ Wound VAC: Reinforce dressing as needed  Ambulatory devices: Not applicable  Discharge to: To home in the morning  Follow-up: In the office 1 week post operative.

## 2019-11-11 NOTE — Anesthesia Procedure Notes (Signed)
Anesthesia Regional Block: Interscalene brachial plexus block   Pre-Anesthetic Checklist: ,, timeout performed, Correct Patient, Correct Site, Correct Laterality, Correct Procedure, Correct Position, site marked, Risks and benefits discussed,  Surgical consent,  Pre-op evaluation,  At surgeon's request and post-op pain management  Laterality: Right  Prep: chloraprep       Needles:  Injection technique: Single-shot  Needle Type: Stimulator Needle - 40     Needle Length: 4cm  Needle Gauge: 22     Additional Needles:   Procedures:,,,, ultrasound used (permanent image in chart),,,,  Narrative:  Start time: 11/11/2019 8:43 AM End time: 11/11/2019 8:48 AM Injection made incrementally with aspirations every 5 mL.  Performed by: Personally  Anesthesiologist: Nolon Nations, MD  Additional Notes: BP cuff, EKG monitors applied. Sedation begun. Nerve location verified with U/S. Anesthetic injected incrementally, slowly , and after neg aspirations under direct u/s guidance. Good perineural spread. Tolerated well.

## 2019-11-11 NOTE — Anesthesia Preprocedure Evaluation (Signed)
Anesthesia Evaluation  Patient identified by MRN, date of birth, ID band Patient awake    Reviewed: Allergy & Precautions, NPO status , Patient's Chart, lab work & pertinent test results, reviewed documented beta blocker date and time   History of Anesthesia Complications Negative for: history of anesthetic complications  Airway Mallampati: II  TM Distance: >3 FB Neck ROM: Full    Dental  (+) Dental Advisory Given   Pulmonary shortness of breath, asthma , sleep apnea , pneumonia, resolved, COPD,  COPD inhaler, Current Smoker,    Pulmonary exam normal breath sounds clear to auscultation       Cardiovascular hypertension, Pt. on medications and Pt. 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 Left ventricular ejection fraction, by visual estimation, is 65 to 70%. The left ventricle has normal function. Normal left ventricular size. There is mildly increased left ventricular hypertrophy.  2. Definity contrast agent was given IV to delineate the left ventricular endocardial borders.  3. Left ventricular diastolic Doppler parameters are indeterminate pattern of LV diastolic filling.  4. Poor acoustic windows limit study.  5. Global right ventricle has normal systolic function.The right ventricular size is normal. No increase in right ventricular wall thickness.  6. Left atrial size was normal.  7. Right atrial size was normal.  8. The mitral valve is normal in structure. No evidence of mitral valve regurgitation.  9. The tricuspid valve is not well visualized. Tricuspid valve regurgitation was not assessed by color flow Doppler. 10. The aortic valve is grossly normal Aortic valve regurgitation was not visualized by color flow Doppler. Mild aortic valve sclerosis without stenosis. 11. The  pulmonic valve was grossly normal. Pulmonic valve regurgitation is not visualized by color flow Doppler. 12. The left ventricular function has unchanged. 13. The interatrial septum was not assessed.   Neuro/Psych PSYCHIATRIC DISORDERS Depression negative neurological ROS     GI/Hepatic Neg liver ROS, GERD  Poorly Controlled,  Endo/Other  diabetes, Poorly Controlled, Insulin Dependent, Oral Hypoglycemic AgentsMorbid obesity  Renal/GU Renal InsufficiencyRenal disease     Musculoskeletal  (+) Arthritis , Fibromyalgia -  Abdominal (+) + obese,   Peds  Hematology negative hematology ROS (+)   Anesthesia Other Findings   Reproductive/Obstetrics negative OB ROS                             Anesthesia Physical  Anesthesia Plan  ASA: III  Anesthesia Plan: General   Post-op Pain Management: GA combined w/ Regional for post-op pain   Induction: Intravenous  PONV Risk Score and Plan: 3 and Dexamethasone, Ondansetron and Treatment may vary due to age or medical condition  Airway Management Planned: Oral ETT  Additional Equipment: None  Intra-op Plan:   Post-operative Plan: Extubation in OR  Informed Consent: I have reviewed the patients History and Physical, chart, labs and discussed the procedure including the risks, benefits and alternatives for the proposed anesthesia with the patient or authorized representative who has indicated his/her understanding and acceptance.     Dental advisory given  Plan Discussed with: CRNA and Surgeon  Anesthesia Plan Comments: (PAT note written 03/31/2019 by Myra Gianotti, PA-C. Same day work-up. )        Anesthesia Quick Evaluation

## 2019-11-11 NOTE — Progress Notes (Addendum)
1540 Received pt from PACU, A&O x4. Right shoulder dressing with shadow bloody drainage. Ice pack applied. Numbness to right hand, cannot do a grip yet due to the nerve block. Right arm sling on.

## 2019-11-12 DIAGNOSIS — M75121 Complete rotator cuff tear or rupture of right shoulder, not specified as traumatic: Secondary | ICD-10-CM | POA: Diagnosis not present

## 2019-11-12 LAB — GLUCOSE, CAPILLARY
Glucose-Capillary: 215 mg/dL — ABNORMAL HIGH (ref 70–99)
Glucose-Capillary: 276 mg/dL — ABNORMAL HIGH (ref 70–99)
Glucose-Capillary: 256 mg/dL — ABNORMAL HIGH (ref 70–99)
Glucose-Capillary: 220 mg/dL — ABNORMAL HIGH (ref 70–99)

## 2019-11-12 NOTE — Discharge Summary (Signed)
Discharge Diagnoses:  Active Problems:   Impingement syndrome of right shoulder   Nontraumatic incomplete tear of right rotator cuff   Pain in joint of right shoulder   Surgeries: Procedure(s): RIGHT SHOULDER ARTHROSCOPY AND DEBRIDEMENT on 11/11/2019    Consultants:   Discharged Condition: Improved  Hospital Course: Jennifer [name] is an 58 y.o. female who was admitted 11/11/2019 with a chief complaint of rotator cuff tear with impingement syndrome, with a final diagnosis of Right Shoulder Rotator Cuff Tear.  Patient was brought to the operating room on 11/11/2019 and underwent Procedure(s): RIGHT SHOULDER ARTHROSCOPY AND DEBRIDEMENT.    Patient was given perioperative antibiotics:  Anti-infectives (From admission, onward)   Start     Dose/Rate Route Frequency Ordered Stop   11/11/19 1600  ceFAZolin (ANCEF) IVPB 1 g/50 mL premix     1 g 100 mL/hr over 30 Minutes Intravenous Every 6 hours 11/11/19 1550 11/12/19 0445   11/11/19 0715  ceFAZolin (ANCEF) IVPB 2g/100 mL premix     2 g 200 mL/hr over 30 Minutes Intravenous On call to O.R. 11/11/19 [id number] 11/11/19 0940    .  Patient was given sequential compression devices, early ambulation, and aspirin for DVT prophylaxis.  Recent vital signs:  Patient Vitals for the past 24 hrs:  BP Temp Temp src Pulse Resp SpO2  11/12/19 0820 (!) 145/66 98.4 F (36.9 C) Oral 71 -- 90 %  11/12/19 0341 (!) 139/58 98.2 F (36.8 C) Oral 76 -- 94 %  11/11/19 2008 (!) 153/80 (!) 97.5 F (36.4 C) Oral 74 -- 92 %  11/11/19 1549 (!) 163/70 (!) 97.3 F (36.3 C) Oral 61 -- 94 %  11/11/19 1529 (!) 162/74 (!) 97 F (36.1 C) -- 66 (!) 23 95 %  11/11/19 1500 (!) 151/80 -- -- 64 13 94 %  11/11/19 1435 (!) 155/60 -- -- 64 20 95 %  11/11/19 1415 (!) 182/84 -- -- 65 18 92 %  11/11/19 1330 (!) 154/75 -- -- 60 18 91 %  11/11/19 1300 (!) 160/69 -- -- 63 13 91 %  11/11/19 1230 (!) 156/68 -- -- (!) 58 12 94 %  11/11/19 1215 (!) 153/66 -- -- (!) 58 13 93 %   11/11/19 1200 (!) 153/73 -- -- 64 13 95 %  11/11/19 1145 (!) 153/71 -- -- 63 10 92 %  11/11/19 1130 138/79 -- -- 67 (!) 27 95 %  11/11/19 1115 140/76 -- -- 67 (!) 22 91 %  11/11/19 1100 140/68 -- -- 65 11 95 %  .  Recent laboratory studies: No results found.  Discharge Medications:   Allergies as of 11/12/2019      Reactions   Iohexol Other (See Comments)    Desc: IV CONTRAST CAUSE NEPHROPATHY IN 2007   Ivp Dye [iodinated Diagnostic Agents] Other (See Comments)   Desc: IV CONTRAST CAUSE NEPHROPATHY IN 2007   Abilify [aripiprazole] Other (See Comments)   Urinary freq Nov 2016   Morphine Sulfate Itching, Rash      Medication List    TAKE these medications   albuterol (2.5 MG/3ML) 0.083% nebulizer solution Commonly known as: PROVENTIL INHALE THE CONTENTS OF 1 VIAL VIA NEBULIZER EVERY 6 HOURS AS NEEDED FOR WHEEZING What changed: See the new instructions.   ProAir HFA 108 (90 Base) MCG/ACT inhaler Generic drug: albuterol INHALE 2 PUFFS BY MOUTH EVERY 6 HOURS AS NEEDED FOR WHEEZING What changed: See the new instructions.   amLODipine 10 MG tablet Commonly known as: NORVASC  Take 1 tablet (10 mg total) by mouth daily.   Anoro Ellipta 62.5-25 MCG/INH Aepb Generic drug: umeclidinium-vilanterol TAKE 1 PUFF BY MOUTH EVERY DAY What changed: See the new instructions.   aspirin EC 81 MG tablet Take 81 mg by mouth daily.   atenolol 100 MG tablet Commonly known as: TENORMIN TAKE 1 TABLET BY MOUTH EVERY DAY   baclofen 10 MG tablet Commonly known as: LIORESAL TAKE 1 TABLET BY MOUTH THREE TIMES A DAY AS NEEDED FOR MUSCLE SPASMS What changed: See the new instructions.   benazepril 40 MG tablet Commonly known as: LOTENSIN Take 1 tablet (40 mg total) by mouth daily.   benzonatate 100 MG capsule Commonly known as: Tessalon Perles Take 1 capsule (100 mg total) by mouth at bedtime as needed for cough.   betamethasone dipropionate 0.05 % cream Apply 1 application topically 2  (two) times daily as needed (irritation).   Biotin 5 MG Tabs Take 5 mg by mouth daily.   buPROPion 300 MG 24 hr tablet Commonly known as: WELLBUTRIN XL TAKE 1 TABLET BY MOUTH DAILY   cholecalciferol 25 MCG (1000 UT) tablet Commonly known as: VITAMIN D3 Take 1,000 Units by mouth daily.   Fish Oil 1000 MG Caps Take 1,000 mg by mouth daily.   fluticasone 50 MCG/ACT nasal spray Commonly known as: FLONASE INSTILL 1 SPRAY IN EACH NOSTRIL DAILY What changed:   how much to take  how to take this  when to take this  reasons to take this  additional instructions   furosemide 40 MG tablet Commonly known as: LASIX TAKE ONE TABLET BY MOUTH DAILY AS NEEDED What changed: reasons to take this   hydrochlorothiazide 25 MG tablet Commonly known as: HYDRODIURIL TAKE 1 TABLET BY MOUTH ONCE DAILY   hydrOXYzine 25 MG capsule Commonly known as: VISTARIL Take 2 capsules (50 mg total) by mouth daily.   insulin lispro 100 UNIT/ML KwikPen Commonly known as: HumaLOG KwikPen If blood sugar is < 175 Do not take any correction insulin. 176-225 inject 2 units. 226-275 inject 4 units. [id number] inject 6 units. 326-375 inject 8 units. 376-425 inject 10 units. 426-475 inject 12 units. [id number] inject 14 units and call office for further instructions. What changed:   how much to take  how to take this  when to take this  additional instructions   Insulin Pen Needle 32G X 4 MM Misc Use to inject insulin 4 times a day and semaglutide once weekly   Insulin Syringe-Needle U-100 31G X 15/64" 0.3 ML Misc Use with Humalog to correct blood sugar before meals three times a day   ipratropium 0.02 % nebulizer solution Commonly known as: ATROVENT USE 1 VIAL VIA NEBULIZER EVERY 6 HOURS AS NEEDED FOR WHEEZING   metFORMIN 500 MG 24 hr tablet Commonly known as: GLUCOPHAGE-XR TAKE 1 TABLET BY MOUTH EVERY DAY WITH BREAKFAST What changed: See the new instructions.   multivitamin with minerals Tabs  tablet Take 1 tablet by mouth daily.   Myrbetriq 50 MG Tb24 tablet Generic drug: mirabegron ER TAKE 1 TABLET BY MOUTH DAILY   nystatin powder Commonly known as: nystatin APPLY TO AFFECTED AREA THREE TIMES A DAY What changed:   how much to take  how to take this  when to take this   omeprazole 20 MG capsule Commonly known as: PRILOSEC TAKE 1 CAPSULE (20 MG TOTAL) BY MOUTH 2 (TWO) TIMES DAILY BEFORE A MEAL.   OneTouch Verio test strip Generic drug: glucose blood USE TO TEST  BLOOD SUGAR 3-4 TIMES DAILY   oxyCODONE-acetaminophen 5-325 MG tablet Commonly known as: Percocet Take 1 tablet by mouth every 4 (four) hours as needed for severe pain. What changed:   how much to take  how to take this  when to take this  reasons to take this  additional instructions   Ozempic (0.25 or 0.5 MG/DOSE) 2 MG/1.5ML Sopn Generic drug: Semaglutide(0.25 or 0.5MG /DOS) INJECT 0.5 MG INTO THE SKIN ONCE A WEEK.   pregabalin 200 MG capsule Commonly known as: LYRICA TAKE 1 CAPSULE BY MOUTH THREE TIMES A DAY What changed: See the new instructions.   rosuvastatin 20 MG tablet Commonly known as: CRESTOR TAKE 1 TABLET BY MOUTH AT BEDTIME What changed: when to take this   traZODone 100 MG tablet Commonly known as: DESYREL Take 100 mg by mouth at bedtime as needed for sleep.   [name] FlexTouch 200 UNIT/ML Sopn Generic drug: Insulin Degludec Inject 70 Units into the skin daily.   vitamin B-12 500 MCG tablet Commonly known as: CYANOCOBALAMIN Take 500 mcg by mouth daily.   vitamin C 500 MG tablet Commonly known as: ASCORBIC ACID Take 500 mg by mouth daily.       Diagnostic Studies: Dg Chest Port 1 View  Result Date: 10/17/2019 CLINICAL DATA:  58 year old female with fever and cough. EXAM: PORTABLE CHEST 1 VIEW COMPARISON:  Chest radiograph dated 05/11/2019 FINDINGS: There is mild cardiomegaly with mild vascular congestion. Bibasilar densities, right greater left concerning for  infiltrate. Clinical correlation and follow-up to resolution recommended. There is no large pleural effusion. No pneumothorax. Atherosclerotic calcification of the aorta. No acute osseous pathology. IMPRESSION: 1. Mild cardiomegaly with mild vascular congestion. 2. Bibasilar densities, right greater left concerning for infiltrate. Clinical correlation and follow-up to resolution recommended. Electronically Signed   By: Anner Crete M.D.   On: 10/17/2019 00:49    Patient benefited maximally from their hospital stay and there were no complications.     Disposition: Discharge disposition: 01-Home or Self Care      Discharge Instructions    Call MD / Call 911   Complete by: As directed    If you experience chest pain or shortness of breath, CALL 911 and be transported to the hospital emergency room.  If you develope a fever above 101 F, pus (white drainage) or increased drainage or redness at the wound, or calf pain, call your surgeon's office.   Call MD / Call 911   Complete by: As directed    If you experience chest pain or shortness of breath, CALL 911 and be transported to the hospital emergency room.  If you develope a fever above 101 F, pus (white drainage) or increased drainage or redness at the wound, or calf pain, call your surgeon's office.   Constipation Prevention   Complete by: As directed    Drink plenty of fluids.  Prune juice may be helpful.  You may use a stool softener, such as Colace (over the counter) 100 mg twice a day.  Use MiraLax (over the counter) for constipation as needed.   Constipation Prevention   Complete by: As directed    Drink plenty of fluids.  Prune juice may be helpful.  You may use a stool softener, such as Colace (over the counter) 100 mg twice a day.  Use MiraLax (over the counter) for constipation as needed.   Diet - low sodium heart healthy   Complete by: As directed    Diet - low sodium heart  healthy   Complete by: As directed    Discharge  instructions   Complete by: As directed    Keep dressing clean and dry. Follow up 1 week Dr. Sharol Given   Increase activity slowly as tolerated   Complete by: As directed    Increase activity slowly as tolerated   Complete by: As directed      Follow-up Information    Newt Minion, MD Follow up in 1 week(s).   Specialty: Orthopedic Surgery Contact information: [address] [place] [address] [phone]            Signed: Newt Minion 11/12/2019, 8:41 AM

## 2019-11-12 NOTE — Evaluation (Signed)
Physical Therapy Evaluation Patient Details Name: Jennifer [name] MRN: [id number] DOB: Aug 20, 1961 Today's Date: 11/12/2019   History of Present Illness  58 y.o. female who was admitted 11/11/2019 with a chief complaint of rotator cuff tear with impingement syndrome, with a final diagnosis of Right Shoulder Rotator Cuff Tear.  Patient was brought to the operating room on 11/11/2019 and underwent RIGHT SHOULDER ARTHROSCOPY AND DEBRIDEMENT, PH significant for CHF, arthritis, cataract, chronic pain, COPD, DM II, L BKA.  Clinical Impression  Pt demonstrates deficits in power, strength, balance, endurance, gait, functional mobility. Pt with great concern for stair negotiation as she has to depend on BUE support to safely negotiate stairs at home. Pt is able to transfer and ambulate short distances with limited physical assistance at this time. Pt will continue to benefit from PT POC to perform stair negotiation training and wheelchair mobility while maintaining NWB RUE, in order to restore independent mobility.    Follow Up Recommendations SNF;Supervision/Assistance - 24 hour    Equipment Recommendations  (pt already owns necessary DME)    Recommendations for Other Services       Precautions / Restrictions Precautions Precautions: Fall Required Braces or Orthoses: Sling Restrictions Weight Bearing Restrictions: Yes RUE Weight Bearing: Non weight bearing      Mobility  Bed Mobility Overal bed mobility: (pt received up in recliner)                Transfers Overall transfer level: Needs assistance Equipment used: None Transfers: Sit to/from Stand Sit to Stand: Min guard            Ambulation/Gait Ambulation/Gait assistance: Min guard Gait Distance (Feet): 20 Feet Assistive device: (support of bedrail) Gait Pattern/deviations: Step-to pattern;Wide base of support;Trunk flexed Gait velocity: reduced Gait velocity interpretation: <1.31 ft/sec, indicative of household  ambulator General Gait Details: pt with short step length with reduced foot clearance and widened BOS. Pt bearing weight through LUE on bed rail to improve balance  Stairs            Wheelchair Mobility    Modified Rankin (Stroke Patients Only)       Balance Overall balance assessment: Needs assistance Sitting-balance support: No upper extremity supported;Feet supported Sitting balance-Leahy Scale: Good Sitting balance - Comments: supervision   Standing balance support: Single extremity supported Standing balance-Leahy Scale: Fair Standing balance comment: minG-close supervision                             Pertinent Vitals/Pain Pain Assessment: Faces Faces Pain Scale: Hurts little more Pain Location: RUE Pain Descriptors / Indicators: Aching;Constant Pain Intervention(s): Limited activity within patient's tolerance    Home Living Family/patient expects to be discharged to:: Private residence Living Arrangements: Alone Available Help at Discharge: Family;Available PRN/intermittently Type of Home: House Home Access: Stairs to enter Entrance Stairs-Rails: Right;Left(not sturdy per pt) Entrance Stairs-Number of Steps: 5 Home Layout: One level Home Equipment: Shower seat;Tub bench;Wheelchair - Education administrator (comment);Walker - 4 wheels      Prior Function Level of Independence: Independent with assistive device(s)   Gait / Transfers Assistance Needed: short household distances with RW and prosthesis, utilizes wheelchair for most household distances however this cannot fit through most doorways           Hand Dominance   Dominant Hand: Right    Extremity/Trunk Assessment   Upper Extremity Assessment Upper Extremity Assessment: Defer to OT evaluation    Lower Extremity Assessment Lower Extremity  Assessment: LLE deficits/detail LLE Deficits / Details: 4-/5 knee extension and hip flexion, L BKA    Cervical / Trunk Assessment Cervical / Trunk  Assessment: Normal  Communication   Communication: No difficulties  Cognition Arousal/Alertness: Awake/alert Behavior During Therapy: WFL for tasks assessed/performed Overall Cognitive Status: Within Functional Limits for tasks assessed                                        General Comments General comments (skin integrity, edema, etc.): VSS    Exercises     Assessment/Plan    PT Assessment Patient needs continued PT services  PT Problem List Decreased strength;Decreased balance;Decreased mobility;Pain       PT Treatment Interventions DME instruction;Gait training;Stair training;Functional mobility training;Therapeutic activities;Therapeutic exercise;Balance training;Neuromuscular re-education;Patient/family education    PT Goals (Current goals can be found in the Care Plan section)  Acute Rehab PT Goals Patient Stated Goal: to return to independence PT Goal Formulation: With patient Time For Goal Achievement: 11/26/19 Potential to Achieve Goals: Good Additional Goals Additional Goal #1: Pt will mobilize in manual wheelchair for >100 feet utilizing LUE and BLE to improve mobility in the community.    Frequency Min 2X/week   Barriers to discharge        Co-evaluation               AM-PAC PT "6 Clicks" Mobility  Outcome Measure Help needed turning from your back to your side while in a flat bed without using bedrails?: A Little Help needed moving from lying on your back to sitting on the side of a flat bed without using bedrails?: A Little Help needed moving to and from a bed to a chair (including a wheelchair)?: A Little Help needed standing up from a chair using your arms (e.g., wheelchair or bedside chair)?: A Little Help needed to walk in hospital room?: A Little Help needed climbing 3-5 steps with a railing? : A Lot 6 Click Score: 17    End of Session Equipment Utilized During Treatment: (none) Activity Tolerance: Patient tolerated  treatment well Patient left: in chair;with call bell/phone within reach Nurse Communication: Mobility status PT Visit Diagnosis: Other abnormalities of gait and mobility (R26.89)    Time: [id number] PT Time Calculation (min) (ACUTE ONLY): 16 min   Charges:   PT Evaluation $PT Eval Low Complexity: Mason, PT, DPT Acute Rehabilitation Pager: [phone]   Zenaida Niece 11/12/2019, 3:07 PM

## 2019-11-12 NOTE — Progress Notes (Signed)
1200 Pt refused to go home today, claiming that she has no help at home. She prefers to go to rehab if she can. Dr Sharol Given notified, discharge to home was cancelled and changed to be discharged to SNF.

## 2019-11-12 NOTE — Plan of Care (Signed)
  Problem: Clinical Measurements: Goal: Will remain free from infection Outcome: Progressing   Problem: Activity: Goal: Risk for activity intolerance will decrease Outcome: Progressing   Problem: Coping: Goal: Level of anxiety will decrease Outcome: Progressing   Problem: Pain Managment: Goal: General experience of comfort will improve Outcome: Progressing   Problem: Safety: Goal: Ability to remain free from injury will improve Outcome: Progressing   Problem: Skin Integrity: Goal: Risk for impaired skin integrity will decrease Outcome: Progressing

## 2019-11-12 NOTE — Plan of Care (Signed)
  Problem: Education: Goal: Knowledge of General Education information will improve Description: Including pain rating scale, medication(s)/side effects and non-pharmacologic comfort measures Outcome: Not Progressing   Problem: Health Behavior/Discharge Planning: Goal: Ability to manage health-related needs will improve Outcome: Not Progressing   Problem: Clinical Measurements: Goal: Ability to maintain clinical measurements within normal limits will improve Outcome: Not Progressing   

## 2019-11-12 NOTE — Evaluation (Signed)
Occupational Therapy Evaluation Patient Details Name: Jennifer [name] MRN: [id number] DOB: 05/26/1961 Today's Date: 11/12/2019    History of Present Illness Pt is a 58 y.o. female who s/p RIGHT SHOULDER ARTHROSCOPY AND DEBRIDEMENT of a SLAP lesion and partial rotator cuff tear with subacromial decompression with PMH: COPD exacerbation likely secondary to bacterial PNA. 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   Clinical Impression   Pt admitted with RIGHT SHOULDER ARTHROSCOPY AND DEBRIDEMENT of a SLAP lesion and partial rotator cuff tear with subacromial decompression . Pt currently with functional limitations due to the deficits listed below (see OT Problem List). Pt at this time wit HOB elevated and bed rails from supine to sitting with min guard. Pt then requires moderate assist with donning prosthetic to prepare for transfer to wheelchair. Pt at this time is reporting she has dogs in the home, 4-5 steps to enter  with a railing which is falling apart and no family support as not always there to assist. Pt was using RW in home as wheelchair will not fit into all rooms of the house. Pt will benefit from skilled OT to increase their safety and independence with ADL and functional mobility for ADL to facilitate discharge to venue listed below.       Follow Up Recommendations  SNF;Supervision/Assistance - 24 hour(however if they return home to have [facility] with 24 hour care)    Equipment Recommendations       Recommendations for Other Services PT consult     Precautions / Restrictions Precautions Required Braces or Orthoses: Sling Restrictions Weight Bearing Restrictions: Yes RUE Weight Bearing: Non weight bearing      Mobility Bed Mobility Overal bed mobility: Needs Assistance Bed Mobility: Supine to Sit     Supine to sit: HOB elevated;Supervision        Transfers Overall transfer level: Needs assistance   Transfers: Stand Pivot Transfers   Stand pivot  transfers: Min guard;From elevated surface       General transfer comment: pt requires surface infront of pt and set up of all materials to chair/wheelchair    Balance Overall balance assessment: Needs assistance Sitting-balance support: Feet supported Sitting balance-Leahy Scale: Fair                                     ADL either performed or assessed with clinical judgement   ADL Overall ADL's : Needs assistance/impaired Eating/Feeding: Sitting;Independent   Grooming: Wash/dry hands;Wash/dry face;Set up;Sitting   Upper Body Bathing: Minimal assistance;Sitting   Lower Body Bathing: Moderate assistance;Cueing for safety;Cueing for sequencing;Sit to/from stand   Upper Body Dressing : Minimal assistance;Cueing for safety;Cueing for sequencing;Sitting   Lower Body Dressing: Maximal assistance;Cueing for safety;Cueing for sequencing;Sit to/from stand   Toilet Transfer: Minimal Office manager and Hygiene: Moderate assistance;Cueing for safety;Cueing for sequencing;Sit to/from stand   Tub/ Banker: Moderate assistance;Tub bench   Functional mobility during ADLs: (complete wheelchair level as due to recent RUE sx)       Vision Patient Visual Report: No change from baseline Vision Assessment?: No apparent visual deficits     Perception Perception Perception Tested?: No   Praxis Praxis Praxis tested?: Not tested    Pertinent Vitals/Pain Pain Assessment: 0-10 Pain Score: 7  Pain Descriptors / Indicators: Aching;Constant Pain Intervention(s): Limited activity within patient's tolerance;Monitored during session;Repositioned     Hand Dominance  Right   Extremity/Trunk Assessment Upper Extremity Assessment Upper Extremity Assessment: RUE deficits/detail RUE Deficits / Details: recent sx RUE: Unable to fully assess due to pain;Unable to fully assess due to immobilization RUE Sensation: decreased light  touch RUE Coordination: decreased fine motor;decreased gross motor   Lower Extremity Assessment Lower Extremity Assessment: LLE deficits/detail LLE Deficits / Details: L BKA       Communication Communication Communication: No difficulties   Cognition Arousal/Alertness: Awake/alert Behavior During Therapy: WFL for tasks assessed/performed Overall Cognitive Status: Within Functional Limits for tasks assessed                                     General Comments       Exercises     Shoulder Instructions      Home Living Family/patient expects to be discharged to:: Private residence Living Arrangements: Alone   Type of Home: House Home Access: Stairs to enter [employer] of Steps: 5 Entrance Stairs-Rails: Right;Left(mot sturdy per pt) Home Layout: One level     Bathroom Shower/Tub: Teacher, early years/pre: Standard Bathroom Accessibility: Yes How Accessible: Accessible via walker Home Equipment: Shower seat;Tub bench;Wheelchair - Education administrator (comment);Walker - 4 wheels(reacher )          Prior Functioning/Environment Level of Independence: Independent  Gait / Transfers Assistance Needed: Short, household distances and steps into home with RW and LLE prosthesis; primarily uses w/c for mobility. Sits on tub bench for "the occasional shower." ADL's / Homemaking Assistance Needed: Assist for household tasks; uses reacher for washing backside            OT Problem List: Decreased strength;Decreased range of motion;Decreased activity tolerance;Impaired balance (sitting and/or standing);Decreased safety awareness;Pain;Decreased knowledge of precautions      OT Treatment/Interventions: Self-care/ADL training;Therapeutic exercise;Manual therapy;Therapeutic activities;Patient/family education;Balance training    OT Goals(Current goals can be found in the care plan section) Acute Rehab OT Goals Patient Stated Goal: to figure out to  take care if myself OT Goal Formulation: With patient Time For Goal Achievement: 11/19/19 Potential to Achieve Goals: Good ADL Goals Pt Will Perform Upper Body Bathing: with set-up;sitting Pt Will Perform Lower Body Bathing: with min guard assist;sit to/from stand Pt Will Transfer to Toilet: with min guard assist;bedside commode Pt Will Perform Tub/Shower Transfer: with min assist;tub bench  OT Frequency: Min 2X/week   Barriers to D/C: Decreased caregiver support          Co-evaluation              AM-PAC OT "6 Clicks" Daily Activity     Outcome Measure Help from another person eating meals?: A Little Help from another person taking care of personal grooming?: A Lot Help from another person toileting, which includes using toliet, bedpan, or urinal?: A Lot Help from another person bathing (including washing, rinsing, drying)?: A Lot Help from another person to put on and taking off regular upper body clothing?: A Little Help from another person to put on and taking off regular lower body clothing?: A Lot 6 Click Score: 14   End of Session Nurse Communication: Mobility status;Other (comment)(needs PT orders and needs rehab prior to home)  Activity Tolerance: Patient tolerated treatment well Patient left: in chair;with chair alarm set  OT Visit Diagnosis: Unsteadiness on feet (R26.81);Other abnormalities of gait and mobility (R26.89);Repeated falls (R29.6);Pain Pain - Right/Left: Right Pain - part of body: Shoulder  Time: [id number] OT Time Calculation (min): 69 min Charges:  OT General Charges $OT Visit: 1 Visit OT Evaluation $OT Eval Low Complexity: 1 Low OT Treatments $Self Care/Home Management : 53-67 mins  Joeseph Amor OTR/L  Acute Rehab Services  [phone] office number [phone] pager number   Joeseph Amor 11/12/2019, 11:47 AM

## 2019-11-13 ENCOUNTER — Encounter (HOSPITAL_COMMUNITY): Payer: Self-pay | Admitting: *Deleted

## 2019-11-13 DIAGNOSIS — M75121 Complete rotator cuff tear or rupture of right shoulder, not specified as traumatic: Secondary | ICD-10-CM | POA: Diagnosis not present

## 2019-11-13 LAB — GLUCOSE, CAPILLARY
Glucose-Capillary: 150 mg/dL — ABNORMAL HIGH (ref 70–99)
Glucose-Capillary: 224 mg/dL — ABNORMAL HIGH (ref 70–99)
Glucose-Capillary: 293 mg/dL — ABNORMAL HIGH (ref 70–99)
Glucose-Capillary: 274 mg/dL — ABNORMAL HIGH (ref 70–99)

## 2019-11-13 MED ORDER — DIPHENHYDRAMINE HCL 25 MG PO CAPS
25.0000 mg | ORAL_CAPSULE | Freq: Four times a day (QID) | ORAL | Status: AC | PRN
  Administered 2019-11-13: 25 mg via ORAL
  Filled 2019-11-13: qty 1

## 2019-11-13 MED ORDER — DIPHENHYDRAMINE HCL 25 MG PO CAPS: 25.0000 mg | ORAL_CAPSULE | Freq: Four times a day (QID) | ORAL | 0 refills | Status: DC | PRN

## 2019-11-13 NOTE — TOC Initial Note (Signed)
Transition of Care [facility]) - Initial/Assessment Note    Patient Details  Name: Jennifer [name] MRN: [id number] Date of Birth: May 10, 1961  Transition of [facility]) CM/SW Contact:    Claudie Leach, RN Phone Number: [phone] 11/13/2019, 8:47 AM  Clinical Narrative:                 Patient discussed d/c plan with CSW, Lattie Haw, and CSW advised me that patient is unsure if able to go home and wanted to know more about home health services.  Patient states she is not able to get in and out of her house bc there are stairs and she does not have help at home.  Patient states there are family members around and one son lives with her but they are unavailable to assist her.  Due to her BKA, her shoulder surgery limits her mobility and functioning.  Patient agreeable to go home via [place], but first choice is to go to SNF.  Advised patient and CSW that insurance may not approve SNF stay for shoulder surgery.  Also, patient states she stayed at [facility] for around 30 days, so she may be into copay days and states she will not be able to afford.    Discussed with RN, who states Dr. Sharol Given is aware and wants patient to stay until tomorrow.    Insurance authorization will take 1-2 days and may not be approved. We will reassess tomorrow for family situation and if patient is able to return home.   Expected Discharge Plan: [place] Barriers to Discharge: Unsafe home situation   Patient Goals and CMS Choice        Expected Discharge Plan and Services Expected Discharge Plan: [place]   Discharge Planning Services: CM Consult     Expected Discharge Date: 11/12/19                                    Prior Living Arrangements/Services   Lives with:: Adult Children Patient language and need for interpreter reviewed:: No Do you feel safe going back to the place where you live?: No   Patient states she does not have help at home.  Need for  Family Participation in Patient Care: Yes (Comment) Care giver support system in place?: No (comment) Current home services: DME Criminal Activity/Legal Involvement Pertinent to Current Situation/Hospitalization: No - Comment as needed  Activities of Daily Living Home Assistive Devices/Equipment: Eyeglasses, Wheelchair, Other (Comment), Bedside commode/3-in-1, Shower chair with back(PROSTETIC LEG) ADL Screening (condition at time of admission) Patient's cognitive ability adequate to safely complete daily activities?: Yes Is the patient deaf or have difficulty hearing?: No Does the patient have difficulty seeing, even when wearing glasses/contacts?: No Does the patient have difficulty concentrating, remembering, or making decisions?: No Patient able to express need for assistance with ADLs?: Yes Does the patient have difficulty dressing or bathing?: No Independently performs ADLs?: Yes (appropriate for developmental age) Does the patient have difficulty walking or climbing stairs?: Yes Weakness of Legs: Left Weakness of Arms/Hands: None  Permission Sought/Granted                  Emotional Assessment Appearance:: Appears stated age Attitude/Demeanor/Rapport: Apprehensive, Engaged Affect (typically observed): Anxious Orientation: : Oriented to Self, Oriented to Place, Oriented to  Time, Oriented to Situation Alcohol / Substance Use: Not Applicable Psych Involvement: No (comment)  Admission diagnosis:  Right Shoulder Rotator Cuff Tear Patient Active Problem List   Diagnosis Date Noted  . Pain in joint of right shoulder 11/11/2019  . Nontraumatic incomplete tear of right rotator cuff   . CKD (chronic kidney disease) stage 3, GFR 30-59 ml/min 10/18/2019  . 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 Skin rash 06/30/2019  . Impingement syndrome of right shoulder 06/22/2019  . Urinary incontinence 05/13/2018  . OSA (obstructive sleep apnea) 06/12/2017  . Morbid obesity with BMI of  40.0-44.9, adult ([place]) 03/26/2017  . Toe amputation status, right 01/16/2017  . Thrombocytopenia ([place]) 06/05/2016  . 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 Type 2 diabetes with nephropathy ([place]) 09/07/2015  . Uncontrolled diabetes mellitus with retinopathy, due to underlying condition, without macular edema ([place]) 09/05/2015  . Diabetic retinopathy ([place]) 09/05/2015  . Counseling regarding end of life decision making 06/14/2015  . Atherosclerosis of aorta ([place]) 04/04/2015  . Anemia 10/05/2014  . Chronic diastolic heart failure ([place])   . Hx of BKA, left ([place])   . Tobacco abuse   . Severe obesity (BMI >= 40) ([place]) 03/02/2013  . Abnormality of gait 03/01/2013  . Healthcare maintenance 07/10/2012  . Chronic prescription opiate use 12/03/2011  . Secondary diabetes mellitus with peripheral vascular disease ([place]) 08/27/2011  . 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 Hypertension associated with diabetes ([place]) 11/29/2009  . Chronic insomnia 10/25/2009  . GASTROESOPHAGEAL REFLUX DISEASE 11/24/2008  . Depression, major, severe recurrence ([place]) 04/06/2008  . DM (diabetes mellitus) type II uncontrolled, periph vascular disorder ([place]) 04/02/2007  . Hyperlipidemia associated with type 2 diabetes mellitus ([place]) 01/08/2007  . COPD exacerbation ([place]) 01/08/2007   PCP:  Bartholomew Crews, MD Pharmacy:   [id number] - [place], [place] [address] Phone: [phone] Fax: [phone]  [place], [place] [id number] [place] [id number] [place] [address] Phone: [phone] Fax: [phone]     Social Determinants of Health (SDOH) Interventions    Readmission Risk Interventions No flowsheet data found.

## 2019-11-13 NOTE — NC FL2 (Signed)
[place] LEVEL OF CARE SCREENING TOOL     IDENTIFICATION  Patient Name: Jennifer [name] Birthdate: October 09, 1961 Sex: female Admission Date (Current Location): 11/11/2019  [facility] and [place] Number:  Herbalist and Address:  [facility], [place] [address], [place], [place] [address]      Provider Number:    Attending Physician Name and Address:  Newt Minion, MD  Relative Name and Phone Number:  Elberta Fortis [place]: Hospital Recommended Level of Care: [place] Prior Approval Number:    Date Approved/Denied:   PASRR Number: [id number]  Discharge Plan: SNF    Current Diagnoses: Patient Active Problem List   Diagnosis Date Noted  . Pain in joint of right shoulder 11/11/2019  . Nontraumatic incomplete tear of right rotator cuff   . CKD (chronic kidney disease) stage 3, GFR 30-59 ml/min 10/18/2019  . 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 Skin rash 06/30/2019  . Impingement syndrome of right shoulder 06/22/2019  . Urinary incontinence 05/13/2018  . OSA (obstructive sleep apnea) 06/12/2017  . Morbid obesity with BMI of 40.0-44.9, adult ([place]) 03/26/2017  . Toe amputation status, right 01/16/2017  . Thrombocytopenia ([place]) 06/05/2016  . 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 Type 2 diabetes with nephropathy ([place]) 09/07/2015  . Uncontrolled diabetes mellitus with retinopathy, due to underlying condition, without macular edema ([place]) 09/05/2015  . Diabetic retinopathy ([place]) 09/05/2015  . Counseling regarding end of life decision making 06/14/2015  . Atherosclerosis of aorta ([place]) 04/04/2015  . Anemia 10/05/2014  . Chronic diastolic heart failure ([place])   . Hx of BKA, left ([place])   . Tobacco abuse   . Severe obesity (BMI >= 40) ([place]) 03/02/2013  . Abnormality of gait 03/01/2013  . Healthcare maintenance 07/10/2012  . Chronic prescription opiate use 12/03/2011  . Secondary diabetes mellitus with peripheral  vascular disease ([place]) 08/27/2011  . 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 Hypertension associated with diabetes ([place]) 11/29/2009  . Chronic insomnia 10/25/2009  . GASTROESOPHAGEAL REFLUX DISEASE 11/24/2008  . Depression, major, severe recurrence ([place]) 04/06/2008  . DM (diabetes mellitus) type II uncontrolled, periph vascular disorder ([place]) 04/02/2007  . Hyperlipidemia associated with type 2 diabetes mellitus ([place]) 01/08/2007  . COPD exacerbation ([place]) 01/08/2007    Orientation RESPIRATION BLADDER Height & Weight     Self, Time, Situation, Place  Normal Incontinent, External catheter Weight: 253 lb 12.8 oz (115.1 kg) Height:  5\' 7"  (170.2 cm)  BEHAVIORAL SYMPTOMS/MOOD NEUROLOGICAL BOWEL NUTRITION STATUS      Continent    AMBULATORY STATUS COMMUNICATION OF NEEDS Skin   Limited Assist Verbally Surgical wounds(right shoulder, skin dry)                       Personal Care Assistance Level of Assistance  Bathing, Feeding, Dressing Bathing Assistance: Independent Feeding assistance: Independent Dressing Assistance: Limited assistance     Functional Limitations Info  Sight, Hearing, Speech Sight Info: Adequate Hearing Info: Adequate Speech Info: Adequate    SPECIAL CARE FACTORS FREQUENCY  PT (By licensed PT), OT (By licensed OT)     PT Frequency: 5x per week OT Frequency: 5x per week            Contractures Contractures Info: Not present    Additional Factors Info  Code Status, Isolation Precautions, Allergies, Insulin Sliding Scale Code Status Info: Full Allergies Info: Iohexol, Ivp Dye ,Iodinated  Diagnostic Agents,, Abilify ,Morphine Sulfate   Insulin Sliding Scale Info: insulin aspart (novoLOG) injection 0-15 Units Isolation Precautions Info: VRE     Current Medications (11/13/2019):  This is the current hospital active medication list Current Facility-Administered Medications  Medication Dose Route Frequency Provider Last Rate Last  Dose  . acetaminophen (TYLENOL) tablet 325-650 mg  325-650 mg Oral Q6H PRN Persons, Bevely Palmer, PA   650 mg at 11/13/19 0730  . albuterol (PROVENTIL) (2.5 MG/3ML) 0.083% nebulizer solution 2.5 mg  2.5 mg Nebulization Q6H PRN Persons, Bevely Palmer, PA      . amLODipine (NORVASC) tablet 10 mg  10 mg Oral Daily Persons, Bevely Palmer, [place]   10 mg at 11/13/19 [id number]  . aspirin EC tablet 81 mg  81 mg Oral Daily Persons, Bevely Palmer, [place]   81 mg at 11/13/19 [id number]  . atenolol (TENORMIN) tablet 100 mg  100 mg Oral Daily Persons, Bevely Palmer, PA   100 mg at 11/13/19 0834  . baclofen (LIORESAL) tablet 10 mg  10 mg Oral TID PRN Persons, Bevely Palmer, PA      . benazepril (LOTENSIN) tablet 40 mg  40 mg Oral Daily Persons, Bevely Palmer, [place]   40 mg at 11/13/19 0835  . buPROPion (WELLBUTRIN XL) 24 hr tablet 300 mg  300 mg Oral Daily Persons, Bevely Palmer, PA   300 mg at 11/13/19 0836  . cholecalciferol (VITAMIN D3) tablet 1,000 Units  1,000 Units Oral Daily Persons, Bevely Palmer, [place]   1,000 Units at 11/13/19 [phone]  . diphenhydrAMINE (BENADRYL) capsule 25 mg  25 mg Oral Q6H PRN Jessy Oto, MD   25 mg at 11/13/19 1148  . docusate sodium (COLACE) capsule 100 mg  100 mg Oral BID Persons, Bevely Palmer, PA   100 mg at 11/13/19 [id number]  . fluticasone (FLONASE) 50 MCG/ACT nasal spray 1 spray  1 spray Each Nare Daily PRN Persons, Bevely Palmer, PA      . furosemide (LASIX) tablet 40 mg  40 mg Oral Daily PRN Persons, Bevely Palmer, [place]      . HYDROmorphone (DILAUDID) injection 0.5 mg  0.5 mg Intravenous Q4H PRN Persons, Bevely Palmer, PA      . insulin aspart (novoLOG) injection 0-15 Units  0-15 Units Subcutaneous TID WC Persons, Bevely Palmer, [place]   2 Units at 11/13/19 0831  . insulin glargine (LANTUS) injection 40 Units  40 Units Subcutaneous Daily Newt Minion, MD   40 Units at 11/13/19 [phone]  . ipratropium (ATROVENT) nebulizer solution 0.5 mg  0.5 mg Nebulization Q6H PRN Persons, Bevely Palmer, PA      . metFORMIN (GLUCOPHAGE-XR) 24 hr tablet 500 mg  500 mg Oral Q  breakfast Persons, Bevely Palmer, PA   500 mg at 11/13/19 [phone]  . metoCLOPramide (REGLAN) tablet 5-10 mg  5-10 mg Oral Q8H PRN Persons, Bevely Palmer, PA       Or  . metoCLOPramide (REGLAN) injection 5-10 mg  5-10 mg Intravenous Q8H PRN Persons, Bevely Palmer, PA      . mirabegron ER (MYRBETRIQ) tablet 50 mg  50 mg Oral Daily Persons, Bevely Palmer, [place]   50 mg at 11/13/19 [phone]  . ondansetron (ZOFRAN) tablet 4 mg  4 mg Oral Q6H PRN Persons, Bevely Palmer, PA       Or  . ondansetron [facility]) injection 4 mg  4 mg Intravenous Q6H PRN Persons, Bevely Palmer, [place]      . oxyCODONE (Oxy IR/ROXICODONE) immediate release tablet 5-10 mg  5-10 mg Oral Q4H PRN Persons, Bevely Palmer, PA   10 mg at 11/13/19 0730  . pantoprazole (PROTONIX) EC tablet 40 mg  40 mg Oral Daily Persons, Bevely Palmer, [place]   40 mg at 11/13/19 0839  . pregabalin (LYRICA) capsule 200 mg  200 mg Oral TID Persons, Bevely Palmer, PA   200 mg at 11/13/19 0840  . rosuvastatin (CRESTOR) tablet 20 mg  20 mg Oral Daily Persons, Bevely Palmer, [place]   20 mg at 11/13/19 0841  . traZODone (DESYREL) tablet 100 mg  100 mg Oral QHS PRN Persons, Bevely Palmer, PA      . umeclidinium-vilanterol (ANORO ELLIPTA) 62.5-25 MCG/INH 1 puff  1 puff Inhalation Daily Persons, Bevely Palmer, [place]   1 puff at 11/13/19 [id number]     Discharge Medications: Please see discharge summary for a list of discharge medications.  Relevant Imaging Results:  Relevant Lab Results:   Additional Information SS# [id number]  Bary Castilla, LCSW

## 2019-11-13 NOTE — Progress Notes (Addendum)
     Subjective: 2 Days Post-Op Procedure(s) (LRB): RIGHT SHOULDER ARTHROSCOPY AND DEBRIDEMENT (Right)  Awake, alert and oriented x 4. OOB to bedside recliner. PT/OT. She reports she is being assessed for SNF.  Patient reports pain as moderate.    Objective:   VITALS:  Temp:  [97.6 F (36.4 C)-98.2 F (36.8 C)] 97.6 F (36.4 C) (11/22 0741) Pulse Rate:  [59-68] 66 (11/22 0741) Resp:  [17-19] 17 (11/22 0741) BP: (119-149)/(61-90) 119/61 (11/22 0741) SpO2:  [90 %-97 %] 97 % (11/22 0741)  Neurologically intact ABD soft Neurovascular intact Sensation intact distally Intact pulses distally Dorsiflexion/Plantar flexion intact Incision: moderate dry blood on the posterior dressing rest dry, dressing changed to opsites with 2x2s No cellulitis present Compartment soft   LABS Recent Labs    11/11/19 0803  HGB 14.1  WBC 9.0  PLT 148*   Recent Labs    11/11/19 0803  NA 139  K 4.1  CL 103  CO2 24  BUN 12  CREATININE 0.88  GLUCOSE 175*   No results for input(s): LABPT, INR in the last 72 hours.   Assessment/Plan: 2 Days Post-Op Procedure(s) (LRB): RIGHT SHOULDER ARTHROSCOPY AND DEBRIDEMENT (Right)  Advance diet Up with therapy Discharge to SNF  Social service consult. Will be ready for discharge when SNF is available and pending insurance approval as patient indicates she can not afford.   Basil Dess 11/13/2019, 10:44 AMPatient ID: Jennifer [name], female   DOB: 07-15-1961, 58 y.o.   MRN: [id number]

## 2019-11-13 NOTE — TOC Initial Note (Addendum)
Transition of Care [facility]) - Initial/Assessment Note    Patient Details  Name: Jennifer [name] MRN: [id number] Date of Birth: 09-22-1961  Transition of Care [facility]) CM/SW Contact:    Bary Castilla, LCSW Phone Number: [phone] 11/13/2019, 3:02 PM  Clinical Narrative:                  CSW initially with patient yesterday to discuss the recommendation of a SNF requested by OT. Patient stated that she needed time to think about it as well as receive more information about HH. Case manager spoke with her as well.  CSW followed up again today and patient is agreeable to SNF. CSW discussed the SNF process and gave patient the medicare.gov rating list. Patient informed CSW that she did not want to go to [facility] nor [facility] and CSW informed her that the referral would not be sent to those facilities. CSW informed patient that the [facility] team will follow up once bed offers are received.  Patient additionally mentioned that she did not want to pay for transport and could her niece transport her. CSW informed her that once a facility is secured that it could be followed up on about family members transport and the potential risk involved.  TOC team will continue to follow for discharge planning..   Expected Discharge Plan: Skilled Nursing Facility Barriers to Discharge: Continued Medical Work up, Ship broker, SNF Pending bed offer   Patient Goals and CMS Choice Patient states their goals for this hospitalization and ongoing recovery are:: to be able to go home and get strength back CMS Medicare.gov Compare Post Acute Care list provided to:: Patient Choice offered to / list presented to : Patient  Expected Discharge Plan and Services Expected Discharge Plan: [place]   Discharge Planning Services: CM Consult   Living arrangements for the past 2 months: Single Family Home Expected Discharge Date: 11/14/19                                     Prior Living Arrangements/Services Living arrangements for the past 2 months: Single Family Home Lives with:: Self, Siblings, Relatives Patient language and need for interpreter reviewed:: Yes Do you feel safe going back to the place where you live?: No   Patient states she does not have help at home.  Need for Family Participation in Patient Care: Yes (Comment) Care giver support system in place?: No (comment) Current home services: DME Criminal Activity/Legal Involvement Pertinent to Current Situation/Hospitalization: No - Comment as needed  Activities of Daily Living Home Assistive Devices/Equipment: Eyeglasses, Wheelchair, Other (Comment), Bedside commode/3-in-1, Shower chair with back(PROSTETIC LEG) ADL Screening (condition at time of admission) Patient's cognitive ability adequate to safely complete daily activities?: Yes Is the patient deaf or have difficulty hearing?: No Does the patient have difficulty seeing, even when wearing glasses/contacts?: No Does the patient have difficulty concentrating, remembering, or making decisions?: No Patient able to express need for assistance with ADLs?: Yes Does the patient have difficulty dressing or bathing?: No Independently performs ADLs?: Yes (appropriate for developmental age) Does the patient have difficulty walking or climbing stairs?: Yes Weakness of Legs: Left Weakness of Arms/Hands: None  Permission Sought/Granted   Permission granted to share information with : Yes, Verbal Permission Granted  Share Information with NAME: Elberta Fortis  Permission granted to share info w AGENCY: SNFs  Permission granted to share info  w Relationship: father  Permission granted to share info w Contact Information: [phone]  Emotional Assessment Appearance:: Appears stated age Attitude/Demeanor/Rapport: Engaged, Gracious Affect (typically observed): Appropriate Orientation: : Oriented to Self, Oriented to Place, Oriented to  Time, Oriented to  Situation Alcohol / Substance Use: Not Applicable Psych Involvement: No (comment)  Admission diagnosis:  Right Shoulder Rotator Cuff Tear Patient Active Problem List   Diagnosis Date Noted  . Pain in joint of right shoulder 11/11/2019  . Nontraumatic incomplete tear of right rotator cuff   . CKD (chronic kidney disease) stage 3, GFR 30-59 ml/min 10/18/2019  . 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 Skin rash 06/30/2019  . Impingement syndrome of right shoulder 06/22/2019  . Urinary incontinence 05/13/2018  . OSA (obstructive sleep apnea) 06/12/2017  . Morbid obesity with BMI of 40.0-44.9, adult ([place]) 03/26/2017  . Toe amputation status, right 01/16/2017  . Thrombocytopenia ([place]) 06/05/2016  . 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 Type 2 diabetes with nephropathy ([place]) 09/07/2015  . Uncontrolled diabetes mellitus with retinopathy, due to underlying condition, without macular edema ([place]) 09/05/2015  . Diabetic retinopathy ([place]) 09/05/2015  . Counseling regarding end of life decision making 06/14/2015  . Atherosclerosis of aorta ([place]) 04/04/2015  . Anemia 10/05/2014  . Chronic diastolic heart failure ([place])   . Hx of BKA, left ([place])   . Tobacco abuse   . Severe obesity (BMI >= 40) ([place]) 03/02/2013  . Abnormality of gait 03/01/2013  . Healthcare maintenance 07/10/2012  . Chronic prescription opiate use 12/03/2011  . Secondary diabetes mellitus with peripheral vascular disease ([place]) 08/27/2011  . 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 Hypertension associated with diabetes ([place]) 11/29/2009  . Chronic insomnia 10/25/2009  . GASTROESOPHAGEAL REFLUX DISEASE 11/24/2008  . Depression, major, severe recurrence ([place]) 04/06/2008  . DM (diabetes mellitus) type II uncontrolled, periph vascular disorder ([place]) 04/02/2007  . Hyperlipidemia associated with type 2 diabetes mellitus ([place]) 01/08/2007  . COPD exacerbation ([place]) 01/08/2007   PCP:  Bartholomew Crews, MD Pharmacy:   [id number] - [place], [place] [address] Phone: [phone] Fax: [phone]  [place] [id number] [place] [id number] [place] [address] Phone: [phone] Fax: [phone]     Social Determinants of Health (SDOH) Interventions    Readmission Risk Interventions No flowsheet data found.

## 2019-11-13 NOTE — Anesthesia Postprocedure Evaluation (Signed)
Anesthesia Post Note  Patient: Jennifer [name]  Procedure(s) Performed: RIGHT SHOULDER ARTHROSCOPY AND DEBRIDEMENT (Right Shoulder)     Patient location during evaluation: PACU Anesthesia Type: General Level of consciousness: sedated and patient cooperative Pain management: pain level controlled Vital Signs Assessment: post-procedure vital signs reviewed and stable Respiratory status: spontaneous breathing Cardiovascular status: stable Anesthetic complications: no    Last Vitals:  Vitals:   11/13/19 0741 11/13/19 1927  BP: 119/61 (!) 150/67  Pulse: 66 70  Resp: 17   Temp: 36.4 C 36.7 C  SpO2: 97% 95%    Last Pain:  Vitals:   11/13/19 1927  TempSrc: Oral  PainSc:                  Nolon Nations

## 2019-11-13 NOTE — Discharge Instructions (Addendum)
Right shoulder sling for comfort, remove the sling and perform range of motion exercises 3-4 times daily. Ice packs to the right shoulder 20-30 min on and 30 min off as needed for pain and swelling. Narcotics for pain control Keep incisions dry  For 5 days then my bathe and change dressings with bandaids.  Return to see Dr. Sharol Given in 2 weeks.

## 2019-11-13 NOTE — Progress Notes (Signed)
Occupational Therapy Treatment Patient Details Name: Jennifer [name] MRN: [id number] DOB: [birth year] Today's Date: 11/13/2019    History of present illness 58 y.o. female who was admitted 11/11/2019 with a chief complaint of rotator cuff tear with impingement syndrome, with a final diagnosis of Right Shoulder Rotator Cuff Tear.  Patient was brought to the operating room on 11/11/2019 and underwent RIGHT SHOULDER ARTHROSCOPY AND DEBRIDEMENT, PH significant for CHF, arthritis, cataract, chronic pain, COPD, DM II, L BKA.   OT comments  Pt making steady progress towards OT goals this session. Session focus on functional transfer training as precursor to higher level ADLs. Overall, pt minguard assist for squat pivot transfers from various surfaces. Pt completes UB ADL with MIN A needing cues to position sling straps correctly and supervision/set- up for LB ADL from EOB. Pt demos good safety awareness overall and is agreeable to SNF for DC to assist with managing new NWB status on RUE. Will continue to follow acutely per POC.    Follow Up Recommendations  SNF;Supervision/Assistance - 24 hour    Equipment Recommendations       Recommendations for Other Services      Precautions / Restrictions Precautions Precautions: Fall Required Braces or Orthoses: Sling Restrictions Weight Bearing Restrictions: Yes RUE Weight Bearing: Non weight bearing       Mobility Bed Mobility Overal bed mobility: Needs Assistance Bed Mobility: Supine to Sit     Supine to sit: HOB elevated;Supervision     General bed mobility comments: supervision for safety, use of bed features noted  Transfers Overall transfer level: Needs assistance Equipment used: None Transfers: Squat Pivot Transfers     Squat pivot transfers: Min guard     General transfer comment: pt able to complete x4 squat pivot tranfers from EOB>recliner>EOB>w/c. pt uses supporting surface to assist with balance during transfers     Balance Overall balance assessment: Needs assistance Sitting-balance support: No upper extremity supported;Feet supported Sitting balance-Leahy Scale: Good Sitting balance - Comments: supervision   Standing balance support: Single extremity supported Standing balance-Leahy Scale: Fair Standing balance comment: minG-close supervision                           ADL either performed or assessed with clinical judgement   ADL Overall ADL's : Needs assistance/impaired                 Upper Body Dressing : Minimal assistance;Cueing for sequencing;Sitting Upper Body Dressing Details (indicate cue type and reason): pt able to don sling but required MIN A to orient straps correctly, cues for sequencing of task Lower Body Dressing: Supervision/safety;Set up;Sitting/lateral leans Lower Body Dressing Details (indicate cue type and reason): able to don prosthetic from EOB with set-up/ supervision Toilet Transfer: Min guard;Squat-pivot Toilet Transfer Details (indicate cue type and reason): pt performed x4 simualted toilet transfers via squat pivot from EOB>recliner>EOB>w/c. Pt completes transfer with min guard for safety. Pt demos good safety awareness able to lock brakes and positon w/c/ recliner close enough to complete safe squat pivot         Functional mobility during ADLs: Min guard(squat pivot transfers only) General ADL Comments: session focus on functional transfer training. overall pt min guard for squat pivot transfers from various surfaces. MIN A UB ADL, supervision LB ADL     Vision Patient Visual Report: No change from baseline Vision Assessment?: No apparent visual deficits   Perception     Praxis  Cognition Arousal/Alertness: Awake/alert Behavior During Therapy: WFL for tasks assessed/performed                                   General Comments: pleasant and asking appropriate questions about DC        Exercises     Shoulder  Instructions       General Comments      Pertinent Vitals/ Pain       Pain Assessment: 0-10 Pain Score: 8  Pain Location: RUE Pain Descriptors / Indicators: Aching;Constant Pain Intervention(s): Monitored during session;Repositioned  Home Living                                          Prior Functioning/Environment              Frequency  Min 2X/week        Progress Toward Goals  OT Goals(current goals can now be found in the care plan section)  Progress towards OT goals: Progressing toward goals  Acute Rehab OT Goals Patient Stated Goal: to return to independence OT Goal Formulation: With patient Time For Goal Achievement: 11/19/19 Potential to Achieve Goals: Good  Plan Discharge plan remains appropriate    Co-evaluation                 AM-PAC OT "6 Clicks" Daily Activity     Outcome Measure   Help from another person eating meals?: None Help from another person taking care of personal grooming?: A Little Help from another person toileting, which includes using toliet, bedpan, or urinal?: A Little Help from another person bathing (including washing, rinsing, drying)?: A Little Help from another person to put on and taking off regular upper body clothing?: A Little Help from another person to put on and taking off regular lower body clothing?: None 6 Click Score: 20    End of Session Equipment Utilized During Treatment: Gait belt  OT Visit Diagnosis: Unsteadiness on feet (R26.81);Other abnormalities of gait and mobility (R26.89);Repeated falls (R29.6);Pain Pain - part of body: Shoulder   Activity Tolerance Patient tolerated treatment well   Patient Left in chair;with call bell/phone within reach;with chair alarm set   Nurse Communication Mobility status        Time: [id number] OT Time Calculation (min): 32 min  Charges: OT General Charges $OT Visit: 1 Visit OT Treatments $Self Care/Home Management : 8-22  mins $Therapeutic Activity: 8-22 mins  Lanier Clam., COTA/L Acute Rehabilitation Services [phone]    Ihor Gully 11/13/2019, 12:01 PM

## 2019-11-14 ENCOUNTER — Encounter (HOSPITAL_COMMUNITY): Payer: Self-pay | Admitting: Orthopedic Surgery

## 2019-11-14 DIAGNOSIS — M75121 Complete rotator cuff tear or rupture of right shoulder, not specified as traumatic: Secondary | ICD-10-CM | POA: Diagnosis not present

## 2019-11-14 LAB — GLUCOSE, CAPILLARY
Glucose-Capillary: 179 mg/dL — ABNORMAL HIGH (ref 70–99)
Glucose-Capillary: 275 mg/dL — ABNORMAL HIGH (ref 70–99)
Glucose-Capillary: 171 mg/dL — ABNORMAL HIGH (ref 70–99)
Glucose-Capillary: 215 mg/dL — ABNORMAL HIGH (ref 70–99)

## 2019-11-14 LAB — SARS CORONAVIRUS 2 (TAT 6-24 HRS): SARS Coronavirus 2: NEGATIVE

## 2019-11-14 MED ORDER — OXYCODONE-ACETAMINOPHEN 5-325 MG PO TABS: 1.0000 | ORAL_TABLET | ORAL | 0 refills | Status: DC | PRN

## 2019-11-14 NOTE — Discharge Summary (Signed)
Discharge Diagnoses:  Active Problems:   Impingement syndrome of right shoulder   Nontraumatic incomplete tear of right rotator cuff   Pain in joint of right shoulder   Surgeries: Procedure(s): RIGHT SHOULDER ARTHROSCOPY AND DEBRIDEMENT on 11/11/2019    Consultants:   Discharged Condition: Improved  Hospital Course: Jennifer [name] is an 58 y.o. female who was admitted 11/11/2019 with a chief complaint of Right shoulder pain, with a final diagnosis of Right Shoulder Rotator Cuff Tear.  Patient was brought to the operating room on 11/11/2019 and underwent Procedure(s): RIGHT SHOULDER ARTHROSCOPY AND DEBRIDEMENT.    Patient was given perioperative antibiotics:  Anti-infectives (From admission, onward)   Start     Dose/Rate Route Frequency Ordered Stop   11/11/19 1600  ceFAZolin (ANCEF) IVPB 1 g/50 mL premix     1 g 100 mL/hr over 30 Minutes Intravenous Every 6 hours 11/11/19 1550 11/12/19 0445   11/11/19 0715  ceFAZolin (ANCEF) IVPB 2g/100 mL premix     2 g 200 mL/hr over 30 Minutes Intravenous On call to O.R. 11/11/19 [id number] 11/11/19 0940    .  Patient was given sequential compression devices, early ambulation, and aspirin for DVT prophylaxis.  Recent vital signs:  Patient Vitals for the past 24 hrs:  BP Temp Temp src Pulse SpO2  11/14/19 0505 (!) 143/62 98.3 F (36.8 C) Oral 71 92 %  11/13/19 1927 (!) 150/67 98.1 F (36.7 C) Oral 70 95 %  .  Recent laboratory studies: No results found.  Discharge Medications:   Allergies as of 11/14/2019      Reactions   Iohexol Other (See Comments)    Desc: IV CONTRAST CAUSE NEPHROPATHY IN 2007   Ivp Dye [iodinated Diagnostic Agents] Other (See Comments)   Desc: IV CONTRAST CAUSE NEPHROPATHY IN 2007   Abilify [aripiprazole] Other (See Comments)   Urinary freq Nov 2016   Morphine Sulfate Itching, Rash      Medication List    TAKE these medications   albuterol (2.5 MG/3ML) 0.083% nebulizer solution Commonly known as:  PROVENTIL INHALE THE CONTENTS OF 1 VIAL VIA NEBULIZER EVERY 6 HOURS AS NEEDED FOR WHEEZING What changed: See the new instructions.   ProAir HFA 108 (90 Base) MCG/ACT inhaler Generic drug: albuterol INHALE 2 PUFFS BY MOUTH EVERY 6 HOURS AS NEEDED FOR WHEEZING What changed: See the new instructions.   amLODipine 10 MG tablet Commonly known as: NORVASC Take 1 tablet (10 mg total) by mouth daily.   Anoro Ellipta 62.5-25 MCG/INH Aepb Generic drug: umeclidinium-vilanterol TAKE 1 PUFF BY MOUTH EVERY DAY What changed: See the new instructions.   aspirin EC 81 MG tablet Take 81 mg by mouth daily.   atenolol 100 MG tablet Commonly known as: TENORMIN TAKE 1 TABLET BY MOUTH EVERY DAY   baclofen 10 MG tablet Commonly known as: LIORESAL TAKE 1 TABLET BY MOUTH THREE TIMES A DAY AS NEEDED FOR MUSCLE SPASMS What changed: See the new instructions.   benazepril 40 MG tablet Commonly known as: LOTENSIN Take 1 tablet (40 mg total) by mouth daily.   benzonatate 100 MG capsule Commonly known as: Tessalon Perles Take 1 capsule (100 mg total) by mouth at bedtime as needed for cough.   betamethasone dipropionate 0.05 % cream Apply 1 application topically 2 (two) times daily as needed (irritation).   Biotin 5 MG Tabs Take 5 mg by mouth daily.   buPROPion 300 MG 24 hr tablet Commonly known as: WELLBUTRIN XL TAKE 1 TABLET BY MOUTH DAILY  cholecalciferol 25 MCG (1000 UT) tablet Commonly known as: VITAMIN D3 Take 1,000 Units by mouth daily.   diphenhydrAMINE 25 mg capsule Commonly known as: BENADRYL Take 1 capsule (25 mg total) by mouth every 6 (six) hours as needed for itching, allergies or sleep.   Fish Oil 1000 MG Caps Take 1,000 mg by mouth daily.   fluticasone 50 MCG/ACT nasal spray Commonly known as: FLONASE INSTILL 1 SPRAY IN EACH NOSTRIL DAILY What changed:   how much to take  how to take this  when to take this  reasons to take this  additional instructions    furosemide 40 MG tablet Commonly known as: LASIX TAKE ONE TABLET BY MOUTH DAILY AS NEEDED What changed: reasons to take this   hydrochlorothiazide 25 MG tablet Commonly known as: HYDRODIURIL TAKE 1 TABLET BY MOUTH ONCE DAILY   hydrOXYzine 25 MG capsule Commonly known as: VISTARIL Take 2 capsules (50 mg total) by mouth daily.   insulin lispro 100 UNIT/ML KwikPen Commonly known as: HumaLOG KwikPen If blood sugar is < 175 Do not take any correction insulin. 176-225 inject 2 units. 226-275 inject 4 units. [id number] inject 6 units. 326-375 inject 8 units. 376-425 inject 10 units. 426-475 inject 12 units. [id number] inject 14 units and call office for further instructions. What changed:   how much to take  how to take this  when to take this  additional instructions   Insulin Pen Needle 32G X 4 MM Misc Use to inject insulin 4 times a day and semaglutide once weekly   Insulin Syringe-Needle U-100 31G X 15/64" 0.3 ML Misc Use with Humalog to correct blood sugar before meals three times a day   ipratropium 0.02 % nebulizer solution Commonly known as: ATROVENT USE 1 VIAL VIA NEBULIZER EVERY 6 HOURS AS NEEDED FOR WHEEZING   metFORMIN 500 MG 24 hr tablet Commonly known as: GLUCOPHAGE-XR TAKE 1 TABLET BY MOUTH EVERY DAY WITH BREAKFAST What changed: See the new instructions.   multivitamin with minerals Tabs tablet Take 1 tablet by mouth daily.   Myrbetriq 50 MG Tb24 tablet Generic drug: mirabegron ER TAKE 1 TABLET BY MOUTH DAILY   nystatin powder Commonly known as: nystatin APPLY TO AFFECTED AREA THREE TIMES A DAY What changed:   how much to take  how to take this  when to take this   omeprazole 20 MG capsule Commonly known as: PRILOSEC TAKE 1 CAPSULE (20 MG TOTAL) BY MOUTH 2 (TWO) TIMES DAILY BEFORE A MEAL.   OneTouch Verio test strip Generic drug: glucose blood USE TO TEST BLOOD SUGAR 3-4 TIMES DAILY   oxyCODONE-acetaminophen 5-325 MG tablet Commonly known as:  Percocet Take 1 tablet by mouth every 4 (four) hours as needed for severe pain. What changed:   how much to take  how to take this  when to take this  reasons to take this  additional instructions   oxyCODONE-acetaminophen 5-325 MG tablet Commonly known as: Percocet Take 1 tablet by mouth every 4 (four) hours as needed for severe pain. What changed: You were already taking a medication with the same name, and this prescription was added. Make sure you understand how and when to take each.   Ozempic (0.25 or 0.5 MG/DOSE) 2 MG/1.5ML Sopn Generic drug: Semaglutide(0.25 or 0.5MG /DOS) INJECT 0.5 MG INTO THE SKIN ONCE A WEEK.   pregabalin 200 MG capsule Commonly known as: LYRICA TAKE 1 CAPSULE BY MOUTH THREE TIMES A DAY What changed: See the new instructions.   rosuvastatin  20 MG tablet Commonly known as: CRESTOR TAKE 1 TABLET BY MOUTH AT BEDTIME What changed: when to take this   traZODone 100 MG tablet Commonly known as: DESYREL Take 100 mg by mouth at bedtime as needed for sleep.   [name] FlexTouch 200 UNIT/ML Sopn Generic drug: Insulin Degludec Inject 70 Units into the skin daily.   vitamin B-12 500 MCG tablet Commonly known as: CYANOCOBALAMIN Take 500 mcg by mouth daily.   vitamin C 500 MG tablet Commonly known as: ASCORBIC ACID Take 500 mg by mouth daily.       Diagnostic Studies: Dg Chest Port 1 View  Result Date: 10/17/2019 CLINICAL DATA:  58 year old female with fever and cough. EXAM: PORTABLE CHEST 1 VIEW COMPARISON:  Chest radiograph dated 05/11/2019 FINDINGS: There is mild cardiomegaly with mild vascular congestion. Bibasilar densities, right greater left concerning for infiltrate. Clinical correlation and follow-up to resolution recommended. There is no large pleural effusion. No pneumothorax. Atherosclerotic calcification of the aorta. No acute osseous pathology. IMPRESSION: 1. Mild cardiomegaly with mild vascular congestion. 2. Bibasilar densities,  right greater left concerning for infiltrate. Clinical correlation and follow-up to resolution recommended. Electronically Signed   By: Anner Crete M.D.   On: 10/17/2019 00:49    Patient benefited maximally from their hospital stay and there were no complications.     Disposition: Discharge disposition: 03-Skilled Nursing Facility      Discharge Instructions    Call MD / Call 911   Complete by: As directed    If you experience chest pain or shortness of breath, CALL 911 and be transported to the hospital emergency room.  If you develope a fever above 101 F, pus (white drainage) or increased drainage or redness at the wound, or calf pain, call your surgeon's office.   Call MD / Call 911   Complete by: As directed    If you experience chest pain or shortness of breath, CALL 911 and be transported to the hospital emergency room.  If you develope a fever above 101 F, pus (white drainage) or increased drainage or redness at the wound, or calf pain, call your surgeon's office.   Call MD / Call 911   Complete by: As directed    If you experience chest pain or shortness of breath, CALL 911 and be transported to the hospital emergency room.  If you develope a fever above 101 F, pus (white drainage) or increased drainage or redness at the wound, or calf pain, call your surgeon's office.   Call MD / Call 911   Complete by: As directed    If you experience chest pain or shortness of breath, CALL 911 and be transported to the hospital emergency room.  If you develope a fever above 101 F, pus (white drainage) or increased drainage or redness at the wound, or calf pain, call your surgeon's office.   Call MD / Call 911   Complete by: As directed    If you experience chest pain or shortness of breath, CALL 911 and be transported to the hospital emergency room.  If you develope a fever above 101 F, pus (white drainage) or increased drainage or redness at the wound, or calf pain, call your surgeon's  office.   Constipation Prevention   Complete by: As directed    Drink plenty of fluids.  Prune juice may be helpful.  You may use a stool softener, such as Colace (over the counter) 100 mg twice a day.  Use MiraLax (over  the counter) for constipation as needed.   Constipation Prevention   Complete by: As directed    Drink plenty of fluids.  Prune juice may be helpful.  You may use a stool softener, such as Colace (over the counter) 100 mg twice a day.  Use MiraLax (over the counter) for constipation as needed.   Constipation Prevention   Complete by: As directed    Drink plenty of fluids.  Prune juice may be helpful.  You may use a stool softener, such as Colace (over the counter) 100 mg twice a day.  Use MiraLax (over the counter) for constipation as needed.   Constipation Prevention   Complete by: As directed    Drink plenty of fluids.  Prune juice may be helpful.  You may use a stool softener, such as Colace (over the counter) 100 mg twice a day.  Use MiraLax (over the counter) for constipation as needed.   Constipation Prevention   Complete by: As directed    Drink plenty of fluids.  Prune juice may be helpful.  You may use a stool softener, such as Colace (over the counter) 100 mg twice a day.  Use MiraLax (over the counter) for constipation as needed.   Diet - low sodium heart healthy   Complete by: As directed    Diet - low sodium heart healthy   Complete by: As directed    Diet - low sodium heart healthy   Complete by: As directed    Diet - low sodium heart healthy   Complete by: As directed    Diet - low sodium heart healthy   Complete by: As directed    Discharge instructions   Complete by: As directed    Keep dressing clean and dry. Follow up 1 week Dr. Sharol Given   Discharge instructions   Complete by: As directed    Right shoulder sling for comfort, remove the sling and perform range of motion exercises 3-4 times daily. Ice packs to the right shoulder 20-30 min on and 30 min  off as needed for pain and swelling. Narcotics for pain control Keep incisions dry  For 5 days then my bathe and change dressings with bandaids.  Return to see Dr. Sharol Given in 2 weeks.  Call if fever greater than 101.5. If shortness of breath of fever greater than 102 go to the ER for evaluation. Practice deep breathing and incentive spirometry every hour when awake for 3--5 minutes. Move your right elbow and right wrist hand and fingers to prevent stiffness.   Discharge instructions   Complete by: As directed    Elevate R upper extremity .   Increase activity slowly as tolerated   Complete by: As directed    Increase activity slowly as tolerated   Complete by: As directed    Increase activity slowly as tolerated   Complete by: As directed    Increase activity slowly as tolerated   Complete by: As directed    Increase activity slowly as tolerated   Complete by: As directed      Follow-up Information    Newt Minion, MD Follow up in 1 week(s).   Specialty: Orthopedic Surgery Contact information: [address] [place] [address] [phone]            Signed: Bevely Palmer Shahd Occhipinti 11/14/2019, 7:53 AM

## 2019-11-14 NOTE — Progress Notes (Signed)
S/P Right shoulder scope. Requesting SNF if Insurance will pay. C/O some pain.  VSS  Afebrile. Dressing CDI strong radial pulse Fingers warm and pink. Some swelling in fingers.   Encouraged to move fingers. DC today hopefully

## 2019-11-14 NOTE — Plan of Care (Addendum)
Pt would like an update about insurance authorization for SNF placement as soon as there is one to give. COVID test performed today, results pending.  Problem: Clinical Measurements: Goal: Will remain free from infection Outcome: Progressing   Problem: Coping: Goal: Level of anxiety will decrease Outcome: Progressing   Problem: Pain Managment: Goal: General experience of comfort will improve Outcome: Progressing   Problem: Safety: Goal: Ability to remain free from injury will improve Outcome: Progressing   Problem: Skin Integrity: Goal: Risk for impaired skin integrity will decrease Outcome: Progressing

## 2019-11-14 NOTE — Progress Notes (Signed)
Occupational Therapy Treatment Patient Details Name: JACQUESE YARWOOD MRN: [id number] DOB: November 03, 1961 Today's Date: 11/14/2019    History of present illness 58 y.o. female who was admitted 11/11/2019 with a chief complaint of rotator cuff tear with impingement syndrome, with a final diagnosis of Right Shoulder Rotator Cuff Tear.  Patient was brought to the operating room on 11/11/2019 and underwent RIGHT SHOULDER ARTHROSCOPY AND DEBRIDEMENT, PH significant for CHF, arthritis, cataract, chronic pain, COPD, DM II, L BKA.   OT comments  Pt making good progress with functional goals. Pt sat EOB with sup and participated in UB min guard A - sup/set up and LB ADL tasks mod/min A - sup/set up. Pt performed sit - stand to RW min guard A to SPT to BSC, recliner, w/c and back to bed with min guard A. Pt declined sitting up in recliner. OT will continue to follow acutely  Follow Up Recommendations  SNF;Supervision/Assistance - 24 hour    Equipment Recommendations  Other (comment)(TBD at next venue of care)    Recommendations for Other Services      Precautions / Restrictions Precautions Precautions: Fall Required Braces or Orthoses: Sling Restrictions Weight Bearing Restrictions: Yes RUE Weight Bearing: Non weight bearing       Mobility Bed Mobility Overal bed mobility: Needs Assistance Bed Mobility: Supine to Sit;Sit to Supine     Supine to sit: HOB elevated;Supervision Sit to supine: Supervision   General bed mobility comments: supervision for safety  Transfers Overall transfer level: Needs assistance Equipment used: Rolling walker (2 wheeled) Transfers: Stand Pivot Transfers Sit to Stand: Min guard Stand pivot transfers: Min guard;From elevated surface       General transfer comment: donned L prosthesis prior ro transfers    Balance                                           ADL either performed or assessed with clinical judgement   ADL Overall ADL's :  Needs assistance/impaired Eating/Feeding: Sitting;Independent   Grooming: Wash/dry hands;Wash/dry face;Set up;Sitting   Upper Body Bathing: Min guard;Sitting   Lower Body Bathing: Moderate assistance;Minimal assistance;Cueing for safety;Sit to/from stand   Upper Body Dressing : Min guard;Sitting Upper Body Dressing Details (indicate cue type and reason): min A to correctly don sling Lower Body Dressing: Set up;Supervision/safety;Sitting/lateral leans   Toilet Transfer: Min guard;Stand-pivot;BSC;RW   Toileting- Clothing Manipulation and Hygiene: Moderate assistance;Cueing for safety;Cueing for sequencing;Sit to/from stand       Functional mobility during ADLs: Min guard General ADL Comments: min guard A - sup/set up with UB ADLs seated and mod - min A with LB bathing, sup with dressing     Vision Patient Visual Report: No change from baseline     Perception     Praxis      Cognition Arousal/Alertness: Awake/alert Behavior During Therapy: WFL for tasks assessed/performed Overall Cognitive Status: Within Functional Limits for tasks assessed                                          Exercises     Shoulder Instructions       General Comments      Pertinent Vitals/ Pain       Pain Assessment: 0-10 Pain Score: 6  Pain Location: R UE  Pain Descriptors / Indicators: Aching;Constant Pain Intervention(s): Monitored during session;Repositioned  Home Living                                          Prior Functioning/Environment              Frequency  Min 2X/week        Progress Toward Goals  OT Goals(current goals can now be found in the care plan section)  Progress towards OT goals: Progressing toward goals     Plan Discharge plan remains appropriate    Co-evaluation                 AM-PAC OT "6 Clicks" Daily Activity     Outcome Measure   Help from another person eating meals?: None Help from another  person taking care of personal grooming?: A Little Help from another person toileting, which includes using toliet, bedpan, or urinal?: A Little Help from another person bathing (including washing, rinsing, drying)?: A Little Help from another person to put on and taking off regular upper body clothing?: A Little Help from another person to put on and taking off regular lower body clothing?: None 6 Click Score: 20    End of Session Equipment Utilized During Treatment: Gait belt;Rolling walker;Other (comment)(BSC)  OT Visit Diagnosis: Unsteadiness on feet (R26.81);Other abnormalities of gait and mobility (R26.89);Repeated falls (R29.6);Pain Pain - Right/Left: Right Pain - part of body: Shoulder   Activity Tolerance Patient tolerated treatment well   Patient Left with call bell/phone within reach;in bed;with nursing/sitter in room   Nurse Communication          Time: [id number] OT Time Calculation (min): 31 min  Charges: OT General Charges $OT Visit: 1 Visit OT Treatments $Self Care/Home Management : 8-22 mins $Therapeutic Activity: 8-22 mins     Britt Bottom 11/14/2019, 12:31 PM

## 2019-11-14 NOTE — Progress Notes (Signed)
Patient has chosen [facility] as her SNF at discharge. CSW confirmed with admissions coordinator that this bed is still available. Will need updated COVID test last one was on 11/17. CSW informed MD. Test has been ordered, just needs to be collected.  Insurance auth started. Waiting on COVID test and insurance auth. Dispo is SNF.

## 2019-11-15 ENCOUNTER — Ambulatory Visit: Payer: Medicare Other | Admitting: Dietician

## 2019-11-15 DIAGNOSIS — M75121 Complete rotator cuff tear or rupture of right shoulder, not specified as traumatic: Secondary | ICD-10-CM | POA: Diagnosis not present

## 2019-11-15 LAB — GLUCOSE, CAPILLARY
Glucose-Capillary: 254 mg/dL — ABNORMAL HIGH (ref 70–99)
Glucose-Capillary: 210 mg/dL — ABNORMAL HIGH (ref 70–99)
Glucose-Capillary: 189 mg/dL — ABNORMAL HIGH (ref 70–99)
Glucose-Capillary: 229 mg/dL — ABNORMAL HIGH (ref 70–99)

## 2019-11-15 NOTE — Discharge Summary (Signed)
Discharge Diagnoses:  Active Problems:   Impingement syndrome of right shoulder   Nontraumatic incomplete tear of right rotator cuff   Pain in joint of right shoulder   Surgeries: Procedure(s): RIGHT SHOULDER ARTHROSCOPY AND DEBRIDEMENT on 11/11/2019    Consultants:   Discharged Condition: Improved  Hospital Course: Jennifer [name] is an 58 y.o. female who was admitted 11/11/2019 with a chief complaint of Right shoulder pain, with a final diagnosis of Right Shoulder Rotator Cuff Tear.  Patient was brought to the operating room on 11/11/2019 and underwent Procedure(s): RIGHT SHOULDER ARTHROSCOPY AND DEBRIDEMENT.    Patient was given perioperative antibiotics:  Anti-infectives (From admission, onward)   Start     Dose/Rate Route Frequency Ordered Stop   11/11/19 1600  ceFAZolin (ANCEF) IVPB 1 g/50 mL premix     1 g 100 mL/hr over 30 Minutes Intravenous Every 6 hours 11/11/19 1550 11/12/19 0445   11/11/19 0715  ceFAZolin (ANCEF) IVPB 2g/100 mL premix     2 g 200 mL/hr over 30 Minutes Intravenous On call to O.R. 11/11/19 [id number] 11/11/19 0940    .  Patient was given sequential compression devices, early ambulation, and aspirin for DVT prophylaxis.  Recent vital signs:  Patient Vitals for the past 24 hrs:  BP Temp Temp src Pulse Resp SpO2  11/15/19 0409 (!) 124/57 98.4 F (36.9 C) Oral 74 18 90 %  11/14/19 2018 124/90 98.4 F (36.9 C) Oral 77 -- 94 %  11/14/19 1000 (!) 153/90 98.8 F (37.1 C) Oral 79 -- 95 %  .  Recent laboratory studies: No results found.  Discharge Medications:   Allergies as of 11/15/2019      Reactions   Iohexol Other (See Comments)    Desc: IV CONTRAST CAUSE NEPHROPATHY IN 2007   Ivp Dye [iodinated Diagnostic Agents] Other (See Comments)   Desc: IV CONTRAST CAUSE NEPHROPATHY IN 2007   Abilify [aripiprazole] Other (See Comments)   Urinary freq Nov 2016   Morphine Sulfate Itching, Rash      Medication List    TAKE these medications   albuterol  (2.5 MG/3ML) 0.083% nebulizer solution Commonly known as: PROVENTIL INHALE THE CONTENTS OF 1 VIAL VIA NEBULIZER EVERY 6 HOURS AS NEEDED FOR WHEEZING What changed: See the new instructions.   ProAir HFA 108 (90 Base) MCG/ACT inhaler Generic drug: albuterol INHALE 2 PUFFS BY MOUTH EVERY 6 HOURS AS NEEDED FOR WHEEZING What changed: See the new instructions.   amLODipine 10 MG tablet Commonly known as: NORVASC Take 1 tablet (10 mg total) by mouth daily.   Anoro Ellipta 62.5-25 MCG/INH Aepb Generic drug: umeclidinium-vilanterol TAKE 1 PUFF BY MOUTH EVERY DAY What changed: See the new instructions.   aspirin EC 81 MG tablet Take 81 mg by mouth daily.   atenolol 100 MG tablet Commonly known as: TENORMIN TAKE 1 TABLET BY MOUTH EVERY DAY   baclofen 10 MG tablet Commonly known as: LIORESAL TAKE 1 TABLET BY MOUTH THREE TIMES A DAY AS NEEDED FOR MUSCLE SPASMS What changed: See the new instructions.   benazepril 40 MG tablet Commonly known as: LOTENSIN Take 1 tablet (40 mg total) by mouth daily.   benzonatate 100 MG capsule Commonly known as: Tessalon Perles Take 1 capsule (100 mg total) by mouth at bedtime as needed for cough.   betamethasone dipropionate 0.05 % cream Apply 1 application topically 2 (two) times daily as needed (irritation).   Biotin 5 MG Tabs Take 5 mg by mouth daily.   buPROPion  300 MG 24 hr tablet Commonly known as: WELLBUTRIN XL TAKE 1 TABLET BY MOUTH DAILY   cholecalciferol 25 MCG (1000 UT) tablet Commonly known as: VITAMIN D3 Take 1,000 Units by mouth daily.   diphenhydrAMINE 25 mg capsule Commonly known as: BENADRYL Take 1 capsule (25 mg total) by mouth every 6 (six) hours as needed for itching, allergies or sleep.   Fish Oil 1000 MG Caps Take 1,000 mg by mouth daily.   fluticasone 50 MCG/ACT nasal spray Commonly known as: FLONASE INSTILL 1 SPRAY IN EACH NOSTRIL DAILY What changed:   how much to take  how to take this  when to take  this  reasons to take this  additional instructions   furosemide 40 MG tablet Commonly known as: LASIX TAKE ONE TABLET BY MOUTH DAILY AS NEEDED What changed: reasons to take this   hydrochlorothiazide 25 MG tablet Commonly known as: HYDRODIURIL TAKE 1 TABLET BY MOUTH ONCE DAILY   hydrOXYzine 25 MG capsule Commonly known as: VISTARIL Take 2 capsules (50 mg total) by mouth daily.   insulin lispro 100 UNIT/ML KwikPen Commonly known as: HumaLOG KwikPen If blood sugar is < 175 Do not take any correction insulin. 176-225 inject 2 units. 226-275 inject 4 units. [id number] inject 6 units. 326-375 inject 8 units. 376-425 inject 10 units. 426-475 inject 12 units. [id number] inject 14 units and call office for further instructions. What changed:   how much to take  how to take this  when to take this  additional instructions   Insulin Pen Needle 32G X 4 MM Misc Use to inject insulin 4 times a day and semaglutide once weekly   Insulin Syringe-Needle U-100 31G X 15/64" 0.3 ML Misc Use with Humalog to correct blood sugar before meals three times a day   ipratropium 0.02 % nebulizer solution Commonly known as: ATROVENT USE 1 VIAL VIA NEBULIZER EVERY 6 HOURS AS NEEDED FOR WHEEZING   metFORMIN 500 MG 24 hr tablet Commonly known as: GLUCOPHAGE-XR TAKE 1 TABLET BY MOUTH EVERY DAY WITH BREAKFAST What changed: See the new instructions.   multivitamin with minerals Tabs tablet Take 1 tablet by mouth daily.   Myrbetriq 50 MG Tb24 tablet Generic drug: mirabegron ER TAKE 1 TABLET BY MOUTH DAILY   nystatin powder Commonly known as: nystatin APPLY TO AFFECTED AREA THREE TIMES A DAY What changed:   how much to take  how to take this  when to take this   omeprazole 20 MG capsule Commonly known as: PRILOSEC TAKE 1 CAPSULE (20 MG TOTAL) BY MOUTH 2 (TWO) TIMES DAILY BEFORE A MEAL.   OneTouch Verio test strip Generic drug: glucose blood USE TO TEST BLOOD SUGAR 3-4 TIMES DAILY    oxyCODONE-acetaminophen 5-325 MG tablet Commonly known as: Percocet Take 1 tablet by mouth every 4 (four) hours as needed for severe pain. What changed:   how much to take  how to take this  when to take this  reasons to take this  additional instructions   oxyCODONE-acetaminophen 5-325 MG tablet Commonly known as: Percocet Take 1 tablet by mouth every 4 (four) hours as needed for severe pain. What changed: You were already taking a medication with the same name, and this prescription was added. Make sure you understand how and when to take each.   Ozempic (0.25 or 0.5 MG/DOSE) 2 MG/1.5ML Sopn Generic drug: Semaglutide(0.25 or 0.5MG /DOS) INJECT 0.5 MG INTO THE SKIN ONCE A WEEK.   pregabalin 200 MG capsule Commonly known as: LYRICA  TAKE 1 CAPSULE BY MOUTH THREE TIMES A DAY What changed: See the new instructions.   rosuvastatin 20 MG tablet Commonly known as: CRESTOR TAKE 1 TABLET BY MOUTH AT BEDTIME What changed: when to take this   traZODone 100 MG tablet Commonly known as: DESYREL Take 100 mg by mouth at bedtime as needed for sleep.   [name] FlexTouch 200 UNIT/ML Sopn Generic drug: Insulin Degludec Inject 70 Units into the skin daily.   vitamin B-12 500 MCG tablet Commonly known as: CYANOCOBALAMIN Take 500 mcg by mouth daily.   vitamin C 500 MG tablet Commonly known as: ASCORBIC ACID Take 500 mg by mouth daily.       Diagnostic Studies: Dg Chest Port 1 View  Result Date: 10/17/2019 CLINICAL DATA:  58 year old female with fever and cough. EXAM: PORTABLE CHEST 1 VIEW COMPARISON:  Chest radiograph dated 05/11/2019 FINDINGS: There is mild cardiomegaly with mild vascular congestion. Bibasilar densities, right greater left concerning for infiltrate. Clinical correlation and follow-up to resolution recommended. There is no large pleural effusion. No pneumothorax. Atherosclerotic calcification of the aorta. No acute osseous pathology. IMPRESSION: 1. Mild  cardiomegaly with mild vascular congestion. 2. Bibasilar densities, right greater left concerning for infiltrate. Clinical correlation and follow-up to resolution recommended. Electronically Signed   By: Anner Crete M.D.   On: 10/17/2019 00:49    Patient benefited maximally from their hospital stay and there were no complications.     Disposition: Discharge disposition: 03-Skilled Nursing Facility      Discharge Instructions    Call MD / Call 911   Complete by: As directed    If you experience chest pain or shortness of breath, CALL 911 and be transported to the hospital emergency room.  If you develope a fever above 101 F, pus (white drainage) or increased drainage or redness at the wound, or calf pain, call your surgeon's office.   Call MD / Call 911   Complete by: As directed    If you experience chest pain or shortness of breath, CALL 911 and be transported to the hospital emergency room.  If you develope a fever above 101 F, pus (white drainage) or increased drainage or redness at the wound, or calf pain, call your surgeon's office.   Call MD / Call 911   Complete by: As directed    If you experience chest pain or shortness of breath, CALL 911 and be transported to the hospital emergency room.  If you develope a fever above 101 F, pus (white drainage) or increased drainage or redness at the wound, or calf pain, call your surgeon's office.   Call MD / Call 911   Complete by: As directed    If you experience chest pain or shortness of breath, CALL 911 and be transported to the hospital emergency room.  If you develope a fever above 101 F, pus (white drainage) or increased drainage or redness at the wound, or calf pain, call your surgeon's office.   Call MD / Call 911   Complete by: As directed    If you experience chest pain or shortness of breath, CALL 911 and be transported to the hospital emergency room.  If you develope a fever above 101 F, pus (white drainage) or increased  drainage or redness at the wound, or calf pain, call your surgeon's office.   Call MD / Call 911   Complete by: As directed    If you experience chest pain or shortness of breath, CALL 911  and be transported to the hospital emergency room.  If you develope a fever above 101 F, pus (white drainage) or increased drainage or redness at the wound, or calf pain, call your surgeon's office.   Constipation Prevention   Complete by: As directed    Drink plenty of fluids.  Prune juice may be helpful.  You may use a stool softener, such as Colace (over the counter) 100 mg twice a day.  Use MiraLax (over the counter) for constipation as needed.   Constipation Prevention   Complete by: As directed    Drink plenty of fluids.  Prune juice may be helpful.  You may use a stool softener, such as Colace (over the counter) 100 mg twice a day.  Use MiraLax (over the counter) for constipation as needed.   Constipation Prevention   Complete by: As directed    Drink plenty of fluids.  Prune juice may be helpful.  You may use a stool softener, such as Colace (over the counter) 100 mg twice a day.  Use MiraLax (over the counter) for constipation as needed.   Constipation Prevention   Complete by: As directed    Drink plenty of fluids.  Prune juice may be helpful.  You may use a stool softener, such as Colace (over the counter) 100 mg twice a day.  Use MiraLax (over the counter) for constipation as needed.   Constipation Prevention   Complete by: As directed    Drink plenty of fluids.  Prune juice may be helpful.  You may use a stool softener, such as Colace (over the counter) 100 mg twice a day.  Use MiraLax (over the counter) for constipation as needed.   Constipation Prevention   Complete by: As directed    Drink plenty of fluids.  Prune juice may be helpful.  You may use a stool softener, such as Colace (over the counter) 100 mg twice a day.  Use MiraLax (over the counter) for constipation as needed.   Diet - low  sodium heart healthy   Complete by: As directed    Diet - low sodium heart healthy   Complete by: As directed    Diet - low sodium heart healthy   Complete by: As directed    Diet - low sodium heart healthy   Complete by: As directed    Diet - low sodium heart healthy   Complete by: As directed    Diet - low sodium heart healthy   Complete by: As directed    Discharge instructions   Complete by: As directed    Keep dressing clean and dry. Follow up 1 week Dr. Sharol Given   Discharge instructions   Complete by: As directed    Right shoulder sling for comfort, remove the sling and perform range of motion exercises 3-4 times daily. Ice packs to the right shoulder 20-30 min on and 30 min off as needed for pain and swelling. Narcotics for pain control Keep incisions dry  For 5 days then my bathe and change dressings with bandaids.  Return to see Dr. Sharol Given in 2 weeks.  Call if fever greater than 101.5. If shortness of breath of fever greater than 102 go to the ER for evaluation. Practice deep breathing and incentive spirometry every hour when awake for 3--5 minutes. Move your right elbow and right wrist hand and fingers to prevent stiffness.   Discharge instructions   Complete by: As directed    Elevate R upper extremity .  Increase activity slowly as tolerated   Complete by: As directed    Increase activity slowly as tolerated   Complete by: As directed    Increase activity slowly as tolerated   Complete by: As directed    Increase activity slowly as tolerated   Complete by: As directed    Increase activity slowly as tolerated   Complete by: As directed    Increase activity slowly as tolerated   Complete by: As directed       Contact information for follow-up providers    Newt Minion, MD Follow up in 1 week(s).   Specialty: Orthopedic Surgery Contact information: [place] [address] [phone]            Contact information for after-discharge care     Destination    [facility] Preferred SNF .   Service: Skilled Nursing Contact information: [address] [place] [phone]                   Signed: Bevely Palmer Cyrilla Durkin 11/15/2019, 8:23 AM

## 2019-11-15 NOTE — Progress Notes (Signed)
Doing better. Had to wait for results of COVID  Screen. Plan for DC today.

## 2019-11-15 NOTE — Progress Notes (Signed)
CSW called [facility] to check the status of insurance authorization. CSW informed that the authorization is still pending.   CSW informed the respective SNF of this information. COVID test completed on 11/23.   Patient is medically ready just waiting on insurance auth.

## 2019-11-15 NOTE — Progress Notes (Signed)
Physical Therapy Treatment Patient Details Name: Jennifer [name] MRN: [id number] DOB: December 16, 1961 Today's Date: 11/15/2019    History of Present Illness 58 y.o. female who was admitted 11/11/2019 with a chief complaint of rotator cuff tear with impingement syndrome, with a final diagnosis of Right Shoulder Rotator Cuff Tear.  Patient was brought to the operating room on 11/11/2019 and underwent RIGHT SHOULDER ARTHROSCOPY AND DEBRIDEMENT, PH significant for CHF, arthritis, cataract, chronic pain, COPD, DM II, L BKA.    PT Comments    Continuing work on functional mobility and activity tolerance;  Initiated PT session, help Jennifer [name] finish up in the bathroom; Noting that she is using her RUE a lot, including propping RUE on counter in bathroom, despite reminders for NWB; she declined progressive ambulation, wanting to get back to her breakfast; Continue to recommend SNF for post-acute rehab to maximize independence and safety with mobility prior to dc home  Follow Up Recommendations  SNF;Supervision/Assistance - 24 hour     Equipment Recommendations  Cane    Recommendations for Other Services       Precautions / Restrictions Precautions Precautions: Fall Precaution Comments: L BKA with prosthesis Required Braces or Orthoses: Sling Restrictions RUE Weight Bearing: Non weight bearing    Mobility  Bed Mobility                  Transfers Overall transfer level: Needs assistance Equipment used: None Transfers: Sit to/from Stand Sit to Stand: Min guard         General transfer comment: stands with heavy dependence on LUE pulling on grab bars in bathroom; also quite dependent on momentum, hip flexion to where her trunk is near parallel to the ground, then pushes up to stand  Ambulation/Gait Ambulation/Gait assistance: Min assist Gait Distance (Feet): 15 Feet Assistive device: 1 person hand held assist(and tendency to reach out for UE support ) Gait Pattern/deviations:  Step-to pattern;Wide base of support;Trunk flexed     General Gait Details: pt with short step length with reduced foot clearance and widened BOS. Reaching out for doorways, furniture, and occasionally PT's hand for support   Stairs             Wheelchair Mobility    Modified Rankin (Stroke Patients Only)       Balance     Sitting balance-Leahy Scale: Good       Standing balance-Leahy Scale: Fair                              Cognition Arousal/Alertness: Awake/alert Behavior During Therapy: WFL for tasks assessed/performed Overall Cognitive Status: Impaired/Different from baseline Area of Impairment: Safety/judgement                         Safety/Judgement: Decreased awareness of safety;Decreased awareness of deficits     General Comments: Pt had gotten up to bathroom by herself, and used RUE for a lot of ADL related activiites, including propping on her RUE on counter desptie reminders for NWB      Exercises      General Comments        Pertinent Vitals/Pain Pain Assessment: Faces Faces Pain Scale: Hurts little more Pain Location: R UE Pain Descriptors / Indicators: Aching;Constant Pain Intervention(s): Monitored during session    Home Living  Prior Function            PT Goals (current goals can now be found in the care plan section) Acute Rehab PT Goals Patient Stated Goal: to return to independence PT Goal Formulation: With patient Time For Goal Achievement: 11/26/19 Potential to Achieve Goals: Good Progress towards PT goals: Progressing toward goals    Frequency    Min 2X/week      PT Plan Current plan remains appropriate    Co-evaluation              AM-PAC PT "6 Clicks" Mobility   Outcome Measure  Help needed turning from your back to your side while in a flat bed without using bedrails?: A Little Help needed moving from lying on your back to sitting on the side of a  flat bed without using bedrails?: A Little Help needed moving to and from a bed to a chair (including a wheelchair)?: A Little Help needed standing up from a chair using your arms (e.g., wheelchair or bedside chair)?: A Little Help needed to walk in hospital room?: A Little Help needed climbing 3-5 steps with a railing? : A Lot 6 Click Score: 17    End of Session   Activity Tolerance: Patient tolerated treatment well Patient left: in bed;with call bell/phone within reach;with bed alarm set(Sitting EOB to eat breakfast) Nurse Communication: Mobility status PT Visit Diagnosis: Other abnormalities of gait and mobility (R26.89)     Time: [id number] PT Time Calculation (min) (ACUTE ONLY): 26 min  Charges:  $Gait Training: 8-22 mins $Therapeutic Activity: 8-22 mins                     Roney Marion, PT  Acute Rehabilitation Services Pager [phone] Office [phone]    [name] 11/15/2019, 12:50 PM

## 2019-11-15 NOTE — Plan of Care (Signed)
  Problem: Pain Managment: Goal: General experience of comfort will improve Outcome: Progressing   

## 2019-11-15 NOTE — Plan of Care (Signed)

## 2019-11-15 NOTE — Progress Notes (Signed)
Inpatient Diabetes Program Recommendations  AACE/ADA: New Consensus Statement on Inpatient Glycemic Control (2015)  Target Ranges:  Prepandial:   less than 140 mg/dL      Peak postprandial:   less than 180 mg/dL (1-2 hours)      Critically ill patients:  140 - 180 mg/dL   Lab Results  Component Value Date   GLUCAP 254 (H) 11/15/2019   HGBA1C 9.3 (H) 11/11/2019    Review of Glycemic Control Results for Jennifer [name], Jennifer [name] (MRN [id number]) as of 11/15/2019 12:06  Ref. Range 11/14/2019 12:24 11/14/2019 16:57 11/14/2019 21:22 11/15/2019 06:40  Glucose-Capillary Latest Ref Range: 70 - 99 mg/dL 275 (H) 171 (H) 215 (H) 254 (H)   Diabetes history: DM 2 Outpatient Diabetes medications:  Tresiba 70 units daily, Humalog 0-16 units tid with meals, Metformin XR 500 mg q AM, Ozempic 0.5 mg weekly Current orders for Inpatient glycemic control:  Novolog moderate tid with meals  Lantus 40 units daily Metformin 500 mg q AM  Recommendations:  Please consider increasing Lantus to 50 units daily.  Also consider adding Novolog 4 units tid with meals (hold if patient eats less than 50%).   Thanks  Adah Perl, RN, BC-ADM Inpatient Diabetes Coordinator Pager [phone] (8a-5p)

## 2019-11-16 ENCOUNTER — Telehealth: Payer: Self-pay | Admitting: Orthopedic Surgery

## 2019-11-16 ENCOUNTER — Non-Acute Institutional Stay (SKILLED_NURSING_FACILITY): Payer: Medicare Other | Admitting: Internal Medicine

## 2019-11-16 DIAGNOSIS — Z89512 Acquired absence of left leg below knee: Secondary | ICD-10-CM

## 2019-11-16 DIAGNOSIS — E1159 Type 2 diabetes mellitus with other circulatory complications: Secondary | ICD-10-CM

## 2019-11-16 DIAGNOSIS — Z7409 Other reduced mobility: Secondary | ICD-10-CM | POA: Diagnosis not present

## 2019-11-16 DIAGNOSIS — I5032 Chronic diastolic (congestive) heart failure: Secondary | ICD-10-CM

## 2019-11-16 DIAGNOSIS — Z993 Dependence on wheelchair: Secondary | ICD-10-CM | POA: Diagnosis not present

## 2019-11-16 DIAGNOSIS — J441 Chronic obstructive pulmonary disease with (acute) exacerbation: Secondary | ICD-10-CM

## 2019-11-16 DIAGNOSIS — M75121 Complete rotator cuff tear or rupture of right shoulder, not specified as traumatic: Secondary | ICD-10-CM | POA: Diagnosis not present

## 2019-11-16 DIAGNOSIS — E1151 Type 2 diabetes mellitus with diabetic peripheral angiopathy without gangrene: Secondary | ICD-10-CM | POA: Diagnosis not present

## 2019-11-16 DIAGNOSIS — J42 Unspecified chronic bronchitis: Secondary | ICD-10-CM | POA: Diagnosis not present

## 2019-11-16 DIAGNOSIS — E1169 Type 2 diabetes mellitus with other specified complication: Secondary | ICD-10-CM | POA: Diagnosis not present

## 2019-11-16 DIAGNOSIS — M75111 Incomplete rotator cuff tear or rupture of right shoulder, not specified as traumatic: Secondary | ICD-10-CM | POA: Diagnosis not present

## 2019-11-16 DIAGNOSIS — E08319 Diabetes mellitus due to underlying condition with unspecified diabetic retinopathy without macular edema: Secondary | ICD-10-CM | POA: Diagnosis not present

## 2019-11-16 DIAGNOSIS — I152 Hypertension secondary to endocrine disorders: Secondary | ICD-10-CM

## 2019-11-16 DIAGNOSIS — Z7689 Persons encountering health services in other specified circumstances: Secondary | ICD-10-CM | POA: Diagnosis not present

## 2019-11-16 DIAGNOSIS — E1121 Type 2 diabetes mellitus with diabetic nephropathy: Secondary | ICD-10-CM

## 2019-11-16 DIAGNOSIS — I10 Essential (primary) hypertension: Secondary | ICD-10-CM

## 2019-11-16 LAB — GLUCOSE, CAPILLARY: Glucose-Capillary: 206 mg/dL — ABNORMAL HIGH (ref 70–99)

## 2019-11-16 MED ORDER — INSULIN GLARGINE 100 UNIT/ML ~~LOC~~ SOLN
50.0000 [IU] | Freq: Every day | SUBCUTANEOUS | Status: DC
  Filled 2019-11-16: qty 0.5

## 2019-11-16 NOTE — Progress Notes (Signed)
PT[facility] arrived to unit to transport pt. Per PTAR, will not transfer pt's personal wheelchair. Pt called her father and asked him to pick it up from [facility], pt's father agreed.

## 2019-11-16 NOTE — Plan of Care (Signed)

## 2019-11-16 NOTE — Progress Notes (Signed)
Having breakfast arm out of sling. Has been hesitant to work with PT/OT , but using arm  VSS afebrile right arm distally NV intact Encouraged Pendulums. Hopefully discharge to SNF today

## 2019-11-16 NOTE — Telephone Encounter (Signed)
Left a message with Rosendo Gros. She can do pendulum excercises and ROM as tolerated. Also may bear weight as tolerated

## 2019-11-16 NOTE — Progress Notes (Signed)
Discharge packet given to PTAR for receiving facility. Pt not in distress and tolerated well.

## 2019-11-16 NOTE — Discharge Summary (Signed)
, Discharge Diagnoses:  Active Problems:   Impingement syndrome of right shoulder   Nontraumatic incomplete tear of right rotator cuff   Pain in joint of right shoulder   Surgeries: Procedure(s): RIGHT SHOULDER ARTHROSCOPY AND DEBRIDEMENT on 11/11/2019    Consultants:   Discharged Condition: Improved  Hospital Course: Jennifer [name] is an 58 y.o. female who was admitted 11/11/2019 with a chief complaint of  ZRight shoulder Pain, with a final diagnosis of Right Shoulder Rotator Cuff Tear.  Patient was brought to the operating room on 11/11/2019 and underwent Procedure(s): RIGHT SHOULDER ARTHROSCOPY AND DEBRIDEMENT.    Patient was given perioperative antibiotics:  Anti-infectives (From admission, onward)   Start     Dose/Rate Route Frequency Ordered Stop   11/11/19 1600  ceFAZolin (ANCEF) IVPB 1 g/50 mL premix     1 g 100 mL/hr over 30 Minutes Intravenous Every 6 hours 11/11/19 1550 11/12/19 0445   11/11/19 0715  ceFAZolin (ANCEF) IVPB 2g/100 mL premix     2 g 200 mL/hr over 30 Minutes Intravenous On call to O.R. 11/11/19 [id number] 11/11/19 0940    .  Patient was given sequential compression devices, early ambulation, and aspirin for DVT prophylaxis.  Recent vital signs:  Patient Vitals for the past 24 hrs:  BP Temp Temp src Pulse Resp SpO2  11/16/19 0839 -- -- -- 78 18 --  11/16/19 0813 105/70 98.1 F (36.7 C) Oral 77 -- 98 %  11/16/19 0417 (!) 145/66 98.2 F (36.8 C) Oral 70 16 90 %  11/15/19 0919 (!) 145/75 -- -- 75 -- --  11/15/19 0900 -- -- -- 74 18 --  .  Recent laboratory studies: No results found.  Discharge Medications:   Allergies as of 11/16/2019      Reactions   Iohexol Other (See Comments)    Desc: IV CONTRAST CAUSE NEPHROPATHY IN 2007   Ivp Dye [iodinated Diagnostic Agents] Other (See Comments)   Desc: IV CONTRAST CAUSE NEPHROPATHY IN 2007   Abilify [aripiprazole] Other (See Comments)   Urinary freq Nov 2016   Morphine Sulfate Itching, Rash       Medication List    TAKE these medications   albuterol (2.5 MG/3ML) 0.083% nebulizer solution Commonly known as: PROVENTIL INHALE THE CONTENTS OF 1 VIAL VIA NEBULIZER EVERY 6 HOURS AS NEEDED FOR WHEEZING What changed: See the new instructions.   ProAir HFA 108 (90 Base) MCG/ACT inhaler Generic drug: albuterol INHALE 2 PUFFS BY MOUTH EVERY 6 HOURS AS NEEDED FOR WHEEZING What changed: See the new instructions.   amLODipine 10 MG tablet Commonly known as: NORVASC Take 1 tablet (10 mg total) by mouth daily.   Anoro Ellipta 62.5-25 MCG/INH Aepb Generic drug: umeclidinium-vilanterol TAKE 1 PUFF BY MOUTH EVERY DAY What changed: See the new instructions.   aspirin EC 81 MG tablet Take 81 mg by mouth daily.   atenolol 100 MG tablet Commonly known as: TENORMIN TAKE 1 TABLET BY MOUTH EVERY DAY   baclofen 10 MG tablet Commonly known as: LIORESAL TAKE 1 TABLET BY MOUTH THREE TIMES A DAY AS NEEDED FOR MUSCLE SPASMS What changed: See the new instructions.   benazepril 40 MG tablet Commonly known as: LOTENSIN Take 1 tablet (40 mg total) by mouth daily.   benzonatate 100 MG capsule Commonly known as: Tessalon Perles Take 1 capsule (100 mg total) by mouth at bedtime as needed for cough.   betamethasone dipropionate 0.05 % cream Apply 1 application topically 2 (two) times daily as needed (  irritation).   Biotin 5 MG Tabs Take 5 mg by mouth daily.   buPROPion 300 MG 24 hr tablet Commonly known as: WELLBUTRIN XL TAKE 1 TABLET BY MOUTH DAILY   cholecalciferol 25 MCG (1000 UT) tablet Commonly known as: VITAMIN D3 Take 1,000 Units by mouth daily.   diphenhydrAMINE 25 mg capsule Commonly known as: BENADRYL Take 1 capsule (25 mg total) by mouth every 6 (six) hours as needed for itching, allergies or sleep.   Fish Oil 1000 MG Caps Take 1,000 mg by mouth daily.   fluticasone 50 MCG/ACT nasal spray Commonly known as: FLONASE INSTILL 1 SPRAY IN EACH NOSTRIL DAILY What changed:    how much to take  how to take this  when to take this  reasons to take this  additional instructions   furosemide 40 MG tablet Commonly known as: LASIX TAKE ONE TABLET BY MOUTH DAILY AS NEEDED What changed: reasons to take this   hydrochlorothiazide 25 MG tablet Commonly known as: HYDRODIURIL TAKE 1 TABLET BY MOUTH ONCE DAILY   hydrOXYzine 25 MG capsule Commonly known as: VISTARIL Take 2 capsules (50 mg total) by mouth daily.   insulin lispro 100 UNIT/ML KwikPen Commonly known as: HumaLOG KwikPen If blood sugar is < 175 Do not take any correction insulin. 176-225 inject 2 units. 226-275 inject 4 units. [id number] inject 6 units. 326-375 inject 8 units. 376-425 inject 10 units. 426-475 inject 12 units. [id number] inject 14 units and call office for further instructions. What changed:   how much to take  how to take this  when to take this  additional instructions   Insulin Pen Needle 32G X 4 MM Misc Use to inject insulin 4 times a day and semaglutide once weekly   Insulin Syringe-Needle U-100 31G X 15/64" 0.3 ML Misc Use with Humalog to correct blood sugar before meals three times a day   ipratropium 0.02 % nebulizer solution Commonly known as: ATROVENT USE 1 VIAL VIA NEBULIZER EVERY 6 HOURS AS NEEDED FOR WHEEZING   metFORMIN 500 MG 24 hr tablet Commonly known as: GLUCOPHAGE-XR TAKE 1 TABLET BY MOUTH EVERY DAY WITH BREAKFAST What changed: See the new instructions.   multivitamin with minerals Tabs tablet Take 1 tablet by mouth daily.   Myrbetriq 50 MG Tb24 tablet Generic drug: mirabegron ER TAKE 1 TABLET BY MOUTH DAILY   nystatin powder Commonly known as: nystatin APPLY TO AFFECTED AREA THREE TIMES A DAY What changed:   how much to take  how to take this  when to take this   omeprazole 20 MG capsule Commonly known as: PRILOSEC TAKE 1 CAPSULE (20 MG TOTAL) BY MOUTH 2 (TWO) TIMES DAILY BEFORE A MEAL.   OneTouch Verio test strip Generic drug:  glucose blood USE TO TEST BLOOD SUGAR 3-4 TIMES DAILY   oxyCODONE-acetaminophen 5-325 MG tablet Commonly known as: Percocet Take 1 tablet by mouth every 4 (four) hours as needed for severe pain. What changed:   how much to take  how to take this  when to take this  reasons to take this  additional instructions   oxyCODONE-acetaminophen 5-325 MG tablet Commonly known as: Percocet Take 1 tablet by mouth every 4 (four) hours as needed for severe pain. What changed: You were already taking a medication with the same name, and this prescription was added. Make sure you understand how and when to take each.   Ozempic (0.25 or 0.5 MG/DOSE) 2 MG/1.5ML Sopn Generic drug: Semaglutide(0.25 or 0.5MG /DOS) INJECT 0.5 MG  INTO THE SKIN ONCE A WEEK.   pregabalin 200 MG capsule Commonly known as: LYRICA TAKE 1 CAPSULE BY MOUTH THREE TIMES A DAY What changed: See the new instructions.   rosuvastatin 20 MG tablet Commonly known as: CRESTOR TAKE 1 TABLET BY MOUTH AT BEDTIME What changed: when to take this   traZODone 100 MG tablet Commonly known as: DESYREL Take 100 mg by mouth at bedtime as needed for sleep.   [name] FlexTouch 200 UNIT/ML Sopn Generic drug: Insulin Degludec Inject 70 Units into the skin daily.   vitamin B-12 500 MCG tablet Commonly known as: CYANOCOBALAMIN Take 500 mcg by mouth daily.   vitamin C 500 MG tablet Commonly known as: ASCORBIC ACID Take 500 mg by mouth daily.       Diagnostic Studies: No results found.  Patient benefited maximally from their hospital stay and there were no complications.     Disposition: Discharge disposition: 03-Skilled Nursing Facility      Discharge Instructions    Call MD / Call 911   Complete by: As directed    If you experience chest pain or shortness of breath, CALL 911 and be transported to the hospital emergency room.  If you develope a fever above 101 F, pus (white drainage) or increased drainage or redness at  the wound, or calf pain, call your surgeon's office.   Call MD / Call 911   Complete by: As directed    If you experience chest pain or shortness of breath, CALL 911 and be transported to the hospital emergency room.  If you develope a fever above 101 F, pus (white drainage) or increased drainage or redness at the wound, or calf pain, call your surgeon's office.   Call MD / Call 911   Complete by: As directed    If you experience chest pain or shortness of breath, CALL 911 and be transported to the hospital emergency room.  If you develope a fever above 101 F, pus (white drainage) or increased drainage or redness at the wound, or calf pain, call your surgeon's office.   Call MD / Call 911   Complete by: As directed    If you experience chest pain or shortness of breath, CALL 911 and be transported to the hospital emergency room.  If you develope a fever above 101 F, pus (white drainage) or increased drainage or redness at the wound, or calf pain, call your surgeon's office.   Call MD / Call 911   Complete by: As directed    If you experience chest pain or shortness of breath, CALL 911 and be transported to the hospital emergency room.  If you develope a fever above 101 F, pus (white drainage) or increased drainage or redness at the wound, or calf pain, call your surgeon's office.   Call MD / Call 911   Complete by: As directed    If you experience chest pain or shortness of breath, CALL 911 and be transported to the hospital emergency room.  If you develope a fever above 101 F, pus (white drainage) or increased drainage or redness at the wound, or calf pain, call your surgeon's office.   Call MD / Call 911   Complete by: As directed    If you experience chest pain or shortness of breath, CALL 911 and be transported to the hospital emergency room.  If you develope a fever above 101 F, pus (white drainage) or increased drainage or redness at the wound, or calf  pain, call your surgeon's office.    Constipation Prevention   Complete by: As directed    Drink plenty of fluids.  Prune juice may be helpful.  You may use a stool softener, such as Colace (over the counter) 100 mg twice a day.  Use MiraLax (over the counter) for constipation as needed.   Constipation Prevention   Complete by: As directed    Drink plenty of fluids.  Prune juice may be helpful.  You may use a stool softener, such as Colace (over the counter) 100 mg twice a day.  Use MiraLax (over the counter) for constipation as needed.   Constipation Prevention   Complete by: As directed    Drink plenty of fluids.  Prune juice may be helpful.  You may use a stool softener, such as Colace (over the counter) 100 mg twice a day.  Use MiraLax (over the counter) for constipation as needed.   Constipation Prevention   Complete by: As directed    Drink plenty of fluids.  Prune juice may be helpful.  You may use a stool softener, such as Colace (over the counter) 100 mg twice a day.  Use MiraLax (over the counter) for constipation as needed.   Constipation Prevention   Complete by: As directed    Drink plenty of fluids.  Prune juice may be helpful.  You may use a stool softener, such as Colace (over the counter) 100 mg twice a day.  Use MiraLax (over the counter) for constipation as needed.   Constipation Prevention   Complete by: As directed    Drink plenty of fluids.  Prune juice may be helpful.  You may use a stool softener, such as Colace (over the counter) 100 mg twice a day.  Use MiraLax (over the counter) for constipation as needed.   Constipation Prevention   Complete by: As directed    Drink plenty of fluids.  Prune juice may be helpful.  You may use a stool softener, such as Colace (over the counter) 100 mg twice a day.  Use MiraLax (over the counter) for constipation as needed.   Diet - low sodium heart healthy   Complete by: As directed    Diet - low sodium heart healthy   Complete by: As directed    Diet - low sodium  heart healthy   Complete by: As directed    Diet - low sodium heart healthy   Complete by: As directed    Diet - low sodium heart healthy   Complete by: As directed    Diet - low sodium heart healthy   Complete by: As directed    Diet - low sodium heart healthy   Complete by: As directed    Discharge instructions   Complete by: As directed    Keep dressing clean and dry. Follow up 1 week Dr. Sharol Given   Discharge instructions   Complete by: As directed    Right shoulder sling for comfort, remove the sling and perform range of motion exercises 3-4 times daily. Ice packs to the right shoulder 20-30 min on and 30 min off as needed for pain and swelling. Narcotics for pain control Keep incisions dry  For 5 days then my bathe and change dressings with bandaids.  Return to see Dr. Sharol Given in 2 weeks.  Call if fever greater than 101.5. If shortness of breath of fever greater than 102 go to the ER for evaluation. Practice deep breathing and incentive spirometry every hour  when awake for 3--5 minutes. Move your right elbow and right wrist hand and fingers to prevent stiffness.   Discharge instructions   Complete by: As directed    Elevate R upper extremity .   Increase activity slowly as tolerated   Complete by: As directed    Increase activity slowly as tolerated   Complete by: As directed    Increase activity slowly as tolerated   Complete by: As directed    Increase activity slowly as tolerated   Complete by: As directed    Increase activity slowly as tolerated   Complete by: As directed    Increase activity slowly as tolerated   Complete by: As directed    Increase activity slowly as tolerated   Complete by: As directed       Contact information for follow-up providers    Newt Minion, MD Follow up in 1 week(s).   Specialty: Orthopedic Surgery Contact information: [place] [address] [phone]            Contact information for after-discharge care     Destination    [facility] Preferred SNF .   Service: Skilled Nursing Contact information: [address] [place] [phone]                   Signed: Bevely Palmer Sheyenne Konz 11/16/2019, 8:51 AM

## 2019-11-16 NOTE — Telephone Encounter (Signed)
Received call from Stacey Street with [facility] needing weight bearing and range of motion status. The number to contact Rosendo Gros is [phone]

## 2019-11-16 NOTE — Telephone Encounter (Signed)
Can you please see below and advise?  

## 2019-11-16 NOTE — Progress Notes (Signed)
This is an acute visit.  Level care skilled facility is [facility].  Chief complaint acute visit status post admission to skilled nursing after hospitalization for a right shoulder rotator cuff repair.  History of present illness.  Patient is a 58 year old female who is here for rehab after undergoing a right shoulder rotator cuff repair with arthroplasty and debridement.  She apparently tolerated procedure well and is here for short-term rehab.  Her other issues include diastolic CHF thought to be stable she is not on a diuretic currently had been on Lasix intermittently apparently before.  She also has a history of diabetes on numerous medications including Tresiba Glucophage Humalog sliding scale and Ozempic.  She also has a history of hypertension and continues on Norvasc as well as Benadryl atenolol and hydrochlorothiazide.  She does have a history of of peripheral vascular disease with gangrene of her left lower extremity  and is status post left below-the-knee amputation.  She was also hospitalized about a month ago for pneumonia exacerbated with her COPD she responded to broad-spectrum antibiotics and prednisone and nebulizers  Patient is very motivated to go home but apparently it was thought she would benefit from short-term rehab here before going home.  .  Currently she does not really have any complaints of pain or discomfort-but does states she really would like to get home as soon as possible.    Previous medical history   Diagnosis Date  . Anginal pain [facility])    '3' of 10 ischemia ruled out 9/9   . Arthritis of lumbar spine   . Asthma   . Cataract   . CHF (congestive heart failure) ([place])   . 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 Chronic bronchitis ([place])    "I get it alot" (09/28/2013)  . Chronic diastolic heart failure (HCC)    grade 2 per 2D echocardiogram (01/2013)  . Chronic lower back pain   . Chronic osteomyelitis of foot (HCC)     chronic, right secondary to diabetic foot ulcers  . Chronic pain syndrome 12/03/2011   Likely secondary to depression, "fibromyalgia", neuropathy, and obesity. Lumbar MRI 2014 no sig change from prior (2008) : Stable hypertrophic facet disease most notable at L4-5. Stable shallow left foraminal/extraforaminal disc protrusion at L4-5. No direct neural compression.   Marland Kitchen COPD 01/08/2007   PFT's 05/2007 : FEV1/FVC 82, FEV1 64% pred, FEF 25-75% 40% predicted, 16% improvement in FEV1 with bronchodilators.   . Depression   . Diabetes mellitus without complication ([place])    Type II  . Diabetic peripheral neuropathy ([place])   . DVT of upper extremity (deep vein thrombosis) ([place]) 03/11/2013   Secondary to PICC line. Right brachial vein, diagnosed on 03/10/2013 Coumadin for 3 months. End date 06/10/2013   . Dyspnea    "smoker"  . Environmental allergies    Hx: of  . Exertional shortness of breath   . Fatty liver 2003   observed on ultrasound abdomen  . Fibromyalgia   . GERD (gastroesophageal reflux disease)   . Glaucoma   . History of use of hearing aid   . Hyperlipidemia   . Hyperplastic colon polyp 12/2010   Per colonoscopy (12/2010) - Dr. Deatra Ina  . Hypertension   . 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 Blood cultures 01/26/13 S. Aureus and GBS. Blood cultures Feb 6th, 8th, and 9th and March were negative.Repeat TEE 3/20 negative for vegitations  . Lower  limb amputation, below knee 2/2 chronic osteomyelitis    Oct 2014 L - failed limp preserving treatment. 2/2 tobacco use, DM, and cont weight bearing on surgical wound and developed gangrene   . Pneumonia   . Polymicrobial bacterial infection 01/2013   GBS and S. aureus bacteremia // Source likely infected diabetic foot ulcer  . PVD (peripheral vascular disease) with claudication ([place])    Stents to bilateral common iliac arteries (left 2005, right 2008), on chronic plavix  . Rheumatoid  arthritis ([place])   . S/P BKA (below knee amputation) unilateral [facility])    Oct 2014 L - failed limb preserving treatment. 2/2 tobacco use, DM, and cont weight bearing on surgical wound and developed gangrene   . Tobacco abuse   . Type II diabetes mellitus with peripheral circulatory disorders, uncontrolled DX: 1993   Insulin dep. Poor control. Complicated by diabetic foot ulcer and diabetic eye disease.   Marland Kitchen Ulcer of foot, chronic (HCC)    Left. No OM per MRI (01/2013)        Past Surgical History:  Procedure Laterality Date  . ABDOMINAL HYSTERECTOMY  1997   secondary to uterine fibroids  . AMPUTATION Left 08/31/2013   Procedure: AMPUTATION RAY; Surgeon: Newt Minion, MD; Location: [place]; Service: Orthopedics; Laterality: Left; Left Foot 5th Ray Amputation  . AMPUTATION Left 09/28/2013   Procedure: Left Midfoot amputation; Surgeon: Newt Minion, MD; Location: [place]; Service: Orthopedics; Laterality: Left; Left Midfoot amputation  . AMPUTATION Left 10/14/2013   Procedure: AMPUTATION BELOW KNEE- left; Surgeon: Newt Minion, MD; Location: [place]; Service: Orthopedics; Laterality: Left; Left Below Knee Amputation   . AMPUTATION TOE Right 01/15/2017   Procedure: AMPUTATION 5th TOE RIGHT FOOT; Surgeon: Edrick Kins, DPM; Location: [place]; Service: Podiatry; Laterality: Right;  . APPLICATION OF WOUND VAC  04/01/2019   Procedure: Application Of Wound Vac; Surgeon: Newt Minion, MD; Location: [place]; Service: Orthopedics;;  . BLADDER SURGERY     bladder reconstruction surgery  . BREAST BIOPSY     multiple-benign per pt  . COLONOSCOPY    . ESOPHAGOGASTRODUODENOSCOPY N/A 09/20/2013   Procedure: ESOPHAGOGASTRODUODENOSCOPY (EGD); Surgeon: Jerene Bears, MD; Location: [place]; Service: Gastroenterology; Laterality: N/A;  . FOOT AMPUTATION THROUGH METATARSAL Left 09/28/2013  . GANGLION CYST EXCISION     multiple  . PERIPHERAL VASCULAR INTERVENTION     stents in lower ext  . SHOULDER ARTHROSCOPY W/  ROTATOR CUFF REPAIR Bilateral   . SKIN SPLIT GRAFT Bilateral 05/13/2013   Procedure: Right and Left Foot Allograft Skin Graft; Surgeon: Newt Minion, MD; Location: [place]; Service: Orthopedics; Laterality: Bilateral; Right and Left Foot Allograft Skin Graft  . STUMP REVISION Left 04/01/2019   Procedure: REVISION LEFT BELOW KNEE AMPUTATION; Surgeon: Newt Minion, MD; Location: [place]; Service: Orthopedics; Laterality: Left;  . TEE WITHOUT CARDIOVERSION N/A 01/31/2013   Procedure: TRANSESOPHAGEAL ECHOCARDIOGRAM (TEE); Surgeon: Fay Records, MD; Location: [place]; Service: Cardiovascular; Laterality: N/A; Rm [phone]  . TEE WITHOUT CARDIOVERSION N/A 03/10/2013   Procedure: TRANSESOPHAGEAL ECHOCARDIOGRAM (TEE); Surgeon: Larey Dresser, MD; Location: [place]; Service: Cardiovascular; Laterality: N/A; Rm. [phone]  . TOE AMPUTATION Left 08/31/2013   4TH & 5 TH TOE   . TONSILLECTOMY    . TUBAL LIGATION    . WRIST SURGERY Right    "for tumors" (09/28/2013)        Family History  Problem Relation Age of Onset  . Diverticulosis Mother   . Diabetes Mother   .  Hypertension Mother   . Congestive Heart Failure Mother   . Asthma Father   . CAD Sister 80   MI at age 30 per patient. However, she has not had a stent or CABG.   . Heart disease Sister    before age 23  . Breast cancer Neg Hx   Social History: reports that she has been smoking cigarettes. She has a 50.00 pack-year smoking history. She has never used smokeless tobacco. She reports that she does not drink alcohol or use drugs.  Allergies:       Allergies  Allergen Reactions  . Iohexol Other (See Comments)    Desc: IV CONTRAST CAUSE NEPHROPATHY IN 2007   . Ivp Dye [Iodinated Diagnostic Agents] Other (See Comments)    Desc: IV CONTRAST CAUSE NEPHROPATHY IN 2007  . Abilify [Aripiprazole] Other (See Comments)    Urinary freq Nov 2016  . Morphine Sulfate Itching and Rash              MEDICATIONS      TAKE these medications     albuterol (2.5 MG/3ML) 0.083% nebulizer solution Commonly known as: PROVENTIL INHALE THE CONTENTS OF 1 VIAL VIA NEBULIZER EVERY 6 HOURS AS NEEDED FOR WHEEZING What changed: See the new instructions.   ProAir HFA 108 (90 Base) MCG/ACT inhaler Generic drug: albuterol INHALE 2 PUFFS BY MOUTH EVERY 6 HOURS AS NEEDED FOR WHEEZING What changed: See the new instructions.   amLODipine 10 MG tablet Commonly known as: NORVASC Take 1 tablet (10 mg total) by mouth daily.   Anoro Ellipta 62.5-25 MCG/INH Aepb Generic drug: umeclidinium-vilanterol TAKE 1 PUFF BY MOUTH EVERY DAY What changed: See the new instructions.   aspirin EC 81 MG tablet Take 81 mg by mouth daily.   atenolol 100 MG tablet Commonly known as: TENORMIN TAKE 1 TABLET BY MOUTH EVERY DAY   baclofen 10 MG tablet Commonly known as: LIORESAL TAKE 1 TABLET BY MOUTH THREE TIMES A DAY AS NEEDED FOR MUSCLE SPASMS What changed: See the new instructions.   benazepril 40 MG tablet Commonly known as: LOTENSIN Take 1 tablet (40 mg total) by mouth daily.   benzonatate 100 MG capsule Commonly known as: Tessalon Perles Take 1 capsule (100 mg total) by mouth at bedtime as needed for cough.   betamethasone dipropionate 0.05 % cream Apply 1 application topically 2 (two) times daily as needed (irritation).   Biotin 5 MG Tabs Take 5 mg by mouth daily.   buPROPion 300 MG 24 hr tablet Commonly known as: WELLBUTRIN XL TAKE 1 TABLET BY MOUTH DAILY   cholecalciferol 25 MCG (1000 UT) tablet Commonly known as: VITAMIN D3 Take 1,000 Units by mouth daily.   diphenhydrAMINE 25 mg capsule Commonly known as: BENADRYL Take 1 capsule (25 mg total) by mouth every 6 (six) hours as needed for itching, allergies or sleep.   Fish Oil 1000 MG Caps Take 1,000 mg by mouth daily.   fluticasone 50 MCG/ACT nasal spray Commonly known as: FLONASE INSTILL 1 SPRAY IN EACH NOSTRIL DAILY What changed:   how much to take  how to take  this  when to take this  reasons to take this  additional instructions       hydrochlorothiazide 25 MG tablet Commonly known as: HYDRODIURIL TAKE 1 TABLET BY MOUTH ONCE DAILY   hydrOXYzine 25 MG capsule Commonly known as: VISTARIL Take 2 capsules (50 mg total) by mouth daily.   insulin lispro 100 UNIT/ML KwikPen Commonly known as: HumaLOG KwikPen  If blood sugar is < 175 Do not take any correction insulin. 176-225 inject 2 units. 226-275 inject 4 units. [id number] inject 6 units. 326-375 inject 8 units. 376-425 inject 10 units. 426-475 inject 12 units. [id number] inject 14 units and call office for further instructions. What changed:   how much to take  how to take this  when to take this  additional instructions   Insulin Pen Needle 32G X 4 MM Misc Use to inject insulin 4 times a day and semaglutide once weekly   Insulin Syringe-Needle U-100 31G X 15/64" 0.3 ML Misc Use with Humalog to correct blood sugar before meals three times a day   ipratropium 0.02 % nebulizer solution Commonly known as: ATROVENT USE 1 VIAL VIA NEBULIZER EVERY 6 HOURS AS NEEDED FOR WHEEZING   metFORMIN 500 MG 24 hr tablet Commonly known as: GLUCOPHAGE-XR TAKE 1 TABLET BY MOUTH EVERY DAY WITH BREAKFAST What changed: See the new instructions.   multivitamin with minerals Tabs tablet Take 1 tablet by mouth daily.   Myrbetriq 50 MG Tb24 tablet Generic drug: mirabegron ER TAKE 1 TABLET BY MOUTH DAILY   nystatin powder Commonly known as: nystatin APPLY TO AFFECTED AREA THREE TIMES A DAY What changed:   how much to take  how to take this  when to take this   omeprazole 20 MG capsule Commonly known as: PRILOSEC TAKE 1 CAPSULE (20 MG TOTAL) BY MOUTH 2 (TWO) TIMES DAILY BEFORE A MEAL.   OneTouch Verio test strip Generic drug: glucose blood USE TO TEST BLOOD SUGAR 3-4 TIMES DAILY   oxyCODONE-acetaminophen 5-325 MG tablet Commonly known as: Percocet Take 1 tablet by mouth  every 4 (four) hours as needed for severe pain. What changed:   how much to take  how to take this  when to take this  reasons to take this  additional instructions   oxyCODONE-acetaminophen 5-325 MG tablet Commonly known as: Percocet Take 1 tablet by mouth every 4 (four) hours as needed for severe pain. What changed: You were already taking a medication with the same name, and this prescription was added. Make sure you understand how and when to take each.   Ozempic (0.25 or 0.5 MG/DOSE) 2 MG/1.5ML Sopn Generic drug: Semaglutide(0.25 or 0.5MG /DOS) INJECT 0.5 MG INTO THE SKIN ONCE A WEEK.   pregabalin 200 MG capsule Commonly known as: LYRICA TAKE 1 CAPSULE BY MOUTH THREE TIMES A DAY What changed: See the new instructions.   rosuvastatin 20 MG tablet Commonly known as: CRESTOR TAKE 1 TABLET BY MOUTH AT BEDTIME What changed: when to take this   traZODone 100 MG tablet Commonly known as: DESYREL Take 100 mg by mouth at bedtime as needed for sleep.   [name] FlexTouch 200 UNIT/ML Sopn Generic drug: Insulin Degludec Inject 70 Units into the skin daily.   vitamin B-12 500 MCG tablet Commonly known as: CYANOCOBALAMIN Take 500 mcg by mouth daily.   vitamin C 500 MG tablet Commonly known as: ASCORBIC ACID Take 500 mg by mouth daily.       Review of systems.  In general she is not complaining of any fever or chills.  Skin does not complain of rashes or itching.  Head ears eyes nose mouth and throat is not complaining of visual changes or sore throat.  Respiratory does not complain of being short of breath or having a cough.  Cardiac does not complain of chest pain has what she describes as chronic lower extremity edema she describes as baseline.  GI is not complaining of abdominal discomfort nausea vomiting diarrhea or constipation.  GU is not complaining of dysuria.  Musculoskeletal does not complain of acute shoulder pain but with any palpation of  the areas she appears to have some discomfort-does not complain of joint pain otherwise.  Neurologic does not complain of dizziness headache numbness or syncope.  And psych does not complain of being depressed or overly anxious although she is quite adamant she would like to get home as soon as possible.  Physical exam.  She is afebrile pulse is 76 respirations of 19 blood pressure 118/70  This is a well-nourished 58 year old female in no distress.  Her skin is warm and dry she does have some small dressings applied to surgical site right shoulder there is no evidence of excessive bleeding or bruising.  Eyes visual acuity appears to be intact sclera and conjunctive are clear.  Oropharynx is clear mucous membranes moist.  Chest is clear to auscultation there is no labored breathing.  Heart is regular rate and rhythm without murmur gallop or rub she has moderate lower extremity edema she states this is baseline.  Abdomen is somewhat obese soft nontender with positive bowel sounds.  Musculoskeletal she is status post left BKA prosthesis is in place-- she does have her right arm in a sling-- surgical site has small areas of dressing as noted above-grip strength bilaterally appears to be intact-- radial pulse is intact on the right-- capillary refill is intact as well  Neurologic is grossly intact and sensation appears to be intact her speech is clear could not appreciate lateralizing findings.  Psych she is alert and oriented pleasant and appropriate.  Labs.  11/11/2019.  Sodium 139 potassium 4.1 BUN 12 creatinine 0.88.  WBC 9.0 hemoglobin 14.1 platelets 148.  Assessment and plan.  1. History of right-shoulder rotator cuff tear with repair she appears to have tolerated the procedure well-she will need PT and OT she has expressed desires to go home soon as possible at this point will monitor and encourage therapy.  For pain she continues on Percocet 5-325 mg every 4 hours as  needed she also has orders for Lyrica 200 mg 3 times daily I suspect this is also for diabetic neuropathy. She does have orders for baclofen 10 mg 3 times daily as needed for muscle spasms  2.-History of diastolic CHF thought to be stable-has been on Lasix at times as needed it appears-at this point continue to monitor weights notify provider if gain greater than 3 pounds or any increased edema at this point appears compensated.--She states her edema is baseline and possibly improved  3. History of diabetes type 2 she is on Tresiba 70 units-Glucophage 500 mg twice daily-Ozempic 0.5 mg q. Weekly\-she does have Humalog sliding scale at this point will continue to monitor it seems her blood sugars were in the 200s in the hospital-hemoglobin A1c was 9.3 back on November 20.  She states she did not get her weekly injection of Ozempic this morning she usually receives these on Wednesdays-I did discuss this with nursing and they will try to obtain this expediently and administer on a weekly basis  4.-History of coronary artery disease at this point appears to be stable she is on a statin Crestor 20 mg a day as well as aspirin 81 mg a day.  5. History of hypertension at this point will monitor she is on Norvasc 10 mg a day benazepril 40 mg a day and atenolol 100 mg daily in  addition to hydrochlorothiazide 25 mg a day.  6. Depression she continues on Wellbutrin XL 300 mg a day at this point will monitor.  7. History of apparent urinary issues she is on Myrbetriq 50 mg a day.  8. History of itching she is on Vistaril 50 mg daily and has an order for Benadryl as needed  9.-History of GERD she continues on Prilosec 20 mg twice a day at this point appears to be stable.  10. History of COPD with recent pneumonia she is on numerous agents including ProAir as well as albuterol every 6 hours as needed-she also is on Ellipta daily Atrovent as needed.  11. History of insomnia she continues on trazodone 100 mg  nightly as needed.  Blood work in hospital appear to be stable but will warrant updating first laboratory day next week will obtain a CBC and metabolic panel.  [id number] note greater than 45 minutes spent assessing patient-reviewing her chart and labs-discussing her status with nursing staff-and coordinating and formulating a plan of care for numerous diagnoses-of note greater than 50% of time spent coordinating a plan of care with input as noted above

## 2019-11-16 NOTE — TOC Transition Note (Signed)
Transition of Care [facility]) - CM/SW Discharge Note   Patient Details  Name: Jennifer [name] MRN: [id number] Date of Birth: May 14, 1961  [facility] CM/SW Contact:  Atilano Median, LCSW Phone Number: 11/16/2019, 9:06 AM   Clinical Narrative:    Discharged to SNF. Referral coordinated with Lexine Baton [phone]. Patient aware and agreeable to this plan. Number to call report [phone] given to unit RN Neva. No other needs at this time. Case closed to this CSW.    Final next level of care: Skilled Nursing Facility Barriers to Discharge: Barriers Resolved   Patient Goals and CMS Choice Patient states their goals for this hospitalization and ongoing recovery are:: to be able to go home and get strength back CMS Medicare.gov Compare Post Acute Care list provided to:: Patient Choice offered to / list presented to : Patient  Discharge Placement PASRR number recieved: 11/15/19            Patient chooses bed at: [facility] Patient to be transferred to facility by: [place] Name of family member notified: Patient Patient and family notified of of transfer: 11/16/19  Discharge Plan and Services   Discharge Planning Services: CM Consult                                 Social Determinants of Health ([place]) Interventions     Readmission Risk Interventions No flowsheet data found.

## 2019-11-17 ENCOUNTER — Encounter: Payer: Self-pay | Admitting: Internal Medicine

## 2019-11-18 ENCOUNTER — Encounter: Payer: Self-pay | Admitting: Internal Medicine

## 2019-11-21 ENCOUNTER — Telehealth: Payer: Self-pay | Admitting: Orthopedic Surgery

## 2019-11-21 ENCOUNTER — Non-Acute Institutional Stay (SKILLED_NURSING_FACILITY): Payer: Medicare Other | Admitting: Internal Medicine

## 2019-11-21 DIAGNOSIS — M7541 Impingement syndrome of right shoulder: Secondary | ICD-10-CM

## 2019-11-21 DIAGNOSIS — E08319 Diabetes mellitus due to underlying condition with unspecified diabetic retinopathy without macular edema: Secondary | ICD-10-CM

## 2019-11-21 DIAGNOSIS — Z79891 Long term (current) use of opiate analgesic: Secondary | ICD-10-CM

## 2019-11-21 DIAGNOSIS — E785 Hyperlipidemia, unspecified: Secondary | ICD-10-CM

## 2019-11-21 DIAGNOSIS — M75111 Incomplete rotator cuff tear or rupture of right shoulder, not specified as traumatic: Secondary | ICD-10-CM

## 2019-11-21 DIAGNOSIS — E1169 Type 2 diabetes mellitus with other specified complication: Secondary | ICD-10-CM | POA: Diagnosis not present

## 2019-11-21 DIAGNOSIS — E0865 Diabetes mellitus due to underlying condition with hyperglycemia: Secondary | ICD-10-CM

## 2019-11-21 DIAGNOSIS — E1159 Type 2 diabetes mellitus with other circulatory complications: Secondary | ICD-10-CM

## 2019-11-21 DIAGNOSIS — IMO0002 Reserved for concepts with insufficient information to code with codable children: Secondary | ICD-10-CM

## 2019-11-21 DIAGNOSIS — I10 Essential (primary) hypertension: Secondary | ICD-10-CM

## 2019-11-21 NOTE — Telephone Encounter (Signed)
Jennifer [name] with [employer] request a call back @ [phone] with weight bearing & post op exercise  instruction for the shoulder.

## 2019-11-22 ENCOUNTER — Ambulatory Visit (INDEPENDENT_AMBULATORY_CARE_PROVIDER_SITE_OTHER): Payer: Medicare Other | Admitting: Otolaryngology

## 2019-11-22 ENCOUNTER — Telehealth: Payer: Self-pay | Admitting: Orthopedic Surgery

## 2019-11-22 ENCOUNTER — Encounter: Payer: Self-pay | Admitting: Internal Medicine

## 2019-11-22 NOTE — Telephone Encounter (Signed)
Debbie from [facility] called needing verification on weight bearing and exercise. The number to contact patient is [phone]

## 2019-11-22 NOTE — Progress Notes (Signed)
: Provider:  Hennie Duos MD Location:   [facility]   Place of Service:   SNF  PCP: Bartholomew Crews, MD Patient Care Team: Bartholomew Crews, MD as PCP - General (Internal Medicine) Thelma Comp, [place] (Optometry) [place], [place], [place] as Consulting Physician (Optometry) Newt Minion, MD as Attending Physician (Orthopedic Surgery) Lucas Mallow, MD as Consulting Physician (Urology)  Extended Emergency Contact Information Primary Emergency Contact: Pennie Banter of [place] Phone: [phone] Relation: Father Secondary Emergency Contact: Awilda [place] Mobile Phone: [phone] Relation: Son     Allergies: Iohexol, Ivp dye [iodinated diagnostic agents], Abilify [aripiprazole], and Morphine sulfate  Chief Complaint  Patient presents with  . New Admit To SNF    HPI: Patient is 58 y.o. female with congestive heart failure, asthma, chronic low back pain, chronic osteomyelitis of foot, COPD, depression, diabetic neuropathy, fibromyalgia, GERD, hyperlipidemia, hypertension, status post infective endocarditis, status post BKA, PVD, RA, diabetes mellitus type 2, and tobacco abuse who was [facility]25For right shoulder arthroscopy and debridement for right shoulder rotator cuff tear with impingement syndrome.There were no apparent complications.  Patient is admitted to skilled nursing facility for OT/PT.  While at skilled nursing facility patient will be followed forHypertension treated with atenolol benazepril Lasix hydrochlorothiazide, hyperlipidemia treated with Crestor and diabetes treated with Humalog insulin Metformin semaglutide and DeLudec insulin.  Past Medical History:  Diagnosis Date  . Anginal pain [facility])    '3' of 10 ischemia ruled out 9/9   . Arthritis of lumbar spine   . Asthma   . Cataract   . CHF (congestive heart failure) ([place])   . 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 Chronic bronchitis ([place])    "I get it alot" (09/28/2013)  . Chronic diastolic heart failure (HCC)    grade 2 per 2D echocardiogram (01/2013)  . Chronic lower back pain   . Chronic osteomyelitis of foot (HCC)    chronic, right secondary to diabetic foot ulcers  . Chronic pain syndrome 12/03/2011   Likely secondary to depression, "fibromyalgia", neuropathy, and obesity. Lumbar MRI 2014 no sig change from prior (2008) : Stable hypertrophic facet disease most notable at L4-5. Stable shallow left foraminal/extraforaminal disc protrusion at L4-5. No direct neural compression.      Marland Kitchen COPD 01/08/2007   PFT's 05/2007 : FEV1/FVC 82, FEV1 64% pred, FEF 25-75% 40% predicted, 16% improvement in FEV1 with bronchodilators.     . Depression   . Diabetes mellitus without complication ([place])    Type II  . Diabetic peripheral neuropathy ([place])   . DVT of upper extremity (deep vein thrombosis) ([place]) 03/11/2013   Secondary to PICC line. Right brachial vein, diagnosed on 03/10/2013 Coumadin for 3 months. End date 06/10/2013   . Dyspnea    "smoker"  . Environmental allergies    Hx: of  . Exertional shortness of breath   . Fatty liver 2003   observed on ultrasound abdomen  . Fibromyalgia   . GERD (gastroesophageal reflux disease)   . Glaucoma   . History of use of hearing aid   . Hyperlipidemia   . Hyperplastic colon polyp 12/2010   Per colonoscopy (12/2010) - Dr. Deatra Ina  . Hypertension   . 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 Blood cultures 01/26/13 S. Aureus and GBS. Blood cultures Feb 6th, 8th, and 9th and March  were negative.Repeat TEE 3/20 negative for vegitations  . 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 2/2 tobacco use, DM, and cont weight bearing on surgical wound and developed gangrene   . Pneumonia   . Polymicrobial bacterial infection 01/2013   GBS and S.  aureus bacteremia // Source likely infected diabetic foot ulcer  . PVD (peripheral vascular disease) with claudication ([place])    Stents to bilateral common iliac arteries (left 2005, right 2008), on chronic plavix  . Rheumatoid arthritis ([place])   . S/P BKA (below knee amputation) unilateral [facility])    Oct 2014 L - failed limb preserving treatment. 2/2 tobacco use, DM, and cont weight bearing on surgical wound and developed gangrene   . Tobacco abuse   . Type II diabetes mellitus with peripheral circulatory disorders, uncontrolled DX: 1993   Insulin dep. Poor control. Complicated by diabetic foot ulcer and diabetic eye disease.    Marland Kitchen Ulcer of foot, chronic (HCC)    Left. No OM per MRI (01/2013)    Past Surgical History:  Procedure Laterality Date  . ABDOMINAL HYSTERECTOMY  1997   secondary to uterine fibroids  . AMPUTATION Left 08/31/2013   Procedure: AMPUTATION RAY;  Surgeon: Newt Minion, MD;  Location: [place];  Service: Orthopedics;  Laterality: Left;  Left Foot 5th Ray Amputation  . AMPUTATION Left 09/28/2013   Procedure: Left Midfoot amputation;  Surgeon: Newt Minion, MD;  Location: [place];  Service: Orthopedics;  Laterality: Left;  Left Midfoot amputation  . AMPUTATION Left 10/14/2013   Procedure: AMPUTATION BELOW KNEE- left;  Surgeon: Newt Minion, MD;  Location: [place];  Service: Orthopedics;  Laterality: Left;  Left Below Knee Amputation   . AMPUTATION TOE Right 01/15/2017   Procedure: AMPUTATION 5th TOE RIGHT FOOT;  Surgeon: Edrick Kins, DPM;  Location: [place];  Service: Podiatry;  Laterality: Right;  . APPLICATION OF WOUND VAC  04/01/2019   Procedure: Application Of Wound Vac;  Surgeon: Newt Minion, MD;  Location: [place];  Service: Orthopedics;;  . BLADDER SURGERY     bladder reconstruction surgery  . BREAST BIOPSY     multiple-benign per pt  . COLONOSCOPY    . ESOPHAGOGASTRODUODENOSCOPY N/A 09/20/2013   Procedure: ESOPHAGOGASTRODUODENOSCOPY (EGD);  Surgeon: Jerene Bears, MD;   Location: [place];  Service: Gastroenterology;  Laterality: N/A;  . FOOT AMPUTATION THROUGH METATARSAL Left 09/28/2013  . GANGLION CYST EXCISION     multiple  . PERIPHERAL VASCULAR INTERVENTION     stents in lower ext  . SHOULDER ARTHROSCOPY Right 11/11/2019   RIGHT SHOULDER ARTHROSCOPY AND DEBRIDEMENT   . SHOULDER ARTHROSCOPY Right 11/11/2019   Procedure: RIGHT SHOULDER ARTHROSCOPY AND DEBRIDEMENT;  Surgeon: Newt Minion, MD;  Location: [place];  Service: Orthopedics;  Laterality: Right;  . SHOULDER ARTHROSCOPY W/ ROTATOR CUFF REPAIR Bilateral   . SKIN SPLIT GRAFT Bilateral 05/13/2013   Procedure: Right and Left Foot Allograft Skin Graft;  Surgeon: Newt Minion, MD;  Location: [place];  Service: Orthopedics;  Laterality: Bilateral;  Right and Left Foot Allograft Skin Graft  . STUMP REVISION Left 04/01/2019   Procedure: REVISION LEFT BELOW KNEE AMPUTATION;  Surgeon: Newt Minion, MD;  Location: [place];  Service: Orthopedics;  Laterality: Left;  . TEE WITHOUT CARDIOVERSION N/A 01/31/2013   Procedure: TRANSESOPHAGEAL ECHOCARDIOGRAM (TEE);  Surgeon: Fay Records, MD;  Location: [place];  Service: Cardiovascular;  Laterality:  N/A;  Rm [id number]  . TEE WITHOUT CARDIOVERSION N/A 03/10/2013   Procedure: TRANSESOPHAGEAL ECHOCARDIOGRAM (TEE);  Surgeon: Larey Dresser, MD;  Location: [place];  Service: Cardiovascular;  Laterality: N/A;  Rm. [id number]  . TOE AMPUTATION Left 08/31/2013   4TH & 5 TH TOE   . TONSILLECTOMY    . TUBAL LIGATION    . WRIST SURGERY Right    "for tumors" (09/28/2013)    Allergies as of 11/21/2019      Reactions   Iohexol Other (See Comments)    Desc: IV CONTRAST CAUSE NEPHROPATHY IN 2007   Ivp Dye [iodinated Diagnostic Agents] Other (See Comments)   Desc: IV CONTRAST CAUSE NEPHROPATHY IN 2007   Abilify [aripiprazole] Other (See Comments)   Urinary freq Nov 2016   Morphine Sulfate Itching, Rash      Medication List       Accurate as of November 21, 2019 11:59 PM.  If you have any questions, ask your nurse or doctor.        albuterol (2.5 MG/3ML) 0.083% nebulizer solution Commonly known as: PROVENTIL INHALE THE CONTENTS OF 1 VIAL VIA NEBULIZER EVERY 6 HOURS AS NEEDED FOR WHEEZING What changed: See the new instructions.   ProAir HFA 108 (90 Base) MCG/ACT inhaler Generic drug: albuterol INHALE 2 PUFFS BY MOUTH EVERY 6 HOURS AS NEEDED FOR WHEEZING What changed: See the new instructions.   amLODipine 10 MG tablet Commonly known as: NORVASC Take 1 tablet (10 mg total) by mouth daily.   Anoro Ellipta 62.5-25 MCG/INH Aepb Generic drug: umeclidinium-vilanterol TAKE 1 PUFF BY MOUTH EVERY DAY What changed: See the new instructions.   aspirin EC 81 MG tablet Take 81 mg by mouth daily.   atenolol 100 MG tablet Commonly known as: TENORMIN TAKE 1 TABLET BY MOUTH EVERY DAY   baclofen 10 MG tablet Commonly known as: LIORESAL TAKE 1 TABLET BY MOUTH THREE TIMES A DAY AS NEEDED FOR MUSCLE SPASMS What changed: See the new instructions.   benazepril 40 MG tablet Commonly known as: LOTENSIN Take 1 tablet (40 mg total) by mouth daily.   benzonatate 100 MG capsule Commonly known as: Tessalon Perles Take 1 capsule (100 mg total) by mouth at bedtime as needed for cough.   betamethasone dipropionate 0.05 % cream Apply 1 application topically 2 (two) times daily as needed (irritation).   Biotin 5 MG Tabs Take 5 mg by mouth daily.   buPROPion 300 MG 24 hr tablet Commonly known as: WELLBUTRIN XL TAKE 1 TABLET BY MOUTH DAILY   cholecalciferol 25 MCG (1000 UT) tablet Commonly known as: VITAMIN D3 Take 1,000 Units by mouth daily.   diphenhydrAMINE 25 mg capsule Commonly known as: BENADRYL Take 1 capsule (25 mg total) by mouth every 6 (six) hours as needed for itching, allergies or sleep.   Fish Oil 1000 MG Caps Take 1,000 mg by mouth daily.   fluticasone 50 MCG/ACT nasal spray Commonly known as: FLONASE INSTILL 1 SPRAY IN EACH NOSTRIL  DAILY What changed:   how much to take  how to take this  when to take this  reasons to take this  additional instructions   furosemide 40 MG tablet Commonly known as: LASIX TAKE ONE TABLET BY MOUTH DAILY AS NEEDED What changed: reasons to take this   hydrochlorothiazide 25 MG tablet Commonly known as: HYDRODIURIL TAKE 1 TABLET BY MOUTH ONCE DAILY   hydrOXYzine 25 MG capsule Commonly known as: VISTARIL Take 2 capsules (50 mg total) by mouth daily.  insulin lispro 100 UNIT/ML KwikPen Commonly known as: HumaLOG KwikPen If blood sugar is < 175 Do not take any correction insulin. 176-225 inject 2 units. 226-275 inject 4 units. [id number] inject 6 units. 326-375 inject 8 units. 376-425 inject 10 units. 426-475 inject 12 units. [id number] inject 14 units and call office for further instructions. What changed:   how much to take  how to take this  when to take this  additional instructions   Insulin Pen Needle 32G X 4 MM Misc Use to inject insulin 4 times a day and semaglutide once weekly   Insulin Syringe-Needle U-100 31G X 15/64" 0.3 ML Misc Use with Humalog to correct blood sugar before meals three times a day   ipratropium 0.02 % nebulizer solution Commonly known as: ATROVENT USE 1 VIAL VIA NEBULIZER EVERY 6 HOURS AS NEEDED FOR WHEEZING   metFORMIN 500 MG 24 hr tablet Commonly known as: GLUCOPHAGE-XR TAKE 1 TABLET BY MOUTH EVERY DAY WITH BREAKFAST What changed: See the new instructions.   multivitamin with minerals Tabs tablet Take 1 tablet by mouth daily.   Myrbetriq 50 MG Tb24 tablet Generic drug: mirabegron ER TAKE 1 TABLET BY MOUTH DAILY   nystatin powder Commonly known as: nystatin APPLY TO AFFECTED AREA THREE TIMES A DAY What changed:   how much to take  how to take this  when to take this   omeprazole 20 MG capsule Commonly known as: PRILOSEC TAKE 1 CAPSULE (20 MG TOTAL) BY MOUTH 2 (TWO) TIMES DAILY BEFORE A MEAL.   OneTouch Verio test  strip Generic drug: glucose blood USE TO TEST BLOOD SUGAR 3-4 TIMES DAILY   oxyCODONE-acetaminophen 5-325 MG tablet Commonly known as: Percocet Take 1 tablet by mouth every 4 (four) hours as needed for severe pain.   oxyCODONE-acetaminophen 5-325 MG tablet Commonly known as: Percocet Take 1 tablet by mouth every 4 (four) hours as needed for severe pain.   Ozempic (0.25 or 0.5 MG/DOSE) 2 MG/1.5ML Sopn Generic drug: Semaglutide(0.25 or 0.5MG /DOS) INJECT 0.5 MG INTO THE SKIN ONCE A WEEK.   pregabalin 200 MG capsule Commonly known as: LYRICA TAKE 1 CAPSULE BY MOUTH THREE TIMES A DAY What changed: See the new instructions.   rosuvastatin 20 MG tablet Commonly known as: CRESTOR TAKE 1 TABLET BY MOUTH AT BEDTIME What changed: when to take this   traZODone 100 MG tablet Commonly known as: DESYREL Take 100 mg by mouth at bedtime as needed for sleep.   [name] FlexTouch 200 UNIT/ML Sopn Generic drug: Insulin Degludec Inject 70 Units into the skin daily.   vitamin B-12 500 MCG tablet Commonly known as: CYANOCOBALAMIN Take 500 mcg by mouth daily.   vitamin C 500 MG tablet Commonly known as: ASCORBIC ACID Take 500 mg by mouth daily.       No orders of the defined types were placed in this encounter.   Immunization History  Administered Date(s) Administered  . Influenza Split 10/16/2011, 09/23/2012  . Influenza Whole 08/20/2010  . Influenza,inj,Quad PF,6+ Mos 09/01/2013, 10/05/2014, 09/06/2015, 09/25/2016, 09/24/2017, 10/21/2018, 10/19/2019  . Pneumococcal Polysaccharide-23 10/16/2011  . Td 07/15/2010    Social History   Tobacco Use  . Smoking status: Current Every Day Smoker    Packs/day: 1.00    Years: 50.00    Pack years: 50.00    Types: Cigarettes  . Smokeless tobacco: Never Used  . Tobacco comment: 1 PPD  Substance Use Topics  . Alcohol use: No    Alcohol/week: 0.0 standard drinks  Family history is   Family History  Problem Relation Age of Onset   . Diverticulosis Mother   . Diabetes Mother   . Hypertension Mother   . Congestive Heart Failure Mother   . Asthma Father   . CAD Sister 33       MI at age 40 per patient.  However, she has not had a stent or CABG.   . Heart disease Sister        before age 63  . Breast cancer Neg Hx       Review of Systems  GENERAL:  no fevers, fatigue, appetite changes SKIN: No itching, or rash EYES: No eye pain, redness, discharge EARS: No earache, tinnitus, change in hearing NOSE: No congestion, drainage or bleeding  MOUTH/THROAT: No mouth or tooth pain, No sore throat RESPIRATORY: No cough, wheezing, SOB CARDIAC: No chest pain, palpitations, lower extremity edema  GI: No abdominal pain, No N/V/D or constipation, No heartburn or reflux  GU: No dysuria, frequency or urgency, or incontinence  MUSCULOSKELETAL: No unrelieved bone/joint pain NEUROLOGIC: No headache, dizziness or focal weakness PSYCHIATRIC: No c/o anxiety or sadness   Vitals:   11/22/19 1549  BP: (!) 107/58  Pulse: 75  Resp: 18  Temp: 98 F (36.7 C)    SpO2 Readings from Last 1 Encounters:  11/16/19 98%   Body mass index is 39.75 kg/m.     Physical Exam  GENERAL APPEARANCE: Alert, conversant,  No acute distress.  SKIN: No diaphoresis rash HEAD: Normocephalic, atraumatic  EYES: Conjunctiva/lids clear. Pupils round, reactive. EOMs intact.  EARS: External exam WNL, canals clear. Hearing grossly normal.  NOSE: No deformity or discharge.  MOUTH/THROAT: Lips w/o lesions  RESPIRATORY: Breathing is even, unlabored. Lung sounds are clear   CARDIOVASCULAR: Heart RRR no murmurs, rubs or gallops. No peripheral edema.   GASTROINTESTINAL: Abdomen is soft, non-tender, not distended w/ normal bowel sounds. GENITOURINARY: Bladder non tender, not distended  MUSCULOSKELETAL: No abnormal joints or musculature NEUROLOGIC:  Cranial nerves 2-12 grossly intact. Moves all extremities  PSYCHIATRIC: Mood and affect appropriate to  situation, no behavioral issues  Patient Active Problem List   Diagnosis Date Noted  . Pain in joint of right shoulder 11/11/2019  . Nontraumatic incomplete tear of right rotator cuff   . CKD (chronic kidney disease) stage 3, GFR 30-59 ml/min 10/18/2019  . 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 Skin rash 06/30/2019  . Impingement syndrome of right shoulder 06/22/2019  . Urinary incontinence 05/13/2018  . OSA (obstructive sleep apnea) 06/12/2017  . Morbid obesity with BMI of 40.0-44.9, adult ([place]) 03/26/2017  . Toe amputation status, right 01/16/2017  . Thrombocytopenia ([place]) 06/05/2016  . 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 Type 2 diabetes with nephropathy ([place]) 09/07/2015  . Uncontrolled diabetes mellitus with retinopathy, due to underlying condition, without macular edema ([place]) 09/05/2015  . Diabetic retinopathy ([place]) 09/05/2015  . Counseling regarding end of life decision making 06/14/2015  . Atherosclerosis of aorta ([place]) 04/04/2015  . Anemia 10/05/2014  . Chronic diastolic heart failure ([place])   . Hx of BKA, left ([place])   . Tobacco abuse   . Severe obesity (BMI >= 40) ([place]) 03/02/2013  . Abnormality of gait 03/01/2013  . Healthcare maintenance 07/10/2012  . Chronic prescription opiate use 12/03/2011  . Secondary diabetes mellitus with peripheral vascular disease ([place]) 08/27/2011  . 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 ([place]) 11/29/2009  . Hypertension associated with diabetes ([place]) 11/29/2009  . Chronic insomnia 10/25/2009  . GASTROESOPHAGEAL REFLUX DISEASE  11/24/2008  . Depression, major, severe recurrence ([place]) 04/06/2008  . DM (diabetes mellitus) type II uncontrolled, periph vascular disorder ([place]) 04/02/2007  . Hyperlipidemia associated with type 2 diabetes mellitus ([place]) 01/08/2007  . COPD exacerbation ([place]) 01/08/2007      Labs reviewed: Basic Metabolic Panel:    Component Value Date/Time   NA 139 11/11/2019 0803   NA 144 08/24/2019 1524   K 4.1 11/11/2019 0803   CL 103  11/11/2019 0803   CO2 24 11/11/2019 0803   GLUCOSE 175 (H) 11/11/2019 0803   BUN 12 11/11/2019 0803   BUN 14 08/24/2019 1524   CREATININE 0.88 11/11/2019 0803   CREATININE 0.68 01/31/2015 1641   CALCIUM 9.7 11/11/2019 0803   PROT 7.8 10/17/2019 0015   PROT 6.5 08/24/2019 1524   ALBUMIN 2.8 (L) 10/17/2019 0015   ALBUMIN 3.8 08/24/2019 1524   AST 22 10/17/2019 0015   ALT 18 10/17/2019 0015   ALKPHOS 162 (H) 10/17/2019 0015   BILITOT 0.6 10/17/2019 0015   BILITOT <0.2 08/24/2019 1524   GFRNONAA >60 11/11/2019 0803   GFRNONAA >89 01/31/2015 1641   GFRAA >60 11/11/2019 0803   GFRAA >89 01/31/2015 1641    Recent Labs    04/05/19 2100  10/18/19 0525 10/19/19 0418 11/11/19 0803  NA 138   < > 142 140 139  K 4.8   < > 3.8 3.8 4.1  CL 102   < > 105 104 103  CO2 26   < > 24 25 24   GLUCOSE 170*   < > 153* 136* 175*  BUN 22*   < > 16 16 12   CREATININE 1.19*   < > 1.24* 1.01* 0.88  CALCIUM 8.8*   < > 8.8* 9.0 9.7  PHOS 3.5  --   --   --   --    < > = values in this interval not displayed.   Liver Function Tests: Recent Labs    05/14/19 0521 08/24/19 1524 10/17/19 0015  AST 26 16 22   ALT 23 12 18   ALKPHOS 72 77 162*  BILITOT 0.5 <0.2 0.6  PROT 6.8 6.5 7.8  ALBUMIN 3.1* 3.8 2.8*   Recent Labs    05/11/19 1330  LIPASE 22   No results for input(s): AMMONIA in the last 8760 hours. CBC: Recent Labs    05/11/19 1330  10/17/19 0015  10/18/19 0525 10/19/19 0418 11/11/19 0803  WBC 12.6*   < > 20.1*   < > 18.9* 15.3* 9.0  NEUTROABS 6.3  --  15.8*  --   --   --   --   HGB 12.2   < > 12.6   < > 12.4 11.8* 14.1  HCT 37.8   < > 39.4   < > 38.8 36.8 45.5  MCV 96.4   < > 94.0   < > 95.1 92.2 97.6  PLT 157   < > 189   < > 188 216 148*   < > = values in this interval not displayed.   Lipid No results for input(s): CHOL, HDL, LDLCALC, TRIG in the last 8760 hours.  Cardiac Enzymes: Recent Labs    03/25/19 1015 04/05/19 2100  CKTOTAL  --  83  TROPONINI <0.03  --     BNP: Recent Labs    05/11/19 1330 10/17/19 0015  BNP 54.4 296.1*   Lab Results  Component Value Date   MICROALBUR 56.5 (H) 10/05/2014   Lab Results  Component Value Date  HGBA1C 9.3 (H) 11/11/2019   Lab Results  Component Value Date   TSH 0.135 (L) 04/05/2019   Lab Results  Component Value Date   VITAMINB12 281 01/07/2017   Lab Results  Component Value Date   FOLATE 12.5 10/05/2014   Lab Results  Component Value Date   IRON 68 10/05/2014   TIBC 315 10/05/2014   FERRITIN 79 10/05/2014    Imaging and Procedures obtained prior to SNF admission: No results found.   Not all labs, radiology exams or other studies done during hospitalization come through on my EPIC note; however they are reviewed by me.    Assessment and Plan  Rotator cuff tear right/right shoulder arthroscopy and debridement-no apparent complications SNF-admitted for OT/PT; problem will be pain management as patient is a chronic pain patient therefore she will require a lot of pain medication  Hypertension SNF-controlled; continue Norvasc 10 mg daily, atenolol 100 mg daily, benazepril 40 mg daily, hydrochlorothiazide 25 mg daily  Diabetes type 2 SNF-not well controlled, A1c 9.32 weeks ago; unlikely to make a big difference in her 2-week stay; continue Sliding scale Metformin 500 mg 24-hour tablet 1 p.o. daily, semaglutide 0.5 mg once weekly and degludec insulin 70 units in the skin daily   Hyperlipidemia SNF-uncontrolled; continue 1 g omega-3 daily and Crestor 20 mg daily   Colostogram 45 minutes;> 50% of time with patient was spent reviewing records, labs, tests and studies, counseling and developing plan of care  Hennie Duos, MD

## 2019-11-23 ENCOUNTER — Telehealth: Payer: Self-pay | Admitting: Orthopedic Surgery

## 2019-11-23 ENCOUNTER — Telehealth: Payer: Self-pay | Admitting: *Deleted

## 2019-11-23 NOTE — Telephone Encounter (Signed)
Jacob Moores, Orvis Brill, RN        Hi Lauren,   I am following up on patient Jennifer [name] for her power wheelchair. She needs a F2F scheduled for the power wheelchair. Can you please let me know if she has any upcoming appointments scheduled or can you assist in getting it scheduled please?

## 2019-11-23 NOTE — Telephone Encounter (Signed)
Completed.

## 2019-11-23 NOTE — Telephone Encounter (Signed)
Patient scheduled for telehealth [facility] visit tomorrow to discuss need for power w/c. L. Naftuli Dalsanto, BSN, RN-BC

## 2019-11-23 NOTE — Telephone Encounter (Signed)
Received call from Davis (PT) with [facility] needing clarification on weight bearing status. The number to contact Rosendo Gros is [phone]

## 2019-11-23 NOTE — Telephone Encounter (Signed)
Done, lvm on Camille ext.

## 2019-11-23 NOTE — Telephone Encounter (Signed)
Jennifer [name] was called and lvm pertaining to patient's weightbearing status. Patient is to weight bear as tolerated and do ROM exercises as tolerated as well. Thank you

## 2019-11-24 ENCOUNTER — Ambulatory Visit (INDEPENDENT_AMBULATORY_CARE_PROVIDER_SITE_OTHER): Payer: Medicare Other | Admitting: Internal Medicine

## 2019-11-24 ENCOUNTER — Other Ambulatory Visit: Payer: Self-pay

## 2019-11-24 DIAGNOSIS — Z7689 Persons encountering health services in other specified circumstances: Secondary | ICD-10-CM | POA: Diagnosis not present

## 2019-11-24 DIAGNOSIS — Z89512 Acquired absence of left leg below knee: Secondary | ICD-10-CM | POA: Diagnosis not present

## 2019-11-24 DIAGNOSIS — Z9889 Other specified postprocedural states: Secondary | ICD-10-CM

## 2019-11-24 DIAGNOSIS — Z993 Dependence on wheelchair: Secondary | ICD-10-CM | POA: Diagnosis not present

## 2019-11-24 DIAGNOSIS — G8190 Hemiplegia, unspecified affecting unspecified side: Secondary | ICD-10-CM

## 2019-11-24 DIAGNOSIS — Z7409 Other reduced mobility: Secondary | ICD-10-CM | POA: Diagnosis not present

## 2019-11-24 DIAGNOSIS — J449 Chronic obstructive pulmonary disease, unspecified: Secondary | ICD-10-CM

## 2019-11-24 DIAGNOSIS — E1151 Type 2 diabetes mellitus with diabetic peripheral angiopathy without gangrene: Secondary | ICD-10-CM

## 2019-11-24 NOTE — Progress Notes (Addendum)
[facility] Telephone Encounter Continuity Care Appointment  HPI:   This telephone encounter was created for Jennifer [name] on 11/24/2019 for the following purpose/cc mobility assessment.  This is a 58 year old female with a history listed below including diastolic heart failure, diabetes mellitus, PVD status post left BKA, COPD, and recent right shoulder rotator cuff repair.  Being contacted today to assess mobility and function.  This encounter is for mobility and power wheelchair assessment. Patient is wheelchair bound in the setting of LLE BKA and right rotator cuff repair. This severely limits her ability to participate in mobility related activities of daily living. Patient requires assistance for dressing, toileting, bathing, grooming & hygiene. She is completely dependent on family for her IADLS and meal prepping. A powered wheelchair would provider her with more independence with dressing, toileting, bathing and hygiene by allowing her more freedom to move around the house on her own. A cane or walker would not provide her with sufficient or safe support. With her LLE BKA and recent right rotator cuff repair she is unable to self-propel a manual wheelchair. A scooter would not be appropriate because she does not have adequate upper or lower extremity strength. Patient is physically and mentally capable of operating a power wheelchair safely in the home and is willing to do so. I have reviewed PT's documentation and agree with their assessment and recommendations.   Patient's height is 5'7. Last recorded weight was 253 pounds.   Past Medical History:  Past Medical History:  Diagnosis Date  . Anginal pain [facility])    '3' of 10 ischemia ruled out 9/9   . Arthritis of lumbar spine   . Asthma   . Cataract   . CHF (congestive heart failure) ([place])   . 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 Chronic bronchitis ([place])    "I get it alot" (09/28/2013)    . Chronic diastolic heart failure (HCC)    grade 2 per 2D echocardiogram (01/2013)  . Chronic lower back pain   . Chronic osteomyelitis of foot (HCC)    chronic, right secondary to diabetic foot ulcers  . Chronic pain syndrome 12/03/2011   Likely secondary to depression, "fibromyalgia", neuropathy, and obesity. Lumbar MRI 2014 no sig change from prior (2008) : Stable hypertrophic facet disease most notable at L4-5. Stable shallow left foraminal/extraforaminal disc protrusion at L4-5. No direct neural compression.      Marland Kitchen COPD 01/08/2007   PFT's 05/2007 : FEV1/FVC 82, FEV1 64% pred, FEF 25-75% 40% predicted, 16% improvement in FEV1 with bronchodilators.     . Depression   . Diabetes mellitus without complication ([place])    Type II  . Diabetic peripheral neuropathy ([place])   . DVT of upper extremity (deep vein thrombosis) ([place]) 03/11/2013   Secondary to PICC line. Right brachial vein, diagnosed on 03/10/2013 Coumadin for 3 months. End date 06/10/2013   . Dyspnea    "smoker"  . Environmental allergies    Hx: of  . Exertional shortness of breath   . Fatty liver 2003   observed on ultrasound abdomen  . Fibromyalgia   . GERD (gastroesophageal reflux disease)   . Glaucoma   . History of use of hearing aid   . Hyperlipidemia   . Hyperplastic colon polyp 12/2010   Per colonoscopy (12/2010) - Dr. Deatra Ina  . Hypertension   . 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 Blood cultures 01/26/13  S. Aureus and GBS. Blood cultures Feb 6th, 8th, and 9th and March were negative.Repeat TEE 3/20 negative for vegitations  . 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 2/2 tobacco use, DM, and cont weight bearing on surgical wound and developed gangrene   . Pneumonia   . Polymicrobial bacterial infection 01/2013   GBS and S. aureus bacteremia // Source likely infected diabetic foot ulcer  . PVD  (peripheral vascular disease) with claudication ([place])    Stents to bilateral common iliac arteries (left 2005, right 2008), on chronic plavix  . Rheumatoid arthritis ([place])   . S/P BKA (below knee amputation) unilateral [facility])    Oct 2014 L - failed limb preserving treatment. 2/2 tobacco use, DM, and cont weight bearing on surgical wound and developed gangrene   . Tobacco abuse   . Type II diabetes mellitus with peripheral circulatory disorders, uncontrolled DX: 1993   Insulin dep. Poor control. Complicated by diabetic foot ulcer and diabetic eye disease.    Marland Kitchen Ulcer of foot, chronic (HCC)    Left. No OM per MRI (01/2013)      ROS:   Reports right upper extremity pain and weakness, denies any fevers, chills, nausea, vomiting, headaches, light headedness, or dizziness.   Assessment / Plan / Recommendations:   Please see A&P under problem oriented charting for assessment of the patient's acute and chronic medical conditions.   As always, pt is advised that if symptoms worsen or new symptoms arise, they should go to an urgent care facility or to to ER for further evaluation.   Consent and Medical Decision Making:   Patient discussed with Dr. Philipp Ovens.   This is a telephone encounter between [name] and [name] on 11/24/2019 for mobility assessment. The visit was conducted with the patient located at home and [name] at [facility]. The patient's identity was confirmed using their DOB and current address. The patient has consented to being evaluated through a telephone encounter and understands the associated risks (an examination cannot be done and the patient may need to come in for an appointment) / benefits (allows the patient to remain at home, decreasing exposure to coronavirus). I personally spent 11 minutes on medical discussion.

## 2019-11-25 ENCOUNTER — Other Ambulatory Visit: Payer: Self-pay

## 2019-11-25 ENCOUNTER — Other Ambulatory Visit: Payer: Self-pay | Admitting: Internal Medicine

## 2019-11-25 ENCOUNTER — Ambulatory Visit (INDEPENDENT_AMBULATORY_CARE_PROVIDER_SITE_OTHER): Payer: Medicare Other | Admitting: Family

## 2019-11-25 ENCOUNTER — Encounter: Payer: Self-pay | Admitting: Internal Medicine

## 2019-11-25 ENCOUNTER — Encounter: Payer: Self-pay | Admitting: Family

## 2019-11-25 VITALS — Ht 67.0 in | Wt 253.0 lb

## 2019-11-25 DIAGNOSIS — M75121 Complete rotator cuff tear or rupture of right shoulder, not specified as traumatic: Secondary | ICD-10-CM

## 2019-11-25 MED ORDER — OXYCODONE-ACETAMINOPHEN 5-325 MG PO TABS: 1.0000 | ORAL_TABLET | Freq: Four times a day (QID) | ORAL | 0 refills | Status: DC | PRN

## 2019-11-25 NOTE — Assessment & Plan Note (Signed)
Patient is here for mobility and power wheelchair assessment. Patient is wheelchair bound in the setting of LLE BKA and right rotator cuff repair. This severely limits her ability to participate in mobility related activities of daily living. Patient requires assistance for dressing, toileting, bathing, grooming & hygiene. She is completely dependent on family for her IADLS and meal prepping. A powered wheelchair would provider her with more independence with dressing, toileting, bathing and hygiene by allowing her more freedom to move around the house on her own. A cane or walker would not provider her with sufficient or safe support. With her hemiparesis she is unable to self-propel a manual wheelchair. A scooter would not be appropriate because she does not have adequate upper or lower extremity strength. Patient is physically and mentally capable of operating a power wheelchair safely in the home and is willing to do so. I have reviewed PT's documentation and agree with their assessment and recommendations.  Patient's height is 5'7.  Last recorded weight was 253 pounds.

## 2019-11-25 NOTE — Progress Notes (Signed)
Location:   [facility] Service:   SNF  Bartholomew Crews, MD  Patient Care Team: Bartholomew Crews, MD as PCP - General (Internal Medicine) Thelma Comp, Clyde (Optometry) [place], Heather, [place] as Consulting Physician (Optometry) Newt Minion, MD as Attending Physician (Orthopedic Surgery) Lucas Mallow, MD as Consulting Physician (Urology)  Extended Emergency Contact Information Primary Emergency Contact: Pennie Banter of [place] Phone: [phone] Relation: Father Secondary Emergency Contact: Awilda [place] of [place] Mobile Phone: [phone] Relation: Son    Allergies: Iohexol, Ivp dye [iodinated diagnostic agents], Abilify [aripiprazole], and Morphine sulfate  No chief complaint on file.   HPI: Patient is 58 y.o. female who   Past Medical History:  Diagnosis Date  . Anginal pain [facility])    '3' of 10 ischemia ruled out 9/9   . Arthritis of lumbar spine   . Asthma   . Cataract   . CHF (congestive heart failure) ([place])   . 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 Chronic bronchitis ([place])    "I get it alot" (09/28/2013)  . Chronic diastolic heart failure (HCC)    grade 2 per 2D echocardiogram (01/2013)  . Chronic lower back pain   . Chronic osteomyelitis of foot (HCC)    chronic, right secondary to diabetic foot ulcers  . Chronic pain syndrome 12/03/2011   Likely secondary to depression, "fibromyalgia", neuropathy, and obesity. Lumbar MRI 2014 no sig change from prior (2008) : Stable hypertrophic facet disease most notable at L4-5. Stable shallow left foraminal/extraforaminal disc protrusion at L4-5. No direct neural compression.      Marland Kitchen COPD 01/08/2007   PFT's 05/2007 : FEV1/FVC 82, FEV1 64% pred, FEF 25-75% 40% predicted, 16% improvement in FEV1 with bronchodilators.     . Depression   . Diabetes mellitus without complication ([place])    Type II  . Diabetic peripheral neuropathy ([place])   . DVT of  upper extremity (deep vein thrombosis) ([place]) 03/11/2013   Secondary to PICC line. Right brachial vein, diagnosed on 03/10/2013 Coumadin for 3 months. End date 06/10/2013   . Dyspnea    "smoker"  . Environmental allergies    Hx: of  . Exertional shortness of breath   . Fatty liver 2003   observed on ultrasound abdomen  . Fibromyalgia   . GERD (gastroesophageal reflux disease)   . Glaucoma   . History of use of hearing aid   . Hyperlipidemia   . Hyperplastic colon polyp 12/2010   Per colonoscopy (12/2010) - Dr. Deatra Ina  . Hypertension   . 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 Blood cultures 01/26/13 S. Aureus and GBS. Blood cultures Feb 6th, 8th, and 9th and March were negative.Repeat TEE 3/20 negative for vegitations  . 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 2/2 tobacco use, DM, and cont weight bearing on surgical wound and developed gangrene   . Pneumonia   . Polymicrobial bacterial infection 01/2013   GBS and S. aureus bacteremia // Source likely infected diabetic foot ulcer  . PVD (peripheral vascular disease) with claudication ([place])    Stents to bilateral common iliac arteries (left 2005, right 2008), on chronic plavix  . Rheumatoid arthritis ([place])   . S/P BKA (below knee amputation) unilateral [facility])    Oct 2014 L - failed limb preserving treatment. 2/2 tobacco  use, DM, and cont weight bearing on surgical wound and developed gangrene   . Tobacco abuse   . Type II diabetes mellitus with peripheral circulatory disorders, uncontrolled DX: 1993   Insulin dep. Poor control. Complicated by diabetic foot ulcer and diabetic eye disease.    Marland Kitchen Ulcer of foot, chronic (HCC)    Left. No OM per MRI (01/2013)    Past Surgical History:  Procedure Laterality Date  . ABDOMINAL HYSTERECTOMY  1997   secondary to uterine fibroids  . AMPUTATION Left 08/31/2013   Procedure:  AMPUTATION RAY;  Surgeon: Newt Minion, MD;  Location: [place];  Service: Orthopedics;  Laterality: Left;  Left Foot 5th Ray Amputation  . AMPUTATION Left 09/28/2013   Procedure: Left Midfoot amputation;  Surgeon: Newt Minion, MD;  Location: [place];  Service: Orthopedics;  Laterality: Left;  Left Midfoot amputation  . AMPUTATION Left 10/14/2013   Procedure: AMPUTATION BELOW KNEE- left;  Surgeon: Newt Minion, MD;  Location: [place];  Service: Orthopedics;  Laterality: Left;  Left Below Knee Amputation   . AMPUTATION TOE Right 01/15/2017   Procedure: AMPUTATION 5th TOE RIGHT FOOT;  Surgeon: Edrick Kins, DPM;  Location: [place];  Service: Podiatry;  Laterality: Right;  . APPLICATION OF WOUND VAC  04/01/2019   Procedure: Application Of Wound Vac;  Surgeon: Newt Minion, MD;  Location: [place];  Service: Orthopedics;;  . BLADDER SURGERY     bladder reconstruction surgery  . BREAST BIOPSY     multiple-benign per pt  . COLONOSCOPY    . ESOPHAGOGASTRODUODENOSCOPY N/A 09/20/2013   Procedure: ESOPHAGOGASTRODUODENOSCOPY (EGD);  Surgeon: Jerene Bears, MD;  Location: [place];  Service: Gastroenterology;  Laterality: N/A;  . FOOT AMPUTATION THROUGH METATARSAL Left 09/28/2013  . GANGLION CYST EXCISION     multiple  . PERIPHERAL VASCULAR INTERVENTION     stents in lower ext  . SHOULDER ARTHROSCOPY Right 11/11/2019   RIGHT SHOULDER ARTHROSCOPY AND DEBRIDEMENT   . SHOULDER ARTHROSCOPY Right 11/11/2019   Procedure: RIGHT SHOULDER ARTHROSCOPY AND DEBRIDEMENT;  Surgeon: Newt Minion, MD;  Location: [place];  Service: Orthopedics;  Laterality: Right;  . SHOULDER ARTHROSCOPY W/ ROTATOR CUFF REPAIR Bilateral   . SKIN SPLIT GRAFT Bilateral 05/13/2013   Procedure: Right and Left Foot Allograft Skin Graft;  Surgeon: Newt Minion, MD;  Location: [place];  Service: Orthopedics;  Laterality: Bilateral;  Right and Left Foot Allograft Skin Graft  . STUMP REVISION Left 04/01/2019   Procedure: REVISION LEFT BELOW KNEE  AMPUTATION;  Surgeon: Newt Minion, MD;  Location: [place];  Service: Orthopedics;  Laterality: Left;  . TEE WITHOUT CARDIOVERSION N/A 01/31/2013   Procedure: TRANSESOPHAGEAL ECHOCARDIOGRAM (TEE);  Surgeon: Fay Records, MD;  Location: [place];  Service: Cardiovascular;  Laterality: N/A;  Rm [phone]  . TEE WITHOUT CARDIOVERSION N/A 03/10/2013   Procedure: TRANSESOPHAGEAL ECHOCARDIOGRAM (TEE);  Surgeon: Larey Dresser, MD;  Location: [place];  Service: Cardiovascular;  Laterality: N/A;  Rm. [phone]  . TOE AMPUTATION Left 08/31/2013   4TH & 5 TH TOE   . TONSILLECTOMY    . TUBAL LIGATION    . WRIST SURGERY Right    "for tumors" (09/28/2013)    Allergies as of 11/25/2019      Reactions   Iohexol Other (See Comments)    Desc: IV CONTRAST CAUSE NEPHROPATHY IN 2007   Ivp Dye [iodinated Diagnostic Agents] Other (See Comments)   Desc: IV CONTRAST CAUSE NEPHROPATHY IN 2007  Abilify [aripiprazole] Other (See Comments)   Urinary freq Nov 2016   Morphine Sulfate Itching, Rash      Medication List       Accurate as of November 25, 2019  1:11 PM. If you have any questions, ask your nurse or doctor.        albuterol (2.5 MG/3ML) 0.083% nebulizer solution Commonly known as: PROVENTIL INHALE THE CONTENTS OF 1 VIAL VIA NEBULIZER EVERY 6 HOURS AS NEEDED FOR WHEEZING What changed: See the new instructions.   ProAir HFA 108 (90 Base) MCG/ACT inhaler Generic drug: albuterol INHALE 2 PUFFS BY MOUTH EVERY 6 HOURS AS NEEDED FOR WHEEZING What changed: See the new instructions.   amLODipine 10 MG tablet Commonly known as: NORVASC Take 1 tablet (10 mg total) by mouth daily.   Anoro Ellipta 62.5-25 MCG/INH Aepb Generic drug: umeclidinium-vilanterol TAKE 1 PUFF BY MOUTH EVERY DAY What changed: See the new instructions.   aspirin EC 81 MG tablet Take 81 mg by mouth daily.   atenolol 100 MG tablet Commonly known as: TENORMIN TAKE 1 TABLET BY MOUTH EVERY DAY   baclofen 10 MG tablet Commonly  known as: LIORESAL TAKE 1 TABLET BY MOUTH THREE TIMES A DAY AS NEEDED FOR MUSCLE SPASMS What changed: See the new instructions.   benazepril 40 MG tablet Commonly known as: LOTENSIN Take 1 tablet (40 mg total) by mouth daily.   benzonatate 100 MG capsule Commonly known as: Tessalon Perles Take 1 capsule (100 mg total) by mouth at bedtime as needed for cough.   betamethasone dipropionate 0.05 % cream Apply 1 application topically 2 (two) times daily as needed (irritation).   Biotin 5 MG Tabs Take 5 mg by mouth daily.   buPROPion 300 MG 24 hr tablet Commonly known as: WELLBUTRIN XL TAKE 1 TABLET BY MOUTH DAILY   cholecalciferol 25 MCG (1000 UT) tablet Commonly known as: VITAMIN D3 Take 1,000 Units by mouth daily.   diphenhydrAMINE 25 mg capsule Commonly known as: BENADRYL Take 1 capsule (25 mg total) by mouth every 6 (six) hours as needed for itching, allergies or sleep.   Fish Oil 1000 MG Caps Take 1,000 mg by mouth daily.   fluticasone 50 MCG/ACT nasal spray Commonly known as: FLONASE INSTILL 1 SPRAY IN EACH NOSTRIL DAILY What changed:   how much to take  how to take this  when to take this  reasons to take this  additional instructions   furosemide 40 MG tablet Commonly known as: LASIX TAKE ONE TABLET BY MOUTH DAILY AS NEEDED What changed: reasons to take this   hydrochlorothiazide 25 MG tablet Commonly known as: HYDRODIURIL TAKE 1 TABLET BY MOUTH ONCE DAILY   hydrOXYzine 25 MG capsule Commonly known as: VISTARIL Take 2 capsules (50 mg total) by mouth daily.   insulin lispro 100 UNIT/ML KwikPen Commonly known as: HumaLOG KwikPen If blood sugar is < 175 Do not take any correction insulin. 176-225 inject 2 units. 226-275 inject 4 units. [id number] inject 6 units. 326-375 inject 8 units. 376-425 inject 10 units. 426-475 inject 12 units. [id number] inject 14 units and call office for further instructions. What changed:   how much to take  how to take  this  when to take this  additional instructions   Insulin Pen Needle 32G X 4 MM Misc Use to inject insulin 4 times a day and semaglutide once weekly   Insulin Syringe-Needle U-100 31G X 15/64" 0.3 ML Misc Use with Humalog to correct blood sugar before meals  three times a day   ipratropium 0.02 % nebulizer solution Commonly known as: ATROVENT USE 1 VIAL VIA NEBULIZER EVERY 6 HOURS AS NEEDED FOR WHEEZING   metFORMIN 500 MG 24 hr tablet Commonly known as: GLUCOPHAGE-XR TAKE 1 TABLET BY MOUTH EVERY DAY WITH BREAKFAST What changed: See the new instructions.   multivitamin with minerals Tabs tablet Take 1 tablet by mouth daily.   Myrbetriq 50 MG Tb24 tablet Generic drug: mirabegron ER TAKE 1 TABLET BY MOUTH DAILY   nystatin powder Commonly known as: nystatin APPLY TO AFFECTED AREA THREE TIMES A DAY What changed:   how much to take  how to take this  when to take this   omeprazole 20 MG capsule Commonly known as: PRILOSEC TAKE 1 CAPSULE (20 MG TOTAL) BY MOUTH 2 (TWO) TIMES DAILY BEFORE A MEAL.   OneTouch Verio test strip Generic drug: glucose blood USE TO TEST BLOOD SUGAR 3-4 TIMES DAILY   oxyCODONE-acetaminophen 5-325 MG tablet Commonly known as: Percocet Take 1 tablet by mouth every 4 (four) hours as needed for severe pain. What changed: Another medication with the same name was changed. Make sure you understand how and when to take each. Changed by: Inocencio Homes, MD   oxyCODONE-acetaminophen 5-325 MG tablet Commonly known as: Percocet Take 1 tablet by mouth 4 (four) times daily as needed for severe pain. What changed: when to take this Changed by: Inocencio Homes, MD   Ozempic (0.25 or 0.5 MG/DOSE) 2 MG/1.5ML Sopn Generic drug: Semaglutide(0.25 or 0.5MG /DOS) INJECT 0.5 MG INTO THE SKIN ONCE A WEEK.   pregabalin 200 MG capsule Commonly known as: LYRICA TAKE 1 CAPSULE BY MOUTH THREE TIMES A DAY What changed: See the new instructions.   rosuvastatin 20  MG tablet Commonly known as: CRESTOR TAKE 1 TABLET BY MOUTH AT BEDTIME What changed: when to take this   traZODone 100 MG tablet Commonly known as: DESYREL Take 100 mg by mouth at bedtime as needed for sleep.   [name] FlexTouch 200 UNIT/ML Sopn Generic drug: Insulin Degludec Inject 70 Units into the skin daily.   vitamin B-12 500 MCG tablet Commonly known as: CYANOCOBALAMIN Take 500 mcg by mouth daily.   vitamin C 500 MG tablet Commonly known as: ASCORBIC ACID Take 500 mg by mouth daily.       Meds ordered this encounter  Medications  . oxyCODONE-acetaminophen (PERCOCET) 5-325 MG tablet    Sig: Take 1 tablet by mouth 4 (four) times daily as needed for severe pain.    Dispense:  56 tablet    Refill:  0    Immunization History  Administered Date(s) Administered  . Influenza Split 10/16/2011, 09/23/2012  . Influenza Whole 08/20/2010  . Influenza,inj,Quad PF,6+ Mos 09/01/2013, 10/05/2014, 09/06/2015, 09/25/2016, 09/24/2017, 10/21/2018, 10/19/2019  . Pneumococcal Polysaccharide-23 10/16/2011  . Td 07/15/2010    Social History   Tobacco Use  . Smoking status: Current Every Day Smoker    Packs/day: 1.00    Years: 50.00    Pack years: 50.00    Types: Cigarettes  . Smokeless tobacco: Never Used  . Tobacco comment: 1 PPD  Substance Use Topics  . Alcohol use: No    Alcohol/week: 0.0 standard drinks    Review of Systems  DATA OBTAINED: from patient, nurse, medical record, family member GENERAL:  no fevers, fatigue, appetite changes SKIN: No itching, rash HEENT: No complaint RESPIRATORY: No cough, wheezing, SOB CARDIAC: No chest pain, palpitations, lower extremity edema  GI: No abdominal pain, No N/V/D or  constipation, No heartburn or reflux  GU: No dysuria, frequency or urgency, or incontinence  MUSCULOSKELETAL: No unrelieved bone/joint pain NEUROLOGIC: No headache, dizziness  PSYCHIATRIC: No overt anxiety or sadness  There were no vitals filed for this  visit. There is no height or weight on file to calculate BMI. Physical Exam  GENERAL APPEARANCE: Alert, conversant, No acute distress  SKIN: No diaphoresis rash HEENT: Unremarkable RESPIRATORY: Breathing is even, unlabored. Lung sounds are clear   CARDIOVASCULAR: Heart RRR no murmurs, rubs or gallops. No peripheral edema  GASTROINTESTINAL: Abdomen is soft, non-tender, not distended w/ normal bowel sounds.  GENITOURINARY: Bladder non tender, not distended  MUSCULOSKELETAL: No abnormal joints or musculature NEUROLOGIC: Cranial nerves 2-12 grossly intact. Moves all extremities PSYCHIATRIC: Mood and affect appropriate to situation, no behavioral issues  Patient Active Problem List   Diagnosis Date Noted  . Encounter for power mobility device assessment 11/24/2019  . Pain in joint of right shoulder 11/11/2019  . Nontraumatic incomplete tear of right rotator cuff   . CKD (chronic kidney disease) stage 3, GFR 30-59 ml/min 10/18/2019  . 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 Skin rash 06/30/2019  . Impingement syndrome of right shoulder 06/22/2019  . Urinary incontinence 05/13/2018  . OSA (obstructive sleep apnea) 06/12/2017  . Morbid obesity with BMI of 40.0-44.9, adult ([place]) 03/26/2017  . Toe amputation status, right 01/16/2017  . Thrombocytopenia ([place]) 06/05/2016  . 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 Type 2 diabetes with nephropathy ([place]) 09/07/2015  . Uncontrolled diabetes mellitus with retinopathy, due to underlying condition, without macular edema ([place]) 09/05/2015  . Diabetic retinopathy ([place]) 09/05/2015  . Counseling regarding end of life decision making 06/14/2015  . Atherosclerosis of aorta ([place]) 04/04/2015  . Anemia 10/05/2014  . Chronic diastolic heart failure ([place])   . Hx of BKA, left ([place])   . Tobacco abuse   . Severe obesity (BMI >= 40) ([place]) 03/02/2013  . Abnormality of gait 03/01/2013  . Healthcare maintenance 07/10/2012  . Chronic prescription opiate use 12/03/2011  . Secondary  diabetes mellitus with peripheral vascular disease ([place]) 08/27/2011  . 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 ([place]) 11/29/2009  . Hypertension associated with diabetes ([place]) 11/29/2009  . Chronic insomnia 10/25/2009  . GASTROESOPHAGEAL REFLUX DISEASE 11/24/2008  . Depression, major, severe recurrence ([place]) 04/06/2008  . DM (diabetes mellitus) type II uncontrolled, periph vascular disorder ([place]) 04/02/2007  . Hyperlipidemia associated with type 2 diabetes mellitus ([place]) 01/08/2007  . COPD exacerbation ([place]) 01/08/2007    CMP     Component Value Date/Time   NA 139 11/11/2019 0803   NA 144 08/24/2019 1524   K 4.1 11/11/2019 0803   CL 103 11/11/2019 0803   CO2 24 11/11/2019 0803   GLUCOSE 175 (H) 11/11/2019 0803   BUN 12 11/11/2019 0803   BUN 14 08/24/2019 1524   CREATININE 0.88 11/11/2019 0803   CREATININE 0.68 01/31/2015 1641   CALCIUM 9.7 11/11/2019 0803   PROT 7.8 10/17/2019 0015   PROT 6.5 08/24/2019 1524   ALBUMIN 2.8 (L) 10/17/2019 0015   ALBUMIN 3.8 08/24/2019 1524   AST 22 10/17/2019 0015   ALT 18 10/17/2019 0015   ALKPHOS 162 (H) 10/17/2019 0015   BILITOT 0.6 10/17/2019 0015   BILITOT <0.2 08/24/2019 1524   GFRNONAA >60 11/11/2019 0803   GFRNONAA >89 01/31/2015 1641   GFRAA >60 11/11/2019 0803   GFRAA >89 01/31/2015 1641   Recent Labs    04/05/19 2100  10/18/19 0525 10/19/19 0418 11/11/19 0803  NA 138   < >  142 140 139  K 4.8   < > 3.8 3.8 4.1  CL 102   < > 105 104 103  CO2 26   < > 24 25 24   GLUCOSE 170*   < > 153* 136* 175*  BUN 22*   < > 16 16 12   CREATININE 1.19*   < > 1.24* 1.01* 0.88  CALCIUM 8.8*   < > 8.8* 9.0 9.7  PHOS 3.5  --   --   --   --    < > = values in this interval not displayed.   Recent Labs    05/14/19 0521 08/24/19 1524 10/17/19 0015  AST 26 16 22   ALT 23 12 18   ALKPHOS 72 77 162*  BILITOT 0.5 <0.2 0.6  PROT 6.8 6.5 7.8  ALBUMIN 3.1* 3.8 2.8*   Recent Labs    05/11/19 1330  10/17/19 0015  10/18/19 0525  10/19/19 0418 11/11/19 0803  WBC 12.6*   < > 20.1*   < > 18.9* 15.3* 9.0  NEUTROABS 6.3  --  15.8*  --   --   --   --   HGB 12.2   < > 12.6   < > 12.4 11.8* 14.1  HCT 37.8   < > 39.4   < > 38.8 36.8 45.5  MCV 96.4   < > 94.0   < > 95.1 92.2 97.6  PLT 157   < > 189   < > 188 216 148*   < > = values in this interval not displayed.   No results for input(s): CHOL, LDLCALC, TRIG in the last 8760 hours.  Invalid input(s): HCL Lab Results  Component Value Date   MICROALBUR 56.5 (H) 10/05/2014   Lab Results  Component Value Date   TSH 0.135 (L) 04/05/2019   Lab Results  Component Value Date   HGBA1C 9.3 (H) 11/11/2019   Lab Results  Component Value Date   CHOL 139 06/05/2016   HDL 34 (L) 06/05/2016   LDLCALC 77 06/05/2016   TRIG 142 06/05/2016   CHOLHDL 4.1 06/05/2016    Significant Diagnostic Results in last 30 days:  No results found.  Assessment and Plan  No problem-specific Assessment & Plan notes found for this encounter.   Labs/tests ordered:    Hennie Duos, MD

## 2019-11-25 NOTE — Progress Notes (Signed)
Location:   [place] of Service:     Bartholomew Crews, MD  Patient Care Team: Bartholomew Crews, MD as PCP - General (Internal Medicine) Thelma Comp, [place] (Optometry) [place], Heather, [place] as Consulting Physician (Optometry) Newt Minion, MD as Attending Physician (Orthopedic Surgery) Lucas Mallow, MD as Consulting Physician (Urology)  Extended Emergency Contact Information Primary Emergency Contact: Pennie Banter of [place] Phone: [phone] Relation: Father Secondary Emergency Contact: Awilda [place] of [place] Mobile Phone: [phone] Relation: Son    Allergies: Iohexol, Ivp dye [iodinated diagnostic agents], Abilify [aripiprazole], and Morphine sulfate  Chief Complaint  Patient presents with  . Acute Visit    HPI: Patient is 58 y.o. female who   Past Medical History:  Diagnosis Date  . Anginal pain [facility])    '3' of 10 ischemia ruled out 9/9   . Arthritis of lumbar spine   . Asthma   . Cataract   . CHF (congestive heart failure) ([place])   . 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 Chronic bronchitis ([place])    "I get it alot" (09/28/2013)  . Chronic diastolic heart failure (HCC)    grade 2 per 2D echocardiogram (01/2013)  . Chronic lower back pain   . Chronic osteomyelitis of foot (HCC)    chronic, right secondary to diabetic foot ulcers  . Chronic pain syndrome 12/03/2011   Likely secondary to depression, "fibromyalgia", neuropathy, and obesity. Lumbar MRI 2014 no sig change from prior (2008) : Stable hypertrophic facet disease most notable at L4-5. Stable shallow left foraminal/extraforaminal disc protrusion at L4-5. No direct neural compression.      Marland Kitchen COPD 01/08/2007   PFT's 05/2007 : FEV1/FVC 82, FEV1 64% pred, FEF 25-75% 40% predicted, 16% improvement in FEV1 with bronchodilators.     . Depression   . Diabetes mellitus without complication ([place])    Type II  . Diabetic peripheral  neuropathy ([place])   . DVT of upper extremity (deep vein thrombosis) ([place]) 03/11/2013   Secondary to PICC line. Right brachial vein, diagnosed on 03/10/2013 Coumadin for 3 months. End date 06/10/2013   . Dyspnea    "smoker"  . Environmental allergies    Hx: of  . Exertional shortness of breath   . Fatty liver 2003   observed on ultrasound abdomen  . Fibromyalgia   . GERD (gastroesophageal reflux disease)   . Glaucoma   . History of use of hearing aid   . Hyperlipidemia   . Hyperplastic colon polyp 12/2010   Per colonoscopy (12/2010) - Dr. Deatra Ina  . Hypertension   . 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 Blood cultures 01/26/13 S. Aureus and GBS. Blood cultures Feb 6th, 8th, and 9th and March were negative.Repeat TEE 3/20 negative for vegitations  . 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 2/2 tobacco use, DM, and cont weight bearing on surgical wound and developed gangrene   . Pneumonia   . Polymicrobial bacterial infection 01/2013   GBS and S. aureus bacteremia // Source likely infected diabetic foot ulcer  . PVD (peripheral vascular disease) with claudication ([place])    Stents to bilateral common iliac arteries (left 2005, right 2008), on chronic plavix  . Rheumatoid arthritis ([place])   . S/P BKA (below knee amputation) unilateral [facility])    Oct 2014 L -  failed limb preserving treatment. 2/2 tobacco use, DM, and cont weight bearing on surgical wound and developed gangrene   . Tobacco abuse   . Type II diabetes mellitus with peripheral circulatory disorders, uncontrolled DX: 1993   Insulin dep. Poor control. Complicated by diabetic foot ulcer and diabetic eye disease.    Marland Kitchen Ulcer of foot, chronic (HCC)    Left. No OM per MRI (01/2013)    Past Surgical History:  Procedure Laterality Date  . ABDOMINAL HYSTERECTOMY  1997   secondary to uterine fibroids  . AMPUTATION  Left 08/31/2013   Procedure: AMPUTATION RAY;  Surgeon: Newt Minion, MD;  Location: [place];  Service: Orthopedics;  Laterality: Left;  Left Foot 5th Ray Amputation  . AMPUTATION Left 09/28/2013   Procedure: Left Midfoot amputation;  Surgeon: Newt Minion, MD;  Location: [place];  Service: Orthopedics;  Laterality: Left;  Left Midfoot amputation  . AMPUTATION Left 10/14/2013   Procedure: AMPUTATION BELOW KNEE- left;  Surgeon: Newt Minion, MD;  Location: [place];  Service: Orthopedics;  Laterality: Left;  Left Below Knee Amputation   . AMPUTATION TOE Right 01/15/2017   Procedure: AMPUTATION 5th TOE RIGHT FOOT;  Surgeon: Edrick Kins, DPM;  Location: [place];  Service: Podiatry;  Laterality: Right;  . APPLICATION OF WOUND VAC  04/01/2019   Procedure: Application Of Wound Vac;  Surgeon: Newt Minion, MD;  Location: [place];  Service: Orthopedics;;  . BLADDER SURGERY     bladder reconstruction surgery  . BREAST BIOPSY     multiple-benign per pt  . COLONOSCOPY    . ESOPHAGOGASTRODUODENOSCOPY N/A 09/20/2013   Procedure: ESOPHAGOGASTRODUODENOSCOPY (EGD);  Surgeon: Jerene Bears, MD;  Location: [place];  Service: Gastroenterology;  Laterality: N/A;  . FOOT AMPUTATION THROUGH METATARSAL Left 09/28/2013  . GANGLION CYST EXCISION     multiple  . PERIPHERAL VASCULAR INTERVENTION     stents in lower ext  . SHOULDER ARTHROSCOPY Right 11/11/2019   RIGHT SHOULDER ARTHROSCOPY AND DEBRIDEMENT   . SHOULDER ARTHROSCOPY Right 11/11/2019   Procedure: RIGHT SHOULDER ARTHROSCOPY AND DEBRIDEMENT;  Surgeon: Newt Minion, MD;  Location: [place];  Service: Orthopedics;  Laterality: Right;  . SHOULDER ARTHROSCOPY W/ ROTATOR CUFF REPAIR Bilateral   . SKIN SPLIT GRAFT Bilateral 05/13/2013   Procedure: Right and Left Foot Allograft Skin Graft;  Surgeon: Newt Minion, MD;  Location: [place];  Service: Orthopedics;  Laterality: Bilateral;  Right and Left Foot Allograft Skin Graft  . STUMP REVISION Left 04/01/2019   Procedure:  REVISION LEFT BELOW KNEE AMPUTATION;  Surgeon: Newt Minion, MD;  Location: [place];  Service: Orthopedics;  Laterality: Left;  . TEE WITHOUT CARDIOVERSION N/A 01/31/2013   Procedure: TRANSESOPHAGEAL ECHOCARDIOGRAM (TEE);  Surgeon: Fay Records, MD;  Location: [place];  Service: Cardiovascular;  Laterality: N/A;  Rm [phone]  . TEE WITHOUT CARDIOVERSION N/A 03/10/2013   Procedure: TRANSESOPHAGEAL ECHOCARDIOGRAM (TEE);  Surgeon: Larey Dresser, MD;  Location: [place];  Service: Cardiovascular;  Laterality: N/A;  Rm. [phone]  . TOE AMPUTATION Left 08/31/2013   4TH & 5 TH TOE   . TONSILLECTOMY    . TUBAL LIGATION    . WRIST SURGERY Right    "for tumors" (09/28/2013)    Allergies as of 11/25/2019      Reactions   Iohexol Other (See Comments)    Desc: IV CONTRAST CAUSE NEPHROPATHY IN 2007   Ivp Dye [iodinated Diagnostic Agents] Other (See Comments)   Desc:  IV CONTRAST CAUSE NEPHROPATHY IN 2007   Abilify [aripiprazole] Other (See Comments)   Urinary freq Nov 2016   Morphine Sulfate Itching, Rash      Medication List       Accurate as of November 25, 2019  4:10 PM. If you have any questions, ask your nurse or doctor.        albuterol (2.5 MG/3ML) 0.083% nebulizer solution Commonly known as: PROVENTIL INHALE THE CONTENTS OF 1 VIAL VIA NEBULIZER EVERY 6 HOURS AS NEEDED FOR WHEEZING What changed: See the new instructions.   ProAir HFA 108 (90 Base) MCG/ACT inhaler Generic drug: albuterol INHALE 2 PUFFS BY MOUTH EVERY 6 HOURS AS NEEDED FOR WHEEZING What changed: See the new instructions.   amLODipine 10 MG tablet Commonly known as: NORVASC Take 1 tablet (10 mg total) by mouth daily.   Anoro Ellipta 62.5-25 MCG/INH Aepb Generic drug: umeclidinium-vilanterol TAKE 1 PUFF BY MOUTH EVERY DAY What changed: See the new instructions.   aspirin EC 81 MG tablet Take 81 mg by mouth daily.   atenolol 100 MG tablet Commonly known as: TENORMIN TAKE 1 TABLET BY MOUTH EVERY DAY     baclofen 10 MG tablet Commonly known as: LIORESAL TAKE 1 TABLET BY MOUTH THREE TIMES A DAY AS NEEDED FOR MUSCLE SPASMS What changed: See the new instructions.   benazepril 40 MG tablet Commonly known as: LOTENSIN Take 1 tablet (40 mg total) by mouth daily.   benzonatate 100 MG capsule Commonly known as: Tessalon Perles Take 1 capsule (100 mg total) by mouth at bedtime as needed for cough.   betamethasone dipropionate 0.05 % cream Apply 1 application topically 2 (two) times daily as needed (irritation).   Biotin 5 MG Tabs Take 5 mg by mouth daily.   buPROPion 300 MG 24 hr tablet Commonly known as: WELLBUTRIN XL TAKE 1 TABLET BY MOUTH DAILY   cholecalciferol 25 MCG (1000 UT) tablet Commonly known as: VITAMIN D3 Take 1,000 Units by mouth daily.   diphenhydrAMINE 25 mg capsule Commonly known as: BENADRYL Take 1 capsule (25 mg total) by mouth every 6 (six) hours as needed for itching, allergies or sleep.   Fish Oil 1000 MG Caps Take 1,000 mg by mouth daily.   fluticasone 50 MCG/ACT nasal spray Commonly known as: FLONASE INSTILL 1 SPRAY IN EACH NOSTRIL DAILY What changed:   how much to take  how to take this  when to take this  reasons to take this  additional instructions   furosemide 40 MG tablet Commonly known as: LASIX TAKE ONE TABLET BY MOUTH DAILY AS NEEDED What changed: reasons to take this   hydrochlorothiazide 25 MG tablet Commonly known as: HYDRODIURIL TAKE 1 TABLET BY MOUTH ONCE DAILY   hydrOXYzine 25 MG capsule Commonly known as: VISTARIL Take 2 capsules (50 mg total) by mouth daily.   insulin lispro 100 UNIT/ML KwikPen Commonly known as: HumaLOG KwikPen If blood sugar is < 175 Do not take any correction insulin. 176-225 inject 2 units. 226-275 inject 4 units. [id number] inject 6 units. 326-375 inject 8 units. 376-425 inject 10 units. 426-475 inject 12 units. [id number] inject 14 units and call office for further instructions. What changed:   how  much to take  how to take this  when to take this  additional instructions   Insulin Pen Needle 32G X 4 MM Misc Use to inject insulin 4 times a day and semaglutide once weekly   Insulin Syringe-Needle U-100 31G X 15/64" 0.3 ML Misc  Use with Humalog to correct blood sugar before meals three times a day   ipratropium 0.02 % nebulizer solution Commonly known as: ATROVENT USE 1 VIAL VIA NEBULIZER EVERY 6 HOURS AS NEEDED FOR WHEEZING   metFORMIN 500 MG 24 hr tablet Commonly known as: GLUCOPHAGE-XR TAKE 1 TABLET BY MOUTH EVERY DAY WITH BREAKFAST What changed: See the new instructions.   multivitamin with minerals Tabs tablet Take 1 tablet by mouth daily.   Myrbetriq 50 MG Tb24 tablet Generic drug: mirabegron ER TAKE 1 TABLET BY MOUTH DAILY   nystatin powder Commonly known as: nystatin APPLY TO AFFECTED AREA THREE TIMES A DAY What changed:   how much to take  how to take this  when to take this   omeprazole 20 MG capsule Commonly known as: PRILOSEC TAKE 1 CAPSULE (20 MG TOTAL) BY MOUTH 2 (TWO) TIMES DAILY BEFORE A MEAL.   OneTouch Verio test strip Generic drug: glucose blood USE TO TEST BLOOD SUGAR 3-4 TIMES DAILY   oxyCODONE-acetaminophen 5-325 MG tablet Commonly known as: Percocet Take 1 tablet by mouth every 4 (four) hours as needed for severe pain. What changed: Another medication with the same name was changed. Make sure you understand how and when to take each. Changed by: Inocencio Homes, MD   oxyCODONE-acetaminophen 5-325 MG tablet Commonly known as: Percocet Take 1 tablet by mouth 4 (four) times daily as needed for severe pain. What changed: when to take this Changed by: Inocencio Homes, MD   Ozempic (0.25 or 0.5 MG/DOSE) 2 MG/1.5ML Sopn Generic drug: Semaglutide(0.25 or 0.5MG /DOS) INJECT 0.5 MG INTO THE SKIN ONCE A WEEK.   pregabalin 200 MG capsule Commonly known as: LYRICA TAKE 1 CAPSULE BY MOUTH THREE TIMES A DAY What changed: See the new  instructions.   rosuvastatin 20 MG tablet Commonly known as: CRESTOR TAKE 1 TABLET BY MOUTH AT BEDTIME What changed: when to take this   traZODone 100 MG tablet Commonly known as: DESYREL Take 100 mg by mouth at bedtime as needed for sleep.   [name] FlexTouch 200 UNIT/ML Sopn Generic drug: Insulin Degludec Inject 70 Units into the skin daily.   vitamin B-12 500 MCG tablet Commonly known as: CYANOCOBALAMIN Take 500 mcg by mouth daily.   vitamin C 500 MG tablet Commonly known as: ASCORBIC ACID Take 500 mg by mouth daily.       No orders of the defined types were placed in this encounter.   Immunization History  Administered Date(s) Administered  . Influenza Split 10/16/2011, 09/23/2012  . Influenza Whole 08/20/2010  . Influenza,inj,Quad PF,6+ Mos 09/01/2013, 10/05/2014, 09/06/2015, 09/25/2016, 09/24/2017, 10/21/2018, 10/19/2019  . Pneumococcal Polysaccharide-23 10/16/2011  . Td 07/15/2010    Social History   Tobacco Use  . Smoking status: Current Every Day Smoker    Packs/day: 1.00    Years: 50.00    Pack years: 50.00    Types: Cigarettes  . Smokeless tobacco: Never Used  . Tobacco comment: 1 PPD  Substance Use Topics  . Alcohol use: No    Alcohol/week: 0.0 standard drinks    Review of Systems  DATA OBTAINED: from patient, nurse, medical record, family member GENERAL:  no fevers, fatigue, appetite changes SKIN: No itching, rash HEENT: No complaint RESPIRATORY: No cough, wheezing, SOB CARDIAC: No chest pain, palpitations, lower extremity edema  GI: No abdominal pain, No N/V/D or constipation, No heartburn or reflux  GU: No dysuria, frequency or urgency, or incontinence  MUSCULOSKELETAL: No unrelieved bone/joint pain NEUROLOGIC: No headache, dizziness  PSYCHIATRIC: No overt anxiety or sadness  Vitals:   11/25/19 1609  BP: 135/72  Pulse: 78  Resp: 18  Temp: 97.9 F (36.6 C)   Body mass index is 40.1 kg/m. Physical Exam  GENERAL APPEARANCE:  Alert, conversant, No acute distress  SKIN: No diaphoresis rash HEENT: Unremarkable RESPIRATORY: Breathing is even, unlabored. Lung sounds are clear   CARDIOVASCULAR: Heart RRR no murmurs, rubs or gallops. No peripheral edema  GASTROINTESTINAL: Abdomen is soft, non-tender, not distended w/ normal bowel sounds.  GENITOURINARY: Bladder non tender, not distended  MUSCULOSKELETAL: No abnormal joints or musculature NEUROLOGIC: Cranial nerves 2-12 grossly intact. Moves all extremities PSYCHIATRIC: Mood and affect appropriate to situation, no behavioral issues  Patient Active Problem List   Diagnosis Date Noted  . Encounter for power mobility device assessment 11/24/2019  . Pain in joint of right shoulder 11/11/2019  . Nontraumatic incomplete tear of right rotator cuff   . CKD (chronic kidney disease) stage 3, GFR 30-59 ml/min 10/18/2019  . 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 Skin rash 06/30/2019  . Impingement syndrome of right shoulder 06/22/2019  . Urinary incontinence 05/13/2018  . OSA (obstructive sleep apnea) 06/12/2017  . Morbid obesity with BMI of 40.0-44.9, adult ([place]) 03/26/2017  . Toe amputation status, right 01/16/2017  . Thrombocytopenia ([place]) 06/05/2016  . 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 Type 2 diabetes with nephropathy ([place]) 09/07/2015  . Uncontrolled diabetes mellitus with retinopathy, due to underlying condition, without macular edema ([place]) 09/05/2015  . Diabetic retinopathy ([place]) 09/05/2015  . Counseling regarding end of life decision making 06/14/2015  . Atherosclerosis of aorta ([place]) 04/04/2015  . Anemia 10/05/2014  . Chronic diastolic heart failure ([place])   . Hx of BKA, left ([place])   . Tobacco abuse   . Severe obesity (BMI >= 40) ([place]) 03/02/2013  . Abnormality of gait 03/01/2013  . Healthcare maintenance 07/10/2012  . Chronic prescription opiate use 12/03/2011  . Secondary diabetes mellitus with peripheral vascular disease ([place]) 08/27/2011  . Glaucoma due to type 2 diabetes  mellitus ([place]) 11/29/2009  . Hypertension associated with diabetes ([place]) 11/29/2009  . Chronic insomnia 10/25/2009  . GASTROESOPHAGEAL REFLUX DISEASE 11/24/2008  . Depression, major, severe recurrence ([place]) 04/06/2008  . DM (diabetes mellitus) type II uncontrolled, periph vascular disorder ([place]) 04/02/2007  . Hyperlipidemia associated with type 2 diabetes mellitus ([place]) 01/08/2007  . COPD exacerbation ([place]) 01/08/2007    CMP     Component Value Date/Time   NA 139 11/11/2019 0803   NA 144 08/24/2019 1524   K 4.1 11/11/2019 0803   CL 103 11/11/2019 0803   CO2 24 11/11/2019 0803   GLUCOSE 175 (H) 11/11/2019 0803   BUN 12 11/11/2019 0803   BUN 14 08/24/2019 1524   CREATININE 0.88 11/11/2019 0803   CREATININE 0.68 01/31/2015 1641   CALCIUM 9.7 11/11/2019 0803   PROT 7.8 10/17/2019 0015   PROT 6.5 08/24/2019 1524   ALBUMIN 2.8 (L) 10/17/2019 0015   ALBUMIN 3.8 08/24/2019 1524   AST 22 10/17/2019 0015   ALT 18 10/17/2019 0015   ALKPHOS 162 (H) 10/17/2019 0015   BILITOT 0.6 10/17/2019 0015   BILITOT <0.2 08/24/2019 1524   GFRNONAA >60 11/11/2019 0803   GFRNONAA >89 01/31/2015 1641   GFRAA >60 11/11/2019 0803   GFRAA >89 01/31/2015 1641   Recent Labs    04/05/19 2100  10/18/19 0525 10/19/19 0418 11/11/19 0803  NA 138   < > 142 140 139  K 4.8   < > 3.8 3.8  4.1  CL 102   < > 105 104 103  CO2 26   < > 24 25 24   GLUCOSE 170*   < > 153* 136* 175*  BUN 22*   < > 16 16 12   CREATININE 1.19*   < > 1.24* 1.01* 0.88  CALCIUM 8.8*   < > 8.8* 9.0 9.7  PHOS 3.5  --   --   --   --    < > = values in this interval not displayed.   Recent Labs    05/14/19 0521 08/24/19 1524 10/17/19 0015  AST 26 16 22   ALT 23 12 18   ALKPHOS 72 77 162*  BILITOT 0.5 <0.2 0.6  PROT 6.8 6.5 7.8  ALBUMIN 3.1* 3.8 2.8*   Recent Labs    05/11/19 1330  10/17/19 0015  10/18/19 0525 10/19/19 0418 11/11/19 0803  WBC 12.6*   < > 20.1*   < > 18.9* 15.3* 9.0  NEUTROABS 6.3  --  15.8*  --   --   --    --   HGB 12.2   < > 12.6   < > 12.4 11.8* 14.1  HCT 37.8   < > 39.4   < > 38.8 36.8 45.5  MCV 96.4   < > 94.0   < > 95.1 92.2 97.6  PLT 157   < > 189   < > 188 216 148*   < > = values in this interval not displayed.   No results for input(s): CHOL, LDLCALC, TRIG in the last 8760 hours.  Invalid input(s): HCL Lab Results  Component Value Date   MICROALBUR 56.5 (H) 10/05/2014   Lab Results  Component Value Date   TSH 0.135 (L) 04/05/2019   Lab Results  Component Value Date   HGBA1C 9.3 (H) 11/11/2019   Lab Results  Component Value Date   CHOL 139 06/05/2016   HDL 34 (L) 06/05/2016   LDLCALC 77 06/05/2016   TRIG 142 06/05/2016   CHOLHDL 4.1 06/05/2016    Significant Diagnostic Results in last 30 days:  No results found.  Assessment and Plan  No problem-specific Assessment & Plan notes found for this encounter.   Labs/tests ordered:    Hennie Duos, MD   This encounter was created in error - please disregard.

## 2019-11-25 NOTE — Progress Notes (Signed)
Post-Op Visit Note   Patient: Jennifer [name]           Date of Birth: 08/15/1961           MRN: [id number] Visit Date: 11/25/2019 PCP: Bartholomew Crews, MD  Chief Complaint:  Chief Complaint  Patient presents with  . Right Shoulder - Routine Post Op    11/11/19 right shoulder scope and debridement     HPI:  HPI The patient is a 58 year old woman seen today 2 weeks status post right shoulder arthroscopy with debridement for a complete tear of the right rotator cuff she is residing at skilled nursing.  Today she remains in a sling. Ortho Exam Portals are clean and dry there is no surrounding erythema no sign of infection patient quite resistant to exam she only allows me to abduct her shoulder 90 degrees.  Visit Diagnoses:  1. Nontraumatic complete tear of right rotator cuff     Plan: Discussed the importance of range of motion of the shoulder.  Discontinue sling.  Weightbearing of the right upper extremity as tolerated therapy to progress with range of motion as their discretion  Follow-Up Instructions: No follow-ups on file.   Imaging: No results found.  Orders:  No orders of the defined types were placed in this encounter.  No orders of the defined types were placed in this encounter.    PMFS History: Patient Active Problem List   Diagnosis Date Noted  . Encounter for power mobility device assessment 11/24/2019  . Pain in joint of right shoulder 11/11/2019  . Nontraumatic incomplete tear of right rotator cuff   . CKD (chronic kidney disease) stage 3, GFR 30-59 ml/min 10/18/2019  . 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 Skin rash 06/30/2019  . Impingement syndrome of right shoulder 06/22/2019  . Urinary incontinence 05/13/2018  . OSA (obstructive sleep apnea) 06/12/2017  . Morbid obesity with BMI of 40.0-44.9, adult ([place]) 03/26/2017  . Toe amputation status, right 01/16/2017  . Thrombocytopenia ([place]) 06/05/2016  . 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 Type 2 diabetes with  nephropathy ([place]) 09/07/2015  . Uncontrolled diabetes mellitus with retinopathy, due to underlying condition, without macular edema ([place]) 09/05/2015  . Diabetic retinopathy ([place]) 09/05/2015  . Counseling regarding end of life decision making 06/14/2015  . Atherosclerosis of aorta ([place]) 04/04/2015  . Anemia 10/05/2014  . Chronic diastolic heart failure ([place])   . Hx of BKA, left ([place])   . Tobacco abuse   . Severe obesity (BMI >= 40) ([place]) 03/02/2013  . Abnormality of gait 03/01/2013  . Healthcare maintenance 07/10/2012  . Chronic prescription opiate use 12/03/2011  . Secondary diabetes mellitus with peripheral vascular disease ([place]) 08/27/2011  . 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 Hypertension associated with diabetes ([place]) 11/29/2009  . Chronic insomnia 10/25/2009  . GASTROESOPHAGEAL REFLUX DISEASE 11/24/2008  . Depression, major, severe recurrence ([place]) 04/06/2008  . DM (diabetes mellitus) type II uncontrolled, periph vascular disorder ([place]) 04/02/2007  . Hyperlipidemia associated with type 2 diabetes mellitus ([place]) 01/08/2007  . COPD exacerbation ([place]) 01/08/2007   Past Medical History:  Diagnosis Date  . Anginal pain [facility])    '3' of 10 ischemia ruled out 9/9   . Arthritis of lumbar spine   . Asthma   . Cataract   . CHF (congestive heart failure) ([place])   . 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 Chronic bronchitis ([place])    "I get it alot" (09/28/2013)  .  Chronic diastolic heart failure (HCC)    grade 2 per 2D echocardiogram (01/2013)  . Chronic lower back pain   . Chronic osteomyelitis of foot (HCC)    chronic, right secondary to diabetic foot ulcers  . Chronic pain syndrome 12/03/2011   Likely secondary to depression, "fibromyalgia", neuropathy, and obesity. Lumbar MRI 2014 no sig change from prior (2008) : Stable hypertrophic facet disease most notable at L4-5. Stable shallow left foraminal/extraforaminal disc protrusion at L4-5. No  direct neural compression.      Marland Kitchen COPD 01/08/2007   PFT's 05/2007 : FEV1/FVC 82, FEV1 64% pred, FEF 25-75% 40% predicted, 16% improvement in FEV1 with bronchodilators.     . Depression   . Diabetes mellitus without complication ([place])    Type II  . Diabetic peripheral neuropathy ([place])   . DVT of upper extremity (deep vein thrombosis) ([place]) 03/11/2013   Secondary to PICC line. Right brachial vein, diagnosed on 03/10/2013 Coumadin for 3 months. End date 06/10/2013   . Dyspnea    "smoker"  . Environmental allergies    Hx: of  . Exertional shortness of breath   . Fatty liver 2003   observed on ultrasound abdomen  . Fibromyalgia   . GERD (gastroesophageal reflux disease)   . Glaucoma   . History of use of hearing aid   . Hyperlipidemia   . Hyperplastic colon polyp 12/2010   Per colonoscopy (12/2010) - Dr. Deatra Ina  . Hypertension   . 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 Blood cultures 01/26/13 S. Aureus and GBS. Blood cultures Feb 6th, 8th, and 9th and March were negative.Repeat TEE 3/20 negative for vegitations  . 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 2/2 tobacco use, DM, and cont weight bearing on surgical wound and developed gangrene   . Pneumonia   . Polymicrobial bacterial infection 01/2013   GBS and S. aureus bacteremia // Source likely infected diabetic foot ulcer  . PVD (peripheral vascular disease) with claudication ([place])    Stents to bilateral common iliac arteries (left 2005, right 2008), on chronic plavix  . Rheumatoid arthritis ([place])   . S/P BKA (below knee amputation) unilateral [facility])    Oct 2014 L - failed limb preserving treatment. 2/2 tobacco use, DM, and cont weight bearing on surgical wound and developed gangrene   . Tobacco abuse   . Type II diabetes mellitus with peripheral circulatory disorders, uncontrolled DX: 1993   Insulin dep. Poor  control. Complicated by diabetic foot ulcer and diabetic eye disease.    Marland Kitchen Ulcer of foot, chronic (HCC)    Left. No OM per MRI (01/2013)    Family History  Problem Relation Age of Onset  . Diverticulosis Mother   . Diabetes Mother   . Hypertension Mother   . Congestive Heart Failure Mother   . Asthma Father   . CAD Sister 8       MI at age 19 per patient.  However, she has not had a stent or CABG.   . Heart disease Sister        before age 52  . Breast cancer Neg Hx     Past Surgical History:  Procedure Laterality Date  . ABDOMINAL HYSTERECTOMY  1997   secondary to uterine fibroids  . AMPUTATION Left 08/31/2013   Procedure: AMPUTATION RAY;  Surgeon: Newt Minion, MD;  Location: [place];  Service: Orthopedics;  Laterality: Left;  Left Foot 5th Ray Amputation  . AMPUTATION Left 09/28/2013   Procedure: Left Midfoot amputation;  Surgeon: Newt Minion, MD;  Location: [place];  Service: Orthopedics;  Laterality: Left;  Left Midfoot amputation  . AMPUTATION Left 10/14/2013   Procedure: AMPUTATION BELOW KNEE- left;  Surgeon: Newt Minion, MD;  Location: [place];  Service: Orthopedics;  Laterality: Left;  Left Below Knee Amputation   . AMPUTATION TOE Right 01/15/2017   Procedure: AMPUTATION 5th TOE RIGHT FOOT;  Surgeon: Edrick Kins, DPM;  Location: [place];  Service: Podiatry;  Laterality: Right;  . APPLICATION OF WOUND VAC  04/01/2019   Procedure: Application Of Wound Vac;  Surgeon: Newt Minion, MD;  Location: [place];  Service: Orthopedics;;  . BLADDER SURGERY     bladder reconstruction surgery  . BREAST BIOPSY     multiple-benign per pt  . COLONOSCOPY    . ESOPHAGOGASTRODUODENOSCOPY N/A 09/20/2013   Procedure: ESOPHAGOGASTRODUODENOSCOPY (EGD);  Surgeon: Jerene Bears, MD;  Location: [place];  Service: Gastroenterology;  Laterality: N/A;  . FOOT AMPUTATION THROUGH METATARSAL Left 09/28/2013  . GANGLION CYST EXCISION     multiple  . PERIPHERAL VASCULAR INTERVENTION     stents in lower  ext  . SHOULDER ARTHROSCOPY Right 11/11/2019   RIGHT SHOULDER ARTHROSCOPY AND DEBRIDEMENT   . SHOULDER ARTHROSCOPY Right 11/11/2019   Procedure: RIGHT SHOULDER ARTHROSCOPY AND DEBRIDEMENT;  Surgeon: Newt Minion, MD;  Location: [place];  Service: Orthopedics;  Laterality: Right;  . SHOULDER ARTHROSCOPY W/ ROTATOR CUFF REPAIR Bilateral   . SKIN SPLIT GRAFT Bilateral 05/13/2013   Procedure: Right and Left Foot Allograft Skin Graft;  Surgeon: Newt Minion, MD;  Location: [place];  Service: Orthopedics;  Laterality: Bilateral;  Right and Left Foot Allograft Skin Graft  . STUMP REVISION Left 04/01/2019   Procedure: REVISION LEFT BELOW KNEE AMPUTATION;  Surgeon: Newt Minion, MD;  Location: [place];  Service: Orthopedics;  Laterality: Left;  . TEE WITHOUT CARDIOVERSION N/A 01/31/2013   Procedure: TRANSESOPHAGEAL ECHOCARDIOGRAM (TEE);  Surgeon: Fay Records, MD;  Location: [place];  Service: Cardiovascular;  Laterality: N/A;  Rm [phone]  . TEE WITHOUT CARDIOVERSION N/A 03/10/2013   Procedure: TRANSESOPHAGEAL ECHOCARDIOGRAM (TEE);  Surgeon: Larey Dresser, MD;  Location: [place];  Service: Cardiovascular;  Laterality: N/A;  Rm. [phone]  . TOE AMPUTATION Left 08/31/2013   4TH & 5 TH TOE   . TONSILLECTOMY    . TUBAL LIGATION    . WRIST SURGERY Right    "for tumors" (09/28/2013)   Social History   Occupational History  . Occupation: Disability    Comment: previously worked as a Engineer, materials  . Smoking status: Current Every Day Smoker    Packs/day: 1.00    Years: 50.00    Pack years: 50.00    Types: Cigarettes  . Smokeless tobacco: Never Used  . Tobacco comment: 1 PPD  Substance and Sexual Activity  . Alcohol use: No    Alcohol/week: 0.0 standard drinks  . Drug use: No    Types: Marijuana, "Crack" cocaine    Comment: 09/28/2013 "no marijuana since 2011, no crack/cocaine 1989"  . Sexual activity: Not Currently

## 2019-11-26 ENCOUNTER — Encounter: Payer: Self-pay | Admitting: Internal Medicine

## 2019-11-29 ENCOUNTER — Other Ambulatory Visit: Payer: Self-pay | Admitting: Internal Medicine

## 2019-11-29 MED ORDER — PREGABALIN 200 MG PO CAPS: 200.0000 mg | ORAL_CAPSULE | Freq: Three times a day (TID) | ORAL | 0 refills | Status: DC

## 2019-11-29 NOTE — Progress Notes (Signed)
Internal Medicine Clinic Attending  Case discussed with Dr. Krienke at the time of the visit.  We reviewed the resident's history and exam and pertinent patient test results.  I agree with the assessment, diagnosis, and plan of care documented in the resident's note.    

## 2019-12-01 ENCOUNTER — Non-Acute Institutional Stay (SKILLED_NURSING_FACILITY): Payer: Medicare Other | Admitting: Internal Medicine

## 2019-12-01 ENCOUNTER — Encounter: Payer: Self-pay | Admitting: Internal Medicine

## 2019-12-01 DIAGNOSIS — J42 Unspecified chronic bronchitis: Secondary | ICD-10-CM

## 2019-12-01 DIAGNOSIS — E1121 Type 2 diabetes mellitus with diabetic nephropathy: Secondary | ICD-10-CM

## 2019-12-01 DIAGNOSIS — IMO0002 Reserved for concepts with insufficient information to code with codable children: Secondary | ICD-10-CM

## 2019-12-01 DIAGNOSIS — E1151 Type 2 diabetes mellitus with diabetic peripheral angiopathy without gangrene: Secondary | ICD-10-CM | POA: Diagnosis not present

## 2019-12-01 DIAGNOSIS — I10 Essential (primary) hypertension: Secondary | ICD-10-CM

## 2019-12-01 DIAGNOSIS — E1159 Type 2 diabetes mellitus with other circulatory complications: Secondary | ICD-10-CM | POA: Diagnosis not present

## 2019-12-01 DIAGNOSIS — E1165 Type 2 diabetes mellitus with hyperglycemia: Secondary | ICD-10-CM

## 2019-12-01 NOTE — Progress Notes (Signed)
Location:  [place] Room Number: 103-P Place of Service:  SNF [phone]) Provider:  Granville Lewis, PA-C  Patient Care Team: Bartholomew Crews, MD as PCP - General (Internal Medicine) Thelma Comp, [place] (Optometry) [place], Heather, [place] as Consulting Physician (Optometry) Newt Minion, MD as Attending Physician (Orthopedic Surgery) Lucas Mallow, MD as Consulting Physician (Urology)  Extended Emergency Contact Information Primary Emergency Contact: Pennie Banter of [place] Phone: [phone] Relation: Father Secondary Emergency Contact: [place] of [place] Mobile Phone: [phone] Relation: Son  Code Status:  Full Code Goals of care: Advanced Directive information Advanced Directives 11/11/2019  Does Patient Have a Medical Advance Directive? -  Type of Advance Directive -  Does patient want to make changes to medical advance directive? -  Copy of [place] in Chart? -  Would patient like information on creating a medical advance directive? No - Patient declined  Pre-existing out of facility DNR order (yellow form or pink MOST form) -     Chief Complaint  Patient presents with  . Discharge Note    Patient is seen for discharge from [facility] on 12/03/19    HPI:  Pt is a 58 y.o. female seen today for discharge.  Scheduled for Saturday, December 12.  She will be going home with family who is supportive she will need PT and OT for continued therapy.  She is here for rehab after having a right shoulder rotator cuff repair.  She had an arthroplasty and debridement.  She has progressed well and actually seen orthopedics and thought to be doing well.  Her other issues include diastolic CHF currently not on a diuretic she also has a history of diabetes is on Tresiba Glucophage Humalog sliding scale and Ozempic-blood sugars at times are somewhat elevated in the 2-300 range-she  apparently has had spotty prior control and this will warrant follow-up by primary care provider would be hesitant to change medications right before discharge  In regards to hypertension she is on Norvasc atenolol benazepril and hydrochlorothiazide she has somewhat variable blood pressures as well as see recent ranges ranging from 115/68 up to 155/97.  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  She says a ramp is being built at her home to help with assessability.  Currently she appears to be feeling much better and in much better spirits than when she first arrived-she is pleased with her progress with therapy she would really like to stay longer with therapy but apparently insurance issues are largely prompting this discharge.      Past Medical History:  Diagnosis Date  . Anginal pain [facility])    '3' of 10 ischemia ruled out 9/9   . Arthritis of lumbar spine   . Asthma   . Cataract   . CHF (congestive heart failure) ([place])   . 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 Chronic bronchitis ([place])    "I get it alot" (09/28/2013)  . Chronic diastolic heart failure (HCC)    grade 2 per 2D echocardiogram (01/2013)  . Chronic lower back pain   . Chronic osteomyelitis of foot (HCC)    chronic, right secondary to diabetic foot ulcers  . Chronic pain syndrome 12/03/2011  Likely secondary to depression, "fibromyalgia", neuropathy, and obesity. Lumbar MRI 2014 no sig change from prior (2008) : Stable hypertrophic facet disease most notable at L4-5. Stable shallow left foraminal/extraforaminal disc protrusion at L4-5. No direct neural compression.      Marland Kitchen COPD 01/08/2007   PFT's 05/2007 : FEV1/FVC 82, FEV1 64% pred, FEF 25-75% 40% predicted, 16% improvement in FEV1 with bronchodilators.     .  Depression   . Diabetes mellitus without complication ([place])    Type II  . Diabetic peripheral neuropathy ([place])   . DVT of upper extremity (deep vein thrombosis) ([place]) 03/11/2013   Secondary to PICC line. Right brachial vein, diagnosed on 03/10/2013 Coumadin for 3 months. End date 06/10/2013   . Dyspnea    "smoker"  . Environmental allergies    Hx: of  . Exertional shortness of breath   . Fatty liver 2003   observed on ultrasound abdomen  . Fibromyalgia   . GERD (gastroesophageal reflux disease)   . Glaucoma   . History of use of hearing aid   . Hyperlipidemia   . Hyperplastic colon polyp 12/2010   Per colonoscopy (12/2010) - Dr. Deatra Ina  . Hypertension   . 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 Blood cultures 01/26/13 S. Aureus and GBS. Blood cultures Feb 6th, 8th, and 9th and March were negative.Repeat TEE 3/20 negative for vegitations  . 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 2/2 tobacco use, DM, and cont weight bearing on surgical wound and developed gangrene   . Pneumonia   . Polymicrobial bacterial infection 01/2013   GBS and S. aureus bacteremia // Source likely infected diabetic foot ulcer  . PVD (peripheral vascular disease) with claudication ([place])    Stents to bilateral common iliac arteries (left 2005, right 2008), on chronic plavix  . Rheumatoid arthritis ([place])   . S/P BKA (below knee amputation) unilateral [facility])    Oct 2014 L - failed limb preserving treatment. 2/2 tobacco use, DM, and cont weight bearing on surgical wound and developed gangrene   . Tobacco abuse   . Type II diabetes mellitus with peripheral circulatory disorders, uncontrolled DX: 1993   Insulin dep. Poor control. Complicated by diabetic foot ulcer and diabetic eye disease.    Marland Kitchen Ulcer of foot, chronic (HCC)    Left. No OM per MRI (01/2013)   Past Surgical History:  Procedure  Laterality Date  . ABDOMINAL HYSTERECTOMY  1997   secondary to uterine fibroids  . AMPUTATION Left 08/31/2013   Procedure: AMPUTATION RAY;  Surgeon: Newt Minion, MD;  Location: [place];  Service: Orthopedics;  Laterality: Left;  Left Foot 5th Ray Amputation  . AMPUTATION Left 09/28/2013   Procedure: Left Midfoot amputation;  Surgeon: Newt Minion, MD;  Location: [place];  Service: Orthopedics;  Laterality: Left;  Left Midfoot amputation  . AMPUTATION Left 10/14/2013   Procedure: AMPUTATION BELOW KNEE- left;  Surgeon: Newt Minion, MD;  Location: [place];  Service: Orthopedics;  Laterality: Left;  Left Below Knee Amputation   . AMPUTATION TOE Right 01/15/2017   Procedure: AMPUTATION 5th TOE RIGHT FOOT;  Surgeon: Edrick Kins, DPM;  Location: [place];  Service: Podiatry;  Laterality: Right;  . APPLICATION OF WOUND VAC  04/01/2019   Procedure: Application Of Wound Vac;  Surgeon: Newt Minion, MD;  Location: [place];  Service: Orthopedics;;  . BLADDER  SURGERY     bladder reconstruction surgery  . BREAST BIOPSY     multiple-benign per pt  . COLONOSCOPY    . ESOPHAGOGASTRODUODENOSCOPY N/A 09/20/2013   Procedure: ESOPHAGOGASTRODUODENOSCOPY (EGD);  Surgeon: Jerene Bears, MD;  Location: [place];  Service: Gastroenterology;  Laterality: N/A;  . FOOT AMPUTATION THROUGH METATARSAL Left 09/28/2013  . GANGLION CYST EXCISION     multiple  . PERIPHERAL VASCULAR INTERVENTION     stents in lower ext  . SHOULDER ARTHROSCOPY Right 11/11/2019   RIGHT SHOULDER ARTHROSCOPY AND DEBRIDEMENT   . SHOULDER ARTHROSCOPY Right 11/11/2019   Procedure: RIGHT SHOULDER ARTHROSCOPY AND DEBRIDEMENT;  Surgeon: Newt Minion, MD;  Location: [place];  Service: Orthopedics;  Laterality: Right;  . SHOULDER ARTHROSCOPY W/ ROTATOR CUFF REPAIR Bilateral   . SKIN SPLIT GRAFT Bilateral 05/13/2013   Procedure: Right and Left Foot Allograft Skin Graft;  Surgeon: Newt Minion, MD;  Location: [place];  Service: Orthopedics;  Laterality:  Bilateral;  Right and Left Foot Allograft Skin Graft  . STUMP REVISION Left 04/01/2019   Procedure: REVISION LEFT BELOW KNEE AMPUTATION;  Surgeon: Newt Minion, MD;  Location: [place];  Service: Orthopedics;  Laterality: Left;  . TEE WITHOUT CARDIOVERSION N/A 01/31/2013   Procedure: TRANSESOPHAGEAL ECHOCARDIOGRAM (TEE);  Surgeon: Fay Records, MD;  Location: [place];  Service: Cardiovascular;  Laterality: N/A;  Rm [phone]  . TEE WITHOUT CARDIOVERSION N/A 03/10/2013   Procedure: TRANSESOPHAGEAL ECHOCARDIOGRAM (TEE);  Surgeon: Larey Dresser, MD;  Location: [place];  Service: Cardiovascular;  Laterality: N/A;  Rm. [phone]  . TOE AMPUTATION Left 08/31/2013   4TH & 5 TH TOE   . TONSILLECTOMY    . TUBAL LIGATION    . WRIST SURGERY Right    "for tumors" (09/28/2013)    Allergies  Allergen Reactions  . Iohexol Other (See Comments)     Desc: IV CONTRAST CAUSE NEPHROPATHY IN 2007   . Ivp Dye [Iodinated Diagnostic Agents] Other (See Comments)    Desc: IV CONTRAST CAUSE NEPHROPATHY IN 2007  . Abilify [Aripiprazole] Other (See Comments)    Urinary freq Nov 2016  . Morphine Sulfate Itching and Rash    Outpatient Encounter Medications as of 12/01/2019  Medication Sig  . albuterol (PROVENTIL) (2.5 MG/3ML) 0.083% nebulizer solution INHALE THE CONTENTS OF 1 VIAL VIA NEBULIZER EVERY 6 HOURS AS NEEDED FOR WHEEZING  . amLODipine (NORVASC) 10 MG tablet Take 1 tablet (10 mg total) by mouth daily.  Jearl Klinefelter ELLIPTA 62.5-25 MCG/INH AEPB TAKE 1 PUFF BY MOUTH EVERY DAY  . aspirin EC 81 MG tablet Take 81 mg by mouth daily.  Marland Kitchen atenolol (TENORMIN) 100 MG tablet TAKE 1 TABLET BY MOUTH EVERY DAY  . baclofen (LIORESAL) 10 MG tablet TAKE 1 TABLET BY MOUTH THREE TIMES A DAY AS NEEDED FOR MUSCLE SPASMS  . benazepril (LOTENSIN) 40 MG tablet Take 1 tablet (40 mg total) by mouth daily.  . benzonatate (TESSALON PERLES) 100 MG capsule Take 1 capsule (100 mg total) by mouth at bedtime as needed for cough.  .  betamethasone dipropionate (DIPROLENE) 0.05 % cream Apply 1 application topically 2 (two) times daily as needed (irritation).  . Biotin 5 MG TABS Take 5 mg by mouth daily.  Marland Kitchen buPROPion (WELLBUTRIN XL) 300 MG 24 hr tablet TAKE 1 TABLET BY MOUTH DAILY  . cholecalciferol (VITAMIN D3) 25 MCG (1000 UT) tablet Take 1,000 Units by mouth daily.  . diphenhydrAMINE (BENADRYL) 25 mg capsule Take 1 capsule (25  mg total) by mouth every 6 (six) hours as needed for itching, allergies or sleep.  . fluticasone (FLONASE) 50 MCG/ACT nasal spray INSTILL 1 SPRAY IN EACH NOSTRIL DAILY  . hydrochlorothiazide (HYDRODIURIL) 25 MG tablet TAKE 1 TABLET BY MOUTH ONCE DAILY  . hydrOXYzine (VISTARIL) 25 MG capsule Take 2 capsules (50 mg total) by mouth daily.  . Insulin Degludec (TRESIBA FLEXTOUCH) 200 UNIT/ML SOPN Inject 70 Units into the skin daily.  . insulin lispro (HUMALOG KWIKPEN) 100 UNIT/ML KwikPen If blood sugar is < 175 Do not take any correction insulin. 176-225 inject 2 units. 226-275 inject 4 units. [id number] inject 6 units. 326-375 inject 8 units. 376-425 inject 10 units. 426-475 inject 12 units. [id number] inject 14 units and call office for further instructions.  . Insulin Pen Needle 32G X 4 MM MISC Use to inject insulin 4 times a day and semaglutide once weekly  . Insulin Syringe-Needle U-100 31G X 15/64" 0.3 ML MISC Use with Humalog to correct blood sugar before meals three times a day  . ipratropium (ATROVENT) 0.02 % nebulizer solution USE 1 VIAL VIA NEBULIZER EVERY 6 HOURS AS NEEDED FOR WHEEZING  . metFORMIN (GLUCOPHAGE-XR) 500 MG 24 hr tablet TAKE 1 TABLET BY MOUTH EVERY DAY WITH BREAKFAST  . Multiple Vitamin (MULTIVITAMIN WITH MINERALS) TABS tablet Take 1 tablet by mouth daily.  Marland Kitchen MYRBETRIQ 50 MG TB24 tablet TAKE 1 TABLET BY MOUTH DAILY  . Omega-3 Fatty Acids (FISH OIL) 1000 MG CAPS Take 1,000 mg by mouth daily.  Marland Kitchen omeprazole (PRILOSEC) 20 MG capsule TAKE 1 CAPSULE (20 MG TOTAL) BY MOUTH 2 (TWO) TIMES DAILY  BEFORE A MEAL.  Marland Kitchen ondansetron (ZOFRAN) 4 MG tablet Take 4 mg by mouth every 6 (six) hours.  Glory Rosebush VERIO test strip USE TO TEST BLOOD SUGAR 3-4 TIMES DAILY  . oxyCODONE-acetaminophen (PERCOCET/ROXICET) 5-325 MG tablet Take 1 tablet by mouth every 6 (six) hours as needed for severe pain. x14 days  . OZEMPIC, 0.25 OR 0.5 MG/DOSE, 2 MG/1.5ML SOPN INJECT 0.5 MG INTO THE SKIN ONCE A WEEK.  . pregabalin (LYRICA) 200 MG capsule Take 1 capsule (200 mg total) by mouth 3 (three) times daily. TAKE 1 CAPSULE BY MOUTH THREE TIMES A DAY  . PROAIR HFA 108 (90 Base) MCG/ACT inhaler INHALE 2 PUFFS BY MOUTH EVERY 6 HOURS AS NEEDED FOR WHEEZING  . promethazine (PHENERGAN) 25 MG tablet Take 25 mg by mouth every 6 (six) hours as needed for nausea or vomiting.  . rosuvastatin (CRESTOR) 20 MG tablet TAKE 1 TABLET BY MOUTH AT BEDTIME  . traZODone (DESYREL) 100 MG tablet Take 100 mg by mouth at bedtime as needed for sleep.   . vitamin B-12 (CYANOCOBALAMIN) 500 MCG tablet Take 500 mcg by mouth daily.  . vitamin C (ASCORBIC ACID) 500 MG tablet Take 500 mg by mouth daily.  . [DISCONTINUED] furosemide (LASIX) 40 MG tablet TAKE ONE TABLET BY MOUTH DAILY AS NEEDED  . [DISCONTINUED] nystatin (NYSTATIN) powder APPLY TO AFFECTED AREA THREE TIMES A DAY  . [DISCONTINUED] oxyCODONE-acetaminophen (PERCOCET) 5-325 MG tablet Take 1 tablet by mouth every 4 (four) hours as needed for severe pain.  . [DISCONTINUED] oxyCODONE-acetaminophen (PERCOCET) 5-325 MG tablet Take 1 tablet by mouth 4 (four) times daily as needed for severe pain.  . [DISCONTINUED] Potassium Chloride ER 20 MEQ TBCR Take 40 mEq by mouth daily.   No facility-administered encounter medications on file as of 12/01/2019.    Review of Systems   In general she is not  complaining of any fever chills appears to be in much better spirits feeling pretty well.  Her skin does not complain of any rashes or itching surgical site right shoulder appears to be healing  unremarkably.  Head ears eyes nose mouth and throat is not complain of visual changes sore throat or nasal discharge.  Respiratory is not complaining of being short of breath or having cough.  Cardiac denies chest pain has chronic right lower extremity edema she says is relatively unchanged.  GI is not complaining of abdominal discomfort nausea vomiting diarrhea constipation.  GU does not complain of dysuria.  Musculoskeletal does complain at times of pain more so over right shoulder but current medications apparently are helping.  Neurologic does not complain of dizziness headache numbness has gained strength and is weightbearing as tolerated on her right shoulder.  Psych does not complain currently of being depressed or anxious appears to be in good spirits would like to stay longer to get more therapy but understands insurance issues prompting this.    Immunization History  Administered Date(s) Administered  . Influenza Split 10/16/2011, 09/23/2012  . Influenza Whole 08/20/2010  . Influenza,inj,Quad PF,6+ Mos 09/01/2013, 10/05/2014, 09/06/2015, 09/25/2016, 09/24/2017, 10/21/2018, 10/19/2019  . Pneumococcal Polysaccharide-23 10/16/2011  . Td 07/15/2010   Pertinent  Health Maintenance Due  Topic Date Due  . PAP SMEAR-Modifier  08/17/2002  . LIPID PANEL  06/05/2017  . OPHTHALMOLOGY EXAM  07/17/2017  . FOOT EXAM  10/22/2019  . HEMOGLOBIN A1C  02/11/2020  . MAMMOGRAM  06/15/2020  . COLONOSCOPY  01/15/2021  . INFLUENZA VACCINE  Completed   Fall Risk  11/03/2019 08/24/2019 07/13/2019 06/30/2019 06/22/2019  Falls in the past year? 1 1 1 1 1   Number falls in past yr: 1 1 - 1 -  Injury with Fall? 1 0 - 1 1  Comment - NOT SURE - - -  Risk Factor Category  - - - - -  Risk for fall due to : Impaired balance/gait;Impaired mobility;History of fall(s) - History of fall(s);Impaired balance/gait History of fall(s);Impaired balance/gait;Impaired mobility History of fall(s);Impaired  balance/gait;Impaired mobility  Risk for fall due to: Comment - - - - -  Follow up Falls prevention discussed;Falls evaluation completed - Falls prevention discussed Falls prevention discussed Falls prevention discussed  Comment - - - - -   Functional Status Survey:    Vitals:   12/01/19 1508  BP: (!) 155/97  Pulse: 97  Resp: 18  Temp: 98 F (36.7 C)  TempSrc: Oral  Weight: 256 lb 4.8 oz (116.3 kg)  Height: 5\' 7"  (1.702 m)   Body mass index is 40.14 kg/m. Physical Exam   In general this is a pleasant female in no distress sitting comfortably in a wheelchair.  Her skin is warm and dry-the small surgical sites on her right shoulder appear to be healing unremarkably without any sign of infection.  Oropharynx is clear mucous membranes moist.  Eyes sclera and conjunctive are clear visual acuity appears to be intact.  Chest is clear to auscultation there is no labored breathing.  Heart is regular rate and rhythm with somewhat distant heart sounds she has moderate right lower extremity edema appears relatively unchanged from previous exam.  Abdomen is obese soft nontender with positive bowel sounds.  Musculoskeletal is status post left BKA with prosthesis in place she does have more movement of her right --- grip strength appears to be intact bilaterally as well as capillary refill  Neurologic cannot really appreciate any lateralizing findings  her speech is clear she is alert.  Psych she is alert and oriented very pleasant and appropriate.    Labs reviewed: Recent Labs    04/05/19 2100 05/11/19 1330 10/18/19 0525 10/19/19 0418 11/11/19 0803  NA 138   < > 142 140 139  K 4.8  --  3.8 3.8 4.1  CL 102  --  105 104 103  CO2 26  --  24 25 24   GLUCOSE 170*   < > 153* 136* 175*  BUN 22*   < > 16 16 12   CREATININE 1.19*  --  1.24* 1.01* 0.88  CALCIUM 8.8*  --  8.8* 9.0 9.7  PHOS 3.5  --   --   --   --    < > = values in this interval not displayed.   Recent Labs     05/14/19 0521 08/24/19 1524 10/17/19 0015  AST 26 16 22   ALT 23 12 18   ALKPHOS 72 77 162*  BILITOT 0.5 <0.2 0.6  PROT 6.8 6.5 7.8  ALBUMIN 3.1* 3.8 2.8*   Recent Labs    05/11/19 1330 05/11/19 1407 10/17/19 0015 10/17/19 0908 10/18/19 0525 10/19/19 0418 11/11/19 0803  WBC 12.6*   < > 20.1*   < > 18.9* 15.3* 9.0  NEUTROABS 6.3  --  15.8*  --   --   --   --   HGB 12.2   < > 12.6  --  12.4 11.8* 14.1  HCT 37.8   < > 39.4  --  38.8 36.8 45.5  MCV 96.4   < > 94.0   < > 95.1 92.2 97.6  PLT 157   < > 189   < > 188 216 148*   < > = values in this interval not displayed.   Lab Results  Component Value Date   TSH 0.135 (L) 04/05/2019   Lab Results  Component Value Date   HGBA1C 9.3 (H) 11/11/2019   Lab Results  Component Value Date   CHOL 139 06/05/2016   HDL 34 (L) 06/05/2016   LDLCALC 77 06/05/2016   TRIG 142 06/05/2016   CHOLHDL 4.1 06/05/2016    Significant Diagnostic Results in last 30 days:  No results found.  Assessment/Plan  --#1 history of right rotator cuff tear with repair and debridement-she has done well with this-she has been seen by orthopedics and thought to be doing well-she will need continued PT and OT when she goes home.  She continues on Percocet every 4 hours as needed for pain she also has orders for Lyrica 200 mg 3 times daily-she also has baclofen 10 mg 3 times daily as needed for muscle spasms.  2.  History of diastolic CHF this continues it appears clinically to be stable-she does not complain of any increased shortness of breath or edema she is currently not on a diuretic.  3.  History of type 2 diabetes continues on Tresiba 70 units a day as well as Glucophage extended release 500 mg a day as well as Ozempic 0.5 mg weekly and Humalog sliding scale-at times still has some elevated blood sugars as noted above-hemoglobin A1c was 9.3 in the hospital-this will warrant follow-up by primary care provider.  At this point would be hesitant to change  medication right before discharge.  4.  History of coronary artery disease she has not really complain of any chest pain this appears to be stable she does continue on Crestor 20 mg a day as well as aspirin  81 mg a day.  5.  Hypertension has some variable blood pressures as noted above with recent systolics from the low [id number] to mid 100s-she is on Norvasc 10 mg a day-benazepril 40 mg a day atenolol 100 mg a day as well as hydrochlorothiazide 25 mg a day.  6.  Depression she is on Wellbutrin 300 mg a day this appears to be quite stable she is in good spirits today and appears her progress with therapy is really helped with her mood.  7.  History of urinary spasms she is on Myrbetriq 50 mg a day.  8.  History of GERD she is on Prilosec 20 mg twice daily this is not really been an issue during her stay here.  9.  History of COPD she had a recent hospitalization for pneumonia but this is really been stable during her stay here she is on agents that include ProAir as well as albuterol as needed and Ellipta.  10.  Insomnia she is on trazodone 100 mg every night as needed.  Again she will be going home she will have continued PT and OT-at this point appears to have made good progress with her right shoulder issues.  She will need expedient follow-up by primary care provider for follow-up of her numerous issues including diabetes.  [id number] note greater than 30 minutes spent on this discharge summary-greater than 50% of time spent coordinating a plan of care for numerous diagnoses  Addendum.  December 02, 2019-.  We have obtained updated labs which show relative stability.  White count is 8.2 hemoglobin 13.1 platelets 135,000.  Sodium is 137 potassium 4.8 creatinine is 1.14

## 2019-12-02 ENCOUNTER — Telehealth: Payer: Self-pay | Admitting: Internal Medicine

## 2019-12-02 ENCOUNTER — Encounter: Payer: Self-pay | Admitting: Internal Medicine

## 2019-12-02 MED ORDER — METFORMIN HCL ER 500 MG PO TB24: ORAL_TABLET | ORAL | 0 refills | Status: DC

## 2019-12-02 MED ORDER — PREGABALIN 200 MG PO CAPS: 200.0000 mg | ORAL_CAPSULE | Freq: Three times a day (TID) | ORAL | 0 refills | Status: DC

## 2019-12-02 MED ORDER — BENZONATATE 100 MG PO CAPS: 100.0000 mg | ORAL_CAPSULE | Freq: Every evening | ORAL | 0 refills | Status: DC | PRN

## 2019-12-02 MED ORDER — IPRATROPIUM BROMIDE 0.02 % IN SOLN: RESPIRATORY_TRACT | 0 refills | Status: DC

## 2019-12-02 MED ORDER — ONDANSETRON HCL 4 MG PO TABS: 4.0000 mg | ORAL_TABLET | Freq: Four times a day (QID) | ORAL | 0 refills | Status: DC

## 2019-12-02 MED ORDER — OZEMPIC (0.25 OR 0.5 MG/DOSE) 2 MG/1.5ML ~~LOC~~ SOPN: 0.5000 mg | PEN_INJECTOR | SUBCUTANEOUS | 0 refills | Status: DC

## 2019-12-02 MED ORDER — INSULIN LISPRO (1 UNIT DIAL) 100 UNIT/ML (KWIKPEN): PEN_INJECTOR | SUBCUTANEOUS | 0 refills | Status: DC

## 2019-12-02 MED ORDER — ANORO ELLIPTA 62.5-25 MCG/INH IN AEPB: 1.0000 | INHALATION_SPRAY | Freq: Every day | RESPIRATORY_TRACT | 0 refills | Status: DC

## 2019-12-02 MED ORDER — PROMETHAZINE HCL 25 MG PO TABS: 25.0000 mg | ORAL_TABLET | Freq: Four times a day (QID) | ORAL | 0 refills | Status: DC | PRN

## 2019-12-02 MED ORDER — "INSULIN SYRINGE-NEEDLE U-100 31G X 15/64"" 0.3 ML MISC": 0 refills | Status: DC

## 2019-12-02 MED ORDER — ROSUVASTATIN CALCIUM 20 MG PO TABS: 20.0000 mg | ORAL_TABLET | Freq: Every day | ORAL | 0 refills | Status: DC

## 2019-12-02 MED ORDER — AMLODIPINE BESYLATE 10 MG PO TABS: 10.0000 mg | ORAL_TABLET | Freq: Every day | ORAL | 0 refills | Status: DC

## 2019-12-02 MED ORDER — ATENOLOL 100 MG PO TABS: 100.0000 mg | ORAL_TABLET | Freq: Every day | ORAL | 0 refills | Status: DC

## 2019-12-02 MED ORDER — BETAMETHASONE DIPROPIONATE 0.05 % EX CREA: 1.0000 "application " | TOPICAL_CREAM | Freq: Two times a day (BID) | CUTANEOUS | 0 refills | Status: DC | PRN

## 2019-12-02 MED ORDER — FISH OIL 1000 MG PO CAPS: 1000.0000 mg | ORAL_CAPSULE | Freq: Every day | ORAL | 0 refills | Status: DC

## 2019-12-02 MED ORDER — INSULIN PEN NEEDLE 32G X 4 MM MISC: 0 refills | Status: DC

## 2019-12-02 MED ORDER — VITAMIN D 25 MCG (1000 UNIT) PO TABS: 1000.0000 [IU] | ORAL_TABLET | Freq: Every day | ORAL | 0 refills | Status: DC

## 2019-12-02 MED ORDER — HYDROCHLOROTHIAZIDE 25 MG PO TABS: 25.0000 mg | ORAL_TABLET | Freq: Every day | ORAL | 0 refills | Status: DC

## 2019-12-02 MED ORDER — OXYCODONE-ACETAMINOPHEN 5-325 MG PO TABS: 1.0000 | ORAL_TABLET | Freq: Four times a day (QID) | ORAL | 0 refills | Status: DC | PRN

## 2019-12-02 MED ORDER — TRESIBA FLEXTOUCH 200 UNIT/ML ~~LOC~~ SOPN: 70.0000 [IU] | PEN_INJECTOR | Freq: Every day | SUBCUTANEOUS | 0 refills | Status: DC

## 2019-12-02 MED ORDER — MIRABEGRON ER 50 MG PO TB24: 50.0000 mg | ORAL_TABLET | Freq: Every day | ORAL | 0 refills | Status: DC

## 2019-12-02 MED ORDER — BUPROPION HCL ER (XL) 300 MG PO TB24: 300.0000 mg | ORAL_TABLET | Freq: Every day | ORAL | 0 refills | Status: DC

## 2019-12-02 MED ORDER — BENAZEPRIL HCL 40 MG PO TABS: 40.0000 mg | ORAL_TABLET | Freq: Every day | ORAL | 0 refills | Status: DC

## 2019-12-02 MED ORDER — TRAZODONE HCL 100 MG PO TABS: 100.0000 mg | ORAL_TABLET | Freq: Every evening | ORAL | 0 refills | Status: DC | PRN

## 2019-12-02 MED ORDER — OMEPRAZOLE 20 MG PO CPDR: 20.0000 mg | DELAYED_RELEASE_CAPSULE | Freq: Two times a day (BID) | ORAL | 0 refills | Status: DC

## 2019-12-02 MED ORDER — ALBUTEROL SULFATE (2.5 MG/3ML) 0.083% IN NEBU: 2.5000 mg | INHALATION_SOLUTION | Freq: Four times a day (QID) | RESPIRATORY_TRACT | 0 refills | Status: DC | PRN

## 2019-12-02 MED ORDER — HYDROXYZINE PAMOATE 25 MG PO CAPS: 50.0000 mg | ORAL_CAPSULE | Freq: Every day | ORAL | 0 refills | Status: DC

## 2019-12-02 MED ORDER — FLUTICASONE PROPIONATE 50 MCG/ACT NA SUSP: NASAL | 0 refills | Status: DC

## 2019-12-02 MED ORDER — ONETOUCH VERIO VI STRP: 1.0000 | ORAL_STRIP | 0 refills | Status: DC | PRN

## 2019-12-02 MED ORDER — BACLOFEN 10 MG PO TABS: ORAL_TABLET | ORAL | 0 refills | Status: DC

## 2019-12-02 NOTE — Telephone Encounter (Signed)
Need refill on oxyCODONE-acetaminophen (PERCOCET/ROXICET) 5-325 MG tablet  ;pt contact [phone]   [facility] #[id number] - [place], [place] [address].

## 2019-12-02 NOTE — Telephone Encounter (Addendum)
Pt with RIGHT SHOULDER ARTHROSCOPY AND DEBRIDEMENT on 11/11/19 .  Per chart, pt scheduled for  discharge from  [facility] on Sat 12/12.  Pt has appt with [name] on 12/15 and  [facility] on 12/18.  Will send refill request to pcp for review.   Per pharmacy percocet last filled 11/25 #30 (not [facility]).   Last rx from pcp was filled on 10/19/19 Pharmacy does not have rx on file for the #14 written 12/4 currently showing in medication list (historical med?)

## 2019-12-02 NOTE — Telephone Encounter (Signed)
I gave her 3 months refill at the end of October.  Additionally, she has gotten 145 pills, which is roughly 1-1/2 months supply, by other providers.  Therefore, she probably does not need a refill until February.  Could you please recheck the dates and ensure I have looked at this correctly?

## 2019-12-05 ENCOUNTER — Telehealth: Payer: Self-pay | Admitting: Internal Medicine

## 2019-12-05 NOTE — Telephone Encounter (Signed)
VO OK 

## 2019-12-05 NOTE — Telephone Encounter (Signed)
Thank you :)

## 2019-12-05 NOTE — Telephone Encounter (Signed)
Called Dwyane Dee PT, [facility] - requesting verbal order for "PT 2 times a week x 3 weeks also OT eval". VO given - if not appropriate, let me know. Thanks

## 2019-12-05 NOTE — Telephone Encounter (Signed)
BAYADA PT requesting VO  For pt/OT 2x a week for  3 weeks.

## 2019-12-05 NOTE — Telephone Encounter (Signed)
Pt called about pain medicine refill.  States she has only had 2 RX's refilled from her shoulder surgery and received 30 pills each time.  Pt states she only has 25 pain pills left.   This nurse called [facility] and spoke with the pharmacist who states pt has picked up #30 oxycodone pills from Dr. Terence Lux on 11/30.  Pharmacist also states pt had 3 months of oxycodone rx sent in at the end of October from Dr. [name], but has only picked up 1 of 1, last 2 months are on file/hold. Pt called back and informed she has refills remaining.  Pt appreciative and states she "was confused on where to get her Rx's filled after her shoulder surgery, the shoulder dr sent them to a different pharmacy". Pt states she wishes to cancel appt tomorrow with [name], but will be here for her appt on 12/09/19.   SChaplin, RN,BSN

## 2019-12-06 ENCOUNTER — Ambulatory Visit: Payer: Medicare Other | Admitting: Dietician

## 2019-12-08 ENCOUNTER — Telehealth: Payer: Self-pay | Admitting: *Deleted

## 2019-12-08 DIAGNOSIS — J441 Chronic obstructive pulmonary disease with (acute) exacerbation: Secondary | ICD-10-CM | POA: Diagnosis not present

## 2019-12-08 DIAGNOSIS — M5126 Other intervertebral disc displacement, lumbar region: Secondary | ICD-10-CM | POA: Diagnosis not present

## 2019-12-08 DIAGNOSIS — J181 Lobar pneumonia, unspecified organism: Secondary | ICD-10-CM | POA: Diagnosis not present

## 2019-12-08 DIAGNOSIS — G4733 Obstructive sleep apnea (adult) (pediatric): Secondary | ICD-10-CM | POA: Diagnosis not present

## 2019-12-08 DIAGNOSIS — I130 Hypertensive heart and chronic kidney disease with heart failure and stage 1 through stage 4 chronic kidney disease, or unspecified chronic kidney disease: Secondary | ICD-10-CM | POA: Diagnosis not present

## 2019-12-08 DIAGNOSIS — J9601 Acute respiratory failure with hypoxia: Secondary | ICD-10-CM | POA: Diagnosis not present

## 2019-12-08 DIAGNOSIS — J302 Other seasonal allergic rhinitis: Secondary | ICD-10-CM | POA: Diagnosis not present

## 2019-12-08 DIAGNOSIS — N183 Chronic kidney disease, stage 3 unspecified: Secondary | ICD-10-CM | POA: Diagnosis not present

## 2019-12-08 DIAGNOSIS — G894 Chronic pain syndrome: Secondary | ICD-10-CM | POA: Diagnosis not present

## 2019-12-08 DIAGNOSIS — M47816 Spondylosis without myelopathy or radiculopathy, lumbar region: Secondary | ICD-10-CM | POA: Diagnosis not present

## 2019-12-08 DIAGNOSIS — I5032 Chronic diastolic (congestive) heart failure: Secondary | ICD-10-CM | POA: Diagnosis not present

## 2019-12-08 DIAGNOSIS — K219 Gastro-esophageal reflux disease without esophagitis: Secondary | ICD-10-CM | POA: Diagnosis not present

## 2019-12-08 DIAGNOSIS — H409 Unspecified glaucoma: Secondary | ICD-10-CM | POA: Diagnosis not present

## 2019-12-08 DIAGNOSIS — E785 Hyperlipidemia, unspecified: Secondary | ICD-10-CM | POA: Diagnosis not present

## 2019-12-08 DIAGNOSIS — E1151 Type 2 diabetes mellitus with diabetic peripheral angiopathy without gangrene: Secondary | ICD-10-CM | POA: Diagnosis not present

## 2019-12-08 DIAGNOSIS — K760 Fatty (change of) liver, not elsewhere classified: Secondary | ICD-10-CM | POA: Diagnosis not present

## 2019-12-08 DIAGNOSIS — J440 Chronic obstructive pulmonary disease with acute lower respiratory infection: Secondary | ICD-10-CM | POA: Diagnosis not present

## 2019-12-08 DIAGNOSIS — J9811 Atelectasis: Secondary | ICD-10-CM | POA: Diagnosis not present

## 2019-12-08 DIAGNOSIS — E1142 Type 2 diabetes mellitus with diabetic polyneuropathy: Secondary | ICD-10-CM | POA: Diagnosis not present

## 2019-12-08 DIAGNOSIS — E1136 Type 2 diabetes mellitus with diabetic cataract: Secondary | ICD-10-CM | POA: Diagnosis not present

## 2019-12-08 DIAGNOSIS — E1122 Type 2 diabetes mellitus with diabetic chronic kidney disease: Secondary | ICD-10-CM | POA: Diagnosis not present

## 2019-12-08 DIAGNOSIS — M797 Fibromyalgia: Secondary | ICD-10-CM | POA: Diagnosis not present

## 2019-12-08 DIAGNOSIS — D72829 Elevated white blood cell count, unspecified: Secondary | ICD-10-CM | POA: Diagnosis not present

## 2019-12-08 DIAGNOSIS — M069 Rheumatoid arthritis, unspecified: Secondary | ICD-10-CM | POA: Diagnosis not present

## 2019-12-08 NOTE — Telephone Encounter (Signed)
Claiborne Billings, HHPT calls to state she just recently arrived at pt's home and pt states she has fallen 5+ times in last 2 days. She states she has no injuries but is checking w/ dr duda since she just had shoulder repair She has not checked cbg today, states she is taking her meds but had not taken any before PT arrived. Pt has appt 12/18 at 1015.

## 2019-12-09 ENCOUNTER — Telehealth: Payer: Self-pay | Admitting: Orthopedic Surgery

## 2019-12-09 ENCOUNTER — Other Ambulatory Visit (HOSPITAL_COMMUNITY)
Admission: RE | Admit: 2019-12-09 | Discharge: 2019-12-09 | Disposition: A | Discharge status: Home / Self Care | Payer: Medicare Other | Source: Ambulatory Visit | Attending: Internal Medicine | Admitting: Internal Medicine

## 2019-12-09 ENCOUNTER — Ambulatory Visit: Payer: Medicare Other

## 2019-12-09 ENCOUNTER — Telehealth: Payer: Self-pay | Admitting: Internal Medicine

## 2019-12-09 DIAGNOSIS — Z20828 Contact with and (suspected) exposure to other viral communicable diseases: Secondary | ICD-10-CM | POA: Diagnosis not present

## 2019-12-09 DIAGNOSIS — Z4781 Encounter for orthopedic aftercare following surgical amputation: Secondary | ICD-10-CM | POA: Diagnosis not present

## 2019-12-09 DIAGNOSIS — Z01812 Encounter for preprocedural laboratory examination: Secondary | ICD-10-CM | POA: Insufficient documentation

## 2019-12-09 NOTE — Telephone Encounter (Signed)
Called the patient to discuss her hyperglycemia. She has not taken any humalog since yesterday. She typically checks her CBG prior to meals but this time checked it 30 minutes after. She has been hospitalized for hyperglycemia once in the past and it was at the time of initial diagnosis. She has not since been hospitalized. This morning her CBG was around 250. She otherwise feels okay. We discussed giving 14 units of humalog now and rechecking her CBG in 2-3 hours to ensure it is coming down. I will follow-up with her in 2-3 hours to ensure her CBG is down trending. All questions and concerns addressed.

## 2019-12-09 NOTE — Telephone Encounter (Signed)
Order emailed to [facility] to advise that the pt is to d/c sling, work on ROM and can be wtbat on the RUE as tolerated by pt. Has follow up in the office on 12/27/19 to call with questions.

## 2019-12-09 NOTE — Telephone Encounter (Addendum)
Returned call to patient. States current cbg is 523 30 minutes after eating and wants to know how many units humalog to take. She has not yet taken her daily [place] because she has been busy. Stressed importance of taking [name] same time everyday to avoid these high blood sugars. Also, stressed importance of checking blood sugars prior to meals so she will know how many units of humalog to take. Patient states, "I feel fine." When asked about increased thirst or urination, states, "I'm always thirsty and always urinating." Patient took [place] while on phone with this RN. Please advise humalog dose. [name], BSN, RN-BC

## 2019-12-09 NOTE — Telephone Encounter (Signed)
Followed up with the patient about 1.5 hours after she took her insulin. Her CBG is down from 523 to 377. Discussed that the humalog will continue to work for the next 2.5 hours. Did not recommend taking anymore. We discussed how to use her insulin moving forward. She voices understanding. All questions and concerns addressed.

## 2019-12-09 NOTE — Telephone Encounter (Signed)
Call placed to Dr. Tarri Abernethy and relayed info below. He will call patient now. [name], BSN, RN-BC

## 2019-12-09 NOTE — Telephone Encounter (Signed)
Patient requesting a call back in reference to her insulin.

## 2019-12-09 NOTE — Telephone Encounter (Signed)
[facility] called needs post op protocol for rotary cuff surgery.  Please email to: [email or web address]

## 2019-12-10 ENCOUNTER — Other Ambulatory Visit: Payer: Self-pay | Admitting: Internal Medicine

## 2019-12-10 DIAGNOSIS — E1121 Type 2 diabetes mellitus with diabetic nephropathy: Secondary | ICD-10-CM

## 2019-12-10 LAB — NOVEL CORONAVIRUS, NAA (HOSP ORDER, SEND-OUT TO REF LAB; TAT 18-24 HRS): SARS-CoV-2, NAA: NOT DETECTED

## 2019-12-10 NOTE — Telephone Encounter (Signed)
Thank you for info 

## 2019-12-11 DIAGNOSIS — E1151 Type 2 diabetes mellitus with diabetic peripheral angiopathy without gangrene: Secondary | ICD-10-CM | POA: Diagnosis not present

## 2019-12-11 DIAGNOSIS — J302 Other seasonal allergic rhinitis: Secondary | ICD-10-CM | POA: Diagnosis not present

## 2019-12-11 DIAGNOSIS — M069 Rheumatoid arthritis, unspecified: Secondary | ICD-10-CM | POA: Diagnosis not present

## 2019-12-11 DIAGNOSIS — J181 Lobar pneumonia, unspecified organism: Secondary | ICD-10-CM | POA: Diagnosis not present

## 2019-12-11 DIAGNOSIS — M797 Fibromyalgia: Secondary | ICD-10-CM | POA: Diagnosis not present

## 2019-12-11 DIAGNOSIS — J9811 Atelectasis: Secondary | ICD-10-CM | POA: Diagnosis not present

## 2019-12-11 DIAGNOSIS — H409 Unspecified glaucoma: Secondary | ICD-10-CM | POA: Diagnosis not present

## 2019-12-11 DIAGNOSIS — I130 Hypertensive heart and chronic kidney disease with heart failure and stage 1 through stage 4 chronic kidney disease, or unspecified chronic kidney disease: Secondary | ICD-10-CM | POA: Diagnosis not present

## 2019-12-11 DIAGNOSIS — J9601 Acute respiratory failure with hypoxia: Secondary | ICD-10-CM | POA: Diagnosis not present

## 2019-12-11 DIAGNOSIS — N183 Chronic kidney disease, stage 3 unspecified: Secondary | ICD-10-CM | POA: Diagnosis not present

## 2019-12-11 DIAGNOSIS — K760 Fatty (change of) liver, not elsewhere classified: Secondary | ICD-10-CM | POA: Diagnosis not present

## 2019-12-11 DIAGNOSIS — E785 Hyperlipidemia, unspecified: Secondary | ICD-10-CM | POA: Diagnosis not present

## 2019-12-11 DIAGNOSIS — E1142 Type 2 diabetes mellitus with diabetic polyneuropathy: Secondary | ICD-10-CM | POA: Diagnosis not present

## 2019-12-11 DIAGNOSIS — D72829 Elevated white blood cell count, unspecified: Secondary | ICD-10-CM | POA: Diagnosis not present

## 2019-12-11 DIAGNOSIS — M47816 Spondylosis without myelopathy or radiculopathy, lumbar region: Secondary | ICD-10-CM | POA: Diagnosis not present

## 2019-12-11 DIAGNOSIS — J440 Chronic obstructive pulmonary disease with acute lower respiratory infection: Secondary | ICD-10-CM | POA: Diagnosis not present

## 2019-12-11 DIAGNOSIS — G4733 Obstructive sleep apnea (adult) (pediatric): Secondary | ICD-10-CM | POA: Diagnosis not present

## 2019-12-11 DIAGNOSIS — K219 Gastro-esophageal reflux disease without esophagitis: Secondary | ICD-10-CM | POA: Diagnosis not present

## 2019-12-11 DIAGNOSIS — E1122 Type 2 diabetes mellitus with diabetic chronic kidney disease: Secondary | ICD-10-CM | POA: Diagnosis not present

## 2019-12-11 DIAGNOSIS — I5032 Chronic diastolic (congestive) heart failure: Secondary | ICD-10-CM | POA: Diagnosis not present

## 2019-12-11 DIAGNOSIS — M5126 Other intervertebral disc displacement, lumbar region: Secondary | ICD-10-CM | POA: Diagnosis not present

## 2019-12-11 DIAGNOSIS — E1136 Type 2 diabetes mellitus with diabetic cataract: Secondary | ICD-10-CM | POA: Diagnosis not present

## 2019-12-11 DIAGNOSIS — J441 Chronic obstructive pulmonary disease with (acute) exacerbation: Secondary | ICD-10-CM | POA: Diagnosis not present

## 2019-12-11 DIAGNOSIS — G894 Chronic pain syndrome: Secondary | ICD-10-CM | POA: Diagnosis not present

## 2019-12-12 ENCOUNTER — Ambulatory Visit: Payer: Medicare Other

## 2019-12-12 ENCOUNTER — Other Ambulatory Visit (HOSPITAL_COMMUNITY): Payer: Medicare Other

## 2019-12-13 DIAGNOSIS — I5032 Chronic diastolic (congestive) heart failure: Secondary | ICD-10-CM | POA: Diagnosis not present

## 2019-12-13 DIAGNOSIS — J9811 Atelectasis: Secondary | ICD-10-CM | POA: Diagnosis not present

## 2019-12-13 DIAGNOSIS — D72829 Elevated white blood cell count, unspecified: Secondary | ICD-10-CM | POA: Diagnosis not present

## 2019-12-13 DIAGNOSIS — M47816 Spondylosis without myelopathy or radiculopathy, lumbar region: Secondary | ICD-10-CM | POA: Diagnosis not present

## 2019-12-13 DIAGNOSIS — M5126 Other intervertebral disc displacement, lumbar region: Secondary | ICD-10-CM | POA: Diagnosis not present

## 2019-12-13 DIAGNOSIS — K219 Gastro-esophageal reflux disease without esophagitis: Secondary | ICD-10-CM | POA: Diagnosis not present

## 2019-12-13 DIAGNOSIS — E785 Hyperlipidemia, unspecified: Secondary | ICD-10-CM | POA: Diagnosis not present

## 2019-12-13 DIAGNOSIS — G4733 Obstructive sleep apnea (adult) (pediatric): Secondary | ICD-10-CM | POA: Diagnosis not present

## 2019-12-13 DIAGNOSIS — J302 Other seasonal allergic rhinitis: Secondary | ICD-10-CM | POA: Diagnosis not present

## 2019-12-13 DIAGNOSIS — E1151 Type 2 diabetes mellitus with diabetic peripheral angiopathy without gangrene: Secondary | ICD-10-CM | POA: Diagnosis not present

## 2019-12-13 DIAGNOSIS — I130 Hypertensive heart and chronic kidney disease with heart failure and stage 1 through stage 4 chronic kidney disease, or unspecified chronic kidney disease: Secondary | ICD-10-CM | POA: Diagnosis not present

## 2019-12-13 DIAGNOSIS — M797 Fibromyalgia: Secondary | ICD-10-CM | POA: Diagnosis not present

## 2019-12-13 DIAGNOSIS — H409 Unspecified glaucoma: Secondary | ICD-10-CM | POA: Diagnosis not present

## 2019-12-13 DIAGNOSIS — M069 Rheumatoid arthritis, unspecified: Secondary | ICD-10-CM | POA: Diagnosis not present

## 2019-12-13 DIAGNOSIS — J440 Chronic obstructive pulmonary disease with acute lower respiratory infection: Secondary | ICD-10-CM | POA: Diagnosis not present

## 2019-12-13 DIAGNOSIS — J181 Lobar pneumonia, unspecified organism: Secondary | ICD-10-CM | POA: Diagnosis not present

## 2019-12-13 DIAGNOSIS — E1122 Type 2 diabetes mellitus with diabetic chronic kidney disease: Secondary | ICD-10-CM | POA: Diagnosis not present

## 2019-12-13 DIAGNOSIS — E1136 Type 2 diabetes mellitus with diabetic cataract: Secondary | ICD-10-CM | POA: Diagnosis not present

## 2019-12-13 DIAGNOSIS — N183 Chronic kidney disease, stage 3 unspecified: Secondary | ICD-10-CM | POA: Diagnosis not present

## 2019-12-13 DIAGNOSIS — E1142 Type 2 diabetes mellitus with diabetic polyneuropathy: Secondary | ICD-10-CM | POA: Diagnosis not present

## 2019-12-13 DIAGNOSIS — J9601 Acute respiratory failure with hypoxia: Secondary | ICD-10-CM | POA: Diagnosis not present

## 2019-12-13 DIAGNOSIS — J441 Chronic obstructive pulmonary disease with (acute) exacerbation: Secondary | ICD-10-CM | POA: Diagnosis not present

## 2019-12-13 DIAGNOSIS — G894 Chronic pain syndrome: Secondary | ICD-10-CM | POA: Diagnosis not present

## 2019-12-13 DIAGNOSIS — K760 Fatty (change of) liver, not elsewhere classified: Secondary | ICD-10-CM | POA: Diagnosis not present

## 2019-12-14 ENCOUNTER — Other Ambulatory Visit: Payer: Self-pay

## 2019-12-14 ENCOUNTER — Ambulatory Visit (HOSPITAL_BASED_OUTPATIENT_CLINIC_OR_DEPARTMENT_OTHER): Payer: Medicare Other | Attending: Internal Medicine | Admitting: Internal Medicine

## 2019-12-14 VITALS — Ht 67.0 in | Wt 253.0 lb

## 2019-12-14 DIAGNOSIS — J440 Chronic obstructive pulmonary disease with acute lower respiratory infection: Secondary | ICD-10-CM | POA: Diagnosis not present

## 2019-12-14 DIAGNOSIS — D72829 Elevated white blood cell count, unspecified: Secondary | ICD-10-CM | POA: Diagnosis not present

## 2019-12-14 DIAGNOSIS — M069 Rheumatoid arthritis, unspecified: Secondary | ICD-10-CM | POA: Diagnosis not present

## 2019-12-14 DIAGNOSIS — K219 Gastro-esophageal reflux disease without esophagitis: Secondary | ICD-10-CM | POA: Diagnosis not present

## 2019-12-14 DIAGNOSIS — J181 Lobar pneumonia, unspecified organism: Secondary | ICD-10-CM | POA: Diagnosis not present

## 2019-12-14 DIAGNOSIS — G894 Chronic pain syndrome: Secondary | ICD-10-CM | POA: Diagnosis not present

## 2019-12-14 DIAGNOSIS — G4733 Obstructive sleep apnea (adult) (pediatric): Secondary | ICD-10-CM | POA: Diagnosis present

## 2019-12-14 DIAGNOSIS — E785 Hyperlipidemia, unspecified: Secondary | ICD-10-CM | POA: Diagnosis not present

## 2019-12-14 DIAGNOSIS — E1136 Type 2 diabetes mellitus with diabetic cataract: Secondary | ICD-10-CM | POA: Diagnosis not present

## 2019-12-14 DIAGNOSIS — R0902 Hypoxemia: Secondary | ICD-10-CM | POA: Diagnosis not present

## 2019-12-14 DIAGNOSIS — I130 Hypertensive heart and chronic kidney disease with heart failure and stage 1 through stage 4 chronic kidney disease, or unspecified chronic kidney disease: Secondary | ICD-10-CM | POA: Diagnosis not present

## 2019-12-14 DIAGNOSIS — J9601 Acute respiratory failure with hypoxia: Secondary | ICD-10-CM | POA: Diagnosis not present

## 2019-12-14 DIAGNOSIS — H409 Unspecified glaucoma: Secondary | ICD-10-CM | POA: Diagnosis not present

## 2019-12-14 DIAGNOSIS — J9811 Atelectasis: Secondary | ICD-10-CM | POA: Diagnosis not present

## 2019-12-14 DIAGNOSIS — J441 Chronic obstructive pulmonary disease with (acute) exacerbation: Secondary | ICD-10-CM | POA: Diagnosis not present

## 2019-12-14 DIAGNOSIS — N183 Chronic kidney disease, stage 3 unspecified: Secondary | ICD-10-CM | POA: Diagnosis not present

## 2019-12-14 DIAGNOSIS — K760 Fatty (change of) liver, not elsewhere classified: Secondary | ICD-10-CM | POA: Diagnosis not present

## 2019-12-14 DIAGNOSIS — E1122 Type 2 diabetes mellitus with diabetic chronic kidney disease: Secondary | ICD-10-CM | POA: Diagnosis not present

## 2019-12-14 DIAGNOSIS — J302 Other seasonal allergic rhinitis: Secondary | ICD-10-CM | POA: Diagnosis not present

## 2019-12-14 DIAGNOSIS — I5032 Chronic diastolic (congestive) heart failure: Secondary | ICD-10-CM | POA: Diagnosis not present

## 2019-12-14 DIAGNOSIS — M47816 Spondylosis without myelopathy or radiculopathy, lumbar region: Secondary | ICD-10-CM | POA: Diagnosis not present

## 2019-12-14 DIAGNOSIS — E1142 Type 2 diabetes mellitus with diabetic polyneuropathy: Secondary | ICD-10-CM | POA: Diagnosis not present

## 2019-12-14 DIAGNOSIS — E1151 Type 2 diabetes mellitus with diabetic peripheral angiopathy without gangrene: Secondary | ICD-10-CM | POA: Diagnosis not present

## 2019-12-14 DIAGNOSIS — M5126 Other intervertebral disc displacement, lumbar region: Secondary | ICD-10-CM | POA: Diagnosis not present

## 2019-12-14 DIAGNOSIS — M797 Fibromyalgia: Secondary | ICD-10-CM | POA: Diagnosis not present

## 2019-12-19 ENCOUNTER — Other Ambulatory Visit: Payer: Self-pay

## 2019-12-19 ENCOUNTER — Other Ambulatory Visit (HOSPITAL_BASED_OUTPATIENT_CLINIC_OR_DEPARTMENT_OTHER): Payer: Self-pay

## 2019-12-19 ENCOUNTER — Ambulatory Visit (INDEPENDENT_AMBULATORY_CARE_PROVIDER_SITE_OTHER): Payer: Medicare Other | Admitting: Internal Medicine

## 2019-12-19 VITALS — BP 111/96 | HR 67 | Temp 97.9°F | Ht 67.0 in | Wt 256.1 lb

## 2019-12-19 DIAGNOSIS — G4733 Obstructive sleep apnea (adult) (pediatric): Secondary | ICD-10-CM

## 2019-12-19 DIAGNOSIS — M75121 Complete rotator cuff tear or rupture of right shoulder, not specified as traumatic: Secondary | ICD-10-CM

## 2019-12-19 DIAGNOSIS — K760 Fatty (change of) liver, not elsewhere classified: Secondary | ICD-10-CM | POA: Diagnosis not present

## 2019-12-19 DIAGNOSIS — E1142 Type 2 diabetes mellitus with diabetic polyneuropathy: Secondary | ICD-10-CM | POA: Diagnosis not present

## 2019-12-19 DIAGNOSIS — K219 Gastro-esophageal reflux disease without esophagitis: Secondary | ICD-10-CM | POA: Diagnosis not present

## 2019-12-19 DIAGNOSIS — E1136 Type 2 diabetes mellitus with diabetic cataract: Secondary | ICD-10-CM | POA: Diagnosis not present

## 2019-12-19 DIAGNOSIS — F17210 Nicotine dependence, cigarettes, uncomplicated: Secondary | ICD-10-CM | POA: Diagnosis not present

## 2019-12-19 DIAGNOSIS — E1151 Type 2 diabetes mellitus with diabetic peripheral angiopathy without gangrene: Secondary | ICD-10-CM | POA: Diagnosis not present

## 2019-12-19 DIAGNOSIS — J9811 Atelectasis: Secondary | ICD-10-CM | POA: Diagnosis not present

## 2019-12-19 DIAGNOSIS — M47816 Spondylosis without myelopathy or radiculopathy, lumbar region: Secondary | ICD-10-CM | POA: Diagnosis not present

## 2019-12-19 DIAGNOSIS — D72829 Elevated white blood cell count, unspecified: Secondary | ICD-10-CM | POA: Diagnosis not present

## 2019-12-19 DIAGNOSIS — H409 Unspecified glaucoma: Secondary | ICD-10-CM | POA: Diagnosis not present

## 2019-12-19 DIAGNOSIS — J302 Other seasonal allergic rhinitis: Secondary | ICD-10-CM | POA: Diagnosis not present

## 2019-12-19 DIAGNOSIS — E785 Hyperlipidemia, unspecified: Secondary | ICD-10-CM | POA: Diagnosis not present

## 2019-12-19 DIAGNOSIS — J181 Lobar pneumonia, unspecified organism: Secondary | ICD-10-CM | POA: Diagnosis not present

## 2019-12-19 DIAGNOSIS — E119 Type 2 diabetes mellitus without complications: Secondary | ICD-10-CM

## 2019-12-19 DIAGNOSIS — J441 Chronic obstructive pulmonary disease with (acute) exacerbation: Secondary | ICD-10-CM | POA: Diagnosis not present

## 2019-12-19 DIAGNOSIS — E1122 Type 2 diabetes mellitus with diabetic chronic kidney disease: Secondary | ICD-10-CM | POA: Diagnosis not present

## 2019-12-19 DIAGNOSIS — I5032 Chronic diastolic (congestive) heart failure: Secondary | ICD-10-CM | POA: Diagnosis not present

## 2019-12-19 DIAGNOSIS — J440 Chronic obstructive pulmonary disease with acute lower respiratory infection: Secondary | ICD-10-CM | POA: Diagnosis not present

## 2019-12-19 DIAGNOSIS — M797 Fibromyalgia: Secondary | ICD-10-CM | POA: Diagnosis not present

## 2019-12-19 DIAGNOSIS — M75111 Incomplete rotator cuff tear or rupture of right shoulder, not specified as traumatic: Secondary | ICD-10-CM

## 2019-12-19 DIAGNOSIS — G894 Chronic pain syndrome: Secondary | ICD-10-CM | POA: Diagnosis not present

## 2019-12-19 DIAGNOSIS — M069 Rheumatoid arthritis, unspecified: Secondary | ICD-10-CM | POA: Diagnosis not present

## 2019-12-19 DIAGNOSIS — M5126 Other intervertebral disc displacement, lumbar region: Secondary | ICD-10-CM | POA: Diagnosis not present

## 2019-12-19 DIAGNOSIS — J9601 Acute respiratory failure with hypoxia: Secondary | ICD-10-CM | POA: Diagnosis not present

## 2019-12-19 DIAGNOSIS — N183 Chronic kidney disease, stage 3 unspecified: Secondary | ICD-10-CM | POA: Diagnosis not present

## 2019-12-19 DIAGNOSIS — I130 Hypertensive heart and chronic kidney disease with heart failure and stage 1 through stage 4 chronic kidney disease, or unspecified chronic kidney disease: Secondary | ICD-10-CM | POA: Diagnosis not present

## 2019-12-19 NOTE — Patient Instructions (Addendum)
Ms. Killins continue working with physical and occupational therapy, follow up with orthopedic surgery of January 5th at 1:30 pm and continue to follow up with your pcp and [name] on January 21st.

## 2019-12-19 NOTE — Progress Notes (Signed)
Internal Medicine Clinic Attending  Case discussed with Dr. Winfrey  at the time of the visit.  We reviewed the resident's history and exam and pertinent patient test results.  I agree with the assessment, diagnosis, and plan of care documented in the resident's note.  

## 2019-12-19 NOTE — Progress Notes (Signed)
CC: HFU nontraumatic complete tear of right rotator cuff  HPI:  [name] is a 58 y.o. female with PMH below.   She is here today for HFU for nontraumatic complete tear of right rotator cuff.  She did not bring her meter and notes that she has a follow up with Dr. [name] and [name] next month for her diabetes.  She had a sleep study done which was negative for sleep apnea per pt, the official report is still pending in epic.    Please see A&P for status of the patient's chronic medical conditions  Past Medical History:  Diagnosis Date  . Anginal pain [facility])    '3' of 10 ischemia ruled out 9/9   . Arthritis of lumbar spine   . Asthma   . Cataract   . CHF (congestive heart failure) ([place])   . 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 Chronic bronchitis ([place])    "I get it alot" (09/28/2013)  . Chronic diastolic heart failure (HCC)    grade 2 per 2D echocardiogram (01/2013)  . Chronic lower back pain   . Chronic osteomyelitis of foot (HCC)    chronic, right secondary to diabetic foot ulcers  . Chronic pain syndrome 12/03/2011   Likely secondary to depression, "fibromyalgia", neuropathy, and obesity. Lumbar MRI 2014 no sig change from prior (2008) : Stable hypertrophic facet disease most notable at L4-5. Stable shallow left foraminal/extraforaminal disc protrusion at L4-5. No direct neural compression.      Marland Kitchen COPD 01/08/2007   PFT's 05/2007 : FEV1/FVC 82, FEV1 64% pred, FEF 25-75% 40% predicted, 16% improvement in FEV1 with bronchodilators.     . Depression   . Diabetes mellitus without complication ([place])    Type II  . Diabetic peripheral neuropathy ([place])   . DVT of upper extremity (deep vein thrombosis) ([place]) 03/11/2013   Secondary to PICC line. Right brachial vein, diagnosed on 03/10/2013 Coumadin for 3 months. End date 06/10/2013   . Dyspnea    "smoker"  . Environmental allergies    Hx: of  . Exertional shortness of breath   . Fatty liver 2003   observed on  ultrasound abdomen  . Fibromyalgia   . GERD (gastroesophageal reflux disease)   . Glaucoma   . History of use of hearing aid   . Hyperlipidemia   . Hyperplastic colon polyp 12/2010   Per colonoscopy (12/2010) - Dr. Deatra Ina  . Hypertension   . 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 Blood cultures 01/26/13 S. Aureus and GBS. Blood cultures Feb 6th, 8th, and 9th and March were negative.Repeat TEE 3/20 negative for vegitations  . 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 2/2 tobacco use, DM, and cont weight bearing on surgical wound and developed gangrene   . Pneumonia   . Polymicrobial bacterial infection 01/2013   GBS and S. aureus bacteremia // Source likely infected diabetic foot ulcer  . PVD (peripheral vascular disease) with claudication ([place])    Stents to bilateral common iliac arteries (left 2005, right 2008), on chronic plavix  . Rheumatoid arthritis ([place])   . S/P BKA (below knee amputation) unilateral [facility])    Oct 2014 L - failed limb preserving treatment. 2/2 tobacco use, DM, and cont weight bearing on surgical wound and developed gangrene   . Tobacco abuse   . Type II diabetes mellitus  with peripheral circulatory disorders, uncontrolled DX: 1993   Insulin dep. Poor control. Complicated by diabetic foot ulcer and diabetic eye disease.    Marland Kitchen Ulcer of foot, chronic (HCC)    Left. No OM per MRI (01/2013)   Review of Systems:  ROS: Pulmonary: pt denies increased work of breathing, shortness of breath,  Cardiac: pt denies palpitations, chest pain,  MSK: pain with abduction of the right shoulder past 90 degrees.  Normal strength and sensation of RUE    Physical Exam:  Vitals:   12/19/19 1006  BP: (!) 111/96  Pulse: 67  Temp: 97.9 F (36.6 C)  TempSrc: Oral  SpO2: 97%  Weight: 256 lb 1.6 oz (116.2 kg)  Height: 5\' 7"  (1.702 m)  Cardiac: normal rate and  rhythm, clear s1 and s2, no murmurs, rubs or gallops, Pulmonary: CTAB, not in distress Psych: Alert, conversant, in good spirits   Social History   Socioeconomic History  . Marital status: Divorced    Spouse name: Not on file  . Number of children: 2  . Years of education: college  . Highest education level: Not on file  Occupational History  . Occupation: Disability    Comment: previously worked as a Engineer, materials  . Smoking status: Current Every Day Smoker    Packs/day: 1.00    Years: 50.00    Pack years: 50.00    Types: Cigarettes  . Smokeless tobacco: Never Used  . Tobacco comment: 1 PPD  Substance and Sexual Activity  . Alcohol use: No    Alcohol/week: 0.0 standard drinks  . Drug use: No    Types: Marijuana, "Crack" cocaine    Comment: 09/28/2013 "no marijuana since 2010/04/02, no crack/cocaine 1989"  . Sexual activity: Not Currently  Other Topics Concern  . Not on file  Social History Narrative   On disability. Lives with son in [place]. Formerly worked as Training and development officer.    Boyfriend passed away stage 4 cancer 04/02/13.   S/p L BKA 04/02/2013. In wheelchair in paritially suitable apartment.    Social Determinants of Health   Financial Resource Strain:   . Difficulty of Paying Living Expenses: Not on file  Food Insecurity:   . Worried About Charity fundraiser in the Last Year: Not on file  . Ran Out of Food in the Last Year: Not on file  Transportation Needs:   . Lack of Transportation (Medical): Not on file  . Lack of Transportation (Non-Medical): Not on file  Physical Activity:   . Days of Exercise per Week: Not on file  . Minutes of Exercise per Session: Not on file  Stress:   . Feeling of Stress : Not on file  Social Connections:   . Frequency of Communication with Friends and Family: Not on file  . Frequency of Social Gatherings with Friends and Family: Not on file  . Attends Religious Services: Not on file  . Active Member of Clubs or Organizations: Not on file   . Attends Archivist Meetings: Not on file  . Marital Status: Not on file  Intimate Partner Violence:   . Fear of Current or Ex-Partner: Not on file  . Emotionally Abused: Not on file  . Physically Abused: Not on file  . Sexually Abused: Not on file    Family History  Problem Relation Age of Onset  . Diverticulosis Mother   . Diabetes Mother   . Hypertension Mother   . Congestive Heart Failure Mother   .  Asthma Father   . CAD Sister 60       MI at age 60 per patient.  However, she has not had a stent or CABG.   . Heart disease Sister        before age 52  . Breast cancer Neg Hx     Assessment & Plan:   See Encounters Tab for problem based charting.  Patient discussed with Dr. Evette Doffing

## 2019-12-19 NOTE — Assessment & Plan Note (Signed)
Today we will address HFU post right shoulder arthroplasty procedure.  She is at home currently after being in rehab for 3 weeks.  Shoulder is sore but is overall better, she expected some pain post op.  She is still limited in her use of the shoulder. This was similar to her course with her left shoulder and she expects continued improvement with time.  She is working with PT and OT and has increased her range of motion.    -encouraged her to continue working with PT and OT -she has a follow up with ortho early next month, she was unsure of the date/time made sure to put this on her d/c paperwork

## 2019-12-21 DIAGNOSIS — I5032 Chronic diastolic (congestive) heart failure: Secondary | ICD-10-CM | POA: Diagnosis not present

## 2019-12-21 DIAGNOSIS — M069 Rheumatoid arthritis, unspecified: Secondary | ICD-10-CM | POA: Diagnosis not present

## 2019-12-21 DIAGNOSIS — E1122 Type 2 diabetes mellitus with diabetic chronic kidney disease: Secondary | ICD-10-CM | POA: Diagnosis not present

## 2019-12-21 DIAGNOSIS — N183 Chronic kidney disease, stage 3 unspecified: Secondary | ICD-10-CM | POA: Diagnosis not present

## 2019-12-21 DIAGNOSIS — J302 Other seasonal allergic rhinitis: Secondary | ICD-10-CM | POA: Diagnosis not present

## 2019-12-21 DIAGNOSIS — E785 Hyperlipidemia, unspecified: Secondary | ICD-10-CM | POA: Diagnosis not present

## 2019-12-21 DIAGNOSIS — G4733 Obstructive sleep apnea (adult) (pediatric): Secondary | ICD-10-CM | POA: Diagnosis not present

## 2019-12-21 DIAGNOSIS — M5126 Other intervertebral disc displacement, lumbar region: Secondary | ICD-10-CM | POA: Diagnosis not present

## 2019-12-21 DIAGNOSIS — J440 Chronic obstructive pulmonary disease with acute lower respiratory infection: Secondary | ICD-10-CM | POA: Diagnosis not present

## 2019-12-21 DIAGNOSIS — J441 Chronic obstructive pulmonary disease with (acute) exacerbation: Secondary | ICD-10-CM | POA: Diagnosis not present

## 2019-12-21 DIAGNOSIS — K219 Gastro-esophageal reflux disease without esophagitis: Secondary | ICD-10-CM | POA: Diagnosis not present

## 2019-12-21 DIAGNOSIS — J181 Lobar pneumonia, unspecified organism: Secondary | ICD-10-CM | POA: Diagnosis not present

## 2019-12-21 DIAGNOSIS — M797 Fibromyalgia: Secondary | ICD-10-CM | POA: Diagnosis not present

## 2019-12-21 DIAGNOSIS — E1136 Type 2 diabetes mellitus with diabetic cataract: Secondary | ICD-10-CM | POA: Diagnosis not present

## 2019-12-21 DIAGNOSIS — M47816 Spondylosis without myelopathy or radiculopathy, lumbar region: Secondary | ICD-10-CM | POA: Diagnosis not present

## 2019-12-21 DIAGNOSIS — H409 Unspecified glaucoma: Secondary | ICD-10-CM | POA: Diagnosis not present

## 2019-12-21 DIAGNOSIS — E1142 Type 2 diabetes mellitus with diabetic polyneuropathy: Secondary | ICD-10-CM | POA: Diagnosis not present

## 2019-12-21 DIAGNOSIS — K760 Fatty (change of) liver, not elsewhere classified: Secondary | ICD-10-CM | POA: Diagnosis not present

## 2019-12-21 DIAGNOSIS — I130 Hypertensive heart and chronic kidney disease with heart failure and stage 1 through stage 4 chronic kidney disease, or unspecified chronic kidney disease: Secondary | ICD-10-CM | POA: Diagnosis not present

## 2019-12-21 DIAGNOSIS — E1151 Type 2 diabetes mellitus with diabetic peripheral angiopathy without gangrene: Secondary | ICD-10-CM | POA: Diagnosis not present

## 2019-12-21 DIAGNOSIS — J9811 Atelectasis: Secondary | ICD-10-CM | POA: Diagnosis not present

## 2019-12-21 DIAGNOSIS — D72829 Elevated white blood cell count, unspecified: Secondary | ICD-10-CM | POA: Diagnosis not present

## 2019-12-21 DIAGNOSIS — G894 Chronic pain syndrome: Secondary | ICD-10-CM | POA: Diagnosis not present

## 2019-12-21 DIAGNOSIS — J9601 Acute respiratory failure with hypoxia: Secondary | ICD-10-CM | POA: Diagnosis not present

## 2019-12-22 DIAGNOSIS — G4733 Obstructive sleep apnea (adult) (pediatric): Secondary | ICD-10-CM | POA: Diagnosis not present

## 2019-12-22 DIAGNOSIS — E1136 Type 2 diabetes mellitus with diabetic cataract: Secondary | ICD-10-CM | POA: Diagnosis not present

## 2019-12-22 DIAGNOSIS — J181 Lobar pneumonia, unspecified organism: Secondary | ICD-10-CM | POA: Diagnosis not present

## 2019-12-22 DIAGNOSIS — E1142 Type 2 diabetes mellitus with diabetic polyneuropathy: Secondary | ICD-10-CM | POA: Diagnosis not present

## 2019-12-22 DIAGNOSIS — J441 Chronic obstructive pulmonary disease with (acute) exacerbation: Secondary | ICD-10-CM | POA: Diagnosis not present

## 2019-12-22 DIAGNOSIS — J9811 Atelectasis: Secondary | ICD-10-CM | POA: Diagnosis not present

## 2019-12-22 DIAGNOSIS — N183 Chronic kidney disease, stage 3 unspecified: Secondary | ICD-10-CM | POA: Diagnosis not present

## 2019-12-22 DIAGNOSIS — J302 Other seasonal allergic rhinitis: Secondary | ICD-10-CM | POA: Diagnosis not present

## 2019-12-22 DIAGNOSIS — I5032 Chronic diastolic (congestive) heart failure: Secondary | ICD-10-CM | POA: Diagnosis not present

## 2019-12-22 DIAGNOSIS — J440 Chronic obstructive pulmonary disease with acute lower respiratory infection: Secondary | ICD-10-CM | POA: Diagnosis not present

## 2019-12-22 DIAGNOSIS — E1122 Type 2 diabetes mellitus with diabetic chronic kidney disease: Secondary | ICD-10-CM | POA: Diagnosis not present

## 2019-12-22 DIAGNOSIS — E1151 Type 2 diabetes mellitus with diabetic peripheral angiopathy without gangrene: Secondary | ICD-10-CM | POA: Diagnosis not present

## 2019-12-22 DIAGNOSIS — M47816 Spondylosis without myelopathy or radiculopathy, lumbar region: Secondary | ICD-10-CM | POA: Diagnosis not present

## 2019-12-22 DIAGNOSIS — I130 Hypertensive heart and chronic kidney disease with heart failure and stage 1 through stage 4 chronic kidney disease, or unspecified chronic kidney disease: Secondary | ICD-10-CM | POA: Diagnosis not present

## 2019-12-22 DIAGNOSIS — E785 Hyperlipidemia, unspecified: Secondary | ICD-10-CM | POA: Diagnosis not present

## 2019-12-22 DIAGNOSIS — M5126 Other intervertebral disc displacement, lumbar region: Secondary | ICD-10-CM | POA: Diagnosis not present

## 2019-12-22 DIAGNOSIS — D72829 Elevated white blood cell count, unspecified: Secondary | ICD-10-CM | POA: Diagnosis not present

## 2019-12-22 DIAGNOSIS — K219 Gastro-esophageal reflux disease without esophagitis: Secondary | ICD-10-CM | POA: Diagnosis not present

## 2019-12-22 DIAGNOSIS — J9601 Acute respiratory failure with hypoxia: Secondary | ICD-10-CM | POA: Diagnosis not present

## 2019-12-22 DIAGNOSIS — G894 Chronic pain syndrome: Secondary | ICD-10-CM | POA: Diagnosis not present

## 2019-12-22 DIAGNOSIS — M069 Rheumatoid arthritis, unspecified: Secondary | ICD-10-CM | POA: Diagnosis not present

## 2019-12-22 DIAGNOSIS — K760 Fatty (change of) liver, not elsewhere classified: Secondary | ICD-10-CM | POA: Diagnosis not present

## 2019-12-22 DIAGNOSIS — H409 Unspecified glaucoma: Secondary | ICD-10-CM | POA: Diagnosis not present

## 2019-12-22 DIAGNOSIS — M797 Fibromyalgia: Secondary | ICD-10-CM | POA: Diagnosis not present

## 2019-12-24 DIAGNOSIS — G4733 Obstructive sleep apnea (adult) (pediatric): Secondary | ICD-10-CM | POA: Diagnosis not present

## 2019-12-24 NOTE — Procedures (Signed)
Patient Name: Jennifer [name], Jennifer [name] Date: 12/14/2019 Gender: Female D.O.B: 10/29/1961 Age (years): 58 Referring Provider: Bartholomew Crews Height (inches): 51 Interpreting Physician: Baird Lyons MD, ABSM Weight (lbs): 253 RPSGT: Laren Everts BMI: 41 MRN: [id number] Neck Size: 15.50  CLINICAL INFORMATION Sleep Study Type: NPSG Indication for sleep study: COPD, Daytime Fatigue, Diabetes, Fatigue, Hypertension, Morbid Obesity, Parasomnias, Re-Evaluation, Witnesses Apnea / Gasping During Sleep Epworth Sleepiness Score: 15  Most recent polysomnogram dated 06/09/2017 revealed an AHI of 19.5/h and RDI of 19.9/h. Most recent titration study dated 06/09/2017 was optimal at 12cm H2O with an AHI of 8.4/h. SLEEP STUDY TECHNIQUE As per the AASM Manual for the Scoring of Sleep and Associated Events v2.3 (April 2016) with a hypopnea requiring 4% desaturations.  The channels recorded and monitored were frontal, central and occipital EEG, electrooculogram (EOG), submentalis EMG (chin), nasal and oral airflow, thoracic and abdominal wall motion, anterior tibialis EMG, snore microphone, electrocardiogram, and pulse oximetry.  MEDICATIONS Medications self-administered by patient taken the night of the study : [place] The study was initiated at 10:52:36 PM and ended at 5:46:16 AM.  Sleep onset time was 0.0 minutes and the sleep efficiency was 88.4%%. The total sleep time was 365.5 minutes.  Stage REM latency was 159.0 minutes.  The patient spent 5.3%% of the night in stage N1 sleep, 80.6%% in stage N2 sleep, 0.0%% in stage N3 and 14.1% in REM.  Alpha intrusion was absent.  Supine sleep was 12.26%.  RESPIRATORY PARAMETERS The overall apnea/hypopnea index (AHI) was 2.3 per hour. There were 0 total apneas, including 0 obstructive, 0 central and 0 mixed apneas. There were 14 hypopneas and 2 RERAs.  The AHI during Stage REM sleep was 11.7 per hour.  AHI while supine was  1.3 per hour.  The mean oxygen saturation was 89.6%. The minimum SpO2 during sleep was 76.0%.  moderate snoring was noted during this study.  CARDIAC DATA The 2 lead EKG demonstrated sinus rhythm. The mean heart rate was 69.4 beats per minute. Other EKG findings include: None.  LEG MOVEMENT DATA The total PLMS were 0 with a resulting PLMS index of 0.0. Associated arousal with leg movement index was 0.0 .  IMPRESSIONS - No significant obstructive sleep apnea occurred during this study (AHI = 2.3/h). - No significant central sleep apnea occurred during this study (CAI = 0.0/h). - Moderate oxygen desaturation was noted during this study (Min O2 = 76.0%).  - Supplemental O2 1L/ min was added at 3:27 AM per protocol for sustained low saturation with subsequent mean saturation around 91%. - Total time with saturation 88% or less was 169.9 minutes, indicating Nocturnal Hypoxemia.. - The patient snored with moderate snoring volume. - No cardiac abnormalities were noted during this study. - Clinically significant periodic limb movements did not occur during sleep. No significant associated arousals. - Note 19 lb weight loss recorded since last prior study in 2018. - Note Only Humalog was reported as medication taken at this study. Sleep architecture shows over 80% of recording time in Stage N2 sleep with little initial arousal or awakening, suggesting sedating medication effect.  DIAGNOSIS - Nocturnal Hypoxemia (327.26 [G47.36 ICD-10])  RECOMMENDATIONS - Consider need for O2 supplement during sleep. - Review sedating medication use with patient if appropriate. - Sleep hygiene should be reviewed to assess factors that may improve sleep quality. - Weight management and regular exercise should be initiated or continued if appropriate.  [Electronically signed] 12/24/2019 11:19 AM  Baird Lyons MD, ABSM  Diplomate, American Board of Sleep Medicine   NPI: [id number]                            [place], [place] [facility]  ELECTRONICALLY SIGNED ON:  12/24/2019, 11:39 AM [place] PH: [phone]   FX: [phone] [place]

## 2019-12-25 ENCOUNTER — Other Ambulatory Visit: Payer: Self-pay | Admitting: Internal Medicine

## 2019-12-26 ENCOUNTER — Telehealth: Payer: Self-pay | Admitting: *Deleted

## 2019-12-26 ENCOUNTER — Other Ambulatory Visit: Payer: Self-pay | Admitting: Internal Medicine

## 2019-12-26 DIAGNOSIS — G4734 Idiopathic sleep related nonobstructive alveolar hypoventilation: Secondary | ICD-10-CM

## 2019-12-26 DIAGNOSIS — J9611 Chronic respiratory failure with hypoxia: Secondary | ICD-10-CM

## 2019-12-26 DIAGNOSIS — J449 Chronic obstructive pulmonary disease, unspecified: Secondary | ICD-10-CM

## 2019-12-26 NOTE — Telephone Encounter (Signed)
Jolee Ewing at [employer] notified vis community message that an order for home oxygen has been placed based on sleep study results from 12/14/2019. [name], BSN, RN-BC

## 2019-12-26 NOTE — Progress Notes (Signed)
Order for O2 I will call her later in week to discuss

## 2019-12-27 ENCOUNTER — Encounter: Payer: Self-pay | Admitting: Family

## 2019-12-27 ENCOUNTER — Ambulatory Visit (INDEPENDENT_AMBULATORY_CARE_PROVIDER_SITE_OTHER): Payer: Medicare Other | Admitting: Family

## 2019-12-27 ENCOUNTER — Other Ambulatory Visit: Payer: Self-pay

## 2019-12-27 DIAGNOSIS — M75121 Complete rotator cuff tear or rupture of right shoulder, not specified as traumatic: Secondary | ICD-10-CM

## 2019-12-27 NOTE — Telephone Encounter (Signed)
Catron, Germain Osgood, Orvis Brill, RN; [place], Stanford Breed, [place]; [place], [place], [place]; [place], [place] F; 1 other   got it.  Thank you

## 2019-12-28 DIAGNOSIS — G4733 Obstructive sleep apnea (adult) (pediatric): Secondary | ICD-10-CM | POA: Diagnosis not present

## 2019-12-28 DIAGNOSIS — M069 Rheumatoid arthritis, unspecified: Secondary | ICD-10-CM | POA: Diagnosis not present

## 2019-12-28 DIAGNOSIS — K219 Gastro-esophageal reflux disease without esophagitis: Secondary | ICD-10-CM | POA: Diagnosis not present

## 2019-12-28 DIAGNOSIS — I2510 Atherosclerotic heart disease of native coronary artery without angina pectoris: Secondary | ICD-10-CM | POA: Diagnosis not present

## 2019-12-28 DIAGNOSIS — N183 Chronic kidney disease, stage 3 unspecified: Secondary | ICD-10-CM | POA: Diagnosis not present

## 2019-12-28 DIAGNOSIS — M47816 Spondylosis without myelopathy or radiculopathy, lumbar region: Secondary | ICD-10-CM | POA: Diagnosis not present

## 2019-12-28 DIAGNOSIS — G4700 Insomnia, unspecified: Secondary | ICD-10-CM | POA: Diagnosis not present

## 2019-12-28 DIAGNOSIS — H409 Unspecified glaucoma: Secondary | ICD-10-CM | POA: Diagnosis not present

## 2019-12-28 DIAGNOSIS — J9811 Atelectasis: Secondary | ICD-10-CM | POA: Diagnosis not present

## 2019-12-28 DIAGNOSIS — E785 Hyperlipidemia, unspecified: Secondary | ICD-10-CM | POA: Diagnosis not present

## 2019-12-28 DIAGNOSIS — J302 Other seasonal allergic rhinitis: Secondary | ICD-10-CM | POA: Diagnosis not present

## 2019-12-28 DIAGNOSIS — M5126 Other intervertebral disc displacement, lumbar region: Secondary | ICD-10-CM | POA: Diagnosis not present

## 2019-12-28 DIAGNOSIS — E1151 Type 2 diabetes mellitus with diabetic peripheral angiopathy without gangrene: Secondary | ICD-10-CM | POA: Diagnosis not present

## 2019-12-28 DIAGNOSIS — M797 Fibromyalgia: Secondary | ICD-10-CM | POA: Diagnosis not present

## 2019-12-28 DIAGNOSIS — E1142 Type 2 diabetes mellitus with diabetic polyneuropathy: Secondary | ICD-10-CM | POA: Diagnosis not present

## 2019-12-28 DIAGNOSIS — I130 Hypertensive heart and chronic kidney disease with heart failure and stage 1 through stage 4 chronic kidney disease, or unspecified chronic kidney disease: Secondary | ICD-10-CM | POA: Diagnosis not present

## 2019-12-28 DIAGNOSIS — G894 Chronic pain syndrome: Secondary | ICD-10-CM | POA: Diagnosis not present

## 2019-12-28 DIAGNOSIS — E1136 Type 2 diabetes mellitus with diabetic cataract: Secondary | ICD-10-CM | POA: Diagnosis not present

## 2019-12-28 DIAGNOSIS — E1122 Type 2 diabetes mellitus with diabetic chronic kidney disease: Secondary | ICD-10-CM | POA: Diagnosis not present

## 2019-12-28 DIAGNOSIS — I700 Atherosclerosis of aorta: Secondary | ICD-10-CM | POA: Diagnosis not present

## 2019-12-28 DIAGNOSIS — J449 Chronic obstructive pulmonary disease, unspecified: Secondary | ICD-10-CM | POA: Diagnosis not present

## 2019-12-28 DIAGNOSIS — Z4789 Encounter for other orthopedic aftercare: Secondary | ICD-10-CM | POA: Diagnosis not present

## 2019-12-28 DIAGNOSIS — I5032 Chronic diastolic (congestive) heart failure: Secondary | ICD-10-CM | POA: Diagnosis not present

## 2019-12-28 DIAGNOSIS — J9601 Acute respiratory failure with hypoxia: Secondary | ICD-10-CM | POA: Diagnosis not present

## 2019-12-28 NOTE — Progress Notes (Signed)
Post-Op Visit Note   Patient: Jennifer [name]           Date of Birth: Dec 02, 1961           MRN: [id number] Visit Date: 12/27/2019 PCP: Bartholomew Crews, MD  Chief Complaint:  Chief Complaint  Patient presents with  . Right Shoulder - Follow-up, Routine Post Op    HPI:  HPI The patient is a 59 year old woman seen today status post right shoulder arthroscopy with debridement on 11/11/19 for a complete tear of the right rotator cuff she is residing at skilled nursing.   States is doing ok. Still having difficulty getting above head due to pain. Does have increased rom and strength.  Ortho Exam Portals are well healed. no surrounding erythema no sign of infection she will abduct shoulder 90 degrees.  Visit Diagnoses:  No diagnosis found.  Plan: Discussed the importance of range of motion of the shoulder. Weightbearing of the right upper extremity as tolerated therapy to progress with range of motion as their discretion. Shed like to follow up once more with dr duda.  Follow-Up Instructions: Return in about 4 weeks (around 01/24/2020) for c duda.   Imaging: No results found.  Orders:  No orders of the defined types were placed in this encounter.  No orders of the defined types were placed in this encounter.    PMFS History: Patient Active Problem List   Diagnosis Date Noted  . Encounter for power mobility device assessment 11/24/2019  . Pain in joint of right shoulder 11/11/2019  . Nontraumatic incomplete tear of right rotator cuff   . CKD (chronic kidney disease) stage 3, GFR 30-59 ml/min 10/18/2019  . 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 Skin rash 06/30/2019  . Impingement syndrome of right shoulder 06/22/2019  . Urinary incontinence 05/13/2018  . Nocturnal hypoxia 06/12/2017  . Morbid obesity with BMI of 40.0-44.9, adult ([place]) 03/26/2017  . Toe amputation status, right 01/16/2017  . Thrombocytopenia ([place]) 06/05/2016  . 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 Type 2 diabetes with  nephropathy ([place]) 09/07/2015  . Uncontrolled diabetes mellitus with retinopathy, due to underlying condition, without macular edema ([place]) 09/05/2015  . Diabetic retinopathy ([place]) 09/05/2015  . Counseling regarding end of life decision making 06/14/2015  . Atherosclerosis of aorta ([place]) 04/04/2015  . Anemia 10/05/2014  . Chronic diastolic heart failure ([place])   . Hx of BKA, left ([place])   . Tobacco abuse   . Severe obesity (BMI >= 40) ([place]) 03/02/2013  . Abnormality of gait 03/01/2013  . Healthcare maintenance 07/10/2012  . Chronic prescription opiate use 12/03/2011  . Secondary diabetes mellitus with peripheral vascular disease ([place]) 08/27/2011  . 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 Hypertension associated with diabetes ([place]) 11/29/2009  . Chronic insomnia 10/25/2009  . GASTROESOPHAGEAL REFLUX DISEASE 11/24/2008  . Depression, major, severe recurrence ([place]) 04/06/2008  . DM (diabetes mellitus) type II uncontrolled, periph vascular disorder ([place]) 04/02/2007  . Hyperlipidemia associated with type 2 diabetes mellitus ([place]) 01/08/2007  . COPD exacerbation ([place]) 01/08/2007   Past Medical History:  Diagnosis Date  . Anginal pain [facility])    '3' of 10 ischemia ruled out 9/9   . Arthritis of lumbar spine   . Asthma   . Cataract   . CHF (congestive heart failure) ([place])   . 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 Chronic bronchitis ([place])    "I get it alot" (09/28/2013)  . Chronic diastolic heart failure (  [place])    grade 2 per 2D echocardiogram (01/2013)  . Chronic lower back pain   . Chronic osteomyelitis of foot (HCC)    chronic, right secondary to diabetic foot ulcers  . Chronic pain syndrome 12/03/2011   Likely secondary to depression, "fibromyalgia", neuropathy, and obesity. Lumbar MRI 2014 no sig change from prior (2008) : Stable hypertrophic facet disease most notable at L4-5. Stable shallow left foraminal/extraforaminal disc protrusion at L4-5. No  direct neural compression.      Marland Kitchen COPD 01/08/2007   PFT's 05/2007 : FEV1/FVC 82, FEV1 64% pred, FEF 25-75% 40% predicted, 16% improvement in FEV1 with bronchodilators.     . Depression   . Diabetes mellitus without complication ([place])    Type II  . Diabetic peripheral neuropathy ([place])   . DVT of upper extremity (deep vein thrombosis) ([place]) 03/11/2013   Secondary to PICC line. Right brachial vein, diagnosed on 03/10/2013 Coumadin for 3 months. End date 06/10/2013   . Dyspnea    "smoker"  . Environmental allergies    Hx: of  . Exertional shortness of breath   . Fatty liver 2003   observed on ultrasound abdomen  . Fibromyalgia   . GERD (gastroesophageal reflux disease)   . Glaucoma   . History of use of hearing aid   . Hyperlipidemia   . Hyperplastic colon polyp 12/2010   Per colonoscopy (12/2010) - Dr. Deatra Ina  . Hypertension   . 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 Blood cultures 01/26/13 S. Aureus and GBS. Blood cultures Feb 6th, 8th, and 9th and March were negative.Repeat TEE 3/20 negative for vegitations  . 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 2/2 tobacco use, DM, and cont weight bearing on surgical wound and developed gangrene   . Pneumonia   . Polymicrobial bacterial infection 01/2013   GBS and S. aureus bacteremia // Source likely infected diabetic foot ulcer  . PVD (peripheral vascular disease) with claudication ([place])    Stents to bilateral common iliac arteries (left 2005, right 2008), on chronic plavix  . Rheumatoid arthritis ([place])   . S/P BKA (below knee amputation) unilateral [facility])    Oct 2014 L - failed limb preserving treatment. 2/2 tobacco use, DM, and cont weight bearing on surgical wound and developed gangrene   . Tobacco abuse   . Type II diabetes mellitus with peripheral circulatory disorders, uncontrolled DX: 1993   Insulin dep. Poor  control. Complicated by diabetic foot ulcer and diabetic eye disease.    Marland Kitchen Ulcer of foot, chronic (HCC)    Left. No OM per MRI (01/2013)    Family History  Problem Relation Age of Onset  . Diverticulosis Mother   . Diabetes Mother   . Hypertension Mother   . Congestive Heart Failure Mother   . Asthma Father   . CAD Sister 47       MI at age 90 per patient.  However, she has not had a stent or CABG.   . Heart disease Sister        before age 37  . Breast cancer Neg Hx     Past Surgical History:  Procedure Laterality Date  . ABDOMINAL HYSTERECTOMY  1997   secondary to uterine fibroids  . AMPUTATION Left 08/31/2013   Procedure: AMPUTATION RAY;  Surgeon: Newt Minion, MD;  Location: [place];  Service: Orthopedics;  Laterality: Left;  Left Foot 5th Ray Amputation  . AMPUTATION Left 09/28/2013   Procedure: Left Midfoot amputation;  Surgeon: Newt Minion, MD;  Location: [place];  Service: Orthopedics;  Laterality: Left;  Left Midfoot amputation  . AMPUTATION Left 10/14/2013   Procedure: AMPUTATION BELOW KNEE- left;  Surgeon: Newt Minion, MD;  Location: [place];  Service: Orthopedics;  Laterality: Left;  Left Below Knee Amputation   . AMPUTATION TOE Right 01/15/2017   Procedure: AMPUTATION 5th TOE RIGHT FOOT;  Surgeon: Edrick Kins, DPM;  Location: [facility];  Service: Podiatry;  Laterality: Right;  . APPLICATION OF WOUND VAC  04/01/2019   Procedure: Application Of Wound Vac;  Surgeon: Newt Minion, MD;  Location: [facility];  Service: Orthopedics;;  . BLADDER SURGERY     bladder reconstruction surgery  . BREAST BIOPSY     multiple-benign per pt  . COLONOSCOPY    . ESOPHAGOGASTRODUODENOSCOPY N/A 09/20/2013   Procedure: ESOPHAGOGASTRODUODENOSCOPY (EGD);  Surgeon: Jerene Bears, MD;  Location: [facility];  Service: Gastroenterology;  Laterality: N/A;  . FOOT AMPUTATION THROUGH METATARSAL Left 09/28/2013  . GANGLION CYST EXCISION     multiple  . PERIPHERAL VASCULAR INTERVENTION     stents in lower  ext  . SHOULDER ARTHROSCOPY Right 11/11/2019   RIGHT SHOULDER ARTHROSCOPY AND DEBRIDEMENT   . SHOULDER ARTHROSCOPY Right 11/11/2019   Procedure: RIGHT SHOULDER ARTHROSCOPY AND DEBRIDEMENT;  Surgeon: Newt Minion, MD;  Location: [place];  Service: Orthopedics;  Laterality: Right;  . SHOULDER ARTHROSCOPY W/ ROTATOR CUFF REPAIR Bilateral   . SKIN SPLIT GRAFT Bilateral 05/13/2013   Procedure: Right and Left Foot Allograft Skin Graft;  Surgeon: Newt Minion, MD;  Location: [place];  Service: Orthopedics;  Laterality: Bilateral;  Right and Left Foot Allograft Skin Graft  . STUMP REVISION Left 04/01/2019   Procedure: REVISION LEFT BELOW KNEE AMPUTATION;  Surgeon: Newt Minion, MD;  Location: [facility];  Service: Orthopedics;  Laterality: Left;  . TEE WITHOUT CARDIOVERSION N/A 01/31/2013   Procedure: TRANSESOPHAGEAL ECHOCARDIOGRAM (TEE);  Surgeon: Fay Records, MD;  Location: [facility];  Service: Cardiovascular;  Laterality: N/A;  Rm [phone]  . TEE WITHOUT CARDIOVERSION N/A 03/10/2013   Procedure: TRANSESOPHAGEAL ECHOCARDIOGRAM (TEE);  Surgeon: Larey Dresser, MD;  Location: [facility];  Service: Cardiovascular;  Laterality: N/A;  Rm. [phone]  . TOE AMPUTATION Left 08/31/2013   4TH & 5 TH TOE   . TONSILLECTOMY    . TUBAL LIGATION    . WRIST SURGERY Right    "for tumors" (09/28/2013)   Social History   Occupational History  . Occupation: Disability    Comment: previously worked as a Engineer, materials  . Smoking status: Current Every Day Smoker    Packs/day: 1.00    Years: 50.00    Pack years: 50.00    Types: Cigarettes  . Smokeless tobacco: Never Used  . Tobacco comment: 1 PPD  Substance and Sexual Activity  . Alcohol use: No    Alcohol/week: 0.0 standard drinks  . Drug use: No    Types: Marijuana, "Crack" cocaine    Comment: 09/28/2013 "no marijuana since 2011, no crack/cocaine 1989"  . Sexual activity: Not Currently

## 2019-12-29 ENCOUNTER — Telehealth: Payer: Self-pay | Admitting: Internal Medicine

## 2019-12-29 DIAGNOSIS — M5126 Other intervertebral disc displacement, lumbar region: Secondary | ICD-10-CM | POA: Diagnosis not present

## 2019-12-29 DIAGNOSIS — E785 Hyperlipidemia, unspecified: Secondary | ICD-10-CM | POA: Diagnosis not present

## 2019-12-29 DIAGNOSIS — N183 Chronic kidney disease, stage 3 unspecified: Secondary | ICD-10-CM | POA: Diagnosis not present

## 2019-12-29 DIAGNOSIS — E1142 Type 2 diabetes mellitus with diabetic polyneuropathy: Secondary | ICD-10-CM | POA: Diagnosis not present

## 2019-12-29 DIAGNOSIS — M47816 Spondylosis without myelopathy or radiculopathy, lumbar region: Secondary | ICD-10-CM | POA: Diagnosis not present

## 2019-12-29 DIAGNOSIS — E1136 Type 2 diabetes mellitus with diabetic cataract: Secondary | ICD-10-CM | POA: Diagnosis not present

## 2019-12-29 DIAGNOSIS — G4733 Obstructive sleep apnea (adult) (pediatric): Secondary | ICD-10-CM | POA: Diagnosis not present

## 2019-12-29 DIAGNOSIS — M797 Fibromyalgia: Secondary | ICD-10-CM | POA: Diagnosis not present

## 2019-12-29 DIAGNOSIS — E1122 Type 2 diabetes mellitus with diabetic chronic kidney disease: Secondary | ICD-10-CM | POA: Diagnosis not present

## 2019-12-29 DIAGNOSIS — M069 Rheumatoid arthritis, unspecified: Secondary | ICD-10-CM | POA: Diagnosis not present

## 2019-12-29 DIAGNOSIS — I2510 Atherosclerotic heart disease of native coronary artery without angina pectoris: Secondary | ICD-10-CM | POA: Diagnosis not present

## 2019-12-29 DIAGNOSIS — Z4789 Encounter for other orthopedic aftercare: Secondary | ICD-10-CM | POA: Diagnosis not present

## 2019-12-29 DIAGNOSIS — J302 Other seasonal allergic rhinitis: Secondary | ICD-10-CM | POA: Diagnosis not present

## 2019-12-29 DIAGNOSIS — J9601 Acute respiratory failure with hypoxia: Secondary | ICD-10-CM | POA: Diagnosis not present

## 2019-12-29 DIAGNOSIS — E1151 Type 2 diabetes mellitus with diabetic peripheral angiopathy without gangrene: Secondary | ICD-10-CM | POA: Diagnosis not present

## 2019-12-29 DIAGNOSIS — I700 Atherosclerosis of aorta: Secondary | ICD-10-CM | POA: Diagnosis not present

## 2019-12-29 DIAGNOSIS — H409 Unspecified glaucoma: Secondary | ICD-10-CM | POA: Diagnosis not present

## 2019-12-29 DIAGNOSIS — K219 Gastro-esophageal reflux disease without esophagitis: Secondary | ICD-10-CM | POA: Diagnosis not present

## 2019-12-29 DIAGNOSIS — G4700 Insomnia, unspecified: Secondary | ICD-10-CM | POA: Diagnosis not present

## 2019-12-29 DIAGNOSIS — G894 Chronic pain syndrome: Secondary | ICD-10-CM | POA: Diagnosis not present

## 2019-12-29 DIAGNOSIS — J449 Chronic obstructive pulmonary disease, unspecified: Secondary | ICD-10-CM | POA: Diagnosis not present

## 2019-12-29 DIAGNOSIS — I5032 Chronic diastolic (congestive) heart failure: Secondary | ICD-10-CM | POA: Diagnosis not present

## 2019-12-29 DIAGNOSIS — I130 Hypertensive heart and chronic kidney disease with heart failure and stage 1 through stage 4 chronic kidney disease, or unspecified chronic kidney disease: Secondary | ICD-10-CM | POA: Diagnosis not present

## 2019-12-29 DIAGNOSIS — J9811 Atelectasis: Secondary | ICD-10-CM | POA: Diagnosis not present

## 2019-12-29 NOTE — Telephone Encounter (Signed)
I called her to discuss the hypoxia that was seen on her sleep study.  She does not remember having oxygen placed on her during the sleep study.  She is willing to try the nocturnal oxygen.  She brought up that she is a smoker and lives with other smokers.  We discussed safety with oxygen and to keep open the flames away from the oxygen tank and oxygen tubing and did not use oxygen when she is smoking.

## 2019-12-30 ENCOUNTER — Other Ambulatory Visit: Payer: Self-pay | Admitting: Internal Medicine

## 2019-12-30 DIAGNOSIS — E1159 Type 2 diabetes mellitus with other circulatory complications: Secondary | ICD-10-CM

## 2019-12-30 DIAGNOSIS — J42 Unspecified chronic bronchitis: Secondary | ICD-10-CM

## 2020-01-02 DIAGNOSIS — I700 Atherosclerosis of aorta: Secondary | ICD-10-CM | POA: Diagnosis not present

## 2020-01-02 DIAGNOSIS — Z4789 Encounter for other orthopedic aftercare: Secondary | ICD-10-CM | POA: Diagnosis not present

## 2020-01-02 DIAGNOSIS — K219 Gastro-esophageal reflux disease without esophagitis: Secondary | ICD-10-CM | POA: Diagnosis not present

## 2020-01-02 DIAGNOSIS — J449 Chronic obstructive pulmonary disease, unspecified: Secondary | ICD-10-CM | POA: Diagnosis not present

## 2020-01-02 DIAGNOSIS — H409 Unspecified glaucoma: Secondary | ICD-10-CM | POA: Diagnosis not present

## 2020-01-02 DIAGNOSIS — J9811 Atelectasis: Secondary | ICD-10-CM | POA: Diagnosis not present

## 2020-01-02 DIAGNOSIS — N183 Chronic kidney disease, stage 3 unspecified: Secondary | ICD-10-CM | POA: Diagnosis not present

## 2020-01-02 DIAGNOSIS — I2510 Atherosclerotic heart disease of native coronary artery without angina pectoris: Secondary | ICD-10-CM | POA: Diagnosis not present

## 2020-01-02 DIAGNOSIS — E785 Hyperlipidemia, unspecified: Secondary | ICD-10-CM | POA: Diagnosis not present

## 2020-01-02 DIAGNOSIS — J302 Other seasonal allergic rhinitis: Secondary | ICD-10-CM | POA: Diagnosis not present

## 2020-01-02 DIAGNOSIS — G4733 Obstructive sleep apnea (adult) (pediatric): Secondary | ICD-10-CM | POA: Diagnosis not present

## 2020-01-02 DIAGNOSIS — E1122 Type 2 diabetes mellitus with diabetic chronic kidney disease: Secondary | ICD-10-CM | POA: Diagnosis not present

## 2020-01-02 DIAGNOSIS — G4700 Insomnia, unspecified: Secondary | ICD-10-CM | POA: Diagnosis not present

## 2020-01-02 DIAGNOSIS — G894 Chronic pain syndrome: Secondary | ICD-10-CM | POA: Diagnosis not present

## 2020-01-02 DIAGNOSIS — I5032 Chronic diastolic (congestive) heart failure: Secondary | ICD-10-CM | POA: Diagnosis not present

## 2020-01-02 DIAGNOSIS — M069 Rheumatoid arthritis, unspecified: Secondary | ICD-10-CM | POA: Diagnosis not present

## 2020-01-02 DIAGNOSIS — M5126 Other intervertebral disc displacement, lumbar region: Secondary | ICD-10-CM | POA: Diagnosis not present

## 2020-01-02 DIAGNOSIS — E1136 Type 2 diabetes mellitus with diabetic cataract: Secondary | ICD-10-CM | POA: Diagnosis not present

## 2020-01-02 DIAGNOSIS — M47816 Spondylosis without myelopathy or radiculopathy, lumbar region: Secondary | ICD-10-CM | POA: Diagnosis not present

## 2020-01-02 DIAGNOSIS — M797 Fibromyalgia: Secondary | ICD-10-CM | POA: Diagnosis not present

## 2020-01-02 DIAGNOSIS — I130 Hypertensive heart and chronic kidney disease with heart failure and stage 1 through stage 4 chronic kidney disease, or unspecified chronic kidney disease: Secondary | ICD-10-CM | POA: Diagnosis not present

## 2020-01-02 DIAGNOSIS — E1142 Type 2 diabetes mellitus with diabetic polyneuropathy: Secondary | ICD-10-CM | POA: Diagnosis not present

## 2020-01-02 DIAGNOSIS — J9601 Acute respiratory failure with hypoxia: Secondary | ICD-10-CM | POA: Diagnosis not present

## 2020-01-02 DIAGNOSIS — E1151 Type 2 diabetes mellitus with diabetic peripheral angiopathy without gangrene: Secondary | ICD-10-CM | POA: Diagnosis not present

## 2020-01-04 ENCOUNTER — Telehealth: Payer: Self-pay | Admitting: Internal Medicine

## 2020-01-04 NOTE — Telephone Encounter (Signed)
Community message sent to Jolee Ewing at [facility] to learn status of home oxygen. [name], BSN, RN-BC

## 2020-01-04 NOTE — Telephone Encounter (Signed)
Patient calling to report she has not heard anything from the [place] her orders were sent to. Pt states the Dr. Hulen Skains her last week in reference to the use of the Oxygen but has yet to receive it.  Please call the patient back.

## 2020-01-05 NOTE — Telephone Encounter (Signed)
[place], Germain Osgood, Orvis Brill, RN; [place], Stanford Breed, [place]; [place], [place], [place]; [place], [place] F  We have called and left several messages for her to call us to put a credit card on file in order for [place] to do the delivery. I have asked the processors to call her again to get this taken care of.

## 2020-01-06 DIAGNOSIS — E1151 Type 2 diabetes mellitus with diabetic peripheral angiopathy without gangrene: Secondary | ICD-10-CM | POA: Diagnosis not present

## 2020-01-06 DIAGNOSIS — K219 Gastro-esophageal reflux disease without esophagitis: Secondary | ICD-10-CM | POA: Diagnosis not present

## 2020-01-06 DIAGNOSIS — G4733 Obstructive sleep apnea (adult) (pediatric): Secondary | ICD-10-CM | POA: Diagnosis not present

## 2020-01-06 DIAGNOSIS — E1142 Type 2 diabetes mellitus with diabetic polyneuropathy: Secondary | ICD-10-CM | POA: Diagnosis not present

## 2020-01-06 DIAGNOSIS — N183 Chronic kidney disease, stage 3 unspecified: Secondary | ICD-10-CM | POA: Diagnosis not present

## 2020-01-06 DIAGNOSIS — H409 Unspecified glaucoma: Secondary | ICD-10-CM | POA: Diagnosis not present

## 2020-01-06 DIAGNOSIS — M069 Rheumatoid arthritis, unspecified: Secondary | ICD-10-CM | POA: Diagnosis not present

## 2020-01-06 DIAGNOSIS — J9601 Acute respiratory failure with hypoxia: Secondary | ICD-10-CM | POA: Diagnosis not present

## 2020-01-06 DIAGNOSIS — I700 Atherosclerosis of aorta: Secondary | ICD-10-CM | POA: Diagnosis not present

## 2020-01-06 DIAGNOSIS — J449 Chronic obstructive pulmonary disease, unspecified: Secondary | ICD-10-CM | POA: Diagnosis not present

## 2020-01-06 DIAGNOSIS — E785 Hyperlipidemia, unspecified: Secondary | ICD-10-CM | POA: Diagnosis not present

## 2020-01-06 DIAGNOSIS — G894 Chronic pain syndrome: Secondary | ICD-10-CM | POA: Diagnosis not present

## 2020-01-06 DIAGNOSIS — I2510 Atherosclerotic heart disease of native coronary artery without angina pectoris: Secondary | ICD-10-CM | POA: Diagnosis not present

## 2020-01-06 DIAGNOSIS — G4700 Insomnia, unspecified: Secondary | ICD-10-CM | POA: Diagnosis not present

## 2020-01-06 DIAGNOSIS — Z4789 Encounter for other orthopedic aftercare: Secondary | ICD-10-CM | POA: Diagnosis not present

## 2020-01-06 DIAGNOSIS — E1136 Type 2 diabetes mellitus with diabetic cataract: Secondary | ICD-10-CM | POA: Diagnosis not present

## 2020-01-06 DIAGNOSIS — J9811 Atelectasis: Secondary | ICD-10-CM | POA: Diagnosis not present

## 2020-01-06 DIAGNOSIS — M797 Fibromyalgia: Secondary | ICD-10-CM | POA: Diagnosis not present

## 2020-01-06 DIAGNOSIS — J302 Other seasonal allergic rhinitis: Secondary | ICD-10-CM | POA: Diagnosis not present

## 2020-01-06 DIAGNOSIS — M5126 Other intervertebral disc displacement, lumbar region: Secondary | ICD-10-CM | POA: Diagnosis not present

## 2020-01-06 DIAGNOSIS — E1122 Type 2 diabetes mellitus with diabetic chronic kidney disease: Secondary | ICD-10-CM | POA: Diagnosis not present

## 2020-01-06 DIAGNOSIS — I5032 Chronic diastolic (congestive) heart failure: Secondary | ICD-10-CM | POA: Diagnosis not present

## 2020-01-06 DIAGNOSIS — M47816 Spondylosis without myelopathy or radiculopathy, lumbar region: Secondary | ICD-10-CM | POA: Diagnosis not present

## 2020-01-06 DIAGNOSIS — I130 Hypertensive heart and chronic kidney disease with heart failure and stage 1 through stage 4 chronic kidney disease, or unspecified chronic kidney disease: Secondary | ICD-10-CM | POA: Diagnosis not present

## 2020-01-09 DIAGNOSIS — M47816 Spondylosis without myelopathy or radiculopathy, lumbar region: Secondary | ICD-10-CM | POA: Diagnosis not present

## 2020-01-09 DIAGNOSIS — I5032 Chronic diastolic (congestive) heart failure: Secondary | ICD-10-CM | POA: Diagnosis not present

## 2020-01-09 DIAGNOSIS — E1151 Type 2 diabetes mellitus with diabetic peripheral angiopathy without gangrene: Secondary | ICD-10-CM | POA: Diagnosis not present

## 2020-01-09 DIAGNOSIS — M797 Fibromyalgia: Secondary | ICD-10-CM | POA: Diagnosis not present

## 2020-01-09 DIAGNOSIS — H409 Unspecified glaucoma: Secondary | ICD-10-CM | POA: Diagnosis not present

## 2020-01-09 DIAGNOSIS — I700 Atherosclerosis of aorta: Secondary | ICD-10-CM | POA: Diagnosis not present

## 2020-01-09 DIAGNOSIS — J302 Other seasonal allergic rhinitis: Secondary | ICD-10-CM | POA: Diagnosis not present

## 2020-01-09 DIAGNOSIS — E1136 Type 2 diabetes mellitus with diabetic cataract: Secondary | ICD-10-CM | POA: Diagnosis not present

## 2020-01-09 DIAGNOSIS — I130 Hypertensive heart and chronic kidney disease with heart failure and stage 1 through stage 4 chronic kidney disease, or unspecified chronic kidney disease: Secondary | ICD-10-CM | POA: Diagnosis not present

## 2020-01-09 DIAGNOSIS — G4700 Insomnia, unspecified: Secondary | ICD-10-CM | POA: Diagnosis not present

## 2020-01-09 DIAGNOSIS — I2510 Atherosclerotic heart disease of native coronary artery without angina pectoris: Secondary | ICD-10-CM | POA: Diagnosis not present

## 2020-01-09 DIAGNOSIS — Z4781 Encounter for orthopedic aftercare following surgical amputation: Secondary | ICD-10-CM | POA: Diagnosis not present

## 2020-01-09 DIAGNOSIS — Z4789 Encounter for other orthopedic aftercare: Secondary | ICD-10-CM | POA: Diagnosis not present

## 2020-01-09 DIAGNOSIS — J9601 Acute respiratory failure with hypoxia: Secondary | ICD-10-CM | POA: Diagnosis not present

## 2020-01-09 DIAGNOSIS — E1122 Type 2 diabetes mellitus with diabetic chronic kidney disease: Secondary | ICD-10-CM | POA: Diagnosis not present

## 2020-01-09 DIAGNOSIS — G894 Chronic pain syndrome: Secondary | ICD-10-CM | POA: Diagnosis not present

## 2020-01-09 DIAGNOSIS — J449 Chronic obstructive pulmonary disease, unspecified: Secondary | ICD-10-CM | POA: Diagnosis not present

## 2020-01-09 DIAGNOSIS — E785 Hyperlipidemia, unspecified: Secondary | ICD-10-CM | POA: Diagnosis not present

## 2020-01-09 DIAGNOSIS — G4733 Obstructive sleep apnea (adult) (pediatric): Secondary | ICD-10-CM | POA: Diagnosis not present

## 2020-01-09 DIAGNOSIS — M5126 Other intervertebral disc displacement, lumbar region: Secondary | ICD-10-CM | POA: Diagnosis not present

## 2020-01-09 DIAGNOSIS — J9811 Atelectasis: Secondary | ICD-10-CM | POA: Diagnosis not present

## 2020-01-09 DIAGNOSIS — M069 Rheumatoid arthritis, unspecified: Secondary | ICD-10-CM | POA: Diagnosis not present

## 2020-01-09 DIAGNOSIS — E1142 Type 2 diabetes mellitus with diabetic polyneuropathy: Secondary | ICD-10-CM | POA: Diagnosis not present

## 2020-01-09 DIAGNOSIS — K219 Gastro-esophageal reflux disease without esophagitis: Secondary | ICD-10-CM | POA: Diagnosis not present

## 2020-01-09 DIAGNOSIS — N183 Chronic kidney disease, stage 3 unspecified: Secondary | ICD-10-CM | POA: Diagnosis not present

## 2020-01-11 DIAGNOSIS — N183 Chronic kidney disease, stage 3 unspecified: Secondary | ICD-10-CM | POA: Diagnosis not present

## 2020-01-11 DIAGNOSIS — H409 Unspecified glaucoma: Secondary | ICD-10-CM | POA: Diagnosis not present

## 2020-01-11 DIAGNOSIS — E785 Hyperlipidemia, unspecified: Secondary | ICD-10-CM | POA: Diagnosis not present

## 2020-01-11 DIAGNOSIS — M5126 Other intervertebral disc displacement, lumbar region: Secondary | ICD-10-CM | POA: Diagnosis not present

## 2020-01-11 DIAGNOSIS — J9601 Acute respiratory failure with hypoxia: Secondary | ICD-10-CM | POA: Diagnosis not present

## 2020-01-11 DIAGNOSIS — I130 Hypertensive heart and chronic kidney disease with heart failure and stage 1 through stage 4 chronic kidney disease, or unspecified chronic kidney disease: Secondary | ICD-10-CM | POA: Diagnosis not present

## 2020-01-11 DIAGNOSIS — J9811 Atelectasis: Secondary | ICD-10-CM | POA: Diagnosis not present

## 2020-01-11 DIAGNOSIS — K219 Gastro-esophageal reflux disease without esophagitis: Secondary | ICD-10-CM | POA: Diagnosis not present

## 2020-01-11 DIAGNOSIS — M797 Fibromyalgia: Secondary | ICD-10-CM | POA: Diagnosis not present

## 2020-01-11 DIAGNOSIS — I700 Atherosclerosis of aorta: Secondary | ICD-10-CM | POA: Diagnosis not present

## 2020-01-11 DIAGNOSIS — E1122 Type 2 diabetes mellitus with diabetic chronic kidney disease: Secondary | ICD-10-CM | POA: Diagnosis not present

## 2020-01-11 DIAGNOSIS — J302 Other seasonal allergic rhinitis: Secondary | ICD-10-CM | POA: Diagnosis not present

## 2020-01-11 DIAGNOSIS — M069 Rheumatoid arthritis, unspecified: Secondary | ICD-10-CM | POA: Diagnosis not present

## 2020-01-11 DIAGNOSIS — M47816 Spondylosis without myelopathy or radiculopathy, lumbar region: Secondary | ICD-10-CM | POA: Diagnosis not present

## 2020-01-11 DIAGNOSIS — E1142 Type 2 diabetes mellitus with diabetic polyneuropathy: Secondary | ICD-10-CM | POA: Diagnosis not present

## 2020-01-11 DIAGNOSIS — I5032 Chronic diastolic (congestive) heart failure: Secondary | ICD-10-CM | POA: Diagnosis not present

## 2020-01-11 DIAGNOSIS — Z4789 Encounter for other orthopedic aftercare: Secondary | ICD-10-CM | POA: Diagnosis not present

## 2020-01-11 DIAGNOSIS — E1136 Type 2 diabetes mellitus with diabetic cataract: Secondary | ICD-10-CM | POA: Diagnosis not present

## 2020-01-11 DIAGNOSIS — I2510 Atherosclerotic heart disease of native coronary artery without angina pectoris: Secondary | ICD-10-CM | POA: Diagnosis not present

## 2020-01-11 DIAGNOSIS — J449 Chronic obstructive pulmonary disease, unspecified: Secondary | ICD-10-CM | POA: Diagnosis not present

## 2020-01-11 DIAGNOSIS — G4700 Insomnia, unspecified: Secondary | ICD-10-CM | POA: Diagnosis not present

## 2020-01-11 DIAGNOSIS — G4733 Obstructive sleep apnea (adult) (pediatric): Secondary | ICD-10-CM | POA: Diagnosis not present

## 2020-01-11 DIAGNOSIS — E1151 Type 2 diabetes mellitus with diabetic peripheral angiopathy without gangrene: Secondary | ICD-10-CM | POA: Diagnosis not present

## 2020-01-11 DIAGNOSIS — G894 Chronic pain syndrome: Secondary | ICD-10-CM | POA: Diagnosis not present

## 2020-01-12 ENCOUNTER — Ambulatory Visit: Payer: Medicare Other | Admitting: Internal Medicine

## 2020-01-12 ENCOUNTER — Ambulatory Visit: Payer: Medicare Other | Admitting: Dietician

## 2020-01-16 ENCOUNTER — Ambulatory Visit (INDEPENDENT_AMBULATORY_CARE_PROVIDER_SITE_OTHER): Payer: Medicare Other | Admitting: Dietician

## 2020-01-16 ENCOUNTER — Ambulatory Visit (INDEPENDENT_AMBULATORY_CARE_PROVIDER_SITE_OTHER): Payer: Medicare Other | Admitting: Internal Medicine

## 2020-01-16 ENCOUNTER — Telehealth: Payer: Self-pay | Admitting: Internal Medicine

## 2020-01-16 ENCOUNTER — Other Ambulatory Visit: Payer: Self-pay

## 2020-01-16 ENCOUNTER — Encounter: Payer: Self-pay | Admitting: Dietician

## 2020-01-16 VITALS — BP 141/87 | HR 75 | Temp 98.0°F | Wt 256.4 lb

## 2020-01-16 DIAGNOSIS — T23161A Burn of first degree of back of right hand, initial encounter: Secondary | ICD-10-CM | POA: Diagnosis not present

## 2020-01-16 DIAGNOSIS — I10 Essential (primary) hypertension: Secondary | ICD-10-CM | POA: Diagnosis not present

## 2020-01-16 DIAGNOSIS — G8929 Other chronic pain: Secondary | ICD-10-CM | POA: Diagnosis not present

## 2020-01-16 DIAGNOSIS — R6889 Other general symptoms and signs: Secondary | ICD-10-CM | POA: Diagnosis not present

## 2020-01-16 DIAGNOSIS — IMO0002 Reserved for concepts with insufficient information to code with codable children: Secondary | ICD-10-CM

## 2020-01-16 DIAGNOSIS — R441 Visual hallucinations: Secondary | ICD-10-CM

## 2020-01-16 DIAGNOSIS — R413 Other amnesia: Secondary | ICD-10-CM

## 2020-01-16 DIAGNOSIS — F8081 Childhood onset fluency disorder: Secondary | ICD-10-CM

## 2020-01-16 DIAGNOSIS — E118 Type 2 diabetes mellitus with unspecified complications: Secondary | ICD-10-CM

## 2020-01-16 DIAGNOSIS — E1121 Type 2 diabetes mellitus with diabetic nephropathy: Secondary | ICD-10-CM

## 2020-01-16 DIAGNOSIS — R29818 Other symptoms and signs involving the nervous system: Secondary | ICD-10-CM

## 2020-01-16 DIAGNOSIS — J42 Unspecified chronic bronchitis: Secondary | ICD-10-CM

## 2020-01-16 DIAGNOSIS — Z794 Long term (current) use of insulin: Secondary | ICD-10-CM

## 2020-01-16 DIAGNOSIS — E1151 Type 2 diabetes mellitus with diabetic peripheral angiopathy without gangrene: Secondary | ICD-10-CM

## 2020-01-16 DIAGNOSIS — E1140 Type 2 diabetes mellitus with diabetic neuropathy, unspecified: Secondary | ICD-10-CM | POA: Diagnosis not present

## 2020-01-16 DIAGNOSIS — X16XXXA Contact with hot heating appliances, radiators and pipes, initial encounter: Secondary | ICD-10-CM

## 2020-01-16 DIAGNOSIS — R7989 Other specified abnormal findings of blood chemistry: Secondary | ICD-10-CM | POA: Diagnosis not present

## 2020-01-16 DIAGNOSIS — R269 Unspecified abnormalities of gait and mobility: Secondary | ICD-10-CM

## 2020-01-16 DIAGNOSIS — G479 Sleep disorder, unspecified: Secondary | ICD-10-CM

## 2020-01-16 DIAGNOSIS — T23131A Burn of first degree of multiple right fingers (nail), not including thumb, initial encounter: Secondary | ICD-10-CM

## 2020-01-16 DIAGNOSIS — I152 Hypertension secondary to endocrine disorders: Secondary | ICD-10-CM

## 2020-01-16 DIAGNOSIS — E1159 Type 2 diabetes mellitus with other circulatory complications: Secondary | ICD-10-CM

## 2020-01-16 DIAGNOSIS — Z79899 Other long term (current) drug therapy: Secondary | ICD-10-CM

## 2020-01-16 MED ORDER — HYDROCHLOROTHIAZIDE 25 MG PO TABS: 25.0000 mg | ORAL_TABLET | Freq: Every day | ORAL | 3 refills | Status: DC

## 2020-01-16 MED ORDER — TRAZODONE HCL 100 MG PO TABS: 100.0000 mg | ORAL_TABLET | Freq: Every evening | ORAL | 1 refills | Status: DC | PRN

## 2020-01-16 MED ORDER — BUPROPION HCL ER (XL) 300 MG PO TB24: 300.0000 mg | ORAL_TABLET | Freq: Every day | ORAL | 3 refills | Status: DC

## 2020-01-16 MED ORDER — OZEMPIC (0.25 OR 0.5 MG/DOSE) 2 MG/1.5ML ~~LOC~~ SOPN: 0.5000 mg | PEN_INJECTOR | SUBCUTANEOUS | 0 refills | Status: DC

## 2020-01-16 MED ORDER — FLUTICASONE PROPIONATE 50 MCG/ACT NA SUSP: NASAL | 3 refills | Status: DC

## 2020-01-16 MED ORDER — OMEPRAZOLE 20 MG PO CPDR: 20.0000 mg | DELAYED_RELEASE_CAPSULE | Freq: Two times a day (BID) | ORAL | 3 refills | Status: DC

## 2020-01-16 MED ORDER — BENAZEPRIL HCL 40 MG PO TABS: 40.0000 mg | ORAL_TABLET | Freq: Every day | ORAL | 3 refills | Status: DC

## 2020-01-16 MED ORDER — ALBUTEROL SULFATE HFA 108 (90 BASE) MCG/ACT IN AERS: INHALATION_SPRAY | RESPIRATORY_TRACT | 3 refills | Status: DC

## 2020-01-16 MED ORDER — TRESIBA FLEXTOUCH 200 UNIT/ML ~~LOC~~ SOPN: 70.0000 [IU] | PEN_INJECTOR | Freq: Every day | SUBCUTANEOUS | 0 refills | Status: DC

## 2020-01-16 MED ORDER — ROSUVASTATIN CALCIUM 20 MG PO TABS: 20.0000 mg | ORAL_TABLET | Freq: Every day | ORAL | 3 refills | Status: DC

## 2020-01-16 MED ORDER — MIRABEGRON ER 50 MG PO TB24: 50.0000 mg | ORAL_TABLET | Freq: Every day | ORAL | 3 refills | Status: DC

## 2020-01-16 MED ORDER — ANORO ELLIPTA 62.5-25 MCG/INH IN AEPB: 1.0000 | INHALATION_SPRAY | Freq: Every day | RESPIRATORY_TRACT | 3 refills | Status: DC

## 2020-01-16 MED ORDER — AMLODIPINE BESYLATE 10 MG PO TABS: 10.0000 mg | ORAL_TABLET | Freq: Every day | ORAL | 3 refills | Status: DC

## 2020-01-16 MED ORDER — HYDROXYZINE PAMOATE 25 MG PO CAPS: 50.0000 mg | ORAL_CAPSULE | Freq: Every day | ORAL | 0 refills | Status: DC

## 2020-01-16 MED ORDER — ATENOLOL 100 MG PO TABS: 100.0000 mg | ORAL_TABLET | Freq: Every day | ORAL | 3 refills | Status: DC

## 2020-01-16 MED ORDER — INSULIN LISPRO (1 UNIT DIAL) 100 UNIT/ML (KWIKPEN): PEN_INJECTOR | SUBCUTANEOUS | 0 refills | Status: DC

## 2020-01-16 MED ORDER — METFORMIN HCL ER 500 MG PO TB24: ORAL_TABLET | ORAL | 3 refills | Status: DC

## 2020-01-16 NOTE — Assessment & Plan Note (Addendum)
This problem is chronic and uncontrolled. Most recent A1C was 9.3 in November 2020. She is on Tresiba 70 QD, semaglutide 0.5 wkly, metformin XL 500 QD, and humalog TID if CBG is >176 per correction scale with 4-15 units admin.  She states she is not doing what she needs to do to get her diabetes under control.  She was unable to meet with [name] today but I am discussing her management with [name] and encouraged her to make an appointment next week with [name] when she returns.  She is on a good regimen.  A personal CGM would help to fine-tune her control but I believe a lot of her uncontrolled diabetes is due to lifestyle and diet which is a much harder intervention especially when she does not have her own home and has a chaotic home life.  PLAN : FU with [name]

## 2020-01-16 NOTE — Patient Instructions (Signed)
Please record the time, amount and what food drinks and activities you have while wearing the continuous glucose monitor (CGM). ? ?Bring the folder with you to follow up appointments. If your monitor falls off, please place it in the bag provided in your folder and bring it back with you to your next appointment.  ? ?Do not have a CT or an MRI while wearing the CGM.  ? ?1 week visit has been set up with me and a doctor for the first of two CGM downloads.  ? ?You will also return in 2 weeks to have your second download and the CGM removed. ? ?Halsey Hammen ?[phone] ? ? ?

## 2020-01-16 NOTE — Progress Notes (Signed)
Documentation for Freestyle Libre Pro Continuous glucose monitoring Freestyle Libre Pro CGM sensor placed today. Patient was educated about wearing sensor, keeping food, activity and medication log and when to call office. Patient was educated about how to care for the sensor and not to have an MRI, CT or Diathermy while wearing the sensor. Follow up was arranged with the patient for 1 week.   Lot #: [id number] A Serial #: [id number] Expiration Date: 02/19/2020  [name], RD 01/16/2020 1:21 PM.

## 2020-01-16 NOTE — Patient Instructions (Addendum)
1. [facility] back in one week for wound check 2. I refilled your medications 3. I will mail you your results
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
## 2020-01-16 NOTE — Telephone Encounter (Signed)
Pt reporting she was seen today in [facility]. Returned home and was told her Nephews roommate was very sick and tested positive for COVID overnight at the ED.  Pt reports being around the nephew and has concerns.  Please call the patient back.

## 2020-01-17 ENCOUNTER — Other Ambulatory Visit: Payer: Self-pay | Admitting: Dietician

## 2020-01-17 DIAGNOSIS — IMO0002 Reserved for concepts with insufficient information to code with codable children: Secondary | ICD-10-CM

## 2020-01-17 DIAGNOSIS — R441 Visual hallucinations: Secondary | ICD-10-CM | POA: Insufficient documentation

## 2020-01-17 DIAGNOSIS — E1151 Type 2 diabetes mellitus with diabetic peripheral angiopathy without gangrene: Secondary | ICD-10-CM

## 2020-01-17 LAB — VITAMIN B12: Vitamin B-12: 1246 pg/mL — ABNORMAL HIGH (ref 232–1245)

## 2020-01-17 LAB — T4, FREE: Free T4: 1.35 ng/dL (ref 0.82–1.77)

## 2020-01-17 LAB — TSH: TSH: 0.6 u[IU]/mL (ref 0.450–4.500)

## 2020-01-17 NOTE — Progress Notes (Signed)
Referral request
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
## 2020-01-17 NOTE — Assessment & Plan Note (Signed)
This problem is new.  At her last appointment, she complained of visual hallucinations when she was falling asleep or just waking up.  She continues to have those but also complains of resumption of her childhood stuttering and also memory loss.  The hallucinations continue to occur just upon waking up or falling asleep.  She stated that she woke up recently and had a very strong clear memory of a dog eating her cigarettes and she spent 10 minutes trying to convince her son that the dog had eaten her cigarettes.  The stuttering comes and goes and occurred once during our visit.  She is also noting memory loss which is disturbing to her.  She can remember details from the past but not things that occurred a day ago.  She also notices that she will enter her room and forget why she was there.  At her last appointment, she reminded me that her mother died of Parkinson's disease.  She is also complained of tremors in her hands but I never felt that these were resting tremors. I will start of work-up with vitamin B12 even though she takes oral B12, TSH, and a MRI of the head with and without contrast.  Depending on the results, she might warrant referral to neurology for further evaluation.  PLAN :B12, TSH, MRI head

## 2020-01-18 ENCOUNTER — Ambulatory Visit: Payer: Medicare Other | Attending: Internal Medicine

## 2020-01-18 ENCOUNTER — Telehealth: Payer: Self-pay | Admitting: *Deleted

## 2020-01-18 DIAGNOSIS — Z20822 Contact with and (suspected) exposure to covid-19: Secondary | ICD-10-CM | POA: Diagnosis not present

## 2020-01-18 NOTE — Telephone Encounter (Signed)
From: Shela Nevin Sent: 11/30/2019  10:55 AM EST To: [name], RN Subject: RE: Power w/c                                  Thank you ! I will be on the lookout.   ----- Message ----- From: [name], RN Sent: 11/30/2019  10:31 AM EST To: Shela Nevin, [place], [place] Subject: RE: Power w/c                                  Hi Debbie,  I just faxed the SRO to you with the NPI #. I got a confirmation receipt so, hopefully, you received it.  Thanks!  Lauren ----- Message ----- From: Shela Nevin Sent: 11/29/2019  12:26 PM EST To: [name], RN Subject: RE: Power w/c                                  Lauren -   I received the signatures and all is good except the Standard Written Order. It is missing NPI number. Can you please add that and fax it back to me to [phone].  Thank you!  ----- Message ----- From: [name], RN Sent: 11/24/2019  11:40 AM EST To: Shela Nevin, [place], [place] Subject: RE: Power w/c                                  It will be Dr. Lonia Skinner. I didn't receive the fax you sent but I do have the Mobility Assessment for power w/c that you've sent before. Is that all it is? ----- Message ----- From: Shela Nevin Sent: 11/23/2019   5:00 PM EST To: Shela Nevin, [place], [place], * Subject: RE: Power w/c                                  Yes - which Resident will be handling the telehealth appt please?  ----- Message ----- From: [name], RN Sent: 11/23/2019   3:07 PM EST To: Shela Nevin, [place], [place] Subject: Power w/c                                      Hi Debbie,  I have scheduled this patient for telehealth appt tomorrow afternoon to discuss need for power w/c. Can you send exactly what needs to be written so I can forward to the Resident making the call? Thanks!

## 2020-01-18 NOTE — Progress Notes (Signed)
done

## 2020-01-18 NOTE — Telephone Encounter (Signed)
thanks
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
## 2020-01-18 NOTE — Telephone Encounter (Signed)
Community message sent to Andria Rhein at [facility] to learn status of patient's power w.c. [name], BSN, RN-BC

## 2020-01-19 LAB — NOVEL CORONAVIRUS, NAA: SARS-CoV-2, NAA: NOT DETECTED

## 2020-01-19 NOTE — Telephone Encounter (Signed)
Jennifer [name], Orvis Brill, RN  We are waiting for approval from insurance. I reached out to our submission team to follow up with insurance.  Thank you!

## 2020-01-19 NOTE — Telephone Encounter (Signed)
Jennifer [name], Orvis Brill, RN   I received a message from our team that patient was denied by insurance and I am awaiting further details. I am on vacation today and tomorrow, so it might be next week before I can respond.   Thank you so much for your follow up !

## 2020-01-19 NOTE — Telephone Encounter (Signed)
Thanks

## 2020-01-23 ENCOUNTER — Telehealth: Payer: Self-pay | Admitting: Internal Medicine

## 2020-01-23 NOTE — Telephone Encounter (Signed)
Unable to get in touch with patient to schedule follow up per Dr. [name].  Left detailed message asking to please give the clinic a call back.

## 2020-01-23 NOTE — Telephone Encounter (Signed)
-----   Message from [name], RN sent at 01/23/2020 12:22 PM EST ----- Her test did come back negative for Covid. ----- Message ----- From: Meriam Sprague Sent: 01/23/2020   9:11 AM EST To: Imp Triage Nurse Pool  Please read message below.  Thanks,  Best boy ----- Message ----- From: Hulan Fray, RN Sent: 01/18/2020   3:51 PM EST To: Chauncey Reading Plyler, RD, Meriam Sprague, #  Pt just got tested today, 1/27 - how do you feel about scheduling pt for 2/2, so we can know results on 2/1 and make final decision about telephonic vs in-person follow up? She did take additional wound care supplies home with her and could replace current dressing if she is positive and not feeling well as long as wound healing is progressing - could add her to nurse only schedule for f/u call on 2/1 as well as ACC visit 2/2 to cover all contingencies? If you think OK medically-speaking, Dr. [name]?  Thanks, Ivin Booty  ----- Message ----- From: Bartholomew Crews, MD Sent: 01/18/2020   1:22 PM EST To: Hulan Fray, RN, Chauncey Reading Plyler, RD, #  Telephone screen the day before for symptoms.  She wears a mask for whole appointment meaning she cannot drink coffee like she normally does.  Staff to wear appropriate PPE.  I am CCing sharon to ensure this meets our policy. ----- Message ----- From: Meriam Sprague Sent: 01/18/2020   9:34 AM EST To: Bartholomew Crews, MD  Please refer to last phone note by [facility] on 01/16/2020 in reference to patient possible being exposed to [place].  And let me know how you would like to proceed.  Thanks,  [place] ----- Message ----- From: Bartholomew Crews, MD Sent: 01/17/2020  11:20 AM EST To: Imp Front Desk Pool  Pls sch ACC first Monday in Feb wound check L 5th finger

## 2020-01-24 ENCOUNTER — Ambulatory Visit: Payer: Medicare Other | Admitting: Dietician

## 2020-01-24 ENCOUNTER — Ambulatory Visit: Payer: Medicare Other

## 2020-01-24 ENCOUNTER — Ambulatory Visit: Payer: Medicare Other | Admitting: Orthopedic Surgery

## 2020-01-26 ENCOUNTER — Encounter: Payer: Self-pay | Admitting: Dietician

## 2020-01-26 ENCOUNTER — Encounter: Payer: Self-pay | Admitting: Radiation Oncology

## 2020-01-26 ENCOUNTER — Ambulatory Visit (INDEPENDENT_AMBULATORY_CARE_PROVIDER_SITE_OTHER): Payer: Medicare Other | Admitting: Radiation Oncology

## 2020-01-26 ENCOUNTER — Other Ambulatory Visit: Payer: Self-pay

## 2020-01-26 ENCOUNTER — Ambulatory Visit (INDEPENDENT_AMBULATORY_CARE_PROVIDER_SITE_OTHER): Payer: Medicare Other | Admitting: Dietician

## 2020-01-26 VITALS — BP 147/51 | HR 80 | Temp 98.0°F

## 2020-01-26 DIAGNOSIS — IMO0002 Reserved for concepts with insufficient information to code with codable children: Secondary | ICD-10-CM

## 2020-01-26 DIAGNOSIS — E1151 Type 2 diabetes mellitus with diabetic peripheral angiopathy without gangrene: Secondary | ICD-10-CM

## 2020-01-26 DIAGNOSIS — E1165 Type 2 diabetes mellitus with hyperglycemia: Secondary | ICD-10-CM

## 2020-01-26 DIAGNOSIS — X16XXXD Contact with hot heating appliances, radiators and pipes, subsequent encounter: Secondary | ICD-10-CM | POA: Diagnosis not present

## 2020-01-26 DIAGNOSIS — Z713 Dietary counseling and surveillance: Secondary | ICD-10-CM | POA: Diagnosis not present

## 2020-01-26 DIAGNOSIS — T23031D Burn of unspecified degree of multiple right fingers (nail), not including thumb, subsequent encounter: Secondary | ICD-10-CM | POA: Diagnosis not present

## 2020-01-26 DIAGNOSIS — T23161D Burn of first degree of back of right hand, subsequent encounter: Secondary | ICD-10-CM

## 2020-01-26 NOTE — Patient Instructions (Addendum)
Please throw away the previous Humalog scale. Use this now...  If blood sugar is < 100 inject 5 units before meals If blood sugar is 101-175  inject 10 units before meals 176-225 inject 12 units  226-275 inject 14 units  276-325 inject 16 units  326-375 inject 18 units  376-425 inject 20 units  426-475 inject 22 units  476-525 inject 24 units and call office for further instructions  Keep your meter and a treatment for low blood sugar with you at all times. Raisins are great!  I will see you in 1 week.
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
## 2020-01-26 NOTE — Assessment & Plan Note (Addendum)
Jennifer [name] has a hx of uncontrolled DM.   She wore the CGM for 11 days. The average reading was 223, % time in target was >70, % time below target was <5, and % time above target was <30. Intervention will be to increase short acting insulin around mealtimes. The patient will be scheduled to see [name] for a final appointment.   Plan: -continue wearing CGM -fu w/ [name] in 1 week
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
## 2020-01-26 NOTE — Progress Notes (Signed)
Internal Medicine Clinic Attending  Case discussed with Dr. Lanierat the time of the visit.  We reviewed the resident's history and exam and pertinent patient test results.  I agree with the assessment, diagnosis, and plan of care documented in the resident's note.   
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
## 2020-01-26 NOTE — Progress Notes (Signed)
Diabetes Self-Management Education  Visit Type: Follow-up(annual)  Appt. Start Time: 1320 Appt. End Time: [id number]  01/26/2020  Jennifer. Jennifer [name], identified by name and date of birth, is a 59 y.o. female with a diagnosis of Diabetes:  Marland Kitchen Type 2  ASSESSMENT Consult per Dr. [name] to assist with Jennifer [name]'s diabetes control and CGM.   CGM Results from Download #1  Average is  223  for 11 days Glucose management indicator 8.6 % Time in range (70-180 mg/dL): 26 % (Goal >70%) Time High (181-250 mg/dL) 43 % (Goal < 25%) Time Very High (>250 mg/dl) 31 % (Goal < 5%) Time Low (54-69 mg/dL): 0 % (Goal is <4%) Time Very Low (<54) 0%  (Goal <1%)  Coefficient of variation:29.9% (Goal is <36%)  She has been taking the correction dose about 2 times a day before eating and usually for a total af about 25 units a day. Today, we added a food dose to her correction doses to decrease the emal rises. Two copies were provided to patient, she will continue to wear the CG< and follow up in 1 week. She would like to own and use a personal CGM and agrees to check her blood sugar at least 4 times a day and bring her meter with her next week.  Diabetes Self-Management Education - 01/26/20 1600      Visit Information   Visit Type  Follow-up   annual     Health Coping   How would you rate your overall health?  Good      Pre-Education Assessment   Patient understands using medications safely.  Needs Review    Patient understands monitoring blood glucose, interpreting and using results  Needs Review      Complications   Number of hypoglycemic episodes per month  0    Number of hyperglycemic episodes per week  43    Can you tell when your blood sugar is high?  Yes   sometimes   What do you do if your blood sugar is high?  --   drinks liquids   Have you had a dilated eye exam in the past 12 months?  No    Have you had a dental exam in the past 12 months?  No   does not have teeth   Are you checking your  feet?  No      Patient Education   Medications  Reviewed patients medication for diabetes, action, purpose, timing of dose and side effects.;Reviewed medication adjustment guidelines for hyperglycemia and sick days.    Monitoring  Identified appropriate SMBG and/or A1C goals.;Yearly dilated eye exam      Individualized Goals (developed by patient)   Medications  take my medication as prescribed    Monitoring   test my blood glucose as discussed      Outcomes   Expected Outcomes  Demonstrated interest in learning. Expect positive outcomes    Future DMSE  Other (comment)   1 weeks for cgm 2 download   Program Status  Not Completed      Subsequent Visit   Since your last visit have you continued or begun to take your medications as prescribed?  Yes    Since your last visit have you had your blood pressure checked?  No    Since your last visit have you experienced any weight changes?  No change    Since your last visit, are you checking your blood glucose at least once a day?  Yes  forgot her meter today      Individualized Plan for Diabetes Self-Management Training:   Learning Objective:  Patient will have a greater understanding of diabetes self-management. Patient education plan is to attend individual and/or group sessions per assessed needs and concerns.   Plan:   Patient Instructions  Please throw away the previous Humalog scale. Use this now...  If blood sugar is < 100 inject 5 units before meals If blood sugar is 101-175  inject 10 units before meals 176-225 inject 12 units  226-275 inject 14 units  276-325 inject 16 units  326-375 inject 18 units  376-425 inject 20 units  426-475 inject 22 units  476-525 inject 24 units and call office for further instructions  Keep your meter and a treatment for low blood sugar with you at all times. Raisins are great!  I will see you in 1 week.   Expected Outcomes:  Demonstrated interest in learning. Expect positive  outcomes  Education material provided: Diabetes Resources  If problems or questions, patient to contact team via:  Phone  Future DSME appointment: Other (comment)(1 weeks for cgm 2 download) Jennifer [name], RD 01/26/2020 4:41 PM.

## 2020-01-29 ENCOUNTER — Other Ambulatory Visit: Payer: Self-pay | Admitting: Internal Medicine

## 2020-01-29 DIAGNOSIS — J42 Unspecified chronic bronchitis: Secondary | ICD-10-CM

## 2020-01-30 ENCOUNTER — Other Ambulatory Visit: Payer: Self-pay | Admitting: Internal Medicine

## 2020-01-31 ENCOUNTER — Encounter: Payer: Self-pay | Admitting: Orthopedic Surgery

## 2020-01-31 ENCOUNTER — Other Ambulatory Visit: Payer: Self-pay

## 2020-01-31 ENCOUNTER — Ambulatory Visit (INDEPENDENT_AMBULATORY_CARE_PROVIDER_SITE_OTHER): Payer: Medicare Other | Admitting: Orthopedic Surgery

## 2020-01-31 VITALS — Ht 67.0 in | Wt 256.0 lb

## 2020-01-31 DIAGNOSIS — M75121 Complete rotator cuff tear or rupture of right shoulder, not specified as traumatic: Secondary | ICD-10-CM
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
## 2020-02-01 ENCOUNTER — Telehealth: Payer: Self-pay | Admitting: Internal Medicine

## 2020-02-01 NOTE — Telephone Encounter (Signed)
Pt requesting help with trying to obtain a [employer].  Please call call patient back.

## 2020-02-01 NOTE — Telephone Encounter (Signed)
Patient was made aware by [place] and at last OV that insurance is declining to pay for new power w/c. She was also given contact info for Andria Rhein at [employer] to discuss. [name], BSN, RN-BC

## 2020-02-02 ENCOUNTER — Ambulatory Visit (INDEPENDENT_AMBULATORY_CARE_PROVIDER_SITE_OTHER): Payer: Medicare Other | Admitting: Radiation Oncology

## 2020-02-02 ENCOUNTER — Ambulatory Visit: Payer: Medicare Other | Admitting: Dietician

## 2020-02-02 ENCOUNTER — Encounter: Payer: Self-pay | Admitting: Dietician

## 2020-02-02 ENCOUNTER — Ambulatory Visit (INDEPENDENT_AMBULATORY_CARE_PROVIDER_SITE_OTHER): Payer: Medicare Other | Admitting: Dietician

## 2020-02-02 ENCOUNTER — Other Ambulatory Visit: Payer: Self-pay | Admitting: Internal Medicine

## 2020-02-02 VITALS — BP 102/75 | HR 75 | Wt 258.7 lb

## 2020-02-02 DIAGNOSIS — T23161D Burn of first degree of back of right hand, subsequent encounter: Secondary | ICD-10-CM

## 2020-02-02 DIAGNOSIS — E1165 Type 2 diabetes mellitus with hyperglycemia: Secondary | ICD-10-CM

## 2020-02-02 DIAGNOSIS — X088XXD Exposure to other specified smoke, fire and flames, subsequent encounter: Secondary | ICD-10-CM

## 2020-02-02 DIAGNOSIS — IMO0002 Reserved for concepts with insufficient information to code with codable children: Secondary | ICD-10-CM

## 2020-02-02 DIAGNOSIS — T23061D Burn of unspecified degree of back of right hand, subsequent encounter: Secondary | ICD-10-CM | POA: Diagnosis not present

## 2020-02-02 DIAGNOSIS — E1151 Type 2 diabetes mellitus with diabetic peripheral angiopathy without gangrene: Secondary | ICD-10-CM | POA: Diagnosis not present

## 2020-02-02 DIAGNOSIS — Z713 Dietary counseling and surveillance: Secondary | ICD-10-CM

## 2020-02-02 DIAGNOSIS — T300 Burn of unspecified body region, unspecified degree: Secondary | ICD-10-CM

## 2020-02-02 NOTE — Assessment & Plan Note (Signed)
This has worsened since her last visit. The wounds had appeared to be improving at her last visit but there now appears to be pus in the center of the 3rd and 4th digit wounds. She did not wear her dressing as instructed. She reports pain. She has no fevers or chills. On exam there are wounds over L 3rd, 4th and 5th digits proximal to the MCP joints; 3rd and 4th digit wounds approximately 1 x 2 and 1 x 3 cm respectively with central erosion with pustular appearing discharge and surrounding hypopigmentation. 5th digit wound with central hypopigmentation and surrounding hyperpigmentation. Area around wounds tender to touch. No bleeding or fluctuance.  Plan: -referral to plastic surgery; appointment scheduled for tomorrow afternoon

## 2020-02-02 NOTE — Patient Instructions (Addendum)
We increased your food dose of Novolog today by 5 units.  Please call if not working better for your blood sugars.  Otherwise, see you in two weeks.  [name] [phone]  Patient Instructions  Please throw away the previous Humalog scale. Use this now...   If blood sugar is 90-120 inject 10 units before meals If blood sugar is 121-175  inject 15 units before meals 176-225 inject 18 units  226-275 inject 20 units  276-325 inject 24 units  326-375 inject 26 units  376-425 inject 28 units  426-475 inject 30 units  476-525 inject 35 units and call office for further instructions
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
## 2020-02-02 NOTE — Progress Notes (Signed)
[]  optho
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
## 2020-02-02 NOTE — Progress Notes (Signed)
Diabetes Self-Management Education  Visit Type: Follow-up  Appt. Start Time: 1345 Appt. End Time: [id number]  02/02/2020  Jennifer [name], identified by name and date of birth, is a 59 y.o. female with a diagnosis of Diabetes:  Type 2  ASSESSMENT  Meter downloaded: 32 readings /90 days- patient has another meter at home. The average for past 90 days is 372 standard deviation of 113. Slight pattern of being lower morning and evening and higher midday.   CGM Results from Download #2  Average is  239  for 15 days Glucose management indicator 9 % Time in range (70-180 mg/dL): 22 % (Goal >70%) Time High (181-250 mg/dL) 38 % (Goal < 25%) Time Very High (>250 mg/dl) 40 % (Goal < 5%) Time Low (54-69 mg/dL): 0 % (Goal is <4%) Time Very Low (<54) 0%  (Goal <1%)  Coefficient of variation:30.7% (Goal is <36%)  Food dose increased to 15 units and continue with correction.  Estimated TDD 120 units/day. CF=15, ICR~1:4grams. Adjust correction next visit as needed. Explore personal CGM per patient request.   Diabetes Self-Management Education - 02/02/20 1600      Visit Information   Visit Type  Follow-up      Health Coping   How would you rate your overall health?  Good      Exercise   Exercise Type  ADL's      Patient Education   Medications  Reviewed patients medication for diabetes, action, purpose, timing of dose and side effects.    Monitoring  Yearly dilated eye exam;Other (comment)   requested eye exam,reveiwed CGM download     Patient Self-Evaluation of Goals - Patient rates self as meeting previously set goals (% of time)   Medications  50 - 75 %   says she missed tresiba 1 day, and Novolog 1-2 times daily   Monitoring  25 - 50%   wants a CGM     Outcomes   Expected Outcomes  Demonstrated interest in learning. Expect positive outcomes    Future DMSE  2 wks    Program Status  Completed      Subsequent Visit   Since your last visit have you continued or begun to take your  medications as prescribed?  Yes   as best she can remember   Since your last visit have you had your blood pressure checked?  No    Since your last visit have you experienced any weight changes?  No change    Since your last visit, are you checking your blood glucose at least once a day?  Yes   meter downloaded, CGM 2 downloaded      Individualized Plan for Diabetes Self-Management Training:   Learning Objective:  Patient will have a greater understanding of diabetes self-management. Patient education plan is to attend individual and/or group sessions per assessed needs and concerns.   Plan:   Patient Instructions   We increased your food dose of Novolog today by 5 units.  Please call if not working better for your blood sugars.  Otherwise, see you in two weeks.  [name] [phone]  Patient Instructions  Please throw away the previous Humalog scale. Use this now...   If blood sugar is 90-120 inject 10 units before meals If blood sugar is 121-175  inject 15 units before meals 176-225 inject 18 units  226-275 inject 20 units  276-325 inject 24 units  326-375 inject 26 units  376-425 inject 28 units  426-475 inject 30 units  [id number] inject 35 units and call office for further instructions  Expected Outcomes:  Demonstrated interest in learning. Expect positive outcomes Education material provided: Diabetes Resources If problems or questions, patient to contact team via:  Phone Future DSME appointment: 2 wks  [name], RD 02/02/2020 5:10 PM.

## 2020-02-03 ENCOUNTER — Other Ambulatory Visit: Payer: Self-pay

## 2020-02-03 ENCOUNTER — Ambulatory Visit (INDEPENDENT_AMBULATORY_CARE_PROVIDER_SITE_OTHER): Payer: Medicare Other | Admitting: Plastic Surgery

## 2020-02-03 ENCOUNTER — Encounter: Payer: Self-pay | Admitting: Plastic Surgery

## 2020-02-03 VITALS — BP 158/81 | HR 75 | Temp 97.1°F | Ht 67.0 in

## 2020-02-03 DIAGNOSIS — S61402A Unspecified open wound of left hand, initial encounter: Secondary | ICD-10-CM | POA: Diagnosis not present
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
## 2020-02-03 NOTE — Progress Notes (Signed)
Internal Medicine Clinic Attending  Case discussed with Dr. Lanierat the time of the visit.  We reviewed the resident's history and exam and pertinent patient test results.  I agree with the assessment, diagnosis, and plan of care documented in the resident's note.   

## 2020-02-06 ENCOUNTER — Telehealth: Payer: Self-pay | Admitting: *Deleted

## 2020-02-06 NOTE — Telephone Encounter (Signed)
Faxed order on (02/03/20) to [facility] for supplies for the patient.  Confirmation received.      Received order status notification from [facility].  Stating [facility] has provided service for the patient; no further action is required.//AB/CMA

## 2020-02-08 ENCOUNTER — Telehealth: Payer: Self-pay | Admitting: Internal Medicine

## 2020-02-08 MED ORDER — OXYCODONE-ACETAMINOPHEN 5-325 MG PO TABS: ORAL_TABLET | ORAL | 0 refills | Status: DC

## 2020-02-08 NOTE — Telephone Encounter (Signed)
Pt is wanting a call back regarding wheelchair; pt contact [phone]

## 2020-02-08 NOTE — Telephone Encounter (Signed)
She told me she couldn't afford powerchair copay back in 2018. I do not know how I can help.

## 2020-02-08 NOTE — Telephone Encounter (Signed)
Return call made to patient regarding her dme request for power w/c.  Pcp recently authorized order for power w/c, but insurance records show that she was approved and received power w/c in May 2018, therefor the request was denied.  Pt states she has never received a power w/c in the past and presented a "notice of denial" which states she can have her physician appeal the decision.  Pt is requesting assistance with this appeal.  Pt was also given number to Andria Rhein [phone] [facility]) to help assist her in this matter. Will send info to pcp and place notice of denial in pcp's box.Jennifer Hidden Cassady2/17/20213:19 PM

## 2020-02-08 NOTE — Telephone Encounter (Signed)
[employer] has informed patient that her insurance has denied her request for new power w/c. Have sent a community message to Andria Rhein at [employer] this AM asking for reason. Awaiting reply. Will contact patient once more information has been obtained. [name], BSN, RN-BC

## 2020-02-08 NOTE — Telephone Encounter (Signed)
Patient is calling back pls contact [phone]

## 2020-02-08 NOTE — Telephone Encounter (Signed)
Pt is requesting [facility], last RX was written on 12/02/19 by another provider. Dr [name], Please see telephone note dated 12/02/19 regarding refills.  LOV 02/02/20 in [place] 02/16/20 w/ [name] UDS 06/16/19  Thank you, SChaplin, RN,BSN

## 2020-02-08 NOTE — Telephone Encounter (Signed)
Need refill on oxyCODONE-acetaminophen (PERCOCET/ROXICET) 5-325 MG tablet  ;pt contact [phone]   [id number] [place] [place] [address] [place].

## 2020-02-09 ENCOUNTER — Ambulatory Visit: Payer: Medicare Other | Admitting: Rehabilitative and Restorative Service Providers"

## 2020-02-10 ENCOUNTER — Other Ambulatory Visit: Payer: Self-pay | Admitting: Internal Medicine

## 2020-02-10 DIAGNOSIS — Z89512 Acquired absence of left leg below knee: Secondary | ICD-10-CM

## 2020-02-15 ENCOUNTER — Ambulatory Visit: Payer: Medicare Other | Admitting: Surgical

## 2020-02-16 ENCOUNTER — Ambulatory Visit: Payer: Medicare Other | Admitting: Dietician

## 2020-02-16 ENCOUNTER — Other Ambulatory Visit: Payer: Self-pay

## 2020-02-16 ENCOUNTER — Encounter: Payer: Self-pay | Admitting: Surgical

## 2020-02-16 ENCOUNTER — Encounter: Payer: Self-pay | Admitting: Dietician

## 2020-02-16 ENCOUNTER — Ambulatory Visit (INDEPENDENT_AMBULATORY_CARE_PROVIDER_SITE_OTHER): Payer: Medicare Other | Admitting: Surgical

## 2020-02-16 VITALS — BP 120/79 | HR 88 | Temp 98.4°F | Ht 67.0 in | Wt 259.0 lb

## 2020-02-16 DIAGNOSIS — S61402D Unspecified open wound of left hand, subsequent encounter: Secondary | ICD-10-CM | POA: Diagnosis not present

## 2020-02-16 NOTE — Addendum Note (Signed)
Addended by: Larey Dresser A on: 02/16/2020 09:43 AM   Modules accepted: Orders

## 2020-02-16 NOTE — Progress Notes (Signed)
  Diabetes Self-Management Education  Visit Type:  Follow up  Appt. Start Time: 1500 Appt. End Time: 1520  02/16/2020  [name], identified by name and date of birth, is a 59 y.o. female with a diagnosis of Diabetes:  Type 2  ASSESSMENT/PLAN  [name] shows me bite marks on both her arms. She believes she has bedbugs or fleas in the love seat where she sleeps.  Self monitoring: Meter downloaded: 22 readings/30 days- which is an increase. She brought her main meter today that she uses most of the time. The average for past 30 days is 297 standard deviation of 116. Lowest was 86 today after Humalog 18 units this am ,some soda and crackers and cereal. Highest was HI Slight pattern of being lower morning and higher midday and evening  She checked her sugar again while here and it increased from 86 to 96 after eating 2 crackers and drinking coffee with cream and sugar. Asked again what she has to do to be able to use a personal CGM. We discussed the need for her to check her blood sugar more frequently.   Diabetes Medicine: No change in her Novolog doses today. She states that sometimes it is hard for her to get to her medicine because of her living situation. She agrees to social work referral   Follow up in 1 month     [name], RD 02/16/2020 4:18 PM.

## 2020-02-16 NOTE — Patient Instructions (Signed)
Hi Jennifer [name],  Thank you for your visit today!   Please continue to check your blood sugar as much as you can .  Try for about 4 times a day. This is a Medicare requirement to get the Continuous glucose monitor.   I made you a follow up for 4 weeks.   Please call before that if you have questions or concerns.  [name] [phone]

## 2020-02-16 NOTE — Progress Notes (Signed)
   Subjective:     [name], female    DOB: May 18, 1961, 59 y.o.   MRN: [id number]  Chief Complaint  Patient presents with  . Follow-up    Patient her for follow up visit from open wounds of multiple sites of left hand    HPI: The patient is a 59 y.o. female here for follow-up on her left hand wounds.  She has been doing daily dressing changes with Xeroform -she received these supplies in the mail.  She has also been wearing a glove over it. She reports washing it with water and soap between dressing changes.  She reports pain has improved, still having some pain and some joint stiffness.  She does have a history of RA.  No sign of infection.  No fever, chills.  She is smoking approximately half a pack to 1 pack/day of cigarettes. She has not had any fevers or chills.  Review of Systems  Constitutional: Negative for chills and fever.  Musculoskeletal: Positive for arthralgias.  Skin: Positive for wound.     Objective:   Vital Signs BP 120/79 (BP Location: Right Arm, Patient Position: Sitting, Cuff Size: Large)   Pulse 88   Temp 98.4 F (36.9 C) (Temporal)   Ht 5\' 7"  (1.702 m)   Wt 259 lb (117.5 kg)   SpO2 97%   BMI 40.57 kg/m  Vital Signs and Nursing Note Reviewed Chaperone present Physical Exam  Constitutional: She is oriented to person, place, and time and well-developed, well-nourished, and in no distress.  Neurological: She is alert and oriented to person, place, and time.  Skin: Skin is warm and dry. She is not diaphoretic.  Left hand wounds improving.  Yellow exudate has sloughed off.  Left small finger wound has completely epithelialized, ring finger wound significantly smaller with new epithelium at the edges..  Left long finger wound with good base of granular tissue and new epithelium noted at the edges.  No periwound erythema.  No foul odor.  No drainage noted.   Psychiatric: Mood and affect normal.    Assessment/Plan:     ICD-10-CM   1.  Open wounds of multiple sites of hand, left, subsequent encounter  S61.402D    Despite her comorbidities, patient is healing left hand wounds very nicely.  She continues to smoke 0.5 to 1 pack/day of cigarettes.  Continue with daily Xeroform dressing changes, washing with soap and water between dressing changes.  If she would like she can continue to wear the glove, but she does not have to.  Redressed the wounds today with Xeroform, 2 x 2 gauze, [place].  Call with any questions or concerns, follow-up in 3 weeks for reevaluation.  Hopeful that her wounds will continue to progress and be healed up at that point.  Pictures were obtained of the patient and placed in the chart with the patient's or guardian's permission.  Carola Rhine Alexes Lamarque, PA-C 02/16/2020, 1:05 PM

## 2020-02-17 ENCOUNTER — Telehealth: Payer: Self-pay | Admitting: Internal Medicine

## 2020-02-17 ENCOUNTER — Other Ambulatory Visit: Payer: Self-pay | Admitting: Internal Medicine

## 2020-02-17 NOTE — Telephone Encounter (Signed)
Rec'd a call from Verdon B. a Care Coordinator with [facility] [employer] requesting a Peer-to Peer call from the pt's PCP.  The patient was approved for a PWC back in 2018.  Per Beau Fanny it has been verified with the former DME  Supplier [facility]) that the patient did not receive the [facility] due to lack of Funds at the time  Per Tameka a new request for a PWC was submitted on 11/2019 and was denied due to lack of medical necessity. An Appeal can still be processed for the patient by calling [phone] to submit a new clinical review for the patient to be reviewed as soon as possible.

## 2020-02-20 ENCOUNTER — Encounter: Payer: Self-pay | Admitting: Internal Medicine

## 2020-02-20 ENCOUNTER — Other Ambulatory Visit: Payer: Self-pay | Admitting: Internal Medicine

## 2020-02-20 NOTE — Telephone Encounter (Signed)
Called. Appeal not an option Written appeal only option. Case # [id number] Fax [phone] Any documenTAtion, Cover letter inc ref # MY INFO, PT INFO

## 2020-02-20 NOTE — Telephone Encounter (Signed)
Appeal letter, ov notes from 11/24/19, and snap shot faxed to [facility] and [employer].Despina Hidden Cassady3/1/20214:18 PM

## 2020-02-21 ENCOUNTER — Ambulatory Visit: Payer: Medicare Other | Admitting: Physical Therapy

## 2020-02-23 NOTE — Telephone Encounter (Addendum)
Following community message received 02/21/2020:   Jacob Moores, Orvis Brill, RN; [place], [place], [place]  Cloretta Ned,   I have followed up on this again with my team. It states in our notes that a team member called patient yesterday and left a VM to call her back regarding this.   I have asked team what is next step.

## 2020-02-28 ENCOUNTER — Ambulatory Visit: Payer: Medicare Other

## 2020-02-28 ENCOUNTER — Telehealth: Payer: Self-pay | Admitting: Dietician

## 2020-02-28 ENCOUNTER — Ambulatory Visit: Payer: Medicare Other | Admitting: *Deleted

## 2020-02-28 ENCOUNTER — Ambulatory Visit: Payer: Medicare Other | Admitting: Physical Therapy

## 2020-02-28 DIAGNOSIS — E1151 Type 2 diabetes mellitus with diabetic peripheral angiopathy without gangrene: Secondary | ICD-10-CM

## 2020-02-28 DIAGNOSIS — IMO0002 Reserved for concepts with insufficient information to code with codable children: Secondary | ICD-10-CM

## 2020-02-28 DIAGNOSIS — J441 Chronic obstructive pulmonary disease with (acute) exacerbation: Secondary | ICD-10-CM

## 2020-02-28 DIAGNOSIS — J449 Chronic obstructive pulmonary disease, unspecified: Secondary | ICD-10-CM

## 2020-02-28 MED ORDER — ONETOUCH VERIO FLEX SYSTEM W/DEVICE KIT: PACK | 1 refills | Status: DC

## 2020-02-28 NOTE — Telephone Encounter (Signed)
Having meter problems with all three meters. It sounds like she needs new batteries. She cannot afford batteries right now. Request new meter.

## 2020-02-28 NOTE — Chronic Care Management (AMB) (Signed)
Chronic Care Management   Initial Visit Note  02/28/2020 Name: Jennifer [name] MRN: [id number] DOB: 07-Mar-1961  Referred by: Bartholomew Crews, MD Reason for referral : Chronic Care Management (Initial assessment - DM 2 and COPD)   Jennifer [name] is a 59 y.o. year old female who is a primary care patient of Bartholomew Crews, MD. The CCM team was consulted for assistance with chronic disease management and care coordination needs related to CHF, HTN, COPD and DMII  Review of patient status, including review of consultants reports, relevant laboratory and other test results, and collaboration with appropriate care team members and the patient's provider was performed as part of comprehensive patient evaluation and provision of chronic care management services.    SDOH (Social Determinants of Health) assessments performed: No See Care Plan activities for detailed interventions related to SDOH)     Medications: Outpatient Encounter Medications as of 02/28/2020  Medication Sig  . albuterol (PROAIR HFA) 108 (90 Base) MCG/ACT inhaler INHALE 2 PUFFS BY MOUTH EVERY 6 HOURS AS NEEDED FOR WHEEZING  . aspirin EC 81 MG tablet Take 81 mg by mouth daily.  Marland Kitchen atenolol (TENORMIN) 100 MG tablet Take 1 tablet (100 mg total) by mouth daily.  . baclofen (LIORESAL) 10 MG tablet TAKE 1 TABLET BY MOUTH THREE TIMES A DAY AS NEEDED FOR MUSCLE SPASMS  . Biotin 5 MG TABS Take 5 mg by mouth daily.  Marland Kitchen buPROPion (WELLBUTRIN XL) 300 MG 24 hr tablet Take 1 tablet (300 mg total) by mouth daily.  . cholecalciferol (VITAMIN D3) 25 MCG (1000 UT) tablet Take 1 tablet (1,000 Units total) by mouth daily.  Marland Kitchen glucose blood (ONETOUCH VERIO) test strip 1 each by Other route as needed for other. Use as instructed. Tests 3-4 times daily.  . hydrochlorothiazide (HYDRODIURIL) 25 MG tablet Take 1 tablet (25 mg total) by mouth daily.  . hydrOXYzine (VISTARIL) 25 MG capsule TAKE 2 CAPSULES BY MOUTH DAILY  . Insulin Degludec  (TRESIBA FLEXTOUCH) 200 UNIT/ML SOPN Inject 70 Units into the skin daily.  . insulin lispro (HUMALOG KWIKPEN) 100 UNIT/ML KwikPen If blood sugar is < 175 Do not take any correction insulin. 176-225 inject 2 units. 226-275 inject 4 units. 517-001 inject 6 units. 326-375 inject 8 units. 376-425 inject 10 units. 426-475 inject 12 units. 749-449 inject 14 units and call office for further instructions. (Patient taking differently: If blood sugar is < 100 inject 5 units before meals; If blood sugar is 101-175  inject 10 units before meals 176-225 inject 12 units; 226-275 inject 14 units ;276-325 inject 16 units ;326-375 inject 18 units ;376-425 inject 20 units ;426-475 inject 22 units ;  476-525 inject 24 units and call office for further instructions)  . Insulin Pen Needle 32G X 4 MM MISC Use to inject insulin 4 times a day and semaglutide once weekly  . Insulin Syringe-Needle U-100 31G X 15/64" 0.3 ML MISC Use with Humalog to correct blood sugar before meals three times a day  . metFORMIN (GLUCOPHAGE-XR) 500 MG 24 hr tablet 1 tablet daily with breakfast  . mirabegron ER (MYRBETRIQ) 50 MG TB24 tablet Take 1 tablet (50 mg total) by mouth daily.  . Multiple Vitamin (MULTIVITAMIN WITH MINERALS) TABS tablet Take 1 tablet by mouth daily.  . Omega-3 Fatty Acids (FISH OIL) 1000 MG CAPS Take 1 capsule (1,000 mg total) by mouth daily.  Marland Kitchen omeprazole (PRILOSEC) 20 MG capsule Take 1 capsule (20 mg total) by mouth 2 (two) times daily  before a meal.  . oxyCODONE-acetaminophen (PERCOCET/ROXICET) 5-325 MG tablet Take 1 tablet by mouth every 8 (eight) hours as needed. May take a 4th pill if severe pain (up to 10 days monthly) #100 equals 30 day supply  . pregabalin (LYRICA) 200 MG capsule Take 1 capsule (200 mg total) by mouth 3 (three) times daily.  . rosuvastatin (CRESTOR) 20 MG tablet Take 1 tablet (20 mg total) by mouth at bedtime.  . Semaglutide,0.25 or 0.5MG/DOS, (OZEMPIC, 0.25 OR 0.5 MG/DOSE,) 2 MG/1.5ML SOPN Inject  0.5 mg into the skin once a week.  . traZODone (DESYREL) 100 MG tablet Take 1 tablet (100 mg total) by mouth at bedtime as needed for sleep.  . vitamin B-12 (CYANOCOBALAMIN) 500 MCG tablet Take 500 mcg by mouth daily.  . vitamin C (ASCORBIC ACID) 500 MG tablet Take 500 mg by mouth daily.  Marland Kitchen albuterol (PROVENTIL) (2.5 MG/3ML) 0.083% nebulizer solution Take 3 mLs (2.5 mg total) by nebulization every 6 (six) hours as needed for wheezing or shortness of breath. (Patient not taking: Reported on 02/28/2020)  . amLODipine (NORVASC) 10 MG tablet Take 1 tablet (10 mg total) by mouth daily.  . benazepril (LOTENSIN) 40 MG tablet Take 1 tablet (40 mg total) by mouth daily.  . benzonatate (TESSALON PERLES) 100 MG capsule Take 1 capsule (100 mg total) by mouth at bedtime as needed for cough. (Patient not taking: Reported on 02/28/2020)  . fluticasone (FLONASE) 50 MCG/ACT nasal spray INSTILL 1 SPRAY IN EACH NOSTRIL DAILY (Patient not taking: Reported on 02/28/2020)  . ipratropium (ATROVENT) 0.02 % nebulizer solution USE 1 VIAL VIA NEBULIZER EVERY 6 HOURS AS NEEDED FOR WHEEZING (Patient not taking: Reported on 02/28/2020)  . ondansetron (ZOFRAN) 4 MG tablet Take 1 tablet (4 mg total) by mouth every 6 (six) hours. (Patient not taking: Reported on 02/28/2020)  . promethazine (PHENERGAN) 25 MG tablet Take 1 tablet (25 mg total) by mouth every 6 (six) hours as needed for nausea or vomiting. (Patient not taking: Reported on 02/28/2020)  . umeclidinium-vilanterol (ANORO ELLIPTA) 62.5-25 MCG/INH AEPB Inhale 1 puff into the lungs daily.  . [DISCONTINUED] Potassium Chloride ER 20 MEQ TBCR Take 40 mEq by mouth daily.   No facility-administered encounter medications on file as of 02/28/2020.     Objective:  Lab Results  Component Value Date   HGBA1C 9.3 (H) 11/11/2019   HGBA1C 8.9 (A) 08/18/2019   HGBA1C 9.8 (A) 05/18/2019   Lab Results  Component Value Date   MICROALBUR 56.5 (H) 10/05/2014- urine for protein overdue;  consider  SGLT2 i for cardiorenal protection and to improve glycemic control as GFR is adequate   LDLCALC 77 06/05/2016   CREATININE 0.88 11/11/2019   BP Readings from Last 3 Encounters:  02/16/20 120/79  02/03/20 (!) 158/81  02/02/20 102/75      Goals Addressed            This Visit's Progress     Patient Stated   . " I've had diabetes since 1991 and I see [name] Plyler on a regular basis; she helps me manage my diabetes" (pt-stated)       [place] (see longitudinal plan of care for additional care plan information).  Current Barriers:  . Chronic Disease Management support, education, and care coordination needs related to CHF, HTN, COPD, and DMII and rheumatoid arthritis- patient aware that Hgb A1C is not meeting target, states she does not check blood sugar 4 x a day as despite sliding scale Rx  for mealtime coverage, relates she has worn Freestyle Libre Pro in the past, say she needs to find a new eye MD as her yearly eye exam is overdue and she cannot see her established eye MD due to bad debt, wonder if a rheumatologist could help with her arthritis pain since she has diagnosis or rheumatoid arthritis- says she does not need a referral from primary care provider, voices fear of using prn O2 while using space heater, states she does not smoke when she is using O2 and only uses O2 at night and prn and has not used in several weeks, denies issues with her heart in "many years"   Case Manager Clinical Goal(s):  . Over the next 30 days, patient will work with BSW to address needs related to  securing power wheelchair  for this patient with CHF, HTN, COPD, and DMII and rheumatoid arthritis . Patient will call [employer] Medicare customer service for in network eye MD, rheumatologist, criteria for Freestyle Libre personal system and to assess status of power w/c (while SW BSW on line with her) . Patient will check blood sugar prior to two meals and follow sliding scale Rx to improve blood sugar  control and to assist with meeting criteria for Freestyle Libre  Interventions:  . Collaborated with BSW to initiate plan of care to address needs related to for  patient with CHF, HTN, COPD, and DMII and rheumatoid arthritis  Patient Self Care Activities:  . Patient verbalizes understanding of plan to speak with [employer] Medicare customer service to address barriers to obtaining power w/c , to secure in network providers and determine criteria for Freestyle libre  Initial goal documentation        Ms. Espiritu was given information about Chronic Care Management services today including:  1. CCM service includes personalized support from designated clinical staff supervised by her physician, including individualized plan of care and coordination with other care providers 2. 24/7 contact phone numbers for assistance for urgent and routine care needs. 3. Service will only be billed when office clinical staff spend 20 minutes or more in a month to coordinate care. 4. Only one practitioner may furnish and bill the service in a calendar month. 5. The patient may stop CCM services at any time (effective at the end of the month) by phone call to the office staff. 6. The patient will be responsible for cost sharing (co-pay) of up to 20% of the service fee (after annual deductible is met).  Patient agreed to services and verbal consent obtained.   Plan:   The care management team will reach out to the patient again over the next 30 days.     RN, CCM, [facility] RN Care Manager [phone]   

## 2020-02-28 NOTE — Patient Instructions (Signed)
Visit Information  Goals Addressed            This Visit's Progress     Patient Stated   . " I've had diabetes since 1991 and I see Jennifer [name] on a regular basis; she helps me manage my diabetes" (pt-stated)       [place] (see longitudinal plan of care for additional care plan information).  Current Barriers:  . Chronic Disease Management support, education, and care coordination needs related to CHF, HTN, COPD, and DMII and rheumatoid arthritis- patient aware that Hgb A1C is not meeting target, states she does not check blood sugar 4 x a day as despite sliding scale Rx for mealtime coverage, relates she has worn Freestyle Libre Pro in the past, say she needs to find a new eye MD as her yearly eye exam is overdue and she cannot see her established eye MD due to bad debt, wonder if a rheumatologist could help with her arthritis pain since she has diagnosis or rheumatoid arthritis- says she does not need a referral from primary care provider, voices fear of using prn O2 while using space heater, states she does not smoke when she is using O2 and only uses O2 at night and prn and has not used in several weeks, denies issues with her heart in "many years"   Case Manager Clinical Goal(s):  Marland Kitchen Over the next 30 days, patient will work with BSW to address needs related to  securing power wheelchair  for this patient with CHF, HTN, COPD, and DMII and rheumatoid arthritis . Patient will call [facility] Medicare customer service for in network eye MD, rheumatologist, criteria for [facility] personal system and to assess status of power w/c (while SW BSW on line with her) . Patient will check blood sugar prior to two meals and follow sliding scale Rx to improve blood sugar control and to assist with meeting criteria for Freestyle Libre  Interventions:  . Collaborated with BSW to initiate plan of care to address needs related to for  patient with CHF, HTN, COPD, and DMII and rheumatoid  arthritis  Patient Self Care Activities:  . Patient verbalizes understanding of plan to speak with [facility] Medicare customer service to address barriers to obtaining power w/c , to secure in network providers and determine criteria for Freestyle libre  Initial goal documentation        [name] was given information about Chronic Care Management services today including:  1. CCM service includes personalized support from designated clinical staff supervised by her physician, including individualized plan of care and coordination with other care providers 2. 24/7 contact phone numbers for assistance for urgent and routine care needs. 3. Service will only be billed when office clinical staff spend 20 minutes or more in a month to coordinate care. 4. Only one practitioner may furnish and bill the service in a calendar month. 5. The patient may stop CCM services at any time (effective at the end of the month) by phone call to the office staff. 6. The patient will be responsible for cost sharing (co-pay) of up to 20% of the service fee (after annual deductible is met).  Patient agreed to services and verbal consent obtained.   The patient verbalized understanding of instructions provided today and declined a print copy of patient instruction materials.   The care management team will reach out to the patient again over the next 30 days.   Kelli Churn RN, CCM, [facility]  RN Care Manager [phone]

## 2020-02-28 NOTE — Addendum Note (Signed)
Addended by: Resa Miner on: 02/28/2020 05:06 PM   Modules accepted: Orders

## 2020-02-28 NOTE — Patient Instructions (Signed)
Visit Information  Goals Addressed            This Visit's Progress   . "I need a power wheelchair" (pt-stated)       [name] (see longtitudinal plan of care for additional care plan information)  Current Barriers:  . ADL IADL limitations  Clinical Social Work Clinical Goal(s):  Marland Kitchen Over the next 30 days, patient will work with SW to address concerns related to need for DME; power wheelchair  Interventions: . Discussed plans with patient for ongoing care management follow up and provided patient with direct contact information for care management team . Collaborated with [employer] and Grievance Department re: status of appeal for denial of power wheelchair authorization.   . Informed patient that appeal is still under review; Due date for completion is 03/21/20.   Patient Self Care Activities:  . Performs ADL's independently . Unable to perform IADLs independently  Initial goal documentation        Jennifer [name] was given information about Care Management services today including:  1. Care Management services include personalized support from designated clinical staff supervised by her physician, including individualized plan of care and coordination with other care providers 2. 24/7 contact phone numbers for assistance for urgent and routine care needs. 3. The patient may stop CCM services at any time (effective at the end of the month) by phone call to the office staff.  Patient agreed to services and verbal consent obtained.   The patient verbalized understanding of instructions provided today and declined a print copy of patient instruction materials.   Telephone follow up appointment with care management team member scheduled for:03/06/20   Total time spent performing care coordination and/or care management activities with the patient by phone or face to face 60 minutes.   Jennifer [name], [place] Coordination Social Worker [place] [phone]

## 2020-02-28 NOTE — Chronic Care Management (AMB) (Signed)
Chronic Care Management    Clinical Social Work General Note  02/28/2020 Name: Jennifer [name] MRN: [id number] DOB: 16-Jun-1961  Jennifer [name] is a 59 y.o. year old female who is a primary care patient of Bartholomew Crews, MD. The CCM was consulted to assist the patient with DME and housing.   [name] was given information about Chronic Care Management services today including:  1. CCM service includes personalized support from designated clinical staff supervised by her physician, including individualized plan of care and coordination with other care providers 2. 24/7 contact phone numbers for assistance for urgent and routine care needs. 3. Service will only be billed when office clinical staff spend 20 minutes or more in a month to coordinate care. 4. Only one practitioner may furnish and bill the service in a calendar month. 5. The patient may stop CCM services at any time (effective at the end of the month) by phone call to the office staff. 6. The patient will be responsible for cost sharing (co-pay) of up to 20% of the service fee (after annual deductible is met).  Patient agreed to services and verbal consent obtained.   Review of patient status, including review of consultants reports, relevant laboratory and other test results, and collaboration with appropriate care team members and the patient's provider was performed as part of comprehensive patient evaluation and provision of chronic care management services.    SDOH (Social Determinants of Health) assessments and interventions performed:  No   Patient unable to freely discuss housing needs today; requested face to face visit to discuss.     Outpatient Encounter Medications as of 02/28/2020  Medication Sig  . albuterol (PROAIR HFA) 108 (90 Base) MCG/ACT inhaler INHALE 2 PUFFS BY MOUTH EVERY 6 HOURS AS NEEDED FOR WHEEZING  . albuterol (PROVENTIL) (2.5 MG/3ML) 0.083% nebulizer solution Take 3 mLs (2.5 mg total) by  nebulization every 6 (six) hours as needed for wheezing or shortness of breath. (Patient not taking: Reported on 02/28/2020)  . amLODipine (NORVASC) 10 MG tablet Take 1 tablet (10 mg total) by mouth daily.  Marland Kitchen aspirin EC 81 MG tablet Take 81 mg by mouth daily.  Marland Kitchen atenolol (TENORMIN) 100 MG tablet Take 1 tablet (100 mg total) by mouth daily.  . baclofen (LIORESAL) 10 MG tablet TAKE 1 TABLET BY MOUTH THREE TIMES A DAY AS NEEDED FOR MUSCLE SPASMS  . benazepril (LOTENSIN) 40 MG tablet Take 1 tablet (40 mg total) by mouth daily.  . benzonatate (TESSALON PERLES) 100 MG capsule Take 1 capsule (100 mg total) by mouth at bedtime as needed for cough. (Patient not taking: Reported on 02/28/2020)  . Biotin 5 MG TABS Take 5 mg by mouth daily.  Marland Kitchen buPROPion (WELLBUTRIN XL) 300 MG 24 hr tablet Take 1 tablet (300 mg total) by mouth daily.  . cholecalciferol (VITAMIN D3) 25 MCG (1000 UT) tablet Take 1 tablet (1,000 Units total) by mouth daily.  . fluticasone (FLONASE) 50 MCG/ACT nasal spray INSTILL 1 SPRAY IN EACH NOSTRIL DAILY (Patient not taking: Reported on 02/28/2020)  . glucose blood (ONETOUCH VERIO) test strip 1 each by Other route as needed for other. Use as instructed. Tests 3-4 times daily.  . hydrochlorothiazide (HYDRODIURIL) 25 MG tablet Take 1 tablet (25 mg total) by mouth daily.  . hydrOXYzine (VISTARIL) 25 MG capsule TAKE 2 CAPSULES BY MOUTH DAILY  . Insulin Degludec (TRESIBA FLEXTOUCH) 200 UNIT/ML SOPN Inject 70 Units into the skin daily.  . insulin lispro (HUMALOG KWIKPEN)  100 UNIT/ML KwikPen If blood sugar is < 175 Do not take any correction insulin. 176-225 inject 2 units. 226-275 inject 4 units. 188-416 inject 6 units. 326-375 inject 8 units. 376-425 inject 10 units. 426-475 inject 12 units. 606-301 inject 14 units and call office for further instructions. (Patient taking differently: If blood sugar is < 100 inject 5 units before meals; If blood sugar is 101-175  inject 10 units before meals 176-225 inject  12 units; 226-275 inject 14 units ;276-325 inject 16 units ;326-375 inject 18 units ;376-425 inject 20 units ;426-475 inject 22 units ;  476-525 inject 24 units and call office for further instructions)  . Insulin Pen Needle 32G X 4 MM MISC Use to inject insulin 4 times a day and semaglutide once weekly  . Insulin Syringe-Needle U-100 31G X 15/64" 0.3 ML MISC Use with Humalog to correct blood sugar before meals three times a day  . ipratropium (ATROVENT) 0.02 % nebulizer solution USE 1 VIAL VIA NEBULIZER EVERY 6 HOURS AS NEEDED FOR WHEEZING (Patient not taking: Reported on 02/28/2020)  . metFORMIN (GLUCOPHAGE-XR) 500 MG 24 hr tablet 1 tablet daily with breakfast  . mirabegron ER (MYRBETRIQ) 50 MG TB24 tablet Take 1 tablet (50 mg total) by mouth daily.  . Multiple Vitamin (MULTIVITAMIN WITH MINERALS) TABS tablet Take 1 tablet by mouth daily.  . Omega-3 Fatty Acids (FISH OIL) 1000 MG CAPS Take 1 capsule (1,000 mg total) by mouth daily.  Marland Kitchen omeprazole (PRILOSEC) 20 MG capsule Take 1 capsule (20 mg total) by mouth 2 (two) times daily before a meal.  . ondansetron (ZOFRAN) 4 MG tablet Take 1 tablet (4 mg total) by mouth every 6 (six) hours. (Patient not taking: Reported on 02/28/2020)  . oxyCODONE-acetaminophen (PERCOCET/ROXICET) 5-325 MG tablet Take 1 tablet by mouth every 8 (eight) hours as needed. May take a 4th pill if severe pain (up to 10 days monthly) #100 equals 30 day supply  . pregabalin (LYRICA) 200 MG capsule Take 1 capsule (200 mg total) by mouth 3 (three) times daily.  . promethazine (PHENERGAN) 25 MG tablet Take 1 tablet (25 mg total) by mouth every 6 (six) hours as needed for nausea or vomiting. (Patient not taking: Reported on 02/28/2020)  . rosuvastatin (CRESTOR) 20 MG tablet Take 1 tablet (20 mg total) by mouth at bedtime.  . Semaglutide,0.25 or 0.5MG/DOS, (OZEMPIC, 0.25 OR 0.5 MG/DOSE,) 2 MG/1.5ML SOPN Inject 0.5 mg into the skin once a week.  . traZODone (DESYREL) 100 MG tablet Take 1 tablet  (100 mg total) by mouth at bedtime as needed for sleep.  Marland Kitchen umeclidinium-vilanterol (ANORO ELLIPTA) 62.5-25 MCG/INH AEPB Inhale 1 puff into the lungs daily.  . vitamin B-12 (CYANOCOBALAMIN) 500 MCG tablet Take 500 mcg by mouth daily.  . vitamin C (ASCORBIC ACID) 500 MG tablet Take 500 mg by mouth daily.  . [DISCONTINUED] Potassium Chloride ER 20 MEQ TBCR Take 40 mEq by mouth daily.   No facility-administered encounter medications on file as of 02/28/2020.    Goals Addressed            This Visit's Progress   . "I need a power wheelchair" (pt-stated)       [place] (see longtitudinal plan of care for additional care plan information)  Current Barriers:  . ADL IADL limitations  Clinical Social Work Clinical Goal(s):  Marland Kitchen Over the next 30 days, patient will work with SW to address concerns related to need for DME; power wheelchair  Interventions: . Discussed  plans with patient for ongoing care management follow up and provided patient with direct contact information for care management team . Collaborated with [employer] and Grievance Department re: status of appeal for denial of power wheelchair authorization.   . Informed patient that appeal is still under review; Due date for completion is 03/21/20.   Patient Self Care Activities:  . Performs ADL's independently . Unable to perform IADLs independently  Initial goal documentation         Follow Up Plan: Client will call back when ready to schedule face to face visit.  BSW will follow up next week if no return call from patient         Total time spent performing care coordination and/or care management activities with the patient by phone or face to face 60 minutes.   Ronn Melena, [place] Coordination Social Worker [place] [phone]

## 2020-03-02 ENCOUNTER — Ambulatory Visit: Payer: Medicare Other | Admitting: *Deleted

## 2020-03-02 DIAGNOSIS — J449 Chronic obstructive pulmonary disease, unspecified: Secondary | ICD-10-CM

## 2020-03-02 DIAGNOSIS — E1151 Type 2 diabetes mellitus with diabetic peripheral angiopathy without gangrene: Secondary | ICD-10-CM

## 2020-03-02 DIAGNOSIS — IMO0002 Reserved for concepts with insufficient information to code with codable children: Secondary | ICD-10-CM

## 2020-03-02 NOTE — Chronic Care Management (AMB) (Signed)
Chronic Care Management   Follow Up Note   03/02/2020 Name: Jennifer [name] MRN: [id number] DOB: 26-Sep-1961  Referred by: Bartholomew Crews, MD Reason for referral : Chronic Care Management (f/u IDDM, COPD)   Jennifer [name] is a 59 y.o. year old female who is a primary care patient of Bartholomew Crews, MD. The CCM team was consulted for assistance with chronic disease management and care coordination needs.    Review of patient status, including review of consultants reports, relevant laboratory and other test results, and collaboration with appropriate care team members and the patient's provider was performed as part of comprehensive patient evaluation and provision of chronic care management services.    SDOH (Social Determinants of Health) assessments performed: Yes See Care Plan activities for detailed interventions related to SDOH)  SDOH Interventions     Most Recent Value  SDOH Interventions  SDOH Interventions for the Following Domains  Tobacco  Tobacco Interventions  Patient Refused [02/28/11 states she is not interested in smoking cessation, states "that's my only vice and I'm not going to give it up"]       Outpatient Encounter Medications as of 03/02/2020  Medication Sig  . albuterol (PROAIR HFA) 108 (90 Base) MCG/ACT inhaler INHALE 2 PUFFS BY MOUTH EVERY 6 HOURS AS NEEDED FOR WHEEZING  . albuterol (PROVENTIL) (2.5 MG/3ML) 0.083% nebulizer solution Take 3 mLs (2.5 mg total) by nebulization every 6 (six) hours as needed for wheezing or shortness of breath. (Patient not taking: Reported on 02/28/2020)  . amLODipine (NORVASC) 10 MG tablet Take 1 tablet (10 mg total) by mouth daily.  Marland Kitchen aspirin EC 81 MG tablet Take 81 mg by mouth daily.  Marland Kitchen atenolol (TENORMIN) 100 MG tablet Take 1 tablet (100 mg total) by mouth daily.  . baclofen (LIORESAL) 10 MG tablet TAKE 1 TABLET BY MOUTH THREE TIMES A DAY AS NEEDED FOR MUSCLE SPASMS  . benazepril (LOTENSIN) 40 MG tablet Take 1 tablet  (40 mg total) by mouth daily.  . benzonatate (TESSALON PERLES) 100 MG capsule Take 1 capsule (100 mg total) by mouth at bedtime as needed for cough. (Patient not taking: Reported on 02/28/2020)  . Biotin 5 MG TABS Take 5 mg by mouth daily.  . Blood Glucose Monitoring Suppl ([place]) w/Device KIT Check 4 times a day  . buPROPion (WELLBUTRIN XL) 300 MG 24 hr tablet Take 1 tablet (300 mg total) by mouth daily.  . cholecalciferol (VITAMIN D3) 25 MCG (1000 UT) tablet Take 1 tablet (1,000 Units total) by mouth daily.  . fluticasone (FLONASE) 50 MCG/ACT nasal spray INSTILL 1 SPRAY IN EACH NOSTRIL DAILY (Patient not taking: Reported on 02/28/2020)  . glucose blood (ONETOUCH VERIO) test strip 1 each by Other route as needed for other. Use as instructed. Tests 3-4 times daily.  . hydrochlorothiazide (HYDRODIURIL) 25 MG tablet Take 1 tablet (25 mg total) by mouth daily.  . hydrOXYzine (VISTARIL) 25 MG capsule TAKE 2 CAPSULES BY MOUTH DAILY  . Insulin Degludec (TRESIBA FLEXTOUCH) 200 UNIT/ML SOPN Inject 70 Units into the skin daily.  . insulin lispro (HUMALOG KWIKPEN) 100 UNIT/ML KwikPen If blood sugar is < 175 Do not take any correction insulin. 176-225 inject 2 units. 226-275 inject 4 units. 195-093 inject 6 units. 326-375 inject 8 units. 376-425 inject 10 units. 426-475 inject 12 units. 267-124 inject 14 units and call office for further instructions. (Patient taking differently: If blood sugar is < 100 inject 5 units before meals; If blood  sugar is 101-175  inject 10 units before meals 176-225 inject 12 units; 226-275 inject 14 units ;276-325 inject 16 units ;326-375 inject 18 units ;376-425 inject 20 units ;426-475 inject 22 units ;  476-525 inject 24 units and call office for further instructions)  . Insulin Pen Needle 32G X 4 MM MISC Use to inject insulin 4 times a day and semaglutide once weekly  . Insulin Syringe-Needle U-100 31G X 15/64" 0.3 ML MISC Use with Humalog to correct blood sugar  before meals three times a day  . ipratropium (ATROVENT) 0.02 % nebulizer solution USE 1 VIAL VIA NEBULIZER EVERY 6 HOURS AS NEEDED FOR WHEEZING (Patient not taking: Reported on 02/28/2020)  . metFORMIN (GLUCOPHAGE-XR) 500 MG 24 hr tablet 1 tablet daily with breakfast  . mirabegron ER (MYRBETRIQ) 50 MG TB24 tablet Take 1 tablet (50 mg total) by mouth daily.  . Multiple Vitamin (MULTIVITAMIN WITH MINERALS) TABS tablet Take 1 tablet by mouth daily.  . Omega-3 Fatty Acids (FISH OIL) 1000 MG CAPS Take 1 capsule (1,000 mg total) by mouth daily.  Marland Kitchen omeprazole (PRILOSEC) 20 MG capsule Take 1 capsule (20 mg total) by mouth 2 (two) times daily before a meal.  . ondansetron (ZOFRAN) 4 MG tablet Take 1 tablet (4 mg total) by mouth every 6 (six) hours. (Patient not taking: Reported on 02/28/2020)  . oxyCODONE-acetaminophen (PERCOCET/ROXICET) 5-325 MG tablet Take 1 tablet by mouth every 8 (eight) hours as needed. May take a 4th pill if severe pain (up to 10 days monthly) #100 equals 30 day supply  . pregabalin (LYRICA) 200 MG capsule Take 1 capsule (200 mg total) by mouth 3 (three) times daily.  . promethazine (PHENERGAN) 25 MG tablet Take 1 tablet (25 mg total) by mouth every 6 (six) hours as needed for nausea or vomiting. (Patient not taking: Reported on 02/28/2020)  . rosuvastatin (CRESTOR) 20 MG tablet Take 1 tablet (20 mg total) by mouth at bedtime.  . Semaglutide,0.25 or 0.5MG/DOS, (OZEMPIC, 0.25 OR 0.5 MG/DOSE,) 2 MG/1.5ML SOPN Inject 0.5 mg into the skin once a week.  . traZODone (DESYREL) 100 MG tablet Take 1 tablet (100 mg total) by mouth at bedtime as needed for sleep.  Marland Kitchen umeclidinium-vilanterol (ANORO ELLIPTA) 62.5-25 MCG/INH AEPB Inhale 1 puff into the lungs daily.  . vitamin B-12 (CYANOCOBALAMIN) 500 MCG tablet Take 500 mcg by mouth daily.  . vitamin C (ASCORBIC ACID) 500 MG tablet Take 500 mg by mouth daily.  . [DISCONTINUED] Potassium Chloride ER 20 MEQ TBCR Take 40 mEq by mouth daily.   No  facility-administered encounter medications on file as of 03/02/2020.     Objective:   Goals Addressed            This Visit's Progress     Patient Stated   . " I've had diabetes since 1991 and I see [name] Plyler on a regular basis; she helps me manage my diabetes" (pt-stated)       [place] (see longitudinal plan of care for additional care plan information).  Current Barriers:  . Chronic Disease Management support, education, and care coordination needs related to CHF, HTN, COPD, and DMII and rheumatoid arthritis- patient aware that Hgb A1C is not meeting target, states she does not check blood sugar 4 x a day as despite sliding scale Rx for mealtime coverage, relates she has worn Freestyle Libre Pro in the past, say she needs to find a new eye MD as her yearly eye exam is overdue and  she cannot see her established eye MD due to bad debt, wonder if a rheumatologist could help with her arthritis pain since she has diagnosis or rheumatoid arthritis- says she does not need a referral from primary care provider, voices fear of using prn O2 while using space heater, states she does not smoke when she is using O2 and only uses O2 at night and prn and has not used in several weeks, denies issues with her heart in "many years"        03/02/20 Telephone call to patient to follow up on issues discussed on 3/9. Patient states she has not called [facility] customer service for in network ophthalmologist and rheumatologist and criteria to qualify for the [facility] , has been too busy with birthday plans.   Case Manager Clinical Goal(s):  Marland Kitchen Over the next 30 days, patient will work with BSW to address needs related to  securing power wheelchair  for this patient with CHF, HTN, COPD, and DMII and rheumatoid arthritis . Patient will call [facility] Medicare customer service for in network eye MD, rheumatologist, criteria for [facility] personal system and to assess status of power w/c (while SW BSW on line  with her) . Patient will check blood sugar prior to two meals and follow sliding scale Rx to improve blood sugar control and to assist with meeting criteria for Freestyle Libre  Interventions:  . Collaborated with BSW to initiate plan of care to address needs related to for  patient with CHF, HTN, COPD, and DMII and rheumatoid arthritis.   . 03/02/20 Wished patient happy birthday . 03/02/20 Advised patient that space heater is OK for use with home O2 as long as the heater is at least 6 feet from the oxygen source. Mailed Emmi educational article on Oxygen Therapy and Oxygen Safety in the Home from [employer].  Patient Self Care Activities:  . Patient verbalizes understanding of plan to speak with [facility] Medicare customer service to address barriers to obtaining power w/c , to secure in network providers and determine criteria for [employer]  Initial goal documentation       Other   . HEMOGLOBIN A1C < 7.0       Most recent Hgb A1C = 8.5% on 11/10/20 Reviewed diabetes self management actions reviewed:  Glucose monitoring per provider recommendations  Take medications as directed  Perform Quality checks on blood meter when suspicious of readings  Eat Healthy  Check feet daily  Visit provider every 3-6 months as directed  Hbg A1C level every 3-6 months.  Eye Exam yearly  Yearly Foot exam    . Quit smoking / using tobacco   Not on track    Patient states "smoking is my only vice and I'm not going to give it up"        Plan:   The care management team will reach out to the patient again over the next 30 days.    Kelli Churn RN, CCM, [facility] RN Care Manager [phone]

## 2020-03-02 NOTE — Patient Instructions (Addendum)
Visit Information  Happy Rudene Anda!! You have an appointment with the plastic surgeon on Monday 03/05/20 at 1:15 pm.  Goals Addressed            This Visit's Progress     Patient Stated   . " I've had diabetes since 1991 and I see [name] Plyler on a regular basis; she helps me manage my diabetes" (pt-stated)       [place] (see longitudinal plan of care for additional care plan information).  Current Barriers:  . Chronic Disease Management support, education, and care coordination needs related to CHF, HTN, COPD, and DMII and rheumatoid arthritis- patient aware that Hgb A1C is not meeting target, states she does not check blood sugar 4 x a day as despite sliding scale Rx for mealtime coverage, relates she has worn Freestyle Libre Pro in the past, say she needs to find a new eye MD as her yearly eye exam is overdue and she cannot see her established eye MD due to bad debt, wonder if a rheumatologist could help with her arthritis pain since she has diagnosis or rheumatoid arthritis- says she does not need a referral from primary care provider, voices fear of using prn O2 while using space heater, states she does not smoke when she is using O2 and only uses O2 at night and prn and has not used in several weeks, denies issues with her heart in "many years"        03/02/20 Telephone call to patient to follow up on issues discussed on 3/9. Patient states she has not called [facility] customer service for in network ophthalmologist and rheumatologist and criteria to qualify for the [facility] , has been too busy with birthday plans.   Case Manager Clinical Goal(s):  Marland Kitchen Over the next 30 days, patient will work with BSW to address needs related to  securing power wheelchair  for this patient with CHF, HTN, COPD, and DMII and rheumatoid arthritis . Patient will call [facility] Medicare customer service for in network eye MD, rheumatologist, criteria for [facility] personal system and to assess status of  power w/c (while SW BSW on line with her) . Patient will check blood sugar prior to two meals and follow sliding scale Rx to improve blood sugar control and to assist with meeting criteria for Freestyle Libre  Interventions:  . Collaborated with BSW to initiate plan of care to address needs related to for  patient with CHF, HTN, COPD, and DMII and rheumatoid arthritis.   . 03/02/20 Wished patient happy birthday . 03/02/20 Advised patient that space heater is OK for use with home O2 as long as the heater is at least 6 feet from the oxygen source. Mailed Emmi educational article on Oxygen Therapy and Oxygen Safety in the Home from [employer].  Patient Self Care Activities:  . Patient verbalizes understanding of plan to speak with [facility] Medicare customer service to address barriers to obtaining power w/c , to secure in network providers and determine criteria for [employer]  Initial goal documentation       Other   . HEMOGLOBIN A1C < 7.0       Most recent Hgb A1C = 8.5% on 11/10/20 Reviewed diabetes self management actions reviewed:  Glucose monitoring per provider recommendations  Take medications as directed  Perform Quality checks on blood meter when suspicious of readings  Eat Healthy  Check feet daily  Visit provider every 3-6 months as directed  Hbg A1C level  every 3-6 months.  Eye Exam yearly  Yearly Foot exam    . Quit smoking / using tobacco   Not on track    Patient states "smoking is my only vice and I'm not going to give it up"       The patient verbalized understanding of instructions provided today and declined a print copy of patient instruction materials.   The care management team will reach out to the patient again over the next 30 days.    Kelli Churn RN, CCM, [facility] RN Care Manager [phone]

## 2020-03-04 ENCOUNTER — Encounter: Payer: Self-pay | Admitting: *Deleted

## 2020-03-05 ENCOUNTER — Ambulatory Visit (INDEPENDENT_AMBULATORY_CARE_PROVIDER_SITE_OTHER): Payer: Medicare Other | Admitting: Physical Therapy

## 2020-03-05 ENCOUNTER — Other Ambulatory Visit: Payer: Self-pay

## 2020-03-05 ENCOUNTER — Encounter: Payer: Self-pay | Admitting: Physical Therapy

## 2020-03-05 DIAGNOSIS — M25511 Pain in right shoulder: Secondary | ICD-10-CM | POA: Diagnosis not present

## 2020-03-05 DIAGNOSIS — M25611 Stiffness of right shoulder, not elsewhere classified: Secondary | ICD-10-CM

## 2020-03-05 DIAGNOSIS — M6281 Muscle weakness (generalized): Secondary | ICD-10-CM

## 2020-03-05 DIAGNOSIS — R293 Abnormal posture: Secondary | ICD-10-CM | POA: Diagnosis not present

## 2020-03-05 NOTE — Patient Instructions (Signed)
Access Code: [email or web address] URL: [email or web address] Date: 03/05/2020 Prepared by: Faustino Congress  Exercises Supine Shoulder External Internal Rotation AAROM with Dowel - 2 x daily - 7 x weekly - 1 sets - 10 reps - 3-5 sec hold Standing Shoulder Extension AAROM with Dowel - 2 x daily - 7 x weekly - 1 sets - 10 reps - 3-5 sec hold Standing Bilateral Shoulder Internal Rotation AAROM with Dowel - 2 x daily - 7 x weekly - 1 sets - 10 reps - 3-5 sec hold Standing Shoulder Internal Rotation AAROM with Dowel - 2 x daily - 7 x weekly - 1 sets - 10 reps - 3-5 sec hold

## 2020-03-05 NOTE — Therapy (Signed)
[facility] Physical Therapy [address] [place], [place], [address] Phone: [phone]   Fax:  [phone]  Physical Therapy Evaluation  Patient Details  Name: Jennifer [name] MRN: [id number] Date of Birth: 1961-05-16 Referring Provider (PT): Suzan Slick, NP   Encounter Date: 03/05/2020  PT End of Session - 03/05/20 1356    Visit Number  1    Number of Visits  12    Date for PT Re-Evaluation  04/16/20    PT Start Time  [id number]    PT Stop Time  1350    PT Time Calculation (min)  38 min    Activity Tolerance  Patient tolerated treatment well    Behavior During Therapy  [facility] for tasks assessed/performed       Past Medical History:  Diagnosis Date  . Anginal pain [facility])    '3' of 10 ischemia ruled out 9/9   . Arthritis of lumbar spine   . Asthma   . Cataract   . CHF (congestive heart failure) ([place])   . 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 Chronic bronchitis ([place])    "I get it alot" (09/28/2013)  . Chronic diastolic heart failure (HCC)    grade 2 per 2D echocardiogram (01/2013)  . Chronic lower back pain   . Chronic osteomyelitis of foot (HCC)    chronic, right secondary to diabetic foot ulcers  . Chronic pain syndrome 12/03/2011   Likely secondary to depression, "fibromyalgia", neuropathy, and obesity. Lumbar MRI 2014 no sig change from prior (2008) : Stable hypertrophic facet disease most notable at L4-5. Stable shallow left foraminal/extraforaminal disc protrusion at L4-5. No direct neural compression.      Marland Kitchen COPD 01/08/2007   PFT's 05/2007 : FEV1/FVC 82, FEV1 64% pred, FEF 25-75% 40% predicted, 16% improvement in FEV1 with bronchodilators.     . Depression   . Diabetes mellitus without complication ([place])    Type II  . Diabetic peripheral neuropathy ([place])   . DVT of upper extremity (deep vein thrombosis) ([place]) 03/11/2013   Secondary to PICC line. Right brachial vein, diagnosed on 03/10/2013 Coumadin for 3 months. End date 06/10/2013   .  Dyspnea    "smoker"  . Environmental allergies    Hx: of  . Exertional shortness of breath   . Fatty liver 2003   observed on ultrasound abdomen  . Fibromyalgia   . GERD (gastroesophageal reflux disease)   . Glaucoma   . History of use of hearing aid   . Hyperlipidemia   . Hyperplastic colon polyp 12/2010   Per colonoscopy (12/2010) - Dr. Deatra Ina  . Hypertension   . 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 Blood cultures 01/26/13 S. Aureus and GBS. Blood cultures Feb 6th, 8th, and 9th and March were negative.Repeat TEE 3/20 negative for vegitations  . 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 2/2 tobacco use, DM, and cont weight bearing on surgical wound and developed gangrene   . Pneumonia   . Polymicrobial bacterial infection 01/2013   GBS and S. aureus bacteremia // Source likely infected diabetic foot ulcer  . PVD (peripheral vascular disease) with claudication ([place])    Stents to bilateral common iliac arteries (left 2005, right 2008), on chronic plavix  . Rheumatoid arthritis ([place])   . S/P BKA (below knee amputation) unilateral [facility])    Oct 2014 L -  failed limb preserving treatment. 2/2 tobacco use, DM, and cont weight bearing on surgical wound and developed gangrene   . Tobacco abuse   . Type II diabetes mellitus with peripheral circulatory disorders, uncontrolled DX: 1993   Insulin dep. Poor control. Complicated by diabetic foot ulcer and diabetic eye disease.    Marland Kitchen Ulcer of foot, chronic (HCC)    Left. No OM per MRI (01/2013)    Past Surgical History:  Procedure Laterality Date  . ABDOMINAL HYSTERECTOMY  1997   secondary to uterine fibroids  . AMPUTATION Left 08/31/2013   Procedure: AMPUTATION RAY;  Surgeon: Newt Minion, MD;  Location: [facility];  Service: Orthopedics;  Laterality: Left;  Left Foot 5th Ray Amputation  . AMPUTATION Left 09/28/2013    Procedure: Left Midfoot amputation;  Surgeon: Newt Minion, MD;  Location: [place];  Service: Orthopedics;  Laterality: Left;  Left Midfoot amputation  . AMPUTATION Left 10/14/2013   Procedure: AMPUTATION BELOW KNEE- left;  Surgeon: Newt Minion, MD;  Location: [facility];  Service: Orthopedics;  Laterality: Left;  Left Below Knee Amputation   . AMPUTATION TOE Right 01/15/2017   Procedure: AMPUTATION 5th TOE RIGHT FOOT;  Surgeon: Edrick Kins, DPM;  Location: [facility];  Service: Podiatry;  Laterality: Right;  . APPLICATION OF WOUND VAC  04/01/2019   Procedure: Application Of Wound Vac;  Surgeon: Newt Minion, MD;  Location: [place];  Service: Orthopedics;;  . BLADDER SURGERY     bladder reconstruction surgery  . BREAST BIOPSY     multiple-benign per pt  . COLONOSCOPY    . ESOPHAGOGASTRODUODENOSCOPY N/A 09/20/2013   Procedure: ESOPHAGOGASTRODUODENOSCOPY (EGD);  Surgeon: Jerene Bears, MD;  Location: [place];  Service: Gastroenterology;  Laterality: N/A;  . FOOT AMPUTATION THROUGH METATARSAL Left 09/28/2013  . GANGLION CYST EXCISION     multiple  . PERIPHERAL VASCULAR INTERVENTION     stents in lower ext  . SHOULDER ARTHROSCOPY Right 11/11/2019   RIGHT SHOULDER ARTHROSCOPY AND DEBRIDEMENT   . SHOULDER ARTHROSCOPY Right 11/11/2019   Procedure: RIGHT SHOULDER ARTHROSCOPY AND DEBRIDEMENT;  Surgeon: Newt Minion, MD;  Location: [place];  Service: Orthopedics;  Laterality: Right;  . SHOULDER ARTHROSCOPY W/ ROTATOR CUFF REPAIR Bilateral   . SKIN SPLIT GRAFT Bilateral 05/13/2013   Procedure: Right and Left Foot Allograft Skin Graft;  Surgeon: Newt Minion, MD;  Location: [place];  Service: Orthopedics;  Laterality: Bilateral;  Right and Left Foot Allograft Skin Graft  . STUMP REVISION Left 04/01/2019   Procedure: REVISION LEFT BELOW KNEE AMPUTATION;  Surgeon: Newt Minion, MD;  Location: [place];  Service: Orthopedics;  Laterality: Left;  . TEE WITHOUT CARDIOVERSION N/A 01/31/2013   Procedure:  TRANSESOPHAGEAL ECHOCARDIOGRAM (TEE);  Surgeon: Fay Records, MD;  Location: [place];  Service: Cardiovascular;  Laterality: N/A;  Rm [phone]  . TEE WITHOUT CARDIOVERSION N/A 03/10/2013   Procedure: TRANSESOPHAGEAL ECHOCARDIOGRAM (TEE);  Surgeon: Larey Dresser, MD;  Location: [place];  Service: Cardiovascular;  Laterality: N/A;  Rm. [phone]  . TOE AMPUTATION Left 08/31/2013   4TH & 5 TH TOE   . TONSILLECTOMY    . TUBAL LIGATION    . WRIST SURGERY Right    "for tumors" (09/28/2013)    There were no vitals filed for this visit.   Subjective Assessment - 03/05/20 1318    Subjective  Pt is a 59 y/o female who presents to OPPT s/p Rt shoulder scope for compete RTC tear on  11/11/19.  Pt d/c to SNF then had HHPT through [facility], and c/o difficulty with reaching arm behind back and behind head.    Pertinent History  Lt BKA, OA, asthma, CHF, chronic pain, depression, DM, PVD, RA, tobacco abuse    Limitations  Lifting    Patient Stated Goals  reach behind back    Currently in Pain?  Yes    Pain Score  6    up to 9/10; at best 6/10   Pain Location  Shoulder    Pain Orientation  Right    Pain Descriptors / Indicators  Throbbing    Pain Type  Chronic pain;Acute pain;Surgical pain    Pain Onset  More than a month ago    Pain Frequency  Constant    Aggravating Factors   lifting arm    Pain Relieving Factors  "I can take an oxy and a lyrica and it will ease it down."         [facility] PT Assessment - 03/05/20 1322      Assessment   Medical Diagnosis  s/p scope for complete rc tear    Referring Provider (PT)  Suzan Slick, NP    Onset Date/Surgical Date  11/11/19    Hand Dominance  Right    Next MD Visit  PRN    Prior Therapy  SNF then HHPT      Precautions   Precautions  Fall      Restrictions   Weight Bearing Restrictions  No      Balance Screen   Has the patient fallen in the past 6 months  Yes    How many times?  ~ 25    Has the patient had a decrease in activity level  because of a fear of falling?   Yes    Is the patient reluctant to leave their home because of a fear of falling?   Yes      [place];Other relatives   son, sister and brother-in-law   Available Help at Discharge  Family;Available 24 hours/day    Type of Home  House    Home Access  Stairs to enter    Entrance Stairs-Number of Steps  5    Entrance Stairs-Rails  Right;Left;Can reach both    Home Layout  One level    Additional Comments  independent with ADLs, walks with rollator      Prior Function   Level of Independence  Independent;Independent with basic ADLs;Independent with household mobility with device    Vocation  On disability    Leisure  nothing      Cognition   Overall Cognitive Status  Within Functional Limits for tasks assessed      Posture/Postural Control   Posture/Postural Control  Postural limitations    Postural Limitations  Rounded Shoulders;Forward head      ROM / Strength   AROM / PROM / Strength  AROM;Strength;PROM      AROM   AROM Assessment Site  Shoulder    Right/Left Shoulder  Right    Right Shoulder Flexion  123 Degrees    Right Shoulder ABduction  108 Degrees    Right Shoulder Internal Rotation  --   to Rt glute/piriformis area   Right Shoulder External Rotation  --   to Rt ear     PROM   PROM Assessment Site  Shoulder    Right/Left Shoulder  Right    Right Shoulder Flexion  134 Degrees    Right Shoulder ABduction  134 Degrees      Strength   Overall Strength Comments  pain with flexion and external rotation    Strength Assessment Site  Shoulder    Right/Left Shoulder  Right    Right Shoulder Flexion  3/5    Right Shoulder ABduction  3+/5    Right Shoulder Internal Rotation  4/5    Right Shoulder External Rotation  3+/5                Objective measurements completed on examination: See above findings.      [place] Adult PT Treatment/Exercise -  03/05/20 1322      Exercises   Exercises  Shoulder      Shoulder Exercises: Seated   Other Seated Exercises  seated AA external rotation with abduction, Rt shoulder extension, IR (2 exercises) with cane x 10 reps each             PT Education - 03/05/20 1356    Education Details  HEP    Person(s) Educated  Patient    Methods  Explanation;Demonstration;Handout    Comprehension  Verbalized understanding;Returned demonstration;Need further instruction          PT Long Term Goals - 03/05/20 1454      PT LONG TERM GOAL #1   Title  independent with HEP    Status  New    Target Date  04/16/20      PT LONG TERM GOAL #2   Title  improve functional external rotation to back of head for improved function    Status  New    Target Date  04/16/20      PT LONG TERM GOAL #3   Title  improve functional internal rotation to at least L2/3 for improved function    Status  New    Target Date  04/16/20      PT LONG TERM GOAL #4   Title  report pain < 4/10 with activity for improved function and decreased pain    Status  New    Target Date  04/16/20      PT LONG TERM GOAL #5   Title  Rt shoulder strength at least 4/5 for improved function    Status  New    Target Date  04/16/20             Plan - 03/05/20 1343    Clinical Impression Statement  Pt is a 59 y/o female who presents to OPPT s/p Rt shoulder scope with rotator cuff debridement on 11/11/19.  Pt has had extensive therapy post-op at SNF then HHPT, and overall is doing fairly well.  She has some mild ROM limitations with external rotation and internal rotation as well as decreased strength.  Pt will benefit from PT to address deficits listed.    Personal Factors and Comorbidities  Age;Comorbidity 3+    Comorbidities  Lt BKA, OA, asthma, CHF, chronic pain, depression, DM, PVD, RA, tobacco abuse    Examination-Activity Limitations  Bathing;Dressing;Hygiene/Grooming;Toileting;Reach Overhead    Examination-Participation  Restrictions  Cleaning    Stability/Clinical Decision Making  Evolving/Moderate complexity    Clinical Decision Making  Moderate    Rehab Potential  Good    PT Frequency  2x / week    PT Duration  6 weeks    PT Treatment/Interventions  ADLs/Self Care Home Management;Cryotherapy;Electrical Stimulation;Ultrasound;Moist Heat;Iontophoresis 4mg /ml Dexamethasone;Functional mobility training;Therapeutic activities;Therapeutic exercise;Patient/family  education;Neuromuscular re-education;Manual techniques;Taping;Vasopneumatic Device;Dry needling;Passive range of motion    PT Next Visit Plan  review HEP, work on functional ir/er, manual/modalities PRN, strengthening    PT Home Exercise Plan  Access Code: [email or web address]    Consulted and Agree with Plan of Care  Patient       Patient will benefit from skilled therapeutic intervention in order to improve the following deficits and impairments:  Pain, Postural dysfunction, Decreased range of motion, Decreased strength, Decreased endurance  Visit Diagnosis: Acute pain of right shoulder - Plan: PT plan of care cert/re-cert  Stiffness of right shoulder, not elsewhere classified - Plan: PT plan of care cert/re-cert  Abnormal posture - Plan: PT plan of care cert/re-cert  Muscle weakness (generalized) - Plan: PT plan of care cert/re-cert     Problem List Patient Active Problem List   Diagnosis Date Noted  . Hallucination, visual 01/17/2020  . Burn 01/17/2020  . Encounter for power mobility device assessment 11/24/2019  . Pain in joint of right shoulder 11/11/2019  . Nontraumatic incomplete tear of right rotator cuff   . CKD (chronic kidney disease) stage 3, GFR 30-59 ml/min 10/18/2019  . 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 Skin rash 06/30/2019  . Impingement syndrome of right shoulder 06/22/2019  . Urinary incontinence 05/13/2018  . Nocturnal hypoxia 06/12/2017  . Morbid obesity with BMI of 40.0-44.9, adult ([place]) 03/26/2017  . Toe amputation status,  right 01/16/2017  . Thrombocytopenia ([place]) 06/05/2016  . 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 Type 2 diabetes with nephropathy ([place]) 09/07/2015  . Uncontrolled diabetes mellitus with retinopathy, due to underlying condition, without macular edema ([place]) 09/05/2015  . Diabetic retinopathy ([place]) 09/05/2015  . Counseling regarding end of life decision making 06/14/2015  . Atherosclerosis of aorta ([place]) 04/04/2015  . Anemia 10/05/2014  . Chronic diastolic heart failure ([place])   . Hx of BKA, left ([place])   . Tobacco abuse   . Severe obesity (BMI >= 40) ([place]) 03/02/2013  . Abnormality of gait 03/01/2013  . Healthcare maintenance 07/10/2012  . Chronic prescription opiate use 12/03/2011  . Secondary diabetes mellitus with peripheral vascular disease ([place]) 08/27/2011  . 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 ([place]) 11/29/2009  . Hypertension associated with diabetes ([place]) 11/29/2009  . Chronic insomnia 10/25/2009  . GASTROESOPHAGEAL REFLUX DISEASE 11/24/2008  . Depression, major, severe recurrence ([place]) 04/06/2008  . DM (diabetes mellitus) type II uncontrolled, periph vascular disorder ([place]) 04/02/2007  . Hyperlipidemia associated with type 2 diabetes mellitus ([place]) 01/08/2007  . COPD exacerbation ([place]) 01/08/2007      Laureen Abrahams, PT, DPT 03/05/20 3:19 PM     [facility] Physical Therapy [address] [place], [place], [address] Phone: [phone]   Fax:  [phone]  Name: Jennifer [name] MRN: [id number] Date of Birth: 1961-06-06

## 2020-03-06 ENCOUNTER — Ambulatory Visit: Payer: Medicare Other

## 2020-03-06 DIAGNOSIS — E1159 Type 2 diabetes mellitus with other circulatory complications: Secondary | ICD-10-CM

## 2020-03-06 DIAGNOSIS — IMO0002 Reserved for concepts with insufficient information to code with codable children: Secondary | ICD-10-CM

## 2020-03-06 DIAGNOSIS — E1151 Type 2 diabetes mellitus with diabetic peripheral angiopathy without gangrene: Secondary | ICD-10-CM

## 2020-03-06 NOTE — Patient Instructions (Signed)
Visit Information  Goals Addressed   None     Patient verbalizes understanding of instructions provided today.   Face to Face appointment with CCM Social Worker scheduled for 03/15/20.   Marland Kitchen   Ronn Melena, [place] Coordination Social Worker [place] [phone]

## 2020-03-06 NOTE — Chronic Care Management (AMB) (Signed)
  Care Management   Follow Up Note   03/06/2020 Name: Jennifer [name] MRN: [id number] DOB: July 10, 1961  Referred by: Bartholomew Crews, MD Reason for referral : Care Coordination (Housing)   Jennifer [name] is a 59 y.o. year old female who is a primary care patient of Bartholomew Crews, MD. The care management team was consulted for assistance with care management and care coordination needs.    Review of patient status, including review of consultants reports, relevant laboratory and other test results, and collaboration with appropriate care team members and the patient's provider was performed as part of comprehensive patient evaluation and provision of chronic care management services.    SDOH (Social Determinants of Health) assessments performed: No See Care Plan activities for detailed interventions related to [facility])     Advanced Directives: See Care Plan and Vynca application for related entries.   Goals Addressed   None      Follow up call to patient today, however, she prefers to discuss housing needs in person.   Face to Face appointment with CCM Social Worker scheduled for 03/15/20.    Ronn Melena, [place] Coordination Social Worker [place] [phone]

## 2020-03-07 ENCOUNTER — Encounter: Payer: Medicare Other | Admitting: Physical Therapy

## 2020-03-07 ENCOUNTER — Telehealth: Payer: Self-pay | Admitting: Physical Therapy

## 2020-03-07 NOTE — Telephone Encounter (Signed)
I called pt after she missed her PT appointment today at 11:45. Pt reported she didn't realize she had a visit scheduled today. Pt was reminded of her appointment next scheduled on Tuesday.  [place] Hard, PT 03/07/20 1:16 PM

## 2020-03-08 ENCOUNTER — Ambulatory Visit: Payer: Medicare Other | Admitting: Surgical

## 2020-03-08 NOTE — Progress Notes (Signed)
Internal Medicine Clinic Resident   I have personally reviewed this encounter including the documentation in this note and/or discussed this patient with the care management provider. I will address any urgent items identified by the care management provider and will communicate my actions to the patient's PCP. I have reviewed the patient's CCM visit with my supervising attending.  Kiah Keay, MD 03/08/2020    

## 2020-03-12 ENCOUNTER — Ambulatory Visit (HOSPITAL_COMMUNITY): Admission: RE | Admit: 2020-03-12 | Payer: Medicare Other | Source: Ambulatory Visit

## 2020-03-12 ENCOUNTER — Telehealth: Payer: Self-pay | Admitting: Internal Medicine

## 2020-03-12 NOTE — Telephone Encounter (Signed)
Changed exp date

## 2020-03-12 NOTE — Telephone Encounter (Signed)
This patient is scheduled for [facility] radiology to[place] @ 5pm but, did not call her transportation in time.  Per radiology Sch Department, please place another order as the order currently placed will expire on 03/18/2020. Their are no openings until After April 1st at [facility] or [facility].

## 2020-03-12 NOTE — Addendum Note (Signed)
Addended by: Larey Dresser A on: 03/12/2020 01:34 PM   Modules accepted: Orders

## 2020-03-12 NOTE — Telephone Encounter (Signed)
Pls contact pt [phone]

## 2020-03-13 ENCOUNTER — Ambulatory Visit (INDEPENDENT_AMBULATORY_CARE_PROVIDER_SITE_OTHER): Payer: Medicare Other | Admitting: Physical Therapy

## 2020-03-13 ENCOUNTER — Other Ambulatory Visit: Payer: Self-pay

## 2020-03-13 DIAGNOSIS — M25611 Stiffness of right shoulder, not elsewhere classified: Secondary | ICD-10-CM

## 2020-03-13 DIAGNOSIS — M25511 Pain in right shoulder: Secondary | ICD-10-CM

## 2020-03-13 DIAGNOSIS — R293 Abnormal posture: Secondary | ICD-10-CM | POA: Diagnosis not present

## 2020-03-13 DIAGNOSIS — M6281 Muscle weakness (generalized): Secondary | ICD-10-CM | POA: Diagnosis not present

## 2020-03-13 NOTE — Therapy (Signed)
[facility] [address] [place], [place], [address] Phone: [phone]   Fax:  [phone]  Physical Therapy Treatment  Patient Details  Name: Jennifer [name] MRN: [id number] Date of Birth: May 21, 1961 Referring Provider (PT): Suzan Slick, NP   Encounter Date: 03/13/2020  PT End of Session - 03/13/20 1210    Visit Number  2    Number of Visits  12    Date for PT Re-Evaluation  04/16/20    PT Start Time  1205    PT Stop Time  1230    PT Time Calculation (min)  25 min    Activity Tolerance  Patient tolerated treatment well    Behavior During Therapy  [facility] for tasks assessed/performed       Past Medical History:  Diagnosis Date  . Anginal pain [facility])    '3' of 10 ischemia ruled out 9/9   . Arthritis of lumbar spine   . Asthma   . Cataract   . CHF (congestive heart failure) ([place])   . 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 Chronic bronchitis ([place])    "I get it alot" (09/28/2013)  . Chronic diastolic heart failure (HCC)    grade 2 per 2D echocardiogram (01/2013)  . Chronic lower back pain   . Chronic osteomyelitis of foot (HCC)    chronic, right secondary to diabetic foot ulcers  . Chronic pain syndrome 12/03/2011   Likely secondary to depression, "fibromyalgia", neuropathy, and obesity. Lumbar MRI 2014 no sig change from prior (2008) : Stable hypertrophic facet disease most notable at L4-5. Stable shallow left foraminal/extraforaminal disc protrusion at L4-5. No direct neural compression.      Marland Kitchen COPD 01/08/2007   PFT's 05/2007 : FEV1/FVC 82, FEV1 64% pred, FEF 25-75% 40% predicted, 16% improvement in FEV1 with bronchodilators.     . Depression   . Diabetes mellitus without complication ([place])    Type II  . Diabetic peripheral neuropathy ([place])   . DVT of upper extremity (deep vein thrombosis) ([place]) 03/11/2013   Secondary to PICC line. Right brachial vein, diagnosed on 03/10/2013 Coumadin for 3 months. End date 06/10/2013   .  Dyspnea    "smoker"  . Environmental allergies    Hx: of  . Exertional shortness of breath   . Fatty liver 2003   observed on ultrasound abdomen  . Fibromyalgia   . GERD (gastroesophageal reflux disease)   . Glaucoma   . History of use of hearing aid   . Hyperlipidemia   . Hyperplastic colon polyp 12/2010   Per colonoscopy (12/2010) - Dr. Deatra Ina  . Hypertension   . 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 Blood cultures 01/26/13 S. Aureus and GBS. Blood cultures Feb 6th, 8th, and 9th and March were negative.Repeat TEE 3/20 negative for vegitations  . 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 2/2 tobacco use, DM, and cont weight bearing on surgical wound and developed gangrene   . Pneumonia   . Polymicrobial bacterial infection 01/2013   GBS and S. aureus bacteremia // Source likely infected diabetic foot ulcer  . PVD (peripheral vascular disease) with claudication ([place])    Stents to bilateral common iliac arteries (left 2005, right 2008), on chronic plavix  . Rheumatoid arthritis ([place])   . S/P BKA (below knee amputation) unilateral [facility])    Oct 2014 L -  failed limb preserving treatment. 2/2 tobacco use, DM, and cont weight bearing on surgical wound and developed gangrene   . Tobacco abuse   . Type II diabetes mellitus with peripheral circulatory disorders, uncontrolled DX: 1993   Insulin dep. Poor control. Complicated by diabetic foot ulcer and diabetic eye disease.    Marland Kitchen Ulcer of foot, chronic (HCC)    Left. No OM per MRI (01/2013)    Past Surgical History:  Procedure Laterality Date  . ABDOMINAL HYSTERECTOMY  1997   secondary to uterine fibroids  . AMPUTATION Left 08/31/2013   Procedure: AMPUTATION RAY;  Surgeon: Newt Minion, MD;  Location: [place];  Service: Orthopedics;  Laterality: Left;  Left Foot 5th Ray Amputation  . AMPUTATION Left 09/28/2013    Procedure: Left Midfoot amputation;  Surgeon: Newt Minion, MD;  Location: [place];  Service: Orthopedics;  Laterality: Left;  Left Midfoot amputation  . AMPUTATION Left 10/14/2013   Procedure: AMPUTATION BELOW KNEE- left;  Surgeon: Newt Minion, MD;  Location: [place];  Service: Orthopedics;  Laterality: Left;  Left Below Knee Amputation   . AMPUTATION TOE Right 01/15/2017   Procedure: AMPUTATION 5th TOE RIGHT FOOT;  Surgeon: Edrick Kins, DPM;  Location: [place];  Service: Podiatry;  Laterality: Right;  . APPLICATION OF WOUND VAC  04/01/2019   Procedure: Application Of Wound Vac;  Surgeon: Newt Minion, MD;  Location: [place];  Service: Orthopedics;;  . BLADDER SURGERY     bladder reconstruction surgery  . BREAST BIOPSY     multiple-benign per pt  . COLONOSCOPY    . ESOPHAGOGASTRODUODENOSCOPY N/A 09/20/2013   Procedure: ESOPHAGOGASTRODUODENOSCOPY (EGD);  Surgeon: Jerene Bears, MD;  Location: [place];  Service: Gastroenterology;  Laterality: N/A;  . FOOT AMPUTATION THROUGH METATARSAL Left 09/28/2013  . GANGLION CYST EXCISION     multiple  . PERIPHERAL VASCULAR INTERVENTION     stents in lower ext  . SHOULDER ARTHROSCOPY Right 11/11/2019   RIGHT SHOULDER ARTHROSCOPY AND DEBRIDEMENT   . SHOULDER ARTHROSCOPY Right 11/11/2019   Procedure: RIGHT SHOULDER ARTHROSCOPY AND DEBRIDEMENT;  Surgeon: Newt Minion, MD;  Location: [place];  Service: Orthopedics;  Laterality: Right;  . SHOULDER ARTHROSCOPY W/ ROTATOR CUFF REPAIR Bilateral   . SKIN SPLIT GRAFT Bilateral 05/13/2013   Procedure: Right and Left Foot Allograft Skin Graft;  Surgeon: Newt Minion, MD;  Location: [place];  Service: Orthopedics;  Laterality: Bilateral;  Right and Left Foot Allograft Skin Graft  . STUMP REVISION Left 04/01/2019   Procedure: REVISION LEFT BELOW KNEE AMPUTATION;  Surgeon: Newt Minion, MD;  Location: [place];  Service: Orthopedics;  Laterality: Left;  . TEE WITHOUT CARDIOVERSION N/A 01/31/2013   Procedure:  TRANSESOPHAGEAL ECHOCARDIOGRAM (TEE);  Surgeon: Fay Records, MD;  Location: [place];  Service: Cardiovascular;  Laterality: N/A;  Rm [phone]  . TEE WITHOUT CARDIOVERSION N/A 03/10/2013   Procedure: TRANSESOPHAGEAL ECHOCARDIOGRAM (TEE);  Surgeon: Larey Dresser, MD;  Location: [place];  Service: Cardiovascular;  Laterality: N/A;  Rm. [phone]  . TOE AMPUTATION Left 08/31/2013   4TH & 5 TH TOE   . TONSILLECTOMY    . TUBAL LIGATION    . WRIST SURGERY Right    "for tumors" (09/28/2013)    There were no vitals filed for this visit.  Subjective Assessment - 03/13/20 1207    Subjective  Pt arriving to therapy 20 minutes late due to transportation issues. Pt reporting pain of 7/10 in R shoulder.  Pertinent History  Lt BKA, OA, asthma, CHF, chronic pain, depression, DM, PVD, RA, tobacco abuse    Limitations  Lifting    Patient Stated Goals  reach behind back    Currently in Pain?  Yes    Pain Score  7     Pain Location  Shoulder    Pain Orientation  Right    Pain Descriptors / Indicators  Aching    Pain Onset  More than a month ago    Pain Frequency  Constant                       [facility] Adult PT Treatment/Exercise - 03/13/20 0001      Exercises   Exercises  Shoulder      Shoulder Exercises: Seated   Row  AROM;Strengthening;Both;10 reps;Theraband    Theraband Level (Shoulder Row)  Level 1 (Yellow)    External Rotation  AROM;Strengthening;Both;15 reps;Theraband    Theraband Level (Shoulder External Rotation)  Level 1 (Yellow)    Other Seated Exercises  AA exercises shoulder  flexion, ER , UE ranger in flexion/scaption/abd, rolling over large threapy ball flexion x 10 reps    Other Seated Exercises  ball squeezes, bilateral ER, upper trap stretch x 3 holding                   PT Long Term Goals - 03/13/20 1233      PT LONG TERM GOAL #1   Title  independent with HEP    Baseline  MET 10/08/2015    Time  2    Period  Months    Status  On-going       PT LONG TERM GOAL #2   Title  improve functional external rotation to back of head for improved function    Baseline  MET 10/08/2015    Period  Months    Status  On-going      PT LONG TERM GOAL #3   Title  improve functional internal rotation to at least L2/3 for improved function    Baseline  10/09/16: 270 feet with 1 seated rest break, pt unable to go distance without resting.    Time  2    Period  Months    Status  On-going      PT LONG TERM GOAL #4   Title  report pain < 4/10 with activity for improved function and decreased pain    Baseline  10/10/15: 20.81 seconds with rollator    Time  2    Period  Months    Status  On-going      PT LONG TERM GOAL #5   Title  Rt shoulder strength at least 4/5 for improved function    Baseline  MET 10/08/2015 Berg Balance 34/56    Time  2    Period  Months    Status  On-going            Plan - 03/13/20 1222    Clinical Impression Statement  Pt arriving to therapy 20 minutes late due to transportation. Pt toleraing all exercises in seated position. Working and Probation officer. Pt's appointments and HEP were printed for pt. Continue skilled PT.    Personal Factors and Comorbidities  Age;Comorbidity 3+    Comorbidities  Lt BKA, OA, asthma, CHF, chronic pain, depression, DM, PVD, RA, tobacco abuse    Examination-Activity Limitations  Bathing;Dressing;Hygiene/Grooming;Toileting;Reach Overhead    Examination-Participation Restrictions  Actor  Evolving/Moderate complexity    Rehab Potential  Good    PT Frequency  2x / week    PT Duration  6 weeks    PT Treatment/Interventions  ADLs/Self Care Home Management;Cryotherapy;Electrical Stimulation;Ultrasound;Moist Heat;Iontophoresis 87m/ml Dexamethasone;Functional mobility training;Therapeutic activities;Therapeutic exercise;Patient/family education;Neuromuscular re-education;Manual techniques;Taping;Vasopneumatic Device;Dry needling;Passive range  of motion    PT Next Visit Plan  review HEP, work on functional ir/er, manual/modalities PRN, strengthening    PT Home Exercise Plan  Access Code: [email or web address]   Consulted and Agree with Plan of Care  Patient       Patient will benefit from skilled therapeutic intervention in order to improve the following deficits and impairments:  Pain, Postural dysfunction, Decreased range of motion, Decreased strength, Decreased endurance  Visit Diagnosis: Acute pain of right shoulder  Stiffness of right shoulder, not elsewhere classified  Abnormal posture  Muscle weakness (generalized)     Problem List Patient Active Problem List   Diagnosis Date Noted  . Hallucination, visual 01/17/2020  . Burn 01/17/2020  . Encounter for power mobility device assessment 11/24/2019  . Pain in joint of right shoulder 11/11/2019  . Nontraumatic incomplete tear of right rotator cuff   . CKD (chronic kidney disease) stage 3, GFR 30-59 ml/min 10/18/2019  . 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 Skin rash 06/30/2019  . Impingement syndrome of right shoulder 06/22/2019  . Urinary incontinence 05/13/2018  . Nocturnal hypoxia 06/12/2017  . Morbid obesity with BMI of 40.0-44.9, adult ([place] 03/26/2017  . Toe amputation status, right 01/16/2017  . Thrombocytopenia ([place] 06/05/2016  . 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 Type 2 diabetes with nephropathy ([place] 09/07/2015  . Uncontrolled diabetes mellitus with retinopathy, due to underlying condition, without macular edema ([place] 09/05/2015  . Diabetic retinopathy ([place] 09/05/2015  . Counseling regarding end of life decision making 06/14/2015  . Atherosclerosis of aorta ([place] 04/04/2015  . Anemia 10/05/2014  . Chronic diastolic heart failure ([place]   . Hx of BKA, left ([place]   . Tobacco abuse   . Severe obesity (BMI >= 40) ([place] 03/02/2013  . Abnormality of gait 03/01/2013  . Healthcare maintenance 07/10/2012  . Chronic prescription opiate use 12/03/2011  . Secondary diabetes  mellitus with peripheral vascular disease ([facility] 08/27/2011  . 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 ([facility] 11/29/2009  . Hypertension associated with diabetes ([place] 11/29/2009  . Chronic insomnia 10/25/2009  . GASTROESOPHAGEAL REFLUX DISEASE 11/24/2008  . Depression, major, severe recurrence ([facility] 04/06/2008  . DM (diabetes mellitus) type II uncontrolled, periph vascular disorder ([place] 04/02/2007  . Hyperlipidemia associated with type 2 diabetes mellitus ([place] 01/08/2007  . COPD exacerbation ([place] 01/08/2007    JOretha Caprice PT 03/13/2020, 12:37 PM  [facility] [address] [address]Phone: [phone]  Fax:  [phone] Name: JTAKAYLA BAILLIEMRN: [id number] of Birth: [birth year]

## 2020-03-14 ENCOUNTER — Ambulatory Visit: Payer: Medicare Other | Admitting: Dietician

## 2020-03-14 NOTE — Telephone Encounter (Signed)
The patient has been resch to 04/02/2020 @ 4 pm with [facility] Radiology.  Per Radiology Scheduler Elenore Rota) this pt will need a recent BUN/CREAT  and a Prescription sent to her pharmacy.  This pt is allergic to the Contrast per Chart and is going to require the 24 hour Contrast Prep.  If you have any questions in regards to the Prep please call [phone] and Speak to Radiology per Natarshi.

## 2020-03-15 ENCOUNTER — Ambulatory Visit: Payer: Medicare Other | Admitting: Dietician

## 2020-03-15 ENCOUNTER — Ambulatory Visit: Payer: Medicare Other

## 2020-03-15 ENCOUNTER — Encounter: Payer: Self-pay | Admitting: Dietician

## 2020-03-15 DIAGNOSIS — IMO0002 Reserved for concepts with insufficient information to code with codable children: Secondary | ICD-10-CM

## 2020-03-15 DIAGNOSIS — E1159 Type 2 diabetes mellitus with other circulatory complications: Secondary | ICD-10-CM

## 2020-03-15 DIAGNOSIS — E1151 Type 2 diabetes mellitus with diabetic peripheral angiopathy without gangrene: Secondary | ICD-10-CM

## 2020-03-15 NOTE — Patient Instructions (Signed)
Visit Information  Goals Addressed            This Visit's Progress   . "I would like to live on my own." (pt-stated)       [place] (see longtitudinal plan of care for additional care plan information)  Current Barriers:  . Housing barriers  Social Work Clinical Goal(s):  Marland Kitchen Over the next 60 days, patient will work with SW to address concerns related to housing  Interventions: . Talked with patient about current living situation.  Patient currently living with her sister, husband, and son.  Patient stated that she would like to get a place of her own, however her sister has requested that she stay there.  Patient expressed that she feels a since of obligation to her sister because she was homeless prior to moving in with her.  Current living situation seems to be stable outside of issue with pests.  Patient encouraged to address this with landlord. . Patient interviewed and appropriate assessments performed . Provided patient with information about low income housing and Assisted Living Facilities.   . Informed patient that low income housing options almost always have wait lists; sometimes as long as two years.  Patient was encouraged to get added to wait lists if she plans to move within the next year.    Patient Self Care Activities:  . Attends all scheduled provider appointments . Performs ADL's independently . Calls provider office for new concerns or questions   Initial goal documentation        Patient verbalizes understanding of instructions provided today.   Telephone follow up appointment with care management team member scheduled for:03/26/20   .   Ronn Melena, [place] Coordination Social Worker [place] [phone]

## 2020-03-15 NOTE — Chronic Care Management (AMB) (Signed)
  Care Management   Follow Up Note   03/15/2020 Name: Jennifer [name] MRN: [id number] DOB: 27-Dec-1960  Referred by: Bartholomew Crews, MD Reason for referral : Care Coordination (SDOH Screening/Housing)   Jennifer [name] is a 59 y.o. year old female who is a primary care patient of Bartholomew Crews, MD. The care management team was consulted for assistance with care management and care coordination needs.    Review of patient status, including review of consultants reports, relevant laboratory and other test results, and collaboration with appropriate care team members and the patient's provider was performed as part of comprehensive patient evaluation and provision of chronic care management services.    SDOH (Social Determinants of Health) assessments performed: Yes See Care Plan activities for detailed interventions related to SDOH)  SDOH Interventions     Most Recent Value  SDOH Interventions  Housing Interventions  -- [provided patient with low income housing options and ALF's]       Advanced Directives: See Care Plan and Vynca application for related entries.   Goals Addressed            This Visit's Progress   . "I would like to live on my own." (pt-stated)       [place] (see longtitudinal plan of care for additional care plan information)  Current Barriers:  . Housing barriers  Social Work Clinical Goal(s):  Marland Kitchen Over the next 60 days, patient will work with SW to address concerns related to housing  Interventions: . Talked with patient about current living situation.  Patient currently living with her sister, husband, and son.  Patient stated that she would like to get a place of her own, however her sister has requested that she stay there.  Patient expressed that she feels a since of obligation to her sister because she was homeless prior to moving in with her.  Current living situation seems to be stable outside of issue with pests.  Patient encouraged  to address this with landlord. . Patient interviewed and appropriate assessments performed . Provided patient with information about low income housing and Assisted Living Facilities.   . Informed patient that low income housing options almost always have wait lists; sometimes as long as two years.  Patient was encouraged to get added to wait lists if she plans to move within the next year.    Patient Self Care Activities:  . Attends all scheduled provider appointments . Performs ADL's independently . Calls provider office for new concerns or questions   Initial goal documentation         The care management team will reach out to the patient again over the next 14 days.    Ronn Melena, [place] Coordination Social Worker Jennifer [name] [phone]

## 2020-03-15 NOTE — Progress Notes (Signed)
  Diabetes Self-Management Education  Visit Type:  Follow up  Appt. Start Time: 1500 Appt. End Time: [id number]  03/16/2020  [name], identified by name and date of birth, is a 59 y.o. female with a diagnosis of Diabetes:  Type 2  ASSESSMENT/PLAN  Ms Matsuno is here to follow up in her correction and meal time Self monitoring: Unable to download meter today.   Date  Breakfast Lunch  Dinner   Bedtime 3/20     379 3/19     314 3/18     317    361 3/15 243 3/14     252    353 3/11     181    207 3/9     203  3/8 236  3/7     215  Medications for diabetes: metformin ER 500 mg daily, Ozempic 0.5 on Mondays Tresiba u200 70 units daily, Humalog 14-35 units 2-3 times a day ( but does not check blood sugar often enough to accurately correct her blood sugars)    [name] feels she has been eating the same, taking the same medications, does report a new bladder infection currently being treated.  Not enough data to decipher a glucose pattern. She has many meters and brought only one today.   Plan: no change to her current correction insulin. Encourage her to take it 3 times daily after checking her blood sugars.   [name], RD 03/16/2020 10:41 AM.

## 2020-03-15 NOTE — Patient Instructions (Addendum)
I think your blood sugar are doing better with taking the mealtime insulin 2-3 times day.  The closer you can get to 3 times a day the better.   IF you are not going to eat a meal, you can take correction insulin by subtracting 10 units form the scale I gave you.   Keep taking Humalog   you are signed up for the bariatric seminar on April 8 at [facility] on [facility].from 545-

## 2020-03-16 ENCOUNTER — Other Ambulatory Visit: Payer: Self-pay | Admitting: Internal Medicine

## 2020-03-16 ENCOUNTER — Other Ambulatory Visit: Payer: Self-pay | Admitting: *Deleted

## 2020-03-16 ENCOUNTER — Encounter: Payer: Medicare Other | Admitting: Physical Therapy

## 2020-03-16 ENCOUNTER — Encounter: Payer: Self-pay | Admitting: Internal Medicine

## 2020-03-16 DIAGNOSIS — E1151 Type 2 diabetes mellitus with diabetic peripheral angiopathy without gangrene: Secondary | ICD-10-CM

## 2020-03-16 DIAGNOSIS — IMO0002 Reserved for concepts with insufficient information to code with codable children: Secondary | ICD-10-CM

## 2020-03-16 DIAGNOSIS — E1121 Type 2 diabetes mellitus with diabetic nephropathy: Secondary | ICD-10-CM

## 2020-03-16 MED ORDER — INSULIN LISPRO (1 UNIT DIAL) 100 UNIT/ML (KWIKPEN): PEN_INJECTOR | SUBCUTANEOUS | 0 refills | Status: DC

## 2020-03-16 MED ORDER — OZEMPIC (0.25 OR 0.5 MG/DOSE) 2 MG/1.5ML ~~LOC~~ SOPN: 0.5000 mg | PEN_INJECTOR | SUBCUTANEOUS | 0 refills | Status: DC

## 2020-03-16 NOTE — Telephone Encounter (Signed)
Thank you for contacting the Radiologist as requested by the [place].  Patient has been notified of this appointment.

## 2020-03-16 NOTE — Progress Notes (Signed)
Internal Medicine Clinic Resident  I have personally reviewed this encounter including the documentation in this note and/or discussed this patient with the care management provider. I will address any urgent items identified by the care management provider and will communicate my actions to the patient's PCP. I have reviewed the patient's CCM visit with my supervising attending.  Maya Arcand, MD 03/16/2020    

## 2020-03-16 NOTE — Telephone Encounter (Signed)
Pt called / informed of refills. "Thank-you".

## 2020-03-16 NOTE — Telephone Encounter (Signed)
Call from pt - stated she just received her meds from [facility] except for Humalog and Ozempic and she's completely out of Humalog. Requesting refills sent to [facility]. Thanks

## 2020-03-16 NOTE — Telephone Encounter (Signed)
The order for the creatinine is in.  I spoke to the radiologist and also Jennifer [name].  She does not have an allergic reaction to MRI dye.  Her allergy listed in epic is transient nephropathy to IV contrast dye.  She does not have a contraindication to MRI dye and does not need the 24-hour contrast prep.

## 2020-03-19 ENCOUNTER — Encounter: Payer: Medicare Other | Admitting: Physical Therapy

## 2020-03-21 ENCOUNTER — Encounter: Payer: Self-pay | Admitting: Physical Therapy

## 2020-03-21 ENCOUNTER — Other Ambulatory Visit: Payer: Self-pay

## 2020-03-21 ENCOUNTER — Ambulatory Visit (INDEPENDENT_AMBULATORY_CARE_PROVIDER_SITE_OTHER): Payer: Medicare Other | Admitting: Physical Therapy

## 2020-03-21 DIAGNOSIS — R293 Abnormal posture: Secondary | ICD-10-CM

## 2020-03-21 DIAGNOSIS — M25511 Pain in right shoulder: Secondary | ICD-10-CM | POA: Diagnosis not present

## 2020-03-21 DIAGNOSIS — M6281 Muscle weakness (generalized): Secondary | ICD-10-CM

## 2020-03-21 DIAGNOSIS — M25611 Stiffness of right shoulder, not elsewhere classified: Secondary | ICD-10-CM | POA: Diagnosis not present

## 2020-03-21 NOTE — Therapy (Signed)
[facility] [address] [place], [place], [address] Phone: [phone]   Fax:  [phone]  Physical Therapy Treatment  Patient Details  Name: Jennifer [name] MRN: [id number] Date of Birth: 05/10/1961 Referring Provider (PT): Suzan Slick, NP   Encounter Date: 03/21/2020  PT End of Session - 03/21/20 1355    Visit Number  3    Number of Visits  12    Date for PT Re-Evaluation  04/16/20    PT Start Time  [id number]    PT Stop Time  1405    PT Time Calculation (min)  50 min    Activity Tolerance  Patient tolerated treatment well    Behavior During Therapy  [facility] for tasks assessed/performed       Past Medical History:  Diagnosis Date  . Anginal pain [facility])    '3' of 10 ischemia ruled out 9/9   . Arthritis of lumbar spine   . Asthma   . Cataract   . CHF (congestive heart failure) ([place])   . 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 Chronic bronchitis ([place])    "I get it alot" (09/28/2013)  . Chronic diastolic heart failure (HCC)    grade 2 per 2D echocardiogram (01/2013)  . Chronic lower back pain   . Chronic osteomyelitis of foot (HCC)    chronic, right secondary to diabetic foot ulcers  . Chronic pain syndrome 12/03/2011   Likely secondary to depression, "fibromyalgia", neuropathy, and obesity. Lumbar MRI 2014 no sig change from prior (2008) : Stable hypertrophic facet disease most notable at L4-5. Stable shallow left foraminal/extraforaminal disc protrusion at L4-5. No direct neural compression.      Marland Kitchen COPD 01/08/2007   PFT's 05/2007 : FEV1/FVC 82, FEV1 64% pred, FEF 25-75% 40% predicted, 16% improvement in FEV1 with bronchodilators.     . Depression   . Diabetes mellitus without complication ([place])    Type II  . Diabetic peripheral neuropathy ([place])   . DVT of upper extremity (deep vein thrombosis) ([place]) 03/11/2013   Secondary to PICC line. Right brachial vein, diagnosed on 03/10/2013 Coumadin for 3 months. End date 06/10/2013   .  Dyspnea    "smoker"  . Environmental allergies    Hx: of  . Exertional shortness of breath   . Fatty liver 2003   observed on ultrasound abdomen  . Fibromyalgia   . GERD (gastroesophageal reflux disease)   . Glaucoma   . History of use of hearing aid   . Hyperlipidemia   . Hyperplastic colon polyp 12/2010   Per colonoscopy (12/2010) - Dr. Deatra Ina  . Hypertension   . 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 Blood cultures 01/26/13 S. Aureus and GBS. Blood cultures Feb 6th, 8th, and 9th and March were negative.Repeat TEE 3/20 negative for vegitations  . 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 2/2 tobacco use, DM, and cont weight bearing on surgical wound and developed gangrene   . Pneumonia   . Polymicrobial bacterial infection 01/2013   GBS and S. aureus bacteremia // Source likely infected diabetic foot ulcer  . PVD (peripheral vascular disease) with claudication ([place])    Stents to bilateral common iliac arteries (left 2005, right 2008), on chronic plavix  . Rheumatoid arthritis ([place])   . S/P BKA (below knee amputation) unilateral [facility])    Oct 2014 L -  failed limb preserving treatment. 2/2 tobacco use, DM, and cont weight bearing on surgical wound and developed gangrene   . Tobacco abuse   . Type II diabetes mellitus with peripheral circulatory disorders, uncontrolled DX: 1993   Insulin dep. Poor control. Complicated by diabetic foot ulcer and diabetic eye disease.    Marland Kitchen Ulcer of foot, chronic (HCC)    Left. No OM per MRI (01/2013)    Past Surgical History:  Procedure Laterality Date  . ABDOMINAL HYSTERECTOMY  1997   secondary to uterine fibroids  . AMPUTATION Left 08/31/2013   Procedure: AMPUTATION RAY;  Surgeon: Newt Minion, MD;  Location: [place];  Service: Orthopedics;  Laterality: Left;  Left Foot 5th Ray Amputation  . AMPUTATION Left 09/28/2013    Procedure: Left Midfoot amputation;  Surgeon: Newt Minion, MD;  Location: [place];  Service: Orthopedics;  Laterality: Left;  Left Midfoot amputation  . AMPUTATION Left 10/14/2013   Procedure: AMPUTATION BELOW KNEE- left;  Surgeon: Newt Minion, MD;  Location: [place];  Service: Orthopedics;  Laterality: Left;  Left Below Knee Amputation   . AMPUTATION TOE Right 01/15/2017   Procedure: AMPUTATION 5th TOE RIGHT FOOT;  Surgeon: Edrick Kins, DPM;  Location: [place];  Service: Podiatry;  Laterality: Right;  . APPLICATION OF WOUND VAC  04/01/2019   Procedure: Application Of Wound Vac;  Surgeon: Newt Minion, MD;  Location: [place];  Service: Orthopedics;;  . BLADDER SURGERY     bladder reconstruction surgery  . BREAST BIOPSY     multiple-benign per pt  . COLONOSCOPY    . ESOPHAGOGASTRODUODENOSCOPY N/A 09/20/2013   Procedure: ESOPHAGOGASTRODUODENOSCOPY (EGD);  Surgeon: Jerene Bears, MD;  Location: [place];  Service: Gastroenterology;  Laterality: N/A;  . FOOT AMPUTATION THROUGH METATARSAL Left 09/28/2013  . GANGLION CYST EXCISION     multiple  . PERIPHERAL VASCULAR INTERVENTION     stents in lower ext  . SHOULDER ARTHROSCOPY Right 11/11/2019   RIGHT SHOULDER ARTHROSCOPY AND DEBRIDEMENT   . SHOULDER ARTHROSCOPY Right 11/11/2019   Procedure: RIGHT SHOULDER ARTHROSCOPY AND DEBRIDEMENT;  Surgeon: Newt Minion, MD;  Location: [place];  Service: Orthopedics;  Laterality: Right;  . SHOULDER ARTHROSCOPY W/ ROTATOR CUFF REPAIR Bilateral   . SKIN SPLIT GRAFT Bilateral 05/13/2013   Procedure: Right and Left Foot Allograft Skin Graft;  Surgeon: Newt Minion, MD;  Location: [place];  Service: Orthopedics;  Laterality: Bilateral;  Right and Left Foot Allograft Skin Graft  . STUMP REVISION Left 04/01/2019   Procedure: REVISION LEFT BELOW KNEE AMPUTATION;  Surgeon: Newt Minion, MD;  Location: [place];  Service: Orthopedics;  Laterality: Left;  . TEE WITHOUT CARDIOVERSION N/A 01/31/2013   Procedure:  TRANSESOPHAGEAL ECHOCARDIOGRAM (TEE);  Surgeon: Fay Records, MD;  Location: [place];  Service: Cardiovascular;  Laterality: N/A;  Rm [phone]  . TEE WITHOUT CARDIOVERSION N/A 03/10/2013   Procedure: TRANSESOPHAGEAL ECHOCARDIOGRAM (TEE);  Surgeon: Larey Dresser, MD;  Location: [place];  Service: Cardiovascular;  Laterality: N/A;  Rm. [phone]  . TOE AMPUTATION Left 08/31/2013   4TH & 5 TH TOE   . TONSILLECTOMY    . TUBAL LIGATION    . WRIST SURGERY Right    "for tumors" (09/28/2013)    There were no vitals filed for this visit.  Subjective Assessment - 03/21/20 1312    Subjective  Rt shoulder is sore. Tried to pick up a jar of mayo which made it hurt.    Pertinent  History  Lt BKA, OA, asthma, CHF, chronic pain, depression, DM, PVD, RA, tobacco abuse    Limitations  Lifting    Patient Stated Goals  reach behind back    Currently in Pain?  Yes    Pain Score  8     Pain Location  Shoulder    Pain Orientation  Right    Pain Descriptors / Indicators  Aching    Pain Type  Acute pain;Chronic pain;Surgical pain    Pain Onset  More than a month ago    Pain Frequency  Constant    Aggravating Factors   lifting arm    Pain Relieving Factors  medication                       [facility] Adult PT Treatment/Exercise - 03/21/20 1320      Shoulder Exercises: Seated   Extension  AAROM;20 reps   1# bar   Row  AROM;Strengthening;Both;Theraband;20 reps    Theraband Level (Shoulder Row)  Level 1 (Yellow)    External Rotation  AROM;Strengthening;Both;Theraband;20 reps    Theraband Level (Shoulder External Rotation)  Level 1 (Yellow)    Internal Rotation  AAROM;20 reps   1# bar   Internal Rotation Limitations  hand across back; and hands up back    Other Seated Exercises  ball squeezes 20x5 sec; 2# weighted ball flexion, circles x 20 reps      Shoulder Exercises: Pulleys   Flexion  2 minutes    Scaption  2 minutes      Shoulder Exercises: Therapy Ball   Flexion  Right;10 reps     Flexion Limitations  seated    Scaption  Right;10 reps    Scaption Limitations  seated      Modalities   Modalities  Moist Heat      Moist Heat Therapy   Number Minutes Moist Heat  10 Minutes    Moist Heat Location  Shoulder                  PT Long Term Goals - 03/21/20 1356      PT LONG TERM GOAL #1   Title  independent with HEP    Time  2    Period  Months    Status  On-going    Target Date  04/16/20      PT LONG TERM GOAL #2   Title  improve functional external rotation to back of head for improved function    Period  Months    Status  On-going      PT LONG TERM GOAL #3   Title  improve functional internal rotation to at least L2/3 for improved function    Time  2    Period  Months    Status  On-going      PT LONG TERM GOAL #4   Title  report pain < 4/10 with activity for improved function and decreased pain    Time  2    Period  Months    Status  On-going      PT LONG TERM GOAL #5   Title  Rt shoulder strength at least 4/5 for improved function    Time  2    Period  Months    Status  On-going            Plan - 03/21/20 1356    Clinical Impression Statement  Pt tolerated session well today and able to work  ROM and strengthening, with expected fatigue.  Will continue to benefit from PT to maximize function.    Personal Factors and Comorbidities  Age;Comorbidity 3+    Comorbidities  Lt BKA, OA, asthma, CHF, chronic pain, depression, DM, PVD, RA, tobacco abuse    Examination-Activity Limitations  Bathing;Dressing;Hygiene/Grooming;Toileting;Reach Overhead    Examination-Participation Restrictions  Cleaning    Stability/Clinical Decision Making  Evolving/Moderate complexity    Rehab Potential  Good    PT Frequency  2x / week    PT Duration  6 weeks    PT Treatment/Interventions  ADLs/Self Care Home Management;Cryotherapy;Electrical Stimulation;Ultrasound;Moist Heat;Iontophoresis 4mg /ml Dexamethasone;Functional mobility training;Therapeutic  activities;Therapeutic exercise;Patient/family education;Neuromuscular re-education;Manual techniques;Taping;Vasopneumatic Device;Dry needling;Passive range of motion    PT Next Visit Plan  work on functional ir/er, manual/modalities PRN, strengthening    PT Home Exercise Plan  Access Code: [email or web address]    Consulted and Agree with Plan of Care  Patient       Patient will benefit from skilled therapeutic intervention in order to improve the following deficits and impairments:  Pain, Postural dysfunction, Decreased range of motion, Decreased strength, Decreased endurance  Visit Diagnosis: Acute pain of right shoulder  Stiffness of right shoulder, not elsewhere classified  Abnormal posture  Muscle weakness (generalized)     Problem List Patient Active Problem List   Diagnosis Date Noted  . Hallucination, visual 01/17/2020  . Burn 01/17/2020  . Encounter for power mobility device assessment 11/24/2019  . Pain in joint of right shoulder 11/11/2019  . Nontraumatic incomplete tear of right rotator cuff   . CKD (chronic kidney disease) stage 3, GFR 30-59 ml/min 10/18/2019  . 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 Skin rash 06/30/2019  . Impingement syndrome of right shoulder 06/22/2019  . Urinary incontinence 05/13/2018  . Nocturnal hypoxia 06/12/2017  . Morbid obesity with BMI of 40.0-44.9, adult ([place]) 03/26/2017  . Toe amputation status, right 01/16/2017  . Thrombocytopenia ([place]) 06/05/2016  . 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 Type 2 diabetes with nephropathy ([place]) 09/07/2015  . Uncontrolled diabetes mellitus with retinopathy, due to underlying condition, without macular edema ([place]) 09/05/2015  . Diabetic retinopathy ([place]) 09/05/2015  . Counseling regarding end of life decision making 06/14/2015  . Atherosclerosis of aorta ([place]) 04/04/2015  . Anemia 10/05/2014  . Chronic diastolic heart failure ([place])   . Hx of BKA, left ([place])   . Tobacco abuse   . Severe obesity (BMI >= 40) ([place]) 03/02/2013  .  Abnormality of gait 03/01/2013  . Healthcare maintenance 07/10/2012  . Chronic prescription opiate use 12/03/2011  . Secondary diabetes mellitus with peripheral vascular disease ([place]) 08/27/2011  . 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 ([place]) 11/29/2009  . Hypertension associated with diabetes ([place]) 11/29/2009  . Chronic insomnia 10/25/2009  . GASTROESOPHAGEAL REFLUX DISEASE 11/24/2008  . Depression, major, severe recurrence ([place]) 04/06/2008  . DM (diabetes mellitus) type II uncontrolled, periph vascular disorder ([place]) 04/02/2007  . Hyperlipidemia associated with type 2 diabetes mellitus ([place]) 01/08/2007  . COPD exacerbation ([place]) 01/08/2007      Laureen Abrahams, PT, DPT 03/21/20 1:58 PM     [facility] [address] [place], [place], [address] Phone: [phone]   Fax:  [phone]  Name: Jennifer [name] MRN: [id number] Date of Birth: 02-15-1961

## 2020-03-21 NOTE — Progress Notes (Signed)
Internal Medicine Clinic Attending  CCM services provided by the care management provider and their documentation were discussed with Dr. Harbrecht. We reviewed the pertinent findings, urgent action items addressed by the resident and non-urgent items to be addressed by the PCP.  I agree with the assessment, diagnosis, and plan of care documented in the CCM and resident's note.  Fabion Gatson, MD 03/21/2020 

## 2020-03-22 ENCOUNTER — Telehealth: Payer: Self-pay | Admitting: Internal Medicine

## 2020-03-22 NOTE — Telephone Encounter (Signed)
Pt calling to report that she had spoken with the DME department with  [facility] this afternoon their DME Department has overturned her denial for the [facility] and that she WILL be Rec'ing a new PWC.

## 2020-03-22 NOTE — Telephone Encounter (Signed)
[employer] 258lbs today and told ph nurse for [employer] that she hasnt been taking her diuretics because she "pees too much" otherwise she is doing well, stated she is not having trouble breathing. She will be getting her new wheelchair.

## 2020-03-22 NOTE — Telephone Encounter (Signed)
Thank you :)

## 2020-03-26 ENCOUNTER — Ambulatory Visit: Payer: Self-pay

## 2020-03-26 ENCOUNTER — Telehealth: Payer: Medicare Other

## 2020-03-26 NOTE — Chronic Care Management (AMB) (Signed)
  Chronic Care Management   Outreach Note  03/26/2020 Name: Jennifer [name] MRN: [id number] DOB: 02-Mar-1961  Referred by: Bartholomew Crews, MD Reason for referral : Care Coordination (DME, Housing)   An unsuccessful telephone outreach was attempted today. The patient was referred to the case management team for assistance with care management and care coordination.   Follow Up Plan: The care management team will reach out to the patient again over the next 7 days.      Ronn Melena, [place] Coordination Social Worker [place] [phone]

## 2020-03-27 ENCOUNTER — Ambulatory Visit (INDEPENDENT_AMBULATORY_CARE_PROVIDER_SITE_OTHER): Payer: Medicare Other | Admitting: Physical Therapy

## 2020-03-27 ENCOUNTER — Encounter: Payer: Self-pay | Admitting: Physical Therapy

## 2020-03-27 ENCOUNTER — Other Ambulatory Visit: Payer: Self-pay

## 2020-03-27 DIAGNOSIS — M25611 Stiffness of right shoulder, not elsewhere classified: Secondary | ICD-10-CM | POA: Diagnosis not present

## 2020-03-27 DIAGNOSIS — M25511 Pain in right shoulder: Secondary | ICD-10-CM | POA: Diagnosis not present

## 2020-03-27 DIAGNOSIS — M6281 Muscle weakness (generalized): Secondary | ICD-10-CM | POA: Diagnosis not present

## 2020-03-27 DIAGNOSIS — R293 Abnormal posture: Secondary | ICD-10-CM | POA: Diagnosis not present

## 2020-03-27 NOTE — Therapy (Signed)
[facility] [address] [place], [place], [address] Phone: [phone]   Fax:  [phone]  Physical Therapy Treatment  Patient Details  Name: Jennifer [name] MRN: [id number] Date of Birth: 1961/02/28 Referring Provider (PT): Suzan Slick, NP   Encounter Date: 03/27/2020  PT End of Session - 03/27/20 1603    Visit Number  4    Number of Visits  12    Date for PT Re-Evaluation  04/16/20    PT Start Time  [id number]    PT Stop Time  1230    PT Time Calculation (min)  48 min    Activity Tolerance  Patient tolerated treatment well    Behavior During Therapy  [facility] for tasks assessed/performed       Past Medical History:  Diagnosis Date  . Anginal pain [facility])    '3' of 10 ischemia ruled out 9/9   . Arthritis of lumbar spine   . Asthma   . Cataract   . CHF (congestive heart failure) ([place])   . 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 Chronic bronchitis ([place])    "I get it alot" (09/28/2013)  . Chronic diastolic heart failure (HCC)    grade 2 per 2D echocardiogram (01/2013)  . Chronic lower back pain   . Chronic osteomyelitis of foot (HCC)    chronic, right secondary to diabetic foot ulcers  . Chronic pain syndrome 12/03/2011   Likely secondary to depression, "fibromyalgia", neuropathy, and obesity. Lumbar MRI 2014 no sig change from prior (2008) : Stable hypertrophic facet disease most notable at L4-5. Stable shallow left foraminal/extraforaminal disc protrusion at L4-5. No direct neural compression.      Marland Kitchen COPD 01/08/2007   PFT's 05/2007 : FEV1/FVC 82, FEV1 64% pred, FEF 25-75% 40% predicted, 16% improvement in FEV1 with bronchodilators.     . Depression   . Diabetes mellitus without complication ([place])    Type II  . Diabetic peripheral neuropathy ([place])   . DVT of upper extremity (deep vein thrombosis) ([place]) 03/11/2013   Secondary to PICC line. Right brachial vein, diagnosed on 03/10/2013 Coumadin for 3 months. End date 06/10/2013   .  Dyspnea    "smoker"  . Environmental allergies    Hx: of  . Exertional shortness of breath   . Fatty liver 2003   observed on ultrasound abdomen  . Fibromyalgia   . GERD (gastroesophageal reflux disease)   . Glaucoma   . History of use of hearing aid   . Hyperlipidemia   . Hyperplastic colon polyp 12/2010   Per colonoscopy (12/2010) - Dr. Deatra Ina  . Hypertension   . 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 Blood cultures 01/26/13 S. Aureus and GBS. Blood cultures Feb 6th, 8th, and 9th and March were negative.Repeat TEE 3/20 negative for vegitations  . 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 2/2 tobacco use, DM, and cont weight bearing on surgical wound and developed gangrene   . Pneumonia   . Polymicrobial bacterial infection 01/2013   GBS and S. aureus bacteremia // Source likely infected diabetic foot ulcer  . PVD (peripheral vascular disease) with claudication ([place])    Stents to bilateral common iliac arteries (left 2005, right 2008), on chronic plavix  . Rheumatoid arthritis ([place])   . S/P BKA (below knee amputation) unilateral [facility])    Oct 2014 L -  failed limb preserving treatment. 2/2 tobacco use, DM, and cont weight bearing on surgical wound and developed gangrene   . Tobacco abuse   . Type II diabetes mellitus with peripheral circulatory disorders, uncontrolled DX: 1993   Insulin dep. Poor control. Complicated by diabetic foot ulcer and diabetic eye disease.    Marland Kitchen Ulcer of foot, chronic (HCC)    Left. No OM per MRI (01/2013)    Past Surgical History:  Procedure Laterality Date  . ABDOMINAL HYSTERECTOMY  1997   secondary to uterine fibroids  . AMPUTATION Left 08/31/2013   Procedure: AMPUTATION RAY;  Surgeon: Newt Minion, MD;  Location: [place];  Service: Orthopedics;  Laterality: Left;  Left Foot 5th Ray Amputation  . AMPUTATION Left 09/28/2013    Procedure: Left Midfoot amputation;  Surgeon: Newt Minion, MD;  Location: [place];  Service: Orthopedics;  Laterality: Left;  Left Midfoot amputation  . AMPUTATION Left 10/14/2013   Procedure: AMPUTATION BELOW KNEE- left;  Surgeon: Newt Minion, MD;  Location: [place];  Service: Orthopedics;  Laterality: Left;  Left Below Knee Amputation   . AMPUTATION TOE Right 01/15/2017   Procedure: AMPUTATION 5th TOE RIGHT FOOT;  Surgeon: Edrick Kins, DPM;  Location: [place];  Service: Podiatry;  Laterality: Right;  . APPLICATION OF WOUND VAC  04/01/2019   Procedure: Application Of Wound Vac;  Surgeon: Newt Minion, MD;  Location: [place];  Service: Orthopedics;;  . BLADDER SURGERY     bladder reconstruction surgery  . BREAST BIOPSY     multiple-benign per pt  . COLONOSCOPY    . ESOPHAGOGASTRODUODENOSCOPY N/A 09/20/2013   Procedure: ESOPHAGOGASTRODUODENOSCOPY (EGD);  Surgeon: Jerene Bears, MD;  Location: [place];  Service: Gastroenterology;  Laterality: N/A;  . FOOT AMPUTATION THROUGH METATARSAL Left 09/28/2013  . GANGLION CYST EXCISION     multiple  . PERIPHERAL VASCULAR INTERVENTION     stents in lower ext  . SHOULDER ARTHROSCOPY Right 11/11/2019   RIGHT SHOULDER ARTHROSCOPY AND DEBRIDEMENT   . SHOULDER ARTHROSCOPY Right 11/11/2019   Procedure: RIGHT SHOULDER ARTHROSCOPY AND DEBRIDEMENT;  Surgeon: Newt Minion, MD;  Location: [place];  Service: Orthopedics;  Laterality: Right;  . SHOULDER ARTHROSCOPY W/ ROTATOR CUFF REPAIR Bilateral   . SKIN SPLIT GRAFT Bilateral 05/13/2013   Procedure: Right and Left Foot Allograft Skin Graft;  Surgeon: Newt Minion, MD;  Location: [place];  Service: Orthopedics;  Laterality: Bilateral;  Right and Left Foot Allograft Skin Graft  . STUMP REVISION Left 04/01/2019   Procedure: REVISION LEFT BELOW KNEE AMPUTATION;  Surgeon: Newt Minion, MD;  Location: [place];  Service: Orthopedics;  Laterality: Left;  . TEE WITHOUT CARDIOVERSION N/A 01/31/2013   Procedure:  TRANSESOPHAGEAL ECHOCARDIOGRAM (TEE);  Surgeon: Fay Records, MD;  Location: [place];  Service: Cardiovascular;  Laterality: N/A;  Rm [phone]  . TEE WITHOUT CARDIOVERSION N/A 03/10/2013   Procedure: TRANSESOPHAGEAL ECHOCARDIOGRAM (TEE);  Surgeon: Larey Dresser, MD;  Location: [place];  Service: Cardiovascular;  Laterality: N/A;  Rm. [phone]  . TOE AMPUTATION Left 08/31/2013   4TH & 5 TH TOE   . TONSILLECTOMY    . TUBAL LIGATION    . WRIST SURGERY Right    "for tumors" (09/28/2013)    There were no vitals filed for this visit.  Subjective Assessment - 03/27/20 1146    Subjective  shoulder is doing "a little better, not a whole lot."    Pertinent History  Lt BKA, OA, asthma,  CHF, chronic pain, depression, DM, PVD, RA, tobacco abuse    Limitations  Lifting    Patient Stated Goals  reach behind back    Currently in Pain?  Yes    Pain Score  5     Pain Location  Shoulder    Pain Orientation  Right    Pain Descriptors / Indicators  Aching    Pain Type  Acute pain;Chronic pain;Surgical pain    Pain Onset  More than a month ago    Pain Frequency  Constant    Aggravating Factors   lifting arm    Pain Relieving Factors  medication                       [facility] Adult PT Treatment/Exercise - 03/27/20 1147      Shoulder Exercises: Seated   Extension  AAROM;20 reps   1# bar   Horizontal ABduction  Both;20 reps;Theraband    Theraband Level (Shoulder Horizontal ABduction)  Level 2 (Red)    External Rotation  AROM;Strengthening;Both;Theraband;20 reps    Theraband Level (Shoulder External Rotation)  Level 2 (Red)    Internal Rotation  AAROM;20 reps   1# bar   Internal Rotation Limitations  hand across back; and hands up back    Flexion  Right;20 reps;Weights    Flexion Weight (lbs)  1    Diagonals  Right;20 reps;Theraband    Theraband Level (Shoulder Diagonals)  Level 2 (Red)    Other Seated Exercises  chest press 1# on Rt x20 reps; scaption 1# Rt x 20 reps, overhead  press Rt x 20 reps with 1#    Other Seated Exercises  hands together reaching up and behind head x 20 reps      Shoulder Exercises: Pulleys   Flexion  2 minutes    Scaption  2 minutes      Shoulder Exercises: Stretch   Internal Rotation Stretch  10 seconds   10 reps     Moist Heat Therapy   Number Minutes Moist Heat  10 Minutes    Moist Heat Location  Shoulder                  PT Long Term Goals - 03/21/20 1356      PT LONG TERM GOAL #1   Title  independent with HEP    Time  2    Period  Months    Status  On-going    Target Date  04/16/20      PT LONG TERM GOAL #2   Title  improve functional external rotation to back of head for improved function    Period  Months    Status  On-going      PT LONG TERM GOAL #3   Title  improve functional internal rotation to at least L2/3 for improved function    Time  2    Period  Months    Status  On-going      PT LONG TERM GOAL #4   Title  report pain < 4/10 with activity for improved function and decreased pain    Time  2    Period  Months    Status  On-going      PT LONG TERM GOAL #5   Title  Rt shoulder strength at least 4/5 for improved function    Time  2    Period  Months    Status  On-going  Plan - 03/27/20 1604    Clinical Impression Statement  Pt progressing well with PT and demonstrated improvement in functional IR to T11/12 today with mild pain.  Will continue to benefit from PT to maximize function.    Personal Factors and Comorbidities  Age;Comorbidity 3+    Comorbidities  Lt BKA, OA, asthma, CHF, chronic pain, depression, DM, PVD, RA, tobacco abuse    Examination-Activity Limitations  Bathing;Dressing;Hygiene/Grooming;Toileting;Reach Overhead    Examination-Participation Restrictions  Cleaning    Stability/Clinical Decision Making  Evolving/Moderate complexity    Rehab Potential  Good    PT Frequency  2x / week    PT Duration  6 weeks    PT Treatment/Interventions  ADLs/Self Care  Home Management;Cryotherapy;Electrical Stimulation;Ultrasound;Moist Heat;Iontophoresis 4mg /ml Dexamethasone;Functional mobility training;Therapeutic activities;Therapeutic exercise;Patient/family education;Neuromuscular re-education;Manual techniques;Taping;Vasopneumatic Device;Dry needling;Passive range of motion    PT Next Visit Plan  work on functional ir/er, manual/modalities PRN, strengthening    PT Home Exercise Plan  Access Code: [email or web address]    Consulted and Agree with Plan of Care  Patient       Patient will benefit from skilled therapeutic intervention in order to improve the following deficits and impairments:  Pain, Postural dysfunction, Decreased range of motion, Decreased strength, Decreased endurance  Visit Diagnosis: Acute pain of right shoulder  Stiffness of right shoulder, not elsewhere classified  Abnormal posture  Muscle weakness (generalized)     Problem List Patient Active Problem List   Diagnosis Date Noted  . Hallucination, visual 01/17/2020  . Burn 01/17/2020  . Encounter for power mobility device assessment 11/24/2019  . Pain in joint of right shoulder 11/11/2019  . Nontraumatic incomplete tear of right rotator cuff   . CKD (chronic kidney disease) stage 3, GFR 30-59 ml/min 10/18/2019  . 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 Skin rash 06/30/2019  . Impingement syndrome of right shoulder 06/22/2019  . Urinary incontinence 05/13/2018  . Nocturnal hypoxia 06/12/2017  . Morbid obesity with BMI of 40.0-44.9, adult ([place]) 03/26/2017  . Toe amputation status, right 01/16/2017  . Thrombocytopenia ([place]) 06/05/2016  . 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 Type 2 diabetes with nephropathy ([place]) 09/07/2015  . Uncontrolled diabetes mellitus with retinopathy, due to underlying condition, without macular edema ([place]) 09/05/2015  . Diabetic retinopathy ([place]) 09/05/2015  . Counseling regarding end of life decision making 06/14/2015  . Atherosclerosis of aorta ([place]) 04/04/2015  . Anemia  10/05/2014  . Chronic diastolic heart failure ([place])   . Hx of BKA, left ([place])   . Tobacco abuse   . Severe obesity (BMI >= 40) ([place]) 03/02/2013  . Abnormality of gait 03/01/2013  . Healthcare maintenance 07/10/2012  . Chronic prescription opiate use 12/03/2011  . Secondary diabetes mellitus with peripheral vascular disease ([place]) 08/27/2011  . 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 ([place]) 11/29/2009  . Hypertension associated with diabetes ([place]) 11/29/2009  . Chronic insomnia 10/25/2009  . GASTROESOPHAGEAL REFLUX DISEASE 11/24/2008  . Depression, major, severe recurrence ([place]) 04/06/2008  . DM (diabetes mellitus) type II uncontrolled, periph vascular disorder ([place]) 04/02/2007  . Hyperlipidemia associated with type 2 diabetes mellitus ([place]) 01/08/2007  . COPD exacerbation ([place]) 01/08/2007          Laureen Abrahams, PT, DPT 03/27/20 4:05 PM     [facility] [address] [place], [place], [address] Phone: [phone]   Fax:  [phone]  Name: Jennifer [name] MRN: [id number] Date of Birth: 04/01/1961

## 2020-03-29 ENCOUNTER — Ambulatory Visit (INDEPENDENT_AMBULATORY_CARE_PROVIDER_SITE_OTHER): Payer: Medicare Other | Admitting: Rehabilitative and Restorative Service Providers"

## 2020-03-29 ENCOUNTER — Other Ambulatory Visit: Payer: Self-pay

## 2020-03-29 DIAGNOSIS — M25611 Stiffness of right shoulder, not elsewhere classified: Secondary | ICD-10-CM | POA: Diagnosis not present

## 2020-03-29 DIAGNOSIS — M25511 Pain in right shoulder: Secondary | ICD-10-CM | POA: Diagnosis not present

## 2020-03-29 DIAGNOSIS — M6281 Muscle weakness (generalized): Secondary | ICD-10-CM

## 2020-03-29 DIAGNOSIS — R293 Abnormal posture: Secondary | ICD-10-CM

## 2020-03-29 NOTE — Therapy (Signed)
[facility] [address] [place], [place], [address] Phone: [phone]   Fax:  [phone]  Physical Therapy Treatment  Patient Details  Name: Jennifer [name] MRN: [id number] Date of Birth: 1961/06/07 Referring Provider (PT): Suzan Slick, NP   Encounter Date: 03/29/2020  PT End of Session - 03/29/20 0500    Visit Number  5    Number of Visits  12    Date for PT Re-Evaluation  04/16/20    PT Start Time  1340    PT Stop Time  1425    PT Time Calculation (min)  45 min    Equipment Utilized During Treatment  Gait belt    Activity Tolerance  Patient tolerated treatment well    Behavior During Therapy  [facility] for tasks assessed/performed       Past Medical History:  Diagnosis Date  . Anginal pain [facility])    '3' of 10 ischemia ruled out 9/9   . Arthritis of lumbar spine   . Asthma   . Cataract   . CHF (congestive heart failure) ([place])   . 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 Chronic bronchitis ([place])    "I get it alot" (09/28/2013)  . Chronic diastolic heart failure (HCC)    grade 2 per 2D echocardiogram (01/2013)  . Chronic lower back pain   . Chronic osteomyelitis of foot (HCC)    chronic, right secondary to diabetic foot ulcers  . Chronic pain syndrome 12/03/2011   Likely secondary to depression, "fibromyalgia", neuropathy, and obesity. Lumbar MRI 2014 no sig change from prior (2008) : Stable hypertrophic facet disease most notable at L4-5. Stable shallow left foraminal/extraforaminal disc protrusion at L4-5. No direct neural compression.      Marland Kitchen COPD 01/08/2007   PFT's 05/2007 : FEV1/FVC 82, FEV1 64% pred, FEF 25-75% 40% predicted, 16% improvement in FEV1 with bronchodilators.     . Depression   . Diabetes mellitus without complication ([place])    Type II  . Diabetic peripheral neuropathy ([place])   . DVT of upper extremity (deep vein thrombosis) ([place]) 03/11/2013   Secondary to PICC line. Right brachial vein, diagnosed on 03/10/2013  Coumadin for 3 months. End date 06/10/2013   . Dyspnea    "smoker"  . Environmental allergies    Hx: of  . Exertional shortness of breath   . Fatty liver 2003   observed on ultrasound abdomen  . Fibromyalgia   . GERD (gastroesophageal reflux disease)   . Glaucoma   . History of use of hearing aid   . Hyperlipidemia   . Hyperplastic colon polyp 12/2010   Per colonoscopy (12/2010) - Dr. Deatra Ina  . Hypertension   . 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 Blood cultures 01/26/13 S. Aureus and GBS. Blood cultures Feb 6th, 8th, and 9th and March were negative.Repeat TEE 3/20 negative for vegitations  . 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 2/2 tobacco use, DM, and cont weight bearing on surgical wound and developed gangrene   . Pneumonia   . Polymicrobial bacterial infection 01/2013   GBS and S. aureus bacteremia // Source likely infected diabetic foot ulcer  . PVD (peripheral vascular disease) with claudication ([place])    Stents to bilateral common iliac arteries (left 2005, right 2008), on chronic plavix  . Rheumatoid arthritis ([place])   . S/P BKA (below knee  amputation) unilateral [facility])    Oct 2014 L - failed limb preserving treatment. 2/2 tobacco use, DM, and cont weight bearing on surgical wound and developed gangrene   . Tobacco abuse   . Type II diabetes mellitus with peripheral circulatory disorders, uncontrolled DX: 1993   Insulin dep. Poor control. Complicated by diabetic foot ulcer and diabetic eye disease.    Marland Kitchen Ulcer of foot, chronic (HCC)    Left. No OM per MRI (01/2013)    Past Surgical History:  Procedure Laterality Date  . ABDOMINAL HYSTERECTOMY  1997   secondary to uterine fibroids  . AMPUTATION Left 08/31/2013   Procedure: AMPUTATION RAY;  Surgeon: Newt Minion, MD;  Location: [place];  Service: Orthopedics;  Laterality: Left;  Left Foot 5th Ray  Amputation  . AMPUTATION Left 09/28/2013   Procedure: Left Midfoot amputation;  Surgeon: Newt Minion, MD;  Location: [place];  Service: Orthopedics;  Laterality: Left;  Left Midfoot amputation  . AMPUTATION Left 10/14/2013   Procedure: AMPUTATION BELOW KNEE- left;  Surgeon: Newt Minion, MD;  Location: [place];  Service: Orthopedics;  Laterality: Left;  Left Below Knee Amputation   . AMPUTATION TOE Right 01/15/2017   Procedure: AMPUTATION 5th TOE RIGHT FOOT;  Surgeon: Edrick Kins, DPM;  Location: [place];  Service: Podiatry;  Laterality: Right;  . APPLICATION OF WOUND VAC  04/01/2019   Procedure: Application Of Wound Vac;  Surgeon: Newt Minion, MD;  Location: [place];  Service: Orthopedics;;  . BLADDER SURGERY     bladder reconstruction surgery  . BREAST BIOPSY     multiple-benign per pt  . COLONOSCOPY    . ESOPHAGOGASTRODUODENOSCOPY N/A 09/20/2013   Procedure: ESOPHAGOGASTRODUODENOSCOPY (EGD);  Surgeon: Jerene Bears, MD;  Location: [place];  Service: Gastroenterology;  Laterality: N/A;  . FOOT AMPUTATION THROUGH METATARSAL Left 09/28/2013  . GANGLION CYST EXCISION     multiple  . PERIPHERAL VASCULAR INTERVENTION     stents in lower ext  . SHOULDER ARTHROSCOPY Right 11/11/2019   RIGHT SHOULDER ARTHROSCOPY AND DEBRIDEMENT   . SHOULDER ARTHROSCOPY Right 11/11/2019   Procedure: RIGHT SHOULDER ARTHROSCOPY AND DEBRIDEMENT;  Surgeon: Newt Minion, MD;  Location: [place];  Service: Orthopedics;  Laterality: Right;  . SHOULDER ARTHROSCOPY W/ ROTATOR CUFF REPAIR Bilateral   . SKIN SPLIT GRAFT Bilateral 05/13/2013   Procedure: Right and Left Foot Allograft Skin Graft;  Surgeon: Newt Minion, MD;  Location: [place];  Service: Orthopedics;  Laterality: Bilateral;  Right and Left Foot Allograft Skin Graft  . STUMP REVISION Left 04/01/2019   Procedure: REVISION LEFT BELOW KNEE AMPUTATION;  Surgeon: Newt Minion, MD;  Location: [place];  Service: Orthopedics;  Laterality: Left;  . TEE WITHOUT  CARDIOVERSION N/A 01/31/2013   Procedure: TRANSESOPHAGEAL ECHOCARDIOGRAM (TEE);  Surgeon: Fay Records, MD;  Location: [place];  Service: Cardiovascular;  Laterality: N/A;  Rm [phone]  . TEE WITHOUT CARDIOVERSION N/A 03/10/2013   Procedure: TRANSESOPHAGEAL ECHOCARDIOGRAM (TEE);  Surgeon: Larey Dresser, MD;  Location: [place];  Service: Cardiovascular;  Laterality: N/A;  Rm. [phone]  . TOE AMPUTATION Left 08/31/2013   4TH & 5 TH TOE   . TONSILLECTOMY    . TUBAL LIGATION    . WRIST SURGERY Right    "for tumors" (09/28/2013)    There were no vitals filed for this visit.  Subjective Assessment - 03/29/20 1344    Subjective  Pt. stated Rt shoulder/neck hurting constant at 7/10 or so  today.    Pertinent History  Lt BKA, OA, asthma, CHF, chronic pain, depression, DM, PVD, RA, tobacco abuse    Limitations  Lifting    Patient Stated Goals  reach behind back    Currently in Pain?  Yes    Pain Score  7     Pain Location  Shoulder    Pain Orientation  Right    Pain Descriptors / Indicators  Aching    Pain Onset  More than a month ago    Pain Frequency  Constant                       [facility] Adult PT Treatment/Exercise - 03/29/20 0001      Shoulder Exercises: Supine   Horizontal ABduction  Strengthening;Both;20 reps    Theraband Level (Shoulder Horizontal ABduction)  Level 3 (Green)    Other Supine Exercises  supine 90 deg stablization holds c mild external resistance 30 sec x 2      Shoulder Exercises: Seated   Other Seated Exercises  thoracic extension in chair 2 x 10      Shoulder Exercises: Standing   Other Standing Exercises  standing ipsilateral step to c arm ranger flexion x 20   gait belt c CGA     Moist Heat Therapy   Number Minutes Moist Heat  5 Minutes    Moist Heat Location  Shoulder      Manual Therapy   Manual therapy comments  STM, compression c movement to Rt upper trap, infraspinatus, pec minor and subscap             PT Education -  03/29/20 1423    Education Details  Cues on intervention.  Education on thoracolumbar involvement on shoulder mobility.    Person(s) Educated  Patient    Methods  Explanation;Demonstration;Verbal cues    Comprehension  Verbalized understanding;Returned demonstration          PT Long Term Goals - 03/21/20 1356      PT LONG TERM GOAL #1   Title  independent with HEP    Time  2    Period  Months    Status  On-going    Target Date  04/16/20      PT LONG TERM GOAL #2   Title  improve functional external rotation to back of head for improved function    Period  Months    Status  On-going      PT LONG TERM GOAL #3   Title  improve functional internal rotation to at least L2/3 for improved function    Time  2    Period  Months    Status  On-going      PT LONG TERM GOAL #4   Title  report pain < 4/10 with activity for improved function and decreased pain    Time  2    Period  Months    Status  On-going      PT LONG TERM GOAL #5   Title  Rt shoulder strength at least 4/5 for improved function    Time  2    Period  Months    Status  On-going            Plan - 03/29/20 1423    Clinical Impression Statement  Numerous myofascial restriction c TrP noted throughout R shoulder and periscapular areas.  Thoracolumbar mobility restrictions creating biomechanical limitation in RUE mobility.    Personal Factors and  Comorbidities  Age;Comorbidity 3+    Comorbidities  Lt BKA, OA, asthma, CHF, chronic pain, depression, DM, PVD, RA, tobacco abuse    Examination-Activity Limitations  Bathing;Dressing;Hygiene/Grooming;Toileting;Reach Overhead    Examination-Participation Restrictions  Cleaning    Stability/Clinical Decision Making  Evolving/Moderate complexity    Rehab Potential  Good    PT Frequency  2x / week    PT Duration  6 weeks    PT Treatment/Interventions  ADLs/Self Care Home Management;Cryotherapy;Electrical Stimulation;Ultrasound;Moist Heat;Iontophoresis 4mg /ml  Dexamethasone;Functional mobility training;Therapeutic activities;Therapeutic exercise;Patient/family education;Neuromuscular re-education;Manual techniques;Taping;Vasopneumatic Device;Dry needling;Passive range of motion    PT Next Visit Plan  Improve thoracolumbar, scapular mobility for GH mobility improvements.    PT Home Exercise Plan  Access Code: [email or web address] and Agree with Plan of Care  Patient       Patient will benefit from skilled therapeutic intervention in order to improve the following deficits and impairments:  Pain, Postural dysfunction, Decreased range of motion, Decreased strength, Decreased endurance  Visit Diagnosis: Acute pain of right shoulder  Stiffness of right shoulder, not elsewhere classified  Abnormal posture  Muscle weakness (generalized)     Problem List Patient Active Problem List   Diagnosis Date Noted  . Hallucination, visual 01/17/2020  . Burn 01/17/2020  . Encounter for power mobility device assessment 11/24/2019  . Pain in joint of right shoulder 11/11/2019  . Nontraumatic incomplete tear of right rotator cuff   . CKD (chronic kidney disease) stage 3, GFR 30-59 ml/min 10/18/2019  . 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 Skin rash 06/30/2019  . Impingement syndrome of right shoulder 06/22/2019  . Urinary incontinence 05/13/2018  . Nocturnal hypoxia 06/12/2017  . Morbid obesity with BMI of 40.0-44.9, adult ([place]) 03/26/2017  . Toe amputation status, right 01/16/2017  . Thrombocytopenia ([place]) 06/05/2016  . 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 Type 2 diabetes with nephropathy ([place]) 09/07/2015  . Uncontrolled diabetes mellitus with retinopathy, due to underlying condition, without macular edema ([place]) 09/05/2015  . Diabetic retinopathy ([place]) 09/05/2015  . Counseling regarding end of life decision making 06/14/2015  . Atherosclerosis of aorta ([place]) 04/04/2015  . Anemia 10/05/2014  . Chronic diastolic heart failure ([place])   . Hx of BKA, left ([place])   .  Tobacco abuse   . Severe obesity (BMI >= 40) ([place]) 03/02/2013  . Abnormality of gait 03/01/2013  . Healthcare maintenance 07/10/2012  . Chronic prescription opiate use 12/03/2011  . Secondary diabetes mellitus with peripheral vascular disease ([place]) 08/27/2011  . 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 ([place]) 11/29/2009  . Hypertension associated with diabetes ([place]) 11/29/2009  . Chronic insomnia 10/25/2009  . GASTROESOPHAGEAL REFLUX DISEASE 11/24/2008  . Depression, major, severe recurrence ([place]) 04/06/2008  . DM (diabetes mellitus) type II uncontrolled, periph vascular disorder ([place]) 04/02/2007  . Hyperlipidemia associated with type 2 diabetes mellitus ([place]) 01/08/2007  . COPD exacerbation ([place]) 01/08/2007   Scot Jun, PT, DPT, OCS, ATC 03/29/20  2:25 PM    [facility] [address] [place], [place], [address] Phone: [phone]   Fax:  [phone]  Name: Jennifer [name] MRN: [id number] Date of Birth: May 05, 1961

## 2020-03-30 ENCOUNTER — Ambulatory Visit: Payer: Medicare Other | Admitting: *Deleted

## 2020-03-30 DIAGNOSIS — E08319 Diabetes mellitus due to underlying condition with unspecified diabetic retinopathy without macular edema: Secondary | ICD-10-CM

## 2020-03-30 DIAGNOSIS — I5032 Chronic diastolic (congestive) heart failure: Secondary | ICD-10-CM

## 2020-03-30 DIAGNOSIS — IMO0002 Reserved for concepts with insufficient information to code with codable children: Secondary | ICD-10-CM

## 2020-03-30 NOTE — Chronic Care Management (AMB) (Signed)
  Chronic Care Management   Outreach Note  03/30/2020 Name: Jennifer [name] MRN: [id number] DOB: 1961-01-27  Referred by: Bartholomew Crews, MD Reason for referral : Chronic Care Management (IDDM, HF, COPD, RA)   A successful telephone outreach was attempted today to complete follow up assessment however patient was not able to talk was she was grocery shopping. She agreed to a follow up call next week.   Follow Up Plan: The care management team will reach out to the patient again over the next 7 days.   Kelli Churn RN, CCM, [facility] RN Care Manager [phone]

## 2020-04-02 ENCOUNTER — Encounter: Payer: Self-pay | Admitting: Physical Therapy

## 2020-04-02 ENCOUNTER — Ambulatory Visit (HOSPITAL_COMMUNITY)
Admission: RE | Admit: 2020-04-02 | Discharge: 2020-04-02 | Disposition: A | Discharge status: Home / Self Care | Payer: Medicare Other | Source: Ambulatory Visit | Attending: Internal Medicine | Admitting: Internal Medicine

## 2020-04-02 ENCOUNTER — Ambulatory Visit: Payer: Medicare Other

## 2020-04-02 ENCOUNTER — Other Ambulatory Visit: Payer: Self-pay

## 2020-04-02 ENCOUNTER — Ambulatory Visit (INDEPENDENT_AMBULATORY_CARE_PROVIDER_SITE_OTHER): Payer: Medicare Other | Admitting: Physical Therapy

## 2020-04-02 DIAGNOSIS — R29818 Other symptoms and signs involving the nervous system: Secondary | ICD-10-CM | POA: Diagnosis not present

## 2020-04-02 DIAGNOSIS — M25511 Pain in right shoulder: Secondary | ICD-10-CM | POA: Diagnosis not present

## 2020-04-02 DIAGNOSIS — M25611 Stiffness of right shoulder, not elsewhere classified: Secondary | ICD-10-CM | POA: Diagnosis not present

## 2020-04-02 DIAGNOSIS — IMO0002 Reserved for concepts with insufficient information to code with codable children: Secondary | ICD-10-CM

## 2020-04-02 DIAGNOSIS — R293 Abnormal posture: Secondary | ICD-10-CM | POA: Diagnosis not present

## 2020-04-02 DIAGNOSIS — E1159 Type 2 diabetes mellitus with other circulatory complications: Secondary | ICD-10-CM

## 2020-04-02 DIAGNOSIS — E1151 Type 2 diabetes mellitus with diabetic peripheral angiopathy without gangrene: Secondary | ICD-10-CM

## 2020-04-02 DIAGNOSIS — M6281 Muscle weakness (generalized): Secondary | ICD-10-CM

## 2020-04-02 MED ORDER — GADOBUTROL 1 MMOL/ML IV SOLN
10.0000 mL | Freq: Once | INTRAVENOUS | Status: AC | PRN
  Administered 2020-04-02: 10 mL via INTRAVENOUS

## 2020-04-02 NOTE — Chronic Care Management (AMB) (Signed)
  Care Management   Follow Up Note   04/02/2020 Name: Jennifer [name] MRN: [id number] DOB: 01/26/1961  Referred by: Bartholomew Crews, MD Reason for referral : Care Coordination ([place])   Jennifer [name] is a 59 y.o. year old female who is a primary care patient of Bartholomew Crews, MD. The care management team was consulted for assistance with care management and care coordination needs.    Review of patient status, including review of consultants reports, relevant laboratory and other test results, and collaboration with appropriate care team members and the patient's provider was performed as part of comprehensive patient evaluation and provision of chronic care management services.    SDOH (Social Determinants of Health) assessments performed: No See Care Plan activities for detailed interventions related to [facility])     Advanced Directives: See Care Plan and Vynca application for related entries.   Goals Addressed            This Visit's Progress   . COMPLETED: "I need a power wheelchair" (pt-stated)       [place] (see longtitudinal plan of care for additional care plan information)  Current Barriers:  . ADL IADL limitations  Clinical Social Work Clinical Goal(s):  Marland Kitchen Over the next 30 days, patient will work with SW to address concerns related to need for DME; power wheelchair  Interventions: . Talked with patient about status of power wheelchair.  Patient reports that [facility] DME department overturned denial for power wheelchair. States that she should receive DME within the next 2-3 weeks.     . Patient Self Care Activities:  . Performs ADL's independently . Unable to perform IADLs independently  Please see past updates related to this goal by clicking on the "Past Updates" button in the selected goal         Attempted to follow up with patient about Senior Living options provided during last office visit but she needed to leave for an appointment.        Telephone follow up appointment with care management team member scheduled for:04/16/20      Ronn Melena, [place] Coordination Social Worker [address] [phone]

## 2020-04-02 NOTE — Therapy (Signed)
[facility] [address] [place], [place], [address] Phone: [phone]   Fax:  [phone]  Physical Therapy Treatment  Patient Details  Name: Jennifer [name] MRN: [id number] Date of Birth: [birth year] Referring Provider (PT): Suzan Slick, NP   Encounter Date: 04/02/2020  PT End of Session - 04/02/20 1521    Visit Number  6    Number of Visits  12    Date for PT Re-Evaluation  04/16/20    PT Start Time  [id number]    PT Stop Time  1445    PT Time Calculation (min)  50 min    Equipment Utilized During Treatment  Gait belt    Activity Tolerance  Patient tolerated treatment well    Behavior During Therapy  [facility] for tasks assessed/performed       Past Medical History:  Diagnosis Date  . Anginal pain [facility])    '3' of 10 ischemia ruled out 9/9   . Arthritis of lumbar spine   . Asthma   . Cataract   . CHF (congestive heart failure) ([place])   . 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 Chronic bronchitis ([place])    "I get it alot" (09/28/2013)  . Chronic diastolic heart failure (HCC)    grade 2 per 2D echocardiogram (01/2013)  . Chronic lower back pain   . Chronic osteomyelitis of foot (HCC)    chronic, right secondary to diabetic foot ulcers  . Chronic pain syndrome 12/03/2011   Likely secondary to depression, "fibromyalgia", neuropathy, and obesity. Lumbar MRI 2014 no sig change from prior (2008) : Stable hypertrophic facet disease most notable at L4-5. Stable shallow left foraminal/extraforaminal disc protrusion at L4-5. No direct neural compression.      Marland Kitchen COPD 01/08/2007   PFT's 05/2007 : FEV1/FVC 82, FEV1 64% pred, FEF 25-75% 40% predicted, 16% improvement in FEV1 with bronchodilators.     . Depression   . Diabetes mellitus without complication ([place])    Type II  . Diabetic peripheral neuropathy ([place])   . DVT of upper extremity (deep vein thrombosis) ([place]) 03/11/2013   Secondary to PICC line. Right brachial vein, diagnosed on  03/10/2013 Coumadin for 3 months. End date 06/10/2013   . Dyspnea    "smoker"  . Environmental allergies    Hx: of  . Exertional shortness of breath   . Fatty liver 2003   observed on ultrasound abdomen  . Fibromyalgia   . GERD (gastroesophageal reflux disease)   . Glaucoma   . History of use of hearing aid   . Hyperlipidemia   . Hyperplastic colon polyp 12/2010   Per colonoscopy (12/2010) - Dr. Deatra Ina  . Hypertension   . 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 Blood cultures 01/26/13 S. Aureus and GBS. Blood cultures Feb 6th, 8th, and 9th and March were negative.Repeat TEE 3/20 negative for vegitations  . 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 2/2 tobacco use, DM, and cont weight bearing on surgical wound and developed gangrene   . Pneumonia   . Polymicrobial bacterial infection 01/2013   GBS and S. aureus bacteremia // Source likely infected diabetic foot ulcer  . PVD (peripheral vascular disease) with claudication ([place])    Stents to bilateral common iliac arteries (left 2005, right 2008), on chronic plavix  . Rheumatoid arthritis ([place])   . S/P BKA (below knee  amputation) unilateral [facility])    Oct 2014 L - failed limb preserving treatment. 2/2 tobacco use, DM, and cont weight bearing on surgical wound and developed gangrene   . Tobacco abuse   . Type II diabetes mellitus with peripheral circulatory disorders, uncontrolled DX: 1993   Insulin dep. Poor control. Complicated by diabetic foot ulcer and diabetic eye disease.    Marland Kitchen Ulcer of foot, chronic (HCC)    Left. No OM per MRI (01/2013)    Past Surgical History:  Procedure Laterality Date  . ABDOMINAL HYSTERECTOMY  1997   secondary to uterine fibroids  . AMPUTATION Left 08/31/2013   Procedure: AMPUTATION RAY;  Surgeon: Newt Minion, MD;  Location: [place];  Service: Orthopedics;  Laterality: Left;  Left Foot  5th Ray Amputation  . AMPUTATION Left 09/28/2013   Procedure: Left Midfoot amputation;  Surgeon: Newt Minion, MD;  Location: [place];  Service: Orthopedics;  Laterality: Left;  Left Midfoot amputation  . AMPUTATION Left 10/14/2013   Procedure: AMPUTATION BELOW KNEE- left;  Surgeon: Newt Minion, MD;  Location: [place];  Service: Orthopedics;  Laterality: Left;  Left Below Knee Amputation   . AMPUTATION TOE Right 01/15/2017   Procedure: AMPUTATION 5th TOE RIGHT FOOT;  Surgeon: Edrick Kins, DPM;  Location: [place];  Service: Podiatry;  Laterality: Right;  . APPLICATION OF WOUND VAC  04/01/2019   Procedure: Application Of Wound Vac;  Surgeon: Newt Minion, MD;  Location: [place];  Service: Orthopedics;;  . BLADDER SURGERY     bladder reconstruction surgery  . BREAST BIOPSY     multiple-benign per pt  . COLONOSCOPY    . ESOPHAGOGASTRODUODENOSCOPY N/A 09/20/2013   Procedure: ESOPHAGOGASTRODUODENOSCOPY (EGD);  Surgeon: Jerene Bears, MD;  Location: [place];  Service: Gastroenterology;  Laterality: N/A;  . FOOT AMPUTATION THROUGH METATARSAL Left 09/28/2013  . GANGLION CYST EXCISION     multiple  . PERIPHERAL VASCULAR INTERVENTION     stents in lower ext  . SHOULDER ARTHROSCOPY Right 11/11/2019   RIGHT SHOULDER ARTHROSCOPY AND DEBRIDEMENT   . SHOULDER ARTHROSCOPY Right 11/11/2019   Procedure: RIGHT SHOULDER ARTHROSCOPY AND DEBRIDEMENT;  Surgeon: Newt Minion, MD;  Location: [place];  Service: Orthopedics;  Laterality: Right;  . SHOULDER ARTHROSCOPY W/ ROTATOR CUFF REPAIR Bilateral   . SKIN SPLIT GRAFT Bilateral 05/13/2013   Procedure: Right and Left Foot Allograft Skin Graft;  Surgeon: Newt Minion, MD;  Location: [place];  Service: Orthopedics;  Laterality: Bilateral;  Right and Left Foot Allograft Skin Graft  . STUMP REVISION Left 04/01/2019   Procedure: REVISION LEFT BELOW KNEE AMPUTATION;  Surgeon: Newt Minion, MD;  Location: [place];  Service: Orthopedics;  Laterality: Left;  . TEE WITHOUT  CARDIOVERSION N/A 01/31/2013   Procedure: TRANSESOPHAGEAL ECHOCARDIOGRAM (TEE);  Surgeon: Fay Records, MD;  Location: [place];  Service: Cardiovascular;  Laterality: N/A;  Rm [phone]  . TEE WITHOUT CARDIOVERSION N/A 03/10/2013   Procedure: TRANSESOPHAGEAL ECHOCARDIOGRAM (TEE);  Surgeon: Larey Dresser, MD;  Location: [place];  Service: Cardiovascular;  Laterality: N/A;  Rm. 4730  . TOE AMPUTATION Left 08/31/2013   4TH & 5 TH TOE   . TONSILLECTOMY    . TUBAL LIGATION    . WRIST SURGERY Right    "for tumors" (09/28/2013)    There were no vitals filed for this visit.  Subjective Assessment - 04/02/20 1400    Subjective  doing well; shoulder is still sore, but feeling better.  Pertinent History  Lt BKA, OA, asthma, CHF, chronic pain, depression, DM, PVD, RA, tobacco abuse    Limitations  Lifting    Patient Stated Goals  reach behind back    Currently in Pain?  Yes    Pain Score  5     Pain Location  Shoulder    Pain Orientation  Right    Pain Descriptors / Indicators  Aching    Pain Type  Acute pain;Surgical pain;Chronic pain    Pain Onset  More than a month ago    Pain Frequency  Constant    Aggravating Factors   lifting arm    Pain Relieving Factors  medication                       [facility] Adult PT Treatment/Exercise - 04/02/20 1401      Shoulder Exercises: Supine   Horizontal ABduction  Strengthening;Both;20 reps    Theraband Level (Shoulder Horizontal ABduction)  Level 3 (Green)    External Rotation  Both;20 reps;Theraband    Theraband Level (Shoulder External Rotation)  Level 2 (Red)    Flexion  Right;20 reps;Weights    Shoulder Flexion Weight (lbs)  2    Other Supine Exercises  scap retraction 20 x 5 sec      Shoulder Exercises: Pulleys   Flexion  2 minutes    Scaption  2 minutes      Shoulder Exercises: Stretch   Internal Rotation Stretch  10 seconds   10 reps     Moist Heat Therapy   Number Minutes Moist Heat  10 Minutes    Moist Heat  Location  Shoulder      Manual Therapy   Manual Therapy  Passive ROM    Passive ROM  Rt shoulder flexion, abduction, er/ir in varying degrees of abduction                  PT Long Term Goals - 03/21/20 1356      PT LONG TERM GOAL #1   Title  independent with HEP    Time  2    Period  Months    Status  On-going    Target Date  04/16/20      PT LONG TERM GOAL #2   Title  improve functional external rotation to back of head for improved function    Period  Months    Status  On-going      PT LONG TERM GOAL #3   Title  improve functional internal rotation to at least L2/3 for improved function    Time  2    Period  Months    Status  On-going      PT LONG TERM GOAL #4   Title  report pain < 4/10 with activity for improved function and decreased pain    Time  2    Period  Months    Status  On-going      PT LONG TERM GOAL #5   Title  Rt shoulder strength at least 4/5 for improved function    Time  2    Period  Months    Status  On-going            Plan - 04/02/20 1521    Clinical Impression Statement  Pt still c/o increased pain with end range external rotation and functional internal rotation.  End range is improving at this time and functional progress improving.  Pt may not  regain full mobility due to hx of multiple surgeries and tissue quality.  Will continue to benefit from PT to maximize function.    Personal Factors and Comorbidities  Age;Comorbidity 3+    Comorbidities  Lt BKA, OA, asthma, CHF, chronic pain, depression, DM, PVD, RA, tobacco abuse    Examination-Activity Limitations  Bathing;Dressing;Hygiene/Grooming;Toileting;Reach Overhead    Examination-Participation Restrictions  Cleaning    Stability/Clinical Decision Making  Evolving/Moderate complexity    Rehab Potential  Good    PT Frequency  2x / week    PT Duration  6 weeks    PT Treatment/Interventions  ADLs/Self Care Home Management;Cryotherapy;Electrical Stimulation;Ultrasound;Moist  Heat;Iontophoresis 4mg /ml Dexamethasone;Functional mobility training;Therapeutic activities;Therapeutic exercise;Patient/family education;Neuromuscular re-education;Manual techniques;Taping;Vasopneumatic Device;Dry needling;Passive range of motion    PT Next Visit Plan  Improve thoracolumbar, scapular mobility for GH mobility improvements. work on Hotel manager    PT Home Exercise Plan  Access Code: [email or web address] and Agree with Plan of Care  Patient       Patient will benefit from skilled therapeutic intervention in order to improve the following deficits and impairments:  Pain, Postural dysfunction, Decreased range of motion, Decreased strength, Decreased endurance  Visit Diagnosis: Acute pain of right shoulder  Stiffness of right shoulder, not elsewhere classified  Abnormal posture  Muscle weakness (generalized)     Problem List Patient Active Problem List   Diagnosis Date Noted  . Hallucination, visual 01/17/2020  . Burn 01/17/2020  . Encounter for power mobility device assessment 11/24/2019  . Pain in joint of right shoulder 11/11/2019  . Nontraumatic incomplete tear of right rotator cuff   . CKD (chronic kidney disease) stage 3, GFR 30-59 ml/min 10/18/2019  . 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 Skin rash 06/30/2019  . Impingement syndrome of right shoulder 06/22/2019  . Urinary incontinence 05/13/2018  . Nocturnal hypoxia 06/12/2017  . Morbid obesity with BMI of 40.0-44.9, adult ([place]) 03/26/2017  . Toe amputation status, right 01/16/2017  . Thrombocytopenia ([place]) 06/05/2016  . 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 Type 2 diabetes with nephropathy ([place]) 09/07/2015  . Uncontrolled diabetes mellitus with retinopathy, due to underlying condition, without macular edema ([place]) 09/05/2015  . Diabetic retinopathy ([place]) 09/05/2015  . Counseling regarding end of life decision making 06/14/2015  . Atherosclerosis of aorta ([place]) 04/04/2015  . Anemia 10/05/2014  . Chronic diastolic heart  failure ([place])   . Hx of BKA, left ([place])   . Tobacco abuse   . Severe obesity (BMI >= 40) ([place]) 03/02/2013  . Abnormality of gait 03/01/2013  . Healthcare maintenance 07/10/2012  . Chronic prescription opiate use 12/03/2011  . Secondary diabetes mellitus with peripheral vascular disease ([place]) 08/27/2011  . 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 ([place]) 11/29/2009  . Hypertension associated with diabetes ([place]) 11/29/2009  . Chronic insomnia 10/25/2009  . GASTROESOPHAGEAL REFLUX DISEASE 11/24/2008  . Depression, major, severe recurrence ([place]) 04/06/2008  . DM (diabetes mellitus) type II uncontrolled, periph vascular disorder ([place]) 04/02/2007  . Hyperlipidemia associated with type 2 diabetes mellitus ([place]) 01/08/2007  . COPD exacerbation ([place]) 01/08/2007      Laureen Abrahams, PT, DPT 04/02/20 3:23 PM    [facility] [address] [place], [place], [address] Phone: [phone]   Fax:  [phone]  Name: Jennifer [name] MRN: [id number] Date of Birth: Mar 25, 1961

## 2020-04-02 NOTE — Progress Notes (Signed)
Internal Medicine Clinic Resident  I have personally reviewed this encounter including the documentation in this note and/or discussed this patient with the care management provider. I will address any urgent items identified by the care management provider and will communicate my actions to the patient's PCP. I have reviewed the patient's CCM visit with my supervising attending.  Ladona Horns, MD 04/02/2020

## 2020-04-02 NOTE — Progress Notes (Signed)
Internal Medicine Clinic Attending  CCM services provided by the care management provider and their documentation were discussed with Dr. Koleen Distance. We reviewed the pertinent findings, urgent action items addressed by the resident and non-urgent items to be addressed by the PCP.  I agree with the assessment, diagnosis, and plan of care documented in the CCM and resident's note.  Larey Dresser, MD 04/02/2020

## 2020-04-02 NOTE — Patient Instructions (Signed)
Visit Information  Goals Addressed            This Visit's Progress   . COMPLETED: "I need a power wheelchair" (pt-stated)       [place] (see longtitudinal plan of care for additional care plan information)  Current Barriers:  . ADL IADL limitations  Clinical Social Work Clinical Goal(s):  Marland Kitchen Over the next 30 days, patient will work with SW to address concerns related to need for DME; power wheelchair  Interventions: . Talked with patient about status of power wheelchair.  Patient reports that [facility] DME department overturned denial for power wheelchair. States that she should receive DME within the next 2-3 weeks.     . Patient Self Care Activities:  . Performs ADL's independently . Unable to perform IADLs independently  Please see past updates related to this goal by clicking on the "Past Updates" button in the selected goal         Patient verbalizes understanding of instructions provided today.   Telephone follow up appointment with care management team member scheduled for:04/16/20    Ronn Melena, [place] Coordination Social Worker [place] [phone]

## 2020-04-04 ENCOUNTER — Telehealth: Payer: Medicare Other

## 2020-04-05 ENCOUNTER — Ambulatory Visit (INDEPENDENT_AMBULATORY_CARE_PROVIDER_SITE_OTHER): Payer: Medicare Other | Admitting: Physical Therapy

## 2020-04-05 ENCOUNTER — Other Ambulatory Visit: Payer: Self-pay

## 2020-04-05 DIAGNOSIS — M25511 Pain in right shoulder: Secondary | ICD-10-CM | POA: Diagnosis not present

## 2020-04-05 DIAGNOSIS — M6281 Muscle weakness (generalized): Secondary | ICD-10-CM

## 2020-04-05 DIAGNOSIS — R293 Abnormal posture: Secondary | ICD-10-CM | POA: Diagnosis not present

## 2020-04-05 DIAGNOSIS — M25611 Stiffness of right shoulder, not elsewhere classified: Secondary | ICD-10-CM | POA: Diagnosis not present

## 2020-04-05 NOTE — Therapy (Addendum)
[facility] Physical Therapy [address] [place], [place], [address] Phone: [phone]   Fax:  [phone]  Physical Therapy Treatment  Patient Details  Name: Jennifer [name] MRN: [id number] Date of Birth: [birth year] Referring Provider (PT): Suzan Slick, NP   Encounter Date: 04/05/2020  PT End of Session - 04/05/20 1457    Visit Number  7    Number of Visits  12    Date for PT Re-Evaluation  04/16/20    PT Start Time  1450    PT Stop Time  1525    PT Time Calculation (min)  35 min    Equipment Utilized During Treatment  Gait belt    Activity Tolerance  Patient tolerated treatment well    Behavior During Therapy  [facility] for tasks assessed/performed       Past Medical History:  Diagnosis Date  . Anginal pain [facility])    '3' of 10 ischemia ruled out 9/9   . Arthritis of lumbar spine   . Asthma   . Cataract   . CHF (congestive heart failure) ([place])   . 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 Chronic bronchitis ([place])    "I get it alot" (09/28/2013)  . Chronic diastolic heart failure (HCC)    grade 2 per 2D echocardiogram (01/2013)  . Chronic lower back pain   . Chronic osteomyelitis of foot (HCC)    chronic, right secondary to diabetic foot ulcers  . Chronic pain syndrome 12/03/2011   Likely secondary to depression, "fibromyalgia", neuropathy, and obesity. Lumbar MRI 2014 no sig change from prior (2008) : Stable hypertrophic facet disease most notable at L4-5. Stable shallow left foraminal/extraforaminal disc protrusion at L4-5. No direct neural compression.      Marland Kitchen COPD 01/08/2007   PFT's 05/2007 : FEV1/FVC 82, FEV1 64% pred, FEF 25-75% 40% predicted, 16% improvement in FEV1 with bronchodilators.     . Depression   . Diabetes mellitus without complication ([place])    Type II  . Diabetic peripheral neuropathy ([place])   . DVT of upper extremity (deep vein thrombosis) ([place]) 03/11/2013   Secondary to PICC line. Right brachial vein, diagnosed on  03/10/2013 Coumadin for 3 months. End date 06/10/2013   . Dyspnea    "smoker"  . Environmental allergies    Hx: of  . Exertional shortness of breath   . Fatty liver 2003   observed on ultrasound abdomen  . Fibromyalgia   . GERD (gastroesophageal reflux disease)   . Glaucoma   . History of use of hearing aid   . Hyperlipidemia   . Hyperplastic colon polyp 12/2010   Per colonoscopy (12/2010) - Dr. Deatra Ina  . Hypertension   . 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 Blood cultures 01/26/13 S. Aureus and GBS. Blood cultures Feb 6th, 8th, and 9th and March were negative.Repeat TEE 3/20 negative for vegitations  . 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 2/2 tobacco use, DM, and cont weight bearing on surgical wound and developed gangrene   . Pneumonia   . Polymicrobial bacterial infection 01/2013   GBS and S. aureus bacteremia // Source likely infected diabetic foot ulcer  . PVD (peripheral vascular disease) with claudication ([place])    Stents to bilateral common iliac arteries (left 2005, right 2008), on chronic plavix  . Rheumatoid arthritis ([place])   . S/P BKA (below knee  amputation) unilateral [facility])    Oct 2014 L - failed limb preserving treatment. 2/2 tobacco use, DM, and cont weight bearing on surgical wound and developed gangrene   . Tobacco abuse   . Type II diabetes mellitus with peripheral circulatory disorders, uncontrolled DX: 1993   Insulin dep. Poor control. Complicated by diabetic foot ulcer and diabetic eye disease.    Marland Kitchen Ulcer of foot, chronic (HCC)    Left. No OM per MRI (01/2013)    Past Surgical History:  Procedure Laterality Date  . ABDOMINAL HYSTERECTOMY  1997   secondary to uterine fibroids  . AMPUTATION Left 08/31/2013   Procedure: AMPUTATION RAY;  Surgeon: Newt Minion, MD;  Location: [place];  Service: Orthopedics;  Laterality: Left;  Left Foot  5th Ray Amputation  . AMPUTATION Left 09/28/2013   Procedure: Left Midfoot amputation;  Surgeon: Newt Minion, MD;  Location: [place];  Service: Orthopedics;  Laterality: Left;  Left Midfoot amputation  . AMPUTATION Left 10/14/2013   Procedure: AMPUTATION BELOW KNEE- left;  Surgeon: Newt Minion, MD;  Location: [place];  Service: Orthopedics;  Laterality: Left;  Left Below Knee Amputation   . AMPUTATION TOE Right 01/15/2017   Procedure: AMPUTATION 5th TOE RIGHT FOOT;  Surgeon: Edrick Kins, DPM;  Location: [place];  Service: Podiatry;  Laterality: Right;  . APPLICATION OF WOUND VAC  04/01/2019   Procedure: Application Of Wound Vac;  Surgeon: Newt Minion, MD;  Location: [place];  Service: Orthopedics;;  . BLADDER SURGERY     bladder reconstruction surgery  . BREAST BIOPSY     multiple-benign per pt  . COLONOSCOPY    . ESOPHAGOGASTRODUODENOSCOPY N/A 09/20/2013   Procedure: ESOPHAGOGASTRODUODENOSCOPY (EGD);  Surgeon: Jerene Bears, MD;  Location: [place];  Service: Gastroenterology;  Laterality: N/A;  . FOOT AMPUTATION THROUGH METATARSAL Left 09/28/2013  . GANGLION CYST EXCISION     multiple  . PERIPHERAL VASCULAR INTERVENTION     stents in lower ext  . SHOULDER ARTHROSCOPY Right 11/11/2019   RIGHT SHOULDER ARTHROSCOPY AND DEBRIDEMENT   . SHOULDER ARTHROSCOPY Right 11/11/2019   Procedure: RIGHT SHOULDER ARTHROSCOPY AND DEBRIDEMENT;  Surgeon: Newt Minion, MD;  Location: [place];  Service: Orthopedics;  Laterality: Right;  . SHOULDER ARTHROSCOPY W/ ROTATOR CUFF REPAIR Bilateral   . SKIN SPLIT GRAFT Bilateral 05/13/2013   Procedure: Right and Left Foot Allograft Skin Graft;  Surgeon: Newt Minion, MD;  Location: [place];  Service: Orthopedics;  Laterality: Bilateral;  Right and Left Foot Allograft Skin Graft  . STUMP REVISION Left 04/01/2019   Procedure: REVISION LEFT BELOW KNEE AMPUTATION;  Surgeon: Newt Minion, MD;  Location: [place];  Service: Orthopedics;  Laterality: Left;  . TEE WITHOUT  CARDIOVERSION N/A 01/31/2013   Procedure: TRANSESOPHAGEAL ECHOCARDIOGRAM (TEE);  Surgeon: Fay Records, MD;  Location: [place];  Service: Cardiovascular;  Laterality: N/A;  Rm [phone]  . TEE WITHOUT CARDIOVERSION N/A 03/10/2013   Procedure: TRANSESOPHAGEAL ECHOCARDIOGRAM (TEE);  Surgeon: Larey Dresser, MD;  Location: [place];  Service: Cardiovascular;  Laterality: N/A;  Rm. [phone]  . TOE AMPUTATION Left 08/31/2013   4TH & 5 TH TOE   . TONSILLECTOMY    . TUBAL LIGATION    . WRIST SURGERY Right    "for tumors" (09/28/2013)    There were no vitals filed for this visit.  Subjective Assessment - 04/05/20 1455    Subjective  Pt reporting she is always in pain. 7/10 pain in  R shoulder.    Pertinent History  Lt BKA, OA, asthma, CHF, chronic pain, depression, DM, PVD, RA, tobacco abuse    Limitations  Lifting    Patient Stated Goals  reach behind back    Currently in Pain?  Yes    Pain Score  7     Pain Location  Shoulder    Pain Orientation  Right    Pain Descriptors / Indicators  Aching                       [facility] Adult PT Treatment/Exercise - 04/05/20 0001      Shoulder Exercises: Supine   Horizontal ABduction  Strengthening;Both;20 reps    Theraband Level (Shoulder Horizontal ABduction)  Level 3 (Green)    External Rotation  Both;20 reps;Theraband    Theraband Level (Shoulder External Rotation)  Level 2 (Red)    Flexion  Right;20 reps;Weights    Shoulder Flexion Weight (lbs)  2# wand    Other Supine Exercises  scap retraction 20 x 5 sec      Shoulder Exercises: Seated   Other Seated Exercises  ball squeezes x 15 holding 5 seconds each      Shoulder Exercises: Sidelying   Other Sidelying Exercises  serratus punches using 2# wand x 15 reps      Shoulder Exercises: Pulleys   Flexion  2 minutes    Scaption  2 minutes      Shoulder Exercises: Stretch   Internal Rotation Stretch  10 seconds   10 reps     Moist Heat Therapy   Number Minutes Moist Heat  10  Minutes    Moist Heat Location  Shoulder      Manual Therapy   Manual Therapy  Passive ROM    Manual therapy comments  5 minutes    Passive ROM  R shoulder flexion/abd/ER                  PT Long Term Goals - 04/05/20 1500      PT LONG TERM GOAL #1   Title  independent with HEP    Baseline  MET 10/08/2015    Time  2    Period  Months    Status  On-going      PT LONG TERM GOAL #2   Title  improve functional external rotation to back of head for improved function    Baseline  MET 10/08/2015    Time  2    Period  Months    Status  On-going      PT LONG TERM GOAL #3   Title  improve functional internal rotation to at least L2/3 for improved function    Baseline  10/09/16: 270 feet with 1 seated rest break, pt unable to go distance without resting.    Time  2    Period  Months    Status  On-going      PT LONG TERM GOAL #4   Title  report pain < 4/10 with activity for improved function and decreased pain    Baseline  10/10/15: 20.81 seconds with rollator    Time  2    Period  Months    Status  On-going      PT LONG TERM GOAL #5   Title  Rt shoulder strength at least 4/5 for improved function    Baseline  MET 10/08/2015 Berg Balance 34/56    Time  2    Period  Months    Status  On-going       Baselines were brought in from 2016 PT sessions and unable to delete from pt's current treatment plan and goals.  Kearney Hard, PT 04/10/20 9:23 PM        Plan - 04/05/20 1512    Clinical Impression Statement  Pt still reporting pain with end ranges of PROM in flexion/ABd and ER. Pt still reporting pain of 7/10 with movements. Pt tolerating exercises well but fatigued after receiving her 2nd dose of the COVID vaccine at 9 am this morning. Continue skilled PT and progress toward goals set.    Personal Factors and Comorbidities  Age;Comorbidity 3+    Comorbidities  Lt BKA, OA, asthma, CHF, chronic pain, depression, DM, PVD, RA, tobacco abuse     Examination-Activity Limitations  Bathing;Dressing;Hygiene/Grooming;Toileting;Reach Overhead    Examination-Participation Restrictions  Cleaning    Stability/Clinical Decision Making  Evolving/Moderate complexity    Rehab Potential  Good    PT Frequency  2x / week    PT Duration  6 weeks    PT Treatment/Interventions  ADLs/Self Care Home Management;Cryotherapy;Electrical Stimulation;Ultrasound;Moist Heat;Iontophoresis 53m/ml Dexamethasone;Functional mobility training;Therapeutic activities;Therapeutic exercise;Patient/family education;Neuromuscular re-education;Manual techniques;Taping;Vasopneumatic Device;Dry needling;Passive range of motion    PT Next Visit Plan  Improve thoracolumbar, scapular mobility for GH mobility improvements. work on sHotel manager   PT Home Exercise Plan  Access Code: [email or web address]    [email or web address] Agree with Plan of Care  Patient       Patient will benefit from skilled therapeutic intervention in order to improve the following deficits and impairments:  Pain, Postural dysfunction, Decreased range of motion, Decreased strength, Decreased endurance  Visit Diagnosis: Acute pain of right shoulder  Stiffness of right shoulder, not elsewhere classified  Abnormal posture  Muscle weakness (generalized)     Problem List Patient Active Problem List   Diagnosis Date Noted  . Hallucination, visual 01/17/2020  . Burn 01/17/2020  . Encounter for power mobility device assessment 11/24/2019  . Pain in joint of right shoulder 11/11/2019  . Nontraumatic incomplete tear of right rotator cuff   . CKD (chronic kidney disease) stage 3, GFR 30-59 ml/min 10/18/2019  . 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 Skin rash 06/30/2019  . Impingement syndrome of right shoulder 06/22/2019  . Urinary incontinence 05/13/2018  . Nocturnal hypoxia 06/12/2017  . Morbid obesity with BMI of 40.0-44.9, adult ([place] 03/26/2017  . Toe amputation status, right 01/16/2017  . Thrombocytopenia ([place]  06/05/2016  . 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 Type 2 diabetes with nephropathy ([place] 09/07/2015  . Uncontrolled diabetes mellitus with retinopathy, due to underlying condition, without macular edema ([place] 09/05/2015  . Diabetic retinopathy ([place] 09/05/2015  . Counseling regarding end of life decision making 06/14/2015  . Atherosclerosis of aorta ([place] 04/04/2015  . Anemia 10/05/2014  . Chronic diastolic heart failure ([place]   . Hx of BKA, left ([place]   . Tobacco abuse   . Severe obesity (BMI >= 40) ([place] 03/02/2013  . Abnormality of gait 03/01/2013  . Healthcare maintenance 07/10/2012  . Chronic prescription opiate use 12/03/2011  . Secondary diabetes mellitus with peripheral vascular disease ([place] 08/27/2011  . 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 ([place] 11/29/2009  . Hypertension associated with diabetes ([place] 11/29/2009  . Chronic insomnia 10/25/2009  . GASTROESOPHAGEAL REFLUX DISEASE 11/24/2008  . Depression, major, severe recurrence ([place] 04/06/2008  . DM (diabetes mellitus) type II uncontrolled, periph vascular disorder ([place] 04/02/2007  . Hyperlipidemia associated with type 2 diabetes mellitus ([place]  01/08/2007  . COPD exacerbation ([place]) 01/08/2007    Oretha Caprice, MPT 04/05/2020, 3:19 PM  [facility] Physical Therapy [address] [place], [place], [address] Phone: [phone]   Fax:  [phone]  Name: Jennifer [name] MRN: [id number] Date of Birth: Jul 11, 1961

## 2020-04-06 ENCOUNTER — Other Ambulatory Visit: Payer: Self-pay | Admitting: Internal Medicine

## 2020-04-06 ENCOUNTER — Telehealth: Payer: Medicare Other

## 2020-04-06 ENCOUNTER — Ambulatory Visit: Payer: Self-pay | Admitting: *Deleted

## 2020-04-06 DIAGNOSIS — Z1231 Encounter for screening mammogram for malignant neoplasm of breast: Secondary | ICD-10-CM

## 2020-04-06 DIAGNOSIS — E1121 Type 2 diabetes mellitus with diabetic nephropathy: Secondary | ICD-10-CM

## 2020-04-06 DIAGNOSIS — I5032 Chronic diastolic (congestive) heart failure: Secondary | ICD-10-CM

## 2020-04-06 NOTE — Chronic Care Management (AMB) (Signed)
  Chronic Care Management   Outreach Note  04/06/2020 Name: Jennifer [name] MRN: [id number] DOB: 04/03/1961  Referred by: Bartholomew Crews, MD Reason for referral : Chronic Care Management (IDDM, HTN, COPD)   An unsuccessful telephone outreach was attempted today. The patient was referred to the case management team for assistance with care management and care coordination. HIPAA compliant message left on contact number requesting return call.   Follow Up Plan: The care management team will reach out to the patient again over the next 7 days.   Kelli Churn RN, CCM, [facility] RN Care Manager [phone]

## 2020-04-09 ENCOUNTER — Encounter: Payer: Self-pay | Admitting: Physical Therapy

## 2020-04-09 ENCOUNTER — Telehealth: Payer: Self-pay

## 2020-04-09 NOTE — Telephone Encounter (Signed)
RTC, VM obtained and Hippa compliant message left nurse triage was returning her call and to call back if still needed. SChaplin, RN,BSN

## 2020-04-09 NOTE — Telephone Encounter (Signed)
Requesting to speak with a nurse about A1C. Please call pt back.

## 2020-04-09 NOTE — Telephone Encounter (Signed)
Pt states she would like to get speech therapy, she states her stuttering is getting worse and she is worried. She also ask about how to lower her A1C, went over eating fresh vegetables and fruit, baked or broiled meats. To cut her 2 liter soda a day, drink plenty of water and no junk food snacks. Also ask her to discuss w/ donnap. When donna returns. She was agreeable

## 2020-04-09 NOTE — Telephone Encounter (Signed)
Sent to [facility] Did she change pharmacies again?

## 2020-04-10 NOTE — Telephone Encounter (Signed)
Just spoke with patient.  She agreed to an appt this Thursday 04/12/2020 at 10:15 am with Dr. [name].

## 2020-04-10 NOTE — Telephone Encounter (Signed)
Needs appt to go over MRi results and discuss plans

## 2020-04-11 ENCOUNTER — Other Ambulatory Visit: Payer: Self-pay

## 2020-04-11 ENCOUNTER — Encounter: Payer: Self-pay | Admitting: Physical Therapy

## 2020-04-11 ENCOUNTER — Ambulatory Visit (INDEPENDENT_AMBULATORY_CARE_PROVIDER_SITE_OTHER): Payer: Medicare Other | Admitting: Physical Therapy

## 2020-04-11 DIAGNOSIS — M6281 Muscle weakness (generalized): Secondary | ICD-10-CM

## 2020-04-11 DIAGNOSIS — M25511 Pain in right shoulder: Secondary | ICD-10-CM

## 2020-04-11 DIAGNOSIS — R293 Abnormal posture: Secondary | ICD-10-CM | POA: Diagnosis not present

## 2020-04-11 DIAGNOSIS — M25611 Stiffness of right shoulder, not elsewhere classified: Secondary | ICD-10-CM | POA: Diagnosis not present
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
## 2020-04-12 ENCOUNTER — Ambulatory Visit (INDEPENDENT_AMBULATORY_CARE_PROVIDER_SITE_OTHER): Payer: Medicare Other | Admitting: Physical Therapy

## 2020-04-12 ENCOUNTER — Encounter: Payer: Self-pay | Admitting: Dietician

## 2020-04-12 ENCOUNTER — Ambulatory Visit (INDEPENDENT_AMBULATORY_CARE_PROVIDER_SITE_OTHER): Payer: Medicare Other | Admitting: Internal Medicine

## 2020-04-12 ENCOUNTER — Ambulatory Visit: Payer: Medicare Other | Admitting: Dietician

## 2020-04-12 VITALS — BP 135/53 | HR 64 | Wt 258.0 lb

## 2020-04-12 DIAGNOSIS — R293 Abnormal posture: Secondary | ICD-10-CM

## 2020-04-12 DIAGNOSIS — E1165 Type 2 diabetes mellitus with hyperglycemia: Secondary | ICD-10-CM

## 2020-04-12 DIAGNOSIS — M25611 Stiffness of right shoulder, not elsewhere classified: Secondary | ICD-10-CM

## 2020-04-12 DIAGNOSIS — F8081 Childhood onset fluency disorder: Secondary | ICD-10-CM | POA: Diagnosis not present

## 2020-04-12 DIAGNOSIS — IMO0002 Reserved for concepts with insufficient information to code with codable children: Secondary | ICD-10-CM

## 2020-04-12 DIAGNOSIS — R441 Visual hallucinations: Secondary | ICD-10-CM

## 2020-04-12 DIAGNOSIS — E1151 Type 2 diabetes mellitus with diabetic peripheral angiopathy without gangrene: Secondary | ICD-10-CM | POA: Diagnosis not present

## 2020-04-12 DIAGNOSIS — M25511 Pain in right shoulder: Secondary | ICD-10-CM

## 2020-04-12 DIAGNOSIS — M6281 Muscle weakness (generalized): Secondary | ICD-10-CM

## 2020-04-12 LAB — POCT GLYCOSYLATED HEMOGLOBIN (HGB A1C): Hemoglobin A1C: 8.5 % — AB (ref 4.0–5.6)

## 2020-04-12 LAB — GLUCOSE, CAPILLARY: Glucose-Capillary: 98 mg/dL (ref 70–99)

## 2020-04-12 NOTE — Patient Instructions (Signed)
Keep trying to use the correction 2-3 times a day.   Bring your meter back in May.   It seems the correction is working as long as you take it before meals after checking your blood sugar.   Marland Kitchen
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
## 2020-04-12 NOTE — Therapy (Signed)
[facility] Physical Therapy [address] [place], [place], [address] Phone: [phone]   Fax:  [phone]  Physical Therapy Treatment Discharge Summary  Patient Details  Name: Jennifer [name] MRN: [id number] Date of Birth: 07-14-1961 Referring Provider (PT): Suzan Slick, NP   Encounter Date: 04/12/2020  PT End of Session - 04/12/20 1411    Visit Number  9    Number of Visits  12    Date for PT Re-Evaluation  04/16/20    PT Start Time  7616    PT Stop Time  1440    PT Time Calculation (min)  43 min    Activity Tolerance  Patient tolerated treatment well    Behavior During Therapy  [facility] for tasks assessed/performed       Past Medical History:  Diagnosis Date  . Anginal pain [facility])    '3' of 10 ischemia ruled out 9/9   . Arthritis of lumbar spine   . Asthma   . Cataract   . CHF (congestive heart failure) ([place])   . 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 Chronic bronchitis ([place])    "I get it alot" (09/28/2013)  . Chronic diastolic heart failure (HCC)    grade 2 per 2D echocardiogram (01/2013)  . Chronic lower back pain   . Chronic osteomyelitis of foot (HCC)    chronic, right secondary to diabetic foot ulcers  . Chronic pain syndrome 12/03/2011   Likely secondary to depression, "fibromyalgia", neuropathy, and obesity. Lumbar MRI 2014 no sig change from prior (2008) : Stable hypertrophic facet disease most notable at L4-5. Stable shallow left foraminal/extraforaminal disc protrusion at L4-5. No direct neural compression.      Marland Kitchen COPD 01/08/2007   PFT's 05/2007 : FEV1/FVC 82, FEV1 64% pred, FEF 25-75% 40% predicted, 16% improvement in FEV1 with bronchodilators.     . Depression   . Diabetes mellitus without complication ([place])    Type II  . Diabetic peripheral neuropathy ([place])   . DVT of upper extremity (deep vein thrombosis) ([place]) 03/11/2013   Secondary to PICC line. Right brachial vein, diagnosed on 03/10/2013 Coumadin for 3 months. End  date 06/10/2013   . Dyspnea    "smoker"  . Environmental allergies    Hx: of  . Exertional shortness of breath   . Fatty liver 2003   observed on ultrasound abdomen  . Fibromyalgia   . GERD (gastroesophageal reflux disease)   . Glaucoma   . History of use of hearing aid   . Hyperlipidemia   . Hyperplastic colon polyp 12/2010   Per colonoscopy (12/2010) - Dr. Deatra Ina  . Hypertension   . 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 Blood cultures 01/26/13 S. Aureus and GBS. Blood cultures Feb 6th, 8th, and 9th and March were negative.Repeat TEE 3/20 negative for vegitations  . 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 2/2 tobacco use, DM, and cont weight bearing on surgical wound and developed gangrene   . Pneumonia   . Polymicrobial bacterial infection 01/2013   GBS and S. aureus bacteremia // Source likely infected diabetic foot ulcer  . PVD (peripheral vascular disease) with claudication ([place])    Stents to bilateral common iliac arteries (left 2005, right 2008), on chronic plavix  . Rheumatoid arthritis ([place])   . S/P BKA (below knee amputation) unilateral [facility])    Oct 2014  L - failed limb preserving treatment. 2/2 tobacco use, DM, and cont weight bearing on surgical wound and developed gangrene   . Tobacco abuse   . Type II diabetes mellitus with peripheral circulatory disorders, uncontrolled DX: 1993   Insulin dep. Poor control. Complicated by diabetic foot ulcer and diabetic eye disease.    Marland Kitchen Ulcer of foot, chronic (HCC)    Left. No OM per MRI (01/2013)    Past Surgical History:  Procedure Laterality Date  . ABDOMINAL HYSTERECTOMY  1997   secondary to uterine fibroids  . AMPUTATION Left 08/31/2013   Procedure: AMPUTATION RAY;  Surgeon: Newt Minion, MD;  Location: [place];  Service: Orthopedics;  Laterality: Left;  Left Foot 5th Ray Amputation  . AMPUTATION  Left 09/28/2013   Procedure: Left Midfoot amputation;  Surgeon: Newt Minion, MD;  Location: [place];  Service: Orthopedics;  Laterality: Left;  Left Midfoot amputation  . AMPUTATION Left 10/14/2013   Procedure: AMPUTATION BELOW KNEE- left;  Surgeon: Newt Minion, MD;  Location: [place];  Service: Orthopedics;  Laterality: Left;  Left Below Knee Amputation   . AMPUTATION TOE Right 01/15/2017   Procedure: AMPUTATION 5th TOE RIGHT FOOT;  Surgeon: Edrick Kins, DPM;  Location: [place];  Service: Podiatry;  Laterality: Right;  . APPLICATION OF WOUND VAC  04/01/2019   Procedure: Application Of Wound Vac;  Surgeon: Newt Minion, MD;  Location: [place];  Service: Orthopedics;;  . BLADDER SURGERY     bladder reconstruction surgery  . BREAST BIOPSY     multiple-benign per pt  . COLONOSCOPY    . ESOPHAGOGASTRODUODENOSCOPY N/A 09/20/2013   Procedure: ESOPHAGOGASTRODUODENOSCOPY (EGD);  Surgeon: Jerene Bears, MD;  Location: [place];  Service: Gastroenterology;  Laterality: N/A;  . FOOT AMPUTATION THROUGH METATARSAL Left 09/28/2013  . GANGLION CYST EXCISION     multiple  . PERIPHERAL VASCULAR INTERVENTION     stents in lower ext  . SHOULDER ARTHROSCOPY Right 11/11/2019   RIGHT SHOULDER ARTHROSCOPY AND DEBRIDEMENT   . SHOULDER ARTHROSCOPY Right 11/11/2019   Procedure: RIGHT SHOULDER ARTHROSCOPY AND DEBRIDEMENT;  Surgeon: Newt Minion, MD;  Location: [place];  Service: Orthopedics;  Laterality: Right;  . SHOULDER ARTHROSCOPY W/ ROTATOR CUFF REPAIR Bilateral   . SKIN SPLIT GRAFT Bilateral 05/13/2013   Procedure: Right and Left Foot Allograft Skin Graft;  Surgeon: Newt Minion, MD;  Location: [place];  Service: Orthopedics;  Laterality: Bilateral;  Right and Left Foot Allograft Skin Graft  . STUMP REVISION Left 04/01/2019   Procedure: REVISION LEFT BELOW KNEE AMPUTATION;  Surgeon: Newt Minion, MD;  Location: [place];  Service: Orthopedics;  Laterality: Left;  . TEE WITHOUT CARDIOVERSION N/A 01/31/2013    Procedure: TRANSESOPHAGEAL ECHOCARDIOGRAM (TEE);  Surgeon: Fay Records, MD;  Location: [place];  Service: Cardiovascular;  Laterality: N/A;  Rm [phone]  . TEE WITHOUT CARDIOVERSION N/A 03/10/2013   Procedure: TRANSESOPHAGEAL ECHOCARDIOGRAM (TEE);  Surgeon: Larey Dresser, MD;  Location: [place];  Service: Cardiovascular;  Laterality: N/A;  Rm. [phone]  . TOE AMPUTATION Left 08/31/2013   4TH & 5 TH TOE   . TONSILLECTOMY    . TUBAL LIGATION    . WRIST SURGERY Right    "for tumors" (09/28/2013)    There were no vitals filed for this visit.  Subjective Assessment - 04/12/20 1455    Subjective  Pt still reporting 6/10 pain in R shoulder at times.    Pertinent History  Lt BKA,  OA, asthma, CHF, chronic pain, depression, DM, PVD, RA, tobacco abuse    Limitations  Lifting    Patient Stated Goals  I want to reach the back of head    Currently in Pain?  Yes    Pain Score  6     Pain Location  Shoulder    Pain Orientation  Right    Pain Descriptors / Indicators  Aching    Pain Type  Surgical pain;Chronic pain    Pain Onset  More than a month ago         [facility] PT Assessment - 04/12/20 0001      Assessment   Medical Diagnosis  s/p scope for complete rc tear    Referring Provider (PT)  Suzan Slick, NP    Onset Date/Surgical Date  11/11/19    Hand Dominance  Right    Next MD Visit  PRN    Prior Therapy  SNF then HHPT      Precautions   Precautions  Fall      Posture/Postural Control   Posture/Postural Control  Postural limitations    Postural Limitations  Rounded Shoulders;Forward head      AROM   AROM Assessment Site  Shoulder    Right/Left Shoulder  Right    Right Shoulder Flexion  165 Degrees    Right Shoulder ABduction  160 Degrees      Strength   Strength Assessment Site  Shoulder    Right/Left Shoulder  Right    Right Shoulder Flexion  4/5    Right Shoulder ABduction  4/5    Right Shoulder Internal Rotation  4/5    Right Shoulder External Rotation  4-/5                    [facility] Adult PT Treatment/Exercise - 04/12/20 0001      Exercises   Exercises  --   see pt instructions, all HEP reviwed with 3-5 reps /exercise     Shoulder Exercises: Supine   Horizontal ABduction  Strengthening;Both;20 reps    Theraband Level (Shoulder Horizontal ABduction)  Level 3 ([place])    External Rotation  Both;20 reps;Theraband    Theraband Level (Shoulder External Rotation)  Level 2 ([place])    Flexion  Right;20 reps;Weights    Shoulder Flexion Weight (lbs)  2# wand    Other Supine Exercises  scap retraction 20 x 5 sec      Shoulder Exercises: Seated   Flexion  Strengthening;Both;15 reps    Flexion Limitations  2# wand    Other Seated Exercises  ball squeezes x 15 holding 5 seconds each      Shoulder Exercises: Pulleys   Flexion  2 minutes    Scaption  2 minutes      Shoulder Exercises: Stretch   Internal Rotation Stretch  10 seconds   10 reps                 PT Long Term Goals - 04/12/20 1417      PT LONG TERM GOAL #1   Title  independent with HEP    Time  2    Period  Months    Status  Achieved      PT LONG TERM GOAL #2   Title  improve functional external rotation to back of head for improved function    Baseline  Pt is able to reach the back of her head, but reports some pain intermittently depending on the  day.    Period  Months    Status  Achieved      PT LONG TERM GOAL #3   Title  improve functional internal rotation to at least L2/3 for improved function    Baseline  Thumb to T11, pain noted in anterior joint capsule    Time  2    Period  Months    Status  Achieved      PT LONG TERM GOAL #4   Title  report pain < 4/10 with activity for improved function and decreased pain    Baseline  Pt reporting average pain for ADL's is 7/10.    Time  2    Period  Months    Status  Not Met      PT LONG TERM GOAL #5   Title  Rt shoulder strength at least 4/5 for improved function    Baseline  see flow sheets, ER -4/5.     Time  2    Status  Partially Met            Plan - 04/12/20 1441    Clinical Impression Statement  Pt is being discharged today. Pt has met 3/5 goals, partially met 1/5 goals and is still working toward decreased pain with ADL's. Pt was issued and instructed in advancement of her HEP. Pt in agreement to discharge and understands she can follow up if needs arise in the future.    Personal Factors and Comorbidities  Age;Comorbidity 3+    Comorbidities  Lt BKA, OA, asthma, CHF, chronic pain, depression, DM, PVD, RA, tobacco abuse    Examination-Activity Limitations  Bathing;Dressing;Hygiene/Grooming;Toileting;Reach Overhead    Examination-Participation Restrictions  Cleaning    Stability/Clinical Decision Making  Evolving/Moderate complexity    Rehab Potential  Good    PT Frequency  2x / week    PT Duration  6 weeks    PT Treatment/Interventions  ADLs/Self Care Home Management;Cryotherapy;Electrical Stimulation;Ultrasound;Moist Heat;Iontophoresis 84m/ml Dexamethasone;Functional mobility training;Therapeutic activities;Therapeutic exercise;Patient/family education;Neuromuscular re-education;Manual techniques;Taping;Vasopneumatic Device;Dry needling;Passive range of motion    PT Next Visit Plan  Pt is discharged from skilled PT services. 04/12/2020.    PT Home Exercise Plan  Access Code: [email or web address]      Patient will benefit from skilled therapeutic intervention in order to improve the following deficits and impairments:  Pain, Postural dysfunction, Decreased range of motion, Decreased strength, Decreased endurance  Visit Diagnosis: Acute pain of right shoulder  Stiffness of right shoulder, not elsewhere classified  Abnormal posture  Muscle weakness (generalized)     Problem List Patient Active Problem List   Diagnosis Date Noted  . Stuttering 04/12/2020  . Hallucination, visual 01/17/2020  . CKD (chronic kidney disease) stage 3, GFR 30-59 ml/min 10/18/2019  . Preauricular  cyst 07/13/2019  . Urinary incontinence 05/13/2018  . Nocturnal hypoxia 06/12/2017  . Morbid obesity with BMI of 40.0-44.9, adult ([place] 03/26/2017  . Toe amputation status, right 01/16/2017  . Thrombocytopenia ([place] 06/05/2016  . 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 Type 2 diabetes with nephropathy ([place] 09/07/2015  . Uncontrolled diabetes mellitus with retinopathy, due to underlying condition, without macular edema ([place] 09/05/2015  . Diabetic retinopathy ([place] 09/05/2015  . Counseling regarding end of life decision making 06/14/2015  . Atherosclerosis of aorta ([place] 04/04/2015  . Anemia 10/05/2014  . Chronic diastolic heart failure ([place]   . Hx of BKA, left ([place]   . Tobacco abuse   . Severe obesity (BMI >= 40) ([place] 03/02/2013  . Abnormality of gait 03/01/2013  .  Healthcare maintenance 07/10/2012  . Chronic prescription opiate use 12/03/2011  . Secondary diabetes mellitus with peripheral vascular disease ([place]) 08/27/2011  . 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 ([place]) 11/29/2009  . Hypertension associated with diabetes ([place]) 11/29/2009  . Chronic insomnia 10/25/2009  . GASTROESOPHAGEAL REFLUX DISEASE 11/24/2008  . Depression, major, severe recurrence ([place]) 04/06/2008  . DM (diabetes mellitus) type II uncontrolled, periph vascular disorder ([place]) 04/02/2007  . Hyperlipidemia associated with type 2 diabetes mellitus ([place]) 01/08/2007  . COPD exacerbation ([place]) 01/08/2007   PHYSICAL THERAPY DISCHARGE SUMMARY  Visits from Start of Care: 9  Current functional level related to goals / functional outcomes: See above  Remaining deficits: still progressing toward ER strength of -4/5 and decreased pain    Education / Equipment: HEP Plan: Patient agrees to discharge.  Patient goals were partially met. Patient is being discharged due to being pleased with the current functional level.  ?????      Oretha Caprice 04/12/2020, 3:07 PM  [facility] Physical Therapy [address] [place], [place], [address] Phone: [phone]   Fax:  [phone]  Name: Jennifer [name] MRN: [id number] Date of Birth: [birth year]  Kearney Hard, PT 04/12/20 3:09 PM

## 2020-04-12 NOTE — Assessment & Plan Note (Signed)
This is not a new issue as she has complained of the past.  She states it started sometime after 2021 although she did have stuttering as a child but grew out of it by her teen years.  She does not know what caused it to start this year.  She stated that today she is calm so it is not as bad.  She says getting upset does make it worse but she is not getting upset a lot recently as things are rather common in her life now.  If she is sleepy or tired, it does not make it worse.  We also discussed her visual hallucinations which are documented below.  She also complains of a tremor and has mentioned this in the past.  It is only when she is trying to intentionally move her hand and point, like dialing a telephone number or pressing a button on a phone.  It never occurs at rest.  She is worried because her mother died of Parkinson's disease.  She continues to complain of memory issues.  Recently, she could not write 5 because she could not remember how.  This lasted a day or 2.  It is not occurring as often as it has been.  She is worried because her mom also had dementia.  Assessment: Constellation of stuttering, intermittent visual hallucinations, intermittent memory issues, and intentional tremor.  Stuttering is not a feature of Lewy body dementia or Parkinson's disease.  Her tremor is not consistent with either diagnosis either.  Her stuttering may have a somatic component to it.  Her MRI showed no etiology for any of her symptoms.  Plan: Speech therapy Consider neurology referral if speech therapy feels there is a organic component or does not make improvement Follow memory issues and hallucinations

## 2020-04-12 NOTE — Assessment & Plan Note (Signed)
This problem is chronic and stable.  I got additional information today and she states that this is pretty much gone away.  She has had 2 episodes this year, 1 of which I detailed in her last visit.  She states the hallucinations are gone in 2018 that resolved spontaneously.  Plan: Follow

## 2020-04-12 NOTE — Progress Notes (Addendum)
  Diabetes Self-Management Education  Visit Type:  Follow up  Appt. Start Time: 1100 Appt. End Time: 1110  04/12/2020  [name], identified by name and date of birth, is a 59 y.o. female with a diagnosis of Diabetes:  Type 2  ASSESSMENT/PLAN  Ms Jennifer [name] is here to follow up in her correction and meal time Self monitoring: she did not bring her meter today. She reprots that her blood sugars are trending down from 225-200-198 and 97 fasting today.   198/200/225, twice a day- blood sugars trending down;  98 this am- only ate one time yesterday- took 18-20 units before her meal of frosted flakes    Medications for diabetes: metformin ER 500 mg daily, Ozempic 0.5 on Mondays Tresiba u200 70 units daily, Humalog 18-35 units 2-3 times a day ( but does not check blood sugar often enough to accurately correct her blood sugars)   Plan: consider increasing meal coverage to 15  Units depending on results of meter/CGM.  Encourage her to take it 3 times daily after checking her blood sugars. CGM in May if approved by Dr. [name].  [name], RD 04/12/2020 12:58 PM.

## 2020-04-12 NOTE — Patient Instructions (Signed)
Access Code: [email or web address] URL: [email or web address] Date: 04/12/2020 Prepared by: Kearney Hard  Exercises Supine Shoulder External Internal Rotation AAROM with Dowel - 2 x daily - 7 x weekly - 10 reps - 3-5 sec hold Standing Shoulder Extension AAROM with Dowel - 2 x daily - 7 x weekly - 10 reps - 3-5 sec hold Standing Bilateral Shoulder Internal Rotation AAROM with Dowel - 2 x daily - 7 x weekly - 10 reps - 3-5 sec hold Standing Shoulder Internal Rotation AAROM with Dowel - 2 x daily - 7 x weekly - 10 reps - 3-5 sec hold Seated Flexion Stretch with Swiss Ball - 1 x daily - 7 x weekly - 10 reps Shoulder Flexion Wall Slide with Towel - 1 x daily - 7 x weekly - 10 reps Shoulder Scaption Wall Slide with Towel - 1 x daily - 7 x weekly - 10 reps Seated Scapular Retraction - 1 x daily - 7 x weekly - 10 reps Standing Shoulder Row with Anchored Resistance - 1 x daily - 7 x weekly - 2 sets - 10 reps Standing Shoulder External Rotation with Resistance - 1 x daily - 7 x weekly - 10 reps Standing Shoulder Single Arm PNF D2 Flexion with Resistance - 1 x daily - 7 x weekly - 10 reps

## 2020-04-12 NOTE — Patient Instructions (Signed)
1. I referred you to speech therapy for stuttering. If no better, I will refer you to neurology.  2. Your sugar has gotten better but still high. Thank you for working with [name].  3. [employer] C[employer] for airport. Call them [phone]. Also call airline to arrange aisle chair.

## 2020-04-12 NOTE — Assessment & Plan Note (Signed)
This problem is chronic and uncontrolled but improved.  Her A1c trend has been 9.3 - 8.5 today.  She is on Tresiba 70, semaglutide 0.5 weekly, Metformin XL 500, and Humalog 3 times daily.  She continues to work with [name], our diabetes educator.  PLAN:  Cont current meds

## 2020-04-13 ENCOUNTER — Encounter: Payer: Self-pay | Admitting: Internal Medicine

## 2020-04-13 ENCOUNTER — Telehealth: Payer: Medicare Other

## 2020-04-13 ENCOUNTER — Telehealth: Payer: Self-pay | Admitting: Internal Medicine

## 2020-04-13 NOTE — Telephone Encounter (Signed)
Pt reports she saw Dr. [name] on yesterday 04/12/2020 and her left stump had a little rash on it.  Today the rash spread all over stump and it is peeling and hurting even more today. Patient has made an appt for Monday with [facility] 04/16/2020. Pt wants to know what to do until then.

## 2020-04-16 ENCOUNTER — Ambulatory Visit: Payer: Medicare Other

## 2020-04-16 ENCOUNTER — Ambulatory Visit: Payer: Medicare Other | Admitting: *Deleted

## 2020-04-16 ENCOUNTER — Other Ambulatory Visit: Payer: Self-pay | Admitting: Internal Medicine

## 2020-04-16 ENCOUNTER — Encounter: Payer: Self-pay | Admitting: Internal Medicine

## 2020-04-16 ENCOUNTER — Ambulatory Visit (INDEPENDENT_AMBULATORY_CARE_PROVIDER_SITE_OTHER): Payer: Medicare Other | Admitting: Internal Medicine

## 2020-04-16 VITALS — BP 132/58 | HR 73 | Temp 98.5°F | Ht 67.0 in | Wt 258.2 lb

## 2020-04-16 DIAGNOSIS — Z89512 Acquired absence of left leg below knee: Secondary | ICD-10-CM

## 2020-04-16 DIAGNOSIS — J441 Chronic obstructive pulmonary disease with (acute) exacerbation: Secondary | ICD-10-CM

## 2020-04-16 DIAGNOSIS — IMO0002 Reserved for concepts with insufficient information to code with codable children: Secondary | ICD-10-CM

## 2020-04-16 DIAGNOSIS — I5032 Chronic diastolic (congestive) heart failure: Secondary | ICD-10-CM

## 2020-04-16 DIAGNOSIS — E1151 Type 2 diabetes mellitus with diabetic peripheral angiopathy without gangrene: Secondary | ICD-10-CM

## 2020-04-16 DIAGNOSIS — E1121 Type 2 diabetes mellitus with diabetic nephropathy: Secondary | ICD-10-CM

## 2020-04-16 NOTE — Progress Notes (Signed)
Internal Medicine Clinic Attending  CCM services provided by the care management provider and their documentation were discussed with Dr. Shan Levans. We reviewed the pertinent findings, urgent action items addressed by the resident and non-urgent items to be addressed by the PCP.  I agree with the assessment, diagnosis, and plan of care documented in the CCM and resident's note.  Larey Dresser, MD 04/16/2020

## 2020-04-16 NOTE — Progress Notes (Signed)
CC: peeling and redness of LLE  HPI:  Jennifer [name] is a 59 y.o. F with significant PMH as outlined below, who presents for follow-up of redness on her left stump. Please see problem-based charting for additional information.  Past Medical History:  Diagnosis Date  . Anginal pain [facility])    '3' of 10 ischemia ruled out 9/9   . Arthritis of lumbar spine   . Asthma   . Cataract   . CHF (congestive heart failure) ([place])   . 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 Chronic bronchitis ([place])    "I get it alot" (09/28/2013)  . Chronic diastolic heart failure (HCC)    grade 2 per 2D echocardiogram (01/2013)  . Chronic lower back pain   . Chronic osteomyelitis of foot (HCC)    chronic, right secondary to diabetic foot ulcers  . Chronic pain syndrome 12/03/2011   Likely secondary to depression, "fibromyalgia", neuropathy, and obesity. Lumbar MRI 2014 no sig change from prior (2008) : Stable hypertrophic facet disease most notable at L4-5. Stable shallow left foraminal/extraforaminal disc protrusion at L4-5. No direct neural compression.      Marland Kitchen COPD 01/08/2007   PFT's 05/2007 : FEV1/FVC 82, FEV1 64% pred, FEF 25-75% 40% predicted, 16% improvement in FEV1 with bronchodilators.     . Depression   . Diabetes mellitus without complication ([place])    Type II  . Diabetic peripheral neuropathy ([place])   . DVT of upper extremity (deep vein thrombosis) ([place]) 03/11/2013   Secondary to PICC line. Right brachial vein, diagnosed on 03/10/2013 Coumadin for 3 months. End date 06/10/2013   . Dyspnea    "smoker"  . Environmental allergies    Hx: of  . Exertional shortness of breath   . Fatty liver 2003   observed on ultrasound abdomen  . Fibromyalgia   . GERD (gastroesophageal reflux disease)   . Glaucoma   . History of use of hearing aid   . Hyperlipidemia   . Hyperplastic colon polyp 12/2010   Per colonoscopy (12/2010) - Dr. Deatra Ina  . Hypertension   . 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 Blood cultures 01/26/13 S. Aureus and GBS. Blood cultures Feb 6th, 8th, and 9th and March were negative.Repeat TEE 3/20 negative for vegitations  . 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 2/2 tobacco use, DM, and cont weight bearing on surgical wound and developed gangrene   . Pneumonia   . Polymicrobial bacterial infection 01/2013   GBS and S. aureus bacteremia // Source likely infected diabetic foot ulcer  . PVD (peripheral vascular disease) with claudication ([place])    Stents to bilateral common iliac arteries (left 2005, right 2008), on chronic plavix  . Rheumatoid arthritis ([place])   . S/P BKA (below knee amputation) unilateral [facility])    Oct 2014 L - failed limb preserving treatment. 2/2 tobacco use, DM, and cont weight bearing on surgical wound and developed gangrene   . Tobacco abuse   . Type II diabetes mellitus with peripheral circulatory disorders, uncontrolled DX: 1993   Insulin dep. Poor control. Complicated by diabetic foot ulcer and diabetic eye disease.    Marland Kitchen Ulcer of foot, chronic (HCC)    Left. No OM per MRI (01/2013)   Review of Systems:  Review of Systems  Constitutional: Negative for chills, fever and malaise/fatigue.  Respiratory: Negative for shortness  of breath.   Cardiovascular: Negative for chest pain and palpitations.  Gastrointestinal: Negative for abdominal pain, nausea and vomiting.  Musculoskeletal: Negative for joint pain and myalgias.  Skin:       Redness and peeling over LLE.   Physical Exam:  Vitals:   04/16/20 1319  BP: (!) 132/58  Pulse: 73  Temp: 98.5 F (36.9 C)  TempSrc: Oral  SpO2: 95%  Weight: 258 lb 3.2 oz (117.1 kg)  Height: 5\' 7"  (1.702 m)   Physical Exam Vitals and nursing note reviewed.  Constitutional:      General: She is not in acute distress.    Appearance: She is not ill-appearing.  Musculoskeletal:      Left lower leg: No edema.     Comments: S/p L BKA. Good ROM in L knee without pain.  Skin:    General: Skin is warm and dry.     Comments: Mild erythema with surrounding peeling of epidermis on posterior inferior stump and lateral side above the knee. Not warm or tender to palpation.  Neurological:     Mental Status: She is alert.          Assessment & Plan:   See Encounters Tab for problem based charting.  Patient discussed with Dr. [name]

## 2020-04-16 NOTE — Chronic Care Management (AMB) (Signed)
  Care Management   Follow Up Note   04/16/2020 Name: Jennifer [name] MRN: [id number] DOB: 17-Dec-1961  Referred by: Bartholomew Crews, MD Reason for referral : Care Coordination (Housing, DME)   Jennifer [name] is a 59 y.o. year old female who is a primary care patient of Bartholomew Crews, MD. The care management team was consulted for assistance with care management and care coordination needs.    Review of patient status, including review of consultants reports, relevant laboratory and other test results, and collaboration with appropriate care team members and the patient's provider was performed as part of comprehensive patient evaluation and provision of chronic care management services.    SDOH (Social Determinants of Health) assessments performed: No See Care Plan activities for detailed interventions related to [facility])      Advanced Directives: See Care Plan and Vynca application for related entries.   Goals Addressed            This Visit's Progress   . "I would like to live on my own." (pt-stated)       [place] (see longtitudinal plan of care for additional care plan information)  Current Barriers:  . Housing barriers  Social Work Clinical Goal(s):  Marland Kitchen Over the next 60 days, patient will work with SW to address concerns related to housing  Interventions: . Contacted patient today to follow up on housing resources previously provided.  Patent still undecided about locating another place to live. . Reminded patient that many Senior Living and/or low income options have wait lists; some longer than a year.  Encouraged her to get added to wait list for any property that may be of interest in the future    Patient Self Care Activities:  . Attends all scheduled provider appointments . Performs ADL's independently . Calls provider office for new concerns or questions   Please see past updates related to this goal by clicking on the "Past Updates" button  in the selected goal         Patient upset during call today because she was recently approved for power wheelchair but had not been informed that out of pocket cost would be roughly $700.  Patient unable to afford this cost.   Telephone follow up appointment with care management team member scheduled for:05/14/20     Ronn Melena, [place] Coordination Social Worker [place] [phone]

## 2020-04-16 NOTE — Patient Instructions (Addendum)
Jennifer [name],  It was nice seeing you today! The redness, peeling, and pain on your left leg is due to irritation and rubbing against the prosthetic. To help these areas heal, continue to use moisturizing lotion and Vaseline and wrap the leg with gauze. It will also be important to give the leg breathing time without your prosthetic on. This will relieve the extra pressure on the end of your stump.   Follow-up with Billy on Thursday this week to help troubleshoot ways for protecting your skin while wearing the prosthetic.   Thank you for letting us be a part of your care!

## 2020-04-16 NOTE — Telephone Encounter (Signed)
1 yr supply [place] [employer]

## 2020-04-16 NOTE — Telephone Encounter (Signed)
Coming to [facility] today

## 2020-04-16 NOTE — Patient Instructions (Signed)
Visit Information  Goals Addressed            This Visit's Progress   . "I would like to live on my own." (pt-stated)       Jennifer [name] (see longtitudinal plan of care for additional care plan information)  Current Barriers:  . Housing barriers  Social Work Clinical Goal(s):  Marland Kitchen Over the next 60 days, patient will work with SW to address concerns related to housing  Interventions: . Contacted patient today to follow up on housing resources previously provided.  Patent still undecided about locating another place to live. . Reminded patient that many Senior Living and/or low income options have wait lists; some longer than a year.  Encouraged her to get added to wait list for any property that may be of interest in the future    Patient Self Care Activities:  . Attends all scheduled provider appointments . Performs ADL's independently . Calls provider office for new concerns or questions   Please see past updates related to this goal by clicking on the "Past Updates" button in the selected goal         Patient verbalizes understanding of instructions provided today.   Telephone follow up appointment with care management team member scheduled for:05/14/20     Ronn Melena, [place] Coordination Social Worker [place] [phone]

## 2020-04-16 NOTE — Patient Instructions (Signed)
Visit Information It was nice meeting you in person today. Please use the in network provider lists I provided to make appointments with podiatrist, rheumatologist and ophthalmologist.   Goals Addressed            This Visit's Progress     Patient Stated   . " I've had diabetes since 1991 and I see [name] Plyler on a regular basis; she helps me manage my diabetes" (pt-stated)       [place] (see longitudinal plan of care for additional care plan information).  Current Barriers:  . Chronic Disease Management support, education, and care coordination needs related to CHF, HTN, COPD, and DMII and rheumatoid arthritis- met patient during her clinic appointment. 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 She states there are many days that she only eats one meal a day and even if she eats more than one meal she doesn't check her blood sugar 4 times daily despite being on a sliding scale Rx for mealtime coverage,  Patient requesting in network providers for ophthalmology, podiatry and rheumatology. . Regarding her power wheelchair she says [facility] Medicare told her she would be responsible for over $700 copay and she cannot afford it.   Case Manager Clinical Goal(s):  Marland Kitchen RNCM will work with patient to improve self management strategies for diabetes in order to meet presurgical A1C goal of <8  Interventions:  . Collaborated with BSW to initiate plan of care to address needs related to for  patient with CHF, HTN, COPD, and DMII and rheumatoid arthritis.   . Provided patient with list of in network providers near her address for podiatry, ophthalmology and rheumatology.  Patient Self Care Activities:  . Patient will make appointments with eye MD, rheumatologist, and optometrist.  . Patient will check blood sugar prior to two meals and follow sliding scale Rx to improve blood sugar control and to assist with meeting  criteria for Freestyle Li Please see past updates related to this goal by clicking on the "Past Updates" button in the selected goal        Other   . HEMOGLOBIN A1C < 7.0       Lab Results  Component Value Date   HGBA1C 8.5 (A) 04/12/2020     Reviewed diabetes self management actions reviewed:  Glucose monitoring per provider recommendations- reviewed Freestyle Libre qualifying criteria and collaborated with [name] RD, CDCES to assist patient with obtaining Elenor Legato if she qualifies under the public health emergency guidelines  Take medications as directed  Eat Healthy- reviewed and provided handout entitled "Planning Healthy Meals"   Check feet daily  Visit provider every 3-6 months as directed  Hbg A1C level every 3-6 months.  Eye Exam yearly- provided patient a list of in network providers  Yearly Foot exam- provided patient a list of in network providers    . LDL CALC < 100       Lab Results  Component Value Date   CHOL 139 06/05/2016   HDL 34 (L) 06/05/2016   LDLCALC 77 06/05/2016   TRIG 142 06/05/2016   CHOLHDL 4.1 06/05/2016     Needs update     . Prevent Falls   On track    Patient reports no recent falls.    . Quit smoking / using tobacco   Not on track    Patient states "smoking is my only vice and  I'm not going to give it up"       The patient verbalized understanding of instructions provided today and declined a print copy of patient instruction materials.   The care management team will reach out to the patient again over the next 30 days.   Kelli Churn RN, CCM, [facility] RN Care Manager [phone]

## 2020-04-16 NOTE — Progress Notes (Signed)
Internal Medicine Clinic Resident   I have personally reviewed this encounter including the documentation in this note and/or discussed this patient with the care management provider. I will address any urgent items identified by the care management provider and will communicate my actions to the patient's PCP. I have reviewed the patient's CCM visit with my supervising attending.  Brandon Guyla Bless, MD 04/16/2020    

## 2020-04-16 NOTE — Progress Notes (Signed)
Internal Medicine Clinic PCP & Faculty  I have personally reviewed this encounter including the documentation in this note and/or discussed this patient with the care management provider. I will address any urgent items identified by the care management provider.  Larey Dresser, MD 04/16/2020

## 2020-04-16 NOTE — Assessment & Plan Note (Signed)
Pt presents for pain, redness, and peeling of the skin around her left stump. States she was previously told by her PCP to moisturize and wrap the leg in gauze. However, pt is unable to do that and fit into her prosthetic. Pt keeps her prosthetic on for many hours of the day, often sleeping in it. She has difficulty getting around her home when the prosthetic is off - her wheelchair doesn't fit through doorways, she falls out of her rollator when trying to push it along with her right leg, and she has urinary frequency overnight causing her to need to get up to the bathroom frequently. Endorses pain is currently 8/10 and says her baseline chronic pain is a 5/10. On examination, pt has mild erythema around the posterior inferior aspect of her left stump with surrounding skin peeling. Additional spots of erythema along lateral aspect of thigh above the knee. These areas are not warm or tender to palpation. Normal ROM in the left knee without joint pain. Pt states she has an appointment this Thursday with Mortimer Fries, who fits her prosthetic.  Advised pt that these areas of the skin at irritated due to friction and rubbing against her prosthetic for long periods of time. Recommended she continue using moisturizing lotion and/or Vaseline at home to help these areas heal. Additionally, emphasized the importance of giving the skin time outside of her prosthetic and open to the air whether that be during the day at home or while sleeping in order to relieve pressure on these areas. Pt to follow up with Bobby in two days for additional troubleshooting on skin protection methods while wearing the prosthetic.

## 2020-04-16 NOTE — Chronic Care Management (AMB) (Signed)
Chronic Care Management   Follow Up Note   04/16/2020 Name: Jennifer [name] MRN: [id number] DOB: 1961/03/28  Referred by: Bartholomew Crews, MD Reason for referral : Chronic Care Management (IDDM, HTN, COPD)   Jennifer [name] is a 59 y.o. year old female who is a primary care patient of Bartholomew Crews, MD. The CCM team was consulted for assistance with chronic disease management and care coordination needs.    Review of patient status, including review of consultants reports, relevant laboratory and other test results, and collaboration with appropriate care team members and the patient's provider was performed as part of comprehensive patient evaluation and provision of chronic care management services.    SDOH (Social Determinants of Health) assessments performed: No See Care Plan activities for detailed interventions related to [facility])     Outpatient Encounter Medications as of 04/16/2020  Medication Sig  . albuterol (PROAIR HFA) 108 (90 Base) MCG/ACT inhaler INHALE 2 PUFFS BY MOUTH EVERY 6 HOURS AS NEEDED FOR WHEEZING  . albuterol (PROVENTIL) (2.5 MG/3ML) 0.083% nebulizer solution Take 3 mLs (2.5 mg total) by nebulization every 6 (six) hours as needed for wheezing or shortness of breath. (Patient not taking: Reported on 02/28/2020)  . amLODipine (NORVASC) 10 MG tablet Take 1 tablet (10 mg total) by mouth daily.  Marland Kitchen aspirin EC 81 MG tablet Take 81 mg by mouth daily.  Marland Kitchen atenolol (TENORMIN) 100 MG tablet Take 1 tablet (100 mg total) by mouth daily.  . baclofen (LIORESAL) 10 MG tablet TAKE 1 TABLET BY MOUTH THREE TIMES A DAY AS NEEDED FOR MUSCLE SPASMS  . benazepril (LOTENSIN) 40 MG tablet Take 1 tablet (40 mg total) by mouth daily.  . benzonatate (TESSALON PERLES) 100 MG capsule Take 1 capsule (100 mg total) by mouth at bedtime as needed for cough. (Patient not taking: Reported on 02/28/2020)  . Biotin 5 MG TABS Take 5 mg by mouth daily.  . Blood Glucose Monitoring Suppl ([place]) w/Device KIT Check 4 times a day  . buPROPion (WELLBUTRIN XL) 300 MG 24 hr tablet Take 1 tablet (300 mg total) by mouth daily.  . cholecalciferol (VITAMIN D3) 25 MCG (1000 UT) tablet Take 1 tablet (1,000 Units total) by mouth daily.  . fluticasone (FLONASE) 50 MCG/ACT nasal spray INSTILL 1 SPRAY IN EACH NOSTRIL DAILY (Patient not taking: Reported on 02/28/2020)  . glucose blood (ONETOUCH VERIO) test strip 1 each by Other route as needed for other. Use as instructed. Tests 3-4 times daily.  . hydrochlorothiazide (HYDRODIURIL) 25 MG tablet Take 1 tablet (25 mg total) by mouth daily.  . hydrOXYzine (VISTARIL) 25 MG capsule TAKE 2 CAPSULES BY MOUTH DAILY  . Insulin Degludec (TRESIBA FLEXTOUCH) 200 UNIT/ML SOPN Inject 70 Units into the skin daily.  . insulin lispro (HUMALOG KWIKPEN) 100 UNIT/ML KwikPen IF BLOOD SUGAR IS < 175 DO NOT TAKE ANY CORRECTION INSULIN. 176-225 INJECT 2 UNITS. 226-275 INJECT 4 UNITS. [phone] INJECT 6 UNITS. 326-375 INJECT 8 UNITS. 914-782 INJECT 10 UNITS. 426-475 INJECT 12 UNITS. 956-213 INJECT 14 UNITS AND CALL OFFICE FOR FURTHER INSTRUCTIONS.  . Insulin Pen Needle 32G X 4 MM MISC Use to inject insulin 4 times a day and semaglutide once weekly  . Insulin Syringe-Needle U-100 31G X 15/64" 0.3 ML MISC Use with Humalog to correct blood sugar before meals three times a day  . ipratropium (ATROVENT) 0.02 % nebulizer solution USE 1 VIAL VIA NEBULIZER EVERY 6 HOURS AS NEEDED FOR WHEEZING (Patient not  taking: Reported on 02/28/2020)  . metFORMIN (GLUCOPHAGE-XR) 500 MG 24 hr tablet 1 tablet daily with breakfast  . mirabegron ER (MYRBETRIQ) 50 MG TB24 tablet Take 1 tablet (50 mg total) by mouth daily.  . Multiple Vitamin (MULTIVITAMIN WITH MINERALS) TABS tablet Take 1 tablet by mouth daily.  . Omega-3 Fatty Acids (FISH OIL) 1000 MG CAPS Take 1 capsule (1,000 mg total) by mouth daily.  Marland Kitchen omeprazole (PRILOSEC) 20 MG capsule Take 1 capsule (20 mg total) by mouth 2 (two) times  daily before a meal.  . ondansetron (ZOFRAN) 4 MG tablet Take 1 tablet (4 mg total) by mouth every 6 (six) hours. (Patient not taking: Reported on 02/28/2020)  . oxyCODONE-acetaminophen (PERCOCET/ROXICET) 5-325 MG tablet Take 1 tablet by mouth every 8 (eight) hours as needed. May take a 4th pill if severe pain (up to 10 days monthly) #100 equals 30 day supply  . pregabalin (LYRICA) 200 MG capsule Take 1 capsule (200 mg total) by mouth 3 (three) times daily.  . promethazine (PHENERGAN) 25 MG tablet Take 1 tablet (25 mg total) by mouth every 6 (six) hours as needed for nausea or vomiting. (Patient not taking: Reported on 02/28/2020)  . rosuvastatin (CRESTOR) 20 MG tablet Take 1 tablet (20 mg total) by mouth at bedtime.  . Semaglutide,0.25 or 0.5MG/DOS, (OZEMPIC, 0.25 OR 0.5 MG/DOSE,) 2 MG/1.5ML SOPN Inject 0.5 mg into the skin once a week.  . traZODone (DESYREL) 100 MG tablet Take 1 tablet (100 mg total) by mouth at bedtime as needed for sleep.  Marland Kitchen umeclidinium-vilanterol (ANORO ELLIPTA) 62.5-25 MCG/INH AEPB Inhale 1 puff into the lungs daily.  . vitamin B-12 (CYANOCOBALAMIN) 500 MCG tablet Take 500 mcg by mouth daily.  . vitamin C (ASCORBIC ACID) 500 MG tablet Take 500 mg by mouth daily.  . [DISCONTINUED] Potassium Chloride ER 20 MEQ TBCR Take 40 mEq by mouth daily.   No facility-administered encounter medications on file as of 04/16/2020.     Objective:  Wt Readings from Last 3 Encounters:  04/16/20 258 lb 3.2 oz (117.1 kg)  04/12/20 258 lb (117 kg)  02/16/20 259 lb (117.5 kg)    Goals Addressed            This Visit's Progress     Patient Stated   . " I've had diabetes since 1991 and I see [name] Plyler on a regular basis; she helps me manage my diabetes" (pt-stated)       [place] (see longitudinal plan of care for additional care plan information).  Current Barriers:  . Chronic Disease Management support, education, and care coordination needs related to CHF, HTN, COPD, and  DMII and rheumatoid arthritis- met patient during her clinic appointment. 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 She states there are many days that she only eats one meal a day and even if she eats more than one meal she doesn't check her blood sugar 4 times daily despite being on a sliding scale Rx for mealtime coverage,  Patient requesting in network providers for ophthalmology, podiatry and rheumatology. . Regarding her power wheelchair she says [facility] Medicare told her she would be responsible for over $700 copay and she cannot afford it.   Case Manager Clinical Goal(s):  Marland Kitchen RNCM will work with patient to improve self management strategies for diabetes in order to meet presurgical A1C goal of <8  Interventions:  . Collaborated with  BSW to initiate plan of care to address needs related to for  patient with CHF, HTN, COPD, and DMII and rheumatoid arthritis.   . Provided patient with list of in network providers near her address for podiatry, ophthalmology and rheumatology.  Patient Self Care Activities:  . Patient will make appointments with eye MD, rheumatologist, and optometrist.  . Patient will check blood sugar prior to two meals and follow sliding scale Rx to improve blood sugar control and to assist with meeting criteria for Freestyle Li Please see past updates related to this goal by clicking on the "Past Updates" button in the selected goal        Other   . HEMOGLOBIN A1C < 7.0       Lab Results  Component Value Date   HGBA1C 8.5 (A) 04/12/2020     Reviewed diabetes self management actions reviewed:  Glucose monitoring per provider recommendations- reviewed Freestyle Libre qualifying criteria and collaborated with [name] RD, CDCES to assist patient with obtaining Elenor Legato if she qualifies under the public health emergency guidelines  Take medications as directed  Eat Healthy- reviewed and  provided handout entitled "Planning Healthy Meals"   Check feet daily  Visit provider every 3-6 months as directed  Hbg A1C level every 3-6 months.  Eye Exam yearly- provided patient a list of in network providers  Yearly Foot exam- provided patient a list of in network providers    . LDL CALC < 100       Lab Results  Component Value Date   CHOL 139 06/05/2016   HDL 34 (L) 06/05/2016   LDLCALC 77 06/05/2016   TRIG 142 06/05/2016   CHOLHDL 4.1 06/05/2016     Needs updated lipid profile     . Prevent Falls   On track    Patient reports no recent falls.    . Quit smoking / using tobacco   Not on track    Patient states "smoking is my only vice and I'm not going to give it up"      Plan:   The care management team will reach out to the patient again over the next 30 days.   Kelli Churn RN, CCM, [facility] RN Care Manager [phone]

## 2020-04-17 ENCOUNTER — Encounter: Payer: Medicare Other | Admitting: Physical Therapy

## 2020-04-17 ENCOUNTER — Other Ambulatory Visit: Payer: Self-pay | Admitting: Internal Medicine

## 2020-04-17 DIAGNOSIS — E1151 Type 2 diabetes mellitus with diabetic peripheral angiopathy without gangrene: Secondary | ICD-10-CM

## 2020-04-17 DIAGNOSIS — IMO0002 Reserved for concepts with insufficient information to code with codable children: Secondary | ICD-10-CM

## 2020-04-17 NOTE — Progress Notes (Signed)
Internal Medicine Clinic Attending  Case discussed with Dr. Jones at the time of the visit.  We reviewed the resident's history and exam and pertinent patient test results.  I agree with the assessment, diagnosis, and plan of care documented in the resident's note.  

## 2020-04-19 ENCOUNTER — Encounter: Payer: Medicare Other | Admitting: Physical Therapy

## 2020-04-24 ENCOUNTER — Encounter: Payer: Medicare Other | Admitting: Physical Therapy

## 2020-04-25 ENCOUNTER — Encounter: Payer: Self-pay | Admitting: Internal Medicine

## 2020-04-25 ENCOUNTER — Ambulatory Visit: Payer: Medicare Other | Admitting: Dietician

## 2020-04-25 ENCOUNTER — Other Ambulatory Visit: Payer: Self-pay

## 2020-04-25 ENCOUNTER — Encounter: Payer: Self-pay | Admitting: Dietician

## 2020-04-25 ENCOUNTER — Ambulatory Visit (INDEPENDENT_AMBULATORY_CARE_PROVIDER_SITE_OTHER): Payer: Medicare Other | Admitting: Internal Medicine

## 2020-04-25 VITALS — BP 138/68 | HR 75 | Temp 98.0°F | Ht 67.0 in | Wt 261.4 lb

## 2020-04-25 DIAGNOSIS — R2241 Localized swelling, mass and lump, right lower limb: Secondary | ICD-10-CM | POA: Diagnosis not present

## 2020-04-25 DIAGNOSIS — I5032 Chronic diastolic (congestive) heart failure: Secondary | ICD-10-CM | POA: Diagnosis not present

## 2020-04-25 DIAGNOSIS — E1142 Type 2 diabetes mellitus with diabetic polyneuropathy: Secondary | ICD-10-CM | POA: Insufficient documentation

## 2020-04-25 DIAGNOSIS — Z6841 Body Mass Index (BMI) 40.0 and over, adult: Secondary | ICD-10-CM

## 2020-04-25 NOTE — Progress Notes (Addendum)
CC: Ankle lump  HPI: Jennifer [name] is a 59 y.o. with PMH listed below presenting with complaint of ankle swelling. Please see problem based assessment and plan for further details.  Past Medical History:  Diagnosis Date  . Anginal pain [facility])    '3' of 10 ischemia ruled out 9/9   . Arthritis of lumbar spine   . Asthma   . Cataract   . CHF (congestive heart failure) ([place])   . 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 Chronic bronchitis ([place])    "I get it alot" (09/28/2013)  . Chronic diastolic heart failure (HCC)    grade 2 per 2D echocardiogram (01/2013)  . Chronic lower back pain   . Chronic osteomyelitis of foot (HCC)    chronic, right secondary to diabetic foot ulcers  . Chronic pain syndrome 12/03/2011   Likely secondary to depression, "fibromyalgia", neuropathy, and obesity. Lumbar MRI 2014 no sig change from prior (2008) : Stable hypertrophic facet disease most notable at L4-5. Stable shallow left foraminal/extraforaminal disc protrusion at L4-5. No direct neural compression.      Marland Kitchen COPD 01/08/2007   PFT's 05/2007 : FEV1/FVC 82, FEV1 64% pred, FEF 25-75% 40% predicted, 16% improvement in FEV1 with bronchodilators.     . Depression   . Diabetes mellitus without complication ([place])    Type II  . Diabetic peripheral neuropathy ([place])   . DVT of upper extremity (deep vein thrombosis) ([place]) 03/11/2013   Secondary to PICC line. Right brachial vein, diagnosed on 03/10/2013 Coumadin for 3 months. End date 06/10/2013   . Dyspnea    "smoker"  . Environmental allergies    Hx: of  . Exertional shortness of breath   . Fatty liver 2003   observed on ultrasound abdomen  . Fibromyalgia   . GERD (gastroesophageal reflux disease)   . Glaucoma   . History of use of hearing aid   . Hyperlipidemia   . Hyperplastic colon polyp 12/2010   Per colonoscopy (12/2010) - Dr. Deatra Ina  . Hypertension   . Infective endocarditis 01/2013   TEE 2/14 : Endocarditis involving mitral and  tricuspid valves. Blood cultures 01/26/13 S. Aureus and GBS. Blood cultures Feb 6th, 8th, and 9th and March were negative.Repeat TEE 3/20 negative for vegitations  . 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 2/2 tobacco use, DM, and cont weight bearing on surgical wound and developed gangrene   . Pneumonia   . Polymicrobial bacterial infection 01/2013   GBS and S. aureus bacteremia // Source likely infected diabetic foot ulcer  . PVD (peripheral vascular disease) with claudication ([place])    Stents to bilateral common iliac arteries (left 2005, right 2008), on chronic plavix  . Rheumatoid arthritis ([place])   . S/P BKA (below knee amputation) unilateral [facility])    Oct 2014 L - failed limb preserving treatment. 2/2 tobacco use, DM, and cont weight bearing on surgical wound and developed gangrene   . Tobacco abuse   . Type II diabetes mellitus with peripheral circulatory disorders, uncontrolled DX: 1993   Insulin dep. Poor control. Complicated by diabetic foot ulcer and diabetic eye disease.    Marland Kitchen Ulcer of foot, chronic (HCC)    Left. No OM per MRI (01/2013)   Review of Systems: Review of Systems  Constitutional: Negative for chills, fever and malaise/fatigue.  Eyes: Negative for blurred vision.  Respiratory: Negative for shortness of breath.  Cardiovascular: Positive for leg swelling. Negative for chest pain and palpitations.  Gastrointestinal: Negative for constipation, diarrhea, nausea and vomiting.  Genitourinary: Positive for frequency.  Neurological: Negative for dizziness, sensory change and headaches.    Physical Exam: Vitals:   04/25/20 1403  BP: 138/68  Pulse: 75  Temp: 98 F (36.7 C)  TempSrc: Oral  SpO2: 97%  Weight: 261 lb 6.4 oz (118.6 kg)  Height: 5\' 7"  (1.702 m)   Physical Exam  Constitutional: She is oriented to person, place, and time. She appears well-developed and well-nourished. No distress.    Cardiovascular: Normal rate, regular rhythm, normal heart sounds and intact distal pulses.  No murmur heard. Respiratory: Effort normal and breath sounds normal. She has no wheezes. She has no rales.  GI: Soft. Bowel sounds are normal. She exhibits no distension. There is no abdominal tenderness.  Musculoskeletal:        General: Deformity (L side s/p BKA) and edema (2+ pitting edema up to R knee) present. Normal range of motion.  Neurological: She is alert and oriented to person, place, and time.  Skin: Skin is warm and dry. No rash noted. No erythema.  Non-tender, non-erythematous fluctuant area pictured below on R medial ankle     Assessment & Plan:   Diabetic polyneuropathy associated with type 2 diabetes mellitus [facility]) Jennifer [name] states she had no sensation of her right foot. On exam, unable to distinguish fine sensation. Also observed to have various callouses that she 'files' on her own. Discussed risks of unintentional injury to the foot with her neuropathy and expressed importance of follow up with podiatry. She mentions that she has not seen her podiatrist for couple of years.  - Referral to podiatry placed  Ankle mass, right Jennifer [name] is a 59 yo F w/ PMH of T2DM, HFpEF, Morbid obesity, PVD s/p LLE BKA, Depression and GERD presenting to [facility] w/ complaints of R ankle mass. She states she was in her usual state of health until noticing bulging area on her R medial ankle earlier today. She denies any inciting event or trauma to the region. Ankle range of motion is intact. She mentions no prior hx of similar symptoms. She denies any tenderness of the area. On further questioning, she mentions that she noticed gradual swelling of her right lower extremity after stopping her HCTZ. She mentions having difficulty with going to the bathroom when taking her diuretics. She has a bedside commode but it is inconvenient for her and she uses Depends usually for her urination.  A/P: Jennifer [name]  presents w/ concern regarding right ankle swelling in setting of medication non-adherence. Area of her ankle described as mass appears to be fluid collection driven by her diastolic heart failure. Currently lungs are clear and not in respiratory distress. She mentions having both furosemide and HCTZ at home. Advised to start furosemide 40mg  daily and return in a week to assess diuresis.  - Start furosemide 40mg  daily - Stop HCTZ 25mg   - BMP today - Return to clinic in 1 week  Chronic diastolic heart failure ([place]) Ms.Bowens presents w/ lower extremity swelling. Current weight 4 pound over dry weight. Previously on furosemide but d/ced once reaching dry weight. Not in respiratory distress but has not been taking her HCTZ.  - Advised to start furosemide and stop HCTZ - Emphasized importance of avoiding salt intake   Patient discussed with Dr. Dareen Piano   -Gilberto Better, PGY2 [facility] Pager: [phone]

## 2020-04-25 NOTE — Assessment & Plan Note (Addendum)
Ms.Barba is a 59 yo F w/ PMH of T2DM, HFpEF, Morbid obesity, PVD s/p LLE BKA, Depression and GERD presenting to [facility] w/ complaints of R ankle mass. She states she was in her usual state of health until noticing bulging area on her R medial ankle earlier today. She denies any inciting event or trauma to the region. Ankle range of motion is intact. She mentions no prior hx of similar symptoms. She denies any tenderness of the area. On further questioning, she mentions that she noticed gradual swelling of her right lower extremity after stopping her HCTZ. She mentions having difficulty with going to the bathroom when taking her diuretics. She has a bedside commode but it is inconvenient for her and she uses Depends usually for her urination.  A/P: Ms.Pfefferkorn presents w/ concern regarding right ankle swelling in setting of medication non-adherence. Area of her ankle described as mass appears to be fluid collection driven by her diastolic heart failure. Currently lungs are clear and not in respiratory distress. She mentions having both furosemide and HCTZ at home. Advised to start furosemide 40mg  daily and return in a week to assess diuresis.  - Start furosemide 40mg  daily - Stop HCTZ 25mg   - BMP today - Return to clinic in 1 week

## 2020-04-25 NOTE — Progress Notes (Signed)
  Diabetes Self-Management Education  Visit Type:  Follow up  Appt. Start Time: 1328 Appt. End Time: [id number]  04/12/2020  Jennifer [name], identified by name and date of birth, is a 59 y.o. female with a diagnosis of Diabetes:  Type 2  ASSESSMENT/PLAN  [name] is here to follow up in her correction and meal time.   Self monitoring: she did bring her meter today. She states that she has not checked as frequently as she would have liked. She reports that one problem is that she has a difficult time getting blood from fingers. Skin is tough and she uses a "6" setting on her lancing device. She mentions that she often gets errors, which happened today in the office.   METER DOWNLOAD  Report summary is from last 30 days,  Average tests per day: 21/30 Average blood glucose: 229 Range: minimum: 116 and maximum: 419 Days without test: 9 % in target range: 19 % below target range: 0 % above target range: 81 % hypoglycemia: 0 Notes about patterns: highest blood glucsoe occurs after breafkast and lunch and 2 overnight   Medications for diabetes: not discussed today as her visit was cut short and her focus was self monitoring.   Plan: explained about Public Health emergency guidelines for exceptions to testing frequency for approval of CGM and assisted her in applying to supplier for call about coverage. Consider Professional vs personal CGM at next visit in 1 week  [name], [place] 04/25/2020 4:04 PM.

## 2020-04-25 NOTE — Assessment & Plan Note (Addendum)
Jennifer [name] presents w/ lower extremity swelling. Current weight 4 pound over dry weight. Previously on furosemide but d/ced once reaching dry weight. Not in respiratory distress but has not been taking her HCTZ.  - Advised to start furosemide and stop HCTZ - Emphasized importance of avoiding salt intake

## 2020-04-25 NOTE — Assessment & Plan Note (Signed)
Jennifer [name] states she had no sensation of her right foot. On exam, unable to distinguish fine sensation. Also observed to have various callouses that she 'files' on her own. Discussed risks of unintentional injury to the foot with her neuropathy and expressed importance of follow up with podiatry. She mentions that she has not seen her podiatrist for couple of years.  - Referral to podiatry placed

## 2020-04-25 NOTE — Patient Instructions (Addendum)
Thank you for allowing [place] to provide your care today. Today we discussed your ankle swelling    I have ordered bmp labs for you. I will call if any are abnormal.    Today we made the following changes to your medications.    Please start furosemide 40mg  daily Please stop hctz  Please follow-up in 1 week.    Should you have any questions or concerns please call the internal medicine clinic at [phone].     Edema  Edema is when you have too much fluid in your body or under your skin. Edema may make your legs, feet, and ankles swell up. Swelling is also common in looser tissues, like around your eyes. This is a common condition. It gets more common as you get older. There are many possible causes of edema. Eating too much salt (sodium) and being on your feet or sitting for a long time can cause edema in your legs, feet, and ankles. Hot weather may make edema worse. Edema is usually painless. Your skin may look swollen or shiny. Follow these instructions at home:  Keep the swollen body part raised (elevated) above the level of your heart when you are sitting or lying down.  Do not sit still or stand for a long time.  Do not wear tight clothes. Do not wear garters on your upper legs.  Exercise your legs. This can help the swelling go down.  Wear elastic bandages or support stockings as told by your doctor.  Eat a low-salt (low-sodium) diet to reduce fluid as told by your doctor.  Depending on the cause of your swelling, you may need to limit how much fluid you drink (fluid restriction).  Take over-the-counter and prescription medicines only as told by your doctor. Contact a doctor if:  Treatment is not working.  You have heart, liver, or kidney disease and have symptoms of edema.  You have sudden and unexplained weight gain. Get help right away if:  You have shortness of breath or chest pain.  You cannot breathe when you lie down.  You have pain, redness, or warmth in  the swollen areas.  You have heart, liver, or kidney disease and get edema all of a sudden.  You have a fever and your symptoms get worse all of a sudden. Summary  Edema is when you have too much fluid in your body or under your skin.  Edema may make your legs, feet, and ankles swell up. Swelling is also common in looser tissues, like around your eyes.  Raise (elevate) the swollen body part above the level of your heart when you are sitting or lying down.  Follow your doctor's instructions about diet and how much fluid you can drink (fluid restriction). This information is not intended to replace advice given to you by your health care provider. Make sure you discuss any questions you have with your health care provider. Document Revised: 12/11/2017 Document Reviewed: 12/26/2016 Elsevier Patient Education  2020 [employer].

## 2020-04-26 ENCOUNTER — Encounter: Payer: Medicare Other | Admitting: Physical Therapy

## 2020-04-26 LAB — BMP8+ANION GAP
Anion Gap: 14 mmol/L (ref 10.0–18.0)
BUN/Creatinine Ratio: 18 (ref 12–28)
BUN: 22 mg/dL (ref 8–27)
CO2: 23 mmol/L (ref 20–29)
Calcium: 9.2 mg/dL (ref 8.7–10.3)
Chloride: 106 mmol/L (ref 96–106)
Creatinine, Ser: 1.2 mg/dL — ABNORMAL HIGH (ref 0.57–1.00)
GFR calc Af Amer: 56 mL/min/{1.73_m2} — ABNORMAL LOW (ref >59)
GFR calc non Af Amer: 49 mL/min/{1.73_m2} — ABNORMAL LOW (ref >59)
Glucose: 279 mg/dL — ABNORMAL HIGH (ref 65–99)
Potassium: 4.7 mmol/L (ref 3.5–5.2)
Sodium: 143 mmol/L (ref 134–144)

## 2020-04-26 NOTE — Progress Notes (Signed)
Internal Medicine Clinic Attending ° °Case discussed with Dr. Lee at the time of the visit.  We reviewed the resident’s history and exam and pertinent patient test results.  I agree with the assessment, diagnosis, and plan of care documented in the resident’s note.  °

## 2020-04-30 ENCOUNTER — Ambulatory Visit
Admission: RE | Admit: 2020-04-30 | Discharge: 2020-04-30 | Disposition: A | Discharge status: Home / Self Care | Payer: Medicare Other | Source: Ambulatory Visit | Attending: Internal Medicine | Admitting: Internal Medicine

## 2020-04-30 ENCOUNTER — Other Ambulatory Visit: Payer: Self-pay

## 2020-04-30 ENCOUNTER — Other Ambulatory Visit: Payer: Self-pay | Admitting: Internal Medicine

## 2020-04-30 DIAGNOSIS — Z1231 Encounter for screening mammogram for malignant neoplasm of breast: Secondary | ICD-10-CM

## 2020-04-30 MED ORDER — OXYCODONE-ACETAMINOPHEN 5-325 MG PO TABS: ORAL_TABLET | ORAL | 0 refills | Status: DC

## 2020-04-30 NOTE — Progress Notes (Signed)
[place] database OK Pls let her know I refilled oxycodone [facility] Due about the 17th Thanks

## 2020-05-01 ENCOUNTER — Encounter: Payer: Medicare Other | Admitting: Physical Therapy

## 2020-05-02 ENCOUNTER — Telehealth: Payer: Self-pay

## 2020-05-02 ENCOUNTER — Other Ambulatory Visit: Payer: Self-pay | Admitting: *Deleted

## 2020-05-02 NOTE — Telephone Encounter (Signed)
She has hydroxyzine for itching, so yes appt if worse

## 2020-05-02 NOTE — Telephone Encounter (Signed)
Pt states she is itching all over the body, requesting med. Please call pt back.

## 2020-05-02 NOTE — Telephone Encounter (Signed)
Appointment

## 2020-05-02 NOTE — Telephone Encounter (Signed)
Called pt, she states its been awhile since the pharmacy sent any of the hydroxyzine, called pharm, the rep stated she must tell them when she needs it, they will send it out and call her now.

## 2020-05-03 ENCOUNTER — Encounter: Payer: Medicare Other | Admitting: Physical Therapy

## 2020-05-07 ENCOUNTER — Other Ambulatory Visit: Payer: Self-pay | Admitting: Internal Medicine

## 2020-05-09 ENCOUNTER — Ambulatory Visit (INDEPENDENT_AMBULATORY_CARE_PROVIDER_SITE_OTHER): Payer: Medicare Other | Admitting: Podiatry

## 2020-05-09 ENCOUNTER — Other Ambulatory Visit: Payer: Self-pay

## 2020-05-09 ENCOUNTER — Ambulatory Visit (INDEPENDENT_AMBULATORY_CARE_PROVIDER_SITE_OTHER): Payer: Medicare Other

## 2020-05-09 DIAGNOSIS — M79671 Pain in right foot: Secondary | ICD-10-CM

## 2020-05-09 DIAGNOSIS — E0843 Diabetes mellitus due to underlying condition with diabetic autonomic (poly)neuropathy: Secondary | ICD-10-CM

## 2020-05-09 DIAGNOSIS — L989 Disorder of the skin and subcutaneous tissue, unspecified: Secondary | ICD-10-CM | POA: Diagnosis not present

## 2020-05-09 DIAGNOSIS — B351 Tinea unguium: Secondary | ICD-10-CM | POA: Diagnosis not present

## 2020-05-09 DIAGNOSIS — Z89512 Acquired absence of left leg below knee: Secondary | ICD-10-CM

## 2020-05-09 DIAGNOSIS — M79674 Pain in right toe(s): Secondary | ICD-10-CM | POA: Diagnosis not present

## 2020-05-10 ENCOUNTER — Ambulatory Visit: Payer: Medicare Other | Admitting: *Deleted

## 2020-05-10 DIAGNOSIS — E1159 Type 2 diabetes mellitus with other circulatory complications: Secondary | ICD-10-CM

## 2020-05-10 DIAGNOSIS — E1151 Type 2 diabetes mellitus with diabetic peripheral angiopathy without gangrene: Secondary | ICD-10-CM

## 2020-05-10 DIAGNOSIS — IMO0002 Reserved for concepts with insufficient information to code with codable children: Secondary | ICD-10-CM

## 2020-05-10 NOTE — Progress Notes (Signed)
Internal Medicine Clinic Attending  CCM services provided by the care management provider and their documentation were discussed with Dr. Aslam. We reviewed the pertinent findings, urgent action items addressed by the resident and non-urgent items to be addressed by the PCP.  I agree with the assessment, diagnosis, and plan of care documented in the CCM and resident's note.  Jennifer [name] Adaia Matthies, MD 05/10/2020  

## 2020-05-10 NOTE — Progress Notes (Addendum)
Internal Medicine Clinic Resident  I have personally reviewed this encounter including the documentation in this note and/or discussed this patient with the care management provider. I will address any urgent items identified by the care management provider and will communicate my actions to the patient's PCP. I have reviewed the patient's CCM visit with my supervising attending, Dr Evette Doffing. Patient has appointment in [facility] tomorrow morning for evaluation of her right fifth digit.  Harvie Heck, MD  Internal Medicine, PGY-1 05/10/2020
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
## 2020-05-11 ENCOUNTER — Ambulatory Visit (INDEPENDENT_AMBULATORY_CARE_PROVIDER_SITE_OTHER): Payer: Medicare Other | Admitting: Internal Medicine

## 2020-05-11 ENCOUNTER — Other Ambulatory Visit: Payer: Self-pay

## 2020-05-11 VITALS — BP 146/56 | HR 68 | Temp 98.1°F | Ht 67.0 in | Wt 258.1 lb

## 2020-05-11 DIAGNOSIS — L03011 Cellulitis of right finger: Secondary | ICD-10-CM | POA: Diagnosis not present

## 2020-05-11 DIAGNOSIS — Z89512 Acquired absence of left leg below knee: Secondary | ICD-10-CM | POA: Diagnosis not present

## 2020-05-11 MED ORDER — SULFAMETHOXAZOLE-TRIMETHOPRIM 400-80 MG PO TABS: 1.0000 | ORAL_TABLET | Freq: Two times a day (BID) | ORAL | 0 refills | Status: AC

## 2020-05-11 MED ORDER — ONDANSETRON HCL 4 MG PO TABS: 4.0000 mg | ORAL_TABLET | Freq: Four times a day (QID) | ORAL | 0 refills | Status: DC
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
## 2020-05-11 NOTE — Assessment & Plan Note (Signed)
Jennifer [name] continues to endorse redness and peeling of the skin around her left stump. She has been using aloe vera lotion to keep her skin moist and mentions not going to bed with the prosthesis on as instructed. She mentions her primary concern is the pruritus associated with the skin peeling. Denies any warmth, tenderness.  A/P On exam, skin peeling with erythema of the L BKA site without tenderness or edema. Likely due to pressure from prosthesis use. Advised to f/u with her prosthetist, Mortimer Fries  - F/u with Prosthetist

## 2020-05-11 NOTE — Patient Instructions (Signed)
Thank you for allowing [place] to provide your care today. Today we discussed your finger infection    Today we made the following changes to your medications.    Please start trimethoprim-sulbactam 400mg  twice daily for 7 days  Please follow-up in as needed.    Should you have any questions or concerns please call the internal medicine clinic at [phone].     Cellulitis, Adult  Cellulitis is a skin infection. The infected area is often warm, red, swollen, and sore. It occurs most often in the arms and lower legs. It is very important to get treated for this condition. What are the causes? This condition is caused by bacteria. The bacteria enter through a break in the skin, such as a cut, burn, insect bite, open sore, or crack. What increases the risk? This condition is more likely to occur in people who:  Have a weak body defense system (immune system).  Have open cuts, burns, bites, or scrapes on the skin.  Are older than 61 years of age.  Have a blood sugar problem (diabetes).  Have a long-lasting (chronic) liver disease (cirrhosis) or kidney disease.  Are very overweight (obese).  Have a skin problem, such as: ? Itchy rash (eczema). ? Slow movement of blood in the veins (venous stasis). ? Fluid buildup below the skin (edema).  Have been treated with high-energy rays (radiation).  Use IV drugs. What are the signs or symptoms? Symptoms of this condition include:  Skin that is: ? Red. ? Streaking. ? Spotting. ? Swollen. ? Sore or painful when you touch it. ? Warm.  A fever.  Chills.  Blisters. How is this diagnosed? This condition is diagnosed based on:  Medical history.  Physical exam.  Blood tests.  Imaging tests. How is this treated? Treatment for this condition may include:  Medicines to treat infections or allergies.  Home care, such as: ? Rest. ? Placing cold or warm cloths (compresses) on the skin.  Hospital care, if the condition is  very bad. Follow these instructions at home: Medicines  Take over-the-counter and prescription medicines only as told by your doctor.  If you were prescribed an antibiotic medicine, take it as told by your doctor. Do not stop taking it even if you start to feel better. General instructions   Drink enough fluid to keep your pee (urine) pale yellow.  Do not touch or rub the infected area.  Raise (elevate) the infected area above the level of your heart while you are sitting or lying down.  Place cold or warm cloths on the area as told by your doctor.  Keep all follow-up visits as told by your doctor. This is important. Contact a doctor if:  You have a fever.  You do not start to get better after 1-2 days of treatment.  Your bone or joint under the infected area starts to hurt after the skin has healed.  Your infection comes back. This can happen in the same area or another area.  You have a swollen bump in the area.  You have new symptoms.  You feel ill and have muscle aches and pains. Get help right away if:  Your symptoms get worse.  You feel very sleepy.  You throw up (vomit) or have watery poop (diarrhea) for a long time.  You see red streaks coming from the area.  Your red area gets larger.  Your red area turns dark in color. These symptoms may represent a serious problem that is  an emergency. Do not wait to see if the symptoms will go away. Get medical help right away. Call your local emergency services (911 in the [place] Do not drive yourself to the hospital. Summary  Cellulitis is a skin infection. The area is often warm, red, swollen, and sore.  This condition is treated with medicines, rest, and cold and warm cloths.  Take all medicines only as told by your doctor.  Tell your doctor if symptoms do not start to get better after 1-2 days of treatment. This information is not intended to replace advice given to you by your health care provider. Make sure  you discuss any questions you have with your health care provider. Document Revised: 04/29/2018 Document Reviewed: 04/29/2018 Elsevier Patient Education  [place].

## 2020-05-13 NOTE — Progress Notes (Signed)
Subjective: Patient is a 59 y.o. female with PMHx of diabetes mellitus and LLE BKA presenting to the office today with a chief complaint of painful callus lesion(s) noted to the right foot that has been present for the past year. Walking and applying pressure to the foot increases the pain. She has not had any treatment for the symptoms.  Patient also complains of elongated, thickened nails that cause pain while ambulating in shoes. She is unable to trim her own nails. She also reports a feeling of being "shocked" in the right foot secondary to neuropathy that has been present for over a year. Walking increases the pain. She has been taking Oxycodone and Lyrica for treatment. Patient presents today for further treatment and evaluation.  Past Medical History:  Diagnosis Date  . Anginal pain [facility])    '3' of 10 ischemia ruled out 9/9   . Arthritis of lumbar spine   . Asthma   . Cataract   . CHF (congestive heart failure) ([place])   . 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 Chronic bronchitis ([place])    "I get it alot" (09/28/2013)  . Chronic diastolic heart failure (HCC)    grade 2 per 2D echocardiogram (01/2013)  . Chronic lower back pain   . Chronic osteomyelitis of foot (HCC)    chronic, right secondary to diabetic foot ulcers  . Chronic pain syndrome 12/03/2011   Likely secondary to depression, "fibromyalgia", neuropathy, and obesity. Lumbar MRI 2014 no sig change from prior (2008) : Stable hypertrophic facet disease most notable at L4-5. Stable shallow left foraminal/extraforaminal disc protrusion at L4-5. No direct neural compression.      Marland Kitchen COPD 01/08/2007   PFT's 05/2007 : FEV1/FVC 82, FEV1 64% pred, FEF 25-75% 40% predicted, 16% improvement in FEV1 with bronchodilators.     . Depression   . Diabetes mellitus without complication ([place])    Type II  . Diabetic peripheral neuropathy ([place])   . DVT of upper extremity (deep vein thrombosis) ([place]) 03/11/2013   Secondary to PICC  line. Right brachial vein, diagnosed on 03/10/2013 Coumadin for 3 months. End date 06/10/2013   . Dyspnea    "smoker"  . Environmental allergies    Hx: of  . Exertional shortness of breath   . Fatty liver 2003   observed on ultrasound abdomen  . Fibromyalgia   . GERD (gastroesophageal reflux disease)   . Glaucoma   . History of use of hearing aid   . Hyperlipidemia   . Hyperplastic colon polyp 12/2010   Per colonoscopy (12/2010) - Dr. Deatra Ina  . Hypertension   . 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 Blood cultures 01/26/13 S. Aureus and GBS. Blood cultures Feb 6th, 8th, and 9th and March were negative.Repeat TEE 3/20 negative for vegitations  . 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 2/2 tobacco use, DM, and cont weight bearing on surgical wound and developed gangrene   . Pneumonia   . Polymicrobial bacterial infection 01/2013   GBS and S. aureus bacteremia // Source likely infected diabetic foot ulcer  . PVD (peripheral vascular disease) with claudication ([place])    Stents to bilateral common iliac arteries (left 2005, right 2008), on chronic plavix  . Rheumatoid arthritis ([place])   . S/P BKA (below knee amputation) unilateral [facility])    Oct 2014 L - failed limb preserving  treatment. 2/2 tobacco use, DM, and cont weight bearing on surgical wound and developed gangrene   . Tobacco abuse   . Type II diabetes mellitus with peripheral circulatory disorders, uncontrolled DX: 1993   Insulin dep. Poor control. Complicated by diabetic foot ulcer and diabetic eye disease.    Marland Kitchen Ulcer of foot, chronic (HCC)    Left. No OM per MRI (01/2013)    Objective:  Physical Exam General: Alert and oriented x3 in no acute distress  Dermatology: Hyperkeratotic lesion(s) present on the right foot. Pain on palpation with a central nucleated core noted. Skin is warm, dry and supple  right lower extremities. Negative for open lesions or macerations. Nails are tender, long, thickened and dystrophic with subungual debris, consistent with onychomycosis, 1-5 right. No signs of infection noted.  Vascular: Palpable pedal pulses RLE. No edema or erythema noted. Capillary refill within normal limits.  Neurological: Epicritic and protective threshold diminished RLE.   Musculoskeletal Exam: Pain on palpation at the keratotic lesion(s) noted. Range of motion within normal limits RLE. Muscle strength 5/5 in all groups RLE.  Assessment: 1. Onychodystrophic nails 1-5 RLE with hyperkeratosis of nails.  2. Onychomycosis of nail due to dermatophyte RLE 3. Pre-ulcerative callus lesions noted to the right foot x 2   Plan of Care:  1. Patient evaluated. 2. Excisional debridement of keratoic lesion(s) using a chisel blade was performed without incident.  3. Dressed with light dressing. 4. Mechanical debridement of nails 1-5 right foot performed using a nail nipper. Filed with dremel without incident.  5. Appointment with Pedorthist for DM shoes.  6. Patient is to return to the clinic in 3 months.   Edrick Kins, DPM [facility]  Dr. Edrick Kins, [address]                                        [place], [place] [address]                Office [phone]  Fax [phone]

## 2020-05-14 ENCOUNTER — Ambulatory Visit: Payer: Medicare Other

## 2020-05-14 DIAGNOSIS — E1151 Type 2 diabetes mellitus with diabetic peripheral angiopathy without gangrene: Secondary | ICD-10-CM

## 2020-05-14 DIAGNOSIS — E1159 Type 2 diabetes mellitus with other circulatory complications: Secondary | ICD-10-CM

## 2020-05-14 DIAGNOSIS — I152 Hypertension secondary to endocrine disorders: Secondary | ICD-10-CM

## 2020-05-14 DIAGNOSIS — IMO0002 Reserved for concepts with insufficient information to code with codable children: Secondary | ICD-10-CM

## 2020-05-14 NOTE — Progress Notes (Signed)
Internal Medicine Clinic Attending  I saw and evaluated the patient.  I personally confirmed the key portions of the history and exam documented by Dr. Lee and I reviewed pertinent patient test results.  The assessment, diagnosis, and plan were formulated together and I agree with the documentation in the resident's note.  
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
## 2020-05-15 ENCOUNTER — Telehealth: Payer: Self-pay | Admitting: Internal Medicine

## 2020-05-15 NOTE — Telephone Encounter (Signed)
Pls contact pt regarding medicine [phone]

## 2020-05-15 NOTE — Telephone Encounter (Signed)
Thank you. DO I need to refill anything?

## 2020-05-15 NOTE — Telephone Encounter (Signed)
Called pt - stated she's going to [place] for 2 months; she will need Lyrica and all 3 insulins refilled. Stated she called [facility]; she was told Lyrica cannot be filled until 5/6.she stated she used to get all her meds at the same time.   I called [facility] - stated Lyrica cannot be filled until 5/6; and may be transferred x 1 to another pharmacy since it's a control substance then will need new rx. And her insulin rxs (refills) may be transferred also.  Called pt - informed of the above. Also informed [facility] Pt stated [facility]'s [facility] of [facility]. Also informed she needs to [facility] in [place] - stated she will call her cousin.

## 2020-05-15 NOTE — Telephone Encounter (Signed)
Not at this time.

## 2020-05-16 ENCOUNTER — Other Ambulatory Visit: Payer: Self-pay | Admitting: Internal Medicine

## 2020-05-16 DIAGNOSIS — E1151 Type 2 diabetes mellitus with diabetic peripheral angiopathy without gangrene: Secondary | ICD-10-CM

## 2020-05-16 DIAGNOSIS — E1121 Type 2 diabetes mellitus with diabetic nephropathy: Secondary | ICD-10-CM

## 2020-05-16 DIAGNOSIS — IMO0002 Reserved for concepts with insufficient information to code with codable children: Secondary | ICD-10-CM

## 2020-05-16 NOTE — Telephone Encounter (Signed)
Patient called to [facility] the correct [facility] to [facility] while she is in [place].   [facility] Address: [address], [place] [address] Phone: [phone]

## 2020-05-18 ENCOUNTER — Ambulatory Visit: Payer: Medicare Other

## 2020-05-25 ENCOUNTER — Telehealth: Payer: Self-pay | Admitting: Dietician

## 2020-05-25 MED ORDER — INSULIN LISPRO (1 UNIT DIAL) 100 UNIT/ML (KWIKPEN): PEN_INJECTOR | SUBCUTANEOUS | 0 refills | Status: DC

## 2020-05-25 MED ORDER — PREGABALIN 200 MG PO CAPS: ORAL_CAPSULE | ORAL | 0 refills | Status: DC

## 2020-05-25 MED ORDER — TRESIBA FLEXTOUCH 200 UNIT/ML ~~LOC~~ SOPN: 70.0000 [IU] | PEN_INJECTOR | Freq: Every day | SUBCUTANEOUS | 0 refills | Status: DC

## 2020-05-25 NOTE — Telephone Encounter (Signed)
Call from patient requesting [facility] [place].Jennifer [name], [name]6/4/202110:44 AM

## 2020-05-25 NOTE — Addendum Note (Signed)
Addended by: Marcelino Duster on: 05/25/2020 10:44 AM   Modules accepted: Orders

## 2020-05-25 NOTE — Telephone Encounter (Signed)
Left her correction scale at her house and is now in [place]. Requests a call back. Sent her an email with her correction scale.

## 2020-05-25 NOTE — Telephone Encounter (Signed)
Pt aware.

## 2020-06-04 ENCOUNTER — Telehealth: Payer: Self-pay | Admitting: Orthopedic Surgery

## 2020-06-04 NOTE — Telephone Encounter (Signed)
11/11/19 DOS faxed to [facility]. Pull list processed by [employer]. refaxed [phone]

## 2020-06-13 ENCOUNTER — Other Ambulatory Visit: Payer: Self-pay | Admitting: Internal Medicine

## 2020-06-13 NOTE — Telephone Encounter (Signed)
Thank you for clarifying, sent refill

## 2020-06-13 NOTE — Telephone Encounter (Signed)
Pt currently in [place]-will not return to [place] until around 07/4.  She will use [employer] [facility]) for the trazodone refill.  Pt wanted to make sure rx was there when she arrived home as there is a delay with the mail and she has family members that will put it away for her if it comes before she gets back.   CMA informed pt that if something happenes to rx (lost or stolen) while she is away, it would not be refilled.  Will send request to attending review.Regenia Skeeter, Martha Soltys Cass[place]6/23/202110:57 AM

## 2020-06-13 NOTE — Telephone Encounter (Signed)
It appears she recently went to [place]. Is she still there? It also appears [facility] left. I think we need to clarify if she needs refills and if so, to which pharmacy. Thanks!

## 2020-06-13 NOTE — Telephone Encounter (Signed)
CMA attempted to contact pt-no answer, message left on recorder for return call.Jennifer [name], Jennifer Holston [place]6/23/202110:26 AM

## 2020-06-14 ENCOUNTER — Encounter: Payer: Self-pay | Admitting: *Deleted

## 2020-06-29 ENCOUNTER — Other Ambulatory Visit: Payer: Self-pay

## 2020-06-29 DIAGNOSIS — E1151 Type 2 diabetes mellitus with diabetic peripheral angiopathy without gangrene: Secondary | ICD-10-CM

## 2020-06-29 DIAGNOSIS — IMO0002 Reserved for concepts with insufficient information to code with codable children: Secondary | ICD-10-CM

## 2020-06-29 MED ORDER — TRESIBA FLEXTOUCH 200 UNIT/ML ~~LOC~~ SOPN: 70.0000 [IU] | PEN_INJECTOR | Freq: Every day | SUBCUTANEOUS | 2 refills | Status: DC

## 2020-07-09 ENCOUNTER — Other Ambulatory Visit: Payer: Self-pay | Admitting: *Deleted

## 2020-07-09 MED ORDER — OXYCODONE-ACETAMINOPHEN 5-325 MG PO TABS: ORAL_TABLET | ORAL | 0 refills | Status: DC

## 2020-07-09 NOTE — Telephone Encounter (Signed)
I have filled this prescription for her.

## 2020-07-09 NOTE — Telephone Encounter (Signed)
Pls is calling back [phone]

## 2020-07-09 NOTE — Telephone Encounter (Signed)
Patient called in requesting her usual 3 month supply of oxycodone. Last sent 04/30/2020. Last OV 05/11/2020 in [facility]. No future appts with Provider. Last UDS 06/16/2019 with [facility].  Patient states she spoke with previous PCP and requested to be transitioned to another female PCP. Will send to Engineer, building services. [name], BSN, RN-BC

## 2020-07-11 ENCOUNTER — Telehealth: Payer: Self-pay

## 2020-07-11 NOTE — Telephone Encounter (Signed)
Unable to reach pt, to notified the disability parking placard is ready for her to pick it up.

## 2020-07-23 ENCOUNTER — Telehealth: Payer: Medicare Other

## 2020-07-25 ENCOUNTER — Ambulatory Visit: Payer: Medicare Other

## 2020-07-25 ENCOUNTER — Telehealth: Payer: Self-pay

## 2020-07-25 DIAGNOSIS — E1169 Type 2 diabetes mellitus with other specified complication: Secondary | ICD-10-CM

## 2020-07-25 DIAGNOSIS — E785 Hyperlipidemia, unspecified: Secondary | ICD-10-CM

## 2020-07-25 DIAGNOSIS — I152 Hypertension secondary to endocrine disorders: Secondary | ICD-10-CM

## 2020-07-25 DIAGNOSIS — IMO0002 Reserved for concepts with insufficient information to code with codable children: Secondary | ICD-10-CM

## 2020-07-25 DIAGNOSIS — E1159 Type 2 diabetes mellitus with other circulatory complications: Secondary | ICD-10-CM

## 2020-07-25 DIAGNOSIS — E1151 Type 2 diabetes mellitus with diabetic peripheral angiopathy without gangrene: Secondary | ICD-10-CM

## 2020-07-25 NOTE — Progress Notes (Signed)
Internal Medicine Clinic Resident  I have personally reviewed this encounter including the documentation in this note and/or discussed this patient with the care management provider. I will address any urgent items identified by the care management provider and will communicate my actions to the patient's PCP. I have reviewed the patient's CCM visit with my supervising attending, Dr Daryll Drown.  Virl Axe, MD 07/25/2020

## 2020-07-25 NOTE — Patient Instructions (Signed)
Visit Information  Goals Addressed              This Visit's Progress   .  "I would like to live on my own." (pt-stated)        [place] (see longtitudinal plan of care for additional care plan information)  Current Barriers:  . Housing barriers  Social Work Clinical Goal(s):  Marland Kitchen Over the next 60 days, patient will work with SW to address concerns related to housing  Interventions: . Contacted patient today to follow up on housing resources previously provided.  Patient requested office visit to discuss in person  Patient Self Care Activities:  . Attends all scheduled provider appointments . Performs ADL's independently . Calls provider office for new concerns or questions   Please see past updates related to this goal by clicking on the "Past Updates" button in the selected goal         Patient verbalizes understanding of instructions provided today.   Face to Face appointment with care management team member scheduled for: 07/31/20      Ronn Melena, [place] Coordination Social Worker [place] [phone]

## 2020-07-25 NOTE — Chronic Care Management (AMB) (Signed)
  Care Management   Follow Up Note   07/25/2020 Name: Jennifer [name] MRN: [id number] DOB: 02/05/1961  Referred by: Axel Filler, MD Reason for referral : Care Coordination (Housing)    Jennifer [name] is a 59 y.o. year old female who is a primary care patient of Axel Filler, MD. The care management team was consulted for assistance with care management and care coordination needs.    Review of patient status, including review of consultants reports, relevant laboratory and other test results, and collaboration with appropriate care team members and the patient's provider was performed as part of comprehensive patient evaluation and provision of chronic care management services.    SDOH (Social Determinants of Health) assessments performed: No See Care Plan activities for detailed interventions related to [facility])     Advanced Directives: See Care Plan and Vynca application for related entries.   Goals Addressed              This Visit's Progress   .  "I would like to live on my own." (pt-stated)        [place] (see longtitudinal plan of care for additional care plan information)  Current Barriers:  . Housing barriers  Social Work Clinical Goal(s):  Marland Kitchen Over the next 60 days, patient will work with SW to address concerns related to housing  Interventions: . Contacted patient today to follow up on housing resources previously provided.  Patient requested office visit to discuss in person  Patient Self Care Activities:  . Attends all scheduled provider appointments . Performs ADL's independently . Calls provider office for new concerns or questions   Please see past updates related to this goal by clicking on the "Past Updates" button in the selected goal          Face to Face appointment with care management team member scheduled for: 07/31/20 @ 1:00PM   Alven Alverio, [place] Coordination Social Worker [place] [phone]

## 2020-07-25 NOTE — Telephone Encounter (Signed)
Informed patient during CCM follow up call that disability parking placard is at clinic for pick up.     Jennifer [name], [place] Coordination Social Worker [place] [phone]

## 2020-07-27 ENCOUNTER — Ambulatory Visit: Payer: Medicare Other | Admitting: *Deleted

## 2020-07-27 DIAGNOSIS — E1159 Type 2 diabetes mellitus with other circulatory complications: Secondary | ICD-10-CM

## 2020-07-27 DIAGNOSIS — Z89512 Acquired absence of left leg below knee: Secondary | ICD-10-CM

## 2020-07-27 DIAGNOSIS — IMO0002 Reserved for concepts with insufficient information to code with codable children: Secondary | ICD-10-CM

## 2020-07-27 DIAGNOSIS — E1169 Type 2 diabetes mellitus with other specified complication: Secondary | ICD-10-CM

## 2020-07-27 DIAGNOSIS — I152 Hypertension secondary to endocrine disorders: Secondary | ICD-10-CM

## 2020-07-27 DIAGNOSIS — E1151 Type 2 diabetes mellitus with diabetic peripheral angiopathy without gangrene: Secondary | ICD-10-CM

## 2020-07-27 DIAGNOSIS — E785 Hyperlipidemia, unspecified: Secondary | ICD-10-CM
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
## 2020-07-30 ENCOUNTER — Telehealth: Payer: Self-pay

## 2020-07-30 NOTE — Telephone Encounter (Signed)
  Chronic Care Management   Outreach Note  07/30/2020 Name: Jennifer [name] MRN: [id number] DOB: Apr 01, 1961  Referred by: Axel Filler, MD Reason for referral : Care Coordination (Housing)   Incoming call from patient requesting to reschedule office visit.   Office visit rescheduled for 08/01/20 @ 1:00 PM.       Ronn Melena, [place] Coordination Social Worker [place] [phone]

## 2020-07-31 ENCOUNTER — Ambulatory Visit: Payer: Medicare Other

## 2020-07-31 NOTE — Progress Notes (Signed)
Internal Medicine Clinic Resident  I have personally reviewed this encounter including the documentation in this note and/or discussed this patient with the care management provider. Patient needs lipid panel at next clinic visit due to history of DM, not on statin therapy. I will address any urgent items identified by the care management provider and will communicate my actions to the patient's PCP. I have reviewed the patient's CCM visit with my supervising attending, Dr Daryll Drown.  Virl Axe, MD 07/31/2020

## 2020-08-01 NOTE — Progress Notes (Signed)
Internal Medicine Clinic Attending  CCM services provided by the care management provider and their documentation were discussed with Dr. [name]. We reviewed the pertinent findings, urgent action items addressed by the resident and non-urgent items to be addressed by the PCP.  I agree with the assessment, diagnosis, and plan of care documented in the CCM and resident's note.  Gilles Chiquito, MD 08/01/2020

## 2020-08-02 ENCOUNTER — Ambulatory Visit: Payer: Medicare Other

## 2020-08-02 DIAGNOSIS — E1159 Type 2 diabetes mellitus with other circulatory complications: Secondary | ICD-10-CM

## 2020-08-02 DIAGNOSIS — E1169 Type 2 diabetes mellitus with other specified complication: Secondary | ICD-10-CM

## 2020-08-02 DIAGNOSIS — E1151 Type 2 diabetes mellitus with diabetic peripheral angiopathy without gangrene: Secondary | ICD-10-CM

## 2020-08-02 DIAGNOSIS — IMO0002 Reserved for concepts with insufficient information to code with codable children: Secondary | ICD-10-CM

## 2020-08-02 DIAGNOSIS — E785 Hyperlipidemia, unspecified: Secondary | ICD-10-CM

## 2020-08-02 NOTE — Patient Instructions (Signed)
Visit Information  Goals Addressed              This Visit's Progress   .  "I need a power wheelchair" (pt-stated)        [place] (see longtitudinal plan of care for additional care plan information)  Current Barriers:  . ADL IADL limitations  Patient previously approved for power wheelchair after appeal was approved by [employer].  Patient did not obtain chair, however, because of the out-of-pocket cost that was quoted to her by [facility].  Patient does have Medicaid in addition to Medicare  Clinical Social Work Clinical Goal(s):  Marland Kitchen Over the next 30 days, patient will work with SW to address concerns related to need for DME; power wheelchair  Interventions: . BSW and patient contacted [employer] to determine if Medicaid covers cost of DME that is not covered by [employer]; per Radiation protection practitioner, it is typically covered if the supplier bills to Medicaid . Collaborated with [facility] Administrative Assistant, Lacy Duverney, regarding patient's Medicaid policy as it is not shown in [place]; patient has [place] Medicaid which only covers cost of Medicare premiums . Messaged [employer] Giving Committee to determine if assistance with out-of-pocket cost for wheelchair is appropriate request for committee.   . Patient Self Care Activities:  . Performs ADL's independently . Unable to perform IADLs independently  Please see past updates related to this goal by clicking on the "Past Updates" button in the selected goal      .  COMPLETED: "I would like to live on my own." (pt-stated)        [place] (see longtitudinal plan of care for additional care plan information)  Current Barriers:  . Housing barriers  Social Work Clinical Goal(s):  Marland Kitchen Over the next 60 days, patient will work with SW to address concerns related to housing  Interventions: Marland Kitchen Goal of care plan being closed.  Although patient is not particularly satisfied with housing situation, she  doe not wish to relocate at this time.    Patient Self Care Activities:  . Attends all scheduled provider appointments . Performs ADL's independently . Calls provider office for new concerns or questions   Please see past updates related to this goal by clicking on the "Past Updates" button in the selected goal         Patient verbalizes understanding of instructions provided today.   The patient has been provided with contact information for the care management team and has been advised to call with any health related questions or concerns.      Ronn Melena, [place] Coordination Social Worker [place] [phone]

## 2020-08-02 NOTE — Chronic Care Management (AMB) (Signed)
Care Management   Follow Up Note   08/02/2020 Name: Jennifer [name] MRN: [id number] DOB: May 11, 1961  Referred by: Jennifer Filler, MD Reason for referral : Care Coordination (Housing, DME)   Jennifer [name] is a 59 y.o. year old female who is a primary care patient of Jennifer Filler, MD. The care management team was consulted for assistance with care management and care coordination needs.    Review of patient status, including review of consultants reports, relevant laboratory and other test results, and collaboration with appropriate care team members and the patient's provider was performed as part of comprehensive patient evaluation and provision of chronic care management services.    SDOH (Social Determinants of Health) assessments performed: No See Care Plan activities for detailed interventions related to [facility])     Advanced Directives: See Care Plan and Vynca application for related entries.   Goals Addressed              This Visit's Progress   .  "I need a power wheelchair" (pt-stated)        [name] (see longtitudinal plan of care for additional care plan information)  Current Barriers:  . ADL IADL limitations  Patient previously approved for power wheelchair after appeal was approved by [employer].  Patient did not obtain chair, however, because of the out-of-pocket cost that was quoted to her by [facility].  Patient does have Medicaid in addition to Medicare  Clinical Social Work Clinical Goal(s):  Marland Kitchen Over the next 30 days, patient will work with SW to address concerns related to need for DME; power wheelchair  Interventions: . BSW and patient contacted [employer] to determine if Medicaid covers cost of DME that is not covered by [employer]; per Radiation protection practitioner, it is typically covered if the supplier bills to Medicaid . Collaborated with [facility] Administrative Assistant, Jennifer [name], regarding patient's  Medicaid policy as it is not shown in [place]; patient has [place] Medicaid which only covers cost of Medicare premiums . Messaged [employer] Giving Committee to determine if assistance with out-of-pocket cost for wheelchair is appropriate request for committee.   . Patient Self Care Activities:  . Performs ADL's independently . Unable to perform IADLs independently  Please see past updates related to this goal by clicking on the "Past Updates" button in the selected goal      .  COMPLETED: "I would like to live on my own." (pt-stated)        [name] (see longtitudinal plan of care for additional care plan information)  Current Barriers:  . Housing barriers  Social Work Clinical Goal(s):  Marland Kitchen Over the next 60 days, patient will work with SW to address concerns related to housing  Interventions: Marland Kitchen Goal of care plan being closed.  Although patient is not particularly satisfied with housing situation, she doe not wish to relocate at this time.    Patient Self Care Activities:  . Attends all scheduled provider appointments . Performs ADL's independently . Calls provider office for new concerns or questions   Please see past updates related to this goal by clicking on the "Past Updates" button in the selected goal          The patient has been provided with contact information for the care management team and has been advised to call with any health/community resource related questions or concerns.      Geographical information systems officer, [place] [phone]

## 2020-08-02 NOTE — Progress Notes (Signed)
Internal Medicine Clinic Resident  I have personally reviewed this encounter including the documentation in this note and/or discussed this patient with the care management provider. I will address any urgent items identified by the care management provider and will communicate my actions to the patient's PCP. I have reviewed the patient's CCM visit with my supervising attending, Dr Heber Halsey.  Sanjuana Letters, MD 08/02/2020

## 2020-08-03 ENCOUNTER — Other Ambulatory Visit: Payer: Self-pay

## 2020-08-03 DIAGNOSIS — E1121 Type 2 diabetes mellitus with diabetic nephropathy: Secondary | ICD-10-CM

## 2020-08-06 ENCOUNTER — Other Ambulatory Visit: Payer: Medicare Other | Admitting: Orthotics

## 2020-08-06 MED ORDER — INSULIN LISPRO (1 UNIT DIAL) 100 UNIT/ML (KWIKPEN): PEN_INJECTOR | SUBCUTANEOUS | 3 refills | Status: DC

## 2020-08-10 ENCOUNTER — Telehealth: Payer: Medicare Other

## 2020-08-13 ENCOUNTER — Telehealth: Payer: Self-pay | Admitting: Internal Medicine

## 2020-08-13 ENCOUNTER — Other Ambulatory Visit: Payer: Self-pay | Admitting: *Deleted

## 2020-08-13 DIAGNOSIS — E1151 Type 2 diabetes mellitus with diabetic peripheral angiopathy without gangrene: Secondary | ICD-10-CM

## 2020-08-13 DIAGNOSIS — IMO0002 Reserved for concepts with insufficient information to code with codable children: Secondary | ICD-10-CM

## 2020-08-13 DIAGNOSIS — E1121 Type 2 diabetes mellitus with diabetic nephropathy: Secondary | ICD-10-CM

## 2020-08-13 NOTE — Telephone Encounter (Signed)
Refill Request- Pt requesting someone call her pharmacy back. Pt states she can only remember the medications listed below but the pharmacy told her she had more meds to refill.  insulin degludec (TRESIBA FLEXTOUCH) 200 UNIT/ML FlexTouch Pen  insulin lispro (HUMALOG KWIKPEN) 100 UNIT/ML KwikPen  Insulin Pen Needle 32G X 4 MM MISC  Insulin Syringe-Needle U-100 31G X 15/64" 0.3 ML MISC  Per pt please call the following pharmacy:  [facility] [place]

## 2020-08-13 NOTE — Telephone Encounter (Signed)
Call placed to [facility]. Number entered for Agent to return call. [name], BSN, RN-BC

## 2020-08-13 NOTE — Telephone Encounter (Signed)
Sharee Pimple with [facility] called back. Requesting refills on nystatin powder, hydroxyzine, ozempic, humalog Quickpen, pen needles, one touch test strips and lancets.

## 2020-08-13 NOTE — Telephone Encounter (Signed)
2 of 2 encounters, closing

## 2020-08-14 ENCOUNTER — Ambulatory Visit: Payer: Medicare Other

## 2020-08-14 DIAGNOSIS — E1151 Type 2 diabetes mellitus with diabetic peripheral angiopathy without gangrene: Secondary | ICD-10-CM

## 2020-08-14 DIAGNOSIS — E1169 Type 2 diabetes mellitus with other specified complication: Secondary | ICD-10-CM

## 2020-08-14 DIAGNOSIS — E1159 Type 2 diabetes mellitus with other circulatory complications: Secondary | ICD-10-CM

## 2020-08-14 DIAGNOSIS — IMO0002 Reserved for concepts with insufficient information to code with codable children: Secondary | ICD-10-CM

## 2020-08-14 DIAGNOSIS — E785 Hyperlipidemia, unspecified: Secondary | ICD-10-CM

## 2020-08-14 MED ORDER — ONETOUCH VERIO VI STRP: 1.0000 | ORAL_STRIP | 0 refills | Status: DC | PRN

## 2020-08-14 MED ORDER — INSULIN LISPRO (1 UNIT DIAL) 100 UNIT/ML (KWIKPEN): PEN_INJECTOR | SUBCUTANEOUS | 3 refills | Status: DC

## 2020-08-14 MED ORDER — OZEMPIC (0.25 OR 0.5 MG/DOSE) 2 MG/1.5ML ~~LOC~~ SOPN: 0.5000 mg | PEN_INJECTOR | SUBCUTANEOUS | 2 refills | Status: DC

## 2020-08-14 MED ORDER — NYSTATIN 100000 UNIT/GM EX POWD: 1.0000 "application " | Freq: Three times a day (TID) | CUTANEOUS | 2 refills | Status: DC

## 2020-08-14 MED ORDER — HYDROXYZINE PAMOATE 25 MG PO CAPS: 50.0000 mg | ORAL_CAPSULE | Freq: Every day | ORAL | 2 refills | Status: AC

## 2020-08-14 MED ORDER — ONETOUCH ULTRASOFT LANCETS MISC: 12 refills | Status: DC

## 2020-08-14 MED ORDER — INSULIN PEN NEEDLE 32G X 4 MM MISC: 0 refills | Status: DC

## 2020-08-14 NOTE — Patient Instructions (Signed)
Visit Information  Goals Addressed              This Visit's Progress   .  "I need a power wheelchair" (pt-stated)        [place] (see longtitudinal plan of care for additional care plan information)  Current Barriers:  . ADL IADL limitations  Patient previously approved for power wheelchair after appeal was approved by [employer].  Patient did not obtain chair, however, because of the out-of-pocket cost that was quoted to her by [facility].  Patient does have Medicaid in addition to Medicare  Clinical Social Work Clinical Goal(s):  Marland Kitchen Over the next 30 days, patient will work with SW to address concerns related to need for DME; power wheelchair  Interventions: . Contacted patient regarding request for assistance with out-of-pocket portion for cost of power wheelchair . Directed patient to have someone assist her with determining brand name of chair that patient reports was gifted to her . Sent community message to Andria Rhein requesting suggestions for having patient's chair evaluated for possible repair  . Patient Self Care Activities:  . Performs ADL's independently . Unable to perform IADLs independently  Please see past updates related to this goal by clicking on the "Past Updates" button in the selected goal         Patient verbalizes understanding of instructions provided today.   The patient will call CCM BSW back with requested information about power wheelchair as advised during call today.      Ronn Melena, [place] Coordination Social Worker [place] [phone]

## 2020-08-14 NOTE — Progress Notes (Signed)
Internal Medicine Clinic Attending  CCM services provided by the care management provider and their documentation were discussed with Dr. [name]. We reviewed the pertinent findings, urgent action items addressed by the resident and non-urgent items to be addressed by the PCP.  I agree with the assessment, diagnosis, and plan of care documented in the CCM and resident's note.  Gilles Chiquito, MD 08/14/2020

## 2020-08-14 NOTE — Chronic Care Management (AMB) (Signed)
  Care Management   Follow Up Note   08/14/2020 Name: Jennifer [name] MRN: [id number] DOB: 1961/07/20  Referred by: Angelica Pou, MD Reason for referral : Care Coordination (DME)   Jennifer [name] is a 59 y.o. year old female who is a primary care patient of Angelica Pou, MD. The care management team was consulted for assistance with care management and care coordination needs.    Review of patient status, including review of consultants reports, relevant laboratory and other test results, and collaboration with appropriate care team members and the patient's provider was performed as part of comprehensive patient evaluation and provision of chronic care management services.    SDOH (Social Determinants of Health) assessments performed: No See Care Plan activities for detailed interventions related to [facility])     Advanced Directives: See Care Plan and Vynca application for related entries.   Goals Addressed              This Visit's Progress   .  "I need a power wheelchair" (pt-stated)        [place] (see longtitudinal plan of care for additional care plan information)  Current Barriers:  . ADL IADL limitations  Patient previously approved for power wheelchair after appeal was approved by [employer].  Patient did not obtain chair, however, because of the out-of-pocket cost that was quoted to her by [facility].  Patient does have Medicaid in addition to Medicare  Clinical Social Work Clinical Goal(s):  Marland Kitchen Over the next 30 days, patient will work with SW to address concerns related to need for DME; power wheelchair  Interventions: . Contacted patient regarding request for assistance with out-of-pocket portion for cost of power wheelchair . Directed patient to have someone assist her with determining brand name of chair that patient reports was gifted to her . Sent community message to Andria Rhein requesting suggestions for having patient's chair evaluated  for possible repair  . Patient Self Care Activities:  . Performs ADL's independently . Unable to perform IADLs independently  Please see past updates related to this goal by clicking on the "Past Updates" button in the selected goal          The patient will call CCM BSW back with requested information about power wheelchair as advised during call today.       Ronn Melena, [place] Coordination Social Worker [place] [phone]

## 2020-08-14 NOTE — Telephone Encounter (Signed)
Agree with refills all

## 2020-08-14 NOTE — Telephone Encounter (Signed)
I'm not sure where to do that - it looks like the meds have already been ordered.  I may need some directions!

## 2020-08-15 NOTE — Progress Notes (Signed)
Internal Medicine Clinic Resident  I have personally reviewed this encounter including the documentation in this note and/or discussed this patient with the care management provider. I will address any urgent items identified by the care management provider and will communicate my actions to the patient's PCP. I have reviewed the patient's CCM visit with my supervising attending, Dr Vincent.  Sumedha Munnerlyn, MD 08/15/2020    

## 2020-08-16 NOTE — Progress Notes (Signed)
Internal Medicine Clinic Attending  CCM services provided by the care management provider and their documentation were discussed with Dr. Basaraba. We reviewed the pertinent findings, urgent action items addressed by the resident and non-urgent items to be addressed by the PCP.  I agree with the assessment, diagnosis, and plan of care documented in the CCM and resident's note.  Kaysea Raya Thomas Ashir Kunz, MD 08/16/2020  

## 2020-08-22 ENCOUNTER — Telehealth: Payer: Medicare Other

## 2020-08-22 ENCOUNTER — Telehealth: Payer: Self-pay

## 2020-08-22 NOTE — Telephone Encounter (Signed)
  Chronic Care Management   Outreach Note  08/22/2020 Name: MANE CONSOLO MRN: [id number] DOB: [birth year]  Referred by: Angelica Pou, MD Reason for referral : Care Coordination (DME)   An unsuccessful telephone outreach was attempted today. The patient was referred to the case management team for assistance with care management and care coordination.   Follow Up Plan: A HIPPA compliant phone message was left for the patient providing contact information and requesting a return call.        Ronn Melena, [place] Coordination Social Worker [place] [phone]

## 2020-08-29 ENCOUNTER — Ambulatory Visit: Payer: Medicare Other

## 2020-08-29 DIAGNOSIS — IMO0002 Reserved for concepts with insufficient information to code with codable children: Secondary | ICD-10-CM

## 2020-08-29 DIAGNOSIS — E1151 Type 2 diabetes mellitus with diabetic peripheral angiopathy without gangrene: Secondary | ICD-10-CM

## 2020-08-29 DIAGNOSIS — E1169 Type 2 diabetes mellitus with other specified complication: Secondary | ICD-10-CM

## 2020-08-29 DIAGNOSIS — E1159 Type 2 diabetes mellitus with other circulatory complications: Secondary | ICD-10-CM

## 2020-08-29 DIAGNOSIS — E785 Hyperlipidemia, unspecified: Secondary | ICD-10-CM

## 2020-08-29 NOTE — Patient Instructions (Signed)
Visit Information  Goals Addressed              This Visit's Progress   .  "I need a power wheelchair" (pt-stated)        [place] (see longtitudinal plan of care for additional care plan information)  Current Barriers:  . ADL IADL limitations  Patient previously approved for power wheelchair after appeal was approved by [employer].  Patient did not obtain chair, however, because of the out-of-pocket cost that was quoted to her by [facility].  Patient does have Medicaid in addition to Medicare . 08/29/20:  Patient was gifted a power wheelchair but states that it is in need of repair.    Clinical Social Work Clinical Goal(s):  Marland Kitchen Over the next 30 days, patient will work with SW to address concerns related to need for DME; power wheelchair  Interventions: . Provided patient with contact information for [employer] [phone]. [phone])and encouraged her to call about cost of assessing chair and needed repairs.   . Patient Self Care Activities:  . Performs ADL's independently . Unable to perform IADLs independently  Please see past updates related to this goal by clicking on the "Past Updates" button in the selected goal         Patient verbalizes understanding of instructions provided today.   The care management team will reach out to the patient again over the next 14 days.     Ronn Melena, [place] Coordination Social Worker [place] [phone]

## 2020-08-29 NOTE — Chronic Care Management (AMB) (Signed)
  Care Management   Follow Up Note   08/29/2020 Name: Jennifer [name] MRN: [id number] DOB: 1961/05/29  Referred by: Angelica Pou, MD Reason for referral : Care Coordination (DME)   Jennifer [name] is a 59 y.o. year old female who is a primary care patient of Angelica Pou, MD. The care management team was consulted for assistance with care management and care coordination needs.    Review of patient status, including review of consultants reports, relevant laboratory and other test results, and collaboration with appropriate care team members and the patient's provider was performed as part of comprehensive patient evaluation and provision of chronic care management services.    SDOH (Social Determinants of Health) assessments performed: No See Care Plan activities for detailed interventions related to [facility])     Advanced Directives: See Care Plan and Vynca application for related entries.   Goals Addressed              This Visit's Progress   .  "I need a power wheelchair" (pt-stated)        [place] (see longtitudinal plan of care for additional care plan information)  Current Barriers:  . ADL IADL limitations  Patient previously approved for power wheelchair after appeal was approved by [employer].  Patient did not obtain chair, however, because of the out-of-pocket cost that was quoted to her by [facility].  Patient does have Medicaid in addition to Medicare . 08/29/20:  Patient was gifted a power wheelchair but states that it is in need of repair.    Clinical Social Work Clinical Goal(s):  Marland Kitchen Over the next 30 days, patient will work with SW to address concerns related to need for DME; power wheelchair  Interventions: . Provided patient with contact information for [employer] [phone])and encouraged her to call about cost of assessing chair and needed repairs.   . Patient Self Care Activities:  . Performs ADL's  independently . Unable to perform IADLs independently  Please see past updates related to this goal by clicking on the "Past Updates" button in the selected goal          The care management team will reach out to the patient again over the next 14 days.      Ronn Melena, [place] Coordination Social Worker [place] [phone]

## 2020-09-05 ENCOUNTER — Other Ambulatory Visit: Payer: Self-pay | Admitting: *Deleted

## 2020-09-05 DIAGNOSIS — E1121 Type 2 diabetes mellitus with diabetic nephropathy: Secondary | ICD-10-CM

## 2020-09-05 MED ORDER — INSULIN PEN NEEDLE 32G X 4 MM MISC: 0 refills | Status: DC

## 2020-09-05 MED ORDER — BENAZEPRIL HCL 40 MG PO TABS: 40.0000 mg | ORAL_TABLET | Freq: Every day | ORAL | 3 refills | Status: DC

## 2020-09-06 ENCOUNTER — Ambulatory Visit: Payer: Medicare Other | Admitting: *Deleted

## 2020-09-06 DIAGNOSIS — Z89512 Acquired absence of left leg below knee: Secondary | ICD-10-CM

## 2020-09-06 DIAGNOSIS — IMO0002 Reserved for concepts with insufficient information to code with codable children: Secondary | ICD-10-CM

## 2020-09-06 DIAGNOSIS — E1169 Type 2 diabetes mellitus with other specified complication: Secondary | ICD-10-CM

## 2020-09-06 DIAGNOSIS — E1151 Type 2 diabetes mellitus with diabetic peripheral angiopathy without gangrene: Secondary | ICD-10-CM

## 2020-09-06 DIAGNOSIS — E1159 Type 2 diabetes mellitus with other circulatory complications: Secondary | ICD-10-CM

## 2020-09-06 DIAGNOSIS — E1121 Type 2 diabetes mellitus with diabetic nephropathy: Secondary | ICD-10-CM

## 2020-09-06 DIAGNOSIS — E785 Hyperlipidemia, unspecified: Secondary | ICD-10-CM

## 2020-09-06 NOTE — Patient Instructions (Signed)
Visit Information It was nice speaking with you today. I will ask the clinic staff about the referral to the speech therapist to address your stuttering.  Goals Addressed              This Visit's Progress     Patient Stated   .  " Do I need another referral to see a speech therapist for my stuttering?" (pt-stated)        [place] (see longitudinal plan of care for additional care plan information)  Current Barriers:  . Care Coordination needs related to speech therapy referral in a patient with IDDM, HF, HTN, COPD, Asthma, CKD- patient states she has had trouble with stuttering since she was a child, says she has never received therapy because she refused it, says she used to treat the stuttering with "stuff that's not legal in [place]" , she says recently her stuttering has gotten much worse and she was referred to a speech therapist in April by Dr [name] but she forgot the name and place for the therapist, she says she can only attend therapy appointments after 12 noon on  Monday-Thursday and after 2:00 pm on Friday.  Nurse Case Manager Clinical Goal(s):  Marland Kitchen Over the next 30-60 days, patient will work with the health care and CCM team to address needs related to speech therapist referral  Interventions:  . Inter-disciplinary care team collaboration (see longitudinal plan of care) . Collaborated with clinic staff and provider regarding speech therapy referral  Patient Self Care Activities:  . Patient verbalizes understanding of plan to work with clinic staff to secure referral to speech therapy . Unable to independently secure speech therapy referral  Initial goal documentation     .  " I've had diabetes since 1991 and I see [name] Plyler on a regular basis; she helps me manage my diabetes" (pt-stated)        [place] (see longitudinal plan of care for additional care plan information).  Current Barriers:  . Chronic Disease Management support, education, and  care coordination needs related to CHF, HTN, COPD, and DMII and rheumatoid arthritis- patient says she is doing OK at present with managing her chronic disease states, says she is receiving percutaneous tibial nerve stimulation every Friday for overactive bladder with urologist Dr Gloriann Loan, she says she can't really tell a difference yet but she just started the therapy   Case Manager Clinical Goal(s):  Marland Kitchen RNCM will work with patient to improve self management strategies for diabetes in order to meet presurgical A1C goal of <8  Interventions:  . Collaborated with BSW to initiate plan of care to address needs related to for  patient with CHF, HTN, COPD, and DMII and rheumatoid arthritis.   Bertram Savin care team collaboration (see longitudinal plan of care) . Evaluation of current treatment plan related to HTN and IDDM and patient's adherence to plan as established by provider. . Reviewed medications with patient and discussed medication taking behavior . Discussed plans with patient for ongoing care management follow up and provided patient with direct contact information for care management tea   Patient Self Care Activities:  . Patient will make appointments with eye MD, rheumatologist, and optometrist.  . Patient will check blood sugar prior to two meals and follow sliding scale Rx to improve blood sugar control and to assist with meeting criteria for Freestyle Li Please see past updates related to this goal by clicking on the "Past Updates" button in the  selected goal         The patient verbalized understanding of instructions provided today and declined a print copy of patient instruction materials.   The care management team will reach out to the patient again over the next 30-60 days.   Kelli Churn RN, CCM, [facility] RN Care Manager [phone]

## 2020-09-06 NOTE — Chronic Care Management (AMB) (Signed)
Chronic Care Management   Follow Up Note   09/06/2020 Name: Jennifer [name] MRN: [id number] DOB: June 30, 1961  Referred by: Angelica Pou, MD Reason for referral : Chronic Care Management ( IDDM, HF, HTN, COPD, Asthma, CKD)   Jennifer [name] is a 59 y.o. year old female who is a primary care patient of Angelica Pou, MD. The CCM team was consulted for assistance with chronic disease management and care coordination needs.    Review of patient status, including review of consultants reports, relevant laboratory and other test results, and collaboration with appropriate care team members and the patient's provider was performed as part of comprehensive patient evaluation and provision of chronic care management services.    SDOH (Social Determinants of Health) assessments performed: No See Care Plan activities for detailed interventions related to [facility])     Outpatient Encounter Medications as of 09/06/2020  Medication Sig  . omeprazole (PRILOSEC) 20 MG capsule TAKE 1 CAPSULE (20 MG TOTAL) BY MOUTH 2 (TWO) TIMES DAILY BEFORE A MEAL.  Marland Kitchen albuterol (PROAIR HFA) 108 (90 Base) MCG/ACT inhaler INHALE 2 PUFFS BY MOUTH EVERY 6 HOURS AS NEEDED FOR WHEEZING  . albuterol (PROVENTIL) (2.5 MG/3ML) 0.083% nebulizer solution Take 3 mLs (2.5 mg total) by nebulization every 6 (six) hours as needed for wheezing or shortness of breath. (Patient not taking: Reported on 02/28/2020)  . amLODipine (NORVASC) 10 MG tablet Take 1 tablet (10 mg total) by mouth daily.  Marland Kitchen aspirin EC 81 MG tablet Take 81 mg by mouth daily.  Marland Kitchen atenolol (TENORMIN) 100 MG tablet Take 1 tablet (100 mg total) by mouth daily.  . baclofen (LIORESAL) 10 MG tablet TAKE 1 TABLET BY MOUTH THREE TIMES A DAY AS NEEDED FOR MUSCLE SPASMS  . benazepril (LOTENSIN) 40 MG tablet Take 1 tablet (40 mg total) by mouth daily.  . benzonatate (TESSALON PERLES) 100 MG capsule Take 1 capsule (100 mg total) by mouth at bedtime as needed for cough.  (Patient not taking: Reported on 02/28/2020)  . Biotin 5 MG TABS Take 5 mg by mouth daily.  . Blood Glucose Monitoring Suppl ([place]) w/Device KIT Check 4 times a day  . buPROPion (WELLBUTRIN XL) 300 MG 24 hr tablet Take 1 tablet (300 mg total) by mouth daily.  . cholecalciferol (VITAMIN D3) 25 MCG (1000 UT) tablet Take 1 tablet (1,000 Units total) by mouth daily.  . fluticasone (FLONASE) 50 MCG/ACT nasal spray INSTILL 1 SPRAY IN EACH NOSTRIL DAILY (Patient not taking: Reported on 02/28/2020)  . glucose blood (ONETOUCH VERIO) test strip 1 each by Other route as needed for other. Use as instructed. Tests 3-4 times daily.  . hydrochlorothiazide (HYDRODIURIL) 25 MG tablet Take 1 tablet (25 mg total) by mouth daily.  . hydrOXYzine (VISTARIL) 25 MG capsule Take 2 capsules (50 mg total) by mouth daily.  . insulin degludec (TRESIBA FLEXTOUCH) 200 UNIT/ML FlexTouch Pen Inject 70 Units into the skin daily.  . insulin lispro (HUMALOG KWIKPEN) 100 UNIT/ML KwikPen IF BLOOD SUGAR IS < 175 DO NOT TAKE ANY CORRECTION INSULIN. 176-225 INJECT 2 UNITS. 226-275 INJECT 4 UNITS. 695-072 INJECT 6 UNITS. 326-375 INJECT 8 UNITS. 257-505 INJECT 10 UNITS. 426-475 INJECT 12 UNITS. 183-358 INJECT 14 UNITS AND CALL OFFICE FOR FURTHER INSTRUCTIONS.  . Insulin Pen Needle 32G X 4 MM MISC Use to inject insulin 4 times a day and semaglutide once weekly  . Insulin Syringe-Needle U-100 31G X 15/64" 0.3 ML MISC Use with Humalog to correct  blood sugar before meals three times a day  . ipratropium (ATROVENT) 0.02 % nebulizer solution USE 1 VIAL VIA NEBULIZER EVERY 6 HOURS AS NEEDED FOR WHEEZING (Patient not taking: Reported on 02/28/2020)  . Lancets (ONETOUCH ULTRASOFT) lancets Use as instructed  . metFORMIN (GLUCOPHAGE-XR) 500 MG 24 hr tablet 1 tablet daily with breakfast  . mirabegron ER (MYRBETRIQ) 50 MG TB24 tablet Take 1 tablet (50 mg total) by mouth daily.  . Multiple Vitamin (MULTIVITAMIN WITH MINERALS) TABS tablet  Take 1 tablet by mouth daily.  Marland Kitchen nystatin (NYSTATIN) powder Apply 1 application topically 3 (three) times daily.  . Omega-3 Fatty Acids (FISH OIL) 1000 MG CAPS Take 1 capsule (1,000 mg total) by mouth daily.  . ondansetron (ZOFRAN) 4 MG tablet Take 1 tablet (4 mg total) by mouth every 6 (six) hours.  Marland Kitchen oxyCODONE-acetaminophen (PERCOCET/ROXICET) 5-325 MG tablet Take 1 tablet by mouth every 8 (eight) hours as needed. May take a 4th pill if severe pain (up to 10 days monthly) #100 equals 30 day supply  . pregabalin (LYRICA) 200 MG capsule TAKE 1 CAPSULE BY MOUTH THREE TIMES A DAY  . promethazine (PHENERGAN) 25 MG tablet Take 1 tablet (25 mg total) by mouth every 6 (six) hours as needed for nausea or vomiting. (Patient not taking: Reported on 02/28/2020)  . rosuvastatin (CRESTOR) 20 MG tablet Take 1 tablet (20 mg total) by mouth at bedtime.  . Semaglutide,0.25 or 0.5MG/DOS, (OZEMPIC, 0.25 OR 0.5 MG/DOSE,) 2 MG/1.5ML SOPN Inject 0.375 mLs (0.5 mg total) into the skin once a week.  . traZODone (DESYREL) 100 MG tablet TAKE 1 TABLET BY MOUTH AT BEDTIME AS NEEDED FOR SLEEP  . umeclidinium-vilanterol (ANORO ELLIPTA) 62.5-25 MCG/INH AEPB Inhale 1 puff into the lungs daily.  . vitamin B-12 (CYANOCOBALAMIN) 500 MCG tablet Take 500 mcg by mouth daily.  . vitamin C (ASCORBIC ACID) 500 MG tablet Take 500 mg by mouth daily.  . [DISCONTINUED] Potassium Chloride ER 20 MEQ TBCR Take 40 mEq by mouth daily.   No facility-administered encounter medications on file as of 09/06/2020.     Objective:  Lab Results  Component Value Date   HGBA1C 8.5 (A) 04/12/2020   HGBA1C 9.3 (H) 11/11/2019   HGBA1C 8.9 (A) 08/18/2019   Lab Results  Component Value Date   MICROALBUR 56.5 (H) 10/05/2014   LDLCALC 77 06/05/2016   CREATININE 1.20 (H) 04/25/2020   Wt Readings from Last 3 Encounters:  05/11/20 258 lb 1.6 oz (117.1 kg)  04/25/20 261 lb 6.4 oz (118.6 kg)  04/16/20 258 lb 3.2 oz (117.1 kg)   BP Readings from Last 3  Encounters:  05/11/20 (!) 146/56  04/25/20 138/68  04/16/20 (!) 132/58    Goals Addressed              This Visit's Progress     Patient Stated   .  " Do I need another referral to see a speech therapist for my stuttering?" (pt-stated)        [place] (see longitudinal plan of care for additional care plan information)  Current Barriers:  . Care Coordination needs related to speech therapy referral in a patient with IDDM, HF, HTN, COPD, Asthma, CKD- patient states she has had trouble with stuttering since she was a child, says she has never received therapy because she refused it, says she used to treat the stuttering with "stuff that's not legal in [place]" , she says recently her stuttering has gotten much worse and  she was referred to a speech therapist in April by Dr [name] but she forgot the name and place for the therapist, she says she can only attend therapy appointments after 12 noon on  Monday-Thursday and after 2:00 pm on Friday.  Nurse Case Manager Clinical Goal(s):  Marland Kitchen Over the next 30-60 days, patient will work with the health care and CCM team to address needs related to speech therapist referral  Interventions:  . Inter-disciplinary care team collaboration (see longitudinal plan of care) . Collaborated with clinic staff and provider regarding speech therapy referral  Patient Self Care Activities:  . Patient verbalizes understanding of plan to work with clinic staff to secure referral to speech therapy . Unable to independently secure speech therapy referral  Initial goal documentation     .  " I've had diabetes since 1991 and I see [name] Plyler on a regular basis; she helps me manage my diabetes" (pt-stated)        [place] (see longitudinal plan of care for additional care plan information).  Current Barriers:  . Chronic Disease Management support, education, and care coordination needs related to CHF, HTN, COPD, and DMII and rheumatoid  arthritis- patient says she is doing OK at present with managing her chronic disease states, says she is receiving percutaneous tibial nerve stimulation every Friday for overactive bladder with urologist Dr Gloriann Loan, she says she can't really tell a difference yet but she just started the therapy   Case Manager Clinical Goal(s):  Marland Kitchen RNCM will work with patient to improve self management strategies for diabetes in order to meet presurgical A1C goal of <8  Interventions:  . Collaborated with BSW to initiate plan of care to address needs related to for  patient with CHF, HTN, COPD, and DMII and rheumatoid arthritis.   Bertram Savin care team collaboration (see longitudinal plan of care) . Evaluation of current treatment plan related to HTN and IDDM and patient's adherence to plan as established by provider. . Reviewed medications with patient and discussed medication taking behavior . Discussed plans with patient for ongoing care management follow up and provided patient with direct contact information for care management tea   Patient Self Care Activities:  . Patient will make appointments with eye MD, rheumatologist, and optometrist.  . Patient will check blood sugar prior to two meals and follow sliding scale Rx to improve blood sugar control and to assist with meeting criteria for Freestyle Li Please see past updates related to this goal by clicking on the "Past Updates" button in the selected goal          Plan:   The care management team will reach out to the patient again over the next 30-60 days.    Kelli Churn RN, CCM, [facility] RN Care Manager [phone]

## 2020-09-07 ENCOUNTER — Telehealth: Payer: Self-pay | Admitting: *Deleted

## 2020-09-07 ENCOUNTER — Other Ambulatory Visit: Payer: Self-pay | Admitting: Student

## 2020-09-07 ENCOUNTER — Other Ambulatory Visit: Payer: Medicare Other

## 2020-09-07 ENCOUNTER — Other Ambulatory Visit: Payer: Self-pay | Admitting: Sleep Medicine

## 2020-09-07 DIAGNOSIS — Z20822 Contact with and (suspected) exposure to covid-19: Secondary | ICD-10-CM

## 2020-09-07 NOTE — Progress Notes (Unsigned)
no

## 2020-09-07 NOTE — Progress Notes (Signed)
Internal Medicine Clinic Resident  I have personally reviewed this encounter including the documentation in this note and/or discussed this patient with the care management provider. I will address any urgent items identified by the care management provider and will communicate my actions to the patient's PCP.   Contacted patient by phone to discuss her request for a speech therapy referral. She had a referral placed in April of 2021 by Dr. [name], however her insurance does not require a referral to attend this facility. I provided the name, address and phone number to the [place] facility to schedule an appointment.  I have reviewed the patient's CCM visit with my supervising attending, Dr Heber Haswell.  Foy [place], MD 09/07/2020

## 2020-09-07 NOTE — Telephone Encounter (Signed)
Patient called and stated that her niece was at her house Sunday and Monday and tested positive for covid and patient stated that she went and got tested today and I stated to wait and see if the test come back negative then patient can come in on Monday but if test is not back then patient needs to call and reschedule. Lattie Haw

## 2020-09-10 ENCOUNTER — Ambulatory Visit (INDEPENDENT_AMBULATORY_CARE_PROVIDER_SITE_OTHER): Payer: Medicare Other | Admitting: Podiatry

## 2020-09-10 ENCOUNTER — Other Ambulatory Visit: Payer: Self-pay

## 2020-09-10 DIAGNOSIS — M79674 Pain in right toe(s): Secondary | ICD-10-CM | POA: Diagnosis not present

## 2020-09-10 DIAGNOSIS — B351 Tinea unguium: Secondary | ICD-10-CM | POA: Diagnosis not present

## 2020-09-10 DIAGNOSIS — L989 Disorder of the skin and subcutaneous tissue, unspecified: Secondary | ICD-10-CM | POA: Diagnosis not present

## 2020-09-10 DIAGNOSIS — E0843 Diabetes mellitus due to underlying condition with diabetic autonomic (poly)neuropathy: Secondary | ICD-10-CM

## 2020-09-10 DIAGNOSIS — Z89512 Acquired absence of left leg below knee: Secondary | ICD-10-CM

## 2020-09-10 LAB — NOVEL CORONAVIRUS, NAA: SARS-CoV-2, NAA: NOT DETECTED

## 2020-09-10 NOTE — Progress Notes (Signed)
Subjective: Patient is a 59 y.o. female with PMHx of diabetes mellitus and LLE BKA presenting to the office today with a chief complaint of painful callus lesion(s) noted to the right foot that has been present for the past year. Walking and applying pressure to the foot increases the pain. She has not had any treatment for the symptoms.  Patient also complains of elongated, thickened nails that cause pain while ambulating in shoes. She is unable to trim her own nails.   Past Medical History:  Diagnosis Date  . Anginal pain [facility])    '3' of 10 ischemia ruled out 9/9   . Arthritis of lumbar spine   . Asthma   . Cataract   . CHF (congestive heart failure) ([place])   . 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 Chronic bronchitis ([place])    "I get it alot" (09/28/2013)  . Chronic diastolic heart failure (HCC)    grade 2 per 2D echocardiogram (01/2013)  . Chronic lower back pain   . Chronic osteomyelitis of foot (HCC)    chronic, right secondary to diabetic foot ulcers  . Chronic pain syndrome 12/03/2011   Likely secondary to depression, "fibromyalgia", neuropathy, and obesity. Lumbar MRI 2014 no sig change from prior (2008) : Stable hypertrophic facet disease most notable at L4-5. Stable shallow left foraminal/extraforaminal disc protrusion at L4-5. No direct neural compression.      Marland Kitchen COPD 01/08/2007   PFT's 05/2007 : FEV1/FVC 82, FEV1 64% pred, FEF 25-75% 40% predicted, 16% improvement in FEV1 with bronchodilators.     . Depression   . Diabetes mellitus without complication ([place])    Type II  . Diabetic peripheral neuropathy ([place])   . DVT of upper extremity (deep vein thrombosis) ([place]) 03/11/2013   Secondary to PICC line. Right brachial vein, diagnosed on 03/10/2013 Coumadin for 3 months. End date 06/10/2013   . Dyspnea    "smoker"  . Environmental allergies    Hx: of  . Exertional shortness of breath   . Fatty liver 2003   observed on ultrasound abdomen  . Fibromyalgia   .  GERD (gastroesophageal reflux disease)   . Glaucoma   . History of use of hearing aid   . Hyperlipidemia   . Hyperplastic colon polyp 12/2010   Per colonoscopy (12/2010) - Dr. Deatra Ina  . Hypertension   . 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 Blood cultures 01/26/13 S. Aureus and GBS. Blood cultures Feb 6th, 8th, and 9th and March were negative.Repeat TEE 3/20 negative for vegitations  . 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 2/2 tobacco use, DM, and cont weight bearing on surgical wound and developed gangrene   . Pneumonia   . Polymicrobial bacterial infection 01/2013   GBS and S. aureus bacteremia // Source likely infected diabetic foot ulcer  . PVD (peripheral vascular disease) with claudication ([place])    Stents to bilateral common iliac arteries (left 2005, right 2008), on chronic plavix  . Rheumatoid arthritis ([place])   . S/P BKA (below knee amputation) unilateral [facility])    Oct 2014 L - failed limb preserving treatment. 2/2 tobacco use, DM, and cont weight bearing on surgical wound and developed gangrene   . Tobacco abuse   . Type II diabetes mellitus with peripheral circulatory disorders, uncontrolled DX: 1993   Insulin dep. Poor control. Complicated by diabetic  foot ulcer and diabetic eye disease.    Marland Kitchen Ulcer of foot, chronic (HCC)    Left. No OM per MRI (01/2013)    Objective:  Physical Exam General: Alert and oriented x3 in no acute distress  Dermatology: Hyperkeratotic lesion(s) present on the right foot. Pain on palpation with a central nucleated core noted. Skin is warm, dry and supple right lower extremities. Negative for open lesions or macerations. Nails are tender, long, thickened and dystrophic with subungual debris, consistent with onychomycosis, 1-5 right. No signs of infection noted.  Vascular: Palpable pedal pulses RLE. Moderate edema noted.  Capillary refill within normal limits.  Neurological: Epicritic and protective threshold diminished RLE.   Musculoskeletal Exam: Pain on palpation at the keratotic lesion(s) noted. Range of motion within normal limits RLE. Muscle strength 5/5 in all groups RLE.  Assessment: 1. Onychodystrophic nails 1-5 RLE with hyperkeratosis of nails.  2. Onychomycosis of nail due to dermatophyte RLE 3. Pre-ulcerative callus lesions noted to the right foot x 2 4. H/o BKA LLE   Plan of Care:  1. Patient evaluated. 2. Excisional debridement of keratoic lesion(s) using a chisel blade was performed without incident.  3. Dressed with light dressing. 4. Mechanical debridement of nails 1-5 right foot performed using a nail nipper. Filed with dremel without incident.  5. Appointment with Pedorthist for DM shoes.  6. Patient is to return to the clinic in 3 months.   Edrick Kins, DPM [facility]  Dr. Edrick Kins, [address]                                        [place], [place] [address]                Office [phone]  Fax [phone]

## 2020-09-12 ENCOUNTER — Ambulatory Visit: Payer: Medicare Other

## 2020-09-12 NOTE — Chronic Care Management (AMB) (Signed)
  Chronic Care Management   Outreach Note  09/12/2020 Name: Jennifer [name] MRN: [id number] DOB: 1961/11/22  Referred by: Angelica Pou, MD Reason for referral : Care Coordination (DME)  Contacted patient at 9:48 AM for follow up on need for DME repair.  Patient stated that she was unable to talk at time of call and that she would call back later.  Have not received return call as of yet.  Will attempt to reach her again next week if no return call.   Ronn Melena, [place] Coordination Social Worker [place] [phone]

## 2020-09-18 NOTE — Addendum Note (Signed)
Addended by: Hulan Fray on: 09/18/2020 03:58 PM   Modules accepted: Orders

## 2020-09-19 ENCOUNTER — Ambulatory Visit: Payer: Medicare Other

## 2020-09-19 DIAGNOSIS — IMO0002 Reserved for concepts with insufficient information to code with codable children: Secondary | ICD-10-CM

## 2020-09-19 DIAGNOSIS — E1169 Type 2 diabetes mellitus with other specified complication: Secondary | ICD-10-CM

## 2020-09-19 DIAGNOSIS — E1151 Type 2 diabetes mellitus with diabetic peripheral angiopathy without gangrene: Secondary | ICD-10-CM

## 2020-09-19 DIAGNOSIS — E785 Hyperlipidemia, unspecified: Secondary | ICD-10-CM

## 2020-09-19 DIAGNOSIS — E1159 Type 2 diabetes mellitus with other circulatory complications: Secondary | ICD-10-CM

## 2020-09-19 NOTE — Progress Notes (Addendum)
Internal Medicine Clinic Resident  I have personally reviewed this encounter including the documentation in this note and/or discussed this patient with the care management provider. I will address any urgent items identified by the care management provider and will communicate my actions to the patient's PCP. I have reviewed the patient's CCM visit with my supervising attending, Dr. Williams  Demetrion Wesby, DO 09/19/2020   Internal Medicine Attending: I have reviewed patient's case and agree with documentation. Julie Williams, MD   

## 2020-09-19 NOTE — Patient Instructions (Signed)
Visit Information  Goals Addressed              This Visit's Progress   .  "I need a power wheelchair" (pt-stated)        [place] (see longtitudinal plan of care for additional care plan information)  Current Barriers:  . ADL IADL limitations  Patient previously approved for power wheelchair after appeal was approved by [employer].  Patient did not obtain chair, however, because of the out-of-pocket cost that was quoted to her by [facility].  Patient does have Medicaid in addition to Medicare . 08/29/20:  Patient was gifted a power wheelchair but states that it is in need of repair.    Clinical Social Work Clinical Goal(s):  Marland Kitchen Over the next 30 days, patient will work with SW to address concerns related to need for DME; power wheelchair  Interventions: . Again provided patient with contact information for [employer] [phone]. [phone]) as she has not called about cost of assessing chair for needed repairs.   . Patient Self Care Activities:  . Performs ADL's independently . Unable to perform IADLs independently  Please see past updates related to this goal by clicking on the "Past Updates" button in the selected goal         Patient verbalizes understanding of instructions provided today.    Patient states that she will call back after contact Wheelchair Art gallery manager.   Ronn Melena, [place] Coordination Social Worker [place] [phone]

## 2020-09-19 NOTE — Chronic Care Management (AMB) (Signed)
  Care Management   Follow Up Note   09/19/2020 Name: Jennifer [name] MRN: [id number] DOB: 1961/12/10  Referred by: Angelica Pou, MD Reason for referral : Care Coordination (DME)   Jennifer [name] is a 59 y.o. year old female who is a primary care patient of Angelica Pou, MD. The care management team was consulted for assistance with care management and care coordination needs.    Review of patient status, including review of consultants reports, relevant laboratory and other test results, and collaboration with appropriate care team members and the patient's provider was performed as part of comprehensive patient evaluation and provision of chronic care management services.    SDOH (Social Determinants of Health) assessments performed: No See Care Plan activities for detailed interventions related to [facility])     Advanced Directives: See Care Plan and Vynca application for related entries.   Goals Addressed              This Visit's Progress   .  "I need a power wheelchair" (pt-stated)        [place] (see longtitudinal plan of care for additional care plan information)  Current Barriers:  . ADL IADL limitations  Patient previously approved for power wheelchair after appeal was approved by [employer].  Patient did not obtain chair, however, because of the out-of-pocket cost that was quoted to her by [facility].  Patient does have Medicaid in addition to Medicare . 08/29/20:  Patient was gifted a power wheelchair but states that it is in need of repair.    Clinical Social Work Clinical Goal(s):  Mar[place] Kitchen Over the next 30 days, patient will work with SW to address concerns related to need for DME; power wheelchair  Interventions: . Again provided patient with contact information for [employer] [phone]. [phone]) as she has not called about cost of assessing chair for needed repairs.   . Patient Self Care Activities:  . Performs ADL's  independently . Unable to perform IADLs independently  Please see past updates related to this goal by clicking on the "Past Updates" button in the selected goal          Patient states that she will call back after contacting [employer].       Ronn Melena, [place] Coordination Social Worker [place] [phone]

## 2020-09-21 ENCOUNTER — Other Ambulatory Visit: Payer: Self-pay

## 2020-09-21 MED ORDER — ONETOUCH VERIO VI STRP: 1.0000 | ORAL_STRIP | 0 refills | Status: DC | PRN

## 2020-10-04 ENCOUNTER — Telehealth: Payer: Self-pay | Admitting: Internal Medicine

## 2020-10-04 NOTE — Telephone Encounter (Signed)
Pls contact [phone];

## 2020-10-04 NOTE — Telephone Encounter (Signed)
Returned call to patient. States she thought she needed a Rx for oxycodone but [facility] called her and said they had one more Rx. She is aware to call back next month for further refills. [name], BSN, RN-BC

## 2020-10-08 ENCOUNTER — Ambulatory Visit: Payer: Medicare Other | Admitting: Podiatry

## 2020-10-09 ENCOUNTER — Telehealth: Payer: Medicare Other

## 2020-10-10 ENCOUNTER — Ambulatory Visit (INDEPENDENT_AMBULATORY_CARE_PROVIDER_SITE_OTHER): Payer: Medicare Other | Admitting: Podiatry

## 2020-10-10 ENCOUNTER — Other Ambulatory Visit: Payer: Self-pay

## 2020-10-10 DIAGNOSIS — E0843 Diabetes mellitus due to underlying condition with diabetic autonomic (poly)neuropathy: Secondary | ICD-10-CM

## 2020-10-10 DIAGNOSIS — L84 Corns and callosities: Secondary | ICD-10-CM

## 2020-10-10 DIAGNOSIS — Z89512 Acquired absence of left leg below knee: Secondary | ICD-10-CM | POA: Diagnosis not present

## 2020-10-10 NOTE — Progress Notes (Signed)
Subjective: Patient is a 59 y.o. 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  Patient states that it may be due to new socks that she got.  She is concerned and would like to have it evaluated.  She presents for further treatment evaluation  Past Medical History:  Diagnosis Date  . Anginal pain [facility])    '3' of 10 ischemia ruled out 9/9   . Arthritis of lumbar spine   . Asthma   . Cataract   . CHF (congestive heart failure) ([place])   . 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 Chronic bronchitis ([place])    "I get it alot" (09/28/2013)  . Chronic diastolic heart failure (HCC)    grade 2 per 2D echocardiogram (01/2013)  . Chronic lower back pain   . Chronic osteomyelitis of foot (HCC)    chronic, right secondary to diabetic foot ulcers  . Chronic pain syndrome 12/03/2011   Likely secondary to depression, "fibromyalgia", neuropathy, and obesity. Lumbar MRI 2014 no sig change from prior (2008) : Stable hypertrophic facet disease most notable at L4-5. Stable shallow left foraminal/extraforaminal disc protrusion at L4-5. No direct neural compression.      Marland Kitchen COPD 01/08/2007   PFT's 05/2007 : FEV1/FVC 82, FEV1 64% pred, FEF 25-75% 40% predicted, 16% improvement in FEV1 with bronchodilators.     . Depression   . Diabetes mellitus without complication ([place])    Type II  . Diabetic peripheral neuropathy ([place])   . DVT of upper extremity (deep vein thrombosis) ([place]) 03/11/2013   Secondary to PICC line. Right brachial vein, diagnosed on 03/10/2013 Coumadin for 3 months. End date 06/10/2013   . Dyspnea    "smoker"  . Environmental allergies    Hx: of  . Exertional shortness of breath   . Fatty liver 2003   observed on ultrasound abdomen  . Fibromyalgia   . GERD (gastroesophageal reflux disease)   . Glaucoma   .  History of use of hearing aid   . Hyperlipidemia   . Hyperplastic colon polyp 12/2010   Per colonoscopy (12/2010) - Dr. Deatra Ina  . Hypertension   . 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 Blood cultures 01/26/13 S. Aureus and GBS. Blood cultures Feb 6th, 8th, and 9th and March were negative.Repeat TEE 3/20 negative for vegitations  . 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 2/2 tobacco use, DM, and cont weight bearing on surgical wound and developed gangrene   . Pneumonia   . Polymicrobial bacterial infection 01/2013   GBS and S. aureus bacteremia // Source likely infected diabetic foot ulcer  . PVD (peripheral vascular disease) with claudication ([place])    Stents to bilateral common iliac arteries (left 2005, right 2008), on chronic plavix  . Rheumatoid arthritis ([place])   . S/P BKA (below knee amputation) unilateral [facility])    Oct 2014 L - failed limb preserving treatment. 2/2 tobacco use, DM, and cont weight bearing on surgical wound and developed gangrene   . Tobacco abuse   . Type II diabetes mellitus with peripheral circulatory disorders, uncontrolled DX: 1993   Insulin dep. Poor control. Complicated by diabetic foot ulcer and diabetic eye disease.    [place] Kitchen  Ulcer of foot, chronic (HCC)    Left. No OM per MRI (01/2013)    Objective:  Physical Exam General: Alert and oriented x3 in no acute distress  Dermatology: No open lesions noted today.  There is some discoloration with preulcerative skin lesion noted to the anterior aspect of the right ankle approximately 1 cm in diameter.  No open wound and the epidermis is intact.  Vascular: Palpable pedal pulses RLE. Moderate edema noted. Capillary refill within normal limits.  Neurological: Epicritic and protective threshold diminished RLE.   Musculoskeletal Exam: h/o BKA LLE. H/o partial 5th ray amputation RT.    Assessment: 1.  Diabetes mellitus with prior referral polyneuropathy  2.  Preulcerative callus right anterior ankle  3.  H/o BKA LLE.  Partial 5th ray amputation RT.    Plan of Care:  1. Patient evaluated. 2.  Silvadene cream was applied.  Recommend Silvadene cream daily 3.  Recommend socks that do not irritate the anterior ankle 4.  Appointment with Pedorthist for diabetic shoes 5.  Return to clinic as needed  Edrick Kins, DPM [facility]  Dr. Edrick Kins, [place] [place]                                        [place], [place] [address]                Office [phone]  Fax [phone]

## 2020-10-12 ENCOUNTER — Telehealth: Payer: Self-pay | Admitting: Internal Medicine

## 2020-10-12 NOTE — Telephone Encounter (Signed)
I was able to reach out to Ms. Pautler regarding her hyperglycemia after administering 20 units of insulin according to her sliding scale and she reports that it has improved to 175.  She expressed gratitude for the consultation that was provided.  Nathanial Rancher, MD [phone] Internal Medicine Teaching Service

## 2020-10-12 NOTE — Telephone Encounter (Signed)
Patient called because her blood sugars were 600.  She denies any symptoms at this time.  States she was informed to call if her sugars were this high.  Reports her sliding scale states to take up to 35 units of insulin in the 500 range.  Per chart review, states to inject 14 units if blood glucose is between 476-525 and 2 call office for further instructions.  Advised patient to inject 20 units at this time and we will follow-up.  -Advised inject 20 units of short acting insulin at this time -Follow-up in 1 to 2 hours

## 2020-10-15 ENCOUNTER — Other Ambulatory Visit: Payer: Self-pay

## 2020-10-15 ENCOUNTER — Other Ambulatory Visit: Payer: Self-pay | Admitting: *Deleted

## 2020-10-15 DIAGNOSIS — M62838 Other muscle spasm: Secondary | ICD-10-CM

## 2020-10-15 DIAGNOSIS — N3941 Urge incontinence: Secondary | ICD-10-CM

## 2020-10-15 DIAGNOSIS — F5104 Psychophysiologic insomnia: Secondary | ICD-10-CM

## 2020-10-15 DIAGNOSIS — E1159 Type 2 diabetes mellitus with other circulatory complications: Secondary | ICD-10-CM

## 2020-10-15 DIAGNOSIS — I152 Hypertension secondary to endocrine disorders: Secondary | ICD-10-CM

## 2020-10-15 DIAGNOSIS — E1151 Type 2 diabetes mellitus with diabetic peripheral angiopathy without gangrene: Secondary | ICD-10-CM

## 2020-10-15 DIAGNOSIS — IMO0002 Reserved for concepts with insufficient information to code with codable children: Secondary | ICD-10-CM

## 2020-10-15 DIAGNOSIS — E1165 Type 2 diabetes mellitus with hyperglycemia: Secondary | ICD-10-CM

## 2020-10-15 MED ORDER — HYDROCHLOROTHIAZIDE 25 MG PO TABS: 25.0000 mg | ORAL_TABLET | Freq: Every day | ORAL | 3 refills | Status: DC

## 2020-10-15 MED ORDER — PREGABALIN 200 MG PO CAPS: ORAL_CAPSULE | ORAL | 2 refills | Status: DC

## 2020-10-15 MED ORDER — BACLOFEN 10 MG PO TABS: ORAL_TABLET | ORAL | 2 refills | Status: DC

## 2020-10-15 MED ORDER — TRESIBA FLEXTOUCH 200 UNIT/ML ~~LOC~~ SOPN: 70.0000 [IU] | PEN_INJECTOR | Freq: Every day | SUBCUTANEOUS | 2 refills | Status: DC

## 2020-10-15 MED ORDER — ONETOUCH VERIO VI STRP: 1.0000 | ORAL_STRIP | 3 refills | Status: DC | PRN

## 2020-10-15 MED ORDER — TRAZODONE HCL 100 MG PO TABS: 100.0000 mg | ORAL_TABLET | Freq: Every evening | ORAL | 1 refills | Status: DC | PRN

## 2020-10-15 MED ORDER — MIRABEGRON ER 50 MG PO TB24: 50.0000 mg | ORAL_TABLET | Freq: Every day | ORAL | 3 refills | Status: DC

## 2020-10-15 NOTE — Telephone Encounter (Signed)
Need refill on baclofen (LIORESAL) 10 MG tablet   glucose blood (ONETOUCH VERIO) test strip ;pt contact [phone]   [facility] - [place], [place]

## 2020-10-15 NOTE — Telephone Encounter (Signed)
Received fax from [facility] requesting refills on nystatin powder and hydroxyzine. Hydroxyzine is no longer on current med list. [name], BSN, RN-BC

## 2020-10-15 NOTE — Telephone Encounter (Signed)
RTC to patient.  She is requesting refills on the following medications:  Baclofen Verio Test Strips HCTZ Myrbertriq Pregabalin Trazadone [name] Ozempic   The last baclofen RX [facility], patient states she doesn't go there anymore. Ozempic has fallen off med list, patient states she takes this every Monday. Verio Test strips RX (last rx did not go through) Thanks, [employer], RN,BSN

## 2020-10-15 NOTE — Telephone Encounter (Signed)
Pls refill all medicine, per pt and pharmacy

## 2020-10-17 MED ORDER — NYSTATIN 100000 UNIT/GM EX POWD
1.0000 | Freq: Three times a day (TID) | CUTANEOUS | 2 refills | Status: DC
Start: 2020-10-17 — End: 2020-12-05

## 2020-10-18 ENCOUNTER — Telehealth: Payer: Self-pay | Admitting: Dietician

## 2020-10-18 NOTE — Telephone Encounter (Signed)
Supportive call to schedule an appointment with me and her doctor. She is interested in pursuing bariatric surgery.appointment scheduled with me for 11.4/21.  Sent front office a note to assist her with scheduling with her doctor.

## 2020-10-22 ENCOUNTER — Other Ambulatory Visit: Payer: Self-pay

## 2020-10-22 ENCOUNTER — Telehealth: Payer: Self-pay | Admitting: Dietician

## 2020-10-22 ENCOUNTER — Ambulatory Visit: Payer: Medicare Other | Admitting: Orthotics

## 2020-10-22 DIAGNOSIS — E0843 Diabetes mellitus due to underlying condition with diabetic autonomic (poly)neuropathy: Secondary | ICD-10-CM

## 2020-10-22 DIAGNOSIS — Z89512 Acquired absence of left leg below knee: Secondary | ICD-10-CM

## 2020-10-22 DIAGNOSIS — L84 Corns and callosities: Secondary | ICD-10-CM

## 2020-10-22 NOTE — Telephone Encounter (Signed)
Attempted to contact patient via telephone no answer.  Appointment scheduled with Dr. Jimmye Norman for this Thursday 10/25/2020 at 9:45 am.  Information left on patient's voicemail.

## 2020-10-22 NOTE — Telephone Encounter (Signed)
[name] calls about paperwork for diabetes shoes for [name]. He needs to her see Dr. Jimmye Norman as soon as possible. In the meantime, he will be sending paperwork to [place] for her to sign off to get the process going so he can get her shoes before the end of the year. He asks that she please sign and return them.

## 2020-10-22 NOTE — Progress Notes (Signed)
Patient was measured for med necessity extra depth shoes (x2) w/ 3 pair (x6) OTS ff/o. Patient was measured w/ brannock to determine size and width.  F  See DPM chart notes for further documentation and dx codes for determination of medical necessity.  Appropriate forms will be sent to PCP to verify and sign off on medical necessity.   Patient given OTS due to non-ambulatory and she needs plenty of room to offset swelling in RT foot.

## 2020-10-22 NOTE — Telephone Encounter (Signed)
I have availability in my clinic THursday morning if that is soon enough.

## 2020-10-24 ENCOUNTER — Ambulatory Visit (INDEPENDENT_AMBULATORY_CARE_PROVIDER_SITE_OTHER): Payer: Medicare Other | Admitting: Dietician

## 2020-10-24 ENCOUNTER — Encounter: Payer: Self-pay | Admitting: Dietician

## 2020-10-24 DIAGNOSIS — E1165 Type 2 diabetes mellitus with hyperglycemia: Secondary | ICD-10-CM

## 2020-10-24 DIAGNOSIS — IMO0002 Reserved for concepts with insufficient information to code with codable children: Secondary | ICD-10-CM

## 2020-10-24 DIAGNOSIS — E1151 Type 2 diabetes mellitus with diabetic peripheral angiopathy without gangrene: Secondary | ICD-10-CM

## 2020-10-24 LAB — GLUCOSE, CAPILLARY
Glucose-Capillary: 303 mg/dL — ABNORMAL HIGH (ref 70–99)
Glucose-Capillary: 325 mg/dL — ABNORMAL HIGH (ref 70–99)

## 2020-10-24 LAB — POCT GLYCOSYLATED HEMOGLOBIN (HGB A1C): Hemoglobin A1C: 7.1 % — AB (ref 4.0–5.6)

## 2020-10-24 NOTE — Progress Notes (Signed)
  Diabetes Self-Management Education  Visit Type: Follow-up  Appt. Start Time: 1330 Appt. End Time: 1700  10/24/2020  [name], identified by name and date of birth, is a 58 y.o. female with a diagnosis of Diabetes:  [place] Kitchen Type 2 diabetes  ASSESSMENT Bariatric surgery- assisted patient in signing up for the bariatric webinar per her request DM meds- 70 units [place] which she says she takes every day, 0.5 mg Ozempic every Monday ;Correction Humalog-2x/day, does not check sugar 35 units both times, if 200s, then 26 units Tries to check blood sguar 3 times a day, does not have her meter, would like a Continuous glucose monitor. I suggest a professional CGM and order a personal one as soon as possible.   Hypoglycemia- had an episode this past Monday- took 35 units Humalog about 30 minutes after eating and then it dropped to 39  States she has enough insulin and supplies- has enough of everything  CBG (last 3)  Recent Labs    10/24/20 1354 10/24/20 1423  GLUCAP 303* 325*   Lab Results  Component Value Date   HGBA1C 7.1 (A) 10/24/2020   HGBA1C 8.5 (A) 04/12/2020   HGBA1C 9.3 (H) 11/11/2019   HGBA1C 8.9 (A) 08/18/2019   HGBA1C 9.8 (A) 05/18/2019       Diabetes Self-Management Education - 10/24/20 1600      Visit Information   Visit Type Follow-up      Complications   Number of hypoglycemic episodes per month 1   reports cbg of 39 this week     Subsequent Visit   Since your last visit have you continued or begun to take your medications as prescribed? Yes   tkaing tresiba,semaglutide,Humalog 1/3 times a day          Individualized Plan for Diabetes Self-Management Training:   Learning Objective:  Patient will have a greater understanding of diabetes self-management. Patient education plan is to attend individual and/or group sessions per assessed needs and concerns.   Plan:   There are no Patient Instructions on file for this visit.  Expected Outcomes:      Education material provided: Diabetes Resources  If problems or questions, patient to contact team via:  Phone  Future DSME appointment:    [name], RD 10/24/2020 4:50 PM.

## 2020-10-25 ENCOUNTER — Other Ambulatory Visit: Payer: Self-pay

## 2020-10-25 ENCOUNTER — Ambulatory Visit (INDEPENDENT_AMBULATORY_CARE_PROVIDER_SITE_OTHER): Payer: Medicare Other | Admitting: Internal Medicine

## 2020-10-25 ENCOUNTER — Encounter: Payer: Self-pay | Admitting: Internal Medicine

## 2020-10-25 ENCOUNTER — Telehealth: Payer: Medicare Other

## 2020-10-25 VITALS — BP 155/66 | HR 68 | Temp 98.2°F | Ht 67.0 in | Wt 261.3 lb

## 2020-10-25 DIAGNOSIS — F1120 Opioid dependence, uncomplicated: Secondary | ICD-10-CM

## 2020-10-25 DIAGNOSIS — R296 Repeated falls: Secondary | ICD-10-CM

## 2020-10-25 DIAGNOSIS — R269 Unspecified abnormalities of gait and mobility: Secondary | ICD-10-CM

## 2020-10-25 DIAGNOSIS — F5104 Psychophysiologic insomnia: Secondary | ICD-10-CM | POA: Diagnosis not present

## 2020-10-25 DIAGNOSIS — J449 Chronic obstructive pulmonary disease, unspecified: Secondary | ICD-10-CM

## 2020-10-25 DIAGNOSIS — F332 Major depressive disorder, recurrent severe without psychotic features: Secondary | ICD-10-CM

## 2020-10-25 DIAGNOSIS — N183 Chronic kidney disease, stage 3 unspecified: Secondary | ICD-10-CM

## 2020-10-25 DIAGNOSIS — E785 Hyperlipidemia, unspecified: Secondary | ICD-10-CM | POA: Diagnosis not present

## 2020-10-25 DIAGNOSIS — Z23 Encounter for immunization: Secondary | ICD-10-CM

## 2020-10-25 DIAGNOSIS — E1139 Type 2 diabetes mellitus with other diabetic ophthalmic complication: Secondary | ICD-10-CM

## 2020-10-25 DIAGNOSIS — G4734 Idiopathic sleep related nonobstructive alveolar hypoventilation: Secondary | ICD-10-CM

## 2020-10-25 DIAGNOSIS — E1151 Type 2 diabetes mellitus with diabetic peripheral angiopathy without gangrene: Secondary | ICD-10-CM | POA: Diagnosis not present

## 2020-10-25 DIAGNOSIS — I5032 Chronic diastolic (congestive) heart failure: Secondary | ICD-10-CM

## 2020-10-25 DIAGNOSIS — N3946 Mixed incontinence: Secondary | ICD-10-CM

## 2020-10-25 DIAGNOSIS — J42 Unspecified chronic bronchitis: Secondary | ICD-10-CM

## 2020-10-25 DIAGNOSIS — Z794 Long term (current) use of insulin: Secondary | ICD-10-CM

## 2020-10-25 DIAGNOSIS — Z9889 Other specified postprocedural states: Secondary | ICD-10-CM

## 2020-10-25 DIAGNOSIS — K635 Polyp of colon: Secondary | ICD-10-CM

## 2020-10-25 DIAGNOSIS — E1159 Type 2 diabetes mellitus with other circulatory complications: Secondary | ICD-10-CM | POA: Diagnosis not present

## 2020-10-25 DIAGNOSIS — I152 Hypertension secondary to endocrine disorders: Secondary | ICD-10-CM

## 2020-10-25 DIAGNOSIS — I739 Peripheral vascular disease, unspecified: Secondary | ICD-10-CM

## 2020-10-25 DIAGNOSIS — E1142 Type 2 diabetes mellitus with diabetic polyneuropathy: Secondary | ICD-10-CM

## 2020-10-25 DIAGNOSIS — E1169 Type 2 diabetes mellitus with other specified complication: Secondary | ICD-10-CM

## 2020-10-25 DIAGNOSIS — Z8739 Personal history of other diseases of the musculoskeletal system and connective tissue: Secondary | ICD-10-CM

## 2020-10-25 DIAGNOSIS — E1122 Type 2 diabetes mellitus with diabetic chronic kidney disease: Secondary | ICD-10-CM

## 2020-10-25 DIAGNOSIS — E118 Type 2 diabetes mellitus with unspecified complications: Secondary | ICD-10-CM

## 2020-10-25 LAB — MICROALBUMIN / CREATININE URINE RATIO
Creatinine, Urine: 49.3 mg/dL
Microalb/Creat Ratio: 142 mg/g creat — ABNORMAL HIGH (ref 0–29)
Microalbumin, Urine: 70 ug/mL

## 2020-10-25 MED ORDER — ROSUVASTATIN CALCIUM 20 MG PO TABS: 20.0000 mg | ORAL_TABLET | Freq: Every day | ORAL | 3 refills | Status: DC

## 2020-10-25 MED ORDER — ANORO ELLIPTA 62.5-25 MCG/INH IN AEPB: 1.0000 | INHALATION_SPRAY | Freq: Every day | RESPIRATORY_TRACT | 3 refills | Status: DC
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
## 2020-10-25 NOTE — Patient Instructions (Addendum)
Jennifer [name],  It was a pleasure to meet you today!  We discussed many of your ongoing conditions, though today we focused on your blood pressure, diabetes, and cholesterol, as well as your circulation.  I want to schedule you for an hour appointment to address your tremor, falling, memory problems, and hallucinations.  This will take extra time.  We discussed that you need authorization for diabetic shoes and for a motorized WC or scooter.  I will complete paperwork as required.  Let's get you back to physical therapy as well!    Today we checked blood work and I will mail you the results, and call if there is anything that needs attention before our next appointment.  Take care and move carefully - we don't want you to have an injury from a fall!  Dr. Jimmye Norman

## 2020-10-26 ENCOUNTER — Encounter: Payer: Self-pay | Admitting: Internal Medicine

## 2020-10-26 LAB — LIPID PANEL
Chol/HDL Ratio: 2.4 ratio (ref 0.0–4.4)
Cholesterol, Total: 111 mg/dL (ref 100–199)
HDL: 47 mg/dL (ref >39)
LDL Chol Calc (NIH): 47 mg/dL (ref 0–99)
Triglycerides: 87 mg/dL (ref 0–149)
VLDL Cholesterol Cal: 17 mg/dL (ref 5–40)

## 2020-10-26 LAB — SEDIMENTATION RATE: Sed Rate: 29 mm/hr (ref 0–40)

## 2020-10-26 LAB — BMP8+ANION GAP
Anion Gap: 15 mmol/L (ref 10.0–18.0)
BUN/Creatinine Ratio: 17 (ref 12–28)
BUN: 21 mg/dL (ref 8–27)
CO2: 23 mmol/L (ref 20–29)
Calcium: 9.1 mg/dL (ref 8.7–10.3)
Chloride: 105 mmol/L (ref 96–106)
Creatinine, Ser: 1.24 mg/dL — ABNORMAL HIGH (ref 0.57–1.00)
GFR calc Af Amer: 54 mL/min/{1.73_m2} — ABNORMAL LOW (ref >59)
GFR calc non Af Amer: 47 mL/min/{1.73_m2} — ABNORMAL LOW (ref >59)
Glucose: 250 mg/dL — ABNORMAL HIGH (ref 65–99)
Potassium: 5.1 mmol/L (ref 3.5–5.2)
Sodium: 143 mmol/L (ref 134–144)

## 2020-10-26 LAB — VITAMIN D 25 HYDROXY (VIT D DEFICIENCY, FRACTURES): Vit D, 25-Hydroxy: 29.9 ng/mL — ABNORMAL LOW (ref 30.0–100.0)

## 2020-10-26 LAB — C-REACTIVE PROTEIN: CRP: 34 mg/L — ABNORMAL HIGH (ref 0–10)

## 2020-10-26 MED ORDER — ASPIRIN EC 81 MG PO TBEC: 81.0000 mg | DELAYED_RELEASE_TABLET | Freq: Every day | ORAL | Status: DC

## 2020-10-26 MED ORDER — AMLODIPINE BESYLATE 10 MG PO TABS: 10.0000 mg | ORAL_TABLET | Freq: Every day | ORAL | 3 refills | Status: DC

## 2020-10-26 NOTE — Assessment & Plan Note (Signed)
Pharmacotherapy and currently posterior tibial nerve stimulation have been ineffective.  Significant negative impact on QOL.   I am considering role of a home PureWick system for nighttime use.

## 2020-10-26 NOTE — Assessment & Plan Note (Signed)
Not taking HCTZ due to urinary frequency, and she had recently run out of amlodipine.  Amlodipine renewed.

## 2020-10-26 NOTE — Assessment & Plan Note (Signed)
BMP today

## 2020-10-26 NOTE — Assessment & Plan Note (Signed)
At this time, most significant cause is 4-5x nocturia.  I am considering role of a home PureWick system.
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
## 2020-10-26 NOTE — Assessment & Plan Note (Signed)
No current ischemic skin changes of RLE.  New finding of reduced R radial pulse and reduced R brachial BP.  Will need to take BP only in L arm.  Continue risk factor management and antiplatelet.

## 2020-10-26 NOTE — Assessment & Plan Note (Signed)
She has not found an eye specialist who takes her insurance; working on this.

## 2020-10-26 NOTE — Assessment & Plan Note (Signed)
RLE edema significant due to self-discontination of diuretic due to very frequent urinary incontinence and nocturia.  Discussed compression (she is unable to don/doff stocking), elevation, and resuming diuretic.  UI has not responded to pharmacotherapy or to sacral nerve stimulation thus far.  I am considering a home PureWick system for nighttime though have not discussed this with her yet.  No pulmonary signs/symptoms of heart failure.
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
## 2020-10-26 NOTE — Assessment & Plan Note (Signed)
Due for lipid panell - ordered today

## 2020-10-26 NOTE — Assessment & Plan Note (Signed)
Significant impairment due to LLE amputation; 30-40 falls in past year.  Looking into acquiring a motorized WC, will need a PT eval.  Will place referral.

## 2020-10-26 NOTE — Assessment & Plan Note (Signed)
A1c is 7.1, very good in her case.  No change in regimen.  SHe is working with RD.

## 2020-10-28 ENCOUNTER — Emergency Department (HOSPITAL_COMMUNITY): Payer: Medicare Other

## 2020-10-28 ENCOUNTER — Other Ambulatory Visit: Payer: Self-pay

## 2020-10-28 ENCOUNTER — Inpatient Hospital Stay (HOSPITAL_COMMUNITY)
Admission: EM | Admit: 2020-10-28 | Discharge: 2020-10-31 | DRG: 690 | Disposition: A | Discharge status: Home Health | Payer: Medicare Other | Attending: Internal Medicine | Admitting: Internal Medicine

## 2020-10-28 ENCOUNTER — Encounter (HOSPITAL_COMMUNITY): Payer: Self-pay

## 2020-10-28 DIAGNOSIS — Z9851 Tubal ligation status: Secondary | ICD-10-CM

## 2020-10-28 DIAGNOSIS — E1169 Type 2 diabetes mellitus with other specified complication: Secondary | ICD-10-CM | POA: Diagnosis present

## 2020-10-28 DIAGNOSIS — Z833 Family history of diabetes mellitus: Secondary | ICD-10-CM

## 2020-10-28 DIAGNOSIS — Z91041 Radiographic dye allergy status: Secondary | ICD-10-CM

## 2020-10-28 DIAGNOSIS — Z8249 Family history of ischemic heart disease and other diseases of the circulatory system: Secondary | ICD-10-CM

## 2020-10-28 DIAGNOSIS — R197 Diarrhea, unspecified: Secondary | ICD-10-CM | POA: Diagnosis present

## 2020-10-28 DIAGNOSIS — K760 Fatty (change of) liver, not elsewhere classified: Secondary | ICD-10-CM | POA: Diagnosis present

## 2020-10-28 DIAGNOSIS — E118 Type 2 diabetes mellitus with unspecified complications: Secondary | ICD-10-CM | POA: Diagnosis present

## 2020-10-28 DIAGNOSIS — G894 Chronic pain syndrome: Secondary | ICD-10-CM | POA: Diagnosis present

## 2020-10-28 DIAGNOSIS — E669 Obesity, unspecified: Secondary | ICD-10-CM | POA: Diagnosis not present

## 2020-10-28 DIAGNOSIS — E785 Hyperlipidemia, unspecified: Secondary | ICD-10-CM | POA: Diagnosis present

## 2020-10-28 DIAGNOSIS — F17210 Nicotine dependence, cigarettes, uncomplicated: Secondary | ICD-10-CM | POA: Diagnosis present

## 2020-10-28 DIAGNOSIS — Z6841 Body Mass Index (BMI) 40.0 and over, adult: Secondary | ICD-10-CM | POA: Diagnosis not present

## 2020-10-28 DIAGNOSIS — R296 Repeated falls: Secondary | ICD-10-CM | POA: Diagnosis present

## 2020-10-28 DIAGNOSIS — Z20822 Contact with and (suspected) exposure to covid-19: Secondary | ICD-10-CM | POA: Diagnosis present

## 2020-10-28 DIAGNOSIS — I130 Hypertensive heart and chronic kidney disease with heart failure and stage 1 through stage 4 chronic kidney disease, or unspecified chronic kidney disease: Secondary | ICD-10-CM | POA: Diagnosis present

## 2020-10-28 DIAGNOSIS — Z7982 Long term (current) use of aspirin: Secondary | ICD-10-CM

## 2020-10-28 DIAGNOSIS — Z7984 Long term (current) use of oral hypoglycemic drugs: Secondary | ICD-10-CM

## 2020-10-28 DIAGNOSIS — N3000 Acute cystitis without hematuria: Secondary | ICD-10-CM | POA: Diagnosis present

## 2020-10-28 DIAGNOSIS — I152 Hypertension secondary to endocrine disorders: Secondary | ICD-10-CM | POA: Diagnosis present

## 2020-10-28 DIAGNOSIS — Z89512 Acquired absence of left leg below knee: Secondary | ICD-10-CM

## 2020-10-28 DIAGNOSIS — H409 Unspecified glaucoma: Secondary | ICD-10-CM | POA: Diagnosis present

## 2020-10-28 DIAGNOSIS — J449 Chronic obstructive pulmonary disease, unspecified: Secondary | ICD-10-CM | POA: Diagnosis present

## 2020-10-28 DIAGNOSIS — R6510 Systemic inflammatory response syndrome (SIRS) of non-infectious origin without acute organ dysfunction: Secondary | ICD-10-CM | POA: Diagnosis present

## 2020-10-28 DIAGNOSIS — E119 Type 2 diabetes mellitus without complications: Secondary | ICD-10-CM | POA: Diagnosis present

## 2020-10-28 DIAGNOSIS — W19XXXA Unspecified fall, initial encounter: Secondary | ICD-10-CM | POA: Diagnosis present

## 2020-10-28 DIAGNOSIS — N390 Urinary tract infection, site not specified: Secondary | ICD-10-CM | POA: Diagnosis present

## 2020-10-28 DIAGNOSIS — R443 Hallucinations, unspecified: Secondary | ICD-10-CM | POA: Diagnosis present

## 2020-10-28 DIAGNOSIS — N183 Chronic kidney disease, stage 3 unspecified: Secondary | ICD-10-CM | POA: Diagnosis present

## 2020-10-28 DIAGNOSIS — R351 Nocturia: Secondary | ICD-10-CM | POA: Diagnosis present

## 2020-10-28 DIAGNOSIS — Z794 Long term (current) use of insulin: Secondary | ICD-10-CM

## 2020-10-28 DIAGNOSIS — R0902 Hypoxemia: Secondary | ICD-10-CM | POA: Diagnosis present

## 2020-10-28 DIAGNOSIS — Z885 Allergy status to narcotic agent status: Secondary | ICD-10-CM

## 2020-10-28 DIAGNOSIS — R531 Weakness: Secondary | ICD-10-CM | POA: Diagnosis not present

## 2020-10-28 DIAGNOSIS — E1122 Type 2 diabetes mellitus with diabetic chronic kidney disease: Secondary | ICD-10-CM | POA: Diagnosis present

## 2020-10-28 DIAGNOSIS — Z888 Allergy status to other drugs, medicaments and biological substances status: Secondary | ICD-10-CM

## 2020-10-28 DIAGNOSIS — Z86718 Personal history of other venous thrombosis and embolism: Secondary | ICD-10-CM

## 2020-10-28 DIAGNOSIS — N179 Acute kidney failure, unspecified: Secondary | ICD-10-CM | POA: Diagnosis present

## 2020-10-28 DIAGNOSIS — M797 Fibromyalgia: Secondary | ICD-10-CM | POA: Diagnosis present

## 2020-10-28 DIAGNOSIS — Z825 Family history of asthma and other chronic lower respiratory diseases: Secondary | ICD-10-CM

## 2020-10-28 DIAGNOSIS — K219 Gastro-esophageal reflux disease without esophagitis: Secondary | ICD-10-CM | POA: Diagnosis present

## 2020-10-28 DIAGNOSIS — E860 Dehydration: Secondary | ICD-10-CM | POA: Diagnosis present

## 2020-10-28 DIAGNOSIS — E1165 Type 2 diabetes mellitus with hyperglycemia: Secondary | ICD-10-CM | POA: Diagnosis present

## 2020-10-28 DIAGNOSIS — N12 Tubulo-interstitial nephritis, not specified as acute or chronic: Secondary | ICD-10-CM | POA: Diagnosis present

## 2020-10-28 DIAGNOSIS — S0990XA Unspecified injury of head, initial encounter: Secondary | ICD-10-CM | POA: Diagnosis present

## 2020-10-28 DIAGNOSIS — E11621 Type 2 diabetes mellitus with foot ulcer: Secondary | ICD-10-CM | POA: Diagnosis present

## 2020-10-28 DIAGNOSIS — Z79899 Other long term (current) drug therapy: Secondary | ICD-10-CM

## 2020-10-28 DIAGNOSIS — I10 Essential (primary) hypertension: Secondary | ICD-10-CM | POA: Diagnosis present

## 2020-10-28 DIAGNOSIS — I5032 Chronic diastolic (congestive) heart failure: Secondary | ICD-10-CM | POA: Diagnosis present

## 2020-10-28 DIAGNOSIS — Z89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): Secondary | ICD-10-CM

## 2020-10-28 DIAGNOSIS — F1120 Opioid dependence, uncomplicated: Secondary | ICD-10-CM | POA: Diagnosis present

## 2020-10-28 DIAGNOSIS — N182 Chronic kidney disease, stage 2 (mild): Secondary | ICD-10-CM | POA: Diagnosis present

## 2020-10-28 DIAGNOSIS — Z86010 Personal history of colonic polyps: Secondary | ICD-10-CM

## 2020-10-28 DIAGNOSIS — E1159 Type 2 diabetes mellitus with other circulatory complications: Secondary | ICD-10-CM | POA: Diagnosis present

## 2020-10-28 HISTORY — DX: Tubulo-interstitial nephritis, not specified as acute or chronic: N12

## 2020-10-28 LAB — RESPIRATORY PANEL BY RT PCR (FLU A&B, COVID)
Influenza A by PCR: NEGATIVE
Influenza B by PCR: NEGATIVE
SARS Coronavirus 2 by RT PCR: NEGATIVE

## 2020-10-28 LAB — CBC WITH DIFFERENTIAL/PLATELET
Abs Immature Granulocytes: 0.05 10*3/uL (ref 0.00–0.07)
Basophils Absolute: 0 10*3/uL (ref 0.0–0.1)
Basophils Relative: 0 %
Eosinophils Absolute: 0 10*3/uL (ref 0.0–0.5)
Eosinophils Relative: 0 %
HCT: 37.7 % (ref 36.0–46.0)
Hemoglobin: 12.2 g/dL (ref 12.0–15.0)
Immature Granulocytes: 0 %
Lymphocytes Relative: 16 %
Lymphs Abs: 2.2 10*3/uL (ref 0.7–4.0)
MCH: 30.7 pg (ref 26.0–34.0)
MCHC: 32.4 g/dL (ref 30.0–36.0)
MCV: 94.7 fL (ref 80.0–100.0)
Monocytes Absolute: 1.2 10*3/uL — ABNORMAL HIGH (ref 0.1–1.0)
Monocytes Relative: 9 %
Neutro Abs: 10.2 10*3/uL — ABNORMAL HIGH (ref 1.7–7.7)
Neutrophils Relative %: 75 %
Platelets: 115 10*3/uL — ABNORMAL LOW (ref 150–400)
RBC: 3.98 MIL/uL (ref 3.87–5.11)
RDW: 13.8 % (ref 11.5–15.5)
WBC: 13.7 10*3/uL — ABNORMAL HIGH (ref 4.0–10.5)
nRBC: 0 % (ref 0.0–0.2)

## 2020-10-28 LAB — CBC
HCT: 37.1 % (ref 36.0–46.0)
Hemoglobin: 12.3 g/dL (ref 12.0–15.0)
MCH: 31.9 pg (ref 26.0–34.0)
MCHC: 33.2 g/dL (ref 30.0–36.0)
MCV: 96.4 fL (ref 80.0–100.0)
Platelets: 115 10*3/uL — ABNORMAL LOW (ref 150–400)
RBC: 3.85 MIL/uL — ABNORMAL LOW (ref 3.87–5.11)
RDW: 14 % (ref 11.5–15.5)
WBC: 11.4 10*3/uL — ABNORMAL HIGH (ref 4.0–10.5)
nRBC: 0 % (ref 0.0–0.2)

## 2020-10-28 LAB — URINALYSIS, ROUTINE W REFLEX MICROSCOPIC
Bilirubin Urine: NEGATIVE
Glucose, UA: >=500 mg/dL — AB
Ketones, ur: NEGATIVE mg/dL
Nitrite: NEGATIVE
Protein, ur: 100 mg/dL — AB
Specific Gravity, Urine: 1.011 (ref 1.005–1.030)
WBC, UA: >50 WBC/hpf — ABNORMAL HIGH (ref 0–5)
pH: 5 (ref 5.0–8.0)

## 2020-10-28 LAB — RAPID URINE DRUG SCREEN, HOSP PERFORMED
Amphetamines: NOT DETECTED
Barbiturates: NOT DETECTED
Benzodiazepines: NOT DETECTED
Cocaine: NOT DETECTED
Opiates: NOT DETECTED
Tetrahydrocannabinol: NOT DETECTED

## 2020-10-28 LAB — COMPREHENSIVE METABOLIC PANEL
ALT: 19 U/L (ref 0–44)
AST: 22 U/L (ref 15–41)
Albumin: 3 g/dL — ABNORMAL LOW (ref 3.5–5.0)
Alkaline Phosphatase: 93 U/L (ref 38–126)
Anion gap: 10 (ref 5–15)
BUN: 18 mg/dL (ref 8–23)
CO2: 24 mmol/L (ref 22–32)
Calcium: 9.1 mg/dL (ref 8.9–10.3)
Chloride: 103 mmol/L (ref 98–111)
Creatinine, Ser: 1.34 mg/dL — ABNORMAL HIGH (ref 0.44–1.00)
GFR, Estimated: 45 mL/min — ABNORMAL LOW (ref >60)
Glucose, Bld: 241 mg/dL — ABNORMAL HIGH (ref 70–99)
Potassium: 4.5 mmol/L (ref 3.5–5.1)
Sodium: 137 mmol/L (ref 135–145)
Total Bilirubin: 0.6 mg/dL (ref 0.3–1.2)
Total Protein: 7.1 g/dL (ref 6.5–8.1)

## 2020-10-28 LAB — CREATININE, SERUM
Creatinine, Ser: 1.23 mg/dL — ABNORMAL HIGH (ref 0.44–1.00)
GFR, Estimated: 50 mL/min — ABNORMAL LOW (ref >60)

## 2020-10-28 LAB — TROPONIN I (HIGH SENSITIVITY): Troponin I (High Sensitivity): 10 ng/L (ref <18)

## 2020-10-28 LAB — MAGNESIUM: Magnesium: 2 mg/dL (ref 1.7–2.4)

## 2020-10-28 LAB — HIV ANTIBODY (ROUTINE TESTING W REFLEX): HIV Screen 4th Generation wRfx: NONREACTIVE

## 2020-10-28 LAB — LACTIC ACID, PLASMA: Lactic Acid, Venous: 1.7 mmol/L (ref 0.5–1.9)

## 2020-10-28 LAB — T4, FREE: Free T4: 1.12 ng/dL (ref 0.61–1.12)

## 2020-10-28 LAB — BRAIN NATRIURETIC PEPTIDE: B Natriuretic Peptide: 173.3 pg/mL — ABNORMAL HIGH (ref 0.0–100.0)

## 2020-10-28 LAB — CBG MONITORING, ED
Glucose-Capillary: 250 mg/dL — ABNORMAL HIGH (ref 70–99)
Glucose-Capillary: 174 mg/dL — ABNORMAL HIGH (ref 70–99)

## 2020-10-28 LAB — ETHANOL: Alcohol, Ethyl (B): <10 mg/dL (ref <10)

## 2020-10-28 LAB — TSH: TSH: 0.408 u[IU]/mL (ref 0.350–4.500)

## 2020-10-28 MED ORDER — BENAZEPRIL HCL 40 MG PO TABS
40.0000 mg | ORAL_TABLET | Freq: Every day | ORAL | Status: DC
  Administered 2020-10-29 – 2020-10-31 (×3): 40 mg via ORAL
  Filled 2020-10-28 (×4): qty 1

## 2020-10-28 MED ORDER — OXYCODONE-ACETAMINOPHEN 5-325 MG PO TABS
1.0000 | ORAL_TABLET | Freq: Four times a day (QID) | ORAL | Status: DC | PRN
  Administered 2020-10-28: 1 via ORAL
  Filled 2020-10-28: qty 1

## 2020-10-28 MED ORDER — SODIUM CHLORIDE 0.9 % IV SOLN: 1.0000 g | INTRAVENOUS | Status: DC

## 2020-10-28 MED ORDER — ACETAMINOPHEN 650 MG RE SUPP: 650.0000 mg | Freq: Four times a day (QID) | RECTAL | Status: DC | PRN

## 2020-10-28 MED ORDER — BUPROPION HCL ER (XL) 150 MG PO TB24
300.0000 mg | ORAL_TABLET | Freq: Every day | ORAL | Status: DC
  Administered 2020-10-29 – 2020-10-31 (×3): 300 mg via ORAL
  Filled 2020-10-28 (×3): qty 2

## 2020-10-28 MED ORDER — OXYBUTYNIN CHLORIDE ER 5 MG PO TB24
5.0000 mg | ORAL_TABLET | Freq: Every day | ORAL | Status: DC
  Filled 2020-10-28: qty 1

## 2020-10-28 MED ORDER — PREGABALIN 100 MG PO CAPS
100.0000 mg | ORAL_CAPSULE | Freq: Three times a day (TID) | ORAL | Status: DC
  Administered 2020-10-28 – 2020-10-31 (×9): 100 mg via ORAL
  Filled 2020-10-28 (×3): qty 1
  Filled 2020-10-28: qty 2
  Filled 2020-10-28 (×5): qty 1

## 2020-10-28 MED ORDER — SENNA 8.6 MG PO TABS
1.0000 | ORAL_TABLET | Freq: Two times a day (BID) | ORAL | Status: DC
  Administered 2020-10-29: 8.6 mg via ORAL
  Filled 2020-10-28 (×5): qty 1

## 2020-10-28 MED ORDER — SODIUM CHLORIDE 0.9 % IV BOLUS
1000.0000 mL | Freq: Once | INTRAVENOUS | Status: AC
  Administered 2020-10-28: 1000 mL via INTRAVENOUS

## 2020-10-28 MED ORDER — SODIUM CHLORIDE 0.9 % IV SOLN
1.0000 g | Freq: Once | INTRAVENOUS | Status: AC
  Administered 2020-10-28: 1 g via INTRAVENOUS
  Filled 2020-10-28: qty 10

## 2020-10-28 MED ORDER — INSULIN ASPART 100 UNIT/ML ~~LOC~~ SOLN
0.0000 [IU] | Freq: Three times a day (TID) | SUBCUTANEOUS | Status: DC
  Administered 2020-10-28 – 2020-10-29 (×2): 4 [IU] via SUBCUTANEOUS
  Administered 2020-10-29 (×2): 7 [IU] via SUBCUTANEOUS
  Administered 2020-10-30: 11 [IU] via SUBCUTANEOUS
  Administered 2020-10-30: 4 [IU] via SUBCUTANEOUS
  Administered 2020-10-30: 7 [IU] via SUBCUTANEOUS
  Administered 2020-10-31: 5 [IU] via SUBCUTANEOUS
  Administered 2020-10-31: 7 [IU] via SUBCUTANEOUS
  Administered 2020-10-31: 15 [IU] via SUBCUTANEOUS

## 2020-10-28 MED ORDER — ACETAMINOPHEN 325 MG PO TABS
650.0000 mg | ORAL_TABLET | Freq: Four times a day (QID) | ORAL | Status: DC | PRN
  Administered 2020-10-29 – 2020-10-31 (×3): 650 mg via ORAL
  Filled 2020-10-28 (×3): qty 2

## 2020-10-28 MED ORDER — UMECLIDINIUM-VILANTEROL 62.5-25 MCG/INH IN AEPB
1.0000 | INHALATION_SPRAY | Freq: Every day | RESPIRATORY_TRACT | Status: DC
  Administered 2020-10-29 – 2020-10-31 (×3): 1 via RESPIRATORY_TRACT
  Filled 2020-10-28 (×2): qty 14

## 2020-10-28 MED ORDER — ENOXAPARIN SODIUM 60 MG/0.6ML ~~LOC~~ SOLN
55.0000 mg | SUBCUTANEOUS | Status: DC
  Administered 2020-10-28 – 2020-10-31 (×4): 55 mg via SUBCUTANEOUS
  Filled 2020-10-28: qty 0.55
  Filled 2020-10-28 (×2): qty 0.6
  Filled 2020-10-28: qty 0.55

## 2020-10-28 MED ORDER — HYDROXYZINE HCL 25 MG PO TABS: 50.0000 mg | ORAL_TABLET | Freq: Every day | ORAL | Status: DC

## 2020-10-28 MED ORDER — INSULIN GLARGINE 100 UNIT/ML ~~LOC~~ SOLN
70.0000 [IU] | Freq: Every day | SUBCUTANEOUS | Status: DC
  Administered 2020-10-29: 70 [IU] via SUBCUTANEOUS
  Filled 2020-10-28: qty 0.7

## 2020-10-28 MED ORDER — ALBUTEROL SULFATE HFA 108 (90 BASE) MCG/ACT IN AERS
1.0000 | INHALATION_SPRAY | RESPIRATORY_TRACT | Status: DC | PRN
  Administered 2020-10-31: 1 via RESPIRATORY_TRACT
  Filled 2020-10-28: qty 6.7

## 2020-10-28 MED ORDER — LACTATED RINGERS IV SOLN: INTRAVENOUS | Status: AC

## 2020-10-28 MED ORDER — ROSUVASTATIN CALCIUM 20 MG PO TABS
20.0000 mg | ORAL_TABLET | Freq: Every day | ORAL | Status: DC
  Administered 2020-10-29 – 2020-10-31 (×3): 20 mg via ORAL
  Filled 2020-10-28: qty 1
  Filled 2020-10-28: qty 4
  Filled 2020-10-28: qty 1

## 2020-10-28 MED ORDER — AMLODIPINE BESYLATE 10 MG PO TABS
10.0000 mg | ORAL_TABLET | Freq: Every day | ORAL | Status: DC
  Administered 2020-10-29 – 2020-10-31 (×3): 10 mg via ORAL
  Filled 2020-10-28: qty 1
  Filled 2020-10-28: qty 2
  Filled 2020-10-28: qty 1

## 2020-10-28 MED ORDER — PANTOPRAZOLE SODIUM 40 MG PO TBEC
40.0000 mg | DELAYED_RELEASE_TABLET | Freq: Every day | ORAL | Status: DC
  Administered 2020-10-29 – 2020-10-31 (×3): 40 mg via ORAL
  Filled 2020-10-28 (×3): qty 1
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
## 2020-10-28 NOTE — H&P (Addendum)
Date: 10/28/2020               Patient Name:  Jennifer [name] MRN: [id number]  DOB: Mar 09, 1961 Age / Sex: 59 y.o., female   PCP: Angelica Pou, MD         Medical Service: Internal Medicine Teaching Service         Attending Physician: Dr. Aldine Contes, MD    First Contact: Dr. Alfonse Spruce Pager: [phone]  Second Contact: Dr. Darrick Meigs Pager: [phone]       After Hours (After 5p/  First Contact Pager: [phone]  weekends / holidays): Second Contact Pager: [phone]   Chief Complaint: Generalized weakness  History of Present Illness:  Jennifer [name] is a 59 year old female with past medical history of type II diabetes, hypertension, chronic diastolic heart failure, left BKA, opioids dependence, CKD 3, nocturnal hypoxemia, who presents to the ED after a fall.  Patient states that she has been feeling a little weaker in the last week and has been fallen many times.  She endorses head injuries and pain from other body parts due to trauma.  She denies loss of consciousness.  She is using a rolling wheelchair and a walker at home for ambulation and has been using the bathroom on her own.  She also endorses chest pain, shortness of breath, coughing, abdominal pain, diarrhea, dysuria, frequency, chills, leg swelling.  She states that she has not been eating well in the last few days due to feeling sick.  Patient reportedly taking all her medications.  I called and spoke to her son.  He states that patient has been weaker in the last few weeks and has been falling often.  He states that her strength has progressively worsened and she keeps dropping any objects that she holds in her hands.  He called ambulance today after her fall in the bathroom.  He denied any loss of consciousness.  ED course: CT head and CT abdomen/pelvis were unremarkable.  Chest x-rays negative for acute process.  Patient had a fever of 100.4.  Respiratory rate 21, blood pressure 158/99, pulse 88, white count 13.7,  creatinine 1.34.  Other labs are unremarkable.  Troponin is 10.  UA showed large leukocyte esterase but only a few bacteria.  She was started on ceftriaxone is given 1 L bolus of normal saline.  She will be admitted to the internal medicine teaching service for a UTI, AKI and generalized weakness.   Meds:  No outpatient medications have been marked as taking for the 10/28/20 encounter [facility] Encounter).     Allergies: Allergies as of 10/28/2020 - Review Complete 10/28/2020  Allergen Reaction Noted  . Abilify [aripiprazole] Other (See Comments) 11/09/2015  . Iohexol Other (See Comments) 08/17/2007  . Ivp dye [iodinated diagnostic agents] Other (See Comments) 05/10/2013  . Morphine sulfate Itching and Rash    Past Medical History:  Diagnosis Date  . Cataract   . Chronic bronchitis ([place])    "I get it alot" (09/28/2013)  . Chronic diastolic heart failure (H[facility])    grade 2 per 2D echocardiogram (01/2013)  . Chronic lower back pain   . Chronic pain syndrome 12/03/2011   Likely secondary to depression, "fibromyalgia", neuropathy, and obesity. Lumbar MRI 2014 no sig change from prior (2008) : Stable hypertrophic facet disease most notable at L4-5. Stable shallow left foraminal/extraforaminal disc protrusion at L4-5. No direct neural compression.      Marland Kitchen COPD 01/08/2007   PFT's 05/2007 : FEV1/FVC 82, FEV1  64% pred, FEF 25-75% 40% predicted, 16% improvement in FEV1 with bronchodilators.     . Diabetic peripheral neuropathy ([place])   . DVT of upper extremity (deep vein thrombosis) ([place]) 03/11/2013   Secondary to PICC line. Right brachial vein, diagnosed on 03/10/2013 Coumadin for 3 months. End date 06/10/2013   . Environmental allergies    Hx: of  . Fatty liver 2003   observed on ultrasound abdomen  . Fibromyalgia   . GERD (gastroesophageal reflux disease)   . Glaucoma   . History of bacterial endocarditis 2014   Endocarditis involving mitral and tricuspid valves.  S. Aureus and GBS.   Marland Kitchen  History of use of hearing aid   . Hyperlipidemia   . Hyperplastic colon polyp 12/2010   Per colonoscopy (12/2010) - Dr. Deatra Ina  . Hypertension   . 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 PVD (peripheral vascular disease) with claudication ([place])    Stents to bilateral common iliac arteries (left 2005, right 2008), on chronic plavix  . S/P BKA (below knee amputation) unilateral ([place])    2014 L - failed limb preserving treatment. 2/2 tobacco use, DM, and cont weight bearing on surgical wound and developed gangrene   . Tobacco abuse   . Type II diabetes mellitus with peripheral circulatory disorders, uncontrolled DX: 1993   Insulin dep. Poor control. Complicated by diabetic foot ulcer and diabetic eye disease.      Family History:  Mother: Hypertension, diabetes, CHF Father: Asthma  Social History:  No alcohol Smoked half a pack a day No drug use  Review of Systems: A complete ROS was negative except as per HPI.   Physical Exam: Blood pressure (!) 165/64, pulse 88, temperature 99.5 F (37.5 C), temperature source Oral, resp. rate (!) 23, SpO2 100 %. Physical Exam Constitutional:      General: She is in acute distress.     Appearance: She is obese.  HENT:     Head: Normocephalic.     Mouth/Throat:     Mouth: Mucous membranes are dry.  Eyes:     General:        Right eye: No discharge.        Left eye: No discharge.     Extraocular Movements: Extraocular movements intact.     Conjunctiva/sclera: Conjunctivae normal.     Pupils: Pupils are equal, round, and reactive to light.  Cardiovascular:     Rate and Rhythm: Normal rate and regular rhythm.  Pulmonary:     Effort: No respiratory distress.     Breath sounds: Normal breath sounds.  Abdominal:     General: Bowel sounds are normal.     Palpations: Abdomen is soft.     Tenderness: There is abdominal tenderness (Diffuse tenderness to palpation). There is no  guarding.     Comments: Pain to palpation of right upper quadrant but no guarding  Musculoskeletal:     Comments: Left BKA +1 edema right lower extremity  Skin:    General: Skin is warm.  Neurological:     Mental Status: She is alert.     Comments: PERRLA Cranial nerves no deficit Slightly weaker strength of upper extremities bilaterally, likely lassitude  Psychiatric:        Mood and Affect: Mood normal.     EKG: personally reviewed my interpretation is normal sinus rhythm  CXR: personally reviewed my interpretation is normal  Assessment & Plan by Problem: Principal  Problem:   Generalized weakness Active Problems:   Diabetes mellitus type 2, controlled, with complications ([place])   Hypertension associated with diabetes ([place])   Chronic diastolic heart failure ([place])   Uncomplicated opioid dependence ([place])   CKD stage 3 due to type 2 diabetes mellitus ([place])   UTI (urinary tract infection)  Jennifer [name] is a 59 year old female with past medical history of type II diabetes, hypertension, chronic diastolic heart failure, left BKA, opioids dependence, CKD 3, nocturnal hypoxemia, who is admitted for generalized weakness secondary to UTI, possible URI, and dehydration from poor p.o. intake.  Generalized weakness Patient presents the ED after a fall.  She has been having multiple falls in the last few weeks due to generalized weakness.  She is using a rolling wheelchair and a walker at home but still falling in the bathrooms.  Of note, patient is requesting a power wheelchair.  Her generalized weakness is likely due to UTI, possible upper respiratory tract infection, and dehydration from poor p.o. intake in the setting of multiple complicated comorbidities.  Other differentials include worsening heart failure.  Low suspicion for CVA or ACS.  CT head is negative for any intracranial process.  Neuro exam was unremarkable.  Troponin was 10 with normal EKG.  CT abdomen/pelvis also did not reveal  any definitive cause.  Hemoglobin at baseline 12.2.  TSH and T4 within normal limits. -Ceftriaxone for UTI -Pending UDS -Monitor vital signs and mental status -PT/OT  Urinary tract infection SIRS criteria 3/4 SIRS criteria, source of infection is likely from UTI even though UA only shows a few bacteria with large leukocyte esterase.  Low suspicion but cannot exclude pyelonephritis or bacteremia.  Lactate of 1.7. -Continue ceftriaxone for UTI -Pending blood culture and urine culture -Trend fever -Tylenol for fever or pain  Abdominal pain Diarrhea Differential includes gastroenteritis. Low suspicion for pancreatitis, cholecystitis, appendicitis or C. difficile. CT abdomen pelvis did not show any definitive cause of her abdominal pain. She has a left lateral abdominal wall oblique muscles intramuscular hematoma likely due to trauma. We will continue supportive care with fluid and advance diet as tolerated. -LR 75 cc/h  Chronic diastolic heart failure Echo 10/16/2020 shows normal EF with normal function LV.  Per chart review, patient has not been taking her diuretics due to nocturia and frequency.  She is also not taking the HCTZ.  Currently satting well on room air.  Chest x-ray is negative for pleural effusion.  Holding diuretics in the setting of AKI.  Her dry weight is 117 kg. -Hold diuretics -Strict I/O -Daily weights -Monitor O2 status  AKI on CKD 3 Baseline creatinine 1.1.  This is likely prerenal secondary to dehydration from poor p.o. intake.  Holding diuretics.  Continue LR at 75 cc/h. -Holding diuretics -LR 75 cc/h -BMP in a.m.  COPD Nocturnal hypoxemia Patient only use oxygen at night for nocturnal hypoxia -Albuterol as needed -Anoro Ellipta -Encourage patient to use oxygen at night  Diabetes Restart Tresiba 70 units daily Sliding scale insulin for mealtime  Hypertension Restart amlodipine and benazepril  Opioid dependence Percocet every 6 as  needed  Peripheral neuropathy Restart pregabalin  Nocturia Restart oxybutynin  Dispo: Admit patient to Inpatient with expected length of stay greater than 2 midnights.  Signed: Gaylan Gerold, DO 10/28/2020, 4:05 PM  Pager: [phone] After 5pm on weekdays and 1pm on weekends: On Call pager: [phone]

## 2020-10-28 NOTE — ED Provider Notes (Signed)
[facility] EMERGENCY DEPARTMENT Provider Note   CSN: [id number] Arrival date & time: 10/28/20  1108     History Chief Complaint  Patient presents with  . Fall  . Head Injury  . Diarrhea    Jennifer [name] is a 59 y.o. female.  59 yo F with multiple falls. Going on for the past week or so. An episode like this about a year ago but none since then. She is not sure why exactly she is falling. She thinks maybe her legs are giving out. He was diffusely weak all over. Started to have aches all over she thinks from a fall she has been having. Has found her head a few times. Complaining of some abdominal pain as well. Having diarrhea that started this morning. Not dark or bloody. No sick contacts. No fevers at home but she thinks maybe she had a temperature here. Has a very mild cough. Denies urinary symptoms. Denies chest pain or pressure. Denies recent medication change. Feels that her speech is maybe slightly slurred. Denies intoxication.  The history is provided by the patient.  Fall This is a recurrent problem. The current episode started more than 2 days ago. The problem occurs rarely. The problem has been gradually worsening. Associated symptoms include abdominal pain. Pertinent negatives include no chest pain, no headaches and no shortness of breath. Nothing aggravates the symptoms. Nothing relieves the symptoms. She has tried nothing for the symptoms. The treatment provided no relief.  Head Injury Associated symptoms: no headaches, no nausea and no vomiting   Diarrhea Associated symptoms: abdominal pain, arthralgias, fever (measured here, not at home) and myalgias   Associated symptoms: no chills, no headaches and no vomiting        Past Medical History:  Diagnosis Date  . Cataract   . Chronic bronchitis ([place])    "I get it alot" (09/28/2013)  . Chronic diastolic heart failure (HCC)    grade 2 per 2D echocardiogram (01/2013)  . Chronic lower back pain   .  Chronic pain syndrome 12/03/2011   Likely secondary to depression, "fibromyalgia", neuropathy, and obesity. Lumbar MRI 2014 no sig change from prior (2008) : Stable hypertrophic facet disease most notable at L4-5. Stable shallow left foraminal/extraforaminal disc protrusion at L4-5. No direct neural compression.      Marland Kitchen COPD 01/08/2007   PFT's 05/2007 : FEV1/FVC 82, FEV1 64% pred, FEF 25-75% 40% predicted, 16% improvement in FEV1 with bronchodilators.     . Diabetic peripheral neuropathy ([place])   . DVT of upper extremity (deep vein thrombosis) ([place]) 03/11/2013   Secondary to PICC line. Right brachial vein, diagnosed on 03/10/2013 Coumadin for 3 months. End date 06/10/2013   . Environmental allergies    Hx: of  . Fatty liver 2003   observed on ultrasound abdomen  . Fibromyalgia   . GERD (gastroesophageal reflux disease)   . Glaucoma   . History of bacterial endocarditis 2014   Endocarditis involving mitral and tricuspid valves.  S. Aureus and GBS.   Marland Kitchen History of use of hearing aid   . Hyperlipidemia   . Hyperplastic colon polyp 12/2010   Per colonoscopy (12/2010) - Dr. Deatra Ina  . Hypertension   . 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 PVD (peripheral vascular disease) with claudication ([place])    Stents to bilateral common iliac arteries (left 2005, right 2008), on chronic plavix  . S/P BKA (below knee amputation)  unilateral ([place])    2014 L - failed limb preserving treatment. 2/2 tobacco use, DM, and cont weight bearing on surgical wound and developed gangrene   . Tobacco abuse   . Type II diabetes mellitus with peripheral circulatory disorders, uncontrolled DX: 1993   Insulin dep. Poor control. Complicated by diabetic foot ulcer and diabetic eye disease.      Patient Active Problem List   Diagnosis Date Noted  . UTI (urinary tract infection) 10/28/2020  . Diabetic polyneuropathy associated with type 2 diabetes mellitus ([place])  04/25/2020  . CKD stage 3 due to type 2 diabetes mellitus ([place]) 10/18/2019  . Urinary incontinence 05/13/2018  . Nocturnal hypoxia 06/12/2017  . Morbid obesity with BMI of 40.0-44.9, adult ([place]) 03/26/2017  . Toe amputation status, right 01/16/2017  . Diabetic retinopathy ([place]) 09/05/2015  . Uncomplicated opioid dependence ([place]) 06/26/2015  . Counseling regarding end of life decision making 06/14/2015  . Anemia 10/05/2014  . Chronic diastolic heart failure ([place])   . Hx of BKA, left ([place])   . Tobacco abuse   . Severe obesity (BMI >= 40) ([place]) 03/02/2013  . Abnormality of gait 03/01/2013  . Healthcare maintenance 07/10/2012  . Chronic prescription opiate use 12/03/2011  . Peripheral arterial disease with history of revascularization ([place]) 08/27/2011  . Hyperplastic colon polyp 12/2010  . 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 ([place]) 11/29/2009  . Hypertension associated with diabetes ([place]) 11/29/2009  . Chronic insomnia 10/25/2009  . GASTROESOPHAGEAL REFLUX DISEASE 11/24/2008  . Depression, major, severe recurrence ([place]) 04/06/2008  . Diabetes mellitus type 2, controlled, with complications ([place]) 66/05/3015  . Hyperlipidemia associated with type 2 diabetes mellitus ([place]) 01/08/2007    Past Surgical History:  Procedure Laterality Date  . ABDOMINAL HYSTERECTOMY  1997   secondary to uterine fibroids  . AMPUTATION Left 08/31/2013   Procedure: AMPUTATION RAY;  Surgeon: Newt Minion, MD;  Location: [facility];  Service: Orthopedics;  Laterality: Left;  Left Foot 5th Ray Amputation  . AMPUTATION Left 09/28/2013   Procedure: Left Midfoot amputation;  Surgeon: Newt Minion, MD;  Location: [facility];  Service: Orthopedics;  Laterality: Left;  Left Midfoot amputation  . AMPUTATION Left 10/14/2013   Procedure: AMPUTATION BELOW KNEE- left;  Surgeon: Newt Minion, MD;  Location: [name];  Service: Orthopedics;  Laterality: Left;  Left Below Knee Amputation   . AMPUTATION TOE Right 01/15/2017    Procedure: AMPUTATION 5th TOE RIGHT FOOT;  Surgeon: Edrick Kins, DPM;  Location: [facility];  Service: Podiatry;  Laterality: Right;  . APPLICATION OF WOUND VAC  04/01/2019   Procedure: Application Of Wound Vac;  Surgeon: Newt Minion, MD;  Location: [facility];  Service: Orthopedics;;  . BLADDER SURGERY     bladder reconstruction surgery  . BREAST BIOPSY     multiple-benign per pt  . COLONOSCOPY    . ESOPHAGOGASTRODUODENOSCOPY N/A 09/20/2013   Procedure: ESOPHAGOGASTRODUODENOSCOPY (EGD);  Surgeon: Jerene Bears, MD;  Location: [facility];  Service: Gastroenterology;  Laterality: N/A;  . FOOT AMPUTATION THROUGH METATARSAL Left 09/28/2013  . GANGLION CYST EXCISION     multiple  . PERIPHERAL VASCULAR INTERVENTION     stents in lower ext  . SHOULDER ARTHROSCOPY Right 11/11/2019   RIGHT SHOULDER ARTHROSCOPY AND DEBRIDEMENT   . SHOULDER ARTHROSCOPY Right 11/11/2019   Procedure: RIGHT SHOULDER ARTHROSCOPY AND DEBRIDEMENT;  Surgeon: Newt Minion, MD;  Location: [facility];  Service: Orthopedics;  Laterality: Right;  . SHOULDER ARTHROSCOPY W/ ROTATOR CUFF  REPAIR Bilateral   . SKIN SPLIT GRAFT Bilateral 05/13/2013   Procedure: Right and Left Foot Allograft Skin Graft;  Surgeon: Newt Minion, MD;  Location: [facility];  Service: Orthopedics;  Laterality: Bilateral;  Right and Left Foot Allograft Skin Graft  . STUMP REVISION Left 04/01/2019   Procedure: REVISION LEFT BELOW KNEE AMPUTATION;  Surgeon: Newt Minion, MD;  Location: [facility];  Service: Orthopedics;  Laterality: Left;  . TEE WITHOUT CARDIOVERSION N/A 01/31/2013   Procedure: TRANSESOPHAGEAL ECHOCARDIOGRAM (TEE);  Surgeon: Fay Records, MD;  Location: [facility];  Service: Cardiovascular;  Laterality: N/A;  Rm [phone]  . TEE WITHOUT CARDIOVERSION N/A 03/10/2013   Procedure: TRANSESOPHAGEAL ECHOCARDIOGRAM (TEE);  Surgeon: Larey Dresser, MD;  Location: [facility];  Service: Cardiovascular;  Laterality: N/A;  Rm. [phone]  . TOE AMPUTATION Left 08/31/2013   4TH &  5 TH TOE   . TONSILLECTOMY    . TUBAL LIGATION    . WRIST SURGERY Right    "for tumors" (09/28/2013)     OB History   No obstetric history on file.     Family History  Problem Relation Age of Onset  . Diverticulosis Mother   . Diabetes Mother   . Hypertension Mother   . Congestive Heart Failure Mother   . Asthma Father   . CAD Sister 22       MI at age 38 per patient.  However, she has not had a stent or CABG.   . Heart disease Sister        before age 87  . Breast cancer Neg Hx     Social History   Tobacco Use  . Smoking status: Current Every Day Smoker    Packs/day: 1.00    Years: 50.00    Pack years: 50.00    Types: Cigarettes  . Smokeless tobacco: Never Used  . Tobacco comment: 1 PPD, 03/02/20 states she is not interested in smoking cessation  Vaping Use  . Vaping Use: Never used  Substance Use Topics  . Alcohol use: No    Alcohol/week: 0.0 standard drinks  . Drug use: No    Types: Marijuana, "Crack" cocaine    Comment: 09/28/2013 "no marijuana since 2011, no crack/cocaine 1989"    Home Medications Prior to Admission medications   Medication Sig Start Date End Date Taking? Authorizing Provider  albuterol (PROAIR HFA) 108 (90 Base) MCG/ACT inhaler INHALE 2 PUFFS BY MOUTH EVERY 6 HOURS AS NEEDED FOR WHEEZING 01/16/20   Bartholomew Crews, MD  amLODipine (NORVASC) 10 MG tablet Take 1 tablet (10 mg total) by mouth daily. 10/26/20 11/25/20  Angelica Pou, MD  aspirin EC 81 MG tablet Take 1 tablet (81 mg total) by mouth daily. 10/26/20   Angelica Pou, MD  baclofen (LIORESAL) 10 MG tablet TAKE 1 TABLET BY MOUTH THREE TIMES A DAY AS NEEDED FOR MUSCLE SPASMS 10/15/20   Angelica Pou, MD  benazepril (LOTENSIN) 40 MG tablet Take 1 tablet (40 mg total) by mouth daily. 09/05/20 10/25/20  Angelica Pou, MD  Biotin 5 MG TABS Take 5 mg by mouth daily.    [provider]  Blood Glucose Monitoring Suppl ([place]) w/Device KIT  Check 4 times a day 02/28/20   Bartholomew Crews, MD  buPROPion (WELLBUTRIN XL) 300 MG 24 hr tablet Take 1 tablet (300 mg total) by mouth daily. 01/16/20   Bartholomew Crews, MD  cholecalciferol (VITAMIN D3) 25 MCG (1000 UT)  tablet Take 1 tablet (1,000 Units total) by mouth daily. 12/02/19   Granville Lewis C, PA-C  glucose blood (ONETOUCH VERIO) test strip 1 each by Other route as needed for other. Use as instructed. Tests 3-4 times daily. 10/15/20   Angelica Pou, MD  hydrochlorothiazide (HYDRODIURIL) 25 MG tablet Take 1 tablet (25 mg total) by mouth daily. 10/15/20   Angelica Pou, MD  hydrOXYzine (VISTARIL) 25 MG capsule Take 50 mg by mouth daily. 10/10/20   [provider]  insulin degludec (TRESIBA FLEXTOUCH) 200 UNIT/ML FlexTouch Pen Inject 70 Units into the skin daily. 10/15/20   Angelica Pou, MD  insulin lispro (HUMALOG KWIKPEN) 100 UNIT/ML KwikPen IF BLOOD SUGAR IS < 175 DO NOT TAKE ANY CORRECTION INSULIN. 176-225 INJECT 2 UNITS. 226-275 INJECT 4 UNITS. 143-888 INJECT 6 UNITS. 326-375 INJECT 8 UNITS. 757-972 INJECT 10 UNITS. 426-475 INJECT 12 UNITS. 820-601 INJECT 14 UNITS AND CALL OFFICE FOR FURTHER INSTRUCTIONS. 08/14/20   Angelica Pou, MD  Insulin Pen Needle 32G X 4 MM MISC Use to inject insulin 4 times a day and semaglutide once weekly 09/05/20   Angelica Pou, MD  Insulin Syringe-Needle U-100 31G X 15/64" 0.3 ML MISC Use with Humalog to correct blood sugar before meals three times a day 12/02/19   Gr[place]ville Lewis C, PA-C  Lancets [facility] ULTRASOFT) lancets Use as instructed 08/14/20   Angelica Pou, MD  metFORMIN (GLUCOPHAGE-XR) 500 MG 24 hr tablet 1 tablet daily with breakfast 01/16/20   Bartholomew Crews, MD  Multiple Vitamin (MULTIVITAMIN WITH MINERALS) TABS tablet Take 1 tablet by mouth daily.    [provider]  nystatin (NYSTATIN) powder Apply 1 application topically 3 (three) times daily. 10/17/20   Angelica Pou,  MD  omeprazole (PRILOSEC) 20 MG capsule TAKE 1 CAPSULE (20 MG TOTAL) BY MOUTH 2 (TWO) TIMES DAILY BEFORE A MEAL. 05/07/20   Bartholomew Crews, MD  oxybutynin (DITROPAN-XL) 5 MG 24 hr tablet Take 5 mg by mouth daily. 10/04/20   [provider]  oxyCODONE-acetaminophen (PERCOCET/ROXICET) 5-325 MG tablet Take 1 tablet by mouth every 8 (eight) hours as needed. May take a 4th pill if severe pain (up to 10 days monthly) #100 equals 30 day supply 07/09/20   Aldine Contes, MD  OZEMPIC, 0.25 OR 0.5 MG/DOSE, 2 MG/1.5ML SOPN Inject into the skin. 09/25/20   [provider]  pregabalin (LYRICA) 200 MG capsule TAKE 1 CAPSULE BY MOUTH THREE TIMES A DAY 10/15/20   Angelica Pou, MD  rosuvastatin (CRESTOR) 20 MG tablet Take 1 tablet (20 mg total) by mouth at bedtime. 10/25/20   Angelica Pou, MD  umeclidinium-vilanterol (ANORO ELLIPTA) 62.5-25 MCG/INH AEPB Inhale 1 puff into the lungs daily. 10/25/20   Angelica Pou, MD  vitamin B-12 (CYANOCOBALAMIN) 500 MCG tablet Take 500 mcg by mouth daily.    [provider]  vitamin C (ASCORBIC ACID) 500 MG tablet Take 500 mg by mouth daily.    [provider]  Potassium Chloride ER 20 MEQ TBCR Take 40 mEq by mouth daily. 01/02/14 01/23/14  Othella Boyer, MD    Allergies    Abilify [aripiprazole], Iohexol, Ivp dye [iodinated diagnostic agents], and Morphine sulfate  Review of Systems   Review of Systems  Constitutional: Positive for fever (measured here, not at home). Negative for chills.  HENT: Negative for congestion and rhinorrhea.   Eyes: Negative for redness and visual disturbance.  Respiratory: Negative for shortness of breath  and wheezing.   Cardiovascular: Negative for chest pain and palpitations.  Gastrointestinal: Positive for abdominal pain and diarrhea. Negative for nausea and vomiting.  Genitourinary: Negative for dysuria and urgency.  Musculoskeletal: Positive for arthralgias and myalgias.  Skin:  Negative for pallor and wound.  Neurological: Negative for dizziness and headaches.    Physical Exam Updated Vital Signs BP (!) 165/64   Pulse 88   Temp 99.5 F (37.5 C) (Oral)   Resp (!) 23   SpO2 100%   Physical Exam Vitals and nursing note reviewed.  Constitutional:      General: She is not in acute distress.    Appearance: She is well-developed. She is obese. She is not diaphoretic.  HENT:     Head: Normocephalic and atraumatic.  Eyes:     Pupils: Pupils are equal, round, and reactive to light.  Cardiovascular:     Rate and Rhythm: Normal rate and regular rhythm.     Heart sounds: No murmur heard.  No friction rub. No gallop.   Pulmonary:     Effort: Pulmonary effort is normal.     Breath sounds: No wheezing or rales.  Abdominal:     General: There is no distension.     Palpations: Abdomen is soft.     Tenderness: There is no abdominal tenderness.  Musculoskeletal:        General: No tenderness.     Cervical back: Normal range of motion and neck supple.  Skin:    General: Skin is warm and dry.  Neurological:     Mental Status: She is alert and oriented to person, place, and time.     Comments: Slightly slurred speech, improved with prompting  Psychiatric:        Behavior: Behavior normal.     ED Results / Procedures / Treatments   Labs (all labs ordered are listed, but only abnormal results are displayed) Labs Reviewed  CBC WITH DIFFERENTIAL/PLATELET - Abnormal; Notable for the following components:      Result Value   WBC 13.7 (*)    Platelets 115 (*)    Neutro Abs 10.2 (*)    Monocytes Absolute 1.2 (*)    All other components within normal limits  COMPREHENSIVE METABOLIC PANEL - Abnormal; Notable for the following components:   Glucose, Bld 241 (*)    Creatinine, Ser 1.34 (*)    Albumin 3.0 (*)    GFR, Estimated 45 (*)    All other components within normal limits  URINALYSIS, ROUTINE W REFLEX MICROSCOPIC - Abnormal; Notable for the following  components:   APPearance CLOUDY (*)    Glucose, UA >=500 (*)    Hgb urine dipstick MODERATE (*)    Protein, ur 100 (*)    Leukocytes,Ua LARGE (*)    WBC, UA >50 (*)    Bacteria, UA FEW (*)    All other components within normal limits  CBG MONITORING, ED - Abnormal; Notable for the following components:   Glucose-Capillary 250 (*)    All other components within normal limits  RESPIRATORY PANEL BY RT PCR (FLU A&B, COVID)  CULTURE, BLOOD (ROUTINE X 2)  CULTURE, BLOOD (ROUTINE X 2)  MAGNESIUM  TSH  T4, FREE  LACTIC ACID, PLASMA  ETHANOL  BRAIN NATRIURETIC PEPTIDE  HIV ANTIBODY (ROUTINE TESTING W REFLEX)  CBC  CREATININE, SERUM  I-STAT CHEM 8, ED  TROPONIN I (HIGH SENSITIVITY)    EKG EKG Interpretation  Date/Time:  Sunday October 28 2020 11:21:15 EST Ventricular Rate:  82 PR Interval:    QRS Duration: 90 QT Interval:  381 QTC Calculation: 445 R Axis:   -14 Text Interpretation: Sinus rhythm Low voltage, precordial leads No significant change since last tracing Confirmed by Deno Etienne [phone]) on 10/28/2020 1:10:33 PM   Radiology CT ABDOMEN PELVIS WO CONTRAST  Result Date: 10/28/2020 CLINICAL DATA:  Right lower quadrant pain, multiple falls today EXAM: CT ABDOMEN AND PELVIS WITHOUT CONTRAST TECHNIQUE: Multidetector CT imaging of the abdomen and pelvis was performed following the standard protocol without IV contrast. COMPARISON:  07/19/2019 FINDINGS: Lower chest: No acute abnormality. Hepatobiliary: Limited without IV contrast. No large focal abnormality. Intrahepatic biliary dilatation. Gallbladder collapsed. Common bile duct nondilated. Pancreas: Unremarkable. No pancreatic ductal dilatation or surrounding inflammatory changes. Spleen: Normal in size without focal abnormality. Adrenals/Urinary Tract: Normal adrenal glands. Kidneys demonstrate nonspecific Peri septal strandy edema without obstruction or acute hydronephrosis. No hydroureter or ureteral calculus. Bladder  unremarkable. Right kidney demonstrates an ill-defined hyperdense lesion measuring up to 3 cm in the mid to lower pole area. When correlating with a previous remote ultrasound the patient did have a right renal cyst in this region. Therefore, this may represent a hyperdense cyst with hemorrhage or proteinaceous debris. Stomach/Bowel: Negative for bowel obstruction, significant dilatation, ileus, or free air. Normal retrocecal appendix. No free fluid, fluid collection, abscess, or ascites. Vascular/Lymphatic: Aorta atherosclerotic. Extensive aortoiliac bifurcation atherosclerosis with common iliac stents noted. Negative for aneurysm. No bulky adenopathy. Reproductive: Remote hysterectomy. Other: Intact abdominal.  No hernia. Musculoskeletal: Left lateral abdominal wall musculature demonstrates asymmetric nodular enlargement, images 23 through 40 series 3. This remains nonspecific but in the setting of trauma may represent a left lateral abdominal elongated mild intramuscular hematoma. No large retroperitoneal hematoma. No hemoperitoneum. Degenerative changes noted spine. Acute osseous finding. Mild facet arthropathy. Visualized lower ribs appear intact. IMPRESSION: Asymmetric nodular elongated enlargement of the left lateral abdominal wall oblique musculature may represent mild abdominal wall intramuscular hematoma in the setting of trauma. Right kidney lower pole endophytic hyperdense 3 cm lesion appears to correlate with the location of a previous renal cyst by ultrasound. This remains indeterminate but favored to be a hyperdense cyst with protein or hemorrhage. Nonspecific perinephric septal edema without hydronephrosis or obstruction Aortic Atherosclerosis (ICD10-I70.0). Electronically Signed   By: Jerilynn Mages.  Shick M.D.   On: 10/28/2020 14:45   CT Head Wo Contrast  Result Date: 10/28/2020 CLINICAL DATA:  59 y/o female here due to 4 falls yesterday, 3 falls todtay . Pt reports has hit head several times in past  several days, c/o right leg pain. Onset today diarrhea. C/o shortness of breath but this is usually normal for patient. EXAM: CT HEAD WITHOUT CONTRAST TECHNIQUE: Contiguous axial images were obtained from the base of the skull through the vertex without intravenous contrast. COMPARISON:  CT head 05/11/2019 FINDINGS: Brain: No evidence of acute infarction, hemorrhage, hydrocephalus, extra-axial collection or mass lesion/mass effect. Vascular: No hyperdense vessel or unexpected calcification. Skull: Normal. Negative for fracture or focal lesion. Sinuses/Orbits: No acute finding. Other: None. IMPRESSION: No acute intracranial process. Electronically Signed   By: Audie Pinto M.D.   On: 10/28/2020 14:15   DG Chest Port 1 View  Result Date: 10/28/2020 CLINICAL DATA:  Fatigue EXAM: PORTABLE CHEST 1 VIEW COMPARISON:  10/17/2019 chest radiograph. FINDINGS: Stable cardiomediastinal silhouette with top-normal heart size. No pneumothorax. No pleural effusion. Lungs appear clear, with no acute consolidative airspace disease and no pulmonary edema. IMPRESSION: No active disease. Electronically Signed   By: Corene Cornea  A Poff M.D.   On: 10/28/2020 13:51    Procedures Procedures (including critical care time)  Medications Ordered in ED Medications  cefTRIAXone (ROCEPHIN) 1 g in sodium chloride 0.9 % 100 mL IVPB (has no administration in time range)  oxyCODONE-acetaminophen (PERCOCET/ROXICET) 5-325 MG per tablet 1 tablet (has no administration in time range)  amLODipine (NORVASC) tablet 10 mg (has no administration in time range)  benazepril (LOTENSIN) tablet 40 mg (has no administration in time range)  rosuvastatin (CRESTOR) tablet 20 mg (has no administration in time range)  buPROPion (WELLBUTRIN XL) 24 hr tablet 300 mg (has no administration in time range)  hydrOXYzine (VISTARIL) capsule 50 mg (has no administration in time range)  insulin degludec (TRESIBA) FlexTouch Pen SOPN 70 Units (has no administration in  time range)  pantoprazole (PROTONIX) EC tablet 40 mg (has no administration in time range)  oxybutynin (DITROPAN-XL) 24 hr tablet 5 mg (has no administration in time range)  pregabalin (LYRICA) capsule 200 mg (has no administration in time range)  albuterol (VENTOLIN HFA) 108 (90 Base) MCG/ACT inhaler 1-2 puff (has no administration in time range)  umeclidinium-vilanterol (ANORO ELLIPTA) 62.5-25 MCG/INH 1 puff (has no administration in time range)  enoxaparin (LOVENOX) injection 40 mg (has no administration in time range)  lactated ringers infusion (has no administration in time range)  acetaminophen (TYLENOL) tablet 650 mg (has no administration in time range)    Or  acetaminophen (TYLENOL) suppository 650 mg (has no administration in time range)  senna (SENOKOT) tablet 8.6 mg (has no administration in time range)  insulin aspart (novoLOG) injection 0-20 Units (has no administration in time range)  sodium chloride 0.9 % bolus 1,000 mL (0 mLs Intravenous Stopped 10/28/20 1258)  cefTRIAXone (ROCEPHIN) 1 g in sodium chloride 0.9 % 100 mL IVPB (1 g Intravenous New Bag/Given 10/28/20 1420)    ED Course  I have reviewed the triage vital signs and the nursing notes.  Pertinent labs & imaging results that were available during my care of the patient were reviewed by me and considered in my medical decision making (see chart for details).    MDM Rules/Calculators/A&P                          59 yo F with a chief complaint of generalized fatigue. Going on for about a week. Has had frequent falls with this. At the point where she had trouble getting out of bed today. Difficult review of systems. Patient unwilling to endorse anything and then everything. Pain of pain all over her body. No reported fevers at home but has a temperature of 100 here. She is mildly tachypneic. Will obtain a chest x-ray lab work. She has right-sided abdominal tenderness worse to the upper than lower. Will obtain a CT scan.  Bolus of IV fluids reassess.  CT scan without obvious pathology.  UA with large white cells and rare bacteria.  Given a dose of Rocephin.  Discussed with medicine for admission.  The patients results and plan were reviewed and discussed.   Any x-rays performed were independently reviewed by myself.   Differential diagnosis were considered with the presenting HPI.  Medications  cefTRIAXone (ROCEPHIN) 1 g in sodium chloride 0.9 % 100 mL IVPB (has no administration in time range)  oxyCODONE-acetaminophen (PERCOCET/ROXICET) 5-325 MG per tablet 1 tablet (has no administration in time range)  amLODipine (NORVASC) tablet 10 mg (has no administration in time range)  benazepril (LOTENSIN) tablet 40  mg (has no administration in time range)  rosuvastatin (CRESTOR) tablet 20 mg (has no administration in time range)  buPROPion (WELLBUTRIN XL) 24 hr tablet 300 mg (has no administration in time range)  hydrOXYzine (VISTARIL) capsule 50 mg (has no administration in time range)  insulin degludec (TRESIBA) FlexTouch Pen SOPN 70 Units (has no administration in time range)  pantoprazole (PROTONIX) EC tablet 40 mg (has no administration in time range)  oxybutynin (DITROPAN-XL) 24 hr tablet 5 mg (has no administration in time range)  pregabalin (LYRICA) capsule 200 mg (has no administration in time range)  albuterol (VENTOLIN HFA) 108 (90 Base) MCG/ACT inhaler 1-2 puff (has no administration in time range)  umeclidinium-vilanterol (ANORO ELLIPTA) 62.5-25 MCG/INH 1 puff (has no administration in time range)  enoxaparin (LOVENOX) injection 40 mg (has no administration in time range)  lactated ringers infusion (has no administration in time range)  acetaminophen (TYLENOL) tablet 650 mg (has no administration in time range)    Or  acetaminophen (TYLENOL) suppository 650 mg (has no administration in time range)  senna (SENOKOT) tablet 8.6 mg (has no administration in time range)  insulin aspart (novoLOG) injection  0-20 Units (has no administration in time range)  sodium chloride 0.9 % bolus 1,000 mL (0 mLs Intravenous Stopped 10/28/20 1258)  cefTRIAXone (ROCEPHIN) 1 g in sodium chloride 0.9 % 100 mL IVPB (1 g Intravenous New Bag/Given 10/28/20 1420)    Vitals:   10/28/20 1321 10/28/20 1407 10/28/20 1415 10/28/20 1421  BP:  (!) 163/67 (!) 165/64   Pulse: 84 86 88   Resp:  (!) 22 (!) 23   Temp:    99.5 F (37.5 C)  TempSrc:    Oral  SpO2: 100% 99% 100%     Final diagnoses:  Acute cystitis without hematuria    Admission/ observation were discussed with the admitting physician, patient and/or family and they are comfortable with the plan.   Final Clinical Impression(s) / ED Diagnoses Final diagnoses:  Acute cystitis without hematuria    Rx / DC Orders ED Discharge Orders    None       Deno Etienne, DO 10/28/20 1604

## 2020-10-28 NOTE — ED Notes (Signed)
Pt placed on 2L O2 via [place] per at home bedtime dose.

## 2020-10-29 ENCOUNTER — Telehealth: Payer: Medicare Other

## 2020-10-29 DIAGNOSIS — I130 Hypertensive heart and chronic kidney disease with heart failure and stage 1 through stage 4 chronic kidney disease, or unspecified chronic kidney disease: Secondary | ICD-10-CM

## 2020-10-29 DIAGNOSIS — Z9181 History of falling: Secondary | ICD-10-CM

## 2020-10-29 DIAGNOSIS — E669 Obesity, unspecified: Secondary | ICD-10-CM

## 2020-10-29 DIAGNOSIS — F1120 Opioid dependence, uncomplicated: Secondary | ICD-10-CM

## 2020-10-29 DIAGNOSIS — R531 Weakness: Secondary | ICD-10-CM

## 2020-10-29 DIAGNOSIS — R6510 Systemic inflammatory response syndrome (SIRS) of non-infectious origin without acute organ dysfunction: Secondary | ICD-10-CM

## 2020-10-29 DIAGNOSIS — R0902 Hypoxemia: Secondary | ICD-10-CM

## 2020-10-29 DIAGNOSIS — E1122 Type 2 diabetes mellitus with diabetic chronic kidney disease: Secondary | ICD-10-CM

## 2020-10-29 DIAGNOSIS — N179 Acute kidney failure, unspecified: Secondary | ICD-10-CM

## 2020-10-29 DIAGNOSIS — E1142 Type 2 diabetes mellitus with diabetic polyneuropathy: Secondary | ICD-10-CM

## 2020-10-29 DIAGNOSIS — R351 Nocturia: Secondary | ICD-10-CM

## 2020-10-29 DIAGNOSIS — E860 Dehydration: Secondary | ICD-10-CM

## 2020-10-29 DIAGNOSIS — J449 Chronic obstructive pulmonary disease, unspecified: Secondary | ICD-10-CM

## 2020-10-29 DIAGNOSIS — I5032 Chronic diastolic (congestive) heart failure: Secondary | ICD-10-CM

## 2020-10-29 DIAGNOSIS — N390 Urinary tract infection, site not specified: Secondary | ICD-10-CM

## 2020-10-29 DIAGNOSIS — N183 Chronic kidney disease, stage 3 unspecified: Secondary | ICD-10-CM

## 2020-10-29 LAB — BASIC METABOLIC PANEL
Anion gap: 9 (ref 5–15)
BUN: 17 mg/dL (ref 8–23)
CO2: 24 mmol/L (ref 22–32)
Calcium: 8.8 mg/dL — ABNORMAL LOW (ref 8.9–10.3)
Chloride: 103 mmol/L (ref 98–111)
Creatinine, Ser: 1.32 mg/dL — ABNORMAL HIGH (ref 0.44–1.00)
GFR, Estimated: 46 mL/min — ABNORMAL LOW (ref >60)
Glucose, Bld: 267 mg/dL — ABNORMAL HIGH (ref 70–99)
Potassium: 3.9 mmol/L (ref 3.5–5.1)
Sodium: 136 mmol/L (ref 135–145)

## 2020-10-29 LAB — CBG MONITORING, ED
Glucose-Capillary: 224 mg/dL — ABNORMAL HIGH (ref 70–99)
Glucose-Capillary: 228 mg/dL — ABNORMAL HIGH (ref 70–99)
Glucose-Capillary: 167 mg/dL — ABNORMAL HIGH (ref 70–99)

## 2020-10-29 LAB — CBC
HCT: 37.5 % (ref 36.0–46.0)
Hemoglobin: 12.1 g/dL (ref 12.0–15.0)
MCH: 30.9 pg (ref 26.0–34.0)
MCHC: 32.3 g/dL (ref 30.0–36.0)
MCV: 95.9 fL (ref 80.0–100.0)
Platelets: 109 10*3/uL — ABNORMAL LOW (ref 150–400)
RBC: 3.91 MIL/uL (ref 3.87–5.11)
RDW: 14 % (ref 11.5–15.5)
WBC: 10.6 10*3/uL — ABNORMAL HIGH (ref 4.0–10.5)
nRBC: 0 % (ref 0.0–0.2)

## 2020-10-29 LAB — GLUCOSE, CAPILLARY: Glucose-Capillary: 188 mg/dL — ABNORMAL HIGH (ref 70–99)

## 2020-10-29 MED ORDER — INSULIN GLARGINE 100 UNIT/ML ~~LOC~~ SOLN
75.0000 [IU] | Freq: Every day | SUBCUTANEOUS | Status: DC
  Administered 2020-10-30: 75 [IU] via SUBCUTANEOUS
  Filled 2020-10-29: qty 0.75

## 2020-10-29 MED ORDER — OXYCODONE-ACETAMINOPHEN 5-325 MG PO TABS
1.0000 | ORAL_TABLET | Freq: Three times a day (TID) | ORAL | Status: DC | PRN
  Administered 2020-10-29 – 2020-10-31 (×5): 1 via ORAL
  Filled 2020-10-29 (×5): qty 1

## 2020-10-29 MED ORDER — AMOXICILLIN-POT CLAVULANATE 875-125 MG PO TABS: 1.0000 | ORAL_TABLET | Freq: Two times a day (BID) | ORAL | Status: DC

## 2020-10-29 MED ORDER — SODIUM CHLORIDE 0.9 % IV SOLN
1.0000 g | INTRAVENOUS | Status: AC
  Administered 2020-10-29 – 2020-10-31 (×3): 1 g via INTRAVENOUS
  Filled 2020-10-29 (×2): qty 10
  Filled 2020-10-29: qty 1

## 2020-10-29 NOTE — Progress Notes (Signed)
  Date: 10/29/2020  Patient name: Jennifer [name]  Medical record number: [id number]  Date of birth: [birth year]   I have seen and evaluated Jennifer [name] and discussed their care with the Residency Team.  In brief, patient is a 59 year old female with a past medical history of type 2 diabetes, hypertension, chronic diastolic heart failure and a left BKA, chronic opiate use, CKD stage IIIa who presented to the ED after a fall.  Patient was having breakfast this morning and complained of generalized pain but did not want to be examined and did not want to answer many questions.  Per chart, patient is improving weaker over the last week and has had multiple falls at home.  Patient has been complaining of pain secondary to trauma from her falls.  No loss of consciousness.  Patient has been using a wheelchair and a walker at home for ambulation and is able to use the bathroom on her own.  She complained of diffuse pain in her body including her back and chest as well as her abdomen.  She also complained of chills and diarrhea at home.  Patient also complained of decreased oral intake secondary to not feeling well.  Patient has been compliant with her medications.  Patient son also noted that patient strength is progressively worsened and she kept dropping objects that she was holding.  She fell in her bathroom on the day of admission and he called an ambulance to bring her to the hospital for further evaluation.  Today, patient was seen eating her meal this morning and complaining of generalized pain and feeling sick.  PMHx, Fam Hx, and/or Soc Hx : As per resident admit note  Vitals:   10/29/20 1030 10/29/20 1223  BP: (!) 143/70 113/64  Pulse: 75 80  Resp: 16 20  Temp:  (!) 101.1 F (38.4 C)  SpO2: 94% 100%   General: Awake, alert, oriented x3, NAD CVS: Patient refused exam Lungs: Patient refused exam Abdomen: Patient refused exam Extremities: Patient refused exam Psych: Appears irritable this  morning HEENT: Normocephalic, atraumatic Neuro: Oriented x3 Skin: Warm and dry  Assessment and Plan: I have seen and evaluated the patient as outlined above. I agree with the formulated Assessment and Plan as detailed in the residents' note, with the following changes:   1.  Progressive weakness at home likely secondary to UTI: -Patient presented to the ED with multiple falls and progressive weakness at home and was found to have a urinary tract infection.  Patient also had a fever up to 101.1 F today and mild leukocytosis up to 11.4 on admission -We will continue ceftriaxone for UTI -Patient is tolerating oral intake.  We will continue with lactated Ringer's at 75 cc/h today but DC if she continues to improve -Patient noted to have a mildly elevated creatinine from her baseline (up to 1.3 from baseline of 1.1-1.2).  We will hold diuretics and continue to monitor -Patient is also on multiple centrally acting medications including bupropion, Lyrica, Percocet, oxybutynin, baclofen and hydroxyzine.  I suspect this may be contributing to her weakness as well.  Patient to follow-up with PCP to see if we can safely take her off some of these medications. -We will follow PT/OT evaluation -We will monitor CBC daily -No further work-up at this time.  Aldine Contes, MD 11/8/20212:28 PM

## 2020-10-29 NOTE — Progress Notes (Signed)
New Admission Note:  Arrival Method: Arrived via stretcher from ER Mental Orientation: a & O x4 Skin: PT has a Large Callus on her R heel, Patient has healing scabs throughout her R leg, PT has a old healing scab under her L stump.  IV:L AC Pain: 8/10 Safety Measures: Safety Fall Prevention Plan was given, discussed. 5M01: Patient has been orientated to the room, unit and the staff. Family: Pt updated family   Orders have been reviewed and implemented. Will continue to monitor the patient. Call light has been placed within reach and bed alarm has been activated.   Arta Silence ,RN

## 2020-10-29 NOTE — ED Notes (Signed)
Pt provided hygiene care and fresh linens.

## 2020-10-29 NOTE — Progress Notes (Signed)
Subjective:   Hospital day: 1  Overnight event: No acute event overnight  Patient seen at bedside.  She was eating her meal during examination.  Patient complains of generalized pain and not feeling better.  Objective:  Vital signs in last 24 hours: Vitals:   10/29/20 0115 10/29/20 0245 10/29/20 0415 10/29/20 0545  BP: (!) 119/58 (!) 112/47 101/80 (!) 155/129  Pulse: 74 69 80 76  Resp: 16 18 18 16   Temp:      TempSrc:      SpO2: 100% 100% 100% 100%   CBC Latest Ref Rng & Units 10/29/2020 10/28/2020 10/28/2020  WBC 4.0 - 10.5 K/uL 10.6(H) 11.4(H) 13.7(H)  Hemoglobin 12.0 - 15.0 g/dL 12.1 12.3 12.2  Hematocrit 36 - 46 % 37.5 37.1 37.7  Platelets 150 - 400 K/uL 109(L) 115(L) 115(L)   CMP Latest Ref Rng & Units 10/29/2020 10/28/2020 10/28/2020  Glucose 70 - 99 mg/dL 267(H) - 241(H)  BUN 8 - 23 mg/dL 17 - 18  Creatinine 0.44 - 1.00 mg/dL 1.32(H) 1.23(H) 1.34(H)  Sodium 135 - 145 mmol/L 136 - 137  Potassium 3.5 - 5.1 mmol/L 3.9 - 4.5  Chloride 98 - 111 mmol/L 103 - 103  CO2 22 - 32 mmol/L 24 - 24  Calcium 8.9 - 10.3 mg/dL 8.8(L) - 9.1  Total Protein 6.5 - 8.1 g/dL - - 7.1  Total Bilirubin 0.3 - 1.2 mg/dL - - 0.6  Alkaline Phos 38 - 126 U/L - - 93  AST 15 - 41 U/L - - 22  ALT 0 - 44 U/L - - 19    Physical Exam  Physical Exam Constitutional:      General: She is not in acute distress.    Appearance: She is obese.  HENT:     Head: Normocephalic.  Eyes:     General:        Right eye: No discharge.        Left eye: No discharge.  Cardiovascular:     Rate and Rhythm: Normal rate and regular rhythm.  Pulmonary:     Effort: No respiratory distress.     Breath sounds: Normal breath sounds.  Abdominal:     General: Bowel sounds are normal.     Palpations: Abdomen is soft.     Tenderness: There is abdominal tenderness (Diffuse tenderness to palpation).  Musculoskeletal:     Cervical back: Normal range of motion.  Neurological:     Mental Status: She is alert.      Assessment/Plan: Jennifer [name] is a 59 year old female with past medical history of type II diabetes, hypertension, chronic diastolic heart failure, left BKA, opioids dependence, CKD 3, nocturnal hypoxemia, who is admitted for generalized weakness secondary to UTI, polypharmacy, and dehydration from poor p.o. intake.  Principal Problem:   Generalized weakness Active Problems:   Diabetes mellitus type 2, controlled, with complications ([place])   Hypertension associated with diabetes ([place])   Chronic diastolic heart failure ([place])   Uncomplicated opioid dependence ([place])   CKD stage 3 due to type 2 diabetes mellitus ([place])   UTI (urinary tract infection)  Generalized weakness Patient presents the ED after a fall.  She has been having multiple falls in the last few weeks due to generalized weakness.  She is using a rolling wheelchair and a walker at home but still falling in the bathrooms.  Of note, patient is requesting a power wheelchair.  Her generalized weakness is likely due to UTI, possible upper respiratory tract infection,  polypharmacy and dehydration from poor p.o. intake in the setting of multiple complicated comorbidities.  Other differentials include worsening heart failure.  Low suspicion for CVA or ACS.  CT head is negative for any intracranial process.  Neuro exam was unremarkable.  Troponin was 10 with normal EKG.  CT abdomen/pelvis also did not reveal any definitive cause.  Hemoglobin at baseline 12.2.  TSH and T4 within normal limits. -Reviewing her medication list, patient is on many medications that can cause drowsiness such as oxybutynin, baclofen, hydroxyzine, Percocet, Lyrica and bupropion.  We will work with her PCP, Dr. Jimmye [name], to hopefully take off some of her medications that can cause her to be unbalanced. -Ceftriaxone for UTI.   -LR 75 cc/h -Monitor vital signs and mental status -PT/OT  Urinary tract infection SIRS criteria 3/4 SIRS criteria, source of infection  is likely from UTI even though UA only shows a few bacteria with large leukocyte esterase.  Low suspicion but cannot exclude pyelonephritis or bacteremia.  Lactate of 1.7.  Patient spiked a fever of 101.1 this afternoon so continue IV ceftriaxone. -Continue ceftriaxone for UTI -Pending blood culture and urine culture -Trend fever -Tylenol for fever or pain  Abdominal pain Diarrhea Differential includes gastroenteritis. Low suspicion for pancreatitis, cholecystitis, appendicitis or C. difficile. CT abdomen pelvis did not show any definitive cause of her abdominal pain. She has a left lateral abdominal wall oblique muscles intramuscular hematoma likely due to trauma. We will continue supportive care with fluid and advance diet as tolerated. -LR 75 cc/h  Chronic diastolic heart failure Echo 10/16/2020 shows normal EF with normal function LV.  Per chart review, patient has not been taking her diuretics due to nocturia and frequency.  She is also not taking the HCTZ.  Currently satting well on room air.  Chest x-ray is negative for pleural effusion.  Holding diuretics in the setting of AKI.  Her dry weight is 117 kg. -Hold diuretics -Strict I/O -Daily weights -Monitor O2 status  AKI on CKD 3 Baseline creatinine 1.1.    Current creatinine 1.32, likely prerenal secondary to dehydration from poor p.o. intake.  Holding diuretics.  Continue LR at 75 cc/h. -Holding diuretics -Encourage p.o. intake -LR 75 cc/h -BMP in a.m.  COPD Nocturnal hypoxemia Patient only use oxygen at night for nocturnal hypoxia -Albuterol as needed -Anoro Ellipta -Oxygen at night  Diabetes Increase Lantus 75 units daily Sliding scale insulin for mealtime  Hypertension Restart amlodipine and benazepril  Opioid dependence Percocet every 8h as needed  Peripheral neuropathy Restart pregabalin  Nocturia Holding oxybutynin   Diet: Thin fluid IVF: LR VTE: Lovenox CODE: Full  Prior to Admission  Living Arrangement: Home Anticipated Discharge Location: To be determined Barriers to Discharge: Ongoing infection, PT/OT Dispo: Anticipated discharge in approximately 1-2 day(s).   Jennifer Gerold, DO 10/29/2020, 6:55 AM Pager: [phone] After 5pm on weekdays and 1pm on weekends: On Call pager [phone]

## 2020-10-30 LAB — BASIC METABOLIC PANEL
Anion gap: 9 (ref 5–15)
BUN: 17 mg/dL (ref 8–23)
CO2: 23 mmol/L (ref 22–32)
Calcium: 8.6 mg/dL — ABNORMAL LOW (ref 8.9–10.3)
Chloride: 104 mmol/L (ref 98–111)
Creatinine, Ser: 1.26 mg/dL — ABNORMAL HIGH (ref 0.44–1.00)
GFR, Estimated: 49 mL/min — ABNORMAL LOW (ref >60)
Glucose, Bld: 244 mg/dL — ABNORMAL HIGH (ref 70–99)
Potassium: 3.7 mmol/L (ref 3.5–5.1)
Sodium: 136 mmol/L (ref 135–145)

## 2020-10-30 LAB — CBC
HCT: 32.2 % — ABNORMAL LOW (ref 36.0–46.0)
Hemoglobin: 10.5 g/dL — ABNORMAL LOW (ref 12.0–15.0)
MCH: 30.6 pg (ref 26.0–34.0)
MCHC: 32.6 g/dL (ref 30.0–36.0)
MCV: 93.9 fL (ref 80.0–100.0)
Platelets: 108 10*3/uL — ABNORMAL LOW (ref 150–400)
RBC: 3.43 MIL/uL — ABNORMAL LOW (ref 3.87–5.11)
RDW: 13.8 % (ref 11.5–15.5)
WBC: 8.5 10*3/uL (ref 4.0–10.5)
nRBC: 0 % (ref 0.0–0.2)

## 2020-10-30 LAB — GLUCOSE, CAPILLARY
Glucose-Capillary: 253 mg/dL — ABNORMAL HIGH (ref 70–99)
Glucose-Capillary: 224 mg/dL — ABNORMAL HIGH (ref 70–99)
Glucose-Capillary: 199 mg/dL — ABNORMAL HIGH (ref 70–99)
Glucose-Capillary: 169 mg/dL — ABNORMAL HIGH (ref 70–99)

## 2020-10-30 LAB — URINE CULTURE: Culture: <10000 — AB

## 2020-10-30 MED ORDER — HYDROXYZINE HCL 25 MG PO TABS
50.0000 mg | ORAL_TABLET | Freq: Every day | ORAL | Status: DC
  Administered 2020-10-30 – 2020-10-31 (×2): 50 mg via ORAL
  Filled 2020-10-30 (×2): qty 2

## 2020-10-30 MED ORDER — INSULIN GLARGINE 100 UNIT/ML ~~LOC~~ SOLN
80.0000 [IU] | Freq: Every day | SUBCUTANEOUS | Status: DC
  Administered 2020-10-31: 80 [IU] via SUBCUTANEOUS
  Filled 2020-10-30: qty 0.8

## 2020-10-30 NOTE — Evaluation (Addendum)
Occupational Therapy Evaluation Patient Details Name: Jennifer [name] MRN: [id number] DOB: [birth year] Today's Date: 10/30/2020    History of Present Illness 59 yo female presenting to the ED after a fall and weakness. Has had multiple falls at home, often hitting her head as well as other body parts, and son called EMS after this most recent fall. CTH and CT pelvis negative for acute injury. Admitted with UTI, AKI, weakness. PMH heart failure, LBP, chronic pain, COPD, DM with peripheral neuropathy, fibromyalgia, HLD, HTN, RA, wrist surgery, toe amputations, shoulder arthroscopy, BKA   Clinical Impression   PTA patient reports independent with ADLs, mobility using rollator at home but several falls (13) in the past week.  Admitted for above and limited by problem list below, including generalized weakness, impaired balance and decreased activity tolerance. Pt oriented, pleasant and highly motivated, some deficits seen in safety awareness and problem solving.  Patient currently requires min assist for bed mobility, min assist +2 for transfers, and min-mod assist +2 for ADLs.  Believe she will best benefit from continued OT services acutely and after dc at CIR level to optimize independence, safety with ADls, mobility prior to dc home.  Will follow acutely.     Follow Up Recommendations  CIR;Supervision/Assistance - 24 hour    Equipment Recommendations  None recommended by OT    Recommendations for Other Services Rehab consult     Precautions / Restrictions Precautions Precautions: Fall;Other (comment) Precaution Comments: chronic L BKA (prosthetic in room), reports hallucinations, frequent falls Restrictions Weight Bearing Restrictions: No      Mobility Bed Mobility Overal bed mobility: Needs Assistance Bed Mobility: Supine to Sit;Sit to Supine     Supine to sit: Min guard;HOB elevated Sit to supine: Min assist   General bed mobility comments: Min guard and increased time/effort  to get to EOB; needed MinA to return to supine    Transfers Overall transfer level: Needs assistance Equipment used: Rolling walker (2 wheeled) Transfers: Sit to/from Stand Sit to Stand: Min assist;+2 physical assistance         General transfer comment: MinAx2 to boost to upright standing with RW, great awareness of hand placement and sequencing; able to take side steps along side of bed with MinAx2 but had some difficulty following cues as she initially stepped forward instead of sideways    Balance Overall balance assessment: History of Falls;Needs assistance Sitting-balance support: Feet supported Sitting balance-Leahy Scale: Good Sitting balance - Comments: able to put on socks and manage prosthetic at EOB   Standing balance support: Bilateral upper extremity supported;During functional activity Standing balance-Leahy Scale: Poor Standing balance comment: MinAx2 to maintain balance statically and dynamically with BUE support                           ADL either performed or assessed with clinical judgement   ADL Overall ADL's : Needs assistance/impaired     Grooming: Set up;Sitting   Upper Body Bathing: Set up;Sitting   Lower Body Bathing: Sit to/from stand;+2 for physical assistance;+2 for safety/equipment;Moderate assistance   Upper Body Dressing : Set up;Sitting   Lower Body Dressing: Moderate assistance;Sit to/from stand;+2 for physical assistance;+2 for safety/equipment Lower Body Dressing Details (indicate cue type and reason): increased time to don L prosthetic and R sock/shoe, min assist +2 sit to stand but relies on BUE support Toilet Transfer: Minimal assistance;RW Toilet Transfer Details (indicate cue type and reason): side stepping towards [facility]  Functional mobility during ADLs: Minimal assistance;+2 for physical assistance;+2 for safety/equipment;Rolling walker General ADL Comments: pt limited by pain, weakness, balance     Vision    Additional Comments: Dysconjugate gaze noted, further assessment required     Perception     Praxis      Pertinent Vitals/Pain Pain Assessment: Faces Faces Pain Scale: Hurts little more Pain Location: back and thighs Pain Descriptors / Indicators: Aching;Discomfort;Tingling;Numbness Pain Intervention(s): Limited activity within patient's tolerance;Monitored during session;Repositioned     Hand Dominance Right   Extremity/Trunk Assessment Upper Extremity Assessment Upper Extremity Assessment: Generalized weakness   Lower Extremity Assessment Lower Extremity Assessment: Defer to PT evaluation   Cervical / Trunk Assessment Cervical / Trunk Assessment: Kyphotic   Communication Communication Communication: No difficulties   Cognition Arousal/Alertness: Awake/alert Behavior During Therapy: WFL for tasks assessed/performed;Flat affect Overall Cognitive Status: Impaired/Different from baseline Area of Impairment: Problem solving;Safety/judgement                         Safety/Judgement: Decreased awareness of safety;Decreased awareness of deficits   Problem Solving: Slow processing;Decreased initiation;Difficulty sequencing;Requires verbal cues General Comments: cooperative and motivated, decreased safety awareness and decreased problem solving    General Comments  VSS     Exercises     Shoulder Instructions      Home Living Family/patient expects to be discharged to:: Private residence Living Arrangements: Children;Other relatives (sons, sister and sisters husband ) Available Help at Discharge: Family;Available 24 hours/day Type of Home: House Home Access: Stairs to enter [employer] of Steps: 4-5  with L ascending rail Entrance Stairs-Rails: Left Home Layout: One level     Bathroom Shower/Tub: Tub/shower unit;Other (comment)   Bathroom Toilet: Handicapped height Bathroom Accessibility: Yes   Home Equipment: Bedside commode;Tub  bench;Shower seat;Wheelchair - [employer] - 4 wheels;Walker - 2 wheels   Additional Comments: mostly uses rollator; walks inside the house with rollator and uses WC outside of house. Has had about 13 falls in the past week, her sons come and help her get up when she does fall.      Prior Functioning/Environment Level of Independence: Needs assistance  Gait / Transfers Assistance Needed: independent with rollator despite falls ADL's / Homemaking Assistance Needed: independent with bathing/dressing, family splits cooking duty   Comments: not on O2 at home; reports she has been hallucinating and is concerned that she might have Parkinsons        OT Problem List: Decreased strength;Decreased activity tolerance;Impaired balance (sitting and/or standing);Decreased safety awareness;Decreased knowledge of use of DME or AE;Decreased knowledge of precautions;Obesity;Pain      OT Treatment/Interventions: Self-care/ADL training;Therapeutic exercise;DME and/or AE instruction;Cognitive remediation/compensation;Therapeutic activities;Patient/family education;Balance training    OT Goals(Current goals can be found in the care plan section) Acute Rehab OT Goals Patient Stated Goal: go to CIR specifically OT Goal Formulation: With patient Time For Goal Achievement: 11/13/20 Potential to Achieve Goals: Good  OT Frequency: Min 2X/week   Barriers to D/C:            Co-evaluation PT/OT/SLP Co-Evaluation/Treatment: Yes Reason for Co-Treatment: For patient/therapist safety;To address functional/ADL transfers PT goals addressed during session: Mobility/safety with mobility;Balance;Proper use of DME;Strengthening/ROM OT goals addressed during session: ADL's and self-care      AM-PAC OT "6 Clicks" Daily Activity     Outcome Measure Help from another person eating meals?: None Help from another person taking care of personal grooming?: A Little Help from another person toileting, which includes  using  toliet, bedpan, or urinal?: Total Help from another person bathing (including washing, rinsing, drying)?: A Lot Help from another person to put on and taking off regular upper body clothing?: A Little Help from another person to put on and taking off regular lower body clothing?: A Lot 6 Click Score: 15   End of Session Equipment Utilized During Treatment: Gait belt;Rolling walker Nurse Communication: Mobility status  Activity Tolerance: Patient tolerated treatment well Patient left: in bed;with call bell/phone within reach;with bed alarm set  OT Visit Diagnosis: Other abnormalities of gait and mobility (R26.89);Muscle weakness (generalized) (M62.81);Pain;History of falling (Z91.81) Pain - part of body:  (back, thighs)                Time: 5366-4403 OT Time Calculation (min): 34 min Charges:  OT General Charges $OT Visit: 1 Visit OT Evaluation $OT Eval Moderate Complexity: 1 Mod  Jolaine Artist, OT Acute Rehabilitation Services Pager [phone] Office [phone]   Delight Stare 10/30/2020, 1:22 PM

## 2020-10-30 NOTE — Evaluation (Signed)
Physical Therapy Evaluation Patient Details Name: Jennifer [name] MRN: [id number] DOB: [birth year] Today's Date: 10/30/2020   History of Present Illness  59 yo female presenting to the ED after a fall and weakness. Has had multiple falls at home, often hitting her head as well as other body parts, and son called EMS after this most recent fall. CTH and CT pelvis negative for acute injury. Admitted with UTI, AKI, weakness. PMH heart failure, LBP, chronic pain, COPD, DM with peripheral neuropathy, fibromyalgia, HLD, HTN, RA, wrist surgery, toe amputations, shoulder arthroscopy, BKA  Clinical Impression   Patient received in bed, cooperative with therapy and motivated to try working on mobility. Able to mobilize OOB on a MinAx2 basis with RW, just very weak- has great carryover of hand placement and sequencing for sit to stand transfers from prior therapy, but once up quickly fatigues. Able to take side steps along EOB with MinAx2 today, then became too fatigued to attempt further gait distance. Politely declines up to chair and states she sleeps in a love seat at home so being in a bed is a treat. Left in bed positioned to comfort with all needs met, bed alarm active and RN aware of patient status. Would likely benefit from intensive therapies in the CIR setting prior to return home.     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), reports hallucinations, frequent falls Restrictions Weight Bearing Restrictions: No      Mobility  Bed Mobility Overal bed mobility: Needs Assistance Bed Mobility: Supine to Sit;Sit to Supine     Supine to sit: Min guard;HOB elevated Sit to supine: Min assist   General bed mobility comments: Min guard  and increased time/effort to get to EOB; needed MinA to return to supine    Transfers Overall transfer level: Needs assistance Equipment used: Rolling walker (2 wheeled) Transfers: Sit to/from Stand Sit to Stand: Min assist;+2 physical assistance         General transfer comment: MinAx2 to boost to upright standing with RW, great awareness of hand placement and sequencing; able to take side steps along side of bed with MinAx2 but had some difficulty following cues as she initially stepped forward instead of sideways  Ambulation/Gait             General Gait Details: deferred- fatigue  Stairs            Wheelchair Mobility    Modified Rankin (Stroke Patients Only)       Balance Overall balance assessment: History of Falls;Needs assistance Sitting-balance support: Feet supported Sitting balance-Leahy Scale: Good Sitting balance - Comments: able to put on socks and manage prosthetic at EOB   Standing balance support: Bilateral upper extremity supported;During functional activity Standing balance-Leahy Scale: Poor Standing balance comment: MinAx2 to maintain balance statically and dynamically with BUE support                             Pertinent Vitals/Pain Pain Assessment: Faces Faces Pain Scale: Hurts little more Pain Location: back and thighs Pain Descriptors / Indicators: Aching;Discomfort;Tingling;Numbness Pain Intervention(s): Limited activity within patient's tolerance;Monitored during session;Repositioned    Home Living Family/patient expects to be discharged to:: Private residence Living Arrangements: Children;Other relatives (lives with  sons, her sister and her sister's husband) Available Help at Discharge: Family;Available 24 hours/day Type of Home: House Home Access: Stairs to enter Entrance Stairs-Rails: Left Entrance Stairs-Number of Steps: 4-5  with L ascending rail Home Layout: One level Home Equipment: Bedside commode;Tub  bench;Shower seat;Wheelchair - [employer] - 4 wheels;Walker - 2 wheels Additional Comments: mostly uses rollator; walks inside the house with rollator and uses WC outside of house. Has had about 13 falls in the past week, her sons come and help her get up when she does fall.    Prior Function Level of Independence: Needs assistance   Gait / Transfers Assistance Needed: independent with rollator despite falls  ADL's / Homemaking Assistance Needed: independent with bathing/dressing, family splits cooking duty  Comments: not on O2 at home; reports she has been hallucinating and is concerned that she might have Parkinsons     Hand Dominance        Extremity/Trunk Assessment   Upper Extremity Assessment Upper Extremity Assessment: Defer to OT evaluation    Lower Extremity Assessment Lower Extremity Assessment: Generalized weakness    Cervical / Trunk Assessment Cervical / Trunk Assessment: Kyphotic  Communication   Communication: No difficulties  Cognition Arousal/Alertness: Awake/alert Behavior During Therapy: WFL for tasks assessed/performed;Flat affect Overall Cognitive Status: Within Functional Limits for tasks assessed                                 General Comments: cooperative and motivated to participate in therapy, does seem to have some general deficits in terms of safety awareness and sequencing      General Comments      Exercises     Assessment/Plan    PT Assessment Patient needs continued PT services  PT Problem List Decreased strength;Obesity;Decreased activity tolerance;Decreased safety awareness;Decreased balance;Decreased knowledge of precautions;Decreased mobility;Decreased coordination;Impaired sensation       PT Treatment Interventions DME instruction;Balance training;Gait training;Stair training;Functional mobility training;Patient/family education;Therapeutic activities;Wheelchair mobility training;Therapeutic exercise    PT  Goals (Current goals can be found in the Care Plan section)  Acute Rehab PT Goals Patient Stated Goal: go to CIR specifically PT Goal Formulation: With patient Time For Goal Achievement: 11/13/20 Potential to Achieve Goals: Fair    Frequency Min 3X/week   Barriers to discharge        Co-evaluation PT/OT/SLP Co-Evaluation/Treatment: Yes Reason for Co-Treatment: For patient/therapist safety;To address functional/ADL transfers;Complexity of the patient's impairments (multi-system involvement) PT goals addressed during session: Mobility/safety with mobility;Balance;Proper use of DME;Strengthening/ROM         AM-PAC PT "6 Clicks" Mobility  Outcome Measure Help needed turning from your back to your side while in a flat bed without using bedrails?: A Little Help needed moving from lying on your back to sitting on the side of a flat bed without using bedrails?: A Little Help needed moving to and from a bed to a chair (including a wheelchair)?: A Lot Help needed standing up from a chair using your arms (e.g., wheelchair or bedside chair)?: A Lot Help needed to walk in hospital room?: A Lot Help needed climbing 3-5 steps with a railing? : Total 6 Click Score: 13    End of Session Equipment Utilized During Treatment: Gait belt;Other (comment) (prosthetic) Activity Tolerance: Patient tolerated treatment well Patient left: in bed;with call bell/phone within reach;with bed alarm set (declined sitting in recliner) Nurse Communication: Mobility status;Precautions PT Visit Diagnosis: Unsteadiness on feet (R26.81);Muscle weakness (generalized) (M62.81);Repeated  falls (R29.6);Difficulty in walking, not elsewhere classified (R26.2)    Time: 4098-1191 PT Time Calculation (min) (ACUTE ONLY): 38 min   Charges:   PT Evaluation $PT Eval Moderate Complexity: 1 Mod PT Treatments $Therapeutic Activity: 8-22 mins        Windell Norfolk, DPT, PN1   Supplemental Physical Therapist [place]     Pager [phone] Acute Rehab Office [phone]

## 2020-10-30 NOTE — Progress Notes (Signed)
Subjective:   Hospital day: 2  Overnight event: No event overnight  Patient is seen at bedside.  She is alert and awake.  Patient still complaining of generalized pain which is chronic for her.  She denies any new pain.  She stated she has been having hallucinations that has been going on for a long time.  Objective:  Vital signs in last 24 hours: Vitals:   10/29/20 1851 10/30/20 0032 10/30/20 0223 10/30/20 0453  BP: 124/62 (!) 137/53 (!) 121/54 (!) 130/56  Pulse:  79 77 76  Resp: 16 18 17 18   Temp: 98.4 F (36.9 C) 99.1 F (37.3 C) 100.2 F (37.9 C) 98.5 F (36.9 C)  TempSrc: Oral Oral Oral Oral  SpO2: 96% 93% 96% 98%  Weight: 118.4 kg     Height: 5\' 7"  (1.702 m)      CBC Latest Ref Rng & Units 10/30/2020 10/29/2020 10/28/2020  WBC 4.0 - 10.5 K/uL 8.5 10.6(H) 11.4(H)  Hemoglobin 12.0 - 15.0 g/dL 10.5(L) 12.1 12.3  Hematocrit 36 - 46 % 32.2(L) 37.5 37.1  Platelets 150 - 400 K/uL 108(L) 109(L) 115(L)   CMP Latest Ref Rng & Units 10/30/2020 10/29/2020 10/28/2020  Glucose 70 - 99 mg/dL 244(H) 267(H) -  BUN 8 - 23 mg/dL 17 17 -  Creatinine 0.44 - 1.00 mg/dL 1.26(H) 1.32(H) 1.23(H)  Sodium 135 - 145 mmol/L 136 136 -  Potassium 3.5 - 5.1 mmol/L 3.7 3.9 -  Chloride 98 - 111 mmol/L 104 103 -  CO2 22 - 32 mmol/L 23 24 -  Calcium 8.9 - 10.3 mg/dL 8.6(L) 8.8(L) -  Total Protein 6.5 - 8.1 g/dL - - -  Total Bilirubin 0.3 - 1.2 mg/dL - - -  Alkaline Phos 38 - 126 U/L - - -  AST 15 - 41 U/L - - -  ALT 0 - 44 U/L - - -    Physical Exam  Physical Exam Constitutional:      Appearance: She is obese. She is not toxic-appearing.  HENT:     Head: Normocephalic.  Eyes:     General:        Right eye: No discharge.        Left eye: No discharge.  Cardiovascular:     Rate and Rhythm: Normal rate and regular rhythm.     Heart sounds: Normal heart sounds.  Pulmonary:     Effort: No respiratory distress.     Breath sounds: Normal breath sounds.  Abdominal:     General: Bowel sounds  are normal.     Palpations: Abdomen is soft.     Tenderness: There is abdominal tenderness (Mild diffuse tenderness to palpation).  Musculoskeletal:     Right lower leg: No edema.  Skin:    General: Skin is warm.  Neurological:     Mental Status: She is alert.  Psychiatric:        Mood and Affect: Mood normal.     Assessment/Plan: Jennifer [name] a 59 year old female with past medical history of type II diabetes, hypertension, chronic diastolic heart failure, left BKA, opioids dependence, CKD 3, nocturnal hypoxemia,who is admitted for generalized weakness secondary to UTI, polypharmacy, and dehydration from poor p.o. intake.  Principal Problem:   Generalized weakness Active Problems:   Diabetes mellitus type 2, controlled, with complications ([place])   Hypertension associated with diabetes ([place])   Chronic diastolic heart failure ([place])   Uncomplicated opioid dependence ([place])   CKD stage 3 due to type 2 diabetes  mellitus ([place])   UTI (urinary tract infection)  Generalized weakness Patient presents the ED after a fall. She has been having multiple falls in the last few weeks due to generalized weakness. She is using a rolling wheelchair and a walker at home but still falling in the bathrooms. Of note, patient is requesting a power wheelchair. Her generalized weakness is likely due to UTI,possible upper respiratory tract infection, polypharmacy anddehydration from poor p.o. intake in the setting of multiple complicated comorbidities.  Low suspicion for CVA or ACS. CT head is negative for any intracranial process. Neuro exam was unremarkable. Troponin was 10 with normal EKG. CT abdomen/pelvis also did not reveal any definitive cause. Hemoglobin at baseline 12.2. TSH and T4 within normal limits. -Reviewing her medication list, patient is on many medications that can cause drowsiness such as oxybutynin, baclofen, hydroxyzine, Percocet, Lyrica and bupropion.  We will work with her PCP,  Dr. Jimmye Norman, to hopefully take off some of her medications that can cause her to be unbalanced.  Patient however refused to have her medications list adjusted. -Ceftriaxone for UTI.   -Monitor vital signs and mental status -PT/OT  Urinary tract infection SIRS criteria 3/4 SIRS criteria, source of infection is likely from UTI even though UA only shows a few bacteria with large leukocyte esterase. Low suspicion but cannot exclude pyelonephritis or bacteremia. Lactate of1.7.    Patient had a T-max of 100.2.  Urine culture only shows <10k of insignificant growth.  Culture shows no growth at 2 days -Continue ceftriaxone for UTI.  Will DC antibiotic tomorrow if patient remains afebrile. -Trend fever -Tylenol for fever or pain  Abdominal pain Diarrhea Differential includes gastroenteritis. Low suspicion for pancreatitis, cholecystitis, appendicitis or C. difficile. CT abdomen pelvis did not show any definitive cause of her abdominal pain. She has a left lateral abdominal wall oblique muscles intramuscular hematoma likely due to trauma.  We will treat supportively.  Patient is eating normally. -Protonix 40 mg daily  Chronic diastolic heart failure Echo 10/16/2020 shows normal EF with normal function LV. Per chart review, patient has not been taking her diuretics due to nocturia and frequency. She is also not taking the HCTZ. Chest x-ray is negative for pleural effusion. Holding diuretics in the setting of AKI. Her dry weight is 117 kg.  She made about 800 cc of urine output yesterday.  Fluid status a +700 cc.  -Hold diuretics -Strict I/O -Daily weights -Monitor O2 status  AKI on CKD 3 Likely prerenal secondary to dehydration from poor p.o. intake.  Baseline creatinine 1.1. Current creatinine 1.34-1.32-1.26.  Holding diuretics.  -Holding diuretics -Encourage p.o. intake -BMP in a.m.  COPD Nocturnal hypoxemia Patient only use oxygen at night for nocturnal hypoxia -Albuterol as  needed -Anoro Ellipta -Oxygen at night  Diabetes Her blood sugars still in the 200s -Increase Lantus  80 units daily -Sliding scale insulin for mealtime  Hypertension Restart amlodipine and benazepril  Opioid dependence Percocet every 8h as needed  Peripheral neuropathy Restart pregabalin  Nocturia Holding oxybutynin   Diet: Thin fluid IVF: LR VTE: Lovenox CODE: Full  Prior to Admission Living Arrangement: Home Anticipated Discharge Location: To be determined Barriers to Discharge:  PT/OT Dispo: Anticipated discharge in approximately 1-2 day(s).   Gaylan Gerold, DO 10/30/2020, 6:21 AM Pager: [phone] After 5pm on weekdays and 1pm on weekends: On Call pager [phone]

## 2020-10-30 NOTE — Progress Notes (Signed)
Dr. Darrick Meigs notified about patient having 4 bursts of SVT with HR in the 150's.

## 2020-10-30 NOTE — Progress Notes (Signed)
Rehab Admissions Coordinator Note:  Per PT and OT recommendation, this patient was screened by Raechel Ache for appropriateness for an Inpatient Acute Rehab Consult. After chart review, it does not appear that this patient has a qualifying diagnosis for CIR; it is unlikely insurance will approve an IP Rehab stay. It is also noted that this patient has a history of falls at home prior to admission. Pt will most likely need an extended rehab and is likely to need more assistance at DC than she currently receives.  At this time, we are recommending [facility].  Raechel Ache 10/30/2020, 2:11 PM  I can be reached at [phone].

## 2020-10-30 NOTE — Plan of Care (Signed)
  Problem: Education: Goal: Knowledge of General Education information will improve Description Including pain rating scale, medication(s)/side effects and non-pharmacologic comfort measures Outcome: Progressing   

## 2020-10-31 ENCOUNTER — Telehealth: Payer: Self-pay | Admitting: Internal Medicine

## 2020-10-31 LAB — CBC
HCT: 33 % — ABNORMAL LOW (ref 36.0–46.0)
Hemoglobin: 10.8 g/dL — ABNORMAL LOW (ref 12.0–15.0)
MCH: 30.7 pg (ref 26.0–34.0)
MCHC: 32.7 g/dL (ref 30.0–36.0)
MCV: 93.8 fL (ref 80.0–100.0)
Platelets: 137 10*3/uL — ABNORMAL LOW (ref 150–400)
RBC: 3.52 MIL/uL — ABNORMAL LOW (ref 3.87–5.11)
RDW: 14 % (ref 11.5–15.5)
WBC: 8.2 10*3/uL (ref 4.0–10.5)
nRBC: 0 % (ref 0.0–0.2)

## 2020-10-31 LAB — BASIC METABOLIC PANEL
Anion gap: 9 (ref 5–15)
BUN: 18 mg/dL (ref 8–23)
CO2: 24 mmol/L (ref 22–32)
Calcium: 8.9 mg/dL (ref 8.9–10.3)
Chloride: 106 mmol/L (ref 98–111)
Creatinine, Ser: 1.18 mg/dL — ABNORMAL HIGH (ref 0.44–1.00)
GFR, Estimated: 53 mL/min — ABNORMAL LOW (ref >60)
Glucose, Bld: 178 mg/dL — ABNORMAL HIGH (ref 70–99)
Potassium: 4.4 mmol/L (ref 3.5–5.1)
Sodium: 139 mmol/L (ref 135–145)

## 2020-10-31 LAB — GLUCOSE, CAPILLARY
Glucose-Capillary: 201 mg/dL — ABNORMAL HIGH (ref 70–99)
Glucose-Capillary: 307 mg/dL — ABNORMAL HIGH (ref 70–99)
Glucose-Capillary: 194 mg/dL — ABNORMAL HIGH (ref 70–99)

## 2020-10-31 MED ORDER — INSULIN ASPART 100 UNIT/ML ~~LOC~~ SOLN: 4.0000 [IU] | Freq: Three times a day (TID) | SUBCUTANEOUS | Status: DC

## 2020-10-31 NOTE — TOC Initial Note (Signed)
Transition of Care [facility]) - Initial/Assessment Note    Patient Details  Name: Jennifer [name] MRN: [id number] Date of Birth: 04-17-1961  Transition of [facility]) CM/SW Contact:    Sable Feil, LCSW Phone Number: 10/31/2020, 11:29 AM  Clinical Narrative:  CSW visited with patient at the bedside to talk with her about her discharge disposition. Jennifer [name] was sitting up in bed and was alert, oriented and open to talking with CSW. Informed patient regarding recommendation for SNF placement for ST rehab. Patient was adamant in her response that she was "not going" to a nursing home and mentioned wanting inpatient rehab. CSW informed patient regarding why they did not feel her insurance would approve her for CIR. Also contacted Claiborne Billings with inpatient rehab and requested that she talk with patient to explain why her insurance would not approve her and she agreed to call patient and explained.     Jennifer [name] reported that she lives with her 2 sons, and her sister and brother-in-law. Patient reported that she uses a walker and rollator at home.       Expected Discharge Plan: Skilled Nursing Facility Barriers to Discharge: Continued Medical Work up   Patient Goals and CMS Choice Patient states their goals for this hospitalization and ongoing recovery are:: Patient's goals are to discharge home once she is ready for discharge CMS Medicare.gov Compare Post Acute Care list provided to:: Other (Comment Required) (List not needed as patient refusing SNF placement) Choice offered to / list presented to : NA  Expected Discharge Plan and Services Expected Discharge Plan: [place] In-house Referral: Clinical Social Work Discharge Planning Services: CM Consult   Living arrangements for the past 2 months: [place]                   DME Agency:  (Patient reported that she has a wheelchair and a rollator)                  Prior Living  Arrangements/Services Living arrangements for the past 2 months: Single Family Home Lives with:: Adult Children, Relatives (Patient reported that she lives with her 2 sons, sister and brother-in-law) Patient language and need for interpreter reviewed:: No Do you feel safe going back to the place where you live?: Yes      Need for Family Participation in Patient Care: Yes (Comment) Care giver support system in place?: Yes (comment)   Criminal Activity/Legal Involvement Pertinent to Current Situation/Hospitalization: No - Comment as needed  Activities of Daily Living      Permission Sought/Granted Permission sought to share information with : Other (comment) (Patient oriented X 4. Don't need to talk with family at tis point) Permission granted to share information with :  (Did not ask to speak wtih family as patient oriented X4)              Emotional Assessment Appearance:: Appears older than stated age Attitude/Demeanor/Rapport: Engaged   Orientation: : Oriented to Self, Oriented to Place, Oriented to  Time, Oriented to Situation Alcohol / Substance Use: Tobacco Use, Alcohol Use, Illicit Drugs (Per H&P, patient smoked half a pack a day and does not drink or use illicit drugs) Psych Involvement: No (comment)  Admission diagnosis:  UTI (urinary tract infection) [N39.0] Acute cystitis without hematuria [N30.00] Patient Active Problem List   Diagnosis Date Noted  . UTI (urinary tract infection) 10/28/2020  . Generalized weakness 10/28/2020  . Diabetic polyneuropathy associated with type 2 diabetes  mellitus ([place]) 04/25/2020  . CKD stage 3 due to type 2 diabetes mellitus ([place]) 10/18/2019  . Urinary incontinence 05/13/2018  . Nocturnal hypoxia 06/12/2017  . Morbid obesity with BMI of 40.0-44.9, adult ([place]) 03/26/2017  . Toe amputation status, right 01/16/2017  . Diabetic retinopathy ([place]) 09/05/2015  . Uncomplicated opioid dependence ([place]) 06/26/2015  . Counseling regarding end  of life decision making 06/14/2015  . Anemia 10/05/2014  . Chronic diastolic heart failure ([place])   . Hx of BKA, left ([place])   . Tobacco abuse   . Severe obesity (BMI >= 40) ([place]) 03/02/2013  . Abnormality of gait 03/01/2013  . Healthcare maintenance 07/10/2012  . Chronic prescription opiate use 12/03/2011  . Peripheral arterial disease with history of revascularization ([place]) 08/27/2011  . Hyperplastic colon polyp 12/2010  . 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 Hypertension associated with diabetes ([place]) 11/29/2009  . Chronic insomnia 10/25/2009  . GASTROESOPHAGEAL REFLUX DISEASE 11/24/2008  . Depression, major, severe recurrence ([place]) 04/06/2008  . Diabetes mellitus type 2, controlled, with complications ([place]) 45/85/9292  . Hyperlipidemia associated with type 2 diabetes mellitus ([place]) 01/08/2007   PCP:  Angelica Pou, MD Pharmacy:   [id number] - [place], [place]  [id number] Phone: [phone] Fax: [phone]    Social Determinants of Health (SDOH) Interventions  No SDOH interventions requested or needed at this time.  Readmission Risk Interventions No flowsheet data found.

## 2020-10-31 NOTE — Progress Notes (Signed)
Inpatient Diabetes Program Recommendations  AACE/ADA: New Consensus Statement on Inpatient Glycemic Control   Target Ranges:  Prepandial:   less than 140 mg/dL      Peak postprandial:   less than 180 mg/dL (1-2 hours)      Critically ill patients:  140 - 180 mg/dL  Results for Jennifer [name], Jennifer [name] (MRN [id number]) as of 10/31/2020 11:36  Ref. Range 10/30/2020 06:42 10/30/2020 11:23 10/30/2020 16:31 10/30/2020 22:04 10/31/2020 06:37 10/31/2020 11:26  Glucose-Capillary Latest Ref Range: 70 - 99 mg/dL 253 (H) 224 (H) 199 (H) 169 (H) 201 (H) 307 (H)    Review of Glycemic Control  Diabetes history: DM2 Outpatient Diabetes medications: Tresiba 70 units daily, Humalog 0-14 units TID with meals, Metformin XR 500 mg daily, Ozempic 0.5 mg Qweek Current orders for Inpatient glycemic control: Lantus 80 units daily, Novolog 0-20 units TID with meals  Inpatient Diabetes Program Recommendations:    Insulin: Please consider ordering Novolog 4 units TID with meals for meal coverage if patient eats at least 50% of meals.  Thanks, Barnie Alderman, RN, MSN, CDE Diabetes Coordinator Inpatient Diabetes Program [phone] (Team Pager from 8am to 5pm)

## 2020-10-31 NOTE — TOC Transition Note (Signed)
Transition of Care [facility]) - CM/SW Discharge Note   Patient Details  Name: Jennifer [name] MRN: [id number] Date of Birth: 1961/09/20  [facility]) CM/SW Contact:  Sharin Mons, RN Phone Number: 10/31/2020, 5:50 PM   Clinical Narrative:    Patient will DC to: home Anticipated DC date: 10/31/2020 Family notified:yes Transport by: car   Per MD patient ready for DC today.  Pt declined SNF recommendations . Agreeable to [facility] services. Choice offered. Pt stated has used [facility] in the past and would like to use them again. Referral made with [facility] and accepted.   RN, and  Patient aware of DC plan. Pt without DME needs or Rx med concerns or affordability.   RNCM will sign off for now as intervention is no longer needed. Please consult [place] again if new   Final next level of care: Home w Home Health Services Barriers to Discharge: No Barriers Identified   Patient Goals and CMS Choice Patient states their goals for this hospitalization and ongoing recovery are:: Patient's goals are to discharge home once she is ready for discharge CMS Medicare.gov Compare Post Acute Care list provided to:: Other (Comment Required) (List not needed as patient refusing SNF placement) Choice offered to / list presented to : NA  Discharge Placement                       Discharge Plan and Services In-house Referral: Clinical Social Work Discharge Planning Services: CM Consult              DME Agency:  (Patient reported that she has a wheelchair and a rollator)       [place] Arranged: Therapist, sports, PT, OT [place] Agency: [place] ([place]) Date HH Agency Contacted: 10/31/20 Time [place]: [place] Representative spoke with at [place]: [place] ([place]) Interventions     Readmission Risk Interventions No flowsheet data found.

## 2020-10-31 NOTE — Progress Notes (Signed)
DISCHARGE NOTE HOME Jennifer [name] to be discharged Home   per MD order. Discussed prescriptions and follow up appointments with the patient. Prescriptions given to patient; medication list explained in detail. Patient verbalized understanding.  Skin clean, dry and intact without evidence of skin break down, no evidence of skin tears noted. IV catheter discontinued intact. Site without signs and symptoms of complications. Dressing and pressure applied. Pt denies pain at the site currently. No complaints noted.  Patient free of lines, drains, and wounds.   An After Visit Summary (AVS) was printed and given to the patient. Patient escorted via wheelchair, and discharged home via private auto.  Berneta Levins, RN

## 2020-10-31 NOTE — Progress Notes (Signed)
Inpatient Rehabilitation-Admissions Coordinator   Received call from LCSW asking for me to contact the patient to explain reason why she does not qualify for IP Rehab at this time, as he had some questions. Contacted pt via phone for explanation. Will update TOC team and therapy team about her wishes not to go to SNF and see what other avenues may be appropriate.   Will not follow in chart. Please call me for questions.   Raechel Ache, OTR/L  Rehab Admissions Coordinator  [phone] 10/31/2020 11:38 AM

## 2020-10-31 NOTE — Discharge Summary (Signed)
Name: Jennifer [name] MRN: [id number] DOB: Nov 27, 1961 59 y.o. PCP: Angelica Pou, MD  Date of Admission: 10/28/2020 11:08 AM Date of Discharge:  Attending Physician: Aldine Contes, MD  Discharge Diagnosis: 1.  Generalized weakness frequent falls 2.  UTI 3.  AKI on CKD 3 4.  Diabetes  Discharge Medications: Allergies as of 10/31/2020      Reactions   Abilify [aripiprazole] Other (See Comments)   Urinary freq Nov 2016   Iohexol Other (See Comments)   IV CONTRAST CAUSED TRANSIENT NEPHROPATHY (KIDNEY INSUFFICIENCY) IN 2007   Ivp Dye [iodinated Diagnostic Agents] Other (See Comments)   IV CONTRAST CAUSED TRANSIENT NEPHROPATHY (KIDNEY INSUFFICIENCY) IN 2007   Morphine Sulfate Itching, Rash      Medication List    TAKE these medications   acetaminophen 325 MG tablet Commonly known as: TYLENOL Take 650 mg by mouth every 6 (six) hours as needed for mild pain or headache.   albuterol 108 (90 Base) MCG/ACT inhaler Commonly known as: ProAir HFA INHALE 2 PUFFS BY MOUTH EVERY 6 HOURS AS NEEDED FOR WHEEZING What changed:   how much to take  how to take this  when to take this  reasons to take this  additional instructions   amLODipine 10 MG tablet Commonly known as: NORVASC Take 1 tablet (10 mg total) by mouth daily.   Anoro Ellipta 62.5-25 MCG/INH Aepb Generic drug: umeclidinium-vilanterol Inhale 1 puff into the lungs daily.   aspirin EC 81 MG tablet Take 1 tablet (81 mg total) by mouth daily.   baclofen 10 MG tablet Commonly known as: LIORESAL TAKE 1 TABLET BY MOUTH THREE TIMES A DAY AS NEEDED FOR MUSCLE SPASMS What changed:   how much to take  how to take this  when to take this  reasons to take this  additional instructions   benazepril 40 MG tablet Commonly known as: LOTENSIN Take 1 tablet (40 mg total) by mouth daily.   Biotin 5 MG Tabs Take 5 mg by mouth daily.   buPROPion 300 MG 24 hr tablet Commonly known as: WELLBUTRIN XL Take  1 tablet (300 mg total) by mouth daily.   cholecalciferol 25 MCG (1000 UNIT) tablet Commonly known as: VITAMIN D3 Take 1 tablet (1,000 Units total) by mouth daily.   hydrochlorothiazide 25 MG tablet Commonly known as: HYDRODIURIL Take 1 tablet (25 mg total) by mouth daily.   hydrOXYzine 25 MG capsule Commonly known as: VISTARIL Take 50 mg by mouth daily.   insulin lispro 100 UNIT/ML KwikPen Commonly known as: HumaLOG KwikPen IF BLOOD SUGAR IS < 175 DO NOT TAKE ANY CORRECTION INSULIN. 176-225 INJECT 2 UNITS. 226-275 INJECT 4 UNITS. 353-299 INJECT 6 UNITS. 326-375 INJECT 8 UNITS. 242-683 INJECT 10 UNITS. 426-475 INJECT 12 UNITS. 419-622 INJECT 14 UNITS AND CALL OFFICE FOR FURTHER INSTRUCTIONS. What changed:   how much to take  how to take this  when to take this  additional instructions   Insulin Pen Needle 32G X 4 MM Misc Use to inject insulin 4 times a day and semaglutide once weekly   Insulin Syringe-Needle U-100 31G X 15/64" 0.3 ML Misc Use with Humalog to correct blood sugar before meals three times a day   metFORMIN 500 MG 24 hr tablet Commonly known as: GLUCOPHAGE-XR 1 tablet daily with breakfast What changed:   how much to take  how to take this  when to take this  additional instructions   multivitamin with minerals Tabs tablet Take 1 tablet by  mouth daily.   nystatin powder Commonly known as: nystatin Apply 1 application topically 3 (three) times daily.   omeprazole 20 MG capsule Commonly known as: PRILOSEC TAKE 1 CAPSULE (20 MG TOTAL) BY MOUTH 2 (TWO) TIMES DAILY BEFORE A MEAL.   onetouch ultrasoft lancets Use as instructed   OneTouch Verio Flex System w/Device Kit Check 4 times a day   OneTouch Verio test strip Generic drug: glucose blood 1 each by Other route as needed for other. Use as instructed. Tests 3-4 times daily.   oxybutynin 5 MG 24 hr tablet Commonly known as: DITROPAN-XL Take 5 mg by mouth daily.   oxyCODONE-acetaminophen  5-325 MG tablet Commonly known as: PERCOCET/ROXICET Take 1 tablet by mouth every 8 (eight) hours as needed. May take a 4th pill if severe pain (up to 10 days monthly) #100 equals 30 day supply What changed:   how much to take  how to take this  when to take this  reasons to take this  additional instructions   Ozempic (0.25 or 0.5 MG/DOSE) 2 MG/1.5ML Sopn Generic drug: Semaglutide(0.25 or 0.5MG/DOS) Inject 0.5 mg into the skin every Monday.   pregabalin 200 MG capsule Commonly known as: LYRICA TAKE 1 CAPSULE BY MOUTH THREE TIMES A DAY What changed:   how much to take  how to take this  when to take this  additional instructions   rosuvastatin 20 MG tablet Commonly known as: CRESTOR Take 1 tablet (20 mg total) by mouth at bedtime.   [name] FlexTouch 200 UNIT/ML FlexTouch Pen Generic drug: insulin degludec Inject 70 Units into the skin daily.   vitamin B-12 500 MCG tablet Commonly known as: CYANOCOBALAMIN Take 500 mcg by mouth daily.   vitamin C 500 MG tablet Commonly known as: ASCORBIC ACID Take 500 mg by mouth daily.       Disposition and follow-up:   [name] was discharged from [facility] in Stable condition.  At the hospital follow up visit please address:  1.   Generalized weakness Likely due to polypharmacy.  Patient is on multiple centrally acting medications that can cause her to be drowsy.  Please help patient adjust or discontinue some of these medications.  2.  Labs / imaging needed at time of follow-up: N/A  3.  Pending labs/ test needing follow-up: N/A  Follow-up Appointments:   Internal medicine clinic with Dr. [facility] Course by problem list: 1.   Generalized weakness Patient presents the ED after a fall. She has been having multiple falls in the last few weeks due to generalized weakness. She is using a rolling wheelchair and a walker at home but still falling in the bathrooms. Of note,  patient is requesting a power wheelchair. Her generalized weakness is likely due to UTI,polypharmacyanddehydration from poor p.o. intake in the setting of multiple complicated comorbidities. Low suspicion for CVA or ACS. CT head is negative for any intracranial process. Neuro exam was unremarkable. Troponin was 10 with normal EKG. CT abdomen/pelvis also did not reveal any definitive cause. Hemoglobin at baseline 12.2. TSH and T4 within normal limits. Reviewing her medication list, patient is on many medications that can cause drowsiness such as oxybutynin, baclofen, hydroxyzine, Percocet, Lyrica and bupropion. We will work with her PCP,Dr. Wyvonnia Lora hopefully take off some of hermedications that can cause her to be unbalanced.Patient however refused to have her medications list adjusted. PT and OT recommended CIR.  However rehab admission coordinator does not think patient will be qualify  for CIR due to a lack of qualifying diagnosis and recommended SNF.  Patient however strongly refused SNF and states that she will either go to inpatient rehab or go home.  After discussion with social worker and with patient, she decided to go home today.  I advised her to follow-up with Dr. Jimmye Norman, her PCP outpatient.  Urinary tract infection SIRS criteria 3/4 SIRS criteria on admission, source of infection is likely from UTI even though UA only shows a few bacteria with large leukocyte esterase.  Lactate of1.7.Urine culture onlyshows<10kof insignificant growth.  Patient has been afebrile in the last 24 hours before discharge.  Blood culture shows no growth at 2 days.  She completed 3-day course of ceftriaxone.   Discharge Vitals:   BP (!) 124/55 (BP Location: Right Arm)   Pulse 78   Temp 98.5 F (36.9 C) (Oral)   Resp 20   Ht 5' 7"  (1.702 m)   Wt 118.4 kg   SpO2 93%   BMI 40.88 kg/m   Pertinent Labs, Studies, and Procedures:  CBC Latest Ref Rng & Units 10/31/2020 10/30/2020 10/29/2020   WBC 4.0 - 10.5 K/uL 8.2 8.5 10.6(H)  Hemoglobin 12.0 - 15.0 g/dL 10.8(L) 10.5(L) 12.1  Hematocrit 36 - 46 % 33.0(L) 32.2(L) 37.5  Platelets 150 - 400 K/uL 137(L) 108(L) 109(L)   CMP Latest Ref Rng & Units 10/31/2020 10/30/2020 10/29/2020  Glucose 70 - 99 mg/dL 178(H) 244(H) 267(H)  BUN 8 - 23 mg/dL 18 17 17   Creatinine 0.44 - 1.00 mg/dL 1.18(H) 1.26(H) 1.32(H)  Sodium 135 - 145 mmol/L 139 136 136  Potassium 3.5 - 5.1 mmol/L 4.4 3.7 3.9  Chloride 98 - 111 mmol/L 106 104 103  CO2 22 - 32 mmol/L 24 23 24   Calcium 8.9 - 10.3 mg/dL 8.9 8.6(L) 8.8(L)  Total Protein 6.5 - 8.1 g/dL - - -  Total Bilirubin 0.3 - 1.2 mg/dL - - -  Alkaline Phos 38 - 126 U/L - - -  AST 15 - 41 U/L - - -  ALT 0 - 44 U/L - - -   CT ABDOMEN PELVIS WO CONTRAST  Result Date: 10/28/2020 CLINICAL DATA:  Right lower quadrant pain, multiple falls today EXAM: CT ABDOMEN AND PELVIS WITHOUT CONTRAST TECHNIQUE: Multidetector CT imaging of the abdomen and pelvis was performed following the standard protocol without IV contrast. COMPARISON:  07/19/2019 FINDINGS: Lower chest: No acute abnormality. Hepatobiliary: Limited without IV contrast. No large focal abnormality. Intrahepatic biliary dilatation. Gallbladder collapsed. Common bile duct nondilated. Pancreas: Unremarkable. No pancreatic ductal dilatation or surrounding inflammatory changes. Spleen: Normal in size without focal abnormality. Adrenals/Urinary Tract: Normal adrenal glands. Kidneys demonstrate nonspecific Peri septal strandy edema without obstruction or acute hydronephrosis. No hydroureter or ureteral calculus. Bladder unremarkable. Right kidney demonstrates an ill-defined hyperdense lesion measuring up to 3 cm in the mid to lower pole area. When correlating with a previous remote ultrasound the patient did have a right renal cyst in this region. Therefore, this may represent a hyperdense cyst with hemorrhage or proteinaceous debris. Stomach/Bowel: Negative for bowel  obstruction, significant dilatation, ileus, or free air. Normal retrocecal appendix. No free fluid, fluid collection, abscess, or ascites. Vascular/Lymphatic: Aorta atherosclerotic. Extensive aortoiliac bifurcation atherosclerosis with common iliac stents noted. Negative for aneurysm. No bulky adenopathy. Reproductive: Remote hysterectomy. Other: Intact abdominal.  No hernia. Musculoskeletal: Left lateral abdominal wall musculature demonstrates asymmetric nodular enlargement, images 23 through 40 series 3. This remains nonspecific but in the setting of trauma may represent a left  lateral abdominal elongated mild intramuscular hematoma. No large retroperitoneal hematoma. No hemoperitoneum. Degenerative changes noted spine. Acute osseous finding. Mild facet arthropathy. Visualized lower ribs appear intact. IMPRESSION: Asymmetric nodular elongated enlargement of the left lateral abdominal wall oblique musculature may represent mild abdominal wall intramuscular hematoma in the setting of trauma. Right kidney lower pole endophytic hyperdense 3 cm lesion appears to correlate with the location of a previous renal cyst by ultrasound. This remains indeterminate but favored to be a hyperdense cyst with protein or hemorrhage. Nonspecific perinephric septal edema without hydronephrosis or obstruction Aortic Atherosclerosis (ICD10-I70.0). Electronically Signed   By: Jerilynn Mages.  Shick M.D.   On: 10/28/2020 14:45   CT Head Wo Contrast  Result Date: 10/28/2020 CLINICAL DATA:  59 y/o female here due to 4 falls yesterday, 3 falls todtay . Pt reports has hit head several times in past several days, c/o right leg pain. Onset today diarrhea. C/o shortness of breath but this is usually normal for patient. EXAM: CT HEAD WITHOUT CONTRAST TECHNIQUE: Contiguous axial images were obtained from the base of the skull through the vertex without intravenous contrast. COMPARISON:  CT head 05/11/2019 FINDINGS: Brain: No evidence of acute infarction,  hemorrhage, hydrocephalus, extra-axial collection or mass lesion/mass effect. Vascular: No hyperdense vessel or unexpected calcification. Skull: Normal. Negative for fracture or focal lesion. Sinuses/Orbits: No acute finding. Other: None. IMPRESSION: No acute intracranial process. Electronically Signed   By: Audie Pinto M.D.   On: 10/28/2020 14:15   DG Chest Port 1 View  Result Date: 10/28/2020 CLINICAL DATA:  Fatigue EXAM: PORTABLE CHEST 1 VIEW COMPARISON:  10/17/2019 chest radiograph. FINDINGS: Stable cardiomediastinal silhouette with top-normal heart size. No pneumothorax. No pleural effusion. Lungs appear clear, with no acute consolidative airspace disease and no pulmonary edema. IMPRESSION: No active disease. Electronically Signed   By: Ilona Sorrel M.D.   On: 10/28/2020 13:51   Discharge Instructions: Discharge Instructions    Call MD for:  persistant nausea and vomiting   Complete by: As directed    Call MD for:  severe uncontrolled pain   Complete by: As directed    Diet - low sodium heart healthy   Complete by: As directed    Discharge instructions   Complete by: As directed    Ms. Springsteen, it is a pleasure of taking care of you during this admission.  You came into the hospital for frequent falls.  This is likely due to dehydration and also you are taking many medications that can make you feel drowsy.  You was also treated for UTI with 3 days of antibiotics.  Please follow-up with Dr. Jimmye Norman, your PCP.  The clinic will contact you for follow-up appointment.  Dr. Jimmye Norman will help adjust some of the medications to prevent you from falling.  Take care  Dr. Alfonse Spruce   Increase activity slowly   Complete by: As directed       Signed: Mosetta Anis, MD 10/31/2020, 3:49 PM   Pager: [phone]

## 2020-10-31 NOTE — Progress Notes (Signed)
Subjective:   Hospital day: 3  Overnight event: No acute event overnight  This morning, patient reports that she is still in significant pain.  She states that these pains are chronic.  She denies any new pain.  She continues to endorse pain with urination. She states that she continues to eat and drink although the food "sucks." She reports that she is "not going to a nursing home" and requests to continue to stay in the hospital despite recommendations. She inquires about what is "going on with her back." She reports significant pain in her lower back and states that she can hardly move.  Objective:  Vital signs in last 24 hours: Vitals:   10/30/20 0900 10/30/20 1635 10/30/20 2107 10/31/20 0535  BP: (!) 123/57 130/62 138/62 (!) 114/55  Pulse: 78 76 81 79  Resp: 18 17 19 19   Temp: 99.1 F (37.3 C) 98.7 F (37.1 C) 99.6 F (37.6 C) 98 F (36.7 C)  TempSrc: Oral Oral  Oral  SpO2: 90% 91% 93% 96%  Weight:      Height:       CBC Latest Ref Rng & Units 10/31/2020 10/30/2020 10/29/2020  WBC 4.0 - 10.5 K/uL 8.2 8.5 10.6(H)  Hemoglobin 12.0 - 15.0 g/dL 10.8(L) 10.5(L) 12.1  Hematocrit 36 - 46 % 33.0(L) 32.2(L) 37.5  Platelets 150 - 400 K/uL 137(L) 108(L) 109(L)   CMP Latest Ref Rng & Units 10/31/2020 10/30/2020 10/29/2020  Glucose 70 - 99 mg/dL 178(H) 244(H) 267(H)  BUN 8 - 23 mg/dL 18 17 17   Creatinine 0.44 - 1.00 mg/dL 1.18(H) 1.26(H) 1.32(H)  Sodium 135 - 145 mmol/L 139 136 136  Potassium 3.5 - 5.1 mmol/L 4.4 3.7 3.9  Chloride 98 - 111 mmol/L 106 104 103  CO2 22 - 32 mmol/L 24 23 24   Calcium 8.9 - 10.3 mg/dL 8.9 8.6(L) 8.8(L)  Total Protein 6.5 - 8.1 g/dL - - -  Total Bilirubin 0.3 - 1.2 mg/dL - - -  Alkaline Phos 38 - 126 U/L - - -  AST 15 - 41 U/L - - -  ALT 0 - 44 U/L - - -    Physical Exam  Physical Exam Constitutional:      Appearance: She is obese. She is not toxic-appearing.  HENT:     Head: Normocephalic.  Eyes:     General:        Right eye: No discharge.         Left eye: No discharge.  Cardiovascular:     Rate and Rhythm: Normal rate and regular rhythm.     Heart sounds: Normal heart sounds.  Pulmonary:     Effort: No respiratory distress.     Breath sounds: Normal breath sounds.  Abdominal:     General: Bowel sounds are normal.     Palpations: Abdomen is soft.     Tenderness: There is abdominal tenderness (Diffuse tenderness to palpation.  No guarding).  Neurological:     Mental Status: She is alert.     Assessment/Plan: Jennifer [name] a 59 year old female with past medical history of type II diabetes, hypertension, chronic diastolic heart failure, left BKA, opioids dependence, CKD 3, nocturnal hypoxemia,who is admitted for generalized weakness secondary to UTI,polypharmacy, and dehydration from poor p.o. intake.  Principal Problem:   Generalized weakness Active Problems:   Diabetes mellitus type 2, controlled, with complications ([name])   Hypertension associated with diabetes ([place])   Chronic diastolic heart failure ([place])   Uncomplicated opioid dependence ([place])  CKD stage 3 due to type 2 diabetes mellitus ([facility])   UTI (urinary tract infection)  Generalized weakness Patient presents the ED after a fall. She has been having multiple falls in the last few weeks due to generalized weakness. She is using a rolling wheelchair and a walker at home but still falling in the bathrooms. Of note, patient is requesting a power wheelchair. Her generalized weakness is likely due to UTI,possible upper respiratory tract infection,polypharmacyanddehydration from poor p.o. intake in the setting of multiple complicated comorbidities.  Low suspicion for CVA or ACS. CT head is negative for any intracranial process. Neuro exam was unremarkable. Troponin was 10 with normal EKG. CT abdomen/pelvis also did not reveal any definitive cause. Hemoglobin at baseline 12.2. TSH and T4 within normal limits. -Reviewing her medication list, patient is  on many medications that can cause drowsiness such as oxybutynin, baclofen, hydroxyzine, Percocet, Lyrica and bupropion. We will work with her PCP,Dr. Wyvonnia Lora hopefully take off some of hermedications that can cause her to be unbalanced.  Patient however refused to have her medications list adjusted. -Monitor vital signs and mental status -PT and OT recommended CIR.  However rehab admission coordinator does not think patient will be qualify for CIR due to a lack of qualifying diagnosis and recommended SNF.  Patient however strongly refused SNF and states that she will either go to inpatient rehab or go home.  After discussion with social worker and with patient, she decided to go home today.  I advised her to follow-up with Dr. Jimmye Norman, her PCP outpatient.   Urinary tract infection SIRS criteria-resolved 3/4 SIRS criteria, source of infection is likely from UTI even though UA only shows a few bacteria with large leukocyte esterase. Low suspicion but cannot exclude pyelonephritis or bacteremia. Lactate of1.7. Urine culture only shows <10k of insignificant growth.    Patient has been afebrile in the last 24 hours.  Culture shows no growth at 2 days -Continue ceftriaxone for UTI today to complete 3 days course of antibiotics -Trend fever -Tylenol for fever or pain   Abdominal pain Differential includes gastroenteritis. Low suspicion for pancreatitis, cholecystitis, appendicitis or C. difficile. CT abdomen pelvis did not show any definitive cause of her abdominal pain. She has a left lateral abdominal wall oblique muscles intramuscular hematoma likely due to trauma.  We will treat supportively.  Patient is eating normally. -Protonix 40 mg daily   Chronic diastolic heart failure-stable Echo 10/16/2020 shows normal EF with normal function LV. Per chart review, patient has not been taking her diuretics due to nocturia and frequency. She is also not taking the HCTZ. Chest x-ray is  negative for pleural effusion. Holding diuretics in the setting of AKI. Her dry weight is 117 kg.  She had a 1500 cc of urine output yesterday.   -Hold diuretics -Strict I/O -Daily weights -Monitor O2 status   AKI on CKD 3 Likely prerenal secondary to dehydration from poor p.o. intake.  Baseline creatinine 1.1.Current creatinine 1.34-1.32-1.26-1.18. Almost at baseline.   -Holding diuretics -Encourage p.o. intake -BMP in a.m.   COPD Nocturnal hypoxemia Patient only use oxygen at night for nocturnal hypoxia -Albuterol as needed -Anoro Ellipta -Oxygen at night   Diabetes -Continue Lantus 80units daily -Add NovoLog 4 units 3 times daily with meals per recommendation from diabetic coordinator.  Appreciate their recommendation. -Sliding scale insulin for mealtime  Hypertension Restart amlodipine and benazepril  Opioid dependence Percocet every8has needed  Peripheral neuropathy Restart pregabalin  Nocturia Holding oxybutynin   Diet:Thin  fluid IVF:LR QNE:TUYWSBB CODE:Full  Prior to Admission Living Arrangement:Home Anticipated Discharge Location:Home Barriers to Discharge:None Dispo: Anticipated discharge in approximatelytoday  Gaylan Gerold, DO 10/31/2020, 6:52 AM Pager: [phone] After 5pm on weekdays and 1pm on weekends: On Call pager [phone]

## 2020-10-31 NOTE — Progress Notes (Signed)
PT Cancellation Note  Patient Details Name: Jennifer [name] MRN: [id number] DOB: 1961/06/20   Cancelled Treatment:    Reason Eval/Treat Not Completed: Patient declined, no reason specified patient adamantly refuses therapy; attempted gentle encouragement but she became more annoyed and yelled "you all don't understand that my back bothers me, I can't walk because of my back!". Unable to convince her to participate. Will attempt to try back if time/schedule allow.    Windell Norfolk, DPT, PN1   Supplemental Physical Therapist [facility]    Pager [phone] Acute Rehab Office [phone]

## 2020-10-31 NOTE — Progress Notes (Deleted)
Name: Jennifer [name] MRN: [id number] DOB: Nov 27, 1961 59 y.o. PCP: Angelica Pou, MD  Date of Admission: 10/28/2020 11:08 AM Date of Discharge:  Attending Physician: Aldine Contes, MD  Discharge Diagnosis: 1.  Generalized weakness frequent falls 2.  UTI 3.  AKI on CKD 3 4.  Diabetes  Discharge Medications: Allergies as of 10/31/2020      Reactions   Abilify [aripiprazole] Other (See Comments)   Urinary freq Nov 2016   Iohexol Other (See Comments)   IV CONTRAST CAUSED TRANSIENT NEPHROPATHY (KIDNEY INSUFFICIENCY) IN 2007   Ivp Dye [iodinated Diagnostic Agents] Other (See Comments)   IV CONTRAST CAUSED TRANSIENT NEPHROPATHY (KIDNEY INSUFFICIENCY) IN 2007   Morphine Sulfate Itching, Rash      Medication List    TAKE these medications   acetaminophen 325 MG tablet Commonly known as: TYLENOL Take 650 mg by mouth every 6 (six) hours as needed for mild pain or headache.   albuterol 108 (90 Base) MCG/ACT inhaler Commonly known as: ProAir HFA INHALE 2 PUFFS BY MOUTH EVERY 6 HOURS AS NEEDED FOR WHEEZING What changed:   how much to take  how to take this  when to take this  reasons to take this  additional instructions   amLODipine 10 MG tablet Commonly known as: NORVASC Take 1 tablet (10 mg total) by mouth daily.   Anoro Ellipta 62.5-25 MCG/INH Aepb Generic drug: umeclidinium-vilanterol Inhale 1 puff into the lungs daily.   aspirin EC 81 MG tablet Take 1 tablet (81 mg total) by mouth daily.   baclofen 10 MG tablet Commonly known as: LIORESAL TAKE 1 TABLET BY MOUTH THREE TIMES A DAY AS NEEDED FOR MUSCLE SPASMS What changed:   how much to take  how to take this  when to take this  reasons to take this  additional instructions   benazepril 40 MG tablet Commonly known as: LOTENSIN Take 1 tablet (40 mg total) by mouth daily.   Biotin 5 MG Tabs Take 5 mg by mouth daily.   buPROPion 300 MG 24 hr tablet Commonly known as: WELLBUTRIN XL Take  1 tablet (300 mg total) by mouth daily.   cholecalciferol 25 MCG (1000 UNIT) tablet Commonly known as: VITAMIN D3 Take 1 tablet (1,000 Units total) by mouth daily.   hydrochlorothiazide 25 MG tablet Commonly known as: HYDRODIURIL Take 1 tablet (25 mg total) by mouth daily.   hydrOXYzine 25 MG capsule Commonly known as: VISTARIL Take 50 mg by mouth daily.   insulin lispro 100 UNIT/ML KwikPen Commonly known as: HumaLOG KwikPen IF BLOOD SUGAR IS < 175 DO NOT TAKE ANY CORRECTION INSULIN. 176-225 INJECT 2 UNITS. 226-275 INJECT 4 UNITS. 353-299 INJECT 6 UNITS. 326-375 INJECT 8 UNITS. 242-683 INJECT 10 UNITS. 426-475 INJECT 12 UNITS. 419-622 INJECT 14 UNITS AND CALL OFFICE FOR FURTHER INSTRUCTIONS. What changed:   how much to take  how to take this  when to take this  additional instructions   Insulin Pen Needle 32G X 4 MM Misc Use to inject insulin 4 times a day and semaglutide once weekly   Insulin Syringe-Needle U-100 31G X 15/64" 0.3 ML Misc Use with Humalog to correct blood sugar before meals three times a day   metFORMIN 500 MG 24 hr tablet Commonly known as: GLUCOPHAGE-XR 1 tablet daily with breakfast What changed:   how much to take  how to take this  when to take this  additional instructions   multivitamin with minerals Tabs tablet Take 1 tablet by  mouth daily.   nystatin powder Commonly known as: nystatin Apply 1 application topically 3 (three) times daily.   omeprazole 20 MG capsule Commonly known as: PRILOSEC TAKE 1 CAPSULE (20 MG TOTAL) BY MOUTH 2 (TWO) TIMES DAILY BEFORE A MEAL.   onetouch ultrasoft lancets Use as instructed   OneTouch Verio Flex System w/Device Kit Check 4 times a day   OneTouch Verio test strip Generic drug: glucose blood 1 each by Other route as needed for other. Use as instructed. Tests 3-4 times daily.   oxybutynin 5 MG 24 hr tablet Commonly known as: DITROPAN-XL Take 5 mg by mouth daily.   oxyCODONE-acetaminophen  5-325 MG tablet Commonly known as: PERCOCET/ROXICET Take 1 tablet by mouth every 8 (eight) hours as needed. May take a 4th pill if severe pain (up to 10 days monthly) #100 equals 30 day supply What changed:   how much to take  how to take this  when to take this  reasons to take this  additional instructions   Ozempic (0.25 or 0.5 MG/DOSE) 2 MG/1.5ML Sopn Generic drug: Semaglutide(0.25 or 0.5MG/DOS) Inject 0.5 mg into the skin every Monday.   pregabalin 200 MG capsule Commonly known as: LYRICA TAKE 1 CAPSULE BY MOUTH THREE TIMES A DAY What changed:   how much to take  how to take this  when to take this  additional instructions   rosuvastatin 20 MG tablet Commonly known as: CRESTOR Take 1 tablet (20 mg total) by mouth at bedtime.   [name] FlexTouch 200 UNIT/ML FlexTouch Pen Generic drug: insulin degludec Inject 70 Units into the skin daily.   vitamin B-12 500 MCG tablet Commonly known as: CYANOCOBALAMIN Take 500 mcg by mouth daily.   vitamin C 500 MG tablet Commonly known as: ASCORBIC ACID Take 500 mg by mouth daily.       Disposition and follow-up:   Ms.Kenlee L Raczkowski was discharged from [facility] in Stable condition.  At the hospital follow up visit please address:  1.   Generalized weakness Likely due to polypharmacy.  Patient is on multiple centrally acting medications that can cause her to be drowsy.  Please help patient adjust or discontinue some of these medications.  2.  Labs / imaging needed at time of follow-up: N/A  3.  Pending labs/ test needing follow-up: N/A  Follow-up Appointments:   Internal medicine clinic with Dr. [facility] Course by problem list: 1.   Generalized weakness Patient presents the ED after a fall. She has been having multiple falls in the last few weeks due to generalized weakness. She is using a rolling wheelchair and a walker at home but still falling in the bathrooms. Of note,  patient is requesting a power wheelchair. Her generalized weakness is likely due to UTI,polypharmacyanddehydration from poor p.o. intake in the setting of multiple complicated comorbidities. Low suspicion for CVA or ACS. CT head is negative for any intracranial process. Neuro exam was unremarkable. Troponin was 10 with normal EKG. CT abdomen/pelvis also did not reveal any definitive cause. Hemoglobin at baseline 12.2. TSH and T4 within normal limits. Reviewing her medication list, patient is on many medications that can cause drowsiness such as oxybutynin, baclofen, hydroxyzine, Percocet, Lyrica and bupropion. We will work with her PCP,Dr. Wyvonnia Lora hopefully take off some of hermedications that can cause her to be unbalanced.Patient however refused to have her medications list adjusted. PT and OT recommended CIR.  However rehab admission coordinator does not think patient will be qualify  for CIR due to a lack of qualifying diagnosis and recommended SNF.  Patient however strongly refused SNF and states that she will either go to inpatient rehab or go home.  After discussion with social worker and with patient, she decided to go home today.  I advised her to follow-up with Dr. Jimmye Norman, her PCP outpatient.  Urinary tract infection SIRS criteria 3/4 SIRS criteria on admission, source of infection is likely from UTI even though UA only shows a few bacteria with large leukocyte esterase.  Lactate of1.7.Urine culture onlyshows<10kof insignificant growth.  Patient has been afebrile in the last 24 hours before discharge.  Blood culture shows no growth at 2 days.  She completed 3-day course of ceftriaxone.   Discharge Vitals:   BP (!) 124/55 (BP Location: Right Arm)   Pulse 78   Temp 98.5 F (36.9 C) (Oral)   Resp 20   Ht 5' 7"  (1.702 m)   Wt 118.4 kg   SpO2 93%   BMI 40.88 kg/m   Pertinent Labs, Studies, and Procedures:  CBC Latest Ref Rng & Units 10/31/2020 10/30/2020 10/29/2020   WBC 4.0 - 10.5 K/uL 8.2 8.5 10.6(H)  Hemoglobin 12.0 - 15.0 g/dL 10.8(L) 10.5(L) 12.1  Hematocrit 36 - 46 % 33.0(L) 32.2(L) 37.5  Platelets 150 - 400 K/uL 137(L) 108(L) 109(L)   CMP Latest Ref Rng & Units 10/31/2020 10/30/2020 10/29/2020  Glucose 70 - 99 mg/dL 178(H) 244(H) 267(H)  BUN 8 - 23 mg/dL 18 17 17   Creatinine 0.44 - 1.00 mg/dL 1.18(H) 1.26(H) 1.32(H)  Sodium 135 - 145 mmol/L 139 136 136  Potassium 3.5 - 5.1 mmol/L 4.4 3.7 3.9  Chloride 98 - 111 mmol/L 106 104 103  CO2 22 - 32 mmol/L 24 23 24   Calcium 8.9 - 10.3 mg/dL 8.9 8.6(L) 8.8(L)  Total Protein 6.5 - 8.1 g/dL - - -  Total Bilirubin 0.3 - 1.2 mg/dL - - -  Alkaline Phos 38 - 126 U/L - - -  AST 15 - 41 U/L - - -  ALT 0 - 44 U/L - - -   CT ABDOMEN PELVIS WO CONTRAST  Result Date: 10/28/2020 CLINICAL DATA:  Right lower quadrant pain, multiple falls today EXAM: CT ABDOMEN AND PELVIS WITHOUT CONTRAST TECHNIQUE: Multidetector CT imaging of the abdomen and pelvis was performed following the standard protocol without IV contrast. COMPARISON:  07/19/2019 FINDINGS: Lower chest: No acute abnormality. Hepatobiliary: Limited without IV contrast. No large focal abnormality. Intrahepatic biliary dilatation. Gallbladder collapsed. Common bile duct nondilated. Pancreas: Unremarkable. No pancreatic ductal dilatation or surrounding inflammatory changes. Spleen: Normal in size without focal abnormality. Adrenals/Urinary Tract: Normal adrenal glands. Kidneys demonstrate nonspecific Peri septal strandy edema without obstruction or acute hydronephrosis. No hydroureter or ureteral calculus. Bladder unremarkable. Right kidney demonstrates an ill-defined hyperdense lesion measuring up to 3 cm in the mid to lower pole area. When correlating with a previous remote ultrasound the patient did have a right renal cyst in this region. Therefore, this may represent a hyperdense cyst with hemorrhage or proteinaceous debris. Stomach/Bowel: Negative for bowel  obstruction, significant dilatation, ileus, or free air. Normal retrocecal appendix. No free fluid, fluid collection, abscess, or ascites. Vascular/Lymphatic: Aorta atherosclerotic. Extensive aortoiliac bifurcation atherosclerosis with common iliac stents noted. Negative for aneurysm. No bulky adenopathy. Reproductive: Remote hysterectomy. Other: Intact abdominal.  No hernia. Musculoskeletal: Left lateral abdominal wall musculature demonstrates asymmetric nodular enlargement, images 23 through 40 series 3. This remains nonspecific but in the setting of trauma may represent a left  lateral abdominal elongated mild intramuscular hematoma. No large retroperitoneal hematoma. No hemoperitoneum. Degenerative changes noted spine. Acute osseous finding. Mild facet arthropathy. Visualized lower ribs appear intact. IMPRESSION: Asymmetric nodular elongated enlargement of the left lateral abdominal wall oblique musculature may represent mild abdominal wall intramuscular hematoma in the setting of trauma. Right kidney lower pole endophytic hyperdense 3 cm lesion appears to correlate with the location of a previous renal cyst by ultrasound. This remains indeterminate but favored to be a hyperdense cyst with protein or hemorrhage. Nonspecific perinephric septal edema without hydronephrosis or obstruction Aortic Atherosclerosis (ICD10-I70.0). Electronically Signed   By: Jerilynn Mages.  Shick M.D.   On: 10/28/2020 14:45   CT Head Wo Contrast  Result Date: 10/28/2020 CLINICAL DATA:  59 y/o female here due to 4 falls yesterday, 3 falls todtay . Pt reports has hit head several times in past several days, c/o right leg pain. Onset today diarrhea. C/o shortness of breath but this is usually normal for patient. EXAM: CT HEAD WITHOUT CONTRAST TECHNIQUE: Contiguous axial images were obtained from the base of the skull through the vertex without intravenous contrast. COMPARISON:  CT head 05/11/2019 FINDINGS: Brain: No evidence of acute infarction,  hemorrhage, hydrocephalus, extra-axial collection or mass lesion/mass effect. Vascular: No hyperdense vessel or unexpected calcification. Skull: Normal. Negative for fracture or focal lesion. Sinuses/Orbits: No acute finding. Other: None. IMPRESSION: No acute intracranial process. Electronically Signed   By: Audie Pinto M.D.   On: 10/28/2020 14:15   DG Chest Port 1 View  Result Date: 10/28/2020 CLINICAL DATA:  Fatigue EXAM: PORTABLE CHEST 1 VIEW COMPARISON:  10/17/2019 chest radiograph. FINDINGS: Stable cardiomediastinal silhouette with top-normal heart size. No pneumothorax. No pleural effusion. Lungs appear clear, with no acute consolidative airspace disease and no pulmonary edema. IMPRESSION: No active disease. Electronically Signed   By: Ilona Sorrel M.D.   On: 10/28/2020 13:51   Discharge Instructions: Discharge Instructions    Call MD for:  persistant nausea and vomiting   Complete by: As directed    Call MD for:  severe uncontrolled pain   Complete by: As directed    Diet - low sodium heart healthy   Complete by: As directed    Discharge instructions   Complete by: As directed    Ms. Storti, it is a pleasure of taking care of you during this admission.  You came into the hospital for frequent falls.  This is likely due to dehydration and also you are taking many medications that can make you feel drowsy.  You was also treated for UTI with 3 days of antibiotics.  Please follow-up with Dr. Jimmye Norman, your PCP.  The clinic will contact you for follow-up appointment.  Dr. Jimmye Norman will help adjust some of the medications to prevent you from falling.  Take care  Dr. Alfonse Spruce   Increase activity slowly   Complete by: As directed       Signed: Gaylan Gerold, DO 10/31/2020, 2:00 PM   Pager: [phone]

## 2020-10-31 NOTE — Telephone Encounter (Signed)
TOC HFU appointment 11/08/2020 at 1:45 pm with Dr. Charleen Kirks.  Per Epic patient is still on inpatient service.  Appointment card has been mailed.

## 2020-10-31 NOTE — Telephone Encounter (Signed)
-----   Message from Gaylan Gerold, DO sent at 10/31/2020  1:53 PM EST ----- Hello,  Please help this patient making a hospital follow up appointment. She may refuse the appointment.  Thank you,  Gaylan Gerold DO

## 2020-11-02 LAB — CULTURE, BLOOD (ROUTINE X 2)
Culture: NO GROWTH
Culture: NO GROWTH
Special Requests: ADEQUATE

## 2020-11-03 IMAGING — MG DIGITAL SCREENING BILAT W/ TOMO W/ CAD
6 of 10 series · 6 of 30 positions shown · non-contrast
Comparison: Previous exam(s).

CLINICAL DATA: Screening.

EXAM:
DIGITAL SCREENING BILATERAL MAMMOGRAM WITH TOMO AND CAD

[L CC synth-2D (1 of 2)]
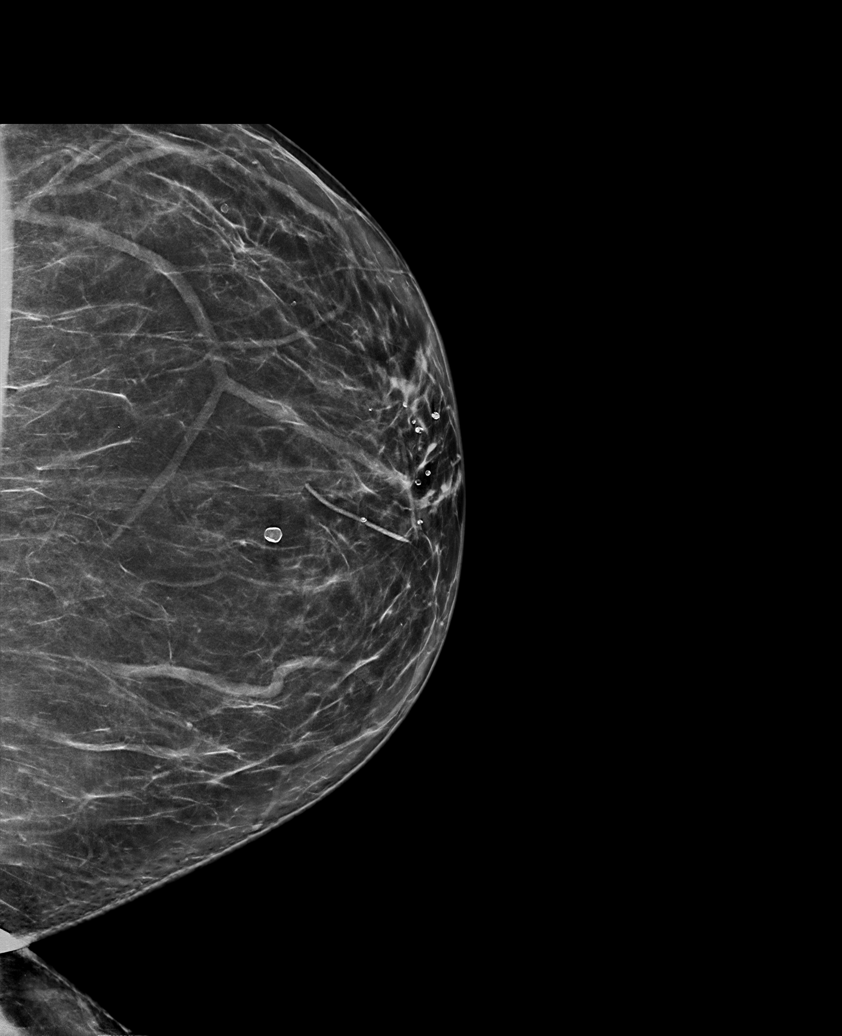

[L MLO synth-2D]
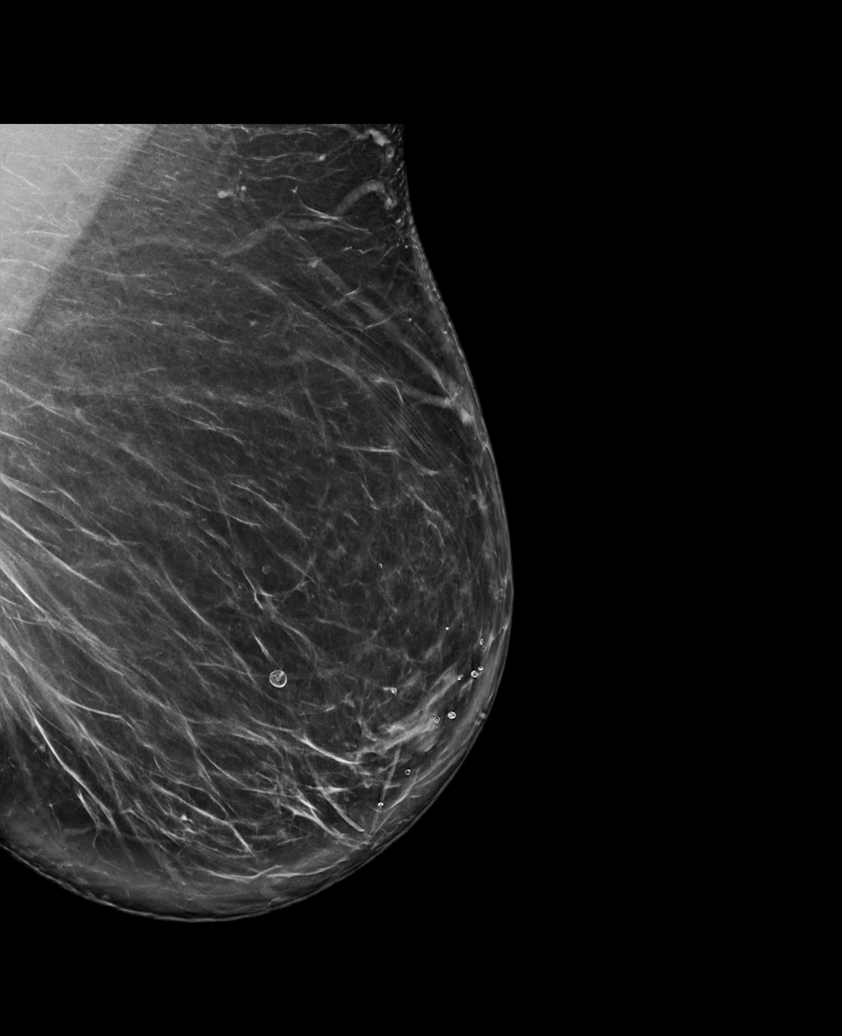

[R CC synth-2D]
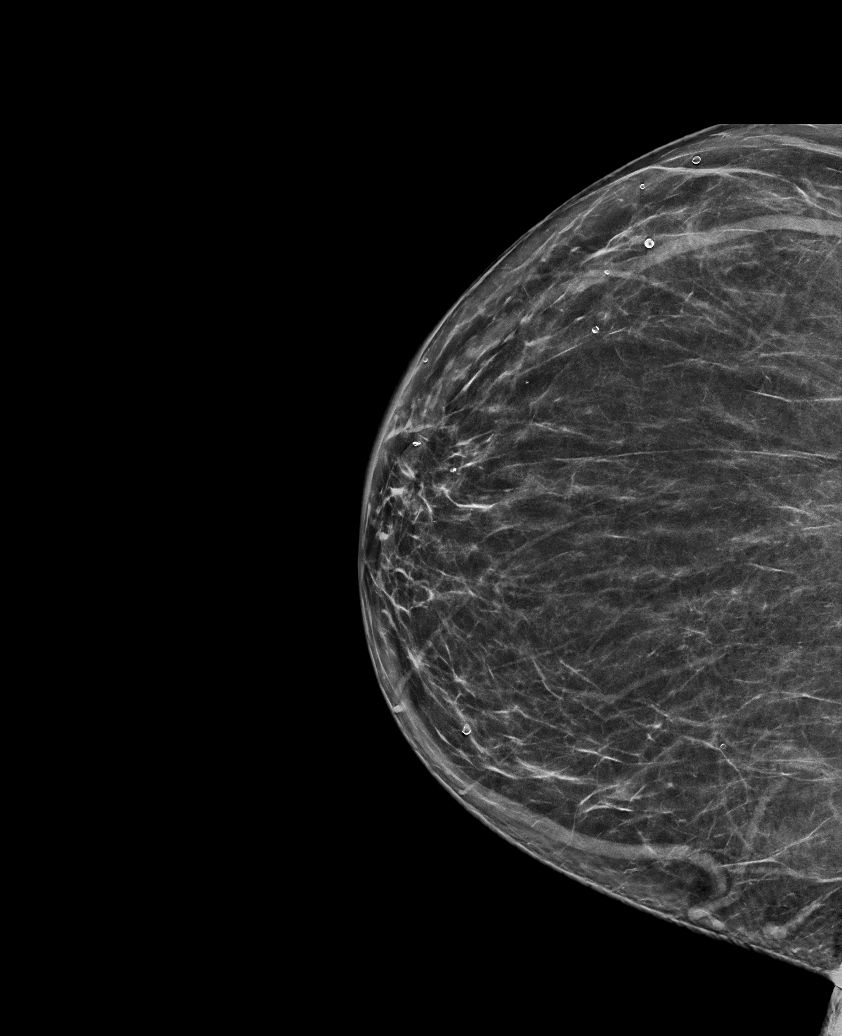

[L CC synth-2D (2 of 2)]
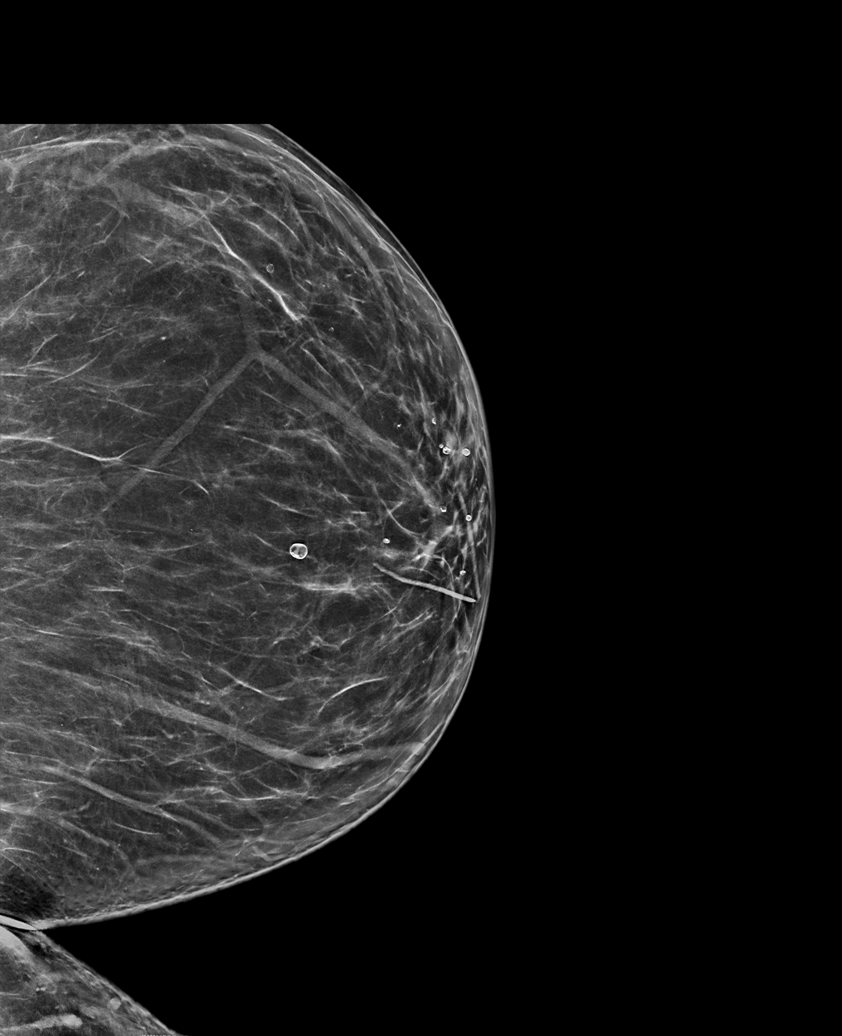

[R MLO synth-2D]
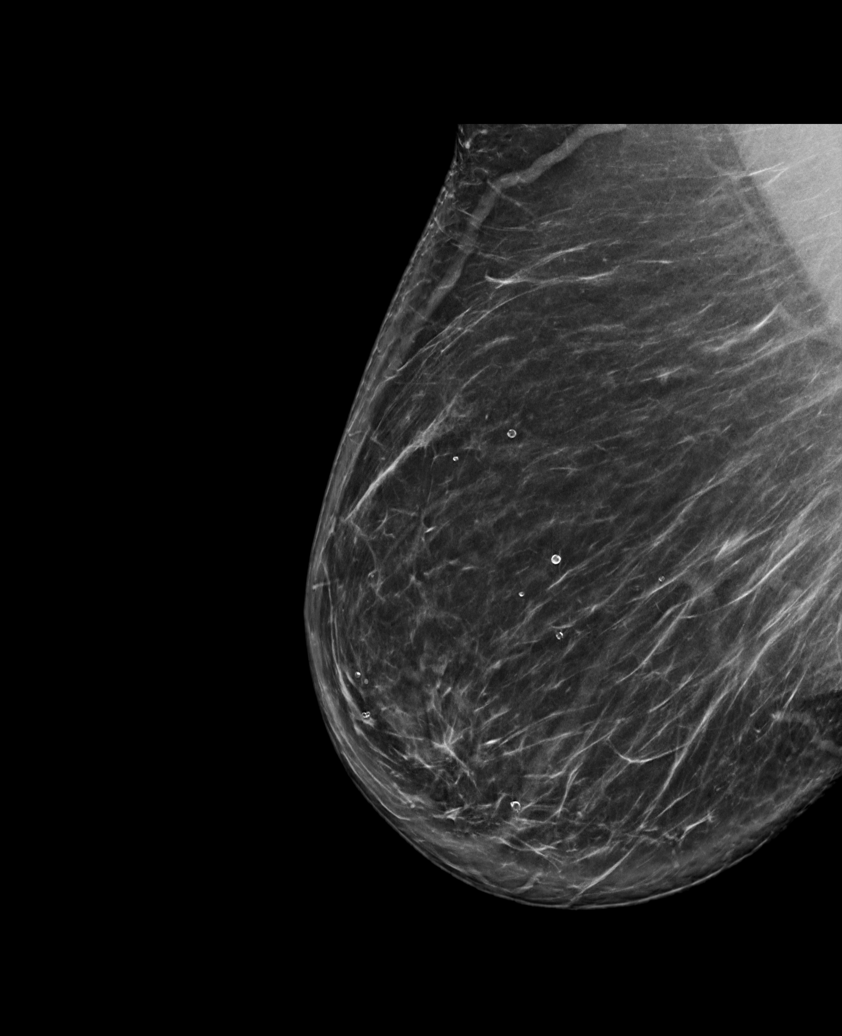

[L CC tomo · tomo slice 40/79.0]
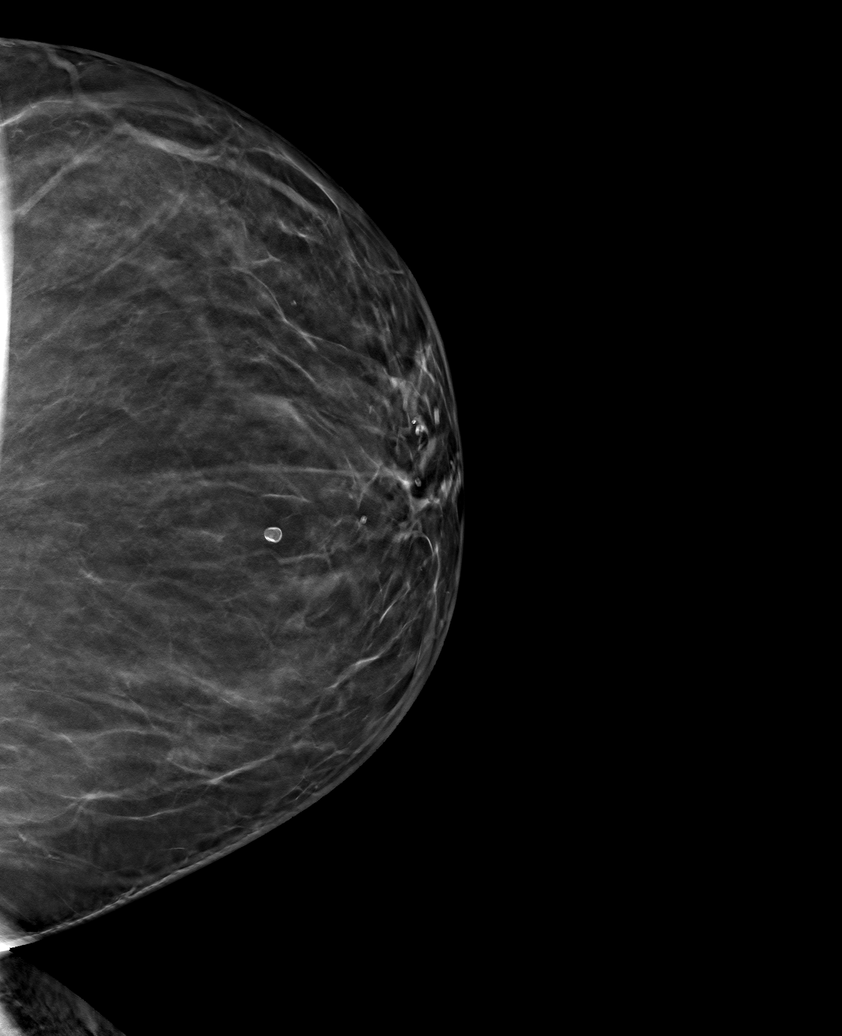

[6 of 30 positions shown; findings below may reference images not displayed]

ACR Breast Density Category b: There are scattered areas of
fibroglandular density.
FINDINGS: There are no findings suspicious for malignancy. Images were
processed with CAD.
IMPRESSION: No mammographic evidence of malignancy. A result letter of this
screening mammogram will be mailed directly to the patient.

RECOMMENDATION:
Screening mammogram in one year. (Code:[id number])

BI-RADS CATEGORY  1: Negative.

## 2020-11-05 ENCOUNTER — Other Ambulatory Visit: Payer: Self-pay

## 2020-11-05 NOTE — Telephone Encounter (Signed)
Need refill on buPROPion (WELLBUTRIN XL) 300 MG 24 hr tablet hydrOXYzine (VISTARIL) 25 MG capsule ;pt contact [facility] would like a call back regarding UTI [phone]

## 2020-11-06 ENCOUNTER — Other Ambulatory Visit: Payer: Self-pay | Admitting: *Deleted

## 2020-11-06 ENCOUNTER — Telehealth: Payer: Self-pay | Admitting: *Deleted

## 2020-11-06 MED ORDER — BUPROPION HCL ER (XL) 300 MG PO TB24
300.0000 mg | ORAL_TABLET | Freq: Every day | ORAL | 1 refills | Status: DC
Start: 2020-11-06 — End: 2021-01-22

## 2020-11-06 MED ORDER — HYDROXYZINE PAMOATE 25 MG PO CAPS
50.0000 mg | ORAL_CAPSULE | Freq: Every day | ORAL | 1 refills | Status: DC
Start: 2020-11-06 — End: 2021-02-15

## 2020-11-06 MED ORDER — OXYCODONE-ACETAMINOPHEN 5-325 MG PO TABS: ORAL_TABLET | ORAL | 0 refills | Status: DC

## 2020-11-06 NOTE — Progress Notes (Signed)
CC: dysuria, flank pain  HPI:  [name] is a 59 y.o. female with history as below presenting for hospital follow-up and complaining of the above. Please refer to problem based charting for further details of assessment and plan of current problem and chronic medical conditions.  We are meeting her today for increasing dysuria, flank pain, and subjective fever.  She was recently discharged to home, though it was highly recommended that she go to SNF.  Per PT documentation, she is able to walk only 2 steps from her bed.  She states that at home she usually stays in a love seat and sleeps on it as well.  She is able to get up to the kitchen around the house, but is unable to ambulate afterwards.  She states he has had 3 very bad experiences at SNF previously, and is very resistant to considering it.  Past Medical History:  Diagnosis Date  . Cataract   . Chronic bronchitis ([place])    "I get it alot" (09/28/2013)  . Chronic diastolic heart failure (HCC)    grade 2 per 2D echocardiogram (01/2013)  . Chronic lower back pain   . Chronic pain syndrome 12/03/2011   Likely secondary to depression, "fibromyalgia", neuropathy, and obesity. Lumbar MRI 2014 no sig change from prior (2008) : Stable hypertrophic facet disease most notable at L4-5. Stable shallow left foraminal/extraforaminal disc protrusion at L4-5. No direct neural compression.      Marland Kitchen COPD 01/08/2007   PFT's 05/2007 : FEV1/FVC 82, FEV1 64% pred, FEF 25-75% 40% predicted, 16% improvement in FEV1 with bronchodilators.     . Diabetic peripheral neuropathy ([place])   . DVT of upper extremity (deep vein thrombosis) ([place]) 03/11/2013   Secondary to PICC line. Right brachial vein, diagnosed on 03/10/2013 Coumadin for 3 months. End date 06/10/2013   . Environmental allergies    Hx: of  . Fatty liver 2003   observed on ultrasound abdomen  . Fibromyalgia   . GERD (gastroesophageal reflux disease)   . Glaucoma   . History of bacterial  endocarditis 2014   Endocarditis involving mitral and tricuspid valves.  S. Aureus and GBS.   Marland Kitchen History of use of hearing aid   . Hyperlipidemia   . Hyperplastic colon polyp 12/2010   Per colonoscopy (12/2010) - Dr. Deatra Ina  . Hypertension   . 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 PVD (peripheral vascular disease) with claudication ([place])    Stents to bilateral common iliac arteries (left 2005, right 2008), on chronic plavix  . S/P BKA (below knee amputation) unilateral ([place])    2014 L - failed limb preserving treatment. 2/2 tobacco use, DM, and cont weight bearing on surgical wound and developed gangrene   . Tobacco abuse   . Type II diabetes mellitus with peripheral circulatory disorders, uncontrolled DX: 1993   Insulin dep. Poor control. Complicated by diabetic foot ulcer and diabetic eye disease.     Review of Systems:   Review of Systems  Constitutional: Positive for fever. Negative for chills.  Respiratory: Positive for cough (Baseline), sputum production (Baseline) and shortness of breath (Baseline).   Cardiovascular: Positive for leg swelling. Negative for chest pain.  Gastrointestinal: Positive for abdominal pain (Suprapubic) and nausea. Negative for diarrhea and vomiting.  Genitourinary: Positive for dysuria and flank pain. Negative for frequency.  Musculoskeletal: Negative for falls.  Neurological: Positive for dizziness and weakness. Negative for loss of consciousness.  All other systems reviewed and are negative.    Physical Exam: Vitals:   11/07/20 1424  BP: (!) 135/53  Pulse: 69  Temp: 98.2 F (36.8 C)  TempSrc: Oral  SpO2: 99%  Weight: 259 lb 3.2 oz (117.6 kg)  Height: 5\' 7"  (1.702 m)   Constitutional: no acute distress, diaphoretic Head: atraumatic ENT: external ears normal Cardiovascular: regular rate and rhythm, normal heart sounds Pulmonary: effort normal, normal breath sounds  bilaterally Abdominal: flat, nontender, no rebound tenderness, bowel sounds normal Genitourinary: Left CVA tenderness Musculoskeletal: Left lower extremity amputation and right fifth digit amputation Skin: warm and diaphoretic Neurological: alert, no focal deficit Psychiatric: normal mood and affect  Assessment & Plan:   See Encounters Tab for problem based charting.  Patient seen with Dr. Evette Doffing

## 2020-11-06 NOTE — Telephone Encounter (Signed)
oxyCODONE-acetaminophen (PERCOCET/ROXICET) 5-325 MG tablet, refill request @  [id number] - [place], [place]. Phone:  [phone]  Fax:  [phone]     Pt states she is in pain, requesting to speak with a nurse. Please call pt back.

## 2020-11-06 NOTE — Telephone Encounter (Signed)
Transition Care Management Follow-up Telephone Call   Date discharged?"last week"   How have you been since you were released from the hospital?"im having this pain, worse than when they put me in the hospital, I need my pain medicine"   Do you understand why you were in the hospital? Yeah but im in bad pain, im not as sick just sick with pain"   Do you understand the discharge instructions? "yes, I need my medicines though, i'll come inthere tomorrow but I need my medicine now"   Where were you discharged to? "they wanted to send me to one of those old people places but im not old, im young and im not going"   Items Reviewed:  Medications reviewed: yes  Allergies reviewed: yes  Dietary changes reviewed: no, ask if pt is drinking more water and not soda, "yes, im leaving a lot f my soda off"  Referrals reviewed: yes   Functional Questionnaire:   Activities of Daily Living (ADLs):   She states they are independent in the following: "as much as I can be but I m not going to a old folks home" States they require assistance with the following: "my people will help me"   Any transportation issues/concerns?: "I get there"   Any patient concerns?"i just want my medicine"   Confirmed importance and date/time of follow-up visits scheduled yes  Provider Appointment booked with 11/07/2020 dr Bridgett Larsson  Confirmed with patient if condition begins to worsen call PCP or go to the ER.  Patient was given the office number and encouraged to call back with question or concerns.  "I know all that im not going to the emergency room if I get my medicine, ill be down there in the clinic"

## 2020-11-06 NOTE — Telephone Encounter (Signed)
Pt calls and states she has increased her po intake of water, states every time she goes to void she has great abd pain. States she has not hurt like this in a long while. She does not have transportation today but will be here for appt tomorrow. States she cannot wait for pain med til tomorrow her pain is 10+.

## 2020-11-06 NOTE — Telephone Encounter (Signed)
Pt requesting to speak with a nurse about pain, please call pt back.

## 2020-11-07 ENCOUNTER — Other Ambulatory Visit: Payer: Self-pay

## 2020-11-07 ENCOUNTER — Observation Stay (HOSPITAL_COMMUNITY)
Admission: AD | Admit: 2020-11-07 | Discharge: 2020-11-08 | Disposition: A | Discharge status: Home / Self Care | Payer: Medicare Other | Attending: Internal Medicine | Admitting: Internal Medicine

## 2020-11-07 ENCOUNTER — Encounter: Payer: Self-pay | Admitting: Student

## 2020-11-07 ENCOUNTER — Observation Stay (HOSPITAL_COMMUNITY): Payer: Medicare Other

## 2020-11-07 ENCOUNTER — Ambulatory Visit (INDEPENDENT_AMBULATORY_CARE_PROVIDER_SITE_OTHER): Payer: Medicare Other | Admitting: Student

## 2020-11-07 VITALS — BP 148/79 | HR 64 | Temp 98.2°F | Ht 67.0 in | Wt 259.2 lb

## 2020-11-07 DIAGNOSIS — N12 Tubulo-interstitial nephritis, not specified as acute or chronic: Secondary | ICD-10-CM

## 2020-11-07 DIAGNOSIS — I10 Essential (primary) hypertension: Secondary | ICD-10-CM | POA: Diagnosis present

## 2020-11-07 DIAGNOSIS — E1122 Type 2 diabetes mellitus with diabetic chronic kidney disease: Secondary | ICD-10-CM | POA: Diagnosis present

## 2020-11-07 DIAGNOSIS — F17210 Nicotine dependence, cigarettes, uncomplicated: Secondary | ICD-10-CM | POA: Diagnosis not present

## 2020-11-07 DIAGNOSIS — Z79899 Other long term (current) drug therapy: Secondary | ICD-10-CM | POA: Insufficient documentation

## 2020-11-07 DIAGNOSIS — R109 Unspecified abdominal pain: Principal | ICD-10-CM | POA: Diagnosis present

## 2020-11-07 DIAGNOSIS — E669 Obesity, unspecified: Secondary | ICD-10-CM | POA: Diagnosis present

## 2020-11-07 DIAGNOSIS — R531 Weakness: Secondary | ICD-10-CM

## 2020-11-07 DIAGNOSIS — R300 Dysuria: Secondary | ICD-10-CM | POA: Diagnosis present

## 2020-11-07 DIAGNOSIS — E119 Type 2 diabetes mellitus without complications: Secondary | ICD-10-CM | POA: Diagnosis not present

## 2020-11-07 DIAGNOSIS — I5032 Chronic diastolic (congestive) heart failure: Secondary | ICD-10-CM | POA: Diagnosis not present

## 2020-11-07 DIAGNOSIS — E118 Type 2 diabetes mellitus with unspecified complications: Secondary | ICD-10-CM | POA: Diagnosis present

## 2020-11-07 DIAGNOSIS — I110 Hypertensive heart disease with heart failure: Secondary | ICD-10-CM | POA: Insufficient documentation

## 2020-11-07 DIAGNOSIS — J449 Chronic obstructive pulmonary disease, unspecified: Secondary | ICD-10-CM | POA: Insufficient documentation

## 2020-11-07 DIAGNOSIS — N182 Chronic kidney disease, stage 2 (mild): Secondary | ICD-10-CM | POA: Diagnosis present

## 2020-11-07 DIAGNOSIS — I152 Hypertension secondary to endocrine disorders: Secondary | ICD-10-CM | POA: Diagnosis present

## 2020-11-07 DIAGNOSIS — J3489 Other specified disorders of nose and nasal sinuses: Secondary | ICD-10-CM | POA: Diagnosis not present

## 2020-11-07 DIAGNOSIS — N183 Chronic kidney disease, stage 3 unspecified: Secondary | ICD-10-CM | POA: Diagnosis present

## 2020-11-07 DIAGNOSIS — Z7982 Long term (current) use of aspirin: Secondary | ICD-10-CM | POA: Diagnosis not present

## 2020-11-07 DIAGNOSIS — Z794 Long term (current) use of insulin: Secondary | ICD-10-CM | POA: Diagnosis not present

## 2020-11-07 DIAGNOSIS — E1159 Type 2 diabetes mellitus with other circulatory complications: Secondary | ICD-10-CM | POA: Diagnosis present

## 2020-11-07 DIAGNOSIS — Z7984 Long term (current) use of oral hypoglycemic drugs: Secondary | ICD-10-CM | POA: Insufficient documentation

## 2020-11-07 DIAGNOSIS — E11621 Type 2 diabetes mellitus with foot ulcer: Secondary | ICD-10-CM | POA: Diagnosis present

## 2020-11-07 DIAGNOSIS — E1165 Type 2 diabetes mellitus with hyperglycemia: Secondary | ICD-10-CM | POA: Diagnosis present

## 2020-11-07 LAB — CBC WITH DIFFERENTIAL/PLATELET
Abs Immature Granulocytes: 0.03 10*3/uL (ref 0.00–0.07)
Basophils Absolute: 0.1 10*3/uL (ref 0.0–0.1)
Basophils Relative: 1 %
Eosinophils Absolute: 0.2 10*3/uL (ref 0.0–0.5)
Eosinophils Relative: 2 %
HCT: 37.1 % (ref 36.0–46.0)
Hemoglobin: 11.8 g/dL — ABNORMAL LOW (ref 12.0–15.0)
Immature Granulocytes: 0 %
Lymphocytes Relative: 52 %
Lymphs Abs: 5.5 10*3/uL — ABNORMAL HIGH (ref 0.7–4.0)
MCH: 31.4 pg (ref 26.0–34.0)
MCHC: 31.8 g/dL (ref 30.0–36.0)
MCV: 98.7 fL (ref 80.0–100.0)
Monocytes Absolute: 0.5 10*3/uL (ref 0.1–1.0)
Monocytes Relative: 5 %
Neutro Abs: 4.2 10*3/uL (ref 1.7–7.7)
Neutrophils Relative %: 40 %
Platelets: 298 10*3/uL (ref 150–400)
RBC: 3.76 MIL/uL — ABNORMAL LOW (ref 3.87–5.11)
RDW: 15.4 % (ref 11.5–15.5)
WBC: 10.4 10*3/uL (ref 4.0–10.5)
nRBC: 0 % (ref 0.0–0.2)

## 2020-11-07 LAB — COMPREHENSIVE METABOLIC PANEL
ALT: 28 U/L (ref 0–44)
AST: 20 U/L (ref 15–41)
Albumin: 3.1 g/dL — ABNORMAL LOW (ref 3.5–5.0)
Alkaline Phosphatase: 80 U/L (ref 38–126)
Anion gap: 8 (ref 5–15)
BUN: 15 mg/dL (ref 8–23)
CO2: 27 mmol/L (ref 22–32)
Calcium: 9.2 mg/dL (ref 8.9–10.3)
Chloride: 107 mmol/L (ref 98–111)
Creatinine, Ser: 1.11 mg/dL — ABNORMAL HIGH (ref 0.44–1.00)
GFR, Estimated: 57 mL/min — ABNORMAL LOW (ref >60)
Glucose, Bld: 82 mg/dL (ref 70–99)
Potassium: 4.1 mmol/L (ref 3.5–5.1)
Sodium: 142 mmol/L (ref 135–145)
Total Bilirubin: 0.2 mg/dL — ABNORMAL LOW (ref 0.3–1.2)
Total Protein: 6.7 g/dL (ref 6.5–8.1)

## 2020-11-07 LAB — URINALYSIS, ROUTINE W REFLEX MICROSCOPIC
Bacteria, UA: NONE SEEN
Bilirubin Urine: NEGATIVE
Glucose, UA: >=500 mg/dL — AB
Hgb urine dipstick: NEGATIVE
Ketones, ur: NEGATIVE mg/dL
Nitrite: NEGATIVE
Protein, ur: NEGATIVE mg/dL
Specific Gravity, Urine: 1.012 (ref 1.005–1.030)
pH: 5 (ref 5.0–8.0)

## 2020-11-07 LAB — LACTIC ACID, PLASMA: Lactic Acid, Venous: 1.5 mmol/L (ref 0.5–1.9)

## 2020-11-07 MED ORDER — HYDROCHLOROTHIAZIDE 25 MG PO TABS
25.0000 mg | ORAL_TABLET | Freq: Every day | ORAL | Status: DC
  Administered 2020-11-08: 25 mg via ORAL
  Filled 2020-11-07: qty 1

## 2020-11-07 MED ORDER — PREGABALIN 100 MG PO CAPS
200.0000 mg | ORAL_CAPSULE | Freq: Three times a day (TID) | ORAL | Status: DC
  Administered 2020-11-07 – 2020-11-08 (×3): 200 mg via ORAL
  Filled 2020-11-07 (×3): qty 2

## 2020-11-07 MED ORDER — BENAZEPRIL HCL 40 MG PO TABS
40.0000 mg | ORAL_TABLET | Freq: Every day | ORAL | Status: DC
  Administered 2020-11-08: 40 mg via ORAL
  Filled 2020-11-07 (×2): qty 1

## 2020-11-07 MED ORDER — AMLODIPINE BESYLATE 10 MG PO TABS
10.0000 mg | ORAL_TABLET | Freq: Every day | ORAL | Status: DC
  Administered 2020-11-08: 10 mg via ORAL
  Filled 2020-11-07: qty 1

## 2020-11-07 MED ORDER — PANTOPRAZOLE SODIUM 40 MG PO TBEC
40.0000 mg | DELAYED_RELEASE_TABLET | Freq: Every day | ORAL | Status: DC
  Administered 2020-11-08: 40 mg via ORAL
  Filled 2020-11-07: qty 1

## 2020-11-07 MED ORDER — ONDANSETRON HCL 4 MG/2ML IJ SOLN
4.0000 mg | Freq: Once | INTRAMUSCULAR | Status: AC
  Administered 2020-11-07: 4 mg via INTRAVENOUS
  Filled 2020-11-07: qty 2

## 2020-11-07 MED ORDER — ACETAMINOPHEN 650 MG RE SUPP: 650.0000 mg | Freq: Four times a day (QID) | RECTAL | Status: DC | PRN

## 2020-11-07 MED ORDER — BACLOFEN 10 MG PO TABS
10.0000 mg | ORAL_TABLET | Freq: Two times a day (BID) | ORAL | Status: DC
  Administered 2020-11-07 – 2020-11-08 (×2): 10 mg via ORAL
  Filled 2020-11-07 (×2): qty 1

## 2020-11-07 MED ORDER — OXYCODONE-ACETAMINOPHEN 5-325 MG PO TABS
1.0000 | ORAL_TABLET | Freq: Three times a day (TID) | ORAL | Status: DC | PRN
  Administered 2020-11-07 – 2020-11-08 (×2): 1 via ORAL
  Filled 2020-11-07 (×2): qty 1

## 2020-11-07 MED ORDER — OXYBUTYNIN CHLORIDE ER 5 MG PO TB24
5.0000 mg | ORAL_TABLET | Freq: Every day | ORAL | Status: DC
  Administered 2020-11-07 – 2020-11-08 (×2): 5 mg via ORAL
  Filled 2020-11-07 (×2): qty 1

## 2020-11-07 MED ORDER — HYDROXYZINE HCL 50 MG PO TABS
50.0000 mg | ORAL_TABLET | Freq: Every day | ORAL | Status: DC
  Administered 2020-11-08: 50 mg via ORAL
  Filled 2020-11-07 (×2): qty 1

## 2020-11-07 MED ORDER — ENOXAPARIN SODIUM 40 MG/0.4ML ~~LOC~~ SOLN
40.0000 mg | SUBCUTANEOUS | Status: DC
  Administered 2020-11-07: 40 mg via SUBCUTANEOUS
  Filled 2020-11-07: qty 0.4

## 2020-11-07 MED ORDER — UMECLIDINIUM-VILANTEROL 62.5-25 MCG/INH IN AEPB
1.0000 | INHALATION_SPRAY | Freq: Every day | RESPIRATORY_TRACT | Status: DC
  Filled 2020-11-07: qty 14

## 2020-11-07 MED ORDER — INSULIN DEGLUDEC 200 UNIT/ML ~~LOC~~ SOPN: 70.0000 [IU] | PEN_INJECTOR | Freq: Every day | SUBCUTANEOUS | Status: DC

## 2020-11-07 MED ORDER — ACETAMINOPHEN 325 MG PO TABS: 650.0000 mg | ORAL_TABLET | Freq: Four times a day (QID) | ORAL | Status: DC | PRN

## 2020-11-07 MED ORDER — ROSUVASTATIN CALCIUM 20 MG PO TABS
20.0000 mg | ORAL_TABLET | Freq: Every day | ORAL | Status: DC
  Administered 2020-11-07: 20 mg via ORAL
  Filled 2020-11-07: qty 1

## 2020-11-07 MED ORDER — BUPROPION HCL ER (XL) 150 MG PO TB24
300.0000 mg | ORAL_TABLET | Freq: Every day | ORAL | Status: DC
  Administered 2020-11-08: 300 mg via ORAL
  Filled 2020-11-07: qty 2

## 2020-11-07 MED ORDER — INSULIN GLARGINE 100 UNIT/ML ~~LOC~~ SOLN
70.0000 [IU] | Freq: Every day | SUBCUTANEOUS | Status: DC
  Administered 2020-11-08: 70 [IU] via SUBCUTANEOUS
  Filled 2020-11-07 (×2): qty 0.7

## 2020-11-07 MED ORDER — NYSTATIN 100000 UNIT/GM EX POWD
1.0000 "application " | Freq: Three times a day (TID) | CUTANEOUS | Status: DC
  Administered 2020-11-07 – 2020-11-08 (×3): 1 via TOPICAL
  Filled 2020-11-07: qty 15

## 2020-11-07 MED ORDER — ALBUTEROL SULFATE HFA 108 (90 BASE) MCG/ACT IN AERS
1.0000 | INHALATION_SPRAY | Freq: Four times a day (QID) | RESPIRATORY_TRACT | Status: DC | PRN
  Administered 2020-11-07: 2 via RESPIRATORY_TRACT
  Filled 2020-11-07: qty 6.7

## 2020-11-07 NOTE — H&P (Signed)
NAME:  MARIXA MELLOTT, MRN:  [id number], DOB:  [birth year], LOS: 1 ADMISSION DATE:  11/07/2020, Primary: Angelica Pou, MD  CHIEF COMPLAINT:  Left flank pain  Medical Service: Internal Medicine Teaching Service         Attending Physician: Dr. Oda Kilts, MD    First Contact: Dr. Darrick Meigs Pager: [phone]  Second Contact: Dr. Wynetta Emery Pager: [phone]       After Hours (After 5p/  First Contact Pager: [phone]  weekends / holidays): Second Contact Pager: [phone]    History of present illness   65 yof with chronic bronchitis, chronic pain, DM2, PVD s/p BKA who was recently admitted to [facility] 11/7-11/10 for generalized weakness. She received a 3d antibiotic course for the treatment of a questionable UTI during that admission. Urine culture was without significant growth.  She is being directly admitted from the internal medicine center at the request of Dr. Carroll Sage after seeing her for an appointment today.  She notes a 5d history of progressive left flank pain associated with dysuria, increased frequency and urgency. Pain wraps circumferentially anterior to the suprapubic area. Difficult to ascertain if this is constant or more colicky. She endorses chills. Denies n/v,hematuria or abnormal vaginal discharge. She notes having felt well at time discharge last admission.  Past Medical History  She,  has a past medical history of Cataract, Chronic bronchitis ([place]), Chronic diastolic heart failure ([place]), Chronic lower back pain, Chronic pain syndrome (12/03/2011), COPD (01/08/2007), Diabetic peripheral neuropathy ([place]), DVT of upper extremity (deep vein thrombosis) ([place]) (03/11/2013), Environmental allergies, Fatty liver (2003), Fibromyalgia, GERD (gastroesophageal reflux disease), Glaucoma, History of bacterial endocarditis (2014), History of use of hearing aid, Hyperlipidemia, Hyperplastic colon polyp (12/2010), Hypertension, Juvenile rheumatoid arthritis ([place]), PVD  (peripheral vascular disease) with claudication ([place]), S/P BKA (below knee amputation) unilateral ([place]), Tobacco abuse, and Type II diabetes mellitus with peripheral circulatory disorders, uncontrolled (DX: 1993).   Home Medications     Prior to Admission medications   Medication Sig Start Date End Date Taking? Authorizing Provider  acetaminophen (TYLENOL) 325 MG tablet Take 650 mg by mouth every 6 (six) hours as needed for mild pain or headache.    [provider]  albuterol (PROAIR HFA) 108 (90 Base) MCG/ACT inhaler INHALE 2 PUFFS BY MOUTH EVERY 6 HOURS AS NEEDED FOR WHEEZING Patient taking differently: Inhale 1-2 puffs into the lungs every 6 (six) hours as needed for wheezing or shortness of breath.  01/16/20   Bartholomew Crews, MD  amLODipine (NORVASC) 10 MG tablet Take 1 tablet (10 mg total) by mouth daily. 10/26/20 11/25/20  Angelica Pou, MD  aspirin EC 81 MG tablet Take 1 tablet (81 mg total) by mouth daily. 10/26/20   Angelica Pou, MD  baclofen (LIORESAL) 10 MG tablet TAKE 1 TABLET BY MOUTH THREE TIMES A DAY AS NEEDED FOR MUSCLE SPASMS Patient taking differently: Take 10 mg by mouth daily as needed.  10/15/20   Angelica Pou, MD  benazepril (LOTENSIN) 40 MG tablet Take 1 tablet (40 mg total) by mouth daily. 09/05/20 10/29/20  Angelica Pou, MD  Biotin 5 MG TABS Take 5 mg by mouth daily.    [provider]  Blood Glucose Monitoring Suppl ([place]) w/Device KIT Check 4 times a day 02/28/20   Bartholomew Crews, MD  buPROPion (WELLBUTRIN XL) 300 MG 24 hr tablet Take 1 tablet (300 mg total) by mouth daily. 11/06/20   Angelica Pou,  MD  cholecalciferol (VITAMIN D3) 25 MCG (1000 UT) tablet Take 1 tablet (1,000 Units total) by mouth daily. 12/02/19   Granville Lewis C, PA-C  glucose blood (ONETOUCH VERIO) test strip 1 each by Other route as needed for other. Use as instructed. Tests 3-4 times daily. 10/15/20   Angelica Pou, MD  hydrochlorothiazide (HYDRODIURIL) 25 MG tablet Take 1 tablet (25 mg total) by mouth daily. 10/15/20   Angelica Pou, MD  hydrOXYzine (VISTARIL) 25 MG capsule Take 2 capsules (50 mg total) by mouth daily. 11/06/20   Angelica Pou, MD  insulin degludec (TRESIBA FLEXTOUCH) 200 UNIT/ML FlexTouch Pen Inject 70 Units into the skin daily. 10/15/20   Angelica Pou, MD  insulin lispro (HUMALOG KWIKPEN) 100 UNIT/ML KwikPen IF BLOOD SUGAR IS < 175 DO NOT TAKE ANY CORRECTION INSULIN. 176-225 INJECT 2 UNITS. 226-275 INJECT 4 UNITS. 920-100 INJECT 6 UNITS. 326-375 INJECT 8 UNITS. 712-197 INJECT 10 UNITS. 426-475 INJECT 12 UNITS. 588-325 INJECT 14 UNITS AND CALL OFFICE FOR FURTHER INSTRUCTIONS. Patient taking differently: Inject 0-14 Units into the skin See admin instructions. Inject 0-14 units under the skin 3 times daily per sliding scale: if blood sugar < 175: 0 unit; 176-225: 2 units; 226-275: units; 276-325: 6 units; 326-375: 8 units;  376-425: 10 units; 426-475: 12 units; 476-525: 14 units and call office for further instructions 08/14/20   Angelica Pou, MD  Insulin Pen Needle 32G X 4 MM MISC Use to inject insulin 4 times a day and semaglutide once weekly 09/05/20   Angelica Pou, MD  Insulin Syringe-Needle U-100 31G X 15/64" 0.3 ML MISC Use with Humalog to correct blood sugar before meals three times a day 12/02/19   Granville Lewis C, PA-C  Lancets [facility] ULTRASOFT) lancets Use as instructed 08/14/20   Angelica Pou, MD  metFORMIN (GLUCOPHAGE-XR) 500 MG 24 hr tablet 1 tablet daily with breakfast Patient taking differently: Take 500 mg by mouth daily with breakfast.  01/16/20   Bartholomew Crews, MD  Multiple Vitamin (MULTIVITAMIN WITH MINERALS) TABS tablet Take 1 tablet by mouth daily.    [provider]  nystatin (NYSTATIN) powder Apply 1 application topically 3 (three) times daily. 10/17/20   Angelica Pou, MD  omeprazole (PRILOSEC) 20 MG  capsule TAKE 1 CAPSULE (20 MG TOTAL) BY MOUTH 2 (TWO) TIMES DAILY BEFORE A MEAL. 05/07/20   Bartholomew Crews, MD  oxybutynin (DITROPAN-XL) 5 MG 24 hr tablet Take 5 mg by mouth daily. 10/04/20   [provider]  oxyCODONE-acetaminophen (PERCOCET/ROXICET) 5-325 MG tablet Take 1 tablet by mouth every 8 (eight) hours as needed. May take a 4th pill if severe pain (up to 10 days monthly) #100 equals 30 day supply 11/06/20   Angelica Pou, MD  OZEMPIC, 0.25 OR 0.5 MG/DOSE, 2 MG/1.5ML SOPN Inject 0.5 mg into the skin every Monday.  09/25/20   [provider]  pregabalin (LYRICA) 200 MG capsule TAKE 1 CAPSULE BY MOUTH THREE TIMES A DAY Patient taking differently: Take 200 mg by mouth 3 (three) times daily.  10/15/20   Angelica Pou, MD  rosuvastatin (CRESTOR) 20 MG tablet Take 1 tablet (20 mg total) by mouth at bedtime. 10/25/20   Angelica Pou, MD  umeclidinium-vilanterol (ANORO ELLIPTA) 62.5-25 MCG/INH AEPB Inhale 1 puff into the lungs daily. 10/25/20   Angelica Pou, MD  vitamin B-12 (CYANOCOBALAMIN) 500 MCG tablet Take 500 mcg by mouth daily.    [provider]  vitamin C (ASCORBIC ACID) 500 MG tablet Take 500 mg by mouth daily.    [provider]  Potassium Chloride ER 20 MEQ TBCR Take 40 mEq by mouth daily. 01/02/14 01/23/14  Othella Boyer, MD    Allergies    Allergies as of 11/07/2020 - Review Complete 11/07/2020  Allergen Reaction Noted  . Abilify [aripiprazole] Other (See Comments) 11/09/2015  . Iohexol Other (See Comments) 08/17/2007  . Ivp dye [iodinated diagnostic agents] Other (See Comments) 05/10/2013  . Morphine sulfate Itching and Rash     Social History   reports that she has been smoking cigarettes. She has a 50.00 pack-year smoking history. She has never used smokeless tobacco. She reports that she does not drink alcohol and does not use drugs.   Family History   Her family history includes Asthma in her father; CAD  (age of onset: 18) in her sister; Congestive Heart Failure in her mother; Diabetes in her mother; Diverticulosis in her mother; Heart disease in her sister; Hypertension in her mother. There is no history of Breast cancer.   ROS  ROS negative unless stated in hpi.  Objective   Blood pressure 127/61, pulse 66, temperature 97.8 F (36.6 C), temperature source Oral, resp. rate 18, height 5' 7"  (1.702 m), weight 113.1 kg, SpO2 97 %.    Filed Weights   11/07/20 1732  Weight: 113.1 kg    Examination: GENERAL: chronically ill appearing but  in no acute distress HEENT: normal CARDIAC: heart RRR. +2 pitting edema of the RLE PULMONARY: breathing comfortably on room air. Lungs clear to auscultation ABDOMEN: obese. Left CVA tenderness. Pain on palpation of the left flank and suprapubic area. NEURO: CN II-XII grossly intact. SKIN: no rash or lesions on limited exam  MSK: left BKA with prothesis  Significant Diagnostic Tests:  CT renal stone study>>  Labs    CBC Latest Ref Rng & Units 11/07/2020 10/31/2020 10/30/2020  WBC 4.0 - 10.5 K/uL 10.4 8.2 8.5  Hemoglobin 12.0 - 15.0 g/dL 11.8(L) 10.8(L) 10.5(L)  Hematocrit 36 - 46 % 37.1 33.0(L) 32.2(L)  Platelets 150 - 400 K/uL 298 137(L) 108(L)   BMP Latest Ref Rng & Units 11/07/2020 10/31/2020 10/30/2020  Glucose 70 - 99 mg/dL 82 178(H) 244(H)  BUN 8 - 23 mg/dL 15 18 17   Creatinine 0.44 - 1.00 mg/dL 1.11(H) 1.18(H) 1.26(H)  BUN/Creat Ratio 12 - 28 - - -  Sodium 135 - 145 mmol/L 142 139 136  Potassium 3.5 - 5.1 mmol/L 4.1 4.4 3.7  Chloride 98 - 111 mmol/L 107 106 104  CO2 22 - 32 mmol/L 27 24 23   Calcium 8.9 - 10.3 mg/dL 9.2 8.9 8.6(L)     Summary  67 yof recently admitted 11/7-11/10 for generalized weakness at which time she underwent a 3d antibiotic course for UTI--urine culture was without significant growth. She is presenting today with left flank pain that may represent nephrolithiasis vs complicated UTI.  Assessment & Plan:   Active Problems:   Flank pain  Left flank pain. Overall clinical picture most consistent with nephrolithiasis. Primary concern in PCP office today was pyelonephritis however I would consider this less likely in the absence of fever or leukocytosis.  Hemodynamically stable, afebrile. Low suspicion for bacteremia. Renal function is at baseline so low concern for obstructive hydronephrosis. Bacturia present on 11/7 UA--negative nitrites. No significant growth on that urine culture. Completed 3d course of IV rocephin 11/7-11/10. Abdominal CT from last admission was unrevealing aside from a left lateral  obiquie muscle hematoma. Hgb remains stable from last admission so low suspicion for progression. Plan --CT renal stone study --will defer abx for now unless she develops systemic symtpoms --UA/UC --CBC in AM to evaluate for leukocytosis  --BMP to monitor changes in renal function  Physical deconditioning. Pt evaluated by PT/OT last admission who recommended CIR however CIR did not feel she was a candidate. Rehab at a skilled facility was offered but pt declined.  Pt remains strongly resistant to undergoing rehabilitation at a skilled facility. No PT eval needed this admission as pt is not interested in undergoing inpatient rehabilitation  Type 2 Diabetes Mellitus. Lantus 70U daily., moderate SSI. Adjust accordingly. Can resume metformin in the morning if renal function remains stable.   Chronic diastolic heart failure. Pitting edema in LLE. She is at her dry weight. Hypertension. continue home amlodipine, hctz, benazapril, crestor 59m  COPD. Continue albuterol and anoro. Nocturnal hypoxemia. Sleep study performed 11/2019 not suggestive of sleep apnea. Continue noctural oxygen supplementation.  CKD stage 3a. Renal function at baseline.   Chronic pain. Continue percocet 5-3213mevery 8h prn, baclofen 1084mID Peripheral neuropathy. Continue lyrica. Chronic depression. Continue buproprion  300m24mily.   Urinary urgency. Continue oxybutynin.   Best practice:  CODE STATUS: Full Diet: HH/CM DVT for prophylaxis: lovenox Dispo: Admit patient to Observation with expected length of stay less than 2 midnights.   RyleMitzi Hansen Internal Medicine Resident PGY-2 [facility] Pager: #[phone]/2021 6:37 PM

## 2020-11-07 NOTE — Progress Notes (Signed)
Report called to Joseph Art, RN on [place].  Patient remains alert and oriented.  NS in left forearm 22 Gauge.  Patient transported via her personal wheelchair .  Sander Nephew, RN 11/07/2020 4:45 PM.

## 2020-11-07 NOTE — Assessment & Plan Note (Addendum)
Patient notes pain to the right side of her nose and nasal bridge with onset roughly 4 days ago.  Does not recall any trauma or inciting factors.  Denies any congestion or rhinorrhea.  On exam it is very tender.  No sinus tenderness.  Had recent head CT 2 weeks ago which did not show any pathology, but symptoms started after that.  From exam, seems to be a musculoskeletal/soft tissue problem.  -Admitting today for other acute complaint, evaluate at next visit if still persistent

## 2020-11-07 NOTE — Progress Notes (Signed)
NEW ADMISSION NOTE  Arrival Method: bed Mental Orientation: Alert and oriented x4 Telemetry: No Assessment: Completed Skin: see notes Iv: right forearm Pain: 0 Tubes: 0 Safety Measures: Safety Fall Prevention Plan has been given, discussed and signed Admission: Completed [facility] Orientation: Patient has been orientated to the room, unit and staff.  Family: 0  Orders have been reviewed and implemented. Will continue to monitor the patient. Call light has been placed within reach and bed alarm has been activated.  Patient is a poor historian and unable to answer admitting questions  Patient refused to answer all required admitting documentation, patient was educated and RN will continue to monitor this patient.  Beatris Ship, RN

## 2020-11-07 NOTE — Progress Notes (Signed)
MD on call notified that patient is having pain and nausea and need medication IV for symptoms. Arthor Captain LPN

## 2020-11-07 NOTE — Assessment & Plan Note (Addendum)
Admitted from 11/7 to 11/10 with hypotension and met sepsis criteria, likely source from UTI though her UA only showed few bacteria large leukocyte esterase. Normal lactate. Urine culture with less than 10,000 bacteria. No fevers during hospitalization. Received 3 days of ceftriaxone, no Abx on discharge. She note that since discharge, she has had increasing dysuria, suprapubic pain pain, and L flank pain. Reports subjective fevers at home. Also reporting a burning sensation when urinating. BP: (!) 135/53  -admission for presumed pyelonephritis -ordered CBC, CMP, blood culture x2, lactate -attempted to collect UA and urine culture but patient had just voided and could not provide sample

## 2020-11-07 NOTE — Assessment & Plan Note (Signed)
Had recent admission for which she presented to ED for a fall, noted several falls over prior weeks due to generalized weakness.  Likely due to polypharmacy.  She was on several centrally acting medications including baclofen, Wellbutrin, hydroxyzine, insulin, oxybutynin, Percocet, Lyrica. She refused medication adjustments during time of discharge. Other contribute factors include UTI and dehydration from poor p.o. intake in setting of multiple comorbidities. Had negative head CT, normal troponin, normal TSH. Recommended SNF, but patient refused and wanted to go home. She reports 3 prior bad experience at SNF, and is very resistant when I spoke with her about it today. She has a rolling wheelchair walker at home, we are trying to obtain a power wheelchair for her. She currently sleeps on a loveseat and spends most of her day on it as well. Feels that weakness had improved by time of discharge, but worsened again since then, likely 2/2 urinary infection. Denies chest pain, palpitations, falls, passing out.  -discussed polypharmacy briefly but she is reluctant to discontinue her meds -admitting to hospital today for presumed pyelonephritis

## 2020-11-08 ENCOUNTER — Other Ambulatory Visit (HOSPITAL_COMMUNITY): Payer: Self-pay | Admitting: Internal Medicine

## 2020-11-08 ENCOUNTER — Encounter: Payer: Medicare Other | Admitting: Internal Medicine

## 2020-11-08 DIAGNOSIS — R300 Dysuria: Secondary | ICD-10-CM | POA: Diagnosis present

## 2020-11-08 DIAGNOSIS — R1032 Left lower quadrant pain: Secondary | ICD-10-CM | POA: Diagnosis not present

## 2020-11-08 DIAGNOSIS — R109 Unspecified abdominal pain: Secondary | ICD-10-CM | POA: Diagnosis not present

## 2020-11-08 LAB — CBC
HCT: 33.9 % — ABNORMAL LOW (ref 36.0–46.0)
Hemoglobin: 10.8 g/dL — ABNORMAL LOW (ref 12.0–15.0)
MCH: 30.8 pg (ref 26.0–34.0)
MCHC: 31.9 g/dL (ref 30.0–36.0)
MCV: 96.6 fL (ref 80.0–100.0)
Platelets: 243 10*3/uL (ref 150–400)
RBC: 3.51 MIL/uL — ABNORMAL LOW (ref 3.87–5.11)
RDW: 15.2 % (ref 11.5–15.5)
WBC: 9.1 10*3/uL (ref 4.0–10.5)
nRBC: 0 % (ref 0.0–0.2)

## 2020-11-08 LAB — BASIC METABOLIC PANEL
Anion gap: 7 (ref 5–15)
BUN: 16 mg/dL (ref 8–23)
CO2: 27 mmol/L (ref 22–32)
Calcium: 8.7 mg/dL — ABNORMAL LOW (ref 8.9–10.3)
Chloride: 107 mmol/L (ref 98–111)
Creatinine, Ser: 1.08 mg/dL — ABNORMAL HIGH (ref 0.44–1.00)
GFR, Estimated: 58 mL/min — ABNORMAL LOW (ref >60)
Glucose, Bld: 226 mg/dL — ABNORMAL HIGH (ref 70–99)
Potassium: 4.3 mmol/L (ref 3.5–5.1)
Sodium: 141 mmol/L (ref 135–145)

## 2020-11-08 LAB — URINE CULTURE: Culture: <10000 — AB

## 2020-11-08 LAB — GLUCOSE, CAPILLARY
Glucose-Capillary: 164 mg/dL — ABNORMAL HIGH (ref 70–99)
Glucose-Capillary: 237 mg/dL — ABNORMAL HIGH (ref 70–99)
Glucose-Capillary: 242 mg/dL — ABNORMAL HIGH (ref 70–99)
Glucose-Capillary: 285 mg/dL — ABNORMAL HIGH (ref 70–99)

## 2020-11-08 LAB — LIPASE, BLOOD: Lipase: 36 U/L (ref 11–51)

## 2020-11-08 MED ORDER — PHENAZOPYRIDINE HCL 200 MG PO TABS
200.0000 mg | ORAL_TABLET | Freq: Three times a day (TID) | ORAL | 0 refills | Status: DC
Start: 2020-11-08 — End: 2021-02-15

## 2020-11-08 MED ORDER — PIPERACILLIN-TAZOBACTAM 3.375 G IVPB
3.3750 g | Freq: Three times a day (TID) | INTRAVENOUS | Status: DC
  Administered 2020-11-08 (×2): 3.375 g via INTRAVENOUS
  Filled 2020-11-08 (×3): qty 50

## 2020-11-08 MED ORDER — PHENAZOPYRIDINE HCL 200 MG PO TABS
200.0000 mg | ORAL_TABLET | Freq: Three times a day (TID) | ORAL | Status: DC
  Filled 2020-11-08 (×2): qty 1

## 2020-11-08 MED ORDER — NITROFURANTOIN MONOHYD MACRO 100 MG PO CAPS: 100.0000 mg | ORAL_CAPSULE | Freq: Two times a day (BID) | ORAL | 0 refills | Status: DC

## 2020-11-08 MED FILL — PHENAZOPYRIDINE 100 MG TAB: 100 | 8 days supply | Qty: 20 | Fill #0

## 2020-11-08 MED FILL — NITROFURANTOIN MONO-MCR 100: 100 | 5 days supply | Qty: 10 | Fill #0

## 2020-11-08 NOTE — Progress Notes (Signed)
Inpatient Diabetes Program Recommendations  AACE/ADA: New Consensus Statement on Inpatient Glycemic Control (2015)  Target Ranges:  Prepandial:   less than 140 mg/dL      Peak postprandial:   less than 180 mg/dL (1-2 hours)      Critically ill patients:  140 - 180 mg/dL   Lab Results  Component Value Date   GLUCAP 285 (H) 11/08/2020   HGBA1C 7.1 (A) 10/24/2020    Review of Glycemic Control Results for Jennifer [name], Jennifer [name] (MRN [id number]) as of 11/08/2020 12:39  Ref. Range 11/08/2020 00:28 11/08/2020 07:14 11/08/2020 11:25  Glucose-Capillary Latest Ref Range: 70 - 99 mg/dL 237 (H) 164 (H) 285 (H)   Diabetes history:  T2DM Outpatient Diabetes medications:  Tresiba 70 units daily Ozempic 1.5 q Monday Humalog 0-14 units tid Metformin 500 mg qam Current orders for Inpatient glycemic control:  Tresiba 70 units daily  Inpatient Diabetes Program Recommendations:     Please consider glycemic control order set with Novolog 0-9 units tid and 0-5 qhs.  Will continue to follow while inpatient.  Thank you, Reche Dixon, RN, BSN Diabetes Coordinator Inpatient Diabetes Program [phone] (team pager from 8a-5p)

## 2020-11-08 NOTE — Progress Notes (Signed)
DISCHARGE NOTE HOME  Jennifer [name] to be discharged Home per MD order. Discussed prescriptions and follow up appointments with the patient. Prescriptions given to patient; medication list explained in detail. Patient verbalized understanding.  Skin clean, dry and intact without evidence of skin break down, no evidence of skin tears noted. IV catheter discontinued intact. Site without signs and symptoms of complications. Dressing and pressure applied. Pt denies pain at the site currently. No complaints noted.  Patient free of lines, drains, and wounds.   An After Visit Summary (AVS) was printed and given to the patient. Patient escorted via wheelchair, and discharged home via private auto.  Beatris Ship, RN

## 2020-11-08 NOTE — Progress Notes (Signed)
Secure chat to informs UA resulted. Arthor Captain LPN

## 2020-11-08 NOTE — Progress Notes (Signed)
Subjective:   [name]. Jennifer [name] is a 59 year old female with past medical history of chronic bronchitis, chronic pain, T2DM, PVD s/p BKA who was recently admitted to [facility] from 11/7 to 11/10 for generalized weakness, managed with three day course of antibiotics for urinary tract infection although negative urine culture, who presented to [facility] clinic with fevers, worsening dysuria, suprapubic pain and left flank pain concerning for pyelonephritis.  Overnight, patient underwent CT renal stone study which was negative for a renal or ureteral stone or hydronephrosis. Additionally, patient complaining of 11/10 abdominal pain and requesting IV pain medication.  Pt is appearing comfortable, sitting up in bed eating breakfast this morning. Reviewed lab and imaging results with her. Discussed that there are no findings to indicate continue inpatient workup however encouraged her to continue to follow with her PCP for this.  Objective:  Vital signs in last 24 hours: Vitals:   11/07/20 1722 11/07/20 1732 11/07/20 2131 11/08/20 0519  BP: 127/61  (!) 143/71 118/63  Pulse: 66  63 (!) 56  Resp: 18  16 16   Temp: 97.8 F (36.6 C)  98.2 F (36.8 C) 98.3 F (36.8 C)  TempSrc: Oral     SpO2: 97%  93% 92%  Weight:  113.1 kg 113.1 kg   Height:  5\' 7"  (1.702 m)    SpO2: 92 %  Intake/Output Summary (Last 24 hours) at 11/08/2020 0750 Last data filed at 11/08/2020 0519 Gross per 24 hour  Intake 0 ml  Output 1000 ml  Net -1000 ml   Filed Weights   11/07/20 1732 11/07/20 2131  Weight: 113.1 kg 113.1 kg   Physical Exam General: chronically ill appearing in NAD Pulm: Lungs clear throughout Abd: no tenderness on light palpation when performed with palpation.   CBC Latest Ref Rng & Units 11/08/2020 11/07/2020 10/31/2020  WBC 4.0 - 10.5 K/uL 9.1 10.4 8.2  Hemoglobin 12.0 - 15.0 g/dL 10.8(L) 11.8(L) 10.8(L)  Hematocrit 36 - 46 % 33.9(L) 37.1 33.0(L)  Platelets 150 - 400 K/uL 243 298 137(L)   CMP  Latest Ref Rng & Units 11/08/2020 11/07/2020 10/31/2020  Glucose 70 - 99 mg/dL 226(H) 82 178(H)  BUN 8 - 23 mg/dL 16 15 18   Creatinine 0.44 - 1.00 mg/dL 1.08(H) 1.11(H) 1.18(H)  Sodium 135 - 145 mmol/L 141 142 139  Potassium 3.5 - 5.1 mmol/L 4.3 4.1 4.4  Chloride 98 - 111 mmol/L 107 107 106  CO2 22 - 32 mmol/L 27 27 24   Calcium 8.9 - 10.3 mg/dL 8.7(L) 9.2 8.9  Total Protein 6.5 - 8.1 g/dL - 6.7 -  Total Bilirubin 0.3 - 1.2 mg/dL - 0.2(L) -  Alkaline Phos 38 - 126 U/L - 80 -  AST 15 - 41 U/L - 20 -  ALT 0 - 44 U/L - 28 -   Urine culture - pending  IMAGING: CT RENAL STONE STUDY  Result Date: 11/07/2020 CLINICAL DATA:  Left flank pain EXAM: CT ABDOMEN AND PELVIS WITHOUT CONTRAST TECHNIQUE: Multidetector CT imaging of the abdomen and pelvis was performed following the standard protocol without IV contrast. COMPARISON:  10/28/2020 FINDINGS: Lower chest: Lung bases are clear. No effusions. Heart is normal size. Hepatobiliary: No focal hepatic abnormality. Gallbladder unremarkable. Pancreas: No focal abnormality or ductal dilatation. Spleen: No focal abnormality.  Normal size. Adrenals/Urinary Tract: 2.7 cm hyperdense lesion in the midpole of the right kidney, likely hyperdense cyst as seen on prior studies. No hydronephrosis. No renal or ureteral stones bilaterally. Adrenal glands and urinary  bladder unremarkable. Stomach/Bowel: Scattered colonic diverticula. No active diverticulitis. Normal appendix. Stomach and small bowel decompressed, unremarkable. Vascular/Lymphatic: Heavily calcified aorta and iliac vessels. No aneurysm or adenopathy. Reproductive: Prior hysterectomy.  No adnexal masses. Other: No free fluid or free air. Previously seen left lateral abdominal musculature thickening is no longer visualized. No abdominal wall abnormality. Musculoskeletal: No acute bony abnormality. IMPRESSION: No renal or ureteral stones.  No hydronephrosis. Stable hyperdense right renal midpole parapelvic lesion,  likely hyperdense/hemorrhagic cyst. Aortic atherosclerosis. Electronically Signed   By: Rolm Baptise M.D.   On: 11/07/2020 20:14   Assessment/Plan:  Active Problems:   Flank pain  54 yof recently admitted 11/7-11/10 for generalized weakness at which time she underwent a 3d antibiotic course for UTI--urine culture was without significant growth. She was readmitted to our service 11/17 after being seen in clinic as there was concern for pyelonephritis.  Diagnostic evaluation with labs, UA, and CT have been unrevealing for the source of her pain. She is medically stable for discharge today with close outpatient follow up.  Dysuria/ left flank pain. Nephrolithiasis, pyelonephritis r/o with CT renal study. I would consider urinary tract infection very unlikely given her UA that was negative for bacteria or nitrites and is further supported in the absence of fever, leukocytosis or other systemic symptoms. No hematuria on UA to suggest interstitial cystitis. Renal function remains stable. The left oblique hematoma is not visible on the CT from night which most likely indicates resolution. At this point, it is really not clear what the cause is of her symptoms.  Plan --trial a 2d course of AZO 200mg  TID --will give her another course of antibiotics with macrobid 100mg  BID for 5d --if symptoms persist after this time, recommend outpatient referral to urology for further investigation  Type 2 Diabetes Mellitus. Home meds to be resumed at discharge  Chronic diastolic heart failure.  Hypertension. continue home amlodipine, hctz, benazapril, crestor 20mg   COPD. Continue albuterol and anoro. Nocturnal hypoxemia. Sleep study performed 11/2019 not suggestive of sleep apnea. Continue noctural oxygen supplementation.  CKD stage 3a. Renal function at baseline.   Chronic pain. Continue percocet 5-325mg  every 8h prn, baclofen 10mg  TID Peripheral neuropathy. Continue lyrica. Chronic depression. Continue  buproprion 300mg  daily.   Urinary urgency. Continue oxybutynin.   Disposition: medically stable for discharge home today  Mitzi Hansen, MD Internal Medicine Resident PGY-2 [facility] Pager: [phone] 11/08/2020 12:29 PM

## 2020-11-08 NOTE — Discharge Summary (Addendum)
Name: Jennifer [name] MRN: [id number] DOB: 1961/02/11 59 y.o. PCP: Angelica Pou, MD  Date of Admission: 11/07/2020  5:15 PM Date of Discharge:  11/08/20 Attending Physician: Oda Kilts, MD  Discharge Diagnosis: 1. XMIWOEH/ left flank  Discharge Medications: Allergies as of 11/08/2020       Reactions   Abilify [aripiprazole] Other (See Comments)   Urinary freq Nov 2016   Iohexol Other (See Comments)   IV CONTRAST CAUSED TRANSIENT NEPHROPATHY (KIDNEY INSUFFICIENCY) IN 2007   Ivp Dye [iodinated Diagnostic Agents] Other (See Comments)   IV CONTRAST CAUSED TRANSIENT NEPHROPATHY (KIDNEY INSUFFICIENCY) IN 2007   Morphine Sulfate Itching, Rash        Medication List     STOP taking these medications    acetaminophen 325 MG tablet Commonly known as: TYLENOL       TAKE these medications    albuterol 108 (90 Base) MCG/ACT inhaler Commonly known as: ProAir HFA INHALE 2 PUFFS BY MOUTH EVERY 6 HOURS AS NEEDED FOR WHEEZING What changed:  how much to take how to take this when to take this reasons to take this additional instructions   amLODipine 10 MG tablet Commonly known as: NORVASC Take 1 tablet (10 mg total) by mouth daily.   Anoro Ellipta 62.5-25 MCG/INH Aepb Generic drug: umeclidinium-vilanterol Inhale 1 puff into the lungs daily.   aspirin EC 81 MG tablet Take 1 tablet (81 mg total) by mouth daily.   baclofen 10 MG tablet Commonly known as: LIORESAL TAKE 1 TABLET BY MOUTH THREE TIMES A DAY AS NEEDED FOR MUSCLE SPASMS What changed:  how much to take how to take this when to take this reasons to take this additional instructions   benazepril 40 MG tablet Commonly known as: LOTENSIN Take 1 tablet (40 mg total) by mouth daily.   Biotin 5 MG Tabs Take 5 mg by mouth daily.   buPROPion 300 MG 24 hr tablet Commonly known as: WELLBUTRIN XL Take 1 tablet (300 mg total) by mouth daily.   cholecalciferol 25 MCG (1000 UNIT)  tablet Commonly known as: VITAMIN D3 Take 1 tablet (1,000 Units total) by mouth daily.   hydrochlorothiazide 25 MG tablet Commonly known as: HYDRODIURIL Take 1 tablet (25 mg total) by mouth daily.   hydrOXYzine 25 MG capsule Commonly known as: VISTARIL Take 2 capsules (50 mg total) by mouth daily.   insulin lispro 100 UNIT/ML KwikPen Commonly known as: HumaLOG KwikPen IF BLOOD SUGAR IS < 175 DO NOT TAKE ANY CORRECTION INSULIN. 176-225 INJECT 2 UNITS. 226-275 INJECT 4 UNITS. 212-248 INJECT 6 UNITS. 326-375 INJECT 8 UNITS. 250-037 INJECT 10 UNITS. 426-475 INJECT 12 UNITS. 048-889 INJECT 14 UNITS AND CALL OFFICE FOR FURTHER INSTRUCTIONS. What changed:  how much to take how to take this when to take this additional instructions   Insulin Pen Needle 32G X 4 MM Misc Use to inject insulin 4 times a day and semaglutide once weekly   Insulin Syringe-Needle U-100 31G X 15/64" 0.3 ML Misc Use with Humalog to correct blood sugar before meals three times a day   metFORMIN 500 MG 24 hr tablet Commonly known as: GLUCOPHAGE-XR 1 tablet daily with breakfast What changed:  how much to take how to take this when to take this additional instructions   multivitamin with minerals Tabs tablet Take 1 tablet by mouth daily.   nitrofurantoin (macrocrystal-monohydrate) 100 MG capsule Commonly known as: Macrobid Take 1 capsule (100 mg total) by mouth 2 (two) times daily for  5 days.   nystatin powder Commonly known as: nystatin Apply 1 application topically 3 (three) times daily.   omeprazole 20 MG capsule Commonly known as: PRILOSEC TAKE 1 CAPSULE (20 MG TOTAL) BY MOUTH 2 (TWO) TIMES DAILY BEFORE A MEAL.   onetouch ultrasoft lancets Use as instructed   OneTouch Verio Flex System w/Device Kit Check 4 times a day   OneTouch Verio test strip Generic drug: glucose blood 1 each by Other route as needed for other. Use as instructed. Tests 3-4 times daily.   oxybutynin 5 MG 24 hr  tablet Commonly known as: DITROPAN-XL Take 5 mg by mouth daily.   oxyCODONE-acetaminophen 5-325 MG tablet Commonly known as: PERCOCET/ROXICET Take 1 tablet by mouth every 8 (eight) hours as needed. May take a 4th pill if severe pain (up to 10 days monthly) #100 equals 30 day supply   Ozempic (0.25 or 0.5 MG/DOSE) 2 MG/1.5ML Sopn Generic drug: Semaglutide(0.25 or 0.5MG/DOS) Inject 0.5 mg into the skin every Monday.   phenazopyridine 200 MG tablet Commonly known as: PYRIDIUM Take 1 tablet (200 mg total) by mouth 3 (three) times daily with meals.   pregabalin 200 MG capsule Commonly known as: LYRICA TAKE 1 CAPSULE BY MOUTH THREE TIMES A DAY What changed:  how much to take how to take this when to take this additional instructions   rosuvastatin 20 MG tablet Commonly known as: CRESTOR Take 1 tablet (20 mg total) by mouth at bedtime. What changed: when to take this   [place] FlexTouch 200 UNIT/ML FlexTouch Pen Generic drug: insulin degludec Inject 70 Units into the skin daily.   vitamin B-12 500 MCG tablet Commonly known as: CYANOCOBALAMIN Take 500 mcg by mouth daily.   vitamin C 500 MG tablet Commonly known as: ASCORBIC ACID Take 500 mg by mouth daily.        Disposition and follow-up:   Ms.Bellanie L Sprankle was discharged from [facility] in Stable condition.  At the hospital follow up visit please address:  Dysuria/ Left flank pain. Workup this admission, including labs, UA, and CT unrevealing. Pt follows at [facility] and notes that she has an appointment with them on 11/19. New medications -macrobid 171m twice daily for 5 days -AZO 2061mthree times daily for 2 days Follow up with [facility] 11/19 as previously arranged  2.  Labs / imaging needed at time of follow-up: none  3.  Pending labs/ test needing follow-up: urine/blood culture  Follow-up Appointments:  Follow-up Information     WiAngelica PouMD Follow up in 5  day(s).   Specialty: Internal Medicine Contact information: [phone]               Hospital Course: 6110of who was recently admitted to our service 11/7-11/10 for generalized weakness which is chronic. UA on that admission was equivocal and she received a 3d course of rocephin for treatment of a UTI. She was seen in our clinic on 11/17 for 5d history of left flank pain and dysuria. Due to concern for possible pyelonephritis in the clinic, our service was asked to admit her into the hospital for further evaluation. I obtained a CT renal study on admission which was unremarkable. UA was negative for bacteria and nitrites but did show 21-50 WBCs. As she described dysuria, we treated her with 5 days of nitrofurantoin. She was scheduled to see urology the day after discharge.   Discharge Vitals:  BP 136/74 (BP Location: Left Arm)   Pulse (!) 56   Temp 98.4 F (36.9 C) (Oral)   Resp 16   Ht _0  (1.702 m)   Wt 113.1 kg   SpO2 91%   BMI 39.05 kg/m   Pertinent Labs, Studies, and Procedures:  CT renal study: negative for stones. Stable hyperdense right renal midpole parapelvic lesion likely a hyperdense or hemorrhagic cyst.   CBC Latest Ref Rng & Units 11/08/2020 11/07/2020 10/31/2020  WBC 4.0 - 10.5 K/uL 9.1 10.4 8.2  Hemoglobin 12.0 - 15.0 g/dL 10.8(L) 11.8(L) 10.8(L)  Hematocrit 36 - 46 % 33.9(L) 37.1 33.0(L)  Platelets 150 - 400 K/uL 243 298 137(L)   BMP Latest Ref Rng & Units 11/08/2020 11/07/2020 10/31/2020  Glucose 70 - 99 mg/dL 226(H) 82 178(H)  BUN 8 - 23 mg/dL _1 Creatinine 0.44 - 1.00 mg/dL 1.08(H) 1.11(H) 1.18(H)  BUN/Creat Ratio 12 - 28 - - -  Sodium 135 - 145 mmol/L 141 142 139  Potassium 3.5 - 5.1 mmol/L 4.3 4.1 4.4  Chloride 98 - 111 mmol/L 107 107 106  CO2 22 - 32 mmol/L _2 Calcium 8.9 - 10.3 mg/dL 8.7(L) 9.2 8.9   UA  Ref Range & Units 2 d ago 12 d ago 1 yr ago  Color, Urine YELLOW YELLOW   YELLOW  YELLOW   APPearance CLEAR CLEAR  CLOUDY Abnormal   CLEAR   Specific Gravity, Urine 1.005 - 1.030 1.012  1.011  1.015   pH 5.0 - 8.0 5.0  5.0  5.0   Glucose, UA NEGATIVE mg/dL >=500 Abnormal   >=500 Abnormal   >=500 Abnormal    Hgb urine dipstick NEGATIVE NEGATIVE  MODERATE Abnormal   SMALL Abnormal    Bilirubin Urine NEGATIVE NEGATIVE  NEGATIVE  NEGATIVE   Ketones, ur NEGATIVE mg/dL NEGATIVE  NEGATIVE  NEGATIVE   Protein, ur NEGATIVE mg/dL NEGATIVE  100 Abnormal   30 Abnormal    Nitrite NEGATIVE NEGATIVE  NEGATIVE  POSITIVE Abnormal    Leukocytes,Ua NEGATIVE SMALL Abnormal   LARGE Abnormal   NEGATIVE   RBC / HPF 0 - 5 RBC/hpf 0-5  21-50  0-5   WBC, UA 0 - 5 WBC/hpf 21-50  >50 High   6-10   Bacteria, UA NONE SEEN NONE SEEN  FEW Abnormal   MANY Abnormal    Squamous Epithelial / LPF 0 - 5 0-5        Discharge Instructions: Discharge Instructions     Call MD for:  difficulty breathing, headache or visual disturbances   Complete by: As directed    Call MD for:  extreme fatigue   Complete by: As directed    Call MD for:  persistant dizziness or light-headedness   Complete by: As directed    Call MD for:  persistant nausea and vomiting   Complete by: As directed    Call MD for:  redness, tenderness, or signs of infection (pain, swelling, redness, odor or green/yellow discharge around incision site)   Complete by: As directed    Call MD for:  severe uncontrolled pain   Complete by: As directed    Call MD for:  temperature >100.4   Complete by: As directed    Diet - low sodium heart healthy   Complete by: As directed        Signed: Mitzi Hansen, MD Internal Medicine Resident PGY-2 [facility] Pager: [phone] 11/08/2020 2:15 PM

## 2020-11-08 NOTE — Plan of Care (Signed)

## 2020-11-09 NOTE — Progress Notes (Signed)
Internal Medicine Clinic Attending  I saw and evaluated the patient.  I personally confirmed the key portions of the history and exam documented by Dr. Chen and I reviewed pertinent patient test results.  The assessment, diagnosis, and plan were formulated together and I agree with the documentation in the resident's note.  

## 2020-11-12 LAB — CULTURE, BLOOD (SINGLE)
Culture: NO GROWTH
Culture: NO GROWTH
Special Requests: ADEQUATE
Special Requests: ADEQUATE

## 2020-11-13 ENCOUNTER — Encounter: Payer: Medicare Other | Admitting: Internal Medicine

## 2020-11-19 ENCOUNTER — Encounter: Payer: Medicare Other | Admitting: Internal Medicine

## 2020-11-20 ENCOUNTER — Ambulatory Visit (INDEPENDENT_AMBULATORY_CARE_PROVIDER_SITE_OTHER): Payer: Medicare Other | Admitting: Internal Medicine

## 2020-11-20 VITALS — BP 157/62 | HR 84 | Temp 98.1°F | Wt 261.9 lb

## 2020-11-20 DIAGNOSIS — R300 Dysuria: Secondary | ICD-10-CM | POA: Diagnosis not present

## 2020-11-20 DIAGNOSIS — E118 Type 2 diabetes mellitus with unspecified complications: Secondary | ICD-10-CM

## 2020-11-20 DIAGNOSIS — Z794 Long term (current) use of insulin: Secondary | ICD-10-CM

## 2020-11-20 LAB — POCT URINALYSIS DIPSTICK
Bilirubin, UA: NEGATIVE
Glucose, UA: POSITIVE — AB
Ketones, UA: NEGATIVE
Nitrite, UA: NEGATIVE
Protein, UA: NEGATIVE
Spec Grav, UA: 1.02 (ref 1.010–1.025)
Urobilinogen, UA: 0.2 E.U./dL
pH, UA: 7 (ref 5.0–8.0)

## 2020-11-20 LAB — GLUCOSE, CAPILLARY: Glucose-Capillary: 270 mg/dL — ABNORMAL HIGH (ref 70–99)

## 2020-11-20 NOTE — Assessment & Plan Note (Addendum)
Patient presented today for follow up of recent hospitalization for flank pain and dysuria. This has been going on for a while and she had 2 hospitalization this month for same symptoms (Admitted for11/17-11/18 for  Dysuria/ left flank Admitted from 11/7 to 11/10 with hypotension and met sepsis criteria, likely source from UTI), but her UA only showed few bacteria large leukocyte esterase. Her Urine culture with less than 10,000 bacteria. She received Abx but continued to have dysuria, suprapubic pain pain, and L flank pain.  At recent hospitalization, UA only showed mild pyuria. and CT abdomen was unrevealing. BC negative and UC un significant. Thought to have infectious source versus interstitial cystitis. She finished the course of PO AB and Azo. (-Received macrobid 128m twice daily for 5 days and AZO 2058mthree times daily for 2 days) Still has dysuria and frequency and no change in flank pain but her weakness is better.  Afebrile, no CVA tenderness on exam and POC UA today was unchanged. No further ABx today but she soul be evaluated by urology for further recommendations. Her kidney function was stable on recent hospitalization.  She was supposed to be seen by urologist. It has not happened yet.  [facility] office. Pt has an appointment on 12/3 1 PM.  -Follow up with [facility] 11/23/2020 1 pm (Pt is aware) [facility] reassess and to make sure the referral and work up is being done or sooner as needed

## 2020-11-20 NOTE — Addendum Note (Signed)
Addended by: Hulan Fray on: 11/20/2020 09:49 AM   Modules accepted: Orders

## 2020-11-20 NOTE — Patient Instructions (Signed)
Thank you for allowing [place] to provide your care today. We had appointment for you with urologist at 12/3 at 1 PM. Please make sure to be there on time and take your medications with you.  Today we made no changes to your medications.    Please follow-up in clinic in 2 weeks for follow up of urinary symptoms and blood pressure.    Should you have any questions or concerns please call the internal medicine clinic at [phone].

## 2020-11-20 NOTE — Progress Notes (Signed)
Established Patient Office Visit  Subjective:  [name], female    DOB: 01-31-1961  Age: 59 y.o. MRN: [id number]  CC:  Chief Complaint  Patient presents with  . Hospitalization Follow-up  . Abdominal Pain    [name] for hospital follow up for dysuria, flank pain. Please refer to problem based charting for further details and assessment and plan of current problem and chronic medical conditions.   Past Medical History:  Diagnosis Date  . Cataract   . Chronic bronchitis ([place])    "I get it alot" (09/28/2013)  . Chronic diastolic heart failure (HCC)    grade 2 per 2D echocardiogram (01/2013)  . Chronic lower back pain   . Chronic pain syndrome 12/03/2011   Likely secondary to depression, "fibromyalgia", neuropathy, and obesity. Lumbar MRI 2014 no sig change from prior (2008) : Stable hypertrophic facet disease most notable at L4-5. Stable shallow left foraminal/extraforaminal disc protrusion at L4-5. No direct neural compression.      Marland Kitchen COPD 01/08/2007   PFT's 05/2007 : FEV1/FVC 82, FEV1 64% pred, FEF 25-75% 40% predicted, 16% improvement in FEV1 with bronchodilators.     . Diabetic peripheral neuropathy ([place])   . DVT of upper extremity (deep vein thrombosis) ([place]) 03/11/2013   Secondary to PICC line. Right brachial vein, diagnosed on 03/10/2013 Coumadin for 3 months. End date 06/10/2013   . Environmental allergies    Hx: of  . Fatty liver 2003   observed on ultrasound abdomen  . Fibromyalgia   . GERD (gastroesophageal reflux disease)   . Glaucoma   . History of bacterial endocarditis 2014   Endocarditis involving mitral and tricuspid valves.  S. Aureus and GBS.   Marland Kitchen History of use of hearing aid   . Hyperlipidemia   . Hyperplastic colon polyp 12/2010   Per colonoscopy (12/2010) - Dr. Deatra Ina  . Hypertension   . 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 PVD (peripheral vascular  disease) with claudication ([place])    Stents to bilateral common iliac arteries (left 2005, right 2008), on chronic plavix  . S/P BKA (below knee amputation) unilateral ([place])    2014 L - failed limb preserving treatment. 2/2 tobacco use, DM, and cont weight bearing on surgical wound and developed gangrene   . Tobacco abuse   . Type II diabetes mellitus with peripheral circulatory disorders, uncontrolled DX: 1993   Insulin dep. Poor control. Complicated by diabetic foot ulcer and diabetic eye disease.      Past Surgical History:  Procedure Laterality Date  . ABDOMINAL HYSTERECTOMY  1997   secondary to uterine fibroids  . AMPUTATION Left 08/31/2013   Procedure: AMPUTATION RAY;  Surgeon: Newt Minion, MD;  Location: [facility];  Service: Orthopedics;  Laterality: Left;  Left Foot 5th Ray Amputation  . AMPUTATION Left 09/28/2013   Procedure: Left Midfoot amputation;  Surgeon: Newt Minion, MD;  Location: [facility];  Service: Orthopedics;  Laterality: Left;  Left Midfoot amputation  . AMPUTATION Left 10/14/2013   Procedure: AMPUTATION BELOW KNEE- left;  Surgeon: Newt Minion, MD;  Location: [facility];  Service: Orthopedics;  Laterality: Left;  Left Below Knee Amputation   . AMPUTATION TOE Right 01/15/2017   Procedure: AMPUTATION 5th TOE RIGHT FOOT;  Surgeon: Edrick Kins, DPM;  Location: [facility];  Service: Podiatry;  Laterality: Right;  . APPLICATION OF WOUND VAC  04/01/2019  Procedure: Application Of Wound Vac;  Surgeon: Newt Minion, MD;  Location: [facility];  Service: Orthopedics;;  . BLADDER SURGERY     bladder reconstruction surgery  . BREAST BIOPSY     multiple-benign per pt  . COLONOSCOPY    . ESOPHAGOGASTRODUODENOSCOPY N/A 09/20/2013   Procedure: ESOPHAGOGASTRODUODENOSCOPY (EGD);  Surgeon: Jerene Bears, MD;  Location: [facility];  Service: Gastroenterology;  Laterality: N/A;  . FOOT AMPUTATION THROUGH METATARSAL Left 09/28/2013  . GANGLION CYST EXCISION     multiple  . PERIPHERAL VASCULAR  INTERVENTION     stents in lower ext  . SHOULDER ARTHROSCOPY Right 11/11/2019   RIGHT SHOULDER ARTHROSCOPY AND DEBRIDEMENT   . SHOULDER ARTHROSCOPY Right 11/11/2019   Procedure: RIGHT SHOULDER ARTHROSCOPY AND DEBRIDEMENT;  Surgeon: Newt Minion, MD;  Location: [facility];  Service: Orthopedics;  Laterality: Right;  . SHOULDER ARTHROSCOPY W/ ROTATOR CUFF REPAIR Bilateral   . SKIN SPLIT GRAFT Bilateral 05/13/2013   Procedure: Right and Left Foot Allograft Skin Graft;  Surgeon: Newt Minion, MD;  Location: [facility];  Service: Orthopedics;  Laterality: Bilateral;  Right and Left Foot Allograft Skin Graft  . STUMP REVISION Left 04/01/2019   Procedure: REVISION LEFT BELOW KNEE AMPUTATION;  Surgeon: Newt Minion, MD;  Location: [facility];  Service: Orthopedics;  Laterality: Left;  . TEE WITHOUT CARDIOVERSION N/A 01/31/2013   Procedure: TRANSESOPHAGEAL ECHOCARDIOGRAM (TEE);  Surgeon: Fay Records, MD;  Location: [facility];  Service: Cardiovascular;  Laterality: N/A;  Rm [phone]  . TEE WITHOUT CARDIOVERSION N/A 03/10/2013   Procedure: TRANSESOPHAGEAL ECHOCARDIOGRAM (TEE);  Surgeon: Larey Dresser, MD;  Location: [facility];  Service: Cardiovascular;  Laterality: N/A;  Rm. [phone]  . TOE AMPUTATION Left 08/31/2013   4TH & 5 TH TOE   . TONSILLECTOMY    . TUBAL LIGATION    . WRIST SURGERY Right    "for tumors" (09/28/2013)    Family History  Problem Relation Age of Onset  . Diverticulosis Mother   . Diabetes Mother   . Hypertension Mother   . Congestive Heart Failure Mother   . Asthma Father   . CAD Sister 57       MI at age 66 per patient.  However, she has not had a stent or CABG.   . Heart disease Sister        before age 35  . Breast cancer Neg Hx     Social History   Socioeconomic History  . Marital status: Divorced    Spouse name: Not on file  . Number of children: 2  . Years of education: college  . Highest education level: Not on file  Occupational History  . Occupation: Disability     Comment: previously worked as a Engineer, materials  . Smoking status: Current Every Day Smoker    Packs/day: 1.00    Years: 50.00    Pack years: 50.00    Types: Cigarettes  . Smokeless tobacco: Never Used  . Tobacco comment: 1 PPD, 03/02/20 states she is not interested in smoking cessation  Vaping Use  . Vaping Use: Never used  Substance and Sexual Activity  . Alcohol use: No    Alcohol/week: 0.0 standard drinks  . Drug use: No    Types: Marijuana, "Crack" cocaine    Comment: 09/28/2013 "no marijuana since 2011, no crack/cocaine 1989"  . Sexual activity: Not Currently  Other Topics Concern  . Not on file  Social History Narrative   On  disability. Lives with son in [place]. Formerly worked as Training and development officer.    Boyfriend passed away stage 4 cancer 04/10/13.   S/p L BKA 2014. In wheelchair in paritially suitable apartment.    Social Determinants of Health   Financial Resource Strain: High Risk  . Difficulty of Paying Living Expenses: Very hard  Food Insecurity: No Food Insecurity  . Worried About Charity fundraiser in the Last Year: Never true  . Ran Out of Food in the Last Year: Never true  Transportation Needs: No Transportation Needs  . Lack of Transportation (Medical): No  . Lack of Transportation (Non-Medical): No  Physical Activity:   . Days of Exercise per Week: Not on file  . Minutes of Exercise per Session: Not on file  Stress:   . Feeling of Stress : Not on file  Social Connections:   . Frequency of Communication with Friends and Family: Not on file  . Frequency of Social Gatherings with Friends and Family: Not on file  . Attends Religious Services: Not on file  . Active Member of Clubs or Organizations: Not on file  . Attends Archivist Meetings: Not on file  . Marital Status: Not on file  Intimate Partner Violence:   . Fear of Current or Ex-Partner: Not on file  . Emotionally Abused: Not on file  . Physically Abused: Not on file  . Sexually Abused: Not  on file    Outpatient Medications Prior to Visit  Medication Sig Dispense Refill  . albuterol (PROAIR HFA) 108 (90 Base) MCG/ACT inhaler INHALE 2 PUFFS BY MOUTH EVERY 6 HOURS AS NEEDED FOR WHEEZING (Patient taking differently: Inhale 1-2 puffs into the lungs every 6 (six) hours as needed for wheezing or shortness of breath. ) 54 g 3  . amLODipine (NORVASC) 10 MG tablet Take 1 tablet (10 mg total) by mouth daily. 90 tablet 3  . aspirin EC 81 MG tablet Take 1 tablet (81 mg total) by mouth daily. 30 tablet   . baclofen (LIORESAL) 10 MG tablet TAKE 1 TABLET BY MOUTH THREE TIMES A DAY AS NEEDED FOR MUSCLE SPASMS (Patient taking differently: Take 10 mg by mouth daily as needed. ) 90 tablet 2  . benazepril (LOTENSIN) 40 MG tablet Take 1 tablet (40 mg total) by mouth daily. 90 tablet 3  . Biotin 5 MG TABS Take 5 mg by mouth daily.    . Blood Glucose Monitoring Suppl (ONETOUCH VERIO FLEX SYSTEM) w/Device KIT Check 4 times a day 1 kit 1  . buPROPion (WELLBUTRIN XL) 300 MG 24 hr tablet Take 1 tablet (300 mg total) by mouth daily. 90 tablet 1  . cholecalciferol (VITAMIN D3) 25 MCG (1000 UT) tablet Take 1 tablet (1,000 Units total) by mouth daily. 30 tablet 0  . glucose blood (ONETOUCH VERIO) test strip 1 each by Other route as needed for other. Use as instructed. Tests 3-4 times daily. 100 each 3  . hydrochlorothiazide (HYDRODIURIL) 25 MG tablet Take 1 tablet (25 mg total) by mouth daily. 90 tablet 3  . hydrOXYzine (VISTARIL) 25 MG capsule Take 2 capsules (50 mg total) by mouth daily. 30 capsule 1  . insulin degludec (TRESIBA FLEXTOUCH) 200 UNIT/ML FlexTouch Pen Inject 70 Units into the skin daily. 135 mL 2  . insulin lispro (HUMALOG KWIKPEN) 100 UNIT/ML KwikPen IF BLOOD SUGAR IS < 175 DO NOT TAKE ANY CORRECTION INSULIN. 176-225 INJECT 2 UNITS. 226-275 INJECT 4 UNITS. 132-440 INJECT 6 UNITS. 326-375 INJECT 8 UNITS. 102-725  INJECT 10 UNITS. 426-475 INJECT 12 UNITS. 786-767 INJECT 14 UNITS AND CALL OFFICE FOR  FURTHER INSTRUCTIONS. (Patient taking differently: Inject 0-14 Units into the skin See admin instructions. Inject 0-14 units under the skin 3 times daily per sliding scale: if blood sugar < 175: 0 unit; 176-225: 2 units; 226-275: units; 276-325: 6 units; 326-375: 8 units;  376-425: 10 units; 426-475: 12 units; 476-525: 14 units and call office for further instructions) 9 mL 3  . Insulin Pen Needle 32G X 4 MM MISC Use to inject insulin 4 times a day and semaglutide once weekly 100 each 0  . Insulin Syringe-Needle U-100 31G X 15/64" 0.3 ML MISC Use with Humalog to correct blood sugar before meals three times a day 100 each 0  . Lancets (ONETOUCH ULTRASOFT) lancets Use as instructed 100 each 12  . metFORMIN (GLUCOPHAGE-XR) 500 MG 24 hr tablet 1 tablet daily with breakfast (Patient taking differently: Take 500 mg by mouth daily with breakfast. ) 90 tablet 3  . Multiple Vitamin (MULTIVITAMIN WITH MINERALS) TABS tablet Take 1 tablet by mouth daily.    Marland Kitchen nystatin (NYSTATIN) powder Apply 1 application topically 3 (three) times daily. 30 g 2  . omeprazole (PRILOSEC) 20 MG capsule TAKE 1 CAPSULE (20 MG TOTAL) BY MOUTH 2 (TWO) TIMES DAILY BEFORE A MEAL. 180 capsule 3  . oxybutynin (DITROPAN-XL) 5 MG 24 hr tablet Take 5 mg by mouth daily.    Marland Kitchen oxyCODONE-acetaminophen (PERCOCET/ROXICET) 5-325 MG tablet Take 1 tablet by mouth every 8 (eight) hours as needed. May take a 4th pill if severe pain (up to 10 days monthly) #100 equals 30 day supply 100 tablet 0  . OZEMPIC, 0.25 OR 0.5 MG/DOSE, 2 MG/1.5ML SOPN Inject 0.5 mg into the skin every Monday.     . phenazopyridine (PYRIDIUM) 200 MG tablet Take 1 tablet (200 mg total) by mouth 3 (three) times daily with meals. 10 tablet 0  . pregabalin (LYRICA) 200 MG capsule TAKE 1 CAPSULE BY MOUTH THREE TIMES A DAY (Patient taking differently: Take 200 mg by mouth 3 (three) times daily. ) 90 capsule 2  . rosuvastatin (CRESTOR) 20 MG tablet Take 1 tablet (20 mg total) by mouth at  bedtime. (Patient taking differently: Take 20 mg by mouth daily. ) 90 tablet 3  . umeclidinium-vilanterol (ANORO ELLIPTA) 62.5-25 MCG/INH AEPB Inhale 1 puff into the lungs daily. 90 each 3  . vitamin B-12 (CYANOCOBALAMIN) 500 MCG tablet Take 500 mcg by mouth daily.    . vitamin C (ASCORBIC ACID) 500 MG tablet Take 500 mg by mouth daily.     No facility-administered medications prior to visit.    Allergies  Allergen Reactions  . Abilify [Aripiprazole] Other (See Comments)    Urinary freq Nov 2016  . Iohexol Other (See Comments)    IV CONTRAST CAUSED TRANSIENT NEPHROPATHY (KIDNEY INSUFFICIENCY) IN 2007   . Ivp Dye [Iodinated Diagnostic Agents] Other (See Comments)    IV CONTRAST CAUSED TRANSIENT NEPHROPATHY (KIDNEY INSUFFICIENCY) IN 2007  . Morphine Sulfate Itching and Rash    ROS Review of Systems    Objective:    Physical Exam  BP (!) 157/62 (BP Location: Left Arm, Patient Position: Sitting, Cuff Size: Normal)   Pulse 84   Temp 98.1 F (36.7 C) (Oral)   Wt 261 lb 14.4 oz (118.8 kg)   SpO2 97%   BMI 41.02 kg/m  Wt Readings from Last 3 Encounters:  11/20/20 261 lb 14.4 oz (118.8 kg)  11/07/20 249  lb 5.4 oz (113.1 kg)  11/07/20 259 lb 3.2 oz (117.6 kg)   Constitutional: Obese lady, No acute distress.  Cardiovascular:  RRR, nl S1S2, no murmur Respiratory: Effort normal and breath sounds normal. No respiratory distress. No wheezes.  GI: Soft. Bowel sounds are normal. No distension. There is no tenderness.  GU: No CVA tenderness MSK: Left BKA Neurological: Is alert and oriented x 3 Skin: Not diaphoretic. No erythema.  Psychiatric: Normal mood and affect. Behavior is normal. Judgment and thought content normal.    Health Maintenance Due  Topic Date Due  . COVID-19 Vaccine (1) Never done  . PAP SMEAR-Modifier  08/17/2002  . OPHTHALMOLOGY EXAM  07/17/2017  . TETANUS/TDAP  07/15/2020    There are no preventive care reminders to display for this patient.  Lab  Results  Component Value Date   TSH 0.408 10/28/2020   Lab Results  Component Value Date   WBC 9.1 11/08/2020   HGB 10.8 (L) 11/08/2020   HCT 33.9 (L) 11/08/2020   MCV 96.6 11/08/2020   PLT 243 11/08/2020   Lab Results  Component Value Date   NA 141 11/08/2020   K 4.3 11/08/2020   CO2 27 11/08/2020   GLUCOSE 226 (H) 11/08/2020   BUN 16 11/08/2020   CREATININE 1.08 (H) 11/08/2020   BILITOT 0.2 (L) 11/07/2020   ALKPHOS 80 11/07/2020   AST 20 11/07/2020   ALT 28 11/07/2020   PROT 6.7 11/07/2020   ALBUMIN 3.1 (L) 11/07/2020   CALCIUM 8.7 (L) 11/08/2020   ANIONGAP 7 11/08/2020   Lab Results  Component Value Date   CHOL 111 10/25/2020   Lab Results  Component Value Date   HDL 47 10/25/2020   Lab Results  Component Value Date   LDLCALC 47 10/25/2020   Lab Results  Component Value Date   TRIG 87 10/25/2020   Lab Results  Component Value Date   CHOLHDL 2.4 10/25/2020   Lab Results  Component Value Date   HGBA1C 7.1 (A) 10/24/2020      Assessment & Plan:   Problem List Items Addressed This Visit      Other   Dysuria - Primary    Patient presented today for follow up of recent hospitalization for flank pain and dysuria. This has been going on for a while and she had 2 hospitalization this month for same symptoms (Admitted for11/17-11/18 for  Dysuria/ left flank Admitted from 11/7 to 11/10 with hypotension and met sepsis criteria, likely source from UTI), but her UA only showed few bacteria large leukocyte esterase. Her Urine culture with less than 10,000 bacteria. She received Abx but continued to have dysuria, suprapubic pain pain, and L flank pain.  At recent hospitalization, UA only showed mild pyuria. and CT abdomen was unrevealing. BC negative and UC un significant. Thought to have infectious source versus interstitial cystitis. She finished the course of PO AB and Azo. (-Received macrobid 168m twice daily for 5 days and AZO 2049mthree times daily for 2  days) Still has dysuria and frequency and no change in flank pain but her weakness is better.  Afebrile, no CVA tenderness on exam and POC UA today was unchanged. No further ABx today but she soul be evaluated by urology for further recommendations. Her kidney function was stable on recent hospitalization.  She was supposed to be seen by urologist. It has not happened yet.  Ms. GlRegin[facility]rology office. Pt has an appointment on 12/3 1 PM.  -Follow up  with [facility] 11/23/2020 1 pm (Pt is aware) -F/u in [facility] in 2-4 weeks to reassess and to make sure the referral and work up is being done or sooner as needed       Relevant Orders   POCT Urinalysis Dipstick (81002) (Completed)      No orders of the defined types were placed in this encounter. Patient discussed with Dr. Dareen Piano.  Follow-up: Return in about 2 weeks (around 12/04/2020) for f/u of urinary symptoms and DM.    Dewayne Hatch, MD

## 2020-11-21 MED ORDER — OZEMPIC (0.25 OR 0.5 MG/DOSE) 2 MG/1.5ML ~~LOC~~ SOPN: 0.5000 mg | PEN_INJECTOR | SUBCUTANEOUS | 5 refills | Status: DC

## 2020-11-26 ENCOUNTER — Ambulatory Visit (INDEPENDENT_AMBULATORY_CARE_PROVIDER_SITE_OTHER): Payer: Medicare Other | Admitting: Orthotics

## 2020-11-26 ENCOUNTER — Other Ambulatory Visit: Payer: Self-pay

## 2020-11-26 ENCOUNTER — Encounter: Payer: Self-pay | Admitting: Internal Medicine

## 2020-11-26 DIAGNOSIS — Z89512 Acquired absence of left leg below knee: Secondary | ICD-10-CM

## 2020-11-26 DIAGNOSIS — N3941 Urge incontinence: Secondary | ICD-10-CM

## 2020-11-26 DIAGNOSIS — E0843 Diabetes mellitus due to underlying condition with diabetic autonomic (poly)neuropathy: Secondary | ICD-10-CM | POA: Diagnosis not present

## 2020-11-26 DIAGNOSIS — L84 Corns and callosities: Secondary | ICD-10-CM

## 2020-11-26 NOTE — Progress Notes (Signed)
Internal Medicine Clinic Attending  Case discussed with Dr. Masoudi  At the time of the visit.  We reviewed the resident's history and exam and pertinent patient test results.  I agree with the assessment, diagnosis, and plan of care documented in the resident's note.  

## 2020-11-26 NOTE — Assessment & Plan Note (Signed)
Received copies of [facility] posterior tibial nerve stimulation treatments begun in August 2021 weekly, now twice a month, most recent 11/12/20.  Will not be scanned as content is purely documenting that the procedures were completed, with no mention of clinical outcomes

## 2020-11-26 NOTE — Progress Notes (Unsigned)
Received copies of [facility] posterior tibial nerve stimulation treatments begun in August 2021 weekly, now twice a month.  Will not be scanned as content is purely documenting that the procedures were completed, with no mention of clinical outcomes.

## 2020-11-26 NOTE — Progress Notes (Signed)

## 2020-11-27 ENCOUNTER — Telehealth: Payer: Medicare Other

## 2020-11-28 ENCOUNTER — Telehealth: Payer: Self-pay

## 2020-11-28 ENCOUNTER — Telehealth: Payer: Medicare Other

## 2020-11-28 NOTE — Telephone Encounter (Signed)
  Chronic Care Management   Outreach Note  11/28/2020 Name: Jennifer [name] MRN: [id number] DOB: 11-09-1961  Referred by: Angelica Pou, MD Reason for referral : Care Coordination (DME)   LEITH HEDLUND is enrolled in a Managed Medicaid Health Plan: No  An unsuccessful telephone outreach was attempted today. The patient was referred to the case management team for assistance with care management and care coordination.   Follow Up Plan: A HIPAA compliant phone message was left for the patient providing contact information and requesting a return call.       Ronn Melena, [place] Coordination Social Worker [place] [phone]

## 2020-12-01 ENCOUNTER — Telehealth: Payer: Self-pay | Admitting: Internal Medicine

## 2020-12-01 MED ORDER — BENZONATATE 100 MG PO CAPS: 100.0000 mg | ORAL_CAPSULE | Freq: Three times a day (TID) | ORAL | 1 refills | Status: AC | PRN

## 2020-12-01 NOTE — Telephone Encounter (Signed)
Patient called stating that she has had a cough for the pat two weeks without fever, chills headaches. She denies worsening SHOB. She states that her cough keeps her up at night. She denies any sick contacts and denies any recent travel.   Plan: Will prescribe Tessalon for cough   Lawerance Cruel, D.O.  Internal Medicine Resident, PGY-2 [facility]  Pager: [phone] 5:06 PM, 12/01/2020

## 2020-12-04 ENCOUNTER — Ambulatory Visit: Payer: Medicare Other

## 2020-12-04 DIAGNOSIS — E1151 Type 2 diabetes mellitus with diabetic peripheral angiopathy without gangrene: Secondary | ICD-10-CM

## 2020-12-04 DIAGNOSIS — IMO0002 Reserved for concepts with insufficient information to code with codable children: Secondary | ICD-10-CM

## 2020-12-04 DIAGNOSIS — E785 Hyperlipidemia, unspecified: Secondary | ICD-10-CM

## 2020-12-04 DIAGNOSIS — E1159 Type 2 diabetes mellitus with other circulatory complications: Secondary | ICD-10-CM

## 2020-12-04 DIAGNOSIS — E1169 Type 2 diabetes mellitus with other specified complication: Secondary | ICD-10-CM

## 2020-12-04 DIAGNOSIS — I152 Hypertension secondary to endocrine disorders: Secondary | ICD-10-CM

## 2020-12-04 NOTE — Progress Notes (Signed)
Internal Medicine Clinic Resident  I have personally reviewed this encounter including the documentation in this note and/or discussed this patient with the care management provider. I will address any urgent items identified by the care management provider and will communicate my actions to the patient's PCP. I have reviewed the patient's CCM visit with my supervising attending, Dr Rebeca Alert.  Mitzi Hansen, MD Internal Medicine Resident PGY-2 [facility] Pager: [phone] 12/04/2020 4:21 PM

## 2020-12-04 NOTE — Chronic Care Management (AMB) (Signed)
Chronic Care Management    Clinical Social Work Follow Up Note  12/04/2020 Name: Jennifer [name] MRN: [id number] DOB: 1961/06/05  Jennifer [name] is a 59 y.o. year old female who is a primary care patient of Angelica Pou, MD. The CCM team was consulted for assistance with [employer] .   Review of patient status, including review of consultants reports, other relevant assessments, and collaboration with appropriate care team members and the patient's provider was performed as part of comprehensive patient evaluation and provision of chronic care management services.    SDOH (Social Determinants of Health) assessments performed: No    Outpatient Encounter Medications as of 12/04/2020  Medication Sig Note  . albuterol (PROAIR HFA) 108 (90 Base) MCG/ACT inhaler INHALE 2 PUFFS BY MOUTH EVERY 6 HOURS AS NEEDED FOR WHEEZING (Patient taking differently: Inhale 1-2 puffs into the lungs every 6 (six) hours as needed for wheezing or shortness of breath. )   . amLODipine (NORVASC) 10 MG tablet Take 1 tablet (10 mg total) by mouth daily.   Marland Kitchen aspirin EC 81 MG tablet Take 1 tablet (81 mg total) by mouth daily.   . baclofen (LIORESAL) 10 MG tablet TAKE 1 TABLET BY MOUTH THREE TIMES A DAY AS NEEDED FOR MUSCLE SPASMS (Patient taking differently: Take 10 mg by mouth daily as needed. )   . benazepril (LOTENSIN) 40 MG tablet Take 1 tablet (40 mg total) by mouth daily.   . benzonatate (TESSALON PERLES) 100 MG capsule Take 1 capsule (100 mg total) by mouth 3 (three) times daily as needed for up to 7 days for cough.   . Biotin 5 MG TABS Take 5 mg by mouth daily.   . Blood Glucose Monitoring Suppl ([place]) w/Device KIT Check 4 times a day   . buPROPion (WELLBUTRIN XL) 300 MG 24 hr tablet Take 1 tablet (300 mg total) by mouth daily.   . cholecalciferol (VITAMIN D3) 25 MCG (1000 UT) tablet Take 1 tablet (1,000 Units total) by mouth daily.   Marland Kitchen glucose blood (ONETOUCH VERIO) test  strip 1 each by Other route as needed for other. Use as instructed. Tests 3-4 times daily.   . hydrochlorothiazide (HYDRODIURIL) 25 MG tablet Take 1 tablet (25 mg total) by mouth daily.   . hydrOXYzine (VISTARIL) 25 MG capsule Take 2 capsules (50 mg total) by mouth daily.   . insulin degludec (TRESIBA FLEXTOUCH) 200 UNIT/ML FlexTouch Pen Inject 70 Units into the skin daily.   . insulin lispro (HUMALOG KWIKPEN) 100 UNIT/ML KwikPen IF BLOOD SUGAR IS < 175 DO NOT TAKE ANY CORRECTION INSULIN. 176-225 INJECT 2 UNITS. 226-275 INJECT 4 UNITS. 998-338 INJECT 6 UNITS. 326-375 INJECT 8 UNITS. 250-539 INJECT 10 UNITS. 426-475 INJECT 12 UNITS. 767-341 INJECT 14 UNITS AND CALL OFFICE FOR FURTHER INSTRUCTIONS. (Patient taking differently: Inject 0-14 Units into the skin See admin instructions. Inject 0-14 units under the skin 3 times daily per sliding scale: if blood sugar < 175: 0 unit; 176-225: 2 units; 226-275: units; 276-325: 6 units; 326-375: 8 units;  376-425: 10 units; 426-475: 12 units; 476-525: 14 units and call office for further instructions) 10/29/2020: Per patient, she injects Humalog 14-30 units 2-4 times daily due to BG >400 mg/dl  . Insulin Pen Needle 32G X 4 MM MISC Use to inject insulin 4 times a day and semaglutide once weekly   . Insulin Syringe-Needle U-100 31G X 15/64" 0.3 ML MISC Use with Humalog to correct blood sugar before meals  three times a day   . Lancets (ONETOUCH ULTRASOFT) lancets Use as instructed   . metFORMIN (GLUCOPHAGE-XR) 500 MG 24 hr tablet 1 tablet daily with breakfast (Patient taking differently: Take 500 mg by mouth daily with breakfast. )   . Multiple Vitamin (MULTIVITAMIN WITH MINERALS) TABS tablet Take 1 tablet by mouth daily.   Marland Kitchen nystatin (NYSTATIN) powder Apply 1 application topically 3 (three) times daily.   Marland Kitchen omeprazole (PRILOSEC) 20 MG capsule TAKE 1 CAPSULE (20 MG TOTAL) BY MOUTH 2 (TWO) TIMES DAILY BEFORE A MEAL.   Marland Kitchen oxybutynin (DITROPAN-XL) 5 MG 24 hr tablet Take 5  mg by mouth daily.   Marland Kitchen oxyCODONE-acetaminophen (PERCOCET/ROXICET) 5-325 MG tablet Take 1 tablet by mouth every 8 (eight) hours as needed. May take a 4th pill if severe pain (up to 10 days monthly) #100 equals 30 day supply   . OZEMPIC, 0.25 OR 0.5 MG/DOSE, 2 MG/1.5ML SOPN Inject 0.5 mg into the skin every Monday for 24 doses.   . phenazopyridine (PYRIDIUM) 200 MG tablet Take 1 tablet (200 mg total) by mouth 3 (three) times daily with meals.   . pregabalin (LYRICA) 200 MG capsule TAKE 1 CAPSULE BY MOUTH THREE TIMES A DAY (Patient taking differently: Take 200 mg by mouth 3 (three) times daily. )   . rosuvastatin (CRESTOR) 20 MG tablet Take 1 tablet (20 mg total) by mouth at bedtime. (Patient taking differently: Take 20 mg by mouth daily. )   . umeclidinium-vilanterol (ANORO ELLIPTA) 62.5-25 MCG/INH AEPB Inhale 1 puff into the lungs daily.   . vitamin B-12 (CYANOCOBALAMIN) 500 MCG tablet Take 500 mcg by mouth daily.   . vitamin C (ASCORBIC ACID) 500 MG tablet Take 500 mg by mouth daily.   . [DISCONTINUED] Potassium Chloride ER 20 MEQ TBCR Take 40 mEq by mouth daily.    No facility-administered encounter medications on file as of 12/04/2020.     Goals Addressed              This Visit's Progress   .  COMPLETED: "I need a power wheelchair" (pt-stated)        [place] (see longtitudinal plan of care for additional care plan information)  Current Barriers:  . ADL IADL limitations  Patient previously approved for power wheelchair after appeal was approved by [employer].  Patient did not obtain chair, however, because of the out-of-pocket cost that was quoted to her by [facility].  Patient does have Medicaid in addition to Medicare . 08/29/20:  Patient was gifted a power wheelchair but states that it is in need of repair.    Clinical Social Work Clinical Goal(s):  Marland Kitchen Over the next 30 days, patient will work with SW to address concerns related to need for DME; power  wheelchair  Interventions: . Patient has been provided with contact information for [employer] [phone]. [phone])   . Patient Self Care Activities:  . Performs ADL's independently . Unable to perform IADLs independently  Please see past updates related to this goal by clicking on the "Past Updates" button in the selected goal          Contacted patient today for update on wheelchair.  Stated she was headed to an appointment and would call back.     Follow Up Plan: No further SW follow up at this time.     SIGNATURE

## 2020-12-04 NOTE — Patient Instructions (Signed)
Licensed Clinical Social Worker Visit Information  Goals we discussed today:  Goals Addressed              This Visit's Progress   .  COMPLETED: "I need a power wheelchair" (pt-stated)        [name] (see longtitudinal plan of care for additional care plan information)  Current Barriers:  . ADL IADL limitations  Patient previously approved for power wheelchair after appeal was approved by [employer].  Patient did not obtain chair, however, because of the out-of-pocket cost that was quoted to her by [facility].  Patient does have Medicaid in addition to Medicare . 08/29/20:  Patient was gifted a power wheelchair but states that it is in need of repair.    Clinical Social Work Clinical Goal(s):  Marland Kitchen Over the next 30 days, patient will work with SW to address concerns related to need for DME; power wheelchair  Interventions: . Patient has been provided with contact information for [employer] [phone]. [phone])   . Patient Self Care Activities:  . Performs ADL's independently . Unable to perform IADLs independently  Please see past updates related to this goal by clicking on the "Past Updates" button in the selected goal           Follow Up Plan: No further SW follow up at this time.       Ronn Melena, [place] Coordination Social Worker [place] [phone]

## 2020-12-05 ENCOUNTER — Other Ambulatory Visit: Payer: Self-pay | Admitting: *Deleted

## 2020-12-05 ENCOUNTER — Other Ambulatory Visit: Payer: Self-pay

## 2020-12-05 MED ORDER — OXYCODONE-ACETAMINOPHEN 5-325 MG PO TABS: ORAL_TABLET | ORAL | 0 refills | Status: DC

## 2020-12-05 MED ORDER — NYSTATIN 100000 UNIT/GM EX POWD: 1.0000 "application " | Freq: Three times a day (TID) | CUTANEOUS | 2 refills | Status: DC

## 2020-12-05 NOTE — Progress Notes (Signed)
[facility] Medicine Clinic Attending  CCM services provided by the care management provider and their documentation were reviewed with Dr. Christian.  We reviewed the pertinent findings, urgent action items addressed by the resident and non-urgent items to be addressed by the PCP.  I agree with the assessment, diagnosis, and plan of care documented in the CCM and resident's note.  Layman Gully N Surya Folden, MD 12/05/2020  

## 2020-12-05 NOTE — Telephone Encounter (Signed)
oxyCODONE-acetaminophen (PERCOCET/ROXICET) 5-325 MG tablet, REFILL REQUEST @  [id number] - [place], [place] - [address] Phone:  [phone]  Fax:  [phone]     

## 2020-12-06 ENCOUNTER — Telehealth: Payer: Medicare Other

## 2020-12-06 ENCOUNTER — Telehealth: Payer: Self-pay | Admitting: *Deleted

## 2020-12-06 NOTE — Telephone Encounter (Signed)
  Chronic Care Management   Outreach Note  12/06/2020 Name: Jennifer [name] MRN: [id number] DOB: [birth year]  Referred by: Angelica Pou, MD Reason for referral : No chief complaint on file.   Jennifer [name] is enrolled in a Managed Medicaid Health Plan: No  Reached patient via phone but she was in care driving and requested return call after 15 minutes. Second call placed to patient 30 later; call  went to voice mail. A HIPAA compliant phone message was left for the patient providing contact information and requesting a return call.   The patient was referred to the case management team for assistance with care management and care coordination.   Follow Up Plan: The care management team will reach out to the patient again over the next 7-14 days.    Kelli Churn RN, CCM, [facility] RN Care Manager [phone]

## 2020-12-18 ENCOUNTER — Telehealth: Payer: Medicare Other

## 2020-12-18 ENCOUNTER — Telehealth: Payer: Self-pay | Admitting: *Deleted

## 2020-12-18 NOTE — Telephone Encounter (Signed)
  Chronic Care Management   Outreach Note  12/18/2020 Name: Jennifer [name] MRN: [id number] DOB: 1961/04/11  Referred by: Miguel Aschoff, MD Reason for referral : Chronic Care Management (IDDM, HF, HTN, COPD, Asthma, CKD)   Jennifer [name] is enrolled in a Managed Medicaid Health Plan: No  A second unsuccessful telephone outreach was attempted today. The patient was referred to the case management team for assistance with care management and care coordination.   Follow Up Plan: A HIPAA compliant phone message was left for the patient providing contact information and requesting a return call.  The care management team will reach out to the patient again over the next 7-14 days.   Cranford Mon RN, CCM, [facility] RN Care Manager [phone]

## 2020-12-22 HISTORY — PX: EYE SURGERY: SHX253

## 2020-12-25 ENCOUNTER — Ambulatory Visit: Payer: Medicare Other | Admitting: *Deleted

## 2020-12-25 DIAGNOSIS — E1159 Type 2 diabetes mellitus with other circulatory complications: Secondary | ICD-10-CM

## 2020-12-25 DIAGNOSIS — E785 Hyperlipidemia, unspecified: Secondary | ICD-10-CM

## 2020-12-25 DIAGNOSIS — I152 Hypertension secondary to endocrine disorders: Secondary | ICD-10-CM

## 2020-12-25 DIAGNOSIS — E1151 Type 2 diabetes mellitus with diabetic peripheral angiopathy without gangrene: Secondary | ICD-10-CM

## 2020-12-25 DIAGNOSIS — N3941 Urge incontinence: Secondary | ICD-10-CM

## 2020-12-25 DIAGNOSIS — E1169 Type 2 diabetes mellitus with other specified complication: Secondary | ICD-10-CM

## 2020-12-25 DIAGNOSIS — IMO0002 Reserved for concepts with insufficient information to code with codable children: Secondary | ICD-10-CM

## 2020-12-25 NOTE — Chronic Care Management (AMB) (Signed)
  Chronic Care Management   Outreach Note  12/25/2020 Name: Jennifer [name] MRN: [id number] DOB: [birth year]  Referred by: Miguel Aschoff, MD Reason for referral : Chronic Care Management (IDDM, HF, HTN, COPD, Asthma, CKD, OAB (overactive bladder) )   [name] is enrolled in a Managed Medicaid Health Plan: No  Third unsuccessful telephone outreach was attempted today. The patient was referred to the case management team for assistance with care management and care coordination. The patient's primary care provider has been notified of our unsuccessful attempts to make or maintain contact with the patient. The care management team is pleased to engage with this patient at any time in the future should he/she be interested in assistance from the care management team.   Follow Up Plan: No further follow up required: unable to maintain contact with patient.   Cranford Mon RN, CCM, [facility] RN Care Manager [phone]

## 2020-12-26 ENCOUNTER — Other Ambulatory Visit: Payer: Self-pay | Admitting: Internal Medicine

## 2021-01-02 NOTE — Progress Notes (Incomplete)
Established Patient Office Visit  Subjective:  [name], female    DOB: 1961/04/25  Age: 60 y.o. MRN: [id number]  CC: No chief complaint on file.   [name] for ***  Past Medical History:  Diagnosis Date  . Cataract   . Chronic bronchitis ([place])    "I get it alot" (09/28/2013)  . Chronic diastolic heart failure (HCC)    grade 2 per 2D echocardiogram (01/2013)  . Chronic lower back pain   . Chronic pain syndrome 12/03/2011   Likely secondary to depression, "fibromyalgia", neuropathy, and obesity. Lumbar MRI 2014 no sig change from prior (2008) : Stable hypertrophic facet disease most notable at L4-5. Stable shallow left foraminal/extraforaminal disc protrusion at L4-5. No direct neural compression.      Marland Kitchen COPD 01/08/2007   PFT's 05/2007 : FEV1/FVC 82, FEV1 64% pred, FEF 25-75% 40% predicted, 16% improvement in FEV1 with bronchodilators.     . Diabetic peripheral neuropathy ([place])   . DVT of upper extremity (deep vein thrombosis) ([place]) 03/11/2013   Secondary to PICC line. Right brachial vein, diagnosed on 03/10/2013 Coumadin for 3 months. End date 06/10/2013   . Environmental allergies    Hx: of  . Fatty liver 2003   observed on ultrasound abdomen  . Fibromyalgia   . GERD (gastroesophageal reflux disease)   . Glaucoma   . History of bacterial endocarditis 2014   Endocarditis involving mitral and tricuspid valves.  S. [name] and Jennifer [name].   Marland Kitchen History of use of hearing aid   . Hyperlipidemia   . Hyperplastic colon polyp 12/2010   Per colonoscopy (12/2010) - Jennifer [name]  . Hypertension   . 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 PVD (peripheral vascular disease) with claudication ([place])    Stents to bilateral common iliac arteries (left 2005, right 2008), on chronic plavix  . S/P BKA (below knee amputation) unilateral ([place])    2014 L - failed limb preserving treatment. 2/2 tobacco use,  DM, and cont weight bearing on surgical wound and developed gangrene   . Tobacco abuse   . Type II diabetes mellitus with peripheral circulatory disorders, uncontrolled DX: 1993   Insulin dep. Poor control. Complicated by diabetic foot ulcer and diabetic eye disease.      Past Surgical History:  Procedure Laterality Date  . ABDOMINAL HYSTERECTOMY  1997   secondary to uterine fibroids  . AMPUTATION Left 08/31/2013   Procedure: AMPUTATION RAY;  Surgeon: Jennifer Minion, MD;  Location: [facility];  Service: Orthopedics;  Laterality: Left;  Left Foot 5th Ray Amputation  . AMPUTATION Left 09/28/2013   Procedure: Left Midfoot amputation;  Surgeon: Jennifer Minion, MD;  Location: [facility];  Service: Orthopedics;  Laterality: Left;  Left Midfoot amputation  . AMPUTATION Left 10/14/2013   Procedure: AMPUTATION BELOW KNEE- left;  Surgeon: Jennifer Minion, MD;  Location: [facility];  Service: Orthopedics;  Laterality: Left;  Left Below Knee Amputation   . AMPUTATION TOE Right 01/15/2017   Procedure: AMPUTATION 5th TOE RIGHT FOOT;  Surgeon: Jennifer [name], DPM;  Location: [facility];  Service: Podiatry;  Laterality: Right;  . APPLICATION OF WOUND VAC  04/01/2019   Procedure: Application Of Wound Vac;  Surgeon: Jennifer Minion, MD;  Location: [facility];  Service: Orthopedics;;  . BLADDER SURGERY     bladder reconstruction surgery  . BREAST BIOPSY     multiple-benign  per pt  . COLONOSCOPY    . ESOPHAGOGASTRODUODENOSCOPY N/A 09/20/2013   Procedure: ESOPHAGOGASTRODUODENOSCOPY (EGD);  Surgeon: Jennifer Bears, MD;  Location: [facility];  Service: Gastroenterology;  Laterality: N/A;  . FOOT AMPUTATION THROUGH METATARSAL Left 09/28/2013  . GANGLION CYST EXCISION     multiple  . PERIPHERAL VASCULAR INTERVENTION     stents in lower ext  . SHOULDER ARTHROSCOPY Right 11/11/2019   RIGHT SHOULDER ARTHROSCOPY AND DEBRIDEMENT   . SHOULDER ARTHROSCOPY Right 11/11/2019   Procedure: RIGHT SHOULDER ARTHROSCOPY AND DEBRIDEMENT;  Surgeon: Jennifer Minion, MD;  Location: [facility];  Service: Orthopedics;  Laterality: Right;  . SHOULDER ARTHROSCOPY W/ ROTATOR CUFF REPAIR Bilateral   . SKIN SPLIT GRAFT Bilateral 05/13/2013   Procedure: Right and Left Foot Allograft Skin Graft;  Surgeon: Jennifer Minion, MD;  Location: [facility];  Service: Orthopedics;  Laterality: Bilateral;  Right and Left Foot Allograft Skin Graft  . STUMP REVISION Left 04/01/2019   Procedure: REVISION LEFT BELOW KNEE AMPUTATION;  Surgeon: Jennifer Minion, MD;  Location: [facility];  Service: Orthopedics;  Laterality: Left;  . TEE WITHOUT CARDIOVERSION N/A 01/31/2013   Procedure: TRANSESOPHAGEAL ECHOCARDIOGRAM (TEE);  Surgeon: Jennifer Records, MD;  Location: [facility];  Service: Cardiovascular;  Laterality: N/A;  Rm [phone]  . TEE WITHOUT CARDIOVERSION N/A 03/10/2013   Procedure: TRANSESOPHAGEAL ECHOCARDIOGRAM (TEE);  Surgeon: Jennifer Dresser, MD;  Location: [facility];  Service: Cardiovascular;  Laterality: N/A;  Rm. [id number]  . TOE AMPUTATION Left 08/31/2013   4TH & 5 TH TOE   . TONSILLECTOMY    . TUBAL LIGATION    . WRIST SURGERY Right    "for tumors" (09/28/2013)    Family History  Problem Relation Age of Onset  . Diverticulosis Mother   . Diabetes Mother   . Hypertension Mother   . Congestive Heart Failure Mother   . Asthma Father   . CAD Sister 26       MI at age 16 per patient.  However, she has not had a stent or CABG.   . Heart disease Sister        before age 73  . Breast cancer Neg Hx     Social History   Socioeconomic History  . Marital status: Divorced    Spouse name: Not on file  . Number of children: 2  . Years of education: college  . Highest education level: Not on file  Occupational History  . Occupation: Disability    Comment: previously worked as a Engineer, materials  . Smoking status: Current Every Day Smoker    Packs/day: 1.00    Years: 50.00    Pack years: 50.00    Types: Cigarettes  . Smokeless tobacco: Never Used  . Tobacco comment: 1 PPD,  03/02/20 states she is not interested in smoking cessation  Vaping Use  . Vaping Use: Never used  Substance and Sexual Activity  . Alcohol use: No    Alcohol/week: 0.0 standard drinks  . Drug use: No    Types: Marijuana, "Crack" cocaine    Comment: 09/28/2013 "no marijuana since 2011, no crack/cocaine 1989"  . Sexual activity: Not Currently  Other Topics Concern  . Not on file  Social History Narrative   On disability. Lives with son in [place]. Formerly worked as Training and development officer.    Boyfriend passed away stage 4 cancer 03-29-13.   S/p L BKA 2014. In wheelchair in paritially suitable apartment.    Social Determinants  of Health   Financial Resource Strain: High Risk  . Difficulty of Paying Living Expenses: Very hard  Food Insecurity: No Food Insecurity  . Worried About Charity fundraiser in the Last Year: Never true  . Ran Out of Food in the Last Year: Never true  Transportation Needs: No Transportation Needs  . Lack of Transportation (Medical): No  . Lack of Transportation (Non-Medical): No  Physical Activity: Not on file  Stress: Not on file  Social Connections: Not on file  Intimate Partner Violence: Not on file    Outpatient Medications Prior to Visit  Medication Sig Dispense Refill  . albuterol (PROAIR HFA) 108 (90 Base) MCG/ACT inhaler INHALE 2 PUFFS BY MOUTH EVERY 6 HOURS AS NEEDED FOR WHEEZING (Patient taking differently: Inhale 1-2 puffs into the lungs every 6 (six) hours as needed for wheezing or shortness of breath. ) 54 g 3  . amLODipine (NORVASC) 10 MG tablet Take 1 tablet (10 mg total) by mouth daily. 90 tablet 3  . aspirin EC 81 MG tablet Take 1 tablet (81 mg total) by mouth daily. 30 tablet   . baclofen (LIORESAL) 10 MG tablet TAKE 1 TABLET BY MOUTH THREE TIMES A DAY AS NEEDED FOR MUSCLE SPASMS (Patient taking differently: Take 10 mg by mouth daily as needed. ) 90 tablet 2  . benazepril (LOTENSIN) 40 MG tablet Take 1 tablet (40 mg total) by mouth daily. 90 tablet 3  .  Biotin 5 MG TABS Take 5 mg by mouth daily.    . Blood Glucose Monitoring Suppl (ONETOUCH VERIO FLEX SYSTEM) w/Device KIT Check 4 times a day 1 kit 1  . buPROPion (WELLBUTRIN XL) 300 MG 24 hr tablet Take 1 tablet (300 mg total) by mouth daily. 90 tablet 1  . cholecalciferol (VITAMIN D3) 25 MCG (1000 UT) tablet Take 1 tablet (1,000 Units total) by mouth daily. 30 tablet 0  . glucose blood (ONETOUCH VERIO) test strip 1 each by Other route as needed for other. Use as instructed. Tests 3-4 times daily. 100 each 3  . hydrochlorothiazide (HYDRODIURIL) 25 MG tablet Take 1 tablet (25 mg total) by mouth daily. 90 tablet 3  . hydrOXYzine (VISTARIL) 25 MG capsule Take 2 capsules (50 mg total) by mouth daily. 30 capsule 1  . insulin degludec (TRESIBA FLEXTOUCH) 200 UNIT/ML FlexTouch Pen Inject 70 Units into the skin daily. 135 mL 2  . insulin lispro (HUMALOG KWIKPEN) 100 UNIT/ML KwikPen IF BLOOD SUGAR IS < 175 DO NOT TAKE ANY CORRECTION INSULIN. 176-225 INJECT 2 UNITS. 226-275 INJECT 4 UNITS. 211-155 INJECT 6 UNITS. 326-375 INJECT 8 UNITS. 208-022 INJECT 10 UNITS. 426-475 INJECT 12 UNITS. 336-122 INJECT 14 UNITS AND CALL OFFICE FOR FURTHER INSTRUCTIONS. (Patient taking differently: Inject 0-14 Units into the skin See admin instructions. Inject 0-14 units under the skin 3 times daily per sliding scale: if blood sugar < 175: 0 unit; 176-225: 2 units; 226-275: units; 276-325: 6 units; 326-375: 8 units;  376-425: 10 units; 426-475: 12 units; 476-525: 14 units and call office for further instructions) 9 mL 3  . Insulin Pen Needle 32G X 4 MM MISC Use to inject insulin 4 times a day and semaglutide once weekly 100 each 0  . Insulin Syringe-Needle U-100 31G X 15/64" 0.3 ML MISC Use with Humalog to correct blood sugar before meals three times a day 100 each 0  . Lancets (ONETOUCH ULTRASOFT) lancets Use as instructed 100 each 12  . metFORMIN (GLUCOPHAGE-XR) 500 MG 24 hr tablet  1 tablet daily with breakfast (Patient taking  differently: Take 500 mg by mouth daily with breakfast. ) 90 tablet 3  . Multiple Vitamin (MULTIVITAMIN WITH MINERALS) TABS tablet Take 1 tablet by mouth daily.    Marland Kitchen nystatin (NYSTATIN) powder Apply 1 application topically 3 (three) times daily. 30 g 2  . omeprazole (PRILOSEC) 20 MG capsule TAKE 1 CAPSULE (20 MG TOTAL) BY MOUTH 2 (TWO) TIMES DAILY BEFORE A MEAL. 180 capsule 3  . oxybutynin (DITROPAN-XL) 5 MG 24 hr tablet Take 5 mg by mouth daily.    Marland Kitchen oxyCODONE-acetaminophen (PERCOCET/ROXICET) 5-325 MG tablet Take 1 tablet by mouth every 8 (eight) hours as needed. May take a 4th pill if severe pain (up to 10 days monthly) #100 equals 30 day supply 100 tablet 0  . OZEMPIC, 0.25 OR 0.5 MG/DOSE, 2 MG/1.5ML SOPN Inject 0.5 mg into the skin every Monday for 24 doses. 1.5 mL 5  . phenazopyridine (PYRIDIUM) 200 MG tablet Take 1 tablet (200 mg total) by mouth 3 (three) times daily with meals. 10 tablet 0  . pregabalin (LYRICA) 200 MG capsule TAKE 1 CAPSULE BY MOUTH THREE TIMES A DAY (Patient taking differently: Take 200 mg by mouth 3 (three) times daily. ) 90 capsule 2  . rosuvastatin (CRESTOR) 20 MG tablet Take 1 tablet (20 mg total) by mouth at bedtime. (Patient taking differently: Take 20 mg by mouth daily. ) 90 tablet 3  . umeclidinium-vilanterol (ANORO ELLIPTA) 62.5-25 MCG/INH AEPB Inhale 1 puff into the lungs daily. 90 each 3  . vitamin B-12 (CYANOCOBALAMIN) 500 MCG tablet Take 500 mcg by mouth daily.    . vitamin C (ASCORBIC ACID) 500 MG tablet Take 500 mg by mouth daily.     No facility-administered medications prior to visit.    Allergies  Allergen Reactions  . Abilify [Aripiprazole] Other (See Comments)    Urinary freq Nov 2016  . Iohexol Other (See Comments)    IV CONTRAST CAUSED TRANSIENT NEPHROPATHY (KIDNEY INSUFFICIENCY) IN 2007   . Ivp Dye [Iodinated Diagnostic Agents] Other (See Comments)    IV CONTRAST CAUSED TRANSIENT NEPHROPATHY (KIDNEY INSUFFICIENCY) IN 2007  . Morphine Sulfate  Itching and Rash    ROS Review of Systems    Objective:    Physical Exam  There were no vitals taken for this visit. Wt Readings from Last 3 Encounters:  11/20/20 261 lb 14.4 oz (118.8 kg)  11/07/20 249 lb 5.4 oz (113.1 kg)  11/07/20 259 lb 3.2 oz (117.6 kg)     Health Maintenance Due  Topic Date Due  . COVID-19 Vaccine (1) Never done  . PAP SMEAR-Modifier  08/17/2002  . OPHTHALMOLOGY EXAM  07/17/2017  . TETANUS/TDAP  07/15/2020    There are no preventive care reminders to display for this patient.  Lab Results  Component Value Date   TSH 0.408 10/28/2020   Lab Results  Component Value Date   WBC 9.1 11/08/2020   HGB 10.8 (L) 11/08/2020   HCT 33.9 (L) 11/08/2020   MCV 96.6 11/08/2020   PLT 243 11/08/2020   Lab Results  Component Value Date   NA 141 11/08/2020   K 4.3 11/08/2020   CO2 27 11/08/2020   GLUCOSE 226 (H) 11/08/2020   BUN 16 11/08/2020   CREATININE 1.08 (H) 11/08/2020   BILITOT 0.2 (L) 11/07/2020   ALKPHOS 80 11/07/2020   AST 20 11/07/2020   ALT 28 11/07/2020   PROT 6.7 11/07/2020   ALBUMIN 3.1 (L) 11/07/2020   CALCIUM  8.7 (L) 11/08/2020   ANIONGAP 7 11/08/2020   Lab Results  Component Value Date   CHOL 111 10/25/2020   Lab Results  Component Value Date   HDL 47 10/25/2020   Lab Results  Component Value Date   LDLCALC 47 10/25/2020   Lab Results  Component Value Date   TRIG 87 10/25/2020   Lab Results  Component Value Date   CHOLHDL 2.4 10/25/2020   Lab Results  Component Value Date   HGBA1C 7.1 (A) 10/24/2020      Assessment & Plan:   Problem List Items Addressed This Visit   None     No orders of the defined types were placed in this encounter.   Follow-up: No follow-ups on file.    Angelica Pou, MD

## 2021-01-03 ENCOUNTER — Encounter: Payer: Medicare Other | Admitting: Internal Medicine

## 2021-01-04 ENCOUNTER — Other Ambulatory Visit: Payer: Self-pay | Admitting: *Deleted

## 2021-01-04 ENCOUNTER — Other Ambulatory Visit: Payer: Self-pay | Admitting: Internal Medicine

## 2021-01-04 MED ORDER — OXYCODONE-ACETAMINOPHEN 5-325 MG PO TABS: 1.0000 | ORAL_TABLET | Freq: Three times a day (TID) | ORAL | 0 refills | Status: DC | PRN

## 2021-01-04 MED ORDER — OXYCODONE-ACETAMINOPHEN 5-325 MG PO TABS: ORAL_TABLET | ORAL | 0 refills | Status: DC

## 2021-01-04 NOTE — Progress Notes (Signed)
Sequential 1 month prescriptions for oxycodone-APAP written for each of 3 months.

## 2021-01-09 ENCOUNTER — Telehealth: Payer: Self-pay | Admitting: *Deleted

## 2021-01-09 NOTE — Telephone Encounter (Signed)
Pt had called and left message on the lab vm - I called pt, no answer. Left message to call the office.

## 2021-01-15 DIAGNOSIS — E1142 Type 2 diabetes mellitus with diabetic polyneuropathy: Secondary | ICD-10-CM | POA: Insufficient documentation

## 2021-01-16 ENCOUNTER — Other Ambulatory Visit: Payer: Self-pay | Admitting: Pain Medicine

## 2021-01-16 DIAGNOSIS — M546 Pain in thoracic spine: Secondary | ICD-10-CM

## 2021-01-16 DIAGNOSIS — M5416 Radiculopathy, lumbar region: Secondary | ICD-10-CM

## 2021-01-17 ENCOUNTER — Encounter: Payer: Self-pay | Admitting: Internal Medicine

## 2021-01-17 ENCOUNTER — Ambulatory Visit (INDEPENDENT_AMBULATORY_CARE_PROVIDER_SITE_OTHER): Payer: Medicare Other | Admitting: Internal Medicine

## 2021-01-17 ENCOUNTER — Other Ambulatory Visit: Payer: Self-pay | Admitting: *Deleted

## 2021-01-17 ENCOUNTER — Other Ambulatory Visit: Payer: Self-pay

## 2021-01-17 VITALS — BP 160/96 | HR 52 | Temp 95.5°F | Ht 67.0 in | Wt 265.5 lb

## 2021-01-17 DIAGNOSIS — M545 Low back pain, unspecified: Secondary | ICD-10-CM | POA: Diagnosis not present

## 2021-01-17 DIAGNOSIS — E1165 Type 2 diabetes mellitus with hyperglycemia: Secondary | ICD-10-CM | POA: Diagnosis not present

## 2021-01-17 DIAGNOSIS — E1121 Type 2 diabetes mellitus with diabetic nephropathy: Secondary | ICD-10-CM

## 2021-01-17 DIAGNOSIS — J42 Unspecified chronic bronchitis: Secondary | ICD-10-CM

## 2021-01-17 DIAGNOSIS — Z89512 Acquired absence of left leg below knee: Secondary | ICD-10-CM

## 2021-01-17 DIAGNOSIS — M7712 Lateral epicondylitis, left elbow: Secondary | ICD-10-CM

## 2021-01-17 DIAGNOSIS — M62838 Other muscle spasm: Secondary | ICD-10-CM

## 2021-01-17 DIAGNOSIS — IMO0002 Reserved for concepts with insufficient information to code with codable children: Secondary | ICD-10-CM

## 2021-01-17 DIAGNOSIS — E118 Type 2 diabetes mellitus with unspecified complications: Secondary | ICD-10-CM

## 2021-01-17 DIAGNOSIS — G8929 Other chronic pain: Secondary | ICD-10-CM

## 2021-01-17 DIAGNOSIS — I739 Peripheral vascular disease, unspecified: Secondary | ICD-10-CM | POA: Diagnosis not present

## 2021-01-17 DIAGNOSIS — E1151 Type 2 diabetes mellitus with diabetic peripheral angiopathy without gangrene: Secondary | ICD-10-CM | POA: Diagnosis not present

## 2021-01-17 DIAGNOSIS — F332 Major depressive disorder, recurrent severe without psychotic features: Secondary | ICD-10-CM

## 2021-01-17 DIAGNOSIS — T23221A Burn of second degree of single right finger (nail) except thumb, initial encounter: Secondary | ICD-10-CM

## 2021-01-17 DIAGNOSIS — I5032 Chronic diastolic (congestive) heart failure: Secondary | ICD-10-CM

## 2021-01-17 DIAGNOSIS — N3946 Mixed incontinence: Secondary | ICD-10-CM

## 2021-01-17 DIAGNOSIS — E1142 Type 2 diabetes mellitus with diabetic polyneuropathy: Secondary | ICD-10-CM

## 2021-01-17 DIAGNOSIS — F1120 Opioid dependence, uncomplicated: Secondary | ICD-10-CM

## 2021-01-17 DIAGNOSIS — Z720 Tobacco use: Secondary | ICD-10-CM

## 2021-01-17 LAB — POCT GLYCOSYLATED HEMOGLOBIN (HGB A1C): Hemoglobin A1C: 7.8 % — AB (ref 4.0–5.6)

## 2021-01-17 LAB — GLUCOSE, CAPILLARY: Glucose-Capillary: 200 mg/dL — ABNORMAL HIGH (ref 70–99)

## 2021-01-17 MED ORDER — DICLOFENAC SODIUM 1 % EX GEL: 2.0000 g | Freq: Four times a day (QID) | CUTANEOUS | 1 refills | Status: DC

## 2021-01-17 MED ORDER — INSULIN LISPRO (1 UNIT DIAL) 100 UNIT/ML (KWIKPEN): PEN_INJECTOR | SUBCUTANEOUS | 3 refills | Status: DC

## 2021-01-17 MED ORDER — PREGABALIN 200 MG PO CAPS
ORAL_CAPSULE | ORAL | 2 refills | Status: DC
Start: 2021-01-17 — End: 2021-04-29

## 2021-01-17 MED ORDER — BACLOFEN 10 MG PO TABS: ORAL_TABLET | ORAL | 2 refills | Status: DC

## 2021-01-17 NOTE — Patient Instructions (Signed)
Great to see you today, Jennifer [name]!  Come back in about a month so we can continue our conversation.  We talked many things today to catch up on how you've been doing since last visit.  Your L elbow pain appears to be due to an inflamed elbow tendon.  Because antiinflammatory pills could cause problems with kidney function, we will instead use a gel preparation.  Apply this to your painful areas up to 4 times a day. Use cold rather than heat.  You asked for a referral for an eye appointment and I will place the request.  You have an appt with a spine specialist in 01/2021, and a follow up with your urologist next month as well.  You plan to ask him/her about Myrbetric, since it isn't reducing your symptoms.  Keep a close eye on your finger - if it gets swollen, red, or drains, come to clinic ASAP because this could be signs of infection.  We looked at your L leg today.  An ultrasound will be arranged for you soon, to determine if your swelling and redness could be due to a bloodclot.  If that is the case, we'll talk by phone about next steps.  I'm going to do some checking on the status of your PT/OT and wheelchair process.
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
## 2021-01-22 ENCOUNTER — Other Ambulatory Visit: Payer: Self-pay | Admitting: *Deleted

## 2021-01-22 DIAGNOSIS — E1151 Type 2 diabetes mellitus with diabetic peripheral angiopathy without gangrene: Secondary | ICD-10-CM

## 2021-01-22 DIAGNOSIS — IMO0002 Reserved for concepts with insufficient information to code with codable children: Secondary | ICD-10-CM

## 2021-01-22 NOTE — Telephone Encounter (Signed)
Received call from patient requesting refills at [facility] on following meds:  Atenolol - no longer on current med list Wellbutin - sent to [facility] flonase - no longer on current med list HCTZ - no longer on current med list Metformin  Verio test strips  Patient also wanted to let PCP know that it was previous PCP who started patient on myrbetriq, not urology. This is also no longer on current med list but patient states she is taking it. [name], BSN, RN-BC

## 2021-01-23 NOTE — Telephone Encounter (Signed)
I'm confused myself so I'll have to do some digging.  The ones you approved are fine, thank you.

## 2021-01-25 MED ORDER — METFORMIN HCL ER 500 MG PO TB24
ORAL_TABLET | ORAL | 3 refills | Status: DC
Start: 2021-01-25 — End: 2021-02-15

## 2021-01-25 MED ORDER — ONETOUCH VERIO VI STRP: 1.0000 | ORAL_STRIP | 3 refills | Status: DC | PRN

## 2021-01-25 MED ORDER — ALBUTEROL SULFATE HFA 108 (90 BASE) MCG/ACT IN AERS: INHALATION_SPRAY | RESPIRATORY_TRACT | 3 refills | Status: DC

## 2021-01-25 MED ORDER — BUPROPION HCL ER (XL) 300 MG PO TB24
300.0000 mg | ORAL_TABLET | Freq: Every day | ORAL | 1 refills | Status: DC
Start: 2021-01-25 — End: 2021-09-19

## 2021-01-25 NOTE — Telephone Encounter (Signed)
Received call from [place] at [facility] requesting below refills. Will send to PCP for approval on albuterol, wellbutrin, test strips, and metformin while she is reviewing other requests. [name], BSN, RN-BC

## 2021-01-28 NOTE — Telephone Encounter (Signed)
Patient called in very upset that she hasn't received her atenolol when she takes this everyday for her high blood pressure. Please review med requests. Thank you. [name], BSN, RN-BC

## 2021-01-30 NOTE — Telephone Encounter (Signed)
Call to patient went to vm; I'll try to reach her later to discuss request for atenolol, which I can't identify anywhere on her medication history.

## 2021-02-04 ENCOUNTER — Other Ambulatory Visit: Payer: Self-pay | Admitting: Pain Medicine

## 2021-02-04 DIAGNOSIS — M5416 Radiculopathy, lumbar region: Secondary | ICD-10-CM

## 2021-02-04 DIAGNOSIS — M546 Pain in thoracic spine: Secondary | ICD-10-CM

## 2021-02-05 ENCOUNTER — Telehealth: Payer: Self-pay | Admitting: *Deleted

## 2021-02-05 ENCOUNTER — Encounter: Payer: Self-pay | Admitting: Internal Medicine

## 2021-02-05 DIAGNOSIS — T23221A Burn of second degree of single right finger (nail) except thumb, initial encounter: Secondary | ICD-10-CM | POA: Insufficient documentation

## 2021-02-05 DIAGNOSIS — M7712 Lateral epicondylitis, left elbow: Secondary | ICD-10-CM | POA: Insufficient documentation

## 2021-02-05 DIAGNOSIS — J42 Unspecified chronic bronchitis: Secondary | ICD-10-CM | POA: Insufficient documentation

## 2021-02-05 HISTORY — DX: Burn of second degree of single right finger (nail) except thumb, initial encounter: T23.221A
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
## 2021-02-05 NOTE — Assessment & Plan Note (Signed)
Not interested in quitting "don't talk to me about it.  It's the only thing that I enjoy.  Don't take that away too.  Everyone in my house smokes, I'm not going to quit."

## 2021-02-05 NOTE — Assessment & Plan Note (Signed)
  You have an appt with a spine specialist in 01/2021, and shared with me that you expect "something will be done".  This unfortunate situation is compounded by morbid obesity, inability to exercise (BKA), and relative contraindication to NSAIDs due to renal impairment.

## 2021-02-05 NOTE — Assessment & Plan Note (Addendum)
A1C 7.8 which is acceptable for her level of multimorbidity, socioeconomic challenges, and life expectancy.  Feet without lesions, toes warm, no pain of arterial insufficiency. Received diabetic shoe  from podiatry late 2021.  Due for eye exam.
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
## 2021-02-05 NOTE — Assessment & Plan Note (Addendum)
Appt with urologist 01/2021.  She is encouraged to speak to clinician about potentially stopping the mirabegron, as it has not alleviated her symptoms.  Significant component is functional give her limited mobility.

## 2021-02-05 NOTE — Telephone Encounter (Signed)
Called pt to keep her appt for tomorrow; no answer; left message on self-identified vm.

## 2021-02-06 ENCOUNTER — Encounter: Payer: Medicare Other | Admitting: Internal Medicine

## 2021-02-07 ENCOUNTER — Encounter: Payer: Medicare Other | Admitting: Internal Medicine

## 2021-02-14 ENCOUNTER — Ambulatory Visit (INDEPENDENT_AMBULATORY_CARE_PROVIDER_SITE_OTHER): Payer: Medicare Other | Admitting: Internal Medicine

## 2021-02-14 VITALS — BP 123/44 | HR 70 | Temp 98.0°F | Ht 67.0 in | Wt 268.8 lb

## 2021-02-14 DIAGNOSIS — R251 Tremor, unspecified: Secondary | ICD-10-CM

## 2021-02-14 DIAGNOSIS — T23221D Burn of second degree of single right finger (nail) except thumb, subsequent encounter: Secondary | ICD-10-CM | POA: Diagnosis not present

## 2021-02-14 DIAGNOSIS — I5032 Chronic diastolic (congestive) heart failure: Secondary | ICD-10-CM | POA: Diagnosis not present

## 2021-02-14 DIAGNOSIS — M62838 Other muscle spasm: Secondary | ICD-10-CM

## 2021-02-14 DIAGNOSIS — E118 Type 2 diabetes mellitus with unspecified complications: Secondary | ICD-10-CM | POA: Diagnosis not present

## 2021-02-14 DIAGNOSIS — I739 Peripheral vascular disease, unspecified: Secondary | ICD-10-CM

## 2021-02-14 DIAGNOSIS — G3184 Mild cognitive impairment, so stated: Secondary | ICD-10-CM | POA: Insufficient documentation

## 2021-02-14 DIAGNOSIS — R4189 Other symptoms and signs involving cognitive functions and awareness: Secondary | ICD-10-CM

## 2021-02-14 DIAGNOSIS — M5440 Lumbago with sciatica, unspecified side: Secondary | ICD-10-CM

## 2021-02-14 DIAGNOSIS — G8929 Other chronic pain: Secondary | ICD-10-CM

## 2021-02-14 DIAGNOSIS — R269 Unspecified abnormalities of gait and mobility: Secondary | ICD-10-CM

## 2021-02-14 DIAGNOSIS — Z0000 Encounter for general adult medical examination without abnormal findings: Secondary | ICD-10-CM

## 2021-02-14 DIAGNOSIS — N3946 Mixed incontinence: Secondary | ICD-10-CM

## 2021-02-14 DIAGNOSIS — T24212S Burn of second degree of left thigh, sequela: Secondary | ICD-10-CM

## 2021-02-14 DIAGNOSIS — Z79899 Other long term (current) drug therapy: Secondary | ICD-10-CM

## 2021-02-14 DIAGNOSIS — Z9889 Other specified postprocedural states: Secondary | ICD-10-CM

## 2021-02-14 DIAGNOSIS — H42 Glaucoma in diseases classified elsewhere: Secondary | ICD-10-CM

## 2021-02-14 DIAGNOSIS — E1139 Type 2 diabetes mellitus with other diabetic ophthalmic complication: Secondary | ICD-10-CM

## 2021-02-14 DIAGNOSIS — M7712 Lateral epicondylitis, left elbow: Secondary | ICD-10-CM

## 2021-02-14 DIAGNOSIS — B372 Candidiasis of skin and nail: Secondary | ICD-10-CM

## 2021-02-14 DIAGNOSIS — K219 Gastro-esophageal reflux disease without esophagitis: Secondary | ICD-10-CM

## 2021-02-14 MED ORDER — METOPROLOL SUCCINATE ER 50 MG PO TB24: 50.0000 mg | ORAL_TABLET | Freq: Every day | ORAL | 11 refills | Status: DC

## 2021-02-14 NOTE — Assessment & Plan Note (Signed)
Pain improved but not resolved.  Using topical NSAID bid-tid.  Not functionally limiting if she is careful.  Continue care and monitor.

## 2021-02-14 NOTE — Assessment & Plan Note (Signed)
LS MRI ordered for 02/28/21, f/u with NS on 03/12/21. Recent appt with Dr. Davy Pique on 01/15/21.

## 2021-02-14 NOTE — Assessment & Plan Note (Signed)
Continues to refrain from taking diuretic due to her severe urinary incontinence and mobility impairment.  She undergoes limited exertion and thus has no exertional dyspnea and no reported difficulties breathing.  Right lower extremity edema is tense and at least 2+.  See former documentation.  Notably, the edema resolved during hospitalization while she was recumbent.

## 2021-02-14 NOTE — Assessment & Plan Note (Signed)
No falls since her illness resulting in hospitalization.  Polypharmacy, obesity, deconditioning, severe diabetic peripheral polyneuropathy and LLE amputation are significant contributors.  A motorized WC was intended - I am not sure what its status is.

## 2021-02-14 NOTE — Assessment & Plan Note (Signed)
Urologist has recommended urodynamic studies (if insurance will cover) and has mentioned botox injections as a possible remedy.  The PTNS treatments have continued though they have not provided benefit.  Control of this severe problem would help with edema management (she can't take diuretic w/o constant wetness).

## 2021-02-14 NOTE — Assessment & Plan Note (Signed)
Healing is nearly complete; dorsal joint skin is hypertrophied, no open areas, no evidence of infection.  She has tendency to peel/pick skin/eschars which causes further injury.
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
## 2021-02-15 ENCOUNTER — Encounter: Payer: Self-pay | Admitting: Internal Medicine

## 2021-02-15 ENCOUNTER — Telehealth: Payer: Self-pay | Admitting: Dietician

## 2021-02-15 DIAGNOSIS — B372 Candidiasis of skin and nail: Secondary | ICD-10-CM | POA: Insufficient documentation

## 2021-02-15 DIAGNOSIS — R251 Tremor, unspecified: Secondary | ICD-10-CM | POA: Insufficient documentation

## 2021-02-15 MED ORDER — OMEPRAZOLE 20 MG PO CPDR: 40.0000 mg | DELAYED_RELEASE_CAPSULE | Freq: Every day | ORAL | 3 refills | Status: DC

## 2021-02-15 MED ORDER — VITAMIN D 50 MCG (2000 UT) PO CAPS: 2000.0000 [IU] | ORAL_CAPSULE | Freq: Every day | ORAL | Status: DC

## 2021-02-15 MED ORDER — BACLOFEN 10 MG PO TABS: ORAL_TABLET | ORAL | 2 refills | Status: DC

## 2021-02-15 MED ORDER — METFORMIN HCL 500 MG PO TABS: 500.0000 mg | ORAL_TABLET | Freq: Every day | ORAL | 3 refills | Status: DC

## 2021-02-15 NOTE — Telephone Encounter (Signed)
Jennifer [name] left a message that "her sugar is way over 600" , she is feeling bad, thirsty and going to the bathroom a lot." I need to know how much Humalog to take as she does not have a way into the office today". Per her scale that she had previously, for a blood sugar over 475 she was to take 35 units Humalog and call the office. Sending to attending and triage nurse.

## 2021-02-15 NOTE — Telephone Encounter (Signed)
Thanks to you both.  Jennifer [name] is feeling better.  SHe had checked her blood sugar after eating some chocolate from a Valentine's day meal-on-wheels.

## 2021-02-15 NOTE — Telephone Encounter (Signed)
Jennifer [name] a call, she reports that she did take 45 units of her sliding scale Humalog about 30 minutes ago, is starting to feel a little better.  She notes that dr Jimmye Norman told her she was going to call her this afternoon.  We discussed rechecking her sugars in a few hours about 3:30 or sooner if she has symptoms of hypoglycemia.  She will call back the on call number if she is having trouble getting her sugars down.  Reports only symptom right now that is concerning to her is polyuria.

## 2021-02-15 NOTE — Assessment & Plan Note (Signed)
Overdue for polyp fu colonoscopy - will refer at time of next visit once back pain has improved.

## 2021-02-15 NOTE — Assessment & Plan Note (Signed)
Rereferral placed for ophthalmology f/u

## 2021-02-25 ENCOUNTER — Other Ambulatory Visit: Payer: Self-pay

## 2021-02-25 DIAGNOSIS — E1121 Type 2 diabetes mellitus with diabetic nephropathy: Secondary | ICD-10-CM

## 2021-02-25 NOTE — Telephone Encounter (Signed)
Pt  Is requesting her metFORMIN (GLUCOPHAGE) 500 MG tablet and her   Insulin Pen Needle 32G X 4 MM MISC sent to  [facility], [place] Phone:  [phone]  Fax:  [phone]      ( pt stated that she is in need Prior auth  For her needles )

## 2021-02-26 MED ORDER — INSULIN PEN NEEDLE 32G X 4 MM MISC: 0 refills | Status: DC

## 2021-02-27 ENCOUNTER — Other Ambulatory Visit: Payer: Self-pay

## 2021-02-27 MED ORDER — METOPROLOL SUCCINATE ER 50 MG PO TB24: 50.0000 mg | ORAL_TABLET | Freq: Every day | ORAL | 11 refills | Status: DC

## 2021-02-28 ENCOUNTER — Ambulatory Visit
Admission: RE | Admit: 2021-02-28 | Discharge: 2021-02-28 | Disposition: A | Discharge status: Home / Self Care | Payer: Medicare Other | Source: Ambulatory Visit | Attending: Pain Medicine | Admitting: Pain Medicine

## 2021-02-28 ENCOUNTER — Other Ambulatory Visit: Payer: Self-pay

## 2021-02-28 DIAGNOSIS — M5416 Radiculopathy, lumbar region: Secondary | ICD-10-CM

## 2021-02-28 DIAGNOSIS — M546 Pain in thoracic spine: Secondary | ICD-10-CM

## 2021-03-01 DIAGNOSIS — E042 Nontoxic multinodular goiter: Secondary | ICD-10-CM | POA: Insufficient documentation

## 2021-03-02 ENCOUNTER — Emergency Department (HOSPITAL_COMMUNITY): Payer: Medicare Other

## 2021-03-02 ENCOUNTER — Encounter (HOSPITAL_COMMUNITY): Payer: Self-pay | Admitting: Emergency Medicine

## 2021-03-02 ENCOUNTER — Observation Stay (HOSPITAL_COMMUNITY): Payer: Medicare Other

## 2021-03-02 ENCOUNTER — Other Ambulatory Visit: Payer: Self-pay

## 2021-03-02 ENCOUNTER — Observation Stay (HOSPITAL_COMMUNITY)
Admission: EM | Admit: 2021-03-02 | Discharge: 2021-03-06 | Disposition: A | Discharge status: Home / Self Care | Payer: Medicare Other | Attending: Internal Medicine | Admitting: Internal Medicine

## 2021-03-02 ENCOUNTER — Telehealth: Payer: Self-pay | Admitting: Internal Medicine

## 2021-03-02 DIAGNOSIS — Z720 Tobacco use: Secondary | ICD-10-CM | POA: Diagnosis present

## 2021-03-02 DIAGNOSIS — M4802 Spinal stenosis, cervical region: Secondary | ICD-10-CM | POA: Insufficient documentation

## 2021-03-02 DIAGNOSIS — E1122 Type 2 diabetes mellitus with diabetic chronic kidney disease: Secondary | ICD-10-CM | POA: Diagnosis not present

## 2021-03-02 DIAGNOSIS — N1832 Chronic kidney disease, stage 3b: Secondary | ICD-10-CM | POA: Insufficient documentation

## 2021-03-02 DIAGNOSIS — E119 Type 2 diabetes mellitus without complications: Secondary | ICD-10-CM | POA: Diagnosis present

## 2021-03-02 DIAGNOSIS — Z20822 Contact with and (suspected) exposure to covid-19: Secondary | ICD-10-CM | POA: Diagnosis not present

## 2021-03-02 DIAGNOSIS — J449 Chronic obstructive pulmonary disease, unspecified: Secondary | ICD-10-CM | POA: Diagnosis not present

## 2021-03-02 DIAGNOSIS — Z794 Long term (current) use of insulin: Secondary | ICD-10-CM | POA: Diagnosis not present

## 2021-03-02 DIAGNOSIS — Z6835 Body mass index (BMI) 35.0-35.9, adult: Secondary | ICD-10-CM | POA: Diagnosis present

## 2021-03-02 DIAGNOSIS — E669 Obesity, unspecified: Secondary | ICD-10-CM | POA: Diagnosis present

## 2021-03-02 DIAGNOSIS — R269 Unspecified abnormalities of gait and mobility: Secondary | ICD-10-CM

## 2021-03-02 DIAGNOSIS — I5032 Chronic diastolic (congestive) heart failure: Secondary | ICD-10-CM | POA: Diagnosis not present

## 2021-03-02 DIAGNOSIS — I6521 Occlusion and stenosis of right carotid artery: Secondary | ICD-10-CM | POA: Diagnosis not present

## 2021-03-02 DIAGNOSIS — Z79899 Other long term (current) drug therapy: Secondary | ICD-10-CM | POA: Diagnosis not present

## 2021-03-02 DIAGNOSIS — R42 Dizziness and giddiness: Principal | ICD-10-CM

## 2021-03-02 DIAGNOSIS — H8190 Unspecified disorder of vestibular function, unspecified ear: Secondary | ICD-10-CM

## 2021-03-02 DIAGNOSIS — E118 Type 2 diabetes mellitus with unspecified complications: Secondary | ICD-10-CM | POA: Diagnosis present

## 2021-03-02 DIAGNOSIS — I130 Hypertensive heart and chronic kidney disease with heart failure and stage 1 through stage 4 chronic kidney disease, or unspecified chronic kidney disease: Secondary | ICD-10-CM | POA: Insufficient documentation

## 2021-03-02 DIAGNOSIS — Z7982 Long term (current) use of aspirin: Secondary | ICD-10-CM | POA: Insufficient documentation

## 2021-03-02 DIAGNOSIS — E11621 Type 2 diabetes mellitus with foot ulcer: Secondary | ICD-10-CM | POA: Diagnosis present

## 2021-03-02 DIAGNOSIS — F17210 Nicotine dependence, cigarettes, uncomplicated: Secondary | ICD-10-CM | POA: Insufficient documentation

## 2021-03-02 DIAGNOSIS — E1165 Type 2 diabetes mellitus with hyperglycemia: Secondary | ICD-10-CM | POA: Diagnosis present

## 2021-03-02 HISTORY — DX: Unspecified disorder of vestibular function, unspecified ear: H81.90

## 2021-03-02 LAB — RESP PANEL BY RT-PCR (FLU A&B, COVID) ARPGX2
Influenza A by PCR: NEGATIVE
Influenza B by PCR: NEGATIVE
SARS Coronavirus 2 by RT PCR: NEGATIVE

## 2021-03-02 LAB — CBC
HCT: 40.5 % (ref 36.0–46.0)
Hemoglobin: 13.4 g/dL (ref 12.0–15.0)
MCH: 32 pg (ref 26.0–34.0)
MCHC: 33.1 g/dL (ref 30.0–36.0)
MCV: 96.7 fL (ref 80.0–100.0)
Platelets: 128 10*3/uL — ABNORMAL LOW (ref 150–400)
RBC: 4.19 MIL/uL (ref 3.87–5.11)
RDW: 14.6 % (ref 11.5–15.5)
WBC: 7.2 10*3/uL (ref 4.0–10.5)
nRBC: 0 % (ref 0.0–0.2)

## 2021-03-02 LAB — DIFFERENTIAL
Abs Immature Granulocytes: 0.01 10*3/uL (ref 0.00–0.07)
Basophils Absolute: 0.1 10*3/uL (ref 0.0–0.1)
Basophils Relative: 1 %
Eosinophils Absolute: 0.2 10*3/uL (ref 0.0–0.5)
Eosinophils Relative: 2 %
Immature Granulocytes: 0 %
Lymphocytes Relative: 45 %
Lymphs Abs: 3.3 10*3/uL (ref 0.7–4.0)
Monocytes Absolute: 0.4 10*3/uL (ref 0.1–1.0)
Monocytes Relative: 6 %
Neutro Abs: 3.3 10*3/uL (ref 1.7–7.7)
Neutrophils Relative %: 46 %

## 2021-03-02 LAB — COMPREHENSIVE METABOLIC PANEL
ALT: 23 U/L (ref 0–44)
AST: 25 U/L (ref 15–41)
Albumin: 3.5 g/dL (ref 3.5–5.0)
Alkaline Phosphatase: 67 U/L (ref 38–126)
Anion gap: 9 (ref 5–15)
BUN: 18 mg/dL (ref 8–23)
CO2: 26 mmol/L (ref 22–32)
Calcium: 9.3 mg/dL (ref 8.9–10.3)
Chloride: 104 mmol/L (ref 98–111)
Creatinine, Ser: 1.14 mg/dL — ABNORMAL HIGH (ref 0.44–1.00)
GFR, Estimated: 54 mL/min — ABNORMAL LOW (ref >60)
Glucose, Bld: 165 mg/dL — ABNORMAL HIGH (ref 70–99)
Potassium: 4.4 mmol/L (ref 3.5–5.1)
Sodium: 139 mmol/L (ref 135–145)
Total Bilirubin: 0.6 mg/dL (ref 0.3–1.2)
Total Protein: 6.9 g/dL (ref 6.5–8.1)

## 2021-03-02 LAB — CBG MONITORING, ED: Glucose-Capillary: 165 mg/dL — ABNORMAL HIGH (ref 70–99)

## 2021-03-02 LAB — APTT: aPTT: 30 seconds (ref 24–36)

## 2021-03-02 LAB — PROTIME-INR
INR: 1.1 (ref 0.8–1.2)
Prothrombin Time: 13.5 seconds (ref 11.4–15.2)

## 2021-03-02 MED ORDER — ACETAMINOPHEN 650 MG RE SUPP: 650.0000 mg | Freq: Four times a day (QID) | RECTAL | Status: DC | PRN

## 2021-03-02 MED ORDER — SODIUM CHLORIDE 0.9% FLUSH: 3.0000 mL | Freq: Once | INTRAVENOUS | Status: DC

## 2021-03-02 MED ORDER — ACETAMINOPHEN 325 MG PO TABS
650.0000 mg | ORAL_TABLET | Freq: Four times a day (QID) | ORAL | Status: DC | PRN
  Administered 2021-03-03 – 2021-03-05 (×4): 650 mg via ORAL
  Filled 2021-03-02 (×4): qty 2

## 2021-03-02 MED ORDER — SODIUM CHLORIDE 0.9 % IV BOLUS
500.0000 mL | Freq: Once | INTRAVENOUS | Status: AC
  Administered 2021-03-02: 500 mL via INTRAVENOUS

## 2021-03-02 MED ORDER — INSULIN GLARGINE 100 UNIT/ML ~~LOC~~ SOLN
35.0000 [IU] | Freq: Every day | SUBCUTANEOUS | Status: DC
  Administered 2021-03-03 – 2021-03-04 (×2): 35 [IU] via SUBCUTANEOUS
  Filled 2021-03-02 (×4): qty 0.35

## 2021-03-02 MED ORDER — INSULIN ASPART 100 UNIT/ML ~~LOC~~ SOLN
0.0000 [IU] | Freq: Every day | SUBCUTANEOUS | Status: DC
  Administered 2021-03-03 – 2021-03-04 (×2): 2 [IU] via SUBCUTANEOUS

## 2021-03-02 MED ORDER — ONDANSETRON HCL 4 MG PO TABS: 4.0000 mg | ORAL_TABLET | Freq: Four times a day (QID) | ORAL | Status: DC | PRN

## 2021-03-02 MED ORDER — INSULIN ASPART 100 UNIT/ML ~~LOC~~ SOLN
0.0000 [IU] | Freq: Three times a day (TID) | SUBCUTANEOUS | Status: DC
  Administered 2021-03-03: 8 [IU] via SUBCUTANEOUS
  Administered 2021-03-04: 3 [IU] via SUBCUTANEOUS
  Administered 2021-03-04 – 2021-03-05 (×3): 5 [IU] via SUBCUTANEOUS

## 2021-03-02 MED ORDER — MECLIZINE HCL 25 MG PO TABS: 12.5000 mg | ORAL_TABLET | Freq: Once | ORAL | Status: DC

## 2021-03-02 MED ORDER — ENOXAPARIN SODIUM 40 MG/0.4ML ~~LOC~~ SOLN
40.0000 mg | SUBCUTANEOUS | Status: DC
  Administered 2021-03-03 – 2021-03-05 (×4): 40 mg via SUBCUTANEOUS
  Filled 2021-03-02 (×4): qty 0.4

## 2021-03-02 MED ORDER — ONDANSETRON HCL 4 MG/2ML IJ SOLN
4.0000 mg | Freq: Four times a day (QID) | INTRAMUSCULAR | Status: DC | PRN
  Administered 2021-03-03: 4 mg via INTRAVENOUS
  Filled 2021-03-02: qty 2

## 2021-03-02 NOTE — Telephone Encounter (Signed)
Patient calls the IMTS on call pager stating that she had acute onset of dizziness that started last Wednesday. She states she has had dizziness every day that is worse with movement and has caused her to become nauseated with episodes of emesis. She states that the dizziness will last for several hours and only gets mild relief when she lays completely still. She has a significant history of HLD, HTN, DVT, PAD with stents, and tobacco use disorder, I am concerned she may have a posterior infarct. Therefore, I counseled her to call EMS so she can be evaluated at the hospital. Patient admits to understanding and agrees with the plan.  Lawerance Cruel, D.O.  Internal Medicine Resident, PGY-2 [facility]  Pager: [phone] 3:57 PM, 03/02/2021

## 2021-03-02 NOTE — ED Triage Notes (Signed)
Pt reports dizziness, vomiting, and L sided weakness/numbness x 1 week.

## 2021-03-02 NOTE — ED Notes (Signed)
Pt to mri 

## 2021-03-02 NOTE — Progress Notes (Incomplete)
Date: 03/02/2021               Patient Name:  Jennifer [name] MRN: [id number]  DOB: 08-31-1961 Age / Sex: 61 y.o., female   PCP: Angelica Pou, MD         Medical Service: Internal Medicine Teaching Service         Attending Physician: Dr. Valarie Merino, MD    First Contact: Dr. Konrad Penta Pager: [phone]  Second Contact: Dr. Marianna Payment Pager: [phone]       After Hours (After 5p/  First Contact Pager: [phone]  weekends / holidays): Second Contact Pager: [phone]   Chief Complaint: dizziness  History of Present Illness:  Jennifer [name] is 60yo person with type II diabetes mellitus, peripheral vascular disease s/p L BKA 2014, hx DVT 3335, chronic diastolic heart failure, COPD, hypertension presenting ED for 1 wk history of dizziness and nausea. Patient states she was in her usual state of health until last Wednesday when she began feeling dizzy. Describes it as "feeling as the room is spinning." States this lasts for hours-days and is not related to position, no obvious triggering event. Denies syncope or falls. This is associated with nausea, non-bloody vomiting, and headaches. Describes the headaches as left-sided, including some of her face. She says she has not been able to eat well since onset of symptoms, although was able to eat some breakfast this morning. Mentions some epigastric intermittent abdominal cramping prior to vomiting. Denies fevers, chills, palpitations, dyspnea, dysuria. Patient does have chronic hearing loss requiring hearing aids as well as tinnitus. Of note, she mentions her son had acute gastrointestinal illness last weekend which included symptoms of diarrhea and vomiting. She is vaccinated x3 for COVID-19 and believes she received the influenza vaccine this year.   Of note, patient reports compliance to medications. States one of her blood pressure medications was recently changed by her PCP at last visit (2/24). She mentions her blood sugars are often  extremely high, including a recent episode with sugars >600. Says her sugars were in 200's this morning.   Past Medical History: Past Medical History:  Diagnosis Date  . Cataract   . Chronic bronchitis ([place])    "I get it alot" (09/28/2013)  . Chronic diastolic heart failure ([facility])    grade 2 per 2D echocardiogram (01/2013)  . Chronic lower back pain   . Chronic pain syndrome 12/03/2011   Likely secondary to depression, "fibromyalgia", neuropathy, and obesity. Lumbar MRI 2014 no sig change from prior (2008) : Stable hypertrophic facet disease most notable at L4-5. Stable shallow left foraminal/extraforaminal disc protrusion at L4-5. No direct neural compression.      Marland Kitchen COPD 01/08/2007   PFT's 05/2007 : FEV1/FVC 82, FEV1 64% pred, FEF 25-75% 40% predicted, 16% improvement in FEV1 with bronchodilators.     . Diabetic peripheral neuropathy ([place])   . DVT of upper extremity (deep vein thrombosis) ([place]) 03/11/2013   Secondary to PICC line. Right brachial vein, diagnosed on 03/10/2013 Coumadin for 3 months. End date 06/10/2013   . Environmental allergies    Hx: of  . Fatty liver 2003   observed on ultrasound abdomen  . Fibromyalgia   . GERD (gastroesophageal reflux disease)   . Glaucoma   . History of bacterial endocarditis 2014   Endocarditis involving mitral and tricuspid valves.  S. Aureus and GBS.   Marland Kitchen History of use of hearing aid   . Hyperlipidemia   . Hyperplastic colon polyp  12/2010   Per colonoscopy (12/2010) - Dr. Deatra Ina  . Hypertension   . 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 PVD (peripheral vascular disease) with claudication ([place])    Stents to bilateral common iliac arteries (left 2005, right 2008), on chronic plavix  . Pyelonephritis 10/28/2020  . S/P BKA (below knee amputation) unilateral ([place])    2014 L - failed limb preserving treatment. 2/2 tobacco use, DM, and cont weight bearing on surgical wound and developed  gangrene   . Tobacco abuse   . Type II diabetes mellitus with peripheral circulatory disorders, uncontrolled DX: 1993   Insulin dep. Poor control. Complicated by diabetic foot ulcer and diabetic eye disease.     Medications: Albuterol 2 puffs q6h PRN Amlodipine 10mg  qd ASA 81mg  qd Baclofen PRN Benazepril 40mg  qd Buproprion 300mg  qd Tresiba 200U qd SSI w/ meals Metformin 500mg  qd Toprol 50mg  qd Mirabegron 50mg  qd Omeprazole 20mg  qd Oxybutynin 5mg  qd Percocet 5-325mg  q8h PRN  Ozempic 0.5mg  q week Pregabalin 200mg  qd Rosuvastatin 20mg  qd Anoro ellipta 1 puff qd  Allergies: Allergies as of 03/02/2021 - Review Complete 02/15/2021  Allergen Reaction Noted  . Abilify [aripiprazole] Other (See Comments) 11/09/2015  . Iohexol Other (See Comments) 08/17/2007  . Ivp dye [iodinated diagnostic agents] Other (See Comments) 05/10/2013  . Morphine sulfate Itching and Rash    Family History:  Family History  Problem Relation Age of Onset  . Diverticulosis Mother   . Diabetes Mother   . Hypertension Mother   . Congestive Heart Failure Mother   . Asthma Father   . CAD Sister 88       MI at age 36 per patient.  However, she has not had a stent or CABG.   . Heart disease Sister        before age 73  . Breast cancer Neg Hx    Social History:  Patient lives with multiple family members here in [place]. Uses a walker for ambulation, can complete ADL's by herself but needs assistance with IADL's. Current smoker 0.5ppd, total 45 pack-year history. Occasional alcohol use. Previously smoked crack/cocaine, last use 1989. Previously smoked marijuana, last use 2012.   Review of Systems: A complete ROS was negative except as per HPI.   Physical Exam: Blood pressure (!) 151/57, pulse 66, temperature 98 F (36.7 C), temperature source Oral, resp. rate 14, height 5\' 7"  (1.702 m), weight 117.9 kg, SpO2 98 %. General: Laying in bed, non ill-appearing, non-diaphoretic, no acute distress HENT:  Normocephalic, atraumatic. Moist mucous membranes. CV: Regular rate, rhythm. No murmurs, rubs, gallops appreciated. Pulm: Normal work of breathing on room air. Clear to auscultation bilaterally. No wheezing, rales, rhonchi. GI: Soft, non-distended, non-tender. Normoactive bowel sounds. MSK: 1+ pitting edema bilaterally to mid-tibia. Normal bulk, tone. L AKA. Skin: Warm, dry. No rashes or lesions visible. Neuro: Awake, alert, oriented x4. CN in tact. Horizontal nystagmus present on L gaze. Sensation decreased on left upper extremity. Motor 5/5 RUE, 4/5 LUE, 5/5 RLE. Finger to nose normal bilaterally. Psych: Normal speech, mood. Poor insight to medical conditions.  EKG: personally reviewed my interpretation is normal sinus rhythm, left axis deviation (unchanged from 10/2020)  CT Head: No acute intracranial findings.  Assessment & Plan by Problem: Ms. Micheale Schlack is 60yo person (she/her) with type II diabetes mellitus, peripheral vascular disease s/p L BKA 1610, chronic diastolic heart failure, COPD, hypertension admitted 3/12 for dizziness and vomiting likely due  to viral gastroenteritis, found to have concerning neurological symptoms although unclear chronicity.  Active Problems:   Dizziness  #Dizziness #Nausea/Vomiting On arrival, patient afebrile, hemodynamically stable sating well on room air. In ED given 0.5L bolus IVF. Patient denies chest pain, palpitations, syncopal events. This episodic dizziness lasts hours-days and is not positional. L horizontal nystagmus most consistent with peripheral lesion, possibly neuritis. CT head negative, MRI pending to assess for infarction. Patient also has had family members with gastrointestinal symptoms, presentation could be 2/2 viral gastroenteritis. Can also consider Meniere's disease, although hearing loss and tinnitus appear to be more chronic issue. Will monitor overnight for supportive care and have her follow-up with PCP within the next week. -      Dispo: Admit patient to Observation with expected length of stay less than 2 midnights.  Signed: Sanjuan Dame, MD 03/02/2021, 6:49 PM  Pager: [phone] After 5pm on weekdays and 1pm on weekends: On Call pager: [phone]

## 2021-03-02 NOTE — ED Notes (Signed)
Pure wick placed.

## 2021-03-02 NOTE — Progress Notes (Deleted)
Date: 03/02/2021               Patient Name:  Jennifer [name] MRN: [id number]  DOB: 1961/02/23 Age / Sex: 60 y.o., female   PCP: Angelica Pou, MD         Medical Service: Internal Medicine Teaching Service         Attending Physician: Dr. Valarie Merino, MD    First Contact: Dr. Konrad Penta Pager: [phone]  Second Contact: Dr. Marianna Payment Pager: [phone]       After Hours (After 5p/  First Contact Pager: [phone]  weekends / holidays): Second Contact Pager: [phone]   Chief Complaint: dizziness  History of Present Illness:  Jennifer [name] is 60yo person with type II diabetes mellitus, peripheral vascular disease s/p L BKA 2014, hx DVT 0109, chronic diastolic heart failure, COPD, hypertension presenting ED for 1 wk history of dizziness and nausea. Patient states she was in her usual state of health until last Wednesday when she began feeling dizzy. Describes it as "feeling as the room is spinning." States this lasts for hours-days and is not related to position, no obvious triggering event. Denies syncope or falls. This is associated with nausea, non-bloody vomiting, and headaches. Describes the headaches as left-sided, including some of her face. She says she has not been able to eat well since onset of symptoms, although was able to eat some breakfast this morning. Mentions some epigastric intermittent abdominal cramping prior to vomiting. Denies fevers, chills, palpitations, dyspnea, dysuria. Patient does have chronic hearing loss requiring hearing aids as well as tinnitus. Of note, she mentions her son had acute gastrointestinal illness last weekend which included symptoms of diarrhea and vomiting. She is vaccinated x3 for COVID-19 and believes she received the influenza vaccine this year.   Of note, patient reports compliance to medications. States one of her blood pressure medications was recently changed by her PCP at last visit (2/24). She mentions her blood sugars are often  extremely high, including a recent episode with sugars >600. Says her sugars were in 200's this morning.   Past Medical History: Past Medical History:  Diagnosis Date  . Cataract   . Chronic bronchitis ([place])    "I get it alot" (09/28/2013)  . Chronic diastolic heart failure ([facility])    grade 2 per 2D echocardiogram (01/2013)  . Chronic lower back pain   . Chronic pain syndrome 12/03/2011   Likely secondary to depression, "fibromyalgia", neuropathy, and obesity. Lumbar MRI 2014 no sig change from prior (2008) : Stable hypertrophic facet disease most notable at L4-5. Stable shallow left foraminal/extraforaminal disc protrusion at L4-5. No direct neural compression.      Marland Kitchen COPD 01/08/2007   PFT's 05/2007 : FEV1/FVC 82, FEV1 64% pred, FEF 25-75% 40% predicted, 16% improvement in FEV1 with bronchodilators.     . Diabetic peripheral neuropathy ([place])   . DVT of upper extremity (deep vein thrombosis) ([place]) 03/11/2013   Secondary to PICC line. Right brachial vein, diagnosed on 03/10/2013 Coumadin for 3 months. End date 06/10/2013   . Environmental allergies    Hx: of  . Fatty liver 2003   observed on ultrasound abdomen  . Fibromyalgia   . GERD (gastroesophageal reflux disease)   . Glaucoma   . History of bacterial endocarditis 2014   Endocarditis involving mitral and tricuspid valves.  S. Aureus and GBS.   Marland Kitchen History of use of hearing aid   . Hyperlipidemia   . Hyperplastic colon polyp  12/2010   Per colonoscopy (12/2010) - Dr. Deatra Ina  . Hypertension   . 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)    Diagnosed age 57; treated initially with "lots of aspirin"  . PVD (peripheral vascular disease) with claudication ([place])    Stents to bilateral common iliac arteries (left 2005, right 2008), on chronic plavix  . Pyelonephritis 10/28/2020  . S/P BKA (below knee amputation) unilateral ([place])    2014 L - failed limb preserving treatment. 2/2 tobacco use, DM, and cont weight bearing on surgical wound and developed  gangrene   . Tobacco abuse   . Type II diabetes mellitus with peripheral circulatory disorders, uncontrolled DX: 1993   Insulin dep. Poor control. Complicated by diabetic foot ulcer and diabetic eye disease.     Medications: Albuterol 2 puffs q6h PRN Amlodipine 10mg  qd ASA 81mg  qd Baclofen PRN Benazepril 40mg  qd Buproprion 300mg  qd Tresiba 200U qd SSI w/ meals Metformin 500mg  qd Toprol 50mg  qd Mirabegron 50mg  qd Omeprazole 20mg  qd Oxybutynin 5mg  qd Percocet 5-325mg  q8h PRN  Ozempic 0.5mg  q week Pregabalin 200mg  qd Rosuvastatin 20mg  qd Anoro ellipta 1 puff qd  Allergies: Allergies as of 03/02/2021 - Review Complete 02/15/2021  Allergen Reaction Noted  . Abilify [aripiprazole] Other (See Comments) 11/09/2015  . Iohexol Other (See Comments) 08/17/2007  . Ivp dye [iodinated diagnostic agents] Other (See Comments) 05/10/2013  . Morphine sulfate Itching and Rash    Family History:  Family History  Problem Relation Age of Onset  . Diverticulosis Mother   . Diabetes Mother   . Hypertension Mother   . Congestive Heart Failure Mother   . Asthma Father   . CAD Sister 34       MI at age 61 per patient.  However, she has not had a stent or CABG.   . Heart disease Sister        before age 44  . Breast cancer Neg Hx    Social History:  Patient lives with multiple family members here in [place]. Uses a walker for ambulation, can complete ADL's by herself but needs assistance with IADL's. Current smoker 0.5ppd, total 45 pack-year history. Occasional alcohol use. Previously smoked crack/cocaine, last use 1989. Previously smoked marijuana, last use 2012.   Review of Systems: A complete ROS was negative except as per HPI.   Physical Exam: Blood pressure (!) 151/57, pulse 66, temperature 98 F (36.7 C), temperature source Oral, resp. rate 14, height 5\' 7"  (1.702 m), weight 117.9 kg, SpO2 98 %. General: Laying in bed, non ill-appearing, non-diaphoretic, no acute distress HENT:  Normocephalic, atraumatic. Moist mucous membranes. CV: Regular rate, rhythm. No murmurs, rubs, gallops appreciated. Pulm: Normal work of breathing on room air. Clear to auscultation bilaterally. No wheezing, rales, rhonchi. GI: Soft, non-distended, non-tender. Normoactive bowel sounds. MSK: 1+ pitting edema bilaterally to mid-tibia. Normal bulk, tone. L AKA. Skin: Warm, dry. No rashes or lesions visible. Neuro: Awake, alert, oriented x4. CN in tact. Horizontal nystagmus present on L gaze. Sensation decreased on left upper extremity. Motor 5/5 RUE, 4/5 LUE, 5/5 RLE. Finger to nose normal bilaterally. Psych: Stuttering present, normal mood. Poor insight to medical conditions.  CBC Latest Ref Rng & Units 03/02/2021 11/08/2020 11/07/2020  WBC 4.0 - 10.5 K/uL 7.2 9.1 10.4  Hemoglobin 12.0 - 15.0 g/dL 13.4 10.8(L) 11.8(L)  Hematocrit 36.0 - 46.0 % 40.5 33.9(L) 37.1  Platelets 150 - 400 K/uL 128(L) 243 298   CMP Latest Ref Rng & Units 03/02/2021 11/08/2020 11/07/2020  Glucose 70 -  99 mg/dL 165(H) 226(H) 82  BUN 8 - 23 mg/dL 18 16 15   Creatinine 0.44 - 1.00 mg/dL 1.14(H) 1.08(H) 1.11(H)  Sodium 135 - 145 mmol/L 139 141 142  Potassium 3.5 - 5.1 mmol/L 4.4 4.3 4.1  Chloride 98 - 111 mmol/L 104 107 107  CO2 22 - 32 mmol/L 26 27 27   Calcium 8.9 - 10.3 mg/dL 9.3 8.7(L) 9.2  Total Protein 6.5 - 8.1 g/dL 6.9 - 6.7  Total Bilirubin 0.3 - 1.2 mg/dL 0.6 - 0.2(L)  Alkaline Phos 38 - 126 U/L 67 - 80  AST 15 - 41 U/L 25 - 20  ALT 0 - 44 U/L 23 - 28   EKG: personally reviewed my interpretation is normal sinus rhythm, left axis deviation (unchanged from 10/2020)  CT Head: No acute intracranial findings.  Assessment & Plan by Problem: Ms. Nayelis Bonito is 60yo person (she/her) with type II diabetes mellitus, peripheral vascular disease s/p L BKA 3729, chronic diastolic heart failure, COPD, hypertension admitted 3/12 for dizziness and vomiting likely due to viral gastroenteritis, found to have concerning  neurological symptoms although unclear chronicity.  Active Problems:   Dizziness  #Dizziness #Nausea/Vomiting On arrival, patient afebrile, hemodynamically stable sating well on room air. In ED given 0.5L bolus IVF. Patient denies chest pain, palpitations, syncopal events. This episodic dizziness lasts hours-days and is not positional. L horizontal nystagmus most consistent with peripheral lesion, possibly labrynthitis/neuritis. CT head negative, MRI pending to assess for infarction. Patient also has had family members with gastrointestinal symptoms, presentation could be 2/2 viral gastroenteritis. No overt abnormalities on initial lab work. Can also consider Meniere's disease, although hearing loss and tinnitus appear to be more chronic issue. Will monitor overnight for supportive care and have her follow-up with PCP within the next week. - F/u MRI brain - Zofran 4mg  q6h PRN - SLP/PT/OT  #LUE mild weakness, numbness on exam Found to have decreased sensation and mild weakness of left upper extremity. Denies injury or falls. When questioned, mentions that this is a new finding. CT head negative, obtaining MRI this evening for further assessment. Suggest re-examination in the morning for further evaluation.   Chronic: #Type II diabetes mellitus Last A1c 7.8 in January 2022. On Tresiba, metformin, and SSI. Glucose 165 on arrival.    #Chronic diastolic heart failure Lungs CTA, 1+ pitting edema. Unclear baseline. Last Echo 2020. Continue home medications.  #COPD No change in respiratory status, no increased oxygen requirement. Continue home meds.   #Hypertension Will continue home medications.  Dispo: Admit patient to Observation with expected length of stay less than 2 midnights.  Signed: Sanjuan Dame, MD 03/02/2021, 6:49 PM  Pager: [phone] After 5pm on weekdays and 1pm on weekends: On Call pager: [phone]
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
## 2021-03-02 NOTE — ED Notes (Addendum)
Pt transported to CT, will be brought to the room when finished.

## 2021-03-03 ENCOUNTER — Observation Stay (HOSPITAL_COMMUNITY): Payer: Medicare Other

## 2021-03-03 DIAGNOSIS — Z7984 Long term (current) use of oral hypoglycemic drugs: Secondary | ICD-10-CM

## 2021-03-03 DIAGNOSIS — E669 Obesity, unspecified: Secondary | ICD-10-CM

## 2021-03-03 DIAGNOSIS — I5032 Chronic diastolic (congestive) heart failure: Secondary | ICD-10-CM

## 2021-03-03 DIAGNOSIS — A09 Infectious gastroenteritis and colitis, unspecified: Secondary | ICD-10-CM

## 2021-03-03 DIAGNOSIS — G459 Transient cerebral ischemic attack, unspecified: Secondary | ICD-10-CM | POA: Diagnosis not present

## 2021-03-03 DIAGNOSIS — I10 Essential (primary) hypertension: Secondary | ICD-10-CM

## 2021-03-03 DIAGNOSIS — E119 Type 2 diabetes mellitus without complications: Secondary | ICD-10-CM | POA: Diagnosis not present

## 2021-03-03 DIAGNOSIS — H8190 Unspecified disorder of vestibular function, unspecified ear: Secondary | ICD-10-CM | POA: Diagnosis not present

## 2021-03-03 LAB — CBC
HCT: 39.1 % (ref 36.0–46.0)
Hemoglobin: 12.9 g/dL (ref 12.0–15.0)
MCH: 31.4 pg (ref 26.0–34.0)
MCHC: 33 g/dL (ref 30.0–36.0)
MCV: 95.1 fL (ref 80.0–100.0)
Platelets: 123 10*3/uL — ABNORMAL LOW (ref 150–400)
RBC: 4.11 MIL/uL (ref 3.87–5.11)
RDW: 14.4 % (ref 11.5–15.5)
WBC: 6.5 10*3/uL (ref 4.0–10.5)
nRBC: 0 % (ref 0.0–0.2)

## 2021-03-03 LAB — BASIC METABOLIC PANEL
Anion gap: 6 (ref 5–15)
BUN: 14 mg/dL (ref 8–23)
CO2: 27 mmol/L (ref 22–32)
Calcium: 9.3 mg/dL (ref 8.9–10.3)
Chloride: 108 mmol/L (ref 98–111)
Creatinine, Ser: 0.93 mg/dL (ref 0.44–1.00)
GFR, Estimated: >60 mL/min (ref >60)
Glucose, Bld: 91 mg/dL (ref 70–99)
Potassium: 4.2 mmol/L (ref 3.5–5.1)
Sodium: 141 mmol/L (ref 135–145)

## 2021-03-03 LAB — GLUCOSE, CAPILLARY
Glucose-Capillary: 93 mg/dL (ref 70–99)
Glucose-Capillary: 119 mg/dL — ABNORMAL HIGH (ref 70–99)
Glucose-Capillary: 252 mg/dL — ABNORMAL HIGH (ref 70–99)
Glucose-Capillary: 248 mg/dL — ABNORMAL HIGH (ref 70–99)

## 2021-03-03 LAB — RETICULOCYTES
Immature Retic Fract: 6.9 % (ref 2.3–15.9)
RBC.: 4.18 MIL/uL (ref 3.87–5.11)
Retic Count, Absolute: 40.5 10*3/uL (ref 19.0–186.0)
Retic Ct Pct: 1 % (ref 0.4–3.1)

## 2021-03-03 LAB — SAVE SMEAR(SSMR), FOR PROVIDER SLIDE REVIEW

## 2021-03-03 MED ORDER — ROSUVASTATIN CALCIUM 20 MG PO TABS
20.0000 mg | ORAL_TABLET | Freq: Every day | ORAL | Status: DC
  Administered 2021-03-03 – 2021-03-06 (×4): 20 mg via ORAL
  Filled 2021-03-03 (×4): qty 1

## 2021-03-03 MED ORDER — PANTOPRAZOLE SODIUM 40 MG PO TBEC
40.0000 mg | DELAYED_RELEASE_TABLET | Freq: Every day | ORAL | Status: DC
  Administered 2021-03-03 – 2021-03-06 (×4): 40 mg via ORAL
  Filled 2021-03-03 (×4): qty 1

## 2021-03-03 MED ORDER — UMECLIDINIUM-VILANTEROL 62.5-25 MCG/INH IN AEPB
1.0000 | INHALATION_SPRAY | Freq: Every day | RESPIRATORY_TRACT | Status: DC
  Administered 2021-03-03 – 2021-03-06 (×4): 1 via RESPIRATORY_TRACT
  Filled 2021-03-03 (×2): qty 14

## 2021-03-03 MED ORDER — BENAZEPRIL HCL 20 MG PO TABS
40.0000 mg | ORAL_TABLET | Freq: Every day | ORAL | Status: DC
  Administered 2021-03-03 – 2021-03-06 (×4): 40 mg via ORAL
  Filled 2021-03-03: qty 1
  Filled 2021-03-03 (×4): qty 2

## 2021-03-03 MED ORDER — BUPROPION HCL ER (XL) 150 MG PO TB24
300.0000 mg | ORAL_TABLET | Freq: Every day | ORAL | Status: DC
  Administered 2021-03-03 – 2021-03-06 (×4): 300 mg via ORAL
  Filled 2021-03-03 (×4): qty 2

## 2021-03-03 MED ORDER — VITAMIN D 25 MCG (1000 UNIT) PO TABS
2000.0000 [IU] | ORAL_TABLET | Freq: Every day | ORAL | Status: DC
  Administered 2021-03-03 – 2021-03-06 (×4): 2000 [IU] via ORAL
  Filled 2021-03-03 (×4): qty 2

## 2021-03-03 MED ORDER — ASPIRIN EC 81 MG PO TBEC
81.0000 mg | DELAYED_RELEASE_TABLET | Freq: Every day | ORAL | Status: DC
  Administered 2021-03-03 – 2021-03-06 (×4): 81 mg via ORAL
  Filled 2021-03-03 (×4): qty 1

## 2021-03-03 MED ORDER — PROMETHAZINE HCL 25 MG PO TABS
25.0000 mg | ORAL_TABLET | Freq: Four times a day (QID) | ORAL | Status: DC | PRN
  Administered 2021-03-03 – 2021-03-04 (×2): 25 mg via ORAL
  Filled 2021-03-03 (×4): qty 1

## 2021-03-03 MED ORDER — MIRABEGRON ER 25 MG PO TB24
50.0000 mg | ORAL_TABLET | Freq: Every day | ORAL | Status: DC
  Administered 2021-03-03 – 2021-03-06 (×4): 50 mg via ORAL
  Filled 2021-03-03: qty 1
  Filled 2021-03-03 (×4): qty 2

## 2021-03-03 MED ORDER — OXYCODONE-ACETAMINOPHEN 5-325 MG PO TABS
1.0000 | ORAL_TABLET | Freq: Two times a day (BID) | ORAL | Status: DC | PRN
  Administered 2021-03-03 – 2021-03-06 (×6): 1 via ORAL
  Filled 2021-03-03 (×6): qty 1

## 2021-03-03 MED ORDER — ALBUTEROL SULFATE HFA 108 (90 BASE) MCG/ACT IN AERS
2.0000 | INHALATION_SPRAY | Freq: Four times a day (QID) | RESPIRATORY_TRACT | Status: DC | PRN
  Administered 2021-03-06: 2 via RESPIRATORY_TRACT
  Filled 2021-03-03: qty 6.7

## 2021-03-03 MED ORDER — METOPROLOL SUCCINATE ER 50 MG PO TB24
50.0000 mg | ORAL_TABLET | Freq: Every day | ORAL | Status: DC
  Administered 2021-03-03 – 2021-03-06 (×4): 50 mg via ORAL
  Filled 2021-03-03 (×4): qty 1

## 2021-03-03 MED ORDER — PREGABALIN 100 MG PO CAPS
200.0000 mg | ORAL_CAPSULE | Freq: Three times a day (TID) | ORAL | Status: DC
  Administered 2021-03-03 – 2021-03-06 (×10): 200 mg via ORAL
  Filled 2021-03-03 (×10): qty 2

## 2021-03-03 MED ORDER — AMLODIPINE BESYLATE 10 MG PO TABS
10.0000 mg | ORAL_TABLET | Freq: Every day | ORAL | Status: DC
  Administered 2021-03-03 – 2021-03-06 (×4): 10 mg via ORAL
  Filled 2021-03-03 (×4): qty 1

## 2021-03-03 NOTE — Consult Note (Addendum)
Neurology Consultation  Reason for Consult: Vertigo and left arm weakness  Requesting Physician: Dr. Evette Doffing  CC: Left arm weakness and vertigo   History is obtained from: chart. Pt not fully cooperative  HPI: Jennifer [name] is a 60 y.o. female  with type II diabetes mellitus, peripheral vascular disease s/p L BKA 2014, hx DVT 9563, chronic diastolic heart failure, COPD, hypertension who presented to the ED for a 1 wk history of dizziness and nausea. Patient states she was in her usual state of health until last Wednesday when she began feeling dizzy. Describes it as "feeling as the room is spinning." States this lasts for hours-days and is not related to position, no obvious triggering event. Denies syncope or falls. This is associated with nausea, non-bloody vomiting, and headaches. she mentions her son had acute gastrointestinal illness last weekend which included symptoms of diarrhea and vomiting. She is vaccinated x 3 for COVID-19 and believes she received the influenza vaccine this year.   Describes the headaches as left-sided, including some of her face but doesn't radiate past her left chin. She denies lacrimation to the left side, denies photophobia involving left eye. She describes it as a constant pain involving the entire left side of her head, started posteriorly and now affects the entire left side.   She complains of weakness to her left arm which is upsetting to her because it prevents her from "holding a cigarette."   ROS: All other review of systems was negative except as noted in the HPI.      Past Medical History:  Diagnosis Date  . Cataract   . Chronic bronchitis ([place])    "I get it alot" (09/28/2013)  . Chronic diastolic heart failure (HCC)    grade 2 per 2D echocardiogram (01/2013)  . Chronic lower back pain   . Chronic pain syndrome 12/03/2011   Likely secondary to depression, "fibromyalgia", neuropathy, and obesity. Lumbar MRI 2014 no sig change from prior (2008) :  Stable hypertrophic facet disease most notable at L4-5. Stable shallow left foraminal/extraforaminal disc protrusion at L4-5. No direct neural compression.      Marland Kitchen COPD 01/08/2007   PFT's 05/2007 : FEV1/FVC 82, FEV1 64% pred, FEF 25-75% 40% predicted, 16% improvement in FEV1 with bronchodilators.     . Diabetic peripheral neuropathy ([place])   . DVT of upper extremity (deep vein thrombosis) ([place]) 03/11/2013   Secondary to PICC line. Right brachial vein, diagnosed on 03/10/2013 Coumadin for 3 months. End date 06/10/2013   . Environmental allergies    Hx: of  . Fatty liver 2003   observed on ultrasound abdomen  . Fibromyalgia   . GERD (gastroesophageal reflux disease)   . Glaucoma   . History of bacterial endocarditis 2014   Endocarditis involving mitral and tricuspid valves.  S. Aureus and GBS.   Marland Kitchen History of use of hearing aid   . Hyperlipidemia   . Hyperplastic colon polyp 12/2010   Per colonoscopy (12/2010) - Dr. Deatra Ina  . Hypertension   . 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 PVD (peripheral vascular disease) with claudication ([place])    Stents to bilateral common iliac arteries (left 2005, right 2008), on chronic plavix  . Pyelonephritis 10/28/2020  . S/P BKA (below knee amputation) unilateral ([place])    2014 L - failed limb preserving treatment. 2/2 tobacco use, DM, and cont weight bearing on surgical wound and developed gangrene   . Tobacco  abuse   . Type II diabetes mellitus with peripheral circulatory disorders, uncontrolled DX: 1993   Insulin dep. Poor control. Complicated by diabetic foot ulcer and diabetic eye disease.        Family History  Problem Relation Age of Onset  . Diverticulosis Mother   . Diabetes Mother   . Hypertension Mother   . Congestive Heart Failure Mother   . Asthma Father   . CAD Sister 91       MI at age 16 per patient.  However, she has not had a stent or CABG.   . Heart disease Sister         before age 40  . Breast cancer Neg Hx       Social History:  reports that she has been smoking cigarettes. She has a 50.00 pack-year smoking history. She has never used smokeless tobacco. She reports that she does not drink alcohol and does not use drugs.    Exam: Current vital signs: BP (!) 127/44 (BP Location: Right Arm)   Pulse 68   Temp 98.1 F (36.7 C) (Oral)   Resp 18   Ht 5\' 7"  (1.702 m)   Wt 117.9 kg   SpO2 97%   BMI 40.72 kg/m  Vital signs in last 24 hours: Temp:  [97.4 F (36.3 C)-98.7 F (37.1 C)] 98.1 F (36.7 C) (03/13 1540) Pulse Rate:  [63-71] 68 (03/13 1540) Resp:  [11-18] 18 (03/13 1540) BP: (127-151)/(44-111) 127/44 (03/13 1540) SpO2:  [87 %-100 %] 97 % (03/13 1600) Weight:  [117.9 kg] 117.9 kg (03/12 1813)   Physical Exam  Constitutional: Morbidly obese. Left bka noted Psych: Affect initially appropriate to situation, she becomes belligerent when asked multiple questions.  Eyes: No scleral injection HENT: No oropharyngeal obstruction.  MSK: no joint deformities.  Cardiovascular: Normal rate and regular rhythm.  Respiratory: Effort normal, non-labored breathing GI: Soft.  No distension. There is no tenderness.  Skin: Warm dry and intact visible skin  Neuro: Mental Status: Patient is awake, alert, oriented to person, place, month, year, and situation.  Patient is able to give a clear and coherent history.  No signs of aphasia or neglect  Cranial Nerves: II: Visual Fields are full. Pupils are equal, round, and reactive to light.    III,IV, VI: EOMI without ptosis or diplopia.  V: Facial sensation is symmetric to temperature VII: Facial movement is symmetric.  VIII: hearing is intact to voice X: No hypophonia XI: Shoulder shrug is symmetric. XII: tongue is midline without atrophy or fasciculations.  Motor: Tone is normal. Bulk is normal. 4+//5 strength was present in both upper extremities. Pt was able to hold both arms up for count of 10,  and only dropped left arm after a count of ten for reports of "pain from tennis elbow." She Was able to elevate right leg off bed, able to elevate left bka off bed.   Sensory: Sensation is symmetric to light touch and temperature in the arms and legs.  Deep Tendon Reflexes: 2+ and symmetric in the biceps and patellae.   Plantars: Toes are downgoing on right. Left bka.  Cerebellar: FNF intact bilaterally.    I have reviewed labs in epic and the results pertinent to this consultation are:   Results for Jennifer [name], Jennifer [name] (MRN [id number]) as of 03/03/2021 17:53  Ref. Range 03/03/2021 06:36  Potassium Latest Ref Range: 3.5 - 5.1 mmol/L 4.2  Chloride Latest Ref Range: 98 - 111 mmol/L 108  CO2 Latest Ref  Range: 22 - 32 mmol/L 27  Glucose Latest Ref Range: 70 - 99 mg/dL 91  BUN Latest Ref Range: 8 - 23 mg/dL 14  Creatinine Latest Ref Range: 0.44 - 1.00 mg/dL 0.93  Calcium Latest Ref Range: 8.9 - 10.3 mg/dL 9.3  Anion gap Latest Ref Range: 5 - 15  6  GFR, Estimated Latest Ref Range: >60 mL/min >60  WBC Latest Ref Range: 4.0 - 10.5 K/uL 6.5  RBC Latest Ref Range: 3.87 - 5.11 MIL/uL 4.11  Hemoglobin Latest Ref Range: 12.0 - 15.0 g/dL 12.9  HCT Latest Ref Range: 36.0 - 46.0 % 39.1  MCV Latest Ref Range: 80.0 - 100.0 fL 95.1  MCH Latest Ref Range: 26.0 - 34.0 pg 31.4  MCHC Latest Ref Range: 30.0 - 36.0 g/dL 33.0  RDW Latest Ref Range: 11.5 - 15.5 % 14.4  Platelets Latest Ref Range: 150 - 400 K/uL 123 (L)  nRBC Latest Ref Range: 0.0 - 0.2 % 0.0   I have reviewed the images obtained:  CT Head 03/02/21: no acute findings.  MRI brain 03/02/21   Assessment: 60 year old black female with multiple vascular risk factors who presented with vertigo and left arm weakness.  1. No findings on neurological exam to correlate with presenting symptoms. DDx includes TIA.   2. MRI brain: No acute intracranial abnormality. Unchanged distribution of numerous bilateral supratentorial white matter lesions. 3. MRI  cervical spine: Unchanged left foraminal disc protrusion with uncovertebral spurring at C2-3 causing mild left foraminal stenosis. No cervical spinal canal or neural foraminal stenosis. 4. MRI thoracic spine: Mild degenerative disc disease of the thoracic spine without significant spinal canal or neural foraminal stenosis at any level 5. MRI lumbar spine: Advanced hypertrophic facet degenerative changes with bilateral joint effusions at L4-5 and ligamentum flavum redundancy resulting in mild spinal canal stenosis with narrowing of the bilateral subarticular zones. No significant spinal canal or neural foraminal stenosis at the remaining levels. 6. Overall presentation is most consistent with TIA. Will need to complete TIA work up.   Recommendations: - PT/OT/Speech - Last echocardiogram was in October of 2020. Will need repeat echocardiogram for TIA work up.  - MRA head - Carotid ultrasound  - Continue ASA. May need to add Plavix pending results of stroke work up.  - BP management.  - Continue rosuvastatin  I have seen and examined the patient. I have formulated the assessment and recommendations. 60 year old black female with multiple vascular risk factors who presented with vertigo and left arm weakness. No findings on neurological exam to correlate with presenting symptoms. DDx includes TIA. Recommendations for stroke management and work up as above.   Electronically signed: Dr. Kerney Elbe

## 2021-03-03 NOTE — Consult Note (Incomplete Revision)
Neurology Consultation  Reason for Consult: vertigo and left arm weakness Requesting Physician: vincent  CC: left arm weakness and vertigo   History is obtained from: chart. Pt not fully cooperative  HPI: Jennifer [name] is a 60 y.o. female  with type II diabetes mellitus, peripheral vascular disease s/p L BKA 2014, hx DVT 7169, chronic diastolic heart failure, COPD, hypertension presenting ED for 1 wk history of dizziness and nausea. Patient states she was in her usual state of health until last Wednesday when she began feeling dizzy. Describes it as "feeling as the room is spinning." States this lasts for hours-days and is not related to position, no obvious triggering event. Denies syncope or falls. This is associated with nausea, non-bloody vomiting, and headaches. she mentions her son had acute gastrointestinal illness last weekend which included symptoms of diarrhea and vomiting. She is vaccinated x3 for COVID-19 and believes she received the influenza vaccine this year.   Describes the headaches as left-sided, including some of her face but doesn't radiate past her left chin. She denies lacrimation to the left side, denies photophobia involving left eye. She describes it as a constant pain involving the entire left side of her head, started posteriorly and now affects the entire left side.   She complains of weakness to her left arm which is upsetting to her because it prevents her from "holding a cigarette."   ROS: All other review of systems was negative except as noted in the HPI.      Past Medical History:  Diagnosis Date  . Cataract   . Chronic bronchitis ([place])    "I get it alot" (09/28/2013)  . Chronic diastolic heart failure (HCC)    grade 2 per 2D echocardiogram (01/2013)  . Chronic lower back pain   . Chronic pain syndrome 12/03/2011   Likely secondary to depression, "fibromyalgia", neuropathy, and obesity. Lumbar MRI 2014 no sig change from prior (2008) : Stable hypertrophic  facet disease most notable at L4-5. Stable shallow left foraminal/extraforaminal disc protrusion at L4-5. No direct neural compression.      Marland Kitchen COPD 01/08/2007   PFT's 05/2007 : FEV1/FVC 82, FEV1 64% pred, FEF 25-75% 40% predicted, 16% improvement in FEV1 with bronchodilators.     . Diabetic peripheral neuropathy ([place])   . DVT of upper extremity (deep vein thrombosis) ([place]) 03/11/2013   Secondary to PICC line. Right brachial vein, diagnosed on 03/10/2013 Coumadin for 3 months. End date 06/10/2013   . Environmental allergies    Hx: of  . Fatty liver 2003   observed on ultrasound abdomen  . Fibromyalgia   . GERD (gastroesophageal reflux disease)   . Glaucoma   . History of bacterial endocarditis 2014   Endocarditis involving mitral and tricuspid valves.  S. Aureus and GBS.   Marland Kitchen History of use of hearing aid   . Hyperlipidemia   . Hyperplastic colon polyp 12/2010   Per colonoscopy (12/2010) - Dr. Deatra Ina  . Hypertension   . 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 PVD (peripheral vascular disease) with claudication ([place])    Stents to bilateral common iliac arteries (left 2005, right 2008), on chronic plavix  . Pyelonephritis 10/28/2020  . S/P BKA (below knee amputation) unilateral ([place])    2014 L - failed limb preserving treatment. 2/2 tobacco use, DM, and cont weight bearing on surgical wound and developed gangrene   . Tobacco abuse   . Type II diabetes  mellitus with peripheral circulatory disorders, uncontrolled DX: 1993   Insulin dep. Poor control. Complicated by diabetic foot ulcer and diabetic eye disease.        Family History  Problem Relation Age of Onset  . Diverticulosis Mother   . Diabetes Mother   . Hypertension Mother   . Congestive Heart Failure Mother   . Asthma Father   . CAD Sister 22       MI at age 64 per patient.  However, she has not had a stent or CABG.   . Heart disease Sister        before age 40  .  Breast cancer Neg Hx       Social History:  reports that she has been smoking cigarettes. She has a 50.00 pack-year smoking history. She has never used smokeless tobacco. She reports that she does not drink alcohol and does not use drugs.    Exam: Current vital signs: BP (!) 127/44 (BP Location: Right Arm)   Pulse 68   Temp 98.1 F (36.7 C) (Oral)   Resp 18   Ht 5\' 7"  (1.702 m)   Wt 117.9 kg   SpO2 97%   BMI 40.72 kg/m  Vital signs in last 24 hours: Temp:  [97.4 F (36.3 C)-98.7 F (37.1 C)] 98.1 F (36.7 C) (03/13 1540) Pulse Rate:  [63-71] 68 (03/13 1540) Resp:  [11-18] 18 (03/13 1540) BP: (127-151)/(44-111) 127/44 (03/13 1540) SpO2:  [87 %-100 %] 97 % (03/13 1600) Weight:  [117.9 kg] 117.9 kg (03/12 1813)   Physical Exam  Constitutional: Appears well-developed and well-nourished. Left bka noted Psych: Affect appropriate to situation,  Becomes belligerent when asked multiple questions.  Eyes: No scleral injection HENT: No oropharyngeal obstruction.  MSK: no joint deformities.  Cardiovascular: Normal rate and regular rhythm.  Respiratory: Effort normal, non-labored breathing GI: Soft.  No distension. There is no tenderness.  Skin: Warm dry and intact visible skin  Neuro: Mental Status: Patient is awake, alert, oriented to person, place, month, year, and situation.  Patient is able to give a clear and coherent history.  No signs of aphasia or neglect  Cranial Nerves: II: Visual Fields are full. Pupils are equal, round, and reactive to light.    III,IV, VI: EOMI without ptosis or diploplia.  V: Facial sensation is symmetric to temperature VII: Facial movement is symmetric.  VIII: hearing is intact to voice X: Uvula elevates symmetrically XI: Shoulder shrug is symmetric. XII: tongue is midline without atrophy or fasciculations.  Motor: Tone is normal. Bulk is normal. 4+//5 strength was present in both upper extremities. Pt was able to hold both arms up for count  of 10, and only dropped left arm after a count of ten for reports of "pain from tennis elbow." She Was able to elevate right leg off bed, able to elevate left bka off bed.    Sensory: Sensation is symmetric to light touch and temperature in the arms and legs.  Deep Tendon Reflexes: 2+ and symmetric in the biceps and patellae.   Plantars: Toes are downgoing on right. Left bka.  Cerebellar: FNF intact bilaterally.     I have reviewed labs in epic and the results pertinent to this consultation are:   Results for Jennifer [name], Jennifer [name] (MRN [id number]) as of 03/03/2021 17:53  Ref. Range 03/03/2021 06:36  Potassium Latest Ref Range: 3.5 - 5.1 mmol/L 4.2  Chloride Latest Ref Range: 98 - 111 mmol/L 108  CO2 Latest Ref Range: 22 -  32 mmol/L 27  Glucose Latest Ref Range: 70 - 99 mg/dL 91  BUN Latest Ref Range: 8 - 23 mg/dL 14  Creatinine Latest Ref Range: 0.44 - 1.00 mg/dL 0.93  Calcium Latest Ref Range: 8.9 - 10.3 mg/dL 9.3  Anion gap Latest Ref Range: 5 - 15  6  GFR, Estimated Latest Ref Range: >60 mL/min >60  WBC Latest Ref Range: 4.0 - 10.5 K/uL 6.5  RBC Latest Ref Range: 3.87 - 5.11 MIL/uL 4.11  Hemoglobin Latest Ref Range: 12.0 - 15.0 g/dL 12.9  HCT Latest Ref Range: 36.0 - 46.0 % 39.1  MCV Latest Ref Range: 80.0 - 100.0 fL 95.1  MCH Latest Ref Range: 26.0 - 34.0 pg 31.4  MCHC Latest Ref Range: 30.0 - 36.0 g/dL 33.0  RDW Latest Ref Range: 11.5 - 15.5 % 14.4  Platelets Latest Ref Range: 150 - 400 K/uL 123 (L)  nRBC Latest Ref Range: 0.0 - 0.2 % 0.0   I have reviewed the images obtained:  CT Head 03/02/21: no acute findings. MRI brain 03/02/21  Impression:   60 year old vasculopathic black female with history outlined above presented for vertigo as well as reports of left arm pain, not evident to this exam.  Patient has reportedly received phenergan for nausea.  Consider meclizine if vertigo persists.    Recommendations: -  MRI cervical spine to assess for possible causes of percieved  weakness Continue with current medical management. We will follow.

## 2021-03-03 NOTE — H&P (Signed)
Date: 03/02/2021               Patient Name:  Jennifer [name] MRN: [id number]  DOB: [birth year] Age / Sex: 60 y.o., female   PCP: Angelica Pou, MD         Medical Service: Internal Medicine Teaching Service         Attending Physician: Dr. Evette Doffing, MD    First Contact: Dr. Konrad Penta Pager: [phone]  Second Contact: Dr. Marianna Payment Pager: [phone]       After Hours (After 5p/  First Contact Pager: [phone]  weekends / holidays): Second Contact Pager: [phone]   Chief Complaint: dizziness  History of Present Illness:  Jennifer [name] is 60yo person with type II diabetes mellitus, peripheral vascular disease s/p L BKA 2014, hx DVT 2831, chronic diastolic heart failure, COPD, hypertension presenting ED for 1 wk history of dizziness and nausea. Patient states she was in her usual state of health until last Wednesday when she began feeling dizzy. Describes it as "feeling as the room is spinning." States this lasts for hours-days and is not related to position, no obvious triggering event. Denies syncope or falls. This is associated with nausea, non-bloody vomiting, and headaches. Describes the headaches as left-sided, including some of her face. She says she has not been able to eat well since onset of symptoms, although was able to eat some breakfast this morning. Mentions some epigastric intermittent abdominal cramping prior to vomiting. Denies fevers, chills, palpitations, dyspnea, dysuria. Patient does have chronic hearing loss requiring hearing aids as well as tinnitus. Of note, she mentions her son had acute gastrointestinal illness last weekend which included symptoms of diarrhea and vomiting. She is vaccinated x3 for COVID-19 and believes she received the influenza vaccine this year.   Of note, patient reports compliance to medications. States one of her blood pressure medications was recently changed by her PCP at last visit (2/24). She mentions her blood sugars are often extremely high,  including a recent episode with sugars >600. Says her sugars were in 200's this morning.   Past Medical History: Past Medical History:  Diagnosis Date  . Cataract   . Chronic bronchitis ([place])    "I get it alot" (09/28/2013)  . Chronic diastolic heart failure (HCC)    grade 2 per 2D echocardiogram (01/2013)  . Chronic lower back pain   . Chronic pain syndrome 12/03/2011   Likely secondary to depression, "fibromyalgia", neuropathy, and obesity. Lumbar MRI 2014 no sig change from prior (2008) : Stable hypertrophic facet disease most notable at L4-5. Stable shallow left foraminal/extraforaminal disc protrusion at L4-5. No direct neural compression.      Marland Kitchen COPD 01/08/2007   PFT's 05/2007 : FEV1/FVC 82, FEV1 64% pred, FEF 25-75% 40% predicted, 16% improvement in FEV1 with bronchodilators.     . Diabetic peripheral neuropathy ([place])   . DVT of upper extremity (deep vein thrombosis) ([place]) 03/11/2013   Secondary to PICC line. Right brachial vein, diagnosed on 03/10/2013 Coumadin for 3 months. End date 06/10/2013   . Environmental allergies    Hx: of  . Fatty liver 2003   observed on ultrasound abdomen  . Fibromyalgia   . GERD (gastroesophageal reflux disease)   . Glaucoma   . History of bacterial endocarditis 2014   Endocarditis involving mitral and tricuspid valves.  S. Aureus and GBS.   Marland Kitchen History of use of hearing aid   . Hyperlipidemia   . Hyperplastic colon polyp 12/2010  Per colonoscopy (12/2010) - Dr. Deatra Ina  . Hypertension   . 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 PVD (peripheral vascular disease) with claudication ([place])    Stents to bilateral common iliac arteries (left 2005, right 2008), on chronic plavix  . Pyelonephritis 10/28/2020  . S/P BKA (below knee amputation) unilateral ([place])    2014 L - failed limb preserving treatment. 2/2 tobacco use, DM, and cont weight bearing on surgical wound and developed gangrene   .  Tobacco abuse   . Type II diabetes mellitus with peripheral circulatory disorders, uncontrolled DX: 1993   Insulin dep. Poor control. Complicated by diabetic foot ulcer and diabetic eye disease.     Medications: Albuterol 2 puffs q6h PRN Amlodipine 10mg  qd ASA 81mg  qd Baclofen PRN Benazepril 40mg  qd Buproprion 300mg  qd Tresiba 200U qd SSI w/ meals Metformin 500mg  qd Toprol 50mg  qd Mirabegron 50mg  qd Omeprazole 20mg  qd Oxybutynin 5mg  qd Percocet 5-325mg  q8h PRN  Ozempic 0.5mg  q week Pregabalin 200mg  qd Rosuvastatin 20mg  qd Anoro ellipta 1 puff qd  Allergies: Allergies as of 03/02/2021 - Review Complete 02/15/2021  Allergen Reaction Noted  . Abilify [aripiprazole] Other (See Comments) 11/09/2015  . Iohexol Other (See Comments) 08/17/2007  . Ivp dye [iodinated diagnostic agents] Other (See Comments) 05/10/2013  . Morphine sulfate Itching and Rash    Family History:  Family History  Problem Relation Age of Onset  . Diverticulosis Mother   . Diabetes Mother   . Hypertension Mother   . Congestive Heart Failure Mother   . Asthma Father   . CAD Sister 7       MI at age 1 per patient.  However, she has not had a stent or CABG.   . Heart disease Sister        before age 50  . Breast cancer Neg Hx    Social History:  Patient lives with multiple family members here in [place]. Uses a walker for ambulation, can complete ADL's by herself but needs assistance with IADL's. Current smoker 0.5ppd, total 45 pack-year history. Occasional alcohol use. Previously smoked crack/cocaine, last use 1989. Previously smoked marijuana, last use 2012.   Review of Systems: A complete ROS was negative except as per HPI.   Physical Exam: Blood pressure (!) 151/57, pulse 66, temperature 98 F (36.7 C), temperature source Oral, resp. rate 14, height 5\' 7"  (1.702 m), weight 117.9 kg, SpO2 98 %. General: Laying in bed, non ill-appearing, non-diaphoretic, no acute distress HENT: Normocephalic,  atraumatic. Moist mucous membranes. CV: Regular rate, rhythm. No murmurs, rubs, gallops appreciated. Pulm: Normal work of breathing on room air. Clear to auscultation bilaterally. No wheezing, rales, rhonchi. GI: Soft, non-distended, non-tender. Normoactive bowel sounds. MSK: 1+ pitting edema bilaterally to mid-tibia. Normal bulk, tone. L AKA. Skin: Warm, dry. No rashes or lesions visible. Neuro: Awake, alert, oriented x4. CN in tact. Horizontal nystagmus present on L gaze. Sensation decreased on left upper extremity. Motor 5/5 RUE, 4/5 LUE, 5/5 RLE. Finger to nose normal bilaterally. Psych: Stuttering present, normal mood. Poor insight to medical conditions.  CBC Latest Ref Rng & Units 03/02/2021 11/08/2020 11/07/2020  WBC 4.0 - 10.5 K/uL 7.2 9.1 10.4  Hemoglobin 12.0 - 15.0 g/dL 13.4 10.8(L) 11.8(L)  Hematocrit 36.0 - 46.0 % 40.5 33.9(L) 37.1  Platelets 150 - 400 K/uL 128(L) 243 298   CMP Latest Ref Rng & Units 03/02/2021 11/08/2020 11/07/2020  Glucose 70 - 99 mg/dL 165(H)  226(H) 82  BUN 8 - 23 mg/dL 18 16 15   Creatinine 0.44 - 1.00 mg/dL 1.14(H) 1.08(H) 1.11(H)  Sodium 135 - 145 mmol/L 139 141 142  Potassium 3.5 - 5.1 mmol/L 4.4 4.3 4.1  Chloride 98 - 111 mmol/L 104 107 107  CO2 22 - 32 mmol/L 26 27 27   Calcium 8.9 - 10.3 mg/dL 9.3 8.7(L) 9.2  Total Protein 6.5 - 8.1 g/dL 6.9 - 6.7  Total Bilirubin 0.3 - 1.2 mg/dL 0.6 - 0.2(L)  Alkaline Phos 38 - 126 U/L 67 - 80  AST 15 - 41 U/L 25 - 20  ALT 0 - 44 U/L 23 - 28   EKG: personally reviewed my interpretation is normal sinus rhythm, left axis deviation (unchanged from 10/2020)  CT Head: No acute intracranial findings.  Assessment & Plan by Problem: Jennifer [name] is 60yo person (she/her) with type II diabetes mellitus, peripheral vascular disease s/p L BKA 0165, chronic diastolic heart failure, COPD, hypertension admitted 3/12 for dizziness and vomiting likely due to viral gastroenteritis, found to have concerning neurological  symptoms although unclear chronicity.  Active Problems:   Dizziness  #Dizziness #Nausea/Vomiting On arrival, patient afebrile, hemodynamically stable sating well on room air. In ED given 0.5L bolus IVF. Patient denies chest pain, palpitations, syncopal events. This episodic dizziness lasts hours-days and is not positional. L horizontal nystagmus most consistent with peripheral lesion, possibly labrynthitis/neuritis. CT head negative, MRI pending to assess for infarction. Patient also has had family members with gastrointestinal symptoms, presentation could be 2/2 viral gastroenteritis. No overt abnormalities on initial lab work. Can also consider Meniere's disease, although hearing loss and tinnitus appear to be more chronic issue. Will monitor overnight for supportive care and have her follow-up with PCP within the next week. - F/u MRI brain - Zofran 4mg  q6h PRN - SLP/PT/OT  #LUE mild weakness, numbness on exam Found to have decreased sensation and mild weakness of left upper extremity. Denies injury or falls. When questioned, mentions that this is a new finding. CT head negative, obtaining MRI this evening for further assessment. Suggest re-examination in the morning for further evaluation.   Chronic: #Type II diabetes mellitus Last A1c 7.8 in January 2022. On Tresiba, metformin, and SSI. Glucose 165 on arrival.    #Chronic diastolic heart failure Lungs CTA, 1+ pitting edema. Unclear baseline. Last Echo 2020. Continue home medications.  #COPD No change in respiratory status, no increased oxygen requirement. Continue home meds.   #Hypertension Will continue home medications.  Dispo: Admit patient to Observation with expected length of stay less than 2 midnights.  Signed: Sanjuan Dame, MD 03/02/2021, 6:49 PM  Pager: [phone] After 5pm on weekdays and 1pm on weekends: On Call pager: [phone]

## 2021-03-03 NOTE — ED Notes (Signed)
Admitting md at bedside

## 2021-03-03 NOTE — ED Notes (Signed)
pts bed was wet sheets changed and new pure wick placed

## 2021-03-03 NOTE — Evaluation (Addendum)
Physical Therapy Evaluation Patient Details Name: Jennifer [name] MRN: [id number] DOB: 04-23-1961 Today's Date: 03/03/2021   History of Present Illness  Pt is a 60 y.o. F admitted 3/12 with 1 week history of dizziness and nausea. MRI negative. Significant PMH: DM2, PVD s/p L BKA, DVT, CHF, COPD, HTN.  Clinical Impression  Prior to admission, pt lives with her family, is a household ambulator with a Radiation protection practitioner and uses a wheelchair for community mobility. Pt is independent with ADL's. On PT evaluation, pt reports dizziness, "room spinning," being present for several hours since breakfast. Pt reports episodes can last from minutes to hours, endorses ear ringing and states she wears hearing aids. Pt also states she had vertigo in 2014 and it was "not like this." Upon assessment, pt with LUE weakness compared to RUE and decreased light touch in ulnar distribution. She is requiring up to minA for bed mobility and deferring transferring out of bed today. Pt with left eye strabismus, but unable to tolerate smooth pursuits due to nausea. Recommending further vestibular assessment tomorrow.     Follow Up Recommendations Home health PT;Supervision for mobility/OOB    Equipment Recommendations  None recommended by PT    Recommendations for Other Services       Precautions / Restrictions Precautions Precautions: Fall;Other (comment) Precaution Comments: L prosthetic in room Restrictions Weight Bearing Restrictions: No      Mobility  Bed Mobility Overal bed mobility: Needs Assistance Bed Mobility: Supine to Sit;Sit to Supine     Supine to sit: Supervision Sit to supine: Min assist   General bed mobility comments: MinA for RLE assist back into bed    Transfers                 General transfer comment: deferred by pt  Ambulation/Gait                Stairs            Wheelchair Mobility    Modified Rankin (Stroke Patients Only)       Balance Overall balance  assessment: Needs assistance Sitting-balance support: Feet supported Sitting balance-Leahy Scale: Good                                       Pertinent Vitals/Pain Pain Assessment: Faces Faces Pain Scale: Hurts little more Pain Location: L sided headache Pain Descriptors / Indicators: Pressure Pain Intervention(s): Limited activity within patient's tolerance;Monitored during session    Home Living Family/patient expects to be discharged to:: Private residence Living Arrangements: Children;Other relatives (sons, sister, sister's husband) Available Help at Discharge: Family;Available 24 hours/day Type of Home: House Home Access: Stairs to enter Entrance Stairs-Rails: Left Entrance Stairs-Number of Steps: 4-5 Home Layout: One level Home Equipment: Bedside commode;Tub bench;Shower seat;Wheelchair - [employer] - 4 wheels;Walker - 2 wheels Additional Comments: mostly uses rollator; walks inside the house with rollator and uses WC outside of house.    Prior Function Level of Independence: Needs assistance   Gait / Transfers Assistance Needed: Rollator  ADL's / Homemaking Assistance Needed: independent        Hand Dominance        Extremity/Trunk Assessment   Upper Extremity Assessment Upper Extremity Assessment: RUE deficits/detail;LUE deficits/detail RUE Deficits / Details: Shoulder flexion AROM to ~110 degrees. Grossly 4/5 strength LUE Deficits / Details: Shoulder flexion 3+/5, elbow flexion/extension 4/.5, weak hand grip    Lower Extremity  Assessment Lower Extremity Assessment: RLE deficits/detail;LLE deficits/detail RLE Deficits / Details: Grossly 4+/5 LLE Deficits / Details: BKA. Hip grossly 4+/5 LLE Sensation: decreased light touch    Cervical / Trunk Assessment Cervical / Trunk Assessment: Other exceptions Cervical / Trunk Exceptions: increased body habitus  Communication   Communication: No difficulties  Cognition Arousal/Alertness:  Awake/alert Behavior During Therapy: WFL for tasks assessed/performed Overall Cognitive Status: Within Functional Limits for tasks assessed                                        General Comments      Exercises     Assessment/Plan    PT Assessment Patient needs continued PT services  PT Problem List Decreased strength;Decreased activity tolerance;Decreased mobility;Decreased balance       PT Treatment Interventions DME instruction;Stair training;Gait training;Functional mobility training;Therapeutic activities;Therapeutic exercise;Balance training;Patient/family education    PT Goals (Current goals can be found in the Care Plan section)  Acute Rehab PT Goals Patient Stated Goal: less dizziness PT Goal Formulation: With patient Time For Goal Achievement: 03/17/21 Potential to Achieve Goals: Good    Frequency Min 3X/week   Barriers to discharge        Co-evaluation               AM-PAC PT "6 Clicks" Mobility  Outcome Measure Help needed turning from your back to your side while in a flat bed without using bedrails?: None Help needed moving from lying on your back to sitting on the side of a flat bed without using bedrails?: None Help needed moving to and from a bed to a chair (including a wheelchair)?: A Little Help needed standing up from a chair using your arms (e.g., wheelchair or bedside chair)?: A Little Help needed to walk in hospital room?: A Little Help needed climbing 3-5 steps with a railing? : A Lot 6 Click Score: 19    End of Session   Activity Tolerance: Other (comment) (limited by dizziness, nausea) Patient left: in bed;with call bell/phone within reach   PT Visit Diagnosis: Dizziness and giddiness (R42)    Time: 7893-8101 PT Time Calculation (min) (ACUTE ONLY): 24 min   Charges:   PT Evaluation $PT Eval Moderate Complexity: 1 Mod PT Treatments $Therapeutic Activity: 8-22 mins        Jennifer [name], PT, DPT Acute  Rehabilitation Services Pager [phone] Office [phone]   Jennifer [name] 03/03/2021, 3:57 PM

## 2021-03-03 NOTE — Progress Notes (Addendum)
HD#0 Subjective:  Overnight Events: Admitted overnight, continues to have left-sided headache and left upper extremity/lower extremity weakness  Patient evaluated bedside this morning, she continues to endorse left-sided headache as well as left upper extremity and lower extremity weakness.  Continues to endorse dizziness.  She endorses the symptoms have been occurring for 1 to 2 weeks.  She denies anything like this before in the past.  Objective:  Vital signs in last 24 hours: Vitals:   03/03/21 0453 03/03/21 0454 03/03/21 0455 03/03/21 0457  BP:      Pulse: 67 67    Resp: 14 14 18    Temp:    98.7 F (37.1 C)  TempSrc:      SpO2: 94% 95%    Weight:      Height:       Supplemental O2: Room Air SpO2: 95 %   Physical Exam:  Constitutional: Patient in mild distress.  Appears uncomfortable. HENT: normocephalic atraumatic, mucous membranes moist.  Horizontal nystagmus present.  Eyes: conjunctiva non-erythematous Neck: supple Cardiovascular: regular rate and rhythm, no m/r/g Pulmonary/Chest: normal work of breathing on room air, lungs clear to auscultation bilaterally Abdominal: soft, non-tender, non-distended MSK: normal bulk and tone Neurological: alert & oriented x 3, left-sided weakness Skin: warm and dry Psych: Normal mood  Filed Weights   03/02/21 1813  Weight: 117.9 kg     Intake/Output Summary (Last 24 hours) at 03/03/2021 0540 Last data filed at 03/02/2021 2250 Gross per 24 hour  Intake 500 ml  Output --  Net 500 ml   Net IO Since Admission: 500 mL [03/03/21 0540]  Recent Labs    03/02/21 2154  GLUCAP 165*     Pertinent Labs: CBC Latest Ref Rng & Units 03/02/2021 11/08/2020 11/07/2020  WBC 4.0 - 10.5 K/uL 7.2 9.1 10.4  Hemoglobin 12.0 - 15.0 g/dL 13.4 10.8(L) 11.8(L)  Hematocrit 36.0 - 46.0 % 40.5 33.9(L) 37.1  Platelets 150 - 400 K/uL 128(L) 243 298    CMP Latest Ref Rng & Units 03/02/2021 11/08/2020 11/07/2020  Glucose 70 - 99 mg/dL 165(H)  226(H) 82  BUN 8 - 23 mg/dL 18 16 15   Creatinine 0.44 - 1.00 mg/dL 1.14(H) 1.08(H) 1.11(H)  Sodium 135 - 145 mmol/L 139 141 142  Potassium 3.5 - 5.1 mmol/L 4.4 4.3 4.1  Chloride 98 - 111 mmol/L 104 107 107  CO2 22 - 32 mmol/L 26 27 27   Calcium 8.9 - 10.3 mg/dL 9.3 8.7(L) 9.2  Total Protein 6.5 - 8.1 g/dL 6.9 - 6.7  Total Bilirubin 0.3 - 1.2 mg/dL 0.6 - 0.2(L)  Alkaline Phos 38 - 126 U/L 67 - 80  AST 15 - 41 U/L 25 - 20  ALT 0 - 44 U/L 23 - 28    Imaging: CT HEAD WO CONTRAST  Result Date: 03/02/2021 CLINICAL DATA:  Dizziness, weakness EXAM: CT HEAD WITHOUT CONTRAST TECHNIQUE: Contiguous axial images were obtained from the base of the skull through the vertex without intravenous contrast. COMPARISON:  10/28/2020 FINDINGS: Brain: No evidence of acute infarction, hemorrhage, hydrocephalus, extra-axial collection or mass lesion/mass effect. Vascular: Atherosclerotic calcifications involving the large vessels of the skull base. No unexpected hyperdense vessel. Skull: Normal. Negative for fracture or focal lesion. Sinuses/Orbits: No acute finding. Other: None. IMPRESSION: No acute intracranial findings. Electronically Signed   By: Davina Poke D.O.   On: 03/02/2021 17:56   MR BRAIN WO CONTRAST  Result Date: 03/02/2021 CLINICAL DATA:  Dizziness and left arm numbness EXAM: MRI HEAD WITHOUT  CONTRAST TECHNIQUE: Multiplanar, multiecho pulse sequences of the brain and surrounding structures were obtained without intravenous contrast. COMPARISON:  04/02/2020. FINDINGS: Brain: No acute infarct, mass effect or extra-axial collection. No acute or chronic hemorrhage. Unchanged distribution of numerous bilateral supratentorial white matter lesions. The midline structures are normal. Vascular: Major flow voids are preserved. Skull and upper cervical spine: Normal calvarium and skull base. Visualized upper cervical spine and soft tissues are normal. Sinuses/Orbits:No paranasal sinus fluid levels or advanced  mucosal thickening. No mastoid or middle ear effusion. Normal orbits. IMPRESSION: 1. No acute intracranial abnormality. 2. Unchanged distribution of numerous bilateral supratentorial white matter lesions. Electronically Signed   By: Ulyses Jarred M.D.   On: 03/02/2021 23:35    Assessment/Plan:   Active Problems:   Dizziness   Patient Summary: Jennifer [name] is 60yo person (she/her) with type II diabetes mellitus, peripheral vascular disease s/p L BKA 7366, chronic diastolic heart failure, COPD, hypertension admitted 3/12 for dizziness and vomiting likely due to viral gastroenteritis, found to have concerning neurological symptoms although unclear chronicity.  Vestibular symptoms with focal neurological deficits Patient continues to endorse symptoms of dizziness as well as nausea vomiting.  MRI of the brain without contrast, unchanged distribution of numerous bilateral supratentorial white matter lesions.  I discussed this with radiology who believe the pattern of lesions is more consistent with demyelinating then ischemic process.  Differential includes multiple sclerosis, complex migraine, and vestibular neuritis. -Consulted neurology, appreciate their recommendations -Treat for vestibular migraine, Phenergan 25 mg every 6 hours as needed nausea, vomiting, migraines  Thrombocytopenia Patient has history of thrombocytopenia, for the past 5 or 6 months. -We will order smear as well as reticulocyte count.  Chronic: #Type II diabetes mellitus Glucose well controlled during admission with SSI   #Hypertension Patient with intermittent hypertensive episodes during her admission, will consider increasing dose of home medications or adding on additional medications.  Diet: Heart healthy IVF: PO intake VTE: enoxaparin (LOVENOX) injection 40 mg Start: 03/02/21 2100 Code: Full PT/OT: Pending  Anticipated discharge to Home in 2-3 days pending further evaluation of her migraines  [place]  Internal Medicine Resident PGY-1 [place]  Pager: [phone]  Please contact the on call pager after 5 pm and on weekends at [phone].

## 2021-03-03 NOTE — Evaluation (Signed)
Clinical/Bedside Swallow Evaluation Patient Details  Name: Jennifer [name] MRN: [id number] Date of Birth: Nov 26, 1961  Today's Date: 03/03/2021 Time: SLP Start Time (ACUTE ONLY): 6644 SLP Stop Time (ACUTE ONLY): 0935 SLP Time Calculation (min) (ACUTE ONLY): 11.72 min  Past Medical History:  Past Medical History:  Diagnosis Date  . Cataract   . Chronic bronchitis ([place])    "I get it alot" (09/28/2013)  . Chronic diastolic heart failure (HCC)    grade 2 per 2D echocardiogram (01/2013)  . Chronic lower back pain   . Chronic pain syndrome 12/03/2011   Likely secondary to depression, "fibromyalgia", neuropathy, and obesity. Lumbar MRI 2014 no sig change from prior (2008) : Stable hypertrophic facet disease most notable at L4-5. Stable shallow left foraminal/extraforaminal disc protrusion at L4-5. No direct neural compression.      Marland Kitchen COPD 01/08/2007   PFT's 05/2007 : FEV1/FVC 82, FEV1 64% pred, FEF 25-75% 40% predicted, 16% improvement in FEV1 with bronchodilators.     . Diabetic peripheral neuropathy ([place])   . DVT of upper extremity (deep vein thrombosis) ([place]) 03/11/2013   Secondary to PICC line. Right brachial vein, diagnosed on 03/10/2013 Coumadin for 3 months. End date 06/10/2013   . Environmental allergies    Hx: of  . Fatty liver 2003   observed on ultrasound abdomen  . Fibromyalgia   . GERD (gastroesophageal reflux disease)   . Glaucoma   . History of bacterial endocarditis 2014   Endocarditis involving mitral and tricuspid valves.  S. Aureus and GBS.   Marland Kitchen History of use of hearing aid   . Hyperlipidemia   . Hyperplastic colon polyp 12/2010   Per colonoscopy (12/2010) - Dr. Deatra Ina  . Hypertension   . 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 PVD (peripheral vascular disease) with claudication ([place])    Stents to bilateral common iliac arteries (left 2005, right 2008), on chronic plavix  . Pyelonephritis 10/28/2020  . S/P  BKA (below knee amputation) unilateral ([place])    2014 L - failed limb preserving treatment. 2/2 tobacco use, DM, and cont weight bearing on surgical wound and developed gangrene   . Tobacco abuse   . Type II diabetes mellitus with peripheral circulatory disorders, uncontrolled DX: 1993   Insulin dep. Poor control. Complicated by diabetic foot ulcer and diabetic eye disease.     Past Surgical History:  Past Surgical History:  Procedure Laterality Date  . ABDOMINAL HYSTERECTOMY  1997   secondary to uterine fibroids  . AMPUTATION Left 08/31/2013   Procedure: AMPUTATION RAY;  Surgeon: Newt Minion, MD;  Location: [place];  Service: Orthopedics;  Laterality: Left;  Left Foot 5th Ray Amputation  . AMPUTATION Left 09/28/2013   Procedure: Left Midfoot amputation;  Surgeon: Newt Minion, MD;  Location: [place];  Service: Orthopedics;  Laterality: Left;  Left Midfoot amputation  . AMPUTATION Left 10/14/2013   Procedure: AMPUTATION BELOW KNEE- left;  Surgeon: Newt Minion, MD;  Location: [facility];  Service: Orthopedics;  Laterality: Left;  Left Below Knee Amputation   . AMPUTATION TOE Right 01/15/2017   Procedure: AMPUTATION 5th TOE RIGHT FOOT;  Surgeon: Edrick Kins, DPM;  Location: [place];  Service: Podiatry;  Laterality: Right;  . APPLICATION OF WOUND VAC  04/01/2019   Procedure: Application Of Wound Vac;  Surgeon: Newt Minion, MD;  Location: [facility];  Service: Orthopedics;;  . BLADDER SURGERY     bladder  reconstruction surgery  . BREAST BIOPSY     multiple-benign per pt  . COLONOSCOPY    . ESOPHAGOGASTRODUODENOSCOPY N/A 09/20/2013   Procedure: ESOPHAGOGASTRODUODENOSCOPY (EGD);  Surgeon: Jerene Bears, MD;  Location: [facility];  Service: Gastroenterology;  Laterality: N/A;  . FOOT AMPUTATION THROUGH METATARSAL Left 09/28/2013  . GANGLION CYST EXCISION     multiple  . PERIPHERAL VASCULAR INTERVENTION     stents in lower ext  . SHOULDER ARTHROSCOPY Right 11/11/2019   RIGHT SHOULDER ARTHROSCOPY AND  DEBRIDEMENT   . SHOULDER ARTHROSCOPY Right 11/11/2019   Procedure: RIGHT SHOULDER ARTHROSCOPY AND DEBRIDEMENT;  Surgeon: Newt Minion, MD;  Location: [place];  Service: Orthopedics;  Laterality: Right;  . SHOULDER ARTHROSCOPY W/ ROTATOR CUFF REPAIR Bilateral   . SKIN SPLIT GRAFT Bilateral 05/13/2013   Procedure: Right and Left Foot Allograft Skin Graft;  Surgeon: Newt Minion, MD;  Location: [place];  Service: Orthopedics;  Laterality: Bilateral;  Right and Left Foot Allograft Skin Graft  . STUMP REVISION Left 04/01/2019   Procedure: REVISION LEFT BELOW KNEE AMPUTATION;  Surgeon: Newt Minion, MD;  Location: [place];  Service: Orthopedics;  Laterality: Left;  . TEE WITHOUT CARDIOVERSION N/A 01/31/2013   Procedure: TRANSESOPHAGEAL ECHOCARDIOGRAM (TEE);  Surgeon: Fay Records, MD;  Location: [place];  Service: Cardiovascular;  Laterality: N/A;  Rm [phone]  . TEE WITHOUT CARDIOVERSION N/A 03/10/2013   Procedure: TRANSESOPHAGEAL ECHOCARDIOGRAM (TEE);  Surgeon: Larey Dresser, MD;  Location: [place];  Service: Cardiovascular;  Laterality: N/A;  Rm. [phone]  . TOE AMPUTATION Left 08/31/2013   4TH & 5 TH TOE   . TONSILLECTOMY    . TUBAL LIGATION    . WRIST SURGERY Right    "for tumors" (09/28/2013)   HPI:  Pt is a 60 y.o. female with type II diabetes mellitus, peripheral vascular disease s/p L BKA 2014, DVT 4193, chronic diastolic heart failure, COPD, hypertension who presented ED secondary to 1 wk history of dizziness and nausea. MRI brain was negative. Pt was last seen by speech pathology in 2018 and a dysphagia 3 diet with thin liquids was recommended at that time.   Assessment / Plan / Recommendation Clinical Impression  Pt was seen for bedside swallow evaluation and she denied a history of dysphagia. Oral mechanism exam was [facility]. Pt was edentulous, but stated that she has been eating without teeth since she was 29 and does has not had to restrict her diet due to this. She tolerated all solids and  liquids without signs or symptoms of oropharyngeal dysphagia. A regular texture diet with thin liquids is recommended at this time and further skilled SLP services are not clinically indicated for swallowing. SLP Visit Diagnosis: Dysphagia, unspecified (R13.10)    Aspiration Risk  No limitations    Diet Recommendation Regular;Thin liquid   Liquid Administration via: Cup;Straw Medication Administration: Whole meds with liquid Postural Changes: Seated upright at 90 degrees    Other  Recommendations Oral Care Recommendations: Oral care BID   Follow up Recommendations None      Frequency and Duration            Prognosis        Swallow Study   General Date of Onset: 03/03/21 HPI: Pt is a 60 y.o. female with type II diabetes mellitus, peripheral vascular disease s/p L BKA 2014, DVT 7902, chronic diastolic heart failure, COPD, hypertension who presented ED secondary to 1 wk history of dizziness and nausea. MRI brain was negative.  Pt was last seen by speech pathology in 2018 and a dysphagia 3 diet with thin liquids was recommended at that time. Type of Study: Bedside Swallow Evaluation Previous Swallow Assessment: See HPI Diet Prior to this Study: Thin liquids (clear liquids) Temperature Spikes Noted: No Respiratory Status: Room air History of Recent Intubation: No Behavior/Cognition: Alert;Cooperative;Pleasant mood Oral Cavity Assessment: Within Functional Limits Oral Care Completed by SLP: No Oral Cavity - Dentition: Edentulous Vision: Functional for self-feeding Self-Feeding Abilities: Able to feed self Patient Positioning: Upright in bed Baseline Vocal Quality: Normal Volitional Cough: Strong Volitional Swallow: Able to elicit    Oral/Motor/Sensory Function Overall Oral Motor/Sensory Function: Within functional limits   Ice Chips Ice chips: Within functional limits Presentation: Spoon   Thin Liquid Thin Liquid: Within functional limits Presentation: Cup;Self Fed;Straw     Nectar Thick Nectar Thick Liquid: Not tested   Honey Thick Honey Thick Liquid: Not tested   Puree Puree: Within functional limits Presentation: Spoon   Solid     Solid: Within functional limits Presentation: [place] I. Hardin Negus, [place], [place] Office number [phone] Pager [place] 03/03/2021,9:35 AM

## 2021-03-03 NOTE — ED Notes (Signed)
Pure wick placed.

## 2021-03-03 NOTE — ED Notes (Signed)
t 98.7

## 2021-03-04 ENCOUNTER — Observation Stay (HOSPITAL_BASED_OUTPATIENT_CLINIC_OR_DEPARTMENT_OTHER): Payer: Medicare Other

## 2021-03-04 ENCOUNTER — Observation Stay (HOSPITAL_COMMUNITY): Payer: Medicare Other

## 2021-03-04 DIAGNOSIS — I5032 Chronic diastolic (congestive) heart failure: Secondary | ICD-10-CM | POA: Diagnosis not present

## 2021-03-04 DIAGNOSIS — R42 Dizziness and giddiness: Secondary | ICD-10-CM

## 2021-03-04 DIAGNOSIS — H8190 Unspecified disorder of vestibular function, unspecified ear: Secondary | ICD-10-CM | POA: Diagnosis not present

## 2021-03-04 DIAGNOSIS — Z720 Tobacco use: Secondary | ICD-10-CM

## 2021-03-04 DIAGNOSIS — G459 Transient cerebral ischemic attack, unspecified: Secondary | ICD-10-CM | POA: Diagnosis not present

## 2021-03-04 DIAGNOSIS — E118 Type 2 diabetes mellitus with unspecified complications: Secondary | ICD-10-CM

## 2021-03-04 LAB — BASIC METABOLIC PANEL
Anion gap: 7 (ref 5–15)
BUN: 15 mg/dL (ref 8–23)
CO2: 27 mmol/L (ref 22–32)
Calcium: 9 mg/dL (ref 8.9–10.3)
Chloride: 105 mmol/L (ref 98–111)
Creatinine, Ser: 1.15 mg/dL — ABNORMAL HIGH (ref 0.44–1.00)
GFR, Estimated: 54 mL/min — ABNORMAL LOW (ref >60)
Glucose, Bld: 136 mg/dL — ABNORMAL HIGH (ref 70–99)
Potassium: 4.4 mmol/L (ref 3.5–5.1)
Sodium: 139 mmol/L (ref 135–145)

## 2021-03-04 LAB — HEMOGLOBIN A1C
Hgb A1c MFr Bld: 8 % — ABNORMAL HIGH (ref 4.8–5.6)
Mean Plasma Glucose: 182.9 mg/dL

## 2021-03-04 LAB — ECHOCARDIOGRAM COMPLETE
AR max vel: 2.64 cm2
AV Area VTI: 2.42 cm2
AV Area mean vel: 2.52 cm2
AV Mean grad: 5 mmHg
AV Peak grad: 10.5 mmHg
Ao pk vel: 1.62 m/s
Area-P 1/2: 2.76 cm2
Height: 67 in
MV VTI: 2.25 cm2
S' Lateral: 2.4 cm
Weight: 4160 oz

## 2021-03-04 LAB — CBC
HCT: 37.9 % (ref 36.0–46.0)
Hemoglobin: 12.2 g/dL (ref 12.0–15.0)
MCH: 31 pg (ref 26.0–34.0)
MCHC: 32.2 g/dL (ref 30.0–36.0)
MCV: 96.2 fL (ref 80.0–100.0)
Platelets: 121 10*3/uL — ABNORMAL LOW (ref 150–400)
RBC: 3.94 MIL/uL (ref 3.87–5.11)
RDW: 14.4 % (ref 11.5–15.5)
WBC: 6.8 10*3/uL (ref 4.0–10.5)
nRBC: 0 % (ref 0.0–0.2)

## 2021-03-04 LAB — GLUCOSE, CAPILLARY
Glucose-Capillary: 131 mg/dL — ABNORMAL HIGH (ref 70–99)
Glucose-Capillary: 156 mg/dL — ABNORMAL HIGH (ref 70–99)
Glucose-Capillary: 188 mg/dL — ABNORMAL HIGH (ref 70–99)
Glucose-Capillary: 222 mg/dL — ABNORMAL HIGH (ref 70–99)
Glucose-Capillary: 227 mg/dL — ABNORMAL HIGH (ref 70–99)
Glucose-Capillary: 290 mg/dL — ABNORMAL HIGH (ref 70–99)
Glucose-Capillary: 91 mg/dL (ref 70–99)

## 2021-03-04 LAB — LIPID PANEL
Cholesterol: 107 mg/dL (ref 0–200)
HDL: 36 mg/dL — ABNORMAL LOW (ref >40)
LDL Cholesterol: 43 mg/dL (ref 0–99)
Total CHOL/HDL Ratio: 3 RATIO
Triglycerides: 141 mg/dL (ref <150)
VLDL: 28 mg/dL (ref 0–40)

## 2021-03-04 LAB — PATHOLOGIST SMEAR REVIEW

## 2021-03-04 MED ORDER — TOPIRAMATE 25 MG PO TABS
50.0000 mg | ORAL_TABLET | Freq: Two times a day (BID) | ORAL | Status: DC
  Administered 2021-03-04 – 2021-03-06 (×4): 50 mg via ORAL
  Filled 2021-03-04 (×5): qty 2

## 2021-03-04 MED ORDER — PERFLUTREN LIPID MICROSPHERE
1.0000 mL | INTRAVENOUS | Status: AC | PRN
  Administered 2021-03-04: 1 mL via INTRAVENOUS
  Filled 2021-03-04: qty 10

## 2021-03-04 MED ORDER — GADOBUTROL 1 MMOL/ML IV SOLN
10.0000 mL | Freq: Once | INTRAVENOUS | Status: AC | PRN
  Administered 2021-03-04: 10 mL via INTRAVENOUS

## 2021-03-04 MED ORDER — LACTATED RINGERS IV SOLN: INTRAVENOUS | Status: DC

## 2021-03-04 NOTE — Progress Notes (Signed)
Physical Therapy Treatment/ Vestibular Assessment Patient Details Name: Jennifer [name] MRN: [id number] DOB: 27-May-1961 Today's Date: 03/04/2021    History of Present Illness Pt is a 60 y.o. F admitted 3/12 with 1 week history of dizziness and nausea. MRI negative. Significant PMH: DM2, PVD s/p L BKA, DVT, CHF, COPD, HTN.    PT Comments    Focus of session today was vestibular testing due to reports of continued dizziness. Education provided on vestibular system and pt adamant that her dizziness is NOT related to her vestibular system, however when she describes her symptoms, it sounds as if BPPV is a possibility. Pt eventually agreeable to testing. Horizontal canal mildly positive on the L which almost complete resolution of symptoms in ~30". We were not able to progress to testing posterior canals as session ultimately cut short as RN and transport presented to take pt back to MRI. Will complete vestibular testing next session.   Vestibular Assessment - 03/04/21 1046      Vestibular Assessment   General Observation Disconjugate gaze      Symptom Behavior   Subjective history of current problem Has been going on for days when she moves her head. Adamant it is not vertigo, but states the room is spinning.    Type of Dizziness  Spinning    Frequency of Dizziness Several times a day    Duration of Dizziness 30 seconds to minutes over a period of days    Symptom Nature Positional    Aggravating Factors Turning head sideways    Relieving Factors Head stationary;Closing eyes    Progression of Symptoms Better   This date   History of similar episodes Pt reports she has had vertigo before and "this is not vertigo"      Oculomotor Exam   Oculomotor Alignment Abnormal      Positional Testing   Horizontal Canal Testing Horizontal Canal Right;Horizontal Canal Left;Horizontal Canal Right Intensity;Horizontal Canal Left Intensity      Horizontal Canal Right   Horizontal Canal Right Duration pt  reports residual dizziness from testing L side that did not improve with time.    Horizontal Canal Right Symptoms Normal      Horizontal Canal Left   Horizontal Canal Left Duration 30 seconds    Horizontal Canal Left Symptoms Nystagmus      Horizontal Canal Right Intensity   Horizontal Canal Right Intensity Mild    Right Intensity Comment pt reports residual dizziness from testing L side that did not improve with time.      Horizontal Canal Left Intensity   Horizontal Canal Left Intensity Mild    Left Intensity Comment Able to report dizziness that improved with time but did not completely go away.             Follow Up Recommendations  Home health PT;Supervision for mobility/OOB     Equipment Recommendations  None recommended by PT    Recommendations for Other Services       Precautions / Restrictions Precautions Precautions: Fall Precaution Comments: L prosthetic in room Restrictions Weight Bearing Restrictions: No    Mobility  Bed Mobility Overal bed mobility: Needs Assistance Bed Mobility: Rolling;Supine to Sit Rolling: Supervision   Supine to sit: Supervision     General bed mobility comments: Pt was able to reposition in the bed for vestibular testing with supervision for safety. Pt sat up in long sitting however could not maintain upright posture due to pain in back from MRI table.  Transfers                 General transfer comment: Deferred  Ambulation/Gait                 Stairs             Wheelchair Mobility    Modified Rankin (Stroke Patients Only)       Balance Overall balance assessment: Needs assistance Sitting-balance support: Feet supported Sitting balance-Leahy Scale: Fair Sitting balance - Comments: Long sitting in bed. Pt with some difficulty due to pain. Pt states her back is hurting from positioning at MRI. Postural control: Posterior lean                                  Cognition  Arousal/Alertness: Awake/alert Behavior During Therapy: WFL for tasks assessed/performed (Argumentative and aggressive at times but not agitated) Overall Cognitive Status: Within Functional Limits for tasks assessed                                        Exercises      General Comments        Pertinent Vitals/Pain Pain Assessment: Faces Faces Pain Scale: Hurts a little bit Pain Location: L sided headache Pain Descriptors / Indicators: Pressure Pain Intervention(s): Monitored during session    Home Living                      Prior Function            PT Goals (current goals can now be found in the care plan section) Acute Rehab PT Goals Patient Stated Goal: less dizziness PT Goal Formulation: With patient Time For Goal Achievement: 03/17/21 Potential to Achieve Goals: Good Progress towards PT goals: Progressing toward goals    Frequency    Min 3X/week      PT Plan Current plan remains appropriate    Co-evaluation              AM-PAC PT "6 Clicks" Mobility   Outcome Measure  Help needed turning from your back to your side while in a flat bed without using bedrails?: None Help needed moving from lying on your back to sitting on the side of a flat bed without using bedrails?: None Help needed moving to and from a bed to a chair (including a wheelchair)?: A Little Help needed standing up from a chair using your arms (e.g., wheelchair or bedside chair)?: A Little Help needed to walk in hospital room?: A Little Help needed climbing 3-5 steps with a railing? : A Lot 6 Click Score: 19    End of Session   Activity Tolerance: Patient tolerated treatment well Patient left: in bed;with nursing/sitter in room Nurse Communication: Mobility status PT Visit Diagnosis: Dizziness and giddiness (R42)     Time: 1030-1045 PT Time Calculation (min) (ACUTE ONLY): 15 min  Charges:  $Therapeutic Activity: 8-22 mins                      Rolinda Roan, PT, DPT Acute Rehabilitation Services Pager: [phone] Office: [phone]    Thelma Comp 03/04/2021, 11:09 AM

## 2021-03-04 NOTE — Progress Notes (Signed)
OT Cancellation Note  Patient Details Name: Jennifer [name] MRN: [id number] DOB: 1961-03-29   Cancelled Treatment:    Reason Eval/Treat Not Completed: Patient at procedure or test/ unavailable off unit at MRI. Will follow and see as able.   Jolaine Artist, OT Acute Rehabilitation Services Pager [phone] Office [phone]   Delight Stare 03/04/2021, 11:57 AM

## 2021-03-04 NOTE — Progress Notes (Signed)
Subjective:   Ms. Attridge is resting in bed comfortably.  Ms. Tutor states she continues to have a left-sided headache this morning. Describes it as a pressure worst around her left eye. Feels that the entire left side of her face feels numb. She also notes she's had dental pain in her upper and lower jaw intermittently although denies any painful areas or lesions in her mouth. She continues to experience intermittent vertigo that has slightly improved with phenergan. Her dizziness tends to come on randomly and is not particularly associated with her headache (or her chronic hearing loss or tinnitus). Denies change in vision. Notes she has fallen asleep for a couple minutes at a time at random in the middle of conversations and has struggled with dropping things from her left hand over the past couple months. Notes urinary frequency despite not taking her fluid pill at home (has not taken it since prior to symptom onset).   Objective:  Vital signs in last 24 hours: Vitals:   03/03/21 1928 03/04/21 0027 03/04/21 0431 03/04/21 0851  BP: (!) 116/58 (!) 126/48 (!) 107/48 120/80  Pulse: 75 70 68 72  Resp: 19 18 18 18   Temp: 97.7 F (36.5 C) 98.2 F (36.8 C) 97.9 F (36.6 C) 97.9 F (36.6 C)  TempSrc: Oral Oral Oral Oral  SpO2: 96% 90% 94% 97%  Weight:      Height:       General: Patient appears well. No acute distress. Eyes: Full EOMI bilaterally. Sclera non-icteric. No conjunctival injection. No tearing or drainage.   HENT: Neck is supple. No cervical lymphadenopathy or nasal drainage. Cardiovascular: Regular rate and rhythm. No murmurs, rubs, or gallops. There is non-pitting edema of the RLE with chronic venous stasis changes.  Neurological: There is diminished sensation of the V2/V3 segments of CN V. Otherwise, CN II-XII intact. Strength is 4+/5 in bilateral upper extremities. Able to move bilateral lower extremities.   Assessment/Plan:  Principal Problem:   Acute vestibular  syndrome Active Problems:   Diabetes mellitus type 2, controlled, with complications (HCC)   Abnormality of gait and recurrent falls   Severe obesity (BMI >= 40) (HCC)   Chronic diastolic heart failure (HCC)   Tobacco abuse  # Acute Vestibular Syndrome   Ms. Shukla continues to have intermittent vertiginous symptoms that occur intermittently, without identifiable trigger. Etiology unclear, although includes MS, ischemic disease, complex migraine, or Meniere's disease vs. Other cranial neuropathy such as trigeminal neuralgia. MRI brain did show numerous bilateral supratentorial white matter lesions concerning for possible demyelinating disease. Neurology following, recommending workup for TIA. MRA head without contrast shows mild right cavernous ICA stenosis although was otherwise unremarkable. Spoke with Dr. Carlis Abbott from radiology, who notes new midbrain and pons lesions since previous MR < 1 year ago. Recommends MR brain with contrast. Appreciate their assistance and recommendations.   - Check carotid ultrasound  - ECHO pending - Check MR brain with contrast to assess for enhancement of new lesions to suggest MS - Continue Phenergan 25mg  q6 hours PRN   # Thrombocytopenia  Ongoing for months. Reticulocyte count is low.  - pathologist smear pending   # CKD stage 3a  Creatinine slightly elevated today, although appears to be around patient's baseline. Likely prerenal component in setting of N/V.  - Start LR @ 156mL/hr  - Continue to trend renal function   Prior to Admission Living Arrangement: Home Anticipated Discharge Location: Home with [facility] Barriers to Discharge: Further medical workup   Jeralyn Bennett,  MD 03/04/2021, 10:52 AM Pager: [phone] After 5pm on weekdays and 1pm on weekends: On Call pager [phone]

## 2021-03-04 NOTE — Evaluation (Signed)
Occupational Therapy Evaluation Patient Details Name: Jennifer [name] MRN: [id number] DOB: 04-02-1961 Today's Date: 03/04/2021    History of Present Illness Pt is a 60 y.o. F admitted 3/12 with 1 week history of dizziness and nausea. MRI negative. Significant PMH: DM2, PVD s/p L BKA, DVT, CHF, COPD, HTN.   Clinical Impression   Patient supine in bed and agreeable to OT evaluation. Patient reports independent with ADLs, mobility using rollator/prosthetic to L LE; uses wheelchair in community.  Admitted for above and limited by problem list below, including decreased activity tolerance and balance.  NO dizziness during OT session today, but reports it comes and goes- when she is having dizziness, she notices L sided weakness as well--this is not seen during OT eval today.  Patient able to don L prosthetic after setup, transfer using RW and side step to [facility] with min guard assist, complete ADLs with up to min guard assist for safety.  She will benefit from further OT services acutely to optimize independence and safety, but anticipate no further needs after dc (as long as symptoms of dizziness do not return).  Will follow and update recommendations as needed.      Follow Up Recommendations  No OT follow up;Supervision - Intermittent    Equipment Recommendations  3 in 1 bedside commode    Recommendations for Other Services       Precautions / Restrictions Precautions Precautions: Fall Precaution Comments: L prosthetic in room Restrictions Weight Bearing Restrictions: No      Mobility Bed Mobility Overal bed mobility: Needs Assistance Bed Mobility: Supine to Sit;Sit to Supine Rolling: Supervision   Supine to sit: Supervision     General bed mobility comments: no assist required, supervision for safety    Transfers Overall transfer level: Needs assistance Equipment used: Rolling walker (2 wheeled) Transfers: Sit to/from Stand Sit to Stand: Min guard         General transfer  comment: min guard using RW to steady in standing after donning prosthetic    Balance Overall balance assessment: Needs assistance Sitting-balance support: No upper extremity supported;Feet supported Sitting balance-Leahy Scale: Good     Standing balance support: Bilateral upper extremity supported;During functional activity Standing balance-Leahy Scale: Poor Standing balance comment: relies on BUE support in standing                           ADL either performed or assessed with clinical judgement   ADL Overall ADL's : Needs assistance/impaired Eating/Feeding: Independent   Grooming: Set up;Sitting   Upper Body Bathing: Set up;Sitting   Lower Body Bathing: Min guard;Sit to/from stand   Upper Body Dressing : Set up;Sitting   Lower Body Dressing: Min guard;Sit to/from stand   Toilet Transfer: Designer, television/film set Details (indicate cue type and reason): simulated side stepping towards HOB         Functional mobility during ADLs: Min guard;Rolling walker;Cueing for safety General ADL Comments: pt presenting with no dizziness this afternoon, min guard throughout for safety     Vision Baseline Vision/History: Wears glasses (L lazy eye, dysconjugate gaze) Wears Glasses: At all times Patient Visual Report: Other (comment);No change from baseline (L lazy eye) Vision Assessment?: No apparent visual deficits Additional Comments: pt with baseline L lazy eye, dysconjugate gaze     Perception     Praxis      Pertinent Vitals/Pain Pain Assessment: No/denies pain     Hand Dominance Right   Extremity/Trunk  Assessment Upper Extremity Assessment Upper Extremity Assessment: Generalized weakness RUE Deficits / Details: grossly 4/5, WFL LUE Deficits / Details: grossly 4/5, WFL   Lower Extremity Assessment Lower Extremity Assessment: Defer to PT evaluation   Cervical / Trunk Assessment Cervical / Trunk Assessment: Other exceptions Cervical / Trunk  Exceptions: increased body habitus   Communication Communication Communication: No difficulties   Cognition Arousal/Alertness: Awake/alert Behavior During Therapy: WFL for tasks assessed/performed Overall Cognitive Status: Within Functional Limits for tasks assessed                                     General Comments  pt donned prosthetic with independence    Exercises     Shoulder Instructions      Home Living Family/patient expects to be discharged to:: Private residence Living Arrangements: Children;Other relatives (sons, sister, sisters husband) Available Help at Discharge: Family;Available 24 hours/day Type of Home: House Home Access: Stairs to enter [employer] of Steps: 4-5 Entrance Stairs-Rails: Left Home Layout: One level     Bathroom Shower/Tub: Tub/shower unit;Other (comment)   Bathroom Toilet: Handicapped height     Home Equipment: Bedside commode;Tub bench;Shower seat;Wheelchair - [employer] - 4 wheels;Walker - 2 wheels   Additional Comments: mostly uses rollator; walks inside the house with rollator and uses WC outside of house.      Prior Functioning/Environment Level of Independence: Needs assistance  Gait / Transfers Assistance Needed: Rollator ADL's / Homemaking Assistance Needed: independent   Comments: wears prosthetic throughout home, ind IADLs        OT Problem List: Decreased strength;Decreased activity tolerance;Obesity      OT Treatment/Interventions: Self-care/ADL training;Therapeutic exercise;DME and/or AE instruction;Therapeutic activities;Patient/family education;Balance training    OT Goals(Current goals can be found in the care plan section) Acute Rehab OT Goals Patient Stated Goal: for the dizziness to not come back OT Goal Formulation: With patient Time For Goal Achievement: 03/18/21 Potential to Achieve Goals: Good  OT Frequency: Min 2X/week   Barriers to D/C:            Co-evaluation               AM-PAC OT "6 Clicks" Daily Activity     Outcome Measure Help from another person eating meals?: None Help from another person taking care of personal grooming?: A Little Help from another person toileting, which includes using toliet, bedpan, or urinal?: A Little Help from another person bathing (including washing, rinsing, drying)?: A Little Help from another person to put on and taking off regular upper body clothing?: A Little Help from another person to put on and taking off regular lower body clothing?: A Little 6 Click Score: 19   End of Session Equipment Utilized During Treatment: Rolling walker;Other (comment) (L prosthetic) Nurse Communication: Mobility status  Activity Tolerance: Patient tolerated treatment well Patient left: in bed;with call bell/phone within reach;with bed alarm set;with nursing/sitter in room  OT Visit Diagnosis: Other abnormalities of gait and mobility (R26.89);Dizziness and giddiness (R42);Other symptoms and signs involving the nervous system (R29.898)                Time: 0737-1062 OT Time Calculation (min): 35 min Charges:  OT General Charges $OT Visit: 1 Visit OT Evaluation $OT Eval Moderate Complexity: 1 Mod OT Treatments $Self Care/Home Management : 8-22 mins  Jolaine Artist, OT Acute Rehabilitation Services Pager [phone] Office [phone]   Jennifer [name] 03/04/2021,  4:15 PM

## 2021-03-04 NOTE — Progress Notes (Signed)
Carotid duplex bilateral study completed.   Please see CV Proc for preliminary results.   Braxton Weisbecker, RDMS, RVT  

## 2021-03-04 NOTE — Progress Notes (Signed)
Carotid duplex bilateral attempted x2. Patient unavailable. Will attempt again as schedule permits.   Darlin Coco, RDMS, RVT

## 2021-03-04 NOTE — Progress Notes (Addendum)
STROKE TEAM PROGRESS NOTE   INTERVAL HISTORY She reports that her symptoms started approximately 2 weeks ago. She began having headaches on and off. When she had the headaches she would have dizziness. She reports no double vision but she does reports chronic fatigue. She reports she is currently seeing a urologist because she has become incontinence and bladder urgency with a few accidents recently.    On chart review per Dr. [name] 04/12/2020- "She also complains of a tremor and has mentioned this in the past.  It is only when she is trying to intentionally move her hand and point, like dialing a telephone number or pressing a button on a phone.  It never occurs at rest.  She is worried because her mother died of Parkinson's disease.  She continues to complain of memory issues.  Recently, she could not write 5 because she could not remember how.  This lasted a day or 2.  It is not occurring as often as it has been.  She is worried because her mom also had dementia.  Assessment: Constellation of stuttering, intermittent visual hallucinations, intermittent memory issues, and intentional tremor.  Stuttering is not a feature of Lewy body dementia or Parkinson's disease.  Her tremor is not consistent with either diagnosis either.  Her stuttering may have a somatic component to it.  Her MRI showed no etiology for any of her symptoms."  Internal Medicine Teaching Service is Primary  Vitals:   03/04/21 0027 03/04/21 0431 03/04/21 0851 03/04/21 1213  BP: (!) 126/48 (!) 107/48 120/80 (!) 136/57  Pulse: 70 68 72 72  Resp: 18 18 18 20   Temp: 98.2 F (36.8 C) 97.9 F (36.6 C) 97.9 F (36.6 C) 97.7 F (36.5 C)  TempSrc: Oral Oral Oral Oral  SpO2: 90% 94% 97% 100%  Weight:      Height:       CBC:  Recent Labs  Lab 03/02/21 1749 03/03/21 0636 03/04/21 0233  WBC 7.2 6.5 6.8  NEUTROABS 3.3  --   --   HGB 13.4 12.9 12.2  HCT 40.5 39.1 37.9  MCV 96.7 95.1 96.2  PLT 128* 123* 121*   Basic  Metabolic Panel:  Recent Labs  Lab 03/03/21 0636 03/04/21 0233  NA 141 139  K 4.2 4.4  CL 108 105  CO2 27 27  GLUCOSE 91 136*  BUN 14 15  CREATININE 0.93 1.15*  CALCIUM 9.3 9.0   Lipid Panel:  Recent Labs  Lab 03/04/21 1205  CHOL 107  TRIG 141  HDL 36*  CHOLHDL 3.0  VLDL 28  LDLCALC 43   HgbA1c:  Recent Labs  Lab 03/04/21 1205  HGBA1C 8.0*   Urine Drug Screen: No results for input(s): LABOPIA, COCAINSCRNUR, LABBENZ, AMPHETMU, THCU, LABBARB in the last 168 hours.  Alcohol Level No results for input(s): ETH in the last 168 hours.  IMAGING past 24 hours MR ANGIO HEAD WO CONTRAST  Result Date: 03/04/2021 CLINICAL DATA:  Dizziness and headaches.  Left arm weakness. EXAM: MRA HEAD WITHOUT CONTRAST TECHNIQUE: Angiographic images of the Circle of Willis were obtained using MRA technique without intravenous contrast. COMPARISON:  None. FINDINGS: The visualized distal vertebral arteries are widely patent to the basilar with the left being mildly dominant. Patent PICAs, AICAs, and SCAs are seen bilaterally. The basilar artery is widely patent. There is a moderate-sized right posterior communicating artery. Both PCAs are patent without evidence of a significant proximal stenosis. The internal carotid arteries are patent from skull base  to carotid termini with mild right cavernous stenosis. ACAs and MCAs are patent without evidence of a proximal branch occlusion or significant proximal stenosis. No aneurysm is identified. IMPRESSION: 1. Mild right cavernous ICA stenosis. 2. Otherwise unremarkable head MRA. Electronically Signed   By: Logan Bores M.D.   On: 03/04/2021 08:14   MR BRAIN W CONTRAST  Result Date: 03/04/2021 CLINICAL DATA:  Numbness or tingling, paresthesia EXAM: MRI HEAD WITH CONTRAST TECHNIQUE: Multiplanar, multiecho pulse sequences of the brain and surrounding structures were obtained with intravenous contrast. CONTRAST:  74mL GADAVIST GADOBUTROL 1 MMOL/ML IV SOLN  COMPARISON:  Prior brain MRI examinations 03/02/2021 and earlier. MRA head 03/04/2021. FINDINGS: Brain: A limited contrast-enhanced brain MRI was performed as a contrast-enhanced follow-up to the recent noncontrast MRI of 03/02/2021. The current examination consists of an axial precontrast T1 weighted sequence, a coronal T2 weighted sequence, as well as axial and coronal postcontrast T1 weighted sequences. Redemonstration of numerous T2 hyperintense supratentorial white matter lesions bilaterally, incompletely assessed on this limited protocol brain MRI. No pathologic intracranial enhancement is demonstrated. Vascular: Expected enhancement of the proximal large arterial vessels and dural venous sinuses. Skull and upper cervical spine: No suspicious enhancing marrow lesion. Sinuses/Orbits: No pathologic enhancement within the orbits. Minimal ethmoid sinus mucosal thickening bilaterally. Other: As before, there are multiple bilateral parotid nodules the largest in the right gland measuring 11 mm. IMPRESSION: Mildly motion degraded exam. No pathologic intracranial enhancement. Electronically Signed   By: Kellie Simmering DO   On: 03/04/2021 11:58   MR CERVICAL SPINE WO CONTRAST  Result Date: 03/03/2021 CLINICAL DATA:  Cervical radiculopathy EXAM: MRI CERVICAL SPINE WITHOUT CONTRAST TECHNIQUE: Multiplanar, multisequence MR imaging of the cervical spine was performed. No intravenous contrast was administered. COMPARISON:  04/06/2019 FINDINGS: Alignment: Normal Vertebrae: No fracture, evidence of discitis, or bone lesion. Cord: Normal signal and morphology. Posterior Fossa, vertebral arteries, paraspinal tissues: Negative Disc levels: C2-3: Unchanged left foraminal disc protrusion with uncovertebral spurring causing mild left foraminal stenosis. Unchanged. C3-4: Mild uncovertebral hypertrophy without spinal canal or neural foraminal stenosis. Unchanged. C4-5: Small disc bulge without stenosis. C5-6: Normal. C6-7: Normal.  C7-T1: Normal. T1-2: Normal. T2-3: Normal. IMPRESSION: 1. Unchanged left foraminal disc protrusion with uncovertebral spurring at C2-3 causing mild left foraminal stenosis. 2. No cervical spinal canal or neural foraminal stenosis. Electronically Signed   By: Ulyses Jarred M.D.   On: 03/03/2021 19:20   ECHOCARDIOGRAM COMPLETE  Result Date: 03/04/2021    ECHOCARDIOGRAM REPORT   Patient Name:   Jennifer [name] Date of Exam: 03/04/2021 Medical Rec #:  [id number]       Height:       67.0 in Accession #:    [id number]      Weight:       260.0 lb Date of Birth:  December 28, 1960       BSA:          2.261 m Patient Age:    67 years        BP:           107/48 mmHg Patient Gender: F               HR:           75 bpm. Exam Location:  Inpatient Procedure: 2D Echo, 3D Echo, Cardiac Doppler, Color Doppler, Strain Analysis and            Intracardiac Opacification Agent Indications:    TIA  History:  Patient has prior history of Echocardiogram examinations, most                 recent 10/17/2019. CHF; Risk Factors:Diabetes, Dyslipidemia and                 Hypertension.  Sonographer:    [place] Referring Phys: [id number] [facility]  Sonographer Comments: Technically difficult study due to poor echo windows and patient is morbidly obese. IMPRESSIONS  1. Left ventricular ejection fraction, by estimation, is 60 to 65%. The left ventricle has normal function. The left ventricle has no regional wall motion abnormalities. Left ventricular diastolic parameters are consistent with Grade I diastolic dysfunction (impaired relaxation).  2. Right ventricular systolic function is normal. The right ventricular size is normal.  3. Right atrial size was mildly dilated.  4. The mitral valve is normal in structure. No evidence of mitral valve regurgitation.  5. The aortic valve is normal in structure. Aortic valve regurgitation is not visualized. FINDINGS  Left Ventricle: Left ventricular ejection fraction, by estimation, is  60 to 65%. The left ventricle has normal function. The left ventricle has no regional wall motion abnormalities. Definity contrast agent was given IV to delineate the left ventricular  endocardial borders. The left ventricular internal cavity size was normal in size. There is no left ventricular hypertrophy. Left ventricular diastolic parameters are consistent with Grade I diastolic dysfunction (impaired relaxation). Right Ventricle: The right ventricular size is normal. No increase in right ventricular wall thickness. Right ventricular systolic function is normal. Left Atrium: Left atrial size was normal in size. Right Atrium: Right atrial size was mildly dilated. Pericardium: There is no evidence of pericardial effusion. Mitral Valve: The mitral valve is normal in structure. No evidence of mitral valve regurgitation. MV peak gradient, 5.6 mmHg. The mean mitral valve gradient is 3.0 mmHg. Tricuspid Valve: The tricuspid valve is normal in structure. Tricuspid valve regurgitation is trivial. Aortic Valve: The aortic valve is normal in structure. Aortic valve regurgitation is not visualized. Aortic valve mean gradient measures 5.0 mmHg. Aortic valve peak gradient measures 10.5 mmHg. Aortic valve area, by VTI measures 2.42 cm. Pulmonic Valve: The pulmonic valve was normal in structure. Pulmonic valve regurgitation is not visualized. Aorta: The aortic root and ascending aorta are structurally normal, with no evidence of dilitation. IAS/Shunts: The atrial septum is grossly normal.  LEFT VENTRICLE PLAX 2D LVIDd:         5.10 cm  Diastology LVIDs:         2.40 cm  LV e' medial:    5.22 cm/s LV PW:         1.10 cm  LV E/e' medial:  19.7 LV IVS:        1.10 cm  LV e' lateral:   10.00 cm/s LVOT diam:     2.00 cm  LV E/e' lateral: 10.3 LV SV:         79 LV SV Index:   35       2D Longitudinal Strain LVOT Area:     3.14 cm 2D Strain GLS Avg:     -15.9 %  RIGHT VENTRICLE RV S prime:     12.90 cm/s TAPSE (M-mode): 2.6 cm LEFT  ATRIUM             Index       RIGHT ATRIUM           Index LA Vol (A2C):   38.4 ml 16.99 ml/m RA Area:  19.70 cm LA Vol (A4C):   59.6 ml 26.36 ml/m RA Volume:   63.10 ml  27.91 ml/m LA Biplane Vol: 50.2 ml 22.20 ml/m  AORTIC VALVE                    PULMONIC VALVE AV Area (Vmax):    2.64 cm     PV Vmax:       0.72 m/s AV Area (Vmean):   2.52 cm     PV Vmean:      48.300 cm/s AV Area (VTI):     2.42 cm     PV VTI:        0.135 m AV Vmax:           162.00 cm/s  PV Peak grad:  2.1 mmHg AV Vmean:          107.000 cm/s PV Mean grad:  1.0 mmHg AV VTI:            0.329 m AV Peak Grad:      10.5 mmHg AV Mean Grad:      5.0 mmHg LVOT Vmax:         136.00 cm/s LVOT Vmean:        85.700 cm/s LVOT VTI:          0.253 m LVOT/AV VTI ratio: 0.77  AORTA Ao Root diam: 3.00 cm Ao Asc diam:  2.70 cm MITRAL VALVE MV Area (PHT): 2.76 cm     SHUNTS MV Area VTI:   2.25 cm     Systemic VTI:  0.25 m MV Peak grad:  5.6 mmHg     Systemic Diam: 2.00 cm MV Mean grad:  3.0 mmHg MV Vmax:       1.18 m/s MV Vmean:      74.7 cm/s MV Decel Time: 275 msec MV E velocity: 103.00 cm/s MV A velocity: 126.00 cm/s MV E/A ratio:  0.82 Mertie Moores MD Electronically signed by Mertie Moores MD Signature Date/Time: 03/04/2021/12:51:11 PM    Final     PHYSICAL EXAM Blood pressure (!) 136/57, pulse 72, temperature 97.7 F (36.5 C), temperature source Oral, resp. rate 20, height 5\' 7"  (1.702 m), weight 117.9 kg, SpO2 100 %.  General: alert and awake, morbidly obese african [place] female, no apparent distress.  Left leg below-knee amputation  Lungs: Symmetrical Chest rise, no labored breathing  Cardio: Regular Rate and Rhythm  Abdomen: Soft, non-tender  Neuro: Alert, oriented, thought content appropriate.  Speech fluent without evidence of aphasia.  Able to follow all commands without difficulty.  No dysarthria. Cranial Nerves: II:  Visual fields grossly normal, pupils equal, round, reactive to light and accommodation, Left eye  exotropia present but full range of eye movements without nystagmus or any saccadic dysmetria III,IV, VI: ptosis not present, extra-ocular motions intact bilaterally V,VII: smile symmetric, facial light touch sensation diminished in V1 distribution on the Left otherwise normal bilaterally  VIII: hearing normal bilaterally IX,X: uvula rises symmetrically XI: bilateral shoulder shrug XII: midline tongue extension without atrophy or fasciculations  Motor: Right : Upper extremity   4/5    Left:     Upper extremity   4/5  Lower extremity   4/5     Lower extremity   4/5 at the left hip and cannot test: Foot due to amputation Tone and bulk:normal tone throughout; no atrophy noted Sensory: Sensation diminished on Left Upper and Lower extremity, no sensation in Right foot due to Neuropathy Cerebellar: normal finger-to-nose  Gait: deferred    ASSESSMENT/PLAN Jennifer [name] is a 60 y.o. female with history of type II diabetes mellitus, peripheral vascular disease s/p L BKA 2014, hx DVT 3159, chronic diastolic heart failure, COPD, hypertension who presented to the ED for a 1 wk history of dizziness and nausea. Patient states she was in her usual state of health until last Wednesday when she began feeling dizzy. Describes it as "feeling as the room is spinning." States this lasts for hours-days and is not related to position, no obvious triggering event. Denies syncope or falls. This is associated with nausea, non-bloody vomiting, and headaches. she mentions her son had acute gastrointestinal illness last weekend which included symptoms of diarrhea and vomiting. She is vaccinated x 3 for COVID-19 and believes she received the influenza vaccine this year. Describes the headaches as left-sided, including some of her face but doesn't radiate past her left chin. She denies lacrimation to the left side, denies photophobia involving left eye. She describes it as a constant pain involving the entire left side of  her head, started posteriorly and now affects the entire left side. She complains of weakness to her left arm which is upsetting to her because it prevents her from "holding a cigarette."   Her imagining does not show any acute stroke. Given severity of headaches will start Topamax. Awaiting Vas US Carotid. Will consult further with Neurology Hospitalist service given her complex presentation.  No findings of Stroke, MRI showing extensive white matter changes possibly due to small vessel disease versus multiple sclerosis who she has no prior history with history of  CT head No acute intracranial findings.   MRI  W Contrast - Mildly motion degraded exam. No pathologic intracranial enhancement.  MRA - Mild right cavernous ICA stenosis. Otherwise unremarkable head MRA.  2D Echo - EF: 60-65%. No regional wall abnormalities.  LDL 43  HgbA1c 8.0  VTE prophylaxis - Lovenox    Diet   Diet Heart Room service appropriate? Yes; Fluid consistency: Thin     aspirin 81 mg daily prior to admission, now on aspirin 81 mg daily.   Therapy recommendations:  Pending  Disposition:  Pending  Hypertension  Home meds:  Amlodipine, Benazepril, Metoprolol  Stable . Long-term BP goal normotensive  Hyperlipidemia  Home meds:  Crestor, resumed in hospital  LDL 43, goal < 70  Continue statin at discharge  Diabetes type II Uncontrolled  Home meds:  Insulin Degludec, Insulin Lispro, Ozempic, Metformin  HgbA1c 8.0, goal < 7.0  CBGs Recent Labs    03/04/21 0432 03/04/21 0851 03/04/21 1210  GLUCAP 131* 91 222*      SSI  Other Stroke Risk Factors  Cigarette smoker advised to stop smoking  Obesity, Body mass index is 40.72 kg/m., BMI >/= 30 associated with increased stroke risk, recommend weight loss, diet and exercise as appropriate   Congestive heart failure   Other Active Problems  PVD  Incontinence   COPD   Hospital day # 0  Fatima Sanger MD Resident I have  personally obtained history,examined this patient, reviewed notes, independently viewed imaging studies, participated in medical decision making and plan of care.ROS completed by me personally and pertinent positives fully documented  I have made any additions or clarifications directly to the above note. Agree with note above.  Patient has given variable history is to Dr. Cheral Marker, to the occupational therapist and to me.  Her main complaint to me is that she is dizzy and off-balance but she  denies true vertigo.  She has subjective complaints of fatigue as well as bladder urgency which have been ongoing.  MRI scan shows no acute stroke but shows stable appearance of bilateral supratentorial white matter hyperintensities which are nonspecific did not show any enhancement after postcontrast scan and could be from small vessel disease but in younger patient will suggest demyelinating disease patient has no findings to suggest that Recommend ongoing therapies.  No further stroke work-up is needed.  Follow-up as an outpatient in the neurology clinic necessary.  Greater than 50% time during the 35-minute visit was spent on counseling and coordination of care about her dizziness discussion as well Antony Contras, MD Medical Director [facility] Pager: [phone] 03/04/2021 3:56 PM   To contact Stroke Continuity provider, please refer to [email or web address] After hours, contact General Neurology

## 2021-03-04 NOTE — Progress Notes (Signed)
Attempted to see pt and she was in a procedure. Unable to arrange DME at this time of day. TOC following.

## 2021-03-05 DIAGNOSIS — I5032 Chronic diastolic (congestive) heart failure: Secondary | ICD-10-CM | POA: Diagnosis not present

## 2021-03-05 DIAGNOSIS — R42 Dizziness and giddiness: Secondary | ICD-10-CM | POA: Diagnosis not present

## 2021-03-05 DIAGNOSIS — E118 Type 2 diabetes mellitus with unspecified complications: Secondary | ICD-10-CM | POA: Diagnosis not present

## 2021-03-05 DIAGNOSIS — H8190 Unspecified disorder of vestibular function, unspecified ear: Secondary | ICD-10-CM | POA: Diagnosis not present

## 2021-03-05 LAB — BASIC METABOLIC PANEL
Anion gap: 6 (ref 5–15)
BUN: 19 mg/dL (ref 8–23)
CO2: 27 mmol/L (ref 22–32)
Calcium: 9 mg/dL (ref 8.9–10.3)
Chloride: 105 mmol/L (ref 98–111)
Creatinine, Ser: 1.49 mg/dL — ABNORMAL HIGH (ref 0.44–1.00)
GFR, Estimated: 39 mL/min — ABNORMAL LOW (ref >60)
Glucose, Bld: 197 mg/dL — ABNORMAL HIGH (ref 70–99)
Potassium: 4.6 mmol/L (ref 3.5–5.1)
Sodium: 138 mmol/L (ref 135–145)

## 2021-03-05 LAB — GLUCOSE, CAPILLARY
Glucose-Capillary: 174 mg/dL — ABNORMAL HIGH (ref 70–99)
Glucose-Capillary: 243 mg/dL — ABNORMAL HIGH (ref 70–99)
Glucose-Capillary: 240 mg/dL — ABNORMAL HIGH (ref 70–99)
Glucose-Capillary: 269 mg/dL — ABNORMAL HIGH (ref 70–99)
Glucose-Capillary: 217 mg/dL — ABNORMAL HIGH (ref 70–99)

## 2021-03-05 LAB — HIV ANTIBODY (ROUTINE TESTING W REFLEX): HIV Screen 4th Generation wRfx: NONREACTIVE

## 2021-03-05 LAB — TSH: TSH: 1.094 u[IU]/mL (ref 0.350–4.500)

## 2021-03-05 LAB — CBC
HCT: 36.6 % (ref 36.0–46.0)
Hemoglobin: 12.2 g/dL (ref 12.0–15.0)
MCH: 31.9 pg (ref 26.0–34.0)
MCHC: 33.3 g/dL (ref 30.0–36.0)
MCV: 95.6 fL (ref 80.0–100.0)
Platelets: 118 10*3/uL — ABNORMAL LOW (ref 150–400)
RBC: 3.83 MIL/uL — ABNORMAL LOW (ref 3.87–5.11)
RDW: 14.1 % (ref 11.5–15.5)
WBC: 7 10*3/uL (ref 4.0–10.5)
nRBC: 0 % (ref 0.0–0.2)

## 2021-03-05 LAB — HEPATITIS C ANTIBODY: HCV Ab: NONREACTIVE

## 2021-03-05 MED ORDER — LACTATED RINGERS IV SOLN: INTRAVENOUS | Status: AC

## 2021-03-05 MED ORDER — INSULIN ASPART 100 UNIT/ML ~~LOC~~ SOLN
0.0000 [IU] | Freq: Three times a day (TID) | SUBCUTANEOUS | Status: DC
  Administered 2021-03-05 – 2021-03-06 (×2): 11 [IU] via SUBCUTANEOUS
  Administered 2021-03-06: 4 [IU] via SUBCUTANEOUS
  Administered 2021-03-06: 7 [IU] via SUBCUTANEOUS

## 2021-03-05 MED ORDER — INSULIN ASPART 100 UNIT/ML ~~LOC~~ SOLN
0.0000 [IU] | Freq: Every day | SUBCUTANEOUS | Status: DC
  Administered 2021-03-05: 2 [IU] via SUBCUTANEOUS

## 2021-03-05 MED ORDER — INSULIN GLARGINE 100 UNIT/ML ~~LOC~~ SOLN
38.0000 [IU] | Freq: Every day | SUBCUTANEOUS | Status: DC
  Administered 2021-03-05: 38 [IU] via SUBCUTANEOUS
  Filled 2021-03-05 (×2): qty 0.38

## 2021-03-05 NOTE — Progress Notes (Signed)
Inpatient Diabetes Program Recommendations  AACE/ADA: New Consensus Statement on Inpatient Glycemic Control (2015)  Target Ranges:  Prepandial:   less than 140 mg/dL      Peak postprandial:   less than 180 mg/dL (1-2 hours)      Critically ill patients:  140 - 180 mg/dL   Lab Results  Component Value Date   GLUCAP 243 (H) 03/05/2021   HGBA1C 8.0 (H) 03/04/2021    Review of Glycemic Control Results for Jennifer [name], Jennifer [name] (MRN [id number]) as of 03/05/2021 10:53  Ref. Range 03/05/2021 03:30 03/05/2021 07:46  Glucose-Capillary Latest Ref Range: 70 - 99 mg/dL 174 (H) 243 (H)   Diabetes history: DM 2 Outpatient Diabetes medications: Tresiba 70 units Daily, Humalog 2-4 units tid, Metformin 500 mg Daily, Ozempic 0.5 mg Qmonday Current orders for Inpatient glycemic control: Lantus 35 units qhs, Novolog 0-15 units tid + hs  Inpatient Diabetes Program Recommendations:    - Consider increasing Lantus to 40 units  Thanks,  Tama Headings RN, MSN, BC-ADM Inpatient Diabetes Coordinator Team Pager [phone] (8a-5p)

## 2021-03-05 NOTE — Progress Notes (Signed)
Physical Therapy Treatment - Vestibular Patient Details Name: Jennifer [name] MRN: [id number] DOB: October 20, 1961 Today's Date: 03/05/2021    History of Present Illness Pt is a 60 y.o. F admitted 3/12 with 1 week history of dizziness and nausea. MRI negative. Significant PMH: DM2, PVD s/p L BKA, DVT, CHF, COPD, HTN.    PT Comments    Vestibular testing continued today due to session being cut short by MRI yesterday. Overall it is difficult to tell if symptoms are improved or remain unchanged. Pt reports they are unchanged, however the dizziness appears to be significantly improved compared to PTA. Pt does not seem to be limited by dizziness throughout session but when asked, pt endorses mild dizziness. She reports a feeling of ongoing dizziness throughout session, which is grossly unchanged with posterior canal testing. It was difficult to tell whether nystagmus was present as pt moving eyes during testing, however it is likely that pt had a very mild R beating nystagmus that lasted >1 minute in each position. I opted to defer any canalith repositioning treatment as there was no clear indication that one canal was the cause of her ongoing dizziness. Physical therapy will continue to follow for mobility however do not feel there is a need for further vestibular testing/treatment at this time.      Vestibular Assessment - 03/05/21 0001      Positional Testing   Dix-Hallpike Dix-Hallpike Right;Dix-Hallpike Left      Dix-Hallpike Right   Dix-Hallpike Right Duration ongoing    Dix-Hallpike Right Symptoms Right nystagmus      Dix-Hallpike Left   Dix-Hallpike Left Duration ongoing    Dix-Hallpike Left Symptoms Right nystagmus            Follow Up Recommendations  Home health PT;Supervision for mobility/OOB     Equipment Recommendations  None recommended by PT    Recommendations for Other Services       Precautions / Restrictions Precautions Precautions: Fall Precaution Comments: L  prosthetic in room Restrictions Weight Bearing Restrictions: No    Mobility  Bed Mobility Overal bed mobility: Needs Assistance Bed Mobility: Supine to Sit;Sit to Supine     Supine to sit: Supervision     General bed mobility comments: Pt was able to reposition in the bed for vestibular testing with supervision for safety. Pt sat up in long sitting however could not maintain upright posture without suport due to pain in back from MRI table.    Transfers                 General transfer comment: deferred  Ambulation/Gait                 Stairs             Wheelchair Mobility    Modified Rankin (Stroke Patients Only)       Balance Overall balance assessment: Needs assistance Sitting-balance support: No upper extremity supported;Feet supported Sitting balance-Leahy Scale: Fair Sitting balance - Comments: Long sitting in bed. Pt with some difficulty due to pain. Pt states her back is hurting from positioning at MRI.                                    Cognition Arousal/Alertness: Awake/alert Behavior During Therapy: WFL for tasks assessed/performed Overall Cognitive Status: Within Functional Limits for tasks assessed  Exercises      General Comments        Pertinent Vitals/Pain Pain Assessment: Faces Faces Pain Scale: Hurts a little bit Pain Location: L sided headache Pain Descriptors / Indicators: Pressure Pain Intervention(s): Monitored during session    Home Living                      Prior Function            PT Goals (current goals can now be found in the care plan section) Acute Rehab PT Goals Patient Stated Goal: Back to normal PT Goal Formulation: With patient Time For Goal Achievement: 03/17/21 Potential to Achieve Goals: Good Progress towards PT goals: Progressing toward goals    Frequency    Min 3X/week      PT Plan Current plan  remains appropriate    Co-evaluation              AM-PAC PT "6 Clicks" Mobility   Outcome Measure  Help needed turning from your back to your side while in a flat bed without using bedrails?: None Help needed moving from lying on your back to sitting on the side of a flat bed without using bedrails?: None Help needed moving to and from a bed to a chair (including a wheelchair)?: A Little Help needed standing up from a chair using your arms (e.g., wheelchair or bedside chair)?: A Little Help needed to walk in hospital room?: A Little Help needed climbing 3-5 steps with a railing? : A Lot 6 Click Score: 19    End of Session   Activity Tolerance: Patient tolerated treatment well Patient left: in bed;with nursing/sitter in room Nurse Communication: Mobility status PT Visit Diagnosis: Dizziness and giddiness (R42)     Time: 3888-2800 PT Time Calculation (min) (ACUTE ONLY): 20 min  Charges:  $Therapeutic Activity: 8-22 mins                     Rolinda Roan, PT, DPT Acute Rehabilitation Services Pager: [phone] Office: [phone]    Thelma Comp 03/05/2021, 1:16 PM

## 2021-03-05 NOTE — Progress Notes (Signed)
Occupational Therapy Treatment Patient Details Name: Jennifer [name] MRN: [id number] DOB: [birth year] Today's Date: 03/05/2021    History of present illness Pt is a 60 y.o. F admitted 3/12 with 1 week history of dizziness and nausea. MRI negative. Significant PMH: DM2, PVD s/p L BKA, DVT, CHF, COPD, HTN.   OT comments  Patient agreeable to OT session.  Completes bed mobility with supervision, dons prosthetic with independence after setup, and transfers from EOB and commode with supervision using RW.  Functional mobility in room using RW with close supervision for safety, cueing for safety. Patient completes bathing at sink with supervision.  Anticipate near baseline, but patient reports feeling off balance during mobility. Mild dizziness during session, but not affecting functional tasks or does not increase with OOB activity.  Will follow acutely.    Follow Up Recommendations  No OT follow up;Supervision - Intermittent    Equipment Recommendations  3 in 1 bedside commode    Recommendations for Other Services      Precautions / Restrictions Precautions Precautions: Fall Precaution Comments: L prosthetic in room Restrictions Weight Bearing Restrictions: No       Mobility Bed Mobility Overal bed mobility: Needs Assistance Bed Mobility: Supine to Sit     Supine to sit: Supervision     General bed mobility comments: no assist required, supervision for safety    Transfers Overall transfer level: Needs assistance Equipment used: Rolling walker (2 wheeled) Transfers: Sit to/from Stand Sit to Stand: Supervision         General transfer comment: supervision for safety    Balance Overall balance assessment: Needs assistance Sitting-balance support: No upper extremity supported;Feet supported Sitting balance-Leahy Scale: Good     Standing balance support: Bilateral upper extremity supported;Single extremity supported;During functional activity Standing balance-Leahy  Scale: Poor Standing balance comment: relies on at least 1 UE support                           ADL either performed or assessed with clinical judgement   ADL Overall ADL's : Needs assistance/impaired     Grooming: Set up;Sitting   Upper Body Bathing: Set up;Sitting   Lower Body Bathing: Supervison/ safety;Sit to/from stand   Upper Body Dressing : Set up;Sitting       Toilet Transfer: Supervision/safety;Ambulation;RW;Regular Toilet;Grab bars   Toileting- Clothing Manipulation and Hygiene: Supervision/safety;Sit to/from stand       Functional mobility during ADLs: Supervision/safety;Rolling walker;Cueing for safety General ADL Comments: pt reports near baseline, but still feels "off" with her balance.  No changes in dizziness today with activity, functional ADLs with supervision.     Vision       Perception     Praxis      Cognition Arousal/Alertness: Awake/alert Behavior During Therapy: WFL for tasks assessed/performed Overall Cognitive Status: Within Functional Limits for tasks assessed                                          Exercises     Shoulder Instructions       General Comments func mobility to/from restroom with supervision using RW, cueing for safety; dons prosthesis with independence after setup    Pertinent Vitals/ Pain       Pain Assessment: No/denies pain  Home Living  Prior Functioning/Environment              Frequency  Min 2X/week        Progress Toward Goals  OT Goals(current goals can now be found in the care plan section)  Progress towards OT goals: Progressing toward goals  Acute Rehab OT Goals Patient Stated Goal: for the dizziness to not come back OT Goal Formulation: With patient  Plan Discharge plan remains appropriate;Frequency remains appropriate    Co-evaluation                 AM-PAC OT "6 Clicks" Daily Activity      Outcome Measure   Help from another person eating meals?: None Help from another person taking care of personal grooming?: A Little Help from another person toileting, which includes using toliet, bedpan, or urinal?: A Little Help from another person bathing (including washing, rinsing, drying)?: A Little Help from another person to put on and taking off regular upper body clothing?: A Little Help from another person to put on and taking off regular lower body clothing?: A Little 6 Click Score: 19    End of Session Equipment Utilized During Treatment: Rolling walker;Other (comment) (L prosthetic)  OT Visit Diagnosis: Other abnormalities of gait and mobility (R26.89);Dizziness and giddiness (R42);Other symptoms and signs involving the nervous system (R29.898)   Activity Tolerance Patient tolerated treatment well   Patient Left with call bell/phone within reach;Other (comment) (seated EOB)   Nurse Communication Mobility status        Time: 8841-6606 OT Time Calculation (min): 37 min  Charges: OT Treatments $Self Care/Home Management : 23-37 mins  Jolaine Artist, OT Acute Rehabilitation Services Pager [phone] Office [phone]    Delight Stare 03/05/2021, 1:09 PM

## 2021-03-05 NOTE — Progress Notes (Signed)
Subjective:   Jennifer [name] endorses frustration having not been told a concise diagnosis that could explain her many symptoms. She continues to experience intermittent headache originating from behind her left eye associated with subjective numbness of the entire left side of her face. States her nausea and vomiting have resolved and notes she is "always thirsty" due to her diabetes. She notes tingling down her legs originating from her back but notes this is chronic and says she follows with a neurologist outpatient who is considering a spinal stimulator implant for this. She notes she has follow up with him on March 22nd.   Objective:  Vital signs in last 24 hours: Vitals:   03/05/21 0332 03/05/21 0746 03/05/21 1016 03/05/21 1133  BP: 132/63 (!) 135/55 (!) 149/70 (!) 148/51  Pulse: 72 72 72 73  Resp: 18 18  18   Temp: 98.1 F (36.7 C) 98.3 F (36.8 C)  98.1 F (36.7 C)  TempSrc: Oral Oral  Oral  SpO2: 91% 98%  100%  Weight:      Height:       General: Patient appears tired but well. No acute distress.  Eyes: Baseline strabismus of left eye. Full EOMI bilaterally. Sclera non-icteric. No conjunctival injection. No tearing or drainage.   HENT: MMM. No nasal drainage. No palpable cheek masses.  Cardiovascular: Regular rate and rhythm. No murmurs, rubs, or gallops.  Neurological: There is diminished sensation of the V1/V2/V3 segments of CN V. Otherwise, CN II-XII intact. Strength is 5/5 in LUE, 4+/5 in RUE and 5/5 in RLE.   Assessment/Plan:  Principal Problem:   Acute vestibular syndrome Active Problems:   Diabetes mellitus type 2, controlled, with complications (HCC)   Abnormality of gait and recurrent falls   Severe obesity (BMI >= 40) (HCC)   Chronic diastolic heart failure (HCC)   Tobacco abuse   Dizziness  # Acute Vestibular Syndrome # Headache    Ms. Minetti continues to have intermittent L-sided periorbital headache associated with dizziness. Nausea and vomiting have  resolved with phenergan PRN. Etiology unclear, although MRI brain w/ and wo contrast shows no significant change in white matter lesions since 2008. Autoimmune workup and MS workup was unremarkable. Although MS remains on the differential, the lack of change on imaging with chronicity of symptoms makes this less likely. On further history, her dizziness and headache seem to coincide, suggesting possible complex migraine. Given history of hearing loss and tinnitus with dizziness, consider Meniere's disease. 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 She does note some memory troubles and ataxia as well. She is not the typical age, although also consider NPH. Stroke workup unremarkable. PT do not believe cause is BPPV and history is not consistent with this. Neurology have been consulted, appreciate their recommendations.  - From neurological standpoint, issues listed above are chronic. Patient will need follow up in outpatient setting  - Started on topiramate 50mg  twice daily  - Continue tylenol 650mg  q6 hours PRN for ongoing pain - Continue phenergan 25mg  q6 hours PRN   # Urinary Frequency  Patient states she has struggled chronically with urinary frequency and urgency. She is on oxybutynin at home. Started on Myrbetriq here. Urinating well without foley - no concern for retention presently. May be in setting of elevated sugars.  - Consider possibility of NPH in outpatient setting given subjective memory loss and ataxia,  although head imaging does not show signs of this  - Outpatient follow up   # IDDM Type II  Sugars remain elevated on Lantus 35 units nightly and Moderate SSI.  - Increase Lantus to 38 units nightly  - Increase to resistant SSI with 0-5 bedtime coverage   # CKD stage 3a  Creatinine is increased from 0.93 on admission to 1.49 with  decrease in GFR. Likely prerenal component in setting of N/V although patient states this has improved and she continues to tolerate PO intake.  - Restart LR @ 122mL/hr for 10 hours - Continue to trend renal function  - Avoid nephrotoxic agents   Prior to Admission Living Arrangement: Home Anticipated Discharge Location: Home with [facility] Barriers to Discharge: Further medical workup   Jeralyn Bennett, MD 03/05/2021, 1:40 PM Pager: [phone] After 5pm on weekdays and 1pm on weekends: On Call pager [phone]
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
## 2021-03-06 ENCOUNTER — Telehealth: Payer: Self-pay

## 2021-03-06 DIAGNOSIS — R350 Frequency of micturition: Secondary | ICD-10-CM | POA: Diagnosis not present

## 2021-03-06 DIAGNOSIS — E041 Nontoxic single thyroid nodule: Secondary | ICD-10-CM | POA: Diagnosis not present

## 2021-03-06 DIAGNOSIS — E119 Type 2 diabetes mellitus without complications: Secondary | ICD-10-CM

## 2021-03-06 DIAGNOSIS — H8190 Unspecified disorder of vestibular function, unspecified ear: Secondary | ICD-10-CM

## 2021-03-06 DIAGNOSIS — R519 Headache, unspecified: Secondary | ICD-10-CM

## 2021-03-06 DIAGNOSIS — N1831 Chronic kidney disease, stage 3a: Secondary | ICD-10-CM

## 2021-03-06 DIAGNOSIS — M4802 Spinal stenosis, cervical region: Secondary | ICD-10-CM

## 2021-03-06 LAB — MAGNESIUM: Magnesium: 1.7 mg/dL (ref 1.7–2.4)

## 2021-03-06 LAB — CBC
HCT: 38.1 % (ref 36.0–46.0)
Hemoglobin: 12.4 g/dL (ref 12.0–15.0)
MCH: 31.6 pg (ref 26.0–34.0)
MCHC: 32.5 g/dL (ref 30.0–36.0)
MCV: 96.9 fL (ref 80.0–100.0)
Platelets: 108 10*3/uL — ABNORMAL LOW (ref 150–400)
RBC: 3.93 MIL/uL (ref 3.87–5.11)
RDW: 14 % (ref 11.5–15.5)
WBC: 6.4 10*3/uL (ref 4.0–10.5)
nRBC: 0 % (ref 0.0–0.2)

## 2021-03-06 LAB — RENAL FUNCTION PANEL
Albumin: 2.8 g/dL — ABNORMAL LOW (ref 3.5–5.0)
Anion gap: 7 (ref 5–15)
BUN: 19 mg/dL (ref 8–23)
CO2: 26 mmol/L (ref 22–32)
Calcium: 9.1 mg/dL (ref 8.9–10.3)
Chloride: 103 mmol/L (ref 98–111)
Creatinine, Ser: 1.17 mg/dL — ABNORMAL HIGH (ref 0.44–1.00)
GFR, Estimated: 53 mL/min — ABNORMAL LOW (ref >60)
Glucose, Bld: 249 mg/dL — ABNORMAL HIGH (ref 70–99)
Phosphorus: 3.5 mg/dL (ref 2.5–4.6)
Potassium: 5.3 mmol/L — ABNORMAL HIGH (ref 3.5–5.1)
Sodium: 136 mmol/L (ref 135–145)

## 2021-03-06 LAB — GLUCOSE, CAPILLARY
Glucose-Capillary: 186 mg/dL — ABNORMAL HIGH (ref 70–99)
Glucose-Capillary: 203 mg/dL — ABNORMAL HIGH (ref 70–99)
Glucose-Capillary: 216 mg/dL — ABNORMAL HIGH (ref 70–99)
Glucose-Capillary: 244 mg/dL — ABNORMAL HIGH (ref 70–99)
Glucose-Capillary: 269 mg/dL — ABNORMAL HIGH (ref 70–99)

## 2021-03-06 MED ORDER — PROMETHAZINE HCL 25 MG PO TABS: 25.0000 mg | ORAL_TABLET | Freq: Four times a day (QID) | ORAL | 0 refills | Status: DC | PRN

## 2021-03-06 MED ORDER — TOPIRAMATE 50 MG PO TABS: 50.0000 mg | ORAL_TABLET | Freq: Two times a day (BID) | ORAL | 0 refills | Status: DC

## 2021-03-06 MED ORDER — MAGNESIUM SULFATE 2 GM/50ML IV SOLN
2.0000 g | Freq: Once | INTRAVENOUS | Status: AC
  Administered 2021-03-06: 2 g via INTRAVENOUS
  Filled 2021-03-06: qty 50

## 2021-03-06 NOTE — Discharge Summary (Incomplete)
   Name: MUMTAZ LOVINS MRN: [id number] DOB: 1961/01/05 60 y.o. PCP: Angelica Pou, MD  Date of Admission: 03/02/2021  4:57 PM Date of Discharge:  Attending Physician: Sid Falcon, MD  Discharge Diagnosis: 1. ***  Discharge Medications: Allergies as of 03/06/2021      Reactions   Abilify [aripiprazole] Other (See Comments)   Urinary freq Nov 2016   Iohexol Other (See Comments)   IV CONTRAST CAUSED TRANSIENT NEPHROPATHY (KIDNEY INSUFFICIENCY) IN 2007   Ivp Dye [iodinated Diagnostic Agents] Other (See Comments)   IV CONTRAST CAUSED TRANSIENT NEPHROPATHY (KIDNEY INSUFFICIENCY) IN 2007   Morphine Sulfate Itching, Rash    Med Rec must be completed prior to using this [facility]***        Durable Medical Equipment  (From admission, onward)         Start     Ordered   03/05/21 1538  For home use only DME 3 n 1  Once       Comments: Bariatric or wide size   03/05/21 1537   03/05/21 1537  For home use only DME 4 wheeled rolling walker with seat  Once       Comments: Bariatric size  Question:  Patient needs a walker to treat with the following condition  Answer:  Acute vestibular syndrome   03/05/21 1537          Disposition and follow-up:   Ms.Tailor L Rogoff was discharged from [facility] in {DISCHARGE CONDITION:19696} condition.  At the hospital follow up visit please address:  1.  ***  2.  Labs / imaging needed at time of follow-up: ***  3.  Pending labs/ test needing follow-up: ***  Follow-up Appointments:   Hospital Course by problem list: 1. ***   Discharge Subjective:  She continues to have headaches. She notes she has a follow up appointment with her neurologist at the end of the month.   Discharge Exam:   BP 126/77 (BP Location: Right Arm)   Pulse 68   Temp 97.6 F (36.4 C) (Oral)   Resp 17   Ht 5\' 7"  (1.702 m)   Wt 117.9 kg   SpO2 93%   BMI 40.72 kg/m  Discharge exam: ***  Pertinent Labs, Studies, and  Procedures:  ***  Discharge Instructions:   Signed: Jeralyn Bennett, MD 03/06/2021, 5:54 AM   Pager: [phone]

## 2021-03-06 NOTE — Care Management Obs Status (Signed)
[place] NOTIFICATION   Patient Details  Name: Jennifer [name] MRN: [id number] Date of Birth: 1961/06/24   Medicare Observation Status Notification Given:  Yes    Pollie Friar, RN 03/06/2021, 8:37 AM

## 2021-03-06 NOTE — TOC Transition Note (Signed)
Transition of Care [facility]) - CM/SW Discharge Note   Patient Details  Name: Jennifer [name] MRN: [id number] Date of Birth: Jan 18, 1961  [facility]) CM/SW Contact:  Pollie Friar, RN Phone Number: 03/06/2021, 4:15 PM   Clinical Narrative:    Patient discharging home with [facility] services through [facility]. Information on the AVS. Pt has transport home.   Final next level of care: Home w Home Health Services Barriers to Discharge: No Barriers Identified   Patient Goals and CMS Choice   CMS Medicare.gov Compare Post Acute Care list provided to:: Patient Choice offered to / list presented to : Patient  Discharge Placement                       Discharge Plan and Services   Discharge Planning Services: CM Consult Post Acute Care Choice: Home Health,Durable Medical Equipment          DME Arranged: 3-N-1,Walker rolling with seat (Bariatric size) DME Agency: [employer] Date DME Agency Contacted: 03/05/21   Representative spoke with at DME Agency: [employer] [place]: PT [place]: [place] ([place]) Date [place]: 03/05/21   Representative spoke with at [place]: [place] ([place]) Interventions     Readmission Risk Interventions No flowsheet data found.

## 2021-03-06 NOTE — Progress Notes (Signed)
Inpatient Diabetes Program Recommendations  AACE/ADA: New Consensus Statement on Inpatient Glycemic Control (2015)  Target Ranges:  Prepandial:   less than 140 mg/dL      Peak postprandial:   less than 180 mg/dL (1-2 hours)      Critically ill patients:  140 - 180 mg/dL   Lab Results  Component Value Date   GLUCAP 269 (H) 03/06/2021   HGBA1C 8.0 (H) 03/04/2021    Review of Glycemic Control Results for Jennifer [name], Jennifer [name] (MRN [id number]) as of 03/06/2021 09:10  Ref. Range 03/05/2021 07:46 03/05/2021 11:32 03/05/2021 16:03 03/05/2021 20:22 03/06/2021 00:10 03/06/2021 03:57 03/06/2021 07:31  Glucose-Capillary Latest Ref Range: 70 - 99 mg/dL 243 (H) 240 (H) 269 (H) 217 (H) 244 (H) 216 (H) 269 (H)    Diabetes history: DM 2 Outpatient Diabetes medications: Tresiba 70 units Daily, Humalog 2-4 units tid, Metformin 500 mg Daily, Ozempic 0.5 mg Qmonday Current orders for Inpatient glycemic control: Lantus 38 units qhs, Novolog 0-15 units tid + hs  Inpatient Diabetes Program Recommendations:    Pt received a total of 34 units of Novolog and trends were still in the 200 range.  - Consider increasing Lantus to 50 units  Thanks,  Tama Headings RN, MSN, BC-ADM Inpatient Diabetes Coordinator Team Pager [phone] (8a-5p)

## 2021-03-06 NOTE — Discharge Summary (Signed)
Name: Jennifer [name] MRN: [id number] DOB: 12/21/1961 60 y.o. PCP: Angelica Pou, MD  Date of Admission: 03/02/2021  4:57 PM Date of Discharge: 03/06/21 Attending Physician: Dr. Aldine Contes  Discharge Diagnosis: 1. Complex Migraine 2. Nausea  3. Overactive Bladder  4. Spinal Stenosis  5. IDDM Type II  6. AKI on CKD stage 3a 7. Hyperkalemia  8. Chronic Thrombocytopenia 9. Multiple Thyroid Nodules  10. Multiple Parotid Cystic Lesions   Discharge Medications: Allergies as of 03/06/2021      Reactions   Abilify [aripiprazole] Other (See Comments)   Urinary freq Nov 2016   Iohexol Other (See Comments)   IV CONTRAST CAUSED TRANSIENT NEPHROPATHY (KIDNEY INSUFFICIENCY) IN 2007   Ivp Dye [iodinated Diagnostic Agents] Other (See Comments)   IV CONTRAST CAUSED TRANSIENT NEPHROPATHY (KIDNEY INSUFFICIENCY) IN 2007   Morphine Sulfate Itching, Rash      Medication List    STOP taking these medications   oxybutynin 5 MG 24 hr tablet Commonly known as: DITROPAN-XL     TAKE these medications   albuterol 108 (90 Base) MCG/ACT inhaler Commonly known as: ProAir HFA INHALE 2 PUFFS BY MOUTH EVERY 6 HOURS AS NEEDED FOR WHEEZING What changed:   how much to take  how to take this  when to take this  reasons to take this  additional instructions   amLODipine 10 MG tablet Commonly known as: NORVASC Take 1 tablet (10 mg total) by mouth daily. What changed: when to take this   Anoro Ellipta 62.5-25 MCG/INH Aepb Generic drug: umeclidinium-vilanterol Inhale 1 puff into the lungs daily. What changed: when to take this   aspirin EC 81 MG tablet Take 1 tablet (81 mg total) by mouth daily. What changed: when to take this   baclofen 10 MG tablet Commonly known as: LIORESAL Take 1 tab as needed for muscle spasms up to twice a day What changed:   how much to take  how to take this  when to take this  additional instructions   benazepril 40 MG tablet Commonly  known as: LOTENSIN Take 1 tablet (40 mg total) by mouth daily. What changed: when to take this   buPROPion 300 MG 24 hr tablet Commonly known as: WELLBUTRIN XL Take 1 tablet (300 mg total) by mouth daily. What changed: when to take this   diclofenac Sodium 1 % Gel Commonly known as: Voltaren Apply 2 g topically 4 (four) times daily. For the L elbow painful area.   fluticasone 50 MCG/ACT nasal spray Commonly known as: FLONASE Place 1 spray into both nostrils daily as needed for allergies or rhinitis.   insulin lispro 100 UNIT/ML KwikPen Commonly known as: HumaLOG KwikPen IF BLOOD SUGAR IS < 175 DO NOT TAKE ANY CORRECTION INSULIN. 176-225 INJECT 2 UNITS. 226-275 INJECT 4 UNITS. 450-388 INJECT 6 UNITS. 326-375 INJECT 8 UNITS. 828-003 INJECT 10 UNITS. 426-475 INJECT 12 UNITS. 491-791 INJECT 14 UNITS AND CALL OFFICE FOR FURTHER INSTRUCTIONS. What changed:   how much to take  how to take this  when to take this  additional instructions   Insulin Pen Needle 32G X 4 MM Misc Use to inject insulin 4 times a day and semaglutide once weekly   Insulin Syringe-Needle U-100 31G X 15/64" 0.3 ML Misc Use with Humalog to correct blood sugar before meals three times a day   metFORMIN 500 MG tablet Commonly known as: GLUCOPHAGE Take 1 tablet (500 mg total) by mouth daily with breakfast.   metoprolol succinate 50  MG 24 hr tablet Commonly known as: Toprol XL Take 1 tablet (50 mg total) by mouth daily. Take with or immediately following a meal. What changed: when to take this   mirabegron ER 50 MG Tb24 tablet Commonly known as: MYRBETRIQ Take 50 mg by mouth in the morning.   multivitamin with minerals Tabs tablet Take 1 tablet by mouth in the morning.   nystatin powder Commonly known as: nystatin Apply 1 application topically 3 (three) times daily.   omeprazole 20 MG capsule Commonly known as: PRILOSEC Take 2 capsules (40 mg total) by mouth daily. What changed: when to take this    onetouch ultrasoft lancets Use as instructed   OneTouch Verio Flex System w/Device Kit Check 4 times a day   OneTouch Verio test strip Generic drug: glucose blood 1 each by Other route as needed for other. Use as instructed. Tests 3-4 times daily.   oxyCODONE-acetaminophen 5-325 MG tablet Commonly known as: PERCOCET/ROXICET Take 1 tablet by mouth every 8 (eight) hours as needed for up to 100 doses for severe pain. May take a 4th pill if severe pain (up to 10 days monthly) #100 equals 30 day supply What changed:   when to take this  additional instructions   Ozempic (0.25 or 0.5 MG/DOSE) 2 MG/1.5ML Sopn Generic drug: Semaglutide(0.25 or 0.5MG/DOS) Inject 0.5 mg into the skin every Monday for 24 doses.   pregabalin 200 MG capsule Commonly known as: LYRICA TAKE 1 CAPSULE BY MOUTH THREE TIMES A DAY What changed:   how much to take  how to take this  when to take this  additional instructions   promethazine 25 MG tablet Commonly known as: PHENERGAN Take 1 tablet (25 mg total) by mouth every 6 (six) hours as needed for nausea or vomiting.   rosuvastatin 20 MG tablet Commonly known as: CRESTOR Take 1 tablet (20 mg total) by mouth at bedtime. What changed: when to take this   solifenacin 5 MG tablet Commonly known as: VESICARE Take 5 mg by mouth in the morning.   topiramate 50 MG tablet Commonly known as: TOPAMAX Take 1 tablet (50 mg total) by mouth 2 (two) times daily.   traZODone 100 MG tablet Commonly known as: DESYREL Take 100 mg by mouth at bedtime as needed for sleep.   [name] FlexTouch 200 UNIT/ML FlexTouch Pen Generic drug: insulin degludec Inject 70 Units into the skin daily. What changed: when to take this   vitamin C 500 MG tablet Commonly known as: ASCORBIC ACID Take 500 mg by mouth in the morning.   Vitamin D 50 MCG (2000 UT) Caps Take 1 capsule (2,000 Units total) by mouth daily. What changed: when to take this            Durable  Medical Equipment  (From admission, onward)         Start     Ordered   03/05/21 1538  For home use only DME 3 n 1  Once       Comments: Bariatric or wide size   03/05/21 1537   03/05/21 1537  For home use only DME 4 wheeled rolling walker with seat  Once       Comments: Bariatric size  Question:  Patient needs a walker to treat with the following condition  Answer:  Acute vestibular syndrome   03/05/21 1537          Disposition and follow-up:   Jennifer [name] was discharged from [facility]  in Stable condition.  At the hospital follow up visit please address:  Hospital Follow up:   1. Complex Migraine - Please assess response to Topomax. Please be sure she follows up with her neurologist. If headache or vertigo persists, consider other cause such as MS, Meniere's disease, CN involvement / possible Warthin's Tumors, NPH, etc.  2. Nausea - Please be sure this resolves with treatment; discharged with short course Phenergan PRN 3. Overactive Bladder - Continued home myrbetriq and solifenacin; discontinued oxybutynin. Please continue to monitor / consider urology referral. 4. Spinal Stenosis - Patient states she is currently being worked up for spinal stimulator. Please continue to monitor. 5. IDDM Type II - Restarted on home medications 6. AKI on CKD stage 3a / Hyperkalemia - Kidney function improved with fluids. Please repeat labs. 7. Thrombocytopenia - Cause is unclear at this time. Continue to monitor and consider further outpatient workup. 8. Multiple Thyroid Nodules - incidentally found on carotid ultrasound. TSH normal. Would benefit from thyroid ultrasound.  2.  Labs / imaging needed at time of follow-up: CBC, BMP, thyroid ultrasound  3.  Pending labs/ test needing follow-up: None   Follow-up Appointments:  Follow-up Information    Gaylan Gerold, DO. Go on 03/13/2021.   Specialty: Internal Medicine Why: You have been scheduled for an [facility] appointment at  1:15pm next Wednesday, 03/12/21. Please call to reschedule if unable to attend. Contact information: [place] [address] [place] Follow up.   Why: The home health agency will contact you for the first home visit Contact information: [place] by problem list:  # Acute Vestibular Syndrome # Headache    Jennifer [name] presented with 1-2 weeks of intermittent left-sided headaches originating from the periorbital region, subjective numbness over her entire left face, nausea/vomiting, and dizziness (lasting hours at a time), associated with her headache episodes. She denies history of significant headaches in the past. CT head was unremarkable. MR brain without contrast showed supratentorial white matter lesions, unchanged from previous MR > 1 year ago, although radiology questioned possible new lesions in the pons and medulla that may explain patient's symptoms. Initially, we were concerned for MS; however, MR brain with contrast did not show any new enhancing lesions and she had similar appearing MR back in 2008 with negative MS workup at that time. She had also had a negative autoimmune workup in the past. Stroke workup was performed with the following findings: MRA with mild R cavernous ICA stenosis. Carotid duplex ultrasound showed 1-39% stenosis of the right and left ICA's with otherwise normal flow. TTE showed grade I diastolic dysfunction although no evidence of structural defects or clots. PT evaluated for BPPV although do not believe there is a postural component to her vertigo symptoms. Neurology were consulted although they did not believe her symptoms were clearly consistent with any neurological process. She was started on Topomax for presumed complex migraines with minimal improvement although did have good improvement in nausea with Phenergan. Recommendation is for outpatient neurology follow up, which  patient says she already has scheduled for 03/12/21.   # Multiple Thyroid Nodules and Bilateral Parotid Cysts Jennifer [name] was incidentally found to have multiple thyroid nodules and bilateral parotid cysts on imaging obtained for stroke rule out. TSH was normal. She did not have facial numbness to suggest CN VII lesion. Consider further outpatient workup.  #  Urinary Frequency/Urgency Patient states she has struggled chronically with urinary frequency and urgency. She has been followed outpatient for overactive bladder. Her home oxybutynin was discontinued due to concern this may be worsening her symptoms, although myrbetriq and solifenacin were contined. She did not have any sign of urinary retention.   # IDDM Type II  Sugars remain elevated on Lantus 38 units nightly and Resistant SSI (uptitrated since admission). Hemoglobin A1c was 8.0. She was discharged on home medications.   # CKD stage 3a  Creatinine increased from 0.93 on admission to 1.49 with decrease in GFR. Likely prerenal component in setting of N/V as her creatinine returned to baseline after fluids. She did have hyperkalemia on discharge at 5.3 although suspect there was some component of hemolysis given previously normal K and baseline kidney function.   # Spinal Stenosis  Spinal imaging obtained, which showed foraminal stenosis at multiple levels (see above) without cord compression. She states she is being followed up outpatient for spinal stimulator. Pain was overall controlled on Lyrica, Tylenol, and Percocet 5-356m q12 hours in the hospital.   Discharge Subjective:   Ms. FMassmannstates that she continues to experience left sided headache, although currently does not have nausea or dizziness. She reiterates that she has a neurology follow up appointment 03/12/21 and is okay with plan for [facility] No questions or concerns.   Discharge Exam:   BP 117/88 (BP Location: Right Arm)   Pulse 75   Temp 98  F (36.7 C)   Resp 20   Ht 5' 7"  (1.702 m)   Wt 117.9 kg   SpO2 99%   BMI 40.72 kg/m  Discharge exam:  General: Patient appears tired but well. No acute distress.  Eyes: Baseline strabismus of left eye. Full EOMI bilaterally. Sclera non-icteric. No conjunctival injection. No tearing or drainage.   HENT: MMM. No nasal drainage.  Cardiovascular: Regular rate and rhythm. No murmurs, rubs, or gallops.  Neurological: There is diminished sensation of the V1/V2/V3 segments of CN V. Otherwise, CN II-XII intact. Strength is 5/5 in LUE, 4+/5 in RUE and 5/5 in RLE.  Skin: Chronic venous stasis changes present on the RLE.   Pertinent Labs, Studies, and Procedures:   Discharge Labs: Cr 1.17, GFR 53 K 5.3 Mg 1.7 Platelets 108 Glucose 203 LDL 43 Hgb A1c 8  CT Head without Contrast 03/02/21 - No acute findings  MR BRAIN WITHOUT CONTRAST 03/02/21: Brain: No acute infarct, mass effect or extra-axial collection. No acute or chronic hemorrhage. Unchanged distribution of numerous bilateral supratentorial white matter lesions. The midline structures are normal. Vascular: Major flow voids are preserved. Skull and upper cervical spine: Normal calvarium and skull base. Visualized upper cervical spine and soft tissues are normal. Sinuses/Orbits:No paranasal sinus fluid levels or advanced mucosal thickening. No mastoid or middle ear effusion. Normal orbits. IMPRESSION: 1. No acute intracranial abnormality. 2. Unchanged distribution of numerous bilateral supratentorial white matter lesions. Electronically Signed   By: KUlyses JarredM.D.   On: 03/02/2021 23:35 MRI HEAD WITH CONTRAST 03/04/21: COMPARISON:  Prior brain MRI examinations 03/02/2021 and earlier. MRA head 03/04/2021. FINDINGS: Brain: A limited contrast-enhanced brain MRI was performed as a contrast-enhanced follow-up to the recent noncontrast MRI of 03/02/2021. The current examination consists of an axial precontrast T1 weighted  sequence, a coronal T2 weighted sequence, as well as axial and coronal postcontrast T1 weighted sequences. Redemonstration of numerous T2 hyperintense supratentorial white matter lesions bilaterally, incompletely assessed on this limited protocol  brain MRI. No pathologic intracranial enhancement is demonstrated. Vascular: Expected enhancement of the proximal large arterial vessels and dural venous sinuses. Skull and upper cervical spine: No suspicious enhancing marrow lesion. Sinuses/Orbits: No pathologic enhancement within the orbits. Minimal ethmoid sinus mucosal thickening bilaterally. Other: As before, there are multiple bilateral parotid nodules the largest in the right gland measuring 11 mm. IMPRESSION: Mildly motion degraded exam. No pathologic intracranial enhancement. Electronically Signed   By: Kellie Simmering DO   On: 03/04/2021 11:58 MRA HEAD WITHOUT CONTRAST 03/04/21: COMPARISON:  None. FINDINGS: The visualized distal vertebral arteries are widely patent to the basilar with the left being mildly dominant. Patent PICAs, AICAs, and SCAs are seen bilaterally. The basilar artery is widely patent. There is a moderate-sized right posterior communicating artery. Both PCAs are patent without evidence of a significant proximal stenosis. The internal carotid arteries are patent from skull base to carotid termini with mild right cavernous stenosis. ACAs and MCAs are patent without evidence of a proximal branch occlusion or significant proximal stenosis. No aneurysm is identified. IMPRESSION: 1. Mild right cavernous ICA stenosis. 2. Otherwise unremarkable head MRA. Electronically Signed   By: Logan Bores M.D.   On: 03/04/2021 08:14 TEE 03/04/21: 1. Left ventricular ejection fraction, by estimation, is 60 to 65%. The  left ventricle has normal function. The left ventricle has no regional  wall motion abnormalities. Left ventricular diastolic parameters are  consistent with Grade  I diastolic  dysfunction (impaired relaxation).  2. Right ventricular systolic function is normal. The right ventricular  size is normal.  3. Right atrial size was mildly dilated.  4. The mitral valve is normal in structure. No evidence of mitral valve  regurgitation.  5. The aortic valve is normal in structure. Aortic valve regurgitation is  not visualized.  Carotid Duplex Ultrasound 03/04/21: Right Carotid: Velocities in the right ICA are consistent with a 1-39%  stenosis.  Incidental: Appearance of multiple hypoechoic nodules  within right thyroid lobe.  Left Carotid: Velocities in the left ICA are consistent with a 1-39%  stenosis.  Incidental: Appearance of multiple hypoechoic nodules within  leftthyroid lobe.  Vertebrals: Bilateral vertebral arteries demonstrate antegrade flow.  Subclavians: Normal flow hemodynamics were seen in bilateral subclavian arteries.   MR CERVICAL SPINE IMPRESSION 1. Unchanged left foraminal disc protrusion with uncovertebral spurring at C2-3 causing mild left foraminal stenosis. 2. No cervical spinal canal or neural foraminal stenosis. Electronically Signed   By: Ulyses Jarred M.D.   On: 03/03/2021 19:20 MR THORACIC SPINE IMPRESSION. 1. Mild degenerative disc disease of the thoracic spine without significant spinal canal or neural foraminal stenosis at any level. MR LUMBAR SPINE IMPRESSION. 1. Advanced hypertrophic facet degenerative changes with bilateral joint effusions at L4-5 and ligamentum flavum redundancy resulting in mild spinal canal stenosis with narrowing of the bilateral subarticular zones. 2. No significant spinal canal or neural foraminal stenosis at the remaining levels. Electronically Signed   By: Pedro Earls M.D.   On: 02/28/2021 17:23   Discharge Instructions: Discharge Instructions    (HEART FAILURE PATIENTS) Call MD:  Anytime you have any of the following symptoms: 1) 3 pound weight gain in 24  hours or 5 pounds in 1 week 2) shortness of breath, with or without a dry hacking cough 3) swelling in the hands, feet or stomach 4) if you have to sleep on extra pillows at night in order to breathe.   Complete by: As directed    Call MD for:  difficulty breathing, headache or visual disturbances   Complete by: As directed    Call MD for:  extreme fatigue   Complete by: As directed    Call MD for:  persistant dizziness or light-headedness   Complete by: As directed    Call MD for:  persistant nausea and vomiting   Complete by: As directed    Diet - low sodium heart healthy   Complete by: As directed    Diet Carb Modified   Complete by: As directed    Discharge instructions   Complete by: As directed    Jennifer [name] were admitted to the hospital for further workup of your headaches, nausea, and dizziness. Brain imaging showed no significant change since previous images and your stroke work up was negative. It is thought that your headaches most likely represent complex migraines. You were started on Phenergan for nausea and Topomax for your headaches. It could be possible that you have MS, although this is less likely given persistent imaging findings with a negative workup for this in the past.  Upon discharge from the hospital, please:   - START taking Topomax, 1 pill in the morning, 1 pill in the evening every day - START taking phenergan 1 pill every 6 hours as needed for nausea or vomiting   Please STOP taking oxybutynin for now, as this may be contributing to your symptoms.   You may continue to take all of your other medications as prescribed.   I have scheduled a follow up appointment for you with Dr. Alfonse Spruce at [facility] on Wednesday 03/13/21 at 1:15pm. Please also be sure to attend your neurology appointment on 03/12/21.   Please call [phone] if you have any issues or if you experience worsening nausea/vomiting / dizziness / headaches despite the above or go to the ED if you  experience new numbness, weakness, slurred speech, or any other acute concerns.   Thank you and I hope you continue to feel better!  Dr. Konrad Penta   Increase activity slowly   Complete by: As directed       Signed: Jeralyn Bennett, MD 03/06/2021, 5:56 PM   Pager: [phone]

## 2021-03-06 NOTE — Telephone Encounter (Signed)
Dr Konrad Penta  Requested a hospital  F/u for pt  Being discharged   appt givne for 03/12/21/@2 :40 with Dr Alfonse Spruce

## 2021-03-08 ENCOUNTER — Telehealth: Payer: Self-pay

## 2021-03-08 NOTE — Telephone Encounter (Signed)
Transition Care Management Unsuccessful Follow-up Telephone Call  Date of discharge and from where:  Discharged 03/06/21 from the hospital.  Attempts:  1st Attempt  Reason for unsuccessful TCM follow-up call:  Voice mail full unable to leave a message.

## 2021-03-08 NOTE — Telephone Encounter (Signed)
Return call to [facility] Pt, with Dr [facility] - requesting verbal orders for " HHPT once a week x 1 week then twice a week x 4 weeks". Pt was recent discharged from the hospital. VO given - if not appropriate, let me know. Thanks

## 2021-03-08 NOTE — Telephone Encounter (Signed)
Agree, thank you

## 2021-03-08 NOTE — Telephone Encounter (Signed)
Kelly with [facility] requesting VO for PT. Please call back.

## 2021-03-11 NOTE — Telephone Encounter (Signed)
Transition Care Management Unsuccessful Follow-up Telephone Call  Date of discharge and from where:  Discharged 03/06/21 from the hospital.  Attempts:  2nd Attempt  Reason for unsuccessful TCM follow-up call:  Voice mail full unable to leave a message.

## 2021-03-12 ENCOUNTER — Ambulatory Visit: Payer: Medicare Other | Admitting: Physical Therapy

## 2021-03-12 ENCOUNTER — Encounter: Payer: Medicare Other | Admitting: Student

## 2021-03-13 ENCOUNTER — Encounter: Payer: Medicare Other | Admitting: Student

## 2021-03-15 ENCOUNTER — Other Ambulatory Visit: Payer: Self-pay | Admitting: *Deleted

## 2021-03-15 NOTE — Telephone Encounter (Signed)
Do I interpret correctly that this was ordered a week ago and that it is awaiting authorization?

## 2021-03-20 NOTE — Telephone Encounter (Signed)
Is there anything I need to do?

## 2021-03-20 NOTE — Telephone Encounter (Signed)
Yes, it is still awaiting authorization.

## 2021-03-25 MED ORDER — TRAZODONE HCL 100 MG PO TABS: 100.0000 mg | ORAL_TABLET | Freq: Every evening | ORAL | 2 refills | Status: DC | PRN

## 2021-03-27 LAB — HM DIABETES EYE EXAM

## 2021-03-28 ENCOUNTER — Other Ambulatory Visit: Payer: Self-pay | Admitting: Student

## 2021-03-28 NOTE — Telephone Encounter (Signed)
She will run out of this new medicine a couple of days before our hospital f/u appt.  I left a mssg on her vm to ask whether the medicine has been helpful for headache relief.  If so, I'll refill.  If not, I won't refill and we'll discuss what to do next at her upcoming visit.  I asked her to call the office to let us know which she'd prefer.  Thanks!

## 2021-04-04 ENCOUNTER — Telehealth: Payer: Self-pay

## 2021-04-04 ENCOUNTER — Other Ambulatory Visit: Payer: Self-pay | Admitting: *Deleted

## 2021-04-04 ENCOUNTER — Encounter: Payer: Self-pay | Admitting: Internal Medicine

## 2021-04-04 ENCOUNTER — Ambulatory Visit (INDEPENDENT_AMBULATORY_CARE_PROVIDER_SITE_OTHER): Payer: Medicare Other | Admitting: Internal Medicine

## 2021-04-04 VITALS — BP 168/73 | HR 79 | Temp 97.9°F | Ht 67.0 in | Wt 253.1 lb

## 2021-04-04 DIAGNOSIS — R42 Dizziness and giddiness: Secondary | ICD-10-CM

## 2021-04-04 DIAGNOSIS — R251 Tremor, unspecified: Secondary | ICD-10-CM

## 2021-04-04 DIAGNOSIS — I5032 Chronic diastolic (congestive) heart failure: Secondary | ICD-10-CM | POA: Diagnosis not present

## 2021-04-04 DIAGNOSIS — E1142 Type 2 diabetes mellitus with diabetic polyneuropathy: Secondary | ICD-10-CM

## 2021-04-04 DIAGNOSIS — N183 Chronic kidney disease, stage 3 unspecified: Secondary | ICD-10-CM

## 2021-04-04 DIAGNOSIS — Z720 Tobacco use: Secondary | ICD-10-CM

## 2021-04-04 DIAGNOSIS — E118 Type 2 diabetes mellitus with unspecified complications: Secondary | ICD-10-CM | POA: Diagnosis not present

## 2021-04-04 DIAGNOSIS — I739 Peripheral vascular disease, unspecified: Secondary | ICD-10-CM

## 2021-04-04 DIAGNOSIS — Z9889 Other specified postprocedural states: Secondary | ICD-10-CM

## 2021-04-04 DIAGNOSIS — G3184 Mild cognitive impairment, so stated: Secondary | ICD-10-CM

## 2021-04-04 DIAGNOSIS — I152 Hypertension secondary to endocrine disorders: Secondary | ICD-10-CM

## 2021-04-04 DIAGNOSIS — D696 Thrombocytopenia, unspecified: Secondary | ICD-10-CM

## 2021-04-04 DIAGNOSIS — M7712 Lateral epicondylitis, left elbow: Secondary | ICD-10-CM

## 2021-04-04 DIAGNOSIS — N3946 Mixed incontinence: Secondary | ICD-10-CM

## 2021-04-04 DIAGNOSIS — E1159 Type 2 diabetes mellitus with other circulatory complications: Secondary | ICD-10-CM

## 2021-04-04 DIAGNOSIS — E669 Obesity, unspecified: Secondary | ICD-10-CM

## 2021-04-04 DIAGNOSIS — E1122 Type 2 diabetes mellitus with diabetic chronic kidney disease: Secondary | ICD-10-CM

## 2021-04-04 DIAGNOSIS — H8190 Unspecified disorder of vestibular function, unspecified ear: Secondary | ICD-10-CM

## 2021-04-04 NOTE — Telephone Encounter (Signed)
Patient called in stating oxycodone was not at [facility]. Requesting this be sent today. Thank you.
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
## 2021-04-04 NOTE — Telephone Encounter (Signed)
Is requesting a call back she stated she is trying to find out if her medication was sent in

## 2021-04-04 NOTE — Telephone Encounter (Signed)
RTC, self-identified VM obtained, message left that PCP was sent a refill request for her pain medication. SChaplin, RN,BSN

## 2021-04-04 NOTE — Patient Instructions (Addendum)
Ms. Padgett,  I'm sorry there has been so much stress and heartache recently, and that you've been feeling poorly on top of that.  I wish things were different.  While I don't think that your symptoms are suggestive of Parkinson's disease, I know that this is on your mind and suggest a neurology consultation so that you can have an expert opinion.  Come see me in a month.  We have much to work on.  I understand that your blood pressure is high today because you haven't had a chance to take your medicine.    Please call sooner if needed.  Take care and stay well,  Dr. Maple Mirza.  Hospice offers free grief counseling.  Please call [phone] if you are interested.  This is a Network engineer.  Your loved one doesn't have to have been a patient with Hospice.  They care for all.

## 2021-04-04 NOTE — Telephone Encounter (Signed)
oxyCODONE-acetaminophen (PERCOCET/ROXICET) 5-325 MG tablet, is not at the pharmacy. Pt is using  [id number] - [place], [place] - [address]. Phone:  [phone]  Fax:  [phone]

## 2021-04-05 LAB — BMP8+ANION GAP
Anion Gap: 19 mmol/L — ABNORMAL HIGH (ref 10.0–18.0)
BUN/Creatinine Ratio: 12 (ref 12–28)
BUN: 12 mg/dL (ref 8–27)
CO2: 21 mmol/L (ref 20–29)
Calcium: 9.8 mg/dL (ref 8.7–10.3)
Chloride: 103 mmol/L (ref 96–106)
Creatinine, Ser: 0.98 mg/dL (ref 0.57–1.00)
Glucose: 183 mg/dL — ABNORMAL HIGH (ref 65–99)
Potassium: 5 mmol/L (ref 3.5–5.2)
Sodium: 143 mmol/L (ref 134–144)
eGFR: 65 mL/min/{1.73_m2} (ref >59)

## 2021-04-05 LAB — CBC
Hematocrit: 40.5 % (ref 34.0–46.6)
Hemoglobin: 12.9 g/dL (ref 11.1–15.9)
MCH: 30.4 pg (ref 26.6–33.0)
MCHC: 31.9 g/dL (ref 31.5–35.7)
MCV: 96 fL (ref 79–97)
Platelets: 149 10*3/uL — ABNORMAL LOW (ref 150–450)
RBC: 4.24 x10E6/uL (ref 3.77–5.28)
RDW: 14.4 % (ref 11.7–15.4)
WBC: 7.8 10*3/uL (ref 3.4–10.8)

## 2021-04-05 MED ORDER — OXYCODONE-ACETAMINOPHEN 5-325 MG PO TABS: 1.0000 | ORAL_TABLET | ORAL | 0 refills | Status: DC

## 2021-04-08 ENCOUNTER — Telehealth: Payer: Self-pay

## 2021-04-08 NOTE — Telephone Encounter (Signed)
Received a TC from [place], PT with [facility].  States she was planning on going to patient's home last Thursday, but patient asked if visit could be today because family was in town.  VO given to change PT visit to today Thanks, SChaplin, RN,BSN

## 2021-04-09 ENCOUNTER — Telehealth: Payer: Self-pay

## 2021-04-09 DIAGNOSIS — H269 Unspecified cataract: Secondary | ICD-10-CM | POA: Insufficient documentation

## 2021-04-09 DIAGNOSIS — M5416 Radiculopathy, lumbar region: Secondary | ICD-10-CM | POA: Insufficient documentation

## 2021-04-09 NOTE — Assessment & Plan Note (Signed)
Chronic pain didn't respond to topical NSAID - mild, not impacting ADLs, monitor.

## 2021-04-09 NOTE — Telephone Encounter (Signed)
Return call to [facility] PT, with [facility] requesting verbal orders for "PT twice a week x 2 weeks; once a week x 1 week". Stated pt has been falling, had some missed appts and "not doing as well as they hope". VO given - if not appropriate, let me know.

## 2021-04-09 NOTE — Assessment & Plan Note (Signed)
Unable to commit extended time for evaluation as planned due to intervening multiple medical problems.  Referral to neurology will be helpful.  Dff dx includes LBD.

## 2021-04-09 NOTE — Assessment & Plan Note (Signed)
Uncontrolled; did not take her medications this morning.

## 2021-04-09 NOTE — Assessment & Plan Note (Signed)
Will see DR. BEll urologist 04/23/21, he is still working on a plan for her severe UI.

## 2021-04-09 NOTE — Telephone Encounter (Signed)
Pls contact Kelly at [phone] for VO

## 2021-04-09 NOTE — Assessment & Plan Note (Signed)
No current symptoms.  On topamax for possibility of vestibular migraine; she perceives that this has been beneficial.
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
## 2021-04-09 NOTE — Assessment & Plan Note (Signed)
BMI now < 40 at 39.64

## 2021-04-09 NOTE — Assessment & Plan Note (Signed)
Sees NS on MOnday 04/08/21, will find out then if she is a candidate for a neuromodulator implant for pain.
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
## 2021-04-09 NOTE — Assessment & Plan Note (Addendum)
Assessment identical to last visit, still not taking diuretic and probably won't. A component of her edema is dependent.

## 2021-04-09 NOTE — Assessment & Plan Note (Signed)
Recheck renal fxn today - mild AKI during recent hospitalization.

## 2021-04-09 NOTE — Assessment & Plan Note (Signed)
Recent hospitalization for vertigo and HA treated as vestibular migraine. Improved/resolved.

## 2021-04-09 NOTE — Assessment & Plan Note (Signed)
Remains non-negotiable.

## 2021-04-09 NOTE — Assessment & Plan Note (Signed)
Continues to be symptomatic periodically; no tremor at rest or with intention today, motor tone in arms normal, no cogwheeling.

## 2021-04-22 ENCOUNTER — Ambulatory Visit: Payer: Medicare Other | Admitting: *Deleted

## 2021-04-22 DIAGNOSIS — Z9889 Other specified postprocedural states: Secondary | ICD-10-CM

## 2021-04-22 DIAGNOSIS — I739 Peripheral vascular disease, unspecified: Secondary | ICD-10-CM

## 2021-04-22 DIAGNOSIS — E1159 Type 2 diabetes mellitus with other circulatory complications: Secondary | ICD-10-CM

## 2021-04-22 DIAGNOSIS — E1142 Type 2 diabetes mellitus with diabetic polyneuropathy: Secondary | ICD-10-CM

## 2021-04-22 DIAGNOSIS — I5032 Chronic diastolic (congestive) heart failure: Secondary | ICD-10-CM

## 2021-04-22 DIAGNOSIS — E118 Type 2 diabetes mellitus with unspecified complications: Secondary | ICD-10-CM

## 2021-04-22 DIAGNOSIS — N183 Chronic kidney disease, stage 3 unspecified: Secondary | ICD-10-CM

## 2021-04-22 DIAGNOSIS — E1122 Type 2 diabetes mellitus with diabetic chronic kidney disease: Secondary | ICD-10-CM

## 2021-04-22 NOTE — Chronic Care Management (AMB) (Signed)
  Chronic Care Management   Note  04/22/2021 Name: Jennifer [name] MRN: [id number] DOB: [birth year]  Received voice from patient on 04/17/21 requesting assistance with housing. (This CCM RN returning to work today. Last worked day was 04/16/21)  Follow up plan: Patient was discharged from [facility] services in early January 2022 due to inability to maintain contact. Therefore will ask clinic providers for referral to CCM community care guide to assist with housing concerns.   Kelli Churn RN, CCM, [facility] RN Care Manager [phone]

## 2021-04-23 ENCOUNTER — Other Ambulatory Visit: Payer: Self-pay | Admitting: Student

## 2021-04-23 DIAGNOSIS — Z0000 Encounter for general adult medical examination without abnormal findings: Secondary | ICD-10-CM

## 2021-04-23 NOTE — Progress Notes (Signed)
Pt well known to me, I agree that alternative housing as requested by Ms. Woerner could be beneficial for her emotional and physical well being.  Thank you Dr. [name] for placing order at RN Hauser's recommendation.

## 2021-04-23 NOTE — Progress Notes (Signed)
Internal Medicine Clinic Resident  I have personally reviewed this encounter including the documentation in this note and/or discussed this patient with the care management provider. I will address any urgent items identified by the care management provider and will communicate my actions to the patient's PCP. I have placed referral to community care coordination  for housing assistance for Ms Stieber. I have reviewed the patient's CCM visit with my supervising attending, Dr Jimmye Norman.  Virl Axe, MD 04/23/2021

## 2021-04-25 ENCOUNTER — Telehealth: Payer: Self-pay | Admitting: Dietician

## 2021-04-25 NOTE — Telephone Encounter (Signed)
Dr Jimmye Norman agreed to see pt tomorrow. Called pt to see if she could come in tomorrow morning; stated she has an 10 AM appt at [facility] also she may not have transportation. But she will call me back if she can arrange to come.  Pt  called and stated she will be here in the morning at 0900 AM then go see [employer] after the appt. Instructed pt "she needs to increase her metformin to 1 tab twice a day.  And Also I will need to know her fasting blood sugar tomorrow morning, which she can report when she comes in. "  Stated she will. Also informed Christene Slates would like to see her also.

## 2021-04-25 NOTE — Telephone Encounter (Signed)
Called pt - stated she took metformin, 40 units Humalog (about 2 hrs ago), and ate lunch. I asked to re-check her BS which is 313. Please advise. Also she wants to let her doctor know about her black out spells continuing when she sits still she falls asleep and also about her tremors - call transferred to front office to schedule a hour appt.

## 2021-04-25 NOTE — Telephone Encounter (Signed)
Sugar 451 ; how much Humalog should she take; going to take metformin and other insulin now. Says hr sugar has not been this high in a long time

## 2021-04-25 NOTE — Telephone Encounter (Signed)
Thank you for calling her Jennifer [name]. I wonder when she last took her [place]. I would like her scheduled with me when she comes in to place a CGM. Thank you!

## 2021-04-26 ENCOUNTER — Other Ambulatory Visit: Payer: Self-pay

## 2021-04-26 ENCOUNTER — Telehealth: Payer: Self-pay | Admitting: *Deleted

## 2021-04-26 ENCOUNTER — Encounter: Payer: Self-pay | Admitting: Internal Medicine

## 2021-04-26 ENCOUNTER — Ambulatory Visit (INDEPENDENT_AMBULATORY_CARE_PROVIDER_SITE_OTHER): Payer: Medicare Other | Admitting: Internal Medicine

## 2021-04-26 VITALS — BP 122/65 | HR 75 | Temp 97.8°F | Ht 67.0 in | Wt 257.6 lb

## 2021-04-26 DIAGNOSIS — E118 Type 2 diabetes mellitus with unspecified complications: Secondary | ICD-10-CM | POA: Diagnosis not present

## 2021-04-26 DIAGNOSIS — G546 Phantom limb syndrome with pain: Secondary | ICD-10-CM

## 2021-04-26 DIAGNOSIS — M5440 Lumbago with sciatica, unspecified side: Secondary | ICD-10-CM

## 2021-04-26 DIAGNOSIS — E1139 Type 2 diabetes mellitus with other diabetic ophthalmic complication: Secondary | ICD-10-CM

## 2021-04-26 DIAGNOSIS — Z7984 Long term (current) use of oral hypoglycemic drugs: Secondary | ICD-10-CM

## 2021-04-26 DIAGNOSIS — G8929 Other chronic pain: Secondary | ICD-10-CM

## 2021-04-26 DIAGNOSIS — E1165 Type 2 diabetes mellitus with hyperglycemia: Secondary | ICD-10-CM

## 2021-04-26 DIAGNOSIS — H42 Glaucoma in diseases classified elsewhere: Secondary | ICD-10-CM

## 2021-04-26 DIAGNOSIS — G3184 Mild cognitive impairment, so stated: Secondary | ICD-10-CM

## 2021-04-26 DIAGNOSIS — G4734 Idiopathic sleep related nonobstructive alveolar hypoventilation: Secondary | ICD-10-CM

## 2021-04-26 DIAGNOSIS — M79673 Pain in unspecified foot: Secondary | ICD-10-CM

## 2021-04-26 DIAGNOSIS — R519 Headache, unspecified: Secondary | ICD-10-CM

## 2021-04-26 DIAGNOSIS — M549 Dorsalgia, unspecified: Secondary | ICD-10-CM

## 2021-04-26 DIAGNOSIS — IMO0002 Reserved for concepts with insufficient information to code with codable children: Secondary | ICD-10-CM

## 2021-04-26 DIAGNOSIS — M62831 Muscle spasm of calf: Secondary | ICD-10-CM

## 2021-04-26 DIAGNOSIS — T23221D Burn of second degree of single right finger (nail) except thumb, subsequent encounter: Secondary | ICD-10-CM

## 2021-04-26 DIAGNOSIS — H259 Unspecified age-related cataract: Secondary | ICD-10-CM

## 2021-04-26 DIAGNOSIS — N3946 Mixed incontinence: Secondary | ICD-10-CM

## 2021-04-26 DIAGNOSIS — F5104 Psychophysiologic insomnia: Secondary | ICD-10-CM

## 2021-04-26 DIAGNOSIS — Z794 Long term (current) use of insulin: Secondary | ICD-10-CM

## 2021-04-26 DIAGNOSIS — Z89512 Acquired absence of left leg below knee: Secondary | ICD-10-CM

## 2021-04-26 DIAGNOSIS — Z79899 Other long term (current) drug therapy: Secondary | ICD-10-CM

## 2021-04-26 NOTE — Telephone Encounter (Signed)
   Telephone encounter was:  Unsuccessful.  04/26/2021 Name: Jennifer [name] MRN: [id number] DOB: 1961/01/01  Unsuccessful outbound call made today to assist with:  Food Insecurity and Caregiver Stress  Outreach Attempt:  1st Attempt  A HIPAA compliant voice message was left requesting a return call.  Instructed patient to call back at [facility], Care Management  [phone] [address] , [place] [address] Email : [email or web address]
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
## 2021-04-27 LAB — BMP8+ANION GAP
Anion Gap: 18 mmol/L (ref 10.0–18.0)
BUN/Creatinine Ratio: 15 (ref 12–28)
BUN: 18 mg/dL (ref 8–27)
CO2: 18 mmol/L — ABNORMAL LOW (ref 20–29)
Calcium: 8.6 mg/dL — ABNORMAL LOW (ref 8.7–10.3)
Chloride: 106 mmol/L (ref 96–106)
Creatinine, Ser: 1.17 mg/dL — ABNORMAL HIGH (ref 0.57–1.00)
Glucose: 164 mg/dL — ABNORMAL HIGH (ref 65–99)
Potassium: 4 mmol/L (ref 3.5–5.2)
Sodium: 142 mmol/L (ref 134–144)
eGFR: 53 mL/min/{1.73_m2} — ABNORMAL LOW (ref >59)

## 2021-04-29 ENCOUNTER — Other Ambulatory Visit: Payer: Self-pay | Admitting: Dietician

## 2021-04-29 DIAGNOSIS — E118 Type 2 diabetes mellitus with unspecified complications: Secondary | ICD-10-CM

## 2021-04-29 MED ORDER — METFORMIN HCL 500 MG PO TABS: 500.0000 mg | ORAL_TABLET | Freq: Two times a day (BID) | ORAL | 3 refills | Status: DC

## 2021-04-29 MED ORDER — PREGABALIN 200 MG PO CAPS: ORAL_CAPSULE | ORAL | 2 refills | Status: DC

## 2021-04-29 NOTE — Assessment & Plan Note (Signed)
Appt with Dr. Katy Fitch approaching.

## 2021-04-29 NOTE — Assessment & Plan Note (Signed)
1st eye June, 2nd eye July 2022

## 2021-04-29 NOTE — Assessment & Plan Note (Signed)
Sedating medications reviewed:   Baclofen 2 pills every morning  - takes daily bc she has muscle spasm regularly, RLE thigh and into calf and foot.  Thought she could take them tid, but takes only in am.  Forgets sometimes.  Oxycodone 5 mg 1 up to 3-4 x/day.  Largely for foot (including phantom) pain. Cries in pain.  "I take it for RA all over".    Lyrica 200 mg Takes 2 pills twice a day rather than 1 tid.  Not taking Trazadone. "Since mom passed, I've just been scared to take it". She can't explain.  Topamax, new this spring, taking twice a day "if I remember". "I try to leave it out to remember".  Has helped greatly with headaches.    Refer to pharmacy consult service for adherence, organization, education with regard to safety

## 2021-04-29 NOTE — Assessment & Plan Note (Signed)
05/17/21 spinal injections planned.  "I told them I need to be put to sleep". They apparently will be able to do this per her report.
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
## 2021-04-29 NOTE — Assessment & Plan Note (Signed)
Multifactorial (environmental, chronic pain, nocturia, urinary incontinence with frequency). trazadone self-discontinued some time ago.

## 2021-04-29 NOTE — Assessment & Plan Note (Signed)
Uncontrolled hyperglycemia largely due to caloric intake (soda, snacks). Metformin increased to 500 bid. RD DM referral again for education session.

## 2021-04-29 NOTE — Assessment & Plan Note (Signed)
Appt 04/24/21. Continues to take medications which she feels are ineffective.

## 2021-04-29 NOTE — Progress Notes (Signed)
Referral request

## 2021-04-29 NOTE — Assessment & Plan Note (Signed)
Not assessed today.  She reports difficulty with memory but very accurately recalls her many upcoming appt dates. Polypharmacy, insufficient sleep, nocturnal hypoxia vs OSA suspected.  Social/family/environmental situation is dynamic and not ideal.

## 2021-04-29 NOTE — Assessment & Plan Note (Signed)
Appt today with [employer]

## 2021-05-01 ENCOUNTER — Telehealth: Payer: Self-pay | Admitting: *Deleted

## 2021-05-01 NOTE — Telephone Encounter (Signed)
    Telephone encounter was:  Unsuccessful.  05/01/2021 Name: LEAIRA FULLAM MRN: [id number] DOB: 01/09/1961  Unsuccessful outbound call made today to assist with:  Food Insecurity  Outreach Attempt:  2nd Attempt  A HIPAA compliant voice message was left requesting a return call.  Instructed patient to call back at   Instructed patient to call back at [phone]  at their earliest convenience.  [place], Care Management  [phone] [address] , [place] [address] Email : [email or web address]

## 2021-05-03 ENCOUNTER — Telehealth: Payer: Self-pay

## 2021-05-03 NOTE — Telephone Encounter (Signed)
Return call to [place] [facility], [facility] - no answer, left message to call the office.

## 2021-05-03 NOTE — Telephone Encounter (Signed)
[facility] [phone] need VO

## 2021-05-06 ENCOUNTER — Encounter: Payer: Self-pay | Admitting: Internal Medicine

## 2021-05-06 ENCOUNTER — Telehealth: Payer: Self-pay | Admitting: Internal Medicine

## 2021-05-06 MED ORDER — TOPIRAMATE 50 MG PO TABS: 50.0000 mg | ORAL_TABLET | Freq: Two times a day (BID) | ORAL | 0 refills | Status: DC

## 2021-05-06 NOTE — Telephone Encounter (Signed)
Jennifer [name] called back requesting VO to extend [facility] 1 week 9 to work on balance and tolerance to standing. Verbal auth given. Will route to PCP for agreement/denial.

## 2021-05-06 NOTE — Progress Notes (Signed)
[facility] Telephone Encounter Continuity Care Appointment  HPI:   This telephone encounter was created for [name] on 05/06/2021 for the following purpose/cc Out of Medications.   Past Medical History:  Past Medical History:  Diagnosis Date  . Cataract   . Chronic bronchitis ([place])    "I get it alot" (09/28/2013)  . Chronic diastolic heart failure (HCC)    grade 2 per 2D echocardiogram (01/2013)  . Chronic lower back pain   . Chronic pain syndrome 12/03/2011   Likely secondary to depression, "fibromyalgia", neuropathy, and obesity. Lumbar MRI 2014 no sig change from prior (2008) : Stable hypertrophic facet disease most notable at L4-5. Stable shallow left foraminal/extraforaminal disc protrusion at L4-5. No direct neural compression.      Marland Kitchen COPD 01/08/2007   PFT's 05/2007 : FEV1/FVC 82, FEV1 64% pred, FEF 25-75% 40% predicted, 16% improvement in FEV1 with bronchodilators.     . Diabetic peripheral neuropathy ([place])   . DVT of upper extremity (deep vein thrombosis) ([place]) 03/11/2013   Secondary to PICC line. Right brachial vein, diagnosed on 03/10/2013 Coumadin for 3 months. End date 06/10/2013   . Environmental allergies    Hx: of  . Fatty liver 2003   observed on ultrasound abdomen  . Fibromyalgia   . GERD (gastroesophageal reflux disease)   . Glaucoma   . History of bacterial endocarditis 2014   Endocarditis involving mitral and tricuspid valves.  S. Aureus and GBS.   Marland Kitchen History of use of hearing aid   . Hyperlipidemia   . Hyperplastic colon polyp 12/2010   Per colonoscopy (12/2010) - Dr. Deatra Ina  . Hypertension   . Juvenile rheumatoid arthritis ([place])    Diagnosed age 30; treated initially with "lots of aspirin"  . PVD (peripheral vascular disease) with claudication ([place])    Stents to bilateral common iliac arteries (left 2005, right 2008), on chronic plavix  . Pyelonephritis 10/28/2020  . S/P BKA (below knee amputation) unilateral ([place])     2014 L - failed limb preserving treatment. 2/2 tobacco use, DM, and cont weight bearing on surgical wound and developed gangrene   . Tobacco abuse   . Type II diabetes mellitus with peripheral circulatory disorders, uncontrolled DX: 1993   Insulin dep. Poor control. Complicated by diabetic foot ulcer and diabetic eye disease.        ROS:  Review of Systems  Musculoskeletal: Positive for back pain and joint pain.  Neurological: Positive for headaches.     Jennifer [name] reached out to the on call pager system for refills on her medications. Jennifer [name] states that she is out of her Topamax and percocet 5-325 mg. Patient states that her Topamax is a relatively new medication that has been very helpful for her persistent headaches. She has been out of this medications since last Friday and her headaches are coming back.   Additionally, she has been out of her Percocet which she takes for pain, which she mostly uses for her phantom limb pain, after her amputation. She states she used to be a patient of Dr. [name], and her pain medication was able to be filled in 3 month intervals, but now it is monthly. She prefers the 3 month intervals. She states that she is pain that is severe, and "I wouldn't wish this on my worst enemy, not that I have enemies."    A/P Patient calls the on call pager for medication refills. I have reviewed her Topamax  medication which has been charted, as a new medication that has greatly reduced her complex migraines.   For her Percocet, she has been prescribed this medication by the clinic chronically. She Per PDMP she is due for a refill on her medication. This medication is under the polypharmacy for sedating medications. PDMP Page has not been reviewed since 07/09/20. Will have our clinic call to schedule an appointment and will reach out to her PCP as well.  - Refill Topamax  - Hold Percocet for now - Schedule an appointment in the clinic for refill. Reach out to  PCP.   Assessment / Plan / Recommendations:   Please see A&P under problem oriented charting for assessment of the patient's acute and chronic medical conditions.   As always, pt is advised that if symptoms worsen or new symptoms arise, they should go to an urgent care facility or to to ER for further evaluation.

## 2021-05-07 ENCOUNTER — Telehealth: Payer: Self-pay | Admitting: *Deleted

## 2021-05-07 ENCOUNTER — Other Ambulatory Visit: Payer: Self-pay

## 2021-05-07 MED ORDER — OXYCODONE-ACETAMINOPHEN 5-325 MG PO TABS: 1.0000 | ORAL_TABLET | ORAL | 0 refills | Status: DC

## 2021-05-07 NOTE — Telephone Encounter (Signed)
   Telephone encounter was:  Successful.  05/07/2021 Name: Jennifer [name] MRN: [id number] DOB: 1961-02-09  Jennifer [name] is a 60 y.o. year old female who is a primary care patient of Angelica Pou, MD . The community resource team was consulted for assistance with Housing information   Care guide performed the following interventions: Patient provided with information about care guide support team and interviewed to confirm resource needs.  Follow Up Plan:  No further follow up planned at this time. The patient has been provided with needed resources. Patient is being asked to move out of the 4 bedroom apt that she and her family , Will send affordable housing lists for [place] , [place], Care Management  [phone] [address] , [place] [address] Email : [email or web address]

## 2021-05-07 NOTE — Telephone Encounter (Signed)
Pt is requesting her oxyCODONE-acetaminophen (PERCOCET/ROXICET) 5-325 MG tablet, and her topiramate (TOPAMAX) 50 MG tablet sent to  [id number] - [place], [place] - [address]. Phone:  [phone]  Fax:  [phone]      ( pt stated that her PCP was to send in her medication but never did , she stated that she spoke to a Dr yesterday who told her he would send it in with in 10 mins but never did )

## 2021-05-07 NOTE — Progress Notes (Signed)
Patient well known to me; her refill is due and appropriate (completed a few hours ago).  No extra visit for opioid refill needed.  I appreciate Dr. Jackson Latino involvement.

## 2021-05-07 NOTE — Telephone Encounter (Addendum)
Last office visit: 04/26/21 Last UDS:11/11/20 (inpatient) Last written:  04/05/21  #100 Next appt: 06/13/2021 w/pcp  topamax refill sent in yesterday by on call MD Gilford Rile)

## 2021-05-07 NOTE — Telephone Encounter (Signed)
Pt called / informed of refill. 

## 2021-05-08 NOTE — Progress Notes (Signed)
Good morning!  I reached out to Dr. Jimmye Norman, and she has taken care of it! Thanks! Remo Lipps

## 2021-05-16 ENCOUNTER — Other Ambulatory Visit: Payer: Self-pay

## 2021-05-16 ENCOUNTER — Ambulatory Visit: Payer: Medicare Other | Attending: Internal Medicine | Admitting: Physical Therapy

## 2021-05-16 DIAGNOSIS — G8929 Other chronic pain: Secondary | ICD-10-CM | POA: Insufficient documentation

## 2021-05-16 DIAGNOSIS — R296 Repeated falls: Secondary | ICD-10-CM

## 2021-05-16 DIAGNOSIS — M545 Low back pain, unspecified: Secondary | ICD-10-CM | POA: Diagnosis present

## 2021-05-16 DIAGNOSIS — M25511 Pain in right shoulder: Secondary | ICD-10-CM | POA: Diagnosis present

## 2021-05-16 DIAGNOSIS — R208 Other disturbances of skin sensation: Secondary | ICD-10-CM

## 2021-05-16 DIAGNOSIS — M6281 Muscle weakness (generalized): Secondary | ICD-10-CM | POA: Diagnosis present

## 2021-05-16 DIAGNOSIS — R293 Abnormal posture: Secondary | ICD-10-CM

## 2021-05-16 NOTE — Therapy (Addendum)
[place] [facility] [address] [place], [place], [address] Phone: [phone]   Fax:  [phone]  Physical Therapy Evaluation  Patient Details  Name: Jennifer [name] MRN: [id number] Date of Birth: [birth year] Referring Provider (PT): Angelica Pou, MD   Encounter Date: 05/16/2021   PT End of Session - 05/16/21 1314    Visit Number 1    Number of Visits 1    Date for PT Re-Evaluation 05/16/21   for power wheelchair evaluation only   Authorization Type [employer] Medicare    PT Start Time 1145    PT Stop Time 1250    PT Time Calculation (min) 65 min    Activity Tolerance Patient tolerated treatment well    Behavior During Therapy [facility] for tasks assessed/performed           Past Medical History:  Diagnosis Date  . Cataract   . Chronic bronchitis ([place])    "I get it alot" (09/28/2013)  . Chronic diastolic heart failure (HCC)    grade 2 per 2D echocardiogram (01/2013)  . Chronic lower back pain   . Chronic pain syndrome 12/03/2011   Likely secondary to depression, "fibromyalgia", neuropathy, and obesity. Lumbar MRI 2014 no sig change from prior (2008) : Stable hypertrophic facet disease most notable at L4-5. Stable shallow left foraminal/extraforaminal disc protrusion at L4-5. No direct neural compression.      Marland Kitchen COPD 01/08/2007   PFT's 05/2007 : FEV1/FVC 82, FEV1 64% pred, FEF 25-75% 40% predicted, 16% improvement in FEV1 with bronchodilators.     . Diabetic peripheral neuropathy ([place])   . DVT of upper extremity (deep vein thrombosis) ([place]) 03/11/2013   Secondary to PICC line. Right brachial vein, diagnosed on 03/10/2013 Coumadin for 3 months. End date 06/10/2013   . Environmental allergies    Hx: of  . Fatty liver 2003   observed on ultrasound abdomen  . Fibromyalgia   . GERD (gastroesophageal reflux disease)   . Glaucoma   . History of bacterial endocarditis 2014   Endocarditis involving mitral and tricuspid valves.   S. Aureus and GBS.   Marland Kitchen History of use of hearing aid   . Hyperlipidemia   . Hyperplastic colon polyp 12/2010   Per colonoscopy (12/2010) - Dr. Deatra Ina  . Hypertension   . 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 PVD (peripheral vascular disease) with claudication ([place])    Stents to bilateral common iliac arteries (left 2005, right 2008), on chronic plavix  . Pyelonephritis 10/28/2020  . S/P BKA (below knee amputation) unilateral ([place])    2014 L - failed limb preserving treatment. 2/2 tobacco use, DM, and cont weight bearing on surgical wound and developed gangrene   . Tobacco abuse   . Type II diabetes mellitus with peripheral circulatory disorders, uncontrolled DX: 1993   Insulin dep. Poor control. Complicated by diabetic foot ulcer and diabetic eye disease.      Past Surgical History:  Procedure Laterality Date  . ABDOMINAL HYSTERECTOMY  1997   secondary to uterine fibroids  . AMPUTATION Left 08/31/2013   Procedure: AMPUTATION RAY;  Surgeon: Newt Minion, MD;  Location: [place];  Service: Orthopedics;  Laterality: Left;  Left Foot 5th Ray Amputation  . AMPUTATION Left 09/28/2013   Procedure: Left Midfoot amputation;  Surgeon: Newt Minion, MD;  Location: [place];  Service: Orthopedics;  Laterality: Left;  Left Midfoot amputation  . AMPUTATION Left  10/14/2013   Procedure: AMPUTATION BELOW KNEE- left;  Surgeon: Newt Minion, MD;  Location: [place];  Service: Orthopedics;  Laterality: Left;  Left Below Knee Amputation   . AMPUTATION TOE Right 01/15/2017   Procedure: AMPUTATION 5th TOE RIGHT FOOT;  Surgeon: Edrick Kins, DPM;  Location: [place];  Service: Podiatry;  Laterality: Right;  . APPLICATION OF WOUND VAC  04/01/2019   Procedure: Application Of Wound Vac;  Surgeon: Newt Minion, MD;  Location: [place];  Service: Orthopedics;;  . BLADDER SURGERY     bladder reconstruction surgery  . BREAST BIOPSY     multiple-benign per pt  .  COLONOSCOPY    . ESOPHAGOGASTRODUODENOSCOPY N/A 09/20/2013   Procedure: ESOPHAGOGASTRODUODENOSCOPY (EGD);  Surgeon: Jerene Bears, MD;  Location: [place];  Service: Gastroenterology;  Laterality: N/A;  . FOOT AMPUTATION THROUGH METATARSAL Left 09/28/2013  . GANGLION CYST EXCISION     multiple  . PERIPHERAL VASCULAR INTERVENTION     stents in lower ext  . SHOULDER ARTHROSCOPY Right 11/11/2019   RIGHT SHOULDER ARTHROSCOPY AND DEBRIDEMENT   . SHOULDER ARTHROSCOPY Right 11/11/2019   Procedure: RIGHT SHOULDER ARTHROSCOPY AND DEBRIDEMENT;  Surgeon: Newt Minion, MD;  Location: [place];  Service: Orthopedics;  Laterality: Right;  . SHOULDER ARTHROSCOPY W/ ROTATOR CUFF REPAIR Bilateral   . SKIN SPLIT GRAFT Bilateral 05/13/2013   Procedure: Right and Left Foot Allograft Skin Graft;  Surgeon: Newt Minion, MD;  Location: [place];  Service: Orthopedics;  Laterality: Bilateral;  Right and Left Foot Allograft Skin Graft  . STUMP REVISION Left 04/01/2019   Procedure: REVISION LEFT BELOW KNEE AMPUTATION;  Surgeon: Newt Minion, MD;  Location: [place];  Service: Orthopedics;  Laterality: Left;  . TEE WITHOUT CARDIOVERSION N/A 01/31/2013   Procedure: TRANSESOPHAGEAL ECHOCARDIOGRAM (TEE);  Surgeon: Fay Records, MD;  Location: [place];  Service: Cardiovascular;  Laterality: N/A;  Rm [phone]  . TEE WITHOUT CARDIOVERSION N/A 03/10/2013   Procedure: TRANSESOPHAGEAL ECHOCARDIOGRAM (TEE);  Surgeon: Larey Dresser, MD;  Location: [place];  Service: Cardiovascular;  Laterality: N/A;  Rm. [phone]  . TOE AMPUTATION Left 08/31/2013   4TH & 5 TH TOE   . TONSILLECTOMY    . TUBAL LIGATION    . WRIST SURGERY Right    "for tumors" (09/28/2013)    There were no vitals filed for this visit.    Subjective Assessment - 05/16/21 1308    Subjective Pt arrived in manual wheelchair; pt requesting evaluation for power wheelchair.  Pt has >10 falls per week.    Pertinent History Diastolic heart failure, LBP, chronic pain  syndrome, COPD, diabetic peripheral neuropathy, DVT of UE, fibromyalgia, bacterial endocarditis, glaucoma, HLD, HTN, juvenile rheumatoid arthritis, PVD, pyelonephritis, tobacco abuse, type 2 DM, BKA L    Limitations House hold activities;Standing;Walking    Patient Stated Goals to obtain a power wheelchair    Currently in Pain? Yes              [facility] PT Assessment - 05/16/21 1311      Assessment   Medical Diagnosis Severe Obesity; power wheelchair evaluation    Referring Provider (PT) Angelica Pou, MD    Onset Date/Surgical Date 12/26/20    Hand Dominance Right    Prior Therapy yes for prosthetic training      Precautions   Precautions Other (comment);Fall    Precaution Comments Diastolic heart failure, LBP, chronic pain syndrome, COPD, diabetic peripheral neuropathy, DVT of UE, fibromyalgia, bacterial  endocarditis, glaucoma, HLD, HTN, juvenile rheumatoid arthritis, PVD, pyelonephritis, tobacco abuse, type 2 DM, BKA L      Balance Screen   Has the patient fallen in the past 6 months Yes      Prior Function   Level of Independence Independent with basic ADLs;Independent with household mobility with device;Independent with transfers;Needs assistance with homemaking            Mobility/Seating Evaluation    PATIENT INFORMATION: Name: Jennifer [name], Jennifer [name] DOB: January 27, 1961  Sex: Female Date seen: 05/16/2021 Time: 11:45  Address:  [address]  [place] [address] Physician: Angelica Pou, MD  This evaluation/justification form will serve as the LMN for the following suppliers: __________________________ Supplier: [employer] Contact Person: Deberah Pelton, ATP Phone:  ?????   Seating Therapist: Misty Stanley, PT Phone:   [phone]   Phone: [phone] (Mobile)    Spouse/Parent/Caregiver name: Jennifer [name]  Phone number: [phone] Insurance/Payer: [facility] Medicare     Reason for Referral: Power wheelchair  Patient/Caregiver Goals: To return to cooking,  for more independent mobility and decrease falls  Patient was seen for face-to-face evaluation for new power wheelchair.  Also present was ATP to discuss recommendations and wheelchair options.  Further paperwork was completed and sent to vendor.  Patient appears to qualify for power mobility device at this time per objective findings.   MEDICAL HISTORY: Diagnosis: Primary Diagnosis: L transtibial amputation Onset: 2014 Diagnosis: Diastolic Heart Failure   [] Progressive Disease Relevant past and future surgeries: May have to have back surgery soon but not scheduled; is scheduled to have cataract surgery in June and July 2022; Past surgeries include: L toe amputations, TEE without cardioversion, R and L foot skin grafts, Right shouler arthroscopy with rotator cuff repair, L foot amputation, bladder reconstruction, R toe amputation, L BKA with revision    Height: 5'7" Weight: 250 lb Explain recent changes or trends in weight: Weight fluctuation due to edema but not more than 10lb   History including Falls: >10 falls in a week - has fallen out of wheelchair, fallen off of toilet, some falls with transfers;  PMH: Diastolic heart failure, LBP, chronic pain syndrome, COPD, diabetic peripheral neuropathy, DVT of UE, fibromyalgia, bacterial endocarditis, glaucoma, HLD, HTN, juvenile rheumatoid arthritis, PVD, pyelonephritis, tobacco abuse, type 2 DM, BKA L    HOME ENVIRONMENT: [x] House  [] Condo/town home  [] Apartment  [] Assisted Living    [] Lives Alone [x]  Lives with Others                                                                                          Hours with caregiver: ?????  [] Home is accessible to patient           Stairs      [x] Yes []  No     Ramp [] Yes [x] No Comments:  Lives with sons; house is rented, has requested a ramp from land lord, has fallen down the stairs multiple times.  Also searching for new, more accessible home   COMMUNITY ADL: TRANSPORTATION: [] Car    [] Van    [x] Public  Transportation    [] Adapted w/c Lift    [] Ambulance    []   Other:       [x] Sits in wheelchair during transport  Employment/School: ????? Specific requirements pertaining to mobility ?????  Other: Transportation does not come up her driveway; not able to propel her chair up/down gravel driveway.  If she does not have assistance she has to cancel appointment    FUNCTIONAL/SENSORY PROCESSING SKILLS:  Handedness:   [x] Right     [] Left    [] NA  Comments:  ?????  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  Manufacturer:  DriveSize:  20 x 18 Age: 43-3 years  Current Condition of Mobility Base:  Poor   Current Wheelchair components:  leg rests, sling back and seat  Describe posture in present seating system:  w/c too wide for patient, seat depth too short, posterior pelvic tilt, rounded shoulders, hips shifted laterally, LE Abducted and LLE externally rotated      SENSATION and SKIN ISSUES: Sensation [] Intact  [] Impaired [x] Absent  Level of sensation: absent in hands and R lower leg, numbness in L residual limb distally Pressure Relief: Able to perform effective pressure relief :    [] Yes  [x]  No Method: Transfers to bed and lies supine If not,  Why?: Unable to perform prolonged standing due to LE collapse and falls  Skin Issues/Skin Integrity Current Skin Issues  [] Yes [x] No [x] Intact []  Red area[]  Open Area  [] Scar Tissue [x] At risk from prolonged sitting Where  ?????  History of Skin Issues  [] Yes [x] No Where  ????? When  ?????  Hx of skin flap surgeries  [x] Yes [] No Where  feet When  2014  Limited sitting tolerance [x] Yes [] No Hours spent sitting in wheelchair daily: 1 hour due to pain  Complaint of Pain:  Please describe: mid and low back pain, shoulder pain, bilat hand neuropathic pain, phantom limb pain, tennis elbow, rotator cuff surgery   Swelling/Edema: RLE due to CHF and PVD    ADL STATUS (in reference to wheelchair use):  Indep Assist Unable Indep with Equip Not assessed Comments  Dressing []  []  []  [x]  []  after showering pt returns to room and dresses in w/c  Eating []  []  []  [x]  []  w/c level  Toileting []  []  []  [x]  []  transfers wheelchair <> toilet  Bathing []  []  []  [x]  []  transfers wheelchair <> tub bench  Grooming/Hygiene []  []  []  [x]  []  w/c level  Meal Prep []  []  [x]  []  []  Not able to right now due to size of kitchen and height of countertops and cooktop  IADLS []  []  []  []  [x]  ?????  Bowel Management: [x] Continent  [] Incontinent  [] Accidents Comments:  ?????  Bladder Management: [] Continent  [x] Incontinent  [] Accidents Comments:  wears Depends     WHEELCHAIR SKILLS: Manual w/c Propulsion: [] UE or LE strength and endurance sufficient to participate in  ADLs using manual wheelchair Arm : [x] left [x] right   [] Both      Distance: 30 feet Foot:  [] left [x] right   [] Both  Operate Scooter: []  Strength, hand grip, balance and transfer appropriate for use [] Living environment is accessible for use of scooter  Operate Power w/c:  [x]  Std. Joystick   []  Alternative Controls Indep [x]  Assist []  Dependent/unable []  N/A []   [x] Safe          [x]  Functional      Distance: >500 feet  Bed confined without wheelchair  [x]  Yes []  No   STRENGTH/RANGE OF MOTION:  AROM Range of Motion Strength  Shoulder L: flexion >100 and WFL internal rotation R: Flexion <90 deg, limited internal rotation L: 4-/5 R: 3/5  Elbow WFL L: 4/5 R: 4-/5  Wrist/Hand WFL L: 4/5 R: 4-/5  Hip Lacks full extension bilaterally L: 3+/5 R: 3/5  Knee WFL L: 4-/5 R: 3+/5  Ankle WFL RLE prosthesis LLE R: 3/5     MOBILITY/BALANCE:  []  Patient is totally dependent for mobility  ?????    Balance Transfers Ambulation  Sitting Balance: Standing Balance: []  Independent []  Independent/Modified Independent  [x]  WFL     []  WFL []  Supervision []  Supervision  []  Uses UE for balance  []  Supervision [x]  Min Assist []  Ambulates with Assist  ?????    []  Min Assist [x]  Min assist []  Mod Assist []  Ambulates with Device:      []  RW  []  StW  []  Cane  []  ?????  []  Mod Assist []  Mod assist []  Max assist   []  Max Assist []  Max assist []  Dependent []  Indep. Short Distance Only  []  Unable []  Unable []  Lift / Sling Required Distance (in feet)  ?????   []  Sliding board [x]  Unable to Ambulate (see explanation below)  Cardio Status:  [] Intact  [x]  Impaired   []  NA     CHF  Respiratory Status:  [] Intact   [x] Impaired   [] NA     COPD  Orthotics/Prosthetics: L prosthesis 2015; prosthesis has fallen off when standing  Comments (Address manual vs power w/c vs scooter): Jennifer [name] has mobility limitation that significantly impairs safe, timely participation in one or more mobility related ADL's.  Jennifer [name] presents with a Left Transtibial Amputation with inability to safely ambulate with a prosthesis due to her complex medical history.  Jennifer [name]'s mobility deficit cannot be remediated with a cane or walker due to a poorly fitting prosthesis, bilateral upper and lower extremity weakness, impaired sensation, shoulder dysfunction, and cardiopulmonary limitations.  Jennifer [name] experiences more than 10 falls a week with some falls occurring because her prosthesis falls off or  her lower extremities give out due to weakness.  Jennifer [name] is unable to propel any type of manual wheelchair functionally and independently in her home or community environment due to insufficient cardiopulmonary endurance due to congestive heart failure and COPD, weakness in her upper extremities, impaired sensation in her hands, and right shoulder dysfunction and pain following rotator cuff repair.  Jennifer [name] is not able to safely operate a power scooter (POV) due to lack of upper extremity strength and sensation to drive a tiller style propulsion system.  She also lacks sufficient lower extremity strength and balance to safely transfer on to and off a power scooter.  Jennifer [name] has experienced multiple falls when performing transfers.  Currently, Narya is only able to tolerate 1 hour in her manual wheelchair due to pain and pressure in her back and buttocks.  Jennifer [name] is not able to perform effective pressure relief and must perform multiple transfers wheelchair <> bed each day for pressure relief, pain relief and LE edema management.  Jennifer [name] would benefit from a Group 2 power wheelchair with power tilt for more effective edema management and pressure relief to minimize the number of transfers she must perform which will lower her falls risk.  She will also benefit from power mobility for joint and energy conservation with daily functional mobility and to provide her with greater independence with MRADL's which will allow her to return to meal preparation.         Anterior / Posterior Obliquity Rotation-Pelvis ?????  PELVIS    []  [x]  []   Neutral Posterior Anterior  []  [x]  []   WFL Rt elev Lt elev  []  [x]  []   WFL Right Left                      Anterior    Anterior     []  Fixed []  Other [x]  Partly Flexible []  Flexible   []  Fixed []  Other [x]  Partly Flexible  []  Flexible  []  Fixed []  Other [x]  Partly Flexible  []  Flexible   TRUNK  []  [x]  []   WFL ? Thoracic ? Lumbar  Kyphosis Lordosis   [x]  []  []   WFL Convex Convex  Right Left [] c-curve [] s-curve [] multiple  [x]  Neutral []  Left-anterior []  Right-anterior     [x]  Fixed []  Flexible []  Partly Flexible []  Other  []  Fixed []  Flexible []  Partly Flexible []  Other  []  Fixed             []  Flexible []  Partly Flexible []  Other    Position Windswept  ?????  HIPS          []            [x]               []    Neutral       Abduct        ADduct         [x]           []            []   Neutral Right           Left      []  Fixed []  Subluxed []  Partly Flexible []  Dislocated [x]  Flexible  []  Fixed []  Other []  Partly Flexible  []  Flexible                 Foot Positioning Knee Positioning  L below knee prosthesis    [x]  WFL  [] Lt [x] Rt [x]  WFL  [x] Lt [x] Rt    KNEES ROM concerns: ROM concerns:    & Dorsi-Flexed [] Lt [] Rt ?????    FEET Plantar Flexed [] Lt [] Rt      Inversion                 [] Lt [] Rt      Eversion                 [] Lt [] Rt     HEAD [x]  Functional [x]  Good Head Control  ?????  & []  Flexed         []  Extended []  Adequate Head Control    NECK []  Rotated  Lt  []  Lat Flexed Lt []  Rotated  Rt []  Lat Flexed Rt []  Limited Head Control     []  Cervical Hyperextension []  Absent  Head Control  SHOULDERS ELBOWS WRIST& HAND L tennis elbow      Left     Right    Left     Right    Left     Right   U/E [x] Functional           [] Functional WFL WFL [] Fisting             [] Fisting      [] elev   [] dep      [] elev   [x] dep       [] pro -[] retract     [x] pro  [] retract [] subluxed             [] subluxed           Goals for Wheelchair Mobility  [x]  Independence with mobility in the home with motor related ADLs (MRADLs)  [x]  Independence with MRADLs in the community []  Provide dependent mobility  []  Provide recline     [x] Provide tilt   Goals for Seating system [x]  Optimize pressure distribution [x]  Provide support needed to facilitate function or safety []  Provide corrective forces to assist with maintaining or  improving posture []  Accommodate client's posture:   current seated postures and positions are not flexible or will not tolerate corrective forces [x]  Client to be independent with relieving pressure in the wheelchair [] Enhance physiological function such as breathing, swallowing, digestion  Simulation ideas/Equipment trials:Performed power wheelchair mobility assessment in trial clinic wheelchair with R joystick.  Patient performed mobility around people, furniture and other obstacles safely in moderately crowded environment.  Patient also demonstrated ability to perform 360 degree turns in smaller spaces, back up and park next to mat to prepare for transfers.  Educated patient on turning off power prior to transferring and how to flip up foot plate for transfer.  Patient able to return demonstrate. State why other equipment was unsuccessful:?????   MOBILITY BASE RECOMMENDATIONS and JUSTIFICATION: MOBILITY COMPONENT JUSTIFICATION  Manufacturer: PrideModel: J4 2 SP-SS   Size: Width 20Seat Depth 18 [x] provide transport from point A to B      [x] promote Indep mobility  [x] is not a safe, functional ambulator [x] walker or cane inadequate [] non-standard width/depth necessary to accommodate anatomical measurement []  ?????  [] Manual Mobility Base [] non-functional ambulator    [] Scooter/POV  [] can safely operate  [] can safely transfer   [] has adequate trunk stability  [] cannot functionally propel manual w/c  [x] Power Mobility Base  [x] non-ambulatory  [x] cannot functionally propel manual wheelchair  [x]  cannot functionally and safely operate scooter/POV [x] can safely operate and willing to  [] Stroller Base [] infant/child  [] unable to propel manual wheelchair [] allows for growth [] non-functional ambulator [] non-functional UE [] Indep mobility is not a goal at this time  [x] Tilt  [] Forward [x] Backward [x] Powered tilt  [] Manual tilt  [] change position against gravitational force on head and  shoulders  [] change position for pressure relief/cannot weight shift [] transfers  [] management of tone [] rest periods [] control edema [] facilitate postural control  []  ?????  [] Recline  [] Power recline on power base [] Manual recline on manual base  [] accommodate femur to back angle  [] bring to full recline for ADL care  [] change position for pressure relief/cannot weight shift [] rest periods [] repositioning for transfers or clothing/diaper /catheter changes [] head positioning  [] Lighter weight required [] self- propulsion  [] lifting []  ?????  [] Heavy Duty required [] user weight greater than 250# [] extreme tone/ over active movement [] broken frame on previous chair []  ?????  []  Back  []  Angle Adjustable []  Custom molded ????? [] postural control [] control of tone/spasticity [] accommodation of range of motion []   UE functional control [] accommodation for seating system []  ????? [] provide lateral trunk support [] accommodate deformity [] provide posterior trunk support [] provide lumbar/sacral support [] support trunk in midline [] Pressure relief over spinal processes  [x]  Seat Cushion Stealth Zen SP [] impaired sensation  [] decubitus ulcers present [] history of pressure ulceration [x] prevent pelvic extension [x] low maintenance  [x] stabilize pelvis  [x] accommodate obliquity [] accommodate multiple deformity [x] neutralize lower extremity position [x] increase pressure distribution []  ?????  []  Pelvic/thigh support  []  Lateral thigh guide []  Distal medial pad  []  Distal lateral pad []  pelvis in neutral [] accommodate pelvis []  position upper legs []  alignment []  accommodate ROM []  decr adduction [] accommodate tone [] removable for transfers [] decr abduction  []  Lateral trunk Supports []  Lt     []  Rt [] decrease lateral trunk leaning [] control tone [] contour for increased contact [] safety  [] accommodate asymmetry []  ?????  [x]  Mounting hardware  [] lateral trunk supports  [] back    [] seat [x] headrest      []  thigh support [] fixed   [] swing away [] attach seat platform/cushion to w/c frame [] attach back cushion to w/c frame [] mount postural supports [x] mount headrest  [] swing medial thigh support away [] swing lateral supports away for transfers  []  ?????    Armrests  [] fixed [x] adjustable height [] removable   [] swing away  [x] flip back   [] reclining [x] full length pads [] desk    [] pads tubular  [x] provide support with elbow at 90   [] provide support for w/c tray [x] change of height/angles for variable activities [x] remove for transfers []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

## 2021-05-22 ENCOUNTER — Telehealth: Payer: Self-pay | Admitting: *Deleted

## 2021-05-22 NOTE — Telephone Encounter (Signed)
Per the last Referral that was placed see notes below:  Type Date User Summary Attachment  General 09/17/2020 1:05 PM Lunsford, Alcide Evener - -  Note   Referral greater than 6 months, new referral required          . Type Date User Summary Attachment  General 04/16/2020 10:47 AM Zola Button - -  Note   Plan Name [employer] Dual Complete (HMO-POS D-S Referral The member's plan does not require a referral for network specialty care.  [facility] in [place], [place] Address: [address], [place], [place] [address]  Phone: [phone]         . Type Date User Summary Attachment  Provider Comments 04/12/2020 10:13 AM Bartholomew Crews, MD Provider Comments -  Note   Stuttering since 2021. Had as child then outgrew.

## 2021-05-22 NOTE — Telephone Encounter (Signed)
Pt is requesting start over with Speech Therapy for her stuttering.  Pt prev had a Referral in 2021 placed but because it is well over 6 month's, per prev message from [facility] she will have to have a new order placed.  Please advise if an appointment is needed in order to have a new Referral placed for this patient.

## 2021-05-23 NOTE — Telephone Encounter (Signed)
Pt calling back in reference to her referral to Speech therapy.

## 2021-05-29 ENCOUNTER — Other Ambulatory Visit: Payer: Self-pay | Admitting: Internal Medicine

## 2021-05-29 ENCOUNTER — Telehealth: Payer: Self-pay | Admitting: *Deleted

## 2021-05-29 NOTE — Telephone Encounter (Signed)
Received fax from [employer] requesting OV notes addressing need for powered mobility. Will forward to FO to assist patient in scheduling first available appt with PCP to discuss. This may be done by tele.

## 2021-05-30 ENCOUNTER — Telehealth: Payer: Self-pay | Admitting: *Deleted

## 2021-05-30 NOTE — Telephone Encounter (Signed)
Return pt's call - stated she was informed by [employer] that her insurance co will no longer pay for Benazepril. On 3 way call w/pt (her request), and insurance co who statedi t is covered but too soon for a refill. Pt stated she does not need a refill at this time. Pt wants to know if marijuana has been legalized in [place] - told her I did not know.

## 2021-05-30 NOTE — Telephone Encounter (Signed)
Please call patient at [phone] regarding pharmacy told her they got a letter from insurance company that they will no longer pay for the blood pressure medicine. What should she do now per patient?

## 2021-06-05 ENCOUNTER — Other Ambulatory Visit: Payer: Self-pay | Admitting: Student in an Organized Health Care Education/Training Program

## 2021-06-05 ENCOUNTER — Other Ambulatory Visit: Payer: Self-pay

## 2021-06-05 DIAGNOSIS — IMO0002 Reserved for concepts with insufficient information to code with codable children: Secondary | ICD-10-CM

## 2021-06-05 MED ORDER — OXYCODONE-ACETAMINOPHEN 5-325 MG PO TABS: 1.0000 | ORAL_TABLET | ORAL | 0 refills | Status: DC

## 2021-06-05 NOTE — Telephone Encounter (Signed)
oxyCODONE-acetaminophen (PERCOCET/ROXICET) 5-325 MG tablet, REFILL REQUEST @  [id number] - [place], [place] - [address] Phone:  [phone]  Fax:  [phone]

## 2021-06-05 NOTE — Telephone Encounter (Signed)
Last rx written 05/07/21. Last OV 04/26/21. Next OV 07/11/21. UDS 06/16/19.

## 2021-06-11 NOTE — Telephone Encounter (Signed)
Patient called in stating her insurance told her she doesn't need an OV to discuss need for power w/c. Document from [employer] located in Media explaining patient does need OV noted discussing need for powered mobility. Left detailed message on patient's self-identified VM with this information and requested call back to schedule appt with available provider sooner than PCP visit on 7/21.

## 2021-06-13 ENCOUNTER — Encounter: Payer: Medicare Other | Admitting: Dietician

## 2021-06-13 ENCOUNTER — Encounter: Payer: Medicare Other | Admitting: Internal Medicine

## 2021-06-14 ENCOUNTER — Observation Stay (HOSPITAL_COMMUNITY)
Admission: EM | Admit: 2021-06-14 | Discharge: 2021-06-15 | Disposition: A | Discharge status: Home / Self Care | Payer: Medicare Other | Attending: Internal Medicine | Admitting: Internal Medicine

## 2021-06-14 ENCOUNTER — Encounter (HOSPITAL_COMMUNITY): Payer: Self-pay

## 2021-06-14 ENCOUNTER — Other Ambulatory Visit: Payer: Self-pay

## 2021-06-14 ENCOUNTER — Observation Stay (HOSPITAL_COMMUNITY): Payer: Medicare Other

## 2021-06-14 ENCOUNTER — Telehealth: Payer: Self-pay | Admitting: Internal Medicine

## 2021-06-14 DIAGNOSIS — Z20822 Contact with and (suspected) exposure to covid-19: Secondary | ICD-10-CM | POA: Insufficient documentation

## 2021-06-14 DIAGNOSIS — E11621 Type 2 diabetes mellitus with foot ulcer: Secondary | ICD-10-CM | POA: Diagnosis present

## 2021-06-14 DIAGNOSIS — J449 Chronic obstructive pulmonary disease, unspecified: Secondary | ICD-10-CM | POA: Insufficient documentation

## 2021-06-14 DIAGNOSIS — T783XXA Angioneurotic edema, initial encounter: Principal | ICD-10-CM

## 2021-06-14 DIAGNOSIS — Z794 Long term (current) use of insulin: Secondary | ICD-10-CM | POA: Insufficient documentation

## 2021-06-14 DIAGNOSIS — T464X5A Adverse effect of angiotensin-converting-enzyme inhibitors, initial encounter: Secondary | ICD-10-CM | POA: Insufficient documentation

## 2021-06-14 DIAGNOSIS — I499 Cardiac arrhythmia, unspecified: Secondary | ICD-10-CM | POA: Insufficient documentation

## 2021-06-14 DIAGNOSIS — F17210 Nicotine dependence, cigarettes, uncomplicated: Secondary | ICD-10-CM | POA: Insufficient documentation

## 2021-06-14 DIAGNOSIS — N183 Chronic kidney disease, stage 3 unspecified: Secondary | ICD-10-CM | POA: Insufficient documentation

## 2021-06-14 DIAGNOSIS — N179 Acute kidney failure, unspecified: Secondary | ICD-10-CM | POA: Insufficient documentation

## 2021-06-14 DIAGNOSIS — I130 Hypertensive heart and chronic kidney disease with heart failure and stage 1 through stage 4 chronic kidney disease, or unspecified chronic kidney disease: Secondary | ICD-10-CM | POA: Diagnosis not present

## 2021-06-14 DIAGNOSIS — I509 Heart failure, unspecified: Secondary | ICD-10-CM

## 2021-06-14 DIAGNOSIS — D696 Thrombocytopenia, unspecified: Secondary | ICD-10-CM | POA: Diagnosis not present

## 2021-06-14 DIAGNOSIS — Z7984 Long term (current) use of oral hypoglycemic drugs: Secondary | ICD-10-CM | POA: Diagnosis not present

## 2021-06-14 DIAGNOSIS — Z79899 Other long term (current) drug therapy: Secondary | ICD-10-CM | POA: Diagnosis not present

## 2021-06-14 DIAGNOSIS — E119 Type 2 diabetes mellitus without complications: Secondary | ICD-10-CM | POA: Diagnosis present

## 2021-06-14 DIAGNOSIS — E1165 Type 2 diabetes mellitus with hyperglycemia: Secondary | ICD-10-CM | POA: Diagnosis present

## 2021-06-14 DIAGNOSIS — Z96611 Presence of right artificial shoulder joint: Secondary | ICD-10-CM | POA: Insufficient documentation

## 2021-06-14 DIAGNOSIS — Z7982 Long term (current) use of aspirin: Secondary | ICD-10-CM | POA: Insufficient documentation

## 2021-06-14 DIAGNOSIS — Z7901 Long term (current) use of anticoagulants: Secondary | ICD-10-CM | POA: Diagnosis not present

## 2021-06-14 DIAGNOSIS — I5032 Chronic diastolic (congestive) heart failure: Secondary | ICD-10-CM | POA: Diagnosis not present

## 2021-06-14 DIAGNOSIS — E1122 Type 2 diabetes mellitus with diabetic chronic kidney disease: Secondary | ICD-10-CM | POA: Insufficient documentation

## 2021-06-14 DIAGNOSIS — E1159 Type 2 diabetes mellitus with other circulatory complications: Secondary | ICD-10-CM | POA: Diagnosis present

## 2021-06-14 DIAGNOSIS — I10 Essential (primary) hypertension: Secondary | ICD-10-CM | POA: Diagnosis present

## 2021-06-14 DIAGNOSIS — E118 Type 2 diabetes mellitus with unspecified complications: Secondary | ICD-10-CM | POA: Diagnosis present

## 2021-06-14 HISTORY — DX: Angioneurotic edema, initial encounter: T78.3XXA

## 2021-06-14 LAB — CBC WITH DIFFERENTIAL/PLATELET
Abs Immature Granulocytes: 0.01 10*3/uL (ref 0.00–0.07)
Basophils Absolute: 0 10*3/uL (ref 0.0–0.1)
Basophils Relative: 0 %
Eosinophils Absolute: 0 10*3/uL (ref 0.0–0.5)
Eosinophils Relative: 0 %
HCT: 38.1 % (ref 36.0–46.0)
Hemoglobin: 12.6 g/dL (ref 12.0–15.0)
Immature Granulocytes: 0 %
Lymphocytes Relative: 34 %
Lymphs Abs: 1.9 10*3/uL (ref 0.7–4.0)
MCH: 32.5 pg (ref 26.0–34.0)
MCHC: 33.1 g/dL (ref 30.0–36.0)
MCV: 98.2 fL (ref 80.0–100.0)
Monocytes Absolute: 0.4 10*3/uL (ref 0.1–1.0)
Monocytes Relative: 7 %
Neutro Abs: 3.3 10*3/uL (ref 1.7–7.7)
Neutrophils Relative %: 59 %
Platelets: 118 10*3/uL — ABNORMAL LOW (ref 150–400)
RBC: 3.88 MIL/uL (ref 3.87–5.11)
RDW: 15.1 % (ref 11.5–15.5)
WBC: 5.5 10*3/uL (ref 4.0–10.5)
nRBC: 0 % (ref 0.0–0.2)

## 2021-06-14 LAB — HEPATIC FUNCTION PANEL
ALT: 19 U/L (ref 0–44)
AST: 22 U/L (ref 15–41)
Albumin: 2.8 g/dL — ABNORMAL LOW (ref 3.5–5.0)
Alkaline Phosphatase: 54 U/L (ref 38–126)
Bilirubin, Direct: 0.1 mg/dL (ref 0.0–0.2)
Indirect Bilirubin: 0.1 mg/dL — ABNORMAL LOW (ref 0.3–0.9)
Total Bilirubin: 0.2 mg/dL — ABNORMAL LOW (ref 0.3–1.2)
Total Protein: 6.1 g/dL — ABNORMAL LOW (ref 6.5–8.1)

## 2021-06-14 LAB — BASIC METABOLIC PANEL
Anion gap: 7 (ref 5–15)
BUN: 17 mg/dL (ref 8–23)
CO2: 24 mmol/L (ref 22–32)
Calcium: 9 mg/dL (ref 8.9–10.3)
Chloride: 111 mmol/L (ref 98–111)
Creatinine, Ser: 1.35 mg/dL — ABNORMAL HIGH (ref 0.44–1.00)
GFR, Estimated: 44 mL/min — ABNORMAL LOW (ref >60)
Glucose, Bld: 79 mg/dL (ref 70–99)
Potassium: 3.8 mmol/L (ref 3.5–5.1)
Sodium: 142 mmol/L (ref 135–145)

## 2021-06-14 LAB — CBC
HCT: 37.8 % (ref 36.0–46.0)
Hemoglobin: 12.4 g/dL (ref 12.0–15.0)
MCH: 32 pg (ref 26.0–34.0)
MCHC: 32.8 g/dL (ref 30.0–36.0)
MCV: 97.7 fL (ref 80.0–100.0)
Platelets: 120 10*3/uL — ABNORMAL LOW (ref 150–400)
RBC: 3.87 MIL/uL (ref 3.87–5.11)
RDW: 15.2 % (ref 11.5–15.5)
WBC: 4.6 10*3/uL (ref 4.0–10.5)
nRBC: 0 % (ref 0.0–0.2)

## 2021-06-14 LAB — CREATININE, SERUM
Creatinine, Ser: 1.28 mg/dL — ABNORMAL HIGH (ref 0.44–1.00)
GFR, Estimated: 47 mL/min — ABNORMAL LOW (ref >60)

## 2021-06-14 LAB — GLUCOSE, CAPILLARY: Glucose-Capillary: 182 mg/dL — ABNORMAL HIGH (ref 70–99)

## 2021-06-14 LAB — RETICULOCYTES
Immature Retic Fract: 13.8 % (ref 2.3–15.9)
RBC.: 3.83 MIL/uL — ABNORMAL LOW (ref 3.87–5.11)
Retic Count, Absolute: 64 10*3/uL (ref 19.0–186.0)
Retic Ct Pct: 1.7 % (ref 0.4–3.1)

## 2021-06-14 LAB — PROTIME-INR
INR: 1.1 (ref 0.8–1.2)
Prothrombin Time: 14.3 seconds (ref 11.4–15.2)

## 2021-06-14 LAB — BRAIN NATRIURETIC PEPTIDE: B Natriuretic Peptide: 118.2 pg/mL — ABNORMAL HIGH (ref 0.0–100.0)

## 2021-06-14 LAB — MAGNESIUM: Magnesium: 1.4 mg/dL — ABNORMAL LOW (ref 1.7–2.4)

## 2021-06-14 LAB — RESP PANEL BY RT-PCR (FLU A&B, COVID) ARPGX2
Influenza A by PCR: NEGATIVE
Influenza B by PCR: NEGATIVE
SARS Coronavirus 2 by RT PCR: NEGATIVE

## 2021-06-14 LAB — PHOSPHORUS: Phosphorus: 3.4 mg/dL (ref 2.5–4.6)

## 2021-06-14 LAB — APTT: aPTT: 28 seconds (ref 24–36)

## 2021-06-14 MED ORDER — METOPROLOL SUCCINATE ER 25 MG PO TB24
50.0000 mg | ORAL_TABLET | Freq: Every day | ORAL | Status: DC
  Administered 2021-06-15: 50 mg via ORAL
  Filled 2021-06-14: qty 2

## 2021-06-14 MED ORDER — DARIFENACIN HYDROBROMIDE ER 7.5 MG PO TB24
7.5000 mg | ORAL_TABLET | Freq: Every day | ORAL | Status: DC
  Administered 2021-06-15: 7.5 mg via ORAL
  Filled 2021-06-14: qty 1

## 2021-06-14 MED ORDER — OXYCODONE-ACETAMINOPHEN 5-325 MG PO TABS
2.0000 | ORAL_TABLET | Freq: Three times a day (TID) | ORAL | Status: DC
  Administered 2021-06-15: 2 via ORAL
  Filled 2021-06-14: qty 2

## 2021-06-14 MED ORDER — PANTOPRAZOLE SODIUM 40 MG PO TBEC
40.0000 mg | DELAYED_RELEASE_TABLET | Freq: Every day | ORAL | Status: DC
  Administered 2021-06-15: 40 mg via ORAL
  Filled 2021-06-14: qty 1

## 2021-06-14 MED ORDER — ROSUVASTATIN CALCIUM 20 MG PO TABS
20.0000 mg | ORAL_TABLET | Freq: Every day | ORAL | Status: DC
  Administered 2021-06-15: 20 mg via ORAL
  Filled 2021-06-14: qty 1

## 2021-06-14 MED ORDER — LIDOCAINE HCL URETHRAL/MUCOSAL 2 % EX GEL
1.0000 "application " | Freq: Once | CUTANEOUS | Status: AC
  Administered 2021-06-14: 1 via TOPICAL
  Filled 2021-06-14: qty 11

## 2021-06-14 MED ORDER — INSULIN ASPART 100 UNIT/ML IJ SOLN
0.0000 [IU] | INTRAMUSCULAR | Status: DC
  Administered 2021-06-14 – 2021-06-15 (×2): 3 [IU] via SUBCUTANEOUS
  Administered 2021-06-15: 5 [IU] via SUBCUTANEOUS
  Administered 2021-06-15: 3 [IU] via SUBCUTANEOUS

## 2021-06-14 MED ORDER — ENOXAPARIN SODIUM 40 MG/0.4ML IJ SOSY
40.0000 mg | PREFILLED_SYRINGE | INTRAMUSCULAR | Status: DC
  Administered 2021-06-14: 40 mg via SUBCUTANEOUS
  Filled 2021-06-14: qty 0.4

## 2021-06-14 MED ORDER — TRAZODONE HCL 100 MG PO TABS: 100.0000 mg | ORAL_TABLET | Freq: Every evening | ORAL | Status: DC | PRN

## 2021-06-14 MED ORDER — LIDOCAINE HCL (PF) 4 % IJ SOLN
5.0000 mL | Freq: Once | INTRAMUSCULAR | Status: AC
  Administered 2021-06-14: 5 mL via RESPIRATORY_TRACT
  Filled 2021-06-14: qty 5

## 2021-06-14 MED ORDER — FAMOTIDINE IN NACL 20-0.9 MG/50ML-% IV SOLN
20.0000 mg | Freq: Once | INTRAVENOUS | Status: AC
  Administered 2021-06-14: 20 mg via INTRAVENOUS
  Filled 2021-06-14: qty 50

## 2021-06-14 MED ORDER — ACETAMINOPHEN 650 MG RE SUPP: 650.0000 mg | Freq: Four times a day (QID) | RECTAL | Status: DC | PRN

## 2021-06-14 MED ORDER — TRANEXAMIC ACID-NACL 1000-0.7 MG/100ML-% IV SOLN
1000.0000 mg | INTRAVENOUS | Status: AC
  Administered 2021-06-14: 1000 mg via INTRAVENOUS
  Filled 2021-06-14: qty 100

## 2021-06-14 MED ORDER — ALBUTEROL SULFATE HFA 108 (90 BASE) MCG/ACT IN AERS
2.0000 | INHALATION_SPRAY | Freq: Four times a day (QID) | RESPIRATORY_TRACT | Status: DC | PRN
  Filled 2021-06-14: qty 6.7

## 2021-06-14 MED ORDER — OXYMETAZOLINE HCL 0.05 % NA SOLN
1.0000 | Freq: Once | NASAL | Status: AC
  Administered 2021-06-14: 1 via NASAL
  Filled 2021-06-14: qty 30

## 2021-06-14 MED ORDER — FLUTICASONE PROPIONATE 50 MCG/ACT NA SUSP
1.0000 | Freq: Every day | NASAL | Status: DC | PRN
  Filled 2021-06-14: qty 16

## 2021-06-14 MED ORDER — PREGABALIN 100 MG PO CAPS
200.0000 mg | ORAL_CAPSULE | Freq: Three times a day (TID) | ORAL | Status: DC
  Administered 2021-06-15: 200 mg via ORAL
  Filled 2021-06-14: qty 2

## 2021-06-14 MED ORDER — ONDANSETRON HCL 4 MG PO TABS: 4.0000 mg | ORAL_TABLET | Freq: Four times a day (QID) | ORAL | Status: DC | PRN

## 2021-06-14 MED ORDER — NICOTINE 7 MG/24HR TD PT24
7.0000 mg | MEDICATED_PATCH | Freq: Every day | TRANSDERMAL | Status: DC
  Filled 2021-06-14 (×3): qty 1

## 2021-06-14 MED ORDER — MIRABEGRON ER 50 MG PO TB24
50.0000 mg | ORAL_TABLET | Freq: Every morning | ORAL | Status: DC
  Administered 2021-06-15: 50 mg via ORAL
  Filled 2021-06-14: qty 1

## 2021-06-14 MED ORDER — UMECLIDINIUM-VILANTEROL 62.5-25 MCG/INH IN AEPB
1.0000 | INHALATION_SPRAY | Freq: Every day | RESPIRATORY_TRACT | Status: DC
  Filled 2021-06-14: qty 14

## 2021-06-14 MED ORDER — DIPHENHYDRAMINE HCL 50 MG/ML IJ SOLN
25.0000 mg | Freq: Once | INTRAMUSCULAR | Status: AC
  Administered 2021-06-14: 25 mg via INTRAVENOUS
  Filled 2021-06-14: qty 1

## 2021-06-14 MED ORDER — METHYLPREDNISOLONE SODIUM SUCC 125 MG IJ SOLR
125.0000 mg | Freq: Once | INTRAMUSCULAR | Status: AC
  Administered 2021-06-14: 125 mg via INTRAVENOUS
  Filled 2021-06-14: qty 2

## 2021-06-14 MED ORDER — BACLOFEN 10 MG PO TABS
10.0000 mg | ORAL_TABLET | Freq: Two times a day (BID) | ORAL | Status: DC | PRN
  Filled 2021-06-14: qty 1

## 2021-06-14 MED ORDER — ONDANSETRON HCL 4 MG/2ML IJ SOLN: 4.0000 mg | Freq: Four times a day (QID) | INTRAMUSCULAR | Status: DC | PRN

## 2021-06-14 MED ORDER — TOPIRAMATE 25 MG PO TABS
50.0000 mg | ORAL_TABLET | Freq: Two times a day (BID) | ORAL | Status: DC
  Administered 2021-06-15: 50 mg via ORAL
  Filled 2021-06-14: qty 2

## 2021-06-14 MED ORDER — AMLODIPINE BESYLATE 5 MG PO TABS
10.0000 mg | ORAL_TABLET | Freq: Every day | ORAL | Status: DC
  Administered 2021-06-15: 10 mg via ORAL
  Filled 2021-06-14: qty 2

## 2021-06-14 MED ORDER — ACETAMINOPHEN 325 MG PO TABS: 650.0000 mg | ORAL_TABLET | Freq: Four times a day (QID) | ORAL | Status: DC | PRN

## 2021-06-14 MED ORDER — ASPIRIN EC 81 MG PO TBEC
81.0000 mg | DELAYED_RELEASE_TABLET | Freq: Every day | ORAL | Status: DC
  Administered 2021-06-15: 81 mg via ORAL
  Filled 2021-06-14: qty 1

## 2021-06-14 MED ORDER — BUPROPION HCL ER (XL) 300 MG PO TB24
300.0000 mg | ORAL_TABLET | Freq: Every day | ORAL | Status: DC
  Administered 2021-06-15: 300 mg via ORAL
  Filled 2021-06-14: qty 1

## 2021-06-14 MED ORDER — POLYETHYLENE GLYCOL 3350 17 G PO PACK: 17.0000 g | PACK | Freq: Every day | ORAL | Status: DC | PRN

## 2021-06-14 MED ORDER — MAGNESIUM SULFATE 2 GM/50ML IV SOLN
2.0000 g | Freq: Once | INTRAVENOUS | Status: AC
  Administered 2021-06-14: 2 g via INTRAVENOUS
  Filled 2021-06-14: qty 50

## 2021-06-14 NOTE — ED Notes (Signed)
Medications at the bedside for EDR
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
## 2021-06-14 NOTE — Progress Notes (Signed)
RRT called by ED MD for bedside bronch to be set up for and unsedated pt in ED. Glidescope scope with slim blade, lubricant, jelly lidocain with cotton tips, placed at bedside. Lidocain nebulizer given as ordered. Intubation supplies prepared if needed for any complications. Risks of procedure discussed with RT, pt, RN and MD.
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
## 2021-06-14 NOTE — ED Provider Notes (Addendum)
  Physical Exam  BP 127/90   Pulse 85   Resp 18   SpO2 100%   Physical Exam  ED Course/Procedures     .Critical Care  Date/Time: 06/14/2021 7:20 PM Performed by: Varney Biles, MD Authorized by: Varney Biles, MD   Critical care provider statement:    Critical care time (minutes):  36   Critical care was necessary to treat or prevent imminent or life-threatening deterioration of the following conditions: ANGIOEDEMA.   Critical care was time spent personally by me on the following activities:  Discussions with consultants, evaluation of patient's response to treatment, examination of patient, ordering and performing treatments and interventions, ordering and review of laboratory studies, ordering and review of radiographic studies, pulse oximetry, re-evaluation of patient's condition, obtaining history from patient or surrogate and review of old charts  MDM   Assuming care from Dr. Regenia Skeeter. Patient has history of hypertension on ACE inhibitor.  She woke up this morning with swelling to her tongue.  She was advised to come to the ER by her PCP.  Patient is noted to have edematous tongue secondary to ACE induced angioedema.  Patient is able to speak coherently and not drooling but does indicate some dysphagia.  She has received initial battery of antihistamines and steroid given the amount of swelling.  We are adding TXA.  At this time she is stable, not rapidly progressing given the timeline.  Will reassess around 4 PM and if she has not improved remarkably then consider admission.  6:38 PM Pt reassessed at 4, 5 and 6 pm. Swelling remains, but she is not worsening. She feels alright. No drooling, stridor. Consented to have [place] take a look at her posterior airway for edema - if it is neg might consider d/c.    Varney Biles, MD 06/14/21 1839  7:19 PM Given her comorbidities - we will admit her given the persistent swelling. Will scope if it impacts her disposition location.    Varney Biles, MD 06/14/21 1919    Varney Biles, MD 06/14/21 Lurena Nida

## 2021-06-14 NOTE — Telephone Encounter (Signed)
After-Hours Telephone Call:   Patient contacted the after-hours line. She states she has developed sudden tongue swelling this morning that continues to worsen. She is having trouble talking and swallowing but no current difficulty with breathing.    Assessment/Plan: - Patient advised to immediately contact 911 to be taken to an emergency room. Given patient's symptoms are worsening, she is at high risk for respiratory compromise.

## 2021-06-14 NOTE — Hospital Course (Addendum)
9:30 -10 am  Benadryl, steroids, TXA   Admitted 06/14/2021  Allergies: Benazepril, Abilify [aripiprazole], Iohexol, Ivp dye [iodinated diagnostic agents], and Morphine sulfate Pertinent Hx: HFpEF, COPD, Diabetic, DVT (03/11/2013), Fatty liver (2003), Fibromyalgia, GERD, Glaucoma, HLD, HTN, PVD with claudication, BKA, and T2DM.  60 y.o. female p/w tongue swelling:  * Angioedema  * HFpEF   Consults: none  Meds: ***  VTE ppx:   IVF: {NAMES:3044014::"None","NS","1/2 NS","LR","D5","D10"}  Diet: {NAMES:3044014::"Regular","HH","CM","Renal","NPO","TPN","TF"}     PFT's 05/2007 : FEV1/FVC 82, FEV1 64% pred, FEF 25-75% 40% predicted, 16% improvement in FEV1 with bronchodilators.     Asa 81 Amlodine 10 wellbutirin   Current Meds  Medication Sig   albuterol (PROAIR HFA) 108 (90 Base) MCG/ACT inhaler INHALE 2 PUFFS BY MOUTH EVERY 6 HOURS AS NEEDED FOR WHEEZING (Patient taking differently: Inhale 2 puffs into the lungs every 6 (six) hours as needed for wheezing or shortness of breath.)   amLODipine (NORVASC) 10 MG tablet Take 1 tablet (10 mg total) by mouth daily. (Patient taking differently: Take 10 mg by mouth in the morning.)   aspirin EC 81 MG tablet Take 1 tablet (81 mg total) by mouth daily. (Patient taking differently: Take 81 mg by mouth in the morning.)   baclofen (LIORESAL) 10 MG tablet Take 1 tab as needed for muscle spasms up to twice a day (Patient taking differently: Take 10 mg by mouth 2 (two) times daily as needed for muscle spasms.)   buPROPion (WELLBUTRIN XL) 300 MG 24 hr tablet Take 1 tablet (300 mg total) by mouth daily. (Patient taking differently: Take 300 mg by mouth in the morning.)   Cholecalciferol (VITAMIN D) 50 MCG (2000 UT) CAPS Take 1 capsule (2,000 Units total) by mouth daily. (Patient taking differently: Take 2,000 Units by mouth in the morning.)   diclofenac Sodium (VOLTAREN) 1 % GEL Apply 2 g topically 4 (four) times daily. For the L elbow painful area.    fluticasone (FLONASE) 50 MCG/ACT nasal spray Place 1 spray into both nostrils daily as needed for allergies or rhinitis.   insulin degludec (TRESIBA FLEXTOUCH) 200 UNIT/ML FlexTouch Pen Inject 70 Units into the skin daily before breakfast.   insulin lispro (HUMALOG KWIKPEN) 100 UNIT/ML KwikPen IF BLOOD SUGAR IS < 175 DO NOT TAKE ANY CORRECTION INSULIN. 176-225 INJECT 2 UNITS. 226-275 INJECT 4 UNITS. 169-678 INJECT 6 UNITS. 326-375 INJECT 8 UNITS. 938-101 INJECT 10 UNITS. 426-475 INJECT 12 UNITS. 751-025 INJECT 14 UNITS AND CALL OFFICE FOR FURTHER INSTRUCTIONS. (Patient taking differently: Inject 5-45 Units into the skin 3 (three) times daily before meals. Sliding scale based on CBG)   metFORMIN (GLUCOPHAGE) 500 MG tablet Take 1 tablet (500 mg total) by mouth 2 (two) times daily.   metoprolol succinate (TOPROL XL) 50 MG 24 hr tablet Take 1 tablet (50 mg total) by mouth daily. Take with or immediately following a meal. (Patient taking differently: Take 50 mg by mouth daily with breakfast. Take with or immediately following a meal.)   mirabegron ER (MYRBETRIQ) 50 MG TB24 tablet Take 50 mg by mouth in the morning.   nystatin (NYSTATIN) powder Apply 1 application topically 3 (three) times daily.   omeprazole (PRILOSEC) 20 MG capsule Take 2 capsules (40 mg total) by mouth daily. (Patient taking differently: Take 40 mg by mouth in the morning.)   oxyCODONE-acetaminophen (PERCOCET/ROXICET) 5-325 MG tablet Take 1 tablet by mouth See admin instructions for 100 doses. Take one tablet by mouth every 8 hours as needed for severe pain; may  take a 4th tablet up to 10 days/month (#100 equals 30 day supply) (Patient taking differently: Take 2 tablets by mouth 3 (three) times daily. scheduled)   OZEMPIC, 0.25 OR 0.5 MG/DOSE, 2 MG/1.5ML SOPN Inject 0.5 mg into the skin every Monday for 24 doses.   Prednisol Ace-Moxiflox-Bromfen 1-0.5-0.075 % SUSP Place 1 drop into the left eye 4 (four) times daily.   pregabalin (LYRICA) 200  MG capsule TAKE 1 CAPSULE BY MOUTH THREE TIMES A DAY (Patient taking differently: Take 200 mg by mouth 3 (three) times daily.)   promethazine (PHENERGAN) 25 MG tablet Take 1 tablet (25 mg total) by mouth every 6 (six) hours as needed for nausea or vomiting.   rosuvastatin (CRESTOR) 20 MG tablet Take 1 tablet (20 mg total) by mouth at bedtime. (Patient taking differently: Take 20 mg by mouth in the morning.)   solifenacin (VESICARE) 5 MG tablet Take 5 mg by mouth in the morning.   topiramate (TOPAMAX) 50 MG tablet TAKE 1 TABLET BY MOUTH TWICE A DAY (Patient taking differently: Take 50 mg by mouth 2 (two) times daily.)   traZODone (DESYREL) 100 MG tablet Take 100 mg by mouth at bedtime as needed for sleep.   umeclidinium-vilanterol (ANORO ELLIPTA) 62.5-25 MCG/INH AEPB Inhale 1 puff into the lungs daily. (Patient taking differently: Inhale 1 puff into the lungs in the morning.)    Diet Orders (From admission, onward)     Start     Ordered   06/14/21 1431  Diet NPO time specified  Diet effective now        06/14/21 1430

## 2021-06-14 NOTE — ED Triage Notes (Signed)
Pt BIB [facility] from home, pt reports waking this am with burning under her tongue. Gradual swelling to her tongue and bottom lip. At one point pt states she was unable to keep her tongue in her mouth. Presents with moderate edema. Airway intact speaking complete sentences, no SOB  Pt takes Benazepril    CBG 103  BP 140/84 HR 90

## 2021-06-14 NOTE — H&P (Addendum)
Date: 06/14/2021               Patient Name:  Jennifer [name] MRN: [id number]  DOB: 01-Mar-1961 Age / Sex: 60 y.o., female   PCP: Angelica Pou, MD         Medical Service: Internal Medicine Teaching Service         Attending Physician: Dr. Lucious Groves, DO    First Contact: Dr. [name] Pager: [phone]  Second Contact: Dr. Charleen Kirks Pager: [phone]       After Hours (After 5p/  First Contact Pager: [phone]  weekends / holidays): Second Contact Pager: [phone]   Chief Complaint: Angioedema   History of Present Illness:  Karaline Buresh is 60 yo female with PMH of HTN, T2DM, left BKA, PAD, depression, who presented to the ED for swelling of her tongue.  Limited history was obtained due to patient's drowsiness.  Patient states that she woke up this morning feeling fine.  She ate breakfast with eggs and pancakes and then took her medications.  She then developed tongue swelling after with difficulty swallowing.  She denies chest pain, shortness of breath, abdominal pain, dysuria or issue with BM.  Denies any skin changes, hives, extremity swelling, pruritus.  States that she never had this issue in the past.   She reports taking her medication as instructed.  She misses her doses sometimes.  Say that her mom may had similar issue in the past but she is unsure.    In the ED, patient was normotensive, afebrile when first arrived to ED.  She was put on nasal cannula 5L.  CBC without leukocytosis.  BMP showed an AKI with creatinine 1.35.  She has received Benadryl, Pepcid, Solu-Medrol and TXA in the ED.  Meds:  Current Meds  Medication Sig   albuterol (PROAIR HFA) 108 (90 Base) MCG/ACT inhaler INHALE 2 PUFFS BY MOUTH EVERY 6 HOURS AS NEEDED FOR WHEEZING (Patient taking differently: Inhale 2 puffs into the lungs every 6 (six) hours as needed for wheezing or shortness of breath.)   amLODipine (NORVASC) 10 MG tablet Take 1 tablet (10 mg total) by mouth daily. (Patient taking  differently: Take 10 mg by mouth in the morning.)   aspirin EC 81 MG tablet Take 1 tablet (81 mg total) by mouth daily. (Patient taking differently: Take 81 mg by mouth in the morning.)   baclofen (LIORESAL) 10 MG tablet Take 1 tab as needed for muscle spasms up to twice a day (Patient taking differently: Take 10 mg by mouth 2 (two) times daily as needed for muscle spasms.)   buPROPion (WELLBUTRIN XL) 300 MG 24 hr tablet Take 1 tablet (300 mg total) by mouth daily. (Patient taking differently: Take 300 mg by mouth in the morning.)   Cholecalciferol (VITAMIN D) 50 MCG (2000 UT) CAPS Take 1 capsule (2,000 Units total) by mouth daily. (Patient taking differently: Take 2,000 Units by mouth in the morning.)   diclofenac Sodium (VOLTAREN) 1 % GEL Apply 2 g topically 4 (four) times daily. For the L elbow painful area.   fluticasone (FLONASE) 50 MCG/ACT nasal spray Place 1 spray into both nostrils daily as needed for allergies or rhinitis.   insulin degludec (TRESIBA FLEXTOUCH) 200 UNIT/ML FlexTouch Pen Inject 70 Units into the skin daily before breakfast.   insulin lispro (HUMALOG KWIKPEN) 100 UNIT/ML KwikPen IF BLOOD SUGAR IS < 175 DO NOT TAKE ANY CORRECTION INSULIN. 176-225 INJECT 2 UNITS. 226-275 INJECT 4 UNITS. 948-546 INJECT  6 UNITS. 326-375 INJECT 8 UNITS. 196-222 INJECT 10 UNITS. 426-475 INJECT 12 UNITS. 979-892 INJECT 14 UNITS AND CALL OFFICE FOR FURTHER INSTRUCTIONS. (Patient taking differently: Inject 5-45 Units into the skin 3 (three) times daily before meals. Sliding scale based on CBG)   metFORMIN (GLUCOPHAGE) 500 MG tablet Take 1 tablet (500 mg total) by mouth 2 (two) times daily.   metoprolol succinate (TOPROL XL) 50 MG 24 hr tablet Take 1 tablet (50 mg total) by mouth daily. Take with or immediately following a meal. (Patient taking differently: Take 50 mg by mouth daily with breakfast. Take with or immediately following a meal.)   mirabegron ER (MYRBETRIQ) 50 MG TB24 tablet Take 50 mg by mouth  in the morning.   nystatin (NYSTATIN) powder Apply 1 application topically 3 (three) times daily.   omeprazole (PRILOSEC) 20 MG capsule Take 2 capsules (40 mg total) by mouth daily. (Patient taking differently: Take 40 mg by mouth in the morning.)   oxyCODONE-acetaminophen (PERCOCET/ROXICET) 5-325 MG tablet Take 1 tablet by mouth See admin instructions for 100 doses. Take one tablet by mouth every 8 hours as needed for severe pain; may take a 4th tablet up to 10 days/month (#100 equals 30 day supply) (Patient taking differently: Take 2 tablets by mouth 3 (three) times daily. scheduled)   OZEMPIC, 0.25 OR 0.5 MG/DOSE, 2 MG/1.5ML SOPN Inject 0.5 mg into the skin every Monday for 24 doses.   Prednisol Ace-Moxiflox-Bromfen 1-0.5-0.075 % SUSP Place 1 drop into the left eye 4 (four) times daily.   pregabalin (LYRICA) 200 MG capsule TAKE 1 CAPSULE BY MOUTH THREE TIMES A DAY (Patient taking differently: Take 200 mg by mouth 3 (three) times daily.)   promethazine (PHENERGAN) 25 MG tablet Take 1 tablet (25 mg total) by mouth every 6 (six) hours as needed for nausea or vomiting.   rosuvastatin (CRESTOR) 20 MG tablet Take 1 tablet (20 mg total) by mouth at bedtime. (Patient taking differently: Take 20 mg by mouth in the morning.)   solifenacin (VESICARE) 5 MG tablet Take 5 mg by mouth in the morning.   topiramate (TOPAMAX) 50 MG tablet TAKE 1 TABLET BY MOUTH TWICE A DAY (Patient taking differently: Take 50 mg by mouth 2 (two) times daily.)   traZODone (DESYREL) 100 MG tablet Take 100 mg by mouth at bedtime as needed for sleep.   umeclidinium-vilanterol (ANORO ELLIPTA) 62.5-25 MCG/INH AEPB Inhale 1 puff into the lungs daily. (Patient taking differently: Inhale 1 puff into the lungs in the morning.)     Allergies: Allergies as of 06/14/2021 - Review Complete 06/14/2021  Allergen Reaction Noted   Benazepril Swelling 06/14/2021   Abilify [aripiprazole] Other (See Comments) 11/09/2015   Iohexol Other (See  Comments) 08/17/2007   Ivp dye [iodinated diagnostic agents] Other (See Comments) 05/10/2013   Morphine sulfate Itching and Rash    Past Medical History:  Diagnosis Date   Cataract    Chronic bronchitis ([place])    "I get it alot" (09/28/2013)   Chronic diastolic heart failure (HCC)    grade 2 per 2D echocardiogram (01/2013)   Chronic lower back pain    Chronic pain syndrome 12/03/2011   Likely secondary to depression, "fibromyalgia", neuropathy, and obesity. Lumbar MRI 2014 no sig change from prior (2008) : Stable hypertrophic facet disease most notable at L4-5. Stable shallow left foraminal/extraforaminal disc protrusion at L4-5. No direct neural compression.       COPD 01/08/2007   PFT's 05/2007 : FEV1/FVC 82, FEV1 64% pred,  FEF 25-75% 40% predicted, 16% improvement in FEV1 with bronchodilators.      Diabetic peripheral neuropathy (HCC)    DVT of upper extremity (deep vein thrombosis) ([place]) 03/11/2013   Secondary to PICC line. Right brachial vein, diagnosed on 03/10/2013 Coumadin for 3 months. 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    History of use of hearing aid    Hyperlipidemia    Hyperplastic colon polyp 12/2010   Per colonoscopy (12/2010) - Dr. 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 2/2 tobacco use, DM, and cont weight bearing on surgical wound and developed gangrene     Tobacco abuse    Type II diabetes mellitus with peripheral circulatory disorders, uncontrolled DX: 1993   Insulin dep. Poor control. Complicated by diabetic foot ulcer and diabetic eye disease.      Family History:  Family History  Problem Relation Age of Onset   Diverticulosis Mother    Diabetes Mother    Hypertension Mother    Congestive Heart Failure Mother    Asthma Father    CAD Sister 69       MI at age 56 per patient.  However, she has not had a stent or CABG.    Heart disease Sister        before age 67   Breast cancer Neg Hx     Mother: also has swelling issue in the past  Social History:  Denies alcohol Smoke 1/4 pack a day for many years Denies illicit drug use  Review of Systems: A complete ROS was negative except as per HPI.   Physical Exam: Blood pressure (!) 145/75, pulse 87, temperature 98.2 F (36.8 C), temperature source Oral, resp. rate (!) 21, SpO2 96 %. Physical Exam Constitutional:      General: She is not in acute distress.    Appearance: She is obese. She is not ill-appearing.     Comments: Patient is drowsy but responds to verbal stimulation.  She falls back to sleep multiple times during the interview.  Patient is protecting her airway, no acute respiratory distress, able to swallow saliva, no stridor, no drooling.  HENT:     Head: Normocephalic and atraumatic.     Mouth/Throat:     Mouth: Mucous membranes are moist.     Pharynx: No oropharyngeal exudate or posterior oropharyngeal erythema.     Comments: Swollen tongue and lips Eyes:     General: No scleral icterus.       Right eye: No discharge.     Conjunctiva/sclera: Conjunctivae normal.  Neck:     Comments: No cervical adenopathy.  Mild swelling of the exterior neck, could be due to her body habitus. Cardiovascular:     Rate and Rhythm: Normal rate and regular rhythm.     Heart sounds: Normal heart sounds. No murmur  heard.    Comments: +1 right lower extremity edema Pulmonary:      Effort: Pulmonary effort is normal. No respiratory distress.     Breath sounds: No wheezing.     Comments: Unable to auscultate posterior lungs Abdominal:     General: Bowel sounds are normal. There is no distension.     Palpations: Abdomen is soft.     Tenderness: There is no abdominal tenderness. There is no guarding.  Musculoskeletal:     Cervical back: Normal range of motion.     Comments: Left BKA  Skin:    General: Skin is warm.     Coloration: Skin is not jaundiced.     Comments: No rash, urticaria, erythema  Neurological:     Mental Status: She is oriented to person, place, and time.  Psychiatric:        Mood and Affect: Mood normal.        Thought Content: Thought content normal.        Judgment: Judgment normal.     EKG: personally reviewed my interpretation is sinus rhythm   Assessment & Plan by Problem: Principal Problem:   Angioedema Active Problems:   Diabetes mellitus type 2, uncontrolled, with complications (HCC)   Hypertension associated with diabetes ([place])   Chronic diastolic heart failure (HCC)  Tanda Morrissey is 60 yo female with PMH of HTN, T2DM, left BKA, PAD, depression, who presented to the ED for swelling of her tongue, concerns for bradykinin induced angioedema secondary to ACE inhibitor.  Patient currently protecting her airway, no indication for intubation at this time.  Angioedema Most likely bradykinin induced angioedema secondary to her benazepril.  No evidence of allergic reaction or anaphylactic reaction.  Patient is protecting her airway, no sign of acute respiratory distress, able to swallow saliva, no stridor, no drooling, no trismus.  No indication for intubation at this time.  We will check C4, C1 esterase inhibitor, and C1q to rule out acquired C1 esterase inh deficiency vs hereditary due to history of similar presentation in family.  -Stop benazepril. Added on allergy list. -NPO -Can consider getting ESR/CRP to rule out infection or  malignancy -Monitor closely.  On progressive floor.  May need intubation if can protect airway. -SLP eval in AM   HFpEF Recent echo 02/25/2021 showed normal EF with grade 1 diastolic function.  Patient appears volume overloaded on exam with +2 LE edema and JVD.  Currently on 4 L oxygen, patient is not on oxygen at home.  Patient has stopped her diuretics due to urinary incontinence.   -Will check BNP and CXR before giving IV lasix.  AKI Likely prerenal due to no p.o. intake today.  Avoid IV fluid due to history of HFpEF and overload volume status. -Monitor BMP  Thrombocytopenia This is a chronic issue since March 2022.  Unclear cause.   -Will check liver function, PT, INR.  Type 2 diabetes mellitus Last A1c of 8 3 months ago.  Regimen includes 70 units of Tresiba daily with lispro sliding scale. -Q4h sliding scale at this time due to n.p.o. status  Hypertension Holding home p.o. meds.  Blood pressure only slightly elevated. -Can resume if pass SLP  Full code IVF: N/A Diet: N.p.o. DVT: Lovenox  Dispo: Admit patient to Observation with expected length of stay less than 2 midnights.  Signed: Gaylan Gerold, DO 06/14/2021, 9:23 PM  Pager: [phone] After 5pm on weekdays and 1pm on weekends: On Call pager: [phone] No

## 2021-06-15 DIAGNOSIS — T783XXA Angioneurotic edema, initial encounter: Secondary | ICD-10-CM | POA: Diagnosis not present

## 2021-06-15 DIAGNOSIS — I5032 Chronic diastolic (congestive) heart failure: Secondary | ICD-10-CM | POA: Diagnosis not present

## 2021-06-15 DIAGNOSIS — E1165 Type 2 diabetes mellitus with hyperglycemia: Secondary | ICD-10-CM | POA: Diagnosis not present

## 2021-06-15 DIAGNOSIS — J449 Chronic obstructive pulmonary disease, unspecified: Secondary | ICD-10-CM | POA: Diagnosis not present

## 2021-06-15 DIAGNOSIS — E118 Type 2 diabetes mellitus with unspecified complications: Secondary | ICD-10-CM | POA: Diagnosis not present

## 2021-06-15 DIAGNOSIS — Z20822 Contact with and (suspected) exposure to covid-19: Secondary | ICD-10-CM | POA: Diagnosis not present

## 2021-06-15 LAB — COMPREHENSIVE METABOLIC PANEL
ALT: 19 U/L (ref 0–44)
AST: 16 U/L (ref 15–41)
Albumin: 2.8 g/dL — ABNORMAL LOW (ref 3.5–5.0)
Alkaline Phosphatase: 54 U/L (ref 38–126)
Anion gap: 9 (ref 5–15)
BUN: 17 mg/dL (ref 8–23)
CO2: 19 mmol/L — ABNORMAL LOW (ref 22–32)
Calcium: 9.2 mg/dL (ref 8.9–10.3)
Chloride: 111 mmol/L (ref 98–111)
Creatinine, Ser: 1.18 mg/dL — ABNORMAL HIGH (ref 0.44–1.00)
GFR, Estimated: 52 mL/min — ABNORMAL LOW (ref >60)
Glucose, Bld: 188 mg/dL — ABNORMAL HIGH (ref 70–99)
Potassium: 4.1 mmol/L (ref 3.5–5.1)
Sodium: 139 mmol/L (ref 135–145)
Total Bilirubin: 0.4 mg/dL (ref 0.3–1.2)
Total Protein: 6 g/dL — ABNORMAL LOW (ref 6.5–8.1)

## 2021-06-15 LAB — CBC
HCT: 37.9 % (ref 36.0–46.0)
Hemoglobin: 12.5 g/dL (ref 12.0–15.0)
MCH: 31.6 pg (ref 26.0–34.0)
MCHC: 33 g/dL (ref 30.0–36.0)
MCV: 95.9 fL (ref 80.0–100.0)
Platelets: 112 10*3/uL — ABNORMAL LOW (ref 150–400)
RBC: 3.95 MIL/uL (ref 3.87–5.11)
RDW: 14.9 % (ref 11.5–15.5)
WBC: 4.2 10*3/uL (ref 4.0–10.5)
nRBC: 0 % (ref 0.0–0.2)

## 2021-06-15 LAB — GLUCOSE, CAPILLARY
Glucose-Capillary: 164 mg/dL — ABNORMAL HIGH (ref 70–99)
Glucose-Capillary: 168 mg/dL — ABNORMAL HIGH (ref 70–99)
Glucose-Capillary: 223 mg/dL — ABNORMAL HIGH (ref 70–99)

## 2021-06-15 LAB — IMMATURE PLATELET FRACTION: Immature Platelet Fraction: 11.3 % — ABNORMAL HIGH (ref 1.2–8.6)

## 2021-06-15 MED ORDER — METOPROLOL SUCCINATE ER 100 MG PO TB24: 100.0000 mg | ORAL_TABLET | Freq: Every day | ORAL | Status: DC

## 2021-06-15 MED ORDER — METOPROLOL SUCCINATE ER 100 MG PO TB24: 100.0000 mg | ORAL_TABLET | Freq: Every day | ORAL | 0 refills | Status: DC

## 2021-06-15 MED ORDER — METOPROLOL SUCCINATE ER 50 MG PO TB24
50.0000 mg | ORAL_TABLET | Freq: Once | ORAL | Status: AC
  Administered 2021-06-15: 50 mg via ORAL
  Filled 2021-06-15: qty 1

## 2021-06-15 NOTE — Discharge Summary (Signed)
Name: Jennifer [name] MRN: [id number] DOB: [birth year] 60 y.o. PCP: Angelica Pou, MD  Date of Admission: 06/14/2021  2:10 PM Date of Discharge: 06/15/2021 Attending Physician: Lucious Groves, DO  Discharge Diagnosis: 1. Bradykinin-Induced Angioedema  2. Sinus Arrhythmia   Discharge Medications: Allergies as of 06/15/2021       Reactions   Benazepril Swelling   Possible tongue swelling 06/14/2021   Abilify [aripiprazole] Other (See Comments)   Urinary freq Nov 2016   Iohexol Other (See Comments)   IV CONTRAST CAUSED TRANSIENT NEPHROPATHY (KIDNEY INSUFFICIENCY) IN 2007   Ivp Dye [iodinated Diagnostic Agents] Other (See Comments)   IV CONTRAST CAUSED TRANSIENT NEPHROPATHY (KIDNEY INSUFFICIENCY) IN 2007   Morphine Sulfate Itching, Rash        Medication List     STOP taking these medications    benazepril 40 MG tablet Commonly known as: LOTENSIN       TAKE these medications    amLODipine 10 MG tablet Commonly known as: NORVASC Take 1 tablet (10 mg total) by mouth daily. What changed: when to take this   diclofenac Sodium 1 % Gel Commonly known as: Voltaren Apply 2 g topically 4 (four) times daily. For the L elbow painful area.   fluticasone 50 MCG/ACT nasal spray Commonly known as: FLONASE Place 1 spray into both nostrils daily as needed for allergies or rhinitis.   Insulin Pen Needle 32G X 4 MM Misc Use to inject insulin 4 times a day and semaglutide once weekly   Insulin Syringe-Needle U-100 31G X 15/64" 0.3 ML Misc Use with Humalog to correct blood sugar before meals three times a day   metFORMIN 500 MG tablet Commonly known as: GLUCOPHAGE Take 1 tablet (500 mg total) by mouth 2 (two) times daily.   metoprolol succinate 100 MG 24 hr tablet Commonly known as: TOPROL-XL Take 1 tablet (100 mg total) by mouth daily. Take with or immediately following a meal. Start taking on: June 16, 2021 What changed:  medication strength how much to take    mirabegron ER 50 MG Tb24 tablet Commonly known as: MYRBETRIQ Take 50 mg by mouth in the morning.   nystatin powder Commonly known as: nystatin Apply 1 application topically 3 (three) times daily.   onetouch ultrasoft lancets Use as instructed   OneTouch Verio Flex System w/Device Kit Check 4 times a day   OneTouch Verio test strip Generic drug: glucose blood 1 each by Other route as needed for other. Use as instructed. Tests 3-4 times daily.   Ozempic (0.25 or 0.5 MG/DOSE) 2 MG/1.5ML Sopn Generic drug: Semaglutide(0.25 or 0.5MG/DOS) Inject 0.5 mg into the skin every Monday for 24 doses.   Prednisol Ace-Moxiflox-Bromfen 1-0.5-0.075 % Susp Place 1 drop into the left eye 4 (four) times daily.   promethazine 25 MG tablet Commonly known as: PHENERGAN Take 1 tablet (25 mg total) by mouth every 6 (six) hours as needed for nausea or vomiting.   solifenacin 5 MG tablet Commonly known as: VESICARE Take 5 mg by mouth in the morning.   traZODone 100 MG tablet Commonly known as: DESYREL Take 100 mg by mouth at bedtime as needed for sleep.   [name] FlexTouch 200 UNIT/ML FlexTouch Pen Generic drug: insulin degludec Inject 70 Units into the skin daily before breakfast.       ASK your doctor about these medications    albuterol 108 (90 Base) MCG/ACT inhaler Commonly known as: ProAir HFA INHALE 2 PUFFS BY MOUTH EVERY 6 HOURS  AS NEEDED FOR WHEEZING   Anoro Ellipta 62.5-25 MCG/INH Aepb Generic drug: umeclidinium-vilanterol Inhale 1 puff into the lungs daily.   aspirin EC 81 MG tablet Take 1 tablet (81 mg total) by mouth daily.   baclofen 10 MG tablet Commonly known as: LIORESAL Take 1 tab as needed for muscle spasms up to twice a day   buPROPion 300 MG 24 hr tablet Commonly known as: WELLBUTRIN XL Take 1 tablet (300 mg total) by mouth daily.   insulin lispro 100 UNIT/ML KwikPen Commonly known as: HumaLOG KwikPen IF BLOOD SUGAR IS < 175 DO NOT TAKE ANY CORRECTION  INSULIN. 176-225 INJECT 2 UNITS. 226-275 INJECT 4 UNITS. 397-673 INJECT 6 UNITS. 326-375 INJECT 8 UNITS. 419-379 INJECT 10 UNITS. 426-475 INJECT 12 UNITS. 024-097 INJECT 14 UNITS AND CALL OFFICE FOR FURTHER INSTRUCTIONS.   omeprazole 20 MG capsule Commonly known as: PRILOSEC Take 2 capsules (40 mg total) by mouth daily.   oxyCODONE-acetaminophen 5-325 MG tablet Commonly known as: PERCOCET/ROXICET Take 1 tablet by mouth See admin instructions for 100 doses. Take one tablet by mouth every 8 hours as needed for severe pain; may take a 4th tablet up to 10 days/month (#100 equals 30 day supply)   pregabalin 200 MG capsule Commonly known as: LYRICA TAKE 1 CAPSULE BY MOUTH THREE TIMES A DAY   rosuvastatin 20 MG tablet Commonly known as: CRESTOR Take 1 tablet (20 mg total) by mouth at bedtime.   topiramate 50 MG tablet Commonly known as: TOPAMAX TAKE 1 TABLET BY MOUTH TWICE A DAY   Vitamin D 50 MCG (2000 UT) Caps Take 1 capsule (2,000 Units total) by mouth daily.        Disposition and follow-up:   Jennifer [name] was discharged from [facility] in Good condition.  At the hospital follow up visit please address:  1.    Please obtain repeat EKG. On day of discharge, patient developed a sinus arrhythmia, although asymptomatic. Metoprolol was subsequently increased from 50 to 100 mg daily.    Please adjust blood pressure medications given Benazepril was discontinued.    Patient noted to be hypervolemic on admission;please assess fluid status. BNP at baseline.   Creatinine above baseline, however no AKI. Please assess renal function at follow up.  2.  Labs / imaging needed at time of follow-up: BMP  3.  Pending labs/ test needing follow-up: Complement c1q, C1 esterase inhibitor, C4 complement   Hospital Course by problem list: 1. Bradykinin-Induced Angioedema:  Patient presented to the ED yesterday after developing sudden onset tongue swelling and difficulty  swallowing.  On arrival to the ED, she was noted to have an edematous tongue, however she was able to speak coherently and was protecting her airway; due to this, intubation was not pursued.  She received IV Benadryl, IV Pepcid, Solu-Medrol and TXA.  Given persistence of swelling, IMTS was consulted for admission.  Patient swelling resolved over the next several hours and on day of discharge, patient denied any tongue swelling, difficulty breathing, or difficulty swallowing.  Benazepril was discontinued and ACE inhibitors were added to allergy list.  2. Sinus Arrhythmia:  On day of discharge, patient was noted to have a irregularly irregular rhythm on telemetry and examination.  EKG was obtained that demonstrated a sinus arrhythmia although no evidence of atrial fibrillation or atrial flutter.  Most likely sinus arrhythmia versus frequent PACs.  Patient is asymptomatic.  Metoprolol was increased from 50 mg daily to 100 mg daily.  Recommend outpatient  follow-up with PCP.  Subjective:  Mr. Zartman is resting in bed comfortably. She endorses a headache that is a chronic problem for her. She is concerned it is from her blood pressure. Additionally, she is worried about her heart rate, as she was told it is too high. Denies any palpitations at this time.   Discharge Exam:   BP 125/69 (BP Location: Right Arm)   Pulse (!) 101   Temp 98.3 F (36.8 C) (Oral)   Resp 18   Ht _0  (1.727 m)   Wt 109.3 kg   SpO2 95%   BMI 36.64 kg/m   Constitutional: appears older than stated age, resting comfortably. sitting up in bed, in no acute distress HENT: normocephalic atraumatic, mucous membranes moist Eyes: conjunctiva non-erythematous Neck: supple Cardiovascular: irregular irregular rhythm, rate ~95 during examiantion no m/r/g Pulmonary/Chest: normal work of breathing on room air, lungs clear to auscultation bilaterally Abdominal: soft, non-tender, non-distended MSK: normal bulk and tone Neurological: alert  & oriented Skin: warm and dry Psych: normal mood and effect  Pertinent Labs, Studies, and Procedures:  CBC Latest Ref Rng & Units 06/15/2021 06/14/2021 06/14/2021  WBC 4.0 - 10.5 K/uL 4.2 4.6 5.5  Hemoglobin 12.0 - 15.0 g/dL 12.5 12.4 12.6  Hematocrit 36.0 - 46.0 % 37.9 37.8 38.1  Platelets 150 - 400 K/uL 112(L) 120(L) 118(L)   BMP Latest Ref Rng & Units 06/15/2021 06/14/2021 06/14/2021  Glucose 70 - 99 mg/dL 188(H) - 79  BUN 8 - 23 mg/dL 17 - 17  Creatinine 0.44 - 1.00 mg/dL 1.18(H) 1.28(H) 1.35(H)  BUN/Creat Ratio 12 - 28 - - -  Sodium 135 - 145 mmol/L 139 - 142  Potassium 3.5 - 5.1 mmol/L 4.1 - 3.8  Chloride 98 - 111 mmol/L 111 - 111  CO2 22 - 32 mmol/L 19(L) - 24  Calcium 8.9 - 10.3 mg/dL 9.2 - 9.0   BNP    Component Value Date/Time   BNP 118.2 (H) 06/14/2021 2120   CXR:  IMPRESSION: No active cardiopulmonary disease.  Discharge Instructions: Discharge Instructions     Call MD for:  difficulty breathing, headache or visual disturbances   Complete by: As directed    Call MD for:  extreme fatigue   Complete by: As directed    Call MD for:  persistant dizziness or light-headedness   Complete by: As directed    Call MD for:  persistant nausea and vomiting   Complete by: As directed    Call MD for:  severe uncontrolled pain   Complete by: As directed    Call MD for:  temperature >100.4   Complete by: As directed    Diet - low sodium heart healthy   Complete by: As directed    Discharge instructions   Complete by: As directed    Jennifer [name], Jennifer [name] were admitted to the hospital after developing an allergic reaction to one of your medications.  While here, you received multiple medications to help bring down the swelling.  I am happy that the swelling resolved completely.  We also noted that your heart rate was a bit fast. Due to this, we increased your medication called Metoprolol from 50 mg to 100 mg. The new prescription is at your pharmacy.   Please make sure to  follow up with your primary care doctor within the next 1 week.   - Dr. Charleen Kirks   Increase activity slowly   Complete by: As directed  Signed: Dr. Jose Persia Internal Medicine PGY-2  Pager: [phone] After 5pm on weekdays and 1pm on weekends: On Call pager [phone]  06/15/2021, 2:08 PM

## 2021-06-15 NOTE — Evaluation (Signed)
Clinical/Bedside Swallow Evaluation Patient Details  Name: -Jennifer [name] MRN: [id number] Date of Birth: May 31, 1961  Today's Date: 06/15/2021 Time: SLP Start Time (ACUTE ONLY): 0808 SLP Stop Time (ACUTE ONLY): 0932 SLP Time Calculation (min) (ACUTE ONLY): 24 min  Past Medical History:  Past Medical History:  Diagnosis Date   Cataract    Chronic bronchitis ([place])    "I get it alot" (09/28/2013)   Chronic diastolic heart failure (HCC)    grade 2 per 2D echocardiogram (01/2013)   Chronic lower back pain    Chronic pain syndrome 12/03/2011   Likely secondary to depression, "fibromyalgia", neuropathy, and obesity. Lumbar MRI 2014 no sig change from prior (2008) : Stable hypertrophic facet disease most notable at L4-5. Stable shallow left foraminal/extraforaminal disc protrusion at L4-5. No direct neural compression.       COPD 01/08/2007   PFT's 05/2007 : FEV1/FVC 82, FEV1 64% pred, FEF 25-75% 40% predicted, 16% improvement in FEV1 with bronchodilators.      Diabetic peripheral neuropathy (HCC)    DVT of upper extremity (deep vein thrombosis) ([place]) 03/11/2013   Secondary to PICC line. Right brachial vein, diagnosed on 03/10/2013 Coumadin for 3 months. 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    History of use of hearing aid    Hyperlipidemia    Hyperplastic colon polyp 12/2010   Per colonoscopy (12/2010) - Dr. 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 2/2 tobacco use, DM, and cont weight bearing on surgical wound and developed gangrene    Tobacco abuse    Type II diabetes mellitus with peripheral circulatory disorders, uncontrolled DX: 1993   Insulin dep. Poor control. Complicated by diabetic foot ulcer and diabetic eye disease.     Past Surgical History:  Past Surgical History:  Procedure Laterality Date   ABDOMINAL HYSTERECTOMY  1997   secondary to uterine fibroids   AMPUTATION Left 08/31/2013   Procedure: AMPUTATION RAY;  Surgeon: Newt Minion, MD;  Location: [place];  Service: Orthopedics;  Laterality: Left;  Left Foot 5th Ray Amputation   AMPUTATION Left 09/28/2013   Procedure: Left Midfoot amputation;  Surgeon: Newt Minion, MD;  Location: [place];  Service: Orthopedics;  Laterality: Left;  Left Midfoot amputation   AMPUTATION Left 10/14/2013   Procedure: AMPUTATION BELOW KNEE- left;  Surgeon: Newt Minion, MD;  Location: [place];  Service: Orthopedics;  Laterality: Left;  Left Below Knee Amputation    AMPUTATION TOE Right 01/15/2017   Procedure: AMPUTATION 5th TOE RIGHT FOOT;  Surgeon: Edrick Kins, DPM;  Location: [place];  Service: Podiatry;  Laterality: Right;   APPLICATION OF WOUND VAC  04/01/2019   Procedure: Application Of Wound Vac;  Surgeon: Newt Minion, MD;  Location: [place];  Service: Orthopedics;;   BLADDER SURGERY     bladder  reconstruction surgery   BREAST BIOPSY     multiple-benign per Jennifer [name]   COLONOSCOPY     ESOPHAGOGASTRODUODENOSCOPY N/A 09/20/2013   Procedure: ESOPHAGOGASTRODUODENOSCOPY (EGD);  Surgeon: Jerene Bears, MD;  Location: [place];  Service: Gastroenterology;  Laterality: N/A;   FOOT AMPUTATION THROUGH METATARSAL Left 09/28/2013   GANGLION CYST EXCISION     multiple   PERIPHERAL VASCULAR INTERVENTION     stents in lower ext   SHOULDER ARTHROSCOPY Right 11/11/2019   RIGHT SHOULDER ARTHROSCOPY AND DEBRIDEMENT    SHOULDER  ARTHROSCOPY Right 11/11/2019   Procedure: RIGHT SHOULDER ARTHROSCOPY AND DEBRIDEMENT;  Surgeon: Newt Minion, MD;  Location: [place];  Service: Orthopedics;  Laterality: Right;   SHOULDER ARTHROSCOPY W/ ROTATOR CUFF REPAIR Bilateral    SKIN SPLIT GRAFT Bilateral 05/13/2013   Procedure: Right and Left Foot Allograft Skin Graft;  Surgeon: Newt Minion, MD;  Location: [place];  Service: Orthopedics;  Laterality: Bilateral;  Right and Left Foot Allograft Skin Graft   STUMP REVISION Left 04/01/2019   Procedure: REVISION LEFT BELOW KNEE AMPUTATION;  Surgeon: Newt Minion, MD;  Location: [place];  Service: Orthopedics;  Laterality: Left;   TEE WITHOUT CARDIOVERSION N/A 01/31/2013   Procedure: TRANSESOPHAGEAL ECHOCARDIOGRAM (TEE);  Surgeon: Fay Records, MD;  Location: [facility] ENDOSCOPY;  Service: Cardiovascular;  Laterality: N/A;  Rm [id number]   TEE WITHOUT CARDIOVERSION N/A 03/10/2013   Procedure: TRANSESOPHAGEAL ECHOCARDIOGRAM (TEE);  Surgeon: Larey Dresser, MD;  Location: [place];  Service: Cardiovascular;  Laterality: N/A;  Rm. [id number]   TOE AMPUTATION Left 08/31/2013   4TH & 5 TH TOE    TONSILLECTOMY     TUBAL LIGATION     WRIST SURGERY Right    "for tumors" (09/28/2013)   HPI:  Jennifer [name] is a 60yo female presenting with swelling of her tongue (R side) and difficulty swallowing after taking her medications. Angioedema suspected to be secondary to ACE inhibitor. PMH includes: COPD, GERD, tobacco use, HTN, T2DM, left BKA, PAD, depression. Jennifer [name] was previously seen by SLP in January 2018 and March 2022 with regular solids and thin liquids recommended.   Assessment / Plan / Recommendation Clinical Impression   Jennifer [name] reports a significant decrease in edema today, thinking that her tongue and her speech are nearing her baseline. She also says that yesterday she was having trouble managing her saliva, but she denies any trouble with this today. During PO intake there was only a single, delayed cough noted after initial trials  of thin liquids and she reports mild odynophagia, but otherwise her swallowing appears to be functional considering lack of dentition. Recommend starting regular solids and thin liquids. SLP reviewed aspiration precautions and recommended trying to make slightly softer food choices pending reduced pain with swallowing. Will f/u briefly, but do not anticipate any SLP f/u needed upon discharge.  SLP Visit Diagnosis: Dysphagia, unspecified (R13.10)    Aspiration Risk  Mild aspiration risk    Diet Recommendation Regular;Thin liquid   Liquid Administration via: Cup;Straw Medication Administration: Whole meds with liquid Supervision: Patient able to self feed;Intermittent supervision to cue for compensatory strategies Compensations: Slow rate;Small sips/bites Postural Changes: Seated upright at 90 degrees;Remain upright for at least 30 minutes after po intake    Other  Recommendations Oral Care Recommendations: Oral care BID   Follow up Recommendations None      Frequency and Duration min 1 x/week  1 week       Prognosis Prognosis for Safe Diet Advancement: Good  Swallow Study   General HPI: Jennifer [name] is a 60yo female presenting with swelling of her tongue (R side) and difficulty swallowing after taking her medications. Angioedema suspected to be secondary to ACE inhibitor. PMH includes: COPD, GERD, tobacco use, HTN, T2DM, left BKA, PAD, depression. Jennifer [name] was previously seen by SLP in January 2018 and March 2022 with regular solids and thin liquids recommended. Type of Study: Bedside Swallow Evaluation Previous Swallow Assessment: see HPI Diet Prior to this Study: NPO Temperature Spikes Noted: No History of Recent Intubation: No Behavior/Cognition: Alert;Cooperative;Pleasant mood Oral Cavity Assessment: Within Functional Limits Oral Care Completed by SLP: No Oral Cavity - Dentition: Edentulous Vision: Functional for self-feeding Self-Feeding Abilities: Able to feed self Patient  Positioning: Upright in bed Baseline Vocal Quality: Normal Volitional Swallow: Able to elicit    Oral/Motor/Sensory Function Overall Oral Motor/Sensory Function: Within functional limits   Ice Chips Ice chips: Not tested   Thin Liquid Thin Liquid: Impaired Presentation: Cup;Self Fed;Straw Pharyngeal  Phase Impairments: Cough - Delayed    Nectar Thick Nectar Thick Liquid: Not tested   Honey Thick Honey Thick Liquid: Not tested   Puree Puree: Within functional limits Presentation: Self Fed;Spoon   Solid     Solid: Within functional limits Presentation: Self Fed      Osie Bond., M.A. [place] Pager [phone] Office [phone]  06/15/2021,8:34 AM

## 2021-06-15 NOTE — Progress Notes (Signed)
Occupational Therapy Evaluation Patient Details Name: Jennifer [name] MRN: [id number] DOB: August 02, 1961 Today's Date: 06/15/2021    History of Present Illness 60 y.o. female admitted on 06/14/21 for tongue swelling.  Dx with angioedema, AKI, thrombocytopenia.  Pt with significant PMH of chronic diastolic heart failure, COPD, DM2 with diabetic peripheral neuropathy, DVT of UE, fibromyalgia, glaucoma, HTN, juvenile RA, PVD, s/p BKA L with prosthesis, R shoulder surgery, R wrist surgery, and pt reports lumbar spinal stenosis.   Clinical Impression   Prior to admission, pt was living with her son in a 1-level house with 4-5 STE. Pt was mod I with self-care and functional mobility using a rollator inside the house and wheelchair in the community. Pt performed most IADLs with mod I- medication management, laundry, cooking, cleaning. Pt does not drive and uses a transportation service. Pt has been active with HHPT and plans to continue those services.  Today, pt received sitting in chair, pt agreeable to OT eval. Pt presents with intact ROM/strength to all four extremities and prosthetic in place to LLE. Pt required min guard-mod I for all ADLs/ADL mobility using RW for support. No LOB noted, however pt required v/c's for safe RW management during OOB ADLs. Additionally educated pt on activity pacing, adaptive ADL strategies, DME/AE (purchase options for [facility], pt has other DME already), benefits of post acute rehab, and fall prevention. Pt reports that her children can provide PRN S/A as needed upon d/c. Pt plans on continuing HHPT when discharged home. Per care team, pt is discharging this afternoon, therefore OT will sign off. Please re-consult OT if pt remains in-house. Thank you.    Follow Up Recommendations  No OT follow up;Supervision - Intermittent    Equipment Recommendations  3 in 1 bedside commode;Other (comment) (reports having shower chair and RW but broken BSC, advised pt to purchase BSC through  [employer], [place], [place], [employer], or [employer])    Recommendations for Other Services Other (comment) (None)     Precautions / Restrictions Precautions Precautions: Fall Required Braces or Orthoses:  (wears LLE prosthetic)      Mobility Bed Mobility Overal bed mobility: Modified Independent       General bed mobility comments: not assessed, received up in chair    Transfers Overall transfer level: Needs assistance Equipment used: Rolling walker (2 wheeled) Transfers: Sit to/from Stand Sit to Stand: Min guard;Supervision         General transfer comment: using RW, sit>stand from chair and toilet level    Balance Overall balance assessment: Needs assistance Sitting-balance support: No upper extremity supported;Feet supported Sitting balance-Leahy Scale: Normal Sitting balance - Comments: can reach down and adjust her sock and donn R shoe and L prosthesis in sitting EOB without UE support.   Standing balance support: Bilateral upper extremity supported;During functional activity Standing balance-Leahy Scale: Fair Standing balance comment: reliant on RW           ADL either performed or assessed with clinical judgement   ADL Overall ADL's : Needs assistance/impaired Eating/Feeding: Independent;Sitting   Grooming: Wash/dry hands;Supervision/safety;Min guard;Standing Grooming Details (indicate cue type and reason): using RW while standing at sink Upper Body Bathing: Modified independent;Sitting Upper Body Bathing Details (indicate cue type and reason): simulated in chair Lower Body Bathing: Supervison/ safety;Min guard;Sitting/lateral leans;Sit to/from stand Lower Body Bathing Details (indicate cue type and reason): simulated using RW Upper Body Dressing : Modified independent;Sitting Upper Body Dressing Details (indicate cue type and reason): to don shirt Lower Body Dressing: Supervision/safety;Min guard;Sitting/lateral  leans;Sit to/from stand Lower Body Dressing  Details (indicate cue type and reason): for pants, socks, and LLE prosthetic management using RW Toilet Transfer: Min guard;Supervision/safety;Cueing for safety;Ambulation;Regular Toilet;Grab bars;RW   Toileting- Water quality scientist and Hygiene: Min guard;Supervision/safety;Sitting/lateral lean;Sit to/from Sales promotion account executive Details (indicate cue type and reason): for perineal care in standing with RW support Tub/ Banker:  (not assessed) Tub/Shower Transfer Details (indicate cue type and reason): not assessed Functional mobility during ADLs: Supervision/safety;Min guard;Cueing for safety;Rolling walker       Vision Baseline Vision/History: Wears glasses;Cataracts Wears Glasses: Reading only Patient Visual Report: No change from baseline Vision Assessment?: No apparent visual deficits     Perception Perception Perception Tested?: No   Praxis Praxis Praxis tested?: Not tested    Pertinent Vitals/Pain Pain Assessment: 0-10 Pain Score: 8  Pain Location: entire body Pain Descriptors / Indicators: Aching Pain Intervention(s): Monitored during session;Repositioned;Relaxation     Hand Dominance Right   Extremity/Trunk Assessment Upper Extremity Assessment Upper Extremity Assessment: Overall WFL for tasks assessed   Lower Extremity Assessment Lower Extremity Assessment: Defer to PT evaluation LLE Deficits / Details: left leg with BKA, able to donn her own prosthesis (60 years old and according to her still fits--seems a bit large to me with space to fit my hand between her leg and the prosthesis). LE strength limited by spinal stenosis and edurance, but functiona and likely close to baseline. LLE Sensation: history of peripheral neuropathy   Cervical / Trunk Assessment Cervical / Trunk Assessment: Other exceptions Cervical / Trunk Exceptions: Pt reports lumbar spinal stenosis.   Communication Communication Communication: No difficulties    Cognition Arousal/Alertness: Awake/alert Behavior During Therapy: WFL for tasks assessed/performed Overall Cognitive Status: Within Functional Limits for tasks assessed             General Comments  LLE prosthetic, no edema, skin intact               Home Living Family/patient expects to be discharged to:: Private residence Living Arrangements: Other relatives;Children Available Help at Discharge: Family;Available 24 hours/day Type of Home: House Home Access: Stairs to enter- 4-5 STE      Home Layout: One level     Bathroom Shower/Tub: Tub/shower unit;Other (comment)   Bathroom Toilet: Standard Bathroom Accessibility: Yes How Accessible: Accessible via walker Home Equipment: [place] - 2 wheels;Walker - 4 wheels;Shower seat   Additional Comments: mostly uses rollator; walks inside the house with rollator and uses WC outside of house.      Prior Functioning/Environment Level of Independence: Needs assistance  Gait / Transfers Assistance Needed: was active with [facility] (advanced home care per pt report) ADL's / Homemaking Assistance Needed: mod I with ADLs/ADL mobility using rollator/IADLs, has transportation service, independent with meds Communication / Swallowing Assistance Needed: no issues Comments: wears prosthetic throughout home, ind IADLs        OT Problem List: Decreased activity tolerance;Impaired balance (sitting and/or standing);Decreased safety awareness;Pain      OT Treatment/Interventions:      OT Goals(Current goals can be found in the care plan section) Acute Rehab OT Goals Patient Stated Goal: return home and remain independent with ADL routine OT Goal Formulation: With patient Time For Goal Achievement: 06/29/21 Potential to Achieve Goals: Good   AM-PAC OT "6 Clicks" Daily Activity     Outcome Measure Help from another person eating meals?: None Help from another person taking care of personal grooming?: A  Little Help from another person toileting,  which includes using toliet, bedpan, or urinal?: A Little Help from another person bathing (including washing, rinsing, drying)?: A Little Help from another person to put on and taking off regular upper body clothing?: None Help from another person to put on and taking off regular lower body clothing?: A Little 6 Click Score: 20   End of Session Equipment Utilized During Treatment: Gait belt;Rolling walker Nurse Communication: Mobility status  Activity Tolerance: Patient tolerated treatment well Patient left: in chair;with call bell/phone within reach  OT Visit Diagnosis: Unsteadiness on feet (R26.81);Muscle weakness (generalized) (M62.81);Pain Pain - Right/Left:  ("everywhere") Pain - part of body:  ("everywhere")                Time: 6861-6837 OT Time Calculation (min): 29 min Charges:  OT General Charges $OT Visit: 1 Visit OT Evaluation $OT Eval Moderate Complexity: 1 Mod OT Treatments $Self Care/Home Management : 8-22 mins  Michel Bickers, OTR/L [facility] [phone]  Jennifer [name] 06/15/2021, 3:50 PM

## 2021-06-15 NOTE — Evaluation (Signed)
Physical Therapy Evaluation Patient Details Name: Jennifer [name] MRN: [id number] DOB: June 18, 1961 Today's Date: 06/15/2021   History of Present Illness  60 y.o. female admitted on 06/14/21 for tongue swelling.  Dx with angioedema, AKI, thrombocytopenia.  Pt with significant PMH of chronic diastolic heart failure, COPD, DM2 with diabetic peripheral neuropathy, DVT of UE, fibromyalgia, glaucoma, HTN, juvenile RA, PVD, s/p BKA L with prosthesis, R shoulder surgery, R wrist surgery, and pt reports lumbar spinal stenosis.  Clinical Impression  Pt is likely close to her mobility baseline of ambulating household distances with RW.  She has a bariatric rollator (walker with 4 wheels and a seat) at home, so next session, if she is still here we will bring that for hallway gait to simulate home mobility.   PT to follow acutely for deficits listed below.       Follow Up Recommendations Home health PT (resume HHPT with advanced home care, therapist's name is Rande Lawman)    Equipment Recommendations  None recommended by PT    Recommendations for Other Services       Precautions / Restrictions Precautions Precautions: Fall      Mobility  Bed Mobility Overal bed mobility: Modified Independent             General bed mobility comments: HOB elevated and pt using rail.    Transfers Overall transfer level: Needs assistance Equipment used: Rolling walker (2 wheeled) Transfers: Sit to/from Stand Sit to Stand: Min guard         General transfer comment: Min guard for safety as we were using a walker that is not her normal walker (uses a rollator at baseline).  Ambulation/Gait Ambulation/Gait assistance: Min guard Gait Distance (Feet): 20 Feet Assistive device: Rolling walker (2 wheeled) Gait Pattern/deviations: Step-through pattern;Trunk flexed     General Gait Details: Pt leans forward over the RW with feet quite frequently outside of the base. Cues for safety.  Stairs             Wheelchair Mobility    Modified Rankin (Stroke Patients Only)       Balance Overall balance assessment: Needs assistance Sitting-balance support: Feet supported;No upper extremity supported Sitting balance-Leahy Scale: Normal Sitting balance - Comments: can reach down and adjust her sock and donn R shoe and L prosthesis in sitting EOB without UE support.   Standing balance support: Single extremity supported Standing balance-Leahy Scale: Poor Standing balance comment: fairly reliant on at least one with preference of two upper extremities supported for balance in standing.  We bathed, washed hands and brushed her gums at the sink and she leans down on her elbows throughout.                             Pertinent Vitals/Pain Pain Assessment: Faces Pain Descriptors / Indicators: Headache Pain Intervention(s): Limited activity within patient's tolerance;Monitored during session;Repositioned    Home Living Family/patient expects to be discharged to:: Private residence Living Arrangements: Other relatives;Children Available Help at Discharge: Family;Available 24 hours/day Type of Home: House Home Access: Stairs to enter     Home Layout: One level Home Equipment: Bedside commode;Tub bench;Shower seat;Wheelchair - [employer] - 4 wheels;Walker - 2 wheels Additional Comments: mostly uses rollator; walks inside the house with rollator and uses WC outside of house.    Prior Function Level of Independence: Needs assistance   Gait / Transfers Assistance Needed: was active with [facility] (advanced home care  per pt report)     Comments: wears prosthetic throughout home, ind IADLs     Hand Dominance   Dominant Hand: Right    Extremity/Trunk Assessment   Upper Extremity Assessment Upper Extremity Assessment: Defer to OT evaluation    Lower Extremity Assessment Lower Extremity Assessment: LLE deficits/detail LLE Deficits / Details:  left leg with BKA, able to donn her own prosthesis (60 years old and according to her still fits--seems a bit large to me with space to fit my hand between her leg and the prosthesis). LE strength limited by spinal stenosis and edurance, but functiona and likely close to baseline. LLE Sensation: history of peripheral neuropathy    Cervical / Trunk Assessment Cervical / Trunk Assessment: Other exceptions Cervical / Trunk Exceptions: Pt reports lumbar spinal stenosis.  Communication   Communication: No difficulties  Cognition Arousal/Alertness: Awake/alert Behavior During Therapy: WFL for tasks assessed/performed Overall Cognitive Status: Within Functional Limits for tasks assessed                                        General Comments      Exercises     Assessment/Plan    PT Assessment Patient needs continued PT services  PT Problem List Decreased strength;Decreased balance;Decreased mobility;Decreased knowledge of use of DME;Obesity       PT Treatment Interventions DME instruction;Gait training;Therapeutic exercise;Therapeutic activities;Functional mobility training;Stair training;Balance training;Patient/family education;Wheelchair mobility training    PT Goals (Current goals can be found in the Care Plan section)  Acute Rehab PT Goals Patient Stated Goal: to get back home soon, hopefully tomorrow. PT Goal Formulation: With patient Time For Goal Achievement: 06/29/21 Potential to Achieve Goals: Good    Frequency Min 3X/week   Barriers to discharge        Co-evaluation               AM-PAC PT "6 Clicks" Mobility  Outcome Measure Help needed turning from your back to your side while in a flat bed without using bedrails?: A Little Help needed moving from lying on your back to sitting on the side of a flat bed without using bedrails?: A Little Help needed moving to and from a bed to a chair (including a wheelchair)?: A Little Help needed standing  up from a chair using your arms (e.g., wheelchair or bedside chair)?: A Little Help needed to walk in hospital room?: A Little Help needed climbing 3-5 steps with a railing? : A Little 6 Click Score: 18    End of Session   Activity Tolerance: Patient limited by fatigue Patient left: in chair;with call bell/phone within reach;with chair alarm set   PT Visit Diagnosis: Muscle weakness (generalized) (M62.81);Difficulty in walking, not elsewhere classified (R26.2)    Time: 3559-7416 PT Time Calculation (min) (ACUTE ONLY): 38 min   Verdene Lennert, PT, DPT  Acute Rehabilitation Ortho Tech Supervisor [phone] pager [phone]) [phone] office

## 2021-06-15 NOTE — Progress Notes (Signed)
[place] for review of PTA med list for drug interaction   Allergies  Allergen Reactions   Benazepril Swelling    Possible tongue swelling 06/14/2021   Abilify [Aripiprazole] Other (See Comments)    Urinary freq Nov 2016   Iohexol Other (See Comments)    IV CONTRAST CAUSED TRANSIENT NEPHROPATHY (KIDNEY INSUFFICIENCY) IN 2007    Ivp Dye [Iodinated Diagnostic Agents] Other (See Comments)    IV CONTRAST CAUSED TRANSIENT NEPHROPATHY (KIDNEY INSUFFICIENCY) IN 2007   Morphine Sulfate Itching and Rash    Medical History: Past Medical History:  Diagnosis Date   Cataract    Chronic bronchitis ([place])    "I get it alot" (09/28/2013)   Chronic diastolic heart failure (HCC)    grade 2 per 2D echocardiogram (01/2013)   Chronic lower back pain    Chronic pain syndrome 12/03/2011   Likely secondary to depression, "fibromyalgia", neuropathy, and obesity. Lumbar MRI 2014 no sig change from prior (2008) : Stable hypertrophic facet disease most notable at L4-5. Stable shallow left foraminal/extraforaminal disc protrusion at L4-5. No direct neural compression.       COPD 01/08/2007   PFT's 05/2007 : FEV1/FVC 82, FEV1 64% pred, FEF 25-75% 40% predicted, 16% improvement in FEV1 with bronchodilators.      Diabetic peripheral neuropathy (HCC)    DVT of upper extremity (deep vein thrombosis) ([place]) 03/11/2013   Secondary to PICC line. Right brachial vein, diagnosed on 03/10/2013 Coumadin for 3 months. 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    History of use of hearing aid    Hyperlipidemia    Hyperplastic colon polyp 12/2010   Per colonoscopy (12/2010) - Dr. Deatra Ina   Hypertension    Juvenile rheumatoid  arthritis [facility])    Diagnosed age 42; treated initially with "lots of aspirin"   PVD (peripheral vascular disease) with claudication ([place])    Stents to bilateral common iliac arteries (left 2005, right 2008), on chronic plavix   Pyelonephritis 10/28/2020   S/P BKA (below knee amputation) unilateral ([place])    2014 L - failed limb preserving treatment. 2/2 tobacco use, DM, and cont weight bearing on surgical wound and developed gangrene    Tobacco abuse    Type II diabetes mellitus with peripheral circulatory disorders, uncontrolled DX: 1993   Insulin dep. Poor control. Complicated by diabetic foot ulcer and diabetic eye disease.      Medications:  Medications Prior to Admission  Medication Sig Dispense Refill Last Dose   albuterol (PROAIR HFA) 108 (90 Base) MCG/ACT inhaler INHALE 2 PUFFS BY MOUTH EVERY 6 HOURS AS NEEDED FOR WHEEZING (Patient taking differently: Inhale 2 puffs into the lungs every 6 (six) hours as needed for wheezing or shortness of breath.) 54 g 3 06/13/2021   amLODipine (NORVASC) 10 MG tablet Take 1 tablet (10 mg total) by mouth daily. (Patient taking differently: Take 10 mg by mouth in the morning.) 90 tablet 3 06/13/2021 at am   aspirin EC 81 MG tablet Take 1 tablet (81 mg total) by mouth daily. (Patient taking differently: Take 81 mg by mouth in the morning.) 30 tablet  06/13/2021 at am   baclofen (LIORESAL) 10 MG  tablet Take 1 tab as needed for muscle spasms up to twice a day (Patient taking differently: Take 10 mg by mouth 2 (two) times daily as needed for muscle spasms.) 60 tablet 2 week ago   buPROPion (WELLBUTRIN XL) 300 MG 24 hr tablet Take 1 tablet (300 mg total) by mouth daily. (Patient taking differently: Take 300 mg by mouth in the morning.) 90 tablet 1 06/13/2021 at am   Cholecalciferol (VITAMIN D) 50 MCG (2000 UT) CAPS Take 1 capsule (2,000 Units total) by mouth daily. (Patient taking differently: Take 2,000 Units by mouth in the morning.)   06/13/2021 at am   diclofenac  Sodium (VOLTAREN) 1 % GEL Apply 2 g topically 4 (four) times daily. For the L elbow painful area. 50 g 1 06/13/2021   fluticasone (FLONASE) 50 MCG/ACT nasal spray Place 1 spray into both nostrils daily as needed for allergies or rhinitis.   Past Week   insulin degludec (TRESIBA FLEXTOUCH) 200 UNIT/ML FlexTouch Pen Inject 70 Units into the skin daily before breakfast. 27 mL 8 06/14/2021 at am   insulin lispro (HUMALOG KWIKPEN) 100 UNIT/ML KwikPen IF BLOOD SUGAR IS < 175 DO NOT TAKE ANY CORRECTION INSULIN. 176-225 INJECT 2 UNITS. 226-275 INJECT 4 UNITS. 977-414 INJECT 6 UNITS. 326-375 INJECT 8 UNITS. 239-532 INJECT 10 UNITS. 426-475 INJECT 12 UNITS. 023-343 INJECT 14 UNITS AND CALL OFFICE FOR FURTHER INSTRUCTIONS. (Patient taking differently: Inject 5-45 Units into the skin 3 (three) times daily before meals. Sliding scale based on CBG) 9 mL 3 06/14/2021 at am   metFORMIN (GLUCOPHAGE) 500 MG tablet Take 1 tablet (500 mg total) by mouth 2 (two) times daily. 90 tablet 3 06/14/2021 at am   metoprolol succinate (TOPROL XL) 50 MG 24 hr tablet Take 1 tablet (50 mg total) by mouth daily. Take with or immediately following a meal. (Patient taking differently: Take 50 mg by mouth daily with breakfast. Take with or immediately following a meal.) 30 tablet 11 06/13/2021 at morning   mirabegron ER (MYRBETRIQ) 50 MG TB24 tablet Take 50 mg by mouth in the morning.   06/13/2021 at am   nystatin (NYSTATIN) powder Apply 1 application topically 3 (three) times daily. 30 g 2 06/13/2021   omeprazole (PRILOSEC) 20 MG capsule Take 2 capsules (40 mg total) by mouth daily. (Patient taking differently: Take 40 mg by mouth in the morning.) 180 capsule 3 06/13/2021 at am   oxyCODONE-acetaminophen (PERCOCET/ROXICET) 5-325 MG tablet Take 1 tablet by mouth See admin instructions for 100 doses. Take one tablet by mouth every 8 hours as needed for severe pain; may take a 4th tablet up to 10 days/month (#100 equals 30 day supply) (Patient taking  differently: Take 2 tablets by mouth 3 (three) times daily. scheduled) 100 tablet 0 06/13/2021   OZEMPIC, 0.25 OR 0.5 MG/DOSE, 2 MG/1.5ML SOPN Inject 0.5 mg into the skin every Monday for 24 doses. 1.5 mL 5 06/10/2021   Prednisol Ace-Moxiflox-Bromfen 1-0.5-0.075 % SUSP Place 1 drop into the left eye 4 (four) times daily.   06/13/2021   pregabalin (LYRICA) 200 MG capsule TAKE 1 CAPSULE BY MOUTH THREE TIMES A DAY (Patient taking differently: Take 200 mg by mouth 3 (three) times daily.) 90 capsule 2 06/13/2021   promethazine (PHENERGAN) 25 MG tablet Take 1 tablet (25 mg total) by mouth every 6 (six) hours as needed for nausea or vomiting. 30 tablet 0 Past Week   rosuvastatin (CRESTOR) 20 MG tablet Take 1 tablet (20 mg total) by mouth at  bedtime. (Patient taking differently: Take 20 mg by mouth in the morning.) 90 tablet 3 06/13/2021   solifenacin (VESICARE) 5 MG tablet Take 5 mg by mouth in the morning.   06/13/2021   topiramate (TOPAMAX) 50 MG tablet TAKE 1 TABLET BY MOUTH TWICE A DAY (Patient taking differently: Take 50 mg by mouth 2 (two) times daily.) 180 tablet 1 06/13/2021   traZODone (DESYREL) 100 MG tablet Take 100 mg by mouth at bedtime as needed for sleep.   couple nights ago   umeclidinium-vilanterol (ANORO ELLIPTA) 62.5-25 MCG/INH AEPB Inhale 1 puff into the lungs daily. (Patient taking differently: Inhale 1 puff into the lungs in the morning.) 90 each 3 06/13/2021   benazepril (LOTENSIN) 40 MG tablet Take 1 tablet (40 mg total) by mouth daily. (Patient not taking: Reported on 06/14/2021) 90 tablet 3 Not Taking   Blood Glucose Monitoring Suppl (ONETOUCH VERIO FLEX SYSTEM) w/Device KIT Check 4 times a day 1 kit 1    glucose blood (ONETOUCH VERIO) test strip 1 each by Other route as needed for other. Use as instructed. Tests 3-4 times daily. 100 each 3    Insulin Pen Needle 32G X 4 MM MISC Use to inject insulin 4 times a day and semaglutide once weekly 100 each 0    Insulin Syringe-Needle U-100 31G X  15/64" 0.3 ML MISC Use with Humalog to correct blood sugar before meals three times a day 100 each 0    Lancets (ONETOUCH ULTRASOFT) lancets Use as instructed 100 each 12     Assessment/Plan:  Pharmacy completed a thorough review of all of Jennifer [name]'s stated home medications.  No major drug interactions noted that would necessitate immediate medication changes.  Below are some points to consider.   - Trazodone increases bupropion levels which could be problematic at high bupropion doses as this lowers seizure threshold.  However, Jennifer [name] only takes the Trazodone prn for sleep and does not have a hx of seizures.  - Several medications can cause sedation and should be monitored when used concomitantly: Promethazine, Trazodone, Topiramate, Baclofen   Thank you for allowing pharmacy to participate in the care of this patient.  [employer], Pharm.D., BCPS Clinical Pharmacist Clinical phone for 06/15/2021 from 7:30-3:00 is [phone].  **Pharmacist phone directory can be found on [email or web address] listed under [place].  06/15/2021 11:27 AM

## 2021-06-15 NOTE — Care Management (Signed)
[id number] 06-15-21 The patient is currently active with [facility] for PT Services. Case Manager called Liaison to make them aware that the patient will transition home today. Case Manager asked providers for home health order and F2F. Patient states she has transportation home at 1600. No further needs from Case Manager at this time.

## 2021-06-17 LAB — C4 COMPLEMENT: Complement C4, Body Fluid: 47 mg/dL — ABNORMAL HIGH (ref 12–38)

## 2021-06-17 LAB — C1 ESTERASE INHIBITOR: C1INH SerPl-mCnc: 42 mg/dL — ABNORMAL HIGH (ref 21–39)

## 2021-06-21 LAB — COMPLEMENT COMPONENT C1Q: C1q Complement Protein CC1Q: 18.1 mg/dL (ref 10.3–20.5)

## 2021-06-25 ENCOUNTER — Encounter: Payer: Self-pay | Admitting: *Deleted

## 2021-06-26 ENCOUNTER — Other Ambulatory Visit: Payer: Self-pay | Admitting: Internal Medicine

## 2021-06-27 ENCOUNTER — Telehealth: Payer: Self-pay | Admitting: *Deleted

## 2021-06-27 NOTE — Telephone Encounter (Signed)
Beth R, PT with [facility], called in requesting VO for [facility] to assist patient with housing options. Verbal auth given. Will route to PCP for agreement/denial.

## 2021-07-02 ENCOUNTER — Encounter: Payer: Medicare Other | Admitting: Dietician

## 2021-07-02 ENCOUNTER — Telehealth: Payer: Self-pay | Admitting: Dietician

## 2021-07-02 ENCOUNTER — Other Ambulatory Visit: Payer: Self-pay | Admitting: Internal Medicine

## 2021-07-02 DIAGNOSIS — IMO0002 Reserved for concepts with insufficient information to code with codable children: Secondary | ICD-10-CM

## 2021-07-02 DIAGNOSIS — E1165 Type 2 diabetes mellitus with hyperglycemia: Secondary | ICD-10-CM

## 2021-07-02 NOTE — Telephone Encounter (Signed)
Refill Request   oxyCODONE-acetaminophen (PERCOCET/ROXICET) 5-325 MG tablet   [id number] [place], [place] [address]. (Ph: [phone])

## 2021-07-02 NOTE — Telephone Encounter (Signed)
Jennifer [name] rescheduled her appointment.

## 2021-07-02 NOTE — Telephone Encounter (Signed)
Oxycodone: Last rx written 06/05/21. Last OV 04/26/21. Next OV 07/11/21. UDS 06/16/19.

## 2021-07-03 MED ORDER — OXYCODONE-ACETAMINOPHEN 5-325 MG PO TABS: 1.0000 | ORAL_TABLET | ORAL | 0 refills | Status: DC

## 2021-07-08 ENCOUNTER — Ambulatory Visit (INDEPENDENT_AMBULATORY_CARE_PROVIDER_SITE_OTHER): Payer: Medicare Other | Admitting: Podiatry

## 2021-07-08 ENCOUNTER — Other Ambulatory Visit: Payer: Self-pay | Admitting: Podiatry

## 2021-07-08 ENCOUNTER — Telehealth: Payer: Self-pay | Admitting: *Deleted

## 2021-07-08 ENCOUNTER — Telehealth: Payer: Self-pay | Admitting: Dietician

## 2021-07-08 ENCOUNTER — Other Ambulatory Visit: Payer: Self-pay

## 2021-07-08 ENCOUNTER — Ambulatory Visit (INDEPENDENT_AMBULATORY_CARE_PROVIDER_SITE_OTHER): Payer: Medicare Other

## 2021-07-08 DIAGNOSIS — L97519 Non-pressure chronic ulcer of other part of right foot with unspecified severity: Secondary | ICD-10-CM | POA: Insufficient documentation

## 2021-07-08 DIAGNOSIS — E08621 Diabetes mellitus due to underlying condition with foot ulcer: Secondary | ICD-10-CM | POA: Diagnosis not present

## 2021-07-08 DIAGNOSIS — L97512 Non-pressure chronic ulcer of other part of right foot with fat layer exposed: Secondary | ICD-10-CM

## 2021-07-08 HISTORY — DX: Non-pressure chronic ulcer of other part of right foot with unspecified severity: L97.519

## 2021-07-08 MED ORDER — DOXYCYCLINE HYCLATE 100 MG PO TABS: 100.0000 mg | ORAL_TABLET | Freq: Two times a day (BID) | ORAL | 0 refills | Status: DC

## 2021-07-08 MED ORDER — GENTAMICIN SULFATE 0.1 % EX CREA: 1.0000 "application " | TOPICAL_CREAM | Freq: Two times a day (BID) | CUTANEOUS | 1 refills | Status: DC

## 2021-07-08 NOTE — Telephone Encounter (Signed)
Returned call to patient. States she's in the podiatrist's office waiting to see Dr. Amalia Hailey now. States the 4th digit is black, "split wide open", and filled with pus. She will let us know the outcome.

## 2021-07-08 NOTE — Telephone Encounter (Signed)
Toe is full of puss since yesterday, cannot get in touch with doctors- [place] or foot doctor. Please give her a call

## 2021-07-08 NOTE — Addendum Note (Signed)
Addended by: Lind Guest on: 07/08/2021 04:36 PM   Modules accepted: Orders

## 2021-07-08 NOTE — Progress Notes (Signed)
Subjective:  60 y.o. female with PMHx of diabetes mellitus, LLE BKA, partial fifth ray amputation RT, presenting today with a new complaint regarding a wound that developed to the fourth toe of the right foot over the last few days.  Patient states that she noticed it over the weekend.  It is slightly painful.  She presents today to have it treated and evaluated   Past Medical History:  Diagnosis Date   Cataract    Chronic bronchitis ([place])    "I get it alot" (09/28/2013)   Chronic diastolic heart failure (HCC)    grade 2 per 2D echocardiogram (01/2013)   Chronic lower back pain    Chronic pain syndrome 12/03/2011   Likely secondary to depression, "fibromyalgia", neuropathy, and obesity. Lumbar MRI 2014 no sig change from prior (2008) : Stable hypertrophic facet disease most notable at L4-5. Stable shallow left foraminal/extraforaminal disc protrusion at L4-5. No direct neural compression.       COPD 01/08/2007   PFT's 05/2007 : FEV1/FVC 82, FEV1 64% pred, FEF 25-75% 40% predicted, 16% improvement in FEV1 with bronchodilators.      Diabetic peripheral neuropathy (HCC)    DVT of upper extremity (deep vein thrombosis) ([place]) 03/11/2013   Secondary to PICC line. Right brachial vein, diagnosed on 03/10/2013 Coumadin for 3 months. 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    History of use of hearing aid    Hyperlipidemia    Hyperplastic colon polyp 12/2010   Per colonoscopy (12/2010) - Dr. 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 2/2 tobacco use, DM, and cont weight bearing on surgical wound and developed gangrene    Tobacco abuse    Type II diabetes mellitus with peripheral circulatory disorders, uncontrolled DX: 1993   Insulin dep. Poor control. Complicated by diabetic foot ulcer and diabetic eye disease.        Objective/Physical Exam General: The patient is alert and oriented x3 in no acute distress.  Dermatology:  Wound #1 noted to the right fourth toe measuring 0.8 x 0.8 x 0.2 cm (LxWxD).   To the noted ulceration(s), there is no eschar. There is a moderate amount of slough, fibrin, and necrotic tissue noted.  Minimal granulation tissue noted.  There is a minimal amount of serosanguineous drainage noted.  Currently there is no exposed bone muscle-tendon ligament or joint Although it is concerning because it probes almost to the periosteal layer of the bone. There is no malodor. Periwound integrity is intact. Skin is warm, dry and supple bilateral lower extremities.  Vascular: Known history of PVD  Neurological: Epicritic and protective threshold diminished bilaterally.   Musculoskeletal Exam: H/o BKA LLE.  Partial fifth ray amputation RT.  Assessment: 1.  Ulcer right fourth toe secondary to diabetes mellitus 2. diabetes mellitus w/ peripheral neuropathy 3. 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   Plan of Care:  1. Patient was evaluated. 2. medically necessary excisional debridement including subcutaneous tissue was performed using a tissue nipper and a chisel blade. Excisional debridement of all the necrotic nonviable tissue down to healthy bleeding viable tissue was performed with post-debridement measurements same as pre-. 3. the wound was cleansed and dry sterile dressing applied. 4.  Cultures taken and sent to pathology for culture and sensitivity   5.  Prescription for gentamicin cream applied daily  6.  Prescription for doxycycline 100 mg 2 times daily #20  7.  Patient is to return to clinic in 3 weeks.    Edrick Kins, DPM [facility]  Dr. Edrick Kins, DPM    [address], [place] [address]                Office [phone]  Fax [phone]

## 2021-07-11 ENCOUNTER — Encounter: Payer: Self-pay | Admitting: Internal Medicine

## 2021-07-11 ENCOUNTER — Ambulatory Visit (INDEPENDENT_AMBULATORY_CARE_PROVIDER_SITE_OTHER): Payer: Medicare Other | Admitting: Internal Medicine

## 2021-07-11 ENCOUNTER — Encounter: Payer: Self-pay | Admitting: Dietician

## 2021-07-11 ENCOUNTER — Ambulatory Visit (INDEPENDENT_AMBULATORY_CARE_PROVIDER_SITE_OTHER): Payer: Medicare Other | Admitting: Dietician

## 2021-07-11 VITALS — BP 134/68 | HR 86 | Temp 97.7°F | Ht 67.0 in | Wt 248.0 lb

## 2021-07-11 DIAGNOSIS — I5032 Chronic diastolic (congestive) heart failure: Secondary | ICD-10-CM

## 2021-07-11 DIAGNOSIS — N3946 Mixed incontinence: Secondary | ICD-10-CM

## 2021-07-11 DIAGNOSIS — H259 Unspecified age-related cataract: Secondary | ICD-10-CM

## 2021-07-11 DIAGNOSIS — E1122 Type 2 diabetes mellitus with diabetic chronic kidney disease: Secondary | ICD-10-CM

## 2021-07-11 DIAGNOSIS — E118 Type 2 diabetes mellitus with unspecified complications: Secondary | ICD-10-CM

## 2021-07-11 DIAGNOSIS — Z79899 Other long term (current) drug therapy: Secondary | ICD-10-CM

## 2021-07-11 DIAGNOSIS — I739 Peripheral vascular disease, unspecified: Secondary | ICD-10-CM

## 2021-07-11 DIAGNOSIS — E1165 Type 2 diabetes mellitus with hyperglycemia: Secondary | ICD-10-CM | POA: Diagnosis not present

## 2021-07-11 DIAGNOSIS — R251 Tremor, unspecified: Secondary | ICD-10-CM

## 2021-07-11 DIAGNOSIS — IMO0002 Reserved for concepts with insufficient information to code with codable children: Secondary | ICD-10-CM

## 2021-07-11 DIAGNOSIS — I152 Hypertension secondary to endocrine disorders: Secondary | ICD-10-CM

## 2021-07-11 DIAGNOSIS — E042 Nontoxic multinodular goiter: Secondary | ICD-10-CM | POA: Diagnosis not present

## 2021-07-11 DIAGNOSIS — N183 Chronic kidney disease, stage 3 unspecified: Secondary | ICD-10-CM

## 2021-07-11 DIAGNOSIS — Z0000 Encounter for general adult medical examination without abnormal findings: Secondary | ICD-10-CM | POA: Diagnosis not present

## 2021-07-11 DIAGNOSIS — Z89612 Acquired absence of left leg above knee: Secondary | ICD-10-CM

## 2021-07-11 DIAGNOSIS — K118 Other diseases of salivary glands: Secondary | ICD-10-CM

## 2021-07-11 DIAGNOSIS — T783XXD Angioneurotic edema, subsequent encounter: Secondary | ICD-10-CM

## 2021-07-11 DIAGNOSIS — Z9889 Other specified postprocedural states: Secondary | ICD-10-CM

## 2021-07-11 DIAGNOSIS — E1159 Type 2 diabetes mellitus with other circulatory complications: Secondary | ICD-10-CM

## 2021-07-11 DIAGNOSIS — L97519 Non-pressure chronic ulcer of other part of right foot with unspecified severity: Secondary | ICD-10-CM

## 2021-07-11 DIAGNOSIS — Z89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): Secondary | ICD-10-CM

## 2021-07-11 DIAGNOSIS — M7712 Lateral epicondylitis, left elbow: Secondary | ICD-10-CM | POA: Diagnosis not present

## 2021-07-11 DIAGNOSIS — F1120 Opioid dependence, uncomplicated: Secondary | ICD-10-CM

## 2021-07-11 DIAGNOSIS — K635 Polyp of colon: Secondary | ICD-10-CM

## 2021-07-11 HISTORY — DX: Other diseases of salivary glands: K11.8

## 2021-07-11 HISTORY — DX: Acquired absence of left leg above knee: Z89.612

## 2021-07-11 HISTORY — DX: Acquired absence of other right toe(s): Z89.421

## 2021-07-11 LAB — WOUND CULTURE
MICRO NUMBER:: 12131163
SPECIMEN QUALITY:: ADEQUATE

## 2021-07-11 LAB — POCT GLYCOSYLATED HEMOGLOBIN (HGB A1C): Hemoglobin A1C: 7.4 % — AB (ref 4.0–5.6)

## 2021-07-11 LAB — HOUSE ACCOUNT TRACKING

## 2021-07-11 LAB — GLUCOSE, CAPILLARY: Glucose-Capillary: 115 mg/dL — ABNORMAL HIGH (ref 70–99)

## 2021-07-11 MED ORDER — DICLOFENAC SODIUM 1 % EX GEL: 2.0000 g | Freq: Four times a day (QID) | CUTANEOUS | 1 refills | Status: DC

## 2021-07-11 NOTE — Assessment & Plan Note (Signed)
Hospitalized, ACEI implicated and discontinued.
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
## 2021-07-11 NOTE — Assessment & Plan Note (Signed)
134/68.  ACE inhibitor has been stopped due to angioedema.  Beta-blocker has been increased.  No changes today though her long-term goal is SBP less than 130.  She continues to decline diuretic due to her severe urinary incontinence and mobility impairment.

## 2021-07-11 NOTE — Assessment & Plan Note (Signed)
No tremor during today's visit.  I continue to suspect that polypharmacy is contributing and out an underlying neurologic condition though we will continue to monitor closely.

## 2021-07-11 NOTE — Assessment & Plan Note (Signed)
Longstanding chronic opioid therapy continues.  She does not request early refills and reports that she takes 2-3 Percocets daily.  Pain is multifocal but her back has been a significant problem and she has not responded to spinal injections.

## 2021-07-11 NOTE — Assessment & Plan Note (Signed)
She is awaiting appointment with [facility] to initiate Botox therapy.  She is not sure if she is taking Vesicare which is on her medication list.  Myrbetriq is incompletely beneficial.

## 2021-07-11 NOTE — Assessment & Plan Note (Signed)
No longer taking trazodone or Robaxin but continues to have a number of sedating medicines on her list.  This will be a slow process.

## 2021-07-11 NOTE — Assessment & Plan Note (Signed)
Improved control to A1c of 7.4.  I will discuss with RD diabetes coordinator (who also met with her today) further changes to regimen.  Patient tells me she has not been using much meal coverage as CBGs have been better controlled.

## 2021-07-11 NOTE — Assessment & Plan Note (Signed)
Colonoscopy needed; she has had so many acute problems which have delayed this important appt.

## 2021-07-11 NOTE — Assessment & Plan Note (Signed)
Extractions completed, f/u with ophthalmologist 07/2021.

## 2021-07-11 NOTE — Assessment & Plan Note (Addendum)
2+ dp pulse R foot, see entry for right fourth toe ulcer

## 2021-07-11 NOTE — Assessment & Plan Note (Signed)
Lungs are clear, though she does have significant peripheral edema which is most likely dependent (it improved significantly with bedrest alone during her recent hospitalization).  No respiratory symptoms, not hypoxic.  Monitor.

## 2021-07-11 NOTE — Progress Notes (Signed)
Diabetes Self-Management Education  Visit Type: Follow-up (anuual fu)  Appt. Start Time: 0915 Appt. End Time: 0945  07/11/2021  [name], identified by name and date of birth, is a 60 y.o. female with a diagnosis of Diabetes:  Marland Kitchen Typ2  ASSESSMENT Jennifer [name] has had Cataract surgery both eyes with Dr. Katy Fitch. Has a follow up on 07/22/21. She is interested in obtaining a personal CGM. Her phone is compatible with the [place] 2.  Diabetes Medications: semaglutide 0.5mg  weekly, Tresiba 70 units daily, Humalog 2-10 units 1-2x/daily, metformin  Blood sugar self monitoring- improved to almost 1x/day over the past month. Average 174 decreased from 200s.  This is consistent with lower A1C.  Food intake- 1-2 meals daily- no deficient in food availability or security, sister cooks for her, but decreased appetite per patient  Blood pressure and lipids well controlled.   Diabetes Self-Management Education - 07/11/21 1100       Visit Information   Visit Type Follow-up   anuual fu     Health Coping   How would you rate your overall health? Good      Psychosocial Assessment   Patient Belief/Attitude about Diabetes Motivated to manage diabetes    Self-care barriers Unsteady gait/risk for falls;Lack of material resources    Self-management support Doctor's office;Family;None    Patient Concerns Monitoring    Special Needs None    Preferred Learning Style No preference indicated    Learning Readiness Ready    How often do you need to have someone help you when you read instructions, pamphlets, or other written materials from your doctor or pharmacy? 2 - Rarely    What is the last grade level you completed in school? 12      Pre-Education Assessment   Patient understands monitoring blood glucose, interpreting and using results Needs Review      Complications   Last HgB A1C per patient/outside source 7.4 %    How often do you check your blood sugar? 1-2 times/day    Fasting Blood  glucose range (mg/dL) 70-129    Postprandial Blood glucose range (mg/dL) 180-200    Number of hypoglycemic episodes per month 0    Number of hyperglycemic episodes per week 5    Can you tell when your blood sugar is high? No    Have you had a dilated eye exam in the past 12 months? Yes    Have you had a dental exam in the past 12 months? No    Are you checking your feet? Yes    How many days per week are you checking your feet? 7      Dietary Intake   Breakfast eggs, grits, bacon    Beverage(s) water      Exercise   Exercise Type ADL's      Patient Education   Previous Diabetes Education Yes (please comment)   here most years recently   Monitoring Other (comment)   discussed personal CGM options and how they work     Individualized Goals (developed by patient)   Monitoring  test my blood glucose as discussed      Outcomes   Expected Outcomes Demonstrated interest in learning. Expect positive outcomes    Future DMSE 2 wks;4-6 wks   depends when she gets CGM   Program Status Not Completed      Subsequent Visit   Since your last visit have you continued or begun to take your medications as prescribed? Yes  Since your last visit have you had your blood pressure checked? No    Since your last visit have you experienced any weight changes? Loss    Weight Loss (lbs) 20    Since your last visit, are you checking your blood glucose at least once a day? No   doing better at 19/30 days            Individualized Plan for Diabetes Self-Management Training:   Learning Objective:  Patient will have a greater understanding of diabetes self-management. Patient education plan is to attend individual and/or group sessions per assessed needs and concerns.   Plan:   There are no Patient Instructions on file for this visit.  Expected Outcomes:  Demonstrated interest in learning. Expect positive outcomes  Education material provided: Diabetes Resources  If problems or questions,  patient to contact team via:  Phone  Future DSME appointment: 2 wks, 4-6 wks (depends when she gets CGM) [name], RD 07/11/2021 11:56 AM.

## 2021-07-11 NOTE — Assessment & Plan Note (Signed)
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  We may see some mild improvement now that ACE has been discontinued.

## 2021-07-15 ENCOUNTER — Encounter: Payer: Self-pay | Admitting: Internal Medicine

## 2021-07-17 ENCOUNTER — Telehealth: Payer: Self-pay | Admitting: Dietician

## 2021-07-17 NOTE — Telephone Encounter (Signed)
Call transferred to me form triage about patient with high blood sugar: Jennifer [name] says her blood sugar was 115 this am, she had 2 eggs, 2 strips bacon,  1/2 15 oz bottle orange soda10-11 am,  , fat free chocolate pudding ~ 3 pm and nothing else to eat/drink today. Now hr blood sugar reading says "High". She wants to know "How much Humalog should I take?" I asked her to recheck her glucose.  She rechecked and it is still reading high , > 600 mg/dl.  Last [name] dose was 70 units today, she has been taking this daily with a dose  of 70 units yesterday at  10 am.  Last Humalog dose was 15 units day before yesterday.   Took metformin 1st dose today~ 3 pm. Usually takes it with breakfast but because her sugar was on the low side she elected to take it later.  Patient was advised to take 14 units Humlaog per her orders, drink plenty of water and check her blood sugars every 4-5 hours and take insulin according to hr correction scale.

## 2021-07-17 NOTE — Telephone Encounter (Addendum)
Called patient back and she says she took 35 units instead of 14 units of Humalog. She rescheduled her blood sugar while I was on the phone and it was 154. She was advised to eat a meal soon and continue to check her blood sugars every 2-4 hours. She verbalized understanding.

## 2021-07-22 LAB — HM DIABETES EYE EXAM

## 2021-07-29 ENCOUNTER — Other Ambulatory Visit: Payer: Self-pay

## 2021-07-29 ENCOUNTER — Ambulatory Visit (INDEPENDENT_AMBULATORY_CARE_PROVIDER_SITE_OTHER): Payer: Medicare Other | Admitting: Podiatry

## 2021-07-29 DIAGNOSIS — L97512 Non-pressure chronic ulcer of other part of right foot with fat layer exposed: Secondary | ICD-10-CM

## 2021-07-29 DIAGNOSIS — E08621 Diabetes mellitus due to underlying condition with foot ulcer: Secondary | ICD-10-CM

## 2021-08-05 ENCOUNTER — Other Ambulatory Visit: Payer: Self-pay | Admitting: Internal Medicine

## 2021-08-05 MED ORDER — OXYCODONE-ACETAMINOPHEN 5-325 MG PO TABS: 1.0000 | ORAL_TABLET | ORAL | 0 refills | Status: DC

## 2021-08-05 NOTE — Telephone Encounter (Signed)
Refill Request [id number] [place], [place] - [address]  oxyCODONE-acetaminophen (PERCOCET/ROXICET) 5-325 MG tablet

## 2021-08-05 NOTE — Progress Notes (Signed)
Subjective:  60 y.o. female with PMHx of diabetes mellitus, LLE BKA, partial fifth ray amputation RT, presenting today for follow-up evaluation of an ulcer that developed to the fourth toe of the right foot.  Patient states there is no significant improvement.  She continues have some pain and tenderness associated to the toe.  No new complaints at this time  Past Medical History:  Diagnosis Date   Acute vestibular syndrome, resolved 03/02/2021   Angioedema 06/14/2021   Burn of finger of right hand, second degree 02/05/2021   Occurred during cooking (frying), poor sensation due to neuropathy, pt punctured blister to allow it to drain, skin has since desquamated over dorsal joint, no infection.  Keep clean and dry, OTC antibacterial ointment.   Cataract    Chronic bronchitis ([place])    "I get it alot" (09/28/2013)   Chronic diastolic heart failure (HCC)    grade 2 per 2D echocardiogram (01/2013)   Chronic lower back pain    Chronic pain syndrome 12/03/2011   Likely secondary to depression, "fibromyalgia", neuropathy, and obesity. Lumbar MRI 2014 no sig change from prior (2008) : Stable hypertrophic facet disease most notable at L4-5. Stable shallow left foraminal/extraforaminal disc protrusion at L4-5. No direct neural compression.       COPD 01/08/2007   PFT's 05/2007 : FEV1/FVC 82, FEV1 64% pred, FEF 25-75% 40% predicted, 16% improvement in FEV1 with bronchodilators.      Diabetic peripheral neuropathy (HCC)    Dizziness, resolved (admitted with vestibular migraine)    DVT of upper extremity (deep vein thrombosis) ([place]) 03/11/2013   Secondary to PICC line. Right brachial vein, diagnosed on 03/10/2013 Coumadin for 3 months. End date 06/10/2013    Environmental allergies    Hx: of   Fatty liver 2003   observed on ultrasound abdomen   Fibromyalgia    GERD (gastroesophageal reflux disease)    Glaucoma    History of bacterial endocarditis 2014   Endocarditis involving mitral and tricuspid  valves.  S. Aureus and GBS.    History of use of hearing aid    Hyperlipidemia    Hyperplastic colon polyp 12/2010   Per colonoscopy (12/2010) - Dr. 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 2/2 tobacco use, DM, and cont weight bearing on surgical wound and developed gangrene    Tobacco abuse    Type II diabetes mellitus with peripheral circulatory disorders, uncontrolled DX: 1993   Insulin dep. Poor control. Complicated by diabetic foot ulcer and diabetic eye disease.        Objective/Physical Exam General: The patient is alert and oriented x3 in no acute distress.  Dermatology:  Wound #1 noted to the right fourth toe measuring 0.8 x 0.8 x 0.2 cm (LxWxD).   To the noted ulceration(s), there is no eschar. There is a moderate amount of slough, fibrin, and necrotic tissue noted.  Minimal granulation tissue noted.  There is a minimal amount of serosanguineous drainage noted.  Currently there is no exposed bone muscle-tendon ligament or joint Although it is concerning because it probes almost to the periosteal layer of the bone. There is no  malodor. Periwound integrity is intact. Skin is warm, dry and supple bilateral lower extremities.  Vascular: Known history of PVD  Neurological: Epicritic and protective threshold diminished bilaterally.   Musculoskeletal Exam: H/o BKA LLE.  Partial fifth ray amputation RT.  Radiographic exam taken last visit: No erosion or cortical destruction that would be concerning for osteomyelitis at the moment.  Assessment: 1.  Ulcer right fourth toe secondary to diabetes mellitus 2. diabetes mellitus w/  peripheral neuropathy 3. PSxHx BKA LLE, Partial 5th ray amputation RT.   Plan of Care:  1. Patient was evaluated.  Cultures reviewed today 2. medically necessary excisional debridement including subcutaneous tissue was performed using a tissue nipper and a chisel blade. Excisional debridement of all the necrotic nonviable tissue down to healthy bleeding viable tissue was performed with post-debridement measurements same as pre-. 3. the wound was cleansed and dry sterile dressing applied. 4.  Postsurgical shoe dispensed. 5.  Continue gentamicin cream with a light dressing daily 6.  Referral placed to the [facility] wound care center for second opinion and further treatment and evaluation.   7.  Return to clinic as needed  Edrick Kins, DPM [facility]  Dr. Edrick Kins, DPM    [address], [place] [address]                Office [phone]  Fax [phone]

## 2021-08-06 ENCOUNTER — Other Ambulatory Visit: Payer: Self-pay | Admitting: Internal Medicine

## 2021-08-06 ENCOUNTER — Other Ambulatory Visit: Payer: Self-pay | Admitting: *Deleted

## 2021-08-06 DIAGNOSIS — J449 Chronic obstructive pulmonary disease, unspecified: Secondary | ICD-10-CM

## 2021-08-06 MED ORDER — ANORO ELLIPTA 62.5-25 MCG/INH IN AEPB: 1.0000 | INHALATION_SPRAY | Freq: Every morning | RESPIRATORY_TRACT | 6 refills | Status: DC

## 2021-08-06 MED ORDER — PREGABALIN 200 MG PO CAPS: ORAL_CAPSULE | ORAL | 2 refills | Status: DC

## 2021-08-06 MED ORDER — ALBUTEROL SULFATE HFA 108 (90 BASE) MCG/ACT IN AERS: INHALATION_SPRAY | RESPIRATORY_TRACT | 3 refills | Status: DC

## 2021-08-06 NOTE — Telephone Encounter (Signed)
Refill Request   albuterol [facility] HFA) 108 (90 Base) MCG/ACT inhaler   [facility] [place] - [address], [place] (Ph: [phone])

## 2021-08-13 ENCOUNTER — Other Ambulatory Visit: Payer: Self-pay | Admitting: Urology

## 2021-08-15 ENCOUNTER — Encounter (HOSPITAL_COMMUNITY): Payer: Self-pay

## 2021-08-15 ENCOUNTER — Other Ambulatory Visit: Payer: Self-pay | Admitting: *Deleted

## 2021-08-15 DIAGNOSIS — E1151 Type 2 diabetes mellitus with diabetic peripheral angiopathy without gangrene: Secondary | ICD-10-CM

## 2021-08-15 DIAGNOSIS — IMO0002 Reserved for concepts with insufficient information to code with codable children: Secondary | ICD-10-CM

## 2021-08-15 DIAGNOSIS — E1121 Type 2 diabetes mellitus with diabetic nephropathy: Secondary | ICD-10-CM

## 2021-08-15 NOTE — Progress Notes (Addendum)
   PCP - Angelica Pou ,MD Cardiologist - no  PPM/ICD -  Device Orders -  Rep Notified -   Chest x-ray - 06-14-21 epic EKG - 05-26-21 epic Stress Test -  ECHO - 03-04-21 epic Cardiac Cath -  hgbA1c 07-11-21 epic  Sleep Study -  CPAP -   Fasting Blood Sugar - 80-100's Checks Blood Sugar __1-3___ times a day  Blood Thinner Instructions: Aspirin Instructions: 81 mg stopped 5 days prior  ERAS Protcol - PRE-SURGERY    COVID TEST- N/A COVID vaccine -fully vaccinated 2 shots [place]  Activity--Able to walk around home without SOB with walker Anesthesia review: DM, HTN, CHF, COPD, LLE BKA  Patient denies shortness of breath, fever, cough and chest pain at PAT appointment   All instructions explained to the patient, with a verbal understanding of the material. Patient agrees to go over the instructions while at home for a better understanding. Patient also instructed to self quarantine after being tested for COVID-19. The opportunity to ask questions was provided.

## 2021-08-15 NOTE — Patient Instructions (Addendum)
DUE TO COVID-19 ONLY ONE VISITOR IS ALLOWED TO COME WITH YOU AND STAY IN THE WAITING ROOM ONLY DURING PRE OP AND PROCEDURE DAY OF SURGERY.   TWO VISITOR  MAY VISIT WITH YOU AFTER SURGERY IN YOUR PRIVATE ROOM DURING VISITING HOURS ONLY!         Your procedure is scheduled on: 08-21-21   Report to [facility] Main  Entrance   Report to admitting at       [place]  AM     Call this number if you have problems the morning of surgery [phone]   Remember: Do not eat food :After Midnight.You may have clear liquids until 0730 am then nothing by mouth    CLEAR LIQUID DIET   Foods Allowed                                                                     Foods Excluded Water Black Coffee and tea, regular and decaf                             liquids that you cannot  Plain Jell-O any favor except red or purple                                           see through such as: Fruit ices (not with fruit pulp)                                                     milk, soups, orange juice  Iced Popsicles                                                  All solid food Carbonated beverages, regular and diet                                    Cranberry, grape and apple juices Sports drinks like Gatorade Lightly seasoned clear broth or consume(fat free) Sugar  _____________________________________________________________________     BRUSH YOUR TEETH MORNING OF SURGERY AND RINSE YOUR MOUTH OUT, NO CHEWING GUM CANDY OR MINTS.     Take these medicines the morning of surgery with A SIP OF WATER: inhaler bring with you, Topiramate, Bactrim, Rosuvastatin, Lyrica, Oxycodone if needed, omeprazole, mirabegron, metoprolol, Wellbutrin, amlodipine  DO NOT TAKE ANY DIABETIC MEDICATIONS DAY OF YOUR SURGERY How to Manage Your Diabetes Before and After Surgery  Why is it important to control my blood sugar before and after surgery? Improving blood sugar levels before and after surgery helps healing  and can limit problems. A way of improving blood sugar control is eating a healthy diet by:  Eating less sugar and carbohydrates  Increasing activity/exercise  Talking with your  doctor about reaching your blood sugar goals High blood sugars (greater than 180 mg/dL) can raise your risk of infections and slow your recovery, so you will need to focus on controlling your diabetes during the weeks before surgery. Make sure that the doctor who takes care of your diabetes knows about your planned surgery including the date and location.  How do I manage my blood sugar before surgery? Check your blood sugar at least 4 times a day, starting 2 days before surgery, to make sure that the level is not too high or low. Check your blood sugar the morning of your surgery when you wake up and every 2 hours until you get to the Short Stay unit. If your blood sugar is less than 70 mg/dL, you will need to treat for low blood sugar: Do not take insulin. Treat a low blood sugar (less than 70 mg/dL) with  cup of clear juice (cranberry or apple), 4 glucose tablets, OR glucose gel. Recheck blood sugar in 15 minutes after treatment (to make sure it is greater than 70 mg/dL). If your blood sugar is not greater than 70 mg/dL on recheck, call [phone] for further instructions. Report your blood sugar to the short stay nurse when you get to Short Stay.  If you are admitted to the hospital after surgery: Your blood sugar will be checked by the staff and you will probably be given insulin after surgery (instead of oral diabetes medicines) to make sure you have good blood sugar levels. The goal for blood sugar control after surgery is 80-180 mg/dL.   WHAT DO I DO ABOUT MY DIABETES MEDICATION?  Do not take oral diabetes medicines (pills) the morning of surgery.  THE NIGHT BEFORE SURGERY, take  0   units of  bedtime     insulin.       THE MORNING OF SURGERY, take 50% 54fnormal Tresbia dose     The day of surgery,  do not take other diabetes injectables, including Byetta (exenatide), Bydureon (exenatide ER), Victoza (liraglutide), or Trulicity (dulaglutide).  If your CBG is greater than 220 mg/dL, you may take  of your sliding scale  (correction) dose of insulin.                You may not have any metal on your body including hair pins and              piercings  Do not wear jewelry, make-up, lotions, powders,perfumes,      deodorant             Do not wear nail polish on your fingernails or toenails .  Do not shave  48 hours prior to surgery.            Do not bring valuables to the hospital. [place]  Contacts, dentures or bridgework may not be worn into surgery.       Patients discharged the day of surgery will not be allowed to drive home. IF YOU ARE HAVING SURGERY AND GOING HOME THE SAME DAY, YOU MUST HAVE AN ADULT TO DRIVE YOU HOME AND BE WITH YOU FOR 24 HOURS. YOU MAY GO HOME BY TAXI OR UBER OR ORTHERWISE, BUT AN ADULT MUST ACCOMPANY YOU HOME AND STAY WITH YOU FOR 24 HOURS.  Name and phone number of your driver:  Special Instructions: N/A  Please read over the following fact sheets you were given: _____________________________________________________________________             [facility] - Preparing for Surgery Before surgery, you can play an important role.  Because skin is not sterile, your skin needs to be as free of germs as possible.  You can reduce the number of germs on your skin by washing with CHG (chlorahexidine gluconate) soap before surgery.  CHG is an antiseptic cleaner which kills germs and bonds with the skin to continue killing germs even after washing. Please DO NOT use if you have an allergy to CHG or antibacterial soaps.  If your skin becomes reddened/irritated stop using the CHG and inform your nurse when you arrive at Short Stay. Do not shave (including legs and underarms) for at least 48 hours prior to  the first CHG shower.  You may shave your face/neck. Please follow these instructions carefully:  1.  Shower with CHG Soap the night before surgery and the  morning of Surgery.  2.  If you choose to wash your hair, wash your hair first as usual with your  normal  shampoo.  3.  After you shampoo, rinse your hair and body thoroughly to remove the  shampoo.                           4.  Use CHG as you would any other liquid soap.  You can apply chg directly  to the skin and wash                       Gently with a scrungie or clean washcloth.  5.  Apply the CHG Soap to your body ONLY FROM THE NECK DOWN.   Do not use on face/ open                           Wound or open sores. Avoid contact with eyes, ears mouth and genitals (private parts).                       Wash face,  Genitals (private parts) with your normal soap.             6.  Wash thoroughly, paying special attention to the area where your surgery  will be performed.  7.  Thoroughly rinse your body with warm water from the neck down.  8.  DO NOT shower/wash with your normal soap after using and rinsing off  the CHG Soap.                9.  Pat yourself dry with a clean towel.            10.  Wear clean pajamas.            11.  Place clean sheets on your bed the night of your first shower and do not  sleep with pets. Day of Surgery : Do not apply any lotions/deodorants the morning of surgery.  Please wear clean clothes to the hospital/surgery center.  FAILURE TO FOLLOW THESE INSTRUCTIONS MAY RESULT IN THE CANCELLATION OF YOUR SURGERY PATIENT SIGNATURE_________________________________  NURSE SIGNATURE__________________________________  ________________________________________________________________________

## 2021-08-16 ENCOUNTER — Encounter (HOSPITAL_COMMUNITY): Payer: Self-pay

## 2021-08-16 ENCOUNTER — Other Ambulatory Visit: Payer: Self-pay

## 2021-08-16 ENCOUNTER — Encounter (HOSPITAL_COMMUNITY)
Admission: RE | Admit: 2021-08-16 | Discharge: 2021-08-16 | Disposition: A | Discharge status: Home / Self Care | Payer: Medicare Other | Source: Ambulatory Visit | Attending: Urology | Admitting: Urology

## 2021-08-16 DIAGNOSIS — I130 Hypertensive heart and chronic kidney disease with heart failure and stage 1 through stage 4 chronic kidney disease, or unspecified chronic kidney disease: Secondary | ICD-10-CM | POA: Insufficient documentation

## 2021-08-16 DIAGNOSIS — I509 Heart failure, unspecified: Secondary | ICD-10-CM | POA: Diagnosis not present

## 2021-08-16 DIAGNOSIS — F17210 Nicotine dependence, cigarettes, uncomplicated: Secondary | ICD-10-CM | POA: Diagnosis not present

## 2021-08-16 DIAGNOSIS — J449 Chronic obstructive pulmonary disease, unspecified: Secondary | ICD-10-CM | POA: Insufficient documentation

## 2021-08-16 DIAGNOSIS — Z792 Long term (current) use of antibiotics: Secondary | ICD-10-CM | POA: Diagnosis not present

## 2021-08-16 DIAGNOSIS — Z89512 Acquired absence of left leg below knee: Secondary | ICD-10-CM | POA: Insufficient documentation

## 2021-08-16 DIAGNOSIS — K219 Gastro-esophageal reflux disease without esophagitis: Secondary | ICD-10-CM | POA: Insufficient documentation

## 2021-08-16 DIAGNOSIS — Z01812 Encounter for preprocedural laboratory examination: Secondary | ICD-10-CM | POA: Insufficient documentation

## 2021-08-16 DIAGNOSIS — Z7951 Long term (current) use of inhaled steroids: Secondary | ICD-10-CM | POA: Diagnosis not present

## 2021-08-16 DIAGNOSIS — Z7982 Long term (current) use of aspirin: Secondary | ICD-10-CM | POA: Diagnosis not present

## 2021-08-16 DIAGNOSIS — Z79891 Long term (current) use of opiate analgesic: Secondary | ICD-10-CM | POA: Diagnosis not present

## 2021-08-16 DIAGNOSIS — Z79899 Other long term (current) drug therapy: Secondary | ICD-10-CM | POA: Insufficient documentation

## 2021-08-16 DIAGNOSIS — Z7984 Long term (current) use of oral hypoglycemic drugs: Secondary | ICD-10-CM | POA: Diagnosis not present

## 2021-08-16 DIAGNOSIS — N183 Chronic kidney disease, stage 3 unspecified: Secondary | ICD-10-CM | POA: Insufficient documentation

## 2021-08-16 DIAGNOSIS — N3281 Overactive bladder: Secondary | ICD-10-CM | POA: Diagnosis not present

## 2021-08-16 DIAGNOSIS — Z794 Long term (current) use of insulin: Secondary | ICD-10-CM | POA: Diagnosis not present

## 2021-08-16 HISTORY — DX: Spinal stenosis, site unspecified: M48.00

## 2021-08-16 LAB — CBC
HCT: 37.7 % (ref 36.0–46.0)
Hemoglobin: 12.2 g/dL (ref 12.0–15.0)
MCH: 32.3 pg (ref 26.0–34.0)
MCHC: 32.4 g/dL (ref 30.0–36.0)
MCV: 99.7 fL (ref 80.0–100.0)
Platelets: 197 10*3/uL (ref 150–400)
RBC: 3.78 MIL/uL — ABNORMAL LOW (ref 3.87–5.11)
RDW: 16.1 % — ABNORMAL HIGH (ref 11.5–15.5)
WBC: 9.4 10*3/uL (ref 4.0–10.5)
nRBC: 0 % (ref 0.0–0.2)

## 2021-08-16 LAB — BASIC METABOLIC PANEL
Anion gap: 8 (ref 5–15)
BUN: 17 mg/dL (ref 8–23)
CO2: 23 mmol/L (ref 22–32)
Calcium: 9.3 mg/dL (ref 8.9–10.3)
Chloride: 113 mmol/L — ABNORMAL HIGH (ref 98–111)
Creatinine, Ser: 1.05 mg/dL — ABNORMAL HIGH (ref 0.44–1.00)
GFR, Estimated: >60 mL/min (ref >60)
Glucose, Bld: 162 mg/dL — ABNORMAL HIGH (ref 70–99)
Potassium: 4.5 mmol/L (ref 3.5–5.1)
Sodium: 144 mmol/L (ref 135–145)

## 2021-08-16 LAB — GLUCOSE, CAPILLARY: Glucose-Capillary: 176 mg/dL — ABNORMAL HIGH (ref 70–99)

## 2021-08-16 MED ORDER — INSULIN LISPRO (1 UNIT DIAL) 100 UNIT/ML (KWIKPEN): PEN_INJECTOR | SUBCUTANEOUS | 3 refills | Status: DC

## 2021-08-16 MED ORDER — OZEMPIC (0.25 OR 0.5 MG/DOSE) 2 MG/1.5ML ~~LOC~~ SOPN: 0.5000 mg | PEN_INJECTOR | SUBCUTANEOUS | 5 refills | Status: DC

## 2021-08-16 MED ORDER — ONETOUCH VERIO VI STRP: 1.0000 | ORAL_STRIP | 3 refills | Status: DC | PRN

## 2021-08-16 NOTE — Progress Notes (Signed)
Pt. Informed to remove nail polish or her surgery could be cancelled

## 2021-08-19 NOTE — Telephone Encounter (Signed)
error 

## 2021-08-19 NOTE — Progress Notes (Signed)
Anesthesia Chart Review   Case: [id number] Date/Time: 08/21/21 1018   Procedure: CYSTOSCOPY BOTOX INJECTION   Anesthesia type: Choice   Pre-op diagnosis: OVERACTIVE BLADDER URGE INCONTINENCE   Location: [facility] PROC[facility] / [facility]   Surgeons: Lucas Mallow, MD       DISCUSSION:60 y.o. every day smoker with h/o GERD, HTN, COPD, CHF, DM II, CKD Stage III, s/p left BKA 2014, overactive bladder scheduled for above procedure 08/21/2021 with Dr. Link Snuffer.   Pt last seen by internal medicine 07/11/2021, stable at this visit.  VS: BP (!) 153/83   Pulse 80   Temp 36.7 C (Oral)   Resp 17   Ht 5' 7"  (1.702 m)   Wt 112.5 kg Comment: with prosthesis and boot on at preop  SpO2 100%   BMI 38.84 kg/m   PROVIDERS: Angelica Pou, MD is PCP    LABS: Labs reviewed: Acceptable for surgery. (all labs ordered are listed, but only abnormal results are displayed)  Labs Reviewed  BASIC METABOLIC PANEL - Abnormal; Notable for the following components:      Result Value   Chloride 113 (*)    Glucose, Bld 162 (*)    Creatinine, Ser 1.05 (*)    All other components within normal limits  CBC - Abnormal; Notable for the following components:   RBC 3.78 (*)    RDW 16.1 (*)    All other components within normal limits  GLUCOSE, CAPILLARY - Abnormal; Notable for the following components:   Glucose-Capillary 176 (*)    All other components within normal limits     IMAGES:   EKG: 06/15/2021 Rate 97 bpm  Sinus rhythm with Premature atrial complexes Low voltage QRS Inferior infarct , age undetermined Cannot rule out Anterior infarct , age undetermined Abnormal ECG No significant change since last tracing  CV: Echo 03/04/2021  1. Left ventricular ejection fraction, by estimation, is 60 to 65%. The  left ventricle has normal function. The left ventricle has no regional  wall motion abnormalities. Left ventricular diastolic parameters are  consistent with Grade I diastolic   dysfunction (impaired relaxation).   2. Right ventricular systolic function is normal. The right ventricular  size is normal.   3. Right atrial size was mildly dilated.   4. The mitral valve is normal in structure. No evidence of mitral valve  regurgitation.   5. The aortic valve is normal in structure. Aortic valve regurgitation is  not visualized.  Past Medical History:  Diagnosis Date   Acute vestibular syndrome, resolved 03/02/2021   Angioedema 06/14/2021   Asthma    Burn of finger of right hand, second degree 02/05/2021   Occurred during cooking (frying), poor sensation due to neuropathy, pt punctured blister to allow it to drain, skin has since desquamated over dorsal joint, no infection.  Keep clean and dry, OTC antibacterial ointment.   Cataract    CHF (congestive heart failure) (HCC)    Chronic bronchitis ([place])    "I get it alot" (09/28/2013)   Chronic diastolic heart failure (HCC)    grade 2 per 2D echocardiogram (01/2013)   Chronic lower back pain    Chronic pain syndrome 12/03/2011   Likely secondary to depression, "fibromyalgia", neuropathy, and obesity. Lumbar MRI 2014 no sig change from prior (2008) : Stable hypertrophic facet disease most notable at L4-5. Stable shallow left foraminal/extraforaminal disc protrusion at L4-5. No direct neural compression.       COPD 01/08/2007   PFT's 05/2007 :  FEV1/FVC 82, FEV1 64% pred, FEF 25-75% 40% predicted, 16% improvement in FEV1 with bronchodilators.      Depression    Diabetic peripheral neuropathy (HCC)    Dizziness, resolved (admitted with vestibular migraine)    DVT of upper extremity (deep vein thrombosis) ([place]) 03/11/2013   Secondary to PICC line. Right brachial vein, diagnosed on 03/10/2013 Coumadin for 3 months. End date 06/10/2013    Environmental allergies    Hx: of   Fatty liver 2003   observed on ultrasound abdomen   Fibromyalgia    GERD (gastroesophageal reflux disease)    Glaucoma    History of bacterial  endocarditis 2014   Endocarditis involving mitral and tricuspid valves.  S. Aureus and GBS.    History of use of hearing aid    Hyperlipidemia    Hyperplastic colon polyp 12/2010   Per colonoscopy (12/2010) - Dr. 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 2/2 tobacco use, DM, and cont weight bearing on surgical wound and developed gangrene    Spinal stenosis    Tobacco abuse    Type II diabetes mellitus with peripheral circulatory disorders, uncontrolled DX: 1993   Insulin dep. Poor control. Complicated by diabetic foot ulcer and diabetic eye disease.      Past Surgical History:  Procedure Laterality Date   ABDOMINAL HYSTERECTOMY  1997   secondary to uterine fibroids   AMPUTATION Left 08/31/2013   Procedure: AMPUTATION RAY;  Surgeon: Newt Minion, MD;  Location: [place];  Service: Orthopedics;  Laterality: Left;  Left Foot 5th Ray Amputation   AMPUTATION Left 09/28/2013   Procedure: Left Midfoot amputation;  Surgeon: Newt Minion, MD;  Location: [place];  Service: Orthopedics;  Laterality: Left;  Left Midfoot amputation   AMPUTATION Left 10/14/2013   Procedure: AMPUTATION BELOW KNEE- left;  Surgeon: Newt Minion, MD;  Location: [place];  Service: Orthopedics;  Laterality: Left;  Left Below Knee Amputation    AMPUTATION TOE Right 01/15/2017   Procedure: AMPUTATION 5th TOE RIGHT FOOT;  Surgeon: Edrick Kins, DPM;  Location: [place];  Service: Podiatry;  Laterality: Right;   APPLICATION OF WOUND VAC  04/01/2019   Procedure: Application Of Wound Vac;  Surgeon: Newt Minion, MD;  Location: [place];  Service: Orthopedics;;   BLADDER SURGERY      bladder reconstruction surgery   BREAST BIOPSY     multiple-benign per pt   CATARACT EXTRACTION, BILATERAL     summer 2022   COLONOSCOPY     ESOPHAGOGASTRODUODENOSCOPY N/A 09/20/2013   Procedure: ESOPHAGOGASTRODUODENOSCOPY (EGD);  Surgeon: Jerene Bears, MD;  Location: [place];  Service: Gastroenterology;  Laterality: N/A;   FOOT AMPUTATION THROUGH METATARSAL Left 09/28/2013   GANGLION CYST EXCISION     multiple   PERIPHERAL VASCULAR INTERVENTION     stents in lower ext   SHOULDER ARTHROSCOPY Right 11/11/2019   RIGHT SHOULDER ARTHROSCOPY AND DEBRIDEMENT    SHOULDER ARTHROSCOPY Right 11/11/2019   Procedure: RIGHT SHOULDER ARTHROSCOPY AND DEBRIDEMENT;  Surgeon: Newt Minion, MD;  Location: [place];  Service: Orthopedics;  Laterality: Right;   SHOULDER ARTHROSCOPY W/  ROTATOR CUFF REPAIR Bilateral    2 on right one on left   SKIN SPLIT GRAFT Bilateral 05/13/2013   Procedure: Right and Left Foot Allograft Skin Graft;  Surgeon: Newt Minion, MD;  Location: [place];  Service: Orthopedics;  Laterality: Bilateral;  Right and Left Foot Allograft Skin Graft   STUMP REVISION Left 04/01/2019   Procedure: REVISION LEFT BELOW KNEE AMPUTATION;  Surgeon: Newt Minion, MD;  Location: [place];  Service: Orthopedics;  Laterality: Left;   TEE WITHOUT CARDIOVERSION N/A 01/31/2013   Procedure: TRANSESOPHAGEAL ECHOCARDIOGRAM (TEE);  Surgeon: Fay Records, MD;  Location[facility] ENDOSCOPY;  Service: Cardiovascular;  Laterality: N/A;  Rm 3W25   TEE WITHOUT CARDIOVERSION N/A 03/10/2013   Procedure: TRANSESOPHAGEAL ECHOCARDIOGRAM (TEE);  Surgeon: Larey Dresser, MD;  Location: [place];  Service: Cardiovascular;  Laterality: N/A;  Rm. [id number]   TOE AMPUTATION Left 08/31/2013   4TH & 5 TH TOE    TONSILLECTOMY     TUBAL LIGATION     WRIST SURGERY Right    "for tumors" (09/28/2013)    MEDICATIONS:  albuterol (PROAIR HFA) 108 (90 Base) MCG/ACT inhaler   amLODipine (NORVASC) 10 MG tablet   aspirin EC 81 MG  tablet   Blood Glucose Monitoring Suppl (ONETOUCH VERIO FLEX SYSTEM) w/Device KIT   buPROPion (WELLBUTRIN XL) 300 MG 24 hr tablet   Cholecalciferol (VITAMIN D) 50 MCG (2000 UT) CAPS   diclofenac Sodium (VOLTAREN) 1 % GEL   doxycycline (VIBRA-TABS) 100 MG tablet   gentamicin cream (GARAMYCIN) 0.1 %   glucose blood (ONETOUCH VERIO) test strip   hydrochlorothiazide (HYDRODIURIL) 25 MG tablet   insulin degludec (TRESIBA FLEXTOUCH) 200 UNIT/ML FlexTouch Pen   insulin lispro (HUMALOG KWIKPEN) 100 UNIT/ML KwikPen   Insulin Pen Needle 32G X 4 MM MISC   Insulin Syringe-Needle U-100 31G X 15/64" 0.3 ML MISC   Lancets (ONETOUCH ULTRASOFT) lancets   metFORMIN (GLUCOPHAGE) 500 MG tablet   metoprolol succinate (TOPROL-XL) 100 MG 24 hr tablet   metoprolol succinate (TOPROL-XL) 50 MG 24 hr tablet   mirabegron ER (MYRBETRIQ) 50 MG TB24 tablet   nystatin (NYSTATIN) powder   omeprazole (PRILOSEC) 20 MG capsule   oxyCODONE-acetaminophen (PERCOCET/ROXICET) 5-325 MG tablet   OZEMPIC, 0.25 OR 0.5 MG/DOSE, 2 MG/1.5ML SOPN   pregabalin (LYRICA) 200 MG capsule   promethazine (PHENERGAN) 25 MG tablet   rosuvastatin (CRESTOR) 20 MG tablet   sulfamethoxazole-trimethoprim (BACTRIM DS) 800-160 MG tablet   topiramate (TOPAMAX) 50 MG tablet   traZODone (DESYREL) 100 MG tablet   umeclidinium-vilanterol (ANORO ELLIPTA) 62.5-25 MCG/INH AEPB   No current facility-administered medications for this encounter.     Konrad Felix Ward, PA-C [facility]Surgical [facility] [phone]

## 2021-08-20 ENCOUNTER — Telehealth: Payer: Self-pay | Admitting: Dietician

## 2021-08-20 NOTE — Anesthesia Preprocedure Evaluation (Addendum)
Anesthesia Evaluation  Patient identified by MRN, date of birth, ID band Patient awake    Reviewed: Allergy & Precautions, NPO status , Patient's Chart, lab work & pertinent test results  Airway Mallampati: II  TM Distance: >3 FB Neck ROM: Full    Dental no notable dental hx. (+) Edentulous Upper, Edentulous Lower   Pulmonary asthma , Current SmokerPatient did not abstain from smoking.,    Pulmonary exam normal breath sounds clear to auscultation       Cardiovascular hypertension, + Peripheral Vascular Disease  Normal cardiovascular exam Rhythm:Regular Rate:Normal  03/04/21 Echo 1. Left ventricular ejection fraction, by estimation, is 60 to 65%. The  left ventricle has normal function. The left ventricle has no regional  wall motion abnormalities. Left ventricular diastolic parameters are  consistent with Grade I diastolic  dysfunction (impaired relaxation).  2. Right ventricular systolic function is normal. The right ventricular  size is normal.  3. Right atrial size was mildly dilated.  4. The mitral valve is normal in structure. No evidence of mitral valve  regurgitation.  5. The aortic valve is normal in structure. Aortic valve regurgitation is  not visualized.    Neuro/Psych  Neuromuscular disease    GI/Hepatic Neg liver ROS, GERD  Medicated,  Endo/Other  diabetes  Renal/GU Renal disease     Musculoskeletal  (+) Arthritis ,   Abdominal (+) + obese (38.84),   Peds  Hematology Lab Results      Component                Value               Date                      WBC                      9.4                 08/16/2021                HGB                      12.2                08/16/2021                HCT                      37.7                08/16/2021                MCV                      99.7                08/16/2021                PLT                      197                 08/16/2021               Anesthesia Other Findings All: see list  Reproductive/Obstetrics  Anesthesia Physical Anesthesia Plan  ASA: 3  Anesthesia Plan: General   Post-op Pain Management:    Induction: Intravenous  PONV Risk Score and Plan:   Airway Management Planned: LMA  Additional Equipment: None  Intra-op Plan:   Post-operative Plan:   Informed Consent: I have reviewed the patients History and Physical, chart, labs and discussed the procedure including the risks, benefits and alternatives for the proposed anesthesia with the patient or authorized representative who has indicated his/her understanding and acceptance.     Dental advisory given  Plan Discussed with: CRNA and Anesthesiologist  Anesthesia Plan Comments: (LMA )       Anesthesia Quick Evaluation

## 2021-08-20 NOTE — Telephone Encounter (Signed)
Jennifer [name] received her personal CGM and is calling for an appointment to learn how to put it on and use it. She states she is also having surgery tomorrow. She decided to schedule on the same day as her appointment with her primary care doctor,  Dr. Jimmye Norman.

## 2021-08-21 ENCOUNTER — Encounter (HOSPITAL_COMMUNITY): Payer: Self-pay | Admitting: Urology

## 2021-08-21 ENCOUNTER — Ambulatory Visit (HOSPITAL_COMMUNITY): Payer: Medicare Other | Admitting: Physician Assistant

## 2021-08-21 ENCOUNTER — Encounter (HOSPITAL_COMMUNITY)
Admission: RE | Disposition: A | Discharge status: Home / Self Care | Payer: Self-pay | Source: Home / Self Care | Attending: Urology

## 2021-08-21 ENCOUNTER — Ambulatory Visit (HOSPITAL_COMMUNITY)
Admission: RE | Admit: 2021-08-21 | Discharge: 2021-08-21 | Disposition: A | Discharge status: Home / Self Care | Payer: Medicare Other | Attending: Urology | Admitting: Urology

## 2021-08-21 ENCOUNTER — Ambulatory Visit (HOSPITAL_COMMUNITY): Payer: Medicare Other | Admitting: Anesthesiology

## 2021-08-21 DIAGNOSIS — Z7984 Long term (current) use of oral hypoglycemic drugs: Secondary | ICD-10-CM | POA: Insufficient documentation

## 2021-08-21 DIAGNOSIS — Z79899 Other long term (current) drug therapy: Secondary | ICD-10-CM | POA: Diagnosis not present

## 2021-08-21 DIAGNOSIS — F17200 Nicotine dependence, unspecified, uncomplicated: Secondary | ICD-10-CM | POA: Insufficient documentation

## 2021-08-21 DIAGNOSIS — N3281 Overactive bladder: Secondary | ICD-10-CM | POA: Insufficient documentation

## 2021-08-21 DIAGNOSIS — Z79891 Long term (current) use of opiate analgesic: Secondary | ICD-10-CM | POA: Diagnosis not present

## 2021-08-21 DIAGNOSIS — Z885 Allergy status to narcotic agent status: Secondary | ICD-10-CM | POA: Insufficient documentation

## 2021-08-21 DIAGNOSIS — Z91041 Radiographic dye allergy status: Secondary | ICD-10-CM | POA: Insufficient documentation

## 2021-08-21 DIAGNOSIS — Z794 Long term (current) use of insulin: Secondary | ICD-10-CM | POA: Insufficient documentation

## 2021-08-21 HISTORY — PX: BOTOX INJECTION: SHX5754

## 2021-08-21 LAB — GLUCOSE, CAPILLARY
Glucose-Capillary: 89 mg/dL (ref 70–99)
Glucose-Capillary: 75 mg/dL (ref 70–99)

## 2021-08-21 SURGERY — BOTOX INJECTION
Anesthesia: General

## 2021-08-21 MED ORDER — LACTATED RINGERS IV SOLN: INTRAVENOUS | Status: DC

## 2021-08-21 MED ORDER — ONABOTULINUMTOXINA 100 UNITS IJ SOLR
INTRAMUSCULAR | Status: DC | PRN
  Administered 2021-08-21: 100 [IU] via INTRAMUSCULAR

## 2021-08-21 MED ORDER — ONDANSETRON HCL 4 MG/2ML IJ SOLN: INTRAMUSCULAR | Status: DC | PRN

## 2021-08-21 MED ORDER — FENTANYL CITRATE (PF) 100 MCG/2ML IJ SOLN
INTRAMUSCULAR | Status: AC
  Filled 2021-08-21: qty 2

## 2021-08-21 MED ORDER — CEPHALEXIN 500 MG PO CAPS: 500.0000 mg | ORAL_CAPSULE | Freq: Four times a day (QID) | ORAL | 0 refills | Status: AC

## 2021-08-21 MED ORDER — PROPOFOL 10 MG/ML IV BOLUS
INTRAVENOUS | Status: AC
  Filled 2021-08-21: qty 20

## 2021-08-21 MED ORDER — LIDOCAINE 2% (20 MG/ML) 5 ML SYRINGE
INTRAMUSCULAR | Status: AC
  Filled 2021-08-21: qty 5

## 2021-08-21 MED ORDER — MIDAZOLAM HCL 2 MG/2ML IJ SOLN
INTRAMUSCULAR | Status: AC
  Filled 2021-08-21: qty 2

## 2021-08-21 MED ORDER — FENTANYL CITRATE (PF) 100 MCG/2ML IJ SOLN
INTRAMUSCULAR | Status: DC | PRN
  Administered 2021-08-21: 25 ug via INTRAVENOUS

## 2021-08-21 MED ORDER — ORAL CARE MOUTH RINSE: 15.0000 mL | Freq: Once | OROMUCOSAL | Status: AC

## 2021-08-21 MED ORDER — ONDANSETRON HCL 4 MG/2ML IJ SOLN
INTRAMUSCULAR | Status: DC | PRN
  Administered 2021-08-21: 4 mg via INTRAVENOUS

## 2021-08-21 MED ORDER — PROPOFOL 10 MG/ML IV BOLUS
INTRAVENOUS | Status: DC | PRN
  Administered 2021-08-21: 180 mg via INTRAVENOUS

## 2021-08-21 MED ORDER — CHLORHEXIDINE GLUCONATE 0.12 % MT SOLN
15.0000 mL | Freq: Once | OROMUCOSAL | Status: AC
  Administered 2021-08-21: 15 mL via OROMUCOSAL

## 2021-08-21 MED ORDER — SODIUM CHLORIDE 0.9 % IR SOLN
Status: DC | PRN
  Administered 2021-08-21: 3000 mL

## 2021-08-21 MED ORDER — CEFAZOLIN SODIUM-DEXTROSE 2-4 GM/100ML-% IV SOLN
2.0000 g | INTRAVENOUS | Status: AC
  Administered 2021-08-21: 2 g via INTRAVENOUS
  Filled 2021-08-21: qty 100

## 2021-08-21 MED ORDER — ONDANSETRON HCL 4 MG/2ML IJ SOLN
INTRAMUSCULAR | Status: AC
  Filled 2021-08-21: qty 2

## 2021-08-21 MED ORDER — MIDAZOLAM HCL 5 MG/5ML IJ SOLN
INTRAMUSCULAR | Status: DC | PRN
  Administered 2021-08-21: 2 mg via INTRAVENOUS

## 2021-08-21 MED ORDER — LIDOCAINE 2% (20 MG/ML) 5 ML SYRINGE
INTRAMUSCULAR | Status: DC | PRN
  Administered 2021-08-21: 100 mg via INTRAVENOUS

## 2021-08-21 MED ORDER — DEXAMETHASONE SODIUM PHOSPHATE 10 MG/ML IJ SOLN
INTRAMUSCULAR | Status: AC
  Filled 2021-08-21: qty 1

## 2021-08-21 MED ORDER — ONABOTULINUMTOXINA 100 UNITS IJ SOLR
INTRAMUSCULAR | Status: AC
  Filled 2021-08-21: qty 200

## 2021-08-21 MED ORDER — SODIUM CHLORIDE (PF) 0.9 % IJ SOLN
INTRAMUSCULAR | Status: AC
  Filled 2021-08-21: qty 20

## 2021-08-21 SURGICAL SUPPLY — 14 items
BAG URO CATCHER STRL LF (MISCELLANEOUS) ×2 IMPLANT
CLOTH BEACON ORANGE TIMEOUT ST (SAFETY) ×2 IMPLANT
GLOVE SURG ENC MOIS LTX SZ7.5 (GLOVE) ×2 IMPLANT
GOWN STRL REUS W/TWL XL LVL3 (GOWN DISPOSABLE) ×4 IMPLANT
KIT TURNOVER KIT A (KITS) ×2 IMPLANT
MANIFOLD NEPTUNE II (INSTRUMENTS) ×2 IMPLANT
NDL ASPIRATION 22 (NEEDLE) ×1 IMPLANT
NDL SAFETY ECLIPSE 18X1.5 (NEEDLE) IMPLANT
NEEDLE ASPIRATION 22 (NEEDLE) ×2 IMPLANT
NEEDLE HYPO 18GX1.5 SHARP (NEEDLE) ×2
PACK CYSTO (CUSTOM PROCEDURE TRAY) ×2 IMPLANT
SYR CONTROL 10ML LL (SYRINGE) ×1 IMPLANT
TUBING CONNECTING 10 (TUBING) ×1 IMPLANT
WATER STERILE IRR 3000ML UROMA (IV SOLUTION) ×2 IMPLANT

## 2021-08-21 NOTE — Anesthesia Postprocedure Evaluation (Signed)
Anesthesia Post Note  Patient: Jennifer [name]  Procedure(s) Performed: CYSTOSCOPY BOTOX INJECTION     Patient location during evaluation: PACU Anesthesia Type: General Level of consciousness: awake and alert Pain management: pain level controlled Vital Signs Assessment: post-procedure vital signs reviewed and stable Respiratory status: spontaneous breathing, nonlabored ventilation, respiratory function stable and patient connected to nasal cannula oxygen Cardiovascular status: blood pressure returned to baseline and stable Postop Assessment: no apparent nausea or vomiting Anesthetic complications: no   No notable events documented.  Last Vitals:  Vitals:   08/21/21 1215 08/21/21 1230  BP: (!) 150/76 (!) 145/83  Pulse: 72 72  Resp: 12 15  Temp: (!) 36.3 C   SpO2: 92% 97%    Last Pain:  Vitals:   08/21/21 1230  TempSrc:   PainSc: 0-No pain                 Barnet Glasgow

## 2021-08-21 NOTE — Anesthesia Procedure Notes (Signed)
Procedure Name: LMA Insertion Date/Time: 08/21/2021 11:13 AM Performed by: Sharlette Dense, CRNA Patient Re-evaluated:Patient Re-evaluated prior to induction Oxygen Delivery Method: Circle system utilized Preoxygenation: Pre-oxygenation with 100% oxygen Induction Type: IV induction LMA: LMA inserted LMA Size: 4.0 Number of attempts: 1 Placement Confirmation: positive ETCO2 and breath sounds checked- equal and bilateral Tube secured with: Tape Dental Injury: Teeth and Oropharynx as per pre-operative assessment

## 2021-08-21 NOTE — Transfer of Care (Signed)
Immediate Anesthesia Transfer of Care Note  Patient: Jennifer [name]  Procedure(s) Performed: CYSTOSCOPY BOTOX INJECTION  Patient Location: PACU  Anesthesia Type:General  Level of Consciousness: sedated and responds to stimulation  Airway & Oxygen Therapy: Patient Spontanous Breathing and Patient connected to face mask oxygen  Post-op Assessment: Report given to RN and Post -op Vital signs reviewed and stable  Post vital signs: Reviewed  Last Vitals:  Vitals Value Taken Time  BP 124/68 08/21/21 1145  Temp    Pulse 70 08/21/21 1145  Resp 11 08/21/21 1145  SpO2 100 % 08/21/21 1145  Vitals shown include unvalidated device data.  Last Pain:  Vitals:   08/21/21 0837  TempSrc: Oral  PainSc: 7       Patients Stated Pain Goal: 3 ([id number])  Complications: No notable events documented.

## 2021-08-21 NOTE — H&P (Addendum)
CC/HPI: Cc: Urinary incontinence  HPI:  60 year old female who has been having urge urinary incontinence. She has to change her clothes 2-7 times a day. She started taking myrbetriq but this did not improve her symptoms. She denies hematuria or dysuria. Urinalysis today was consistent with possible contamination as evidenced by 10-20 epithelial cells. She did have some microscopic hematuria as a result with 3-10 red blood cell. However, as I stated, this was likely improperly collected. She did mention that without her prosthetic today, she had a difficult time giving the sample. There is Trichomonas present in her urine.   01/11/2019  Patient started Toviaz. She has been doing incredibly better. She was having to change her clothes up to 7 times a day that she is down to wearing one to 2 pads per day and not changing her clothes since she has incontinent pads. She is very pleased with the results.   03/07/2020  Patient ran out of [place]. She continues on Myrbetriq. Even with both of them, her symptoms have worsened. She has severe urgency, frequency every 20 30 minutes, urge urinary incontinence and wears multiple pads per day. She would like to try posterior tibial nerve stimulation. She denies hematuria or dysuria.   07/13/2020: 60 year old female with severe urge and urinary incontinence. She is currently on Myrbetriq as well as ditropan. She was lost to follow-up for PTNS however she would like to pursue this. She is also concerned today for an infection due to increased frequency, increased dysuria and lower suprapubic pressure.   07/31/2020  Patient continues to have significant urge urinary incontinence. She continues on Myrbetriq and digital pan. She wants to try PTNS. She states she is getting set up for this. She has not started yet. She finished Keflex but has persistent severe dysuria and pain when she voids.   01/30/2021  Patient had PTNS. She completed this and has no improvement. She  continues to be plagued by severe urinary urgency, frequency, incontinence. She has failed multiple medications. She failed VESIcare most recently. She is interested in Botox.   06/13/2021  Patient underwent a urodynamics. 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 She did have an elevated postvoid residual but postvoid residual today was less and was 79 cc. She was able to generate a good contraction.   08/21/2021 Patient presents today for intra detrusor Botox.    ALLERGIES: Contrast Dye morphine    MEDICATIONS: Hydrochlorothiazide 25 mg tablet  Metformin Hcl  Myrbetriq  Omeprazole 20 mg capsule,delayed release  Oxybutynin Chloride Er 5 mg tablet, extended release 24 hr TAKE 1 TABLET BY MOUTH EVERY DAY  Amlodipine Besylate 10 mg tablet  Atenolol  Baclofen  Benazepril Hcl 40 mg tablet  Bupropion Xl  Hydroxyzine Hcl  Insulin Syringe  Oxycodone-Acetaminophen 5 mg-325 mg tablet  Pregabalin  Proair Hfa  Rosuvastatin Calcium 20 mg tablet  Trazodone Hcl 100 mg tablet  Tresiba     GU PSH: Complex cystometrogram, w/ void pressure and urethral pressure profile studies, any technique - 05/14/2021 Complex Uroflow - 05/14/2021 Emg surf Electrd - 05/14/2021 Intrabd voidng Press - 05/14/2021     NON-GU PSH: Bilateral Tubal Ligation Breast Surgery Procedure Leg surgery (unspecified), Left Neuroeltrd Stim Post Tibial - 11/23/2020, 11/12/2020, 10/26/2020, 10/12/2020, 10/05/2020, 09/28/2020, 09/14/2020, 09/06/2020, 08/24/2020, 08/17/2020, 08/10/2020, 08/03/2020 Rotator cuff surgery, Right Wrist Arthroscopy/surgery     GU PMH: Urge incontinence - 05/14/2021, - 01/30/2021, - 11/23/2020, - 11/12/2020, - 10/12/2020, - 08/24/2020, - 2019 Urinary Frequency - 05/14/2021, - 01/30/2021, -  11/23/2020, - 11/12/2020, - 10/26/2020, - 10/12/2020, - 10/05/2020, - 09/28/2020, - 09/14/2020, - 09/06/2020, - 08/24/2020, - 2020 Urinary Urgency - 05/14/2021, - 01/30/2021, - 10/26/2020, - 10/12/2020, - 10/05/2020, -  09/28/2020, - 09/14/2020, - 09/06/2020, - 03/07/2020 Dysuria - 07/31/2020 Trichomonal cystitis and urethrtis - 2019    NON-GU PMH: Acute gastric ulcer with hemorrhage Anxiety Arthritis Asthma Atrial Fibrillation COPD Depression Diabetes Type 2 GERD Glaucoma Hypercholesterolemia Hypertension Sleep Apnea    FAMILY HISTORY: 2 sons - Other Diabetes - Mother heart failure - Mother Hypertension - Mother   SOCIAL HISTORY: Marital Status: Divorced Preferred Language: English; Race: Black or African American Current Smoking Status: Patient smokes.   Tobacco Use Assessment Completed: Used Tobacco in last 30 days? Drinks 1 caffeinated drink per day.    REVIEW OF SYSTEMS:    GU Review Female:   Patient denies get up at night to urinate, leakage of urine, being pregnant, trouble starting your stream, stream starts and stops, burning /pain with urination, have to strain to urinate, hard to postpone urination, and frequent urination.  Gastrointestinal (Upper):   Patient denies nausea, vomiting, and indigestion/ heartburn.  Gastrointestinal (Lower):   Patient denies diarrhea and constipation.  Constitutional:   Patient denies fever, night sweats, weight loss, and fatigue.  Skin:   Patient denies skin rash/ lesion and itching.  Eyes:   Patient denies blurred vision and double vision.  Ears/ Nose/ Throat:   Patient denies sore throat and sinus problems.  Hematologic/Lymphatic:   Patient denies swollen glands and easy bruising.  Cardiovascular:   Patient denies leg swelling and chest pains.  Respiratory:   Patient denies cough and shortness of breath.  Endocrine:   Patient denies excessive thirst.  Musculoskeletal:   Patient denies back pain and joint pain.  Neurological:   Patient denies headaches and dizziness.  Psychologic:   Patient denies depression and anxiety.   VITAL SIGNS: None   Physical exam: No acute distress Obese female Unlabored respiration  Complexity of Data:  Source  Of History:  Patient  Records Review:   Previous Patient Records  Urodynamics Review:   Review Bladder Scan, Review Urodynamics Tests   PROCEDURES:         PVR Ultrasound - [id number]  Scanned Volume: 79 cc   ASSESSMENT:      ICD-10 Details  1 GU:   Urge incontinence - N39.41 Chronic, Stable  2   Urinary Frequency - R35.0 Chronic, Stable  3   Urinary Urgency - R39.15 Chronic, Stable     PLAN:     Proceed with intra detrusor Botox, 100 units

## 2021-08-21 NOTE — Op Note (Signed)
Preoperative Diagnosis: Overactive bladder  Postoperative Diagnosis:  Same  Procedure(s) Performed:   - Cystourethroscopy - Intravesical injection of Onabotulinum toxin   Teaching Surgeon:  Link Snuffer, MD  Resident Surgeon:  Reola Mosher, MD  Assistant(s):  None  Anesthesia:  General  Fluids:  See anesthesia record  Estimated blood loss:  1cc  Specimens:  None  Drains:  None  Complications:  None  Indications: 61 y.o. patient with a history of refractory OAB. Risks & benefits of the procedure discussed with the patient, who wishes to proceed.  Findings:   - Normal cystourethroscopy - Successful injection of 100 units of Onabotulinum toxin throughout the bladder wall, excepting the trigone  Description:  The patient was correctly identified in the preop holding area where written informed consent as well potential risk and complication reviewed. The patient agreed. They were brought back to the operative suite where a preinduction timeout was performed. Once correct information was verified, general anesthesia was induced. They were then gently placed into dorsal lithotomy position with SCDs in place for VTE prophylaxis. They were prepped and draped in the usual sterile fashion and given appropriate preoperative antibiotics. A second timeout was then performed.   We inserted a 22F rigid cystoscope per urethra with copious lubrication and normal saline irrigation running. This demonstrated findings as described above.    We then utilized a small transurethral injection needle and inserted 1 mL of Onabotulinum solution in 10 sites intra-vesically, avoiding the bladder trigone, for a total of 100 Units.  The bladder was emptied and all instrumentation was removed. The patient was woken up from anesthesia and taken to the recovery unit for routine postoperative care.   Post Op Plan:   1. Discharge from PACU after voiding x1  Attestation:  Dr. Gloriann Loan was present for the entire  procedure.    Reola Mosher, MD [facility] Resident, [facility]

## 2021-08-22 ENCOUNTER — Encounter (HOSPITAL_COMMUNITY): Payer: Self-pay | Admitting: Urology

## 2021-08-22 ENCOUNTER — Telehealth: Payer: Self-pay | Admitting: *Deleted

## 2021-08-22 NOTE — Telephone Encounter (Signed)
Patient is calling for a refill of her medication(gentamicin cream-0.1%). Her wound center appointment is not until 09/03/21. Please advise.

## 2021-08-29 ENCOUNTER — Telehealth: Payer: Self-pay | Admitting: *Deleted

## 2021-08-29 NOTE — Telephone Encounter (Signed)
Completed Practitioner Standard Written Order, and Mobility Eval, for power w/c faxed to Cynthis Lyons at [facility] [phone]. Fax confirmation receipt received.

## 2021-09-03 ENCOUNTER — Encounter (HOSPITAL_BASED_OUTPATIENT_CLINIC_OR_DEPARTMENT_OTHER): Payer: Medicare Other | Attending: Internal Medicine | Admitting: Internal Medicine

## 2021-09-03 ENCOUNTER — Other Ambulatory Visit: Payer: Self-pay

## 2021-09-03 DIAGNOSIS — E11621 Type 2 diabetes mellitus with foot ulcer: Secondary | ICD-10-CM | POA: Diagnosis present

## 2021-09-03 DIAGNOSIS — Z8249 Family history of ischemic heart disease and other diseases of the circulatory system: Secondary | ICD-10-CM | POA: Diagnosis not present

## 2021-09-03 DIAGNOSIS — Z833 Family history of diabetes mellitus: Secondary | ICD-10-CM | POA: Diagnosis not present

## 2021-09-03 DIAGNOSIS — F17200 Nicotine dependence, unspecified, uncomplicated: Secondary | ICD-10-CM | POA: Insufficient documentation

## 2021-09-03 DIAGNOSIS — I5032 Chronic diastolic (congestive) heart failure: Secondary | ICD-10-CM | POA: Diagnosis not present

## 2021-09-03 DIAGNOSIS — Z89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): Secondary | ICD-10-CM

## 2021-09-03 DIAGNOSIS — I110 Hypertensive heart disease with heart failure: Secondary | ICD-10-CM | POA: Insufficient documentation

## 2021-09-03 DIAGNOSIS — Z794 Long term (current) use of insulin: Secondary | ICD-10-CM | POA: Insufficient documentation

## 2021-09-03 DIAGNOSIS — I739 Peripheral vascular disease, unspecified: Secondary | ICD-10-CM

## 2021-09-03 DIAGNOSIS — Z79891 Long term (current) use of opiate analgesic: Secondary | ICD-10-CM | POA: Insufficient documentation

## 2021-09-03 DIAGNOSIS — S91301A Unspecified open wound, right foot, initial encounter: Secondary | ICD-10-CM

## 2021-09-03 DIAGNOSIS — E1140 Type 2 diabetes mellitus with diabetic neuropathy, unspecified: Secondary | ICD-10-CM | POA: Insufficient documentation

## 2021-09-03 DIAGNOSIS — E1151 Type 2 diabetes mellitus with diabetic peripheral angiopathy without gangrene: Secondary | ICD-10-CM | POA: Diagnosis not present

## 2021-09-03 DIAGNOSIS — G40909 Epilepsy, unspecified, not intractable, without status epilepticus: Secondary | ICD-10-CM | POA: Insufficient documentation

## 2021-09-03 NOTE — Progress Notes (Signed)
AMY-LEE, MINHAS ([id number]) Visit Report for 09/03/2021 Chief Complaint Document Details Patient Name: Date of Service: IYANUOLUWA, ROIGER 09/03/2021 1:15 PM Medical Record Number: [id number] Patient Account Number: [id number] Date of Birth/Sex: Treating RN: Jan 15, 1961 (60 y.o. [name] Primary Care Provider: Lyman Bishop MS, Almyra Free Other Clinician: Referring Provider: Treating Provider/Extender: Daiva Eves in Treatment: 0 Information Obtained from: Patient Chief Complaint 09/03/2021: Patient presents for fourth right toe wound Electronic Signature(s) Signed: 09/03/2021 1:52:08 PM By: Kalman Shan DO Entered By: Kalman Shan on 09/03/2021 13:41:33 -------------------------------------------------------------------------------- Debridement Details Patient Name: Date of Service: [name] 09/03/2021 1:15 PM Medical Record Number: [id number] Patient Account Number: [id number] Date of Birth/Sex: Treating RN: 07-03-1961 (60 y.o. [name] Primary Care Provider: Lyman Bishop MS, Almyra Free Other Clinician: Referring Provider: Treating Provider/Extender: Daiva Eves in Treatment: 0 Debridement Performed for Assessment: Wound #22 Right T Fourth oe Performed By: Clinician Baruch Gouty, RN Debridement Type: Chemical/Enzymatic/Mechanical Agent Used: gauze and wound cleanser Severity of Tissue Pre Debridement: Bone involvement without necrosis Level of Consciousness (Pre-procedure): Awake and Alert Pre-procedure Verification/Time Out No Taken: Bleeding: None Response to Treatment: Procedure was tolerated well Level of Consciousness (Post- Awake and Alert procedure): Post Debridement Measurements of Total Wound Length: (cm) 0.7 Width: (cm) 0.7 Depth: (cm) 0.2 Volume: (cm) 0.077 Character of Wound/Ulcer Post Debridement: Stable Severity of Tissue Post Debridement: Bone involvement without necrosis Post Procedure  Diagnosis Same as Pre-procedure Electronic Signature(s) Signed: 09/03/2021 1:52:08 PM By: Kalman Shan DO Signed: 09/03/2021 5:09:46 PM By: Deon Pilling RN, BSN Entered By: Deon Pilling on 09/03/2021 13:23:55 -------------------------------------------------------------------------------- HPI Details Patient Name: Date of Service: Barbette Hair, Waynette L. 09/03/2021 1:15 PM Medical Record Number: [id number] Patient Account Number: [id number] Date of Birth/Sex: Treating RN: [birth year] (60 y.o. [name], Tammi Klippel Primary Care Provider: Lyman Bishop MS, Almyra Free Other Clinician: Referring Provider: Treating Provider/Extender: Daiva Eves in Treatment: 0 History of Present Illness Location: right fifth toe Quality: admits to sharp pain with digital inspection and periwound palpation Duration: healed September 2017, reopened sometime prior to Thanksgiving but unaware until Thanksgiving Timing: pain is primarily with palpation and digital inspection Context: unclear causative factor HPI Description: Admission 9/13 Ms. Heidi Dach is a 60 year old female with a past medical history of insulin-dependent type 2 diabetes, left lower extremity BKA, right partial fifth ray amputation that presents today for a wound on her fourth right toe. On 07/08/2021 patient was evaluated by Dr. Amalia Hailey for the toe wound. She was started on doxycycline and gentamicin cream at that time. She has been following closely for podiatry for this issue. She had an x-ray of the foot on 8/8 that did not show erosion or cortical destruction concerning for osteomyelitis. Patient has been using gentamicin cream with daily dressing changes. She currently denies signs of infection. She was referred to [place] by Dr. Amalia Hailey office. Electronic Signature(s) Signed: 09/03/2021 1:52:08 PM By: Kalman Shan DO Entered By: Kalman Shan on 09/03/2021  13:42:04 -------------------------------------------------------------------------------- Physical Exam Details Patient Name: Date of Service: YASIRA, DEMANCHE 09/03/2021 1:15 PM Medical Record Number: [id number] Patient Account Number: [id number] Date of Birth/Sex: Treating RN: 16-Jan-1961 (60 y.o. [name] Primary Care Provider: Lyman Bishop MS, Almyra Free Other Clinician: Referring Provider: Treating Provider/Extender: Daiva Eves in Treatment: 0 Constitutional respirations regular, non-labored and within target range for patient.Marland Kitchen Psychiatric pleasant and cooperative. Notes Right foot: T the dorsal aspect of the fourth toe there is an  open wound with granulation tissue that probes to bone. No obvious signs of soft tissue infection. o Electronic Signature(s) Signed: 09/03/2021 1:52:08 PM By: Kalman Shan DO Entered By: Kalman Shan on 09/03/2021 13:42:57 -------------------------------------------------------------------------------- Physician Orders Details Patient Name: Date of Service: FA ARRIYAH, CHEEMA 09/03/2021 1:15 PM Medical Record Number: [id number] Patient Account Number: [id number] Date of Birth/Sex: Treating RN: 1961-07-13 (60 y.o. [name], Meta.Reding Primary Care Provider: Lyman Bishop MS, Almyra Free Other Clinician: Referring Provider: Treating Provider/Extender: Daiva Eves in Treatment: 0 Verbal / Phone Orders: No Diagnosis Coding ICD-10 Coding Code Description E11.621 Type 2 diabetes mellitus with foot ulcer S91.301A Unspecified open wound, right foot, initial encounter I73.9 Peripheral vascular disease, unspecified [id number] Chronic diastolic (congestive) heart failure Z89.512 Acquired absence of left leg below knee Z89.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) E11.59 Type 2 diabetes mellitus with other circulatory complications [id number] Long term (current) use of opiate analgesic Follow-up  Appointments ppointment in 1 week. - Dr. Heber Erie Return A Someone will call you with to schedule MRI for right foot and someone would call you from [facility] office to schedule arterial study. Bathing/ Shower/ Hygiene May shower and wash wound with soap and water. - with dressing changes. Edema Control - Lymphedema / SCD / Other Elevate legs to the level of the heart or above for 30 minutes daily and/or when sitting, a frequency of: - 3-4 times throughout the day. Avoid standing for long periods of time. Moisturize legs daily. - both legs every night before bed. Compression stocking or Garment 20-30 mm/Hg pressure to: Off-Loading Open toe surgical shoe to: - right foot. ensure no pressure or rubbing to right foot 4th wound. Wound Treatment Wound #22 - T Fourth oe Wound Laterality: Right Cleanser: Soap and Water 1 x Per Day/30 Days Discharge Instructions: May shower and wash wound with dial antibacterial soap and water prior to dressing change. Cleanser: Wound Cleanser (DME) (Generic) 1 x Per Day/30 Days Discharge Instructions: Cleanse the wound with wound cleanser prior to applying a clean dressing using gauze sponges, not tissue or cotton balls. Topical: Gentamicin 1 x Per Day/30 Days Discharge Instructions: As directed by physician Prim Dressing: Hydrofera Blue Ready Foam, 2.5 x2.5 in (Generic) 1 x Per Day/30 Days ary Discharge Instructions: ***APPLY GENTAMICIN OINTMENT UNDER THE HYDROFERA BLUE.***Apply to wound bed as instructed Secondary Dressing: Woven Gauze Sponges 2x2 in (DME) (Generic) 1 x Per Day/30 Days Discharge Instructions: Apply over primary dressing as directed. Secured With: 87M Medipore H Soft Cloth Surgical Tape, 2x2 (in/yd) (DME) (Generic) 1 x Per Day/30 Days Discharge Instructions: Secure dressing with tape as directed. Radiology MRI, right foot 4th toe without contrast - MRI right foot 4th toe WITHOUT CONTRAST related to non-healing diabetic foot ulcer bone  exposed looking for infection to rule out osteomyelitis. CPT code - (ICD10 E11.621 - Type 2 diabetes mellitus with foot ulcer) Services and Therapies rterial Studies- Unilateral right leg - [facility] for Arterial study with ABIs and TBIs of right leg related to foot and leg edema. ICD 10 code A I73.9, I89.0. Electronic Signature(s) Signed: 09/03/2021 1:52:08 PM By: Kalman Shan DO Entered By: Kalman Shan on 09/03/2021 13:45:28 Prescription 09/03/2021 -------------------------------------------------------------------------------- Phil Dopp L. Kalman Shan DO Patient Name: Provider: April 08, 1961 [id number] Date of Birth: NPI#: [id number] Sex: DEA #: [phone] Phone #: License #: [place] Patient Address: [address] [place], [place]  [address] [place], [place] [address] [phone] Allergies Iodinated Contrast- Oral and IV Dye; morphine; benazepril; pineapple; Abilify Provider's Orders MRI, right foot 4th toe without contrast - ICD10: E11.621 - MRI right foot 4th toe WITHOUT CONTRAST related to non-healing diabetic foot ulcer bone exposed looking for infection to rule out osteomyelitis. CPT code Hand Signature: Date(s): Prescription 09/03/2021 Dorina Hoyer, Marilou L. Kalman Shan DO Patient Name: Provider: December 23, 1960 [id number] Date of Birth: NPI#: [id number] Sex: DEA #: [phone] Phone #: License #: [place] Patient Address: [place] [address] [place], [place] [address] [place], [place] [address] [phone] Allergies Iodinated Contrast- Oral and IV Dye; morphine; benazepril; pineapple; Abilify Provider's Orders rterial Studies- Unilateral right leg - [facility] for Arterial study with ABIs and TBIs of right leg related to foot and leg edema. ICD 10 code A I73.9,  I89.0. Hand Signature: Date(s): Electronic Signature(s) Signed: 09/03/2021 1:52:08 PM By: Kalman Shan DO Entered By: Kalman Shan on 09/03/2021 13:45:29 -------------------------------------------------------------------------------- Problem List Details Patient Name: Date of Service: [name]. 09/03/2021 1:15 PM Medical Record Number: [id number] Patient Account Number: [id number] Date of Birth/Sex: Treating RN: 07-Jun-1961 (60 y.o. [name] Primary Care Provider: Lyman Bishop MS, Almyra Free Other Clinician: Referring Provider: Treating Provider/Extender: Daiva Eves in Treatment: 0 Active Problems ICD-10 Encounter Code Description Active Date MDM Diagnosis E11.621 Type 2 diabetes mellitus with foot ulcer 09/03/2021 No Yes S91.301A Unspecified open wound, right foot, initial encounter 09/03/2021 No Yes I73.9 Peripheral vascular disease, unspecified 09/03/2021 No Yes [id number] Chronic diastolic (congestive) heart failure 09/03/2021 No Yes Z89.512 Acquired absence of left leg below knee 09/03/2021 No Yes Z89.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) 09/03/2021 No Yes E11.59 Type 2 diabetes mellitus with other circulatory complications [id number] No Yes Z79.891 Long term (current) use of opiate analgesic 09/03/2021 No Yes Inactive Problems Resolved Problems Electronic Signature(s) Signed: 09/03/2021 1:52:08 PM By: Kalman Shan DO Entered By: Kalman Shan on 09/03/2021 13:41:12 -------------------------------------------------------------------------------- Progress Note Details Patient Name: Date of Service: [name] 09/03/2021 1:15 PM Medical Record Number: [id number] Patient Account Number: [id number] Date of Birth/Sex: Treating RN: 1961/11/18 (60 y.o. [name], Meta.Reding Primary Care Provider: Lyman Bishop MS, Almyra Free Other Clinician: Referring Provider: Treating Provider/Extender: Daiva Eves in Treatment:  0 Subjective Chief Complaint Information obtained from Patient 09/03/2021: Patient presents for fourth right toe wound History of Present Illness (HPI) The following HPI elements were documented for the patient's wound: Location: right fifth toe Quality: admits to sharp pain with digital inspection and periwound palpation Duration: healed September 2017, reopened sometime prior to Thanksgiving but unaware until Thanksgiving Timing: pain is primarily with palpation and digital inspection Context: unclear causative factor Admission 9/13 Ms. Heidi Dach is a 60 year old female with a past medical history of insulin-dependent type 2 diabetes, left lower extremity BKA, right partial fifth ray amputation that presents today for a wound on her fourth right toe. On 07/08/2021 patient was evaluated by Dr. Amalia Hailey for the toe wound. She was started on doxycycline and gentamicin cream at that time. She has been following closely for podiatry for this issue. She had an x-ray of the foot on 8/8 that did not show erosion or cortical destruction concerning for osteomyelitis. Patient has been using gentamicin cream with daily dressing changes. She currently denies signs of infection. She was referred to [place] by Dr. Amalia Hailey office. Patient History Information obtained from Patient. Allergies  morphine (Severity: Moderate, Reaction: hives), Iodinated Contrast- Oral and IV Dye (Severity: Severe, Reaction: kidney damage), Abilify (Severity: Mild, Reaction: urinary frequency), benazepril (Severity: Moderate, Reaction: swelling), pineapple (Severity: Moderate, Reaction: swelling) Family History Diabetes - Mother, Heart Disease - Mother, Hypertension - Mother, Seizures - Siblings, Stroke - Siblings, No family history of Cancer, Hereditary Spherocytosis, Kidney Disease, Lung Disease, Thyroid Problems, Tuberculosis. Social History Current every day smoker, Marital Status - Divorced, Alcohol Use - Never, Drug Use - No  History, Caffeine Use - Moderate. 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 Denies history of Optic Neuritis Ear/Nose/Mouth/Throat Patient has history of Chronic sinus problems/congestion - Flonase Denies history of Middle ear problems Hematologic/Lymphatic Patient has history of Anemia Denies history of Hemophilia, Human Immunodeficiency Virus, Lymphedema, Sickle Cell Disease Respiratory Patient has history of Asthma, Chronic Obstructive Pulmonary Disease (COPD) Denies history of Aspiration Cardiovascular Patient has history of Congestive Heart Failure - chronic diastolic, Hypertension, Peripheral Arterial Disease, 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 Denies history of Angina, Arrhythmia, Coronary Artery Disease, Deep Vein Thrombosis, Hypotension, Myocardial Infarction, Phlebitis, Vasculitis Gastrointestinal Denies history of Cirrhosis , Colitis, Crohnoos, Hepatitis A, Hepatitis B, Hepatitis C Endocrine Patient has history of Type II Diabetes - uncontrolled w/ side effects Denies history of Type I Diabetes Genitourinary Denies history of End Stage Renal Disease Immunological Denies history of Lupus Erythematosus, Raynaudoos, Scleroderma Integumentary (Skin) Denies history of History of Burn Musculoskeletal Patient has history of Rheumatoid Arthritis, Osteoarthritis - spine, Osteomyelitis - hx left foot Denies history of Gout Neurologic Patient has history of Neuropathy, Seizure Disorder Denies history of Dementia, Quadriplegia, Paraplegia Oncologic Denies history of Received Chemotherapy, Received Radiation Psychiatric Denies history of Anorexia/bulimia, Confinement Anxiety Hospitalization/Surgery History - left leg amputated. - COPD, PNA. 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 Medical A Surgical History Notes nd Constitutional Symptoms (General Health) chronic insomnia , opioid dependence , gait abnormality , severe obesity , s/p BKA  , Eyes diabetic retinopathy Ear/Nose/Mouth/Throat oral mucosa lesions Hematologic/Lymphatic thrombocytopenia Cardiovascular hyperlipidemia , infective endocarditis , aortic atherosclerosis , Gastrointestinal GERD Integumentary (Skin) foot ulcer Neurologic childhood seizures Hx Bells Palsy Psychiatric depression Review of Systems (ROS) Constitutional Symptoms (General Health) Denies complaints or symptoms of Fatigue, Fever, Chills, Marked Weight Change. Eyes Complains or has symptoms of Glasses / Contacts. Denies complaints or symptoms of Dry Eyes, Vision Changes. Ear/Nose/Mouth/Throat Denies complaints or symptoms of Chronic sinus problems or rhinitis. Integumentary (Skin) Complains or has symptoms of Wounds - right foot 4th toe.. Objective Constitutional respirations regular, non-labored and within target range for patient.. Vitals Time Taken: 12:50 PM, Height: 67 in, Source: Stated, Weight: 240 lbs, Source: Stated, BMI: 37.6, Temperature: 98.1 F, Pulse: 86 bpm, Respiratory Rate: 20 breaths/min, Blood Pressure: 144/82 mmHg, Capillary Blood Glucose: 158 mg/dl. Psychiatric pleasant and cooperative. General Notes: Right foot: T the dorsal aspect of the fourth toe there is an open wound with granulation tissue that probes to bone. No obvious signs of soft o tissue infection. Integumentary (Hair, Skin) Wound #22 status is Open. Original cause of wound was Gradually Appeared. The date acquired was: 06/21/2021. The wound is located on the Right T Fourth. oe The wound measures 0.7cm length x 0.7cm width x 0.2cm depth; 0.385cm^2 area and 0.077cm^3 volume. There is bone and Fat Layer (Subcutaneous Tissue) exposed. There is no tunneling or undermining noted. There is a medium amount of serosanguineous drainage noted. The wound margin is distinct with the outline attached to the wound base. There  is medium (34-66%) red, friable granulation within the wound bed. There is a medium  (34-66%) amount of necrotic tissue within the wound bed including Adherent Slough. 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 She has been using gentamicin cream on this. I recommended Hydrofera Blue with the gentamicin cream daily. I am able to probe to bone on exam. This is concerning for osteomyelitis.I recommended obtaining an MRI without contrast since she reports an allergy to contrast. Her right ABI was 0.9 I would like to obtain formal ABIs with TBI's. If this does show osteomyelitis she will likely need A fourth toe amputation. As of now there is no signs of soft tissue infection. I did recommend the patient call if she notices signs of redness or increased warmth to the area. She would also need to visit the ED if symptoms worsen. She expressed understanding. 47 minutes was spent on the encounter including face-to-face, EMR review and coordination of care Procedures Wound #22 Pre-procedure diagnosis of Wound #22 is a Diabetic Wound/Ulcer of the Lower Extremity located on the Right T Fourth .Severity of Tissue Pre Debridement oe is: Bone involvement without necrosis. There was a Chemical/Enzymatic/Mechanical debridement performed by BBaruch Gouty RN.. Other agent used was gauze and wound cleanser. There was no bleeding. The procedure was tolerated well. Post Debridement Measurements: 0.7cm length x 0.7cm width x 0.2cm depth; 0.077cm^3 volume. Character of Wound/Ulcer Post Debridement is stable. Severity of Tissue Post Debridement is: Bone  involvement without necrosis. Post procedure Diagnosis Wound #22: Same as Pre-Procedure Plan Follow-up Appointments: Return Appointment in 1 week. - Dr. HHeber CarolinaSomeone will call you with to schedule MRI for right foot and someone would call you from [facility] office to schedule arterial study. Bathing/ Shower/ Hygiene: May shower and wash wound with soap and water. - with dressing changes. Edema Control - Lymphedema / SCD / Other: Elevate legs to the level of the heart or above for 30 minutes daily and/or when sitting, a frequency of: - 3-4 times throughout the day. Avoid standing for long periods of time. Moisturize legs daily. - both legs every night before bed. Compression stocking or Garment 20-30 mm/Hg pressure to: Off-Loading: Open toe surgical shoe to: - right foot. ensure no pressure or rubbing to right foot 4th wound. Radiology ordered were: MRI, right foot 4th toe without contrast - MRI right foot 4th toe WITHOUT CONTRAST related to non-healing diabetic foot ulcer bone exposed looking for infection to rule out osteomyelitis. CPT code Services and Therapies ordered were: Arterial Studies- Unilateral right leg - [facility] for Arterial study with ABIs and TBIs of right leg related to foot and leg edema. ICD 10 code I73.9, I89.0. WOUND #22: - T Fourth Wound Laterality: Right oe Cleanser: Soap and Water 1 x Per Day/30 Days Discharge Instructions: May shower and wash wound with dial antibacterial soap and water prior to dressing change. Cleanser: Wound Cleanser (DME) (Generic) 1 x Per Day/30 Days Discharge Instructions: Cleanse the wound with wound cleanser prior to applying a clean dressing using gauze sponges, not tissue or cotton balls. Topical: Gentamicin 1 x Per Day/30 Days Discharge Instructions:  As directed by physician Prim Dressing: Hydrofera Blue Ready Foam, 2.5 x2.5 in (Generic) 1 x Per Day/30 Days ary Discharge Instructions: ***APPLY GENTAMICIN OINTMENT  UNDER THE HYDROFERA BLUE.***Apply to wound bed as instructed Secondary Dressing: Woven Gauze Sponges 2x2 in (DME) (Generic) 1 x Per Day/30 Days Discharge Instructions: Apply over primary dressing as directed. Secured With: 65M Medipore H Soft Cloth Surgical T ape, 2x2 (in/yd) (DME) (Generic) 1 x Per Day/30 Days Discharge Instructions: Secure dressing with tape as directed. 1. Hydrofera Blue and gentamicin cream daily 2. MRI without contrast 3. Follow-up in 1 week Electronic Signature(s) Signed: 09/03/2021 1:52:08 PM By: Kalman Shan DO Entered By: Kalman Shan on 09/03/2021 13:51:37 -------------------------------------------------------------------------------- HxROS Details Patient Name: Date of Service: [name] 09/03/2021 1:15 PM Medical Record Number: [id number] Patient Account Number: [id number] Date of Birth/Sex: Treating RN: 16-Aug-1961 (60 y.o. 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 Negative for: Angina; Arrhythmia; Coronary Artery Disease; Deep Vein Thrombosis; Hypotension; Myocardial Infarction; Phlebitis; Vasculitis Past Medical History Notes: hyperlipidemia , infective endocarditis , aortic atherosclerosis , Gastrointestinal Medical History: Negative for: Cirrhosis ; Colitis; Crohns; Hepatitis A; Hepatitis B; Hepatitis C Past Medical History Notes: GERD Endocrine Medical History: Positive for: Type II Diabetes - uncontrolled w/ side effects Negative for: Type I Diabetes Time with diabetes: 1993 Treated with: Insulin Blood sugar tested every day: Yes Tested : daily Genitourinary Medical History: Negative for: End Stage Renal Disease Immunological Medical History: Negative for: Lupus Erythematosus; Raynauds; Scleroderma Musculoskeletal Medical History: Positive for: Rheumatoid Arthritis; Osteoarthritis - spine; Osteomyelitis - hx left foot Negative for: Gout Neurologic Medical History: Positive for: Neuropathy; Seizure Disorder Negative for: Dementia; Quadriplegia; Paraplegia Past Medical History Notes: childhood seizures Hx Bells Palsy Oncologic Medical History: Negative for: Received Chemotherapy; Received Radiation Psychiatric Medical History: Negative for: Anorexia/bulimia; Confinement Anxiety Past Medical History  Notes: depression HBO Extended History Items Ear/Nose/Mouth/Throat: Eyes: Eyes: Chronic sinus Cataracts Glaucoma problems/congestion Immunizations Pneumococcal Vaccine: Received Pneumococcal Vaccination: Yes Received Pneumococcal Vaccination On or After 60th Birthday: No Immunization Notes: tetanus shot in 2014 Implantable Devices None Hospitalization / Surgery History Type of Hospitalization/Surgery left leg amputated COPD, 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 Family and Social History Cancer: No; Diabetes: Yes - Mother; Heart Disease: Yes - Mother; Hereditary Spherocytosis: No; Hypertension: Yes -  Mother; Kidney Disease: No; Lung Disease: No; Seizures: Yes - Siblings; Stroke: Yes - Siblings; Thyroid Problems: No; Tuberculosis: No; Current every day smoker; Marital Status - Divorced; Alcohol Use: Never; Drug Use: No History; Caffeine Use: Moderate; Financial Concerns: No; Food, Clothing or Shelter Needs: No; Support System Lacking: No; Transportation Concerns: No Electronic Signature(s) Signed: 09/03/2021 1:52:08 PM By: Kalman Shan DO Signed: 09/03/2021 5:09:46 PM By: Deon Pilling RN, BSN Entered By: Deon Pilling on 09/03/2021 12:52:46 -------------------------------------------------------------------------------- SuperBill Details Patient Name: Date of Service: [name] 09/03/2021 Medical Record Number: [id number] Patient Account Number: [id number] Date of Birth/Sex: Treating RN: May 31, 1961 (60 y.o. 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 [id number] Long term (current) use of opiate analgesic Facility Procedures CPT4 Code: [id number] Description: [facility] VISIT-LEV 3 EST PT Modifier: Quantity: 1 CPT4 Code: [id number] Description: [id number] - DEBRIDE W/O ANES NON SELECT Modifier: Quantity: 1 Physician Procedures : CPT4 Code Description Modifier [id number] - WC PHYS LEVEL 4 - NEW PT ICD-10 Diagnosis Description E11.621 Type 2 diabetes mellitus with foot ulcer S91.301A Unspecified open wound, right foot, initial encounter I73.9 Peripheral vascular disease,  unspecified Z89.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

## 2021-09-03 NOTE — Progress Notes (Signed)
Jennifer [name], Jennifer [name] ([id number]) Visit Report for 09/03/2021 Allergy List Details Patient Name: Date of Service: Jennifer [name], Jennifer [name] 09/03/2021 1:15 PM Medical Record Number: [id number] Patient Account Number: [id number] Date of Birth/Sex: Treating RN: 05/04/1961 (60 y.o. [name] Primary Care Omaria Plunk: Lyman Bishop MS, Almyra Free Other Clinician: Referring Yailin Biederman: Treating Lucus Lambertson/Extender: Candis Schatz Weeks in Treatment: 0 Allergies Active Allergies morphine Reaction: hives Severity: Moderate Iodinated Contrast- Oral and IV Dye Reaction: kidney damage Severity: Severe Abilify Reaction: urinary frequency Severity: Mild benazepril Reaction: swelling Severity: Moderate pineapple Reaction: swelling Severity: Moderate Allergy Notes Electronic Signature(s) Signed: 09/03/2021 5:09:46 PM By: Deon Pilling RN, BSN Entered By: Deon Pilling on 09/03/2021 12:55:51 -------------------------------------------------------------------------------- Arrival Information Details Patient Name: Date of Service: Jennifer Reader L. 09/03/2021 1:15 PM Medical Record Number: [id number] Patient Account Number: [id number] Date of Birth/Sex: Treating RN: 1961-11-09 (60 y.o. [name], Meta.Reding Primary Care Macaiah Mangal: Lyman Bishop MS, Almyra Free Other Clinician: Referring Saraiyah Hemminger: Treating Jasmaine Rochel/Extender: Daiva Eves in Treatment: 0 Visit Information Patient Arrived: Wheel Chair Arrival Time: 12:48 Accompanied By: self Transfer Assistance: None Patient Identification Verified: Yes Secondary Verification Process Completed: Yes Patient Requires Transmission-Based Precautions: No Patient Has Alerts: No History Since Last Visit Added or deleted any medications: No Any new allergies or adverse reactions: No Had a fall or experienced change in activities of daily living that may affect risk of falls: No Signs or symptoms of abuse/neglect since last visito  No Hospitalized since last visit: No Implantable device outside of the clinic excluding Implantable device outside of the clinic excluding cellular tissue based products placed in the center since last visit: No No Has Dressing in Place as Prescribed: Yes Pain Present Now: No Notes Per patient Dr. Amalia Hailey Podiatry sent patient for evaluation of right foot 4th toe wound. Electronic Signature(s) Signed: 09/03/2021 5:09:46 PM By: Deon Pilling RN, BSN Entered By: Deon Pilling on 09/03/2021 12:50:08 -------------------------------------------------------------------------------- Clinic Level of Care Assessment Details Patient Name: Date of Service: Jennifer [name], Jennifer [name] 09/03/2021 1:15 PM Medical Record Number: [id number] Patient Account Number: [id number] Date of Birth/Sex: Treating RN: May 13, 1961 (60 y.o. [name] Primary Care Jordana Dugue: Lyman Bishop MS, Almyra Free Other Clinician: Referring Dam Ashraf: Treating Marnesha Gagen/Extender: Daiva Eves in Treatment: 0 Clinic Level of Care Assessment Items TOOL 1 Quantity Score X- 1 0 Use when EandM and Procedure is performed on INITIAL visit ASSESSMENTS - Nursing Assessment / Reassessment X- 1 20 General Physical Exam (combine w/ comprehensive assessment (listed just below) when performed on new pt. evals) X- 1 25 Comprehensive Assessment (HX, ROS, Risk Assessments, Wounds Hx, etc.) ASSESSMENTS - Wound and Skin Assessment / Reassessment X- 1 10 Dermatologic / Skin Assessment (not related to wound area) ASSESSMENTS - Ostomy and/or Continence Assessment and Care _0  - 0 Incontinence Assessment and Management _1  - 0 Ostomy Care Assessment and Management (repouching, etc.) PROCESS - Coordination of Care X - Simple Patient / Family Education for ongoing care 1 15 _2  - 0 Complex (extensive) Patient / Family Education for ongoing care X- 1 10 Staff obtains Programmer, systems, Records, T Results / Process Orders est _3  - 0 Staff telephones  HHA, Nursing Homes / Clarify orders / etc _4  - 0 Routine Transfer to another Facility (non-emergent condition) _5  - 0 Routine Hospital Admission (non-emergent condition) X- 1 15 New Admissions / Biomedical engineer / Ordering NPWT Apligraf, etc. , _6  - 0 Emergency Hospital Admission (emergent condition) PROCESS - Special Needs _7  - 0 Pediatric / Minor Patient  Management _0  - 0 Isolation Patient Management _1  - 0 Hearing / Language / Visual special needs _2  - 0 Assessment of Community assistance (transportation, D/C planning, etc.) _3  - 0 Additional assistance / Altered mentation _4  - 0 Support Surface(s) Assessment (bed, cushion, seat, etc.) INTERVENTIONS - Miscellaneous _5  - 0 External ear exam _6  - 0 Patient Transfer (multiple staff / Civil Service fast streamer / Similar devices) _7  - 0 Simple Staple / Suture removal (25 or less) _8  - 0 Complex Staple / Suture removal (26 or more) _9  - 0 Hypo/Hyperglycemic Management (do not check if billed separately) X- 1 15 Ankle / Brachial Index (ABI) - do not check if billed separately Has the patient been seen at the hospital within the last three years: Yes Total Score: 110 Level Of Care: New/Established - Level 3 Electronic Signature(s) Signed: 09/03/2021 5:09:46 PM By: Deon Pilling RN, BSN Entered By: Deon Pilling on 09/03/2021 13:35:38 -------------------------------------------------------------------------------- Encounter Discharge Information Details Patient Name: Date of Service: Jennifer Reader L. 09/03/2021 1:15 PM Medical Record Number: [id number] Patient Account Number: [id number] Date of Birth/Sex: Treating RN: [birth year] (60 y.o. [name] Primary Care Sybil Shrader: Lyman Bishop MS, Almyra Free Other Clinician: Referring Faria Casella: Treating Chriss Mannan/Extender: Daiva Eves in Treatment: 0 Encounter Discharge Information Items Post Procedure Vitals Discharge Condition: Stable Temperature (F):  98.1 Ambulatory Status: Wheelchair Pulse (bpm): 86 Discharge Destination: Home Respiratory Rate (breaths/min): 20 Transportation: Private Auto Blood Pressure (mmHg): 144/82 Accompanied By: self Schedule Follow-up Appointment: Yes Clinical Summary of Care: Notes Discussed someone to call to schedules tests. Patient in agreement. Electronic Signature(s) Signed: 09/03/2021 5:09:46 PM By: Deon Pilling RN, BSN Entered By: Deon Pilling on 09/03/2021 13:36:48 -------------------------------------------------------------------------------- Lower Extremity Assessment Details Patient Name: Date of Service: Jennifer [name], Jennifer [name] 09/03/2021 1:15 PM Medical Record Number: [id number] Patient Account Number: [id number] Date of Birth/Sex: Treating RN: 22-Nov-1961 (60 y.o. [name] Primary Care Amila Callies: Lyman Bishop MS, Almyra Free Other Clinician: Referring Lauree Yurick: Treating Ayven Pheasant/Extender: Daiva Eves in Treatment: 0 Edema Assessment Assessed: [Left: No] [Right: Yes] Edema: [Left: Ye] [Right: s] Calf Left: Right: Point of Measurement: From Medial Instep 51 cm Ankle Left: Right: Point of Measurement: From Medial Instep 31.8 cm Knee To Floor Left: Right: From Medial Instep 41 cm Vascular Assessment Pulses: Dorsalis Pedis Palpable: [Right:Yes] Blood Pressure: Brachial: [Right:144] Ankle: [Right:Dorsalis Pedis: 130 0.90] Electronic Signature(s) Signed: 09/03/2021 5:09:46 PM By: Deon Pilling RN, BSN Entered By: Deon Pilling on 09/03/2021 13:07:14 -------------------------------------------------------------------------------- Multi Wound Chart Details Patient Name: Date of Service: Jennifer Reader L. 09/03/2021 1:15 PM Medical Record Number: [id number] Patient Account Number: [id number] Date of Birth/Sex: Treating RN: November 05, 1961 (60 y.o. [name], Meta.Reding Primary Care Khanh Tanori: Lyman Bishop MS, Almyra Free Other Clinician: Referring Amika Tassin: Treating  Eyla Tallon/Extender: Daiva Eves in Treatment: 0 Vital Signs Height(in): 67 Capillary Blood Glucose(mg/dl): 158 Weight(lbs): 240 Pulse(bpm): 24 Body Mass Index(BMI): 31 Blood Pressure(mmHg): 144/82 Temperature(F): 98.1 Respiratory Rate(breaths/min): 20 Photos: [N/A:N/A] Right T Fourth oe N/A N/A Wound Location: Gradually Appeared N/A N/A Wounding Event: Diabetic Wound/Ulcer of the Lower N/A N/A Primary Etiology: Extremity Cataracts, Glaucoma, Chronic sinus N/A N/A Comorbid History: problems/congestion, Anemia, Asthma, Chronic Obstructive Pulmonary Disease (COPD), Congestive Heart Failure, Hypertension, Peripheral Arterial Disease, Peripheral Venous Disease, Type II Diabetes, Rheumatoid Arthritis, Osteoarthritis, Osteomyelitis, Neuropathy, Seizure Disorder 06/21/2021 N/A N/A Date Acquired: 0 N/A N/A Weeks of Treatment: Open N/A N/A Wound Status: 0.7x0.7x0.2 N/A N/A Measurements L x W x D (cm) 0.385 N/A N/A A (cm) :  rea 0.077 N/A N/A Volume (cm) : 0.00% N/A N/A % Reduction in A rea: 0.00% N/A N/A % Reduction in Volume: Grade 2 N/A N/A Classification: Medium N/A N/A Exudate A mount: Serosanguineous N/A N/A Exudate Type: red, brown N/A N/A Exudate Color: Distinct, outline attached N/A N/A Wound Margin: Medium (34-66%) N/A N/A Granulation A mount: Red, Friable N/A N/A Granulation Quality: Medium (34-66%) N/A N/A Necrotic A mount: Fat Layer (Subcutaneous Tissue): Yes N/A N/A Exposed Structures: Bone: Yes Fascia: No Tendon: No Muscle: No Joint: No Chemical/Enzymatic/Mechanical N/A N/A Debridement: N/A N/A N/A Instrument: None N/A N/A Bleeding: Debridement Treatment Response: Procedure was tolerated well N/A N/A Post Debridement Measurements L x 0.7x0.7x0.2 N/A N/A W x D (cm) 0.077 N/A N/A Post Debridement Volume: (cm) Debridement N/A N/A Procedures Performed: Treatment Notes Wound #22 (Toe Fourth) Wound  Laterality: Right Cleanser Soap and Water Discharge Instruction: May shower and wash wound with dial antibacterial soap and water prior to dressing change. Wound Cleanser Discharge Instruction: Cleanse the wound with wound cleanser prior to applying a clean dressing using gauze sponges, not tissue or cotton balls. Peri-Wound Care Topical Gentamicin Discharge Instruction: As directed by physician Primary Dressing Hydrofera Blue Ready Foam, 2.5 x2.5 in Discharge Instruction: ***APPLY GENTAMICIN OINTMENT UNDER THE HYDROFERA BLUE.***Apply to wound bed as instructed Secondary Dressing Woven Gauze Sponges 2x2 in Discharge Instruction: Apply over primary dressing as directed. Secured With [address]M [place] [facility] T ape, 2x2 (in/yd) Discharge Instruction: Secure dressing with tape as directed. Compression Wrap Compression Stockings Add-Ons Electronic Signature(s) Signed: 09/03/2021 1:52:08 PM By: Kalman Shan DO Signed: 09/03/2021 5:09:46 PM By: Deon Pilling RN, BSN Entered By: Kalman Shan on 09/03/2021 13:41:20 -------------------------------------------------------------------------------- Multi-Disciplinary Care Plan Details Patient Name: Date of Service: Jennifer Reader L. 09/03/2021 1:15 PM Medical Record Number: [id number] Patient Account Number: [id number] Date of Birth/Sex: Treating RN: Jul 21, 1961 (60 y.o. [name], Tammi Klippel Primary Care Jeilani Grupe: Lyman Bishop MS, Almyra Free Other Clinician: Referring Nakiyah Beverley: Treating Alexarae Oliva/Extender: Daiva Eves in Treatment: 0 Active Inactive Orientation to the Wound Care Program Nursing Diagnoses: Knowledge deficit related to the wound healing center program Goals: Patient/caregiver will verbalize understanding of the [facility] Program Date Initiated: 09/03/2021 Target Resolution Date: 09/27/2021 Goal Status: Active Interventions: Provide education on orientation to the wound  center Notes: Pain, Acute or Chronic Nursing Diagnoses: Pain, acute or chronic: actual or potential Potential alteration in comfort, pain Goals: Patient will verbalize adequate pain control and receive pain control interventions during procedures as needed Date Initiated: 09/03/2021 Target Resolution Date: 09/18/2021 Goal Status: Active Patient/caregiver will verbalize comfort level met Date Initiated: 09/03/2021 Target Resolution Date: 09/19/2021 Goal Status: Active Interventions: Encourage patient to take pain medications as prescribed Provide education on pain management Reposition patient for comfort Treatment Activities: Administer pain control measures as ordered : 09/03/2021 Notes: Soft Tissue Infection Nursing Diagnoses: Potential for infection: soft tissue Goals: Patient will remain free of wound infection Date Initiated: 09/03/2021 Target Resolution Date: 09/19/2021 Goal Status: Active Signs and symptoms of infection will be recognized early to allow for prompt treatment Date Initiated: 09/03/2021 Target Resolution Date: 09/26/2021 Goal Status: Active Interventions: Assess signs and symptoms of infection every visit Provide education on infection Screen for HBO Treatment Activities: Systemic antibiotics : 09/03/2021 T ordered outside of clinic : 09/03/2021 est Notes: Wound/Skin Impairment Nursing Diagnoses: Knowledge deficit related to ulceration/compromised skin integrity Goals: Patient/caregiver will verbalize understanding of skin care regimen Date Initiated: 09/03/2021 Target Resolution Date: 09/26/2021 Goal Status: Active Interventions:  Assess patient/caregiver ability to perform ulcer/skin care regimen upon admission and as needed Assess ulceration(s) every visit Provide education on ulcer and skin care Treatment Activities: Skin care regimen initiated : 09/03/2021 Topical wound management initiated : 09/03/2021 Notes: Electronic Signature(s) Signed:  09/03/2021 5:09:46 PM By: Deon Pilling RN, BSN Entered By: Deon Pilling on 09/03/2021 13:08:54 -------------------------------------------------------------------------------- Pain Assessment Details Patient Name: Date of Service: Jennifer Reader L. 09/03/2021 1:15 PM Medical Record Number: [id number] Patient Account Number: [id number] Date of Birth/Sex: Treating RN: May 07, 1961 (60 y.o. [name] Primary Care Amor Hyle: Lyman Bishop MS, Almyra Free Other Clinician: Referring Batya Citron: Treating Hortense Cantrall/Extender: Daiva Eves in Treatment: 0 Active Problems Location of Pain Severity and Description of Pain Patient Has Paino Yes Site Locations Pain Location: Generalized Pain, Pain in Ulcers Rate the pain. Current Pain Level: 8 Worst Pain Level: 10 Least Pain Level: 0 Tolerable Pain Level: 8 Pain Management and Medication Current Pain Management: Medication: No Cold Application: No Rest: No Massage: No Activity: No T.E.N.S.: No Heat Application: No Leg drop or elevation: No Is the Current Pain Management Adequate: Adequate How does your wound impact your activities of daily livingo Sleep: No Bathing: No Appetite: No Relationship With Others: No Bladder Continence: No Emotions: No Bowel Continence: No Work: No Toileting: No Drive: No Dressing: No Hobbies: No Engineer, maintenance) Signed: 09/03/2021 5:09:46 PM By: Deon Pilling RN, BSN Entered By: Deon Pilling on 09/03/2021 13:03:22 -------------------------------------------------------------------------------- Patient/Caregiver Education Details Patient Name: Date of Service: Jennifer [name] 9/13/2022andnbsp1:15 PM Medical Record Number: [id number] Patient Account Number: [id number] Date of Birth/Gender: Treating RN: December 05, 1961 (60 y.o. [name] Primary Care Physician: Lyman Bishop MS, Almyra Free Other Clinician: Referring Physician: Treating Physician/Extender: Daiva Eves in Treatment: 0 Education Assessment Education Provided To: Patient Education Topics Provided Welcome T [facility]: o Handouts: Welcome T [facility] o Methods: Explain/Verbal Responses: Reinforcements needed Electronic Signature(s) Signed: 09/03/2021 5:09:46 PM By: Deon Pilling RN, BSN Entered By: Deon Pilling on 09/03/2021 13:09:21 -------------------------------------------------------------------------------- Wound Assessment Details Patient Name: Date of Service: Jennifer Reader L. 09/03/2021 1:15 PM Medical Record Number: [id number] Patient Account Number: [id number] Date of Birth/Sex: Treating RN: August 19, 1961 (60 y.o. [name], Meta.Reding Primary Care Shaida Route: Lyman Bishop MS, Almyra Free Other Clinician: Referring Clarance Bollard: Treating Tayana Shankle/Extender: Daiva Eves in Treatment: 0 Wound Status Wound Number: 22 Primary Diabetic Wound/Ulcer of the Lower Extremity Etiology: Wound Location: Right T Fourth oe Wound Open Wounding Event: Gradually Appeared Status: Date Acquired: 06/21/2021 Comorbid Cataracts, Glaucoma, Chronic sinus problems/congestion, Anemia, Weeks Of Treatment: 0 History: Asthma, Chronic Obstructive Pulmonary Disease (COPD), Clustered Wound: No Congestive Heart Failure, Hypertension, Peripheral Arterial Disease, Peripheral Venous Disease, Type II Diabetes, Rheumatoid Arthritis, Osteoarthritis, Osteomyelitis, Neuropathy, Seizure Disorder Photos Wound Measurements Length: (cm) 0.7 Width: (cm) 0.7 Depth: (cm) 0.2 Area: (cm) 0.385 Volume: (cm) 0.077 % Reduction in Area: 0% % Reduction in Volume: 0% Tunneling: No Undermining: No Wound Description Classification: Grade 2 Wound Margin: Distinct, outline attached Exudate Amount: Medium Exudate Type: Serosanguineous Exudate Color: red, brown Foul Odor After Cleansing: No Slough/Fibrino Yes Wound Bed Granulation Amount: Medium (34-66%) Exposed  Structure Granulation Quality: Red, Friable Fascia Exposed: No Necrotic Amount: Medium (34-66%) Fat Layer (Subcutaneous Tissue) Exposed: Yes Necrotic Quality: Adherent Slough Tendon Exposed: No Muscle Exposed: No Joint Exposed: No Bone Exposed: Yes Treatment Notes Wound #22 (Toe Fourth) Wound Laterality: Right Cleanser Soap and Water Discharge Instruction: May shower and wash wound with dial antibacterial  soap and water prior to dressing change. Wound Cleanser Discharge Instruction: Cleanse the wound with wound cleanser prior to applying a clean dressing using gauze sponges, not tissue or cotton balls. Peri-Wound Care Topical Gentamicin Discharge Instruction: As directed by physician Primary Dressing Hydrofera Blue Ready Foam, 2.5 x2.5 in Discharge Instruction: ***APPLY GENTAMICIN OINTMENT UNDER THE HYDROFERA BLUE.***Apply to wound bed as instructed Secondary Dressing Woven Gauze Sponges 2x2 in Discharge Instruction: Apply over primary dressing as directed. Secured With 44M [place] Surgical T ape, 2x2 (in/yd) Discharge Instruction: Secure dressing with tape as directed. Compression Wrap Compression Stockings Add-Ons Electronic Signature(s) Signed: 09/03/2021 5:09:46 PM By: Deon Pilling RN, BSN Entered By: Deon Pilling on 09/03/2021 13:00:41 -------------------------------------------------------------------------------- Vitals Details Patient Name: Date of Service: Jennifer Reader L. 09/03/2021 1:15 PM Medical Record Number: [id number] Patient Account Number: [id number] Date of Birth/Sex: Treating RN: Aug 19, 1961 (60 y.o. [name], Tammi Klippel Primary Care Prerna Harold: Lyman Bishop MS, Almyra Free Other Clinician: Referring Oliver Heitzenrater: Treating Moksha Dorgan/Extender: Daiva Eves in Treatment: 0 Vital Signs Time Taken: 12:50 Temperature (F): 98.1 Height (in): 67 Pulse (bpm): 86 Source: Stated Respiratory Rate (breaths/min): 20 Weight (lbs):  240 Blood Pressure (mmHg): 144/82 Source: Stated Capillary Blood Glucose (mg/dl): 158 Body Mass Index (BMI): 37.6 Reference Range: 80 - 120 mg / dl Electronic Signature(s) Signed: 09/03/2021 5:09:46 PM By: Deon Pilling RN, BSN Entered By: Deon Pilling on 09/03/2021 13:06:04

## 2021-09-03 NOTE — Progress Notes (Signed)
Jennifer, [name] ([id number]) Visit Report for 09/03/2021 Abuse/Suicide Risk Screen Details Patient Name: Date of Service: Jennifer [name], Jennifer [name] 09/03/2021 1:15 PM Medical Record Number: [id number] Patient Account Number: [id number] Date of Birth/Sex: Treating RN: 02/17/1961 (60 y.o. Jennifer [name] Primary Care Kaidence Sant: Lyman Bishop MS, Almyra Free Other Clinician: Referring Huriel Matt: Treating Antonella Upson/Extender: Daiva Eves in Treatment: 0 Abuse/Suicide Risk Screen Items Answer ABUSE RISK SCREEN: Has anyone close to you tried to hurt or harm you recentlyo No Do you feel uncomfortable with anyone in your familyo No Has anyone forced you do things that you didnt want to doo No Electronic Signature(s) Signed: 09/03/2021 5:09:46 PM By: Deon Pilling RN, BSN Entered By: Deon Pilling on 09/03/2021 12:52:54 -------------------------------------------------------------------------------- Activities of Daily Living Details Patient Name: Date of Service: Jennifer, [name] 09/03/2021 1:15 PM Medical Record Number: [id number] Patient Account Number: [id number] Date of Birth/Sex: Treating RN: [birth year] (60 y.o. Jennifer [name] Primary Care Lilybelle Mayeda: Lyman Bishop MS, Almyra Free Other Clinician: Referring Jamair Cato: Treating Bertina Guthridge/Extender: Daiva Eves in Treatment: 0 Activities of Daily Living Items Answer Activities of Daily Living (Please select one for each item) Drive Automobile Not Able T Medications ake Completely Able Use T elephone Completely Able Care for Appearance Completely Able Use T oilet Completely Able Bath / Shower Completely Able Dress Self Completely Able Feed Self Completely Able Walk Need Assistance Get In / Out Bed Completely Able Housework Completely Able Prepare Meals Completely [place] for Self Completely Able Electronic Signature(s) Signed: 09/03/2021 5:09:46 PM By: Deon Pilling RN, BSN Entered  By: Deon Pilling on 09/03/2021 12:57:46 -------------------------------------------------------------------------------- Education Screening Details Patient Name: Date of Service: Jennifer Reader L. 09/03/2021 1:15 PM Medical Record Number: [id number] Patient Account Number: [id number] Date of Birth/Sex: Treating RN: 04-03-1961 (60 y.o. Jennifer [name] Primary Care Zakhari Fogel: Lyman Bishop MS, Almyra Free Other Clinician: Referring Jamesha Ellsworth: Treating Dream Harman/Extender: Daiva Eves in Treatment: 0 Primary Learner Assessed: Patient Learning Preferences/Education Level/Primary Language Learning Preference: Explanation, Demonstration, Printed Material Preferred Language: English Cognitive Barrier Language Barrier: No Translator Needed: No Memory Deficit: No Emotional Barrier: No Cultural/Religious Beliefs Affecting Medical Care: No Physical Barrier Impaired Vision: Yes Glasses Impaired Hearing: No Decreased Hand dexterity: No Knowledge/Comprehension Knowledge Level: Medium Comprehension Level: Medium Ability to understand written instructions: Medium Ability to understand verbal instructions: Medium Motivation Anxiety Level: Calm Cooperation: Cooperative Education Importance: Acknowledges Need Interest in Health Problems: Asks Questions Perception: Coherent Willingness to Engage in Self-Management High Activities: Readiness to Engage in Self-Management High Activities: Electronic Signature(s) Signed: 09/03/2021 5:09:46 PM By: Deon Pilling RN, BSN Entered By: Deon Pilling on 09/03/2021 12:58:15 -------------------------------------------------------------------------------- Fall Risk Assessment Details Patient Name: Date of Service: FA RRA [name] 09/03/2021 1:15 PM Medical Record Number: [id number] Patient Account Number: [id number] Date of Birth/Sex: Treating RN: 16-May-1961 (60 y.o. [name], Meta.Reding Primary Care Neah Sporrer: Lyman Bishop MS, Almyra Free Other  Clinician: Referring Caylin Nass: Treating Aaralyn Kil/Extender: Daiva Eves in Treatment: 0 Fall Risk Assessment Items Have you had 2 or more falls in the last 12 monthso 0 Yes Have you had any fall that resulted in injury in the last 12 monthso 0 No FALLS RISK SCREEN History of falling - immediate or within 3 months 25 Yes Secondary diagnosis (Do you have 2 or more medical diagnoseso) 0 No Ambulatory aid None/bed rest/wheelchair/nurse 0 Yes Crutches/cane/walker 0 No Furniture 0 No Intravenous therapy Access/Saline/Heparin Lock 0 No Gait/Transferring Normal/ bed rest/ wheelchair 0 Yes  Weak (short steps with or without shuffle, stooped but able to lift head while walking, may seek 0 No support from furniture) Impaired (short steps with shuffle, may have difficulty arising from chair, head down, impaired 0 No balance) Mental Status Oriented to own ability 0 Yes Electronic Signature(s) Signed: 09/03/2021 5:09:46 PM By: Deon Pilling RN, BSN Entered By: Deon Pilling on 09/03/2021 12:59:22 -------------------------------------------------------------------------------- Foot Assessment Details Patient Name: Date of Service: Jennifer Reader L. 09/03/2021 1:15 PM Medical Record Number: [id number] Patient Account Number: [id number] Date of Birth/Sex: Treating RN: Aug 26, 1961 (60 y.o. Jennifer [name] Primary Care Delania Ferg: Lyman Bishop MS, Almyra Free Other Clinician: Referring Annisa Mazzarella: Treating Azaliah Carrero/Extender: Daiva Eves in Treatment: 0 Foot Assessment Items Site Locations + = Sensation present, - = Sensation absent, C = Callus, U = Ulcer R = Redness, W = Warmth, M = Maceration, PU = Pre-ulcerative lesion F = Fissure, S = Swelling, D = Dryness Assessment Right: Left: Other Deformity: No No Prior Foot Ulcer: No No Prior Amputation: Yes Yes Charcot Joint: No No Ambulatory Status: Non-ambulatory Assistance Device: Wheelchair Gait:  Administrator, arts) Signed: 09/03/2021 5:09:46 PM By: Deon Pilling RN, BSN Entered By: Deon Pilling on 09/03/2021 13:02:52 -------------------------------------------------------------------------------- Nutrition Risk Screening Details Patient Name: Date of Service: Jennifer, [name] 09/03/2021 1:15 PM Medical Record Number: [id number] Patient Account Number: [id number] Date of Birth/Sex: Treating RN: 1961/07/22 (60 y.o. [name], Meta.Reding Primary Care Yavier Snider: Lyman Bishop MS, Almyra Free Other Clinician: Referring Sahid Borba: Treating Lavoy Bernards/Extender: Daiva Eves in Treatment: 0 Height (in): 67 Weight (lbs): 240 Body Mass Index (BMI): 37.6 Nutrition Risk Screening Items Score Screening NUTRITION RISK SCREEN: I have an illness or condition that made me change the kind and/or amount of food I eat 2 Yes I eat fewer than two meals per day 0 No I eat few fruits and vegetables, or milk products 0 No I have three or more drinks of beer, liquor or wine almost every day 0 No I have tooth or mouth problems that make it hard for me to eat 0 No I don't always have enough money to buy the food I need 0 No I eat alone most of the time 0 No I take three or more different prescribed or over-the-counter drugs a day 1 Yes Without wanting to, I have lost or gained 10 pounds in the last six months 0 No I am not always physically able to shop, cook and/or feed myself 0 No Nutrition Protocols Good Risk Protocol Provide education on elevated blood Moderate Risk Protocol 0 sugars and impact on wound healing, as applicable High Risk Proctocol Risk Level: Moderate Risk Score: 3 Electronic Signature(s) Signed: 09/03/2021 5:09:46 PM By: Deon Pilling RN, BSN Entered By: Deon Pilling on 09/03/2021 12:59:33

## 2021-09-04 ENCOUNTER — Telehealth: Payer: Self-pay

## 2021-09-04 ENCOUNTER — Other Ambulatory Visit (HOSPITAL_COMMUNITY): Payer: Self-pay | Admitting: Internal Medicine

## 2021-09-04 ENCOUNTER — Other Ambulatory Visit: Payer: Self-pay | Admitting: Internal Medicine

## 2021-09-04 ENCOUNTER — Other Ambulatory Visit: Payer: Self-pay

## 2021-09-04 DIAGNOSIS — E11621 Type 2 diabetes mellitus with foot ulcer: Secondary | ICD-10-CM

## 2021-09-04 DIAGNOSIS — I739 Peripheral vascular disease, unspecified: Secondary | ICD-10-CM

## 2021-09-04 DIAGNOSIS — L97519 Non-pressure chronic ulcer of other part of right foot with unspecified severity: Secondary | ICD-10-CM

## 2021-09-04 MED ORDER — OXYCODONE-ACETAMINOPHEN 5-325 MG PO TABS: 1.0000 | ORAL_TABLET | ORAL | 0 refills | Status: DC

## 2021-09-04 NOTE — Telephone Encounter (Signed)
Pt is requesting her oxyCODONE-acetaminophen (PERCOCET/ROXICET) 5-325 MG tabletsent to  [id number] [place] [address] Phone:  [phone] Fax:  [phone]

## 2021-09-04 NOTE — Telephone Encounter (Signed)
Returned call to patient. Wanted PCP to be aware the 4th digit on right foot is black. Wound doctor was able to put Q-Tip into wound and touch bone. They think it is osteomyelitis, and are discussing amputating toe. States she has an arterial duplex study tomorrow, and MRI on 9/27. Also, has f/u with Diabetic Educator and PCP on 9/29.   Also, states Botox injection into bladder did not work. They have started her on Gemtesa.   Patient states she has been going through a lot. She would appreciate call from PCP if she has time.

## 2021-09-04 NOTE — Telephone Encounter (Signed)
Pt is requesting a call back she is wanting to speak to lauren about her health she stated that she is about to have some amputations (  her toes )

## 2021-09-04 NOTE — Telephone Encounter (Signed)
Last rx written  08/05/21. Last OV  07/11/21. Next OV 09/19/21. UDS 06/17/19.

## 2021-09-05 ENCOUNTER — Other Ambulatory Visit: Payer: Self-pay

## 2021-09-05 ENCOUNTER — Ambulatory Visit (HOSPITAL_COMMUNITY)
Admission: RE | Admit: 2021-09-05 | Discharge: 2021-09-05 | Disposition: A | Discharge status: Home / Self Care | Payer: Medicare Other | Source: Ambulatory Visit | Attending: Podiatry | Admitting: Podiatry

## 2021-09-05 ENCOUNTER — Ambulatory Visit (INDEPENDENT_AMBULATORY_CARE_PROVIDER_SITE_OTHER)
Admission: RE | Admit: 2021-09-05 | Discharge: 2021-09-05 | Disposition: A | Discharge status: Home / Self Care | Payer: Medicare Other | Source: Ambulatory Visit | Attending: Podiatry | Admitting: Podiatry

## 2021-09-05 ENCOUNTER — Other Ambulatory Visit: Payer: Self-pay | Admitting: Internal Medicine

## 2021-09-05 DIAGNOSIS — I739 Peripheral vascular disease, unspecified: Secondary | ICD-10-CM

## 2021-09-06 ENCOUNTER — Other Ambulatory Visit: Payer: Self-pay

## 2021-09-09 ENCOUNTER — Other Ambulatory Visit: Payer: Self-pay

## 2021-09-09 MED ORDER — NYSTATIN 100000 UNIT/GM EX POWD: 1.0000 "application " | Freq: Three times a day (TID) | CUTANEOUS | 2 refills | Status: DC

## 2021-09-10 ENCOUNTER — Other Ambulatory Visit: Payer: Self-pay

## 2021-09-10 ENCOUNTER — Encounter (HOSPITAL_BASED_OUTPATIENT_CLINIC_OR_DEPARTMENT_OTHER): Payer: Medicare Other | Admitting: Internal Medicine

## 2021-09-10 ENCOUNTER — Other Ambulatory Visit: Payer: Self-pay | Admitting: Podiatry

## 2021-09-10 DIAGNOSIS — E11621 Type 2 diabetes mellitus with foot ulcer: Secondary | ICD-10-CM

## 2021-09-10 DIAGNOSIS — I739 Peripheral vascular disease, unspecified: Secondary | ICD-10-CM

## 2021-09-10 DIAGNOSIS — S91301D Unspecified open wound, right foot, subsequent encounter: Secondary | ICD-10-CM | POA: Diagnosis not present

## 2021-09-10 DIAGNOSIS — I5032 Chronic diastolic (congestive) heart failure: Secondary | ICD-10-CM | POA: Diagnosis not present

## 2021-09-10 MED ORDER — GENTAMICIN SULFATE 0.1 % EX CREA: 1.0000 "application " | TOPICAL_CREAM | Freq: Two times a day (BID) | CUTANEOUS | 1 refills | Status: DC

## 2021-09-10 NOTE — Progress Notes (Signed)
RHETA, HEMMELGARN ([id number]) Visit Report for 09/10/2021 Chief Complaint Document Details Patient Name: Date of Service: Jennifer, [name] 09/10/2021 2:30 PM Medical Record Number: [id number] Patient Account Number: [id number] Date of Birth/Sex: Treating RN: [birth year] (60 y.o. Jennifer [name], Jennifer [name] Primary Care Provider: Dorian [name] Other Clinician: Referring Provider: Treating Provider/Extender: Jennifer [name] in Treatment: 1 Information Obtained from: Patient Chief Complaint 09/03/2021: Patient presents for fourth right toe wound Electronic Signature(s) Signed: 09/10/2021 3:04:40 PM By: Jennifer Shan DO Entered By: Jennifer [name] on 09/10/2021 14:53:49 -------------------------------------------------------------------------------- HPI Details Patient Name: Date of Service: Jennifer [name]. 09/10/2021 2:30 PM Medical Record Number: [id number] Patient Account Number: [id number] Date of Birth/Sex: Treating RN: 1961-11-23 (60 y.o. Jennifer [name] Primary Care Provider: Dorian [name] Other Clinician: Referring Provider: Treating Provider/Extender: Jennifer [name] in Treatment: 1 History of Present Illness Location: right fifth toe Quality: admits to sharp pain with digital inspection and periwound palpation Duration: healed September 2017, reopened sometime prior to Thanksgiving but unaware until Thanksgiving Timing: pain is primarily with palpation and digital inspection Context: unclear causative factor HPI Description: Admission 9/13 Jennifer [name] is a 60 year old female with a past medical history of insulin-dependent type 2 diabetes, left lower extremity BKA, right partial fifth ray amputation that presents today for a wound on her fourth right toe. On 07/08/2021 patient was evaluated by Jennifer [name] for the toe wound. She was started on doxycycline and gentamicin cream at that time. She has been following closely for  podiatry for this issue. She had an x-ray of the foot on 8/8 that did not show erosion or cortical destruction concerning for osteomyelitis. Patient has been using gentamicin cream with daily dressing changes. She currently denies signs of infection. She was referred to [place] by Jennifer [name] office. 9/20; patient presents for 1 week follow-up. She has been using gentamicin and Hydrofera Blue to the wound bed. She obtained ABIs with TBI's. She has her MRI scheduled for 09/17/2021. She has no issues or complaints today. She denies signs of infection. Electronic Signature(s) Signed: 09/10/2021 3:04:40 PM By: Jennifer Shan DO Entered By: Jennifer [name] on 09/10/2021 14:56:10 -------------------------------------------------------------------------------- Physical Exam Details Patient Name: Date of Service: Jennifer [name] 09/10/2021 2:30 PM Medical Record Number: [id number] Patient Account Number: [id number] Date of Birth/Sex: Treating RN: October 01, 1961 (60 y.o. Jennifer [name] Primary Care Provider: Dorian [name] Other Clinician: Referring Provider: Treating Provider/Extender: Jennifer [name] in Treatment: 1 Constitutional respirations regular, non-labored and within target range for patient.. Cardiovascular 2+ dorsalis pedis/posterior tibialis pulses. Psychiatric pleasant and cooperative. Notes Right foot: T the dorsal aspect of the fourth toe there is an open wound with pale tissue present. Does not probe to bone today. No obvious signs of o surrounding soft tissue infection. Electronic Signature(s) Signed: 09/10/2021 3:04:40 PM By: Jennifer Shan DO Entered By: Jennifer [name] on 09/10/2021 14:58:07 -------------------------------------------------------------------------------- Physician Orders Details Patient Name: Date of Service: Jennifer [name] 09/10/2021 2:30 PM Medical Record Number: [id number] Patient Account Number: [id number] Date of  Birth/Sex: Treating RN: 1961/10/06 (60 y.o. Jennifer [name] Primary Care Provider: Dorian [name] Other Clinician: Referring Provider: Treating Provider/Extender: Jennifer [name] in Treatment: 1 Verbal / Phone Orders: No Diagnosis Coding ICD-10 Coding Code Description E11.621 Type 2 diabetes mellitus with foot ulcer S91.301D Unspecified open wound, right foot, subsequent encounter I73.9 Peripheral vascular disease, unspecified K44.01 Chronic diastolic (congestive)  heart failure Z89.512 Acquired absence of left leg below knee Z89.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) E11.59 Type 2 diabetes mellitus with other circulatory complications D98.338 Long term (current) use of opiate analgesic Follow-up Appointments ppointment in 2 weeks. - Dr. Heber [place] Return A Proceed with MRI; will call you with results. Bathing/ Shower/ Hygiene May shower and wash wound with soap and water. - with dressing changes. Edema Control - Lymphedema / SCD / Other Elevate legs to the level of the heart or above for 30 minutes daily and/or when sitting, a frequency of: - 3-4 times throughout the day. Avoid standing for long periods of time. Moisturize legs daily. - both legs every night before bed. Compression stocking or Garment 20-30 mm/Hg pressure to: Off-Loading Open toe surgical [name] to: - right foot. ensure no pressure or rubbing to right foot 4th wound. Wound Treatment Wound #22 - T Fourth oe Wound Laterality: Right Cleanser: Soap and Water 1 x Per Day/30 Days Discharge Instructions: May shower and wash wound with dial antibacterial soap and water prior to dressing change. Cleanser: Wound Cleanser (Generic) 1 x Per Day/30 Days Discharge Instructions: Cleanse the wound with wound cleanser prior to applying a clean dressing using gauze sponges, not tissue or cotton balls. Topical: Gentamicin 1 x Per Day/30 Days Discharge Instructions: As directed by physician Prim  Dressing: Hydrofera Blue Ready Foam, 2.5 x2.5 in (DME) (Generic) 1 x Per Day/30 Days ary Discharge Instructions: ***APPLY GENTAMICIN OINTMENT UNDER THE HYDROFERA BLUE.***Apply to wound bed as instructed Secondary Dressing: Woven Gauze Sponges 2x2 in (Generic) 1 x Per Day/30 Days Discharge Instructions: Apply over primary dressing as directed. Secured With: 70M Medipore H Soft Cloth Surgical Tape, 2x2 (in/yd) (Generic) 1 x Per Day/30 Days Discharge Instructions: Secure dressing with tape as directed. Electronic Signature(s) Signed: 09/10/2021 3:04:40 PM By: Jennifer Shan DO Entered By: Jennifer [name] on 09/10/2021 14:58:27 -------------------------------------------------------------------------------- Problem List Details Patient Name: Date of Service: Jennifer [name] 09/10/2021 2:30 PM Medical Record Number: [id number] Patient Account Number: [id number] Date of Birth/Sex: Treating RN: 22-Sep-1961 (60 y.o. Jennifer [name], Jennifer [name] Primary Care Provider: Dorian [name] Other Clinician: Referring Provider: Treating Provider/Extender: Jennifer [name] in Treatment: 1 Active Problems ICD-10 Encounter Code Description Active Date MDM Diagnosis E11.621 Type 2 diabetes mellitus with foot ulcer 09/03/2021 No Yes S91.301D Unspecified open wound, right foot, subsequent encounter 09/10/2021 No Yes I73.9 Peripheral vascular disease, unspecified 09/03/2021 No Yes H41.93 Chronic diastolic (congestive) heart failure 09/03/2021 No Yes Z89.512 Acquired absence of left leg below knee 09/03/2021 No Yes Z89.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) 09/03/2021 No Yes E11.59 Type 2 diabetes mellitus with other circulatory complications 7/90/2409 No Yes Z79.891 Long term (current) use of opiate analgesic 09/03/2021 No Yes Inactive Problems ICD-10 Code Description Active Date Inactive Date S91.301A Unspecified open wound, right foot, initial encounter 09/03/2021 09/03/2021 Resolved  Problems Electronic Signature(s) Signed: 09/10/2021 3:04:40 PM By: Jennifer Shan DO Entered By: Jennifer [name] on 09/10/2021 14:53:31 -------------------------------------------------------------------------------- Progress Note Details Patient Name: Date of Service: Jennifer [name]. 09/10/2021 2:30 PM Medical Record Number: [id number] Patient Account Number: [id number] Date of Birth/Sex: Treating RN: November 16, 1961 (60 y.o. Jennifer [name] Primary Care Provider: Dorian [name] Other Clinician: Referring Provider: Treating Provider/Extender: Jennifer [name] in Treatment: 1 Subjective Chief Complaint Information obtained from Patient 09/03/2021: Patient presents for fourth right toe wound History of Present Illness (HPI) The following HPI elements were documented for the patient's wound: Location:  right fifth toe Quality: admits to sharp pain with digital inspection and periwound palpation Duration: healed September 2017, reopened sometime prior to Thanksgiving but unaware until Thanksgiving Timing: pain is primarily with palpation and digital inspection Context: unclear causative factor Admission 9/13 Jennifer [name] is a 60 year old female with a past medical history of insulin-dependent type 2 diabetes, left lower extremity BKA, right partial fifth ray amputation that presents today for a wound on her fourth right toe. On 07/08/2021 patient was evaluated by Jennifer [name] for the toe wound. She was started on doxycycline and gentamicin cream at that time. She has been following closely for podiatry for this issue. She had an x-ray of the foot on 8/8 that did not show erosion or cortical destruction concerning for osteomyelitis. Patient has been using gentamicin cream with daily dressing changes. She currently denies signs of infection. She was referred to [place] by Jennifer [name] office. 9/20; patient presents for 1 week follow-up. She has been using gentamicin and  Hydrofera Blue to the wound bed. She obtained ABIs with TBI's. She has her MRI scheduled for 09/17/2021. She has no issues or complaints today. She denies signs of infection. Patient History Information obtained from Patient. Family History Diabetes - Mother, Heart Disease - Mother, Hypertension - Mother, Seizures - Siblings, Stroke - Siblings, No family history of Cancer, Hereditary Spherocytosis, Kidney Disease, Lung Disease, Thyroid Problems, Tuberculosis. Social History Current every day smoker, Marital Status - Divorced, Alcohol Use - Never, Drug Use - No History, Caffeine Use - Moderate. 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 Denies history of Optic Neuritis Ear/Nose/Mouth/Throat Patient has history of Chronic sinus problems/congestion - Flonase Denies history of Middle ear problems Hematologic/Lymphatic Patient has history of Anemia Denies history of Hemophilia, Human Immunodeficiency Virus, Lymphedema, Sickle Cell Disease Respiratory Patient has history of Asthma, Chronic Obstructive Pulmonary Disease (COPD) Denies history of Aspiration Cardiovascular Patient has history of Congestive Heart Failure - chronic diastolic, Hypertension, Peripheral Arterial Disease, 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 Denies history of Angina, Arrhythmia, Coronary Artery Disease, Deep Vein Thrombosis, Hypotension, Myocardial Infarction, Phlebitis, Vasculitis Gastrointestinal Denies history of Cirrhosis , Colitis, Crohnoos, Hepatitis A, Hepatitis B, Hepatitis C Endocrine Patient has history of Type II Diabetes - uncontrolled w/ side effects Denies history of Type I Diabetes Genitourinary Denies history of End Stage Renal Disease Immunological Denies history of Lupus Erythematosus, Raynaudoos, Scleroderma Integumentary (Skin) Denies history of History of Burn Musculoskeletal Patient has history of Rheumatoid Arthritis, Osteoarthritis - spine,  Osteomyelitis - hx left foot Denies history of Gout Neurologic Patient has history of Neuropathy, Seizure Disorder Denies history of Dementia, Quadriplegia, Paraplegia Oncologic Denies history of Received Chemotherapy, Received Radiation Psychiatric Denies history of Anorexia/bulimia, Confinement Anxiety Hospitalization/Surgery History - left leg amputated. - COPD, PNA. 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 Medical A Surgical History Notes nd Constitutional Symptoms (General Health) chronic insomnia , opioid dependence , gait abnormality , severe obesity , s/p BKA , Eyes diabetic retinopathy Ear/Nose/Mouth/Throat oral mucosa lesions Hematologic/Lymphatic thrombocytopenia Cardiovascular hyperlipidemia , infective endocarditis , aortic atherosclerosis , Gastrointestinal GERD Integumentary (Skin) foot ulcer Neurologic childhood seizures Hx Bells Palsy Psychiatric depression Objective Constitutional respirations regular, non-labored and within target range for patient.. Vitals Time Taken: 2:20 PM, Height: 67 in, Weight: 240 lbs, BMI: 37.6, Temperature: 97.9 F, Pulse: 85 bpm, Respiratory Rate: 20 breaths/min, Blood Pressure: 147/80 mmHg, Capillary Blood Glucose: 159 mg/dl. Cardiovascular 2+ dorsalis pedis/posterior tibialis pulses. Psychiatric pleasant and cooperative. General Notes:  Right foot: T the dorsal aspect of the fourth toe there is an open wound with pale tissue present. Does not probe to bone today. No obvious o signs of surrounding soft tissue infection. Integumentary (Hair, Skin) Wound #22 status is Open. Original cause of wound was Gradually Appeared. The date acquired was: 06/21/2021. The wound has been in treatment 1 weeks. The wound is located on the Right T Fourth. The wound measures 0.6cm length x 0.7cm width x 0.2cm depth; 0.33cm^2 area and 0.066cm^3 volume. There is Fat oe Layer (Subcutaneous Tissue) exposed. There is no tunneling or undermining noted.  There is a medium amount of serosanguineous drainage noted. The wound margin is distinct with the outline attached to the wound base. There is small (1-33%) pink, pale, friable granulation within the wound bed. There is a large (67- 100%) amount of necrotic tissue within the wound bed including Adherent Slough. Assessment Active Problems ICD-10 Type 2 diabetes mellitus with foot ulcer Unspecified open wound, right foot, subsequent encounter Peripheral vascular disease, unspecified Chronic diastolic (congestive) heart failure Acquired absence of left leg below knee Acquired absence of other right toe(s) Type 2 diabetes mellitus with other circulatory complications Long term (current) use of opiate analgesic Patient's wound has shown some improvement in depth. Overall the wound is stable. No signs of infection on exam. I recommended continuing gentamicin and Hydrofera Blue with dressing changes. Patient obtained ABIs with TBI's and these were noted to be 0.9 and 0.77. Her MRI is scheduled for 09/17/2021. Follow-up in 2 weeks. Patient states that if there is a bone infection she would like to have the digit amputated. If this is the case we will refer back to Jennifer [name]. Plan Follow-up Appointments: Return Appointment in 2 weeks. - Dr. Heber Bluefield Proceed with MRI; will call you with results. Bathing/ Shower/ Hygiene: May shower and wash wound with soap and water. - with dressing changes. Edema Control - Lymphedema / SCD / Other: Elevate legs to the level of the heart or above for 30 minutes daily and/or when sitting, a frequency of: - 3-4 times throughout the day. Avoid standing for long periods of time. Moisturize legs daily. - both legs every night before bed. Compression stocking or Garment 20-30 mm/Hg pressure to: Off-Loading: Open toe surgical [name] to: - right foot. ensure no pressure or rubbing to right foot 4th wound. WOUND #22: - T Fourth Wound Laterality: Right oe Cleanser: Soap and  Water 1 x Per Day/30 Days Discharge Instructions: May shower and wash wound with dial antibacterial soap and water prior to dressing change. Cleanser: Wound Cleanser (Generic) 1 x Per Day/30 Days Discharge Instructions: Cleanse the wound with wound cleanser prior to applying a clean dressing using gauze sponges, not tissue or cotton balls. Topical: Gentamicin 1 x Per Day/30 Days Discharge Instructions: As directed by physician Prim Dressing: Hydrofera Blue Ready Foam, 2.5 x2.5 in (DME) (Generic) 1 x Per Day/30 Days ary Discharge Instructions: ***APPLY GENTAMICIN OINTMENT UNDER THE HYDROFERA BLUE.***Apply to wound bed as instructed Secondary Dressing: Woven Gauze Sponges 2x2 in (Generic) 1 x Per Day/30 Days Discharge Instructions: Apply over primary dressing as directed. Secured With: 36M Medipore H Soft Cloth Surgical T ape, 2x2 (in/yd) (Generic) 1 x Per Day/30 Days Discharge Instructions: Secure dressing with tape as directed. 1. Gentamicin and Hydrofera Blue 2. Follow-up in 2 weeks Electronic Signature(s) Signed: 09/10/2021 3:04:40 PM By: Jennifer Shan DO Entered By: Jennifer [name] on 09/10/2021 15:03:41 -------------------------------------------------------------------------------- HxROS Details Patient Name: Date of Service: Jennifer [name],  Jennifer L. 09/10/2021 2:30 PM Medical Record Number: [id number] Patient Account Number: [id number] Date of Birth/Sex: Treating RN: August 26, 1961 (60 y.o. 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 Negative for: Optic Neuritis Past Medical  History Notes: diabetic retinopathy Ear/Nose/Mouth/Throat Medical History: Positive for: Chronic sinus problems/congestion - Flonase Negative for: Middle ear problems Past Medical History Notes: oral mucosa lesions Hematologic/Lymphatic Medical History: Positive for: Anemia Negative for: Hemophilia; Human Immunodeficiency Virus; Lymphedema; Sickle Cell Disease Past Medical History Notes: thrombocytopenia Respiratory Medical History: Positive for: Asthma; Chronic Obstructive Pulmonary Disease (COPD) Negative for: Aspiration Cardiovascular Medical History: Positive for: Congestive Heart Failure - chronic diastolic; Hypertension; Peripheral Arterial Disease; 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 Negative for: Angina; Arrhythmia; Coronary Artery Disease; Deep Vein Thrombosis; Hypotension; Myocardial Infarction; Phlebitis; Vasculitis Past Medical History Notes: hyperlipidemia , infective endocarditis , aortic atherosclerosis , Gastrointestinal Medical History: Negative for: Cirrhosis ; Colitis; Crohns; Hepatitis A; Hepatitis B; Hepatitis C Past Medical History Notes: GERD Endocrine Medical History: Positive for: Type II Diabetes - uncontrolled w/ side effects Negative for: Type I Diabetes Time with diabetes: 1993 Treated with: Insulin Blood sugar tested every day: Yes Tested : daily Genitourinary Medical History: Negative for: End Stage Renal Disease Immunological Medical History: Negative for: Lupus Erythematosus; Raynauds; Scleroderma Integumentary (Skin) Medical History: Negative for: History of Burn Past Medical History Notes: foot ulcer Musculoskeletal Medical History: Positive for: Rheumatoid Arthritis; Osteoarthritis - spine; Osteomyelitis - hx left foot Negative for: Gout Neurologic Medical History: Positive for: Neuropathy; Seizure Disorder Negative for: Dementia; Quadriplegia; Paraplegia Past Medical History Notes: childhood seizures Hx Bells  Palsy Oncologic Medical History: Negative for: Received Chemotherapy; Received Radiation Psychiatric Medical History: Negative for: Anorexia/bulimia; Confinement Anxiety Past Medical History Notes: depression HBO Extended History Items Ear/Nose/Mouth/Throat: Eyes: Eyes: Chronic sinus Cataracts Glaucoma problems/congestion Immunizations Pneumococcal Vaccine: Received Pneumococcal Vaccination: Yes Received Pneumococcal Vaccination On or After 60th Birthday: No Immunization Notes: tetanus shot in 2014 Implantable Devices None Hospitalization / Surgery History Type of Hospitalization/Surgery left leg amputated COPD, 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 Family and Social History Cancer: No; Diabetes: Yes - Mother; Heart Disease: Yes - Mother; Hereditary Spherocytosis: No; Hypertension: Yes - Mother; Kidney Disease: No; Lung Disease: No; Seizures: Yes - Siblings; Stroke: Yes - Siblings; Thyroid Problems: No; Tuberculosis: No; Current every day smoker; Marital Status - Divorced; Alcohol Use: Never; Drug Use: No History; Caffeine Use: Moderate; Financial Concerns: No; Food, Clothing or Shelter Needs: No; Support System Lacking: No; Transportation Concerns: No Electronic Signature(s) Signed: 09/10/2021 3:04:40 PM By: Jennifer Shan DO Signed: 09/10/2021 5:49:24 PM By: Deon Pilling RN, BSN Entered By: Jennifer [name] on 09/10/2021 14:57:18 -------------------------------------------------------------------------------- SuperBill Details Patient Name: Date of Service: Jennifer [name] 09/10/2021 Medical Record Number: [id number] Patient Account Number: [id number] Date of Birth/Sex: Treating RN: 05/20/1961 (60 y.o. Jennifer [name] Primary Care Provider: Dorian [name] Other Clinician: Referring Provider: Treating Provider/Extender: Jennifer [name] in Treatment: 1 Diagnosis Coding ICD-10 Codes Code Description [id number] Type 2 diabetes mellitus  with foot ulcer S91.301D Unspecified open wound, right foot, subsequent encounter I73.9 Peripheral vascular disease, unspecified J09.32 Chronic diastolic (congestive) heart failure Z89.512 Acquired absence of  left leg below knee Z89.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) E11.59 Type 2 diabetes mellitus with other circulatory complications S47.158 Long term (current) use of opiate analgesic Facility Procedures CPT4 Code: [id number] Description: 99213 - WOUND CARE VISIT-LEV 3 EST PT Modifier: Quantity: 1 Physician Procedures : CPT4 Code Description Modifier [id number] 99213 - WC PHYS LEVEL 3 - EST PT ICD-10 Diagnosis Description E11.621 Type 2 diabetes mellitus with foot ulcer S91.301D Unspecified open wound, right foot, subsequent encounter P97.33 Chronic diastolic  (congestive) heart failure I73.9 Peripheral vascular disease, unspecified Quantity: 1 Electronic Signature(s) Signed: 09/10/2021 3:04:40 PM By: Jennifer Shan DO Entered By: Jennifer [name] on 09/10/2021 15:04:15

## 2021-09-10 NOTE — Progress Notes (Addendum)
BRENEE, [name] ([id number]) Visit Report for 09/10/2021 Arrival Information Details Patient Name: Date of Service: Jennifer [name], Jennifer [name] 09/10/2021 2:30 PM Medical Record Number: [id number] Patient Account Number: [id number] Date of Birth/Sex: Treating RN: 03-Nov-1961 (60 y.o. [name], Tammi Klippel Primary Care Taniya Dasher: [name] Other Clinician: Referring Brendalyn Vallely: Treating Martine Bleecker/Extender: [name] in Treatment: 1 Visit Information History Since Last Visit Added or deleted any medications: No Patient Arrived: Wheel Chair Any new allergies or adverse reactions: No Arrival Time: 14:20 Had a fall or experienced change in No Accompanied By: self activities of daily living that may affect Transfer Assistance: None risk of falls: Patient Identification Verified: Yes Signs or symptoms of abuse/neglect since No Secondary Verification Process Completed: Yes last visito Patient Requires Transmission-Based Precautions: No Hospitalized since last visit: No Patient Has Alerts: No Implantable device outside of the clinic No excluding cellular tissue based products placed in the center since last visit: Has Dressing in Place as Prescribed: Yes Has Compression in Place as Prescribed: Yes Has Footwear/Offloading in Place as Yes Prescribed: Right: Surgical Shoe with Pressure Relief Insole Pain Present Now: Yes Electronic Signature(s) Signed: 09/10/2021 5:49:24 PM By: Deon Pilling RN, BSN Entered By: Deon Pilling on 09/10/2021 14:23:07 -------------------------------------------------------------------------------- Clinic Level of Care Assessment Details Patient Name: Date of Service: Jennifer CINDRA, [name] 09/10/2021 2:30 PM Medical Record Number: [id number] Patient Account Number: [id number] Date of Birth/Sex: Treating RN: 07-02-1961 (60 y.o. [name], Meta.Reding Primary Care Draeden Kellman: [name] Other Clinician: Referring Jenilyn Magana: Treating  Delorean Knutzen/Extender: [name] in Treatment: 1 Clinic Level of Care Assessment Items TOOL 4 Quantity Score X- 1 0 Use when only an EandM is performed on FOLLOW-UP visit ASSESSMENTS - Nursing Assessment / Reassessment X- 1 10 Reassessment of Co-morbidities (includes updates in patient status) X- 1 5 Reassessment of Adherence to Treatment Plan ASSESSMENTS - Wound and Skin A ssessment / Reassessment X - Simple Wound Assessment / Reassessment - one wound 1 5 []  - 0 Complex Wound Assessment / Reassessment - multiple wounds X- 1 10 Dermatologic / Skin Assessment (not related to wound area) ASSESSMENTS - Focused Assessment X- 1 5 Circumferential Edema Measurements - multi extremities X- 1 10 Nutritional Assessment / Counseling / Intervention []  - 0 Lower Extremity Assessment (monofilament, tuning fork, pulses) []  - 0 Peripheral Arterial Disease Assessment (using hand held doppler) ASSESSMENTS - Ostomy and/or Continence Assessment and Care []  - 0 Incontinence Assessment and Management []  - 0 Ostomy Care Assessment and Management (repouching, etc.) PROCESS - Coordination of Care X - Simple Patient / Family Education for ongoing care 1 15 []  - 0 Complex (extensive) Patient / Family Education for ongoing care X- 1 10 Staff obtains Programmer, systems, Records, T Results / Process Orders est []  - 0 Staff telephones HHA, Nursing Homes / Clarify orders / etc []  - 0 Routine Transfer to another Facility (non-emergent condition) []  - 0 Routine Hospital Admission (non-emergent condition) []  - 0 New Admissions / Biomedical engineer / Ordering NPWT Apligraf, etc. , []  - 0 Emergency Hospital Admission (emergent condition) X- 1 10 Simple Discharge Coordination []  - 0 Complex (extensive) Discharge Coordination PROCESS - Special Needs []  - 0 Pediatric / Minor Patient Management []  - 0 Isolation Patient Management []  - 0 Hearing / Language / Visual special  needs []  - 0 Assessment of Community assistance (transportation, D/C planning, etc.) []  - 0 Additional assistance / Altered mentation []  - 0 Support Surface(s) Assessment (bed,  cushion, seat, etc.) INTERVENTIONS - Wound Cleansing / Measurement X - Simple Wound Cleansing - one wound 1 5 []  - 0 Complex Wound Cleansing - multiple wounds X- 1 5 Wound Imaging (photographs - any number of wounds) []  - 0 Wound Tracing (instead of photographs) X- 1 5 Simple Wound Measurement - one wound []  - 0 Complex Wound Measurement - multiple wounds INTERVENTIONS - Wound Dressings X - Small Wound Dressing one or multiple wounds 1 10 []  - 0 Medium Wound Dressing one or multiple wounds []  - 0 Large Wound Dressing one or multiple wounds []  - 0 Application of Medications - topical []  - 0 Application of Medications - injection INTERVENTIONS - Miscellaneous []  - 0 External ear exam []  - 0 Specimen Collection (cultures, biopsies, blood, body fluids, etc.) []  - 0 Specimen(s) / Culture(s) sent or taken to Lab for analysis []  - 0 Patient Transfer (multiple staff / Civil Service fast streamer / Similar devices) []  - 0 Simple Staple / Suture removal (25 or less) []  - 0 Complex Staple / Suture removal (26 or more) []  - 0 Hypo / Hyperglycemic Management (close monitor of Blood Glucose) []  - 0 Ankle / Brachial Index (ABI) - do not check if billed separately X- 1 5 Vital Signs Has the patient been seen at the hospital within the last three years: Yes Total Score: 110 Level Of Care: New/Established - Level 3 Electronic Signature(s) Signed: 09/10/2021 5:49:24 PM By: Deon Pilling RN, BSN Entered By: Deon Pilling on 09/10/2021 14:38:37 -------------------------------------------------------------------------------- Encounter Discharge Information Details Patient Name: Date of Service: Jennifer Reader L. 09/10/2021 2:30 PM Medical Record Number: [id number] Patient Account Number: [id number] Date of Birth/Sex:  Treating RN: [birth year] (60 y.o. Jennifer [name] Primary Care Jermery Caratachea: [name] Other Clinician: Referring Cai Anfinson: Treating Merrin Mcvicker/Extender: [name] in Treatment: 1 Encounter Discharge Information Items Discharge Condition: Stable Ambulatory Status: Wheelchair Discharge Destination: Home Transportation: Private Auto Accompanied By: self Schedule Follow-up Appointment: Yes Clinical Summary of Care: Electronic Signature(s) Signed: 09/10/2021 5:49:24 PM By: Deon Pilling RN, BSN Entered By: Deon Pilling on 09/10/2021 14:39:26 -------------------------------------------------------------------------------- Lower Extremity Assessment Details Patient Name: Date of Service: Jennifer RRA CHELESA, WEINGARTNER 09/10/2021 2:30 PM Medical Record Number: [id number] Patient Account Number: [id number] Date of Birth/Sex: Treating RN: Nov 15, 1961 (60 y.o. [name], Tammi Klippel Primary Care Tobiah Celestine: [name] Other Clinician: Referring Shaan Rhoads: Treating Harvie Morua/Extender: [name] in Treatment: 1 Edema Assessment Assessed: Shirlyn Goltz: No] [Right: Yes] Edema: [Left: Ye] [Right: s] Calf Left: Right: Point of Measurement: From Medial Instep 47.5 cm Ankle Left: Right: Point of Measurement: From Medial Instep 33 cm Vascular Assessment Pulses: Dorsalis Pedis Palpable: [Right:Yes] Electronic Signature(s) Signed: 09/10/2021 5:49:24 PM By: Deon Pilling RN, BSN Entered By: Deon Pilling on 09/10/2021 14:25:02 -------------------------------------------------------------------------------- Multi Wound Chart Details Patient Name: Date of Service: [name]. 09/10/2021 2:30 PM Medical Record Number: [id number] Patient Account Number: [id number] Date of Birth/Sex: Treating RN: 05/15/1961 (60 y.o. Jennifer [name] Primary Care Myrikal Messmer: [name] Other Clinician: Referring Ellin Fitzgibbons: Treating Myrtle Haller/Extender: [name] in Treatment: 1 Vital Signs Height(in): 78 Capillary Blood Glucose(mg/dl): 159 Weight(lbs): 240 Pulse(bpm): 48 Body Mass Index(BMI): 63 Blood Pressure(mmHg): 147/80 Temperature(F): 97.9 Respiratory Rate(breaths/min): 20 Photos: [N/A:N/A] Right T Fourth oe N/A N/A Wound Location: Gradually Appeared N/A N/A Wounding Event: Diabetic Wound/Ulcer of the Lower N/A N/A Primary Etiology: Extremity Cataracts, Glaucoma, Chronic sinus N/A N/A Comorbid History: problems/congestion, Anemia, Asthma, Chronic  Obstructive Pulmonary Disease (COPD), Congestive Heart Failure, Hypertension, Peripheral Arterial Disease, Peripheral Venous Disease, Type II Diabetes, Rheumatoid Arthritis, Osteoarthritis, Osteomyelitis, Neuropathy, Seizure Disorder 06/21/2021 N/A N/A Date Acquired: 1 N/A N/A Weeks of Treatment: Open N/A N/A Wound Status: 0.6x0.7x0.2 N/A N/A Measurements L x W x D (cm) 0.33 N/A N/A A (cm) : rea 0.066 N/A N/A Volume (cm) : 14.30% N/A N/A % Reduction in A rea: 14.30% N/A N/A % Reduction in Volume: Grade 2 N/A N/A Classification: Medium N/A N/A Exudate A mount: Serosanguineous N/A N/A Exudate Type: red, brown N/A N/A Exudate Color: Distinct, outline attached N/A N/A Wound Margin: Small (1-33%) N/A N/A Granulation Amount: Pink, Pale, Friable N/A N/A Granulation Quality: Large (67-100%) N/A N/A Necrotic Amount: Fat Layer (Subcutaneous Tissue): Yes N/A N/A Exposed Structures: Fascia: No Tendon: No Muscle: No Joint: No Bone: No None N/A N/A Epithelialization: Treatment Notes Wound #22 (Toe Fourth) Wound Laterality: Right Cleanser Soap and Water Discharge Instruction: May shower and wash wound with dial antibacterial soap and water prior to dressing change. Wound Cleanser Discharge Instruction: Cleanse the wound with wound cleanser prior to applying a clean dressing using gauze sponges, not tissue or cotton  balls. Peri-Wound Care Topical Gentamicin Discharge Instruction: As directed by physician Primary Dressing Hydrofera Blue Ready Foam, 2.5 x2.5 in Discharge Instruction: ***APPLY GENTAMICIN OINTMENT UNDER THE HYDROFERA BLUE.***Apply to wound bed as instructed Secondary Dressing Woven Gauze Sponges 2x2 in Discharge Instruction: Apply over primary dressing as directed. Secured With 53M [place] Surgical T ape, 2x2 (in/yd) Discharge Instruction: Secure dressing with tape as directed. Compression Wrap Compression Stockings Add-Ons Electronic Signature(s) Signed: 09/10/2021 3:04:40 PM By: Kalman Shan DO Signed: 09/10/2021 5:49:24 PM By: Deon Pilling RN, BSN Entered By: Kalman Shan on 09/10/2021 14:53:36 -------------------------------------------------------------------------------- Multi-Disciplinary Care Plan Details Patient Name: Date of Service: [name] 09/10/2021 2:30 PM Medical Record Number: [id number] Patient Account Number: [id number] Date of Birth/Sex: Treating RN: July 18, 1961 (60 y.o. Jennifer [name] Primary Care Shaliyah Taite: [name] Other Clinician: Referring Khadim Lundberg: Treating Sonny Poth/Extender: [name] in Treatment: 1 Active Inactive Electronic Signature(s) Signed: 09/19/2021 5:34:01 PM By: Deon Pilling RN, BSN Previous Signature: 09/10/2021 5:49:24 PM Version By: Deon Pilling RN, BSN Entered By: Deon Pilling on 09/19/2021 17:34:01 -------------------------------------------------------------------------------- Pain Assessment Details Patient Name: Date of Service: Jennifer Reader L. 09/10/2021 2:30 PM Medical Record Number: [id number] Patient Account Number: [id number] Date of Birth/Sex: Treating RN: [birth year] (60 y.o. Jennifer [name] Primary Care Tashea Othman: [name] Other Clinician: Referring Keirstin Musil: Treating Marye Eagen/Extender: [name] in  Treatment: 1 Active Problems Location of Pain Severity and Description of Pain Patient Has Paino Yes Site Locations Rate the pain. Current Pain Level: 9 Worst Pain Level: 9 Least Pain Level: 0 Tolerable Pain Level: 8 Pain Management and Medication Current Pain Management: Medication: No Cold Application: No Rest: No Massage: No Activity: No T.E.N.S.: No Heat Application: No Leg drop or elevation: No Is the Current Pain Management Adequate: Adequate How does your wound impact your activities of daily livingo Sleep: No Bathing: No Appetite: No Relationship With Others: No Bladder Continence: No Emotions: No Bowel Continence: No Work: No Toileting: No Drive: No Dressing: No Hobbies: No Electronic Signature(s) Signed: 09/10/2021 5:49:24 PM By: Deon Pilling RN, BSN Entered By: Deon Pilling on 09/10/2021 14:23:39 -------------------------------------------------------------------------------- Patient/Caregiver Education Details Patient Name: Date of Service: [name] 9/20/2022andnbsp2:30 PM Medical Record Number: [id number] Patient Account Number: [id number]  Date of Birth/Gender: Treating RN: 09-Mar-1961 (60 y.o. Jennifer [name] Primary Care Physician: [name] Other Clinician: Referring Physician: Treating Physician/Extender: [name] in Treatment: 1 Education Assessment Education Provided To: Patient Education Topics Provided Infection: Handouts: CDC antimicrobial patient education_English, Infection Prevention and Management Methods: Explain/Verbal, Printed Responses: Reinforcements needed Electronic Signature(s) Signed: 09/10/2021 5:49:24 PM By: Deon Pilling RN, BSN Entered By: Deon Pilling on 09/10/2021 14:29:03 -------------------------------------------------------------------------------- Wound Assessment Details Patient Name: Date of Service: [name]. 09/10/2021 2:30 PM Medical Record  Number: [id number] Patient Account Number: [id number] Date of Birth/Sex: Treating RN: 1961/03/25 (60 y.o. [name], Meta.Reding Primary Care Garyson Stelly: [name] Other Clinician: Referring Yer Olivencia: Treating Sholom Dulude/Extender: [name] in Treatment: 1 Wound Status Wound Number: 22 Primary Diabetic Wound/Ulcer of the Lower Extremity Etiology: Wound Location: Right T Fourth oe Wound Open Wounding Event: Gradually Appeared Status: Date Acquired: 06/21/2021 Comorbid Cataracts, Glaucoma, Chronic sinus problems/congestion, Anemia, Weeks Of Treatment: 1 History: Asthma, Chronic Obstructive Pulmonary Disease (COPD), Clustered Wound: No Congestive Heart Failure, Hypertension, Peripheral Arterial Disease, Peripheral Venous Disease, Type II Diabetes, Rheumatoid Arthritis, Osteoarthritis, Osteomyelitis, Neuropathy, Seizure Disorder Photos Wound Measurements Length: (cm) 0.6 Width: (cm) 0.7 Depth: (cm) 0.2 Area: (cm) 0.33 Volume: (cm) 0.066 % Reduction in Area: 14.3% % Reduction in Volume: 14.3% Epithelialization: None Tunneling: No Undermining: No Wound Description Classification: Grade 2 Wound Margin: Distinct, outline attached Exudate Amount: Medium Exudate Type: Serosanguineous Exudate Color: red, brown Foul Odor After Cleansing: No Slough/Fibrino Yes Wound Bed Granulation Amount: Small (1-33%) Exposed Structure Granulation Quality: Pink, Pale, Friable Fascia Exposed: No Necrotic Amount: Large (67-100%) Fat Layer (Subcutaneous Tissue) Exposed: Yes Necrotic Quality: Adherent Slough Tendon Exposed: No Muscle Exposed: No Joint Exposed: No Bone Exposed: No Electronic Signature(s) Signed: 09/10/2021 5:49:24 PM By: Deon Pilling RN, BSN Entered By: Deon Pilling on 09/10/2021 14:32:47 -------------------------------------------------------------------------------- Vitals Details Patient Name: Date of Service: Jennifer Reader L. 09/10/2021  2:30 PM Medical Record Number: [id number] Patient Account Number: [id number] Date of Birth/Sex: Treating RN: 31-Mar-1961 (60 y.o. [name], Tammi Klippel Primary Care Dorothea Yow: [name] Other Clinician: Referring Alexsander Cavins: Treating Diamonds Lippard/Extender: [name] in Treatment: 1 Vital Signs Time Taken: 14:20 Temperature (F): 97.9 Height (in): 67 Pulse (bpm): 85 Weight (lbs): 240 Respiratory Rate (breaths/min): 20 Body Mass Index (BMI): 37.6 Blood Pressure (mmHg): 147/80 Capillary Blood Glucose (mg/dl): 159 Reference Range: 80 - 120 mg / dl Electronic Signature(s) Signed: 09/10/2021 5:49:24 PM By: Deon Pilling RN, BSN Entered By: Deon Pilling on 09/10/2021 14:23:28

## 2021-09-13 NOTE — Telephone Encounter (Signed)
Number not in service at this time, attempted several times to call.

## 2021-09-17 ENCOUNTER — Other Ambulatory Visit: Payer: Self-pay

## 2021-09-17 ENCOUNTER — Ambulatory Visit (HOSPITAL_COMMUNITY)
Admission: RE | Admit: 2021-09-17 | Discharge: 2021-09-17 | Disposition: A | Discharge status: Home / Self Care | Payer: Medicare Other | Source: Ambulatory Visit | Attending: Internal Medicine | Admitting: Internal Medicine

## 2021-09-17 DIAGNOSIS — L97519 Non-pressure chronic ulcer of other part of right foot with unspecified severity: Secondary | ICD-10-CM | POA: Insufficient documentation

## 2021-09-17 DIAGNOSIS — E11621 Type 2 diabetes mellitus with foot ulcer: Secondary | ICD-10-CM

## 2021-09-19 ENCOUNTER — Ambulatory Visit (INDEPENDENT_AMBULATORY_CARE_PROVIDER_SITE_OTHER): Payer: Medicare Other | Admitting: Internal Medicine

## 2021-09-19 ENCOUNTER — Encounter: Payer: Self-pay | Admitting: Dietician

## 2021-09-19 ENCOUNTER — Ambulatory Visit (INDEPENDENT_AMBULATORY_CARE_PROVIDER_SITE_OTHER): Payer: Medicare Other | Admitting: Dietician

## 2021-09-19 VITALS — BP 145/63 | HR 86 | Temp 98.2°F | Ht 65.0 in | Wt 237.9 lb

## 2021-09-19 DIAGNOSIS — E669 Obesity, unspecified: Secondary | ICD-10-CM

## 2021-09-19 DIAGNOSIS — E1159 Type 2 diabetes mellitus with other circulatory complications: Secondary | ICD-10-CM

## 2021-09-19 DIAGNOSIS — E1151 Type 2 diabetes mellitus with diabetic peripheral angiopathy without gangrene: Secondary | ICD-10-CM

## 2021-09-19 DIAGNOSIS — I5032 Chronic diastolic (congestive) heart failure: Secondary | ICD-10-CM

## 2021-09-19 DIAGNOSIS — Z794 Long term (current) use of insulin: Secondary | ICD-10-CM

## 2021-09-19 DIAGNOSIS — E785 Hyperlipidemia, unspecified: Secondary | ICD-10-CM

## 2021-09-19 DIAGNOSIS — I739 Peripheral vascular disease, unspecified: Secondary | ICD-10-CM

## 2021-09-19 DIAGNOSIS — E1165 Type 2 diabetes mellitus with hyperglycemia: Secondary | ICD-10-CM | POA: Diagnosis not present

## 2021-09-19 DIAGNOSIS — IMO0002 Reserved for concepts with insufficient information to code with codable children: Secondary | ICD-10-CM

## 2021-09-19 DIAGNOSIS — R269 Unspecified abnormalities of gait and mobility: Secondary | ICD-10-CM

## 2021-09-19 DIAGNOSIS — Z9889 Other specified postprocedural states: Secondary | ICD-10-CM

## 2021-09-19 DIAGNOSIS — E1169 Type 2 diabetes mellitus with other specified complication: Secondary | ICD-10-CM

## 2021-09-19 DIAGNOSIS — E118 Type 2 diabetes mellitus with unspecified complications: Secondary | ICD-10-CM

## 2021-09-19 DIAGNOSIS — E1121 Type 2 diabetes mellitus with diabetic nephropathy: Secondary | ICD-10-CM

## 2021-09-19 DIAGNOSIS — F332 Major depressive disorder, recurrent severe without psychotic features: Secondary | ICD-10-CM

## 2021-09-19 DIAGNOSIS — I152 Hypertension secondary to endocrine disorders: Secondary | ICD-10-CM

## 2021-09-19 DIAGNOSIS — Z23 Encounter for immunization: Secondary | ICD-10-CM

## 2021-09-19 DIAGNOSIS — K635 Polyp of colon: Secondary | ICD-10-CM

## 2021-09-19 DIAGNOSIS — F1120 Opioid dependence, uncomplicated: Secondary | ICD-10-CM

## 2021-09-19 DIAGNOSIS — E1122 Type 2 diabetes mellitus with diabetic chronic kidney disease: Secondary | ICD-10-CM

## 2021-09-19 DIAGNOSIS — L97519 Non-pressure chronic ulcer of other part of right foot with unspecified severity: Secondary | ICD-10-CM

## 2021-09-19 DIAGNOSIS — N3946 Mixed incontinence: Secondary | ICD-10-CM

## 2021-09-19 DIAGNOSIS — N183 Chronic kidney disease, stage 3 unspecified: Secondary | ICD-10-CM

## 2021-09-19 LAB — POCT GLYCOSYLATED HEMOGLOBIN (HGB A1C): Hemoglobin A1C: 7 % — AB (ref 4.0–5.6)

## 2021-09-19 MED ORDER — ROSUVASTATIN CALCIUM 20 MG PO TABS: 20.0000 mg | ORAL_TABLET | Freq: Every morning | ORAL | 3 refills | Status: DC

## 2021-09-19 MED ORDER — BUPROPION HCL ER (XL) 300 MG PO TB24: 300.0000 mg | ORAL_TABLET | Freq: Every day | ORAL | 3 refills | Status: DC

## 2021-09-19 MED ORDER — ONETOUCH ULTRASOFT LANCETS MISC: 12 refills | Status: DC

## 2021-09-19 MED ORDER — INSULIN PEN NEEDLE 32G X 4 MM MISC: 3 refills | Status: DC

## 2021-09-19 MED ORDER — AMLODIPINE BESYLATE 10 MG PO TABS: 10.0000 mg | ORAL_TABLET | Freq: Every day | ORAL | 3 refills | Status: DC

## 2021-09-19 MED ORDER — DOXYCYCLINE HYCLATE 100 MG PO TABS: 100.0000 mg | ORAL_TABLET | Freq: Two times a day (BID) | ORAL | 0 refills | Status: DC

## 2021-09-19 MED ORDER — ASPIRIN EC 81 MG PO TBEC: 81.0000 mg | DELAYED_RELEASE_TABLET | Freq: Every day | ORAL | Status: DC

## 2021-09-19 NOTE — Progress Notes (Signed)
[employer] Personal CGM Training  Start time:1000    End time: 1100 Total time: [place] was educated about the following:  -Getting to know device    (phone programmed ) -Setting up device , trend arrows - sensor interaction with vitamin C: do not take more than 500 mg vitamin C daily -Setting alert profile (high alert  240 , low alert 85)  setting up reminders (reminders for 6x/day testing  ) -Inserting sensor (patient applied the sensor on his left upper back of arm  himself today with minimal assist. It appeared to be working and in warm up when he left the office) - using meter when test blood sugar symbol appears -Ending sensor session -Trouble shooting -Tape guide, ability to upload from home with email address (he says he nor wife do email)  -Reviewed insulin dosing from [place]  Patient has [place] tech support and my contact information. Follow up was arranged in 2 weeks   Lab Results  Component Value Date   HGBA1C 7.4 (A) 07/11/2021   HGBA1C 8.0 (H) 03/04/2021   HGBA1C 7.8 (A) 01/17/2021   HGBA1C 7.1 (A) 10/24/2020   HGBA1C 8.5 (A) 04/12/2020    Patient scanned at least 5 times before leaving the office with results ~ 200- 205 mg/dl and trend arrow flat.  Diabetes Self-Management Education  Visit Type: Follow-up (2nd visit in annual reveiw)   09/19/2021  [name], identified by name and date of birth, is a 60 y.o. female with a diagnosis of Diabetes:  .   ASSESSMENT Wt Readings from Last 10 Encounters:  09/19/21 237 lb 14.4 oz (107.9 kg)  08/16/21 248 lb (112.5 kg)  07/11/21 248 lb (112.5 kg)  06/15/21 240 lb 15.4 oz (109.3 kg)  04/26/21 257 lb 9.6 oz (116.8 kg)  04/04/21 253 lb 1.6 oz (114.8 kg)  03/02/21 260 lb (117.9 kg)  02/14/21 268 lb 12.8 oz (121.9 kg)  01/17/21 265 lb 8 oz (120.4 kg)  11/20/20 261 lb 14.4 oz (118.8 kg)   Weight loss with Ozempic, consider increasing dose if needed   Diabetes Self-Management  Education - 09/19/21 1200       Visit Information   Visit Type Follow-up   2nd visit in annual reveiw     Health Coping   How would you rate your overall health? Fair   pending amputation of toe     Patient Education   Medications Reviewed patients medication for diabetes, action, purpose, timing of dose and side effects.    Monitoring Other (comment)   see Cgm training   Acute complications Discussed and identified patients' treatment of hyperglycemia.    Personal strategies to promote health Helped patient develop diabetes management plan for (enter comment)   using CGM and controlling blood sugars     Individualized Goals (developed by patient)   Monitoring  test my blood glucose as discussed      Post-Education Assessment   Patient understands using medications safely. Demonstrates understanding / competency    Patient understands monitoring blood glucose, interpreting and using results Demonstrates understanding / competency    Patient understands prevention, detection, and treatment of acute complications. Demonstrates understanding / competency    Patient understands how to develop strategies to promote health/change behavior. Demonstrates understanding / competency      Outcomes   Expected Outcomes Demonstrated interest in learning. Expect positive outcomes    Future DMSE 2 wks    Program Status Not Completed  Subsequent Visit   Since your last visit have you continued or begun to take your medications as prescribed? Yes    Since your last visit have you had your blood pressure checked? No    Since your last visit have you experienced any weight changes? No change    Since your last visit, are you checking your blood glucose at least once a day? No   cannot find her meter            Individualized Plan for Diabetes Self-Management Training:   Learning Objective:  Patient will have a greater understanding of diabetes self-management. Patient education plan is to  attend individual and/or group sessions per assessed needs and concerns.   Plan:   There are no Patient Instructions on file for this visit.  Expected Outcomes:  Demonstrated interest in learning. Expect positive outcomes  Education material provided: Diabetes Resources  If problems or questions, patient to contact team via:  Phone  Future DSME appointment: 2 wks [name], [place] 09/19/2021 12:02 PM.
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
## 2021-09-19 NOTE — Assessment & Plan Note (Signed)
Ongoing severely impactful UI continues, not responsive to posterior tibial nerve stimulation or to first injection of Botox - 2nd is scheduled.  She is despondent.  Suprapubic catheter should be considered, though not ideal.

## 2021-09-19 NOTE — Assessment & Plan Note (Signed)
Depressed mood is exacerbated by declining health over the past several years compounded by recent non-healing toe osteomyelitis and fear over loss of toe or foot.  Very tearful today which is understandable.  She was given time to process and was provided with emotional support.

## 2021-09-19 NOTE — Assessment & Plan Note (Signed)
Gait further impaired by recent R 4th toe ulcer leading to osteomyelitis.  Continues to fall, 4 times just this week.  Motorized WC application was submitted, denied by [facility] as she didn't meet criteria for the tilt feature.  We will appeal for a motorized WC without tilt.  She will certainly need this, particularly if she loses another toe or her forefoot.

## 2021-09-19 NOTE — Patient Instructions (Signed)
Jennifer [name], I'm so sorry that you are going through this very difficulty time with your health, fearing that you will lose a toe or even a foot.  It is important to start antibiotic.  You have had this before, and it will not cure the infection, but it can help to contain the infection to some degree.  I agree that you'll need to speak to Dr. Amalia Hailey soon.    We'll check your A1C today and determine whether the Humalog needs to continue.  I'll call you with the result.  I wish I could make things different, Jennifer [name].  I truly do.  Dr. Jimmye Norman

## 2021-09-19 NOTE — Assessment & Plan Note (Signed)
Successful weight loss with ozempic - about 260# 1 yr ago, now 238#.  She has had some fluctuation in RLE edema during this time, though doesn't use diuretic due to polyuria and UI with difficulty getting to the bathroom.

## 2021-09-19 NOTE — Assessment & Plan Note (Signed)
eGFR improved last month to > 60 after cessation of ACEI.  Monitor before resolving problem.

## 2021-09-19 NOTE — Assessment & Plan Note (Signed)
No orthopnea, rales, or JVD; RLE edema is not as taught as prior visits though remains 3+ and probably represents more dependent edema than heart failure.  Monitor.

## 2021-09-19 NOTE — Assessment & Plan Note (Signed)
Ulcer has not healed and MRI earlier this week ordered by wound clinic Dr. Heber [place] shows osteomyelitis.  Will prescribe doxycycline to help contain (will not cure) infection while plans are made with her podiatrist Dr. Amalia Hailey.  Arterial circulation was assessed last month.  Trace DP pulse today.  Toes are warm.  R 4th toe is dark and tender.  No surrounding erythema or significant swelling.  I'm hopeful that a ray amputation will suffice.  She fears loss of her entire foot.

## 2021-09-19 NOTE — Assessment & Plan Note (Signed)
Longstanding chronic opioid therapy continues.  She does not request early refills and reports that she takes 2-3 Percocets daily.  Pain is multifocal but her back has been a significant problem and she has not responded to spinal injections. More recently her R 4th toe has been painful (osteomyelitis).  Post-op pain control for any upcoming toe or forefoot amputation will need to take her baseline opioid use into consideration - higher doses will be required.

## 2021-09-19 NOTE — Assessment & Plan Note (Signed)
Colonoscopy overdue, postponed due to ongoing toe ulcer and now osteomyelitis.

## 2021-09-19 NOTE — Assessment & Plan Note (Addendum)
R dp pulse today trace, stronger in the past.  Recent arterial dopplers completed at vein and vascular. R ABI 0.9.  Continue antiplatelet, statin, DM and HTN control.

## 2021-09-19 NOTE — Assessment & Plan Note (Signed)
Suboptimal SBP 145 w/o intended recheck, low at last visit 07/2021, no changes today though goal is SBP < 130. Doesn't tolerate diuretic due to urinary issues.

## 2021-09-19 NOTE — Assessment & Plan Note (Addendum)
A1c today 7, with minimal use (or none?) of meal coverage.  To simplify regimen, will discontinue Humalog at this time.  She will continue with Tresiba at 70 units daily, ozemipic 0.5 mg weekly, and metformin 500 mg bid with plan to increase to 1000 mg bid.  The patient is currently using Continuous Glucose Monitoring. SHe is injecting insulin up to 3 times a day. The patient is making adjustments to their diabetes regimen based on glucose readings.

## 2021-09-23 ENCOUNTER — Telehealth: Payer: Self-pay | Admitting: *Deleted

## 2021-09-23 ENCOUNTER — Other Ambulatory Visit: Payer: Self-pay | Admitting: Podiatry

## 2021-09-23 ENCOUNTER — Other Ambulatory Visit: Payer: Self-pay

## 2021-09-23 ENCOUNTER — Ambulatory Visit (INDEPENDENT_AMBULATORY_CARE_PROVIDER_SITE_OTHER): Payer: Medicare Other

## 2021-09-23 ENCOUNTER — Ambulatory Visit (INDEPENDENT_AMBULATORY_CARE_PROVIDER_SITE_OTHER): Payer: Medicare Other | Admitting: Podiatry

## 2021-09-23 ENCOUNTER — Telehealth: Payer: Self-pay | Admitting: Urology

## 2021-09-23 DIAGNOSIS — M86171 Other acute osteomyelitis, right ankle and foot: Secondary | ICD-10-CM | POA: Diagnosis not present

## 2021-09-23 DIAGNOSIS — E08621 Diabetes mellitus due to underlying condition with foot ulcer: Secondary | ICD-10-CM

## 2021-09-23 DIAGNOSIS — Z89512 Acquired absence of left leg below knee: Secondary | ICD-10-CM

## 2021-09-23 DIAGNOSIS — L97512 Non-pressure chronic ulcer of other part of right foot with fat layer exposed: Secondary | ICD-10-CM | POA: Diagnosis not present

## 2021-09-23 NOTE — Telephone Encounter (Signed)
Patient is calling and requesting to have her upcoming surgery at hospital instead of the surgery center since she is a diabetic. Please advise.

## 2021-09-23 NOTE — Telephone Encounter (Signed)
DOS - 09/25/21  AMPUTATION TOE MPJ JOINT 4TH RIGHT --- 28820  [facility] EFFECTIVE DATE - 12/22/20   PLAN DEDUCTIBLE - $0.00 OUT OF POCKET - $7,550.00 W/ $4,271.56 REMAINING COINSURANCE - 20% COPAY - $0.00   PER [employer] WEBSITE FOR CPT CODE 64830 Notification or Prior Authorization is not required for the requested services  Decision ID #:[id number]

## 2021-09-24 ENCOUNTER — Encounter (HOSPITAL_BASED_OUTPATIENT_CLINIC_OR_DEPARTMENT_OTHER): Payer: Medicare Other | Admitting: Internal Medicine

## 2021-09-25 ENCOUNTER — Other Ambulatory Visit: Payer: Self-pay | Admitting: Podiatry

## 2021-09-25 ENCOUNTER — Telehealth: Payer: Self-pay | Admitting: *Deleted

## 2021-09-25 ENCOUNTER — Encounter: Payer: Self-pay | Admitting: Podiatry

## 2021-09-25 DIAGNOSIS — L97512 Non-pressure chronic ulcer of other part of right foot with fat layer exposed: Secondary | ICD-10-CM

## 2021-09-25 MED ORDER — OXYCODONE-ACETAMINOPHEN 5-325 MG PO TABS: 1.0000 | ORAL_TABLET | ORAL | 0 refills | Status: DC | PRN

## 2021-09-25 NOTE — Telephone Encounter (Signed)
No other options. Unfortunately patient's insurance will not cover. - Dr. Amalia Hailey

## 2021-09-25 NOTE — Progress Notes (Signed)
PRN breakthrough pain postop. One time only.   Edrick Kins, DPM [facility]  Dr. Edrick Kins, DPM    [address], [place] [address]                Office [phone]  Fax [phone]
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
## 2021-09-25 NOTE — Telephone Encounter (Signed)
Returned call to patient to inform her of below. States she only has one tab left. States most days she has been taking one tab q 4 hours since picking up med on 9/15. States she does not have transportation to come back to Pharmacy. She is with her father now, but he lives in [place]. She will call Dr. Amalia Hailey back to see if he can prescribe alternate med but would still like to hear from Dr. Jimmye Norman.

## 2021-09-25 NOTE — Telephone Encounter (Signed)
Rx for additional breakthrough pain sent today. Percocet 5/325mg  Q4H PRN pain #30 one refill only. - Dr. Amalia Hailey

## 2021-09-25 NOTE — Telephone Encounter (Signed)
Patient is calling because she would like something else besides the oxycodone,having a hard time getting that prescription.   Called pharmacy and patient picked up same prescription by a different doctor (Dr [name]) on 09/05/21-30 day supply. Please advise.

## 2021-09-25 NOTE — Telephone Encounter (Signed)
Please advise 

## 2021-09-25 NOTE — Telephone Encounter (Signed)
Received call from patient. States she's sitting outside [facility] and they won't let her have her pain medication. States she just had toes amputated and was given Rx for 30 tabs of oxycodone from surgeon, Dr Amalia Hailey. Pharmacy told her they need permission from Dr. Jimmye Norman. Call placed to [facility]. States patient p/u 100 tabs on 9/15 and State is blocking Rx from going through. If Dr. Jimmye Norman approves it, they can put a code in but insurance still will not cover it. Earliest fill date is 10/02/21. Please advise.

## 2021-09-25 NOTE — Telephone Encounter (Signed)
Prescription sent

## 2021-09-25 NOTE — Telephone Encounter (Signed)
Patient is calling for a refill of her pain medicine(Oxycodone-ace,5-325 mg),only 3 pills remaining, in a lot of pain,having to take more. Please advise.

## 2021-09-26 ENCOUNTER — Telehealth: Payer: Self-pay | Admitting: *Deleted

## 2021-09-26 NOTE — Telephone Encounter (Signed)
Dr. Jimmye Norman, Jennifer [name] is back on the phone, crying, states she's in so much pain she cannot tolerate it. Describes it as sharp, stabbing pain. States [facility] is still waiting for your approval.

## 2021-09-26 NOTE — Telephone Encounter (Signed)
Please call pt back for pain medicine. Pt states she cannot get the medication because it's too early.

## 2021-09-26 NOTE — Telephone Encounter (Signed)
Patient is calling for pain medication request, tylenol not helping. Please advise.  Called patient to inform patient that pain medicine refill was sent to pharmacy on 09/25/21,no answer, mailbox full.

## 2021-09-26 NOTE — Telephone Encounter (Signed)
Returned the call to patient to give information per Dr Amalia Hailey, no answer and could not leave vmessage, mailbox full.

## 2021-09-26 NOTE — Telephone Encounter (Signed)
Received secure chat from Dr. Jimmye Norman that she is calling [facility] on [address] [phone]) now. Patient made aware and she is very Patent attorney.

## 2021-09-27 LAB — WOUND CULTURE
MICRO NUMBER:: 12452442
RESULT:: NO GROWTH
SPECIMEN QUALITY:: ADEQUATE

## 2021-09-27 LAB — HOUSE ACCOUNT TRACKING

## 2021-10-02 ENCOUNTER — Ambulatory Visit: Payer: Medicare Other

## 2021-10-02 ENCOUNTER — Ambulatory Visit (INDEPENDENT_AMBULATORY_CARE_PROVIDER_SITE_OTHER): Payer: Medicare Other | Admitting: *Deleted

## 2021-10-02 ENCOUNTER — Telehealth: Payer: Self-pay | Admitting: *Deleted

## 2021-10-02 ENCOUNTER — Ambulatory Visit (INDEPENDENT_AMBULATORY_CARE_PROVIDER_SITE_OTHER): Payer: Medicare Other | Admitting: Podiatry

## 2021-10-02 ENCOUNTER — Other Ambulatory Visit: Payer: Self-pay

## 2021-10-02 DIAGNOSIS — E08621 Diabetes mellitus due to underlying condition with foot ulcer: Secondary | ICD-10-CM

## 2021-10-02 DIAGNOSIS — E0843 Diabetes mellitus due to underlying condition with diabetic autonomic (poly)neuropathy: Secondary | ICD-10-CM

## 2021-10-02 DIAGNOSIS — L84 Corns and callosities: Secondary | ICD-10-CM

## 2021-10-02 DIAGNOSIS — L97512 Non-pressure chronic ulcer of other part of right foot with fat layer exposed: Secondary | ICD-10-CM

## 2021-10-02 DIAGNOSIS — Z9889 Other specified postprocedural states: Secondary | ICD-10-CM

## 2021-10-02 NOTE — Progress Notes (Signed)
Subjective:  Patient PMHx DM type II, LLE BKA, partial fifth ray amputation RT, presents today status post RT fourth toe amputation. DOS: 09/26/2021.  Patient states that she is doing very well.  She is kept the dressings clean dry and intact.  No new complaints at this time  Past Medical History:  Diagnosis Date   Acute vestibular syndrome, resolved 03/02/2021   Angioedema 06/14/2021   Asthma    Burn of finger of right hand, second degree 02/05/2021   Occurred during cooking (frying), poor sensation due to neuropathy, pt punctured blister to allow it to drain, skin has since desquamated over dorsal joint, no infection.  Keep clean and dry, OTC antibacterial ointment.   Cataract    CHF (congestive heart failure) (HCC)    Chronic bronchitis ([place])    "I get it alot" (09/28/2013)   Chronic diastolic heart failure (HCC)    grade 2 per 2D echocardiogram (01/2013)   Chronic lower back pain    Chronic pain syndrome 12/03/2011   Likely secondary to depression, "fibromyalgia", neuropathy, and obesity. Lumbar MRI 2014 no sig change from prior (2008) : Stable hypertrophic facet disease most notable at L4-5. Stable shallow left foraminal/extraforaminal disc protrusion at L4-5. No direct neural compression.       COPD 01/08/2007   PFT's 05/2007 : FEV1/FVC 82, FEV1 64% pred, FEF 25-75% 40% predicted, 16% improvement in FEV1 with bronchodilators.      Depression    Diabetic peripheral neuropathy (HCC)    Dizziness, resolved (admitted with vestibular migraine)    DVT of upper extremity (deep vein thrombosis) ([address]) 03/11/2013   Secondary to PICC line. Right brachial vein, diagnosed on 03/10/2013 Coumadin for 3 months. End date 06/10/2013    Environmental allergies    Hx: of   Fatty liver 2003   observed on ultrasound abdomen   Fibromyalgia    GERD (gastroesophageal reflux disease)    Glaucoma    History of bacterial endocarditis 2014   Endocarditis involving mitral and tricuspid valves.  S. Aureus  and GBS.    History of use of hearing aid    Hyperlipidemia    Hyperplastic colon polyp 12/2010   Per colonoscopy (12/2010) - Dr. 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 2/2 tobacco use, DM, and cont weight bearing on surgical wound and developed gangrene    Spinal stenosis    Tobacco abuse    Type II diabetes mellitus with peripheral circulatory disorders, uncontrolled DX: 1993   Insulin dep. Poor control. Complicated by diabetic foot ulcer and diabetic eye disease.        Objective/Physical Exam Neurovascular status intact.  Skin incisions appear to be well coapted with sutures intact. No sign of infectious process noted. No dehiscence. No active bleeding noted. Moderate edema noted to the surgical extremity.  Assessment: 1. s/p RT fourth toe amputation. DOS: 09/26/2021   Plan of Care:  1. Patient was evaluated. 2.  Dressings changed.  Patient may begin washing and showering and getting the foot wet 3.  Continue weightbearing in the postsurgical shoe as tolerated 4.  Patient was molded today for custom molded  diabetic shoes and insoles with possible toe fillers to the fourth and fifth toes of the right foot 5.  Return to clinic in 2 weeks for suture removal   Edrick Kins, DPM [facility]  Dr. Edrick Kins, DPM    [address], [place] [address]                Office [phone]  Fax [phone]

## 2021-10-02 NOTE — Telephone Encounter (Signed)
Patient called in stating, "everything tastes the same." C/o coating on tongue. She has appt with [name] tomorrow at [phone] and would like to be seen by Provider as well. Placed on Dr. Joeseph Amor schedule tomorrow at 9:45.

## 2021-10-02 NOTE — Progress Notes (Signed)
Patient presents to the office today for diabetic shoe and insole measuring.  Patient was measured with brannock device to determine size and width for 1 pair of extra depth shoes and foam casted for 3 pair of insoles.   Documentation of medical necessity will be sent to patient's treating diabetic doctor to verify and sign.   Patient's diabetic provider: Dr. Angelica Pou  Shoes and insoles will be ordered at that time and patient will be notified for an appointment for fitting when they arrive.   Shoe size (per patient): 10   Brannock measurement: RIGHT - 9.5 D, LEFT - BKA  Patient shoe selection-   1st choice:   Apex [id number]  2nd choice:  N/A  Shoe size ordered: Women's 9.5 Wide

## 2021-10-03 ENCOUNTER — Other Ambulatory Visit: Payer: Self-pay | Admitting: Internal Medicine

## 2021-10-03 ENCOUNTER — Ambulatory Visit (INDEPENDENT_AMBULATORY_CARE_PROVIDER_SITE_OTHER): Payer: Medicare Other | Admitting: Student

## 2021-10-03 ENCOUNTER — Other Ambulatory Visit: Payer: Self-pay

## 2021-10-03 ENCOUNTER — Encounter: Payer: Self-pay | Admitting: Internal Medicine

## 2021-10-03 ENCOUNTER — Encounter: Payer: Self-pay | Admitting: Student

## 2021-10-03 ENCOUNTER — Ambulatory Visit (INDEPENDENT_AMBULATORY_CARE_PROVIDER_SITE_OTHER): Payer: Medicare Other | Admitting: Dietician

## 2021-10-03 VITALS — BP 147/74 | HR 88 | Temp 98.0°F | Ht 65.0 in | Wt 232.2 lb

## 2021-10-03 DIAGNOSIS — F332 Major depressive disorder, recurrent severe without psychotic features: Secondary | ICD-10-CM

## 2021-10-03 DIAGNOSIS — E118 Type 2 diabetes mellitus with unspecified complications: Secondary | ICD-10-CM | POA: Diagnosis not present

## 2021-10-03 DIAGNOSIS — G8929 Other chronic pain: Secondary | ICD-10-CM

## 2021-10-03 DIAGNOSIS — R634 Abnormal weight loss: Secondary | ICD-10-CM | POA: Insufficient documentation

## 2021-10-03 DIAGNOSIS — Z713 Dietary counseling and surveillance: Secondary | ICD-10-CM

## 2021-10-03 DIAGNOSIS — G3184 Mild cognitive impairment, so stated: Secondary | ICD-10-CM

## 2021-10-03 DIAGNOSIS — Z794 Long term (current) use of insulin: Secondary | ICD-10-CM | POA: Diagnosis not present

## 2021-10-03 DIAGNOSIS — M869 Osteomyelitis, unspecified: Secondary | ICD-10-CM

## 2021-10-03 DIAGNOSIS — Z9641 Presence of insulin pump (external) (internal): Secondary | ICD-10-CM

## 2021-10-03 DIAGNOSIS — E119 Type 2 diabetes mellitus without complications: Secondary | ICD-10-CM | POA: Diagnosis not present

## 2021-10-03 DIAGNOSIS — R432 Parageusia: Secondary | ICD-10-CM

## 2021-10-03 DIAGNOSIS — M5440 Lumbago with sciatica, unspecified side: Secondary | ICD-10-CM

## 2021-10-03 MED ORDER — PROMETHAZINE HCL 25 MG PO TABS: 25.0000 mg | ORAL_TABLET | Freq: Four times a day (QID) | ORAL | 0 refills | Status: DC | PRN

## 2021-10-03 MED ORDER — NYSTATIN 100000 UNIT/ML MT SUSP: 5.0000 mL | Freq: Four times a day (QID) | OROMUCOSAL | 0 refills | Status: AC

## 2021-10-03 MED ORDER — OXYCODONE-ACETAMINOPHEN 5-325 MG PO TABS: 1.0000 | ORAL_TABLET | Freq: Three times a day (TID) | ORAL | 0 refills | Status: DC | PRN

## 2021-10-03 MED ORDER — TRESIBA FLEXTOUCH 200 UNIT/ML ~~LOC~~ SOPN: 40.0000 [IU] | PEN_INJECTOR | Freq: Every day | SUBCUTANEOUS | 8 refills | Status: DC

## 2021-10-03 NOTE — Assessment & Plan Note (Signed)
Patient states she is continuing to struggle with all the stresses in her life currently.  She is tearful as she is concerned about losing her right leg as well.  She also recently lost her brother to metastatic cancer she is struggling with this.  Discussed with her that we can offer counseling sessions with her [place], the patient is interested in this we will place referral at this time.

## 2021-10-03 NOTE — Assessment & Plan Note (Signed)
Assessment S/p amputation of left fourth toe 09/26/21. She followed up with podiatry today and they stated her wound is healing well and she will follow up at the end of the month.   She continues to endorse pain but denies drainage, redness, or swelling. She is requesting a refill of her chronic pain medications. I discussed this with patient's PCP Dr. Jimmye Norman, who will be filling her monthly oxycodone prescription.  Instructed patient to continue to monitor her foot wound.  Plan -Follow up with podiatry in the month -Patient's PCP to refill pain medication prescription -Patient given strict return precautions

## 2021-10-03 NOTE — Assessment & Plan Note (Addendum)
Patient states she has lost 30 pounds over the last 6 months.  She denies B symptoms of night sweats or chills or fevers.  States that this has not been a steadily decline over the past few months.  She notes decreased p.o. intake secondary to nausea.  She denies any difficulty swallowing dysphagia diet aphasia or early satiety.  Low suspicion at this time the patient has esophageal thrush and needs systemic fungal treatment.  Also low suspicion for malignancy as etiology at this time.  We will start by stopping patient Ozempic to see if this improves her nausea and leads to increased p.o. intake.  Discussed this with her PCP who notes that patient has recently had lack of ability to food secondary to financial situations and this is led her to have some weight loss.  We will continue to monitor for other signs and symptoms and etiologies for her weight loss.  Patient follow-up in our clinic in 1 week

## 2021-10-03 NOTE — Progress Notes (Signed)
Diabetes Self-Management Education  Visit Type: Follow-up   10/03/2021  [name], identified by name and date of birth, is a 60 y.o. female with a diagnosis of Diabetes:  .   ASSESSMENT Wt Readings from Last 10 Encounters:  10/03/21 232 lb 3.2 oz (105.3 kg)  09/19/21 237 lb 14.4 oz (107.9 kg)  08/16/21 248 lb (112.5 kg)  07/11/21 248 lb (112.5 kg)  06/15/21 240 lb 15.4 oz (109.3 kg)  04/26/21 257 lb 9.6 oz (116.8 kg)  04/04/21 253 lb 1.6 oz (114.8 kg)  03/02/21 260 lb (117.9 kg)  02/14/21 268 lb 12.8 oz (121.9 kg)  01/17/21 265 lb 8 oz (120.4 kg)   Weight loss with Ozempic, consider increasing dose if needed   Diabetes Self-Management Education - 10/03/21 1500       Visit Information   Visit Type Follow-up      Health Coping   How would you rate your overall health? Fair      Patient Self-Evaluation of Goals - Patient rates self as meeting previously set goals (% of time)   Monitoring >75%      Post-Education Assessment   Patient understands monitoring blood glucose, interpreting and using results Demonstrates understanding / competency      Outcomes   Expected Outcomes Demonstrated interest in learning. Expect positive outcomes    Future DMSE 2 wks    Program Status Completed      Subsequent Visit   Since your last visit have you continued or begun to take your medications as prescribed? No   skipping Humalog and [name] about 3-4 times a week   Since your last visit have you experienced any weight changes? Loss    Weight Loss (lbs) 5   has lost 33 # in past 8 months, 4# per month           Individualized Plan for Diabetes Self-Management Training:   Learning Objective:  Patient will have a greater understanding of diabetes self-management. Patient education plan is to attend individual and/or group sessions per assessed needs and concerns.   Plan:   Patient Instructions  Good job wearing and scanning your Continuous glucose monitor.   You  also did great placing a new sensor today!!!  Please check your blood sugar at least 6 times a day.   Bring a new sensor with you to your next visit.   Jennifer [name] [phone]  Expected Outcomes:  Demonstrated interest in learning. Expect positive outcomes  Education material provided: Diabetes Resources  If problems or questions, patient to contact team via:  Phone  Future DSME appointment: 2 wks [name], RD 10/03/2021 3:38 PM.

## 2021-10-03 NOTE — Patient Instructions (Addendum)
Thank you, Ms.Casia L Tamez for allowing [place] to provide your care today. Today we discussed.  Ritta Slot We will be ordering you nystatin solution to use 4 times daily for 7 days. Please follow up in our clinic next week.   Diabetes Please stop taking your Ozempic at this time.  We will see if this helps with her nausea.  I would also like you to decrease your Tresiba to 40 units daily rather than 70.  Recent Toe Amputation Please continue to follow-up with Dr. Amalia Hailey.  We will be reaching out to Dr. Jimmye Norman about your pain medications.  If you notice you are having any more drainage, pain, redness or swelling of the area please call the podiatrist office.  Stressors Sorry to hear about the passing of your brother.  Please know that we are always here for you.  I will be putting in a referral for Dr. Carolynne Edouard one of our counselors who can help you with this.  If you notice that you are having any worsening of your depression, sadness or having suicidal thoughts, please call our clinic immediately.  Weight Loss Please follow-up in our clinic next week to discuss further your recent weight loss.    I have ordered the following labs for you:  Lab Orders  No laboratory test(s) ordered today    Referrals ordered today:   Referral Orders  No referral(s) requested today     I have ordered the following medication/changed the following medications:   Stop the following medications: Medications Discontinued During This Encounter  Medication Reason   promethazine (PHENERGAN) 25 MG tablet Reorder     Start the following medications: Meds ordered this encounter  Medications   promethazine (PHENERGAN) 25 MG tablet    Sig: Take 1 tablet (25 mg total) by mouth every 6 (six) hours as needed for nausea or vomiting.    Dispense:  30 tablet    Refill:  0     Follow up:  1 week for weight loss    Should you have any questions or concerns please call the internal medicine clinic at  [phone].     Sanjuana Letters, D.O. [employer]

## 2021-10-03 NOTE — Patient Instructions (Signed)
Good job wearing and scanning your Continuous glucose monitor.   You also did great placing a new sensor today!!!  Please check your blood sugar at least 6 times a day.   Bring a new sensor with you to your next visit.   [name] [phone]

## 2021-10-03 NOTE — Progress Notes (Signed)
refill 

## 2021-10-03 NOTE — Assessment & Plan Note (Addendum)
Assessment: Patient states for the last week she has had a burning sensation of her tongue as well as a decrease in taste.  She does endorse that her tongue was coated in a white substance that has now improved.  She states that this is similar to when she has had thrush in the past.  States that the thrush resolved with nystatin solution.  On exam today patient was without white covering of her tongue.  She states that she still has a loss of taste and has some burning sensation.  She denies difficulty swallowing, early satiety, or pain with swallowing.  She does endorse recent weight loss and difficulty with nausea.  Patient's PCP states that she has been working outpatient for her weight loss and believes that it is secondary to her lack of food resources.  Suspect thrush secondary to recent antibiotic use for osteomyelitis and use of our albuterol inhaler.  Differential diagnosis also includes atrophic glossitis secondary to nutritional deficiencies.  We will treat patient for thrush and if no improvement in 1 week when she follows up in our clinic, will consider work-up for atrophic glossitis and nutritional deficiencies secondary to patient's poor p.o. intake.  Plan: -Treat with nystatin solution -If no improvement in 1 week consider work-up for atrophic glossitis and nutritional deficiencies.

## 2021-10-03 NOTE — Assessment & Plan Note (Signed)
Last A1c on 09/19/2021 of 7.  Per patient's CBG results she is having low glucose readings 5% of the time over the course of 14 days.  With her decreased p.o. intake secondary to her nausea and lack of access to food, will decrease Tresiba to 40 units and stop Ozempic to see if this helps with her nausea.  This was discussed with the patient's PCP Dr. Jimmye Norman.  Patient will follow-up in our clinic in 1 week to see how her nausea is doing.

## 2021-10-03 NOTE — Progress Notes (Signed)
CC: Loss of Taste, F/U s/p r. 4th toe amputation  HPI:  [name] is a 60 y.o. female with a past medical history stated below and presents today for multiple concerns including recent loss of taste and white covering of tongue, f/u s/p R. 4th toe amputation. Please see problem based assessment and plan for additional details.  Past Medical History:  Diagnosis Date   Acute vestibular syndrome, resolved 03/02/2021   Angioedema 06/14/2021   Asthma    Burn of finger of right hand, second degree 02/05/2021   Occurred during cooking (frying), poor sensation due to neuropathy, pt punctured blister to allow it to drain, skin has since desquamated over dorsal joint, no infection.  Keep clean and dry, OTC antibacterial ointment.   Cataract    CHF (congestive heart failure) (HCC)    Chronic bronchitis ([place])    "I get it alot" (09/28/2013)   Chronic diastolic heart failure (HCC)    grade 2 per 2D echocardiogram (01/2013)   Chronic lower back pain    Chronic pain syndrome 12/03/2011   Likely secondary to depression, "fibromyalgia", neuropathy, and obesity. Lumbar MRI 2014 no sig change from prior (2008) : Stable hypertrophic facet disease most notable at L4-5. Stable shallow left foraminal/extraforaminal disc protrusion at L4-5. No direct neural compression.       COPD 01/08/2007   PFT's 05/2007 : FEV1/FVC 82, FEV1 64% pred, FEF 25-75% 40% predicted, 16% improvement in FEV1 with bronchodilators.      Depression    Diabetic peripheral neuropathy (HCC)    Dizziness, resolved (admitted with vestibular migraine)    DVT of upper extremity (deep vein thrombosis) ([place]) 03/11/2013   Secondary to PICC line. Right brachial vein, diagnosed on 03/10/2013 Coumadin for 3 months. End date 06/10/2013    Environmental allergies    Hx: of   Fatty liver 2003   observed on ultrasound abdomen   Fibromyalgia    GERD (gastroesophageal reflux disease)    Glaucoma    History of bacterial endocarditis 2014    Endocarditis involving mitral and tricuspid valves.  S. Aureus and GBS.    History of use of hearing aid    Hyperlipidemia    Hyperplastic colon polyp 12/2010   Per colonoscopy (12/2010) - Dr. 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 2/2 tobacco use, DM, and cont weight bearing on surgical wound and developed gangrene    Spinal stenosis    Tobacco abuse    Type II diabetes mellitus with peripheral circulatory disorders, uncontrolled DX: 1993   Insulin dep. Poor control. Complicated by diabetic foot ulcer and diabetic eye disease.      Current Outpatient Medications on File Prior to Visit  Medication Sig Dispense Refill   albuterol (PROAIR HFA) 108 (90 Base) MCG/ACT inhaler INHALE 2 PUFFS BY MOUTH EVERY 6 HOURS AS NEEDED FOR WHEEZING 54 g 3   amLODipine (NORVASC) 10 MG tablet Take 1 tablet (10 mg total) by mouth daily. 90 tablet 3   aspirin EC 81 MG tablet Take 1 tablet (81 mg total) by mouth daily. 30 tablet    Blood Glucose Monitoring Suppl (  [place]) w/Device KIT Check 4 times a day 1 kit 1   buPROPion (WELLBUTRIN XL) 300 MG 24 hr tablet Take 1 tablet (300 mg total) by mouth daily. 90 tablet 3   Cholecalciferol (VITAMIN D) 50 MCG (2000 UT) CAPS Take 1 capsule (2,000 Units total) by mouth daily.     diclofenac Sodium (VOLTAREN) 1 % GEL Apply 2 g topically 4 (four) times daily. For the L elbow painful area. 50 g 1   doxycycline (VIBRA-TABS) 100 MG tablet Take 1 tablet (100 mg total) by mouth 2 (two) times daily. 28 tablet 0   gentamicin cream (GARAMYCIN) 0.1 % Apply 1 application topically 2 (two) times daily. 30 g 1    glucose blood (ONETOUCH VERIO) test strip 1 each by Other route as needed for other. Use as instructed. Tests 3-4 times daily. 100 each 3   hydrochlorothiazide (HYDRODIURIL) 25 MG tablet Take 25 mg by mouth daily.     insulin degludec (TRESIBA FLEXTOUCH) 200 UNIT/ML FlexTouch Pen Inject 70 Units into the skin daily before breakfast. 27 mL 8   Insulin Pen Needle 32G X 4 MM MISC Use to inject insulin 4 times a day and semaglutide once weekly 100 each 3   Lancets (ONETOUCH ULTRASOFT) lancets Use as instructed 100 each 12   metFORMIN (GLUCOPHAGE) 500 MG tablet Take 1 tablet (500 mg total) by mouth 2 (two) times daily. 90 tablet 3   metoprolol succinate (TOPROL-XL) 100 MG 24 hr tablet TAKE 1 TABLET BY MOUTH DAILY. TAKE WITH OR IMMEDIATELY FOLLOWING A MEAL. 90 tablet 3   nystatin powder Apply 1 application topically 3 (three) times daily. 30 g 2   omeprazole (PRILOSEC) 20 MG capsule Take 2 capsules (40 mg total) by mouth daily. 180 capsule 3   oxyCODONE-acetaminophen (PERCOCET/ROXICET) 5-325 MG tablet Take 1 tablet by mouth every 4 (four) hours as needed for severe pain. 1 tab Q4H prn breakthrough pain postop. Date of surgery 09/25/2021. 30 tablet 0   OZEMPIC, 0.25 OR 0.5 MG/DOSE, 2 MG/1.5ML SOPN Inject 0.5 mg into the skin every Monday for 24 doses. 1.5 mL 5   pregabalin (LYRICA) 200 MG capsule TAKE 1 CAPSULE BY MOUTH THREE TIMES A DAY 90 capsule 2   promethazine (PHENERGAN) 25 MG tablet Take 1 tablet (25 mg total) by mouth every 6 (six) hours as needed for nausea or vomiting. 30 tablet 0   rosuvastatin (CRESTOR) 20 MG tablet Take 1 tablet (20 mg total) by mouth in the morning. 90 tablet 3   topiramate (TOPAMAX) 50 MG tablet TAKE 1 TABLET BY MOUTH TWICE A DAY 180 tablet 1   traZODone (DESYREL) 100 MG tablet Take 200 mg by mouth at bedtime as needed for sleep.     umeclidinium-vilanterol (ANORO ELLIPTA) 62.5-25 MCG/INH AEPB Inhale 1 puff into the lungs in the morning. 1 each 6   [DISCONTINUED] Potassium  Chloride ER 20 MEQ TBCR Take 40 mEq by mouth daily. 30 tablet 0   No current facility-administered medications on file prior to visit.   Family History  Problem Relation Age of Onset   Diverticulosis Mother    Diabetes Mother    Hypertension Mother    Congestive Heart Failure Mother    Asthma Father    CAD Sister 2       MI at age 70 per patient.  However, she has not had a stent or CABG.    Heart disease Sister        before age  62   Breast cancer Neg Hx    Social History   Socioeconomic History   Marital status: Divorced    Spouse name: Not on file   Number of children: 2   Years of education: college   Highest education level: Not on file  Occupational History   Occupation: Disability    Comment: previously worked as a Training and development officer  Tobacco Use   Smoking status: Every Day    Packs/day: 1.00    Years: 50.00    Pack years: 50.00    Types: Cigarettes   Smokeless tobacco: Never   Tobacco comments:    1 PPD, 03/02/20 states she is not interested in smoking cessation  Vaping Use   Vaping Use: Never used  Substance and Sexual Activity   Alcohol use: No    Alcohol/week: 0.0 standard drinks   Drug use: No    Types: Marijuana, "Crack" cocaine    Comment: 09/28/2013 "no marijuana since 2011, no crack/cocaine 1989"   Sexual activity: Not Currently  Other Topics Concern   Not on file  Social History Narrative   On disability. Lives with son in [place]. Formerly worked as Training and development officer.    Boyfriend passed away stage 4 cancer 03-15-2013.   S/p L BKA 2014. In wheelchair in paritially suitable apartment.    Social Determinants of Health   Financial Resource Strain: Not on file  Food Insecurity: Not on file  Transportation Needs: Not on file  Physical Activity: Not on file  Stress: Not on file  Social Connections: Not on file  Intimate Partner Violence: Not on file    Review of Systems: ROS negative except for what is noted on the assessment and plan.  Vitals:   10/03/21 0959   BP: (!) 147/74  Pulse: 88  Temp: 98 F (36.7 C)  TempSrc: Oral  SpO2: 97%  Weight: 232 lb 3.2 oz (105.3 kg)  Height: 5' 5"  (1.651 m)   Physical Exam: Constitutional: No acute distress HENT: normocephalic atraumatic, mucous membranes moist Eyes: conjunctiva non-erythematous Neck: supple Cardiovascular: regular rate and rhythm, no m/r/g Pulmonary/Chest: normal work of breathing on room air Abdominal: soft, non-tender, non-distended MSK: normal bulk and tone Neurological: alert & oriented x 3 Skin: warm and dry Psych: Tearful        Assessment & Plan:   See Encounters Tab for problem based charting.  Patient discussed with Dr. Donnita Falls, D.O. [facility], PGY-2 Pager: [phone], Phone: [phone] Date 10/03/2021 Time 10:22 AM

## 2021-10-07 ENCOUNTER — Other Ambulatory Visit: Payer: Self-pay | Admitting: *Deleted

## 2021-10-07 DIAGNOSIS — Z89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): Secondary | ICD-10-CM | POA: Insufficient documentation

## 2021-10-08 ENCOUNTER — Telehealth: Payer: Self-pay

## 2021-10-08 MED ORDER — METFORMIN HCL 500 MG PO TABS: 500.0000 mg | ORAL_TABLET | Freq: Two times a day (BID) | ORAL | 3 refills | Status: DC

## 2021-10-08 MED ORDER — NYSTATIN 100000 UNIT/GM EX POWD: 1.0000 "application " | Freq: Three times a day (TID) | CUTANEOUS | 2 refills | Status: DC

## 2021-10-08 NOTE — Telephone Encounter (Signed)
Returned call to pharmacy, automated message received that pharmacy was experiencing high call volumes.  Nurse left message to call [facility] back [place], RN,BSN

## 2021-10-08 NOTE — Telephone Encounter (Signed)
Jennifer [name] with [facility] requesting to speak with a nurse about Lancets (ONETOUCH ULTRASOFT) lancets. Please call back.

## 2021-10-09 ENCOUNTER — Encounter: Payer: Medicare Other | Admitting: Podiatry

## 2021-10-09 NOTE — Progress Notes (Signed)
Internal Medicine Clinic Attending  Case discussed with Dr. Katsadouros  At the time of the visit.  We reviewed the resident's history and exam and pertinent patient test results.  I agree with the assessment, diagnosis, and plan of care documented in the resident's note.  

## 2021-10-10 ENCOUNTER — Ambulatory Visit (INDEPENDENT_AMBULATORY_CARE_PROVIDER_SITE_OTHER): Payer: Medicare Other | Admitting: Internal Medicine

## 2021-10-10 ENCOUNTER — Other Ambulatory Visit: Payer: Self-pay

## 2021-10-10 ENCOUNTER — Encounter: Payer: Self-pay | Admitting: Internal Medicine

## 2021-10-10 DIAGNOSIS — M7541 Impingement syndrome of right shoulder: Secondary | ICD-10-CM | POA: Diagnosis not present

## 2021-10-10 DIAGNOSIS — R432 Parageusia: Secondary | ICD-10-CM | POA: Diagnosis not present

## 2021-10-10 DIAGNOSIS — G8929 Other chronic pain: Secondary | ICD-10-CM

## 2021-10-10 DIAGNOSIS — M7712 Lateral epicondylitis, left elbow: Secondary | ICD-10-CM | POA: Diagnosis not present

## 2021-10-10 DIAGNOSIS — R634 Abnormal weight loss: Secondary | ICD-10-CM | POA: Diagnosis not present

## 2021-10-10 HISTORY — DX: Other chronic pain: G89.29

## 2021-10-10 MED ORDER — DICLOFENAC SODIUM 1 % EX GEL: 2.0000 g | Freq: Four times a day (QID) | CUTANEOUS | 3 refills | Status: DC

## 2021-10-10 NOTE — Assessment & Plan Note (Signed)
Patient presents to [facility] for new complaint of right shoulder pain. She reports 1 month ago she had a fall at home where she injured her nose and right shoulder. Since this fall, she has had persistent right shoulder pain that has only mildly improved over the last month. She has had difficulty lifting heavier objects and performing daily grooming. She reports two prior rotator cuff surgeries in the R shoulder and states her pain feels similar to when she had rotator cuff injuries. She endorses subjective R arm weakness without numbness or tingling. She takes lyrica and percocet for chronic pain though these have not helped.  On exam today, no joint effusion present. Pain limits shoulder abduction. Pain limits shoulder extension and internal rotation. Passive ROM intact. Pain elicited with external and internal rotation against resistance, strength intact. Positive empty can test. Tenderness to palpation of anterior joint line.  Patient likely has R shoulder impingement given her pattern of pain with active ROM and tenderness to palpation. Her strength is intact making rotator cuff tear unlikely.   Plan: -Voltaren gel -continue activity as tolerated to keep shoulder mobile without over exertion

## 2021-10-10 NOTE — Assessment & Plan Note (Signed)
Patient was evaluated for recent weight loss at last clinic appointment on 10/13. Ozempic was stopped at that time to see if patient's nausea would improve, allowing patient to improve PO intake. She takes Ozempic on Mondays and did not take the medication earlier this week. It should be noted that Ozempic can cause weight loss. Patient also reports low calorie intake recently, eating mostly soups. Patient also has reported food insecurity at past appointments per chart review. Since Ozempic was discontinued, patient has gained 10lbs over the course of 7 days.  Given patient's BMI of 40.39, we will restart the Ozempic since weight loss is a desirable side effect of this medication and there is low concern for pathologic causes of weight loss at this time.

## 2021-10-10 NOTE — Progress Notes (Signed)
CC: follow up for thrush, new right shoulder pain  HPI:  Jennifer [name] is a 60 y.o. female with a past medical history stated below and presents today for follow up after recent diagnosis of presumed thrush and with new complaint of R shoulder pain. Please see problem based assessment and plan for additional details.  Past Medical History:  Diagnosis Date   Acute vestibular syndrome, resolved 03/02/2021   Angioedema 06/14/2021   Asthma    Burn of finger of right hand, second degree 02/05/2021   Occurred during cooking (frying), poor sensation due to neuropathy, pt punctured blister to allow it to drain, skin has since desquamated over dorsal joint, no infection.  Keep clean and dry, OTC antibacterial ointment.   Cataract    CHF (congestive heart failure) (HCC)    Chronic bronchitis ([place])    "I get it alot" (09/28/2013)   Chronic diastolic heart failure (HCC)    grade 2 per 2D echocardiogram (01/2013)   Chronic lower back pain    Chronic pain syndrome 12/03/2011   Likely secondary to depression, "fibromyalgia", neuropathy, and obesity. Lumbar MRI 2014 no sig change from prior (2008) : Stable hypertrophic facet disease most notable at L4-5. Stable shallow left foraminal/extraforaminal disc protrusion at L4-5. No direct neural compression.       COPD 01/08/2007   PFT's 05/2007 : FEV1/FVC 82, FEV1 64% pred, FEF 25-75% 40% predicted, 16% improvement in FEV1 with bronchodilators.      Depression    Diabetic peripheral neuropathy (HCC)    Dizziness, resolved (admitted with vestibular migraine)    DVT of upper extremity (deep vein thrombosis) ([place]) 03/11/2013   Secondary to PICC line. Right brachial vein, diagnosed on 03/10/2013 Coumadin for 3 months. End date 06/10/2013    Environmental allergies    Hx: of   Fatty liver 2003   observed on ultrasound abdomen   Fibromyalgia    GERD (gastroesophageal reflux disease)    Glaucoma    History of bacterial endocarditis 2014   Endocarditis  involving mitral and tricuspid valves.  S. Aureus and GBS.    History of use of hearing aid    Hyperlipidemia    Hyperplastic colon polyp 12/2010   Per colonoscopy (12/2010) - Dr. 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  Evaluated on 07/08/2021 by podiatrist Dr. Amalia Hailey who debrided necrotic tissue and prescribed doxycycline.  He will see her again in 3 weeks.  The location of this ulcer on the dorsal aspect of the toe is somewhat atypical for a purely diabetic foot wound, and she does have a strong DP pulse.  I did not examine h   Type II diabetes mellitus with peripheral circulatory disorders, uncontrolled DX: 1993   Insulin dep. Poor control. Complicated by diabetic foot ulcer and diabetic eye disease.  Current Outpatient Medications on File Prior to Visit  Medication Sig Dispense Refill   albuterol (PROAIR HFA) 108 (90 Base) MCG/ACT inhaler INHALE 2 PUFFS BY MOUTH EVERY 6 HOURS AS NEEDED FOR WHEEZING 54 g 3   amLODipine (NORVASC) 10 MG tablet Take 1 tablet (10 mg total) by mouth daily. 90 tablet 3   aspirin EC 81 MG tablet Take 1 tablet (81 mg total) by mouth daily. 30 tablet    Blood Glucose Monitoring Suppl (ONETOUCH VERIO FLEX SYSTEM) w/Device KIT Check 4 times a day 1 kit 1   buPROPion (WELLBUTRIN XL) 300 MG 24 hr tablet Take 1 tablet (300 mg total) by mouth  daily. 90 tablet 3   Cholecalciferol (VITAMIN D) 50 MCG (2000 UT) CAPS Take 1 capsule (2,000 Units total) by mouth daily.     doxycycline (VIBRA-TABS) 100 MG tablet Take 1 tablet (100 mg total) by mouth 2 (two) times daily. 28 tablet 0   gentamicin cream (GARAMYCIN) 0.1 % Apply 1 application topically 2 (two) times daily. 30 g 1   glucose blood (ONETOUCH VERIO) test strip 1 each by Other route as needed for other. Use as instructed. Tests 3-4 times daily. 100 each 3   hydrochlorothiazide (HYDRODIURIL) 25 MG tablet Take 25 mg by mouth daily.     insulin degludec (TRESIBA FLEXTOUCH) 200 UNIT/ML FlexTouch Pen Inject 40 Units into the skin daily before breakfast. 27 mL 8   Insulin Pen Needle 32G X 4 MM MISC Use to inject insulin 4 times a day and semaglutide once weekly 100 each 3   Lancets (ONETOUCH ULTRASOFT) lancets Use as instructed 100 each 12   metFORMIN (GLUCOPHAGE) 500 MG tablet Take 1 tablet (500 mg total) by mouth 2 (two) times daily. 90 tablet 3   metoprolol succinate (TOPROL-XL) 100 MG 24 hr tablet TAKE 1 TABLET BY MOUTH DAILY. TAKE WITH OR IMMEDIATELY FOLLOWING A MEAL. 90 tablet 3   nystatin (MYCOSTATIN) 100000 UNIT/ML suspension Take 5 mLs (500,000 Units total) by mouth 4 (four) times daily for 7 days. 60 mL 0   nystatin powder Apply 1 application topically 3 (three) times daily. 30 g 2   omeprazole (PRILOSEC) 20 MG capsule Take 2 capsules (40 mg total) by mouth daily. 180 capsule 3   oxyCODONE-acetaminophen (PERCOCET/ROXICET) 5-325 MG tablet Take 1 tablet by mouth every 8 (eight) hours as needed for severe pain or moderate pain. May take an additional 1 tablet each day for severe pain up to 10 days out of the month. 100 tablet 0   OZEMPIC, 0.25 OR 0.5 MG/DOSE, 2 MG/1.5ML SOPN Inject 0.5 mg into the skin every Monday for 24 doses. 1.5 mL 5   pregabalin (LYRICA) 200 MG capsule TAKE 1 CAPSULE BY MOUTH THREE TIMES A DAY 90 capsule 2   promethazine (PHENERGAN) 25 MG tablet Take 1 tablet (25 mg  total) by mouth every 6 (six) hours as needed for nausea or vomiting. 30 tablet 0   rosuvastatin (CRESTOR) 20 MG tablet Take 1 tablet (20 mg total) by mouth in the morning. 90 tablet 3   topiramate (TOPAMAX) 50 MG tablet TAKE 1 TABLET BY MOUTH TWICE A DAY 180 tablet 1   traZODone (DESYREL) 100 MG tablet Take 200 mg by mouth at bedtime as needed for sleep.     umeclidinium-vilanterol (ANORO ELLIPTA) 62.5-25 MCG/INH AEPB Inhale 1 puff into the lungs in the morning. 1 each 6   [DISCONTINUED] Potassium Chloride ER 20 MEQ TBCR Take 40 mEq by mouth daily.  30 tablet 0   No current facility-administered medications on file prior to visit.    Family History  Problem Relation Age of Onset   Diverticulosis Mother    Diabetes Mother    Hypertension Mother    Congestive Heart Failure Mother    Asthma Father    CAD Sister 39       MI at age 2 per patient.  However, she has not had a stent or CABG.    Heart disease Sister        before age 71   Breast cancer Neg Hx    Review of Systems: ROS negative except for what is noted on the assessment and plan.  Vitals:   10/10/21 0935  BP: (!) 148/69  Pulse: 84  Temp: 97.6 F (36.4 C)  TempSrc: Oral  SpO2: 99%  Weight: 242 lb 11.2 oz (110.1 kg)  Height: 5' 5" (1.651 m)     Physical Exam: General: Obese african [place] female, NAD HENT: normocephalic, atraumatic, normal appearing tongue mucosa, MMM EYES: conjunctiva non-erythematous, no scleral icterus, disconjugate gaze CV: regular rate, normal rhythm, no murmurs, rubs, gallops. 1+ pitting LLE R leg Pulmonary: normal work of breathing on RA, lungs clear to auscultation, no rales, wheezes, rhonchi Abdominal: non-distended, soft, non-tender to palpation, normal BS Skin: Warm and dry, no rashes or lesions MSK: L BTK amputation with prosthetic R Shoulder Exam: No joint effusion. Pain limits shoulder abduction. Pain limits shoulder extension and internal rotation. Passive ROM intact without  pain. Pain elicited with external and internal rotation against resistance, strength intact. Positive empty can test. Tenderness to palpation of anterior joint line. Neurological: MS: awake, alert and oriented x3, normal speech and fund of knowledge Motor: moves all extremities antigravity Psych: normal affect    Assessment & Plan:   See Encounters Tab for problem based charting.  Patient seen with Dr. Lonzo Cloud, M.D. [facility], PGY-1 Pager: [phone] Date 10/10/2021 Time 10:47 AM

## 2021-10-10 NOTE — Patient Instructions (Addendum)
Thank you, Jennifer [name] for allowing [place] to provide your care today. Today we discussed:  Right shoulder pain: We think you have a rotator cuff impingement and this is causing irritation to your right shoulder.  We have ordered Voltaren gel, it is a topical medication you can put on your shoulder a few times a day for pain.  You may continue taking your other pain medications as prescribed.  We hope that you start to feel better soon.  Weight management: Please restart taking your Ozempic every Monday.   My Chart Access: [email or web address]  Please follow-up with Dr. Jimmye Norman your primary care physician on January 5th.  Please make sure to arrive 15 minutes prior to your next appointment. If you arrive late, you may be asked to reschedule.    We look forward to seeing you next time. Please call our clinic at [phone] if you have any questions or concerns. The best time to call is Monday-Friday from 9am-4pm, but there is someone available 24/7. If after hours or the weekend, call the main hospital number and ask for the Internal Medicine Resident On-Call. If you need medication refills, please notify your pharmacy one week in advance and they will send [place] a request.   Thank you for letting us take part in your care. Wishing you the best!  Wayland Denis, MD 10/10/2021, 10:47 AM IM Resident, PGY-1

## 2021-10-10 NOTE — Assessment & Plan Note (Signed)
Patient presents for follow up for presumed thrush 2/2 albuterol and recent abx use. Patient was prescribed nystatin solution at prior office visit 10/13 for burning sensation in her mouth and loss of taste.  Today, patient reports complete resolution of symptoms. Tongue mucosa appears normal and moist on exam.

## 2021-10-11 NOTE — Progress Notes (Signed)
Internal Medicine Clinic Attending ° °I saw and evaluated the patient.  I personally confirmed the key portions of the history and exam documented by Dr. Zinoviev and I reviewed pertinent patient test results.  The assessment, diagnosis, and plan were formulated together and I agree with the documentation in the resident’s note.  °

## 2021-10-16 ENCOUNTER — Other Ambulatory Visit: Payer: Self-pay

## 2021-10-16 ENCOUNTER — Ambulatory Visit (INDEPENDENT_AMBULATORY_CARE_PROVIDER_SITE_OTHER): Payer: Medicare Other | Admitting: Podiatry

## 2021-10-16 DIAGNOSIS — Z9889 Other specified postprocedural states: Secondary | ICD-10-CM

## 2021-10-16 NOTE — Progress Notes (Signed)
Subjective:  Patient PMHx DM type II, LLE BKA, partial fifth ray amputation RT, presents today status post RT fourth toe amputation. DOS: 09/26/2021.  Patient states that she is doing very well.  She is kept the dressings clean dry and intact.  No new complaints at this time  Past Medical History:  Diagnosis Date   Acute vestibular syndrome, resolved 03/02/2021   Angioedema 06/14/2021   Asthma    Burn of finger of right hand, second degree 02/05/2021   Occurred during cooking (frying), poor sensation due to neuropathy, pt punctured blister to allow it to drain, skin has since desquamated over dorsal joint, no infection.  Keep clean and dry, OTC antibacterial ointment.   Cataract    CHF (congestive heart failure) (HCC)    Chronic bronchitis ([place])    "I get it alot" (09/28/2013)   Chronic diastolic heart failure (HCC)    grade 2 per 2D echocardiogram (01/2013)   Chronic lower back pain    Chronic pain syndrome 12/03/2011   Likely secondary to depression, "fibromyalgia", neuropathy, and obesity. Lumbar MRI 2014 no sig change from prior (2008) : Stable hypertrophic facet disease most notable at L4-5. Stable shallow left foraminal/extraforaminal disc protrusion at L4-5. No direct neural compression.       COPD 01/08/2007   PFT's 05/2007 : FEV1/FVC 82, FEV1 64% pred, FEF 25-75% 40% predicted, 16% improvement in FEV1 with bronchodilators.      Depression    Diabetic peripheral neuropathy (HCC)    Dizziness, resolved (admitted with vestibular migraine)    DVT of upper extremity (deep vein thrombosis) ([place]) 03/11/2013   Secondary to PICC line. Right brachial vein, diagnosed on 03/10/2013 Coumadin for 3 months. End date 06/10/2013    Environmental allergies    Hx: of   Fatty liver 2003   observed on ultrasound abdomen   Fibromyalgia    GERD (gastroesophageal reflux disease)    Glaucoma    History of bacterial endocarditis 2014   Endocarditis involving mitral and tricuspid valves.  S. Aureus  and GBS.    History of use of hearing aid    Hyperlipidemia    Hyperplastic colon polyp 12/2010   Per colonoscopy (12/2010) - Dr. 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  Evaluated on 07/08/2021 by podiatrist Dr. Amalia Hailey who debrided necrotic tissue and prescribed doxycycline.  He will see her again in 3 weeks.  The location of this ulcer on the dorsal aspect of the toe is somewhat atypical for a purely diabetic foot wound, and she does have a strong DP pulse.  I did not examine h   Type II diabetes mellitus with peripheral circulatory disorders, uncontrolled DX: 1993   Insulin dep. Poor control. Complicated by diabetic foot ulcer and diabetic eye disease.        Objective/Physical Exam Neurovascular  status intact.  Skin incisions appear to be well coapted with sutures intact. No sign of infectious process noted. No dehiscence. No active bleeding noted. History of BKA LLE  Assessment: 1. s/p RT fourth toe amputation. DOS: 09/26/2021 2.  History of BKA LLE   Plan of Care:  1. Patient was evaluated. 2.  Sutures removed.   3.  There was a hyperkeratotic portion/preulcerative callus to the medial aspect of the first MTP joint.  Today a 312 scalpel was utilized to resect away and debride the callus lesion.  The patient felt significant relief 4.  The amputation site has  healed.  Patient may now resume regular activity 5.  Return to clinic in 3 months for routine foot care with Dr. April Holding, DPM [facility]  Dr. Edrick Kins, DPM    [address], [place] [address]                Office [phone]  Fax [phone]

## 2021-10-17 ENCOUNTER — Encounter: Payer: Self-pay | Admitting: Dietician

## 2021-10-17 ENCOUNTER — Ambulatory Visit (INDEPENDENT_AMBULATORY_CARE_PROVIDER_SITE_OTHER): Payer: Medicare Other | Admitting: Dietician

## 2021-10-17 VITALS — Wt 228.5 lb

## 2021-10-17 DIAGNOSIS — E118 Type 2 diabetes mellitus with unspecified complications: Secondary | ICD-10-CM | POA: Diagnosis not present

## 2021-10-17 DIAGNOSIS — N3946 Mixed incontinence: Secondary | ICD-10-CM

## 2021-10-17 DIAGNOSIS — Z794 Long term (current) use of insulin: Secondary | ICD-10-CM | POA: Diagnosis not present

## 2021-10-17 LAB — GLUCOSE, CAPILLARY: Glucose-Capillary: 74 mg/dL (ref 70–99)

## 2021-10-17 NOTE — Patient Instructions (Addendum)
Good job on your blood sugars!  Please decrease Tresiba to 40 units daily and continue metformin as ordered.   If you place a new sensor making sure to load it on a hard surface.  Please call a pharmacy to get your Covid booster.   [name] [phone]

## 2021-10-17 NOTE — Progress Notes (Signed)
Wt Readings from Last 10 Encounters:  10/17/21 228 lb 8 oz (103.6 kg)  10/10/21 242 lb 11.2 oz (110.1 kg)  10/03/21 232 lb 3.2 oz (105.3 kg)  09/19/21 237 lb 14.4 oz (107.9 kg)  08/16/21 248 lb (112.5 kg)  07/11/21 248 lb (112.5 kg)  06/15/21 240 lb 15.4 oz (109.3 kg)  04/26/21 257 lb 9.6 oz (116.8 kg)  04/04/21 253 lb 1.6 oz (114.8 kg)  03/02/21 260 lb (117.9 kg)   Diabetes Self-Management Education  Visit Type: Follow-up  Appt. Start Time: 1325 Appt. End Time: 1355  10/17/2021  Jennifer [name], identified by name and date of birth, is a 60 y.o. female with a diagnosis of Diabetes:  Marland Kitchen Type 2  ASSESSMENT Wants a referral to PREP program CBGs doing good overall, no lows reported. CGM printout shows lows overnight:  CGM Results from download:   % Time CGM active:   68 %   (Goal >70%)  Average glucose:   122 mg/dL for 14 days  Glucose management indicator:   6.2 %  Time in range (70-180 mg/dL):   92 %   (Goal >70%)  Time High (181-250 mg/dL):   7 %   (Goal < 25%)  Time Very High (>250 mg/dL):    0 %   (Goal < 5%)  Time Low (54-69 mg/dL):   1 %   (Goal <4%)  Time Very Low (<54 mg/dL):   0 %   (Goal <1%)  Coefficient of variation:   28.6 %   (Goal <36%)    Weight 228 lb 8 oz (103.6 kg). Body mass index is 38.02 kg/m. Wt Readings from Last 10 Encounters:  10/17/21 228 lb 8 oz (103.6 kg)  10/10/21 242 lb 11.2 oz (110.1 kg)  10/03/21 232 lb 3.2 oz (105.3 kg)  09/19/21 237 lb 14.4 oz (107.9 kg)  08/16/21 248 lb (112.5 kg)  07/11/21 248 lb (112.5 kg)  06/15/21 240 lb 15.4 oz (109.3 kg)  04/26/21 257 lb 9.6 oz (116.8 kg)  04/04/21 253 lb 1.6 oz (114.8 kg)  03/02/21 260 lb (117.9 kg)   Weight loss is ongoing. Jennifer [name] says she eats well and has plenty of food. She says it is the Ozempic.    Diabetes Self-Management Education - 10/17/21 1500       Visit Information   Visit Type Follow-up      Health Coping   How would you rate your overall health? Good   stiches  out on foot and it is healing well.     Individualized Goals (developed by patient)   Medications take my medication as prescribed      Patient Self-Evaluation of Goals - Patient rates self as meeting previously set goals (% of time)   Monitoring >75%      Outcomes   Expected Outcomes Demonstrated interest in learning. Expect positive outcomes    Future DMSE 2 wks;4-6 wks   per patient request   Program Status Completed      Subsequent Visit   Since your last visit have you continued or begun to take your medications as prescribed? No   has been taking 70 units of Tresiba and skipping her metformin to keep from having hypoglycemia   Since your last visit have you had your blood pressure checked? No    Weight Loss (lbs) 4   since 10-03-21, feel 10-20 was erroneous   Since your last visit, are you checking your blood glucose at least once a day?  Yes   with CGM doing better            Individualized Plan for Diabetes Self-Management Training:   Learning Objective:  Patient will have a greater understanding of diabetes self-management. Patient education plan is to attend individual and/or group sessions per assessed needs and concerns.   Plan:   Patient Instructions  Good job on your blood sugars!  Please decrease Tresiba to 40 units daily and continue metformin as ordered.   If you place a new sensor making sure to load it on a hard surface.  Please call a pharmacy to get your Covid booster.   Jennifer [name] [phone]       Expected Outcomes:  Demonstrated interest in learning. Expect positive outcomes  Education material provided: Diabetes Resources  If problems or questions, patient to contact team via:  Phone  Future DSME appointment: 2 wks, 4-6 wks (per patient request) [name], RD 10/17/2021 3:44 PM.

## 2021-10-22 ENCOUNTER — Telehealth: Payer: Self-pay | Admitting: Dietician

## 2021-10-22 NOTE — Telephone Encounter (Signed)
Jennifer [name] called saying her Blood sugar is 279 she took 70 units Tresiba 2-3 hours ago, she wants to know what she can do to get it down. ( Says she has a bunch of Humalog) Knows that what she ate (two gigantic marshmallows.) increased it. Had her recheck and it while on the phone;  read 294  with a flat trend arrow. I suggested she do nothing other than drink water, decrease starchy and sugary foods and watch for low blood sugars later today and tomorrow from taking extra [place]. Encouraged her to only take 40 units of Tresiba as prescribed.  Could consider allowing Jennifer [name] to take correction Humalog every 5 hours for blood sugar 250 mg/dl as needed.

## 2021-10-23 ENCOUNTER — Encounter: Payer: Medicare Other | Admitting: Podiatry

## 2021-10-24 ENCOUNTER — Emergency Department (HOSPITAL_COMMUNITY): Payer: Medicare Other

## 2021-10-24 ENCOUNTER — Other Ambulatory Visit: Payer: Self-pay

## 2021-10-24 ENCOUNTER — Telehealth: Payer: Self-pay | Admitting: *Deleted

## 2021-10-24 ENCOUNTER — Institutional Professional Consult (permissible substitution): Payer: Medicare Other | Admitting: Behavioral Health

## 2021-10-24 ENCOUNTER — Telehealth: Payer: Self-pay | Admitting: Behavioral Health

## 2021-10-24 ENCOUNTER — Emergency Department (HOSPITAL_BASED_OUTPATIENT_CLINIC_OR_DEPARTMENT_OTHER)
Admit: 2021-10-24 | Discharge: 2021-10-24 | Disposition: A | Discharge status: Home / Self Care | Payer: Medicare Other | Attending: Emergency Medicine | Admitting: Emergency Medicine

## 2021-10-24 ENCOUNTER — Encounter (HOSPITAL_COMMUNITY): Payer: Self-pay | Admitting: *Deleted

## 2021-10-24 ENCOUNTER — Inpatient Hospital Stay (HOSPITAL_COMMUNITY)
Admission: EM | Admit: 2021-10-24 | Discharge: 2021-10-27 | DRG: 291 | Disposition: A | Discharge status: Home / Self Care | Payer: Medicare Other | Attending: Internal Medicine | Admitting: Internal Medicine

## 2021-10-24 DIAGNOSIS — I509 Heart failure, unspecified: Secondary | ICD-10-CM

## 2021-10-24 DIAGNOSIS — M797 Fibromyalgia: Secondary | ICD-10-CM | POA: Diagnosis present

## 2021-10-24 DIAGNOSIS — Z7984 Long term (current) use of oral hypoglycemic drugs: Secondary | ICD-10-CM

## 2021-10-24 DIAGNOSIS — Z89512 Acquired absence of left leg below knee: Secondary | ICD-10-CM

## 2021-10-24 DIAGNOSIS — M7989 Other specified soft tissue disorders: Secondary | ICD-10-CM

## 2021-10-24 DIAGNOSIS — Z91018 Allergy to other foods: Secondary | ICD-10-CM

## 2021-10-24 DIAGNOSIS — Z6839 Body mass index (BMI) 39.0-39.9, adult: Secondary | ICD-10-CM

## 2021-10-24 DIAGNOSIS — N182 Chronic kidney disease, stage 2 (mild): Secondary | ICD-10-CM | POA: Diagnosis present

## 2021-10-24 DIAGNOSIS — E669 Obesity, unspecified: Secondary | ICD-10-CM | POA: Diagnosis present

## 2021-10-24 DIAGNOSIS — Z885 Allergy status to narcotic agent status: Secondary | ICD-10-CM

## 2021-10-24 DIAGNOSIS — N3946 Mixed incontinence: Secondary | ICD-10-CM | POA: Diagnosis present

## 2021-10-24 DIAGNOSIS — J449 Chronic obstructive pulmonary disease, unspecified: Secondary | ICD-10-CM | POA: Diagnosis present

## 2021-10-24 DIAGNOSIS — G546 Phantom limb syndrome with pain: Secondary | ICD-10-CM | POA: Diagnosis present

## 2021-10-24 DIAGNOSIS — I130 Hypertensive heart and chronic kidney disease with heart failure and stage 1 through stage 4 chronic kidney disease, or unspecified chronic kidney disease: Secondary | ICD-10-CM | POA: Diagnosis not present

## 2021-10-24 DIAGNOSIS — Z825 Family history of asthma and other chronic lower respiratory diseases: Secondary | ICD-10-CM

## 2021-10-24 DIAGNOSIS — R609 Edema, unspecified: Secondary | ICD-10-CM

## 2021-10-24 DIAGNOSIS — I739 Peripheral vascular disease, unspecified: Secondary | ICD-10-CM | POA: Diagnosis present

## 2021-10-24 DIAGNOSIS — I5033 Acute on chronic diastolic (congestive) heart failure: Secondary | ICD-10-CM

## 2021-10-24 DIAGNOSIS — K219 Gastro-esophageal reflux disease without esophagitis: Secondary | ICD-10-CM | POA: Diagnosis present

## 2021-10-24 DIAGNOSIS — E876 Hypokalemia: Secondary | ICD-10-CM | POA: Diagnosis present

## 2021-10-24 DIAGNOSIS — Z91128 Patient's intentional underdosing of medication regimen for other reason: Secondary | ICD-10-CM

## 2021-10-24 DIAGNOSIS — Z79899 Other long term (current) drug therapy: Secondary | ICD-10-CM

## 2021-10-24 DIAGNOSIS — E785 Hyperlipidemia, unspecified: Secondary | ICD-10-CM | POA: Diagnosis present

## 2021-10-24 DIAGNOSIS — T501X6A Underdosing of loop [high-ceiling] diuretics, initial encounter: Secondary | ICD-10-CM | POA: Diagnosis present

## 2021-10-24 DIAGNOSIS — G43109 Migraine with aura, not intractable, without status migrainosus: Secondary | ICD-10-CM | POA: Diagnosis present

## 2021-10-24 DIAGNOSIS — I5032 Chronic diastolic (congestive) heart failure: Secondary | ICD-10-CM | POA: Diagnosis present

## 2021-10-24 DIAGNOSIS — I872 Venous insufficiency (chronic) (peripheral): Secondary | ICD-10-CM | POA: Diagnosis present

## 2021-10-24 DIAGNOSIS — E1122 Type 2 diabetes mellitus with diabetic chronic kidney disease: Secondary | ICD-10-CM | POA: Diagnosis present

## 2021-10-24 DIAGNOSIS — Z86718 Personal history of other venous thrombosis and embolism: Secondary | ICD-10-CM

## 2021-10-24 DIAGNOSIS — Z9889 Other specified postprocedural states: Secondary | ICD-10-CM | POA: Diagnosis present

## 2021-10-24 DIAGNOSIS — Z7982 Long term (current) use of aspirin: Secondary | ICD-10-CM

## 2021-10-24 DIAGNOSIS — F17210 Nicotine dependence, cigarettes, uncomplicated: Secondary | ICD-10-CM | POA: Diagnosis present

## 2021-10-24 DIAGNOSIS — G4700 Insomnia, unspecified: Secondary | ICD-10-CM | POA: Diagnosis present

## 2021-10-24 DIAGNOSIS — Z91041 Radiographic dye allergy status: Secondary | ICD-10-CM

## 2021-10-24 DIAGNOSIS — E1142 Type 2 diabetes mellitus with diabetic polyneuropathy: Secondary | ICD-10-CM | POA: Diagnosis present

## 2021-10-24 DIAGNOSIS — Z833 Family history of diabetes mellitus: Secondary | ICD-10-CM

## 2021-10-24 DIAGNOSIS — Z8249 Family history of ischemic heart disease and other diseases of the circulatory system: Secondary | ICD-10-CM

## 2021-10-24 LAB — BASIC METABOLIC PANEL
Anion gap: 9 (ref 5–15)
BUN: 15 mg/dL (ref 8–23)
CO2: 26 mmol/L (ref 22–32)
Calcium: 8.6 mg/dL — ABNORMAL LOW (ref 8.9–10.3)
Chloride: 108 mmol/L (ref 98–111)
Creatinine, Ser: 1.14 mg/dL — ABNORMAL HIGH (ref 0.44–1.00)
GFR, Estimated: 54 mL/min — ABNORMAL LOW (ref >60)
Glucose, Bld: 116 mg/dL — ABNORMAL HIGH (ref 70–99)
Potassium: 3.2 mmol/L — ABNORMAL LOW (ref 3.5–5.1)
Sodium: 143 mmol/L (ref 135–145)

## 2021-10-24 LAB — CBC
HCT: 36.3 % (ref 36.0–46.0)
Hemoglobin: 11.8 g/dL — ABNORMAL LOW (ref 12.0–15.0)
MCH: 31.2 pg (ref 26.0–34.0)
MCHC: 32.5 g/dL (ref 30.0–36.0)
MCV: 96 fL (ref 80.0–100.0)
Platelets: 183 10*3/uL (ref 150–400)
RBC: 3.78 MIL/uL — ABNORMAL LOW (ref 3.87–5.11)
RDW: 15.3 % (ref 11.5–15.5)
WBC: 9 10*3/uL (ref 4.0–10.5)
nRBC: 0 % (ref 0.0–0.2)

## 2021-10-24 LAB — BRAIN NATRIURETIC PEPTIDE: B Natriuretic Peptide: 148.2 pg/mL — ABNORMAL HIGH (ref 0.0–100.0)

## 2021-10-24 MED ORDER — AMLODIPINE BESYLATE 10 MG PO TABS
10.0000 mg | ORAL_TABLET | Freq: Every day | ORAL | Status: DC
  Administered 2021-10-25 – 2021-10-27 (×3): 10 mg via ORAL
  Filled 2021-10-24 (×3): qty 1

## 2021-10-24 MED ORDER — OXYCODONE-ACETAMINOPHEN 5-325 MG PO TABS
1.0000 | ORAL_TABLET | Freq: Three times a day (TID) | ORAL | Status: DC | PRN
  Administered 2021-10-25 – 2021-10-27 (×7): 1 via ORAL
  Filled 2021-10-24 (×7): qty 1

## 2021-10-24 MED ORDER — TOPIRAMATE 25 MG PO TABS
50.0000 mg | ORAL_TABLET | Freq: Two times a day (BID) | ORAL | Status: DC
  Administered 2021-10-25 – 2021-10-27 (×6): 50 mg via ORAL
  Filled 2021-10-24 (×6): qty 2

## 2021-10-24 MED ORDER — ENOXAPARIN SODIUM 40 MG/0.4ML IJ SOSY
40.0000 mg | PREFILLED_SYRINGE | INTRAMUSCULAR | Status: DC
  Administered 2021-10-25: 40 mg via SUBCUTANEOUS
  Filled 2021-10-24: qty 0.4

## 2021-10-24 MED ORDER — FUROSEMIDE 10 MG/ML IJ SOLN
40.0000 mg | Freq: Once | INTRAMUSCULAR | Status: AC
  Administered 2021-10-25: 40 mg via INTRAVENOUS
  Filled 2021-10-24: qty 4

## 2021-10-24 MED ORDER — ALBUTEROL SULFATE (2.5 MG/3ML) 0.083% IN NEBU: 3.0000 mL | INHALATION_SOLUTION | Freq: Four times a day (QID) | RESPIRATORY_TRACT | Status: DC | PRN

## 2021-10-24 MED ORDER — METFORMIN HCL 500 MG PO TABS
500.0000 mg | ORAL_TABLET | Freq: Two times a day (BID) | ORAL | Status: DC
  Administered 2021-10-25 – 2021-10-27 (×6): 500 mg via ORAL
  Filled 2021-10-24 (×6): qty 1

## 2021-10-24 MED ORDER — PANTOPRAZOLE SODIUM 40 MG PO TBEC
40.0000 mg | DELAYED_RELEASE_TABLET | Freq: Every day | ORAL | Status: DC
  Administered 2021-10-25 – 2021-10-27 (×3): 40 mg via ORAL
  Filled 2021-10-24 (×3): qty 1

## 2021-10-24 MED ORDER — INSULIN DETEMIR 100 UNIT/ML ~~LOC~~ SOLN
40.0000 [IU] | Freq: Every day | SUBCUTANEOUS | Status: DC
Start: 2021-10-25 — End: 2021-10-27
  Administered 2021-10-25 – 2021-10-27 (×3): 40 [IU] via SUBCUTANEOUS
  Filled 2021-10-24 (×3): qty 0.4

## 2021-10-24 MED ORDER — ACETAMINOPHEN 650 MG RE SUPP: 650.0000 mg | Freq: Four times a day (QID) | RECTAL | Status: DC | PRN

## 2021-10-24 MED ORDER — PREGABALIN 100 MG PO CAPS
200.0000 mg | ORAL_CAPSULE | Freq: Three times a day (TID) | ORAL | Status: DC
  Administered 2021-10-25 – 2021-10-27 (×8): 200 mg via ORAL
  Filled 2021-10-24 (×8): qty 2

## 2021-10-24 MED ORDER — VITAMIN D 25 MCG (1000 UNIT) PO TABS
2000.0000 [IU] | ORAL_TABLET | Freq: Every day | ORAL | Status: DC
Start: 2021-10-24 — End: 2021-10-27
  Administered 2021-10-25 – 2021-10-27 (×3): 2000 [IU] via ORAL
  Filled 2021-10-24 (×3): qty 2

## 2021-10-24 MED ORDER — ROSUVASTATIN CALCIUM 20 MG PO TABS
20.0000 mg | ORAL_TABLET | Freq: Every morning | ORAL | Status: DC
  Administered 2021-10-25 – 2021-10-27 (×3): 20 mg via ORAL
  Filled 2021-10-24 (×3): qty 1

## 2021-10-24 MED ORDER — DICLOFENAC SODIUM 1 % EX GEL: 2.0000 g | Freq: Four times a day (QID) | CUTANEOUS | Status: DC | PRN

## 2021-10-24 MED ORDER — POTASSIUM CHLORIDE CRYS ER 20 MEQ PO TBCR
40.0000 meq | EXTENDED_RELEASE_TABLET | Freq: Two times a day (BID) | ORAL | Status: AC
  Administered 2021-10-24 – 2021-10-26 (×4): 40 meq via ORAL
  Filled 2021-10-24 (×4): qty 2

## 2021-10-24 MED ORDER — HYDROCHLOROTHIAZIDE 25 MG PO TABS
25.0000 mg | ORAL_TABLET | Freq: Every day | ORAL | Status: DC
  Administered 2021-10-25 – 2021-10-27 (×3): 25 mg via ORAL
  Filled 2021-10-24 (×3): qty 1

## 2021-10-24 MED ORDER — BUPROPION HCL ER (XL) 150 MG PO TB24
300.0000 mg | ORAL_TABLET | Freq: Every day | ORAL | Status: DC
  Administered 2021-10-25 – 2021-10-27 (×3): 300 mg via ORAL
  Filled 2021-10-24 (×3): qty 2

## 2021-10-24 MED ORDER — METOPROLOL SUCCINATE ER 100 MG PO TB24
100.0000 mg | ORAL_TABLET | Freq: Every day | ORAL | Status: DC
  Administered 2021-10-25 – 2021-10-27 (×3): 100 mg via ORAL
  Filled 2021-10-24 (×3): qty 1

## 2021-10-24 MED ORDER — UMECLIDINIUM-VILANTEROL 62.5-25 MCG/ACT IN AEPB
1.0000 | INHALATION_SPRAY | Freq: Every morning | RESPIRATORY_TRACT | Status: DC
  Administered 2021-10-25 – 2021-10-26 (×2): 1 via RESPIRATORY_TRACT
  Filled 2021-10-24: qty 14

## 2021-10-24 MED ORDER — ASPIRIN EC 81 MG PO TBEC
81.0000 mg | DELAYED_RELEASE_TABLET | Freq: Every day | ORAL | Status: DC
  Administered 2021-10-25 – 2021-10-27 (×3): 81 mg via ORAL
  Filled 2021-10-24 (×3): qty 1

## 2021-10-24 MED ORDER — TRAZODONE HCL 100 MG PO TABS
200.0000 mg | ORAL_TABLET | Freq: Every evening | ORAL | Status: DC | PRN
  Administered 2021-10-25: 200 mg via ORAL
  Filled 2021-10-24: qty 2

## 2021-10-24 MED ORDER — PROMETHAZINE HCL 25 MG PO TABS
25.0000 mg | ORAL_TABLET | Freq: Four times a day (QID) | ORAL | Status: DC | PRN
  Filled 2021-10-24: qty 1

## 2021-10-24 MED ORDER — ACETAMINOPHEN 325 MG PO TABS
650.0000 mg | ORAL_TABLET | Freq: Four times a day (QID) | ORAL | Status: DC | PRN
  Administered 2021-10-25 – 2021-10-26 (×2): 650 mg via ORAL
  Filled 2021-10-24 (×2): qty 2

## 2021-10-24 NOTE — ED Provider Notes (Signed)
Emergency Medicine Provider Triage Evaluation Note  Jennifer [name] , a 60 y.o. female  was evaluated in triage.  Pt complains of right leg swelling.  Patient states that she chronically has right leg swelling, but this has been more severe in the past 2 weeks.  Yesterday she began having drainage from her right lower leg.  She also has chronic shortness of breath due to asthma and COPD, states this is not different than how it normally is.  S/p left below the knee amputation with fibromyalgia and neuropathy, no change in sensation  Review of Systems  Positive: Leg pain and swelling Negative: Fevers, chills, chest pain, shortness of breath  Physical Exam  BP 119/80 (BP Location: Right Arm)   Pulse 83   Temp 98 F (36.7 C) (Oral)   Resp 14   SpO2 100%  Gen:   Awake, no distress   Resp:  Normal effort  MSK:   Moves extremities without difficulty  Other:  Nonpitting edema to the level of the thigh, chronic appearing wounds diffusely over the right calf with serosanguineous drainage  Medical Decision Making  Medically screening exam initiated at 3:07 PM.  Appropriate orders placed.  Jennifer [name] was informed that the remainder of the evaluation will be completed by another provider, this initial triage assessment does not replace that evaluation, and the importance of remaining in the ED until their evaluation is complete.     Estill Cotta 10/24/21 1509    Carmin Muskrat, MD 10/24/21 [phone]

## 2021-10-24 NOTE — H&P (Addendum)
Date: 10/24/2021               Patient Name:  Jennifer [name] MRN: [id number]  DOB: 12-03-1961 Age / Sex: 60 y.o., female   PCP: Angelica Pou, MD         Medical Service: Internal Medicine Teaching Service         Attending Physician: Dr. Velna Ochs, MD    First Contact: Dr. France Ravens Pager: [phone]  Second Contact: Dr. Gaylan Gerold Pager: [phone]       After Hours (After 5p/  First Contact Pager: [phone]  weekends / holidays): Second Contact Pager: [phone]   Chief Complaint: Leg swelling  History of Present Illness:   Jennifer [name] is a 60 year old female with past medical history of HFpEF, COPD, poorly controlled type 2 diabetes with peripheral neuropathy, hypertension, hyperlipidemia, depression GERD, PVD, left BKA, urge urinary incontinence, who presents to [facility] ED for right lower extremity swelling.  Patient called [facility] earlier this morning, stating R knee popped and was bleeding. No openings available with [facility] today.  Patient was advised to call wound care specialist.  Ultimately she reported to the ED for her symptoms.  Patient reports 2-3 weeks of worsening right lower extremity swelling.  Earlier today she noted small drops of blood on her sock and identified small cut on her shin causing bleeding.  She does not recall hitting her leg.  She endorses heavy feeling in her right limb that is making it difficult for her to move around.  She wears compression stockings at home the leg is swollen to the point where she can no longer wear them. She has previously taken Lasix for acute heart failure exacerbation.  She notes that this medication makes her urinate even more frequently.  At home she is on hydrochlorothiazide and has history of urge urinary incontinence.  She states at night she uses 2 depends diapers for incontinence.  Currently she denies PND, orthopnea, shortness of breath, chest pain, dysuria.  She is having good urine output at home on  hydrochlorothiazide.  We discussed starting Lasix pills for her right leg swelling, however patient has concerns that it would be difficult for her to manage her incontinence at home.  She does have family and friends that she lives with who help her throughout the day, though she would prefer to stay in the hospital while she is on Lasix.   Meds:  Current Outpatient Medications  Medication Instructions   albuterol (PROAIR HFA) 108 (90 Base) MCG/ACT inhaler INHALE 2 PUFFS BY MOUTH EVERY 6 HOURS AS NEEDED FOR WHEEZING   amLODipine (NORVASC) 10 mg, Oral, Daily   aspirin EC 81 mg, Oral, Daily   Blood Glucose Monitoring Suppl (ONETOUCH VERIO FLEX SYSTEM) w/Device KIT Check 4 times a day   buPROPion (WELLBUTRIN XL) 300 mg, Oral, Daily   diclofenac Sodium (VOLTAREN) 2 g, Topical, 4 times daily, For the right shoulder pain.   doxycycline (VIBRA-TABS) 100 mg, Oral, 2 times daily   gentamicin cream (GARAMYCIN) 0.1 % 1 application, Topical, 2 times daily   glucose blood (ONETOUCH VERIO) test strip 1 each, Other, As needed, Use as instructed. Tests 3-4 times daily.   hydrochlorothiazide (HYDRODIURIL) 25 mg, Oral, Daily   Insulin Pen Needle 32G X 4 MM MISC Use to inject insulin 4 times a day and semaglutide once weekly   Lancets (ONETOUCH ULTRASOFT) lancets Use as instructed   metFORMIN (GLUCOPHAGE) 500 mg, Oral, 2 times daily  metoprolol succinate (TOPROL-XL) 100 MG 24 hr tablet TAKE 1 TABLET BY MOUTH DAILY. TAKE WITH OR IMMEDIATELY FOLLOWING A MEAL.   nystatin powder 1 application, Topical, 3 times daily   omeprazole (PRILOSEC) 40 mg, Oral, Daily   oxyCODONE-acetaminophen (PERCOCET/ROXICET) 5-325 MG tablet 1 tablet, Oral, Every 8 hours PRN, May take an additional 1 tablet each day for severe pain up to 10 days out of the month.   Ozempic (0.25 or 0.5 MG/DOSE) 0.5 mg, Subcutaneous, Every Mon   pregabalin (LYRICA) 200 MG capsule TAKE 1 CAPSULE BY MOUTH THREE TIMES A DAY   promethazine (PHENERGAN) 25  mg, Oral, Every 6 hours PRN   rosuvastatin (CRESTOR) 20 mg, Oral, Every morning   topiramate (TOPAMAX) 50 MG tablet TAKE 1 TABLET BY MOUTH TWICE A DAY   traZODone (DESYREL) 200 mg, Oral, At bedtime PRN   Tresiba FlexTouch 40 Units, Subcutaneous, Daily before breakfast   umeclidinium-vilanterol (ANORO ELLIPTA) 62.5-25 MCG/INH AEPB 1 puff, Inhalation, Every morning   Vitamin D 2,000 Units, Oral, Daily      Allergies: Allergies as of 10/24/2021 - Review Complete 10/24/2021  Allergen Reaction Noted   Benazepril Swelling 06/14/2021   Pineapple Swelling 08/13/2021   Abilify [aripiprazole] Other (See Comments) 11/09/2015   Iohexol Other (See Comments) 08/17/2007   Ivp dye [iodinated diagnostic agents] Other (See Comments) 05/10/2013   Morphine sulfate Itching and Rash    Past Medical History:  Diagnosis Date   Acute vestibular syndrome, resolved 03/02/2021   Angioedema 06/14/2021   Asthma    Burn of finger of right hand, second degree 02/05/2021   Occurred during cooking (frying), poor sensation due to neuropathy, pt punctured blister to allow it to drain, skin has since desquamated over dorsal joint, no infection.  Keep clean and dry, OTC antibacterial ointment.   Cataract    CHF (congestive heart failure) (HCC)    Chronic bronchitis ([place])    "I get it alot" (09/28/2013)   Chronic diastolic heart failure (HCC)    grade 2 per 2D echocardiogram (01/2013)   Chronic lower back pain    Chronic pain syndrome 12/03/2011   Likely secondary to depression, "fibromyalgia", neuropathy, and obesity. Lumbar MRI 2014 no sig change from prior (2008) : Stable hypertrophic facet disease most notable at L4-5. Stable shallow left foraminal/extraforaminal disc protrusion at L4-5. No direct neural compression.       COPD 01/08/2007   PFT's 05/2007 : FEV1/FVC 82, FEV1 64% pred, FEF 25-75% 40% predicted, 16% improvement in FEV1 with bronchodilators.      Depression    Diabetic peripheral neuropathy (HCC)     Dizziness, resolved (admitted with vestibular migraine)    DVT of upper extremity (deep vein thrombosis) ([place]) 03/11/2013   Secondary to PICC line. Right brachial vein, diagnosed on 03/10/2013 Coumadin for 3 months. 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    History of use of hearing aid    Hyperlipidemia    Hyperplastic colon polyp 12/2010   Per colonoscopy (12/2010) - Dr. 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  Evaluated on 07/08/2021 by podiatrist Dr. Amalia Hailey who debrided necrotic tissue and prescribed doxycycline.  He will see her again in 3 weeks.  The location of this ulcer on the dorsal aspect of the toe is somewhat atypical for a purely diabetic foot wound, and she does have a strong DP pulse.  I did not examine h   Type II diabetes mellitus with peripheral circulatory disorders, uncontrolled DX: 1993   Insulin dep. Poor control. Complicated by diabetic foot ulcer and diabetic eye disease.      Family History:  Family History  Problem Relation Age of Onset   Diverticulosis Mother    Diabetes Mother     Hypertension Mother    Congestive Heart Failure Mother    Asthma Father    CAD Sister 64       MI at age 80 per patient.  However, she has not had a stent or CABG.    Heart disease Sister        before age 39   Breast cancer Neg Hx      Social History: Patient lives in a house with her 2 sons and her friend and friend's husband.  She is currently not working.  She denies alcohol use, tobacco use, illicit drug use.  Review of Systems: A complete ROS was negative except as per HPI.   Physical Exam: Blood pressure 134/76, pulse 68, temperature 97.6 F (36.4 C), temperature source Oral, resp. rate 16, height _0  (1.651 m), weight 103.7 kg, SpO2 96 %. Physical Exam: General: Obese African-American female, NAD HENT: normocephalic, atraumatic EYES: conjunctiva non-erythematous, no scleral icterus CV: regular rate, normal rhythm, no murmurs, rubs, gallops.  3+ pitting edema to the R knee.  Trace dependent pitting edema R upper thigh. Pulmonary: normal work of breathing on RA, lungs clear to auscultation, no rales, wheezes, rhonchi Abdominal: non-distended, soft, non-tender to palpation, normal BS Skin: Warm and dry, mild erythema right lower extremity, with 1 cm laceration weeping clear/red serosanguineous fluid. MSK: Left AKA Neurological: MS: awake, alert and oriented x3, normal speech and fund of knowledge Motor: moves all extremities antigravity Psych: normal affect   EKG: personally reviewed my interpretation is NSR with PAC  CXR: personally reviewed my interpretation is no active pulmonary disease, prominent hilar region  CBC    Component Value Date/Time   WBC 9.0 10/24/2021 1515   RBC 3.78 (L) 10/24/2021 1515   HGB 11.8 (L) 10/24/2021 1515   HGB 12.9 04/04/2021 1215   HCT 36.3 10/24/2021 1515   HCT 40.5 04/04/2021 1215   PLT 183 10/24/2021 1515   PLT 149 (L) 04/04/2021 1215   MCV 96.0 10/24/2021 1515   MCV 96 04/04/2021 1215   MCH 31.2 10/24/2021 1515   MCHC 32.5  10/24/2021 1515   RDW 15.3 10/24/2021 1515   RDW 14.4 04/04/2021 1215   LYMPHSABS 1.9 06/14/2021 1430   LYMPHSABS 4.3 (H) 11/11/2018 1225   MONOABS 0.4 06/14/2021 1430   EOSABS 0.0  06/14/2021 1430   EOSABS 0.1 11/11/2018 1225   BASOSABS 0.0 06/14/2021 1430   BASOSABS 0.1 11/11/2018 1225  CMP     Component Value Date/Time   NA 143 10/24/2021 1515   NA 142 04/26/2021 0946   K 3.2 (L) 10/24/2021 1515   CL 108 10/24/2021 1515   CO2 26 10/24/2021 1515   GLUCOSE 116 (H) 10/24/2021 1515   BUN 15 10/24/2021 1515   BUN 18 04/26/2021 0946   CREATININE 1.14 (H) 10/24/2021 1515   CREATININE 0.68 01/31/2015 1641   CALCIUM 8.6 (L) 10/24/2021 1515   PROT 6.0 (L) 06/15/2021 0118   PROT 6.5 08/24/2019 1524   ALBUMIN 2.8 (L) 06/15/2021 0118   ALBUMIN 3.8 08/24/2019 1524   AST 16 06/15/2021 0118   ALT 19 06/15/2021 0118   ALKPHOS 54 06/15/2021 0118   BILITOT 0.4 06/15/2021 0118   BILITOT <0.2 08/24/2019 1524   GFRNONAA 54 (L) 10/24/2021 1515   GFRNONAA >89 01/31/2015 1641   GFRAA 54 (L) 10/25/2020 1131   GFRAA >89 01/31/2015 1641    Assessment & Plan by Problem: Active Problems:   Leg swelling  SAMIRAH SCARPATI is a 60 year old female with past medical history of HFpEF, COPD, poorly controlled type 2 diabetes with peripheral neuropathy, hypertension, hyperlipidemia, depression GERD, PVD, left BKA, urge urinary incontinence, who presented to [facility] ED for right lower extremity swelling and admitted for observation and dose of IV diuretics.  #Right lower extremity swelling #Chronic venous insufficiency #PAD with hx of revascularization #Chronic HFpEF Patient presents to [facility] ED for 2 to 3 weeks of worsening right lower extremity pitting edema with very small laceration weeping serosanguineous fluid. VAS dupplex negative for DVT. Worsening swelling likely secondary to patient's history of chronic venous insufficiency and PVD with claudication (ABI 08/2021).  Patient also has history of  HFpEF though currently denying orthopnea, PND, SOB, no crackles on exam, CXR without pulm edema.  Discussed with patient the possibility of sending her home with short course of p.o. Lasix this evening, however patient has concerns that she will not be able to manage her urinary incontinence at home while on short course Lasix. Will admit for observation and dose of IV diuresis.  -IV Lasix 3m once -Plan to start PO lasix tomorrow -Unna boots -BNP pending -Continue aspirin 81 mg daily   #1cm laceration R lower extremity with mild erythema Photo included in media tab. Patient has small laceration weeping serosanguineous fluid with mild erythema surrounding region.  Patient has no other systemic signs of infection at this time.  Patient is not having significant pain around the laceration site.  Low concern for infection at this time, though will want to continue to monitor for worsening erythema and other systemic signs of infection.  Could also be chronic skin changes from venous insufficiency. 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  Creatinine stable at 1.14. -Repletion of electrolytes as needed  #HTN Restarted home amlodipine 10 mg daily, hydrochlorothiazide 25 mg daily, metoprolol succinate 100 mg daily.  #T2DM Patient was continued on metformin 500 mg twice daily.  [place], we started Levemir 40 units daily while inpatient.  #HLD Patient continued on home Crestor 20 mg daily  #Peripheral neuropathy Continue Lyrica 200 mg 3 times daily  #COPD Continued home Anoro Ellipta with albuterol as needed  #GERD Continued home Protonix 40 mg daily  #Complex  migraines Continued on home Topamax 50 mg twice daily  #Depression, insomnia Continued on home Wellbutrin XL 30 mg daily, trazodone 200 mg as needed for insomnia  Other home medications  continued this admission: Vitamin D 2000 units daily, Voltaren gel, Percocet/Roxicet 04/23/2024 daily as needed, Phenergan 25 mg every 6 as needed.  Diet: HH carb modified VTE: Lovenox IVF: none Code: Full  Dispo: Admit patient to Observation with expected length of stay less than 2 midnights.  Signed: Wayland Denis, MD 10/24/2021, 9:35 PM  Pager: [phone] After 5pm on weekdays and 1pm on weekends: On Call pager: [phone]

## 2021-10-24 NOTE — Telephone Encounter (Signed)
   Telephone encounter was:  Unsuccessful.  10/24/2021 Name: Jennifer [name] MRN: [id number] DOB: 11/28/1961  Unsuccessful outbound call made today to assist with:  Food Insecurity  Outreach Attempt:  1st Attempt  A HIPAA compliant voice message was left requesting a return call.  Instructed patient to call back at    nstructed patient to call back at [phone]  at their earliest convenience.   [place], Care Management  [phone] [address] , [place] [address] Email : [email or web address]

## 2021-10-24 NOTE — Telephone Encounter (Signed)
Pt prefers to have a f:f appt as she has limited privacy in the home. Clinician r/s'd Pt to [facility], 11/07/2021 @ 1:00pm. Pt acknowledged & agreed.  Dr. Theodis Shove

## 2021-10-24 NOTE — ED Triage Notes (Signed)
Pt reports having right leg swelling to extended time but became more severe over past two weeks. Yesterday began having drainage from her lower leg. Has chronic sob, denies anything different from baseline. No resp distress is noted.

## 2021-10-24 NOTE — Progress Notes (Signed)
Right lower extremity venous duplex has been completed. Preliminary results can be found in CV Proc through chart review.  Results were given to Dr. Kathrynn Humble.  10/24/21 6:59 PM Jennifer [name] RVT

## 2021-10-24 NOTE — Telephone Encounter (Signed)
Patient called in stating her right knee "popped and there was blood everywhere." States her sock was filled with blood. Denies fever/chills. No openings today at [facility]. She is advised to call her wound doctor, Kalman Shan, DO. States she will call her office now.
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
## 2021-10-25 ENCOUNTER — Encounter (HOSPITAL_COMMUNITY): Payer: Self-pay | Admitting: Internal Medicine

## 2021-10-25 DIAGNOSIS — I5033 Acute on chronic diastolic (congestive) heart failure: Secondary | ICD-10-CM | POA: Diagnosis present

## 2021-10-25 DIAGNOSIS — Z7982 Long term (current) use of aspirin: Secondary | ICD-10-CM | POA: Diagnosis not present

## 2021-10-25 DIAGNOSIS — E785 Hyperlipidemia, unspecified: Secondary | ICD-10-CM | POA: Diagnosis present

## 2021-10-25 DIAGNOSIS — G546 Phantom limb syndrome with pain: Secondary | ICD-10-CM | POA: Diagnosis present

## 2021-10-25 DIAGNOSIS — J449 Chronic obstructive pulmonary disease, unspecified: Secondary | ICD-10-CM | POA: Diagnosis present

## 2021-10-25 DIAGNOSIS — I872 Venous insufficiency (chronic) (peripheral): Secondary | ICD-10-CM | POA: Diagnosis present

## 2021-10-25 DIAGNOSIS — Z7984 Long term (current) use of oral hypoglycemic drugs: Secondary | ICD-10-CM | POA: Diagnosis not present

## 2021-10-25 DIAGNOSIS — E1142 Type 2 diabetes mellitus with diabetic polyneuropathy: Secondary | ICD-10-CM | POA: Diagnosis present

## 2021-10-25 DIAGNOSIS — Z91128 Patient's intentional underdosing of medication regimen for other reason: Secondary | ICD-10-CM | POA: Diagnosis not present

## 2021-10-25 DIAGNOSIS — K219 Gastro-esophageal reflux disease without esophagitis: Secondary | ICD-10-CM | POA: Diagnosis present

## 2021-10-25 DIAGNOSIS — Z79899 Other long term (current) drug therapy: Secondary | ICD-10-CM | POA: Diagnosis not present

## 2021-10-25 DIAGNOSIS — E1122 Type 2 diabetes mellitus with diabetic chronic kidney disease: Secondary | ICD-10-CM | POA: Diagnosis present

## 2021-10-25 DIAGNOSIS — Z86718 Personal history of other venous thrombosis and embolism: Secondary | ICD-10-CM | POA: Diagnosis not present

## 2021-10-25 DIAGNOSIS — I130 Hypertensive heart and chronic kidney disease with heart failure and stage 1 through stage 4 chronic kidney disease, or unspecified chronic kidney disease: Secondary | ICD-10-CM | POA: Diagnosis present

## 2021-10-25 DIAGNOSIS — Z89512 Acquired absence of left leg below knee: Secondary | ICD-10-CM | POA: Diagnosis not present

## 2021-10-25 DIAGNOSIS — T501X6A Underdosing of loop [high-ceiling] diuretics, initial encounter: Secondary | ICD-10-CM | POA: Diagnosis present

## 2021-10-25 DIAGNOSIS — G43109 Migraine with aura, not intractable, without status migrainosus: Secondary | ICD-10-CM | POA: Diagnosis present

## 2021-10-25 DIAGNOSIS — N3946 Mixed incontinence: Secondary | ICD-10-CM | POA: Diagnosis present

## 2021-10-25 DIAGNOSIS — R609 Edema, unspecified: Secondary | ICD-10-CM | POA: Diagnosis present

## 2021-10-25 DIAGNOSIS — E876 Hypokalemia: Secondary | ICD-10-CM | POA: Diagnosis present

## 2021-10-25 DIAGNOSIS — Z6839 Body mass index (BMI) 39.0-39.9, adult: Secondary | ICD-10-CM | POA: Diagnosis not present

## 2021-10-25 DIAGNOSIS — I509 Heart failure, unspecified: Secondary | ICD-10-CM

## 2021-10-25 DIAGNOSIS — M797 Fibromyalgia: Secondary | ICD-10-CM | POA: Diagnosis present

## 2021-10-25 DIAGNOSIS — G4700 Insomnia, unspecified: Secondary | ICD-10-CM | POA: Diagnosis present

## 2021-10-25 DIAGNOSIS — N182 Chronic kidney disease, stage 2 (mild): Secondary | ICD-10-CM | POA: Diagnosis present

## 2021-10-25 DIAGNOSIS — E669 Obesity, unspecified: Secondary | ICD-10-CM | POA: Diagnosis present

## 2021-10-25 LAB — BASIC METABOLIC PANEL
Anion gap: 8 (ref 5–15)
BUN: 15 mg/dL (ref 8–23)
CO2: 26 mmol/L (ref 22–32)
Calcium: 8.8 mg/dL — ABNORMAL LOW (ref 8.9–10.3)
Chloride: 105 mmol/L (ref 98–111)
Creatinine, Ser: 1.04 mg/dL — ABNORMAL HIGH (ref 0.44–1.00)
GFR, Estimated: >60 mL/min (ref >60)
Glucose, Bld: 228 mg/dL — ABNORMAL HIGH (ref 70–99)
Potassium: 3.3 mmol/L — ABNORMAL LOW (ref 3.5–5.1)
Sodium: 139 mmol/L (ref 135–145)

## 2021-10-25 LAB — GLUCOSE, CAPILLARY
Glucose-Capillary: 205 mg/dL — ABNORMAL HIGH (ref 70–99)
Glucose-Capillary: 156 mg/dL — ABNORMAL HIGH (ref 70–99)
Glucose-Capillary: 150 mg/dL — ABNORMAL HIGH (ref 70–99)

## 2021-10-25 LAB — CBC
HCT: 36.1 % (ref 36.0–46.0)
Hemoglobin: 11.8 g/dL — ABNORMAL LOW (ref 12.0–15.0)
MCH: 31.4 pg (ref 26.0–34.0)
MCHC: 32.7 g/dL (ref 30.0–36.0)
MCV: 96 fL (ref 80.0–100.0)
Platelets: 169 10*3/uL (ref 150–400)
RBC: 3.76 MIL/uL — ABNORMAL LOW (ref 3.87–5.11)
RDW: 15.5 % (ref 11.5–15.5)
WBC: 8.4 10*3/uL (ref 4.0–10.5)
nRBC: 0 % (ref 0.0–0.2)

## 2021-10-25 LAB — MAGNESIUM: Magnesium: 1.3 mg/dL — ABNORMAL LOW (ref 1.7–2.4)

## 2021-10-25 MED ORDER — INSULIN ASPART 100 UNIT/ML IJ SOLN
0.0000 [IU] | Freq: Three times a day (TID) | INTRAMUSCULAR | Status: DC
  Administered 2021-10-25: 5 [IU] via SUBCUTANEOUS
  Administered 2021-10-25: 3 [IU] via SUBCUTANEOUS
  Administered 2021-10-26: 5 [IU] via SUBCUTANEOUS
  Administered 2021-10-27: 2 [IU] via SUBCUTANEOUS
  Administered 2021-10-27: 3 [IU] via SUBCUTANEOUS

## 2021-10-25 MED ORDER — INSULIN ASPART 100 UNIT/ML IJ SOLN: 0.0000 [IU] | Freq: Every day | INTRAMUSCULAR | Status: DC

## 2021-10-25 MED ORDER — FUROSEMIDE 10 MG/ML IJ SOLN
60.0000 mg | Freq: Once | INTRAMUSCULAR | Status: AC
  Administered 2021-10-25: 60 mg via INTRAVENOUS
  Filled 2021-10-25: qty 6

## 2021-10-25 MED ORDER — FUROSEMIDE 10 MG/ML IJ SOLN: 40.0000 mg | Freq: Once | INTRAMUSCULAR | Status: DC

## 2021-10-25 MED ORDER — RIVAROXABAN 10 MG PO TABS
10.0000 mg | ORAL_TABLET | Freq: Every day | ORAL | Status: DC
  Administered 2021-10-25 – 2021-10-27 (×3): 10 mg via ORAL
  Filled 2021-10-25 (×3): qty 1

## 2021-10-25 NOTE — Progress Notes (Signed)
Orthopedic Tech Progress Note Patient Details:  Jennifer [name] 1961/07/07 [id number]  Ortho Devices Type of Ortho Device: Haematologist Ortho Device/Splint Location: BLE Ortho Device/Splint Interventions: Ordered, Application, Adjustment   Post Interventions Patient Tolerated: Well Instructions Provided: Care of device, Poper ambulation with device  [name] 10/25/2021, 12:22 AM

## 2021-10-25 NOTE — Hospital Course (Addendum)
#  Acute on chronic HFpEF #Chronic venous insufficiency #PAD with hx of revascularization Patient presented with worsening right lower extremity pitting edema with very small laceration weeping serosanguineous fluid. Worsening of her swelling likely secondary to patient's history of chronic venous insufficiency and HFpEF exacerbation. There is mild erythema surrounding the small laceration on her leg, however, the patient is not having much pain around the site, nor is she having any systemic signs of infection. Erythema could also be secondary to chronic stasis changes from venous insufficiency. 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 She received IV lasix 40 mg on 11/3 with 1.3 L UOP recorded. Her last known dry weight was recorded at 117 kg, however, the patient still remains volume overloaded on exam and her weight is recorded at 106.6 kg today, however, this weight is up 7 lbs compared to her last office visit from 7 days ago. Continued IV diuresis on 11/4 with 60 mg lasix and had 1.5 L UOP. Patient also had unna boot on right lower extremity during admission. Patient did not feel comfortable being discharged on 11/5 as she still felt like she was volume overloaded. Received another dose of IV lasix 60mg  and had 1.4 L UOP. Discharged on home meds and advised to follow up with PCP and wound care.   #Urinary urge incontinence  Patient has a history of urinary incontinence that has been noted to be refractory to medications. She had a cystoscopy with botox injection in August with [facility], however, she states that this did not help her much. She frequently has to use depends diapers in the evening. Has an appt with urology in December, however, called to move this up to 11/11 at 9:30am.   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 Creatinine unchanged from 11/5 to 11/6, at 1.1. K was actually increased to 5.3 and  received one dose of Lokelma. Would recommend repeat BMP at hospital follow up.    #HTN Continued on home amlodipine 10 mg daily, hydrochlorothiazide 25 mg daily, metoprolol succinate 100 mg daily.    #T2DM #Diabetic peripheral neuropathy Patient was continued on metformin 500 mg twice daily.  Patient takes [name] 40u before breakfast at home. Continued on Levemir 40u daily with SSI, as well as lyrica 200 mg tid.

## 2021-10-25 NOTE — Progress Notes (Signed)
Heart Failure Nurse Navigator Progress Note  PCP: Angelica Pou, MD PCP-Cardiologist: none Admission Diagnosis: peripheral edema Admitted from: home with family  Presentation:   Jennifer [name] presented 11/3 for increased edema of RLE. Pt sitting in reclined with legs elevated on room air. Pt interactive with interview process. Pt well established with [facility]MT[facility] as PCP patient. Pt has no cardiologist. Pt has LLE prothesis present, states she ambulates minimally. Has rollator and wheelchair at home. Pt lives with 2 adult sons, her friend and friend's husband. States she is independent of ADLs. Pt has RLE wrapped in [employer].  Pt states she does NOT take her lasix at home as she battles incontinence and feels as tho she is in jail when she takes lasix as she is on the toilet for the remainder of the day. Pt uses 2 briefs during the day and a pad on her bed at night. Pt does not drive ,already enrolled in [employer]. Medications are free/low cost. All bills are "ok".   ECHO/ LVEF: 60-65%, G1DD.   Clinical Course:  Past Medical History:  Diagnosis Date   Acute vestibular syndrome, resolved 03/02/2021   Angioedema 06/14/2021   Asthma    Burn of finger of right hand, second degree 02/05/2021   Occurred during cooking (frying), poor sensation due to neuropathy, pt punctured blister to allow it to drain, skin has since desquamated over dorsal joint, no infection.  Keep clean and dry, OTC antibacterial ointment.   Cataract    CHF (congestive heart failure) (HCC)    Chronic bronchitis ([place])    "I get it alot" (09/28/2013)   Chronic diastolic heart failure (HCC)    grade 2 per 2D echocardiogram (01/2013)   Chronic lower back pain    Chronic pain syndrome 12/03/2011   Likely secondary to depression, "fibromyalgia", neuropathy, and obesity. Lumbar MRI 2014 no sig change from prior (2008) : Stable hypertrophic facet disease most notable at L4-5. Stable shallow left  foraminal/extraforaminal disc protrusion at L4-5. No direct neural compression.       COPD 01/08/2007   PFT's 05/2007 : FEV1/FVC 82, FEV1 64% pred, FEF 25-75% 40% predicted, 16% improvement in FEV1 with bronchodilators.      Depression    Diabetic peripheral neuropathy (HCC)    Dizziness, resolved (admitted with vestibular migraine)    DVT of upper extremity (deep vein thrombosis) ([place]) 03/11/2013   Secondary to PICC line. Right brachial vein, diagnosed on 03/10/2013 Coumadin for 3 months. 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    History of use of hearing aid    Hyperlipidemia    Hyperplastic colon polyp 12/2010   Per colonoscopy (12/2010) - Dr. 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  Evaluated on 07/08/2021 by podiatrist Dr. Amalia Hailey who debrided necrotic tissue and prescribed doxycycline.  He will see her again in 3 weeks.  The location of this ulcer on the dorsal aspect of the toe is somewhat atypical for a purely diabetic foot wound, and she does have a strong DP pulse.  I did not examine h   Type II diabetes  mellitus with peripheral circulatory disorders, uncontrolled DX: 1993   Insulin dep. Poor control. Complicated by diabetic foot ulcer and diabetic eye disease.       Social History   Socioeconomic History   Marital status: Divorced    Spouse name: Not on file   Number of children: 2   Years of education: college   Highest education level: Not on file  Occupational History   Occupation: Disability    Comment: previously worked as a Training and development officer  Tobacco Use   Smoking status: Every Day    Packs/day: 1.00    Years: 50.00    Pack years: 50.00    Types: Cigarettes    Start date: 12/22/1970   Smokeless tobacco: Never   Tobacco comments:    1 PPD, 10/25/2021 states she is not interested in smoking cessation  Vaping Use   Vaping Use: Never used  Substance and Sexual Activity   Alcohol use: No    Alcohol/week: 0.0 standard drinks   Drug use: No    Types: Marijuana, "Crack" cocaine    Comment: 09/28/2013 "no marijuana since 25-Mar-2010, no crack/cocaine 1989"   Sexual activity: Not Currently  Other Topics Concern   Not on file  Social History Narrative   On disability. Lives with son in [place]. Formerly worked as Training and development officer.    Boyfriend passed away stage 4 cancer 03/25/13.   S/p L BKA 03/25/2013. In wheelchair in paritially suitable apartment.    Social Determinants of Health   Financial Resource Strain: Low Risk    Difficulty of Paying Living Expenses: Not very hard  Food Insecurity: No Food Insecurity   Worried About Charity fundraiser in the Last Year: Never true   Ran Out of Food in the Last Year: Never true  Transportation Needs: No Transportation Needs   Lack of Transportation (Medical): No   Lack of Transportation (Non-Medical): No  Physical Activity: Not on file  Stress: Not on file  Social Connections: Not on file   Admission related to vascular insufficiency, if determined related to HF please contact hf navigation team.   HF Navigation team will sign off.   Pricilla Holm, MSN,  RN Heart Failure Nurse Navigator [phone]

## 2021-10-25 NOTE — Plan of Care (Signed)

## 2021-10-25 NOTE — Evaluation (Addendum)
Physical Therapy Evaluation Only Patient Details Name: Jennifer [name] MRN: [id number] DOB: 06-28-1961 Today's Date: 10/25/2021  History of Present Illness  Pt is a 60 y.o. female who presented 10/24/21 with R lower extremity swelling and acute on chronic HFpEF. PMH of HFpEF, COPD, poorly controlled T2DM with peripheral neuropathy, left BKA, HTN, HLD, angioedema, asthma, PVD, spinal stenosis, and urinary incontinence   Clinical Impression  Pt presents with condition above. Pt is functioning at her baseline currently, displays slow, but steady gait using her L lower extremity prosthesis and a RW for stability. Due to her functioning at her baseline, no further acute PT needs identified. However, pt would benefit from follow-up Outpatient PT to address her deficits in gross strength, R shoulder ROM (rotator cuff injury per pt), and balance as pt does report a hx of many falls. Pt has a lot of support to assist her as needed at home. All education completed and questions answered, PT will sign off.     Recommendations for follow up therapy are one component of a multi-disciplinary discharge planning process, led by the attending physician.  Recommendations may be updated based on patient status, additional functional criteria and insurance authorization.  Follow Up Recommendations Outpatient PT    Assistance Recommended at Discharge PRN  Functional Status Assessment Patient has not had a recent decline in their functional status (hx of many falls and worsening of R shoulder strength/ROM from rotator cuff tear)  Equipment Recommendations  None recommended by PT (has all needed equipment)    Recommendations for Other Services       Precautions / Restrictions Precautions Precautions: Fall;Other (comment) Precaution Comments: prior L BKA (prosthesis in room), prior R lateral toe amputations Restrictions Weight Bearing Restrictions: No      Mobility  Bed Mobility Overal bed mobility:  Modified Independent             General bed mobility comments: Pt transition supine > sit with extra time but no safety concerns.    Transfers Overall transfer level: Needs assistance Equipment used: Rolling walker (2 wheels) Transfers: Sit to/from Stand Sit to Stand: Supervision           General transfer comment: Supervision for safety with L prosthesis donned, no LOB.    Ambulation/Gait Ambulation/Gait assistance: Supervision Gait Distance (Feet): 100 Feet Assistive device: Rolling walker (2 wheels) Gait Pattern/deviations: Step-through pattern;Decreased stride length;Trunk flexed;Wide base of support Gait velocity: reduced Gait velocity interpretation: <1.8 ft/sec, indicate of risk for recurrent falls General Gait Details: Pt with slow, but steady gait with L prosthesis donned. No LOB, supervision for safety. Pt reports this has been her baseline.  Stairs            Wheelchair Mobility    Modified Rankin (Stroke Patients Only)       Balance Overall balance assessment: Needs assistance Sitting-balance support: No upper extremity supported;Feet supported Sitting balance-Leahy Scale: Good     Standing balance support: No upper extremity supported;Bilateral upper extremity supported;During functional activity Standing balance-Leahy Scale: Fair Standing balance comment: Able to donn mask without UE support in standing but uses RW for mobility.                             Pertinent Vitals/Pain Pain Assessment: Faces Faces Pain Scale: Hurts a little bit Pain Location: R leg Pain Descriptors / Indicators: Discomfort;Grimacing Pain Intervention(s): Limited activity within patient's tolerance;Monitored during session;Repositioned    Home Living Family/patient  expects to be discharged to:: Private residence Living Arrangements: Children;Non-relatives/Friends Available Help at Discharge: Family;Available 24 hours/day Type of Home: House Home  Access: Stairs to enter Entrance Stairs-Rails: Right (ascending, L handrail is untrustworthy per pt) Entrance Stairs-Number of Steps: 5   Home Layout: One level Home Equipment: Conservation officer, nature (2 wheels);Rollator (4 wheels);Tub bench;Wheelchair - manual      Prior Function Prior Level of Function : Independent/Modified Independent;History of Falls (last six months)             Mobility Comments: Pt uses rollator and manual w/c for mod I mobility, ambulating household distances only; pt reports many recent falls ADLs Comments: Pt calls transportation services for transport; family shops with her and she uses electric cart; bathes self in sitting     Hand Dominance   Dominant Hand: Right    Extremity/Trunk Assessment   Upper Extremity Assessment Upper Extremity Assessment: RUE deficits/detail RUE Deficits / Details: Limited R shoulder AROM with flexion to about 90 degrees with pt reporting rotator cuff tears, but increased weakness and pain recently    Lower Extremity Assessment Lower Extremity Assessment: LLE deficits/detail;RLE deficits/detail RLE Deficits / Details: Prior lateral 2 toes amputation; edema noted with bandage dressing on lower leg; MMT scores of 4 to 4+ grossly; no sensation to touch deep or light at foot, but improved sensation superior to foot RLE Sensation: decreased light touch RLE Coordination: decreased gross motor LLE Deficits / Details: Prior BKA, reports sensation at residual limb intact; MMT score of 4+ grossly    Cervical / Trunk Assessment Cervical / Trunk Assessment: Kyphotic  Communication   Communication: No difficulties  Cognition Arousal/Alertness: Awake/alert Behavior During Therapy: WFL for tasks assessed/performed Overall Cognitive Status: Within Functional Limits for tasks assessed                                          General Comments General comments (skin integrity, edema, etc.): Educated pt on weighing self  regularly, increasing frequency of activity, and limiting sodium, processed foods, and fluid intake to manage CHF    Exercises     Assessment/Plan    PT Assessment All further PT needs can be met in the next venue of care  PT Problem List Decreased strength;Decreased range of motion;Decreased activity tolerance;Decreased balance;Decreased mobility;Cardiopulmonary status limiting activity;Impaired sensation;Obesity;Decreased skin integrity       PT Treatment Interventions      PT Goals (Current goals can be found in the Care Plan section)  Acute Rehab PT Goals Patient Stated Goal: to get therapy for her R shoulder and falls PT Goal Formulation: All assessment and education complete, DC therapy Time For Goal Achievement: 10/26/21 Potential to Achieve Goals: Good    Frequency     Barriers to discharge        Co-evaluation               AM-PAC PT "6 Clicks" Mobility  Outcome Measure Help needed turning from your back to your side while in a flat bed without using bedrails?: None Help needed moving from lying on your back to sitting on the side of a flat bed without using bedrails?: None Help needed moving to and from a bed to a chair (including a wheelchair)?: A Little Help needed standing up from a chair using your arms (e.g., wheelchair or bedside chair)?: A Little Help needed to walk in hospital  room?: A Little Help needed climbing 3-5 steps with a railing? : A Little 6 Click Score: 20    End of Session Equipment Utilized During Treatment: Gait belt Activity Tolerance: Patient tolerated treatment well Patient left: in bed;with call bell/phone within reach (requested no bed alarm) Nurse Communication: Mobility status;Other (comment) (pt requesting no bed alarm and verbally reported she would call for nursing if she needed to go to bathroom) PT Visit Diagnosis: Other abnormalities of gait and mobility (R26.89);Muscle weakness (generalized) (M62.81);History of falling  (Z91.81);Difficulty in walking, not elsewhere classified (R26.2)    Time: 3299-2426 PT Time Calculation (min) (ACUTE ONLY): 29 min   Charges:   PT Evaluation $PT Eval Moderate Complexity: 1 Mod PT Treatments $Therapeutic Activity: 8-22 mins        Moishe Spice, PT, DPT Acute Rehabilitation Services  Pager: [phone] Office: [phone]   Orvan Falconer 10/25/2021, 3:32 PM

## 2021-10-25 NOTE — Evaluation (Signed)
Occupational Therapy Evaluation Patient Details Name: Jennifer [name] MRN: [id number] DOB: 1961-05-13 Today's Date: 10/25/2021   History of Present Illness Pt is a 60 y.o. female who presented 10/24/21 with R lower extremity swelling and acute on chronic HFpEF. PMH of HFpEF, COPD, poorly controlled T2DM with peripheral neuropathy, left BKA, HTN, HLD, angioedema, asthma, PVD, spinal stenosis, and urinary incontinence   Clinical Impression   Patient admitted for the above diagnosis.  OT discussed CHF and daily weights, patient's admits to not completing, and verbalizes needing to weigh herself daily.  PTA she lives in a home with 24 hour assist as needed from family and friends.  Patient was able to complete ADL from a seated position, walked household distances with RW, and did receive assist from family for home management, community mobility and meal prep.  She presents with decreased activity tolerance and mild unsteadiness, but is close to her baseline.  The patient states she would like to go to outpatient rehab for her shoulder.  No acute OT needs identified, encouraged her to continue to walk with the mobility team, and to perform her own ADL while here.       Recommendations for follow up therapy are one component of a multi-disciplinary discharge planning process, led by the attending physician.  Recommendations may be updated based on patient status, additional functional criteria and insurance authorization.   Follow Up Recommendations  No OT follow up    Assistance Recommended at Discharge Intermittent Supervision/Assistance  Functional Status Assessment  Patient has not had a recent decline in their functional status  Equipment Recommendations  None recommended by OT    Recommendations for Other Services       Precautions / Restrictions Precautions Precautions: Other (comment) Precaution Comments: prior L BKA (prosthesis in room), prior R lateral toe  amputations Restrictions Weight Bearing Restrictions: No      Mobility Bed Mobility Overal bed mobility: Modified Independent             General bed mobility comments: Pt transition supine > sit with extra time but no safety concerns.    Transfers Overall transfer level: Needs assistance Equipment used: Rolling walker (2 wheels) Transfers: Sit to/from Stand Sit to Stand: Supervision           General transfer comment: Supervision for safety with L prosthesis donned, no LOB.      Balance Overall balance assessment: Needs assistance Sitting-balance support: No upper extremity supported;Feet supported Sitting balance-Leahy Scale: Normal     Standing balance support: Reliant on assistive device for balance Standing balance-Leahy Scale: Fair Standing balance comment: Able to donn mask without UE support in standing but uses RW for mobility.                           ADL either performed or assessed with clinical judgement   ADL Overall ADL's : At baseline                                       General ADL Comments: Able to bathe and dress herself seated sinkside with setup.     Vision Patient Visual Report: No change from baseline       Perception Perception Perception: Not tested   Praxis Praxis Praxis: Not tested    Pertinent Vitals/Pain Pain Assessment: Faces Faces Pain Scale: Hurts a little bit Pain Location:  back Pain Descriptors / Indicators: Aching Pain Intervention(s): Monitored during session     Hand Dominance Right   Extremity/Trunk Assessment Upper Extremity Assessment Upper Extremity Assessment: RUE deficits/detail RUE Deficits / Details: patient with 2 prior R shoulder sugeries, ? RCT - partial RUE: Shoulder pain with ROM RUE Sensation: WNL RUE Coordination: WNL   Lower Extremity Assessment Lower Extremity Assessment: Defer to PT evaluation RLE Deficits / Details: Prior lateral 2 toes amputation; edema  noted with bandage dressing on lower leg; MMT scores of 4 to 4+ grossly; no sensation to touch deep or light at foot, but improved sensation superior to foot RLE Sensation: decreased light touch RLE Coordination: decreased gross motor LLE Deficits / Details: Prior BKA, reports sensation at residual limb intact; MMT score of 4+ grossly   Cervical / Trunk Assessment Cervical / Trunk Assessment: Kyphotic   Communication Communication Communication: No difficulties;Other (comment) (poor dentation)   Cognition Arousal/Alertness: Awake/alert Behavior During Therapy: WFL for tasks assessed/performed Overall Cognitive Status: Within Functional Limits for tasks assessed                                       General Comments  Educated pt on weighing self regularly, increasing frequency of activity, and limiting sodium, processed foods, and fluid intake to manage CHF    Exercises     Shoulder Instructions      Home Living Family/patient expects to be discharged to:: Private residence Living Arrangements: Children;Non-relatives/Friends Available Help at Discharge: Family;Available 24 hours/day Type of Home: House Home Access: Stairs to enter [employer] of Steps: 5 Entrance Stairs-Rails: Right (ascending, L handrail is untrustworthy per pt) Home Layout: One level     Bathroom Shower/Tub: Teacher, early years/pre: Standard     Home Equipment: Conservation officer, nature (2 wheels);Rollator (4 wheels);Tub bench;Wheelchair - manual          Prior Functioning/Environment Prior Level of Function : Independent/Modified Independent;History of Falls (last six months)             Mobility Comments: Pt uses rollator and manual w/c for mod I mobility, ambulating household distances only; pt reports many recent falls ADLs Comments: Pt calls transportation services for transport; family shops with her and she uses electric cart; bathes and dresses herself in  sitting.  No assist with prosthetic        OT Problem List: Decreased activity tolerance;Impaired balance (sitting and/or standing)      OT Treatment/Interventions:      OT Goals(Current goals can be found in the care plan section) Acute Rehab OT Goals Patient Stated Goal: Get this fluid off so I can go home OT Goal Formulation: With patient Time For Goal Achievement: 10/25/21 Potential to Achieve Goals: Good  OT Frequency:     Barriers to D/C:  None noted          Co-evaluation              AM-PAC OT "6 Clicks" Daily Activity     Outcome Measure Help from another person eating meals?: None Help from another person taking care of personal grooming?: None Help from another person toileting, which includes using toliet, bedpan, or urinal?: A Little Help from another person bathing (including washing, rinsing, drying)?: A Little Help from another person to put on and taking off regular upper body clothing?: None Help from another person to put on and taking  off regular lower body clothing?: A Little 6 Click Score: 21   End of Session Equipment Utilized During Treatment: Gait belt;Rolling walker (2 wheels)  Activity Tolerance: Patient tolerated treatment well Patient left: in bed;with call bell/phone within reach  OT Visit Diagnosis: Unsteadiness on feet (R26.81)                Time: 6378-5885 OT Time Calculation (min): 18 min Charges:  OT General Charges $OT Visit: 1 Visit OT Evaluation $OT Eval Moderate Complexity: 1 Mod  10/25/2021  RP, OTR/L  Acute Rehabilitation Services  Office:  [phone]   Metta Clines 10/25/2021, 5:16 PM

## 2021-10-25 NOTE — Progress Notes (Signed)
HD#1 SUBJECTIVE:  Patient Summary: Jennifer [name] is a 60 y.o. with a pertinent PMH of HFpEF, COPD, poorly controlled T2DM with peripheral neuropathy, left AKA, HTN, HLD, and urinary incontinence who presented with right lower extremity swelling and admitted for acute on chronic HFpEF.   Overnight Events: No acute events overnight  Interim History: This is hospital day 1 for Jennifer [name] who was seen and evaluated at the bedside this morning. She endorses a lot of pain in her right leg and describes this as throbbing. The patient notes that she has osteoarthritis, neuropathy, and fibromyalgia which contribute to her chronic pain. The patient notes that she also has urinary incontinence, which makes the Lasix difficult for her, as she is urinating quite frequently. She denies any chest pain or tightness.   OBJECTIVE:  Vital Signs: Vitals:   10/25/21 0406 10/25/21 0500 10/25/21 0718 10/25/21 0741  BP: (!) 158/74   122/90  Pulse: 72   72  Resp: 20     Temp: (!) 97.5 F (36.4 C)   (!) 97.5 F (36.4 C)  TempSrc: Oral   Oral  SpO2: 98%  99% 95%  Weight:  106.6 kg    Height:       Supplemental O2: Room Air SpO2: 95 %  Filed Weights   10/24/21 1608 10/25/21 0500  Weight: 103.7 kg 106.6 kg     Intake/Output Summary (Last 24 hours) at 10/25/2021 1017 Last data filed at 10/25/2021 0500 Gross per 24 hour  Intake --  Output 1300 ml  Net -1300 ml   Net IO Since Admission: -1,300 mL [10/25/21 1017]  Physical Exam: General: Obese African-American female, NAD HENT: normocephalic, atraumatic CV: regular rate, normal rhythm, no murmurs, rubs, gallops. 2+ pitting edema to the R knee.  Trace dependent pitting edema R upper thigh. Pulmonary: normal work of breathing, lungs clear to auscultation, no rales, wheezes, rhonchi Abdominal: non-distended, soft, non-tender to palpation, normal BS Skin: Warm and dry, mild erythema right lower extremity, with 1 cm laceration with some serosanguineous  drainage. MSK: S/p left AKA, s/p right 4th toe amputation  Neurological: awake, alert and oriented x3, normal speech. No focal deficit Motor: moves all extremities antigravity Psych: normal mood and affect   ASSESSMENT/PLAN:  Assessment: Principal Problem:   Edema of right lower extremity due to peripheral venous insufficiency Active Problems:   Peripheral arterial disease with history of revascularization (HCC)   Chronic diastolic heart failure (HCC)   Urinary incontinence, mixed, urge/stress/functional   Plan: #Acute on chronic HFpEF #Chronic venous insufficiency #PAD with hx of revascularization Patient presented with worsening right lower extremity pitting edema with very small laceration weeping serosanguineous fluid. Worsening of her swelling likely secondary to patient's history of chronic venous insufficiency and HFpEF exacerbation. There is mild erythema surrounding the small laceration on her leg, however, the patient is not having much pain around the site, nor is she having any systemic signs of infection. Erythema could also be secondary to chronic stasis changes from venous insufficiency. She received IV lasix 40 mg yesterday with 1.3 L UOP recorded. Her last known dry weight was recorded at 117 kg, however, the patient still remains volume overloaded on exam and her weight is recorded at 106.6 kg today.  - IV Lasix 60 mg x 1 - Will consider po lasix on discharge - Unna boots - Continue aspirin 81 mg daily - Strict I&Os and daily weights   #Urinary urge incontinence  Patient has a history of urinary incontinence  that has been noted to be refractory to medications. She had a cystoscopy with botox injection in August with [facility], however, she states that this did not help her much. She frequently has to use depends diapers in the evening. Has an appt with urology in December, however, called to move this up. - Appt with urology 11/11 at 9:30am  #Hypokalemia #CKD  2/2 diabetic nephropathy Potassium remains low at 3.3. Creatinine stable at 1.04 with GFR >60.  - Repletion of electrolytes as needed - Follow up BMP and Mg   #HTN Continued on home amlodipine 10 mg daily, hydrochlorothiazide 25 mg daily, metoprolol succinate 100 mg daily. Receiving IV lasix as above.    #T2DM #Diabetic peripheral neuropathy Patient was continued on metformin 500 mg twice daily.  Patient takes [name] 40u before breakfast at home.  - Levemir 40u daily - SSI + CBGs q4 with meals - Continue lyrica 200 mg tid   Best Practice: Diet: Diabetic diet IVF: Fluids: none VTE: Xarelto 10 mg Code: Full AB: None Therapy Recs: Pending DISPO: Anticipated discharge in 1-2 days to Home pending Medical stability.  Signature: Buddy Duty, D.O.  Internal Medicine Resident, PGY-1 [facility]  Pager: [phone] 10:17 AM, 10/25/2021   Please contact the on call pager after 5 pm and on weekends at [phone].

## 2021-10-26 DIAGNOSIS — I872 Venous insufficiency (chronic) (peripheral): Secondary | ICD-10-CM | POA: Diagnosis not present

## 2021-10-26 LAB — GLUCOSE, CAPILLARY
Glucose-Capillary: 114 mg/dL — ABNORMAL HIGH (ref 70–99)
Glucose-Capillary: 135 mg/dL — ABNORMAL HIGH (ref 70–99)
Glucose-Capillary: 217 mg/dL — ABNORMAL HIGH (ref 70–99)
Glucose-Capillary: 139 mg/dL — ABNORMAL HIGH (ref 70–99)

## 2021-10-26 LAB — CBC
HCT: 35.7 % — ABNORMAL LOW (ref 36.0–46.0)
Hemoglobin: 11.6 g/dL — ABNORMAL LOW (ref 12.0–15.0)
MCH: 31.2 pg (ref 26.0–34.0)
MCHC: 32.5 g/dL (ref 30.0–36.0)
MCV: 96 fL (ref 80.0–100.0)
Platelets: 166 10*3/uL (ref 150–400)
RBC: 3.72 MIL/uL — ABNORMAL LOW (ref 3.87–5.11)
RDW: 15.3 % (ref 11.5–15.5)
WBC: 7.5 10*3/uL (ref 4.0–10.5)
nRBC: 0 % (ref 0.0–0.2)

## 2021-10-26 LAB — BASIC METABOLIC PANEL
Anion gap: 9 (ref 5–15)
BUN: 19 mg/dL (ref 8–23)
CO2: 27 mmol/L (ref 22–32)
Calcium: 8.8 mg/dL — ABNORMAL LOW (ref 8.9–10.3)
Chloride: 104 mmol/L (ref 98–111)
Creatinine, Ser: 1.12 mg/dL — ABNORMAL HIGH (ref 0.44–1.00)
GFR, Estimated: 56 mL/min — ABNORMAL LOW (ref >60)
Glucose, Bld: 119 mg/dL — ABNORMAL HIGH (ref 70–99)
Potassium: 3.7 mmol/L (ref 3.5–5.1)
Sodium: 140 mmol/L (ref 135–145)

## 2021-10-26 LAB — MAGNESIUM: Magnesium: 1.3 mg/dL — ABNORMAL LOW (ref 1.7–2.4)

## 2021-10-26 MED ORDER — FUROSEMIDE 10 MG/ML IJ SOLN
60.0000 mg | Freq: Once | INTRAMUSCULAR | Status: AC
  Administered 2021-10-26: 60 mg via INTRAVENOUS
  Filled 2021-10-26: qty 6

## 2021-10-26 MED ORDER — POTASSIUM CHLORIDE CRYS ER 20 MEQ PO TBCR
40.0000 meq | EXTENDED_RELEASE_TABLET | Freq: Once | ORAL | Status: AC
  Administered 2021-10-26: 40 meq via ORAL
  Filled 2021-10-26: qty 2

## 2021-10-26 NOTE — Plan of Care (Signed)

## 2021-10-26 NOTE — Progress Notes (Signed)
HD#1 Subjective:  Overnight Events: No overnight event  Patient is sitting at bedside eating breakfast.  She appears comfortable in no acute distress.  Report persistent right lower extremity swelling.  Report no issue with breathing or shortness of breath.  Patient expressed that she would like to stay here until her lower extremity edema improves before going home due to her urinary incontinence.  Objective:  Vital signs in last 24 hours: Vitals:   10/26/21 0512 10/26/21 0823 10/26/21 0827 10/26/21 0829  BP: 137/79  114/64 114/64  Pulse: 71  71 71  Resp: 17  18   Temp: 97.8 F (36.6 C)  (!) 97.4 F (36.3 C) (!) 97.4 F (36.3 C)  TempSrc: Oral  Oral Oral  SpO2: 93% 91% 92% 92%  Weight: 108.6 kg     Height:       Supplemental O2: Room Air SpO2: 92 %   Physical Exam:  Physical Exam Constitutional:      General: She is not in acute distress.    Appearance: She is not ill-appearing.  HENT:     Head: Normocephalic.  Eyes:     General:        Right eye: No discharge.        Left eye: No discharge.     Conjunctiva/sclera: Conjunctivae normal.  Cardiovascular:     Rate and Rhythm: Normal rate and regular rhythm.     Heart sounds: Normal heart sounds.     Comments: +1 right lower extremity edema.  Unna boots in place. Pulmonary:     Effort: Pulmonary effort is normal. No respiratory distress.  Skin:    General: Skin is warm.     Coloration: Skin is not jaundiced.  Neurological:     General: No focal deficit present.     Mental Status: She is alert and oriented to person, place, and time.  Psychiatric:        Mood and Affect: Mood normal.        Behavior: Behavior normal.    Filed Weights   10/24/21 1608 10/25/21 0500 10/26/21 0512  Weight: 103.7 kg 106.6 kg 108.6 kg     Intake/Output Summary (Last 24 hours) at 10/26/2021 1027 Last data filed at 10/26/2021 0818 Gross per 24 hour  Intake 840 ml  Output 1550 ml  Net -710 ml   Net IO Since Admission: -2,010  mL [10/26/21 1027]  Pertinent Labs: CBC Latest Ref Rng & Units 10/26/2021 10/25/2021 10/24/2021  WBC 4.0 - 10.5 K/uL 7.5 8.4 9.0  Hemoglobin 12.0 - 15.0 g/dL 11.6(L) 11.8(L) 11.8(L)  Hematocrit 36.0 - 46.0 % 35.7(L) 36.1 36.3  Platelets 150 - 400 K/uL 166 169 183    CMP Latest Ref Rng & Units 10/26/2021 10/25/2021 10/24/2021  Glucose 70 - 99 mg/dL 119(H) 228(H) 116(H)  BUN 8 - 23 mg/dL 19 15 15   Creatinine 0.44 - 1.00 mg/dL 1.12(H) 1.04(H) 1.14(H)  Sodium 135 - 145 mmol/L 140 139 143  Potassium 3.5 - 5.1 mmol/L 3.7 3.3(L) 3.2(L)  Chloride 98 - 111 mmol/L 104 105 108  CO2 22 - 32 mmol/L 27 26 26   Calcium 8.9 - 10.3 mg/dL 8.8(L) 8.8(L) 8.6(L)  Total Protein 6.5 - 8.1 g/dL - - -  Total Bilirubin 0.3 - 1.2 mg/dL - - -  Alkaline Phos 38 - 126 U/L - - -  AST 15 - 41 U/L - - -  ALT 0 - 44 U/L - - -    Imaging: No results  found.  Assessment/Plan:   Principal Problem:   Edema of right lower extremity due to peripheral venous insufficiency Active Problems:   Peripheral arterial disease with history of revascularization (HCC)   Chronic diastolic heart failure (HCC)   Acute on chronic heart failure with preserved ejection fraction (HFpEF) (HCC)   Urinary incontinence, mixed, urge/stress/functional   Acute exacerbation of CHF (congestive heart failure) ([place])   Patient Summary: Jennifer [name] is a 60 y.o. with a pertinent PMH of HFpEF, COPD, poorly controlled T2DM with peripheral neuropathy, left AKA, HTN, HLD, and urinary incontinence who presented with right lower extremity swelling and admitted for acute on chronic HFpEF.   Acute on chronic HFpEF Chronic venous insufficiency Patient put out 1550 cc overnight.  Her weight however went up from 235 to 239 pounds.  Patient appears comfortable on exam.  No evidence of pulmonary edema.  Still have persistent right lower extremity swelling.  DVT was ruled out with ultrasound duplex. Unna boot in place.  We will add 1 more dose of IV Lasix for  volume removal. Patient is taking HCTZ at home.  She will not do well on p.o. Lasix after discharge due to her uncontrolled urinary incontinence. - IV Lasix 60 mg once - Replete electrolytes as needed - Strict I&Os and daily weights   Urinary urge incontinence  Patient has a history of urinary incontinence that has been noted to be refractory to medications.  This has prevented her from taking p.o. diuresis at home.   She had a cystoscopy with botox injection in August with [facility], however, she states that this did not help her much. She frequently has to use depends diapers in the evening.  - Appt with urology 11/11 at 9:30am   HTN Continued on home amlodipine 10 mg daily, hydrochlorothiazide 25 mg daily, metoprolol succinate 100 mg daily.    T2DM Diabetic peripheral neuropathy Patient was continued on metformin 500 mg twice daily.  Patient takes [name] 40u before breakfast at home.  CBG well controlled. - Levemir 40u daily - SSI + CBGs q4 with meals - Continue lyrica 200 mg tid     Best Practice: Diet: Diabetic diet IVF: Fluids: none VTE: Xarelto 10 mg Code: Full AB: None Therapy Recs: Outpatient PT DISPO: Anticipated discharge in 1-2 days to Home pending Medical stability.  Gaylan Gerold, DO 10/26/2021, 10:27 AM Pager: [phone]  Please contact the on call pager after 5 pm and on weekends at [phone].

## 2021-10-27 LAB — BASIC METABOLIC PANEL
Anion gap: 9 (ref 5–15)
BUN: 21 mg/dL (ref 8–23)
CO2: 25 mmol/L (ref 22–32)
Calcium: 8.9 mg/dL (ref 8.9–10.3)
Chloride: 102 mmol/L (ref 98–111)
Creatinine, Ser: 1.15 mg/dL — ABNORMAL HIGH (ref 0.44–1.00)
GFR, Estimated: 54 mL/min — ABNORMAL LOW (ref >60)
Glucose, Bld: 155 mg/dL — ABNORMAL HIGH (ref 70–99)
Potassium: 5.3 mmol/L — ABNORMAL HIGH (ref 3.5–5.1)
Sodium: 136 mmol/L (ref 135–145)

## 2021-10-27 LAB — GLUCOSE, CAPILLARY
Glucose-Capillary: 181 mg/dL — ABNORMAL HIGH (ref 70–99)
Glucose-Capillary: 124 mg/dL — ABNORMAL HIGH (ref 70–99)

## 2021-10-27 MED ORDER — SODIUM ZIRCONIUM CYCLOSILICATE 5 G PO PACK
5.0000 g | PACK | Freq: Once | ORAL | Status: AC
  Administered 2021-10-27: 5 g via ORAL
  Filled 2021-10-27: qty 1

## 2021-10-27 MED ORDER — SODIUM ZIRCONIUM CYCLOSILICATE 10 G PO PACK: 10.0000 g | PACK | Freq: Once | ORAL | Status: DC

## 2021-10-27 NOTE — TOC Transition Note (Signed)
Transition of Care [facility]) - CM/SW Discharge Note   Patient Details  Name: Jennifer [name] MRN: [id number] Date of Birth: [birth year]  [facility] CM/SW Contact:  Zenon Mayo, RN Phone Number: 10/27/2021, 10:27 AM   Clinical Narrative:     Patient is from home with two sons and other relatives, she states she will have transportation home today, she states she would like to go to outpatient physical therapy on [address]  NCM made referral thru epic for outpatient physical therapy.  She has a scale but does not weigh herself daily, needs to do better, does not eat a low sodium diet, she states she eats a regular diet. She states she does not want any resources information on low sodium diet.  She states she checks her blood pressure but not often.  She states she has no issues getting her medications. [facility] [address]    Final next level of care: Home/Self Care Barriers to Discharge: No Barriers Identified   Patient Goals and CMS Choice Patient states their goals for this hospitalization and ongoing recovery are:: to return home and go to outpatient therapy   Choice offered to / list presented to : NA  Discharge Placement                       Discharge Plan and Services In-house Referral: NA Discharge Planning Services: CM Consult              DME Agency: NA       HH Arranged: NA          Social Determinants of Health (SDOH) Interventions Food Insecurity Interventions: Intervention Not Indicated Financial Strain Interventions: Intervention Not Indicated Housing Interventions: Intervention Not Indicated Transportation Interventions: Financial planner (previously enrolled.)   Readmission Risk Interventions Readmission Risk Prevention Plan 10/27/2021  Transportation Screening Complete  PCP or Specialist Appt within 3-5 Days Complete  HRI or Home Care Consult Complete  Social Work Consult for [place]  Planning/Counseling Complete  Palliative Care Screening Not Applicable  Medication Review Press photographer) Complete  Some recent data might be hidden

## 2021-10-27 NOTE — Progress Notes (Signed)
Pt discharged home in stable condition. Discharge instructions given. Pt  verbalized understanding. Unna boot changed by ortho tech prior to discharge. No immediate questions or concerns at this time. Discharged from unit via wheelchair.
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
## 2021-10-27 NOTE — Plan of Care (Signed)
  Problem: Education: Goal: Knowledge of General Education information will improve Description: Including pain rating scale, medication(s)/side effects and non-pharmacologic comfort measures Outcome: Progressing   Problem: Clinical Measurements: Goal: Ability to maintain clinical measurements within normal limits will improve Outcome: Completed/Met Goal: Will remain free from infection Outcome: Completed/Met Goal: Diagnostic test results will improve Outcome: Completed/Met Goal: Cardiovascular complication will be avoided Outcome: Completed/Met   Problem: Activity: Goal: Risk for activity intolerance will decrease Outcome: Completed/Met

## 2021-10-27 NOTE — Plan of Care (Signed)

## 2021-10-27 NOTE — Discharge Instructions (Addendum)
FOLLOW-UP INSTRUCTIONS:  Thank you for allowing [place] to be part of your care. You were hospitalized for Acute on chronic heart failure with preserved ejection fraction (HFpEF) ([place])   Please follow up with the following providers: A. Angelica Pou, MD, [address] / [place] [address], [phone]  B. [facility] on November 11th at 9:30 am (replacing your December appointment_  Please note these changes made to your medications:   A. Medications to continue: Current Meds  Medication Sig   albuterol (PROAIR HFA) 108 (90 Base) MCG/ACT inhaler INHALE 2 PUFFS BY MOUTH EVERY 6 HOURS AS NEEDED FOR WHEEZING (Patient taking differently: Inhale 2 puffs into the lungs every 6 (six) hours as needed for wheezing or shortness of breath. INHALE 2 PUFFS BY MOUTH EVERY 6 HOURS AS NEEDED FOR WHEEZING)   amLODipine (NORVASC) 10 MG tablet Take 1 tablet (10 mg total) by mouth daily.   aspirin EC 81 MG tablet Take 1 tablet (81 mg total) by mouth daily.   buPROPion (WELLBUTRIN XL) 300 MG 24 hr tablet Take 1 tablet (300 mg total) by mouth daily.   Cholecalciferol (VITAMIN D) 50 MCG (2000 UT) CAPS Take 1 capsule (2,000 Units total) by mouth daily.   diclofenac Sodium (VOLTAREN) 1 % GEL Apply 2 g topically 4 (four) times daily. For the right shoulder pain.   hydrochlorothiazide (HYDRODIURIL) 25 MG tablet Take 25 mg by mouth daily.   insulin degludec (TRESIBA FLEXTOUCH) 200 UNIT/ML FlexTouch Pen Inject 40 Units into the skin daily before breakfast.   metFORMIN (GLUCOPHAGE) 500 MG tablet Take 1 tablet (500 mg total) by mouth 2 (two) times daily.   metoprolol succinate (TOPROL-XL) 100 MG 24 hr tablet TAKE 1 TABLET BY MOUTH DAILY. TAKE WITH OR IMMEDIATELY FOLLOWING A MEAL.   MYRBETRIQ 50 MG TB24 tablet Take 50 mg by mouth daily.   nystatin powder Apply 1 application topically 3 (three) times daily.   omeprazole (PRILOSEC) 20 MG capsule Take 2 capsules (40 mg total) by mouth daily.    oxyCODONE-acetaminophen (PERCOCET/ROXICET) 5-325 MG tablet Take 1 tablet by mouth every 8 (eight) hours as needed for severe pain or moderate pain. May take an additional 1 tablet each day for severe pain up to 10 days out of the month.   OZEMPIC, 0.25 OR 0.5 MG/DOSE, 2 MG/1.5ML SOPN Inject 0.5 mg into the skin every Monday for 24 doses.   pregabalin (LYRICA) 200 MG capsule TAKE 1 CAPSULE BY MOUTH THREE TIMES A DAY (Patient taking differently: Take 200 mg by mouth in the morning, at noon, and at bedtime.)   promethazine (PHENERGAN) 25 MG tablet Take 1 tablet (25 mg total) by mouth every 6 (six) hours as needed for nausea or vomiting.   rosuvastatin (CRESTOR) 20 MG tablet Take 1 tablet (20 mg total) by mouth in the morning.   topiramate (TOPAMAX) 50 MG tablet TAKE 1 TABLET BY MOUTH TWICE A DAY (Patient taking differently: Take 50 mg by mouth 2 (two) times daily.)   traZODone (DESYREL) 100 MG tablet Take 200 mg by mouth at bedtime as needed for sleep.   umeclidinium-vilanterol (ANORO ELLIPTA) 62.5-25 MCG/INH AEPB Inhale 1 puff into the lungs in the morning.      B. Medications to start:  No new medications  C. Medications to discontinue:  None   Please make sure to follow up with the wound care clinic on Tuesday, as you have already scheduled.   Please call our clinic if you have any questions  or concerns, we may be able to help and keep you from a long and expensive emergency room wait. Our clinic and after hours phone number is [phone], the best time to call is Monday through Friday 9 am to 4 pm but there is always someone available 24/7 if you have an emergency. If you need medication refills please notify your pharmacy one week in advance and they will send [place] a request.

## 2021-10-27 NOTE — Discharge Summary (Addendum)
Name: Jennifer [name] MRN: [id number] DOB: 1961/04/16 60 y.o. PCP: Angelica Pou, MD  Date of Admission: 10/24/2021  2:41 PM Date of Discharge:  10/27/2021 Attending Physician: Dr. Dareen Piano  DISCHARGE DIAGNOSIS:  Primary Problem: Acute on chronic heart failure with preserved ejection fraction (HFpEF) [facility])   Hospital Problems: Principal Problem:   Acute on chronic heart failure with preserved ejection fraction (HFpEF) ([facility]) Active Problems:   Peripheral arterial disease with history of revascularization (HCC)   Chronic diastolic heart failure (HCC)   Urinary incontinence, mixed, urge/stress/functional   Edema of right lower extremity due to peripheral venous insufficiency   Acute exacerbation of CHF (congestive heart failure) ([facility])    DISCHARGE MEDICATIONS:   Allergies as of 10/27/2021       Reactions   Benazepril Swelling   Possible tongue swelling 06/14/2021   Pineapple Swelling   Fresh pineapple - lip swelling   Abilify [aripiprazole] Other (See Comments)   Urinary freq Nov 2016   Iohexol Other (See Comments)   IV CONTRAST CAUSED TRANSIENT NEPHROPATHY (KIDNEY INSUFFICIENCY) IN 2007   Ivp Dye [iodinated Diagnostic Agents] Other (See Comments)   IV CONTRAST CAUSED TRANSIENT NEPHROPATHY (KIDNEY INSUFFICIENCY) IN 2007   Morphine Sulfate Itching, Rash        Medication List     TAKE these medications    albuterol 108 (90 Base) MCG/ACT inhaler Commonly known as: ProAir HFA INHALE 2 PUFFS BY MOUTH EVERY 6 HOURS AS NEEDED FOR WHEEZING What changed:  how much to take how to take this when to take this reasons to take this   amLODipine 10 MG tablet Commonly known as: NORVASC Take 1 tablet (10 mg total) by mouth daily.   Anoro Ellipta 62.5-25 MCG/ACT Aepb Generic drug: umeclidinium-vilanterol Inhale 1 puff into the lungs in the morning.   aspirin EC 81 MG tablet Take 1 tablet (81 mg total) by mouth daily.   buPROPion 300 MG 24 hr tablet Commonly known  as: WELLBUTRIN XL Take 1 tablet (300 mg total) by mouth daily.   diclofenac Sodium 1 % Gel Commonly known as: Voltaren Apply 2 g topically 4 (four) times daily. For the right shoulder pain.   hydrochlorothiazide 25 MG tablet Commonly known as: HYDRODIURIL Take 25 mg by mouth daily.   Insulin Pen Needle 32G X 4 MM Misc Use to inject insulin 4 times a day and semaglutide once weekly   metFORMIN 500 MG tablet Commonly known as: GLUCOPHAGE Take 1 tablet (500 mg total) by mouth 2 (two) times daily.   metoprolol succinate 100 MG 24 hr tablet Commonly known as: TOPROL-XL TAKE 1 TABLET BY MOUTH DAILY. TAKE WITH OR IMMEDIATELY FOLLOWING A MEAL.   Myrbetriq 50 MG Tb24 tablet Generic drug: mirabegron ER Take 50 mg by mouth daily.   nystatin powder Commonly known as: nystatin Apply 1 application topically 3 (three) times daily.   omeprazole 20 MG capsule Commonly known as: PRILOSEC Take 2 capsules (40 mg total) by mouth daily.   onetouch ultrasoft lancets Use as instructed   OneTouch Verio Flex System w/Device Kit Check 4 times a day   OneTouch Verio test strip Generic drug: glucose blood 1 each by Other route as needed for other. Use as instructed. Tests 3-4 times daily.   oxyCODONE-acetaminophen 5-325 MG tablet Commonly known as: PERCOCET/ROXICET Take 1 tablet by mouth every 8 (eight) hours as needed for severe pain or moderate pain. May take an additional 1 tablet each day for severe pain up to 10  days out of the month.   Ozempic (0.25 or 0.5 MG/DOSE) 2 MG/1.5ML Sopn Generic drug: Semaglutide(0.25 or 0.5MG/DOS) Inject 0.5 mg into the skin every Monday for 24 doses.   pregabalin 200 MG capsule Commonly known as: LYRICA TAKE 1 CAPSULE BY MOUTH THREE TIMES A DAY What changed:  how much to take how to take this when to take this additional instructions   promethazine 25 MG tablet Commonly known as: PHENERGAN Take 1 tablet (25 mg total) by mouth every 6 (six) hours as  needed for nausea or vomiting.   rosuvastatin 20 MG tablet Commonly known as: CRESTOR Take 1 tablet (20 mg total) by mouth in the morning.   topiramate 50 MG tablet Commonly known as: TOPAMAX TAKE 1 TABLET BY MOUTH TWICE A DAY   traZODone 100 MG tablet Commonly known as: DESYREL Take 200 mg by mouth at bedtime as needed for sleep.   [name] FlexTouch 200 UNIT/ML FlexTouch Pen Generic drug: insulin degludec Inject 40 Units into the skin daily before breakfast.   Vitamin D 50 MCG (2000 UT) Caps Take 1 capsule (2,000 Units total) by mouth daily.        DISPOSITION AND FOLLOW-UP:  [name] was discharged from [facility] in Stable condition. At the hospital follow up visit please address:  Follow-up Recommendations: Consults: Urology on 11/11 at 9:30am for urinary incontinence  Labs: Basic Metabolic Profile (recheck Cr and kidney function) Studies: None Medications: No changes to medications  Follow-up Appointments:  Follow-up Information     [facility] .   Contact information: [place] [address] [phone]         Angelica Pou, MD Follow up.   Specialty: Internal Medicine Contact information: [address] [address] [phone]                 HOSPITAL COURSE:  Patient Summary: #Acute on chronic HFpEF #Chronic venous insufficiency #PAD with hx of revascularization Patient presented with worsening right lower extremity pitting edema with very small laceration weeping serosanguineous fluid. Worsening of her swelling likely secondary to patient's history of chronic venous insufficiency and HFpEF exacerbation. There is mild erythema surrounding the small laceration on her leg, however, the patient is not having much pain around the site, nor is she having any systemic signs of infection. Erythema could also be secondary to chronic stasis changes from venous  insufficiency. 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 She received IV lasix 40 mg on 11/3 with 1.3 L UOP recorded. Her last known dry weight was recorded at 117 kg, however, the patient still remains volume overloaded on exam and her weight is recorded at 106.6 kg today, however, this weight is up 7 lbs compared to her last office visit from 7 days ago. Continued IV diuresis on 11/4 with 60 mg lasix and had 1.5 L UOP. Patient also had unna boot on right lower extremity during admission. Patient did not feel comfortable being discharged on 11/5 as she still felt like she was volume overloaded. Received another dose of IV lasix 43m and had 1.4 L UOP. Discharged on home meds and advised to follow up with PCP and wound care.   #Urinary urge incontinence  Patient has a history of urinary incontinence that has been noted to be refractory to medications. She had a cystoscopy with botox injection in August with [facility], however, she states that  this did not help her much. She frequently has to use depends diapers in the evening. Has an appt with urology in December, however, called to move this up to 11/11 at 9:30am.   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 Creatinine unchanged from 11/5 to 11/6, at 1.1. K was actually increased to 5.3 and received one dose of Lokelma. Would recommend repeat BMP at hospital follow up.    #HTN Continued on home amlodipine 10 mg daily, hydrochlorothiazide 25 mg daily, metoprolol succinate 100 mg daily.    #T2DM #Diabetic peripheral neuropathy Patient was continued on metformin 500 mg twice daily.  Patient takes [name] 40u before breakfast at home. Continued on Levemir 40u daily with SSI, as well as lyrica 200 mg tid.    DISCHARGE INSTRUCTIONS:   Discharge Instructions     (HEART FAILURE PATIENTS) Call MD:  Anytime you have any of the following symptoms: 1) 3 pound weight  gain in 24 hours or 5 pounds in 1 week 2) shortness of breath, with or without a dry hacking cough 3) swelling in the hands, feet or stomach 4) if you have to sleep on extra pillows at night in order to breathe.   Complete by: As directed    Diet - low sodium heart healthy   Complete by: As directed    Increase activity slowly   Complete by: As directed      FOLLOW-UP INSTRUCTIONS:  Thank you for allowing [place] to be part of your care. You were hospitalized for Acute on chronic heart failure with preserved ejection fraction (HFpEF) ([facility])   Please follow up with the following providers: A. Angelica Pou, MD, [address] / [place] [address], [phone] specifically to assess volume status and kidney function.   [facility] on November 11th at 9:30 am (replacing your December appointment)  Please note these changes made to your medications:   A. Medications to continue: Current Meds  Medication Sig   albuterol (PROAIR HFA) 108 (90 Base) MCG/ACT inhaler INHALE 2 PUFFS BY MOUTH EVERY 6 HOURS AS NEEDED FOR WHEEZING (Patient taking differently: Inhale 2 puffs into the lungs every 6 (six) hours as needed for wheezing or shortness of breath. INHALE 2 PUFFS BY MOUTH EVERY 6 HOURS AS NEEDED FOR WHEEZING)   amLODipine (NORVASC) 10 MG tablet Take 1 tablet (10 mg total) by mouth daily.   aspirin EC 81 MG tablet Take 1 tablet (81 mg total) by mouth daily.   buPROPion (WELLBUTRIN XL) 300 MG 24 hr tablet Take 1 tablet (300 mg total) by mouth daily.   Cholecalciferol (VITAMIN D) 50 MCG (2000 UT) CAPS Take 1 capsule (2,000 Units total) by mouth daily.   diclofenac Sodium (VOLTAREN) 1 % GEL Apply 2 g topically 4 (four) times daily. For the right shoulder pain.   hydrochlorothiazide (HYDRODIURIL) 25 MG tablet Take 25 mg by mouth daily.   insulin degludec (TRESIBA FLEXTOUCH) 200 UNIT/ML FlexTouch Pen Inject 40 Units into the skin daily before breakfast.   metFORMIN (GLUCOPHAGE) 500 MG  tablet Take 1 tablet (500 mg total) by mouth 2 (two) times daily.   metoprolol succinate (TOPROL-XL) 100 MG 24 hr tablet TAKE 1 TABLET BY MOUTH DAILY. TAKE WITH OR IMMEDIATELY FOLLOWING A MEAL.   MYRBETRIQ 50 MG TB24 tablet Take 50 mg by mouth daily.   nystatin powder Apply 1 application topically 3 (three) times daily.   omeprazole (PRILOSEC) 20 MG capsule Take  2 capsules (40 mg total) by mouth daily.   oxyCODONE-acetaminophen (PERCOCET/ROXICET) 5-325 MG tablet Take 1 tablet by mouth every 8 (eight) hours as needed for severe pain or moderate pain. May take an additional 1 tablet each day for severe pain up to 10 days out of the month.   OZEMPIC, 0.25 OR 0.5 MG/DOSE, 2 MG/1.5ML SOPN Inject 0.5 mg into the skin every Monday for 24 doses.   pregabalin (LYRICA) 200 MG capsule TAKE 1 CAPSULE BY MOUTH THREE TIMES A DAY (Patient taking differently: Take 200 mg by mouth in the morning, at noon, and at bedtime.)   promethazine (PHENERGAN) 25 MG tablet Take 1 tablet (25 mg total) by mouth every 6 (six) hours as needed for nausea or vomiting.   rosuvastatin (CRESTOR) 20 MG tablet Take 1 tablet (20 mg total) by mouth in the morning.   topiramate (TOPAMAX) 50 MG tablet TAKE 1 TABLET BY MOUTH TWICE A DAY (Patient taking differently: Take 50 mg by mouth 2 (two) times daily.)   traZODone (DESYREL) 100 MG tablet Take 200 mg by mouth at bedtime as needed for sleep.   umeclidinium-vilanterol (ANORO ELLIPTA) 62.5-25 MCG/INH AEPB Inhale 1 puff into the lungs in the morning.      B. Medications to start:  No new medications  C. Medications to discontinue:  None   Please make sure to follow up with the wound care clinic on Tuesday, as you have already scheduled.   Please call our clinic if you have any questions or concerns, we may be able to help and keep you from a long and expensive emergency room wait. Our clinic and after hours phone number is [phone], the best time to call is Monday through Friday 9  am to 4 pm but there is always someone available 24/7 if you have an emergency. If you need medication refills please notify your pharmacy one week in advance and they will send [place] a request.    SUBJECTIVE:  Jennifer [name] was seen and evaluated on the day of discharge. Her right lower extremity swelling is markedly improved. She has no shortness of breath or chest pain. She does endorse some chronic pain, specifically phantom limb pain. Patient had good urine output with the IV lasix.   Discharge Vitals:   BP 109/61   Pulse 74   Temp 98 F (36.7 C) (Oral)   Resp 17   Ht _0  (1.651 m)   Wt 108.8 kg   SpO2 96%   BMI 39.91 kg/m   OBJECTIVE:  General: No acute distress. CV: RRR. No murmurs, rubs, or gallops. Trace RLE edema, unna boot in place.  Pulmonary: Lungs CTAB. Normal effort. Abdominal: Soft, nontender, nondistended. Normal bowel sounds. Extremities: Palpable radial and DP pulses. Normal ROM. Skin: Warm and dry. No obvious rash or lesions. Neuro: A&Ox3. Moves all extremities. Normal sensation. No focal deficit. Psych: Normal mood and affect   Pertinent Labs, Studies, and Procedures:  CBC Latest Ref Rng & Units 10/26/2021 10/25/2021 10/24/2021  WBC 4.0 - 10.5 K/uL 7.5 8.4 9.0  Hemoglobin 12.0 - 15.0 g/dL 11.6(L) 11.8(L) 11.8(L)  Hematocrit 36.0 - 46.0 % 35.7(L) 36.1 36.3  Platelets 150 - 400 K/uL 166 169 183    CMP Latest Ref Rng & Units 10/27/2021 10/26/2021 10/25/2021  Glucose 70 - 99 mg/dL 155(H) 119(H) 228(H)  BUN 8 - 23 mg/dL _1 Creatinine 0.44 - 1.00 mg/dL 1.15(H) 1.12(H) 1.04(H)  Sodium 135 - 145 mmol/L 136 140  139  Potassium 3.5 - 5.1 mmol/L 5.3(H) 3.7 3.3(L)  Chloride 98 - 111 mmol/L 102 104 105  CO2 22 - 32 mmol/L _0 Calcium 8.9 - 10.3 mg/dL 8.9 8.8(L) 8.8(L)  Total Protein 6.5 - 8.1 g/dL - - -  Total Bilirubin 0.3 - 1.2 mg/dL - - -  Alkaline Phos 38 - 126 U/L - - -  AST 15 - 41 U/L - - -  ALT 0 - 44 U/L - - -    DG Chest 2 View  Result  Date: 10/24/2021 CLINICAL DATA:  Shortness of breath. EXAM: CHEST - 2 VIEW COMPARISON:  June 14, 2021. FINDINGS: The heart size and mediastinal contours are within normal limits. Both lungs are clear. The visualized skeletal structures are unremarkable. IMPRESSION: No active cardiopulmonary disease. Electronically Signed   By: Marijo Conception M.D.   On: 10/24/2021 15:36   VAS [place] LOWER EXTREMITY VENOUS (DVT) (7a-7p)  Result Date: 10/25/2021  Lower Venous DVT Study Patient Name:  Jennifer [name]  Date of Exam:   10/24/2021 Medical Rec #: [id number]        Accession #:    [id number] Date of Birth: 1961/07/06        Patient Gender: F Patient Age:   65 years Exam Location:  [facility] Procedure:      VAS [place] LOWER EXTREMITY VENOUS (DVT) Referring Phys: Thelma Comp NANAVATI --------------------------------------------------------------------------------  Indications: Swelling.  Risk Factors: None identified. Limitations: Body habitus and poor ultrasound/tissue interface. Comparison Study: No prior studies. Performing Technologist: Oliver Hum RVT  Examination Guidelines: A complete evaluation includes B-mode imaging, spectral Doppler, color Doppler, and power Doppler as needed of all accessible portions of each vessel. Bilateral testing is considered an integral part of a complete examination. Limited examinations for reoccurring indications may be performed as noted. The reflux portion of the exam is performed with the patient in reverse Trendelenburg.  +---------+---------------+---------+-----------+----------+-------------------+ RIGHT    CompressibilityPhasicitySpontaneityPropertiesThrombus Aging      +---------+---------------+---------+-----------+----------+-------------------+ CFV      Full           Yes      Yes                                      +---------+---------------+---------+-----------+----------+-------------------+ SFJ      Full                                                              +---------+---------------+---------+-----------+----------+-------------------+ FV Prox  Full                                                             +---------+---------------+---------+-----------+----------+-------------------+ FV Mid   Full                                                             +---------+---------------+---------+-----------+----------+-------------------+  FV Distal               Yes      Yes                                      +---------+---------------+---------+-----------+----------+-------------------+ PFV      Full                                                             +---------+---------------+---------+-----------+----------+-------------------+ POP      Full           Yes      Yes                                      +---------+---------------+---------+-----------+----------+-------------------+ PTV      Full                                                             +---------+---------------+---------+-----------+----------+-------------------+ PERO                                                  Not well visualized +---------+---------------+---------+-----------+----------+-------------------+   +----+---------------+---------+-----------+----------+--------------+ LEFTCompressibilityPhasicitySpontaneityPropertiesThrombus Aging +----+---------------+---------+-----------+----------+--------------+ CFV Full           Yes      Yes                                 +----+---------------+---------+-----------+----------+--------------+    Summary: RIGHT: - There is no evidence of deep vein thrombosis in the lower extremity. However, portions of this examination were limited- see technologist comments above.  - No cystic structure found in the popliteal fossa.  LEFT: - No evidence of common femoral vein obstruction.  *See table(s) above for measurements and observations. Electronically  signed by Monica Martinez MD on 10/25/2021 at 12:37:52 PM.    Final      Signed: Buddy Duty, D.O.  Internal Medicine Resident, PGY-1 [facility]  Pager: [phone] 9:42 AM, 10/27/2021

## 2021-10-28 ENCOUNTER — Telehealth: Payer: Self-pay | Admitting: *Deleted

## 2021-10-28 ENCOUNTER — Telehealth: Payer: Self-pay | Admitting: Internal Medicine

## 2021-10-28 NOTE — Telephone Encounter (Signed)
-----   Message from Dorethea Clan, DO sent at 10/27/2021  9:03 AM EST ----- Please schedule this patient for a hospital follow up with Dr. Jimmye Norman as soon as Dr. Jimmye Norman is available the week of Nov 14th. Thanks!

## 2021-10-28 NOTE — Telephone Encounter (Signed)
TOC HFU appointment 11/06/2021 at 2:30 pm with Dr. Lorin Glass. Appointment card mailed to patient.

## 2021-10-28 NOTE — Telephone Encounter (Signed)
   Telephone encounter was:  Unsuccessful.  10/28/2021 Name: Jennifer [name] MRN: [id number] DOB: 1961-03-04  Unsuccessful outbound call made today to assist with:  Transportation Needs  and Food Insecurity  Outreach Attempt:  2nd Attempt  A HIPAA compliant voice message was left requesting a return call.  Instructed patient to call back at   Instructed patient to call back at [phone]  at their earliest convenience. .  [place], Care Management  [phone] [address] , [place] [address] Email : [email or web address]

## 2021-10-29 ENCOUNTER — Other Ambulatory Visit: Payer: Self-pay

## 2021-10-29 ENCOUNTER — Telehealth: Payer: Self-pay | Admitting: *Deleted

## 2021-10-29 ENCOUNTER — Encounter (HOSPITAL_BASED_OUTPATIENT_CLINIC_OR_DEPARTMENT_OTHER): Payer: Medicare Other | Attending: Internal Medicine | Admitting: Internal Medicine

## 2021-10-29 DIAGNOSIS — I110 Hypertensive heart disease with heart failure: Secondary | ICD-10-CM | POA: Insufficient documentation

## 2021-10-29 DIAGNOSIS — I5033 Acute on chronic diastolic (congestive) heart failure: Secondary | ICD-10-CM | POA: Diagnosis not present

## 2021-10-29 DIAGNOSIS — L97811 Non-pressure chronic ulcer of other part of right lower leg limited to breakdown of skin: Secondary | ICD-10-CM | POA: Diagnosis not present

## 2021-10-29 DIAGNOSIS — I890 Lymphedema, not elsewhere classified: Secondary | ICD-10-CM | POA: Diagnosis not present

## 2021-10-29 DIAGNOSIS — E11622 Type 2 diabetes mellitus with other skin ulcer: Secondary | ICD-10-CM

## 2021-10-29 DIAGNOSIS — E1151 Type 2 diabetes mellitus with diabetic peripheral angiopathy without gangrene: Secondary | ICD-10-CM | POA: Diagnosis not present

## 2021-10-29 DIAGNOSIS — E1140 Type 2 diabetes mellitus with diabetic neuropathy, unspecified: Secondary | ICD-10-CM | POA: Diagnosis not present

## 2021-10-29 DIAGNOSIS — I5032 Chronic diastolic (congestive) heart failure: Secondary | ICD-10-CM | POA: Diagnosis not present

## 2021-10-29 NOTE — Telephone Encounter (Signed)
.  sw

## 2021-10-29 NOTE — Telephone Encounter (Signed)
Transition Care Management Follow-up Telephone Call Date of discharge and from where: 10-27-2021 How have you been since you were released from the hospital? Yes Any questions or concerns? No  Items Reviewed: Did the pt receive and understand the discharge instructions provided? Yes  Medications obtained and verified? Yes  Other? No  Any new allergies since your discharge? No  Dietary orders reviewed? Yes Do you have support at home? Yes   Home Care and Equipment/Supplies: Were home health services ordered? no If so, what is the name of the agency? [employer]  Has the agency set up a time to come to the patient's home? yes Were any new equipment or medical supplies ordered?  No What is the name of the medical supply agency? [employer] to get Motorized wheel chair Were you able to get the supplies/equipment? no Do you have any questions related to the use of the equipment or supplies? No   Awaiting call from [facility].  Functional Questionnaire: (I = Independent and D = Dependent) ADLs: I  Bathing/Dressing- I  Meal Prep- I  Eating- I  Maintaining continence- I  Transferring/Ambulation- I  Managing Meds- I  Follow up appointments reviewed:  PCP Hospital f/u appt confirmed? Yes  Scheduled to see Dr.  Lorin Glass on 11/06/2021 @ 2:30 PM.Dr [facility] f/u appt confirmed? Yes  Scheduled to see Wound Care  on 11/05/2021 @ 1:30 PM. Are transportation arrangements needed? Yes  If their condition worsens, is the pt aware to call PCP or go to the Emergency Dept.? Yes Was the patient provided with contact information for the PCP's office or ED? Yes Was to pt encouraged to call back with questions or concerns? Yes  Sander Nephew, RN 10/29/2021 3:23 PM.

## 2021-10-29 NOTE — Telephone Encounter (Signed)
   Telephone encounter was:  Unsuccessful.  10/29/2021 Name: Jennifer [name] MRN: [id number] DOB: [birth year]  Unsuccessful outbound call made today to assist with:  Transportation Needs  and Food Insecurity  Outreach Attempt:  3rd Attempt.  Referral closed unable to contact patient.  A HIPAA compliant voice message was left requesting a return call.  Instructed patient to call back at Mailbox full send Sms text message to careguide [place] sent .  [place], Care Management  [phone] [address] , [place] [address] Email : [email or web address]

## 2021-10-30 ENCOUNTER — Other Ambulatory Visit: Payer: Self-pay | Admitting: Internal Medicine

## 2021-10-30 ENCOUNTER — Telehealth: Payer: Self-pay | Admitting: *Deleted

## 2021-10-30 DIAGNOSIS — E118 Type 2 diabetes mellitus with unspecified complications: Secondary | ICD-10-CM

## 2021-10-30 NOTE — Progress Notes (Signed)
LINDER, PRAJAPATI ([id number]) Visit Report for 10/29/2021 Chief Complaint Document Details Patient Name: Date of Service: BILLY, ROCCO 10/29/2021 9:00 A M Medical Record Number: [id number] Patient Account Number: [id number] Date of Birth/Sex: Treating RN: 1961/12/10 (60 y.o. [name], Tammi Klippel Primary Care Provider: [name] Other Clinician: Referring Provider: Treating Provider/Extender: [name] in Treatment: 8 Information Obtained from: Patient Chief Complaint 10/29/2021; patient presents for area of skin breakdown to her right lower extremity Electronic Signature(s) Signed: 10/29/2021 10:00:18 AM By: Kalman Shan DO Entered By: Kalman Shan on 10/29/2021 09:50:48 -------------------------------------------------------------------------------- HPI Details Patient Name: Date of Service: Barbette Hair, Tris L. 10/29/2021 9:00 A M Medical Record Number: [id number] Patient Account Number: [id number] Date of Birth/Sex: Treating RN: 1961/12/12 (60 y.o. [name] Primary Care Provider: [name] Other Clinician: Referring Provider: Treating Provider/Extender: [name] in Treatment: 8 History of Present Illness Location: right fifth toe Quality: admits to sharp pain with digital inspection and periwound palpation Duration: healed September 2017, reopened sometime prior to Thanksgiving but unaware until Thanksgiving Timing: pain is primarily with palpation and digital inspection Context: unclear causative factor HPI Description: Admission 10/29/2021 Ms. Heidi Dach is a 60 year old female with a past medical history of insulin-dependent type 2 diabetes, left lower extremity BKA, right partial fifth ray amputation that presents today for a small open wound to her right lower extremity. She was recently hospitalized on 10/24/2021 for acute on chronic HFpEF. She was noted to Have a small open wound with  weeping to her right lower extremity. She was diuresed and also placed in an Haematologist. She reports no issues since discharge. She states that the swelling on her right lower extremity has improved. She currently denies signs of infection. Electronic Signature(s) Signed: 10/29/2021 10:00:18 AM By: Kalman Shan DO Entered By: Kalman Shan on 10/29/2021 09:53:31 -------------------------------------------------------------------------------- Physical Exam Details Patient Name: Date of Service: [name] 10/29/2021 9:00 A M Medical Record Number: [id number] Patient Account Number: [id number] Date of Birth/Sex: Treating RN: [birth year] (60 y.o. [name] Primary Care Provider: [name] Other Clinician: Referring Provider: Treating Provider/Extender: [name] in Treatment: 8 Constitutional respirations regular, non-labored and within target range for patient.. Cardiovascular 2+ dorsalis pedis/posterior tibialis pulses. Psychiatric pleasant and cooperative. Notes Right lower extremity with small open wound with granulation tissue present. Appears almost closed. No drainage noted. 2+ pitting edema to the knee. No signs of surrounding infection. Electronic Signature(s) Signed: 10/29/2021 10:00:18 AM By: Kalman Shan DO Entered By: Kalman Shan on 10/29/2021 09:54:51 -------------------------------------------------------------------------------- Physician Orders Details Patient Name: Date of Service: [name] 10/29/2021 9:00 A M Medical Record Number: [id number] Patient Account Number: [id number] Date of Birth/Sex: Treating RN: January 15, 1961 (60 y.o. Elam Dutch Primary Care Provider: [name] Other Clinician: Referring Provider: Treating Provider/Extender: [name] in Treatment: 8 Verbal / Phone Orders: No Diagnosis Coding ICD-10 Coding Code Description E11.622  Type 2 diabetes mellitus with other skin ulcer I73.9 Peripheral vascular disease, unspecified W80.88 Chronic diastolic (congestive) heart failure Z89.512 Acquired absence of left leg below knee Z89.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) Z79.891 Long term (current) use of opiate analgesic Follow-up Appointments ppointment in 1 week. - Dr. Heber Hawley Return A Other: - bring compression stocking to appointment next week Bathing/ Shower/ Hygiene May shower with protection but do not get wound dressing(s) wet. Edema Control -  Lymphedema / SCD / Other Right Lower Extremity Elevate legs to the level of the heart or above for 30 minutes daily and/or when sitting, a frequency of: - throghout the day Exercise regularly Wound Treatment Wound #23 - Lower Leg Wound Laterality: Right, Anterior Peri-Wound Care: Sween Lotion (Moisturizing lotion) 1 x Per Week/15 Days Discharge Instructions: Apply moisturizing lotion as directed Prim Dressing: KerraCel Ag Gelling Fiber Dressing, 2x2 in (silver alginate) 1 x Per Week/15 Days ary Discharge Instructions: Apply silver alginate to wound bed as instructed Secondary Dressing: Woven Gauze Sponge, Non-Sterile 4x4 in 1 x Per Week/15 Days Discharge Instructions: Apply over primary dressing as directed. Compression Wrap: ThreePress (3 layer compression wrap) 1 x Per Week/15 Days Discharge Instructions: Apply three layer compression as directed. Electronic Signature(s) Signed: 10/29/2021 10:00:18 AM By: Kalman Shan DO Entered By: Kalman Shan on 10/29/2021 09:56:05 -------------------------------------------------------------------------------- Problem List Details Patient Name: Date of Service: [name] 10/29/2021 9:00 A M Medical Record Number: [id number] Patient Account Number: [id number] Date of Birth/Sex: Treating RN: [birth year] (60 y.o. Elam Dutch Primary Care Provider: [name] Other Clinician: Referring  Provider: Treating Provider/Extender: [name] in Treatment: 8 Active Problems ICD-10 Encounter Code Description Active Date MDM Diagnosis E11.622 Type 2 diabetes mellitus with other skin ulcer 09/03/2021 No Yes L97.811 Non-pressure chronic ulcer of other part of right lower leg limited to breakdown 10/29/2021 No Yes of skin I73.9 Peripheral vascular disease, unspecified 09/03/2021 No Yes W09.81 Chronic diastolic (congestive) heart failure 09/03/2021 No Yes Z89.512 Acquired absence of left leg below knee 09/03/2021 No Yes Z89.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) 09/03/2021 No Yes Z79.891 Long term (current) use of opiate analgesic 09/03/2021 No Yes Inactive Problems ICD-10 Code Description Active Date Inactive Date S91.301A Unspecified open wound, right foot, initial encounter 09/03/2021 09/03/2021 Resolved Problems ICD-10 Code Description Active Date Resolved Date E11.621 Type 2 diabetes mellitus with foot ulcer 09/03/2021 09/03/2021 S91.301D Unspecified open wound, right foot, subsequent encounter 09/10/2021 09/10/2021 Electronic Signature(s) Signed: 10/29/2021 10:00:18 AM By: Kalman Shan DO Entered By: Kalman Shan on 10/29/2021 09:58:59 -------------------------------------------------------------------------------- Progress Note Details Patient Name: Date of Service: [name] 10/29/2021 9:00 A M Medical Record Number: [id number] Patient Account Number: [id number] Date of Birth/Sex: Treating RN: 01/01/1961 (60 y.o. [name], Meta.Reding Primary Care Provider: [name] Other Clinician: Referring Provider: Treating Provider/Extender: [name] in Treatment: 8 Subjective Chief Complaint Information obtained from Patient 10/29/2021; patient presents for area of skin breakdown to her right lower extremity History of Present Illness (HPI) The following HPI elements were documented for the patient's  wound: Location: right fifth toe Quality: admits to sharp pain with digital inspection and periwound palpation Duration: healed September 2017, reopened sometime prior to Thanksgiving but unaware until Thanksgiving Timing: pain is primarily with palpation and digital inspection Context: unclear causative factor Admission 10/29/2021 Ms. Heidi Dach is a 60 year old female with a past medical history of insulin-dependent type 2 diabetes, left lower extremity BKA, right partial fifth ray amputation that presents today for a small open wound to her right lower extremity. She was recently hospitalized on 10/24/2021 for acute on chronic HFpEF. She was noted to Have a small open wound with weeping to her right lower extremity. She was diuresed and also placed in an Haematologist. She reports no issues since discharge. She states that the swelling on her right lower extremity has improved. She currently denies signs of infection. Patient  History Information obtained from Patient. Family History Diabetes - Mother, Heart Disease - Mother, Hypertension - Mother, Seizures - Siblings, Stroke - Siblings, No family history of Cancer, Hereditary Spherocytosis, Kidney Disease, Lung Disease, Thyroid Problems, Tuberculosis. Social History Current every day smoker, Marital Status - Divorced, Alcohol Use - Never, Drug Use - No History, Caffeine Use - Moderate. 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 Denies history of Optic Neuritis Ear/Nose/Mouth/Throat Patient has history of Chronic sinus problems/congestion - Flonase Denies history of Middle ear problems Hematologic/Lymphatic Patient has history of Anemia Denies history of Hemophilia, Human Immunodeficiency Virus, Lymphedema, Sickle Cell Disease Respiratory Patient has history of Asthma, Chronic Obstructive Pulmonary Disease (COPD) Denies history of Aspiration Cardiovascular Patient has history of Congestive Heart Failure  - chronic diastolic, Hypertension, Peripheral Arterial Disease, 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 Denies history of Angina, Arrhythmia, Coronary Artery Disease, Deep Vein Thrombosis, Hypotension, Myocardial Infarction, Phlebitis, Vasculitis Gastrointestinal Denies history of Cirrhosis , Colitis, Crohnoos, Hepatitis A, Hepatitis B, Hepatitis C Endocrine Patient has history of Type II Diabetes - uncontrolled w/ side effects Denies history of Type I Diabetes Genitourinary Denies history of End Stage Renal Disease Immunological Denies history of Lupus Erythematosus, Raynaudoos, Scleroderma Integumentary (Skin) Denies history of History of Burn Musculoskeletal Patient has history of Rheumatoid Arthritis, Osteoarthritis - spine, Osteomyelitis - hx left foot Denies history of Gout Neurologic Patient has history of Neuropathy, Seizure Disorder Denies history of Dementia, Quadriplegia, Paraplegia Oncologic Denies history of Received Chemotherapy, Received Radiation Psychiatric Denies history of Anorexia/bulimia, Confinement Anxiety Hospitalization/Surgery History - left leg amputated. - COPD, PNA. 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 Medical A Surgical History Notes nd Constitutional Symptoms (General Health) chronic insomnia , opioid dependence , gait abnormality , severe obesity , s/p BKA , Eyes diabetic retinopathy Ear/Nose/Mouth/Throat oral mucosa lesions Hematologic/Lymphatic thrombocytopenia Cardiovascular hyperlipidemia , infective endocarditis , aortic atherosclerosis , Gastrointestinal GERD Integumentary (Skin) foot ulcer Neurologic childhood seizures Hx Bells Palsy Psychiatric depression Objective Constitutional respirations regular, non-labored and within target range for patient.. Vitals Time Taken: 9:16 AM, Height: 67 in, Source: Stated, Weight: 240 lbs, Source: Stated, BMI: 37.6, Temperature: 97.8 F, Pulse: 71 bpm, Respiratory Rate: 20 breaths/min, Blood  Pressure: 132/82 mmHg, Capillary Blood Glucose: 140 mg/dl. General Notes: glucose per pt report this am Cardiovascular 2+ dorsalis pedis/posterior tibialis pulses. Psychiatric pleasant and cooperative. General Notes: Right lower extremity with small open wound with granulation tissue present. Appears almost closed. No drainage noted. 2+ pitting edema to the knee. No signs of surrounding infection. Integumentary (Hair, Skin) Wound #23 status is Open. Original cause of wound was Skin T ear/Laceration. The date acquired was: 10/24/2021. The wound is located on the Right,Anterior Lower Leg. The wound measures 0.1cm length x 0.1cm width x 0.1cm depth; 0.008cm^2 area and 0.001cm^3 volume. There is Fat Layer (Subcutaneous Tissue) exposed. There is no tunneling or undermining noted. There is a small amount of serosanguineous drainage noted. The wound margin is flat and intact. There is small (1-33%) red granulation within the wound bed. There is no necrotic tissue within the wound bed. 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 She was hospitalized and diuresed with improvement in her symptoms. She also had an Unna boot placed while hospitalized. T oday the wound appears almost healed. I recommended 1 more week of compression therapy with silver alginate. ABI is 0.9. I am hopeful this will be closed at next clinic visit. I recommended she bring her compression stocking to the next visit. No signs of infection  on exam. Procedures Wound #23 Pre-procedure diagnosis of Wound #23 is a Diabetic Wound/Ulcer of the Lower Extremity located on the Right,Anterior Lower Leg . There was a Three Layer Compression Therapy Procedure by Baruch Gouty, RN. Post procedure Diagnosis Wound #23: Same as Pre-Procedure Plan Follow-up Appointments: Return Appointment in 1 week. - Dr. Heber Hammond Other: - bring compression stocking to appointment next week Bathing/ Shower/ Hygiene: May shower with protection but do not get wound dressing(s) wet. Edema Control - Lymphedema / SCD / Other: Elevate legs to the level of the heart or above for 30 minutes daily and/or when sitting, a frequency of: - throghout the day Exercise regularly WOUND #23: - Lower Leg Wound Laterality: Right, Anterior Peri-Wound Care: Sween Lotion (Moisturizing lotion) 1 x Per Week/15 Days Discharge Instructions: Apply moisturizing lotion as directed Prim Dressing: KerraCel Ag Gelling Fiber Dressing, 2x2 in (silver alginate) 1 x Per Week/15 Days ary Discharge Instructions: Apply silver alginate to wound bed as instructed Secondary Dressing: Woven Gauze Sponge, Non-Sterile 4x4 in 1 x Per Week/15 Days Discharge Instructions: Apply over primary dressing as directed. Com pression Wrap: ThreePress (3 layer compression wrap) 1 x Per Week/15 Days Discharge Instructions: Apply three layer compression as directed. 1. Silver alginate with 3 layer compression 2. Follow-up in 1 week Electronic Signature(s) Signed: 10/29/2021 10:00:18 AM By: Kalman Shan DO Entered By: Kalman Shan on 10/29/2021 09:59:25 -------------------------------------------------------------------------------- HxROS Details Patient Name: Date of Service: FA RRA R, Kensy L. 10/29/2021 9:00 A M Medical Record Number: [id number] Patient Account Number: [id number] Date of Birth/Sex: Treating RN: 1961-06-17 (60 y.o. 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 Negative for: Optic Neuritis Past Medical History Notes: diabetic retinopathy Ear/Nose/Mouth/Throat Medical History: Positive for: Chronic sinus problems/congestion - Flonase Negative for: Middle ear problems Past Medical History Notes: oral mucosa lesions Hematologic/Lymphatic Medical History: Positive for: Anemia Negative for: Hemophilia; Human Immunodeficiency Virus; Lymphedema; Sickle Cell Disease Past Medical History Notes: thrombocytopenia Respiratory Medical History: Positive for: Asthma; Chronic Obstructive Pulmonary Disease (COPD) Negative for: Aspiration Cardiovascular Medical History: Positive for: Congestive Heart Failure - chronic diastolic; Hypertension; Peripheral Arterial Disease; 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 Negative for: Angina; Arrhythmia; Coronary Artery Disease; Deep Vein Thrombosis; Hypotension; Myocardial Infarction; Phlebitis; Vasculitis Past Medical History Notes: hyperlipidemia , infective endocarditis , aortic atherosclerosis , Gastrointestinal Medical History: Negative for: Cirrhosis ; Colitis; Crohns; Hepatitis A; Hepatitis B; Hepatitis C Past Medical History Notes: GERD Endocrine Medical History: Positive for: Type II Diabetes - uncontrolled w/ side effects Negative for: Type I Diabetes Time with diabetes: 1993 Treated with: Insulin Blood sugar tested every day: Yes Tested : daily Genitourinary Medical History: Negative for: End Stage Renal Disease Immunological Medical  History: Negative for: Lupus Erythematosus; Raynauds;  Scleroderma Integumentary (Skin) Medical History: Negative for: History of Burn Past Medical History Notes: foot ulcer Musculoskeletal Medical History: Positive for: Rheumatoid Arthritis; Osteoarthritis - spine; Osteomyelitis - hx left foot Negative for: Gout Neurologic Medical History: Positive for: Neuropathy; Seizure Disorder Negative for: Dementia; Quadriplegia; Paraplegia Past Medical History Notes: childhood seizures Hx Bells Palsy Oncologic Medical History: Negative for: Received Chemotherapy; Received Radiation Psychiatric Medical History: Negative for: Anorexia/bulimia; Confinement Anxiety Past Medical History Notes: depression HBO Extended History Items Ear/Nose/Mouth/Throat: Eyes: Eyes: Chronic sinus Cataracts Glaucoma problems/congestion Immunizations Pneumococcal Vaccine: Received Pneumococcal Vaccination: Yes Received Pneumococcal Vaccination On or After 60th Birthday: No Immunization Notes: tetanus shot in 2014 Implantable Devices None Hospitalization / Surgery History Type of Hospitalization/Surgery left leg amputated COPD, 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 Family and Social History Cancer: No; Diabetes: Yes - Mother; Heart Disease: Yes - Mother; Hereditary Spherocytosis: No; Hypertension: Yes - Mother; Kidney Disease: No; Lung Disease: No; Seizures: Yes - Siblings; Stroke: Yes - Siblings; Thyroid Problems: No; Tuberculosis: No; Current every day smoker; Marital Status - Divorced; Alcohol Use: Never; Drug Use: No History; Caffeine Use: Moderate; Financial Concerns: No; Food, Clothing or Shelter Needs: No; Support System Lacking: No; Transportation Concerns: No Electronic Signature(s) Signed: 10/29/2021 10:00:18 AM By: Kalman Shan DO Signed: 10/30/2021 5:29:58 PM By: Deon Pilling RN, BSN Entered By: Kalman Shan on 10/29/2021 09:53:56 -------------------------------------------------------------------------------- [place]  Details Patient Name: Date of Service: [name] 10/29/2021 Medical Record Number: [id number] Patient Account Number: [id number] Date of Birth/Sex: Treating RN: [birth year] (60 y.o. Elam Dutch Primary Care Provider: [name] Other Clinician: Referring Provider: Treating Provider/Extender: [name] in Treatment: 8 Diagnosis Coding ICD-10 Codes Code Description [phone] Type 2 diabetes mellitus with other skin ulcer L97.811 Non-pressure chronic ulcer of other part of right lower leg limited to breakdown of skin I73.9 Peripheral vascular disease, unspecified Q25.95 Chronic diastolic (congestive) heart failure Z89.512 Acquired absence of left leg below knee Z89.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) Z79.891 Long term (current) use of opiate analgesic Facility Procedures CPT4 Code: [id number] Description: [id number] - WOUND CARE VISIT-LEV 2 EST PT Modifier: 25 Quantity: 1 CPT4 Code: [id number] Description: (Facility Use Only) [phone] - APPLY [facility] LWR RT LEG Modifier: Quantity: 1 Physician Procedures : CPT4 Code Description Modifier [id number] - WC PHYS LEVEL 3 - EST PT ICD-10 Diagnosis Description E11.622 Type 2 diabetes mellitus with other skin ulcer L97.811 Non-pressure chronic ulcer of other part of right lower leg limited to breakdown of skin  R42.70 Chronic diastolic (congestive) heart failure Quantity: 1 Electronic Signature(s) Signed: 10/29/2021 10:00:18 AM By: Kalman Shan DO Entered By: Kalman Shan on 10/29/2021 09:59:47

## 2021-10-30 NOTE — Progress Notes (Signed)
Jennifer, [name] ([id number]) Visit Report for 10/29/2021 Arrival Information Details Patient Name: Date of Service: Jennifer [name], Jennifer [name] 10/29/2021 9:00 A M Medical Record Number: [id number] Patient Account Number: [id number] Date of Birth/Sex: Treating RN: Dec 31, 1960 (60 y.o. Elam Dutch Primary Care [name]: [name] Other Clinician: Referring Trilby Way: Treating Denai Caba/Extender: [name] in Treatment: 8 Visit Information History Since Last Visit Added or deleted any medications: No Patient Arrived: Wheel Chair Any new allergies or adverse reactions: No Arrival Time: 09:14 Had a fall or experienced change in No Accompanied By: self activities of daily living that may affect Transfer Assistance: Manual risk of falls: Patient Identification Verified: Yes Signs or symptoms of abuse/neglect since last visito No Secondary Verification Process Completed: Yes Hospitalized since last visit: Yes Patient Requires Transmission-Based Precautions: No Implantable device outside of the clinic excluding No Patient Has Alerts: No cellular tissue based products placed in the center since last visit: Has Dressing in Place as Prescribed: Yes Pain Present Now: Yes Electronic Signature(s) Signed: 10/29/2021 5:12:04 PM By: Baruch Gouty RN, BSN Entered By: Baruch Gouty on 10/29/2021 09:15:52 -------------------------------------------------------------------------------- Clinic Level of Care Assessment Details Patient Name: Date of Service: Jennifer [name], [name] 10/29/2021 9:00 A M Medical Record Number: [id number] Patient Account Number: [id number] Date of Birth/Sex: Treating RN: [birth year] (60 y.o. Elam Dutch Primary Care Jennifer [name]: [name] Other Clinician: Referring Jennifer [name]: Treating Jennifer [name]/Extender: [name] in Treatment: 8 Clinic Level of Care Assessment Items TOOL 1 Quantity Score []  -  0 Use when EandM and Procedure is performed on INITIAL visit ASSESSMENTS - Nursing Assessment / Reassessment X- 1 20 General Physical Exam (combine w/ comprehensive assessment (listed just below) when performed on new pt. evals) X- 1 25 Comprehensive Assessment (HX, ROS, Risk Assessments, Wounds Hx, etc.) ASSESSMENTS - Wound and Skin Assessment / Reassessment []  - 0 Dermatologic / Skin Assessment (not related to wound area) ASSESSMENTS - Ostomy and/or Continence Assessment and Care []  - 0 Incontinence Assessment and Management []  - 0 Ostomy Care Assessment and Management (repouching, etc.) PROCESS - Coordination of Care X - Simple Patient / Family Education for ongoing care 1 15 []  - 0 Complex (extensive) Patient / Family Education for ongoing care X- 1 10 Staff obtains Programmer, systems, Records, T Results / Process Orders est []  - 0 Staff telephones HHA, Nursing Homes / Clarify orders / etc []  - 0 Routine Transfer to another Facility (non-emergent condition) []  - 0 Routine Hospital Admission (non-emergent condition) []  - 0 New Admissions / Biomedical engineer / Ordering NPWT Apligraf, etc. , []  - 0 Emergency Hospital Admission (emergent condition) PROCESS - Special Needs []  - 0 Pediatric / Minor Patient Management []  - 0 Isolation Patient Management []  - 0 Hearing / Language / Visual special needs []  - 0 Assessment of Community assistance (transportation, D/C planning, etc.) []  - 0 Additional assistance / Altered mentation []  - 0 Support Surface(s) Assessment (bed, cushion, seat, etc.) INTERVENTIONS - Miscellaneous []  - 0 External ear exam []  - 0 Patient Transfer (multiple staff / Civil Service fast streamer / Similar devices) []  - 0 Simple Staple / Suture removal (25 or less) []  - 0 Complex Staple / Suture removal (26 or more) []  - 0 Hypo/Hyperglycemic Management (do not check if billed separately) []  - 0 Ankle / Brachial Index (ABI) - do not check if billed separately Has  the patient been seen at the hospital within the last three years: Yes Total  Score: 70 Level Of Care: New/Established - Level 2 Electronic Signature(s) Signed: 10/29/2021 5:12:04 PM By: Baruch Gouty RN, BSN Entered By: Baruch Gouty on 10/29/2021 09:47:38 -------------------------------------------------------------------------------- Compression Therapy Details Patient Name: Date of Service: Jennifer [name] Reader L. 10/29/2021 9:00 A M Medical Record Number: [id number] Patient Account Number: [id number] Date of Birth/Sex: Treating RN: [birth year] (61 y.o. Elam Dutch Primary Care Jennifer [name]: [name] Other Clinician: Referring [name]: Treating Jennifer [name]/Extender: [name] in Treatment: 8 Compression Therapy Performed for Wound Assessment: Wound #23 [place] Lower Leg Performed By: Clinician Baruch Gouty, RN Compression Type: Three Layer Post Procedure Diagnosis Same as Pre-procedure Electronic Signature(s) Signed: 10/29/2021 5:12:04 PM By: Baruch Gouty RN, BSN Entered By: Baruch Gouty on 10/29/2021 09:43:53 -------------------------------------------------------------------------------- Encounter Discharge Information Details Patient Name: Date of Service: Jennifer [name] Reader L. 10/29/2021 9:00 A M Medical Record Number: [id number] Patient Account Number: [id number] Date of Birth/Sex: Treating RN: 1961/10/01 (60 y.o. Elam Dutch Primary Care Jennifer [name]: [name] Other Clinician: Referring Jennifer [name]: Treating Jennifer [name]/Extender: [name] in Treatment: 8 Encounter Discharge Information Items Discharge Condition: Stable Ambulatory Status: Wheelchair Discharge Destination: Home Transportation: Other Accompanied By: self Schedule Follow-up Appointment: Yes Clinical Summary of Care: Patient Declined Notes transportation service Electronic Signature(s) Signed: 10/29/2021 5:12:04 PM  By: Baruch Gouty RN, BSN Entered By: Baruch Gouty on 10/29/2021 10:08:35 -------------------------------------------------------------------------------- Lower Extremity Assessment Details Patient Name: Date of Service: FA RRA Jennifer [name] L. 10/29/2021 9:00 A M Medical Record Number: [id number] Patient Account Number: [id number] Date of Birth/Sex: Treating RN: [birth year] (60 y.o. Elam Dutch Primary Care Dillon Mcreynolds: [name] Other Clinician: Referring Nona Gracey: Treating Simora Dingee/Extender: [name] in Treatment: 8 Edema Assessment Assessed: Shirlyn Goltz: No] [Right: No] Edema: [Left: Ye] [Right: s] Calf Left: Right: Point of Measurement: 33 cm From Medial Instep 44.3 cm Ankle Left: Right: Point of Measurement: 9 cm From Medial Instep 33 cm Knee To Floor Left: Right: From Medial Instep 41 cm Vascular Assessment Pulses: Dorsalis Pedis Palpable: [Right:Yes] Electronic Signature(s) Signed: 10/29/2021 5:12:04 PM By: Baruch Gouty RN, BSN Entered By: Baruch Gouty on 10/29/2021 09:21:18 -------------------------------------------------------------------------------- Multi Wound Chart Details Patient Name: Date of Service: Jennifer [name] Reader L. 10/29/2021 9:00 A M Medical Record Number: [id number] Patient Account Number: [id number] Date of Birth/Sex: Treating RN: 1961/03/10 (60 y.o. [name], Meta.Reding Primary Care Charee Tumblin: [name] Other Clinician: Referring Aydon Swamy: Treating Tailey Top/Extender: [name] in Treatment: 8 Vital Signs Height(in): 49 Capillary Blood Glucose(mg/dl): 140 Weight(lbs): 240 Pulse(bpm): 22 Body Mass Index(BMI): 54 Blood Pressure(mmHg): 132/82 Temperature(F): 97.8 Respiratory Rate(breaths/min): 20 Photos: [N/A:N/A] Right, Anterior Lower Leg N/A N/A Wound Location: Skin T ear/Laceration N/A N/A Wounding Event: Diabetic Wound/Ulcer of the Lower N/A N/A Primary  Etiology: Extremity Skin T ear N/A N/A Secondary Etiology: Cataracts, Glaucoma, Chronic sinus N/A N/A Comorbid History: problems/congestion, Anemia, Asthma, Chronic Obstructive Pulmonary Disease (COPD), Congestive Heart Failure, Hypertension, Peripheral Arterial Disease, Peripheral Venous Disease, Type II Diabetes, Rheumatoid Arthritis, Osteoarthritis, Osteomyelitis, Neuropathy, Seizure Disorder 10/24/2021 N/A N/A Date Acquired: 0 N/A N/A Weeks of Treatment: Open N/A N/A Wound Status: 0.1x0.1x0.1 N/A N/A Measurements L x W x D (cm) 0.008 N/A N/A A (cm) : rea 0.001 N/A N/A Volume (cm) : 0.00% N/A N/A % Reduction in A rea: 0.00% N/A N/A % Reduction in Volume: Grade 1 N/A N/A Classification: Small N/A N/A Exudate A mount: Serosanguineous N/A N/A Exudate Type: red, brown N/A N/A Exudate  Color: Flat and Intact N/A N/A Wound Margin: Small (1-33%) N/A N/A Granulation A mount: Red N/A N/A Granulation Quality: None Present (0%) N/A N/A Necrotic A mount: Fat Layer (Subcutaneous Tissue): Yes N/A N/A Exposed Structures: Fascia: No Tendon: No Muscle: No Joint: No Bone: No Large (67-100%) N/A N/A Epithelialization: Compression Therapy N/A N/A Procedures Performed: Treatment Notes Electronic Signature(s) Signed: 10/29/2021 10:00:18 AM By: Kalman Shan DO Signed: 10/30/2021 5:29:58 PM By: Deon Pilling RN, BSN Entered By: Kalman Shan on 10/29/2021 09:49:40 -------------------------------------------------------------------------------- Multi-Disciplinary Care Plan Details Patient Name: Date of Service: Jennifer [name] Reader L. 10/29/2021 9:00 A M Medical Record Number: [id number] Patient Account Number: [id number] Date of Birth/Sex: Treating RN: [birth year] (60 y.o. Elam Dutch Primary Care Haizlee Henton: [name] Other Clinician: Referring Haleem Hanner: Treating Donnisha Besecker/Extender: [name] in Treatment:  Samoset reviewed with physician Active Inactive Nutrition Nursing Diagnoses: Impaired glucose control: actual or potential Potential for alteratiion in Nutrition/Potential for imbalanced nutrition Goals: Patient/caregiver will maintain therapeutic glucose control Date Initiated: 10/29/2021 Target Resolution Date: 11/26/2021 Goal Status: Active Interventions: Assess patient nutrition upon admission and as needed per policy Provide education on elevated blood sugars and impact on wound healing Treatment Activities: Dietary management education, guidance and counseling : 10/29/2021 Giving encouragement to exercise : 10/29/2021 Patient referred to Primary Care Physician for further nutritional evaluation : 10/29/2021 Notes: Wound/Skin Impairment Nursing Diagnoses: Knowledge deficit related to ulceration/compromised skin integrity Goals: Patient/caregiver will verbalize understanding of skin care regimen Date Initiated: 09/03/2021 Target Resolution Date: 11/26/2021 Goal Status: Active Interventions: Assess patient/caregiver ability to perform ulcer/skin care regimen upon admission and as needed Assess ulceration(s) every visit Provide education on ulcer and skin care Treatment Activities: Skin care regimen initiated : 09/03/2021 Topical wound management initiated : 09/03/2021 Notes: Electronic Signature(s) Signed: 10/29/2021 5:12:04 PM By: Baruch Gouty RN, BSN Entered By: Baruch Gouty on 10/29/2021 09:26:39 -------------------------------------------------------------------------------- Pain Assessment Details Patient Name: Date of Service: Jennifer [name] Reader L. 10/29/2021 9:00 A M Medical Record Number: [id number] Patient Account Number: [id number] Date of Birth/Sex: Treating RN: [birth year] (60 y.o. Elam Dutch Primary Care Calea Hribar: [name] Other Clinician: Referring Antwaine Boomhower: Treating Ferry Matthis/Extender: [name] in Treatment: 8 Active Problems Location of Pain Severity and Description of Pain Patient Has Paino Yes Site Locations Pain Location: Pain in Ulcers With Dressing Change: Yes Duration of the Pain. Constant / Intermittento Constant Rate the pain. Current Pain Level: 8 Worst Pain Level: 9 Least Pain Level: 8 Character of Pain Describe the Pain: Aching Pain Management and Medication Current Pain Management: Medication: Yes Is the Current Pain Management Adequate: Adequate How does your wound impact your activities of daily livingo Sleep: Yes Bathing: No Appetite: No Relationship With Others: No Bladder Continence: No Emotions: Yes Bowel Continence: No Hobbies: No Toileting: No Dressing: No Electronic Signature(s) Signed: 10/29/2021 5:12:04 PM By: Baruch Gouty RN, BSN Entered By: Baruch Gouty on 10/29/2021 09:19:07 -------------------------------------------------------------------------------- Patient/Caregiver Education Details Patient Name: Date of Service: Jennifer [name] Sabal 11/8/2022andnbsp9:00 A M Medical Record Number: [id number] Patient Account Number: [id number] Date of Birth/Gender: Treating RN: [birth year] (60 y.o. Elam Dutch Primary Care Physician: [name] Other Clinician: Referring Physician: Treating Physician/Extender: [name] in Treatment: 8 Education Assessment Education Provided To: Patient Education Topics Provided Venous: Methods: Explain/Verbal Responses: Reinforcements needed, State content correctly Wound/Skin Impairment: Methods: Explain/Verbal Responses: Reinforcements needed, State content correctly Electronic Signature(s) Signed: 10/29/2021 5:12:04 PM  By: Baruch Gouty RN, BSN Entered By: Baruch Gouty on 10/29/2021 09:27:09 -------------------------------------------------------------------------------- Wound Assessment Details Patient Name: Date of Service: FA  RRA Jennifer [name], HAYNE. 10/29/2021 9:00 A M Medical Record Number: [id number] Patient Account Number: [id number] Date of Birth/Sex: Treating RN: 02-12-1961 (60 y.o. Martyn Malay, Vaughan Basta Primary Care Koree Schopf: [name] Other Clinician: Referring Kayron Hicklin: Treating Reisha Wos/Extender: [name] in Treatment: 8 Wound Status Wound Number: 23 Primary Diabetic Wound/Ulcer of the Lower Extremity Etiology: Wound Location: Right, Anterior Lower Leg Secondary Skin Tear Wounding Event: Skin Tear/Laceration Etiology: Date Acquired: 10/24/2021 Wound Open Weeks Of Treatment: 0 Status: Clustered Wound: No Comorbid Cataracts, Glaucoma, Chronic sinus problems/congestion, Anemia, History: Asthma, Chronic Obstructive Pulmonary Disease (COPD), Congestive Heart Failure, Hypertension, Peripheral Arterial Disease, Peripheral Venous Disease, Type II Diabetes, Rheumatoid Arthritis, Osteoarthritis, Osteomyelitis, Neuropathy, Seizure Disorder Photos Wound Measurements Length: (cm) 0.1 Width: (cm) 0.1 Depth: (cm) 0.1 Area: (cm) 0.008 Volume: (cm) 0.001 Wound Description Classification: Grade 1 Wound Margin: Flat and Intact Exudate Amount: Small Exudate Type: Serosanguineous Exudate Color: red, brown Foul Odor After Cleansing: Slough/Fibrino % Reduction in Area: 0% % Reduction in Volume: 0% Epithelialization: Large (67-100%) Tunneling: No Undermining: No No No Wound Bed Granulation Amount: Small (1-33%) Exposed Structure Granulation Quality: Red Fascia Exposed: No Necrotic Amount: None Present (0%) Fat Layer (Subcutaneous Tissue) Exposed: Yes Tendon Exposed: No Muscle Exposed: No Joint Exposed: No Bone Exposed: No Treatment Notes Wound #23 (Lower Leg) Wound Laterality: Right, Anterior Cleanser Peri-Wound Care Sween Lotion (Moisturizing lotion) Discharge Instruction: Apply moisturizing lotion as directed Topical Primary Dressing KerraCel Ag Gelling  Fiber Dressing, 2x2 in (silver alginate) Discharge Instruction: Apply silver alginate to wound bed as instructed Secondary Dressing Woven Gauze Sponge, Non-Sterile 4x4 in Discharge Instruction: Apply over primary dressing as directed. Secured With Compression Wrap ThreePress (3 layer compression wrap) Discharge Instruction: Apply three layer compression as directed. Compression Stockings Add-Ons Electronic Signature(s) Signed: 10/29/2021 5:12:04 PM By: Baruch Gouty RN, BSN Signed: 10/30/2021 5:29:58 PM By: Deon Pilling RN, BSN Entered By: Deon Pilling on 10/29/2021 09:25:31 -------------------------------------------------------------------------------- Vitals Details Patient Name: Date of Service: Jennifer [name] Reader L. 10/29/2021 9:00 A M Medical Record Number: [id number] Patient Account Number: [id number] Date of Birth/Sex: Treating RN: 05/15/1961 (60 y.o. Elam Dutch Primary Care Katye Valek: [name] Other Clinician: Referring Dub Maclellan: Treating Tyjuan Demetro/Extender: [name] in Treatment: 8 Vital Signs Time Taken: 09:16 Temperature (F): 97.8 Height (in): 67 Pulse (bpm): 71 Source: Stated Respiratory Rate (breaths/min): 20 Weight (lbs): 240 Blood Pressure (mmHg): 132/82 Source: Stated Capillary Blood Glucose (mg/dl): 140 Body Mass Index (BMI): 37.6 Reference Range: 80 - 120 mg / dl Notes glucose per pt report this am Electronic Signature(s) Signed: 10/29/2021 5:12:04 PM By: Baruch Gouty RN, BSN Entered By: Baruch Gouty on 10/29/2021 09:16:59

## 2021-10-30 NOTE — Telephone Encounter (Signed)
   Telephone encounter was:  Unsuccessful.  10/30/2021 Name: Jennifer [name] MRN: [id number] DOB: 1961/04/14  Unsuccessful outbound call made today to assist with:  Transportation Needs  and Food Insecurity  Outreach Attempt:  3rd Attempt.  Referral closed unable to contact patient.  voice mail is full sent a sms notification   Sharon, Care Management  [phone] [address] , [place] [address] Email : [email or web address]

## 2021-11-01 ENCOUNTER — Other Ambulatory Visit: Payer: Self-pay

## 2021-11-01 MED ORDER — OXYCODONE-ACETAMINOPHEN 5-325 MG PO TABS: 1.0000 | ORAL_TABLET | Freq: Three times a day (TID) | ORAL | 0 refills | Status: DC | PRN

## 2021-11-01 NOTE — Telephone Encounter (Signed)
oxyCODONE-acetaminophen (PERCOCET/ROXICET) 5-325 MG tablet, REFILL REQUEST @ [id number] - [place], [place] - [address]Marland Kitchen

## 2021-11-05 ENCOUNTER — Encounter (HOSPITAL_BASED_OUTPATIENT_CLINIC_OR_DEPARTMENT_OTHER): Payer: Medicare Other | Admitting: Internal Medicine

## 2021-11-05 ENCOUNTER — Other Ambulatory Visit: Payer: Self-pay

## 2021-11-05 DIAGNOSIS — E11621 Type 2 diabetes mellitus with foot ulcer: Secondary | ICD-10-CM

## 2021-11-05 DIAGNOSIS — E11622 Type 2 diabetes mellitus with other skin ulcer: Secondary | ICD-10-CM | POA: Diagnosis not present

## 2021-11-05 DIAGNOSIS — L97522 Non-pressure chronic ulcer of other part of left foot with fat layer exposed: Secondary | ICD-10-CM

## 2021-11-05 DIAGNOSIS — I87312 Chronic venous hypertension (idiopathic) with ulcer of left lower extremity: Secondary | ICD-10-CM | POA: Diagnosis not present

## 2021-11-05 DIAGNOSIS — I890 Lymphedema, not elsewhere classified: Secondary | ICD-10-CM

## 2021-11-05 NOTE — Progress Notes (Signed)
Jennifer, [name] ([id number]) Visit Report for 11/05/2021 Chief Complaint Document Details Patient Name: Date of Service: Jennifer [name], Jennifer [name] 11/05/2021 1:30 PM Medical Record Number: [id number] Patient Account Number: [id number] Date of Birth/Sex: Treating RN: Aug 27, 1961 (60 y.o. F) Primary Care Provider: [name] Other Clinician: Referring Provider: Treating Provider/Extender: [name] in Treatment: 9 Information Obtained from: Patient Chief Complaint 10/29/2021; patient presents for area of skin breakdown to her right lower extremity Electronic Signature(s) Signed: 11/05/2021 4:41:13 PM By: Kalman Shan DO Entered By: Kalman Shan on 11/05/2021 14:20:48 -------------------------------------------------------------------------------- HPI Details Patient Name: Date of Service: Jennifer Reader L. 11/05/2021 1:30 PM Medical Record Number: [id number] Patient Account Number: [id number] Date of Birth/Sex: Treating RN: March 01, 1961 (60 y.o. F) Primary Care Provider: [name] Other Clinician: Referring Provider: Treating Provider/Extender: [name] in Treatment: 9 History of Present Illness HPI Description: Admission 10/29/2021 Ms. Kamera Dubas is a 60 year old female with a past medical history of insulin-dependent type 2 diabetes, left lower extremity BKA, right partial fifth ray amputation that presents today for a small open wound to her right lower extremity. She was recently hospitalized on 10/24/2021 for acute on chronic HFpEF. She was noted to Have a small open wound with weeping to her right lower extremity. She was diuresed and also placed in an Haematologist. She reports no issues since discharge. She states that the swelling on her right lower extremity has improved. She currently denies signs of infection. 11/15; patient presents for follow-up. She has no issues or complaints today. She reports her wound  is healed and has her compression stocking today. Electronic Signature(s) Signed: 11/05/2021 4:41:13 PM By: Kalman Shan DO Entered By: Kalman Shan on 11/05/2021 14:21:28 -------------------------------------------------------------------------------- Physician Orders Details Patient Name: Date of Service: Jennifer Reader L. 11/05/2021 1:30 PM Medical Record Number: [id number] Patient Account Number: [id number] Date of Birth/Sex: Treating RN: 01-20-1961 (60 y.o. 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 Edema Control - Lymphedema / SCD / Other Right Lower Extremity Elevate legs to the level of the heart or above for 30 minutes daily and/or when sitting, a frequency of: - throghout the day Exercise regularly Moisturize legs daily. Compression stocking or Garment 20-30 mm/Hg pressure to: - to right leg daily Electronic Signature(s) Signed: 11/05/2021 4:41:13 PM By: Kalman Shan DO Signed: 11/05/2021 5:46:53 PM By: Baruch Gouty RN, BSN Entered By: Baruch Gouty on 11/05/2021 14:11:21 -------------------------------------------------------------------------------- Problem List Details Patient Name: Date of Service: Jennifer Reader L. 11/05/2021 1:30 PM Medical Record Number: [id number] Patient Account Number: [id number] Date of Birth/Sex: Treating RN: 17-Oct-1961 (60 y.o. Elam Dutch Primary Care Provider: [name] Other Clinician: Referring Provider: Treating Provider/Extender: [name] in Treatment: 9 Active Problems ICD-10 Encounter Code Description Active Date MDM Diagnosis E11.622 Type 2 diabetes mellitus with other skin ulcer 09/03/2021  No Yes L97.811 Non-pressure chronic ulcer of other part of right lower leg limited to breakdown 10/29/2021 No Yes of skin I73.9 Peripheral vascular disease, unspecified 09/03/2021 No Yes Y63.78 Chronic diastolic (congestive) heart failure 09/03/2021 No Yes Z89.512 Acquired absence of left leg below knee 09/03/2021 No Yes Z89.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) 09/03/2021 No Yes Z79.891 Long term (current) use of opiate analgesic 09/03/2021 No Yes Inactive Problems ICD-10 Code Description Active Date Inactive Date S91.301A Unspecified open wound, right foot, initial encounter 09/03/2021 09/03/2021 Resolved Problems ICD-10 Code Description Active Date Resolved Date E11.621 Type 2 diabetes mellitus with foot ulcer 09/03/2021 09/03/2021 S91.301D Unspecified open wound, right foot, subsequent encounter 09/10/2021 09/10/2021 Electronic Signature(s) Signed: 11/05/2021 4:41:13 PM By: Kalman Shan DO Entered By: Kalman Shan on 11/05/2021 14:19:46 -------------------------------------------------------------------------------- SuperBill Details Patient Name: Date of Service: Jennifer [name]. 11/05/2021 Medical Record Number: [id number] Patient Account Number: [id number] Date of Birth/Sex: Treating RN: [birth year] (60 y.o. 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

## 2021-11-05 NOTE — Progress Notes (Addendum)
Jennifer [name], Jennifer [name] ([id number]) Visit Report for 11/05/2021 Arrival Information Details Patient Name: Date of Service: Jennifer [name], Jennifer [name] 11/05/2021 1:30 PM Medical Record Number: [id number] Patient Account Number: [id number] Date of Birth/Sex: Treating RN: 05-31-1961 (60 y.o. F) Primary Care Bethzy Hauck: [name] Other Clinician: Referring Danamarie Minami: Treating Shadman Tozzi/Extender: [name] in Treatment: 9 Visit Information History Since Last Visit Added or deleted any medications: No Patient Arrived: Wheel Chair Any new allergies or adverse reactions: No Arrival Time: 13:39 Had a fall or experienced change in No Accompanied By: self activities of daily living that may affect Transfer Assistance: Manual risk of falls: Patient Identification Verified: Yes Signs or symptoms of abuse/neglect since last visito No Secondary Verification Process Completed: Yes Hospitalized since last visit: No Patient Requires Transmission-Based Precautions: No Implantable device outside of the clinic excluding No Patient Has Alerts: No cellular tissue based products placed in the center since last visit: Has Dressing in Place as Prescribed: Yes Pain Present Now: Yes Electronic Signature(s) Signed: 11/05/2021 1:54:26 PM By: Sandre Kitty Entered By: Sandre Kitty on 11/05/2021 13:41:58 -------------------------------------------------------------------------------- Clinic Level of Care Assessment Details Patient Name: Date of Service: Jennifer [name] 11/05/2021 1:30 PM Medical Record Number: [id number] Patient Account Number: [id number] Date of Birth/Sex: Treating RN: Mar 06, 1961 (60 y.o. Elam Dutch Primary Care Ariyana Faw: [name] Other Clinician: Referring Reshma Hoey: Treating Tyrus Wilms/Extender: [name] in Treatment: 9 Clinic Level of Care Assessment Items TOOL 4 Quantity Score []  - 0 Use when only an EandM is  performed on FOLLOW-UP visit ASSESSMENTS - Nursing Assessment / Reassessment X- 1 10 Reassessment of Co-morbidities (includes updates in patient status) X- 1 5 Reassessment of Adherence to Treatment Plan ASSESSMENTS - Wound and Skin A ssessment / Reassessment X - Simple Wound Assessment / Reassessment - one wound 1 5 []  - 0 Complex Wound Assessment / Reassessment - multiple wounds []  - 0 Dermatologic / Skin Assessment (not related to wound area) ASSESSMENTS - Focused Assessment X- 1 5 Circumferential Edema Measurements - multi extremities []  - 0 Nutritional Assessment / Counseling / Intervention X- 1 5 Lower Extremity Assessment (monofilament, tuning fork, pulses) []  - 0 Peripheral Arterial Disease Assessment (using hand held doppler) ASSESSMENTS - Ostomy and/or Continence Assessment and Care []  - 0 Incontinence Assessment and Management []  - 0 Ostomy Care Assessment and Management (repouching, etc.) PROCESS - Coordination of Care X - Simple Patient / Family Education for ongoing care 1 15 []  - 0 Complex (extensive) Patient / Family Education for ongoing care X- 1 10 Staff obtains Programmer, systems, Records, T Results / Process Orders est []  - 0 Staff telephones HHA, Nursing Homes / Clarify orders / etc []  - 0 Routine Transfer to another Facility (non-emergent condition) []  - 0 Routine Hospital Admission (non-emergent condition) []  - 0 New Admissions / Biomedical engineer / Ordering NPWT Apligraf, etc. , []  - 0 Emergency Hospital Admission (emergent condition) X- 1 10 Simple Discharge Coordination []  - 0 Complex (extensive) Discharge Coordination PROCESS - Special Needs []  - 0 Pediatric / Minor Patient Management []  - 0 Isolation Patient Management []  - 0 Hearing / Language / Visual special needs []  - 0 Assessment of Community assistance (transportation, D/C planning, etc.) []  - 0 Additional assistance / Altered mentation []  - 0 Support Surface(s) Assessment  (bed, cushion, seat, etc.) INTERVENTIONS - Wound Cleansing / Measurement X - Simple Wound Cleansing - one wound 1 5 []  - 0 Complex Wound Cleansing - multiple wounds X-  1 5 Wound Imaging (photographs - any number of wounds) []  - 0 Wound Tracing (instead of photographs) []  - 0 Simple Wound Measurement - one wound []  - 0 Complex Wound Measurement - multiple wounds INTERVENTIONS - Wound Dressings []  - 0 Small Wound Dressing one or multiple wounds []  - 0 Medium Wound Dressing one or multiple wounds []  - 0 Large Wound Dressing one or multiple wounds []  - 0 Application of Medications - topical []  - 0 Application of Medications - injection INTERVENTIONS - Miscellaneous []  - 0 External ear exam []  - 0 Specimen Collection (cultures, biopsies, blood, body fluids, etc.) []  - 0 Specimen(s) / Culture(s) sent or taken to Lab for analysis []  - 0 Patient Transfer (multiple staff / Civil Service fast streamer / Similar devices) []  - 0 Simple Staple / Suture removal (25 or less) []  - 0 Complex Staple / Suture removal (26 or more) []  - 0 Hypo / Hyperglycemic Management (close monitor of Blood Glucose) []  - 0 Ankle / Brachial Index (ABI) - do not check if billed separately X- 1 5 Vital Signs Has the patient been seen at the hospital within the last three years: Yes Total Score: 80 Level Of Care: New/Established - Level 3 Electronic Signature(s) Signed: 11/05/2021 5:46:53 PM By: Baruch Gouty RN, BSN Entered By: Baruch Gouty on 11/05/2021 14:12:24 -------------------------------------------------------------------------------- Encounter Discharge Information Details Patient Name: Date of Service: Jennifer Reader L. 11/05/2021 1:30 PM Medical Record Number: [id number] Patient Account Number: [id number] Date of Birth/Sex: Treating RN: January 24, 1961 (60 y.o. Elam Dutch Primary Care Dorismar Chay: [name] Other Clinician: Referring Arek Spadafore: Treating Tajon Moring/Extender: [name] in Treatment: 9 Encounter Discharge Information Items Discharge Condition: Stable Ambulatory Status: Wheelchair Discharge Destination: Home Transportation: Private Auto Accompanied By: self Schedule Follow-up Appointment: Yes Clinical Summary of Care: Patient Declined Electronic Signature(s) Signed: 11/05/2021 5:46:53 PM By: Baruch Gouty RN, BSN Entered By: Baruch Gouty on 11/05/2021 14:14:16 -------------------------------------------------------------------------------- Lower Extremity Assessment Details Patient Name: Date of Service: Jennifer [name], RIEDE. 11/05/2021 1:30 PM Medical Record Number: [id number] Patient Account Number: [id number] Date of Birth/Sex: Treating RN: [birth year] (60 y.o. Elam Dutch Primary Care Gunter Conde: [name] Other Clinician: Referring Navpreet Szczygiel: Treating Leldon Steege/Extender: [name] in Treatment: 9 Edema Assessment Assessed: Shirlyn Goltz: No] [Right: No] Edema: [Left: Ye] [Right: s] Calf Left: Right: Point of Measurement: 33 cm From Medial Instep 44 cm Ankle Left: Right: Point of Measurement: 9 cm From Medial Instep 30 cm Vascular Assessment Pulses: Dorsalis Pedis Palpable: [Right:Yes] Electronic Signature(s) Signed: 11/05/2021 5:46:53 PM By: Baruch Gouty RN, BSN Entered By: Baruch Gouty on 11/05/2021 14:01:22 -------------------------------------------------------------------------------- Multi Wound Chart Details Patient Name: Date of Service: Jennifer Reader L. 11/05/2021 1:30 PM Medical Record Number: [id number] Patient Account Number: [id number] Date of Birth/Sex: Treating RN: May 24, 1961 (60 y.o. F) Primary Care Orlandus Borowski: [name] Other Clinician: Referring Johnathon Olden: Treating Gaytha Raybourn/Extender: [name] in Treatment: 9 Vital Signs Height(in): 65 Pulse(bpm): 70 Weight(lbs): 240 Blood Pressure(mmHg): 162/88 Body  Mass Index(BMI): 38 Temperature(F): 97.1 Respiratory Rate(breaths/min): 20 Photos: [N/A:N/A] Right, Anterior Lower Leg N/A N/A Wound Location: Skin T ear/Laceration N/A N/A Wounding Event: Diabetic Wound/Ulcer of the Lower N/A N/A Primary Etiology: Extremity Skin T ear N/A N/A Secondary Etiology: Cataracts, Glaucoma, Chronic sinus N/A N/A Comorbid History: problems/congestion, Anemia, Asthma, Chronic Obstructive Pulmonary Disease (COPD), Congestive Heart Failure, Hypertension, Peripheral Arterial Disease, Peripheral Venous Disease, Type II Diabetes, Rheumatoid Arthritis, Osteoarthritis, Osteomyelitis, Neuropathy, Seizure Disorder 10/24/2021 N/A N/A Date  Acquired: 1 N/A N/A Weeks of Treatment: Open N/A N/A Wound Status: 0x0x0 N/A N/A Measurements L x W x D (cm) 0 N/A N/A A (cm) : rea 0 N/A N/A Volume (cm) : 100.00% N/A N/A % Reduction in A rea: 100.00% N/A N/A % Reduction in Volume: Grade 1 N/A N/A Classification: Small N/A N/A Exudate A mount: Serosanguineous N/A N/A Exudate Type: red, brown N/A N/A Exudate Color: Flat and Intact N/A N/A Wound Margin: Small (1-33%) N/A N/A Granulation A mount: Red N/A N/A Granulation Quality: None Present (0%) N/A N/A Necrotic A mount: Fat Layer (Subcutaneous Tissue): Yes N/A N/A Exposed Structures: Fascia: No Tendon: No Muscle: No Joint: No Bone: No Large (67-100%) N/A N/A Epithelialization: Treatment Notes Wound #23 (Lower Leg) Wound Laterality: Right, Anterior Cleanser Peri-Wound Care Topical Primary Dressing Secondary Dressing Secured With Compression Wrap Compression Stockings Add-Ons Notes compression stocking Electronic Signature(s) Signed: 11/05/2021 4:41:13 PM By: Kalman Shan DO Entered By: Kalman Shan on 11/05/2021 14:20:08 -------------------------------------------------------------------------------- Multi-Disciplinary Care Plan Details Patient Name: Date of Service: Jennifer Reader L. 11/05/2021 1:30 PM Medical Record Number: [id number] Patient Account Number: [id number] Date of Birth/Sex: Treating RN: 06-Dec-1961 (60 y.o. Elam Dutch Primary Care Eeshan Verbrugge: [name] Other Clinician: Referring Cruise Baumgardner: Treating Shavonte Zhao/Extender: [name] in Treatment: [place] reviewed with physician Active Inactive Electronic Signature(s) Signed: 11/05/2021 5:46:53 PM By: Baruch Gouty RN, BSN Entered By: Baruch Gouty on 11/05/2021 14:02:15 -------------------------------------------------------------------------------- Pain Assessment Details Patient Name: Date of Service: Jennifer Reader L. 11/05/2021 1:30 PM Medical Record Number: [id number] Patient Account Number: [id number] Date of Birth/Sex: Treating RN: 06/15/1961 (60 y.o. F) Primary Care Della Scrivener: [name] Other Clinician: Referring Aster Screws: Treating Jami Bogdanski/Extender: [name] in Treatment: 9 Active Problems Location of Pain Severity and Description of Pain Patient Has Paino Yes Site Locations Pain Location: Generalized Pain Rate the pain. Current Pain Level: 8 Pain Management and Medication Current Pain Management: Notes pt states "I hurt everywhere" Electronic Signature(s) Signed: 11/05/2021 5:46:53 PM By: Baruch Gouty RN, BSN Previous Signature: 11/05/2021 1:54:26 PM Version By: Sandre Kitty Entered By: Baruch Gouty on 11/05/2021 13:59:48 -------------------------------------------------------------------------------- Patient/Caregiver Education Details Patient Name: Date of Service: Jennifer [name] 11/15/2022andnbsp1:30 PM Medical Record Number: [id number] Patient Account Number: [id number] Date of Birth/Gender: Treating RN: 22-Apr-1961 (60 y.o. Elam Dutch Primary Care Physician: [name] Other Clinician: Referring Physician: Treating  Physician/Extender: [name] in Treatment: 9 Education Assessment Education Provided To: Patient Education Topics Provided Venous: Methods: Explain/Verbal Responses: Reinforcements needed, State content correctly Wound/Skin Impairment: Methods: Explain/Verbal Responses: Reinforcements needed, State content correctly Electronic Signature(s) Signed: 11/05/2021 5:46:53 PM By: Baruch Gouty RN, BSN Entered By: Baruch Gouty on 11/05/2021 14:02:42 -------------------------------------------------------------------------------- Wound Assessment Details Patient Name: Date of Service: Jennifer Reader L. 11/05/2021 1:30 PM Medical Record Number: [id number] Patient Account Number: [id number] Date of Birth/Sex: Treating RN: [birth year] (60 y.o. F) Primary Care Roisin Mones: [name] Other Clinician: Referring Willie Loy: Treating Eros Montour/Extender: [name] in Treatment: 9 Wound Status Wound Number: 23 Primary Diabetic Wound/Ulcer of the Lower Extremity Etiology: Wound Location: Right, Anterior Lower Leg Secondary Skin Tear Wounding Event: Skin Tear/Laceration Etiology: Date Acquired: 10/24/2021 Wound Open Weeks Of Treatment: 1 Status: Clustered Wound: No Comorbid Cataracts, Glaucoma, Chronic sinus problems/congestion, Anemia, History: Asthma, Chronic Obstructive Pulmonary Disease (COPD), Congestive Heart Failure, Hypertension, Peripheral Arterial Disease, Peripheral Venous Disease, Type II Diabetes, Rheumatoid Arthritis, Osteoarthritis, Osteomyelitis, Neuropathy, Seizure Disorder Photos  Wound Measurements Length: (cm) Width: (cm) Depth: (cm) Area: (cm) Volume: (cm) 0 % Reduction in Area: 100% 0 % Reduction in Volume: 100% 0 Epithelialization: Large (67-100%) 0 0 Wound Description Classification: Grade 1 Wound Margin: Flat and Intact Exudate Amount: Small Exudate Type: Serosanguineous Exudate Color:  red, brown Foul Odor After Cleansing: No Slough/Fibrino No Wound Bed Granulation Amount: Small (1-33%) Exposed Structure Granulation Quality: Red Fascia Exposed: No Necrotic Amount: None Present (0%) Fat Layer (Subcutaneous Tissue) Exposed: Yes Tendon Exposed: No Muscle Exposed: No Joint Exposed: No Bone Exposed: No Electronic Signature(s) Signed: 11/05/2021 1:54:26 PM By: Sandre Kitty Entered By: Sandre Kitty on 11/05/2021 13:47:42 -------------------------------------------------------------------------------- Vitals Details Patient Name: Date of Service: Jennifer Reader L. 11/05/2021 1:30 PM Medical Record Number: [id number] Patient Account Number: [id number] Date of Birth/Sex: Treating RN: 1961/01/01 (59 y.o. F) Primary Care Aundreya Souffrant: [name] Other Clinician: Referring Chaos Carlile: Treating Alycea Segoviano/Extender: [name] in Treatment: 9 Vital Signs Time Taken: 13:42 Temperature (F): 97.1 Height (in): 67 Pulse (bpm): 87 Weight (lbs): 240 Respiratory Rate (breaths/min): 20 Body Mass Index (BMI): 37.6 Blood Pressure (mmHg): 162/88 Reference Range: 80 - 120 mg / dl Electronic Signature(s) Signed: 11/05/2021 1:54:26 PM By: Sandre Kitty Entered By: Sandre Kitty on 11/05/2021 13:42:33

## 2021-11-06 ENCOUNTER — Ambulatory Visit: Payer: Medicare Other | Admitting: Dietician

## 2021-11-06 ENCOUNTER — Encounter: Payer: Medicare Other | Admitting: Internal Medicine

## 2021-11-07 ENCOUNTER — Other Ambulatory Visit: Payer: Self-pay

## 2021-11-07 ENCOUNTER — Telehealth: Payer: Self-pay | Admitting: *Deleted

## 2021-11-07 ENCOUNTER — Institutional Professional Consult (permissible substitution): Payer: Medicare Other | Admitting: Behavioral Health

## 2021-11-07 ENCOUNTER — Emergency Department (HOSPITAL_COMMUNITY)
Admission: EM | Admit: 2021-11-07 | Discharge: 2021-11-08 | Disposition: A | Discharge status: Home / Self Care | Payer: Medicare Other | Attending: Emergency Medicine | Admitting: Emergency Medicine

## 2021-11-07 ENCOUNTER — Emergency Department (HOSPITAL_COMMUNITY): Payer: Medicare Other

## 2021-11-07 ENCOUNTER — Encounter (HOSPITAL_COMMUNITY): Payer: Self-pay | Admitting: Emergency Medicine

## 2021-11-07 DIAGNOSIS — Z5321 Procedure and treatment not carried out due to patient leaving prior to being seen by health care provider: Secondary | ICD-10-CM | POA: Diagnosis not present

## 2021-11-07 DIAGNOSIS — R079 Chest pain, unspecified: Secondary | ICD-10-CM | POA: Diagnosis present

## 2021-11-07 DIAGNOSIS — R55 Syncope and collapse: Secondary | ICD-10-CM | POA: Diagnosis not present

## 2021-11-07 LAB — BASIC METABOLIC PANEL
Anion gap: 10 (ref 5–15)
BUN: 36 mg/dL — ABNORMAL HIGH (ref 8–23)
CO2: 24 mmol/L (ref 22–32)
Calcium: 9 mg/dL (ref 8.9–10.3)
Chloride: 105 mmol/L (ref 98–111)
Creatinine, Ser: 1.37 mg/dL — ABNORMAL HIGH (ref 0.44–1.00)
GFR, Estimated: 44 mL/min — ABNORMAL LOW (ref >60)
Glucose, Bld: 124 mg/dL — ABNORMAL HIGH (ref 70–99)
Potassium: 3.9 mmol/L (ref 3.5–5.1)
Sodium: 139 mmol/L (ref 135–145)

## 2021-11-07 LAB — CBC
HCT: 40.6 % (ref 36.0–46.0)
Hemoglobin: 13.1 g/dL (ref 12.0–15.0)
MCH: 31.3 pg (ref 26.0–34.0)
MCHC: 32.3 g/dL (ref 30.0–36.0)
MCV: 97.1 fL (ref 80.0–100.0)
Platelets: 160 10*3/uL (ref 150–400)
RBC: 4.18 MIL/uL (ref 3.87–5.11)
RDW: 14.7 % (ref 11.5–15.5)
WBC: 9.1 10*3/uL (ref 4.0–10.5)
nRBC: 0 % (ref 0.0–0.2)

## 2021-11-07 LAB — TROPONIN I (HIGH SENSITIVITY)
Troponin I (High Sensitivity): 4 ng/L (ref <18)
Troponin I (High Sensitivity): 4 ng/L (ref <18)

## 2021-11-07 NOTE — ED Provider Notes (Signed)
Emergency Medicine Provider Triage Evaluation Note  Jennifer [name] , a 60 y.o. female  was evaluated in triage.  Pt complains of chest pain and syncopal episodes.  Review of Systems  Positive: Chest pain, syncope Negative: so  Physical Exam  BP 134/88 (BP Location: Right Arm)   Pulse 74   Temp 97.9 F (36.6 C)   Resp 18   SpO2 99%  Gen:   Awake, no distress   Resp:  Normal effort  MSK:   Moves extremities without difficulty  Other:  Clear speech  Medical Decision Making  Medically screening exam initiated at 3:30 PM.  Appropriate orders placed.  Jennifer [name] was informed that the remainder of the evaluation will be completed by another provider, this initial triage assessment does not replace that evaluation, and the importance of remaining in the ED until their evaluation is complete.     Bishop Dublin 11/07/21 1530    Truddie Hidden, MD 11/07/21 [phone]

## 2021-11-07 NOTE — Telephone Encounter (Signed)
Patient called in to cancel appt with [facility] and was transferred to [facility]. States "I've been passing out all night." At present patient states, "I feel dizzy, I don't feel right." Also, reports she feels like something is caught in her throat. Had been eating cheese crackers and turtle candy. Patient advised to call 911. States she will but wants to wait till son, Jennifer [name], gets home from Dr.'s appt. Advised against waiting, and offered to call 911 on her behalf. She declined, states she's all alone and will call as soon as son gets home. States she is currently sitting up in bed and is afraid to get up for fear of passing out again. States she will call before son arrives if she starts to feel worse.

## 2021-11-07 NOTE — ED Triage Notes (Addendum)
Pt presents to ED [facility] from home. Pt c/o fatigue since Monday. Pt reports that she felt like she kept passing out today but EMS reports she was falling asleep. Pt reports that she used to take meds to help her sleep but since her mother passed away she was afraid to take it  EMS VS: 156/80 HR - 72 RR - 18 100% RA CBG - 154

## 2021-11-07 NOTE — ED Notes (Signed)
Pt stated her right side was starting to hurt worse

## 2021-11-08 ENCOUNTER — Telehealth: Payer: Self-pay | Admitting: *Deleted

## 2021-11-08 NOTE — ED Notes (Signed)
Pt left due to wait time  

## 2021-11-08 NOTE — Telephone Encounter (Signed)
Patient requested return call. States she left ED at 0200 as she had not been seen by Provider. She is feeling much better today. No more episodes of feeling like passing out. She has upcoming appt with Resident on 11/22 at 1415. Will move appt with [name] from [place] to 1515 that same day.

## 2021-11-11 ENCOUNTER — Other Ambulatory Visit: Payer: Self-pay

## 2021-11-11 ENCOUNTER — Ambulatory Visit: Payer: Medicare Other

## 2021-11-11 ENCOUNTER — Other Ambulatory Visit: Payer: Self-pay | Admitting: Internal Medicine

## 2021-11-11 DIAGNOSIS — E0843 Diabetes mellitus due to underlying condition with diabetic autonomic (poly)neuropathy: Secondary | ICD-10-CM

## 2021-11-11 NOTE — Progress Notes (Signed)
SITUATION Reason for Visit: Fitting of Diabetic Shoes & Insoles Patient / Caregiver Report:  Patient understands why the shoe she selected are inappropriate for her.  OBJECTIVE DATA: Patient History / Diagnosis:  Diabetes Mellitus with history of amputation Change in Status:   None  ACTIONS PERFORMED: In-Person Delivery, patient was attempted to be fit with: - 1x pair [id number] PDAC approved prefabricated Diabetic Shoes: Apex A403W sneakers - 3x pair [id number] PDAC approved CAM milled custom diabetic insoles  Attempted to fit shoes and insoles to patient. Shoes were inappropriately narrow and foot orthotics did not match patient's footbed. Recasted patient via crush box and measured for [place] 628-E size 9.5-4E  PLAN Patient to return in four weeks for fitting of medically appropriate shoes. Plan of care was discussed with and agreed upon by patient and/or caregiver. All questions were answered and concerns addressed.

## 2021-11-11 NOTE — Progress Notes (Signed)
ADDENDUM: Confirmation number from [employer] [phone]

## 2021-11-12 ENCOUNTER — Encounter: Payer: Self-pay | Admitting: Internal Medicine

## 2021-11-12 ENCOUNTER — Other Ambulatory Visit: Payer: Self-pay | Admitting: Student in an Organized Health Care Education/Training Program

## 2021-11-12 ENCOUNTER — Ambulatory Visit (INDEPENDENT_AMBULATORY_CARE_PROVIDER_SITE_OTHER): Payer: Medicare Other | Admitting: Dietician

## 2021-11-12 ENCOUNTER — Ambulatory Visit (INDEPENDENT_AMBULATORY_CARE_PROVIDER_SITE_OTHER): Payer: Medicare Other | Admitting: Internal Medicine

## 2021-11-12 ENCOUNTER — Encounter: Payer: Self-pay | Admitting: Dietician

## 2021-11-12 ENCOUNTER — Other Ambulatory Visit: Payer: Self-pay

## 2021-11-12 DIAGNOSIS — E119 Type 2 diabetes mellitus without complications: Secondary | ICD-10-CM

## 2021-11-12 DIAGNOSIS — E118 Type 2 diabetes mellitus with unspecified complications: Secondary | ICD-10-CM | POA: Diagnosis not present

## 2021-11-12 DIAGNOSIS — Z794 Long term (current) use of insulin: Secondary | ICD-10-CM

## 2021-11-12 DIAGNOSIS — I152 Hypertension secondary to endocrine disorders: Secondary | ICD-10-CM

## 2021-11-12 DIAGNOSIS — E1159 Type 2 diabetes mellitus with other circulatory complications: Secondary | ICD-10-CM

## 2021-11-12 DIAGNOSIS — I5033 Acute on chronic diastolic (congestive) heart failure: Secondary | ICD-10-CM | POA: Diagnosis not present

## 2021-11-12 DIAGNOSIS — N3946 Mixed incontinence: Secondary | ICD-10-CM

## 2021-11-12 MED ORDER — PREGABALIN 200 MG PO CAPS: ORAL_CAPSULE | ORAL | 2 refills | Status: DC

## 2021-11-12 MED ORDER — HYDROCHLOROTHIAZIDE 25 MG PO TABS: 25.0000 mg | ORAL_TABLET | Freq: Every day | ORAL | 0 refills | Status: DC

## 2021-11-12 MED ORDER — TRESIBA FLEXTOUCH 200 UNIT/ML ~~LOC~~ SOPN: 40.0000 [IU] | PEN_INJECTOR | Freq: Every day | SUBCUTANEOUS | 8 refills | Status: DC

## 2021-11-12 MED ORDER — TRESIBA FLEXTOUCH 200 UNIT/ML ~~LOC~~ SOPN: 38.0000 [IU] | PEN_INJECTOR | Freq: Every day | SUBCUTANEOUS | 8 refills | Status: DC

## 2021-11-12 NOTE — Assessment & Plan Note (Addendum)
Patient is on metformin 500 mg twice daily, Tresiba 40 units nightly.  She was also restarted on Ozempic during her last clinic visit. The patient has not been measuring her blood sugars since the beginning of this month.  However, AGP report from last month shows that she has been in target range 70% of the time.  She does, however, have 4% of her readings in the low range.  The patient says that she has not made any changes to her medication regimen.  P: Decrease Tresiba to 38 units nightly given concern for some low sugar readings.  Patient to see Jed Limerick this afternoon to discuss freestyle libre 3.

## 2021-11-12 NOTE — Progress Notes (Addendum)
Medical Nutrition Therapy Start time: 4132  End time: 1545  Assessment:  Ms. Haapala presents today for assistance with Continuous glucose monitoring. She was provided a sample sensor at her last visit because she forgot to bring her sensor. She was scheduled today to apply a new sensor and to set up her reader, however she forgot to bring those items again today. She was provided with second sample sensor and successfully applied and started it at today's visit. Today's CGM report showed 2 60 minute episodes of nocturnal hypoglycemia in October. Her CGM report stopped 10/29/21 and she did not use her meter to self monitor her diabetes/blood sugars in the meantime.  Patient reports that she was aware of these and had symptoms.  Lab Results  Component Value Date   HGBA1C 7.0 (A) 09/19/2021   HGBA1C 7.4 (A) 07/11/2021   HGBA1C 8.0 (H) 03/04/2021   HGBA1C 7.8 (A) 01/17/2021   HGBA1C 7.1 (A) 10/24/2020    Her weight continues to decline despite a continued report of decreased but adequate intake/appetite Estimated body mass index is 39.27 kg/m as calculated from the following:   Height as of an earlier encounter on 11/12/21: 5\' 5"  (1.651 m).   Weight as of an earlier encounter on 11/12/21: 236 lb (107 kg).  Wt Readings from Last 10 Encounters:  11/12/21 236 lb (107 kg)  10/27/21 239 lb 13.8 oz (108.8 kg)  10/17/21 228 lb 8 oz (103.6 kg)  10/10/21 242 lb 11.2 oz (110.1 kg)  10/03/21 232 lb 3.2 oz (105.3 kg)  09/19/21 237 lb 14.4 oz (107.9 kg)  08/16/21 248 lb (112.5 kg)  07/11/21 248 lb (112.5 kg)  06/15/21 240 lb 15.4 oz (109.3 kg)  04/26/21 257 lb 9.6 oz (116.8 kg)    Progress Towards Goal(s):  Some progress.   Nutritional Diagnosis:  NB-1.4 Self-monitoring deficit As related to not checking blood sugar when CGM stopped.  As evidenced by her report.    Intervention:  She is scheduled for 2 week at which time she agrees to bring two sensors to replace the samples and a third to use with  her reader after we set it up. She has an appointment in 2 weeks  to follow up and assures me she has been taking only 40 units of tresiba and will start taking 38 units daily per today's new order.  Coordination of care: discussed with her doctor today  Teaching Method Utilized: Visual, Auditory,Hands on Handouts given during visit include: After visit summary Barriers to learning/adherence to lifestyle change: competing values Demonstrated degree of understanding via:  Teach Back   Monitoring/Evaluation:  Dietary intake, exercise, cgm sensors and reader, and body weight in 2 week(s).  [name], RD 11/12/2021 3:25 PM.'

## 2021-11-12 NOTE — Patient Instructions (Signed)
Use your phone until our next visit.  Bring your reader and 3 sensors to our next visit.   Call me anytime!  [name] [phone]

## 2021-11-12 NOTE — Patient Instructions (Signed)
Thank you, [name] for allowing [place] to provide your care today. Today we discussed your urinary incontinence, diabetes, and your recent hospital visit.    1) We will continue your hydrochlorothiazide for now. Elevate the leg to help the swelling as well.  2) You will see Barry Brunner for your diabetes today. We recommend Free Style Libre 3 if this can be covered, since you had a couple of sugar readings that were low. We are decreasing your Tresiba to 38 units nightly due to some low sugar readings that we saw.  3) I am making a referral for a new sleeve for your prosthetic.    I have ordered the following labs for you:  Lab Orders  No laboratory test(s) ordered today      Referrals ordered today:   Referral Orders  No referral(s) requested today     I have ordered the following medication/changed the following medications:   Stop the following medications: Medications Discontinued During This Encounter  Medication Reason   pregabalin (LYRICA) 200 MG capsule Reorder   hydrochlorothiazide (HYDRODIURIL) 25 MG tablet Reorder   insulin degludec (TRESIBA FLEXTOUCH) 200 UNIT/ML FlexTouch Pen Reorder     Start the following medications: Meds ordered this encounter  Medications   hydrochlorothiazide (HYDRODIURIL) 25 MG tablet    Sig: Take 1 tablet (25 mg total) by mouth daily.    Dispense:  90 tablet    Refill:  0   pregabalin (LYRICA) 200 MG capsule    Sig: TAKE 1 CAPSULE BY MOUTH THREE TIMES A DAY    Dispense:  90 capsule    Refill:  2   insulin degludec (TRESIBA FLEXTOUCH) 200 UNIT/ML FlexTouch Pen    Sig: Inject 40 Units into the skin daily before breakfast.    Dispense:  27 mL    Refill:  8     Follow up:  1 month     Should you have any questions or concerns please call the internal medicine clinic at [phone].

## 2021-11-12 NOTE — Assessment & Plan Note (Signed)
Patient is following up with urology on December 29 due to episodes of urinary incontinence.  She states that this happens overnight.  She wears diapers at night and places pads on her bed while she sleeping.  These are completely soaked when she wakes up.  This is why she has been hesitant to start Lasix as well.

## 2021-11-12 NOTE — Progress Notes (Signed)
CC: hospital follow-up  HPI:  Jennifer [name] is a 60 y.o. with past medical history as noted below who presents to the clinic today for a hospital follow-up. Please see problem-based list for further details, assessments, and plans.   Past Medical History:  Diagnosis Date   Acute vestibular syndrome, resolved 03/02/2021   Angioedema 06/14/2021   Asthma    Burn of finger of right hand, second degree 02/05/2021   Occurred during cooking (frying), poor sensation due to neuropathy, pt punctured blister to allow it to drain, skin has since desquamated over dorsal joint, no infection.  Keep clean and dry, OTC antibacterial ointment.   Cataract    CHF (congestive heart failure) (HCC)    Chronic bronchitis ([place])    "I get it alot" (09/28/2013)   Chronic diastolic heart failure (HCC)    grade 2 per 2D echocardiogram (01/2013)   Chronic lower back pain    Chronic pain syndrome 12/03/2011   Likely secondary to depression, "fibromyalgia", neuropathy, and obesity. Lumbar MRI 2014 no sig change from prior (2008) : Stable hypertrophic facet disease most notable at L4-5. Stable shallow left foraminal/extraforaminal disc protrusion at L4-5. No direct neural compression.       COPD 01/08/2007   PFT's 05/2007 : FEV1/FVC 82, FEV1 64% pred, FEF 25-75% 40% predicted, 16% improvement in FEV1 with bronchodilators.      Depression    Diabetic peripheral neuropathy (HCC)    Dizziness, resolved (admitted with vestibular migraine)    DVT of upper extremity (deep vein thrombosis) ([place]) 03/11/2013   Secondary to PICC line. Right brachial vein, diagnosed on 03/10/2013 Coumadin for 3 months. 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    History of use of  hearing aid    Hyperlipidemia    Hyperplastic colon polyp 12/2010   Per colonoscopy (12/2010) - Dr. 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  Evaluated on 07/08/2021 by podiatrist Dr. Amalia Hailey who debrided necrotic tissue and prescribed doxycycline.  He will see her again in 3 weeks.  The location of this ulcer on the dorsal aspect of the toe is somewhat atypical for a purely diabetic foot wound, and she does have a strong DP pulse.  I did not examine h   Type II diabetes mellitus with peripheral circulatory disorders, uncontrolled DX: 1993   Insulin dep. Poor control. Complicated by diabetic foot ulcer and diabetic eye disease.     Review of Systems:   Review of Systems  Constitutional: Negative.   HENT: Negative.  Eyes: Negative.   Respiratory: Negative.    Cardiovascular: Negative.   Gastrointestinal: Negative.   Genitourinary: Negative.   Musculoskeletal: Negative.   Skin: Negative.   Neurological: Negative.   Endo/Heme/Allergies: Negative.   Psychiatric/Behavioral: Negative.      Physical Exam:  Vitals:   11/12/21 1414  BP: (!) 143/67  Pulse: 79  Temp: 97.8 F (36.6 C)  TempSrc: Oral  SpO2: 100%  Weight: 236 lb (107 kg)  Height: 5\' 5"  (1.651 m)    Physical Exam Constitutional:      General: She is not in acute distress.    Appearance: Normal  appearance.  HENT:     Head: Normocephalic and atraumatic.  Eyes:     Extraocular Movements: Extraocular movements intact.     Pupils: Pupils are equal, round, and reactive to light.  Cardiovascular:     Rate and Rhythm: Normal rate and regular rhythm.     Heart sounds: No murmur heard.   No friction rub. No gallop.  Pulmonary:     Effort: Pulmonary effort is normal.     Breath sounds: Normal breath sounds. No wheezing, rhonchi or rales.  Abdominal:     General: Abdomen is flat. There is no distension.  Musculoskeletal:     Comments: Right lower leg: No pitting edema.  Wound appears to be healing well.  Skin:    General: Skin is warm and dry.  Neurological:     General: No focal deficit present.     Mental Status: She is alert and oriented to person, place, and time. Mental status is at baseline.  Psychiatric:        Mood and Affect: Mood normal.        Behavior: Behavior normal.    Assessment & Plan:   See Encounters Tab for problem based charting.  Patient seen with Dr. Heber [place]

## 2021-11-12 NOTE — Progress Notes (Signed)
Letter to be faxed to [facility] appeals Fax # [phone]  Per conversation last evening with [facility] representative [place], who has provided an appeal #[id number].  Phone inquiries can be made at [phone] "[place]".

## 2021-11-12 NOTE — Assessment & Plan Note (Addendum)
BP 143/67 today.  No changes today, patient would like to continue her current blood pressure regimen before making any changes.

## 2021-11-12 NOTE — Assessment & Plan Note (Signed)
Patient is here today for hospital follow-up.  She states that she has been doing well since she was discharged.  Denies shortness of breath or chest pain.  She does occasionally notice swelling in her right leg.  She states that she elevates it some but could be elevating it more.  She states that she has not been on Lasix for quite some time due to needing to urinate and having urinary incontinence at night requiring pads on her bed.  P: We discussed possibly switching from HCTZ to Lasix as needed.  Patient would like to continue her current regimen before trying to restart Lasix.  We will revisit this at a later time

## 2021-11-15 NOTE — Progress Notes (Signed)
Internal Medicine Clinic Attending  Case discussed with Dr. Lorin Glass    At the time of the visit.  We reviewed the resident's history and exam and pertinent patient test results.  I agree with the assessment, diagnosis, and plan of care documented in the resident's note. Freestyle libre 3 device would be better for her however it is not yet covered by medicare.  Agree with small decrease of basal insulin due to slight hypoglycemia.  I did discuss with her that she may do better with PRN lasix or even daily dose lasix over HCTZ for volume management, certainly I would not try to combine the too.

## 2021-11-19 ENCOUNTER — Ambulatory Visit: Payer: Medicare Other | Admitting: Physical Therapy

## 2021-11-19 NOTE — Telephone Encounter (Signed)
Closing the loop - patient did present to ED same day.

## 2021-11-20 NOTE — Progress Notes (Signed)
Evaluation and management procedures were performed by the RD under my supervision and collaboration. I have reviewed the note and chart, and I agree with the management and plan as documented above.

## 2021-11-22 ENCOUNTER — Telehealth: Payer: Self-pay

## 2021-11-26 ENCOUNTER — Telehealth: Payer: Self-pay

## 2021-11-26 ENCOUNTER — Telehealth: Payer: Self-pay | Admitting: Dietician

## 2021-11-26 ENCOUNTER — Ambulatory Visit: Payer: Medicare Other | Admitting: Dietician

## 2021-11-26 ENCOUNTER — Ambulatory Visit: Payer: Medicare Other | Attending: Internal Medicine | Admitting: Physical Therapy

## 2021-11-26 NOTE — Telephone Encounter (Signed)
Requesting to speak with a nurse about pregabalin (LYRICA) 200 MG capsule, states it was denial. Please call pt back.

## 2021-11-26 NOTE — Telephone Encounter (Signed)
Unsure if she will make her appointment today. She is having transportation problems. She'll call before her appointment to let us know.   She also mentioned that her phone messed up and her sensor fell off so is not checking her blood sugar. She has been taking 70 units of Tresiba because she has not been abel to check her blood sugar. I asked her to take 38 units tresiba daily and not 70 units as this is unsafe. She verbalized understanding. She also verbalized understanding of how to put a sensor on if she is unable to make her appointment today.

## 2021-11-26 NOTE — Telephone Encounter (Signed)
Return pt's call who stated Lyrica and [name] were denied. Informed pt [facility]. Pt stated they suppose to go to [facility]. But [facility] and have [facility] to take her to pick the meds; she will call back if she has any problems.

## 2021-11-27 ENCOUNTER — Telehealth: Payer: Self-pay | Admitting: Dietician

## 2021-11-27 NOTE — Telephone Encounter (Signed)
Was able to start CGM after resetting password.

## 2021-11-27 NOTE — Telephone Encounter (Signed)
Had problems starting her CGM because she had problems with her password.I was unable to assist her so gave her [employer] support number to call. I also suggested that she can use her reader if this does not work and explained to her how to do that.

## 2021-11-29 ENCOUNTER — Other Ambulatory Visit: Payer: Self-pay

## 2021-11-29 NOTE — Telephone Encounter (Signed)
oxyCODONE-acetaminophen (PERCOCET/ROXICET) 5-325 MG tablet, REFILL [facility]QUEST @ [id number] [place], [place] - [address]

## 2021-12-02 NOTE — Telephone Encounter (Signed)
Patient calling back for pain med. States she's due today and first asked on 12/9. States her son is currently in ICU on "life support."

## 2021-12-02 NOTE — Telephone Encounter (Signed)
oxyCODONE-acetaminophen (PERCOCET/ROXICET) 5-325 MG tablet, refill request [id number] [place], [place].

## 2021-12-03 ENCOUNTER — Ambulatory Visit: Payer: Medicare Other | Admitting: Behavioral Health

## 2021-12-03 ENCOUNTER — Other Ambulatory Visit: Payer: Self-pay | Admitting: Student in an Organized Health Care Education/Training Program

## 2021-12-03 ENCOUNTER — Telehealth: Payer: Self-pay

## 2021-12-03 DIAGNOSIS — F419 Anxiety disorder, unspecified: Secondary | ICD-10-CM

## 2021-12-03 DIAGNOSIS — Z794 Long term (current) use of insulin: Secondary | ICD-10-CM

## 2021-12-03 DIAGNOSIS — F331 Major depressive disorder, recurrent, moderate: Secondary | ICD-10-CM

## 2021-12-03 DIAGNOSIS — E119 Type 2 diabetes mellitus without complications: Secondary | ICD-10-CM

## 2021-12-03 DIAGNOSIS — F4321 Adjustment disorder with depressed mood: Secondary | ICD-10-CM

## 2021-12-03 MED ORDER — OXYCODONE-ACETAMINOPHEN 5-325 MG PO TABS: 1.0000 | ORAL_TABLET | Freq: Three times a day (TID) | ORAL | 0 refills | Status: DC | PRN

## 2021-12-03 NOTE — Telephone Encounter (Signed)
Call from pt stating she needs a refill on Oxycodone. Pt is upset/crying about her son being in the hospital on life support and "he might not make it". States she does not know what she's going to do if he dies. Also states her other son is not doing well either - states he may harm himself per pt. I asked if he has a gun, she states no but he does have a gun permit. Also states her dad is not doing well - states "thus is all the family I have". I asked if she would speak to Dr Theodis Shove - states she will; Dr Theodis Shove stated she will call pt @ 1030AM.  I also ask pt if her son would speak to someone about what's going on - she states "No he wouldn't; he won't talk to me". She states she needs her pain medication.

## 2021-12-03 NOTE — Telephone Encounter (Signed)
Called patient to confirm her appointment patient wanted me to let you know that she is in medical ICU ,patient stated she is with her son call back:[phone]

## 2021-12-03 NOTE — Telephone Encounter (Signed)
Pt called and informed of refill. Very appreciative.

## 2021-12-03 NOTE — BH Specialist Note (Addendum)
Integrated Behavioral Health via Telemedicine Visit  12/03/2021 FATIME BISWELL [id number]  Number of Elsie visits: 1/6 Session Start time: 10:20am  Session End time: 10:50am Total time: 30  Referring Provider: Holley Raring, RN in Triage Patient/Family location: Pt is home in private [facility] Provider location: [facility] Office All persons participating in visit: Pt & Clinician Types of Service: Individual psychotherapy  I connected with [name] and/or [name]  self  via  Telephone or Video Enabled Telemedicine Application  (Video is Caregility application) and verified that I am speaking with the correct person using two identifiers. Discussed confidentiality: Yes   I discussed the limitations of telemedicine and the availability of in person appointments.  Discussed there is a possibility of technology failure and discussed alternative modes of communication if that failure occurs.  I discussed that engaging in this telemedicine visit, they consent to the provision of behavioral healthcare and the services will be billed under their insurance.  Patient and/or legal guardian expressed understanding and consented to Telemedicine visit: Yes   Presenting Concerns: Patient and/or family reports the following symptoms/concerns: elevated anx/dep due to situation w/her older Son hosp'd  on life-support in [place] [id number] Duration of problem: Since this past Sun; Severity of problem: moderate  Patient and/or Family's Strengths/Protective Factors: Social connections, Concrete supports in place (healthy food, safe environments, etc.), Sense of purpose, and Physical Health (exercise, healthy diet, medication compliance, etc.)  Goals Addressed: Patient will:  Reduce symptoms of: anxiety, depression, stress, and acute grief    Increase knowledge and/or ability of: coping skills and stress reduction   Demonstrate ability to: Increase healthy adjustment to current life  circumstances and Increase adequate support systems for patient/family  Progress towards Goals: Estb'd today; Pt will attend sessions as scheduled  Interventions: Interventions utilized:  Behavioral Activation and Supportive Counseling Standardized Assessments completed:  screeners prn  Pt seen in [facility] today & requested CMA alert Clinician she made it to [facility] to visit her Son. Her [place] drove her this afternoon, but did not stay. Contacted Pt on number provided & lft msg Clinician would come to [facility], [facility] @ visit for prayer.  Spoke w/Pt for approximately 15 min & shared mutual prayer w/Pt consent.     Patient and/or Family Response: Pt receptive to call today & agrees to future sessions  Assessment: Patient currently experiencing acute anx/dep & sadness over Son's situation in [facility].   Patient may benefit from cont'd sessions for mental health wellness support.  Plan: Follow up with behavioral health clinician on : 2-3 wks on telehealth for 60 min & attempt to see her @ [facility] visit w/Dr. Court Joy on Wed @ 1:45pm Behavioral recommendations: Cont psychotherapy for future appts to support Pt's mental health & acute grief rxn Referral(s): [place] (In Clinic)  I discussed the assessment and treatment plan with the patient and/or parent/guardian. They were provided an opportunity to ask questions and all were answered. They agreed with the plan and demonstrated an understanding of the instructions.   They were advised to call back or seek an in-person evaluation if the symptoms worsen or if the condition fails to improve as anticipated.  [name], LMFT

## 2021-12-04 ENCOUNTER — Encounter: Payer: Self-pay | Admitting: Internal Medicine

## 2021-12-04 ENCOUNTER — Ambulatory Visit (INDEPENDENT_AMBULATORY_CARE_PROVIDER_SITE_OTHER): Payer: Medicare Other | Admitting: Internal Medicine

## 2021-12-04 VITALS — BP 141/62 | HR 75 | Temp 97.9°F | Ht 65.0 in | Wt 237.2 lb

## 2021-12-04 DIAGNOSIS — Z794 Long term (current) use of insulin: Secondary | ICD-10-CM | POA: Diagnosis not present

## 2021-12-04 DIAGNOSIS — K5792 Diverticulitis of intestine, part unspecified, without perforation or abscess without bleeding: Secondary | ICD-10-CM | POA: Diagnosis not present

## 2021-12-04 DIAGNOSIS — E118 Type 2 diabetes mellitus with unspecified complications: Secondary | ICD-10-CM

## 2021-12-04 LAB — POCT GLYCOSYLATED HEMOGLOBIN (HGB A1C): Hemoglobin A1C: 7.7 % — AB (ref 4.0–5.6)

## 2021-12-04 LAB — GLUCOSE, CAPILLARY: Glucose-Capillary: 173 mg/dL — ABNORMAL HIGH (ref 70–99)

## 2021-12-04 MED ORDER — AMOXICILLIN-POT CLAVULANATE 875-125 MG PO TABS: 1.0000 | ORAL_TABLET | Freq: Two times a day (BID) | ORAL | 0 refills | Status: AC

## 2021-12-04 NOTE — Assessment & Plan Note (Signed)
Patient reports diarrhea for approximately 4 months. It has not been constant, but comes and goes. In the last few days she has been experiencing abdominal pain on her left lower quadrant of abdomen. She has had 4 to 5 bowel movements per day and they are described as watery. She is on Metformin, but says she has tolerated the dose she is on okay ( 500 mg BID) . She denies any recent antibiotics, fever, chill, hematochezia,melena, hx of kidney stones, hematuria, or n/v. Review of colonoscopy in 2012 show diverticula at that time in ascending colon and sigmoid colon.   Assessment/Plan: Patient at risk for worse outcomes, decision to treat with antibiotics as outpatient. Deferred CT abdomen and pelvis at this time given patient has allergy listed to IV Contrast. - amoxicillin-clavulanate (AUGMENTIN) 875-125 MG tablet; Take 1 tablet by mouth 2 (two) times daily for 7 days.  Dispense: 14 tablet; Refill: 0 - BMP8+Anion Gap

## 2021-12-04 NOTE — Assessment & Plan Note (Signed)
Hgb A1c 7 % at the last visit. Current A1c 7.7%. We did not have download of her meter today as she was her for another acute problem. Patient reports she is self managing and changing dosages of her Insulin at home. [name], diabetic educator, was able to get patient CGM working on 12/7.  Assessment/Plan: Type 2 diabetes with long term Insulin use, with complication, controlled.  Continue Metformin 500 mg BID Continue Tresiba 38 units daily  Continue Ozempic .5mg  weekly Repeat A1c at next visit if after 3 months from this date - follow up scheduled 12/11/21 with diabetes educator and 12/26/21 with PCP

## 2021-12-04 NOTE — Progress Notes (Signed)
CC: diverticulitis   HPI:Jennifer [name] is a 60 y.o. female who presents for evaluation of abdominal pain. Please see individual problem based A/P for details.   Past Medical History:  Diagnosis Date   Acute vestibular syndrome, resolved 03/02/2021   Angioedema 06/14/2021   Asthma    Burn of finger of right hand, second degree 02/05/2021   Occurred during cooking (frying), poor sensation due to neuropathy, pt punctured blister to allow it to drain, skin has since desquamated over dorsal joint, no infection.  Keep clean and dry, OTC antibacterial ointment.   Cataract    CHF (congestive heart failure) (HCC)    Chronic bronchitis ([place])    "I get it alot" (09/28/2013)   Chronic diastolic heart failure (HCC)    grade 2 per 2D echocardiogram (01/2013)   Chronic lower back pain    Chronic pain syndrome 12/03/2011   Likely secondary to depression, "fibromyalgia", neuropathy, and obesity. Lumbar MRI 2014 no sig change from prior (2008) : Stable hypertrophic facet disease most notable at L4-5. Stable shallow left foraminal/extraforaminal disc protrusion at L4-5. No direct neural compression.       COPD 01/08/2007   PFT's 05/2007 : FEV1/FVC 82, FEV1 64% pred, FEF 25-75% 40% predicted, 16% improvement in FEV1 with bronchodilators.      Depression    Diabetic peripheral neuropathy (HCC)    Dizziness, resolved (admitted with vestibular migraine)    DVT of upper extremity (deep vein thrombosis) ([place]) 03/11/2013   Secondary to PICC line. Right brachial vein, diagnosed on 03/10/2013 Coumadin for 3 months. 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    History of use of hearing aid    Hyperlipidemia    Hyperplastic colon polyp 12/2010   Per  colonoscopy (12/2010) - Dr. 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  Evaluated on 07/08/2021 by podiatrist Dr. Amalia Hailey who debrided necrotic tissue and prescribed doxycycline.  He will see her again in 3 weeks.  The location of this ulcer on the dorsal aspect of the toe is somewhat atypical for a purely diabetic foot wound, and she does have a strong DP pulse.  I did not examine h   Type II diabetes mellitus with peripheral circulatory disorders, uncontrolled DX: 1993   Insulin dep. Poor control. Complicated by diabetic foot ulcer and diabetic eye disease.     Review of Systems:   Review of Systems  Constitutional:  Negative for chills and fever.  Gastrointestinal:  Positive for abdominal pain and diarrhea. Negative for nausea  and vomiting.    Physical Exam: Vitals:   12/04/21 1429  BP: (!) 141/62  Pulse: 75  Temp: 97.9 F (36.6 C)  TempSrc: Oral  SpO2: 100%  Weight: 237 lb 3.2 oz (107.6 kg)  Height: 5\' 5"  (1.651 m)   General: Chronically ill appearing women sitting in wheelchair , prosthetic left leg HEENT: Strabismus of left eye, non icteric sclerae Cardiovascular: Normal rate, regular rhythm.  No murmurs Pulmonary : Equal breath sounds, No wheezes, rales, or rhonchi Abdominal: soft, tender to palpation in left lower quadrant, no guarding , no rebound  tenderness,  bowel sounds present   Assessment & Plan:   See Encounters Tab for problem based charting.  Patient discussed with Dr. Jimmye Norman

## 2021-12-04 NOTE — Patient Instructions (Signed)
Thank you for trusting me with your care. To recap, today we discussed the following:   1. Controlled type 2 diabetes mellitus with complication, with long-term current use of insulin (HCC) - POC Hbg A1C Lab Results  Component Value Date   HGBA1C 7.7 (A) 12/04/2021  Continue current regimen   2. Diverticulitis - amoxicillin-clavulanate (AUGMENTIN) 875-125 MG tablet; Take 1 tablet by mouth 2 (two) times daily for 7 days.  Dispense: 14 tablet; Refill: 0 - BMP8+Anion Gap

## 2021-12-05 ENCOUNTER — Ambulatory Visit: Payer: Medicare Other

## 2021-12-05 ENCOUNTER — Telehealth: Payer: Self-pay | Admitting: Podiatry

## 2021-12-05 LAB — BMP8+ANION GAP
Anion Gap: 14 mmol/L (ref 10.0–18.0)
BUN/Creatinine Ratio: 21 (ref 12–28)
BUN: 21 mg/dL (ref 8–27)
CO2: 25 mmol/L (ref 20–29)
Calcium: 9.6 mg/dL (ref 8.7–10.3)
Chloride: 104 mmol/L (ref 96–106)
Creatinine, Ser: 1 mg/dL (ref 0.57–1.00)
Glucose: 239 mg/dL — ABNORMAL HIGH (ref 70–99)
Potassium: 4 mmol/L (ref 3.5–5.2)
Sodium: 143 mmol/L (ref 134–144)
eGFR: 64 mL/min/{1.73_m2} (ref >59)

## 2021-12-05 NOTE — Telephone Encounter (Signed)
Pt left message apologizing for missing her appt today but had issues with transportation. Asked if she could come in at 437pm.  I returned call and it went to voicemail but it was full so I could not leave a message to call to r/s

## 2021-12-09 ENCOUNTER — Telehealth: Payer: Self-pay | Admitting: *Deleted

## 2021-12-09 ENCOUNTER — Telehealth: Payer: Self-pay | Admitting: Internal Medicine

## 2021-12-09 DIAGNOSIS — N3946 Mixed incontinence: Secondary | ICD-10-CM

## 2021-12-09 MED ORDER — MYRBETRIQ 50 MG PO TB24: 50.0000 mg | ORAL_TABLET | Freq: Every day | ORAL | 3 refills | Status: DC

## 2021-12-09 NOTE — Telephone Encounter (Signed)
Patient called in stating she has been completely out of Myrbetriq since 12/16. Requesting refill at [facility].

## 2021-12-09 NOTE — Telephone Encounter (Signed)
Call and review BMP with patient. Nl with exception of hyperglycemia with known T2DM. Patient started antibiotics Sunday for diverticulitis. She continues to have some abdominal pain and I recommended given the antibiotics a few more days to see if it helps. She has follow up with [name] on Wednesday. If not improving I asked patient to let us know when she is in clinic.

## 2021-12-09 NOTE — Progress Notes (Signed)
Internal Medicine Clinic Attending  Case discussed with Dr. Steen  At the time of the visit.  We reviewed the resident's history and exam and pertinent patient test results.  I agree with the assessment, diagnosis, and plan of care documented in the resident's note.  

## 2021-12-10 ENCOUNTER — Encounter: Payer: Medicare Other | Admitting: Dietician

## 2021-12-11 ENCOUNTER — Other Ambulatory Visit: Payer: Self-pay

## 2021-12-11 ENCOUNTER — Ambulatory Visit (INDEPENDENT_AMBULATORY_CARE_PROVIDER_SITE_OTHER): Payer: Medicare Other | Admitting: Dietician

## 2021-12-11 ENCOUNTER — Encounter: Payer: Self-pay | Admitting: Internal Medicine

## 2021-12-11 ENCOUNTER — Ambulatory Visit (INDEPENDENT_AMBULATORY_CARE_PROVIDER_SITE_OTHER): Payer: Medicare Other | Admitting: Internal Medicine

## 2021-12-11 ENCOUNTER — Other Ambulatory Visit (HOSPITAL_COMMUNITY): Payer: Self-pay

## 2021-12-11 ENCOUNTER — Encounter: Payer: Self-pay | Admitting: Dietician

## 2021-12-11 ENCOUNTER — Ambulatory Visit: Payer: Medicare Other

## 2021-12-11 DIAGNOSIS — M79644 Pain in right finger(s): Secondary | ICD-10-CM | POA: Diagnosis not present

## 2021-12-11 DIAGNOSIS — Z89512 Acquired absence of left leg below knee: Secondary | ICD-10-CM

## 2021-12-11 DIAGNOSIS — K5792 Diverticulitis of intestine, part unspecified, without perforation or abscess without bleeding: Secondary | ICD-10-CM

## 2021-12-11 DIAGNOSIS — Z89432 Acquired absence of left foot: Secondary | ICD-10-CM | POA: Diagnosis not present

## 2021-12-11 DIAGNOSIS — Z794 Long term (current) use of insulin: Secondary | ICD-10-CM | POA: Diagnosis not present

## 2021-12-11 DIAGNOSIS — T300 Burn of unspecified body region, unspecified degree: Secondary | ICD-10-CM | POA: Diagnosis not present

## 2021-12-11 DIAGNOSIS — E11621 Type 2 diabetes mellitus with foot ulcer: Secondary | ICD-10-CM | POA: Diagnosis not present

## 2021-12-11 DIAGNOSIS — E118 Type 2 diabetes mellitus with unspecified complications: Secondary | ICD-10-CM | POA: Diagnosis not present

## 2021-12-11 DIAGNOSIS — E0843 Diabetes mellitus due to underlying condition with diabetic autonomic (poly)neuropathy: Secondary | ICD-10-CM

## 2021-12-11 MED ORDER — DICLOFENAC SODIUM 1 % EX GEL
2.0000 g | Freq: Four times a day (QID) | CUTANEOUS | 0 refills | Status: DC
  Filled 2021-12-11: qty 100, 13d supply, fill #0

## 2021-12-11 MED ORDER — DICLOFENAC SODIUM 1 % EX GEL: 2.0000 g | Freq: Four times a day (QID) | CUTANEOUS | 0 refills | Status: DC

## 2021-12-11 MED ORDER — LOSARTAN POTASSIUM 50 MG PO TABS
50.0000 mg | ORAL_TABLET | Freq: Every day | ORAL | 11 refills | Status: DC
  Filled 2021-12-11: qty 30, 30d supply, fill #0

## 2021-12-11 MED ORDER — AMLODIPINE BESYLATE 10 MG PO TABS
10.0000 mg | ORAL_TABLET | Freq: Every day | ORAL | 3 refills | Status: DC
  Filled 2021-12-11: qty 90, 90d supply, fill #0

## 2021-12-11 NOTE — Progress Notes (Signed)
Medical Nutrition Therapy Start time: 782  End time: 1005  Assessment:  Jennifer [name] presents today for assistance with Continuous glucose monitoring. Today's CGM report did not show any hypoglycemia. .Blood sugars are higher than prior visit as patient stated. She has been taking 70 units [place] daily since her last visit. She wonders if she needs Humalog right now.  She thinks her blood sugars are higher because she is stressed due to her son being sick and other things.  However, she denies missing any medicine or any change in diet or beverage( sugar sweetened beverages) intake.  Plans to quit smoking in 2023. She anticipates she will need help with this. She will let us know. She does not think she is eating or drinking sodas more than usual.  Blood sugar is 182 mg/dl right now  CGM Results from download: 11/27/21 through 12/11/21  % Time CGM active:   56 %   (Goal >70%)  Average glucose:   222 mg/dL for 14 days  Glucose management indicator:   8.6 %  Time in range (70-180 mg/dL):   22 %   (Goal >70%)  Time High (181-250 mg/dL):   50 %   (Goal < 25%)  Time Very High (>250 mg/dL):    28 %   (Goal < 5%)  Time Low (54-69 mg/dL):   0 %   (Goal <4%)  Time Very Low (<54 mg/dL):   0 %   (Goal <1%)  Coefficient of variation:   23.5 %   (Goal <36%)    Lab Results  Component Value Date   HGBA1C 7.7 (A) 12/04/2021   HGBA1C 7.0 (A) 09/19/2021   HGBA1C 7.4 (A) 07/11/2021   HGBA1C 8.0 (H) 03/04/2021   HGBA1C 7.8 (A) 01/17/2021     Estimated body mass index is 40.1 kg/m as calculated from the following:   Height as of an earlier encounter on 12/11/21: 5\' 5"  (1.651 m).   Weight as of an earlier encounter on 12/11/21: 241 lb (109.3 kg).  Wt Readings from Last 10 Encounters:  12/11/21 241 lb (109.3 kg)  12/04/21 237 lb 3.2 oz (107.6 kg)  11/12/21 236 lb (107 kg)  10/27/21 239 lb 13.8 oz (108.8 kg)  10/17/21 228 lb 8 oz (103.6 kg)  10/10/21 242 lb 11.2 oz (110.1 kg)  10/03/21 232 lb 3.2 oz  (105.3 kg)  09/19/21 237 lb 14.4 oz (107.9 kg)  08/16/21 248 lb (112.5 kg)  07/11/21 248 lb (112.5 kg)    Progress Towards Goal(s):  Some progress.   Nutritional Diagnosis:  NB-1.4 Self-monitoring deficit As related to not checking blood sugar when CGM stopped.  As evidenced by her report.    Intervention:  nutrition counseling and support, assisted with appointment with doctor to adjust hr insulin Coordination of care: discussed with her doctor today  Teaching Method Utilized: Visual, Auditory,Hands on Handouts given during visit include: After visit summary Barriers to learning/adherence to lifestyle change: competing values Demonstrated degree of understanding via:  Teach Back   Monitoring/Evaluation:  Dietary intake, exercise, cgm reader, and body weight in 4 week(s).  [name], RD 12/11/2021 3:45 PM.'   .

## 2021-12-11 NOTE — Progress Notes (Signed)
SITUATION Reason for Visit: Fitting of Diabetic Shoes & Insoles Patient / Caregiver Report:  Patient reports comfort  OBJECTIVE DATA: Patient History / Diagnosis:     ICD-10-CM   1. Diabetes mellitus due to underlying condition with diabetic autonomic neuropathy, unspecified whether long term insulin use (HCC)  E08.43     2. Hx of BKA, left ([place])  Z89.512       Change in Status:   None  ACTIONS PERFORMED: In-Person Delivery, patient was fit with: - 1x pair A5500 PDAC approved prefabricated Diabetic Shoes: [place] 628E shoes - 3x right A5513 PDAC approved vacuum formed custom diabetic insoles  Shoes and insoles were verified for structural integrity and safety. Patient wore shoes and insoles in office. Skin was inspected and free of areas of concern after wearing shoes and inserts. Shoes and inserts fit properly. Patient / Caregiver provided with ferbal instruction and demonstration regarding donning, doffing, wear, care, proper fit, function, purpose, cleaning, and use of shoes and insoles ' and in all related precautions and risks and benefits regarding shoes and insoles. Patient / Caregiver was instructed to wear properly fitting socks with shoes at all times. Patient was also provided with verbal instruction regarding how to report any failures or malfunctions of shoes or inserts, and necessary follow up care. Patient / Caregiver was also instructed to contact physician regarding change in status that may affect function of shoes and inserts.   Patient / Caregiver verbalized undersatnding of instruction provided. Patient / Caregiver demonstrated independence with proper donning and doffing of shoes and inserts.  PLAN Patient to follow up as needed. Plan of care was discussed with and agreed upon by patient and/or caregiver. All questions were answered and concerns addressed.

## 2021-12-11 NOTE — Patient Instructions (Addendum)
Ms.Paytience L Padmanabhan, it was a pleasure seeing you today! You endorsed feeling well today. Below are some of the things we talked about this visit. We look forward to seeing you in the follow up appointment!  Today we discussed: Diverticulitis: You abdominal pain is still present but your bowel function has improved from diarrhea to more formed stool. Finish the antibiotics and if the pain does not improve we will perform imaging. Please let us know if this continues to be an issue for you.  For your finger pain, please ice the middle finger and move it as much as possible. You can also use Voltaren gel for on the middle finger. For your index finger on the right hand, I advise you to use Vaseline on it and keep it covered with a bandage.    I have ordered the following labs today:  Lab Orders  No laboratory test(s) ordered today      Referrals ordered today:   Referral Orders  No referral(s) requested today     I have ordered the following medication/changed the following medications:   Stop the following medications:  Start the following medications: Voltaren Gel for your finger.   Follow-up: Scheduled follow up with Dr. Jimmye Norman  Please make sure to arrive 15 minutes prior to your next appointment. If you arrive late, you may be asked to reschedule.   We look forward to seeing you next time. Please call our clinic at [phone] if you have any questions or concerns. The best time to call is Monday-Friday from 9am-4pm, but there is someone available 24/7. If after hours or the weekend, call the main hospital number and ask for the Internal Medicine Resident On-Call. If you need medication refills, please notify your pharmacy one week in advance and they will send [place] a request.  Thank you for letting us take part in your care. Wishing you the best!  Thank you, Idamae Schuller, MD
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
## 2021-12-12 ENCOUNTER — Other Ambulatory Visit: Payer: Medicare Other

## 2021-12-14 DIAGNOSIS — T300 Burn of unspecified body region, unspecified degree: Secondary | ICD-10-CM | POA: Insufficient documentation

## 2021-12-14 DIAGNOSIS — M79644 Pain in right finger(s): Secondary | ICD-10-CM | POA: Insufficient documentation

## 2021-12-14 NOTE — Assessment & Plan Note (Addendum)
Patient continued to have LLQ pain. Was started on augemtin for 7 days. She had 3 days of antibiotics left at the time of the visit. Denied any fevers, chills, blood in the stool or vomiting. Patient had diarrhea prior to starting the antibiotics but now states the stools are formed. Advised patient to complete the antibiotics and see if patient's symptoms resolve. If pain persists then we will schedule imaging for the patient. She endorsed allergy to contrast which she stated made her kidneys shut down.  -Finish antibiotics -Will call patient on after 4 days to see if pain persist and if it does, then schedule imaging.  -Return precautions given if worsening  Addendum: Called patient on 12/16/21 to inquire about her symptoms. She stated she was having normal bowel function but the pain was still there. She did have improvements in her pain since onset. I recommended scheduling imaging which she stated she would like to do during the same day as her visit with Dr. Jimmye Norman. I advised her Dr. Jimmye Norman will evaluate you and schedule it if it is needed. Return precautions again given if she has worsening of her pain. Patient agreed.

## 2021-12-14 NOTE — Assessment & Plan Note (Signed)
Patient has pain in 3rd digit of right hand. No inciting events identified by the patient. States woke up with the pain. DDx included ligament strain vs dislocation. Physical exam shows decreased range of motion in the finger but no deformity. Mild swelling but no erythema. Digit TTP. Most consistent with ligament strain.  -Advised icing the finger, range of motion exercises as tolerated -Voltaren gel  -Use tylenol (total of 3000 mg daily) but no NSAIDs
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
## 2021-12-18 NOTE — Progress Notes (Signed)
Internal Medicine Clinic Attending  I saw and evaluated the patient.  I personally confirmed the key portions of the history and exam documented by Dr. Khan and I reviewed pertinent patient test results.  The assessment, diagnosis, and plan were formulated together and I agree with the documentation in the resident's note.  

## 2021-12-20 ENCOUNTER — Encounter (HOSPITAL_BASED_OUTPATIENT_CLINIC_OR_DEPARTMENT_OTHER): Payer: Medicare Other | Attending: Internal Medicine | Admitting: Internal Medicine

## 2021-12-20 ENCOUNTER — Other Ambulatory Visit: Payer: Self-pay

## 2021-12-20 DIAGNOSIS — Z79891 Long term (current) use of opiate analgesic: Secondary | ICD-10-CM | POA: Insufficient documentation

## 2021-12-20 DIAGNOSIS — T23201A Burn of second degree of right hand, unspecified site, initial encounter: Secondary | ICD-10-CM

## 2021-12-20 DIAGNOSIS — X088XXA Exposure to other specified smoke, fire and flames, initial encounter: Secondary | ICD-10-CM | POA: Diagnosis not present

## 2021-12-20 DIAGNOSIS — Z794 Long term (current) use of insulin: Secondary | ICD-10-CM | POA: Insufficient documentation

## 2021-12-20 DIAGNOSIS — L98498 Non-pressure chronic ulcer of skin of other sites with other specified severity: Secondary | ICD-10-CM

## 2021-12-20 DIAGNOSIS — I110 Hypertensive heart disease with heart failure: Secondary | ICD-10-CM | POA: Insufficient documentation

## 2021-12-20 DIAGNOSIS — E1151 Type 2 diabetes mellitus with diabetic peripheral angiopathy without gangrene: Secondary | ICD-10-CM | POA: Diagnosis not present

## 2021-12-20 DIAGNOSIS — Z89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): Secondary | ICD-10-CM | POA: Diagnosis not present

## 2021-12-20 DIAGNOSIS — E11622 Type 2 diabetes mellitus with other skin ulcer: Secondary | ICD-10-CM | POA: Diagnosis not present

## 2021-12-20 DIAGNOSIS — E1140 Type 2 diabetes mellitus with diabetic neuropathy, unspecified: Secondary | ICD-10-CM | POA: Insufficient documentation

## 2021-12-20 DIAGNOSIS — E11319 Type 2 diabetes mellitus with unspecified diabetic retinopathy without macular edema: Secondary | ICD-10-CM | POA: Diagnosis not present

## 2021-12-20 DIAGNOSIS — I5032 Chronic diastolic (congestive) heart failure: Secondary | ICD-10-CM | POA: Insufficient documentation

## 2021-12-20 DIAGNOSIS — Z89512 Acquired absence of left leg below knee: Secondary | ICD-10-CM | POA: Insufficient documentation
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
## 2021-12-20 NOTE — Progress Notes (Signed)
Jennifer, [name] ([id number]) Visit Report for 12/20/2021 Chief Complaint Document Details Patient Name: Date of Service: Jennifer [name], Jennifer [name] 12/20/2021 10:15 A M Medical Record Number: [id number] Patient Account Number: [id number] Date of Birth/Sex: Treating RN: 07-10-1961 (60 y.o. Elam Dutch Primary Care Provider: [name] Other Clinician: Referring Provider: Treating Provider/Extender: [name] in Treatment: 15 Information Obtained from: Patient Chief Complaint 10/29/2021; patient presents for area of skin breakdown to her right lower extremity 12/30; patient presents for burn to her right second finger Electronic Signature(s) Signed: 12/20/2021 11:33:16 AM By: Kalman Shan DO Entered By: Kalman Shan on 12/20/2021 11:16:48 -------------------------------------------------------------------------------- Debridement Details Patient Name: Date of Service: Jennifer Reader L. 12/20/2021 10:15 A M Medical Record Number: [id number] Patient Account Number: [id number] Date of Birth/Sex: Treating RN: 27-Apr-1961 (60 y.o. [name] Primary Care Provider: [name] Other Clinician: Referring Provider: Treating Provider/Extender: [name] in Treatment: 15 Debridement Performed for Assessment: Wound #24 Right Hand - 2nd Digit Performed By: Physician Kalman Shan, DO Debridement Type: Debridement Level of Consciousness (Pre-procedure): Awake and Alert Pre-procedure Verification/Time Out Yes - 10:59 Taken: Start Time: 11:00 Pain Control: Other : Benzocaine T Area Debrided (L x W): otal 0.9 (cm) x 0.8 (cm) = 0.72 (cm) Tissue and other material debrided: Non-Viable, Slough, Subcutaneous, Slough Level: Skin/Subcutaneous Tissue Debridement Description: Excisional Instrument: Curette Bleeding: Minimum Hemostasis Achieved: Pressure End Time: 11:05 Response to Treatment: Procedure was  tolerated well Level of Consciousness (Post- Awake and Alert procedure): Post Debridement Measurements of Total Wound Length: (cm) 0.9 Width: (cm) 0.8 Depth: (cm) 0.1 Volume: (cm) 0.057 Character of Wound/Ulcer Post Debridement: Stable Post Procedure Diagnosis Same as Pre-procedure Electronic Signature(s) Signed: 12/20/2021 11:33:16 AM By: Kalman Shan DO Signed: 12/20/2021 12:30:09 PM By: Lorrin Jackson Entered By: Lorrin Jackson on 12/20/2021 11:05:37 -------------------------------------------------------------------------------- HPI Details Patient Name: Date of Service: Jennifer Reader L. 12/20/2021 10:15 A M Medical Record Number: [id number] Patient Account Number: [id number] Date of Birth/Sex: Treating RN: 1961-10-06 (60 y.o. Elam Dutch Primary Care Provider: [name] Other Clinician: Referring Provider: Treating Provider/Extender: [name] in Treatment: 15 History of Present Illness HPI Description: Admission 10/29/2021 [name] is a 60 year old female with a past medical history of insulin-dependent type 2 diabetes, left lower extremity BKA, right partial fifth ray amputation that presents today for a small open wound to her right lower extremity. She was recently hospitalized on 10/24/2021 for acute on chronic HFpEF. She was noted to Have a small open wound with weeping to her right lower extremity. She was diuresed and also placed in an Haematologist. She reports no issues since discharge. She states that the swelling on her right lower extremity has improved. She currently denies signs of infection. 11/15; patient presents for follow-up. She has no issues or complaints today. She reports her wound is healed and has her compression stocking today. Readmission 12/20/2021 Patient presents with a new wound to her right second finger caused by a burn 1 month ago. She has been using antibiotic ointment on the site. She  reports no improvement in wound healing over the past month. She currently denies signs of infection. Electronic Signature(s) Signed: 12/20/2021 11:33:16 AM By: Kalman Shan DO Entered By: Kalman Shan on 12/20/2021 11:22:13 -------------------------------------------------------------------------------- Physical Exam Details Patient Name: Date of Service: Jennifer, [name] 12/20/2021 10:15 A M Medical Record Number: [id number] Patient Account Number: [id number] Date of Birth/Sex: Treating RN: 09/21/1961 (  60 y.o. Elam Dutch Primary Care Provider: [name] Other Clinician: Referring Provider: Treating Provider/Extender: [name] in Treatment: 15 Constitutional respirations regular, non-labored and within target range for patient.Marland Kitchen Psychiatric pleasant and cooperative. Notes Right second finger with open wound and nonviable tissue throughout. No signs of infection. Electronic Signature(s) Signed: 12/20/2021 11:33:16 AM By: Kalman Shan DO Entered By: Kalman Shan on 12/20/2021 11:22:52 -------------------------------------------------------------------------------- Physician Orders Details Patient Name: Date of Service: Jennifer [name] 12/20/2021 10:15 A M Medical Record Number: [id number] Patient Account Number: [id number] Date of Birth/Sex: Treating RN: 1961-06-05 (60 y.o. [name] Primary Care Provider: [name] Other Clinician: Referring Provider: Treating Provider/Extender: [name] in Treatment: 15 Verbal / Phone Orders: No Diagnosis Coding Follow-up Appointments ppointment in 1 week. - with Dr. Heber [place] Return A Bathing/ Shower/ Hygiene May shower and wash wound with soap and water. - when changing dressing Additional Orders / Instructions Follow Nutritious Diet Wound Treatment Wound #24 - Hand - 2nd Digit Wound Laterality: Right Cleanser: Soap and Water  1 x Per Day/30 Days Discharge Instructions: May shower and wash wound with dial antibacterial soap and water prior to dressing change. Prim Dressing: Hydrofera Blue Ready Foam, 2.5 x2.5 in 1 x Per Day/30 Days ary Discharge Instructions: Apply to wound bed as instructed Prim Dressing: Santyl Ointment 1 x Per Day/30 Days ary Discharge Instructions: Apply nickel thick amount to wound bed as instructed Secondary Dressing: Woven Gauze Sponge, Non-Sterile 4x4 in 1 x Per Day/30 Days Discharge Instructions: Apply over primary dressing as directed. Secured With: 60M Medipore H Soft Cloth Surgical T ape, 4 x 10 (in/yd) 1 x Per Day/30 Days Discharge Instructions: Secure with tape as directed. Secured With: Netting 1 x Per Day/30 Days Radiology X-ray, Right Hand - Non-healing wound right 2nd digit Patient Medications llergies: Iodinated Contrast- Oral and IV Dye, morphine, benazepril, pineapple, Abilify A Notifications Medication Indication Start End 12/20/2021 Santyl DOSE 1 - topical 250 unit/gram ointment - 1 application daily for 15 days Electronic Signature(s) Signed: 12/20/2021 11:33:16 AM By: Kalman Shan DO Previous Signature: 12/20/2021 11:31:30 AM Version By: Kalman Shan DO Previous Signature: 12/20/2021 11:27:55 AM Version By: Kalman Shan DO Entered By: Kalman Shan on 12/20/2021 11:32:15 Prescription 12/20/2021 -------------------------------------------------------------------------------- Phil Dopp L. Kalman Shan DO Patient Name: Provider: 06-26-1961 [id number] Date of Birth: NPI#: [id number] Sex: DEA #: [id number] [phone] Phone #: License #: [place] Patient Address: [facility] [address] [place], [place] [address] [place], [place] [address] [phone] Allergies Iodinated Contrast- Oral and IV Dye; morphine; benazepril; pineapple; Abilify Provider's Orders X-ray,  Right Hand - Non-healing wound right 2nd digit Hand Signature: Date(s): Electronic Signature(s) Signed: 12/20/2021 11:33:16 AM By: Kalman Shan DO Entered By: Kalman Shan on 12/20/2021 11:32:16 -------------------------------------------------------------------------------- Problem List Details Patient Name: Date of Service: Jennifer Reader L. 12/20/2021 10:15 A M Medical Record Number: [id number] Patient Account Number: [id number] Date of Birth/Sex: Treating RN: 07/29/1961 (60 y.o. Elam Dutch Primary Care Provider: [name] Other Clinician: Referring Provider: Treating Provider/Extender: [name] in Treatment: 15 Active Problems ICD-10 Encounter Code Description Active Date MDM Diagnosis T23.201A Burn of second degree of right hand, unspecified site, initial encounter 12/20/2021 No Yes L98.498 Non-pressure chronic ulcer of skin of other sites with other specified severity 12/20/2021 No Yes E11.622 Type 2 diabetes mellitus with other skin ulcer 09/03/2021 No Yes I73.9 Peripheral  vascular disease, unspecified 09/03/2021 No Yes F02.77 Chronic diastolic (congestive) heart failure 09/03/2021 No Yes Z89.512 Acquired absence of left leg below knee 09/03/2021 No Yes Z89.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) 09/03/2021 No Yes Z79.891 Long term (current) use of opiate analgesic 09/03/2021 No Yes Inactive Problems ICD-10 Code Description Active Date Inactive Date S91.301A Unspecified open wound, right foot, initial encounter 09/03/2021 09/03/2021 L97.811 Non-pressure chronic ulcer of other part of right lower leg limited to breakdown of skin 10/29/2021 10/29/2021 Resolved Problems ICD-10 Code Description Active Date Resolved Date E11.621 Type 2 diabetes mellitus with foot ulcer 09/03/2021 09/03/2021 S91.301D Unspecified open wound, right foot, subsequent encounter 09/10/2021 09/10/2021 Electronic Signature(s) Signed: 12/20/2021 11:33:16  AM By: Kalman Shan DO Entered By: Kalman Shan on 12/20/2021 11:15:06 -------------------------------------------------------------------------------- Progress Note Details Patient Name: Date of Service: Jennifer [name]. 12/20/2021 10:15 A M Medical Record Number: [id number] Patient Account Number: [id number] Date of Birth/Sex: Treating RN: [birth year] (60 y.o. Elam Dutch Primary Care Provider: [name] Other Clinician: Referring Provider: Treating Provider/Extender: [name] in Treatment: 15 Subjective Chief Complaint Information obtained from Patient 10/29/2021; patient presents for area of skin breakdown to her right lower extremity 12/30; patient presents for burn to her right second finger History of Present Illness (HPI) Admission 10/29/2021 Ms. Dietrich Ke is a 60 year old female with a past medical history of insulin-dependent type 2 diabetes, left lower extremity BKA, right partial fifth ray amputation that presents today for a small open wound to her right lower extremity. She was recently hospitalized on 10/24/2021 for acute on chronic HFpEF. She was noted to Have a small open wound with weeping to her right lower extremity. She was diuresed and also placed in an Haematologist. She reports no issues since discharge. She states that the swelling on her right lower extremity has improved. She currently denies signs of infection. 11/15; patient presents for follow-up. She has no issues or complaints today. She reports her wound is healed and has her compression stocking today. Readmission 12/20/2021 Patient presents with a new wound to her right second finger caused by a burn 1 month ago. She has been using antibiotic ointment on the site. She reports no improvement in wound healing over the past month. She currently denies signs of infection. Patient History Information obtained from Patient. Family History Diabetes - Mother,  Heart Disease - Mother, Hypertension - Mother, Seizures - Siblings, Stroke - Siblings, No family history of Cancer, Hereditary Spherocytosis, Kidney Disease, Lung Disease, Thyroid Problems, Tuberculosis. Social History Current every day smoker, Marital Status - Divorced, Alcohol Use - Never, Drug Use - No History, Caffeine Use - Moderate. Medical History Eyes Patient has history of Cataracts, Glaucoma - due to [place] Denies history of Optic Neuritis Ear/Nose/Mouth/Throat Patient has history of Chronic sinus problems/congestion - Flonase Denies history of Middle ear problems Hematologic/Lymphatic Patient has history of Anemia Denies history of Hemophilia, Human Immunodeficiency Virus, Lymphedema, Sickle Cell Disease Respiratory Patient has history of Asthma, Chronic Obstructive Pulmonary Disease (COPD) Denies history of Aspiration Cardiovascular Patient has history of Congestive Heart Failure - chronic diastolic, Hypertension, Peripheral Arterial Disease, 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 Denies history of Angina, Arrhythmia, Coronary Artery Disease, Deep Vein Thrombosis, Hypotension, Myocardial Infarction, Phlebitis, Vasculitis Gastrointestinal Denies history of Cirrhosis , Colitis, Crohnoos, Hepatitis A, Hepatitis B, Hepatitis C Endocrine Patient has history of Type II Diabetes - uncontrolled w/ side effects Denies history of Type I Diabetes Genitourinary Denies history of End Stage Renal  Disease Immunological Denies history of Lupus Erythematosus, Raynaudoos, Scleroderma Integumentary (Skin) Denies history of History of Burn Musculoskeletal Patient has history of Rheumatoid Arthritis, Osteoarthritis - spine, Osteomyelitis - hx left foot Denies history of Gout Neurologic Patient has history of Neuropathy, Seizure Disorder Denies history of Dementia, Quadriplegia, Paraplegia Oncologic Denies history of Received Chemotherapy, Received Radiation Psychiatric Denies history of  Anorexia/bulimia, Confinement Anxiety Hospitalization/Surgery History - left leg amputated. - COPD, PNA. 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 Medical A Surgical History Notes nd Constitutional Symptoms (General Health) chronic insomnia , opioid dependence , gait abnormality , severe obesity , s/p BKA , Eyes diabetic retinopathy Ear/Nose/Mouth/Throat oral mucosa lesions Hematologic/Lymphatic thrombocytopenia Cardiovascular hyperlipidemia , infective endocarditis , aortic atherosclerosis , Gastrointestinal GERD Integumentary (Skin) foot ulcer Neurologic childhood seizures Hx Bells Palsy Psychiatric depression Objective Constitutional respirations regular, non-labored and within target range for patient.. Vitals Time Taken: 10:32 AM, Height: 67 in, Weight: 240 lbs, BMI: 37.6, Temperature: 98.6 F, Pulse: 80 bpm, Respiratory Rate: 20 breaths/min, Blood Pressure: 161/79 mmHg. Psychiatric pleasant and cooperative. General Notes: Right second finger with open wound and nonviable tissue throughout. No signs of infection. Integumentary (Hair, Skin) Wound #24 status is Open. Original cause of wound was Blister. The date acquired was: 11/19/2021. The wound is located on the Right Hand - 2nd Digit. The wound measures 0.9cm length x 0.8cm width x 0.1cm depth; 0.565cm^2 area and 0.057cm^3 volume. There is Fat Layer (Subcutaneous Tissue) exposed. There is undermining starting at 10:00 and ending at 2:00 with a maximum distance of 0.5cm. There is a medium amount of serosanguineous drainage noted. The wound margin is distinct with the outline attached to the wound base. There is medium (34-66%) pink granulation within the wound bed. There is a medium (34-66%) amount of necrotic tissue within the wound bed including Adherent Slough. 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 I debrided nonviable tissue. No signs of infection on exam. I recommended Santyl and Hydrofera Blue to the wound bed. Due to the chronicity of the wound I recommended an x-ray of the hand. Follow-up in 1 week. Procedures Wound #24 Pre-procedure diagnosis of Wound #24 is a 2nd degree Burn located on the Right Hand - 2nd Digit . There was a Excisional Skin/Subcutaneous Tissue Debridement with a total area of 0.72 sq cm performed by Kalman Shan, DO. With the following instrument(s): Curette to remove Non-Viable tissue/material. Material removed includes Subcutaneous Tissue and Slough and after achieving pain control using Other (Benzocaine). No specimens were taken. A time out was conducted at 10:59, prior to the start of the procedure. A Minimum amount of bleeding was controlled with Pressure. The procedure was tolerated well. Post Debridement Measurements: 0.9cm length x 0.8cm width x 0.1cm depth; 0.057cm^3 volume. Character of Wound/Ulcer Post Debridement is stable. Post procedure Diagnosis Wound #24: Same as Pre-Procedure Plan Follow-up Appointments: Return Appointment in 1 week. - with Dr. Marigene Ehlers Shower/ Hygiene: May shower and wash wound with soap and water. - when changing dressing Additional Orders / Instructions: Follow Nutritious Diet Radiology ordered were: X-ray, Right Hand -  Non-healing wound right 2nd digit The following medication(s) was prescribed: Santyl topical 250 unit/gram ointment 1 1 application daily for 15 days starting 12/20/2021 WOUND #24: - Hand - 2nd Digit Wound Laterality:  Right Cleanser: Soap and Water 1 x Per Day/30 Days Discharge Instructions: May shower and wash wound with dial antibacterial soap and water prior to dressing change. Prim Dressing: Hydrofera Blue Ready Foam, 2.5 x2.5 in 1 x Per Day/30 Days ary Discharge Instructions: Apply to wound bed as instructed Prim Dressing: Santyl Ointment 1 x Per Day/30 Days ary Discharge Instructions: Apply nickel thick amount to wound bed as instructed Secondary Dressing: Woven Gauze Sponge, Non-Sterile 4x4 in 1 x Per Day/30 Days Discharge Instructions: Apply over primary dressing as directed. Secured With: 23M Medipore H Soft Cloth Surgical T ape, 4 x 10 (in/yd) 1 x Per Day/30 Days Discharge Instructions: Secure with tape as directed. Secured With: Netting 1 x Per Day/30 Days 1. In office sharp debridement 2. Santyl and Hydrofera Blue 3. Follow-up in 1 week Electronic Signature(s) Signed: 12/20/2021 11:33:16 AM By: Kalman Shan DO Entered By: Kalman Shan on 12/20/2021 11:32:34 -------------------------------------------------------------------------------- HxROS Details Patient Name: Date of Service: Jennifer Reader L. 12/20/2021 10:15 A M Medical Record Number: [id number] Patient Account Number: [id number] Date of Birth/Sex: Treating RN: 1961-11-23 (60 y.o. 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 Negative for: Optic Neuritis Past Medical History Notes: diabetic retinopathy Ear/Nose/Mouth/Throat Medical History: Positive for: Chronic sinus problems/congestion - Flonase Negative  for: Middle ear problems Past Medical History Notes: oral mucosa lesions Hematologic/Lymphatic Medical History: Positive for: Anemia Negative for: Hemophilia; Human Immunodeficiency Virus; Lymphedema; Sickle Cell Disease Past Medical History Notes: thrombocytopenia Respiratory Medical History: Positive for: Asthma; Chronic Obstructive Pulmonary Disease (COPD) Negative for: Aspiration Cardiovascular Medical History: Positive for: Congestive Heart Failure - chronic diastolic; Hypertension; Peripheral Arterial Disease; 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 Negative for: Angina; Arrhythmia; Coronary Artery Disease; Deep Vein Thrombosis; Hypotension; Myocardial Infarction; Phlebitis; Vasculitis Past Medical History Notes: hyperlipidemia , infective endocarditis , aortic atherosclerosis , Gastrointestinal Medical History: Negative for: Cirrhosis ; Colitis; Crohns; Hepatitis A; Hepatitis B; Hepatitis C Past Medical History Notes: GERD Endocrine Medical History: Positive for: Type II Diabetes - uncontrolled w/ side effects Negative for: Type I Diabetes Time with diabetes: 1993 Treated with: Insulin Blood sugar tested every day: Yes Tested : daily Genitourinary Medical History: Negative for: End Stage Renal Disease Immunological Medical History: Negative for: Lupus Erythematosus; Raynauds; Scleroderma Integumentary (Skin) Medical History: Negative for: History of Burn Past Medical History Notes: foot ulcer Musculoskeletal Medical History: Positive for: Rheumatoid Arthritis; Osteoarthritis - spine; Osteomyelitis - hx left foot Negative for: Gout Neurologic Medical History: Positive for: Neuropathy; Seizure Disorder Negative for: Dementia; Quadriplegia; Paraplegia Past Medical History Notes: childhood seizures Hx Bells Palsy Oncologic Medical History: Negative for: Received Chemotherapy; Received Radiation Psychiatric Medical History: Negative for: Anorexia/bulimia;  Confinement Anxiety Past Medical History Notes: depression HBO Extended History Items Ear/Nose/Mouth/Throat: Eyes: Eyes: Chronic sinus Cataracts Glaucoma problems/congestion Immunizations Pneumococcal Vaccine: Received Pneumococcal Vaccination: Yes Received Pneumococcal Vaccination On or After 60th Birthday: No Immunization Notes: tetanus shot in 2014 Implantable Devices None Hospitalization / Surgery History Type of Hospitalization/Surgery left leg  amputated COPD, 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 Family and Social History Cancer: No; Diabetes: Yes - Mother; Heart Disease: Yes - Mother; Hereditary Spherocytosis: No; Hypertension: Yes - Mother; Kidney Disease: No; Lung Disease: No; Seizures: Yes - Siblings; Stroke: Yes - Siblings; Thyroid Problems: No; Tuberculosis: No; Current every day smoker; Marital Status - Divorced; Alcohol Use: Never; Drug Use: No History; Caffeine Use: Moderate; Financial Concerns: No; Food, Clothing or Shelter Needs: No; Support System Lacking: No; Transportation Concerns: No Electronic Signature(s) Signed: 12/20/2021 11:33:16 AM By: Kalman Shan DO Signed: 12/20/2021 1:50:30 PM By: Baruch Gouty RN, BSN Entered By: Kalman Shan on 12/20/2021 11:22:22 -------------------------------------------------------------------------------- SuperBill Details Patient Name: Date of Service: Jennifer [name] 12/20/2021 Medical Record Number: [id number] Patient Account Number: [id number] Date of Birth/Sex: Treating RN: 1961-01-31 (60 y.o. [name] Primary Care Provider: [name] Other Clinician: Referring Provider: Treating Provider/Extender: [name] in Treatment: 15 Diagnosis Coding ICD-10 Codes Code Description L84.536I Burn of second degree of right hand, unspecified site, initial encounter L98.498 Non-pressure chronic ulcer of skin of other sites with other specified severity E11.622 Type  2 diabetes mellitus with other skin ulcer I73.9 Peripheral vascular disease, unspecified W80.32 Chronic diastolic (congestive) heart failure Z89.512 Acquired absence of left leg below knee Z89.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) Z79.891 Long term (current) use of opiate analgesic Facility Procedures CPT4 Code: [id number] Description: [facility] - DEB SUBQ TISSUE 20 SQ CM/< ICD-10 Diagnosis Description T23.201A Burn of second degree of right hand, unspecified site, initial encounter Modifier: Quantity: 1 Physician Procedures : CPT4 Code Description Modifier [id number] - WC PHYS LEVEL 3 - EST PT ICD-10 Diagnosis Description T23.201A Burn of second degree of right hand, unspecified site, initial encounter L98.498 Non-pressure chronic ulcer of skin of other sites with other  specified severity E11.622 Type 2 diabetes mellitus with other skin ulcer Quantity: 1 : [id number] - WC PHYS SUBQ TISS 20 SQ CM ICD-10 Diagnosis Description T23.201A Burn of second degree of right hand, unspecified site, initial encounter Quantity: 1 Electronic Signature(s) Signed: 12/20/2021 11:33:16 AM By: Kalman Shan DO Entered By: Kalman Shan on 12/20/2021 11:32:49

## 2021-12-26 ENCOUNTER — Ambulatory Visit: Payer: Commercial Managed Care - HMO | Admitting: Behavioral Health

## 2021-12-26 ENCOUNTER — Ambulatory Visit (INDEPENDENT_AMBULATORY_CARE_PROVIDER_SITE_OTHER): Payer: Commercial Managed Care - HMO | Admitting: Dietician

## 2021-12-26 ENCOUNTER — Ambulatory Visit (INDEPENDENT_AMBULATORY_CARE_PROVIDER_SITE_OTHER): Payer: Commercial Managed Care - HMO | Admitting: Internal Medicine

## 2021-12-26 ENCOUNTER — Encounter: Payer: Self-pay | Admitting: Dietician

## 2021-12-26 VITALS — BP 133/53 | HR 74 | Temp 98.2°F | Ht 65.0 in | Wt 243.9 lb

## 2021-12-26 DIAGNOSIS — I5032 Chronic diastolic (congestive) heart failure: Secondary | ICD-10-CM

## 2021-12-26 DIAGNOSIS — E1142 Type 2 diabetes mellitus with diabetic polyneuropathy: Secondary | ICD-10-CM

## 2021-12-26 DIAGNOSIS — E118 Type 2 diabetes mellitus with unspecified complications: Secondary | ICD-10-CM | POA: Diagnosis not present

## 2021-12-26 DIAGNOSIS — E785 Hyperlipidemia, unspecified: Secondary | ICD-10-CM

## 2021-12-26 DIAGNOSIS — F419 Anxiety disorder, unspecified: Secondary | ICD-10-CM

## 2021-12-26 DIAGNOSIS — R269 Unspecified abnormalities of gait and mobility: Secondary | ICD-10-CM

## 2021-12-26 DIAGNOSIS — N3946 Mixed incontinence: Secondary | ICD-10-CM

## 2021-12-26 DIAGNOSIS — R634 Abnormal weight loss: Secondary | ICD-10-CM

## 2021-12-26 DIAGNOSIS — I872 Venous insufficiency (chronic) (peripheral): Secondary | ICD-10-CM

## 2021-12-26 DIAGNOSIS — Z89612 Acquired absence of left leg above knee: Secondary | ICD-10-CM

## 2021-12-26 DIAGNOSIS — R1032 Left lower quadrant pain: Secondary | ICD-10-CM | POA: Diagnosis not present

## 2021-12-26 DIAGNOSIS — F331 Major depressive disorder, recurrent, moderate: Secondary | ICD-10-CM

## 2021-12-26 DIAGNOSIS — Z89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): Secondary | ICD-10-CM

## 2021-12-26 DIAGNOSIS — Z794 Long term (current) use of insulin: Secondary | ICD-10-CM

## 2021-12-26 DIAGNOSIS — E1169 Type 2 diabetes mellitus with other specified complication: Secondary | ICD-10-CM

## 2021-12-26 DIAGNOSIS — Z720 Tobacco use: Secondary | ICD-10-CM

## 2021-12-26 DIAGNOSIS — R251 Tremor, unspecified: Secondary | ICD-10-CM

## 2021-12-26 MED ORDER — FUROSEMIDE 20 MG PO TABS: 40.0000 mg | ORAL_TABLET | Freq: Every day | ORAL | 3 refills | Status: DC

## 2021-12-26 NOTE — Patient Instructions (Signed)
Increasing Percocet to 5 mg 4 times a day  Adding lasix 20 mg two pills each day for now  Return in 1 month or closest possible  We'll talk about increasing Ozempic next time ! :)

## 2021-12-26 NOTE — Patient Instructions (Addendum)
Good job lowering your blood sugars.   Continue to use your Continuous glucose monitoring.   I will ask/make a referral to pharmacy to help with quitting smoking. (Dr. Hughes Better)  Referral to social work for assistance with wheelchair funds. Milus Height)  Watch out for low blood sugars if your Ozempic dose increases.   Call the office if you have low blood sugar and think your insulin needs to be adjusted.   Let's follow up in 2 months- March 2023.  Jennifer [name] [phone]
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
## 2021-12-26 NOTE — BH Specialist Note (Signed)
Integrated Behavioral Health via Telemedicine Visit  12/26/2021 Jennifer [name] [id number]  Number of [place] visits: 2/6 Session Start time: 9:10am  Session End time: 9:40am Total time: 30  Referring Provider: Dr. Lenise Herald, MD Patient/Family location: Pt is @ [facility] in an exam room @ ext. [phone] [facility] Provider location: Working remotely All persons participating in visit: Pt & Clinician Types of Service: Individual psychotherapy  I connected with Jennifer [name] and/or Jennifer [name]  self  via  Telephone or Video Enabled Telemedicine Application  (Video is Caregility application) and verified that I am speaking with the correct person using two identifiers. Discussed confidentiality:  2nd visit  I discussed the limitations of telemedicine and the availability of in person appointments.  Discussed there is a possibility of technology failure and discussed alternative modes of communication if that failure occurs.  I discussed that engaging in this telemedicine visit, they consent to the provision of behavioral healthcare and the services will be billed under their insurance.  Patient and/or legal guardian expressed understanding and consented to Telemedicine visit:  2nd visit  Presenting Concerns: Patient and/or family reports the following symptoms/concerns: reduced concerns for Jennifer [name] who is now off life-support & home again w/Pt-she is very relieved. Family has been upsetting as they have not reached out to her all [place]. She is trying to reconcile this lack of contact & not be disappointed.   Family has been difficult to navigate w/much understanding these past few months. Pt is trying to understand the perspective of her Adult Children.  Duration of problem: years; Severity of problem: moderate  Patient and/or Family's Strengths/Protective Factors: Social and Emotional competence, Concrete supports in place (healthy food, safe environments,  etc.), and Parental Resilience  Goals Addressed: Patient will:  Reduce symptoms of: anxiety, depression, and stress   Increase knowledge and/or ability of: coping skills and stress reduction   Demonstrate ability to: Increase healthy adjustment to current life circumstances  Progress towards Goals: Ongoing  Interventions: Interventions utilized:  Solution-Focused Strategies and Supportive Counseling Standardized Assessments completed:  screeners prn  Patient and/or Family Response: Pt is receptive to call & apologized for not retrieving her msg on the phone. Her transportation was on its way this morning & she was unable to cancel. Pt requests future appt to further discuss Family issues.   Assessment: Patient currently experiencing dec in anx/dep due to improved health of Son Jennifer [name] who was hosp'd on life support over the Christmas Holiday. He has since been d/c'd home into the care of his Mother.   Son is considering how/when to RTW as a Scientist, water quality @ [employer]. Pt needs Son to work as she cannot support him on her Disability benefits, but would never deny him shelter.  Patient may benefit from cont'd ck-ins for mental health wellness.  Plan: Follow up with behavioral health clinician on : 2-3 wks on telehealth for 60 min Behavioral recommendations: None today Referral(s): [place] (In Clinic)  I discussed the assessment and treatment plan with the patient and/or parent/guardian. They were provided an opportunity to ask questions and all were answered. They agreed with the plan and demonstrated an understanding of the instructions.   They were advised to call back or seek an in-person evaluation if the symptoms worsen or if the condition fails to improve as anticipated.  [name], LMFT

## 2021-12-26 NOTE — Progress Notes (Signed)
Medical Nutrition Therapy Start time: 1275  End time1140 total time 19 minutes   Assessment:  Jennifer [name] presents today for assistance with Continuous glucose monitoring and diabetes control. She is walking with her walker.   Today's CGM report did not show any hypoglycemia. .Blood sugars are decreasing compared to prior visit. She has been taking 70 units [place] daily since her last visit. She told her doctor that she has reduced her soda intake.  She thinks her blood sugars are improving with decreased stress.  Still plans to quit smoking in 2023. She agree to referral to pharmacy for assistance. She would  [place] like a referral to social work to se if she can help her find funds for a new wheelchair.   Medications: 3+ loose stools per day for last week, ozempic talked to her doctor about increasing it. Blood sugar is 182 mg/dl right now CGM Results from download: 11/27/21 through 12/11/21  % Time CGM active:   52 %   (Goal >70%)  Average glucose:   224 mg/dL for 14 days  Glucose management indicator:   8.7 %  Time in range (70-180 mg/dL):   36 %   (Goal >70%)  Time High (181-250 mg/dL):   31 %   (Goal < 25%)  Time Very High (>250 mg/dL):    33 %   (Goal < 5%)  Time Low (54-69 mg/dL):   0 %   (Goal <4%)  Time Very Low (<54 mg/dL):   0 %   (Goal <1%)  Coefficient of variation:   35.5 %   (Goal <36%)    Lab Results  Component Value Date   HGBA1C 7.7 (A) 12/04/2021   HGBA1C 7.0 (A) 09/19/2021   HGBA1C 7.4 (A) 07/11/2021   HGBA1C 8.0 (H) 03/04/2021   HGBA1C 7.8 (A) 01/17/2021     Estimated body mass index is 40.59 kg/m as calculated from the following:   Height as of an earlier encounter on 12/26/21: 5\' 5"  (1.651 m).   Weight as of an earlier encounter on 12/26/21: 243 lb 14.4 oz (110.6 kg).  Wt Readings from Last 10 Encounters:  12/26/21 243 lb 14.4 oz (110.6 kg)  12/11/21 241 lb (109.3 kg)  12/04/21 237 lb 3.2 oz (107.6 kg)  11/12/21 236 lb (107 kg)  10/27/21 239 lb 13.8 oz (108.8  kg)  10/17/21 228 lb 8 oz (103.6 kg)  10/10/21 242 lb 11.2 oz (110.1 kg)  10/03/21 232 lb 3.2 oz (105.3 kg)  09/19/21 237 lb 14.4 oz (107.9 kg)  08/16/21 248 lb (112.5 kg)    Progress Towards Goal(s):  Some progress.   Nutritional Diagnosis:  NB-1.4 Self-monitoring deficit As related to not checking blood sugar when CGM stopped.  As evidenced by her report.    Intervention:  nutrition counseling and support, assisted with appointment with doctor to adjust hr insulin Coordination of care: discussed with her doctor today  Teaching Method Utilized: Visual, Auditory,Hands on Handouts given during visit include: After visit summary Barriers to learning/adherence to lifestyle change: competing values Demonstrated degree of understanding via:  Teach Back   Monitoring/Evaluation:  Dietary intake, exercise, cgm reader, and body weight in 4 week(s).  [name] Rafay Dahan, RD 12/26/2021 11:21 AM.   .

## 2021-12-27 ENCOUNTER — Encounter (HOSPITAL_BASED_OUTPATIENT_CLINIC_OR_DEPARTMENT_OTHER): Payer: Commercial Managed Care - HMO | Admitting: Internal Medicine

## 2021-12-28 NOTE — Addendum Note (Signed)
Addended by: [name] A on: 12/28/2021 02:49 PM   Modules accepted: Orders

## 2022-01-01 ENCOUNTER — Other Ambulatory Visit: Payer: Self-pay | Admitting: Internal Medicine

## 2022-01-01 ENCOUNTER — Other Ambulatory Visit: Payer: Self-pay

## 2022-01-01 MED ORDER — OZEMPIC (0.25 OR 0.5 MG/DOSE) 2 MG/1.5ML ~~LOC~~ SOPN: 0.5000 mg | PEN_INJECTOR | SUBCUTANEOUS | 5 refills | Status: DC

## 2022-01-01 MED ORDER — OXYCODONE-ACETAMINOPHEN 5-325 MG PO TABS: 1.0000 | ORAL_TABLET | Freq: Four times a day (QID) | ORAL | 0 refills | Status: DC | PRN

## 2022-01-03 ENCOUNTER — Encounter (HOSPITAL_BASED_OUTPATIENT_CLINIC_OR_DEPARTMENT_OTHER): Payer: Medicare Other | Attending: Internal Medicine | Admitting: Internal Medicine

## 2022-01-03 ENCOUNTER — Other Ambulatory Visit: Payer: Self-pay

## 2022-01-03 DIAGNOSIS — Z794 Long term (current) use of insulin: Secondary | ICD-10-CM | POA: Insufficient documentation

## 2022-01-03 DIAGNOSIS — L98498 Non-pressure chronic ulcer of skin of other sites with other specified severity: Secondary | ICD-10-CM | POA: Diagnosis present

## 2022-01-03 DIAGNOSIS — E1151 Type 2 diabetes mellitus with diabetic peripheral angiopathy without gangrene: Secondary | ICD-10-CM | POA: Insufficient documentation

## 2022-01-03 DIAGNOSIS — F17200 Nicotine dependence, unspecified, uncomplicated: Secondary | ICD-10-CM | POA: Insufficient documentation

## 2022-01-03 DIAGNOSIS — T23201A Burn of second degree of right hand, unspecified site, initial encounter: Secondary | ICD-10-CM | POA: Diagnosis not present

## 2022-01-03 DIAGNOSIS — Z89512 Acquired absence of left leg below knee: Secondary | ICD-10-CM | POA: Diagnosis not present

## 2022-01-03 DIAGNOSIS — E11622 Type 2 diabetes mellitus with other skin ulcer: Secondary | ICD-10-CM | POA: Diagnosis not present

## 2022-01-03 DIAGNOSIS — X58XXXA Exposure to other specified factors, initial encounter: Secondary | ICD-10-CM | POA: Insufficient documentation

## 2022-01-03 DIAGNOSIS — Z79891 Long term (current) use of opiate analgesic: Secondary | ICD-10-CM | POA: Insufficient documentation

## 2022-01-03 DIAGNOSIS — I5032 Chronic diastolic (congestive) heart failure: Secondary | ICD-10-CM | POA: Insufficient documentation

## 2022-01-03 DIAGNOSIS — Z89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): Secondary | ICD-10-CM | POA: Diagnosis not present

## 2022-01-03 NOTE — Telephone Encounter (Signed)
Rx sent to [facility] on 01/01/22 with receipt confirmed by pharmacy. 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

## 2022-01-03 NOTE — Telephone Encounter (Signed)
Pt would like to know if oxyCODONE-acetaminophen (PERCOCET/ROXICET) 5-325 MG tablet has been approved by the doctor. Please call pt back.

## 2022-01-07 NOTE — Assessment & Plan Note (Signed)
Wound has nearly completely healed; continues to f/u with Dr. Amalia Hailey, podiatry.

## 2022-01-07 NOTE — Assessment & Plan Note (Signed)
Plan to increase GLP1 agonist at next visit - ran out of time today.

## 2022-01-07 NOTE — Assessment & Plan Note (Signed)
No tremor at today's visit.  Monitor.

## 2022-01-07 NOTE — Assessment & Plan Note (Signed)
LDL 43 in 02/2021.  Continue current therapy.
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
## 2022-01-07 NOTE — Assessment & Plan Note (Signed)
Appeal for motorized WC denied.  Communicated with PT; we will reevaluate Jennifer [name] and submit order for a chair with different features.

## 2022-01-07 NOTE — Assessment & Plan Note (Signed)
Biotech prosthesis appt yesterday

## 2022-01-07 NOTE — Assessment & Plan Note (Signed)
Agrees to start lasix due to marked edema RLE.  Her incontinence has persisted despite all available therapies at [facility].  SHe is trying to focus on managing the problem with incontinence products and pads, though this is very cumbersome and interfering.

## 2022-01-07 NOTE — Assessment & Plan Note (Signed)
SHe is in beginning stages of contemplation!  I'll continue to support her but will not push.

## 2022-01-07 NOTE — Assessment & Plan Note (Signed)
Weight is increasing despite restarting GLP1 agonist.  Will increase dose at next visit, and will address dependent volume with lasix.

## 2022-01-08 ENCOUNTER — Other Ambulatory Visit: Payer: Self-pay | Admitting: Internal Medicine

## 2022-01-08 DIAGNOSIS — R269 Unspecified abnormalities of gait and mobility: Secondary | ICD-10-CM

## 2022-01-08 DIAGNOSIS — Z89612 Acquired absence of left leg above knee: Secondary | ICD-10-CM

## 2022-01-08 NOTE — Progress Notes (Signed)
Jennifer [name] ([id number]) Visit Report for 01/03/2022 Chief Complaint Document Details Patient Name: Date of Service: Jennifer [name], Jennifer [name] 01/03/2022 10:00 A M Medical Record Number: [id number] Patient Account Number: [id number] Date of Birth/Sex: Treating RN: [birth year] (61 y.o. Elam Dutch Primary Care Provider: [name] Other Clinician: Referring Provider: Treating Provider/Extender: [name] in Treatment: 17 Information Obtained from: Patient Chief Complaint 10/29/2021; patient presents for area of skin breakdown to her right lower extremity 12/30; patient presents for burn to her right second finger Electronic Signature(s) Signed: 01/03/2022 11:23:37 AM By: Kalman Shan DO Entered By: Kalman Shan on 01/03/2022 11:17:16 -------------------------------------------------------------------------------- Debridement Details Patient Name: Date of Service: Jennifer Reader L. 01/03/2022 10:00 [place] Record Number: [id number] Patient Account Number: [id number] Date of Birth/Sex: Treating RN: 12/19/1961 (61 y.o. Nancy Fetter Primary Care Provider: [name] Other Clinician: Referring Provider: Treating Provider/Extender: [name] in Treatment: 17 Debridement Performed for Assessment: Wound #24 Right Hand - 2nd Digit Performed By: Physician Kalman Shan, DO Debridement Type: Debridement Level of Consciousness (Pre-procedure): Awake and Alert Pre-procedure Verification/Time Out Yes - 11:04 Taken: Start Time: 11:04 T Area Debrided (L x W): otal 0.9 (cm) x 0.7 (cm) = 0.63 (cm) Tissue and other material debrided: Non-Viable, [place] Level: Non-Viable Tissue Debridement Description: Selective/Open Wound Instrument: Curette Bleeding: Minimum Hemostasis Achieved: Pressure End Time: 11:05 Procedural Pain: 0 Post Procedural Pain: 0 Response to Treatment: Procedure was tolerated  well Level of Consciousness (Post- Awake and Alert procedure): Post Debridement Measurements of Total Wound Length: (cm) 0.9 Width: (cm) 0.7 Depth: (cm) 0.1 Volume: (cm) 0.049 Character of Wound/Ulcer Post Debridement: Requires Further Debridement Post Procedure Diagnosis Same as Pre-procedure Electronic Signature(s) Signed: 01/03/2022 11:23:37 AM By: Kalman Shan DO Signed: 01/08/2022 5:10:09 PM By: Levan Hurst RN, BSN Entered By: Levan Hurst on 01/03/2022 11:05:32 -------------------------------------------------------------------------------- HPI Details Patient Name: Date of Service: Jennifer Reader L. 01/03/2022 10:00 A M Medical Record Number: [id number] Patient Account Number: [id number] Date of Birth/Sex: Treating RN: 07/26/1961 (61 y.o. Elam Dutch Primary Care Provider: [name] Other Clinician: Referring Provider: Treating Provider/Extender: [name] in Treatment: 17 History of Present Illness HPI Description: Admission 10/29/2021 [name] is a 61 year old female with a past medical history of insulin-dependent type 2 diabetes, left lower extremity BKA, right partial fifth ray amputation that presents today for a small open wound to her right lower extremity. She was recently hospitalized on 10/24/2021 for acute on chronic HFpEF. She was noted to Have a small open wound with weeping to her right lower extremity. She was diuresed and also placed in an Haematologist. She reports no issues since discharge. She states that the swelling on her right lower extremity has improved. She currently denies signs of infection. 11/15; patient presents for follow-up. She has no issues or complaints today. She reports her wound is healed and has her compression stocking today. Readmission 12/20/2021 Patient presents with a new wound to her right second finger caused by a burn 1 month ago. She has been using antibiotic ointment on the  site. She reports no improvement in wound healing over the past month. She currently denies signs of infection. 01/03/2022; patient presents for follow-up. She was able to obtain Santyl and states she is using this with Hydrofera Blue daily. She has no issues or complaints today. She denies signs of infection. Electronic Signature(s) Signed: 01/03/2022 11:23:37 AM By: Kalman Shan DO  Entered By: Kalman Shan on 01/03/2022 11:17:45 -------------------------------------------------------------------------------- Physical Exam Details Patient Name: Date of Service: Jennifer [name] 01/03/2022 10:00 A M Medical Record Number: [id number] Patient Account Number: [id number] Date of Birth/Sex: Treating RN: 06-May-1961 (61 y.o. Elam Dutch Primary Care Provider: [name] Other Clinician: Referring Provider: Treating Provider/Extender: [name] in Treatment: 17 Constitutional respirations regular, non-labored and within target range for patient.Marland Kitchen Psychiatric pleasant and cooperative. Notes Right second finger with open wound and mostly nonviable tissue with some granulation tissue present. No signs of surrounding infection. Electronic Signature(s) Signed: 01/03/2022 11:23:37 AM By: Kalman Shan DO Entered By: Kalman Shan on 01/03/2022 11:20:11 -------------------------------------------------------------------------------- Physician Orders Details Patient Name: Date of Service: Jennifer Reader L. 01/03/2022 10:00 A M Medical Record Number: [id number] Patient Account Number: [id number] Date of Birth/Sex: Treating RN: 04-18-1961 (61 y.o. Nancy Fetter Primary Care Provider: [name] Other Clinician: Referring Provider: Treating Provider/Extender: [name] in Treatment: 17 Verbal / Phone Orders: No Diagnosis Coding ICD-10 Coding Code Description Q22.297L Burn of second degree of right  hand, unspecified site, initial encounter L98.498 Non-pressure chronic ulcer of skin of other sites with other specified severity E11.622 Type 2 diabetes mellitus with other skin ulcer I73.9 Peripheral vascular disease, unspecified G92.11 Chronic diastolic (congestive) heart failure Z89.512 Acquired absence of left leg below knee Z89.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) Z79.891 Long term (current) use of opiate analgesic Follow-up Appointments Return appointment in 3 weeks. - Dr. Heber Giles Bathing/ Shower/ Hygiene May shower and wash wound with soap and water. - when changing dressing Additional Orders / Instructions Follow Nutritious Diet Wound Treatment Wound #24 - Hand - 2nd Digit Wound Laterality: Right Cleanser: Soap and Water 1 x Per Day/30 Days Discharge Instructions: May shower and wash wound with dial antibacterial soap and water prior to dressing change. Prim Dressing: Hydrofera Blue Ready Foam, 2.5 x2.5 in 1 x Per Day/30 Days ary Discharge Instructions: Apply to wound bed as instructed Prim Dressing: Santyl Ointment 1 x Per Day/30 Days ary Discharge Instructions: Apply nickel thick amount to wound bed as instructed Secondary Dressing: Woven Gauze Sponge, Non-Sterile 4x4 in 1 x Per Day/30 Days Discharge Instructions: Apply over primary dressing as directed. Secured With: 58M Medipore H Soft Cloth Surgical T ape, 4 x 10 (in/yd) 1 x Per Day/30 Days Discharge Instructions: Secure with tape as directed. Secured With: Netting 1 x Per Day/30 Days Electronic Signature(s) Signed: 01/03/2022 11:23:37 AM By: Kalman Shan DO Signed: 01/08/2022 5:10:09 PM By: Levan Hurst RN, BSN Entered By: Levan Hurst on 01/03/2022 11:06:35 -------------------------------------------------------------------------------- Problem List Details Patient Name: Date of Service: Jennifer Reader L. 01/03/2022 10:00 A M Medical Record Number: [id number] Patient Account Number: [id number] Date of  Birth/Sex: Treating RN: 1961/05/10 (61 y.o. [name], Tammi Klippel Primary Care Provider: [name] Other Clinician: Referring Provider: Treating Provider/Extender: [name] in Treatment: 17 Active Problems ICD-10 Encounter Code Description Active Date MDM Diagnosis T23.201A Burn of second degree of right hand, unspecified site, initial encounter 12/20/2021 No Yes L98.498 Non-pressure chronic ulcer of skin of other sites with other specified severity 12/20/2021 No Yes E11.622 Type 2 diabetes mellitus with other skin ulcer 09/03/2021 No Yes I73.9 Peripheral vascular disease, unspecified 09/03/2021 No Yes G81.85 Chronic diastolic (congestive) heart failure 09/03/2021 No Yes Z89.512 Acquired absence of left leg below knee 09/03/2021 No Yes Z89.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) 09/03/2021 No Yes Z79.891 Long term (current) use  of opiate analgesic 09/03/2021 No Yes Inactive Problems ICD-10 Code Description Active Date Inactive Date S91.301A Unspecified open wound, right foot, initial encounter 09/03/2021 09/03/2021 L97.811 Non-pressure chronic ulcer of other part of right lower leg limited to breakdown of skin 10/29/2021 10/29/2021 Resolved Problems ICD-10 Code Description Active Date Resolved Date E11.621 Type 2 diabetes mellitus with foot ulcer 09/03/2021 09/03/2021 S91.301D Unspecified open wound, right foot, subsequent encounter 09/10/2021 09/10/2021 Electronic Signature(s) Signed: 01/03/2022 11:23:37 AM By: Kalman Shan DO Entered By: Kalman Shan on 01/03/2022 11:16:47 -------------------------------------------------------------------------------- Progress Note Details Patient Name: Date of Service: Jennifer Reader L. 01/03/2022 10:00 A M Medical Record Number: [id number] Patient Account Number: [id number] Date of Birth/Sex: Treating RN: Jan 17, 1961 (60 y.o. Elam Dutch Primary Care Provider: [name] Other Clinician: Referring  Provider: Treating Provider/Extender: [name] in Treatment: 29 Subjective Chief Complaint Information obtained from Patient 10/29/2021; patient presents for area of skin breakdown to her right lower extremity 12/30; patient presents for burn to her right second finger History of Present Illness (HPI) Admission 10/29/2021 Ms. Jaquasia Doscher is a 61 year old female with a past medical history of insulin-dependent type 2 diabetes, left lower extremity BKA, right partial fifth ray amputation that presents today for a small open wound to her right lower extremity. She was recently hospitalized on 10/24/2021 for acute on chronic HFpEF. She was noted to Have a small open wound with weeping to her right lower extremity. She was diuresed and also placed in an Haematologist. She reports no issues since discharge. She states that the swelling on her right lower extremity has improved. She currently denies signs of infection. 11/15; patient presents for follow-up. She has no issues or complaints today. She reports her wound is healed and has her compression stocking today. Readmission 12/20/2021 Patient presents with a new wound to her right second finger caused by a burn 1 month ago. She has been using antibiotic ointment on the site. She reports no improvement in wound healing over the past month. She currently denies signs of infection. 01/03/2022; patient presents for follow-up. She was able to obtain Santyl and states she is using this with Hydrofera Blue daily. She has no issues or complaints today. She denies signs of infection. Patient History Information obtained from Patient. Family History Diabetes - Mother, Heart Disease - Mother, Hypertension - Mother, Seizures - Siblings, Stroke - Siblings, No family history of Cancer, Hereditary Spherocytosis, Kidney Disease, Lung Disease, Thyroid Problems, Tuberculosis. Social History Current every day smoker, Marital Status -  Divorced, Alcohol Use - Never, Drug Use - No History, Caffeine Use - Moderate. Medical History Eyes Patient has history of Cataracts, Glaucoma - due to [place] Denies history of Optic Neuritis Ear/Nose/Mouth/Throat Patient has history of Chronic sinus problems/congestion - Flonase Denies history of Middle ear problems Hematologic/Lymphatic Patient has history of Anemia Denies history of Hemophilia, Human Immunodeficiency Virus, Lymphedema, Sickle Cell Disease Respiratory Patient has history of Asthma, Chronic Obstructive Pulmonary Disease (COPD) Denies history of Aspiration Cardiovascular Patient has history of Congestive Heart Failure - chronic diastolic, Hypertension, Peripheral Arterial Disease, 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 Denies history of Angina, Arrhythmia, Coronary Artery Disease, Deep Vein Thrombosis, Hypotension, Myocardial Infarction, Phlebitis, Vasculitis Gastrointestinal Denies history of Cirrhosis , Colitis, Crohnoos, Hepatitis A, Hepatitis B, Hepatitis C Endocrine Patient has history of Type II Diabetes - uncontrolled w/ side effects Denies history of Type I Diabetes Genitourinary Denies history of End Stage Renal Disease Immunological Denies history of Lupus Erythematosus, Raynaudoos,  Scleroderma Integumentary (Skin) Denies history of History of Burn Musculoskeletal Patient has history of Rheumatoid Arthritis, Osteoarthritis - spine, Osteomyelitis - hx left foot Denies history of Gout Neurologic Patient has history of Neuropathy, Seizure Disorder Denies history of Dementia, Quadriplegia, Paraplegia Oncologic Denies history of Received Chemotherapy, Received Radiation Psychiatric Denies history of Anorexia/bulimia, Confinement Anxiety Hospitalization/Surgery History - left leg amputated. - COPD, PNA. 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 Medical A Surgical History Notes nd Constitutional Symptoms (General Health) chronic insomnia , opioid dependence ,  gait abnormality , severe obesity , s/p BKA , Eyes diabetic retinopathy Ear/Nose/Mouth/Throat oral mucosa lesions Hematologic/Lymphatic thrombocytopenia Cardiovascular hyperlipidemia , infective endocarditis , aortic atherosclerosis , Gastrointestinal GERD Integumentary (Skin) foot ulcer Neurologic childhood seizures Hx Bells Palsy Psychiatric depression Objective Constitutional respirations regular, non-labored and within target range for patient.. Vitals Time Taken: 10:37 AM, Height: 67 in, Weight: 240 lbs, BMI: 37.6, Temperature: 98.7 F, Pulse: 76 bpm, Respiratory Rate: 20 breaths/min, Blood Pressure: 133/73 mmHg, Capillary Blood Glucose: 101 mg/dl. Psychiatric pleasant and cooperative. General Notes: Right second finger with open wound and mostly nonviable tissue with some granulation tissue present. No signs of surrounding infection. Integumentary (Hair, Skin) Wound #24 status is Open. Original cause of wound was Blister. The date acquired was: 11/19/2021. The wound has been in treatment 2 weeks. The wound is located on the Right Hand - 2nd Digit. The wound measures 0.9cm length x 0.7cm width x 0.1cm depth; 0.495cm^2 area and 0.049cm^3 volume. There is Fat Layer (Subcutaneous Tissue) exposed. There is a medium amount of serosanguineous drainage noted. The wound margin is distinct with the outline attached to the wound base. There is medium (34-66%) pink granulation within the wound bed. There is a medium (34-66%) amount of necrotic tissue within the wound bed including Adherent Slough. Assessment Active Problems ICD-10 Burn of second degree of right hand, unspecified site, initial encounter Non-pressure chronic ulcer of skin of other sites with other specified severity Type 2 diabetes mellitus with other skin ulcer Peripheral vascular disease, unspecified Chronic diastolic (congestive) heart failure Acquired absence of left leg below knee Acquired absence of other right  toe(s) Long term (current) use of opiate analgesic Patient's wound has shown improvement in size and appearance since last clinic visit. I debrided nonviable tissue. No signs of infection on exam. I recommended continuing with Santyl and Hydrofera Blue. Follow-up in 3 weeks Procedures Wound #24 Pre-procedure diagnosis of Wound #24 is a 2nd degree Burn located on the Right Hand - 2nd Digit . There was a Selective/Open Wound Non-Viable Tissue Debridement with a total area of 0.63 sq cm performed by Kalman Shan, DO. With the following instrument(s): Curette to remove Non-Viable tissue/material. Material removed includes [place]. No specimens were taken. A time out was conducted at 11:04, prior to the start of the procedure. A Minimum amount of bleeding was controlled with Pressure. The procedure was tolerated well with a pain level of 0 throughout and a pain level of 0 following the procedure. Post Debridement Measurements: 0.9cm length x 0.7cm width x 0.1cm depth; 0.049cm^3 volume. Character of Wound/Ulcer Post Debridement requires further debridement. Post procedure Diagnosis Wound #24: Same as Pre-Procedure Plan Follow-up Appointments: Return appointment in 3 weeks. - Dr. Heber Shipman Bathing/ Shower/ Hygiene: May shower and wash wound with soap and water. - when changing dressing Additional Orders / Instructions: Follow Nutritious Diet WOUND #24: - Hand - 2nd Digit Wound Laterality: Right Cleanser: Soap and Water 1 x Per Day/30 Days Discharge Instructions: May shower and  wash wound with dial antibacterial soap and water prior to dressing change. Prim Dressing: Hydrofera Blue Ready Foam, 2.5 x2.5 in 1 x Per Day/30 Days ary Discharge Instructions: Apply to wound bed as instructed Prim Dressing: Santyl Ointment 1 x Per Day/30 Days ary Discharge Instructions: Apply nickel thick amount to wound bed as instructed Secondary Dressing: Woven Gauze Sponge, Non-Sterile 4x4 in 1 x Per Day/30  Days Discharge Instructions: Apply over primary dressing as directed. Secured With: 109M Medipore H Soft Cloth Surgical T ape, 4 x 10 (in/yd) 1 x Per Day/30 Days Discharge Instructions: Secure with tape as directed. Secured With: Netting 1 x Per Day/30 Days 1. In office sharp debridement 2. Hydrofera Blue and Santyl 3. Follow-up in 3 weeks Electronic Signature(s) Signed: 01/03/2022 11:23:37 AM By: Kalman Shan DO Entered By: Kalman Shan on 01/03/2022 11:20:28 -------------------------------------------------------------------------------- HxROS Details Patient Name: Date of Service: Jennifer Reader L. 01/03/2022 10:00 A M Medical Record Number: [id number] Patient Account Number: [id number] Date of Birth/Sex: Treating RN: 1961/01/29 (61 y.o. 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 Negative for: Optic Neuritis Past Medical History Notes: diabetic retinopathy Ear/Nose/Mouth/Throat Medical History: Positive for: Chronic sinus problems/congestion - Flonase Negative for: Middle ear problems Past Medical History Notes: oral mucosa lesions Hematologic/Lymphatic Medical History: Positive for: Anemia Negative for: Hemophilia; Human Immunodeficiency Virus; Lymphedema; Sickle Cell Disease Past Medical History Notes: thrombocytopenia Respiratory Medical History: Positive for: Asthma; Chronic Obstructive Pulmonary Disease (COPD) Negative for: Aspiration Cardiovascular Medical History: Positive for: Congestive Heart Failure - chronic diastolic; Hypertension;  Peripheral Arterial Disease; 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 Negative for: Angina; Arrhythmia; Coronary Artery Disease; Deep Vein Thrombosis; Hypotension; Myocardial Infarction; Phlebitis; Vasculitis Past Medical History Notes: hyperlipidemia , infective endocarditis , aortic atherosclerosis , Gastrointestinal Medical History: Negative for: Cirrhosis ; Colitis; Crohns; Hepatitis A; Hepatitis B; Hepatitis C Past Medical History Notes: GERD Endocrine Medical History: Positive for: Type II Diabetes - uncontrolled w/ side effects Negative for: Type I Diabetes Time with diabetes: 1993 Treated with: Insulin Blood sugar tested every day: Yes Tested : daily Genitourinary Medical History: Negative for: End Stage Renal Disease Immunological Medical History: Negative for: Lupus Erythematosus; Raynauds; Scleroderma Integumentary (Skin) Medical History: Negative for: History of Burn Past Medical History Notes: foot ulcer Musculoskeletal Medical History: Positive for: Rheumatoid Arthritis; Osteoarthritis - spine; Osteomyelitis - hx left foot Negative for: Gout Neurologic Medical History: Positive for: Neuropathy; Seizure Disorder Negative for: Dementia; Quadriplegia; Paraplegia Past Medical History Notes: childhood seizures Hx Bells Palsy Oncologic Medical History: Negative for: Received Chemotherapy; Received Radiation Psychiatric Medical History: Negative for: Anorexia/bulimia; Confinement Anxiety Past Medical History Notes: depression HBO Extended History Items Ear/Nose/Mouth/Throat: Eyes: Eyes: Chronic sinus Cataracts Glaucoma problems/congestion Immunizations Pneumococcal Vaccine: Received Pneumococcal Vaccination: Yes Received Pneumococcal Vaccination On or After 60th Birthday: No Immunization Notes: tetanus shot in 2014 Implantable Devices None Hospitalization / Surgery History Type of Hospitalization/Surgery left leg amputated COPD, PNA right foot 5th  toe amputation 2019 Family and Social History Cancer: No; Diabetes: Yes - Mother; Heart Disease: Yes - Mother; Hereditary Spherocytosis: No; Hypertension: Yes - Mother; Kidney Disease: No; Lung Disease: No; Seizures: Yes - Siblings; Stroke: Yes - Siblings; Thyroid Problems: No;  Tuberculosis: No; Current every day smoker; Marital Status - Divorced; Alcohol Use: Never; Drug Use: No History; Caffeine Use: Moderate; Financial Concerns: No; Food, Clothing or Shelter Needs: No; Support System Lacking: No; Transportation Concerns: No Electronic Signature(s) Signed: 01/03/2022 11:23:37 AM By: Kalman Shan DO Signed: 01/07/2022 11:39:47 AM By: Baruch Gouty RN, BSN Entered By: Kalman Shan on 01/03/2022 11:17:53 -------------------------------------------------------------------------------- SuperBill Details Patient Name: Date of Service: [name] 01/03/2022 Medical Record Number: [id number] Patient Account Number: [id number] Date of Birth/Sex: Treating RN: Dec 05, 1961 (61 y.o. Elam Dutch Primary Care Provider: [name] Other Clinician: Referring Provider: Treating Provider/Extender: [name] in Treatment: 17 Diagnosis Coding ICD-10 Codes Code Description G64.403K Burn of second degree of right hand, unspecified site, initial encounter L98.498 Non-pressure chronic ulcer of skin of other sites with other specified severity E11.622 Type 2 diabetes mellitus with other skin ulcer I73.9 Peripheral vascular disease, unspecified V42.59 Chronic diastolic (congestive) heart failure Z89.512 Acquired absence of left leg below knee Z89.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) Z79.891 Long term (current) use of opiate analgesic Facility Procedures CPT4 Code: [id number] Description: [phone] - DEBRIDE WOUND 1ST 20 SQ CM OR < ICD-10 Diagnosis Description L98.498 Non-pressure chronic ulcer of skin of other sites with other specified severity  T23.201A Burn of second degree of right hand, unspecified site, initial encounter  E11.622 Type 2 diabetes mellitus with other skin ulcer Modifier: Quantity: 1 Physician Procedures : CPT4 Code Description Modifier [id number] - WC PHYS DEBR WO ANESTH 20 SQ CM ICD-10 Diagnosis Description L98.498 Non-pressure chronic ulcer of skin of other sites with other specified severity T23.201A Burn of second degree of right hand,  unspecified site, initial encounter E11.622 Type 2 diabetes mellitus with other skin ulcer Quantity: 1 Electronic Signature(s) Signed: 01/03/2022 11:23:37 AM By: Kalman Shan DO Entered By: Kalman Shan on 01/03/2022 11:20:55

## 2022-01-08 NOTE — Progress Notes (Signed)
Order placed for PT reevaluation for power WC; the model ordered in 2022 was denied - and appeal denied by [facility].
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
## 2022-01-20 ENCOUNTER — Other Ambulatory Visit: Payer: Self-pay | Admitting: Internal Medicine

## 2022-01-20 ENCOUNTER — Encounter: Payer: Self-pay | Admitting: Internal Medicine

## 2022-01-20 ENCOUNTER — Ambulatory Visit (INDEPENDENT_AMBULATORY_CARE_PROVIDER_SITE_OTHER): Payer: Medicare Other | Admitting: Internal Medicine

## 2022-01-20 VITALS — BP 131/57 | HR 76 | Temp 98.4°F | Wt 239.8 lb

## 2022-01-20 DIAGNOSIS — L299 Pruritus, unspecified: Secondary | ICD-10-CM | POA: Diagnosis not present

## 2022-01-20 MED ORDER — HYDROXYZINE HCL 10 MG PO TABS: 10.0000 mg | ORAL_TABLET | Freq: Three times a day (TID) | ORAL | 0 refills | Status: DC | PRN
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
## 2022-01-20 NOTE — Assessment & Plan Note (Signed)
Patient presented with complaints of generalized pruritis for past several months that has recently worsened. No other associated symptoms. No new medications, foods, detergents, soaps. She states she does moisturize but that it does not relieve symptoms. She has also tried benadryl and otc anti-itch creams with no relief. No obviously inflamed skin, no rashes, insect bites. Patient has a hx of ckd, however, bmp obtained in December unremarkable. Will obtain CMP to evaluate for elevated bilirubin. Hydroxyzine TID as needed for itching.

## 2022-01-20 NOTE — Patient Instructions (Addendum)
Dear Mrs. Marrar,  Today we discussed your itching. I have sent in a prescription for hydroxyzine to take up to tree times daily as needed for itching. We will also check blood work today. Please make sure you are keeping your skin moisturized as well. Moisturizing soaps like Dove can be helpful.

## 2022-01-21 LAB — CMP14 + ANION GAP
ALT: 10 IU/L (ref 0–32)
AST: 14 IU/L (ref 0–40)
Albumin/Globulin Ratio: 1.7 (ref 1.2–2.2)
Albumin: 3.8 g/dL (ref 3.8–4.8)
Alkaline Phosphatase: 77 IU/L (ref 44–121)
Anion Gap: 11 mmol/L (ref 10.0–18.0)
BUN/Creatinine Ratio: 12 (ref 12–28)
BUN: 12 mg/dL (ref 8–27)
Bilirubin Total: <0.2 mg/dL (ref 0.0–1.2)
CO2: 27 mmol/L (ref 20–29)
Calcium: 9.1 mg/dL (ref 8.7–10.3)
Chloride: 107 mmol/L — ABNORMAL HIGH (ref 96–106)
Creatinine, Ser: 1.02 mg/dL — ABNORMAL HIGH (ref 0.57–1.00)
Globulin, Total: 2.3 g/dL (ref 1.5–4.5)
Glucose: 136 mg/dL — ABNORMAL HIGH (ref 70–99)
Potassium: 4 mmol/L (ref 3.5–5.2)
Sodium: 145 mmol/L — ABNORMAL HIGH (ref 134–144)
Total Protein: 6.1 g/dL (ref 6.0–8.5)
eGFR: 62 mL/min/{1.73_m2} (ref >59)

## 2022-01-22 ENCOUNTER — Institutional Professional Consult (permissible substitution): Payer: 59 | Admitting: Behavioral Health

## 2022-01-22 NOTE — Progress Notes (Signed)
Internal Medicine Clinic Attending ° °Case discussed with Dr. Gawaluck  At the time of the visit.  We reviewed the resident’s history and exam and pertinent patient test results.  I agree with the assessment, diagnosis, and plan of care documented in the resident’s note.  °

## 2022-01-24 ENCOUNTER — Other Ambulatory Visit: Payer: Self-pay

## 2022-01-24 ENCOUNTER — Ambulatory Visit (INDEPENDENT_AMBULATORY_CARE_PROVIDER_SITE_OTHER): Payer: Medicare Other | Admitting: Podiatry

## 2022-01-24 ENCOUNTER — Encounter (HOSPITAL_BASED_OUTPATIENT_CLINIC_OR_DEPARTMENT_OTHER): Payer: Medicare Other | Attending: Internal Medicine | Admitting: Internal Medicine

## 2022-01-24 ENCOUNTER — Encounter: Payer: Self-pay | Admitting: Podiatry

## 2022-01-24 DIAGNOSIS — G40909 Epilepsy, unspecified, not intractable, without status epilepticus: Secondary | ICD-10-CM | POA: Diagnosis not present

## 2022-01-24 DIAGNOSIS — I5032 Chronic diastolic (congestive) heart failure: Secondary | ICD-10-CM | POA: Insufficient documentation

## 2022-01-24 DIAGNOSIS — E785 Hyperlipidemia, unspecified: Secondary | ICD-10-CM | POA: Insufficient documentation

## 2022-01-24 DIAGNOSIS — L97912 Non-pressure chronic ulcer of unspecified part of right lower leg with fat layer exposed: Secondary | ICD-10-CM | POA: Insufficient documentation

## 2022-01-24 DIAGNOSIS — E1140 Type 2 diabetes mellitus with diabetic neuropathy, unspecified: Secondary | ICD-10-CM | POA: Insufficient documentation

## 2022-01-24 DIAGNOSIS — E1136 Type 2 diabetes mellitus with diabetic cataract: Secondary | ICD-10-CM | POA: Insufficient documentation

## 2022-01-24 DIAGNOSIS — B351 Tinea unguium: Secondary | ICD-10-CM

## 2022-01-24 DIAGNOSIS — Z89512 Acquired absence of left leg below knee: Secondary | ICD-10-CM | POA: Insufficient documentation

## 2022-01-24 DIAGNOSIS — E11622 Type 2 diabetes mellitus with other skin ulcer: Secondary | ICD-10-CM | POA: Insufficient documentation

## 2022-01-24 DIAGNOSIS — L98498 Non-pressure chronic ulcer of skin of other sites with other specified severity: Secondary | ICD-10-CM | POA: Diagnosis not present

## 2022-01-24 DIAGNOSIS — E1151 Type 2 diabetes mellitus with diabetic peripheral angiopathy without gangrene: Secondary | ICD-10-CM | POA: Insufficient documentation

## 2022-01-24 DIAGNOSIS — I110 Hypertensive heart disease with heart failure: Secondary | ICD-10-CM | POA: Insufficient documentation

## 2022-01-24 DIAGNOSIS — Z89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): Secondary | ICD-10-CM

## 2022-01-24 DIAGNOSIS — M069 Rheumatoid arthritis, unspecified: Secondary | ICD-10-CM | POA: Insufficient documentation

## 2022-01-24 DIAGNOSIS — L84 Corns and callosities: Secondary | ICD-10-CM

## 2022-01-24 DIAGNOSIS — Z6837 Body mass index (BMI) 37.0-37.9, adult: Secondary | ICD-10-CM | POA: Insufficient documentation

## 2022-01-24 DIAGNOSIS — J449 Chronic obstructive pulmonary disease, unspecified: Secondary | ICD-10-CM | POA: Diagnosis not present

## 2022-01-24 DIAGNOSIS — Z79891 Long term (current) use of opiate analgesic: Secondary | ICD-10-CM | POA: Insufficient documentation

## 2022-01-24 DIAGNOSIS — Z794 Long term (current) use of insulin: Secondary | ICD-10-CM | POA: Insufficient documentation

## 2022-01-24 DIAGNOSIS — F17200 Nicotine dependence, unspecified, uncomplicated: Secondary | ICD-10-CM | POA: Diagnosis not present

## 2022-01-24 DIAGNOSIS — K219 Gastro-esophageal reflux disease without esophagitis: Secondary | ICD-10-CM | POA: Insufficient documentation

## 2022-01-24 DIAGNOSIS — E0843 Diabetes mellitus due to underlying condition with diabetic autonomic (poly)neuropathy: Secondary | ICD-10-CM

## 2022-01-24 DIAGNOSIS — X088XXD Exposure to other specified smoke, fire and flames, subsequent encounter: Secondary | ICD-10-CM | POA: Insufficient documentation

## 2022-01-24 DIAGNOSIS — T23221D Burn of second degree of single right finger (nail) except thumb, subsequent encounter: Secondary | ICD-10-CM | POA: Diagnosis not present

## 2022-01-24 DIAGNOSIS — H409 Unspecified glaucoma: Secondary | ICD-10-CM | POA: Insufficient documentation

## 2022-01-27 NOTE — Progress Notes (Signed)
MECKENZIE, BALSLEY ([id number]) Visit Report for 01/24/2022 Arrival Information Details Patient Name: Date of Service: Jennifer [name], Jennifer [name] 01/24/2022 10:00 A M Medical Record Number: [id number] Patient Account Number: [id number] Date of Birth/Sex: Treating RN: [birth year] (61 y.o. Jennifer [name], Jennifer Primary Care Damion Kant: [name] Other Clinician: Referring Madi Bonfiglio: Treating Simcha Speir/Extender: [name] in Treatment: 63 Visit Information History Since Last Visit Added or deleted any medications: No Patient Arrived: Gilford Rile Any new allergies or adverse reactions: No Arrival Time: 10:11 Had a fall or experienced change in No Accompanied By: self activities of daily living that may affect Transfer Assistance: None risk of falls: Patient Identification Verified: Yes Signs or symptoms of abuse/neglect since last visito No Secondary Verification Process Completed: Yes Hospitalized since last visit: No Patient Requires Transmission-Based Precautions: No Implantable device outside of the clinic excluding No Patient Has Alerts: No cellular tissue based products placed in the center since last visit: Has Dressing in Place as Prescribed: Yes Pain Present Now: Yes Electronic Signature(s) Signed: 01/27/2022 8:03:42 AM By: Sandre Kitty Entered By: Sandre Kitty on 01/24/2022 10:11:53 -------------------------------------------------------------------------------- Lower Extremity Assessment Details Patient Name: Date of Service: Jennifer [name], Jennifer [name] 01/24/2022 10:00 A M Medical Record Number: [id number] Patient Account Number: [id number] Date of Birth/Sex: Treating RN: 04-01-1961 (60 y.o. Jennifer [name] Primary Care Witt Plitt: [name] Other Clinician: Referring Benford Asch: Treating Geneviene Tesch/Extender: [name] in Treatment: 20 Electronic Signature(s) Signed: 01/24/2022 1:32:11 PM By: Lorrin Jackson Entered By: Lorrin Jackson on 01/24/2022 10:19:05 -------------------------------------------------------------------------------- Multi Wound Chart Details Patient Name: Date of Service: Jennifer Jennifer L. 01/24/2022 10:00 A M Medical Record Number: [id number] Patient Account Number: [id number] Date of Birth/Sex: Treating RN: [birth year] (61 y.o. Jennifer [name], Jennifer Primary Care Braleigh Massoud: [name] Other Clinician: Referring Saajan Willmon: Treating Kvon Mcilhenny/Extender: [name] in Treatment: 20 Vital Signs Height(in): 68 Capillary Blood Glucose(mg/dl): 57 Weight(lbs): 240 Pulse(bpm): 35 Body Mass Index(BMI): 37.6 Blood Pressure(mmHg): 169/83 Temperature(F): 98.4 Respiratory Rate(breaths/min): 20 Photos: [N/A:N/A] Right Hand - 2nd Digit N/A N/A Wound Location: Blister N/A N/A Wounding Event: 2nd degree Burn N/A N/A Primary Etiology: Cataracts, Glaucoma, Chronic sinus N/A N/A Comorbid History: problems/congestion, Anemia, Asthma, Chronic Obstructive Pulmonary Disease (COPD), Congestive Heart Failure, Hypertension, Peripheral Arterial Disease, Peripheral Venous Disease, Type II Diabetes, Rheumatoid Arthritis, Osteoarthritis, Osteomyelitis, Neuropathy, Seizure Disorder 11/19/2021 N/A N/A Date Acquired: 5 N/A N/A Weeks of Treatment: Open N/A N/A Wound Status: No N/A N/A Wound Recurrence: 0.6x0.3x0.1 N/A N/A Measurements L x W x D (cm) 0.141 N/A N/A A (cm) : rea 0.014 N/A N/A Volume (cm) : 75.00% N/A N/A % Reduction in A rea: 75.40% N/A N/A % Reduction in Volume: Full Thickness Without Exposed N/A N/A Classification: Support Structures Medium N/A N/A Exudate A mount: Serosanguineous N/A N/A Exudate Type: red, brown N/A N/A Exudate Color: Distinct, outline attached N/A N/A Wound Margin: Large (67-100%) N/A N/A Granulation A mount: Pink, Pale N/A N/A Granulation Quality: Small (1-33%) N/A N/A Necrotic A mount: Fat Layer (Subcutaneous Tissue):  Yes N/A N/A Exposed Structures: Fascia: No Tendon: No Muscle: No Joint: No Bone: No Medium (34-66%) N/A N/A Epithelialization: Debridement - Excisional N/A N/A Debridement: Pre-procedure Verification/Time Out 10:59 N/A N/A Taken: Other N/A N/A Pain Control: Subcutaneous, Slough N/A N/A Tissue Debrided: Skin/Subcutaneous Tissue N/A N/A Level: 0.18 N/A N/A Debridement A (sq cm): rea Curette N/A N/A Instrument: Minimum N/A N/A Bleeding: Pressure N/A N/A Hemostasis A chieved: Procedure was tolerated well N/A N/A Debridement Treatment  Response: 0.6x0.3x0.1 N/A N/A Post Debridement Measurements L x W x D (cm) 0.014 N/A N/A Post Debridement Volume: (cm) Debridement N/A N/A Procedures Performed: Treatment Notes Electronic Signature(s) Signed: 01/24/2022 12:36:58 PM By: Kalman Shan DO Signed: 01/27/2022 4:37:55 PM By: Baruch Gouty RN, BSN Entered By: Kalman Shan on 01/24/2022 12:22:20 -------------------------------------------------------------------------------- Multi-Disciplinary Care Plan Details Patient Name: Date of Service: Jennifer Jennifer L. 01/24/2022 10:00 A M Medical Record Number: [id number] Patient Account Number: [id number] Date of Birth/Sex: Treating RN: September 23, 1961 (61 y.o. Jennifer [name] Primary Care [name]: [name] Other Clinician: Referring Aerik Polan: Treating Lamekia Nolden/Extender: [name] in Treatment: [place] reviewed with physician Active Inactive Wound/Skin Impairment Nursing Diagnoses: Impaired tissue integrity Goals: Patient/caregiver will verbalize understanding of skin care regimen Date Initiated: 09/03/2021 Target Resolution Date: 02/21/2022 Goal Status: Active Ulcer/skin breakdown will have a volume reduction of 30% by week 4 Date Initiated: 12/20/2021 Target Resolution Date: 02/21/2022 Goal Status: Active Interventions: Assess patient/caregiver ability to  perform ulcer/skin care regimen upon admission and as needed Assess ulceration(s) every visit Provide education on ulcer and skin care Treatment Activities: Skin care regimen initiated : 09/03/2021 Topical wound management initiated : 09/03/2021 Notes: Electronic Signature(s) Signed: 01/24/2022 1:32:11 PM By: Lorrin Jackson Entered By: Lorrin Jackson on 01/24/2022 10:21:15 -------------------------------------------------------------------------------- Pain Assessment Details Patient Name: Date of Service: Jennifer Jennifer L. 01/24/2022 10:00 A M Medical Record Number: [id number] Patient Account Number: [id number] Date of Birth/Sex: Treating RN: Jan 11, 1961 (61 y.o. Elam Dutch Primary Care Stellar Gensel: [name] Other Clinician: Referring Avelardo Reesman: Treating Kryslyn Helbig/Extender: [name] in Treatment: 20 Active Problems Location of Pain Severity and Description of Pain Patient Has Paino Yes Site Locations Rate the pain. Rate the pain. Current Pain Level: 9 Pain Management and Medication Current Pain Management: Electronic Signature(s) Signed: 01/27/2022 8:03:42 AM By: Sandre Kitty Signed: 01/27/2022 4:37:55 PM By: Baruch Gouty RN, BSN Entered By: Sandre Kitty on 01/24/2022 10:12:30 -------------------------------------------------------------------------------- Patient/Caregiver Education Details Patient Name: Date of Service: Jennifer [name], Jennifer L. 2/3/2023andnbsp10:00 A M Medical Record Number: [id number] Patient Account Number: [id number] Date of Birth/Gender: Treating RN: [birth year] (61 y.o. Jennifer [name] Primary Care Physician: [name] Other Clinician: Referring Physician: Treating Physician/Extender: [name] in Treatment: 20 Education Assessment Education Provided To: Patient Education Topics Provided Wound/Skin Impairment: Methods: Demonstration, Explain/Verbal,  Printed Responses: State content correctly [employer]) Signed: 01/24/2022 1:32:11 PM By: Lorrin Jackson Entered By: Lorrin Jackson on 01/24/2022 10:21:35 -------------------------------------------------------------------------------- Wound Assessment Details Patient Name: Date of Service: Jennifer Jennifer L. 01/24/2022 10:00 A M Medical Record Number: [id number] Patient Account Number: [id number] Date of Birth/Sex: Treating RN: August 10, 1961 (61 y.o. Jennifer [name], Vaughan Basta Primary Care Samir Ishaq: [name] Other Clinician: Referring Alyxander Kollmann: Treating Oluchi Pucci/Extender: [name] in Treatment: 20 Wound Status Wound Number: [id number] Primary 2nd degree Burn Etiology: Wound Location: Right Hand - 2nd Digit Wound Open Wounding Event: Blister Status: Date Acquired: 11/19/2021 Comorbid Cataracts, Glaucoma, Chronic sinus problems/congestion, Anemia, Weeks Of Treatment: 5 History: Asthma, Chronic Obstructive Pulmonary Disease (COPD), Clustered Wound: No Congestive Heart Failure, Hypertension, Peripheral Arterial Disease, Peripheral Venous Disease, Type II Diabetes, Rheumatoid Arthritis, Osteoarthritis, Osteomyelitis, Neuropathy, Seizure Disorder Photos Wound Measurements Length: (cm) 0.6 Width: (cm) 0.3 Depth: (cm) 0.1 Area: (cm) 0.141 Volume: (cm) 0.014 % Reduction in Area: 75% % Reduction in Volume: 75.4% Epithelialization: Medium (34-66%) Tunneling: No Undermining: No Wound Description Classification: Full Thickness Without Exposed Support Structures Wound Margin: Distinct, outline attached  Exudate Amount: Medium Exudate Type: Serosanguineous Exudate Color: red, brown Foul Odor After Cleansing: No Slough/Fibrino Yes Wound Bed Granulation Amount: Large (67-100%) Exposed Structure Granulation Quality: Pink, Pale Fascia Exposed: No Necrotic Amount: Small (1-33%) Fat Layer (Subcutaneous Tissue) Exposed: Yes Necrotic Quality: Adherent  Slough Tendon Exposed: No Muscle Exposed: No Joint Exposed: No Bone Exposed: No Electronic Signature(s) Signed: 01/24/2022 1:32:11 PM By: Lorrin Jackson Signed: 01/27/2022 4:37:55 PM By: Baruch Gouty RN, BSN Entered By: Lorrin Jackson on 01/24/2022 10:20:25 -------------------------------------------------------------------------------- Vitals Details Patient Name: Date of Service: Jennifer Jennifer L. 01/24/2022 10:00 A M Medical Record Number: [id number] Patient Account Number: [id number] Date of Birth/Sex: Treating RN: 09/04/1961 (61 y.o. Elam Dutch Primary Care Nohlan Burdin: [name] Other Clinician: Referring Clydene Burack: Treating Lavance Beazer/Extender: [name] in Treatment: 20 Vital Signs Time Taken: 10:11 Temperature (F): 98.4 Height (in): 67 Pulse (bpm): 88 Weight (lbs): 240 Respiratory Rate (breaths/min): 20 Body Mass Index (BMI): 37.6 Blood Pressure (mmHg): 169/83 Capillary Blood Glucose (mg/dl): 88 Reference Range: 80 - 120 mg / dl Electronic Signature(s) Signed: 01/27/2022 8:03:42 AM By: Sandre Kitty Entered By: Sandre Kitty on 01/24/2022 10:12:15

## 2022-01-27 NOTE — Progress Notes (Signed)
Jennifer [name], Jennifer [name] ([id number]) Visit Report for 01/24/2022 Chief Complaint Document Details Patient Name: Date of Service: Jennifer [name], Jennifer [name] 01/24/2022 10:00 A M Medical Record Number: [id number] Patient Account Number: [id number] Date of Birth/Sex: Treating RN: [birth year] (61 y.o. Jennifer [name] Primary Care Provider: [name] Other Clinician: Referring Provider: Treating Provider/Extender: [name] in Treatment: 20 Information Obtained from: Patient Chief Complaint 10/29/2021; patient presents for area of skin breakdown to her right lower extremity 12/30; patient presents for burn to her right second finger Electronic Signature(s) Signed: 01/24/2022 12:36:58 PM By: Kalman Shan DO Entered By: Kalman Shan on 01/24/2022 12:22:45 -------------------------------------------------------------------------------- Debridement Details Patient Name: Date of Service: Jennifer Reader L. 01/24/2022 10:00 A M Medical Record Number: [id number] Patient Account Number: [id number] Date of Birth/Sex: Treating RN: April 14, 1961 (61 y.o. Jennifer [name] Primary Care Provider: [name] Other Clinician: Referring Provider: Treating Provider/Extender: [name] in Treatment: 20 Debridement Performed for Assessment: Wound #24 Right Hand - 2nd Digit Performed By: Physician Kalman Shan, DO Debridement Type: Debridement Level of Consciousness (Pre-procedure): Awake and Alert Pre-procedure Verification/Time Out Yes - 10:59 Taken: Start Time: 11:00 Pain Control: Other : benzocaine 20% spray T Area Debrided (L x W): otal 0.6 (cm) x 0.3 (cm) = 0.18 (cm) Tissue and other material debrided: Non-Viable, Slough, Subcutaneous, Slough Level: Skin/Subcutaneous Tissue Debridement Description: Excisional Instrument: Curette Bleeding: Minimum Hemostasis Achieved: Pressure End Time: 11:04 Response to Treatment: Procedure was  tolerated well Level of Consciousness (Post- Awake and Alert procedure): Post Debridement Measurements of Total Wound Length: (cm) 0.6 Width: (cm) 0.3 Depth: (cm) 0.1 Volume: (cm) 0.014 Character of Wound/Ulcer Post Debridement: Stable Post Procedure Diagnosis Same as Pre-procedure Electronic Signature(s) Signed: 01/24/2022 12:36:58 PM By: Kalman Shan DO Signed: 01/24/2022 1:32:11 PM By: Lorrin Jackson Entered By: Lorrin Jackson on 01/24/2022 11:05:18 -------------------------------------------------------------------------------- HPI Details Patient Name: Date of Service: Jennifer Reader L. 01/24/2022 10:00 A M Medical Record Number: [id number] Patient Account Number: [id number] Date of Birth/Sex: Treating RN: 1961/08/25 (61 y.o. Jennifer [name] Primary Care Provider: [name] Other Clinician: Referring Provider: Treating Provider/Extender: [name] in Treatment: 20 History of Present Illness HPI Description: Admission 10/29/2021 [name] is a 61 year old female with a past medical history of insulin-dependent type 2 diabetes, left lower extremity BKA, right partial fifth ray amputation that presents today for a small open wound to her right lower extremity. She was recently hospitalized on 10/24/2021 for acute on chronic HFpEF. She was noted to Have a small open wound with weeping to her right lower extremity. She was diuresed and also placed in an Haematologist. She reports no issues since discharge. She states that the swelling on her right lower extremity has improved. She currently denies signs of infection. 11/15; patient presents for follow-up. She has no issues or complaints today. She reports her wound is healed and has her compression stocking today. Readmission 12/20/2021 Patient presents with a new wound to her right second finger caused by a burn 1 month ago. She has been using antibiotic ointment on the site. She reports  no improvement in wound healing over the past month. She currently denies signs of infection. 01/03/2022; patient presents for follow-up. She was able to obtain Santyl and states she is using this with Hydrofera Blue daily. She has no issues or complaints today. She denies signs of infection. 2/3; patient presents for follow-up. She has been using Santyl with Hydrofera Blue daily.  She has no issues or complaints today. She recently had a manicure. She denies signs of infection. Electronic Signature(s) Signed: 01/24/2022 12:36:58 PM By: Kalman Shan DO Entered By: Kalman Shan on 01/24/2022 12:30:13 -------------------------------------------------------------------------------- Physical Exam Details Patient Name: Date of Service: Jennifer [name], Jennifer [name] 01/24/2022 10:00 A M Medical Record Number: [id number] Patient Account Number: [id number] Date of Birth/Sex: Treating RN: [birth year] (61 y.o. Jennifer [name] Primary Care Provider: [name] Other Clinician: Referring Provider: Treating Provider/Extender: [name] in Treatment: 20 Constitutional respirations regular, non-labored and within target range for patient.Marland Kitchen Psychiatric pleasant and cooperative. Notes Right second finger with open wound with nonviable tissue. Appears well healing. No signs of surrounding infection. Electronic Signature(s) Signed: 01/24/2022 12:36:58 PM By: Kalman Shan DO Entered By: Kalman Shan on 01/24/2022 12:35:12 -------------------------------------------------------------------------------- Physician Orders Details Patient Name: Date of Service: Jennifer Reader L. 01/24/2022 10:00 A M Medical Record Number: [id number] Patient Account Number: [id number] Date of Birth/Sex: Treating RN: December 20, 1961 (60 y.o. Jennifer [name] Primary Care Provider: [name] Other Clinician: Referring Provider: Treating Provider/Extender: [name] in Treatment: 20 Verbal / Phone Orders: No Diagnosis Coding ICD-10 Coding Code Description Q73.419F Burn of second degree of right hand, unspecified site, initial encounter L98.498 Non-pressure chronic ulcer of skin of other sites with other specified severity E11.622 Type 2 diabetes mellitus with other skin ulcer I73.9 Peripheral vascular disease, unspecified X90.24 Chronic diastolic (congestive) heart failure Z89.512 Acquired absence of left leg below knee Z89.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) Z79.891 Long term (current) use of opiate analgesic Follow-up Appointments ppointment in 2 weeks. - Dr. Heber [place] Return A Bathing/ Shower/ Hygiene May shower and wash wound with soap and water. - when changing dressing Additional Orders / Instructions Follow Nutritious Diet Wound Treatment Wound #24 - Hand - 2nd Digit Wound Laterality: Right Cleanser: Soap and Water 1 x Per Day/30 Days Discharge Instructions: May shower and wash wound with dial antibacterial soap and water prior to dressing change. Prim Dressing: Santyl Ointment 1 x Per Day/30 Days ary Discharge Instructions: Apply nickel thick amount to wound bed as instructed Secondary Dressing: Woven Gauze Sponge, Non-Sterile 4x4 in 1 x Per Day/30 Days Discharge Instructions: Apply over primary dressing as directed. Secured With: 4M Medipore H Soft Cloth Surgical T ape, 4 x 10 (in/yd) 1 x Per Day/30 Days Discharge Instructions: Secure with tape as directed. Secured With: Netting 1 x Per Day/30 Days Electronic Signature(s) Signed: 01/24/2022 12:36:58 PM By: Kalman Shan DO Entered By: Kalman Shan on 01/24/2022 12:35:30 -------------------------------------------------------------------------------- Problem List Details Patient Name: Date of Service: Jennifer Reader L. 01/24/2022 10:00 A M Medical Record Number: [id number] Patient Account Number: [id number] Date of Birth/Sex: Treating RN: [birth year] (61  y.o. Jennifer [name] Primary Care Provider: [name] Other Clinician: Referring Provider: Treating Provider/Extender: [name] in Treatment: 20 Active Problems ICD-10 Encounter Code Description Active Date MDM Diagnosis T23.201A Burn of second degree of right hand, unspecified site, initial encounter 12/20/2021 No Yes L98.498 Non-pressure chronic ulcer of skin of other sites with other specified severity 12/20/2021 No Yes E11.622 Type 2 diabetes mellitus with other skin ulcer 09/03/2021 No Yes I73.9 Peripheral vascular disease, unspecified 09/03/2021 No Yes M42.68 Chronic diastolic (congestive) heart failure 09/03/2021 No Yes Z89.512 Acquired absence of left leg below knee 09/03/2021 No Yes Z89.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) 09/03/2021 No Yes Z79.891 Long term (current) use of opiate analgesic 09/03/2021 No Yes Inactive  Problems ICD-10 Code Description Active Date Inactive Date S91.301A Unspecified open wound, right foot, initial encounter 09/03/2021 09/03/2021 L97.811 Non-pressure chronic ulcer of other part of right lower leg limited to breakdown of skin 10/29/2021 10/29/2021 Resolved Problems ICD-10 Code Description Active Date Resolved Date E11.621 Type 2 diabetes mellitus with foot ulcer 09/03/2021 09/03/2021 S91.301D Unspecified open wound, right foot, subsequent encounter 09/10/2021 09/10/2021 Electronic Signature(s) Signed: 01/24/2022 12:36:58 PM By: Kalman Shan DO Entered By: Kalman Shan on 01/24/2022 12:22:14 -------------------------------------------------------------------------------- Progress Note Details Patient Name: Date of Service: Jennifer Reader L. 01/24/2022 10:00 A M Medical Record Number: [id number] Patient Account Number: [id number] Date of Birth/Sex: Treating RN: 1961-09-11 (61 y.o. Jennifer [name] Primary Care Provider: [name] Other Clinician: Referring Provider: Treating Provider/Extender:  [name] in Treatment: 20 Subjective Chief Complaint Information obtained from Patient 10/29/2021; patient presents for area of skin breakdown to her right lower extremity 12/30; patient presents for burn to her right second finger History of Present Illness (HPI) Admission 10/29/2021 Ms. Karsten Vaughn is a 61 year old female with a past medical history of insulin-dependent type 2 diabetes, left lower extremity BKA, right partial fifth ray amputation that presents today for a small open wound to her right lower extremity. She was recently hospitalized on 10/24/2021 for acute on chronic HFpEF. She was noted to Have a small open wound with weeping to her right lower extremity. She was diuresed and also placed in an Haematologist. She reports no issues since discharge. She states that the swelling on her right lower extremity has improved. She currently denies signs of infection. 11/15; patient presents for follow-up. She has no issues or complaints today. She reports her wound is healed and has her compression stocking today. Readmission 12/20/2021 Patient presents with a new wound to her right second finger caused by a burn 1 month ago. She has been using antibiotic ointment on the site. She reports no improvement in wound healing over the past month. She currently denies signs of infection. 01/03/2022; patient presents for follow-up. She was able to obtain Santyl and states she is using this with Hydrofera Blue daily. She has no issues or complaints today. She denies signs of infection. 2/3; patient presents for follow-up. She has been using Santyl with Hydrofera Blue daily. She has no issues or complaints today. She recently had a manicure. She denies signs of infection. Patient History Information obtained from Patient. Family History Diabetes - Mother, Heart Disease - Mother, Hypertension - Mother, Seizures - Siblings, Stroke - Siblings, No family history of Cancer,  Hereditary Spherocytosis, Kidney Disease, Lung Disease, Thyroid Problems, Tuberculosis. Social History Current every day smoker, Marital Status - Divorced, Alcohol Use - Never, Drug Use - No History, Caffeine Use - Moderate. Medical History Eyes Patient has history of Cataracts, Glaucoma - due to [place] Denies history of Optic Neuritis Ear/Nose/Mouth/Throat Patient has history of Chronic sinus problems/congestion - Flonase Denies history of Middle ear problems Hematologic/Lymphatic Patient has history of Anemia Denies history of Hemophilia, Human Immunodeficiency Virus, Lymphedema, Sickle Cell Disease Respiratory Patient has history of Asthma, Chronic Obstructive Pulmonary Disease (COPD) Denies history of Aspiration Cardiovascular Patient has history of Congestive Heart Failure - chronic diastolic, Hypertension, Peripheral Arterial Disease, 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 Denies history of Angina, Arrhythmia, Coronary Artery Disease, Deep Vein Thrombosis, Hypotension, Myocardial Infarction, Phlebitis, Vasculitis Gastrointestinal Denies history of Cirrhosis , Colitis, Crohnoos, Hepatitis A, Hepatitis B, Hepatitis C Endocrine Patient has history of Type II Diabetes - uncontrolled  w/ side effects Denies history of Type I Diabetes Genitourinary Denies history of End Stage Renal Disease Immunological Denies history of Lupus Erythematosus, Raynaudoos, Scleroderma Integumentary (Skin) Denies history of History of Burn Musculoskeletal Patient has history of Rheumatoid Arthritis, Osteoarthritis - spine, Osteomyelitis - hx left foot Denies history of Gout Neurologic Patient has history of Neuropathy, Seizure Disorder Denies history of Dementia, Quadriplegia, Paraplegia Oncologic Denies history of Received Chemotherapy, Received Radiation Psychiatric Denies history of Anorexia/bulimia, Confinement Anxiety Hospitalization/Surgery History - left leg amputated. - COPD, PNA. 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 Medical A Surgical History Notes nd Constitutional Symptoms (General Health) chronic insomnia , opioid dependence , gait abnormality , severe obesity , s/p BKA , Eyes diabetic retinopathy Ear/Nose/Mouth/Throat oral mucosa lesions Hematologic/Lymphatic thrombocytopenia Cardiovascular hyperlipidemia , infective endocarditis , aortic atherosclerosis , Gastrointestinal GERD Integumentary (Skin) foot ulcer Neurologic childhood seizures Hx Bells Palsy Psychiatric depression Objective Constitutional respirations regular, non-labored and within target range for patient.. Vitals Time Taken: 10:11 AM, Height: 67 in, Weight: 240 lbs, BMI: 37.6, Temperature: 98.4 F, Pulse: 88 bpm, Respiratory Rate: 20 breaths/min, Blood Pressure: 169/83 mmHg, Capillary Blood Glucose: 88 mg/dl. Psychiatric pleasant and cooperative. General Notes: Right second finger with open wound with nonviable tissue. Appears well healing. No signs of surrounding infection. Integumentary (Hair, Skin) Wound #24 status is Open. Original cause of wound was Blister. The date acquired was: 11/19/2021. The wound has been in treatment 5 weeks. The wound is located on the Right Hand - 2nd Digit. The wound measures 0.6cm length x 0.3cm width x 0.1cm depth; 0.141cm^2 area and 0.014cm^3 volume. There is Fat Layer (Subcutaneous Tissue) exposed. There is no tunneling or undermining noted. There is a medium amount of serosanguineous drainage noted. The wound margin is distinct with the outline attached to the wound base. There is large (67-100%) pink, pale granulation within the wound bed. There is a small (1-33%) amount of necrotic tissue within the wound bed including Adherent Slough. 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 I debrided nonviable tissue. I recommend at this time just doing Santyl ointment daily. There were no signs of surrounding infection. She has not obtained her x-ray. Follow-up in 2 weeks. Procedures Wound #24 Pre-procedure diagnosis of Wound #24 is a 2nd degree Burn located on the Right Hand - 2nd Digit . There was a Excisional Skin/Subcutaneous Tissue Debridement with a total area of 0.18 sq cm performed by Kalman Shan, DO. With the following instrument(s): Curette to remove Non-Viable tissue/material. Material removed includes Subcutaneous Tissue and Slough and after achieving pain control using Other (benzocaine 20% spray). No specimens were taken. A time out was conducted at 10:59, prior to the start of the procedure. A Minimum amount of bleeding was controlled with Pressure. The procedure was tolerated well. Post Debridement Measurements: 0.6cm length x 0.3cm width x 0.1cm depth; 0.014cm^3 volume. Character of Wound/Ulcer Post Debridement is stable. Post procedure Diagnosis Wound #24: Same as Pre-Procedure Plan Follow-up Appointments: Return Appointment in 2 weeks. - Dr. Heber [place] Bathing/ Shower/ Hygiene: May shower and wash wound with soap and water. - when changing dressing  Additional Orders / Instructions: Follow Nutritious Diet WOUND #24: - Hand - 2nd Digit Wound Laterality: Right Cleanser: Soap and Water 1 x Per Day/30 Days Discharge Instructions: May shower and wash wound with dial antibacterial soap and water prior to dressing change. Prim Dressing: Santyl Ointment 1 x Per Day/30 Days ary Discharge Instructions: Apply nickel thick amount to wound bed as instructed Secondary  Dressing: Woven Gauze Sponge, Non-Sterile 4x4 in 1 x Per Day/30 Days Discharge Instructions: Apply over primary dressing as directed. Secured With: 73M Medipore H Soft Cloth Surgical T ape, 4 x 10 (in/yd) 1 x Per Day/30 Days Discharge Instructions: Secure with tape as directed. Secured With: Netting 1 x Per Day/30 Days 1. Santyl 2. Follow-up in 2 weeks Electronic Signature(s) Signed: 01/24/2022 12:36:58 PM By: Kalman Shan DO Entered By: Kalman Shan on 01/24/2022 12:36:25 -------------------------------------------------------------------------------- HxROS Details Patient Name: Date of Service: Jennifer Reader L. 01/24/2022 10:00 A M Medical Record Number: [id number] Patient Account Number: [id number] Date of Birth/Sex: Treating RN: 07-Feb-1961 (61 y.o. 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 Negative for: Optic Neuritis Past Medical History Notes: diabetic retinopathy Ear/Nose/Mouth/Throat Medical History: Positive for: Chronic sinus problems/congestion - Flonase Negative for: Middle ear problems Past Medical History Notes: oral mucosa lesions Hematologic/Lymphatic Medical History: Positive for: Anemia Negative for: Hemophilia; Human Immunodeficiency Virus; Lymphedema; Sickle Cell Disease Past Medical History Notes: thrombocytopenia Respiratory Medical History: Positive for: Asthma; Chronic Obstructive Pulmonary Disease (COPD) Negative for: Aspiration Cardiovascular Medical History: Positive for: Congestive Heart Failure - chronic diastolic;  Hypertension; Peripheral Arterial Disease; 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 Negative for: Angina; Arrhythmia; Coronary Artery Disease; Deep Vein Thrombosis; Hypotension; Myocardial Infarction; Phlebitis; Vasculitis Past Medical History Notes: hyperlipidemia , infective endocarditis , aortic atherosclerosis , Gastrointestinal Medical History: Negative for: Cirrhosis ; Colitis; Crohns; Hepatitis A; Hepatitis B; Hepatitis C Past Medical History Notes: GERD Endocrine Medical History: Positive for: Type II Diabetes - uncontrolled w/ side effects Negative for: Type I Diabetes Time with diabetes: 1993 Treated with: Insulin Blood sugar tested every day: Yes Tested : daily Genitourinary Medical History: Negative for: End Stage Renal Disease Immunological Medical History: Negative for: Lupus Erythematosus; Raynauds; Scleroderma Integumentary (Skin) Medical History: Negative for: History of Burn Past Medical History Notes: foot ulcer Musculoskeletal Medical History: Positive for: Rheumatoid Arthritis; Osteoarthritis - spine; Osteomyelitis - hx left foot Negative for: Gout Neurologic Medical History: Positive for: Neuropathy; Seizure Disorder Negative for: Dementia; Quadriplegia; Paraplegia Past Medical History Notes: childhood seizures Hx Bells Palsy Oncologic Medical History: Negative for: Received Chemotherapy; Received Radiation Psychiatric Medical History: Negative for: Anorexia/bulimia; Confinement Anxiety Past Medical History Notes: depression HBO Extended History Items Ear/Nose/Mouth/Throat: Eyes: Eyes: Chronic sinus Cataracts Glaucoma problems/congestion Immunizations Pneumococcal Vaccine: Received Pneumococcal Vaccination: Yes Received Pneumococcal Vaccination On or After 60th Birthday: No Immunization Notes: tetanus shot in 2014 Implantable Devices None Hospitalization / Surgery History Type of Hospitalization/Surgery left leg amputated COPD,  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 Family and Social History Cancer: No; Diabetes: Yes - Mother; Heart Disease: Yes - Mother; Hereditary Spherocytosis: No; Hypertension: Yes - Mother; Kidney Disease: No; Lung Disease: No; Seizures: Yes - Siblings; Stroke: Yes - Siblings; Thyroid Problems: No;  Tuberculosis: No; Current every day smoker; Marital Status - Divorced; Alcohol Use: Never; Drug Use: No History; Caffeine Use: Moderate; Financial Concerns: No; Food, Clothing or Shelter Needs: No; Support System Lacking: No; Transportation Concerns: No Electronic Signature(s) Signed: 01/24/2022 12:36:58 PM By: Kalman Shan DO Signed: 01/27/2022 4:37:55 PM By: Baruch Gouty RN, BSN Entered By: Kalman Shan on 01/24/2022 12:30:21 -------------------------------------------------------------------------------- [place] Details Patient Name: Date of Service: Jennifer [name] 01/24/2022 Medical Record Number: [id number] Patient Account Number: [id number] Date of Birth/Sex: Treating RN: 1961-12-17 (61 y.o. Jennifer [name] Primary Care Provider: [name] Other Clinician: Referring Provider: Treating Provider/Extender: [name] in Treatment: 20 Diagnosis Coding ICD-10 Codes Code Description X54.008Q Burn of second degree of right hand, unspecified site, initial encounter L98.498 Non-pressure chronic ulcer of skin of other sites with other specified severity E11.622 Type 2 diabetes mellitus with other skin ulcer I73.9 Peripheral vascular disease, unspecified P61.95 Chronic diastolic (congestive) heart failure Z89.512 Acquired absence of left leg below knee Z89.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) Z79.891 Long term (current) use of opiate analgesic Facility Procedures CPT4 Code: [id number] Description: 11042 - DEB SUBQ TISSUE 20 SQ CM/< ICD-10 Diagnosis Description L98.498 Non-pressure chronic ulcer of skin of other sites with other specified  severi Modifier: ty Quantity: 1 Physician Procedures : CPT4 Code Description Modifier [id number] 11042 - WC PHYS SUBQ TISS 20 SQ CM ICD-10 Diagnosis Description L98.498 Non-pressure chronic ulcer of skin of other sites with other specified severity Quantity: 1 Electronic Signature(s) Signed: 01/24/2022 12:36:58 PM By: Kalman Shan DO Entered By: Kalman Shan on 01/24/2022 12:36:37

## 2022-01-28 ENCOUNTER — Other Ambulatory Visit: Payer: Self-pay

## 2022-01-28 ENCOUNTER — Ambulatory Visit: Payer: 59 | Admitting: Behavioral Health

## 2022-01-28 DIAGNOSIS — F332 Major depressive disorder, recurrent severe without psychotic features: Secondary | ICD-10-CM

## 2022-01-28 DIAGNOSIS — F419 Anxiety disorder, unspecified: Secondary | ICD-10-CM

## 2022-01-29 ENCOUNTER — Other Ambulatory Visit: Payer: Self-pay | Admitting: Internal Medicine

## 2022-01-29 NOTE — Telephone Encounter (Signed)
Last rx written 01/01/22. Last OV 01/20/22 with Dr Elliot Gurney. Next OV 02/06/22. UDS 06/16/19.

## 2022-01-29 NOTE — Telephone Encounter (Signed)
Pt requesting a call back. Pt states her Home Health stated she has kidney problems and she is unaware of this information.  Pt requesting for clarification.

## 2022-01-29 NOTE — BH Specialist Note (Signed)
Integrated Behavioral Health Follow Up In-Person Visit  MRN: [id number] Name: Jennifer [name]  Number of [place] Clinician visits: 3/6 Session Start time: 2:15pm Session End time: 2:45pm Total time in minutes: 30 min  Types of Service: Individual psychotherapy  Interpretor:No. Interpretor Name and Language: n/a   Subjective: Jennifer [name] is a 61 y.o. female accompanied by  self Patient was referred by Dr. Lenise Herald, MD for mental health wellness support. Patient reports the following symptoms/concerns: elevated anx/dep for state of Son's mental health who was recently released from ICU hosp'tzn earlier this year Duration of problem: years of Son having anger issues that directly & indirectly land him in jail/prison; Severity of problem: mild & trends moderate  Objective: Mood:  fairly alert, but slurry speech & lack of tracking ability  and Affect: Constricted Risk of harm to self or others: No plan to harm self or others  Life Context: Family and Social: Pt has 2 Sons; oldest is Teacher, English as a foreign language currently works @ [employer] as a Scientist, water quality. He has recently been sent home from work due to a probable incident w/a Customer @ the [employer]. This Son has clear anger issues from childhood as described by Pt, placing him in & out of the [place]. His last incarceration was 11 yrs ago. Pt feels this life predicament was in part due to his Father's absence & his own prison Hx.  Pt has a younger Son Tommi Rumps who is struggling w/a 6" lipoma in the cervical spine area. She feels this needs to be addressed medically, but her Sons do not trust healthcare.  Pt is in wheelchair today & unable to ambulate w/o DME.   Pt is lucid, but her communications are w/slurred speech & lacking ability to track along w/the Clinician. This is likely due to medication s/e combined w/mild cognitive impairment.  School/Work: Pt does not currently work or attend  school.  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 Pt has died her hair black recently to assist her self image & also wears wigs for her confidence. Pt keeps her nails groomed/designed/painted, & is always well kempt. Life Changes: Pt has multi-chronic health issues & is trying to get & keep herself on track in 2023 after having nearly lost her oldest Son while on life-support last year.   Patient and/or Family's Strengths/Protective Factors: Concrete supports in place (healthy food, safe environments, etc.) and Sense of purpose  Goals Addressed: Patient will:  Reduce symptoms of: anxiety, depression, mood instability, and stress   Increase knowledge and/or ability of: coping skills, healthy habits, stress reduction, and positive boundary-setting w/2 Adult Sons.    Demonstrate ability to: Increase healthy adjustment to current life circumstances, Begin healthy grieving over loss, and determine if the mgmt of her medications is an issue; do Sons help her or does she manage prescriptions independently.  Progress towards Goals: Ongoing  Interventions: Interventions utilized:  Solution-Focused Strategies, Behavioral Activation, and Supportive Counseling Standardized Assessments completed:  screeners prn  Patient and/or Family Response: Pt late for visit but receptive to discussion which was self-lead today. She recounted her experiences as a Mother & the efforts she made/makes for her 39.   Patient Centered Plan: Patient is on the following Treatment Plan(s): Pt will attend scheduled sessions & set revised goals for sessions.  Assessment: Patient currently experiencing elevated anx/dep since her  Son has been having issues w/his anger again. Pt does not want to cope w/him going to prison again.  Patient may benefit from normalization /validation of current needs of her oldest Adult Son. She cannot  support him w/her current financial status & she feels his job may be in jeopardy.  Plan: Follow up with behavioral health clinician on : one month for first avail as is convenient for Pt Behavioral recommendations: Try to write some in a Notebook to process your grief over the unexpected death of son Elgin's Father.  Referral(s): [place] (In Clinic) "From scale of 1-10, how likely are you to follow plan?": [place], LMFT

## 2022-01-29 NOTE — Telephone Encounter (Signed)
Refill Request-   oxyCODONE-acetaminophen (PERCOCET/ROXICET) 5-325 MG tablet  [id number] - [place], [place] - [address] (Ph: [phone])

## 2022-01-30 MED ORDER — OXYCODONE-ACETAMINOPHEN 5-325 MG PO TABS: 1.0000 | ORAL_TABLET | Freq: Four times a day (QID) | ORAL | 0 refills | Status: DC | PRN

## 2022-01-30 NOTE — Telephone Encounter (Addendum)
Patient calling back. States her insurance company and [place]  have notified her of kidney disease that she was unaware of. Requesting call back to discuss.

## 2022-01-31 ENCOUNTER — Other Ambulatory Visit: Payer: Self-pay

## 2022-01-31 ENCOUNTER — Ambulatory Visit: Payer: Medicare Other | Attending: Internal Medicine

## 2022-01-31 DIAGNOSIS — R269 Unspecified abnormalities of gait and mobility: Secondary | ICD-10-CM | POA: Insufficient documentation

## 2022-01-31 DIAGNOSIS — Z89512 Acquired absence of left leg below knee: Secondary | ICD-10-CM | POA: Insufficient documentation

## 2022-01-31 DIAGNOSIS — M6281 Muscle weakness (generalized): Secondary | ICD-10-CM | POA: Diagnosis present

## 2022-01-31 DIAGNOSIS — R2689 Other abnormalities of gait and mobility: Secondary | ICD-10-CM | POA: Insufficient documentation

## 2022-01-31 DIAGNOSIS — E1121 Type 2 diabetes mellitus with diabetic nephropathy: Secondary | ICD-10-CM

## 2022-01-31 DIAGNOSIS — M79644 Pain in right finger(s): Secondary | ICD-10-CM

## 2022-01-31 DIAGNOSIS — Z89612 Acquired absence of left leg above knee: Secondary | ICD-10-CM | POA: Diagnosis not present

## 2022-01-31 MED ORDER — NYSTATIN 100000 UNIT/GM EX POWD: 1.0000 "application " | Freq: Three times a day (TID) | CUTANEOUS | 5 refills | Status: DC | PRN

## 2022-01-31 MED ORDER — DICLOFENAC SODIUM 1 % EX GEL: 2.0000 g | Freq: Four times a day (QID) | CUTANEOUS | 1 refills | Status: DC

## 2022-01-31 MED ORDER — INSULIN PEN NEEDLE 32G X 4 MM MISC: 10 refills | Status: DC

## 2022-01-31 NOTE — Telephone Encounter (Signed)
Returned call to patient. Requesting refills on:  Oxycodone - explained this Rx was sent yesterday  Lyrica - explained this was sent 1/30  Pen needles, nystatin, and Voltaren gel. Will forward these requests to PCP.  Also, discussed CKD3. Read note from PCP to her. She will also listen to VM left by PCP. She is wanting to know what she should try to avoid. Explained she could discuss this with PCP and [name] at upcoming appts.  Confirmed appt with PCP 2/16 at [facility]
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
## 2022-01-31 NOTE — Telephone Encounter (Signed)
Requesting to speak with a nurse about medications. Please call pt back.  °

## 2022-02-01 NOTE — Progress Notes (Signed)
Subjective:  [name], female    DOB: September 16, 1961,  MRN: [id number]  Jennifer [name] presents to clinic today for at risk foot care. Patient has h/o amputation of below knee amputation left lower extremity, digital amputation R 4th toe, and 5th ray amputation right lower extremity and thick, elongated toenails R hallux, R 2nd toe, and R 3rd toe which are tender when wearing enclosed shoe gear.  Patient states blood glucose was 88 mg/dl today.   New problem(s): None.   PCP is Angelica Pou, MD , and last visit was 12/26/2021.  Allergies  Allergen Reactions   Benazepril Swelling    Possible tongue swelling 06/14/2021   Pineapple Swelling    Fresh pineapple - lip swelling   Abilify [Aripiprazole] Other (See Comments)    Urinary freq Nov 2016   Iohexol Other (See Comments)    IV CONTRAST CAUSED TRANSIENT NEPHROPATHY (KIDNEY INSUFFICIENCY) IN 2007    Ivp Dye [Iodinated Contrast Media] Other (See Comments)    IV CONTRAST CAUSED TRANSIENT NEPHROPATHY (KIDNEY INSUFFICIENCY) IN 2007   Morphine Sulfate Itching and Rash    Review of Systems: Negative except as noted in the HPI. Objective:   Constitutional Jennifer [name] is a pleasant 61 y.o. African American female, in NAD. AAO x 3.   Vascular Capillary refill time to remaining digits <3 seconds. Faintly palpable DP pulse(s) right lower extremity. Faintly palpable PT pulse(s) right lower extremity. Pedal hair absent. No pain with calf compression RLE. Lower extremity skin temperature gradient within normal limits. Dependent edema noted RLE. No ischemia or gangrene noted right lower extremity. No cyanosis or clubbing noted right lower extremity.  Neurologic Normal speech. Oriented to person, place, and time. Protective sensation decreased with 10 gram monofilament right lower extremity. Vibratory sensation diminished right lower extremity.  Dermatologic Toenails R hallux, R 2nd toe, and R 3rd toe elongated, discolored,  dystrophic, thickened, and crumbly with subungual debris and tenderness to dorsal palpation. Hyperkeratotic lesion(s) 1st metatarsal head right lower extremity.  No erythema, no edema, no drainage, no fluctuance.  Orthopedic: Muscle strength 4/5to all LE muscle groups of right lower extremity. Lower extremity amputation(s): below knee amputation left lower extremity, digital amputation R 4th toe, and 5th ray amputation right lower extremity. Wearing diabetic shoes today. Utilizes rollator for ambulation assistance.   Radiographs: None  Last A1c:  Hemoglobin A1C Latest Ref Rng & Units 12/04/2021 09/19/2021 07/11/2021 03/04/2021  HGBA1C 4.0 - 5.6 % 7.7(A) 7.0(A) 7.4(A) 8.0(H)  Some recent data might be hidden     Assessment:   1. Onychomycosis   2. Callus   3. Hx of BKA, left ([place])   4. Status post amputation of lesser toe, right ([place])   5. History of partial ray amputation of fifth toe of right foot ([place])   6. Diabetes mellitus due to underlying condition with diabetic autonomic neuropathy, unspecified whether long term insulin use ([place])    Plan:  Patient was evaluated and treated and all questions answered. Consent given for treatment as described below: -Examined patient. -Continue foot and shoe inspections daily. Monitor blood glucose per PCP/Endocrinologist's recommendations. -Continue diabetic shoes daily. -Toenails R hallux, R 2nd toe, and R 3rd toe debrided in length and girth without iatrogenic bleeding with sterile nail nipper and dremel.  -Callus(es) 1st metatarsal head right foot pared utilizing sterile scalpel blade without complication or incident. Total number debrided =1. -Patient/POA to call should there be question/concern in the interim.  Return in about 9  weeks (around 03/28/2022).  Marzetta Board, DPM

## 2022-02-03 ENCOUNTER — Other Ambulatory Visit: Payer: Self-pay

## 2022-02-03 ENCOUNTER — Ambulatory Visit
Admission: RE | Admit: 2022-02-03 | Discharge: 2022-02-03 | Disposition: A | Discharge status: Home / Self Care | Payer: Medicare Other | Source: Ambulatory Visit | Attending: Internal Medicine | Admitting: Internal Medicine

## 2022-02-03 DIAGNOSIS — R1032 Left lower quadrant pain: Secondary | ICD-10-CM

## 2022-02-03 MED ORDER — IOPAMIDOL (ISOVUE-300) INJECTION 61%
100.0000 mL | Freq: Once | INTRAVENOUS | Status: AC | PRN
  Administered 2022-02-03: 100 mL via INTRAVENOUS

## 2022-02-04 ENCOUNTER — Ambulatory Visit: Payer: Medicare Other | Admitting: Physical Therapy

## 2022-02-04 ENCOUNTER — Encounter: Payer: Self-pay | Admitting: Physical Therapy

## 2022-02-04 DIAGNOSIS — M6281 Muscle weakness (generalized): Secondary | ICD-10-CM

## 2022-02-04 DIAGNOSIS — R2689 Other abnormalities of gait and mobility: Secondary | ICD-10-CM | POA: Diagnosis not present

## 2022-02-04 NOTE — Patient Instructions (Signed)
Access Code: [email or web address] URL: [email or web address] Date: 02/04/2022 Prepared by: Willow Ora  Exercises Sit to Stand with Counter Support - 1 x daily - 5 x weekly - 1 sets - 10 reps Seated Hip Abduction with Resistance - 1 x daily - 5 x weekly - 1 sets - 10 reps - 5 seconds hold Seated March with Resistance - 1 x daily - 5 x weekly - 1 sets - 10 reps Mini Squat - 1 x daily - 5 x weekly - 1 sets - 10 reps
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
## 2022-02-06 ENCOUNTER — Encounter: Payer: Self-pay | Admitting: Internal Medicine

## 2022-02-06 ENCOUNTER — Ambulatory Visit (INDEPENDENT_AMBULATORY_CARE_PROVIDER_SITE_OTHER): Payer: Medicare Other | Admitting: Internal Medicine

## 2022-02-06 VITALS — BP 119/56 | HR 75 | Temp 99.3°F | Ht 65.0 in | Wt 244.5 lb

## 2022-02-06 DIAGNOSIS — E1122 Type 2 diabetes mellitus with diabetic chronic kidney disease: Secondary | ICD-10-CM | POA: Diagnosis not present

## 2022-02-06 DIAGNOSIS — N3946 Mixed incontinence: Secondary | ICD-10-CM

## 2022-02-06 DIAGNOSIS — I872 Venous insufficiency (chronic) (peripheral): Secondary | ICD-10-CM | POA: Diagnosis not present

## 2022-02-06 DIAGNOSIS — E118 Type 2 diabetes mellitus with unspecified complications: Secondary | ICD-10-CM

## 2022-02-06 DIAGNOSIS — E1151 Type 2 diabetes mellitus with diabetic peripheral angiopathy without gangrene: Secondary | ICD-10-CM | POA: Diagnosis not present

## 2022-02-06 DIAGNOSIS — N182 Chronic kidney disease, stage 2 (mild): Secondary | ICD-10-CM

## 2022-02-06 DIAGNOSIS — M7541 Impingement syndrome of right shoulder: Secondary | ICD-10-CM

## 2022-02-06 DIAGNOSIS — Z720 Tobacco use: Secondary | ICD-10-CM

## 2022-02-06 DIAGNOSIS — Z794 Long term (current) use of insulin: Secondary | ICD-10-CM | POA: Diagnosis not present

## 2022-02-06 DIAGNOSIS — F332 Major depressive disorder, recurrent severe without psychotic features: Secondary | ICD-10-CM

## 2022-02-06 DIAGNOSIS — R251 Tremor, unspecified: Secondary | ICD-10-CM

## 2022-02-06 DIAGNOSIS — E669 Obesity, unspecified: Secondary | ICD-10-CM

## 2022-02-06 DIAGNOSIS — K635 Polyp of colon: Secondary | ICD-10-CM

## 2022-02-06 DIAGNOSIS — Z89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): Secondary | ICD-10-CM

## 2022-02-06 DIAGNOSIS — R269 Unspecified abnormalities of gait and mobility: Secondary | ICD-10-CM

## 2022-02-06 LAB — POCT GLYCOSYLATED HEMOGLOBIN (HGB A1C): Hemoglobin A1C: 7.3 % — AB (ref 4.0–5.6)

## 2022-02-06 LAB — GLUCOSE, CAPILLARY: Glucose-Capillary: 136 mg/dL — ABNORMAL HIGH (ref 70–99)

## 2022-02-06 MED ORDER — OZEMPIC (1 MG/DOSE) 4 MG/3ML ~~LOC~~ SOPN: 1.0000 mg | PEN_INJECTOR | SUBCUTANEOUS | 11 refills | Status: DC
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
## 2022-02-06 NOTE — Assessment & Plan Note (Signed)
Continue lasix,  Place Unna wrap today to prevent loss of skin integrity, RTC 5-7 days (could do on 2/21 when she sees [name]) for removal and replacement with a compression stocking (vs rewrap with Louretta Parma).  She will need to bring ia compression stocking from home.

## 2022-02-06 NOTE — Assessment & Plan Note (Signed)
Buproprion was ineffective.  Chantix caused abdominal/GI sxs symptoms in the past.  Isn't experiencing cravings so she doesn't think nicotine replacement therapy would be beneficial.   SHe is thinking about stopping "cold [place]", motivated both by potential health benefits and by cost savings.

## 2022-02-06 NOTE — Assessment & Plan Note (Signed)
She hasn't complained of a tremor in some time.  Monitor.

## 2022-02-06 NOTE — Patient Instructions (Addendum)
Jennifer [name], great to see you today as always!  Today we reviewed your kidney function.  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  Your kidneys are doing fine!  We can help maintain good function by keeping your diabetes and blood pressure under control, and by stopping smoking.    Please stop the HCTZ since you are now taking the lasix.   We wrapped your R leg today with a dressing to help with swelling.  You will keep it on for 1 week and return here for [place] to remove it.  Please bring your compression stocking from home and we will help put it on once the swelling goes down.  You can stop smoking!  Think about how much money you'll save and how you'll be doing something [place]!!!!!  You'll see one of my partners next week.  I'll see you in 3 months!  Take care and stay well.  I think you're doing great!  Dr. Jimmye Norman

## 2022-02-06 NOTE — Assessment & Plan Note (Signed)
a1C 7.3 today.  HOme monitoring reviewed, often above goal.  Dietary indiscretion remains problematic. Increase ozempic today for weight loss.

## 2022-02-06 NOTE — Assessment & Plan Note (Signed)
TAking lasix as prescribed, having expected increased UOP.  Wears two pullups daily, 3 at night.  Saw urologist Dr. Matilde Sprang, who added oxybutynin LA to Myrbetric.  Has taken for the past two days, no difference yet, but has developed a dry mouth.  F/u there 03/21/22.  Catheter has been suggested though she doesn't feel she'd be able to manage it, and wouldn't feel comfortable with her sons helping.

## 2022-02-06 NOTE — Progress Notes (Signed)
Applied unna boot to RLE

## 2022-02-06 NOTE — Assessment & Plan Note (Signed)
She desires cessation of buproprion.  Ineffective.  SHe has already stopped.  Mood is stable, she has benefited from therapy with Dr. Theodis Shove.

## 2022-02-06 NOTE — Assessment & Plan Note (Signed)
Surgical site well healed.  Progressing with PT.

## 2022-02-06 NOTE — Assessment & Plan Note (Signed)
"  I'm going to walk this year!" She is already walking short household distances with rollator.     Not using her manual WC out of the house as she can't  get it down  the steps outside.  If she gets a power WC for community use, she will need a ramp.  [employer] has approved a motorized WC, but her copay would be 1700 which she can't afford.

## 2022-02-06 NOTE — Assessment & Plan Note (Signed)
Continues to consume empty calories, snacks, sodas.  Increase Ozempic.

## 2022-02-06 NOTE — Assessment & Plan Note (Signed)
Recovered adequately to proceed with f/u colonoscopy.  WIll place referral.

## 2022-02-06 NOTE — Assessment & Plan Note (Signed)
Pain and limitation in ROM persists, no improvement with the voltaren gel.  She is managing, though may need referral to sports medicine for additional evaluation.  No interventions today.

## 2022-02-07 ENCOUNTER — Encounter (HOSPITAL_BASED_OUTPATIENT_CLINIC_OR_DEPARTMENT_OTHER): Payer: Medicare Other | Admitting: Internal Medicine

## 2022-02-07 ENCOUNTER — Other Ambulatory Visit: Payer: Self-pay

## 2022-02-07 DIAGNOSIS — L98498 Non-pressure chronic ulcer of skin of other sites with other specified severity: Secondary | ICD-10-CM

## 2022-02-07 DIAGNOSIS — E11622 Type 2 diabetes mellitus with other skin ulcer: Secondary | ICD-10-CM | POA: Diagnosis not present

## 2022-02-07 DIAGNOSIS — T23201A Burn of second degree of right hand, unspecified site, initial encounter: Secondary | ICD-10-CM | POA: Diagnosis not present

## 2022-02-07 DIAGNOSIS — T23221D Burn of second degree of single right finger (nail) except thumb, subsequent encounter: Secondary | ICD-10-CM | POA: Diagnosis not present

## 2022-02-07 LAB — BMP8+ANION GAP
Anion Gap: 14 mmol/L (ref 10.0–18.0)
BUN/Creatinine Ratio: 10 — ABNORMAL LOW (ref 12–28)
BUN: 9 mg/dL (ref 8–27)
CO2: 23 mmol/L (ref 20–29)
Calcium: 8.8 mg/dL (ref 8.7–10.3)
Chloride: 105 mmol/L (ref 96–106)
Creatinine, Ser: 0.86 mg/dL (ref 0.57–1.00)
Glucose: 153 mg/dL — ABNORMAL HIGH (ref 70–99)
Potassium: 4 mmol/L (ref 3.5–5.2)
Sodium: 142 mmol/L (ref 134–144)
eGFR: 76 mL/min/{1.73_m2} (ref >59)

## 2022-02-07 LAB — MICROALBUMIN / CREATININE URINE RATIO
Creatinine, Urine: 120 mg/dL
Microalb/Creat Ratio: 241 mg/g creat — ABNORMAL HIGH (ref 0–29)
Microalbumin, Urine: 289.6 ug/mL

## 2022-02-07 NOTE — Progress Notes (Signed)
Jennifer [name], Jennifer [name] ([id number]) Visit Report for 02/07/2022 Arrival Information Details Patient Name: Date of Service: Jennifer [name], Jennifer [name] 02/07/2022 10:15 A M Medical Record Number: [id number] Patient Account Number: [id number] Date of Birth/Sex: Treating RN: 21-Feb-1961 (61 y.o. Tonita Phoenix, Lauren Primary Care Abdiel Blackerby: [name] Other Clinician: Referring Oriel Ojo: Treating Antoninette Lerner/Extender: [name] in Treatment: 7 Visit Information History Since Last Visit Added or deleted any medications: No Patient Arrived: Ambulatory Any new allergies or adverse reactions: No Arrival Time: 10:34 Had a fall or experienced change in No Accompanied By: self activities of daily living that may affect Transfer Assistance: None risk of falls: Patient Identification Verified: Yes Signs or symptoms of abuse/neglect since last visito No Secondary Verification Process Completed: Yes Hospitalized since last visit: No Patient Requires Transmission-Based Precautions: No Implantable device outside of the clinic excluding No Patient Has Alerts: No cellular tissue based products placed in the center since last visit: Has Dressing in Place as Prescribed: Yes Pain Present Now: Yes Electronic Signature(s) Signed: 02/07/2022 12:52:20 PM By: Rhae Hammock RN Entered By: Rhae Hammock on 02/07/2022 10:35:32 -------------------------------------------------------------------------------- Clinic Level of Care Assessment Details Patient Name: Date of Service: Jennifer [name] 02/07/2022 10:15 A M Medical Record Number: [id number] Patient Account Number: [id number] Date of Birth/Sex: Treating RN: February 17, 1961 (61 y.o. Jennifer [name] Primary Care Arva Slaugh: [name] Other Clinician: Referring Sherika Kubicki: Treating Jezreel Sisk/Extender: [name] in Treatment: 62 Clinic Level of Care Assessment Items TOOL 4 Quantity Score X- 1  0 Use when only an EandM is performed on FOLLOW-UP visit ASSESSMENTS - Nursing Assessment / Reassessment X- 1 10 Reassessment of Co-morbidities (includes updates in patient status) X- 1 5 Reassessment of Adherence to Treatment Plan ASSESSMENTS - Wound and Skin A ssessment / Reassessment X - Simple Wound Assessment / Reassessment - one wound 1 5 []  - 0 Complex Wound Assessment / Reassessment - multiple wounds []  - 0 Dermatologic / Skin Assessment (not related to wound area) ASSESSMENTS - Focused Assessment []  - 0 Circumferential Edema Measurements - multi extremities []  - 0 Nutritional Assessment / Counseling / Intervention []  - 0 Lower Extremity Assessment (monofilament, tuning fork, pulses) []  - 0 Peripheral Arterial Disease Assessment (using hand held doppler) ASSESSMENTS - Ostomy and/or Continence Assessment and Care []  - 0 Incontinence Assessment and Management []  - 0 Ostomy Care Assessment and Management (repouching, etc.) PROCESS - Coordination of Care X - Simple Patient / Family Education for ongoing care 1 15 []  - 0 Complex (extensive) Patient / Family Education for ongoing care X- 1 10 Staff obtains Programmer, systems, Records, T Results / Process Orders est X- 1 10 Staff telephones HHA, Nursing Homes / Clarify orders / etc []  - 0 Routine Transfer to another Facility (non-emergent condition) []  - 0 Routine Hospital Admission (non-emergent condition) []  - 0 New Admissions / Biomedical engineer / Ordering NPWT Apligraf, etc. , []  - 0 Emergency Hospital Admission (emergent condition) []  - 0 Simple Discharge Coordination []  - 0 Complex (extensive) Discharge Coordination PROCESS - Special Needs []  - 0 Pediatric / Minor Patient Management []  - 0 Isolation Patient Management []  - 0 Hearing / Language / Visual special needs []  - 0 Assessment of Community assistance (transportation, D/C planning, etc.) []  - 0 Additional assistance / Altered mentation []  -  0 Support Surface(s) Assessment (bed, cushion, seat, etc.) INTERVENTIONS - Wound Cleansing / Measurement X - Simple Wound Cleansing - one wound 1 5 []  - 0 Complex Wound Cleansing -  multiple wounds X- 1 5 Wound Imaging (photographs - any number of wounds) []  - 0 Wound Tracing (instead of photographs) X- 1 5 Simple Wound Measurement - one wound []  - 0 Complex Wound Measurement - multiple wounds INTERVENTIONS - Wound Dressings []  - 0 Small Wound Dressing one or multiple wounds []  - 0 Medium Wound Dressing one or multiple wounds []  - 0 Large Wound Dressing one or multiple wounds []  - 0 Application of Medications - topical []  - 0 Application of Medications - injection INTERVENTIONS - Miscellaneous []  - 0 External ear exam []  - 0 Specimen Collection (cultures, biopsies, blood, body fluids, etc.) []  - 0 Specimen(s) / Culture(s) sent or taken to Lab for analysis []  - 0 Patient Transfer (multiple staff / Civil Service fast streamer / Similar devices) []  - 0 Simple Staple / Suture removal (25 or less) []  - 0 Complex Staple / Suture removal (26 or more) []  - 0 Hypo / Hyperglycemic Management (close monitor of Blood Glucose) []  - 0 Ankle / Brachial Index (ABI) - do not check if billed separately X- 1 5 Vital Signs Has the patient been seen at the hospital within the last three years: Yes Total Score: 75 Level Of Care: New/Established - Level 2 Electronic Signature(s) Signed: 02/07/2022 12:31:49 PM By: Dellie Catholic RN Entered By: Dellie Catholic on 02/07/2022 12:29:40 -------------------------------------------------------------------------------- Encounter Discharge Information Details Patient Name: Date of Service: Jennifer Reader L. 02/07/2022 10:15 A M Medical Record Number: [id number] Patient Account Number: [id number] Date of Birth/Sex: Treating RN: Jul 20, 1961 (61 y.o. Jennifer [name] Primary Care Kalieb Freeland: [name] Other Clinician: Referring Caprisha Bridgett: Treating  Dosia Yodice/Extender: [name] in Treatment: 22 Encounter Discharge Information Items Discharge Condition: Stable Ambulatory Status: Walker Discharge Destination: Home Transportation: Private Auto Accompanied By: self Schedule Follow-up Appointment: Yes Clinical Summary of Care: Patient Declined Electronic Signature(s) Signed: 02/07/2022 12:31:49 PM By: Dellie Catholic RN Entered By: Dellie Catholic on 02/07/2022 12:31:07 -------------------------------------------------------------------------------- Lower Extremity Assessment Details Patient Name: Date of Service: Jennifer [name], Jennifer [name] 02/07/2022 10:15 A M Medical Record Number: [id number] Patient Account Number: [id number] Date of Birth/Sex: Treating RN: 11/13/1961 (61 y.o. Tonita Phoenix, Lauren Primary Care Yulonda Wheeling: [name] Other Clinician: Referring Raequan Vanschaick: Treating Miasha Emmons/Extender: [name] in Treatment: 22 Electronic Signature(s) Signed: 02/07/2022 12:52:20 PM By: Rhae Hammock RN Entered By: Rhae Hammock on 02/07/2022 10:37:07 -------------------------------------------------------------------------------- Multi Wound Chart Details Patient Name: Date of Service: Jennifer Reader L. 02/07/2022 10:15 A M Medical Record Number: [id number] Patient Account Number: [id number] Date of Birth/Sex: Treating RN: 07-Jan-1961 (61 y.o. F) Primary Care Nikole Swartzentruber: [name] Other Clinician: Referring Destiny Hagin: Treating Adonai Selsor/Extender: [name] in Treatment: 22 Vital Signs Height(in): 56 Pulse(bpm): 48 Weight(lbs): 240 Blood Pressure(mmHg): 135/74 Body Mass Index(BMI): 37.6 Temperature(F): 98.9 Respiratory Rate(breaths/min): 17 Photos: [N/A:N/A] Right Hand - 2nd Digit N/A N/A Wound Location: Blister N/A N/A Wounding Event: 2nd degree Burn N/A N/A Primary Etiology: Cataracts, Glaucoma, Chronic sinus N/A  N/A Comorbid History: problems/congestion, Anemia, Asthma, Chronic Obstructive Pulmonary Disease (COPD), Congestive Heart Failure, Hypertension, Peripheral Arterial Disease, Peripheral Venous Disease, Type II Diabetes, Rheumatoid Arthritis, Osteoarthritis, Osteomyelitis, Neuropathy, Seizure Disorder 11/19/2021 N/A N/A Date Acquired: 7 N/A N/A Weeks of Treatment: Healed - Epithelialized N/A N/A Wound Status: No N/A N/A Wound Recurrence: 0x0x0 N/A N/A Measurements L x W x D (cm) 0 N/A N/A A (cm) : rea 0 N/A N/A Volume (cm) : 100.00% N/A N/A % Reduction in Area: 100.00% N/A N/A %  Reduction in Volume: Full Thickness Without Exposed N/A N/A Classification: Support Structures None Present N/A N/A Exudate Amount: Distinct, outline attached N/A N/A Wound Margin: None Present (0%) N/A N/A Granulation Amount: None Present (0%) N/A N/A Necrotic Amount: Fascia: No N/A N/A Exposed Structures: Fat Layer (Subcutaneous Tissue): No Tendon: No Muscle: No Joint: No Bone: No Large (67-100%) N/A N/A Epithelialization: Treatment Notes Electronic Signature(s) Signed: 02/07/2022 12:02:12 PM By: Kalman Shan DO Entered By: Kalman Shan on 02/07/2022 11:56:32 -------------------------------------------------------------------------------- Multi-Disciplinary Care Plan Details Patient Name: Date of Service: Jennifer Reader L. 02/07/2022 10:15 A M Medical Record Number: [id number] Patient Account Number: [id number] Date of Birth/Sex: Treating RN: Dec 13, 1961 (61 y.o. Jennifer [name] Primary Care Semir Brill: Other Clinician: [name] Referring Yerick Eggebrecht: Treating Zyiere Rosemond/Extender: [name] in Treatment: [place] reviewed with physician Active Inactive Electronic Signature(s) Signed: 02/07/2022 12:31:49 PM By: Dellie Catholic RN Entered By: Dellie Catholic on 02/07/2022  12:27:30 -------------------------------------------------------------------------------- Pain Assessment Details Patient Name: Date of Service: Jennifer [name], Jennifer [name] 02/07/2022 10:15 A M Medical Record Number: [id number] Patient Account Number: [id number] Date of Birth/Sex: Treating RN: Mar 16, 1961 (61 y.o. Tonita Phoenix, Lauren Primary Care Jemal Miskell: [name] Other Clinician: Referring Teshawn Moan: Treating Glenice Ciccone/Extender: [name] in Treatment: 22 Active Problems Location of Pain Severity and Description of Pain Patient Has Paino Yes Site Locations Pain Location: Generalized Pain, Pain in Ulcers With Dressing Change: Yes Duration of the Pain. Constant / Intermittento Constant Rate the pain. Current Pain Level: 8 Worst Pain Level: 10 Least Pain Level: 0 Tolerable Pain Level: 8 Character of Pain Describe the Pain: Aching Pain Management and Medication Current Pain Management: Medication: No Cold Application: No Rest: No Massage: No Activity: No T.E.N.S.: No Heat Application: No Leg drop or elevation: No Is the Current Pain Management Adequate: Adequate How does your wound impact your activities of daily livingo Sleep: No Bathing: No Appetite: No Relationship With Others: No Bladder Continence: No Emotions: No Bowel Continence: No Work: No Toileting: No Drive: No Dressing: No Hobbies: No Electronic Signature(s) Signed: 02/07/2022 12:52:20 PM By: Rhae Hammock RN Entered By: Rhae Hammock on 02/07/2022 10:36:56 -------------------------------------------------------------------------------- Patient/Caregiver Education Details Patient Name: Date of Service: Jennifer [name] 2/17/2023andnbsp10:15 A M Medical Record Number: [id number] Patient Account Number: [id number] Date of Birth/Gender: Treating RN: Sep 23, 1961 (60 y.o. Jennifer [name] Primary Care Physician: [name] Other Clinician: Referring  Physician: Treating Physician/Extender: [name] in Treatment: 22 Education Assessment Education Provided To: Patient Education Topics Provided Electronic Signature(s) Signed: 02/07/2022 12:31:49 PM By: Dellie Catholic RN Entered By: Dellie Catholic on 02/07/2022 12:27:51 -------------------------------------------------------------------------------- Wound Assessment Details Patient Name: Date of Service: Jennifer [name], Jennifer [name] 02/07/2022 10:15 A M Medical Record Number: [id number] Patient Account Number: [id number] Date of Birth/Sex: Treating RN: [birth year] (61 y.o. Tonita Phoenix, Lauren Primary Care Sharmayne Jablon: [name] Other Clinician: Referring Arpita Fentress: Treating Attilio Zeitler/Extender: [name] in Treatment: 22 Wound Status Wound Number: 24 Primary 2nd degree Burn Etiology: Wound Location: Right Hand - 2nd Digit Wound Healed - Epithelialized Wounding Event: Blister Status: Date Acquired: 11/19/2021 Comorbid Cataracts, Glaucoma, Chronic sinus problems/congestion, Anemia, Weeks Of Treatment: 7 History: Asthma, Chronic Obstructive Pulmonary Disease (COPD), Clustered Wound: No Congestive Heart Failure, Hypertension, Peripheral Arterial Disease, Peripheral Venous Disease, Type II Diabetes, Rheumatoid Arthritis, Osteoarthritis, Osteomyelitis, Neuropathy, Seizure Disorder Photos Wound Measurements Length: (cm) Width: (cm) Depth: (cm) Area: (cm) Volume: (cm) 0 % Reduction in Area: 100% 0 %  Reduction in Volume: 100% 0 Epithelialization: Large (67-100%) 0 Tunneling: No 0 Undermining: No Wound Description Classification: Full Thickness Without Exposed Support Structures Wound Margin: Distinct, outline attached Exudate Amount: None Present Foul Odor After Cleansing: No Slough/Fibrino No Wound Bed Granulation Amount: None Present (0%) Exposed Structure Necrotic Amount: None Present (0%) Fascia Exposed: No Fat  Layer (Subcutaneous Tissue) Exposed: No Tendon Exposed: No Muscle Exposed: No Joint Exposed: No Bone Exposed: No Electronic Signature(s) Signed: 02/07/2022 12:31:49 PM By: Dellie Catholic RN Signed: 02/07/2022 12:52:20 PM By: Rhae Hammock RN Entered By: Dellie Catholic on 02/07/2022 11:10:58 -------------------------------------------------------------------------------- Vitals Details Patient Name: Date of Service: Jennifer Reader L. 02/07/2022 10:15 A M Medical Record Number: [id number] Patient Account Number: [id number] Date of Birth/Sex: Treating RN: 04/29/1961 (61 y.o. Tonita Phoenix, Lauren Primary Care Cortny Bambach: [name] Other Clinician: Referring Falana Clagg: Treating Rebecah Dangerfield/Extender: [name] in Treatment: 22 Vital Signs Time Taken: 10:35 Temperature (F): 98.9 Height (in): 67 Pulse (bpm): 82 Weight (lbs): 240 Respiratory Rate (breaths/min): 17 Body Mass Index (BMI): 37.6 Blood Pressure (mmHg): 135/74 Reference Range: 80 - 120 mg / dl Electronic Signature(s) Signed: 02/07/2022 12:52:20 PM By: Rhae Hammock RN Entered By: Rhae Hammock on 02/07/2022 10:36:08

## 2022-02-07 NOTE — Progress Notes (Addendum)
BALERIA, WYMAN ([id number]) Visit Report for 02/07/2022 Chief Complaint Document Details Patient Name: Date of Service: Jennifer [name], Jennifer [name] 02/07/2022 10:15 A M Medical Record Number: [id number] Patient Account Number: [id number] Date of Birth/Sex: Treating RN: 1961-05-23 (61 y.o. F) Primary Care Provider: [name] Other Clinician: Referring Provider: Treating Provider/Extender: [name] in Treatment: 22 Information Obtained from: Patient Chief Complaint 10/29/2021; patient presents for area of skin breakdown to her right lower extremity 12/30; patient presents for burn to her right second finger Electronic Signature(s) Signed: 02/07/2022 12:02:12 PM By: Kalman Shan DO Entered By: Kalman Shan on 02/07/2022 11:57:14 -------------------------------------------------------------------------------- HPI Details Patient Name: Date of Service: Jennifer Reader L. 02/07/2022 10:15 A M Medical Record Number: [id number] Patient Account Number: [id number] Date of Birth/Sex: Treating RN: 01/21/1961 (61 y.o. F) Primary Care Provider: [name] Other Clinician: Referring Provider: Treating Provider/Extender: [name] in Treatment: 22 History of Present Illness HPI Description: Admission 10/29/2021 Ms. Rebekah Zackery is a 61 year old female with a past medical history of insulin-dependent type 2 diabetes, left lower extremity BKA, right partial fifth ray amputation that presents today for a small open wound to her right lower extremity. She was recently hospitalized on 10/24/2021 for acute on chronic HFpEF. She was noted to Have a small open wound with weeping to her right lower extremity. She was diuresed and also placed in an Haematologist. She reports no issues since discharge. She states that the swelling on her right lower extremity has improved. She currently denies signs of infection. 11/15; patient presents for  follow-up. She has no issues or complaints today. She reports her wound is healed and has her compression stocking today. Readmission 12/20/2021 Patient presents with a new wound to her right second finger caused by a burn 1 month ago. She has been using antibiotic ointment on the site. She reports no improvement in wound healing over the past month. She currently denies signs of infection. 01/03/2022; patient presents for follow-up. She was able to obtain Santyl and states she is using this with Hydrofera Blue daily. She has no issues or complaints today. She denies signs of infection. 2/3; patient presents for follow-up. She has been using Santyl with Hydrofera Blue daily. She has no issues or complaints today. She recently had a manicure. She denies signs of infection. 2/17; patient presents for follow-up. She has been using Santyl daily to the wound bed. She reports improvement in wound healing. She has no issues or complaints today. She denies signs of infection. Electronic Signature(s) Signed: 02/07/2022 12:02:12 PM By: Kalman Shan DO Entered By: Kalman Shan on 02/07/2022 11:57:49 -------------------------------------------------------------------------------- Physical Exam Details Patient Name: Date of Service: Jennifer [name] 02/07/2022 10:15 A M Medical Record Number: [id number] Patient Account Number: [id number] Date of Birth/Sex: Treating RN: 12-27-1960 (61 y.o. F) Primary Care Provider: [name] Other Clinician: Referring Provider: Treating Provider/Extender: [name] in Treatment: 22 Constitutional respirations regular, non-labored and within target range for patient.Marland Kitchen Psychiatric pleasant and cooperative. Notes Right second finger with epithelization to the previous wound site. Electronic Signature(s) Signed: 02/07/2022 12:02:12 PM By: Kalman Shan DO Entered By: Kalman Shan on 02/07/2022  11:58:55 -------------------------------------------------------------------------------- Physician Orders Details Patient Name: Date of Service: Jennifer Reader L. 02/07/2022 10:15 A M Medical Record Number: [id number] Patient Account Number: [id number] Date of Birth/Sex: Treating RN: Aug 21, 1961 (61 y.o. [name] Primary Care Provider: [name] Other Clinician: Referring Provider: Treating Provider/Extender: Heber Florence  Laurey Morale, Almyra Free Weeks in Treatment: 22 Verbal / Phone Orders: No Diagnosis Coding Discharge From [facility] Services Discharge from [place] your wound is healed!!!! Electronic Signature(s) Signed: 02/07/2022 12:02:12 PM By: Kalman Shan DO Entered By: Kalman Shan on 02/07/2022 11:59:09 -------------------------------------------------------------------------------- Problem List Details Patient Name: Date of Service: Jennifer Reader L. 02/07/2022 10:15 A M Medical Record Number: [id number] Patient Account Number: [id number] Date of Birth/Sex: Treating RN: Feb 18, 1961 (61 y.o. F) Primary Care Provider: [name] Other Clinician: Referring Provider: Treating Provider/Extender: [name] in Treatment: 22 Active Problems ICD-10 Encounter Code Description Active Date MDM Diagnosis T23.201A Burn of second degree of right hand, unspecified site, initial encounter 12/20/2021 No Yes L98.498 Non-pressure chronic ulcer of skin of other sites with other specified severity 12/20/2021 No Yes E11.622 Type 2 diabetes mellitus with other skin ulcer 09/03/2021 No Yes I73.9 Peripheral vascular disease, unspecified 09/03/2021 No Yes O17.51 Chronic diastolic (congestive) heart failure 09/03/2021 No Yes Z89.512 Acquired absence of left leg below knee 09/03/2021 No Yes Z89.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) 09/03/2021 No Yes Z79.891 Long term (current) use of opiate analgesic 09/03/2021 No Yes Inactive  Problems ICD-10 Code Description Active Date Inactive Date S91.301A Unspecified open wound, right foot, initial encounter 09/03/2021 09/03/2021 L97.811 Non-pressure chronic ulcer of other part of right lower leg limited to breakdown of skin 10/29/2021 10/29/2021 Resolved Problems ICD-10 Code Description Active Date Resolved Date E11.621 Type 2 diabetes mellitus with foot ulcer 09/03/2021 09/03/2021 S91.301D Unspecified open wound, right foot, subsequent encounter 09/10/2021 09/10/2021 Electronic Signature(s) Signed: 02/07/2022 12:02:12 PM By: Kalman Shan DO Entered By: Kalman Shan on 02/07/2022 11:56:26 -------------------------------------------------------------------------------- Progress Note Details Patient Name: Date of Service: Jennifer Reader L. 02/07/2022 10:15 A M Medical Record Number: [id number] Patient Account Number: [id number] Date of Birth/Sex: Treating RN: May 30, 1961 (61 y.o. F) Primary Care Provider: [name] Other Clinician: Referring Provider: Treating Provider/Extender: [name] in Treatment: 22 Subjective Chief Complaint Information obtained from Patient 10/29/2021; patient presents for area of skin breakdown to her right lower extremity 12/30; patient presents for burn to her right second finger History of Present Illness (HPI) Admission 10/29/2021 Ms. Brandelyn Henne is a 61 year old female with a past medical history of insulin-dependent type 2 diabetes, left lower extremity BKA, right partial fifth ray amputation that presents today for a small open wound to her right lower extremity. She was recently hospitalized on 10/24/2021 for acute on chronic HFpEF. She was noted to Have a small open wound with weeping to her right lower extremity. She was diuresed and also placed in an Haematologist. She reports no issues since discharge. She states that the swelling on her right lower extremity has improved. She currently denies signs of  infection. 11/15; patient presents for follow-up. She has no issues or complaints today. She reports her wound is healed and has her compression stocking today. Readmission 12/20/2021 Patient presents with a new wound to her right second finger caused by a burn 1 month ago. She has been using antibiotic ointment on the site. She reports no improvement in wound healing over the past month. She currently denies signs of infection. 01/03/2022; patient presents for follow-up. She was able to obtain Santyl and states she is using this with Hydrofera Blue daily. She has no issues or complaints today. She denies signs of infection. 2/3; patient presents for follow-up. She has been using Santyl with Hydrofera Blue daily. She has no issues or  complaints today. She recently had a manicure. She denies signs of infection. 2/17; patient presents for follow-up. She has been using Santyl daily to the wound bed. She reports improvement in wound healing. She has no issues or complaints today. She denies signs of infection. Patient History Information obtained from Patient. Family History Diabetes - Mother, Heart Disease - Mother, Hypertension - Mother, Seizures - Siblings, Stroke - Siblings, No family history of Cancer, Hereditary Spherocytosis, Kidney Disease, Lung Disease, Thyroid Problems, Tuberculosis. Social History Current every day smoker, Marital Status - Divorced, Alcohol Use - Never, Drug Use - No History, Caffeine Use - Moderate. 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 Denies history of Optic Neuritis Ear/Nose/Mouth/Throat Patient has history of Chronic sinus problems/congestion - Flonase Denies history of Middle ear problems Hematologic/Lymphatic Patient has history of Anemia Denies history of Hemophilia, Human Immunodeficiency Virus, Lymphedema, Sickle Cell Disease Respiratory Patient has history of Asthma, Chronic Obstructive Pulmonary Disease  (COPD) Denies history of Aspiration Cardiovascular Patient has history of Congestive Heart Failure - chronic diastolic, Hypertension, Peripheral Arterial Disease, 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 Denies history of Angina, Arrhythmia, Coronary Artery Disease, Deep Vein Thrombosis, Hypotension, Myocardial Infarction, Phlebitis, Vasculitis Gastrointestinal Denies history of Cirrhosis , Colitis, Crohnoos, Hepatitis A, Hepatitis B, Hepatitis C Endocrine Patient has history of Type II Diabetes - uncontrolled w/ side effects Denies history of Type I Diabetes Genitourinary Denies history of End Stage Renal Disease Immunological Denies history of Lupus Erythematosus, Raynaudoos, Scleroderma Integumentary (Skin) Denies history of History of Burn Musculoskeletal Patient has history of Rheumatoid Arthritis, Osteoarthritis - spine, Osteomyelitis - hx left foot Denies history of Gout Neurologic Patient has history of Neuropathy, Seizure Disorder Denies history of Dementia, Quadriplegia, Paraplegia Oncologic Denies history of Received Chemotherapy, Received Radiation Psychiatric Denies history of Anorexia/bulimia, Confinement Anxiety Hospitalization/Surgery History - left leg amputated. - COPD, PNA. 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 Medical A Surgical History Notes nd Constitutional Symptoms (General Health) chronic insomnia , opioid dependence , gait abnormality , severe obesity , s/p BKA , Eyes diabetic retinopathy Ear/Nose/Mouth/Throat oral mucosa lesions Hematologic/Lymphatic thrombocytopenia Cardiovascular hyperlipidemia , infective endocarditis , aortic atherosclerosis , Gastrointestinal GERD Integumentary (Skin) foot ulcer Neurologic childhood seizures Hx Bells Palsy Psychiatric depression Objective Constitutional respirations regular, non-labored and within target range for patient.. Vitals Time Taken: 10:35 AM, Height: 67 in, Weight: 240 lbs, BMI: 37.6,  Temperature: 98.9 F, Pulse: 82 bpm, Respiratory Rate: 17 breaths/min, Blood Pressure: 135/74 mmHg. Psychiatric pleasant and cooperative. General Notes: Right second finger with epithelization to the previous wound site. Integumentary (Hair, Skin) Wound #24 status is Healed - Epithelialized. Original cause of wound was Blister. The date acquired was: 11/19/2021. The wound has been in treatment 7 weeks. The wound is located on the Right Hand - 2nd Digit. The wound measures 0cm length x 0cm width x 0cm depth; 0cm^2 area and 0cm^3 volume. There is no tunneling or undermining noted. There is a none present amount of drainage noted. The wound margin is distinct with the outline attached to the wound base. There is no granulation within the wound bed. There is no necrotic tissue within the wound bed. 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 Her wound has healed. At this time I recommended Vaseline to the previous wound site to keep it from drying and cracking. She knows to call with any questions or concerns. She can follow-up as needed. Plan Discharge From [facility] Services: Discharge from [place] your wound is healed!!!! 1. Discharge from center due to closed wound 2. Follow-up as needed Electronic Signature(s) Signed: 02/07/2022 12:02:12 PM By: Kalman Shan DO Entered By: Kalman Shan on 02/07/2022 12:00:26 -------------------------------------------------------------------------------- HxROS Details Patient Name: Date of  Service: Jennifer Reader L. 02/07/2022 10:15 A M Medical Record Number: [id number] Patient Account Number: [id number] Date of Birth/Sex: Treating RN: 1961-05-24 (61 y.o. 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 Negative for: Optic Neuritis Past Medical History Notes: diabetic retinopathy Ear/Nose/Mouth/Throat Medical History: Positive for: Chronic sinus problems/congestion - Flonase Negative for: Middle ear problems Past Medical History Notes: oral mucosa lesions Hematologic/Lymphatic Medical History: Positive for: Anemia Negative for: Hemophilia; Human Immunodeficiency Virus; Lymphedema; Sickle Cell Disease Past Medical History Notes: thrombocytopenia Respiratory Medical History: Positive for: Asthma; Chronic Obstructive Pulmonary Disease (COPD) Negative for: Aspiration Cardiovascular Medical History: Positive for: Congestive Heart Failure - chronic diastolic; Hypertension; Peripheral Arterial Disease; 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 Negative for: Angina; Arrhythmia; Coronary Artery Disease; Deep Vein Thrombosis; Hypotension; Myocardial Infarction; Phlebitis; Vasculitis Past Medical History Notes: hyperlipidemia , infective endocarditis , aortic atherosclerosis , Gastrointestinal Medical History: Negative for: Cirrhosis ; Colitis; Crohns; Hepatitis A; Hepatitis B; Hepatitis C Past Medical History Notes: GERD Endocrine Medical History: Positive for: Type II Diabetes - uncontrolled w/ side effects Negative for: Type I Diabetes Time with diabetes: 1993 Treated with:  Insulin Blood sugar tested every day: Yes Tested : daily Genitourinary Medical History: Negative for: End Stage Renal Disease Immunological Medical History: Negative for: Lupus Erythematosus; Raynauds; Scleroderma Integumentary (Skin) Medical History: Negative for: History of Burn Past Medical History Notes: foot ulcer Musculoskeletal Medical History: Positive for: Rheumatoid Arthritis; Osteoarthritis - spine; Osteomyelitis - hx left foot Negative for: Gout Neurologic Medical History: Positive for: Neuropathy; Seizure Disorder Negative for: Dementia; Quadriplegia; Paraplegia Past Medical History Notes: childhood seizures Hx Bells Palsy Oncologic Medical History: Negative for: Received Chemotherapy; Received Radiation Psychiatric Medical History: Negative for: Anorexia/bulimia; Confinement Anxiety Past Medical History Notes: depression HBO Extended History Items Ear/Nose/Mouth/Throat: Eyes: Eyes: Chronic sinus Cataracts Glaucoma problems/congestion Immunizations Pneumococcal Vaccine: Received Pneumococcal Vaccination: Yes Received Pneumococcal Vaccination On or After 60th Birthday: No Immunization Notes: tetanus shot in 2014 Implantable Devices None Hospitalization / Surgery History Type of Hospitalization/Surgery left leg amputated COPD, 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 Family and Social History Cancer: No; Diabetes: Yes - Mother; Heart Disease: Yes - Mother; Hereditary Spherocytosis: No; Hypertension: Yes - Mother; Kidney Disease: No; Lung Disease: No; Seizures: Yes - Siblings; Stroke: Yes - Siblings; Thyroid Problems: No; Tuberculosis: No; Current every day smoker; Marital Status - Divorced; Alcohol Use: Never; Drug Use: No History; Caffeine Use: Moderate; Financial Concerns: No; Food, Clothing or Shelter Needs: No; Support System Lacking: No; Transportation Concerns: No Electronic Signature(s) Signed: 02/07/2022 12:02:12 PM By: Kalman Shan  DO Entered By:  Kalman Shan on 02/07/2022 11:57:57 -------------------------------------------------------------------------------- SuperBill Details Patient Name: Date of Service: FA JOSELINNE, LAWAL 02/07/2022 Medical Record Number: [id number] Patient Account Number: [id number] Date of Birth/Sex: Treating RN: 10/19/1961 (61 y.o. F) Primary Care Provider: [name] Other Clinician: Referring Provider: Treating Provider/Extender: [name] in Treatment: 22 Diagnosis Coding ICD-10 Codes Code Description X83.291B Burn of second degree of right hand, unspecified site, initial encounter L98.498 Non-pressure chronic ulcer of skin of other sites with other specified severity E11.622 Type 2 diabetes mellitus with other skin ulcer I73.9 Peripheral vascular disease, unspecified T66.06 Chronic diastolic (congestive) heart failure Z89.512 Acquired absence of left leg below knee Z89.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) Z79.891 Long term (current) use of opiate analgesic Facility Procedures CPT4 Code: [id number] Description: [phone] - WOUND CARE VISIT-LEV 2 NEW PT Modifier: Quantity: 1 Physician Procedures : CPT4 Code Description Modifier [id number] - WC PHYS LEVEL 3 - EST PT ICD-10 Diagnosis Description T23.201A Burn of second degree of right hand, unspecified site, initial encounter L98.498 Non-pressure chronic ulcer of skin of other sites with other  specified severity E11.622 Type 2 diabetes mellitus with other skin ulcer Quantity: 1 Electronic Signature(s) Signed: 02/07/2022 12:31:05 PM By: Kalman Shan DO Signed: 02/07/2022 12:31:49 PM By: Dellie Catholic RN Previous Signature: 02/07/2022 12:02:12 PM Version By: Kalman Shan DO Entered By: Dellie Catholic on 02/07/2022 12:30:09

## 2022-02-10 ENCOUNTER — Telehealth: Payer: Self-pay | Admitting: Internal Medicine

## 2022-02-10 NOTE — Telephone Encounter (Signed)
Couldn't leave mssg for [facility] regarding last weeks lab results - vm full.  I had intended to tell her that her kidney function is looking very good!

## 2022-02-11 ENCOUNTER — Ambulatory Visit: Payer: Medicare Other | Admitting: Dietician

## 2022-02-11 ENCOUNTER — Encounter: Payer: Self-pay | Admitting: Internal Medicine

## 2022-02-11 ENCOUNTER — Encounter: Payer: Self-pay | Admitting: Dietician

## 2022-02-11 ENCOUNTER — Other Ambulatory Visit: Payer: Self-pay

## 2022-02-11 ENCOUNTER — Ambulatory Visit (INDEPENDENT_AMBULATORY_CARE_PROVIDER_SITE_OTHER): Payer: Medicare Other | Admitting: Internal Medicine

## 2022-02-11 VITALS — BP 112/69 | HR 80 | Temp 98.2°F | Resp 28 | Ht 65.0 in | Wt 243.4 lb

## 2022-02-11 DIAGNOSIS — N3946 Mixed incontinence: Secondary | ICD-10-CM | POA: Diagnosis not present

## 2022-02-11 DIAGNOSIS — I872 Venous insufficiency (chronic) (peripheral): Secondary | ICD-10-CM

## 2022-02-11 DIAGNOSIS — R300 Dysuria: Secondary | ICD-10-CM

## 2022-02-11 LAB — POCT URINALYSIS DIPSTICK
Bilirubin, UA: NEGATIVE
Glucose, UA: NEGATIVE
Nitrite, UA: NEGATIVE
Odor: ABNORMAL
Protein, UA: POSITIVE — AB
Spec Grav, UA: 1.015 (ref 1.010–1.025)
Urobilinogen, UA: 1 E.U./dL
pH, UA: 5.5 (ref 5.0–8.0)

## 2022-02-11 LAB — URINALYSIS, ROUTINE W REFLEX MICROSCOPIC
Bilirubin Urine: NEGATIVE
Glucose, UA: NEGATIVE mg/dL
Ketones, ur: NEGATIVE mg/dL
Nitrite: NEGATIVE
Protein, ur: 100 mg/dL — AB
Specific Gravity, Urine: 1.025 (ref 1.005–1.030)
pH: 6 (ref 5.0–8.0)

## 2022-02-11 LAB — URINALYSIS, MICROSCOPIC (REFLEX): WBC, UA: >50 WBC/hpf (ref 0–5)

## 2022-02-11 NOTE — Progress Notes (Signed)
This visit will be rescheduled. [name], RD 02/11/2022 5:03 PM.

## 2022-02-11 NOTE — Patient Instructions (Addendum)
Ms Jennifer [name],  It was a pleasure seeing you in clinic. Today we discussed:   Leg swelling:  We re-wrapped your [employer]. Continue to take all medications as prescribed. Follow up in 1 week.   Urine symptoms: I am checking on your urine for any signs of infection. I will call you with the results  If you have any questions or concerns, please call our clinic at [phone] between 9am-5pm and after hours call [phone] and ask for the internal medicine resident on call. If you feel you are having a medical emergency please call 911.   Thank you, we look forward to helping you remain healthy!
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
## 2022-02-12 ENCOUNTER — Ambulatory Visit: Payer: Medicare Other

## 2022-02-12 ENCOUNTER — Other Ambulatory Visit: Payer: Self-pay

## 2022-02-12 DIAGNOSIS — M6281 Muscle weakness (generalized): Secondary | ICD-10-CM

## 2022-02-12 DIAGNOSIS — Z89512 Acquired absence of left leg below knee: Secondary | ICD-10-CM

## 2022-02-12 DIAGNOSIS — R2689 Other abnormalities of gait and mobility: Secondary | ICD-10-CM

## 2022-02-12 MED ORDER — CEPHALEXIN 500 MG PO CAPS: 500.0000 mg | ORAL_CAPSULE | Freq: Four times a day (QID) | ORAL | 0 refills | Status: AC

## 2022-02-12 MED ORDER — CIPROFLOXACIN HCL 250 MG PO TABS: 250.0000 mg | ORAL_TABLET | Freq: Two times a day (BID) | ORAL | 0 refills | Status: DC
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
## 2022-02-12 NOTE — Assessment & Plan Note (Signed)
Patient is following up for lower extremity edema. She was evaluated for this with PCP on 2/16 and Unna boot was palced to prevent loss of skin integrity. Patient initially in [place]; when removed, continued to have persistent right lower extremity 2+ pitting edema with areas of skin discoloration without evidence of ulceration. Patient notes that her compression stocking is too tight.  Plan: Unna boot rewrapped by ortho techs Patient advised to continue lasix  Follow up in 1 week

## 2022-02-12 NOTE — Assessment & Plan Note (Addendum)
Patient reports dysuria since Friday evening. She denies any fevers or chills, flank pain, subprapubic pain. On examination, no suprapubic tenderness to palpation or CVA tenderness noted. Urinalysis obtained which is significant for moderate hemoglobinuria, moderate leukocytes and many bacteria with urine culture positive for >100,000 E.coli. Prior urine cultures with E.coli sensitive to cephalosporins.   Plan: Keflex 500mg  q6h x 5 days Repeat Urinalysis following resolution of symptoms to ensure that hemoglobinuria is resolved

## 2022-02-13 LAB — URINE CULTURE: Culture: >=100000 — AB

## 2022-02-13 NOTE — Progress Notes (Signed)
Internal Medicine Clinic Attending ° °Case discussed with Dr. Aslam  At the time of the visit.  We reviewed the resident’s history and exam and pertinent patient test results.  I agree with the assessment, diagnosis, and plan of care documented in the resident’s note.  °

## 2022-02-18 ENCOUNTER — Encounter: Payer: Self-pay | Admitting: Physical Therapy

## 2022-02-18 ENCOUNTER — Other Ambulatory Visit: Payer: Self-pay

## 2022-02-18 ENCOUNTER — Ambulatory Visit: Payer: Medicare Other | Admitting: Physical Therapy

## 2022-02-18 DIAGNOSIS — R2689 Other abnormalities of gait and mobility: Secondary | ICD-10-CM

## 2022-02-18 DIAGNOSIS — M6281 Muscle weakness (generalized): Secondary | ICD-10-CM
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
## 2022-02-19 ENCOUNTER — Encounter: Payer: Self-pay | Admitting: Internal Medicine

## 2022-02-20 ENCOUNTER — Ambulatory Visit: Payer: Medicare Other | Attending: Internal Medicine | Admitting: Physical Therapy

## 2022-02-20 ENCOUNTER — Encounter: Payer: Self-pay | Admitting: Physical Therapy

## 2022-02-20 ENCOUNTER — Encounter: Payer: Self-pay | Admitting: Internal Medicine

## 2022-02-20 ENCOUNTER — Ambulatory Visit (INDEPENDENT_AMBULATORY_CARE_PROVIDER_SITE_OTHER): Payer: Medicare Other | Admitting: Internal Medicine

## 2022-02-20 ENCOUNTER — Other Ambulatory Visit: Payer: Self-pay

## 2022-02-20 VITALS — BP 154/73 | HR 80 | Temp 98.1°F | Ht 65.0 in | Wt 243.0 lb

## 2022-02-20 DIAGNOSIS — E118 Type 2 diabetes mellitus with unspecified complications: Secondary | ICD-10-CM

## 2022-02-20 DIAGNOSIS — N3946 Mixed incontinence: Secondary | ICD-10-CM | POA: Diagnosis not present

## 2022-02-20 DIAGNOSIS — R300 Dysuria: Secondary | ICD-10-CM

## 2022-02-20 DIAGNOSIS — R2689 Other abnormalities of gait and mobility: Secondary | ICD-10-CM

## 2022-02-20 DIAGNOSIS — M545 Low back pain, unspecified: Secondary | ICD-10-CM | POA: Insufficient documentation

## 2022-02-20 DIAGNOSIS — G8929 Other chronic pain: Secondary | ICD-10-CM | POA: Diagnosis present

## 2022-02-20 DIAGNOSIS — Z794 Long term (current) use of insulin: Secondary | ICD-10-CM

## 2022-02-20 DIAGNOSIS — R823 Hemoglobinuria: Secondary | ICD-10-CM

## 2022-02-20 DIAGNOSIS — Z89512 Acquired absence of left leg below knee: Secondary | ICD-10-CM | POA: Diagnosis present

## 2022-02-20 DIAGNOSIS — I872 Venous insufficiency (chronic) (peripheral): Secondary | ICD-10-CM | POA: Diagnosis not present

## 2022-02-20 DIAGNOSIS — M6281 Muscle weakness (generalized): Secondary | ICD-10-CM | POA: Diagnosis present

## 2022-02-20 DIAGNOSIS — E1151 Type 2 diabetes mellitus with diabetic peripheral angiopathy without gangrene: Secondary | ICD-10-CM

## 2022-02-20 DIAGNOSIS — R296 Repeated falls: Secondary | ICD-10-CM | POA: Insufficient documentation

## 2022-02-20 MED ORDER — PHENAZOPYRIDINE HCL 200 MG PO TABS: 200.0000 mg | ORAL_TABLET | Freq: Three times a day (TID) | ORAL | 0 refills | Status: AC | PRN

## 2022-02-20 NOTE — Patient Instructions (Addendum)
Dear [name],  Thank you for trusting [place] with your care.  For your UTI , we can treat you with a medication called pyridium to help with your symptoms. If the infection from before has not resolved, we can extend the antibiotic course.  We will check your urine today to make sure the hemoglobin in it has improved.  We will see you back in the clinic in 1 week for your Unna boot. Please also follow up with Dr. Jimmye Norman later this month on either the 23rd or 30th.

## 2022-02-20 NOTE — Progress Notes (Signed)
[employer] to RLE on 02/20/22
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
## 2022-02-21 ENCOUNTER — Encounter: Payer: Self-pay | Admitting: Internal Medicine

## 2022-02-21 LAB — URINALYSIS, ROUTINE W REFLEX MICROSCOPIC
Bilirubin, UA: NEGATIVE
Leukocytes,UA: NEGATIVE
Nitrite, UA: NEGATIVE
RBC, UA: NEGATIVE
Specific Gravity, UA: 1.018 (ref 1.005–1.030)
Urobilinogen, Ur: 0.2 mg/dL (ref 0.2–1.0)
pH, UA: 5.5 (ref 5.0–7.5)

## 2022-02-21 LAB — MICROSCOPIC EXAMINATION
Bacteria, UA: NONE SEEN
Casts: NONE SEEN /lpf
RBC, Urine: NONE SEEN /hpf (ref 0–2)

## 2022-02-21 NOTE — Assessment & Plan Note (Addendum)
A1c 7.3 checked two weeks ago. Ozempic was increased last time as well. She hs been tolerating this well. No GI side effects. - plan to continue Ozempic

## 2022-02-21 NOTE — Assessment & Plan Note (Signed)
Patient had reported dysuria at her last OV. She was found to have a UTI and was treated with 5 day course of abx. Symptoms have mostly resolved today, but still having slight dysuria. UA shows infection has largely cleared. She has been given Rx for Pyridium 200mg  TID to take for 2 days for symptom control.   Dipstick obtained at previous OV also indicated hemoglobinuria. It was recommended that she have follow up UA to assess for resolution of hemoglobinuria. Although UA was obtained, hgb was noted through urine dipstick which was not obtained. We will discuss with [facility] to see if test can be added on. If it cannot be added at this point, patient is scheduled for return OV in 1 week and hemoglobinuria can be checked at that time with dipstick.

## 2022-02-21 NOTE — Assessment & Plan Note (Signed)
Patient reports slight improvement since boot placement. Swelling has improved, and she has not noticed any draining of fluid. Unfortunately, Unna boots were exchanged before I could inspect her leg. I was able to palpate her leg. She had no pitting edema upon palpation. I inspected her remaining toes which showed no edema. No erythema or breaks in the skin either.  She appears to be tolerating the [employer] well with some improvement. She is scheduled to return to office in 1 week for boot exchange.

## 2022-02-24 ENCOUNTER — Telehealth: Payer: Self-pay | Admitting: Internal Medicine

## 2022-02-24 NOTE — Telephone Encounter (Signed)
Pt returning call about her lab results on 02/20/2022.

## 2022-02-25 ENCOUNTER — Ambulatory Visit: Payer: Medicare Other | Admitting: Behavioral Health

## 2022-02-25 ENCOUNTER — Telehealth: Payer: Self-pay | Admitting: Behavioral Health

## 2022-02-25 NOTE — Telephone Encounter (Signed)
Pt prefers a f:f appt so she can speak in private. Pt is home & has ppl all around her w/no confidentiality ensured. Agreed to r/s Pt to [place], March 13th @ 1:00pm.  Dr. Theodis Shove

## 2022-02-25 NOTE — Progress Notes (Signed)
Internal Medicine Clinic Attending  I saw and evaluated the patient.  I personally confirmed the key portions of the history and exam documented by Dr. Gawaluck and I reviewed pertinent patient test results.  The assessment, diagnosis, and plan were formulated together and I agree with the documentation in the resident's note.  

## 2022-02-26 ENCOUNTER — Encounter: Payer: Self-pay | Admitting: Physical Therapy

## 2022-02-26 ENCOUNTER — Ambulatory Visit: Payer: Medicare Other | Admitting: Physical Therapy

## 2022-02-26 ENCOUNTER — Other Ambulatory Visit: Payer: Self-pay

## 2022-02-26 DIAGNOSIS — R2689 Other abnormalities of gait and mobility: Secondary | ICD-10-CM

## 2022-02-26 DIAGNOSIS — M6281 Muscle weakness (generalized): Secondary | ICD-10-CM

## 2022-02-26 LAB — IFOBT (OCCULT BLOOD): IFOBT: NEGATIVE
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
## 2022-02-27 ENCOUNTER — Ambulatory Visit (INDEPENDENT_AMBULATORY_CARE_PROVIDER_SITE_OTHER): Payer: Medicare Other | Admitting: Internal Medicine

## 2022-02-27 VITALS — BP 138/75 | HR 71 | Temp 98.3°F | Ht 65.0 in | Wt 238.7 lb

## 2022-02-27 DIAGNOSIS — E1151 Type 2 diabetes mellitus with diabetic peripheral angiopathy without gangrene: Secondary | ICD-10-CM | POA: Diagnosis not present

## 2022-02-27 DIAGNOSIS — K118 Other diseases of salivary glands: Secondary | ICD-10-CM

## 2022-02-27 DIAGNOSIS — R300 Dysuria: Secondary | ICD-10-CM

## 2022-02-27 DIAGNOSIS — K116 Mucocele of salivary gland: Secondary | ICD-10-CM | POA: Diagnosis not present

## 2022-02-27 DIAGNOSIS — I5032 Chronic diastolic (congestive) heart failure: Secondary | ICD-10-CM

## 2022-02-27 DIAGNOSIS — I152 Hypertension secondary to endocrine disorders: Secondary | ICD-10-CM

## 2022-02-27 DIAGNOSIS — E1159 Type 2 diabetes mellitus with other circulatory complications: Secondary | ICD-10-CM

## 2022-02-27 DIAGNOSIS — I872 Venous insufficiency (chronic) (peripheral): Secondary | ICD-10-CM

## 2022-02-27 NOTE — Patient Instructions (Signed)
Dear [name],  It was nice to see you again today. Thank you for trusting [place] with your care.  Today we discussed your lower leg swelling, the lump on the side of your face, blood pressure, and pain with urination.   For your leg swelling, we will continue with the Unna boot and plan to see you back in 1 week.   For the lump, I have placed a referral to the ear nose and throat doctors to evaluate you for removal of the lump.  For your blood pressure, we will continue with your current medications.  I am glad that your pain with urination has improved.   Please return in 1 week for new Unna boot.
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
## 2022-02-28 ENCOUNTER — Other Ambulatory Visit: Payer: Self-pay

## 2022-02-28 ENCOUNTER — Encounter: Payer: Self-pay | Admitting: Physical Therapy

## 2022-02-28 ENCOUNTER — Encounter: Payer: Self-pay | Admitting: Internal Medicine

## 2022-02-28 ENCOUNTER — Ambulatory Visit: Payer: Medicare Other | Admitting: Physical Therapy

## 2022-02-28 DIAGNOSIS — M6281 Muscle weakness (generalized): Secondary | ICD-10-CM

## 2022-02-28 DIAGNOSIS — R2689 Other abnormalities of gait and mobility: Secondary | ICD-10-CM | POA: Diagnosis not present

## 2022-02-28 NOTE — Assessment & Plan Note (Signed)
Patient states she does not routinely take her lasix, however weight has been stable and no edema. No other reported symptoms.  No changes to current management.

## 2022-02-28 NOTE — Assessment & Plan Note (Signed)
Patient presenting for [employer]. Reports no drainage. Tolerating boot well.  No drainage noted. Skin intact. No pitting edema. Old suture present at surgical site of 4th toe amputation which the patient removed on her own.  Cracked skin at MTP joint and peeling skin on heel, but no obvious wounds or ulcers. Continue Unna boot weekly exchanges and proper foot care.

## 2022-02-28 NOTE — Assessment & Plan Note (Signed)
Patient reports lump on right side of her face has begun bothering her again. States it has been present for years. 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 She was evaluated by Dr. Lucia Gaskins several years ago for excision, but was not cleared by Dr. [name] due to uncontrolled DM and BP.  Firm, tender nodule with no overlying skin changes. Last a1c 7.2. BP slightly elevated, but within acceptable range for surgery.  Referral placed to ENT for surgical removal.
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
## 2022-02-28 NOTE — Assessment & Plan Note (Signed)
UA collected at previous visit shows resolution of uti as well as hemoglobinuria.  Patient had been reporting dysuria and was given short course of pyridium. She did report change in urine color however this has resolved. No dysuria symptoms today. Resolved.

## 2022-02-28 NOTE — Assessment & Plan Note (Signed)
Reports compliance with regimen, however, she does not routinely take her lasix.  BP initially elevated, but within normal range upon recheck. Will continue current management for now.

## 2022-03-03 ENCOUNTER — Telehealth: Payer: Self-pay | Admitting: *Deleted

## 2022-03-03 ENCOUNTER — Ambulatory Visit: Payer: Medicare Other | Admitting: Behavioral Health

## 2022-03-03 ENCOUNTER — Other Ambulatory Visit: Payer: Self-pay | Admitting: *Deleted

## 2022-03-03 DIAGNOSIS — F331 Major depressive disorder, recurrent, moderate: Secondary | ICD-10-CM

## 2022-03-03 DIAGNOSIS — Z639 Problem related to primary support group, unspecified: Secondary | ICD-10-CM

## 2022-03-03 DIAGNOSIS — F419 Anxiety disorder, unspecified: Secondary | ICD-10-CM

## 2022-03-03 MED ORDER — OXYCODONE-ACETAMINOPHEN 5-325 MG PO TABS: 1.0000 | ORAL_TABLET | Freq: Four times a day (QID) | ORAL | 0 refills | Status: DC | PRN

## 2022-03-03 NOTE — Progress Notes (Signed)
Internal Medicine Clinic Attending ° °Case discussed with Dr. Gawaluck  At the time of the visit.  We reviewed the resident’s history and exam and pertinent patient test results.  I agree with the assessment, diagnosis, and plan of care documented in the resident’s note.  °

## 2022-03-03 NOTE — Telephone Encounter (Signed)
Last UA 10/28/2020 Next visit 03/06/2022.

## 2022-03-03 NOTE — BH Specialist Note (Signed)
Integrated Behavioral Health Follow Up In-Person Visit  MRN: [id number] Name: Jennifer [name]  Number of [place] Clinician visits: 4/6 Session Start time: 1300 pm Session End time: 1330 pm Total time in minutes: 30 min  Types of Service: Individual psychotherapy  Interpretor:No. Interpretor Name and Language: n/a   Subjective: Jennifer [name] is a 61 y.o. female accompanied by  self Patient was referred by Dr. Lenise Herald, MD for mental health wellness & pain mgmt. Patient reports the following symptoms/concerns: Pt is worried for the deterioration of her younger Son Elgin's use of substances. She has seen evidence of it in the home & his attitude has been negative for the past few wks. Pt has her own Hx of crack cocaine use & recognizes the signs. Her older Son Georgina Snell also knows younger Jennifer [name] is having difficulty.  Duration of problem: Pt has worried for the medical & mental health of her Jennifer [name] for yrs. He has Hx of illicit drug use; Severity of problem: moderate  Objective: Mood: Depressed and Affect: Depressed Risk of harm to self or others: No plan to harm self or others  Life Context: Family and Social: Pt lives w/her Sons. Pt does not have social time except when her Family comes around to visit or her Sons take her out for her Rudene Anda a few wks ago School/Work: Pt does not work. She does not attend school.  Self-Care: Pt manages her pain issues, her medications, & her visits to Physicians alone. She uses [facility] transport & plans ahead. Pt does minimal to attend to her own personal self-care needs beyond the basics.  Life Changes: Pt & younger Son have had a tough year since he was in the ICU intubated. He is working [employer], & Pt is unaware of whether his job is going well.   Patient and/or Family's Strengths/Protective Factors: Social and Emotional competence, Concrete supports in place (healthy food, safe environments, etc.), Sense of purpose, and  Caregiver has knowledge of parenting & child development  Goals Addressed: Patient will:  Reduce symptoms of: anxiety, depression, and stress   Increase knowledge and/or ability of: coping skills, healthy habits, and stress reduction   Demonstrate ability to: Increase healthy adjustment to current life circumstances and Improve medication compliance-Pt has run out of her Percocet & her Lyrica & needs the refills by Tue this week  Progress towards Goals: Ongoing  Interventions: Interventions utilized:  Behavioral Activation and Supportive Counseling Standardized Assessments completed:  screeners prn  Patient and/or Family Response: Pt receptive to visit f:f today, but time was cut short. Pt will r/s for f:f w/60 min session.  Patient Centered Plan: Patient is on the following Treatment Plan(s): Pt is trying to care a bit less about her 2 grown Sons so she can focus her care on herself. Assessment: Patient currently experiencing fatigue for the welfare of her younger Son. Pt is not unaware he is using substances again. The likelihood is fairy high he is using cocaine. Pt has little tolerance for this beh since she has her own Hx & stopped on her own.   Patient may benefit from cont'd ck-ins for mental health wellness & suggestions for psychological mgmt of pain.   Plan: Follow up with behavioral health clinician on : 3 wks for 60 min f:f Behavioral recommendations: Pt can offer her younger Son her encouragement for his substance use. Pt may want to keep her pain meds out of his sight. Referral(s): [facility] (In  Clinic) "From scale of 1-10, how likely are you to follow plan?": [place], LMFT

## 2022-03-04 LAB — HEMOGLOBIN, FREE, QUAL, URINE: Hemoglobin, Free, Qual, Urine: NEGATIVE

## 2022-03-04 LAB — SPECIMEN STATUS REPORT

## 2022-03-05 ENCOUNTER — Ambulatory Visit: Payer: Medicare Other

## 2022-03-06 ENCOUNTER — Ambulatory Visit (INDEPENDENT_AMBULATORY_CARE_PROVIDER_SITE_OTHER): Payer: Medicare Other | Admitting: Internal Medicine

## 2022-03-06 ENCOUNTER — Encounter: Payer: Self-pay | Admitting: Internal Medicine

## 2022-03-06 VITALS — BP 148/74 | HR 81 | Temp 98.1°F | Ht 65.0 in | Wt 239.2 lb

## 2022-03-06 DIAGNOSIS — Z794 Long term (current) use of insulin: Secondary | ICD-10-CM

## 2022-03-06 DIAGNOSIS — E1159 Type 2 diabetes mellitus with other circulatory complications: Secondary | ICD-10-CM

## 2022-03-06 DIAGNOSIS — I872 Venous insufficiency (chronic) (peripheral): Secondary | ICD-10-CM | POA: Diagnosis not present

## 2022-03-06 DIAGNOSIS — E1151 Type 2 diabetes mellitus with diabetic peripheral angiopathy without gangrene: Secondary | ICD-10-CM

## 2022-03-06 DIAGNOSIS — R2689 Other abnormalities of gait and mobility: Secondary | ICD-10-CM | POA: Diagnosis not present

## 2022-03-06 DIAGNOSIS — E118 Type 2 diabetes mellitus with unspecified complications: Secondary | ICD-10-CM

## 2022-03-06 DIAGNOSIS — I152 Hypertension secondary to endocrine disorders: Secondary | ICD-10-CM

## 2022-03-06 DIAGNOSIS — Z0000 Encounter for general adult medical examination without abnormal findings: Secondary | ICD-10-CM

## 2022-03-06 MED ORDER — FREESTYLE LIBRE 3 SENSOR MISC
1.0000 | Freq: Every day | 2 refills | Status: DC
Start: 2022-03-06 — End: 2023-03-31

## 2022-03-06 NOTE — Patient Instructions (Addendum)
Dear Mrs. Hege,  It was great to see you again today.   Today we changed your Unna boot, discussed your blood pressure, diabetes, and the parotid cyst.   We will see you back in 1 week for an unna boot exchange. Please make sure you take your blood pressure medications. For your diabetes, we have ordered the continuous sensor.   For the lump on the side of your face, please call: [facility] [place] [facility] [address] [place]. [address], [place] [address] [phone]

## 2022-03-06 NOTE — Progress Notes (Signed)
Applied unna boot to RLE
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
## 2022-03-07 ENCOUNTER — Ambulatory Visit: Payer: Medicare Other

## 2022-03-07 ENCOUNTER — Other Ambulatory Visit: Payer: Self-pay

## 2022-03-07 DIAGNOSIS — R296 Repeated falls: Secondary | ICD-10-CM

## 2022-03-07 DIAGNOSIS — M545 Low back pain, unspecified: Secondary | ICD-10-CM

## 2022-03-07 DIAGNOSIS — R2689 Other abnormalities of gait and mobility: Secondary | ICD-10-CM

## 2022-03-07 DIAGNOSIS — M6281 Muscle weakness (generalized): Secondary | ICD-10-CM

## 2022-03-07 DIAGNOSIS — G8929 Other chronic pain: Secondary | ICD-10-CM

## 2022-03-07 DIAGNOSIS — Z89512 Acquired absence of left leg below knee: Secondary | ICD-10-CM
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
## 2022-03-08 ENCOUNTER — Encounter: Payer: Self-pay | Admitting: Internal Medicine

## 2022-03-08 NOTE — Assessment & Plan Note (Signed)
Patient with elevated BP today. Asymptomatic. States she did not take her meds this AM. Vitals:   03/06/22 1315 03/06/22 1437  BP: (!) 160/89 (!) 148/74   No changes to current management, advised she take meds as prescribed.

## 2022-03-08 NOTE — Assessment & Plan Note (Signed)
Patient due for lipid panel, shingrix shot. Defers lipid check for follow up. She will need to get shingrix and tetanus shot through pharmacy.

## 2022-03-08 NOTE — Assessment & Plan Note (Signed)
Average cbg since last visit 166. Elevated readings in 200's past several days. She reports this is related to dietary indiscretion while celebrating her birthday.  Continuous glucose sensor ordered for patient. No changes to current regimen at this time. Advised return to dietary discretion.

## 2022-03-08 NOTE — Assessment & Plan Note (Addendum)
Patient preseninting for weekly Unna boot exchange.  States she is tolerating boot well.  No pitting edema or wounds noted today. No skin breakdown. She has not required lasix and weight has been stable. Will continue boot today, reassess for necessity at follow up in 1 week.

## 2022-03-10 NOTE — Progress Notes (Addendum)
Internal Medicine Clinic Attending  Case discussed with Dr. Elliot Gurney  At the time of the visit.  We reviewed the resident's history and exam and pertinent patient test results.  I agree with the assessment, diagnosis, and plan of care documented in the resident's note.  Vitals:   03/06/22 1315 03/06/22 1437  BP: (!) 160/89 (!) 148/74  Pulse: 80 81  Temp: 98.1 F (36.7 C)   SpO2: 98%

## 2022-03-12 ENCOUNTER — Encounter: Payer: Self-pay | Admitting: Physical Therapy

## 2022-03-12 ENCOUNTER — Ambulatory Visit: Payer: Medicare Other | Admitting: Physical Therapy

## 2022-03-12 ENCOUNTER — Other Ambulatory Visit: Payer: Self-pay

## 2022-03-12 DIAGNOSIS — R2689 Other abnormalities of gait and mobility: Secondary | ICD-10-CM | POA: Diagnosis not present

## 2022-03-12 DIAGNOSIS — M6281 Muscle weakness (generalized): Secondary | ICD-10-CM
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
## 2022-03-13 ENCOUNTER — Ambulatory Visit (INDEPENDENT_AMBULATORY_CARE_PROVIDER_SITE_OTHER): Payer: Medicare Other | Admitting: Internal Medicine

## 2022-03-13 ENCOUNTER — Encounter: Payer: Self-pay | Admitting: Internal Medicine

## 2022-03-13 DIAGNOSIS — I872 Venous insufficiency (chronic) (peripheral): Secondary | ICD-10-CM | POA: Diagnosis not present

## 2022-03-13 DIAGNOSIS — E1159 Type 2 diabetes mellitus with other circulatory complications: Secondary | ICD-10-CM | POA: Diagnosis not present

## 2022-03-13 DIAGNOSIS — R269 Unspecified abnormalities of gait and mobility: Secondary | ICD-10-CM

## 2022-03-13 DIAGNOSIS — E1151 Type 2 diabetes mellitus with diabetic peripheral angiopathy without gangrene: Secondary | ICD-10-CM | POA: Diagnosis not present

## 2022-03-13 DIAGNOSIS — K118 Other diseases of salivary glands: Secondary | ICD-10-CM

## 2022-03-13 DIAGNOSIS — Z0000 Encounter for general adult medical examination without abnormal findings: Secondary | ICD-10-CM

## 2022-03-13 DIAGNOSIS — E118 Type 2 diabetes mellitus with unspecified complications: Secondary | ICD-10-CM

## 2022-03-13 DIAGNOSIS — Z794 Long term (current) use of insulin: Secondary | ICD-10-CM

## 2022-03-13 DIAGNOSIS — I152 Hypertension secondary to endocrine disorders: Secondary | ICD-10-CM
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
## 2022-03-13 NOTE — Patient Instructions (Addendum)
Dear [name],  We discussed your blood pressure, diabetes, leg, and nodule on your face.   Your blood pressure and diabetes are doing well. Keep up the great work!  We will see you back in 1 week to re-evaluate your leg.   Please continue working with the physical therapist.   For the nodule on your face, please call: [facility] [place] [facility] [place] [facility] [employer] [address] [place]. [address] [place] [address] [phone]

## 2022-03-14 ENCOUNTER — Other Ambulatory Visit: Payer: Self-pay

## 2022-03-14 ENCOUNTER — Ambulatory Visit: Payer: Medicare Other | Admitting: Physical Therapy

## 2022-03-14 ENCOUNTER — Encounter: Payer: Self-pay | Admitting: Physical Therapy

## 2022-03-14 DIAGNOSIS — R2689 Other abnormalities of gait and mobility: Secondary | ICD-10-CM | POA: Diagnosis not present

## 2022-03-14 DIAGNOSIS — M6281 Muscle weakness (generalized): Secondary | ICD-10-CM
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
## 2022-03-16 ENCOUNTER — Encounter: Payer: Self-pay | Admitting: Internal Medicine

## 2022-03-16 NOTE — Assessment & Plan Note (Signed)
Has been limiting salt intake. Compliant with medications. Does not take lasix due to urinary frequency.  BP controlled today.  Continue current regimen.

## 2022-03-16 NOTE — Assessment & Plan Note (Signed)
Patient has not yet followed up with ENT. She is requesting their contact information again. Provided her with information and encouraged her to call  Their office.

## 2022-03-16 NOTE — Assessment & Plan Note (Signed)
Presenting for unna boot exchange.  Trace pitting edema noted. No skin breakdown. Will continue for now. Reassess need at next visit.

## 2022-03-16 NOTE — Assessment & Plan Note (Addendum)
Reports blood sugar readings have returned to her baseline. No longer endorsing dietary indiscretion. Has CGM on and readings within normal range.  Asymptomatic.  Diabetes controlled with current regimen, will continue this for now.

## 2022-03-16 NOTE — Assessment & Plan Note (Signed)
Patient deferring lipid screening today. May benefit from colonoscopy, however, will defer this conversation to PCP.

## 2022-03-16 NOTE — Assessment & Plan Note (Signed)
Patient reports motorized wheel chair has been delivered and ramp will be installed soon.  She has been working with PT for gait and balance.  Encouraged her to follow up with PT.

## 2022-03-17 NOTE — Progress Notes (Signed)
Internal Medicine Clinic Attending ° °Case discussed with Dr. Gawaluck  At the time of the visit.  We reviewed the resident’s history and exam and pertinent patient test results.  I agree with the assessment, diagnosis, and plan of care documented in the resident’s note.  °

## 2022-03-18 ENCOUNTER — Telehealth: Payer: Self-pay | Admitting: Dietician

## 2022-03-18 ENCOUNTER — Telehealth: Payer: Self-pay

## 2022-03-18 NOTE — Telephone Encounter (Signed)
Wants to be called soon. Pt discussing a medication not being covered anymore.

## 2022-03-18 NOTE — Telephone Encounter (Signed)
PA for pt ( [place] )  came through on cover my meds was submitted with office notes and labs .. awaiting approval or denial

## 2022-03-18 NOTE — Telephone Encounter (Signed)
Pt reports discussing with company that delivers her [facility] and when she requested more, they told her they need orders first. She has 5, which will last her 2.5 months. She is inquiring about making an appointment with Dr. Jimmye Norman for May.

## 2022-03-19 ENCOUNTER — Telehealth: Payer: Self-pay

## 2022-03-19 ENCOUNTER — Other Ambulatory Visit: Payer: Self-pay

## 2022-03-19 ENCOUNTER — Ambulatory Visit: Payer: Medicare Other

## 2022-03-19 DIAGNOSIS — R2689 Other abnormalities of gait and mobility: Secondary | ICD-10-CM | POA: Diagnosis not present

## 2022-03-19 DIAGNOSIS — M6281 Muscle weakness (generalized): Secondary | ICD-10-CM

## 2022-03-19 DIAGNOSIS — Z89512 Acquired absence of left leg below knee: Secondary | ICD-10-CM

## 2022-03-19 NOTE — Telephone Encounter (Signed)
DECISION :    Message from Plan   This medication or product is on your plan's list of covered drugs.   Prior authorization is not required at this time.     ( COPY SENT TO THE PHARMACY ALSO )
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
## 2022-03-19 NOTE — Telephone Encounter (Signed)
Pa for pt ( MY[employer]ET[employer] TAB ) came through on cover my meds was submitted with office notes from 3/2 .Marland Kitchen awaiting  approval or denial

## 2022-03-20 ENCOUNTER — Ambulatory Visit: Payer: Medicare Other | Admitting: Behavioral Health

## 2022-03-20 ENCOUNTER — Encounter: Payer: Medicare Other | Admitting: Internal Medicine

## 2022-03-20 NOTE — Telephone Encounter (Signed)
DECISION :    N/A on March 29   This medication or product is on your plan's list of covered drugs.   Prior authorization is not required at this time. If your pharmacy has questions regarding the processing of your prescription,    please have them call the [facility] pharmacy help desk at [phone].         (COPY SENT TO PHARMACY ALSO )

## 2022-03-21 ENCOUNTER — Telehealth: Payer: Self-pay

## 2022-03-21 ENCOUNTER — Ambulatory Visit: Payer: Medicare Other

## 2022-03-21 NOTE — Telephone Encounter (Signed)
Pa for pt ( OZ[employer]PIC ) came through on cover my meds was submitted ...    DECISION :    Message from Plan This medication or product is on your plan's list of covered drugs. Prior authorization is not required at this time. If your pharmacy has questions regarding the processing of your prescription, please have them call the [facility] pharmacy help desk at [phone]. *    ( COPY WAS SENT TO PHARMACY ALSO )

## 2022-03-24 ENCOUNTER — Telehealth: Payer: Self-pay

## 2022-03-24 ENCOUNTER — Telehealth: Payer: Self-pay | Admitting: Dietician

## 2022-03-24 NOTE — Telephone Encounter (Signed)
Pa for pt ( [place] ) came through on cover my meds was submitted .. awaiting approval or denial     UPDATE :    [facility] is reviewing your PA request. Typically an electronic response will be received within 24-72 hours.

## 2022-03-24 NOTE — Telephone Encounter (Signed)
Read an article about metformin can causes cancer, blindness, and thicker blood that she says was written by a doctor. Discussed that uncontrolled diabetes can increase a person's risk to those things. She was encouraged to  discuss her concerns  when she is here tomorrow for an appointment.

## 2022-03-25 ENCOUNTER — Ambulatory Visit (INDEPENDENT_AMBULATORY_CARE_PROVIDER_SITE_OTHER): Payer: Medicare Other | Admitting: Internal Medicine

## 2022-03-25 VITALS — BP 136/66 | HR 77 | Temp 98.7°F | Wt 234.6 lb

## 2022-03-25 DIAGNOSIS — Z0000 Encounter for general adult medical examination without abnormal findings: Secondary | ICD-10-CM

## 2022-03-25 DIAGNOSIS — Z23 Encounter for immunization: Secondary | ICD-10-CM

## 2022-03-25 DIAGNOSIS — I152 Hypertension secondary to endocrine disorders: Secondary | ICD-10-CM

## 2022-03-25 DIAGNOSIS — K635 Polyp of colon: Secondary | ICD-10-CM | POA: Diagnosis not present

## 2022-03-25 DIAGNOSIS — Z794 Long term (current) use of insulin: Secondary | ICD-10-CM | POA: Diagnosis not present

## 2022-03-25 DIAGNOSIS — E1169 Type 2 diabetes mellitus with other specified complication: Secondary | ICD-10-CM

## 2022-03-25 DIAGNOSIS — K118 Other diseases of salivary glands: Secondary | ICD-10-CM

## 2022-03-25 DIAGNOSIS — Z89612 Acquired absence of left leg above knee: Secondary | ICD-10-CM | POA: Diagnosis not present

## 2022-03-25 DIAGNOSIS — E1159 Type 2 diabetes mellitus with other circulatory complications: Secondary | ICD-10-CM

## 2022-03-25 DIAGNOSIS — E118 Type 2 diabetes mellitus with unspecified complications: Secondary | ICD-10-CM

## 2022-03-25 DIAGNOSIS — Z720 Tobacco use: Secondary | ICD-10-CM | POA: Diagnosis not present

## 2022-03-25 DIAGNOSIS — E785 Hyperlipidemia, unspecified: Secondary | ICD-10-CM

## 2022-03-25 NOTE — Telephone Encounter (Signed)
DECISION :     Outcome  N/Aon April 3  This medication or product is on your plan's list of covered drugs.   Prior authorization is not required at this time. If your pharmacy has questions regarding the processing of your prescription, please have them call the [facility] pharmacy help desk at [phone]      ( COPY SENT TO PHARMACY ALSO )

## 2022-03-25 NOTE — Assessment & Plan Note (Signed)
Referred for repeat colonoscopy 03/25/22

## 2022-03-25 NOTE — Assessment & Plan Note (Signed)
TDAP today

## 2022-03-25 NOTE — Addendum Note (Signed)
Addended by: Marcelino Duster on: 03/25/2022 04:53 PM   Modules accepted: Orders

## 2022-03-25 NOTE — Patient Instructions (Addendum)
Jennifer [name]  It was a pleasure seeing you in the clinic today.   We talked about your blood pressure, your tetanus shot, your colonoscopy, your cholesterol levels, and your diabetes.  Leg swelling- we want to see you back in clinic in about a week to check your leg swelling.   Please call our clinic at [phone] if you have any questions or concerns. The best time to call is Monday-Friday from 9am-4pm, but there is someone available 24/7 at the same number. If you need medication refills, please notify your pharmacy one week in advance and they will send [place] a request.   Thank you for letting us take part in your care. We look forward to seeing you next time!

## 2022-03-25 NOTE — Assessment & Plan Note (Addendum)
Recheck lipid panel today  Addendum: -lipid panel at goal, continue crestor

## 2022-03-25 NOTE — Assessment & Plan Note (Signed)
Patient compliant on insulin and metformin. - continue current management

## 2022-03-25 NOTE — Assessment & Plan Note (Signed)
Order for DME supplies placed
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
## 2022-03-25 NOTE — Assessment & Plan Note (Signed)
Patient is taking her amlodipine but not her HCTZ because she didn't like her urinary frequency. She is not willing to try any new/different meds at this time. BP reasonably controlled at <140, however would ideally be <130 - continue amlodipine 10

## 2022-03-25 NOTE — Assessment & Plan Note (Signed)
Precontemplative, not interested in quitting today. Brief counseling on health risks provided.

## 2022-03-26 ENCOUNTER — Ambulatory Visit: Payer: Medicare Other | Attending: Internal Medicine

## 2022-03-26 DIAGNOSIS — M6281 Muscle weakness (generalized): Secondary | ICD-10-CM | POA: Diagnosis not present

## 2022-03-26 DIAGNOSIS — Z89512 Acquired absence of left leg below knee: Secondary | ICD-10-CM | POA: Diagnosis not present

## 2022-03-26 DIAGNOSIS — R2689 Other abnormalities of gait and mobility: Secondary | ICD-10-CM | POA: Diagnosis not present

## 2022-03-26 LAB — LIPID PANEL
Chol/HDL Ratio: 3.2 ratio (ref 0.0–4.4)
Cholesterol, Total: 123 mg/dL (ref 100–199)
HDL: 38 mg/dL — ABNORMAL LOW (ref >39)
LDL Chol Calc (NIH): 64 mg/dL (ref 0–99)
Triglycerides: 118 mg/dL (ref 0–149)
VLDL Cholesterol Cal: 21 mg/dL (ref 5–40)
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
## 2022-03-28 ENCOUNTER — Ambulatory Visit: Payer: Medicare Other | Admitting: Physical Therapy

## 2022-03-28 ENCOUNTER — Encounter: Payer: Self-pay | Admitting: Physical Therapy

## 2022-03-28 DIAGNOSIS — R2689 Other abnormalities of gait and mobility: Secondary | ICD-10-CM

## 2022-03-28 DIAGNOSIS — M6281 Muscle weakness (generalized): Secondary | ICD-10-CM

## 2022-03-28 DIAGNOSIS — Z89512 Acquired absence of left leg below knee: Secondary | ICD-10-CM | POA: Diagnosis not present
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
## 2022-04-01 ENCOUNTER — Telehealth: Payer: Self-pay

## 2022-04-01 ENCOUNTER — Ambulatory Visit (INDEPENDENT_AMBULATORY_CARE_PROVIDER_SITE_OTHER): Payer: Medicare Other | Admitting: Internal Medicine

## 2022-04-01 ENCOUNTER — Other Ambulatory Visit: Payer: Self-pay

## 2022-04-01 ENCOUNTER — Encounter: Payer: Self-pay | Admitting: Internal Medicine

## 2022-04-01 DIAGNOSIS — E118 Type 2 diabetes mellitus with unspecified complications: Secondary | ICD-10-CM

## 2022-04-01 DIAGNOSIS — E1151 Type 2 diabetes mellitus with diabetic peripheral angiopathy without gangrene: Secondary | ICD-10-CM | POA: Diagnosis not present

## 2022-04-01 DIAGNOSIS — I872 Venous insufficiency (chronic) (peripheral): Secondary | ICD-10-CM

## 2022-04-01 DIAGNOSIS — Z794 Long term (current) use of insulin: Secondary | ICD-10-CM

## 2022-04-01 DIAGNOSIS — F1120 Opioid dependence, uncomplicated: Secondary | ICD-10-CM

## 2022-04-01 MED ORDER — OXYCODONE-ACETAMINOPHEN 5-325 MG PO TABS: 1.0000 | ORAL_TABLET | Freq: Four times a day (QID) | ORAL | 0 refills | Status: DC | PRN

## 2022-04-01 NOTE — Assessment & Plan Note (Signed)
Patient due for refill 4/12 on her chronic opioid prescription. Patient says that she takes them mostly as written every 6 hours but sometimes will take more pills when it rains. However does not taking more than one pill at once. Discussed that taking extra doses could be dangerous and that patient will not get her prescription refilled earlier than 30 days. She only has 1 pill left today before her refill tomorrow but says that she will be ok because it is not raining. - refill percocet starting 4/12

## 2022-04-01 NOTE — Assessment & Plan Note (Signed)
Average glucose on meter check today 134, 87 % in target range. - continue current management
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
## 2022-04-01 NOTE — Patient Instructions (Signed)
Jennifer [name]  It was a pleasure seeing you in the clinic today.   We talked about your blood pressure, your leg swelling and I refilled your oxycodone. You refill will be available at the pharmacy tomorrow 4/12. Do not take more doses of the medication than you are prescribed.  We will see you back in clinic in about 3 weeks.  Please call our clinic at [phone] if you have any questions or concerns. The best time to call is Monday-Friday from 9am-4pm, but there is someone available 24/7 at the same number. If you need medication refills, please notify your pharmacy one week in advance and they will send [place] a request.   Thank you for letting us take part in your care. We look forward to seeing you next time!

## 2022-04-01 NOTE — Progress Notes (Signed)
Internal Medicine Clinic Attending  I saw and evaluated the patient.  I personally confirmed the key portions of the history and exam documented by Dr. DeMaio   and I reviewed pertinent patient test results.  The assessment, diagnosis, and plan were formulated together and I agree with the documentation in the resident's note.  

## 2022-04-01 NOTE — Assessment & Plan Note (Signed)
Swelling at baseline today, no worse than when unna boot was removed a week ago. Will continue with unna boot off and keep close follow up. No skin breakdown noted though there is difficulty removing her socks which she put on under her leggings which cannot be fully pulled up above her calves. No pain or drainage noted on stockings - f/u in 3 weeks.

## 2022-04-02 ENCOUNTER — Other Ambulatory Visit: Payer: Self-pay | Admitting: Internal Medicine

## 2022-04-02 DIAGNOSIS — G8929 Other chronic pain: Secondary | ICD-10-CM

## 2022-04-02 MED ORDER — OXYCODONE HCL 5 MG PO TABS: 5.0000 mg | ORAL_TABLET | Freq: Four times a day (QID) | ORAL | 0 refills | Status: DC | PRN

## 2022-04-02 NOTE — Telephone Encounter (Signed)
Pt requesting a call back.  Pt was seen on yesterday and states a Prescription for was given to her.  Per the patient her her states the medication below is on back order x 1 month.  Pt states she was to notify her PCP to see if they could send a different medication without the "tylenol" in it    oxyCODONE-acetaminophen (PERCOCET/ROXICET) 5-325 MG tablet

## 2022-04-02 NOTE — Telephone Encounter (Signed)
Called pt - stated Oxycodone 5/325 mg is not available but may have a different strength. I informed pt [facility] has it but pt stated no transportation. And her son does not get off work until later and they will be closed.  I called [facility] - confirmed Oxycodone 5/325 mg is on backorder;unsure when they will be available. But they do have Oxycodone 10/325 mg also Oxy IR 5 mg.

## 2022-04-02 NOTE — Telephone Encounter (Signed)
Pt called / informed of rx for Oxycodone 5 mg (w/o acetaminophen) sent to [facility].

## 2022-04-05 DIAGNOSIS — J9611 Chronic respiratory failure with hypoxia: Secondary | ICD-10-CM | POA: Diagnosis not present

## 2022-04-05 DIAGNOSIS — E1159 Type 2 diabetes mellitus with other circulatory complications: Secondary | ICD-10-CM | POA: Diagnosis not present

## 2022-04-05 DIAGNOSIS — Z89512 Acquired absence of left leg below knee: Secondary | ICD-10-CM | POA: Diagnosis not present

## 2022-04-05 DIAGNOSIS — J441 Chronic obstructive pulmonary disease with (acute) exacerbation: Secondary | ICD-10-CM | POA: Diagnosis not present

## 2022-04-05 DIAGNOSIS — J449 Chronic obstructive pulmonary disease, unspecified: Secondary | ICD-10-CM | POA: Diagnosis not present

## 2022-04-08 ENCOUNTER — Ambulatory Visit: Payer: Medicare Other | Admitting: Podiatry

## 2022-04-08 DIAGNOSIS — D110 Benign neoplasm of parotid gland: Secondary | ICD-10-CM | POA: Diagnosis present

## 2022-04-08 DIAGNOSIS — F17210 Nicotine dependence, cigarettes, uncomplicated: Secondary | ICD-10-CM | POA: Diagnosis not present

## 2022-04-08 DIAGNOSIS — L988 Other specified disorders of the skin and subcutaneous tissue: Secondary | ICD-10-CM | POA: Diagnosis not present

## 2022-04-08 DIAGNOSIS — L720 Epidermal cyst: Secondary | ICD-10-CM | POA: Insufficient documentation

## 2022-04-08 DIAGNOSIS — J343 Hypertrophy of nasal turbinates: Secondary | ICD-10-CM | POA: Diagnosis not present

## 2022-04-08 DIAGNOSIS — K118 Other diseases of salivary glands: Secondary | ICD-10-CM | POA: Diagnosis not present

## 2022-04-09 ENCOUNTER — Ambulatory Visit: Payer: Medicare Other

## 2022-04-09 NOTE — Progress Notes (Signed)
Internal Medicine Clinic Attending ° °Case discussed with Dr. DeMaio  At the time of the visit.  We reviewed the resident’s history and exam and pertinent patient test results.  I agree with the assessment, diagnosis, and plan of care documented in the resident’s note. ° ° °

## 2022-04-11 ENCOUNTER — Ambulatory Visit: Payer: Medicare Other

## 2022-04-16 ENCOUNTER — Ambulatory Visit: Payer: Medicare Other

## 2022-04-16 ENCOUNTER — Telehealth: Payer: Self-pay | Admitting: Dietician

## 2022-04-16 NOTE — Telephone Encounter (Signed)
Accidentally Erased app for Continuous glucose monitoring off her phone. Needs help reinstalling it.

## 2022-04-18 ENCOUNTER — Ambulatory Visit: Payer: Medicare Other | Admitting: Physical Therapy

## 2022-04-18 ENCOUNTER — Other Ambulatory Visit: Payer: Self-pay

## 2022-04-18 ENCOUNTER — Encounter: Payer: Self-pay | Admitting: Physical Therapy

## 2022-04-18 DIAGNOSIS — M6281 Muscle weakness (generalized): Secondary | ICD-10-CM

## 2022-04-18 DIAGNOSIS — R2689 Other abnormalities of gait and mobility: Secondary | ICD-10-CM

## 2022-04-18 DIAGNOSIS — Z89512 Acquired absence of left leg below knee: Secondary | ICD-10-CM | POA: Diagnosis not present

## 2022-04-18 DIAGNOSIS — M79644 Pain in right finger(s): Secondary | ICD-10-CM
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
## 2022-04-22 ENCOUNTER — Other Ambulatory Visit: Payer: Self-pay | Admitting: Otolaryngology

## 2022-04-23 ENCOUNTER — Ambulatory Visit: Payer: Medicare Other | Attending: Internal Medicine | Admitting: Physical Therapy

## 2022-04-23 ENCOUNTER — Encounter: Payer: Self-pay | Admitting: Physical Therapy

## 2022-04-23 DIAGNOSIS — Z89512 Acquired absence of left leg below knee: Secondary | ICD-10-CM | POA: Diagnosis not present

## 2022-04-23 DIAGNOSIS — R2689 Other abnormalities of gait and mobility: Secondary | ICD-10-CM | POA: Diagnosis not present

## 2022-04-23 DIAGNOSIS — M6281 Muscle weakness (generalized): Secondary | ICD-10-CM | POA: Diagnosis not present
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
## 2022-04-24 ENCOUNTER — Ambulatory Visit (INDEPENDENT_AMBULATORY_CARE_PROVIDER_SITE_OTHER): Payer: Medicare Other | Admitting: Internal Medicine

## 2022-04-24 ENCOUNTER — Encounter: Payer: Self-pay | Admitting: Internal Medicine

## 2022-04-24 VITALS — BP 132/78 | HR 77 | Temp 97.8°F | Wt 235.2 lb

## 2022-04-24 DIAGNOSIS — G8929 Other chronic pain: Secondary | ICD-10-CM

## 2022-04-24 DIAGNOSIS — E1151 Type 2 diabetes mellitus with diabetic peripheral angiopathy without gangrene: Secondary | ICD-10-CM | POA: Diagnosis not present

## 2022-04-24 DIAGNOSIS — M62838 Other muscle spasm: Secondary | ICD-10-CM

## 2022-04-24 DIAGNOSIS — E1142 Type 2 diabetes mellitus with diabetic polyneuropathy: Secondary | ICD-10-CM | POA: Diagnosis not present

## 2022-04-24 DIAGNOSIS — I152 Hypertension secondary to endocrine disorders: Secondary | ICD-10-CM

## 2022-04-24 DIAGNOSIS — Z794 Long term (current) use of insulin: Secondary | ICD-10-CM

## 2022-04-24 DIAGNOSIS — K118 Other diseases of salivary glands: Secondary | ICD-10-CM

## 2022-04-24 DIAGNOSIS — F17210 Nicotine dependence, cigarettes, uncomplicated: Secondary | ICD-10-CM

## 2022-04-24 DIAGNOSIS — I872 Venous insufficiency (chronic) (peripheral): Secondary | ICD-10-CM | POA: Diagnosis not present

## 2022-04-24 DIAGNOSIS — E1159 Type 2 diabetes mellitus with other circulatory complications: Secondary | ICD-10-CM | POA: Diagnosis not present

## 2022-04-24 DIAGNOSIS — F1120 Opioid dependence, uncomplicated: Secondary | ICD-10-CM

## 2022-04-24 DIAGNOSIS — Z9889 Other specified postprocedural states: Secondary | ICD-10-CM

## 2022-04-24 DIAGNOSIS — R251 Tremor, unspecified: Secondary | ICD-10-CM | POA: Diagnosis not present

## 2022-04-24 DIAGNOSIS — M5440 Lumbago with sciatica, unspecified side: Secondary | ICD-10-CM | POA: Diagnosis not present

## 2022-04-24 DIAGNOSIS — Z720 Tobacco use: Secondary | ICD-10-CM

## 2022-04-24 DIAGNOSIS — Z1231 Encounter for screening mammogram for malignant neoplasm of breast: Secondary | ICD-10-CM

## 2022-04-24 DIAGNOSIS — I739 Peripheral vascular disease, unspecified: Secondary | ICD-10-CM

## 2022-04-24 DIAGNOSIS — N3946 Mixed incontinence: Secondary | ICD-10-CM

## 2022-04-24 DIAGNOSIS — I5032 Chronic diastolic (congestive) heart failure: Secondary | ICD-10-CM

## 2022-04-24 DIAGNOSIS — Z89612 Acquired absence of left leg above knee: Secondary | ICD-10-CM

## 2022-04-24 DIAGNOSIS — E118 Type 2 diabetes mellitus with unspecified complications: Secondary | ICD-10-CM

## 2022-04-24 MED ORDER — DULOXETINE HCL 30 MG PO CPEP: 30.0000 mg | ORAL_CAPSULE | Freq: Two times a day (BID) | ORAL | 3 refills | Status: DC

## 2022-04-24 MED ORDER — BACLOFEN 10 MG PO TABS: 5.0000 mg | ORAL_TABLET | Freq: Two times a day (BID) | ORAL | 0 refills | Status: DC

## 2022-04-24 NOTE — Patient Instructions (Signed)
Jennifer [name],  Let's get together in another two weeks for your GYN exam.  We'll also talk about your response to the new medicines:  duloxetine for your neuropathy pain, and the baclofen for your spasms.  Be careful about sedation!  Don't try to walk if you feel sleepy or unsteady.  YOU WILL FALL.  I will refill your oxycodones for the next 3 months (it will still be one month at a time).  I will also order your new L leg socket at [facility].    See you soon,  Dr. Jimmye Norman
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
## 2022-04-25 ENCOUNTER — Ambulatory Visit: Payer: Medicare Other

## 2022-04-25 DIAGNOSIS — M6281 Muscle weakness (generalized): Secondary | ICD-10-CM

## 2022-04-25 DIAGNOSIS — Z89512 Acquired absence of left leg below knee: Secondary | ICD-10-CM

## 2022-04-25 DIAGNOSIS — R2689 Other abnormalities of gait and mobility: Secondary | ICD-10-CM | POA: Diagnosis not present

## 2022-04-25 MED ORDER — TRAZODONE HCL 100 MG PO TABS: 200.0000 mg | ORAL_TABLET | Freq: Every evening | ORAL | 11 refills | Status: DC | PRN

## 2022-04-25 MED ORDER — DICLOFENAC SODIUM 1 % EX GEL: 2.0000 g | Freq: Four times a day (QID) | CUTANEOUS | 1 refills | Status: DC

## 2022-04-25 MED ORDER — METFORMIN HCL 500 MG PO TABS: 500.0000 mg | ORAL_TABLET | Freq: Two times a day (BID) | ORAL | 3 refills | Status: DC
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
## 2022-04-29 MED ORDER — OXYCODONE HCL 5 MG PO TABS: 5.0000 mg | ORAL_TABLET | Freq: Four times a day (QID) | ORAL | 0 refills | Status: DC | PRN

## 2022-04-29 NOTE — Assessment & Plan Note (Signed)
SBP 132 today, improved from some prior readings.  Diuretic not tolerated due to UI.

## 2022-04-29 NOTE — Assessment & Plan Note (Addendum)
RLE edema about 3+, softer than in the past, no skin lesions, no impending loss of skin integrity.  Amlodipine 10 mg could be contributing, though this is needed for HTN control.  Skin is mildly erythematous and mildly tender with pressure though there is no concern of cellulitis.  She needs to obtain some knee-high compression stockings which the she thinks she can do through [facility], and will ask the PT tomorrow whether or not she can elevate the legs of her wheelchair.

## 2022-04-29 NOTE — Assessment & Plan Note (Signed)
Chronic RLE edema persists, though this is more dependent edema than exacerbation of heart failure.  She is asymptomatic.  Further diuresis impacted by severe urge-stress-functional URI.

## 2022-04-29 NOTE — Assessment & Plan Note (Signed)
Unchanged, remaining frustrating and negatively impacting quality of life.  Continues on Myrbetriq and Detrol which she feels are ineffective, though she is fearful of worsening symptoms if she was to stop.  Today she is wearing 3 pull-ups.  She must change these absorptive undergarments every time she uses the bathroom.

## 2022-04-29 NOTE — Assessment & Plan Note (Addendum)
Severe and poorly controlled with duloxetine 30 mg twice daily and pregabalin 200 mg 3 times daily.TCAs not desirable due to side effect profile.  Other considerations would be topical capsaicin, alpha lipoic acid of 600 mg daily (antioxidant) valproic acid, and carbamazepine.  Duloxetine could potentially be increased to 60 twice daily which I will discuss with her at next visit.

## 2022-04-29 NOTE — Assessment & Plan Note (Signed)
No rest pain, no ischemic lesions.  Former amputation surgical wound has healed very well.  Continue risk factor management.

## 2022-04-29 NOTE — Assessment & Plan Note (Signed)
No tremor.  Suspect it was multifactorial, in part due to polypharmacy and discomfort.  Monitor.

## 2022-04-29 NOTE — Assessment & Plan Note (Signed)
Pilar Plate discussion today about the importance of cessation.  She acknowledges, and will give this more serious consideration.  Resume conversation next visit.

## 2022-04-29 NOTE — Assessment & Plan Note (Signed)
Adherent to regimen without request for early refills, regimen tolerated well.  She request to continue with the oxycodone alone without Tylenol.  Refill will be prepared for 05/02/2022.

## 2022-04-29 NOTE — Assessment & Plan Note (Signed)
Good control, A1c next visit.  Continue current regimen.

## 2022-04-29 NOTE — Assessment & Plan Note (Signed)
Severe and poorly controlled with duloxetine 30 mg twice daily and pregabalin 200 mg 3 times daily.TCAs not desirable due to side effect profile.  Other considerations would be topical capsaicin, alpha lipoic acid of 600 mg daily (antioxidant) valproic acid, and carbamazepine.  Duloxetine could potentially be increased to 60 twice daily which I will discuss with her at next visit.

## 2022-04-29 NOTE — Assessment & Plan Note (Signed)
Surgical excision is scheduled for the end of the month.  ENT notes reviewed.

## 2022-04-30 ENCOUNTER — Ambulatory Visit: Payer: Medicare Other

## 2022-04-30 ENCOUNTER — Ambulatory Visit (INDEPENDENT_AMBULATORY_CARE_PROVIDER_SITE_OTHER): Payer: Self-pay | Admitting: Podiatry

## 2022-04-30 DIAGNOSIS — Z91199 Patient's noncompliance with other medical treatment and regimen due to unspecified reason: Secondary | ICD-10-CM

## 2022-04-30 DIAGNOSIS — R2689 Other abnormalities of gait and mobility: Secondary | ICD-10-CM | POA: Diagnosis not present

## 2022-04-30 DIAGNOSIS — M6281 Muscle weakness (generalized): Secondary | ICD-10-CM | POA: Diagnosis not present

## 2022-04-30 DIAGNOSIS — R300 Dysuria: Secondary | ICD-10-CM | POA: Diagnosis not present

## 2022-04-30 DIAGNOSIS — Z89512 Acquired absence of left leg below knee: Secondary | ICD-10-CM

## 2022-04-30 NOTE — Progress Notes (Signed)
   Complete physical exam  Patient: Jennifer [name]   DOB: 10/11/2001   61 y.o. Female  MRN: [id number]  Subjective:    No chief complaint on file.   Jennifer [name] is a 61 y.o. female who presents today for a complete physical exam. She reports consuming a {diet types:17450} diet. {types:19826} She generally feels {DESC; WELL/FAIRLY WELL/POORLY:18703}. She reports sleeping {DESC; WELL/FAIRLY WELL/POORLY:18703}. She {does/does not:200015} have additional problems to discuss today.    Most recent fall risk assessment:    06/18/2022   10:42 AM  Fall Risk   Falls in the past year? 0  Number falls in past yr: 0  Injury with Fall? 0  Risk for fall due to : No Fall Risks  Follow up Falls evaluation completed     Most recent depression screenings:    06/18/2022   10:42 AM 05/09/2021   10:46 AM  PHQ 2/9 Scores  PHQ - 2 Score 0 0  PHQ- 9 Score 5     {VISON DENTAL STD PSA (Optional):27386}  {History (Optional):23778}  Patient Care Team: Jessup, Joy, NP as PCP - General (Nurse Practitioner)   Outpatient Medications Prior to Visit  Medication Sig   fluticasone (FLONASE) 50 MCG/ACT nasal spray Place 2 sprays into both nostrils in the morning and at bedtime. After 7 days, reduce to once daily.   norgestimate-ethinyl estradiol (SPRINTEC 28) 0.25-35 MG-MCG tablet Take 1 tablet by mouth daily.   Nystatin POWD Apply liberally to affected area 2 times per day   spironolactone (ALDACTONE) 100 MG tablet Take 1 tablet (100 mg total) by mouth daily.   No facility-administered medications prior to visit.    ROS        Objective:     There were no vitals taken for this visit. {Vitals History (Optional):23777}  Physical Exam   No results found for any visits on 07/24/22. {Show previous labs (optional):23779}    Assessment & Plan:    Routine Health Maintenance and Physical Exam  Immunization History  Administered Date(s) Administered   DTaP 12/25/1999, 02/20/2000,  04/30/2000, 01/14/2001, 07/30/2004   Hepatitis A 05/26/2008, 06/01/2009   Hepatitis B 10/12/1999, 11/19/1999, 04/30/2000   HiB (PRP-OMP) 12/25/1999, 02/20/2000, 04/30/2000, 01/14/2001   IPV 12/25/1999, 02/20/2000, 10/19/2000, 07/30/2004   Influenza,inj,Quad PF,6+ Mos 09/01/2014   Influenza-Unspecified 12/01/2012   MMR 10/19/2001, 07/30/2004   Meningococcal Polysaccharide 05/31/2012   Pneumococcal Conjugate-13 01/14/2001   Pneumococcal-Unspecified 04/30/2000, 07/14/2000   Tdap 05/31/2012   Varicella 10/19/2000, 05/26/2008    Health Maintenance  Topic Date Due   HIV Screening  Never done   Hepatitis C Screening  Never done   INFLUENZA VACCINE  07/22/2022   PAP-Cervical Cytology Screening  07/24/2022 (Originally 10/10/2020)   PAP SMEAR-Modifier  07/24/2022 (Originally 10/10/2020)   TETANUS/TDAP  07/24/2022 (Originally 05/31/2022)   HPV VACCINES  Discontinued   COVID-19 Vaccine  Discontinued    Discussed health benefits of physical activity, and encouraged her to engage in regular exercise appropriate for her age and condition.  Problem List Items Addressed This Visit   None Visit Diagnoses     Annual physical exam    -  Primary   Cervical cancer screening       Need for Tdap vaccination          No follow-ups on file.     Joy Jessup, NP   
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
## 2022-05-01 ENCOUNTER — Telehealth: Payer: Self-pay | Admitting: Dietician

## 2022-05-01 NOTE — Telephone Encounter (Signed)
Thank you :)

## 2022-05-01 NOTE — Telephone Encounter (Signed)
Jennifer [name] left two voicemail messages on May 9 that she is having problems with blood sugar going up and down;afraid to take insulin and metformin because she does not want it to go down anymore, checked it earlier and it was 59 drank tea went to 119, rechecks it a while ago went down to 65 then ate oodles of noodles

## 2022-05-01 NOTE — Telephone Encounter (Signed)
Called pt per Donna's request - no answer; left message on self-identified vm I was calling about her blood sugars and to call the office.

## 2022-05-05 DIAGNOSIS — J9611 Chronic respiratory failure with hypoxia: Secondary | ICD-10-CM | POA: Diagnosis not present

## 2022-05-05 DIAGNOSIS — E1159 Type 2 diabetes mellitus with other circulatory complications: Secondary | ICD-10-CM | POA: Diagnosis not present

## 2022-05-05 DIAGNOSIS — J441 Chronic obstructive pulmonary disease with (acute) exacerbation: Secondary | ICD-10-CM | POA: Diagnosis not present

## 2022-05-05 DIAGNOSIS — J449 Chronic obstructive pulmonary disease, unspecified: Secondary | ICD-10-CM | POA: Diagnosis not present

## 2022-05-05 DIAGNOSIS — Z89512 Acquired absence of left leg below knee: Secondary | ICD-10-CM | POA: Diagnosis not present

## 2022-05-06 NOTE — Progress Notes (Addendum)
Surgical Instructions    Your procedure is scheduled on Wednesday May 24th.  Report to [facility] Main Entrance "A" at 5:30 A.M., then check in with the Admitting office.  Call this number if you have problems the morning of surgery:  [phone]   If you have any questions prior to your surgery date call [phone]: Open Monday-Friday 8am-4pm    Remember:  Do not eat or drink after midnight the night before your surgery      Take these medicines the morning of surgery with A SIP OF WATER: amLODipine (NORVASC) 10 MG tablet baclofen (LIORESAL) 10 MG tablet DULoxetine (CYMBALTA) 30 MG capsule metoprolol succinate (TOPROL-XL) 100 MG 24 hr tablet oxyCODONE (OXY IR/ROXICODONE) 5 MG immediate release tablet pregabalin (LYRICA) 200 MG capsule rosuvastatin (CRESTOR) 20 MG tablet umeclidinium-vilanterol (ANORO ELLIPTA) 62.5-25 MCG/INH AEPB please bring with you to the hospital Vibegron (GEMTESA) 75 MG TABS IF NEEDED acetaminophen (TYLENOL) 650 MG CR tablet albuterol (PROAIR HFA) 108 (90 Base) MCG/ACT inhaler please bring to the hospital diphenhydrAMINE (BENADRYL) 25 MG tablet loperamide (IMODIUM A-D) 2 MG tablet   Follow your surgeon's instructions on when to stop Aspirin.  If no instructions were given by your surgeon then you will need to call the office to get those instructions.    As of today, STOP taking any Aspirin (unless otherwise instructed by your surgeon) Voltaren, Aleve, Naproxen, Ibuprofen, Motrin, Advil, Goody's, BC's, all herbal medications, fish oil, and all vitamins.  WHAT DO I DO ABOUT MY DIABETES MEDICATION?   Do not take oral diabetes medicines (Metformin) the morning of surgery.  THE NIGHT BEFORE SURGERY, take ____50%_______ units of ____Tresiba (35units)_______insulin.       THE MORNING OF SURGERY, take _____50%________ units of __Tresiba  (35 units)________insulin.  The day of surgery, do not take other diabetes injectables, including Byetta  (exenatide), Bydureon (exenatide ER), Victoza (liraglutide), or Trulicity (dulaglutide).  If your CBG is greater than 220 mg/dL, you may take  of your sliding scale (correction) dose of insulin.   HOW TO MANAGE YOUR DIABETES BEFORE AND AFTER SURGERY  Why is it important to control my blood sugar before and after surgery? Improving blood sugar levels before and after surgery helps healing and can limit problems. A way of improving blood sugar control is eating a healthy diet by:  Eating less sugar and carbohydrates  Increasing activity/exercise  Talking with your doctor about reaching your blood sugar goals High blood sugars (greater than 180 mg/dL) can raise your risk of infections and slow your recovery, so you will need to focus on controlling your diabetes during the weeks before surgery. Make sure that the doctor who takes care of your diabetes knows about your planned surgery including the date and location.  How do I manage my blood sugar before surgery? Check your blood sugar at least 4 times a day, starting 2 days before surgery, to make sure that the level is not too high or low.  Check your blood sugar the morning of your surgery when you wake up and every 2 hours until you get to the Short Stay unit.  If your blood sugar is less than 70 mg/dL, you will need to treat for low blood sugar: Do not take insulin. Treat a low blood sugar (less than 70 mg/dL) with  cup of clear juice (cranberry or apple), 4 glucose tablets, OR glucose gel. Recheck blood sugar in 15 minutes after treatment (to make sure it is greater than 70 mg/dL). If your  blood sugar is not greater than 70 mg/dL on recheck, call [phone] for further instructions. Report your blood sugar to the short stay nurse when you get to Short Stay.  If you are admitted to the hospital after surgery: Your blood sugar will be checked by the staff and you will probably be given insulin after surgery (instead of oral diabetes  medicines) to make sure you have good blood sugar levels. The goal for blood sugar control after surgery is 80-180 mg/dL.           Do not wear jewelry or makeup Do not wear lotions, powders, perfumes, or deodorant. Do not shave 48 hours prior to surgery.   Do not bring valuables to the hospital. Do not wear nail polish, gel polish, artificial nails, or any other type of covering on natural nails (fingers and toes) If you have artificial nails or gel coating that need to be removed by a nail salon, please have this removed prior to surgery. Artificial nails or gel coating may interfere with anesthesia's ability to adequately monitor your vital signs.  [facility] is not responsible for any belongings or valuables. .   Do NOT Smoke (Tobacco/Vaping)  24 hours prior to your procedure  If you use a CPAP at night, you may bring your mask for your overnight stay.   Contacts, glasses, hearing aids, dentures or partials may not be worn into surgery, please bring cases for these belongings   For patients admitted to the hospital, discharge time will be determined by your treatment team.   Patients discharged the day of surgery will not be allowed to drive home, and someone needs to stay with them for 24 hours.   SURGICAL WAITING ROOM VISITATION Patients having surgery or a procedure in a hospital may have two support people. Children under the age of 72 must have an adult with them who is not the patient. They may stay in the waiting area during the procedure and may switch out with other visitors. If the patient needs to stay at the hospital during part of their recovery, the visitor guidelines for inpatient rooms apply.  Please refer to the [facility] website for the visitor guidelines for Inpatients (after your surgery is over and you are in a regular room).       Special instructions:    Oral Hygiene is also important to reduce your risk of infection.  Remember - BRUSH YOUR TEETH THE  MORNING OF SURGERY WITH YOUR REGULAR TOOTHPASTE   [facility]- Preparing For Surgery  Before surgery, you can play an important role. Because skin is not sterile, your skin needs to be as free of germs as possible. You can reduce the number of germs on your skin by washing with CHG (chlorahexidine gluconate) Soap before surgery.  CHG is an antiseptic cleaner which kills germs and bonds with the skin to continue killing germs even after washing.     Please do not use if you have an allergy to CHG or antibacterial soaps. If your skin becomes reddened/irritated stop using the CHG.  Do not shave (including legs and underarms) for at least 48 hours prior to first CHG shower. It is OK to shave your face.  Please follow these instructions carefully.     Shower the NIGHT BEFORE SURGERY and the MORNING OF SURGERY with CHG Soap.   If you chose to wash your hair, wash your hair first as usual with your normal shampoo. After you shampoo, rinse your hair  and body thoroughly to remove the shampoo.  Then [employer] and genitals (private parts) with your normal soap and rinse thoroughly to remove soap.  After that Use CHG Soap as you would any other liquid soap. You can apply CHG directly to the skin and wash gently with a scrungie or a clean washcloth.   Apply the CHG Soap to your body ONLY FROM THE NECK DOWN.  Do not use on open wounds or open sores. Avoid contact with your eyes, ears, mouth and genitals (private parts). Wash Face and genitals (private parts)  with your normal soap.   Wash thoroughly, paying special attention to the area where your surgery will be performed.  Thoroughly rinse your body with warm water from the neck down.  DO NOT shower/wash with your normal soap after using and rinsing off the CHG Soap.  Pat yourself dry with a CLEAN TOWEL.  Wear CLEAN PAJAMAS to bed the night before surgery  Place CLEAN SHEETS on your bed the night before your surgery  DO NOT SLEEP WITH  PETS.   Day of Surgery:  Take a shower with CHG soap. Wear Clean/Comfortable clothing the morning of surgery Do not apply any deodorants/lotions.   Remember to brush your teeth WITH YOUR REGULAR TOOTHPASTE.    If you received a COVID test during your pre-op visit, it is requested that you wear a mask when out in public, stay away from anyone that may not be feeling well, and notify your surgeon if you develop symptoms. If you have been in contact with anyone that has tested positive in the last 10 days, please notify your surgeon.    Please read over the following fact sheets that you were given.

## 2022-05-07 ENCOUNTER — Encounter (HOSPITAL_COMMUNITY)
Admission: RE | Admit: 2022-05-07 | Discharge: 2022-05-07 | Disposition: A | Discharge status: Home / Self Care | Payer: Medicare Other | Source: Ambulatory Visit | Attending: Otolaryngology | Admitting: Otolaryngology

## 2022-05-07 ENCOUNTER — Encounter: Payer: Medicare Other | Admitting: Physical Therapy

## 2022-05-07 ENCOUNTER — Encounter (HOSPITAL_COMMUNITY): Payer: Self-pay

## 2022-05-07 ENCOUNTER — Other Ambulatory Visit: Payer: Self-pay

## 2022-05-07 VITALS — BP 118/62 | HR 67 | Temp 97.8°F | Resp 19 | Ht 65.0 in | Wt 233.0 lb

## 2022-05-07 DIAGNOSIS — Z01812 Encounter for preprocedural laboratory examination: Secondary | ICD-10-CM | POA: Insufficient documentation

## 2022-05-07 DIAGNOSIS — I152 Hypertension secondary to endocrine disorders: Secondary | ICD-10-CM | POA: Diagnosis not present

## 2022-05-07 DIAGNOSIS — E1159 Type 2 diabetes mellitus with other circulatory complications: Secondary | ICD-10-CM | POA: Diagnosis not present

## 2022-05-07 HISTORY — DX: Headache, unspecified: R51.9

## 2022-05-07 LAB — CBC
HCT: 41.3 % (ref 36.0–46.0)
Hemoglobin: 13 g/dL (ref 12.0–15.0)
MCH: 29.5 pg (ref 26.0–34.0)
MCHC: 31.5 g/dL (ref 30.0–36.0)
MCV: 93.9 fL (ref 80.0–100.0)
Platelets: 150 10*3/uL (ref 150–400)
RBC: 4.4 MIL/uL (ref 3.87–5.11)
RDW: 14.7 % (ref 11.5–15.5)
WBC: 8.3 10*3/uL (ref 4.0–10.5)
nRBC: 0 % (ref 0.0–0.2)

## 2022-05-07 LAB — BASIC METABOLIC PANEL
Anion gap: 10 (ref 5–15)
BUN: 18 mg/dL (ref 8–23)
CO2: 27 mmol/L (ref 22–32)
Calcium: 9 mg/dL (ref 8.9–10.3)
Chloride: 102 mmol/L (ref 98–111)
Creatinine, Ser: 1.08 mg/dL — ABNORMAL HIGH (ref 0.44–1.00)
GFR, Estimated: 58 mL/min — ABNORMAL LOW (ref >60)
Glucose, Bld: 173 mg/dL — ABNORMAL HIGH (ref 70–99)
Potassium: 3.7 mmol/L (ref 3.5–5.1)
Sodium: 139 mmol/L (ref 135–145)

## 2022-05-07 LAB — HEMOGLOBIN A1C
Hgb A1c MFr Bld: 7.4 % — ABNORMAL HIGH (ref 4.8–5.6)
Mean Plasma Glucose: 165.68 mg/dL

## 2022-05-07 LAB — GLUCOSE, CAPILLARY: Glucose-Capillary: 181 mg/dL — ABNORMAL HIGH (ref 70–99)

## 2022-05-07 NOTE — Progress Notes (Signed)
PCP - Terrial Rhodes MD Cardiologist - Denies  PPM/ICD - Denies Device Orders -  Rep Notified -   Chest x-ray - Not indicated EKG - 10/29/21 Stress Test - 09/19/13 ECHO - 10/17/19 Cardiac Cath - Denies  Sleep Study -Yes no OSA   DM - Type II CBG @ PAT appt 181 Fasting Blood Sugar - 87 Checks Blood Sugar __4___ times a day. Has a libre.   Blood Thinner Instructions:Denies Aspirin Instructions: Requested for you to call Dr. Redmond Baseman for instruction on when to stop.   ERAS Protcol -No  Anesthesia review: No  Patient denies shortness of breath, fever, cough and chest pain at PAT appointment   All instructions explained to the patient, with a verbal understanding of the material. Patient agrees to go over the instructions while at home for a better understanding. P The opportunity to ask questions was provided.

## 2022-05-08 ENCOUNTER — Ambulatory Visit
Admission: RE | Admit: 2022-05-08 | Discharge: 2022-05-08 | Disposition: A | Discharge status: Home / Self Care | Payer: Medicare Other | Source: Ambulatory Visit | Attending: Internal Medicine | Admitting: Internal Medicine

## 2022-05-08 DIAGNOSIS — Z1231 Encounter for screening mammogram for malignant neoplasm of breast: Secondary | ICD-10-CM

## 2022-05-09 NOTE — Telephone Encounter (Signed)
Called and left message for Jennifer [name] to call the office on Monday if still having blood sugar problems or th after hours number this weekend if she feels it is urgent.

## 2022-05-13 ENCOUNTER — Other Ambulatory Visit: Payer: Self-pay

## 2022-05-13 ENCOUNTER — Encounter (HOSPITAL_COMMUNITY): Payer: Self-pay | Admitting: Otolaryngology

## 2022-05-13 MED ORDER — PREGABALIN 200 MG PO CAPS: ORAL_CAPSULE | ORAL | 3 refills | Status: DC

## 2022-05-13 NOTE — Anesthesia Preprocedure Evaluation (Addendum)
Anesthesia Evaluation  Patient identified by MRN, date of birth, ID band Patient awake    Reviewed: Allergy & Precautions, NPO status , Patient's Chart, lab work & pertinent test results, reviewed documented beta blocker date and time   Airway Mallampati: III  TM Distance: >3 FB Neck ROM: Full    Dental  (+) Edentulous Upper, Edentulous Lower   Pulmonary asthma , pneumonia, resolved, COPD,  COPD inhaler, Current Smoker,    Pulmonary exam normal breath sounds clear to auscultation       Cardiovascular hypertension, Pt. on medications + Peripheral Vascular Disease and +CHF  Normal cardiovascular exam Rhythm:Regular Rate:Normal  S/P bilateral common iliac revascularization  EKG 10/28/21 NSR, inferior infarct, anterior infarct  Echo 03/04/21 1. Left ventricular ejection fraction, by estimation, is 60 to 65%. The left ventricle has normal function. The left ventricle has no regional wall motion abnormalities. Left ventricular diastolic parameters are consistent with Grade I diastolic dysfunction (impaired relaxation).  2. Right ventricular systolic function is normal. The right ventricular size is normal.  3. Right atrial size was mildly dilated.  4. The mitral valve is normal in structure. No evidence of mitral valve regurgitation.  5. The aortic valve is normal in structure. Aortic valve regurgitation is not visualized.    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 Scalp  lesion   Abdominal (+) + obese,   Peds  Hematology  (+) Blood dyscrasia, anemia ,   Anesthesia Other Findings   Reproductive/Obstetrics                           Anesthesia Physical Anesthesia Plan  ASA: 3  Anesthesia Plan: General   Post-op Pain Management: Ketamine IV*, Precedex and Dilaudid IV   Induction: Intravenous  PONV Risk Score and Plan: 3 and Treatment may vary due to age or medical condition and Ondansetron  Airway Management Planned: Oral ETT  Additional Equipment: None  Intra-op Plan:   Post-operative Plan: Extubation in OR  Informed Consent: I have reviewed the patients History and Physical, chart, labs and discussed the procedure including the risks, benefits and alternatives for the proposed anesthesia with the patient or authorized representative who has indicated his/her understanding and acceptance.       Plan Discussed with: CRNA and Anesthesiologist  Anesthesia Plan Comments:        Anesthesia Quick Evaluation

## 2022-05-14 ENCOUNTER — Encounter: Payer: Medicare Other | Admitting: Physical Therapy

## 2022-05-14 ENCOUNTER — Other Ambulatory Visit: Payer: Self-pay

## 2022-05-14 ENCOUNTER — Observation Stay (HOSPITAL_COMMUNITY)
Admission: RE | Admit: 2022-05-14 | Discharge: 2022-05-15 | Disposition: A | Discharge status: Home / Self Care | Payer: Medicare Other | Attending: Otolaryngology | Admitting: Otolaryngology

## 2022-05-14 ENCOUNTER — Ambulatory Visit (HOSPITAL_COMMUNITY): Payer: Medicare Other | Admitting: Anesthesiology

## 2022-05-14 ENCOUNTER — Encounter (HOSPITAL_COMMUNITY): Payer: Self-pay | Admitting: Otolaryngology

## 2022-05-14 ENCOUNTER — Ambulatory Visit (HOSPITAL_BASED_OUTPATIENT_CLINIC_OR_DEPARTMENT_OTHER): Payer: Medicare Other | Admitting: Anesthesiology

## 2022-05-14 ENCOUNTER — Encounter (HOSPITAL_COMMUNITY)
Admission: RE | Disposition: A | Discharge status: Home / Self Care | Payer: Self-pay | Source: Home / Self Care | Attending: Otolaryngology

## 2022-05-14 DIAGNOSIS — I509 Heart failure, unspecified: Secondary | ICD-10-CM

## 2022-05-14 DIAGNOSIS — L720 Epidermal cyst: Secondary | ICD-10-CM

## 2022-05-14 DIAGNOSIS — Z86718 Personal history of other venous thrombosis and embolism: Secondary | ICD-10-CM | POA: Diagnosis not present

## 2022-05-14 DIAGNOSIS — I152 Hypertension secondary to endocrine disorders: Secondary | ICD-10-CM

## 2022-05-14 DIAGNOSIS — I5032 Chronic diastolic (congestive) heart failure: Secondary | ICD-10-CM | POA: Diagnosis not present

## 2022-05-14 DIAGNOSIS — N189 Chronic kidney disease, unspecified: Secondary | ICD-10-CM | POA: Diagnosis not present

## 2022-05-14 DIAGNOSIS — Z79899 Other long term (current) drug therapy: Secondary | ICD-10-CM | POA: Insufficient documentation

## 2022-05-14 DIAGNOSIS — L905 Scar conditions and fibrosis of skin: Secondary | ICD-10-CM | POA: Diagnosis not present

## 2022-05-14 DIAGNOSIS — K118 Other diseases of salivary glands: Secondary | ICD-10-CM | POA: Diagnosis present

## 2022-05-14 DIAGNOSIS — J449 Chronic obstructive pulmonary disease, unspecified: Secondary | ICD-10-CM | POA: Diagnosis not present

## 2022-05-14 DIAGNOSIS — D490 Neoplasm of unspecified behavior of digestive system: Secondary | ICD-10-CM | POA: Diagnosis present

## 2022-05-14 DIAGNOSIS — Z7984 Long term (current) use of oral hypoglycemic drugs: Secondary | ICD-10-CM | POA: Insufficient documentation

## 2022-05-14 DIAGNOSIS — D110 Benign neoplasm of parotid gland: Principal | ICD-10-CM | POA: Insufficient documentation

## 2022-05-14 DIAGNOSIS — E1159 Type 2 diabetes mellitus with other circulatory complications: Secondary | ICD-10-CM

## 2022-05-14 DIAGNOSIS — J45909 Unspecified asthma, uncomplicated: Secondary | ICD-10-CM | POA: Diagnosis not present

## 2022-05-14 DIAGNOSIS — I110 Hypertensive heart disease with heart failure: Secondary | ICD-10-CM | POA: Diagnosis not present

## 2022-05-14 DIAGNOSIS — E1122 Type 2 diabetes mellitus with diabetic chronic kidney disease: Secondary | ICD-10-CM | POA: Diagnosis not present

## 2022-05-14 DIAGNOSIS — Z7982 Long term (current) use of aspirin: Secondary | ICD-10-CM | POA: Diagnosis not present

## 2022-05-14 DIAGNOSIS — F17210 Nicotine dependence, cigarettes, uncomplicated: Secondary | ICD-10-CM | POA: Diagnosis not present

## 2022-05-14 DIAGNOSIS — R220 Localized swelling, mass and lump, head: Secondary | ICD-10-CM | POA: Diagnosis present

## 2022-05-14 DIAGNOSIS — L731 Pseudofolliculitis barbae: Secondary | ICD-10-CM | POA: Insufficient documentation

## 2022-05-14 DIAGNOSIS — I130 Hypertensive heart and chronic kidney disease with heart failure and stage 1 through stage 4 chronic kidney disease, or unspecified chronic kidney disease: Secondary | ICD-10-CM | POA: Insufficient documentation

## 2022-05-14 HISTORY — PX: PREAURICULAR CYST EXCISION: SHX2264

## 2022-05-14 HISTORY — PX: PAROTIDECTOMY: SHX2163

## 2022-05-14 LAB — GLUCOSE, CAPILLARY
Glucose-Capillary: 76 mg/dL (ref 70–99)
Glucose-Capillary: 92 mg/dL (ref 70–99)
Glucose-Capillary: 103 mg/dL — ABNORMAL HIGH (ref 70–99)
Glucose-Capillary: 113 mg/dL — ABNORMAL HIGH (ref 70–99)
Glucose-Capillary: 159 mg/dL — ABNORMAL HIGH (ref 70–99)
Glucose-Capillary: 288 mg/dL — ABNORMAL HIGH (ref 70–99)

## 2022-05-14 SURGERY — EXCISION, PAROTID GLAND
Anesthesia: General | Laterality: Right

## 2022-05-14 MED ORDER — CHLORHEXIDINE GLUCONATE 0.12 % MT SOLN
15.0000 mL | Freq: Once | OROMUCOSAL | Status: AC
  Administered 2022-05-14: 15 mL via OROMUCOSAL
  Filled 2022-05-14: qty 15

## 2022-05-14 MED ORDER — LIDOCAINE 2% (20 MG/ML) 5 ML SYRINGE
INTRAMUSCULAR | Status: AC
  Filled 2022-05-14: qty 5

## 2022-05-14 MED ORDER — KETAMINE HCL 10 MG/ML IJ SOLN
INTRAMUSCULAR | Status: DC | PRN
Start: 2022-05-14 — End: 2022-05-14
  Administered 2022-05-14: 30 mg via INTRAVENOUS
  Administered 2022-05-14: 10 mg via INTRAVENOUS

## 2022-05-14 MED ORDER — CEFAZOLIN SODIUM-DEXTROSE 1-4 GM/50ML-% IV SOLN
1.0000 g | Freq: Three times a day (TID) | INTRAVENOUS | Status: AC
  Administered 2022-05-14 (×2): 1 g via INTRAVENOUS
  Filled 2022-05-14 (×3): qty 50

## 2022-05-14 MED ORDER — OXYCODONE HCL 5 MG/5ML PO SOLN: 5.0000 mg | Freq: Once | ORAL | Status: DC | PRN

## 2022-05-14 MED ORDER — BACITRACIN ZINC 500 UNIT/GM EX OINT
TOPICAL_OINTMENT | CUTANEOUS | Status: DC | PRN
  Administered 2022-05-14: 1 via TOPICAL

## 2022-05-14 MED ORDER — AMLODIPINE BESYLATE 10 MG PO TABS
10.0000 mg | ORAL_TABLET | Freq: Every day | ORAL | Status: DC
  Administered 2022-05-14 – 2022-05-15 (×2): 10 mg via ORAL
  Filled 2022-05-14 (×2): qty 1

## 2022-05-14 MED ORDER — DEXAMETHASONE SODIUM PHOSPHATE 10 MG/ML IJ SOLN
INTRAMUSCULAR | Status: AC
  Filled 2022-05-14: qty 1

## 2022-05-14 MED ORDER — MIDAZOLAM HCL 2 MG/2ML IJ SOLN
INTRAMUSCULAR | Status: DC | PRN
  Administered 2022-05-14 (×2): 1 mg via INTRAVENOUS

## 2022-05-14 MED ORDER — ACETAMINOPHEN 500 MG PO TABS
1000.0000 mg | ORAL_TABLET | Freq: Three times a day (TID) | ORAL | Status: DC | PRN
  Administered 2022-05-14: 1000 mg via ORAL
  Filled 2022-05-14: qty 2

## 2022-05-14 MED ORDER — METOPROLOL SUCCINATE ER 25 MG PO TB24
100.0000 mg | ORAL_TABLET | Freq: Once | ORAL | Status: AC
  Administered 2022-05-14: 100 mg via ORAL
  Filled 2022-05-14: qty 4

## 2022-05-14 MED ORDER — LOPERAMIDE HCL 2 MG PO CAPS: 4.0000 mg | ORAL_CAPSULE | ORAL | Status: DC | PRN

## 2022-05-14 MED ORDER — OXYBUTYNIN CHLORIDE ER 10 MG PO TB24
10.0000 mg | ORAL_TABLET | Freq: Every day | ORAL | Status: DC
  Filled 2022-05-14 (×2): qty 1

## 2022-05-14 MED ORDER — LIDOCAINE-EPINEPHRINE 1 %-1:100000 IJ SOLN
INTRAMUSCULAR | Status: AC
  Filled 2022-05-14: qty 1

## 2022-05-14 MED ORDER — LACTATED RINGERS IV SOLN: INTRAVENOUS | Status: DC

## 2022-05-14 MED ORDER — ONDANSETRON HCL 4 MG/2ML IJ SOLN
INTRAMUSCULAR | Status: AC
  Filled 2022-05-14: qty 2

## 2022-05-14 MED ORDER — GLYCOPYRROLATE PF 0.2 MG/ML IJ SOSY
PREFILLED_SYRINGE | INTRAMUSCULAR | Status: DC | PRN
  Administered 2022-05-14: .1 mg via INTRAVENOUS

## 2022-05-14 MED ORDER — INSULIN ASPART 100 UNIT/ML IJ SOLN: 0.0000 [IU] | INTRAMUSCULAR | Status: DC | PRN

## 2022-05-14 MED ORDER — ONDANSETRON HCL 4 MG/2ML IJ SOLN: 4.0000 mg | INTRAMUSCULAR | Status: DC | PRN

## 2022-05-14 MED ORDER — UMECLIDINIUM-VILANTEROL 62.5-25 MCG/ACT IN AEPB
1.0000 | INHALATION_SPRAY | Freq: Every morning | RESPIRATORY_TRACT | Status: DC
  Administered 2022-05-15: 1 via RESPIRATORY_TRACT
  Filled 2022-05-14: qty 14

## 2022-05-14 MED ORDER — LIDOCAINE-EPINEPHRINE 1 %-1:100000 IJ SOLN
INTRAMUSCULAR | Status: DC | PRN
  Administered 2022-05-14: 5.5 mL

## 2022-05-14 MED ORDER — SUCCINYLCHOLINE CHLORIDE 200 MG/10ML IV SOSY
PREFILLED_SYRINGE | INTRAVENOUS | Status: AC
  Filled 2022-05-14: qty 10

## 2022-05-14 MED ORDER — ALBUTEROL SULFATE HFA 108 (90 BASE) MCG/ACT IN AERS: 2.0000 | INHALATION_SPRAY | Freq: Four times a day (QID) | RESPIRATORY_TRACT | Status: DC | PRN

## 2022-05-14 MED ORDER — INSULIN ASPART 100 UNIT/ML IJ SOLN
0.0000 [IU] | Freq: Every day | INTRAMUSCULAR | Status: DC
  Administered 2022-05-14: 3 [IU] via SUBCUTANEOUS

## 2022-05-14 MED ORDER — DIPHENHYDRAMINE HCL 25 MG PO CAPS: 50.0000 mg | ORAL_CAPSULE | Freq: Four times a day (QID) | ORAL | Status: DC | PRN

## 2022-05-14 MED ORDER — METFORMIN HCL 500 MG PO TABS
500.0000 mg | ORAL_TABLET | Freq: Two times a day (BID) | ORAL | Status: DC
  Administered 2022-05-14 – 2022-05-15 (×3): 500 mg via ORAL
  Filled 2022-05-14 (×3): qty 1

## 2022-05-14 MED ORDER — PREGABALIN 100 MG PO CAPS
200.0000 mg | ORAL_CAPSULE | Freq: Three times a day (TID) | ORAL | Status: DC
  Administered 2022-05-14 – 2022-05-15 (×5): 200 mg via ORAL
  Filled 2022-05-14 (×5): qty 2

## 2022-05-14 MED ORDER — BACLOFEN 10 MG PO TABS
5.0000 mg | ORAL_TABLET | Freq: Two times a day (BID) | ORAL | Status: DC
  Administered 2022-05-14 – 2022-05-15 (×3): 5 mg via ORAL
  Filled 2022-05-14 (×3): qty 1

## 2022-05-14 MED ORDER — ROSUVASTATIN CALCIUM 20 MG PO TABS
20.0000 mg | ORAL_TABLET | Freq: Every morning | ORAL | Status: DC
  Administered 2022-05-14 – 2022-05-15 (×2): 20 mg via ORAL
  Filled 2022-05-14 (×3): qty 1

## 2022-05-14 MED ORDER — OXYCODONE HCL 5 MG PO TABS
5.0000 mg | ORAL_TABLET | Freq: Four times a day (QID) | ORAL | Status: DC | PRN
  Administered 2022-05-14 – 2022-05-15 (×2): 5 mg via ORAL
  Filled 2022-05-14 (×2): qty 1

## 2022-05-14 MED ORDER — ONDANSETRON HCL 4 MG PO TABS: 4.0000 mg | ORAL_TABLET | ORAL | Status: DC | PRN

## 2022-05-14 MED ORDER — ONDANSETRON HCL 4 MG/2ML IJ SOLN: 4.0000 mg | Freq: Once | INTRAMUSCULAR | Status: DC | PRN

## 2022-05-14 MED ORDER — OXYCODONE HCL 5 MG PO TABS: 5.0000 mg | ORAL_TABLET | Freq: Once | ORAL | Status: DC | PRN

## 2022-05-14 MED ORDER — BACITRACIN ZINC 500 UNIT/GM EX OINT
1.0000 "application " | TOPICAL_OINTMENT | Freq: Three times a day (TID) | CUTANEOUS | Status: DC
  Administered 2022-05-14 – 2022-05-15 (×4): 1 via TOPICAL
  Filled 2022-05-14 (×2): qty 28.35

## 2022-05-14 MED ORDER — MIRABEGRON ER 25 MG PO TB24
50.0000 mg | ORAL_TABLET | Freq: Every day | ORAL | Status: DC
  Administered 2022-05-14 – 2022-05-15 (×2): 50 mg via ORAL
  Filled 2022-05-14 (×2): qty 2

## 2022-05-14 MED ORDER — SUCCINYLCHOLINE CHLORIDE 200 MG/10ML IV SOSY
PREFILLED_SYRINGE | INTRAVENOUS | Status: DC | PRN
  Administered 2022-05-14: 120 mg via INTRAVENOUS

## 2022-05-14 MED ORDER — POTASSIUM CHLORIDE IN NACL 20-0.45 MEQ/L-% IV SOLN
INTRAVENOUS | Status: DC
  Filled 2022-05-14 (×3): qty 1000

## 2022-05-14 MED ORDER — VIBEGRON 75 MG PO TABS: 75.0000 mg | ORAL_TABLET | Freq: Every day | ORAL | Status: DC

## 2022-05-14 MED ORDER — ONDANSETRON HCL 4 MG/2ML IJ SOLN
INTRAMUSCULAR | Status: DC | PRN
  Administered 2022-05-14: 4 mg via INTRAVENOUS

## 2022-05-14 MED ORDER — PROPOFOL 10 MG/ML IV BOLUS
INTRAVENOUS | Status: AC
  Filled 2022-05-14: qty 20

## 2022-05-14 MED ORDER — CEFAZOLIN SODIUM-DEXTROSE 2-4 GM/100ML-% IV SOLN
2.0000 g | INTRAVENOUS | Status: AC
  Administered 2022-05-14: 2 g via INTRAVENOUS
  Filled 2022-05-14: qty 100

## 2022-05-14 MED ORDER — BACITRACIN ZINC 500 UNIT/GM EX OINT
TOPICAL_OINTMENT | CUTANEOUS | Status: AC
  Filled 2022-05-14: qty 28.35

## 2022-05-14 MED ORDER — ASPIRIN 81 MG PO TBEC
81.0000 mg | DELAYED_RELEASE_TABLET | Freq: Every day | ORAL | Status: DC
  Administered 2022-05-14 – 2022-05-15 (×2): 81 mg via ORAL
  Filled 2022-05-14 (×2): qty 1

## 2022-05-14 MED ORDER — PROPOFOL 10 MG/ML IV BOLUS
INTRAVENOUS | Status: DC | PRN
  Administered 2022-05-14: 120 mg via INTRAVENOUS

## 2022-05-14 MED ORDER — ALBUMIN HUMAN 5 % IV SOLN
INTRAVENOUS | Status: AC
  Filled 2022-05-14: qty 250

## 2022-05-14 MED ORDER — PHENYLEPHRINE 80 MCG/ML (10ML) SYRINGE FOR IV PUSH (FOR BLOOD PRESSURE SUPPORT)
PREFILLED_SYRINGE | INTRAVENOUS | Status: AC
  Filled 2022-05-14: qty 10

## 2022-05-14 MED ORDER — HYDROCHLOROTHIAZIDE 25 MG PO TABS
25.0000 mg | ORAL_TABLET | Freq: Every day | ORAL | Status: DC
  Administered 2022-05-14 – 2022-05-15 (×2): 25 mg via ORAL
  Filled 2022-05-14 (×2): qty 1

## 2022-05-14 MED ORDER — TRAZODONE HCL 100 MG PO TABS
200.0000 mg | ORAL_TABLET | Freq: Every evening | ORAL | Status: DC | PRN
  Administered 2022-05-14: 200 mg via ORAL
  Filled 2022-05-14: qty 2

## 2022-05-14 MED ORDER — FUROSEMIDE 40 MG PO TABS: 40.0000 mg | ORAL_TABLET | Freq: Every day | ORAL | Status: DC | PRN

## 2022-05-14 MED ORDER — ALBUTEROL SULFATE HFA 108 (90 BASE) MCG/ACT IN AERS
INHALATION_SPRAY | RESPIRATORY_TRACT | Status: DC | PRN
  Administered 2022-05-14: 5 via RESPIRATORY_TRACT

## 2022-05-14 MED ORDER — MIDAZOLAM HCL 2 MG/2ML IJ SOLN
INTRAMUSCULAR | Status: AC
  Filled 2022-05-14: qty 2

## 2022-05-14 MED ORDER — 0.9 % SODIUM CHLORIDE (POUR BTL) OPTIME
TOPICAL | Status: DC | PRN
  Administered 2022-05-14: 1000 mL

## 2022-05-14 MED ORDER — SALONPAS PAIN RELIEF PATCH EX PTCH
4.0000 | MEDICATED_PATCH | Freq: Every day | CUTANEOUS | Status: DC | PRN
Start: 2022-05-14 — End: 2022-05-14

## 2022-05-14 MED ORDER — DULOXETINE HCL 30 MG PO CPEP
30.0000 mg | ORAL_CAPSULE | Freq: Two times a day (BID) | ORAL | Status: DC
  Administered 2022-05-14 – 2022-05-15 (×3): 30 mg via ORAL
  Filled 2022-05-14 (×3): qty 1

## 2022-05-14 MED ORDER — FENTANYL CITRATE (PF) 250 MCG/5ML IJ SOLN
INTRAMUSCULAR | Status: AC
  Filled 2022-05-14: qty 5

## 2022-05-14 MED ORDER — FENTANYL CITRATE (PF) 100 MCG/2ML IJ SOLN: 25.0000 ug | INTRAMUSCULAR | Status: DC | PRN

## 2022-05-14 MED ORDER — PHENYLEPHRINE HCL-NACL 20-0.9 MG/250ML-% IV SOLN
INTRAVENOUS | Status: DC | PRN
  Administered 2022-05-14: 40 ug/min via INTRAVENOUS

## 2022-05-14 MED ORDER — LIDOCAINE 2% (20 MG/ML) 5 ML SYRINGE
INTRAMUSCULAR | Status: DC | PRN
Start: 2022-05-14 — End: 2022-05-14
  Administered 2022-05-14: 80 mg via INTRAVENOUS

## 2022-05-14 MED ORDER — FENTANYL CITRATE (PF) 250 MCG/5ML IJ SOLN
INTRAMUSCULAR | Status: DC | PRN
  Administered 2022-05-14 (×2): 50 ug via INTRAVENOUS
  Administered 2022-05-14: 100 ug via INTRAVENOUS

## 2022-05-14 MED ORDER — DEXAMETHASONE SODIUM PHOSPHATE 10 MG/ML IJ SOLN
INTRAMUSCULAR | Status: DC | PRN
  Administered 2022-05-14: 5 mg via INTRAVENOUS

## 2022-05-14 MED ORDER — ALBUTEROL SULFATE (2.5 MG/3ML) 0.083% IN NEBU: 2.5000 mg | INHALATION_SOLUTION | Freq: Four times a day (QID) | RESPIRATORY_TRACT | Status: DC | PRN

## 2022-05-14 MED ORDER — INSULIN ASPART 100 UNIT/ML IJ SOLN
0.0000 [IU] | Freq: Three times a day (TID) | INTRAMUSCULAR | Status: DC
  Administered 2022-05-14 – 2022-05-15 (×2): 3 [IU] via SUBCUTANEOUS
  Administered 2022-05-15 (×2): 5 [IU] via SUBCUTANEOUS

## 2022-05-14 MED ORDER — PHENYLEPHRINE 80 MCG/ML (10ML) SYRINGE FOR IV PUSH (FOR BLOOD PRESSURE SUPPORT)
PREFILLED_SYRINGE | INTRAVENOUS | Status: DC | PRN
  Administered 2022-05-14: 160 ug via INTRAVENOUS

## 2022-05-14 MED ORDER — METOPROLOL SUCCINATE ER 100 MG PO TB24
100.0000 mg | ORAL_TABLET | Freq: Every day | ORAL | Status: DC
  Administered 2022-05-15: 100 mg via ORAL
  Filled 2022-05-14 (×2): qty 1

## 2022-05-14 MED ORDER — ORAL CARE MOUTH RINSE: 15.0000 mL | Freq: Once | OROMUCOSAL | Status: AC

## 2022-05-14 MED ORDER — ALBUTEROL SULFATE HFA 108 (90 BASE) MCG/ACT IN AERS
INHALATION_SPRAY | RESPIRATORY_TRACT | Status: AC
  Filled 2022-05-14: qty 6.7

## 2022-05-14 MED ORDER — KETAMINE HCL 50 MG/5ML IJ SOSY
PREFILLED_SYRINGE | INTRAMUSCULAR | Status: AC
  Filled 2022-05-14: qty 5

## 2022-05-14 SURGICAL SUPPLY — 74 items
APL SKNCLS STERI-STRIP NONHPOA (GAUZE/BANDAGES/DRESSINGS) ×2
ATTRACTOMAT 16X20 MAGNETIC DRP (DRAPES) IMPLANT
BAG COUNTER SPONGE SURGICOUNT (BAG) ×4 IMPLANT
BAG SPNG CNTER NS LX DISP (BAG) ×2
BENZOIN TINCTURE PRP APPL 2/3 (GAUZE/BANDAGES/DRESSINGS) ×1 IMPLANT
BLADE CLIPPER SURG (BLADE) IMPLANT
BLADE SURG 15 STRL LF DISP TIS (BLADE) IMPLANT
BLADE SURG 15 STRL SS (BLADE)
CANISTER SUCT 3000ML PPV (MISCELLANEOUS) ×4 IMPLANT
CLEANER TIP ELECTROSURG 2X2 (MISCELLANEOUS) ×4 IMPLANT
CNTNR URN SCR LID CUP LEK RST (MISCELLANEOUS) ×6 IMPLANT
CONT SPEC 4OZ STRL OR WHT (MISCELLANEOUS) ×6
CORD BIPOLAR FORCEPS 12FT (ELECTRODE) ×4 IMPLANT
COVER SURGICAL LIGHT HANDLE (MISCELLANEOUS) ×4 IMPLANT
DRAIN JACKSON RD 7FR 3/32 (WOUND CARE) ×1 IMPLANT
DRAPE HALF SHEET 40X57 (DRAPES) IMPLANT
DRAPE INCISE 13X13 STRL (DRAPES) ×4 IMPLANT
DRAPE SURG 17X23 STRL (DRAPES) IMPLANT
ELECT COATED BLADE 2.86 ST (ELECTRODE) ×4 IMPLANT
ELECT PAIRED SUBDERMAL (MISCELLANEOUS) ×3
ELECT REM PT RETURN 9FT ADLT (ELECTROSURGICAL) ×3
ELECTRODE PAIRED SUBDERMAL (MISCELLANEOUS) ×3 IMPLANT
ELECTRODE REM PT RTRN 9FT ADLT (ELECTROSURGICAL) ×3 IMPLANT
EVACUATOR SILICONE 100CC (DRAIN) ×4 IMPLANT
FORCEPS BIPOLAR SPETZLER 8 1.0 (NEUROSURGERY SUPPLIES) ×4 IMPLANT
GAUZE 4X4 16PLY ~~LOC~~+RFID DBL (SPONGE) ×4 IMPLANT
GAUZE SPONGE 4X4 12PLY STRL (GAUZE/BANDAGES/DRESSINGS) IMPLANT
GLOVE BIO SURGEON STRL SZ 6 (GLOVE) ×4 IMPLANT
GLOVE BIO SURGEON STRL SZ7.5 (GLOVE) ×4 IMPLANT
GOWN STRL REUS W/ TWL LRG LVL3 (GOWN DISPOSABLE) ×6 IMPLANT
GOWN STRL REUS W/TWL LRG LVL3 (GOWN DISPOSABLE) ×6
HOLDER TRACH TUBE VELCRO 19.5 (MISCELLANEOUS) IMPLANT
KIT BASIN OR (CUSTOM PROCEDURE TRAY) ×4 IMPLANT
KIT TURNOVER KIT B (KITS) ×4 IMPLANT
LOCATOR NERVE 3 VOLT (DISPOSABLE) IMPLANT
LOOP VESSEL MINI RED (MISCELLANEOUS) ×4 IMPLANT
NDL HYPO 25GX1X1/2 BEV (NEEDLE) ×3 IMPLANT
NEEDLE HYPO 25GX1X1/2 BEV (NEEDLE) ×3 IMPLANT
NS IRRIG 1000ML POUR BTL (IV SOLUTION) ×8 IMPLANT
PAD ARMBOARD 7.5X6 YLW CONV (MISCELLANEOUS) ×8 IMPLANT
PENCIL SMOKE EVACUATOR (MISCELLANEOUS) ×5 IMPLANT
POSITIONER HEAD DONUT 9IN (MISCELLANEOUS) IMPLANT
PROBE NERVBE PRASS .33 (MISCELLANEOUS) ×4 IMPLANT
SHEARS HARMONIC 9CM CVD (BLADE) ×4 IMPLANT
SPECIMEN JAR MEDIUM (MISCELLANEOUS) ×4 IMPLANT
SPONGE INTESTINAL PEANUT (DISPOSABLE) IMPLANT
SPONGE T-LAP 18X18 ~~LOC~~+RFID (SPONGE) ×4 IMPLANT
STAPLER VISISTAT 35W (STAPLE) ×4 IMPLANT
SUT CHROMIC 3 0 PS 2 (SUTURE) IMPLANT
SUT ETHILON 2 0 FS 18 (SUTURE) ×4 IMPLANT
SUT ETHILON 5 0 P 3 18 (SUTURE) ×3
SUT ETHILON 5 0 PS 2 18 (SUTURE) IMPLANT
SUT NYLON ETHILON 5-0 P-3 1X18 (SUTURE) ×3 IMPLANT
SUT SILK 2 0 (SUTURE)
SUT SILK 2 0 PERMA HAND 18 BK (SUTURE) ×4 IMPLANT
SUT SILK 2 0 SH CR/8 (SUTURE) ×4 IMPLANT
SUT SILK 2-0 18XBRD TIE 12 (SUTURE) IMPLANT
SUT SILK 3 0 (SUTURE) ×3
SUT SILK 3 0 REEL (SUTURE) ×4 IMPLANT
SUT SILK 3 0 SH CR/8 (SUTURE) ×4 IMPLANT
SUT SILK 3-0 18XBRD TIE 12 (SUTURE) ×3 IMPLANT
SUT SILK 4 0 (SUTURE) ×3
SUT SILK 4-0 18XBRD TIE 12 (SUTURE) ×3 IMPLANT
SUT VIC AB 3-0 SH 27 (SUTURE) ×6
SUT VIC AB 3-0 SH 27X BRD (SUTURE) ×3 IMPLANT
SUT VIC AB 4-0 PS2 27 (SUTURE) IMPLANT
TOWEL GREEN STERILE FF (TOWEL DISPOSABLE) ×4 IMPLANT
TRAY ENT MC OR (CUSTOM PROCEDURE TRAY) ×4 IMPLANT
TUBE TRACH  6.0 CUFF FLEX (MISCELLANEOUS)
TUBE TRACH 6.0 CUFF FLEX (MISCELLANEOUS) IMPLANT
TUBE TRACH FLEX 8.0 CUFF (MISCELLANEOUS)
TUBE TRACH FLEX 8.5 CUFF (MISCELLANEOUS) IMPLANT
UNDERPAD 30X36 HEAVY ABSORB (UNDERPADS AND DIAPERS) IMPLANT
WATER STERILE IRR 1000ML POUR (IV SOLUTION) ×4 IMPLANT

## 2022-05-14 NOTE — H&P (Signed)
Jennifer [name] is an 61 y.o. female.   Chief Complaint: Parotid and scalp masses HPI: 61 year old female with long-standing right parotid and occipital scalp masses.  Past Medical History:  Diagnosis Date   Acute vestibular syndrome, resolved 03/02/2021   Angioedema 06/14/2021   Asthma    Burn of finger of right hand, second degree 02/05/2021   Occurred during cooking (frying), poor sensation due to neuropathy, pt punctured blister to allow it to drain, skin has since desquamated over dorsal joint, no infection.  Keep clean and dry, OTC antibacterial ointment.   Cataract    CHF (congestive heart failure) (HCC)    Chronic bronchitis ([place])    "I get it alot" (09/28/2013)   Chronic diastolic heart failure (HCC)    grade 2 per 2D echocardiogram (01/2013)   Chronic kidney disease    Chronic lower back pain    Chronic pain syndrome 12/03/2011   Likely secondary to depression, "fibromyalgia", neuropathy, and obesity. Lumbar MRI 2014 no sig change from prior (2008) : Stable hypertrophic facet disease most notable at L4-5. Stable shallow left foraminal/extraforaminal disc protrusion at L4-5. No direct neural compression.       COPD 01/08/2007   PFT's 05/2007 : FEV1/FVC 82, FEV1 64% pred, FEF 25-75% 40% predicted, 16% improvement in FEV1 with bronchodilators.      Depression    Diabetic peripheral neuropathy (HCC)    Dizziness, resolved (admitted with vestibular migraine)    DVT of upper extremity (deep vein thrombosis) ([place]) 03/11/2013   Secondary to PICC line. Right brachial vein, diagnosed on 03/10/2013 Coumadin for 3 months. End date 06/10/2013    Environmental allergies    Hx: of   Fatty liver 2003   observed on ultrasound abdomen   Fibromyalgia    GERD (gastroesophageal reflux disease)    Glaucoma    Headache    History of bacterial endocarditis 2014   Endocarditis involving mitral and tricuspid valves.  S. Aureus and GBS.    History of use of hearing aid    Hyperlipidemia     Hyperplastic colon polyp 12/2010   Per colonoscopy (12/2010) - Dr. 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 2/2 tobacco use, DM, and cont weight bearing on surgical wound and developed gangrene    Spinal stenosis    Tobacco abuse    Toe ulcer, right 4th ([place]) leading to osteomylitis 07/08/2021   Right fourth toe turned dark, alerting her to abnormality, "it split open and drained".  Evaluated on 07/08/2021 by podiatrist Dr. Amalia Hailey who debrided necrotic tissue and prescribed doxycycline.  He will see her again in 3 weeks.  The location of this ulcer on the dorsal aspect of the toe is somewhat atypical for a purely diabetic foot wound, and she does have a strong DP pulse.  I did not examine h   Type II diabetes mellitus with peripheral circulatory disorders, uncontrolled DX: 1993   Insulin dep. Poor control. Complicated by diabetic foot ulcer and diabetic eye disease.      Past Surgical History:  Procedure Laterality Date   ABDOMINAL HYSTERECTOMY  1997  secondary to uterine fibroids   AMPUTATION Left 08/31/2013   Procedure: AMPUTATION RAY;  Surgeon: Newt Minion, MD;  Location: [place];  Service: Orthopedics;  Laterality: Left;  Left Foot 5th Ray Amputation   AMPUTATION Left 09/28/2013   Procedure: Left Midfoot amputation;  Surgeon: Newt Minion, MD;  Location: [place];  Service: Orthopedics;  Laterality: Left;  Left Midfoot amputation   AMPUTATION Left 10/14/2013   Procedure: AMPUTATION BELOW KNEE- left;  Surgeon: Newt Minion, MD;  Location: [place];  Service: Orthopedics;  Laterality: Left;  Left Below Knee Amputation     AMPUTATION TOE Right 01/15/2017   Procedure: AMPUTATION 5th TOE RIGHT FOOT;  Surgeon: Edrick Kins, DPM;  Location: [place];  Service: Podiatry;  Laterality: Right;   APPLICATION OF WOUND VAC  04/01/2019   Procedure: Application Of Wound Vac;  Surgeon: Newt Minion, MD;  Location: [place];  Service: Orthopedics;;   BLADDER SURGERY     bladder reconstruction surgery   BOTOX INJECTION N/A 08/21/2021   Procedure: CYSTOSCOPY BOTOX INJECTION;  Surgeon: Lucas Mallow, MD;  Location: [facility];  Service: Urology;  Laterality: N/A;   BREAST BIOPSY     multiple-benign per pt   CATARACT EXTRACTION, BILATERAL     summer 2022   COLONOSCOPY     DILATION AND CURETTAGE OF UTERUS  1985   ESOPHAGOGASTRODUODENOSCOPY N/A 09/20/2013   Procedure: ESOPHAGOGASTRODUODENOSCOPY (EGD);  Surgeon: Jerene Bears, MD;  Location: [place];  Service: Gastroenterology;  Laterality: N/A;   EYE SURGERY Bilateral 2022   Cataract removal in June and then July   FOOT AMPUTATION THROUGH METATARSAL Left 09/28/2013   GANGLION CYST EXCISION     multiple   PERIPHERAL VASCULAR INTERVENTION     stents in lower ext   SHOULDER ARTHROSCOPY Right 11/11/2019   RIGHT SHOULDER ARTHROSCOPY AND DEBRIDEMENT    SHOULDER ARTHROSCOPY Right 11/11/2019   Procedure: RIGHT SHOULDER ARTHROSCOPY AND DEBRIDEMENT;  Surgeon: Newt Minion, MD;  Location: [place];  Service: Orthopedics;  Laterality: Right;   SHOULDER ARTHROSCOPY W/ ROTATOR CUFF REPAIR Bilateral    2 on right one on left   SKIN SPLIT GRAFT Bilateral 05/13/2013   Procedure: Right and Left Foot Allograft Skin Graft;  Surgeon: Newt Minion, MD;  Location: [place];  Service: Orthopedics;  Laterality: Bilateral;  Right and Left Foot Allograft Skin Graft   STUMP REVISION Left 04/01/2019   Procedure: REVISION LEFT BELOW KNEE AMPUTATION;  Surgeon: Newt Minion, MD;  Location: [place];  Service: Orthopedics;  Laterality: Left;   TEE WITHOUT CARDIOVERSION N/A 01/31/2013   Procedure:  TRANSESOPHAGEAL ECHOCARDIOGRAM (TEE);  Surgeon: Fay Records, MD;  [facility];  Service: Cardiovascular;  Laterality: N/A;  Rm [id number]   TEE WITHOUT CARDIOVERSION N/A 03/10/2013   Procedure: TRANSESOPHAGEAL ECHOCARDIOGRAM (TEE);  Surgeon: Larey Dresser, MD;  Location: [place];  Service: Cardiovascular;  Laterality: N/A;  Rm. [id number]   TOE AMPUTATION Left 08/31/2013   4TH & 5 TH TOE    TONSILLECTOMY     TUBAL LIGATION     WRIST SURGERY Right    "for tumors" (09/28/2013)    Family History  Problem Relation Age of Onset   Diverticulosis Mother    Diabetes Mother    Hypertension Mother    Congestive Heart Failure Mother    Asthma Father    CAD Sister 31       MI at age 57 per patient.  However, she  has not had a stent or CABG.    Heart disease Sister        before age 47   Breast cancer Neg Hx    Social History:  reports that she has been smoking cigarettes. She started smoking about 51 years ago. She has a 25.00 pack-year smoking history. She has never used smokeless tobacco. She reports that she does not drink alcohol and does not use drugs.  Allergies:  Allergies  Allergen Reactions   Benazepril Swelling    Possible tongue swelling 06/14/2021   Chantix [Varenicline]     Unknown reaction   Pineapple Swelling    Fresh pineapple - lip swelling   Abilify [Aripiprazole] Other (See Comments)    Urinary freq Nov 2016   Iohexol Other (See Comments)    IV CONTRAST CAUSED TRANSIENT NEPHROPATHY (KIDNEY INSUFFICIENCY) IN 2007    Ivp Dye [Iodinated Contrast Media] Other (See Comments)    IV CONTRAST CAUSED TRANSIENT NEPHROPATHY (KIDNEY INSUFFICIENCY) IN 2007   Morphine Sulfate Itching and Rash    Medications Prior to Admission  Medication Sig Dispense Refill   albuterol (PROAIR HFA) 108 (90 Base) MCG/ACT inhaler INHALE 2 PUFFS BY MOUTH EVERY 6 HOURS AS NEEDED FOR WHEEZING (Patient taking differently: Inhale 2 puffs into the lungs every 6 (six) hours as needed for wheezing  or shortness of breath. INHALE 2 PUFFS BY MOUTH EVERY 6 HOURS AS NEEDED FOR WHEEZING) 54 g 3   amLODipine (NORVASC) 10 MG tablet Take 10 mg by mouth daily.     aspirin EC 81 MG tablet Take 1 tablet (81 mg total) by mouth daily. 30 tablet    baclofen (LIORESAL) 10 MG tablet Take 0.5 tablets (5 mg total) by mouth 2 (two) times daily. 60 each 0   Continuous Blood Gluc Sensor (FREESTYLE LIBRE 3 SENSOR) MISC 1 each by Does not apply route 6 (six) times daily. Place 1 sensor on the skin every 14 days. Use to check glucose continuously 2 each 2   diclofenac Sodium (VOLTAREN) 1 % GEL Apply 2 g topically 4 (four) times daily. Use on the middle finger of the right hand. Do not use on any wounds. 100 g 1   diphenhydrAMINE (BENADRYL) 25 MG tablet Take 50 mg by mouth every 6 (six) hours as needed for itching.     DULoxetine (CYMBALTA) 30 MG capsule Take 1 capsule (30 mg total) by mouth 2 (two) times daily. 60 capsule 3   furosemide (LASIX) 20 MG tablet Take 40 mg by mouth daily as needed for edema.     hydrochlorothiazide (HYDRODIURIL) 25 MG tablet Take 25 mg by mouth daily.     Menthol-Methyl Salicylate (SALONPAS PAIN RELIEF PATCH) PTCH Apply 4-6 patches topically daily as needed (pain).     metFORMIN (GLUCOPHAGE) 500 MG tablet Take 1 tablet (500 mg total) by mouth 2 (two) times daily. 90 tablet 3   metoprolol succinate (TOPROL-XL) 100 MG 24 hr tablet TAKE 1 TABLET BY MOUTH DAILY. TAKE WITH OR IMMEDIATELY FOLLOWING A MEAL. 90 tablet 3   nystatin powder Apply 1 application topically 3 (three) times daily as needed. (Patient taking differently: Apply 1 application. topically 3 (three) times daily.) 30 g 5   OVER THE COUNTER MEDICATION Take 1 tablet by mouth daily. Brain Health otc supplement     oxyCODONE (OXY IR/ROXICODONE) 5 MG immediate release tablet Take 1 tablet (5 mg total) by mouth every 6 (six) hours as needed for severe pain. 120 tablet 0  pregabalin (LYRICA) 200 MG capsule TAKE 1 CAPSULE BY MOUTH THREE  TIMES A DAY 90 capsule 3   rosuvastatin (CRESTOR) 20 MG tablet Take 1 tablet (20 mg total) by mouth in the morning. 90 tablet 3   Semaglutide, 1 MG/DOSE, (OZEMPIC, 1 MG/DOSE,) 4 MG/3ML SOPN Inject 1 mg into the skin once a week. 24 mL 11   TRESIBA FLEXTOUCH 200 UNIT/ML FlexTouch Pen INJECT 70 UNITS UNDER THE SKIN DAILY 9 mL 3   umeclidinium-vilanterol (ANORO ELLIPTA) 62.5-25 MCG/INH AEPB Inhale 1 puff into the lungs in the morning. 1 each 6   Vibegron (GEMTESA) 75 MG TABS Take 75 mg by mouth daily.     acetaminophen (TYLENOL) 650 MG CR tablet Take 1,300 mg by mouth every 8 (eight) hours as needed for pain.     Blood Glucose Monitoring Suppl (ONETOUCH VERIO FLEX SYSTEM) w/Device KIT Check 4 times a day 1 kit 1   glucose blood (ONETOUCH VERIO) test strip 1 each by Other route as needed for other. Use as instructed. Tests 3-4 times daily. 100 each 3   Insulin Pen Needle 32G X 4 MM MISC Use to inject insulin 4 times a day and semaglutide once weekly 100 each 10   Lancets (ONETOUCH ULTRASOFT) lancets Use as instructed 100 each 12   loperamide (IMODIUM A-D) 2 MG tablet Take 4-8 mg by mouth as needed for diarrhea or loose stools.     MYRBETRIQ 50 MG TB24 tablet Take 1 tablet (50 mg total) by mouth daily. (Patient not taking: Reported on 05/05/2022) 90 tablet 3   traZODone (DESYREL) 100 MG tablet Take 2 tablets (200 mg total) by mouth at bedtime as needed for sleep. 60 tablet 11    Results for orders placed or performed during the hospital encounter of 05/14/22 (from the past 48 hour(s))  Glucose, capillary     Status: None   Collection Time: 05/14/22  7:01 AM  Result Value Ref Range   Glucose-Capillary 76 70 - 99 mg/dL    Comment: Glucose reference range applies only to samples taken after fasting for at least 8 hours.   *Note: Due to a large number of results and/or encounters for the requested time period, some results have not been displayed. A complete set of results can be found in Results  Review.   No results found.  Review of Systems  All other systems reviewed and are negative.  Blood pressure 138/73, pulse 68, temperature 97.6 F (36.4 C), temperature source Oral, resp. rate 17, height 5' 5"  (1.651 m), weight 106.6 kg, SpO2 93 %. Physical Exam Constitutional:      Appearance: Normal appearance. She is normal weight.  HENT:     Head: Atraumatic.     Comments: Firm bump of occipital scalp, right of midline.    Right Ear: External ear normal.     Left Ear: External ear normal.     Nose: Nose normal.     Mouth/Throat:     Mouth: Mucous membranes are moist.     Pharynx: Oropharynx is clear.  Eyes:     Extraocular Movements: Extraocular movements intact.     Conjunctiva/sclera: Conjunctivae normal.     Pupils: Pupils are equal, round, and reactive to light.  Neck:     Comments: 1.5 cm round mass anterior to right earlobe. Cardiovascular:     Rate and Rhythm: Normal rate.  Pulmonary:     Effort: Pulmonary effort is normal.  Skin:    General: Skin is warm  and dry.  Neurological:     General: No focal deficit present.     Mental Status: She is alert and oriented to person, place, and time.  Psychiatric:        Mood and Affect: Mood normal.        Behavior: Behavior normal.        Thought Content: Thought content normal.        Judgment: Judgment normal.     Assessment/Plan Right parotid neoplasm, scalp lesion  To OR for right parotidectomy and excision of scalp lesion.  Melida Quitter, MD 05/14/2022, 7:41 AM

## 2022-05-14 NOTE — Brief Op Note (Signed)
05/14/2022  9:41 AM  PATIENT:  Jennifer [name]  61 y.o. female  PRE-OPERATIVE DIAGNOSIS:  Benign neoplasm of parotid gland Epidermoid cyst of skin of scalp  POST-OPERATIVE DIAGNOSIS:  Benign neoplasm of parotid gland Epidermoid cyst of skin of scalp  PROCEDURE:  Procedure(s): PAROTIDECTOMY (Right) EXCISION OF SCALP SKIN CYST, 1.5cm (N/A)  SURGEON:  Surgeon(s) and Role:    Melida Quitter, MD - Primary  PHYSICIAN ASSISTANT:   ASSISTANTS: RNFA   ANESTHESIA:   general  EBL:  30 mL   BLOOD ADMINISTERED:none  DRAINS: (7 Fr) Jackson-Pratt drain(s) with closed bulb suction in the right parotid region    LOCAL MEDICATIONS USED:  LIDOCAINE   SPECIMEN:  Source of Specimen:  right parotid mass, occipital scalp skin lesion  DISPOSITION OF SPECIMEN:  PATHOLOGY  COUNTS:  YES  TOURNIQUET:  * No tourniquets in log *  DICTATION: .Note written in EPIC  PLAN OF CARE: Admit for overnight observation  PATIENT DISPOSITION:  PACU - hemodynamically stable.   Delay start of Pharmacological VTE agent (>24hrs) due to surgical blood loss or risk of bleeding: no

## 2022-05-14 NOTE — Anesthesia Procedure Notes (Signed)
Procedure Name: Intubation Date/Time: 05/14/2022 7:55 AM Performed by: Erick Colace, CRNA Pre-anesthesia Checklist: Patient identified, Emergency Drugs available, Suction available and Patient being monitored Patient Re-evaluated:Patient Re-evaluated prior to induction Oxygen Delivery Method: Circle system utilized Preoxygenation: Pre-oxygenation with 100% oxygen Induction Type: IV induction Ventilation: Mask ventilation without difficulty Laryngoscope Size: Mac and 4 Grade View: Grade I Tube type: Oral Tube size: 7.0 mm Number of attempts: 1 Airway Equipment and Method: Stylet and Oral airway Placement Confirmation: ETT inserted through vocal cords under direct vision, positive ETCO2 and breath sounds checked- equal and bilateral Secured at: 22 cm Tube secured with: Tape Dental Injury: Teeth and Oropharynx as per pre-operative assessment

## 2022-05-14 NOTE — Progress Notes (Signed)
   ENT Progress Note: s/p Procedure(s): PAROTIDECTOMY EXCISION OF SCALP SKIN CYST, 1.5cm   Subjective: Mild postoperative discomfort, tolerating oral intake  Objective: Vital signs in last 24 hours: Temp:  [97.6 F (36.4 C)-98.2 F (36.8 C)] 98 F (36.7 C) (05/24 1105) Pulse Rate:  [65-74] 68 (05/24 1627) Resp:  [6-17] 17 (05/24 1627) BP: (132-139)/(59-74) 139/70 (05/24 1627) SpO2:  [89 %-96 %] 91 % (05/24 1627) Weight:  [106.6 kg] 106.6 kg (05/24 0703) Weight change:     Intake/Output from previous day: No intake/output data recorded. Intake/Output this shift: Total I/O In: 822.7 [P.O.:120; I.V.:652.7; IV Piggyback:50] Out: 30 [Blood:30]  Labs: No results for input(s): WBC, HGB, HCT, PLT in the last 72 hours. No results for input(s): NA, K, CL, CO2, GLUCOSE, BUN, CALCIUM in the last 72 hours.  Invalid input(s): CREATININR  Studies/Results: No results found.   PHYSICAL EXAM: Incision intact, no swelling.  JP drain functional. Mild facial nerve weakness on the right.   Assessment/Plan: Patient stable after parotidectomy, continue oral intake as tolerated.  Monitor overnight and plan discharge in a.m.    Jerrell Belfast 05/14/2022, 6:06 PM

## 2022-05-14 NOTE — Op Note (Signed)
Preop diagnosis: Right parotid neoplasm, occipital scalp lesion Postop diagnosis: same Procedure: Right superficial parotidectomy with dissection of facial nerve, excision of occipital scalp lesion, 1.5 cm Surgeon: Redmond Baseman Assist: None Anesth: General and local with 1% lidocaine with 1:100,000 epinephrine Compl: None Findings: Right upper parotid with 2 cm round mass, occipital scalp with firm bump right of midline Description:  After discussing risks, benefits, and alternatives, the patient was brought to the operative suite and placed on the operative table in the supine position.  Anesthesia was induced and the patient was intubated by the anesthesia team without difficulty.  The patient was given intravenous antibiotics.  The head was turned to the left, exposing the right occipital scalp.  The elliptical excision was marked and injected with local anesthetic.  The scalp was prepped and draped in sterile fashion.  The incision was made with a 15 blade scalpel.  The area was then excised with the scalpel.  Bleeding was controlled with cautery.  The specimen was passed to nursing for pathology.  The surgical site was copiously irrigated with saline.  The subcutaneous tissue was approximated with 3-0 Vicryl and the skin was closed with staples.  Bacitracin ointment was applied.  The head was turned back a bit.  The right parotid incision was marked with a marking pen and injected with local anesthetic.  The nerve integrity monitor was placed in the face for facial nerve monitoring and turned on during the case.  The right face and neck were prepped and draped in sterile fashion.  The incision was made with a 15 blade scalpel and extended through the subcutaneous and platysma layers with Bovie electrocautery.  A pre-parotid flap was elevated anteriorly and the earlobe was freed.  Skin flaps were sutured back with stay sutures.  Dissection was then performed anterior to the tragus and parotid tissue was  elevated off of the mastoid.  With further dissection to the stylomastoid foramen, the main trunk of the facial nerve was identified.  The nerve was dissected to the main division and then down the superior branches, dividing parotid tissue using the harmonic scalpel and bipolar electrocautery.  The superior branches were dissected and kept intact while the superior, superficial gland was freed with the mass within.  The nerve stimulator was used for assistance.  The superior parotid gland with mass was then dissected from surrounding tissues and removed.  This was passed to nursing for pathology.  The surgical site was then irrigated copiously with saline.  A 7 French suction drain was placed in the depth of the wound and secured at the skin with 2-0 Nylon with a standard drain stitch.  The flaps were released and laid back down.  The platysma/subcutaneous layer was closed with 3-0 Vicryl in a simple, running fashion.  The skin was closed with 5-0 Nylon in a simple, running fashion.  The drain was placed to bulb suction.  Bacitracin ointment was added to the incision.  Drapes were removed and the drain was secured to the shoulder with tape.  She was then returned to anesthesia for wake-up and was extubated and moved to the recovery room in stable condition.

## 2022-05-14 NOTE — Transfer of Care (Signed)
Immediate Anesthesia Transfer of Care Note  Patient: Jennifer [name]  Procedure(s) Performed: PAROTIDECTOMY (Right) EXCISION OF SCALP SKIN CYST, 1.5cm  Patient Location: PACU  Anesthesia Type:General  Level of Consciousness: drowsy  Airway & Oxygen Therapy: Patient Spontanous Breathing and Patient connected to face mask oxygen  Post-op Assessment: Report given to RN and Post -op Vital signs reviewed and stable  Post vital signs: Reviewed and stable  Last Vitals:  Vitals Value Taken Time  BP 138/74 05/14/22 0950  Temp    Pulse 71 05/14/22 0952  Resp 8 05/14/22 0952  SpO2 96 % 05/14/22 0952  Vitals shown include unvalidated device data.  Last Pain:  Vitals:   05/14/22 0719  TempSrc:   PainSc: 8       Patients Stated Pain Goal: 4 (35/39/12 2583)  Complications: No notable events documented.

## 2022-05-14 NOTE — Anesthesia Postprocedure Evaluation (Signed)
Anesthesia Post Note  Patient: Jennifer [name]  Procedure(s) Performed: PAROTIDECTOMY (Right) EXCISION OF SCALP SKIN CYST, 1.5cm     Patient location during evaluation: PACU Anesthesia Type: General Level of consciousness: awake and alert and oriented Pain management: pain level controlled Vital Signs Assessment: post-procedure vital signs reviewed and stable Respiratory status: spontaneous breathing, nonlabored ventilation and respiratory function stable Cardiovascular status: blood pressure returned to baseline and stable Postop Assessment: no apparent nausea or vomiting Anesthetic complications: no   No notable events documented.  Last Vitals:  Vitals:   05/14/22 1020 05/14/22 1035  BP: 132/70 134/71  Pulse: 72 70  Resp: 10 10  Temp:  36.8 C  SpO2: 91% 90%    Last Pain:  Vitals:   05/14/22 1035  TempSrc:   PainSc: 0-No pain                 Haliey Romberg A.

## 2022-05-15 ENCOUNTER — Encounter: Payer: Medicare Other | Admitting: Internal Medicine

## 2022-05-15 ENCOUNTER — Encounter (HOSPITAL_COMMUNITY): Payer: Self-pay | Admitting: Otolaryngology

## 2022-05-15 DIAGNOSIS — Z7984 Long term (current) use of oral hypoglycemic drugs: Secondary | ICD-10-CM | POA: Diagnosis not present

## 2022-05-15 DIAGNOSIS — Z7982 Long term (current) use of aspirin: Secondary | ICD-10-CM | POA: Diagnosis not present

## 2022-05-15 DIAGNOSIS — Z79899 Other long term (current) drug therapy: Secondary | ICD-10-CM | POA: Diagnosis not present

## 2022-05-15 DIAGNOSIS — J45909 Unspecified asthma, uncomplicated: Secondary | ICD-10-CM | POA: Diagnosis not present

## 2022-05-15 DIAGNOSIS — F17210 Nicotine dependence, cigarettes, uncomplicated: Secondary | ICD-10-CM | POA: Diagnosis not present

## 2022-05-15 DIAGNOSIS — Z86718 Personal history of other venous thrombosis and embolism: Secondary | ICD-10-CM | POA: Diagnosis not present

## 2022-05-15 DIAGNOSIS — I130 Hypertensive heart and chronic kidney disease with heart failure and stage 1 through stage 4 chronic kidney disease, or unspecified chronic kidney disease: Secondary | ICD-10-CM | POA: Diagnosis not present

## 2022-05-15 DIAGNOSIS — D110 Benign neoplasm of parotid gland: Secondary | ICD-10-CM | POA: Diagnosis not present

## 2022-05-15 DIAGNOSIS — J449 Chronic obstructive pulmonary disease, unspecified: Secondary | ICD-10-CM | POA: Diagnosis not present

## 2022-05-15 DIAGNOSIS — E1122 Type 2 diabetes mellitus with diabetic chronic kidney disease: Secondary | ICD-10-CM | POA: Diagnosis not present

## 2022-05-15 DIAGNOSIS — L720 Epidermal cyst: Secondary | ICD-10-CM | POA: Diagnosis not present

## 2022-05-15 DIAGNOSIS — I5032 Chronic diastolic (congestive) heart failure: Secondary | ICD-10-CM | POA: Diagnosis not present

## 2022-05-15 DIAGNOSIS — N189 Chronic kidney disease, unspecified: Secondary | ICD-10-CM | POA: Diagnosis not present

## 2022-05-15 LAB — GLUCOSE, CAPILLARY
Glucose-Capillary: 237 mg/dL — ABNORMAL HIGH (ref 70–99)
Glucose-Capillary: 195 mg/dL — ABNORMAL HIGH (ref 70–99)
Glucose-Capillary: 203 mg/dL — ABNORMAL HIGH (ref 70–99)

## 2022-05-15 LAB — SURGICAL PATHOLOGY

## 2022-05-15 NOTE — Discharge Summary (Signed)
Physician Discharge Summary  [name] MRN: [id number] DOB/AGE: 63-Mar-1962 63 y.o.  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  See operative note.  She was observed overnight with drain in place.  On POD 1, she is feeling pretty good.  Her drain was removed.  She is felt stable for discharge.  Consults: None  Significant Diagnostic Studies: None  Treatments: surgery: Right parotidectomy and excision of scalp lesion  Discharge Exam: Blood pressure 130/68, pulse 71, temperature 97.9 F (36.6 C), temperature source Oral, resp. rate 16, height 5' 5"  (1.651 m), weight 106.6 kg, SpO2 96 %. General appearance: alert, cooperative, and no distress Head: right parotid incision clean and intact, no fluid collection, drain removed, facial movement good, scalp site stable with staples  Disposition: Discharge disposition: 01-Home or Self Care       Discharge Instructions     Diet - low sodium heart healthy   Complete by: As directed    Discharge instructions   Complete by: As directed    Avoid strenuous activity.  Bland diet only.  Use home pain medicine as needed.  Apply antibiotic ointment to incisions twice daily.   Increase activity slowly   Complete by: As directed    No dressing needed   Complete by: As directed       Allergies as of 05/15/2022       Reactions   Benazepril Swelling   Possible tongue swelling 06/14/2021   Chantix [varenicline]    Unknown reaction   Pineapple Swelling   Fresh pineapple - lip swelling   Abilify [aripiprazole] Other (See Comments)   Urinary freq Nov 2016   Iohexol Other (See Comments)   IV CONTRAST CAUSED TRANSIENT NEPHROPATHY (KIDNEY  INSUFFICIENCY) IN 2007   Ivp Dye [iodinated Contrast Media] Other (See Comments)   IV CONTRAST CAUSED TRANSIENT NEPHROPATHY (KIDNEY INSUFFICIENCY) IN 2007   Morphine Sulfate Itching, Rash        Medication List     TAKE these medications    acetaminophen 650 MG CR tablet Commonly known as: TYLENOL Take 1,300 mg by mouth every 8 (eight) hours as needed for pain.   albuterol 108 (90 Base) MCG/ACT inhaler Commonly known as: ProAir HFA INHALE 2 PUFFS BY MOUTH EVERY 6 HOURS AS NEEDED FOR WHEEZING What changed:  how much to take how to take this when to take this reasons to take this   amLODipine 10 MG tablet Commonly known as: NORVASC Take 10 mg by mouth daily.   Anoro Ellipta 62.5-25 MCG/ACT Aepb Generic drug: umeclidinium-vilanterol Inhale 1 puff into the lungs in the morning.   aspirin EC 81 MG tablet Take 1 tablet (81 mg total) by mouth daily.   baclofen 10 MG tablet Commonly known as: LIORESAL Take 0.5 tablets (5 mg total) by mouth 2 (two) times daily.   diclofenac Sodium 1 % Gel Commonly known as: Voltaren Apply 2 g topically 4 (four) times daily. Use on the middle finger of the right hand. Do not use on any wounds.   diphenhydrAMINE 25 MG tablet Commonly known as: BENADRYL Take 50 mg by mouth every 6 (six) hours as needed for itching.   DULoxetine 30 MG capsule Commonly known as: CYMBALTA Take 1 capsule (30 mg total)  by mouth 2 (two) times daily.   FreeStyle Libre 3 Sensor Misc 1 each by Does not apply route 6 (six) times daily. Place 1 sensor on the skin every 14 days. Use to check glucose continuously   furosemide 20 MG tablet Commonly known as: LASIX Take 40 mg by mouth daily as needed for edema.   Gemtesa 75 MG Tabs Generic drug: Vibegron Take 75 mg by mouth daily.   hydrochlorothiazide 25 MG tablet Commonly known as: HYDRODIURIL Take 25 mg by mouth daily.   Insulin Pen Needle 32G X 4 MM Misc Use to inject insulin 4 times a day and  semaglutide once weekly   loperamide 2 MG tablet Commonly known as: IMODIUM A-D Take 4-8 mg by mouth as needed for diarrhea or loose stools.   metFORMIN 500 MG tablet Commonly known as: GLUCOPHAGE Take 1 tablet (500 mg total) by mouth 2 (two) times daily.   metoprolol succinate 100 MG 24 hr tablet Commonly known as: TOPROL-XL TAKE 1 TABLET BY MOUTH DAILY. TAKE WITH OR IMMEDIATELY FOLLOWING A MEAL.   Myrbetriq 50 MG Tb24 tablet Generic drug: mirabegron ER Take 1 tablet (50 mg total) by mouth daily.   nystatin powder Commonly known as: nystatin Apply 1 application topically 3 (three) times daily as needed. What changed: when to take this   onetouch ultrasoft lancets Use as instructed   OneTouch Verio Flex System w/Device Kit Check 4 times a day   OneTouch Verio test strip Generic drug: glucose blood 1 each by Other route as needed for other. Use as instructed. Tests 3-4 times daily.   OVER THE COUNTER MEDICATION Take 1 tablet by mouth daily. Brain Health otc supplement   oxybutynin 10 MG 24 hr tablet Commonly known as: DITROPAN-XL Take 10 mg by mouth at bedtime.   oxyCODONE 5 MG immediate release tablet Commonly known as: Oxy IR/ROXICODONE Take 1 tablet (5 mg total) by mouth every 6 (six) hours as needed for severe pain.   Ozempic (1 MG/DOSE) 4 MG/3ML Sopn Generic drug: Semaglutide (1 MG/DOSE) Inject 1 mg into the skin once a week.   pregabalin 200 MG capsule Commonly known as: LYRICA TAKE 1 CAPSULE BY MOUTH THREE TIMES A DAY   rosuvastatin 20 MG tablet Commonly known as: CRESTOR Take 1 tablet (20 mg total) by mouth in the morning.   Salonpas Pain Relief Patch Ptch Apply 4-6 patches topically daily as needed (pain).   traZODone 100 MG tablet Commonly known as: DESYREL Take 2 tablets (200 mg total) by mouth at bedtime as needed for sleep.   [name] FlexTouch 200 UNIT/ML FlexTouch Pen Generic drug: insulin degludec INJECT 70 UNITS UNDER THE SKIN DAILY                Discharge Care Instructions  (From admission, onward)           Start     Ordered   05/15/22 0000  No dressing needed        05/15/22 8916            Follow-up Information     Melida Quitter, MD Follow up in 1 week(s).   Specialty: Otolaryngology Contact information: [address] [place] [address] [phone]                 Signed: Melida Quitter 05/15/2022, 7:58 AM

## 2022-05-21 ENCOUNTER — Encounter: Payer: Medicare Other | Admitting: Physical Therapy

## 2022-05-28 ENCOUNTER — Encounter: Payer: Medicare Other | Admitting: Physical Therapy

## 2022-06-04 ENCOUNTER — Other Ambulatory Visit: Payer: Self-pay | Admitting: *Deleted

## 2022-06-04 DIAGNOSIS — G8929 Other chronic pain: Secondary | ICD-10-CM

## 2022-06-04 MED ORDER — OXYCODONE HCL 5 MG PO TABS: 5.0000 mg | ORAL_TABLET | Freq: Four times a day (QID) | ORAL | 0 refills | Status: DC | PRN

## 2022-06-04 MED ORDER — NYSTATIN 100000 UNIT/GM EX POWD: 1.0000 "application " | Freq: Three times a day (TID) | CUTANEOUS | 5 refills | Status: DC | PRN

## 2022-06-04 NOTE — Telephone Encounter (Signed)
Patient called in requesting refill on oxycodone. States she was supposed to p/u yesterday but there were no refills left.  Also, requesting to speak to [facility] today.

## 2022-06-05 ENCOUNTER — Telehealth: Payer: Self-pay | Admitting: Behavioral Health

## 2022-06-05 DIAGNOSIS — E1159 Type 2 diabetes mellitus with other circulatory complications: Secondary | ICD-10-CM | POA: Diagnosis not present

## 2022-06-05 DIAGNOSIS — J449 Chronic obstructive pulmonary disease, unspecified: Secondary | ICD-10-CM | POA: Diagnosis not present

## 2022-06-05 DIAGNOSIS — J441 Chronic obstructive pulmonary disease with (acute) exacerbation: Secondary | ICD-10-CM | POA: Diagnosis not present

## 2022-06-05 DIAGNOSIS — Z89512 Acquired absence of left leg below knee: Secondary | ICD-10-CM | POA: Diagnosis not present

## 2022-06-05 DIAGNOSIS — J9611 Chronic respiratory failure with hypoxia: Secondary | ICD-10-CM | POA: Diagnosis not present

## 2022-06-05 NOTE — Telephone Encounter (Signed)
Spoke w/Son today about arranging an [facility] telehealth session for his Mother. He determined Mon, June 19th @ 2:00pm will work. Will contact Pt on Monday.  Dr. Theodis Shove

## 2022-06-09 ENCOUNTER — Other Ambulatory Visit: Payer: Self-pay

## 2022-06-09 ENCOUNTER — Ambulatory Visit: Payer: Medicare Other | Admitting: Behavioral Health

## 2022-06-09 DIAGNOSIS — F419 Anxiety disorder, unspecified: Secondary | ICD-10-CM

## 2022-06-09 DIAGNOSIS — F331 Major depressive disorder, recurrent, moderate: Secondary | ICD-10-CM

## 2022-06-09 DIAGNOSIS — Z634 Disappearance and death of family member: Secondary | ICD-10-CM

## 2022-06-09 DIAGNOSIS — G8929 Other chronic pain: Secondary | ICD-10-CM

## 2022-06-09 DIAGNOSIS — F4321 Adjustment disorder with depressed mood: Secondary | ICD-10-CM

## 2022-06-09 MED ORDER — OXYCODONE HCL 5 MG PO TABS: 5.0000 mg | ORAL_TABLET | Freq: Four times a day (QID) | ORAL | 0 refills | Status: DC | PRN

## 2022-06-09 NOTE — BH Specialist Note (Addendum)
Integrated Behavioral Health via Telemedicine Visit  06/09/2022 Jennifer [name] [id number]  Number of [place] Clinician visits: 6 Session Start time:  1400 Session End time: 1450 Total time in minutes: 50 min  Referring Provider: Dr. Lenise Herald, MD Patient/Family location: Pt is @ home in private [facility] Provider location: [facility] Office All persons participating in visit: Pt & Clinician Types of Service: Individual psychotherapy  I connected with [name] and/or [name]  self  via  Telephone or Video Enabled Telemedicine Application  (Video is Caregility application) and verified that I am speaking with the correct person using two identifiers. Discussed confidentiality: Yes   I discussed the limitations of telemedicine and the availability of in person appointments.  Discussed there is a possibility of technology failure and discussed alternative modes of communication if that failure occurs.  I discussed that engaging in this telemedicine visit, they consent to the provision of behavioral healthcare and the services will be billed under their insurance.  Patient and/or legal guardian expressed understanding and consented to Telemedicine visit: Yes   Presenting Concerns: Patient and/or family reports the following symptoms/concerns: elevated anx/dep & grief over death of younger [name] over this past wknd on Sat. Duration of problem: since June 7th upon admission from [facility]; Severity of problem: moderate  Patient and/or Family's Strengths/Protective Factors: Social and Emotional competence, Concrete supports in place (healthy food, safe environments, etc.), and Sense of purpose  Goals Addressed: Patient will:  Reduce symptoms of: anxiety, depression, and grief rxn    Increase knowledge and/or ability of: coping skills, healthy habits, and management of grief rxn    Demonstrate ability to: Increase healthy adjustment to current life circumstances  and Begin healthy grieving over loss  Progress towards Goals: Ongoing  Interventions: Interventions utilized:   [facility] Model Standardized Assessments completed:  screeners prn  Pt in midst of review of her Son's life.  Related to Pt her prescription for oxycodone has been filled by Dr. Ezzie Dural, MD & [facility] [address]  Patient and/or Family Response: Pt is receptive to call today & grieving her Son's death  Assessment: Patient currently experiencing distress of grieving over death of youngest Son.   Patient may benefit from cont'd psychotherapy to process the death of her Son.  Pt plans on cremating Son. His body is still in the [facility]. Pt is planning his service today w/her Str [name].   Plan: Follow up with behavioral health clinician on : End of June for final appt Behavioral recommendations: encouragement of grief rxn in a healthy way Referral(s):  New Provider for [facility]  I discussed the assessment and treatment plan with the patient and/or parent/guardian. They were provided an opportunity to ask questions and all were answered. They agreed with the plan and demonstrated an understanding of the instructions.   They were advised to call back or seek an in-person evaluation if the symptoms worsen or if the condition fails to improve as anticipated.  [name], LMFT

## 2022-06-09 NOTE — Telephone Encounter (Signed)
Rx for oxycodone went to [facility]. Patient requested at [place]. Rx cancelled with Barbera Setters at [facility]. [facility]

## 2022-06-10 ENCOUNTER — Ambulatory Visit: Payer: Medicare Other | Admitting: Behavioral Health

## 2022-06-10 ENCOUNTER — Institutional Professional Consult (permissible substitution): Payer: Medicare Other | Admitting: Behavioral Health

## 2022-06-12 ENCOUNTER — Telehealth: Payer: Self-pay | Admitting: Behavioral Health

## 2022-06-12 ENCOUNTER — Institutional Professional Consult (permissible substitution): Payer: Medicare Other | Admitting: Behavioral Health

## 2022-06-12 NOTE — Telephone Encounter (Signed)
Spoke w/Pt briefly. She & Son Georgina Snell are trying to find a Bldg downtown to reserve a Ecologist for [employer]. Pt needs to contact Rep @ the [place] location to get directions.  Pt is "doing better". She is eating some. Will ck-in with Pt again next week.  Dr. Theodis Shove

## 2022-06-17 ENCOUNTER — Telehealth: Payer: Self-pay | Admitting: Behavioral Health

## 2022-06-17 ENCOUNTER — Institutional Professional Consult (permissible substitution): Payer: Medicare Other | Admitting: Behavioral Health

## 2022-06-17 NOTE — Telephone Encounter (Signed)
Unable to reach or lv msg for Pt today re: f/u ck-in on Pt due to recent death of her Son.   Dr. Theodis Shove

## 2022-06-18 ENCOUNTER — Other Ambulatory Visit: Payer: Self-pay

## 2022-06-18 DIAGNOSIS — E119 Type 2 diabetes mellitus without complications: Secondary | ICD-10-CM

## 2022-06-19 MED ORDER — TRESIBA FLEXTOUCH 200 UNIT/ML ~~LOC~~ SOPN: PEN_INJECTOR | SUBCUTANEOUS | 3 refills | Status: DC

## 2022-06-26 ENCOUNTER — Ambulatory Visit (INDEPENDENT_AMBULATORY_CARE_PROVIDER_SITE_OTHER): Payer: Medicare Other | Admitting: Internal Medicine

## 2022-06-26 DIAGNOSIS — F4321 Adjustment disorder with depressed mood: Secondary | ICD-10-CM

## 2022-06-26 DIAGNOSIS — Z634 Disappearance and death of family member: Secondary | ICD-10-CM | POA: Diagnosis not present

## 2022-06-26 NOTE — Patient Instructions (Signed)
Jennifer [name],  There are no words.  My heart goes out to you and your family during this unimaginable time.  When you are ready, I have two resources who can help.  Please reach out if you wish.  [name] [phone]  Hospice ([facility]) counselling (free, even if [place] wasn't a Hospice patient).  [phone]  Please do your best to take care of yourself.  We are here for you.  Dr. Jimmye Norman

## 2022-06-27 DIAGNOSIS — F4321 Adjustment disorder with depressed mood: Secondary | ICD-10-CM | POA: Insufficient documentation

## 2022-06-27 NOTE — Progress Notes (Signed)
Jennifer [name] was scheduled for a routine visit today, though unbeknownst to me her son has very recently passed away due to serious medical conditions and she is grieving.  We spent most of our time visiting and processing her emotions.  She endorses trying her best to take care of herself by taking her medications, eating, and sleeping, but admits that it is difficult.  She has help from her family.  Grief counseling resources through a [facility] care hospice were shared with her and information about establishing counseling with [facility] were also provided now that her behavioral health therapist has left her practice.  BP (!) 147/101 (BP Location: Right Arm, Patient Position: Sitting, Cuff Size: Normal) Comment: patient has not taken any medicines  Pulse 84   Temp 97.7 F (36.5 C) (Oral)   Ht '5\' 5"'$  (1.651 m)   Wt 235 lb 8 oz (106.8 kg)   SpO2 100%   BMI 39.19 kg/m   [name] was tearful throughout her visit.  She has no breathing difficulties.  No active skin lesions of her hands or right foot.  The heavy edema of her right lower extremity is chronic, skin is noted to be dry with no impending loss of skin integrity.  BP (!) 147/101 (BP Location: Right Arm, Patient Position: Sitting, Cuff Size: Normal) Comment: patient has not taken any medicines  Pulse 84   Temp 97.7 F (36.5 C) (Oral)   Ht '5\' 5"'$  (1.651 m)   Wt 235 lb 8 oz (106.8 kg)   SpO2 100%   BMI 39.19 kg/m   BP uncontrolled - stress, grief, hasn't taken medications today.  Grief at loss of child Youngest son has passed away in ICU.  Information for grief counselling resources provided for [facility])  RTC about 4 weeks for routine visit which could not be completed today.

## 2022-06-27 NOTE — Assessment & Plan Note (Signed)
Youngest son has passed away in ICU.  Information for grief counselling resources provided for [facility])

## 2022-07-01 ENCOUNTER — Other Ambulatory Visit (HOSPITAL_COMMUNITY): Payer: Self-pay

## 2022-07-01 ENCOUNTER — Other Ambulatory Visit: Payer: Self-pay | Admitting: *Deleted

## 2022-07-01 DIAGNOSIS — M62838 Other muscle spasm: Secondary | ICD-10-CM

## 2022-07-01 DIAGNOSIS — G8929 Other chronic pain: Secondary | ICD-10-CM

## 2022-07-01 MED ORDER — OXYCODONE HCL 5 MG PO TABS: 5.0000 mg | ORAL_TABLET | Freq: Four times a day (QID) | ORAL | 0 refills | Status: DC | PRN

## 2022-07-01 MED ORDER — BACLOFEN 10 MG PO TABS: 5.0000 mg | ORAL_TABLET | Freq: Two times a day (BID) | ORAL | 0 refills | Status: DC

## 2022-07-01 NOTE — Telephone Encounter (Signed)
No ToxAssure seen.

## 2022-07-05 DIAGNOSIS — J9611 Chronic respiratory failure with hypoxia: Secondary | ICD-10-CM | POA: Diagnosis not present

## 2022-07-05 DIAGNOSIS — J449 Chronic obstructive pulmonary disease, unspecified: Secondary | ICD-10-CM | POA: Diagnosis not present

## 2022-07-05 DIAGNOSIS — Z89512 Acquired absence of left leg below knee: Secondary | ICD-10-CM | POA: Diagnosis not present

## 2022-07-05 DIAGNOSIS — E1159 Type 2 diabetes mellitus with other circulatory complications: Secondary | ICD-10-CM | POA: Diagnosis not present

## 2022-07-05 DIAGNOSIS — J441 Chronic obstructive pulmonary disease with (acute) exacerbation: Secondary | ICD-10-CM | POA: Diagnosis not present

## 2022-07-11 DIAGNOSIS — Z89512 Acquired absence of left leg below knee: Secondary | ICD-10-CM | POA: Diagnosis not present

## 2022-07-18 ENCOUNTER — Telehealth: Payer: Self-pay | Admitting: *Deleted

## 2022-07-18 NOTE — Telephone Encounter (Signed)
RTC to patient at both number question about low sugars.  Message left that the clinics had returned her call and to call if needed.

## 2022-07-22 ENCOUNTER — Ambulatory Visit (INDEPENDENT_AMBULATORY_CARE_PROVIDER_SITE_OTHER): Payer: Medicare Other | Admitting: Student

## 2022-07-22 ENCOUNTER — Encounter: Payer: Self-pay | Admitting: Student

## 2022-07-22 DIAGNOSIS — U071 COVID-19: Secondary | ICD-10-CM

## 2022-07-22 DIAGNOSIS — J069 Acute upper respiratory infection, unspecified: Secondary | ICD-10-CM | POA: Insufficient documentation

## 2022-07-22 HISTORY — DX: COVID-19: U07.1

## 2022-07-22 HISTORY — DX: Acute upper respiratory infection, unspecified: J06.9

## 2022-07-22 MED ORDER — DM-GUAIFENESIN ER 30-600 MG PO TB12: 1.0000 | ORAL_TABLET | Freq: Two times a day (BID) | ORAL | 0 refills | Status: DC

## 2022-07-22 MED ORDER — NIRMATRELVIR/RITONAVIR (PAXLOVID)TABLET: 3.0000 | ORAL_TABLET | Freq: Two times a day (BID) | ORAL | 0 refills | Status: DC

## 2022-07-22 MED ORDER — NIRMATRELVIR/RITONAVIR (PAXLOVID)TABLET: 3.0000 | ORAL_TABLET | Freq: Two times a day (BID) | ORAL | 0 refills | Status: AC

## 2022-07-22 NOTE — Progress Notes (Signed)
CC: Cough and cold symptoms, positive COVID test  This is a telephone encounter between [name] and [name] on 07/22/2022 for evaluation of COVID infection the visit was conducted with the patient located at home and [name] at [facility]. The patient's identity was confirmed using their DOB and current address. The patient has consented to being evaluated through a telephone encounter and understands the associated risks (an examination cannot be done and the patient may need to come in for an appointment) / benefits (allows the patient to remain at home, decreasing exposure to coronavirus). I personally spent 15 minutes on medical discussion.   HPI:  [name] is a 61 y.o. with PMH as below.   Please see A&P for assessment of the patient's acute and chronic medical conditions.   Past Medical History:  Diagnosis Date   Acute vestibular syndrome, resolved 03/02/2021   Angioedema 06/14/2021   Asthma    Burn of finger of right hand, second degree 02/05/2021   Occurred during cooking (frying), poor sensation due to neuropathy, pt punctured blister to allow it to drain, skin has since desquamated over dorsal joint, no infection.  Keep clean and dry, OTC antibacterial ointment.   Cataract    CHF (congestive heart failure) (HCC)    Chronic bronchitis ([place])    "I get it alot" (09/28/2013)   Chronic diastolic heart failure (HCC)    grade 2 per 2D echocardiogram (01/2013)   Chronic kidney disease    Chronic lower back pain    Chronic pain syndrome 12/03/2011   Likely secondary to depression, "fibromyalgia", neuropathy, and obesity. Lumbar MRI 2014 no sig change from prior (2008) : Stable hypertrophic facet disease most notable at L4-5. Stable shallow left foraminal/extraforaminal disc protrusion at L4-5. No direct neural compression.       COPD 01/08/2007   PFT's 05/2007 : FEV1/FVC 82, FEV1 64% pred, FEF 25-75% 40% predicted, 16% improvement in FEV1 with  bronchodilators.      Depression    Diabetic peripheral neuropathy (HCC)    Dizziness, resolved (admitted with vestibular migraine)    DVT of upper extremity (deep vein thrombosis) ([place]) 03/11/2013   Secondary to PICC line. Right brachial vein, diagnosed on 03/10/2013 Coumadin for 3 months. End date 06/10/2013    Environmental allergies    Hx: of   Fatty liver 2003   observed on ultrasound abdomen   Fibromyalgia    GERD (gastroesophageal reflux disease)    Glaucoma    Headache    History of bacterial endocarditis 2014   Endocarditis involving mitral and tricuspid valves.  S. Aureus and GBS.    History of use of hearing aid    Hyperlipidemia    Hyperplastic colon polyp 12/2010   Per colonoscopy (12/2010) - Dr. 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  Evaluated on 07/08/2021 by podiatrist Dr. Amalia Hailey who debrided necrotic tissue and prescribed doxycycline.  He will see her again in 3 weeks.  The location of this ulcer on the dorsal aspect of the toe is somewhat atypical for a purely diabetic foot wound, and she does have a strong DP pulse.  I did not examine h   Type II diabetes mellitus with peripheral circulatory disorders, uncontrolled DX: 1993   Insulin dep. Poor control. Complicated by diabetic foot ulcer and diabetic eye disease.     Review of  Systems: Positive for productive cough with grayish sputum, chills, shortness of breath, sore throat, nausea, generalized weakness.  Negative for chest pain, palpitations, vomiting, abdominal pain, wheezing, loss of taste or smell.   Assessment & Plan:   See Encounters Tab for problem based charting.  Patient discussed with Dr. Angelia Mould  Linwood Dibbles, MD, MPH

## 2022-07-22 NOTE — Assessment & Plan Note (Signed)
Jennifer [name] was evaluated via telephone encounter today after testing positive for the COVID virus earlier today. Patient states her sister and 63-year-old niece whom she lives with had cold and cough symptoms over the weekend. She had a mild cough by the end of the weekend however yesterday, she started having worsening upper respiratory tract symptoms.  He has had a productive cough with clear sputum, sore throat, headache, chills and generalized malaise. She has also had mild shortness of breath but denies any wheezing, fevers, chest pain, palpitations, abdominal pain, nausea, vomiting, loss of smell or taste. All 3 were tested with a COVID 19 home testing kit and they were all positive for COVID infection. She has been using her as needed albuterol inhaler a few times the past 2 days as well as over-the-counter cough and cold medicine. She has been vaccinated against COVID-19. Due to patient's comorbidities and moderate COVID-19 symptoms, she has a high risk of progressing to severe disease and so will benefit from starting a course of Paxlovid. Plan to follow-up with patient later this week to re-evaluate his symptoms. Patient states she has difficult time making her appointment because she relies on public transportation.   Plan: -Start Paxlovid (reduced dose based on GFR 58) 3 tablets twice daily for 5  Days -Start Mucinex, 1 tablet twice daily -Continue as needed albuterol inhaler -Instructed to stay well hydrated -Follow-up on Friday for reassessment of symptoms

## 2022-07-22 NOTE — Patient Instructions (Signed)
Thank you, Ms.Chancey L Wegner for allowing [place] to provide your care today. Today we discussed your recent COVID-19 infection and your upper respiratory tract symptoms.  Please make sure to take all the medications below and stay well-hydrated.  I will follow-up with you later this week to check how you are doing.   I have ordered the following medication/changed the following medications:  Start Paxlovid 3 tablets twice daily for 5 days Start Mucinex DM 1 tablet twice daily  My Chart Access: [email or web address]  Please follow-up in 1 week  Please make sure to arrive 15 minutes prior to your next appointment. If you arrive late, you may be asked to reschedule.    We look forward to seeing you next time. Please call our clinic at [phone] if you have any questions or concerns. The best time to call is Monday-Friday from 9am-4pm, but there is someone available 24/7. If after hours or the weekend, call the main hospital number and ask for the Internal Medicine Resident On-Call. If you need medication refills, please notify your pharmacy one week in advance and they will send [place] a request.   Thank you for letting us take part in your care. Wishing you the best!  [name], MD 07/22/2022, 10:38 PM IM Resident, PGY-3 [place]aldo Milian 41:10

## 2022-07-24 NOTE — Progress Notes (Signed)
Internal Medicine Clinic Attending  Case discussed with the resident at the time of the visit.  We reviewed the resident's history and exam and pertinent patient test results.  I agree with the assessment, diagnosis, and plan of care documented in the resident's note.  

## 2022-08-05 ENCOUNTER — Telehealth: Payer: Self-pay | Admitting: Dietician

## 2022-08-05 DIAGNOSIS — Z89512 Acquired absence of left leg below knee: Secondary | ICD-10-CM | POA: Diagnosis not present

## 2022-08-05 DIAGNOSIS — J9611 Chronic respiratory failure with hypoxia: Secondary | ICD-10-CM | POA: Diagnosis not present

## 2022-08-05 DIAGNOSIS — J441 Chronic obstructive pulmonary disease with (acute) exacerbation: Secondary | ICD-10-CM | POA: Diagnosis not present

## 2022-08-05 DIAGNOSIS — J449 Chronic obstructive pulmonary disease, unspecified: Secondary | ICD-10-CM | POA: Diagnosis not present

## 2022-08-05 DIAGNOSIS — E1159 Type 2 diabetes mellitus with other circulatory complications: Secondary | ICD-10-CM | POA: Diagnosis not present

## 2022-08-05 NOTE — Telephone Encounter (Signed)
Patient due for follow up visit per Dr. Coy Saunas. He prefers in person but can be virtual if needed. Message left for patient to call office to schedule.

## 2022-08-07 ENCOUNTER — Other Ambulatory Visit: Payer: Self-pay

## 2022-08-07 DIAGNOSIS — G8929 Other chronic pain: Secondary | ICD-10-CM

## 2022-08-08 ENCOUNTER — Other Ambulatory Visit: Payer: Self-pay | Admitting: *Deleted

## 2022-08-08 ENCOUNTER — Telehealth: Payer: Self-pay | Admitting: Internal Medicine

## 2022-08-08 MED ORDER — OXYCODONE HCL 5 MG PO TABS: 5.0000 mg | ORAL_TABLET | Freq: Four times a day (QID) | ORAL | 0 refills | Status: DC | PRN

## 2022-08-08 NOTE — Telephone Encounter (Signed)
Oxycodone was refilled today - called pt but no answer. Left message on self identified vm to call the pharmacy.

## 2022-08-08 NOTE — Telephone Encounter (Signed)
Refill Request  Pt states she will be out of her medication listed below:  oxyCODONE (OXY IR/ROXICODONE) 5 MG immediate release tablet  [id number] [place], [place] - [address]

## 2022-08-20 ENCOUNTER — Other Ambulatory Visit: Payer: Self-pay | Admitting: *Deleted

## 2022-08-20 NOTE — Telephone Encounter (Signed)
Patient requesting Easy Touch TWI 28G Lancets # 100

## 2022-08-27 ENCOUNTER — Other Ambulatory Visit: Payer: Self-pay | Admitting: Internal Medicine

## 2022-08-27 ENCOUNTER — Other Ambulatory Visit: Payer: Self-pay

## 2022-08-27 ENCOUNTER — Telehealth: Payer: Self-pay | Admitting: Dietician

## 2022-08-27 DIAGNOSIS — E118 Type 2 diabetes mellitus with unspecified complications: Secondary | ICD-10-CM

## 2022-08-27 DIAGNOSIS — M62838 Other muscle spasm: Secondary | ICD-10-CM

## 2022-08-27 MED ORDER — ALBUTEROL SULFATE HFA 108 (90 BASE) MCG/ACT IN AERS
2.0000 | INHALATION_SPRAY | Freq: Four times a day (QID) | RESPIRATORY_TRACT | 5 refills | Status: DC | PRN
Start: 2022-08-27 — End: 2022-08-27

## 2022-08-27 MED ORDER — LANCETS MISC: 100.0000 | Freq: Three times a day (TID) | 5 refills | Status: DC | PRN

## 2022-08-27 MED ORDER — BACLOFEN 10 MG PO TABS: 5.0000 mg | ORAL_TABLET | Freq: Two times a day (BID) | ORAL | 0 refills | Status: DC

## 2022-08-27 MED ORDER — ALBUTEROL SULFATE HFA 108 (90 BASE) MCG/ACT IN AERS
INHALATION_SPRAY | RESPIRATORY_TRACT | 3 refills | Status: DC
Start: 2022-08-27 — End: 2022-10-27

## 2022-08-27 NOTE — Telephone Encounter (Signed)
Received chat message from front desk that patient needs number to Continuous glucose monitor company. Returned call and lft message with phone number for [employer] customer support. Will also mail it to her and suggest she put them into her cell phone contacts

## 2022-08-29 ENCOUNTER — Other Ambulatory Visit: Payer: Self-pay

## 2022-08-29 DIAGNOSIS — E1169 Type 2 diabetes mellitus with other specified complication: Secondary | ICD-10-CM

## 2022-09-01 ENCOUNTER — Ambulatory Visit (INDEPENDENT_AMBULATORY_CARE_PROVIDER_SITE_OTHER): Payer: Medicare Other | Admitting: Internal Medicine

## 2022-09-01 ENCOUNTER — Ambulatory Visit (HOSPITAL_COMMUNITY)
Admission: RE | Admit: 2022-09-01 | Discharge: 2022-09-01 | Disposition: A | Discharge status: Home / Self Care | Payer: Medicare Other | Source: Ambulatory Visit | Attending: Internal Medicine | Admitting: Internal Medicine

## 2022-09-01 ENCOUNTER — Encounter: Payer: Self-pay | Admitting: Internal Medicine

## 2022-09-01 VITALS — BP 146/71 | HR 77 | Temp 98.2°F | Wt 236.0 lb

## 2022-09-01 DIAGNOSIS — L03115 Cellulitis of right lower limb: Secondary | ICD-10-CM | POA: Diagnosis not present

## 2022-09-01 DIAGNOSIS — E1151 Type 2 diabetes mellitus with diabetic peripheral angiopathy without gangrene: Secondary | ICD-10-CM

## 2022-09-01 DIAGNOSIS — I10 Essential (primary) hypertension: Secondary | ICD-10-CM

## 2022-09-01 DIAGNOSIS — E118 Type 2 diabetes mellitus with unspecified complications: Secondary | ICD-10-CM

## 2022-09-01 DIAGNOSIS — Z7984 Long term (current) use of oral hypoglycemic drugs: Secondary | ICD-10-CM

## 2022-09-01 DIAGNOSIS — F17210 Nicotine dependence, cigarettes, uncomplicated: Secondary | ICD-10-CM

## 2022-09-01 DIAGNOSIS — I872 Venous insufficiency (chronic) (peripheral): Secondary | ICD-10-CM

## 2022-09-01 DIAGNOSIS — Z794 Long term (current) use of insulin: Secondary | ICD-10-CM

## 2022-09-01 DIAGNOSIS — E119 Type 2 diabetes mellitus without complications: Secondary | ICD-10-CM

## 2022-09-01 LAB — POCT GLYCOSYLATED HEMOGLOBIN (HGB A1C): Hemoglobin A1C: 7.1 % — AB (ref 4.0–5.6)

## 2022-09-01 LAB — GLUCOSE, CAPILLARY: Glucose-Capillary: 162 mg/dL — ABNORMAL HIGH (ref 70–99)

## 2022-09-01 MED ORDER — OZEMPIC (1 MG/DOSE) 4 MG/3ML ~~LOC~~ SOPN: 1.0000 mg | PEN_INJECTOR | SUBCUTANEOUS | 12 refills | Status: DC

## 2022-09-01 MED ORDER — TRESIBA FLEXTOUCH 200 UNIT/ML ~~LOC~~ SOPN: PEN_INJECTOR | SUBCUTANEOUS | 3 refills | Status: DC

## 2022-09-01 MED ORDER — ROSUVASTATIN CALCIUM 20 MG PO TABS: 20.0000 mg | ORAL_TABLET | Freq: Every morning | ORAL | 3 refills | Status: DC

## 2022-09-01 MED ORDER — AMLODIPINE BESYLATE 10 MG PO TABS: 10.0000 mg | ORAL_TABLET | Freq: Every day | ORAL | 3 refills | Status: DC

## 2022-09-01 MED ORDER — CEPHALEXIN 500 MG PO CAPS: 500.0000 mg | ORAL_CAPSULE | Freq: Two times a day (BID) | ORAL | 0 refills | Status: AC

## 2022-09-01 NOTE — Patient Instructions (Addendum)
[name], it was a pleasure seeing you today! You endorsed feeling well today. Below are some of the things we talked about this visit. We look forward to seeing you in the follow up appointment!  Today we discussed: For your leg swelling; We did an ultrasound that did not show clot in your legs. There are signs of infection so we will give you antibiotics and have you complete them and follow up in one week.  We will also get some lab work.  Try to raise your leg up as much as you can. Take lasix 40 mg every day as it will decrease your swelling.  For your diabetes: you are doing a great job. We will refill your medications For your blood pressure: please keep a log and bring to your appointment with Dr. Jimmye Norman so we can make changes as needed.  For your hands, try to wear gloves as your sensation is reduced and you can burn them without knowing.   I have ordered the following labs today:   Lab Orders         Glucose, capillary         BMP8+Anion Gap         CBC with Diff         POC Hbg A1C       Referrals ordered today:   Referral Orders  No referral(s) requested today     I have ordered the following medication/changed the following medications:   Stop the following medications: There are no discontinued medications.   Start the following medications: No orders of the defined types were placed in this encounter.    Follow-up: One week follow up with Dr. Humphrey Rolls  Please make sure to arrive 15 minutes prior to your next appointment. If you arrive late, you may be asked to reschedule.   We look forward to seeing you next time. Please call our clinic at [phone] if you have any questions or concerns. The best time to call is Monday-Friday from 9am-4pm, but there is someone available 24/7. If after hours or the weekend, call the main hospital number and ask for the Internal Medicine Resident On-Call. If you need medication refills, please notify your pharmacy one  week in advance and they will send [place] a request.  Thank you for letting us take part in your care. Wishing you the best!  Thank you, Jennifer Schuller, MD

## 2022-09-01 NOTE — Progress Notes (Signed)
VASCULAR LAB    Right lower extremity venous duplex has been performed.  See CV proc for preliminary results.  Messaged negative results to Dr. Humphrey Rolls via secure chat. Returning patient to clinic.   Soundra Lampley, RVT 09/01/2022, 11:16 AM

## 2022-09-01 NOTE — Progress Notes (Unsigned)
CC: leg pain and swelling  HPI:  [name] is a 61 y.o. with medical history of HTN, HLD, DMII c/b neuropathy and retinopathy, chronic venous insufficiency, and polypharmacy presenting to [facility] for complaint of rash and infected finger.   Please see problem-based list for further details, assessments, and plans.  Past Medical History:  Diagnosis Date   Acute vestibular syndrome, resolved 03/02/2021   Angioedema 06/14/2021   Asthma    Burn of finger of right hand, second degree 02/05/2021   Occurred during cooking (frying), poor sensation due to neuropathy, pt punctured blister to allow it to drain, skin has since desquamated over dorsal joint, no infection.  Keep clean and dry, OTC antibacterial ointment.   Cataract    CHF (congestive heart failure) (HCC)    Chronic bronchitis ([place])    "I get it alot" (09/28/2013)   Chronic diastolic heart failure (HCC)    grade 2 per 2D echocardiogram (01/2013)   Chronic kidney disease    Chronic lower back pain    Chronic pain syndrome 12/03/2011   Likely secondary to depression, "fibromyalgia", neuropathy, and obesity. Lumbar MRI 2014 no sig change from prior (2008) : Stable hypertrophic facet disease most notable at L4-5. Stable shallow left foraminal/extraforaminal disc protrusion at L4-5. No direct neural compression.       COPD 01/08/2007   PFT's 05/2007 : FEV1/FVC 82, FEV1 64% pred, FEF 25-75% 40% predicted, 16% improvement in FEV1 with bronchodilators.      Depression    Diabetic peripheral neuropathy (HCC)    Dizziness, resolved (admitted with vestibular migraine)    DVT of upper extremity (deep vein thrombosis) ([place]) 03/11/2013   Secondary to PICC line. Right brachial vein, diagnosed on 03/10/2013 Coumadin for 3 months. 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 Aureus and GBS.    History of use of hearing aid    Hyperlipidemia    Hyperplastic colon polyp 12/2010   Per colonoscopy (12/2010) - Dr. 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  Evaluated on 07/08/2021 by podiatrist Dr. Amalia Hailey who debrided necrotic tissue and prescribed doxycycline.  He will see her again in 3 weeks.  The location of this ulcer on the dorsal aspect of the toe is somewhat atypical for a purely diabetic foot wound, and she does have a strong DP pulse.  I did not examine h   Type II diabetes mellitus with peripheral circulatory disorders, uncontrolled DX: 1993   Insulin dep. Poor control. Complicated by  diabetic foot ulcer and diabetic eye disease.      Current Outpatient Medications (Endocrine & Metabolic):    insulin degludec (TRESIBA FLEXTOUCH) 200 UNIT/ML FlexTouch Pen, INJECT 70 UNITS UNDER THE SKIN DAILY   metFORMIN (GLUCOPHAGE) 500 MG tablet, Take 1 tablet (500 mg total) by mouth 2 (two) times daily.   Semaglutide, 1 MG/DOSE, (OZEMPIC, 1 MG/DOSE,) 4 MG/3ML SOPN, Inject 1 mg into the skin once a week.  Current Outpatient Medications (Cardiovascular):    amLODipine (NORVASC) 10 MG tablet, Take 1 tablet (10 mg total) by mouth daily.   furosemide (LASIX)  20 MG tablet, Take 40 mg by mouth daily as needed for edema.   hydrochlorothiazide (HYDRODIURIL) 25 MG tablet, Take 25 mg by mouth daily.   metoprolol succinate (TOPROL-XL) 100 MG 24 hr tablet, TAKE 1 TABLET BY MOUTH DAILY. TAKE WITH OR IMMEDIATELY FOLLOWING A MEAL.   rosuvastatin (CRESTOR) 20 MG tablet, Take 1 tablet (20 mg total) by mouth in the morning.  Current Outpatient Medications (Respiratory):    albuterol (PROAIR HFA) 108 (90 Base) MCG/ACT inhaler, INHALE 2 PUFFS BY MOUTH EVERY 6 HOURS AS NEEDED FOR WHEEZING   dextromethorphan-guaiFENesin (MUCINEX DM) 30-600 MG 12hr tablet, Take 1 tablet by mouth 2 (two) times daily.   diphenhydrAMINE (BENADRYL) 25 MG tablet, Take 50 mg by mouth every 6 (six) hours as needed for itching.   umeclidinium-vilanterol (ANORO ELLIPTA) 62.5-25 MCG/INH AEPB, Inhale 1 puff into the lungs in the morning.  Current Outpatient Medications (Analgesics):    acetaminophen (TYLENOL) 650 MG CR tablet, Take 1,300 mg by mouth every 8 (eight) hours as needed for pain.   aspirin EC 81 MG tablet, Take 1 tablet (81 mg total) by mouth daily.   oxyCODONE (OXY IR/ROXICODONE) 5 MG immediate release tablet, Take 1 tablet (5 mg total) by mouth every 6 (six) hours as needed for severe pain.   Current Outpatient Medications (Other):    cephALEXin (KEFLEX) 500 MG capsule, Take 1 capsule (500 mg total) by mouth 2 (two) times daily for 7 days.   baclofen (LIORESAL) 10 MG tablet, Take 0.5 tablets (5 mg total) by mouth 2 (two) times daily.   Blood Glucose Monitoring Suppl ([place]) w/Device KIT, Check 4 times a day   Continuous Blood Gluc Sensor (FREESTYLE LIBRE 3 SENSOR) MISC, 1 each by Does not apply route 6 (six) times daily. Place 1 sensor on the skin every 14 days. Use to check glucose continuously   diclofenac Sodium (VOLTAREN) 1 % GEL, Apply 2 g topically 4 (four) times daily. Use on the middle finger of the right hand. Do not use on any wounds.   DULoxetine  (CYMBALTA) 30 MG capsule, Take 1 capsule (30 mg total) by mouth 2 (two) times daily.   glucose blood (ONETOUCH VERIO) test strip, 1 each by Other route as needed for other. Use as instructed. Tests 3-4 times daily.   Insulin Pen Needle 32G X 4 MM MISC, Use to inject insulin 4 times a day and semaglutide once weekly   Lancets MISC, 100 Lancets by Does not apply route 3 (three) times daily as needed.   loperamide (IMODIUM A-D) 2 MG tablet, Take 4-8 mg by mouth as needed for diarrhea or loose stools.   Menthol-Methyl Salicylate (SALONPAS PAIN RELIEF PATCH) PTCH, Apply 4-6 patches topically daily as needed (pain).   MYRBETRIQ 50 MG TB24 tablet, Take 1 tablet (50 mg total) by mouth daily. (Patient not taking: Reported on 05/05/2022)   nystatin powder,  Apply 1 application  topically 3 (three) times daily as needed.   OVER THE COUNTER MEDICATION, Take 1 tablet by mouth daily. Brain Health otc supplement   oxybutynin (DITROPAN-XL) 10 MG 24 hr tablet, Take 10 mg by mouth at bedtime.   pregabalin (LYRICA) 200 MG capsule, TAKE 1 CAPSULE BY MOUTH THREE TIMES A DAY   traZODone (DESYREL) 100 MG tablet, Take 2 tablets (200 mg total) by mouth at bedtime as needed for sleep.   Vibegron (GEMTESA) 75 MG TABS, Take 75 mg by mouth daily.  Review of Systems:  Review of system negative unless stated in the problem list or HPI.    Physical Exam:  Vitals:   09/01/22 0837  BP: (!) 146/71  Pulse: 77  Temp: 98.2 F (36.8 C)  TempSrc: Oral  SpO2: 97%  Weight: 236 lb (107 kg)    Physical Exam General: NAD HENT: NCAT Lungs: CTAB, no wheeze, rhonchi or rales.  Cardiovascular: Normal heart sounds, no r/m/g, 2+ pulses in all extremities. No LE edema Abdomen: No TTP, normal bowel sounds MSK: No asymmetry or muscle atrophy.  Skin: no lesions noted on exposed skin Neuro: Alert and oriented x4. CN grossly intact Psych: Normal mood and normal affect   Assessment & Plan:   Cellulitis of right leg Appears  multifactorial but most concerning is DVT and cellulitis. The swelling is chronic but worse seen Wednesday 09/06. States it is getting worse. Pain is sharp. Exam shows diffuse tenderness. Patient does endorse she doesn't like to take lasix. Patient was sent for an urgent ultrasound to rule out DVT due to sharp pain and significant swelling given her left AKA resulting in decreased ambulation predisposing her to an DVT. The doppler was negative for DVT but showed a reactive lymph node which is consistent with cellulitis. It appears the cellulitis is in the beginning phases as the erythema is mild but it is well demarcated. Given this finding, will initiate Keflex 500 mg BID for 7 days and re-evaluate next week.  -Start abx as above.  -Follow up in 1 week.   Diabetes mellitus type 2, controlled, with complications ([place]) Has DMII. On Insulin 70 units daily, Metformin 500 mg BID, Ozempic 1 mg weekly, Lyrica 200 mg TID for her neuropathy. A1c 7.4 in 04/2022 and repeated this visit and improved to 7.1. -Will continue current regimen.  -Follow up with PCP at the end of the month.    See Encounters Tab for problem based charting.  Patient discussed with Dr. Letha Cape, MD Tillie Rung. [facility] Internal Medicine Residency, PGY-2

## 2022-09-02 DIAGNOSIS — L03115 Cellulitis of right lower limb: Secondary | ICD-10-CM | POA: Insufficient documentation

## 2022-09-02 LAB — BMP8+ANION GAP
Anion Gap: 17 mmol/L (ref 10.0–18.0)
BUN/Creatinine Ratio: 14 (ref 12–28)
BUN: 11 mg/dL (ref 8–27)
CO2: 22 mmol/L (ref 20–29)
Calcium: 9.1 mg/dL (ref 8.7–10.3)
Chloride: 106 mmol/L (ref 96–106)
Creatinine, Ser: 0.76 mg/dL (ref 0.57–1.00)
Glucose: 162 mg/dL — ABNORMAL HIGH (ref 70–99)
Potassium: 4.4 mmol/L (ref 3.5–5.2)
Sodium: 145 mmol/L — ABNORMAL HIGH (ref 134–144)
eGFR: 88 mL/min/{1.73_m2} (ref >59)

## 2022-09-02 LAB — CBC WITH DIFFERENTIAL/PLATELET
Basophils Absolute: 0.1 10*3/uL (ref 0.0–0.2)
Basos: 1 %
EOS (ABSOLUTE): 0.1 10*3/uL (ref 0.0–0.4)
Eos: 1 %
Hematocrit: 38.5 % (ref 34.0–46.6)
Hemoglobin: 12.2 g/dL (ref 11.1–15.9)
Immature Grans (Abs): 0 10*3/uL (ref 0.0–0.1)
Immature Granulocytes: 0 %
Lymphocytes Absolute: 2.7 10*3/uL (ref 0.7–3.1)
Lymphs: 38 %
MCH: 29.4 pg (ref 26.6–33.0)
MCHC: 31.7 g/dL (ref 31.5–35.7)
MCV: 93 fL (ref 79–97)
Monocytes Absolute: 0.4 10*3/uL (ref 0.1–0.9)
Monocytes: 5 %
Neutrophils Absolute: 3.9 10*3/uL (ref 1.4–7.0)
Neutrophils: 55 %
Platelets: 175 10*3/uL (ref 150–450)
RBC: 4.15 x10E6/uL (ref 3.77–5.28)
RDW: 14.4 % (ref 11.7–15.4)
WBC: 7.2 10*3/uL (ref 3.4–10.8)

## 2022-09-02 NOTE — Assessment & Plan Note (Signed)
Appears multifactorial but most concerning is DVT and cellulitis. The swelling is chronic but worse seen Wednesday 09/06. States it is getting worse. Pain is sharp. Exam shows diffuse tenderness. Patient does endorse she doesn't like to take lasix. Patient was sent for an urgent ultrasound to rule out DVT due to sharp pain and significant swelling given her left AKA resulting in decreased ambulation predisposing her to an DVT. The doppler was negative for DVT but showed a reactive lymph node which is consistent with cellulitis. It appears the cellulitis is in the beginning phases as the erythema is mild but it is well demarcated. Given this finding, will initiate Keflex 500 mg BID for 7 days and re-evaluate next week.  -Start abx as above.  -Follow up in 1 week.

## 2022-09-02 NOTE — Assessment & Plan Note (Signed)
Has DMII. On Insulin 70 units daily, Metformin 500 mg BID, Ozempic 1 mg weekly, Lyrica 200 mg TID for her neuropathy. A1c 7.4 in 04/2022 and repeated this visit and improved to 7.1. -Will continue current regimen.  -Follow up with PCP at the end of the month.

## 2022-09-02 NOTE — Progress Notes (Signed)
Internal Medicine Clinic Attending  I saw and evaluated the patient.  I personally confirmed the key portions of the history and exam documented by Dr. Humphrey Rolls and I reviewed pertinent patient test results.  The assessment, diagnosis, and plan were formulated together and I agree with the documentation in the resident's note. I'll f/u on Jennifer [name]'s chronic problems at our upcoming visit.

## 2022-09-03 ENCOUNTER — Encounter: Payer: Self-pay | Admitting: Internal Medicine

## 2022-09-05 DIAGNOSIS — J441 Chronic obstructive pulmonary disease with (acute) exacerbation: Secondary | ICD-10-CM | POA: Diagnosis not present

## 2022-09-05 DIAGNOSIS — J449 Chronic obstructive pulmonary disease, unspecified: Secondary | ICD-10-CM | POA: Diagnosis not present

## 2022-09-05 DIAGNOSIS — Z89512 Acquired absence of left leg below knee: Secondary | ICD-10-CM | POA: Diagnosis not present

## 2022-09-05 DIAGNOSIS — E1159 Type 2 diabetes mellitus with other circulatory complications: Secondary | ICD-10-CM | POA: Diagnosis not present

## 2022-09-05 DIAGNOSIS — J9611 Chronic respiratory failure with hypoxia: Secondary | ICD-10-CM | POA: Diagnosis not present

## 2022-09-08 ENCOUNTER — Other Ambulatory Visit: Payer: Self-pay | Admitting: *Deleted

## 2022-09-08 DIAGNOSIS — G8929 Other chronic pain: Secondary | ICD-10-CM

## 2022-09-08 MED ORDER — OXYCODONE HCL 5 MG PO TABS: 5.0000 mg | ORAL_TABLET | Freq: Four times a day (QID) | ORAL | 0 refills | Status: DC | PRN

## 2022-09-08 NOTE — Telephone Encounter (Signed)
Patient called in requesting refill on oxycodone. States she is completely out.

## 2022-09-09 ENCOUNTER — Other Ambulatory Visit: Payer: Self-pay

## 2022-09-09 DIAGNOSIS — M62838 Other muscle spasm: Secondary | ICD-10-CM

## 2022-09-09 NOTE — Telephone Encounter (Signed)
Left detailed message on patient's self-identified VM that Rx was sent to [facility] yesterday at 5:50 PM.

## 2022-09-10 ENCOUNTER — Encounter: Payer: Self-pay | Admitting: Internal Medicine

## 2022-09-10 ENCOUNTER — Encounter: Payer: Self-pay | Admitting: Dietician

## 2022-09-10 ENCOUNTER — Other Ambulatory Visit: Payer: Self-pay

## 2022-09-10 ENCOUNTER — Ambulatory Visit (INDEPENDENT_AMBULATORY_CARE_PROVIDER_SITE_OTHER): Payer: Medicare Other | Admitting: Internal Medicine

## 2022-09-10 VITALS — BP 135/71 | HR 64 | Temp 98.2°F | Ht 65.0 in | Wt 234.4 lb

## 2022-09-10 DIAGNOSIS — L03115 Cellulitis of right lower limb: Secondary | ICD-10-CM | POA: Diagnosis not present

## 2022-09-10 DIAGNOSIS — M7541 Impingement syndrome of right shoulder: Secondary | ICD-10-CM | POA: Diagnosis not present

## 2022-09-10 DIAGNOSIS — F17210 Nicotine dependence, cigarettes, uncomplicated: Secondary | ICD-10-CM

## 2022-09-10 DIAGNOSIS — Z23 Encounter for immunization: Secondary | ICD-10-CM

## 2022-09-10 DIAGNOSIS — T300 Burn of unspecified body region, unspecified degree: Secondary | ICD-10-CM

## 2022-09-10 DIAGNOSIS — F17200 Nicotine dependence, unspecified, uncomplicated: Secondary | ICD-10-CM

## 2022-09-10 MED ORDER — NICOTINE 21 MG/24HR TD PT24: 21.0000 mg | MEDICATED_PATCH | Freq: Every day | TRANSDERMAL | 0 refills | Status: DC

## 2022-09-10 MED ORDER — TRIPLE ANTIBIOTIC 3.5-400-5000 EX OINT: 1.0000 | TOPICAL_OINTMENT | Freq: Two times a day (BID) | CUTANEOUS | 0 refills | Status: AC | PRN

## 2022-09-10 NOTE — Progress Notes (Unsigned)
CC: follow up  HPI:  Jennifer [name] is a 61 y.o. with medical history of HTN, HLD, DMII c/b neuropathy and retinopathy, chronic venous insufficiency, and polypharmacy presenting to [facility] for follow up leg pain.   Please see problem-based list for further details, assessments, and plans.  Past Medical History:  Diagnosis Date   Acute vestibular syndrome, resolved 03/02/2021   Angioedema 06/14/2021   Asthma    Burn of finger of right hand, second degree 02/05/2021   Occurred during cooking (frying), poor sensation due to neuropathy, pt punctured blister to allow it to drain, skin has since desquamated over dorsal joint, no infection.  Keep clean and dry, OTC antibacterial ointment.   Cataract    CHF (congestive heart failure) (HCC)    Chronic bronchitis ([place])    "I get it alot" (09/28/2013)   Chronic diastolic heart failure (HCC)    grade 2 per 2D echocardiogram (01/2013)   Chronic kidney disease    Chronic lower back pain    Chronic pain syndrome 12/03/2011   Likely secondary to depression, "fibromyalgia", neuropathy, and obesity. Lumbar MRI 2014 no sig change from prior (2008) : Stable hypertrophic facet disease most notable at L4-5. Stable shallow left foraminal/extraforaminal disc protrusion at L4-5. No direct neural compression.       COPD 01/08/2007   PFT's 05/2007 : FEV1/FVC 82, FEV1 64% pred, FEF 25-75% 40% predicted, 16% improvement in FEV1 with bronchodilators.      Depression    Diabetic peripheral neuropathy (HCC)    Dizziness, resolved (admitted with vestibular migraine)    DVT of upper extremity (deep vein thrombosis) ([place]) 03/11/2013   Secondary to PICC line. Right brachial vein, diagnosed on 03/10/2013 Coumadin for 3 months. End date 06/10/2013    Environmental allergies    Hx: of   Fatty liver 2003   observed on ultrasound abdomen   Fibromyalgia    GERD (gastroesophageal reflux disease)    Glaucoma    Headache    History of bacterial endocarditis 2014    Endocarditis involving mitral and tricuspid valves.  S. Aureus and GBS.    History of use of hearing aid    Hyperlipidemia    Hyperplastic colon polyp 12/2010   Per colonoscopy (12/2010) - Dr. 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)    Diagnosed age 52; treated initially with "lots of aspirin"   Pneumonia    PVD (peripheral vascular disease) with claudication ([place])    Stents to bilateral common iliac arteries (left 2005, right 2008), on chronic plavix   Pyelonephritis 10/28/2020   S/P BKA (below knee amputation) unilateral ([place])    2014 L - failed limb preserving treatment. 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  Evaluated on 07/08/2021 by podiatrist Dr. Amalia Hailey who debrided necrotic tissue and prescribed doxycycline.  He will see her again in 3 weeks.  The location of this ulcer on the dorsal aspect of the toe is somewhat atypical for a purely diabetic foot wound, and she does have a strong DP pulse.  I did not examine h   Type II diabetes mellitus with peripheral circulatory disorders, uncontrolled DX: 1993   Insulin dep. Poor control. Complicated by diabetic foot ulcer and  diabetic eye disease.      Current Outpatient Medications (Endocrine & Metabolic):    insulin degludec (TRESIBA FLEXTOUCH) 200 UNIT/ML FlexTouch Pen, INJECT 70 UNITS UNDER THE SKIN DAILY   metFORMIN (GLUCOPHAGE) 500 MG tablet, Take 1 tablet (500 mg total) by mouth 2 (two) times daily.   Semaglutide, 1 MG/DOSE, (OZEMPIC, 1 MG/DOSE,) 4 MG/3ML SOPN, Inject 1 mg into the skin once a week.  Current Outpatient Medications (Cardiovascular):    amLODipine (NORVASC) 10 MG tablet, Take 1 tablet (10 mg total) by mouth daily.   furosemide (LASIX) 20 MG tablet, Take 40 mg by  mouth daily as needed for edema.   hydrochlorothiazide (HYDRODIURIL) 25 MG tablet, Take 25 mg by mouth daily.   metoprolol succinate (TOPROL-XL) 100 MG 24 hr tablet, Take 1 tablet (100 mg total) by mouth daily. TAKE WITH OR IMMEDIATELY FOLLOWING A MEAL.   rosuvastatin (CRESTOR) 20 MG tablet, Take 1 tablet (20 mg total) by mouth in the morning.  Current Outpatient Medications (Respiratory):    albuterol (PROAIR HFA) 108 (90 Base) MCG/ACT inhaler, INHALE 2 PUFFS BY MOUTH EVERY 6 HOURS AS NEEDED FOR WHEEZING   dextromethorphan-guaiFENesin (MUCINEX DM) 30-600 MG 12hr tablet, Take 1 tablet by mouth 2 (two) times daily.   diphenhydrAMINE (BENADRYL) 25 MG tablet, Take 50 mg by mouth every 6 (six) hours as needed for itching.   umeclidinium-vilanterol (ANORO ELLIPTA) 62.5-25 MCG/INH AEPB, Inhale 1 puff into the lungs in the morning.  Current Outpatient Medications (Analgesics):    acetaminophen (TYLENOL) 650 MG CR tablet, Take 1,300 mg by mouth every 8 (eight) hours as needed for pain.   aspirin EC 81 MG tablet, Take 1 tablet (81 mg total) by mouth daily.   oxyCODONE (OXY IR/ROXICODONE) 5 MG immediate release tablet, Take 1 tablet (5 mg total) by mouth every 6 (six) hours as needed for severe pain.   Current Outpatient Medications (Other):    neomycin-bacitracin-polymyxin 3.5-506 [id number] OINT, Apply 1 Application topically 2 (two) times daily as needed.   nicotine (NICODERM CQ - DOSED IN MG/24 HOURS) 21 mg/24hr patch, Place 1 patch (21 mg total) onto the skin daily.   baclofen (LIORESAL) 10 MG tablet, Take 0.5 tablets (5 mg total) by mouth 2 (two) times daily.   Blood Glucose Monitoring Suppl ([place]) w/Device KIT, Check 4 times a day   Continuous Blood Gluc Sensor (FREESTYLE LIBRE 3 SENSOR) MISC, 1 each by Does not apply route 6 (six) times daily. Place 1 sensor on the skin every 14 days. Use to check glucose continuously   diclofenac Sodium (VOLTAREN) 1 % GEL, Apply 2 g topically 4  (four) times daily. Use on the middle finger of the right hand. Do not use on any wounds.   DULoxetine (CYMBALTA) 30 MG capsule, Take 1 capsule (30 mg total) by mouth 2 (two) times daily.   glucose blood (ONETOUCH VERIO) test strip, 1 each by Other route as needed for other. Use as instructed. Tests 3-4 times daily.   Insulin Pen Needle 32G X 4 MM MISC, Use to inject insulin 4 times a day and semaglutide once weekly   Lancets MISC, 100 Lancets by Does not apply route 3 (three) times daily as needed.   loperamide (IMODIUM A-D) 2 MG tablet, Take 4-8 mg by mouth as needed for diarrhea or loose stools.   Menthol-Methyl Salicylate (SALONPAS PAIN RELIEF PATCH) PTCH, Apply 4-6 patches topically daily as needed (pain).   MYRBETRIQ 50 MG TB24 tablet, Take 1 tablet (50  mg total) by mouth daily. (Patient not taking: Reported on 05/05/2022)   nystatin powder, Apply 1 application  topically 3 (three) times daily as needed.   OVER THE COUNTER MEDICATION, Take 1 tablet by mouth daily. Brain Health otc supplement   oxybutynin (DITROPAN-XL) 10 MG 24 hr tablet, Take 10 mg by mouth at bedtime.   pregabalin (LYRICA) 200 MG capsule, TAKE 1 CAPSULE BY MOUTH THREE TIMES A DAY   traZODone (DESYREL) 100 MG tablet, Take 2 tablets (200 mg total) by mouth at bedtime as needed for sleep.   Vibegron (GEMTESA) 75 MG TABS, Take 75 mg by mouth daily.  Review of Systems:  Review of system negative unless stated in the problem list or HPI.    Physical Exam:  Vitals:   09/10/22 1516  BP: 135/71  Pulse: 64  Temp: 98.2 F (36.8 C)  TempSrc: Oral  SpO2: 97%  Weight: 234 lb 6.4 oz (106.3 kg)  Height: 5' 5"  (1.651 m)    Physical Exam General: NAD HENT: NCAT Lungs: CTAB, no wheeze, rhonchi or rales.  Cardiovascular: Normal heart sounds, no r/m/g, 2+ pulses in all extremities. No LE edema Abdomen: No TTP, normal bowel sounds MSK: No asymmetry or muscle atrophy. Restricted passive and active range of motion.  Skin: no  lesions noted on exposed skin Neuro: Alert and oriented x4. CN grossly intact Psych: Normal mood and normal affect   Assessment & Plan:   Cellulitis of right leg Right leg improved with abx in erythema. She does have slightly more lesion going up the thigh. Swelling has improved as pt took lasix every day last week but none this week. She is hesitant to take the lasix due to her urinary incontinence but this will be best for patient. For the new lesions, unknown etiology but plan is to obtain biopsy if new lesion identified at follow up visit.  -1 week follow up with PCP, Dr. Jimmye Norman.   Shoulder impingement syndrome, right Pt has right shoulder pain that is ongoing for 3 months. She had 2 surgeries on the right shoulder. Pt has restricted passive ROM due to pain and restricted active ROM due to pain. Very difficult to gauge pt's pain as she is very sensitive to pain. She is using voltaren gel and that is providing minimal relief. She is already taking oxycodone for pain at baseline. She will benefit from ROM exercises and I have provided those to the patient. If no relief at follow up, would send to sports medicine or orthopedic for further evaluation given previous shoulder hx.    See Encounters Tab for problem based charting.  Patient discussed with Dr. Letha Cape, MD Tillie Rung. [facility] Internal Medicine Residency, PGY-2

## 2022-09-10 NOTE — Patient Instructions (Addendum)
Jennifer [name], it was a pleasure seeing you today! You endorsed feeling well today. Below are some of the things we talked about this visit. We look forward to seeing you in the follow up appointment!  Today we discussed: Please continue taking your medications. Take the lasix every day if you can. It usually last 6 hours.  You have a follow up appointment next week with Dr. Jimmye Norman on 09/18/22.   For your shoulders, I am giving you exercises to do, please do them every day.   Do not pick on the spots on your legs.  I have ordered the following labs today:  Lab Orders  No laboratory test(s) ordered today      Referrals ordered today:   Referral Orders  No referral(s) requested today     I have ordered the following medication/changed the following medications:   Stop the following medications: There are no discontinued medications.   Start the following medications: No orders of the defined types were placed in this encounter.    Follow-up: follow up with Dr. Jimmye Norman 09/18/22  Please make sure to arrive 15 minutes prior to your next appointment. If you arrive late, you may be asked to reschedule.   We look forward to seeing you next time. Please call our clinic at [phone] if you have any questions or concerns. The best time to call is Monday-Friday from 9am-4pm, but there is someone available 24/7. If after hours or the weekend, call the main hospital number and ask for the Internal Medicine Resident On-Call. If you need medication refills, please notify your pharmacy one week in advance and they will send [place] a request.  Thank you for letting us take part in your care. Wishing you the best!  Thank you, Idamae Schuller, MD

## 2022-09-11 ENCOUNTER — Other Ambulatory Visit: Payer: Self-pay

## 2022-09-11 MED ORDER — METOPROLOL SUCCINATE ER 100 MG PO TB24: 100.0000 mg | ORAL_TABLET | Freq: Every day | ORAL | 3 refills | Status: DC

## 2022-09-11 NOTE — Assessment & Plan Note (Addendum)
Right leg improved with abx in erythema. She does have slightly more lesion going up the thigh. Swelling has improved as pt took lasix every day last week but none this week. She is hesitant to take the lasix due to her urinary incontinence but this will be best for patient. For the new lesions, unknown etiology but plan is to obtain biopsy if new lesion identified at follow up visit.  -1 week follow up with PCP, Dr. Jimmye Norman.

## 2022-09-11 NOTE — Assessment & Plan Note (Signed)
Pt has right shoulder pain that is ongoing for 3 months. She had 2 surgeries on the right shoulder. Pt has restricted passive ROM due to pain and restricted active ROM due to pain. Very difficult to gauge pt's pain as she is very sensitive to pain. She is using voltaren gel and that is providing minimal relief. She is already taking oxycodone for pain at baseline. She will benefit from ROM exercises and I have provided those to the patient. If no relief at follow up, would send to sports medicine or orthopedic for further evaluation given previous shoulder hx.

## 2022-09-17 NOTE — Progress Notes (Signed)
Internal Medicine Clinic Attending  I saw and evaluated the patient.  I personally confirmed the key portions of the history and exam documented by Dr. Khan and I reviewed pertinent patient test results.  The assessment, diagnosis, and plan were formulated together and I agree with the documentation in the resident's note.  

## 2022-09-18 ENCOUNTER — Ambulatory Visit (HOSPITAL_COMMUNITY): Payer: Medicare Other | Attending: Internal Medicine

## 2022-09-18 ENCOUNTER — Ambulatory Visit (INDEPENDENT_AMBULATORY_CARE_PROVIDER_SITE_OTHER): Payer: Medicare Other | Admitting: Internal Medicine

## 2022-09-18 ENCOUNTER — Ambulatory Visit (INDEPENDENT_AMBULATORY_CARE_PROVIDER_SITE_OTHER): Payer: Medicare Other | Admitting: Dietician

## 2022-09-18 ENCOUNTER — Encounter: Payer: Self-pay | Admitting: Dietician

## 2022-09-18 VITALS — BP 161/75 | HR 72 | Temp 98.0°F | Wt 229.6 lb

## 2022-09-18 DIAGNOSIS — F332 Major depressive disorder, recurrent severe without psychotic features: Secondary | ICD-10-CM

## 2022-09-18 DIAGNOSIS — E1159 Type 2 diabetes mellitus with other circulatory complications: Secondary | ICD-10-CM

## 2022-09-18 DIAGNOSIS — Z720 Tobacco use: Secondary | ICD-10-CM | POA: Diagnosis not present

## 2022-09-18 DIAGNOSIS — N3946 Mixed incontinence: Secondary | ICD-10-CM

## 2022-09-18 DIAGNOSIS — R269 Unspecified abnormalities of gait and mobility: Secondary | ICD-10-CM

## 2022-09-18 DIAGNOSIS — I499 Cardiac arrhythmia, unspecified: Secondary | ICD-10-CM | POA: Insufficient documentation

## 2022-09-18 DIAGNOSIS — L03115 Cellulitis of right lower limb: Secondary | ICD-10-CM | POA: Diagnosis not present

## 2022-09-18 DIAGNOSIS — Z0000 Encounter for general adult medical examination without abnormal findings: Secondary | ICD-10-CM

## 2022-09-18 DIAGNOSIS — K219 Gastro-esophageal reflux disease without esophagitis: Secondary | ICD-10-CM

## 2022-09-18 DIAGNOSIS — K118 Other diseases of salivary glands: Secondary | ICD-10-CM | POA: Diagnosis not present

## 2022-09-18 DIAGNOSIS — E785 Hyperlipidemia, unspecified: Secondary | ICD-10-CM

## 2022-09-18 DIAGNOSIS — F17210 Nicotine dependence, cigarettes, uncomplicated: Secondary | ICD-10-CM

## 2022-09-18 DIAGNOSIS — E669 Obesity, unspecified: Secondary | ICD-10-CM

## 2022-09-18 DIAGNOSIS — F4321 Adjustment disorder with depressed mood: Secondary | ICD-10-CM

## 2022-09-18 DIAGNOSIS — Z6830 Body mass index (BMI) 30.0-30.9, adult: Secondary | ICD-10-CM

## 2022-09-18 DIAGNOSIS — E118 Type 2 diabetes mellitus with unspecified complications: Secondary | ICD-10-CM

## 2022-09-18 DIAGNOSIS — Z634 Disappearance and death of family member: Secondary | ICD-10-CM

## 2022-09-18 DIAGNOSIS — Z794 Long term (current) use of insulin: Secondary | ICD-10-CM | POA: Diagnosis not present

## 2022-09-18 DIAGNOSIS — E1142 Type 2 diabetes mellitus with diabetic polyneuropathy: Secondary | ICD-10-CM

## 2022-09-18 DIAGNOSIS — R197 Diarrhea, unspecified: Secondary | ICD-10-CM | POA: Diagnosis not present

## 2022-09-18 DIAGNOSIS — E1169 Type 2 diabetes mellitus with other specified complication: Secondary | ICD-10-CM

## 2022-09-18 DIAGNOSIS — I152 Hypertension secondary to endocrine disorders: Secondary | ICD-10-CM

## 2022-09-18 DIAGNOSIS — E119 Type 2 diabetes mellitus without complications: Secondary | ICD-10-CM

## 2022-09-18 DIAGNOSIS — K529 Noninfective gastroenteritis and colitis, unspecified: Secondary | ICD-10-CM | POA: Insufficient documentation

## 2022-09-18 MED ORDER — HYDROCHLOROTHIAZIDE 25 MG PO TABS: 25.0000 mg | ORAL_TABLET | Freq: Every day | ORAL | 3 refills | Status: DC

## 2022-09-18 MED ORDER — PANTOPRAZOLE SODIUM 40 MG PO TBEC: 40.0000 mg | DELAYED_RELEASE_TABLET | Freq: Every day | ORAL | 1 refills | Status: DC

## 2022-09-18 MED ORDER — TRESIBA FLEXTOUCH 200 UNIT/ML ~~LOC~~ SOPN: PEN_INJECTOR | SUBCUTANEOUS | 3 refills | Status: DC

## 2022-09-18 MED ORDER — MYRBETRIQ 50 MG PO TB24: 50.0000 mg | ORAL_TABLET | Freq: Every day | ORAL | 3 refills | Status: DC

## 2022-09-18 NOTE — Patient Instructions (Addendum)
Jennifer [name],  You're looking good, and I'm so happy to hear that you're working through [place]'s loss.  It is such a long process.  I'm proud of you for reaching out for help.  Today we talked about your diarrhea - I would continue to take the Immodium - this will pass.  We talked about your heartburn, and you will start a medicine each day called pantoprazole. You have had some low blood sugars so we will decrease your insulin to 67 units every day.   I have reordered your Myrbetriq. Please call Dr. Katy Fitch to schedule your eye exam! Also, did you ever follow up with your referral for colonoscopy? It was placed in April.   Please stop using Santyl cream on your leg spots - the antibacterial ointment is ok.  Finally, today your heartbeat was irregular and we checked the rhythm with an EKG.  Good news, it's normal! Nothing to worry about.  I'll check your heartbeat every visit as usual.  Take care and stay well! See you in 3  months  Dr. Jimmye Norman

## 2022-09-18 NOTE — Progress Notes (Addendum)
Diabetes Self-Management Education  Visit Type: Annual Follow-Up  Appt. Start Time: 1000 Appt. End Time: 1035  09/18/2022  [name], identified by name and date of birth, is a 61 y.o. female with a diagnosis of Diabetes:  .   ASSESSMENT   Happy with her wight loss and blood sugar control. Her Tresiba dose was reduced today. We discussed what to do if she has high blood sugars, check more often, drink more water and reduce carbs.  Wt Readings from Last 10 Encounters:  09/18/22 229 lb 9.6 oz (104.1 kg)  09/10/22 234 lb 6.4 oz (106.3 kg)  09/01/22 236 lb (107 kg)  06/26/22 235 lb 8 oz (106.8 kg)  05/14/22 235 lb (106.6 kg)  05/07/22 233 lb (105.7 kg)  04/24/22 235 lb 3.2 oz (106.7 kg)  04/01/22 236 lb 14.4 oz (107.5 kg)  03/25/22 234 lb 9.6 oz (106.4 kg)  03/13/22 235 lb 4.8 oz (106.7 kg)   Lab Results  Component Value Date   HGBA1C 7.1 (A) 09/01/2022   HGBA1C 7.4 (H) 05/07/2022   HGBA1C 7.3 (A) 02/06/2022   HGBA1C 7.7 (A) 12/04/2021   HGBA1C 7.0 (A) 09/19/2021    BP Readings from Last 3 Encounters:  09/18/22 (!) 161/75  09/10/22 135/71  09/01/22 (!) 146/71  Says she forgot to take her medicine this morning.     Diabetes Self-Management Education - 09/18/22 1000       Visit Information   Visit Type Annual Follow-Up      Health Coping   How would you rate your overall health? Fair      Psychosocial Assessment   Patient Belief/Attitude about Diabetes Motivated to manage diabetes    What is the hardest part about your diabetes right now, causing you the most concern, or is the most worrisome to you about your diabetes?   Other (comment)   burned out a little bit   Self-care barriers Unsteady gait/risk for falls    Self-management support Doctor's office;CDE visits    Patient Concerns Support    Special Needs None    Preferred Learning Style No preference indicated    How often do you need to have someone help you when you read instructions, pamphlets,  or other written materials from your doctor or pharmacy? 1 - Never    What is the last grade level you completed in school? 14      Pre-Education Assessment   Patient understands the diabetes disease and treatment process. Demonstrates understanding / competency    Patient understands incorporating nutritional management into lifestyle. Comprehends key points    Patient undertands incorporating physical activity into lifestyle. Demonstrates understanding / competency    Patient understands using medications safely. Demonstrates understanding / competency   goal to take tresiba abotu the same time every day   Patient understands monitoring blood glucose, interpreting and using results Comprehends key points    Patient understands prevention, detection, and treatment of chronic complications. Compreheands key points    Patient understands how to develop strategies to address psychosocial issues. Comprehends key points      Complications   Last HgB A1C per patient/outside source 7.1 %    Number of hypoglycemic episodes per month 10    Can you tell when your blood sugar is low? Yes   sometimes yes and sometimes no   What do you do if your blood sugar is low? drinks more soda or finds something sweet    Number of hyperglycemic  episodes ( >'200mg'$ /dL): Occasional    Can you tell when your blood sugar is high? Yes   dry month and pees a lot   What do you do if your blood sugar is high? drinks a lot of water    Have you had a dilated eye exam in the past 12 months? No   Eye exam-helped her reschedule   Have you had a dental exam in the past 12 months? No   working on it and does not have teeth, gargles, brushes gums   Are you checking your feet? Yes    How many days per week are you checking your feet? 7      Activity / Exercise   Activity / Exercise Type ADL's;Light (walking / raking leaves)    How many days per week do you exercise? 2    How many minutes per day do you exercise? 60    Total  minutes per week of exercise 120      Patient Education   Previous Diabetes Education Yes (please comment)   here in the past     Individualized Goals (developed by patient)   Nutrition Other (comment)   decrese her weight more by drinking more water   Medications Other (comment)   take tresiba at same time daily- plan to set a daily reminder     Outcomes   Expected Outcomes Demonstrated interest in learning but significant barriers to change    Future DMSE 2 months    Program Status Completed      Subsequent Visit   Since your last visit have you continued or begun to take your medications as prescribed? Yes   except lasix   Since your last visit have you had your blood pressure checked? No    Since your last visit have you experienced any weight changes? Loss    Weight Loss (lbs) 30    Since your last visit, are you checking your blood glucose at least once a day? Yes             Individualized Plan for Diabetes Self-Management Training:   Learning Objective:  Patient will have a greater understanding of diabetes self-management. Patient education plan is to attend individual and/or group sessions per assessed needs and concerns.   Plan:   Patient Instructions  Your appointment for an eye exam is Wednesday Nov 29 at 1:15 PM with Dr. Wyatt [name]   I will ask Jennifer [name] about therapy, the prescription and the [employer] exercise program.    YOur goals for the next 2 months;   1- decrease her weight by drinking more water to help me cut back on my soda  2- take Jennifer [name] insulin the same time each day  See you in 2 months  Jennifer [name] [phone]       Expected Outcomes:  Demonstrated interest in learning but significant barriers to change  Education material provided: Diabetes Resources  If problems or questions, patient to contact team via:  Phone  Future DSME appointment: 2 months

## 2022-09-18 NOTE — Patient Instructions (Addendum)
Your appointment for an eye exam is Wednesday Nov 29 at 1:15 PM with Dr. Wyatt Portela   I will ask DR. Williams about therapy, the prescription and the [employer] exercise program.    YOur goals for the next 2 months;   1- decrease her weight by drinking more water to help me cut back on my soda  2- take Tresiba insulin the same time each day  See you in 2 months  [name] [phone]

## 2022-09-18 NOTE — Progress Notes (Signed)
61 yo Jennifer [name] is here for routine quarterly f/u of her chronic conditions (DM2 with severe peripheral neuropathy; HTN; chronic pain, among others) as well as for recheck of recent RLE edema, skin lesion, and pain for which she's been seen in [facility] by my partners.  Since her last visit for leg exam on 09/01/22, she has been improving - less LE pain and edema,, no new lesions, and her existing lesions are drying and healing.  She is concerned about the persistent hyperpigmented spots remaining after the excoriated lesions have healed.  She is gradually recovering from the profound grief upon the death of her son.  She is emotionally stable, taking care of herself, and not depressed, though she misses him fiercely.  She has a somewhat strained relationship with her other son, who blames her for making the decision to forgo ongoing ventilation given his unrecoverable illness.    She has also recovered from mild Covid in 07/2022 treated with Paxlovid, with no obvious sequelae.  Continues to experience generalized pain "all over" - no particular location.   Also brings to my attention items documented in problem list.  BP (!) 161/75 (BP Location: Right Arm, Patient Position: Sitting, Cuff Size: Small) Comment: patient had not taken medicine this morning  Pulse 72   Temp 98 F (36.7 C) (Oral)   Wt 229 lb 9.6 oz (104.1 kg)   SpO2 98%   BMI 38.21 kg/m  *Didn't take any medicine today - her ride arrived at 7:30 am.  Exam:  RLE lesions are healing, none new, pain improved, scarring and excoriated lesions remain, much less swelling, leg warm, foot with well healed toe amputation incision, DP 2+, severely decreased sensation, warm, no open wounds. CAlm euthymic affect, diction clear, appropriate conversation.  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  No inflammation or infection.  She has dysesthesias around the ear with touch.     Heart irreg irreg on ausculation and pulse check, lungs clear, neck veins flat.    Assessment and plan:  Healthcare maintenance Overdue for colonoscopy- referred in 03/2022 [facility]).   She has missed their calls.  Given contact info - she will call to schedule.  Diabetes mellitus type 2, controlled, with complications ([place]) P1W 7.1 recently.  Freestyle libre 2 reader reviewed; Having 20% lows/very lows of which she is not aware.  Insulin decreased to 67 units daily.  Next A1C 11/2022.  Eye exam - she is overdue, she'll schedule with Dr. Katy Fitch by call today  Obesity (BMI 30-39.9) Has lost 30# in 1 1/2 years (14 in 04/2021). Continues with good results on Ozempic.  BMI 38.21.  Tobacco abuse Wants the quit line number! Wants to quit smoking.  "I have to stop this".  Positive reinforcement given.  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 Surgical nerve damage discomfort.  She has reached out to ENT and was reassured.    Diabetic polyneuropathy associated with type 2 diabetes mellitus ([place]) She has unfortunately increased her Lyrica from 200 mg to 400 mg tabs tid, an inappropriately high dose.  SHe will resume 200 mg tid and I will increase duloxetine to 60 mg bid (which may or may not be helpful).  Options are limited.  Feels like stepping on hot coals, stabbing.   Abnormality of gait and recurrent  falls Two non-injurious falls recently; multifactorial, including AKA, severe neuropathy of RLE, multiple toe amputations.  Transfers and turning directions are troublesome.  She does her best to be cautious. The motorized WC has been very helpful.  Cellulitis of right lower leg, resolved Antibiotics completed.  Leg swelling, redness, and tenderness have resolved.  The discrete lesions (which she picked) are drying and healing, many of which are now only hyperpigmented scars.    Grief  at loss of child Recovering from grief with help from counselors.  She is able to see forward and to care for herself and to participate in life.  She feels a sense of accomplishment.  We'll continue to support her.  Depression, major, severe recurrence (HCC) Mood is stable on current regimen.  She is well managed.  Acute diarrhea Diarrhea - last few days, liquid, brown, no blood, not painful or crampy.  Allergic to hamburger, can't eat beef, and had a bite of cheeseburger - ever since then has had diarrhea but doesn't feel ill.  Has taken immodium.  Pepto bismol made it worse  Incontinent at night.  Takes an hour to clean up.  Vomited the following day, no blood.  Has been able to eat and drink but doesn't stay in her long.  Food intolerance or food poisoning is possible.  No red flags - advised to continue Imodium (being careful to also avoid constipation), anticipating spontaneous resolution.  GASTROESOPHAGEAL REFLUX DISEASE Bad heartburn, burping, backwash, no association with recumbency or meal, not relieved by pepto bismol. Will resume her PPI, which has controlled these symptoms in the past. Requests 82M prescription.  Irregularly irregular heart rhythm Asymptomatic, incidental on exam today, EKG showed NSR.  No hx of AF to my knowledge.  Monitor.    Hyperlipidemia associated with type 2 diabetes mellitus ([place]) LDL 64 in 03/2022 on statin.  Check annually.

## 2022-09-22 ENCOUNTER — Other Ambulatory Visit: Payer: Self-pay | Admitting: Internal Medicine

## 2022-09-23 ENCOUNTER — Telehealth: Payer: Self-pay | Admitting: *Deleted

## 2022-09-23 ENCOUNTER — Encounter: Payer: Self-pay | Admitting: Internal Medicine

## 2022-09-23 DIAGNOSIS — N3000 Acute cystitis without hematuria: Secondary | ICD-10-CM

## 2022-09-23 NOTE — Assessment & Plan Note (Signed)
Asymptomatic, incidental on exam today, EKG showed NSR.  No hx of AF to my knowledge.  Monitor.

## 2022-09-23 NOTE — Assessment & Plan Note (Signed)
LDL 64 in 03/2022 on statin.  Check annually.

## 2022-09-23 NOTE — Assessment & Plan Note (Signed)
Recovering from grief with help from counselors.  She is able to see forward and to care for herself and to participate in life.  She feels a sense of accomplishment.  We'll continue to support her.

## 2022-09-23 NOTE — Telephone Encounter (Signed)
Called pt - informed of Dr Jimmye Norman' response. Pt stated she can come Thursday to give the urine specimen.

## 2022-09-23 NOTE — Assessment & Plan Note (Addendum)
She has unfortunately increased her Lyrica from 200 mg to 400 mg tabs tid, an inappropriately high dose.  SHe will resume 200 mg tid and I will increase duloxetine to 60 mg bid (which may or may not be helpful).  Options are limited.  Feels like stepping on hot coals, stabbing.

## 2022-09-23 NOTE — Assessment & Plan Note (Signed)
Antibiotics completed.  Leg swelling, redness, and tenderness have resolved.  The discrete lesions (which she picked) are drying and healing, many of which are now only hyperpigmented scars.

## 2022-09-23 NOTE — Assessment & Plan Note (Signed)
Mood is stable on current regimen.  She is well managed.

## 2022-09-23 NOTE — Assessment & Plan Note (Signed)
R ear feels swollen and numb , tender - was informed that it would take a year to recover (June 2023 surg).

## 2022-09-23 NOTE — Assessment & Plan Note (Signed)
Wants the quit line number! Wants to quit smoking.  "I have to stop this".  Positive reinforcement given.

## 2022-09-23 NOTE — Assessment & Plan Note (Addendum)
Has lost 30# in 1 1/2 years (25 in 04/2021). Continues with good results on Ozempic.  BMI 38.21.

## 2022-09-23 NOTE — Assessment & Plan Note (Signed)
Overdue for colonoscopy- referred in 03/2022 [facility]).   She has missed their calls.  Given contact info - she will call to schedule.

## 2022-09-23 NOTE — Assessment & Plan Note (Signed)
Diarrhea - last few days, liquid, brown, no blood, not painful or crampy.  Allergic to hamburger, can't eat beef, and had a bite of cheeseburger - ever since then has had diarrhea but doesn't feel ill.  Has taken immodium.  Pepto bismol made it worse  Incontinent at night.  Takes an hour to clean up.  Vomited the following day, no blood.  Has been able to eat and drink but doesn't stay in her long.  Food intolerance or food poisoning is possible.  No red flags - advised to continue Imodium (being careful to also avoid constipation), anticipating spontaneous resolution.

## 2022-09-23 NOTE — Assessment & Plan Note (Addendum)
A1c 7.1 recently.  Freestyle libre 2 reader reviewed; Having 20% lows/very lows of which she is not aware.  Insulin decreased to 67 units daily.  Next A1C 11/2022.  Eye exam - she is overdue, she'll schedule with Dr. Katy Fitch by call today

## 2022-09-23 NOTE — Assessment & Plan Note (Signed)
Bad heartburn, burping, backwash, no association with recumbency or meal, not relieved by pepto bismol. Will resume her PPI, which has controlled these symptoms in the past. Requests 53M prescription.

## 2022-09-23 NOTE — Telephone Encounter (Signed)
Call from pt stating she has an urinary infection; c/o burning and tingling x 1 week. No odor. States she had several UTI's in the past and was prescribed Pyridium. Thanks

## 2022-09-23 NOTE — Assessment & Plan Note (Signed)
Two non-injurious falls recently; multifactorial, including AKA, severe neuropathy of RLE, multiple toe amputations.  Transfers and turning directions are troublesome.  She does her best to be cautious. The motorized WC has been very helpful.

## 2022-09-24 ENCOUNTER — Other Ambulatory Visit: Payer: Self-pay | Admitting: Internal Medicine

## 2022-09-24 ENCOUNTER — Encounter: Payer: Self-pay | Admitting: *Deleted

## 2022-09-24 DIAGNOSIS — R300 Dysuria: Secondary | ICD-10-CM

## 2022-09-24 NOTE — Telephone Encounter (Signed)
There are no available appts the rest of this week.

## 2022-09-24 NOTE — Telephone Encounter (Signed)
Pt was called - she will be here tomorrow @ 1100AM; informed she have to wait for the results.

## 2022-09-25 ENCOUNTER — Other Ambulatory Visit: Payer: Medicare Other

## 2022-09-26 ENCOUNTER — Other Ambulatory Visit (INDEPENDENT_AMBULATORY_CARE_PROVIDER_SITE_OTHER): Payer: Medicare Other

## 2022-09-26 DIAGNOSIS — N3000 Acute cystitis without hematuria: Secondary | ICD-10-CM | POA: Diagnosis not present

## 2022-09-26 DIAGNOSIS — R300 Dysuria: Secondary | ICD-10-CM

## 2022-09-26 LAB — POCT URINALYSIS DIPSTICK
Bilirubin, UA: NEGATIVE
Glucose, UA: NEGATIVE
Ketones, UA: NEGATIVE
Nitrite, UA: NEGATIVE
Protein, UA: POSITIVE — AB
Spec Grav, UA: 1.02 (ref 1.010–1.025)
Urobilinogen, UA: 0.2 E.U./dL
pH, UA: 7 (ref 5.0–8.0)

## 2022-09-26 MED ORDER — PHENAZOPYRIDINE HCL 95 MG PO TABS: 95.0000 mg | ORAL_TABLET | Freq: Three times a day (TID) | ORAL | 0 refills | Status: AC | PRN

## 2022-09-26 MED ORDER — SULFAMETHOXAZOLE-TRIMETHOPRIM 800-160 MG PO TABS: 1.0000 | ORAL_TABLET | Freq: Two times a day (BID) | ORAL | 0 refills | Status: DC

## 2022-09-26 MED ORDER — CEPHALEXIN 500 MG PO CAPS: 500.0000 mg | ORAL_CAPSULE | Freq: Two times a day (BID) | ORAL | 0 refills | Status: AC

## 2022-09-26 NOTE — Assessment & Plan Note (Signed)
Patient came for lab appointment for a u/a given complaints of dysuria U/a showed moderate blood and signigicant leukocytes concerning for a UTI Will send a urine cx given past history of recurrent UTI and multiple abx courses Will start the patient on bactrim for now to complete a 5 day course

## 2022-09-26 NOTE — Addendum Note (Signed)
Addended by: Aldine Contes on: 09/26/2022 11:32 AM   Modules accepted: Orders

## 2022-09-26 NOTE — Progress Notes (Signed)
Called pt - no answer; left message on self-identified vm of new rxs. (Correction)

## 2022-09-26 NOTE — Progress Notes (Signed)
Antibiotic and [facility]zo [facility]

## 2022-09-26 NOTE — Telephone Encounter (Signed)
Patient came today and left urine sample. Results are in [place]. Please advise.

## 2022-09-26 NOTE — Addendum Note (Signed)
Addended by: Truddie Crumble on: 09/26/2022 08:33 AM   Modules accepted: Orders

## 2022-09-26 NOTE — Progress Notes (Signed)
Called pt - no; left message on self-identified vm of new rxs.

## 2022-09-29 LAB — URINE CULTURE

## 2022-10-02 ENCOUNTER — Telehealth: Payer: Self-pay

## 2022-10-02 ENCOUNTER — Other Ambulatory Visit: Payer: Self-pay | Admitting: Dietician

## 2022-10-02 DIAGNOSIS — Z794 Long term (current) use of insulin: Secondary | ICD-10-CM

## 2022-10-02 NOTE — Progress Notes (Unsigned)
Referral request. Patient calls asking for referral urgently.

## 2022-10-02 NOTE — Telephone Encounter (Signed)
Patient called she stated she was supposed to be referred to [facility] weight loss program but she hasn't heard anything from anyone. Patient stated the class starts Tuesday and she wants to be there. Please call patient back '@[phone] (dads #)

## 2022-10-02 NOTE — Telephone Encounter (Signed)
Spoke with patient. Requested referral. She wants to go to these classes with her sister who is starting them on Tuesday.

## 2022-10-02 NOTE — Telephone Encounter (Signed)
donr

## 2022-10-03 ENCOUNTER — Other Ambulatory Visit: Payer: Self-pay | Admitting: Internal Medicine

## 2022-10-03 DIAGNOSIS — Z9889 Other specified postprocedural states: Secondary | ICD-10-CM

## 2022-10-05 ENCOUNTER — Other Ambulatory Visit: Payer: Self-pay | Admitting: Internal Medicine

## 2022-10-05 DIAGNOSIS — J9611 Chronic respiratory failure with hypoxia: Secondary | ICD-10-CM | POA: Diagnosis not present

## 2022-10-05 DIAGNOSIS — E1159 Type 2 diabetes mellitus with other circulatory complications: Secondary | ICD-10-CM | POA: Diagnosis not present

## 2022-10-05 DIAGNOSIS — J449 Chronic obstructive pulmonary disease, unspecified: Secondary | ICD-10-CM | POA: Diagnosis not present

## 2022-10-05 DIAGNOSIS — Z89512 Acquired absence of left leg below knee: Secondary | ICD-10-CM | POA: Diagnosis not present

## 2022-10-05 DIAGNOSIS — J441 Chronic obstructive pulmonary disease with (acute) exacerbation: Secondary | ICD-10-CM | POA: Diagnosis not present

## 2022-10-06 ENCOUNTER — Other Ambulatory Visit: Payer: Self-pay | Admitting: Internal Medicine

## 2022-10-06 ENCOUNTER — Telehealth: Payer: Self-pay

## 2022-10-06 DIAGNOSIS — Z89612 Acquired absence of left leg above knee: Secondary | ICD-10-CM

## 2022-10-06 DIAGNOSIS — Z794 Long term (current) use of insulin: Secondary | ICD-10-CM

## 2022-10-06 NOTE — Telephone Encounter (Signed)
LVMT pt requesting call back reference PREP referral.  

## 2022-10-06 NOTE — Telephone Encounter (Signed)
yes

## 2022-10-06 NOTE — Telephone Encounter (Signed)
Called pt reference her interest and referral for PREP.  Confirmed she wants to do class that starts tomorrow 6p-715p.  Can do telephone intake tomorrow at 1130a.  Requested I call 336 [phone]

## 2022-10-07 ENCOUNTER — Telehealth: Payer: Self-pay | Admitting: Internal Medicine

## 2022-10-07 ENCOUNTER — Other Ambulatory Visit: Payer: Self-pay | Admitting: *Deleted

## 2022-10-07 DIAGNOSIS — G8929 Other chronic pain: Secondary | ICD-10-CM

## 2022-10-07 NOTE — Telephone Encounter (Signed)
RTC to patient still continues to have the same symptoms of the UTI.  Burning and pain after urination "down there" per patient.   Has completed the Antibiotic.  No fevers at present.

## 2022-10-07 NOTE — Telephone Encounter (Signed)
Refill Request  Pt also state she finished her antibiotic medication and she still has an infection. Please call back   oxyCODONE (OXY IR/ROXICODONE) 5 MG immediate release tablet    [id number]- [place], [place] - [address]

## 2022-10-07 NOTE — Telephone Encounter (Signed)
Last visit 09/18/2022.  Next appointment 12/25/2022.  No recent [employer].

## 2022-10-08 MED ORDER — OXYCODONE HCL 5 MG PO TABS: 5.0000 mg | ORAL_TABLET | Freq: Four times a day (QID) | ORAL | 0 refills | Status: DC | PRN

## 2022-10-08 NOTE — Telephone Encounter (Signed)
Call to patient-Rescheduled for 10/10/2022. Patient was called and informed of.

## 2022-10-08 NOTE — Telephone Encounter (Signed)
Thanks

## 2022-10-08 NOTE — Telephone Encounter (Signed)
RTC to patient given appointment for 10/14/2022 3:45 PM.  Patient to be called if there is a sooner cancellation.

## 2022-10-10 ENCOUNTER — Telehealth: Payer: Self-pay | Admitting: *Deleted

## 2022-10-10 ENCOUNTER — Encounter: Payer: Medicare Other | Admitting: Internal Medicine

## 2022-10-10 NOTE — Telephone Encounter (Signed)
Call from patient over slept missed ride for appointment this morning. Having Vertigo as well today.  Spoke with Dr. Marlou Sa patient will need to come in for assessment of the Vertigo as well as the UTI.  Patient to be rescheduled for an appointment on 10/21/2022.

## 2022-10-10 NOTE — Progress Notes (Deleted)
CHR DIAST HF  VENOUS INSUFF W EDEMA  HTN  PAD  NOCTURNAL HYPOX DERD CKD2  DM NEUROPATHY RETIONAPTHY HLD  MULTINODULAR THYROID  ANEMA DEP OUD  URINARY INCONTINENCE\  UTI: Patient recently provided urine at a lab only visit due to concerns of UTI. At that time a culture was sent out and she was started on prophylactic bactrim. Since then, the culture resulted positive for E. Coli sensitive to bactrim.

## 2022-10-10 NOTE — Telephone Encounter (Signed)
Thank you Regino Schultze!

## 2022-10-14 ENCOUNTER — Encounter: Payer: Medicare Other | Admitting: Student

## 2022-10-20 ENCOUNTER — Other Ambulatory Visit: Payer: Medicare Other

## 2022-10-20 ENCOUNTER — Other Ambulatory Visit: Payer: Self-pay | Admitting: Internal Medicine

## 2022-10-20 ENCOUNTER — Telehealth: Payer: Self-pay | Admitting: *Deleted

## 2022-10-20 ENCOUNTER — Other Ambulatory Visit: Payer: Self-pay

## 2022-10-20 DIAGNOSIS — K219 Gastro-esophageal reflux disease without esophagitis: Secondary | ICD-10-CM

## 2022-10-20 DIAGNOSIS — G8929 Other chronic pain: Secondary | ICD-10-CM

## 2022-10-20 DIAGNOSIS — E1169 Type 2 diabetes mellitus with other specified complication: Secondary | ICD-10-CM

## 2022-10-20 DIAGNOSIS — E1142 Type 2 diabetes mellitus with diabetic polyneuropathy: Secondary | ICD-10-CM

## 2022-10-20 DIAGNOSIS — Z794 Long term (current) use of insulin: Secondary | ICD-10-CM

## 2022-10-20 DIAGNOSIS — R300 Dysuria: Secondary | ICD-10-CM

## 2022-10-20 DIAGNOSIS — M62838 Other muscle spasm: Secondary | ICD-10-CM

## 2022-10-20 DIAGNOSIS — E119 Type 2 diabetes mellitus without complications: Secondary | ICD-10-CM

## 2022-10-20 DIAGNOSIS — F17200 Nicotine dependence, unspecified, uncomplicated: Secondary | ICD-10-CM

## 2022-10-20 DIAGNOSIS — E1121 Type 2 diabetes mellitus with diabetic nephropathy: Secondary | ICD-10-CM

## 2022-10-20 DIAGNOSIS — N3000 Acute cystitis without hematuria: Secondary | ICD-10-CM

## 2022-10-20 NOTE — Progress Notes (Signed)
Patient presented a day early, supposed to be seen tomorrow.  She cannot return tomorrow and is requesting a urine test to see if she has a recurrent UTI.   Plan UA, Dipstick UC  Follow up culture for guidance of Antibiotics if needed.

## 2022-10-20 NOTE — Telephone Encounter (Signed)
Pt is here at the front desk requesting to be seen today instead of tomorrow in which she has an appt for possible UTI. She wants to know if she can give [place] an urine specimen today for a bladder infection. Stated she needs another rx . She will be unable to come back tomorrow.  I talked to Dr Daryll Drown, Attending - orders placed.

## 2022-10-20 NOTE — Telephone Encounter (Signed)
Pt was unable to give an urine specimen; even after 2 cups of water and 1 cup of coffee. Stated she will come back tomorrow at her schedule appt with Dr Collene Gobble.

## 2022-10-21 ENCOUNTER — Other Ambulatory Visit: Payer: Self-pay

## 2022-10-21 ENCOUNTER — Ambulatory Visit (INDEPENDENT_AMBULATORY_CARE_PROVIDER_SITE_OTHER): Payer: Medicare Other | Admitting: Student

## 2022-10-21 ENCOUNTER — Other Ambulatory Visit: Payer: Self-pay | Admitting: Internal Medicine

## 2022-10-21 ENCOUNTER — Telehealth: Payer: Self-pay

## 2022-10-21 ENCOUNTER — Encounter: Payer: Self-pay | Admitting: Student

## 2022-10-21 VITALS — BP 124/62 | HR 66 | Temp 98.2°F | Resp 28 | Ht 65.0 in | Wt 235.5 lb

## 2022-10-21 DIAGNOSIS — R300 Dysuria: Secondary | ICD-10-CM

## 2022-10-21 DIAGNOSIS — G8929 Other chronic pain: Secondary | ICD-10-CM

## 2022-10-21 LAB — POCT URINALYSIS DIPSTICK
Bilirubin, UA: NEGATIVE
Glucose, UA: POSITIVE — AB
Ketones, UA: NEGATIVE
Leukocytes, UA: NEGATIVE
Nitrite, UA: NEGATIVE
Protein, UA: POSITIVE — AB
Spec Grav, UA: 1.015 (ref 1.010–1.025)
Urobilinogen, UA: 0.2 E.U./dL
pH, UA: 6.5 (ref 5.0–8.0)

## 2022-10-21 LAB — GLUCOSE, CAPILLARY: Glucose-Capillary: 168 mg/dL — ABNORMAL HIGH (ref 70–99)

## 2022-10-21 MED ORDER — ACETAMINOPHEN ER 650 MG PO TBCR: 1300.0000 mg | EXTENDED_RELEASE_TABLET | Freq: Three times a day (TID) | ORAL | Status: DC | PRN

## 2022-10-21 MED ORDER — PHENAZOPYRIDINE HCL 100 MG PO TABS: 100.0000 mg | ORAL_TABLET | Freq: Three times a day (TID) | ORAL | 0 refills | Status: DC

## 2022-10-21 MED ORDER — PHENAZOPYRIDINE HCL 200 MG PO TABS: 200.0000 mg | ORAL_TABLET | Freq: Three times a day (TID) | ORAL | 0 refills | Status: DC

## 2022-10-21 NOTE — Addendum Note (Signed)
Addended by: Truddie Crumble on: 10/21/2022 11:58 AM   Modules accepted: Orders

## 2022-10-21 NOTE — Patient Outreach (Signed)
  Care Coordination   In Person Provider Office Visit Note   10/21/2022 Name: Jennifer [name] MRN: [id number] DOB: Jul 25, 1961  Jennifer [name] is a 61 y.o. year old female who sees Jimmye Norman, Elaina Pattee, MD for primary care. I spoke with  [name] by phone today.  What matters to the patients health and wellness today?  Met with patient onsite at [facility] and discussed care coordination services.  Patient agreed to nursing care coordinator call to work on overall health and DM.    Goals Addressed   None     SDOH assessments and interventions completed:  Yes  SDOH Interventions Today    Flowsheet Row Most Recent Value  SDOH Interventions   Housing Interventions Intervention Not Indicated  Transportation Interventions Intervention Not Indicated        Care Coordination Interventions Activated:  Yes  Care Coordination Interventions:  Yes, provided   Follow up plan: Referral made to RN Care Coordination    Encounter Outcome:  Pt. Visit Completed

## 2022-10-21 NOTE — Patient Instructions (Addendum)
[name], it was a pleasure seeing you today!  Today we discussed: - Based on your urine studies today, it does not look like you have a urinary tract infection. The medication for burning is called Pyridium. Take this three times daily for the next three days.  - Please make sure to drink plenty of water. This can help with your dizziness as well as help prevent future infections.   I have ordered the following labs today:  Lab Orders         Glucose, capillary         POCT Urinalysis Dipstick (77412)       Follow-up:  1 month    Please make sure to arrive 15 minutes prior to your next appointment. If you arrive late, you may be asked to reschedule.   We look forward to seeing you next time. Please call our clinic at [phone] if you have any questions or concerns. The best time to call is Monday-Friday from 9am-4pm, but there is someone available 24/7. If after hours or the weekend, call the main hospital number and ask for the Internal Medicine Resident On-Call. If you need medication refills, please notify your pharmacy one week in advance and they will send [place] a request.  Thank you for letting us take part in your care. Wishing you the best!  Thank you, Sanjuan Dame, MD

## 2022-10-22 NOTE — Progress Notes (Signed)
[employer] PREP Weekly Session  Patient Details  Name: Jennifer [name] MRN: [id number] Date of Birth: 1961-09-26 Age: 61 y.o. PCP: Angelica Pou, MD  Vitals:   10/21/22 1300  Weight: 235 lb (106.6 kg)     [facility] Weekly seesion - 10/22/22 1200       [facility] "PREP" Location   [facility] [employer] [facility] [employer] [facility]      Weekly Session   Topic Discussed Healthy eating tips    Minutes exercised this week --   none reported   Classes attended to date 2             Barnett Hatter 10/22/2022, 12:39 PM

## 2022-10-23 ENCOUNTER — Other Ambulatory Visit: Payer: Self-pay | Admitting: Internal Medicine

## 2022-10-23 DIAGNOSIS — K219 Gastro-esophageal reflux disease without esophagitis: Secondary | ICD-10-CM

## 2022-10-23 DIAGNOSIS — E1142 Type 2 diabetes mellitus with diabetic polyneuropathy: Secondary | ICD-10-CM

## 2022-10-23 DIAGNOSIS — E1169 Type 2 diabetes mellitus with other specified complication: Secondary | ICD-10-CM

## 2022-10-23 DIAGNOSIS — E118 Type 2 diabetes mellitus with unspecified complications: Secondary | ICD-10-CM

## 2022-10-23 DIAGNOSIS — E119 Type 2 diabetes mellitus without complications: Secondary | ICD-10-CM

## 2022-10-23 DIAGNOSIS — E1121 Type 2 diabetes mellitus with diabetic nephropathy: Secondary | ICD-10-CM

## 2022-10-23 DIAGNOSIS — I152 Hypertension secondary to endocrine disorders: Secondary | ICD-10-CM

## 2022-10-23 DIAGNOSIS — N3946 Mixed incontinence: Secondary | ICD-10-CM

## 2022-10-23 DIAGNOSIS — E1159 Type 2 diabetes mellitus with other circulatory complications: Secondary | ICD-10-CM

## 2022-10-23 DIAGNOSIS — F5104 Psychophysiologic insomnia: Secondary | ICD-10-CM

## 2022-10-23 DIAGNOSIS — Z794 Long term (current) use of insulin: Secondary | ICD-10-CM

## 2022-10-23 MED ORDER — TRAZODONE HCL 100 MG PO TABS: 200.0000 mg | ORAL_TABLET | Freq: Every evening | ORAL | 11 refills | Status: DC | PRN

## 2022-10-23 MED ORDER — PANTOPRAZOLE SODIUM 40 MG PO TBEC: 40.0000 mg | DELAYED_RELEASE_TABLET | Freq: Every day | ORAL | 1 refills | Status: DC

## 2022-10-23 MED ORDER — HYDROCHLOROTHIAZIDE 25 MG PO TABS: 25.0000 mg | ORAL_TABLET | Freq: Every day | ORAL | 3 refills | Status: DC

## 2022-10-23 MED ORDER — DULOXETINE HCL 30 MG PO CPEP: 30.0000 mg | ORAL_CAPSULE | Freq: Two times a day (BID) | ORAL | 3 refills | Status: DC

## 2022-10-23 MED ORDER — INSULIN PEN NEEDLE 32G X 4 MM MISC: 10 refills | Status: DC

## 2022-10-23 MED ORDER — METOPROLOL SUCCINATE ER 100 MG PO TB24: 100.0000 mg | ORAL_TABLET | Freq: Every day | ORAL | 3 refills | Status: DC

## 2022-10-23 MED ORDER — AMLODIPINE BESYLATE 10 MG PO TABS: 10.0000 mg | ORAL_TABLET | Freq: Every day | ORAL | 3 refills | Status: DC

## 2022-10-23 MED ORDER — TRESIBA FLEXTOUCH 200 UNIT/ML ~~LOC~~ SOPN: PEN_INJECTOR | SUBCUTANEOUS | 3 refills | Status: DC

## 2022-10-23 MED ORDER — ROSUVASTATIN CALCIUM 20 MG PO TABS: 20.0000 mg | ORAL_TABLET | Freq: Every morning | ORAL | 3 refills | Status: DC

## 2022-10-23 MED ORDER — OZEMPIC (1 MG/DOSE) 4 MG/3ML ~~LOC~~ SOPN: 1.0000 mg | PEN_INJECTOR | SUBCUTANEOUS | 12 refills | Status: DC

## 2022-10-23 MED ORDER — MYRBETRIQ 50 MG PO TB24: 50.0000 mg | ORAL_TABLET | Freq: Every day | ORAL | 3 refills | Status: DC

## 2022-10-23 MED ORDER — PREGABALIN 200 MG PO CAPS: ORAL_CAPSULE | ORAL | 3 refills | Status: DC

## 2022-10-23 MED ORDER — METFORMIN HCL 500 MG PO TABS: 500.0000 mg | ORAL_TABLET | Freq: Two times a day (BID) | ORAL | 3 refills | Status: DC

## 2022-10-23 NOTE — Progress Notes (Signed)
CC: dysuria   HPI:  [name] is a 61 y.o. person with medical history as below presenting to [facility] for dysuria.  Please see problem-based list for further details, assessments, and plans.  Past Medical History:  Diagnosis Date   Acute vestibular syndrome, resolved 03/02/2021   Angioedema 06/14/2021   Asthma    Burn of finger of right hand, second degree 02/05/2021   Occurred during cooking (frying), poor sensation due to neuropathy, pt punctured blister to allow it to drain, skin has since desquamated over dorsal joint, no infection.  Keep clean and dry, OTC antibacterial ointment.   Cataract    CHF (congestive heart failure) (HCC)    Chronic bronchitis ([place])    "I get it alot" (09/28/2013)   Chronic diastolic heart failure (HCC)    grade 2 per 2D echocardiogram (01/2013)   Chronic kidney disease    Chronic lower back pain    Chronic pain syndrome 12/03/2011   Likely secondary to depression, "fibromyalgia", neuropathy, and obesity. Lumbar MRI 2014 no sig change from prior (2008) : Stable hypertrophic facet disease most notable at L4-5. Stable shallow left foraminal/extraforaminal disc protrusion at L4-5. No direct neural compression.       COPD 01/08/2007   PFT's 05/2007 : FEV1/FVC 82, FEV1 64% pred, FEF 25-75% 40% predicted, 16% improvement in FEV1 with bronchodilators.      Depression    Diabetic peripheral neuropathy (HCC)    Dizziness, resolved (admitted with vestibular migraine)    DVT of upper extremity (deep vein thrombosis) ([place]) 03/11/2013   Secondary to PICC line. Right brachial vein, diagnosed on 03/10/2013 Coumadin for 3 months. End date 06/10/2013    Environmental allergies    Hx: of   Fatty liver 2003   observed on ultrasound abdomen   Fibromyalgia    GERD (gastroesophageal reflux disease)    Glaucoma    Headache    History of bacterial endocarditis 2014   Endocarditis involving mitral and tricuspid valves.  S. Aureus and GBS.    History of use of  hearing aid    Hyperlipidemia    Hyperplastic colon polyp 12/2010   Per colonoscopy (12/2010) - Dr. 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  Evaluated on 07/08/2021 by podiatrist Dr. Amalia Hailey who debrided necrotic tissue and prescribed doxycycline.  He will see her again in 3 weeks.  The location of this ulcer on the dorsal aspect of the toe is somewhat atypical for a purely diabetic foot wound, and she does have a strong DP pulse.  I did not examine h   Type II diabetes mellitus with peripheral circulatory disorders, uncontrolled DX: 1993   Insulin dep. Poor control. Complicated by diabetic foot ulcer and diabetic eye disease.     Upper respiratory tract infection due to COVID-19  virus 07/22/2022   Review of Systems:  As per HPI  Physical Exam:  Vitals:   10/21/22 1040  BP: 124/62  Pulse: 66  Resp: (!) 28  Temp: 98.2 F (36.8 C)  TempSrc: Oral  SpO2: 95%  Weight: 235 lb 8 oz (106.8 kg)  Height: '5\' 5"'$  (1.651 m)   General: Resting comfortably in no acute distress CV: Regular rate, rhythm. No murmurs appreciated. Pulm: Normal work of breathing on room air. Clear to auscultation bilaterally.  GI: Abdomen soft, non-tender, non-distended. Normoactive bowel sounds.  MSK: No costovertebral tenderness bilaterally.  Neuro: Awake, alert, conversing  appropriately.  Psych: Normal mood, affect, speech.   Assessment & Plan:   Dysuria Jennifer [name] is presenting to clinic today for continued dysuria. Patient was recently diagnosed with urinary tract infection a few weeks ago. Reports that despite the antibiotic therapy, she continues to have dysuria. She has been eating and drinking well without difficulties. However, she does note she drinks coffee and sodas predominantly. Denies any known skin changes in the area or pruritus. Also denies fevers, chills, body aches, abdominal pain, nausea, vomiting, vaginal pain, pain while wiping, or recent sexual activity.   Urine dipstick during today's visit did not reveal evidence of an infection. Exam today re-assuring. Ideally would like to perform a GU exam but difficult given her mobility issues and clinic limitations. Based her history, her pain does sound more consistent with urethral pain rather than vaginal pain. Per chart review, Jennifer [name] has had similar experiences in the past that have resolved with Pyridium. Will plan for a course of this, can consider vaginal estrogen if dysuria persists.   - Pyridium ordered, discussed likely change in color of urine - Encouraged increased water intake  Patient discussed with Dr.  Maurilio Lovely, MD Internal Medicine PGY-3 Pager: [phone]

## 2022-10-23 NOTE — Assessment & Plan Note (Addendum)
[name] is presenting to clinic today for continued dysuria. Patient was recently diagnosed with urinary tract infection a few weeks ago. Reports that despite the antibiotic therapy, she continues to have dysuria. She has been eating and drinking well without difficulties. However, she does note she drinks coffee and sodas predominantly. Denies any known skin changes in the area or pruritus. Also denies fevers, chills, body aches, abdominal pain, nausea, vomiting, vaginal pain, pain while wiping, or recent sexual activity.   Urine dipstick during today's visit did not reveal evidence of an infection. Exam today re-assuring. Ideally would like to perform a GU exam but difficult given her mobility issues and clinic limitations. Based her history, her pain does sound more consistent with urethral pain rather than vaginal pain. Per chart review, Ms. Nestler has had similar experiences in the past that have resolved with Pyridium. Will plan for a course of this, can consider vaginal estrogen if dysuria persists.   - Pyridium ordered, discussed likely change in color of urine - Encouraged increased water intake

## 2022-10-27 ENCOUNTER — Other Ambulatory Visit: Payer: Self-pay | Admitting: Internal Medicine

## 2022-10-27 DIAGNOSIS — E118 Type 2 diabetes mellitus with unspecified complications: Secondary | ICD-10-CM

## 2022-10-27 MED ORDER — ALBUTEROL SULFATE HFA 108 (90 BASE) MCG/ACT IN AERS: INHALATION_SPRAY | RESPIRATORY_TRACT | 3 refills | Status: DC

## 2022-10-27 MED ORDER — LANCETS MISC: 5 refills | Status: DC

## 2022-10-27 NOTE — Progress Notes (Signed)
Easy Touch TWI 28G Lancets # 100

## 2022-10-28 NOTE — Progress Notes (Signed)
Internal Medicine Clinic Attending ? ?Case discussed with Dr. Braswell  At the time of the visit.  We reviewed the resident?s history and exam and pertinent patient test results.  I agree with the assessment, diagnosis, and plan of care documented in the resident?s note.  ?

## 2022-10-28 NOTE — Progress Notes (Signed)
[facility] PREP Weekly Session  Patient Details  Name: POONAM WOEHRLE MRN: [id number] Date of Birth: Apr 17, 1961 Age: 61 y.o. PCP: Angelica Pou, MD  Vitals:   10/28/22 1300  Weight: 235 lb (106.6 kg)     [facility] Weekly seesion - 10/28/22 1500       [facility] "PREP" Location   [employer] [facility] [employer]      Weekly Session   Topic Discussed Health habits   sugar demo, importance of sleep   Minutes exercised this week --   not reported   Classes attended to date [place] 10/28/2022, 3:54 PM

## 2022-10-29 ENCOUNTER — Telehealth: Payer: Self-pay

## 2022-10-29 NOTE — Patient Outreach (Signed)
  Care Coordination   10/29/2022 Name: Jennifer [name] MRN: [id number] DOB: 29-Jul-1961   Care Coordination Outreach Attempts:  An unsuccessful telephone outreach was attempted today to offer the patient information about available care coordination services as a benefit of their health plan.   Follow Up Plan:  Additional outreach attempts will be made to offer the patient care coordination information and services.   Encounter Outcome:  No Answer  Care Coordination Interventions Activated:  No   Care Coordination Interventions:  No, not indicated    Lazaro Arms RN, BSN, [facility]   Phone: [phone]

## 2022-11-04 ENCOUNTER — Other Ambulatory Visit: Payer: Self-pay

## 2022-11-04 DIAGNOSIS — E1121 Type 2 diabetes mellitus with diabetic nephropathy: Secondary | ICD-10-CM

## 2022-11-04 MED ORDER — INSULIN PEN NEEDLE 32G X 4 MM MISC: 10 refills | Status: DC

## 2022-11-04 NOTE — Telephone Encounter (Signed)
Incoming fax from pharmacy  Please send rx to alternative pharmacy  [place], [place]

## 2022-11-04 NOTE — Progress Notes (Signed)
[facility] PREP Weekly Session  Patient Details  Name: Jennifer [name] MRN: [id number] Date of Birth: October 31, 1961 Age: 61 y.o. PCP: Angelica Pou, MD  Vitals:   11/04/22 1300  Weight: 239 lb 3.2 oz (108.5 kg)     [facility] Weekly seesion - 11/04/22 1600       [facility] "PREP" Location   [employer] [employer]      Weekly Session   Topic Discussed Restaurant Eating   Salt demo   Minutes exercised this week 480 minutes    Classes attended to date [place] 11/04/2022, 4:03 PM

## 2022-11-05 ENCOUNTER — Other Ambulatory Visit: Payer: Self-pay | Admitting: Internal Medicine

## 2022-11-05 DIAGNOSIS — J9611 Chronic respiratory failure with hypoxia: Secondary | ICD-10-CM | POA: Diagnosis not present

## 2022-11-05 DIAGNOSIS — Z89512 Acquired absence of left leg below knee: Secondary | ICD-10-CM | POA: Diagnosis not present

## 2022-11-05 DIAGNOSIS — J441 Chronic obstructive pulmonary disease with (acute) exacerbation: Secondary | ICD-10-CM | POA: Diagnosis not present

## 2022-11-05 DIAGNOSIS — J449 Chronic obstructive pulmonary disease, unspecified: Secondary | ICD-10-CM | POA: Diagnosis not present

## 2022-11-05 DIAGNOSIS — Z794 Long term (current) use of insulin: Secondary | ICD-10-CM

## 2022-11-05 DIAGNOSIS — E1159 Type 2 diabetes mellitus with other circulatory complications: Secondary | ICD-10-CM | POA: Diagnosis not present

## 2022-11-05 DIAGNOSIS — G8929 Other chronic pain: Secondary | ICD-10-CM

## 2022-11-05 NOTE — Telephone Encounter (Signed)
Last appointment 10/21/2022.  Next appointment 12/25/2022.  No [facility]ox[facility].  [facility] [address]

## 2022-11-05 NOTE — Telephone Encounter (Signed)
MED REFILL REQUEST  oxyCODONE (OXY IR/ROXICODONE) 5 MG immediate release tablet   Semaglutide, 1 MG/DOSE, (OZEMPIC, 1 MG/DOSE,) 4 MG/3ML SOPN   [facility] [place] [place] [facility] she is requesting medicine to be sent.

## 2022-11-06 MED ORDER — OZEMPIC (1 MG/DOSE) 4 MG/3ML ~~LOC~~ SOPN: 1.0000 mg | PEN_INJECTOR | SUBCUTANEOUS | 12 refills | Status: DC

## 2022-11-06 MED ORDER — OXYCODONE HCL 5 MG PO TABS: 5.0000 mg | ORAL_TABLET | Freq: Four times a day (QID) | ORAL | 0 refills | Status: DC | PRN

## 2022-11-26 ENCOUNTER — Telehealth: Payer: Self-pay

## 2022-11-26 NOTE — Telephone Encounter (Signed)
Called pt to check on missing PREP classes.  Female answered. Pt has the flu.  Encouraged rest and fluids and ask that she call me when she is feeling better.

## 2022-11-27 ENCOUNTER — Telehealth: Payer: Self-pay | Admitting: *Deleted

## 2022-11-27 NOTE — Telephone Encounter (Signed)
Call from patient  states has a cough.  Has been coughing for a week.  Coughing up yellow mucous. Using Cloricidin for folks who have high blood pressure.  Chills but no fevers.  Wants to come in for an appointment.  No available appointments.  No available appointments for tomorrow.. Has not taken a Covid Test.  States had Covid and this is not like it.  Son has had a Cold as well recently.  [facility] [address]

## 2022-11-28 NOTE — Telephone Encounter (Signed)
Patient calling back for her and her son. Patient is heard coughing continuously. Stated she took Covid test and it was negative. Has received Flu vaccine this season but not RSV vaccine. She is advised that they both go directly to ED. States she will go. Separate note placed in son's chart.

## 2022-12-01 ENCOUNTER — Other Ambulatory Visit: Payer: Self-pay | Admitting: Internal Medicine

## 2022-12-01 ENCOUNTER — Other Ambulatory Visit (HOSPITAL_COMMUNITY): Payer: Self-pay

## 2022-12-01 DIAGNOSIS — E118 Type 2 diabetes mellitus with unspecified complications: Secondary | ICD-10-CM

## 2022-12-01 MED ORDER — OZEMPIC (1 MG/DOSE) 4 MG/3ML ~~LOC~~ SOPN
1.0000 mg | PEN_INJECTOR | SUBCUTANEOUS | 12 refills | Status: DC
  Filled 2022-12-01: qty 3, 28d supply, fill #0
  Filled 2022-12-25: qty 3, 28d supply, fill #1

## 2022-12-01 NOTE — Telephone Encounter (Signed)
[facility] carries the med which is not available at her pharmacy. {

## 2022-12-01 NOTE — Telephone Encounter (Signed)
Pt requesting a nurse to call back about what to do because she has not been able to get the following medication for 2 months.  Pt states her Medication has been out of stock  Semaglutide, 1 MG/DOSE, (OZEMPIC, 1 MG/DOSE,) 4 MG/3ML SOPN  [id number] [place] [address]

## 2022-12-01 NOTE — Telephone Encounter (Signed)
RTC from patient would like for prescription for the [facility] sent to [facility] at [facility] as they have the medication available.

## 2022-12-01 NOTE — Telephone Encounter (Signed)
Spoke to patient states is still coughing white to yellow mucous.  Is not taking any medications for.. Refuses to go to the ER as was asked to do on Friday.  No fever.  Wants to be seen.  Given an appointment for Thursday morning for follow up of symptoms.  Patient also stated that she has not gotten her Semaglutide as her pharmacy has been out.  Patient was advised to check other pharmacies in her area to see if they have it in stock.

## 2022-12-03 ENCOUNTER — Other Ambulatory Visit (HOSPITAL_COMMUNITY): Payer: Self-pay

## 2022-12-04 ENCOUNTER — Telehealth: Payer: Self-pay | Admitting: *Deleted

## 2022-12-04 ENCOUNTER — Ambulatory Visit (INDEPENDENT_AMBULATORY_CARE_PROVIDER_SITE_OTHER): Payer: Medicare Other | Admitting: Internal Medicine

## 2022-12-04 ENCOUNTER — Encounter: Payer: Self-pay | Admitting: Internal Medicine

## 2022-12-04 VITALS — BP 158/90 | HR 69 | Temp 97.8°F | Ht 65.0 in | Wt 229.6 lb

## 2022-12-04 DIAGNOSIS — J069 Acute upper respiratory infection, unspecified: Secondary | ICD-10-CM

## 2022-12-04 DIAGNOSIS — K635 Polyp of colon: Secondary | ICD-10-CM | POA: Diagnosis not present

## 2022-12-04 DIAGNOSIS — M7541 Impingement syndrome of right shoulder: Secondary | ICD-10-CM

## 2022-12-04 DIAGNOSIS — E1151 Type 2 diabetes mellitus with diabetic peripheral angiopathy without gangrene: Secondary | ICD-10-CM | POA: Diagnosis not present

## 2022-12-04 DIAGNOSIS — I152 Hypertension secondary to endocrine disorders: Secondary | ICD-10-CM | POA: Diagnosis not present

## 2022-12-04 DIAGNOSIS — M25511 Pain in right shoulder: Secondary | ICD-10-CM | POA: Diagnosis not present

## 2022-12-04 DIAGNOSIS — F4321 Adjustment disorder with depressed mood: Secondary | ICD-10-CM

## 2022-12-04 DIAGNOSIS — E118 Type 2 diabetes mellitus with unspecified complications: Secondary | ICD-10-CM

## 2022-12-04 DIAGNOSIS — I739 Peripheral vascular disease, unspecified: Secondary | ICD-10-CM

## 2022-12-04 DIAGNOSIS — E1159 Type 2 diabetes mellitus with other circulatory complications: Secondary | ICD-10-CM | POA: Diagnosis not present

## 2022-12-04 DIAGNOSIS — G8929 Other chronic pain: Secondary | ICD-10-CM

## 2022-12-04 DIAGNOSIS — R112 Nausea with vomiting, unspecified: Secondary | ICD-10-CM

## 2022-12-04 DIAGNOSIS — Z794 Long term (current) use of insulin: Secondary | ICD-10-CM

## 2022-12-04 DIAGNOSIS — I872 Venous insufficiency (chronic) (peripheral): Secondary | ICD-10-CM | POA: Diagnosis not present

## 2022-12-04 DIAGNOSIS — M5416 Radiculopathy, lumbar region: Secondary | ICD-10-CM

## 2022-12-04 DIAGNOSIS — Z6379 Other stressful life events affecting family and household: Secondary | ICD-10-CM

## 2022-12-04 DIAGNOSIS — F332 Major depressive disorder, recurrent severe without psychotic features: Secondary | ICD-10-CM

## 2022-12-04 DIAGNOSIS — Z7984 Long term (current) use of oral hypoglycemic drugs: Secondary | ICD-10-CM

## 2022-12-04 DIAGNOSIS — N3946 Mixed incontinence: Secondary | ICD-10-CM

## 2022-12-04 DIAGNOSIS — Z634 Disappearance and death of family member: Secondary | ICD-10-CM

## 2022-12-04 DIAGNOSIS — I499 Cardiac arrhythmia, unspecified: Secondary | ICD-10-CM | POA: Diagnosis not present

## 2022-12-04 MED ORDER — ONDANSETRON HCL 4 MG PO TABS: 4.0000 mg | ORAL_TABLET | Freq: Three times a day (TID) | ORAL | 1 refills | Status: AC | PRN

## 2022-12-04 NOTE — Assessment & Plan Note (Signed)
Improving symptoms.  Cough prod of clear sputum.  Chest with scattered rhonchi and wheeze which is not unusual for her.  reassurance given.

## 2022-12-04 NOTE — Assessment & Plan Note (Signed)
Severe, trial of Gemtesa/vibegron 75 mg daily in addition to her oxybutynin and myrbetriq was unsuccessful, it was her understanding that Dr. Gloriann Loan was referring her to a different specialist but she doesn't know details.  I'll request records for [facility].  May need to send her to a urogynecologist for surgical options.

## 2022-12-04 NOTE — Assessment & Plan Note (Signed)
Pain worsening (see last entry).  Impaired mobility interfering with ADLs.  Will refer to sports med clinic for further opinion on non-surgical options before moving on to ortho.

## 2022-12-04 NOTE — Assessment & Plan Note (Signed)
Self referred to a local neurosurgeon with appt scheduled for first of year.  Out of network apparently.  She is hopeful for a surgical option for her chronic severe mobility-limiting back pain.

## 2022-12-04 NOTE — Assessment & Plan Note (Signed)
Heart RRR today.  Continue to monitor.

## 2022-12-04 NOTE — Progress Notes (Signed)
Stressed due to frought relationship with son, who is chronically ill with some new dependencies and who is reacting very inappropriately to her.  Visit today to eval a cold, which is better actually, but she wanted to come to get out of the house. Coughing up phlegm, orange color (is it because you're drinking orange soda? "Probably").    UI is a constant burden and interferes with QOL. Can't hold urine.Had a couple of good night recently due to cutting down on fluids.  Tries to to stop fluid intake around 8-9p, goes to sleep 1-2 am, awakens early as well, no napping, can't sleep well due to nocturia.  Urologist care hasn't been successful, she stopped going.  She relates that urologist suggested a nocturnal catheter which she would have to place.  Urology had prescribed Gemtesa/vibegron 75 mg daily in addition to her oxybutynin and myrbetriq;  trial unsuccessful, the prescription has run out and she doesn't plan to refill given lack of benefit.  Was unable to attend f/u due to conflict in scheduling.  Dr. Gloriann Loan sent her to another doctor, she doesn't know details as she hadn't followed up.    Feels nauseated, hasn't had vomiting "I feel so sick".  Feeling some pain in her abdomen.  This is not uncommon for her.  Made appt with a spinal surgeon, who did surgery on her son recently.  She will see him on 01/07/22 about her spine.  "I'm tired of hurting" since 2007.  Can't stand for long, can't walk without assistance.  I can't see a pending appts, out of network?   "My R shoulder hurts again", 2 prior rotator cuff repairs, now can't lift above head, difficult to dress, do ADLs, etc.  Out of Ozempic for 3 months, her pharmacy had no supply, finally found some at [facility] at [facility]. Just started taking it again.  Exam: BP (!) 158/90 (BP Location: Left Arm, Patient Position: Sitting, Cuff Size: Small)   Pulse 69   Temp 97.8 F (36.6 C) (Oral)   Ht '5\' 5"'$  (1.651 m)   Wt 229 lb 9.6 oz (104.1 kg)    SpO2 99%   BMI 38.21 kg/m  Tearful, tired-appearing.  Lungs with scattered wheezes and rhonchi, good airflow, non labored.  Cough prod of clear sputum.  Heart RRR.  RLE with compression stocking in place.  2-3# edema to knee.  Foot warm with 2+ dp.  Ray amputation surgical site completely healed.  Toes and distal foot not sensate.  Small callus distal great toe.  Callus at plantar/medial 1st MTP.  Leg below knee with hyperpigmented round scars from her former leg rash.  There remains a pink blush to the skin without increased heat.  Demonstrates restriction in ROM of R shoulder - able to raise it < 90 degrees.  Tender over [facility] joint line and actually about the entire joint, no effusion noted.  Assessment and Plan:   Ms. Douglass is experiencing difficult stress which is negatively affecting her mood.  Her many chronic medical conditions make daily life uncomfortable (severe URI, chronic pain right shoulder and back, poor sleep, among a host of others) are enough to weigh her down without the added challenge of living with a difficult son.  Today was an acute visit to follow-up her cough, but until we get together in 2 weeks for her chronic condition visit, she will be referred to sports medicine and to behavioral health counseling to get the wheels in motion.  No changes today.  Will address all of her care gaps at next month's visit.  The below are not in any particular order:  Hypertension associated with diabetes ([place]) Uncontrolled 158/90 though emotionally distraught today.  Recheck in 2 weeks at next visit.  Peripheral arterial disease with history of revascularization (HCC) R dp pulse palpable, foot warm, no rest or exertional ischemic pain.  Monitor.  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  2-3+ chronic LE edema from foot to knee is unchanged.  No open areas. Monitor.    Hyperplastic colon  polyp Referral placed though her son passed away in the interim and she has not had the wherewithall to attend may of these prevention visits.  We'll keep working toward it.    Diabetes mellitus type 2, controlled, with complications ([place]) Recently resumed semaglutide after 37M of being without it (not in stock).  Today was acute visit, will check A1C 12/25/22.  Foot exam done today with no new concerns.    Irregularly irregular heart rhythm Heart RRR today.  Continue to monitor.  Depression, major, severe recurrence (HCC) Mood is low, she is tearful.  Approaching holidays without her son who passed away this year is very difficult, compounded by a strained relationship with her other son with whom she lives-and general sadness about her poor health.  She agrees to referral to [facility] for talk therapy.  Urinary incontinence, mixed, urge/stress/functional Severe, trial of Gemtesa/vibegron 75 mg daily in addition to her oxybutynin and myrbetriq was unsuccessful, it was her understanding that Dr. Gloriann Loan was referring her to a different specialist but she doesn't know details.  I'll request records for [facility].  May need to send her to a urogynecologist for surgical options.    Chronic back pain due to lumbar radiculopathy Self referred to a local neurosurgeon with appt scheduled for first of year.  Out of network apparently.  She is hopeful for a surgical option for her chronic severe mobility-limiting back pain.    Chronic right shoulder pain Pain worsening (see last entry).  Impaired mobility interfering with ADLs.  Will refer to sports med clinic for further opinion on non-surgical options before moving on to ortho.  Viral upper respiratory tract infection with cough Improving symptoms.  Cough prod of clear sputum.  Chest with scattered rhonchi and wheeze which is not unusual for her.  reassurance given.

## 2022-12-04 NOTE — Assessment & Plan Note (Signed)
Uncontrolled 158/90 though emotionally distraught today.  Recheck in 2 weeks at next visit.

## 2022-12-04 NOTE — Assessment & Plan Note (Signed)
Wearing compression stockings inconsistently but they are in place today.  2-3+ chronic LE edema from foot to knee is unchanged.  No open areas. Monitor.

## 2022-12-04 NOTE — Assessment & Plan Note (Signed)
Referral placed though her son passed away in the interim and she has not had the wherewithall to attend may of these prevention visits.  We'll keep working toward it.

## 2022-12-04 NOTE — Assessment & Plan Note (Signed)
Recently resumed semaglutide after 56M of being without it (not in stock).  Today was acute visit, will check A1C 12/25/22.  Foot exam done today with no new concerns.

## 2022-12-04 NOTE — Assessment & Plan Note (Signed)
Mood is low, she is tearful.  Approaching holidays without her son who passed away this year is very difficult, compounded by a strained relationship with her other son with whom she lives-and general sadness about her poor health.  She agrees to referral to [facility] for talk therapy.

## 2022-12-04 NOTE — Telephone Encounter (Signed)
Patient called in stating she just got home and has already vomited 4 times. Only food she has eaten today was graham crackers that were given in our office. Yesterday she had Meals on Wheels ([place], dressing, and stewed apples).  She is keeping down sips of water. [facility] [address]

## 2022-12-04 NOTE — Assessment & Plan Note (Signed)
R dp pulse palpable, foot warm, no rest or exertional ischemic pain.  Monitor.

## 2022-12-05 ENCOUNTER — Other Ambulatory Visit: Payer: Self-pay | Admitting: *Deleted

## 2022-12-05 DIAGNOSIS — M62838 Other muscle spasm: Secondary | ICD-10-CM

## 2022-12-05 DIAGNOSIS — E1159 Type 2 diabetes mellitus with other circulatory complications: Secondary | ICD-10-CM | POA: Diagnosis not present

## 2022-12-05 DIAGNOSIS — J9611 Chronic respiratory failure with hypoxia: Secondary | ICD-10-CM | POA: Diagnosis not present

## 2022-12-05 DIAGNOSIS — J449 Chronic obstructive pulmonary disease, unspecified: Secondary | ICD-10-CM | POA: Diagnosis not present

## 2022-12-05 DIAGNOSIS — J441 Chronic obstructive pulmonary disease with (acute) exacerbation: Secondary | ICD-10-CM | POA: Diagnosis not present

## 2022-12-05 DIAGNOSIS — Z89512 Acquired absence of left leg below knee: Secondary | ICD-10-CM | POA: Diagnosis not present

## 2022-12-05 DIAGNOSIS — G8929 Other chronic pain: Secondary | ICD-10-CM

## 2022-12-05 MED ORDER — BACLOFEN 10 MG PO TABS: 5.0000 mg | ORAL_TABLET | Freq: Two times a day (BID) | ORAL | 0 refills | Status: DC

## 2022-12-05 MED ORDER — OXYCODONE HCL 5 MG PO TABS: 5.0000 mg | ORAL_TABLET | Freq: Four times a day (QID) | ORAL | 0 refills | Status: DC | PRN

## 2022-12-05 NOTE — Telephone Encounter (Signed)
Pt's calling back stating she's having very bad muscle spasms in her legs, feet, and hands.Baclofen is on the current med list.

## 2022-12-05 NOTE — Telephone Encounter (Signed)
Pt called - informed [facility] [address].

## 2022-12-05 NOTE — Telephone Encounter (Signed)
Last rx written  11/06/22. Last OV12/14/23. Next OV 12/25/21. Tox 06/16/19.

## 2022-12-08 ENCOUNTER — Other Ambulatory Visit: Payer: Self-pay

## 2022-12-08 DIAGNOSIS — N3946 Mixed incontinence: Secondary | ICD-10-CM

## 2022-12-10 ENCOUNTER — Other Ambulatory Visit: Payer: Self-pay

## 2022-12-10 DIAGNOSIS — N3946 Mixed incontinence: Secondary | ICD-10-CM

## 2022-12-11 ENCOUNTER — Ambulatory Visit: Payer: Medicare Other | Admitting: Sports Medicine

## 2022-12-11 MED ORDER — MYRBETRIQ 50 MG PO TB24: 50.0000 mg | ORAL_TABLET | Freq: Every day | ORAL | 3 refills | Status: DC

## 2022-12-11 NOTE — Telephone Encounter (Signed)
Wrong pharmacy

## 2022-12-24 ENCOUNTER — Ambulatory Visit: Payer: Medicare Other | Admitting: *Deleted

## 2022-12-24 DIAGNOSIS — Z89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): Secondary | ICD-10-CM

## 2022-12-24 DIAGNOSIS — E0843 Diabetes mellitus due to underlying condition with diabetic autonomic (poly)neuropathy: Secondary | ICD-10-CM

## 2022-12-24 NOTE — Progress Notes (Signed)
Patient presents to the office today for diabetic shoe and insole measuring.  Patient was measured with brannock device to determine size and width for 1 pair of extra depth shoes and foam casted for 3 pair of insoles.   ABN signed.   Documentation of medical necessity will be sent to patient's treating diabetic doctor to verify and sign.   Patient's diabetic provider: Angelica Pou, MD   Shoes and insoles will be ordered at that time and patient will be notified for an appointment for fitting when they arrive.   Brannock measurement: 10 WIDE  Patient shoe selection-   1st   Shoe choice:   [id number]   Shoe size ordered: 10.5 WIDE

## 2022-12-24 NOTE — Progress Notes (Signed)
[name] is here for routine follow-up of her multiple chronic conditions, though our visit was cut short as I was running behind and she was scheduled for a sports medicine evaluation right after our appointment.  She reports that she is doing "okay" with nothing emergent, though is frustrated by bedbug bites that she is experiencing (though her 64 year old chronically ill son, who shares a bed with her, is not experiencing).  She was recently fit for custom diabetic shoe which is on order.  She has not been monitoring her blood sugars as her monitoring device is not working, nor is her phone.  "I do not know what is going on".  At last visit, [name] was experiencing extreme stress and grief and I had intended to place a referral to LCSW therapist Jennifer [name] which I unfortunately didn't complete until just recently (order placed as a [facility] ACO care coordination referral for LCSW for counseling).  She got through the holidays "ok, but it was hard".   BP (!) 136/55 (BP Location: Right Arm, Patient Position: Sitting, Cuff Size: Normal)   Pulse 72   Temp 98.3 F (36.8 C) (Oral)   Ht '5\' 5"'$  (1.651 m)   Wt 227 lb 6.4 oz (103.1 kg)   SpO2 100%   BMI 37.84 kg/m  Spry and energetic today, mood is stable and positive.  RLE with tense edema to foot.  Multiple discrete red 0.5-1.0-ish cm red lesions in various stages of excoriations and healing extend from leg up her torso and are visible on her exposed wrists.  No surrounding redness or increased warmth. No active burns of hands.  L 2nd finger with a small nodule overlying the extensor tendon between MCP and PIP which doesn't interfere with finger mobility and is nontender.  Assessment and plan: Visit cut short today - she will f/u soon to address her many issues.  Tobacco abuse Screening CT lungs ordered  Healthcare maintenance Needs: overdue colonoscopy (ball is in her court to call and schedule), CT lung cancer screening (ordered, expires 03/2023),  eye exam.  Skin lesions Bedbug bites are suspect.  She is aware that they may exist and cockroaches are frequent visitors.  She really has no help at home.  This is not a new problem.  No insects visible on her person or belongings or in the exam room today.

## 2022-12-24 NOTE — Assessment & Plan Note (Signed)
Screening CT lungs ordered

## 2022-12-25 ENCOUNTER — Other Ambulatory Visit (HOSPITAL_COMMUNITY): Payer: Self-pay

## 2022-12-25 ENCOUNTER — Ambulatory Visit: Payer: Medicare Other | Admitting: Podiatry

## 2022-12-25 ENCOUNTER — Ambulatory Visit (INDEPENDENT_AMBULATORY_CARE_PROVIDER_SITE_OTHER): Payer: Medicare Other | Admitting: Internal Medicine

## 2022-12-25 ENCOUNTER — Encounter: Payer: Self-pay | Admitting: Internal Medicine

## 2022-12-25 ENCOUNTER — Ambulatory Visit (INDEPENDENT_AMBULATORY_CARE_PROVIDER_SITE_OTHER): Payer: Medicare Other | Admitting: Sports Medicine

## 2022-12-25 ENCOUNTER — Other Ambulatory Visit: Payer: Self-pay

## 2022-12-25 VITALS — BP 153/60 | Ht 65.0 in | Wt 227.0 lb

## 2022-12-25 VITALS — BP 136/55 | HR 72 | Temp 98.3°F | Ht 65.0 in | Wt 227.4 lb

## 2022-12-25 DIAGNOSIS — E118 Type 2 diabetes mellitus with unspecified complications: Secondary | ICD-10-CM

## 2022-12-25 DIAGNOSIS — G8929 Other chronic pain: Secondary | ICD-10-CM | POA: Diagnosis not present

## 2022-12-25 DIAGNOSIS — I5032 Chronic diastolic (congestive) heart failure: Secondary | ICD-10-CM | POA: Diagnosis not present

## 2022-12-25 DIAGNOSIS — N3946 Mixed incontinence: Secondary | ICD-10-CM

## 2022-12-25 DIAGNOSIS — F17210 Nicotine dependence, cigarettes, uncomplicated: Secondary | ICD-10-CM | POA: Diagnosis not present

## 2022-12-25 DIAGNOSIS — Z634 Disappearance and death of family member: Secondary | ICD-10-CM

## 2022-12-25 DIAGNOSIS — F332 Major depressive disorder, recurrent severe without psychotic features: Secondary | ICD-10-CM

## 2022-12-25 DIAGNOSIS — L989 Disorder of the skin and subcutaneous tissue, unspecified: Secondary | ICD-10-CM

## 2022-12-25 DIAGNOSIS — M25511 Pain in right shoulder: Secondary | ICD-10-CM | POA: Diagnosis not present

## 2022-12-25 DIAGNOSIS — Z794 Long term (current) use of insulin: Secondary | ICD-10-CM

## 2022-12-25 DIAGNOSIS — Z6379 Other stressful life events affecting family and household: Secondary | ICD-10-CM | POA: Diagnosis not present

## 2022-12-25 DIAGNOSIS — Z720 Tobacco use: Secondary | ICD-10-CM

## 2022-12-25 DIAGNOSIS — F4321 Adjustment disorder with depressed mood: Secondary | ICD-10-CM | POA: Diagnosis not present

## 2022-12-25 DIAGNOSIS — Z0000 Encounter for general adult medical examination without abnormal findings: Secondary | ICD-10-CM

## 2022-12-25 LAB — POCT GLYCOSYLATED HEMOGLOBIN (HGB A1C): Hemoglobin A1C: 8.3 % — AB (ref 4.0–5.6)

## 2022-12-25 LAB — GLUCOSE, CAPILLARY: Glucose-Capillary: 177 mg/dL — ABNORMAL HIGH (ref 70–99)

## 2022-12-25 MED ORDER — OZEMPIC (1 MG/DOSE) 4 MG/3ML ~~LOC~~ SOPN
1.0000 mg | PEN_INJECTOR | SUBCUTANEOUS | 12 refills | Status: DC
  Filled 2022-12-25 – 2023-01-28 (×2): qty 3, 28d supply, fill #0
  Filled 2023-02-26: qty 3, 28d supply, fill #1
  Filled 2023-04-01: qty 3, 28d supply, fill #0
  Filled 2023-05-07: qty 3, 28d supply, fill #1
  Filled 2023-06-09: qty 3, 28d supply, fill #2
  Filled 2023-07-06: qty 3, 28d supply, fill #3

## 2022-12-25 NOTE — Telephone Encounter (Signed)
Filled two weeks ago

## 2022-12-25 NOTE — Progress Notes (Addendum)
New Patient Office Visit  Subjective   [name], female    DOB: 05-07-1961  Age: 62 y.o. MRN: [id number]  CC: Right shoulder pain   HPI: Jennifer [name] is a 62 year old female with a history of multiple prior rotator cuff tears who presents with right shoulder pain. She most recently underwent right shoulder arthroscopy and debridement of a SLAP lesion and partial rotator cuff tear with subacromial decompression in 10/2019 with Dr. Sharol Given.  Today Jennifer [name] states that she has had right shoulder pain for about 6 months, and she believes it was precipitated by a fall. She uses a walker to ambulate and falls often, citing 5 falls in December where she landed on her shoulder. She lives with her son but he just underwent spinal surgery so has not been able to help her when she falls.      Objective:     BP (!) 153/60 (BP Location: Right Arm)   Ht '5\' 5"'$  (1.651 m)   Wt 227 lb (103 kg)   BMI 37.77 kg/m   Vitals reviewed.   Physical Exam Gen: Pleasant elderly female. Using walker to ambulate. Left sided BKA.   R shoulder No swelling or bruises noted. Tender to palpation of entire shoulder joint, maximum tenderness around scapular spine. Limited abduction to approx 90 degrees. Limited passive external rotation compared to left shoulder. Positive empty can test. Positive cross-arm adduction test.    Results for orders placed or performed in visit on 12/25/22  Glucose, capillary  Result Value Ref Range   Glucose-Capillary 177 (H) 70 - 99 mg/dL  POC Hbg A1C  Result Value Ref Range   Hemoglobin A1C 8.3 (A) 4.0 - 5.6 %   HbA1c POC (<> result, manual entry)     HbA1c, POC (prediabetic range)     HbA1c, POC (controlled diabetic range)        Assessment & Plan:   Problem List Items Addressed This Visit       Other   Chronic right shoulder pain - Primary (Chronic)    Pain worsening, limiting shoulder function. Her limited passive external rotation on exam may represent  adhesive capsulitis vs rotator cuff arthropathy causing degenerative changes. Will obtain right shoulder XR to better visualize degree of glenohumeral OA. If there is severe OA suggesting worsening rotator cuff arthropathy, she may require operative intervention which is accomplished by reverse total shoulder replacement. Adhesive capsulitis can be treated with PT and corticosteroid injections.  - Follow-up x-ray results and call to determine next steps       Relevant Orders   DG Shoulder Right    No follow-ups on file.    Stefani Dama, Medical Student   Patient seen and evaluated with the medical student.  I agree with the above plan of care.  I reviewed the operative note from her last rotator cuff surgery.  Although the preoperative MRI showed a full-thickness rotator cuff tear, the operative note suggest that debridement was necessary instead of a repair.  Patient has limited passive external rotation which has me concern for glenohumeral osteoarthritis.  I would like to start with getting an x-ray of her shoulder and I will call her once I have reviewed that study.  We may schedule her for a glenohumeral injection if the x-ray confirms arthritis.  This note was dictated using Dragon naturally speaking software and may contain errors in syntax, spelling, or content which have not been identified prior to signing this  note.

## 2022-12-25 NOTE — Assessment & Plan Note (Addendum)
Pain worsening, limiting shoulder function. Her limited passive external rotation on exam may represent adhesive capsulitis vs rotator cuff arthropathy causing degenerative changes. Will obtain right shoulder XR to better visualize degree of glenohumeral OA. If there is severe OA suggesting worsening rotator cuff arthropathy, she may require operative intervention which is accomplished by reverse total shoulder replacement. Adhesive capsulitis can be treated with PT and corticosteroid injections.  - Follow-up x-ray results and call to determine next steps

## 2022-12-25 NOTE — Assessment & Plan Note (Signed)
Bedbug bites are suspect.  She is aware that they may exist and cockroaches are frequent visitors.  She really has no help at home.  This is not a new problem.  No insects visible on her person or belongings or in the exam room today.

## 2022-12-25 NOTE — Assessment & Plan Note (Signed)
Needs: overdue colonoscopy (ball is in her court to call and schedule), CT lung cancer screening (ordered, expires 03/2023), eye exam.

## 2022-12-29 ENCOUNTER — Other Ambulatory Visit: Payer: Self-pay | Admitting: Internal Medicine

## 2022-12-30 ENCOUNTER — Telehealth: Payer: Self-pay | Admitting: *Deleted

## 2022-12-30 NOTE — Telephone Encounter (Signed)
Patient has an appointment with Dr. Amalia Hailey tomorrow.

## 2022-12-30 NOTE — Telephone Encounter (Signed)
Patient is calling because the place on her great toe has swollen, blistered , filled with blood within a day,

## 2022-12-30 NOTE — Telephone Encounter (Signed)
Call from patient c/o of swollen great Toe Right foot.  States toe is full of fluid like a bubble of Pus..  Saw today.  Goes from upper to lower Toe.  Offered an appointment for today.  Unable to get here.  Given an appointment for tomorrow at 1:15 PM.

## 2022-12-31 ENCOUNTER — Ambulatory Visit: Payer: Medicare Other | Admitting: Podiatry

## 2022-12-31 ENCOUNTER — Ambulatory Visit (INDEPENDENT_AMBULATORY_CARE_PROVIDER_SITE_OTHER): Payer: Medicare Other | Admitting: Student

## 2022-12-31 VITALS — BP 125/62 | HR 71 | Temp 97.9°F | Wt 227.3 lb

## 2022-12-31 DIAGNOSIS — T300 Burn of unspecified body region, unspecified degree: Secondary | ICD-10-CM | POA: Diagnosis present

## 2022-12-31 DIAGNOSIS — S90424A Blister (nonthermal), right lesser toe(s), initial encounter: Secondary | ICD-10-CM | POA: Diagnosis not present

## 2022-12-31 DIAGNOSIS — M79661 Pain in right lower leg: Secondary | ICD-10-CM | POA: Diagnosis not present

## 2022-12-31 MED ORDER — SULFAMETHOXAZOLE-TRIMETHOPRIM 800-160 MG PO TABS: 1.0000 | ORAL_TABLET | Freq: Two times a day (BID) | ORAL | 0 refills | Status: DC
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
## 2022-12-31 NOTE — Patient Instructions (Signed)
Return if symptoms worsen or fail to improve, for follow up of wound on right great toe.   Expect a call from the following department(s):  Referral Orders         AMB referral to wound care center     Please call our clinic at [phone] Monday through Friday from 9 am to 4 pm if you have questions or concerns about your health. If after hours or on the weekend, call the main hospital number and ask for the Internal Medicine Resident On-Call. If you need medication refills, please notify your pharmacy one week in advance and they will send [place] a request.   Best, Nani Gasser, [place]

## 2022-12-31 NOTE — Assessment & Plan Note (Addendum)
Tense, painful blister on right great toe that was first noticed on Sunday.  This patient thinks she may have fallen asleep near heater causing a burn.  There does not appear to be a pus collection under the skin.  I do see what could be some erythema and some warmth on the lower leg, possibly representing cellulitis.  Right dorsalis pedis pulse intact.  I do not recommend drainage at this time as this wound does not look pus filled or like an abscess is forming.  I fear when the blister bursts it will put this patient at increased risk for infection if it is not already present.  Will start Bactrim for skin and soft tissue infection of the right leg as patient has had a history of MRSA bacteremia in the past.  Will also refer to wound care for help with this right leg wound.

## 2022-12-31 NOTE — Assessment & Plan Note (Signed)
>>  ASSESSMENT AND PLAN FOR BLISTER OF TOE OF RIGHT FOOT WRITTEN ON 12/31/2022  5:17 PM BY MCLENDON, MICHAEL, MD  Tense, painful blister on right great toe that was first noticed on Sunday.  This patient thinks she may have fallen asleep near heater causing a burn.  There does not appear to be a pus collection under the skin.  I do see what could be some erythema and some warmth on the lower leg, possibly representing cellulitis.  Right dorsalis pedis pulse intact.  I do not recommend drainage at this time as this wound does not look pus filled or like an abscess is forming.  I fear when the blister bursts it will put this patient at increased risk for infection if it is not already present.  Will start Bactrim for skin and soft tissue infection of the right leg as patient has had a history of MRSA bacteremia in the past.  Will also refer to wound care for help with this right leg wound.

## 2022-12-31 NOTE — Assessment & Plan Note (Signed)
May represent spreading cellulitis although the calf seems to be spared from the warmth and erythema that I note on her anterior leg.  Review of systems negative for chest pain, shortness of breath, new or worsening cough.  I have some concern for DVT, thus I will order lower extremity venous ultrasound for the right leg.

## 2023-01-01 ENCOUNTER — Other Ambulatory Visit: Payer: Self-pay | Admitting: Internal Medicine

## 2023-01-01 DIAGNOSIS — G8929 Other chronic pain: Secondary | ICD-10-CM

## 2023-01-01 NOTE — Progress Notes (Signed)
Internal Medicine Clinic Attending  Case discussed with Dr. McLendon  At the time of the visit.  We reviewed the resident's history and exam and pertinent patient test results.  I agree with the assessment, diagnosis, and plan of care documented in the resident's note.  

## 2023-01-01 NOTE — Telephone Encounter (Signed)
Pt seen 12/31/2022 and is f/u with the following medication refill request:  oxyCODONE (OXY IR/ROXICODONE) 5 MG immediate release tablet   [id number] [place], [place] - [address]

## 2023-01-01 NOTE — Addendum Note (Signed)
Addended by: Jodean Lima on: 01/01/2023 09:27 AM   Modules accepted: Level of Service

## 2023-01-02 MED ORDER — OXYCODONE HCL 5 MG PO TABS: 5.0000 mg | ORAL_TABLET | Freq: Four times a day (QID) | ORAL | 0 refills | Status: DC | PRN

## 2023-01-05 DIAGNOSIS — Z89512 Acquired absence of left leg below knee: Secondary | ICD-10-CM | POA: Diagnosis not present

## 2023-01-05 DIAGNOSIS — J9611 Chronic respiratory failure with hypoxia: Secondary | ICD-10-CM | POA: Diagnosis not present

## 2023-01-05 DIAGNOSIS — E1159 Type 2 diabetes mellitus with other circulatory complications: Secondary | ICD-10-CM | POA: Diagnosis not present

## 2023-01-05 DIAGNOSIS — J449 Chronic obstructive pulmonary disease, unspecified: Secondary | ICD-10-CM | POA: Diagnosis not present

## 2023-01-05 DIAGNOSIS — J441 Chronic obstructive pulmonary disease with (acute) exacerbation: Secondary | ICD-10-CM | POA: Diagnosis not present

## 2023-01-06 ENCOUNTER — Other Ambulatory Visit: Payer: Self-pay

## 2023-01-06 DIAGNOSIS — N3946 Mixed incontinence: Secondary | ICD-10-CM

## 2023-01-07 DIAGNOSIS — M47816 Spondylosis without myelopathy or radiculopathy, lumbar region: Secondary | ICD-10-CM | POA: Diagnosis not present

## 2023-01-12 ENCOUNTER — Ambulatory Visit (INDEPENDENT_AMBULATORY_CARE_PROVIDER_SITE_OTHER): Payer: 59 | Admitting: Podiatry

## 2023-01-12 ENCOUNTER — Encounter: Payer: Self-pay | Admitting: Podiatry

## 2023-01-12 ENCOUNTER — Other Ambulatory Visit: Payer: Self-pay

## 2023-01-12 DIAGNOSIS — L97512 Non-pressure chronic ulcer of other part of right foot with fat layer exposed: Secondary | ICD-10-CM

## 2023-01-12 DIAGNOSIS — N3946 Mixed incontinence: Secondary | ICD-10-CM

## 2023-01-12 DIAGNOSIS — Z89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): Secondary | ICD-10-CM

## 2023-01-12 DIAGNOSIS — E08621 Diabetes mellitus due to underlying condition with foot ulcer: Secondary | ICD-10-CM

## 2023-01-12 DIAGNOSIS — E1142 Type 2 diabetes mellitus with diabetic polyneuropathy: Secondary | ICD-10-CM

## 2023-01-12 MED ORDER — MYRBETRIQ 50 MG PO TB24: 50.0000 mg | ORAL_TABLET | Freq: Every day | ORAL | 3 refills | Status: DC
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
## 2023-01-15 ENCOUNTER — Other Ambulatory Visit: Payer: Self-pay | Admitting: Podiatry

## 2023-01-15 ENCOUNTER — Telehealth: Payer: Self-pay

## 2023-01-15 DIAGNOSIS — E08621 Diabetes mellitus due to underlying condition with foot ulcer: Secondary | ICD-10-CM

## 2023-01-15 MED ORDER — POVIDONE-IODINE 10 % EX SOLN: 1.0000 | CUTANEOUS | 0 refills | Status: DC | PRN

## 2023-01-15 NOTE — Progress Notes (Signed)
PREP start 10/07/22 final date 01/13/23 Attended 6 total sessions Did not return for final measurements.

## 2023-01-15 NOTE — Telephone Encounter (Signed)
I sent in an [facility] [address]

## 2023-01-16 DIAGNOSIS — E11622 Type 2 diabetes mellitus with other skin ulcer: Secondary | ICD-10-CM | POA: Diagnosis not present

## 2023-01-19 DIAGNOSIS — Z961 Presence of intraocular lens: Secondary | ICD-10-CM | POA: Diagnosis not present

## 2023-01-19 DIAGNOSIS — E119 Type 2 diabetes mellitus without complications: Secondary | ICD-10-CM | POA: Diagnosis not present

## 2023-01-19 DIAGNOSIS — H40023 Open angle with borderline findings, high risk, bilateral: Secondary | ICD-10-CM | POA: Diagnosis not present

## 2023-01-19 DIAGNOSIS — H5010 Unspecified exotropia: Secondary | ICD-10-CM | POA: Diagnosis not present

## 2023-01-19 LAB — HM DIABETES EYE EXAM

## 2023-01-23 ENCOUNTER — Encounter (HOSPITAL_BASED_OUTPATIENT_CLINIC_OR_DEPARTMENT_OTHER): Payer: Medicare Other | Admitting: Internal Medicine

## 2023-01-26 ENCOUNTER — Ambulatory Visit (HOSPITAL_COMMUNITY)
Admission: RE | Admit: 2023-01-26 | Discharge: 2023-01-26 | Disposition: A | Discharge status: Home / Self Care | Payer: 59 | Source: Ambulatory Visit | Attending: Internal Medicine | Admitting: Internal Medicine

## 2023-01-26 ENCOUNTER — Other Ambulatory Visit: Payer: Self-pay

## 2023-01-26 DIAGNOSIS — Z720 Tobacco use: Secondary | ICD-10-CM | POA: Diagnosis present

## 2023-01-26 DIAGNOSIS — E119 Type 2 diabetes mellitus without complications: Secondary | ICD-10-CM

## 2023-01-26 DIAGNOSIS — F17210 Nicotine dependence, cigarettes, uncomplicated: Secondary | ICD-10-CM | POA: Insufficient documentation

## 2023-01-26 DIAGNOSIS — I700 Atherosclerosis of aorta: Secondary | ICD-10-CM | POA: Insufficient documentation

## 2023-01-26 DIAGNOSIS — I2510 Atherosclerotic heart disease of native coronary artery without angina pectoris: Secondary | ICD-10-CM | POA: Diagnosis not present

## 2023-01-26 DIAGNOSIS — Z122 Encounter for screening for malignant neoplasm of respiratory organs: Secondary | ICD-10-CM | POA: Diagnosis not present

## 2023-01-26 DIAGNOSIS — J439 Emphysema, unspecified: Secondary | ICD-10-CM | POA: Diagnosis not present

## 2023-01-26 MED ORDER — TRESIBA FLEXTOUCH 200 UNIT/ML ~~LOC~~ SOPN: PEN_INJECTOR | SUBCUTANEOUS | 3 refills | Status: DC

## 2023-01-27 ENCOUNTER — Encounter: Payer: Self-pay | Admitting: Podiatry

## 2023-01-27 ENCOUNTER — Ambulatory Visit (INDEPENDENT_AMBULATORY_CARE_PROVIDER_SITE_OTHER): Payer: 59 | Admitting: Podiatry

## 2023-01-27 VITALS — BP 164/77 | HR 83 | Temp 98.6°F

## 2023-01-27 DIAGNOSIS — E1142 Type 2 diabetes mellitus with diabetic polyneuropathy: Secondary | ICD-10-CM

## 2023-01-27 DIAGNOSIS — E08621 Diabetes mellitus due to underlying condition with foot ulcer: Secondary | ICD-10-CM | POA: Diagnosis not present

## 2023-01-27 DIAGNOSIS — L97512 Non-pressure chronic ulcer of other part of right foot with fat layer exposed: Secondary | ICD-10-CM | POA: Diagnosis not present

## 2023-01-27 DIAGNOSIS — Z9889 Other specified postprocedural states: Secondary | ICD-10-CM

## 2023-01-27 DIAGNOSIS — I739 Peripheral vascular disease, unspecified: Secondary | ICD-10-CM

## 2023-01-27 MED ORDER — DOXYCYCLINE HYCLATE 100 MG PO TABS: 100.0000 mg | ORAL_TABLET | Freq: Two times a day (BID) | ORAL | 0 refills | Status: AC
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
## 2023-01-28 ENCOUNTER — Other Ambulatory Visit (HOSPITAL_COMMUNITY): Payer: Self-pay

## 2023-01-30 ENCOUNTER — Other Ambulatory Visit: Payer: Self-pay

## 2023-01-30 DIAGNOSIS — E1142 Type 2 diabetes mellitus with diabetic polyneuropathy: Secondary | ICD-10-CM

## 2023-02-01 MED ORDER — PREGABALIN 200 MG PO CAPS: ORAL_CAPSULE | ORAL | 3 refills | Status: DC

## 2023-02-04 ENCOUNTER — Other Ambulatory Visit: Payer: Self-pay

## 2023-02-04 DIAGNOSIS — G8929 Other chronic pain: Secondary | ICD-10-CM

## 2023-02-04 MED ORDER — OXYCODONE HCL 5 MG PO TABS: 5.0000 mg | ORAL_TABLET | Freq: Four times a day (QID) | ORAL | 0 refills | Status: DC | PRN

## 2023-02-05 ENCOUNTER — Encounter: Payer: Self-pay | Admitting: Dietician

## 2023-02-05 DIAGNOSIS — E1159 Type 2 diabetes mellitus with other circulatory complications: Secondary | ICD-10-CM | POA: Diagnosis not present

## 2023-02-06 MED ORDER — OXYCODONE HCL 5 MG PO CAPS: 5.0000 mg | ORAL_CAPSULE | Freq: Four times a day (QID) | ORAL | 0 refills | Status: DC | PRN

## 2023-02-06 NOTE — Telephone Encounter (Signed)
Patient called in requesting her home oxycodone 5 mg. Rx was sent in to the patient's mail order pharmacy on 2/14, however, I have called this pharmacy to discontinue the rx and have re-sent it to [facility] on [address], per patient request.

## 2023-02-09 ENCOUNTER — Other Ambulatory Visit (HOSPITAL_COMMUNITY): Payer: Self-pay | Admitting: Podiatry

## 2023-02-09 ENCOUNTER — Telehealth: Payer: Self-pay

## 2023-02-09 ENCOUNTER — Other Ambulatory Visit: Payer: Self-pay | Admitting: Internal Medicine

## 2023-02-09 DIAGNOSIS — I739 Peripheral vascular disease, unspecified: Secondary | ICD-10-CM

## 2023-02-09 DIAGNOSIS — G8929 Other chronic pain: Secondary | ICD-10-CM

## 2023-02-09 MED ORDER — OXYCODONE HCL 5 MG PO TABS: 5.0000 mg | ORAL_TABLET | ORAL | 0 refills | Status: DC | PRN

## 2023-02-09 NOTE — Telephone Encounter (Signed)
Called pt - stated she does not want Oxycodone caps; requesting change back to tablets. Also stated [facility] does not [facility] the [facility].

## 2023-02-09 NOTE — Telephone Encounter (Signed)
Pt states she do not want oxycodone as capsule, she would like this to be tablet. Please call pt back.

## 2023-02-09 NOTE — Telephone Encounter (Signed)
Pt was called / informed.

## 2023-02-09 NOTE — Telephone Encounter (Signed)
RTC to patient message left that Clinics had returned her call about her medication change to tablets and not capsules.

## 2023-02-09 NOTE — Telephone Encounter (Signed)
Pt wants Oxycodone tablet rx sent to [facility] on [address]. Thanks

## 2023-02-10 ENCOUNTER — Emergency Department (HOSPITAL_COMMUNITY): Payer: 59

## 2023-02-10 ENCOUNTER — Ambulatory Visit (INDEPENDENT_AMBULATORY_CARE_PROVIDER_SITE_OTHER): Payer: 59 | Admitting: Podiatry

## 2023-02-10 ENCOUNTER — Encounter (HOSPITAL_COMMUNITY): Payer: Self-pay

## 2023-02-10 ENCOUNTER — Encounter: Payer: Self-pay | Admitting: Podiatry

## 2023-02-10 ENCOUNTER — Inpatient Hospital Stay (HOSPITAL_COMMUNITY)
Admission: EM | Admit: 2023-02-10 | Discharge: 2023-02-16 | DRG: 240 | Disposition: A | Discharge status: Home Health | Payer: 59 | Source: Ambulatory Visit | Attending: Internal Medicine | Admitting: Internal Medicine

## 2023-02-10 ENCOUNTER — Other Ambulatory Visit: Payer: Self-pay

## 2023-02-10 ENCOUNTER — Ambulatory Visit (INDEPENDENT_AMBULATORY_CARE_PROVIDER_SITE_OTHER): Payer: 59

## 2023-02-10 ENCOUNTER — Inpatient Hospital Stay (HOSPITAL_COMMUNITY): Payer: 59

## 2023-02-10 DIAGNOSIS — Z7985 Long-term (current) use of injectable non-insulin antidiabetic drugs: Secondary | ICD-10-CM | POA: Diagnosis not present

## 2023-02-10 DIAGNOSIS — E1165 Type 2 diabetes mellitus with hyperglycemia: Secondary | ICD-10-CM | POA: Diagnosis not present

## 2023-02-10 DIAGNOSIS — M86671 Other chronic osteomyelitis, right ankle and foot: Secondary | ICD-10-CM | POA: Diagnosis not present

## 2023-02-10 DIAGNOSIS — N182 Chronic kidney disease, stage 2 (mild): Secondary | ICD-10-CM | POA: Diagnosis present

## 2023-02-10 DIAGNOSIS — F17210 Nicotine dependence, cigarettes, uncomplicated: Secondary | ICD-10-CM | POA: Diagnosis present

## 2023-02-10 DIAGNOSIS — E11622 Type 2 diabetes mellitus with other skin ulcer: Secondary | ICD-10-CM | POA: Diagnosis present

## 2023-02-10 DIAGNOSIS — L97519 Non-pressure chronic ulcer of other part of right foot with unspecified severity: Secondary | ICD-10-CM | POA: Diagnosis not present

## 2023-02-10 DIAGNOSIS — I152 Hypertension secondary to endocrine disorders: Secondary | ICD-10-CM | POA: Diagnosis present

## 2023-02-10 DIAGNOSIS — L03039 Cellulitis of unspecified toe: Secondary | ICD-10-CM

## 2023-02-10 DIAGNOSIS — E876 Hypokalemia: Secondary | ICD-10-CM | POA: Diagnosis present

## 2023-02-10 DIAGNOSIS — E1139 Type 2 diabetes mellitus with other diabetic ophthalmic complication: Secondary | ICD-10-CM | POA: Diagnosis present

## 2023-02-10 DIAGNOSIS — E118 Type 2 diabetes mellitus with unspecified complications: Secondary | ICD-10-CM | POA: Diagnosis present

## 2023-02-10 DIAGNOSIS — M86171 Other acute osteomyelitis, right ankle and foot: Secondary | ICD-10-CM | POA: Diagnosis not present

## 2023-02-10 DIAGNOSIS — I872 Venous insufficiency (chronic) (peripheral): Secondary | ICD-10-CM | POA: Diagnosis present

## 2023-02-10 DIAGNOSIS — Z5986 Financial insecurity: Secondary | ICD-10-CM

## 2023-02-10 DIAGNOSIS — Z7982 Long term (current) use of aspirin: Secondary | ICD-10-CM

## 2023-02-10 DIAGNOSIS — E1152 Type 2 diabetes mellitus with diabetic peripheral angiopathy with gangrene: Secondary | ICD-10-CM | POA: Diagnosis not present

## 2023-02-10 DIAGNOSIS — L03031 Cellulitis of right toe: Secondary | ICD-10-CM | POA: Diagnosis not present

## 2023-02-10 DIAGNOSIS — B957 Other staphylococcus as the cause of diseases classified elsewhere: Secondary | ICD-10-CM | POA: Diagnosis not present

## 2023-02-10 DIAGNOSIS — E1122 Type 2 diabetes mellitus with diabetic chronic kidney disease: Secondary | ICD-10-CM | POA: Diagnosis not present

## 2023-02-10 DIAGNOSIS — E08621 Diabetes mellitus due to underlying condition with foot ulcer: Secondary | ICD-10-CM | POA: Diagnosis not present

## 2023-02-10 DIAGNOSIS — M869 Osteomyelitis, unspecified: Secondary | ICD-10-CM | POA: Diagnosis not present

## 2023-02-10 DIAGNOSIS — Z79899 Other long term (current) drug therapy: Secondary | ICD-10-CM

## 2023-02-10 DIAGNOSIS — L97514 Non-pressure chronic ulcer of other part of right foot with necrosis of bone: Secondary | ICD-10-CM | POA: Diagnosis present

## 2023-02-10 DIAGNOSIS — L299 Pruritus, unspecified: Secondary | ICD-10-CM | POA: Diagnosis present

## 2023-02-10 DIAGNOSIS — Z720 Tobacco use: Secondary | ICD-10-CM | POA: Diagnosis present

## 2023-02-10 DIAGNOSIS — E11628 Type 2 diabetes mellitus with other skin complications: Secondary | ICD-10-CM | POA: Diagnosis present

## 2023-02-10 DIAGNOSIS — L089 Local infection of the skin and subcutaneous tissue, unspecified: Principal | ICD-10-CM

## 2023-02-10 DIAGNOSIS — L97509 Non-pressure chronic ulcer of other part of unspecified foot with unspecified severity: Secondary | ICD-10-CM | POA: Diagnosis not present

## 2023-02-10 DIAGNOSIS — L97512 Non-pressure chronic ulcer of other part of right foot with fat layer exposed: Secondary | ICD-10-CM | POA: Diagnosis not present

## 2023-02-10 DIAGNOSIS — L97319 Non-pressure chronic ulcer of right ankle with unspecified severity: Secondary | ICD-10-CM

## 2023-02-10 DIAGNOSIS — M797 Fibromyalgia: Secondary | ICD-10-CM | POA: Diagnosis present

## 2023-02-10 DIAGNOSIS — T364X6A Underdosing of tetracyclines, initial encounter: Secondary | ICD-10-CM | POA: Diagnosis present

## 2023-02-10 DIAGNOSIS — Z90710 Acquired absence of both cervix and uterus: Secondary | ICD-10-CM

## 2023-02-10 DIAGNOSIS — K760 Fatty (change of) liver, not elsewhere classified: Secondary | ICD-10-CM | POA: Diagnosis not present

## 2023-02-10 DIAGNOSIS — Z888 Allergy status to other drugs, medicaments and biological substances status: Secondary | ICD-10-CM

## 2023-02-10 DIAGNOSIS — Z91041 Radiographic dye allergy status: Secondary | ICD-10-CM

## 2023-02-10 DIAGNOSIS — Z825 Family history of asthma and other chronic lower respiratory diseases: Secondary | ICD-10-CM

## 2023-02-10 DIAGNOSIS — E785 Hyperlipidemia, unspecified: Secondary | ICD-10-CM | POA: Diagnosis present

## 2023-02-10 DIAGNOSIS — E1142 Type 2 diabetes mellitus with diabetic polyneuropathy: Secondary | ICD-10-CM

## 2023-02-10 DIAGNOSIS — F32A Depression, unspecified: Secondary | ICD-10-CM | POA: Diagnosis present

## 2023-02-10 DIAGNOSIS — F324 Major depressive disorder, single episode, in partial remission: Secondary | ICD-10-CM | POA: Diagnosis present

## 2023-02-10 DIAGNOSIS — Z794 Long term (current) use of insulin: Secondary | ICD-10-CM

## 2023-02-10 DIAGNOSIS — Z6837 Body mass index (BMI) 37.0-37.9, adult: Secondary | ICD-10-CM

## 2023-02-10 DIAGNOSIS — Z89612 Acquired absence of left leg above knee: Secondary | ICD-10-CM | POA: Diagnosis not present

## 2023-02-10 DIAGNOSIS — Z95820 Peripheral vascular angioplasty status with implants and grafts: Secondary | ICD-10-CM

## 2023-02-10 DIAGNOSIS — Z8719 Personal history of other diseases of the digestive system: Secondary | ICD-10-CM

## 2023-02-10 DIAGNOSIS — R32 Unspecified urinary incontinence: Secondary | ICD-10-CM | POA: Diagnosis present

## 2023-02-10 DIAGNOSIS — I5032 Chronic diastolic (congestive) heart failure: Secondary | ICD-10-CM | POA: Diagnosis not present

## 2023-02-10 DIAGNOSIS — Z8249 Family history of ischemic heart disease and other diseases of the circulatory system: Secondary | ICD-10-CM

## 2023-02-10 DIAGNOSIS — F1120 Opioid dependence, uncomplicated: Secondary | ICD-10-CM | POA: Diagnosis present

## 2023-02-10 DIAGNOSIS — L989 Disorder of the skin and subcutaneous tissue, unspecified: Secondary | ICD-10-CM

## 2023-02-10 DIAGNOSIS — R112 Nausea with vomiting, unspecified: Secondary | ICD-10-CM | POA: Diagnosis present

## 2023-02-10 DIAGNOSIS — E11621 Type 2 diabetes mellitus with foot ulcer: Secondary | ICD-10-CM | POA: Diagnosis present

## 2023-02-10 DIAGNOSIS — Z86718 Personal history of other venous thrombosis and embolism: Secondary | ICD-10-CM

## 2023-02-10 DIAGNOSIS — Z8616 Personal history of COVID-19: Secondary | ICD-10-CM | POA: Diagnosis not present

## 2023-02-10 DIAGNOSIS — M5416 Radiculopathy, lumbar region: Secondary | ICD-10-CM | POA: Diagnosis present

## 2023-02-10 DIAGNOSIS — M00071 Staphylococcal arthritis, right ankle and foot: Secondary | ICD-10-CM | POA: Diagnosis not present

## 2023-02-10 DIAGNOSIS — N3946 Mixed incontinence: Secondary | ICD-10-CM | POA: Diagnosis present

## 2023-02-10 DIAGNOSIS — M25511 Pain in right shoulder: Secondary | ICD-10-CM | POA: Diagnosis present

## 2023-02-10 DIAGNOSIS — E1169 Type 2 diabetes mellitus with other specified complication: Secondary | ICD-10-CM | POA: Diagnosis present

## 2023-02-10 DIAGNOSIS — L0390 Cellulitis, unspecified: Secondary | ICD-10-CM | POA: Diagnosis not present

## 2023-02-10 DIAGNOSIS — Z833 Family history of diabetes mellitus: Secondary | ICD-10-CM

## 2023-02-10 DIAGNOSIS — Z91138 Patient's unintentional underdosing of medication regimen for other reason: Secondary | ICD-10-CM

## 2023-02-10 DIAGNOSIS — G894 Chronic pain syndrome: Secondary | ICD-10-CM | POA: Diagnosis present

## 2023-02-10 DIAGNOSIS — Z885 Allergy status to narcotic agent status: Secondary | ICD-10-CM

## 2023-02-10 DIAGNOSIS — F332 Major depressive disorder, recurrent severe without psychotic features: Secondary | ICD-10-CM | POA: Diagnosis present

## 2023-02-10 DIAGNOSIS — Z7984 Long term (current) use of oral hypoglycemic drugs: Secondary | ICD-10-CM | POA: Diagnosis not present

## 2023-02-10 DIAGNOSIS — D179 Benign lipomatous neoplasm, unspecified: Secondary | ICD-10-CM | POA: Diagnosis not present

## 2023-02-10 DIAGNOSIS — Z89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): Secondary | ICD-10-CM

## 2023-02-10 DIAGNOSIS — I70202 Unspecified atherosclerosis of native arteries of extremities, left leg: Secondary | ICD-10-CM | POA: Diagnosis present

## 2023-02-10 DIAGNOSIS — E119 Type 2 diabetes mellitus without complications: Secondary | ICD-10-CM

## 2023-02-10 DIAGNOSIS — K219 Gastro-esophageal reflux disease without esophagitis: Secondary | ICD-10-CM | POA: Diagnosis present

## 2023-02-10 DIAGNOSIS — E669 Obesity, unspecified: Secondary | ICD-10-CM | POA: Diagnosis present

## 2023-02-10 DIAGNOSIS — H409 Unspecified glaucoma: Secondary | ICD-10-CM | POA: Diagnosis present

## 2023-02-10 DIAGNOSIS — I70261 Atherosclerosis of native arteries of extremities with gangrene, right leg: Secondary | ICD-10-CM | POA: Diagnosis not present

## 2023-02-10 DIAGNOSIS — T300 Burn of unspecified body region, unspecified degree: Secondary | ICD-10-CM | POA: Diagnosis present

## 2023-02-10 DIAGNOSIS — Z9889 Other specified postprocedural states: Secondary | ICD-10-CM | POA: Diagnosis present

## 2023-02-10 DIAGNOSIS — G8929 Other chronic pain: Secondary | ICD-10-CM

## 2023-02-10 DIAGNOSIS — E1159 Type 2 diabetes mellitus with other circulatory complications: Secondary | ICD-10-CM | POA: Diagnosis present

## 2023-02-10 DIAGNOSIS — I10 Essential (primary) hypertension: Secondary | ICD-10-CM | POA: Diagnosis present

## 2023-02-10 LAB — CBC WITH DIFFERENTIAL/PLATELET
Abs Immature Granulocytes: 0.03 10*3/uL (ref 0.00–0.07)
Basophils Absolute: 0.1 10*3/uL (ref 0.0–0.1)
Basophils Relative: 1 %
Eosinophils Absolute: 0.1 10*3/uL (ref 0.0–0.5)
Eosinophils Relative: 1 %
HCT: 38.5 % (ref 36.0–46.0)
Hemoglobin: 13 g/dL (ref 12.0–15.0)
Immature Granulocytes: 0 %
Lymphocytes Relative: 38 %
Lymphs Abs: 3.2 10*3/uL (ref 0.7–4.0)
MCH: 31.6 pg (ref 26.0–34.0)
MCHC: 33.8 g/dL (ref 30.0–36.0)
MCV: 93.7 fL (ref 80.0–100.0)
Monocytes Absolute: 0.4 10*3/uL (ref 0.1–1.0)
Monocytes Relative: 5 %
Neutro Abs: 4.6 10*3/uL (ref 1.7–7.7)
Neutrophils Relative %: 55 %
Platelets: 203 10*3/uL (ref 150–400)
RBC: 4.11 MIL/uL (ref 3.87–5.11)
RDW: 14.6 % (ref 11.5–15.5)
WBC: 8.4 10*3/uL (ref 4.0–10.5)
nRBC: 0 % (ref 0.0–0.2)

## 2023-02-10 LAB — COMPREHENSIVE METABOLIC PANEL
ALT: 10 U/L (ref 0–44)
AST: 19 U/L (ref 15–41)
Albumin: 2.9 g/dL — ABNORMAL LOW (ref 3.5–5.0)
Alkaline Phosphatase: 76 U/L (ref 38–126)
Anion gap: 14 (ref 5–15)
BUN: 15 mg/dL (ref 8–23)
CO2: 27 mmol/L (ref 22–32)
Calcium: 8.7 mg/dL — ABNORMAL LOW (ref 8.9–10.3)
Chloride: 98 mmol/L (ref 98–111)
Creatinine, Ser: 0.97 mg/dL (ref 0.44–1.00)
GFR, Estimated: >60 mL/min (ref >60)
Glucose, Bld: 93 mg/dL (ref 70–99)
Potassium: 2.9 mmol/L — ABNORMAL LOW (ref 3.5–5.1)
Sodium: 139 mmol/L (ref 135–145)
Total Bilirubin: 0.3 mg/dL (ref 0.3–1.2)
Total Protein: 6.7 g/dL (ref 6.5–8.1)

## 2023-02-10 LAB — PROTIME-INR
INR: 1.1 (ref 0.8–1.2)
Prothrombin Time: 14.5 seconds (ref 11.4–15.2)

## 2023-02-10 LAB — CBG MONITORING, ED: Glucose-Capillary: 97 mg/dL (ref 70–99)

## 2023-02-10 LAB — LACTIC ACID, PLASMA
Lactic Acid, Venous: 2.9 mmol/L (ref 0.5–1.9)
Lactic Acid, Venous: 2.1 mmol/L (ref 0.5–1.9)

## 2023-02-10 LAB — C-REACTIVE PROTEIN: CRP: 3.7 mg/dL — ABNORMAL HIGH (ref <1.0)

## 2023-02-10 LAB — APTT: aPTT: 31 seconds (ref 24–36)

## 2023-02-10 LAB — SEDIMENTATION RATE: Sed Rate: 65 mm/hr — ABNORMAL HIGH (ref 0–22)

## 2023-02-10 LAB — GLUCOSE, CAPILLARY
Glucose-Capillary: 109 mg/dL — ABNORMAL HIGH (ref 70–99)
Glucose-Capillary: 129 mg/dL — ABNORMAL HIGH (ref 70–99)

## 2023-02-10 LAB — HIV ANTIBODY (ROUTINE TESTING W REFLEX): HIV Screen 4th Generation wRfx: NONREACTIVE

## 2023-02-10 MED ORDER — POTASSIUM CHLORIDE CRYS ER 20 MEQ PO TBCR
40.0000 meq | EXTENDED_RELEASE_TABLET | Freq: Two times a day (BID) | ORAL | Status: AC
  Administered 2023-02-10 – 2023-02-11 (×2): 40 meq via ORAL
  Filled 2023-02-10 (×2): qty 2

## 2023-02-10 MED ORDER — TRAZODONE HCL 50 MG PO TABS: 200.0000 mg | ORAL_TABLET | Freq: Every evening | ORAL | Status: DC | PRN

## 2023-02-10 MED ORDER — GADOBUTROL 1 MMOL/ML IV SOLN
10.0000 mL | Freq: Once | INTRAVENOUS | Status: AC | PRN
  Administered 2023-02-10: 10 mL via INTRAVENOUS

## 2023-02-10 MED ORDER — INSULIN ASPART 100 UNIT/ML IJ SOLN
0.0000 [IU] | Freq: Every day | INTRAMUSCULAR | Status: DC
  Administered 2023-02-15: 2 [IU] via SUBCUTANEOUS

## 2023-02-10 MED ORDER — ACETAMINOPHEN 500 MG PO TABS
1000.0000 mg | ORAL_TABLET | Freq: Three times a day (TID) | ORAL | Status: DC
  Administered 2023-02-10 – 2023-02-16 (×15): 1000 mg via ORAL
  Filled 2023-02-10 (×15): qty 2

## 2023-02-10 MED ORDER — DULOXETINE HCL 30 MG PO CPEP
30.0000 mg | ORAL_CAPSULE | Freq: Two times a day (BID) | ORAL | Status: DC
  Administered 2023-02-10 – 2023-02-16 (×12): 30 mg via ORAL
  Filled 2023-02-10 (×12): qty 1

## 2023-02-10 MED ORDER — HYDROMORPHONE HCL 1 MG/ML IJ SOLN
1.0000 mg | INTRAMUSCULAR | Status: DC | PRN
  Administered 2023-02-11 (×2): 1 mg via INTRAVENOUS
  Filled 2023-02-10 (×3): qty 1

## 2023-02-10 MED ORDER — POTASSIUM CHLORIDE CRYS ER 20 MEQ PO TBCR
40.0000 meq | EXTENDED_RELEASE_TABLET | ORAL | Status: AC
  Administered 2023-02-10: 40 meq via ORAL
  Filled 2023-02-10: qty 2

## 2023-02-10 MED ORDER — ENOXAPARIN SODIUM 60 MG/0.6ML IJ SOSY: 50.0000 mg | PREFILLED_SYRINGE | INTRAMUSCULAR | Status: DC

## 2023-02-10 MED ORDER — OXYBUTYNIN CHLORIDE ER 10 MG PO TB24
10.0000 mg | ORAL_TABLET | Freq: Every day | ORAL | Status: DC
  Administered 2023-02-11 – 2023-02-16 (×5): 10 mg via ORAL
  Filled 2023-02-10 (×7): qty 1

## 2023-02-10 MED ORDER — ACETAMINOPHEN 325 MG PO TABS: 650.0000 mg | ORAL_TABLET | Freq: Four times a day (QID) | ORAL | Status: DC | PRN

## 2023-02-10 MED ORDER — ROSUVASTATIN CALCIUM 20 MG PO TABS
20.0000 mg | ORAL_TABLET | Freq: Every morning | ORAL | Status: DC
  Administered 2023-02-11 – 2023-02-16 (×6): 20 mg via ORAL
  Filled 2023-02-10 (×6): qty 1

## 2023-02-10 MED ORDER — OXYCODONE HCL 5 MG PO TABS
5.0000 mg | ORAL_TABLET | ORAL | Status: DC | PRN
  Administered 2023-02-10 – 2023-02-16 (×14): 5 mg via ORAL
  Filled 2023-02-10 (×14): qty 1

## 2023-02-10 MED ORDER — LACTATED RINGERS IV BOLUS
1000.0000 mL | Freq: Once | INTRAVENOUS | Status: AC
  Administered 2023-02-10: 1000 mL via INTRAVENOUS

## 2023-02-10 MED ORDER — ASPIRIN 81 MG PO TBEC: 81.0000 mg | DELAYED_RELEASE_TABLET | Freq: Every day | ORAL | Status: DC

## 2023-02-10 MED ORDER — MIRABEGRON ER 50 MG PO TB24
50.0000 mg | ORAL_TABLET | Freq: Every day | ORAL | Status: DC
  Administered 2023-02-11 – 2023-02-16 (×5): 50 mg via ORAL
  Filled 2023-02-10 (×7): qty 1

## 2023-02-10 MED ORDER — GERHARDT'S BUTT CREAM
TOPICAL_CREAM | Freq: Every day | CUTANEOUS | Status: DC
  Filled 2023-02-10: qty 1

## 2023-02-10 MED ORDER — VANCOMYCIN HCL 2000 MG/400ML IV SOLN
2000.0000 mg | Freq: Once | INTRAVENOUS | Status: AC
  Administered 2023-02-10: 2000 mg via INTRAVENOUS
  Filled 2023-02-10: qty 400

## 2023-02-10 MED ORDER — ALBUTEROL SULFATE (2.5 MG/3ML) 0.083% IN NEBU: 2.5000 mg | INHALATION_SOLUTION | Freq: Four times a day (QID) | RESPIRATORY_TRACT | Status: DC | PRN

## 2023-02-10 MED ORDER — INSULIN ASPART 100 UNIT/ML IJ SOLN
0.0000 [IU] | Freq: Three times a day (TID) | INTRAMUSCULAR | Status: DC
  Administered 2023-02-14 (×2): 2 [IU] via SUBCUTANEOUS
  Administered 2023-02-15: 3 [IU] via SUBCUTANEOUS
  Administered 2023-02-15 (×2): 5 [IU] via SUBCUTANEOUS
  Administered 2023-02-16: 3 [IU] via SUBCUTANEOUS
  Administered 2023-02-16: 5 [IU] via SUBCUTANEOUS

## 2023-02-10 MED ORDER — PANTOPRAZOLE SODIUM 40 MG PO TBEC
40.0000 mg | DELAYED_RELEASE_TABLET | Freq: Every day | ORAL | Status: DC
  Administered 2023-02-10 – 2023-02-16 (×6): 40 mg via ORAL
  Filled 2023-02-10 (×6): qty 1

## 2023-02-10 MED ORDER — SODIUM CHLORIDE 0.9 % IV SOLN
2.0000 g | Freq: Three times a day (TID) | INTRAVENOUS | Status: DC
  Administered 2023-02-10 – 2023-02-14 (×11): 2 g via INTRAVENOUS
  Filled 2023-02-10 (×13): qty 12.5

## 2023-02-10 MED ORDER — POLYETHYLENE GLYCOL 3350 17 G PO PACK
17.0000 g | PACK | Freq: Every day | ORAL | Status: DC | PRN
  Filled 2023-02-10: qty 1

## 2023-02-10 MED ORDER — PREGABALIN 100 MG PO CAPS
100.0000 mg | ORAL_CAPSULE | Freq: Three times a day (TID) | ORAL | Status: DC
  Administered 2023-02-10 – 2023-02-16 (×16): 100 mg via ORAL
  Filled 2023-02-10 (×17): qty 1

## 2023-02-10 MED ORDER — VANCOMYCIN HCL 1250 MG/250ML IV SOLN
1250.0000 mg | INTRAVENOUS | Status: DC
  Administered 2023-02-11 – 2023-02-13 (×3): 1250 mg via INTRAVENOUS
  Filled 2023-02-10 (×3): qty 250

## 2023-02-10 NOTE — ED Notes (Signed)
Admitting at bedside 
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
## 2023-02-10 NOTE — Progress Notes (Signed)
Pharmacy Antibiotic Note  Jennifer [name] is a 62 y.o. female admitted on 02/10/2023 with osteomyelitis of great toe and septic arthritis of IP joint.  Pharmacy has been consulted for Vancomycin and Cefepime dosing. Has been on po doxy PTA. Will likely need a transmetatarsal amputation.  Plan: Cefepime 2gm IV q8h Vancomycin 2000 mg IV now then 1279m IV Q 24 hrs. Goal AUC 400-550. Expected AUC: 498 , SCr used: 0.97, Vd coeff 0.5 Will f/u renal function, micro data, and pt's clinical condition Vanc levels prn   Height: 5' 5"$  (165.1 cm) Weight: 103 kg (227 lb) IBW/kg (Calculated) : 57  Temp (24hrs), Avg:98.2 F (36.8 C), Min:98.2 F (36.8 C), Max:98.2 F (36.8 C)  Recent Labs  Lab 02/10/23 1220 02/10/23 1409  WBC 8.4  --   CREATININE 0.97  --   LATICACIDVEN 2.9* 2.1*    Estimated Creatinine Clearance: 70.7 mL/min (by C-G formula based on SCr of 0.97 mg/dL).    Allergies  Allergen Reactions   Benazepril Swelling    Possible tongue swelling 06/14/2021   Chantix [Varenicline]     Unknown reaction   Ioversol    Morphine Sulfate    Pineapple Swelling    Fresh pineapple - lip swelling   Abilify [Aripiprazole] Other (See Comments)    Urinary freq Nov 2016   Iohexol Other (See Comments)    IV CONTRAST CAUSED TRANSIENT NEPHROPATHY (KIDNEY INSUFFICIENCY) IN 2007    Ivp Dye [Iodinated Contrast Media] Other (See Comments)    IV CONTRAST CAUSED TRANSIENT NEPHROPATHY (KIDNEY INSUFFICIENCY) IN 2007    Antimicrobials this admission: 2/20 Vanc >>  2/20 Cefepime >>   Microbiology results: 2/20 BCx:   Thank you for allowing pharmacy to be a part of this patient's care.  CSherlon Handing PharmD, BCPS Please see amion for complete clinical pharmacist phone list 02/10/2023 4:34 PM
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
## 2023-02-10 NOTE — ED Provider Notes (Signed)
[facility] Provider Note   CSN: [id number] Arrival date & time: 02/10/23  1156     History {Add pertinent medical, surgical, social history, OB history to HPI:1} Chief Complaint  Patient presents with   Diabetic Ulcer    Jennifer [name] is a 62 y.o. female.  HPI     Home Medications Prior to Admission medications   Medication Sig Start Date End Date Taking? Authorizing Provider  acetaminophen (TYLENOL) 650 MG CR tablet Take 2 tablets (1,300 mg total) by mouth every 8 (eight) hours as needed for pain. 10/21/22   Angelica Pou, MD  albuterol [facility] HFA) [phone] Base) MCG/ACT inhaler INHALE 2 PUFFS BY MOUTH EVERY 6 HOURS AS NEEDED FOR WHEEZING 10/27/22   Angelica Pou, MD  amLODipine (NORVASC) 10 MG tablet Take 1 tablet (10 mg total) by mouth daily. 10/23/22   Angelica Pou, MD  aspirin EC 81 MG tablet Take 1 tablet (81 mg total) by mouth daily. 09/19/21   Angelica Pou, MD  baclofen (LIORESAL) 10 MG tablet Take 0.5 tablets (5 mg total) by mouth 2 (two) times daily. 12/05/22   Angelica Pou, MD  Continuous Blood Gluc Sensor (FREESTYLE LIBRE 3 SENSOR) MISC 1 each by Does not apply route 6 (six) times daily. Place 1 sensor on the skin every 14 days. Use to check glucose continuously 03/06/22   Delene Ruffini, MD  DULoxetine (CYMBALTA) 30 MG capsule Take 1 capsule (30 mg total) by mouth 2 (two) times daily. 10/23/22   Angelica Pou, MD  hydrochlorothiazide (HYDRODIURIL) 25 MG tablet Take 1 tablet (25 mg total) by mouth daily. 10/23/22 10/18/23  Angelica Pou, MD  insulin degludec (TRESIBA FLEXTOUCH) 200 UNIT/ML FlexTouch Pen INJECT 67 UNITS UNDER THE SKIN DAILY 01/26/23   Angelica Pou, MD  Insulin Pen Needle 32G X 4 MM MISC Use to inject insulin 4 times a day and semaglutide once weekly 11/04/22   Angelica Pou, MD  Lancets MISC Use as directed 10/27/22   Angelica Pou, MD  metFORMIN (GLUCOPHAGE) 500  MG tablet Take 1 tablet (500 mg total) by mouth 2 (two) times daily. 10/23/22   Angelica Pou, MD  metoprolol succinate (TOPROL-XL) 100 MG 24 hr tablet Take 1 tablet (100 mg total) by mouth daily. TAKE WITH OR IMMEDIATELY FOLLOWING A MEAL. 10/23/22   Angelica Pou, MD  Multiple Vitamin (MULTIVITAMIN) tablet Take 1 tablet by mouth daily.    [provider]  MYRBETRIQ 50 MG TB24 tablet Take 1 tablet (50 mg total) by mouth daily. 01/12/23   Angelica Pou, MD  nystatin powder Apply 1 application  topically 3 (three) times daily as needed. 06/04/22   Angelica Pou, MD  OVER THE COUNTER MEDICATION Take 1 tablet by mouth daily. [employer] otc supplement    [provider]  oxybutynin (DITROPAN-XL) 10 MG 24 hr tablet Take 10 mg by mouth daily. 10/03/22   [provider]  oxyCODONE (OXY IR/ROXICODONE) 5 MG immediate release tablet Take 1 tablet (5 mg total) by mouth every 4 (four) hours as needed for severe pain. 02/09/23   Angelica Pou, MD  pantoprazole (PROTONIX) 40 MG tablet Take 1 tablet (40 mg total) by mouth daily. 10/23/22   Angelica Pou, MD  povidone-iodine (BETADINE) 10 % external solution Apply 1 Application topically as needed for wound care. 01/15/23   Lorenda Peck, DPM  pregabalin (LYRICA) 200 MG capsule TAKE  1 CAPSULE BY MOUTH THREE TIMES A DAY 02/01/23   Angelica Pou, MD  rosuvastatin (CRESTOR) 20 MG tablet Take 1 tablet (20 mg total) by mouth in the morning. 10/23/22   Angelica Pou, MD  Semaglutide, 1 MG/DOSE, (OZEMPIC, 1 MG/DOSE,) 4 MG/3ML SOPN Inject 1 mg into the skin once a week. 12/25/22   Angelica Pou, MD  traZODone (DESYREL) 100 MG tablet Take 2 tablets (200 mg total) by mouth at bedtime as needed for sleep. 10/23/22   Angelica Pou, MD  Potassium Chloride ER 20 MEQ TBCR Take 40 mEq by mouth daily. 01/02/14 01/23/14  Othella Boyer, MD      Allergies    Benazepril, Chantix [varenicline],  Ioversol, Morphine sulfate, Pineapple, Abilify [aripiprazole], Iohexol, and Ivp dye [iodinated contrast media]    Review of Systems   Review of Systems  Physical Exam Updated Vital Signs BP (!) 130/116   Pulse 72   Temp 98.2 F (36.8 C)   Resp 16   Ht 5' 5"$  (1.651 m)   Wt 103 kg   SpO2 96%   BMI 37.77 kg/m  Physical Exam  ED Results / Procedures / Treatments   Labs (all labs ordered are listed, but only abnormal results are displayed) Labs Reviewed  LACTIC ACID, PLASMA - Abnormal; Notable for the following components:      Result Value   Lactic Acid, Venous 2.9 (*)    All other components within normal limits  LACTIC ACID, PLASMA - Abnormal; Notable for the following components:   Lactic Acid, Venous 2.1 (*)    All other components within normal limits  COMPREHENSIVE METABOLIC PANEL - Abnormal; Notable for the following components:   Potassium 2.9 (*)    Calcium 8.7 (*)    Albumin 2.9 (*)    All other components within normal limits  SEDIMENTATION RATE - Abnormal; Notable for the following components:   Sed Rate 65 (*)    All other components within normal limits  C-REACTIVE PROTEIN - Abnormal; Notable for the following components:   CRP 3.7 (*)    All other components within normal limits  CULTURE, BLOOD (ROUTINE X 2)  CULTURE, BLOOD (ROUTINE X 2)  CBC WITH DIFFERENTIAL/PLATELET  HIV ANTIBODY (ROUTINE TESTING W REFLEX)  PROTIME-INR  APTT  CBG MONITORING, ED    EKG None  Radiology MR FOOT RIGHT W WO CONTRAST  Result Date: 02/10/2023 CLINICAL DATA:  cf osteo of R foot, diabetic foot ulcer EXAM: MRI OF THE RIGHT FOREFOOT WITHOUT AND WITH CONTRAST TECHNIQUE: Multiplanar, multisequence MR imaging of the right forefoot was performed before and after the administration of intravenous contrast. CONTRAST:  47m GADAVIST GADOBUTROL 1 MMOL/ML IV SOLN COMPARISON:  Right foot radiograph 02/10/2023 FINDINGS: Bones/Joint/Cartilage There is intense marrow edema and  enhancement of the great toe distal and proximal phalanx with interphalangeal joint destruction, probable pathologic fracture at the distal aspect of the proximal phalanx. 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 Ligaments Intact Lisfranc ligament. Muscles and Tendons Chronic denervation changes. Soft tissues There is a great toe soft tissue ulcer with intense soft tissue swelling and enhancement. There is no well-defined/drainable fluid collection. Additional generalized soft tissue edema throughout the foot. There is a soft tissue lipoma in the medial midfoot measuring 2.8 x 3.3 x 4.6 cm (axial T1 image 8, coronal T1 image 8). IMPRESSION: Great toe soft tissue ulcer with underlying osteomyelitis of the great toe proximal and distal phalanx, and septic  arthritis of the IP joint with joint destruction. Adjacent cellulitis without evidence of drainable soft tissue abscess. Electronically Signed   By: Maurine Simmering M.D.   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 Soft tissue ulceration along the medial great toe with underlying cortical erosions of the great toe distal phalanx and medial aspect of the proximal phalanx. Plantar calcaneal spur. IMPRESSION: Soft tissue ulceration along the medial great toe with underlying cortical erosions of the great toe distal phalanx and medial aspect of the proximal phalanx, suspicious for osteomyelitis. Electronically Signed   By: Darrin Nipper M.D.   On: 02/10/2023 13:13    Procedures Procedures  {Document cardiac monitor, telemetry assessment procedure when appropriate:1}  Medications Ordered in ED Medications  enoxaparin (LOVENOX) injection 50 mg (has no administration in  time range)  insulin aspart (novoLOG) injection 0-15 Units (has no administration in time range)  insulin aspart (novoLOG) injection 0-5 Units (has no administration in time range)  polyethylene glycol (MIRALAX / GLYCOLAX) packet 17 g (has no administration in time range)  potassium chloride SA (KLOR-CON M) CR tablet 40 mEq (has no administration in time range)  albuterol (PROVENTIL) (2.5 MG/3ML) 0.083% nebulizer solution 2.5 mg (has no administration in time range)  aspirin EC tablet 81 mg (has no administration in time range)  DULoxetine (CYMBALTA) DR capsule 30 mg (has no administration in time range)  mirabegron ER (MYRBETRIQ) tablet 50 mg (has no administration in time range)  oxybutynin (DITROPAN-XL) 24 hr tablet 10 mg (has no administration in time range)  oxyCODONE (Oxy IR/ROXICODONE) immediate release tablet 5 mg (has no administration in time range)  pantoprazole (PROTONIX) EC tablet 40 mg (has no administration in time range)  pregabalin (LYRICA) capsule 100 mg (has no administration in time range)  rosuvastatin (CRESTOR) tablet 20 mg (has no administration in time range)  traZODone (DESYREL) tablet 200 mg (has no administration in time range)  lactated ringers bolus 1,000 mL (has no administration in time range)  potassium chloride SA (KLOR-CON M) CR tablet 40 mEq (40 mEq Oral Given 02/10/23 1449)  gadobutrol (GADAVIST) 1 MMOL/ML injection 10 mL (10 mLs Intravenous Contrast Given 02/10/23 1555)    ED Course/ Medical Decision Making/ A&P Clinical Course as of 02/10/23 1640  Tue Feb 10, 2023  1429 Discussed with teaching service resident Dr [name].  They will admit for further management [RP]    Clinical Course User Index [RP] Fransico Meadow, MD   {   Click here for ABCD2, HEART and other calculatorsREFRESH Note before signing :1}                          Medical Decision Making Amount and/or Complexity of Data Reviewed Labs: ordered. Radiology:  ordered.  Risk Prescription drug management. Decision regarding hospitalization.   ***  {Document critical care time when appropriate:1} {Document review of labs and clinical decision tools ie heart score, Chads2Vasc2 etc:1}  {Document your independent review of radiology images, and any outside records:1} {Document your discussion with family members, caretakers, and with consultants:1} {Document social determinants of health affecting pt's care:1} {Document your decision making why or why not admission, treatments were needed:1} Final Clinical Impression(s) / ED Diagnoses Final diagnoses:  Diabetic foot infection ([place])  Osteomyelitis of right foot, unspecified type ([place])    Rx / DC Orders ED Discharge Orders     None
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
## 2023-02-10 NOTE — ED Notes (Signed)
Patient still in MRI.  

## 2023-02-10 NOTE — ED Notes (Signed)
Transported to MRI

## 2023-02-10 NOTE — Progress Notes (Signed)
Patient has scheduled cymbalta twice a day. There was a dose given at 1749. 2200 dose due on my shift and I reached out to pharmacy to see if I needed to hold medication. Pharmacy instructed me to give 2200 dose.   Patient also had a LR bolus due at 1630. IV antibiotics were given at the time as well as when I began my shift. Resident (IM) on call notified and he stated it was okay to give bolus after completion of IV antibiotics. Bolus is now infusing.

## 2023-02-11 ENCOUNTER — Observation Stay (HOSPITAL_COMMUNITY): Payer: 59

## 2023-02-11 ENCOUNTER — Other Ambulatory Visit: Payer: Self-pay | Admitting: Dietician

## 2023-02-11 DIAGNOSIS — E1152 Type 2 diabetes mellitus with diabetic peripheral angiopathy with gangrene: Secondary | ICD-10-CM | POA: Diagnosis not present

## 2023-02-11 DIAGNOSIS — E1169 Type 2 diabetes mellitus with other specified complication: Secondary | ICD-10-CM | POA: Diagnosis not present

## 2023-02-11 DIAGNOSIS — E1139 Type 2 diabetes mellitus with other diabetic ophthalmic complication: Secondary | ICD-10-CM | POA: Diagnosis present

## 2023-02-11 DIAGNOSIS — K760 Fatty (change of) liver, not elsewhere classified: Secondary | ICD-10-CM | POA: Diagnosis present

## 2023-02-11 DIAGNOSIS — E11628 Type 2 diabetes mellitus with other skin complications: Secondary | ICD-10-CM | POA: Diagnosis not present

## 2023-02-11 DIAGNOSIS — Z89431 Acquired absence of right foot: Secondary | ICD-10-CM | POA: Diagnosis not present

## 2023-02-11 DIAGNOSIS — L0390 Cellulitis, unspecified: Secondary | ICD-10-CM | POA: Diagnosis not present

## 2023-02-11 DIAGNOSIS — Z794 Long term (current) use of insulin: Secondary | ICD-10-CM

## 2023-02-11 DIAGNOSIS — L97514 Non-pressure chronic ulcer of other part of right foot with necrosis of bone: Secondary | ICD-10-CM | POA: Diagnosis not present

## 2023-02-11 DIAGNOSIS — L03031 Cellulitis of right toe: Secondary | ICD-10-CM | POA: Diagnosis not present

## 2023-02-11 DIAGNOSIS — I509 Heart failure, unspecified: Secondary | ICD-10-CM | POA: Diagnosis not present

## 2023-02-11 DIAGNOSIS — M00071 Staphylococcal arthritis, right ankle and foot: Secondary | ICD-10-CM | POA: Diagnosis not present

## 2023-02-11 DIAGNOSIS — M86171 Other acute osteomyelitis, right ankle and foot: Secondary | ICD-10-CM | POA: Diagnosis not present

## 2023-02-11 DIAGNOSIS — Z8616 Personal history of COVID-19: Secondary | ICD-10-CM | POA: Diagnosis not present

## 2023-02-11 DIAGNOSIS — J449 Chronic obstructive pulmonary disease, unspecified: Secondary | ICD-10-CM | POA: Diagnosis not present

## 2023-02-11 DIAGNOSIS — E669 Obesity, unspecified: Secondary | ICD-10-CM | POA: Diagnosis present

## 2023-02-11 DIAGNOSIS — I5032 Chronic diastolic (congestive) heart failure: Secondary | ICD-10-CM | POA: Diagnosis not present

## 2023-02-11 DIAGNOSIS — I152 Hypertension secondary to endocrine disorders: Secondary | ICD-10-CM | POA: Diagnosis present

## 2023-02-11 DIAGNOSIS — Z7985 Long-term (current) use of injectable non-insulin antidiabetic drugs: Secondary | ICD-10-CM | POA: Diagnosis not present

## 2023-02-11 DIAGNOSIS — F17200 Nicotine dependence, unspecified, uncomplicated: Secondary | ICD-10-CM | POA: Diagnosis not present

## 2023-02-11 DIAGNOSIS — Z7984 Long term (current) use of oral hypoglycemic drugs: Secondary | ICD-10-CM | POA: Diagnosis not present

## 2023-02-11 DIAGNOSIS — Z89612 Acquired absence of left leg above knee: Secondary | ICD-10-CM | POA: Diagnosis not present

## 2023-02-11 DIAGNOSIS — F32A Depression, unspecified: Secondary | ICD-10-CM | POA: Diagnosis present

## 2023-02-11 DIAGNOSIS — L089 Local infection of the skin and subcutaneous tissue, unspecified: Secondary | ICD-10-CM | POA: Diagnosis not present

## 2023-02-11 DIAGNOSIS — E11622 Type 2 diabetes mellitus with other skin ulcer: Secondary | ICD-10-CM | POA: Diagnosis present

## 2023-02-11 DIAGNOSIS — F1120 Opioid dependence, uncomplicated: Secondary | ICD-10-CM | POA: Diagnosis present

## 2023-02-11 DIAGNOSIS — E11621 Type 2 diabetes mellitus with foot ulcer: Secondary | ICD-10-CM | POA: Diagnosis present

## 2023-02-11 DIAGNOSIS — E1142 Type 2 diabetes mellitus with diabetic polyneuropathy: Secondary | ICD-10-CM | POA: Diagnosis not present

## 2023-02-11 DIAGNOSIS — M86671 Other chronic osteomyelitis, right ankle and foot: Secondary | ICD-10-CM | POA: Diagnosis present

## 2023-02-11 DIAGNOSIS — B957 Other staphylococcus as the cause of diseases classified elsewhere: Secondary | ICD-10-CM | POA: Diagnosis not present

## 2023-02-11 DIAGNOSIS — I70201 Unspecified atherosclerosis of native arteries of extremities, right leg: Secondary | ICD-10-CM | POA: Diagnosis not present

## 2023-02-11 DIAGNOSIS — F17210 Nicotine dependence, cigarettes, uncomplicated: Secondary | ICD-10-CM | POA: Diagnosis not present

## 2023-02-11 DIAGNOSIS — I70261 Atherosclerosis of native arteries of extremities with gangrene, right leg: Secondary | ICD-10-CM | POA: Diagnosis not present

## 2023-02-11 DIAGNOSIS — E1122 Type 2 diabetes mellitus with diabetic chronic kidney disease: Secondary | ICD-10-CM | POA: Diagnosis not present

## 2023-02-11 DIAGNOSIS — I110 Hypertensive heart disease with heart failure: Secondary | ICD-10-CM | POA: Diagnosis not present

## 2023-02-11 DIAGNOSIS — S98911A Complete traumatic amputation of right foot, level unspecified, initial encounter: Secondary | ICD-10-CM | POA: Diagnosis not present

## 2023-02-11 DIAGNOSIS — M869 Osteomyelitis, unspecified: Secondary | ICD-10-CM

## 2023-02-11 DIAGNOSIS — E1165 Type 2 diabetes mellitus with hyperglycemia: Secondary | ICD-10-CM | POA: Diagnosis not present

## 2023-02-11 LAB — GLUCOSE, CAPILLARY
Glucose-Capillary: 97 mg/dL (ref 70–99)
Glucose-Capillary: 100 mg/dL — ABNORMAL HIGH (ref 70–99)
Glucose-Capillary: 86 mg/dL (ref 70–99)
Glucose-Capillary: 106 mg/dL — ABNORMAL HIGH (ref 70–99)

## 2023-02-11 LAB — CBC
HCT: 35.4 % — ABNORMAL LOW (ref 36.0–46.0)
Hemoglobin: 12 g/dL (ref 12.0–15.0)
MCH: 31.3 pg (ref 26.0–34.0)
MCHC: 33.9 g/dL (ref 30.0–36.0)
MCV: 92.4 fL (ref 80.0–100.0)
Platelets: 173 10*3/uL (ref 150–400)
RBC: 3.83 MIL/uL — ABNORMAL LOW (ref 3.87–5.11)
RDW: 14.6 % (ref 11.5–15.5)
WBC: 7.4 10*3/uL (ref 4.0–10.5)
nRBC: 0 % (ref 0.0–0.2)

## 2023-02-11 LAB — LACTIC ACID, PLASMA: Lactic Acid, Venous: 1 mmol/L (ref 0.5–1.9)

## 2023-02-11 LAB — BASIC METABOLIC PANEL
Anion gap: 9 (ref 5–15)
BUN: 10 mg/dL (ref 8–23)
CO2: 27 mmol/L (ref 22–32)
Calcium: 8.3 mg/dL — ABNORMAL LOW (ref 8.9–10.3)
Chloride: 105 mmol/L (ref 98–111)
Creatinine, Ser: 0.77 mg/dL (ref 0.44–1.00)
GFR, Estimated: >60 mL/min (ref >60)
Glucose, Bld: 109 mg/dL — ABNORMAL HIGH (ref 70–99)
Potassium: 3.6 mmol/L (ref 3.5–5.1)
Sodium: 141 mmol/L (ref 135–145)

## 2023-02-11 LAB — VAS US ABI WITH/WO TBI: Right ABI: 0.79

## 2023-02-11 MED ORDER — CAMPHOR-MENTHOL 0.5-0.5 % EX LOTN
TOPICAL_LOTION | CUTANEOUS | Status: DC | PRN
  Filled 2023-02-11: qty 222

## 2023-02-11 MED ORDER — ONDANSETRON HCL 4 MG/2ML IJ SOLN
4.0000 mg | Freq: Three times a day (TID) | INTRAMUSCULAR | Status: DC | PRN
  Administered 2023-02-12 – 2023-02-16 (×4): 4 mg via INTRAVENOUS
  Filled 2023-02-11 (×4): qty 2

## 2023-02-11 MED ORDER — FREESTYLE LIBRE 3 READER DEVI: 1.0000 | 1 refills | Status: DC

## 2023-02-11 NOTE — Progress Notes (Signed)
Patient is NPO but as instructed by doctor, okay to give 0400 dose of by mouth potassium

## 2023-02-11 NOTE — Consult Note (Signed)
  Subjective:  [name], female    DOB: 1961/11/05,  MRN: [id number]   Objective:   Vitals:   02/11/23 0619 02/11/23 0753  BP:  (!) 147/60  Pulse:  72  Resp:  18  Temp:  98.2 F (36.8 C)  SpO2: 93% 93%   General AA&O x3. Normal mood and affect.  Vascular Dorsalis pedis and posterior tibial pulses 2/4 bilat. Brisk capillary refill to all digits. Pedal hair present.  Neurologic Epicritic sensation grossly intact.  Dermatologic  No lacerations or abrasions bilateral feet. Nails 1-5 bilateral  are thickened discolored and elongated with subungual debris.  Medial right hallux ulceration measuring about 4 cm x 2 cm x 0.2 cm with fibrotic necrotic base base. Mkore erythema and edema noted today with probe to bone today.  Does appear duskier to distal hallux than previous and some mild erythema.   Orthopedic: MMT 5/5 in dorsiflexion, plantarflexion, inversion, and eversion. Normal joint ROM without pain or crepitus.   X-rays with osseous erosion noted to phalanx of right hallux concerning for osteomyelitis.   Great toe soft tissue ulcer with underlying osteomyelitis of the great toe proximal and distal phalanx, and septic arthritis of the IP joint with joint destruction. Adjacent cellulitis without evidence of drainable soft tissue abscess.      Assessment & Plan:  Patient was evaluated and treated and all questions answered.  Ulcer right medial hallux with necrosis of bone.  -All questions and concerns were discussed with the patient in extensive detail -Patient will need a right great toe amputation during this admission however ABIs PVRs were reviewed which shows right moderate vascular disease -Patient will benefit from vascular consult consultation -Will plan for the OR for amputation after patient has been optimized from vascular standpoint -Weightbearing as tolerated with surgical shoe -Betadine wet-to-dry dressing   Felipa Furnace, DPM  Accessible via  secure chat for questions or concerns.
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
## 2023-02-11 NOTE — Plan of Care (Signed)
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
## 2023-02-11 NOTE — Progress Notes (Signed)
Internal Medicine Attending:   I saw and examined the patient. I reviewed the resident's H&P note and I agree with the resident's findings and plan as documented in the resident's note.  In brief, patient is a 62 year old female with a past medical history of obesity, hypertension, hyperlipidemia, type 2 diabetes, tobacco use, peripheral arterial disease, venous insufficiency, CKD stage II, chronic diastolic heart failure, chronic back pain on opioids and status post left AKA who presented to the ED with a wound over her right big toe.  Patient initially developed a wound under her right big toe approximately 1 month ago and was seen and [facility] for this and was later started on antibiotics (doxycycline) for possible infection.  Patient had worsening pain and nonhealing wound and was referred to podiatry for further evaluation.  She was sent to the ED by podiatry for possible osteomyelitis.  Imaging done in the ED was consistent with osteomyelitis in the right proximal and distal phalanx in her right big toe as well as septic arthritis in the interphalangeal joint with adjacent cellulitis.  On exam today, patient is lying in bed in no apparent distress.  Lungs are clear to auscultation bilaterally.  Cardiovascular exam reveals regular rate and rhythm with normal heart sounds.  Abdomen is soft, nontender, nondistended with normoactive bowel sounds.  Patient is tearful.  Patient with a right AKA and left foot shows a necrotic lesion over her right great toe.  Patient was started on broad-spectrum antibiotics (cefepime and vancomycin) for this.  Podiatry to evaluate the patient.  Suspect patient will need a TMA.  Continue with pain control for now.  No further workup at this time.  Of note, patient did have ABIs done which showed moderate arterial disease in her right lower extremity.  Will need to have vascular surgery evaluate the patient as well to see if they could improve blood flow prior to surgery.

## 2023-02-11 NOTE — Consult Note (Signed)
Hospital Consult    Reason for Consult:  PAD Requesting Physician:  Blenda Mounts MRN #:  [id number]  History of Present Illness: This is a 62 y.o. female who presented to the hospital with right foot wound and has been followed by podiatry.  She has hx of left BKA.    She was seen by Dr. Oneida Alar in 2016 and has hx of right CIA stent in 2008 by Dr. Oneida Alar and left CIA stent in 2005 by IR.  She had BKA in 2014 by Dr. Sharol Given.    Pt was seen by podiatry in their office and there was concern for worsening wound with osteomyelitis and most likely needing a TMA.  She had ABI today and was 0.79 with multiphasic waveforms.  Vascular surgery is consulted.  She states that she has pain in her foot at night but does not get better when she puts it on the floor.  She states she gets pain in her calf when walking and has pain in her left leg as well.  She states that the cramping in the calf varies with distance and is not always the same.  She does not recall follow up for the stents she had placed.  She continues to smoke.  She walks with a prosthesis.    Pt has hx of CHF, fibromyalgia, GERD, neuropathy, DM.  She also has hx of SBE and treated with IV abx in 2014.   The pt is on a statin for cholesterol management.  The pt is on a daily aspirin.   Other AC:  none The pt is on CCB, BB, diuretic for hypertension.   The pt is diabetic.   Tobacco hx:  current  Past Medical History:  Diagnosis Date   Acute vestibular syndrome, resolved 03/02/2021   Angioedema 06/14/2021   Asthma    Burn of finger of right hand, second degree 02/05/2021   Occurred during cooking (frying), poor sensation due to neuropathy, pt punctured blister to allow it to drain, skin has since desquamated over dorsal joint, no infection.  Keep clean and dry, OTC antibacterial ointment.   Cataract    CHF (congestive heart failure) (HCC)    Chronic bronchitis ([place])    "I get it alot" (09/28/2013)   Chronic diastolic heart failure (HCC)     grade 2 per 2D echocardiogram (01/2013)   Chronic kidney disease    Chronic lower back pain    Chronic pain syndrome 12/03/2011   Likely secondary to depression, "fibromyalgia", neuropathy, and obesity. Lumbar MRI 2014 no sig change from prior (2008) : Stable hypertrophic facet disease most notable at L4-5. Stable shallow left foraminal/extraforaminal disc protrusion at L4-5. No direct neural compression.       Chronic right shoulder pain 10/10/2021   COPD 01/08/2007   PFT's 05/2007 : FEV1/FVC 82, FEV1 64% pred, FEF 25-75% 40% predicted, 16% improvement in FEV1 with bronchodilators.      Depression    Diabetic peripheral neuropathy (HCC)    Dizziness, resolved (admitted with vestibular migraine)    DVT of upper extremity (deep vein thrombosis) ([place]) 03/11/2013   Secondary to PICC line. Right brachial vein, diagnosed on 03/10/2013 Coumadin for 3 months. End date 06/10/2013    Environmental allergies    Hx: of   Fatty liver 2003   observed on ultrasound abdomen   Fibromyalgia    GERD (gastroesophageal reflux disease)    Glaucoma    Headache    History of bacterial endocarditis 2014   Endocarditis involving  mitral and tricuspid valves.  S. Aureus and GBS.    History of use of hearing aid    Hyperlipidemia    Hyperplastic colon polyp 12/2010   Per colonoscopy (12/2010) - Dr. 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  Evaluated on 07/08/2021 by podiatrist Dr. Amalia Hailey who debrided necrotic tissue and prescribed doxycycline.  He will see her again in 3 weeks.  The location of this ulcer on the dorsal aspect of the toe is somewhat atypical for a purely diabetic foot wound, and she does have a strong DP pulse.  I did not examine h   Type II diabetes mellitus with peripheral circulatory disorders, uncontrolled DX: 1993   Insulin dep. Poor control. Complicated by diabetic foot ulcer and diabetic eye disease.     Upper respiratory tract infection due to COVID-19 virus 07/22/2022    Past Surgical History:  Procedure Laterality Date   ABDOMINAL HYSTERECTOMY  1997   secondary to uterine fibroids   AMPUTATION Left 08/31/2013   Procedure: AMPUTATION RAY;  Surgeon: Newt Minion, MD;  Location: [employer];  Service: Orthopedics;  Laterality: Left;  Left Foot 5th Ray Amputation   AMPUTATION Left 09/28/2013   Procedure: Left Midfoot amputation;  Surgeon: Newt Minion, MD;  Location: [place];  Service: Orthopedics;  Laterality: Left;  Left Midfoot amputation   AMPUTATION Left 10/14/2013   Procedure: AMPUTATION BELOW KNEE- left;  Surgeon: Newt Minion, MD;  Location: [place];  Service: Orthopedics;  Laterality: Left;  Left Below Knee Amputation    AMPUTATION TOE Right 01/15/2017   Procedure: AMPUTATION 5th TOE RIGHT FOOT;  Surgeon: Edrick Kins, DPM;  Location: [place];  Service: Podiatry;  Laterality: Right;   APPLICATION OF WOUND VAC  04/01/2019   Procedure: Application Of Wound Vac;  Surgeon: Newt Minion, MD;  Location: [place];  Service: Orthopedics;;   BLADDER SURGERY     bladder reconstruction surgery   BOTOX INJECTION N/A 08/21/2021   Procedure: CYSTOSCOPY BOTOX INJECTION;  Surgeon: Lucas Mallow, MD;  Location: [facility];  Service: Urology;  Laterality: N/A;   BREAST BIOPSY     multiple-benign per pt   CATARACT EXTRACTION, BILATERAL     summer 2022    COLONOSCOPY     DILATION AND CURETTAGE OF UTERUS  1985   ESOPHAGOGASTRODUODENOSCOPY N/A 09/20/2013   Procedure: ESOPHAGOGASTRODUODENOSCOPY (EGD);  Surgeon: Jerene Bears, MD;  Location: [place];  Service: Gastroenterology;  Laterality: N/A;   EYE SURGERY Bilateral 2022   Cataract removal in June and then July   FOOT AMPUTATION THROUGH METATARSAL Left 09/28/2013   GANGLION CYST EXCISION     multiple   PAROTIDECTOMY Right 05/14/2022   Procedure: PAROTIDECTOMY;  Surgeon: Melida Quitter, MD;  Location: [place];  Service: ENT;  Laterality: Right;   PERIPHERAL VASCULAR INTERVENTION     stents in lower ext   PREAURICULAR CYST EXCISION N/A 05/14/2022   Procedure: EXCISION OF SCALP SKIN CYST, 1.5cm;  Surgeon: Melida Quitter, MD;  Location: [place];  Service: ENT;  Laterality: N/A;   SHOULDER ARTHROSCOPY Right 11/11/2019   RIGHT SHOULDER ARTHROSCOPY AND DEBRIDEMENT    SHOULDER ARTHROSCOPY Right 11/11/2019   Procedure: RIGHT SHOULDER ARTHROSCOPY AND DEBRIDEMENT;  Surgeon: Newt Minion, MD;  Location: [place];  Service: Orthopedics;  Laterality: Right;   SHOULDER ARTHROSCOPY W/ ROTATOR CUFF REPAIR Bilateral    2 on right one on left   SKIN SPLIT GRAFT Bilateral 05/13/2013   Procedure: Right and Left Foot Allograft Skin Graft;  Surgeon: Newt Minion, MD;  Location: [place];  Service: Orthopedics;  Laterality: Bilateral;  Right and Left Foot Allograft Skin Graft   STUMP REVISION Left 04/01/2019   Procedure: REVISION LEFT BELOW KNEE AMPUTATION;  Surgeon: Newt Minion, MD;  Location: [place];  Service: Orthopedics;  Laterality: Left;   TEE WITHOUT CARDIOVERSION N/A 01/31/2013   Procedure: TRANSESOPHAGEAL ECHOCARDIOGRAM (TEE);  Surgeon: Fay Records, MD;  Location[facility] END[facility];  Service: Cardiovascular;  Laterality: N/A;  Rm [id number]   TEE WITHOUT CARDIOVERSION N/A 03/10/2013   Procedure: TRANSESOPHAGEAL ECHOCARDIOGRAM (TEE);  Surgeon: Larey Dresser, MD;  Location: [facility] ENDOSCOPY;  Service: Cardiovascular;   Laterality: N/A;  Rm. [id number]   TOE AMPUTATION Left 08/31/2013   4TH & 5 TH TOE    TONSILLECTOMY     TUBAL LIGATION     WRIST SURGERY Right    "for tumors" (09/28/2013)    Allergies  Allergen Reactions   Benazepril Swelling    Possible tongue swelling 06/14/2021   Chantix [Varenicline]     Unknown reaction   Ioversol    Morphine Sulfate    Pineapple Swelling    Fresh pineapple - lip swelling   Abilify [Aripiprazole] Other (See Comments)    Urinary freq Nov 2016   Iohexol Other (See Comments)    IV CONTRAST CAUSED TRANSIENT NEPHROPATHY (KIDNEY INSUFFICIENCY) IN 2007    Ivp Dye [Iodinated Contrast Media] Other (See Comments)    IV CONTRAST CAUSED TRANSIENT NEPHROPATHY (KIDNEY INSUFFICIENCY) IN 2007    Prior to Admission medications   Medication Sig Start Date End Date Taking? Authorizing Provider  acetaminophen (TYLENOL) 650 MG CR tablet Take 2 tablets (1,300 mg total) by mouth every 8 (eight) hours as needed for pain. 10/21/22   Angelica Pou, MD  albuterol [facility] HFA) 108 [phone] Base) MCG/ACT inhaler INHALE 2 PUFFS BY MOUTH EVERY 6 HOURS AS NEEDED FOR WHEEZING 10/27/22   Angelica Pou, MD  amLODipine (NORVASC) 10 MG tablet Take 1 tablet (10 mg total) by mouth daily. 10/23/22   Angelica Pou, MD  aspirin EC 81 MG tablet Take 1 tablet (81 mg total) by mouth daily. 09/19/21   Angelica Pou, MD  baclofen (LIORESAL) 10 MG tablet Take 0.5 tablets (5 mg total) by mouth 2 (two) times daily. 12/05/22   Angelica Pou, MD  Continuous Blood Gluc Receiver (FREESTYLE LIBRE 3 READER) DEVI 1 each by Does not apply route continuous. Use to continuously monitor blood glucose 02/11/23   Angelica Pou, MD  Continuous Blood Gluc Sensor (FREESTYLE LIBRE 3 SENSOR) MISC 1 each by Does not apply route 6 (six) times daily. Place 1 sensor on the skin every 14 days. Use to check glucose continuously 03/06/22   Delene Ruffini,  MD  DULoxetine (CYMBALTA) 30 MG capsule Take 1  capsule (30 mg total) by mouth 2 (two) times daily. 10/23/22   Angelica Pou, MD  hydrochlorothiazide (HYDRODIURIL) 25 MG tablet Take 1 tablet (25 mg total) by mouth daily. 10/23/22 10/18/23  Angelica Pou, MD  insulin degludec (TRESIBA FLEXTOUCH) 200 UNIT/ML FlexTouch Pen INJECT 67 UNITS UNDER THE SKIN DAILY 01/26/23   Angelica Pou, MD  Insulin Pen Needle 32G X 4 MM MISC Use to inject insulin 4 times a day and semaglutide once weekly 11/04/22   Angelica Pou, MD  Lancets MISC Use as directed 10/27/22   Angelica Pou, MD  metFORMIN (GLUCOPHAGE) 500 MG tablet Take 1 tablet (500 mg total) by mouth 2 (two) times daily. 10/23/22   Angelica Pou, MD  metoprolol succinate (TOPROL-XL) 100 MG 24 hr tablet Take 1 tablet (100 mg total) by mouth daily. TAKE WITH OR IMMEDIATELY FOLLOWING A MEAL. 10/23/22   Angelica Pou, MD  Multiple Vitamin (MULTIVITAMIN) tablet Take 1 tablet by mouth daily.    [provider]  MYRBETRIQ 50 MG TB24 tablet Take 1 tablet (50 mg total) by mouth daily. 01/12/23   Angelica Pou, MD  nystatin powder Apply 1 application  topically 3 (three) times daily as needed. 06/04/22   Angelica Pou, MD  OVER THE COUNTER MEDICATION Take 1 tablet by mouth daily. [employer] otc supplement    [provider]  oxybutynin (DITROPAN-XL) 10 MG 24 hr tablet Take 10 mg by mouth daily. 10/03/22   [provider]  oxyCODONE (OXY IR/ROXICODONE) 5 MG immediate release tablet Take 1 tablet (5 mg total) by mouth every 4 (four) hours as needed for severe pain. 02/09/23   Angelica Pou, MD  pantoprazole (PROTONIX) 40 MG tablet Take 1 tablet (40 mg total) by mouth daily. 10/23/22   Angelica Pou, MD  povidone-iodine (BETADINE) 10 % external solution Apply 1 Application topically as needed for wound care. 01/15/23   Lorenda Peck, DPM  pregabalin (LYRICA) 200 MG capsule TAKE 1 CAPSULE BY MOUTH THREE TIMES A DAY 02/01/23    Angelica Pou, MD  rosuvastatin (CRESTOR) 20 MG tablet Take 1 tablet (20 mg total) by mouth in the morning. 10/23/22   Angelica Pou, MD  Semaglutide, 1 MG/DOSE, (OZEMPIC, 1 MG/DOSE,) 4 MG/3ML SOPN Inject 1 mg into the skin once a week. 12/25/22   Angelica Pou, MD  traZODone (DESYREL) 100 MG tablet Take 2 tablets (200 mg total) by mouth at bedtime as needed for sleep. 10/23/22   Angelica Pou, MD  Potassium Chloride ER 20 MEQ TBCR Take 40 mEq by mouth daily. 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   Vaping Use   Vaping Use: Never used  Substance and Sexual  Activity   Alcohol use: No    Alcohol/week: 0.0 standard drinks of alcohol   Drug use: No    Types: Marijuana, "Crack" cocaine    Comment: 09/28/2013 "no marijuana since 03/18/10, no crack/cocaine 1989"   Sexual activity: Not Currently  Other Topics Concern   Not on file  Social History Narrative   On disability. Lives with son in [place]. Formerly worked as Training and development officer.    Boyfriend passed away stage 4 cancer 18-Mar-2013.   S/p L BKA 03-18-13. In wheelchair in paritially suitable apartment.    Social Determinants of Health   Financial Resource Strain: Low Risk  (10/25/2021)   Overall Financial Resource Strain (CARDIA)    Difficulty of Paying Living Expenses: Not very hard  Food Insecurity: No Food Insecurity (10/25/2021)   Hunger Vital Sign    Worried About Running Out of Food in the  Last Year: Never true    Ran Out of Food in the Last Year: Never true  Transportation Needs: No Transportation Needs (10/21/2022)   PRAPARE - Hydrologist (Medical): No    Lack of Transportation (Non-Medical): No  Physical Activity: Not on file  Stress: Not on file  Social Connections: Not on file  Intimate Partner Violence: Not on file    Family History  Problem Relation Age of Onset   Diverticulosis Mother    Diabetes Mother    Hypertension Mother    Congestive Heart Failure Mother    Asthma Father    CAD Sister 54       MI at age 63 per patient.  However, she has not had a stent or CABG.    Heart disease Sister        before age 66   Breast cancer Neg Hx     ROS: [x]$  Positive   [ ]$  Negative   [ ]$  All sytems reviewed and are negative  Cardiac: [x]$  CHF   Vascular: [x]$  pain in legs while walking [x]$  pain in legs at rest [x]$  pain in legs at night [x]$  non-healing ulcers [x]$  hx of DVT-due to PICC line 2013/03/18 []$  swelling in legs  Pulmonary: []$  asthma/wheezing []$  home O2  Neurologic: []$  hx of CVA []$  mini stroke   Hematologic: []$  hx of cancer  Endocrine:   [x]$  diabetes []$  thyroid disease  GI [x]$  GERD  GU: []$  CKD/renal failure []$  HD--[]$  M/W/F or []$  T/T/S  Psychiatric: []$  anxiety []$  depression  Musculoskeletal: [x]$  hx spinal stenosis  [x]$  fibromyalgia/chronic pain  Integumentary: []$  rashes [x]$  ulcers  Constitutional: []$  fever  []$  chills  Physical Examination  Vitals:   02/11/23 0619 02/11/23 0753  BP:  (!) 147/60  Pulse:  72  Resp:  18  Temp:  98.2 F (36.8 C)  SpO2: 93% 93%   Body mass index is 37.77 kg/m.  General:  WDWN in NAD Gait: Not observed HENT: WNL, normocephalic Pulmonary: normal non-labored breathing Cardiac: regular Abdomen:  soft, NT; aortic pulse is not palpable Skin: without rashes Vascular Exam/Pulses:  Right Left  Radial 2+ (normal) 2+ (normal)  Femoral 2+ (normal) Difficult to  palpate  DP 2+ (normal) amputation  PT Unable to palpate amputation   Extremities: non healing wound that is malodorous right great toe     Musculoskeletal: no muscle wasting or atrophy  Neurologic: A&O X 3 Psychiatric:  The pt has  flat  affect.   CBC    Component Value Date/Time   WBC 7.4 02/11/2023 0616  RBC 3.83 (L) 02/11/2023 0616   HGB 12.0 02/11/2023 0616   HGB 12.2 09/01/2022 1000   HCT 35.4 (L) 02/11/2023 0616   HCT 38.5 09/01/2022 1000   PLT 173 02/11/2023 0616   PLT 175 09/01/2022 1000   MCV 92.4 02/11/2023 0616   MCV 93 09/01/2022 1000   MCH 31.3 02/11/2023 0616   MCHC 33.9 02/11/2023 0616   RDW 14.6 02/11/2023 0616   RDW 14.4 09/01/2022 1000   LYMPHSABS 3.2 02/10/2023 1220   LYMPHSABS 2.7 09/01/2022 1000   MONOABS 0.4 02/10/2023 1220   EOSABS 0.1 02/10/2023 1220   EOSABS 0.1 09/01/2022 1000   BASOSABS 0.1 02/10/2023 1220   BASOSABS 0.1 09/01/2022 1000    BMET    Component Value Date/Time   NA 141 02/11/2023 0616   NA 145 (H) 09/01/2022 1000   K 3.6 02/11/2023 0616   CL 105 02/11/2023 0616   CO2 27 02/11/2023 0616   GLUCOSE 109 (H) 02/11/2023 0616   BUN 10 02/11/2023 0616   BUN 11 09/01/2022 1000   CREATININE 0.77 02/11/2023 0616   CREATININE 0.68 01/31/2015 1641   CALCIUM 8.3 (L) 02/11/2023 0616   GFRNONAA >60 02/11/2023 0616   GFRNONAA >89 01/31/2015 1641   GFRAA 54 (L) 10/25/2020 1131   GFRAA >89 01/31/2015 1641    COAGS: Lab Results  Component Value Date   INR 1.1 02/10/2023   INR 1.1 06/14/2021   INR 1.1 03/02/2021     Non-Invasive Vascular Imaging:    ABI 02/11/2023  Right:  0.79 multiphasic waveform   RLE arterial duplex 09/05/2021: +-----------+--------+-----+---------------+--------+--------+  RIGHT     PSV cm/sRatioStenosis       WaveformComments  +-----------+--------+-----+---------------+--------+--------+  CFA Prox   223                                            +-----------+--------+-----+---------------+--------+--------+  CFA Distal 189                                           +-----------+--------+-----+---------------+--------+--------+  DFA       80                                            +-----------+--------+-----+---------------+--------+--------+  SFA Prox   200          30-49% stenosis                  +-----------+--------+-----+---------------+--------+--------+  SFA Mid    207          30-49% stenosis                  +-----------+--------+-----+---------------+--------+--------+  SFA Distal 89                                            +-----------+--------+-----+---------------+--------+--------+  POP Prox   91                                            +-----------+--------+-----+---------------+--------+--------+  ATA Distal 77                                            +-----------+--------+-----+---------------+--------+--------+  PTA Distal 42                                            +-----------+--------+-----+---------------+--------+--------+  PERO Distal45                                            +-----------+--------+-----+---------------+--------+--------+   Peroneal artery not well visualized.   ABI 09/05/2021: ABI Findings:  +---------+------------------+-----+--------+--------+  Right   Rt Pressure (mmHg)IndexWaveformComment   +---------+------------------+-----+--------+--------+  Brachial 143                                      +---------+------------------+-----+--------+--------+  PTA     130               0.86 biphasic          +---------+------------------+-----+--------+--------+  DP      136               0.90 biphasic          +---------+------------------+-----+--------+--------+  Great Toe117               0.77                   +---------+------------------+-----+--------+--------+   Carotid  duplex 03/04/2021: 1-39% bilateral ICA stenosis   ASSESSMENT/PLAN: This is a 62 y.o. female with DM and neuropathy and osteomyelitis of right great toe with hx of bilateral CIA stenting in 2005 and 2008.    -pt has easily palpable right DP pulse.  Her right femoral pulse is also easily palpable.  ABI 0.79 with multiphasic waveforms.  Pt had a RLE arterial duplex in 2022 that revealed an SFA stenosis of 30-49% -given palpable DP pulse in her foot, she most likely has adequate blood flow for healing amputation.  She did have a mild SFA stenosis in 2022.   May need aorto-iliac duplex as I had a difficult time palpating her left femoral pulse.  -Dr. Stanford Breed to evaluate pt and determine further plan and if pt needs angiogram to optimize healing. -discussed importance of smoking with pt and that she is at risk of limb loss and is at higher risk given her diabetes.    Leontine Locket, PA-C [facility] [phone]

## 2023-02-11 NOTE — Progress Notes (Signed)
Mobility Specialist - Progress Note   02/11/23 1524  Mobility  Activity Ambulated with assistance in room  Level of Assistance Minimal assist, patient does 75% or more  Assistive Device Front wheel walker  Distance Ambulated (ft) 20 ft  Activity Response Tolerated well  Mobility Referral Yes  $Mobility charge 1 Mobility   Pt was received in bed and agreeable to mobility. No complaints throughout session. Pt was returned to bed with all needs met.  Franki Monte  Mobility Specialist Please contact via Solicitor or Rehab office at [phone]

## 2023-02-11 NOTE — Telephone Encounter (Signed)
Patient requests a reader for her freestyle libre 3.

## 2023-02-11 NOTE — Progress Notes (Signed)
ABI's have been completed. Preliminary results can be found in CV Proc through chart review.   02/11/23 12:31 PM Jennifer [name] RVT
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
## 2023-02-11 NOTE — Care Management Obs Status (Cosign Needed)
[place] NOTIFICATION   Patient Details  Name: Jennifer [name] MRN: [id number] Date of Birth: [birth year]   Medicare Observation Status Notification Given:  Yes    Curlene Labrum, RN 02/11/2023, 11:25 AM

## 2023-02-11 NOTE — H&P (View-Only) (Signed)
  Subjective:  [name], female    DOB: September 09, 1961,  MRN: [id number]   Objective:   Vitals:   02/11/23 0619 02/11/23 0753  BP:  (!) 147/60  Pulse:  72  Resp:  18  Temp:  98.2 F (36.8 C)  SpO2: 93% 93%   General AA&O x3. Normal mood and affect.  Vascular Dorsalis pedis and posterior tibial pulses 2/4 bilat. Brisk capillary refill to all digits. Pedal hair present.  Neurologic Epicritic sensation grossly intact.  Dermatologic  No lacerations or abrasions bilateral feet. Nails 1-5 bilateral  are thickened discolored and elongated with subungual debris.  Medial right hallux ulceration measuring about 4 cm x 2 cm x 0.2 cm with fibrotic necrotic base base. Mkore erythema and edema noted today with probe to bone today.  Does appear duskier to distal hallux than previous and some mild erythema.   Orthopedic: MMT 5/5 in dorsiflexion, plantarflexion, inversion, and eversion. Normal joint ROM without pain or crepitus.   X-rays with osseous erosion noted to phalanx of right hallux concerning for osteomyelitis.   Great toe soft tissue ulcer with underlying osteomyelitis of the great toe proximal and distal phalanx, and septic arthritis of the IP joint with joint destruction. Adjacent cellulitis without evidence of drainable soft tissue abscess.      Assessment & Plan:  Patient was evaluated and treated and all questions answered.  Ulcer right medial hallux with necrosis of bone.  -All questions and concerns were discussed with the patient in extensive detail -Patient will need a right great toe amputation during this admission however ABIs PVRs were reviewed which shows right moderate vascular disease -Patient will benefit from vascular consult consultation -Will plan for the OR for amputation after patient has been optimized from vascular standpoint -Weightbearing as tolerated with surgical shoe -Betadine wet-to-dry dressing   Felipa Furnace, DPM  Accessible via  secure chat for questions or concerns.

## 2023-02-12 ENCOUNTER — Encounter (HOSPITAL_COMMUNITY): Payer: Self-pay | Admitting: Internal Medicine

## 2023-02-12 ENCOUNTER — Encounter (HOSPITAL_COMMUNITY)
Admission: EM | Disposition: A | Discharge status: Home Health | Payer: Self-pay | Source: Ambulatory Visit | Attending: Internal Medicine

## 2023-02-12 ENCOUNTER — Other Ambulatory Visit: Payer: Self-pay

## 2023-02-12 ENCOUNTER — Ambulatory Visit (HOSPITAL_COMMUNITY): Admission: RE | Admit: 2023-02-12 | Payer: 59 | Source: Ambulatory Visit

## 2023-02-12 ENCOUNTER — Inpatient Hospital Stay (HOSPITAL_COMMUNITY): Payer: 59

## 2023-02-12 ENCOUNTER — Inpatient Hospital Stay (HOSPITAL_COMMUNITY): Payer: 59 | Admitting: Anesthesiology

## 2023-02-12 DIAGNOSIS — Z7985 Long-term (current) use of injectable non-insulin antidiabetic drugs: Secondary | ICD-10-CM

## 2023-02-12 DIAGNOSIS — E1165 Type 2 diabetes mellitus with hyperglycemia: Secondary | ICD-10-CM

## 2023-02-12 DIAGNOSIS — Z7984 Long term (current) use of oral hypoglycemic drugs: Secondary | ICD-10-CM

## 2023-02-12 DIAGNOSIS — E11628 Type 2 diabetes mellitus with other skin complications: Secondary | ICD-10-CM | POA: Diagnosis not present

## 2023-02-12 DIAGNOSIS — E1169 Type 2 diabetes mellitus with other specified complication: Secondary | ICD-10-CM

## 2023-02-12 DIAGNOSIS — M869 Osteomyelitis, unspecified: Secondary | ICD-10-CM

## 2023-02-12 DIAGNOSIS — L089 Local infection of the skin and subcutaneous tissue, unspecified: Secondary | ICD-10-CM | POA: Diagnosis not present

## 2023-02-12 DIAGNOSIS — I509 Heart failure, unspecified: Secondary | ICD-10-CM

## 2023-02-12 DIAGNOSIS — Z794 Long term (current) use of insulin: Secondary | ICD-10-CM

## 2023-02-12 DIAGNOSIS — I110 Hypertensive heart disease with heart failure: Secondary | ICD-10-CM

## 2023-02-12 DIAGNOSIS — F17210 Nicotine dependence, cigarettes, uncomplicated: Secondary | ICD-10-CM

## 2023-02-12 DIAGNOSIS — L03039 Cellulitis of unspecified toe: Secondary | ICD-10-CM | POA: Insufficient documentation

## 2023-02-12 HISTORY — PX: AMPUTATION TOE: SHX6595

## 2023-02-12 LAB — GLUCOSE, CAPILLARY
Glucose-Capillary: 124 mg/dL — ABNORMAL HIGH (ref 70–99)
Glucose-Capillary: 101 mg/dL — ABNORMAL HIGH (ref 70–99)
Glucose-Capillary: 98 mg/dL (ref 70–99)
Glucose-Capillary: 114 mg/dL — ABNORMAL HIGH (ref 70–99)
Glucose-Capillary: 100 mg/dL — ABNORMAL HIGH (ref 70–99)
Glucose-Capillary: 94 mg/dL (ref 70–99)

## 2023-02-12 LAB — BASIC METABOLIC PANEL
Anion gap: 12 (ref 5–15)
BUN: 6 mg/dL — ABNORMAL LOW (ref 8–23)
CO2: 24 mmol/L (ref 22–32)
Calcium: 8.5 mg/dL — ABNORMAL LOW (ref 8.9–10.3)
Chloride: 102 mmol/L (ref 98–111)
Creatinine, Ser: 0.73 mg/dL (ref 0.44–1.00)
GFR, Estimated: >60 mL/min (ref >60)
Glucose, Bld: 136 mg/dL — ABNORMAL HIGH (ref 70–99)
Potassium: 3.7 mmol/L (ref 3.5–5.1)
Sodium: 138 mmol/L (ref 135–145)

## 2023-02-12 LAB — CBC
HCT: 39.7 % (ref 36.0–46.0)
Hemoglobin: 13.3 g/dL (ref 12.0–15.0)
MCH: 31.1 pg (ref 26.0–34.0)
MCHC: 33.5 g/dL (ref 30.0–36.0)
MCV: 92.8 fL (ref 80.0–100.0)
Platelets: 176 10*3/uL (ref 150–400)
RBC: 4.28 MIL/uL (ref 3.87–5.11)
RDW: 14.6 % (ref 11.5–15.5)
WBC: 6.3 10*3/uL (ref 4.0–10.5)
nRBC: 0 % (ref 0.0–0.2)

## 2023-02-12 SURGERY — AMPUTATION, TOE
Anesthesia: General | Site: Foot | Laterality: Right

## 2023-02-12 MED ORDER — CHLORHEXIDINE GLUCONATE 0.12 % MT SOLN
OROMUCOSAL | Status: AC
  Filled 2023-02-12: qty 15

## 2023-02-12 MED ORDER — FENTANYL CITRATE (PF) 250 MCG/5ML IJ SOLN
INTRAMUSCULAR | Status: DC | PRN
  Administered 2023-02-12 (×2): 50 ug via INTRAVENOUS

## 2023-02-12 MED ORDER — OXYCODONE HCL 5 MG/5ML PO SOLN: 5.0000 mg | Freq: Once | ORAL | Status: DC | PRN

## 2023-02-12 MED ORDER — BUPIVACAINE HCL (PF) 0.5 % IJ SOLN
INTRAMUSCULAR | Status: AC
  Filled 2023-02-12: qty 30

## 2023-02-12 MED ORDER — BUPIVACAINE HCL (PF) 0.5 % IJ SOLN
INTRAMUSCULAR | Status: DC | PRN
  Administered 2023-02-12: 10 mL

## 2023-02-12 MED ORDER — ENOXAPARIN SODIUM 60 MG/0.6ML IJ SOSY
50.0000 mg | PREFILLED_SYRINGE | Freq: Every day | INTRAMUSCULAR | Status: DC
  Administered 2023-02-12 – 2023-02-15 (×4): 50 mg via SUBCUTANEOUS
  Filled 2023-02-12 (×4): qty 0.6

## 2023-02-12 MED ORDER — FENTANYL CITRATE (PF) 100 MCG/2ML IJ SOLN
INTRAMUSCULAR | Status: AC
  Filled 2023-02-12: qty 2

## 2023-02-12 MED ORDER — PHENYLEPHRINE 80 MCG/ML (10ML) SYRINGE FOR IV PUSH (FOR BLOOD PRESSURE SUPPORT)
PREFILLED_SYRINGE | INTRAVENOUS | Status: DC | PRN
  Administered 2023-02-12: 160 ug via INTRAVENOUS

## 2023-02-12 MED ORDER — LIDOCAINE 2% (20 MG/ML) 5 ML SYRINGE
INTRAMUSCULAR | Status: DC | PRN
  Administered 2023-02-12: 80 mg via INTRAVENOUS

## 2023-02-12 MED ORDER — AMLODIPINE BESYLATE 10 MG PO TABS
10.0000 mg | ORAL_TABLET | Freq: Every day | ORAL | Status: DC
  Administered 2023-02-13 – 2023-02-16 (×4): 10 mg via ORAL
  Filled 2023-02-12 (×4): qty 1

## 2023-02-12 MED ORDER — LACTATED RINGERS IV SOLN: INTRAVENOUS | Status: DC | PRN

## 2023-02-12 MED ORDER — PROPOFOL 10 MG/ML IV BOLUS
INTRAVENOUS | Status: AC
  Filled 2023-02-12: qty 20

## 2023-02-12 MED ORDER — LIDOCAINE HCL 2 % IJ SOLN
INTRAMUSCULAR | Status: DC | PRN
  Administered 2023-02-12: 10 mL

## 2023-02-12 MED ORDER — FENTANYL CITRATE (PF) 100 MCG/2ML IJ SOLN
25.0000 ug | INTRAMUSCULAR | Status: DC | PRN
  Administered 2023-02-12 (×2): 50 ug via INTRAVENOUS

## 2023-02-12 MED ORDER — CETAPHIL MOISTURIZING EX LOTN
TOPICAL_LOTION | Freq: Every day | CUTANEOUS | Status: DC
  Filled 2023-02-12: qty 473

## 2023-02-12 MED ORDER — PROPOFOL 10 MG/ML IV BOLUS
INTRAVENOUS | Status: DC | PRN
  Administered 2023-02-12: 180 mg via INTRAVENOUS

## 2023-02-12 MED ORDER — LUBRIDERM SERIOUSLY SENSITIVE EX LOTN
TOPICAL_LOTION | Freq: Two times a day (BID) | CUTANEOUS | Status: DC
  Filled 2023-02-12: qty 473

## 2023-02-12 MED ORDER — OXYCODONE HCL 5 MG PO TABS: 5.0000 mg | ORAL_TABLET | Freq: Once | ORAL | Status: DC | PRN

## 2023-02-12 MED ORDER — ONDANSETRON HCL 4 MG/2ML IJ SOLN: 4.0000 mg | Freq: Once | INTRAMUSCULAR | Status: DC | PRN

## 2023-02-12 MED ORDER — 0.9 % SODIUM CHLORIDE (POUR BTL) OPTIME
TOPICAL | Status: DC | PRN
  Administered 2023-02-12: 1000 mL

## 2023-02-12 MED ORDER — LIDOCAINE HCL 2 % IJ SOLN
INTRAMUSCULAR | Status: AC
  Filled 2023-02-12: qty 20

## 2023-02-12 MED ORDER — ONDANSETRON HCL 4 MG/2ML IJ SOLN
INTRAMUSCULAR | Status: DC | PRN
  Administered 2023-02-12: 4 mg via INTRAVENOUS

## 2023-02-12 MED ORDER — FENTANYL CITRATE (PF) 250 MCG/5ML IJ SOLN
INTRAMUSCULAR | Status: AC
  Filled 2023-02-12: qty 5

## 2023-02-12 MED ORDER — SUCCINYLCHOLINE CHLORIDE 200 MG/10ML IV SOSY
PREFILLED_SYRINGE | INTRAVENOUS | Status: DC | PRN
  Administered 2023-02-12: 180 mg via INTRAVENOUS

## 2023-02-12 SURGICAL SUPPLY — 37 items
BAG COUNTER SPONGE SURGICOUNT (BAG) ×2 IMPLANT
BAG SPNG CNTER NS LX DISP (BAG) ×1
BLADE LONG MED 31X9 (MISCELLANEOUS) IMPLANT
BNDG CMPR 9X4 STRL LF SNTH (GAUZE/BANDAGES/DRESSINGS) ×1
BNDG CONFORM 2 STRL LF (GAUZE/BANDAGES/DRESSINGS) ×2 IMPLANT
BNDG ELASTIC 3X5.8 VLCR STR LF (GAUZE/BANDAGES/DRESSINGS) ×2 IMPLANT
BNDG ESMARK 4X9 LF (GAUZE/BANDAGES/DRESSINGS) ×2 IMPLANT
BNDG GAUZE DERMACEA FLUFF 4 (GAUZE/BANDAGES/DRESSINGS) ×2 IMPLANT
BNDG GZE DERMACEA 4 6PLY (GAUZE/BANDAGES/DRESSINGS) ×1
CUFF TOURN SGL QUICK 18X4 (TOURNIQUET CUFF) IMPLANT
DRAPE U-SHAPE 47X51 STRL (DRAPES) IMPLANT
DRSG EMULSION OIL 3X3 NADH (GAUZE/BANDAGES/DRESSINGS) ×2 IMPLANT
DRSG XEROFORM 1X8 (GAUZE/BANDAGES/DRESSINGS) IMPLANT
DURAPREP 26ML APPLICATOR (WOUND CARE) ×2 IMPLANT
ELECT REM PT RETURN 9FT ADLT (ELECTROSURGICAL) ×1
ELECTRODE REM PT RTRN 9FT ADLT (ELECTROSURGICAL) ×2 IMPLANT
GAUZE PAD ABD 8X10 STRL (GAUZE/BANDAGES/DRESSINGS) IMPLANT
GAUZE SPONGE 4X4 12PLY STRL (GAUZE/BANDAGES/DRESSINGS) ×2 IMPLANT
GLOVE BIO SURGEON STRL SZ8 (GLOVE) ×4 IMPLANT
GOWN STRL REUS W/ TWL LRG LVL3 (GOWN DISPOSABLE) ×2 IMPLANT
GOWN STRL REUS W/ TWL XL LVL3 (GOWN DISPOSABLE) ×2 IMPLANT
GOWN STRL REUS W/TWL LRG LVL3 (GOWN DISPOSABLE) ×1
GOWN STRL REUS W/TWL XL LVL3 (GOWN DISPOSABLE) ×1
KIT BASIN OR (CUSTOM PROCEDURE TRAY) ×2 IMPLANT
NDL HYPO 25X1 1.5 SAFETY (NEEDLE) ×2 IMPLANT
NEEDLE HYPO 25X1 1.5 SAFETY (NEEDLE) ×1 IMPLANT
NS IRRIG 1000ML POUR BTL (IV SOLUTION) IMPLANT
PACK ORTHO EXTREMITY (CUSTOM PROCEDURE TRAY) ×2 IMPLANT
STAPLER VISISTAT 35W (STAPLE) IMPLANT
SUCTION FRAZIER HANDLE 10FR (MISCELLANEOUS) ×1
SUCTION TUBE FRAZIER 10FR DISP (MISCELLANEOUS) ×2 IMPLANT
SUT PROLENE 3 0 PS 1 (SUTURE) IMPLANT
SUT PROLENE 3 0 PS 2 (SUTURE) ×2 IMPLANT
SYR 10ML LL (SYRINGE) IMPLANT
TUBE CONNECTING 12X1/4 (SUCTIONS) ×2 IMPLANT
UNDERPAD 30X36 HEAVY ABSORB (UNDERPADS AND DIAPERS) ×2 IMPLANT
YANKAUER SUCT BULB TIP NO VENT (SUCTIONS) IMPLANT

## 2023-02-12 NOTE — Transfer of Care (Signed)
Immediate Anesthesia Transfer of Care Note  Patient: Jennifer [name]  Procedure(s) Performed: Right Foot Transmetatarsal Amputation (Right: Foot)  Patient Location: PACU  Anesthesia Type:General  Level of Consciousness: awake, alert , and oriented  Airway & Oxygen Therapy: Patient Spontanous Breathing  Post-op Assessment: Report given to RN, Post -op Vital signs reviewed and stable, and Patient moving all extremities X 4  Post vital signs: Reviewed and stable  Last Vitals:  Vitals Value Taken Time  BP 172/79 02/12/23 1841  Temp    Pulse 76 02/12/23 1844  Resp 18 02/12/23 1844  SpO2 93 % 02/12/23 1844  Vitals shown include unvalidated device data.  Last Pain:  Vitals:   02/12/23 1532  TempSrc: Oral  PainSc:          Complications: No notable events documented.

## 2023-02-12 NOTE — Anesthesia Procedure Notes (Signed)
Procedure Name: Intubation Date/Time: 02/12/2023 6:59 PM  Performed by: Carolan Clines, CRNAPre-anesthesia Checklist: Patient identified, Emergency Drugs available, Suction available and Patient being monitored Patient Re-evaluated:Patient Re-evaluated prior to induction Oxygen Delivery Method: Circle System Utilized Preoxygenation: Pre-oxygenation with 100% oxygen Induction Type: IV induction, Rapid sequence and Cricoid Pressure applied Laryngoscope Size: Mac and 3 Grade View: Grade II Tube type: Oral Tube size: 7.0 mm Number of attempts: 1 Airway Equipment and Method: Stylet Placement Confirmation: ETT inserted through vocal cords under direct vision, positive ETCO2 and breath sounds checked- equal and bilateral Secured at: 22 cm Tube secured with: Tape Dental Injury: Teeth and Oropharynx as per pre-operative assessment

## 2023-02-12 NOTE — Interval H&P Note (Signed)
History and Physical Interval Note:  02/12/2023 5:34 PM  Jennifer [name]  has presented today for surgery, with the diagnosis of FOOT INFECTION.  The various methods of treatment have been discussed with the patient and family. After consideration of risks, benefits and other options for treatment, the patient has consented to  RIGHT TRANSMETATARSAL AMPUTATION as a surgical intervention.  The patient's history has been reviewed, patient examined, no change in status, stable for surgery.  I have reviewed the patient's chart and labs.  Questions were answered to the patient's satisfaction.    Patient was scheduled for hallux amputation. She has previous 4th and 5th toe amputations. She brought up TMA and I do agree this would be a good option for her long term. After discussion with her (and her son on the phone) she wants to proceed with TMA (she is aware this is all the toes and part of the metatarsals). Consent signed.    Trula Slade

## 2023-02-12 NOTE — Plan of Care (Signed)
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
## 2023-02-12 NOTE — Progress Notes (Signed)
Mobility Specialist - Progress Note   02/12/23 1518  Mobility  Activity Ambulated with assistance in room  Level of Assistance Minimal assist, patient does 75% or more  Assistive Device Front wheel walker  Distance Ambulated (ft) 20 ft  Activity Response Tolerated fair  Mobility Referral Yes  $Mobility charge 1 Mobility   Pt was received in bed and reluctantly agreeable to mobility. Pt expressed concern for procedure tomorrow. Pt was returned to bed with all needs met  [place] Specialist Please contact via [facility] or Rehab office at [phone]
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
## 2023-02-12 NOTE — Op Note (Addendum)
02/12/2023  6:34 PM  PATIENT:  Jennifer [name]  62 y.o. female  PRE-OPERATIVE DIAGNOSIS:  Osteomyelitis right foot  POST-OPERATIVE DIAGNOSIS:  same  PROCEDURE:  Procedure(s): Right Foot Transmetatarsal Amputation (Right)  SURGEON:  Surgeon(s) and Role:    Trula Slade, DPM - Primary  PHYSICIAN ASSISTANT:   ASSISTANTS: none   ANESTHESIA:   general  EBL:  50 mL   BLOOD ADMINISTERED:none  DRAINS: none   LOCAL MEDICATIONS USED:  MARCAINE   , BUPIVICAINE , and Amount: 20 ml  SPECIMEN:  Source of Specimen:  toes for pathology; wound culture  DISPOSITION OF SPECIMEN:  PATHOLOGY  COUNTS:  YES  TOURNIQUET:   Total Tourniquet Time Documented: Calf (Right) - 16 minutes Total: Calf (Right) - 16 minutes   DICTATION: .Viviann Spare Dictation  PLAN OF CARE: Admit to inpatient   PATIENT DISPOSITION:  PACU - hemodynamically stable.   Delay start of Pharmacological VTE agent (>24hrs) due to surgical blood loss or risk of bleeding: no  Indications for surgery: 62 year old female to the hospital with osteomyelitis of the right hallux.  She previously underwent fourth amputation prior fifth ray amputation.  MRI confirmed osteomyelitis.  She was originally scheduled for hallux amputation however upon evaluation preop the patient wanted to discuss transmetatarsal potation.  We discussed this and she wants to proceed with a TMA.  Alternatives risks and complications were discussed.  No promises or guarantees given.  All questions answered.  Procedure in detail: Patient has been verbally and visually identified on his cell phone which is noted to be operatively.  She was then transferred to the operating room via stretcher and placed on operative table in supine position.  After adequate plane of anesthesia obtained she was intubated.  He the right lower extremity was scrubbed, prepped, draped in sterile fashion.    Timeout was performed.  At this time an incision was planned for the  transmetatarsal amputation.  Incision was made with a 10 blade scalpel.  Incision was then carried down with electrocautery down to the bone.  I was able to identify the metatarsals 1 through 5.  She was found to have good bleeding.  At this time the tourniquet was infiltrated without exsanguination to 250 mmHg.  I utilized a sagittal saw to resect the metatarsals 1 through 4 the fifth is already been resected.  Once the bone was cut the plantar flap was raised in the forefoot was disarticulated and passed off the table and sent to pathology.  At this time the remaining bones appear to be viable.  They are hard in nature, white in color.  There is no purulence or proximal tracking.  At this time hemostasis was achieved.  I copiously irrigated the wound with saline.  The incision was then closed with 3-0 Prolene as well as skin staples.  Xeroform was applied followed by dry sterile dressing.  Tourniquet released.  She was awoken from anesthesia and found to retract the procedure without any complications.  She was transferred to PACU vital signs and vascular status intact  Postop course: Patient's remain inpatient IV antibiotic for 24 to 48 hours.  She is nonweightbearing will need physical therapy evaluation tomorrow.  Continue antibiotics for now.  I called and updated the patients son post-op at her request. He had no further questions or concerns.

## 2023-02-13 ENCOUNTER — Encounter (HOSPITAL_COMMUNITY): Payer: Self-pay | Admitting: Podiatry

## 2023-02-13 DIAGNOSIS — E1142 Type 2 diabetes mellitus with diabetic polyneuropathy: Secondary | ICD-10-CM | POA: Diagnosis not present

## 2023-02-13 DIAGNOSIS — E1165 Type 2 diabetes mellitus with hyperglycemia: Secondary | ICD-10-CM | POA: Diagnosis not present

## 2023-02-13 DIAGNOSIS — Z7984 Long term (current) use of oral hypoglycemic drugs: Secondary | ICD-10-CM | POA: Diagnosis not present

## 2023-02-13 DIAGNOSIS — M86171 Other acute osteomyelitis, right ankle and foot: Secondary | ICD-10-CM | POA: Diagnosis not present

## 2023-02-13 LAB — GLUCOSE, CAPILLARY
Glucose-Capillary: 98 mg/dL (ref 70–99)
Glucose-Capillary: 112 mg/dL — ABNORMAL HIGH (ref 70–99)
Glucose-Capillary: 115 mg/dL — ABNORMAL HIGH (ref 70–99)
Glucose-Capillary: 135 mg/dL — ABNORMAL HIGH (ref 70–99)

## 2023-02-13 MED ORDER — HYDROMORPHONE HCL 1 MG/ML IJ SOLN
1.0000 mg | INTRAMUSCULAR | Status: DC | PRN
  Administered 2023-02-13 – 2023-02-14 (×6): 1 mg via INTRAVENOUS
  Filled 2023-02-13 (×6): qty 1

## 2023-02-13 MED ORDER — DIPHENHYDRAMINE HCL 25 MG PO CAPS
25.0000 mg | ORAL_CAPSULE | Freq: Once | ORAL | Status: AC
  Administered 2023-02-13: 25 mg via ORAL
  Filled 2023-02-13: qty 1

## 2023-02-13 MED ORDER — SENNA 8.6 MG PO TABS
1.0000 | ORAL_TABLET | Freq: Every day | ORAL | Status: DC
  Administered 2023-02-13: 8.6 mg via ORAL
  Filled 2023-02-13: qty 1
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
## 2023-02-13 NOTE — Progress Notes (Addendum)
OT Cancellation Note  Patient Details Name: Jennifer [name] MRN: [id number] DOB: January 04, 1961   Cancelled Treatment:    Reason Eval/Treat Not Completed: Patient declined, no reason specified Coordinated for pain premedication prior to OT attempts. On entry, room completely dark and pt reports she "wants it to stay dark in here". Refused any therapy attempts at this time. Pt agreeable for OT to follow up but hesitant to give a time for OT to check back.  Checked back shortly after 11AM as pt requested with room still dark and pt declining therapy participation. Will follow up tomorrow.  Layla Maw 02/13/2023, 8:56 AM

## 2023-02-13 NOTE — Care Management Important Message (Signed)
Important Message  Patient Details  Name: Jennifer [name] MRN: [id number] Date of Birth: [birth year]   Medicare Important Message Given:  Yes     Sophie Quiles [address] 02/13/2023, 2:37 PM

## 2023-02-13 NOTE — Progress Notes (Signed)
PT Cancellation Note  Patient Details Name: Jennifer [name] MRN: [id number] DOB: 07-31-1961   Cancelled Treatment:    Reason Eval/Treat Not Completed: Patient declined. OT had attempted earlier as well with pt premedicated and continues to decline. Will re-attempt tomorrow.   Shary Decamp [facility] 02/13/2023, 11:42 AM [place] Office [phone]

## 2023-02-13 NOTE — Plan of Care (Signed)
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
## 2023-02-14 ENCOUNTER — Encounter (HOSPITAL_COMMUNITY): Payer: Self-pay | Admitting: Internal Medicine

## 2023-02-14 DIAGNOSIS — E1142 Type 2 diabetes mellitus with diabetic polyneuropathy: Secondary | ICD-10-CM | POA: Diagnosis not present

## 2023-02-14 DIAGNOSIS — M86171 Other acute osteomyelitis, right ankle and foot: Secondary | ICD-10-CM | POA: Diagnosis not present

## 2023-02-14 DIAGNOSIS — R32 Unspecified urinary incontinence: Secondary | ICD-10-CM

## 2023-02-14 DIAGNOSIS — E1165 Type 2 diabetes mellitus with hyperglycemia: Secondary | ICD-10-CM | POA: Diagnosis not present

## 2023-02-14 DIAGNOSIS — Z7984 Long term (current) use of oral hypoglycemic drugs: Secondary | ICD-10-CM | POA: Diagnosis not present

## 2023-02-14 HISTORY — DX: Unspecified urinary incontinence: R32

## 2023-02-14 LAB — GLUCOSE, CAPILLARY
Glucose-Capillary: 107 mg/dL — ABNORMAL HIGH (ref 70–99)
Glucose-Capillary: 127 mg/dL — ABNORMAL HIGH (ref 70–99)
Glucose-Capillary: 129 mg/dL — ABNORMAL HIGH (ref 70–99)
Glucose-Capillary: 133 mg/dL — ABNORMAL HIGH (ref 70–99)
Glucose-Capillary: 155 mg/dL — ABNORMAL HIGH (ref 70–99)

## 2023-02-14 MED ORDER — AMOXICILLIN-POT CLAVULANATE 875-125 MG PO TABS: 1.0000 | ORAL_TABLET | Freq: Two times a day (BID) | ORAL | Status: DC

## 2023-02-14 MED ORDER — AMOXICILLIN-POT CLAVULANATE 875-125 MG PO TABS
1.0000 | ORAL_TABLET | Freq: Two times a day (BID) | ORAL | Status: DC
  Administered 2023-02-14 – 2023-02-16 (×5): 1 via ORAL
  Filled 2023-02-14 (×5): qty 1

## 2023-02-14 MED ORDER — SENNOSIDES-DOCUSATE SODIUM 8.6-50 MG PO TABS
1.0000 | ORAL_TABLET | Freq: Every day | ORAL | Status: DC
  Administered 2023-02-14: 1 via ORAL
  Filled 2023-02-14: qty 1

## 2023-02-14 MED ORDER — DIPHENHYDRAMINE HCL 25 MG PO CAPS
25.0000 mg | ORAL_CAPSULE | Freq: Once | ORAL | Status: AC
  Administered 2023-02-14: 25 mg via ORAL
  Filled 2023-02-14: qty 1
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
## 2023-02-15 LAB — CULTURE, BLOOD (ROUTINE X 2)
Culture: NO GROWTH
Culture: NO GROWTH
Special Requests: ADEQUATE

## 2023-02-15 LAB — COMPREHENSIVE METABOLIC PANEL
ALT: 9 U/L (ref 0–44)
AST: 13 U/L — ABNORMAL LOW (ref 15–41)
Albumin: 2.3 g/dL — ABNORMAL LOW (ref 3.5–5.0)
Alkaline Phosphatase: 68 U/L (ref 38–126)
Anion gap: 15 (ref 5–15)
BUN: 11 mg/dL (ref 8–23)
CO2: 21 mmol/L — ABNORMAL LOW (ref 22–32)
Calcium: 8.5 mg/dL — ABNORMAL LOW (ref 8.9–10.3)
Chloride: 104 mmol/L (ref 98–111)
Creatinine, Ser: 1.02 mg/dL — ABNORMAL HIGH (ref 0.44–1.00)
GFR, Estimated: >60 mL/min (ref >60)
Glucose, Bld: 87 mg/dL (ref 70–99)
Potassium: 3.6 mmol/L (ref 3.5–5.1)
Sodium: 140 mmol/L (ref 135–145)
Total Bilirubin: 0.8 mg/dL (ref 0.3–1.2)
Total Protein: 6.1 g/dL — ABNORMAL LOW (ref 6.5–8.1)

## 2023-02-15 LAB — GLUCOSE, CAPILLARY
Glucose-Capillary: 202 mg/dL — ABNORMAL HIGH (ref 70–99)
Glucose-Capillary: 208 mg/dL — ABNORMAL HIGH (ref 70–99)
Glucose-Capillary: 182 mg/dL — ABNORMAL HIGH (ref 70–99)
Glucose-Capillary: 214 mg/dL — ABNORMAL HIGH (ref 70–99)
Glucose-Capillary: 209 mg/dL — ABNORMAL HIGH (ref 70–99)

## 2023-02-15 MED ORDER — SENNOSIDES-DOCUSATE SODIUM 8.6-50 MG PO TABS
2.0000 | ORAL_TABLET | Freq: Every day | ORAL | Status: DC
  Administered 2023-02-15: 2 via ORAL
  Filled 2023-02-15 (×2): qty 2

## 2023-02-15 NOTE — Progress Notes (Addendum)
Subjective:   Doing all right today.  Complains of pain in her foot and says that she does not want to work with PT due to pain and until she gets her boot.  Objective:  Vital signs in last 24 hours: Vitals:   02/14/23 1538 02/14/23 2137 02/15/23 0545 02/15/23 0747  BP: (!) 149/54 (!) 136/58 (!) 159/75 125/67  Pulse: 91 89 75 62  Resp: '18 17 18 20  '$ Temp: 98.6 F (37 C)  98.3 F (36.8 C) 98.2 F (36.8 C)  TempSrc:   Oral Oral  SpO2: 92% 92% 92% 97%  Weight:      Height:       Physical Exam: Constitutional: not in acute distress, appears tired and sad CV: Regular rate and rhythm, no murmurs rubs or gallops Lungs: Normal work of breathing, no wheezes or crackles Abdominal: Soft. Non-distended. Minimal RLQ tenderness. Normal bowel sounds.  Musculoskeletal: LLE AKA, post-surgical wrap around right foot without surrounding erythema, drainage, or bleeding. Mild RLE swelling.  Neurological: Alert and oriented to person, place, and time.  Skin: warm and dry. Multiple small areas of healed scarring and scratch marks of the RLE.   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 female with past medical history of obesity, HTN, HLD, T2DM, tobacco use, PAD, venous insufficiency, CKD II, HFpEF, chronic back pain on opioids, and left AKA that presented with right foot wound and admitted for osteomyelitis.      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  Currently on day 2 of 5 of Augmentin.  Pain is controlled but she is not been able to work with physical therapy reliably.  Although she likely require SNF she is refusing and will be set up for home health PT/OT.  She is medically stable otherwise. She needs boot, wheelchair, bedside commode before she leaves -Continue Augmentin - Pain control with acetaminophen 1000 mg every 8, oxycodone 5 mg every 4 as needed and hydromorphone 1 mg every 3 as needed.  Will wean as able. -Home health PT/OT -F/u with Dr. Jacqualyn Posey (podiatry) in office next week   #Uncontrolled diabetes II with hyperglycemia #Peripheral neuropathy A1c 8.3% in 12/2022. Taking metformin, semaglutide, and insulin degludec 67U at home. Duloxetine and pregabalin at home for neuropathy.  This morning CBGs have been 200s, may increase sliding scale if this remains high. - Continue Duloxetine '30mg'$  q24 - Continue Pregabalin '100mg'$  q8 - SSI, moderate sensitivity - Trend CBG q4   #Nausea #  Abdominal Pain Remains constipated despite scheduled bowel regimen.  Likely secondary to immobility and opioids.  Will increase her senna. -Zofran 4 mg IV q8 hours PRN -Continue Miralax scheduled daily -Increase senna scheduled daily   #Pruritus Reports chronic diffuse pruritus.  This was likely secondary to her opioids.  She tolerates Benadryl well and may receive this as needed. - Sarna lotion -1x dose of benadryl   #Peripheral  artery disease #Tobacco use Hx of PAD and current smoker. On aspirin and atorvastatin at home. Counseled on tobacco cessation.  - Rosuvastatin '20mg'$  q24   #HFpEF Echo w/ EF 60-65% and G1DDD in 02/2021. Minimal RLE edema on exam.    #Chronic back pain On oxycodone at home.  - Continue Oxycodone '5mg'$  q4 PRN   #Urinary incontinence Taking oxybutynin and mirabegron at home. Reported itching of groin area w/ mildly erythematous rash on inner surface of proximal thighs bilaterally early in her hospitalization. - Continue Oxybutynin '30mg'$  q12 - Continue Mirabegron '50mg'$  q24 - Topical barrier cream   #HTN On amlodipine, hydrochlorothiazide, and metoprolol at home. BP remains stable.  - Continue amlodipine 10 mg daily    #CKD II Cr continued to be WNL.    #Hypokalemia - resolved   #Dispo Patient likely needs SNF but is refusing this and will go home with home health PT OT.  She does state that she lives with multiple family members and someone is always in the house 24/7 to assist as needed.  She is medically stable with only barrier to discharge being home health and medical equipment needing to be set up prior to discharge  Diet: HH/CM Bowel: mirilax, senna VTE: lovenox IVF: none Code: Full PT/OT recs: HH PT, wheelchair, bedside commode Anticipated Discharge Location: Home Barriers to Discharge: Medical improvement, medically stable Dispo: Anticipated discharge in approximately more than 2 day(s).   Delene Ruffini, MD 02/15/2023, 10:01 AM Pager: [phone] After 5pm on weekdays and 1pm on weekends: On Call pager [phone]

## 2023-02-15 NOTE — TOC Transition Note (Signed)
Transition of Care [facility]) - CM/SW Discharge Note   Patient Details  Name: Jennifer [name] MRN: [id number] Date of Birth: [birth year]  [facility]) CM/SW Contact:  Carles Collet, RN Phone Number: 02/15/2023, 12:17 PM   Clinical Narrative:     [facility] HH of DC potential today, requested WC and 3/1 through [employer] to be delivered to the room.  Per TOC assessment, patient will have family transport home.   Final next level of care: Home w Home Health Services Barriers to Discharge: Continued Medical Work up   Patient Goals and CMS Choice CMS Medicare.gov Compare Post Acute Care list provided to:: Patient Choice offered to / list presented to : Patient  Discharge Placement                         Discharge Plan and Services Additional resources added to the After Visit Summary for     Discharge Planning Services: CM Consult Post Acute Care Choice: Durable Medical Equipment, Home Health                               Social Determinants of Health (SDOH) Interventions SDOH Screenings   Food Insecurity: No Food Insecurity (10/25/2021)  Housing: Low Risk  (10/21/2022)  Transportation Needs: No Transportation Needs (10/21/2022)  Alcohol Screen: Low Risk  (10/25/2021)  Depression (PHQ2-9): Medium Risk (09/10/2022)  Financial Resource Strain: Low Risk  (10/25/2021)  Tobacco Use: High Risk (02/14/2023)     Readmission Risk Interventions    10/27/2021   10:18 AM  Readmission Risk Prevention Plan  Transportation Screening Complete  PCP or Specialist Appt within 3-5 Days Complete  HRI or [place] Complete  Social Work Consult for [place] Planning/Counseling Complete  Palliative Care Screening Not Applicable  Medication Review Press photographer) Complete

## 2023-02-15 NOTE — Progress Notes (Signed)
Orthopedic Tech Progress Note Patient Details:  Jennifer [name] 1961/08/17 [id number]  [name], female   DOB: 11-Dec-1961, 62 y.o.   MRN: [id number] Called order into [facility] as none of our boots will fit the patient properly. Karolee Stamps 02/15/2023, 7:10 AM

## 2023-02-15 NOTE — Progress Notes (Signed)
Mobility Specialist - Progress Note   02/15/23 1000  Mobility  Activity Dangled on edge of bed  Level of Assistance Modified independent, requires aide device or extra time  Assistive Device None  Activity Response Tolerated fair  Mobility Referral Yes  $Mobility charge 1 Mobility   Pt received in bed and agreeable to mobility. Pt was ModI to get EOB. Pt refused chair transfer. Pt was returned to bed with all needs met.  Franki Monte  Mobility Specialist Please contact via Solicitor or Rehab office at [phone]

## 2023-02-15 NOTE — Plan of Care (Signed)

## 2023-02-16 ENCOUNTER — Other Ambulatory Visit (HOSPITAL_COMMUNITY): Payer: Self-pay

## 2023-02-16 DIAGNOSIS — E1169 Type 2 diabetes mellitus with other specified complication: Secondary | ICD-10-CM

## 2023-02-16 LAB — AEROBIC CULTURE W GRAM STAIN (SUPERFICIAL SPECIMEN): Gram Stain: NONE SEEN

## 2023-02-16 LAB — GLUCOSE, CAPILLARY
Glucose-Capillary: 157 mg/dL — ABNORMAL HIGH (ref 70–99)
Glucose-Capillary: 213 mg/dL — ABNORMAL HIGH (ref 70–99)

## 2023-02-16 LAB — SURGICAL PATHOLOGY

## 2023-02-16 MED ORDER — METFORMIN HCL 500 MG PO TABS
500.0000 mg | ORAL_TABLET | Freq: Two times a day (BID) | ORAL | 0 refills | Status: DC
  Filled 2023-02-16: qty 60, 30d supply, fill #0

## 2023-02-16 MED ORDER — OXYCODONE HCL 5 MG PO TABS
7.5000 mg | ORAL_TABLET | ORAL | 0 refills | Status: DC | PRN
  Filled 2023-02-16: qty 9, 2d supply, fill #0

## 2023-02-16 MED ORDER — TRESIBA FLEXTOUCH 200 UNIT/ML ~~LOC~~ SOPN
PEN_INJECTOR | SUBCUTANEOUS | 0 refills | Status: DC
  Filled 2023-02-16: qty 9, 90d supply, fill #0

## 2023-02-16 MED ORDER — SENNOSIDES-DOCUSATE SODIUM 8.6-50 MG PO TABS
1.0000 | ORAL_TABLET | Freq: Every day | ORAL | 0 refills | Status: DC
  Filled 2023-02-16: qty 30, 30d supply, fill #0

## 2023-02-16 MED ORDER — LEVOFLOXACIN 750 MG PO TABS
750.0000 mg | ORAL_TABLET | Freq: Every day | ORAL | Status: DC
  Filled 2023-02-16: qty 1

## 2023-02-16 MED ORDER — CAMPHOR-MENTHOL 0.5-0.5 % EX LOTN
TOPICAL_LOTION | CUTANEOUS | 0 refills | Status: DC | PRN
  Filled 2023-02-16: qty 222, fill #0

## 2023-02-16 MED ORDER — POLYETHYLENE GLYCOL 3350 17 GM/SCOOP PO POWD
17.0000 g | Freq: Every day | ORAL | 0 refills | Status: DC | PRN
  Filled 2023-02-16: qty 238, 14d supply, fill #0

## 2023-02-16 MED ORDER — LEVOFLOXACIN 750 MG PO TABS
750.0000 mg | ORAL_TABLET | Freq: Every day | ORAL | 0 refills | Status: DC
  Filled 2023-02-16: qty 7, 7d supply, fill #0

## 2023-02-16 MED ORDER — GLUCOSE BLOOD VI STRP
ORAL_STRIP | Freq: Three times a day (TID) | 0 refills | Status: DC
  Filled 2023-02-16: qty 100, 33d supply, fill #0

## 2023-02-16 MED ORDER — ACETAMINOPHEN ER 650 MG PO TBCR: 1300.0000 mg | EXTENDED_RELEASE_TABLET | Freq: Three times a day (TID) | ORAL | 0 refills | Status: DC | PRN
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
## 2023-02-16 NOTE — Anesthesia Postprocedure Evaluation (Signed)
Anesthesia Post Note  Patient: Jennifer [name]  Procedure(s) Performed: Right Foot Transmetatarsal Amputation (Right: Foot)     Patient location during evaluation: PACU Anesthesia Type: General Level of consciousness: awake and alert Pain management: pain level controlled Vital Signs Assessment: post-procedure vital signs reviewed and stable Respiratory status: spontaneous breathing, nonlabored ventilation, respiratory function stable and patient connected to nasal cannula oxygen Cardiovascular status: blood pressure returned to baseline and stable Postop Assessment: no apparent nausea or vomiting Anesthetic complications: no   No notable events documented.  Last Vitals:  Vitals:   02/16/23 0530 02/16/23 0801  BP: 130/71 (!) 141/95  Pulse: 79 94  Resp: 17 16  Temp: 36.9 C 36.7 C  SpO2: 94% 97%    Last Pain:  Vitals:   02/16/23 0801  TempSrc: Oral  PainSc:                  [place]

## 2023-02-16 NOTE — Progress Notes (Signed)
    Durable Medical Equipment  (From admission, onward)           Start     Ordered   02/16/23 1057  For home use only DME Other see comment  Once       Comments: Slide board  Question:  Length of Need  Answer:  Lifetime   02/16/23 1057   02/15/23 0659  For home use only DME standard manual wheelchair with seat cushion  Once       Comments: Patient suffers from amputation and debility which impairs their ability to perform daily activities like bathing, dressing, grooming, and toileting in the home.  A cane, crutch, or walker will not resolve issue with performing activities of daily living. A wheelchair will allow patient to safely perform daily activities. Patient can safely propel the wheelchair in the home or has a caregiver who can provide assistance. Length of need Lifetime. Accessories: elevating leg rests (ELRs), wheel locks, extensions and anti-tippers.   02/15/23 0659   Unscheduled  For home use only DME Bedside commode  Once       Comments: Needs drop-arm bedside commode - to be used with the transfer board.  Question:  Patient needs a bedside commode to treat with the following condition  Answer:  Amputation of foot ([place])   02/16/23 1128

## 2023-02-16 NOTE — Progress Notes (Signed)
Mobility Specialist - Progress Note   02/16/23 1445  Mobility  Activity Dangled on edge of bed (lateral scoot)  Level of Assistance Contact guard assist, steadying assist  Assistive Device None  Activity Response Tolerated well  $Mobility charge 1 Mobility   Pt was received in bed and agreeable to session. Pt was able to get to EOB independently. Pt was required vocal cues for lateral scoot. Pt refused wheelchair transfer at this time. Pt left on EOB with all needs met.  Jennifer [name]  Mobility Specialist Please contact via Solicitor or Rehab office at [phone]

## 2023-02-16 NOTE — TOC Transition Note (Addendum)
Transition of Care [facility]) - CM/SW Discharge Note   Patient Details  Name: NICLOLE MCBATH MRN: [id number] Date of Birth: Sep 06, 1961  Transition of Care [facility]) CM/SW Contact:  Curlene Labrum, RN Phone Number: 02/16/2023, 11:35 AM   Clinical Narrative:    CM met with the patient at the bedside to discuss transitions of care needs to return home - S/P amputation of toes to foot per podiatry.  The patient has staples to foot and notes from podiatry to keep clean and dry and follow up with podiatry office in the next week.  Remind placed in the discharge instructions for patient to call the podiatry office if the office does not call her.  Home health orders are active and message sent to [place], [place] at [employer] to follow up in the home.  She is discharging home with family today by car.  I called [employer] and requested 3:1 be replaced with a drop arm 3:1 and requested a slide board be delivered to the patient's room.  Wheelchair is present in the hospital room.  Bedside nursing to discharge the patient home once her 3:1 is switched out and slide board is delivered to the hospital room.  Pending delivery of drop arm 3:1 and slide board to the hospital room.   Final next level of care: Home w Home Health Services Barriers to Discharge: Continued Medical Work up   Patient Goals and CMS Choice CMS Medicare.gov Compare Post Acute Care list provided to:: Patient Choice offered to / list presented to : Patient  Discharge Placement                         Discharge Plan and Services Additional resources added to the After Visit Summary for     Discharge Planning Services: CM Consult Post Acute Care Choice: Durable Medical Equipment, Home Health                               Social Determinants of Health (SDOH) Interventions SDOH Screenings   Food Insecurity: No Food Insecurity (10/25/2021)  Housing: Low Risk  (10/21/2022)  Transportation Needs: No Transportation Needs  (10/21/2022)  Alcohol Screen: Low Risk  (10/25/2021)  Depression (PHQ2-9): Medium Risk (09/10/2022)  Financial Resource Strain: Low Risk  (10/25/2021)  Tobacco Use: High Risk (02/14/2023)     Readmission Risk Interventions    10/27/2021   10:18 AM  Readmission Risk Prevention Plan  Transportation Screening Complete  PCP or Specialist Appt within 3-5 Days Complete  HRI or [place] Complete  Social Work Consult for [place] Planning/Counseling [place] Not Applicable  Medication Review [employer]) Complete    Attestation for Documentation:  I personally was present and have verified that the service and findings are accurately documented in this note.  [name], MD 02/16/2023, 12:03 PM

## 2023-02-16 NOTE — Discharge Summary (Signed)
Name: Jennifer [name] MRN: [id number] DOB: 10-20-1961 62 y.o. PCP: Angelica Pou, MD  Date of Admission: 02/10/2023 12:00 PM Date of Discharge: 02/16/2023 Attending Physician: Dr. Charise Killian  Discharge Diagnosis: 1. 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   Nausea and vomiting   Peripheral arterial disease with history of revascularization (HCC)   Opioid dependence, uncomplicated (HCC)   Generalized pruritus   Tobacco abuse   Urinary incontinence, mixed, urge/stress/functional   CKD stage 2 due to type 2 diabetes mellitus ([place])   Diabetic polyneuropathy associated with type 2 diabetes mellitus (HCC)   Polypharmacy   Chronic right shoulder pain   Edema of right lower extremity due to peripheral venous insufficiency   Skin lesions   HFpEF   PAD  Discharge Medications: Allergies as of 02/16/2023       Reactions   Benazepril Swelling   Possible tongue swelling 06/14/2021   Chantix [varenicline] Other (See Comments)   Unknown reaction   Ioversol    Morphine Sulfate    Pineapple Swelling   Fresh pineapple - lip swelling   Abilify [aripiprazole] Other (See Comments)   Urinary freq Nov 2016   Iohexol Other (See Comments)   IV CONTRAST CAUSED TRANSIENT NEPHROPATHY (KIDNEY INSUFFICIENCY) IN 2007   Ivp Dye [iodinated Contrast Media] Other (See Comments)   IV CONTRAST CAUSED TRANSIENT NEPHROPATHY (KIDNEY INSUFFICIENCY) IN 2007        Medication List     STOP taking these medications    hydrochlorothiazide 25 MG tablet Commonly known as: HYDRODIURIL       TAKE these medications    acetaminophen 650 MG CR  tablet Commonly known as: TYLENOL Take 2 tablets (1,300 mg total) by mouth every 8 (eight) hours as needed for pain.   albuterol 108 (90 Base) MCG/ACT inhaler Commonly known as: ProAir HFA INHALE 2 PUFFS BY MOUTH EVERY 6 HOURS AS NEEDED FOR WHEEZING What changed:  how much to take how to take this when to take this reasons to take this additional instructions   amLODipine 10 MG tablet Commonly known as: NORVASC Take 1 tablet (10 mg total) by mouth daily.   aspirin EC 81 MG tablet Take 1 tablet (81 mg total) by mouth daily.   baclofen 10 MG tablet Commonly known as: LIORESAL Take 0.5 tablets (5 mg total) by mouth 2 (two) times daily.   camphor-menthol lotion Commonly known as: SARNA Apply topically as needed for itching.   DULoxetine 30 MG capsule Commonly known as: CYMBALTA Take 1 capsule (30 mg total) by mouth 2 (two) times daily.   FreeStyle Libre 3 Reader [place] 1 each by Does not apply route continuous. Use to continuously monitor blood glucose   FreeStyle Libre 3 Sensor Misc 1 each by Does not apply route 6 (six) times daily. Place 1 sensor on the skin every 14 days. Use to check glucose continuously   Insulin Pen Needle 32G X 4 MM Misc Use to inject insulin 4 times a day and semaglutide once weekly   Lancets Misc Use as directed   metFORMIN 500 MG tablet Commonly known as: GLUCOPHAGE Take 1 tablet (500 mg total) by mouth 2 (two) times  daily.   metoprolol succinate 100 MG 24 hr tablet Commonly known as: TOPROL-XL Take 1 tablet (100 mg total) by mouth daily. TAKE WITH OR IMMEDIATELY FOLLOWING A MEAL. What changed: additional instructions   multivitamin tablet Take 1 tablet by mouth daily.   Myrbetriq 50 MG Tb24 tablet Generic drug: mirabegron ER Take 1 tablet (50 mg total) by mouth daily.   nystatin powder Commonly known as: nystatin Apply 1 application  topically 3 (three) times daily as needed. What changed: reasons to take this   OneTouch Verio  test strip Generic drug: glucose blood Use 3 (three) times daily.   OVER THE COUNTER MEDICATION Take 1 tablet by mouth daily. Brain Health otc supplement   oxybutynin 10 MG 24 hr tablet Commonly known as: DITROPAN-XL Take 10 mg by mouth daily.   oxyCODONE 5 MG immediate release tablet Commonly known as: Oxy IR/ROXICODONE Take 1.5 tablets (7.5 mg total) by mouth every 4 (four) hours as needed for severe pain. What changed: how much to take   Ozempic (1 MG/DOSE) 4 MG/3ML Sopn Generic drug: Semaglutide (1 MG/DOSE) Inject 1 mg into the skin once a week.   pantoprazole 40 MG tablet Commonly known as: PROTONIX Take 1 tablet (40 mg total) by mouth daily.   polyethylene glycol powder 17 GM/SCOOP powder Commonly known as: GLYCOLAX/MIRALAX Take 17 g by mouth daily as needed for mild constipation.   povidone-iodine 10 % external solution Commonly known as: Betadine Apply 1 Application topically as needed for wound care.   pregabalin 200 MG capsule Commonly known as: LYRICA TAKE 1 CAPSULE BY MOUTH THREE TIMES A DAY What changed:  how much to take how to take this when to take this additional instructions   rosuvastatin 20 MG tablet Commonly known as: CRESTOR Take 1 tablet (20 mg total) by mouth in the morning. What changed: when to take this   Senexon-S 8.6-50 MG tablet Generic drug: senna-docusate Take 1 tablet by mouth at bedtime.   traZODone 100 MG tablet Commonly known as: DESYREL Take 2 tablets (200 mg total) by mouth at bedtime as needed for sleep.   [name] FlexTouch 200 UNIT/ML FlexTouch Pen Generic drug: insulin degludec INJECT 20 UNITS UNDER THE SKIN DAILY What changed: additional instructions               Durable Medical Equipment  (From admission, onward)           Start     Ordered   02/16/23 1129  For home use only DME Bedside commode  Once       Comments: Needs drop-arm bedside commode - to be used with the transfer board.  Question:   Patient needs a bedside commode to treat with the following condition  Answer:  Amputation of foot ([place])   02/16/23 1128   02/16/23 1057  For home use only DME Other see comment  Once       Comments: Slide board  Question:  Length of Need  Answer:  Lifetime   02/16/23 1057   02/15/23 0659  For home use only DME standard manual wheelchair with seat cushion  Once       Comments: Patient suffers from amputation and debility which impairs their ability to perform daily activities like bathing, dressing, grooming, and toileting in the home.  A cane, crutch, or walker will not resolve issue with performing activities of daily living. A wheelchair will allow patient to safely perform daily activities. Patient can safely propel the wheelchair in  the home or has a caregiver who can provide assistance. Length of need Lifetime. Accessories: elevating leg rests (ELRs), wheel locks, extensions and anti-tippers.   02/15/23 0659              Discharge Care Instructions  (From admission, onward)           Start     Ordered   02/16/23 0000  Discharge wound care:       Comments: Keep right foot wrapped in boot, clean and dry until follow up appointment with podiatry   02/16/23 1308            Disposition and follow-up:   Jennifer [name] was discharged from [facility] in Good condition.  At the hospital follow up visit please address:  1.    A. Osteomyelitis of the right foot s/p TMA  - No outpatient abx or ID f/u necessary, will f/u in [facility] clinic, w/ wound care, and w/ podiatry as outpatient   B. Type 2 Diabetes  - Restarted metformin to increase to BID, decreased daily insulin degludec to 20 units daily (may need to titrate up in clinic)  2.  Labs / imaging needed at time of follow-up: CBC  3.  Pending labs/ test needing follow-up: none  Follow-up Appointments:  Follow-up Information     [facility] Follow up.   Specialty: Home Health  Services Why: [facility] will be providing home health services.  They will call you to set up services within 24-48 hours of your discharge to home. Contact information: [address] [place] [address] [phone]         Trula Slade, DPM Follow up.   Specialty: Podiatry Why: Please call the office to schedule an office visit in the next week. Contact information: [place] [address] [place] [phone]         [facility] [employer] Follow up.   Why: [employer] will be providing a drop arm 3:1 and slide board to your hospital room.  Wheelchair has already been delivered. Contact information: [address] [place] [address] [phone]                - Please follow up with your primary care provider at the [facility] on 02/23/2023 at 1:15 PM.  Phone: [phone] Address: [facility], [address] [place], [place], [place] [address]   - Please follow up with Dr. Fredirick Maudlin, MD at the [place] [facility] on 02/19/2023 at 9:00 AM  Phone: [phone] Address: [address], [place], [place] [address]  - Please follow up with Dr. Jacqualyn Posey (podiatry) (please call to schedule an appointment) Phone: [phone] Address: [address], [place] [address]   Hospital Course by problem list:  Jennifer [name] is a 62 y.o. female with past medical history of obesity, HTN, HLD, T2DM, tobacco use, PAD, venous insufficiency, CKD II, HFpEF, chronic back pain on opioids, and left AKA that presented with right foot wound and admitted for osteomyelitis.      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 Presented w/ worsening right foot wound in the setting of diabetic  neuropathy and chronic burns at home.  XRAY was concerning for osteomyelitis. Foot MRI confirmed osteomyelitis of the proximal/distal phalanx of first digit and interphalangeal joint septic arthritis. BC remained negative. ABIs w/ right moderate vascular disease. Vascular surgery was consulted and comfortable proceeding w/ amputation. S/p TMA with podiatry on 2/22. Was treated w/ IV vancomycin and cefepime prior to surgery. Initial plan by podiatry was to [facility] w/ 5 days of augmentin following surgery however wound cultures grew Alcaligenes Faecalis. Surgical path from her wound did return negative. Discussed these findings with ID (Dr. Candiss Norse) who states that since path is negative, no need for outpatient antibiotics or ID f/u as outpatient. Remained hemodynamically stable. Originally planned for SNF per PT however patient declined. She will go home with [facility] PT/OT, wheelchair, wheelchair cushion, and BSC 3in1. Patient will f/u with [facility] clinic, wound care, and Dr. Jacqualyn Posey (podiatry). Patient was taking oxycodone 5 mg q4 hours for chronic back pain at home. Discussed plan to increase to oxycodone 7.5 mg q4 hours for 1 day at home and then to return to prior 5 mg q4 hours dosing afterwards. Also discharged on tylenol. Will need to discuss pain control in clinic.   #Uncontrolled diabetes II with hyperglycemia #Peripheral neuropathy A1c was 8.3% in 12/2022. Home medications prior to hospitalization include metformin BID, semaglutide, and insulin degludec 67U. Treated w/ SSI throughout her hospitalization. Discharged with plan to increase metformin back up to BID, continued semaglutide, and decreased insulin degludec to 20 units daily to avoid hypoglycemia after hospitalization. Degludec dose will need to be titrated as needed during clinic visits (once daily dosing of degludec is best for this patient as I suspect insulin with meals would be overhwelming). Was also taking duloxetine and pregabalin for neuropathic pain.  Continued her duloxetine and pregabalin throughout her hospitalization and at discharge.    #Nausea #Abdominal Pain Noted nausea and abdominal pain early during her hospitalization. Nausea resolved with zofran. Continued to have mild RLQ abdominal pain. Had normal bowel sounds and minimal tenderness on exam. No rebound tenderness or guarding. Patient had not had a bowel movement in several days. Suspected abdominal pain was secondary to constipation. Discharged patient on mirilax and senna. Please reassess in clinic.   #Pruritus Patient reported chronic pruritus even prior to her hospitalization.  No insect bites or urticaria on exam.  Did have multiple small areas of healed scarring and scratch marks on her right lower extremity.  Patient states that she frequently picks at her skin.  Pruritus did not improve much with Benadryl. Suspect secondary to chronic dry skin changes. Provided her with Sarna lotion here and at discharge.    #Peripheral artery disease #Tobacco use Hx of PAD. Current smoker. Was taking aspirin and atorvastatin at home. Counseled on tobacco cessation. Continued  aspirin and atorvastatin at discharge.    #HFpEF Echo from 02/2021 w/ EF 60-65% and G1DDD. Remained euvolemic throughout her hospitalization with the exception of mild RLE swelling. Restarted home metoprolol at discharge.   #HTN Was taking amlodipine, hydrochlorothiazide, and metoprolol at home. BP was stable on home amlodipine. Discharged on home amlodipine and metoprolol. Held hydrochlorothiazide given well controlled BP while hospitalized. Could consider restarting hydrochlorothiazide in clinic.   #CKD II Cr remained WNL while hospitalized.    #Chronic back pain Was taking oxycodone at home. Continued home oxycodone 5 mg q4 PRN at home for chronic back pain.    #Urinary incontinence Was taking oxybutynin and mirabegron at home. Reported itching of groin area early in her hospitalization. Had mildly  erythematous  rash on inner surface of proximal thighs bilaterally. Treated with topical barrier cream. Symptoms improved. Discharged on home oxybutynin and mirabegron.    #Disposition PT originally recommended SNF however patient refused. Plan now for [facility] PT/OT. Patient also states she has multiple family members at home to help and that someone is home 24/7.   Discharge Exam:   BP (!) 141/95 (BP Location: Left Arm)   Pulse 94   Temp 98 F (36.7 C) (Oral)   Resp 16   Ht '5\' 5"'$  (1.651 m)   Wt 103 kg   SpO2 97%   BMI 37.77 kg/m  Constitutional: not in acute distress, talkative, pleasant CV: Regular rate and rhythm, no murmurs rubs or gallops Lungs: Normal work of breathing, no wheezes or crackles Abdominal: Soft. Non-distended. Minimal RLQ tenderness. Normal bowel sounds.  Musculoskeletal: LLE AKA, post-surgical boot on right foot without surrounding erythema, drainage, or bleeding. Mild RLE swelling.  Neurological: Alert and oriented to person, place, and time.  Skin: warm and dry. Multiple small areas of healed scarring and scratch marks of the RLE.   Pertinent Labs, Studies, and Procedures:     Latest Ref Rng & Units 02/12/2023    5:32 AM 02/11/2023    6:16 AM 02/10/2023   12:20 PM  CBC  WBC 4.0 - 10.5 K/uL 6.3  7.4  8.4   Hemoglobin 12.0 - 15.0 g/dL 13.3  12.0  13.0   Hematocrit 36.0 - 46.0 % 39.7  35.4  38.5   Platelets 150 - 400 K/uL 176  173  203        Latest Ref Rng & Units 02/15/2023    4:51 AM 02/12/2023    5:32 AM 02/11/2023    6:16 AM  BMP  Glucose 70 - 99 mg/dL 87  136  109   BUN 8 - 23 mg/dL '11  6  10   '$ Creatinine 0.44 - 1.00 mg/dL 1.02  0.73  0.77   Sodium 135 - 145 mmol/L 140  138  141   Potassium 3.5 - 5.1 mmol/L 3.6  3.7  3.6   Chloride 98 - 111 mmol/L 104  102  105   CO2 22 - 32 mmol/L '21  24  27   '$ Calcium 8.9 - 10.3 mg/dL 8.5  8.5  8.3     DG Foot Complete Right  IMPRESSION: Soft tissue ulceration along the medial great toe with underlying cortical  erosions of the great toe distal phalanx and medial aspect of the proximal phalanx, suspicious for osteomyelitis.   On: 02/10/2023 13:13  MR FOOT RIGHT W WO CONTRAST  IMPRESSION: Great toe soft tissue ulcer with underlying osteomyelitis of the great toe proximal and distal phalanx, and septic arthritis of the IP joint with joint destruction. Adjacent cellulitis without evidence of drainable soft tissue abscess.   On: 02/10/2023 16:10  DG Foot 2 Views Right  IMPRESSION: Status post transmetatarsal amputation   On: 02/12/2023 19:14  Discharge Instructions: Discharge Instructions     Call MD for:  persistant nausea and vomiting   Complete by: As directed    Call MD for:  persistant nausea and vomiting   Complete by: As directed    Call MD for:  redness, tenderness, or signs of infection (pain, swelling, redness, odor or green/yellow discharge around incision site)   Complete by: As directed    Call MD for:  redness, tenderness, or signs of infection (pain, swelling, redness, odor or green/yellow discharge around incision site)   Complete  by: As directed    Call MD for:  severe uncontrolled pain   Complete by: As directed    Call MD for:  severe uncontrolled pain   Complete by: As directed    Call MD for:  temperature >100.4   Complete by: As directed    Call MD for:  temperature >100.4   Complete by: As directed    Diet - low sodium heart healthy   Complete by: As directed    Diet - low sodium heart healthy   Complete by: As directed    Discharge wound care:   Complete by: As directed    Keep right foot wrapped in boot, clean and dry until follow up appointment with podiatry   Increase activity slowly   Complete by: As directed    Increase activity slowly   Complete by: As directed    No wound care   Complete by: As directed       You were hospitalized for a wound of your right foot.   Hospital Course: You underwent surgery of your right foot by podiatry. We gave  you antibiotics to decrease the risk of further infection of your right foot. After the results of your tissue came back, we will not need to send you home on antibiotics. We sent you home with oxycodone and tylenol to help control your pain after surgery. Because of your recent refill of oxycodone, we were unable to prescribe this medication from the hospital. For 1 day after you get out of the hospital you can take 7.5 mg (1.5 tablets) every 4 hours as needed for pain and then after this day we want you to return to your home dose of 5 mg every 4 hours as needed. We can discuss pain control in clinic as well. We discharged you with MiraLAX and senna to help with having bowel movements at home.  We discharged you with new test strips for your glucose meter. We are decreasing your insulin degludec to 20 units daily for now and will adjust as needed in clinic. Please start taking metformin again after discharge according to the medication instructions below.  Please use the Sarna lotion to help with your itching.  Unfortunately we do not have a loofah or shoe horn however you can get these items from [facility]. Please follow up with [place] in clinic, with wound care, and with podiatry as listed below.     Medications:   - Please start taking metformin 500 mg with the following schedule:   500 mg (1 tablet) daily with breakfast for 7 days (week 1)   Followed by:    500 mg (1 tablet) with breakfast and 500 mg (1 tablet) with dinner for 7 days (week 2 and after)     Physical Therapy/Occupational Therapy: We ordered home health physical therapy, occupational therapy to help you adapt to getting around your house after being hospitalized. We also ordered you a wheelchair and bedside commode to help at home.    Follow-up: - Please follow up with your primary care provider at the [facility] on 02/23/2023 at 1:15 PM.  Phone: [phone] Address: [facility], [place], [place], [place] [address]   - Please follow up with Dr. Fredirick Maudlin, MD at the [place] [facility] on 02/19/2023 at 9:00 AM  Phone: [phone] Address: [address], [place], [place] [address]  -  Please follow up with Dr. Jacqualyn Posey (podiatry) (please call to schedule an appointment) Phone: [phone] Address: [address], [place], [place] [address]   Signed: [name], MD 02/16/2023, 2:53 PM   Pager: [phone]

## 2023-02-16 NOTE — Consult Note (Addendum)
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   Nausea and vomiting   Peripheral arterial disease with history of revascularization (HCC)   Opioid dependence, uncomplicated (HCC)   Generalized pruritus   Tobacco abuse   Urinary incontinence, mixed, urge/stress/functional   CKD stage 2 due to type 2 diabetes mellitus ([place])   Diabetic polyneuropathy associated with type 2 diabetes mellitus (HCC)   Polypharmacy   Chronic right shoulder pain   Edema of right lower extremity due to peripheral venous insufficiency   Skin lesions   Urinary incontinence, severe, mixed (stress, urge, functional)   Assessment: 61 YF with right foot wound that started after her foot was under a heater about a month ago, she had increased pain and admitted:  #Right foot osteomyelitis SP right TMA on 2/22 with deep tissue Cx+ Alcaligenes faecalis and staph pseudintermedius(MRSP) on 2/22 #DM-A1c 8.3 #left foot AKA #Hx of foot ulcers - Communicated with Dr Jonny Ruiz and OR Cx growing organisms above were deep tissue Cx following debridement -Path report as below: "Gangrenous ulcer with underlying acute and gangrenous cellulitis  Underlying acute and chronic osteomyelitis  Proximal marrow free of acute inflammation  Proximal skin and soft tissue margin grossly viable" -Spoke with Pathology Dr. Alric Seton and he noted proximal margins are without osteo. The acute on chronic osteo in reports is referring to distal  margin - A faecalis is found in the environment and MRSP is uncommon in immunocompetent human hosts. MRSP is also rare in humans.  Given + OR Cx are polymicrobial/not bone, growing organisms that are likely contaminant and path negative for osteo will stop antibiotics.  Recommendations:  -Stop antibiotics. No ID F/U needed.   I have personally spent 120 minutes involved in face-to-face and non-face-to-face activities for this patient on the day of the visit. Professional time spent includes the following activities: Preparing to see the patient (review of tests), Obtaining and/or reviewing separately obtained history (admission/discharge record), Performing a medically appropriate examination and/or evaluation , Ordering medications/tests/procedures, referring and communicating with other health care professionals, Documenting clinical information in the EMR, Independently interpreting results (not separately reported), Communicating results to the patient/family/caregiver, Counseling and educating the patient/family/caregiver and Care coordination (not separately reported).   Microbiology:   Antibiotics: Vancomycin and cefepime 2/20-24 Augmentin 2/24-   HPI: Jennifer [name] is a 62 y.o. female with past medical history of obesity, hypertension hyperlipidemia, diabetes, CKD, venous sufficiency, PAD, CHF, chronic back pain on opioids, left AKA admitted with right foot wound.  About a month ago patient fell asleep with foot underneath the heater.  Her wound progressed gradually with increased pain.  On arrival vital stable.  X-ray showed cortical erosion suspicion for osteo of the first distal and proximal phalanx Podiatry consulted patient underwent TMA with cultures growing Alcaligenes faecalis and staph pseudintermedius.  Infectious disease engaged for antibiotic recommendations.  Review of Systems: Review of Systems  All other systems reviewed and are negative.  Past Medical History:   Diagnosis Date   Acute vestibular syndrome, resolved 03/02/2021   Angioedema 06/14/2021   Asthma    Burn of finger of right hand, second degree 02/05/2021   Occurred during cooking (frying), poor sensation due to neuropathy, pt punctured blister to allow it to drain, skin has since desquamated over dorsal joint, no infection.  Keep clean and dry, OTC antibacterial ointment.   Cataract    CHF (congestive heart failure) (HCC)    Chronic bronchitis ([place])    "I get it alot" (09/28/2013)   Chronic diastolic heart failure (HCC)    grade 2 per 2D echocardiogram (01/2013)   Chronic kidney disease    Chronic lower back pain    Chronic pain syndrome 12/03/2011   Likely secondary to depression, "fibromyalgia", neuropathy, and obesity. Lumbar MRI 2014 no sig change from prior (2008) : Stable hypertrophic facet disease most notable at L4-5. Stable shallow left foraminal/extraforaminal disc protrusion at L4-5. No direct neural compression.       Chronic right shoulder pain 10/10/2021   COPD 01/08/2007   PFT's 05/2007 : FEV1/FVC 82, FEV1 64% pred, FEF 25-75% 40% predicted, 16% improvement in FEV1 with bronchodilators.      Depression    Diabetic peripheral neuropathy (HCC)    Dizziness, resolved (admitted with vestibular migraine)    DVT of upper extremity (deep vein thrombosis) ([place]) 03/11/2013   Secondary to PICC line. Right brachial vein, diagnosed on 03/10/2013 Coumadin for 3 months. End date 06/10/2013    Environmental allergies    Hx: of   Fatty liver 2003   observed on ultrasound abdomen   Fibromyalgia    GERD (gastroesophageal reflux disease)    Glaucoma    H/O above knee amputation, left ([place]) 07/11/2021   Revision in 2020 to AKA from [place] 2014.   Headache    History of amputation of 4th and 5th toes right foot ([place]) 07/11/2021   Dr Amalia Hailey jan 2018 2/2 osteo   History of bacterial endocarditis 2014   Endocarditis involving mitral and tricuspid valves.  S. Aureus and GBS.    History of  use of hearing aid    Hyperlipidemia    Hyperplastic colon polyp 12/2010   Per colonoscopy (12/2010) - Dr. Deatra Ina   Hypertension    Juvenile rheumatoid arthritis [facility])    Diagnosed age 56; treated initially with "lots of aspirin"   Nausea and vomiting 11/24/2008   Parotid nodules, resected 05/2022, benign 07/11/2021   Incidental finding 03/04/21  "multiple bilateral parotid nodules the largest in the right gland measuring 11 mm", asymptomatic.       Initially evaluated 08/2019 with dedicated MRI: "IMPRESSION:  Skin marker overlies a 9 x 10 x 11 mm cyst within the anterior  aspect of the superficial lobe of the right parotid gland. This is  presumed to be a benign cyst. The possibility of a cystic Warthin's  tumor   Pneumonia    PVD (peripheral vascular disease) with claudication ([place])    Stents to bilateral common iliac arteries (left 2005, right 2008), on chronic plavix   Pyelonephritis 10/28/2020   S/P BKA (below knee amputation) unilateral ([place])    2014 L - failed limb preserving treatment. 2/2 tobacco use, DM, and cont weight bearing on surgical wound and developed gangrene    Spinal stenosis    Tobacco abuse    Toe ulcer, right 4th ([place]) leading to osteomylitis 07/08/2021   Right fourth toe turned dark, alerting her to abnormality, "it split  open and drained".  Evaluated on 07/08/2021 by podiatrist Dr. Amalia Hailey who debrided necrotic tissue and prescribed doxycycline.  He will see her again in 3 weeks.  The location of this ulcer on the dorsal aspect of the toe is somewhat atypical for a purely diabetic foot wound, and she does have a strong DP pulse.  I did not examine h   Type II diabetes mellitus with peripheral circulatory disorders, uncontrolled DX: 1993   Insulin dep. Poor control. Complicated by diabetic foot ulcer and diabetic eye disease.     Upper respiratory tract infection due to COVID-19 virus 07/22/2022   Urinary incontinence, severe, mixed (stress, urge, functional) 02/14/2023    Unresponsive to medications    Social History   Tobacco Use   Smoking status: Every Day    Packs/day: 0.50    Years: 50.00    Total pack years: 25.00    Types: Cigarettes    Start date: 12/22/1970   Smokeless tobacco: Never   Tobacco comments:    1/2 -1 PPD, Would like nicotene patches sent to help quit.   Vaping Use   Vaping Use: Never used  Substance Use Topics   Alcohol use: No    Alcohol/week: 0.0 standard drinks of alcohol   Drug use: No    Types: Marijuana, "Crack" cocaine    Comment: 09/28/2013 "no marijuana since 2011, no crack/cocaine 1989"    Family History  Problem Relation Age of Onset   Diverticulosis Mother    Diabetes Mother    Hypertension Mother    Congestive Heart Failure Mother    Asthma Father    CAD Sister 32       MI at age 3 per patient.  However, she has not had a stent or CABG.    Heart disease Sister        before age 63   Breast cancer Neg Hx    Scheduled Meds:  acetaminophen  1,000 mg Oral TID   amLODipine  10 mg Oral Daily   DULoxetine  30 mg Oral BID   enoxaparin (LOVENOX) injection  50 mg Subcutaneous QHS   Gerhardt's butt cream   Topical Daily   insulin aspart  0-15 Units Subcutaneous TID WC   insulin aspart  0-5 Units Subcutaneous QHS   lubriderm seriously sensitive   Topical BID   mirabegron ER  50 mg Oral Daily   oxybutynin  10 mg Oral Daily   pantoprazole  40 mg Oral Daily   pregabalin  100 mg Oral TID   rosuvastatin  20 mg Oral q AM   senna-docusate  2 tablet Oral QHS   Continuous Infusions: PRN Meds:.albuterol, camphor-menthol, HYDROmorphone (DILAUDID) injection, ondansetron (ZOFRAN) IV, oxyCODONE, polyethylene glycol, traZODone Allergies  Allergen Reactions   Benazepril Swelling    Possible tongue swelling 06/14/2021   Chantix [Varenicline] Other (See Comments)    Unknown reaction   Ioversol    Morphine Sulfate    Pineapple Swelling    Fresh pineapple - lip swelling   Abilify [Aripiprazole] Other (See Comments)     Urinary freq Nov 2016   Iohexol Other (See Comments)    IV CONTRAST CAUSED TRANSIENT NEPHROPATHY (KIDNEY INSUFFICIENCY) IN 2007    Ivp Dye [Iodinated Contrast Media] Other (See Comments)    IV CONTRAST CAUSED TRANSIENT NEPHROPATHY (KIDNEY INSUFFICIENCY) IN 2007    OBJECTIVE: Blood pressure (!) 141/95, pulse 94, temperature 98 F (36.7 C), temperature source Oral, resp. rate 16, height '5\' 5"'$  (1.651 m), weight 103 kg,  SpO2 97 %.  Physical Exam Constitutional:      Appearance: Normal appearance.  HENT:     Head: Normocephalic and atraumatic.     Right Ear: Tympanic membrane normal.     Left Ear: Tympanic membrane normal.     Nose: Nose normal.     Mouth/Throat:     Mouth: Mucous membranes are moist.  Eyes:     Extraocular Movements: Extraocular movements intact.     Conjunctiva/sclera: Conjunctivae normal.     Pupils: Pupils are equal, round, and reactive to light.  Cardiovascular:     Rate and Rhythm: Normal rate and regular rhythm.     Heart sounds: No murmur heard.    No friction rub. No gallop.  Pulmonary:     Effort: Pulmonary effort is normal.     Breath sounds: Normal breath sounds.  Abdominal:     General: Abdomen is flat.     Palpations: Abdomen is soft.  Musculoskeletal:     Comments: Right foot bandaged  Skin:    General: Skin is warm and dry.  Neurological:     General: No focal deficit present.     Mental Status: She is alert and oriented to person, place, and time.  Psychiatric:        Mood and Affect: Mood normal.     Lab Results Lab Results  Component Value Date   WBC 6.3 02/12/2023   HGB 13.3 02/12/2023   HCT 39.7 02/12/2023   MCV 92.8 02/12/2023   PLT 176 02/12/2023    Lab Results  Component Value Date   CREATININE 1.02 (H) 02/15/2023   BUN 11 02/15/2023   NA 140 02/15/2023   K 3.6 02/15/2023   CL 104 02/15/2023   CO2 21 (L) 02/15/2023    Lab Results  Component Value Date   ALT 9 02/15/2023   AST 13 (L) 02/15/2023   ALKPHOS 68  02/15/2023   BILITOT 0.8 02/15/2023       Laurice Record, [facility] for Infectious Disease [facility] 02/16/2023, 2:10 PM

## 2023-02-16 NOTE — Discharge Instructions (Addendum)
You were hospitalized for a wound of your right foot.  Hospital Course: You underwent surgery of your right foot by podiatry. We gave you antibiotics to decrease the risk of further infection of your right foot. After the results of your tissue came back, we will not need to send you home on antibiotics. We sent you home with oxycodone and tylenol to help control your pain after surgery. Because of your recent refill of oxycodone, we were unable to prescribe this medication from the hospital. For 1 day after you get out of the hospital you can take 7.5 mg (1.5 tablets) every 4 hours as needed for pain and then after this day we want you to return to your home dose of 5 mg every 4 hours as needed. We can discuss pain control in clinic as well. We discharged you with MiraLAX and senna to help with having bowel movements at home.  We discharged you with new test strips for your glucose meter. We are decreasing your insulin degludec to 20 units daily for now and will adjust as needed in clinic. Please start taking metformin again after discharge according to the medication instructions below.  Please use the Sarna lotion to help with your itching.  Unfortunately we do not have a loofah or shoe horn however you can get these items from [facility]. Please follow up with [place] in clinic, with wound care, and with podiatry as listed below.    Medications:  - Please start taking metformin 500 mg with the following schedule:  500 mg (1 tablet) daily with breakfast for 7 days (week 1)  Followed by:   500 mg (1 tablet) with breakfast and 500 mg (1 tablet) with dinner for 7 days (week 2 and after)   Physical Therapy/Occupational Therapy: We ordered home health physical therapy, occupational therapy to help you adapt to getting around your house after being hospitalized. We also ordered you a wheelchair and bedside commode to help at home.   Follow-up: - Please follow up with your primary care provider at  the [facility] on 02/23/2023 at 1:15 PM.  Phone: [phone] Address: [address], [place], [place], [place] [address]  - Please follow up with Dr. Fredirick Maudlin, MD at the [facility] on 02/19/2023 at 9:00 AM  Phone: [phone] Address: [address], [place], [place] [address]  - Please follow up with Dr. Jacqualyn Posey (podiatry) (please call to schedule an appointment) Phone: [phone] Address: [address], [place], [place] [address]

## 2023-02-17 ENCOUNTER — Telehealth: Payer: Self-pay

## 2023-02-17 ENCOUNTER — Other Ambulatory Visit: Payer: Self-pay

## 2023-02-17 ENCOUNTER — Other Ambulatory Visit (HOSPITAL_COMMUNITY): Payer: Self-pay

## 2023-02-17 DIAGNOSIS — E11622 Type 2 diabetes mellitus with other skin ulcer: Secondary | ICD-10-CM | POA: Diagnosis not present

## 2023-02-17 DIAGNOSIS — G8929 Other chronic pain: Secondary | ICD-10-CM

## 2023-02-17 LAB — ANAEROBIC CULTURE W GRAM STAIN: Gram Stain: NONE SEEN

## 2023-02-17 MED ORDER — OXYCODONE HCL 5 MG PO TABS: 5.0000 mg | ORAL_TABLET | ORAL | 0 refills | Status: DC | PRN

## 2023-02-17 NOTE — Transitions of Care (Post Inpatient/ED Visit) (Signed)
   02/17/2023  Name: JANESSA WUERTZ MRN: [id number] DOB: [birth year]  Today's TOC FU Call Status: Today's TOC FU Call Status:: Unsuccessul Call (1st Attempt) Unsuccessful Call (1st Attempt) Date: 02/17/23  Attempted to reach the patient regarding the most recent Inpatient/ED visit.  Follow Up Plan: Additional outreach attempts will be made to reach the patient to complete the Transitions of Care (Post Inpatient/ED visit) call.      Enzo Montgomery, RN,BSN,CCM [facility] Health/[facility] Care Management Care Management Community Coordinator Direct Phone: [phone] Toll Free: [phone] Fax: [phone]

## 2023-02-17 NOTE — Telephone Encounter (Signed)
Requesting refill on ondansetron [facility]) '4mg'$ .

## 2023-02-17 NOTE — Telephone Encounter (Signed)
RTC to patient is having some nausea whenever she tries to eat.  Has been on the Zofran before.  [facility] [address]

## 2023-02-18 ENCOUNTER — Other Ambulatory Visit: Payer: Self-pay | Admitting: Student

## 2023-02-18 ENCOUNTER — Telehealth: Payer: Self-pay

## 2023-02-18 ENCOUNTER — Other Ambulatory Visit: Payer: Self-pay | Admitting: Internal Medicine

## 2023-02-18 ENCOUNTER — Telehealth: Payer: Self-pay | Admitting: Dietician

## 2023-02-18 DIAGNOSIS — M62838 Other muscle spasm: Secondary | ICD-10-CM

## 2023-02-18 DIAGNOSIS — R112 Nausea with vomiting, unspecified: Secondary | ICD-10-CM

## 2023-02-18 DIAGNOSIS — K219 Gastro-esophageal reflux disease without esophagitis: Secondary | ICD-10-CM

## 2023-02-18 MED ORDER — ONDANSETRON 4 MG PO TBDP: 4.0000 mg | ORAL_TABLET | Freq: Three times a day (TID) | ORAL | 0 refills | Status: DC | PRN

## 2023-02-18 NOTE — Telephone Encounter (Signed)
Next appt scheduled 3/4 with Dr Nooruddin.

## 2023-02-18 NOTE — Transitions of Care (Post Inpatient/ED Visit) (Signed)
02/18/2023  Name: JENTRIE MISEK MRN: [id number] DOB: [birth year]  Today's TOC FU Call Status: Today's TOC FU Call Status:: Successful TOC FU Call Competed TOC FU Call Complete Date: 02/18/23  Transition Care Management Follow-up Telephone Call Date of Discharge: 02/16/23 Discharge Facility: [facility]) Type of Discharge: Inpatient Admission Primary Inpatient Discharge Diagnosis:: "diabetic foot infection s/p transmetatarsal amputation" How have you been since you were released from the hospital?: Same (Patient voices she was havign nause in the hospital and it has continued since she has returned home-she is out of nausea med-called MD office to request refill on med) Any questions or concerns?: Yes Patient Questions/Concerns:: Patient states she was given wrong diabetic sensors for glucose montioring from pharmacy. She has already spoken with someone at MD office regarding this and they have spoken with pharmacy to get her the right sensors. She just has to go pick them up. Patient Questions/Concerns Addressed: Other:  Items Reviewed: Did you receive and understand the discharge instructions provided?: Yes Medications obtained and verified?: No (pt declined med review-did not feel up to it) Any new allergies since your discharge?: No Dietary orders reviewed?: Yes Type of Diet Ordered:: carb modified/heart healthy Do you have support at home?: Yes People in Home: child(ren), adult Name of Support/Comfort Primary Source: [place] and Equipment/Supplies: [place] Ordered?: Yes Name of [place]:: [place] set up a time to come to your home?: Yes [place] Visit Date: 02/21/23 Any new equipment or medical supplies ordered?: Yes (sliding board, wheelchair, 3N1) Name of Medical supply agency?: [employer] Were you able to get the equipment/medical supplies?: Yes Do you have any questions related to the use of the  equipment/supplies?: No  Functional Questionnaire: Do you need assistance with bathing/showering or dressing?: Yes Do you need assistance with meal preparation?: Yes Do you need assistance with eating?: No Do you have difficulty maintaining continence: No Do you need assistance with getting out of bed/getting out of a chair/moving?: Yes Do you have difficulty managing or taking your medications?: No  Folllow up appointments reviewed: PCP Follow-up appointment confirmed?: Yes Date of PCP follow-up appointment?: 02/23/23 Follow-up Provider: Dr. Sanjuana Mae Specialist [facility] Follow-up appointment confirmed?: Yes Date of Specialist follow-up appointment?: 02/19/23 Follow-Up Specialty Provider:: Dr. Celine Ahr, 02/20/23-Dr. [place] Bali Do you need transportation to your follow-up appointment?: No Do you understand care options if your condition(s) worsen?: Yes-patient verbalized understanding  SDOH Interventions Today    Flowsheet Row Most Recent Value  SDOH Interventions   Food Insecurity Interventions Intervention Not Indicated  Transportation Interventions Intervention Not Indicated       TOC Interventions Today    Flowsheet Row Most Recent Value  TOC Interventions   TOC Interventions Discussed/Reviewed TOC Interventions Discussed, Post discharge activity limitations per provider, S/S of infection, Post op wound/incision care       Interventions Today    Flowsheet Row Most Recent Value  General Interventions   General Interventions Discussed/Reviewed Referral to Nurse  [appt scheduled for 02/25/23]  Education Interventions   Education Provided Provided Education  [pain mgmt, bowel regimen, nausea mgmt]  Provided Verbal Education On Nutrition, When to see the doctor, Other, Sick Day Rules, Blood Sugar Monitoring, Medication  Nutrition Interventions   Nutrition Discussed/Reviewed Nutrition Discussed  Pharmacy Interventions   Pharmacy Dicussed/Reviewed Pharmacy Topics Discussed,  Medications and their functions  Safety Interventions   Safety Discussed/Reviewed Safety Discussed, Fall Risk        Enzo Montgomery, RN,BSN,CCM  [facility] Health/[facility] Care Management Care Management Community Coordinator Direct Phone: [phone] Toll Free: [phone] Fax: [phone]

## 2023-02-18 NOTE — Telephone Encounter (Signed)
Delayla called for help with her CGM because she got the reader [facility] and [facility] sensors for FL2. She wants to try to use the FL2 with the app on her phone. She asked for help with that on Monday 02-23-23 at 1:15 PM. [facility] to [facility] for FL[facility] and they did not have an order for those and said her FL3 sensors went through for zero copay. Discussed with [name].

## 2023-02-19 ENCOUNTER — Telehealth: Payer: Self-pay | Admitting: Podiatry

## 2023-02-19 ENCOUNTER — Encounter (HOSPITAL_BASED_OUTPATIENT_CLINIC_OR_DEPARTMENT_OTHER): Payer: Medicaid Other | Admitting: General Surgery

## 2023-02-19 NOTE — Telephone Encounter (Signed)
Can you please schedule her a follow up with me next week for post-op surgery/hospital discharge? Thanks!

## 2023-02-20 ENCOUNTER — Ambulatory Visit (INDEPENDENT_AMBULATORY_CARE_PROVIDER_SITE_OTHER): Payer: 59 | Admitting: Podiatry

## 2023-02-20 ENCOUNTER — Ambulatory Visit: Payer: 59 | Admitting: Infectious Diseases

## 2023-02-20 ENCOUNTER — Ambulatory Visit (INDEPENDENT_AMBULATORY_CARE_PROVIDER_SITE_OTHER): Payer: 59

## 2023-02-20 ENCOUNTER — Other Ambulatory Visit: Payer: Self-pay

## 2023-02-20 DIAGNOSIS — M86071 Acute hematogenous osteomyelitis, right ankle and foot: Secondary | ICD-10-CM

## 2023-02-20 DIAGNOSIS — Z89431 Acquired absence of right foot: Secondary | ICD-10-CM | POA: Insufficient documentation

## 2023-02-20 DIAGNOSIS — Z89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): Secondary | ICD-10-CM | POA: Diagnosis not present

## 2023-02-20 MED ORDER — BACLOFEN 10 MG PO TABS: 5.0000 mg | ORAL_TABLET | Freq: Two times a day (BID) | ORAL | 0 refills | Status: DC

## 2023-02-20 MED ORDER — PANTOPRAZOLE SODIUM 40 MG PO TBEC: 40.0000 mg | DELAYED_RELEASE_TABLET | Freq: Every day | ORAL | 1 refills | Status: DC

## 2023-02-20 MED ORDER — CLINDAMYCIN HCL 300 MG PO CAPS: 300.0000 mg | ORAL_CAPSULE | Freq: Three times a day (TID) | ORAL | 0 refills | Status: DC

## 2023-02-20 NOTE — Progress Notes (Signed)
Subjective: Chief Complaint  Patient presents with   Routine Post Op    POV #1 right transmet amp DOS 2.22.24    Jennifer [name] is a 62 y.o. is seen today in office s/p right transmetatarsal amputation preformed on 02/12/2023.  She states that she is feeling well.  No significant pain.  Denies any fevers or chills.  No other concerns.  Objective: General: No acute distress, AAOx3  DP/PT pulses palpable , CRT < 3 sec to all digits.  Right foot: Incision is well coapted without any evidence of dehiscence with sutures, staples intact.  There is no drainage or pus.  There is some localized erythema to the dorsal flap.  There is edema present but there is no fluctuation or crepitation.  There is no malodor.  No pain on exam.  No other areas of tenderness to bilateral lower extremities.  No other open lesions or pre-ulcerative lesions.  No pain with calf compression, swelling, warmth, erythema.   Assessment and Plan:  Status post right foot transmetatarsal amputation, localized erythema  -Treatment options discussed including all alternatives, risks, and complications -X-rays were obtained reviewed.  3 views of the foot were obtained.  Status post transmetatarsal amputation -Incision appears to be healing well with there are some localized erythema.  Will start clindamycin. -Continue nonweightbearing -Elevation -Pain medication as needed. -Monitor for any clinical signs or symptoms of infection and DVT/PE and directed to call the office immediately should any occur or go to the ER. -Follow-up as scheduled or sooner if any problems arise. In the meantime, encouraged to call the office with any questions, concerns, change in symptoms.   Celesta Gentile, DPM

## 2023-02-21 DIAGNOSIS — G8929 Other chronic pain: Secondary | ICD-10-CM | POA: Diagnosis not present

## 2023-02-21 DIAGNOSIS — Z4781 Encounter for orthopedic aftercare following surgical amputation: Secondary | ICD-10-CM | POA: Diagnosis not present

## 2023-02-21 DIAGNOSIS — Z9181 History of falling: Secondary | ICD-10-CM | POA: Diagnosis not present

## 2023-02-21 DIAGNOSIS — E1151 Type 2 diabetes mellitus with diabetic peripheral angiopathy without gangrene: Secondary | ICD-10-CM | POA: Diagnosis not present

## 2023-02-21 DIAGNOSIS — E785 Hyperlipidemia, unspecified: Secondary | ICD-10-CM | POA: Diagnosis not present

## 2023-02-21 DIAGNOSIS — Z89431 Acquired absence of right foot: Secondary | ICD-10-CM | POA: Diagnosis not present

## 2023-02-21 DIAGNOSIS — E1159 Type 2 diabetes mellitus with other circulatory complications: Secondary | ICD-10-CM | POA: Diagnosis not present

## 2023-02-21 DIAGNOSIS — Z7984 Long term (current) use of oral hypoglycemic drugs: Secondary | ICD-10-CM | POA: Diagnosis not present

## 2023-02-21 DIAGNOSIS — M5416 Radiculopathy, lumbar region: Secondary | ICD-10-CM | POA: Diagnosis not present

## 2023-02-21 DIAGNOSIS — Z794 Long term (current) use of insulin: Secondary | ICD-10-CM | POA: Diagnosis not present

## 2023-02-21 DIAGNOSIS — N182 Chronic kidney disease, stage 2 (mild): Secondary | ICD-10-CM | POA: Diagnosis not present

## 2023-02-21 DIAGNOSIS — E1122 Type 2 diabetes mellitus with diabetic chronic kidney disease: Secondary | ICD-10-CM | POA: Diagnosis not present

## 2023-02-21 DIAGNOSIS — I5030 Unspecified diastolic (congestive) heart failure: Secondary | ICD-10-CM | POA: Diagnosis not present

## 2023-02-21 DIAGNOSIS — Z7985 Long-term (current) use of injectable non-insulin antidiabetic drugs: Secondary | ICD-10-CM | POA: Diagnosis not present

## 2023-02-21 DIAGNOSIS — E1142 Type 2 diabetes mellitus with diabetic polyneuropathy: Secondary | ICD-10-CM | POA: Diagnosis not present

## 2023-02-21 DIAGNOSIS — Z89612 Acquired absence of left leg above knee: Secondary | ICD-10-CM | POA: Diagnosis not present

## 2023-02-21 DIAGNOSIS — E1169 Type 2 diabetes mellitus with other specified complication: Secondary | ICD-10-CM | POA: Diagnosis not present

## 2023-02-21 DIAGNOSIS — Z7982 Long term (current) use of aspirin: Secondary | ICD-10-CM | POA: Diagnosis not present

## 2023-02-21 DIAGNOSIS — I152 Hypertension secondary to endocrine disorders: Secondary | ICD-10-CM | POA: Diagnosis not present

## 2023-02-23 ENCOUNTER — Ambulatory Visit (INDEPENDENT_AMBULATORY_CARE_PROVIDER_SITE_OTHER): Payer: 59 | Admitting: Student

## 2023-02-23 ENCOUNTER — Telehealth: Payer: Self-pay | Admitting: *Deleted

## 2023-02-23 ENCOUNTER — Other Ambulatory Visit (HOSPITAL_COMMUNITY): Payer: Self-pay

## 2023-02-23 VITALS — BP 148/63 | HR 75 | Temp 98.3°F | Ht 65.0 in | Wt 220.6 lb

## 2023-02-23 DIAGNOSIS — I152 Hypertension secondary to endocrine disorders: Secondary | ICD-10-CM | POA: Diagnosis not present

## 2023-02-23 DIAGNOSIS — Z794 Long term (current) use of insulin: Secondary | ICD-10-CM | POA: Diagnosis not present

## 2023-02-23 DIAGNOSIS — M869 Osteomyelitis, unspecified: Secondary | ICD-10-CM

## 2023-02-23 DIAGNOSIS — E1159 Type 2 diabetes mellitus with other circulatory complications: Secondary | ICD-10-CM

## 2023-02-23 DIAGNOSIS — E1169 Type 2 diabetes mellitus with other specified complication: Secondary | ICD-10-CM | POA: Diagnosis not present

## 2023-02-23 DIAGNOSIS — F17210 Nicotine dependence, cigarettes, uncomplicated: Secondary | ICD-10-CM

## 2023-02-23 DIAGNOSIS — M86671 Other chronic osteomyelitis, right ankle and foot: Secondary | ICD-10-CM

## 2023-02-23 DIAGNOSIS — Z720 Tobacco use: Secondary | ICD-10-CM

## 2023-02-23 MED ORDER — NICOTINE 14 MG/24HR TD PT24
14.0000 mg | MEDICATED_PATCH | TRANSDERMAL | 0 refills | Status: DC
  Filled 2023-02-23: qty 28, 28d supply, fill #0

## 2023-02-23 NOTE — Patient Instructions (Addendum)
Thank you so much for coming to the clinic today!   I'm glad you're doing well! I am sending in some nicotine patches, hopefully they will help. Next time you come in please bring in your glucometer so we can see the numbers!  If you have any questions please feel free to the call the clinic at anytime at [phone]. It was a pleasure seeing you!  Best, Dr. Sanjuana Mae

## 2023-02-23 NOTE — Telephone Encounter (Addendum)
[facility] nurse [phone] calling to ask for nursing home health for patient , 1 time a week for 5 weeks, will do disease medication teaching for patient as well, please advise.

## 2023-02-23 NOTE — Telephone Encounter (Signed)
Called Arlyn Dunning , updating information per physician thru voice message.

## 2023-02-24 ENCOUNTER — Telehealth: Payer: Self-pay | Admitting: *Deleted

## 2023-02-24 DIAGNOSIS — E785 Hyperlipidemia, unspecified: Secondary | ICD-10-CM | POA: Diagnosis not present

## 2023-02-24 DIAGNOSIS — M5416 Radiculopathy, lumbar region: Secondary | ICD-10-CM | POA: Diagnosis not present

## 2023-02-24 DIAGNOSIS — Z794 Long term (current) use of insulin: Secondary | ICD-10-CM | POA: Diagnosis not present

## 2023-02-24 DIAGNOSIS — Z7985 Long-term (current) use of injectable non-insulin antidiabetic drugs: Secondary | ICD-10-CM | POA: Diagnosis not present

## 2023-02-24 DIAGNOSIS — I5030 Unspecified diastolic (congestive) heart failure: Secondary | ICD-10-CM | POA: Diagnosis not present

## 2023-02-24 DIAGNOSIS — E1122 Type 2 diabetes mellitus with diabetic chronic kidney disease: Secondary | ICD-10-CM | POA: Diagnosis not present

## 2023-02-24 DIAGNOSIS — Z89431 Acquired absence of right foot: Secondary | ICD-10-CM | POA: Diagnosis not present

## 2023-02-24 DIAGNOSIS — Z7982 Long term (current) use of aspirin: Secondary | ICD-10-CM | POA: Diagnosis not present

## 2023-02-24 DIAGNOSIS — I152 Hypertension secondary to endocrine disorders: Secondary | ICD-10-CM | POA: Diagnosis not present

## 2023-02-24 DIAGNOSIS — Z89612 Acquired absence of left leg above knee: Secondary | ICD-10-CM | POA: Diagnosis not present

## 2023-02-24 DIAGNOSIS — Z4781 Encounter for orthopedic aftercare following surgical amputation: Secondary | ICD-10-CM | POA: Diagnosis not present

## 2023-02-24 DIAGNOSIS — N182 Chronic kidney disease, stage 2 (mild): Secondary | ICD-10-CM | POA: Diagnosis not present

## 2023-02-24 DIAGNOSIS — G8929 Other chronic pain: Secondary | ICD-10-CM | POA: Diagnosis not present

## 2023-02-24 DIAGNOSIS — Z7984 Long term (current) use of oral hypoglycemic drugs: Secondary | ICD-10-CM | POA: Diagnosis not present

## 2023-02-24 DIAGNOSIS — Z9181 History of falling: Secondary | ICD-10-CM | POA: Diagnosis not present

## 2023-02-24 DIAGNOSIS — E1142 Type 2 diabetes mellitus with diabetic polyneuropathy: Secondary | ICD-10-CM | POA: Diagnosis not present

## 2023-02-24 DIAGNOSIS — E1169 Type 2 diabetes mellitus with other specified complication: Secondary | ICD-10-CM | POA: Diagnosis not present

## 2023-02-24 DIAGNOSIS — E1151 Type 2 diabetes mellitus with diabetic peripheral angiopathy without gangrene: Secondary | ICD-10-CM | POA: Diagnosis not present

## 2023-02-24 DIAGNOSIS — E1159 Type 2 diabetes mellitus with other circulatory complications: Secondary | ICD-10-CM | POA: Diagnosis not present

## 2023-02-24 NOTE — Progress Notes (Signed)
CC: Hospital follow-up  HPI:  [name] is a 62 y.o. female living with a history stated below and presents today for hospital follow-up. Please see problem based assessment and plan for additional details.  Past Medical History:  Diagnosis Date   Acute vestibular syndrome, resolved 03/02/2021   Angioedema 06/14/2021   Asthma    Burn of finger of right hand, second degree 02/05/2021   Occurred during cooking (frying), poor sensation due to neuropathy, pt punctured blister to allow it to drain, skin has since desquamated over dorsal joint, no infection.  Keep clean and dry, OTC antibacterial ointment.   Cataract    CHF (congestive heart failure) (HCC)    Chronic bronchitis ([place])    "I get it alot" (09/28/2013)   Chronic diastolic heart failure (HCC)    grade 2 per 2D echocardiogram (01/2013)   Chronic kidney disease    Chronic lower back pain    Chronic pain syndrome 12/03/2011   Likely secondary to depression, "fibromyalgia", neuropathy, and obesity. Lumbar MRI 2014 no sig change from prior (2008) : Stable hypertrophic facet disease most notable at L4-5. Stable shallow left foraminal/extraforaminal disc protrusion at L4-5. No direct neural compression.       Chronic right shoulder pain 10/10/2021   COPD 01/08/2007   PFT's 05/2007 : FEV1/FVC 82, FEV1 64% pred, FEF 25-75% 40% predicted, 16% improvement in FEV1 with bronchodilators.      Depression    Diabetic peripheral neuropathy (HCC)    Dizziness, resolved (admitted with vestibular migraine)    DVT of upper extremity (deep vein thrombosis) ([place]) 03/11/2013   Secondary to PICC line. Right brachial vein, diagnosed on 03/10/2013 Coumadin for 3 months. End date 06/10/2013    Environmental allergies    Hx: of   Fatty liver 2003   observed on ultrasound abdomen   Fibromyalgia    GERD (gastroesophageal reflux disease)    Glaucoma    H/O above knee amputation, left ([place]) 07/11/2021   Revision in 2020 to AKA from [place] 2014.    Headache    History of amputation of 4th and 5th toes right foot ([place]) 07/11/2021   Dr Amalia Hailey jan 2018 2/2 osteo   History of bacterial endocarditis 2014   Endocarditis involving mitral and tricuspid valves.  S. Aureus and GBS.    History of use of hearing aid    Hyperlipidemia    Hyperplastic colon polyp 12/2010   Per colonoscopy (12/2010) - Dr. 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       Initially evaluated 08/2019 with dedicated MRI: "IMPRESSION:  Skin marker overlies a 9 x 10 x 11 mm cyst within the anterior  aspect of the superficial lobe of the right parotid gland. This is  presumed to be a benign cyst. The possibility of a cystic Warthin's  tumor   Pneumonia    PVD (peripheral vascular disease) with claudication ([place])    Stents to bilateral common iliac arteries (left 2005, right 2008), on chronic plavix   Pyelonephritis 10/28/2020   S/P BKA (below knee amputation) unilateral ([place])    2014 L - failed limb preserving treatment. 2/2 tobacco use, DM, and cont weight bearing on surgical wound and  developed gangrene    Spinal stenosis    Tobacco abuse    Toe ulcer, right 4th ([place]) leading to osteomylitis 07/08/2021   Right fourth toe turned dark, alerting her to abnormality, "it split open and drained".  Evaluated on 07/08/2021 by podiatrist Dr. Amalia Hailey who debrided necrotic tissue and prescribed doxycycline.  He will see her again in 3 weeks.  The location of this ulcer on the dorsal aspect of the toe is somewhat atypical for a purely diabetic foot wound, and she does have a strong DP pulse.  I did not examine h   Type II diabetes mellitus with peripheral circulatory disorders, uncontrolled DX:  1993   Insulin dep. Poor control. Complicated by diabetic foot ulcer and diabetic eye disease.     Upper respiratory tract infection due to COVID-19 virus 07/22/2022   Urinary incontinence, severe, mixed (stress, urge, functional) 02/14/2023   Unresponsive to medications    Current Outpatient Medications on File Prior to Visit  Medication Sig Dispense Refill   acetaminophen (TYLENOL) 650 MG CR tablet Take 2 tablets (1,300 mg total) by mouth every 8 (eight) hours as needed for pain. 30 tablet 0   albuterol (PROAIR HFA) 108 (90 Base) MCG/ACT inhaler INHALE 2 PUFFS BY MOUTH EVERY 6 HOURS AS NEEDED FOR WHEEZING (Patient taking differently: Inhale 1 puff into the lungs every 6 (six) hours as needed for shortness of breath.) 54 g 3   amLODipine (NORVASC) 10 MG tablet Take 1 tablet (10 mg total) by mouth daily. 90 tablet 3   aspirin EC 81 MG tablet Take 1 tablet (81 mg total) by mouth daily. 30 tablet    baclofen (LIORESAL) 10 MG tablet Take 0.5 tablets (5 mg total) by mouth 2 (two) times daily. 60 each 0   camphor-menthol (SARNA) lotion Apply topically as needed for itching. 222 mL 0   clindamycin (CLEOCIN) 300 MG capsule Take 1 capsule (300 mg total) by mouth 3 (three) times daily. 30 capsule 0   Continuous Blood Gluc Receiver (FREESTYLE LIBRE 3 READER) DEVI 1 each by Does not apply route continuous. Use to continuously monitor blood glucose 1 each 1   Continuous Blood Gluc Sensor (FREESTYLE LIBRE 3 SENSOR) MISC 1 each by Does not apply route 6 (six) times daily. Place 1 sensor on the skin every 14 days. Use to check glucose continuously 2 each 2   Dextromethorphan-guaiFENesin ([employer] MUCUS DM EXTENDED RELEASE) 30-600 MG TB12 TAKE 1 TABLET BY MOUTH TWICE A DAY 40 tablet PRN   DULoxetine (CYMBALTA) 30 MG capsule Take 1 capsule (30 mg total) by mouth 2 (two) times daily. 60 capsule 3   glucose blood test strip Use 3 (three) times daily. 500 strip 0   insulin degludec (TRESIBA FLEXTOUCH) 200 UNIT/ML  FlexTouch Pen INJECT 20 UNITS UNDER THE SKIN DAILY 9 mL 0   Insulin Pen Needle 32G X 4 MM MISC Use to inject insulin 4 times a day and semaglutide once weekly 100 each 10   Lancets MISC Use as directed 100 each 5   metFORMIN (GLUCOPHAGE) 500 MG tablet Take 1 tablet (500 mg total) by mouth 2 (two) times daily. 60 tablet 0   metoprolol succinate (TOPROL-XL) 100 MG 24 hr tablet Take 1 tablet (100 mg total) by mouth daily. TAKE WITH OR IMMEDIATELY FOLLOWING A MEAL. (Patient taking differently: Take 100 mg by mouth daily.) 90 tablet 3   Multiple Vitamin (MULTIVITAMIN) tablet Take 1 tablet by mouth daily.     MYRBETRIQ 50 MG  TB24 tablet Take 1 tablet (50 mg total) by mouth daily. 90 tablet 3   nystatin powder Apply 1 application  topically 3 (three) times daily as needed. (Patient taking differently: Apply 1 application  topically 3 (three) times daily as needed (For dryness).) 30 g 5   ondansetron (ZOFRAN-ODT) 4 MG disintegrating tablet Take 1 tablet (4 mg total) by mouth every 8 (eight) hours as needed for nausea or vomiting. 20 tablet 0   OVER THE COUNTER MEDICATION Take 1 tablet by mouth daily. [employer] Health otc supplement     oxybutynin (DITROPAN-XL) 10 MG 24 hr tablet Take 10 mg by mouth daily.     oxyCODONE (OXY IR/ROXICODONE) 5 MG immediate release tablet Take 1 tablet (5 mg total) by mouth every 4 (four) hours as needed for severe pain. 120 tablet 0   pantoprazole (PROTONIX) 40 MG tablet Take 1 tablet (40 mg total) by mouth daily. 90 tablet 1   polyethylene glycol powder (GLYCOLAX/MIRALAX) 17 GM/SCOOP powder Take 17 g by mouth daily as needed for mild constipation. 238 g 0   povidone-iodine (BETADINE) 10 % external solution Apply 1 Application topically as needed for wound care. 480 mL 0   pregabalin (LYRICA) 200 MG capsule TAKE 1 CAPSULE BY MOUTH THREE TIMES A DAY (Patient taking differently: Take 200 mg by mouth daily.) 90 capsule 3   rosuvastatin (CRESTOR) 20 MG tablet Take 1 tablet (20 mg  total) by mouth in the morning. (Patient taking differently: Take 20 mg by mouth daily.) 90 tablet 3   Semaglutide, 1 MG/DOSE, (OZEMPIC, 1 MG/DOSE,) 4 MG/3ML SOPN Inject 1 mg into the skin once a week. 3 mL 12   senna-docusate (SENOKOT-S) 8.6-50 MG tablet Take 1 tablet by mouth at bedtime. 30 tablet 0   traZODone (DESYREL) 100 MG tablet Take 2 tablets (200 mg total) by mouth at bedtime as needed for sleep. 60 tablet 11   [DISCONTINUED] Potassium Chloride ER 20 MEQ TBCR Take 40 mEq by mouth daily. 30 tablet 0   No current facility-administered medications on file prior to visit.    Family History  Problem Relation Age of Onset   Diverticulosis Mother    Diabetes Mother    Hypertension Mother    Congestive Heart Failure Mother    Asthma Father    CAD Sister 33       MI at age 68 per patient.  However, she has not had a stent or CABG.    Heart disease Sister        before age 30   Breast cancer Neg Hx     Social History   Socioeconomic History   Marital status: Divorced    Spouse name: Not on file   Number of children: 2   Years of education: college   Highest education level: Not on file  Occupational History   Occupation: Disability    Comment: previously worked as a Training and development officer  Tobacco Use   Smoking status: Every Day    Packs/day: 0.50    Years: 50.00    Total pack years: 25.00    Types: Cigarettes    Start date: 12/22/1970   Smokeless tobacco: Never   Tobacco comments:    1/2 -1 PPD, Would like nicotene patches sent to help quit.   Vaping Use   Vaping Use: Never used  Substance and Sexual Activity   Alcohol use: No    Alcohol/week: 0.0 standard drinks of alcohol   Drug use: No    Types:  Marijuana, "Crack" cocaine    Comment: 09/28/2013 "no marijuana since 2011, no crack/cocaine 1989"   Sexual activity: Not Currently  Other Topics Concern   Not on file  Social History Narrative   On disability. Lives with son in [place]. Formerly worked as Training and development officer.    Boyfriend passed  away stage 4 cancer March 2014.   S/p L BKA 2014. In wheelchair in paritially suitable apartment.    Social Determinants of Health   Financial Resource Strain: Low Risk  (10/25/2021)   Overall Financial Resource Strain (CARDIA)    Difficulty of Paying Living Expenses: Not very hard  Food Insecurity: No Food Insecurity (02/18/2023)   Hunger Vital Sign    Worried About Running Out of Food in the Last Year: Never true    Ran Out of Food in the Last Year: Never true  Transportation Needs: No Transportation Needs (02/18/2023)   PRAPARE - Hydrologist (Medical): No    Lack of Transportation (Non-Medical): No  Physical Activity: Not on file  Stress: Not on file  Social Connections: Not on file  Intimate Partner Violence: Not on file    Review of Systems: ROS negative except for what is noted on the assessment and plan.  Vitals:   02/23/23 1318  BP: (!) 148/63  Pulse: 75  Temp: 98.3 F (36.8 C)  TempSrc: Oral  SpO2: 100%  Weight: 220 lb 9.6 oz (100.1 kg)  Height: '5\' 5"'$  (1.651 m)    Physical Exam: Constitutional: well-appearing female sitting in wheelchair, in no acute distress HENT: normocephalic atraumatic, mucous membranes moist Eyes: conjunctiva non-erythematous Neck: supple Cardiovascular: regular rate and rhythm, no m/r/g Pulmonary/Chest: normal work of breathing on room air, lungs clear to auscultation bilaterally Abdominal: soft, non-tender, non-distended MSK: normal bulk and tone, L BKA and prosthesis present, right transmetatarsal amputation with no signs of erythema or infection  Assessment & Plan:   Osteomyelitis of great toe of right foot ([place]) treated with transmet amputation Patient presents as follow-up from hospital after being found to have osteomyelitis of great toe of right foot that was treated with a transmetatarsal amputation.  She was hospitalized from February 20 to February 16, 2023.  Since then, she is doing well physically.   She follows with podiatry as well and they state that her wound is looking good and healing correctly.  On my examination I concur.  She has home health PT and OT set up, and has been working with them slowly and surely.    Diabetes mellitus type 2, controlled, with complications ([place]) Last A1c was done in January 2024 which was 8.3%.  Her current regimen is insulin long-acting Tresiba 67 units, Ozempic 1 mg, and metformin 500 mg twice a day.  She does have a glucometer however does not know how to use it.  Likely, she had an appointment with diabetes coordinator who assured her the exact way to use it and patient stated that she will bring in her glucometer at her next visit for further evaluation and potential titration of her medications if necessary.  Hypertension associated with diabetes ([place]) Patient blood pressure in the clinic 148/73.  Her current regimen is metoprolol 100 mg daily as well as amlodipine 10 mg daily.  She attributes her high blood pressure to stress ever since she got the amputation as this may be taking a toll on her mental health.  She stated that she will check her blood pressure at home and bring in a  log at next visit for further evaluation.  Tobacco abuse Patient is open to cessation of nicotine.  She has tried Chantix in the past however was not able to tolerate it well.  Will send in 14 mg nicotine patches for this patient, and reevaluate process at next visit.  Patient discussed with Dr. Thomasene Ripple, M.D. [facility], PGY-1 Phone: [phone] Date 02/24/2023 Time 11:48 AM

## 2023-02-24 NOTE — Assessment & Plan Note (Signed)
Patient presents as follow-up from hospital after being found to have osteomyelitis of great toe of right foot that was treated with a transmetatarsal amputation.  She was hospitalized from February 20 to February 16, 2023.  Since then, she is doing well physically.  She follows with podiatry as well and they state that her wound is looking good and healing correctly.  On my examination I concur.  She has home health PT and OT set up, and has been working with them slowly and surely.

## 2023-02-24 NOTE — Telephone Encounter (Signed)
PT w/ [facility]([place])[phone] is calling to clarify if patient is needing more rest, told him that the physician wanted her not put pressure on that foot for 2 weeks, if this is true he can let her rest and come back in 2-3 weeks, please advise.

## 2023-02-24 NOTE — Assessment & Plan Note (Signed)
Last A1c was done in January 2024 which was 8.3%.  Her current regimen is insulin long-acting Tresiba 67 units, Ozempic 1 mg, and metformin 500 mg twice a day.  She does have a glucometer however does not know how to use it.  Likely, she had an appointment with diabetes coordinator who assured her the exact way to use it and patient stated that she will bring in her glucometer at her next visit for further evaluation and potential titration of her medications if necessary.

## 2023-02-24 NOTE — Assessment & Plan Note (Signed)
Patient is open to cessation of nicotine.  She has tried Chantix in the past however was not able to tolerate it well.  Will send in 14 mg nicotine patches for this patient, and reevaluate process at next visit.

## 2023-02-24 NOTE — Telephone Encounter (Signed)
Called PT giving instructions per physician, verbalized understanding and ask if the physician can send a prescription with patient after her next visit w/ further instructions for him as well.

## 2023-02-24 NOTE — Assessment & Plan Note (Signed)
Patient blood pressure in the clinic 148/73.  Her current regimen is metoprolol 100 mg daily as well as amlodipine 10 mg daily.  She attributes her high blood pressure to stress ever since she got the amputation as this may be taking a toll on her mental health.  She stated that she will check her blood pressure at home and bring in a log at next visit for further evaluation.

## 2023-02-25 ENCOUNTER — Telehealth: Payer: Self-pay

## 2023-02-25 DIAGNOSIS — T8189XA Other complications of procedures, not elsewhere classified, initial encounter: Secondary | ICD-10-CM | POA: Diagnosis not present

## 2023-02-25 NOTE — Progress Notes (Signed)
Internal Medicine Clinic Attending  Case discussed with the resident at the time of the visit.  We reviewed the resident's history and exam and pertinent patient test results.  I agree with the assessment, diagnosis, and plan of care documented in the resident's note.  

## 2023-02-25 NOTE — Patient Outreach (Signed)
  Care Coordination   02/25/2023 Name: Jennifer [name] MRN: [id number] DOB: 06-30-1961   Care Coordination Outreach Attempts:  An unsuccessful telephone outreach was attempted today to offer the patient information about available care coordination services as a benefit of their health plan.   Follow Up Plan:  Additional outreach attempts will be made to offer the patient care coordination information and services.   Encounter Outcome:  No Answer   Care Coordination Interventions:  No, not indicated    Lazaro Arms RN, BSN, [facility]   Phone: [phone]

## 2023-02-26 ENCOUNTER — Ambulatory Visit (INDEPENDENT_AMBULATORY_CARE_PROVIDER_SITE_OTHER): Payer: 59 | Admitting: Podiatry

## 2023-02-26 ENCOUNTER — Other Ambulatory Visit (HOSPITAL_COMMUNITY): Payer: Self-pay

## 2023-02-26 DIAGNOSIS — E1142 Type 2 diabetes mellitus with diabetic polyneuropathy: Secondary | ICD-10-CM | POA: Diagnosis not present

## 2023-02-26 DIAGNOSIS — E785 Hyperlipidemia, unspecified: Secondary | ICD-10-CM | POA: Diagnosis not present

## 2023-02-26 DIAGNOSIS — E1169 Type 2 diabetes mellitus with other specified complication: Secondary | ICD-10-CM | POA: Diagnosis not present

## 2023-02-26 DIAGNOSIS — Z89612 Acquired absence of left leg above knee: Secondary | ICD-10-CM | POA: Diagnosis not present

## 2023-02-26 DIAGNOSIS — N182 Chronic kidney disease, stage 2 (mild): Secondary | ICD-10-CM | POA: Diagnosis not present

## 2023-02-26 DIAGNOSIS — Z7985 Long-term (current) use of injectable non-insulin antidiabetic drugs: Secondary | ICD-10-CM | POA: Diagnosis not present

## 2023-02-26 DIAGNOSIS — E1151 Type 2 diabetes mellitus with diabetic peripheral angiopathy without gangrene: Secondary | ICD-10-CM | POA: Diagnosis not present

## 2023-02-26 DIAGNOSIS — G8929 Other chronic pain: Secondary | ICD-10-CM | POA: Diagnosis not present

## 2023-02-26 DIAGNOSIS — M86071 Acute hematogenous osteomyelitis, right ankle and foot: Secondary | ICD-10-CM

## 2023-02-26 DIAGNOSIS — Z4781 Encounter for orthopedic aftercare following surgical amputation: Secondary | ICD-10-CM | POA: Diagnosis not present

## 2023-02-26 DIAGNOSIS — M5416 Radiculopathy, lumbar region: Secondary | ICD-10-CM | POA: Diagnosis not present

## 2023-02-26 DIAGNOSIS — Z9181 History of falling: Secondary | ICD-10-CM | POA: Diagnosis not present

## 2023-02-26 DIAGNOSIS — Z794 Long term (current) use of insulin: Secondary | ICD-10-CM | POA: Diagnosis not present

## 2023-02-26 DIAGNOSIS — I5030 Unspecified diastolic (congestive) heart failure: Secondary | ICD-10-CM | POA: Diagnosis not present

## 2023-02-26 DIAGNOSIS — Z7984 Long term (current) use of oral hypoglycemic drugs: Secondary | ICD-10-CM | POA: Diagnosis not present

## 2023-02-26 DIAGNOSIS — Z89431 Acquired absence of right foot: Secondary | ICD-10-CM | POA: Diagnosis not present

## 2023-02-26 DIAGNOSIS — Z7982 Long term (current) use of aspirin: Secondary | ICD-10-CM | POA: Diagnosis not present

## 2023-02-26 DIAGNOSIS — I152 Hypertension secondary to endocrine disorders: Secondary | ICD-10-CM | POA: Diagnosis not present

## 2023-02-26 DIAGNOSIS — E1122 Type 2 diabetes mellitus with diabetic chronic kidney disease: Secondary | ICD-10-CM | POA: Diagnosis not present

## 2023-02-26 DIAGNOSIS — E1159 Type 2 diabetes mellitus with other circulatory complications: Secondary | ICD-10-CM | POA: Diagnosis not present

## 2023-02-27 DIAGNOSIS — M5416 Radiculopathy, lumbar region: Secondary | ICD-10-CM | POA: Diagnosis not present

## 2023-02-27 DIAGNOSIS — Z9181 History of falling: Secondary | ICD-10-CM | POA: Diagnosis not present

## 2023-02-27 DIAGNOSIS — Z7984 Long term (current) use of oral hypoglycemic drugs: Secondary | ICD-10-CM | POA: Diagnosis not present

## 2023-02-27 DIAGNOSIS — I152 Hypertension secondary to endocrine disorders: Secondary | ICD-10-CM | POA: Diagnosis not present

## 2023-02-27 DIAGNOSIS — Z7982 Long term (current) use of aspirin: Secondary | ICD-10-CM | POA: Diagnosis not present

## 2023-02-27 DIAGNOSIS — E1122 Type 2 diabetes mellitus with diabetic chronic kidney disease: Secondary | ICD-10-CM | POA: Diagnosis not present

## 2023-02-27 DIAGNOSIS — E1151 Type 2 diabetes mellitus with diabetic peripheral angiopathy without gangrene: Secondary | ICD-10-CM | POA: Diagnosis not present

## 2023-02-27 DIAGNOSIS — G8929 Other chronic pain: Secondary | ICD-10-CM | POA: Diagnosis not present

## 2023-02-27 DIAGNOSIS — Z7985 Long-term (current) use of injectable non-insulin antidiabetic drugs: Secondary | ICD-10-CM | POA: Diagnosis not present

## 2023-02-27 DIAGNOSIS — E1159 Type 2 diabetes mellitus with other circulatory complications: Secondary | ICD-10-CM | POA: Diagnosis not present

## 2023-02-27 DIAGNOSIS — E1142 Type 2 diabetes mellitus with diabetic polyneuropathy: Secondary | ICD-10-CM | POA: Diagnosis not present

## 2023-02-27 DIAGNOSIS — E785 Hyperlipidemia, unspecified: Secondary | ICD-10-CM | POA: Diagnosis not present

## 2023-02-27 DIAGNOSIS — Z794 Long term (current) use of insulin: Secondary | ICD-10-CM | POA: Diagnosis not present

## 2023-02-27 DIAGNOSIS — Z89431 Acquired absence of right foot: Secondary | ICD-10-CM | POA: Diagnosis not present

## 2023-02-27 DIAGNOSIS — I5030 Unspecified diastolic (congestive) heart failure: Secondary | ICD-10-CM | POA: Diagnosis not present

## 2023-02-27 DIAGNOSIS — N182 Chronic kidney disease, stage 2 (mild): Secondary | ICD-10-CM | POA: Diagnosis not present

## 2023-02-27 DIAGNOSIS — Z89612 Acquired absence of left leg above knee: Secondary | ICD-10-CM | POA: Diagnosis not present

## 2023-02-27 DIAGNOSIS — E1169 Type 2 diabetes mellitus with other specified complication: Secondary | ICD-10-CM | POA: Diagnosis not present

## 2023-02-27 DIAGNOSIS — Z4781 Encounter for orthopedic aftercare following surgical amputation: Secondary | ICD-10-CM | POA: Diagnosis not present

## 2023-03-01 NOTE — Progress Notes (Signed)
Subjective: Chief Complaint  Patient presents with   Hospitalization Follow-up    Patient stated she is doing ok, DOS 02/12/23, right foot amputation     Jennifer [name] is a 61 y.o. is seen today in office s/p right transmetatarsal amputation preformed on 02/12/2023.  She states that she did notice some bleeding pointing to the lateral aspect of the incision.  She has been taking antibiotics.  She denies any fevers or chills.  She has no concerns today.    Objective: General: No acute distress, AAOx3 -son is present. DP/PT pulses palpable , CRT < 3 sec to all digits.  Right foot: Incision is well coapted without any evidence of dehiscence with sutures, staples intact.  Unable to appreciate any active bleeding.  The swelling is actually improved compared to what it was last appointment as well and the erythema show dorsally is also much improved.  There is no drainage or pus today.  There is no fluctuation or crepitation.  There is no malodor.  No significant pain on exam. No pain with calf compression, swelling, warmth, erythema.   Assessment and Plan:  Status post right foot transmetatarsal amputation, localized erythema  -Treatment options discussed including all alternatives, risks, and complications -I removed the sutures left the staples intact.  Antibiotic ointment dressing applied followed by dressing.  She will keep the dressing clean, dry, intact -Initial course of antibiotics -Continue nonweightbearing -Elevation -Pain medication as needed. -Monitor for any clinical signs or symptoms of infection and DVT/PE and directed to call the office immediately should any occur or go to the ER. -Follow-up as scheduled for possible staple removal or sooner if any problems arise. In the meantime, encouraged to call the office with any questions, concerns, change in symptoms.   Celesta Gentile, DPM

## 2023-03-02 ENCOUNTER — Telehealth: Payer: Self-pay | Admitting: *Deleted

## 2023-03-02 NOTE — Telephone Encounter (Signed)
Jennifer [name] with [name] called - patient states she is having a hot, stabbing sensation which is a 9/10 for pain, pain with medication is still a 7. Would like to know what she needs to do. His callback number is [phone]

## 2023-03-04 ENCOUNTER — Other Ambulatory Visit: Payer: Self-pay

## 2023-03-04 DIAGNOSIS — Z7985 Long-term (current) use of injectable non-insulin antidiabetic drugs: Secondary | ICD-10-CM | POA: Diagnosis not present

## 2023-03-04 DIAGNOSIS — I5030 Unspecified diastolic (congestive) heart failure: Secondary | ICD-10-CM | POA: Diagnosis not present

## 2023-03-04 DIAGNOSIS — E1142 Type 2 diabetes mellitus with diabetic polyneuropathy: Secondary | ICD-10-CM | POA: Diagnosis not present

## 2023-03-04 DIAGNOSIS — E1169 Type 2 diabetes mellitus with other specified complication: Secondary | ICD-10-CM | POA: Diagnosis not present

## 2023-03-04 DIAGNOSIS — Z7984 Long term (current) use of oral hypoglycemic drugs: Secondary | ICD-10-CM | POA: Diagnosis not present

## 2023-03-04 DIAGNOSIS — Z89612 Acquired absence of left leg above knee: Secondary | ICD-10-CM | POA: Diagnosis not present

## 2023-03-04 DIAGNOSIS — E1122 Type 2 diabetes mellitus with diabetic chronic kidney disease: Secondary | ICD-10-CM | POA: Diagnosis not present

## 2023-03-04 DIAGNOSIS — R112 Nausea with vomiting, unspecified: Secondary | ICD-10-CM

## 2023-03-04 DIAGNOSIS — E785 Hyperlipidemia, unspecified: Secondary | ICD-10-CM | POA: Diagnosis not present

## 2023-03-04 DIAGNOSIS — G8929 Other chronic pain: Secondary | ICD-10-CM | POA: Diagnosis not present

## 2023-03-04 DIAGNOSIS — E1151 Type 2 diabetes mellitus with diabetic peripheral angiopathy without gangrene: Secondary | ICD-10-CM | POA: Diagnosis not present

## 2023-03-04 DIAGNOSIS — I152 Hypertension secondary to endocrine disorders: Secondary | ICD-10-CM | POA: Diagnosis not present

## 2023-03-04 DIAGNOSIS — M5416 Radiculopathy, lumbar region: Secondary | ICD-10-CM | POA: Diagnosis not present

## 2023-03-04 DIAGNOSIS — Z794 Long term (current) use of insulin: Secondary | ICD-10-CM | POA: Diagnosis not present

## 2023-03-04 DIAGNOSIS — E1159 Type 2 diabetes mellitus with other circulatory complications: Secondary | ICD-10-CM | POA: Diagnosis not present

## 2023-03-04 DIAGNOSIS — Z4781 Encounter for orthopedic aftercare following surgical amputation: Secondary | ICD-10-CM | POA: Diagnosis not present

## 2023-03-04 DIAGNOSIS — Z9181 History of falling: Secondary | ICD-10-CM | POA: Diagnosis not present

## 2023-03-04 DIAGNOSIS — N182 Chronic kidney disease, stage 2 (mild): Secondary | ICD-10-CM | POA: Diagnosis not present

## 2023-03-04 DIAGNOSIS — Z7982 Long term (current) use of aspirin: Secondary | ICD-10-CM | POA: Diagnosis not present

## 2023-03-04 DIAGNOSIS — Z89431 Acquired absence of right foot: Secondary | ICD-10-CM | POA: Diagnosis not present

## 2023-03-06 ENCOUNTER — Telehealth: Payer: Self-pay | Admitting: *Deleted

## 2023-03-06 DIAGNOSIS — Z7984 Long term (current) use of oral hypoglycemic drugs: Secondary | ICD-10-CM | POA: Diagnosis not present

## 2023-03-06 DIAGNOSIS — E1142 Type 2 diabetes mellitus with diabetic polyneuropathy: Secondary | ICD-10-CM | POA: Diagnosis not present

## 2023-03-06 DIAGNOSIS — Z89431 Acquired absence of right foot: Secondary | ICD-10-CM | POA: Diagnosis not present

## 2023-03-06 DIAGNOSIS — Z794 Long term (current) use of insulin: Secondary | ICD-10-CM | POA: Diagnosis not present

## 2023-03-06 DIAGNOSIS — Z4781 Encounter for orthopedic aftercare following surgical amputation: Secondary | ICD-10-CM | POA: Diagnosis not present

## 2023-03-06 DIAGNOSIS — E785 Hyperlipidemia, unspecified: Secondary | ICD-10-CM | POA: Diagnosis not present

## 2023-03-06 DIAGNOSIS — G8929 Other chronic pain: Secondary | ICD-10-CM | POA: Diagnosis not present

## 2023-03-06 DIAGNOSIS — E1151 Type 2 diabetes mellitus with diabetic peripheral angiopathy without gangrene: Secondary | ICD-10-CM | POA: Diagnosis not present

## 2023-03-06 DIAGNOSIS — I5030 Unspecified diastolic (congestive) heart failure: Secondary | ICD-10-CM | POA: Diagnosis not present

## 2023-03-06 DIAGNOSIS — Z7985 Long-term (current) use of injectable non-insulin antidiabetic drugs: Secondary | ICD-10-CM | POA: Diagnosis not present

## 2023-03-06 DIAGNOSIS — I152 Hypertension secondary to endocrine disorders: Secondary | ICD-10-CM | POA: Diagnosis not present

## 2023-03-06 DIAGNOSIS — Z9181 History of falling: Secondary | ICD-10-CM | POA: Diagnosis not present

## 2023-03-06 DIAGNOSIS — E1122 Type 2 diabetes mellitus with diabetic chronic kidney disease: Secondary | ICD-10-CM | POA: Diagnosis not present

## 2023-03-06 DIAGNOSIS — N182 Chronic kidney disease, stage 2 (mild): Secondary | ICD-10-CM | POA: Diagnosis not present

## 2023-03-06 DIAGNOSIS — Z7982 Long term (current) use of aspirin: Secondary | ICD-10-CM | POA: Diagnosis not present

## 2023-03-06 DIAGNOSIS — Z89612 Acquired absence of left leg above knee: Secondary | ICD-10-CM | POA: Diagnosis not present

## 2023-03-06 DIAGNOSIS — E1169 Type 2 diabetes mellitus with other specified complication: Secondary | ICD-10-CM | POA: Diagnosis not present

## 2023-03-06 DIAGNOSIS — E1159 Type 2 diabetes mellitus with other circulatory complications: Secondary | ICD-10-CM | POA: Diagnosis not present

## 2023-03-06 DIAGNOSIS — M5416 Radiculopathy, lumbar region: Secondary | ICD-10-CM | POA: Diagnosis not present

## 2023-03-06 NOTE — Progress Notes (Unsigned)
  Care Coordination Note  03/06/2023 Name: [name] MRN: [id number] DOB: [birth year]  Jennifer [name] is a 62 y.o. year old female who is a primary care patient of Angelica Pou, MD and is actively engaged with the care management team. I reached out to [name] by phone today to assist with re-scheduling an initial visit with the RN Case Manager  Follow up plan: Unsuccessful telephone outreach attempt made. A HIPAA compliant phone message was left for the patient providing contact information and requesting a return call.  [place]  Direct Dial: [phone]

## 2023-03-09 ENCOUNTER — Ambulatory Visit (INDEPENDENT_AMBULATORY_CARE_PROVIDER_SITE_OTHER): Payer: 59

## 2023-03-09 VITALS — BP 153/77 | HR 78

## 2023-03-09 DIAGNOSIS — Z89431 Acquired absence of right foot: Secondary | ICD-10-CM | POA: Diagnosis not present

## 2023-03-09 NOTE — Progress Notes (Signed)
  Care Coordination Note  03/09/2023 Name: AKUA GRUBICH MRN: [id number] DOB: 22-Nov-1961  Jennifer [name] is a 62 y.o. year old female who is a primary care patient of Angelica Pou, MD and is actively engaged with the care management team. I reached out to [name] by phone today to assist with re-scheduling an initial visit with the RN Case Manager  Follow up plan: Unsuccessful telephone outreach attempt made. A HIPAA compliant phone message was left for the patient providing contact information and requesting a return call.  We have been unable to make contact with the patient for follow up. The care management team is available to follow up with the patient after provider conversation with the patient regarding recommendation for care management engagement and subsequent re-referral to the care management team.   [place]  Direct Dial: [phone]

## 2023-03-09 NOTE — Progress Notes (Signed)
Patient presents today for post op visit # 3, patient of Dr. Jacqualyn Posey.   POV # 3 DOS  02/12/23 Right foot amputation    Half staples removed today without complication.  Incisions look good and no signs of infections. The remaining staples will be removed next week as advised by Dr. Jacqualyn Posey. Dr. Jacqualyn Posey also advised patient on the importance of quitting smoking. Patient stated she is trying and is down to about 1/2 pack daily.    Reviewed icing and elevation. Patient will follow up with Dr. Jacqualyn Posey in 1 week for staple removal.

## 2023-03-11 ENCOUNTER — Telehealth: Payer: Self-pay | Admitting: Dietician

## 2023-03-11 NOTE — Telephone Encounter (Unsigned)
Jennifer [name] needed help with applying and starting her new FL3 CGM. It was I warm up when we got off the phone. She asked for an appointment with Diabetes Educator on April 9 (the same day she see her doctor). Request a referral for this year.

## 2023-03-13 DIAGNOSIS — Z4781 Encounter for orthopedic aftercare following surgical amputation: Secondary | ICD-10-CM | POA: Diagnosis not present

## 2023-03-13 DIAGNOSIS — E1142 Type 2 diabetes mellitus with diabetic polyneuropathy: Secondary | ICD-10-CM | POA: Diagnosis not present

## 2023-03-13 DIAGNOSIS — I152 Hypertension secondary to endocrine disorders: Secondary | ICD-10-CM | POA: Diagnosis not present

## 2023-03-13 DIAGNOSIS — I5030 Unspecified diastolic (congestive) heart failure: Secondary | ICD-10-CM | POA: Diagnosis not present

## 2023-03-13 DIAGNOSIS — E1159 Type 2 diabetes mellitus with other circulatory complications: Secondary | ICD-10-CM | POA: Diagnosis not present

## 2023-03-13 DIAGNOSIS — Z9181 History of falling: Secondary | ICD-10-CM | POA: Diagnosis not present

## 2023-03-13 DIAGNOSIS — Z89431 Acquired absence of right foot: Secondary | ICD-10-CM | POA: Diagnosis not present

## 2023-03-13 DIAGNOSIS — E1169 Type 2 diabetes mellitus with other specified complication: Secondary | ICD-10-CM | POA: Diagnosis not present

## 2023-03-13 DIAGNOSIS — E785 Hyperlipidemia, unspecified: Secondary | ICD-10-CM | POA: Diagnosis not present

## 2023-03-13 DIAGNOSIS — G8929 Other chronic pain: Secondary | ICD-10-CM | POA: Diagnosis not present

## 2023-03-13 DIAGNOSIS — Z89612 Acquired absence of left leg above knee: Secondary | ICD-10-CM | POA: Diagnosis not present

## 2023-03-13 DIAGNOSIS — Z7985 Long-term (current) use of injectable non-insulin antidiabetic drugs: Secondary | ICD-10-CM | POA: Diagnosis not present

## 2023-03-13 DIAGNOSIS — E1122 Type 2 diabetes mellitus with diabetic chronic kidney disease: Secondary | ICD-10-CM | POA: Diagnosis not present

## 2023-03-13 DIAGNOSIS — Z794 Long term (current) use of insulin: Secondary | ICD-10-CM | POA: Diagnosis not present

## 2023-03-13 DIAGNOSIS — Z7984 Long term (current) use of oral hypoglycemic drugs: Secondary | ICD-10-CM | POA: Diagnosis not present

## 2023-03-13 DIAGNOSIS — E1151 Type 2 diabetes mellitus with diabetic peripheral angiopathy without gangrene: Secondary | ICD-10-CM | POA: Diagnosis not present

## 2023-03-13 DIAGNOSIS — N182 Chronic kidney disease, stage 2 (mild): Secondary | ICD-10-CM | POA: Diagnosis not present

## 2023-03-13 DIAGNOSIS — M5416 Radiculopathy, lumbar region: Secondary | ICD-10-CM | POA: Diagnosis not present

## 2023-03-13 DIAGNOSIS — Z7982 Long term (current) use of aspirin: Secondary | ICD-10-CM | POA: Diagnosis not present

## 2023-03-16 ENCOUNTER — Ambulatory Visit (INDEPENDENT_AMBULATORY_CARE_PROVIDER_SITE_OTHER): Payer: 59 | Admitting: Podiatry

## 2023-03-16 DIAGNOSIS — Z89431 Acquired absence of right foot: Secondary | ICD-10-CM

## 2023-03-16 DIAGNOSIS — T8189XA Other complications of procedures, not elsewhere classified, initial encounter: Secondary | ICD-10-CM | POA: Diagnosis not present

## 2023-03-17 DIAGNOSIS — M5416 Radiculopathy, lumbar region: Secondary | ICD-10-CM | POA: Diagnosis not present

## 2023-03-17 DIAGNOSIS — G8929 Other chronic pain: Secondary | ICD-10-CM | POA: Diagnosis not present

## 2023-03-17 DIAGNOSIS — Z9181 History of falling: Secondary | ICD-10-CM | POA: Diagnosis not present

## 2023-03-17 DIAGNOSIS — E1142 Type 2 diabetes mellitus with diabetic polyneuropathy: Secondary | ICD-10-CM | POA: Diagnosis not present

## 2023-03-17 DIAGNOSIS — Z794 Long term (current) use of insulin: Secondary | ICD-10-CM | POA: Diagnosis not present

## 2023-03-17 DIAGNOSIS — E1169 Type 2 diabetes mellitus with other specified complication: Secondary | ICD-10-CM | POA: Diagnosis not present

## 2023-03-17 DIAGNOSIS — Z89612 Acquired absence of left leg above knee: Secondary | ICD-10-CM | POA: Diagnosis not present

## 2023-03-17 DIAGNOSIS — Z4781 Encounter for orthopedic aftercare following surgical amputation: Secondary | ICD-10-CM | POA: Diagnosis not present

## 2023-03-17 DIAGNOSIS — Z7985 Long-term (current) use of injectable non-insulin antidiabetic drugs: Secondary | ICD-10-CM | POA: Diagnosis not present

## 2023-03-17 DIAGNOSIS — E1151 Type 2 diabetes mellitus with diabetic peripheral angiopathy without gangrene: Secondary | ICD-10-CM | POA: Diagnosis not present

## 2023-03-17 DIAGNOSIS — E1122 Type 2 diabetes mellitus with diabetic chronic kidney disease: Secondary | ICD-10-CM | POA: Diagnosis not present

## 2023-03-17 DIAGNOSIS — Z89431 Acquired absence of right foot: Secondary | ICD-10-CM | POA: Diagnosis not present

## 2023-03-17 DIAGNOSIS — Z7984 Long term (current) use of oral hypoglycemic drugs: Secondary | ICD-10-CM | POA: Diagnosis not present

## 2023-03-17 DIAGNOSIS — N182 Chronic kidney disease, stage 2 (mild): Secondary | ICD-10-CM | POA: Diagnosis not present

## 2023-03-17 DIAGNOSIS — E1159 Type 2 diabetes mellitus with other circulatory complications: Secondary | ICD-10-CM | POA: Diagnosis not present

## 2023-03-17 DIAGNOSIS — I5030 Unspecified diastolic (congestive) heart failure: Secondary | ICD-10-CM | POA: Diagnosis not present

## 2023-03-17 DIAGNOSIS — E785 Hyperlipidemia, unspecified: Secondary | ICD-10-CM | POA: Diagnosis not present

## 2023-03-17 DIAGNOSIS — I152 Hypertension secondary to endocrine disorders: Secondary | ICD-10-CM | POA: Diagnosis not present

## 2023-03-17 DIAGNOSIS — Z7982 Long term (current) use of aspirin: Secondary | ICD-10-CM | POA: Diagnosis not present

## 2023-03-18 ENCOUNTER — Telehealth: Payer: Self-pay | Admitting: *Deleted

## 2023-03-18 NOTE — Telephone Encounter (Signed)
Received call from pt who stated she needs a refill on Anoro Ellipta 1 puff daily; not on current med list; send rx to [facility].  Also requesting something for rash in groin areas;"like a diaper rash" - pt states nystatin powder is not helping; [facility]. Thanks

## 2023-03-18 NOTE — Progress Notes (Signed)
Subjective: Chief Complaint  Patient presents with   Follow-up    Staple removal, right foot, no drainage      Jennifer [name] is a 62 y.o. is seen today in office s/p right transmetatarsal amputation preformed on 02/12/2023.  States that she is doing well.  She has not had any fevers or chills.  She has been off of it is much as possible.  She has been having dressing changes.  She has not seen any increase in swelling and seems to be improving as well.    Objective: General: No acute distress, AAOx3 -son is present. DP/PT pulses palpable , CRT < 3 sec to all digits.  Right foot: Incision is well coapted without any evidence of dehiscence with staples intact.  Decreased edema.  There is no drainage or pus.  There is no fluctuance or crepitation.  No.  No obvious signs of infection noted today.  No pain with calf compression, swelling, warmth, erythema.   Assessment and Plan:  Status post right foot transmetatarsal amputation, localized erythema  -Treatment options discussed including all alternatives, risks, and complications Remainder of the staples removed today without complications.  Steri-Strips applied for reinforcement.  Betadine was applied followed by dressing.  Continue with daily dressing changes.  For now and continue with nonweightbearing, elevation. -Monitor for any clinical signs or symptoms of infection and DVT/PE and directed to call the office immediately should any occur or go to the ER. -Follow-up as scheduled or sooner if any problems arise. In the meantime, encouraged to call the office with any questions, concerns, change in symptoms.   *Likely start to transition to partial weightbearing and full weightbearing next appointment.  Physical therapy if needed.  Celesta Gentile, DPM

## 2023-03-20 ENCOUNTER — Ambulatory Visit (HOSPITAL_COMMUNITY)
Admission: RE | Admit: 2023-03-20 | Discharge: 2023-03-20 | Disposition: A | Discharge status: Home / Self Care | Payer: 59 | Source: Ambulatory Visit | Attending: Student in an Organized Health Care Education/Training Program | Admitting: Student in an Organized Health Care Education/Training Program

## 2023-03-20 DIAGNOSIS — M79661 Pain in right lower leg: Secondary | ICD-10-CM | POA: Diagnosis not present

## 2023-03-24 ENCOUNTER — Other Ambulatory Visit: Payer: Self-pay

## 2023-03-24 DIAGNOSIS — E119 Type 2 diabetes mellitus without complications: Secondary | ICD-10-CM

## 2023-03-24 MED ORDER — TRESIBA FLEXTOUCH 200 UNIT/ML ~~LOC~~ SOPN: PEN_INJECTOR | SUBCUTANEOUS | 3 refills | Status: DC

## 2023-03-24 NOTE — Telephone Encounter (Signed)
Called pt to relay her doctor's response - no answer; left message on pt's self-identified vm to call the office and schedule an appt. Pt has an appt already schedule on 4/9.

## 2023-03-26 ENCOUNTER — Other Ambulatory Visit: Payer: 59

## 2023-03-30 ENCOUNTER — Telehealth: Payer: Self-pay | Admitting: Podiatry

## 2023-03-30 ENCOUNTER — Other Ambulatory Visit: Payer: 59

## 2023-03-30 ENCOUNTER — Other Ambulatory Visit: Payer: Self-pay | Admitting: Pharmacist

## 2023-03-30 NOTE — Progress Notes (Signed)
Patient outreached by Karlton Lemon, PharmD Candidate on 03/30/2023 to discuss hypertension.    Patient has an automated home blood pressure machine. However, they do not use it regularly and did not know their at home readings. Patient was advised to take their BP once per day using the cuff if possible.   Medication review was performed. They are taking medications as prescribed.   The following barriers to adherence were noted:  - They do have cost concerns with their Ozempic.  - They do have transportation concerns. Patient reports needed to find someone to drive them to their appointments, which can be difficult. Patient was advised to ask their doctor about transportation services at their next appointment.  - They do need assistance obtaining refills. Patient reports difficultly getting to pharmacy to pick up medications.  - They do not occasionally forget to take some of their prescribed medications. They do not have adherence issues, however no adherence strategies identified.  - They do not feel like one/some of their medications make them feel poorly.  - They do not have questions or concerns about their medications.  - They do have follow up scheduled with their primary care provider/cardiologist.   The following interventions were completed:  - Medications were reviewed  - Patient was educated on goal blood pressures and long term health implications of elevated blood pressure  - Patient was educated on proper technique to check home blood pressure and reminded to bring home machine and readings to next provider appointment  - Patient was educated on medications, including indication and administration  - Patient was educated on use of adherence strategies, like a pill box or alarms  - Patient was counseled on benefit of tobacco cessation. Patient reports wanting to quit but that they have not been able to obtain nicotine patches. Patient was advised to bring this up at next  appointment.   The patient has follow up scheduled:  03/31/2023 with Dr. Glade Nurse, PharmD Candidate   Consider [phone] for Pharmacy support for the above concerns.   Catie Eppie Gibson, PharmD, BCACP, CPP [facility] [phone]

## 2023-03-30 NOTE — Telephone Encounter (Signed)
Pt left message on my voicemail  today at 943am stating she missed her appt on Friday and needs to r/s. She states she left a message Friday but no one called her back.  I returned her call and she is on the nurse schedule for Wednesday and she is aware she will not be seeing Dr Ardelle Anton. He is not in the office on Wednesdays and is only here a few hours on Thursday. Will this be ok?

## 2023-03-31 ENCOUNTER — Ambulatory Visit (INDEPENDENT_AMBULATORY_CARE_PROVIDER_SITE_OTHER): Payer: 59 | Admitting: Dietician

## 2023-03-31 ENCOUNTER — Other Ambulatory Visit (HOSPITAL_COMMUNITY): Payer: Self-pay

## 2023-03-31 ENCOUNTER — Other Ambulatory Visit: Payer: Self-pay | Admitting: Dietician

## 2023-03-31 ENCOUNTER — Ambulatory Visit (INDEPENDENT_AMBULATORY_CARE_PROVIDER_SITE_OTHER): Payer: 59 | Admitting: Internal Medicine

## 2023-03-31 ENCOUNTER — Ambulatory Visit (INDEPENDENT_AMBULATORY_CARE_PROVIDER_SITE_OTHER): Payer: 59

## 2023-03-31 VITALS — BP 140/69 | HR 64 | Temp 97.8°F | Ht 65.0 in | Wt 222.0 lb

## 2023-03-31 DIAGNOSIS — E669 Obesity, unspecified: Secondary | ICD-10-CM

## 2023-03-31 DIAGNOSIS — Z9181 History of falling: Secondary | ICD-10-CM

## 2023-03-31 DIAGNOSIS — I499 Cardiac arrhythmia, unspecified: Secondary | ICD-10-CM | POA: Diagnosis not present

## 2023-03-31 DIAGNOSIS — R112 Nausea with vomiting, unspecified: Secondary | ICD-10-CM

## 2023-03-31 DIAGNOSIS — E1122 Type 2 diabetes mellitus with diabetic chronic kidney disease: Secondary | ICD-10-CM | POA: Diagnosis not present

## 2023-03-31 DIAGNOSIS — E1169 Type 2 diabetes mellitus with other specified complication: Secondary | ICD-10-CM | POA: Diagnosis not present

## 2023-03-31 DIAGNOSIS — L304 Erythema intertrigo: Secondary | ICD-10-CM | POA: Diagnosis not present

## 2023-03-31 DIAGNOSIS — E1151 Type 2 diabetes mellitus with diabetic peripheral angiopathy without gangrene: Secondary | ICD-10-CM | POA: Diagnosis not present

## 2023-03-31 DIAGNOSIS — B372 Candidiasis of skin and nail: Secondary | ICD-10-CM

## 2023-03-31 DIAGNOSIS — L989 Disorder of the skin and subcutaneous tissue, unspecified: Secondary | ICD-10-CM

## 2023-03-31 DIAGNOSIS — F1120 Opioid dependence, uncomplicated: Secondary | ICD-10-CM

## 2023-03-31 DIAGNOSIS — N182 Chronic kidney disease, stage 2 (mild): Secondary | ICD-10-CM

## 2023-03-31 DIAGNOSIS — Z720 Tobacco use: Secondary | ICD-10-CM

## 2023-03-31 DIAGNOSIS — Z794 Long term (current) use of insulin: Secondary | ICD-10-CM

## 2023-03-31 DIAGNOSIS — I872 Venous insufficiency (chronic) (peripheral): Secondary | ICD-10-CM | POA: Diagnosis not present

## 2023-03-31 DIAGNOSIS — N3946 Mixed incontinence: Secondary | ICD-10-CM

## 2023-03-31 DIAGNOSIS — F17210 Nicotine dependence, cigarettes, uncomplicated: Secondary | ICD-10-CM

## 2023-03-31 DIAGNOSIS — K635 Polyp of colon: Secondary | ICD-10-CM | POA: Diagnosis not present

## 2023-03-31 DIAGNOSIS — Z6830 Body mass index (BMI) 30.0-30.9, adult: Secondary | ICD-10-CM

## 2023-03-31 DIAGNOSIS — E785 Hyperlipidemia, unspecified: Secondary | ICD-10-CM

## 2023-03-31 DIAGNOSIS — E1159 Type 2 diabetes mellitus with other circulatory complications: Secondary | ICD-10-CM | POA: Diagnosis not present

## 2023-03-31 DIAGNOSIS — Z89431 Acquired absence of right foot: Secondary | ICD-10-CM

## 2023-03-31 DIAGNOSIS — R2689 Other abnormalities of gait and mobility: Secondary | ICD-10-CM

## 2023-03-31 DIAGNOSIS — I152 Hypertension secondary to endocrine disorders: Secondary | ICD-10-CM | POA: Diagnosis not present

## 2023-03-31 DIAGNOSIS — E1142 Type 2 diabetes mellitus with diabetic polyneuropathy: Secondary | ICD-10-CM

## 2023-03-31 DIAGNOSIS — F332 Major depressive disorder, recurrent severe without psychotic features: Secondary | ICD-10-CM

## 2023-03-31 DIAGNOSIS — Z0000 Encounter for general adult medical examination without abnormal findings: Secondary | ICD-10-CM

## 2023-03-31 DIAGNOSIS — E118 Type 2 diabetes mellitus with unspecified complications: Secondary | ICD-10-CM

## 2023-03-31 DIAGNOSIS — R269 Unspecified abnormalities of gait and mobility: Secondary | ICD-10-CM

## 2023-03-31 LAB — POCT GLYCOSYLATED HEMOGLOBIN (HGB A1C): Hemoglobin A1C: 6.3 % — AB (ref 4.0–5.6)

## 2023-03-31 LAB — GLUCOSE, CAPILLARY: Glucose-Capillary: 104 mg/dL — ABNORMAL HIGH (ref 70–99)

## 2023-03-31 MED ORDER — FREESTYLE LIBRE 3 SENSOR MISC
3 refills | Status: DC
  Filled 2023-03-31 – 2023-04-01 (×2): qty 6, 84d supply, fill #0
  Filled 2023-07-06: qty 6, 84d supply, fill #1
  Filled 2023-09-22: qty 6, 84d supply, fill #2
  Filled 2023-12-15 – 2023-12-16 (×2): qty 6, 84d supply, fill #3

## 2023-03-31 MED ORDER — NYSTATIN 100000 UNIT/GM EX CREA
1.0000 | TOPICAL_CREAM | Freq: Two times a day (BID) | CUTANEOUS | 1 refills | Status: DC
  Filled 2023-03-31 – 2023-04-01 (×2): qty 30, 15d supply, fill #0

## 2023-03-31 MED ORDER — NICOTINE 21 MG/24HR TD PT24
21.0000 mg | MEDICATED_PATCH | TRANSDERMAL | 0 refills | Status: AC
  Filled 2023-03-31 – 2023-04-01 (×2): qty 14, 14d supply, fill #0

## 2023-03-31 NOTE — Progress Notes (Signed)
Subjective:   Jennifer [name] is a 62 y.o. female who presents for an Initial Medicare Annual Wellness Visit. I connected with  Jennifer [name] on 03/31/23 by a  Face-To-Face encounter   and verified that I am speaking with the correct person using two identifiers.  Patient Location: Other:  Office/Clinic  Provider Location: Office/Clinic  I discussed the limitations of evaluation and management by telemedicine. The patient expressed understanding and agreed to proceed.  Review of Systems    Defer to PCP       Objective:    Today's Vitals   03/31/23 1421  BP: (!) 140/69  Pulse: 64  Temp: 97.8 F (36.6 C)  TempSrc: Oral  SpO2: 95%  Weight: 222 lb (100.7 kg)  Height: 5\' 5"  (1.651 m)  PainSc: 10-Worst pain ever   Body mass index is 36.94 kg/m.     03/31/2023    2:23 PM 03/31/2023    9:23 AM 02/23/2023    1:29 PM 02/10/2023   12:07 PM 12/25/2022   11:57 AM 12/04/2022   11:51 AM 10/21/2022   10:47 AM  Advanced Directives  Does Patient Have a Medical Advance Directive? No No No No No No No  Would patient like information on creating a medical advance directive? No - Patient declined No - Patient declined No - Patient declined No - Patient declined No - Patient declined No - Patient declined No - Patient declined    Current Medications (verified) Outpatient Encounter Medications as of 03/31/2023  Medication Sig   acetaminophen (TYLENOL) 650 MG CR tablet Take 2 tablets (1,300 mg total) by mouth every 8 (eight) hours as needed for pain.   albuterol (PROAIR HFA) 108 (90 Base) MCG/ACT inhaler INHALE 2 PUFFS BY MOUTH EVERY 6 HOURS AS NEEDED FOR WHEEZING (Patient taking differently: Inhale 1 puff into the lungs every 6 (six) hours as needed for shortness of breath.)   amLODipine (NORVASC) 10 MG tablet Take 1 tablet (10 mg total) by mouth daily.   aspirin EC 81 MG tablet Take 1 tablet (81 mg total) by mouth daily.   baclofen (LIORESAL) 10 MG tablet Take 0.5 tablets (5 mg total) by  mouth 2 (two) times daily.   camphor-menthol (SARNA) lotion Apply topically as needed for itching.   Continuous Blood Gluc Receiver (FREESTYLE LIBRE 3 READER) DEVI 1 each by Does not apply route continuous. Use to continuously monitor blood glucose   Continuous Blood Gluc Sensor (FREESTYLE LIBRE 3 SENSOR) MISC 1 each by Does not apply route 6 (six) times daily. Place 1 sensor on the skin every 14 days. Use to check glucose continuously   DULoxetine (CYMBALTA) 30 MG capsule Take 1 capsule (30 mg total) by mouth 2 (two) times daily.   glucose blood test strip Use 3 (three) times daily.   insulin degludec (TRESIBA FLEXTOUCH) 200 UNIT/ML FlexTouch Pen INJECT 20 UNITS UNDER THE SKIN DAILY   Insulin Pen Needle 32G X 4 MM MISC Use to inject insulin 4 times a day and semaglutide once weekly   Lancets MISC Use as directed   metFORMIN (GLUCOPHAGE) 500 MG tablet Take 1 tablet (500 mg total) by mouth 2 (two) times daily.   metoprolol succinate (TOPROL-XL) 100 MG 24 hr tablet Take 1 tablet (100 mg total) by mouth daily. TAKE WITH OR IMMEDIATELY FOLLOWING A MEAL. (Patient taking differently: Take 100 mg by mouth daily.)   Multiple Vitamin (MULTIVITAMIN) tablet Take 1 tablet by mouth daily.   MYRBETRIQ 50 MG TB24  tablet Take 1 tablet (50 mg total) by mouth daily.   nicotine (NICODERM CQ - DOSED IN MG/24 HOURS) 21 mg/24hr patch Place 1 patch (21 mg total) onto the skin daily for 14 days.   nystatin cream (MYCOSTATIN) Apply 1 Application topically 2 (two) times daily to clean dry skin   ondansetron (ZOFRAN-ODT) 4 MG disintegrating tablet Take 1 tablet (4 mg total) by mouth every 8 (eight) hours as needed for nausea or vomiting.   OVER THE COUNTER MEDICATION Take 1 tablet by mouth daily. Brain Health otc supplement   oxybutynin (DITROPAN-XL) 10 MG 24 hr tablet Take 10 mg by mouth daily.   oxyCODONE (OXY IR/ROXICODONE) 5 MG immediate release tablet Take 1 tablet (5 mg total) by mouth every 4 (four) hours as needed  for severe pain.   pantoprazole (PROTONIX) 40 MG tablet Take 1 tablet (40 mg total) by mouth daily.   polyethylene glycol powder (GLYCOLAX/MIRALAX) 17 GM/SCOOP powder Take 17 g by mouth daily as needed for mild constipation.   povidone-iodine (BETADINE) 10 % external solution Apply 1 Application topically as needed for wound care.   pregabalin (LYRICA) 200 MG capsule TAKE 1 CAPSULE BY MOUTH THREE TIMES A DAY (Patient taking differently: Take 200 mg by mouth daily.)   rosuvastatin (CRESTOR) 20 MG tablet Take 1 tablet (20 mg total) by mouth in the morning. (Patient taking differently: Take 20 mg by mouth daily.)   Semaglutide, 1 MG/DOSE, (OZEMPIC, 1 MG/DOSE,) 4 MG/3ML SOPN Inject 1 mg into the skin once a week.   senna-docusate (SENOKOT-S) 8.6-50 MG tablet Take 1 tablet by mouth at bedtime.   traZODone (DESYREL) 100 MG tablet Take 2 tablets (200 mg total) by mouth at bedtime as needed for sleep.   [DISCONTINUED] Potassium Chloride ER 20 MEQ TBCR Take 40 mEq by mouth daily.   No facility-administered encounter medications on file as of 03/31/2023.    Allergies (verified) Benazepril, Chantix [varenicline], Ioversol, Morphine sulfate, Pineapple, Abilify [aripiprazole], Iohexol, and Ivp dye [iodinated contrast media]   History: Past Medical History:  Diagnosis Date   Acute vestibular syndrome, resolved 03/02/2021   Angioedema 06/14/2021   Asthma    Burn of finger of right hand, second degree 02/05/2021   Occurred during cooking (frying), poor sensation due to neuropathy, pt punctured blister to allow it to drain, skin has since desquamated over dorsal joint, no infection.  Keep clean and dry, OTC antibacterial ointment.   Cataract    CHF (congestive heart failure)    Chronic bronchitis    "I get it alot" (09/28/2013)   Chronic diastolic heart failure    grade 2 per 2D echocardiogram (01/2013)   Chronic kidney disease    Chronic lower back pain    Chronic pain syndrome 12/03/2011   Likely  secondary to depression, "fibromyalgia", neuropathy, and obesity. Lumbar MRI 2014 no sig change from prior (2008) : Stable hypertrophic facet disease most notable at L4-5. Stable shallow left foraminal/extraforaminal disc protrusion at L4-5. No direct neural compression.       Chronic right shoulder pain 10/10/2021   COPD 01/08/2007   PFT's 05/2007 : FEV1/FVC 82, FEV1 64% pred, FEF 25-75% 40% predicted, 16% improvement in FEV1 with bronchodilators.      Depression    Diabetic peripheral neuropathy    Dizziness, resolved (admitted with vestibular migraine)    DVT of upper extremity (deep vein thrombosis) 03/11/2013   Secondary to PICC line. Right brachial vein, diagnosed on 03/10/2013 Coumadin for 3 months. 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  S. Aureus and GBS.    History of use of hearing aid    Hyperlipidemia    Hyperplastic colon polyp 12/2010   Per colonoscopy (12/2010) - Dr. Arlyce Dice   Hypertension    Juvenile rheumatoid arthritis    Diagnosed age 56; treated initially with "lots of aspirin"   Nausea and vomiting 11/24/2008   Parotid nodules, resected 05/2022, benign 07/11/2021   Incidental finding 03/04/21  "multiple bilateral parotid nodules the largest in the right gland measuring 11 mm", asymptomatic.       Initially evaluated 08/2019 with dedicated MRI: "IMPRESSION:  Skin marker overlies a 9 x 10 x 11 mm cyst within the anterior  aspect of the superficial lobe of the right parotid gland. This is  presumed to be a benign cyst. The possibility of a  cystic Warthin's  tumor   Pneumonia    PVD (peripheral vascular disease) with claudication    Stents to bilateral common iliac arteries (left 2005, right 2008), on chronic plavix   Pyelonephritis 10/28/2020   S/P BKA (below knee amputation) unilateral    2014 L - failed limb preserving treatment. 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  Evaluated on 07/08/2021 by podiatrist Dr. Logan Bores who debrided necrotic tissue and prescribed doxycycline.  He will see her again in 3 weeks.  The location of this ulcer on the dorsal aspect of the toe is somewhat atypical for a purely diabetic foot wound, and she does have a strong DP pulse.  I did not examine h   Type II diabetes mellitus with peripheral circulatory disorders, uncontrolled DX: 1993   Insulin dep. Poor control. Complicated by diabetic foot ulcer and diabetic eye disease.     Upper respiratory tract infection due to COVID-19 virus 07/22/2022   Urinary incontinence, severe, mixed (stress, urge, functional) 02/14/2023   Unresponsive to medications   Past Surgical History:  Procedure Laterality Date   ABDOMINAL HYSTERECTOMY  1997   secondary to uterine fibroids   AMPUTATION Left 08/31/2013   Procedure: AMPUTATION RAY;  Surgeon: Nadara Mustard, MD;  Location: [facility] OR;  Service: Orthopedics;  Laterality: Left;  Left Foot 5th Ray Amputation   AMPUTATION Left 09/28/2013   Procedure: Left Midfoot amputation;  Surgeon: Nadara Mustard, MD;  Location: [facility] OR;  Service: Orthopedics;  Laterality: Left;  Left Midfoot amputation   AMPUTATION Left 10/14/2013   Procedure: AMPUTATION BELOW KNEE- left;  Surgeon: Nadara Mustard, MD;  Location: [facility] OR;  Service: Orthopedics;  Laterality: Left;  Left Below Knee Amputation    AMPUTATION TOE  Right 01/15/2017   Procedure: AMPUTATION 5th TOE RIGHT FOOT;  Surgeon: Felecia Shelling, DPM;  Location: [facility] OR;  Service:  Podiatry;  Laterality: Right;   AMPUTATION TOE Right 02/12/2023   Procedure: Right Foot Transmetatarsal Amputation;  Surgeon: [name], DPM;  Location: [facility] OR;  Service: Podiatry;  Laterality: Right;   APPLICATION OF WOUND VAC  04/01/2019   Procedure: Application Of Wound Vac;  Surgeon: Nadara Mustard, MD;  Location: [facility] OR;  Service: Orthopedics;;   BLADDER SURGERY     bladder reconstruction surgery   BOTOX INJECTION N/A 08/21/2021   Procedure: CYSTOSCOPY BOTOX INJECTION;  Surgeon: Crista Elliot, MD;  Location: [facility] ORS;  Service: Urology;  Laterality: N/A;   BREAST BIOPSY     multiple-benign per pt   CATARACT EXTRACTION, BILATERAL     summer 2022   COLONOSCOPY     DILATION AND CURETTAGE OF UTERUS  1985   ESOPHAGOGASTRODUODENOSCOPY N/A 09/20/2013   Procedure: ESOPHAGOGASTRODUODENOSCOPY (EGD);  Surgeon: Beverley Fiedler, MD;  Location: [facility] ENDOSCOPY;  Service: Gastroenterology;  Laterality: N/A;   EYE SURGERY Bilateral 2022   Cataract removal in June and then July   FOOT AMPUTATION THROUGH METATARSAL Left 09/28/2013   GANGLION CYST EXCISION     multiple   PAROTIDECTOMY Right 05/14/2022   Procedure: PAROTIDECTOMY;  Surgeon: Christia Reading, MD;  Location: [facility] OR;  Service: ENT;  Laterality: Right;   PERIPHERAL VASCULAR INTERVENTION     stents in lower ext   PREAURICULAR CYST EXCISION N/A 05/14/2022   Procedure: EXCISION OF SCALP SKIN CYST, 1.5cm;  Surgeon: Christia Reading, MD;  Location: [facility] OR;  Service: ENT;  Laterality: N/A;   SHOULDER ARTHROSCOPY Right 11/11/2019   RIGHT SHOULDER ARTHROSCOPY AND DEBRIDEMENT    SHOULDER ARTHROSCOPY Right 11/11/2019   Procedure: RIGHT SHOULDER ARTHROSCOPY AND DEBRIDEMENT;  Surgeon: Nadara Mustard, MD;  Location: [facility] OR;  Service: Orthopedics;  Laterality: Right;   SHOULDER ARTHROSCOPY W/ ROTATOR CUFF REPAIR Bilateral    2 on right one  on left   SKIN SPLIT GRAFT Bilateral 05/13/2013   Procedure: Right and Left Foot Allograft Skin Graft;  Surgeon: Nadara Mustard, MD;  Location: [facility] OR;  Service: Orthopedics;  Laterality: Bilateral;  Right and Left Foot Allograft Skin Graft   STUMP REVISION Left 04/01/2019   Procedure: REVISION LEFT BELOW KNEE AMPUTATION;  Surgeon: Nadara Mustard, MD;  Location: [facility] OR;  Service: Orthopedics;  Laterality: Left;   TEE WITHOUT CARDIOVERSION N/A 01/31/2013   Procedure: TRANSESOPHAGEAL ECHOCARDIOGRAM (TEE);  Surgeon: Pricilla Riffle, MD;  Location: [facility] ENDOSCOPY;  Service: Cardiovascular;  Laterality: N/A;  Rm [id number]   TEE WITHOUT CARDIOVERSION N/A 03/10/2013   Procedure: TRANSESOPHAGEAL ECHOCARDIOGRAM (TEE);  Surgeon: Laurey Morale, MD;  Location: [facility] ENDOSCOPY;  Service: Cardiovascular;  Laterality: N/A;  Rm. [id number]   TOE AMPUTATION Left 08/31/2013   4TH & 5 TH TOE    TONSILLECTOMY     TUBAL LIGATION     WRIST SURGERY Right    "for tumors" (09/28/2013)   Family History  Problem Relation Age of Onset   Diverticulosis Mother    Diabetes Mother    Hypertension Mother    Congestive Heart Failure Mother    Asthma Father    CAD Sister 23       MI at age 68 per patient.  However, she has not had a stent or CABG.    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   Vaping Use   Vaping Use: Never used  Substance  and Sexual Activity   Alcohol use: No    Alcohol/week: 0.0 standard drinks of alcohol   Drug use: No    Types: Marijuana, "Crack" cocaine    Comment: 09/28/2013 "no marijuana since 2011, no crack/cocaine 1989"   Sexual activity: Not Currently  Other Topics Concern   Not on file  Social History Narrative   On disability. Lives with son in [place]. 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    Boyfriend passed away stage 4 cancer 11-Mar-2013.   S/p L BKA 2014. In wheelchair in paritially suitable apartment.    Social Determinants of Health   Financial Resource Strain: High Risk (03/31/2023)   Overall Financial Resource Strain (CARDIA)    Difficulty of Paying Living Expenses: Hard  Food Insecurity: Food Insecurity Present (03/31/2023)   Hunger Vital Sign    Worried About Running Out of Food in the Last Year: Often true    Ran Out of Food in the Last Year: Often true  Transportation Needs: Unmet Transportation Needs (03/31/2023)   PRAPARE - Administrator, Civil Service (Medical): Yes    Lack of Transportation (Non-Medical): Yes  Physical Activity: Inactive (03/31/2023)   Exercise Vital Sign    Days of Exercise per Week: 0 days    Minutes of Exercise per Session: 0 min  Stress: Stress Concern Present (03/31/2023)   [employer] of Occupational Health - Occupational Stress Questionnaire    Feeling of Stress : Very much  Social Connections: Moderately Integrated (03/31/2023)   Social Connection and Isolation Panel [NHANES]    Frequency of Communication with Friends and Family: More than three times a week    Frequency of Social Gatherings with Friends and Family: Once a week    Attends Religious Services: 1 to 4 times per year    Active Member of [employer] or Organizations: Yes    Attends Banker Meetings: 1 to 4 times per year    Marital Status: Divorced    Tobacco Counseling Ready to quit: Not Answered Counseling given: Not Answered Tobacco comments: 1/2 -1 PPD, Would like  nicotene patches sent to help quit.    Clinical Intake:  Pre-visit preparation completed: Yes  Pain : 0-10 Pain Score: 10-Worst pain ever Pain Type: Chronic pain Pain Location: Leg Pain Orientation: Right, Left Pain Descriptors / Indicators: Aching, Constant Pain Onset: More than a month ago Pain Frequency: Constant     Nutritional Risks: None Diabetes: Yes CBG done?: Yes CBG resulted in Enter/ Edit results?: Yes Did pt. bring in CBG monitor from home?: Yes Glucose Meter Downloaded?: Yes  How often do you need to have someone help you when you read instructions, pamphlets, or other written materials from your doctor or pharmacy?: 1 - Never  Diabetic?Nutrition Risk Assessment:  Has the patient  had any N/V/D within the last 2 months?  No  Does the patient have any non-healing wounds?  No  Has the patient had any unintentional weight loss or weight gain?  No   Diabetes:  Is the patient diabetic?  Yes  If diabetic, was a CBG obtained today?  Yes  Did the patient bring in their glucometer from home?  Yes    Financial Strains and Diabetes Management:  Are you having any financial strains with the device, your supplies or your medication? Yes .  Does the patient want to be seen by Chronic Care Management for management of their diabetes?  No  Would the patient like to be referred to a Nutritionist or for Diabetic Management?  No   Diabetic Exams:  Diabetic Eye Exam: Completed 01/19/2023 Diabetic Foot Exam: Completed 12/04/2022    Interpreter Needed?: No  Information entered by :: Bradlee Bridgers,cma   Activities of Daily Living    03/31/2023    2:23 PM 03/31/2023    9:23 AM  In your present state of health, do you have any difficulty performing the following activities:  Hearing? 0 0  Vision? 0 0  Difficulty concentrating or making decisions? 0 0  Walking or climbing stairs? 1 1  Dressing or bathing? 0 0  Doing errands, shopping? 0 0    Patient Care  Team: Miguel Aschoff, MD as PCP - General (Internal Medicine) Blair Promise, OD (Optometry) [place], Heather, OD as Consulting Physician (Optometry) Nadara Mustard, MD as Attending Physician (Orthopedic Surgery) Crista Elliot, MD as Consulting Physician (Urology) Juanell Fairly, RN as [facility] Care Management  Indicate any recent Medical Services you may have received from other than [facility] providers in the past year (date may be approximate).     Assessment:   This is a routine wellness examination for [place].  Hearing/Vision screen No results found.  Dietary issues and exercise activities discussed:     Goals Addressed   None   Depression Screen    03/31/2023    2:23 PM 03/31/2023    9:23 AM 02/23/2023    2:32 PM 12/04/2022    1:03 PM 09/10/2022    3:14 PM 09/01/2022    9:38 AM 06/26/2022   11:45 AM  PHQ 2/9 Scores  PHQ - 2 Score 0 0 0  0 0   PHQ- 9 Score     6 0   Exception Documentation    Medical reason   Patient refusal    Fall Risk    03/31/2023    2:23 PM 03/31/2023    9:35 AM 03/31/2023    9:22 AM 02/23/2023    1:28 PM 02/23/2023    1:24 PM  Fall Risk   Falls in the past year? 1  1 1 1   Number falls in past yr: 1  1 0 1  Injury with Fall? 0  0 0 0  Risk for fall due to : History of fall(s);Impaired balance/gait;Impaired mobility Impaired balance/gait;Impaired mobility  Impaired balance/gait;Impaired mobility;History of fall(s) Impaired balance/gait;Impaired mobility  Follow up Falls evaluation completed;Falls prevention discussed  Falls evaluation completed Falls evaluation completed Falls evaluation completed    FALL RISK PREVENTION PERTAINING TO THE HOME:  Any stairs in or around the home? Yes  If so, are there any without handrails? No  Home free of loose throw rugs in walkways, pet beds, electrical cords, etc? No  Adequate lighting in your home to reduce risk of falls? No  ASSISTIVE DEVICES UTILIZED TO PREVENT FALLS:  Life alert? No   Use of a cane, walker or w/c? Yes  Grab bars in the bathroom? No  Shower chair or bench in shower? Yes  Elevated toilet seat or a handicapped toilet? Yes   TIMED UP AND GO:  Was the test performed? No .  Length of time to ambulate 10 feet: 0 sec.   Gait slow and steady with assistive device  Cognitive Function:        03/31/2023    2:23 PM  6CIT Screen  What Year? 0 points  What month? 0 points  What time? 0 points  Count back from 20 0 points  Months in reverse 0 points  Repeat phrase 0 points  Total Score 0 points    Immunizations Immunization History  Administered Date(s) Administered   Fluad Quad(high Dose 65+) 09/10/2022   Influenza Split 10/16/2011, 09/23/2012   Influenza Whole 08/20/2010   Influenza,inj,Quad PF,6+ Mos 09/01/2013, 10/05/2014, 09/06/2015, 09/25/2016, 09/24/2017, 10/21/2018, 10/19/2019, 10/25/2020, 09/19/2021   Pneumococcal Polysaccharide-23 10/16/2011   Td 07/15/2010   Tdap 03/25/2022   Unspecified SARS-COV-2 Vaccination 03/15/2020, 04/05/2020    TDAP status: Up to date  Flu Vaccine status: Up to date  Pneumococcal vaccine status: Due, Education has been provided regarding the importance of this vaccine. Advised may receive this vaccine at local pharmacy or Health Dept. Aware to provide a copy of the vaccination record if obtained from local pharmacy or Health Dept. Verbalized acceptance and understanding.  Covid-19 vaccine status: Completed vaccines  Qualifies for Shingles Vaccine? No   Zostavax completed No   Shingrix Completed?: No.    Education has been provided regarding the importance of this vaccine. Patient has been advised to call insurance company to determine out of pocket expense if they have not yet received this vaccine. Advised may also receive vaccine at local pharmacy or Health Dept. Verbalized acceptance and understanding.  Screening Tests Health Maintenance  Topic Date Due   Zoster Vaccines- Shingrix (1 of 2) Never done    COVID-19 Vaccine (3 - Mixed Product risk series) 05/03/2020   COLONOSCOPY (Pts 45-37yrs Insurance coverage will need to be confirmed)  01/15/2021   Diabetic kidney evaluation - Urine ACR  02/06/2023   LIPID PANEL  03/26/2023   HEMOGLOBIN A1C  06/30/2023   INFLUENZA VACCINE  07/23/2023   FOOT EXAM  12/05/2023   OPHTHALMOLOGY EXAM  01/20/2024   Lung Cancer Screening  01/27/2024   Diabetic kidney evaluation - eGFR measurement  02/16/2024   Medicare Annual Wellness (AWV)  03/30/2024   MAMMOGRAM  05/08/2024   DTaP/Tdap/Td (3 - Td or Tdap) 03/25/2032   Hepatitis C Screening  Completed   HIV Screening  Completed   HPV VACCINES  Aged Out   PAP SMEAR-Modifier  Discontinued    Health Maintenance  Health Maintenance Due  Topic Date Due   Zoster Vaccines- Shingrix (1 of 2) Never done   COVID-19 Vaccine (3 - Mixed Product risk series) 05/03/2020   COLONOSCOPY (Pts 45-41yrs Insurance coverage will need to be confirmed)  01/15/2021   Diabetic kidney evaluation - Urine ACR  02/06/2023   LIPID PANEL  03/26/2023      Mammogram status: Completed 05/08/2022. Repeat every year:2    Lung Cancer Screening: (Low Dose CT Chest recommended if Age 91-80 years, 30 pack-year currently smoking OR have quit w/in 15years.) does not qualify.   Lung Cancer Screening Referral: N/A  Additional Screening:  Hepatitis C Screening: does not qualify; Completed  03/05/2021  Vision Screening: Recommended annual ophthalmology exams for early detection of glaucoma and other disorders of the eye. Is the patient up to date with their annual eye exam?  Yes  Who is the provider or what is the name of the office in which the patient attends annual eye exams? Dr.Groat If pt is not established with a provider, would they like to be referred to a provider to establish care? No .   Dental Screening: Recommended annual dental exams for proper oral hygiene  Community Resource Referral / Chronic Care Management: CRR  required this visit?  No   CCM required this visit?  No      Plan:     I have personally reviewed and noted the following in the patient's chart:   Medical and social history Use of alcohol, tobacco or illicit drugs  Current medications and supplements including opioid prescriptions. Patient is currently taking opioid prescriptions. Information provided to patient regarding non-opioid alternatives. Patient advised to discuss non-opioid treatment plan with their provider. Functional ability and status Nutritional status Physical activity Advanced directives List of other physicians Hospitalizations, surgeries, and ER visits in previous 12 months Vitals Screenings to include cognitive, depression, and falls Referrals and appointments  In addition, I have reviewed and discussed with patient certain preventive protocols, quality metrics, and best practice recommendations. A written personalized care plan for preventive services as well as general preventive health recommendations were provided to patient.     Cala Bradford, [facility]   03/31/2023   Nurse Notes: Face-To-Face Visit  Ms. [name] , Thank you for taking time to come for your Medicare Wellness Visit. I appreciate your ongoing commitment to your health goals. Please review the following plan we discussed and let me know if I can assist you in the future.   These are the goals we discussed:  Goals       " I've had diabetes since 1991 and I see [name] Plyler on a regular basis; she helps me manage my diabetes" (pt-stated)      CARE PLAN ENTRY (see longitudinal plan of care for additional care plan information).  Current Barriers:  Chronic Disease Management support, education, and care coordination needs related to CHF, HTN, COPD, and DMII and rheumatoid arthritis- patient says she is doing OK at present with managing her chronic disease states, says she is receiving percutaneous tibial nerve stimulation every Friday for  overactive bladder with urologist Dr Alvester Morin, she says she can't really tell a difference yet but she just started the therapy   Case Manager Clinical Goal(s):  RNCM will work with patient to improve self management strategies for diabetes in order to meet presurgical A1C goal of <8  Interventions:  Collaborated with BSW to initiate plan of care to address needs related to for  patient with CHF, HTN, COPD, and DMII and rheumatoid arthritis.   Inter-disciplinary care team collaboration (see longitudinal plan of care) Evaluation of current treatment plan related to HTN and IDDM and patient's adherence to plan as established by provider. Reviewed medications with patient and discussed medication taking behavior Discussed plans with patient for ongoing care management follow up and provided patient with direct contact information for care management tea   Patient Self Care Activities:  Patient will make appointments with eye MD, rheumatologist, and optometrist.  Patient will check blood sugar prior to two meals and follow sliding scale Rx to improve blood sugar control and to assist with meeting criteria for Freestyle Li Please see past  updates related to this goal by clicking on the "Past Updates" button in the selected goal        Blood Pressure < 140/90      HEMOGLOBIN A1C < 7.0      Lab Results  Component Value Date   HGBA1C 8.5 (A) 04/12/2020     Reviewed diabetes self management actions reviewed: Glucose monitoring per provider recommendations- reviewed Freestyle Libre qualifying criteria and collaborated with Norm Parcel RD, CDCES to assist patient with obtaining Josephine Igo if she qualifies under the public health emergency guidelines Take medications as directed Eat Healthy- reviewed and provided handout entitled "Planning Healthy Meals"  Check feet daily Visit provider every 3-6 months as directed Hbg A1C level every 3-6 months. Eye Exam yearly- provided patient a list of in network  providers Yearly Foot exam- provided patient a list of in network providers      LDL CALC < 100      Lab Results  Component Value Date   CHOL 139 06/05/2016   HDL 34 (L) 06/05/2016   LDLCALC 77 06/05/2016   TRIG 142 06/05/2016   CHOLHDL 4.1 06/05/2016     Needs update       Prevent Falls      Patient reports no recent falls.      Quit smoking / using tobacco      Patient states "smoking is my only vice and I'm not going to give it up"        This is a list of the screening recommended for you and due dates:  Health Maintenance  Topic Date Due   Zoster (Shingles) Vaccine (1 of 2) Never done   COVID-19 Vaccine (3 - Mixed Product risk series) 05/03/2020   Colon Cancer Screening  01/15/2021   Yearly kidney health urinalysis for diabetes  02/06/2023   Lipid (cholesterol) test  03/26/2023   Hemoglobin A1C  06/30/2023   Flu Shot  07/23/2023   Complete foot exam   12/05/2023   Eye exam for diabetics  01/20/2024   Screening for Lung Cancer  01/27/2024   Yearly kidney function blood test for diabetes  02/16/2024   Medicare Annual Wellness Visit  03/30/2024   Mammogram  05/08/2024   DTaP/Tdap/Td vaccine (3 - Td or Tdap) 03/25/2032   Hepatitis C Screening: USPSTF Recommendation to screen - Ages 50-79 yo.  Completed   HIV Screening  Completed   HPV Vaccine  Aged Out   Pap Smear  Discontinued

## 2023-03-31 NOTE — Progress Notes (Unsigned)
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  She is doing reasonably well, though still hasn't come to terms with her recent amputation.  Foot wound healing, last checked a couple of weeks ago by podiatrist, f/u appt tmro.  Staples out.  Jennifer [name] is changing dressings every other day.  Washing her ACE wraps to reuse.  Reports that she peeled off loose dry skin a couple of days ago.   DM "doing well" - "it takes me two days to drink a large bottle of soda now, before I could do it in an hour or two). Gained a couple of pounds.  N/V - hasn't thrown up in last 2-3 wks- can happen at night, wake her up.  Vomits undigested food.  Suspect gastroparesis  Bowels ok.   Hx of hyperplastic polyp, needs to schedule colonoscopy, re-referred in case it had expired.  Back surgery, goal to walk again once she is fitted with appropriate R shoe and back is less of an issue (entertaining surgery).   Nocturnal incontinence only.  No UI during day.  Purewick availability?  Hasn't received anora ellipta?   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    Objective  BP (!) 140/69 (BP Location: Right Arm, Patient Position: Sitting, Cuff Size: Small)   Pulse 64   Temp 97.8 F (36.6 C) (Oral)   Ht 5\' 5"  (1.651 m)   Wt 222 lb (100.7 kg)   SpO2 95%   BMI 36.94 kg/m   Awake and alert, chipper positive affect, nicely groomed.   Arrives in transport chair; wearing L leg prosthetic.  RLE heavily edematous, with scattered small approx 1 cm discolored lesions (scars) from unknown rash, suspected to be bug bites.  No new lesions now for some time.  R foot unwrapped and examined.  Photos were obtained.  No drainage from healing incision line.  Pale erythema without warmth or tenderness extends to lower anterior leg.  Suboptimatl exam (standing position) - discolored scaling rash proximal medial thighs/bilateral inguinal creases.  Skin dry. No malodor.    Assessment  and plan (problems in no particular order)  4 pharmacies Picks up ozemip soon

## 2023-03-31 NOTE — Progress Notes (Addendum)
Diabetes Self-Management Education  Visit Type: Annual Follow-Up  Appt. Start Time: 11:40 Appt. End Time: 12:15  03/31/2023  [name], identified by name and date of birth, is a 61 y.o. female with a diagnosis of Diabetes:  .   ASSESSMENT  Ms Jennifer [name] forgot her Continuous glucose monitoring reader today. She reports hypoglycemia of 57mg /dL on at least on occasion taking 70 units of [place] daily despite current order for [place] of 20 units daily.  She states she has plenty of [place]. Has not had Ozempic in a while due to lack of transportation. Suggested she decrease her Tresiba to 65 units daily and I would call her if not okay with Dr. Mayford Knife. She requests a new prescription for Freestyle Libre 3 (dispense 6, refill 3) sent to  [facility].   she's cut down her soda consumption to one large bottle every 2 days       Diabetes Self-Management Education - 03/31/23 1400       Visit Information   Visit Type Annual Follow-Up      Health Coping   How would you rate your overall health? Good      Pre-Education Assessment   Patient understands the diabetes disease and treatment process. Demonstrates understanding / competency    Patient understands incorporating nutritional management into lifestyle. Comprehends key points    Patient undertands incorporating physical activity into lifestyle. Demonstrates understanding / competency    Patient understands using medications safely. Demonstrates understanding / competency    Patient understands monitoring blood glucose, interpreting and using results Comprehends key points    Patient understands prevention, detection, and treatment of acute complications. Demonstrates understanding / competency    Patient understands prevention, detection, and treatment of chronic complications. Demonstrates understanding / competency    Patient understands how to develop strategies to address psychosocial  issues. Demonstrates understanding / competency    Patient understands how to develop strategies to promote health/change behavior. Needs Review      Complications   Last HgB A1C per patient/outside source 6.3 %    How often do you check your blood sugar? > 4 times/day    Number of hypoglycemic episodes per month 1    Have you had a dilated eye exam in the past 12 months? Yes    Have you had a dental exam in the past 12 months? --   Adentulous   Are you checking your feet? Yes    How many days per week are you checking your feet? --   NA because of amputation     Activity / Exercise   Activity / Exercise Type ADL's      Patient Education   Previous Diabetes Education Yes (please comment)   Multiple times in our office   Lifestyle and Health Coping Helped patient develop diabetes management plan for (enter comment)   for preventing hypoglycemia     Patient Self-Evaluation of Goals - Patient rates self as meeting previously set goals (% of time)   Nutrition 50 - 75 % (half of the time)    Medications >75% (most of the time)    Monitoring --    Reducing Risk (treating acute and chronic complications) --      Post-Education Assessment   Patient understands using medications safely. Demonstrates understanding / competency    Patient understands monitoring blood glucose, interpreting and using results Demonstrates understanding / competency    Patient understands prevention, detection, and treatment of acute  complications. Demonstrates understanding / competency      Outcomes   Expected Outcomes Demonstrated interest in learning. Expect positive outcomes    Future DMSE 4-6 wks    Program Status Completed      Subsequent Visit   Since your last visit have you continued or begun to take your medications as prescribed? Yes    Since your last visit have you had your blood pressure checked? Yes    Is your most recent blood pressure lower, unchanged, or higher since your last visit? Lower     Since your last visit have you experienced any weight changes? Loss    Weight Loss (lbs) 7    Since your last visit, are you checking your blood glucose at least once a day? Yes             Individualized Plan for Diabetes Self-Management Training:   Learning Objective:  Patient will have a greater understanding of diabetes self-management. Patient education plan is to attend individual and/or group sessions per assessed needs and concerns.   Plan:   There are no Patient Instructions on file for this visit.  Expected Outcomes:  Demonstrated interest in learning. Expect positive outcomes  Education material provided: Diabetes Resources  If problems or questions, patient to contact team via:  Phone  Future DSME appointment: 4-6 wks  Norm Parcel, RD 03/31/2023 3:26 PM.

## 2023-03-31 NOTE — Telephone Encounter (Signed)
Patient requests free style libre 3 sensor prescription be sent to [facility].

## 2023-04-01 ENCOUNTER — Telehealth: Payer: Self-pay | Admitting: Pharmacist

## 2023-04-01 ENCOUNTER — Other Ambulatory Visit (HOSPITAL_COMMUNITY): Payer: Self-pay

## 2023-04-01 ENCOUNTER — Other Ambulatory Visit: Payer: Self-pay | Admitting: Pharmacist

## 2023-04-01 ENCOUNTER — Other Ambulatory Visit: Payer: Self-pay | Admitting: Internal Medicine

## 2023-04-01 ENCOUNTER — Ambulatory Visit (INDEPENDENT_AMBULATORY_CARE_PROVIDER_SITE_OTHER): Payer: 59 | Admitting: Podiatry

## 2023-04-01 ENCOUNTER — Other Ambulatory Visit: Payer: Self-pay

## 2023-04-01 DIAGNOSIS — Z89431 Acquired absence of right foot: Secondary | ICD-10-CM

## 2023-04-01 DIAGNOSIS — R2681 Unsteadiness on feet: Secondary | ICD-10-CM

## 2023-04-01 DIAGNOSIS — E119 Type 2 diabetes mellitus without complications: Secondary | ICD-10-CM

## 2023-04-01 LAB — MICROALBUMIN / CREATININE URINE RATIO
Creatinine, Urine: 106.2 mg/dL
Microalb/Creat Ratio: 47 mg/g creat — ABNORMAL HIGH (ref 0–29)
Microalbumin, Urine: 49.7 ug/mL

## 2023-04-01 LAB — LIPID PANEL
Chol/HDL Ratio: 2.6 ratio (ref 0.0–4.4)
Cholesterol, Total: 115 mg/dL (ref 100–199)
HDL: 45 mg/dL (ref >39)
LDL Chol Calc (NIH): 55 mg/dL (ref 0–99)
Triglycerides: 72 mg/dL (ref 0–149)
VLDL Cholesterol Cal: 15 mg/dL (ref 5–40)

## 2023-04-01 MED ORDER — SPIRONOLACTONE 25 MG PO TABS
25.0000 mg | ORAL_TABLET | Freq: Every day | ORAL | 5 refills | Status: DC
  Filled 2023-04-01 (×2): qty 30, 30d supply, fill #0
  Filled 2023-05-07: qty 30, 30d supply, fill #1
  Filled 2023-06-09 – 2023-06-10 (×2): qty 30, 30d supply, fill #2
  Filled 2023-07-06: qty 30, 30d supply, fill #3

## 2023-04-01 MED ORDER — TRESIBA FLEXTOUCH 200 UNIT/ML ~~LOC~~ SOPN: PEN_INJECTOR | SUBCUTANEOUS | 3 refills | Status: DC

## 2023-04-01 MED ORDER — METFORMIN HCL 500 MG PO TABS: 500.0000 mg | ORAL_TABLET | Freq: Two times a day (BID) | ORAL | 3 refills | Status: DC

## 2023-04-01 NOTE — Assessment & Plan Note (Signed)
Jennifer [name] wasn't aware that there were nicotine replacement patches were prescribed at last visit.  Given 1ppd will order the 21 ug dose. She is motivated.

## 2023-04-01 NOTE — Assessment & Plan Note (Signed)
Nocturnal symptoms are severe, requiring bed and clothing change.  She is awakened by the incontinence. Continues to take myrbetrix and LA oxybutynin which she feels controls her daytime symptoms.  She is miserable and inquires about a home external catheter system.  Possibility she has autonomic neuropathy affecting her bladder (neurogenic) with overflow incontinence occurring at night.  We need expert intervention, but first records from [facility]!

## 2023-04-01 NOTE — Assessment & Plan Note (Signed)
Still hasn't scheduled this due to intervening issues.  I'll place referral again in case it has expired.

## 2023-04-01 NOTE — Assessment & Plan Note (Signed)
Unfortunately has gained a couple of pounds. Adherent to ozempic 1 mg weekly (no plan to increase as she is being evaluated for possible gastroparesis).  Diet is suboptimal.  She is also carrying significant edema weight in R leg.  No changes today.

## 2023-04-01 NOTE — Assessment & Plan Note (Signed)
Heavy 3+ edema, nontender skin and no skin changes of venous insufficiency.  She has been unable to wear compression stocking since her toe wound followed by foot surgery. Tries to keep elevated.  High dose amlodipine could be contributing; if bp response to spiro may be able to lower norvasc dose.

## 2023-04-01 NOTE — Assessment & Plan Note (Signed)
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  Still in healing process.  Had just received new diabetic shoes when her toe osteomyelitis began; she'll need a new R shoe with toe block and I suspect her podiatrist will help with this.

## 2023-04-01 NOTE — Assessment & Plan Note (Signed)
RRR today. Continue to monitor.

## 2023-04-01 NOTE — Assessment & Plan Note (Signed)
F/u interval bmp and urine microalbumin today.

## 2023-04-01 NOTE — Assessment & Plan Note (Addendum)
Chronic intermittent.  Has been on metformin for years and doesn't feel she experiences related SE's.  Does not feel systemically ill during episodes.  Early satiety.  Emesis usually contains undigested or partially digested food, sometimes many hours after meal.  Bowel fxn nml.  No weight loss.  Sxs warrant investigation for gastroparesis, which could derive from either autonomic neuropathy (can't assess orthostatic HR response while on BB) or GLP1i or both.  Options of treating empirically vs proceeding with emptying study discussed.  Will proceed with the study.  If gastroparesis present will need to decrease dose of GLP1 as first step. Her A1c is tightly controlled.

## 2023-04-01 NOTE — Assessment & Plan Note (Addendum)
A1c improved from 8.3 to 6.3 (lowest ever) x 53M with some lows on metformin 500 bid, semaglutide 1 mg weekly and Tresiba 67 u daily.  She will reduce insulin to 65 units.  Urine microalbumin has improved! Still very dependent on snacks and sodas.

## 2023-04-01 NOTE — Assessment & Plan Note (Signed)
Annual lipid panel today; adherent to statin.

## 2023-04-01 NOTE — Assessment & Plan Note (Addendum)
Continues to have severe pain of hands, and L foot and lower leg (phantom pain).  On max pregabalin per renal fxn and duloxetine 20 mg bid as well as long term opioid. Will increase duloxetine to 60 mg bid.

## 2023-04-01 NOTE — Assessment & Plan Note (Signed)
Mood has improved, she is feeling better as she works through her grief, and got through the holiday season.  She is troubled by her multimorbidity and functional limitations, understandably.  We'll continue to support her.  Increase in duloxetine may be beneficial.

## 2023-04-01 NOTE — Assessment & Plan Note (Signed)
Pain is multifactorial and not controlled by her current regimen, though risk outweighs benefit regarding increased dosing of oxycodone.  She does not request early refills and I have not identified red flags.

## 2023-04-01 NOTE — Assessment & Plan Note (Signed)
Continues to heal slowly.  Doing her own dressing changes every other day, washing and drying her ACE wraps.  Has f/u with podiatrist tomorrow.  This has been generally difficult for her to cope with, as her mobility is further limited.  We'll continue to support her.

## 2023-04-01 NOTE — Progress Notes (Signed)
04/01/2023 Name: HETAL ALTENHOFEN MRN: [id number] DOB: 1961/07/23  Chief Complaint  Patient presents with   Medication Management   Diabetes    Jennifer [name] is a 62 y.o. year old female who presented for a telephone visit.   They were referred to the pharmacist by their PCP for assistance in managing medication access.   Subjective:  Care Team: Primary Care Provider: Miguel Aschoff, MD ; Next Scheduled Visit: 05/14/23  Medication Access/Adherence  Current Pharmacy:  [facility], [place] - [address] AT [address] [place] [address] Phone: [phone] Fax: [phone]  [facility] - [place], [place] - [address] [place] [address] Phone: [phone] Fax: [phone]  [facility] [address], [place] - [address] [place] [address] Phone: [phone] Fax: [phone]  [facility] [address] [place] [address] Phone: [phone] Fax: [phone]   Patient reports affordability concerns with their medications: No  Patient reports access/transportation concerns to their pharmacy: Yes  Patient reports adherence concerns with their medications:  Yes    Patient notes that she has a difficult time getting to the pharmacy for refills. She currently receives medications from several different pharmacies. Notes she sometimes gets medications packaged, sometimes Discussed mail order options.   Diabetes:  Current medications: metformin 500 mg twice daily, Ozempic 1 mg weekly, Tresiba 65 units daily  Reports she needs a refill on [place] sensors.   Requests refill on ondansetron today due to periodic nausea. Denies need for senna/docusate today  Hypertension:  Current medications: amlodipine 10 mg daily, metoprolol succinate 100 mg daily; reports today that she has been taking HCTZ 12.5 mg  daily but was started on spironolactone 25 mg at visit yesterday. Medication is ready for pick up  Reports continued edema.   Hyperlipidemia/ASCVD Risk Reduction  Current lipid lowering medications: rosuvastatin 20 mg daily  Chronic Pain and Insomnia Current medications: duloxetine 30 mg twice daily, oxycodone 5 mg PRN; pregabalin 200 mg three times daily; trazodone 200 mg PRN - reports she only uses periodically, has not needed often; baclofen 5 mg twice daily - reports she does take this scheduled  Per PCP documentation, there was discussion of increasing duloxetine dose, but it does not appear this was completed.   OAB:  Current medications: Myrbetriq 50 mg daily, oxybutynin XL 10 mg daily   Objective:  Lab Results  Component Value Date   HGBA1C 6.3 (A) 03/31/2023    Lab Results  Component Value Date   CREATININE 1.02 (H) 02/15/2023   BUN 11 02/15/2023   NA 140 02/15/2023   K 3.6 02/15/2023   CL 104 02/15/2023   CO2 21 (L) 02/15/2023    Lab Results  Component Value Date   CHOL 115 03/31/2023   HDL 45 03/31/2023   LDLCALC 55 03/31/2023   TRIG 72 03/31/2023   CHOLHDL 2.6 03/31/2023    Medications Reviewed Today     Reviewed by Alden Hipp, RPH-CPP (Pharmacist) on 04/01/23 at 1604  Med List Status: <None>   Medication Order Taking? Sig Documenting Provider Last Dose Status Informant  acetaminophen (TYLENOL) 650 MG CR tablet [id number]  Take 2 tablets (1,300 mg total) by mouth every 8 (eight) hours as needed for pain. Mapp, Gaylyn Cheers, MD  Active   albuterol [facility] HFA) 108 (90 Base) MCG/ACT inhaler [id number]  INHALE 2 PUFFS BY MOUTH  EVERY 6 HOURS AS NEEDED FOR WHEEZING  Patient taking differently: Inhale 1 puff into the lungs every 6 (six) hours as needed for shortness of breath.   Miguel Aschoff, MD  Active Self  amLODipine (NORVASC) 10 MG tablet [id number] Yes Take 1 tablet (10 mg total) by mouth daily. Miguel Aschoff, MD Taking Active Self   aspirin EC 81 MG tablet [id number]  Take 1 tablet (81 mg total) by mouth daily. Miguel Aschoff, MD  Active Self  baclofen (LIORESAL) 10 MG tablet [id number] Yes Take 0.5 tablets (5 mg total) by mouth 2 (two) times daily. Miguel Aschoff, MD Taking Active   camphor-menthol [facility]) lotion [id number]  Apply topically as needed for itching. Mapp, Gaylyn Cheers, MD  Active   Continuous Blood Gluc Receiver (FREESTYLE LIBRE 3 READER) DEVI [id number] Yes 1 each by Does not apply route continuous. Use to continuously monitor blood glucose Mayford Knife Dorene Ar, MD Taking Active Self  Continuous Blood Gluc Sensor (FREESTYLE LIBRE 3 SENSOR) [place] [id number] Yes Place 1 sensor on the skin every 14 days. Use to check glucose continuously Miguel Aschoff, MD Taking Active   DULoxetine (CYMBALTA) 30 MG capsule [id number] Yes Take 30 mg by mouth 2 (two) times daily. [provider] Taking Active   glucose blood test strip [id number]  Use 3 (three) times daily. Mapp, Gaylyn Cheers, MD  Active   insulin degludec (TRESIBA FLEXTOUCH) 200 UNIT/ML FlexTouch Pen [id number] Yes INJECT 65 UNITS UNDER THE SKIN DAILY Miguel Aschoff, MD Taking Active   Insulin Pen Needle 32G X 4 MM MISC [id number]  Use to inject insulin 4 times a day and semaglutide once weekly Miguel Aschoff, MD  Active Self  Lancets MISC [id number]  Use as directed Miguel Aschoff, MD  Active Self  metFORMIN (GLUCOPHAGE) 500 MG tablet [id number] Yes Take 1 tablet (500 mg total) by mouth 2 (two) times daily. Miguel Aschoff, MD Taking Active   metoprolol succinate (TOPROL-XL) 100 MG 24 hr tablet [id number] Yes Take 1 tablet (100 mg total) by mouth daily. TAKE WITH OR IMMEDIATELY FOLLOWING A MEAL. Miguel Aschoff, MD Taking Active Self  Multiple Vitamin (MULTIVITAMIN) tablet [id number]  Take 1 tablet by mouth daily. [provider]  Active Self  MYRBETRIQ 50 MG TB24 tablet [id number] Yes Take 1 tablet (50 mg total) by mouth daily.  Miguel Aschoff, MD Taking Active Self  nicotine (NICODERM CQ - DOSED IN MG/24 HOURS) 21 mg/24hr patch [id number]  Place 1 patch (21 mg total) onto the skin daily for 14 days. Miguel Aschoff, MD  Active   nystatin cream Amb[facility] Pancoast) [id number]  Apply 1 Application topically 2 (two) times daily to clean dry skin Miguel Aschoff, MD  Active   ondansetron (ZOFRAN-ODT) 4 MG disintegrating tablet [id number] Yes Take 1 tablet (4 mg total) by mouth every 8 (eight) hours as needed for nausea or vomiting. Miguel Aschoff, MD Taking Active   OVER THE COUNTER MEDICATION [id number]  Take 1 tablet by mouth daily. [facility] [employer] otc supplement [provider]  Active Self  oxybutynin (DITROPAN-XL) 10 MG 24 hr tablet [id number] Yes Take 10 mg by mouth daily. [provider] Taking Active Self  oxyCODONE (OXY IR/ROXICODONE) 5 MG immediate release tablet [id number] Yes Take 1 tablet (5 mg total) by mouth every 4 (four) hours as needed for severe pain. Mapp, Gaylyn Cheers, MD Taking Active   pantoprazole (PROTONIX) 40 MG tablet [id number] Yes Take 1 tablet (40 mg total) by mouth  daily. Miguel AschoffWilliams, Julie Anne, MD Taking Active   polyethylene glycol powder [facility](GLYCOLAX/MIRALAX) 17 GM/SCOOP powder [id number]  Take 17 g by mouth daily as needed for mild constipation. Karoline CaldwellMapp, Tavien, MD  Active     Discontinued 01/23/14 1328 (Reorder)   povidone-iodine (BETADINE) 10 % external solution [id number]  Apply 1 Application topically as needed for wound care. Louann SjogrenSikora, Rebecca, DPM  Active Self  pregabalin (LYRICA) 200 MG capsule [id number] Yes TAKE 1 CAPSULE BY MOUTH THREE TIMES A DAY Miguel AschoffWilliams, Julie Anne, MD Taking Active Self  rosuvastatin (CRESTOR) 20 MG tablet [id number] Yes Take 1 tablet (20 mg total) by mouth in the morning. Miguel AschoffWilliams, Julie Anne, MD Taking Active Self  Semaglutide, 1 MG/DOSE, (OZEMPIC, 1 MG/DOSE,) 4 MG/3ML SOPN [id number] Yes Inject 1 mg into the skin once a week. Miguel AschoffWilliams, Julie Anne, MD Taking  Active Self           Med Note Clearance Coots(Vir Whetstine, Mavi Un T   Wed Apr 01, 2023  3:48 PM)    senna-docusate (SENOKOT-S) 8.6-50 MG tablet [id number] No Take 1 tablet by mouth at bedtime.  Patient not taking: Reported on 04/01/2023   Karoline CaldwellMapp, Tavien, MD Not Taking Active   spironolactone (ALDACTONE) 25 MG tablet [id number] No Take 1 tablet (25 mg total) by mouth daily.  Patient not taking: Reported on 04/01/2023   Miguel AschoffWilliams, Julie Anne, MD Not Taking Active   traZODone (DESYREL) 100 MG tablet [id number]  Take 2 tablets (200 mg total) by mouth at bedtime as needed for sleep. Miguel AschoffWilliams, Julie Anne, MD  Active Self              Assessment/Plan:   Medication Management: - Unclear system of organizing and obtaining refills. Discussed consolidating refills to [facility] for mail order and adherence packaging  Patient notes that she has a difficult time getting to the pharmacy for refills. She currently receives medications from several different pharmacies. Notes she sometimes gets medications packaged, sometimes Discussed mail order options. She requests to have refills sent to [facility] for mail order and eventual adherence packaging. Will work with PCP to place refills on all medications. - [facility]ed [facility], and [facility]. Requested to cancel all refills at this time.   Diabetes: - Controlled, recent insulin dose reduction due to hypoglycemia. Needs order of Ozempic and Libre sensors today - Collaborated with pharmacy to set up mail order. Patient will call pharmacy to provide card information. They will mail out Ozempic and Libre sensors tomorrow - Given elevated UACR (though improved), consider low dose SGLT2 moving forward. Given tighter glycemic control at this time, lower risk of glucosuria/urinary tract infections.   Hypertension: - Uncontrolled per office visit yesterday.  - Advised patient that Dr. Mayford KnifeWilliams was unaware she is on HCTZ. Stop HCTZ, start spironolactone  when it arrives from Pharmacy. Will discuss any timing for follow up BMP with PCP.  - Will discuss home BP monitoring moving forward.   Hyperlipidemia/ASCVD Risk Reduction - Controlled to goal <70 - Recommend to continue current regimen at this time  Chronic Pain and Insomnia - Uncontrolled per patient report; will collaborate with PCP to review plan regarding duloxetine - Continue regimen at this time. Oxycodone will not be mailed so [facility]  OAB:  - Controlled per patient report  - Recommend to continue current regimen at this time  Follow Up Plan: will collaborate with PCP as above and follow up with patient next week   Catie T.  Clearance Coots, PharmD, [place], CPP [facility] [phone]

## 2023-04-01 NOTE — Progress Notes (Signed)
Contacted patient regarding preferred pharmacy and medication access as requested by Dr. Mayford Knife. Left voicemail for patient to return my call at her convenience.   Catie Eppie Gibson, PharmD, BCACP, CPP [facility] [phone]

## 2023-04-01 NOTE — Assessment & Plan Note (Signed)
Recurrence of problem, not responding to nystatin powder.  Will replace with cream preparation.  Nocturnal moisture-associated skin irritation is also a problem.

## 2023-04-01 NOTE — Assessment & Plan Note (Signed)
140/69 (unfortunately not rechecked).  Bps persistently above goal 130 on amlodipine 10 and metoprolol 100.  Angioedema with ACEI, intolerant of diuretics due to severe UI.  Spironolactone or hydralazine are options - favor once daily drug given her existing polypharmacy.  Begin spiro 25 mg daily.

## 2023-04-01 NOTE — Patient Instructions (Signed)
Ms. Maisano,   Call the [facility] at [facility] to put card information on file. I'll work with Dr. Mayford Knife to send refills on everything to our [facility] to fill, mail, and eventually adherence pack for you.   Call me with any questions or concerns.   Thanks!  Catie Eppie Gibson, PharmD, BCACP, CPP [facility] [phone]

## 2023-04-01 NOTE — Assessment & Plan Note (Signed)
Asymptomatic scars remain from prior lesions, none new.  Monitor for recurrence.

## 2023-04-02 ENCOUNTER — Other Ambulatory Visit: Payer: Self-pay

## 2023-04-02 ENCOUNTER — Other Ambulatory Visit (HOSPITAL_COMMUNITY): Payer: Self-pay

## 2023-04-02 ENCOUNTER — Other Ambulatory Visit: Payer: Self-pay | Admitting: Internal Medicine

## 2023-04-02 DIAGNOSIS — Z794 Long term (current) use of insulin: Secondary | ICD-10-CM

## 2023-04-02 DIAGNOSIS — J449 Chronic obstructive pulmonary disease, unspecified: Secondary | ICD-10-CM

## 2023-04-02 MED ORDER — FREESTYLE LIBRE 3 READER DEVI
1.0000 | 1 refills | Status: DC
  Filled 2023-04-02 – 2023-04-08 (×2): qty 1, 30d supply, fill #0
  Filled 2023-04-08 (×2): qty 1, 90d supply, fill #0

## 2023-04-02 MED ORDER — UMECLIDINIUM-VILANTEROL 62.5-25 MCG/ACT IN AEPB
1.0000 | INHALATION_SPRAY | Freq: Every morning | RESPIRATORY_TRACT | 6 refills | Status: DC
  Filled 2023-04-02: qty 60, 30d supply, fill #0

## 2023-04-02 NOTE — Progress Notes (Signed)
Subjective: No chief complaint on file.     Jennifer [name] is a 63 y.o. is seen today in office s/p right transmetatarsal amputation preformed on 02/12/2023.  She presents today for evaluation.  She has been doing well.  She states that while in the office today she notes a scab that she picked off and cause of the bleeding.  No fevers or chills.   Objective: General: No acute distress, AAOx3 -son is present, in wheelchair DP/PT pulses palpable , CRT < 3 sec to all digits.  Right foot: Incision is well coapted without any evidence of dehiscence.  There was an area along the distal medial aspect but it appears to be a scab.  While in the office she picked this off and there was some bleeding.  There is no probing, minimally.  No surrounding erythema, ascending cellulitis.  No fluctuance or crepitation there is no malodor.  No pain with calf compression, swelling, warmth, erythema.   Assessment and Plan:  Status post right foot transmetatarsal amputation, localized erythema  -Treatment options discussed including all alternatives, risks, and complications -Incision peers to be healing well.  Discussed with her not to pick the scab and come off on its own.  Continue with daily dressing changes.  Recommend small amount of Betadine to the area.  Gets dried and she can use a small amount of antibiotic ointment. -Will start physical therapy to tarsal weightbearing to full weightbearing in cam boot as tolerated as long as incisions healing and strength improves. -Monitor for any clinical signs or symptoms of infection and directed to call the office immediately should any occur or go to the ER.  Follow-up as scheduled or sooner if needed.  [name] DPM

## 2023-04-03 ENCOUNTER — Other Ambulatory Visit (HOSPITAL_COMMUNITY): Payer: Self-pay

## 2023-04-03 ENCOUNTER — Other Ambulatory Visit: Payer: Self-pay

## 2023-04-06 ENCOUNTER — Other Ambulatory Visit (HOSPITAL_COMMUNITY): Payer: Self-pay

## 2023-04-06 ENCOUNTER — Other Ambulatory Visit: Payer: Self-pay | Admitting: Internal Medicine

## 2023-04-06 ENCOUNTER — Other Ambulatory Visit: Payer: 59 | Admitting: Pharmacist

## 2023-04-06 DIAGNOSIS — G8929 Other chronic pain: Secondary | ICD-10-CM

## 2023-04-06 DIAGNOSIS — E1159 Type 2 diabetes mellitus with other circulatory complications: Secondary | ICD-10-CM

## 2023-04-06 DIAGNOSIS — K219 Gastro-esophageal reflux disease without esophagitis: Secondary | ICD-10-CM

## 2023-04-06 DIAGNOSIS — E1169 Type 2 diabetes mellitus with other specified complication: Secondary | ICD-10-CM

## 2023-04-06 DIAGNOSIS — E1121 Type 2 diabetes mellitus with diabetic nephropathy: Secondary | ICD-10-CM

## 2023-04-06 DIAGNOSIS — J449 Chronic obstructive pulmonary disease, unspecified: Secondary | ICD-10-CM

## 2023-04-06 DIAGNOSIS — E1122 Type 2 diabetes mellitus with diabetic chronic kidney disease: Secondary | ICD-10-CM

## 2023-04-06 DIAGNOSIS — E1142 Type 2 diabetes mellitus with diabetic polyneuropathy: Secondary | ICD-10-CM

## 2023-04-06 DIAGNOSIS — E119 Type 2 diabetes mellitus without complications: Secondary | ICD-10-CM

## 2023-04-06 DIAGNOSIS — N3946 Mixed incontinence: Secondary | ICD-10-CM

## 2023-04-06 MED ORDER — OXYBUTYNIN CHLORIDE ER 10 MG PO TB24
10.0000 mg | ORAL_TABLET | Freq: Every day | ORAL | 1 refills | Status: DC
  Filled 2023-04-06 – 2023-04-07 (×3): qty 90, 90d supply, fill #0
  Filled 2023-05-07 – 2023-05-15 (×4): qty 30, 30d supply, fill #0
  Filled 2023-06-09 – 2023-06-10 (×2): qty 30, 30d supply, fill #1
  Filled 2023-07-06 – 2023-07-07 (×3): qty 30, 30d supply, fill #2
  Filled 2023-08-06 – 2023-08-10 (×2): qty 30, 30d supply, fill #3
  Filled 2023-09-04: qty 30, 30d supply, fill #4
  Filled 2023-10-01: qty 30, 30d supply, fill #5

## 2023-04-06 MED ORDER — TRESIBA FLEXTOUCH 200 UNIT/ML ~~LOC~~ SOPN
65.0000 [IU] | PEN_INJECTOR | Freq: Every day | SUBCUTANEOUS | 3 refills | Status: DC
Start: 2023-04-06 — End: 2023-07-15
  Filled 2023-04-06: qty 9, 28d supply, fill #0
  Filled 2023-04-06: qty 9, 27d supply, fill #0
  Filled 2023-05-07: qty 9, 27d supply, fill #1
  Filled 2023-07-06: qty 9, 27d supply, fill #2

## 2023-04-06 MED ORDER — PANTOPRAZOLE SODIUM 40 MG PO TBEC
40.0000 mg | DELAYED_RELEASE_TABLET | Freq: Every day | ORAL | 1 refills | Status: DC
Start: 2023-04-06 — End: 2023-07-08
  Filled 2023-04-06: qty 30, 30d supply, fill #0
  Filled 2023-04-06: qty 90, 90d supply, fill #0
  Filled 2023-05-07 – 2023-05-15 (×4): qty 30, 30d supply, fill #0
  Filled 2023-06-09 – 2023-06-10 (×2): qty 30, 30d supply, fill #1
  Filled 2023-07-06 – 2023-07-07 (×3): qty 30, 30d supply, fill #2

## 2023-04-06 MED ORDER — PREGABALIN 200 MG PO CAPS
200.0000 mg | ORAL_CAPSULE | Freq: Three times a day (TID) | ORAL | 1 refills | Status: DC
Start: 2023-04-06 — End: 2023-11-02
  Filled 2023-04-06 – 2023-05-13 (×3): qty 270, 90d supply, fill #0
  Filled 2023-11-04: qty 270, 90d supply, fill #1

## 2023-04-06 MED ORDER — PREGABALIN 200 MG PO CAPS
200.0000 mg | ORAL_CAPSULE | Freq: Three times a day (TID) | ORAL | 1 refills | Status: DC
Start: 2023-04-06 — End: 2023-04-06

## 2023-04-06 MED ORDER — METOPROLOL SUCCINATE ER 100 MG PO TB24
100.0000 mg | ORAL_TABLET | Freq: Every day | ORAL | 3 refills | Status: DC
Start: 2023-04-06 — End: 2024-02-18
  Filled 2023-04-06 – 2023-04-07 (×3): qty 90, 90d supply, fill #0
  Filled 2023-05-07 – 2023-05-15 (×4): qty 30, 30d supply, fill #0
  Filled 2023-06-09 – 2023-06-10 (×2): qty 30, 30d supply, fill #1
  Filled 2023-07-06 – 2023-07-07 (×3): qty 30, 30d supply, fill #2
  Filled 2023-08-06 – 2023-08-10 (×2): qty 30, 30d supply, fill #3
  Filled 2023-09-04: qty 30, 30d supply, fill #4
  Filled 2023-10-01: qty 30, 30d supply, fill #5
  Filled 2023-11-02 – 2023-11-04 (×2): qty 30, 30d supply, fill #6
  Filled 2023-11-30 – 2023-12-01 (×2): qty 30, 30d supply, fill #7
  Filled 2023-12-15 – 2023-12-24 (×3): qty 30, 30d supply, fill #8
  Filled 2024-01-15 – 2024-01-19 (×2): qty 30, 30d supply, fill #9
  Filled 2024-02-04 – 2024-02-16 (×3): qty 30, 30d supply, fill #10

## 2023-04-06 MED ORDER — DULOXETINE HCL 30 MG PO CPEP
30.0000 mg | ORAL_CAPSULE | Freq: Two times a day (BID) | ORAL | 1 refills | Status: DC
  Filled 2023-04-06: qty 50, 25d supply, fill #0
  Filled 2023-04-06: qty 180, 90d supply, fill #0
  Filled 2023-04-07: qty 50, 25d supply, fill #0
  Filled 2023-05-07 – 2023-05-15 (×4): qty 60, 30d supply, fill #1
  Filled 2023-06-09 – 2023-06-10 (×2): qty 60, 30d supply, fill #2
  Filled 2023-07-06 – 2023-07-07 (×3): qty 60, 30d supply, fill #3
  Filled 2023-08-06 – 2023-08-10 (×3): qty 60, 30d supply, fill #4
  Filled 2023-09-04: qty 60, 30d supply, fill #5

## 2023-04-06 MED ORDER — ROSUVASTATIN CALCIUM 20 MG PO TABS
20.0000 mg | ORAL_TABLET | Freq: Every morning | ORAL | 3 refills | Status: DC
Start: 2023-04-06 — End: 2024-02-18
  Filled 2023-04-06 – 2023-04-07 (×3): qty 90, 90d supply, fill #0
  Filled 2023-05-07 – 2023-05-15 (×4): qty 30, 30d supply, fill #0
  Filled 2023-06-09 – 2023-06-10 (×2): qty 30, 30d supply, fill #1
  Filled 2023-07-06 – 2023-07-07 (×3): qty 30, 30d supply, fill #2
  Filled 2023-08-06 – 2023-08-10 (×2): qty 30, 30d supply, fill #3
  Filled 2023-09-04: qty 30, 30d supply, fill #4
  Filled 2023-10-01: qty 30, 30d supply, fill #5
  Filled 2023-11-02 – 2023-11-04 (×2): qty 30, 30d supply, fill #6
  Filled 2023-11-30 – 2023-12-01 (×2): qty 30, 30d supply, fill #7
  Filled 2023-12-15 – 2023-12-24 (×3): qty 30, 30d supply, fill #8
  Filled 2024-01-15 – 2024-01-19 (×2): qty 30, 30d supply, fill #9
  Filled 2024-02-04 – 2024-02-16 (×3): qty 30, 30d supply, fill #10

## 2023-04-06 MED ORDER — MYRBETRIQ 50 MG PO TB24
50.0000 mg | ORAL_TABLET | Freq: Every day | ORAL | 3 refills | Status: DC
Start: 2023-04-06 — End: 2024-02-18
  Filled 2023-04-06 – 2023-04-07 (×3): qty 90, 90d supply, fill #0
  Filled 2023-05-07 – 2023-05-15 (×4): qty 30, 30d supply, fill #0
  Filled 2023-06-09 – 2023-06-10 (×2): qty 30, 30d supply, fill #1
  Filled 2023-07-06 – 2023-07-07 (×3): qty 30, 30d supply, fill #2
  Filled 2023-08-06 – 2023-08-10 (×2): qty 30, 30d supply, fill #3
  Filled 2023-09-04: qty 30, 30d supply, fill #4
  Filled 2023-10-01: qty 30, 30d supply, fill #5
  Filled 2023-11-02 – 2023-11-04 (×2): qty 30, 30d supply, fill #6
  Filled 2023-11-30 – 2023-12-01 (×2): qty 30, 30d supply, fill #7
  Filled 2023-12-15 – 2023-12-24 (×3): qty 30, 30d supply, fill #8
  Filled 2024-01-15 – 2024-01-19 (×2): qty 30, 30d supply, fill #9
  Filled 2024-02-04 – 2024-02-16 (×3): qty 30, 30d supply, fill #10

## 2023-04-06 MED ORDER — METFORMIN HCL 500 MG PO TABS
500.0000 mg | ORAL_TABLET | Freq: Two times a day (BID) | ORAL | 3 refills | Status: DC
Start: 2023-04-06 — End: 2023-07-14
  Filled 2023-04-06: qty 180, 90d supply, fill #0
  Filled 2023-04-06 – 2023-05-11 (×3): qty 60, 30d supply, fill #0
  Filled 2023-05-11: qty 180, 90d supply, fill #0
  Filled 2023-05-15 (×3): qty 60, 30d supply, fill #0
  Filled 2023-06-09 – 2023-06-10 (×2): qty 60, 30d supply, fill #1
  Filled 2023-07-06 – 2023-07-07 (×3): qty 60, 30d supply, fill #2

## 2023-04-06 MED ORDER — UMECLIDINIUM-VILANTEROL 62.5-25 MCG/ACT IN AEPB
1.0000 | INHALATION_SPRAY | Freq: Every morning | RESPIRATORY_TRACT | 1 refills | Status: DC
Start: 2023-04-06 — End: 2023-06-11
  Filled 2023-04-06 (×2): qty 180, 180d supply, fill #0
  Filled 2023-05-07: qty 60, 30d supply, fill #0
  Filled 2023-06-10: qty 60, 60d supply, fill #1
  Filled ????-??-??: fill #1

## 2023-04-06 MED ORDER — OXYCODONE HCL 5 MG PO TABS
5.0000 mg | ORAL_TABLET | ORAL | 0 refills | Status: DC | PRN
Start: 2023-04-06 — End: 2023-05-07

## 2023-04-06 MED ORDER — INSULIN PEN NEEDLE 32G X 4 MM MISC
Freq: Four times a day (QID) | 10 refills | Status: DC
Start: 2023-04-06 — End: 2024-02-18
  Filled 2023-04-06 (×2): qty 100, 25d supply, fill #0

## 2023-04-06 NOTE — Progress Notes (Signed)
Care Coordination Call  Contacted patient. She confirms she received medications from [facility] in the mail. Reviewed to stop HCTZ, start spironolactone.   Discussed with Dr. Mayford Knife, orders for refills placed for co-sign. Will collaborate with Dr.Williams to send pregabalin to [facility]; patient notes [facility]   Will collaborate with pharmacy team on adherence packaging.   Catie Eppie Gibson, PharmD, BCACP, CPP [facility] [phone]

## 2023-04-07 ENCOUNTER — Other Ambulatory Visit: Payer: Self-pay

## 2023-04-07 ENCOUNTER — Other Ambulatory Visit (HOSPITAL_BASED_OUTPATIENT_CLINIC_OR_DEPARTMENT_OTHER): Payer: Self-pay

## 2023-04-07 ENCOUNTER — Other Ambulatory Visit (HOSPITAL_COMMUNITY): Payer: Self-pay

## 2023-04-08 ENCOUNTER — Telehealth: Payer: Self-pay

## 2023-04-08 ENCOUNTER — Other Ambulatory Visit (HOSPITAL_COMMUNITY): Payer: Self-pay

## 2023-04-08 NOTE — Telephone Encounter (Signed)
UPDATE:    [employer] is reviewing your PA request. Typically an electronic response will be received within 24-72 hours.  

## 2023-04-08 NOTE — Telephone Encounter (Signed)
Pa for pt ( FREE STYLE LIBRE  3 READER )  came through on cover my meds .Marland Kitchen Was submitted with last office notes and labs .Marland Kitchen Awaiting approval or denial

## 2023-04-08 NOTE — Telephone Encounter (Signed)
DECISION :     [id number] case has been cancelled for FREESTY LIBR MIS 3 READER, use as directed, for the following reason: Prior authorization is not required at this time.  Please note: This medication is covered under your Part B benefit.         ( COPY SENT TO PHARMACY AND ALSO PLACED TO SCAN TO CHART )

## 2023-04-08 NOTE — Telephone Encounter (Signed)
Pa for pt (  OXYCODONE hcl  ) came through on cover my meds .Marland Kitchen Was submitted with last office notes  .Marland Kitchen Awaiting approval or denial ..       DECISION :    Approved today   Your PA request has been approved.   Additional information will be provided in the approval communication. (Message 1145)   Authorization Expiration Date: 04/07/2024   Drug oxyCODONE HCl  tablets   ePA cloud Psychologist, educational Electronic PA Form [phone] [employer])       ( COPY SENT TO PHARMACY AND ALSO PLACED TO SCAN TO CHART )

## 2023-04-09 ENCOUNTER — Encounter: Payer: Self-pay | Admitting: Internal Medicine

## 2023-04-09 DIAGNOSIS — Z7982 Long term (current) use of aspirin: Secondary | ICD-10-CM | POA: Diagnosis not present

## 2023-04-09 DIAGNOSIS — Z89431 Acquired absence of right foot: Secondary | ICD-10-CM | POA: Diagnosis not present

## 2023-04-09 DIAGNOSIS — M5416 Radiculopathy, lumbar region: Secondary | ICD-10-CM | POA: Diagnosis not present

## 2023-04-09 DIAGNOSIS — G8929 Other chronic pain: Secondary | ICD-10-CM | POA: Diagnosis not present

## 2023-04-09 DIAGNOSIS — N182 Chronic kidney disease, stage 2 (mild): Secondary | ICD-10-CM | POA: Diagnosis not present

## 2023-04-09 DIAGNOSIS — E1151 Type 2 diabetes mellitus with diabetic peripheral angiopathy without gangrene: Secondary | ICD-10-CM | POA: Diagnosis not present

## 2023-04-09 DIAGNOSIS — E1142 Type 2 diabetes mellitus with diabetic polyneuropathy: Secondary | ICD-10-CM | POA: Diagnosis not present

## 2023-04-09 DIAGNOSIS — I5030 Unspecified diastolic (congestive) heart failure: Secondary | ICD-10-CM | POA: Diagnosis not present

## 2023-04-09 DIAGNOSIS — I152 Hypertension secondary to endocrine disorders: Secondary | ICD-10-CM | POA: Diagnosis not present

## 2023-04-09 DIAGNOSIS — Z794 Long term (current) use of insulin: Secondary | ICD-10-CM | POA: Diagnosis not present

## 2023-04-09 DIAGNOSIS — E785 Hyperlipidemia, unspecified: Secondary | ICD-10-CM | POA: Diagnosis not present

## 2023-04-09 DIAGNOSIS — E1169 Type 2 diabetes mellitus with other specified complication: Secondary | ICD-10-CM | POA: Diagnosis not present

## 2023-04-09 DIAGNOSIS — E1122 Type 2 diabetes mellitus with diabetic chronic kidney disease: Secondary | ICD-10-CM | POA: Diagnosis not present

## 2023-04-09 DIAGNOSIS — Z89612 Acquired absence of left leg above knee: Secondary | ICD-10-CM | POA: Diagnosis not present

## 2023-04-09 DIAGNOSIS — Z7984 Long term (current) use of oral hypoglycemic drugs: Secondary | ICD-10-CM | POA: Diagnosis not present

## 2023-04-09 DIAGNOSIS — Z4781 Encounter for orthopedic aftercare following surgical amputation: Secondary | ICD-10-CM | POA: Diagnosis not present

## 2023-04-09 DIAGNOSIS — Z9181 History of falling: Secondary | ICD-10-CM | POA: Diagnosis not present

## 2023-04-09 DIAGNOSIS — E1159 Type 2 diabetes mellitus with other circulatory complications: Secondary | ICD-10-CM | POA: Diagnosis not present

## 2023-04-09 DIAGNOSIS — Z7985 Long-term (current) use of injectable non-insulin antidiabetic drugs: Secondary | ICD-10-CM | POA: Diagnosis not present

## 2023-04-14 DIAGNOSIS — T8189XA Other complications of procedures, not elsewhere classified, initial encounter: Secondary | ICD-10-CM | POA: Diagnosis not present

## 2023-04-15 ENCOUNTER — Ambulatory Visit: Payer: 59 | Attending: Podiatry

## 2023-04-15 DIAGNOSIS — Z7982 Long term (current) use of aspirin: Secondary | ICD-10-CM | POA: Diagnosis not present

## 2023-04-15 DIAGNOSIS — Z4781 Encounter for orthopedic aftercare following surgical amputation: Secondary | ICD-10-CM | POA: Diagnosis not present

## 2023-04-15 DIAGNOSIS — N182 Chronic kidney disease, stage 2 (mild): Secondary | ICD-10-CM | POA: Diagnosis not present

## 2023-04-15 DIAGNOSIS — E1151 Type 2 diabetes mellitus with diabetic peripheral angiopathy without gangrene: Secondary | ICD-10-CM | POA: Diagnosis not present

## 2023-04-15 DIAGNOSIS — M5416 Radiculopathy, lumbar region: Secondary | ICD-10-CM | POA: Diagnosis not present

## 2023-04-15 DIAGNOSIS — Z794 Long term (current) use of insulin: Secondary | ICD-10-CM | POA: Diagnosis not present

## 2023-04-15 DIAGNOSIS — E785 Hyperlipidemia, unspecified: Secondary | ICD-10-CM | POA: Diagnosis not present

## 2023-04-15 DIAGNOSIS — Z9181 History of falling: Secondary | ICD-10-CM | POA: Diagnosis not present

## 2023-04-15 DIAGNOSIS — E1159 Type 2 diabetes mellitus with other circulatory complications: Secondary | ICD-10-CM | POA: Diagnosis not present

## 2023-04-15 DIAGNOSIS — I5030 Unspecified diastolic (congestive) heart failure: Secondary | ICD-10-CM | POA: Diagnosis not present

## 2023-04-15 DIAGNOSIS — Z7985 Long-term (current) use of injectable non-insulin antidiabetic drugs: Secondary | ICD-10-CM | POA: Diagnosis not present

## 2023-04-15 DIAGNOSIS — Z89612 Acquired absence of left leg above knee: Secondary | ICD-10-CM | POA: Diagnosis not present

## 2023-04-15 DIAGNOSIS — Z7984 Long term (current) use of oral hypoglycemic drugs: Secondary | ICD-10-CM | POA: Diagnosis not present

## 2023-04-15 DIAGNOSIS — Z89431 Acquired absence of right foot: Secondary | ICD-10-CM | POA: Diagnosis not present

## 2023-04-15 DIAGNOSIS — E1169 Type 2 diabetes mellitus with other specified complication: Secondary | ICD-10-CM | POA: Diagnosis not present

## 2023-04-15 DIAGNOSIS — G8929 Other chronic pain: Secondary | ICD-10-CM | POA: Diagnosis not present

## 2023-04-15 DIAGNOSIS — E1122 Type 2 diabetes mellitus with diabetic chronic kidney disease: Secondary | ICD-10-CM | POA: Diagnosis not present

## 2023-04-15 DIAGNOSIS — E1142 Type 2 diabetes mellitus with diabetic polyneuropathy: Secondary | ICD-10-CM | POA: Diagnosis not present

## 2023-04-15 DIAGNOSIS — I152 Hypertension secondary to endocrine disorders: Secondary | ICD-10-CM | POA: Diagnosis not present

## 2023-04-17 ENCOUNTER — Telehealth: Payer: Self-pay | Admitting: *Deleted

## 2023-04-17 NOTE — Telephone Encounter (Signed)
Patient called in stating she has had no relief from painful thigh rash after using entire tube of nystatin cream. States rash is in thigh creases and is irritated by her underwear. Also, thinks there are "boils" in thigh creases. She requested morning appt on 5/2 when her son has his appt. Appt given with Yellow Team Provider on 5/2 at 1045.

## 2023-04-21 ENCOUNTER — Ambulatory Visit: Payer: 59 | Admitting: Podiatry

## 2023-04-21 DIAGNOSIS — I152 Hypertension secondary to endocrine disorders: Secondary | ICD-10-CM | POA: Diagnosis not present

## 2023-04-21 DIAGNOSIS — E785 Hyperlipidemia, unspecified: Secondary | ICD-10-CM | POA: Diagnosis not present

## 2023-04-21 DIAGNOSIS — I5030 Unspecified diastolic (congestive) heart failure: Secondary | ICD-10-CM | POA: Diagnosis not present

## 2023-04-21 DIAGNOSIS — Z7982 Long term (current) use of aspirin: Secondary | ICD-10-CM | POA: Diagnosis not present

## 2023-04-21 DIAGNOSIS — E1169 Type 2 diabetes mellitus with other specified complication: Secondary | ICD-10-CM | POA: Diagnosis not present

## 2023-04-21 DIAGNOSIS — E1151 Type 2 diabetes mellitus with diabetic peripheral angiopathy without gangrene: Secondary | ICD-10-CM | POA: Diagnosis not present

## 2023-04-21 DIAGNOSIS — E1159 Type 2 diabetes mellitus with other circulatory complications: Secondary | ICD-10-CM | POA: Diagnosis not present

## 2023-04-21 DIAGNOSIS — M5416 Radiculopathy, lumbar region: Secondary | ICD-10-CM | POA: Diagnosis not present

## 2023-04-21 DIAGNOSIS — Z794 Long term (current) use of insulin: Secondary | ICD-10-CM | POA: Diagnosis not present

## 2023-04-21 DIAGNOSIS — Z7985 Long-term (current) use of injectable non-insulin antidiabetic drugs: Secondary | ICD-10-CM | POA: Diagnosis not present

## 2023-04-21 DIAGNOSIS — N182 Chronic kidney disease, stage 2 (mild): Secondary | ICD-10-CM | POA: Diagnosis not present

## 2023-04-21 DIAGNOSIS — Z89612 Acquired absence of left leg above knee: Secondary | ICD-10-CM | POA: Diagnosis not present

## 2023-04-21 DIAGNOSIS — Z9181 History of falling: Secondary | ICD-10-CM | POA: Diagnosis not present

## 2023-04-21 DIAGNOSIS — E1142 Type 2 diabetes mellitus with diabetic polyneuropathy: Secondary | ICD-10-CM | POA: Diagnosis not present

## 2023-04-21 DIAGNOSIS — E1122 Type 2 diabetes mellitus with diabetic chronic kidney disease: Secondary | ICD-10-CM | POA: Diagnosis not present

## 2023-04-21 DIAGNOSIS — G8929 Other chronic pain: Secondary | ICD-10-CM | POA: Diagnosis not present

## 2023-04-21 DIAGNOSIS — Z4781 Encounter for orthopedic aftercare following surgical amputation: Secondary | ICD-10-CM | POA: Diagnosis not present

## 2023-04-21 DIAGNOSIS — Z7984 Long term (current) use of oral hypoglycemic drugs: Secondary | ICD-10-CM | POA: Diagnosis not present

## 2023-04-21 DIAGNOSIS — Z89431 Acquired absence of right foot: Secondary | ICD-10-CM | POA: Diagnosis not present

## 2023-04-22 ENCOUNTER — Encounter: Payer: Self-pay | Admitting: Internal Medicine

## 2023-04-23 ENCOUNTER — Ambulatory Visit (INDEPENDENT_AMBULATORY_CARE_PROVIDER_SITE_OTHER): Payer: 59 | Admitting: Student

## 2023-04-23 ENCOUNTER — Other Ambulatory Visit (HOSPITAL_COMMUNITY): Payer: Self-pay

## 2023-04-23 ENCOUNTER — Encounter: Payer: Self-pay | Admitting: Student

## 2023-04-23 ENCOUNTER — Other Ambulatory Visit: Payer: Self-pay

## 2023-04-23 VITALS — BP 133/73 | HR 74 | Temp 98.3°F | Resp 32 | Ht 65.0 in | Wt 212.1 lb

## 2023-04-23 DIAGNOSIS — N182 Chronic kidney disease, stage 2 (mild): Secondary | ICD-10-CM | POA: Diagnosis not present

## 2023-04-23 DIAGNOSIS — E1159 Type 2 diabetes mellitus with other circulatory complications: Secondary | ICD-10-CM | POA: Diagnosis not present

## 2023-04-23 DIAGNOSIS — I152 Hypertension secondary to endocrine disorders: Secondary | ICD-10-CM

## 2023-04-23 DIAGNOSIS — E1122 Type 2 diabetes mellitus with diabetic chronic kidney disease: Secondary | ICD-10-CM | POA: Diagnosis not present

## 2023-04-23 DIAGNOSIS — B372 Candidiasis of skin and nail: Secondary | ICD-10-CM

## 2023-04-23 MED ORDER — CLOTRIMAZOLE 1 % EX SOLN
1.0000 | Freq: Two times a day (BID) | CUTANEOUS | 2 refills | Status: AC
  Filled 2023-04-23: qty 30, 15d supply, fill #0

## 2023-04-23 MED ORDER — CLOTRIMAZOLE 1 % EX SOLN
1.0000 | Freq: Two times a day (BID) | CUTANEOUS | 2 refills | Status: DC
  Filled 2023-04-23: qty 30, 15d supply, fill #0

## 2023-04-23 NOTE — Patient Instructions (Addendum)
It was a pleasure seeing you in clinic   Please stop the nystatin cream and try clotrimazole cream twice daily for the rash in the groin Please continue this for 4 week or until the rash is gone  We will draw labs today.  Please follow up with Dr. Mayford Knife on 5/23

## 2023-04-24 ENCOUNTER — Other Ambulatory Visit (HOSPITAL_COMMUNITY): Payer: Self-pay

## 2023-04-24 LAB — BASIC METABOLIC PANEL
BUN/Creatinine Ratio: 14 (ref 12–28)
BUN: 12 mg/dL (ref 8–27)
CO2: 21 mmol/L (ref 20–29)
Calcium: 8.7 mg/dL (ref 8.7–10.3)
Chloride: 103 mmol/L (ref 96–106)
Creatinine, Ser: 0.88 mg/dL (ref 0.57–1.00)
Glucose: 87 mg/dL (ref 70–99)
Potassium: 3.6 mmol/L (ref 3.5–5.2)
Sodium: 143 mmol/L (ref 134–144)
eGFR: 73 mL/min/{1.73_m2} (ref >59)

## 2023-04-27 NOTE — Assessment & Plan Note (Signed)
Has been using nystatin cream since last visit.  Does not feel this is working.  Mild intertriginous changes of bilateral inguinal areas.  Some skin irritation around the perineum which may be associated with intertrigo versus nocturnal moisture associated irritation.  Will switch her to clotrimazole cream to see if this improves her symptoms more.

## 2023-04-27 NOTE — Assessment & Plan Note (Signed)
BP improved with spironolactone 25 mg daily. BMP checked today.
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
## 2023-04-28 ENCOUNTER — Ambulatory Visit: Payer: 59

## 2023-04-28 ENCOUNTER — Other Ambulatory Visit: Payer: 59

## 2023-05-01 ENCOUNTER — Ambulatory Visit (INDEPENDENT_AMBULATORY_CARE_PROVIDER_SITE_OTHER): Payer: 59 | Admitting: Podiatry

## 2023-05-01 DIAGNOSIS — R2681 Unsteadiness on feet: Secondary | ICD-10-CM

## 2023-05-01 NOTE — Progress Notes (Signed)
Internal Medicine Clinic Attending  Case discussed with Dr. Liang  at the time of the visit.  We reviewed the resident's history and exam and pertinent patient test results.  I agree with the assessment, diagnosis, and plan of care documented in the resident's note.  

## 2023-05-01 NOTE — Progress Notes (Signed)
Subjective: Chief Complaint  Patient presents with   gait instability    right transmet amp dos 2.22.24    Jennifer [name] is a 62 y.o. is seen today in office s/p right transmetatarsal amputation preformed on 02/12/2023.  States that she has been doing well.  Does not see any drainage or pus or any open lesions.  She does not report any increase in swelling or redness.  No fevers or chills.  No other concerns.     Objective: General: No acute distress, AAOx3 -son is present. DP/PT pulses palpable , CRT < 3 sec to all digits.  Right foot: Incision is well coapted without any evidence of dehiscence.  There is some dry skin, with scabbing and callus formation which is able debride today.  Incision appears to be healing well overall without any skin breakdown.  There is no new ulcerations.  Minimal edema.  No erythema or warmth.  No fluctuance or crepitation. No pain with calf compression, swelling, warmth, erythema.        Assessment and Plan:  Status post right foot transmetatarsal amputation, localized erythema  -Treatment options discussed including all alternatives, risks, and complications -Recommend switching from Betadine to using a small moisturizer antibiotic ointment on the incision as it is quite dry.  Discussed that she can start to transition to weightbearing in the cam boot as tolerated.  She has been doing physical therapy and she like to switch physical therapy.  New referral will be placed.  Continue elevation, compression. -Monitor for any clinical signs or symptoms of infection and directed to call the office immediately should any occur or go to the ER.  Return for 3-4 weeks for post-op check .  [name] DPM

## 2023-05-06 DIAGNOSIS — E1159 Type 2 diabetes mellitus with other circulatory complications: Secondary | ICD-10-CM | POA: Diagnosis not present

## 2023-05-07 ENCOUNTER — Other Ambulatory Visit (HOSPITAL_COMMUNITY): Payer: Self-pay

## 2023-05-07 ENCOUNTER — Ambulatory Visit (HOSPITAL_COMMUNITY)
Admission: RE | Admit: 2023-05-07 | Discharge: 2023-05-07 | Disposition: A | Discharge status: Home / Self Care | Payer: 59 | Source: Ambulatory Visit | Attending: Internal Medicine | Admitting: Internal Medicine

## 2023-05-07 ENCOUNTER — Other Ambulatory Visit: Payer: Self-pay

## 2023-05-07 DIAGNOSIS — R112 Nausea with vomiting, unspecified: Secondary | ICD-10-CM | POA: Diagnosis not present

## 2023-05-07 DIAGNOSIS — G8929 Other chronic pain: Secondary | ICD-10-CM

## 2023-05-07 MED ORDER — TECHNETIUM TC 99M SULFUR COLLOID
2.0000 | Freq: Once | INTRAVENOUS | Status: AC | PRN
  Administered 2023-05-07: 2.1 via INTRAVENOUS

## 2023-05-08 ENCOUNTER — Other Ambulatory Visit (HOSPITAL_COMMUNITY): Payer: Self-pay

## 2023-05-08 ENCOUNTER — Other Ambulatory Visit: Payer: Self-pay

## 2023-05-08 MED ORDER — OXYCODONE HCL 5 MG PO TABS
5.0000 mg | ORAL_TABLET | ORAL | 0 refills | Status: DC | PRN
Start: 2023-05-08 — End: 2023-06-08

## 2023-05-08 NOTE — Progress Notes (Signed)
Jennifer [name], your test confirms that you do have a slow stomach - it doesn't empty as it should which probably explains your symptoms.  I just tried now to call you and for some reason it didn't connect.  I'll discuss our plan at your appointment next week.  See you soon!

## 2023-05-08 NOTE — Telephone Encounter (Signed)
Pt's phone number is not working.  Please call [phone].  Pt calling back to f/u with her medication refill   oxyCODONE (OXY IR/ROXICODONE) 5 MG immediate release tablet   [id number] - [place], [place] - [address]

## 2023-05-11 ENCOUNTER — Other Ambulatory Visit (HOSPITAL_COMMUNITY): Payer: Self-pay

## 2023-05-11 ENCOUNTER — Other Ambulatory Visit: Payer: Self-pay

## 2023-05-12 ENCOUNTER — Other Ambulatory Visit: Payer: 59 | Admitting: Pharmacist

## 2023-05-12 ENCOUNTER — Telehealth: Payer: Self-pay | Admitting: Pharmacist

## 2023-05-12 ENCOUNTER — Ambulatory Visit: Payer: 59 | Attending: Podiatry | Admitting: Physical Therapy

## 2023-05-12 NOTE — Progress Notes (Unsigned)
Attempted to contact patient for scheduled appointment for medication management. Left HIPAA compliant message for patient to return my call at their convenience.    Catie T. Larah Kuntzman, PharmD, BCACP, CPP [facility] [phone]  

## 2023-05-13 ENCOUNTER — Other Ambulatory Visit: Payer: Self-pay

## 2023-05-13 ENCOUNTER — Other Ambulatory Visit (HOSPITAL_COMMUNITY): Payer: Self-pay

## 2023-05-14 ENCOUNTER — Other Ambulatory Visit: Payer: Self-pay

## 2023-05-14 ENCOUNTER — Other Ambulatory Visit (HOSPITAL_COMMUNITY): Payer: Self-pay

## 2023-05-14 ENCOUNTER — Ambulatory Visit (INDEPENDENT_AMBULATORY_CARE_PROVIDER_SITE_OTHER): Payer: 59 | Admitting: Internal Medicine

## 2023-05-14 ENCOUNTER — Encounter: Payer: Self-pay | Admitting: Dietician

## 2023-05-14 DIAGNOSIS — R159 Full incontinence of feces: Secondary | ICD-10-CM | POA: Diagnosis not present

## 2023-05-14 DIAGNOSIS — K30 Functional dyspepsia: Secondary | ICD-10-CM

## 2023-05-14 DIAGNOSIS — R197 Diarrhea, unspecified: Secondary | ICD-10-CM

## 2023-05-14 DIAGNOSIS — R634 Abnormal weight loss: Secondary | ICD-10-CM

## 2023-05-14 NOTE — Progress Notes (Signed)
[facility] Telephone Encounter Continuity Care Appointment  HPI:  This telephone encounter was created for [name] on 05/14/2023 for the following purpose/cc  - f/u on her chronic conditions as planned (transportation not available for in person appt).    32 you [name] (T2dm with severe neuropathy; HTN;PAD; LLE amputee) here for close f/u of chronic conditions but also to readdress her UGI sxs suspicious for gastroparesis discussed one month ago.  Gastric emptying study ordered.  In the interim she was also seen in [facility] for an intertriginous rash not responding to topical nystatin, and has been seen by podiatrist in f/u of her transmet amputation months ago.    Not doing too well.  Diarrhea, everything eating or drinking comes through water.   Losing weight, despite eating.  Ozempic has lost a lot of weight. On 1 mg weekly.  Had been off ozempic for a month, still losing weight.restarted 05/12/23; did not make any sxs  (alrleady having diarrhea).   N/V didn't improve during that month.  Rash not better. Always wet. Only time doesn't notice is at [place] running at night.   Itching all over. Last hepatobil enzymes 2/2o24 nml  I need to see her in person - appt scheduled for June 4 with labs Referral to GI    Past Medical History:  Past Medical History:  Diagnosis Date   Acute vestibular syndrome, resolved 03/02/2021   Angioedema 06/14/2021   Asthma    Burn of finger of right hand, second degree 02/05/2021   Occurred during cooking (frying), poor sensation due to neuropathy, pt punctured blister to allow it to drain, skin has since desquamated over dorsal joint, no infection.  Keep clean and dry, OTC antibacterial ointment.   Cataract    CHF (congestive heart failure) (HCC)    Chronic bronchitis (HCC)    "I get it alot" (09/28/2013)   Chronic diastolic heart failure (HCC)    grade 2 per 2D echocardiogram (01/2013)   Chronic kidney  disease    Chronic lower back pain    Chronic pain syndrome 12/03/2011   Likely secondary to depression, "fibromyalgia", neuropathy, and obesity. Lumbar MRI 2014 no sig change from prior (2008) : Stable hypertrophic facet disease most notable at L4-5. Stable shallow left foraminal/extraforaminal disc protrusion at L4-5. No direct neural compression.       Chronic right shoulder pain 10/10/2021   COPD 01/08/2007   PFT's 05/2007 : FEV1/FVC 82, FEV1 64% pred, FEF 25-75% 40% predicted, 16% improvement in FEV1 with bronchodilators.      Depression    Diabetic peripheral neuropathy (HCC)    Dizziness, resolved (admitted with vestibular migraine)    DVT of upper extremity (deep vein thrombosis) (HCC) 03/11/2013   Secondary to PICC line. Right brachial vein, diagnosed on 03/10/2013 Coumadin for 3 months. 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  S. Aureus and GBS.    History of use of hearing  aid    Hyperlipidemia    Hyperplastic colon polyp 12/2010   Per colonoscopy (12/2010) - Dr. 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       Initially evaluated 08/2019 with dedicated MRI: "IMPRESSION:  Skin marker overlies a 9 x 10 x 11 mm cyst within the anterior  aspect of the  superficial lobe of the right parotid gland. This is  presumed to be a benign cyst. The possibility of a cystic Warthin's  tumor   Pneumonia    PVD (peripheral vascular disease) with claudication (HCC)    Stents to bilateral common iliac arteries (left 2005, right 2008), on chronic plavix   Pyelonephritis 10/28/2020   S/P BKA (below knee amputation) unilateral (HCC)    2014 L - failed limb preserving treatment. 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  Evaluated on 07/08/2021 by podiatrist Dr. Logan Bores who debrided necrotic tissue and prescribed doxycycline.  He will see her again in 3 weeks.  The location of this ulcer on the dorsal aspect of the toe is somewhat atypical for a purely diabetic foot wound, and she does have a strong DP pulse.  I did not examine h   Type II diabetes mellitus with peripheral circulatory disorders, uncontrolled DX: 1993   Insulin dep. Poor control. Complicated by diabetic foot ulcer and diabetic eye disease.     Upper respiratory tract infection due to COVID-19 virus 07/22/2022   Urinary incontinence, severe, mixed (stress, urge, functional) 02/14/2023   Unresponsive to medications     ROS:     Assessment / Plan / Recommendations:  Please see A&P under problem oriented charting for assessment of the patient's acute and chronic medical conditions.  As always, pt is advised that if symptoms worsen or new symptoms arise, they should go to an urgent care facility or to to ER for further evaluation.   Consent and Medical Decision Making:   This is a telephone encounter between [name] and Miguel Aschoff on 05/14/2023 for c/u of chronic conditions and to discuss new problems. The visit was conducted with the patient located at  home and Miguel Aschoff at [facility]. The patient's identity was confirmed using their DOB and current address. The patient has consented to being evaluated through a telephone encounter and understands the associated risks (an examination cannot be done and the patient may need to come in for an appointment) / benefits (allows the patient to remain at home, decreasing exposure to coronavirus). I personally spent 16 minutes on medical discussion.

## 2023-05-15 ENCOUNTER — Other Ambulatory Visit (HOSPITAL_COMMUNITY): Payer: Self-pay

## 2023-05-15 ENCOUNTER — Other Ambulatory Visit: Payer: Self-pay

## 2023-05-19 ENCOUNTER — Other Ambulatory Visit: Payer: Self-pay

## 2023-05-19 ENCOUNTER — Telehealth: Payer: Self-pay | Admitting: Internal Medicine

## 2023-05-19 NOTE — Telephone Encounter (Signed)
-----   Message from Miguel Aschoff, MD sent at 05/14/2023 11:56 AM EDT ----- Regarding: appt plz Please schedule with me on 05/26/23 am. One hour (cognitive eval and f/u chronic/acute concerns). Thank you!

## 2023-05-19 NOTE — Telephone Encounter (Signed)
Please refer to message below.  Attempted to contact patient to make her aware of appointment next week 05/26/23 at 8:45 am with Dr. Mayford Knife.  No answer, but detailed message left with date, time and to call back if unable to come in.

## 2023-05-25 ENCOUNTER — Other Ambulatory Visit (HOSPITAL_BASED_OUTPATIENT_CLINIC_OR_DEPARTMENT_OTHER): Payer: Self-pay

## 2023-05-25 ENCOUNTER — Ambulatory Visit: Payer: 59 | Admitting: Physical Therapy

## 2023-05-25 DIAGNOSIS — K30 Functional dyspepsia: Secondary | ICD-10-CM | POA: Insufficient documentation

## 2023-05-26 ENCOUNTER — Ambulatory Visit: Payer: 59 | Attending: Podiatry | Admitting: Physical Therapy

## 2023-05-26 ENCOUNTER — Ambulatory Visit (INDEPENDENT_AMBULATORY_CARE_PROVIDER_SITE_OTHER): Payer: 59 | Admitting: Internal Medicine

## 2023-05-26 ENCOUNTER — Telehealth: Payer: Self-pay | Admitting: Dietician

## 2023-05-26 VITALS — BP 157/72 | HR 76 | Temp 98.1°F | Ht 65.0 in | Wt 215.6 lb

## 2023-05-26 DIAGNOSIS — R2689 Other abnormalities of gait and mobility: Secondary | ICD-10-CM | POA: Diagnosis not present

## 2023-05-26 DIAGNOSIS — I499 Cardiac arrhythmia, unspecified: Secondary | ICD-10-CM

## 2023-05-26 DIAGNOSIS — I872 Venous insufficiency (chronic) (peripheral): Secondary | ICD-10-CM

## 2023-05-26 DIAGNOSIS — R296 Repeated falls: Secondary | ICD-10-CM | POA: Diagnosis not present

## 2023-05-26 DIAGNOSIS — G8929 Other chronic pain: Secondary | ICD-10-CM | POA: Diagnosis not present

## 2023-05-26 DIAGNOSIS — K529 Noninfective gastroenteritis and colitis, unspecified: Secondary | ICD-10-CM

## 2023-05-26 DIAGNOSIS — E1142 Type 2 diabetes mellitus with diabetic polyneuropathy: Secondary | ICD-10-CM

## 2023-05-26 DIAGNOSIS — I152 Hypertension secondary to endocrine disorders: Secondary | ICD-10-CM | POA: Diagnosis not present

## 2023-05-26 DIAGNOSIS — R2681 Unsteadiness on feet: Secondary | ICD-10-CM | POA: Diagnosis not present

## 2023-05-26 DIAGNOSIS — E1151 Type 2 diabetes mellitus with diabetic peripheral angiopathy without gangrene: Secondary | ICD-10-CM | POA: Diagnosis not present

## 2023-05-26 DIAGNOSIS — I5032 Chronic diastolic (congestive) heart failure: Secondary | ICD-10-CM | POA: Diagnosis not present

## 2023-05-26 DIAGNOSIS — Z6835 Body mass index (BMI) 35.0-35.9, adult: Secondary | ICD-10-CM

## 2023-05-26 DIAGNOSIS — E1159 Type 2 diabetes mellitus with other circulatory complications: Secondary | ICD-10-CM | POA: Diagnosis not present

## 2023-05-26 DIAGNOSIS — R197 Diarrhea, unspecified: Secondary | ICD-10-CM | POA: Diagnosis not present

## 2023-05-26 DIAGNOSIS — M6281 Muscle weakness (generalized): Secondary | ICD-10-CM | POA: Insufficient documentation

## 2023-05-26 DIAGNOSIS — M545 Low back pain, unspecified: Secondary | ICD-10-CM | POA: Diagnosis not present

## 2023-05-26 DIAGNOSIS — R112 Nausea with vomiting, unspecified: Secondary | ICD-10-CM

## 2023-05-26 DIAGNOSIS — Z89512 Acquired absence of left leg below knee: Secondary | ICD-10-CM | POA: Diagnosis not present

## 2023-05-26 DIAGNOSIS — R159 Full incontinence of feces: Secondary | ICD-10-CM

## 2023-05-26 NOTE — Assessment & Plan Note (Signed)
Baseline rhythm is regular, with ectopic beat noted every 7-12 beats.  Asymptomatic, monitor.

## 2023-05-26 NOTE — Patient Instructions (Addendum)
Tinnie Gens,  I'm sorry things have been so rough with the diarrhea and neuropathy pain.  I'm committed to helping [place] get to an answer and helping you feel better.  I'll call you with your test results and the plan for your blood pressure medicine.  Important to keep your [place] appointments so that we can get your digestive system evaluated.  Let's get together in two weeks.  Please me if needed in the meantime.  Take care,  Dr. Mayford Knife

## 2023-05-26 NOTE — Assessment & Plan Note (Signed)
Long term/chronic.  Still experiencing early satiety. 2-3  episodes of vomiting per week; usually undigested food from day before.  "I feel like I've had a lap band procedure". Gastric emptying study (had been off GLP!a for about a month) showed delayed emptying at 4 hrs (79%, normal >= 90%).  It is unusual that the symptoms were not improved during time period w/o  GLP1a, nor worsened when she resumed it about 2 weeks ago.  Diabetic gastroparesis is most likely (she has severe polyneuropathy) and it appears safe to continue her Ozempic.  Metoclopramide trial will be considered once her lower GI fxn is assessed.

## 2023-05-26 NOTE — Assessment & Plan Note (Signed)
Chronic RLE edema persists, though this is more dependent edema than exacerbation of heart failure. She is asymptomatic. Lungs are clear.  No orthopnea.  Intolerant of diuretics and none needed recently.

## 2023-05-26 NOTE — Assessment & Plan Note (Signed)
No change in heavily edematous leg.  Intolerant of diuretics due to urinary incontinence and associated moisture induced perineal skin injury.
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
## 2023-05-26 NOTE — Assessment & Plan Note (Signed)
157/72, uncontrolled, took meds this am. Check kidneys and plan for increase in spironolactone to 50 mg daily.  I"ll call her with plan.

## 2023-05-26 NOTE — Progress Notes (Signed)
Jennifer [name] is here for close f/u and to explore her diarrhea in more detail.  No new events since last week's telehealth visit.  At that time we were concerned about unintended weight loss (beyond expected for her GLP1a) and ongoing GI symptoms.  The one year anniversary of her son's death is approaching, which is on her mind.  BP (!) 157/72 (BP Location: Right Arm, Patient Position: Sitting, Cuff Size: Small)   Pulse 76   Temp 98.1 F (36.7 C) (Oral)   Ht 5\' 5"  (1.651 m)   Wt 215 lb 9.6 oz (97.8 kg)   SpO2 100%   BMI 35.88 kg/m  Jennifer [name] is comfortably dressed, sitting in the transport chair with her left leg prosthesis and her right postop boot in place.  Her mood is positive and concentration is good.  Lungs are clear.  Heart regular rate and rhythm with periodic ectopy.  Abdomen with normal active bowel sounds, mild tenderness in epigastrium and in left abdomen.  Palpation with pressure caused nausea.  RLE heavily edematous, skin taut, anterior lower leg with healing skin scrape from a prior fall.  Right distal foot dressed with nonstick Vaseline pad with overlying cushion dressing held in place with gauze overwrapped and Ace bandage.  Early abrasions from her dressings visible on the crease of her anterior ankle.  Surgical wound is healing nicely.  A very small area remains incompletely closed.  She has a follow-up appointment this Friday with the podiatrist.   Chronic diarrhea with nocturnal fecal incontinence Onset approximately 6 months ago.   Diarrhea is particularly malodorous, watery, thin, light orangey brown color, minimal solid particulates. Some floats to top of water in toilet bowl; requires  up to 5-6 flushes.   Occurs more than once a night, several times during day. Sometimes makes it to bathroom, sometimes experiences incontinence, even during sleep. Volume is "a lot". Sometimes painful cramping, frequent abdominal gurgling.  She recalls that prior to this, bm's had been soft  (never hard) and easy to pass, with no fecal incontinence.    Weight loss and characteristics of stool render malabsorption a consideration; will submit stool for qualitative fat and fecal elastase.  Colitis in differential. Weight loss could have other causes as well.  GI consultation end of this month and endoscopy planned for July.   Nausea and vomiting; early satiety Long term/chronic.  Still experiencing early satiety. 2-3  episodes of vomiting per week; usually undigested food from day before.  "I feel like I've had a lap band procedure". Gastric emptying study (had been off GLP!a for about a month) showed delayed emptying at 4 hrs (79%, normal >= 90%).  It is unusual that the symptoms were not improved during time period w/o  GLP1a, nor worsened when she resumed it about 2 weeks ago.  Diabetic gastroparesis is most likely (she has severe polyneuropathy) and it appears safe to continue her Ozempic.  Metoclopramide trial will be considered once her lower GI fxn is assessed.   Hypertension associated with diabetes (HCC) 157/72, uncontrolled, took meds this am. Check kidneys and plan for increase in spironolactone to 50 mg daily.  I"ll call her with plan.  Edema of right lower extremity due to peripheral venous insufficiency No change in heavily edematous leg.  Intolerant of diuretics due to urinary incontinence and associated moisture induced perineal skin injury.   Class 2 severe obesity with serious comorbidity and body mass index (BMI) of 35.0 to 35.9 in adult [facility]) Weight loss  on Ozempic steadily progresses.  Her food choices are calorie-heavy and include empty calories (soda).    Episodic irregularly irregular heart rhythm Baseline rhythm is regular, with ectopic beat noted every 7-12 beats.  Asymptomatic, monitor.    Diabetic polyneuropathy associated with type 2 diabetes mellitus (HCC) Remains very symptomatic; experienced sudden shooting pains in limbs during today's visit.  On very  high dose pregabalin 200 mg tid (no adverse effects). I didn't increase duloxetine from 30 bid to 60 bid at last visit as planned which I'll discuss at next visit (today was focused on GI symptoms).    Chronic diastolic heart failure (HCC) Chronic RLE edema persists, though this is more dependent edema than exacerbation of heart failure. She is asymptomatic. Lungs are clear.  No orthopnea.  Intolerant of diuretics and none needed recently.    Plan follow-up in 2 weeks.  I will call her with her test results and decision on her blood pressure medicine.

## 2023-05-26 NOTE — Assessment & Plan Note (Signed)
Onset approximately 6 months ago.   Diarrhea is particularly malodorous, watery, thin, light orangey brown color, minimal solid particulates. Some floats to top of water in toilet bowl; requires  up to 5-6 flushes.   Occurs more than once a night, several times during day. Sometimes makes it to bathroom, sometimes experiences incontinence, even during sleep. Volume is "a lot". Sometimes painful cramping, frequent abdominal gurgling.  She recalls that prior to this, bm's had been soft (never hard) and easy to pass, with no fecal incontinence.    Weight loss and characteristics of stool render malabsorption a consideration; will submit stool for qualitative fat and fecal elastase.  Colitis in differential. Weight loss could have other causes as well.  GI consultation end of this month and endoscopy planned for July.

## 2023-05-26 NOTE — Assessment & Plan Note (Signed)
Weight loss on Ozempic steadily progresses.  Her food choices are calorie-heavy and include empty calories (soda).

## 2023-05-26 NOTE — Telephone Encounter (Signed)
Patient asked for help in getting her freestyle libre 3 sensors. She had been sent FL2 sensors incorrectly. Call to [employer], patient had corrected the order and FL3 sensors should be at her  house by Wednesday.

## 2023-05-26 NOTE — Assessment & Plan Note (Signed)
Remains very symptomatic; experienced sudden shooting pains in limbs during today's visit.  On very high dose pregabalin 200 mg tid (no adverse effects). I didn't increase duloxetine from 30 bid to 60 bid at last visit as planned which I'll discuss at next visit (today was focused on GI symptoms).

## 2023-05-27 LAB — CMP14 + ANION GAP
ALT: 9 IU/L (ref 0–32)
AST: 19 IU/L (ref 0–40)
Albumin/Globulin Ratio: 1.3 (ref 1.2–2.2)
Albumin: 3.8 g/dL — ABNORMAL LOW (ref 3.9–4.9)
Alkaline Phosphatase: 85 IU/L (ref 44–121)
Anion Gap: 15 mmol/L (ref 10.0–18.0)
BUN/Creatinine Ratio: 20 (ref 12–28)
BUN: 18 mg/dL (ref 8–27)
Bilirubin Total: <0.2 mg/dL (ref 0.0–1.2)
CO2: 23 mmol/L (ref 20–29)
Calcium: 9.2 mg/dL (ref 8.7–10.3)
Chloride: 105 mmol/L (ref 96–106)
Creatinine, Ser: 0.91 mg/dL (ref 0.57–1.00)
Globulin, Total: 3 g/dL (ref 1.5–4.5)
Glucose: 130 mg/dL — ABNORMAL HIGH (ref 70–99)
Potassium: 3.6 mmol/L (ref 3.5–5.2)
Sodium: 143 mmol/L (ref 134–144)
Total Protein: 6.8 g/dL (ref 6.0–8.5)
eGFR: 70 mL/min/{1.73_m2} (ref >59)

## 2023-05-29 ENCOUNTER — Ambulatory Visit (INDEPENDENT_AMBULATORY_CARE_PROVIDER_SITE_OTHER): Payer: 59 | Admitting: Podiatry

## 2023-05-29 DIAGNOSIS — Z89431 Acquired absence of right foot: Secondary | ICD-10-CM

## 2023-05-29 DIAGNOSIS — R2681 Unsteadiness on feet: Secondary | ICD-10-CM | POA: Diagnosis not present

## 2023-06-02 ENCOUNTER — Encounter: Payer: Self-pay | Admitting: Physical Therapy

## 2023-06-02 ENCOUNTER — Ambulatory Visit: Payer: 59 | Admitting: Physical Therapy

## 2023-06-02 VITALS — BP 162/88 | HR 68

## 2023-06-02 DIAGNOSIS — G8929 Other chronic pain: Secondary | ICD-10-CM | POA: Diagnosis not present

## 2023-06-02 DIAGNOSIS — R2689 Other abnormalities of gait and mobility: Secondary | ICD-10-CM | POA: Diagnosis not present

## 2023-06-02 DIAGNOSIS — R2681 Unsteadiness on feet: Secondary | ICD-10-CM | POA: Diagnosis not present

## 2023-06-02 DIAGNOSIS — R296 Repeated falls: Secondary | ICD-10-CM | POA: Diagnosis not present

## 2023-06-02 DIAGNOSIS — M6281 Muscle weakness (generalized): Secondary | ICD-10-CM | POA: Diagnosis not present

## 2023-06-02 DIAGNOSIS — M545 Low back pain, unspecified: Secondary | ICD-10-CM

## 2023-06-02 DIAGNOSIS — Z89512 Acquired absence of left leg below knee: Secondary | ICD-10-CM | POA: Diagnosis not present
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
## 2023-06-04 NOTE — Progress Notes (Signed)
Subjective: Chief Complaint  Patient presents with   Routine Post Op    Rm 11 Right foot post op. Pt states she is doing well. No nv, fever or chills.     ISHIA TENORIO is a 62 y.o. is seen today in office s/p right transmetatarsal amputation preformed on 02/12/2023.  States that she been doing well.  She recently has started physical therapy.  No ulcerations that she reports today.  No fevers or chills.  No other concerns.     Objective: General: No acute distress, AAOx3 -son is present. DP/PT pulses palpable , CRT < 3 sec to all digits.  Right foot: Incision is well coapted without any evidence of dehiscence.  It appears the incision is healed.  There is still some mild edema there is no erythema, warmth or any signs of infection noted today.  No new ulceration present. No pain with calf compression, swelling, warmth, erythema.   Assessment and Plan:  Status post right foot transmetatarsal amputation, localized erythema  -Treatment options discussed including all alternatives, risks, and complications -Send appears to have healed well.  Discussed weightbearing in boot and continued physical therapy.  I do want to measure her for toe filler, inserts but the swelling to improve first before proceeding with this.  I will schedule her for follow-up for measurement of inserts and I will see her back in a couple months as well.  Discussed daily foot inspection.  If any changes to let me know immediately.  [name] DPM

## 2023-06-06 DIAGNOSIS — E1159 Type 2 diabetes mellitus with other circulatory complications: Secondary | ICD-10-CM | POA: Diagnosis not present

## 2023-06-08 ENCOUNTER — Other Ambulatory Visit: Payer: Self-pay | Admitting: Internal Medicine

## 2023-06-08 DIAGNOSIS — G8929 Other chronic pain: Secondary | ICD-10-CM

## 2023-06-08 NOTE — Telephone Encounter (Signed)
oxyCODONE (OXY IR/ROXICODONE) 5 MG immediate release table   [id number] - [place], [place] - [address]

## 2023-06-09 ENCOUNTER — Other Ambulatory Visit (HOSPITAL_COMMUNITY): Payer: Self-pay

## 2023-06-09 ENCOUNTER — Telehealth: Payer: Self-pay | Admitting: *Deleted

## 2023-06-09 ENCOUNTER — Ambulatory Visit: Payer: 59 | Admitting: Physical Therapy

## 2023-06-09 VITALS — BP 155/83 | HR 76

## 2023-06-09 DIAGNOSIS — R2681 Unsteadiness on feet: Secondary | ICD-10-CM | POA: Diagnosis not present

## 2023-06-09 DIAGNOSIS — R2689 Other abnormalities of gait and mobility: Secondary | ICD-10-CM | POA: Diagnosis not present

## 2023-06-09 DIAGNOSIS — M545 Low back pain, unspecified: Secondary | ICD-10-CM | POA: Diagnosis not present

## 2023-06-09 DIAGNOSIS — G8929 Other chronic pain: Secondary | ICD-10-CM | POA: Diagnosis not present

## 2023-06-09 DIAGNOSIS — Z89512 Acquired absence of left leg below knee: Secondary | ICD-10-CM | POA: Diagnosis not present

## 2023-06-09 DIAGNOSIS — M6281 Muscle weakness (generalized): Secondary | ICD-10-CM | POA: Diagnosis not present

## 2023-06-09 DIAGNOSIS — R296 Repeated falls: Secondary | ICD-10-CM

## 2023-06-09 MED ORDER — OXYCODONE HCL 5 MG PO TABS
5.0000 mg | ORAL_TABLET | ORAL | 0 refills | Status: DC | PRN
Start: 2023-06-09 — End: 2023-07-02

## 2023-06-09 NOTE — Telephone Encounter (Signed)
Oxycodone: Last rx written - 05/08/23. Last OV - 05/26/23. Next OV - 06/16/23. TOX - 06/16/19. Pt also wants Dr Mayford Knife to call her; she did not state for what reason.

## 2023-06-09 NOTE — Telephone Encounter (Signed)
Pt called again - requesting refill on Oxycodone today since office is closed tomorrow. Thanks

## 2023-06-09 NOTE — Telephone Encounter (Signed)
Please call patient regarding her medication. °
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
## 2023-06-09 NOTE — Telephone Encounter (Signed)
Returned pt's call no answer. Unable to leave a message (mail box is full)

## 2023-06-10 ENCOUNTER — Other Ambulatory Visit: Payer: Self-pay

## 2023-06-10 ENCOUNTER — Other Ambulatory Visit (HOSPITAL_COMMUNITY): Payer: Self-pay

## 2023-06-10 DIAGNOSIS — G547 Phantom limb syndrome without pain: Secondary | ICD-10-CM | POA: Insufficient documentation

## 2023-06-10 DIAGNOSIS — M47816 Spondylosis without myelopathy or radiculopathy, lumbar region: Secondary | ICD-10-CM | POA: Insufficient documentation

## 2023-06-11 ENCOUNTER — Other Ambulatory Visit: Payer: Self-pay | Admitting: Internal Medicine

## 2023-06-11 DIAGNOSIS — J449 Chronic obstructive pulmonary disease, unspecified: Secondary | ICD-10-CM

## 2023-06-11 NOTE — Telephone Encounter (Signed)
Last appointment 05/26/2023.  Next appointment 06/16/2023.

## 2023-06-11 NOTE — Telephone Encounter (Signed)
Pt calles [place] and they stated it is no longer on her meds list.  Pt states she still uses this inhaler and would like a refill  umeclidinium-vilanterol umeclidinium-vilanterol (ANORO ELLIPTA) 62.5-25 MCG/ACT AEPB

## 2023-06-12 ENCOUNTER — Other Ambulatory Visit: Payer: Self-pay

## 2023-06-12 ENCOUNTER — Other Ambulatory Visit (HOSPITAL_COMMUNITY): Payer: Self-pay

## 2023-06-12 ENCOUNTER — Other Ambulatory Visit: Payer: Self-pay | Admitting: Internal Medicine

## 2023-06-12 MED ORDER — UMECLIDINIUM-VILANTEROL 62.5-25 MCG/ACT IN AEPB
1.0000 | INHALATION_SPRAY | Freq: Every morning | RESPIRATORY_TRACT | 11 refills | Status: DC
Start: 2023-06-12 — End: 2023-08-06
  Filled 2023-06-12: qty 180, 90d supply, fill #0

## 2023-06-15 ENCOUNTER — Other Ambulatory Visit: Payer: Self-pay

## 2023-06-15 ENCOUNTER — Other Ambulatory Visit: Payer: 59 | Admitting: Pharmacist

## 2023-06-15 NOTE — Progress Notes (Signed)
06/15/2023 Name: Jennifer [name] MRN: [id number] DOB: 01-04-1961  Chief Complaint  Patient presents with   Medication Management   Diabetes   Hypertension   Hyperlipidemia    Jennifer [name] is a 62 y.o. year old female who presented for a telephone visit.   They were referred to the pharmacist by their PCP for assistance in managing complex medication management.    Subjective:  Care Team: Primary Care Provider: Miguel Aschoff, MD ; Next Scheduled Visit: tomorrow  Medication Access/Adherence  Current Pharmacy:  [facility], [place] - [address] AT [address] [place] [address] Phone: [phone] Fax: [phone]  [place] [facility] [address] [place] [address] Phone: [phone] Fax: [phone]  [facility] [address] [place] - [address] [place] [address] Phone: [phone] Fax: [phone]   - [facility] [address] [place] [address] Phone: [phone] Fax: [phone]   Patient reports affordability concerns with their medications: No  Patient reports access/transportation concerns to their pharmacy: No  Patient reports adherence concerns with their medications:  No    Working on adherence packaging with [facility]. Plan:   AM: amlodipine, aspirin; duloxetine, baclofen, metformin, metoprolol, myrbetriq, pregabalin, pantoprazole, rosuvastatin PM: duloxetine, baclofen, metformin, pregabalin, oxybutynin  Will be due for packaging next month. Spironolactone cannot be packaged.  Diabetes:   Current medications: metformin 500 mg twice daily, Ozempic 1 mg weekly, Tresiba 65 units daily   Notes continued diarrhea and weight loss. Does not appear that metformin XR has been tried before. However, patient has been on IR metformin for longer than diarrhea symptoms have been occurring   Does  report episodes of AM hypoglycemia to 60s over the past few weeks. Wakes up to CGM alarm.   Hypertension:   Current medications: amlodipine 10 mg daily, metoprolol succinate 100 mg daily, spironolactone 25 mg daily - Scr/K normal s/p spironolactone initiation   Has not been checking home BP readings. Reports she had taken AM medications prior to going to PT last week.  Does note that she thinks her vertigo is back. Notes dizziness when sitting up from lying down, but later notes that dizziness also occurs while lying down.   Hyperlipidemia/ASCVD Risk Reduction   Current lipid lowering medications: rosuvastatin 20 mg daily   Chronic Pain and Insomnia Current medications: duloxetine 30 mg twice daily, oxycodone 5 mg PRN; pregabalin 200 mg three times daily; trazodone 200 mg PRN - reports she only uses periodically, has not needed often; baclofen 5 mg twice daily - reports she is taking a whole 10 mg tablet twice daily   Current medications: Myrbetriq 50 mg daily, oxybutynin XL 10 mg daily   COPD: Anoro 1 puff once daily; albuterol HFA PRN  - using at least daily  Objective:  Lab Results  Component Value Date   HGBA1C 6.3 (A) 03/31/2023    Lab Results  Component Value Date   CREATININE 0.91 05/26/2023   BUN 18 05/26/2023   NA 143 05/26/2023   K 3.6 05/26/2023   CL 105 05/26/2023   CO2 23 05/26/2023    Lab Results  Component Value Date   CHOL 115 03/31/2023   HDL 45 03/31/2023   LDLCALC 55 03/31/2023   TRIG 72 03/31/2023   CHOLHDL 2.6 03/31/2023    Medications Reviewed Today     Reviewed by Jennifer [name], RPH-CPP (Pharmacist) on  06/15/23 at 1151  Med List Status: <None>   Medication Order Taking? Sig Documenting Provider Last Dose Status Informant  acetaminophen (TYLENOL) 650 MG CR tablet [id number] Yes Take 2 tablets (1,300 mg total) by mouth every 8 (eight) hours as needed for pain. Mapp, Gaylyn Cheers, MD Taking Active   albuterol [facility] HFA) 108 [phone] Base) MCG/ACT  inhaler [id number] Yes INHALE 2 PUFFS BY MOUTH EVERY 6 HOURS AS NEEDED FOR WHEEZING Miguel Aschoff, MD Taking Active Self  amLODipine (NORVASC) 10 MG tablet [id number] No Take 1 tablet (10 mg total) by mouth daily.  Patient not taking: Reported on 06/15/2023   Miguel Aschoff, MD Not Taking Active Self  aspirin EC 81 MG tablet [id number] Yes Take 1 tablet (81 mg total) by mouth daily. Miguel Aschoff, MD Taking Active Self  baclofen (LIORESAL) 10 MG tablet [id number] Yes Take 0.5 tablets (5 mg total) by mouth 2 (two) times daily. Miguel Aschoff, MD Taking Active   camphor-menthol [place]) lotion [id number]  Apply topically as needed for itching. Mapp, Gaylyn Cheers, MD  Active   Continuous Blood Gluc Sensor (FREESTYLE LIBRE 3 SENSOR) [place] [id number] Yes Place 1 sensor on the skin every 14 days. Use to check glucose continuously Miguel Aschoff, MD Taking Active   Continuous Glucose Receiver (FREESTYLE LIBRE 3 READER) DEVI [id number]  Use to continuously monitor blood glucose Miguel Aschoff, MD  Active   DULoxetine (CYMBALTA) 30 MG capsule [id number] Yes Take 1 capsule (30 mg total) by mouth 2 (two) times daily. Miguel Aschoff, MD Taking Active   glucose blood test strip [id number] Yes Use 3 (three) times daily. Mapp, Gaylyn Cheers, MD Taking Active   insulin degludec (TRESIBA FLEXTOUCH) 200 UNIT/ML FlexTouch Pen [id number] Yes Inject 65 Units into the skin daily. Miguel Aschoff, MD Taking Active   Insulin Pen Needle 32G X 4 MM MISC [id number]  Use to inject insulin 4 (four) times daily. Miguel Aschoff, MD  Active   Lancets MISC [id number]  Use as directed Miguel Aschoff, MD  Active Self  metFORMIN (GLUCOPHAGE) 500 MG tablet [id number] Yes Take 1 tablet (500 mg total) by mouth 2 (two) times daily. Miguel Aschoff, MD Taking Active   metoprolol succinate (TOPROL-XL) 100 MG 24 hr tablet [id number] Yes Take 1 tablet (100 mg total) by mouth daily. TAKE WITH OR IMMEDIATELY  FOLLOWING A MEAL. Miguel Aschoff, MD Taking Active   Multiple Vitamin (MULTIVITAMIN) tablet [id number]  Take 1 tablet by mouth daily. [provider]  Active Self  MYRBETRIQ 50 MG TB24 tablet [id number] Yes Take 1 tablet (50 mg total) by mouth daily. Miguel Aschoff, MD Taking Active   ondansetron (ZOFRAN-ODT) 4 MG disintegrating tablet [id number] Yes Take 1 tablet (4 mg total) by mouth every 8 (eight) hours as needed for nausea or vomiting. Miguel Aschoff, MD Taking Active   OVER THE COUNTER MEDICATION [id number] Yes Take 1 tablet by mouth daily. [facility] otc supplement [provider] Taking Active Self  oxybutynin (DITROPAN-XL) 10 MG 24 hr tablet [id number] Yes Take 1 tablet (10 mg total) by mouth daily. Miguel Aschoff, MD Taking Active   oxyCODONE (OXY IR/ROXICODONE) 5 MG immediate release tablet [id number] Yes Take 1 tablet (5 mg total) by mouth every 4 (four) hours as needed for severe pain. Miguel Aschoff, MD Taking Active   pantoprazole (PROTONIX) 40 MG tablet [id number] Yes Take 1 tablet (40 mg total) by mouth daily. Miguel Aschoff, MD Taking Active  Discontinued 01/23/14 1328 (Reorder)   pregabalin (LYRICA) 200 MG capsule [id number] Yes Take 1 capsule (200 mg total) by mouth 3 (three) times daily. Miguel Aschoff, MD Taking Active   rosuvastatin (CRESTOR) 20 MG tablet [id number] Yes Take 1 tablet (20 mg total) by mouth in the morning. Miguel Aschoff, MD Taking Active   Semaglutide, 1 MG/DOSE, (OZEMPIC, 1 MG/DOSE,) 4 MG/3ML SOPN [id number] Yes Inject 1 mg into the skin once a week. Miguel Aschoff, MD Taking Active Self           Med Note Clearance Coots, Orpheus [place] T   Wed Apr 01, 2023  3:48 PM)    senna-docusate (SENOKOT-S) 8.6-50 MG tablet [id number]  Take 1 tablet by mouth at bedtime.  Patient not taking: Reported on 04/01/2023   Karoline Caldwell, MD  Active   spironolactone (ALDACTONE) 25 MG tablet [id number] Yes Take 1 tablet (25 mg  total) by mouth daily. Miguel Aschoff, MD Taking Active   traZODone (DESYREL) 100 MG tablet [id number] No Take 2 tablets (200 mg total) by mouth at bedtime as needed for sleep.  Patient not taking: Reported on 06/15/2023   Miguel Aschoff, MD Not Taking Active Self  umeclidinium-vilanterol [facility] ELLIPTA) 62.5-25 MCG/ACT AEPB [id number] Yes Inhale 1 puff into the lungs in the morning. Miguel Aschoff, MD Taking Active               Assessment/Plan:   Diabetes: - Controlled but with reports of hypoglycemia. Will collaborate with PCP and CDE regarding insulin adjustments.  - Could consider trial of metformin XR 500 mg twice daily instead of IR, in case improvement in diarrhea.  - Continue Ozempic 1 mg weekly.  Hypertension: - Unknown home control, BP elevated at PT visit last week.  - Recommend to continue current regimen; discuss with PCP tomorrow. Monitor for orthostatic hypotension, though patient describes symptoms as vertigo. If BP remains elevated, agree with plan to increase spironolactone - Discussed importance of periodic home monitoring with patient.  - Will collaborate with PCP to send script of amlodipine to Pharmacy at [facility] for adherence packaging.   Hyperlipidemia/ASCVD Risk Reduction - Controlled per last lipid panel - Recommend to continue current regimen at this time  Chronic Pain and Insomnia - Uncontrolled on current regimen, patient reports taking baclofen 10 mg twice daily instead of 5. Will discuss with PCP.  - Continue remainder of prescribed medication at this time  OAB - Moderately well controlled per patient report - Recommended to continue current regimen at this time  COPD: - Uncontrolled. Will discuss tobacco cessation moving forward - Recommend to continue current regimen at this time  Follow Up Plan: phone call in 8 weeks  Catie Eppie Gibson, PharmD, BCACP, CPP Clinical Pharmacist [facility] [phone]

## 2023-06-15 NOTE — Patient Instructions (Signed)
Sharmeka,   It was great talking to you today!  Check your blood pressure twice weekly, and any time you have concerning symptoms like headache, chest pain, dizziness, shortness of breath, or vision changes.   Our goal is less than 140/90.  To appropriately check your blood pressure, make sure you do the following:  1) Avoid caffeine, exercise, or tobacco products for 30 minutes before checking. Empty your bladder. 2) Sit with your back supported in a flat-backed chair. Rest your arm on something flat (arm of the chair, table, etc). 3) Sit still with your feet flat on the floor, resting, for at least 5 minutes.  4) Check your blood pressure. Take 1-2 readings.  5) Write down these readings and bring with you to any provider appointments.  Bring your home blood pressure machine with you to a provider's office for accuracy comparison at least once a year.   Make sure you take your blood pressure medications before you come to any office visit, even if you were asked to fast for labs.   Take care!  Catie Eppie Gibson, PharmD, BCACP, CPP Clinical Pharmacist [facility] [phone]

## 2023-06-16 ENCOUNTER — Encounter: Payer: 59 | Admitting: Internal Medicine

## 2023-06-16 ENCOUNTER — Ambulatory Visit: Payer: 59 | Admitting: Physical Therapy

## 2023-06-17 DIAGNOSIS — E11621 Type 2 diabetes mellitus with foot ulcer: Secondary | ICD-10-CM | POA: Diagnosis not present

## 2023-06-17 DIAGNOSIS — T8189XA Other complications of procedures, not elsewhere classified, initial encounter: Secondary | ICD-10-CM | POA: Diagnosis not present

## 2023-06-19 ENCOUNTER — Other Ambulatory Visit: Payer: Self-pay

## 2023-06-19 ENCOUNTER — Other Ambulatory Visit (HOSPITAL_COMMUNITY): Payer: Self-pay

## 2023-06-19 ENCOUNTER — Ambulatory Visit (AMBULATORY_SURGERY_CENTER): Payer: 59

## 2023-06-19 VITALS — Ht 65.0 in | Wt 200.0 lb

## 2023-06-19 DIAGNOSIS — Z1211 Encounter for screening for malignant neoplasm of colon: Secondary | ICD-10-CM

## 2023-06-19 MED ORDER — NA SULFATE-K SULFATE-MG SULF 17.5-3.13-1.6 GM/177ML PO SOLN
1.0000 | Freq: Once | ORAL | 0 refills | Status: AC
Start: 2023-06-19 — End: 2023-06-23
  Filled 2023-06-19: qty 354, 1d supply, fill #0

## 2023-06-19 NOTE — Progress Notes (Signed)
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  Previsit completed and red dot placed by patient's name on their procedure day (on provider's schedule).     PV competed with patient. Prep instructions sent via mychart and home address. Rx sent to [facility] pharm pt gets meds delivered

## 2023-06-22 ENCOUNTER — Encounter: Payer: Self-pay | Admitting: Internal Medicine

## 2023-06-23 ENCOUNTER — Ambulatory Visit: Payer: 59 | Attending: Podiatry | Admitting: Physical Therapy

## 2023-06-23 VITALS — BP 118/71 | HR 84

## 2023-06-23 DIAGNOSIS — R2689 Other abnormalities of gait and mobility: Secondary | ICD-10-CM | POA: Insufficient documentation

## 2023-06-23 DIAGNOSIS — R2681 Unsteadiness on feet: Secondary | ICD-10-CM | POA: Insufficient documentation

## 2023-06-23 DIAGNOSIS — M6281 Muscle weakness (generalized): Secondary | ICD-10-CM | POA: Diagnosis not present

## 2023-06-23 DIAGNOSIS — R296 Repeated falls: Secondary | ICD-10-CM | POA: Insufficient documentation
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
## 2023-06-24 ENCOUNTER — Ambulatory Visit: Payer: 59 | Admitting: Podiatry

## 2023-06-24 DIAGNOSIS — Z89431 Acquired absence of right foot: Secondary | ICD-10-CM

## 2023-06-26 NOTE — Progress Notes (Signed)
Patient was scanned for orthotic and toe filler with footmaxx scanner

## 2023-06-26 NOTE — Progress Notes (Signed)
Patient is missing left ft and needs toe filler for Rt ft, all toes on rt ft has been amputated.

## 2023-06-30 ENCOUNTER — Ambulatory Visit: Payer: 59 | Admitting: Physical Therapy

## 2023-06-30 ENCOUNTER — Encounter: Payer: Self-pay | Admitting: Physical Therapy

## 2023-06-30 VITALS — BP 157/71 | HR 73

## 2023-06-30 DIAGNOSIS — R2689 Other abnormalities of gait and mobility: Secondary | ICD-10-CM

## 2023-06-30 DIAGNOSIS — R2681 Unsteadiness on feet: Secondary | ICD-10-CM | POA: Diagnosis not present

## 2023-06-30 DIAGNOSIS — M6281 Muscle weakness (generalized): Secondary | ICD-10-CM | POA: Diagnosis not present

## 2023-06-30 DIAGNOSIS — R296 Repeated falls: Secondary | ICD-10-CM | POA: Diagnosis not present
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
## 2023-07-02 ENCOUNTER — Other Ambulatory Visit: Payer: Self-pay

## 2023-07-02 DIAGNOSIS — G8929 Other chronic pain: Secondary | ICD-10-CM

## 2023-07-06 ENCOUNTER — Other Ambulatory Visit: Payer: Self-pay

## 2023-07-06 ENCOUNTER — Other Ambulatory Visit (HOSPITAL_COMMUNITY): Payer: Self-pay

## 2023-07-06 DIAGNOSIS — E1159 Type 2 diabetes mellitus with other circulatory complications: Secondary | ICD-10-CM | POA: Diagnosis not present

## 2023-07-06 NOTE — Telephone Encounter (Signed)
Please address, patient is out of medication.

## 2023-07-07 ENCOUNTER — Other Ambulatory Visit: Payer: Self-pay

## 2023-07-07 ENCOUNTER — Other Ambulatory Visit (HOSPITAL_COMMUNITY): Payer: Self-pay

## 2023-07-07 ENCOUNTER — Ambulatory Visit: Payer: 59 | Admitting: Physical Therapy

## 2023-07-07 ENCOUNTER — Encounter: Payer: Self-pay | Admitting: Physical Therapy

## 2023-07-07 MED ORDER — OXYCODONE HCL 5 MG PO TABS
5.0000 mg | ORAL_TABLET | ORAL | 0 refills | Status: DC | PRN
Start: 2023-07-07 — End: 2023-08-06

## 2023-07-07 NOTE — Therapy (Signed)
[facility] [address] [place], [place], [address] Phone: [phone]   Fax:  [phone]  Patient Details  Name: Jennifer [name] MRN: [id number] Date of Birth: February 19, 1961 Referring Provider:  No ref. provider found  Encounter Date: 07/07/2023  Called pt and left VM regarding no-show to today's PT appointment. Informed pt that today is her last scheduled appointment, so if she would like to continue PT, she will need to call the clinic and reschedule. Informed pt that if we do not hear back from her by the end of the week, we will assume she wants to DC from PT.    Jill Alexanders Ester Mabe, PT 07/07/2023, 2:29 PM   [facility] [address] [place], [place], [address] Phone: [phone]   Fax:  [phone]

## 2023-07-08 ENCOUNTER — Other Ambulatory Visit (HOSPITAL_COMMUNITY): Payer: Self-pay

## 2023-07-08 ENCOUNTER — Telehealth: Payer: Self-pay | Admitting: Internal Medicine

## 2023-07-08 ENCOUNTER — Other Ambulatory Visit: Payer: Self-pay

## 2023-07-08 DIAGNOSIS — M62838 Other muscle spasm: Secondary | ICD-10-CM

## 2023-07-08 DIAGNOSIS — K219 Gastro-esophageal reflux disease without esophagitis: Secondary | ICD-10-CM

## 2023-07-08 MED ORDER — PANTOPRAZOLE SODIUM 40 MG PO TBEC
40.0000 mg | DELAYED_RELEASE_TABLET | Freq: Every day | ORAL | 1 refills | Status: DC
Start: 2023-07-08 — End: 2023-07-14
  Filled 2023-07-08: qty 90, 90d supply, fill #0

## 2023-07-08 MED ORDER — BACLOFEN 10 MG PO TABS
5.0000 mg | ORAL_TABLET | Freq: Two times a day (BID) | ORAL | 0 refills | Status: DC
Start: 2023-07-08 — End: 2023-07-14
  Filled 2023-07-08: qty 60, 60d supply, fill #0

## 2023-07-08 NOTE — Telephone Encounter (Signed)
Left voice message for [name] at 5:00 p inquiring about the status of her blood sugars, having received message from the pharmacist provider that she was experiencing some lows.  I requested that she return a call to our office to report on her blood sugar status so that we can respond appropriately.

## 2023-07-10 ENCOUNTER — Ambulatory Visit: Payer: 59 | Admitting: Podiatry

## 2023-07-13 NOTE — Telephone Encounter (Signed)
Hey Dr.Williams you sent these rx to an alternative pharmacy, they were [facility] to [facility] [address].

## 2023-07-14 ENCOUNTER — Other Ambulatory Visit (HOSPITAL_COMMUNITY): Payer: Self-pay

## 2023-07-14 ENCOUNTER — Telehealth: Payer: Self-pay | Admitting: Internal Medicine

## 2023-07-14 ENCOUNTER — Other Ambulatory Visit: Payer: Self-pay

## 2023-07-14 ENCOUNTER — Other Ambulatory Visit: Payer: Self-pay | Admitting: Internal Medicine

## 2023-07-14 DIAGNOSIS — M62838 Other muscle spasm: Secondary | ICD-10-CM

## 2023-07-14 DIAGNOSIS — E119 Type 2 diabetes mellitus without complications: Secondary | ICD-10-CM

## 2023-07-14 DIAGNOSIS — K219 Gastro-esophageal reflux disease without esophagitis: Secondary | ICD-10-CM

## 2023-07-14 MED ORDER — METFORMIN HCL ER 500 MG PO TB24
500.0000 mg | ORAL_TABLET | Freq: Two times a day (BID) | ORAL | 3 refills | Status: DC
Start: 2023-07-14 — End: 2023-08-06
  Filled 2023-07-14: qty 180, 90d supply, fill #0

## 2023-07-14 MED ORDER — PANTOPRAZOLE SODIUM 40 MG PO TBEC
40.0000 mg | DELAYED_RELEASE_TABLET | Freq: Every day | ORAL | 3 refills | Status: DC
Start: 2023-07-14 — End: 2024-06-22
  Filled 2023-07-14: qty 90, 90d supply, fill #0
  Filled 2023-08-06 – 2023-08-10 (×2): qty 30, 30d supply, fill #0
  Filled 2023-09-04: qty 30, 30d supply, fill #1
  Filled 2023-10-01: qty 30, 30d supply, fill #2
  Filled 2023-11-02 – 2023-11-04 (×2): qty 30, 30d supply, fill #3
  Filled 2023-11-30 – 2023-12-01 (×2): qty 30, 30d supply, fill #4
  Filled 2023-12-15 – 2023-12-24 (×3): qty 30, 30d supply, fill #5
  Filled 2024-01-15 – 2024-01-19 (×2): qty 30, 30d supply, fill #6
  Filled 2024-02-04 – 2024-02-18 (×4): qty 30, 30d supply, fill #7
  Filled 2024-02-25 – 2024-03-14 (×2): qty 30, 30d supply, fill #8
  Filled 2024-04-06 – 2024-04-07 (×2): qty 30, 30d supply, fill #9
  Filled 2024-04-25 – 2024-05-03 (×3): qty 30, 30d supply, fill #10
  Filled 2024-06-16: qty 30, 30d supply, fill #11

## 2023-07-14 MED ORDER — BACLOFEN 5 MG PO TABS
5.0000 mg | ORAL_TABLET | Freq: Two times a day (BID) | ORAL | 3 refills | Status: DC
Start: 2023-07-14 — End: 2024-02-18
  Filled 2023-07-14: qty 180, 90d supply, fill #0
  Filled 2023-07-16: qty 60, 30d supply, fill #0
  Filled 2023-09-04: qty 60, 30d supply, fill #1
  Filled 2023-10-01: qty 60, 30d supply, fill #2
  Filled 2023-11-02 – 2023-11-04 (×2): qty 60, 30d supply, fill #3
  Filled 2023-11-30 – 2023-12-01 (×2): qty 60, 30d supply, fill #4
  Filled 2023-12-15 – 2023-12-24 (×3): qty 60, 30d supply, fill #5
  Filled 2024-01-15 – 2024-01-19 (×2): qty 60, 30d supply, fill #6
  Filled 2024-02-04 – 2024-02-16 (×3): qty 60, 30d supply, fill #7

## 2023-07-14 NOTE — Telephone Encounter (Signed)
Having not heard any replies of my vm mssg last week, I called again today at 2:30p to check on her well-being - she has missed a PT and a podiatry appt.  I also had a question about her pharmacy.  I requested that Jennifer [name] return a call to our office or reach out to me on MyChart so I can ensure her well-being.

## 2023-07-15 ENCOUNTER — Other Ambulatory Visit: Payer: Self-pay | Admitting: Internal Medicine

## 2023-07-15 ENCOUNTER — Other Ambulatory Visit (HOSPITAL_COMMUNITY): Payer: Self-pay

## 2023-07-15 ENCOUNTER — Other Ambulatory Visit: Payer: Self-pay

## 2023-07-15 DIAGNOSIS — Z794 Long term (current) use of insulin: Secondary | ICD-10-CM

## 2023-07-15 MED ORDER — TRESIBA FLEXTOUCH 200 UNIT/ML ~~LOC~~ SOPN
62.0000 [IU] | PEN_INJECTOR | Freq: Every day | SUBCUTANEOUS | 3 refills | Status: DC
Start: 2023-07-15 — End: 2023-08-06
  Filled 2023-07-15: qty 9, fill #0

## 2023-07-15 NOTE — Telephone Encounter (Addendum)
Called in response to Dr. Mayford Knife call: she states she is having low blood sugars when she wakes sometimes as low as 58, however she cannot find her CGM reader or her glucometer today. She is looking for them now.  She states she uses  [facility] and has her medicines delivered [facility]), except oxycodon is from [facility] on [address]. Taking 65 units Tresiba once a day (except tomorrow and Friday for her Colonoscopy this Friday she is taking less.)

## 2023-07-15 NOTE — Telephone Encounter (Signed)
Okay. Thank you! Called Ms Braver and told her to call us if she cannot find her meter and reminded her o fher appointment.

## 2023-07-15 NOTE — Telephone Encounter (Signed)
Patient called she stated her phone has been messed up but she is available to speak.

## 2023-07-16 ENCOUNTER — Other Ambulatory Visit (HOSPITAL_COMMUNITY): Payer: Self-pay

## 2023-07-16 ENCOUNTER — Other Ambulatory Visit: Payer: Self-pay

## 2023-07-17 ENCOUNTER — Ambulatory Visit (AMBULATORY_SURGERY_CENTER): Payer: 59 | Admitting: Internal Medicine

## 2023-07-17 ENCOUNTER — Encounter: Payer: Self-pay | Admitting: Internal Medicine

## 2023-07-17 VITALS — BP 134/68 | HR 80 | Temp 98.1°F | Resp 15 | Ht 65.0 in | Wt 200.0 lb

## 2023-07-17 DIAGNOSIS — Z800 Family history of malignant neoplasm of digestive organs: Secondary | ICD-10-CM | POA: Diagnosis not present

## 2023-07-17 DIAGNOSIS — I10 Essential (primary) hypertension: Secondary | ICD-10-CM | POA: Diagnosis not present

## 2023-07-17 DIAGNOSIS — Z1211 Encounter for screening for malignant neoplasm of colon: Secondary | ICD-10-CM

## 2023-07-17 MED ORDER — SODIUM CHLORIDE 0.9 % IV SOLN: 500.0000 mL | Freq: Once | INTRAVENOUS | Status: DC

## 2023-07-17 NOTE — Patient Instructions (Signed)
YOU HAD AN ENDOSCOPIC PROCEDURE TODAY AT THE [facility]:   Refer to the procedure report that was given to you for any specific questions about what was found during the examination.  If the procedure report does not answer your questions, please call your gastroenterologist to clarify.  If you requested that your care partner not be given the details of your procedure findings, then the procedure report has been included in a sealed envelope for you to review at your convenience later.  YOU SHOULD EXPECT: Some feelings of bloating in the abdomen. Passage of more gas than usual.  Walking can help get rid of the air that was put into your GI tract during the procedure and reduce the bloating. If you had a lower endoscopy (such as a colonoscopy or flexible sigmoidoscopy) you may notice spotting of blood in your stool or on the toilet paper. If you underwent a bowel prep for your procedure, you may not have a normal bowel movement for a few days.  Please Note:  You might notice some irritation and congestion in your nose or some drainage.  This is from the oxygen used during your procedure.  There is no need for concern and it should clear up in a day or so.  SYMPTOMS TO REPORT IMMEDIATELY:  Following lower endoscopy (colonoscopy or flexible sigmoidoscopy):  Excessive amounts of blood in the stool  Significant tenderness or worsening of abdominal pains  Swelling of the abdomen that is new, acute  Fever of 100F or higher  For urgent or emergent issues, a gastroenterologist can be reached at any hour by calling [phone]. Do not use MyChart messaging for urgent concerns.    DIET:  We do recommend a small meal at first, but then you may proceed to your regular diet.  Drink plenty of fluids but you should avoid alcoholic beverages for 24 hours.  ACTIVITY:  You should plan to take it easy for the rest of today and you should NOT DRIVE or use heavy machinery until tomorrow (because of  the sedation medicines used during the test).    FOLLOW UP: Our staff will call the number listed on your records the next business day following your procedure.  We will call around 7:15- 8:00 am to check on you and address any questions or concerns that you may have regarding the information given to you following your procedure. If we do not reach you, we will leave a message.     If any biopsies were taken you will be contacted by phone or by letter within the next 1-3 weeks.  Please call us at [phone] if you have not heard about the biopsies in 3 weeks.    SIGNATURES/CONFIDENTIALITY: You and/or your care partner have signed paperwork which will be entered into your electronic medical record.  These signatures attest to the fact that that the information above on your After Visit Summary has been reviewed and is understood.  Full responsibility of the confidentiality of this discharge information lies with you and/or your care-partner.  

## 2023-07-17 NOTE — Progress Notes (Signed)
Report to PACU, RN, vss, BBS= Clear.  

## 2023-07-17 NOTE — Op Note (Signed)
[facility] Patient Name: Jennifer [name] Procedure Date: 07/17/2023 9:43 AM MRN: [id number] Endoscopist: Beverley Fiedler , MD, [id number] Age: 62 Referring MD:  Date of Birth: [birth year] Gender: Female Account #: [id number] Procedure:                Colonoscopy Indications:              Screening in patient at increased risk: Family                            history of 1st-degree relative with colorectal                            cancer, Last colonoscopy: 2012 Medicines:                Monitored Anesthesia Care Procedure:                Pre-Anesthesia Assessment:                           - Prior to the procedure, a History and Physical                            was performed, and patient medications and                            allergies were reviewed. The patient's tolerance of                            previous anesthesia was also reviewed. The risks                            and benefits of the procedure and the sedation                            options and risks were discussed with the patient.                            All questions were answered, and informed consent                            was obtained. Prior Anticoagulants: The patient has                            taken no anticoagulant or antiplatelet agents. ASA                            Grade Assessment: III - A patient with severe                            systemic disease. After reviewing the risks and                            benefits, the patient was deemed in satisfactory  condition to undergo the procedure.                           After obtaining informed consent, the colonoscope                            was passed under direct vision. Throughout the                            procedure, the patient's blood pressure, pulse, and                            oxygen saturations were monitored continuously. The                            CF [id number] #[id number] was  introduced through the anus                            and advanced to the cecum, identified by                            transillumination. The colonoscopy was performed                            without difficulty. The patient tolerated the                            procedure well. The quality of the bowel                            preparation was poor. The ileocecal valve,                            appendiceal orifice, and rectum were photographed. Scope In: 10:12:31 AM Scope Out: 10:22:49 AM Scope Withdrawal Time: 0 hours 5 minutes 6 seconds  Total Procedure Duration: 0 hours 10 minutes 18 seconds  Findings:                 The digital rectal exam was normal.                           Multiple medium-mouthed and small-mouthed                            diverticula were found in the sigmoid colon and                            descending colon. Complications:            No immediate complications. Estimated Blood Loss:     Estimated blood loss: none. Impression:               - Preparation of the colon was poor. This limited                            complete exam throughout the colon.                           -  Mild diverticulosis in the sigmoid colon and in                            the descending colon.                           - No specimens collected. Recommendation:           - Patient has a contact number available for                            emergencies. The signs and symptoms of potential                            delayed complications were discussed with the                            patient. Return to normal activities tomorrow.                            Written discharge instructions were provided to the                            patient.                           - Resume previous diet.                           - Continue present medications.                           - Repeat colonoscopy at the next available                            appointment with  2 day prep because the bowel                            preparation was poor. Beverley Fiedler, MD 07/17/2023 10:29:10 AM This report has been signed electronically.

## 2023-07-17 NOTE — Progress Notes (Signed)
GASTROENTEROLOGY PROCEDURE H&P NOTE   Primary Care Physician: Miguel Aschoff, MD    Reason for Procedure:  Colon cancer screening  Plan:    Colonoscopy  Patient is appropriate for endoscopic procedure(s) in the ambulatory ([facility]) setting.  The nature of the procedure, as well as the risks, benefits, and alternatives were carefully and thoroughly reviewed with the patient. Ample time for discussion and questions allowed. The patient understood, was satisfied, and agreed to proceed.     HPI: Jennifer [name] is a 62 y.o. female who presents for colonoscopy.  Medical history as below.  Tolerated the prep.  No recent chest pain or shortness of breath.  No abdominal pain today.  Past Medical History:  Diagnosis Date   Acute vestibular syndrome, resolved 03/02/2021   Angioedema 06/14/2021   Asthma    Burn of finger of right hand, second degree 02/05/2021   Occurred during cooking (frying), poor sensation due to neuropathy, pt punctured blister to allow it to drain, skin has since desquamated over dorsal joint, no infection.  Keep clean and dry, OTC antibacterial ointment.   Cataract    CHF (congestive heart failure) (HCC)    Chronic bronchitis (HCC)    "I get it alot" (09/28/2013)   Chronic diastolic heart failure (HCC)    grade 2 per 2D echocardiogram (01/2013)   Chronic kidney disease    Chronic lower back pain    Chronic pain syndrome 12/03/2011   Likely secondary to depression, "fibromyalgia", neuropathy, and obesity. Lumbar MRI 2014 no sig change from prior (2008) : Stable hypertrophic facet disease most notable at L4-5. Stable shallow left foraminal/extraforaminal disc protrusion at L4-5. No direct neural compression.       Chronic right shoulder pain 10/10/2021   COPD 01/08/2007   PFT's 05/2007 : FEV1/FVC 82, FEV1 64% pred, FEF 25-75% 40% predicted, 16% improvement in FEV1 with bronchodilators.      Depression    Diabetic peripheral neuropathy (HCC)    Dizziness,  resolved (admitted with vestibular migraine)    DVT of upper extremity (deep vein thrombosis) (HCC) 03/11/2013   Secondary to PICC line. Right brachial vein, diagnosed on 03/10/2013 Coumadin for 3 months. 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  S. Aureus and GBS.    History of use of hearing aid    Hyperlipidemia    Hyperplastic colon polyp 12/2010   Per colonoscopy (12/2010) - Dr. 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       Initially evaluated 08/2019 with dedicated MRI: "IMPRESSION:  Skin marker overlies a 9 x 10 x 11 mm cyst within the anterior  aspect of the superficial lobe of the right  parotid gland. This is  presumed to be a benign cyst. The possibility of a cystic Warthin's  tumor   Pneumonia    PVD (peripheral vascular disease) with claudication (HCC)    Stents to bilateral common iliac arteries (left 2005, right 2008), on chronic plavix   Pyelonephritis 10/28/2020   S/P BKA (below knee amputation) unilateral (HCC)    2014 L - failed limb preserving treatment. 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  Evaluated on 07/08/2021 by podiatrist Dr. Logan Bores who debrided necrotic tissue and prescribed doxycycline.  He will see her again in 3 weeks.  The location of this ulcer on the dorsal aspect of the toe is somewhat atypical for a purely diabetic foot wound, and she does have a strong DP pulse.  I did not examine h   Type II diabetes mellitus with peripheral circulatory disorders, uncontrolled DX: 1993   Insulin dep. Poor control. Complicated by diabetic foot ulcer and diabetic eye disease.     Upper respiratory tract infection due to COVID-19 virus 07/22/2022   Urinary incontinence, severe, mixed (stress, urge, functional) 02/14/2023   Unresponsive to medications    Past Surgical History:  Procedure Laterality Date   ABDOMINAL HYSTERECTOMY  1997   secondary to uterine fibroids   AMPUTATION Left 08/31/2013   Procedure: AMPUTATION RAY;  Surgeon: Nadara Mustard, MD;  Location: [facility] OR;  Service: Orthopedics;  Laterality: Left;  Left Foot 5th Ray Amputation   AMPUTATION Left 09/28/2013   Procedure: Left Midfoot amputation;  Surgeon: Nadara Mustard, MD;  Location: [facility] OR;  Service: Orthopedics;  Laterality: Left;  Left Midfoot amputation   AMPUTATION Left 10/14/2013   Procedure: AMPUTATION BELOW KNEE- left;  Surgeon: Nadara Mustard, MD;  Location: [facility] OR;  Service: Orthopedics;  Laterality: Left;  Left Below Knee Amputation    AMPUTATION TOE Right 01/15/2017   Procedure: AMPUTATION 5th TOE RIGHT FOOT;  Surgeon: Felecia Shelling, DPM;  Location: [facility] OR;  Service: Podiatry;  Laterality: Right;   AMPUTATION TOE Right 02/12/2023   Procedure: Right Foot Transmetatarsal Amputation;  Surgeon: [name], DPM;  Location: [facility] OR;  Service: Podiatry;  Laterality: Right;   APPLICATION OF WOUND VAC  04/01/2019   Procedure: Application Of Wound Vac;  Surgeon: Nadara Mustard, MD;  Location: [facility] OR;  Service: Orthopedics;;   BLADDER  SURGERY     bladder reconstruction surgery   BOTOX INJECTION N/A 08/21/2021   Procedure: CYSTOSCOPY BOTOX INJECTION;  Surgeon: Crista Elliot, MD;  Location: [facility] ORS;  Service: Urology;  Laterality: N/A;   BREAST BIOPSY     multiple-benign per pt   CATARACT EXTRACTION, BILATERAL     summer 2022   COLONOSCOPY     DILATION AND CURETTAGE OF UTERUS  1985   ESOPHAGOGASTRODUODENOSCOPY N/A 09/20/2013   Procedure: ESOPHAGOGASTRODUODENOSCOPY (EGD);  Surgeon: Beverley Fiedler, MD;  Location: [facility] ENDOSCOPY;  Service: Gastroenterology;  Laterality: N/A;   EYE SURGERY Bilateral 2022   Cataract removal in June and then July   FOOT AMPUTATION THROUGH METATARSAL Left 09/28/2013   GANGLION CYST EXCISION     multiple   PAROTIDECTOMY Right 05/14/2022   Procedure: PAROTIDECTOMY;  Surgeon: Christia Reading, MD;  Location: [facility] OR;  Service: ENT;  Laterality: Right;   PERIPHERAL VASCULAR INTERVENTION     stents in lower ext   PREAURICULAR CYST EXCISION N/A 05/14/2022   Procedure: EXCISION OF SCALP SKIN CYST, 1.5cm;  Surgeon: Christia Reading, MD;  Location: [facility] OR;  Service: ENT;  Laterality: N/A;   SHOULDER ARTHROSCOPY Right 11/11/2019   RIGHT SHOULDER ARTHROSCOPY AND DEBRIDEMENT    SHOULDER ARTHROSCOPY Right 11/11/2019   Procedure: RIGHT SHOULDER ARTHROSCOPY AND DEBRIDEMENT;  Surgeon: Nadara Mustard, MD;  Location: [facility] OR;  Service: Orthopedics;  Laterality: Right;   SHOULDER ARTHROSCOPY W/ ROTATOR CUFF REPAIR Bilateral    2 on right one on left   SKIN SPLIT GRAFT Bilateral 05/13/2013   Procedure: Right and Left Foot Allograft Skin Graft;  Surgeon: Nadara Mustard, MD;  Location: [facility] OR;  Service: Orthopedics;  Laterality: Bilateral;  Right and Left Foot Allograft Skin Graft   STUMP REVISION Left 04/01/2019   Procedure: REVISION LEFT BELOW KNEE AMPUTATION;  Surgeon: Nadara Mustard, MD;  Location: [facility] OR;  Service: Orthopedics;  Laterality: Left;   TEE WITHOUT CARDIOVERSION N/A 01/31/2013   Procedure: TRANSESOPHAGEAL  ECHOCARDIOGRAM (TEE);  Surgeon: Pricilla Riffle, MD;  Location: [facility] ENDOSCOPY;  Service: Cardiovascular;  Laterality: N/A;  Rm [id number]   TEE WITHOUT CARDIOVERSION N/A 03/10/2013   Procedure: TRANSESOPHAGEAL ECHOCARDIOGRAM (TEE);  Surgeon: Laurey Morale, MD;  Location: [facility] ENDOSCOPY;  Service: Cardiovascular;  Laterality: N/A;  Rm. [id number]   TOE AMPUTATION Left 08/31/2013   4TH & 5 TH TOE    TONSILLECTOMY     TUBAL LIGATION     WRIST SURGERY Right    "for tumors" (09/28/2013)    Prior to Admission medications   Medication Sig Start Date End Date Taking? Authorizing Provider  albuterol (PROAIR HFA) 108 (90 Base) MCG/ACT inhaler INHALE 2 PUFFS BY MOUTH EVERY 6 HOURS AS NEEDED FOR WHEEZING 10/27/22  Yes Miguel Aschoff, MD  amLODipine (NORVASC) 10 MG tablet Take 1 tablet (10 mg total) by mouth daily. 10/23/22  Yes Miguel Aschoff, MD  aspirin EC 81 MG tablet Take 1 tablet (81 mg total) by mouth daily. 09/19/21  Yes Miguel Aschoff, MD  Continuous Glucose Receiver (FREESTYLE LIBRE 3 READER) DEVI Use to continuously monitor blood glucose 04/02/23  Yes Miguel Aschoff, MD  Continuous Glucose Sensor (FREESTYLE LIBRE 3 SENSOR) MISC Place 1 sensor on the skin every 14 days. Use to check glucose continuously 03/31/23  Yes Miguel Aschoff, MD  DULoxetine (CYMBALTA) 30 MG capsule Take 1 capsule (30 mg total) by mouth 2 (two) times daily. 04/06/23  Yes Miguel Aschoff, MD  glucose blood test strip Use 3 (three) times daily. 02/16/23  Yes Mapp, Tavien, MD  insulin degludec (TRESIBA FLEXTOUCH) 200 UNIT/ML FlexTouch Pen Inject 62 Units into the skin daily. 07/15/23  Yes Miguel Aschoff, MD  Insulin Pen Needle 32G X 4 MM MISC Use to inject insulin 4 (four) times daily. 04/06/23  Yes Miguel Aschoff, MD  Lancets MISC Use as directed 10/27/22  Yes Miguel Aschoff, MD  metFORMIN (GLUCOPHAGE-XR) 500 MG 24 hr tablet Take 1 tablet (500 mg total) by mouth 2 (two) times daily with a meal.  07/14/23  Yes Miguel Aschoff, MD  metoprolol succinate (TOPROL-XL) 100 MG 24 hr tablet Take 1 tablet (100 mg total) by mouth daily. TAKE WITH OR IMMEDIATELY FOLLOWING A MEAL. 04/06/23  Yes Miguel Aschoff, MD  Multiple Vitamin (MULTIVITAMIN) tablet Take 1 tablet by mouth daily.  Yes [provider]  MYRBETRIQ 50 MG TB24 tablet Take 1 tablet (50 mg total) by mouth daily. 04/06/23  Yes Miguel Aschoff, MD  oxyCODONE (OXY IR/ROXICODONE) 5 MG immediate release tablet Take 1 tablet (5 mg total) by mouth every 4 (four) hours as needed for severe pain. 07/07/23  Yes Miguel Aschoff, MD  pantoprazole (PROTONIX) 40 MG tablet Take 1 tablet (40 mg total) by mouth daily. 07/14/23  Yes Miguel Aschoff, MD  pregabalin (LYRICA) 200 MG capsule Take 1 capsule (200 mg total) by mouth 3 (three) times daily. 04/06/23  Yes Miguel Aschoff, MD  rosuvastatin (CRESTOR) 20 MG tablet Take 1 tablet (20 mg total) by mouth in the morning. 04/06/23  Yes Miguel Aschoff, MD  spironolactone (ALDACTONE) 25 MG tablet Take 1 tablet (25 mg total) by mouth daily. 04/01/23  Yes Miguel Aschoff, MD  umeclidinium-vilanterol [facility]) 62.5-25 MCG/ACT AEPB Inhale 1 puff into the lungs in the morning. 06/12/23  Yes Miguel Aschoff, MD  acetaminophen (TYLENOL) 650 MG CR tablet Take 2 tablets (1,300 mg total) by mouth every 8 (eight) hours as needed for pain. 02/16/23   Mapp, Gaylyn Cheers, MD  Baclofen 5 MG TABS Take 1 tablet (5 mg total) by mouth 2 (two) times daily. 07/14/23   Miguel Aschoff, MD  camphor-menthol [facility]) lotion Apply topically as needed for itching. 02/16/23   Mapp, Gaylyn Cheers, MD  ondansetron (ZOFRAN-ODT) 4 MG disintegrating tablet Take 1 tablet (4 mg total) by mouth every 8 (eight) hours as needed for nausea or vomiting. 02/18/23   Miguel Aschoff, MD  OVER THE COUNTER MEDICATION Take 1 tablet by mouth daily. Brain Health otc supplement Patient not taking: Reported on  06/19/2023    [provider]  oxybutynin (DITROPAN-XL) 10 MG 24 hr tablet Take 1 tablet (10 mg total) by mouth daily. 04/06/23   Miguel Aschoff, MD  Semaglutide, 1 MG/DOSE, (OZEMPIC, 1 MG/DOSE,) 4 MG/3ML SOPN Inject 1 mg into the skin once a week. 12/25/22   Miguel Aschoff, MD  senna-docusate (SENOKOT-S) 8.6-50 MG tablet Take 1 tablet by mouth at bedtime. Patient not taking: Reported on 04/01/2023 02/16/23   Karoline Caldwell, MD  traZODone (DESYREL) 100 MG tablet Take 2 tablets (200 mg total) by mouth at bedtime as needed for sleep. Patient not taking: Reported on 06/15/2023 10/23/22   Miguel Aschoff, MD  Potassium Chloride ER 20 MEQ TBCR Take 40 mEq by mouth daily. 01/02/14 01/23/14  Belia Heman, MD    Current Outpatient Medications  Medication Sig Dispense Refill   albuterol (PROAIR HFA) 108 (90 Base) MCG/ACT inhaler INHALE 2 PUFFS BY MOUTH EVERY 6 HOURS AS NEEDED FOR WHEEZING 54 g 3   amLODipine (NORVASC) 10 MG tablet Take 1 tablet (10 mg total) by mouth daily. 90 tablet 3   aspirin EC 81 MG tablet Take 1 tablet (81 mg total) by mouth daily. 30 tablet    Continuous Glucose Receiver (FREESTYLE LIBRE 3 READER) DEVI Use to continuously monitor blood glucose 1 each 1   Continuous Glucose Sensor (FREESTYLE LIBRE 3 SENSOR) MISC Place 1 sensor on the skin every 14 days. Use to check glucose continuously 6 each 3   DULoxetine (CYMBALTA) 30 MG capsule Take 1 capsule (30 mg total) by mouth 2 (two) times daily. 180 capsule 1   glucose blood test strip Use 3 (three) times daily. 500 strip 0   insulin degludec (TRESIBA FLEXTOUCH) 200 UNIT/ML FlexTouch Pen Inject 62  Units into the skin daily. 9 mL 3   Insulin Pen Needle 32G X 4 MM MISC Use to inject insulin 4 (four) times daily. 100 each 10   Lancets MISC Use as directed 100 each 5   metFORMIN (GLUCOPHAGE-XR) 500 MG 24 hr tablet Take 1 tablet (500 mg total) by mouth 2 (two) times daily with a meal. 180 tablet 3   metoprolol succinate  (TOPROL-XL) 100 MG 24 hr tablet Take 1 tablet (100 mg total) by mouth daily. TAKE WITH OR IMMEDIATELY FOLLOWING A MEAL. 90 tablet 3   Multiple Vitamin (MULTIVITAMIN) tablet Take 1 tablet by mouth daily.     MYRBETRIQ 50 MG TB24 tablet Take 1 tablet (50 mg total) by mouth daily. 90 tablet 3   oxyCODONE (OXY IR/ROXICODONE) 5 MG immediate release tablet Take 1 tablet (5 mg total) by mouth every 4 (four) hours as needed for severe pain. 120 tablet 0   pantoprazole (PROTONIX) 40 MG tablet Take 1 tablet (40 mg total) by mouth daily. 90 tablet 3   pregabalin (LYRICA) 200 MG capsule Take 1 capsule (200 mg total) by mouth 3 (three) times daily. 270 capsule 1   rosuvastatin (CRESTOR) 20 MG tablet Take 1 tablet (20 mg total) by mouth in the morning. 90 tablet 3   spironolactone (ALDACTONE) 25 MG tablet Take 1 tablet (25 mg total) by mouth daily. 30 tablet 5   umeclidinium-vilanterol (ANORO ELLIPTA) 62.5-25 MCG/ACT AEPB Inhale 1 puff into the lungs in the morning. 180 each 11   acetaminophen (TYLENOL) 650 MG CR tablet Take 2 tablets (1,300 mg total) by mouth every 8 (eight) hours as needed for pain. 30 tablet 0   Baclofen 5 MG TABS Take 1 tablet (5 mg total) by mouth 2 (two) times daily. 180 tablet 3   camphor-menthol (SARNA) lotion Apply topically as needed for itching. 222 mL 0   ondansetron (ZOFRAN-ODT) 4 MG disintegrating tablet Take 1 tablet (4 mg total) by mouth every 8 (eight) hours as needed for nausea or vomiting. 20 tablet 0   OVER THE COUNTER MEDICATION Take 1 tablet by mouth daily. Brain Health otc supplement (Patient not taking: Reported on 06/19/2023)     oxybutynin (DITROPAN-XL) 10 MG 24 hr tablet Take 1 tablet (10 mg total) by mouth daily. 90 tablet 1   Semaglutide, 1 MG/DOSE, (OZEMPIC, 1 MG/DOSE,) 4 MG/3ML SOPN Inject 1 mg into the skin once a week. 3 mL 12   senna-docusate (SENOKOT-S) 8.6-50 MG tablet Take 1 tablet by mouth at bedtime. (Patient not taking: Reported on 04/01/2023) 30 tablet 0    traZODone (DESYREL) 100 MG tablet Take 2 tablets (200 mg total) by mouth at bedtime as needed for sleep. (Patient not taking: Reported on 06/15/2023) 60 tablet 11   Current Facility-Administered Medications  Medication Dose Route Frequency Provider Last Rate Last Admin   0.9 %  sodium chloride infusion  500 mL Intravenous Once Adriana Lina, Carie Caddy, MD        Allergies as of 07/17/2023 - Review Complete 07/17/2023  Allergen Reaction Noted   Benazepril Swelling 06/14/2021   Chantix [varenicline] Other (See Comments) 05/05/2022   Ioversol  02/10/2023   Morphine sulfate  02/10/2023   Pineapple Swelling 08/13/2021   Abilify [aripiprazole] Other (See Comments) 11/09/2015   Iohexol Other (See Comments) 08/17/2007   Ivp dye [iodinated contrast media] Other (See Comments) 05/10/2013    Family History  Problem Relation Age of Onset   Diverticulosis Mother    Diabetes Mother  Hypertension Mother    Congestive Heart Failure Mother    Asthma Father    CAD Sister 57       MI at age 16 per patient.  However, she has not had a stent or CABG.    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   Vaping Use   Vaping status: Never Used  Substance and Sexual Activity   Alcohol use: No     Alcohol/week: 0.0 standard drinks of alcohol   Drug use: No    Types: Marijuana, "Crack" cocaine    Comment: 09/28/2013 "no marijuana since 2011, no crack/cocaine 1989"   Sexual activity: Not Currently  Other Topics Concern   Not on file  Social History Narrative   On disability. Lives with son in [place]. 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    Boyfriend passed away stage 4 cancer 03-11-13.   S/p L BKA 2014. In wheelchair in paritially suitable apartment.    Social Determinants of Health   Financial Resource Strain: High Risk (03/31/2023)   Overall Financial Resource Strain (CARDIA)    Difficulty of Paying Living Expenses: Hard  Food Insecurity: Food Insecurity Present (03/31/2023)   Hunger Vital Sign    Worried About Running Out of Food in the Last Year: Often true    Ran Out of Food in the Last Year: Often true  Transportation Needs: Unmet Transportation Needs (03/31/2023)   PRAPARE - Administrator, Civil Service (Medical): Yes    Lack of Transportation (Non-Medical): Yes  Physical Activity: Inactive (03/31/2023)   Exercise Vital Sign    Days of Exercise per Week: 0 days    Minutes of Exercise per Session: 0 min  Stress: Stress Concern Present (03/31/2023)   [employer] of Occupational Health - Occupational Stress Questionnaire    Feeling of Stress : Very much  Social Connections: Moderately Integrated (03/31/2023)   Social Connection and Isolation Panel [NHANES]    Frequency of Communication with Friends and Family: More than three times a week    Frequency of Social Gatherings with Friends and Family: Once a week    Attends Religious Services: 1 to 4 times per year    Active Member of [employer] or Organizations: Yes    Attends Banker Meetings: 1 to 4 times per year    Marital Status: Divorced  Intimate Partner Violence: Not At Risk (03/31/2023)   Humiliation, Afraid, Rape, and Kick questionnaire    Fear of Current or Ex-Partner: No    Emotionally Abused: No  Physically Abused: No    Sexually Abused: No    Physical Exam: Vital signs in last 24 hours: @BP  (!) 141/76   Pulse 75   Temp 98.1 F (36.7 C) (Temporal)   Ht 5\' 5"  (1.651 m)   Wt 200 lb (90.7 kg)   SpO2 95%   BMI 33.28 kg/m  GEN: NAD EYE: Sclerae anicteric ENT: MMM CV: Non-tachycardic Pulm: CTA b/l GI: Soft, NT/ND NEURO:  Alert & Oriented x 3   Erick Blinks, MD [facility]  07/17/2023 10:07 AM

## 2023-07-17 NOTE — Progress Notes (Signed)
VS completed by CW.   Pt's states no medical or surgical changes since previsit or office visit.  

## 2023-07-20 ENCOUNTER — Telehealth: Payer: Self-pay

## 2023-07-20 NOTE — Telephone Encounter (Signed)
Left message on follow up call. 

## 2023-08-06 ENCOUNTER — Other Ambulatory Visit: Payer: Self-pay

## 2023-08-06 ENCOUNTER — Ambulatory Visit (INDEPENDENT_AMBULATORY_CARE_PROVIDER_SITE_OTHER): Payer: 59 | Admitting: Internal Medicine

## 2023-08-06 ENCOUNTER — Other Ambulatory Visit (HOSPITAL_COMMUNITY): Payer: Self-pay

## 2023-08-06 VITALS — BP 172/77 | HR 73 | Temp 98.1°F | Ht 65.0 in | Wt 210.3 lb

## 2023-08-06 DIAGNOSIS — N182 Chronic kidney disease, stage 2 (mild): Secondary | ICD-10-CM | POA: Diagnosis not present

## 2023-08-06 DIAGNOSIS — E1159 Type 2 diabetes mellitus with other circulatory complications: Secondary | ICD-10-CM | POA: Diagnosis not present

## 2023-08-06 DIAGNOSIS — I872 Venous insufficiency (chronic) (peripheral): Secondary | ICD-10-CM | POA: Diagnosis not present

## 2023-08-06 DIAGNOSIS — I152 Hypertension secondary to endocrine disorders: Secondary | ICD-10-CM

## 2023-08-06 DIAGNOSIS — F17210 Nicotine dependence, cigarettes, uncomplicated: Secondary | ICD-10-CM

## 2023-08-06 DIAGNOSIS — G8929 Other chronic pain: Secondary | ICD-10-CM

## 2023-08-06 DIAGNOSIS — Z794 Long term (current) use of insulin: Secondary | ICD-10-CM

## 2023-08-06 DIAGNOSIS — R112 Nausea with vomiting, unspecified: Secondary | ICD-10-CM

## 2023-08-06 DIAGNOSIS — E1122 Type 2 diabetes mellitus with diabetic chronic kidney disease: Secondary | ICD-10-CM | POA: Diagnosis not present

## 2023-08-06 DIAGNOSIS — J449 Chronic obstructive pulmonary disease, unspecified: Secondary | ICD-10-CM

## 2023-08-06 DIAGNOSIS — R159 Full incontinence of feces: Secondary | ICD-10-CM

## 2023-08-06 DIAGNOSIS — E119 Type 2 diabetes mellitus without complications: Secondary | ICD-10-CM

## 2023-08-06 DIAGNOSIS — Z89431 Acquired absence of right foot: Secondary | ICD-10-CM | POA: Diagnosis not present

## 2023-08-06 DIAGNOSIS — K635 Polyp of colon: Secondary | ICD-10-CM

## 2023-08-06 DIAGNOSIS — F5104 Psychophysiologic insomnia: Secondary | ICD-10-CM

## 2023-08-06 DIAGNOSIS — K529 Noninfective gastroenteritis and colitis, unspecified: Secondary | ICD-10-CM

## 2023-08-06 LAB — POCT GLYCOSYLATED HEMOGLOBIN (HGB A1C): Hemoglobin A1C: 6.7 % — AB (ref 4.0–5.6)

## 2023-08-06 LAB — GLUCOSE, CAPILLARY: Glucose-Capillary: 140 mg/dL — ABNORMAL HIGH (ref 70–99)

## 2023-08-06 MED ORDER — UMECLIDINIUM-VILANTEROL 62.5-25 MCG/ACT IN AEPB
1.0000 | INHALATION_SPRAY | Freq: Every morning | RESPIRATORY_TRACT | 11 refills | Status: AC
Start: 2023-08-06 — End: ?
  Filled 2023-08-06 – 2023-09-04 (×2): qty 120, 60d supply, fill #0
  Filled 2023-11-02 – 2023-11-04 (×2): qty 60, 30d supply, fill #1
  Filled 2023-12-02: qty 60, 30d supply, fill #2
  Filled 2023-12-30: qty 60, 30d supply, fill #3
  Filled 2024-01-27: qty 60, 30d supply, fill #4
  Filled 2024-02-23: qty 60, 30d supply, fill #5
  Filled 2024-03-22: qty 60, 30d supply, fill #6
  Filled 2024-04-19: qty 60, 30d supply, fill #7
  Filled 2024-05-16: qty 60, 30d supply, fill #8
  Filled 2024-06-13: qty 60, 30d supply, fill #9
  Filled 2024-07-11: qty 60, 30d supply, fill #10

## 2023-08-06 MED ORDER — LOPERAMIDE HCL 2 MG PO CAPS: 2.0000 mg | ORAL_CAPSULE | ORAL | Status: DC | PRN

## 2023-08-06 MED ORDER — OZEMPIC (1 MG/DOSE) 4 MG/3ML ~~LOC~~ SOPN
1.0000 mg | PEN_INJECTOR | SUBCUTANEOUS | 12 refills | Status: AC
Start: 2023-08-06 — End: ?
  Filled 2023-08-06: qty 3, 28d supply, fill #0
  Filled 2023-09-04: qty 3, 28d supply, fill #1
  Filled 2023-09-29: qty 3, 28d supply, fill #2
  Filled 2023-10-27: qty 3, 28d supply, fill #3
  Filled 2023-11-24: qty 3, 28d supply, fill #4
  Filled 2023-12-15: qty 3, 28d supply, fill #5
  Filled 2024-01-12: qty 3, 28d supply, fill #6
  Filled 2024-02-04 – 2024-02-16 (×3): qty 3, 28d supply, fill #7
  Filled 2024-03-08: qty 3, 28d supply, fill #8
  Filled 2024-04-05: qty 3, 28d supply, fill #9
  Filled 2024-05-03: qty 3, 28d supply, fill #10
  Filled 2024-05-24: qty 3, 28d supply, fill #11
  Filled 2024-06-21: qty 3, 28d supply, fill #12

## 2023-08-06 MED ORDER — METOCLOPRAMIDE HCL 5 MG PO TABS
5.0000 mg | ORAL_TABLET | Freq: Three times a day (TID) | ORAL | 1 refills | Status: DC
Start: 2023-08-06 — End: 2023-10-01
  Filled 2023-08-06 – 2023-08-10 (×2): qty 90, 30d supply, fill #0
  Filled 2023-09-04: qty 90, 30d supply, fill #1

## 2023-08-06 MED ORDER — TRAZODONE HCL 100 MG PO TABS
100.0000 mg | ORAL_TABLET | Freq: Every evening | ORAL | 11 refills | Status: DC | PRN
  Filled 2023-08-06: qty 30, 30d supply, fill #0
  Filled 2023-09-04: qty 30, 30d supply, fill #1
  Filled 2023-10-01: qty 30, 30d supply, fill #2
  Filled 2023-12-01: qty 30, 30d supply, fill #3
  Filled 2023-12-15 – 2023-12-24 (×2): qty 30, 30d supply, fill #4
  Filled 2024-01-15 – 2024-01-19 (×2): qty 30, 30d supply, fill #5
  Filled 2024-02-04 – 2024-02-16 (×3): qty 30, 30d supply, fill #6

## 2023-08-06 MED ORDER — TRESIBA FLEXTOUCH 200 UNIT/ML ~~LOC~~ SOPN
58.0000 [IU] | PEN_INJECTOR | Freq: Every day | SUBCUTANEOUS | 3 refills | Status: DC
  Filled 2023-08-06: qty 9, 31d supply, fill #0
  Filled 2023-09-04: qty 9, 31d supply, fill #1
  Filled 2023-10-02: qty 9, 31d supply, fill #2
  Filled 2023-11-02: qty 9, 31d supply, fill #3

## 2023-08-06 MED ORDER — METFORMIN HCL ER 500 MG PO TB24
500.0000 mg | ORAL_TABLET | Freq: Every day | ORAL | 3 refills | Status: DC
Start: 2023-08-06 — End: 2024-08-10
  Filled 2023-08-06 – 2023-08-10 (×2): qty 30, 30d supply, fill #0
  Filled 2023-09-04: qty 30, 30d supply, fill #1
  Filled 2023-10-01: qty 30, 30d supply, fill #2
  Filled 2023-11-02 – 2023-11-04 (×2): qty 30, 30d supply, fill #3
  Filled 2023-11-30 – 2023-12-01 (×2): qty 30, 30d supply, fill #4
  Filled 2023-12-15 – 2023-12-24 (×3): qty 30, 30d supply, fill #5
  Filled 2024-01-15 – 2024-01-19 (×2): qty 30, 30d supply, fill #6
  Filled 2024-02-04 – 2024-02-18 (×4): qty 30, 30d supply, fill #7
  Filled 2024-02-25 – 2024-03-14 (×2): qty 30, 30d supply, fill #8
  Filled 2024-04-06 – 2024-04-07 (×2): qty 30, 30d supply, fill #9
  Filled 2024-04-25 – 2024-05-03 (×3): qty 30, 30d supply, fill #10
  Filled 2024-06-16: qty 30, 30d supply, fill #11
  Filled 2024-07-19: qty 30, 30d supply, fill #12

## 2023-08-06 MED ORDER — SPIRONOLACTONE 25 MG PO TABS
50.0000 mg | ORAL_TABLET | Freq: Every day | ORAL | 5 refills | Status: DC
Start: 2023-08-06 — End: 2023-11-04
  Filled 2023-08-06: qty 60, 30d supply, fill #0
  Filled 2023-09-04: qty 60, 30d supply, fill #1
  Filled 2023-10-01: qty 60, 30d supply, fill #2

## 2023-08-06 MED ORDER — OXYCODONE HCL 5 MG PO TABS
5.0000 mg | ORAL_TABLET | ORAL | 0 refills | Status: DC | PRN
Start: 2023-08-06 — End: 2023-08-07
  Filled 2023-08-06: qty 120, 20d supply, fill #0

## 2023-08-06 MED ORDER — AMLODIPINE BESYLATE 10 MG PO TABS
10.0000 mg | ORAL_TABLET | Freq: Every day | ORAL | 3 refills | Status: DC
Start: 2023-08-06 — End: 2024-08-10
  Filled 2023-08-06 – 2023-08-10 (×2): qty 30, 30d supply, fill #0
  Filled 2023-09-04: qty 30, 30d supply, fill #1
  Filled 2023-10-01: qty 30, 30d supply, fill #2
  Filled 2023-11-02 – 2023-11-04 (×2): qty 30, 30d supply, fill #3
  Filled 2023-11-30 – 2023-12-01 (×2): qty 30, 30d supply, fill #4
  Filled 2023-12-15 – 2023-12-24 (×3): qty 30, 30d supply, fill #5
  Filled 2024-01-15 – 2024-01-19 (×2): qty 30, 30d supply, fill #6
  Filled 2024-02-04 – 2024-02-18 (×4): qty 30, 30d supply, fill #7
  Filled 2024-02-25 – 2024-03-14 (×2): qty 30, 30d supply, fill #8
  Filled 2024-06-22: qty 30, 30d supply, fill #0
  Filled 2024-07-19: qty 30, 30d supply, fill #1

## 2023-08-06 NOTE — Assessment & Plan Note (Signed)
Given high suspicion for gastroparesis, will initiate metoclopramide 5 mg before every meal.  Will monitor for extraparametal effects.  Note that she trialed 1 month of Ozempic and had no improvement, nor did she experience worsening symptoms upon resuming it.  GLP-1 does not appear to be contributing to the problem.

## 2023-08-06 NOTE — Assessment & Plan Note (Signed)
No change in heavily edematous leg. Intolerant of diuretics due to urinary incontinence and associated moisture induced perineal skin injury.  Elevation and compression are the appropriate interventions.

## 2023-08-06 NOTE — Assessment & Plan Note (Addendum)
Hasn't taken morning meds, SCAT came early.  Today's 172/77 is untreated.  Will increase her spironolactone from 25 to 50 mg every day.

## 2023-08-06 NOTE — Assessment & Plan Note (Signed)
Colonoscopy preparation underway for surveillance.

## 2023-08-06 NOTE — Assessment & Plan Note (Signed)
Experiencing symptomatic lows into the 50s which occur overnight or in early morning in which awaken her.  Will decrease her degludec from 62 units to 58 units daily, and will reduce her metformin from 1000 mg daily to 500 mg daily.  Having difficulty with her CGM.

## 2023-08-06 NOTE — Assessment & Plan Note (Signed)
05/2023: Creatinine 0.91, EGFR 70. Microalbumin creatinine ratio 4 months ago had improved considerably from 241 last year to 41.  MOnitor.

## 2023-08-06 NOTE — Patient Instructions (Addendum)
Med changes today;  For high blood pressure:  increase your spironolactone to 2 pills each day (together) insteady of 1 pill a day.  For low blood sugar:  Decrease your insulin to 58 units daily.  Reduce your Glucophage/metformin from 2 pills a day to 1 pill a day.  For sleep: Refilling the trazodone but you should only take 1 pill at night rather than 2.  It is dangerous to take a higher dose in combination with duloxetine.  For slow stomach:  Begin taking metoclopramide 5 mg, 1 tab before each meal.    For diarrhea:  Try some Imodium, but be careful not to go overboard!  See you in 3 months!    Dr. Mayford Knife

## 2023-08-06 NOTE — Progress Notes (Signed)
Routine close f/u for [name] with a number of chronic conditions and more recent problems (months-long) with nausea and sometimes vomiting, and with loose and incontinent stools (constipation is more her norm).    In the interim she underwent unsuccessful colonoscopy due to poor prep, and the study will be repeated at the end of this month.  She missed a couple of appointments with physical therapy for her foot due to inadequate finances to cover transportation, and was advised to have her doctor order new PT course to continue.  She will see her podiatrist on August 20 and will receive her diabetic shoe with toe block.  BP (!) 172/77 (BP Location: Right Arm, Patient Position: Sitting, Cuff Size: Small) Comment: did not take medicine this morming  Pulse 73   Temp 98.1 F (36.7 C) (Oral)   Ht 5\' 5"  (1.651 m)   Wt 210 lb 4.8 oz (95.4 kg)   SpO2 100%   BMI 35.00 kg/m  Bright affect, alert and comfortable.  Skin turgor normal with exception of heavy tight edema of R leg.  No new hand burns due to insensate neuropathy.    Assessment and plan:  Main priorities for patient are her GI symptoms which are addressed below.  Empiric trials of symptomatic meds as we continue with GI evaluation. DM control is very tight and is associated with hypoglycemia.  See below.  Hypertension associated with diabetes (HCC) Hasn't taken morning meds, SCAT came early.  Today's 172/77 is untreated.  Will increase her spironolactone from 25 to 50 mg every day.    Edema of right lower extremity due to peripheral venous insufficiency No change in heavily edematous leg. Intolerant of diuretics due to urinary incontinence and associated moisture induced perineal skin injury.  Elevation and compression are the appropriate interventions.  Nausea and vomiting; early satiety Given high suspicion for gastroparesis (sometimes morning vomitus is undigested food from night prior, despite underwhelming gastric emptying test  results) , will initiate metoclopramide 5 mg before every meal.  Will monitor for extrapyramidal effects.  Note that she trialed 1 month off Ozempic and had no improvement, nor did she experience worsening symptoms upon resuming it.  GLP-1 does not appear to be contributing to the problem.  Chronic diarrhea with nocturnal fecal incontinence Given unsuccessful colonoscopy prep, she will repeat the study at the end of the month.  Assuming no mucosal pathology, we will trial Imodium, with caution to avoid overtreatment resulting in constipation.  Hyperplastic colon polyp Colonoscopy preparation underway for surveillance.  Diabetes mellitus type 2, controlled, with complications (HCC) Experiencing symptomatic lows into the 50s which occur overnight or in early morning in which awaken her.  Will decrease her degludec from 62 units to 58 units daily, and will reduce her metformin from 1000 mg daily to 500 mg daily.  Having difficulty with her CGM.  CKD stage 2 due to type 2 diabetes mellitus (HCC) 05/2023: Creatinine 0.91, EGFR 70. Microalbumin creatinine ratio 4 months ago had improved considerably from 241 last year to 86.  MOnitor.

## 2023-08-06 NOTE — Assessment & Plan Note (Signed)
Given unsuccessful colonoscopy prep, she will repeat the study at the end of the month.  Assuming no mucosal pathology, we will trial Imodium, with caution to avoid overtreatment resulting in constipation.

## 2023-08-07 ENCOUNTER — Other Ambulatory Visit: Payer: Self-pay

## 2023-08-07 ENCOUNTER — Telehealth: Payer: Self-pay | Admitting: *Deleted

## 2023-08-07 ENCOUNTER — Other Ambulatory Visit (HOSPITAL_COMMUNITY): Payer: Self-pay

## 2023-08-07 DIAGNOSIS — G8929 Other chronic pain: Secondary | ICD-10-CM

## 2023-08-07 MED ORDER — OXYCODONE HCL 5 MG PO TABS
5.0000 mg | ORAL_TABLET | ORAL | 0 refills | Status: DC | PRN
Start: 2023-08-07 — End: 2023-08-11
  Filled 2023-08-07: qty 120, 20d supply, fill #0

## 2023-08-07 NOTE — Telephone Encounter (Signed)
Call from patient requesting that prescription for O[facility]cod[facility] [address].

## 2023-08-10 ENCOUNTER — Other Ambulatory Visit: Payer: Self-pay

## 2023-08-10 ENCOUNTER — Telehealth: Payer: Self-pay | Admitting: Internal Medicine

## 2023-08-10 DIAGNOSIS — G8929 Other chronic pain: Secondary | ICD-10-CM

## 2023-08-10 NOTE — Telephone Encounter (Signed)
Pt state she called on 08/07/2023 and he medication is still not ready to pick up.  Pt medication was sent to the- [facility]  but should be sent to the following pharmacy instead.   oxyCODONE (OXY IR/ROXICODONE) 5 MG immediate release tablet   [id number] [place], [place] - [address] (Ph: [phone])

## 2023-08-11 ENCOUNTER — Ambulatory Visit: Payer: 59 | Admitting: Podiatry

## 2023-08-11 MED ORDER — OXYCODONE HCL 5 MG PO TABS
5.0000 mg | ORAL_TABLET | ORAL | 0 refills | Status: DC | PRN
Start: 2023-08-11 — End: 2023-09-07

## 2023-08-26 ENCOUNTER — Encounter: Payer: Self-pay | Admitting: Internal Medicine

## 2023-08-26 ENCOUNTER — Other Ambulatory Visit: Payer: Self-pay

## 2023-08-26 ENCOUNTER — Ambulatory Visit (AMBULATORY_SURGERY_CENTER): Payer: 59

## 2023-08-26 ENCOUNTER — Other Ambulatory Visit (HOSPITAL_COMMUNITY): Payer: Self-pay

## 2023-08-26 VITALS — Ht 65.0 in | Wt 210.0 lb

## 2023-08-26 DIAGNOSIS — Z1211 Encounter for screening for malignant neoplasm of colon: Secondary | ICD-10-CM

## 2023-08-26 MED ORDER — NA SULFATE-K SULFATE-MG SULF 17.5-3.13-1.6 GM/177ML PO SOLN
1.0000 | Freq: Once | ORAL | 0 refills | Status: AC
Start: 2023-08-26 — End: 2023-08-28
  Filled 2023-08-26: qty 354, 1d supply, fill #0

## 2023-08-26 NOTE — Progress Notes (Signed)
No egg or soy allergy known to patient  No issues known to pt with past sedation with any surgeries or procedures Patient denies ever being told they had issues or difficulty with intubation  No FH of Malignant Hyperthermia Pt is not on diet pills Pt is not on  home 02  Pt is not on blood thinners  Pt denies issues with constipation  No A fib or A flutter Have any cardiac testing pending--no Pt can ambulate with walker Pt denies use of chewing tobacco Discussed diabetic I weight loss medication holds Discussed NSAID holds Checked BMI Pt instructed to use Single[employer] or GoodRx for a price reduction on prep  Patient's chart reviewed by Cathlyn Parsons CNRA prior to previsit and patient appropriate for the [facility].  Pre visit completed and red dot placed by patient's name on their procedure day (on provider's schedule).

## 2023-08-29 ENCOUNTER — Other Ambulatory Visit: Payer: Self-pay

## 2023-08-31 ENCOUNTER — Ambulatory Visit: Payer: 59 | Admitting: Podiatry

## 2023-09-02 DIAGNOSIS — E1159 Type 2 diabetes mellitus with other circulatory complications: Secondary | ICD-10-CM | POA: Diagnosis not present

## 2023-09-03 NOTE — Addendum Note (Signed)
Addended by: Charissa Bash A on: 09/03/2023 04:00 PM   Modules accepted: Orders

## 2023-09-04 ENCOUNTER — Other Ambulatory Visit: Payer: Self-pay

## 2023-09-04 ENCOUNTER — Other Ambulatory Visit (HOSPITAL_COMMUNITY): Payer: Self-pay

## 2023-09-07 ENCOUNTER — Other Ambulatory Visit: Payer: Self-pay

## 2023-09-07 ENCOUNTER — Other Ambulatory Visit (HOSPITAL_COMMUNITY): Payer: Self-pay

## 2023-09-07 DIAGNOSIS — G8929 Other chronic pain: Secondary | ICD-10-CM

## 2023-09-08 ENCOUNTER — Other Ambulatory Visit (HOSPITAL_COMMUNITY): Payer: Self-pay

## 2023-09-08 MED ORDER — OXYCODONE HCL 5 MG PO TABS
5.0000 mg | ORAL_TABLET | ORAL | 0 refills | Status: DC | PRN
Start: 2023-09-08 — End: 2023-10-08
  Filled 2023-09-08: qty 120, 20d supply, fill #0

## 2023-09-09 ENCOUNTER — Telehealth: Payer: Self-pay | Admitting: Internal Medicine

## 2023-09-09 ENCOUNTER — Other Ambulatory Visit (HOSPITAL_COMMUNITY): Payer: Self-pay

## 2023-09-09 ENCOUNTER — Encounter: Payer: 59 | Admitting: Internal Medicine

## 2023-09-09 NOTE — Telephone Encounter (Signed)
Good Afternoon,   I called this patient at 1-28 pm this afternoon,  I did not get anyone to answer.  I left a message that if she was running late or needed to reschedule to call us.  I will NO SHOW this patient.  [employer]

## 2023-09-10 ENCOUNTER — Other Ambulatory Visit (HOSPITAL_COMMUNITY): Payer: Self-pay

## 2023-09-10 ENCOUNTER — Other Ambulatory Visit: Payer: Self-pay

## 2023-09-17 ENCOUNTER — Other Ambulatory Visit: Payer: Self-pay

## 2023-09-21 ENCOUNTER — Telehealth: Payer: Self-pay | Admitting: *Deleted

## 2023-09-21 NOTE — Telephone Encounter (Signed)
Call from [facility] [address] customer services with [employer]- stated he needs recent right heel wound measurement for pt's wound supplies. I told him no measures noted on LOV and to call the pt. He stated maybe the wound has healed and he wait until pt calls him.

## 2023-09-22 ENCOUNTER — Other Ambulatory Visit: Payer: Self-pay

## 2023-09-24 ENCOUNTER — Encounter: Payer: Self-pay | Admitting: Podiatry

## 2023-09-24 ENCOUNTER — Ambulatory Visit (INDEPENDENT_AMBULATORY_CARE_PROVIDER_SITE_OTHER): Payer: 59 | Admitting: Podiatry

## 2023-09-24 VITALS — Ht 65.0 in | Wt 210.0 lb

## 2023-09-24 DIAGNOSIS — E1142 Type 2 diabetes mellitus with diabetic polyneuropathy: Secondary | ICD-10-CM | POA: Diagnosis not present

## 2023-09-24 DIAGNOSIS — I739 Peripheral vascular disease, unspecified: Secondary | ICD-10-CM | POA: Diagnosis not present

## 2023-09-24 DIAGNOSIS — Z89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): Secondary | ICD-10-CM | POA: Diagnosis not present

## 2023-09-24 DIAGNOSIS — Z9889 Other specified postprocedural states: Secondary | ICD-10-CM

## 2023-09-24 DIAGNOSIS — G546 Phantom limb syndrome with pain: Secondary | ICD-10-CM | POA: Diagnosis not present

## 2023-09-24 DIAGNOSIS — Z89431 Acquired absence of right foot: Secondary | ICD-10-CM

## 2023-09-27 NOTE — Progress Notes (Signed)
Subjective: Chief Complaint  Patient presents with   Routine Post Op    Post op F/U    Jennifer [name] is a 62 y.o. is seen today in office s/p right transmetatarsal amputation preformed on 02/12/2023.  She states she has been doing well and she denies any open lesions.  She is still awaiting her insert, diabetic shoes.  No open lesions or skin breakdown identified at this time that she reports.  No fevers or chills.  She states the phantom pains are quite bad. Objective: General: No acute distress, AAOx3 -son is present. DP/PT pulses palpable , CRT < 3 sec to all digits.  Right foot: Incision is well coapted without any evidence of dehiscence.  Amputation site is well-healed.  There is some trace edema still present but there is no erythema or warmth.  No skin breakdown.  No ulcerations or preulcerative calluses noted. No pain with calf compression, swelling, warmth, erythema.   Assessment and Plan:  Status post right foot transmetatarsal amputation, localized erythema  -Treatment options discussed including all alternatives, risks, and complications -She is doing well.  Incisions well-healed no signs of infection.  Still awaiting insert with toe filler, diabetic shoes.  Unfortunately not able to place this since she was remeasured today by Jennifer [name], pedorthist. -Discussed daily foot inspection, glucose control   Return in about 3 months (around 12/25/2023) for foot exam.  Jennifer [name] DPM  -Send appears to have healed well.  Discussed weightbearing in boot and continued physical therapy.  I do want to measure her for toe filler, inserts but the swelling to improve first before proceeding with this.  I will schedule her for follow-up for measurement of inserts and I will see her back in a couple months as well.  Discussed daily foot inspection.  If any changes to let me know immediately. -Continue pregabalin she is also on Cymbalta.  If her pain continues may need to refer to pain  management given phantom pains.  Jennifer [name] DPM

## 2023-09-28 ENCOUNTER — Other Ambulatory Visit: Payer: Self-pay | Admitting: Internal Medicine

## 2023-09-28 DIAGNOSIS — G8929 Other chronic pain: Secondary | ICD-10-CM

## 2023-09-28 NOTE — Telephone Encounter (Signed)
Pt states she was seen on 08/06/2023 and she has not rec'd a Cream for a Rash on her leg.  The pt can not remember the name of it.  She is also needing the following medication refilled:    oxyCODONE (OXY IR/ROXICODONE) 5 MG immediate release tablet    [facility] [place]

## 2023-09-29 ENCOUNTER — Other Ambulatory Visit: Payer: Self-pay

## 2023-10-01 ENCOUNTER — Other Ambulatory Visit: Payer: Self-pay

## 2023-10-01 ENCOUNTER — Other Ambulatory Visit: Payer: Self-pay | Admitting: Internal Medicine

## 2023-10-01 DIAGNOSIS — Z794 Long term (current) use of insulin: Secondary | ICD-10-CM

## 2023-10-01 NOTE — Telephone Encounter (Signed)
Pt states she need cream for a rash on her leg. States she came in on 08/06/2023 and discuss this with her doctor. Wanting to know if Dr. Mayford Knife can send in Rx to her pharmacy.

## 2023-10-02 ENCOUNTER — Other Ambulatory Visit: Payer: Self-pay | Admitting: Internal Medicine

## 2023-10-02 ENCOUNTER — Other Ambulatory Visit (HOSPITAL_COMMUNITY): Payer: Self-pay

## 2023-10-02 ENCOUNTER — Other Ambulatory Visit: Payer: Self-pay

## 2023-10-02 DIAGNOSIS — E119 Type 2 diabetes mellitus without complications: Secondary | ICD-10-CM

## 2023-10-02 DIAGNOSIS — E1159 Type 2 diabetes mellitus with other circulatory complications: Secondary | ICD-10-CM | POA: Diagnosis not present

## 2023-10-05 ENCOUNTER — Other Ambulatory Visit: Payer: Self-pay

## 2023-10-05 ENCOUNTER — Other Ambulatory Visit (HOSPITAL_COMMUNITY): Payer: Self-pay

## 2023-10-05 MED ORDER — METOCLOPRAMIDE HCL 5 MG PO TABS
5.0000 mg | ORAL_TABLET | Freq: Three times a day (TID) | ORAL | 1 refills | Status: DC
  Filled 2023-10-05: qty 90, 30d supply, fill #0
  Filled 2023-11-02 – 2023-11-04 (×2): qty 90, 30d supply, fill #1

## 2023-10-05 NOTE — Telephone Encounter (Signed)
Last rx written - 09/08/23. Last OV - 8/15. Next OV - has not been scheduled. TOX - 06/16/19 Pt is also requesting a cream for a rash.

## 2023-10-05 NOTE — Telephone Encounter (Signed)
Third attempt  pt is calling again about her rash

## 2023-10-06 ENCOUNTER — Other Ambulatory Visit: Payer: Self-pay

## 2023-10-06 ENCOUNTER — Telehealth: Payer: Self-pay | Admitting: *Deleted

## 2023-10-06 ENCOUNTER — Other Ambulatory Visit (HOSPITAL_COMMUNITY): Payer: Self-pay

## 2023-10-06 ENCOUNTER — Other Ambulatory Visit: Payer: Self-pay | Admitting: Internal Medicine

## 2023-10-06 MED ORDER — DULOXETINE HCL 30 MG PO CPEP
30.0000 mg | ORAL_CAPSULE | Freq: Two times a day (BID) | ORAL | 1 refills | Status: DC
  Filled 2023-10-06: qty 60, 30d supply, fill #0
  Filled 2023-11-02 – 2023-11-04 (×2): qty 60, 30d supply, fill #1
  Filled 2023-11-30 – 2023-12-01 (×2): qty 60, 30d supply, fill #2
  Filled 2023-12-15 – 2023-12-24 (×3): qty 60, 30d supply, fill #3
  Filled 2024-01-15 – 2024-01-19 (×2): qty 60, 30d supply, fill #4
  Filled 2024-02-04 – 2024-02-16 (×3): qty 60, 30d supply, fill #5

## 2023-10-06 NOTE — Telephone Encounter (Signed)
Pt is calling again for c/o red, painful rash x 1 year. Pt states it's in her groin area and left thigh. She has tried OTC meds which has not helped. Unable to come in tomorrow afternoon for an appt b/c she has a dental appt. Requesting a rx for a cream. Thanks

## 2023-10-07 ENCOUNTER — Other Ambulatory Visit: Payer: Self-pay

## 2023-10-07 NOTE — Telephone Encounter (Signed)
Per chart, pt has an appt tomorrow 10/17 with Dr Rosaura Carpenter.

## 2023-10-07 NOTE — Addendum Note (Signed)
Addended by: Hassan Buckler on: 10/07/2023 01:50 PM   Modules accepted: Orders

## 2023-10-08 ENCOUNTER — Other Ambulatory Visit: Payer: Self-pay

## 2023-10-08 ENCOUNTER — Encounter: Payer: Self-pay | Admitting: Student

## 2023-10-08 ENCOUNTER — Ambulatory Visit: Payer: 59 | Admitting: Student

## 2023-10-08 VITALS — BP 140/80 | HR 71 | Wt 214.5 lb

## 2023-10-08 DIAGNOSIS — B372 Candidiasis of skin and nail: Secondary | ICD-10-CM

## 2023-10-08 DIAGNOSIS — G547 Phantom limb syndrome without pain: Secondary | ICD-10-CM | POA: Diagnosis not present

## 2023-10-08 DIAGNOSIS — K529 Noninfective gastroenteritis and colitis, unspecified: Secondary | ICD-10-CM | POA: Diagnosis not present

## 2023-10-08 DIAGNOSIS — G8929 Other chronic pain: Secondary | ICD-10-CM

## 2023-10-08 MED ORDER — OXYCODONE HCL 5 MG PO TABS: 5.0000 mg | ORAL_TABLET | ORAL | 0 refills | Status: DC | PRN

## 2023-10-08 MED ORDER — NYSTATIN 100000 UNIT/GM EX CREA
1.0000 | TOPICAL_CREAM | Freq: Two times a day (BID) | CUTANEOUS | 0 refills | Status: DC
Start: 2023-10-08 — End: 2023-10-19

## 2023-10-08 MED ORDER — NYSTATIN 100000 UNIT/GM EX POWD
1.0000 | Freq: Three times a day (TID) | CUTANEOUS | 0 refills | Status: DC
Start: 2023-10-08 — End: 2023-10-19

## 2023-10-08 NOTE — Progress Notes (Signed)
Subjective:  CC: Rash  HPI:  [name] is a 62 y.o. person with a past medical history stated below and presents today for rash. Please see problem based assessment and plan for additional details.  Past Medical History:  Diagnosis Date   Acute vestibular syndrome, resolved 03/02/2021   Angioedema 06/14/2021   Asthma    Burn of finger of right hand, second degree 02/05/2021   Occurred during cooking (frying), poor sensation due to neuropathy, pt punctured blister to allow it to drain, skin has since desquamated over dorsal joint, no infection.  Keep clean and dry, OTC antibacterial ointment.   Cataract    CHF (congestive heart failure) (HCC)    Chronic bronchitis (HCC)    "I get it alot" (09/28/2013)   Chronic diastolic heart failure (HCC)    grade 2 per 2D echocardiogram (01/2013)   Chronic kidney disease    Chronic lower back pain    Chronic pain syndrome 12/03/2011   Likely secondary to depression, "fibromyalgia", neuropathy, and obesity. Lumbar MRI 2014 no sig change from prior (2008) : Stable hypertrophic facet disease most notable at L4-5. Stable shallow left foraminal/extraforaminal disc protrusion at L4-5. No direct neural compression.       Chronic right shoulder pain 10/10/2021   COPD 01/08/2007   PFT's 05/2007 : FEV1/FVC 82, FEV1 64% pred, FEF 25-75% 40% predicted, 16% improvement in FEV1 with bronchodilators.      Depression    Diabetic peripheral neuropathy (HCC)    Dizziness, resolved (admitted with vestibular migraine)    DVT of upper extremity (deep vein thrombosis) (HCC) 03/11/2013   Secondary to PICC line. Right brachial vein, diagnosed on 03/10/2013 Coumadin for 3 months. 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  S. Aureus and GBS.    History of use of hearing aid    Hyperlipidemia    Hyperplastic colon polyp 12/2010   Per colonoscopy (12/2010) - Dr. 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       Initially evaluated 08/2019 with dedicated MRI: "IMPRESSION:  Skin marker overlies a 9 x 10 x 11 mm cyst within the anterior  aspect of the superficial lobe of the right parotid gland. This is  presumed to be a benign cyst. The possibility of a cystic Warthin's  tumor   Pneumonia    PVD (peripheral vascular disease) with claudication (HCC)    Stents to bilateral common iliac arteries (left 2005, right 2008), on chronic plavix   Pyelonephritis 10/28/2020   S/P BKA (below knee amputation) unilateral (HCC)    2014 L - failed limb preserving treatment. 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  Evaluated on 07/08/2021 by podiatrist Dr. Logan Bores who debrided necrotic tissue and prescribed doxycycline.  He will see Jennifer [name] again in 3 weeks.  The location of this ulcer on the dorsal aspect of the toe is somewhat atypical for a purely diabetic foot wound, and Jennifer [name] does have a strong DP pulse.  I did not examine h   Type II diabetes mellitus with peripheral circulatory disorders, uncontrolled DX:  1993   Insulin dep. Poor control. Complicated by diabetic foot ulcer and diabetic eye disease.     Upper respiratory tract infection due to COVID-19 virus 07/22/2022   Urinary incontinence, severe, mixed (stress, urge, functional) 02/14/2023   Unresponsive to medications    Current Outpatient Medications on File Prior to Visit  Medication Sig Dispense Refill   acetaminophen (TYLENOL) 650 MG CR tablet Take 2 tablets (1,300 mg total) by mouth every 8 (eight) hours as needed for pain. 30 tablet 0   albuterol (PROAIR HFA) 108 (90 Base) MCG/ACT inhaler INHALE 2 PUFFS BY MOUTH EVERY 6 HOURS AS NEEDED FOR WHEEZING 54 g 3   amLODipine (NORVASC) 10 MG tablet Take 1 tablet (10 mg total) by mouth daily. 90 tablet 3   aspirin EC 81 MG tablet Take 1 tablet (81 mg total) by mouth daily. 30 tablet    Baclofen 5 MG TABS Take 1 tablet (5 mg total) by mouth 2 (two) times daily. 180 tablet 3   Continuous Glucose Receiver (FREESTYLE LIBRE 3 READER) DEVI Use to continuously monitor blood glucose 1 each 1   Continuous Glucose Sensor (FREESTYLE LIBRE 3 SENSOR) MISC Place 1 sensor on the skin every 14 days. Use to check glucose continuously 6 each 3   DULoxetine (CYMBALTA) 30 MG capsule Take 1 capsule (30 mg total) by mouth 2 (two) times daily. 180 capsule 1   glucose blood test strip Use 3 (three) times daily. 500 strip 0   insulin degludec (TRESIBA FLEXTOUCH) 200 UNIT/ML FlexTouch Pen Inject 58 Units into the skin daily. 9 mL 3   Insulin Pen Needle 32G X 4 MM MISC Use to inject insulin 4 (four) times daily. 100 each 10   Lancets MISC Use as directed 100 each 5   loperamide (IMODIUM A-D) 2 MG capsule Take 1 capsule (2 mg total) by mouth as needed for diarrhea or loose stools.     metFORMIN (GLUCOPHAGE-XR) 500 MG 24 hr tablet Take 1 tablet (500 mg total) by mouth daily with breakfast. 180 tablet 3   metoCLOPramide (REGLAN) 5 MG tablet Take 1 tablet (5 mg total) by mouth 3 (three) times daily before meals. 90 tablet 1    metoprolol succinate (TOPROL-XL) 100 MG 24 hr tablet Take 1 tablet (100 mg total) by mouth daily. TAKE WITH OR IMMEDIATELY FOLLOWING A MEAL. 90 tablet 3   Multiple Vitamin (MULTIVITAMIN) tablet Take 1 tablet by mouth daily.     MYRBETRIQ 50 MG TB24 tablet Take 1 tablet (50 mg total) by mouth daily. 90 tablet 3   ondansetron (ZOFRAN-ODT) 4 MG disintegrating tablet Take 1 tablet (4 mg total) by mouth every 8 (eight) hours as needed for nausea or vomiting. 20 tablet 0   OVER THE COUNTER MEDICATION Take 1 tablet by mouth daily. [employer] otc supplement  oxybutynin (DITROPAN-XL) 10 MG 24 hr tablet Take 1 tablet (10 mg total) by mouth daily. 90 tablet 1   oxyCODONE (OXY IR/ROXICODONE) 5 MG immediate release tablet Take 1 tablet (5 mg total) by mouth every 4 (four) hours as needed for severe pain. 120 tablet 0   pantoprazole (PROTONIX) 40 MG tablet Take 1 tablet (40 mg total) by mouth daily. 90 tablet 3   pregabalin (LYRICA) 200 MG capsule Take 1 capsule (200 mg total) by mouth 3 (three) times daily. 270 capsule 1   rosuvastatin (CRESTOR) 20 MG tablet Take 1 tablet (20 mg total) by mouth in the morning. 90 tablet 3   Semaglutide, 1 MG/DOSE, (OZEMPIC, 1 MG/DOSE,) 4 MG/3ML SOPN Inject 1 mg into the skin once a week. 3 mL 12   spironolactone (ALDACTONE) 25 MG tablet Take 2 tablets (50 mg total) by mouth daily. 30 tablet 5   traZODone (DESYREL) 100 MG tablet Take 1 tablet (100 mg total) by mouth at bedtime as needed for sleep. 60 tablet 11   umeclidinium-vilanterol (ANORO ELLIPTA) 62.5-25 MCG/ACT AEPB Inhale 1 puff into the lungs in the morning. 120 each 11   [DISCONTINUED] Potassium Chloride ER 20 MEQ TBCR Take 40 mEq by mouth daily. 30 tablet 0   No current facility-administered medications on file prior to visit.    Review of Systems: Please see assessment and plan for pertinent positives and negatives.  Objective:   Vitals:   10/08/23 0841 10/08/23 0935  BP: (!) 140/69 (!) 140/80  Pulse:  73 71  SpO2: 99%   Weight: 214 lb 8 oz (97.3 kg)     Physical Exam: Constitutional: No acute distress Cardiovascular: regular rate and rhythm, no m/r/g Pulmonary/Chest: normal work of breathing on room air, lungs clear to auscultation bilaterally Abdominal: soft, non-tender, non-distended Extremities: Left above-the-knee amputation, right transmetatarsal amputation.  Right lower extremity edema, in setting of known venous insufficiency.  Skin: Mild erythema, scaling, with some excoriations favoring the areas of skin folds. No perianal involvement. Some macerate lesions of the inner thigh.  psych: Pleasant affect   Assessment & Plan:  Candidal intertrigo Patient endorses ongoing rash and pain of the groin.  This has been going on for greater than 1 year.  Jennifer [name] is aware that much of this rash is likely due to Jennifer [name] urinary incontinence, and constant moisture of that area.  Jennifer [name] denies fevers chills or night sweats.  Jennifer [name] denies purulent drainage.  Jennifer [name] does use Desitin powder in the area and wears pull ups for incontinence. Jennifer [name] denies pruritus of the area. Jennifer [name] does wash the area well in the shower and dry the area thoroughly patting dry with a towel.On examination today, Jennifer [name] does appear to have findings consistent with candidal infection. Plan: Nystatin Cream / Powder prescribed  Patient to see which one works best for Jennifer [name].  Patient to follow with Dr. 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  Jennifer [name] is on oxycodone for pain, but we discussed that this may not be an optimal pain management solution for Jennifer [name].   Plan: Out of Oxycodone, pain management per Dr. Mayford Knife Will discuss alterate options at next visit with Dr. Mayford Knife.    Chronic diarrhea with nocturnal fecal incontinence Bowel prep was inadequate at last colonoscopy, it is recommended that Jennifer [name]  complete a 2-day bowel prep and returns  for another colonoscopy.  The patient states that Jennifer [name] fecal incontinence has gotten a bit better since last visit.    Patient seen with Dr. Willow Ora MD [facility]  PGY-1 Pager: [phone]  Phone: [phone] Date 10/08/2023  Time 9:46 AM

## 2023-10-08 NOTE — Assessment & Plan Note (Signed)
Patient endorses ongoing rash and pain of the groin.  This has been going on for greater than 1 year.  She is aware that much of this rash is likely due to her urinary incontinence, and constant moisture of that area.  She denies fevers chills or night sweats.  She denies purulent drainage.  She does use Desitin powder in the area and wears pull ups for incontinence. She denies pruritus of the area. She does wash the area well in the shower and dry the area thoroughly patting dry with a towel.On examination today, she does appear to have findings consistent with candidal infection. Plan: Nystatin Cream / Powder prescribed  Patient to see which one works best for her.  Patient to follow with Dr. Mayford Knife in 3 weeks for reevaluation

## 2023-10-08 NOTE — Assessment & Plan Note (Signed)
Patient endorses ongoing phantom limb pain, she says the pain is so bad that she wants to jump from the window at times.  She is on oxycodone for pain, but we discussed that this may not be an optimal pain management solution for her.   Plan: Out of Oxycodone, pain management per Dr. Mayford Knife Will discuss alterate options at next visit with Dr. Mayford Knife.

## 2023-10-08 NOTE — Patient Instructions (Addendum)
Thank you, Ms.Adiana L Armel for allowing [place] to provide your care today.  I have ordered the following medication/changed the following medications:    Start the following medications: Meds ordered this encounter  Medications   nystatin powder    Sig: Apply 1 Application topically 3 (three) times daily.    Dispense:  15 g    Refill:  0   nystatin cream (MYCOSTATIN)    Sig: Apply 1 Application topically 2 (two) times daily.    Dispense:  30 g    Refill:  0     Follow up:  3 weeks  with Dr. Mayford Knife   Call your GI doctor to get your Colonoscopy  Carie Caddy. Rhea Belton, MD [facility] [address] [place] [address] [place], [place] [address] [phone]   We look forward to seeing you next time. Please call our clinic at [phone] if you have any questions or concerns. The best time to call is Monday-Friday from 9am-4pm, but there is someone available 24/7. If after hours or the weekend, call the main hospital number and ask for the Internal Medicine Resident On-Call. If you need medication refills, please notify your pharmacy one week in advance and they will send [place] a request.   Thank you for trusting me with your care. Wishing you the best!  Lovie Macadamia MD [facility]

## 2023-10-08 NOTE — Assessment & Plan Note (Addendum)
Bowel prep was inadequate at last colonoscopy, it is recommended that she complete a 2-day bowel prep and returns for another colonoscopy.  The patient states that her fecal incontinence has gotten a bit better since last visit.

## 2023-10-09 NOTE — Progress Notes (Signed)
Internal Medicine Clinic Attending  I was physically present during the key portions of the resident provided service and participated in the medical decision making of patient's management care. I reviewed pertinent patient test results.  The assessment, diagnosis, and plan were formulated together and I agree with the documentation in the resident's note.  Williams, Julie Anne, MD  

## 2023-10-19 ENCOUNTER — Other Ambulatory Visit: Payer: Self-pay

## 2023-10-19 DIAGNOSIS — B372 Candidiasis of skin and nail: Secondary | ICD-10-CM

## 2023-10-20 ENCOUNTER — Other Ambulatory Visit: Payer: Self-pay

## 2023-10-20 MED ORDER — NYSTATIN 100000 UNIT/GM EX CREA
1.0000 | TOPICAL_CREAM | Freq: Two times a day (BID) | CUTANEOUS | 0 refills | Status: DC
  Filled 2023-10-20: qty 30, 15d supply, fill #0

## 2023-10-20 MED ORDER — NYSTATIN 100000 UNIT/GM EX POWD
1.0000 | Freq: Three times a day (TID) | CUTANEOUS | 0 refills | Status: DC
  Filled 2023-10-20: qty 15, 5d supply, fill #0

## 2023-10-21 ENCOUNTER — Other Ambulatory Visit: Payer: Self-pay

## 2023-10-27 ENCOUNTER — Other Ambulatory Visit: Payer: Self-pay

## 2023-10-28 NOTE — Progress Notes (Signed)
DM shoes order placed today ppw will be sent to treating Dr when returned Shoes and inserts will be shipped  Jennifer [name]

## 2023-10-29 ENCOUNTER — Encounter: Payer: 59 | Admitting: Internal Medicine

## 2023-10-31 ENCOUNTER — Emergency Department (HOSPITAL_COMMUNITY): Payer: 59

## 2023-10-31 ENCOUNTER — Other Ambulatory Visit: Payer: Self-pay

## 2023-10-31 ENCOUNTER — Encounter (HOSPITAL_COMMUNITY): Payer: Self-pay

## 2023-10-31 ENCOUNTER — Emergency Department (HOSPITAL_COMMUNITY)
Admission: EM | Admit: 2023-10-31 | Discharge: 2023-10-31 | Disposition: A | Discharge status: Home / Self Care | Payer: 59 | Attending: Emergency Medicine | Admitting: Emergency Medicine

## 2023-10-31 DIAGNOSIS — K118 Other diseases of salivary glands: Secondary | ICD-10-CM | POA: Diagnosis not present

## 2023-10-31 DIAGNOSIS — R112 Nausea with vomiting, unspecified: Secondary | ICD-10-CM | POA: Diagnosis not present

## 2023-10-31 DIAGNOSIS — J449 Chronic obstructive pulmonary disease, unspecified: Secondary | ICD-10-CM | POA: Insufficient documentation

## 2023-10-31 DIAGNOSIS — I130 Hypertensive heart and chronic kidney disease with heart failure and stage 1 through stage 4 chronic kidney disease, or unspecified chronic kidney disease: Secondary | ICD-10-CM | POA: Diagnosis not present

## 2023-10-31 DIAGNOSIS — Z7982 Long term (current) use of aspirin: Secondary | ICD-10-CM | POA: Insufficient documentation

## 2023-10-31 DIAGNOSIS — E1142 Type 2 diabetes mellitus with diabetic polyneuropathy: Secondary | ICD-10-CM | POA: Insufficient documentation

## 2023-10-31 DIAGNOSIS — I6782 Cerebral ischemia: Secondary | ICD-10-CM | POA: Diagnosis not present

## 2023-10-31 DIAGNOSIS — R531 Weakness: Secondary | ICD-10-CM | POA: Diagnosis not present

## 2023-10-31 DIAGNOSIS — N281 Cyst of kidney, acquired: Secondary | ICD-10-CM | POA: Diagnosis not present

## 2023-10-31 DIAGNOSIS — R29810 Facial weakness: Secondary | ICD-10-CM | POA: Insufficient documentation

## 2023-10-31 DIAGNOSIS — Z20822 Contact with and (suspected) exposure to covid-19: Secondary | ICD-10-CM | POA: Diagnosis not present

## 2023-10-31 DIAGNOSIS — I639 Cerebral infarction, unspecified: Secondary | ICD-10-CM | POA: Diagnosis not present

## 2023-10-31 DIAGNOSIS — N182 Chronic kidney disease, stage 2 (mild): Secondary | ICD-10-CM | POA: Insufficient documentation

## 2023-10-31 DIAGNOSIS — F17210 Nicotine dependence, cigarettes, uncomplicated: Secondary | ICD-10-CM | POA: Insufficient documentation

## 2023-10-31 DIAGNOSIS — J45909 Unspecified asthma, uncomplicated: Secondary | ICD-10-CM | POA: Diagnosis not present

## 2023-10-31 DIAGNOSIS — Z79899 Other long term (current) drug therapy: Secondary | ICD-10-CM | POA: Diagnosis not present

## 2023-10-31 DIAGNOSIS — G319 Degenerative disease of nervous system, unspecified: Secondary | ICD-10-CM | POA: Diagnosis not present

## 2023-10-31 DIAGNOSIS — E1122 Type 2 diabetes mellitus with diabetic chronic kidney disease: Secondary | ICD-10-CM | POA: Insufficient documentation

## 2023-10-31 DIAGNOSIS — Z7984 Long term (current) use of oral hypoglycemic drugs: Secondary | ICD-10-CM | POA: Insufficient documentation

## 2023-10-31 DIAGNOSIS — I5032 Chronic diastolic (congestive) heart failure: Secondary | ICD-10-CM | POA: Insufficient documentation

## 2023-10-31 DIAGNOSIS — I6523 Occlusion and stenosis of bilateral carotid arteries: Secondary | ICD-10-CM | POA: Diagnosis not present

## 2023-10-31 DIAGNOSIS — R197 Diarrhea, unspecified: Secondary | ICD-10-CM | POA: Diagnosis not present

## 2023-10-31 DIAGNOSIS — I10 Essential (primary) hypertension: Secondary | ICD-10-CM | POA: Diagnosis not present

## 2023-10-31 DIAGNOSIS — R109 Unspecified abdominal pain: Secondary | ICD-10-CM | POA: Diagnosis not present

## 2023-10-31 DIAGNOSIS — Z794 Long term (current) use of insulin: Secondary | ICD-10-CM | POA: Diagnosis not present

## 2023-10-31 LAB — COMPREHENSIVE METABOLIC PANEL
ALT: 18 U/L (ref 0–44)
AST: 18 U/L (ref 15–41)
Albumin: 3.3 g/dL — ABNORMAL LOW (ref 3.5–5.0)
Alkaline Phosphatase: 69 U/L (ref 38–126)
Anion gap: 11 (ref 5–15)
BUN: 12 mg/dL (ref 8–23)
CO2: 25 mmol/L (ref 22–32)
Calcium: 9 mg/dL (ref 8.9–10.3)
Chloride: 101 mmol/L (ref 98–111)
Creatinine, Ser: 0.96 mg/dL (ref 0.44–1.00)
GFR, Estimated: >60 mL/min (ref >60)
Glucose, Bld: 236 mg/dL — ABNORMAL HIGH (ref 70–99)
Potassium: 3.2 mmol/L — ABNORMAL LOW (ref 3.5–5.1)
Sodium: 137 mmol/L (ref 135–145)
Total Bilirubin: 0.7 mg/dL (ref <1.2)
Total Protein: 6.8 g/dL (ref 6.5–8.1)

## 2023-10-31 LAB — CBC WITH DIFFERENTIAL/PLATELET
Abs Immature Granulocytes: 0.01 10*3/uL (ref 0.00–0.07)
Basophils Absolute: 0.1 10*3/uL (ref 0.0–0.1)
Basophils Relative: 1 %
Eosinophils Absolute: 0 10*3/uL (ref 0.0–0.5)
Eosinophils Relative: 1 %
HCT: 41.4 % (ref 36.0–46.0)
Hemoglobin: 13.7 g/dL (ref 12.0–15.0)
Immature Granulocytes: 0 %
Lymphocytes Relative: 46 %
Lymphs Abs: 3.2 10*3/uL (ref 0.7–4.0)
MCH: 30.6 pg (ref 26.0–34.0)
MCHC: 33.1 g/dL (ref 30.0–36.0)
MCV: 92.6 fL (ref 80.0–100.0)
Monocytes Absolute: 0.4 10*3/uL (ref 0.1–1.0)
Monocytes Relative: 5 %
Neutro Abs: 3.4 10*3/uL (ref 1.7–7.7)
Neutrophils Relative %: 47 %
Platelets: 171 10*3/uL (ref 150–400)
RBC: 4.47 MIL/uL (ref 3.87–5.11)
RDW: 13.9 % (ref 11.5–15.5)
WBC: 7.1 10*3/uL (ref 4.0–10.5)
nRBC: 0 % (ref 0.0–0.2)

## 2023-10-31 LAB — URINALYSIS, ROUTINE W REFLEX MICROSCOPIC
Bilirubin Urine: NEGATIVE
Glucose, UA: >=500 mg/dL — AB
Ketones, ur: NEGATIVE mg/dL
Nitrite: NEGATIVE
Protein, ur: 100 mg/dL — AB
Specific Gravity, Urine: 1.022 (ref 1.005–1.030)
pH: 5 (ref 5.0–8.0)

## 2023-10-31 LAB — APTT: aPTT: 26 s (ref 24–36)

## 2023-10-31 LAB — I-STAT CHEM 8, ED
BUN: 13 mg/dL (ref 8–23)
Calcium, Ion: 1.11 mmol/L — ABNORMAL LOW (ref 1.15–1.40)
Chloride: 100 mmol/L (ref 98–111)
Creatinine, Ser: 0.9 mg/dL (ref 0.44–1.00)
Glucose, Bld: 235 mg/dL — ABNORMAL HIGH (ref 70–99)
HCT: 43 % (ref 36.0–46.0)
Hemoglobin: 14.6 g/dL (ref 12.0–15.0)
Potassium: 3.5 mmol/L (ref 3.5–5.1)
Sodium: 140 mmol/L (ref 135–145)
TCO2: 24 mmol/L (ref 22–32)

## 2023-10-31 LAB — RESP PANEL BY RT-PCR (RSV, FLU A&B, COVID)  RVPGX2
Influenza A by PCR: NEGATIVE
Influenza B by PCR: NEGATIVE
Resp Syncytial Virus by PCR: NEGATIVE
SARS Coronavirus 2 by RT PCR: NEGATIVE

## 2023-10-31 LAB — LIPASE, BLOOD: Lipase: 26 U/L (ref 11–51)

## 2023-10-31 LAB — ETHANOL: Alcohol, Ethyl (B): <10 mg/dL (ref <10)

## 2023-10-31 LAB — PROTIME-INR
INR: 1.1 (ref 0.8–1.2)
Prothrombin Time: 14.7 s (ref 11.4–15.2)

## 2023-10-31 MED ORDER — ONDANSETRON 4 MG PO TBDP
4.0000 mg | ORAL_TABLET | Freq: Once | ORAL | Status: AC | PRN
Start: 2023-10-31 — End: 2023-10-31
  Administered 2023-10-31: 4 mg via ORAL
  Filled 2023-10-31: qty 1

## 2023-10-31 MED ORDER — SODIUM CHLORIDE 0.9% FLUSH: 3.0000 mL | Freq: Once | INTRAVENOUS | Status: DC

## 2023-10-31 NOTE — Discharge Instructions (Addendum)
The MRI today did not show any signs of a stroke.  Your blood work looked good.

## 2023-10-31 NOTE — ED Provider Notes (Signed)
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 right shoulder pain 10/10/2021   COPD 01/08/2007   PFT's 05/2007 : FEV1/FVC 82, FEV1 64% pred, FEF 25-75% 40% predicted, 16% improvement in FEV1 with bronchodilators.      Depression    Diabetic peripheral neuropathy (HCC)    Dizziness, resolved (admitted with vestibular migraine)    DVT of upper extremity (deep vein thrombosis) (HCC) 03/11/2013   Secondary to PICC line. Right brachial vein, diagnosed on 03/10/2013 Coumadin for 3 months. 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  S. Aureus and GBS.    History of use of hearing aid    Hyperlipidemia    Hyperplastic colon polyp 12/2010   Per colonoscopy (12/2010) - Dr. 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       Initially evaluated 08/2019 with dedicated MRI: "IMPRESSION:  Skin marker overlies a 9 x 10 x 11 mm cyst within the anterior  aspect of the superficial lobe of the right parotid gland. This is  presumed to be a benign cyst. The possibility of a cystic Warthin's  tumor   Pneumonia    PVD (peripheral vascular disease) with claudication (HCC)    Stents to bilateral common iliac arteries (left 2005, right 2008), on chronic plavix   Pyelonephritis 10/28/2020   S/P BKA (below knee amputation) unilateral (HCC)    2014 L - failed limb preserving treatment. 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  Evaluated on 07/08/2021 by podiatrist Dr. Logan Bores who debrided necrotic tissue and prescribed doxycycline.  He will see her again in 3 weeks.  The location of this ulcer on the dorsal aspect of the toe is somewhat atypical for a purely diabetic foot wound, and she does have a strong DP pulse.  I did not examine h   Type II diabetes mellitus with peripheral circulatory disorders, uncontrolled DX: 1993   Insulin dep. Poor control. Complicated by diabetic foot ulcer and diabetic eye disease.     Upper respiratory tract infection due to COVID-19 virus 07/22/2022    Urinary incontinence, severe, mixed (stress, urge, functional) 02/14/2023   Unresponsive to medications      Dispostion: Signed out to Dr. Dalene Seltzer pending MRI, CT results.     Final Clinical Impression(s) / ED Diagnoses Final diagnoses:  Nausea vomiting and diarrhea  Facial droop     @PCDICTATION @    Anders Simmonds T, DO 10/31/23 2032

## 2023-10-31 NOTE — ED Triage Notes (Addendum)
Pt c/o dizziness, weakness, abdominal pain, vomiting, chills, and headache for past week. Pt has left sided facial droop, states it has been that way for a week. Pt denies previous stroke.

## 2023-10-31 NOTE — ED Provider Notes (Signed)
Assumed checkout from Dr. Elisabeth Most patient waiting on an MRI to rule out stroke.  Labs are reassuring today.  MRI read by radiologist reports questionable 4 mm focus of diffusion signaling abnormality in the left thalamus which could be artifact but could also be possible stroke.  Spoke with Dr. Randal Buba with neurology who also evaluated the MRI and felt to be artifact.  He reported this would not cause the facial droop that was seen earlier.  He reports the patient is cleared to go home from a stroke standpoint.  Went and reevaluated the patient.  She reports feeling better.  She is ready to go home.   Jennifer Sprout, MD 10/31/23 [phone]

## 2023-11-02 ENCOUNTER — Other Ambulatory Visit: Payer: Self-pay

## 2023-11-02 ENCOUNTER — Other Ambulatory Visit: Payer: Self-pay | Admitting: *Deleted

## 2023-11-02 ENCOUNTER — Other Ambulatory Visit (HOSPITAL_COMMUNITY): Payer: Self-pay

## 2023-11-02 DIAGNOSIS — E1159 Type 2 diabetes mellitus with other circulatory complications: Secondary | ICD-10-CM

## 2023-11-02 DIAGNOSIS — G8929 Other chronic pain: Secondary | ICD-10-CM

## 2023-11-02 DIAGNOSIS — R112 Nausea with vomiting, unspecified: Secondary | ICD-10-CM

## 2023-11-02 DIAGNOSIS — E1142 Type 2 diabetes mellitus with diabetic polyneuropathy: Secondary | ICD-10-CM

## 2023-11-03 ENCOUNTER — Other Ambulatory Visit: Payer: Self-pay

## 2023-11-03 ENCOUNTER — Other Ambulatory Visit: Payer: Self-pay | Admitting: Dietician

## 2023-11-03 DIAGNOSIS — E118 Type 2 diabetes mellitus with unspecified complications: Secondary | ICD-10-CM

## 2023-11-03 NOTE — Telephone Encounter (Signed)
Jennifer [name] would like help getting meds for her nausea and vomiting,  albuterol, meter and supplies and a new reader. She lost her reader and does not have a meter.

## 2023-11-04 ENCOUNTER — Other Ambulatory Visit (HOSPITAL_COMMUNITY): Payer: Self-pay

## 2023-11-04 ENCOUNTER — Other Ambulatory Visit: Payer: Self-pay | Admitting: Internal Medicine

## 2023-11-04 ENCOUNTER — Other Ambulatory Visit: Payer: Self-pay

## 2023-11-04 DIAGNOSIS — E1159 Type 2 diabetes mellitus with other circulatory complications: Secondary | ICD-10-CM

## 2023-11-04 DIAGNOSIS — I152 Hypertension secondary to endocrine disorders: Secondary | ICD-10-CM

## 2023-11-04 DIAGNOSIS — E118 Type 2 diabetes mellitus with unspecified complications: Secondary | ICD-10-CM

## 2023-11-04 MED ORDER — ACCU-CHEK SOFTCLIX LANCETS MISC: 12 refills | Status: DC

## 2023-11-04 MED ORDER — SPIRONOLACTONE 25 MG PO TABS
50.0000 mg | ORAL_TABLET | Freq: Every day | ORAL | 5 refills | Status: DC
  Filled 2023-11-04: qty 60, 30d supply, fill #0
  Filled 2023-11-30 – 2023-12-01 (×2): qty 60, 30d supply, fill #1
  Filled 2023-12-15 – 2023-12-24 (×3): qty 60, 30d supply, fill #2

## 2023-11-04 MED ORDER — ACCU-CHEK GUIDE VI STRP: ORAL_STRIP | 12 refills | Status: DC

## 2023-11-04 MED ORDER — ACCU-CHEK GUIDE W/DEVICE KIT: PACK | 1 refills | Status: DC

## 2023-11-04 MED ORDER — FREESTYLE LIBRE 3 READER DEVI: 1.0000 | 1 refills | Status: DC

## 2023-11-04 MED ORDER — OXYCODONE HCL 5 MG PO TABS
5.0000 mg | ORAL_TABLET | ORAL | 0 refills | Status: DC | PRN
  Filled 2023-11-09: qty 120, 20d supply, fill #0

## 2023-11-04 MED ORDER — ALBUTEROL SULFATE HFA 108 (90 BASE) MCG/ACT IN AERS
2.0000 | INHALATION_SPRAY | Freq: Four times a day (QID) | RESPIRATORY_TRACT | 3 refills | Status: AC | PRN
  Filled 2023-11-30: qty 20.1, 75d supply, fill #0
  Filled 2024-02-08: qty 20.1, 75d supply, fill #1
  Filled 2024-06-22 – 2024-10-06 (×2): qty 20.1, 75d supply, fill #0

## 2023-11-04 MED ORDER — OXYBUTYNIN CHLORIDE ER 10 MG PO TB24
10.0000 mg | ORAL_TABLET | Freq: Every day | ORAL | 3 refills | Status: DC
  Filled 2023-11-04: qty 30, 30d supply, fill #0
  Filled 2023-11-30 – 2023-12-01 (×2): qty 30, 30d supply, fill #1
  Filled 2023-12-15 – 2023-12-24 (×3): qty 30, 30d supply, fill #2
  Filled 2024-01-15 – 2024-01-19 (×2): qty 30, 30d supply, fill #3
  Filled 2024-02-04 – 2024-02-16 (×3): qty 30, 30d supply, fill #4

## 2023-11-04 MED ORDER — PREGABALIN 200 MG PO CAPS
200.0000 mg | ORAL_CAPSULE | Freq: Three times a day (TID) | ORAL | 1 refills | Status: DC
  Filled 2023-11-04: qty 270, 90d supply, fill #0

## 2023-11-04 MED ORDER — ONDANSETRON 4 MG PO TBDP
4.0000 mg | ORAL_TABLET | Freq: Three times a day (TID) | ORAL | 0 refills | Status: DC | PRN
  Filled 2023-11-04: qty 20, 7d supply, fill #0

## 2023-11-04 NOTE — Telephone Encounter (Signed)
Pt stated she uses [facility] b/c they will deliver her meds. And she only uses [facility] for Oxycodone. Also requested something for nausea. Next appt scheduled 11/26 with Dr Ned Card.

## 2023-11-04 NOTE — Telephone Encounter (Signed)
Pt states she Spoke Corporate treasurer and asked her to send the following medications to the following pharmacy, and to not send the following medications to the [facility] pharmacy.  PT states she is completley out of her Albuterol medication and she really needs it.  Continuous Glucose Receiver Use to continuously monitor blood glucose  Blood Glucose Monitoring Suppl Use to check blood sugar up to 3 times a day  Glucose Blood Check blood sugar 3 times per day  Lancets Use to check blood sugar up to 3 times a day  Albuterol Sulfate INHALE 2 PUFFS BY MOUTH EVERY 6 HOURS AS NEEDED FOR WHEEZING Medication List  [facility] [address] [place],  [place]  [address] Main: [phone]

## 2023-11-05 ENCOUNTER — Other Ambulatory Visit: Payer: Self-pay

## 2023-11-06 ENCOUNTER — Other Ambulatory Visit: Payer: Self-pay

## 2023-11-06 NOTE — Progress Notes (Signed)
In response to coding query, viral panel ordered to determine if patient had specific viral illness.

## 2023-11-09 ENCOUNTER — Other Ambulatory Visit (HOSPITAL_BASED_OUTPATIENT_CLINIC_OR_DEPARTMENT_OTHER): Payer: Self-pay

## 2023-11-09 ENCOUNTER — Other Ambulatory Visit (HOSPITAL_COMMUNITY): Payer: Self-pay

## 2023-11-09 ENCOUNTER — Other Ambulatory Visit: Payer: Self-pay

## 2023-11-10 ENCOUNTER — Other Ambulatory Visit (HOSPITAL_COMMUNITY): Payer: Self-pay

## 2023-11-13 ENCOUNTER — Telehealth: Payer: Self-pay | Admitting: *Deleted

## 2023-11-13 NOTE — Telephone Encounter (Signed)
Transition Care Management Unsuccessful Follow-up Telephone Call  Date of discharge and from where:  [facility]  10/31/2023  Attempts:  1st Attempt  Reason for unsuccessful TCM follow-up call:  Left voice message

## 2023-11-17 ENCOUNTER — Other Ambulatory Visit: Payer: Self-pay | Admitting: Dietician

## 2023-11-17 ENCOUNTER — Other Ambulatory Visit: Payer: Self-pay

## 2023-11-17 ENCOUNTER — Ambulatory Visit (INDEPENDENT_AMBULATORY_CARE_PROVIDER_SITE_OTHER): Payer: 59 | Admitting: Dietician

## 2023-11-17 ENCOUNTER — Other Ambulatory Visit (HOSPITAL_COMMUNITY): Payer: Self-pay

## 2023-11-17 ENCOUNTER — Ambulatory Visit: Payer: 59 | Admitting: Internal Medicine

## 2023-11-17 VITALS — BP 136/47 | HR 69 | Temp 98.1°F | Ht 65.0 in | Wt 223.5 lb

## 2023-11-17 DIAGNOSIS — E118 Type 2 diabetes mellitus with unspecified complications: Secondary | ICD-10-CM | POA: Diagnosis not present

## 2023-11-17 DIAGNOSIS — Z794 Long term (current) use of insulin: Secondary | ICD-10-CM

## 2023-11-17 DIAGNOSIS — Z7984 Long term (current) use of oral hypoglycemic drugs: Secondary | ICD-10-CM

## 2023-11-17 DIAGNOSIS — E119 Type 2 diabetes mellitus without complications: Secondary | ICD-10-CM

## 2023-11-17 LAB — GLUCOSE, CAPILLARY: Glucose-Capillary: 174 mg/dL — ABNORMAL HIGH (ref 70–99)

## 2023-11-17 LAB — POCT GLYCOSYLATED HEMOGLOBIN (HGB A1C): Hemoglobin A1C: 9.6 % — AB (ref 4.0–5.6)

## 2023-11-17 MED ORDER — DEXCOM G7 SENSOR MISC
3 refills | Status: DC
  Filled 2023-11-17: qty 9, 90d supply, fill #0
  Filled 2024-02-09: qty 9, 90d supply, fill #1
  Filled 2024-05-09: qty 9, 90d supply, fill #2
  Filled 2024-08-08: qty 9, 90d supply, fill #3

## 2023-11-17 MED ORDER — ACCU-CHEK GUIDE W/DEVICE KIT
PACK | 1 refills | Status: DC
  Filled 2023-11-17: qty 1, 30d supply, fill #0

## 2023-11-17 MED ORDER — ACCU-CHEK GUIDE TEST VI STRP
ORAL_STRIP | 3 refills | Status: DC
  Filled 2023-11-17: qty 100, 90d supply, fill #0
  Filled 2024-02-09: qty 100, 90d supply, fill #1
  Filled 2024-05-09: qty 100, 90d supply, fill #2
  Filled 2024-08-08: qty 100, 90d supply, fill #3

## 2023-11-17 MED ORDER — ACCU-CHEK SOFTCLIX LANCETS MISC
3 refills | Status: DC
  Filled 2023-11-17: qty 100, 90d supply, fill #0
  Filled 2024-02-09: qty 100, 90d supply, fill #1
  Filled 2024-05-09: qty 100, 90d supply, fill #2
  Filled 2024-08-08: qty 100, 90d supply, fill #3

## 2023-11-17 NOTE — Patient Instructions (Signed)
Thank you, Ms.Naliyah L Alberici for allowing [place] to provide your care today. Today we discussed:  Your diabetes has been under good control for a long time, except today your A1c was higher than it usually is. Keep taking your Tresiba (65 units daily) and check your blood sugars.  I am glad you are meeting with [name] so she can help you get your meter on your phone! If your A1c stays high, I would really consider starting you on another type of insulin that you would take with every meal that you take. I know you were hesitant to do this today, but I would talk to Dr. Mayford Knife and [name] about this  I have ordered the following labs for you:   Lab Orders         POC Hbg A1C      Referrals ordered today:   Referral Orders  No referral(s) requested today     I have ordered the following medication/changed the following medications:   Stop the following medications: There are no discontinued medications.   Start the following medications: No orders of the defined types were placed in this encounter.    Follow up:  2-4 weeks with PCP     Should you have any questions or concerns please call the internal medicine clinic at [phone].     Elza Rafter, D.O. [facility]

## 2023-11-17 NOTE — Patient Instructions (Addendum)
We started a Dexcom G7 sensor with your phone today.   We'll request more sensors and a meter and supplies to be sent to [facility]  Per Dr. Ned Card, request another Ozempic pen and restart it as soon as possible.   Call us for any low blood sugars.   Follow up with your doctor.   [name] [phone]

## 2023-11-17 NOTE — Telephone Encounter (Signed)
Patient Requests meter, sensors and supplies

## 2023-11-17 NOTE — Assessment & Plan Note (Addendum)
Jennifer [name] is a 62 yo female with insulin dependent type 2 diabetes that has previously been well controlled dating back to at least 2022. Today, her A1c is 9.6% (aberrant?). She is on [place] daily (prescribed 58u daily but has been taking 65u daily), metformin 500 mg daily, and semaglutide 1 mg/week. She had been taken off her GLP-1 agonist previously, as it was thought that this was contributing to her nausea and vomiting, but she did not have any improvement in her symptoms off of the medication, nor did she have any worsening of her symptoms after resuming the medication. She does occasionally miss doses of semaglutide, stating that her last dose was 3 weeks ago. Aside from this, she reports adherence with taking her insulin daily and her metformin. She denies any recent changes in her diet, as well.   Jennifer [name] was not checking her blood sugars regularly prior to today's visit, as she was having difficulty with her CGM. She had an appt today with [name] and had a CGM placed, so now she will be able to monitor her blood sugars regularly. We did discuss starting meal time insulin for her, as her A1c is above goal and she is already on a very high dose of long acting insulin, raising suspicion that her fasting CBGs are not the issue, but rather her meal time ones. The patient is hesitant to make this change at this time, and I advised her that she can talk to [name] and Dr. Mayford Knife about this in more details, especially if her A1c does remain above goal.   Plan: - Continue Evaristo Bury 65u daily - Continue Metformin 500 mg daily - Continue Semaglutide 1 mg/week (if she misses another dose, will have to restart at 0.25 mg/week dose) - Follow up with PCP in 2-4 weeks; if CBGs remain above goal, could consider starting meal time insulin

## 2023-11-17 NOTE — Progress Notes (Signed)
CC: DM  HPI:  Jennifer [name] is a 62 y.o. female living with a history stated below and presents today for a follow up of her diabetes. Please see problem based assessment and plan for additional details.  Past Medical History:  Diagnosis Date   Acute vestibular syndrome, resolved 03/02/2021   Angioedema 06/14/2021   Asthma    Burn of finger of right hand, second degree 02/05/2021   Occurred during cooking (frying), poor sensation due to neuropathy, pt punctured blister to allow it to drain, skin has since desquamated over dorsal joint, no infection.  Keep clean and dry, OTC antibacterial ointment.   Cataract    CHF (congestive heart failure) (HCC)    Chronic bronchitis (HCC)    "I get it alot" (09/28/2013)   Chronic diastolic heart failure (HCC)    grade 2 per 2D echocardiogram (01/2013)   Chronic kidney disease    Chronic lower back pain    Chronic pain syndrome 12/03/2011   Likely secondary to depression, "fibromyalgia", neuropathy, and obesity. Lumbar MRI 2014 no sig change from prior (2008) : Stable hypertrophic facet disease most notable at L4-5. Stable shallow left foraminal/extraforaminal disc protrusion at L4-5. No direct neural compression.       Chronic right shoulder pain 10/10/2021   COPD 01/08/2007   PFT's 05/2007 : FEV1/FVC 82, FEV1 64% pred, FEF 25-75% 40% predicted, 16% improvement in FEV1 with bronchodilators.      Depression    Diabetic peripheral neuropathy (HCC)    Dizziness, resolved (admitted with vestibular migraine)    DVT of upper extremity (deep vein thrombosis) (HCC) 03/11/2013   Secondary to PICC line. Right brachial vein, diagnosed on 03/10/2013 Coumadin for 3 months. 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  S. Aureus and GBS.    History of use of hearing aid    Hyperlipidemia    Hyperplastic colon polyp 12/2010   Per colonoscopy (12/2010) - Dr. 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       Initially evaluated 08/2019 with dedicated MRI: "IMPRESSION:  Skin marker overlies a 9 x 10 x 11 mm cyst within the anterior  aspect of the superficial lobe of the right parotid gland. This is  presumed to be a benign cyst. The possibility of a cystic Warthin's  tumor   Pneumonia    PVD (peripheral vascular disease) with claudication (HCC)    Stents to bilateral common iliac arteries (left 2005, right 2008), on chronic plavix   Pyelonephritis 10/28/2020   S/P BKA (below knee amputation) unilateral (HCC)    2014 L - failed limb preserving treatment. 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  Evaluated on 07/08/2021 by podiatrist Dr. Logan Bores who debrided necrotic tissue and prescribed doxycycline.  He will see her again in 3 weeks.  The location of this ulcer on the dorsal aspect of the toe is somewhat atypical for a purely diabetic foot wound, and she does have a strong DP pulse.  I did not examine h   Type II diabetes mellitus with peripheral circulatory disorders, uncontrolled DX:  1993   Insulin dep. Poor control. Complicated by diabetic foot ulcer and diabetic eye disease.     Upper respiratory tract infection due to COVID-19 virus 07/22/2022   Urinary incontinence, severe, mixed (stress, urge, functional) 02/14/2023   Unresponsive to medications    Current Outpatient Medications on File Prior to Visit  Medication Sig Dispense Refill   acetaminophen (TYLENOL) 650 MG CR tablet Take 2 tablets (1,300 mg total) by mouth every 8 (eight) hours as needed for pain. 30 tablet 0   albuterol (PROAIR HFA) 108 (90 Base) MCG/ACT inhaler INHALE 2 PUFFS BY MOUTH EVERY 6 HOURS AS NEEDED FOR WHEEZING 54 g 3   amLODipine (NORVASC) 10 MG tablet Take 1 tablet (10 mg total) by mouth daily. 90 tablet 3   aspirin EC 81 MG tablet Take 1 tablet (81 mg total) by mouth daily. 30 tablet    Baclofen 5 MG TABS Take 1 tablet (5 mg total) by mouth 2 (two) times daily. 180 tablet 3   Blood Glucose Monitoring Suppl (ACCU-CHEK GUIDE) w/Device KIT Use to check blood sugar up to 3 times a day 1 kit 1   Continuous Glucose Receiver (FREESTYLE LIBRE 3 READER) DEVI Use to continuously monitor blood glucose 1 each 1   Continuous Glucose Sensor (FREESTYLE LIBRE 3 SENSOR) MISC Place 1 sensor on the skin every 14 days. Use to check glucose continuously 6 each 3   DULoxetine (CYMBALTA) 30 MG capsule Take 1 capsule (30 mg total) by mouth 2 (two) times daily. 180 capsule 1   glucose blood (ACCU-CHEK GUIDE) test strip Check blood sugar 3 times per day 100 each 12   insulin degludec (TRESIBA FLEXTOUCH) 200 UNIT/ML FlexTouch Pen Inject 58 Units into the skin daily. 9 mL 3   Insulin Pen Needle 32G X 4 MM MISC Use to inject insulin 4 (four) times daily. 100 each 10   loperamide (IMODIUM A-D) 2 MG capsule Take 1 capsule (2 mg total) by mouth as needed for diarrhea or loose stools.     metFORMIN (GLUCOPHAGE-XR) 500 MG 24 hr tablet Take 1 tablet (500 mg total) by mouth daily with breakfast. 180 tablet 3   metoCLOPramide  (REGLAN) 5 MG tablet Take 1 tablet (5 mg total) by mouth 3 (three) times daily before meals. 90 tablet 1   metoprolol succinate (TOPROL-XL) 100 MG 24 hr tablet Take 1 tablet (100 mg total) by mouth daily. TAKE WITH OR IMMEDIATELY FOLLOWING A MEAL. 90 tablet 3   Multiple Vitamin (MULTIVITAMIN) tablet Take 1 tablet by mouth daily.     MYRBETRIQ 50 MG TB24 tablet Take 1 tablet (50 mg total) by mouth daily. 90 tablet 3   nystatin cream (MYCOSTATIN) Apply 1 Application topically 2 (two) times daily. 30 g 0   nystatin powder Apply 1 Application topically 3 (three) times  daily. 15 g 0   ondansetron (ZOFRAN-ODT) 4 MG disintegrating tablet Take 1 tablet (4 mg total) by mouth every 8 (eight) hours as needed for nausea or vomiting. 20 tablet 0   OVER THE COUNTER MEDICATION Take 1 tablet by mouth daily. Brain Health otc supplement     oxybutynin (DITROPAN-XL) 10 MG 24 hr tablet Take 1 tablet (10 mg total) by mouth daily. 90 tablet 3   oxyCODONE (OXY IR/ROXICODONE) 5 MG immediate release tablet Take 1 tablet (5 mg total) by mouth every 4 (four) hours as needed for severe pain (pain score 7-10). 120 tablet 0   pantoprazole (PROTONIX) 40 MG tablet Take 1 tablet (40 mg total) by mouth daily. 90 tablet 3   pregabalin (LYRICA) 200 MG capsule Take 1 capsule (200 mg total) by mouth 3 (three) times daily. 270 capsule 1   rosuvastatin (CRESTOR) 20 MG tablet Take 1 tablet (20 mg total) by mouth in the morning. 90 tablet 3   Semaglutide, 1 MG/DOSE, (OZEMPIC, 1 MG/DOSE,) 4 MG/3ML SOPN Inject 1 mg into the skin once a week. 3 mL 12   spironolactone (ALDACTONE) 25 MG tablet Take 2 tablets (50 mg total) by mouth daily. 30 tablet 5   traZODone (DESYREL) 100 MG tablet Take 1 tablet (100 mg total) by mouth at bedtime as needed for sleep. 60 tablet 11   umeclidinium-vilanterol (ANORO ELLIPTA) 62.5-25 MCG/ACT AEPB Inhale 1 puff into the lungs in the morning. 120 each 11   [DISCONTINUED] Potassium Chloride ER 20 MEQ TBCR Take 40 mEq  by mouth daily. 30 tablet 0   No current facility-administered medications on file prior to visit.    Family History  Problem Relation Age of Onset   Diverticulosis Mother    Diabetes Mother    Hypertension Mother    Congestive Heart Failure Mother    Asthma Father    CAD Sister 3       MI at age 20 per patient.  However, she has not had a stent or CABG.    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   Vaping Use   Vaping status: Never Used  Substance and Sexual Activity   Alcohol use: No    Alcohol/week: 0.0 standard drinks of alcohol   Drug use: No    Types: Marijuana, "Crack" cocaine    Comment: 09/28/2013 "no marijuana since 2011, no crack/cocaine 1989"   Sexual activity: Not Currently  Other Topics Concern   Not on file  Social History Narrative   On  disability. Lives with son in [place]. 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    Boyfriend passed away stage 4 cancer Mar 01, 2013.   S/p L BKA 2014. In wheelchair in paritially suitable apartment.    Social Determinants of Health   Financial Resource Strain: High Risk (03/31/2023)   Overall Financial Resource Strain (CARDIA)    Difficulty of Paying Living Expenses: Hard  Food Insecurity: Food Insecurity Present  (03/31/2023)   Hunger Vital Sign    Worried About Running Out of Food in the Last Year: Often true    Ran Out of Food in the Last Year: Often true  Transportation Needs: Unmet Transportation Needs (03/31/2023)   PRAPARE - Administrator, Civil Service (Medical): Yes    Lack of Transportation (Non-Medical): Yes  Physical Activity: Inactive (03/31/2023)   Exercise Vital Sign    Days of Exercise per Week: 0 days    Minutes of Exercise per Session: 0 min  Stress: Stress Concern Present (03/31/2023)   [employer] of Occupational Health - Occupational Stress Questionnaire    Feeling of Stress : Very much  Social Connections: Moderately Integrated (03/31/2023)   Social Connection and Isolation Panel [NHANES]    Frequency of Communication with Friends and Family: More than three times a week    Frequency of Social Gatherings with Friends and Family: Once a week    Attends Religious Services: 1 to 4 times per year    Active Member of [employer] or Organizations: Yes    Attends Banker Meetings: 1 to 4 times per year    Marital Status: Divorced  Catering manager Violence: Not At Risk (03/31/2023)   Humiliation, Afraid, Rape, and Kick questionnaire    Fear of Current or Ex-Partner: No    Emotionally Abused: No    Physically Abused: No    Sexually Abused: No    Review of Systems: ROS negative except for what is noted on the assessment and plan.  Vitals:   11/17/23 0833  BP: (!) 136/47  Pulse: 69  Temp: 98.1 F (36.7 C)  TempSrc: Oral  SpO2: 94%  Weight: 223 lb 8 oz (101.4 kg)  Height: 5\' 5"  (1.651 m)    Physical Exam: Constitutional: comfortable appearing, sitting in wheelchair Cardiovascular: regular rate and rhythm, no m/r/g Pulmonary/Chest: normal work of breathing on room air Abdominal: soft, non-tender, non-distended MSK:S/p left AKA. Tight edema of right lower extremity Psych: normal mood and behavior  Assessment & Plan:   Patient discussed with Dr.  Oswaldo Done  Diabetes mellitus type 2, controlled, with complications [facility]) Ms. Cerio is a 62 yo female with insulin dependent type 2 diabetes that has previously been well controlled dating back to at least 2022. Today, her A1c is 9.6% (aberrant?). She is on [place] daily (prescribed 58u daily but has been taking 65u daily), metformin 500 mg daily, and semaglutide 1 mg/week. She had been taken off her GLP-1 agonist previously, as it was thought that this was contributing to her nausea and vomiting, but she did not have any improvement in her symptoms off of the medication, nor did she have any worsening of her symptoms after resuming the medication. She does occasionally miss doses of semaglutide, stating that her last dose was 3 weeks ago. Aside from this, she reports adherence with taking her insulin daily and her metformin. She denies any recent changes in her diet, as well.   Ms. Robert was not checking her blood sugars regularly prior to today's visit, as she was  having difficulty with her CGM. She had an appt today with [name] and had a CGM placed, so now she will be able to monitor her blood sugars regularly. We did discuss starting meal time insulin for her, as her A1c is above goal and she is already on a very high dose of long acting insulin, raising suspicion that her fasting CBGs are not the issue, but rather her meal time ones. The patient is hesitant to make this change at this time, and I advised her that she can talk to [name] and Dr. Mayford Knife about this in more details, especially if her A1c does remain above goal.   Plan: - Continue Tresiba 65u daily - Continue Metformin 500 mg daily - Continue Semaglutide 1 mg/week (if she misses another dose, will have to restart at 0.25 mg/week dose) - Follow up with PCP in 2-4 weeks; if CBGs remain above goal, could consider starting meal time insulin    Taylore Hinde, D.O. [facility], PGY-3 Phone: [phone] Date 11/17/2023  Time 11:07 AM
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
## 2023-11-18 NOTE — Progress Notes (Signed)
 [facility] Clinic Attending  Case discussed with the resident physician at the time of the visit.  We reviewed the patient's history, exam, and pertinent patient test results.  I agree with the assessment, diagnosis, and plan of care documented in the resident's note.

## 2023-11-24 ENCOUNTER — Other Ambulatory Visit: Payer: Self-pay

## 2023-11-30 ENCOUNTER — Other Ambulatory Visit: Payer: Self-pay | Admitting: Internal Medicine

## 2023-11-30 ENCOUNTER — Other Ambulatory Visit: Payer: Self-pay

## 2023-11-30 ENCOUNTER — Other Ambulatory Visit (HOSPITAL_COMMUNITY): Payer: Self-pay

## 2023-11-30 DIAGNOSIS — E119 Type 2 diabetes mellitus without complications: Secondary | ICD-10-CM

## 2023-11-30 MED ORDER — METOCLOPRAMIDE HCL 5 MG PO TABS
5.0000 mg | ORAL_TABLET | Freq: Three times a day (TID) | ORAL | 1 refills | Status: DC
  Filled 2023-11-30 – 2023-12-01 (×2): qty 90, 30d supply, fill #0
  Filled 2023-12-15 – 2023-12-24 (×3): qty 90, 30d supply, fill #1

## 2023-12-01 ENCOUNTER — Other Ambulatory Visit: Payer: Self-pay

## 2023-12-01 ENCOUNTER — Other Ambulatory Visit (HOSPITAL_COMMUNITY): Payer: Self-pay

## 2023-12-01 MED ORDER — ALBUTEROL SULFATE HFA 108 (90 BASE) MCG/ACT IN AERS
2.0000 | INHALATION_SPRAY | Freq: Four times a day (QID) | RESPIRATORY_TRACT | 3 refills | Status: DC | PRN
  Filled 2023-12-01: qty 20.1, 75d supply, fill #0

## 2023-12-02 ENCOUNTER — Other Ambulatory Visit: Payer: Self-pay

## 2023-12-02 DIAGNOSIS — G8929 Other chronic pain: Secondary | ICD-10-CM

## 2023-12-02 MED ORDER — OXYCODONE HCL 5 MG PO TABS
5.0000 mg | ORAL_TABLET | ORAL | 0 refills | Status: DC | PRN
  Filled 2023-12-09: qty 120, 20d supply, fill #0

## 2023-12-03 ENCOUNTER — Other Ambulatory Visit: Payer: Self-pay

## 2023-12-03 ENCOUNTER — Other Ambulatory Visit (HOSPITAL_COMMUNITY): Payer: Self-pay

## 2023-12-03 DIAGNOSIS — E119 Type 2 diabetes mellitus without complications: Secondary | ICD-10-CM

## 2023-12-03 MED ORDER — TRESIBA FLEXTOUCH 200 UNIT/ML ~~LOC~~ SOPN
58.0000 [IU] | PEN_INJECTOR | Freq: Every day | SUBCUTANEOUS | 3 refills | Status: DC
  Filled 2023-12-03: qty 9, 31d supply, fill #0
  Filled 2024-01-01: qty 9, 31d supply, fill #1
  Filled 2024-01-28: qty 9, 31d supply, fill #2

## 2023-12-08 ENCOUNTER — Telehealth: Payer: Self-pay | Admitting: Dietician

## 2023-12-08 ENCOUNTER — Encounter: Payer: 59 | Admitting: Dietician

## 2023-12-08 NOTE — Telephone Encounter (Signed)
Did not get a reader, got G7 sensors. Appointment for today moved to Thursday.

## 2023-12-09 ENCOUNTER — Other Ambulatory Visit: Payer: Self-pay

## 2023-12-10 ENCOUNTER — Ambulatory Visit (INDEPENDENT_AMBULATORY_CARE_PROVIDER_SITE_OTHER): Payer: 59 | Admitting: Dietician

## 2023-12-10 DIAGNOSIS — Z794 Long term (current) use of insulin: Secondary | ICD-10-CM | POA: Diagnosis not present

## 2023-12-10 DIAGNOSIS — E118 Type 2 diabetes mellitus with unspecified complications: Secondary | ICD-10-CM | POA: Diagnosis not present

## 2023-12-10 NOTE — Progress Notes (Signed)
Diabetes Self-Management Education  Visit Type: Annual Follow-Up  Appt. Start Time: 845 Appt. End Time: 925  12/10/2023  [name], identified by name and date of birth, is a 62 y.o. female with a diagnosis of Diabetes:  .   ASSESSMENT  She states she is having other issues at home that are causing her blood sugars to be higher. She did not specify. She states she has been taking her diabetes medicine as directed and thinks her insulin should be increased.  I encouraged her to continue taking what medicine is ordered and work on her stressors that are causing it to be high. Offered counseling with behavioral health.   CGM Results from download: 11/23-12/6  % Time CGM active:   40 %   (Goal >70%)  Average glucose:   258 mg/dL for 10 days  Glucose management indicator:   9.5 %  Time in range (70-180 mg/dL):   22 %   (Goal >16%)  Time High (181-250 mg/dL):   24 %   (Goal < 10%)  Time Very High (>250 mg/dL):    54 %   (Goal < 5%)  Time Low (54-69 mg/dL):   0 %   (Goal <9%)  Time Very Low (<54 mg/dL):   0 %   (Goal <6%)  %CV (glucose variability)    34 %  (Goal <36%)      Diabetes Self-Management Education - 12/10/23 0900       Visit Information   Visit Type Annual Follow-Up      Health Coping   How would you rate your overall health? Fair      Patient Education   Previous Diabetes Education Yes (please comment)    Monitoring Taught/evaluated CGM (comment);Interpreting lab values - A1C, lipid, urine microalbumina.   supported her placing & starting a dexcom sensor with a sample receiver given to her today. her phone worked intermittently. Reveiwed her data that showed higher blood sugars than usual. she states she take 65 units tresiba a day     Individualized Goals (developed by patient)   Monitoring  Consistenly use CGM      Patient Self-Evaluation of Goals - Patient rates self as meeting previously set goals (% of time)   Monitoring >75% (most of the time)       Post-Education Assessment   Patient understands monitoring blood glucose, interpreting and using results Demonstrates understanding / competency      Outcomes   Expected Outcomes Demonstrated interest in learning but significant barriers to change    Future DMSE 3-4 months    Program Status Completed      Subsequent Visit   Since your last visit have you continued or begun to take your medications as prescribed? Yes    Since your last visit have you had your blood pressure checked? No    Since your last visit have you experienced any weight changes? No change    Since your last visit, are you checking your blood glucose at least once a day? No   her sensor stopped and she was unable to restart it on her own. today's appointment was for help with that.            Individualized Plan for Diabetes Self-Management Training:   Learning Objective:  Patient will have a greater understanding of diabetes self-management. Patient education plan is to attend individual and/or group sessions per assessed needs and concerns.   Plan:   Patient Instructions   You  were given a Dexcom G7 receiver today. You started your Dexcom G7 Continuous glucose sensor with it. It was in warm up when we departed.   You can continue to get more sensors from your pharmacy and start them with this receiver.  Thank you for allowing me to participate in your care!  Hope you have happy holidays.  Call me if you have questions or concerns.  [name] [phone]     Expected Outcomes:  Demonstrated interest in learning but significant barriers to change  Education material provided: Diabetes Resources  If problems or questions, patient to contact team via:  Phone  Future DSME appointment: 3-4 months Norm Parcel, RD 12/10/2023 10:05 AM.

## 2023-12-10 NOTE — Patient Instructions (Signed)
You were given a Dexcom G7 receiver today. You started your Dexcom G7 Continuous glucose sensor with it. It was in warm up when we departed.   You can continue to get more sensors from your pharmacy and start them with this receiver.  Thank you for allowing me to participate in your care!  Hope you have happy holidays.  Call me if you have questions or concerns.  [name] [phone]

## 2023-12-14 NOTE — Telephone Encounter (Signed)
error 

## 2023-12-15 ENCOUNTER — Other Ambulatory Visit: Payer: Self-pay

## 2023-12-17 ENCOUNTER — Other Ambulatory Visit: Payer: Self-pay

## 2023-12-21 ENCOUNTER — Other Ambulatory Visit: Payer: Self-pay

## 2023-12-24 ENCOUNTER — Other Ambulatory Visit (HOSPITAL_COMMUNITY): Payer: Self-pay

## 2023-12-24 ENCOUNTER — Other Ambulatory Visit: Payer: Self-pay

## 2023-12-25 ENCOUNTER — Ambulatory Visit: Payer: 59 | Admitting: Podiatry

## 2023-12-29 ENCOUNTER — Other Ambulatory Visit: Payer: Self-pay

## 2023-12-29 ENCOUNTER — Other Ambulatory Visit (HOSPITAL_COMMUNITY): Payer: Self-pay

## 2023-12-30 ENCOUNTER — Other Ambulatory Visit: Payer: Self-pay

## 2024-01-01 ENCOUNTER — Other Ambulatory Visit: Payer: Self-pay

## 2024-01-05 ENCOUNTER — Other Ambulatory Visit (HOSPITAL_COMMUNITY): Payer: Self-pay
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
## 2024-01-07 ENCOUNTER — Encounter: Payer: 59 | Admitting: Internal Medicine

## 2024-01-11 ENCOUNTER — Telehealth: Payer: Self-pay | Admitting: Internal Medicine

## 2024-01-11 ENCOUNTER — Other Ambulatory Visit: Payer: Self-pay

## 2024-01-11 ENCOUNTER — Other Ambulatory Visit (HOSPITAL_COMMUNITY): Payer: Self-pay

## 2024-01-11 DIAGNOSIS — G8929 Other chronic pain: Secondary | ICD-10-CM

## 2024-01-11 MED ORDER — OXYCODONE HCL 5 MG PO TABS
5.0000 mg | ORAL_TABLET | ORAL | 0 refills | Status: DC | PRN
  Filled 2024-01-11: qty 120, 20d supply, fill #0
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
## 2024-01-12 ENCOUNTER — Other Ambulatory Visit: Payer: Self-pay

## 2024-01-15 ENCOUNTER — Other Ambulatory Visit: Payer: Self-pay | Admitting: Internal Medicine

## 2024-01-15 ENCOUNTER — Other Ambulatory Visit: Payer: Self-pay

## 2024-01-15 DIAGNOSIS — Z794 Long term (current) use of insulin: Secondary | ICD-10-CM

## 2024-01-15 DIAGNOSIS — I152 Hypertension secondary to endocrine disorders: Secondary | ICD-10-CM

## 2024-01-18 ENCOUNTER — Other Ambulatory Visit (HOSPITAL_COMMUNITY): Payer: Self-pay

## 2024-01-18 ENCOUNTER — Other Ambulatory Visit: Payer: Self-pay

## 2024-01-18 ENCOUNTER — Ambulatory Visit: Payer: 59 | Admitting: Podiatry

## 2024-01-18 MED ORDER — SPIRONOLACTONE 25 MG PO TABS
50.0000 mg | ORAL_TABLET | Freq: Every day | ORAL | 5 refills | Status: DC
  Filled 2024-01-18 – 2024-01-19 (×2): qty 30, 15d supply, fill #0
  Filled 2024-02-04 – 2024-02-16 (×3): qty 30, 15d supply, fill #1

## 2024-01-18 MED ORDER — METOCLOPRAMIDE HCL 5 MG PO TABS
5.0000 mg | ORAL_TABLET | Freq: Three times a day (TID) | ORAL | 1 refills | Status: DC
  Filled 2024-01-18 – 2024-01-19 (×2): qty 90, 30d supply, fill #0
  Filled 2024-02-04 – 2024-02-16 (×3): qty 90, 30d supply, fill #1

## 2024-01-19 ENCOUNTER — Other Ambulatory Visit: Payer: Self-pay

## 2024-01-19 ENCOUNTER — Other Ambulatory Visit (HOSPITAL_COMMUNITY): Payer: Self-pay

## 2024-01-25 ENCOUNTER — Telehealth: Payer: Self-pay | Admitting: Podiatry

## 2024-01-25 NOTE — Telephone Encounter (Signed)
Pt left message on today at 1210pm asking about her appt time.  I returned call and left message for pt appt is 2/10 at 1115 and to call if any questions.

## 2024-01-27 ENCOUNTER — Other Ambulatory Visit: Payer: Self-pay

## 2024-01-28 ENCOUNTER — Other Ambulatory Visit: Payer: Self-pay

## 2024-02-01 ENCOUNTER — Ambulatory Visit: Payer: 59 | Admitting: Podiatry

## 2024-02-04 ENCOUNTER — Other Ambulatory Visit: Payer: Self-pay

## 2024-02-08 ENCOUNTER — Other Ambulatory Visit: Payer: Self-pay | Admitting: Internal Medicine

## 2024-02-08 ENCOUNTER — Other Ambulatory Visit (HOSPITAL_COMMUNITY): Payer: Self-pay

## 2024-02-08 DIAGNOSIS — G8929 Other chronic pain: Secondary | ICD-10-CM

## 2024-02-09 ENCOUNTER — Other Ambulatory Visit: Payer: Self-pay

## 2024-02-09 ENCOUNTER — Encounter: Payer: Self-pay | Admitting: Podiatry

## 2024-02-09 ENCOUNTER — Ambulatory Visit (INDEPENDENT_AMBULATORY_CARE_PROVIDER_SITE_OTHER): Payer: 59 | Admitting: Podiatry

## 2024-02-09 DIAGNOSIS — E1142 Type 2 diabetes mellitus with diabetic polyneuropathy: Secondary | ICD-10-CM

## 2024-02-09 DIAGNOSIS — Z89512 Acquired absence of left leg below knee: Secondary | ICD-10-CM | POA: Diagnosis not present

## 2024-02-09 DIAGNOSIS — Z89431 Acquired absence of right foot: Secondary | ICD-10-CM | POA: Diagnosis not present

## 2024-02-09 NOTE — Progress Notes (Unsigned)
Subjective: Chief Complaint  Patient presents with   [facility]    RM#[id number] [facility] / PICK UP SHOES     Jennifer [name] is a 63 y.o. is seen today in office s/p right transmetatarsal amputation preformed on 02/12/2023.  States that she has been doing well.  She presents today to PUDS/inserts.  No open lesions that she reports.  She has no new concerns today.  Objective: General: No acute distress, AAOx3 -son is present. DP/PT pulses palpable , CRT < 3 sec to all digits.  Right foot: Incision is well coapted without any evidence of dehiscence.  Scar is well-formed.  There is dry skin present at the foot but there is no open lesions or any evidence of skin breakdown.  There is no erythema or warmth or any obvious signs of infection. LEFT BKA No pain with calf compression, swelling, warmth, erythema.   Assessment and Plan:  History of right transmetatarsal amputation  -Right foot is doing well, no signs of infection or skin breakdown.  Discussed moisturizer daily. -Diabetic shoes, inserts with toe filler was dispensed.  Did modify the toe filler on the right foot so that we will appropriately without any rubbing.  Break-in instructions were discussed with the patient and discussed daily foot inspection.  Should there be any changes or the irritation of the skin to stop redness she does not me know immediately.  She verbalized understanding had no further questions.  Return in about 3 months (around 05/08/2024) for foot exam.  [name] DPM

## 2024-02-10 ENCOUNTER — Other Ambulatory Visit (HOSPITAL_COMMUNITY): Payer: Self-pay

## 2024-02-10 ENCOUNTER — Other Ambulatory Visit: Payer: Self-pay | Admitting: Internal Medicine

## 2024-02-10 DIAGNOSIS — G8929 Other chronic pain: Secondary | ICD-10-CM

## 2024-02-11 ENCOUNTER — Other Ambulatory Visit (HOSPITAL_COMMUNITY): Payer: Self-pay

## 2024-02-11 ENCOUNTER — Other Ambulatory Visit: Payer: Self-pay

## 2024-02-11 MED ORDER — OXYCODONE HCL 5 MG PO TABS
5.0000 mg | ORAL_TABLET | ORAL | 0 refills | Status: DC | PRN
  Filled 2024-02-11: qty 120, 20d supply, fill #0

## 2024-02-12 ENCOUNTER — Other Ambulatory Visit (HOSPITAL_COMMUNITY): Payer: Self-pay

## 2024-02-12 ENCOUNTER — Other Ambulatory Visit: Payer: Self-pay

## 2024-02-15 ENCOUNTER — Other Ambulatory Visit: Payer: Self-pay

## 2024-02-16 ENCOUNTER — Other Ambulatory Visit: Payer: Self-pay

## 2024-02-17 ENCOUNTER — Other Ambulatory Visit: Payer: Self-pay

## 2024-02-18 ENCOUNTER — Encounter: Payer: Self-pay | Admitting: Internal Medicine

## 2024-02-18 ENCOUNTER — Other Ambulatory Visit (HOSPITAL_COMMUNITY): Payer: Self-pay

## 2024-02-18 ENCOUNTER — Ambulatory Visit: Payer: 59 | Admitting: Internal Medicine

## 2024-02-18 ENCOUNTER — Other Ambulatory Visit: Payer: Self-pay

## 2024-02-18 VITALS — BP 142/70 | HR 66 | Temp 98.2°F | Ht 65.0 in | Wt 226.8 lb

## 2024-02-18 DIAGNOSIS — M62838 Other muscle spasm: Secondary | ICD-10-CM

## 2024-02-18 DIAGNOSIS — I872 Venous insufficiency (chronic) (peripheral): Secondary | ICD-10-CM

## 2024-02-18 DIAGNOSIS — E1169 Type 2 diabetes mellitus with other specified complication: Secondary | ICD-10-CM

## 2024-02-18 DIAGNOSIS — E119 Type 2 diabetes mellitus without complications: Secondary | ICD-10-CM

## 2024-02-18 DIAGNOSIS — E118 Type 2 diabetes mellitus with unspecified complications: Secondary | ICD-10-CM | POA: Diagnosis not present

## 2024-02-18 DIAGNOSIS — E1159 Type 2 diabetes mellitus with other circulatory complications: Secondary | ICD-10-CM | POA: Diagnosis not present

## 2024-02-18 DIAGNOSIS — Z89431 Acquired absence of right foot: Secondary | ICD-10-CM | POA: Diagnosis not present

## 2024-02-18 DIAGNOSIS — Z794 Long term (current) use of insulin: Secondary | ICD-10-CM | POA: Diagnosis not present

## 2024-02-18 DIAGNOSIS — E1121 Type 2 diabetes mellitus with diabetic nephropathy: Secondary | ICD-10-CM

## 2024-02-18 DIAGNOSIS — I499 Cardiac arrhythmia, unspecified: Secondary | ICD-10-CM | POA: Diagnosis not present

## 2024-02-18 DIAGNOSIS — Z89612 Acquired absence of left leg above knee: Secondary | ICD-10-CM

## 2024-02-18 DIAGNOSIS — E1122 Type 2 diabetes mellitus with diabetic chronic kidney disease: Secondary | ICD-10-CM

## 2024-02-18 DIAGNOSIS — Z7985 Long-term (current) use of injectable non-insulin antidiabetic drugs: Secondary | ICD-10-CM

## 2024-02-18 DIAGNOSIS — F17210 Nicotine dependence, cigarettes, uncomplicated: Secondary | ICD-10-CM | POA: Diagnosis not present

## 2024-02-18 DIAGNOSIS — Z720 Tobacco use: Secondary | ICD-10-CM

## 2024-02-18 DIAGNOSIS — R112 Nausea with vomiting, unspecified: Secondary | ICD-10-CM | POA: Diagnosis not present

## 2024-02-18 DIAGNOSIS — N3946 Mixed incontinence: Secondary | ICD-10-CM | POA: Diagnosis not present

## 2024-02-18 DIAGNOSIS — N182 Chronic kidney disease, stage 2 (mild): Secondary | ICD-10-CM | POA: Diagnosis not present

## 2024-02-18 DIAGNOSIS — I152 Hypertension secondary to endocrine disorders: Secondary | ICD-10-CM | POA: Diagnosis not present

## 2024-02-18 LAB — POCT GLYCOSYLATED HEMOGLOBIN (HGB A1C): Hemoglobin A1C: 9.2 % — AB (ref 4.0–5.6)

## 2024-02-18 LAB — GLUCOSE, CAPILLARY: Glucose-Capillary: 211 mg/dL — ABNORMAL HIGH (ref 70–99)

## 2024-02-18 MED ORDER — INSULIN PEN NEEDLE 32G X 4 MM MISC
Freq: Four times a day (QID) | 10 refills | Status: AC
  Filled 2024-02-18: qty 100, 25d supply, fill #0
  Filled 2024-07-05 (×2): qty 100, 25d supply, fill #1

## 2024-02-18 MED ORDER — ONDANSETRON 4 MG PO TBDP
4.0000 mg | ORAL_TABLET | Freq: Three times a day (TID) | ORAL | 0 refills | Status: DC | PRN
  Filled 2024-02-18: qty 20, 7d supply, fill #0

## 2024-02-18 MED ORDER — MYRBETRIQ 50 MG PO TB24
50.0000 mg | ORAL_TABLET | Freq: Every day | ORAL | 3 refills | Status: DC
  Filled 2024-02-18: qty 30, 30d supply, fill #0
  Filled 2024-02-25 – 2024-03-14 (×2): qty 30, 30d supply, fill #1
  Filled 2024-04-06 – 2024-04-07 (×2): qty 30, 30d supply, fill #2
  Filled 2024-04-25 – 2024-05-03 (×3): qty 30, 30d supply, fill #3
  Filled 2024-06-16: qty 30, 30d supply, fill #4
  Filled 2024-07-19: qty 30, 30d supply, fill #5
  Filled 2024-08-18: qty 30, 30d supply, fill #6
  Filled 2024-09-20 (×2): qty 30, 30d supply, fill #7

## 2024-02-18 MED ORDER — SPIRONOLACTONE 50 MG PO TABS
50.0000 mg | ORAL_TABLET | Freq: Every day | ORAL | 3 refills | Status: DC
  Filled 2024-02-18: qty 90, 90d supply, fill #0
  Filled 2024-05-12: qty 90, 90d supply, fill #1
  Filled 2024-07-19: qty 90, 90d supply, fill #2

## 2024-02-18 MED ORDER — TRESIBA FLEXTOUCH 200 UNIT/ML ~~LOC~~ SOPN
65.0000 [IU] | PEN_INJECTOR | Freq: Every day | SUBCUTANEOUS | 3 refills | Status: DC
Start: 2024-02-18 — End: 2024-05-30
  Filled 2024-02-18 – 2024-02-22 (×2): qty 9, 27d supply, fill #0
  Filled 2024-03-14: qty 9, 27d supply, fill #1
  Filled 2024-04-06 – 2024-04-07 (×2): qty 9, 27d supply, fill #2
  Filled 2024-05-07: qty 9, 27d supply, fill #3

## 2024-02-18 MED ORDER — BACLOFEN 5 MG PO TABS
5.0000 mg | ORAL_TABLET | Freq: Two times a day (BID) | ORAL | 3 refills | Status: DC | PRN
  Filled 2024-02-18 – 2024-06-22 (×3): qty 180, 90d supply, fill #0

## 2024-02-18 MED ORDER — ROSUVASTATIN CALCIUM 20 MG PO TABS
20.0000 mg | ORAL_TABLET | Freq: Every morning | ORAL | 3 refills | Status: AC
  Filled 2024-02-18: qty 30, 30d supply, fill #0
  Filled 2024-02-25 – 2024-03-14 (×2): qty 30, 30d supply, fill #1
  Filled 2024-04-06 – 2024-04-07 (×2): qty 30, 30d supply, fill #2
  Filled 2024-04-25 – 2024-05-03 (×3): qty 30, 30d supply, fill #3
  Filled 2024-06-16: qty 30, 30d supply, fill #4
  Filled 2024-07-19: qty 30, 30d supply, fill #5
  Filled 2024-08-18: qty 30, 30d supply, fill #6
  Filled 2024-09-20 (×2): qty 30, 30d supply, fill #7
  Filled 2024-10-18: qty 30, 30d supply, fill #8
  Filled 2024-11-09 – 2024-11-10 (×2): qty 30, 30d supply, fill #9
  Filled 2024-12-23: qty 30, 30d supply, fill #10
  Filled 2025-01-22: qty 30, 30d supply, fill #11

## 2024-02-18 MED ORDER — METOPROLOL SUCCINATE ER 100 MG PO TB24
100.0000 mg | ORAL_TABLET | Freq: Every day | ORAL | 3 refills | Status: DC
  Filled 2024-02-18: qty 30, 30d supply, fill #0
  Filled 2024-02-25 – 2024-03-14 (×2): qty 30, 30d supply, fill #1
  Filled 2024-04-06 – 2024-04-07 (×2): qty 30, 30d supply, fill #2
  Filled 2024-04-25 – 2024-05-03 (×3): qty 30, 30d supply, fill #3
  Filled 2024-06-16: qty 30, 30d supply, fill #4
  Filled 2024-07-19: qty 30, 30d supply, fill #5

## 2024-02-18 MED ORDER — METOCLOPRAMIDE HCL 5 MG PO TABS
5.0000 mg | ORAL_TABLET | Freq: Three times a day (TID) | ORAL | 1 refills | Status: DC
  Filled 2024-02-18: qty 90, 30d supply, fill #0
  Filled 2024-02-25 – 2024-03-14 (×2): qty 90, 30d supply, fill #1

## 2024-02-18 MED ORDER — DULOXETINE HCL 60 MG PO CPEP
60.0000 mg | ORAL_CAPSULE | Freq: Every day | ORAL | 3 refills | Status: AC
  Filled 2024-02-18: qty 30, 30d supply, fill #0
  Filled 2024-02-25 – 2024-03-14 (×2): qty 30, 30d supply, fill #1
  Filled 2024-04-06 – 2024-04-07 (×2): qty 30, 30d supply, fill #2
  Filled 2024-04-25 – 2024-05-03 (×3): qty 30, 30d supply, fill #3
  Filled 2024-06-16: qty 30, 30d supply, fill #4
  Filled 2024-07-19: qty 30, 30d supply, fill #5
  Filled 2024-08-18: qty 30, 30d supply, fill #6
  Filled 2024-09-20 (×2): qty 30, 30d supply, fill #7
  Filled 2024-10-18: qty 30, 30d supply, fill #8
  Filled 2024-11-09 – 2024-11-10 (×2): qty 30, 30d supply, fill #9
  Filled 2024-12-22: qty 30, 30d supply, fill #10
  Filled 2025-01-21: qty 30, 30d supply, fill #11

## 2024-02-18 NOTE — Assessment & Plan Note (Signed)
 RRR today.  No reported symptoms of palpitations.  Continue to monitor.
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
## 2024-02-18 NOTE — Patient Instructions (Addendum)
 Indie,  I'm glad you were able to come in today! It was good to catch up.  Next visit will be dedicated to removing some skin tags.  We'll need a longer appt.  We'll also review blood sugars after you have a chance to use your monitor again.  We need to get you back under control.  We got some refills done and I've made some changes to simplify your medications.  Your dry mouth may get better with stopping the oxybutynin.  I'd like you to start taking the muscle relaxant AS NEEDED rather than scheduled; use only if needed for very painful muscle spasms.  Keep in touch and I'll see you very soon.  Dr. Mayford Knife

## 2024-02-18 NOTE — Assessment & Plan Note (Signed)
 This has generally improved and episodes are less frequent.  She feels the metoclopramide was effective though has run out and desires a RF.  Again the GLP1 did not seem to contribute to her sxs.  She is at only metformin 500 mg daily to try to minimize side effects.

## 2024-02-18 NOTE — Assessment & Plan Note (Signed)
 Took meds this morning: 142/70 on  amlodipine 10 mg daily (probably contributing to LE edema though few options available), metoprolol 100 mg daily, and spironolactone 25 mg 2 pills daily (increased from 25 mg daily at last visit).  Very close to goal.  I will change her spironolactone to 50 mg 1 pill daily.  BMP completed today to assess electrolytes and renal function.

## 2024-02-18 NOTE — Assessment & Plan Note (Signed)
 We agreed to stop the oxybutynin which may help with her severe dry mouth.  She inquires again about home PureWick device. I'm not sure how to investigate insurance coverage for this.

## 2024-02-18 NOTE — Assessment & Plan Note (Signed)
 She has healed and is now wearing her new diabetic sneakers with toeblock in R shoe, which work well.

## 2024-02-18 NOTE — Assessment & Plan Note (Signed)
 Unchanged.  No venous stasis wounds or weeping.  Very difficult for her to apply her own compression stocking due to her hand neuropathy.  Spends day in the chair which does not always allow for elevation.  Intolerant of diuretics and high-dose amlodipine does not help much either.  Difficult situation.

## 2024-02-18 NOTE — Assessment & Plan Note (Signed)
 Continues to smoke > 1 ppd. Overdue for lung cancer screening.  Stress and home environment contribute to ongoing habit. She has the desire but no support or wherewithal to quit.

## 2024-02-18 NOTE — Assessment & Plan Note (Signed)
 She remains uncontrolled with A1c greater than 9, though she has been well-controlled in the past year.  She is currently taking metformin 500 mg daily as higher doses were questionably tolerated (difficult to suss out as she has also has probable gastroparesis).  Taking 65 units of Tresiba and 1 mg weekly Ozempic.  She has not been checking her blood sugars and has not attached her new CGM device.  We do not know what her fasting sugars have been.  She agrees to begin recording and we will adjust treatment at an expedited follow-up.  Hesitate to increase GLP-1 given her GI symptoms.  Hesitate to initiate SGLT2i due to her chronic perineal candidiasis.

## 2024-02-19 LAB — BASIC METABOLIC PANEL
BUN/Creatinine Ratio: 14 (ref 12–28)
BUN: 10 mg/dL (ref 8–27)
CO2: 22 mmol/L (ref 20–29)
Calcium: 8.8 mg/dL (ref 8.7–10.3)
Chloride: 104 mmol/L (ref 96–106)
Creatinine, Ser: 0.72 mg/dL (ref 0.57–1.00)
Glucose: 201 mg/dL — ABNORMAL HIGH (ref 70–99)
Potassium: 3.8 mmol/L (ref 3.5–5.2)
Sodium: 141 mmol/L (ref 134–144)
eGFR: 93 mL/min/{1.73_m2} (ref >59)

## 2024-02-22 ENCOUNTER — Other Ambulatory Visit: Payer: Self-pay

## 2024-02-22 ENCOUNTER — Other Ambulatory Visit (HOSPITAL_COMMUNITY): Payer: Self-pay

## 2024-02-23 ENCOUNTER — Other Ambulatory Visit: Payer: Self-pay

## 2024-02-25 ENCOUNTER — Other Ambulatory Visit: Payer: Self-pay

## 2024-03-01 ENCOUNTER — Telehealth: Payer: Self-pay | Admitting: *Deleted

## 2024-03-01 NOTE — Telephone Encounter (Signed)
 Patient was identified as falling into the [place] Measure - Diabetes.   Patient was: Appointment scheduled with primary care provider in the next 30 days.

## 2024-03-04 ENCOUNTER — Other Ambulatory Visit: Payer: Self-pay | Admitting: Internal Medicine

## 2024-03-04 DIAGNOSIS — G8929 Other chronic pain: Secondary | ICD-10-CM

## 2024-03-04 NOTE — Telephone Encounter (Signed)
 Copied from CRM [id number]. Topic: Clinical - Medication Refill >> Mar 04, 2024  8:44 AM Irine Seal wrote: Most Recent Primary Care Visit:  Provider: Miguel Aschoff  Department: [facility]  Visit Type: OPEN ESTABLISHED  Date: 02/18/2024  Medication: oxyCODONE (OXY IR/ROXICODONE) 5 MG immediate release tablet  Has the patient contacted their pharmacy? No (Agent: If no, request that the patient contact the pharmacy for the refill. If patient does not wish to contact the pharmacy document the reason why and proceed with request.) (Agent: If yes, when and what did the pharmacy advise?)  Is this the correct pharmacy for this prescription? Yes If no, delete pharmacy and type the correct one.  This is the patient's preferred pharmacy:  [facility] [address] [place] [address] Phone: [phone] Fax: [phone]   Has the prescription been filled recently? no  Is the patient out of the medication? Yes  Has the patient been seen for an appointment in the last year OR does the patient have an upcoming appointment? Yes  Can we respond through MyChart? Yes  Agent: Please be advised that Rx refills may take up to 3 business days. We ask that you follow-up with your pharmacy.

## 2024-03-07 ENCOUNTER — Other Ambulatory Visit: Payer: Self-pay | Admitting: Internal Medicine

## 2024-03-07 DIAGNOSIS — E1142 Type 2 diabetes mellitus with diabetic polyneuropathy: Secondary | ICD-10-CM

## 2024-03-07 NOTE — Telephone Encounter (Signed)
 Copied from CRM [id number]. Topic: Clinical - Medication Refill >> Mar 07, 2024  2:26 PM Brittney F wrote: Most Recent Primary Care Visit:  Provider: Miguel Aschoff  Department: [facility]  Visit Type: OPEN ESTABLISHED  Date: 02/18/2024   Medication: pregabalin (LYRICA) 200 MG capsule  Has the patient contacted their pharmacy? Yes (Agent: If no, request that the patient contact the pharmacy for the refill. If patient does not wish to contact the pharmacy document the reason why and proceed with request.) (Agent: If yes, when and what did the pharmacy advise?)  Is this the correct pharmacy for this prescription? Yes  [place] [facility] [address] [place] [address] Phone: [phone] Fax: [phone]   Has the prescription been filled recently? No  Is the patient out of the medication? Yes  Has the patient been seen for an appointment in the last year OR does the patient have an upcoming appointment? Yes  Can we respond through MyChart? No, Patient would like a call back when prescription has been filled. Phone: [phone]  Agent: Please be advised that Rx refills may take up to 3 business days. We ask that you follow-up with your pharmacy.

## 2024-03-07 NOTE — Telephone Encounter (Signed)
 Last Fill: 11/04/23  Last OV: 02/18/24 Next OV: 03/15/24  Routing to provider for review/authorization.

## 2024-03-08 ENCOUNTER — Other Ambulatory Visit: Payer: Self-pay

## 2024-03-09 ENCOUNTER — Other Ambulatory Visit (HOSPITAL_COMMUNITY): Payer: Self-pay

## 2024-03-09 ENCOUNTER — Other Ambulatory Visit: Payer: Self-pay | Admitting: Internal Medicine

## 2024-03-09 DIAGNOSIS — G8929 Other chronic pain: Secondary | ICD-10-CM

## 2024-03-10 MED ORDER — OXYCODONE HCL 5 MG PO TABS
5.0000 mg | ORAL_TABLET | ORAL | 0 refills | Status: DC | PRN
  Filled 2024-03-10: qty 120, 20d supply, fill #0

## 2024-03-10 MED ORDER — PREGABALIN 200 MG PO CAPS
200.0000 mg | ORAL_CAPSULE | Freq: Three times a day (TID) | ORAL | 1 refills | Status: DC
  Filled 2024-03-10: qty 270, 90d supply, fill #0

## 2024-03-11 ENCOUNTER — Ambulatory Visit: Payer: 59 | Admitting: Podiatry

## 2024-03-11 ENCOUNTER — Other Ambulatory Visit: Payer: Self-pay

## 2024-03-11 ENCOUNTER — Other Ambulatory Visit (HOSPITAL_COMMUNITY): Payer: Self-pay

## 2024-03-14 ENCOUNTER — Other Ambulatory Visit: Payer: Self-pay

## 2024-03-14 NOTE — Telephone Encounter (Signed)
 Copied from CRM [id number]. Topic: Clinical - Medication Question >> Mar 09, 2024 12:10 PM Carrielelia G wrote: Reason for CRM: Patient calling again regarding her medication patient just call five minutes earlier. Informed her that her medication is pending. >> Mar 11, 2024  1:50 PM Suzette B wrote: Pt called to check status of her refill on the hydrocodone, advised patient the the medication had been sent on yesterday to her preferred pharmacy

## 2024-03-15 ENCOUNTER — Emergency Department (HOSPITAL_COMMUNITY)
Admission: EM | Admit: 2024-03-15 | Discharge: 2024-03-15 | Disposition: A | Discharge status: Home / Self Care | Attending: Emergency Medicine | Admitting: Emergency Medicine

## 2024-03-15 ENCOUNTER — Other Ambulatory Visit: Payer: Self-pay

## 2024-03-15 ENCOUNTER — Telehealth: Payer: Self-pay | Admitting: *Deleted

## 2024-03-15 ENCOUNTER — Emergency Department (HOSPITAL_COMMUNITY)

## 2024-03-15 ENCOUNTER — Ambulatory Visit: Admitting: Internal Medicine

## 2024-03-15 VITALS — BP 175/81 | HR 91 | Temp 98.4°F | Ht 65.0 in | Wt 220.5 lb

## 2024-03-15 DIAGNOSIS — R1012 Left upper quadrant pain: Secondary | ICD-10-CM | POA: Diagnosis not present

## 2024-03-15 DIAGNOSIS — E1165 Type 2 diabetes mellitus with hyperglycemia: Secondary | ICD-10-CM | POA: Diagnosis not present

## 2024-03-15 DIAGNOSIS — I5030 Unspecified diastolic (congestive) heart failure: Secondary | ICD-10-CM | POA: Diagnosis not present

## 2024-03-15 DIAGNOSIS — Z720 Tobacco use: Secondary | ICD-10-CM | POA: Diagnosis not present

## 2024-03-15 DIAGNOSIS — R109 Unspecified abdominal pain: Secondary | ICD-10-CM | POA: Diagnosis not present

## 2024-03-15 DIAGNOSIS — Z7984 Long term (current) use of oral hypoglycemic drugs: Secondary | ICD-10-CM | POA: Insufficient documentation

## 2024-03-15 DIAGNOSIS — J449 Chronic obstructive pulmonary disease, unspecified: Secondary | ICD-10-CM | POA: Insufficient documentation

## 2024-03-15 DIAGNOSIS — E1122 Type 2 diabetes mellitus with diabetic chronic kidney disease: Secondary | ICD-10-CM | POA: Insufficient documentation

## 2024-03-15 DIAGNOSIS — R079 Chest pain, unspecified: Secondary | ICD-10-CM | POA: Diagnosis not present

## 2024-03-15 DIAGNOSIS — Z79899 Other long term (current) drug therapy: Secondary | ICD-10-CM | POA: Insufficient documentation

## 2024-03-15 DIAGNOSIS — Z7982 Long term (current) use of aspirin: Secondary | ICD-10-CM | POA: Diagnosis not present

## 2024-03-15 DIAGNOSIS — I130 Hypertensive heart and chronic kidney disease with heart failure and stage 1 through stage 4 chronic kidney disease, or unspecified chronic kidney disease: Secondary | ICD-10-CM | POA: Diagnosis not present

## 2024-03-15 DIAGNOSIS — R0989 Other specified symptoms and signs involving the circulatory and respiratory systems: Secondary | ICD-10-CM | POA: Diagnosis not present

## 2024-03-15 DIAGNOSIS — R600 Localized edema: Secondary | ICD-10-CM | POA: Insufficient documentation

## 2024-03-15 DIAGNOSIS — N182 Chronic kidney disease, stage 2 (mild): Secondary | ICD-10-CM | POA: Insufficient documentation

## 2024-03-15 DIAGNOSIS — I10 Essential (primary) hypertension: Secondary | ICD-10-CM | POA: Diagnosis not present

## 2024-03-15 LAB — COMPREHENSIVE METABOLIC PANEL
ALT: 14 U/L (ref 0–44)
AST: 15 U/L (ref 15–41)
Albumin: 2.9 g/dL — ABNORMAL LOW (ref 3.5–5.0)
Alkaline Phosphatase: 75 U/L (ref 38–126)
Anion gap: 7 (ref 5–15)
BUN: 8 mg/dL (ref 8–23)
CO2: 29 mmol/L (ref 22–32)
Calcium: 9 mg/dL (ref 8.9–10.3)
Chloride: 102 mmol/L (ref 98–111)
Creatinine, Ser: 0.87 mg/dL (ref 0.44–1.00)
GFR, Estimated: >60 mL/min (ref >60)
Glucose, Bld: 311 mg/dL — ABNORMAL HIGH (ref 70–99)
Potassium: 4.3 mmol/L (ref 3.5–5.1)
Sodium: 138 mmol/L (ref 135–145)
Total Bilirubin: 0.7 mg/dL (ref 0.0–1.2)
Total Protein: 6.6 g/dL (ref 6.5–8.1)

## 2024-03-15 LAB — CBC WITH DIFFERENTIAL/PLATELET
Abs Immature Granulocytes: 0.01 10*3/uL (ref 0.00–0.07)
Basophils Absolute: 0 10*3/uL (ref 0.0–0.1)
Basophils Relative: 1 %
Eosinophils Absolute: 0 10*3/uL (ref 0.0–0.5)
Eosinophils Relative: 1 %
HCT: 39.2 % (ref 36.0–46.0)
Hemoglobin: 12.7 g/dL (ref 12.0–15.0)
Immature Granulocytes: 0 %
Lymphocytes Relative: 24 %
Lymphs Abs: 1.8 10*3/uL (ref 0.7–4.0)
MCH: 30.8 pg (ref 26.0–34.0)
MCHC: 32.4 g/dL (ref 30.0–36.0)
MCV: 94.9 fL (ref 80.0–100.0)
Monocytes Absolute: 0.4 10*3/uL (ref 0.1–1.0)
Monocytes Relative: 6 %
Neutro Abs: 5.2 10*3/uL (ref 1.7–7.7)
Neutrophils Relative %: 68 %
Platelets: 142 10*3/uL — ABNORMAL LOW (ref 150–400)
RBC: 4.13 MIL/uL (ref 3.87–5.11)
RDW: 13.9 % (ref 11.5–15.5)
WBC: 7.5 10*3/uL (ref 4.0–10.5)
nRBC: 0 % (ref 0.0–0.2)

## 2024-03-15 LAB — URINALYSIS, W/ REFLEX TO CULTURE (INFECTION SUSPECTED)
Bilirubin Urine: NEGATIVE
Glucose, UA: >=500 mg/dL — AB
Hgb urine dipstick: NEGATIVE
Ketones, ur: NEGATIVE mg/dL
Leukocytes,Ua: NEGATIVE
Nitrite: NEGATIVE
Protein, ur: NEGATIVE mg/dL
Specific Gravity, Urine: 1.02 (ref 1.005–1.030)
pH: 6 (ref 5.0–8.0)

## 2024-03-15 LAB — LIPASE, BLOOD: Lipase: 33 U/L (ref 11–51)

## 2024-03-15 LAB — TROPONIN I (HIGH SENSITIVITY)
Troponin I (High Sensitivity): 8 ng/L (ref <18)
Troponin I (High Sensitivity): 8 ng/L (ref <18)

## 2024-03-15 MED ORDER — METOCLOPRAMIDE HCL 5 MG/ML IJ SOLN
10.0000 mg | Freq: Once | INTRAMUSCULAR | Status: AC
  Administered 2024-03-15: 10 mg via INTRAVENOUS
  Filled 2024-03-15: qty 2

## 2024-03-15 MED ORDER — METHOCARBAMOL 500 MG PO TABS
500.0000 mg | ORAL_TABLET | Freq: Two times a day (BID) | ORAL | 0 refills | Status: DC
  Filled 2024-03-15 – 2024-03-16 (×2): qty 20, 10d supply, fill #0

## 2024-03-15 MED ORDER — ONDANSETRON HCL 4 MG/2ML IJ SOLN: 4.0000 mg | Freq: Once | INTRAMUSCULAR | Status: DC

## 2024-03-15 MED ORDER — HYDROMORPHONE HCL 1 MG/ML IJ SOLN
1.0000 mg | Freq: Once | INTRAMUSCULAR | Status: AC
  Administered 2024-03-15: 1 mg via INTRAVENOUS
  Filled 2024-03-15: qty 1

## 2024-03-15 MED ORDER — LACTATED RINGERS IV BOLUS
1000.0000 mL | Freq: Once | INTRAVENOUS | Status: AC
  Administered 2024-03-15: 1000 mL via INTRAVENOUS

## 2024-03-15 MED ORDER — LIDOCAINE 5 % EX PTCH
1.0000 | MEDICATED_PATCH | CUTANEOUS | Status: DC
  Administered 2024-03-15: 1 via TRANSDERMAL
  Filled 2024-03-15: qty 1

## 2024-03-15 MED ORDER — LIDOCAINE 5 % EX PTCH
1.0000 | MEDICATED_PATCH | CUTANEOUS | 0 refills | Status: AC
  Filled 2024-03-15: qty 30, 30d supply, fill #0

## 2024-03-15 MED ORDER — METHOCARBAMOL 500 MG PO TABS
500.0000 mg | ORAL_TABLET | Freq: Once | ORAL | Status: AC
Start: 2024-03-15 — End: 2024-03-15
  Administered 2024-03-15: 500 mg via ORAL
  Filled 2024-03-15: qty 1

## 2024-03-15 NOTE — ED Provider Notes (Signed)
 [place] [facility]GENC[facility] DEPARTMENT AT [facility] Provider Note   CSN: [id number] Arrival date & time: 03/15/24  0930     History  Chief Complaint  Patient presents with   Abdominal Pain   Emesis   Diarrhea    Jennifer [name] is a 63 y.o. female.  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  Patient reports that the pain is now there all the time but she will have intermittent sharp bouts of pain that is unbearable.  She has had persistent nausea and occasional vomiting over the last 2 weeks but not persistent.  She denies any urinary symptoms other than her persistent incontinence.  She has not had a fever.  She does report a cough with some mucus present but denies significant shortness of breath.  She waxes and wanes on whether taking a deep breath makes the pain worse.  She does not feel like eating affects the pain.  She has had loose stools but reports that that is an ongoing problem for years.  She has been compliant with her medications and denies having pain like this in the past.  She denies any recent traumas and reports no specific movements make her pain worse.  She takes oxycodone chronically for pain but reports that is not helping this pain.  She has not noticed any rashes.  The history is provided by the patient and medical records.  Abdominal Pain Associated symptoms: diarrhea and vomiting   Emesis Associated symptoms: abdominal pain and diarrhea   Diarrhea Associated symptoms: abdominal pain and vomiting        Home Medications Prior to Admission  medications   Medication Sig Start Date End Date Taking? Authorizing Provider  Accu-Chek Softclix Lancets lancets Use as back up to CGM sensors up to 1 time a day 11/17/23   Miguel Aschoff, MD  acetaminophen (TYLENOL) 650 MG CR tablet Take 2 tablets (1,300 mg total) by mouth every 8 (eight) hours as needed for pain. 02/16/23   Mapp, Gaylyn Cheers, MD  albuterol (PROAIR HFA) 108 (90 Base) MCG/ACT inhaler INHALE 2 PUFFS BY MOUTH EVERY 6 HOURS AS NEEDED FOR WHEEZING 11/04/23   Miguel Aschoff, MD  amLODipine (NORVASC) 10 MG tablet Take 1 tablet (10 mg total) by mouth daily. 08/06/23   Miguel Aschoff, MD  aspirin EC 81 MG tablet Take 1 tablet (81 mg total) by mouth daily. 09/19/21   Miguel Aschoff, MD  Baclofen 5 MG TABS Take 1 tablet (5 mg total) by mouth 2 (two) times daily as needed (muscle spasms). 02/18/24   Miguel Aschoff, MD  Blood Glucose Monitoring Suppl (ACCU-CHEK GUIDE) w/Device KIT Use to check blood sugar up to 1 times a day 11/17/23   Miguel Aschoff, MD  Continuous Glucose Sensor (DEXCOM G7 SENSOR) MISC Place new sensor every 10 days. Use to monitor blood sugar continuously. 11/17/23   Miguel Aschoff, MD  DULoxetine (CYMBALTA) 60 MG capsule Take 1 capsule (60 mg total) by mouth daily. 02/18/24   Miguel Aschoff, MD  glucose blood (ACCU-CHEK GUIDE TEST) test strip Use  as back up to CGM sensors up to 1 time a day 11/17/23   Miguel Aschoff, MD  insulin degludec (TRESIBA FLEXTOUCH) 200 UNIT/ML FlexTouch Pen Inject 65 Units into the skin daily. 02/18/24   Miguel Aschoff, MD  Insulin Pen Needle 32G X 4 MM MISC Use to inject insulin 4 (four) times daily. 02/18/24   Miguel Aschoff, MD  loperamide (IMODIUM A-D) 2 MG capsule Take 1 capsule (2 mg total) by mouth as needed for diarrhea or loose stools. 08/06/23   Miguel Aschoff, MD  metFORMIN (GLUCOPHAGE-XR) 500 MG 24 hr tablet Take 1 tablet (500 mg total) by mouth daily with breakfast. 08/06/23    Miguel Aschoff, MD  metoCLOPramide (REGLAN) 5 MG tablet Take 1 tablet (5 mg total) by mouth 3 (three) times daily before meals. 02/18/24   Miguel Aschoff, MD  metoprolol succinate (TOPROL-XL) 100 MG 24 hr tablet Take 1 tablet (100 mg total) by mouth daily. TAKE WITH OR IMMEDIATELY FOLLOWING A MEAL. 02/18/24   Miguel Aschoff, MD  MYRBETRIQ 50 MG TB24 tablet Take 1 tablet (50 mg total) by mouth daily. 02/18/24   Miguel Aschoff, MD  nystatin cream (MYCOSTATIN) Apply 1 Application topically 2 (two) times daily. 10/20/23   Miguel Aschoff, MD  nystatin powder Apply 1 Application topically 3 (three) times daily. 10/20/23   Miguel Aschoff, MD  ondansetron (ZOFRAN-ODT) 4 MG disintegrating tablet Take 1 tablet (4 mg total) by mouth every 8 (eight) hours as needed for nausea or vomiting. 02/18/24   Miguel Aschoff, MD  oxyCODONE (OXY IR/ROXICODONE) 5 MG immediate release tablet Take 1 tablet (5 mg total) by mouth every 4 (four) hours as needed for severe pain (pain score 7-10). 03/10/24   Miguel Aschoff, MD  pantoprazole (PROTONIX) 40 MG tablet Take 1 tablet (40 mg total) by mouth daily. 07/14/23   Miguel Aschoff, MD  pregabalin (LYRICA) 200 MG capsule Take 1 capsule (200 mg total) by mouth 3 (three) times daily. 03/10/24   Miguel Aschoff, MD  rosuvastatin (CRESTOR) 20 MG tablet Take 1 tablet (20 mg total) by mouth in the morning. 02/18/24   Miguel Aschoff, MD  Semaglutide, 1 MG/DOSE, (OZEMPIC, 1 MG/DOSE,) 4 MG/3ML SOPN Inject 1 mg into the skin once a week. 08/06/23   Miguel Aschoff, MD  spironolactone (ALDACTONE) 50 MG tablet Take 1 tablet (50 mg total) by mouth daily. 02/18/24   Miguel Aschoff, MD  umeclidinium-vilanterol [facility] ELLIPTA) 62.5-25 MCG/ACT AEPB Inhale 1 puff into the lungs in the morning. 08/06/23   Miguel Aschoff, MD  Potassium Chloride ER 20 MEQ TBCR Take 40 mEq by mouth daily. 01/02/14 01/23/14  Belia Heman, MD       Allergies    Benazepril, Chantix [varenicline], Ioversol, Morphine sulfate, Pineapple, Abilify [aripiprazole], Iohexol, and Ivp dye [iodinated contrast media]    Review of Systems   Review of Systems  Gastrointestinal:  Positive for abdominal pain, diarrhea and vomiting.    Physical Exam Updated Vital Signs BP (!) 150/58   Pulse 74   Temp 98.1 F (36.7 C) (Oral)   Resp 15   SpO2 92%  Physical Exam Vitals and nursing note reviewed.  Constitutional:      General: She is not in acute distress.    Appearance: She is well-developed.     Comments: Appears uncomfortable  HENT:     Head: Normocephalic and atraumatic.     Mouth/Throat:  Mouth: Mucous membranes are moist.  Eyes:     Pupils: Pupils are equal, round, and reactive to light.  Cardiovascular:     Rate and Rhythm: Normal rate and regular rhythm.     Pulses: Normal pulses.     Heart sounds: Normal heart sounds. No murmur heard.    No friction rub.  Pulmonary:     Effort: Pulmonary effort is normal.     Breath sounds: Normal breath sounds. No wheezing or rales.    Chest:     Chest wall: No tenderness.  Abdominal:     General: Bowel sounds are normal. There is no distension.     Palpations: Abdomen is soft.     Tenderness: There is abdominal tenderness in the left upper quadrant. There is left CVA tenderness. There is no guarding or rebound.    Musculoskeletal:        General: No tenderness. Normal range of motion.     Cervical back: Normal range of motion.     Comments: .  Left BKA, right leg with some healing wounds, 2-3+ pitting edema to the midshin  Skin:    General: Skin is warm and dry.     Findings: No rash.  Neurological:     Mental Status: She is alert and oriented to person, place, and time. Mental status is at baseline.     Cranial Nerves: No cranial nerve deficit.  Psychiatric:        Behavior: Behavior normal.     ED Results / Procedures / Treatments   Labs (all labs ordered are listed,  but only abnormal results are displayed) Labs Reviewed  CBC WITH DIFFERENTIAL/PLATELET - Abnormal; Notable for the following components:      Result Value   Platelets 142 (*)    All other components within normal limits  COMPREHENSIVE METABOLIC PANEL - Abnormal; Notable for the following components:   Glucose, Bld 311 (*)    Albumin 2.9 (*)    All other components within normal limits  URINALYSIS, W/ REFLEX TO CULTURE (INFECTION SUSPECTED) - Abnormal; Notable for the following components:   Glucose, UA >=500 (*)    Bacteria, UA RARE (*)    All other components within normal limits  LIPASE, BLOOD  TROPONIN I (HIGH SENSITIVITY)  TROPONIN I (HIGH SENSITIVITY)    EKG EKG Interpretation Date/Time:  Tuesday March 15 2024 10:12:51 EDT Ventricular Rate:  78 PR Interval:  146 QRS Duration:  94 QT Interval:  399 QTC Calculation: 455 R Axis:   -9  Text Interpretation: Sinus rhythm Low voltage, precordial leads No significant change since last tracing Confirmed by Gwyneth Sprout ([id number]) on 03/15/2024 11:15:40 AM  Radiology CT ABDOMEN PELVIS WO CONTRAST Result Date: 03/15/2024 CLINICAL DATA:  Left-sided abdominal pain. EXAM: CT ABDOMEN AND PELVIS WITHOUT CONTRAST TECHNIQUE: Multidetector CT imaging of the abdomen and pelvis was performed following the standard protocol without IV contrast. RADIATION DOSE REDUCTION: This exam was performed according to the departmental dose-optimization program which includes automated exposure control, adjustment of the mA and/or kV according to patient size and/or use of iterative reconstruction technique. COMPARISON:  October 31, 2023. FINDINGS: Lower chest: No acute abnormality. Hepatobiliary: No focal liver abnormality is seen. No gallstones, gallbladder wall thickening, or biliary dilatation. Pancreas: Unremarkable. No pancreatic ductal dilatation or surrounding inflammatory changes. Spleen: Normal in size without focal abnormality. Adrenals/Urinary Tract:  Adrenal glands appear normal. Stable probable large cyst seen in inferior pole of right kidney. Stable hyperdense abnormality seen lateral to  this which may represent hyperdense cyst, but renal ultrasound is recommended for further evaluation. No renal or ureteral calculi are noted. No hydronephrosis or renal obstruction is noted. Urinary bladder is unremarkable. Stomach/Bowel: Stomach is within normal limits. Appendix appears normal. No evidence of bowel wall thickening, distention, or inflammatory changes. Vascular/Lymphatic: Aortic atherosclerosis. No enlarged abdominal or pelvic lymph nodes. Reproductive: Status post hysterectomy. No adnexal masses. Other: No ascites or hernia is noted. Musculoskeletal: No acute or significant osseous findings. IMPRESSION: Stable probable large cyst seen in inferior pole of right kidney. Stable hyperdense rounded abnormality seen lateral to this in right kidney which may represent hyperdense cyst, but renal ultrasound is recommended for confirmation and to rule out neoplasm. No acute abnormality seen in the abdomen or pelvis. Aortic Atherosclerosis (ICD10-I70.0). Electronically Signed   By: Lupita Raider M.D.   On: 03/15/2024 15:23   DG Chest Port 1 View Result Date: 03/15/2024 CLINICAL DATA:  Left-sided chest/abdominal pain. EXAM: PORTABLE CHEST 1 VIEW COMPARISON:  11/07/2021. FINDINGS: Low lung volume. Bilateral lung fields are clear. Bilateral costophrenic angles are clear. Stable cardio-mediastinal silhouette. No acute osseous abnormalities. The soft tissues are within normal limits. IMPRESSION: No active disease. Electronically Signed   By: Jules Schick M.D.   On: 03/15/2024 13:05    Procedures Procedures    Medications Ordered in ED Medications  lidocaine (LIDODERM) 5 % 1 patch (has no administration in time range)  methocarbamol (ROBAXIN) tablet 500 mg (has no administration in time range)  HYDROmorphone (DILAUDID) injection 1 mg (1 mg Intravenous Given  03/15/24 1007)  lactated ringers bolus 1,000 mL (0 mLs Intravenous Stopped 03/15/24 1112)  metoCLOPramide (REGLAN) injection 10 mg (10 mg Intravenous Given 03/15/24 1006)    ED Course/ Medical Decision Making/ A&P                                 Medical Decision Making Amount and/or Complexity of Data Reviewed Labs: ordered. Radiology: ordered.  Risk Prescription drug management.   Pt with multiple medical problems and comorbidities and presenting today with a complaint that caries a high risk for morbidity and mortality.  Here today with the above complaints with concern for possible exacerbation of her gastroparesis, gastritis, pancreatitis, pneumonia, pyelonephritis, renal stone versus musculoskeletal pathology.  Lower suspicion for diverticulitis, perforation, appendicitis, hepatitis or gallbladder pathology.  Lower suspicion for PE.  And no findings to suggest zoster. Patient given pain control.  3:46 PM I independently interpreted patient's EKG and labs.  UA without obvious signs of infection, troponin, lipase, CBC, CMP all without acute findings except for hyperglycemia with a blood sugar of 300.  I have independently visualized and interpreted pt's images today.  This x-ray within normal limits.  CT to further evaluate pending.  I have independently visualized and interpreted pt's images today.  cT without hydronephrosis or mass.  Radiology reports stable probable large cyst in the inferior pole of the right kidney with stable hyperdense round abnormality seen lateral to the kidney but could have an ultrasound for confirmation but no acute findings explain patient's pain.  With reassuring labs and reassuring CT concerned that patient's pain may be musculoskeletal in nature.  Discussed these findings with the patient.  She has plenty of Reglan at home for her gastroparesis and has had no vomiting here.  She has been ambulating to and from bathroom without issues.  Will add Lidoderm patch  and Robaxin.  Will  have patient follow-up with her PCP.         Final Clinical Impression(s) / ED Diagnoses Final diagnoses:  Left sided abdominal pain    Rx / DC Orders ED Discharge Orders     None         Gwyneth Sprout, MD 03/15/24 1546

## 2024-03-15 NOTE — ED Notes (Signed)
 This paramedic walked in pt room and she was eating. Pt advised to not eat until testing is finished.

## 2024-03-15 NOTE — ED Notes (Signed)
Pt ambulated to the restroom without assistance
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
## 2024-03-15 NOTE — ED Triage Notes (Signed)
 Pt c.o RUQ pain, n/v/d for 2 weeks. Pt sent here from IM clinic downstairs.

## 2024-03-15 NOTE — ED Notes (Signed)
 Pt ambulated to restroom without assistance.

## 2024-03-15 NOTE — ED Notes (Signed)
 Patient transported to CT

## 2024-03-15 NOTE — Telephone Encounter (Signed)
 Call from patient stated has been having pain on left side to stomach x 2 weeks.  Is getting worse.  Gets sharp sometimes to a level 10.  Is passing gas. Some nausea,but, no fevers.  Is having bowel movements. Just wanted doctor to know for visit  on 03/15/2024.

## 2024-03-15 NOTE — Assessment & Plan Note (Signed)
 Differential includes colitis, splenic pathology, renal pathology.  Location seems too proximal for diverticulitis (she has had this in the past and the pain is not similar).  She is suspected of having gastroparesis though this presentation is not consistent with that diagnosis.  Will need labs and abdominal CT to clarify diagnosis.  She is currently stable but in significant distress and given her multiple chronic conditions will need close follow-up.  My sweet lady DNA was transferred to the emergency room waiting area for triage.

## 2024-03-15 NOTE — Discharge Instructions (Signed)
 Blood work and xrays today look good.  No signs of urine infection, kidney infection or heart issues.  You do not have pneumonia and think the pain your are experiencing may be related to a muscle strain.  Continue your current meds and will add on a muscle relaxer.  You will need to follow up with your doctor if pain does not improve.  If you develop fever, persistent vomiting, shortness of breath or other concerns return to the ER.

## 2024-03-16 ENCOUNTER — Telehealth: Payer: Self-pay

## 2024-03-16 ENCOUNTER — Other Ambulatory Visit: Payer: Self-pay

## 2024-03-16 NOTE — Transitions of Care (Post Inpatient/ED Visit) (Unsigned)
   03/16/2024  Name: Jennifer [name] MRN: [id number] DOB: March 04, 1961  Today's TOC FU Call Status: Today's TOC FU Call Status:: Unsuccessful Call (1st Attempt) Unsuccessful Call (1st Attempt) Date: 03/16/24  Attempted to reach the patient regarding the most recent Inpatient/ED visit.  Follow Up Plan: Additional outreach attempts will be made to reach the patient to complete the Transitions of Care (Post Inpatient/ED visit) call.   Signature Karena Addison, LPN [facility] Nurse Health Advisor Direct Dial [phone]

## 2024-03-17 ENCOUNTER — Ambulatory Visit: Payer: Self-pay | Admitting: Internal Medicine

## 2024-03-17 ENCOUNTER — Other Ambulatory Visit (HOSPITAL_COMMUNITY): Payer: Self-pay

## 2024-03-17 NOTE — Transitions of Care (Post Inpatient/ED Visit) (Signed)
 03/17/2024  Name: Jennifer [name] MRN: [id number] DOB: Sep 30, 1961  Today's TOC FU Call Status: Today's TOC FU Call Status:: Successful TOC FU Call Completed Unsuccessful Call (1st Attempt) Date: 03/16/24 [facility] FU Call Complete Date: 03/17/24 Patient's Name and Date of Birth confirmed.  Transition Care Management Follow-up Telephone Call Date of Discharge: 03/15/24 Discharge Facility: [facility] Type of Discharge: Emergency Department Reason for ED Visit: Other: How have you been since you were released from the hospital?: Same Any questions or concerns?: No  Items Reviewed: Did you receive and understand the discharge instructions provided?: Yes Medications obtained,verified, and reconciled?: Yes (Medications Reviewed) Any new allergies since your discharge?: No Dietary orders reviewed?: Yes Do you have support at home?: Yes People in Home: child(ren), adult  Medications Reviewed Today: Medications Reviewed Today     Reviewed by Karena Addison, LPN (Licensed Practical Nurse) on 03/17/24 at 1136  Med List Status: <None>   Medication Order Taking? Sig Documenting Provider Last Dose Status Informant  Accu-Chek Softclix Lancets lancets [id number] No Use as back up to CGM sensors up to 1 time a day Miguel Aschoff, MD Taking Active   acetaminophen (TYLENOL) 650 MG CR tablet [id number] No Take 2 tablets (1,300 mg total) by mouth every 8 (eight) hours as needed for pain. Mapp, Gaylyn Cheers, MD Taking Active   albuterol [facility] HFA) [phone] Base) MCG/ACT inhaler [id number] No INHALE 2 PUFFS BY MOUTH EVERY 6 HOURS AS NEEDED FOR WHEEZING Miguel Aschoff, MD Taking Active   amLODipine (NORVASC) 10 MG tablet [id number] No Take 1 tablet (10 mg total) by mouth daily. Miguel Aschoff, MD Taking Active   aspirin EC 81 MG tablet [id number] No Take 1 tablet (81 mg total) by mouth daily. Miguel Aschoff, MD Taking Active Self  Baclofen 5 MG TABS [id number]  Take 1 tablet (5 mg total)  by mouth 2 (two) times daily as needed (muscle spasms). Miguel Aschoff, MD  Active   Blood Glucose Monitoring Suppl (ACCU-CHEK GUIDE) w/Device KIT [id number] No Use to check blood sugar up to 1 times a day Miguel Aschoff, MD Taking Active   Continuous Glucose Sensor (DEXCOM G7 SENSOR) [place] [id number] No Place new sensor every 10 days. Use to monitor blood sugar continuously. Miguel Aschoff, MD Taking Active   DULoxetine (CYMBALTA) 60 MG capsule [id number]  Take 1 capsule (60 mg total) by mouth daily. Miguel Aschoff, MD  Active   glucose blood (ACCU-CHEK GUIDE TEST) test strip [id number] No Use as back up to CGM sensors up to 1 time a day Miguel Aschoff, MD Taking Active   insulin degludec (TRESIBA FLEXTOUCH) 200 UNIT/ML FlexTouch Pen [id number]  Inject 65 Units into the skin daily. Miguel Aschoff, MD  Active   Insulin Pen Needle 32G X 4 MM MISC [id number]  Use to inject insulin 4 (four) times daily. Miguel Aschoff, MD  Active   lidocaine (LIDODERM) 5 % [id number]  Place 1 patch onto the skin daily. Remove & Discard patch within 12 hours or as directed by MD Gwyneth Sprout, MD  Active   loperamide (IMODIUM A-D) 2 MG capsule [id number] No Take 1 capsule (2 mg total) by mouth as needed for diarrhea or loose stools. Miguel Aschoff, MD Taking Active   metFORMIN (GLUCOPHAGE-XR) 500 MG 24 hr tablet [id number]  Take 1 tablet (500 mg total) by mouth daily with breakfast. Miguel Aschoff, MD  Active   methocarbamol (ROBAXIN) 500 MG tablet [id number]  Take 1 tablet (500 mg total) by mouth 2 (two) times daily. Gwyneth Sprout, MD  Active   metoCLOPramide (REGLAN) 5 MG tablet [id number]  Take 1 tablet (5 mg total) by mouth 3 (three) times daily before meals. Miguel Aschoff, MD  Active   metoprolol succinate (TOPROL-XL) 100 MG 24 hr tablet [id number]  Take 1 tablet (100 mg total) by mouth daily. TAKE WITH OR IMMEDIATELY FOLLOWING A MEAL. Miguel Aschoff, MD   Active   MYRBETRIQ 50 MG TB24 tablet [id number]  Take 1 tablet (50 mg total) by mouth daily. Miguel Aschoff, MD  Active   nystatin cream (MYCOSTATIN) [id number] No Apply 1 Application topically 2 (two) times daily. Miguel Aschoff, MD Taking Active   nystatin powder [id number] No Apply 1 Application topically 3 (three) times daily. Miguel Aschoff, MD Taking Active   ondansetron (ZOFRAN-ODT) 4 MG disintegrating tablet [id number]  Take 1 tablet (4 mg total) by mouth every 8 (eight) hours as needed for nausea or vomiting. Miguel Aschoff, MD  Active   oxyCODONE (OXY IR/ROXICODONE) 5 MG immediate release tablet [id number]  Take 1 tablet (5 mg total) by mouth every 4 (four) hours as needed for severe pain (pain score 7-10). Miguel Aschoff, MD  Active   pantoprazole (PROTONIX) 40 MG tablet [id number] No Take 1 tablet (40 mg total) by mouth daily. Miguel Aschoff, MD Taking Active   Discontinued 01/23/14 1328 (Reorder)   pregabalin (LYRICA) 200 MG capsule [id number]  Take 1 capsule (200 mg total) by mouth 3 (three) times daily. Miguel Aschoff, MD  Active   rosuvastatin (CRESTOR) 20 MG tablet [id number]  Take 1 tablet (20 mg total) by mouth in the morning. Miguel Aschoff, MD  Active   Semaglutide, 1 MG/DOSE, (OZEMPIC, 1 MG/DOSE,) 4 MG/3ML SOPN [id number] No Inject 1 mg into the skin once a week. Miguel Aschoff, MD Taking Active   spironolactone (ALDACTONE) 50 MG tablet [id number]  Take 1 tablet (50 mg total) by mouth daily. Miguel Aschoff, MD  Active   umeclidinium-vilanterol [facility] ELLIPTA) 62.5-25 MCG/ACT AEPB [id number] No Inhale 1 puff into the lungs in the morning. Miguel Aschoff, MD Taking Active             Home Care and Equipment/Supplies: Were Home Health Services Ordered?: NA Any new equipment or medical supplies ordered?: NA  Functional Questionnaire: Do you need assistance with bathing/showering or dressing?: No Do you need assistance  with meal preparation?: No Do you need assistance with eating?: No Do you have difficulty maintaining continence: No Do you need assistance with getting out of bed/getting out of a chair/moving?: No Do you have difficulty managing or taking your medications?: No  Follow up appointments reviewed: PCP Follow-up appointment confirmed?: No (no avail appt, sent message to staff to schedule) Specialist Hospital Follow-up appointment confirmed?: NA Do you need transportation to your follow-up appointment?: No Do you understand care options if your condition(s) worsen?: Yes-patient verbalized understanding    SIGNATURE Karena Addison, LPN [facility] Nurse Health Advisor Direct Dial [phone]

## 2024-03-17 NOTE — Telephone Encounter (Signed)
 Copied from CRM [id number]. Topic: Appointments - Appointment Scheduling >> Mar 17, 2024 11:12 AM Tiffany H wrote: Patient/patient representative is calling to schedule an appointment. Refer to attachments for appointment information.   Patient was discharged from ER on 03/15/24. Patient advised that she is still in pain. Pain reminiscent of childbirth.   Patient would like to be seen any day within the next week. Preferrably after 10:30AM. First available appointment for Dr. Mayford Knife is in April. Please assist. >> Mar 17, 2024 11:16 AM Tiffany H wrote: Patient declined to be Triaged. Patient advised that she has pain medication.

## 2024-03-21 ENCOUNTER — Telehealth: Payer: Self-pay

## 2024-03-21 ENCOUNTER — Telehealth: Payer: Self-pay | Admitting: *Deleted

## 2024-03-21 DIAGNOSIS — E118 Type 2 diabetes mellitus with unspecified complications: Secondary | ICD-10-CM

## 2024-03-21 NOTE — Progress Notes (Unsigned)
{  CARE GUIDE NOTES:27889} 

## 2024-03-22 ENCOUNTER — Other Ambulatory Visit: Payer: Self-pay

## 2024-03-22 ENCOUNTER — Emergency Department (HOSPITAL_COMMUNITY)

## 2024-03-22 ENCOUNTER — Emergency Department (HOSPITAL_COMMUNITY)
Admission: EM | Admit: 2024-03-22 | Discharge: 2024-03-22 | Disposition: A | Discharge status: Home / Self Care | Source: Home / Self Care | Attending: Emergency Medicine | Admitting: Emergency Medicine

## 2024-03-22 DIAGNOSIS — J449 Chronic obstructive pulmonary disease, unspecified: Secondary | ICD-10-CM | POA: Insufficient documentation

## 2024-03-22 DIAGNOSIS — E1165 Type 2 diabetes mellitus with hyperglycemia: Secondary | ICD-10-CM | POA: Diagnosis not present

## 2024-03-22 DIAGNOSIS — I5032 Chronic diastolic (congestive) heart failure: Secondary | ICD-10-CM | POA: Diagnosis not present

## 2024-03-22 DIAGNOSIS — Z794 Long term (current) use of insulin: Secondary | ICD-10-CM | POA: Insufficient documentation

## 2024-03-22 DIAGNOSIS — M79671 Pain in right foot: Secondary | ICD-10-CM | POA: Diagnosis not present

## 2024-03-22 DIAGNOSIS — E785 Hyperlipidemia, unspecified: Secondary | ICD-10-CM | POA: Diagnosis not present

## 2024-03-22 DIAGNOSIS — E1122 Type 2 diabetes mellitus with diabetic chronic kidney disease: Secondary | ICD-10-CM | POA: Insufficient documentation

## 2024-03-22 DIAGNOSIS — I5030 Unspecified diastolic (congestive) heart failure: Secondary | ICD-10-CM | POA: Insufficient documentation

## 2024-03-22 DIAGNOSIS — Z89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): Secondary | ICD-10-CM | POA: Diagnosis not present

## 2024-03-22 DIAGNOSIS — I130 Hypertensive heart and chronic kidney disease with heart failure and stage 1 through stage 4 chronic kidney disease, or unspecified chronic kidney disease: Secondary | ICD-10-CM | POA: Insufficient documentation

## 2024-03-22 DIAGNOSIS — Z7982 Long term (current) use of aspirin: Secondary | ICD-10-CM | POA: Insufficient documentation

## 2024-03-22 DIAGNOSIS — E1142 Type 2 diabetes mellitus with diabetic polyneuropathy: Secondary | ICD-10-CM | POA: Diagnosis not present

## 2024-03-22 DIAGNOSIS — E1139 Type 2 diabetes mellitus with other diabetic ophthalmic complication: Secondary | ICD-10-CM | POA: Diagnosis not present

## 2024-03-22 DIAGNOSIS — E1169 Type 2 diabetes mellitus with other specified complication: Secondary | ICD-10-CM | POA: Diagnosis not present

## 2024-03-22 DIAGNOSIS — J4489 Other specified chronic obstructive pulmonary disease: Secondary | ICD-10-CM | POA: Diagnosis not present

## 2024-03-22 DIAGNOSIS — L97519 Non-pressure chronic ulcer of other part of right foot with unspecified severity: Secondary | ICD-10-CM | POA: Diagnosis not present

## 2024-03-22 DIAGNOSIS — Z7984 Long term (current) use of oral hypoglycemic drugs: Secondary | ICD-10-CM | POA: Insufficient documentation

## 2024-03-22 DIAGNOSIS — M86271 Subacute osteomyelitis, right ankle and foot: Secondary | ICD-10-CM | POA: Diagnosis not present

## 2024-03-22 DIAGNOSIS — Z79899 Other long term (current) drug therapy: Secondary | ICD-10-CM | POA: Insufficient documentation

## 2024-03-22 DIAGNOSIS — B950 Streptococcus, group A, as the cause of diseases classified elsewhere: Secondary | ICD-10-CM | POA: Diagnosis not present

## 2024-03-22 DIAGNOSIS — Z89431 Acquired absence of right foot: Secondary | ICD-10-CM | POA: Diagnosis not present

## 2024-03-22 DIAGNOSIS — Z89512 Acquired absence of left leg below knee: Secondary | ICD-10-CM | POA: Diagnosis not present

## 2024-03-22 DIAGNOSIS — L089 Local infection of the skin and subcutaneous tissue, unspecified: Secondary | ICD-10-CM | POA: Diagnosis not present

## 2024-03-22 DIAGNOSIS — X58XXXA Exposure to other specified factors, initial encounter: Secondary | ICD-10-CM | POA: Insufficient documentation

## 2024-03-22 DIAGNOSIS — M7989 Other specified soft tissue disorders: Secondary | ICD-10-CM | POA: Diagnosis not present

## 2024-03-22 DIAGNOSIS — K760 Fatty (change of) liver, not elsewhere classified: Secondary | ICD-10-CM | POA: Diagnosis not present

## 2024-03-22 DIAGNOSIS — N182 Chronic kidney disease, stage 2 (mild): Secondary | ICD-10-CM | POA: Insufficient documentation

## 2024-03-22 DIAGNOSIS — F17210 Nicotine dependence, cigarettes, uncomplicated: Secondary | ICD-10-CM | POA: Diagnosis not present

## 2024-03-22 DIAGNOSIS — G894 Chronic pain syndrome: Secondary | ICD-10-CM | POA: Diagnosis not present

## 2024-03-22 DIAGNOSIS — E1152 Type 2 diabetes mellitus with diabetic peripheral angiopathy with gangrene: Secondary | ICD-10-CM | POA: Diagnosis not present

## 2024-03-22 DIAGNOSIS — S91301A Unspecified open wound, right foot, initial encounter: Secondary | ICD-10-CM | POA: Insufficient documentation

## 2024-03-22 DIAGNOSIS — Z8616 Personal history of COVID-19: Secondary | ICD-10-CM | POA: Diagnosis not present

## 2024-03-22 DIAGNOSIS — E11621 Type 2 diabetes mellitus with foot ulcer: Secondary | ICD-10-CM | POA: Diagnosis not present

## 2024-03-22 LAB — BASIC METABOLIC PANEL WITH GFR
Anion gap: 11 (ref 5–15)
BUN: 7 mg/dL — ABNORMAL LOW (ref 8–23)
CO2: 23 mmol/L (ref 22–32)
Calcium: 8.6 mg/dL — ABNORMAL LOW (ref 8.9–10.3)
Chloride: 103 mmol/L (ref 98–111)
Creatinine, Ser: 0.93 mg/dL (ref 0.44–1.00)
GFR, Estimated: >60 mL/min (ref >60)
Glucose, Bld: 233 mg/dL — ABNORMAL HIGH (ref 70–99)
Potassium: 3.7 mmol/L (ref 3.5–5.1)
Sodium: 137 mmol/L (ref 135–145)

## 2024-03-22 LAB — CBC WITH DIFFERENTIAL/PLATELET
Abs Immature Granulocytes: 0.05 10*3/uL (ref 0.00–0.07)
Basophils Absolute: 0.1 10*3/uL (ref 0.0–0.1)
Basophils Relative: 0 %
Eosinophils Absolute: 0.2 10*3/uL (ref 0.0–0.5)
Eosinophils Relative: 2 %
HCT: 38.3 % (ref 36.0–46.0)
Hemoglobin: 12.5 g/dL (ref 12.0–15.0)
Immature Granulocytes: 0 %
Lymphocytes Relative: 22 %
Lymphs Abs: 2.5 10*3/uL (ref 0.7–4.0)
MCH: 31.2 pg (ref 26.0–34.0)
MCHC: 32.6 g/dL (ref 30.0–36.0)
MCV: 95.5 fL (ref 80.0–100.0)
Monocytes Absolute: 0.8 10*3/uL (ref 0.1–1.0)
Monocytes Relative: 7 %
Neutro Abs: 8 10*3/uL — ABNORMAL HIGH (ref 1.7–7.7)
Neutrophils Relative %: 69 %
Platelets: 171 10*3/uL (ref 150–400)
RBC: 4.01 MIL/uL (ref 3.87–5.11)
RDW: 13.5 % (ref 11.5–15.5)
WBC: 11.5 10*3/uL — ABNORMAL HIGH (ref 4.0–10.5)
nRBC: 0 % (ref 0.0–0.2)

## 2024-03-22 MED ORDER — SILVER SULFADIAZINE 1 % EX CREA
TOPICAL_CREAM | Freq: Once | CUTANEOUS | Status: AC
  Filled 2024-03-22: qty 85

## 2024-03-22 MED ORDER — DOXYCYCLINE HYCLATE 100 MG PO CAPS
100.0000 mg | ORAL_CAPSULE | Freq: Two times a day (BID) | ORAL | 0 refills | Status: DC
  Filled 2024-03-22 (×2): qty 14, 7d supply, fill #0

## 2024-03-22 MED ORDER — SILVER SULFADIAZINE 1 % EX CREA
1.0000 | TOPICAL_CREAM | Freq: Every day | CUTANEOUS | 0 refills | Status: DC
  Filled 2024-03-22: qty 50, 15d supply, fill #0

## 2024-03-22 MED ORDER — DOXYCYCLINE HYCLATE 100 MG PO TABS
100.0000 mg | ORAL_TABLET | Freq: Once | ORAL | Status: AC
  Administered 2024-03-22: 100 mg via ORAL
  Filled 2024-03-22: qty 1

## 2024-03-22 MED ORDER — OXYCODONE HCL 5 MG PO TABS
5.0000 mg | ORAL_TABLET | Freq: Once | ORAL | Status: AC
  Administered 2024-03-22: 5 mg via ORAL
  Filled 2024-03-22: qty 1

## 2024-03-22 NOTE — ED Triage Notes (Signed)
 Pt. Stated, Ive had blisters on my right leg and its opened. My rt. Foot has a blister on the end of the foot.

## 2024-03-22 NOTE — ED Provider Notes (Signed)
 [place] EMERGENCY DEPARTMENT AT [facility] Provider Note   CSN: [id number] Arrival date & time: 03/22/24  1036     History  Chief Complaint  Patient presents with   Foot Pain   foot blister   leg leaking fluid    Jennifer [name] is a 63 y.o. female.   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  Pt states it started on sat or Sunday.  She normally wears a compression sock and states she has had blisters on her leg but she was rubbing them and they popped and her skin came off.  She was going to follow up with Dr. Loreta Ave with podiatry but became worried and came here for evaluation.  She states they hurt but has not had fever or redness.  She does use the compression sock at times but feels like the swelling in her leg is worse and it is draining constantly.  Also having trouble with her allergies and is coughing and sneezing with some sob.  She denies any injury  The history is provided by the patient and medical records.  Foot Pain       Home Medications Prior to Admission medications   Medication Sig Start Date End Date Taking? Authorizing Provider  Accu-Chek Softclix Lancets lancets Use as back up to CGM sensors up to 1 time a day 11/17/23   Miguel Aschoff, MD  acetaminophen (TYLENOL) 650 MG CR tablet Take 2 tablets (1,300 mg total) by mouth every 8 (eight) hours as needed for pain. 02/16/23   Mapp, Gaylyn Cheers, MD  albuterol (PROAIR HFA) 108 (90 Base) MCG/ACT inhaler INHALE 2 PUFFS BY MOUTH EVERY 6 HOURS AS NEEDED FOR WHEEZING 11/04/23   Miguel Aschoff, MD  amLODipine (NORVASC) 10 MG tablet Take 1 tablet  (10 mg total) by mouth daily. 08/06/23   Miguel Aschoff, MD  aspirin EC 81 MG tablet Take 1 tablet (81 mg total) by mouth daily. 09/19/21   Miguel Aschoff, MD  Baclofen 5 MG TABS Take 1 tablet (5 mg total) by mouth 2 (two) times daily as needed (muscle spasms). 02/18/24   Miguel Aschoff, MD  Blood Glucose Monitoring Suppl (ACCU-CHEK GUIDE) w/Device KIT Use to check blood sugar up to 1 times a day 11/17/23   Miguel Aschoff, MD  Continuous Glucose Sensor (DEXCOM G7 SENSOR) MISC Place new sensor every 10 days. Use to monitor blood sugar continuously. 11/17/23   Miguel Aschoff, MD  DULoxetine (CYMBALTA) 60 MG capsule Take 1 capsule (60 mg total) by mouth daily. 02/18/24   Miguel Aschoff, MD  glucose blood (ACCU-CHEK GUIDE TEST) test strip Use as back up to CGM sensors up to 1 time a day 11/17/23   Miguel Aschoff, MD  insulin degludec (TRESIBA FLEXTOUCH) 200 UNIT/ML FlexTouch Pen Inject 65 Units into the skin daily. 02/18/24   Miguel Aschoff, MD  Insulin Pen Needle 32G X 4 MM MISC Use to inject insulin 4 (four) times daily. 02/18/24   Miguel Aschoff, MD  lidocaine (LIDODERM) 5 % Place 1 patch onto the skin daily. Remove & Discard patch within 12  hours or as directed by MD 03/15/24   Gwyneth Sprout, MD  loperamide (IMODIUM A-D) 2 MG capsule Take 1 capsule (2 mg total) by mouth as needed for diarrhea or loose stools. 08/06/23   Miguel Aschoff, MD  metFORMIN (GLUCOPHAGE-XR) 500 MG 24 hr tablet Take 1 tablet (500 mg total) by mouth daily with breakfast. 08/06/23   Miguel Aschoff, MD  methocarbamol (ROBAXIN) 500 MG tablet Take 1 tablet (500 mg total) by mouth 2 (two) times daily. 03/15/24   Gwyneth Sprout, MD  metoCLOPramide (REGLAN) 5 MG tablet Take 1 tablet (5 mg total) by mouth 3 (three) times daily before meals. 02/18/24   Miguel Aschoff, MD  metoprolol succinate (TOPROL-XL) 100 MG 24 hr tablet Take 1 tablet (100 mg total) by mouth daily.  TAKE WITH OR IMMEDIATELY FOLLOWING A MEAL. 02/18/24   Miguel Aschoff, MD  MYRBETRIQ 50 MG TB24 tablet Take 1 tablet (50 mg total) by mouth daily. 02/18/24   Miguel Aschoff, MD  nystatin cream (MYCOSTATIN) Apply 1 Application topically 2 (two) times daily. 10/20/23   Miguel Aschoff, MD  nystatin powder Apply 1 Application topically 3 (three) times daily. 10/20/23   Miguel Aschoff, MD  ondansetron (ZOFRAN-ODT) 4 MG disintegrating tablet Take 1 tablet (4 mg total) by mouth every 8 (eight) hours as needed for nausea or vomiting. 02/18/24   Miguel Aschoff, MD  oxyCODONE (OXY IR/ROXICODONE) 5 MG immediate release tablet Take 1 tablet (5 mg total) by mouth every 4 (four) hours as needed for severe pain (pain score 7-10). 03/10/24   Miguel Aschoff, MD  pantoprazole (PROTONIX) 40 MG tablet Take 1 tablet (40 mg total) by mouth daily. 07/14/23   Miguel Aschoff, MD  pregabalin (LYRICA) 200 MG capsule Take 1 capsule (200 mg total) by mouth 3 (three) times daily. 03/10/24   Miguel Aschoff, MD  rosuvastatin (CRESTOR) 20 MG tablet Take 1 tablet (20 mg total) by mouth in the morning. 02/18/24   Miguel Aschoff, MD  Semaglutide, 1 MG/DOSE, (OZEMPIC, 1 MG/DOSE,) 4 MG/3ML SOPN Inject 1 mg into the skin once a week. 08/06/23   Miguel Aschoff, MD  spironolactone (ALDACTONE) 50 MG tablet Take 1 tablet (50 mg total) by mouth daily. 02/18/24   Miguel Aschoff, MD  umeclidinium-vilanterol [facility] ELLIPTA) 62.5-25 MCG/ACT AEPB Inhale 1 puff into the lungs in the morning. 08/06/23   Miguel Aschoff, MD  Potassium Chloride ER 20 MEQ TBCR Take 40 mEq by mouth daily. 01/02/14 01/23/14  Belia Heman, MD      Allergies    Benazepril, Chantix [varenicline], Ioversol, Morphine sulfate, Pineapple, Abilify [aripiprazole], Iohexol, and Ivp dye [iodinated contrast media]    Review of Systems   Review of Systems  Physical Exam Updated Vital Signs BP (!) 169/75 (BP Location:  Right Arm)   Pulse 72   Temp 98.4 F (36.9 C)   Resp 15   SpO2 97%  Physical Exam Vitals and nursing note reviewed.  Constitutional:      General: She is in acute distress.     Appearance: She is well-developed.  HENT:     Head: Normocephalic and atraumatic.  Eyes:     Pupils: Pupils are equal, round, and reactive to light.  Cardiovascular:     Rate and Rhythm: Normal rate and regular rhythm.     Heart sounds: Normal heart sounds. No murmur heard.    No friction rub.  Pulmonary:  Effort: Pulmonary effort is normal.     Breath sounds: Normal breath sounds. No wheezing or rales.  Abdominal:     General: Bowel sounds are normal. There is no distension.     Palpations: Abdomen is soft.     Tenderness: There is no abdominal tenderness. There is no guarding or rebound.  Musculoskeletal:        General: No tenderness. Normal range of motion.     Comments: Significant pitting edema in the RLL with weeping blisters and 2 areas of unroofed blisters with pink blanching wound without drainage.  Also 3x3cm wound present on the right lateral foot.  The lateral portion of her amputation site with unroofed blister and echar present without foul drainage or significant foot erythema.  Left BKA  Skin:    General: Skin is warm and dry.     Findings: No rash.  Neurological:     Mental Status: She is alert and oriented to person, place, and time.     Cranial Nerves: No cranial nerve deficit.  Psychiatric:        Behavior: Behavior normal.        ED Results / Procedures / Treatments   Labs (all labs ordered are listed, but only abnormal results are displayed) Labs Reviewed  CBC WITH DIFFERENTIAL/PLATELET - Abnormal; Notable for the following components:      Result Value   WBC 11.5 (*)    Neutro Abs 8.0 (*)    All other components within normal limits  BASIC METABOLIC PANEL WITH GFR - Abnormal; Notable for the following components:   Glucose, Bld 233 (*)    BUN 7 (*)    Calcium  8.6 (*)    All other components within normal limits  BRAIN NATRIURETIC PEPTIDE    EKG None  Radiology DG Foot Complete Right Result Date: 03/22/2024 CLINICAL DATA:  History of transmetatarsal amputation. Multiple blisters on right foot and leg. EXAM: RIGHT FOOT COMPLETE - 3+ VIEW COMPARISON:  None Available. FINDINGS: Interval removal of cutaneous staples at the distal transsection stump. Status post transmetatarsal amputation. Transmetatarsal transsection margins demonstrate areas of mild cortical indistinctness and irregularity, predominantly involving the second through fifth metatarsal transsection margins. Diffuse soft tissue swelling, most pronounced at the dorsal midfoot and transsection stump with areas of cutaneous irregularity and possible wound with soft tissue gas along the lateral transsection stump, overlying the fifth metatarsal margin. IMPRESSION: 1. Cortical indistinctness and irregularity of the transmetatarsal transsection margins, predominantly involving the second through fifth metatarsals, is new since the prior exam and could relate to postsurgical change, however, osteomyelitis can not be excluded. 2. Possible wound with soft tissue gas along the lateral transsection stump, overlying the fifth metatarsal margin. Electronically Signed   By: Hart Robinsons M.D.   On: 03/22/2024 12:53    Procedures Procedures    Medications Ordered in ED Medications  doxycycline (VIBRA-TABS) tablet 100 mg (has no administration in time range)  silver sulfADIAZINE (SILVADENE) 1 % cream (has no administration in time range)  oxyCODONE (Oxy IR/ROXICODONE) immediate release tablet 5 mg (5 mg Oral Given 03/22/24 1146)    ED Course/ Medical Decision Making/ A&P                                 Medical Decision Making Amount and/or Complexity of Data Reviewed External Data Reviewed: notes. Labs: ordered. Decision-making details documented in ED Course. Radiology: ordered and independent  interpretation performed. Decision-making details documented in ED Course.  Risk Prescription drug management.   Pt with multiple medical problems and comorbidities and presenting today with a complaint that caries a high risk for morbidity and mortality.  Pt presenting with right leg wound.  Concern for possible osteomyelitis of the right foot on the lateral side of the foot.  Also concern for worsening AKI due to worsening swelling per pt vs lymphedema.  No prior hx of CHF and pt is not on diuretics.  Labs and images pending.  Will touch base with Dr. Loreta Ave when imaging is back.  1:50 PM I independently interpreted patient's labs and CBC with minimal white count of 11.5 with normal hemoglobin and platelet count, BMP with normal creatinine today at 0.93 with normal electrolytes.  I have independently visualized and interpreted pt's images today. X-ray did show some osseous changes near the amputation site however it is on multiple toes.  Radiology reports cortical indistinctness and irregularity of the transmetatarsal's margin involving the 2nd through 5th metatarsals new since her amputation.  And most likely this is related to postsurgical change less likely to be osteomyelitis.  Concern for possible early wound infection but patient is not having systemic symptoms.  She has been afebrile here and has not had fever at home.  Discussed the case with Dr. Ardelle Anton the patient's podiatrist.  Will use Silvadene on the wound and place a dressing.  She was also started on doxycycline and they will call her for a follow-up later this week.  Findings discussed with the patient.  At this time feel that she is stable for discharge home.         Final Clinical Impression(s) / ED Diagnoses Final diagnoses:  Wound of right foot    Rx / DC Orders ED Discharge Orders     None         Gwyneth Sprout, MD 03/22/24 1350

## 2024-03-22 NOTE — ED Notes (Signed)
 Pt was stuck twice. Wasn't successful.

## 2024-03-22 NOTE — Discharge Instructions (Addendum)
 You will need to leave the bandage we have placed till tomorrow and then you can remove if you haven't seen Dr. Ardelle Anton by Thursday and put new cream and apply a new bandage.  Also start the antibiotics

## 2024-03-23 ENCOUNTER — Telehealth: Payer: Self-pay

## 2024-03-23 ENCOUNTER — Other Ambulatory Visit: Payer: Self-pay

## 2024-03-23 NOTE — Progress Notes (Unsigned)
 Complex Care Management Note Care Guide Note  03/23/2024 Name: Jennifer [name] MRN: [id number] DOB: 1961/05/31   Complex Care Management Outreach Attempts: An unsuccessful telephone outreach was attempted today to offer the patient information about available complex care management services.  Follow Up Plan:  Additional outreach attempts will be made to offer the patient complex care management information and services.   Encounter Outcome:  No Answer  Gwenevere Ghazi  [facility]  [facility], [facility] Guide  Direct Dial: [phone]  Fax [phone]

## 2024-03-23 NOTE — Transitions of Care (Post Inpatient/ED Visit) (Signed)
 03/23/2024  Name: Jennifer [name] MRN: [id number] DOB: February 14, 1961  Today's TOC FU Call Status: TOC FU Call Complete Date: 03/23/24 Patient's Name and Date of Birth confirmed.  Transition Care Management Follow-up Telephone Call Date of Discharge: 03/22/24 Discharge Facility: [facility]) Type of Discharge: Emergency Department Reason for ED Visit: Other: (wound) How have you been since you were released from the hospital?: Same Any questions or concerns?: No  Items Reviewed: Did you receive and understand the discharge instructions provided?: Yes Medications obtained,verified, and reconciled?: Yes (Medications Reviewed) Any new allergies since your discharge?: No Dietary orders reviewed?: Yes People in Home: child(ren), adult, sibling(s)  Medications Reviewed Today: Medications Reviewed Today     Reviewed by Karena Addison, LPN (Licensed Practical Nurse) on 03/23/24 at 0915  Med List Status: <None>   Medication Order Taking? Sig Documenting Provider Last Dose Status Informant  Accu-Chek Softclix Lancets lancets [id number] No Use as back up to CGM sensors up to 1 time a day Miguel Aschoff, MD Taking Active   acetaminophen (TYLENOL) 650 MG CR tablet [id number] No Take 2 tablets (1,300 mg total) by mouth every 8 (eight) hours as needed for pain. Mapp, Gaylyn Cheers, MD Taking Active   albuterol [facility] HFA) [phone] Base) MCG/ACT inhaler [id number] No INHALE 2 PUFFS BY MOUTH EVERY 6 HOURS AS NEEDED FOR WHEEZING Miguel Aschoff, MD Taking Active   amLODipine (NORVASC) 10 MG tablet [id number] No Take 1 tablet (10 mg total) by mouth daily. Miguel Aschoff, MD Taking Active   aspirin EC 81 MG tablet [id number] No Take 1 tablet (81 mg total) by mouth daily. Miguel Aschoff, MD Taking Active Self  Baclofen 5 MG TABS [id number]  Take 1 tablet (5 mg total) by mouth 2 (two) times daily as needed (muscle spasms). Miguel Aschoff, MD  Active   Blood Glucose Monitoring Suppl  (ACCU-CHEK GUIDE) w/Device KIT [id number] No Use to check blood sugar up to 1 times a day Miguel Aschoff, MD Taking Active   Continuous Glucose Sensor (DEXCOM G7 SENSOR) [place] [id number] No Place new sensor every 10 days. Use to monitor blood sugar continuously. Miguel Aschoff, MD Taking Active   doxycycline (VIBRAMYCIN) 100 MG capsule [id number]  Take 1 capsule (100 mg total) by mouth 2 (two) times daily. Gwyneth Sprout, MD  Active   DULoxetine (CYMBALTA) 60 MG capsule [id number]  Take 1 capsule (60 mg total) by mouth daily. Miguel Aschoff, MD  Active   glucose blood (ACCU-CHEK GUIDE TEST) test strip [id number] No Use as back up to CGM sensors up to 1 time a day Miguel Aschoff, MD Taking Active   insulin degludec (TRESIBA FLEXTOUCH) 200 UNIT/ML FlexTouch Pen [id number]  Inject 65 Units into the skin daily. Miguel Aschoff, MD  Active   Insulin Pen Needle 32G X 4 MM MISC [id number]  Use to inject insulin 4 (four) times daily. Miguel Aschoff, MD  Active   lidocaine (LIDODERM) 5 % [id number]  Place 1 patch onto the skin daily. Remove & Discard patch within 12 hours or as directed by MD Gwyneth Sprout, MD  Active   loperamide (IMODIUM A-D) 2 MG capsule [id number] No Take 1 capsule (2 mg total) by mouth as needed for diarrhea or loose stools. Miguel Aschoff, MD Taking Active   metFORMIN (GLUCOPHAGE-XR) 500 MG 24 hr tablet [id number]  Take 1 tablet (500 mg total) by mouth daily with breakfast. Miguel Aschoff, MD  Active   methocarbamol (  ROBAXIN) 500 MG tablet [id number]  Take 1 tablet (500 mg total) by mouth 2 (two) times daily. Gwyneth Sprout, MD  Active   metoCLOPramide (REGLAN) 5 MG tablet [id number]  Take 1 tablet (5 mg total) by mouth 3 (three) times daily before meals. Miguel Aschoff, MD  Active   metoprolol succinate (TOPROL-XL) 100 MG 24 hr tablet [id number]  Take 1 tablet (100 mg total) by mouth daily. TAKE WITH OR IMMEDIATELY FOLLOWING A MEAL.  Miguel Aschoff, MD  Active   MYRBETRIQ 50 MG TB24 tablet [id number]  Take 1 tablet (50 mg total) by mouth daily. Miguel Aschoff, MD  Active   nystatin cream (MYCOSTATIN) [id number] No Apply 1 Application topically 2 (two) times daily. Miguel Aschoff, MD Taking Active   nystatin powder [id number] No Apply 1 Application topically 3 (three) times daily. Miguel Aschoff, MD Taking Active   ondansetron (ZOFRAN-ODT) 4 MG disintegrating tablet [id number]  Take 1 tablet (4 mg total) by mouth every 8 (eight) hours as needed for nausea or vomiting. Miguel Aschoff, MD  Active   oxyCODONE (OXY IR/ROXICODONE) 5 MG immediate release tablet [id number]  Take 1 tablet (5 mg total) by mouth every 4 (four) hours as needed for severe pain (pain score 7-10). Miguel Aschoff, MD  Active   pantoprazole (PROTONIX) 40 MG tablet [id number] No Take 1 tablet (40 mg total) by mouth daily. Miguel Aschoff, MD Taking Active   Discontinued 01/23/14 1328 (Reorder)   pregabalin (LYRICA) 200 MG capsule [id number]  Take 1 capsule (200 mg total) by mouth 3 (three) times daily. Miguel Aschoff, MD  Active   rosuvastatin (CRESTOR) 20 MG tablet [id number]  Take 1 tablet (20 mg total) by mouth in the morning. Miguel Aschoff, MD  Active   Semaglutide, 1 MG/DOSE, (OZEMPIC, 1 MG/DOSE,) 4 MG/3ML SOPN [id number] No Inject 1 mg into the skin once a week. Miguel Aschoff, MD Taking Active   silver sulfADIAZINE (SILVADENE) 1 % cream [id number]  Apply 1 Application topically daily. To the wounds on your leg and foot Gwyneth Sprout, MD  Active   spironolactone (ALDACTONE) 50 MG tablet [id number]  Take 1 tablet (50 mg total) by mouth daily. Miguel Aschoff, MD  Active   umeclidinium-vilanterol [facility] ELLIPTA) 62.5-25 MCG/ACT AEPB [id number] No Inhale 1 puff into the lungs in the morning. Miguel Aschoff, MD Taking Active             Home Care and Equipment/Supplies: Were Home Health  Services Ordered?: NA Any new equipment or medical supplies ordered?: NA  Functional Questionnaire: Do you need assistance with bathing/showering or dressing?: No Do you need assistance with meal preparation?: No Do you need assistance with eating?: No Do you have difficulty maintaining continence: No Do you need assistance with getting out of bed/getting out of a chair/moving?: No Do you have difficulty managing or taking your medications?: No  Follow up appointments reviewed: PCP Follow-up appointment confirmed?: NA Specialist Hospital Follow-up appointment confirmed?: Yes Date of Specialist follow-up appointment?: 03/24/24 Follow-Up Specialty Provider:: wound Do you need transportation to your follow-up appointment?: No Do you understand care options if your condition(s) worsen?: Yes-patient verbalized understanding    SIGNATURE Karena Addison, LPN [facility] Nurse Health Advisor Direct Dial [phone]

## 2024-03-24 ENCOUNTER — Ambulatory Visit (INDEPENDENT_AMBULATORY_CARE_PROVIDER_SITE_OTHER): Admitting: Podiatry

## 2024-03-24 ENCOUNTER — Encounter (HOSPITAL_COMMUNITY): Payer: Self-pay

## 2024-03-24 ENCOUNTER — Ambulatory Visit: Admitting: Podiatry

## 2024-03-24 ENCOUNTER — Emergency Department (HOSPITAL_COMMUNITY)

## 2024-03-24 ENCOUNTER — Encounter: Payer: Self-pay | Admitting: Podiatry

## 2024-03-24 ENCOUNTER — Inpatient Hospital Stay (HOSPITAL_COMMUNITY)

## 2024-03-24 ENCOUNTER — Other Ambulatory Visit: Payer: Self-pay

## 2024-03-24 ENCOUNTER — Inpatient Hospital Stay (HOSPITAL_COMMUNITY)
Admission: EM | Admit: 2024-03-24 | Discharge: 2024-03-31 | DRG: 240 | Disposition: A | Discharge status: Home Health | Source: Ambulatory Visit | Attending: Internal Medicine | Admitting: Internal Medicine

## 2024-03-24 VITALS — BP 154/70 | HR 104 | Temp 99.5°F

## 2024-03-24 DIAGNOSIS — Z794 Long term (current) use of insulin: Secondary | ICD-10-CM

## 2024-03-24 DIAGNOSIS — Z7982 Long term (current) use of aspirin: Secondary | ICD-10-CM

## 2024-03-24 DIAGNOSIS — I130 Hypertensive heart and chronic kidney disease with heart failure and stage 1 through stage 4 chronic kidney disease, or unspecified chronic kidney disease: Secondary | ICD-10-CM | POA: Diagnosis not present

## 2024-03-24 DIAGNOSIS — E1122 Type 2 diabetes mellitus with diabetic chronic kidney disease: Secondary | ICD-10-CM | POA: Diagnosis present

## 2024-03-24 DIAGNOSIS — Z8616 Personal history of COVID-19: Secondary | ICD-10-CM | POA: Diagnosis not present

## 2024-03-24 DIAGNOSIS — T8189XA Other complications of procedures, not elsewhere classified, initial encounter: Secondary | ICD-10-CM | POA: Diagnosis not present

## 2024-03-24 DIAGNOSIS — E1139 Type 2 diabetes mellitus with other diabetic ophthalmic complication: Secondary | ICD-10-CM | POA: Diagnosis present

## 2024-03-24 DIAGNOSIS — M797 Fibromyalgia: Secondary | ICD-10-CM | POA: Diagnosis present

## 2024-03-24 DIAGNOSIS — B3731 Acute candidiasis of vulva and vagina: Secondary | ICD-10-CM | POA: Diagnosis present

## 2024-03-24 DIAGNOSIS — E1169 Type 2 diabetes mellitus with other specified complication: Secondary | ICD-10-CM | POA: Diagnosis not present

## 2024-03-24 DIAGNOSIS — B950 Streptococcus, group A, as the cause of diseases classified elsewhere: Secondary | ICD-10-CM | POA: Diagnosis present

## 2024-03-24 DIAGNOSIS — N3946 Mixed incontinence: Secondary | ICD-10-CM | POA: Diagnosis present

## 2024-03-24 DIAGNOSIS — M86271 Subacute osteomyelitis, right ankle and foot: Secondary | ICD-10-CM | POA: Diagnosis not present

## 2024-03-24 DIAGNOSIS — G894 Chronic pain syndrome: Secondary | ICD-10-CM | POA: Diagnosis present

## 2024-03-24 DIAGNOSIS — E1165 Type 2 diabetes mellitus with hyperglycemia: Secondary | ICD-10-CM | POA: Diagnosis present

## 2024-03-24 DIAGNOSIS — M898X7 Other specified disorders of bone, ankle and foot: Secondary | ICD-10-CM | POA: Diagnosis not present

## 2024-03-24 DIAGNOSIS — E785 Hyperlipidemia, unspecified: Secondary | ICD-10-CM | POA: Diagnosis present

## 2024-03-24 DIAGNOSIS — I872 Venous insufficiency (chronic) (peripheral): Secondary | ICD-10-CM | POA: Diagnosis present

## 2024-03-24 DIAGNOSIS — E11621 Type 2 diabetes mellitus with foot ulcer: Secondary | ICD-10-CM

## 2024-03-24 DIAGNOSIS — R6889 Other general symptoms and signs: Secondary | ICD-10-CM | POA: Diagnosis not present

## 2024-03-24 DIAGNOSIS — Z5982 Transportation insecurity: Secondary | ICD-10-CM

## 2024-03-24 DIAGNOSIS — M869 Osteomyelitis, unspecified: Secondary | ICD-10-CM | POA: Diagnosis present

## 2024-03-24 DIAGNOSIS — L97512 Non-pressure chronic ulcer of other part of right foot with fat layer exposed: Secondary | ICD-10-CM

## 2024-03-24 DIAGNOSIS — I70201 Unspecified atherosclerosis of native arteries of extremities, right leg: Secondary | ICD-10-CM | POA: Diagnosis not present

## 2024-03-24 DIAGNOSIS — Z720 Tobacco use: Secondary | ICD-10-CM | POA: Diagnosis not present

## 2024-03-24 DIAGNOSIS — K760 Fatty (change of) liver, not elsewhere classified: Secondary | ICD-10-CM | POA: Diagnosis present

## 2024-03-24 DIAGNOSIS — L03115 Cellulitis of right lower limb: Secondary | ICD-10-CM | POA: Diagnosis not present

## 2024-03-24 DIAGNOSIS — Z89512 Acquired absence of left leg below knee: Secondary | ICD-10-CM

## 2024-03-24 DIAGNOSIS — F17210 Nicotine dependence, cigarettes, uncomplicated: Secondary | ICD-10-CM | POA: Diagnosis not present

## 2024-03-24 DIAGNOSIS — J449 Chronic obstructive pulmonary disease, unspecified: Secondary | ICD-10-CM | POA: Diagnosis not present

## 2024-03-24 DIAGNOSIS — Z79899 Other long term (current) drug therapy: Secondary | ICD-10-CM

## 2024-03-24 DIAGNOSIS — E1152 Type 2 diabetes mellitus with diabetic peripheral angiopathy with gangrene: Secondary | ICD-10-CM | POA: Diagnosis not present

## 2024-03-24 DIAGNOSIS — I739 Peripheral vascular disease, unspecified: Secondary | ICD-10-CM | POA: Diagnosis not present

## 2024-03-24 DIAGNOSIS — M549 Dorsalgia, unspecified: Secondary | ICD-10-CM | POA: Diagnosis present

## 2024-03-24 DIAGNOSIS — N182 Chronic kidney disease, stage 2 (mild): Secondary | ICD-10-CM | POA: Diagnosis not present

## 2024-03-24 DIAGNOSIS — Z800 Family history of malignant neoplasm of digestive organs: Secondary | ICD-10-CM

## 2024-03-24 DIAGNOSIS — E1142 Type 2 diabetes mellitus with diabetic polyneuropathy: Secondary | ICD-10-CM | POA: Diagnosis present

## 2024-03-24 DIAGNOSIS — I5032 Chronic diastolic (congestive) heart failure: Secondary | ICD-10-CM | POA: Diagnosis present

## 2024-03-24 DIAGNOSIS — F32A Depression, unspecified: Secondary | ICD-10-CM | POA: Diagnosis present

## 2024-03-24 DIAGNOSIS — M86171 Other acute osteomyelitis, right ankle and foot: Secondary | ICD-10-CM | POA: Diagnosis not present

## 2024-03-24 DIAGNOSIS — Z7902 Long term (current) use of antithrombotics/antiplatelets: Secondary | ICD-10-CM

## 2024-03-24 DIAGNOSIS — Z9889 Other specified postprocedural states: Secondary | ICD-10-CM | POA: Diagnosis not present

## 2024-03-24 DIAGNOSIS — L97519 Non-pressure chronic ulcer of other part of right foot with unspecified severity: Secondary | ICD-10-CM | POA: Diagnosis present

## 2024-03-24 DIAGNOSIS — Z90710 Acquired absence of both cervix and uterus: Secondary | ICD-10-CM

## 2024-03-24 DIAGNOSIS — F1120 Opioid dependence, uncomplicated: Secondary | ICD-10-CM | POA: Diagnosis present

## 2024-03-24 DIAGNOSIS — K219 Gastro-esophageal reflux disease without esophagitis: Secondary | ICD-10-CM | POA: Diagnosis present

## 2024-03-24 DIAGNOSIS — M79671 Pain in right foot: Secondary | ICD-10-CM | POA: Diagnosis not present

## 2024-03-24 DIAGNOSIS — E1369 Other specified diabetes mellitus with other specified complication: Secondary | ICD-10-CM | POA: Diagnosis not present

## 2024-03-24 DIAGNOSIS — Z89431 Acquired absence of right foot: Secondary | ICD-10-CM

## 2024-03-24 DIAGNOSIS — Z89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): Secondary | ICD-10-CM | POA: Diagnosis not present

## 2024-03-24 DIAGNOSIS — M7989 Other specified soft tissue disorders: Secondary | ICD-10-CM | POA: Diagnosis not present

## 2024-03-24 DIAGNOSIS — Z5986 Financial insecurity: Secondary | ICD-10-CM

## 2024-03-24 DIAGNOSIS — I70234 Atherosclerosis of native arteries of right leg with ulceration of heel and midfoot: Secondary | ICD-10-CM | POA: Diagnosis not present

## 2024-03-24 DIAGNOSIS — Z825 Family history of asthma and other chronic lower respiratory diseases: Secondary | ICD-10-CM

## 2024-03-24 DIAGNOSIS — L989 Disorder of the skin and subcutaneous tissue, unspecified: Secondary | ICD-10-CM | POA: Diagnosis not present

## 2024-03-24 DIAGNOSIS — Z7984 Long term (current) use of oral hypoglycemic drugs: Secondary | ICD-10-CM

## 2024-03-24 DIAGNOSIS — L089 Local infection of the skin and subcutaneous tissue, unspecified: Principal | ICD-10-CM | POA: Diagnosis present

## 2024-03-24 DIAGNOSIS — Z743 Need for continuous supervision: Secondary | ICD-10-CM | POA: Diagnosis not present

## 2024-03-24 DIAGNOSIS — Z833 Family history of diabetes mellitus: Secondary | ICD-10-CM

## 2024-03-24 DIAGNOSIS — J4489 Other specified chronic obstructive pulmonary disease: Secondary | ICD-10-CM | POA: Diagnosis present

## 2024-03-24 DIAGNOSIS — E1159 Type 2 diabetes mellitus with other circulatory complications: Secondary | ICD-10-CM | POA: Diagnosis not present

## 2024-03-24 DIAGNOSIS — Z8249 Family history of ischemic heart disease and other diseases of the circulatory system: Secondary | ICD-10-CM

## 2024-03-24 DIAGNOSIS — Z86718 Personal history of other venous thrombosis and embolism: Secondary | ICD-10-CM

## 2024-03-24 DIAGNOSIS — I10 Essential (primary) hypertension: Secondary | ICD-10-CM | POA: Diagnosis present

## 2024-03-24 DIAGNOSIS — E119 Type 2 diabetes mellitus without complications: Secondary | ICD-10-CM

## 2024-03-24 DIAGNOSIS — S91301A Unspecified open wound, right foot, initial encounter: Secondary | ICD-10-CM | POA: Diagnosis not present

## 2024-03-24 DIAGNOSIS — Z7985 Long-term (current) use of injectable non-insulin antidiabetic drugs: Secondary | ICD-10-CM

## 2024-03-24 DIAGNOSIS — M19071 Primary osteoarthritis, right ankle and foot: Secondary | ICD-10-CM | POA: Diagnosis not present

## 2024-03-24 LAB — CBC WITH DIFFERENTIAL/PLATELET
Abs Immature Granulocytes: 0.02 10*3/uL (ref 0.00–0.07)
Basophils Absolute: 0 10*3/uL (ref 0.0–0.1)
Basophils Relative: 0 %
Eosinophils Absolute: 0.1 10*3/uL (ref 0.0–0.5)
Eosinophils Relative: 1 %
HCT: 35.2 % — ABNORMAL LOW (ref 36.0–46.0)
Hemoglobin: 11.5 g/dL — ABNORMAL LOW (ref 12.0–15.0)
Immature Granulocytes: 0 %
Lymphocytes Relative: 26 %
Lymphs Abs: 2.4 10*3/uL (ref 0.7–4.0)
MCH: 30.5 pg (ref 26.0–34.0)
MCHC: 32.7 g/dL (ref 30.0–36.0)
MCV: 93.4 fL (ref 80.0–100.0)
Monocytes Absolute: 0.5 10*3/uL (ref 0.1–1.0)
Monocytes Relative: 6 %
Neutro Abs: 6.1 10*3/uL (ref 1.7–7.7)
Neutrophils Relative %: 67 %
Platelets: 221 10*3/uL (ref 150–400)
RBC: 3.77 MIL/uL — ABNORMAL LOW (ref 3.87–5.11)
RDW: 13.5 % (ref 11.5–15.5)
WBC: 9.2 10*3/uL (ref 4.0–10.5)
nRBC: 0 % (ref 0.0–0.2)

## 2024-03-24 LAB — BASIC METABOLIC PANEL WITH GFR
Anion gap: 7 (ref 5–15)
BUN: 7 mg/dL — ABNORMAL LOW (ref 8–23)
CO2: 28 mmol/L (ref 22–32)
Calcium: 8.8 mg/dL — ABNORMAL LOW (ref 8.9–10.3)
Chloride: 105 mmol/L (ref 98–111)
Creatinine, Ser: 0.82 mg/dL (ref 0.44–1.00)
GFR, Estimated: >60 mL/min (ref >60)
Glucose, Bld: 195 mg/dL — ABNORMAL HIGH (ref 70–99)
Potassium: 3.8 mmol/L (ref 3.5–5.1)
Sodium: 140 mmol/L (ref 135–145)

## 2024-03-24 LAB — I-STAT CG4 LACTIC ACID, ED: Lactic Acid, Venous: 1.1 mmol/L (ref 0.5–1.9)

## 2024-03-24 LAB — C-REACTIVE PROTEIN: CRP: 13.3 mg/dL — ABNORMAL HIGH (ref <1.0)

## 2024-03-24 LAB — GLUCOSE, CAPILLARY: Glucose-Capillary: 174 mg/dL — ABNORMAL HIGH (ref 70–99)

## 2024-03-24 LAB — SEDIMENTATION RATE: Sed Rate: 96 mm/h — ABNORMAL HIGH (ref 0–22)

## 2024-03-24 MED ORDER — AMLODIPINE BESYLATE 10 MG PO TABS
10.0000 mg | ORAL_TABLET | Freq: Every day | ORAL | Status: DC
  Administered 2024-03-25 – 2024-03-31 (×7): 10 mg via ORAL
  Filled 2024-03-24 (×7): qty 1

## 2024-03-24 MED ORDER — ACETAMINOPHEN 650 MG RE SUPP: 650.0000 mg | Freq: Four times a day (QID) | RECTAL | Status: DC | PRN

## 2024-03-24 MED ORDER — ENOXAPARIN SODIUM 40 MG/0.4ML IJ SOSY
40.0000 mg | PREFILLED_SYRINGE | INTRAMUSCULAR | Status: DC
  Administered 2024-03-24 – 2024-03-26 (×3): 40 mg via SUBCUTANEOUS
  Filled 2024-03-24 (×3): qty 0.4

## 2024-03-24 MED ORDER — SPIRONOLACTONE 25 MG PO TABS
50.0000 mg | ORAL_TABLET | Freq: Every day | ORAL | Status: DC
  Administered 2024-03-25 – 2024-03-31 (×6): 50 mg via ORAL
  Filled 2024-03-24 (×6): qty 2

## 2024-03-24 MED ORDER — GADOBUTROL 1 MMOL/ML IV SOLN
10.0000 mL | Freq: Once | INTRAVENOUS | Status: AC | PRN
  Administered 2024-03-24: 10 mL via INTRAVENOUS

## 2024-03-24 MED ORDER — PANTOPRAZOLE SODIUM 40 MG PO TBEC
40.0000 mg | DELAYED_RELEASE_TABLET | Freq: Every day | ORAL | Status: DC
  Administered 2024-03-25 – 2024-03-31 (×7): 40 mg via ORAL
  Filled 2024-03-24 (×7): qty 1

## 2024-03-24 MED ORDER — ROSUVASTATIN CALCIUM 20 MG PO TABS
20.0000 mg | ORAL_TABLET | Freq: Every day | ORAL | Status: DC
  Administered 2024-03-25 – 2024-03-31 (×7): 20 mg via ORAL
  Filled 2024-03-24 (×7): qty 1

## 2024-03-24 MED ORDER — OXYCODONE HCL 5 MG PO TABS
5.0000 mg | ORAL_TABLET | ORAL | Status: DC | PRN
  Administered 2024-03-24 – 2024-03-26 (×4): 5 mg via ORAL
  Filled 2024-03-24 (×4): qty 1

## 2024-03-24 MED ORDER — DULOXETINE HCL 60 MG PO CPEP
60.0000 mg | ORAL_CAPSULE | Freq: Every day | ORAL | Status: DC
  Administered 2024-03-25 – 2024-03-31 (×7): 60 mg via ORAL
  Filled 2024-03-24 (×7): qty 1

## 2024-03-24 MED ORDER — INSULIN GLARGINE-YFGN 100 UNIT/ML ~~LOC~~ SOLN
20.0000 [IU] | Freq: Every day | SUBCUTANEOUS | Status: DC
  Administered 2024-03-25 – 2024-03-27 (×3): 20 [IU] via SUBCUTANEOUS
  Filled 2024-03-24 (×4): qty 0.2

## 2024-03-24 MED ORDER — ASPIRIN 81 MG PO TBEC
81.0000 mg | DELAYED_RELEASE_TABLET | Freq: Every day | ORAL | Status: DC
  Administered 2024-03-25 – 2024-03-31 (×7): 81 mg via ORAL
  Filled 2024-03-24 (×7): qty 1

## 2024-03-24 MED ORDER — MIRABEGRON ER 50 MG PO TB24
50.0000 mg | ORAL_TABLET | Freq: Every day | ORAL | Status: DC
  Administered 2024-03-25 – 2024-03-31 (×7): 50 mg via ORAL
  Filled 2024-03-24 (×7): qty 1

## 2024-03-24 MED ORDER — ACETAMINOPHEN 325 MG PO TABS: 650.0000 mg | ORAL_TABLET | Freq: Four times a day (QID) | ORAL | Status: DC | PRN

## 2024-03-24 MED ORDER — METOPROLOL SUCCINATE ER 100 MG PO TB24
100.0000 mg | ORAL_TABLET | Freq: Every day | ORAL | Status: DC
  Administered 2024-03-25 – 2024-03-31 (×8): 100 mg via ORAL
  Filled 2024-03-24 (×2): qty 2
  Filled 2024-03-24 (×4): qty 1
  Filled 2024-03-24: qty 2

## 2024-03-24 MED ORDER — INSULIN ASPART 100 UNIT/ML IJ SOLN
0.0000 [IU] | Freq: Every day | INTRAMUSCULAR | Status: DC
  Administered 2024-03-27: 2 [IU] via SUBCUTANEOUS
  Administered 2024-03-29: 3 [IU] via SUBCUTANEOUS
  Administered 2024-03-29 – 2024-03-30 (×2): 2 [IU] via SUBCUTANEOUS

## 2024-03-24 MED ORDER — INSULIN ASPART 100 UNIT/ML IJ SOLN
0.0000 [IU] | Freq: Three times a day (TID) | INTRAMUSCULAR | Status: DC
  Administered 2024-03-25: 4 [IU] via SUBCUTANEOUS
  Administered 2024-03-25: 3 [IU] via SUBCUTANEOUS
  Administered 2024-03-25: 4 [IU] via SUBCUTANEOUS
  Administered 2024-03-26: 11 [IU] via SUBCUTANEOUS
  Administered 2024-03-26 – 2024-03-27 (×3): 7 [IU] via SUBCUTANEOUS
  Administered 2024-03-27: 15 [IU] via SUBCUTANEOUS
  Administered 2024-03-27: 4 [IU] via SUBCUTANEOUS
  Administered 2024-03-28 (×2): 7 [IU] via SUBCUTANEOUS
  Administered 2024-03-29 – 2024-03-30 (×3): 4 [IU] via SUBCUTANEOUS
  Administered 2024-03-30: 6 [IU] via SUBCUTANEOUS
  Administered 2024-03-30: 20 [IU] via SUBCUTANEOUS
  Administered 2024-03-31: 15 [IU] via SUBCUTANEOUS
  Administered 2024-03-31: 20 [IU] via SUBCUTANEOUS

## 2024-03-24 MED ORDER — UMECLIDINIUM-VILANTEROL 62.5-25 MCG/ACT IN AEPB
1.0000 | INHALATION_SPRAY | Freq: Every day | RESPIRATORY_TRACT | Status: DC
  Administered 2024-03-25 – 2024-03-31 (×6): 1 via RESPIRATORY_TRACT
  Filled 2024-03-24: qty 14

## 2024-03-24 MED ORDER — SENNOSIDES-DOCUSATE SODIUM 8.6-50 MG PO TABS
1.0000 | ORAL_TABLET | Freq: Every evening | ORAL | Status: DC | PRN
  Filled 2024-03-24 (×2): qty 1

## 2024-03-24 MED ORDER — PREGABALIN 100 MG PO CAPS
200.0000 mg | ORAL_CAPSULE | Freq: Three times a day (TID) | ORAL | Status: DC
  Administered 2024-03-24 – 2024-03-31 (×19): 200 mg via ORAL
  Filled 2024-03-24 (×19): qty 2

## 2024-03-24 NOTE — Progress Notes (Signed)
  Subjective:  [name], female    DOB: [birth year],  MRN: [id number]  Chief Complaint  Patient presents with   Blister    RM#12 Patient presents today with a blister on right foot and two open wounds on right leg.Patient states appeared at the same time about two weeks ago.No fevers but experiencing chills     Discussed the use of AI scribe software for clinical note transcription with the patient, who gave verbal consent to proceed.  History of Present Illness A 63 year old patient presents with a chief complaint of a worsening infection in her right leg. She was recently seen in the emergency room two days ago and started on doxycycline. Despite this treatment, the patient reports that the swelling in her right leg has drastically worsened and there has been drainage from the wound on the leg. Accompanying these symptoms, the patient has also experienced fevers and chills. She states this started after wearing compression socks.   She has a BKA on the left.       Objective:    Physical Exam GENERAL: Awake, alert, oriented x3, no acute distress. 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 Blisters noted on the leg.  Musculoskeletal: Pain to the right lower extremity.          Results     Assessment:   1. Cellulitis of right foot   2. 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  Assessment and Plan Assessment & Plan Cellulitis and ulceration of the right lower extremity Worsening infection with increased swelling, drainage, and necrotic tissue on the TMA stump. Systemic involvement suggested by fevers and chills. Insufficient response to oral doxycycline. Concerns for deeper infection or osteomyelitis. - Admit for broad  spectrum IV antibiotics. - Arrange non-emergent transport to [facility]. Contacted Jennifer [name] at [facility] to let them know of her arrival.  - Order MRI of the right leg upon admission.  - Consider CT of the lower extremity. - Podiatry to follow in hospital.   No follow-ups on file.   [name] DPM

## 2024-03-24 NOTE — H&P (Cosign Needed Addendum)
 Date: 03/24/2024               Patient Name:  Jennifer [name] MRN: [id number]  DOB: 06/30/1961 Age / Sex: 63 y.o., female   PCP: Miguel Aschoff, MD         Medical Service: Internal Medicine Teaching Service         Attending Physician: Dr. Ginnie Smart, MD      First Contact: Kathleen Lime, MD  Pager:  [phone]  Second Contact: Marrianne Mood, MD  Pager:  [phone]       After Hours  (After 5pm / First Contact Pager: [phone]  weekends / holidays): Second Contact Pager: [phone]   SUBJECTIVE   Chief Complaint: right wound infection   History of Present Illness: Jennifer [name] is a 63 y.o. 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   Presents with worsening right foot swelling and pain. States the lateral wound has had some drainage but no bleeding for the past week. Hx of right TMA in February 2024 by podiatry. Was recently evaluated in ED on 4/1 and was discharged with doxycycline. Patient states been taking as prescribed but no improvement. Endorses chills at home, did not check temp. Denies SOB, chest or abdominal pain. Denies calf tenderness. Seen by podiatry in clinic today. Concern for worsening wound infection and directed patient to ED.    ED Course: Vitals were hypertensive to SBP 150, afebrile and on room air.  Labs significant for no leukocytosis, CRP 13.3 and normal lactic acid.  Imaging: xray of right foot with similar cortical irregularity but no definite erosive changes, no fractures. Noted soft tissue swelling at stump.  Consulted podiatry.   Meds:  -albuterol PRN -amlodipine 10 -Aspirin 81  -Baclofen 5 BID PRN -Doxycycline 100 BID (4/1) -Cymbalta 60 mg -Tresiba 65 units  -Metformin 500 mg daily  -Robaxin 500 mg BID PRN -Reglan 5 mg TID -Metoprolol 100 mg daily  -Myrbetriq 50 mg -Oxycodone 5 mg every 4 hours  -Protonix 40 mg  -Lyrica 200 mg TID -Crestor 20 mg -Ozempic 1 mg  -Spironolactone 50 mg   -Anoro in AM No outpatient medications have been marked as taking for the 03/24/24 encounter [facility] Encounter).    Past Medical History Past Medical History:  Diagnosis Date   Acute vestibular syndrome, resolved 03/02/2021   Angioedema 06/14/2021   Asthma    Burn of finger of right hand, second degree 02/05/2021   Occurred during cooking (frying), poor sensation due to neuropathy, pt punctured blister to allow it to drain, skin has since desquamated over dorsal joint, no infection.  Keep clean and dry, OTC antibacterial ointment.   Cataract    CHF (congestive heart failure) (HCC)    Chronic bronchitis (HCC)    "I get it alot" (09/28/2013)   Chronic diastolic heart failure (HCC)    grade 2 per 2D echocardiogram (01/2013)   Chronic kidney disease    Chronic lower back pain    Chronic pain syndrome 12/03/2011   Likely secondary to depression, "fibromyalgia", neuropathy, and obesity. Lumbar MRI 2014 no sig change from prior (2008) : Stable hypertrophic facet disease most notable at L4-5. Stable shallow left foraminal/extraforaminal disc protrusion at L4-5. No direct neural compression.       Chronic right shoulder pain 10/10/2021   COPD 01/08/2007   PFT's 05/2007 : FEV1/FVC 82, FEV1 64% pred, FEF 25-75% 40% predicted, 16% improvement in FEV1 with bronchodilators.  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 Right brachial vein, diagnosed on 03/10/2013 Coumadin for 3 months. 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  S. Aureus and GBS.    History of use of hearing aid    Hyperlipidemia    Hyperplastic colon polyp 12/2010   Per colonoscopy (12/2010) - Dr. 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       Initially evaluated 08/2019 with dedicated MRI: "IMPRESSION:  Skin marker overlies a 9 x 10 x 11 mm cyst within the anterior  aspect of the superficial lobe of the right parotid gland. This is  presumed to be a benign cyst. The possibility of a cystic Warthin's  tumor   Pneumonia    PVD (peripheral vascular disease) with claudication (HCC)    Stents to bilateral common iliac arteries (left 2005, right 2008), on chronic plavix   Pyelonephritis 10/28/2020   S/P BKA (below knee amputation) unilateral (HCC)    2014 L - failed limb preserving treatment. 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  Evaluated on 07/08/2021 by podiatrist Dr. Logan Bores who debrided necrotic tissue and prescribed doxycycline.  He will see her again in 3 weeks.  The location of this ulcer on the dorsal aspect of the toe is somewhat atypical for a purely diabetic foot wound, and she does have a strong DP pulse.  I did not examine h   Type II diabetes mellitus with peripheral circulatory disorders, uncontrolled DX: 1993    Insulin dep. Poor control. Complicated by diabetic foot ulcer and diabetic eye disease.     Upper respiratory tract infection due to COVID-19 virus 07/22/2022   Urinary incontinence, severe, mixed (stress, urge, functional) 02/14/2023   Unresponsive to medications    Past Surgical History Past Surgical History:  Procedure Laterality Date   ABDOMINAL HYSTERECTOMY  1997   secondary to uterine fibroids   AMPUTATION Left 08/31/2013   Procedure: AMPUTATION RAY;  Surgeon: Nadara Mustard, MD;  Location: [facility] OR;  Service: Orthopedics;  Laterality: Left;  Left Foot 5th Ray Amputation   AMPUTATION Left 09/28/2013   Procedure: Left Midfoot amputation;  Surgeon: Nadara Mustard, MD;  Location: [facility] OR;  Service: Orthopedics;  Laterality: Left;  Left Midfoot amputation   AMPUTATION Left 10/14/2013   Procedure: AMPUTATION BELOW KNEE- left;  Surgeon: Nadara Mustard, MD;  Location: [facility] OR;  Service: Orthopedics;  Laterality: Left;  Left Below Knee Amputation    AMPUTATION TOE Right 01/15/2017   Procedure: AMPUTATION 5th TOE RIGHT FOOT;  Surgeon: Felecia Shelling, DPM;  Location: [facility] OR;  Service: Podiatry;  Laterality: Right;   AMPUTATION TOE Right 02/12/2023   Procedure: Right Foot Transmetatarsal Amputation;  Surgeon: [name], DPM;  Location: [facility] OR;  Service: Podiatry;  Laterality: Right;   APPLICATION OF WOUND VAC  04/01/2019   Procedure: Application Of Wound Vac;  Surgeon: Nadara Mustard, MD;  Location: [facility] OR;  Service: Orthopedics;;   BLADDER SURGERY     bladder reconstruction surgery   BOTOX INJECTION N/A 08/21/2021   Procedure: CYSTOSCOPY BOTOX INJECTION;  Surgeon: Crista Elliot, MD;  Location: [facility] ORS;  Service: Urology;  Laterality: N/A;   BREAST BIOPSY     multiple-benign per pt   CATARACT EXTRACTION, BILATERAL     summer 2022   COLONOSCOPY     DILATION AND CURETTAGE OF UTERUS  1985   ESOPHAGOGASTRODUODENOSCOPY N/A 09/20/2013   Procedure: ESOPHAGOGASTRODUODENOSCOPY (EGD);  Surgeon: Beverley Fiedler, MD;  Location[facility] ENDOSCOPY;  Service: Gastroenterology;  Laterality: N/A;   EYE SURGERY Bilateral 2022   Cataract removal in June and then July   FOOT AMPUTATION THROUGH METATARSAL Left 09/28/2013   GANGLION CYST EXCISION     multiple   PAROTIDECTOMY Right 05/14/2022   Procedure: PAROTIDECTOMY;  Surgeon: Christia Reading, MD;  Location: [facility] OR;  Service: ENT;  Laterality: Right;   PERIPHERAL VASCULAR INTERVENTION     stents in lower ext   PREAURICULAR CYST EXCISION N/A 05/14/2022   Procedure: EXCISION OF SCALP SKIN CYST, 1.5cm;  Surgeon: Christia Reading, MD;  Location: [facility] OR;  Service: ENT;  Laterality: N/A;   SHOULDER ARTHROSCOPY Right 11/11/2019   RIGHT SHOULDER ARTHROSCOPY AND DEBRIDEMENT    SHOULDER ARTHROSCOPY Right 11/11/2019   Procedure: RIGHT SHOULDER ARTHROSCOPY AND DEBRIDEMENT;  Surgeon: Nadara Mustard, MD;  Location: [facility] OR;  Service: Orthopedics;  Laterality: Right;   SHOULDER ARTHROSCOPY W/ ROTATOR CUFF REPAIR Bilateral    2 on right one on left   SKIN SPLIT GRAFT Bilateral 05/13/2013   Procedure: Right and Left Foot Allograft Skin Graft;  Surgeon: Nadara Mustard, MD;  Location: [facility] OR;  Service: Orthopedics;  Laterality: Bilateral;  Right and Left Foot Allograft Skin Graft   STUMP REVISION Left 04/01/2019   Procedure: REVISION LEFT BELOW KNEE AMPUTATION;  Surgeon: Nadara Mustard, MD;  Location: [facility] OR;  Service: Orthopedics;  Laterality: Left;   TEE WITHOUT CARDIOVERSION N/A 01/31/2013   Procedure: TRANSESOPHAGEAL ECHOCARDIOGRAM (TEE);  Surgeon: Pricilla Riffle, MD;  Location: [facility] ENDOSCOPY;  Service: Cardiovascular;  Laterality: N/A;  Rm 3W25   TEE WITHOUT CARDIOVERSION N/A 03/10/2013   Procedure: TRANSESOPHAGEAL ECHOCARDIOGRAM (TEE);  Surgeon: Laurey Morale, MD;  Location: [facility] ENDOSCOPY;  Service: Cardiovascular;  Laterality: N/A;  Rm. [id number]   TOE AMPUTATION Left 08/31/2013   4TH & 5 TH TOE    TONSILLECTOMY     TUBAL LIGATION     WRIST SURGERY Right    "for tumors"  (09/28/2013)    Social:  Lives With: son Support: family Level of Function: independent with ADL, has walker  PCP: Miguel Aschoff, MD \\Substances : -Tobacco: 1 ppd x 50 years -Alcohol:  denies -Recreational Drug: denies  Family History:  Family History  Problem Relation Age of Onset   Diverticulosis Mother    Diabetes Mother    Hypertension Mother    Congestive Heart Failure Mother    Asthma Father    CAD Sister 20       MI at age 65 per patient.  However, she has not had a stent or CABG.    Heart disease Sister        before age 48   Colon cancer Brother    Breast cancer Neg Hx    Colon polyps Neg Hx    Rectal cancer Neg Hx    Stomach cancer Neg Hx    Esophageal cancer Neg Hx     Allergies: Allergies as of 03/24/2024 - Review Complete 03/24/2024  Allergen Reaction Noted   Benazepril Swelling 06/14/2021   Chantix [varenicline] Other (See Comments) 05/05/2022   Ioversol  02/10/2023   Morphine sulfate  02/10/2023   Pineapple Swelling 08/13/2021   Abilify [aripiprazole] Other (See Comments) 11/09/2015   Iohexol Other (See Comments) 08/17/2007   Ivp dye [iodinated contrast media] Other (See Comments) 05/10/2013    Review of Systems: A complete ROS was negative except as per HPI.   OBJECTIVE:   Physical Exam: Blood pressure (!) 160/64, pulse 70, temperature 98.7 F (37.1 C), temperature source Oral, resp. rate 20, height 5\' 5"  (1.651 m), weight 99.8 kg, SpO2 94%.  Constitutional: alert, laying in bed, in no acute distress HENT: normocephalic atraumatic, mucous membranes moist Cardiovascular: regular rate and rhythm Pulmonary/Chest: normal work of breathing on room air, lungs clear to auscultation bilaterally Abdominal: bowel sounds present, soft, non-tender, non-distended Neurological: alert & oriented x 3 Skin: see photos below, minimal drainage of lateral wound of right TMA stump, open wounds with surrounding erythema of RLE    Labs: CBC    Component  Value Date/Time   WBC 9.2 03/24/2024 1530   RBC 3.77 (L) 03/24/2024 1530   HGB 11.5 (L) 03/24/2024 1530   HGB 12.2 09/01/2022 1000   HCT 35.2 (L) 03/24/2024 1530   HCT 38.5 09/01/2022 1000   PLT 221 03/24/2024 1530   PLT 175 09/01/2022 1000   MCV 93.4 03/24/2024 1530   MCV 93 09/01/2022 1000   MCH 30.5 03/24/2024 1530   MCHC 32.7 03/24/2024 1530   RDW 13.5 03/24/2024 1530   RDW 14.4 09/01/2022 1000   LYMPHSABS 2.4 03/24/2024 1530   LYMPHSABS 2.7 09/01/2022 1000   MONOABS 0.5 03/24/2024 1530   EOSABS 0.1 03/24/2024 1530   EOSABS 0.1 09/01/2022 1000   BASOSABS 0.0 03/24/2024 1530   BASOSABS 0.1 09/01/2022 1000     CMP     Component Value Date/Time   NA 140 03/24/2024 1530   NA 141 02/18/2024 0932   K 3.8 03/24/2024 1530   CL 105 03/24/2024 1530   CO2 28 03/24/2024 1530   GLUCOSE 195 (H) 03/24/2024 1530   BUN 7 (L) 03/24/2024 1530   BUN 10 02/18/2024 0932   CREATININE 0.82 03/24/2024 1530   CREATININE 0.68 01/31/2015 1641   CALCIUM 8.8 (L) 03/24/2024 1530   PROT 6.6 03/15/2024 1000   PROT 6.8 05/26/2023 1017   ALBUMIN 2.9 (L) 03/15/2024 1000   ALBUMIN 3.8 (L) 05/26/2023 1017   AST 15 03/15/2024 1000   ALT 14 03/15/2024 1000   ALKPHOS 75 03/15/2024 1000   BILITOT 0.7 03/15/2024 1000   BILITOT <0.2 05/26/2023 1017   GFRNONAA >60 03/24/2024 1530  GFRNONAA >89 01/31/2015 1641   GFRAA 54 (L) 10/25/2020 1131   GFRAA >89 01/31/2015 1641    Imaging:  DG Foot Complete Right CLINICAL DATA:  Pain.  History of transmetatarsal amputation.  EXAM: RIGHT FOOT COMPLETE - 3+ VIEW  COMPARISON:  03/22/2024.  FINDINGS: Status post transmetatarsal amputation. Transmetatarsal transsection margins demonstrates similar cortical indistinctness/irregularity, predominantly involving the second through fifth metatarsals. No definite new osteolysis or erosive changes. No acute fracture or dislocation. Calcaneal enthesopathy. Diffuse soft tissue swelling, most pronounced at the  transsection stump.  IMPRESSION: 1. Status post transmetatarsal amputation. Transmetatarsal transsection margins demonstrates similar cortical indistinctness/irregularity. No definite new osteolysis or erosive changes. No acute fracture or dislocation. 2. Diffuse soft tissue swelling, most pronounced at the transsection stump.  Electronically Signed   By: Hart Robinsons M.D.   On: 03/24/2024 16:06   EKG: not obtained in ED  ASSESSMENT & PLAN:   Assessment & Plan by Problem: Principal Problem:   Right foot infection   Jennifer [name] is a 63 y.o. 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 Afebrile and no leukocytosis, otherwise stable. Normal lactic acid. Still concern for deeper infection with uncontrolled diabetes and worsening wound with drainage No definitive signs of osteomyelitis on xray but will need MRI for better evaluation. ESR elevated. Hx of PAD with past ABI noted moderate RLE arterial disease with 0.79.  -f/u R foot MRI -repeat vasc ABIs of RLE, consider need for VVS -appreciate podiatry assistance, f/u recs  -hold off abx for now pending possible procedure, start abx if decline/unstable -trend temp curve and CBC -f/u blood cx collected in ED -f/u ESR  -tylenol PRN and home oxycodone PRN (bowel regimen in place) -consult WOC  T2DM with hyperglycemia, uncontrolled A1c 9.2 in February. PTA on Ozempic, Tresiba 65 units and metformin.  -CBG monitoring -SSI-R ACHS -Restart home long acting insulin at reduced dose  -Hold home Ozempic and metformin   HTN Hypertensive on arrival to SBP 160-170.  -Restart home amlodipine,  metoprolol and spironolactone  HLD PAD -Continue home ASA and rosuvastatin  -Repeat RLE ABIs  Chronic back pain: continue home Lyrica and oxycodone 5 mg Q4H PRN COPD/Tobacco use: continue home Anoro inhaler Depression: continue home duloxetine  GERD: continue home PPI Hx of CKD2: Renal function stable. Continue to monitor.  Urinary incontinence: no acute change, continue home Myrbetriq    Diet:  HH/CM VTE: Enoxaparin IVF: None, Code: Full Surrogate Decision Maker: son Lissa Morales)  Prior to Admission Living Arrangement: Home, living with son Anticipated Discharge Location: Home Barriers to Discharge: wound infection   Dispo: Admit patient to Inpatient with expected length of stay greater than 2 midnights.  Signed: Rana Snare, DO Internal Medicine Resident PGY-2 03/24/2024, 9:12 PM   Please contact IM Residency On-Call Pager at: [phone] or [phone].

## 2024-03-24 NOTE — ED Triage Notes (Signed)
 Pt BIB EMS from foot and ankle clinic for c/o possible infection to right foot. Pt has woulnd that is being treated. PCP stated that pt needed to come to ED to be evaluated. Pt is diabetic. Bp 150/70 p 74 rr20 spo2 92 cbg 247

## 2024-03-24 NOTE — ED Provider Notes (Signed)
 Patient with a history of PAD, diabetes, amputation comes in with chief complaint of worsening wound from podiatry. Wound as seen in the picture below.  Clinical concerns for cellulitis, gangrene, necrotizing infection, osteomyelitis.  I think at the minimum patient needs admission for debridement of the wound.  Clinically she is not septic right now, we will not proceed with antibiotics at this time.  We will send inflammatory markers, to see if patient needs MRI.        Derwood Kaplan, MD 03/24/24 [phone]

## 2024-03-24 NOTE — Hospital Course (Addendum)
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  Right LE wounds  Hx of right TMA (01/2023) Presented after failing outpatient abx for lateral wound at TMA stump and seen by podiatry office and found to have  osteomyelitis of the remnant fifth metatarsal shaft.  Podiatry was consulted who recommended getting ABIs and debridement.

## 2024-03-24 NOTE — ED Provider Notes (Signed)
 [place] EMERGENCY DEPARTMENT AT [facility] Provider Note   CSN: [id number] Arrival date & time: 03/24/24  1427     History  Chief Complaint  Patient presents with   Wound Infection    Jennifer [name] is a 63 y.o. 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  HPI     Home Medications Prior to Admission medications   Medication Sig Start Date End Date Taking? Authorizing Provider  acetaminophen (TYLENOL) 650 MG CR tablet Take 2 tablets (1,300 mg total) by mouth every 8 (eight) hours as needed for pain. Patient taking differently: Take 1,950 mg by mouth daily as needed for pain. 02/16/23  Yes Mapp, Tavien, MD  albuterol (PROAIR HFA) 108 (90 Base) MCG/ACT inhaler INHALE 2 PUFFS BY MOUTH EVERY 6 HOURS AS NEEDED FOR WHEEZING 11/04/23  Yes Miguel Aschoff, MD  amLODipine (NORVASC) 10 MG tablet Take 1 tablet (10 mg total) by mouth daily. 08/06/23  Yes Miguel Aschoff, MD  Baclofen 5 MG TABS Take 1 tablet (5 mg total) by mouth 2 (two) times daily as needed (muscle spasms). 02/18/24  Yes Miguel Aschoff, MD  doxycycline (VIBRAMYCIN) 100 MG capsule Take 1 capsule (100 mg total) by mouth 2 (two) times daily. 03/22/24  Yes Plunkett, Alphonzo Lemmings, MD  DULoxetine (CYMBALTA) 60 MG capsule Take 1 capsule (60 mg total) by mouth daily. 02/18/24  Yes Miguel Aschoff, MD  insulin degludec (TRESIBA FLEXTOUCH) 200 UNIT/ML FlexTouch Pen Inject 65 Units into the skin daily. 02/18/24  Yes Miguel Aschoff, MD  lidocaine (LIDODERM) 5 % Place 1 patch onto the skin daily. Remove & Discard patch within 12 hours or as directed by MD  03/15/24  Yes Gwyneth Sprout, MD  metFORMIN (GLUCOPHAGE-XR) 500 MG 24 hr tablet Take 1 tablet (500 mg total) by mouth daily with breakfast. 08/06/23  Yes Miguel Aschoff, MD  methocarbamol (ROBAXIN) 500 MG tablet Take 1 tablet (500 mg total) by mouth 2 (two) times daily. Patient taking differently: Take 500 mg by mouth 3 (three) times daily as needed for muscle spasms. 03/15/24  Yes Gwyneth Sprout, MD  metoCLOPramide (REGLAN) 5 MG tablet Take 1 tablet (5 mg total) by mouth 3 (three) times daily before meals. 02/18/24  Yes Miguel Aschoff, MD  metoprolol succinate (TOPROL-XL) 100 MG 24 hr tablet Take 1 tablet (100 mg total) by mouth daily. TAKE WITH OR IMMEDIATELY FOLLOWING A MEAL. 02/18/24  Yes Miguel Aschoff, MD  MYRBETRIQ 50 MG TB24 tablet Take 1 tablet (50 mg total) by mouth daily. 02/18/24  Yes Miguel Aschoff, MD  ondansetron (ZOFRAN-ODT) 4 MG disintegrating tablet Take 1 tablet (4 mg total) by mouth every 8 (eight) hours as needed for nausea or vomiting. Patient taking differently: Take 4 mg by mouth daily as needed for nausea or vomiting. 02/18/24  Yes Miguel Aschoff, MD  oxyCODONE (OXY IR/ROXICODONE) 5 MG immediate release tablet Take 1 tablet (5 mg total) by mouth every 4 (four) hours as needed for severe pain (pain score 7-10). Patient taking differently: Take 5 mg by mouth 3 (three) times daily as needed for severe pain (pain score 7-10)  or moderate pain (pain score 4-6). 03/10/24  Yes Miguel Aschoff, MD  pantoprazole (PROTONIX) 40 MG tablet Take 1 tablet (40 mg total) by mouth daily. 07/14/23  Yes Miguel Aschoff, MD  pregabalin (LYRICA) 200 MG capsule Take 1 capsule (200 mg total) by mouth 3 (three) times daily. 03/10/24  Yes Miguel Aschoff, MD  rosuvastatin (CRESTOR) 20 MG tablet Take 1 tablet (20 mg total) by mouth in the morning. 02/18/24  Yes Miguel Aschoff, MD  Semaglutide, 1 MG/DOSE, (OZEMPIC, 1 MG/DOSE,) 4 MG/3ML SOPN Inject 1 mg into the  skin once a week. 08/06/23  Yes Miguel Aschoff, MD  silver sulfADIAZINE (SILVADENE) 1 % cream Apply 1 Application topically daily. To the wounds on your leg and foot 03/22/24  Yes Gwyneth Sprout, MD  spironolactone (ALDACTONE) 50 MG tablet Take 1 tablet (50 mg total) by mouth daily. Patient taking differently: Take 50 mg by mouth 2 (two) times daily. 02/18/24  Yes Miguel Aschoff, MD  traZODone (DESYREL) 100 MG tablet Take 200 mg by mouth at bedtime as needed for sleep.   Yes [provider]  umeclidinium-vilanterol (ANORO ELLIPTA) 62.5-25 MCG/ACT AEPB Inhale 1 puff into the lungs in the morning. 08/06/23  Yes Miguel Aschoff, MD  Accu-Chek Softclix Lancets lancets Use as back up to CGM sensors up to 1 time a day 11/17/23   Miguel Aschoff, MD  aspirin EC 81 MG tablet Take 1 tablet (81 mg total) by mouth daily. Patient not taking: Reported on 03/25/2024 09/19/21   Miguel Aschoff, MD  Blood Glucose Monitoring Suppl (ACCU-CHEK GUIDE) w/Device KIT Use to check blood sugar up to 1 times a day 11/17/23   Miguel Aschoff, MD  Continuous Glucose Sensor (DEXCOM G7 SENSOR) MISC Place new sensor every 10 days. Use to monitor blood sugar continuously. 11/17/23   Miguel Aschoff, MD  glucose blood (ACCU-CHEK GUIDE TEST) test strip Use as back up to CGM sensors up to 1 time a day 11/17/23   Miguel Aschoff, MD  Insulin Pen Needle 32G X 4 MM MISC Use to inject insulin 4 (four) times daily. 02/18/24   Miguel Aschoff, MD  nystatin cream (MYCOSTATIN) Apply 1 Application topically 2 (two) times daily. Patient not taking: Reported on 03/25/2024 10/20/23   Miguel Aschoff, MD  nystatin powder Apply 1 Application topically 3 (three) times daily. Patient not taking: Reported on 03/25/2024 10/20/23   Miguel Aschoff, MD  oxybutynin (DITROPAN-XL) 10 MG 24 hr tablet Take 10 mg by mouth at bedtime. Patient not taking: Reported on 03/25/2024    [provider]   Potassium Chloride ER 20 MEQ TBCR Take 40 mEq by mouth daily. 01/02/14 01/23/14  Belia Heman, MD      Allergies    Benazepril, Chantix [varenicline], Ioversol, Morphine sulfate, Pineapple, Abilify [aripiprazole], Iohexol, and Ivp dye [iodinated contrast media]    Review of Systems   Review of Systems  Physical Exam Updated Vital Signs BP (!) 155/64 (BP Location: Right Arm)   Pulse 71   Temp 98.4 F (36.9 C) (Oral)   Resp 18   Ht 5\' 5"  (1.651 m)   Wt 99.8 kg   SpO2 92%   BMI 36.61 kg/m  Physical Exam Vitals and nursing note reviewed.  Constitutional:      General: She is not in acute distress.    Appearance: She is ill-appearing.  Musculoskeletal:     Right Lower Extremity: (All Toes)  Left Lower Extremity: Left leg is amputated below knee.  Feet:     Right foot:     Skin integrity: Ulcer present.  Neurological:     Mental Status: She is alert.  Psychiatric:        Behavior: Behavior is cooperative.      ED Results / Procedures / Treatments   Labs (all labs ordered are listed, but only abnormal results are displayed) Labs Reviewed  BASIC METABOLIC PANEL WITH GFR - Abnormal; Notable for the following components:      Result Value   Glucose, Bld 195 (*)    BUN 7 (*)    Calcium 8.8 (*)    All other components within normal limits  CBC WITH DIFFERENTIAL/PLATELET - Abnormal; Notable for the following components:   RBC 3.77 (*)    Hemoglobin 11.5 (*)    HCT 35.2 (*)    All other components within normal limits  SEDIMENTATION RATE - Abnormal; Notable for the following components:   Sed Rate 96 (*)    All other components within normal limits  C-REACTIVE PROTEIN - Abnormal; Notable for the following components:   CRP 13.3 (*)    All other components within normal limits  BASIC METABOLIC PANEL WITH GFR - Abnormal; Notable for the following components:   Glucose, Bld 171 (*)    BUN 6 (*)    Calcium 8.7 (*)    All other components within normal limits  CBC -  Abnormal; Notable for the following components:   RBC 3.76 (*)    Hemoglobin 11.5 (*)    HCT 34.5 (*)    All other components within normal limits  GLUCOSE, CAPILLARY - Abnormal; Notable for the following components:   Glucose-Capillary 174 (*)    All other components within normal limits  GLUCOSE, CAPILLARY - Abnormal; Notable for the following components:   Glucose-Capillary 174 (*)    All other components within normal limits  GLUCOSE, CAPILLARY - Abnormal; Notable for the following components:   Glucose-Capillary 148 (*)    All other components within normal limits  CULTURE, BLOOD (ROUTINE X 2)  CULTURE, BLOOD (ROUTINE X 2)  SURGICAL PCR SCREEN  HIV ANTIBODY (ROUTINE TESTING W REFLEX)  I-STAT CG4 LACTIC ACID, ED  I-STAT CG4 LACTIC ACID, ED    EKG None  Radiology  DG Foot Complete Right Result Date: 03/24/2024 CLINICAL DATA:  Pain.  History of transmetatarsal amputation. EXAM: RIGHT FOOT COMPLETE - 3+ VIEW COMPARISON:  03/22/2024. FINDINGS: Status post transmetatarsal amputation. Transmetatarsal transsection margins demonstrates similar cortical indistinctness/irregularity, predominantly involving the second through fifth metatarsals. No definite new osteolysis or erosive changes. No acute fracture or dislocation. Calcaneal enthesopathy. Diffuse soft tissue swelling, most pronounced at the transsection stump. IMPRESSION: 1. Status post transmetatarsal amputation. Transmetatarsal transsection margins demonstrates similar cortical indistinctness/irregularity. No definite new osteolysis or erosive changes. No acute fracture or dislocation. 2. Diffuse soft tissue swelling, most pronounced at the transsection stump. Electronically Signed   By: Hart Robinsons M.D.   On: 03/24/2024 16:06    Procedures Procedures   Medications Ordered in ED Medications  enoxaparin (LOVENOX) injection 40 mg (40 mg Subcutaneous Given 03/24/24 2018)  acetaminophen (TYLENOL) tablet 650 mg (has no  administration in time range)    Or  acetaminophen (TYLENOL) suppository 650 mg (has no administration in time range)  senna-docusate (Senokot-S) tablet 1 tablet (has no administration in time range)  insulin aspart (novoLOG) injection 0-20 Units (3 Units Subcutaneous Given 03/25/24 1215)  insulin aspart (  novoLOG) injection 0-5 Units ( Subcutaneous Not Given 03/24/24 2248)  umeclidinium-vilanterol (ANORO ELLIPTA) 62.5-25 MCG/ACT 1 puff (1 puff Inhalation Given by Other 03/25/24 0836)  rosuvastatin (CRESTOR) tablet 20 mg (20 mg Oral Given 03/25/24 0829)  pantoprazole (PROTONIX) EC tablet 40 mg (40 mg Oral Given 03/25/24 0829)  aspirin EC tablet 81 mg (81 mg Oral Given 03/25/24 0829)  amLODipine (NORVASC) tablet 10 mg (10 mg Oral Given 03/25/24 0827)  DULoxetine (CYMBALTA) DR capsule 60 mg (60 mg Oral Given 03/25/24 0829)  metoprolol succinate (TOPROL-XL) 24 hr tablet 100 mg (100 mg Oral Given 03/25/24 0826)  mirabegron ER (MYRBETRIQ) tablet 50 mg (50 mg Oral Given 03/25/24 0827)  pregabalin (LYRICA) capsule 200 mg (200 mg Oral Given 03/25/24 0827)  spironolactone (ALDACTONE) tablet 50 mg (50 mg Oral Given 03/25/24 0827)  oxyCODONE (Oxy IR/ROXICODONE) immediate release tablet 5 mg (5 mg Oral Given 03/24/24 2247)  insulin glargine-yfgn (SEMGLEE) injection 20 Units (20 Units Subcutaneous Given 03/25/24 0839)  gadobutrol (GADAVIST) 1 MMOL/ML injection 10 mL (10 mLs Intravenous Contrast Given 03/24/24 2107)    ED Course/ Medical Decision Making/ A&P                                Medical Decision Making Amount and/or Complexity of Data Reviewed Labs: ordered. Radiology: ordered.  Risk Decision regarding hospitalization.   63 y.o. 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  Differential diagnosis includes cellulitis, osteomyelitis, necrotizing fasciitis, sepsis.  Patient is afebrile here with no leukocytosis.  She is also not tachycardic, tachypneic, nor hypotensive.  Thus low suspicion for sepsis at this time.  X-ray of right foot shows transmetatarsal amputation without new osteomyelitis.  Blood cultures have been drawn, however do not feel need for antibiotic initiation at this time as patient's vital signs and labs are reassuring.  Will await culture data for determination if antibiotics are necessary.  ESR and CRP, and blood cultures ordered for further evaluation.  Patient is felt to require admission for management of wound with worsening drainage and fevers at home.  Internal medicine teaching service was contacted for admission.  I discussed the patient with Dr. Chipper Herb who agreed with admission of the patient.  No further emergent intervention required while patient was in the ED prior to transfer to the inpatient service.  Final Clinical Impression(s) / ED Diagnoses Final diagnoses:  Wound infection    Rx / DC Orders ED Discharge Orders     None      Renella Cunas, PGY-2 Emergency Medicine   Renella Cunas, MD 03/25/24 1332    Derwood Kaplan, MD 03/25/24 [phone]

## 2024-03-24 NOTE — Progress Notes (Signed)
 Patient received from ED after MRI, alert and oriented X 4, v/s stable (BP 157/78), with open wounds @ right leg, dressing done using xeroform and mepilex, complaining of pain 10/10. Patient able to move around with her prosthesis on in left leg  X1 assist. Patient settled down comfortably on the bed, call bell within the reach. Will continue to monitor the patient.

## 2024-03-24 NOTE — ED Notes (Signed)
 Pt to MRI then admitted bed  [address] taking up pt's roller chair to admit room.

## 2024-03-25 ENCOUNTER — Encounter (HOSPITAL_COMMUNITY)

## 2024-03-25 DIAGNOSIS — I10 Essential (primary) hypertension: Secondary | ICD-10-CM | POA: Diagnosis not present

## 2024-03-25 DIAGNOSIS — M86271 Subacute osteomyelitis, right ankle and foot: Secondary | ICD-10-CM

## 2024-03-25 DIAGNOSIS — L089 Local infection of the skin and subcutaneous tissue, unspecified: Secondary | ICD-10-CM | POA: Diagnosis not present

## 2024-03-25 DIAGNOSIS — E1159 Type 2 diabetes mellitus with other circulatory complications: Secondary | ICD-10-CM

## 2024-03-25 DIAGNOSIS — Z794 Long term (current) use of insulin: Secondary | ICD-10-CM | POA: Diagnosis not present

## 2024-03-25 HISTORY — DX: Subacute osteomyelitis, right ankle and foot: M86.271

## 2024-03-25 LAB — CBC
HCT: 34.5 % — ABNORMAL LOW (ref 36.0–46.0)
Hemoglobin: 11.5 g/dL — ABNORMAL LOW (ref 12.0–15.0)
MCH: 30.6 pg (ref 26.0–34.0)
MCHC: 33.3 g/dL (ref 30.0–36.0)
MCV: 91.8 fL (ref 80.0–100.0)
Platelets: 214 10*3/uL (ref 150–400)
RBC: 3.76 MIL/uL — ABNORMAL LOW (ref 3.87–5.11)
RDW: 13.5 % (ref 11.5–15.5)
WBC: 7.4 10*3/uL (ref 4.0–10.5)
nRBC: 0 % (ref 0.0–0.2)

## 2024-03-25 LAB — BASIC METABOLIC PANEL WITH GFR
Anion gap: 8 (ref 5–15)
BUN: 6 mg/dL — ABNORMAL LOW (ref 8–23)
CO2: 28 mmol/L (ref 22–32)
Calcium: 8.7 mg/dL — ABNORMAL LOW (ref 8.9–10.3)
Chloride: 103 mmol/L (ref 98–111)
Creatinine, Ser: 0.84 mg/dL (ref 0.44–1.00)
GFR, Estimated: >60 mL/min (ref >60)
Glucose, Bld: 171 mg/dL — ABNORMAL HIGH (ref 70–99)
Potassium: 3.6 mmol/L (ref 3.5–5.1)
Sodium: 139 mmol/L (ref 135–145)

## 2024-03-25 LAB — HIV ANTIBODY (ROUTINE TESTING W REFLEX): HIV Screen 4th Generation wRfx: NONREACTIVE

## 2024-03-25 LAB — GLUCOSE, CAPILLARY
Glucose-Capillary: 174 mg/dL — ABNORMAL HIGH (ref 70–99)
Glucose-Capillary: 148 mg/dL — ABNORMAL HIGH (ref 70–99)
Glucose-Capillary: 171 mg/dL — ABNORMAL HIGH (ref 70–99)
Glucose-Capillary: 182 mg/dL — ABNORMAL HIGH (ref 70–99)

## 2024-03-25 LAB — SURGICAL PCR SCREEN
MRSA, PCR: NEGATIVE
Staphylococcus aureus: NEGATIVE

## 2024-03-25 NOTE — Consult Note (Signed)
 PODIATRY CONSULTATION  NAME Jennifer [name] MRN [id number] DOB [birth year] DOA 03/24/2024   Reason for consult:  Chief Complaint  Patient presents with   Wound Infection    Attending/Consulting physician:  Jolayne Haines MD  History of present illness: "63 y.o. 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    Presents with worsening right foot swelling and pain. States the lateral wound has had some drainage but no bleeding for the past week. Hx of right TMA in February 2024 by podiatry. Was recently evaluated in ED on 4/1 and was discharged with doxycycline. Patient states been taking as prescribed but no improvement. Endorses chills at home, did not check temp. Denies SOB, chest or abdominal pain. Denies calf tenderness. Seen by podiatry in clinic today. Concern for worsening wound infection and directed patient to ED.  "  Patient has been followed by Dr. Loreta Ave in the outpatient setting.  He is concerned about worsening of the ulceration with increasing infection despite her being on antibiotics.  He reports that she did have delayed healing with her right foot transmetatarsal amputation last year.  She is aware of the plan to wait for the vascular studies to determine if vascular intervention is needed prior to our intervention discussed  surgery that will be involved regarding the right foot.   Past Medical History:  Diagnosis Date   Acute vestibular syndrome, resolved 03/02/2021   Angioedema 06/14/2021   Asthma    Burn of finger of right hand, second degree 02/05/2021   Occurred during cooking (frying), poor sensation due to neuropathy, pt punctured blister to allow it to drain, skin has since desquamated over dorsal joint, no infection.  Keep clean and dry, OTC antibacterial ointment.   Cataract    CHF (congestive heart failure) (HCC)    Chronic bronchitis (HCC)    "I get it alot" (09/28/2013)   Chronic diastolic heart failure (HCC)    grade 2 per  2D echocardiogram (01/2013)   Chronic kidney disease    Chronic lower back pain    Chronic pain syndrome 12/03/2011   Likely secondary to depression, "fibromyalgia", neuropathy, and obesity. Lumbar MRI 2014 no sig change from prior (2008) : Stable hypertrophic facet disease most notable at L4-5. Stable shallow left foraminal/extraforaminal disc protrusion at L4-5. No direct neural compression.       Chronic right shoulder pain 10/10/2021   COPD 01/08/2007   PFT's 05/2007 : FEV1/FVC 82, FEV1 64% pred, FEF 25-75% 40% predicted, 16% improvement in FEV1 with bronchodilators.      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 Right brachial vein, diagnosed on 03/10/2013 Coumadin for 3 months. 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  S. Aureus and GBS.    History of use of hearing aid    Hyperlipidemia    Hyperplastic colon polyp 12/2010   Per colonoscopy (12/2010) - Dr. 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       Initially evaluated  08/2019 with dedicated MRI: "IMPRESSION:  Skin marker overlies a 9 x 10 x 11 mm cyst within the anterior  aspect of the superficial lobe of the right parotid gland. This is  presumed to be a benign cyst. The possibility of a cystic Warthin's  tumor   Pneumonia    PVD (peripheral vascular disease) with claudication (HCC)    Stents to bilateral common iliac arteries (left 2005, right 2008), on chronic plavix   Pyelonephritis 10/28/2020   S/P BKA (below knee amputation) unilateral (HCC)    2014 L - failed limb preserving treatment. 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  Evaluated on 07/08/2021 by podiatrist Dr. Logan Bores who debrided necrotic tissue and prescribed doxycycline.  He will see her again in 3 weeks.  The location of this ulcer on the dorsal aspect of the toe is somewhat atypical for a purely diabetic foot wound, and she does have a strong DP pulse.  I did not examine h   Type II diabetes mellitus with peripheral circulatory disorders, uncontrolled DX: 1993   Insulin dep. Poor control. Complicated by diabetic foot ulcer and diabetic eye disease.     Upper respiratory tract infection due to COVID-19 virus 07/22/2022   Urinary incontinence, severe, mixed (stress, urge, functional) 02/14/2023   Unresponsive to medications       Latest Ref Rng & Units 03/25/2024    5:47 AM 03/24/2024    3:30 PM 03/22/2024   11:43 AM  CBC  WBC 4.0 - 10.5 K/uL 7.4  9.2  11.5   Hemoglobin 12.0 - 15.0 g/dL 95.6  21.3  08.6   Hematocrit 36.0 - 46.0 % 34.5  35.2  38.3   Platelets 150 - 400 K/uL 214  221  171        Latest Ref Rng & Units 03/25/2024    5:47 AM 03/24/2024    3:30 PM 03/22/2024   11:43 AM  BMP  Glucose 70 - 99 mg/dL 578  469  629   BUN 8 - 23 mg/dL 6  7  7     Creatinine 0.44 - 1.00 mg/dL 5.28  4.13  2.44   Sodium 135 - 145 mmol/L 139  140  137   Potassium 3.5 - 5.1 mmol/L 3.6  3.8  3.7   Chloride 98 - 111 mmol/L 103  105  103   CO2 22 - 32 mmol/L 28  28  23    Calcium 8.9 - 10.3 mg/dL 8.7  8.8  8.6       Physical Exam: Lower Extremity Exam Large circular necrotic ulceration lateral aspect of the TMA site on the right foot. Malodor is present  Nonpalpable DP  and PT pulse on the right lower extremity. Sensation absent right foot  BKA left lower extremity   ASSESSMENT/PLAN OF CARE 63 y.o. female with PMHx significant for  HTN, T2DM, COPD, left BKA (2014) and right TMA (01/2023), CKD2  with osteomyelitis of the fifth metatarsal stump and infected ulceration lateral aspect TMA right foot  WBC 7.4 ESR 96 CRP 13.3  MRI  R foot:  1. Soft tissue wound at the lateral plantar midfoot with osteomyelitis of the remnant fifth metatarsal shaft. 2. Status post transmetatarsal amputation. 3. Additional unchanged chronic findings, as above.  -Patient will require I&D with fifth metatarsal partial ray resection, possible wound VAC antibiotic beads and graft application.  Timing to be determined pending results of vascular study - Possible vascular consult depending on ABI PVR results -patient does have a history of PAD has nonpalpable pulses and necrotic ulceration with underlying osteomyelitis all of which places her at high risk for proximal limb amputation - Antibiotics per primary, currently being held until following procedure - Anticoagulation: Lovenox - Wound care:  Betadine adhesive dressing to right foot preoperatively - WB status: Weightbearing as tolerated right lower extremity - Will continue to follow   Thank you for the consult.  Please contact me directly with any questions or concerns.           Corinna Gab, DPM [facility] / [facility]    [address] [place], [place]  [address]                Office [phone]  Fax [phone]

## 2024-03-25 NOTE — Consult Note (Signed)
 WOC Nurse Consult Note: patient with history of PVD and multiple amputations; most recent R TMA amputation by podiatry; has been followed in their office and visit 4/3 concern for worsening infection  Reason for Consult: R lower extremity wounds  Wound type: 1. Full thickness R lower leg likely r/t venous insufficiency  2.  Full thickness R foot infectious  Pressure Injury POA: NA not r/t pressure  Measurement: per nursing flowsheet  Wound bed: 1.  R lower leg wounds 50% yellow 50% pink  2.  R foot necrotic tissue  Drainage (amount, consistency, odor) see nursing flowsheet  Periwound: leg appears to have chronic edema, hyperkeratotic skin  Dressing procedure/placement/frequency:  Cleanse R lower leg (intact skin and open wounds) with Vashe wound cleanser Hart [place] [phone]), do not rinse and allow to air dry. Cover wound beds with Xeroform gauze [employer] [place] [phone]), ABD pads and secure with Kerlix roll gauzebeginning above toes and ending right below knee.  R foot wound will be managed by podiatry.  Wound care orders to be placed by them.    POC discussed with bedside nurse. WOC team will not follow. Re-consult if further needs arise.   Thank you,    Priscella Mann MSN, RN-BC, [employer] [phone]

## 2024-03-25 NOTE — Progress Notes (Signed)
 HD#1 SUBJECTIVE:  Patient Summary: Jennifer [name] is a 63 y.o. 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   Overnight Events: Admitted, had MRI of the right foot and showed: IMPRESSION: 1. Soft tissue wound at the lateral plantar midfoot with osteomyelitis of the remnant fifth metatarsal shaft. 2. Status post transmetatarsal amputation. 3. Additional unchanged chronic findings, as above.  Interim History: Patient was seen at bedside. She reports feeling better compared to when she came in. Her questions regarding possible I&D adequately addressed.  OBJECTIVE:  Vital Signs: Vitals:   03/24/24 2143 03/25/24 0617 03/25/24 0749 03/25/24 0836  BP: (!) 157/78 (!) 156/52 (!) 155/64   Pulse: 79 72 71   Resp: 18 18 18    Temp: 98.4 F (36.9 C) 98.1 F (36.7 C) 98.4 F (36.9 C)   TempSrc:   Oral   SpO2: 100% 96% (!) 89% 92%  Weight:      Height:       Supplemental O2: Room Air SpO2: 92 %  Filed Weights   03/24/24 1430  Weight: 99.8 kg     Intake/Output Summary (Last 24 hours) at 03/25/2024 1157 Last data filed at 03/25/2024 6644 Gross per 24 hour  Intake --  Output 0 ml  Net 0 ml   Net IO Since Admission: 0 mL [03/25/24 1157]  Physical Exam: NAD Heart sounds are clear, with no murmurs, regular rate and rhythm Right foot wound has  foul odor with mild drainage  Normal mood and affect, oriented to time place and person   Patient Lines/Drains/Airways Status     Active Line/Drains/Airways     Name Placement date Placement time Site Days   Peripheral IV 03/24/24 20 G Left Antecubital 03/24/24  1532  Antecubital  1   Peripheral IV 03/24/24 20 G Right Antecubital 03/24/24  1807  Antecubital  1   Negative Pressure Wound Therapy Leg Left 04/01/19  0958  --  1820   Airway 7 mm 02/12/23  1859  -- 407   Wound 03/09/13 Diabetic ulcer  Foot Left 03/09/13  1130  Foot  4034   Wound 04/30/13 Other (Comment) Foot Left rolled edges of wound with yellowish and greenish drainage 04/30/13  1000  Foot  3982   Wound / Incision (Open or Dehisced) 05/03/14 Other (Comment) Leg Anterior;Left 05/03/14  2000  Leg  3614   Wound / Incision (Open or Dehisced) 11/07/20 (MASD) Moisture Associated Skin Damage Buttocks Right;Left;Bilateral MASD redness bilateral buttocks 11/07/20  1800  Buttocks  1234   Wound / Incision (Open or Dehisced) 02/12/23 Diabetic ulcer Foot Anterior;Right 02/12/23  0938  Foot  407             ASSESSMENT/PLAN:  Assessment: Principal Problem:   Right foot infection Active Problems:   Diabetes mellitus (HCC)   Hypertension  Plan: #Osteomyelitis of the remnant fifth metatarsal shaft. - Patient is stable , no signs of systemic infection . WBC of 7.4 - Follow up on Podiatry rec - Consult to wound care  - F/U on blood cultures  - Will discuss initiating of antibiotics after deep wound culture is collected post op   # Type 2 diabetes with hyperglycemia - Continue CBG monitoring - SSI  # Hyperlipidemia # History of PAD - Continue home aspirin and rosuvastatin - Follow-up in right lower extremity ABIs  Chronic  back pain: continue home Lyrica and oxycodone 5 mg Q4H PRN COPD/Tobacco use: continue home Anoro inhaler Depression: continue home duloxetine  GERD: continue home PPI Hx of CKD2: Renal function stable. Continue to monitor.  Urinary incontinence: no acute change, continue home Myrbetriq  Best Practice: Diet: Regular diet IVF: Fluids: None , Rate: None VTE: enoxaparin (LOVENOX) injection 40 mg Start: 03/24/24 2015 Code: Full AB: None  Therapy Recs: None, DME: none Family Contact: , to be notified. DISPO: Anticipated discharge tomorrow to  TBD  pending  Further medical work up  .  Signature: Kathleen Lime , MD Internal Medicine Resident, PGY-1 [facility]  Pager:  [phone] 11:57 AM, 03/25/2024   Please contact the on call pager after 5 pm and on weekends at [phone].

## 2024-03-25 NOTE — Plan of Care (Signed)
   Problem: Activity: Goal: Risk for activity intolerance will decrease Outcome: Progressing   Problem: Coping: Goal: Level of anxiety will decrease Outcome: Progressing   Problem: Pain Managment: Goal: General experience of comfort will improve and/or be controlled Outcome: Progressing

## 2024-03-26 ENCOUNTER — Inpatient Hospital Stay (HOSPITAL_COMMUNITY)

## 2024-03-26 DIAGNOSIS — I70201 Unspecified atherosclerosis of native arteries of extremities, right leg: Secondary | ICD-10-CM

## 2024-03-26 DIAGNOSIS — L089 Local infection of the skin and subcutaneous tissue, unspecified: Secondary | ICD-10-CM

## 2024-03-26 DIAGNOSIS — E1159 Type 2 diabetes mellitus with other circulatory complications: Secondary | ICD-10-CM | POA: Diagnosis not present

## 2024-03-26 LAB — VAS US ABI WITH/WO TBI: Right ABI: 0.68

## 2024-03-26 LAB — GLUCOSE, CAPILLARY
Glucose-Capillary: 271 mg/dL — ABNORMAL HIGH (ref 70–99)
Glucose-Capillary: 207 mg/dL — ABNORMAL HIGH (ref 70–99)
Glucose-Capillary: 222 mg/dL — ABNORMAL HIGH (ref 70–99)
Glucose-Capillary: 190 mg/dL — ABNORMAL HIGH (ref 70–99)

## 2024-03-26 MED ORDER — OXYCODONE HCL 5 MG PO TABS
5.0000 mg | ORAL_TABLET | ORAL | Status: DC | PRN
  Administered 2024-03-26 – 2024-03-27 (×5): 10 mg via ORAL
  Administered 2024-03-27 (×2): 5 mg via ORAL
  Administered 2024-03-27 – 2024-03-28 (×4): 10 mg via ORAL
  Administered 2024-03-29: 5 mg via ORAL
  Administered 2024-03-29 – 2024-03-30 (×3): 10 mg via ORAL
  Administered 2024-03-31: 5 mg via ORAL
  Filled 2024-03-26: qty 1
  Filled 2024-03-26 (×5): qty 2
  Filled 2024-03-26: qty 1
  Filled 2024-03-26 (×3): qty 2
  Filled 2024-03-26: qty 1
  Filled 2024-03-26 (×6): qty 2

## 2024-03-26 NOTE — Plan of Care (Signed)
   Problem: Education: Goal: Knowledge of General Education information will improve Description: Including pain rating scale, medication(s)/side effects and non-pharmacologic comfort measures Outcome: Progressing   Problem: Activity: Goal: Risk for activity intolerance will decrease Outcome: Progressing   Problem: Nutrition: Goal: Adequate nutrition will be maintained Outcome: Progressing

## 2024-03-26 NOTE — Progress Notes (Signed)
 Patient at the time of admission to the floor states that her rollator walker left in ED. ED notified and was told that her walker will be sent to her room via security. Called ED again in the morning of 4/4 when walker was not brought by anybody, the day shift security told that walker was left in 4 N. Went and checked 4N for her walker but wasn't able to find in 4 N either. Patient worried about missing walker, says she won't be able to walk without her rolllator

## 2024-03-26 NOTE — Progress Notes (Signed)
 Orthopedic Tech Progress Note Patient Details:  Jennifer [name] September 20, 1961 [id number]  Ortho Devices Type of Ortho Device: Postop shoe/boot Ortho Device/Splint Location: Right foot Ortho Device/Splint Interventions: Application   Post Interventions Patient Tolerated: Well  Jennifer [name] 03/26/2024, 1:11 PM

## 2024-03-26 NOTE — Progress Notes (Addendum)
 HD#2 SUBJECTIVE:  Patient Summary: Jennifer [name] is a 63 y.o. 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   Overnight Events: NAEO   Interim History: Seen at bedside in no acute distress. Says she feels well overall. No new concerns other than her pain control    OBJECTIVE:  Vital Signs: Vitals:   03/25/24 2015 03/25/24 2051 03/26/24 0430 03/26/24 0749  BP: 111/65 126/62 (!) 129/59 (!) 123/56  Pulse: 88 76 73 65  Resp: 15 18 17 18   Temp: 98.3 F (36.8 C) 98.7 F (37.1 C) 99.2 F (37.3 C) 98.6 F (37 C)  TempSrc:  Oral Oral Oral  SpO2: 98% 97% 97% 95%  Weight:      Height:       Supplemental O2: Room Air SpO2: 95 %  Filed Weights   03/24/24 1430  Weight: 99.8 kg     Intake/Output Summary (Last 24 hours) at 03/26/2024 1006 Last data filed at 03/25/2024 1600 Gross per 24 hour  Intake 240 ml  Output --  Net 240 ml   Net IO Since Admission: 240 mL [03/26/24 1006]  Physical Exam: Not in acute distress  Heart sounds are clear, with no murmurs, regular rate and rhythm Lungs are clear on auscultation, on RA  Right foot wound is dressing is dry,clean and intact  Normal mood and affect, oriented to time place and person   Patient Lines/Drains/Airways Status     Active Line/Drains/Airways     Name Placement date Placement time Site Days   Peripheral IV 03/24/24 20 G Left Antecubital 03/24/24  1532  Antecubital  1   Peripheral IV 03/24/24 20 G Right Antecubital 03/24/24  1807  Antecubital  1   Negative Pressure Wound Therapy Leg Left 04/01/19  0958  --  1820   Airway 7 mm 02/12/23  1859  -- 407   Wound 03/09/13 Diabetic ulcer Foot Left 03/09/13  1130  Foot  4034   Wound 04/30/13 Other (Comment) Foot Left rolled edges of wound with yellowish and greenish drainage 04/30/13  1000  Foot  3982   Wound / Incision (Open or  Dehisced) 05/03/14 Other (Comment) Leg Anterior;Left 05/03/14  2000  Leg  3614   Wound / Incision (Open or Dehisced) 11/07/20 (MASD) Moisture Associated Skin Damage Buttocks Right;Left;Bilateral MASD redness bilateral buttocks 11/07/20  1800  Buttocks  1234   Wound / Incision (Open or Dehisced) 02/12/23 Diabetic ulcer Foot Anterior;Right 02/12/23  0938  Foot  407             ASSESSMENT/PLAN:  Assessment: Principal Problem:   Right foot infection Active Problems:   Diabetes mellitus (HCC)   Hypertension   Subacute osteomyelitis of right foot (HCC)  Plan: #Osteomyelitis of the remnant fifth metatarsal shaft. - Pending ABIs - Awaiting surgery with podiatry  - Will optimize pain control    # Type 2 diabetes with hyperglycemia - Continue CBG monitoring - SSI  # Hyperlipidemia # History of PAD - Continue home aspirin and rosuvastatin - Follow-up in right lower extremity ABIs  Chronic back pain: continue home Lyrica and oxycodone 5 mg Q4H PRN COPD/Tobacco use: continue home Anoro inhaler Depression: continue home duloxetine  GERD: continue home PPI Hx of CKD2: Renal function stable. Continue to monitor.  Urinary incontinence: no acute change, continue home Myrbetriq  Best Practice: Diet:  Regular diet IVF: Fluids: None , Rate: None VTE: enoxaparin (LOVENOX) injection 40 mg Start: 03/24/24 2015 Code: Full AB: None  Therapy Recs: None, DME: none Family Contact: , to be notified. DISPO: Anticipated discharge tomorrow to  TBD  pending  Further medical work up  .  Signature: Kathleen Lime , MD Internal Medicine Resident, PGY-1 [facility]  Pager: [phone] 10:06 AM, 03/26/2024   Please contact the on call pager after 5 pm and on weekends at [phone].

## 2024-03-26 NOTE — Progress Notes (Signed)
 ABI has been completed.   Results can be found under chart review under CV PROC. 03/26/2024 12:58 PM Yariah Selvey RVT, RDMS

## 2024-03-26 NOTE — Progress Notes (Signed)
 PODIATRY PROGRESS NOTE  NAME NALANI ANDREEN MRN [id number] DOB 30-Mar-1961 DOA 03/24/2024   Reason for consult:  Chief Complaint  Patient presents with   Wound Infection     History of present illness: 63 y.o. female with right foot ulcer and osteomyelitis. States she is still having pain, mostly in the ankle area. She just had arterial studies done. No fevers currently but states she did have one overnight.   Vitals:   03/26/24 0430 03/26/24 0749  BP: (!) 129/59 (!) 123/56  Pulse: 73 65  Resp: 17 18  Temp: 99.2 F (37.3 C) 98.6 F (37 C)  SpO2: 97% 95%       Latest Ref Rng & Units 03/25/2024    5:47 AM 03/24/2024    3:30 PM 03/22/2024   11:43 AM  CBC  WBC 4.0 - 10.5 K/uL 7.4  9.2  11.5   Hemoglobin 12.0 - 15.0 g/dL 08.6  57.8  46.9   Hematocrit 36.0 - 46.0 % 34.5  35.2  38.3   Platelets 150 - 400 K/uL 214  221  171        Latest Ref Rng & Units 03/25/2024    5:47 AM 03/24/2024    3:30 PM 03/22/2024   11:43 AM  BMP  Glucose 70 - 99 mg/dL 629  528  413   BUN 8 - 23 mg/dL 6  7  7    Creatinine 0.44 - 1.00 mg/dL 2.44  0.10  2.72   Sodium 135 - 145 mmol/L 139  140  137   Potassium 3.5 - 5.1 mmol/L 3.6  3.8  3.7   Chloride 98 - 111 mmol/L 103  105  103   CO2 22 - 32 mmol/L 28  28  23    Calcium 8.9 - 10.3 mg/dL 8.7  8.8  8.6       Physical Exam: General: AAO x 3   Dermatology: Necrotic ulceration noted to the distal lateral aspect of the foot without any purulence noted today.  There are some malodor coming from the wound.  Ischial area skin breakdown to the anterior aspect of her leg which is noted previously as well.  The edema leg has improved compared to what it was when I saw her in the clinic.  There is no crepitation.  Vascular: Foot appears to be warm and perfused  Neurological: Sensation decreased  Musculoskeletal Exam: He is tenderness to the lower extremity.  The edema to the leg is improved.    ASSESSMENT/PLAN OF CARE  Ulceration with osteomyelitis right  foot  -For now antibiotics are being held for culture data.  Organ a plan for surgery on Monday for wound debridement, excision as well as fifth metatarsal excision.  This is pending arterial studies which the results are not yet available.  If she starts to develop systemic symptoms would recommend starting antibiotics. - If arterial study is abnormal recommend vascular consult. - Discussed plan for surgery on Monday with the patient. - Surgical shoe ordered - Elevation. - Podiatry will continue to follow.    Please contact me directly with any questions or concerns.     Ovid Curd, DPM [facility]  Dr. Lesia Sago. Aroush Chasse, DPM    [address]  [place], [place] [address]                Office [phone]  Fax [phone]

## 2024-03-27 DIAGNOSIS — I70234 Atherosclerosis of native arteries of right leg with ulceration of heel and midfoot: Secondary | ICD-10-CM | POA: Diagnosis not present

## 2024-03-27 DIAGNOSIS — J449 Chronic obstructive pulmonary disease, unspecified: Secondary | ICD-10-CM | POA: Diagnosis not present

## 2024-03-27 DIAGNOSIS — L089 Local infection of the skin and subcutaneous tissue, unspecified: Secondary | ICD-10-CM | POA: Diagnosis not present

## 2024-03-27 LAB — GLUCOSE, CAPILLARY
Glucose-Capillary: 224 mg/dL — ABNORMAL HIGH (ref 70–99)
Glucose-Capillary: 318 mg/dL — ABNORMAL HIGH (ref 70–99)
Glucose-Capillary: 170 mg/dL — ABNORMAL HIGH (ref 70–99)
Glucose-Capillary: 268 mg/dL — ABNORMAL HIGH (ref 70–99)

## 2024-03-27 MED ORDER — ONDANSETRON HCL 4 MG/2ML IJ SOLN
4.0000 mg | Freq: Once | INTRAMUSCULAR | Status: AC
  Administered 2024-03-27: 4 mg via INTRAVENOUS
  Filled 2024-03-27: qty 2

## 2024-03-27 MED ORDER — ONDANSETRON HCL 4 MG/2ML IJ SOLN
4.0000 mg | Freq: Four times a day (QID) | INTRAMUSCULAR | Status: DC | PRN
  Filled 2024-03-27: qty 2

## 2024-03-27 MED ORDER — HEPARIN SODIUM (PORCINE) 5000 UNIT/ML IJ SOLN
5000.0000 [IU] | Freq: Three times a day (TID) | INTRAMUSCULAR | Status: AC
  Administered 2024-03-27: 5000 [IU] via SUBCUTANEOUS
  Filled 2024-03-27: qty 1

## 2024-03-27 MED ORDER — HEPARIN SODIUM (PORCINE) 5000 UNIT/ML IJ SOLN: 5000.0000 [IU] | Freq: Three times a day (TID) | INTRAMUSCULAR | Status: DC

## 2024-03-27 NOTE — Progress Notes (Signed)
 HD#3 SUBJECTIVE:  Patient Summary: Jennifer [name] is a 63 y.o. with a pertinent PMH of L BKA in 2014, R TMA in 2024, HTN, T2DM, COPD, CKD stage 2, who presented with R foot pain and drainage and admitted for ulceration with osteomyelitis of the R foot.   Overnight Events: Episode of nausea with vomiting, made somewhat better with a dose of zofran. EKG checked overnight with normal QTc.  Interim History: Jennifer [name] is not feeling well today after some nausea with vomiting overnight. She has a breakfast tray in front of her and is trying to eat small amounts and drink fluids. We discussed her still missing rollator but I assured her we would not discharge her unsafely and without the devices that she needs.  OBJECTIVE:  Vital Signs: Vitals:   03/26/24 1431 03/26/24 2030 03/27/24 0529 03/27/24 0838  BP: (!) 137/56 (!) 131/59 (!) 149/65 116/70  Pulse: 68 67 89 82  Resp: 17 18 18    Temp: 97.9 F (36.6 C) (!) 97.5 F (36.4 C) 98.8 F (37.1 C) 100.1 F (37.8 C)  TempSrc: Oral  Oral Oral  SpO2: 93% 92% 94% 93%  Weight:      Height:       Supplemental O2: Room Air SpO2: 93 %  Filed Weights   03/24/24 1430  Weight: 99.8 kg     Intake/Output Summary (Last 24 hours) at 03/27/2024 0851 Last data filed at 03/26/2024 1700 Gross per 24 hour  Intake 720 ml  Output --  Net 720 ml   Net IO Since Admission: 960 mL [03/27/24 0851]  Physical Exam: Constitutional:Chronically ill appearing female resting comfortably in bed. In no acute distress. Cardio:Regular rate and rhythm. No murmurs, rubs, or gallops. Pulm:Clear to auscultation bilaterally. Normal work of breathing on room air. MSK:S/p L BKA. RLE with ace wrap extending distally from the knee. Skin:Warm and dry. Neuro:Alert and oriented x3. No focal deficit noted. Psych:Pleasant mood and affect.  Patient Lines/Drains/Airways Status     Active Line/Drains/Airways     Name Placement date Placement time Site Days   Peripheral IV  03/24/24 20 G Left Antecubital 03/24/24  1532  Antecubital  3   Peripheral IV 03/24/24 20 G Right Antecubital 03/24/24  1807  Antecubital  3   Negative Pressure Wound Therapy Leg Left 04/01/19  0958  --  1822   Airway 7 mm 02/12/23  1859  -- 409   Wound 03/09/13 Diabetic ulcer Foot Left 03/09/13  1130  Foot  4036   Wound 04/30/13 Other (Comment) Foot Left rolled edges of wound with yellowish and greenish drainage 04/30/13  1000  Foot  3984   Wound / Incision (Open or Dehisced) 05/03/14 Other (Comment) Leg Anterior;Left 05/03/14  2000  Leg  3616   Wound / Incision (Open or Dehisced) 11/07/20 (MASD) Moisture Associated Skin Damage Buttocks Right;Left;Bilateral MASD redness bilateral buttocks 11/07/20  1800  Buttocks  1236   Wound / Incision (Open or Dehisced) 02/12/23 Diabetic ulcer Foot Anterior;Right 02/12/23  0938  Foot  409   Wound / Incision (Open or Dehisced) 03/25/24 Venous stasis ulcer Leg Right Full thickness R lower leg 03/25/24  0800  Leg  2   Wound / Incision (Open or Dehisced) 03/25/24 Other (Comment) Right R foot necrotic tissue 03/25/24  0800  --  2             ASSESSMENT/PLAN:  Assessment: Principal Problem:   Right foot infection Active Problems:   Diabetes mellitus (HCC)  Hypertension   Subacute osteomyelitis of right foot (HCC)  Jennifer [name] is a 63 y.o. with a pertinent PMH of L BKA in 2014, R TMA in 2024, HTN, T2DM, COPD, CKD stage 2, who presented with R foot pain and drainage and admitted for ulceration with osteomyelitis of the R foot.   Plan: Osteomyelitis of the remnant fifth metatarsal shaft. Tmax overnight 100.54F. VSS. Resting R ABI indicates moderate RLE arterial disease. No complaints specifically regarding the R foot this morning. Plan: -Podiatry consulted, appreciate their recommendations -VVS consulted given moderate RLE arterial disease and upcoming operation for osteomyelitis with podiatry; appreciate their recommendations -Pain management with  oxycodone 5-10 mg q4h PRN severe pain, tylenol q6h PRN mild pain or fever -Bowel regimen: senna-docusate 1 tablet daily at bedtime PRN -Zofran 4 mg IV q6h PRN nausea   Type 2 diabetes with hyperglycemia Plan: -CBG checks, SSI TID with meals and at bedtime; goal CBG 140-180 -Continue semglee 20 units daily; consider increasing dose if SSI needs remain elevated   Hyperlipidemia History of PAD Plan: -Continue home ASA 81 mg daily, rosuvastatin 20 mg daily  COPD Plan: -Continue home anoro ellipta 62.5-25 1 puff daily  Hx depression Plan: -Continue home duloxetine 60 mg daily  GERD Plan: -Continue home protonix 40 mg daily  Chronic back pain Plan: -Continue home pregabalin 200 mg TID, oxycodone as above for acute and chronic pain  HTN Stable. Plan: -Continue home amlodipine 10 mg daily, metoprolol 100 mg daily, spironolactone 50 mg daily  Hx urinary incontinence Plan: -Continue home myrbetriq 50 mg daily   Best Practice: Diet: Regular diet IVF: None VTE: enoxaparin (LOVENOX) injection 40 mg Start: 03/24/24 2015 Code: Full AB: None  Signature: Champ Mungo, D.O.  Internal Medicine Resident, PGY-3 [facility]  Pager: [phone]   Please contact the on call pager after 5 pm and on weekends at [phone].

## 2024-03-27 NOTE — Progress Notes (Signed)
 PODIATRY PROGRESS NOTE  NAME Jennifer [name] MRN [id number] DOB [birth year] DOA 03/24/2024   Reason for consult:  Chief Complaint  Patient presents with   Wound Infection    History of present illness: 63 y.o. female with right foot ulcer and osteomyelitis.  Vascular plan for angio tomorrow.  She says she had a fever overnight, denies any.  Vitals:   03/27/24 0838 03/27/24 1008  BP: 116/70   Pulse: 82   Resp:    Temp: 100.1 F (37.8 C) 98.9 F (37.2 C)  SpO2: 93%      CBC    Component Value Date/Time   WBC 7.4 03/25/2024 0547   RBC 3.76 (L) 03/25/2024 0547   HGB 11.5 (L) 03/25/2024 0547   HGB 12.2 09/01/2022 1000   HCT 34.5 (L) 03/25/2024 0547   HCT 38.5 09/01/2022 1000   PLT 214 03/25/2024 0547   PLT 175 09/01/2022 1000   MCV 91.8 03/25/2024 0547   MCV 93 09/01/2022 1000   MCH 30.6 03/25/2024 0547   MCHC 33.3 03/25/2024 0547   RDW 13.5 03/25/2024 0547   RDW 14.4 09/01/2022 1000   LYMPHSABS 2.4 03/24/2024 1530   LYMPHSABS 2.7 09/01/2022 1000   MONOABS 0.5 03/24/2024 1530   EOSABS 0.1 03/24/2024 1530   EOSABS 0.1 09/01/2022 1000   BASOSABS 0.0 03/24/2024 1530   BASOSABS 0.1 09/01/2022 1000         Latest Ref Rng & Units 03/25/2024    5:47 AM 03/24/2024    3:30 PM 03/22/2024   11:43 AM  BMP  Glucose 70 - 99 mg/dL 308  657  846   BUN 8 - 23 mg/dL 6  7  7    Creatinine 0.44 - 1.00 mg/dL 9.62  9.52  8.41   Sodium 135 - 145 mmol/L 139  140  137   Potassium 3.5 - 5.1 mmol/L 3.6  3.8  3.7   Chloride 98 - 111 mmol/L 103  105  103   CO2 22 - 32 mmol/L 28  28  23    Calcium 8.9 - 10.3 mg/dL 8.7  8.8  8.6        Physical Exam: General: AAO x 3   Dermatology: Necrotic ulceration noted to the distal lateral aspect of the foot without any purulence noted today.  There is malodor coming from the wound.  Superficial area of skin breakdown to the anterior aspect of her leg which is noted previously as well.  The edema leg has improved. No crepitation.      Vascular: Foot appears to be warm and perfused  Neurological: Sensation decreased  Musculoskeletal Exam: He is tenderness to the lower extremity.  The edema to the leg is improved.    ASSESSMENT/PLAN OF CARE  Ulceration with osteomyelitis right foot  -Vascular planning for angio on Monday. Will plan for surgery on the right foot possibly Tuesday.  Wound debridement, excision with 5th metatarsal head excision. -Currently afebrile, temperature rechecked while in the room today.  -Dressing applied.  -Antibiotics been held to obtain intraoperative cultures. If she becomes symptomatic would recommend starting.  - Surgical shoe  - Elevation. - Podiatry will continue to follow.    Please contact me directly with any questions or concerns.     Ovid Curd, DPM [facility]  Dr. Lesia Sago. Riham Polyakov, DPM    [address]  [place], [place] [address]                Office [phone]  Fax [phone]

## 2024-03-27 NOTE — Plan of Care (Signed)
   Problem: Activity: Goal: Risk for activity intolerance will decrease Outcome: Progressing   Problem: Nutrition: Goal: Adequate nutrition will be maintained Outcome: Progressing   Problem: Pain Managment: Goal: General experience of comfort will improve and/or be controlled Outcome: Progressing

## 2024-03-27 NOTE — Consult Note (Addendum)
 Hospital Consult    Reason for Consult:  R TMA wound Requesting Physician:  Internal Medicine MRN #:  [id number]  History of Present Illness: This is a 63 y.o. female with past medical history significant for hypertension, hyperlipidemia, COPD, type 2 diabetes mellitus.  She is being seen in consultation for evaluation of right TMA wound.  Workup included noninvasive imaging which demonstrates a right ABI of 0.68.  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  She also has surgical history significant for left below the knee amputation.  She is ambulatory with a prosthetic and a rolling walker.  Hemoglobin A1c has of February was 9.2.  She denies claudication in the right leg.  She denies rest pain in the right foot.  She is a smoker.  Past Medical History:  Diagnosis Date   Acute vestibular syndrome, resolved 03/02/2021   Angioedema 06/14/2021   Asthma    Burn of finger of right hand, second degree 02/05/2021   Occurred during cooking (frying), poor sensation due to neuropathy, pt punctured blister to allow it to drain, skin has since desquamated over dorsal joint, no infection.  Keep clean and dry, OTC antibacterial ointment.   Cataract    CHF (congestive heart failure) (HCC)    Chronic bronchitis (HCC)    "I get it alot" (09/28/2013)   Chronic diastolic heart failure (HCC)    grade 2 per 2D echocardiogram (01/2013)   Chronic kidney disease    Chronic lower back pain    Chronic pain syndrome 12/03/2011   Likely secondary to depression, "fibromyalgia", neuropathy, and obesity. Lumbar MRI 2014 no sig change from prior (2008) : Stable hypertrophic facet disease most notable at L4-5. Stable shallow left foraminal/extraforaminal disc protrusion at L4-5. No direct neural compression.       Chronic right shoulder pain 10/10/2021   COPD 01/08/2007   PFT's 05/2007 : FEV1/FVC 82, FEV1 64% pred, FEF 25-75% 40% predicted, 16% improvement in FEV1 with  bronchodilators.      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 Right brachial vein, diagnosed on 03/10/2013 Coumadin for 3 months. 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  S. Aureus and GBS.    History of use of hearing aid    Hyperlipidemia    Hyperplastic colon polyp 12/2010   Per colonoscopy (12/2010) - Dr. 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       Initially evaluated  08/2019 with dedicated MRI: "IMPRESSION:  Skin marker overlies a 9 x 10 x 11 mm cyst within the anterior  aspect of the superficial lobe of the right parotid gland. This is  presumed to be a benign cyst. The possibility of a cystic Warthin's  tumor   Pneumonia    PVD (peripheral vascular disease) with claudication (HCC)    Stents to bilateral common iliac arteries (left 2005, right 2008), on chronic plavix   Pyelonephritis 10/28/2020   S/P BKA (below knee amputation) unilateral (HCC)    2014 L - failed limb preserving treatment. 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  Evaluated on 07/08/2021 by podiatrist Dr. Logan Bores who debrided necrotic tissue and prescribed doxycycline.  He will see her again in 3 weeks.  The location of this ulcer on the dorsal aspect of the toe is somewhat atypical for a purely diabetic foot wound, and she does have a strong DP pulse.  I did not examine h   Type II diabetes mellitus with peripheral circulatory disorders, uncontrolled DX: 1993   Insulin dep. Poor control. Complicated by diabetic foot ulcer and diabetic eye disease.     Upper respiratory tract infection due to COVID-19 virus 07/22/2022   Urinary incontinence, severe, mixed (stress, urge, functional) 02/14/2023   Unresponsive to medications    Past Surgical History:  Procedure Laterality Date   ABDOMINAL HYSTERECTOMY  1997   secondary to uterine fibroids   AMPUTATION Left 08/31/2013   Procedure: AMPUTATION RAY;  Surgeon: Nadara Mustard, MD;  Location: [facility] OR;  Service: Orthopedics;  Laterality: Left;  Left Foot 5th Ray Amputation   AMPUTATION Left 09/28/2013   Procedure: Left Midfoot amputation;  Surgeon: Nadara Mustard, MD;  Location: [facility] OR;  Service: Orthopedics;  Laterality: Left;  Left Midfoot amputation   AMPUTATION Left 10/14/2013   Procedure: AMPUTATION BELOW KNEE- left;  Surgeon: Nadara Mustard, MD;  Location: [facility] OR;  Service: Orthopedics;  Laterality: Left;  Left Below Knee Amputation    AMPUTATION TOE Right 01/15/2017   Procedure: AMPUTATION 5th TOE RIGHT FOOT;  Surgeon: Felecia Shelling, DPM;  Location: [facility] OR;  Service: Podiatry;  Laterality: Right;   AMPUTATION TOE Right 02/12/2023   Procedure: Right Foot Transmetatarsal Amputation;  Surgeon: [name], DPM;  Location: [facility] OR;  Service: Podiatry;  Laterality: Right;    APPLICATION OF WOUND VAC  04/01/2019   Procedure: Application Of Wound Vac;  Surgeon: Nadara Mustard, MD;  Location: [facility] OR;  Service: Orthopedics;;   BLADDER SURGERY     bladder reconstruction surgery   BOTOX INJECTION N/A 08/21/2021   Procedure: CYSTOSCOPY BOTOX INJECTION;  Surgeon: Crista Elliot, MD;  Location: [facility] ORS;  Service: Urology;  Laterality: N/A;   BREAST BIOPSY     multiple-benign per pt   CATARACT EXTRACTION, BILATERAL     summer 2022   COLONOSCOPY     DILATION AND CURETTAGE OF UTERUS  1985   ESOPHAGOGASTRODUODENOSCOPY N/A 09/20/2013   Procedure: ESOPHAGOGASTRODUODENOSCOPY (EGD);  Surgeon: Beverley Fiedler, MD;  Location: [facility] ENDOSCOPY;  Service: Gastroenterology;  Laterality: N/A;   EYE SURGERY Bilateral 2022   Cataract removal in June and then July   FOOT AMPUTATION THROUGH METATARSAL Left  09/28/2013   GANGLION CYST EXCISION     multiple   PAROTIDECTOMY Right 05/14/2022   Procedure: PAROTIDECTOMY;  Surgeon: Christia Reading, MD;  Location: [facility] OR;  Service: ENT;  Laterality: Right;   PERIPHERAL VASCULAR INTERVENTION     stents in lower ext   PREAURICULAR CYST EXCISION N/A 05/14/2022   Procedure: EXCISION OF SCALP SKIN CYST, 1.5cm;  Surgeon: Christia Reading, MD;  Location: [facility] OR;  Service: ENT;  Laterality: N/A;   SHOULDER ARTHROSCOPY Right 11/11/2019   RIGHT SHOULDER ARTHROSCOPY AND DEBRIDEMENT    SHOULDER ARTHROSCOPY Right 11/11/2019   Procedure: RIGHT SHOULDER ARTHROSCOPY AND DEBRIDEMENT;  Surgeon: Nadara Mustard, MD;  Location: [facility] OR;  Service: Orthopedics;  Laterality: Right;   SHOULDER ARTHROSCOPY W/ ROTATOR CUFF REPAIR Bilateral    2 on right one on left   SKIN SPLIT GRAFT Bilateral 05/13/2013   Procedure: Right and Left Foot Allograft Skin Graft;  Surgeon: Nadara Mustard, MD;  Location: [facility] OR;  Service: Orthopedics;  Laterality: Bilateral;  Right and Left Foot Allograft Skin Graft   STUMP REVISION Left 04/01/2019   Procedure: REVISION LEFT BELOW KNEE AMPUTATION;   Surgeon: Nadara Mustard, MD;  Location: [facility] OR;  Service: Orthopedics;  Laterality: Left;   TEE WITHOUT CARDIOVERSION N/A 01/31/2013   Procedure: TRANSESOPHAGEAL ECHOCARDIOGRAM (TEE);  Surgeon: Pricilla Riffle, MD;  Location: [facility] ENDOSCOPY;  Service: Cardiovascular;  Laterality: N/A;  Rm 3W25   TEE WITHOUT CARDIOVERSION N/A 03/10/2013   Procedure: TRANSESOPHAGEAL ECHOCARDIOGRAM (TEE);  Surgeon: Laurey Morale, MD;  Location: [facility] ENDOSCOPY;  Service: Cardiovascular;  Laterality: N/A;  Rm. [id number]   TOE AMPUTATION Left 08/31/2013   4TH & 5 TH TOE    TONSILLECTOMY     TUBAL LIGATION     WRIST SURGERY Right    "for tumors" (09/28/2013)    Allergies  Allergen Reactions   Benazepril Swelling    Possible tongue swelling 06/14/2021   Chantix [Varenicline] Other (See Comments)    Unknown reaction   Ioversol    Morphine Sulfate    Pineapple Swelling    Fresh pineapple - lip swelling   Abilify [Aripiprazole] Other (See Comments)    Urinary freq Nov 2016   Iohexol Other (See Comments)    IV CONTRAST CAUSED TRANSIENT NEPHROPATHY (KIDNEY INSUFFICIENCY) IN 2007    Ivp Dye [Iodinated Contrast Media] Other (See Comments)    IV CONTRAST CAUSED TRANSIENT NEPHROPATHY (KIDNEY INSUFFICIENCY) IN 2007    Prior to Admission medications   Medication Sig Start Date End Date Taking? Authorizing Provider  acetaminophen (TYLENOL) 650 MG CR tablet Take 2 tablets (1,300 mg total) by mouth every 8 (eight) hours as needed for pain. Patient taking differently: Take 1,950 mg by mouth daily as needed for pain. 02/16/23  Yes Mapp, Tavien, MD  albuterol (PROAIR HFA) 108 (90 Base) MCG/ACT inhaler INHALE 2 PUFFS BY MOUTH EVERY 6 HOURS AS NEEDED FOR WHEEZING 11/04/23  Yes Miguel Aschoff, MD  amLODipine (NORVASC) 10 MG tablet Take 1 tablet (10 mg total) by mouth daily. 08/06/23  Yes Miguel Aschoff, MD  Baclofen 5 MG TABS Take 1 tablet (5 mg total) by mouth 2 (two) times daily as needed (muscle spasms). 02/18/24  Yes  Miguel Aschoff, MD  doxycycline (VIBRAMYCIN) 100 MG capsule Take 1 capsule (100 mg total) by mouth 2 (two) times daily. 03/22/24  Yes Plunkett, Alphonzo Lemmings, MD  DULoxetine (CYMBALTA) 60 MG capsule Take 1 capsule (60 mg total) by mouth daily. 02/18/24  Yes  Miguel Aschoff, MD  insulin degludec (TRESIBA FLEXTOUCH) 200 UNIT/ML FlexTouch Pen Inject 65 Units into the skin daily. 02/18/24  Yes Miguel Aschoff, MD  lidocaine (LIDODERM) 5 % Place 1 patch onto the skin daily. Remove & Discard patch within 12 hours or as directed by MD 03/15/24  Yes Gwyneth Sprout, MD  metFORMIN (GLUCOPHAGE-XR) 500 MG 24 hr tablet Take 1 tablet (500 mg total) by mouth daily with breakfast. 08/06/23  Yes Miguel Aschoff, MD  methocarbamol (ROBAXIN) 500 MG tablet Take 1 tablet (500 mg total) by mouth 2 (two) times daily. Patient taking differently: Take 500 mg by mouth 3 (three) times daily as needed for muscle spasms. 03/15/24  Yes Gwyneth Sprout, MD  metoCLOPramide (REGLAN) 5 MG tablet Take 1 tablet (5 mg total) by mouth 3 (three) times daily before meals. 02/18/24  Yes Miguel Aschoff, MD  metoprolol succinate (TOPROL-XL) 100 MG 24 hr tablet Take 1 tablet (100 mg total) by mouth daily. TAKE WITH OR IMMEDIATELY FOLLOWING A MEAL. 02/18/24  Yes Miguel Aschoff, MD  MYRBETRIQ 50 MG TB24 tablet Take 1 tablet (50 mg total) by mouth daily. 02/18/24  Yes Miguel Aschoff, MD  ondansetron (ZOFRAN-ODT) 4 MG disintegrating tablet Take 1 tablet (4 mg total) by mouth every 8 (eight) hours as needed for nausea or vomiting. Patient taking differently: Take 4 mg by mouth daily as needed for nausea or vomiting. 02/18/24  Yes Miguel Aschoff, MD  oxyCODONE (OXY IR/ROXICODONE) 5 MG immediate release tablet Take 1 tablet (5 mg total) by mouth every 4 (four) hours as needed for severe pain (pain score 7-10). Patient taking differently: Take 5 mg by mouth 3 (three) times daily as needed for severe pain (pain score  7-10) or moderate pain (pain score 4-6). 03/10/24  Yes Miguel Aschoff, MD  pantoprazole (PROTONIX) 40 MG tablet Take 1 tablet (40 mg total) by mouth daily. 07/14/23  Yes Miguel Aschoff, MD  pregabalin (LYRICA) 200 MG capsule Take 1 capsule (200 mg total) by mouth 3 (three) times daily. 03/10/24  Yes Miguel Aschoff, MD  rosuvastatin (CRESTOR) 20 MG tablet Take 1 tablet (20 mg total) by mouth in the morning. 02/18/24  Yes Miguel Aschoff, MD  Semaglutide, 1 MG/DOSE, (OZEMPIC, 1 MG/DOSE,) 4 MG/3ML SOPN Inject 1 mg into the skin once a week. 08/06/23  Yes Miguel Aschoff, MD  silver sulfADIAZINE (SILVADENE) 1 % cream Apply 1 Application topically daily. To the wounds on your leg and foot 03/22/24  Yes Gwyneth Sprout, MD  spironolactone (ALDACTONE) 50 MG tablet Take 1 tablet (50 mg total) by mouth daily. Patient taking differently: Take 50 mg by mouth 2 (two) times daily. 02/18/24  Yes Miguel Aschoff, MD  traZODone (DESYREL) 100 MG tablet Take 200 mg by mouth at bedtime as needed for sleep.   Yes [provider]  umeclidinium-vilanterol (ANORO ELLIPTA) 62.5-25 MCG/ACT AEPB Inhale 1 puff into the lungs in the morning. 08/06/23  Yes Miguel Aschoff, MD  Accu-Chek Softclix Lancets lancets Use as back up to CGM sensors up to 1 time a day 11/17/23   Miguel Aschoff, MD  aspirin EC 81 MG tablet Take 1 tablet (81 mg total) by mouth daily. Patient not taking: Reported on 03/25/2024 09/19/21   Miguel Aschoff, MD  Blood Glucose Monitoring Suppl (ACCU-CHEK GUIDE) w/Device KIT Use to check blood sugar up to 1 times a day 11/17/23   Miguel Aschoff, MD  Continuous  Glucose Sensor (DEXCOM G7 SENSOR) MISC Place new sensor every 10 days. Use to monitor blood sugar continuously. 11/17/23   Miguel Aschoff, MD  glucose blood (ACCU-CHEK GUIDE TEST) test strip Use as back up to CGM sensors up to 1 time a day 11/17/23   Miguel Aschoff, MD  Insulin Pen Needle 32G  X 4 MM MISC Use to inject insulin 4 (four) times daily. 02/18/24   Miguel Aschoff, MD  nystatin cream (MYCOSTATIN) Apply 1 Application topically 2 (two) times daily. Patient not taking: Reported on 03/25/2024 10/20/23   Miguel Aschoff, MD  nystatin powder Apply 1 Application topically 3 (three) times daily. Patient not taking: Reported on 03/25/2024 10/20/23   Miguel Aschoff, MD  oxybutynin (DITROPAN-XL) 10 MG 24 hr tablet Take 10 mg by mouth at bedtime. Patient not taking: Reported on 03/25/2024    [provider]  Potassium Chloride ER 20 MEQ TBCR Take 40 mEq by mouth daily. 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   Vaping Use   Vaping status: Never Used  Substance and Sexual Activity   Alcohol use: No    Alcohol/week: 0.0 standard drinks of alcohol   Drug use: No    Types: Marijuana, "Crack" cocaine    Comment: 09/28/2013 "no marijuana since 2011, no crack/cocaine 1989"   Sexual activity: Not Currently  Other Topics Concern   Not on file  Social History Narrative   On disability. Lives with son in [place]. 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    Boyfriend passed away stage 4 cancer 2013/03/10.   S/p L BKA 2014. In wheelchair in paritially suitable apartment.    Social Drivers of Corporate investment banker Strain: High Risk (03/31/2023)   Overall Financial Resource Strain (CARDIA)    Difficulty of  Paying Living Expenses: Hard  Food Insecurity: No Food Insecurity (03/24/2024)   Hunger Vital Sign    Worried About Running Out of Food in the Last Year: Never true    Ran Out of Food in the Last Year: Never true  Transportation Needs: Unmet Transportation Needs (03/24/2024)   PRAPARE - Administrator, Civil Service (Medical): Yes    Lack of Transportation (Non-Medical): Yes  Physical Activity: Inactive (03/31/2023)   Exercise Vital Sign    Days of Exercise per Week: 0 days    Minutes of Exercise per Session: 0 min  Stress: Stress Concern Present (03/31/2023)   [employer] of Occupational Health - Occupational Stress Questionnaire    Feeling of Stress : Very much  Social Connections: Moderately Isolated (03/24/2024)   Social Connection and Isolation Panel [NHANES]    Frequency of Communication with Friends and Family: More than three times a week    Frequency of Social Gatherings with Friends and Family: More  than three times a week    Attends Religious Services: More than 4 times per year    Active Member of Clubs or Organizations: No    Attends Banker Meetings: Never    Marital Status: Divorced  Catering manager Violence: Not At Risk (03/24/2024)   Humiliation, Afraid, Rape, and Kick questionnaire    Fear of Current or Ex-Partner: No    Emotionally Abused: No    Physically Abused: No    Sexually Abused: No     Family History  Problem Relation Age of Onset   Diverticulosis Mother    Diabetes Mother    Hypertension Mother    Congestive Heart Failure Mother    Asthma Father    CAD Sister 72       MI at age 28 per patient.  However, she has not had a stent or CABG.    Heart disease Sister        before age 53   Colon cancer Brother    Breast cancer Neg Hx    Colon polyps Neg Hx    Rectal cancer Neg Hx    Stomach cancer Neg Hx    Esophageal cancer Neg Hx     ROS: Otherwise negative unless mentioned in HPI  Physical Examination  Vitals:    03/27/24 0529 03/27/24 0838  BP: (!) 149/65 116/70  Pulse: 89 82  Resp: 18   Temp: 98.8 F (37.1 C) 100.1 F (37.8 C)  SpO2: 94% 93%   Body mass index is 36.61 kg/m.  General:  WDWN in NAD Gait: Not observed HENT: WNL, normocephalic Pulmonary: normal non-labored breathing, without Rales, rhonchi,  wheezing Cardiac: regular Abdomen:  soft, NT/ND, no masses Skin: without rashes Vascular Exam/Pulses: Unable to palpate pedal pulses of the right foot; exam limited with patient in the recliner Extremities: Extensive tissue loss of the right TMA pictured below Musculoskeletal: no muscle wasting or atrophy  Neurologic: A&O X 3;  No focal weakness or paresthesias are detected; speech is fluent/normal Psychiatric:  The pt has Normal affect. Lymph:  Unremarkable    CBC    Component Value Date/Time   WBC 7.4 03/25/2024 0547   RBC 3.76 (L) 03/25/2024 0547   HGB 11.5 (L) 03/25/2024 0547   HGB 12.2 09/01/2022 1000   HCT 34.5 (L) 03/25/2024 0547   HCT 38.5 09/01/2022 1000   PLT 214 03/25/2024 0547   PLT 175 09/01/2022 1000   MCV 91.8 03/25/2024 0547   MCV 93 09/01/2022 1000   MCH 30.6 03/25/2024 0547   MCHC 33.3 03/25/2024 0547   RDW 13.5 03/25/2024 0547   RDW 14.4 09/01/2022 1000   LYMPHSABS 2.4 03/24/2024 1530   LYMPHSABS 2.7 09/01/2022 1000   MONOABS 0.5 03/24/2024 1530   EOSABS 0.1 03/24/2024 1530   EOSABS 0.1 09/01/2022 1000   BASOSABS 0.0 03/24/2024 1530   BASOSABS 0.1 09/01/2022 1000    BMET    Component Value Date/Time   NA 139 03/25/2024 0547   NA 141 02/18/2024 0932   K 3.6 03/25/2024 0547   CL 103 03/25/2024 0547   CO2 28 03/25/2024 0547   GLUCOSE 171 (H) 03/25/2024 0547   BUN 6 (L) 03/25/2024 0547   BUN 10 02/18/2024 0932   CREATININE 0.84 03/25/2024 0547   CREATININE 0.68 01/31/2015 1641   CALCIUM 8.7 (L) 03/25/2024 0547   GFRNONAA >60 03/25/2024 0547   GFRNONAA >89 01/31/2015 1641   GFRAA 54 (L) 10/25/2020 1131   GFRAA >89 01/31/2015 1641  COAGS: Lab Results  Component Value Date   INR 1.1 10/31/2023   INR 1.1 02/10/2023   INR 1.1 06/14/2021     Non-Invasive Vascular Imaging:   MRI right foot demonstrating osteomyelitis of the remnant fifth metatarsal  Right ABI of 0.68    ASSESSMENT/PLAN: This is a 63 y.o. female being seen in consultation for evaluation of right TMA wound with osteomyelitis of the right fifth metatarsal  [name] is a 63 year old diabetic with a 2-week history of right TMA wound.  She has history of a left below the knee amputation which she is ambulatory with a prosthetic and rolling walker.  Right ABI is 0.68.  On exam I am unable to palpate any pedal pulses.  Given her uncontrolled diabetes with hemoglobin A1c of greater than 9 she is at high risk for major leg amputation.  We discussed proceeding with aortogram with focus on the right leg tomorrow with Dr. Hetty Blend.  Case was discussed in detail with the patient including risks and she is agreeable to proceed.  She will be n.p.o. after midnight.  Consent ordered.  Once she is optimized from a vascular surgery standpoint we can discuss plans for wound debridement.  On-call vascular surgeon Dr. Myra Gianotti will evaluate the patient later today and provide further treatment plans   Jennifer Rutter PA-C [facility] [phone]   I agree with the above.  I have seen and evaluated the patient.  Briefly this is a 63 year old female with history of left below-knee amputation and right TMA by Dr. Lajoyce Corners in 2014.  She has a 2-week history of a right TMA wound.  Recent ABIs were 0.68.  We have recommended angiography prior to proceeding with debridement of the TMA.  Unfortunately because she was sitting up I had difficulty palpating femoral pulses.  She is scheduled for angiography tomorrow.  She will be n.p.o. after midnight  Jennifer [name]

## 2024-03-28 ENCOUNTER — Encounter (HOSPITAL_COMMUNITY)
Admission: EM | Disposition: A | Discharge status: Home / Self Care | Payer: Self-pay | Source: Home / Self Care | Attending: Infectious Diseases

## 2024-03-28 DIAGNOSIS — M86171 Other acute osteomyelitis, right ankle and foot: Secondary | ICD-10-CM

## 2024-03-28 DIAGNOSIS — E1169 Type 2 diabetes mellitus with other specified complication: Secondary | ICD-10-CM | POA: Diagnosis not present

## 2024-03-28 DIAGNOSIS — J449 Chronic obstructive pulmonary disease, unspecified: Secondary | ICD-10-CM

## 2024-03-28 DIAGNOSIS — I70234 Atherosclerosis of native arteries of right leg with ulceration of heel and midfoot: Secondary | ICD-10-CM | POA: Diagnosis not present

## 2024-03-28 HISTORY — PX: LOWER EXTREMITY INTERVENTION: CATH118252

## 2024-03-28 HISTORY — PX: ABDOMINAL AORTOGRAM W/LOWER EXTREMITY: CATH118223

## 2024-03-28 LAB — COMPREHENSIVE METABOLIC PANEL WITH GFR
ALT: 10 U/L (ref 0–44)
AST: 12 U/L — ABNORMAL LOW (ref 15–41)
Albumin: 2.3 g/dL — ABNORMAL LOW (ref 3.5–5.0)
Alkaline Phosphatase: 60 U/L (ref 38–126)
Anion gap: 8 (ref 5–15)
BUN: 8 mg/dL (ref 8–23)
CO2: 28 mmol/L (ref 22–32)
Calcium: 8.6 mg/dL — ABNORMAL LOW (ref 8.9–10.3)
Chloride: 100 mmol/L (ref 98–111)
Creatinine, Ser: 0.95 mg/dL (ref 0.44–1.00)
GFR, Estimated: >60 mL/min (ref >60)
Glucose, Bld: 232 mg/dL — ABNORMAL HIGH (ref 70–99)
Potassium: 4.3 mmol/L (ref 3.5–5.1)
Sodium: 136 mmol/L (ref 135–145)
Total Bilirubin: <0.2 mg/dL (ref 0.0–1.2)
Total Protein: 6 g/dL — ABNORMAL LOW (ref 6.5–8.1)

## 2024-03-28 LAB — CBC
HCT: 37.6 % (ref 36.0–46.0)
Hemoglobin: 12.1 g/dL (ref 12.0–15.0)
MCH: 30.7 pg (ref 26.0–34.0)
MCHC: 32.2 g/dL (ref 30.0–36.0)
MCV: 95.4 fL (ref 80.0–100.0)
Platelets: 227 10*3/uL (ref 150–400)
RBC: 3.94 MIL/uL (ref 3.87–5.11)
RDW: 13.5 % (ref 11.5–15.5)
WBC: 10.3 10*3/uL (ref 4.0–10.5)
nRBC: 0 % (ref 0.0–0.2)

## 2024-03-28 LAB — GLUCOSE, CAPILLARY
Glucose-Capillary: 194 mg/dL — ABNORMAL HIGH (ref 70–99)
Glucose-Capillary: 237 mg/dL — ABNORMAL HIGH (ref 70–99)
Glucose-Capillary: 243 mg/dL — ABNORMAL HIGH (ref 70–99)
Glucose-Capillary: 255 mg/dL — ABNORMAL HIGH (ref 70–99)
Glucose-Capillary: 275 mg/dL — ABNORMAL HIGH (ref 70–99)

## 2024-03-28 LAB — POCT ACTIVATED CLOTTING TIME: Activated Clotting Time: 227 s

## 2024-03-28 SURGERY — ABDOMINAL AORTOGRAM W/LOWER EXTREMITY
Anesthesia: LOCAL | Laterality: Right

## 2024-03-28 MED ORDER — FENTANYL CITRATE (PF) 100 MCG/2ML IJ SOLN
INTRAMUSCULAR | Status: AC
  Filled 2024-03-28: qty 2

## 2024-03-28 MED ORDER — ONDANSETRON HCL 4 MG/2ML IJ SOLN
INTRAMUSCULAR | Status: DC | PRN
  Administered 2024-03-28: 4 mg via INTRAVENOUS

## 2024-03-28 MED ORDER — HYDRALAZINE HCL 20 MG/ML IJ SOLN: 5.0000 mg | INTRAMUSCULAR | Status: DC | PRN

## 2024-03-28 MED ORDER — HEPARIN (PORCINE) IN NACL 1000-0.9 UT/500ML-% IV SOLN
INTRAVENOUS | Status: DC | PRN
  Administered 2024-03-28 (×2): 500 mL

## 2024-03-28 MED ORDER — FENTANYL CITRATE (PF) 100 MCG/2ML IJ SOLN
INTRAMUSCULAR | Status: DC | PRN
  Administered 2024-03-28 (×2): 50 ug via INTRAVENOUS

## 2024-03-28 MED ORDER — HYDROMORPHONE HCL 1 MG/ML IJ SOLN: 1.0000 mg | INTRAMUSCULAR | Status: DC | PRN

## 2024-03-28 MED ORDER — CLOPIDOGREL BISULFATE 75 MG PO TABS
75.0000 mg | ORAL_TABLET | Freq: Every day | ORAL | Status: DC
  Administered 2024-03-29 – 2024-03-31 (×3): 75 mg via ORAL
  Filled 2024-03-28 (×3): qty 1

## 2024-03-28 MED ORDER — HEPARIN SODIUM (PORCINE) 1000 UNIT/ML IJ SOLN
INTRAMUSCULAR | Status: DC | PRN
  Administered 2024-03-28: 9000 [IU] via INTRAVENOUS

## 2024-03-28 MED ORDER — METOCLOPRAMIDE HCL 5 MG/ML IJ SOLN
10.0000 mg | Freq: Once | INTRAMUSCULAR | Status: AC
  Administered 2024-03-28: 10 mg via INTRAVENOUS
  Filled 2024-03-28: qty 2

## 2024-03-28 MED ORDER — LIDOCAINE HCL (PF) 1 % IJ SOLN
INTRAMUSCULAR | Status: DC | PRN
  Administered 2024-03-28 (×2): 10 mL

## 2024-03-28 MED ORDER — CLOPIDOGREL BISULFATE 300 MG PO TABS: ORAL_TABLET | ORAL | Status: DC | PRN

## 2024-03-28 MED ORDER — CLOPIDOGREL BISULFATE 300 MG PO TABS
ORAL_TABLET | ORAL | Status: AC
  Filled 2024-03-28: qty 1

## 2024-03-28 MED ORDER — MIDAZOLAM HCL 2 MG/2ML IJ SOLN
INTRAMUSCULAR | Status: AC
  Filled 2024-03-28: qty 2

## 2024-03-28 MED ORDER — HYDRALAZINE HCL 20 MG/ML IJ SOLN
INTRAMUSCULAR | Status: AC
  Filled 2024-03-28: qty 1

## 2024-03-28 MED ORDER — LIDOCAINE HCL (PF) 1 % IJ SOLN
INTRAMUSCULAR | Status: AC
  Filled 2024-03-28: qty 30

## 2024-03-28 MED ORDER — LABETALOL HCL 5 MG/ML IV SOLN
INTRAVENOUS | Status: AC
Start: 2024-03-28 — End: 2024-03-28
  Administered 2024-03-28: 10 mg via INTRAVENOUS
  Filled 2024-03-28: qty 4

## 2024-03-28 MED ORDER — HYDRALAZINE HCL 20 MG/ML IJ SOLN
INTRAMUSCULAR | Status: DC | PRN
Start: 2024-03-28 — End: 2024-03-28
  Administered 2024-03-28: 20 mg via INTRAVENOUS

## 2024-03-28 MED ORDER — NITROGLYCERIN 1 MG/10 ML FOR IR/CATH LAB
INTRA_ARTERIAL | Status: AC
Start: 2024-03-28 — End: 2024-03-29
  Filled 2024-03-28: qty 10

## 2024-03-28 MED ORDER — INSULIN GLARGINE-YFGN 100 UNIT/ML ~~LOC~~ SOLN
25.0000 [IU] | Freq: Every day | SUBCUTANEOUS | Status: DC
  Administered 2024-03-29: 25 [IU] via SUBCUTANEOUS
  Filled 2024-03-28 (×2): qty 0.25

## 2024-03-28 MED ORDER — NITROGLYCERIN 1 MG/10 ML FOR IR/CATH LAB
INTRA_ARTERIAL | Status: DC | PRN
  Administered 2024-03-28: 200 ug via INTRA_ARTERIAL

## 2024-03-28 MED ORDER — PROTAMINE SULFATE 10 MG/ML IV SOLN
INTRAVENOUS | Status: AC
  Filled 2024-03-28: qty 5

## 2024-03-28 MED ORDER — LABETALOL HCL 5 MG/ML IV SOLN: 10.0000 mg | INTRAVENOUS | Status: DC | PRN

## 2024-03-28 MED ORDER — SODIUM CHLORIDE 0.9% FLUSH: 3.0000 mL | INTRAVENOUS | Status: DC | PRN

## 2024-03-28 MED ORDER — SODIUM CHLORIDE 0.9% FLUSH
3.0000 mL | Freq: Two times a day (BID) | INTRAVENOUS | Status: DC
  Administered 2024-03-28 – 2024-03-31 (×5): 3 mL via INTRAVENOUS

## 2024-03-28 MED ORDER — MIDAZOLAM HCL 2 MG/2ML IJ SOLN
INTRAMUSCULAR | Status: DC | PRN
  Administered 2024-03-28 (×2): 1 mg via INTRAVENOUS

## 2024-03-28 MED ORDER — GUAIFENESIN-DM 100-10 MG/5ML PO SYRP
5.0000 mL | ORAL_SOLUTION | ORAL | Status: DC | PRN
Start: 2024-03-28 — End: 2024-03-31
  Filled 2024-03-28: qty 5

## 2024-03-28 MED ORDER — ONDANSETRON HCL 4 MG/2ML IJ SOLN
INTRAMUSCULAR | Status: AC
  Filled 2024-03-28: qty 2

## 2024-03-28 MED ORDER — SODIUM CHLORIDE 0.9 % WEIGHT BASED INFUSION: 1.0000 mL/kg/h | INTRAVENOUS | Status: AC

## 2024-03-28 MED ORDER — SODIUM CHLORIDE 0.9 % IV SOLN: 250.0000 mL | INTRAVENOUS | Status: AC | PRN

## 2024-03-28 MED ORDER — ACETAMINOPHEN 325 MG PO TABS: 650.0000 mg | ORAL_TABLET | ORAL | Status: DC | PRN

## 2024-03-28 MED ORDER — PROTAMINE SULFATE 10 MG/ML IV SOLN
INTRAVENOUS | Status: DC | PRN
  Administered 2024-03-28: 5 mg via INTRAVENOUS
  Administered 2024-03-28: 35 mg via INTRAVENOUS

## 2024-03-28 SURGICAL SUPPLY — 26 items
BALLN STERLING OTW 3X40X150 (BALLOONS) ×2 IMPLANT
BALLOON STERLING OTW 3X40X150 (BALLOONS) IMPLANT
CATH CXI 2.3F 135 ANG 2 (CATHETERS) IMPLANT
CATH CXI SUPP 2.6F 135 ST (CATHETERS) IMPLANT
CATH NAVICROSS ANGLED 135CM (MICROCATHETER) IMPLANT
CATH OMNI FLUSH 5F 65CM (CATHETERS) IMPLANT
CATH QUICKCROSS SUPP .035X90CM (MICROCATHETER) IMPLANT
CATH TEMPO AQUA 5F 100CM (CATHETERS) IMPLANT
CLOSURE MYNX CONTROL 5F (Vascular Products) IMPLANT
CLOSURE PERCLOSE PROSTYLE (VASCULAR PRODUCTS) IMPLANT
COVER DOME SNAP 22 D (MISCELLANEOUS) IMPLANT
DCB RANGER 4.0X60 135 (BALLOONS) IMPLANT
DEVICE TORQUE .025-.038 (MISCELLANEOUS) IMPLANT
GLIDEWIRE ADV .035X260CM (WIRE) IMPLANT
GUIDEWIRE ANGLED .035X260CM (WIRE) IMPLANT
KIT ENCORE 26 ADVANTAGE (KITS) IMPLANT
KIT MICROPUNCTURE NIT STIFF (SHEATH) IMPLANT
RANGER DCB 4.0X60 135 (BALLOONS) ×2 IMPLANT
SET ATX-X65L (MISCELLANEOUS) IMPLANT
SHEATH APTUS 6.5FR 45CM (SHEATH) IMPLANT
SHEATH CATAPULT 5F 45 MP (SHEATH) IMPLANT
SHEATH PINNACLE 5F 10CM (SHEATH) IMPLANT
SHEATH PROBE COVER 6X72 (BAG) IMPLANT
TRAY PV CATH (CUSTOM PROCEDURE TRAY) ×3 IMPLANT
WIRE BENTSON .035X145CM (WIRE) IMPLANT
WIRE HI TORQ COMMND ES.014X300 (WIRE) IMPLANT

## 2024-03-28 NOTE — Inpatient Diabetes Management (Signed)
 Inpatient Diabetes Program Recommendations  AACE/ADA: New Consensus Statement on Inpatient Glycemic Control (2015)  Target Ranges:  Prepandial:   less than 140 mg/dL      Peak postprandial:   less than 180 mg/dL (1-2 hours)      Critically ill patients:  140 - 180 mg/dL   Lab Results  Component Value Date   GLUCAP 237 (H) 03/28/2024   HGBA1C 9.2 (A) 02/18/2024    Review of Glycemic Control  Latest Reference Range & Units 03/27/24 06:25 03/27/24 12:20 03/27/24 17:33 03/27/24 21:17 03/28/24 06:36  Glucose-Capillary 70 - 99 mg/dL 295 (H) 621 (H) 308 (H) 268 (H) 237 (H)  (H): Data is abnormally high Diabetes history: Type 2 DM Outpatient Diabetes medications: Tresiba 65 units every day, Ozempic 1 mg qwk, Metformin 500 mg QD Current orders for Inpatient glycemic control: Novolog 0-20 units TID, Novolog 0-5 units at bedtime, Semglee 25 units QD  Inpatient Diabetes Program Recommendations:    Consider increasing Semglee 30 units every day.   Thanks, Lujean Rave, MSN, RNC-OB Diabetes Coordinator [phone] (8a-5p)

## 2024-03-28 NOTE — Progress Notes (Addendum)
                  Subjective:   Summary: 63 yo F with PMH of insulin dependent T2DM, COPD, CKD 2, L BKA in 2014, R TMA in 2024, admitted for osteomyelitis of R foot  Patient feeling well today. Rounding team saw her as she was being taken for angiogram. No acute distress  Objective:  Vital signs in last 24 hours: Vitals:   03/28/24 0836 03/28/24 0839 03/28/24 1049 03/28/24 1059  BP: 127/85     Pulse: 68     Resp: 17     Temp: 98.6 F (37 C)     TempSrc: Oral     SpO2: 100% 99% 90% (!) 89%  Weight:      Height:       Supplemental O2: Nasal Cannula SpO2: (!) 89 % O2 Flow Rate (L/min): 2 L/min  Physical Exam:  Constitutional: chronically ill appearing, no acute distress Full exam not completed as patient was being taken for angiography  Assessment/Plan:   Osteomyelitis of R fifth metatarsal  Patient remains afebrile with no significant leukocytosis - Angiography today with vascular surgery - Podiatry plans to operate tomorrow 4/8, NPO after midnight - Holding antibiotics pending wound cultures - Oxycodone 5-10mg  q4h prn for severe pain, Tylenol q6h prn for mild pain - Senna-docusate 1 tablet at bedtime prn  T2DM Continue semglee 20 units daily and SSI. Blood sugars have been elevated to mid 200s, however patient will likely be eating less due to NPO status today and tomorrow  Chronic stable conditions HLD, PAD: Continue home ASA 81mg , rosuvastatin 20mg  COPD: Continue home Anoro Ellipta Depression: Continue home duloxetine 60mg  GERD: Continue home protonix 40mg  Chronic back pain: continue home pregabalin 200mg  TID HTN: continue home amlodipine 10mg , metoprolol 100mg , spironolactone 50mg  Urinary incontinence: continue home myrbetriq 50mg   Diet: NPO VTE: Heparin Code: Full  Dispo: Anticipated discharge to Home pending medical stability and PT evaluation  Monna Fam, MD PGY-1 Internal Medicine Resident Pager Number [phone] Please contact the on call pager  after 5 pm and on weekends at [phone].

## 2024-03-28 NOTE — Plan of Care (Signed)
  Problem: Health Behavior/Discharge Planning: Goal: Ability to manage health-related needs will improve Outcome: Progressing   Problem: Activity: Goal: Risk for activity intolerance will decrease Outcome: Progressing   Problem: Pain Managment: Goal: General experience of comfort will improve and/or be controlled Outcome: Progressing   Problem: Safety: Goal: Ability to remain free from injury will improve Outcome: Progressing

## 2024-03-28 NOTE — Plan of Care (Signed)
 N.p.o. past midnight for OR tomorrow morning for right foot I&D, partial fifth metatarsal resection, antibiotic beads, wound VAC.        Corinna Gab, DPM [facility] / [facility]                   03/28/2024

## 2024-03-28 NOTE — Progress Notes (Signed)
  Progress Note    03/28/2024 8:07 AM * No surgery found *  Right TMA wound Scheduled for Aortogram, Arteriogram of RLE in cath lab this morning with Dr.Buckley She did not have any questions regarding her procedure today Keep NPO Consent ordered  Graceann Congress, PA-C [facility] [phone] 03/28/2024 8:07 AM

## 2024-03-28 NOTE — Op Note (Signed)
 Patient name: Jennifer [name] MRN: [id number] DOB: 23-Jun-1961 Sex: female  03/24/2024 - 03/28/2024 Pre-operative Diagnosis: CL TI with a right TMA wound Post-operative diagnosis:  Same Surgeon:  Daria Pastures, MD Procedure Performed:  Ultrasound-guided access of left common femoral artery Ultrasound-guided access of right SFA, antegrade Aortogram right lower extremity angiogram Third order cannulation of right AT Third order cannulation of right PT Balloon angioplasty of right TP trunk, 3 mm Sterling Drug-coated balloon angioplasty of right TP trunk, 4 mm x 60 mm Ranger Mynx closure of right SFA access Pro-glide closure of left common femoral artery access 122 minutes moderate sedation with fentanyl and Versed   Indications: Jennifer [name] is a 63 year old female with type 2 diabetes and PAD.  She has undergone a left below-knee amputation in the past.  She also has a right TMA with a wound on the lateral aspect and osteomyelitis.  Noninvasive vascular studies were obtained which demonstrated arterial insufficiency.  Angiogram was offered, risk and benefits reviewed, she expressed understanding and elected to proceed.  Findings:  Widely patent aorta and bilateral renal arteries.  Widely patent common iliac stents bilaterally.  Widely patent external iliacs bilaterally.  Right common femoral artery, SFA and profunda widely patent.  Widely patent popliteal artery.  There is an approximate 70% stenosis of the bifurcation of the AT and TP trunk with atherosclerotic disease causing about 50% stenosis of the mid TP trunk.  There is two-vessel runoff via the AT and PT.  There is poor outflow via the DP although is patent.   Procedure:  The patient was identified in the holding area and taken to the cath lab  The patient was then placed supine on the table and prepped and draped in the usual sterile fashion.  A time out was called.  Ultrasound was used to evaluate the left common femoral artery.  It  was patent .  A digital ultrasound image was acquired.  A micropuncture needle was used to access the left common femoral artery under ultrasound guidance.  An 018 wire was advanced without resistance and a micropuncture sheath was placed.  The 018 wire was removed and a benson wire was placed.  The micropuncture sheath was exchanged for a 5 french sheath.  An omniflush catheter was advanced over the wire to the level of L-1.  An abdominal angiogram was obtained.  Due to the previously placed common iliac stents I was unable to cross the aortic bifurcation with an Omni Flush catheter and a glide advantage wire.  The left common femoral artery access was upsized to a 6.5 [place] tour guide and the patient was systemically heparinized.  Using a floppy Glidewire and the conformable sheath I was able to place a quick cross catheter into the right external iliac.  Right lower extremity angiogram was then obtained with the above findings.  I attempted multiple different wires and catheters to cross into the SFA and treat from an open over access although given the tortuosity and stiffness due to the previously placed, iliac stents I was unable to track anything further than the right common femoral artery.  Therefore I then elected to antegrade access the right groin.  Ultrasound was used to evaluate the right common femoral and SFA.  The proximal SFA was noted to be compressible and this was then accessed under ultrasound guidance with the micro puncture kit.  This access was then upsized to a 5 [place] sheath.  Using an 014 command wire and a  CXI catheter I was able to cannulate the AT.  A selective shot of the AT was performed which demonstrated wide patency without stenosis although poor outflow.  The wire and cath were then placed further down into the DP at the foot and an angiogram demonstrated patency of the DP but with sluggish flow in the pedal vessels.  The wire and cath were then pulled back and the right PT was  then cannulated, crossing the TP trunk lesions.  An angiogram via the catheter demonstrated true lumen crossing.  The TP trunk was then treated with a 3 mm Sterling followed by a 4 mm x 60 mm drug-coated Ranger balloon.  Completion angiography demonstrated wide patency with minimal residual stenosis of the treated segment.  There appeared to be spasm in the distal PT on the foot and therefore 200 mcg of nitro were given directly through the catheter which mildly reduced the spasm.  The wire and catheter were removed and a minx closure device was deployed under fluoroscopic guidance in the right groin access.  The 6-1/2 [place] tour guide was then tracked up and formed in the aorta and angiogram from the sheath was performed to evaluate the right groin.  There appeared to be a slight blush but no significant injury or pseudoaneurysm.  An additional 5 minutes of pressure was held and protamine was administered.  The left common femoral artery access was then closed with a Pro-glide closure device with excellent hemostasis.  Contrast: 75 cc Sedation: 122 minutes  Impression: Maximally revascularized with inline flow to the foot via the PT and AT.  Moderately diseased pedal vasculature with poor outflow of the AT.   Daria Pastures MD [facility] [place] Office: [phone]

## 2024-03-28 NOTE — Progress Notes (Signed)
 Presents with Osteomyelitis of R foot, hx of  uncontrolled DM2, L BKA ,R TMA, COPD, CKD2  and smoker.    03/28/24 1016  TOC Brief Assessment  Insurance and Status Reviewed  Patient has primary care physician Yes  Home environment has been reviewed From home with family.  Prior level of function: States PTA  independent with ADL's. Owns rollator.  Prior/Current Home Services No current home services  Social Drivers of Health Review SDOH reviewed needs interventions (smoker)  Readmission risk has been reviewed No  Transition of care needs transition of care needs identified, TOC will continue to follow   Per podiatry,  angio on today. Plan for surgery on the right foot possibly Tuesday.  Wound debridement, excision with 5th metatarsal head.  TOC team following and will assist with needs. Gae Gallop RN,BSN,CM [phone]

## 2024-03-29 ENCOUNTER — Inpatient Hospital Stay (HOSPITAL_COMMUNITY)

## 2024-03-29 ENCOUNTER — Inpatient Hospital Stay (HOSPITAL_COMMUNITY): Admitting: Anesthesiology

## 2024-03-29 ENCOUNTER — Encounter (HOSPITAL_COMMUNITY): Payer: Self-pay | Admitting: Vascular Surgery

## 2024-03-29 ENCOUNTER — Encounter (HOSPITAL_COMMUNITY)
Admission: EM | Disposition: A | Discharge status: Home / Self Care | Payer: Self-pay | Source: Home / Self Care | Attending: Infectious Diseases

## 2024-03-29 DIAGNOSIS — M86271 Subacute osteomyelitis, right ankle and foot: Secondary | ICD-10-CM | POA: Diagnosis not present

## 2024-03-29 DIAGNOSIS — F17210 Nicotine dependence, cigarettes, uncomplicated: Secondary | ICD-10-CM | POA: Diagnosis not present

## 2024-03-29 DIAGNOSIS — M869 Osteomyelitis, unspecified: Secondary | ICD-10-CM

## 2024-03-29 DIAGNOSIS — E1169 Type 2 diabetes mellitus with other specified complication: Secondary | ICD-10-CM

## 2024-03-29 DIAGNOSIS — J449 Chronic obstructive pulmonary disease, unspecified: Secondary | ICD-10-CM

## 2024-03-29 DIAGNOSIS — M86171 Other acute osteomyelitis, right ankle and foot: Secondary | ICD-10-CM | POA: Diagnosis not present

## 2024-03-29 HISTORY — PX: METATARSAL HEAD EXCISION: SHX5027

## 2024-03-29 LAB — BASIC METABOLIC PANEL WITH GFR
Anion gap: 11 (ref 5–15)
BUN: 8 mg/dL (ref 8–23)
CO2: 27 mmol/L (ref 22–32)
Calcium: 8.7 mg/dL — ABNORMAL LOW (ref 8.9–10.3)
Chloride: 97 mmol/L — ABNORMAL LOW (ref 98–111)
Creatinine, Ser: 0.82 mg/dL (ref 0.44–1.00)
GFR, Estimated: >60 mL/min (ref >60)
Glucose, Bld: 186 mg/dL — ABNORMAL HIGH (ref 70–99)
Potassium: 4.4 mmol/L (ref 3.5–5.1)
Sodium: 135 mmol/L (ref 135–145)

## 2024-03-29 LAB — LIPID PANEL
Cholesterol: 85 mg/dL (ref 0–200)
HDL: 30 mg/dL — ABNORMAL LOW (ref >40)
LDL Cholesterol: 41 mg/dL (ref 0–99)
Total CHOL/HDL Ratio: 2.8 ratio
Triglycerides: 72 mg/dL (ref <150)
VLDL: 14 mg/dL (ref 0–40)

## 2024-03-29 LAB — CULTURE, BLOOD (ROUTINE X 2)
Culture: NO GROWTH
Culture: NO GROWTH

## 2024-03-29 LAB — CBC
HCT: 38.3 % (ref 36.0–46.0)
Hemoglobin: 12.3 g/dL (ref 12.0–15.0)
MCH: 30.2 pg (ref 26.0–34.0)
MCHC: 32.1 g/dL (ref 30.0–36.0)
MCV: 94.1 fL (ref 80.0–100.0)
Platelets: 224 10*3/uL (ref 150–400)
RBC: 4.07 MIL/uL (ref 3.87–5.11)
RDW: 13.5 % (ref 11.5–15.5)
WBC: 9.4 10*3/uL (ref 4.0–10.5)
nRBC: 0 % (ref 0.0–0.2)

## 2024-03-29 LAB — GLUCOSE, CAPILLARY
Glucose-Capillary: 255 mg/dL — ABNORMAL HIGH (ref 70–99)
Glucose-Capillary: 169 mg/dL — ABNORMAL HIGH (ref 70–99)
Glucose-Capillary: 206 mg/dL — ABNORMAL HIGH (ref 70–99)
Glucose-Capillary: 184 mg/dL — ABNORMAL HIGH (ref 70–99)
Glucose-Capillary: 160 mg/dL — ABNORMAL HIGH (ref 70–99)
Glucose-Capillary: 217 mg/dL — ABNORMAL HIGH (ref 70–99)

## 2024-03-29 SURGERY — EXCISION, METATARSAL BONE, HEAD
Anesthesia: Monitor Anesthesia Care | Site: Toe | Laterality: Right

## 2024-03-29 MED ORDER — 0.9 % SODIUM CHLORIDE (POUR BTL) OPTIME
TOPICAL | Status: DC | PRN
  Administered 2024-03-29: 1000 mL

## 2024-03-29 MED ORDER — POLYETHYLENE GLYCOL 3350 17 G PO PACK
17.0000 g | PACK | Freq: Every day | ORAL | Status: DC
  Administered 2024-03-29: 17 g via ORAL
  Filled 2024-03-29 (×3): qty 1

## 2024-03-29 MED ORDER — VANCOMYCIN HCL 1000 MG IV SOLR
INTRAVENOUS | Status: DC | PRN
  Administered 2024-03-29: 1000 mg via INTRAVENOUS

## 2024-03-29 MED ORDER — INSULIN ASPART 100 UNIT/ML IJ SOLN
0.0000 [IU] | INTRAMUSCULAR | Status: DC | PRN
  Administered 2024-03-29: 2 [IU] via SUBCUTANEOUS
  Filled 2024-03-29: qty 1

## 2024-03-29 MED ORDER — MIDAZOLAM HCL 2 MG/2ML IJ SOLN
INTRAMUSCULAR | Status: DC | PRN
  Administered 2024-03-29: 2 mg via INTRAVENOUS

## 2024-03-29 MED ORDER — FENTANYL CITRATE (PF) 100 MCG/2ML IJ SOLN: 25.0000 ug | INTRAMUSCULAR | Status: DC | PRN

## 2024-03-29 MED ORDER — TOBRAMYCIN SULFATE 80 MG/2ML IJ SOLN
INTRAMUSCULAR | Status: AC
  Filled 2024-03-29: qty 4

## 2024-03-29 MED ORDER — FENTANYL CITRATE (PF) 250 MCG/5ML IJ SOLN
INTRAMUSCULAR | Status: AC
Start: 2024-03-29 — End: ?
  Filled 2024-03-29: qty 5

## 2024-03-29 MED ORDER — CHLORHEXIDINE GLUCONATE 0.12 % MT SOLN
OROMUCOSAL | Status: AC
  Administered 2024-03-29: 15 mL
  Filled 2024-03-29: qty 15

## 2024-03-29 MED ORDER — BUPIVACAINE HCL (PF) 0.5 % IJ SOLN
INTRAMUSCULAR | Status: DC | PRN
  Administered 2024-03-29: 5 mL

## 2024-03-29 MED ORDER — PROPOFOL 500 MG/50ML IV EMUL
INTRAVENOUS | Status: DC | PRN
  Administered 2024-03-29: 75 ug/kg/min via INTRAVENOUS

## 2024-03-29 MED ORDER — BUPIVACAINE HCL (PF) 0.5 % IJ SOLN
INTRAMUSCULAR | Status: AC
  Filled 2024-03-29: qty 30

## 2024-03-29 MED ORDER — METOPROLOL SUCCINATE ER 25 MG PO TB24
ORAL_TABLET | ORAL | Status: AC
  Filled 2024-03-29: qty 4

## 2024-03-29 MED ORDER — LIDOCAINE 2% (20 MG/ML) 5 ML SYRINGE
INTRAMUSCULAR | Status: AC
  Filled 2024-03-29: qty 5

## 2024-03-29 MED ORDER — VANCOMYCIN HCL 500 MG IV SOLR
INTRAVENOUS | Status: AC
  Filled 2024-03-29: qty 10

## 2024-03-29 MED ORDER — CHLORHEXIDINE GLUCONATE 0.12 % MT SOLN: 15.0000 mL | Freq: Once | OROMUCOSAL | Status: AC

## 2024-03-29 MED ORDER — FENTANYL CITRATE (PF) 250 MCG/5ML IJ SOLN
INTRAMUSCULAR | Status: DC | PRN
  Administered 2024-03-29: 50 ug via INTRAVENOUS

## 2024-03-29 MED ORDER — LIDOCAINE 2% (20 MG/ML) 5 ML SYRINGE
INTRAMUSCULAR | Status: DC | PRN
  Administered 2024-03-29: 50 mg via INTRAVENOUS

## 2024-03-29 MED ORDER — LIDOCAINE HCL (PF) 1 % IJ SOLN
INTRAMUSCULAR | Status: DC | PRN
  Administered 2024-03-29: 5 mL

## 2024-03-29 MED ORDER — HYDROMORPHONE HCL 1 MG/ML IJ SOLN: 1.0000 mg | INTRAMUSCULAR | Status: DC | PRN

## 2024-03-29 MED ORDER — TOBRAMYCIN SULFATE 80 MG/2ML IJ SOLN
INTRAMUSCULAR | Status: DC | PRN
  Administered 2024-03-29: 80 mg via INTRAMUSCULAR

## 2024-03-29 MED ORDER — SODIUM CHLORIDE 0.9 % IR SOLN
Status: DC | PRN
  Administered 2024-03-29: 3000 mL

## 2024-03-29 MED ORDER — DROPERIDOL 2.5 MG/ML IJ SOLN: 0.6250 mg | Freq: Once | INTRAMUSCULAR | Status: DC | PRN

## 2024-03-29 MED ORDER — LACTATED RINGERS IV SOLN: INTRAVENOUS | Status: DC

## 2024-03-29 MED ORDER — INSULIN GLARGINE-YFGN 100 UNIT/ML ~~LOC~~ SOLN
30.0000 [IU] | Freq: Every day | SUBCUTANEOUS | Status: DC
  Administered 2024-03-30 – 2024-03-31 (×2): 30 [IU] via SUBCUTANEOUS
  Filled 2024-03-29 (×2): qty 0.3

## 2024-03-29 MED ORDER — VANCOMYCIN HCL 500 MG IV SOLR
INTRAVENOUS | Status: DC | PRN
  Administered 2024-03-29: 500 mg

## 2024-03-29 MED ORDER — MIDAZOLAM HCL 2 MG/2ML IJ SOLN
INTRAMUSCULAR | Status: AC
  Filled 2024-03-29: qty 2

## 2024-03-29 MED ORDER — ORAL CARE MOUTH RINSE
15.0000 mL | Freq: Once | OROMUCOSAL | Status: AC
  Administered 2024-03-29: 15 mL via OROMUCOSAL

## 2024-03-29 MED ORDER — LIDOCAINE HCL (PF) 1 % IJ SOLN
INTRAMUSCULAR | Status: AC
  Filled 2024-03-29: qty 30

## 2024-03-29 SURGICAL SUPPLY — 42 items
BEADS BIO ARTH CALC SULFAT 5CC (Bone Implant) IMPLANT
BIOBEADS ARTH CALC SULFATE 5CC (Bone Implant) ×1 IMPLANT
BLADE AVERAGE 25X9 (BLADE) IMPLANT
BLADE SURG 10 STRL SS (BLADE) ×2 IMPLANT
BLADE SURG 15 STRL LF DISP TIS (BLADE) ×2 IMPLANT
BNDG COHESIVE 3X5 TAN ST LF (GAUZE/BANDAGES/DRESSINGS) ×2 IMPLANT
BNDG ELASTIC 3INX 5YD STR LF (GAUZE/BANDAGES/DRESSINGS) ×2 IMPLANT
BNDG ELASTIC 4INX 5YD STR LF (GAUZE/BANDAGES/DRESSINGS) IMPLANT
BNDG ESMARK 4X9 LF (GAUZE/BANDAGES/DRESSINGS) ×2 IMPLANT
BNDG GAUZE DERMACEA FLUFF 4 (GAUZE/BANDAGES/DRESSINGS) IMPLANT
CHLORAPREP W/TINT 26 (MISCELLANEOUS) IMPLANT
DRSG ADAPTIC 3X8 NADH LF (GAUZE/BANDAGES/DRESSINGS) IMPLANT
DRSG XEROFORM 1X8 (GAUZE/BANDAGES/DRESSINGS) IMPLANT
ELECT REM PT RETURN 9FT ADLT (ELECTROSURGICAL) ×1 IMPLANT
ELECTRODE REM PT RTRN 9FT ADLT (ELECTROSURGICAL) ×2 IMPLANT
GAUZE PAD ABD 8X10 STRL (GAUZE/BANDAGES/DRESSINGS) IMPLANT
GAUZE SPONGE 2X2 STRL 8-PLY (GAUZE/BANDAGES/DRESSINGS) IMPLANT
GAUZE SPONGE 4X4 12PLY STRL (GAUZE/BANDAGES/DRESSINGS) ×2 IMPLANT
GAUZE STRETCH 2X75IN STRL (MISCELLANEOUS) ×2 IMPLANT
GAUZE XEROFORM 1X8 LF (GAUZE/BANDAGES/DRESSINGS) ×2 IMPLANT
GAUZE XEROFORM 5X9 LF (GAUZE/BANDAGES/DRESSINGS) IMPLANT
GLOVE BIO SURGEON STRL SZ7.5 (GLOVE) ×2 IMPLANT
GLOVE BIOGEL PI IND STRL 7.5 (GLOVE) ×2 IMPLANT
GOWN STRL REUS W/ TWL LRG LVL3 (GOWN DISPOSABLE) ×4 IMPLANT
KIT BASIN OR (CUSTOM PROCEDURE TRAY) ×2 IMPLANT
NDL BIOPSY JAMSHIDI 8X6 (NEEDLE) IMPLANT
NDL HYPO 25X1 1.5 SAFETY (NEEDLE) ×2 IMPLANT
NEEDLE BIOPSY JAMSHIDI 8X6 (NEEDLE) IMPLANT
NEEDLE HYPO 25X1 1.5 SAFETY (NEEDLE) ×1 IMPLANT
PACK ORTHO EXTREMITY (CUSTOM PROCEDURE TRAY) ×2 IMPLANT
PADDING CAST ABS COTTON 4X4 ST (CAST SUPPLIES) ×4 IMPLANT
SET HNDPC FAN SPRY TIP SCT (DISPOSABLE) IMPLANT
SPIKE FLUID TRANSFER (MISCELLANEOUS) IMPLANT
STOCKINETTE 4X48 STRL (DRAPES) IMPLANT
SUT ETHILON 3 0 FSLX (SUTURE) IMPLANT
SUT PROLENE 3 0 PS 2 (SUTURE) IMPLANT
SUT PROLENE 4 0 PS 2 18 (SUTURE) IMPLANT
SYR CONTROL 10ML LL (SYRINGE) ×2 IMPLANT
TUBE CONNECTING 12X1/4 (SUCTIONS) IMPLANT
UNDERPAD 30X36 HEAVY ABSORB (UNDERPADS AND DIAPERS) ×2 IMPLANT
WATER STERILE IRR 1000ML POUR (IV SOLUTION) ×2 IMPLANT
YANKAUER SUCT BULB TIP NO VENT (SUCTIONS) IMPLANT

## 2024-03-29 NOTE — Transfer of Care (Signed)
 Immediate Anesthesia Transfer of Care Note  Patient: Jennifer [name]  Procedure(s) Performed: EXCISION, METATARSAL BONE, HEAD (Right: Toe)  Patient Location: PACU  Anesthesia Type:MAC  Level of Consciousness: awake and alert   Airway & Oxygen Therapy: Patient Spontanous Breathing and Patient connected to nasal cannula oxygen  Post-op Assessment: Report given to RN and Post -op Vital signs reviewed and stable  Post vital signs: Reviewed and stable  Last Vitals:  Vitals Value Taken Time  BP 133/65 03/29/24 0932  Temp 97.8   Pulse 67 03/29/24 0935  Resp 11 03/29/24 0935  SpO2 93 % 03/29/24 0935  Vitals shown include unfiled device data.  Last Pain:  Vitals:   03/29/24 0813  TempSrc:   PainSc: 8       Patients Stated Pain Goal: 2 (03/27/24 2230)  Complications: No notable events documented.

## 2024-03-29 NOTE — Progress Notes (Signed)
  Progress Note    03/29/2024 10:52 AM * Day of Surgery *  Subjective:  still a little lethargic after surgery   Vitals:   03/29/24 1000 03/29/24 1038  BP: 124/64 134/64  Pulse: 64 62  Resp: 13 14  Temp: 98.1 F (36.7 C) 98 F (36.7 C)  SpO2: 94%    Physical Exam: Cardiac:  regular Lungs:  non labored Extremities:  Doppler AT/ PT signals. Right foot dressed. VAC on right foot. Left common femoral access site soft without swelling or hematoma  Neurologic: alert   CBC    Component Value Date/Time   WBC 9.4 03/29/2024 0456   RBC 4.07 03/29/2024 0456   HGB 12.3 03/29/2024 0456   HGB 12.2 09/01/2022 1000   HCT 38.3 03/29/2024 0456   HCT 38.5 09/01/2022 1000   PLT 224 03/29/2024 0456   PLT 175 09/01/2022 1000   MCV 94.1 03/29/2024 0456   MCV 93 09/01/2022 1000   MCH 30.2 03/29/2024 0456   MCHC 32.1 03/29/2024 0456   RDW 13.5 03/29/2024 0456   RDW 14.4 09/01/2022 1000   LYMPHSABS 2.4 03/24/2024 1530   LYMPHSABS 2.7 09/01/2022 1000   MONOABS 0.5 03/24/2024 1530   EOSABS 0.1 03/24/2024 1530   EOSABS 0.1 09/01/2022 1000   BASOSABS 0.0 03/24/2024 1530   BASOSABS 0.1 09/01/2022 1000    BMET    Component Value Date/Time   NA 135 03/29/2024 0456   NA 141 02/18/2024 0932   K 4.4 03/29/2024 0456   CL 97 (L) 03/29/2024 0456   CO2 27 03/29/2024 0456   GLUCOSE 186 (H) 03/29/2024 0456   BUN 8 03/29/2024 0456   BUN 10 02/18/2024 0932   CREATININE 0.82 03/29/2024 0456   CREATININE 0.68 01/31/2015 1641   CALCIUM 8.7 (L) 03/29/2024 0456   GFRNONAA >60 03/29/2024 0456   GFRNONAA >89 01/31/2015 1641   GFRAA 54 (L) 10/25/2020 1131   GFRAA >89 01/31/2015 1641    INR    Component Value Date/Time   INR 1.1 10/31/2023 1539     Intake/Output Summary (Last 24 hours) at 03/29/2024 1052 Last data filed at 03/29/2024 1000 Gross per 24 hour  Intake 990 ml  Output 955 ml  Net 35 ml     Assessment/Plan:  63 y.o. female is s/p Aortogram, arteriogram balloon angioplasty of  right TPT trunk, DCB angioplasty of right TP trunk 1 Day Post op  Just out of OR this morning with podiatry. Right foot management per Dr. Annamary Rummage Left common femoral access site without swelling or hematoma  Maximally revascularized on RLE Aspirin, Statin, Plavix She will follow up in 1 month with ABI and RLE arterial duplex   Graceann Congress, PA-C [facility] [phone] 03/29/2024 10:52 AM

## 2024-03-29 NOTE — Progress Notes (Signed)
 History and Physical Interval Note:  03/29/2024 8:24 AM  Jennifer [name]  has presented today for surgery, with the diagnosis of right foot 5th metatarsal osteomyelitis, gangrenous wound, prior right foot TMA.  The various methods of treatment have been discussed with the patient and family. After consideration of risks, benefits and other options for treatment, the patient has consented to   Procedure(s) with comments: EXCISION, METATARSAL BONE, HEAD (Right) - I&D right foot with partial 5th met resection, abx beads, wound vac as a surgical intervention.  The patient's history has been reviewed, patient examined, no change in status, stable for surgery.  I have reviewed the patient's chart and labs.  Questions were answered to the patient's satisfaction.     [name]

## 2024-03-29 NOTE — Progress Notes (Signed)
 PHARMACIST LIPID MONITORING   Jennifer [name] is a 63 y.o. female admitted on 03/24/2024 with PVD.  Pharmacy has been consulted to optimize lipid-lowering therapy with the indication of secondary prevention for clinical ASCVD.  Recent Labs:  Lipid Panel (last 6 months):   Lab Results  Component Value Date   CHOL 85 03/29/2024   TRIG 72 03/29/2024   HDL 30 (L) 03/29/2024   CHOLHDL 2.8 03/29/2024   VLDL 14 03/29/2024   LDLCALC 41 03/29/2024    Hepatic function panel (last 6 months):   Lab Results  Component Value Date   AST 12 (L) 03/28/2024   ALT 10 03/28/2024   ALKPHOS 60 03/28/2024   BILITOT <0.2 03/28/2024    SCr (since admission):   Serum creatinine: 0.82 mg/dL 32/35/57 3220 Estimated creatinine clearance: 80 mL/min  Current therapy and lipid therapy tolerance Current lipid-lowering therapy: Rosuvastatin 20mg  Previous lipid-lowering therapies (if applicable):  Documented or reported allergies or intolerances to lipid-lowering therapies (if applicable):   Assessment:   Patient prefers no changes in lipid-lowering therapy at this time due to LDL already below goal  Plan:    1.Statin intensity (high intensity recommended for all patients regardless of the LDL):  No statin changes. The patient is already on a high intensity statin.  2.Add ezetimibe (if any one of the following):   Not indicated at this time.  3.Refer to lipid clinic:   No  4.Follow-up with:  Primary care provider - Miguel Aschoff, MD  5.Follow-up labs after discharge:  No changes in lipid therapy, repeat a lipid panel in one year.      Ulyses Southward, PharmD, BCIDP, AAHIVP, CPP Infectious Disease Pharmacist 03/29/2024 11:00 AM

## 2024-03-29 NOTE — Op Note (Signed)
 Full Operative Report  Date of Operation: 9:39 AM, 03/29/2024   Patient: Jennifer [name] - 63 y.o. female  Surgeon: [facility], DPM   Assistant: None  Diagnosis: Osteomyelitis of right foot  Procedure:  1.  Irrigation debridement with fifth metatarsal resection, right foot 2.  Application dissolvable calcium sulfate antibiotic beads, right foot 3.  Wound VAC application, 6 x 4 cm, right foot    Anesthesia: Monitor Anesthesia Care  No responsible provider has been recorded for the case.  Anesthesiologist: Marcene Duos, MD CRNA: Colbert Coyer, CRNA; Randon Goldsmith, CRNA   Estimated Blood Loss: Minimal   Hemostasis: 1) Anatomical dissection, mechanical compression, electrocautery 2) no tourniquet was used during procedur  Implants: Implant Name Type Inv. Item Serial No. Manufacturer Lot No. LRB No. Used Action  BIOBEADS ARTH CALC SULFATE 5CC - [id number] Bone Implant BIOBEADS ARTH CALC SULFATE 5CC  [id number] Right 1 Implanted    Materials: Wound VAC black sponge  Injectables: 1) Pre-operatively: 10 cc  cc of 50:50 mixture 1%lidocaine plain and 0.5% marcaine plain 2) Post-operatively: None   Specimens: Pathology: 5th metatarsal bone for path,  Microbiology: bone culture distal 5th met, tissue culture ulceration of right tma   Antibiotics: IV antibiotics including 1 gm vancomycin given after cultures obtained  Drains: None  Complications: Patient tolerated the procedure well without complication.   Operative findings: As below in detailed report  Indications for Procedure: NECHA HARRIES presents to [facility], [place] with a chief complaint of necrotic ulceration distal lateral aspect of right foot TMA, underlying of osteomyelitis of right 5th met remainder. The patient has failed conservative treatments of various modalities. At this time the patient has elected to proceed with surgical correction. All alternatives, risks, and  complications of the procedures were thoroughly explained to the patient. Patient exhibits appropriate understanding of all discussion points and informed consent was signed and obtained in the chart with no guarantees to surgical outcome given or implied.  Description of Procedure: Patient was brought to the operating room. Patient remained on their hospital bed in the supine position. A surgical timeout was performed and all members of the operating room, the procedure, and the surgical site were identified. anesthesia occurred as per anesthesia record. Local anesthetic as previously described was then injected about the operative field in a local infiltrative block.  The operative lower extremity as noted above was then prepped and draped in the usual sterile manner. The following procedure then began.  Attention was directed to the right foot necrotic ulceration at the distal lateral aspect TMA stump site. The wound was first excised full thickness with 10 blade. The wound did probe to 5th met stump. The ulceration was excised full thickness and sent for culture. The 5th metatarsal was then dissected with 15 blade medial and lateral. The incision was carried longitudinally on the lateral foot. The 5th metatarsal was noted to be necrotic distally. The base of the 5th met was then disarticulated at the TMTJ with 15 blade. The bone was removed and passed to back table. A piece of the distal 5th met stump was resected and sent for micro. The remainder of the bone was sent for pathology. Next the entire surgical site was irrigated with sterile saline via power pulse lavage 3 L. Hemostasis via electrocautery was then achieved.   Next antibiotic beads dissolvable calcium sulfate beads mixed with vancomycin and tobramycin were prepared on the back table. 5 cc of small beads were  prepared. These were then implanted in the surgical site.  Next decision was made to apply a wound vac given soft tissue deficit  distally at site of the excised ulcer.  The wound measured approximately 6 x 4 cm post debridement. I then cut black foam sponge to the size of the wound above. This was placed in the surgical site. Dermatec drape was then placed over the  black foam sponge. The wound vac was attached and set to 125 mm Hg continuous with no leaks detected.   The surgical site was then dressed with 4x4 kerlix and ace. The patient tolerated both the procedure and anesthesia well with vital signs stable throughout. The patient was transferred in good condition and all vital signs stable  from the OR to recovery under the discretion of anesthesia.  Condition: Vital signs stable, neurovascular status unchanged from preoperative   Surgical plan:  No further osseous infection expected s/p complete 5th met resection. Abx and wound vac applied. Plan for closure by secondary intent.  The patient will be WBAT in a  post op shoe to the operative limb until further instructed. The dressing is to remain clean, dry, and intact. Will continue to follow unless noted elsewhere.   Carlena Hurl, DPM [facility]

## 2024-03-29 NOTE — Progress Notes (Signed)
 Attempted to call for report on patient for surgery. No answer. Transport sent for patient.
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
## 2024-03-29 NOTE — Discharge Instructions (Signed)
   [facility]  Discharge Instructions  Lower Extremity Angiogram; Angioplasty/Stenting  Please refer to the following instructions for your post-procedure care. Your surgeon or physician assistant will discuss any changes with you.  Activity  Avoid lifting more than 8 pounds (1 gallons of milk) for 5 days after your procedure. You may walk as much as you can tolerate. It's OK to drive after 72 hours.  Bathing/Showering  You may shower the day after your procedure. If you have a bandage, you may remove it at 24- 48 hours. Clean your incision site with mild soap and water. Pat the area dry with a clean towel.  Diet  Resume your pre-procedure diet. There are no special food restrictions following this procedure. All patients with peripheral vascular disease should follow a low fat/low cholesterol diet. In order to heal from your surgery, it is CRITICAL to get adequate nutrition. Your body requires vitamins, minerals, and protein. Vegetables are the best source of vitamins and minerals. Vegetables also provide the perfect balance of protein. Processed food has little nutritional value, so try to avoid this.  Medications  Resume taking all of your medications unless your doctor tells you not to. If your incision is causing pain, you may take over-the-counter pain relievers such as acetaminophen (Tylenol)  Follow Up  Follow up will be arranged at the time of your procedure. You may have an office visit scheduled or may be scheduled for surgery. Ask your surgeon if you have any questions.  Please call us immediately for any of the following conditions: .Severe or worsening pain your legs or feet at rest or with walking. .Increased pain, redness, drainage at your groin puncture site. .Fever of 101 degrees or higher. .If you have any mild or slow bleeding from your puncture site: lie down, apply firm constant pressure over the area with a piece of gauze or a  clean wash cloth for 30 minutes- no peeking!, call 911 right away if you are still bleeding after 30 minutes, or if the bleeding is heavy and unmanageable.  Reduce your risk factors of vascular disease:  . Stop smoking. If you would like help call [facility] at 1-800-QUIT-NOW (228-025-3008) or [place] at 319-820-6853. . Manage your cholesterol . Maintain a desired weight . Control your diabetes . Keep your blood pressure down .  If you have any questions, please call the office at [phone]

## 2024-03-29 NOTE — Anesthesia Preprocedure Evaluation (Signed)
 Anesthesia Evaluation  Patient identified by MRN, date of birth, ID band Patient awake    Reviewed: Allergy & Precautions, NPO status , Patient's Chart, lab work & pertinent test results  Airway Mallampati: II  TM Distance: >3 FB Neck ROM: Full    Dental  (+) Dental Advisory Given   Pulmonary asthma , COPD, Current Smoker and Patient abstained from smoking.   breath sounds clear to auscultation       Cardiovascular hypertension, Pt. on medications and Pt. on home beta blockers + Peripheral Vascular Disease and +CHF   Rhythm:Regular Rate:Normal     Neuro/Psych  Neuromuscular disease    GI/Hepatic Neg liver ROS,GERD  ,,  Endo/Other  diabetes, Type 2    Renal/GU CRFRenal disease     Musculoskeletal  (+) Arthritis ,  Fibromyalgia -  Abdominal   Peds  Hematology negative hematology ROS (+)   Anesthesia Other Findings   Reproductive/Obstetrics                             Anesthesia Physical Anesthesia Plan  ASA: 3  Anesthesia Plan: MAC   Post-op Pain Management: Minimal or no pain anticipated   Induction:   PONV Risk Score and Plan: 1 and Propofol infusion  Airway Management Planned: Natural Airway and Simple Face Mask  Additional Equipment:   Intra-op Plan:   Post-operative Plan:   Informed Consent: I have reviewed the patients History and Physical, chart, labs and discussed the procedure including the risks, benefits and alternatives for the proposed anesthesia with the patient or authorized representative who has indicated his/her understanding and acceptance.       Plan Discussed with:   Anesthesia Plan Comments:        Anesthesia Quick Evaluation

## 2024-03-29 NOTE — Progress Notes (Signed)
                  Subjective:   Summary: 63 yo F with PMH of insulin dependent T2DM, COPD, CKD 2, L BKA in 2014, R TMA in 2024, admitted for osteomyelitis of R foot  Patient was in the OR this am  Objective:  Vital signs in last 24 hours: Vitals:   03/28/24 1722 03/28/24 2020 03/29/24 0047 03/29/24 0349  BP: (!) 157/66  (!) 126/48   Pulse: 72 86  80  Resp: 14  15 18   Temp: 98.2 F (36.8 C) 98 F (36.7 C) 97.8 F (36.6 C) 97.9 F (36.6 C)  TempSrc: Oral Oral Oral Oral  SpO2: 90%  92%   Weight:      Height:       Supplemental O2: Nasal Cannula SpO2: 92 % O2 Flow Rate (L/min): 2 L/min  Physical Exam:  Patient was in the OR during morning rounds  Assessment/Plan:   Osteomyelitis of R fifth metatarsal  Patient remains afebrile with no significant leukocytosis. S/p angiography on 4/8 with maximal revascularization performed - OR today with podiatry for I&D, partial resection - Holding antibiotics pending wound cultures - Oxycodone 5-10mg  q4h prn for severe pain, Tylenol q6h prn for mild pain - Senna-docusate 1 tablet at bedtime prn  T2DM Continue semglee 25 units daily and SSI. Blood sugars have been elevated to mid 200s, however patient will likely be eating less due to NPO status today and tomorrow. May increase long acting insulin tomorrow depending on CBG  Chronic stable conditions HLD, PAD: Continue home ASA 81mg , rosuvastatin 20mg  COPD: Continue home Anoro Ellipta Depression: Continue home duloxetine 60mg  GERD: Continue home protonix 40mg  Chronic back pain: continue home pregabalin 200mg  TID HTN: continue home amlodipine 10mg , metoprolol 100mg , spironolactone 50mg  Urinary incontinence: continue home myrbetriq 50mg   Diet: NPO VTE: Heparin Code: Full  Dispo: Anticipated discharge to Home pending medical stability and PT evaluation  Monna Fam, MD PGY-1 Internal Medicine Resident Pager Number [phone] Please contact the on call pager after 5 pm and on  weekends at [phone].

## 2024-03-29 NOTE — Progress Notes (Signed)
 Orthopedic Tech Progress Note Patient Details:  Jennifer [name] Apr 08, 1961 [id number]  Patient has POST OP SHOE   [name], female   DOB: 19-Apr-1961, 63 y.o.   MRN: [id number]  Donald Pore 03/29/2024, 9:56 AM

## 2024-03-30 ENCOUNTER — Encounter (HOSPITAL_COMMUNITY): Payer: Self-pay | Admitting: Podiatry

## 2024-03-30 DIAGNOSIS — M86171 Other acute osteomyelitis, right ankle and foot: Secondary | ICD-10-CM | POA: Diagnosis not present

## 2024-03-30 DIAGNOSIS — E1169 Type 2 diabetes mellitus with other specified complication: Secondary | ICD-10-CM | POA: Diagnosis not present

## 2024-03-30 LAB — CBC
HCT: 34.2 % — ABNORMAL LOW (ref 36.0–46.0)
Hemoglobin: 11.1 g/dL — ABNORMAL LOW (ref 12.0–15.0)
MCH: 30.6 pg (ref 26.0–34.0)
MCHC: 32.5 g/dL (ref 30.0–36.0)
MCV: 94.2 fL (ref 80.0–100.0)
Platelets: 231 10*3/uL (ref 150–400)
RBC: 3.63 MIL/uL — ABNORMAL LOW (ref 3.87–5.11)
RDW: 13.4 % (ref 11.5–15.5)
WBC: 12 10*3/uL — ABNORMAL HIGH (ref 4.0–10.5)
nRBC: 0 % (ref 0.0–0.2)

## 2024-03-30 LAB — GLUCOSE, CAPILLARY
Glucose-Capillary: 160 mg/dL — ABNORMAL HIGH (ref 70–99)
Glucose-Capillary: 378 mg/dL — ABNORMAL HIGH (ref 70–99)
Glucose-Capillary: 166 mg/dL — ABNORMAL HIGH (ref 70–99)
Glucose-Capillary: 217 mg/dL — ABNORMAL HIGH (ref 70–99)

## 2024-03-30 LAB — SURGICAL PATHOLOGY

## 2024-03-30 MED ORDER — MEDIHONEY WOUND/BURN DRESSING EX PSTE
1.0000 | PASTE | Freq: Every day | CUTANEOUS | Status: DC
  Administered 2024-03-30: 1 via TOPICAL
  Filled 2024-03-30: qty 44

## 2024-03-30 MED ORDER — HYDROCERIN EX CREA
TOPICAL_CREAM | Freq: Every day | CUTANEOUS | Status: DC
  Administered 2024-03-30: 1 via TOPICAL
  Filled 2024-03-30: qty 113

## 2024-03-30 NOTE — Progress Notes (Signed)
                  Subjective:   Summary: 63 yo F with PMH of insulin dependent T2DM, COPD, CKD 2, L BKA in 2014, R TMA in 2024, admitted for osteomyelitis of R foot  Patient feeling well this morning. Pain is well-controlled. She does not like the idea of going to a SNF and would like to return home when medically able. No new complaints.  Objective:  Vital signs in last 24 hours: Vitals:   03/29/24 1420 03/29/24 1422 03/29/24 2010 03/30/24 0845  BP: 135/71 135/71 (!) 139/57 121/60  Pulse: 68 68 64 71  Resp: 15 14 16    Temp: 98.4 F (36.9 C)  98.4 F (36.9 C)   TempSrc: Oral Oral Oral   SpO2: 100% 100%    Weight:      Height:       Supplemental O2: Nasal Cannula SpO2: 100 % O2 Flow Rate (L/min): 2 L/min  Physical Exam:  Constitutional well: Alert and oriented, no acute distress MSK: clean bandage around R foot with wound vac in place  Assessment/Plan:   Osteomyelitis of R fifth metatarsal  Patient remains afebrile with no significant leukocytosis. S/p angiography on 4/7, I&D and resection on 4/8.  Per podiatry, patient can have her wound VAC changed tomorrow and be discharged after with continued home health for dressing and wound VAC changes. - Holding antibiotics pending wound cultures. She will likely need two weeks of abx treatment - Oxycodone 5-10mg  q4h prn for severe pain, Tylenol q6h prn for mild pain - Senna-docusate 1 tablet at bedtime prn - PT recommending HH PT after discharge  Dysuria Vaginal itching Patient reports dysuria and vaginal itching. - UA - Vaginal swab for candida  T2DM Continue semglee 25 units daily and SSI. Blood sugars well controlled on current regimen  Chronic stable conditions HLD, PAD: Continue home ASA 81mg , rosuvastatin 20mg  COPD: Continue home Anoro Ellipta Depression: Continue home duloxetine 60mg  GERD: Continue home protonix 40mg  Chronic back pain: continue home pregabalin 200mg  TID HTN: continue home amlodipine 10mg ,  metoprolol 100mg , spironolactone 50mg  Urinary incontinence: continue home myrbetriq 50mg   Diet: NPO VTE: Heparin Code: Full  Dispo: Anticipated discharge to Home pending medical stability and PT evaluation  Monna Fam, MD PGY-1 Internal Medicine Resident Pager Number [phone] Please contact the on call pager after 5 pm and on weekends at [phone].

## 2024-03-30 NOTE — Plan of Care (Signed)
  Problem: Education: Goal: Knowledge of General Education information will improve Description: Including pain rating scale, medication(s)/side effects and non-pharmacologic comfort measures Outcome: Progressing   Problem: Nutrition: Goal: Adequate nutrition will be maintained Outcome: Progressing   Problem: Elimination: Goal: Will not experience complications related to bowel motility Outcome: Progressing   Problem: Safety: Goal: Ability to remain free from injury will improve Outcome: Progressing   Problem: Pain Managment: Goal: General experience of comfort will improve and/or be controlled Outcome: Progressing

## 2024-03-30 NOTE — Progress Notes (Signed)
  Subjective:  [name], female    DOB: June 19, 1961,  MRN: [id number]  Chief Complaint  Patient presents with   Wound Infection    DOS: 03/29/2024  Procedure: 1.  Irrigation debridement with fifth metatarsal resection, right foot 2.  Application dissolvable calcium sulfate antibiotic beads, right foot 3.  Wound VAC application, 6 x 4 cm, right foot  63 y.o. female seen for post op check. Pt eating breakfast. States she is having pain in right foot. Discussed plans for long term vac therapy right foot.  Review of Systems: Negative except as noted in the HPI. Denies N/V/F/Ch.   Objective:   Vitals:   03/29/24 2010 03/30/24 0845  BP: (!) 139/57 121/60  Pulse: 64 71  Resp: 16   Temp: 98.4 F (36.9 C)   SpO2:     Body mass index is 36.61 kg/m. Constitutional Well developed. Well nourished.  Vascular Foot warm and well perfused. Capillary refill normal to all digits.   No calf pain with palpation  Neurologic Normal speech. Oriented to person, place, and time. Epicritic sensation diminished to right foot  Dermatologic Woudn vac dressing functioning, no leaks. 100 mL sero sang drainage in canister  Orthopedic: R foot TMA, L BKA   Radiographs: Interval resection of the fifth metatarsal. Antibiotic beads in the operative bed.  Pathology: pending  Micro: FEW GRAM POSITIVE RODS  FEW GRAM NEGATIVE RODS  FEW GRAM POSITIVE COCCI   Assessment:   Osteomyelitis R foot s/p 5th met resection, abx beads, wound vac  Plan:  Patient was evaluated and treated and all questions answered.  POD # 1 s/p I&D with 5th met resection, abx beads, wound vac -Progressing as expected post op, wound vac funtioning - Plan for healing by secondary intent with vac - first vac change Friday. Ordered entered for home vac and home health. -XR: Expected post op changes -WB Status: WBAT in post op shoe to RLE -Sutures: None. -Medications/ABX: Continue abx pending further culture data,  likely ok to treat with 2 weeks PO option when cultures back - Will continue to follow        Corinna Gab, DPM [facility] / [facility]

## 2024-03-30 NOTE — Consult Note (Signed)
 WOC team reconsulted for R lower leg wounds; will DC Xeroform and start Medihoney; see prior consult note 03/25/2024   WOC Nurse wound follow up Wound type: full thickness likely r/t venous insufficiency; per patient started as blisters that ruptured  Measurement:see nursing flowsheet  Wound bed:60% pink dry 40% yellow dry  Drainage (amount, consistency, odor) see nursing flowsheet  Periwound:scattered dark hyperkeratotic skin  Dressing procedure/placement/frequency: Cleanse R lower leg wounds with Vashe cleanser [place] [phone]), do not rinse and allow to air dry. Apply Medihoney to wound beds daily, cover with dry gauze. Apply Eucerin to intact surrounding skin.  Cover with ABD pad and wrap with Kerlix roll gauze to secure.    POC discussed with bedside nurse. WOC team will not follow.  Re-consult if further needs arise.   Thank you,    Priscella Mann MSN, RN-BC, [employer] [phone]

## 2024-03-30 NOTE — TOC Progression Note (Signed)
 Transition of Care [facility]) - Progression Note    Patient Details  Name: Jennifer [name] MRN: [id number] Date of Birth: 08-31-1961  Transition of Care [facility]) CM/SW Contact  Graves-Bigelow, Lamar Laundry, RN Phone Number: 03/30/2024, 3:21 PM  Clinical Narrative: Insurance approved wound vac for home. Case Manager has delivered the wound vac to the room. Case Manager was able to get home health services secured via [facility]. Start of care approved for Monday via podiatry. Patient will have wound vac change out on Thursday; Case Manager has asked Staff RN to arrange times with the son for wound vac education. Case Manager continues to look for the rollator that the patient came into the hospital with. Case Manager has called the ED and Security for assistance.       Expected Discharge Plan: Home w Home Health Services Barriers to Discharge: No Barriers Identified  Expected Discharge Plan and Services   Discharge Planning Services: CM Consult Post Acute Care Choice: Home Health Living arrangements for the past 2 months: Single Family Home                 DME Arranged: Negative pressure wound device DME Agency: [employer] Date DME Agency Contacted: 03/29/24 Time DME Agency Contacted: 1308 Representative spoke with at DME Agency: French Ana HH Arranged: RN, Disease Management, PT, OT HH Agency: [facility] Date [facility] Agency Contacted: 03/30/24 Time HH Agency Contacted: 1330 Representative spoke with at [facility] Agency: Clifton Custard   Social Determinants of Health (SDOH) Interventions SDOH Screenings   Food Insecurity: No Food Insecurity (03/24/2024)  Housing: Low Risk  (03/24/2024)  Transportation Needs: Unmet Transportation Needs (03/24/2024)  Utilities: At Risk (03/24/2024)  Alcohol Screen: Low Risk  (03/31/2023)  Depression (PHQ2-9): High Risk (10/08/2023)  Financial Resource Strain: High Risk (03/31/2023)  Physical Activity: Inactive (03/31/2023)  Social Connections: Moderately Isolated (03/24/2024)   Stress: Stress Concern Present (03/31/2023)  Tobacco Use: High Risk (03/29/2024)   Readmission Risk Interventions    03/29/2024    1:06 PM 10/27/2021   10:18 AM  Readmission Risk Prevention Plan  Transportation Screening Complete Complete  PCP or Specialist Appt within 3-5 Days  Complete  HRI or Home Care Consult Complete Complete  Social Work Consult for Recovery Care Planning/Counseling Complete Complete  Palliative Care Screening Not Applicable Not Applicable  Medication Review Oceanographer) Referral to Pharmacy Complete

## 2024-03-30 NOTE — Evaluation (Signed)
 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 S/p debridement of R 5th metatarsal resection. WBAT in post op shoe. 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  Clinical Impression  PTA pt living with son, who is on disability and can not assist her physically, but does help with set up of ADLs and iADLs. Pt was using Rollator and L BKA prosthetic to ambulate in home and wheelchair for mobility in the community. Pt is currently limited in safe mobility by R post op shoe and wound vac, as well as skin integrity issues on L  residual limb, in place of generalized weakness. Pt is modI for bed mobility and min-modAx2 for transfer to recliner. Pt recommending use of wheelchair at home until L wound heals and she can use her prosthetic safely again. PT recommending HHPT at discharge. PT will continue to follow acutely.        If plan is discharge home, recommend the following: A little help with walking and/or transfers;A little help with bathing/dressing/bathroom;Assistance with cooking/housework;Assist for transportation;Help with stairs or ramp for entrance   Can travel by private vehicle        Equipment Recommendations Rollator (4 wheels) (hers has been lost at the hospital)  Recommendations for Other Services       Functional Status Assessment Patient has had a recent decline in their functional status and demonstrates the ability to make significant improvements in function in a reasonable and predictable amount of time.     Precautions / Restrictions Precautions Precautions: Fall Required Braces or Orthoses: Other Brace Other Brace: R  post op shoe, L AKA prosthetic for ambulation Restrictions Weight Bearing Restrictions Per Provider Order: Yes RLE Weight Bearing Per Provider Order: Weight bearing as tolerated      Mobility  Bed Mobility Overal bed mobility: Modified Independent             General bed mobility comments: use of bed rails to pull to EoB in flattened position    Transfers Overall transfer level: Needs assistance Equipment used: Rolling walker (2 wheels) Transfers: Sit to/from Stand, Bed to chair/wheelchair/BSC Sit to Stand: Min assist, +2 physical assistance, From elevated surface Stand pivot transfers: Min assist, Mod assist, +2 physical assistance         General transfer comment: minAx2 to power up from elevated bed surface, min-modAx2 for pivoting on R LE to sit in recliner, pt with decreased ability to use UE for offweighting R LE for pivot          Balance Overall balance assessment: Needs assistance Sitting-balance support: Feet supported, No upper extremity supported Sitting balance-Leahy Scale: Normal     Standing balance support: Bilateral upper extremity supported, During functional activity, Reliant on assistive device for balance Standing balance-Leahy Scale: Poor                               Pertinent Vitals/Pain Pain Assessment Pain Assessment: Faces Faces Pain Scale: No hurt    Home Living Family/patient expects to be discharged to:: Private residence Living Arrangements: Children;Other relatives Available Help at Discharge: Family;Available 24 hours/day Type of Home: House Home Access: Ramped entrance  Home Layout: One level Home Equipment: Rollator (4 wheels);Tub bench;Wheelchair - power (brought Rollator with her to hospital, is currently missing) Additional Comments: mostly uses Rollator in home due to lack of room for power wheelchair, use wheelchair for mobility outside the home    Prior Function Prior Level of Function : Needs  assist       Physical Assist : Mobility (physical)     Mobility Comments: uses Rollator and prosthetic to get around her home, electric wheelchair in community, father provides transportation ADLs Comments: son assists with meal prep, pt does her own bathing and dression     Extremity/Trunk Assessment   Upper Extremity Assessment Upper Extremity Assessment: Overall WFL for tasks assessed    Lower Extremity Assessment Lower Extremity Assessment: RLE deficits/detail;LLE deficits/detail RLE Deficits / Details: R post op shoe s/p surgery, WBAT, wound vac in place, knee and hip ROM and strength WFL, non-healing  wounds on pretibial surface RLE Sensation: decreased light touch LLE Deficits / Details: L BKA, hip and knee ROM WFL, L residual limb with skin breakdown, pt reports it has been worse, PT educated on protection of residual limb for prosthetic LLE Sensation: decreased light touch    Cervical / Trunk Assessment Cervical / Trunk Assessment: Kyphotic  Communication   Communication Communication: No apparent difficulties    Cognition Arousal: Alert Behavior During Therapy: WFL for tasks assessed/performed   PT - Cognitive impairments: Safety/Judgement                       PT - Cognition Comments: pt has skin breakdown on L residual limb, pt aware that she probably should not be using her prosthetic but reports breakdown has been worse in the past and "you've gotta do what you've gotta do" Following commands: Intact       Cueing Cueing Techniques: Verbal cues, Tactile cues, Visual cues     General Comments General comments (skin integrity, edema, etc.): minor L residual limb breakdown covered with foam bandage, R pretibial surface with multiple non-healing wounds. VSS on RA        Assessment/Plan    PT Assessment Patient needs continued PT services  PT Problem List Decreased strength;Decreased range of motion;Decreased activity tolerance;Decreased  balance;Decreased mobility;Impaired sensation;Decreased skin integrity;Pain       PT Treatment Interventions DME instruction;Gait training;Functional mobility training;Therapeutic activities;Therapeutic exercise;Balance training;Cognitive remediation;Patient/family education    PT Goals (Current goals can be found in the Care Plan section)  Acute Rehab PT Goals PT Goal Formulation: With patient Time For Goal Achievement: 04/13/24 Potential to Achieve Goals: Fair    Frequency Min 2X/week     Co-evaluation               AM-PAC PT "6 Clicks" Mobility  Outcome Measure Help needed turning from your back to your side while in a flat bed without using bedrails?: None Help needed moving from lying on your back to sitting on the side of a flat bed without using bedrails?: None Help needed moving to and from a bed to a chair (including a wheelchair)?: A Lot Help needed standing up from a chair using your arms (e.g., wheelchair or bedside chair)?: A Lot Help needed to walk in hospital room?: Total Help needed climbing 3-5 steps with a railing? : Total 6 Click Score: 14    End of Session Equipment Utilized During Treatment: Gait belt Activity Tolerance: Patient tolerated treatment well Patient left: in chair;with call bell/phone within reach;with chair alarm  set Nurse Communication: Mobility status PT Visit Diagnosis: Unsteadiness on feet (R26.81);Other abnormalities of gait and mobility (R26.89);Muscle weakness (generalized) (M62.81);Difficulty in walking, not elsewhere classified (R26.2);Pain Pain - Right/Left: Right Pain - part of body: Ankle and joints of foot    Time: 0911-0936 PT Time Calculation (min) (ACUTE ONLY): 25 min   Charges:   PT Evaluation $PT Eval Moderate Complexity: 1 Mod PT Treatments $Therapeutic Activity: 8-22 mins PT General Charges $$ ACUTE PT VISIT: 1 Visit         Brigitt Mcclish B. Beverely Risen PT, DPT Acute Rehabilitation Services Please use secure  chat or  Call Office [phone]   Elon Alas [facility] 03/30/2024, 11:47 AM

## 2024-03-30 NOTE — Progress Notes (Signed)
 Mobility Specialist Progress Note;   03/30/24 1145  Mobility  Activity Transferred to/from [facility]  Level of Assistance Moderate assist, patient does 50-74%  Assistive Device Front wheel walker  Distance Ambulated (ft) 2 ft  RLE Weight Bearing Per Provider Order WBAT  Activity Response Tolerated well  Mobility Referral Yes  Mobility visit 1 Mobility  Mobility Specialist Start Time (ACUTE ONLY) 1145  Mobility Specialist Stop Time (ACUTE ONLY) 1200  Mobility Specialist Time Calculation (min) (ACUTE ONLY) 15 min   Answered pts call bell requesting assistance to [facility]. Required ModA to stand from chair and pivot over to [facility]. BM successful, pt performed peri care. VSS throughout and no c/o when asked. Pt safely returned back to chair with all needs met. Family in room.   Caesar Bookman Mobility Specialist Please contact via SecureChat or [employer] [phone]

## 2024-03-31 ENCOUNTER — Other Ambulatory Visit (HOSPITAL_COMMUNITY): Payer: Self-pay

## 2024-03-31 DIAGNOSIS — M86171 Other acute osteomyelitis, right ankle and foot: Secondary | ICD-10-CM | POA: Diagnosis not present

## 2024-03-31 DIAGNOSIS — E1169 Type 2 diabetes mellitus with other specified complication: Secondary | ICD-10-CM | POA: Diagnosis not present

## 2024-03-31 LAB — URINALYSIS, ROUTINE W REFLEX MICROSCOPIC
Bacteria, UA: NONE SEEN
Bilirubin Urine: NEGATIVE
Glucose, UA: >=500 mg/dL — AB
Hgb urine dipstick: NEGATIVE
Ketones, ur: NEGATIVE mg/dL
Leukocytes,Ua: NEGATIVE
Nitrite: NEGATIVE
Protein, ur: NEGATIVE mg/dL
Specific Gravity, Urine: 1.005 (ref 1.005–1.030)
pH: 6 (ref 5.0–8.0)

## 2024-03-31 LAB — GLUCOSE, CAPILLARY
Glucose-Capillary: 268 mg/dL — ABNORMAL HIGH (ref 70–99)
Glucose-Capillary: 363 mg/dL — ABNORMAL HIGH (ref 70–99)
Glucose-Capillary: 348 mg/dL — ABNORMAL HIGH (ref 70–99)

## 2024-03-31 MED ORDER — OXYCODONE HCL 5 MG PO TABS
5.0000 mg | ORAL_TABLET | ORAL | 0 refills | Status: DC | PRN
  Filled 2024-03-31: qty 10, 2d supply, fill #0

## 2024-03-31 MED ORDER — ENOXAPARIN SODIUM 60 MG/0.6ML IJ SOSY
50.0000 mg | PREFILLED_SYRINGE | INTRAMUSCULAR | Status: DC
  Administered 2024-03-31: 50 mg via SUBCUTANEOUS
  Filled 2024-03-31: qty 0.6

## 2024-03-31 MED ORDER — AMOXICILLIN-POT CLAVULANATE 875-125 MG PO TABS
1.0000 | ORAL_TABLET | Freq: Two times a day (BID) | ORAL | 0 refills | Status: AC
  Filled 2024-03-31: qty 6, 3d supply, fill #0

## 2024-03-31 MED ORDER — FLUCONAZOLE 150 MG PO TABS
150.0000 mg | ORAL_TABLET | Freq: Every day | ORAL | 0 refills | Status: AC
  Filled 2024-03-31: qty 2, 3d supply, fill #0

## 2024-03-31 MED ORDER — ENOXAPARIN SODIUM 40 MG/0.4ML IJ SOSY: 40.0000 mg | PREFILLED_SYRINGE | INTRAMUSCULAR | Status: DC

## 2024-03-31 NOTE — Anesthesia Postprocedure Evaluation (Signed)
 Anesthesia Post Note  Patient: Jennifer [name]  Procedure(s) Performed: EXCISION, METATARSAL BONE, HEAD (Right: Toe)     Patient location during evaluation: PACU Anesthesia Type: MAC Level of consciousness: awake and alert Pain management: pain level controlled Vital Signs Assessment: post-procedure vital signs reviewed and stable Respiratory status: spontaneous breathing, nonlabored ventilation, respiratory function stable and patient connected to nasal cannula oxygen Cardiovascular status: stable and blood pressure returned to baseline Postop Assessment: no apparent nausea or vomiting Anesthetic complications: no   No notable events documented.  Last Vitals:  Vitals:   03/31/24 0815 03/31/24 0900  BP: 132/67 (!) 143/52  Pulse: 75 75  Resp: 14   Temp: 37.1 C   SpO2: 92%     Last Pain:  Vitals:   03/31/24 0906  TempSrc:   PainSc: 9                  Kennieth Rad

## 2024-03-31 NOTE — Consult Note (Signed)
 WOC Nurse Consult Note: Reason for Consult:Wound Vac change.  S/P Irrigation debridement with fifth metatarsal resection, right foot 2.  Application dissolvable calcium sulfate antibiotic beads, right foot  Wound type: surgical debridement Pressure Injury POA: NA Measurement: 6 cm x 4 cm x 4 cm  Wound ZHY:QMVHQ red nongranulating with antibiotic beads in wound bed.   Drainage (amount, consistency, odor) minimal serosanguinous noted in canister.  Periwound: History transmetatarsal amputation.    Left BKA Dressing procedure/placement/frequency: Old dressing removed and wound cleansed gently to preserve beads. Barrier ring around wound edges to promote seal.  One piece black foam to wound bed and one across dorsal aspect of foot over protected skin to create a bridge and offload pressure to wound bed.  Patient wears DARCO shoe and educated to keep velcro loose to avoid pressure to dorsal foot.   Family member COrey was present at beginning of my visit and left during much of the teaching.  I reviewed with patient and COrey, upon his return, about removing dressing if VAC is alarming and steps to apply a NS moist gauze.  Discussed signs of infection such as increased drainage, redness, odor and raise in blood sugar.  Patient is a smoker and I educated on the impact of smoking on wound healing.  As well as diabetes.  She is aware these place her at risk for further amputation and limb loss.  She states she is going to cut down and try to quit. Patient is connected to HOME VAC unit.   Will not follow at this time.  Please re-consult if needed.  Mike Gip MSN, RN, FNP-BC CWON Wound, Ostomy, Continence Nurse Outpatient [facility] [phone] Pager [phone]

## 2024-03-31 NOTE — TOC Transition Note (Signed)
 Transition of Care [facility]) - Discharge Note   Patient Details  Name: Jennifer [name] MRN: [id number] Date of Birth: 1961/03/26  Transition of Care [facility]) CM/SW Contact:  Gala Lewandowsky, RN Phone Number: 03/31/2024, 12:35 PM   Clinical Narrative: Case Manager spoke with patient regarding her DME rollator. Safety Zone will be completed from the Unit Manager. Case Manager has called the ED, Security, MRI and Victorino Dike with lost and found in the ED to see if anyone has seen the patients rollator. Case Manager has ordered a new rollator for the patient via [employer] and co pay of $20.00 has been paid via TOC. From the Safety Zone, the patient should be contacted regarding lost DME. [facility] has been notified that the patient will transition home today. No further needs identified at this time.    Final next level of care: Home w Home Health Services Barriers to Discharge: No Barriers Identified   Patient Goals and CMS Choice Patient states their goals for this hospitalization and ongoing recovery are:: Plan to return home with son   Choice offered to / list presented to : Patient (Patient chose [facility]unable to assist and then chose [facility])   Discharge Plan and Services Additional resources added to the After Visit Summary for     Discharge Planning Services: CM Consult Post Acute Care Choice: Home Health          DME Arranged: Walker rolling with seat DME Agency: [employer] Date DME Agency Contacted: 03/31/24 Time DME Agency Contacted: 1235 Representative spoke with at DME Agency: Vaughan Basta HH Arranged: RN, Disease Management, PT, OT HH Agency: [facility] Date [facility] Agency Contacted: 03/30/24 Time HH Agency Contacted: 1330 Representative spoke with at [facility] Agency: Clifton Custard  Social Drivers of Health (SDOH) Interventions SDOH Screenings   Food Insecurity: No Food Insecurity (03/24/2024)  Housing: Low Risk  (03/24/2024)  Transportation Needs:  Unmet Transportation Needs (03/24/2024)  Utilities: At Risk (03/24/2024)  Alcohol Screen: Low Risk  (03/31/2023)  Depression (PHQ2-9): High Risk (10/08/2023)  Financial Resource Strain: High Risk (03/31/2023)  Physical Activity: Inactive (03/31/2023)  Social Connections: Moderately Isolated (03/24/2024)  Stress: Stress Concern Present (03/31/2023)  Tobacco Use: High Risk (03/29/2024)   Readmission Risk Interventions    03/29/2024    1:06 PM 10/27/2021   10:18 AM  Readmission Risk Prevention Plan  Transportation Screening Complete Complete  PCP or Specialist Appt within 3-5 Days  Complete  HRI or Home Care Consult Complete Complete  Social Work Consult for Recovery Care Planning/Counseling Complete Complete  Palliative Care Screening Not Applicable Not Applicable  Medication Review Oceanographer) Referral to Pharmacy Complete

## 2024-03-31 NOTE — Evaluation (Signed)
 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 S/p debridement of R 5th metatarsal resection. WBAT in post op shoe. 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     Clinical Impressions PTA, pt lives with family, typically ambulatory using Rollator and L prosthetic LE at home and reports Modified Independence with ADLs. Pt presents now fairly close to reported baseline with CGA to Min A needed for safe transfers and CGA for standing LB ADLs d/t impulsivity and impaired safety awareness. Encouraged use of w/c in the home as able to allow pressure wound on L residual limb to heal and prevent complications though pt denied concerns w/ managing at home. Pt left on [facility] w/ wound care RN and nursing students present for wound vac change. Anticipate no OT needs at DC as pt denies concerns and eager to DC home today.     If plan is discharge home, recommend the following:   A little help with walking and/or transfers;A little help with bathing/dressing/bathroom;Assistance with cooking/housework     Functional Status Assessment   Patient has had a recent decline in their functional status and demonstrates the ability to make significant improvements in function in a reasonable and predictable amount of time.     Equipment Recommendations   None recommended by OT     Recommendations for Other Services         Precautions/Restrictions   Precautions Precautions: Fall Precaution/Restrictions Comments: R foot wound vac Required Braces or Orthoses: Other Brace Other Brace: R post op shoe, L AKA prosthetic for  ambulation Restrictions Weight Bearing Restrictions Per Provider Order: Yes RLE Weight Bearing Per Provider Order: Weight bearing as tolerated     Mobility Bed Mobility               General bed mobility comments: received on BSC    Transfers Overall transfer level: Needs assistance Equipment used: None Transfers: Sit to/from Stand Sit to Stand: Contact guard assist, Min assist           General transfer comment: CGA to MinA  for various standing from [facility] for hygiene. Planned transfer back to bed for wound vac change though pt became agitated and declined reporting wound care RN can change it while she sits on the [facility]. Unable to redirect. Left on BSC- NT aware and nursing students present as well      Balance Overall balance assessment: Needs assistance Sitting-balance support: Feet supported, No upper extremity supported Sitting balance-Leahy Scale: Normal     Standing balance support: Bilateral upper extremity supported, Single extremity supported, During functional activity Standing balance-Leahy Scale: Poor                             ADL either performed or assessed with clinical judgement   ADL Overall ADL's : Needs assistance/impaired Eating/Feeding: Independent   Grooming: Set up;Sitting   Upper Body Bathing: Set up;Sitting Upper Body Bathing Details (indicate cue type and reason): donning bra and tanktop Lower Body Bathing: Contact guard assist;Sit to/from stand;Sitting/lateral leans   Upper Body Dressing : Set up;Sitting   Lower Body Dressing: Minimal assistance;Sitting/lateral leans;Sit to/from stand Lower Body  Dressing Details (indicate cue type and reason): Min A to don pull up over post op shoe and disconnected wound vac cord (wound care RN present) Toilet Transfer: Minimal assistance;BSC/3in1 Toilet Transfer Details (indicate cue type and reason): received on BSC, able to stand from [facility] pushing from armrests for hygiene Toileting-  Clothing Manipulation and Hygiene: Contact guard assist;Sit to/from stand;Sitting/lateral lean Toileting - Clothing Manipulation Details (indicate cue type and reason): for safety as pt leaning precariously onto bed, holding bedrail while performing hygiene with significant truncal flexion. OT blocking LLE to prevent sliding, noted prosthetic not secure             Vision Ability to See in Adequate Light: 1 Impaired Patient Visual Report: No change from baseline Vision Assessment?: No apparent visual deficits     Perception         Praxis         Pertinent Vitals/Pain Pain Assessment Pain Assessment: No/denies pain     Extremity/Trunk Assessment Upper Extremity Assessment Upper Extremity Assessment: Overall WFL for tasks assessed;Right hand dominant   Lower Extremity Assessment Lower Extremity Assessment: Defer to PT evaluation   Cervical / Trunk Assessment Cervical / Trunk Assessment: Kyphotic   Communication Communication Communication: No apparent difficulties   Cognition Arousal: Alert Behavior During Therapy: Impulsive Cognition: No family/caregiver present to determine baseline             OT - Cognition Comments: likely at baseline, poor safety awareness, prefers to direct session                 Following commands: Intact       Cueing  General Comments   Cueing Techniques: Verbal cues;Tactile cues;Visual cues  Son initially present but left with volunteer using wheelchair to go to cafeteria though was meant to be present to observe wound vac change.   Exercises     Shoulder Instructions      Home Living Family/patient expects to be discharged to:: Private residence Living Arrangements: Children;Other relatives Available Help at Discharge: Family;Available 24 hours/day Type of Home: House Home Access: Ramped entrance     Home Layout: One level     Bathroom Shower/Tub: Chief Strategy Officer: Standard Bathroom  Accessibility: Yes   Home Equipment: Rollator (4 wheels);Tub bench;Wheelchair - power   Additional Comments: mostly uses Rollator in home due to lack of room for power wheelchair, use wheelchair for mobility outside the home      Prior Functioning/Environment Prior Level of Function : Needs assist       Physical Assist : Mobility (physical)     Mobility Comments: uses Rollator and prosthetic to get around her home, electric wheelchair in community, father provides transportation ADLs Comments: son assists with meal prep, pt does her own bathing, dressing and toileting    OT Problem List: Impaired balance (sitting and/or standing);Decreased safety awareness;Decreased cognition;Decreased knowledge of use of DME or AE   OT Treatment/Interventions: Self-care/ADL training;Therapeutic exercise;Energy conservation;DME and/or AE instruction;Therapeutic activities;Patient/family education;Balance training      OT Goals(Current goals can be found in the care plan section)   Acute Rehab OT Goals Patient Stated Goal: go home today OT Goal Formulation: With patient Time For Goal Achievement: 04/14/24 Potential to Achieve Goals: Good   OT Frequency:  Min 1X/week    Co-evaluation              AM-PAC OT "6 Clicks" Daily Activity     Outcome Measure Help from another person  eating meals?: None Help from another person taking care of personal grooming?: A Little Help from another person toileting, which includes using toliet, bedpan, or urinal?: A Little Help from another person bathing (including washing, rinsing, drying)?: A Little Help from another person to put on and taking off regular upper body clothing?: A Little Help from another person to put on and taking off regular lower body clothing?: A Little 6 Click Score: 19   End of Session Nurse Communication: Mobility status (discussed with NT)  Activity Tolerance: Patient tolerated treatment well Patient left: Other  (comment);with nursing/sitter in room (on [facility] with wound care RN and 2 nursing students present)  OT Visit Diagnosis: Other abnormalities of gait and mobility (R26.89);Muscle weakness (generalized) (M62.81)                Time: 2130-8657 OT Time Calculation (min): 18 min Charges:  OT General Charges $OT Visit: 1 Visit OT Evaluation $OT Eval Low Complexity: 1 Low  Bradd Canary, OTR/L Acute Rehab Services Office: [phone]   Lorre Munroe 03/31/2024, 12:55 PM

## 2024-03-31 NOTE — Inpatient Diabetes Management (Addendum)
 Inpatient Diabetes Program Recommendations  AACE/ADA: New Consensus Statement on Inpatient Glycemic Control (2015)  Target Ranges:  Prepandial:   less than 140 mg/dL      Peak postprandial:   less than 180 mg/dL (1-2 hours)      Critically ill patients:  140 - 180 mg/dL   Lab Results  Component Value Date   GLUCAP 348 (H) 03/31/2024   HGBA1C 9.2 (A) 02/18/2024    Latest Reference Range & Units 03/30/24 17:11 03/30/24 21:15 03/31/24 07:50 03/31/24 09:27 03/31/24 11:38  Glucose-Capillary 70 - 99 mg/dL 161 (H) 096 (H) 045 (H) 363 (H) 348 (H)  (H): Data is abnormally high Review of Glycemic Control  Diabetes history: type 2 Outpatient Diabetes medications: Tresiba 65 units daily, Metformin 500 mg BID, Ozempic 1 mg weekly Current orders for Inpatient glycemic control: Semglee 30 units daily, Novolog 0-20 units correction scale TID, Novolog 0-5 units HS scale  Inpatient Diabetes Program Recommendations:   Noted that blood sugars have been greater than 200 mg/dl.  Recommend increasing Semglee to 35 units daily and adding Novolog 5 units TID with meals if eating at least 50% of meal. Recommend changing diet to CHO modified diet.   Smith Mince RN BSN CDE Diabetes Coordinator Pager: [phone]  8am-5pm

## 2024-03-31 NOTE — Progress Notes (Signed)
  Subjective:  [name], female    DOB: 02-24-1961,  MRN: [id number]  Chief Complaint  Patient presents with   Wound Infection    DOS: 03/29/2024  Procedure: 1.  Irrigation debridement with fifth metatarsal resection, right foot 2.  Application dissolvable calcium sulfate antibiotic beads, right foot 3.  Wound VAC application, 6 x 4 cm, right foot  63 y.o. female seen for post op check.   States she wants to home today.   Review of Systems: Negative except as noted in the HPI. Denies N/V/F/Ch.   Objective:   Vitals:   03/30/24 1942 03/30/24 1945  BP: 130/76 130/76  Pulse:  67  Resp:  19  Temp:  98 F (36.7 C)  SpO2:     Body mass index is 36.61 kg/m. Constitutional Well developed. Well nourished.  Vascular Foot warm and well perfused. Capillary refill normal to all digits.   No calf pain with palpation  Neurologic Normal speech. Oriented to person, place, and time. Epicritic sensation diminished to right foot  Dermatologic Woudn vac dressing functioning, no leaks. 200 mL sero sang drainage in canister  Orthopedic: R foot TMA, L BKA   Radiographs: Interval resection of the fifth metatarsal. Antibiotic beads in the operative bed.  Pathology: pending  Micro: FEW GRAM POSITIVE RODS  FEW GRAM NEGATIVE RODS  FEW GRAM POSITIVE COCCI   Assessment:   Osteomyelitis R foot s/p 5th met resection, abx beads, wound vac  Plan:  Patient was evaluated and treated and all questions answered.  POD # 2 s/p I&D with 5th met resection, abx beads, wound vac -Progressing as expected post op, wound vac funtioning - Plan for healing by secondary intent with vac - first vac change Today per WOCN, appreciate recs. Next vac change per [facility] RN on Monday  -XR: Expected post op changes -WB Status: WBAT in post op shoe to RLE -Sutures: None. -Medications/ABX: Continue abx pending further culture data, likely ok to treat with 2 weeks PO option when cultures back - Pt  stable for DC later today if home care and vac set up for home. Follow up in office 2 weeks. Will Sign off at this time.         Corinna Gab, DPM [facility]

## 2024-03-31 NOTE — Care Management Important Message (Signed)
 Important Message  Patient Details  Name: Jennifer [name] MRN: [id number] Date of Birth: September 28, 1961   Important Message Given:  Yes - Medicare IM     Renie Ora 03/31/2024, 12:00 PM

## 2024-03-31 NOTE — Progress Notes (Signed)
 DISCHARGE NOTE HOME Jennifer [name] to be discharged Home per MD order. Discussed prescriptions and follow up appointments with the patient. Prescriptions given to patient; medication list explained in detail. Patient verbalized understanding.  Skin clean, dry and intact without evidence of skin break down, no evidence of skin tears noted. IV catheter discontinued intact. Site without signs and symptoms of complications. Dressing and pressure applied. Pt denies pain at the site currently. No complaints noted.  Patient free of lines, drains, and wounds.   An After Visit Summary (AVS) was printed and given to the patient. Patient escorted via wheelchair, and discharged home via private auto.  Lorine Bears, RN

## 2024-03-31 NOTE — Discharge Summary (Addendum)
 Name: Jennifer [name] MRN: [id number] DOB: 1961-06-27 63 y.o. PCP: Sherol Dixie, MD  Date of Admission: 03/24/2024  2:27 PM Date of Discharge: 03/31/24 Attending Physician: Dr. Broadus Canes  Discharge Diagnosis: Principal Problem:   Right foot infection Active Problems:   Diabetes mellitus (HCC)   Vaginal candidiasis   Hypertension   Peripheral arterial disease with history of revascularization (HCC)   Opioid dependence, uncomplicated (HCC)   Urinary incontinence, mixed, urge/stress/functional   Diabetic polyneuropathy associated with type 2 diabetes mellitus (HCC)   Edema of right lower extremity due to peripheral venous insufficiency   Status post transmetatarsal amputation of foot, right (HCC)   Subacute osteomyelitis of right foot [facility])    Discharge Medications: Allergies as of 03/31/2024       Reactions   Benazepril Swelling   Possible tongue swelling 06/14/2021   Chantix [varenicline] Other (See Comments)   Unknown reaction   Ioversol    Morphine Sulfate    Abilify [aripiprazole] Other (See Comments)   Urinary freq Nov 2016   Iohexol Other (See Comments)   IV CONTRAST CAUSED TRANSIENT NEPHROPATHY (KIDNEY INSUFFICIENCY) IN 2007   Ivp Dye [iodinated Contrast Media] Other (See Comments)   IV CONTRAST CAUSED TRANSIENT NEPHROPATHY (KIDNEY INSUFFICIENCY) IN 2007        Medication List     STOP taking these medications    doxycycline 100 MG capsule Commonly known as: VIBRAMYCIN   nystatin cream Commonly known as: MYCOSTATIN   nystatin powder Commonly known as: nystatin   oxybutynin 10 MG 24 hr tablet Commonly known as: DITROPAN-XL       TAKE these medications    Accu-Chek Guide Test test strip Generic drug: glucose blood Use as back up to CGM sensors up to 1 time a day   Accu-Chek Guide w/Device Kit Use to check blood sugar up to 1 times a day   Accu-Chek Softclix Lancets lancets Use as back up to CGM sensors up to 1 time a day    acetaminophen 650 MG CR tablet Commonly known as: TYLENOL Take 2 tablets (1,300 mg total) by mouth every 8 (eight) hours as needed for pain. What changed:  how much to take when to take this   albuterol 108 (90 Base) MCG/ACT inhaler Commonly known as: ProAir HFA INHALE 2 PUFFS BY MOUTH EVERY 6 HOURS AS NEEDED FOR WHEEZING   amLODipine 10 MG tablet Commonly known as: NORVASC Take 1 tablet (10 mg total) by mouth daily.   amoxicillin-clavulanate 875-125 MG tablet Commonly known as: AUGMENTIN Take 1 tablet by mouth 2 (two) times daily for 3 days.   Anoro Ellipta 62.5-25 MCG/ACT Aepb Generic drug: umeclidinium-vilanterol Inhale 1 puff into the lungs in the morning.   aspirin EC 81 MG tablet Take 1 tablet (81 mg total) by mouth daily.   Baclofen 5 MG Tabs Take 1 tablet (5 mg total) by mouth 2 (two) times daily as needed (muscle spasms).   Dexcom G7 Sensor Misc Place new sensor every 10 days. Use to monitor blood sugar continuously.   DULoxetine 60 MG capsule Commonly known as: CYMBALTA Take 1 capsule (60 mg total) by mouth daily.   fluconazole 150 MG tablet Commonly known as: DIFLUCAN Take 1 tablet (150 mg total) by mouth daily. Take each dose 72 hours (3 days) apart.   lidocaine 5 % Commonly known as: Lidoderm Place 1 patch onto the skin daily. Remove & Discard patch within 12 hours or as directed by MD   metFORMIN 500  MG 24 hr tablet Commonly known as: GLUCOPHAGE-XR Take 1 tablet (500 mg total) by mouth daily with breakfast.   methocarbamol 500 MG tablet Commonly known as: ROBAXIN Take 1 tablet (500 mg total) by mouth 2 (two) times daily. What changed:  when to take this reasons to take this   metoCLOPramide 5 MG tablet Commonly known as: Reglan Take 1 tablet (5 mg total) by mouth 3 (three) times daily before meals.   metoprolol succinate 100 MG 24 hr tablet Commonly known as: TOPROL-XL Take 1 tablet (100 mg total) by mouth daily. TAKE WITH OR IMMEDIATELY  FOLLOWING A MEAL.   Myrbetriq 50 MG Tb24 tablet Generic drug: mirabegron ER Take 1 tablet (50 mg total) by mouth daily.   ondansetron 4 MG disintegrating tablet Commonly known as: ZOFRAN-ODT Take 1 tablet (4 mg total) by mouth every 8 (eight) hours as needed for nausea or vomiting. What changed: when to take this   oxyCODONE 5 MG immediate release tablet Commonly known as: Oxy IR/ROXICODONE Take 1 tablet (5 mg total) by mouth every 4 (four) hours as needed for up to 5 days. What changed: reasons to take this   Ozempic (1 MG/DOSE) 4 MG/3ML Sopn Generic drug: Semaglutide (1 MG/DOSE) Inject 1 mg into the skin once a week.   pantoprazole 40 MG tablet Commonly known as: PROTONIX Take 1 tablet (40 mg total) by mouth daily.   pregabalin 200 MG capsule Commonly known as: LYRICA Take 1 capsule (200 mg total) by mouth 3 (three) times daily.   rosuvastatin 20 MG tablet Commonly known as: CRESTOR Take 1 tablet (20 mg total) by mouth in the morning.   spironolactone 50 MG tablet Commonly known as: ALDACTONE Take 1 tablet (50 mg total) by mouth daily. What changed: when to take this   SSD 1 % cream Generic drug: silver sulfADIAZINE Apply 1 Application topically daily. To the wounds on your leg and foot   TechLite Plus Pen Needles 32G X 4 MM Misc Generic drug: Insulin Pen Needle Use to inject insulin 4 (four) times daily.   traZODone 100 MG tablet Commonly known as: DESYREL Take 200 mg by mouth at bedtime as needed for sleep.   Tresiba FlexTouch 200 UNIT/ML FlexTouch Pen Generic drug: insulin degludec Inject 65 Units into the skin daily.               Discharge Care Instructions  (From admission, onward)           Start     Ordered   03/31/24 0000  Discharge wound care:       Comments: Cleanse R lower leg wounds with Vashe cleanser ), do not rinse and allow to air dry. Cover with dry gauze. Apply Eucerin to intact surrounding skin.  Cover with ABD pad and wrap  with Kerlix roll gauze to secure.   03/31/24 1358            Disposition and follow-up:   Ms.Nonie L Messman was discharged from [facility] in Stable condition.  At the hospital follow up visit please address:  1.  Follow-up:  Osteomyelitis of R fifth metatarsal s/p resection: check if patient completed 3 day course of antibiotics. Check wound for appropriate healing or signs of recurrent infection.    Vaginal itching: patient was treated empirically for vaginal candidiasis with Diflucan 150mg  x 2 doses. Check for resolution of symptoms.   2.  Labs / imaging needed at time of follow-up: None  3.  Pending labs/ test needing follow-up: None  4.  Medication Changes  Started: Diflucan 150mg  x 2 doses  Stopped:  Changed:  Abx - Augmentin for 3 days  End Date: 04/02/24  Follow-up Appointments:  Follow-up Information     [facility] Follow up in 1 month(s).   Why: The office will call you with your appointment Contact information: [address] [place]  [address] [phone]        [facility] Follow up.   Specialty: Home Health Services Why: Registered Nurse, Physical and Occupational Therapy-office to call with visit times Contact information: [address] [place] [address] [phone]         [employer] [employer] [employer] Follow up.   Why: Rolling walker with seat to be delivered to the room before she leaves. Contact information: Address: [address], [place], [place] [address] Phone: [phone]                Hospital Course by problem list:  Osteomyelitis of the R fifth metatarsal shaft Patient presented to the ED with right foot swelling and pain. She had a R TMA performed in February 2024. She was seen in the ED earlier in April for same symptoms and prescribed doxycycline, which failed to improve her symptoms. MRI of the foot should soft tissue wound of the lateral  plantar midfoot with osteomyelitis of the remnant metatarsal shaft. Angiography was performed on 4/7 with maximal revascularization by vascular surgery. On 4/8 podiatry performed incision and drainage with resection of diseased bone. Wound culture grew group A strep. Patient continued to improve and was discharged home to complete 3 days of Augmentin with podiatry and PCP follow up scheduled.   Vaginal itching Patient endorsed vaginal itching during admission. UA was negative. Due to glucosuria seen on UA and patient's history of recurrent candida infections, patient was treated empirically with Diflucan x2 doses to be completed after discharge.   T2DM Blood sugar was managed with semglee 25 units daily and SSI during admission. Home dose of 65 units of long acting insulin was continued at discharge as patient resumes her regular diet.   HLD, PAD: Home ASA, atorvastatin were continued  COPD: Home Anoro Ellipta was continued  Depression: Home duloxetine was continued  GERD: Home protonix was continued  Chronic back pain: Home pregabalin was continued  HTN: Home amlodipine, metoprolol, spironolactone were continued  Urinary incontinence: Home myrbetriq was continued    Discharge Subjective: Patient feeling well, pain is well controlled. She is eating and drinking normally. She would like to be discharged home as soon as a new rollator can be located.   Discharge Exam:   BP (!) 143/52   Pulse 75   Temp 98.7 F (37.1 C) (Oral)   Resp 14   Ht 5\' 5"  (1.651 m)   Wt 99.8 kg   SpO2 92%   BMI 36.61 kg/m  Constitutional well: Alert and oriented, no acute distress MSK: clean bandage around R foot with wound vac in place Skin: superficial ulceration of R shin with small drainage  Pertinent Labs, Studies, and Procedures:     Latest Ref Rng & Units 03/30/2024    4:01 AM 03/29/2024    4:56 AM 03/28/2024    6:07 AM  CBC  WBC 4.0 - 10.5 K/uL 12.0  9.4  10.3   Hemoglobin 12.0 - 15.0 g/dL 46.2   70.3  50.0   Hematocrit 36.0 - 46.0 % 34.2  38.3  37.6   Platelets 150 - 400 K/uL 231  224  227        Latest Ref Rng & Units 03/29/2024    4:56 AM 03/28/2024    6:07 AM 03/25/2024    5:47 AM  CMP  Glucose 70 - 99 mg/dL 409  811  914   BUN 8 - 23 mg/dL 8  8  6    Creatinine 0.44 - 1.00 mg/dL 7.82  9.56  2.13   Sodium 135 - 145 mmol/L 135  136  139   Potassium 3.5 - 5.1 mmol/L 4.4  4.3  3.6   Chloride 98 - 111 mmol/L 97  100  103   CO2 22 - 32 mmol/L 27  28  28    Calcium 8.9 - 10.3 mg/dL 8.7  8.6  8.7   Total Protein 6.5 - 8.1 g/dL  6.0    Total Bilirubin 0.0 - 1.2 mg/dL  <0.8    Alkaline Phos 38 - 126 U/L  60    AST 15 - 41 U/L  12    ALT 0 - 44 U/L  10      MR FOOT RIGHT W WO CONTRAST Result Date: 03/24/2024 CLINICAL DATA:  History of transmetatarsal amputation with right lateral foot wound. EXAM: MRI OF THE RIGHT FOREFOOT WITHOUT AND WITH CONTRAST TECHNIQUE: Multiplanar, multisequence MR imaging of the right foot was performed before and after the administration of intravenous contrast. CONTRAST:  10mL GADAVIST GADOBUTROL 1 MMOL/ML IV SOLN COMPARISON:  Right foot radiographs dated 03/24/2024. MRI right foot dated 02/10/2023. FINDINGS: Bones/Joint/Cartilage Status post transmetatarsal amputation. Soft tissue wound at the lateral and plantar midfoot, overlying the remnant fifth metatarsal. There is marrow signal abnormality with associated enhancement of the fifth metatarsal shaft, most pronounced distally, compatible with osteomyelitis. No marrow signal abnormality identified elsewhere to suggest osteomyelitis. TMT joints are anatomically aligned with mild degenerative changes. No joint effusion. Ligaments Lisfranc ligament is intact. Muscles and Tendons Postsurgical changes. Flexor, peroneal and extensor compartment tendons are intact. Diffuse atrophy of the intrinsic musculature of the foot, likely secondary to chronic denervation changes. Soft tissue Soft tissue wound at the lateral and  plantar midfoot with surrounding soft tissue edema and enhancement, concerning for cellulitis. No loculated fluid collection. Redemonstrated well-circumscribed lobulated T1 hyperintense mass demonstrating saturation on fluid sensitive sequences, with a few thin internal septations, within the plantar soft tissues of the midfoot, insinuating from the level of the first intermetatarsal space (series 4, image 16). This measures 4.0 x 3.3 x 2.9 cm and is favored to represent a soft tissue lipoma. No discrete nodular enhancing components. This is not significantly changed since the prior exam. IMPRESSION: 1. Soft tissue wound at the lateral plantar midfoot with osteomyelitis of the remnant fifth metatarsal shaft. 2. Status post transmetatarsal amputation. 3. Additional unchanged chronic findings, as above. Electronically Signed   By: Mannie Seek M.D.   On: 03/24/2024 21:44   DG Foot Complete Right Result Date: 03/24/2024 CLINICAL DATA:  Pain.  History of transmetatarsal amputation. EXAM: RIGHT FOOT COMPLETE - 3+ VIEW COMPARISON:  03/22/2024. FINDINGS: Status post transmetatarsal amputation. Transmetatarsal transsection margins demonstrates similar cortical indistinctness/irregularity, predominantly involving the second through fifth metatarsals. No definite new osteolysis or erosive changes. No acute fracture or dislocation. Calcaneal enthesopathy. Diffuse soft tissue swelling, most pronounced at the transsection stump. IMPRESSION: 1. Status post transmetatarsal amputation. Transmetatarsal transsection margins demonstrates similar cortical indistinctness/irregularity. No definite new osteolysis or erosive changes. No acute fracture or dislocation. 2.  Diffuse soft tissue swelling, most pronounced at the transsection stump. Electronically Signed   By: Mannie Seek M.D.   On: 03/24/2024 16:06    Signed: Jayson Michael, MD PGY-1 04/02/2024, 8:44 PM   Pager: [phone]

## 2024-03-31 NOTE — Progress Notes (Signed)
                  Subjective:   Summary: 63 yo F with PMH of insulin dependent T2DM, COPD, CKD 2, L BKA in 2014, R TMA in 2024, admitted for osteomyelitis of R foot  Patient feeling well today, no new complaints. She would still like to go home rather than to a SNF.  Informed her podiatry feels comfortable with her discharge once she gets a rollator  Objective:  Vital signs in last 24 hours: Vitals:   03/30/24 1942 03/30/24 1945 03/31/24 0815 03/31/24 0900  BP: 130/76 130/76 132/67 (!) 143/52  Pulse:  67 75 75  Resp:  19 14   Temp:  98 F (36.7 C) 98.7 F (37.1 C)   TempSrc:  Oral Oral   SpO2:   92%   Weight:      Height:       Supplemental O2: Nasal Cannula SpO2: 92 % O2 Flow Rate (L/min): 2 L/min  Physical Exam:  Constitutional well: Alert and oriented, no acute distress MSK: clean bandage around R foot with wound vac in place Skin: superficial ulceration of R shin with small drainage  Assessment/Plan:   Osteomyelitis of R fifth metatarsal  Patient remains afebrile with no significant leukocytosis. S/p angiography on 4/7, I&D and resection on 4/8.  Per podiatry, patient can have her wound VAC changed and be discharged with further vac changes by home health nurse. She will need podiatry follow up in 2 weeks. Since the diseased area of her foot has been removed and source control established, I do not feel she will need antibiotic therapy.  - Discharge home when a new rollator can be delivered - No antibiotics required - Follow up with podiatry in 2 weeks - Oxycodone 5-10mg  q4h prn for severe pain, Tylenol q6h prn for mild pain - Senna-docusate 1 tablet at bedtime prn - Weight bearing as tolerated - PT recommending HH PT after discharge  Dysuria Vaginal itching Patient reports dysuria and vaginal itching. UA negative, vaginal swab pending. Will treat empirically for candida infection based on recurrent candidal infections and glycosuria seen on UA - Fluconazole 150mg   today and 72 hours after  T2DM Continue semglee 25 units daily and SSI. Blood sugars well controlled on current regimen  Chronic stable conditions HLD, PAD: Continue home ASA 81mg , rosuvastatin 20mg  COPD: Continue home Anoro Ellipta Depression: Continue home duloxetine 60mg  GERD: Continue home protonix 40mg  Chronic back pain: continue home pregabalin 200mg  TID HTN: continue home amlodipine 10mg , metoprolol 100mg , spironolactone 50mg  Urinary incontinence: continue home myrbetriq 50mg   Diet: Regular VTE: Lovenox Code: Full  Dispo: Anticipated discharge to Home pending medical stability and PT evaluation  Monna Fam, MD PGY-1 Internal Medicine Resident Pager Number [phone] Please contact the on call pager after 5 pm and on weekends at [phone].

## 2024-04-01 ENCOUNTER — Telehealth: Payer: Self-pay | Admitting: *Deleted

## 2024-04-01 ENCOUNTER — Telehealth: Payer: Self-pay

## 2024-04-01 NOTE — Progress Notes (Signed)
 Complex Care Management Note Care Guide Note  04/01/2024 Name: Jennifer [name] MRN: [id number] DOB: [birth year]   Complex Care Management Outreach Attempts: An unsuccessful telephone outreach was attempted today to offer the patient information about available complex care management services.  Follow Up Plan:  Additional outreach attempts will be made to offer the patient complex care management information and services.   Encounter Outcome:  No Answer  Gwenevere Ghazi  [facility]  [facility], [facility] Guide  Direct Dial: [phone]  Fax [phone]

## 2024-04-01 NOTE — Transitions of Care (Post Inpatient/ED Visit) (Signed)
   04/01/2024  Name: Jennifer [name] MRN: [id number] DOB: 14-Apr-1961  Today's TOC FU Call Status: Today's TOC FU Call Status:: Unsuccessful Call (1st Attempt) Unsuccessful Call (1st Attempt) Date: 04/08/24  Attempted to reach the patient regarding the most recent Inpatient/ED visit.  Follow Up Plan: Additional outreach attempts will be made to reach the patient to complete the Transitions of Care (Post Inpatient/ED visit) call.   Hilbert Odor RN, CCM [facility]  VBCI-Population Health RN Care Manager [phone]

## 2024-04-04 ENCOUNTER — Telehealth: Payer: Self-pay

## 2024-04-04 NOTE — Transitions of Care (Post Inpatient/ED Visit) (Signed)
   04/04/2024  Name: Jennifer [name] MRN: [id number] DOB: Feb 17, 1961  Today's TOC FU Call Status: Today's TOC FU Call Status:: Unsuccessful Call (2nd Attempt) Unsuccessful Call (1st Attempt) Date: 04/01/24 Unsuccessful Call (2nd Attempt) Date: 04/04/24  Attempted to reach the patient regarding the most recent Inpatient/ED visit. Patient was called in an Outreach attempt to offer VBCI  30-day TOC program. Pt is eligible for program due to potential risk for readmission and/or high utilization. Unfortunately, I was not able to speak with the patient in regards to recent hospital discharge   Left a HIPAA compliant phone message for patient including VBCI CM contact information with request for a call back in regard to recent hospital discharge   Follow Up Plan: Additional outreach attempts will be made to reach the patient to complete the Transitions of Care (Post Inpatient/ED visit) call.   James Mcardle , BSN, RN [facility]   VBCI-Population Health RN Care Manager Direct Dial [phone]  Fax: [phone] Website: [email or web address]

## 2024-04-05 ENCOUNTER — Encounter (HOSPITAL_COMMUNITY): Payer: Self-pay

## 2024-04-05 ENCOUNTER — Inpatient Hospital Stay (HOSPITAL_COMMUNITY)
Admission: EM | Admit: 2024-04-05 | Discharge: 2024-04-18 | DRG: 474 | Disposition: A | Discharge status: Skilled Nursing Facility | Attending: Internal Medicine | Admitting: Internal Medicine

## 2024-04-05 ENCOUNTER — Emergency Department (HOSPITAL_COMMUNITY)

## 2024-04-05 ENCOUNTER — Telehealth: Payer: Self-pay

## 2024-04-05 ENCOUNTER — Other Ambulatory Visit: Payer: Self-pay

## 2024-04-05 DIAGNOSIS — Z794 Long term (current) use of insulin: Secondary | ICD-10-CM | POA: Diagnosis not present

## 2024-04-05 DIAGNOSIS — Z79899 Other long term (current) drug therapy: Secondary | ICD-10-CM

## 2024-04-05 DIAGNOSIS — E1169 Type 2 diabetes mellitus with other specified complication: Secondary | ICD-10-CM | POA: Diagnosis not present

## 2024-04-05 DIAGNOSIS — E1165 Type 2 diabetes mellitus with hyperglycemia: Secondary | ICD-10-CM | POA: Diagnosis not present

## 2024-04-05 DIAGNOSIS — G546 Phantom limb syndrome with pain: Secondary | ICD-10-CM | POA: Diagnosis not present

## 2024-04-05 DIAGNOSIS — Z89612 Acquired absence of left leg above knee: Secondary | ICD-10-CM

## 2024-04-05 DIAGNOSIS — F17210 Nicotine dependence, cigarettes, uncomplicated: Secondary | ICD-10-CM | POA: Diagnosis present

## 2024-04-05 DIAGNOSIS — E785 Hyperlipidemia, unspecified: Secondary | ICD-10-CM | POA: Diagnosis present

## 2024-04-05 DIAGNOSIS — M869 Osteomyelitis, unspecified: Secondary | ICD-10-CM | POA: Diagnosis present

## 2024-04-05 DIAGNOSIS — Z8616 Personal history of COVID-19: Secondary | ICD-10-CM

## 2024-04-05 DIAGNOSIS — K3184 Gastroparesis: Secondary | ICD-10-CM | POA: Insufficient documentation

## 2024-04-05 DIAGNOSIS — E11621 Type 2 diabetes mellitus with foot ulcer: Secondary | ICD-10-CM

## 2024-04-05 DIAGNOSIS — A419 Sepsis, unspecified organism: Principal | ICD-10-CM

## 2024-04-05 DIAGNOSIS — J9601 Acute respiratory failure with hypoxia: Secondary | ICD-10-CM | POA: Diagnosis not present

## 2024-04-05 DIAGNOSIS — I130 Hypertensive heart and chronic kidney disease with heart failure and stage 1 through stage 4 chronic kidney disease, or unspecified chronic kidney disease: Secondary | ICD-10-CM | POA: Diagnosis present

## 2024-04-05 DIAGNOSIS — Z90710 Acquired absence of both cervix and uterus: Secondary | ICD-10-CM

## 2024-04-05 DIAGNOSIS — Z7982 Long term (current) use of aspirin: Secondary | ICD-10-CM

## 2024-04-05 DIAGNOSIS — N3946 Mixed incontinence: Secondary | ICD-10-CM | POA: Diagnosis present

## 2024-04-05 DIAGNOSIS — S91301A Unspecified open wound, right foot, initial encounter: Secondary | ICD-10-CM | POA: Diagnosis not present

## 2024-04-05 DIAGNOSIS — E1122 Type 2 diabetes mellitus with diabetic chronic kidney disease: Secondary | ICD-10-CM | POA: Diagnosis present

## 2024-04-05 DIAGNOSIS — T8141XA Infection following a procedure, superficial incisional surgical site, initial encounter: Secondary | ICD-10-CM | POA: Diagnosis not present

## 2024-04-05 DIAGNOSIS — Z5989 Other problems related to housing and economic circumstances: Secondary | ICD-10-CM | POA: Diagnosis not present

## 2024-04-05 DIAGNOSIS — F1120 Opioid dependence, uncomplicated: Secondary | ICD-10-CM | POA: Diagnosis present

## 2024-04-05 DIAGNOSIS — Z7985 Long-term (current) use of injectable non-insulin antidiabetic drugs: Secondary | ICD-10-CM

## 2024-04-05 DIAGNOSIS — E66812 Obesity, class 2: Secondary | ICD-10-CM | POA: Diagnosis present

## 2024-04-05 DIAGNOSIS — I5032 Chronic diastolic (congestive) heart failure: Secondary | ICD-10-CM | POA: Diagnosis present

## 2024-04-05 DIAGNOSIS — Z0389 Encounter for observation for other suspected diseases and conditions ruled out: Secondary | ICD-10-CM | POA: Diagnosis not present

## 2024-04-05 DIAGNOSIS — I739 Peripheral vascular disease, unspecified: Secondary | ICD-10-CM | POA: Diagnosis present

## 2024-04-05 DIAGNOSIS — K219 Gastro-esophageal reflux disease without esophagitis: Secondary | ICD-10-CM | POA: Diagnosis present

## 2024-04-05 DIAGNOSIS — Z5982 Transportation insecurity: Secondary | ICD-10-CM | POA: Diagnosis not present

## 2024-04-05 DIAGNOSIS — B3731 Acute candidiasis of vulva and vagina: Secondary | ICD-10-CM | POA: Diagnosis present

## 2024-04-05 DIAGNOSIS — Z95820 Peripheral vascular angioplasty status with implants and grafts: Secondary | ICD-10-CM

## 2024-04-05 DIAGNOSIS — M797 Fibromyalgia: Secondary | ICD-10-CM | POA: Diagnosis present

## 2024-04-05 DIAGNOSIS — M86171 Other acute osteomyelitis, right ankle and foot: Secondary | ICD-10-CM | POA: Diagnosis not present

## 2024-04-05 DIAGNOSIS — R739 Hyperglycemia, unspecified: Secondary | ICD-10-CM | POA: Diagnosis not present

## 2024-04-05 DIAGNOSIS — Z91148 Patient's other noncompliance with medication regimen for other reason: Secondary | ICD-10-CM

## 2024-04-05 DIAGNOSIS — F32A Depression, unspecified: Secondary | ICD-10-CM | POA: Diagnosis present

## 2024-04-05 DIAGNOSIS — E871 Hypo-osmolality and hyponatremia: Secondary | ICD-10-CM | POA: Diagnosis present

## 2024-04-05 DIAGNOSIS — E1142 Type 2 diabetes mellitus with diabetic polyneuropathy: Secondary | ICD-10-CM | POA: Diagnosis present

## 2024-04-05 DIAGNOSIS — Z9889 Other specified postprocedural states: Secondary | ICD-10-CM | POA: Diagnosis not present

## 2024-04-05 DIAGNOSIS — E1152 Type 2 diabetes mellitus with diabetic peripheral angiopathy with gangrene: Secondary | ICD-10-CM | POA: Diagnosis present

## 2024-04-05 DIAGNOSIS — Z993 Dependence on wheelchair: Secondary | ICD-10-CM

## 2024-04-05 DIAGNOSIS — T8743 Infection of amputation stump, right lower extremity: Secondary | ICD-10-CM | POA: Diagnosis not present

## 2024-04-05 DIAGNOSIS — Z5986 Financial insecurity: Secondary | ICD-10-CM

## 2024-04-05 DIAGNOSIS — Z91041 Radiographic dye allergy status: Secondary | ICD-10-CM

## 2024-04-05 DIAGNOSIS — Z7984 Long term (current) use of oral hypoglycemic drugs: Secondary | ICD-10-CM

## 2024-04-05 DIAGNOSIS — M549 Dorsalgia, unspecified: Secondary | ICD-10-CM | POA: Diagnosis present

## 2024-04-05 DIAGNOSIS — Z8249 Family history of ischemic heart disease and other diseases of the circulatory system: Secondary | ICD-10-CM

## 2024-04-05 DIAGNOSIS — Z6836 Body mass index (BMI) 36.0-36.9, adult: Secondary | ICD-10-CM

## 2024-04-05 DIAGNOSIS — Z89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): Secondary | ICD-10-CM | POA: Diagnosis present

## 2024-04-05 DIAGNOSIS — T8789 Other complications of amputation stump: Secondary | ICD-10-CM | POA: Diagnosis present

## 2024-04-05 DIAGNOSIS — I10 Essential (primary) hypertension: Secondary | ICD-10-CM | POA: Diagnosis not present

## 2024-04-05 DIAGNOSIS — E1151 Type 2 diabetes mellitus with diabetic peripheral angiopathy without gangrene: Secondary | ICD-10-CM | POA: Diagnosis not present

## 2024-04-05 DIAGNOSIS — J4489 Other specified chronic obstructive pulmonary disease: Secondary | ICD-10-CM | POA: Diagnosis present

## 2024-04-05 DIAGNOSIS — R6889 Other general symptoms and signs: Secondary | ICD-10-CM | POA: Diagnosis not present

## 2024-04-05 DIAGNOSIS — Y835 Amputation of limb(s) as the cause of abnormal reaction of the patient, or of later complication, without mention of misadventure at the time of the procedure: Secondary | ICD-10-CM | POA: Diagnosis present

## 2024-04-05 DIAGNOSIS — E1143 Type 2 diabetes mellitus with diabetic autonomic (poly)neuropathy: Secondary | ICD-10-CM | POA: Diagnosis present

## 2024-04-05 DIAGNOSIS — Z89431 Acquired absence of right foot: Secondary | ICD-10-CM | POA: Diagnosis present

## 2024-04-05 DIAGNOSIS — N182 Chronic kidney disease, stage 2 (mild): Secondary | ICD-10-CM | POA: Diagnosis present

## 2024-04-05 DIAGNOSIS — L089 Local infection of the skin and subcutaneous tissue, unspecified: Secondary | ICD-10-CM | POA: Diagnosis not present

## 2024-04-05 DIAGNOSIS — Z86718 Personal history of other venous thrombosis and embolism: Secondary | ICD-10-CM

## 2024-04-05 LAB — COMPREHENSIVE METABOLIC PANEL WITH GFR
ALT: 15 U/L (ref 0–44)
AST: 22 U/L (ref 15–41)
Albumin: 2.4 g/dL — ABNORMAL LOW (ref 3.5–5.0)
Alkaline Phosphatase: 76 U/L (ref 38–126)
Anion gap: 11 (ref 5–15)
BUN: 11 mg/dL (ref 8–23)
CO2: 24 mmol/L (ref 22–32)
Calcium: 8.9 mg/dL (ref 8.9–10.3)
Chloride: 96 mmol/L — ABNORMAL LOW (ref 98–111)
Creatinine, Ser: 1.13 mg/dL — ABNORMAL HIGH (ref 0.44–1.00)
GFR, Estimated: 54 mL/min — ABNORMAL LOW (ref >60)
Glucose, Bld: 408 mg/dL — ABNORMAL HIGH (ref 70–99)
Potassium: 4.5 mmol/L (ref 3.5–5.1)
Sodium: 131 mmol/L — ABNORMAL LOW (ref 135–145)
Total Bilirubin: 0.7 mg/dL (ref 0.0–1.2)
Total Protein: 6.5 g/dL (ref 6.5–8.1)

## 2024-04-05 LAB — CBC WITH DIFFERENTIAL/PLATELET
Abs Immature Granulocytes: 0.09 10*3/uL — ABNORMAL HIGH (ref 0.00–0.07)
Basophils Absolute: 0.1 10*3/uL (ref 0.0–0.1)
Basophils Relative: 0 %
Eosinophils Absolute: 0.1 10*3/uL (ref 0.0–0.5)
Eosinophils Relative: 0 %
HCT: 34.5 % — ABNORMAL LOW (ref 36.0–46.0)
Hemoglobin: 11.2 g/dL — ABNORMAL LOW (ref 12.0–15.0)
Immature Granulocytes: 1 %
Lymphocytes Relative: 16 %
Lymphs Abs: 2.6 10*3/uL (ref 0.7–4.0)
MCH: 30.6 pg (ref 26.0–34.0)
MCHC: 32.5 g/dL (ref 30.0–36.0)
MCV: 94.3 fL (ref 80.0–100.0)
Monocytes Absolute: 0.9 10*3/uL (ref 0.1–1.0)
Monocytes Relative: 6 %
Neutro Abs: 12.3 10*3/uL — ABNORMAL HIGH (ref 1.7–7.7)
Neutrophils Relative %: 77 %
Platelets: 268 10*3/uL (ref 150–400)
RBC: 3.66 MIL/uL — ABNORMAL LOW (ref 3.87–5.11)
RDW: 13.5 % (ref 11.5–15.5)
WBC: 16 10*3/uL — ABNORMAL HIGH (ref 4.0–10.5)
nRBC: 0 % (ref 0.0–0.2)

## 2024-04-05 LAB — URINALYSIS, W/ REFLEX TO CULTURE (INFECTION SUSPECTED)
Bilirubin Urine: NEGATIVE
Glucose, UA: >=500 mg/dL — AB
Ketones, ur: NEGATIVE mg/dL
Leukocytes,Ua: NEGATIVE
Nitrite: NEGATIVE
Protein, ur: NEGATIVE mg/dL
Specific Gravity, Urine: 1.021 (ref 1.005–1.030)
pH: 6 (ref 5.0–8.0)

## 2024-04-05 LAB — AEROBIC/ANAEROBIC CULTURE W GRAM STAIN (SURGICAL/DEEP WOUND): Gram Stain: NONE SEEN

## 2024-04-05 LAB — PROTIME-INR
INR: 1.3 — ABNORMAL HIGH (ref 0.8–1.2)
Prothrombin Time: 16.7 s — ABNORMAL HIGH (ref 11.4–15.2)

## 2024-04-05 LAB — I-STAT CG4 LACTIC ACID, ED: Lactic Acid, Venous: 1.2 mmol/L (ref 0.5–1.9)

## 2024-04-05 MED ORDER — VANCOMYCIN HCL IN DEXTROSE 1-5 GM/200ML-% IV SOLN: 1000.0000 mg | Freq: Once | INTRAVENOUS | Status: DC

## 2024-04-05 MED ORDER — VANCOMYCIN HCL 2000 MG/400ML IV SOLN
2000.0000 mg | Freq: Once | INTRAVENOUS | Status: AC
  Administered 2024-04-06: 2000 mg via INTRAVENOUS
  Filled 2024-04-05: qty 400

## 2024-04-05 MED ORDER — LACTATED RINGERS IV BOLUS (SEPSIS)
1000.0000 mL | Freq: Once | INTRAVENOUS | Status: AC
  Administered 2024-04-06: 1000 mL via INTRAVENOUS

## 2024-04-05 MED ORDER — SODIUM CHLORIDE 0.9 % IV SOLN
2.0000 g | Freq: Once | INTRAVENOUS | Status: AC
  Administered 2024-04-05: 2 g via INTRAVENOUS
  Filled 2024-04-05: qty 12.5

## 2024-04-05 MED ORDER — METRONIDAZOLE 500 MG/100ML IV SOLN
500.0000 mg | Freq: Once | INTRAVENOUS | Status: AC
  Administered 2024-04-05: 500 mg via INTRAVENOUS
  Filled 2024-04-05: qty 100

## 2024-04-05 MED ORDER — LACTATED RINGERS IV SOLN: INTRAVENOUS | Status: AC

## 2024-04-05 NOTE — Progress Notes (Signed)
 Elink monitoring for the code sepsis protocol.

## 2024-04-05 NOTE — ED Provider Notes (Signed)
  Physical Exam  BP (!) 138/59   Pulse 83   Temp (!) 100.8 F (38.2 C) (Oral)   Resp (!) 25   Ht 5\' 5"  (1.651 m)   Wt 99.8 kg   SpO2 93%   BMI 36.61 kg/m   Physical Exam  Procedures  Procedures  ED Course / MDM    Medical Decision Making Amount and/or Complexity of Data Reviewed Labs: ordered. Radiology: ordered.  Risk Prescription drug management.   Amputation of toe 4-5 days Here today with alarm on wound vac Home on Augmentin - compliant except for today "too sick to take it" Fever today, WBC SIRS criteria = code sepsis Lactic pending Needs admission for infection Cefepime, vanc and Flagyl today for unknown source

## 2024-04-05 NOTE — ED Provider Notes (Signed)
 [place] EMERGENCY DEPARTMENT AT [facility] Provider Note   CSN: [id number] Arrival date & time: 04/05/24  2030     History  Chief Complaint  Patient presents with   Wound Check    Jennifer [name] is a 63 y.o. female.  Patient with past medical history significant for type II DM, chronic back pain, PAD, chronic diastolic heart failure, CKD stage II, status post excision of metatarsal bone had of the fifth metatarsal on the right side on April 8 presents to the emergency department via EMS complaining of wound VAC malfunction.  She complains that the wound VAC is beeping stating there is a leak in the system.  Upon arrival she is noted to be febrile with a temperature of 100.8 degrees and does meet SIRS criteria. She denies nausea, vomiting, urinary symptoms. She does endorse shortness of breath.  The patient was sent home on a 3-day course of Augmentin and states she completed 2 days of the medication but did not take her medication today due to feeling unwell.   Wound Check       Home Medications Prior to Admission medications   Medication Sig Start Date End Date Taking? Authorizing Provider  Accu-Chek Softclix Lancets lancets Use as back up to CGM sensors up to 1 time a day 11/17/23   Sherol Dixie, MD  acetaminophen (TYLENOL) 650 MG CR tablet Take 2 tablets (1,300 mg total) by mouth every 8 (eight) hours as needed for pain. Patient taking differently: Take 1,950 mg by mouth daily as needed for pain. 02/16/23   Mapp, Tavien, MD  albuterol (PROAIR HFA) 108 (90 Base) MCG/ACT inhaler INHALE 2 PUFFS BY MOUTH EVERY 6 HOURS AS NEEDED FOR WHEEZING 11/04/23   Sherol Dixie, MD  amLODipine (NORVASC) 10 MG tablet Take 1 tablet (10 mg total) by mouth daily. 08/06/23   Sherol Dixie, MD  aspirin EC 81 MG tablet Take 1 tablet (81 mg total) by mouth daily. Patient not taking: Reported on 03/25/2024 09/19/21   Sherol Dixie, MD  Baclofen 5 MG TABS Take 1  tablet (5 mg total) by mouth 2 (two) times daily as needed (muscle spasms). 02/18/24   Sherol Dixie, MD  Blood Glucose Monitoring Suppl (ACCU-CHEK GUIDE) w/Device KIT Use to check blood sugar up to 1 times a day 11/17/23   Sherol Dixie, MD  Continuous Glucose Sensor (DEXCOM G7 SENSOR) MISC Place new sensor every 10 days. Use to monitor blood sugar continuously. 11/17/23   Sherol Dixie, MD  DULoxetine (CYMBALTA) 60 MG capsule Take 1 capsule (60 mg total) by mouth daily. 02/18/24   Sherol Dixie, MD  glucose blood (ACCU-CHEK GUIDE TEST) test strip Use as back up to CGM sensors up to 1 time a day 11/17/23   Sherol Dixie, MD  insulin degludec (TRESIBA FLEXTOUCH) 200 UNIT/ML FlexTouch Pen Inject 65 Units into the skin daily. 02/18/24   Sherol Dixie, MD  Insulin Pen Needle 32G X 4 MM MISC Use to inject insulin 4 (four) times daily. 02/18/24   Sherol Dixie, MD  lidocaine (LIDODERM) 5 % Place 1 patch onto the skin daily. Remove & Discard patch within 12 hours or as directed by MD 03/15/24   Almond Army, MD  metFORMIN (GLUCOPHAGE-XR) 500 MG 24 hr tablet Take 1 tablet (500 mg total) by mouth daily with breakfast. 08/06/23   Sherol Dixie, MD  methocarbamol (ROBAXIN) 500 MG tablet Take 1 tablet (500 mg  total) by mouth 2 (two) times daily. Patient taking differently: Take 500 mg by mouth 3 (three) times daily as needed for muscle spasms. 03/15/24   Almond Army, MD  metoCLOPramide (REGLAN) 5 MG tablet Take 1 tablet (5 mg total) by mouth 3 (three) times daily before meals. 02/18/24   Sherol Dixie, MD  metoprolol succinate (TOPROL-XL) 100 MG 24 hr tablet Take 1 tablet (100 mg total) by mouth daily. TAKE WITH OR IMMEDIATELY FOLLOWING A MEAL. 02/18/24   Sherol Dixie, MD  MYRBETRIQ 50 MG TB24 tablet Take 1 tablet (50 mg total) by mouth daily. 02/18/24   Sherol Dixie, MD  ondansetron (ZOFRAN-ODT) 4 MG disintegrating tablet Take 1  tablet (4 mg total) by mouth every 8 (eight) hours as needed for nausea or vomiting. Patient taking differently: Take 4 mg by mouth daily as needed for nausea or vomiting. 02/18/24   Sherol Dixie, MD  oxyCODONE (OXY IR/ROXICODONE) 5 MG immediate release tablet Take 1 tablet (5 mg total) by mouth every 4 (four) hours as needed for up to 5 days. 03/31/24 04/05/24  Sherol Dixie, MD  pantoprazole (PROTONIX) 40 MG tablet Take 1 tablet (40 mg total) by mouth daily. 07/14/23   Sherol Dixie, MD  pregabalin (LYRICA) 200 MG capsule Take 1 capsule (200 mg total) by mouth 3 (three) times daily. 03/10/24   Sherol Dixie, MD  rosuvastatin (CRESTOR) 20 MG tablet Take 1 tablet (20 mg total) by mouth in the morning. 02/18/24   Sherol Dixie, MD  Semaglutide, 1 MG/DOSE, (OZEMPIC, 1 MG/DOSE,) 4 MG/3ML SOPN Inject 1 mg into the skin once a week. 08/06/23   Sherol Dixie, MD  silver sulfADIAZINE (SILVADENE) 1 % cream Apply 1 Application topically daily. To the wounds on your leg and foot 03/22/24   Almond Army, MD  spironolactone (ALDACTONE) 50 MG tablet Take 1 tablet (50 mg total) by mouth daily. Patient taking differently: Take 50 mg by mouth 2 (two) times daily. 02/18/24   Sherol Dixie, MD  traZODone (DESYREL) 100 MG tablet Take 200 mg by mouth at bedtime as needed for sleep.    [provider]  umeclidinium-vilanterol (ANORO ELLIPTA) 62.5-25 MCG/ACT AEPB Inhale 1 puff into the lungs in the morning. 08/06/23   Sherol Dixie, MD  Potassium Chloride ER 20 MEQ TBCR Take 40 mEq by mouth daily. 01/02/14 01/23/14  Sharda, Neema K, MD      Allergies    Benazepril, Chantix [varenicline], Ioversol, Morphine sulfate, Abilify [aripiprazole], Iohexol, and Ivp dye [iodinated contrast media]    Review of Systems   Review of Systems  Physical Exam Updated Vital Signs BP 138/62   Pulse 88   Temp (!) 100.8 F (38.2 C) (Oral)   Resp (!) 21   Ht 5\' 5"  (1.651 m)    Wt 99.8 kg   SpO2 95%   BMI 36.61 kg/m  Physical Exam Vitals and nursing note reviewed.  Constitutional:      General: She is not in acute distress.    Appearance: She is well-developed.  HENT:     Head: Normocephalic and atraumatic.  Eyes:     Conjunctiva/sclera: Conjunctivae normal.  Cardiovascular:     Rate and Rhythm: Normal rate and regular rhythm.  Pulmonary:     Effort: Pulmonary effort is normal. No respiratory distress.     Breath sounds: Normal breath sounds.  Abdominal:     Palpations: Abdomen is soft.     Tenderness:  There is no abdominal tenderness.  Musculoskeletal:     Cervical back: Neck supple.     Comments: Wound VAC in place on right lower extremity.  No significant drainage appreciated.  Skin:    General: Skin is warm and dry.     Capillary Refill: Capillary refill takes less than 2 seconds.  Neurological:     Mental Status: She is alert.  Psychiatric:        Mood and Affect: Mood normal.     ED Results / Procedures / Treatments   Labs (all labs ordered are listed, but only abnormal results are displayed) Labs Reviewed  COMPREHENSIVE METABOLIC PANEL WITH GFR - Abnormal; Notable for the following components:      Result Value   Sodium 131 (*)    Chloride 96 (*)    Glucose, Bld 408 (*)    Creatinine, Ser 1.13 (*)    Albumin 2.4 (*)    GFR, Estimated 54 (*)    All other components within normal limits  CBC WITH DIFFERENTIAL/PLATELET - Abnormal; Notable for the following components:   WBC 16.0 (*)    RBC 3.66 (*)    Hemoglobin 11.2 (*)    HCT 34.5 (*)    Neutro Abs 12.3 (*)    Abs Immature Granulocytes 0.09 (*)    All other components within normal limits  PROTIME-INR - Abnormal; Notable for the following components:   Prothrombin Time 16.7 (*)    INR 1.3 (*)    All other components within normal limits  CULTURE, BLOOD (ROUTINE X 2)  CULTURE, BLOOD (ROUTINE X 2)  URINALYSIS, W/ REFLEX TO CULTURE (INFECTION SUSPECTED)  I-STAT CG4 LACTIC  ACID, ED  I-STAT CG4 LACTIC ACID, ED    EKG None  Radiology DG Chest Port 1 View Result Date: 04/05/2024 CLINICAL DATA:  Possible sepsis EXAM: PORTABLE CHEST 1 VIEW COMPARISON:  03/15/2024 FINDINGS: The heart size and mediastinal contours are within normal limits. Both lungs are clear. The visualized skeletal structures are unremarkable. IMPRESSION: No active disease. Electronically Signed   By: Violeta Grey M.D.   On: 04/05/2024 21:55   DG Foot 2 Views Right Result Date: 04/05/2024 CLINICAL DATA:  Right foot wound, initial encounter EXAM: RIGHT FOOT - 2 VIEW COMPARISON:  03/29/2024 FINDINGS: Wound VAC is again present laterally. Multiple antibiotic beads are again seen but decreased in number. Large soft tissue defect is noted new from the prior exam fifth metatarsal has been surgically removed. 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 No definitive erosive changes are seen. IMPRESSION: Enlarging lateral soft tissue wound with wound VAC in place. Electronically Signed   By: Violeta Grey M.D.   On: 04/05/2024 21:54    Procedures .Critical Care  Performed by: Elisa Guest, PA-C Authorized by: Elisa Guest, PA-C   Critical care provider statement:    Critical care time (minutes):  30   Critical care time was exclusive of:  Separately billable procedures and treating other patients   Critical care was necessary to treat or prevent imminent or life-threatening deterioration of the following conditions:  Sepsis   Critical care was time spent personally by me on the following activities:  Development of treatment plan with patient or surrogate, discussions with consultants, evaluation of patient's response to treatment, examination of patient, ordering and review of laboratory studies, ordering and review of radiographic studies, ordering and performing treatments and interventions, pulse oximetry, re-evaluation of patient's condition and review of old  charts  Medications Ordered in ED Medications  lactated ringers infusion (has no administration in time range)  lactated ringers bolus 1,000 mL (has no administration in time range)  ceFEPIme (MAXIPIME) 2 g in sodium chloride 0.9 % 100 mL IVPB (2 g Intravenous New Bag/Given 04/05/24 2312)  metroNIDAZOLE (FLAGYL) IVPB 500 mg (500 mg Intravenous New Bag/Given 04/05/24 2311)  vancomycin (VANCOREADY) IVPB 2000 mg/400 mL (has no administration in time range)    ED Course/ Medical Decision Making/ A&P                                 Medical Decision Making Amount and/or Complexity of Data Reviewed Labs: ordered. Radiology: ordered.  Risk Prescription drug management.   This patient presents to the ED for concern of wound VAC malfunction, this involves an extensive number of treatment options, and is a complaint that carries with it a high risk of complications and morbidity.  The differential diagnosis includes nonfunctional function, worsening infection, others   Co morbidities that complicate the patient evaluation  Type II DM, CKD stage II, recent surgery   Additional history obtained:  Additional history obtained from EMS External records from outside source obtained and reviewed including discharge notes   Lab Tests:  I Ordered, and personally interpreted labs.  The pertinent results include: Leukocytosis with a white count of 16,000   Imaging Studies ordered:  I ordered imaging studies including plain films of the chest and right foot I independently visualized and interpreted imaging which showed no acute chest findings,Enlarging lateral soft tissue wound with wound VAC in place.  I agree with the radiologist interpretation   Cardiac Monitoring: / EKG:  The patient was maintained on a cardiac monitor.  I personally viewed and interpreted the cardiac monitored which showed an underlying rhythm of: Sinus rhythm   Problem List / ED Course / Critical  interventions / Medication management   I ordered medication including vancomycin, cefepime, Flagyl for sepsis, lactated Ringer's bolus for sepsis Reevaluation of the patient after these medicines showed that the patient stayed the same I have reviewed the patients home medicines and have made adjustments as needed    Social Determinants of Health:  Social Drivers of Health with Concerns   Tobacco Use: High Risk (04/05/2024)   Patient History    Smoking Tobacco Use: Every Day    Smokeless Tobacco Use: Never    Passive Exposure: Not on file  Financial Resource Strain: High Risk (03/31/2023)   Overall Financial Resource Strain (CARDIA)    Difficulty of Paying Living Expenses: Hard  Transportation Needs: Unmet Transportation Needs (03/24/2024)   PRAPARE - Transportation    Lack of Transportation (Medical): Yes    Lack of Transportation (Non-Medical): Yes  Physical Activity: Inactive (03/31/2023)   Exercise Vital Sign    Days of Exercise per Week: 0 days    Minutes of Exercise per Session: 0 min  Stress: Stress Concern Present (03/31/2023)   [employer] of Occupational Health - Occupational Stress Questionnaire    Feeling of Stress : Very much  Social Connections: Moderately Isolated (03/24/2024)   Social Connection and Isolation Panel [NHANES]    Frequency of Communication with Friends and Family: More than three times a week    Frequency of Social Gatherings with Friends and Family: More than three times a week    Attends Religious Services: More than 4 times per year    Active Member of Clubs or  Organizations: No    Attends Banker Meetings: Never    Marital Status: Divorced  Depression (PHQ2-9): High Risk (10/08/2023)   Depression (PHQ2-9)    PHQ-2 Score: 11  Utilities: At Risk (03/24/2024)   Utilities    Threatened with loss of utilities: Yes  Health Literacy: Not on file      Test / Admission - Considered:  Patient with imaging showing signs of possibly  worsening wound of the right foot.  Antibiotics initiated and code sepsis activated.  Patient will likely need admission for IV antibiotics and further management. Patient care signed out to Mandy Second, PA-C at shift handoff         Final Clinical Impression(s) / ED Diagnoses Final diagnoses:  Sepsis, due to unspecified organism, unspecified whether acute organ dysfunction present [facility])    Rx / DC Orders ED Discharge Orders     None         Delories Fetter 04/05/24 2342    Jerilynn Montenegro, MD 04/06/24 [phone]

## 2024-04-05 NOTE — ED Triage Notes (Signed)
 Patient comes in from home via EMS with complaints of wound vac machine alarming signifying a leak. 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 Patient ranks foot pain a 10 out of 10. Patient also reports chills.

## 2024-04-05 NOTE — Transitions of Care (Post Inpatient/ED Visit) (Signed)
   04/05/2024  Name: Jennifer [name] MRN: [id number] DOB: 12-21-1961  Today's TOC FU Call Status: Today's TOC FU Call Status:: Unsuccessful Call (3rd Attempt) Unsuccessful Call (3rd Attempt) Date: 04/05/24  Attempted to reach the patient regarding the most recent Inpatient/ED visit.  Follow Up Plan: No further outreach attempts will be made at this time. We have been unable to contact the patient.  Tonia Frankel RN, CCM [place]  VBCI-Population Health RN Care Manager [phone]

## 2024-04-06 ENCOUNTER — Other Ambulatory Visit: Payer: Self-pay | Admitting: Internal Medicine

## 2024-04-06 ENCOUNTER — Other Ambulatory Visit (HOSPITAL_COMMUNITY): Payer: Self-pay

## 2024-04-06 ENCOUNTER — Inpatient Hospital Stay (HOSPITAL_COMMUNITY)

## 2024-04-06 ENCOUNTER — Other Ambulatory Visit: Payer: Self-pay

## 2024-04-06 DIAGNOSIS — M6281 Muscle weakness (generalized): Secondary | ICD-10-CM | POA: Diagnosis not present

## 2024-04-06 DIAGNOSIS — K3184 Gastroparesis: Secondary | ICD-10-CM | POA: Diagnosis present

## 2024-04-06 DIAGNOSIS — T8743 Infection of amputation stump, right lower extremity: Secondary | ICD-10-CM | POA: Diagnosis present

## 2024-04-06 DIAGNOSIS — S91301A Unspecified open wound, right foot, initial encounter: Secondary | ICD-10-CM | POA: Diagnosis not present

## 2024-04-06 DIAGNOSIS — E871 Hypo-osmolality and hyponatremia: Secondary | ICD-10-CM | POA: Diagnosis present

## 2024-04-06 DIAGNOSIS — M86161 Other acute osteomyelitis, right tibia and fibula: Secondary | ICD-10-CM | POA: Diagnosis not present

## 2024-04-06 DIAGNOSIS — R0602 Shortness of breath: Secondary | ICD-10-CM | POA: Diagnosis not present

## 2024-04-06 DIAGNOSIS — Z794 Long term (current) use of insulin: Secondary | ICD-10-CM | POA: Diagnosis not present

## 2024-04-06 DIAGNOSIS — E1142 Type 2 diabetes mellitus with diabetic polyneuropathy: Secondary | ICD-10-CM | POA: Diagnosis not present

## 2024-04-06 DIAGNOSIS — Y835 Amputation of limb(s) as the cause of abnormal reaction of the patient, or of later complication, without mention of misadventure at the time of the procedure: Secondary | ICD-10-CM | POA: Diagnosis present

## 2024-04-06 DIAGNOSIS — R279 Unspecified lack of coordination: Secondary | ICD-10-CM | POA: Diagnosis not present

## 2024-04-06 DIAGNOSIS — E119 Type 2 diabetes mellitus without complications: Secondary | ICD-10-CM

## 2024-04-06 DIAGNOSIS — L089 Local infection of the skin and subcutaneous tissue, unspecified: Secondary | ICD-10-CM | POA: Diagnosis present

## 2024-04-06 DIAGNOSIS — Z89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): Secondary | ICD-10-CM | POA: Insufficient documentation

## 2024-04-06 DIAGNOSIS — E1169 Type 2 diabetes mellitus with other specified complication: Secondary | ICD-10-CM | POA: Diagnosis not present

## 2024-04-06 DIAGNOSIS — T8141XA Infection following a procedure, superficial incisional surgical site, initial encounter: Secondary | ICD-10-CM

## 2024-04-06 DIAGNOSIS — I130 Hypertensive heart and chronic kidney disease with heart failure and stage 1 through stage 4 chronic kidney disease, or unspecified chronic kidney disease: Secondary | ICD-10-CM | POA: Diagnosis not present

## 2024-04-06 DIAGNOSIS — E1122 Type 2 diabetes mellitus with diabetic chronic kidney disease: Secondary | ICD-10-CM | POA: Diagnosis not present

## 2024-04-06 DIAGNOSIS — M869 Osteomyelitis, unspecified: Secondary | ICD-10-CM | POA: Diagnosis not present

## 2024-04-06 DIAGNOSIS — E66812 Obesity, class 2: Secondary | ICD-10-CM | POA: Diagnosis present

## 2024-04-06 DIAGNOSIS — R262 Difficulty in walking, not elsewhere classified: Secondary | ICD-10-CM | POA: Diagnosis not present

## 2024-04-06 DIAGNOSIS — M86171 Other acute osteomyelitis, right ankle and foot: Secondary | ICD-10-CM

## 2024-04-06 DIAGNOSIS — T8781 Dehiscence of amputation stump: Secondary | ICD-10-CM | POA: Diagnosis not present

## 2024-04-06 DIAGNOSIS — L03115 Cellulitis of right lower limb: Secondary | ICD-10-CM | POA: Diagnosis not present

## 2024-04-06 DIAGNOSIS — F32A Depression, unspecified: Secondary | ICD-10-CM | POA: Diagnosis present

## 2024-04-06 DIAGNOSIS — A419 Sepsis, unspecified organism: Secondary | ICD-10-CM | POA: Diagnosis not present

## 2024-04-06 DIAGNOSIS — E1151 Type 2 diabetes mellitus with diabetic peripheral angiopathy without gangrene: Secondary | ICD-10-CM | POA: Diagnosis not present

## 2024-04-06 DIAGNOSIS — T8144XA Sepsis following a procedure, initial encounter: Secondary | ICD-10-CM | POA: Diagnosis not present

## 2024-04-06 DIAGNOSIS — Z5989 Other problems related to housing and economic circumstances: Secondary | ICD-10-CM | POA: Diagnosis not present

## 2024-04-06 DIAGNOSIS — E1152 Type 2 diabetes mellitus with diabetic peripheral angiopathy with gangrene: Secondary | ICD-10-CM | POA: Diagnosis present

## 2024-04-06 DIAGNOSIS — G546 Phantom limb syndrome with pain: Secondary | ICD-10-CM | POA: Diagnosis present

## 2024-04-06 DIAGNOSIS — J4489 Other specified chronic obstructive pulmonary disease: Secondary | ICD-10-CM | POA: Diagnosis present

## 2024-04-06 DIAGNOSIS — R2689 Other abnormalities of gait and mobility: Secondary | ICD-10-CM | POA: Diagnosis not present

## 2024-04-06 DIAGNOSIS — K219 Gastro-esophageal reflux disease without esophagitis: Secondary | ICD-10-CM | POA: Diagnosis not present

## 2024-04-06 DIAGNOSIS — J9811 Atelectasis: Secondary | ICD-10-CM | POA: Diagnosis not present

## 2024-04-06 DIAGNOSIS — T8789 Other complications of amputation stump: Secondary | ICD-10-CM | POA: Diagnosis not present

## 2024-04-06 DIAGNOSIS — N182 Chronic kidney disease, stage 2 (mild): Secondary | ICD-10-CM | POA: Diagnosis not present

## 2024-04-06 DIAGNOSIS — I70201 Unspecified atherosclerosis of native arteries of extremities, right leg: Secondary | ICD-10-CM | POA: Diagnosis not present

## 2024-04-06 DIAGNOSIS — I5032 Chronic diastolic (congestive) heart failure: Secondary | ICD-10-CM | POA: Diagnosis not present

## 2024-04-06 DIAGNOSIS — E785 Hyperlipidemia, unspecified: Secondary | ICD-10-CM | POA: Diagnosis not present

## 2024-04-06 DIAGNOSIS — I700 Atherosclerosis of aorta: Secondary | ICD-10-CM | POA: Diagnosis not present

## 2024-04-06 DIAGNOSIS — Z5982 Transportation insecurity: Secondary | ICD-10-CM | POA: Diagnosis not present

## 2024-04-06 DIAGNOSIS — Z95820 Peripheral vascular angioplasty status with implants and grafts: Secondary | ICD-10-CM | POA: Diagnosis not present

## 2024-04-06 DIAGNOSIS — I10 Essential (primary) hypertension: Secondary | ICD-10-CM | POA: Diagnosis not present

## 2024-04-06 DIAGNOSIS — Z9889 Other specified postprocedural states: Secondary | ICD-10-CM | POA: Diagnosis not present

## 2024-04-06 DIAGNOSIS — J9601 Acute respiratory failure with hypoxia: Secondary | ICD-10-CM | POA: Diagnosis not present

## 2024-04-06 DIAGNOSIS — R600 Localized edema: Secondary | ICD-10-CM | POA: Diagnosis not present

## 2024-04-06 DIAGNOSIS — Z5986 Financial insecurity: Secondary | ICD-10-CM | POA: Diagnosis not present

## 2024-04-06 DIAGNOSIS — E1165 Type 2 diabetes mellitus with hyperglycemia: Secondary | ICD-10-CM | POA: Diagnosis present

## 2024-04-06 DIAGNOSIS — F1120 Opioid dependence, uncomplicated: Secondary | ICD-10-CM | POA: Diagnosis present

## 2024-04-06 DIAGNOSIS — I96 Gangrene, not elsewhere classified: Secondary | ICD-10-CM | POA: Diagnosis not present

## 2024-04-06 DIAGNOSIS — Z7401 Bed confinement status: Secondary | ICD-10-CM | POA: Diagnosis not present

## 2024-04-06 DIAGNOSIS — Z89612 Acquired absence of left leg above knee: Secondary | ICD-10-CM | POA: Diagnosis not present

## 2024-04-06 DIAGNOSIS — E1143 Type 2 diabetes mellitus with diabetic autonomic (poly)neuropathy: Secondary | ICD-10-CM | POA: Diagnosis not present

## 2024-04-06 DIAGNOSIS — Z743 Need for continuous supervision: Secondary | ICD-10-CM | POA: Diagnosis not present

## 2024-04-06 HISTORY — DX: Acquired absence of other right toe(s): Z89.421

## 2024-04-06 LAB — GLUCOSE, CAPILLARY
Glucose-Capillary: 170 mg/dL — ABNORMAL HIGH (ref 70–99)
Glucose-Capillary: 259 mg/dL — ABNORMAL HIGH (ref 70–99)
Glucose-Capillary: 185 mg/dL — ABNORMAL HIGH (ref 70–99)

## 2024-04-06 LAB — CBC
HCT: 31.1 % — ABNORMAL LOW (ref 36.0–46.0)
Hemoglobin: 10.7 g/dL — ABNORMAL LOW (ref 12.0–15.0)
MCH: 31.3 pg (ref 26.0–34.0)
MCHC: 34.4 g/dL (ref 30.0–36.0)
MCV: 90.9 fL (ref 80.0–100.0)
Platelets: 256 10*3/uL (ref 150–400)
RBC: 3.42 MIL/uL — ABNORMAL LOW (ref 3.87–5.11)
RDW: 14.5 % (ref 11.5–15.5)
WBC: 11.7 10*3/uL — ABNORMAL HIGH (ref 4.0–10.5)
nRBC: 0 % (ref 0.0–0.2)

## 2024-04-06 LAB — HEMOGLOBIN A1C
Hgb A1c MFr Bld: 9.8 % — ABNORMAL HIGH (ref 4.8–5.6)
Mean Plasma Glucose: 234.56 mg/dL

## 2024-04-06 LAB — CBG MONITORING, ED
Glucose-Capillary: 265 mg/dL — ABNORMAL HIGH (ref 70–99)
Glucose-Capillary: 314 mg/dL — ABNORMAL HIGH (ref 70–99)
Glucose-Capillary: 192 mg/dL — ABNORMAL HIGH (ref 70–99)

## 2024-04-06 MED ORDER — METRONIDAZOLE 500 MG/100ML IV SOLN
500.0000 mg | Freq: Two times a day (BID) | INTRAVENOUS | Status: DC
  Administered 2024-04-06 – 2024-04-07 (×3): 500 mg via INTRAVENOUS
  Filled 2024-04-06 (×3): qty 100

## 2024-04-06 MED ORDER — VANCOMYCIN HCL IN DEXTROSE 1-5 GM/200ML-% IV SOLN
1000.0000 mg | INTRAVENOUS | Status: DC
  Administered 2024-04-06: 1000 mg via INTRAVENOUS
  Filled 2024-04-06: qty 200

## 2024-04-06 MED ORDER — ALBUTEROL SULFATE (2.5 MG/3ML) 0.083% IN NEBU: 2.5000 mg | INHALATION_SOLUTION | Freq: Four times a day (QID) | RESPIRATORY_TRACT | Status: DC | PRN

## 2024-04-06 MED ORDER — LIVING WELL WITH DIABETES BOOK
Freq: Once | Status: AC
  Filled 2024-04-06: qty 1

## 2024-04-06 MED ORDER — ONDANSETRON 4 MG PO TBDP
4.0000 mg | ORAL_TABLET | Freq: Three times a day (TID) | ORAL | Status: DC | PRN
  Administered 2024-04-07: 4 mg via ORAL
  Filled 2024-04-06 (×2): qty 1

## 2024-04-06 MED ORDER — MIRABEGRON ER 25 MG PO TB24
50.0000 mg | ORAL_TABLET | Freq: Every day | ORAL | Status: DC
  Administered 2024-04-06 – 2024-04-18 (×13): 50 mg via ORAL
  Filled 2024-04-06 (×2): qty 2
  Filled 2024-04-06: qty 1
  Filled 2024-04-06 (×10): qty 2

## 2024-04-06 MED ORDER — DULOXETINE HCL 60 MG PO CPEP
60.0000 mg | ORAL_CAPSULE | Freq: Every day | ORAL | Status: DC
  Administered 2024-04-06 – 2024-04-18 (×13): 60 mg via ORAL
  Filled 2024-04-06 (×13): qty 1

## 2024-04-06 MED ORDER — UMECLIDINIUM-VILANTEROL 62.5-25 MCG/ACT IN AEPB
1.0000 | INHALATION_SPRAY | Freq: Every morning | RESPIRATORY_TRACT | Status: DC
  Administered 2024-04-07 – 2024-04-18 (×10): 1 via RESPIRATORY_TRACT
  Filled 2024-04-06 (×2): qty 14

## 2024-04-06 MED ORDER — OXYCODONE HCL 5 MG PO TABS
10.0000 mg | ORAL_TABLET | ORAL | Status: DC | PRN
  Administered 2024-04-06 – 2024-04-14 (×19): 10 mg via ORAL
  Filled 2024-04-06 (×19): qty 2

## 2024-04-06 MED ORDER — SENNOSIDES-DOCUSATE SODIUM 8.6-50 MG PO TABS: 1.0000 | ORAL_TABLET | Freq: Every evening | ORAL | Status: DC | PRN

## 2024-04-06 MED ORDER — ROSUVASTATIN CALCIUM 20 MG PO TABS
20.0000 mg | ORAL_TABLET | Freq: Every day | ORAL | Status: DC
Start: 2024-04-06 — End: 2024-04-18
  Administered 2024-04-06 – 2024-04-18 (×13): 20 mg via ORAL
  Filled 2024-04-06 (×13): qty 1

## 2024-04-06 MED ORDER — PANTOPRAZOLE SODIUM 40 MG PO TBEC
40.0000 mg | DELAYED_RELEASE_TABLET | Freq: Every day | ORAL | Status: DC
  Administered 2024-04-06 – 2024-04-18 (×13): 40 mg via ORAL
  Filled 2024-04-06 (×13): qty 1

## 2024-04-06 MED ORDER — INSULIN ASPART 100 UNIT/ML IJ SOLN
0.0000 [IU] | Freq: Three times a day (TID) | INTRAMUSCULAR | Status: DC
  Administered 2024-04-06: 11 [IU] via SUBCUTANEOUS
  Administered 2024-04-06: 8 [IU] via SUBCUTANEOUS
  Administered 2024-04-06 – 2024-04-07 (×2): 3 [IU] via SUBCUTANEOUS
  Administered 2024-04-07 (×2): 5 [IU] via SUBCUTANEOUS
  Administered 2024-04-08: 3 [IU] via SUBCUTANEOUS

## 2024-04-06 MED ORDER — ACETAMINOPHEN 325 MG PO TABS
650.0000 mg | ORAL_TABLET | Freq: Four times a day (QID) | ORAL | Status: DC | PRN
  Administered 2024-04-06: 650 mg via ORAL
  Filled 2024-04-06: qty 2

## 2024-04-06 MED ORDER — ACETAMINOPHEN 500 MG PO TABS
1000.0000 mg | ORAL_TABLET | Freq: Three times a day (TID) | ORAL | Status: DC
  Administered 2024-04-06 – 2024-04-18 (×36): 1000 mg via ORAL
  Filled 2024-04-06 (×37): qty 2

## 2024-04-06 MED ORDER — ENOXAPARIN SODIUM 40 MG/0.4ML IJ SOSY
40.0000 mg | PREFILLED_SYRINGE | INTRAMUSCULAR | Status: DC
  Administered 2024-04-06 – 2024-04-09 (×4): 40 mg via SUBCUTANEOUS
  Filled 2024-04-06 (×4): qty 0.4

## 2024-04-06 MED ORDER — INSULIN ASPART 100 UNIT/ML IJ SOLN
0.0000 [IU] | Freq: Every day | INTRAMUSCULAR | Status: DC
  Administered 2024-04-07: 4 [IU] via SUBCUTANEOUS

## 2024-04-06 MED ORDER — INSULIN GLARGINE-YFGN 100 UNIT/ML ~~LOC~~ SOLN
30.0000 [IU] | Freq: Every day | SUBCUTANEOUS | Status: DC
  Administered 2024-04-06 – 2024-04-07 (×2): 30 [IU] via SUBCUTANEOUS
  Filled 2024-04-06 (×3): qty 0.3

## 2024-04-06 MED ORDER — GADOBUTROL 1 MMOL/ML IV SOLN
10.0000 mL | Freq: Once | INTRAVENOUS | Status: AC | PRN
  Administered 2024-04-06: 10 mL via INTRAVENOUS

## 2024-04-06 MED ORDER — PREGABALIN 100 MG PO CAPS
200.0000 mg | ORAL_CAPSULE | Freq: Three times a day (TID) | ORAL | Status: DC
  Administered 2024-04-06 – 2024-04-18 (×37): 200 mg via ORAL
  Filled 2024-04-06 (×38): qty 2

## 2024-04-06 MED ORDER — METOCLOPRAMIDE HCL 5 MG PO TABS
5.0000 mg | ORAL_TABLET | Freq: Three times a day (TID) | ORAL | 1 refills | Status: DC
  Filled 2024-04-06 – 2024-04-07 (×2): qty 90, 30d supply, fill #0
  Filled 2024-04-25 – 2024-05-03 (×3): qty 90, 30d supply, fill #1

## 2024-04-06 MED ORDER — SODIUM CHLORIDE 0.9 % IV SOLN
2.0000 g | Freq: Two times a day (BID) | INTRAVENOUS | Status: DC
  Administered 2024-04-06 (×2): 2 g via INTRAVENOUS
  Filled 2024-04-06 (×2): qty 12.5

## 2024-04-06 MED ORDER — ACETAMINOPHEN 650 MG RE SUPP: 650.0000 mg | Freq: Four times a day (QID) | RECTAL | Status: DC | PRN

## 2024-04-06 NOTE — Inpatient Diabetes Management (Addendum)
 Inpatient Diabetes Program Recommendations  AACE/ADA: New Consensus Statement on Inpatient Glycemic Control (2015)  Target Ranges:  Prepandial:   less than 140 mg/dL      Peak postprandial:   less than 180 mg/dL (1-2 hours)      Critically ill patients:  140 - 180 mg/dL   Lab Results  Component Value Date   GLUCAP 170 (H) 04/06/2024   HGBA1C 9.8 (H) 04/06/2024    Review of Glycemic Control  Latest Reference Range & Units 04/06/24 07:30 04/06/24 08:10 04/06/24 11:56 04/06/24 13:42  Glucose-Capillary 70 - 99 mg/dL 696 (H) 295 (H) 284 (H) 170 (H)  (H): Data is abnormally high  Diabetes history: DM2 Outpatient Diabetes medications: Tresiba 65 units QAM, Ozempic 1 mg Q Monday, Dexcom G7 Current orders for Inpatient glycemic control: Semglee 30 units every day, Novolog 0-15 units TID and 0-5 units QHS  Met with patient at bedside.  Reviewed patient's current A1c of 9.8% (average BG of 235 mg/dL). Explained what a A1c is and what it measures. Also reviewed goal A1c with patient, importance of good glucose control @ home, and blood sugar goals.  Discussed importance of good glucose control for healing.    She admits to drinking regular sodas. Encouraged her to switch to water or non-caloric beverages.  Discussed impact of infection on glucose trends as well.  She is current with her PCP.  She uses a Dexcom G7 but has misplaced her receiver in her room at home.  She has plenty of sensors.  Encouraged her to locate her receiver so she can monitor her glucose closely.  She can contact her PCP if she cannot find it and request a replacement.  She has a back up glucometer at home.    Denies episodes of hypoglycemia.  Reviewed hypoglycemia, < 70 mg/dL, signs, symptoms and treatments. Ordered the [facility] booklet.  Will continue to follow while inpatient.  Thank you, Hays Lipschutz, MSN, CDCES Diabetes Coordinator Inpatient Diabetes Program [phone] (team pager from 8a-5p)

## 2024-04-06 NOTE — Plan of Care (Signed)

## 2024-04-06 NOTE — Progress Notes (Signed)
 Pt from home with none working wound vac, son did dressing change but did not do it properly. Wound smelly and sloughy. Cleaned and vac dressing changed on admission, see pics.

## 2024-04-06 NOTE — Telephone Encounter (Signed)
 Medication sent to pharmacy

## 2024-04-06 NOTE — Consult Note (Signed)
 Reason for Consult: Right foot infection Referring Physician: Dr. Lolita Lenz is an 63 y.o. female.  HPI: Patient under our service from previous transmetatarsal amputation PAD and diabetic foot infection readmitted for worsening right foot wound and sepsis  Past Medical History:  Diagnosis Date   Acute vestibular syndrome, resolved 03/02/2021   Angioedema 06/14/2021   Asthma    Burn of finger of right hand, second degree 02/05/2021   Occurred during cooking (frying), poor sensation due to neuropathy, pt punctured blister to allow it to drain, skin has since desquamated over dorsal joint, no infection.  Keep clean and dry, OTC antibacterial ointment.   Cataract    CHF (congestive heart failure) (HCC)    Chronic bronchitis (HCC)    "I get it alot" (09/28/2013)   Chronic diastolic heart failure (HCC)    grade 2 per 2D echocardiogram (01/2013)   Chronic kidney disease    Chronic lower back pain    Chronic pain syndrome 12/03/2011   Likely secondary to depression, "fibromyalgia", neuropathy, and obesity. Lumbar MRI 2014 no sig change from prior (2008) : Stable hypertrophic facet disease most notable at L4-5. Stable shallow left foraminal/extraforaminal disc protrusion at L4-5. No direct neural compression.       Chronic right shoulder pain 10/10/2021   COPD 01/08/2007   PFT's 05/2007 : FEV1/FVC 82, FEV1 64% pred, FEF 25-75% 40% predicted, 16% improvement in FEV1 with bronchodilators.      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 Right brachial vein, diagnosed on 03/10/2013 Coumadin for 3 months. 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  S. Aureus and GBS.    History of use of hearing aid    Hyperlipidemia    Hyperplastic colon polyp 12/2010   Per colonoscopy (12/2010) - Dr. 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)    Diagnosed age 24; treated initially with "lots of aspirin"   Nausea and vomiting 11/24/2008   Parotid nodules, resected 05/2022, benign 07/11/2021   Incidental finding 03/04/21  "multiple bilateral parotid nodules the largest in the right gland measuring 11 mm", asymptomatic.       Initially evaluated 08/2019 with dedicated MRI: "IMPRESSION:  Skin marker overlies a 9 x 10 x 11 mm cyst within the anterior  aspect of the superficial lobe of the right parotid gland. This is  presumed to be a benign cyst. The possibility of a cystic Warthin's  tumor   Pneumonia    PVD (peripheral vascular disease) with claudication (HCC)    Stents to bilateral common iliac arteries (left 2005, right 2008), on chronic plavix   Pyelonephritis 10/28/2020   S/P BKA (below knee amputation) unilateral (HCC)    2014 L -  failed limb preserving treatment. 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  Evaluated on 07/08/2021 by podiatrist Dr. Logan Bores who debrided necrotic tissue and prescribed doxycycline.  He will see her again in 3 weeks.  The location of this ulcer on the dorsal aspect of the toe is somewhat atypical for a purely diabetic foot wound, and she does have a  strong DP pulse.  I did not examine h   Type II diabetes mellitus with peripheral circulatory disorders, uncontrolled DX: 1993   Insulin dep. Poor control. Complicated by diabetic foot ulcer and diabetic eye disease.     Upper respiratory tract infection due to COVID-19 virus 07/22/2022   Urinary incontinence, severe, mixed (stress, urge, functional) 02/14/2023   Unresponsive to medications    Past Surgical History:  Procedure Laterality Date   ABDOMINAL AORTOGRAM W/LOWER EXTREMITY N/A 03/28/2024   Procedure: ABDOMINAL AORTOGRAM W/LOWER EXTREMITY;  Surgeon: Daria Pastures, MD;  Location: [facility] INVASIVE CV LAB;  Service: Cardiovascular;  Laterality: N/A;   ABDOMINAL HYSTERECTOMY  1997   secondary to uterine fibroids   AMPUTATION Left 08/31/2013   Procedure: AMPUTATION RAY;  Surgeon: Nadara Mustard, MD;  Location: [facility] OR;  Service: Orthopedics;  Laterality: Left;  Left Foot 5th Ray Amputation   AMPUTATION Left 09/28/2013   Procedure: Left Midfoot amputation;  Surgeon: Nadara Mustard, MD;  Location: [facility] OR;  Service: Orthopedics;  Laterality: Left;  Left Midfoot amputation   AMPUTATION Left 10/14/2013   Procedure: AMPUTATION BELOW KNEE- left;  Surgeon: Nadara Mustard, MD;  Location: [facility] OR;  Service: Orthopedics;  Laterality: Left;  Left Below Knee Amputation    AMPUTATION TOE Right 01/15/2017   Procedure: AMPUTATION 5th TOE RIGHT FOOT;  Surgeon: Felecia Shelling, DPM;  Location: [facility] OR;  Service: Podiatry;  Laterality: Right;   AMPUTATION TOE Right 02/12/2023   Procedure: Right Foot Transmetatarsal Amputation;  Surgeon: [name], DPM;  Location: [facility] OR;  Service: Podiatry;  Laterality: Right;   APPLICATION OF WOUND VAC  04/01/2019   Procedure: Application Of Wound Vac;  Surgeon: Nadara Mustard, MD;  Location: [facility] OR;  Service: Orthopedics;;   BLADDER SURGERY     bladder reconstruction surgery   BOTOX INJECTION N/A 08/21/2021   Procedure: CYSTOSCOPY BOTOX INJECTION;  Surgeon: Crista Elliot,  MD;  Location: [facility] ORS;  Service: Urology;  Laterality: N/A;   BREAST BIOPSY     multiple-benign per pt   CATARACT EXTRACTION, BILATERAL     summer 2022   COLONOSCOPY     DILATION AND CURETTAGE OF UTERUS  1985   ESOPHAGOGASTRODUODENOSCOPY N/A 09/20/2013   Procedure: ESOPHAGOGASTRODUODENOSCOPY (EGD);  Surgeon: Beverley Fiedler, MD;  Location: [facility] ENDOSCOPY;  Service: Gastroenterology;  Laterality: N/A;   EYE SURGERY Bilateral 2022   Cataract removal in June and then July   FOOT AMPUTATION THROUGH METATARSAL Left 09/28/2013   GANGLION CYST EXCISION     multiple   LOWER EXTREMITY INTERVENTION Right 03/28/2024   Procedure: LOWER EXTREMITY INTERVENTION;  Surgeon: Daria Pastures, MD;  Location: [facility] INVASIVE CV LAB;  Service: Cardiovascular;  Laterality: Right;   METATARSAL HEAD EXCISION Right 03/29/2024   Procedure: EXCISION, METATARSAL BONE, HEAD;  Surgeon: Pilar Plate, DPM;  Location: [facility] OR;  Service: Orthopedics/Podiatry;  Laterality: Right;  Irrigation and Debridment right foot with partial fifth met resection, antibiotic beads, wound vac   PAROTIDECTOMY Right 05/14/2022   Procedure: PAROTIDECTOMY;  Surgeon: Virgina Grills, MD;  Location: [facility] OR;  Service: ENT;  Laterality: Right;   PERIPHERAL VASCULAR INTERVENTION     stents in lower ext   PREAURICULAR CYST EXCISION N/A 05/14/2022   Procedure: EXCISION OF SCALP SKIN CYST, 1.5cm;  Surgeon: Virgina Grills, MD;  Location: [facility] OR;  Service: ENT;  Laterality: N/A;   SHOULDER ARTHROSCOPY Right 11/11/2019   RIGHT SHOULDER ARTHROSCOPY AND DEBRIDEMENT    SHOULDER ARTHROSCOPY Right 11/11/2019   Procedure: RIGHT SHOULDER ARTHROSCOPY AND DEBRIDEMENT;  Surgeon: Timothy Ford, MD;  Location: [facility] OR;  Service: Orthopedics;  Laterality: Right;   SHOULDER ARTHROSCOPY W/ ROTATOR CUFF REPAIR Bilateral    2 on right one on left   SKIN SPLIT GRAFT Bilateral 05/13/2013   Procedure: Right and Left Foot Allograft Skin Graft;  Surgeon: Timothy Ford, MD;   Location: [facility] OR;  Service: Orthopedics;  Laterality: Bilateral;  Right and Left Foot Allograft Skin Graft   STUMP REVISION Left 04/01/2019   Procedure: REVISION LEFT BELOW KNEE AMPUTATION;  Surgeon: Timothy Ford, MD;  Location: [facility] OR;  Service: Orthopedics;  Laterality: Left;   TEE WITHOUT CARDIOVERSION N/A 01/31/2013   Procedure: TRANSESOPHAGEAL ECHOCARDIOGRAM (TEE);  Surgeon: Elmyra Haggard, MD;  Location: [facility] ENDOSCOPY;  Service: Cardiovascular;  Laterality: N/A;  Rm [id number]   TEE WITHOUT CARDIOVERSION N/A 03/10/2013   Procedure: TRANSESOPHAGEAL ECHOCARDIOGRAM (TEE);  Surgeon: Darlis Eisenmenger, MD;  Location: [facility] ENDOSCOPY;  Service: Cardiovascular;  Laterality: N/A;  Rm. [id number]   TOE AMPUTATION Left 08/31/2013   4TH & 5 TH TOE    TONSILLECTOMY     TUBAL LIGATION     WRIST SURGERY Right    "for tumors" (09/28/2013)    Family History  Problem Relation Age of Onset   Diverticulosis Mother    Diabetes Mother    Hypertension Mother    Congestive Heart Failure Mother    Asthma Father    CAD Sister 32       MI at age 23 per patient.  However, she has not had a stent or CABG.    Heart disease Sister        before age 20   Colon cancer Brother    Breast cancer Neg Hx    Colon polyps Neg Hx    Rectal cancer Neg Hx    Stomach cancer Neg Hx    Esophageal cancer Neg Hx     Social History:  reports that she has been smoking cigarettes. She started smoking about 53 years ago. She has a 26.6 pack-year smoking history. She has never used smokeless tobacco. She reports that she does not drink alcohol and does not use drugs.  Allergies:  Allergies  Allergen Reactions   Benazepril Swelling    Possible tongue swelling 06/14/2021   Chantix [Varenicline] Other (See Comments)    Unknown reaction   Ioversol    Morphine Sulfate    Abilify [Aripiprazole] Other (See Comments)    Urinary freq Nov 2016   Iohexol Other (See Comments)    IV CONTRAST CAUSED TRANSIENT NEPHROPATHY (KIDNEY INSUFFICIENCY) IN  2007    Ivp Dye [Iodinated Contrast Media] Other (See Comments)    IV CONTRAST CAUSED TRANSIENT NEPHROPATHY (KIDNEY INSUFFICIENCY) IN 2007    Medications: I have reviewed the patient's current medications.  Results for orders placed or performed during the hospital encounter of 04/05/24 (from the past 48 hours)  Comprehensive metabolic panel     Status: Abnormal   Collection Time: 04/05/24 10:03 PM  Result Value Ref Range   Sodium 131 (L) 135 - 145 mmol/L   Potassium 4.5 3.5 - 5.1 mmol/L   Chloride 96 (L) 98 - 111 mmol/L   CO2 24 22 - 32 mmol/L   Glucose, Bld 408 (H) 70 - 99 mg/dL    Comment: Glucose reference range applies only to samples taken after fasting for at least 8 hours.   BUN 11 8 - 23 mg/dL   Creatinine, Ser 4.09 (H) 0.44 - 1.00 mg/dL   Calcium 8.9 8.9 - 81.1 mg/dL   Total Protein 6.5 6.5 - 8.1 g/dL   Albumin 2.4 (L) 3.5 - 5.0 g/dL   AST 22 15 - 41 U/L   ALT 15 0 - 44 U/L   Alkaline Phosphatase 76 38 - 126 U/L   Total Bilirubin 0.7 0.0 - 1.2 mg/dL   GFR, Estimated 54 (L) >60 mL/min    Comment: (NOTE) Calculated using the CKD-EPI Creatinine Equation (2021)    Anion gap 11 5 - 15    Comment: Performed at [facility] Lab, [address] [place], [place] [address]  CBC with Differential     Status: Abnormal   Collection Time: 04/05/24 10:03 PM  Result Value Ref Range   WBC 16.0 (H) 4.0 - 10.5 K/uL   RBC 3.66 (L) 3.87 - 5.11 MIL/uL   Hemoglobin 11.2 (L) 12.0 - 15.0 g/dL   HCT 29.5 (L) 62.1 - 30.8 %   MCV 94.3 80.0 - 100.0 fL   MCH 30.6 26.0 - 34.0 pg   MCHC 32.5 30.0 - 36.0 g/dL   RDW 65.7 84.6 - 96.2 %   Platelets 268 150 - 400 K/uL   nRBC 0.0 0.0 - 0.2 %   Neutrophils Relative % 77 %   Neutro Abs 12.3 (H) 1.7 - 7.7 K/uL   Lymphocytes Relative 16 %   Lymphs Abs 2.6 0.7 - 4.0 K/uL   Monocytes Relative 6 %   Monocytes Absolute 0.9 0.1 - 1.0 K/uL   Eosinophils Relative 0 %   Eosinophils Absolute 0.1 0.0 - 0.5 K/uL   Basophils Relative 0 %   Basophils  Absolute 0.1 0.0 - 0.1 K/uL   Immature Granulocytes 1 %   Abs Immature Granulocytes 0.09 (H) 0.00 - 0.07 K/uL    Comment: Performed at [facility] Lab, [address] [place], [place] [address]  Protime-INR     Status: Abnormal   Collection Time: 04/05/24 10:03 PM  Result Value Ref Range   Prothrombin Time 16.7 (H) 11.4 - 15.2 seconds   INR 1.3 (H) 0.8 - 1.2    Comment: (NOTE) INR goal varies based on device and disease states. Performed at [facility] Lab, [address], [place], [place] [address]   Blood Culture (routine x 2)     Status: None (Preliminary result)   Collection Time: 04/05/24 10:03 PM   Specimen: BLOOD LEFT ARM  Result Value Ref Range   Specimen Description BLOOD LEFT ARM    Special Requests      BOTTLES DRAWN AEROBIC AND ANAEROBIC Blood Culture results may not be optimal due to an inadequate volume of blood received in culture bottles   Culture      NO GROWTH < 12 HOURS Performed at [facility]  Lab, [address], [place], [place] [address]    Report Status PENDING   Blood Culture (routine x 2)     Status: None (Preliminary result)   Collection Time: 04/05/24 10:04 PM   Specimen: BLOOD RIGHT ARM  Result Value Ref Range   Specimen Description BLOOD RIGHT ARM    Special Requests      BOTTLES DRAWN AEROBIC AND ANAEROBIC Blood Culture results may not be optimal due to an inadequate volume of blood received in culture bottles   Culture      NO GROWTH < 12 HOURS Performed at [facility] Lab, [address] [place], [place] [address]    Report Status PENDING   Urinalysis, w/ Reflex to Culture (Infection Suspected) -     Status: Abnormal   Collection Time: 04/05/24 11:00 PM  Result Value Ref Range   Specimen Source URINE, RANDOM     Comment: CORRECTED ON 04/15 AT 2341: PREVIOUSLY REPORTED AS UR   Color, Urine YELLOW YELLOW   APPearance CLOUDY (A) CLEAR   Specific Gravity, Urine 1.021 1.005 - 1.030   pH 6.0 5.0 - 8.0   Glucose, UA >=500 (A)  NEGATIVE mg/dL   Hgb urine dipstick SMALL (A) NEGATIVE   Bilirubin Urine NEGATIVE NEGATIVE   Ketones, ur NEGATIVE NEGATIVE mg/dL   Protein, ur NEGATIVE NEGATIVE mg/dL   Nitrite NEGATIVE NEGATIVE   Leukocytes,Ua NEGATIVE NEGATIVE   RBC / HPF 11-20 0 - 5 RBC/hpf   WBC, UA 0-5 0 - 5 WBC/hpf    Comment:        Reflex urine culture not performed if WBC <=10, OR if Squamous epithelial cells >5. If Squamous epithelial cells >5 suggest recollection.    Bacteria, UA RARE (A) NONE SEEN   Squamous Epithelial / HPF 6-10 0 - 5 /HPF   Budding Yeast PRESENT     Comment: Performed at [facility] Lab, [address], [place], [place] [address]  I-Stat Lactic Acid, ED     Status: None   Collection Time: 04/05/24 11:04 PM  Result Value Ref Range   Lactic Acid, Venous 1.2 0.5 - 1.9 mmol/L  Hemoglobin A1c     Status: Abnormal   Collection Time: 04/06/24  6:12 AM  Result Value Ref Range   Hgb A1c MFr Bld 9.8 (H) 4.8 - 5.6 %    Comment: (NOTE) Pre diabetes:          5.7%-6.4%  Diabetes:              >6.4%  Glycemic control for   <7.0% adults with diabetes    Mean Plasma Glucose 234.56 mg/dL    Comment: Performed at [facility] Lab, [address] [place], [place] [address]  CBC     Status: Abnormal   Collection Time: 04/06/24  7:23 AM  Result Value Ref Range   WBC 11.7 (H) 4.0 - 10.5 K/uL   RBC 3.42 (L) 3.87 - 5.11 MIL/uL   Hemoglobin 10.7 (L) 12.0 - 15.0 g/dL   HCT 65.7 (L) 84.6 - 96.2 %   MCV 90.9 80.0 - 100.0 fL   MCH 31.3 26.0 - 34.0 pg   MCHC 34.4 30.0 - 36.0 g/dL   RDW 95.2 84.1 - 32.4 %   Platelets 256 150 - 400 K/uL   nRBC 0.0 0.0 - 0.2 %    Comment: Performed at [facility] Lab, [address] [place], [place] [address]  CBG monitoring, ED  Status: Abnormal   Collection Time: 04/06/24  7:30 AM  Result Value Ref Range   Glucose-Capillary 314 (H) 70 - 99 mg/dL    Comment: Glucose reference range applies only to samples taken after fasting for at least 8 hours.   CBG monitoring, ED     Status: Abnormal   Collection Time: 04/06/24  8:10 AM  Result Value Ref Range   Glucose-Capillary 265 (H) 70 - 99 mg/dL    Comment: Glucose reference range applies only to samples taken after fasting for at least 8 hours.  CBG monitoring, ED     Status: Abnormal   Collection Time: 04/06/24 11:56 AM  Result Value Ref Range   Glucose-Capillary 192 (H) 70 - 99 mg/dL    Comment: Glucose reference range applies only to samples taken after fasting for at least 8 hours.  Glucose, capillary     Status: Abnormal   Collection Time: 04/06/24  1:42 PM  Result Value Ref Range   Glucose-Capillary 170 (H) 70 - 99 mg/dL    Comment: Glucose reference range applies only to samples taken after fasting for at least 8 hours.   *Note: Due to a large number of results and/or encounters for the requested time period, some results have not been displayed. A complete set of results can be found in Results Review.    MR FOOT RIGHT W WO CONTRAST Result Date: 04/06/2024 CLINICAL DATA:  Foot osteomyelitis EXAM: MRI OF THE RIGHT FOREFOOT WITHOUT AND WITH CONTRAST TECHNIQUE: Multiplanar, multisequence MR imaging of the right forefoot was performed before and after the administration of intravenous contrast. CONTRAST:  10mL GADAVIST GADOBUTROL 1 MMOL/ML IV SOLN COMPARISON:  Radiographs 04/05/2024 FINDINGS: Bones/Joint/Cartilage Complete amputation of the fifth ray. Transmetatarsal amputation of the remaining digits. Large lateral soft tissue defect extends to the lateral margin of the articulation between the cuboid and the remaining fourth metatarsal as shown for example on image 11 of series 12, including abutment of of atmospheric gas from the wound with the periosteal margin of the base of the fourth metatarsal. Low-grade edema and enhancement primarily along the endosteum of the fourth metatarsal and distal cuboid suspicious for osteomyelitis given the proximity to the large wound. Nonspecific  low-grade marrow enhancement distally in the lateral cuneiform and proximally in the third metatarsal base. Ligaments Lisfranc ligament intact Muscles and Tendons Regional muscular edema likely a combination of neurogenic edema and atrophy related to distal amputation. Soft tissues As noted above there is a large lateral wound along the foot extending to the periosteal margin of the base of the fourth metatarsal and the margin of the articulation of the fourth metatarsal with the cuboid. Substantial cutaneous and subcutaneous edema within along the margins of this wound tracking laterally along the remaining foot. There is also subcutaneous edema and possible wound along the distal margin of the amputation site medially with associated enhancement in the subcutaneous tissues anteromedially as on image 7 series 12, suspicious for a 2nd wound. Fatty tissues plantar to the 1st metatarsal likely related to original amputation flap, considered incidental. IMPRESSION: 1. Large lateral wound along the foot extending to the periosteal margin of the base of the fourth metatarsal and the margin of the articulation between the fourth metatarsal and the cuboid. Low-grade edema and enhancement primarily along the endosteum of the fourth metatarsal and distal cuboid suspicious for osteomyelitis given the proximity to the large wound. 2. Substantial cutaneous and subcutaneous edema along the margins of this wound tracking laterally along the remaining  foot. 3. There is also subcutaneous edema and possible wound along the distal margin of the amputation site medially with associated enhancement in the subcutaneous tissues anteromedially, suspicious for a 2nd wound. 4. Regional muscular edema likely a combination of neurogenic edema and atrophy related to distal amputation. Electronically Signed   By: Freida Jes M.D.   On: 04/06/2024 09:26   DG Chest Port 1 View Result Date: 04/05/2024 CLINICAL DATA:  Possible sepsis  EXAM: PORTABLE CHEST 1 VIEW COMPARISON:  03/15/2024 FINDINGS: The heart size and mediastinal contours are within normal limits. Both lungs are clear. The visualized skeletal structures are unremarkable. IMPRESSION: No active disease. Electronically Signed   By: Violeta Grey M.D.   On: 04/05/2024 21:55   DG Foot 2 Views Right Result Date: 04/05/2024 CLINICAL DATA:  Right foot wound, initial encounter EXAM: RIGHT FOOT - 2 VIEW COMPARISON:  03/29/2024 FINDINGS: Wound VAC is again present laterally. Multiple antibiotic beads are again seen but decreased in number. Large soft tissue defect is noted new from the prior exam fifth metatarsal has been surgically removed. 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 No definitive erosive changes are seen. IMPRESSION: Enlarging lateral soft tissue wound with wound VAC in place. Electronically Signed   By: Violeta Grey M.D.   On: 04/05/2024 21:54    Review of Systems  Constitutional:  Positive for malaise/fatigue. Negative for chills and fever.  Respiratory:  Negative for shortness of breath.   Cardiovascular:  Negative for chest pain.  All other systems reviewed and are negative.  Blood pressure 116/88, pulse 73, temperature 97.8 F (36.6 C), temperature source Oral, resp. rate 18, height 5\' 5"  (1.651 m), weight 99.8 kg, SpO2 97%.  Vitals:   04/06/24 1200 04/06/24 1257  BP: (!) 126/48 116/88  Pulse: 68 73  Resp: 12 18  Temp:  97.8 F (36.6 C)  SpO2: 100% 97%    General AA&O x3. Normal mood and affect.  Vascular Right foot nonpalpable pulses but foot is relatively warm at the midfoot and cool at the distal apex  Neurologic Epicritic sensation grossly absent.  Dermatologic (Wound) Full-thickness ulcer lateral foot with exposed necrotic tissue and bone.  Distal epidermal lysis with seropurulent drainage  Orthopedic: Motor intact BLE.    Assessment/Plan:  PAD diabetic foot infection -Imaging: Studies independently  reviewed.  Infection has spread and has now infected the fourth metatarsal and cuboid. -Antibiotics:  broad spectrum -WB Status: Nonweightbearing -Wound Care: Discontinue the wound VAC in the setting of infection. -Surgical Plan: Case discussed with Dr. Rosemarie Conquest.  Considering her significant PAD spread of the osteomyelitis to the hindfoot and uncontrolled diabetes, her chances and options of salvaging her foot at this point are grim.  Likely will need proximal amputation.  Further recommendations pending his evaluation  Floyce Hutching 04/06/2024, 5:00 PM   Best available via secure chat for questions or concerns.

## 2024-04-06 NOTE — Consult Note (Signed)
 WOC consult requested for right foot.  Pt has been followed by the podiatry team prior to admission and progress notes indicate a consult is pending from them for a wound assessment. Bedside nurse changed the Vac dressing after patient was admitted since it was not functioning.   Please refer to podiatry team for further plan of care.  Please re-consult if further assistance is needed.  Thank-you,  Wiliam Harder MSN, RN, CWOCN, [place], CNS [phone]

## 2024-04-06 NOTE — Progress Notes (Signed)
                  Subjective:   Summary: 63 yo F with PMH of insulin dependent T2DM, COPD, CKD 2, L BKA in 2014, R TMA in 2024, resection of R fifth metatarsal on 4/8 due to osteomyelitis, readmitted for sepsis 2/2 post-op wound infection.   Patient is understandably emotional about her present situation. She is extremely concerned about losing her foot. She is still having significant pain in her foot and feels nauseated. Informed her of the plan to continue antibiotics and for podiatry to evaluate her today.   Objective:  Vital signs in last 24 hours: Vitals:   04/06/24 0200 04/06/24 0300 04/06/24 0400 04/06/24 0500  BP: 126/66 (!) 123/59 (!) 136/46 (!) 138/53  Pulse: 98 (!) 112 83 81  Resp: 15 14 19  (!) 26  Temp:      TempSrc:      SpO2: 95% 96% 98% 90%  Weight:      Height:       Supplemental O2: Room Air SpO2: 90 %  Physical Exam:  Constitutional: nontoxic, uncomfortable appearing Pulmonary/Chest: normal work of breathing, no respiratory distress Neuro: A&O x 3 Psych: tearful Extremities: Post surgical changes visible through clear wound wrapping. Wound vac in place  Assessment/Plan:   Post-op R foot wound infection R fifth metatarsal osteomyelitis s/p resection 4/8 Sepsis, improving Patient presenting with worsening pain, fever, malaise after recent admission for R foot osteomyelitis requiring resection by podiatry. After discharge she developed a fever with worsening foot pain and malaise. She was not able to tolerate the full three days of Augmentin prescribed at discharge. Additionally, nobody came to change her wound vac so her family attempted to change it with limited experience. MRI showing findings suspicious for osteomyelitis, as well as a possible second wound along the distal margin of the amputation site.  - Podiatry consulted, appreciate assistance - Continue broad spectrum antibiotics with cefepime, vancomycin, metronidazole - Blood cultures with no growth  less than 12 hours - Scheduled Tylenol 1000mg  q8h, oxycodone 10mg  q4h prn, pregabalin 200mg  TID - WOC consulted  T2DM Home regimen includes 65 units Degludec, metformin 500mg  daily, semaglutide 1mg .  - Semglee 30 units daily, SSI TID and qhs  HTN Blood pressure was low/normal on admission. She had not taken her morning medications. I will continue holding home antihypertensives at this time while BP is at goal.   HLD, PAD: continue home rosuvastatin 20mg  Chronic back pain: continue home pregabalin 200mg  TID COPD: continue home Anoro Ellipta Depression: continue home duloxetine 60mg  GERD: continue home pantoprazole 40mg  CKD2: Stable, monitor with morning labs Urinary incontinence: continue home Myrbetriq  Diet: Carb modified VTE: Heparin Code: Full  Dispo: Anticipated discharge to home pending medical stability.   Jayson Michael, MD PGY-1 Internal Medicine Resident Pager Number [phone] Please contact the on call pager after 5 pm and on weekends at [phone].

## 2024-04-06 NOTE — H&P (Addendum)
 Date: 04/06/2024               Patient Name:  Jennifer [name] MRN: [id number]  DOB: 11-16-1961 Age / Sex: 63 y.o., female   PCP: Miguel Aschoff, MD         Medical Service: Internal Medicine Teaching Service         Attending Physician: Dr. Mayford Knife Dorene Ar, MD      First Contact: Dr. Monna Fam, MD Pager [phone]    Second Contact: Dr. Rocky Morel, DO          After Hours (After 5p/  First Contact Pager: [phone]  weekends / holidays): Second Contact Pager: [phone]   SUBJECTIVE   Chief Complaint: Wound complication  History of Present Illness:  This is a 63 year old lady with pertinent past medical history of hypertension, poorly controlled type 2 diabetes with peripheral neuropathy, status post left BKA, hyperlipidemia, COPD, asthma, spinal stenosis, and urinary incontinence who presented to the ED with fevers, chills, and wound concerns.  The patient was recently discharged on 03/31/2024 following fifth metatarsal resection in the setting of osteomyelitis.  She was discharged with 3-day course of Augmentin, the patient states she took all but 2 pills as she said she was feeling unwell.  Yesterday she noticed she began having fevers, chills, and malaise.  She also had some nausea and had an episode of brown emesis.  Her son attempted to change her wound VAC out yesterday which led to the wound VAC alarming.  This is why she came to the emergency department.  In the ED she was found to have signs and symptoms of sepsis, I MTS was paged for readmission.  Patient otherwise denies any new symptoms.  She denies any significant new chest pain, shortness of breath.  ED Course: Vitals: Febrile to 100.8, tachycardic to 107, slightly tachypneic at times.  Blood pressure within normal limits. Lab work demonstrating: Sodium 131, chloride 96.  Glucose increased to 408, creatinine 1.13, baseline 0.8-0.9.  Albumin 2.4 and stable.  CBC demonstrating white blood cell count 16,  hemoglobin 11.2, stable from prior.  PT/INR slightly elevated.  Blood cultures collected and pending.  UA with 11-20 RBCs, no signs of infection.  Lactic within normal limits. X-rays of the foot obtained demonstrated enlarging soft tissue wound when compared to prior.  Chest x-ray with no active disease. In ED, patient was started on cefepime, bank, Flagyl and given 1 L of LR.  Meds:  Current Meds  Medication Sig   Accu-Chek Softclix Lancets lancets Use as back up to CGM sensors up to 1 time a day   acetaminophen (TYLENOL) 650 MG CR tablet Take 2 tablets (1,300 mg total) by mouth every 8 (eight) hours as needed for pain. (Patient taking differently: Take 1,300 mg by mouth daily as needed for pain.)   albuterol (PROAIR HFA) 108 (90 Base) MCG/ACT inhaler INHALE 2 PUFFS BY MOUTH EVERY 6 HOURS AS NEEDED FOR WHEEZING   amLODipine (NORVASC) 10 MG tablet Take 1 tablet (10 mg total) by mouth daily.   Baclofen 5 MG TABS Take 1 tablet (5 mg total) by mouth 2 (two) times daily as needed (muscle spasms).   Continuous Glucose Sensor (DEXCOM G7 SENSOR) MISC Place new sensor every 10 days. Use to monitor blood sugar continuously.   DULoxetine (CYMBALTA) 60 MG capsule Take 1 capsule (60 mg total) by mouth daily.   glucose blood (ACCU-CHEK GUIDE TEST) test strip Use as back up to CGM sensors up  to 1 time a day   insulin degludec (TRESIBA FLEXTOUCH) 200 UNIT/ML FlexTouch Pen Inject 65 Units into the skin daily.   Insulin Pen Needle 32G X 4 MM MISC Use to inject insulin 4 (four) times daily.   lidocaine (LIDODERM) 5 % Place 1 patch onto the skin daily. Remove & Discard patch within 12 hours or as directed by MD   metFORMIN (GLUCOPHAGE-XR) 500 MG 24 hr tablet Take 1 tablet (500 mg total) by mouth daily with breakfast.   methocarbamol (ROBAXIN) 500 MG tablet Take 1 tablet (500 mg total) by mouth 2 (two) times daily. (Patient taking differently: Take 500 mg by mouth 3 (three) times daily as needed for muscle spasms.)    metoCLOPramide (REGLAN) 5 MG tablet Take 1 tablet (5 mg total) by mouth 3 (three) times daily before meals.   metoprolol succinate (TOPROL-XL) 100 MG 24 hr tablet Take 1 tablet (100 mg total) by mouth daily. TAKE WITH OR IMMEDIATELY FOLLOWING A MEAL.   MYRBETRIQ 50 MG TB24 tablet Take 1 tablet (50 mg total) by mouth daily.   ondansetron (ZOFRAN-ODT) 4 MG disintegrating tablet Take 1 tablet (4 mg total) by mouth every 8 (eight) hours as needed for nausea or vomiting. (Patient taking differently: Take 4 mg by mouth daily as needed for nausea or vomiting.)   oxyCODONE (OXY IR/ROXICODONE) 5 MG immediate release tablet Take 1 tablet (5 mg total) by mouth every 4 (four) hours as needed for up to 5 days.   pantoprazole (PROTONIX) 40 MG tablet Take 1 tablet (40 mg total) by mouth daily.   pregabalin (LYRICA) 200 MG capsule Take 1 capsule (200 mg total) by mouth 3 (three) times daily.   rosuvastatin (CRESTOR) 20 MG tablet Take 1 tablet (20 mg total) by mouth in the morning.   Semaglutide, 1 MG/DOSE, (OZEMPIC, 1 MG/DOSE,) 4 MG/3ML SOPN Inject 1 mg into the skin once a week.   silver sulfADIAZINE (SILVADENE) 1 % cream Apply 1 Application topically daily. To the wounds on your leg and foot   spironolactone (ALDACTONE) 50 MG tablet Take 1 tablet (50 mg total) by mouth daily. (Patient taking differently: Take 50 mg by mouth 2 (two) times daily.)   traZODone (DESYREL) 100 MG tablet Take 200 mg by mouth at bedtime as needed for sleep.   umeclidinium-vilanterol (ANORO ELLIPTA) 62.5-25 MCG/ACT AEPB Inhale 1 puff into the lungs in the morning.    Past Medical History  Past Surgical History:  Procedure Laterality Date   ABDOMINAL AORTOGRAM W/LOWER EXTREMITY N/A 03/28/2024   Procedure: ABDOMINAL AORTOGRAM W/LOWER EXTREMITY;  Surgeon: Daria Pastures, MD;  Location: [facility] INVASIVE CV LAB;  Service: Cardiovascular;  Laterality: N/A;   ABDOMINAL HYSTERECTOMY  1997   secondary to uterine fibroids   AMPUTATION Left  08/31/2013   Procedure: AMPUTATION RAY;  Surgeon: Nadara Mustard, MD;  Location: [facility] OR;  Service: Orthopedics;  Laterality: Left;  Left Foot 5th Ray Amputation   AMPUTATION Left 09/28/2013   Procedure: Left Midfoot amputation;  Surgeon: Nadara Mustard, MD;  Location: [facility] OR;  Service: Orthopedics;  Laterality: Left;  Left Midfoot amputation   AMPUTATION Left 10/14/2013   Procedure: AMPUTATION BELOW KNEE- left;  Surgeon: Nadara Mustard, MD;  Location: [facility] OR;  Service: Orthopedics;  Laterality: Left;  Left Below Knee Amputation    AMPUTATION TOE Right 01/15/2017   Procedure: AMPUTATION 5th TOE RIGHT FOOT;  Surgeon: Felecia Shelling, DPM;  Location: [facility] OR;  Service: Podiatry;  Laterality: Right;  AMPUTATION TOE Right 02/12/2023   Procedure: Right Foot Transmetatarsal Amputation;  Surgeon: Charity Conch, DPM;  Location: [facility] OR;  Service: Podiatry;  Laterality: Right;   APPLICATION OF WOUND VAC  04/01/2019   Procedure: Application Of Wound Vac;  Surgeon: Timothy Ford, MD;  Location: [facility] OR;  Service: Orthopedics;;   BLADDER SURGERY     bladder reconstruction surgery   BOTOX INJECTION N/A 08/21/2021   Procedure: CYSTOSCOPY BOTOX INJECTION;  Surgeon: Samson Croak, MD;  Location: [facility] ORS;  Service: Urology;  Laterality: N/A;   BREAST BIOPSY     multiple-benign per pt   CATARACT EXTRACTION, BILATERAL     summer 2022   COLONOSCOPY     DILATION AND CURETTAGE OF UTERUS  1985   ESOPHAGOGASTRODUODENOSCOPY N/A 09/20/2013   Procedure: ESOPHAGOGASTRODUODENOSCOPY (EGD);  Surgeon: Nannette Babe, MD;  Location: [facility] ENDOSCOPY;  Service: Gastroenterology;  Laterality: N/A;   EYE SURGERY Bilateral 2022   Cataract removal in June and then July   FOOT AMPUTATION THROUGH METATARSAL Left 09/28/2013   GANGLION CYST EXCISION     multiple   LOWER EXTREMITY INTERVENTION Right 03/28/2024   Procedure: LOWER EXTREMITY INTERVENTION;  Surgeon: Philipp Brawn, MD;  Location: [facility] INVASIVE CV LAB;  Service: Cardiovascular;   Laterality: Right;   METATARSAL HEAD EXCISION Right 03/29/2024   Procedure: EXCISION, METATARSAL BONE, HEAD;  Surgeon: Evertt Hoe, DPM;  Location: [facility] OR;  Service: Orthopedics/Podiatry;  Laterality: Right;  Irrigation and Debridment right foot with partial fifth met resection, antibiotic beads, wound vac   PAROTIDECTOMY Right 05/14/2022   Procedure: PAROTIDECTOMY;  Surgeon: Virgina Grills, MD;  Location: [facility] OR;  Service: ENT;  Laterality: Right;   PERIPHERAL VASCULAR INTERVENTION     stents in lower ext   PREAURICULAR CYST EXCISION N/A 05/14/2022   Procedure: EXCISION OF SCALP SKIN CYST, 1.5cm;  Surgeon: Virgina Grills, MD;  Location: [facility] OR;  Service: ENT;  Laterality: N/A;   SHOULDER ARTHROSCOPY Right 11/11/2019   RIGHT SHOULDER ARTHROSCOPY AND DEBRIDEMENT    SHOULDER ARTHROSCOPY Right 11/11/2019   Procedure: RIGHT SHOULDER ARTHROSCOPY AND DEBRIDEMENT;  Surgeon: Timothy Ford, MD;  Location: [facility] OR;  Service: Orthopedics;  Laterality: Right;   SHOULDER ARTHROSCOPY W/ ROTATOR CUFF REPAIR Bilateral    2 on right one on left   SKIN SPLIT GRAFT Bilateral 05/13/2013   Procedure: Right and Left Foot Allograft Skin Graft;  Surgeon: Timothy Ford, MD;  Location: [facility] OR;  Service: Orthopedics;  Laterality: Bilateral;  Right and Left Foot Allograft Skin Graft   STUMP REVISION Left 04/01/2019   Procedure: REVISION LEFT BELOW KNEE AMPUTATION;  Surgeon: Timothy Ford, MD;  Location: [facility] OR;  Service: Orthopedics;  Laterality: Left;   TEE WITHOUT CARDIOVERSION N/A 01/31/2013   Procedure: TRANSESOPHAGEAL ECHOCARDIOGRAM (TEE);  Surgeon: Elmyra Haggard, MD;  Location: [facility] ENDOSCOPY;  Service: Cardiovascular;  Laterality: N/A;  Rm 3W25   TEE WITHOUT CARDIOVERSION N/A 03/10/2013   Procedure: TRANSESOPHAGEAL ECHOCARDIOGRAM (TEE);  Surgeon: Darlis Eisenmenger, MD;  Location: [facility] ENDOSCOPY;  Service: Cardiovascular;  Laterality: N/A;  Rm. [id number]   TOE AMPUTATION Left 08/31/2013   4TH & 5 TH TOE    TONSILLECTOMY      TUBAL LIGATION     WRIST SURGERY Right    "for tumors" (09/28/2013)    Social:  Lives With: Son and family at home. Occupation: Disabled Support: Family Level of Function: Independent with ADLs, needs assistance with IDLs. PCP: Dr. Barkley Boot, MD  Substances: Ongoing tobacco use, 50-pack-year history.  Denies alcohol or drug use.  Allergies: Allergies as of 04/05/2024 - Review Complete 04/05/2024  Allergen Reaction Noted   Benazepril Swelling 06/14/2021   Chantix [varenicline] Other (See Comments) 05/05/2022   Ioversol  02/10/2023   Morphine sulfate  02/10/2023   Abilify [aripiprazole] Other (See Comments) 11/09/2015   Iohexol Other (See Comments) 08/17/2007   Ivp dye [iodinated contrast media] Other (See Comments) 05/10/2013    Review of Systems: Please see HPI and A&P for pertinent positives and negatives.   OBJECTIVE:   Physical Exam: Blood pressure 139/63, pulse 83, temperature 99.2 F (37.3 C), temperature source Oral, resp. rate (!) 24, height 5\' 5"  (1.651 m), weight 99.8 kg, SpO2 91%.  Constitutional: No acute distress Cardiovascular: Tachycardic rate rate and regular rhythm, no m/r/g Pulmonary/Chest: normal work of breathing on room air, lungs clear to auscultation bilaterally.  No significant wheezing auscultated. Abdominal: soft, non-tender, non-distended Extremities: Right lower extremity with wound VAC in place, some purulence appreciated.  1+ edema of the right lower extremity to mid shin.  Persistent right lower extremity excoriations with interval healing.  Decreased sensation of the right lower extremity.  No sensation below the ankle. Psych: Pleasant affect  Labs: CBC    Component Value Date/Time   WBC 11.7 (H) 04/06/2024 0723   RBC 3.42 (L) 04/06/2024 0723   HGB 10.7 (L) 04/06/2024 0723   HGB 12.2 09/01/2022 1000   HCT 31.1 (L) 04/06/2024 0723   HCT 38.5 09/01/2022 1000   PLT 256 04/06/2024 0723   PLT 175 09/01/2022 1000   MCV 90.9 04/06/2024  0723   MCV 93 09/01/2022 1000   MCH 31.3 04/06/2024 0723   MCHC 34.4 04/06/2024 0723   RDW 14.5 04/06/2024 0723   RDW 14.4 09/01/2022 1000   LYMPHSABS 2.6 04/05/2024 2203   LYMPHSABS 2.7 09/01/2022 1000   MONOABS 0.9 04/05/2024 2203   EOSABS 0.1 04/05/2024 2203   EOSABS 0.1 09/01/2022 1000   BASOSABS 0.1 04/05/2024 2203   BASOSABS 0.1 09/01/2022 1000     CMP     Component Value Date/Time   NA 131 (L) 04/05/2024 2203   NA 141 02/18/2024 0932   K 4.5 04/05/2024 2203   CL 96 (L) 04/05/2024 2203   CO2 24 04/05/2024 2203   GLUCOSE 408 (H) 04/05/2024 2203   BUN 11 04/05/2024 2203   BUN 10 02/18/2024 0932   CREATININE 1.13 (H) 04/05/2024 2203   CREATININE 0.68 01/31/2015 1641   CALCIUM 8.9 04/05/2024 2203   PROT 6.5 04/05/2024 2203   PROT 6.8 05/26/2023 1017   ALBUMIN 2.4 (L) 04/05/2024 2203   ALBUMIN 3.8 (L) 05/26/2023 1017   AST 22 04/05/2024 2203   ALT 15 04/05/2024 2203   ALKPHOS 76 04/05/2024 2203   BILITOT 0.7 04/05/2024 2203   BILITOT <0.2 05/26/2023 1017   GFRNONAA 54 (L) 04/05/2024 2203   GFRNONAA >89 01/31/2015 1641   GFRAA 54 (L) 10/25/2020 1131   GFRAA >89 01/31/2015 1641   EKG: personally reviewed my interpretation is normal sinus rhythm, similar to prior.  ASSESSMENT & PLAN:   Assessment & Plan by Problem: Principal Problem:   Right foot infection Active Problems:   Type 2 diabetes mellitus with foot ulcer, with long-term current use of insulin (HCC)   Peripheral arterial disease with history of revascularization (HCC)   Opioid dependence, uncomplicated (HCC)   Class 2 severe obesity with serious comorbidity and body mass index (BMI) of 35.0 to 35.9 in  adult [facility])   Urinary incontinence, mixed, urge/stress/functional   History of transmetatarsal amputation of right foot (HCC)   History of amputation of right fifth toe (HCC)   Right foot wound infection Right LE wounds Sepsis Leukocytosis History of multiple foot procedures.  Right transmetatarsal  amputation in February 2024, with repeat infection culminating in recent admission.  At last admission, she underwent angiography and revascularization on 03/28/2024 with vascular surgery and subsequently underwent I&D and fifth metatarsal resection on 03/29/2024.  Cultures at that time demonstrated polymicrobial infection.  She was discharged with 3 days of Augmentin, of which she completed 2 days.  She presented to us  following difficulty with her wound and was found to be febrile and met criteria for sepsis. Plan: - Repeat right foot MRI with and without contrast to assess for new focal collection, continued bony infection. - Podiatry consultation in a.m. - On broad-spectrum antibiotics per ED, will continue for now given patient failed outpatient antibiotics. -trend temp curve and CBC -f/u blood cx collected in ED -tylenol PRN, resume oxycodone as needed.  -consult WOC   T2DM with hyperglycemia, uncontrolled A1c 9.2 in February. PTA on Ozempic, Tresiba 65 units and metformin.  States she has been compliant with these medications.  Glucose on admission greater than 400 without signs of DKA. -CBG monitoring -SSI insulin -Restart home long acting insulin at reduced dose  -Hold home Ozempic and metformin -HbA1c   HTN Normotensive on arrival in the 130s over 60s. -Will hold antihypertensives in setting of infection, will resume as needed.   HLD PAD -Continue home ASA and rosuvastatin   Hyponatremia May be secondary to poor p.o. intake in the setting of nausea and vomiting. Plan: - Trend sodium  Elevated creatinine Does not meet criteria for AKI at this time, will continue to monitor.   Chronic back pain: continue home Lyrica and oxycodone 5 mg Q4H PRN COPD/Tobacco use: continue home Anoro inhaler Depression: continue home duloxetine  GERD: continue home PPI Hx of CKD2: Renal function stable. Continue to monitor.  Urinary incontinence: no acute change, continue home Myrbetriq    Diet: Carb-Modified VTE: Heparin IVF: None,None Code: Full  Prior to Admission Living Arrangement:  Home Anticipated Discharge Location:  SNF versus home Barriers to Discharge: Clinical improvement  Dispo: Admit patient to Observation with expected length of stay less than 2 midnights.  Signed: Sherol Dixie, MD Internal Medicine Resident PGY-1  04/06/2024, 11:19 AM

## 2024-04-06 NOTE — Plan of Care (Signed)
  Problem: Education: Goal: Knowledge of General Education information will improve Description: Including pain rating scale, medication(s)/side effects and non-pharmacologic comfort measures 04/06/2024 1929 by Asencion Lawless, RN Outcome: Progressing 04/06/2024 1928 by Asencion Lawless, RN Outcome: Progressing   Problem: Health Behavior/Discharge Planning: Goal: Ability to manage health-related needs will improve 04/06/2024 1929 by Asencion Lawless, RN Outcome: Progressing 04/06/2024 1928 by Donzetta Galli I, RN Outcome: Progressing   Problem: Clinical Measurements: Goal: Ability to maintain clinical measurements within normal limits will improve 04/06/2024 1929 by Asencion Lawless, RN Outcome: Progressing 04/06/2024 1928 by Donzetta Galli I, RN Outcome: Progressing Goal: Will remain free from infection 04/06/2024 1929 by Asencion Lawless, RN Outcome: Progressing 04/06/2024 1928 by Donzetta Galli I, RN Outcome: Progressing Goal: Diagnostic test results will improve 04/06/2024 1929 by Asencion Lawless, RN Outcome: Progressing 04/06/2024 1928 by Donzetta Galli I, RN Outcome: Progressing Goal: Respiratory complications will improve 04/06/2024 1929 by Asencion Lawless, RN Outcome: Progressing 04/06/2024 1928 by Donzetta Galli I, RN Outcome: Progressing Goal: Cardiovascular complication will be avoided 04/06/2024 1929 by Asencion Lawless, RN Outcome: Progressing 04/06/2024 1928 by Asencion Lawless, RN Outcome: Progressing

## 2024-04-06 NOTE — Plan of Care (Signed)
  Problem: Education: Goal: Knowledge of General Education information will improve Description: Including pain rating scale, medication(s)/side effects and non-pharmacologic comfort measures 04/06/2024 1932 by Asencion Lawless, RN Outcome: Progressing 04/06/2024 1929 by Asencion Lawless, RN Outcome: Progressing 04/06/2024 1928 by Asencion Lawless, RN Outcome: Progressing   Problem: Health Behavior/Discharge Planning: Goal: Ability to manage health-related needs will improve 04/06/2024 1932 by Asencion Lawless, RN Outcome: Progressing 04/06/2024 1929 by Asencion Lawless, RN Outcome: Progressing 04/06/2024 1928 by Donzetta Galli I, RN Outcome: Progressing   Problem: Clinical Measurements: Goal: Ability to maintain clinical measurements within normal limits will improve 04/06/2024 1932 by Asencion Lawless, RN Outcome: Progressing 04/06/2024 1929 by Asencion Lawless, RN Outcome: Progressing 04/06/2024 1928 by Donzetta Galli I, RN Outcome: Progressing Goal: Will remain free from infection 04/06/2024 1932 by Asencion Lawless, RN Outcome: Progressing 04/06/2024 1929 by Asencion Lawless, RN Outcome: Progressing 04/06/2024 1928 by Donzetta Galli I, RN Outcome: Progressing Goal: Diagnostic test results will improve 04/06/2024 1932 by Asencion Lawless, RN Outcome: Progressing 04/06/2024 1929 by Asencion Lawless, RN Outcome: Progressing 04/06/2024 1928 by Donzetta Galli I, RN Outcome: Progressing Goal: Respiratory complications will improve 04/06/2024 1932 by Asencion Lawless, RN Outcome: Progressing 04/06/2024 1929 by Asencion Lawless, RN Outcome: Progressing 04/06/2024 1928 by Donzetta Galli I, RN Outcome: Progressing Goal: Cardiovascular complication will be avoided 04/06/2024 1932 by Asencion Lawless, RN Outcome: Progressing 04/06/2024 1929 by Asencion Lawless, RN Outcome: Progressing 04/06/2024 1928 by Donzetta Galli I, RN Outcome: Progressing   Problem: Clinical  Measurements: Goal: Will remain free from infection 04/06/2024 1932 by Asencion Lawless, RN Outcome: Progressing 04/06/2024 1929 by Asencion Lawless, RN Outcome: Progressing 04/06/2024 1928 by Donzetta Galli I, RN Outcome: Progressing   Problem: Clinical Measurements: Goal: Will remain free from infection 04/06/2024 1932 by Asencion Lawless, RN Outcome: Progressing 04/06/2024 1929 by Asencion Lawless, RN Outcome: Progressing 04/06/2024 1928 by Asencion Lawless, RN Outcome: Progressing   Problem: Activity: Goal: Risk for activity intolerance will decrease 04/06/2024 1932 by Asencion Lawless, RN Outcome: Progressing 04/06/2024 1929 by Asencion Lawless, RN Outcome: Progressing 04/06/2024 1928 by Donzetta Galli I, RN Outcome: Progressing   Problem: Nutrition: Goal: Adequate nutrition will be maintained 04/06/2024 1932 by Asencion Lawless, RN Outcome: Progressing 04/06/2024 1929 by Asencion Lawless, RN Outcome: Progressing 04/06/2024 1928 by Donzetta Galli I, RN Outcome: Progressing   Problem: Coping: Goal: Level of anxiety will decrease 04/06/2024 1932 by Asencion Lawless, RN Outcome: Progressing 04/06/2024 1929 by Asencion Lawless, RN Outcome: Progressing 04/06/2024 1928 by Asencion Lawless, RN Outcome: Progressing

## 2024-04-06 NOTE — Progress Notes (Signed)
 Pharmacy Antibiotic Note  Jennifer [name] is a 63 y.o. female admitted on 04/05/2024 with  wound infection .  Pharmacy has been consulted for cefepime and vancomycin dosing.  Plan: Cefepime 2g q12h  Vancomycin 1000mg  q24h for eAUC: 483. Vd: 0.5 (BMI: 0.5), Scr: 1.13. Follow culture data for de-escalation.  Monitor renal function for dose adjustments as indicated.   Height: 5\' 5"  (165.1 cm) Weight: 99.8 kg (220 lb) IBW/kg (Calculated) : 57  Temp (24hrs), Avg:100 F (37.8 C), Min:99.2 F (37.3 C), Max:100.8 F (38.2 C)  Recent Labs  Lab 04/05/24 2203 04/05/24 2304  WBC 16.0*  --   CREATININE 1.13*  --   LATICACIDVEN  --  1.2    Estimated Creatinine Clearance: 58.1 mL/min (A) (by C-G formula based on SCr of 1.13 mg/dL (H)).    Allergies  Allergen Reactions   Benazepril Swelling    Possible tongue swelling 06/14/2021   Chantix [Varenicline] Other (See Comments)    Unknown reaction   Ioversol    Morphine Sulfate    Abilify [Aripiprazole] Other (See Comments)    Urinary freq Nov 2016   Iohexol Other (See Comments)    IV CONTRAST CAUSED TRANSIENT NEPHROPATHY (KIDNEY INSUFFICIENCY) IN 2007    Ivp Dye [Iodinated Contrast Media] Other (See Comments)    IV CONTRAST CAUSED TRANSIENT NEPHROPATHY (KIDNEY INSUFFICIENCY) IN 2007    Antimicrobials this admission: Cefepime 4/15 >>  Vancomycin 4/15 >>   Microbiology results: 4/15 BCx:   Thank you for allowing pharmacy to be a part of this patient's care.  Mamie Searles, PharmD, BCCCP  04/06/2024 7:10 AM

## 2024-04-06 NOTE — Hospital Course (Addendum)
 Recurrent R foot osteomyelitis s/p BKA 4/21 R fifth metatarsal osteomyelitis s/p resection 4/8 Sepsis, resolved  Patient presented with worsening pain, fever, and malaise after recent admission for R foot osteomyelitis requiring transmetatarsal amputation by podiatry. After discharge she developed a fever with worsening foot pain. She was not able to tolerate the full three days of Augmentin  prescribed at discharge. In the ED patient was septic with a fever and hypotension. MRI showed recurrent osteomyelitis primarily along the endosteum of the fourth metatarsal and distal cuboid, as well as a likely second wound along the distal margin of the amputation site. Broad spectrum antibiotics were initiated with cefepime , vancomycin , and metronidazole , as well as IV fluids for volume resuscitation. Patient had defervesced and was HDS by hospital day 1. Vascular surgery team was consulted, who performed a right below-the-knee amputation on 4/21. Antibiotics were discontinued 48 hours after the surgery. The patient tolerated the procedure well, and was discharged to a skilled nursing facility on 4/28 for further therapy and rehabilitation. She will need dry dressing changes with ACE dressing daily as needed.   T2DM Blood sugar was maintained within normal range with semglee  40 units daily, 5 units of mealtime novolog , moderate SSI with meals and at bedtime.   HTN: Normotensive on home amlodipine  10mg  HLD, PAD: continued home rosuvastatin  20mg  Chronic back pain: continued home pregabalin  200mg  TID COPD: continued home Anoro Ellipta  Depression: continued home duloxetine  60mg  GERD: continued home pantoprazole  40mg  CKD2: stable, at baseline Urinary incontinence: continued home Myrbetriq  Gastroparesis: Reglan  10mg  qid

## 2024-04-07 ENCOUNTER — Other Ambulatory Visit (HOSPITAL_COMMUNITY): Payer: Self-pay

## 2024-04-07 ENCOUNTER — Other Ambulatory Visit: Payer: Self-pay

## 2024-04-07 DIAGNOSIS — A419 Sepsis, unspecified organism: Secondary | ICD-10-CM | POA: Diagnosis not present

## 2024-04-07 DIAGNOSIS — T8141XA Infection following a procedure, superficial incisional surgical site, initial encounter: Secondary | ICD-10-CM | POA: Diagnosis not present

## 2024-04-07 DIAGNOSIS — E1143 Type 2 diabetes mellitus with diabetic autonomic (poly)neuropathy: Secondary | ICD-10-CM | POA: Insufficient documentation

## 2024-04-07 DIAGNOSIS — M86171 Other acute osteomyelitis, right ankle and foot: Secondary | ICD-10-CM | POA: Diagnosis not present

## 2024-04-07 DIAGNOSIS — E1169 Type 2 diabetes mellitus with other specified complication: Secondary | ICD-10-CM | POA: Diagnosis not present

## 2024-04-07 DIAGNOSIS — T8781 Dehiscence of amputation stump: Secondary | ICD-10-CM

## 2024-04-07 LAB — CBC
HCT: 32.3 % — ABNORMAL LOW (ref 36.0–46.0)
Hemoglobin: 10.7 g/dL — ABNORMAL LOW (ref 12.0–15.0)
MCH: 30.1 pg (ref 26.0–34.0)
MCHC: 33.1 g/dL (ref 30.0–36.0)
MCV: 91 fL (ref 80.0–100.0)
Platelets: 250 10*3/uL (ref 150–400)
RBC: 3.55 MIL/uL — ABNORMAL LOW (ref 3.87–5.11)
RDW: 13.5 % (ref 11.5–15.5)
WBC: 8.6 10*3/uL (ref 4.0–10.5)
nRBC: 0 % (ref 0.0–0.2)

## 2024-04-07 LAB — BASIC METABOLIC PANEL WITH GFR
Anion gap: 11 (ref 5–15)
BUN: 7 mg/dL — ABNORMAL LOW (ref 8–23)
CO2: 23 mmol/L (ref 22–32)
Calcium: 8.4 mg/dL — ABNORMAL LOW (ref 8.9–10.3)
Chloride: 100 mmol/L (ref 98–111)
Creatinine, Ser: 0.86 mg/dL (ref 0.44–1.00)
GFR, Estimated: >60 mL/min (ref >60)
Glucose, Bld: 179 mg/dL — ABNORMAL HIGH (ref 70–99)
Potassium: 4.1 mmol/L (ref 3.5–5.1)
Sodium: 134 mmol/L — ABNORMAL LOW (ref 135–145)

## 2024-04-07 LAB — GLUCOSE, CAPILLARY
Glucose-Capillary: 154 mg/dL — ABNORMAL HIGH (ref 70–99)
Glucose-Capillary: 238 mg/dL — ABNORMAL HIGH (ref 70–99)
Glucose-Capillary: 242 mg/dL — ABNORMAL HIGH (ref 70–99)
Glucose-Capillary: 318 mg/dL — ABNORMAL HIGH (ref 70–99)

## 2024-04-07 MED ORDER — VANCOMYCIN HCL 1250 MG/250ML IV SOLN
1250.0000 mg | INTRAVENOUS | Status: DC
  Administered 2024-04-07 – 2024-04-08 (×2): 1250 mg via INTRAVENOUS
  Filled 2024-04-07 (×2): qty 250

## 2024-04-07 MED ORDER — SODIUM CHLORIDE 0.9 % IV SOLN
3.0000 g | Freq: Four times a day (QID) | INTRAVENOUS | Status: AC
  Administered 2024-04-07 – 2024-04-13 (×24): 3 g via INTRAVENOUS
  Filled 2024-04-07 (×25): qty 8

## 2024-04-07 MED ORDER — SODIUM CHLORIDE 0.9 % IV SOLN
2.0000 g | Freq: Three times a day (TID) | INTRAVENOUS | Status: DC
  Administered 2024-04-07: 2 g via INTRAVENOUS
  Filled 2024-04-07 (×2): qty 12.5

## 2024-04-07 MED ORDER — METOCLOPRAMIDE HCL 5 MG PO TABS
10.0000 mg | ORAL_TABLET | Freq: Three times a day (TID) | ORAL | Status: DC
  Administered 2024-04-07 – 2024-04-18 (×44): 10 mg via ORAL
  Filled 2024-04-07 (×45): qty 2

## 2024-04-07 NOTE — Progress Notes (Signed)
 Pharmacy Antibiotic Note  Jennifer [name] is a 63 y.o. female admitted on 04/05/2024 with  wound infection .  Pharmacy has been consulted for cefepime and vancomycin dosing. Renal function continues to improve, will adjust antibiotics accordingly.   Plan: Vancomycin 1250 mg IV Q24h (eAUC 470, goal AUC 400-550, Scr 0.86, Vd 0.5) Cefepime 2g Q12h > Q8h Trend WBC, fever, renal function F/u cultures, clinical progress, levels as indicated De-escalate when able Monitor renal function for dose adjustments as indicated.   Height: 5\' 5"  (165.1 cm) Weight: 99.8 kg (220 lb) IBW/kg (Calculated) : 57  Temp (24hrs), Avg:98.2 F (36.8 C), Min:97.8 F (36.6 C), Max:99.1 F (37.3 C)  Recent Labs  Lab 04/05/24 2203 04/05/24 2304 04/06/24 0723 04/07/24 0603  WBC 16.0*  --  11.7* 8.6  CREATININE 1.13*  --   --  0.86  LATICACIDVEN  --  1.2  --   --     Estimated Creatinine Clearance: 76.3 mL/min (by C-G formula based on SCr of 0.86 mg/dL).    Allergies  Allergen Reactions   Benazepril Swelling    Possible tongue swelling 06/14/2021   Chantix [Varenicline] Other (See Comments)    Unknown reaction   Ioversol    Morphine Sulfate    Abilify [Aripiprazole] Other (See Comments)    Urinary freq Nov 2016   Iohexol Other (See Comments)    IV CONTRAST CAUSED TRANSIENT NEPHROPATHY (KIDNEY INSUFFICIENCY) IN 2007    Ivp Dye [Iodinated Contrast Media] Other (See Comments)    IV CONTRAST CAUSED TRANSIENT NEPHROPATHY (KIDNEY INSUFFICIENCY) IN 2007    Antimicrobials this admission: Cefepime 4/15 >>  Vancomycin 4/15 >>   Microbiology results: 4/08 Wound Cx: GPRs, GNRs, GPCs 4/15 BCx: ngtd x1d  Thank you for allowing pharmacy to be a part of this patient's care.  Claudia Cuff, PharmD, BCPS Clinical Pharmacist

## 2024-04-07 NOTE — Progress Notes (Signed)
 Mobility Specialist Progress Note:   04/07/24 1215  Mobility  Activity Refused mobility   Pt refused mobility d/t nausea. Will f/u as able.    Jennifer [name]  Mobility Specialist Please contact via [employer] office at [phone]
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
## 2024-04-07 NOTE — Progress Notes (Addendum)
   PODIATRY PROGRESS NOTE Patient Name: Jennifer [name]  DOB 28-Oct-1961 DOA 04/05/2024  Hospital Day: 3  Assessment:  63 y.o. female with PMHx significant for  HTN, T2DM, COPD, left BKA (2014) and right TMA (01/2023), CKD2  with osteomyelitis of the 4th metatarsal and cuboid and necrotic surgical site s/p recent 5th met resection abx beads and wound vac app 03/29/24.   WBC 8.6 A1c 9.8   MRI  R foot 4/16: Osteomyelitis of the 4th metatarsal and distal cuboid.   Plan:  - Unfortunately do not feel the limb is salvageable at this time. This is evidenced by rapid regression of the ulceration and multifocal osteomyelitis despite recent 03/29/24 debridement with abx beads and wound vac. Due to severe PAD s/p revascularization and necrotic appearance of the ulceration, it is highly improbable she will heal any wound in the foot level at this time.  - Recommend orthopedics or vascular consultation for proximal limb amputation.  - Betadine wet to dry dressing until surgery, change daily - Continue IV abx broad spectrum - Will sign off please message with questions or concerns.   Corinna Gab, DPM [facility]    Subjective:  Discussed with patient that unfortunately I do not feel her limb is salvageable. She was understandably upset at this news. Discussed that it would be another surgeon performing the procedure.   Objective:   Vitals:   04/07/24 0434 04/07/24 0757  BP: (!) 142/64 (!) 140/65  Pulse: 67 76  Resp: 16 17  Temp: 98.3 F (36.8 C) 97.9 F (36.6 C)  SpO2: 91% 100%       Latest Ref Rng & Units 04/07/2024    6:03 AM 04/06/2024    7:23 AM 04/05/2024   10:03 PM  CBC  WBC 4.0 - 10.5 K/uL 8.6  11.7  16.0   Hemoglobin 12.0 - 15.0 g/dL 40.9  81.1  91.4   Hematocrit 36.0 - 46.0 % 32.3  31.1  34.5   Platelets 150 - 400 K/uL 250  256  268        Latest Ref Rng & Units 04/07/2024    6:03 AM 04/05/2024   10:03 PM 03/29/2024    4:56 AM  BMP  Glucose 70 - 99 mg/dL  782  956  213   BUN 8 - 23 mg/dL 7  11  8    Creatinine 0.44 - 1.00 mg/dL 0.86  5.78  4.69   Sodium 135 - 145 mmol/L 134  131  135   Potassium 3.5 - 5.1 mmol/L 4.1  4.5  4.4   Chloride 98 - 111 mmol/L 100  96  97   CO2 22 - 32 mmol/L 23  24  27    Calcium 8.9 - 10.3 mg/dL 8.4  8.9  8.7     General: AAOx3, NAD  Lower Extremity Exam Dressing C/D/I  From yesterdays exam: General AA&O x3. Normal mood and affect.  Vascular Right foot nonpalpable pulses but foot is relatively warm at the midfoot and cool at the distal apex  Neurologic Epicritic sensation grossly absent.  Dermatologic (Wound) Full-thickness ulcer lateral foot with exposed necrotic tissue and bone.  Distal epidermal lysis with seropurulent drainage       Orthopedic: Left AKA     Radiology:  Results reviewed. See assessment for pertinent imaging results

## 2024-04-07 NOTE — Progress Notes (Signed)
                  Subjective:   Summary: 63 yo F with PMH of insulin dependent T2DM, COPD, CKD 2, L BKA in 2014, R TMA in 2024, resection of R fifth metatarsal on 4/8 due to osteomyelitis, readmitted for sepsis 2/2 post-op wound infection and osteomyelitis.   Patient is feeling okay this morning. She is nauseous and threw up earlier this morning. This is a common occurrence with her history of gastroparesis. Updated her that she will likely need surgery, the extent of which is still unsure. Dr Broadus Canes will speak with her later on the likely possibility of amputation.   Objective:  Vital signs in last 24 hours: Vitals:   04/06/24 1257 04/06/24 1710 04/06/24 2031 04/07/24 0434  BP: 116/88 (!) 130/53 (!) 123/59 (!) 142/64  Pulse: 73 70 73 67  Resp: 18 17 16 16   Temp: 97.8 F (36.6 C) 98.1 F (36.7 C) 98.2 F (36.8 C) 98.3 F (36.8 C)  TempSrc: Oral  Oral   SpO2: 97% 91% 98% 91%  Weight:      Height:       Supplemental O2: Room Air SpO2: 91 %  Physical Exam:  Constitutional: nontoxic, uncomfortable appearing Pulmonary/Chest: normal work of breathing, no respiratory distress Neuro: A&O x 3 Extremities: R foot with clean dry bandage  Assessment/Plan:   Post-op R foot wound infection and osteomyelitis R fifth metatarsal osteomyelitis s/p resection 4/8 Sepsis, improving Patient is now afebrile with stable vitals, improved leukocytosis. MRI showing osteomyelitis of fourth metatarsal and distal cuboid. Maximal revascularization performed by [facility] at last admission. Dr Rosemarie Conquest who performed the previous resection has stated she will likely need amputation.  - Will discuss with ortho/vascular surgery who will perform likely amputation - Continue broad spectrum antibiotics with cefepime, vancomycin, metronidazole - Blood cultures with no growth less than 12 hours - Scheduled Tylenol 1000mg  q8h, oxycodone 10mg  q4h prn, pregabalin 200mg  TID - WOC consulted  T2DM Home regimen  includes 65 units Degludec, metformin 500mg  daily, semaglutide 1mg . Blood sugar has been at goal.  - Semglee 30 units daily, SSI TID and at bedtime - Inpatient diabetes coordinator following, appreciate recommendations  HTN Blood pressure continues to be at goal. Continue holding home antihypertensives  HLD, PAD: continue home rosuvastatin 20mg  Chronic back pain: continue home pregabalin 200mg  TID COPD: continue home Anoro Ellipta Depression: continue home duloxetine 60mg  GERD: continue home pantoprazole 40mg  CKD2: Stable, monitor with morning labs Urinary incontinence: continue home Myrbetriq  Diet: HH/Carb modified VTE: Lovenox Code: Full  Dispo: Anticipated discharge to home pending medical stability.   Jayson Michael, MD PGY-1 Internal Medicine Resident Pager Number [phone] Please contact the on call pager after 5 pm and on weekends at [phone].

## 2024-04-07 NOTE — Consult Note (Addendum)
 Hospital Consult    Reason for Consult:  non healing TMA Requesting Physician:  Internal Medicine MRN #:  [id number]  History of Present Illness: This is a 63 y.o. female with past medical history significant for hypertension, hyperlipidemia, COPD, type 2 diabetes mellitus with recent hemoglobin A1c of 9.8.  She is status post right lower extremity arteriogram with drug-coated balloon angioplasty of the TP trunk by Dr. Susi Eric on 03/28/2024.  She subsequently underwent debridement of right TMA wound by podiatry.  She was discharged from the hospital on p.o. antibiotics.  While at home she experienced fevers and chills as well as malfunctioning wound VAC which brought her back to the emergency department yesterday.  Unfortunately infection and necrotic tissue is advancing rapidly.  She understands she will require a major leg amputation.  She would prefer leg amputation by [facility].  She had been ambulatory with a prosthetic for her left BKA.  Past Medical History:  Diagnosis Date   Acute vestibular syndrome, resolved 03/02/2021   Angioedema 06/14/2021   Asthma    Burn of finger of right hand, second degree 02/05/2021   Occurred during cooking (frying), poor sensation due to neuropathy, pt punctured blister to allow it to drain, skin has since desquamated over dorsal joint, no infection.  Keep clean and dry, OTC antibacterial ointment.   Cataract    CHF (congestive heart failure) (HCC)    Chronic bronchitis (HCC)    "I get it alot" (09/28/2013)   Chronic diastolic heart failure (HCC)    grade 2 per 2D echocardiogram (01/2013)   Chronic kidney disease    Chronic lower back pain    Chronic pain syndrome 12/03/2011   Likely secondary to depression, "fibromyalgia", neuropathy, and obesity. Lumbar MRI 2014 no sig change from prior (2008) : Stable hypertrophic facet disease most notable at L4-5. Stable shallow left foraminal/extraforaminal disc protrusion at L4-5. No direct neural compression.        Chronic right shoulder pain 10/10/2021   COPD 01/08/2007   PFT's 05/2007 : FEV1/FVC 82, FEV1 64% pred, FEF 25-75% 40% predicted, 16% improvement in FEV1 with bronchodilators.      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 Right brachial vein, diagnosed on 03/10/2013 Coumadin for 3 months. 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  S. Aureus and GBS.    History of use of hearing aid    Hyperlipidemia    Hyperplastic colon polyp 12/2010   Per colonoscopy (12/2010) - Dr. 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)    Diagnosed age 67; treated initially with "lots of aspirin"   Nausea and vomiting 11/24/2008   Parotid nodules, resected 05/2022, benign 07/11/2021   Incidental finding 03/04/21  "multiple bilateral parotid  nodules the largest in the right gland measuring 11 mm", asymptomatic.       Initially evaluated 08/2019 with dedicated MRI: "IMPRESSION:  Skin marker overlies a 9 x 10 x 11 mm cyst within the anterior  aspect of the superficial lobe of the right parotid gland. This is  presumed to be a benign cyst. The possibility of a cystic Warthin's  tumor   Pneumonia    PVD (peripheral vascular disease) with claudication (HCC)    Stents to bilateral common iliac arteries (left 2005, right 2008), on chronic plavix    Pyelonephritis 10/28/2020   S/P BKA (below knee amputation) unilateral (HCC)    2014 L - failed limb preserving treatment. 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  Evaluated on 07/08/2021 by podiatrist Dr. Luster Salters who debrided necrotic tissue and prescribed doxycycline.  He will see her again in 3 weeks.  The location of this ulcer on the dorsal aspect of the toe is somewhat atypical for a purely diabetic foot wound, and she does have a strong DP pulse.  I did not examine h   Type II diabetes mellitus with peripheral circulatory disorders, uncontrolled DX: 1993   Insulin dep. Poor control. Complicated by diabetic foot ulcer and diabetic eye disease.     Upper respiratory tract infection due to COVID-19 virus 07/22/2022   Urinary incontinence, severe, mixed (stress, urge, functional) 02/14/2023   Unresponsive to medications    Past Surgical History:  Procedure Laterality Date   ABDOMINAL AORTOGRAM W/LOWER EXTREMITY N/A 03/28/2024   Procedure: ABDOMINAL AORTOGRAM W/LOWER EXTREMITY;  Surgeon: Philipp Brawn, MD;  [facility]VAS[facility] CV LAB;  Service: Cardiovascular;  Laterality: N/A;   ABDOMINAL HYSTERECTOMY  1997   secondary to uterine fibroids   AMPUTATION Left 08/31/2013   Procedure: AMPUTATION RAY;  Surgeon: Timothy Ford, MD;  Location: [facility] OR;  Service: Orthopedics;  Laterality: Left;  Left Foot 5th Ray Amputation   AMPUTATION Left 09/28/2013   Procedure: Left Midfoot amputation;  Surgeon: Timothy Ford, MD;  Location: [facility] OR;  Service: Orthopedics;  Laterality: Left;  Left Midfoot amputation   AMPUTATION Left 10/14/2013   Procedure: AMPUTATION BELOW KNEE- left;  Surgeon: Timothy Ford, MD;  Location: [facility] OR;  Service: Orthopedics;  Laterality: Left;  Left  Below Knee Amputation    AMPUTATION TOE Right 01/15/2017   Procedure: AMPUTATION 5th TOE RIGHT FOOT;  Surgeon: Dot Gazella, DPM;  Location: [facility] OR;  Service: Podiatry;  Laterality: Right;   AMPUTATION TOE Right 02/12/2023   Procedure: Right Foot Transmetatarsal Amputation;  Surgeon: Charity Conch, DPM;  Location: [facility] OR;  Service: Podiatry;  Laterality: Right;   APPLICATION OF WOUND VAC  04/01/2019   Procedure: Application Of Wound Vac;  Surgeon: Timothy Ford, MD;  Location: [facility] OR;  Service: Orthopedics;;   BLADDER SURGERY     bladder reconstruction surgery   BOTOX INJECTION N/A 08/21/2021   Procedure: CYSTOSCOPY BOTOX INJECTION;  Surgeon: Samson Croak, MD;  Location: [facility] ORS;  Service: Urology;  Laterality: N/A;   BREAST BIOPSY     multiple-benign per pt   CATARACT EXTRACTION, BILATERAL     summer 2022   COLONOSCOPY     DILATION AND CURETTAGE  OF UTERUS  1985   ESOPHAGOGASTRODUODENOSCOPY N/A 09/20/2013   Procedure: ESOPHAGOGASTRODUODENOSCOPY (EGD);  Surgeon: Beverley Fiedler, MD;  Location: [facility] ENDOSCOPY;  Service: Gastroenterology;  Laterality: N/A;   EYE SURGERY Bilateral 2022   Cataract removal in June and then July   FOOT AMPUTATION THROUGH METATARSAL Left 09/28/2013   GANGLION CYST EXCISION     multiple   LOWER EXTREMITY INTERVENTION Right 03/28/2024   Procedure: LOWER EXTREMITY INTERVENTION;  Surgeon: Daria Pastures, MD;  Location: [facility] INVASIVE CV LAB;  Service: Cardiovascular;  Laterality: Right;   METATARSAL HEAD EXCISION Right 03/29/2024   Procedure: EXCISION, METATARSAL BONE, HEAD;  Surgeon: Pilar Plate, DPM;  Location: [facility] OR;  Service: Orthopedics/Podiatry;  Laterality: Right;  Irrigation and Debridment right foot with partial fifth met resection, antibiotic beads, wound vac   PAROTIDECTOMY Right 05/14/2022   Procedure: PAROTIDECTOMY;  Surgeon: Christia Reading, MD;  Location: [facility] OR;  Service: ENT;  Laterality: Right;   PERIPHERAL VASCULAR INTERVENTION     stents  in lower ext   PREAURICULAR CYST EXCISION N/A 05/14/2022   Procedure: EXCISION OF SCALP SKIN CYST, 1.5cm;  Surgeon: Christia Reading, MD;  Location: [facility] OR;  Service: ENT;  Laterality: N/A;   SHOULDER ARTHROSCOPY Right 11/11/2019   RIGHT SHOULDER ARTHROSCOPY AND DEBRIDEMENT    SHOULDER ARTHROSCOPY Right 11/11/2019   Procedure: RIGHT SHOULDER ARTHROSCOPY AND DEBRIDEMENT;  Surgeon: Nadara Mustard, MD;  Location: [facility] OR;  Service: Orthopedics;  Laterality: Right;   SHOULDER ARTHROSCOPY W/ ROTATOR CUFF REPAIR Bilateral    2 on right one on left   SKIN SPLIT GRAFT Bilateral 05/13/2013   Procedure: Right and Left Foot Allograft Skin Graft;  Surgeon: Nadara Mustard, MD;  Location: [facility] OR;  Service: Orthopedics;  Laterality: Bilateral;  Right and Left Foot Allograft Skin Graft   STUMP REVISION Left 04/01/2019   Procedure: REVISION LEFT BELOW KNEE AMPUTATION;  Surgeon: Nadara Mustard, MD;  Location: [facility] OR;  Service: Orthopedics;  Laterality: Left;   TEE WITHOUT CARDIOVERSION N/A 01/31/2013   Procedure: TRANSESOPHAGEAL ECHOCARDIOGRAM (TEE);  Surgeon: Pricilla Riffle, MD;  Location: [facility] ENDOSCOPY;  Service: Cardiovascular;  Laterality: N/A;  Rm [id number]   TEE WITHOUT CARDIOVERSION N/A 03/10/2013   Procedure: TRANSESOPHAGEAL ECHOCARDIOGRAM (TEE);  Surgeon: Laurey Morale, MD;  Location: [facility] ENDOSCOPY;  Service: Cardiovascular;  Laterality: N/A;  Rm. [id number]   TOE AMPUTATION Left 08/31/2013   4TH & 5 TH TOE    TONSILLECTOMY     TUBAL LIGATION     WRIST SURGERY Right    "for tumors" (09/28/2013)    Allergies  Allergen Reactions   Benazepril Swelling    Possible tongue swelling 06/14/2021   Chantix [Varenicline] Other (See Comments)    Unknown reaction   Ioversol    Morphine Sulfate    Abilify [Aripiprazole] Other (See Comments)    Urinary freq Nov 2016   Iohexol Other (See Comments)    IV CONTRAST CAUSED TRANSIENT NEPHROPATHY (KIDNEY INSUFFICIENCY) IN 2007    Ivp Dye [Iodinated Contrast Media] Other (See  Comments)    IV CONTRAST CAUSED TRANSIENT NEPHROPATHY (KIDNEY INSUFFICIENCY) IN 2007    Prior to Admission medications   Medication Sig Start Date End Date Taking? Authorizing Provider  Accu-Chek Softclix Lancets lancets Use as back up to CGM sensors up to 1 time a day 11/17/23  Yes Miguel Aschoff, MD  acetaminophen (TYLENOL) 650 MG CR tablet Take 2 tablets (1,300 mg total) by mouth every 8 (eight) hours as needed  for pain. Patient taking differently: Take 1,300 mg by mouth daily as needed for pain. 02/16/23  Yes Mapp, Tavien, MD  albuterol (PROAIR HFA) 108 (90 Base) MCG/ACT inhaler INHALE 2 PUFFS BY MOUTH EVERY 6 HOURS AS NEEDED FOR WHEEZING 11/04/23  Yes Sherol Dixie, MD  amLODipine (NORVASC) 10 MG tablet Take 1 tablet (10 mg total) by mouth daily. 08/06/23  Yes Sherol Dixie, MD  Baclofen 5 MG TABS Take 1 tablet (5 mg total) by mouth 2 (two) times daily as needed (muscle spasms). 02/18/24  Yes Sherol Dixie, MD  Continuous Glucose Sensor (DEXCOM G7 SENSOR) MISC Place new sensor every 10 days. Use to monitor blood sugar continuously. 11/17/23  Yes Sherol Dixie, MD  DULoxetine (CYMBALTA) 60 MG capsule Take 1 capsule (60 mg total) by mouth daily. 02/18/24  Yes Sherol Dixie, MD  glucose blood (ACCU-CHEK GUIDE TEST) test strip Use as back up to CGM sensors up to 1 time a day 11/17/23  Yes Sherol Dixie, MD  insulin degludec (TRESIBA FLEXTOUCH) 200 UNIT/ML FlexTouch Pen Inject 65 Units into the skin daily. 02/18/24  Yes Sherol Dixie, MD  Insulin Pen Needle 32G X 4 MM MISC Use to inject insulin 4 (four) times daily. 02/18/24  Yes Sherol Dixie, MD  lidocaine (LIDODERM) 5 % Place 1 patch onto the skin daily. Remove & Discard patch within 12 hours or as directed by MD 03/15/24  Yes Almond Army, MD  metFORMIN (GLUCOPHAGE-XR) 500 MG 24 hr tablet Take 1 tablet (500 mg total) by mouth daily with breakfast. 08/06/23  Yes Sherol Dixie, MD   methocarbamol (ROBAXIN) 500 MG tablet Take 1 tablet (500 mg total) by mouth 2 (two) times daily. Patient taking differently: Take 500 mg by mouth 3 (three) times daily as needed for muscle spasms. 03/15/24  Yes Almond Army, MD  metoprolol succinate (TOPROL-XL) 100 MG 24 hr tablet Take 1 tablet (100 mg total) by mouth daily. TAKE WITH OR IMMEDIATELY FOLLOWING A MEAL. 02/18/24  Yes Sherol Dixie, MD  MYRBETRIQ 50 MG TB24 tablet Take 1 tablet (50 mg total) by mouth daily. 02/18/24  Yes Sherol Dixie, MD  ondansetron (ZOFRAN-ODT) 4 MG disintegrating tablet Take 1 tablet (4 mg total) by mouth every 8 (eight) hours as needed for nausea or vomiting. Patient taking differently: Take 4 mg by mouth daily as needed for nausea or vomiting. 02/18/24  Yes Sherol Dixie, MD  oxyCODONE (OXY IR/ROXICODONE) 5 MG immediate release tablet Take 1 tablet (5 mg total) by mouth every 4 (four) hours as needed for up to 5 days. 03/31/24 04/06/24 Yes Sherol Dixie, MD  pantoprazole (PROTONIX) 40 MG tablet Take 1 tablet (40 mg total) by mouth daily. 07/14/23  Yes Sherol Dixie, MD  pregabalin (LYRICA) 200 MG capsule Take 1 capsule (200 mg total) by mouth 3 (three) times daily. 03/10/24  Yes Sherol Dixie, MD  rosuvastatin (CRESTOR) 20 MG tablet Take 1 tablet (20 mg total) by mouth in the morning. 02/18/24  Yes Sherol Dixie, MD  Semaglutide, 1 MG/DOSE, (OZEMPIC, 1 MG/DOSE,) 4 MG/3ML SOPN Inject 1 mg into the skin once a week. 08/06/23  Yes Sherol Dixie, MD  silver sulfADIAZINE (SILVADENE) 1 % cream Apply 1 Application topically daily. To the wounds on your leg and foot 03/22/24  Yes Almond Army, MD  spironolactone (ALDACTONE) 50 MG tablet Take 1 tablet (50 mg total) by mouth daily. Patient taking differently: Take 50  mg by mouth 2 (two) times daily. 02/18/24  Yes Sherol Dixie, MD  traZODone (DESYREL) 100 MG tablet Take 200 mg by mouth at bedtime as needed for  sleep.   Yes [provider]  umeclidinium-vilanterol (ANORO ELLIPTA) 62.5-25 MCG/ACT AEPB Inhale 1 puff into the lungs in the morning. 08/06/23  Yes Sherol Dixie, MD  metoCLOPramide (REGLAN) 5 MG tablet Take 1 tablet (5 mg total) by mouth 3 (three) times daily before meals. 04/06/24   Sherol Dixie, MD  Potassium Chloride ER 20 MEQ TBCR Take 40 mEq by mouth daily. 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   Vaping Use   Vaping status: Never Used  Substance and Sexual Activity   Alcohol use: No    Alcohol/week: 0.0 standard drinks of alcohol   Drug use: No    Types: Marijuana, "Crack" cocaine    Comment: 09/28/2013 "no marijuana since 2011, no crack/cocaine 1989"   Sexual activity: Not Currently  Other Topics Concern   Not on file  Social History Narrative   On disability. Lives with son in [place]. 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    Boyfriend passed away stage 4 cancer 04-05-13.   S/p L BKA 2014. In wheelchair in paritially suitable apartment.    Social Drivers of Corporate investment banker Strain: High Risk (03/31/2023)   Overall Financial Resource Strain (CARDIA)    Difficulty of Paying Living Expenses: Hard  Food Insecurity: No Food Insecurity (04/06/2024)   Hunger Vital Sign    Worried About Running Out of Food in the Last Year: Never true    Ran Out of Food in the Last Year: Never true   Transportation Needs: No Transportation Needs (04/06/2024)   PRAPARE - Administrator, Civil Service (Medical): No    Lack of Transportation (Non-Medical): No  Recent Concern: Transportation Needs - Unmet Transportation Needs (03/24/2024)   PRAPARE - Transportation    Lack of Transportation (Medical): Yes    Lack of Transportation (Non-Medical): Yes  Physical Activity: Inactive (03/31/2023)   Exercise Vital Sign    Days of Exercise per Week: 0 days    Minutes of Exercise per Session: 0 min  Stress: Stress Concern Present (03/31/2023)   [employer] of Occupational Health - Occupational Stress Questionnaire    Feeling of Stress : Very much  Social Connections: Socially Isolated (04/06/2024)   Social Connection and Isolation Panel [NHANES]    Frequency of Communication with Friends and Family: Three times a week    Frequency of Social Gatherings with Friends and Family: Once a week    Attends Religious Services: Never    Database administrator or Organizations: No    Attends Banker Meetings: Never    Marital Status: Separated  Intimate Partner  Violence: Not At Risk (04/06/2024)   Humiliation, Afraid, Rape, and Kick questionnaire    Fear of Current or Ex-Partner: No    Emotionally Abused: No    Physically Abused: No    Sexually Abused: No     Family History  Problem Relation Age of Onset   Diverticulosis Mother    Diabetes Mother    Hypertension Mother    Congestive Heart Failure Mother    Asthma Father    CAD Sister 71       MI at age 62 per patient.  However, she has not had a stent or CABG.    Heart disease Sister        before age 64   Colon cancer Brother    Breast cancer Neg Hx    Colon polyps Neg Hx    Rectal cancer Neg Hx    Stomach cancer Neg Hx    Esophageal cancer Neg Hx     ROS: Otherwise negative unless mentioned in HPI  Physical Examination  Vitals:   04/07/24 0434 04/07/24 0757  BP: (!) 142/64 (!) 140/65  Pulse: 67 76   Resp: 16 17  Temp: 98.3 F (36.8 C) 97.9 F (36.6 C)  SpO2: 91% 100%   Body mass index is 36.61 kg/m.  General:  WDWN in NAD Gait: Not observed HENT: WNL, normocephalic Pulmonary: normal non-labored breathing, without Rales, rhonchi,  wheezing Cardiac: regular Abdomen:  soft, NT/ND, no masses Skin: without rashes Vascular Exam/Pulses: Right lower extremity edema with absent pedal pulses Extremities: without ischemic changes, without Gangrene , without cellulitis; without open wounds;  Musculoskeletal: no muscle wasting or atrophy  Neurologic: A&O X 3;  No focal weakness or paresthesias are detected; speech is fluent/normal Psychiatric:  The pt has Normal affect. Lymph:  Unremarkable  CBC    Component Value Date/Time   WBC 8.6 04/07/2024 0603   RBC 3.55 (L) 04/07/2024 0603   HGB 10.7 (L) 04/07/2024 0603   HGB 12.2 09/01/2022 1000   HCT 32.3 (L) 04/07/2024 0603   HCT 38.5 09/01/2022 1000   PLT 250 04/07/2024 0603   PLT 175 09/01/2022 1000   MCV 91.0 04/07/2024 0603   MCV 93 09/01/2022 1000   MCH 30.1 04/07/2024 0603   MCHC 33.1 04/07/2024 0603   RDW 13.5 04/07/2024 0603   RDW 14.4 09/01/2022 1000   LYMPHSABS 2.6 04/05/2024 2203   LYMPHSABS 2.7 09/01/2022 1000   MONOABS 0.9 04/05/2024 2203   EOSABS 0.1 04/05/2024 2203   EOSABS 0.1 09/01/2022 1000   BASOSABS 0.1 04/05/2024 2203   BASOSABS 0.1 09/01/2022 1000    BMET    Component Value Date/Time   NA 134 (L) 04/07/2024 0603   NA 141 02/18/2024 0932   K 4.1 04/07/2024 0603   CL 100 04/07/2024 0603   CO2 23 04/07/2024 0603   GLUCOSE 179 (H) 04/07/2024 0603   BUN 7 (L) 04/07/2024 0603   BUN 10 02/18/2024 0932   CREATININE 0.86 04/07/2024 0603   CREATININE 0.68 01/31/2015 1641   CALCIUM 8.4 (L) 04/07/2024 0603   GFRNONAA >60 04/07/2024 0603   GFRNONAA >89 01/31/2015 1641   GFRAA 54 (L) 10/25/2020 1131   GFRAA >89 01/31/2015 1641    COAGS: Lab Results  Component Value Date   INR 1.3 (H) 04/05/2024    INR 1.1 10/31/2023   INR 1.1 02/10/2023     Non-Invasive Vascular Imaging:   MRI of the right foot suggesting midfoot osteomyelitis as well as underlying wound tracking  up the plantar lateral surface   ASSESSMENT/PLAN: This is a 63 y.o. female with nonhealing right TMA wound despite optimization of circulation with recent TP trunk drug-coated balloon angioplasty  Ms. Dorianna Mckiver is a 63 year old female who recently underwent right lower extremity angiography with drug-coated balloon angioplasty of the TP trunk.  She was considered optimized from a vascular surgery standpoint.  She subsequently underwent debridement of the right TMA wound.  Unfortunately she developed fevers and chills and return to the hospital.  Wound is worsening with evidence of osteomyelitis of the midfoot.  She will require right leg amputation.  Based on angiography the popliteal artery is widely patent.  We will proceed with right below the knee amputation.  She prefers that [facility] perform the amputation.  Continue IV antibiotics.  On-call vascular surgeon Dr. Susi Eric will evaluate the patient later today and provide further treatment plans including timing of her surgery.   Cordie Deters PA-C [facility] [phone]  VASCULAR STAFF ADDENDUM: I have independently interviewed and examined the patient. I agree with the above.  Nonhealing right TMA wound with infection.  We discussed that at this time there are no other options and she has been maximally revascularized.  Discussed BKA, reviewed risks and benefits.  She expressed understanding and elected to proceed. Will plan for a right BKA early next week.  Philipp Brawn MD [facility] Phone Number: [phone] 04/07/2024 4:01 PM

## 2024-04-07 NOTE — Progress Notes (Signed)
   04/07/24 1200  SDOH Interventions  Transportation Interventions Inpatient TOC   Resources placed on AVS

## 2024-04-07 NOTE — Discharge Instructions (Addendum)
 [facility] assistance programs. If you are behind on your bills and expenses, and need some help to make it through a short term hardship or financial emergency, there are several organizations and charities in the [place] area that may be able to help. They range from the [employer], [employer], Landscape architect of [place]  and the local community action agency, the [employer], [employer]. These groups may be able to provide you resources to help pay your utility bills, rent, and they even offer housing assistance.  Crisis assistance program Find help for paying your rent, electric bills, free food, and even funds to pay your mortgage. The [employer] (347) 441-3794) offers several services to local families, as funding allows. The Emergency Assistance Program (EAP), which they administer, provides household goods, free food, clothing, and financial aid to people in need in the [employer] [place]  area. The EAP program does have some qualification, and counselors will interview clients for financial assistance by written referral only. Referrals need to be made by the Department of Social Services or by other EAP approved human services agencies or charities in the area.  Money for resources for emergency assistance are available for security deposits for rent, water , electric, and gas, past due rent, utility bills, past due mortgage payments, food, and clothing. The [employer] also operates a Programme researcher, broadcasting/film/video on the site. More [employer].  [employer] of [employer], which can be reached at 310-207-9635, offers emergency assistance programs for those in need of help, such as food, rent assistance, a soup kitchen, shelter, and clothing. They are based in [facility] [place]  but provide a number of services to those that qualify for assistance. Continue with [employer]  programs.  [facility] Department of Social Services may be able to offer temporary financial assistance and cash grants for paying rent and utilities. Help may be provided for local county residents who may be experiencing personal crisis when other resources, including government programs, are not available. Call ([phone]  [employer], which is based in [place], provides financial assistance of up to $50.00 to help pay for rent, utilities, cooling bills, rent, and prescription medications. The program also provides secondhand furniture to those in need. 779-680-1949  [employer] is a Geneticist, molecular. The organization can offer emergency assistance for paying rent, electric bills, utilities, food, household products and furniture. They offer extensive emergency and transitional housing for families, children and single women, and also run a Boy's and Dole Food. [address] [employer], [employer], and other aid offered too. [address], [place], [place]  [address], [phone]  Additional locations of the [employer] are in [place] and other nearby communities. When you have an emergency, need free food, money for basic needs, or just need assistance around Christmas, then the [employer] may have the resources you need. Or they can refer you to nearby agencies. Learn more.  [place] Low Income Risk manager - This is offered for [facility] families. The federal government created [employer] Program provides a one-time cash grant payment to help eligible low-income families pay their electric and heating bills. [address], [place], [place]  [address], ([phone]  Government and [employer] - The county administers several emergency and self-sufficiency programs. Residents of [place] can get help with energy bills and food, rent, and  other expenses. In addition,  work with a Sports coach who may be able to help you find a job or improve your employment skills. More [place] public assistance.  [place] Emergency Assistance - A program offers emergency utility and rent funds for greater [place] area residents. The program can also provide counseling and referrals to charities and government programs. Also provides food and a free meal program that serves lunch Mondays - Saturdays and dinner seven days per week to individuals in the community. [address], [place], [place]  [address], [phone]  [employer] - Offers affordable apartment and housing communities across [place] and [place]. The low income and seniors can access public housing, rental assistance to qualified applicants, and apply for the section 8 rent subsidy program. Other programs include Chiropractor and Engineer, maintenance. [address], [place], [place]  [address], dial  (630)[phone].  Basic needs such as clothing - Low income families can receive free items (school supplies, clothes, holiday assistance, etc.) from clothing closets while more moderate income [address] families can shop at [employer]. Locations across the area help the needy. Get information on [place] free clothing centers.  The [facility] provides transitional housing to veterans and the disabled. Clients will also access other services too, including life skills classes, case management, and assistance in finding permanent housing. [address], [place], [place]  [address], call [phone]  [facility] [place] Transitional Housing in [place] is for people who were just evicted or that are formerly homeless. The non-profit will also help then gain self-sufficiency, find a home or apartment to live in, and also provides information on rent assistance when needed. Dial  (336)  ([phone])782-3707  [employer] Partnership to End Homelessness is available in [place]. Families that were evicted or that are homeless can gain shelter, food, clothing, furniture, and also emergency financial assistance. Other services include financial skills and life skills coaching, job training, and case management. [address] [place], [place] [address]. Telephone [phone].  The [employer], [employer]. runs the [employer]. This can help people save money on their heating and summer cooling bills, and is free to low income families. Free upgrades can be made to your home. Phone [phone]  Many of the non-profits and programs mentioned above are all inclusive, meaning they can meet many needs of the low income, such as energy bills, food, rent, and more. However there are several organizations that focus just on rent and housing. Read more on rent assistance in [place] region.  Legal assistance for evictions, foreclosure, and more If you need free legal advice on civili issues, such as foreclosures, evictions, Electronics engineer, government programs, domestic issues and more, Landscape architect of [place]  [facility]) is a Associate Professor firm that provides free legal services and counsel to lower income people, seniors, disabled, and others. The goal is to ensure everyone has access to justice and fair representation.  Call them at [phone], or click here to learn more about   free legal assistance programs.  [place] [employer] and funds for emergency expenses The [employer] is another organization that can provide people with [employer] and funds to pay bills. Their assistance depends on funding, and the demand for help is always very high. They can provide cash to help pay rent, a missed mortgage payment, or gas, electric, and water  bills. But the assistance doesn't stop there. They also have a food pantry  on site, which can provide  food once every three (3) months to people who need help. The [employer] can also offer a Engineering geologist once every three (3) months for a maximum three (3) times. After receiving this voucher over that period of time, applicants can receive this aid one every six (6) months after that. [phone].  [employer] action agency The [employer], [employer]. offers job and Dispensing optician. Resources are focused on helping students obtain the skills and experiences that are necessary to compete in today's challenging and tight job market. The non-profit faith-based community action agency offers internship trainings as well as classroom instruction. Economically disadvantaged and challenged individuals and potential employers can use their services. Classes are tailored to meet the needs of people in the [facility] region. [place], [place] [address], [phone]    Foreclosure prevention services Housing Counseling and Education is also offered by [employer] of the [place]. The agency (phone number is below) is a Engineer, structural providing foreclosure advice and counseling. They offer mortgage resolution counseling and also reverse mortgage counseling. Counselors can direct people to both [employer], as well as [employer]  foreclosure assistance options.  Warehouse manager has locations in [place] and [place]. They run debt and foreclosure prevention programs for local families. A sampling of the programs offered include both Budget and Housing Counseling. This includes money management, financial advice, budget review and development of a written action plan with a Pensions consultant to help solve specific individual financial problems. In addition, housing and mortgage counselors can also provide pre- and post-purchase  homeownership counseling, default resolution counseling (to prevent foreclosure) and reverse mortgage counseling. A Debt Management Program allows people and families with a high level of credit card or medical debt to consolidate and repay consumer debt and loans to creditors and rebuild positive credit ratings and scores. [phone] [phone]  Debt assistance programs Receive free counseling and debt help from [facility] of the [place]. The [facility] based agency can be reached at ([phone]. The counselors provide free help, and the services include budget counseling. This will help people manage their expenses and set goals. They also offer a Forensic scientist, which will help individuals consolidate their debts and become debt free. Most of the workshops and services are free.  Community clinics in [place] of the leading health and dental centers are listed below. They may be able to provide medication, physicals, dental care, and general family care to residents of all incomes and backgrounds across the region. Some of the programs focus on the low income and underinsured. However if these clinics can't meet your needs, find information and details on more clinics in [place] .  Some of the options include [employer] of [employer]. This center provides free or low cost health care to low-income adults 18 - 64, who have no health insurance. Among other services offered include a pharmacy and eye clinic. Phone [phone]  [facility], which is located in [place], is a community clinic that provides primary medical and health care to uninsured and underinsured adults and families, as well as the low income, in the greater [place] area on a sliding-fee scale. Call [phone]  [place] Adult [facility] Program - They run a dental assistance program that is organized by [facility] to provide dental services  and aid  to [employer]. Services offered by the dental clinic are limited to extractions, pain management, and minor restorative care. [phone]  [place] [facility] has locations in [place] and [place]. The community clinics provide complete pediatric care including primary health, mental health, social work, neurology, cardiology, asthma. Dial  (336) M7108216.  In addition to those [place] and [place] community clinics, find other free community clinics in [place]  and across the county.  Food pantry and assistance Some of the local food pantries and distribution centers to call for free food and groceries include The [employer] of [place] (phone [phone]), The [facility] (phone [phone]) and also [employer]. Dial  (336) O8691338.  Several other food banks in the region provide clothing, free food and meals, access to soup kitchens and other help. Find the addresses and phone numbers of more food pantries in [place]. http://www.needhelppayingbills.com/html/[place]_assistance_pro.html   Social Connections   PACE (Adult Program)  - Address: [address] [place], [place] [address] - General office #:  [phone] - Enrollment Phone #: [phone]  Institute of Aging  - Senior Friendship Line: call toll free, available 24 hours a day, at [phone]   [place] [address]  [place] 2-1-1 is another useful way to locate resources in the community. Visit ShedSizes.ch to find service information online. If you need additional assistance, 2-1-1 Referral Specialists are available 24 hours a day, every day by dialing 2-1-1 or [phone] from any phone. The call is free, confidential, and available in any language.  -Senior Resources of [place]: [phone] / [address], [place], [place] [address]  -Dial  988: Talk lifeline 24/7.  -[place]  - Promise Resource Network Warmline: [phone]   Vascular and Vein Specialists of [facility]  Discharge instructions  Lower Extremity Amputation  Please refer to the following instruction for your post-procedure care. Your surgeon or physician assistant will discuss any changes with you.  Activity  You are encouraged to walk as much as you can. You can slowly return to normal activities during the month after your surgery. Avoid strenuous activity and heavy lifting until your doctor tells you it's OK. Avoid activities such as vacuuming or swinging a golf club. Do not drive until your doctor give the OK and you are no longer taking prescription pain medications. It is also normal to have difficulty with sleep habits, eating and bowel movement after surgery. These will go away with time.  Bathing/Showering  Shower daily after you go home. Do not soak in a bathtub, hot tub, or swim until the incision heals completely.  Incision Care  Clean your incision with mild soap and water . Shower every day. Pat the area dry with a clean towel. You do not need a bandage unless otherwise instructed. Do not apply any ointments or creams to your incision. If you have open wounds you will be instructed how to care for them or a visiting nurse may be arranged for you. If you have staples or sutures along your incision they will be removed at your post-op appointment. You may have skin glue on your incision. Do not peel it off. It will come off on its own in about one week.  Diet  Resume your normal diet. There are no special food restrictions following this procedure. A low fat/ low cholesterol diet is recommended for all patients with vascular disease. In order to heal from your surgery, it is CRITICAL to get adequate nutrition. Your body requires vitamins, minerals, and  protein. Vegetables are the best source of vitamins and minerals. Vegetables also provide the perfect balance of protein. Processed food has little nutritional value, so try to  avoid this.  Medications  Resume taking all your medications unless your doctor or physician assistant tells you not to. If your incision is causing pain, you may take over-the-counter pain relievers such as acetaminophen  (Tylenol ). If you were prescribed a stronger pain medication, please aware these medication can cause nausea and constipation. Prevent nausea by taking the medication with a snack or meal. Avoid constipation by drinking plenty of fluids and eating foods with high amount of fiber, such as fruits, vegetables, and grains. Take Colace 100 mg (an over-the-counter stool softener) twice a day as needed for constipation.  Do not take Tylenol  if you are taking prescription pain medications.  Follow Up  Our office will schedule a follow up appointment 4 weeks following discharge.  Please call us  immediately for any of the following conditions  Increase pain, redness, warmth, or drainage (pus) from your incision site(s) Fever of 101 degree or higher The swelling in your leg with the amputation suddenly worsens and becomes more painful than when you were in the hospital  Leg swelling is common after amputation surgery.  The swelling should improve over a few months following surgery. To improve the swelling, you may elevate your legs above the level of your heart while you are sitting or resting. Your surgeon or physician assistant may ask you to apply an ACE wrap or wear compression (TED) stockings to help to reduce swelling.  Reduce your risk of vascular disease  Stop smoking. If you would like help call [facility] at 1-800-QUIT-NOW (978-549-1148) or [place] at 260-399-0775.  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 At this time I feel comfortable discharging you to a skilled nursing facility for further rehabilitation. Thank you for allowing us  to be part of your care.   Please arrange a follow up with the [facility] once you are discharged from the nursing facility.   Please note these changes made to your medications:   Please start taking dilaudid  4mg  every 4 hours as needed for pain for the next 5 days  Your dressing will need to be changed with dry ACE dressing daily as needed.   Please make sure to call to make an appointment with your PCP when you have been discharged from your nursing facility  Please call our clinic if you have any questions or concerns, we may be able to help and keep you from a long and expensive emergency room wait. Our clinic and after hours phone number is [phone], the best time to call is Monday through Friday 9 am to 4 pm but there is always someone available 24/7 if you have an emergency. If you need medication refills please notify your pharmacy one week in advance and they will send us  a request.

## 2024-04-07 NOTE — Plan of Care (Signed)
  Problem: Pain Managment: Goal: General experience of comfort will improve and/or be controlled Outcome: Progressing   Problem: Safety: Goal: Ability to remain free from injury will improve Outcome: Progressing

## 2024-04-07 NOTE — Plan of Care (Signed)
  Problem: Education: Goal: Knowledge of General Education information will improve Description: Including pain rating scale, medication(s)/side effects and non-pharmacologic comfort measures Outcome: Progressing   Problem: Nutrition: Goal: Adequate nutrition will be maintained Outcome: Progressing   Problem: Pain Managment: Goal: General experience of comfort will improve and/or be controlled Outcome: Progressing   Problem: Safety: Goal: Ability to remain free from injury will improve Outcome: Progressing   Problem: Skin Integrity: Goal: Risk for impaired skin integrity will decrease Outcome: Progressing

## 2024-04-08 ENCOUNTER — Other Ambulatory Visit: Payer: Self-pay

## 2024-04-08 DIAGNOSIS — E1169 Type 2 diabetes mellitus with other specified complication: Secondary | ICD-10-CM | POA: Diagnosis not present

## 2024-04-08 DIAGNOSIS — M86171 Other acute osteomyelitis, right ankle and foot: Secondary | ICD-10-CM | POA: Diagnosis not present

## 2024-04-08 LAB — CBC
HCT: 32.8 % — ABNORMAL LOW (ref 36.0–46.0)
Hemoglobin: 10.5 g/dL — ABNORMAL LOW (ref 12.0–15.0)
MCH: 29.8 pg (ref 26.0–34.0)
MCHC: 32 g/dL (ref 30.0–36.0)
MCV: 93.2 fL (ref 80.0–100.0)
Platelets: 268 10*3/uL (ref 150–400)
RBC: 3.52 MIL/uL — ABNORMAL LOW (ref 3.87–5.11)
RDW: 13.9 % (ref 11.5–15.5)
WBC: 8.3 10*3/uL (ref 4.0–10.5)
nRBC: 0 % (ref 0.0–0.2)

## 2024-04-08 LAB — GLUCOSE, CAPILLARY
Glucose-Capillary: 179 mg/dL — ABNORMAL HIGH (ref 70–99)
Glucose-Capillary: 227 mg/dL — ABNORMAL HIGH (ref 70–99)
Glucose-Capillary: 266 mg/dL — ABNORMAL HIGH (ref 70–99)
Glucose-Capillary: 264 mg/dL — ABNORMAL HIGH (ref 70–99)

## 2024-04-08 MED ORDER — HYDROMORPHONE HCL 1 MG/ML IJ SOLN
0.5000 mg | INTRAMUSCULAR | Status: DC | PRN
  Administered 2024-04-09 – 2024-04-13 (×4): 0.5 mg via INTRAVENOUS
  Filled 2024-04-08 (×4): qty 0.5

## 2024-04-08 MED ORDER — INSULIN ASPART 100 UNIT/ML IJ SOLN
0.0000 [IU] | Freq: Every day | INTRAMUSCULAR | Status: DC
  Administered 2024-04-08: 3 [IU] via SUBCUTANEOUS
  Administered 2024-04-10: 2 [IU] via SUBCUTANEOUS
  Administered 2024-04-12: 3 [IU] via SUBCUTANEOUS
  Administered 2024-04-13: 5 [IU] via SUBCUTANEOUS
  Administered 2024-04-14: 3 [IU] via SUBCUTANEOUS

## 2024-04-08 MED ORDER — HYDROMORPHONE HCL 1 MG/ML IJ SOLN: 1.0000 mg | INTRAMUSCULAR | Status: DC | PRN

## 2024-04-08 MED ORDER — FLUCONAZOLE 150 MG PO TABS
150.0000 mg | ORAL_TABLET | Freq: Once | ORAL | Status: AC
  Administered 2024-04-08: 150 mg via ORAL
  Filled 2024-04-08: qty 1

## 2024-04-08 MED ORDER — INSULIN ASPART 100 UNIT/ML IJ SOLN
3.0000 [IU] | Freq: Three times a day (TID) | INTRAMUSCULAR | Status: DC
  Administered 2024-04-09: 3 [IU] via SUBCUTANEOUS

## 2024-04-08 MED ORDER — INSULIN GLARGINE-YFGN 100 UNIT/ML ~~LOC~~ SOLN
35.0000 [IU] | Freq: Every day | SUBCUTANEOUS | Status: DC
  Administered 2024-04-08 – 2024-04-09 (×2): 35 [IU] via SUBCUTANEOUS
  Filled 2024-04-08 (×2): qty 0.35

## 2024-04-08 MED ORDER — INSULIN ASPART 100 UNIT/ML IJ SOLN
0.0000 [IU] | Freq: Three times a day (TID) | INTRAMUSCULAR | Status: DC
  Administered 2024-04-08: 8 [IU] via SUBCUTANEOUS
  Administered 2024-04-08: 5 [IU] via SUBCUTANEOUS
  Administered 2024-04-09: 8 [IU] via SUBCUTANEOUS
  Administered 2024-04-09 (×2): 3 [IU] via SUBCUTANEOUS
  Administered 2024-04-10 (×2): 5 [IU] via SUBCUTANEOUS
  Administered 2024-04-10: 2 [IU] via SUBCUTANEOUS
  Administered 2024-04-11 – 2024-04-12 (×3): 3 [IU] via SUBCUTANEOUS
  Administered 2024-04-12 (×2): 8 [IU] via SUBCUTANEOUS
  Administered 2024-04-13 (×3): 5 [IU] via SUBCUTANEOUS
  Administered 2024-04-14 (×3): 3 [IU] via SUBCUTANEOUS
  Administered 2024-04-15: 2 [IU] via SUBCUTANEOUS
  Administered 2024-04-15: 5 [IU] via SUBCUTANEOUS
  Administered 2024-04-15: 2 [IU] via SUBCUTANEOUS
  Administered 2024-04-16 – 2024-04-17 (×4): 3 [IU] via SUBCUTANEOUS
  Administered 2024-04-17: 2 [IU] via SUBCUTANEOUS
  Administered 2024-04-17: 5 [IU] via SUBCUTANEOUS
  Administered 2024-04-18: 2 [IU] via SUBCUTANEOUS
  Administered 2024-04-18: 5 [IU] via SUBCUTANEOUS
  Administered 2024-04-18: 8 [IU] via SUBCUTANEOUS

## 2024-04-08 NOTE — Plan of Care (Signed)
°  Problem: Education: °Goal: Knowledge of General Education information will improve °Description: Including pain rating scale, medication(s)/side effects and non-pharmacologic comfort measures °Outcome: Progressing °  °Problem: Activity: °Goal: Risk for activity intolerance will decrease °Outcome: Progressing °  °Problem: Nutrition: °Goal: Adequate nutrition will be maintained °Outcome: Progressing °  °Problem: Coping: °Goal: Level of anxiety will decrease °Outcome: Progressing °  °Problem: Safety: °Goal: Ability to remain free from injury will improve °Outcome: Progressing °  °Problem: Skin Integrity: °Goal: Risk for impaired skin integrity will decrease °Outcome: Progressing °  °

## 2024-04-08 NOTE — Progress Notes (Signed)
                  Subjective:   Summary: 63 yo F with PMH of insulin  dependent T2DM, COPD, CKD 2, L BKA in 2014, R TMA in 2024, resection of R fifth metatarsal on 4/8 due to osteomyelitis, readmitted for sepsis 2/2 post-op wound infection and osteomyelitis.   Patient is feeling very depressed today about her upcoming amputation. Expressed concerns about her decreased level of dependence and reliance on other members of her family who are also in poor health. No new medical issues today.   Objective:  Vital signs in last 24 hours: Vitals:   04/07/24 1945 04/08/24 0508 04/08/24 0756 04/08/24 0843  BP: (!) 130/57 (!) 163/58 (!) 127/56   Pulse: 87 80 78   Resp:   17   Temp: 98.8 F (37.1 C) 98.5 F (36.9 C) 97.8 F (36.6 C)   TempSrc: Oral     SpO2: 95% 90% 97% 97%  Weight:      Height:       Supplemental O2: Room Air SpO2: 97 %  Physical Exam:  Constitutional: nontoxic, uncomfortable appearing Pulmonary/Chest: normal work of breathing, no respiratory distress Neuro: A&O x 3 Extremities: R foot with clean dry bandage  Assessment/Plan:   Post-op R foot wound infection and osteomyelitis R fifth metatarsal osteomyelitis s/p resection 4/8 Sepsis, resolved Patient has been evaluated by podiatry and vascular surgery who agree on the need for BKA. Patient is amenable to the plan, though understandably extremely concerned about her quality of life going forward. Currently medically stable, afebrile with stable vital signs and resolved leukocytosis.  - Plan for R BKA on 4/21 by VVS - Unasyn  and vancomycin , day 4 abx coverage - Blood cultures with NGTD - Scheduled Tylenol  1000mg  q8h, oxycodone  10mg  q4h prn, pregabalin  200mg  TID  T2DM Home regimen includes 65 units Degludec, metformin  500mg  daily, semaglutide  1mg . Blood sugar has been elevated at mid 200s to low 300s.  - Increase Semglee  to 35 units daily, SSI TID and at bedtime - Inpatient diabetes coordinator following, appreciate  recommendations  HTN Blood pressure continues to be at goal. Continue holding home antihypertensives  HLD, PAD: continue home rosuvastatin  20mg  Chronic back pain: continue home pregabalin  200mg  TID COPD: continue home Anoro Ellipta  Depression: continue home duloxetine  60mg  GERD: continue home pantoprazole  40mg  CKD2: stable, monitor with morning labs Urinary incontinence: continue home Myrbetriq   Diet: HH/Carb modified VTE: Lovenox  Code: Full  Dispo: Anticipated discharge to home pending medical stability.   Jayson Michael, MD PGY-1 Internal Medicine Resident Pager Number [phone] Please contact the on call pager after 5 pm and on weekends at [phone].

## 2024-04-08 NOTE — Plan of Care (Signed)

## 2024-04-08 NOTE — Progress Notes (Addendum)
  Progress Note    04/08/2024 8:46 AM * No surgery found *  Subjective: Says her right foot still hurts    Vitals:   04/08/24 0756 04/08/24 0843  BP: (!) 127/56   Pulse: 78   Resp: 17   Temp: 97.8 F (36.6 C)   SpO2: 97% 97%    Physical Exam: General: Resting Lungs: Nonlabored Extremities: Right foot bandaged and dry  CBC    Component Value Date/Time   WBC 8.3 04/08/2024 0321   RBC 3.52 (L) 04/08/2024 0321   HGB 10.5 (L) 04/08/2024 0321   HGB 12.2 09/01/2022 1000   HCT 32.8 (L) 04/08/2024 0321   HCT 38.5 09/01/2022 1000   PLT 268 04/08/2024 0321   PLT 175 09/01/2022 1000   MCV 93.2 04/08/2024 0321   MCV 93 09/01/2022 1000   MCH 29.8 04/08/2024 0321   MCHC 32.0 04/08/2024 0321   RDW 13.9 04/08/2024 0321   RDW 14.4 09/01/2022 1000   LYMPHSABS 2.6 04/05/2024 2203   LYMPHSABS 2.7 09/01/2022 1000   MONOABS 0.9 04/05/2024 2203   EOSABS 0.1 04/05/2024 2203   EOSABS 0.1 09/01/2022 1000   BASOSABS 0.1 04/05/2024 2203   BASOSABS 0.1 09/01/2022 1000    BMET    Component Value Date/Time   NA 134 (L) 04/07/2024 0603   NA 141 02/18/2024 0932   K 4.1 04/07/2024 0603   CL 100 04/07/2024 0603   CO2 23 04/07/2024 0603   GLUCOSE 179 (H) 04/07/2024 0603   BUN 7 (L) 04/07/2024 0603   BUN 10 02/18/2024 0932   CREATININE 0.86 04/07/2024 0603   CREATININE 0.68 01/31/2015 1641   CALCIUM  8.4 (L) 04/07/2024 0603   GFRNONAA >60 04/07/2024 0603   GFRNONAA >89 01/31/2015 1641   GFRAA 54 (L) 10/25/2020 1131   GFRAA >89 01/31/2015 1641    INR    Component Value Date/Time   INR 1.3 (H) 04/05/2024 2203     Intake/Output Summary (Last 24 hours) at 04/08/2024 0846 Last data filed at 04/08/2024 0507 Gross per 24 hour  Intake 859.33 ml  Output 1700 ml  Net -840.67 ml      Assessment/Plan:  64 y.o. female with right TMA infection  -The patient reports continued pain in the right foot, same as yesterday -We have discussed with the patient that she is maximally  revascularized on the right.  Her only option at this point is to convert her TMA to a BKA -She is agreeable for right BKA on Monday -Continue IV antibiotics   McKenzi Schuh, PA-C [facility] [phone] 04/08/2024 8:46 AM    VASCULAR STAFF ADDENDUM: I have independently interviewed and examined the patient. I agree with the above.  BKA early next week  Philipp Brawn MD [facility] Phone Number: [phone] 04/08/2024 9:13 AM

## 2024-04-08 NOTE — TOC Progression Note (Signed)
 [facility]) - Progression Note    Patient Details  Name: Jennifer [name] MRN: [id number] Date of Birth: [birth year]  [facility] CM/SW Contact  Lauraine FORBES Saa, LCSW Phone Number: 04/08/2024, 2:05 PM  Clinical Narrative:     2:05 PM CSW added SDOH resources (social connections) to patient's AVS.  Expected Discharge Plan:  (await post op PT/OT recommendations) Barriers to Discharge: Continued Medical Work up  Expected Discharge Plan and Services   Discharge Planning Services: CM Consult Post Acute Care Choice:  (await post op PT/OT recommendations) Living arrangements for the past 2 months: Single Family Home                 DME Arranged:  (await post op PT/OT recommendations)         HH Arranged:  (await post op PT/OT recommendations)           Social Determinants of Health (SDOH) Interventions SDOH Screenings   Food Insecurity: No Food Insecurity (04/06/2024)  Housing: Low Risk  (04/06/2024)  Transportation Needs: No Transportation Needs (04/06/2024)  Recent Concern: Transportation Needs - Unmet Transportation Needs (03/24/2024)  Utilities: Not At Risk (04/06/2024)  Recent Concern: Utilities - At Risk (03/24/2024)  Alcohol Screen: Low Risk  (03/31/2023)  Depression (PHQ2-9): High Risk (10/08/2023)  Financial Resource Strain: High Risk (03/31/2023)  Physical Activity: Inactive (03/31/2023)  Social Connections: Socially Isolated (04/06/2024)  Stress: Stress Concern Present (03/31/2023)  Tobacco Use: High Risk (04/05/2024)    Readmission Risk Interventions    04/07/2024   12:00 PM 03/29/2024    1:06 PM 10/27/2021   10:18 AM  Readmission Risk Prevention Plan  Transportation Screening Complete Complete Complete  PCP or Specialist Appt within 3-5 Days   Complete  HRI or Home Care Consult  Complete Complete  Social Work Consult for Recovery Care Planning/Counseling  Complete Complete  Palliative Care Screening  Not Applicable Not Applicable  Medication  Review (RN Care Manager) Referral to Pharmacy Referral to Pharmacy Complete  PCP or Specialist appointment within 3-5 days of discharge Complete    HRI or Home Care Consult Complete    SW Recovery Care/Counseling Consult Complete

## 2024-04-08 NOTE — Care Management Important Message (Signed)
 Important Message  Patient Details  Name: Jennifer [name] MRN: [id number] Date of Birth: 27-Jan-1961   Important Message Given:  Yes - Medicare IM     Felix Host 04/08/2024, 4:41 PM

## 2024-04-08 NOTE — Evaluation (Signed)
 Physical Therapy Evaluation Patient Details Name: Jennifer [name] MRN: [id number] DOB: [birth year] Today's Date: 04/08/2024  History of Present Illness  63 yo female presents to [facility] on 4/15 with severe R foot pain. 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 Plan for BKA 4/21. Pt recently d/c from acute setting 4/10 s/p debridement of R 5th metatarsal resection, vascular surgery. 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  Clinical Impression   Pt presents with R foot pain, impaired balance, generalized weakness, and impaired activity tolerance. Pt to benefit from acute PT to address deficits. Pt moved to/from EOB with supervision for safety, does require cues for safety and use of bedrails. Plan for BKA on 4/21, pt insistent on d/c home s/p BKA. PT to progress mobility as tolerated, and will continue to follow acutely.          If plan is discharge home, recommend the following: A little help with bathing/dressing/bathroom;Assistance with cooking/housework;Assist for transportation;Help with stairs or ramp for entrance;A lot of help with walking and/or transfers   Can travel by private vehicle        Equipment Recommendations Wheelchair (measurements PT);Wheelchair cushion (measurements PT);Other (comment) (tub bench, also asking for power w/c to be serviced)  Recommendations for Other Services       Functional Status Assessment Patient has had a recent decline in their functional status and demonstrates the ability to make significant improvements in function in a reasonable and predictable amount of time.     Precautions / Restrictions Precautions Precautions: Fall Restrictions Weight Bearing Restrictions Per Provider Order: Yes RLE Weight Bearing Per Provider Order: Non weight bearing Other Position/Activity Restrictions:  NWB RLE until BKA      Mobility  Bed Mobility Overal bed mobility: Needs Assistance Bed Mobility: Supine to Sit, Sit to Supine     Supine to sit: Supervision Sit to supine: Supervision   General bed mobility comments: for safety, increased time and cues for sequencing    Transfers                   General transfer comment: nt    Ambulation/Gait                  Stairs            Wheelchair Mobility     Tilt Bed    Modified Rankin (Stroke Patients Only)       Balance Overall balance assessment: Needs assistance Sitting-balance support: Feet supported, No upper extremity supported Sitting balance-Leahy Scale: Good     Standing balance support: Bilateral upper extremity supported, Single extremity supported, During functional activity Standing balance-Leahy Scale: Poor                               Pertinent Vitals/Pain Pain Assessment Pain Assessment: Faces Faces Pain Scale: Hurts little more Pain Location: R foot Pain Descriptors / Indicators: Grimacing, Guarding, Sore Pain Intervention(s): Limited activity within patient's tolerance, Monitored during session, Repositioned    Home Living Family/patient expects to be discharged to:: Private residence Living Arrangements: Children;Other relatives Available Help at Discharge: Family;Available 24 hours/day Type of Home: House Home Access: Ramped entrance       Home Layout: One level Home Equipment: Rollator (4 wheels);Tub bench;Wheelchair - power Additional Comments: mostly uses Rollator in  home due to lack of room for power wheelchair, use wheelchair for mobility outside the home. power w/c not working at the present    Prior Function Prior Level of Function : Needs assist       Physical Assist : Mobility (physical)     Mobility Comments: uses Rollator and prosthetic to get around her home, electric wheelchair in community, father provides transportation ADLs  Comments: son assists with meal prep, pt does her own bathing, dressing and toileting     Extremity/Trunk Assessment   Upper Extremity Assessment Upper Extremity Assessment: Defer to OT evaluation    Lower Extremity Assessment Lower Extremity Assessment: Generalized weakness RLE Deficits / Details: non-healing  wounds on pretibial surface RLE Sensation: history of peripheral neuropathy LLE Deficits / Details: L BKA, WFL hip and knee ROM and strength. Calloused area along lateral aspect of lower leg suspect due to prosthetic use    Cervical / Trunk Assessment Cervical / Trunk Assessment: Kyphotic  Communication   Communication Communication: No apparent difficulties    Cognition Arousal: Alert Behavior During Therapy: WFL for tasks assessed/performed                             Following commands: Intact       Cueing Cueing Techniques: Verbal cues, Tactile cues, Visual cues     General Comments      Exercises     Assessment/Plan    PT Assessment Patient needs continued PT services  PT Problem List Decreased strength;Decreased range of motion;Decreased activity tolerance;Decreased balance;Decreased mobility;Impaired sensation;Decreased skin integrity;Pain       PT Treatment Interventions      PT Goals (Current goals can be found in the Care Plan section)  Acute Rehab PT Goals PT Goal Formulation: With patient Time For Goal Achievement: 04/22/24 Potential to Achieve Goals: Fair    Frequency Min 2X/week     Co-evaluation               AM-PAC PT "6 Clicks" Mobility  Outcome Measure Help needed turning from your back to your side while in a flat bed without using bedrails?: A Little Help needed moving from lying on your back to sitting on the side of a flat bed without using bedrails?: A Little Help needed moving to and from a bed to a chair (including a wheelchair)?: A Lot Help needed standing up from a chair using your arms (e.g.,  wheelchair or bedside chair)?: A Lot Help needed to walk in hospital room?: Total Help needed climbing 3-5 steps with a railing? : Total 6 Click Score: 12    End of Session   Activity Tolerance: Patient tolerated treatment well Patient left: with call bell/phone within reach;in bed;with bed alarm set;Other (comment) (sitting EOB eating lunch) Nurse Communication: Mobility status PT Visit Diagnosis: Unsteadiness on feet (R26.81);Other abnormalities of gait and mobility (R26.89);Muscle weakness (generalized) (M62.81);Difficulty in walking, not elsewhere classified (R26.2);Pain Pain - Right/Left: Right Pain - part of body: Ankle and joints of foot    Time: 1441-1455 PT Time Calculation (min) (ACUTE ONLY): 14 min   Charges:   PT Evaluation $PT Eval Low Complexity: 1 Low   PT General Charges $$ ACUTE PT VISIT: 1 Visit         Shirlene Doughty, PT DPT Acute Rehabilitation Services Secure Chat Preferred  Office [phone]   Levin Dagostino Cydney Draft 04/08/2024, 4:34 PM

## 2024-04-09 DIAGNOSIS — E1169 Type 2 diabetes mellitus with other specified complication: Secondary | ICD-10-CM | POA: Diagnosis not present

## 2024-04-09 DIAGNOSIS — M86171 Other acute osteomyelitis, right ankle and foot: Secondary | ICD-10-CM | POA: Diagnosis not present

## 2024-04-09 LAB — GLUCOSE, CAPILLARY
Glucose-Capillary: 260 mg/dL — ABNORMAL HIGH (ref 70–99)
Glucose-Capillary: 172 mg/dL — ABNORMAL HIGH (ref 70–99)
Glucose-Capillary: 157 mg/dL — ABNORMAL HIGH (ref 70–99)
Glucose-Capillary: 143 mg/dL — ABNORMAL HIGH (ref 70–99)

## 2024-04-09 MED ORDER — INSULIN GLARGINE-YFGN 100 UNIT/ML ~~LOC~~ SOLN
40.0000 [IU] | Freq: Every day | SUBCUTANEOUS | Status: DC
  Administered 2024-04-10: 40 [IU] via SUBCUTANEOUS
  Filled 2024-04-09: qty 0.4

## 2024-04-09 MED ORDER — VANCOMYCIN HCL 1.25 G IV SOLR
1250.0000 mg | INTRAVENOUS | Status: DC
  Administered 2024-04-09 – 2024-04-11 (×3): 1250 mg via INTRAVENOUS
  Filled 2024-04-09 (×4): qty 25

## 2024-04-09 MED ORDER — INSULIN ASPART 100 UNIT/ML IJ SOLN
5.0000 [IU] | Freq: Three times a day (TID) | INTRAMUSCULAR | Status: DC
  Administered 2024-04-09 – 2024-04-13 (×11): 5 [IU] via SUBCUTANEOUS

## 2024-04-09 MED ORDER — ENOXAPARIN SODIUM 60 MG/0.6ML IJ SOSY
50.0000 mg | PREFILLED_SYRINGE | INTRAMUSCULAR | Status: DC
  Administered 2024-04-10 – 2024-04-18 (×8): 50 mg via SUBCUTANEOUS
  Filled 2024-04-09 (×9): qty 0.6

## 2024-04-09 MED ORDER — AMLODIPINE BESYLATE 10 MG PO TABS
10.0000 mg | ORAL_TABLET | Freq: Every day | ORAL | Status: DC
  Administered 2024-04-09 – 2024-04-18 (×10): 10 mg via ORAL
  Filled 2024-04-09 (×10): qty 1

## 2024-04-09 NOTE — Progress Notes (Signed)
                  Subjective:   Summary: 63 yo F with PMH of insulin  dependent T2DM, COPD, CKD 2, L BKA in 2014, R TMA in 2024, resection of R fifth metatarsal on 4/8 due to osteomyelitis, readmitted for sepsis 2/2 post-op wound infection and osteomyelitis.   Patient is feeling well today.  She appears in higher spirits than she was yesterday.  She is accepting about her upcoming surgery.  Endorses some nausea alleviated by her Reglan , no new issues  Objective:  Vital signs in last 24 hours: Vitals:   04/08/24 2001 04/09/24 0443 04/09/24 0737 04/09/24 0803  BP: (!) 144/66 (!) 143/60 (!) 146/69   Pulse: 84 82 92 (!) 102  Resp:   17 20  Temp: 98.6 F (37 C) 98.3 F (36.8 C) 99 F (37.2 C)   TempSrc: Oral Oral Oral   SpO2: 98% 91% 100% 92%  Weight:      Height:       Supplemental O2: Room Air SpO2: 92 %  Physical Exam:  Constitutional: no acute distress Pulmonary/Chest: normal work of breathing, no respiratory distress Neuro: A&O x 3 Extremities: R foot with clean dry bandage  Assessment/Plan:   63 yo F with L BKA, recurrent R foot osteomyelitis now with recurrent infection requiring R BKA.  Patient is medically stable, we are awaiting surgery by vascular surgery on Monday.  Patient seems to be grimly accepting of the need for amputation.  Post-op R foot wound infection and osteomyelitis R fifth metatarsal osteomyelitis s/p resection 4/8 Sepsis, resolved No changes to the plan today, awaiting BKA by vascular surgery on Monday. Continued discussion with the patient about her change in mobility after discharge. Patient's spirits appear to have improved from yesterday, when she appeared very depressed about the prospects of another amputation. - Plan for R BKA on 4/21 by VVS - Unasyn  and vancomycin , day 5 abx coverage - Blood cultures with NGTD - Scheduled Tylenol  1000mg  q8h, oxycodone  10mg  q4h prn, pregabalin  200mg  TID  T2DM Blood sugar continues to be elevated to the mid  200s, improved from yesterday. - Increase Semglee  to 40 units daily, mealtime novolog  increased to 5 units. Continue moderate SSI with meals and at bedtime  HTN: Normotensive on home amlodipine  10mg  HLD, PAD: continue home rosuvastatin  20mg  Chronic back pain: continue home pregabalin  200mg  TID COPD: continue home Anoro Ellipta  Depression: continue home duloxetine  60mg  GERD: continue home pantoprazole  40mg  CKD2: stable, monitor with morning labs Urinary incontinence: continue home Myrbetriq  Gastroparesis: Reglan  10mg  qid  Diet: HH/Carb modified VTE: Lovenox  Code: Full  Dispo: Anticipated discharge to home vs SNF pending PT evaluation after amputation  Jennifer Michael, MD PGY-1 Internal Medicine Resident Pager Number [phone] Please contact the on call pager after 5 pm and on weekends at [phone].

## 2024-04-09 NOTE — Plan of Care (Signed)
  Problem: Pain Managment: Goal: General experience of comfort will improve and/or be controlled Outcome: Progressing   Problem: Safety: Goal: Ability to remain free from injury will improve Outcome: Progressing   Problem: Skin Integrity: Goal: Risk for impaired skin integrity will decrease Outcome: Progressing

## 2024-04-09 NOTE — Evaluation (Signed)
 Occupational Therapy Evaluation Patient Details Name: Jennifer [name] MRN: [id number] DOB: 02-Apr-1961 Today's Date: 04/09/2024   History of Present Illness   63 yo female presents to [facility] on 4/15 with severe R foot pain. 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 Plan for BKA 4/21. Pt recently d/c from acute setting 4/10 s/p debridement of R 5th metatarsal resection, vascular surgery. 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     Clinical Impressions Pt c/o pain 11/10 to RLE, no grimacing, did not affect participation, able to fully participate. Pt lives with son who can assist as needed, PLOF mod I with LLE prosthetic using Rollator. Pt currently NWB to RLE and has R BKA planned 4/21 in 2 days. Pt demos good overall strength, understands precautions, A/O x4, supervision for safety for bed mobility and laterally scooting at EOB. Set up for most ADLs, a little help for LB dressing for RLE. At this time, will see Pt after planned R BKA 4/21 to reassess needs and set DC plan and DME needs.      If plan is discharge home, recommend the following:   A little help with walking and/or transfers;A little help with bathing/dressing/bathroom;Assistance with cooking/housework     Functional Status Assessment   Patient has had a recent decline in their functional status and demonstrates the ability to make significant improvements in function in a reasonable and predictable amount of time.     Equipment Recommendations   Other (comment) (TBD)     Recommendations for Other Services         Precautions/Restrictions   Precautions Precautions: Fall Restrictions Weight Bearing Restrictions Per Provider Order: Yes RLE Weight Bearing Per Provider Order: Non weight bearing     Mobility Bed Mobility Overal bed mobility: Needs  Assistance Bed Mobility: Supine to Sit, Sit to Supine     Supine to sit: Supervision Sit to supine: Supervision   General bed mobility comments: supervision for safety    Transfers Overall transfer level: Needs assistance                 General transfer comment: able to laterally scoot at bedside      Balance Overall balance assessment: Needs assistance Sitting-balance support: No upper extremity supported, Feet supported Sitting balance-Leahy Scale: Good                                     ADL either performed or assessed with clinical judgement   ADL Overall ADL's : Needs assistance/impaired Eating/Feeding: Independent   Grooming: Set up;Sitting   Upper Body Bathing: Set up;Sitting   Lower Body Bathing: Set up;Sitting/lateral leans   Upper Body Dressing : Set up;Sitting   Lower Body Dressing: Minimal assistance;Set up;Sitting/lateral leans                 General ADL Comments: Pt set up/sitting for grooming/UB dressing, min A for LB dressing, did not attempt transfer due to L BKA, NWB to RLE currently, planned R BKA 04/11/24. Pt able to laterally scoot sitting EOB. good overall strength     Vision Baseline Vision/History: 1 Wears glasses Ability to See in Adequate Light: 0 Adequate Patient Visual Report: No change from baseline       Perception  Praxis         Pertinent Vitals/Pain Pain Assessment Pain Assessment: 0-10 Pain Score: 10-Worst pain ever Pain Location: R foot Pain Descriptors / Indicators: Grimacing, Guarding, Sore Pain Intervention(s): Monitored during session     Extremity/Trunk Assessment Upper Extremity Assessment Upper Extremity Assessment: Overall WFL for tasks assessed;RUE deficits/detail;LUE deficits/detail RUE Deficits / Details: grossly WFLs, reports RA, hands get stiff and cramped at times RUE Sensation: history of peripheral neuropathy LUE Deficits / Details: grossly WFLs, reports RA,  hands get stiff and cramped at times LUE Sensation: history of peripheral neuropathy           Communication Communication Communication: No apparent difficulties   Cognition Arousal: Alert Behavior During Therapy: WFL for tasks assessed/performed Cognition: No apparent impairments                               Following commands: Intact       Cueing  General Comments   Cueing Techniques: Verbal cues;Tactile cues;Visual cues      Exercises     Shoulder Instructions      Home Living Family/patient expects to be discharged to:: Private residence Living Arrangements: Children;Other relatives Available Help at Discharge: Family;Available 24 hours/day Type of Home: House Home Access: Ramped entrance     Home Layout: One level     Bathroom Shower/Tub: Chief Strategy Officer: Standard Bathroom Accessibility: Yes   Home Equipment: Rollator (4 wheels);Tub bench;Wheelchair - power   Additional Comments: . Pt lives with son who can help as needed. mostly uses Rollator in home due to lack of room for power wheelchair, use wheelchair for mobility outside the home. power w/c not working at the present      Prior Functioning/Environment Prior Level of Function : Needs assist             Mobility Comments: uses Rollator and prosthetic to get around her home, electric wheelchair in community, father provides transportation ADLs Comments: son assists with meal prep, pt does her own bathing, dressing and toileting    OT Problem List: Impaired balance (sitting and/or standing);Decreased activity tolerance;Decreased strength;Pain   OT Treatment/Interventions: Self-care/ADL training;Therapeutic exercise;Energy conservation;DME and/or AE instruction;Therapeutic activities;Patient/family education;Balance training      OT Goals(Current goals can be found in the care plan section)   Acute Rehab OT Goals Patient Stated Goal: to manage pain OT  Goal Formulation: With patient Time For Goal Achievement: 04/23/24 Potential to Achieve Goals: Good   OT Frequency:  Min 2X/week    Co-evaluation              AM-PAC OT "6 Clicks" Daily Activity     Outcome Measure Help from another person eating meals?: None Help from another person taking care of personal grooming?: A Little Help from another person toileting, which includes using toliet, bedpan, or urinal?: A Little Help from another person bathing (including washing, rinsing, drying)?: A Little Help from another person to put on and taking off regular upper body clothing?: A Little Help from another person to put on and taking off regular lower body clothing?: A Little 6 Click Score: 19   End of Session Nurse Communication: Mobility status  Activity Tolerance: Patient tolerated treatment well Patient left: in bed;with call bell/phone within reach;with nursing/sitter in room  OT Visit Diagnosis: Other abnormalities of gait and mobility (R26.89);Muscle weakness (generalized) (M62.81);Pain Pain - Right/Left: Right Pain - part of body: Ankle  and joints of foot                Time: 1478-2956 OT Time Calculation (min): 14 min Charges:  OT General Charges $OT Visit: 1 Visit OT Evaluation $OT Eval Moderate Complexity: 1 [address] OTR/L   Scherry Curtis 04/09/2024, 2:23 PM

## 2024-04-10 DIAGNOSIS — M86171 Other acute osteomyelitis, right ankle and foot: Secondary | ICD-10-CM | POA: Diagnosis not present

## 2024-04-10 DIAGNOSIS — T8781 Dehiscence of amputation stump: Secondary | ICD-10-CM | POA: Diagnosis not present

## 2024-04-10 DIAGNOSIS — E1169 Type 2 diabetes mellitus with other specified complication: Secondary | ICD-10-CM | POA: Diagnosis not present

## 2024-04-10 LAB — CULTURE, BLOOD (ROUTINE X 2)
Culture: NO GROWTH
Culture: NO GROWTH

## 2024-04-10 LAB — GLUCOSE, CAPILLARY
Glucose-Capillary: 207 mg/dL — ABNORMAL HIGH (ref 70–99)
Glucose-Capillary: 225 mg/dL — ABNORMAL HIGH (ref 70–99)
Glucose-Capillary: 145 mg/dL — ABNORMAL HIGH (ref 70–99)
Glucose-Capillary: 233 mg/dL — ABNORMAL HIGH (ref 70–99)

## 2024-04-10 MED ORDER — INSULIN GLARGINE-YFGN 100 UNIT/ML ~~LOC~~ SOLN
25.0000 [IU] | Freq: Every day | SUBCUTANEOUS | Status: DC
  Administered 2024-04-11: 25 [IU] via SUBCUTANEOUS
  Filled 2024-04-10: qty 0.25

## 2024-04-10 MED ORDER — GUAIFENESIN ER 600 MG PO TB12
600.0000 mg | ORAL_TABLET | Freq: Two times a day (BID) | ORAL | Status: DC
  Administered 2024-04-10 – 2024-04-18 (×17): 600 mg via ORAL
  Filled 2024-04-10 (×17): qty 1

## 2024-04-10 NOTE — Progress Notes (Signed)
                  Subjective:   Summary: 63 yo F with PMH of insulin  dependent T2DM, COPD, CKD 2, L BKA in 2014, R TMA in 2024, resection of R fifth metatarsal on 4/8 due to osteomyelitis, readmitted for sepsis 2/2 post-op wound infection and osteomyelitis.   No changes today.  Patient is nervous about tomorrow but knows that she has to get this operation done to treat her infection.  Still worried about the future with bilateral lower extremity amputations.  Understands that there is counseling and medications for her mood but does not want to start either of these today.  Objective:  Vital signs in last 24 hours: Vitals:   04/09/24 0803 04/09/24 1559 04/09/24 2047 04/10/24 0450  BP:  128/65 (!) 126/50 (!) 127/49  Pulse: (!) 102 77 92 81  Resp: 20 17 17 17   Temp:  98.5 F (36.9 C) 98.5 F (36.9 C) 98.9 F (37.2 C)  TempSrc:  Oral Oral Oral  SpO2: 92% 98% 99% 100%  Weight:      Height:       Supplemental O2: Room Air SpO2: 100 %  Physical Exam:  Constitutional: no acute distress Pulmonary/Chest: normal work of breathing, no respiratory distress Neuro: A&O x 3 Extremities: R foot with clean dry bandage  Assessment/Plan:   63 yo F with L BKA, recurrent R foot osteomyelitis now with recurrent infection requiring R BKA.  Patient is medically stable, we are awaiting surgery by vascular surgery on Monday.  Patient seems to be grimly accepting of the need for amputation.  Post-op R foot wound infection and osteomyelitis R fifth metatarsal osteomyelitis s/p resection 4/8 Sepsis, resolved No changes to the plan today, awaiting BKA by vascular surgery on 4/21.  - Plan for R BKA on 4/21 by VVS, n.p.o. at midnight - Unasyn  and vancomycin , day 5 abx coverage - Blood cultures with NGTD - Scheduled Tylenol  1000mg  q8h, oxycodone  10mg  q4h prn, pregabalin  200mg  TID  T2DM Blood sugar continues to be elevated to the mid 200s, improved from yesterday. - Semglee  to 40 units daily,  mealtime novolog  increased to 5 units. Continue moderate SSI with meals and at bedtime - Decrease Semglee  to 25 units for tomorrow morning while n.p.o.  HTN: Normotensive on home amlodipine  10mg  HLD, PAD: continue home rosuvastatin  20mg  Chronic back pain: continue home pregabalin  200mg  TID COPD: continue home Anoro Ellipta  Depression: continue home duloxetine  60mg  GERD: continue home pantoprazole  40mg  CKD2: stable, monitor with morning labs Urinary incontinence: continue home Myrbetriq  Gastroparesis: Reglan  10mg  qid  Diet: HH/Carb modified VTE: Lovenox  Code: Full  Dispo: Anticipated discharge to home vs SNF pending PT evaluation after amputation  Cleven Dallas, DO Internal Medicine Resident, PGY-2 Please contact the on call pager at [phone] for any urgent or emergent needs. 6:54 AM 04/10/2024

## 2024-04-10 NOTE — Plan of Care (Signed)
 Problem: Education: Goal: Knowledge of General Education information will improve Description: Including pain rating scale, medication(s)/side effects and non-pharmacologic comfort measures Outcome: Progressing   Problem: Health Behavior/Discharge Planning: Goal: Ability to manage health-related needs will improve Outcome: Progressing   Problem: Clinical Measurements: Goal: Ability to maintain clinical measurements within normal limits will improve Outcome: Progressing Goal: Will remain free from infection Outcome: Progressing Goal: Diagnostic test results will improve Outcome: Progressing Goal: Respiratory complications will improve Outcome: Progressing Goal: Cardiovascular complication will be avoided Outcome: Progressing   Problem: Activity: Goal: Risk for activity intolerance will decrease Outcome: Progressing   Problem: Nutrition: Goal: Adequate nutrition will be maintained Outcome: Progressing   Problem: Coping: Goal: Level of anxiety will decrease Outcome: Progressing   Problem: Elimination: Goal: Will not experience complications related to bowel motility Outcome: Progressing Goal: Will not experience complications related to urinary retention Outcome: Progressing   Problem: Pain Managment: Goal: General experience of comfort will improve and/or be controlled Outcome: Progressing   Problem: Safety: Goal: Ability to remain free from injury will improve Outcome: Progressing   Problem: Skin Integrity: Goal: Risk for impaired skin integrity will decrease Outcome: Progressing   Problem: Education: Goal: Ability to describe self-care measures that may prevent or decrease complications (Diabetes Survival Skills Education) will improve Outcome: Progressing Goal: Individualized Educational Video(s) Outcome: Progressing   Problem: Coping: Goal: Ability to adjust to condition or change in health will improve Outcome: Progressing   Problem: Fluid  Volume: Goal: Ability to maintain a balanced intake and output will improve Outcome: Progressing   Problem: Health Behavior/Discharge Planning: Goal: Ability to identify and utilize available resources and services will improve Outcome: Progressing Goal: Ability to manage health-related needs will improve Outcome: Progressing   Problem: Metabolic: Goal: Ability to maintain appropriate glucose levels will improve Outcome: Progressing   Problem: Nutritional: Goal: Maintenance of adequate nutrition will improve Outcome: Progressing Goal: Progress toward achieving an optimal weight will improve Outcome: Progressing   Problem: Skin Integrity: Goal: Risk for impaired skin integrity will decrease Outcome: Progressing   Problem: Tissue Perfusion: Goal: Adequacy of tissue perfusion will improve Outcome: Progressing   Problem: Education: Goal: Knowledge of General Education information will improve Description: Including pain rating scale, medication(s)/side effects and non-pharmacologic comfort measures Outcome: Progressing   Problem: Health Behavior/Discharge Planning: Goal: Ability to manage health-related needs will improve Outcome: Progressing   Problem: Clinical Measurements: Goal: Ability to maintain clinical measurements within normal limits will improve Outcome: Progressing Goal: Will remain free from infection Outcome: Progressing Goal: Diagnostic test results will improve Outcome: Progressing Goal: Respiratory complications will improve Outcome: Progressing Goal: Cardiovascular complication will be avoided Outcome: Progressing   Problem: Activity: Goal: Risk for activity intolerance will decrease Outcome: Progressing   Problem: Nutrition: Goal: Adequate nutrition will be maintained Outcome: Progressing   Problem: Coping: Goal: Level of anxiety will decrease Outcome: Progressing   Problem: Elimination: Goal: Will not experience complications related to  bowel motility Outcome: Progressing Goal: Will not experience complications related to urinary retention Outcome: Progressing   Problem: Pain Managment: Goal: General experience of comfort will improve and/or be controlled Outcome: Progressing   Problem: Safety: Goal: Ability to remain free from injury will improve Outcome: Progressing   Problem: Skin Integrity: Goal: Risk for impaired skin integrity will decrease Outcome: Progressing   Problem: Education: Goal: Ability to describe self-care measures that may prevent or decrease complications (Diabetes Survival Skills Education) will improve Outcome: Progressing Goal: Individualized Educational Video(s) Outcome: Progressing   Problem: Coping: Goal: Ability  to adjust to condition or change in health will improve Outcome: Progressing   Problem: Fluid Volume: Goal: Ability to maintain a balanced intake and output will improve Outcome: Progressing   Problem: Health Behavior/Discharge Planning: Goal: Ability to identify and utilize available resources and services will improve Outcome: Progressing Goal: Ability to manage health-related needs will improve Outcome: Progressing   Problem: Metabolic: Goal: Ability to maintain appropriate glucose levels will improve Outcome: Progressing   Problem: Nutritional: Goal: Maintenance of adequate nutrition will improve Outcome: Progressing Goal: Progress toward achieving an optimal weight will improve Outcome: Progressing   Problem: Skin Integrity: Goal: Risk for impaired skin integrity will decrease Outcome: Progressing   Problem: Tissue Perfusion: Goal: Adequacy of tissue perfusion will improve Outcome: Progressing

## 2024-04-10 NOTE — Progress Notes (Addendum)
  Progress Note    04/10/2024 8:03 AM * No surgery found *  Subjective:  still trying to process getting amputation tomorrow and what that will look like for her moving forward   Vitals:   04/09/24 2047 04/10/24 0450  BP: (!) 126/50 (!) 127/49  Pulse: 92 81  Resp: 17 17  Temp: 98.5 F (36.9 C) 98.9 F (37.2 C)  SpO2: 99% 100%   Physical Exam: Cardiac:  regular Lungs:  non labored Extremities:  right TMA dressed Neurologic: alert and oriented  CBC    Component Value Date/Time   WBC 8.3 04/08/2024 0321   RBC 3.52 (L) 04/08/2024 0321   HGB 10.5 (L) 04/08/2024 0321   HGB 12.2 09/01/2022 1000   HCT 32.8 (L) 04/08/2024 0321   HCT 38.5 09/01/2022 1000   PLT 268 04/08/2024 0321   PLT 175 09/01/2022 1000   MCV 93.2 04/08/2024 0321   MCV 93 09/01/2022 1000   MCH 29.8 04/08/2024 0321   MCHC 32.0 04/08/2024 0321   RDW 13.9 04/08/2024 0321   RDW 14.4 09/01/2022 1000   LYMPHSABS 2.6 04/05/2024 2203   LYMPHSABS 2.7 09/01/2022 1000   MONOABS 0.9 04/05/2024 2203   EOSABS 0.1 04/05/2024 2203   EOSABS 0.1 09/01/2022 1000   BASOSABS 0.1 04/05/2024 2203   BASOSABS 0.1 09/01/2022 1000    BMET    Component Value Date/Time   NA 134 (L) 04/07/2024 0603   NA 141 02/18/2024 0932   K 4.1 04/07/2024 0603   CL 100 04/07/2024 0603   CO2 23 04/07/2024 0603   GLUCOSE 179 (H) 04/07/2024 0603   BUN 7 (L) 04/07/2024 0603   BUN 10 02/18/2024 0932   CREATININE 0.86 04/07/2024 0603   CREATININE 0.68 01/31/2015 1641   CALCIUM  8.4 (L) 04/07/2024 0603   GFRNONAA >60 04/07/2024 0603   GFRNONAA >89 01/31/2015 1641   GFRAA 54 (L) 10/25/2020 1131   GFRAA >89 01/31/2015 1641    INR    Component Value Date/Time   INR 1.3 (H) 04/05/2024 2203     Intake/Output Summary (Last 24 hours) at 04/10/2024 0803 Last data filed at 04/10/2024 0527 Gross per 24 hour  Intake 660 ml  Output 1500 ml  Net -840 ml     Assessment/Plan:  63 y.o. female with right TMA infection  Scheduled for right  below knee amputation tomorrow 4/21 in OR with Dr. Rosalva Comber Answered her questions regarding surgery and she is willing to proceed  NPO after midnight Consent ordered  Deneen Finical, PA-C [facility] [phone] 04/10/2024 8:03 AM  VASCULAR STAFF ADDENDUM: I agree with the above.   Heber Little. Edgardo Goodwill, MD [facility] Phone Number: [phone] 04/10/2024 1:58 PM

## 2024-04-11 ENCOUNTER — Encounter (HOSPITAL_COMMUNITY)
Admission: EM | Disposition: A | Discharge status: Skilled Nursing Facility | Payer: Self-pay | Source: Home / Self Care | Attending: Internal Medicine

## 2024-04-11 ENCOUNTER — Inpatient Hospital Stay (HOSPITAL_COMMUNITY): Admitting: Certified Registered"

## 2024-04-11 ENCOUNTER — Other Ambulatory Visit: Payer: Self-pay

## 2024-04-11 ENCOUNTER — Encounter (HOSPITAL_COMMUNITY): Payer: Self-pay | Admitting: Internal Medicine

## 2024-04-11 DIAGNOSIS — T8781 Dehiscence of amputation stump: Secondary | ICD-10-CM | POA: Diagnosis not present

## 2024-04-11 DIAGNOSIS — E1151 Type 2 diabetes mellitus with diabetic peripheral angiopathy without gangrene: Secondary | ICD-10-CM

## 2024-04-11 DIAGNOSIS — A419 Sepsis, unspecified organism: Secondary | ICD-10-CM

## 2024-04-11 DIAGNOSIS — E1169 Type 2 diabetes mellitus with other specified complication: Secondary | ICD-10-CM | POA: Diagnosis not present

## 2024-04-11 DIAGNOSIS — T8144XA Sepsis following a procedure, initial encounter: Secondary | ICD-10-CM

## 2024-04-11 DIAGNOSIS — M869 Osteomyelitis, unspecified: Secondary | ICD-10-CM

## 2024-04-11 DIAGNOSIS — Z9889 Other specified postprocedural states: Secondary | ICD-10-CM

## 2024-04-11 HISTORY — DX: Sepsis, unspecified organism: A41.9

## 2024-04-11 HISTORY — PX: AMPUTATION: SHX166

## 2024-04-11 LAB — GLUCOSE, CAPILLARY
Glucose-Capillary: 198 mg/dL — ABNORMAL HIGH (ref 70–99)
Glucose-Capillary: 205 mg/dL — ABNORMAL HIGH (ref 70–99)
Glucose-Capillary: 242 mg/dL — ABNORMAL HIGH (ref 70–99)
Glucose-Capillary: 187 mg/dL — ABNORMAL HIGH (ref 70–99)
Glucose-Capillary: 196 mg/dL — ABNORMAL HIGH (ref 70–99)
Glucose-Capillary: 157 mg/dL — ABNORMAL HIGH (ref 70–99)

## 2024-04-11 LAB — AEROBIC/ANAEROBIC CULTURE W GRAM STAIN (SURGICAL/DEEP WOUND): Gram Stain: NONE SEEN

## 2024-04-11 LAB — CBC
HCT: 33.8 % — ABNORMAL LOW (ref 36.0–46.0)
Hemoglobin: 10.8 g/dL — ABNORMAL LOW (ref 12.0–15.0)
MCH: 30.6 pg (ref 26.0–34.0)
MCHC: 32 g/dL (ref 30.0–36.0)
MCV: 95.8 fL (ref 80.0–100.0)
Platelets: 298 10*3/uL (ref 150–400)
RBC: 3.53 MIL/uL — ABNORMAL LOW (ref 3.87–5.11)
RDW: 14.6 % (ref 11.5–15.5)
WBC: 10.1 10*3/uL (ref 4.0–10.5)
nRBC: 0 % (ref 0.0–0.2)

## 2024-04-11 LAB — BASIC METABOLIC PANEL WITH GFR
Anion gap: 5 (ref 5–15)
BUN: 11 mg/dL (ref 8–23)
CO2: 29 mmol/L (ref 22–32)
Calcium: 8.6 mg/dL — ABNORMAL LOW (ref 8.9–10.3)
Chloride: 104 mmol/L (ref 98–111)
Creatinine, Ser: 0.83 mg/dL (ref 0.44–1.00)
GFR, Estimated: >60 mL/min
Glucose, Bld: 213 mg/dL — ABNORMAL HIGH (ref 70–99)
Potassium: 4.7 mmol/L (ref 3.5–5.1)
Sodium: 138 mmol/L (ref 135–145)

## 2024-04-11 LAB — SURGICAL PCR SCREEN
MRSA, PCR: NEGATIVE
Staphylococcus aureus: NEGATIVE

## 2024-04-11 SURGERY — AMPUTATION BELOW KNEE
Anesthesia: Regional | Site: Knee | Laterality: Right

## 2024-04-11 MED ORDER — FENTANYL CITRATE (PF) 250 MCG/5ML IJ SOLN
INTRAMUSCULAR | Status: AC
  Filled 2024-04-11: qty 5

## 2024-04-11 MED ORDER — ONDANSETRON HCL 4 MG/2ML IJ SOLN
INTRAMUSCULAR | Status: AC
  Filled 2024-04-11: qty 2

## 2024-04-11 MED ORDER — HYDRALAZINE HCL 20 MG/ML IJ SOLN: 5.0000 mg | INTRAMUSCULAR | Status: DC | PRN

## 2024-04-11 MED ORDER — 0.9 % SODIUM CHLORIDE (POUR BTL) OPTIME
TOPICAL | Status: DC | PRN
  Administered 2024-04-11: 1000 mL

## 2024-04-11 MED ORDER — POTASSIUM CHLORIDE CRYS ER 20 MEQ PO TBCR: 20.0000 meq | EXTENDED_RELEASE_TABLET | Freq: Every day | ORAL | Status: DC | PRN

## 2024-04-11 MED ORDER — PHENOL 1.4 % MT LIQD: 1.0000 | OROMUCOSAL | Status: DC | PRN

## 2024-04-11 MED ORDER — ROCURONIUM BROMIDE 10 MG/ML (PF) SYRINGE
PREFILLED_SYRINGE | INTRAVENOUS | Status: AC
  Filled 2024-04-11: qty 10

## 2024-04-11 MED ORDER — PROPOFOL 10 MG/ML IV BOLUS
INTRAVENOUS | Status: AC
  Filled 2024-04-11: qty 20

## 2024-04-11 MED ORDER — METOPROLOL TARTRATE 5 MG/5ML IV SOLN: 2.0000 mg | INTRAVENOUS | Status: DC | PRN

## 2024-04-11 MED ORDER — EPHEDRINE 5 MG/ML INJ
INTRAVENOUS | Status: AC
  Filled 2024-04-11: qty 5

## 2024-04-11 MED ORDER — DEXAMETHASONE SODIUM PHOSPHATE 10 MG/ML IJ SOLN
INTRAMUSCULAR | Status: AC
  Filled 2024-04-11: qty 1

## 2024-04-11 MED ORDER — SUCCINYLCHOLINE CHLORIDE 200 MG/10ML IV SOSY
PREFILLED_SYRINGE | INTRAVENOUS | Status: AC
  Filled 2024-04-11: qty 10

## 2024-04-11 MED ORDER — PHENYLEPHRINE 80 MCG/ML (10ML) SYRINGE FOR IV PUSH (FOR BLOOD PRESSURE SUPPORT)
PREFILLED_SYRINGE | INTRAVENOUS | Status: AC
  Filled 2024-04-11: qty 10

## 2024-04-11 MED ORDER — MAGNESIUM SULFATE 2 GM/50ML IV SOLN: 2.0000 g | Freq: Every day | INTRAVENOUS | Status: DC | PRN

## 2024-04-11 MED ORDER — FENTANYL CITRATE (PF) 100 MCG/2ML IJ SOLN: 50.0000 ug | Freq: Once | INTRAMUSCULAR | Status: AC

## 2024-04-11 MED ORDER — INSULIN ASPART 100 UNIT/ML IJ SOLN
0.0000 [IU] | INTRAMUSCULAR | Status: DC | PRN
  Administered 2024-04-11: 4 [IU] via SUBCUTANEOUS
  Filled 2024-04-11: qty 1

## 2024-04-11 MED ORDER — FENTANYL CITRATE (PF) 250 MCG/5ML IJ SOLN
INTRAMUSCULAR | Status: DC | PRN
  Administered 2024-04-11 (×2): 25 ug via INTRAVENOUS

## 2024-04-11 MED ORDER — GLYCOPYRROLATE PF 0.2 MG/ML IJ SOSY
PREFILLED_SYRINGE | INTRAMUSCULAR | Status: AC
  Filled 2024-04-11: qty 1

## 2024-04-11 MED ORDER — INSULIN GLARGINE-YFGN 100 UNIT/ML ~~LOC~~ SOLN
40.0000 [IU] | Freq: Every day | SUBCUTANEOUS | Status: DC
  Administered 2024-04-12: 40 [IU] via SUBCUTANEOUS
  Filled 2024-04-11: qty 0.4

## 2024-04-11 MED ORDER — NEOSTIGMINE METHYLSULFATE 3 MG/3ML IV SOSY
PREFILLED_SYRINGE | INTRAVENOUS | Status: AC
  Filled 2024-04-11: qty 3

## 2024-04-11 MED ORDER — MIDAZOLAM HCL 2 MG/2ML IJ SOLN
INTRAMUSCULAR | Status: AC
Start: 2024-04-11 — End: ?
  Filled 2024-04-11: qty 2

## 2024-04-11 MED ORDER — ALUM & MAG HYDROXIDE-SIMETH 200-200-20 MG/5ML PO SUSP: 15.0000 mL | ORAL | Status: DC | PRN

## 2024-04-11 MED ORDER — DOCUSATE SODIUM 100 MG PO CAPS
100.0000 mg | ORAL_CAPSULE | Freq: Every day | ORAL | Status: DC
  Administered 2024-04-12 – 2024-04-18 (×7): 100 mg via ORAL
  Filled 2024-04-11 (×7): qty 1

## 2024-04-11 MED ORDER — LABETALOL HCL 5 MG/ML IV SOLN: 10.0000 mg | INTRAVENOUS | Status: DC | PRN

## 2024-04-11 MED ORDER — LACTATED RINGERS IV SOLN: INTRAVENOUS | Status: DC

## 2024-04-11 MED ORDER — LIDOCAINE 2% (20 MG/ML) 5 ML SYRINGE
INTRAMUSCULAR | Status: AC
  Filled 2024-04-11: qty 5

## 2024-04-11 MED ORDER — ORAL CARE MOUTH RINSE: 15.0000 mL | Freq: Once | OROMUCOSAL | Status: AC

## 2024-04-11 MED ORDER — PHENYLEPHRINE HCL-NACL 20-0.9 MG/250ML-% IV SOLN
INTRAVENOUS | Status: AC
  Filled 2024-04-11: qty 250

## 2024-04-11 MED ORDER — PROPOFOL 500 MG/50ML IV EMUL
INTRAVENOUS | Status: DC | PRN
Start: 2024-04-11 — End: 2024-04-11
  Administered 2024-04-11: 85 ug/kg/min via INTRAVENOUS

## 2024-04-11 MED ORDER — GUAIFENESIN-DM 100-10 MG/5ML PO SYRP: 15.0000 mL | ORAL_SOLUTION | ORAL | Status: DC | PRN

## 2024-04-11 MED ORDER — ARTIFICIAL TEARS OPHTHALMIC OINT
TOPICAL_OINTMENT | OPHTHALMIC | Status: AC
  Filled 2024-04-11: qty 3.5

## 2024-04-11 MED ORDER — MIDAZOLAM HCL 2 MG/2ML IJ SOLN
INTRAMUSCULAR | Status: DC | PRN
  Administered 2024-04-11: 1 mg via INTRAVENOUS

## 2024-04-11 MED ORDER — CHLORHEXIDINE GLUCONATE 0.12 % MT SOLN
15.0000 mL | Freq: Once | OROMUCOSAL | Status: AC
  Administered 2024-04-11: 15 mL via OROMUCOSAL
  Filled 2024-04-11: qty 15

## 2024-04-11 MED ORDER — ONDANSETRON HCL 4 MG/2ML IJ SOLN: 4.0000 mg | Freq: Four times a day (QID) | INTRAMUSCULAR | Status: DC | PRN

## 2024-04-11 MED ORDER — FENTANYL CITRATE (PF) 100 MCG/2ML IJ SOLN
INTRAMUSCULAR | Status: AC
  Administered 2024-04-11: 50 ug via INTRAVENOUS
  Filled 2024-04-11: qty 2

## 2024-04-11 MED ORDER — ALBUMIN HUMAN 5 % IV SOLN: INTRAVENOUS | Status: DC | PRN

## 2024-04-11 SURGICAL SUPPLY — 51 items
BAG COUNTER SPONGE SURGICOUNT (BAG) ×2 IMPLANT
BANDAGE ESMARK 6X9 LF (GAUZE/BANDAGES/DRESSINGS) IMPLANT
BLADE SAW SGTL 73X25 THK (BLADE) ×2 IMPLANT
BNDG COHESIVE 4X5 WHT NS (GAUZE/BANDAGES/DRESSINGS) IMPLANT
BNDG COHESIVE 6X5 TAN ST LF (GAUZE/BANDAGES/DRESSINGS) ×2 IMPLANT
BNDG ELASTIC 4X5.8 VLCR STR LF (GAUZE/BANDAGES/DRESSINGS) ×2 IMPLANT
BNDG ELASTIC 6INX 5YD STR LF (GAUZE/BANDAGES/DRESSINGS) ×2 IMPLANT
BNDG GAUZE DERMACEA FLUFF 4 (GAUZE/BANDAGES/DRESSINGS) ×4 IMPLANT
CANISTER SUCT 3000ML PPV (MISCELLANEOUS) ×2 IMPLANT
CLIP TI LARGE 6 (CLIP) IMPLANT
CLIP TI MEDIUM 6 (CLIP) ×2 IMPLANT
CLIP TI WIDE RED SMALL 6 (CLIP) ×2 IMPLANT
COVER BACK TABLE 60X90IN (DRAPES) IMPLANT
COVER SURGICAL LIGHT HANDLE (MISCELLANEOUS) ×2 IMPLANT
DRAIN CHANNEL 19F RND (DRAIN) IMPLANT
DRAPE HALF SHEET 40X57 (DRAPES) ×2 IMPLANT
DRAPE INCISE 23X17 STRL (DRAPES) ×2 IMPLANT
DRAPE INCISE IOBAN 23X17 STRL (DRAPES) ×1 IMPLANT
DRAPE INCISE IOBAN 66X45 STRL (DRAPES) ×2 IMPLANT
DRAPE SURG ORHT 6 SPLT 77X108 (DRAPES) ×4 IMPLANT
DRSG ADAPTIC 3X8 NADH LF (GAUZE/BANDAGES/DRESSINGS) IMPLANT
DRSG XEROFORM 1X8 (GAUZE/BANDAGES/DRESSINGS) IMPLANT
ELECTRODE REM PT RTRN 9FT ADLT (ELECTROSURGICAL) ×2 IMPLANT
EVACUATOR SILICONE 100CC (DRAIN) IMPLANT
GAUZE PAD ABD 8X10 STRL (GAUZE/BANDAGES/DRESSINGS) ×2 IMPLANT
GAUZE SPONGE 4X4 12PLY STRL (GAUZE/BANDAGES/DRESSINGS) ×4 IMPLANT
GAUZE XEROFORM 1X8 LF (GAUZE/BANDAGES/DRESSINGS) ×2 IMPLANT
GLOVE BIOGEL PI IND STRL 8 (GLOVE) ×2 IMPLANT
GOWN STRL REUS W/ TWL LRG LVL3 (GOWN DISPOSABLE) ×4 IMPLANT
GOWN STRL REUS W/TWL 2XL LVL3 (GOWN DISPOSABLE) ×4 IMPLANT
KIT BASIN OR (CUSTOM PROCEDURE TRAY) ×2 IMPLANT
KIT TURNOVER KIT B (KITS) ×2 IMPLANT
NS IRRIG 1000ML POUR BTL (IV SOLUTION) ×2 IMPLANT
PACK GENERAL/GYN (CUSTOM PROCEDURE TRAY) ×2 IMPLANT
PAD ABD 8X10 STRL (GAUZE/BANDAGES/DRESSINGS) IMPLANT
PAD ARMBOARD POSITIONER FOAM (MISCELLANEOUS) ×4 IMPLANT
SPONGE T-LAP 18X18 ~~LOC~~+RFID (SPONGE) IMPLANT
STAPLER SKIN PROX 35W (STAPLE) IMPLANT
STAPLER VISISTAT 35W (STAPLE) ×2 IMPLANT
STOCKINETTE IMPERVIOUS LG (DRAPES) ×2 IMPLANT
SUT BONE WAX W31G (SUTURE) IMPLANT
SUT ETHILON 3 0 PS 1 (SUTURE) IMPLANT
SUT SILK 2 0 SH CR/8 (SUTURE) ×2 IMPLANT
SUT SILK 2-0 18XBRD TIE 12 (SUTURE) ×2 IMPLANT
SUT SILK 3-0 18XBRD TIE 12 (SUTURE) ×2 IMPLANT
SUT VIC AB 2-0 CT1 18 (SUTURE) ×4 IMPLANT
SUT VIC AB 3-0 SH 18 (SUTURE) IMPLANT
SUT VIC AB 3-0 SH 8-18 (SUTURE) IMPLANT
TOWEL GREEN STERILE (TOWEL DISPOSABLE) ×4 IMPLANT
UNDERPAD 30X36 HEAVY ABSORB (UNDERPADS AND DIAPERS) ×2 IMPLANT
WATER STERILE IRR 1000ML POUR (IV SOLUTION) ×2 IMPLANT

## 2024-04-11 NOTE — Progress Notes (Signed)
  Progress Note    04/11/2024 7:47 AM * Day of Surgery *  Right TMA infection Scheduled for right BKA today in OR with Dr. Rosalva Comber Remains agreeable to proceed Per RN lost IV access this morning so she has asked the IV team to replace Keep NPO Consent already ordered   Deneen Finical, PA-C [facility] [phone] 04/11/2024 7:47 AM

## 2024-04-11 NOTE — Progress Notes (Signed)
  Progress Note    04/11/2024 8:14 AM * Day of Surgery *  Subjective:  no complaints   Vitals:   04/11/24 0435 04/11/24 0750  BP: (!) 148/60 (!) 153/59  Pulse: 87 93  Resp: 17 18  Temp: 98.3 F (36.8 C) 98.3 F (36.8 C)  SpO2: (!) 86% 95%   Physical Exam: Cardiac:  regular Lungs:  non labored Extremities:  right TMA dressed Neurologic: alert and oriented  CBC    Component Value Date/Time   WBC 10.1 04/11/2024 0649   RBC 3.53 (L) 04/11/2024 0649   HGB 10.8 (L) 04/11/2024 0649   HGB 12.2 09/01/2022 1000   HCT 33.8 (L) 04/11/2024 0649   HCT 38.5 09/01/2022 1000   PLT 298 04/11/2024 0649   PLT 175 09/01/2022 1000   MCV 95.8 04/11/2024 0649   MCV 93 09/01/2022 1000   MCH 30.6 04/11/2024 0649   MCHC 32.0 04/11/2024 0649   RDW 14.6 04/11/2024 0649   RDW 14.4 09/01/2022 1000   LYMPHSABS 2.6 04/05/2024 2203   LYMPHSABS 2.7 09/01/2022 1000   MONOABS 0.9 04/05/2024 2203   EOSABS 0.1 04/05/2024 2203   EOSABS 0.1 09/01/2022 1000   BASOSABS 0.1 04/05/2024 2203   BASOSABS 0.1 09/01/2022 1000    BMET    Component Value Date/Time   NA 138 04/11/2024 0649   NA 141 02/18/2024 0932   K 4.7 04/11/2024 0649   CL 104 04/11/2024 0649   CO2 29 04/11/2024 0649   GLUCOSE 213 (H) 04/11/2024 0649   BUN 11 04/11/2024 0649   BUN 10 02/18/2024 0932   CREATININE 0.83 04/11/2024 0649   CREATININE 0.68 01/31/2015 1641   CALCIUM  8.6 (L) 04/11/2024 0649   GFRNONAA >60 04/11/2024 0649   GFRNONAA >89 01/31/2015 1641   GFRAA 54 (L) 10/25/2020 1131   GFRAA >89 01/31/2015 1641    INR    Component Value Date/Time   INR 1.3 (H) 04/05/2024 2203     Intake/Output Summary (Last 24 hours) at 04/11/2024 0814 Last data filed at 04/11/2024 0735 Gross per 24 hour  Intake 580 ml  Output 751 ml  Net -171 ml     Assessment/Plan:  63 y.o. female with right TMA infection  After discussing the risks and benefits, Jennifer [name] elected to proceed.   Kayla Part MD [facility] [phone] 04/11/2024 8:14 AM

## 2024-04-11 NOTE — Plan of Care (Signed)

## 2024-04-11 NOTE — Transfer of Care (Signed)
 Immediate Anesthesia Transfer of Care Note  Patient: Jennifer [name]  Procedure(s) Performed: RIGHT AMPUTATION BELOW KNEE (Right: Knee)  Patient Location: PACU  Anesthesia Type:MAC and Regional  Level of Consciousness: drowsy  Airway & Oxygen  Therapy: Patient Spontanous Breathing and Patient connected to face mask oxygen   Post-op Assessment: Report given to RN and Post -op Vital signs reviewed and stable  Post vital signs: Reviewed and stable  Last Vitals:  Vitals Value Taken Time  BP 126/63 04/11/24 1139  Temp    Pulse 88 04/11/24 1142  Resp 12 04/11/24 1142  SpO2 100 % 04/11/24 1142  Vitals shown include unfiled device data.  Last Pain:  Vitals:   04/11/24 0813  TempSrc:   PainSc: 3       Patients Stated Pain Goal: 1 (04/11/24 0813)  Complications: No notable events documented.

## 2024-04-11 NOTE — Op Note (Signed)
    NAME: ALIJAH HYDE    MRN: [id number] DOB: [birth year]    DATE OF OPERATION: 04/11/2024  PREOP DIAGNOSIS:    Right lower extremity nonhealing transmetatarsal amputation  POSTOP DIAGNOSIS:    Same  PROCEDURE:    Right below-knee amputation  SURGEON: Kayla Part  ASSIST: Wynonia Hedges, PA  ANESTHESIA: General  EBL:  INDICATIONS:    Jennifer [name] is a 63 y.o. female with known peripheral arterial disease and previous history of left-sided below-knee amputation, right sided transmetatarsal amputation.  Unfortunately, the transmetatarsal amputation failed to heal.  She presents today for below-knee amputation of the right leg.  FINDINGS:   Healthy calf muscle  TECHNIQUE:   After informed consent was obtained, the patient was brought to the OR laid in the supine position.  General anesthesia was induced, and the patient's right leg was prepped and draped in standard fashion.  Incisions were marked for a below the knee amputation about 10 cm below the tibial tuberosity. The skin, subcutaneous tissue, muscle and fascia were divided with cautery.  Hemostats were applied to vascular structures. Notably the tissues were all viable. The tibia and fibula were dissected free of their attachments. Periosteal elevators were used to dissect these cephalad. The fibula was cut with a rib cutter cephalad to the skin incision. The tibia was transected with an stryker saw. The anterior aspect of the bone was bevelled. All sharp aspects of the bone edge were smoothed with a bone rasp. The posterior flap was cut with an amputation knife. Hemostasis was achieved with 3-0 silk stick ties and electrocautery. The flap was measured and trimmed to fit the anterior skin border. The flap was irrigated with copious warm saline. The fascia was approximated with interrupted 2-0 vicryl suture. The skin was brought together with staples. A dry sterile dressing was applied.   Anesthesia was  successfully terminated. The patient was brought to the recovery room in stable condition.    Kayla Part, MD [facility] DATE OF DICTATION:   04/11/2024

## 2024-04-11 NOTE — Plan of Care (Signed)
  Problem: Health Behavior/Discharge Planning: Goal: Ability to manage health-related needs will improve Outcome: Progressing   Problem: Nutrition: Goal: Adequate nutrition will be maintained Outcome: Progressing   Problem: Coping: Goal: Level of anxiety will decrease Outcome: Not Progressing   Problem: Elimination: Goal: Will not experience complications related to bowel motility Outcome: Progressing Goal: Will not experience complications related to urinary retention Outcome: Progressing

## 2024-04-11 NOTE — Anesthesia Procedure Notes (Signed)
 Anesthesia Regional Block: Popliteal block   Pre-Anesthetic Checklist: , timeout performed,  Correct Patient, Correct Site, Correct Laterality,  Correct Procedure, Correct Position, site marked,  Risks and benefits discussed,  Surgical consent,  Pre-op evaluation,  At surgeon's request and post-op pain management  Laterality: Right and Lower  Prep: chloraprep       Needles:  Injection technique: Single-shot      Needle Length: 9cm  Needle Gauge: 22     Additional Needles: Arrow StimuQuik ECHO Echogenic Stimulating PNB Needle  Procedures:,,,, ultrasound used (permanent image in chart),,    Narrative:  Start time: 04/11/2024 9:35 AM End time: 04/11/2024 9:41 AM Injection made incrementally with aspirations every 5 mL.  Performed by: Personally  Anesthesiologist: Arvie Latus, MD

## 2024-04-11 NOTE — Progress Notes (Signed)
 Hospital admission closing outreach   Jennifer [name]  [facility]  Direct Dial : [phone]  Fax [phone]

## 2024-04-11 NOTE — Progress Notes (Addendum)
 BKA stump dry. Pt resting.  No hematoma. Dressing to stay until Friday     Kayla Part MD

## 2024-04-11 NOTE — Progress Notes (Signed)
                  Subjective:   Summary:  63 yo F with PMH of insulin  dependent T2DM, COPD, CKD 2, L BKA in 2014, R TMA in 2024, resection of R fifth metatarsal on 4/8 due to osteomyelitis, readmitted for sepsis 2/2 post-op wound infection and osteomyelitis, receiving R BKA today 4/21.  Hospital Day 5  Patient was in the OR during AM rounds  Seen in the PM. Patient still very groggy from anesthesia. No acute complaints.   Objective:  Vital signs in last 24 hours: Vitals:   04/10/24 1654 04/10/24 2024 04/11/24 0435 04/11/24 0750  BP: 137/63 131/62 (!) 148/60 (!) 153/59  Pulse: 78 89 87 93  Resp: 18 17 17 18   Temp: 98.7 F (37.1 C) 98.3 F (36.8 C) 98.3 F (36.8 C) 98.3 F (36.8 C)  TempSrc:  Oral Oral Oral  SpO2: 98% 98% (!) 86% 95%  Weight:    99.8 kg  Height:    5\' 5"  (1.651 m)   Supplemental O2: Room air SpO2: 95 %  Physical Exam:  Constitutional: sedated, groggy. No acute distress Ext: new R BKA in clean and dry dressing  Assessment/Plan:   63 yo F with L BKA, recurrent R foot osteomyelitis now with recurrent infection requiring R BKA. Patient will likely require a high level of care post-op now she will be entirely wheelchair dependent.   Post-op R foot wound infection, osteomyelitis R fifth metatarsal osteomyelitis s/p resection 4/8 Sepsis, resolved R BKA today by vascular surgery - Unasyn  and vancomycin , day 6 abx coverage - Blood cultures NGTD - Scheduled Tylenol  1000mg  q8h, oxycodone  10mg  q4h prn, pregabalin  200mg  TID   T2DM Blood sugar remains outside of goal to the mid 200s. Insulin  regimen decreased in anticipation of NPO status today, will resume once diet is resumed - Semglee  25 units. Will resume Semglee  40 units daily, mealtime Novolog  5 units, moderate SSI with meals and at bedtime after diet is resumed  HTN: Normotensive on home amlodipine  10mg  HLD, PAD: continue home rosuvastatin  20mg  Chronic back pain: continue home pregabalin  200mg  TID COPD:  continue home Anoro Ellipta  Depression: continue home duloxetine  60mg  GERD: continue home pantoprazole  40mg  CKD2: stable, monitor with morning labs Urinary incontinence: continue home Myrbetriq  Gastroparesis: Reglan  10mg  qid  Diet: NPO for procedure today. Resume HH/CM diet after surgery VTE: Lovenox  Code: Full  Dispo: Anticipated discharge to Skilled nursing facility pending medical stability.   Jayson Michael, MD PGY-1 Internal Medicine Resident Pager Number [phone] Please contact the on call pager after 5 pm and on weekends at [phone].

## 2024-04-11 NOTE — Anesthesia Procedure Notes (Signed)
 Anesthesia Regional Block: Femoral nerve block   Pre-Anesthetic Checklist: , timeout performed,  Correct Patient, Correct Site, Correct Laterality,  Correct Procedure, Correct Position, site marked,  Risks and benefits discussed,  Surgical consent,  Pre-op evaluation,  At surgeon's request and post-op pain management  Laterality: Right and Lower  Prep: chloraprep       Needles:  Injection technique: Single-shot      Needle Length: 9cm  Needle Gauge: 22     Additional Needles: Arrow StimuQuik ECHO Echogenic Stimulating PNB Needle  Procedures:,,,, ultrasound used (permanent image in chart),,     Nerve Stimulator or Paresthesia:  Response: quad, 0.2 mA  Additional Responses:   Narrative:  Start time: 04/11/2024 9:28 AM End time: 04/11/2024 9:33 AM Injection made incrementally with aspirations every 5 mL.  Performed by: Personally  Anesthesiologist: Arvie Latus, MD

## 2024-04-12 ENCOUNTER — Encounter (HOSPITAL_COMMUNITY): Payer: Self-pay | Admitting: Vascular Surgery

## 2024-04-12 LAB — CBC
HCT: 26.4 % — ABNORMAL LOW (ref 36.0–46.0)
Hemoglobin: 8.4 g/dL — ABNORMAL LOW (ref 12.0–15.0)
MCH: 30.3 pg (ref 26.0–34.0)
MCHC: 31.8 g/dL (ref 30.0–36.0)
MCV: 95.3 fL (ref 80.0–100.0)
Platelets: 282 10*3/uL (ref 150–400)
RBC: 2.77 MIL/uL — ABNORMAL LOW (ref 3.87–5.11)
RDW: 14.5 % (ref 11.5–15.5)
WBC: 10.8 10*3/uL — ABNORMAL HIGH (ref 4.0–10.5)
nRBC: 0 % (ref 0.0–0.2)

## 2024-04-12 LAB — BASIC METABOLIC PANEL WITH GFR
Anion gap: 9 (ref 5–15)
BUN: 11 mg/dL (ref 8–23)
CO2: 25 mmol/L (ref 22–32)
Calcium: 7.9 mg/dL — ABNORMAL LOW (ref 8.9–10.3)
Chloride: 102 mmol/L (ref 98–111)
Creatinine, Ser: 0.83 mg/dL (ref 0.44–1.00)
GFR, Estimated: >60 mL/min (ref >60)
Glucose, Bld: 179 mg/dL — ABNORMAL HIGH (ref 70–99)
Potassium: 4 mmol/L (ref 3.5–5.1)
Sodium: 136 mmol/L (ref 135–145)

## 2024-04-12 LAB — GLUCOSE, CAPILLARY
Glucose-Capillary: 260 mg/dL — ABNORMAL HIGH (ref 70–99)
Glucose-Capillary: 251 mg/dL — ABNORMAL HIGH (ref 70–99)
Glucose-Capillary: 162 mg/dL — ABNORMAL HIGH (ref 70–99)
Glucose-Capillary: 279 mg/dL — ABNORMAL HIGH (ref 70–99)

## 2024-04-12 MED ORDER — INSULIN GLARGINE-YFGN 100 UNIT/ML ~~LOC~~ SOLN
55.0000 [IU] | Freq: Every day | SUBCUTANEOUS | Status: DC
  Administered 2024-04-13: 55 [IU] via SUBCUTANEOUS
  Filled 2024-04-12: qty 0.55

## 2024-04-12 MED ORDER — BACLOFEN 10 MG PO TABS
5.0000 mg | ORAL_TABLET | Freq: Two times a day (BID) | ORAL | Status: DC | PRN
  Administered 2024-04-18: 5 mg via ORAL
  Filled 2024-04-12: qty 1

## 2024-04-12 MED ORDER — HYDROMORPHONE HCL 1 MG/ML IJ SOLN
1.0000 mg | Freq: Once | INTRAMUSCULAR | Status: AC
  Administered 2024-04-12: 1 mg via INTRAVENOUS
  Filled 2024-04-12: qty 1

## 2024-04-12 MED ORDER — POLYETHYLENE GLYCOL 3350 17 G PO PACK
17.0000 g | PACK | Freq: Every day | ORAL | Status: DC
  Administered 2024-04-12 – 2024-04-15 (×4): 17 g via ORAL
  Filled 2024-04-12 (×7): qty 1

## 2024-04-12 MED ORDER — VANCOMYCIN HCL 1.25 G IV SOLR
1250.0000 mg | INTRAVENOUS | Status: AC
  Administered 2024-04-12: 1250 mg via INTRAVENOUS
  Filled 2024-04-12: qty 25

## 2024-04-12 NOTE — Progress Notes (Signed)
  Daily Progress Note  S/p:1 Day Post-Op Right BKA   Subjective: Resting comfortably. Pain controlled  Objective: Vitals:   04/12/24 0509 04/12/24 0807  BP: 130/74 (!) 136/57  Pulse: 80 86  Resp: 16 17  Temp: 99.2 F (37.3 C) 98.9 F (37.2 C)  SpO2: 91% 96%    Physical Examination Nonlabored breathing  Regular rate Dressing dry  ASSESSMENT/PLAN:   Doing well. PT/OT D/c planning. Dressing down prior to d/c likley Thursday v Friday    Kayla Part MD MS [facility] [phone] 04/12/2024  8:13 AM

## 2024-04-12 NOTE — Progress Notes (Signed)
                  Subjective:   Summary:  63 yo F with PMH of insulin  dependent T2DM, COPD, CKD 2, L BKA in 2014, R TMA in 2024, resection of R fifth metatarsal on 4/8 due to osteomyelitis, readmitted for sepsis 2/2 post-op wound infection and osteomyelitis, receiving R BKA today 4/21.  Hospital Day 6  Patient is emotionally distressed about her procedure. She is still very pessimistic about the future. Pain is poorly controlled. No new issues  Objective:  Vital signs in last 24 hours: Vitals:   04/12/24 0150 04/12/24 0509 04/12/24 0807 04/12/24 0820  BP:  130/74 (!) 136/57   Pulse:  80 86   Resp:  16 17   Temp: (!) 100.7 F (38.2 C) 99.2 F (37.3 C) 98.9 F (37.2 C)   TempSrc: Oral Oral Oral   SpO2:  91% 96% 96%  Weight:      Height:       Supplemental O2: 2L [place] SpO2: 96 % O2 Flow Rate (L/min): 2 L/min  Physical Exam:  Constitutional: chronically ill appearing. In mild physical distress Psych: depressed mood Ext: new R BKA in clean and dry dressing  Assessment/Plan:   63 yo F with L BKA, recurrent R foot osteomyelitis now with recurrent infection requiring R BKA. Patient will likely require a high level of care post-op now she will be entirely wheelchair dependent.   Post-op R foot wound infection, osteomyelitis R fifth metatarsal osteomyelitis s/p resection 4/8 Sepsis, resolved POD 1 from R BKA by [facility]. Per their recommendations, dressing can be changed Thursday or Friday. Also recommending continued antibiotic coverage for 48 hours post op.  - Unasyn  and vancomycin , day 7 abx coverage. End date tomorrow - Blood cultures NGTD - Scheduled Tylenol  1000mg  q8h, oxycodone  10mg  q4h prn, IV dilaudid  0.5mg  q2h prn,  pregabalin  200mg  TID   T2DM Blood sugar moderately well-controlled from mid 100s to 200s - Semglee  40 units daily, mealtime Novolog  5 units, moderate SSI with meals and at bedtime  HTN: Normotensive on home amlodipine  10mg  HLD, PAD: continue home rosuvastatin   20mg  Chronic back pain: continue home pregabalin  200mg  TID COPD: continue home Anoro Ellipta  Depression: continue home duloxetine  60mg  GERD: continue home pantoprazole  40mg  CKD2: stable, monitor with morning labs Urinary incontinence: continue home Myrbetriq  Gastroparesis: Reglan  10mg  qid  Diet: HH/CM VTE: Lovenox  Code: Full  Dispo: Anticipated discharge to Skilled nursing facility pending medical stability.   Jayson Michael, MD PGY-1 Internal Medicine Resident Pager Number [phone] Please contact the on call pager after 5 pm and on weekends at [phone].

## 2024-04-12 NOTE — Plan of Care (Signed)
  Problem: Pain Management: Goal: Pain level will decrease with appropriate interventions Outcome: Progressing   Problem: Skin Integrity: Goal: Demonstration of wound healing without infection will improve Outcome: Progressing   

## 2024-04-12 NOTE — Evaluation (Signed)
 Physical Therapy Re-Evaluation Patient Details Name: Jennifer [name] MRN: [id number] DOB: Jan 13, 1961 Today's Date: 04/12/2024  History of Present Illness  63 yo female presents to [facility] on 4/15 with severe R foot pain. 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 Pt recently d/c from acute setting 4/10 s/p debridement of R 5th metatarsal resection, vascular surgery. 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 resection, vascular surgery. 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  Clinical Impression   Pt now s/p R BKA. Pt presents with R residual limb pain, impaired balance, impaired activity tolerance, and difficulty with mobility. Pt to benefit from acute PT to address deficits. Pt moves to/from EOB well with very increased time and cues, but declines transfer OOB either via AP transfer or slide board. PT feels pt would benefit from inpatient post-acute rehab given pt's new amputation and lack of physical assist at home, pt states her son is not able to provide much assist. Pt will have to get proficient at transfer in/out wheelchair with and without slide board with little to no assist in order to d/c home. PT to progress mobility as tolerated, and will continue to follow acutely.          If plan is discharge home, recommend the following: A little help with bathing/dressing/bathroom;Assistance with cooking/housework;Assist for transportation;Help with stairs or ramp for entrance;A lot of help with walking and/or transfers   Can travel by private vehicle        Equipment Recommendations Wheelchair (measurements PT);Wheelchair cushion (measurements PT);Other (comment) (tub bench,  also asking for power w/c to be serviced)  Recommendations for Other Services       Functional Status Assessment Patient has had a recent decline in their functional status and demonstrates the ability to make significant improvements in function in a reasonable and predictable amount of time.     Precautions / Restrictions Precautions Precautions: Fall Precaution/Restrictions Comments: B BKA Required Braces or Orthoses: Other Brace Other Brace: L BKA prosthesis Restrictions Weight Bearing Restrictions Per Provider Order: Yes RLE Weight Bearing Per Provider Order: Non weight bearing      Mobility  Bed Mobility Overal bed mobility: Needs Assistance Bed Mobility: Supine to Sit, Sit to Supine     Supine to sit: Contact guard, HOB elevated Sit to supine: Contact guard assist, Used rails, HOB elevated   General bed mobility comments: Cueing, pt unable to maintain NWB status during bed mobility despite cues    Transfers Overall transfer level: Needs assistance Equipment used: None               General transfer comment: Lateral scoots performed alongside bed, CGA and cues for sequencing    Ambulation/Gait                  Stairs            Wheelchair Mobility     Tilt Bed    Modified Rankin (Stroke Patients Only)       Balance Overall balance assessment: Needs assistance Sitting-balance support: No upper extremity supported, Feet supported Sitting balance-Leahy Scale: Good Sitting balance - Comments: tolerates lateral scooting well, with increased time  Pertinent Vitals/Pain Pain Assessment Pain Assessment: Faces Faces Pain Scale: Hurts little more Pain Location: RLE Pain Descriptors / Indicators: Aching, Crying, Discomfort, Grimacing, Guarding Pain Intervention(s): Limited activity within patient's tolerance, Monitored during session, Repositioned    Home Living Family/patient expects  to be discharged to:: Private residence Living Arrangements: Children;Other relatives Available Help at Discharge: Family;Available 24 hours/day Type of Home: House Home Access: Ramped entrance       Home Layout: One level Home Equipment: Rollator (4 wheels);Tub bench;Wheelchair - power Additional Comments: . Pt lives with son who can help as needed. mostly uses Rollator in home due to lack of room for power wheelchair, use wheelchair for mobility outside the home. power w/c not working at the present    Prior Function Prior Level of Function : Needs assist       Physical Assist : Mobility (physical)     Mobility Comments: uses Rollator and prosthetic to get around her home, electric wheelchair in community, father provides transportation ADLs Comments: son assists with meal prep, pt does her own bathing, dressing and toileting     Extremity/Trunk Assessment   Upper Extremity Assessment Upper Extremity Assessment: Defer to OT evaluation RUE Deficits / Details: grossly WFLs, reports RA, RUE Sensation: history of peripheral neuropathy LUE Deficits / Details: grossly WFLs, reports RA LUE Sensation: history of peripheral neuropathy    Lower Extremity Assessment Lower Extremity Assessment: Generalized weakness;RLE deficits/detail RLE Deficits / Details: anticipated post-op weakness; able to perform quad set, SLR with lift assist RLE Sensation: history of peripheral neuropathy LLE Deficits / Details: L BKA, WFL hip and knee ROM and strength. Calloused area along lateral aspect of lower leg suspect due to prosthetic use LLE Sensation: decreased light touch    Cervical / Trunk Assessment Cervical / Trunk Assessment: Kyphotic  Communication   Communication Communication: No apparent difficulties    Cognition Arousal: Alert Behavior During Therapy: WFL for tasks assessed/performed   PT - Cognitive impairments: Safety/Judgement                       PT - Cognition  Comments: doesn't always follow safety cues Following commands: Intact       Cueing Cueing Techniques: Verbal cues, Tactile cues, Visual cues     General Comments General comments (skin integrity, edema, etc.): Dressing for limb intact    Exercises     Assessment/Plan    PT Assessment Patient needs continued PT services  PT Problem List Decreased strength;Decreased range of motion;Decreased activity tolerance;Decreased balance;Decreased mobility;Impaired sensation;Decreased skin integrity;Pain;Decreased safety awareness;Decreased knowledge of precautions       PT Treatment Interventions DME instruction;Functional mobility training;Therapeutic activities;Therapeutic exercise;Balance training;Cognitive remediation;Patient/family education    PT Goals (Current goals can be found in the Care Plan section)  Acute Rehab PT Goals PT Goal Formulation: With patient Time For Goal Achievement: 04/22/24 Potential to Achieve Goals: Fair    Frequency Min 3X/week     Co-evaluation   Reason for Co-Treatment: Complexity of the patient's impairments (multi-system involvement);For patient/therapist safety;To address functional/ADL transfers   OT goals addressed during session: ADL's and self-care;Strengthening/ROM       AM-PAC PT "6 Clicks" Mobility  Outcome Measure Help needed turning from your back to your side while in a flat bed without using bedrails?: A Little Help needed moving from lying on your back to sitting on the side of a flat bed without using bedrails?: A Little Help needed moving to and from a bed to a  chair (including a wheelchair)?: A Lot Help needed standing up from a chair using your arms (e.g., wheelchair or bedside chair)?: Total Help needed to walk in hospital room?: Total Help needed climbing 3-5 steps with a railing? : Total 6 Click Score: 11    End of Session Equipment Utilized During Treatment: Oxygen  (o2 reapplied at end of session given SPO2 89% on  RA) Activity Tolerance: Patient tolerated treatment well Patient left: with call bell/phone within reach;in bed;with bed alarm set;Other (comment) Nurse Communication: Mobility status PT Visit Diagnosis: Other abnormalities of gait and mobility (R26.89);Muscle weakness (generalized) (M62.81);Pain Pain - part of body: Ankle and joints of foot    Time: 1135-1200 PT Time Calculation (min) (ACUTE ONLY): 25 min   Charges:   PT Evaluation $PT Re-evaluation: 1 Re-eval   PT General Charges $$ ACUTE PT VISIT: 1 Visit         Shirlene Doughty, PT DPT Acute Rehabilitation Services Secure Chat Preferred  Office [phone]   Keniya Schlotterbeck E Burnadette Carrion 04/12/2024, 2:22 PM

## 2024-04-12 NOTE — TOC Progression Note (Addendum)
 Transition of Care (TOC) - Progression Note   Spoke with patient at bedside. Discussed PT/OT recommendations for short term rehab at a SNF. Patient voices understanding but wants to go home at discharge. Lives with her friend, friend's husband , niece and nephew and states they can assist her.   Wants to continue [facility] with [facility] at discharge. Updated [place] [facility] with [facility] will need orders for RN,PT,OT and face to face.   PT /OT recommending Bariatric drop arm bedside commode, wheelchair, and tub bench. Patient states she has all three already but they are broken and she wants new. Patient did get DME through insurance but states it has been longer then 5 years. Will ask for orders and DME company will submit to insurance to see if DME will be covered .    Patient does not want ambulance transport home. Patient believes family can get her in and out of car. NCM secure chatted team including MD , and PT   Secure chatted Dr Esaw Heckler for orders. He will discuss SNF with patient will await determination.   Patient states she has a sliding board at home. NCM suggested to patient to have family come while PT is working with her. NCM secure chatted PT  Patient Details  Name: Jennifer [name] MRN: [id number] Date of Birth: Oct 13, 1961  Transition of Care [facility]) CM/SW Contact  Jamale Spangler, Arturo Late, RN Phone Number: 04/12/2024, 1:35 PM  Clinical Narrative:       Expected Discharge Plan:  (await post op PT/OT recommendations) Barriers to Discharge: Continued Medical Work up  Expected Discharge Plan and Services   Discharge Planning Services: CM Consult Post Acute Care Choice:  (await post op PT/OT recommendations) Living arrangements for the past 2 months: Single Family Home                 DME Arranged:  (await post op PT/OT recommendations)         HH Arranged:  (await post op PT/OT recommendations)           Social Determinants of Health (SDOH) Interventions SDOH Screenings   Food  Insecurity: No Food Insecurity (04/06/2024)  Housing: Low Risk  (04/06/2024)  Transportation Needs: No Transportation Needs (04/06/2024)  Recent Concern: Transportation Needs - Unmet Transportation Needs (03/24/2024)  Utilities: Not At Risk (04/06/2024)  Recent Concern: Utilities - At Risk (03/24/2024)  Alcohol Screen: Low Risk  (03/31/2023)  Depression (PHQ2-9): High Risk (10/08/2023)  Financial Resource Strain: High Risk (03/31/2023)  Physical Activity: Inactive (03/31/2023)  Social Connections: Socially Isolated (04/06/2024)  Stress: Stress Concern Present (03/31/2023)  Tobacco Use: High Risk (04/11/2024)    Readmission Risk Interventions    04/07/2024   12:00 PM 03/29/2024    1:06 PM 10/27/2021   10:18 AM  Readmission Risk Prevention Plan  Transportation Screening Complete Complete Complete  PCP or Specialist Appt within 3-5 Days   Complete  HRI or Home Care Consult  Complete Complete  Social Work Consult for Recovery Care Planning/Counseling  Complete Complete  Palliative Care Screening  Not Applicable Not Applicable  Medication Review (RN Care Manager) Referral to Pharmacy Referral to Pharmacy Complete  PCP or Specialist appointment within 3-5 days of discharge Complete    HRI or Home Care Consult Complete    SW Recovery Care/Counseling Consult Complete

## 2024-04-12 NOTE — Evaluation (Signed)
 Occupational Therapy Evaluation Patient Details Name: Jennifer [name] MRN: [id number] DOB: 1961/07/13 Today's Date: 04/12/2024   History of Present Illness   63 yo female presents to [facility] on 4/15 with severe R foot pain. 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 Pt recently d/c from acute setting 4/10 s/p debridement of R 5th metatarsal resection, vascular surgery. 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 resection, vascular surgery. 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     Clinical Impressions Pt admitted based on above, and was seen based on problem list below. PTA pt was independent with ADLs. Today pt is requiring set up  to min  for ADLs. Bed mobility was  CGA. Pt tearful during session regarding her new BKA. Pt comforted and encouraged to remain positive for outlook. Pt able to perform lateral scoots along EOB. Red theraband provided to pt to build BUE strength necessary for functional transfers and ADLs. Pt would benefit from<3 hours of skilled rehab daily. At this time pt is requesting HHOT, with the following DME recommendations below. OT will continue to follow acutely to maximize functional independence.        If plan is discharge home, recommend the following:   A little help with walking and/or transfers;A little help with bathing/dressing/bathroom;Assistance with cooking/housework     Functional Status Assessment   Patient has had a recent decline in their functional status and demonstrates the ability to make significant improvements in function in a reasonable and predictable amount of time.     Equipment Recommendations   Other (comment) (Drop  arm bariatric BSC)     Recommendations for Other Services         Precautions/Restrictions   Precautions Precautions: Fall Restrictions Weight Bearing Restrictions Per Provider Order: Yes RLE Weight Bearing Per Provider Order: Non weight bearing     Mobility Bed Mobility Overal bed mobility: Needs Assistance Bed Mobility: Supine to Sit, Sit to Supine     Supine to sit: Contact guard, HOB elevated Sit to supine: Contact guard assist, Used rails, HOB elevated   General bed mobility comments: Cueing, pt unable to maintain NWB status during bed mobility despite cues    Transfers Overall transfer level: Needs assistance Equipment used: None     General transfer comment: Lateral scoots performed alongside bed, CGA      Balance Overall balance assessment: Needs assistance Sitting-balance support: No upper extremity supported, Feet supported Sitting balance-Leahy Scale: Good         ADL either performed or assessed with clinical judgement   ADL Overall ADL's : Needs assistance/impaired Eating/Feeding: Set up;Sitting   Grooming: Set up;Sitting   Upper Body Bathing: Set up;Sitting   Lower Body Bathing: Set up;Sitting/lateral leans   Upper Body Dressing : Set up;Sitting   Lower Body Dressing: Minimal assistance;Sitting/lateral leans Lower Body Dressing Details (indicate cue type and reason): Pt able to reach Bil LEs, would need assist to laterally lean to don pants above waist       General ADL Comments: Unable to progress to complete functional mobility     Vision Baseline Vision/History: 1 Wears glasses Vision Assessment?: No apparent visual deficits            Pertinent Vitals/Pain Pain  Assessment Pain Assessment: Faces Faces Pain Scale: Hurts worst Pain Location: RLE Pain Descriptors / Indicators: Aching, Crying, Discomfort, Grimacing, Guarding Pain Intervention(s): Limited activity within patient's tolerance, Repositioned      Extremity/Trunk Assessment Upper Extremity Assessment Upper Extremity Assessment: Generalized weakness;RUE deficits/detail;LUE deficits/detail RUE Deficits / Details: grossly WFLs, reports RA, RUE Sensation: history of peripheral neuropathy LUE Deficits / Details: grossly WFLs, reports RA LUE Sensation: history of peripheral neuropathy   Lower Extremity Assessment Lower Extremity Assessment: Defer to PT evaluation   Cervical / Trunk Assessment Cervical / Trunk Assessment: Kyphotic   Communication Communication Communication: No apparent difficulties   Cognition Arousal: Alert Behavior During Therapy: WFL for tasks assessed/performed Cognition: No apparent impairments     Following commands: Intact       Cueing  General Comments   Cueing Techniques: Verbal cues;Tactile cues;Visual cues  Dressing for limb intact   Exercises Exercises: General Upper Extremity General Exercises - Upper Extremity Shoulder Flexion: AROM, Strengthening, Both, 10 reps, Supine, Theraband Theraband Level (Shoulder Flexion): Level 2 (Red)        Home Living Family/patient expects to be discharged to:: Private residence Living Arrangements: Children;Other relatives Available Help at Discharge: Family;Available 24 hours/day Type of Home: House Home Access: Ramped entrance     Home Layout: One level     Bathroom Shower/Tub: Chief Strategy Officer: Standard Bathroom Accessibility: Yes How Accessible: Accessible via walker Home Equipment: Rollator (4 wheels);Tub bench;Wheelchair - power   Additional Comments: . Pt lives with son who can help as needed. mostly uses Rollator in home due to lack of room for power wheelchair, use wheelchair for mobility outside the home. power w/c not working at the present      Prior Functioning/Environment Prior Level of Function : Needs assist       Physical Assist : Mobility (physical)     Mobility Comments: uses Rollator and  prosthetic to get around her home, electric wheelchair in community, father provides transportation ADLs Comments: son assists with meal prep, pt does her own bathing, dressing and toileting    OT Problem List: Impaired balance (sitting and/or standing);Decreased activity tolerance;Decreased strength;Pain   OT Treatment/Interventions: Self-care/ADL training;Therapeutic exercise;Energy conservation;DME and/or AE instruction;Therapeutic activities;Patient/family education;Balance training      OT Goals(Current goals can be found in the care plan section)   Acute Rehab OT Goals Patient Stated Goal: To go home OT Goal Formulation: With patient Time For Goal Achievement: 04/23/24 Potential to Achieve Goals: Good   OT Frequency:  Min 2X/week    Co-evaluation PT/OT/SLP Co-Evaluation/Treatment: Yes Reason for Co-Treatment: Complexity of the patient's impairments (multi-system involvement);For patient/therapist safety;To address functional/ADL transfers   OT goals addressed during session: ADL's and self-care;Strengthening/ROM      AM-PAC OT "6 Clicks" Daily Activity     Outcome Measure Help from another person eating meals?: None Help from another person taking care of personal grooming?: A Little Help from another person toileting, which includes using toliet, bedpan, or urinal?: A Little Help from another person bathing (including washing, rinsing, drying)?: A Little Help from another person to put on and taking off regular upper body clothing?: A Little Help from another person to put on and taking off regular lower body clothing?: A Little 6 Click Score: 19   End of Session Nurse Communication: Mobility status  Activity Tolerance: Patient limited by pain Patient left: in bed;with call bell/phone within reach;with bed alarm set  OT Visit Diagnosis: Other abnormalities of gait and mobility (R26.89);Muscle  weakness (generalized) (M62.81);Pain                Time: 1136-1200 OT  Time Calculation (min): 24 min Charges:  OT General Charges $OT Visit: 1 Visit OT Evaluation $OT Re-eval: 1 Re-eval  Delmer Ferraris, OT  Acute Rehabilitation Services Office [phone] Secure chat preferred   Mickael Alamo 04/12/2024, 12:45 PM

## 2024-04-13 LAB — GLUCOSE, CAPILLARY
Glucose-Capillary: 203 mg/dL — ABNORMAL HIGH (ref 70–99)
Glucose-Capillary: 230 mg/dL — ABNORMAL HIGH (ref 70–99)
Glucose-Capillary: 206 mg/dL — ABNORMAL HIGH (ref 70–99)
Glucose-Capillary: 394 mg/dL — ABNORMAL HIGH (ref 70–99)

## 2024-04-13 LAB — CBC
HCT: 25.6 % — ABNORMAL LOW (ref 36.0–46.0)
Hemoglobin: 8.1 g/dL — ABNORMAL LOW (ref 12.0–15.0)
MCH: 29.8 pg (ref 26.0–34.0)
MCHC: 31.6 g/dL (ref 30.0–36.0)
MCV: 94.1 fL (ref 80.0–100.0)
Platelets: 297 10*3/uL (ref 150–400)
RBC: 2.72 MIL/uL — ABNORMAL LOW (ref 3.87–5.11)
RDW: 14.3 % (ref 11.5–15.5)
WBC: 11 10*3/uL — ABNORMAL HIGH (ref 4.0–10.5)
nRBC: 0 % (ref 0.0–0.2)

## 2024-04-13 LAB — SURGICAL PATHOLOGY

## 2024-04-13 LAB — CREATININE, SERUM
Creatinine, Ser: 0.84 mg/dL (ref 0.44–1.00)
GFR, Estimated: >60 mL/min (ref >60)

## 2024-04-13 MED ORDER — INSULIN GLARGINE-YFGN 100 UNIT/ML ~~LOC~~ SOLN
60.0000 [IU] | Freq: Every day | SUBCUTANEOUS | Status: AC
  Administered 2024-04-14: 60 [IU] via SUBCUTANEOUS
  Filled 2024-04-13: qty 0.6

## 2024-04-13 MED ORDER — HYDROMORPHONE HCL 1 MG/ML IJ SOLN
1.0000 mg | INTRAMUSCULAR | Status: DC | PRN
  Administered 2024-04-13 – 2024-04-14 (×2): 1 mg via INTRAVENOUS
  Filled 2024-04-13 (×2): qty 1

## 2024-04-13 MED ORDER — ENSURE ENLIVE PO LIQD
237.0000 mL | Freq: Two times a day (BID) | ORAL | Status: DC
  Administered 2024-04-13 – 2024-04-17 (×7): 237 mL via ORAL

## 2024-04-13 MED ORDER — INSULIN ASPART 100 UNIT/ML IJ SOLN
7.0000 [IU] | Freq: Three times a day (TID) | INTRAMUSCULAR | Status: DC
  Administered 2024-04-13 – 2024-04-15 (×5): 7 [IU] via SUBCUTANEOUS

## 2024-04-13 NOTE — Progress Notes (Signed)
 Inpatient Rehab Admissions Coordinator:   Per therapy recommendations, patient was screened for CIR candidacy by Wandalee Gust, MS, CCC-SLP. At this time, Pt. does not have a diagnosis that I believe payor will approve for CIR admit. I will not pursue a rehab consult for this Pt.   Recommend other rehab venues to be pursued.  Please contact me with any questions.  Wandalee Gust, MS, CCC-SLP Rehab Admissions Coordinator  [phone] (celll) [phone] (office)

## 2024-04-13 NOTE — Progress Notes (Addendum)
  Daily Progress Note  S/p:2 Days Post-Op Right BKA   Subjective: Resting comfortably. No complaints.  Objective: Vitals:   04/13/24 0724 04/13/24 0811  BP: (!) 149/64   Pulse: 84   Resp: 16   Temp: 98.3 F (36.8 C)   SpO2: 92% 92%    Physical Examination Nonlabored breathing  Regular rate Dressing dry  ASSESSMENT/PLAN:   Doing well. PT/OT D/c planning. Dressing down Friday or prior to d/c    Kayla Part MD MS [facility] [phone] 04/13/2024  3:37 PM

## 2024-04-13 NOTE — Anesthesia Postprocedure Evaluation (Addendum)
 Anesthesia Post Note  Patient: Jennifer [name]  Procedure(s) Performed: RIGHT AMPUTATION BELOW KNEE (Right: Knee)     Patient location during evaluation: PACU Anesthesia Type: MAC and Regional Level of consciousness: patient cooperative Pain management: pain level controlled Vital Signs Assessment: post-procedure vital signs reviewed and stable Respiratory status: spontaneous breathing, nonlabored ventilation and respiratory function stable Cardiovascular status: stable and blood pressure returned to baseline Postop Assessment: no apparent nausea or vomiting Anesthetic complications: no   No notable events documented.                  Kerianne Gurr

## 2024-04-13 NOTE — Plan of Care (Signed)
  Problem: Pain Management: Goal: Pain level will decrease with appropriate interventions Outcome: Progressing   Problem: Skin Integrity: Goal: Demonstration of wound healing without infection will improve Outcome: Progressing   

## 2024-04-13 NOTE — Progress Notes (Signed)
 Physical Therapy Treatment Patient Details Name: Jennifer [name] MRN: [id number] DOB: 12-09-1961 Today's Date: 04/13/2024   History of Present Illness 63 yo female presents to [facility] on 4/15 with severe R foot pain. 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 Pt recently d/c from acute setting 4/10 s/p debridement of R 5th metatarsal resection, vascular surgery. 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 resection, vascular surgery. 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    PT Comments  Patient resting in bed and agreeable to mobilize with therapy. Pt followed cues well for use of bed rail to roll Lt with min assist. Ultimately mod assist required to raise trunk upright, once EOB pt able to maintain seated balance with CGA/supervision with bil UE support. Pt provided Lt prosthesis and donned liner independently but slightly off alignment; min assist to reposition with cues to ensure no air pocket at bottom of liner. Pt able to seat liner pin into socket with supervision. Rt lateral lean for slide board positioning and to mod assist for initial lateral scoot fading to min assist with bed pad to guide direction of transfer. Once in recliner pt completed partial lift with bil UE on armrest and Lt prosthetic to allow for removal of extra pad. Patient acknowledges need for additional rehab and expresses motivation to progress to mod ind level with WC for safety at home. Pt demonstrates excellent potential to progress to [facility] user and has a ramped entrance at home, one level home, and 24 hours assist available. Given new Rt BKA and good prospect to become mod ind WC user, patient will benefit from  intensive inpatient follow-up therapy, >3 hours/day. Will continue to progress pt as able during acute stay.     If plan is discharge home, recommend the following:     Can travel by private vehicle        Equipment Recommendations       Recommendations for Other Services Rehab consult     Precautions / Restrictions Precautions Precautions: Fall Recall of Precautions/Restrictions: Intact Precaution/Restrictions Comments: B BKA Required Braces or Orthoses: Other Brace Other Brace: L BKA prosthesis Restrictions Weight Bearing Restrictions Per Provider Order: Yes RLE Weight Bearing Per Provider Order: Non weight bearing Other Position/Activity Restrictions: NWB RLE until BKA     Mobility  Bed Mobility Overal bed mobility: Needs Assistance Bed Mobility: Rolling, Sidelying to Sit Rolling: Min assist, Used rails Sidelying to sit: Mod assist, Used rails       General bed mobility comments: cues for use of bed rail to roll Lt with min assist. Ultimately mod assist required to raise trunk upright, once EOB pt able to maintain seated balance with CGA/supervision with bil UE support.    Transfers Overall transfer level: Needs assistance Equipment used: Sliding board, Rolling walker (2 wheels) Transfers: Sit to/from Stand, Bed to chair/wheelchair/BSC Sit to Stand: Mod assist (50% stand)          Lateral/Scoot Transfers: With slide board, Min assist, Mod assist General transfer comment: Rt lateral lean for slide board positioning and to mod assist for initial lateral scoot fading to min assist with bed pad to guide direction of transfer. Once in recliner pt completed partial lift with bil UE on armrest  and Lt prosthetic to allow for removal of extra pad.    Ambulation/Gait                   Stairs             Wheelchair Mobility     Tilt Bed    Modified Rankin (Stroke Patients Only)       Balance Overall balance assessment: Needs  assistance Sitting-balance support: No upper extremity supported, Feet supported Sitting balance-Leahy Scale: Good Sitting balance - Comments: tolerates lateral scooting well, with increased time   Standing balance support: Bilateral upper extremity supported, Single extremity supported, During functional activity Standing balance-Leahy Scale: Poor                              Communication Communication Communication: No apparent difficulties  Cognition Arousal: Alert Behavior During Therapy: WFL for tasks assessed/performed   PT - Cognitive impairments: No apparent impairments                       PT - Cognition Comments: improved following of cues for safety with mobility. Following commands: Intact      Cueing Cueing Techniques: Verbal cues, Tactile cues, Visual cues  Exercises      General Comments        Pertinent Vitals/Pain Pain Assessment Pain Assessment: Faces Faces Pain Scale: Hurts little more Pain Location: RLE Pain Descriptors / Indicators: Aching, Discomfort, Operative site guarding Pain Intervention(s): Limited activity within patient's tolerance, Monitored during session, Repositioned    Home Living                          Prior Function            PT Goals (current goals can now be found in the care plan section) Acute Rehab PT Goals PT Goal Formulation: With patient Time For Goal Achievement: 04/22/24 Potential to Achieve Goals: Good Progress towards PT goals: Progressing toward goals    Frequency    Min 2X/week      PT Plan      Co-evaluation              AM-PAC PT "6 Clicks" Mobility   Outcome Measure  Help needed turning from your back to your side while in a flat bed without using bedrails?: A Little Help needed moving from lying on your back to sitting on the side of a flat bed without using bedrails?: A Lot Help needed moving to and from a bed to a chair (including a wheelchair)?: A  Lot Help needed standing up from a chair using your arms (e.g., wheelchair or bedside chair)?: Total Help needed to walk in hospital room?: Total Help needed climbing 3-5 steps with a railing? : Total 6 Click Score: 10    End of Session Equipment Utilized During Treatment: Oxygen  Activity Tolerance: Patient tolerated treatment well Patient left: in chair;with call bell/phone within reach;with chair alarm set Nurse Communication: Mobility status;Need for lift equipment (slide board +2) PT Visit Diagnosis: Other abnormalities of gait and mobility (R26.89);Muscle weakness (generalized) (M62.81);Pain Pain - Right/Left: Right Pain - part of body: Ankle and joints of foot     Time: 1012-1039 PT Time Calculation (min) (ACUTE ONLY): 27 min  Charges:    $Therapeutic Activity: 23-37 mins PT General Charges $$ ACUTE PT VISIT: 1 Visit  Tish Forge, DPT Acute Rehabilitation Services Office [phone]  04/13/24 3:51 PM

## 2024-04-13 NOTE — Progress Notes (Addendum)
                  Subjective:   Summary:  63 yo F with PMH of insulin  dependent T2DM, COPD, CKD 2, L BKA in 2014, R TMA in 2024, resection of R fifth metatarsal on 4/8 due to osteomyelitis, readmitted for sepsis 2/2 post-op wound infection and osteomyelitis, s/p R BKA on 4/21  Patient's pain has been poorly controlled. She emotionally seems to be improving. She has significant concerns about going to a SNF after discharge due to previous negative experiences, though is open to be discharged to SNF.   Objective:  Vital signs in last 24 hours: Vitals:   04/12/24 2111 04/13/24 0558 04/13/24 0724 04/13/24 0811  BP: (!) 150/60 (!) 146/63 (!) 149/64   Pulse: 92 80 84   Resp: 20 20 16    Temp: 98.7 F (37.1 C) 98.4 F (36.9 C) 98.3 F (36.8 C)   TempSrc: Oral Oral Oral   SpO2: 95% 100% 92% 92%  Weight:      Height:       Supplemental O2: 2L  SpO2: 92 % O2 Flow Rate (L/min): 2 L/min  Physical Exam:  Constitutional: chronically ill appearing. In mild physical distress Psych: depressed mood Ext: R BKA stump in clean and dry dressing  Assessment/Plan:   63 yo F with L BKA, s/p R BKA d/t recurrent osteomyelitis. She will be extremely high risk for poor outcomes and readmission if she is discharged home. She is amenable to going to a SNF, hopefully this will provide the high level of care/rehab she will need  S/p R BKA d/t recurrent osteomyelitis Sepsis, resolved POD 2 from R BKA. Per VVS, dressing can be changed likely tomorrow. After some hesitation, she feels she would like to go to a SNF after discharge.  - TOC working on SNF authorization - Antibiotics discontinued today - Blood cultures NGTD - Scheduled Tylenol  1000mg  q8h, oxycodone  10mg  q4h prn, IV dilaudid  1mg  q3h prn, pregabalin  200mg  TID   T2DM Blood sugar moderately well-controlled from mid 100s to 200s - Semglee  40 units daily, mealtime Novolog  5 units, moderate SSI with meals and at bedtime  HTN: Normotensive on  home amlodipine  10mg  HLD, PAD: continue home rosuvastatin  20mg  Chronic back pain: continue home pregabalin  200mg  TID COPD: continue home Anoro Ellipta  Depression: continue home duloxetine  60mg  GERD: continue home pantoprazole  40mg  CKD2: stable, monitor with morning labs Urinary incontinence: continue home Myrbetriq  Gastroparesis: Reglan  10mg  qid  Diet: HH/CM VTE: Lovenox  Code: Full  Dispo: Anticipated discharge to Skilled nursing facility pending medical stability.   Jayson Michael, MD PGY-1 Internal Medicine Resident Pager Number [phone] Please contact the on call pager after 5 pm and on weekends at [phone].

## 2024-04-13 NOTE — Anesthesia Preprocedure Evaluation (Signed)
 Anesthesia Evaluation  Patient identified by MRN, date of birth, ID band Patient awake    Reviewed: Allergy & Precautions, NPO status , Patient's Chart, lab work & pertinent test results  Airway Mallampati: II  TM Distance: >3 FB Neck ROM: Full    Dental  (+) Dental Advisory Given   Pulmonary asthma , COPD, Current Smoker and Patient abstained from smoking.   breath sounds clear to auscultation       Cardiovascular hypertension, Pt. on medications and Pt. on home beta blockers + Peripheral Vascular Disease and +CHF   Rhythm:Regular     Neuro/Psych  Neuromuscular disease    GI/Hepatic Neg liver ROS,GERD  ,,  Endo/Other  diabetes, Type 2    Renal/GU CRFRenal disease     Musculoskeletal  (+) Arthritis ,  Fibromyalgia -  Abdominal   Peds  Hematology   Anesthesia Other Findings   Reproductive/Obstetrics                              Anesthesia Physical Anesthesia Plan  ASA: 3  Anesthesia Plan: MAC and Regional   Post-op Pain Management: Regional block*   Induction:   PONV Risk Score and Plan: 1 and Propofol  infusion  Airway Management Planned: Natural Airway and Simple Face Mask  Additional Equipment: None  Intra-op Plan:   Post-operative Plan:   Informed Consent: I have reviewed the patients History and Physical, chart, labs and discussed the procedure including the risks, benefits and alternatives for the proposed anesthesia with the patient or authorized representative who has indicated his/her understanding and acceptance.     Dental advisory given  Plan Discussed with: CRNA  Anesthesia Plan Comments:         Anesthesia Quick Evaluation
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
## 2024-04-13 NOTE — TOC Progression Note (Signed)
 Transition of Care [facility]) - Progression Note    Patient Details  Name: Jennifer [name] MRN: [id number] Date of Birth: Aug 10, 1961  Transition of [facility]) CM/SW Contact  Katrinka Parr, [place] Phone Number: 04/13/2024, 11:48 AM  Clinical Narrative:     CSW met with pt to discuss SNF. She is agreeable to SNF at this time. CSW explained SNF w/u process and insurance auth process. Fl2 completed and bed requests sent in hub. TOC to follow up with SNF offers.   Expected Discharge Plan: Skilled Nursing Facility Barriers to Discharge: Insurance Authorization  Expected Discharge Plan and Services   Discharge Planning Services: CM Consult Post Acute Care Choice:  (await post op PT/OT recommendations) Living arrangements for the past 2 months: Single Family Home                 DME Arranged:  (await post op PT/OT recommendations)         HH Arranged:  (await post op PT/OT recommendations)           Social Determinants of Health (SDOH) Interventions SDOH Screenings   Food Insecurity: No Food Insecurity (04/06/2024)  Housing: Low Risk  (04/06/2024)  Transportation Needs: No Transportation Needs (04/06/2024)  Recent Concern: Transportation Needs - Unmet Transportation Needs (03/24/2024)  Utilities: Not At Risk (04/06/2024)  Recent Concern: Utilities - At Risk (03/24/2024)  Alcohol Screen: Low Risk  (03/31/2023)  Depression (PHQ2-9): High Risk (10/08/2023)  Financial Resource Strain: High Risk (03/31/2023)  Physical Activity: Inactive (03/31/2023)  Social Connections: Socially Isolated (04/06/2024)  Stress: Stress Concern Present (03/31/2023)  Tobacco Use: High Risk (04/11/2024)    Readmission Risk Interventions    04/07/2024   12:00 PM 03/29/2024    1:06 PM 10/27/2021   10:18 AM  Readmission Risk Prevention Plan  Transportation Screening Complete Complete Complete  PCP or Specialist Appt within 3-5 Days   Complete  HRI or Home Care Consult  Complete Complete  Social Work Consult for Recovery  Care Planning/Counseling  Complete Complete  Palliative Care Screening  Not Applicable Not Applicable  Medication Review (RN Care Manager) Referral to Pharmacy Referral to Pharmacy Complete  PCP or Specialist appointment within 3-5 days of discharge Complete    HRI or Home Care Consult Complete    SW Recovery Care/Counseling Consult Complete

## 2024-04-14 ENCOUNTER — Inpatient Hospital Stay (HOSPITAL_COMMUNITY)

## 2024-04-14 ENCOUNTER — Encounter: Admitting: Podiatry

## 2024-04-14 DIAGNOSIS — E1151 Type 2 diabetes mellitus with diabetic peripheral angiopathy without gangrene: Secondary | ICD-10-CM | POA: Diagnosis not present

## 2024-04-14 DIAGNOSIS — M869 Osteomyelitis, unspecified: Secondary | ICD-10-CM | POA: Diagnosis not present

## 2024-04-14 DIAGNOSIS — E1169 Type 2 diabetes mellitus with other specified complication: Secondary | ICD-10-CM | POA: Diagnosis not present

## 2024-04-14 DIAGNOSIS — Z9889 Other specified postprocedural states: Secondary | ICD-10-CM | POA: Diagnosis not present

## 2024-04-14 LAB — GLUCOSE, CAPILLARY
Glucose-Capillary: 196 mg/dL — ABNORMAL HIGH (ref 70–99)
Glucose-Capillary: 190 mg/dL — ABNORMAL HIGH (ref 70–99)
Glucose-Capillary: 186 mg/dL — ABNORMAL HIGH (ref 70–99)
Glucose-Capillary: 260 mg/dL — ABNORMAL HIGH (ref 70–99)

## 2024-04-14 LAB — CBC
HCT: 25.4 % — ABNORMAL LOW (ref 36.0–46.0)
Hemoglobin: 7.9 g/dL — ABNORMAL LOW (ref 12.0–15.0)
MCH: 29 pg (ref 26.0–34.0)
MCHC: 31.1 g/dL (ref 30.0–36.0)
MCV: 93.4 fL (ref 80.0–100.0)
Platelets: 302 10*3/uL (ref 150–400)
RBC: 2.72 MIL/uL — ABNORMAL LOW (ref 3.87–5.11)
RDW: 14.1 % (ref 11.5–15.5)
WBC: 9.5 10*3/uL (ref 4.0–10.5)
nRBC: 0 % (ref 0.0–0.2)

## 2024-04-14 MED ORDER — SPIRONOLACTONE 25 MG PO TABS
50.0000 mg | ORAL_TABLET | Freq: Every day | ORAL | Status: DC
  Administered 2024-04-14 – 2024-04-18 (×5): 50 mg via ORAL
  Filled 2024-04-14 (×5): qty 2

## 2024-04-14 MED ORDER — INSULIN GLARGINE-YFGN 100 UNIT/ML ~~LOC~~ SOLN
65.0000 [IU] | Freq: Every day | SUBCUTANEOUS | Status: DC
  Administered 2024-04-15 – 2024-04-18 (×4): 65 [IU] via SUBCUTANEOUS
  Filled 2024-04-14 (×4): qty 0.65

## 2024-04-14 MED ORDER — HYDROMORPHONE HCL 2 MG PO TABS
2.0000 mg | ORAL_TABLET | ORAL | Status: DC | PRN
  Administered 2024-04-14 – 2024-04-16 (×9): 2 mg via ORAL
  Filled 2024-04-14 (×9): qty 1

## 2024-04-14 NOTE — Plan of Care (Signed)

## 2024-04-14 NOTE — Progress Notes (Signed)
 Occupational Therapy Treatment Patient Details Name: MILINA PAGETT MRN: [id number] DOB: 1961-05-26 Today's Date: 04/14/2024   History of present illness 63 yo female presents to [facility] on 4/15 with severe R foot pain. 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 Pt recently d/c from acute setting 4/10 s/p debridement of R 5th metatarsal resection, vascular surgery. 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 resection, vascular surgery. 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   OT comments  Pt progressing well towards goals. Progressed well to complete lateral scoot transfer with CGA and cueing for sequencing. Chair push ups and lateral leans to each side performed x5 reps to increase UB strength and improve balance needed for LB ADLs. Continue to recommend <3 hours of skilled rehab daily to optimize independence levels. Will continue to follow acutely.       If plan is discharge home, recommend the following:  A little help with walking and/or transfers;A lot of help with bathing/dressing/bathroom   Equipment Recommendations  Other (comment) (Drop arm bariatric BSC)    Recommendations for Other Services      Precautions / Restrictions Precautions Precautions: Fall Recall of Precautions/Restrictions: Intact Precaution/Restrictions Comments: B BKA Required Braces or Orthoses: Other Brace Other Brace: L BKA prosthesis Restrictions Weight Bearing Restrictions Per Provider Order: Yes RLE Weight Bearing Per Provider Order: Non weight bearing Other Position/Activity Restrictions: NWB RLE until BKA       Mobility Bed Mobility Overal bed mobility: Needs Assistance Bed  Mobility: Supine to Sit     Supine to sit: Supervision, HOB elevated, Used rails     General bed mobility comments: No assist, relied on bed features    Transfers Overall transfer level: Needs assistance Equipment used: None Transfers: Bed to chair/wheelchair/BSC            Lateral/Scoot Transfers: Contact guard assist General transfer comment: Cues for sequencing     Balance Overall balance assessment: Needs assistance Sitting-balance support: No upper extremity supported, Feet supported Sitting balance-Leahy Scale: Good     Standing balance support: Bilateral upper extremity supported, Single extremity supported, During functional activity Standing balance-Leahy Scale: Poor         ADL either performed or assessed with clinical judgement   ADL Overall ADL's : Needs assistance/impaired       Lower Body Dressing: Set up;Sitting/lateral leans Lower Body Dressing Details (indicate cue type and reason): Pt set up to don prosthesis Toilet Transfer: Contact guard assist;Cueing for sequencing Toilet Transfer Details (indicate cue type and reason): Cueing for sequencing, lateral scoot to chair         Functional mobility during ADLs: Contact guard assist (Lateral scoot) General ADL Comments: Thera ex for lateral leans to increase UB strength for LB ADLs    Extremity/Trunk Assessment Upper Extremity Assessment Upper Extremity Assessment: RUE deficits/detail;LUE deficits/detail;Overall WFL for tasks assessed RUE Deficits / Details: grossly WFLs, reports RA, RUE Sensation: history of peripheral neuropathy LUE Deficits / Details: grossly WFLs, reports RA LUE Sensation: history of peripheral neuropathy   Lower Extremity Assessment Lower Extremity Assessment: Defer to PT evaluation        Vision   Vision Assessment?: No apparent visual deficits         Communication Communication  Communication: No apparent difficulties   Cognition Arousal: Alert Behavior  During Therapy: WFL for tasks assessed/performed Cognition: No apparent impairments     Following commands: Intact        Cueing   Cueing Techniques: Verbal cues, Tactile cues, Visual cues  Exercises Exercises: Other exercises Other Exercises Other Exercises: Chair push ups x 5 reps Other Exercises: Lateral lean push ups R/L x5       General Comments VSS on RA    Pertinent Vitals/ Pain       Pain Assessment Pain Assessment: Faces Faces Pain Scale: Hurts little more Pain Location: RLE Pain Descriptors / Indicators: Aching, Discomfort, Operative site guarding Pain Intervention(s): Monitored during session   Frequency  Min 2X/week        Progress Toward Goals  OT Goals(current goals can now be found in the care plan section)  Progress towards OT goals: Progressing toward goals  Acute Rehab OT Goals Patient Stated Goal: To go home OT Goal Formulation: With patient Time For Goal Achievement: 04/23/24 Potential to Achieve Goals: Good ADL Goals Pt Will Perform Lower Body Bathing: with set-up;sitting/lateral leans;sit to/from stand Pt Will Perform Lower Body Dressing: with set-up;sitting/lateral leans Pt Will Transfer to Toilet: with contact guard assist;with transfer board (Drop arm bariatric BSC) Pt Will Perform Toileting - Clothing Manipulation and hygiene: with set-up;sitting/lateral leans  Plan         AM-PAC OT "6 Clicks" Daily Activity     Outcome Measure   Help from another person eating meals?: None Help from another person taking care of personal grooming?: A Little Help from another person toileting, which includes using toliet, bedpan, or urinal?: A Lot Help from another person bathing (including washing, rinsing, drying)?: A Little Help from another person to put on and taking off regular upper body clothing?: A Little Help from another person to put on and taking off regular lower body clothing?: A Lot 6 Click Score: 17    End of Session  Equipment Utilized During Treatment: Gait belt  OT Visit Diagnosis: Other abnormalities of gait and mobility (R26.89);Muscle weakness (generalized) (M62.81);Pain Pain - Right/Left: Right Pain - part of body:  (RLE)   Activity Tolerance Patient tolerated treatment well   Patient Left in chair;with call bell/phone within reach;with chair alarm set   Nurse Communication Mobility status        Time: 4098-1191 OT Time Calculation (min): 35 min  Charges: OT General Charges $OT Visit: 1 Visit OT Treatments $Self Care/Home Management : 8-22 mins $Therapeutic Activity: 8-22 mins  Delmer Ferraris, OT  Acute Rehabilitation Services Office [phone] Secure chat preferred   Mickael Alamo 04/14/2024, 2:16 PM

## 2024-04-14 NOTE — Progress Notes (Signed)
                  Subjective:   Summary:  63 yo F with PMH of insulin  dependent T2DM, COPD, CKD 2, L BKA in 2014, R TMA in 2024, resection of R fifth metatarsal on 4/8 due to osteomyelitis, readmitted for sepsis 2/2 post-op wound infection and osteomyelitis, s/p R BKA on 4/21  Patient is unsure about the list of possible SNF, she would like to discuss with her PCP. Pain has been moderately well controlled.   Objective:  Vital signs in last 24 hours: Vitals:   04/13/24 0811 04/13/24 1542 04/13/24 2113 04/14/24 0530  BP:  (!) 140/68 138/62 (!) 153/64  Pulse:  91 79 75  Resp:  16 18 18   Temp:  (!) 97.4 F (36.3 C) 98.5 F (36.9 C) 98.5 F (36.9 C)  TempSrc:  Oral Oral Oral  SpO2: 92% 97% 93% 99%  Weight:      Height:       Supplemental O2: 2L [place] SpO2: 99 % O2 Flow Rate (L/min): 1 L/min  Physical Exam:  Constitutional: chronically ill appearing. In mild physical distress Psych: depressed mood Lungs: mild crackles to bilateral lung bases Ext: R BKA stump in clean and dry dressing  Assessment/Plan:   S/p R BKA d/t recurrent osteomyelitis Sepsis, resolved POD 3 from R BKA. Per VVS, dressing can be changed likely tomorrow. Multiple offers for SNF beds have been made, though patient does have some hesitation to go to a SNF due to prior negative experiences. Will continue to encourage her to accept, as she will be very high risk for readmission if she is discharged home.  - Bed offers made for SNF, pending patient decision and authorization - Blood cultures NGTD. Off antibiotics - Scheduled Tylenol  1000mg  q8h, oxycodone  10mg  q4h prn, pregabalin  200mg  TID  - IV dilaudid  switched to oral dilaudid  2mg  q3h prn  Acute hypoxic respiratory failure Patient has required 1-2L [place] since her BKA. Patient has been occasionally using her incentive spirometer. Mild crackles auscultated on bilateral lung bases - CXR today  T2DM Blood sugar moderately well-controlled from mid 100s to 200s -  Semglee  60 units daily, mealtime Novolog  7 units, moderate SSI with meals and at bedtime  HTN Restarting home spironolactone  50mg  daily, continue amlodipine  10mg   HLD, PAD: continue home rosuvastatin  20mg  Chronic back pain: continue home pregabalin  200mg  TID COPD: continue home Anoro Ellipta  Depression: continue home duloxetine  60mg  GERD: continue home pantoprazole  40mg  CKD2: stable, monitor with morning labs Urinary incontinence: continue home Myrbetriq  Gastroparesis: Reglan  10mg  qid  Diet: HH/CM VTE: Lovenox  Code: Full  Dispo: Anticipated discharge to Skilled nursing facility pending medical stability.   Jayson Michael, MD PGY-1 Internal Medicine Resident Pager Number [phone] Please contact the on call pager after 5 pm and on weekends at [phone].

## 2024-04-14 NOTE — TOC Progression Note (Signed)
 Transition of Care [facility]) - Progression Note    Patient Details  Name: Jennifer [name] MRN: [id number] Date of Birth: August 19, 1961  [facility] of [facility]) CM/SW Contact  Katrinka Parr, [place] Phone Number: 04/14/2024, 11:42 AM  Clinical Narrative:     CSW met with pt again to discuss disposition/bed choice. Pt reiterates that she won't make a decision until speaking with Dr. Broadus Canes who she states is supposed to come this afternoon.   Expected Discharge Plan: Skilled Nursing Facility Barriers to Discharge: Insurance Authorization  Expected Discharge Plan and Services   Discharge Planning Services: CM Consult Post Acute Care Choice:  (await post op PT/OT recommendations) Living arrangements for the past 2 months: Single Family Home                 DME Arranged:  (await post op PT/OT recommendations)         HH Arranged:  (await post op PT/OT recommendations)           Social Determinants of Health (SDOH) Interventions SDOH Screenings   Food Insecurity: No Food Insecurity (04/06/2024)  Housing: Low Risk  (04/06/2024)  Transportation Needs: No Transportation Needs (04/06/2024)  Recent Concern: Transportation Needs - Unmet Transportation Needs (03/24/2024)  Utilities: Not At Risk (04/06/2024)  Recent Concern: Utilities - At Risk (03/24/2024)  Alcohol Screen: Low Risk  (03/31/2023)  Depression (PHQ2-9): High Risk (10/08/2023)  Financial Resource Strain: High Risk (03/31/2023)  Physical Activity: Inactive (03/31/2023)  Social Connections: Socially Isolated (04/06/2024)  Stress: Stress Concern Present (03/31/2023)  Tobacco Use: High Risk (04/11/2024)    Readmission Risk Interventions    04/07/2024   12:00 PM 03/29/2024    1:06 PM 10/27/2021   10:18 AM  Readmission Risk Prevention Plan  Transportation Screening Complete Complete Complete  PCP or Specialist Appt within 3-5 Days   Complete  HRI or Home Care Consult  Complete Complete  Social Work Consult for Recovery Care  Planning/Counseling  Complete Complete  Palliative Care Screening  Not Applicable Not Applicable  Medication Review (RN Care Manager) Referral to Pharmacy Referral to Pharmacy Complete  PCP or Specialist appointment within 3-5 days of discharge Complete    HRI or Home Care Consult Complete    SW Recovery Care/Counseling Consult Complete

## 2024-04-15 DIAGNOSIS — E1142 Type 2 diabetes mellitus with diabetic polyneuropathy: Secondary | ICD-10-CM

## 2024-04-15 DIAGNOSIS — E1169 Type 2 diabetes mellitus with other specified complication: Secondary | ICD-10-CM | POA: Diagnosis not present

## 2024-04-15 DIAGNOSIS — M869 Osteomyelitis, unspecified: Secondary | ICD-10-CM | POA: Diagnosis not present

## 2024-04-15 DIAGNOSIS — E1151 Type 2 diabetes mellitus with diabetic peripheral angiopathy without gangrene: Secondary | ICD-10-CM | POA: Diagnosis not present

## 2024-04-15 LAB — CBC
HCT: 24.1 % — ABNORMAL LOW (ref 36.0–46.0)
Hemoglobin: 7.5 g/dL — ABNORMAL LOW (ref 12.0–15.0)
MCH: 29.2 pg (ref 26.0–34.0)
MCHC: 31.1 g/dL (ref 30.0–36.0)
MCV: 93.8 fL (ref 80.0–100.0)
Platelets: 331 10*3/uL (ref 150–400)
RBC: 2.57 MIL/uL — ABNORMAL LOW (ref 3.87–5.11)
RDW: 14.1 % (ref 11.5–15.5)
WBC: 9 10*3/uL (ref 4.0–10.5)
nRBC: 0 % (ref 0.0–0.2)

## 2024-04-15 LAB — GLUCOSE, CAPILLARY
Glucose-Capillary: 212 mg/dL — ABNORMAL HIGH (ref 70–99)
Glucose-Capillary: 134 mg/dL — ABNORMAL HIGH (ref 70–99)
Glucose-Capillary: 137 mg/dL — ABNORMAL HIGH (ref 70–99)
Glucose-Capillary: 121 mg/dL — ABNORMAL HIGH (ref 70–99)

## 2024-04-15 MED ORDER — INSULIN ASPART 100 UNIT/ML IJ SOLN
10.0000 [IU] | Freq: Three times a day (TID) | INTRAMUSCULAR | Status: DC
Start: 2024-04-15 — End: 2024-04-18
  Administered 2024-04-15 – 2024-04-18 (×10): 10 [IU] via SUBCUTANEOUS

## 2024-04-15 NOTE — Progress Notes (Addendum)
 Msg sent to [facility]. Patient's spouse is requesting a one time dose of ativan  for patient apparently her cat is in the process of dying, and he is claiming she is extremely anxious. Patient looks like she's in pain, and pain meds just given not that long ago. MD prefers not to give anything sedating at this time, and wants to reevaluate in 1 hour.

## 2024-04-15 NOTE — Plan of Care (Signed)
  Problem: Education: Goal: Knowledge of General Education information will improve Description: Including pain rating scale, medication(s)/side effects and non-pharmacologic comfort measures Outcome: Progressing   Problem: Health Behavior/Discharge Planning: Goal: Ability to manage health-related needs will improve Outcome: Progressing   Problem: Clinical Measurements: Goal: Ability to maintain clinical measurements within normal limits will improve Outcome: Progressing Goal: Will remain free from infection Outcome: Progressing Goal: Diagnostic test results will improve Outcome: Progressing Goal: Respiratory complications will improve Outcome: Progressing Goal: Cardiovascular complication will be avoided Outcome: Progressing   Problem: Activity: Goal: Risk for activity intolerance will decrease Outcome: Progressing   Problem: Nutrition: Goal: Adequate nutrition will be maintained Outcome: Progressing   Problem: Coping: Goal: Level of anxiety will decrease Outcome: Progressing   Problem: Elimination: Goal: Will not experience complications related to bowel motility Outcome: Progressing Goal: Will not experience complications related to urinary retention Outcome: Progressing   Problem: Pain Managment: Goal: General experience of comfort will improve and/or be controlled Outcome: Progressing   Problem: Safety: Goal: Ability to remain free from injury will improve Outcome: Progressing   Problem: Skin Integrity: Goal: Risk for impaired skin integrity will decrease Outcome: Progressing   Problem: Education: Goal: Ability to describe self-care measures that may prevent or decrease complications (Diabetes Survival Skills Education) will improve Outcome: Progressing Goal: Individualized Educational Video(s) Outcome: Progressing   Problem: Coping: Goal: Ability to adjust to condition or change in health will improve Outcome: Progressing   Problem: Fluid  Volume: Goal: Ability to maintain a balanced intake and output will improve Outcome: Progressing   Problem: Health Behavior/Discharge Planning: Goal: Ability to identify and utilize available resources and services will improve Outcome: Progressing Goal: Ability to manage health-related needs will improve Outcome: Progressing   Problem: Metabolic: Goal: Ability to maintain appropriate glucose levels will improve Outcome: Progressing   Problem: Nutritional: Goal: Maintenance of adequate nutrition will improve Outcome: Progressing Goal: Progress toward achieving an optimal weight will improve Outcome: Progressing   Problem: Skin Integrity: Goal: Risk for impaired skin integrity will decrease Outcome: Progressing   Problem: Tissue Perfusion: Goal: Adequacy of tissue perfusion will improve Outcome: Progressing   Problem: Education: Goal: Knowledge of the prescribed therapeutic regimen will improve Outcome: Progressing Goal: Ability to verbalize activity precautions or restrictions will improve Outcome: Progressing Goal: Understanding of discharge needs will improve Outcome: Progressing   Problem: Activity: Goal: Ability to perform//tolerate increased activity and mobilize with assistive devices will improve Outcome: Progressing   Problem: Clinical Measurements: Goal: Postoperative complications will be avoided or minimized Outcome: Progressing   Problem: Self-Care: Goal: Ability to meet self-care needs will improve Outcome: Progressing   Problem: Self-Concept: Goal: Ability to maintain and perform role responsibilities to the fullest extent possible will improve Outcome: Progressing   Problem: Pain Management: Goal: Pain level will decrease with appropriate interventions Outcome: Progressing   Problem: Skin Integrity: Goal: Demonstration of wound healing without infection will improve Outcome: Progressing

## 2024-04-15 NOTE — Progress Notes (Signed)
 [facility]  Subjective  -having some pain in her right BKA   Objective (!) 143/53 77 98.7 F (37.1 C) (Oral) 16 92%  Intake/Output Summary (Last 24 hours) at 04/15/2024 0758 Last data filed at 04/15/2024 0700 Gross per 24 hour  Intake 360 ml  Output 1200 ml  Net -840 ml    Right BKA looks great with staples intact and dressing removed  Laboratory Lab Results: Recent Labs    04/14/24 0708 04/15/24 0340  WBC 9.5 9.0  HGB 7.9* 7.5*  HCT 25.4* 24.1*  PLT 302 331   BMET Recent Labs    04/13/24 0645  CREATININE 0.84    COAG Lab Results  Component Value Date   INR 1.3 (H) 04/05/2024   INR 1.1 10/31/2023   INR 1.1 02/10/2023   No results found for: "PTT"  Assessment/Planning:  Status post right BKA on 04/11/2024.  Dressing removed at bedside.  Amputation looks great.  Will arrange follow-up for staple removal in 3-4 weeks.  Young Hensen 04/15/2024 7:58 AM --

## 2024-04-15 NOTE — TOC Progression Note (Signed)
 Transition of Care [facility]) - Progression Note    Patient Details  Name: Jennifer [name] MRN: [id number] Date of Birth: 1961/04/27  [facility]) CM/SW Contact  Katrinka Parr, [place] Phone Number: 04/15/2024, 10:27 AM  Clinical Narrative:     CSW met with pt for SNF choice; Pt chooses [facility].   CSW contacted Tammy with [facility] to confirm bed offer though they are not sure when a female bed will be open. Willl check bed availability later in the day  1210: Notified by Tammy that [facility] is working on figuring out bed availability. Plan to initiated insurance auth request. TOC to contact Christy tomorrow morning for bed availability([phone])  Auth request submitted in online portal.Ref# [id number] Expected Discharge Plan: Skilled Nursing Facility Barriers to Discharge: Insurance Authorization  Expected Discharge Plan and Services   Discharge Planning Services: CM Consult Post Acute Care Choice:  (await post op PT/OT recommendations) Living arrangements for the past 2 months: Single Family Home                 DME Arranged:  (await post op PT/OT recommendations)         HH Arranged:  (await post op PT/OT recommendations)           Social Determinants of Health (SDOH) Interventions SDOH Screenings   Food Insecurity: No Food Insecurity (04/06/2024)  Housing: Low Risk  (04/06/2024)  Transportation Needs: No Transportation Needs (04/06/2024)  Recent Concern: Transportation Needs - Unmet Transportation Needs (03/24/2024)  Utilities: Not At Risk (04/06/2024)  Recent Concern: Utilities - At Risk (03/24/2024)  Alcohol Screen: Low Risk  (03/31/2023)  Depression (PHQ2-9): High Risk (10/08/2023)  Financial Resource Strain: High Risk (03/31/2023)  Physical Activity: Inactive (03/31/2023)  Social Connections: Socially Isolated (04/06/2024)  Stress: Stress Concern Present (03/31/2023)  Tobacco Use: High Risk (04/11/2024)    Readmission Risk Interventions    04/07/2024    12:00 PM 03/29/2024    1:06 PM 10/27/2021   10:18 AM  Readmission Risk Prevention Plan  Transportation Screening Complete Complete Complete  PCP or Specialist Appt within 3-5 Days   Complete  HRI or Home Care Consult  Complete Complete  Social Work Consult for Recovery Care Planning/Counseling  Complete Complete  Palliative Care Screening  Not Applicable Not Applicable  Medication Review (RN Care Manager) Referral to Pharmacy Referral to Pharmacy Complete  PCP or Specialist appointment within 3-5 days of discharge Complete    HRI or Home Care Consult Complete    SW Recovery Care/Counseling Consult Complete

## 2024-04-15 NOTE — Progress Notes (Signed)
 Physical Therapy Treatment Patient Details Name: Jennifer [name] MRN: [id number] DOB: 23-Aug-1961 Today's Date: 04/15/2024   History of Present Illness 63 yo female presents to [facility] on 4/15 with severe R foot pain. 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 Pt recently d/c from acute setting 4/10 s/p debridement of R 5th metatarsal resection, vascular surgery. 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 resection, vascular surgery. 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    PT Comments  Pt endorsing significant R residual limb pain, agreeable to dangle EOB and BKA exercises. Pt moving to/from EOB with supervision, requires sequencing cues but is resistant to assist given RLE pain. Very increased time for mobility this date. Pt tolerating BKA exercises well. Plan is now for SNF, which is appropriate.     If plan is discharge home, recommend the following: A little help with bathing/dressing/bathroom;Assistance with cooking/housework;Assist for transportation;Help with stairs or ramp for entrance;A lot of help with walking and/or transfers   Can travel by private vehicle        Equipment Recommendations  Wheelchair (measurements PT);Wheelchair cushion (measurements PT);Other (comment) (tub bench, also asking for power w/c to be serviced)    Recommendations for Other Services       Precautions / Restrictions Precautions Precautions: Fall Recall of Precautions/Restrictions: Intact Precaution/Restrictions Comments: B BKA Required Braces or Orthoses: Other Brace Other Brace: L BKA prosthesis Restrictions Weight Bearing Restrictions Per Provider Order: Yes RLE Weight Bearing Per  Provider Order: Non weight bearing Other Position/Activity Restrictions: NWB RLE until BKA     Mobility  Bed Mobility Overal bed mobility: Needs Assistance Bed Mobility: Supine to Sit, Sit to Supine     Supine to sit: Supervision Sit to supine: Supervision   General bed mobility comments: for safety, use of HOB elevation and cues for sequencing    Transfers                   General transfer comment: declines transfer OOB    Ambulation/Gait                   Stairs             Wheelchair Mobility     Tilt Bed    Modified Rankin (Stroke Patients Only)       Balance Overall balance assessment: Needs assistance Sitting-balance support: No upper extremity supported, Feet supported Sitting balance-Leahy Scale: Good                                      Communication Communication Communication: No apparent difficulties  Cognition Arousal: Alert Behavior During Therapy: WFL for tasks assessed/performed   PT - Cognitive impairments: No apparent impairments                         Following commands: Intact      Cueing Cueing Techniques: Verbal cues, Tactile cues, Visual cues  Exercises Amputee Exercises Gluteal Sets: AROM, Both, 15 reps, Seated Hip ABduction/ADduction: AROM, Right, 15 reps, Seated Knee Extension: AROM, Right, 15 reps, Seated    General Comments  Pertinent Vitals/Pain Pain Assessment Pain Assessment: Faces Faces Pain Scale: Hurts even more Pain Location: RLE Pain Descriptors / Indicators: Aching, Discomfort, Operative site guarding Pain Intervention(s): Monitored during session, Repositioned, Limited activity within patient's tolerance    Home Living                          Prior Function            PT Goals (current goals can now be found in the care plan section) Acute Rehab PT Goals PT Goal Formulation: With patient Time For Goal Achievement:  04/22/24 Potential to Achieve Goals: Good Progress towards PT goals: Progressing toward goals    Frequency    Min 2X/week      PT Plan      Co-evaluation              AM-PAC PT "6 Clicks" Mobility   Outcome Measure  Help needed turning from your back to your side while in a flat bed without using bedrails?: A Little Help needed moving from lying on your back to sitting on the side of a flat bed without using bedrails?: A Little Help needed moving to and from a bed to a chair (including a wheelchair)?: A Lot Help needed standing up from a chair using your arms (e.g., wheelchair or bedside chair)?: Total Help needed to walk in hospital room?: Total Help needed climbing 3-5 steps with a railing? : Total 6 Click Score: 11    End of Session   Activity Tolerance: Patient tolerated treatment well Patient left: with call bell/phone within reach;in bed;with bed alarm set Nurse Communication: Mobility status PT Visit Diagnosis: Other abnormalities of gait and mobility (R26.89);Muscle weakness (generalized) (M62.81);Pain Pain - Right/Left: Right Pain - part of body: Leg     Time: 2956-2130 PT Time Calculation (min) (ACUTE ONLY): 23 min  Charges:    $Therapeutic Exercise: 8-22 mins $Therapeutic Activity: 8-22 mins PT General Charges $$ ACUTE PT VISIT: 1 Visit                     Shirlene Doughty, PT DPT Acute Rehabilitation Services Secure Chat Preferred  Office [phone]    Camran Keady E Burnadette Carrion 04/15/2024, 4:40 PM

## 2024-04-15 NOTE — Progress Notes (Signed)
                  Subjective:   Summary:  63 yo F with PMH of insulin  dependent T2DM, COPD, CKD 2, L BKA in 2014, R TMA in 2024, resection of R fifth metatarsal on 4/8 due to osteomyelitis, readmitted for sepsis 2/2 post-op wound infection and osteomyelitis, s/p R BKA on 4/21  Patient feels much better about going to [facility] after discussing with her PCP Dr Broadus Canes. She is having significant pain after having her dressing removed. Discussed her prn pain medications and that she can receive more if needed. Pending discharge to SNF  Objective:  Vital signs in last 24 hours: Vitals:   04/14/24 2055 04/15/24 0710 04/15/24 0725 04/15/24 0826  BP: (!) 140/61 (!) 131/90 (!) 143/53   Pulse: 82 69 77   Resp: 17 16 16    Temp: 98.6 F (37 C) 98.8 F (37.1 C) 98.7 F (37.1 C)   TempSrc: Oral Oral Oral   SpO2: 99% (!) 85% 92% 91%  Weight:      Height:       Supplemental O2: 2L [place] SpO2: 91 % O2 Flow Rate (L/min): 1 L/min  Physical Exam:  Constitutional: chronically ill appearing. In mild physical distress Ext: R BKA stump in clean and dry dressing  Assessment/Plan:   S/p R BKA d/t recurrent osteomyelitis Sepsis, resolved POD 4 from R BKA. Patient has accepted bed offer to [facility], however per update from [facility] they may not have a bed available today. Awaiting updates. Per [facility], she will need follow up in 3-4 weeks for staple removal.  - Pending discharge to SNF - Blood cultures NGTD. Off antibiotics - Scheduled Tylenol  1000mg  q8h, oral dilaudid  2mg  q3h prn, pregabalin  200mg  TID   Acute hypoxic respiratory failure, improved CXR showing small atelectasis to bilateral lung bases, consistent with immobility and post-surgical changes. Incentive spirometer encouraged. Intermittently requiring 1L or room air  T2DM Blood sugar moderately well-controlled from mid 100s to 200s - Semglee  60 units daily, mealtime Novolog  7 units, moderate SSI with meals and at bedtime  HTN: home  spironolactone  50mg  daily, amlodipine  10mg  HLD, PAD: continue home rosuvastatin  20mg  Chronic back pain: continue home pregabalin  200mg  TID COPD: continue home Anoro Ellipta  Depression: continue home duloxetine  60mg  GERD: continue home pantoprazole  40mg  CKD2: stable, monitor with morning labs Urinary incontinence: continue home Myrbetriq  Gastroparesis: Reglan  10mg  qid  Diet: HH/CM VTE: Lovenox  Code: Full  Dispo: Anticipated discharge to Skilled nursing facility pending medical stability.   Jayson Michael, MD PGY-1 Internal Medicine Resident Pager Number [phone] Please contact the on call pager after 5 pm and on weekends at [phone].

## 2024-04-15 NOTE — Plan of Care (Signed)
 Problem: Education: Goal: Knowledge of General Education information will improve Description: Including pain rating scale, medication(s)/side effects and non-pharmacologic comfort measures 04/15/2024 1735 by Alphonsa Brickle B, RN Outcome: Progressing 04/15/2024 1735 by Marijo Shove, RN Outcome: Progressing   Problem: Health Behavior/Discharge Planning: Goal: Ability to manage health-related needs will improve 04/15/2024 1735 by Marijo Shove, RN Outcome: Progressing 04/15/2024 1735 by Marijo Shove, RN Outcome: Progressing   Problem: Clinical Measurements: Goal: Ability to maintain clinical measurements within normal limits will improve 04/15/2024 1735 by Marijo Shove, RN Outcome: Progressing 04/15/2024 1735 by Marijo Shove, RN Outcome: Progressing Goal: Will remain free from infection 04/15/2024 1735 by Marijo Shove, RN Outcome: Progressing 04/15/2024 1735 by Marijo Shove, RN Outcome: Progressing Goal: Diagnostic test results will improve 04/15/2024 1735 by Marijo Shove, RN Outcome: Progressing 04/15/2024 1735 by Marijo Shove, RN Outcome: Progressing Goal: Respiratory complications will improve 04/15/2024 1735 by Marijo Shove, RN Outcome: Progressing 04/15/2024 1735 by Marijo Shove, RN Outcome: Progressing Goal: Cardiovascular complication will be avoided 04/15/2024 1735 by Marijo Shove, RN Outcome: Progressing 04/15/2024 1735 by Marijo Shove, RN Outcome: Progressing   Problem: Activity: Goal: Risk for activity intolerance will decrease 04/15/2024 1735 by Marijo Shove, RN Outcome: Progressing 04/15/2024 1735 by Marijo Shove, RN Outcome: Progressing   Problem: Nutrition: Goal: Adequate nutrition will be maintained 04/15/2024 1735 by Marijo Shove, RN Outcome: Progressing 04/15/2024 1735 by Marijo Shove, RN Outcome: Progressing   Problem: Coping: Goal: Level of anxiety will decrease 04/15/2024 1735 by Marijo Shove, RN Outcome:  Progressing 04/15/2024 1735 by Marijo Shove, RN Outcome: Progressing   Problem: Elimination: Goal: Will not experience complications related to bowel motility 04/15/2024 1735 by Marijo Shove, RN Outcome: Progressing 04/15/2024 1735 by Marijo Shove, RN Outcome: Progressing Goal: Will not experience complications related to urinary retention 04/15/2024 1735 by Marijo Shove, RN Outcome: Progressing 04/15/2024 1735 by Marijo Shove, RN Outcome: Progressing   Problem: Pain Managment: Goal: General experience of comfort will improve and/or be controlled 04/15/2024 1735 by Marijo Shove, RN Outcome: Progressing 04/15/2024 1735 by Marijo Shove, RN Outcome: Progressing   Problem: Safety: Goal: Ability to remain free from injury will improve 04/15/2024 1735 by Marijo Shove, RN Outcome: Progressing 04/15/2024 1735 by Marijo Shove, RN Outcome: Progressing   Problem: Skin Integrity: Goal: Risk for impaired skin integrity will decrease 04/15/2024 1735 by Marijo Shove, RN Outcome: Progressing 04/15/2024 1735 by Marijo Shove, RN Outcome: Progressing   Problem: Education: Goal: Ability to describe self-care measures that may prevent or decrease complications (Diabetes Survival Skills Education) will improve 04/15/2024 1735 by Marijo Shove, RN Outcome: Progressing 04/15/2024 1735 by Marijo Shove, RN Outcome: Progressing Goal: Individualized Educational Video(s) 04/15/2024 1735 by Marijo Shove, RN Outcome: Progressing 04/15/2024 1735 by Marijo Shove, RN Outcome: Progressing   Problem: Coping: Goal: Ability to adjust to condition or change in health will improve 04/15/2024 1735 by Marijo Shove, RN Outcome: Progressing 04/15/2024 1735 by Marijo Shove, RN Outcome: Progressing   Problem: Fluid Volume: Goal: Ability to maintain a balanced intake and output will improve 04/15/2024 1735 by Marijo Shove, RN Outcome: Progressing 04/15/2024 1735 by  Marijo Shove, RN Outcome: Progressing   Problem: Health Behavior/Discharge Planning: Goal: Ability to identify and utilize available resources and services will improve 04/15/2024 1735 by Marijo Shove, RN Outcome: Progressing 04/15/2024 1735 by  Daleisa Halperin B, RN Outcome: Progressing Goal: Ability to manage health-related needs will improve 04/15/2024 1735 by Marijo Shove, RN Outcome: Progressing 04/15/2024 1735 by Marijo Shove, RN Outcome: Progressing   Problem: Metabolic: Goal: Ability to maintain appropriate glucose levels will improve 04/15/2024 1735 by Marijo Shove, RN Outcome: Progressing 04/15/2024 1735 by Marijo Shove, RN Outcome: Progressing   Problem: Nutritional: Goal: Maintenance of adequate nutrition will improve 04/15/2024 1735 by Marijo Shove, RN Outcome: Progressing 04/15/2024 1735 by Marijo Shove, RN Outcome: Progressing Goal: Progress toward achieving an optimal weight will improve 04/15/2024 1735 by Marijo Shove, RN Outcome: Progressing 04/15/2024 1735 by Marijo Shove, RN Outcome: Progressing   Problem: Skin Integrity: Goal: Risk for impaired skin integrity will decrease 04/15/2024 1735 by Marijo Shove, RN Outcome: Progressing 04/15/2024 1735 by Marijo Shove, RN Outcome: Progressing   Problem: Tissue Perfusion: Goal: Adequacy of tissue perfusion will improve 04/15/2024 1735 by Marijo Shove, RN Outcome: Progressing 04/15/2024 1735 by Marijo Shove, RN Outcome: Progressing   Problem: Education: Goal: Knowledge of the prescribed therapeutic regimen will improve 04/15/2024 1735 by Marijo Shove, RN Outcome: Progressing 04/15/2024 1735 by Marijo Shove, RN Outcome: Progressing Goal: Ability to verbalize activity precautions or restrictions will improve 04/15/2024 1735 by Marijo Shove, RN Outcome: Progressing 04/15/2024 1735 by Marijo Shove, RN Outcome: Progressing Goal: Understanding of discharge needs will  improve 04/15/2024 1735 by Marijo Shove, RN Outcome: Progressing 04/15/2024 1735 by Marijo Shove, RN Outcome: Progressing   Problem: Activity: Goal: Ability to perform//tolerate increased activity and mobilize with assistive devices will improve 04/15/2024 1735 by Marijo Shove, RN Outcome: Progressing 04/15/2024 1735 by Marijo Shove, RN Outcome: Progressing   Problem: Clinical Measurements: Goal: Postoperative complications will be avoided or minimized 04/15/2024 1735 by Marijo Shove, RN Outcome: Progressing 04/15/2024 1735 by Marijo Shove, RN Outcome: Progressing   Problem: Self-Care: Goal: Ability to meet self-care needs will improve 04/15/2024 1735 by Marijo Shove, RN Outcome: Progressing 04/15/2024 1735 by Marijo Shove, RN Outcome: Progressing   Problem: Self-Concept: Goal: Ability to maintain and perform role responsibilities to the fullest extent possible will improve 04/15/2024 1735 by Marijo Shove, RN Outcome: Progressing 04/15/2024 1735 by Marijo Shove, RN Outcome: Progressing   Problem: Pain Management: Goal: Pain level will decrease with appropriate interventions 04/15/2024 1735 by Marijo Shove, RN Outcome: Progressing 04/15/2024 1735 by Marijo Shove, RN Outcome: Progressing   Problem: Skin Integrity: Goal: Demonstration of wound healing without infection will improve 04/15/2024 1735 by Marijo Shove, RN Outcome: Progressing 04/15/2024 1735 by Marijo Shove, RN Outcome: Progressing

## 2024-04-16 DIAGNOSIS — M869 Osteomyelitis, unspecified: Secondary | ICD-10-CM | POA: Diagnosis not present

## 2024-04-16 DIAGNOSIS — E1169 Type 2 diabetes mellitus with other specified complication: Secondary | ICD-10-CM | POA: Diagnosis not present

## 2024-04-16 DIAGNOSIS — Z9889 Other specified postprocedural states: Secondary | ICD-10-CM | POA: Diagnosis not present

## 2024-04-16 DIAGNOSIS — E1151 Type 2 diabetes mellitus with diabetic peripheral angiopathy without gangrene: Secondary | ICD-10-CM | POA: Diagnosis not present

## 2024-04-16 LAB — GLUCOSE, CAPILLARY
Glucose-Capillary: 184 mg/dL — ABNORMAL HIGH (ref 70–99)
Glucose-Capillary: 168 mg/dL — ABNORMAL HIGH (ref 70–99)
Glucose-Capillary: 133 mg/dL — ABNORMAL HIGH (ref 70–99)
Glucose-Capillary: 106 mg/dL — ABNORMAL HIGH (ref 70–99)

## 2024-04-16 MED ORDER — HYDROMORPHONE HCL 2 MG PO TABS
4.0000 mg | ORAL_TABLET | ORAL | Status: DC | PRN
  Administered 2024-04-16 – 2024-04-18 (×8): 4 mg via ORAL
  Filled 2024-04-16 (×8): qty 2

## 2024-04-16 NOTE — TOC Progression Note (Signed)
 Transition of Care [facility]) - Progression Note    Patient Details  Name: Jennifer [name] MRN: [id number] Date of Birth: 06-16-1961  Transition of Care [facility] CM/SW Contact  Adline Hook, [place] Phone Number: 04/16/2024, 10:47 AM  Clinical Narrative:     [facility] not [facility] a bed for patient today. CSW will check again tomorrow.   Expected Discharge Plan: Skilled Nursing Facility Barriers to Discharge: Insurance Authorization  Expected Discharge Plan and Services   Discharge Planning Services: CM Consult Post Acute Care Choice:  (await post op PT/OT recommendations) Living arrangements for the past 2 months: Single Family Home                 DME Arranged:  (await post op PT/OT recommendations)         HH Arranged:  (await post op PT/OT recommendations)           Social Determinants of Health (SDOH) Interventions SDOH Screenings   Food Insecurity: No Food Insecurity (04/06/2024)  Housing: Low Risk  (04/06/2024)  Transportation Needs: No Transportation Needs (04/06/2024)  Recent Concern: Transportation Needs - Unmet Transportation Needs (03/24/2024)  Utilities: Not At Risk (04/06/2024)  Recent Concern: Utilities - At Risk (03/24/2024)  Alcohol Screen: Low Risk  (03/31/2023)  Depression (PHQ2-9): High Risk (10/08/2023)  Financial Resource Strain: High Risk (03/31/2023)  Physical Activity: Inactive (03/31/2023)  Social Connections: Socially Isolated (04/06/2024)  Stress: Stress Concern Present (03/31/2023)  Tobacco Use: High Risk (04/11/2024)    Readmission Risk Interventions    04/07/2024   12:00 PM 03/29/2024    1:06 PM 10/27/2021   10:18 AM  Readmission Risk Prevention Plan  Transportation Screening Complete Complete Complete  PCP or Specialist Appt within 3-5 Days   Complete  HRI or Home Care Consult  Complete Complete  Social Work Consult for Recovery Care Planning/Counseling  Complete Complete  Palliative Care Screening  Not Applicable Not Applicable  Medication  Review (RN Care Manager) Referral to Pharmacy Referral to Pharmacy Complete  PCP or Specialist appointment within 3-5 days of discharge Complete    HRI or Home Care Consult Complete    SW Recovery Care/Counseling Consult Complete

## 2024-04-16 NOTE — Progress Notes (Signed)
                  Subjective:   Summary:  63 yo F with PMH of insulin  dependent T2DM, COPD, CKD 2, L BKA in 2014, R TMA in 2024, resection of R fifth metatarsal on 4/8 due to osteomyelitis, readmitted for sepsis 2/2 post-op wound infection and osteomyelitis, s/p R BKA on 4/21  Patient reports her pain has been poorly controlled. She is still ready to go to [facility] whenever a bed becomes available. No new issues  Objective:  Vital signs in last 24 hours: Vitals:   04/15/24 0826 04/15/24 1544 04/15/24 2205 04/16/24 0510  BP:  (!) 118/50 (!) 138/57 125/71  Pulse:  84 78 80  Resp:  16 18 18   Temp:  98.3 F (36.8 C) 99.1 F (37.3 C) 98.6 F (37 C)  TempSrc:  Oral Oral Oral  SpO2: 91% 96% 98% 94%  Weight:      Height:       Supplemental O2: 2L [place] SpO2: 94 % O2 Flow Rate (L/min): 1 L/min  Physical Exam:  Constitutional: chronically ill appearing. In mild physical distress Ext: R BKA stump with staples intact. Wound appears clean and dry, no drainage or significant erythema  Assessment/Plan:   S/p R BKA d/t recurrent osteomyelitis Sepsis, resolved POD 5 from R BKA. Stable for discharge to [facility] when a bed becomes available.  - Pending discharge to SNF - Blood cultures NGTD. Off antibiotics - Oral dilaudid  increased to 4mg  q4h. Scheduled Tylenol  1000mg  q8h, pregabalin  200mg  TID   Acute hypoxic respiratory failure, improved CXR showing small atelectasis to bilateral lung bases, consistent with immobility and post-surgical changes. Incentive spirometer encouraged.  T2DM Blood sugar moderately well-controlled from mid 100s to 200s - Semglee  60 units daily, mealtime Novolog  7 units, moderate SSI with meals and at bedtime  HTN: home spironolactone  50mg  daily, amlodipine  10mg  HLD, PAD: continue home rosuvastatin  20mg  Chronic back pain: continue home pregabalin  200mg  TID COPD: continue home Anoro Ellipta  Depression: continue home duloxetine  60mg  GERD: continue  home pantoprazole  40mg  CKD2: stable, monitor with morning labs Urinary incontinence: continue home Myrbetriq  Gastroparesis: Reglan  10mg  qid  Diet: HH/CM VTE: Lovenox  Code: Full  Dispo: Anticipated discharge to Skilled nursing facility pending medical stability.   Jayson Michael, MD PGY-1 Internal Medicine Resident Pager Number [phone] Please contact the on call pager after 5 pm and on weekends at [phone].

## 2024-04-17 DIAGNOSIS — E1169 Type 2 diabetes mellitus with other specified complication: Secondary | ICD-10-CM | POA: Diagnosis not present

## 2024-04-17 DIAGNOSIS — Z9889 Other specified postprocedural states: Secondary | ICD-10-CM | POA: Diagnosis not present

## 2024-04-17 DIAGNOSIS — E1151 Type 2 diabetes mellitus with diabetic peripheral angiopathy without gangrene: Secondary | ICD-10-CM | POA: Diagnosis not present

## 2024-04-17 DIAGNOSIS — M869 Osteomyelitis, unspecified: Secondary | ICD-10-CM | POA: Diagnosis not present

## 2024-04-17 LAB — GLUCOSE, CAPILLARY
Glucose-Capillary: 125 mg/dL — ABNORMAL HIGH (ref 70–99)
Glucose-Capillary: 182 mg/dL — ABNORMAL HIGH (ref 70–99)
Glucose-Capillary: 228 mg/dL — ABNORMAL HIGH (ref 70–99)
Glucose-Capillary: 198 mg/dL — ABNORMAL HIGH (ref 70–99)

## 2024-04-17 NOTE — Progress Notes (Signed)
                  Subjective:   Summary:  63 yo F with PMH of insulin  dependent T2DM, COPD, CKD 2, L BKA in 2014, R TMA in 2024, resection of R fifth metatarsal on 4/8 due to osteomyelitis, readmitted for sepsis 2/2 post-op wound infection and osteomyelitis, s/p R BKA on 4/21  Pain has been well controlled with increased regimen. Continue awaiting bed from [facility]  Objective:  Vital signs in last 24 hours: Vitals:   04/16/24 0826 04/16/24 1636 04/16/24 2042 04/17/24 0421  BP: 132/66 (!) 135/57 (!) 134/57 129/64  Pulse: 75 78 78 82  Resp: 17 17 17 18   Temp: 98.6 F (37 C) 98.8 F (37.1 C) 98.3 F (36.8 C) 98 F (36.7 C)  TempSrc: Oral Oral Oral Oral  SpO2: 95% 94% 93% 92%  Weight:      Height:       Supplemental O2: 2L [place] SpO2: 92 % O2 Flow Rate (L/min): 1 L/min  Physical Exam:  Constitutional: chronically ill appearing. No acute distress Ext: R BKA stump with staples intact. Wound appears clean and dry, no drainage or significant erythema  Assessment/Plan:   S/p R BKA d/t recurrent osteomyelitis Sepsis, resolved S/p R BKA on 4/21. Stable for discharge to [facility] when a bed becomes available.  - Pending discharge to SNF - Blood cultures NGTD. Off antibiotics - Oral dilaudid  4mg  q4h, Scheduled Tylenol  1000mg  q8h, pregabalin  200mg  TID   T2DM Blood sugar moderately well-controlled from mid 100s to 200s - Semglee  60 units daily, mealtime Novolog  7 units, moderate SSI with meals and at bedtime  Acute hypoxic respiratory failure, improved: CXR shows atelectasis. Encourage incentive spirometer HTN: home spironolactone  50mg  daily, amlodipine  10mg  HLD, PAD: continue home rosuvastatin  20mg  Chronic back pain: continue home pregabalin  200mg  TID COPD: continue home Anoro Ellipta  Depression: continue home duloxetine  60mg  GERD: continue home pantoprazole  40mg  CKD2: stable, monitor with morning labs Urinary incontinence: continue home Myrbetriq  Gastroparesis:  Reglan  10mg  qid  Diet: HH/CM VTE: Lovenox  Code: Full  Dispo: Anticipated discharge to Skilled nursing facility pending medical stability.   Jennifer Michael, MD PGY-1 Internal Medicine Resident Pager Number [phone] Please contact the on call pager after 5 pm and on weekends at [phone].

## 2024-04-18 DIAGNOSIS — E1169 Type 2 diabetes mellitus with other specified complication: Secondary | ICD-10-CM | POA: Diagnosis not present

## 2024-04-18 DIAGNOSIS — Z7189 Other specified counseling: Secondary | ICD-10-CM | POA: Diagnosis not present

## 2024-04-18 DIAGNOSIS — E119 Type 2 diabetes mellitus without complications: Secondary | ICD-10-CM | POA: Diagnosis not present

## 2024-04-18 DIAGNOSIS — G894 Chronic pain syndrome: Secondary | ICD-10-CM | POA: Diagnosis not present

## 2024-04-18 DIAGNOSIS — A419 Sepsis, unspecified organism: Secondary | ICD-10-CM | POA: Diagnosis not present

## 2024-04-18 DIAGNOSIS — Z89431 Acquired absence of right foot: Secondary | ICD-10-CM | POA: Diagnosis not present

## 2024-04-18 DIAGNOSIS — E1142 Type 2 diabetes mellitus with diabetic polyneuropathy: Secondary | ICD-10-CM | POA: Diagnosis not present

## 2024-04-18 DIAGNOSIS — Z743 Need for continuous supervision: Secondary | ICD-10-CM | POA: Diagnosis not present

## 2024-04-18 DIAGNOSIS — R279 Unspecified lack of coordination: Secondary | ICD-10-CM | POA: Diagnosis not present

## 2024-04-18 DIAGNOSIS — I739 Peripheral vascular disease, unspecified: Secondary | ICD-10-CM | POA: Diagnosis not present

## 2024-04-18 DIAGNOSIS — G629 Polyneuropathy, unspecified: Secondary | ICD-10-CM | POA: Diagnosis not present

## 2024-04-18 DIAGNOSIS — E1151 Type 2 diabetes mellitus with diabetic peripheral angiopathy without gangrene: Secondary | ICD-10-CM | POA: Diagnosis not present

## 2024-04-18 DIAGNOSIS — K219 Gastro-esophageal reflux disease without esophagitis: Secondary | ICD-10-CM | POA: Diagnosis not present

## 2024-04-18 DIAGNOSIS — D649 Anemia, unspecified: Secondary | ICD-10-CM | POA: Diagnosis not present

## 2024-04-18 DIAGNOSIS — E785 Hyperlipidemia, unspecified: Secondary | ICD-10-CM | POA: Diagnosis not present

## 2024-04-18 DIAGNOSIS — R110 Nausea: Secondary | ICD-10-CM | POA: Diagnosis not present

## 2024-04-18 DIAGNOSIS — Z9889 Other specified postprocedural states: Secondary | ICD-10-CM | POA: Diagnosis not present

## 2024-04-18 DIAGNOSIS — I5032 Chronic diastolic (congestive) heart failure: Secondary | ICD-10-CM | POA: Diagnosis not present

## 2024-04-18 DIAGNOSIS — R262 Difficulty in walking, not elsewhere classified: Secondary | ICD-10-CM | POA: Diagnosis not present

## 2024-04-18 DIAGNOSIS — R7989 Other specified abnormal findings of blood chemistry: Secondary | ICD-10-CM | POA: Diagnosis not present

## 2024-04-18 DIAGNOSIS — R2689 Other abnormalities of gait and mobility: Secondary | ICD-10-CM | POA: Diagnosis not present

## 2024-04-18 DIAGNOSIS — J449 Chronic obstructive pulmonary disease, unspecified: Secondary | ICD-10-CM | POA: Diagnosis not present

## 2024-04-18 DIAGNOSIS — Z89511 Acquired absence of right leg below knee: Secondary | ICD-10-CM | POA: Diagnosis not present

## 2024-04-18 DIAGNOSIS — Z7401 Bed confinement status: Secondary | ICD-10-CM | POA: Diagnosis not present

## 2024-04-18 DIAGNOSIS — J309 Allergic rhinitis, unspecified: Secondary | ICD-10-CM | POA: Diagnosis not present

## 2024-04-18 DIAGNOSIS — M869 Osteomyelitis, unspecified: Secondary | ICD-10-CM | POA: Diagnosis not present

## 2024-04-18 DIAGNOSIS — I10 Essential (primary) hypertension: Secondary | ICD-10-CM | POA: Diagnosis not present

## 2024-04-18 DIAGNOSIS — N3281 Overactive bladder: Secondary | ICD-10-CM | POA: Diagnosis not present

## 2024-04-18 DIAGNOSIS — M86171 Other acute osteomyelitis, right ankle and foot: Secondary | ICD-10-CM | POA: Diagnosis not present

## 2024-04-18 DIAGNOSIS — M6281 Muscle weakness (generalized): Secondary | ICD-10-CM | POA: Diagnosis not present

## 2024-04-18 DIAGNOSIS — E1143 Type 2 diabetes mellitus with diabetic autonomic (poly)neuropathy: Secondary | ICD-10-CM | POA: Diagnosis not present

## 2024-04-18 DIAGNOSIS — N182 Chronic kidney disease, stage 2 (mild): Secondary | ICD-10-CM | POA: Diagnosis not present

## 2024-04-18 LAB — GLUCOSE, CAPILLARY
Glucose-Capillary: 211 mg/dL — ABNORMAL HIGH (ref 70–99)
Glucose-Capillary: 131 mg/dL — ABNORMAL HIGH (ref 70–99)

## 2024-04-18 MED ORDER — HYDROMORPHONE HCL 2 MG PO TABS
2.0000 mg | ORAL_TABLET | Freq: Once | ORAL | Status: AC
  Administered 2024-04-18: 2 mg via ORAL
  Filled 2024-04-18: qty 1

## 2024-04-18 MED ORDER — METHOCARBAMOL 500 MG PO TABS: 500.0000 mg | ORAL_TABLET | Freq: Three times a day (TID) | ORAL | Status: DC | PRN

## 2024-04-18 MED ORDER — HYDROMORPHONE HCL 4 MG PO TABS: 4.0000 mg | ORAL_TABLET | ORAL | 0 refills | Status: AC | PRN

## 2024-04-18 NOTE — Progress Notes (Signed)
 Orthopedic Tech Progress Note Patient Details:  Jennifer [name] 01/15/1961 [id number]  Patient ID: [name], female   DOB: [birth year], 63 y.o.   MRN: [id number]  Put in order w/ [employer] R Retention sock  [name] 04/18/2024, 8:44 AM

## 2024-04-18 NOTE — Progress Notes (Signed)
                  Subjective:   Summary:  63 yo F with PMH of insulin  dependent T2DM, COPD, CKD 2, L BKA in 2014, R TMA in 2024, resection of R fifth metatarsal on 4/8 due to osteomyelitis, readmitted for sepsis 2/2 post-op wound infection and osteomyelitis, s/p R BKA on 4/21. Medically stable for discharge to SNF  Patient describes her pain as "sore" today. Bandaged has been replaced by vascular surgery team. Awaiting SNF placement.   Objective:  Vital signs in last 24 hours: Vitals:   04/17/24 1659 04/17/24 1928 04/18/24 0508 04/18/24 0727  BP: (!) 157/81 (!) 157/70 (!) 152/65 138/86  Pulse: 87 86 73 79  Resp: 16   16  Temp: 97.7 F (36.5 C) 98.4 F (36.9 C) 98.3 F (36.8 C) 98.2 F (36.8 C)  TempSrc: Oral Oral Oral Oral  SpO2: 95% 92% 96% 94%  Weight:      Height:       Supplemental O2: 2L [place] SpO2: 94 % O2 Flow Rate (L/min): 1 L/min  Physical Exam:  Constitutional: chronically ill appearing. No acute distress Ext: R BKA stump with clean stump sock  Assessment/Plan:   S/p R BKA d/t recurrent osteomyelitis Sepsis, resolved S/p R BKA on 4/21. Stable for discharge to [facility] when a bed becomes available.  - Pending discharge to SNF - Blood cultures NGTD. Off antibiotics - Oral dilaudid  4mg  q4h, Scheduled Tylenol  1000mg  q8h, pregabalin  200mg  TID   T2DM Blood sugar moderately well-controlled from mid 100s to 200s - Semglee  60 units daily, mealtime Novolog  7 units, moderate SSI with meals and at bedtime  Acute hypoxic respiratory failure, improved: CXR shows atelectasis. Encourage incentive spirometer HTN: home spironolactone  50mg  daily, amlodipine  10mg  HLD, PAD: continue home rosuvastatin  20mg  Chronic back pain: continue home pregabalin  200mg  TID COPD: continue home Anoro Ellipta  Depression: continue home duloxetine  60mg  GERD: continue home pantoprazole  40mg  CKD2: stable, monitor with morning labs Urinary incontinence: continue home  Myrbetriq  Gastroparesis: Reglan  10mg  qid  Diet: HH/CM VTE: Lovenox  Code: Full  Dispo: Anticipated discharge to Skilled nursing facility pending medical stability.   Jayson Michael, MD PGY-1 Internal Medicine Resident Pager Number [phone] Please contact the on call pager after 5 pm and on weekends at [phone].

## 2024-04-18 NOTE — Progress Notes (Signed)
 Report called and given to [facility] RN at [facility]. AVS and pain med script placed in discharge packet. IV removed. Patient taken by PTAR out of hospital. Patient sent with all belongings including clothes, purse, phone and left leg prosthesis.

## 2024-04-18 NOTE — Plan of Care (Signed)
  Problem: Clinical Measurements: Goal: Respiratory complications will improve Outcome: Adequate for Discharge   Problem: Activity: Goal: Risk for activity intolerance will decrease Outcome: Adequate for Discharge   Problem: Nutrition: Goal: Adequate nutrition will be maintained Outcome: Adequate for Discharge   Problem: Pain Managment: Goal: General experience of comfort will improve and/or be controlled Outcome: Adequate for Discharge

## 2024-04-18 NOTE — Progress Notes (Addendum)
  Progress Note    04/18/2024 8:10 AM 7 Days Post-Op  Subjective:  no complaints   Vitals:   04/18/24 0508 04/18/24 0727  BP: (!) 152/65 138/86  Pulse: 73 79  Resp:  16  Temp: 98.3 F (36.8 C) 98.2 F (36.8 C)  SpO2: 96% 94%   Physical Exam Lungs:  non labored Incisions:  R BKA incision c/d/i Neurologic: A&O  CBC    Component Value Date/Time   WBC 9.0 04/15/2024 0340   RBC 2.57 (L) 04/15/2024 0340   HGB 7.5 (L) 04/15/2024 0340   HGB 12.2 09/01/2022 1000   HCT 24.1 (L) 04/15/2024 0340   HCT 38.5 09/01/2022 1000   PLT 331 04/15/2024 0340   PLT 175 09/01/2022 1000   MCV 93.8 04/15/2024 0340   MCV 93 09/01/2022 1000   MCH 29.2 04/15/2024 0340   MCHC 31.1 04/15/2024 0340   RDW 14.1 04/15/2024 0340   RDW 14.4 09/01/2022 1000   LYMPHSABS 2.6 04/05/2024 2203   LYMPHSABS 2.7 09/01/2022 1000   MONOABS 0.9 04/05/2024 2203   EOSABS 0.1 04/05/2024 2203   EOSABS 0.1 09/01/2022 1000   BASOSABS 0.1 04/05/2024 2203   BASOSABS 0.1 09/01/2022 1000    BMET    Component Value Date/Time   NA 136 04/12/2024 0554   NA 141 02/18/2024 0932   K 4.0 04/12/2024 0554   CL 102 04/12/2024 0554   CO2 25 04/12/2024 0554   GLUCOSE 179 (H) 04/12/2024 0554   BUN 11 04/12/2024 0554   BUN 10 02/18/2024 0932   CREATININE 0.84 04/13/2024 0645   CREATININE 0.68 01/31/2015 1641   CALCIUM  7.9 (L) 04/12/2024 0554   GFRNONAA >60 04/13/2024 0645   GFRNONAA >89 01/31/2015 1641   GFRAA 54 (L) 10/25/2020 1131   GFRAA >89 01/31/2015 1641    INR    Component Value Date/Time   INR 1.3 (H) 04/05/2024 2203     Intake/Output Summary (Last 24 hours) at 04/18/2024 0810 Last data filed at 04/17/2024 1700 Gross per 24 hour  Intake 480 ml  Output 1200 ml  Net -720 ml     Assessment/Plan:  63 y.o. female is s/p R BKA 7 Days Post-Op   R BKA incision well appearing with viable skin edges Compression dressing applied; stump sock ordered SNF vs CIR when approved   Cordie Deters,  PA-C [facility] [phone] 04/18/2024 8:10 AM  VASCULAR STAFF ADDENDUM: I have independently interviewed and examined the patient. I agree with the above.  BKA site healing nicely. Needs stump sock on.  Kayla Part MD [facility] Phone Number: [phone] 04/18/2024 12:06 PM

## 2024-04-18 NOTE — TOC Transition Note (Signed)
 Transition of Care [facility]) - Discharge Note   Patient Details  Name: Jennifer [name] MRN: [id number] Date of Birth: [birth year]  Transition of Care [facility]) CM/SW Contact:  Juliane Och, LCSW Phone Number: 04/18/2024, 2:28 PM   Clinical Narrative:     [facility] will DC to: [facility] Anticipated DC date: 04/18/2024 Family notified: Lynnie Tischer; Father; [phone] Transport by: Lyna Sandhoff   Per MD patient ready for DC to [facility] SNF. RN to call report prior to discharge [phone]). RN, patient, patient's family, and facility notified of DC. Discharge Summary and FL2 sent to facility. DC packet on chart. Ambulance transport requested for patient at 14:30.   CSW will sign off for now as social work intervention is no longer needed. Please consult us  again if new needs arise.    Final next level of care: Skilled Nursing Facility Barriers to Discharge: Barriers Resolved   Patient Goals and CMS Choice Patient states their goals for this hospitalization and ongoing recovery are:: SNF CMS Medicare.gov Compare Post Acute Care list provided to::  (await post op PT/OT recommendations) Choice offered to / list presented to :  (await post op PT/OT recommendations)      Discharge Placement              Patient chooses bed at: Other - please specify in the comment section below: [facility]) Patient to be transferred to facility by: [facility] Name of family member notified: Delany Behl; Father; [phone] Patient and family notified of of transfer: 04/18/24  Discharge Plan and Services Additional resources added to the After Visit Summary for     Discharge Planning Services: CM Consult Post Acute Care Choice:  (await post op PT/OT recommendations)          DME Arranged:  (await post op PT/OT recommendations)         HH Arranged:  (await post op PT/OT recommendations)          Social Drivers of Health (SDOH) Interventions SDOH Screenings   Food  Insecurity: No Food Insecurity (04/06/2024)  Housing: Low Risk  (04/06/2024)  Transportation Needs: No Transportation Needs (04/06/2024)  Recent Concern: Transportation Needs - Unmet Transportation Needs (03/24/2024)  Utilities: Not At Risk (04/06/2024)  Recent Concern: Utilities - At Risk (03/24/2024)  Alcohol Screen: Low Risk  (03/31/2023)  Depression (PHQ2-9): High Risk (10/08/2023)  Financial Resource Strain: High Risk (03/31/2023)  Physical Activity: Inactive (03/31/2023)  Social Connections: Socially Isolated (04/06/2024)  Stress: Stress Concern Present (03/31/2023)  Tobacco Use: High Risk (04/11/2024)     Readmission Risk Interventions    04/07/2024   12:00 PM 03/29/2024    1:06 PM 10/27/2021   10:18 AM  Readmission Risk Prevention Plan  Transportation Screening Complete Complete Complete  PCP or Specialist Appt within 3-5 Days   Complete  HRI or Home Care Consult  Complete Complete  Social Work Consult for Recovery Care Planning/Counseling  Complete Complete  Palliative Care Screening  Not Applicable Not Applicable  Medication Review (RN Care Manager) Referral to Pharmacy Referral to Pharmacy Complete  PCP or Specialist appointment within 3-5 days of discharge Complete    HRI or Home Care Consult Complete    SW Recovery Care/Counseling Consult Complete

## 2024-04-18 NOTE — Discharge Summary (Signed)
 Name: Jennifer [name] MRN: [id number] DOB: 1961-06-10 63 y.o. PCP: Sherol Dixie, MD  Date of Admission: 04/05/2024  8:30 PM Date of Discharge: 04/18/24 Attending Physician: Dr. Lelia Putnam  Discharge Diagnosis: Principal Problem:   Osteomyelitis of right foot [facility]) Active Problems:   Vaginal candidiasis   Peripheral arterial disease with history of revascularization (HCC)   Opioid dependence, uncomplicated (HCC)   Class 2 severe obesity with serious comorbidity and body mass index (BMI) of 35.0 to 35.9 in adult [facility])   Urinary incontinence, mixed, urge/stress/functional   Diabetic polyneuropathy associated with type 2 diabetes mellitus (HCC)   Diabetic gastroparesis associated with type 2 diabetes mellitus (HCC)   Sepsis (HCC)    Discharge Medications: Allergies as of 04/18/2024       Reactions   Beef-derived Drug Products Diarrhea   Benazepril  Swelling   Possible tongue swelling 06/14/2021   Chantix  [varenicline ] Other (See Comments)   Unknown reaction   Ioversol    Morphine Sulfate    Abilify  [aripiprazole ] Other (See Comments)   Urinary freq Nov 2016   Iohexol Other (See Comments)   IV CONTRAST CAUSED TRANSIENT NEPHROPATHY (KIDNEY INSUFFICIENCY) IN 2007   Ivp Dye [iodinated Contrast Media] Other (See Comments)   IV CONTRAST CAUSED TRANSIENT NEPHROPATHY (KIDNEY INSUFFICIENCY) IN 2007        Medication List     PAUSE taking these medications    metoprolol  succinate 100 MG 24 hr tablet Wait to take this until your doctor or other care provider tells you to start again. Commonly known as: TOPROL -XL Take 1 tablet (100 mg total) by mouth daily. TAKE WITH OR IMMEDIATELY FOLLOWING A MEAL.       STOP taking these medications    oxyCODONE  5 MG immediate release tablet Commonly known as: Oxy IR/ROXICODONE        TAKE these medications    Accu-Chek Guide Test test strip Generic drug: glucose blood Use as back up to CGM sensors up to 1 time a day    Accu-Chek Softclix Lancets lancets Use as back up to CGM sensors up to 1 time a day   acetaminophen  650 MG CR tablet Commonly known as: TYLENOL  Take 2 tablets (1,300 mg total) by mouth every 8 (eight) hours as needed for pain. What changed: when to take this   albuterol  108 (90 Base) MCG/ACT inhaler Commonly known as: ProAir  HFA INHALE 2 PUFFS BY MOUTH EVERY 6 HOURS AS NEEDED FOR WHEEZING   amLODipine  10 MG tablet Commonly known as: NORVASC  Take 1 tablet (10 mg total) by mouth daily.   Anoro Ellipta  62.5-25 MCG/ACT Aepb Generic drug: umeclidinium-vilanterol Inhale 1 puff into the lungs in the morning.   Baclofen  5 MG Tabs Take 1 tablet (5 mg total) by mouth 2 (two) times daily as needed (muscle spasms).   Dexcom G7 Sensor Misc Place new sensor every 10 days. Use to monitor blood sugar continuously.   DULoxetine  60 MG capsule Commonly known as: CYMBALTA  Take 1 capsule (60 mg total) by mouth daily.   HYDROmorphone  4 MG tablet Commonly known as: DILAUDID  Take 1 tablet (4 mg total) by mouth every 4 (four) hours as needed for up to 5 days for severe pain (pain score 7-10).   lidocaine  5 % Commonly known as: Lidoderm  Place 1 patch onto the skin daily. Remove & Discard patch within 12 hours or as directed by MD   metFORMIN  500 MG 24 hr tablet Commonly known as: GLUCOPHAGE -XR Take 1 tablet (500 mg total) by  mouth daily with breakfast.   methocarbamol  500 MG tablet Commonly known as: ROBAXIN  Take 1 tablet (500 mg total) by mouth 3 (three) times daily as needed for muscle spasms.   metoCLOPramide  5 MG tablet Commonly known as: Reglan  Take 1 tablet (5 mg total) by mouth 3 (three) times daily before meals.   Myrbetriq  50 MG Tb24 tablet Generic drug: mirabegron  ER Take 1 tablet (50 mg total) by mouth daily.   ondansetron  4 MG disintegrating tablet Commonly known as: ZOFRAN -ODT Take 1 tablet (4 mg total) by mouth every 8 (eight) hours as needed for nausea or  vomiting. What changed: when to take this   Ozempic  (1 MG/DOSE) 4 MG/3ML Sopn Generic drug: Semaglutide  (1 MG/DOSE) Inject 1 mg into the skin once a week.   pantoprazole  40 MG tablet Commonly known as: PROTONIX  Take 1 tablet (40 mg total) by mouth daily.   pregabalin  200 MG capsule Commonly known as: LYRICA  Take 1 capsule (200 mg total) by mouth 3 (three) times daily.   rosuvastatin  20 MG tablet Commonly known as: CRESTOR  Take 1 tablet (20 mg total) by mouth in the morning.   spironolactone  50 MG tablet Commonly known as: ALDACTONE  Take 1 tablet (50 mg total) by mouth daily. What changed: when to take this   SSD 1 % cream Generic drug: silver  sulfADIAZINE  Apply 1 Application topically daily. To the wounds on your leg and foot   TechLite Plus Pen Needles 32G X 4 MM Misc Generic drug: Insulin  Pen Needle Use to inject insulin  4 (four) times daily.   traZODone  100 MG tablet Commonly known as: DESYREL  Take 200 mg by mouth at bedtime as needed for sleep.   Tresiba  FlexTouch 200 UNIT/ML FlexTouch Pen Generic drug: insulin  degludec Inject 65 Units into the skin daily.        Disposition and follow-up:   Jennifer [name] was discharged from [facility] in Stable condition.  At the hospital follow up visit please address:  1.  Follow-up:  S/p BKA: assess for wound healing and signs of recurrent infection   HTN: metoprolol  was held during admission and at discharge. Restart if indicated.   2.  Labs / imaging needed at time of follow-up: none  3.  Pending labs/ test needing follow-up: none  4.  Medication Changes:   Started: dilaudid  4mg  q4h prn  Stopped:  Changed: paused metoprolol  until restarted by PCP  Abx -   End Date:  Follow-up Appointments: Please call to schedule a PCP appt after SNF discharge 5/29 at 10:45 am with Vascular Surgery  Hospital Course by problem list:  Recurrent R foot osteomyelitis s/p BKA 4/21 R fifth metatarsal  osteomyelitis s/p resection 4/8 Sepsis, resolved  Patient presented with worsening pain, fever, and malaise after recent admission for R foot osteomyelitis requiring transmetatarsal amputation by podiatry. After discharge she developed a fever with worsening foot pain. She was not able to tolerate the full three days of Augmentin  prescribed at discharge. In the ED patient was septic with a fever and hypotension. MRI showed recurrent osteomyelitis primarily along the endosteum of the fourth metatarsal and distal cuboid, as well as a likely second wound along the distal margin of the amputation site. Broad spectrum antibiotics were initiated with cefepime , vancomycin , and metronidazole , as well as IV fluids for volume resuscitation. Patient had defervesced and was HDS by hospital day 1. Vascular surgery team was consulted, who performed a right below-the-knee amputation on 4/21. Antibiotics were discontinued 48 hours after  the surgery. The patient tolerated the procedure well, and was discharged to a skilled nursing facility on 4/28 for further therapy and rehabilitation.   T2DM Blood sugar was maintained within normal range with semglee  40 units daily, 5 units of mealtime novolog , moderate SSI with meals and at bedtime.   HTN: Normotensive on home amlodipine  10mg  HLD, PAD: continued home rosuvastatin  20mg  Chronic back pain: continued home pregabalin  200mg  TID COPD: continued home Anoro Ellipta  Depression: continued home duloxetine  60mg  GERD: continued home pantoprazole  40mg  CKD2: stable, at baseline Urinary incontinence: continued home Myrbetriq  Gastroparesis: Reglan  10mg  qid   Discharge Subjective: Patient describes her pain as "sore" today. Bandaged has been replaced by vascular surgery team. Awaiting SNF placement.   Discharge Exam:   BP 138/86 (BP Location: Left Arm)   Pulse 79   Temp 98.2 F (36.8 C) (Oral)   Resp 16   Ht 5\' 5"  (1.651 m)   Wt 99.8 kg   SpO2 94%   BMI 36.61 kg/m   Constitutional: chronically ill appearing. No acute distress Ext: R BKA stump with clean stump sock  Pertinent Labs, Studies, and Procedures:     Latest Ref Rng & Units 04/15/2024    3:40 AM 04/14/2024    7:08 AM 04/13/2024    6:45 AM  CBC  WBC 4.0 - 10.5 K/uL 9.0  9.5  11.0   Hemoglobin 12.0 - 15.0 g/dL 7.5  7.9  8.1   Hematocrit 36.0 - 46.0 % 24.1  25.4  25.6   Platelets 150 - 400 K/uL 331  302  297        Latest Ref Rng & Units 04/13/2024    6:45 AM 04/12/2024    5:54 AM 04/11/2024    6:49 AM  CMP  Glucose 70 - 99 mg/dL  981  191   BUN 8 - 23 mg/dL  11  11   Creatinine 4.78 - 1.00 mg/dL 2.95  6.21  3.08   Sodium 135 - 145 mmol/L  136  138   Potassium 3.5 - 5.1 mmol/L  4.0  4.7   Chloride 98 - 111 mmol/L  102  104   CO2 22 - 32 mmol/L  25  29   Calcium  8.9 - 10.3 mg/dL  7.9  8.6     MR FOOT RIGHT W WO CONTRAST Result Date: 04/06/2024 CLINICAL DATA:  Foot osteomyelitis EXAM: MRI OF THE RIGHT FOREFOOT WITHOUT AND WITH CONTRAST TECHNIQUE: Multiplanar, multisequence MR imaging of the right forefoot was performed before and after the administration of intravenous contrast. CONTRAST:  10mL GADAVIST  GADOBUTROL  1 MMOL/ML IV SOLN COMPARISON:  Radiographs 04/05/2024 FINDINGS: Bones/Joint/Cartilage Complete amputation of the fifth ray. Transmetatarsal amputation of the remaining digits. Large lateral soft tissue defect extends to the lateral margin of the articulation between the cuboid and the remaining fourth metatarsal as shown for example on image 11 of series 12, including abutment of of atmospheric gas from the wound with the periosteal margin of the base of the fourth metatarsal. Low-grade edema and enhancement primarily along the endosteum of the fourth metatarsal and distal cuboid suspicious for osteomyelitis given the proximity to the large wound. Nonspecific low-grade marrow enhancement distally in the lateral cuneiform and proximally in the third metatarsal base. Ligaments Lisfranc  ligament intact Muscles and Tendons Regional muscular edema likely a combination of neurogenic edema and atrophy related to distal amputation. Soft tissues As noted above there is a large lateral wound along the foot extending to the periosteal margin of the  base of the fourth metatarsal and the margin of the articulation of the fourth metatarsal with the cuboid. Substantial cutaneous and subcutaneous edema within along the margins of this wound tracking laterally along the remaining foot. There is also subcutaneous edema and possible wound along the distal margin of the amputation site medially with associated enhancement in the subcutaneous tissues anteromedially as on image 7 series 12, suspicious for a 2nd wound. Fatty tissues plantar to the 1st metatarsal likely related to original amputation flap, considered incidental. IMPRESSION: 1. Large lateral wound along the foot extending to the periosteal margin of the base of the fourth metatarsal and the margin of the articulation between the fourth metatarsal and the cuboid. Low-grade edema and enhancement primarily along the endosteum of the fourth metatarsal and distal cuboid suspicious for osteomyelitis given the proximity to the large wound. 2. Substantial cutaneous and subcutaneous edema along the margins of this wound tracking laterally along the remaining foot. 3. There is also subcutaneous edema and possible wound along the distal margin of the amputation site medially with associated enhancement in the subcutaneous tissues anteromedially, suspicious for a 2nd wound. 4. Regional muscular edema likely a combination of neurogenic edema and atrophy related to distal amputation. Electronically Signed   By: Freida Jes M.D.   On: 04/06/2024 09:26   DG Chest Port 1 View Result Date: 04/05/2024 CLINICAL DATA:  Possible sepsis EXAM: PORTABLE CHEST 1 VIEW COMPARISON:  03/15/2024 FINDINGS: The heart size and mediastinal contours are within normal limits.  Both lungs are clear. The visualized skeletal structures are unremarkable. IMPRESSION: No active disease. Electronically Signed   By: Violeta Grey M.D.   On: 04/05/2024 21:55   DG Foot 2 Views Right Result Date: 04/05/2024 CLINICAL DATA:  Right foot wound, initial encounter EXAM: RIGHT FOOT - 2 VIEW COMPARISON:  03/29/2024 FINDINGS: Wound VAC is again present laterally. Multiple antibiotic beads are again seen but decreased in number. Large soft tissue defect is noted new from the prior exam fifth metatarsal has been surgically removed. 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 No definitive erosive changes are seen. IMPRESSION: Enlarging lateral soft tissue wound with wound VAC in place. Electronically Signed   By: Violeta Grey M.D.   On: 04/05/2024 21:54    Signed: Jayson Michael, MD PGY-1 04/18/2024, 1:28 PM   Pager: [phone]

## 2024-04-19 ENCOUNTER — Other Ambulatory Visit: Payer: Self-pay

## 2024-04-19 LAB — GLUCOSE, CAPILLARY: Glucose-Capillary: 283 mg/dL — ABNORMAL HIGH (ref 70–99)

## 2024-04-21 ENCOUNTER — Encounter: Admitting: Internal Medicine

## 2024-04-22 ENCOUNTER — Telehealth: Payer: Self-pay | Admitting: *Deleted

## 2024-04-22 ENCOUNTER — Other Ambulatory Visit: Payer: Self-pay | Admitting: Internal Medicine

## 2024-04-22 DIAGNOSIS — Z89512 Acquired absence of left leg below knee: Secondary | ICD-10-CM

## 2024-04-22 DIAGNOSIS — Z89511 Acquired absence of right leg below knee: Secondary | ICD-10-CM

## 2024-04-22 NOTE — Telephone Encounter (Signed)
 I talked to pt who stated she's in [facility]). And stated [facility] told her she needs a rx to get prosthesis for her right leg and sleeve for the left leg.

## 2024-04-22 NOTE — Telephone Encounter (Signed)
 Copied from CRM [id number]. Topic: Appointments - Scheduling Inquiry for Clinic >> Apr 22, 2024  8:56 AM Shelby Dessert H wrote: Reason for CRM: Patient is calling because she is needing a sleeve for her left leg and she also needs a rx for her right leg, patients callback number is [phone]. >> Apr 22, 2024  8:58 AM Shelby Dessert H wrote: Patient wants to speak with Aden Agreste

## 2024-04-22 NOTE — Telephone Encounter (Signed)
 Communication  Reason for CRM: Patient is calling because she is needing a sleeve for her left leg and she also needs a rx for her right leg, patients callback number is [phone].   [name]   04/22/2024  8:58 AM  Patient wants to speak with Aden Agreste

## 2024-04-22 NOTE — Telephone Encounter (Deleted)
 Jennifer [name]

## 2024-04-22 NOTE — Telephone Encounter (Deleted)
 I talked to pt who stated she's currently in [facility]). And [facility] told her they need a rx for a prothesis for the right leg and sleeve for the left leg.

## 2024-04-22 NOTE — Telephone Encounter (Deleted)
 Copied from CRM [id number]. Topic: Appointments - Scheduling Inquiry for Clinic >> Apr 22, 2024  8:56 AM Shelby Dessert H wrote: Reason for CRM: Patient is calling because she is needing a sleeve for her left leg and she also needs a rx for her right leg, patients callback number is [phone]. >> Apr 22, 2024  8:58 AM Shelby Dessert H wrote: Patient wants to speak with Aden Agreste

## 2024-04-25 ENCOUNTER — Other Ambulatory Visit: Payer: Self-pay

## 2024-05-02 DIAGNOSIS — K219 Gastro-esophageal reflux disease without esophagitis: Secondary | ICD-10-CM | POA: Diagnosis not present

## 2024-05-02 DIAGNOSIS — I739 Peripheral vascular disease, unspecified: Secondary | ICD-10-CM | POA: Diagnosis not present

## 2024-05-02 DIAGNOSIS — M86171 Other acute osteomyelitis, right ankle and foot: Secondary | ICD-10-CM | POA: Diagnosis not present

## 2024-05-02 DIAGNOSIS — E1143 Type 2 diabetes mellitus with diabetic autonomic (poly)neuropathy: Secondary | ICD-10-CM | POA: Diagnosis not present

## 2024-05-02 DIAGNOSIS — E785 Hyperlipidemia, unspecified: Secondary | ICD-10-CM | POA: Diagnosis not present

## 2024-05-02 DIAGNOSIS — I10 Essential (primary) hypertension: Secondary | ICD-10-CM | POA: Diagnosis not present

## 2024-05-02 DIAGNOSIS — Z89431 Acquired absence of right foot: Secondary | ICD-10-CM | POA: Diagnosis not present

## 2024-05-02 DIAGNOSIS — N182 Chronic kidney disease, stage 2 (mild): Secondary | ICD-10-CM | POA: Diagnosis not present

## 2024-05-02 DIAGNOSIS — D649 Anemia, unspecified: Secondary | ICD-10-CM | POA: Diagnosis not present

## 2024-05-02 DIAGNOSIS — M6281 Muscle weakness (generalized): Secondary | ICD-10-CM | POA: Diagnosis not present

## 2024-05-02 DIAGNOSIS — I5032 Chronic diastolic (congestive) heart failure: Secondary | ICD-10-CM | POA: Diagnosis not present

## 2024-05-03 ENCOUNTER — Other Ambulatory Visit: Payer: Self-pay

## 2024-05-03 DIAGNOSIS — G894 Chronic pain syndrome: Secondary | ICD-10-CM | POA: Diagnosis not present

## 2024-05-03 DIAGNOSIS — E119 Type 2 diabetes mellitus without complications: Secondary | ICD-10-CM | POA: Diagnosis not present

## 2024-05-03 DIAGNOSIS — I10 Essential (primary) hypertension: Secondary | ICD-10-CM | POA: Diagnosis not present

## 2024-05-03 DIAGNOSIS — J449 Chronic obstructive pulmonary disease, unspecified: Secondary | ICD-10-CM | POA: Diagnosis not present

## 2024-05-04 ENCOUNTER — Other Ambulatory Visit: Payer: Self-pay

## 2024-05-04 DIAGNOSIS — I10 Essential (primary) hypertension: Secondary | ICD-10-CM | POA: Diagnosis not present

## 2024-05-04 DIAGNOSIS — R110 Nausea: Secondary | ICD-10-CM | POA: Diagnosis not present

## 2024-05-04 DIAGNOSIS — J449 Chronic obstructive pulmonary disease, unspecified: Secondary | ICD-10-CM | POA: Diagnosis not present

## 2024-05-04 DIAGNOSIS — G894 Chronic pain syndrome: Secondary | ICD-10-CM | POA: Diagnosis not present

## 2024-05-04 DIAGNOSIS — E119 Type 2 diabetes mellitus without complications: Secondary | ICD-10-CM | POA: Diagnosis not present

## 2024-05-04 DIAGNOSIS — G629 Polyneuropathy, unspecified: Secondary | ICD-10-CM | POA: Diagnosis not present

## 2024-05-04 DIAGNOSIS — N3281 Overactive bladder: Secondary | ICD-10-CM | POA: Diagnosis not present

## 2024-05-05 ENCOUNTER — Other Ambulatory Visit: Payer: Self-pay

## 2024-05-06 ENCOUNTER — Encounter (HOSPITAL_COMMUNITY)

## 2024-05-06 ENCOUNTER — Encounter

## 2024-05-09 ENCOUNTER — Ambulatory Visit: Payer: 59 | Admitting: Podiatry

## 2024-05-10 DIAGNOSIS — E1143 Type 2 diabetes mellitus with diabetic autonomic (poly)neuropathy: Secondary | ICD-10-CM | POA: Diagnosis not present

## 2024-05-10 DIAGNOSIS — M86171 Other acute osteomyelitis, right ankle and foot: Secondary | ICD-10-CM | POA: Diagnosis not present

## 2024-05-10 DIAGNOSIS — J309 Allergic rhinitis, unspecified: Secondary | ICD-10-CM | POA: Diagnosis not present

## 2024-05-10 DIAGNOSIS — N182 Chronic kidney disease, stage 2 (mild): Secondary | ICD-10-CM | POA: Diagnosis not present

## 2024-05-10 DIAGNOSIS — I10 Essential (primary) hypertension: Secondary | ICD-10-CM | POA: Diagnosis not present

## 2024-05-10 DIAGNOSIS — G894 Chronic pain syndrome: Secondary | ICD-10-CM | POA: Diagnosis not present

## 2024-05-10 DIAGNOSIS — E785 Hyperlipidemia, unspecified: Secondary | ICD-10-CM | POA: Diagnosis not present

## 2024-05-10 DIAGNOSIS — I5032 Chronic diastolic (congestive) heart failure: Secondary | ICD-10-CM | POA: Diagnosis not present

## 2024-05-10 DIAGNOSIS — K219 Gastro-esophageal reflux disease without esophagitis: Secondary | ICD-10-CM | POA: Diagnosis not present

## 2024-05-10 DIAGNOSIS — I739 Peripheral vascular disease, unspecified: Secondary | ICD-10-CM | POA: Diagnosis not present

## 2024-05-10 DIAGNOSIS — Z89431 Acquired absence of right foot: Secondary | ICD-10-CM | POA: Diagnosis not present

## 2024-05-10 DIAGNOSIS — M6281 Muscle weakness (generalized): Secondary | ICD-10-CM | POA: Diagnosis not present

## 2024-05-10 DIAGNOSIS — D649 Anemia, unspecified: Secondary | ICD-10-CM | POA: Diagnosis not present

## 2024-05-12 ENCOUNTER — Other Ambulatory Visit: Payer: Self-pay

## 2024-05-17 ENCOUNTER — Other Ambulatory Visit: Payer: Self-pay

## 2024-05-17 DIAGNOSIS — E119 Type 2 diabetes mellitus without complications: Secondary | ICD-10-CM | POA: Diagnosis not present

## 2024-05-17 DIAGNOSIS — G894 Chronic pain syndrome: Secondary | ICD-10-CM | POA: Diagnosis not present

## 2024-05-19 ENCOUNTER — Encounter: Payer: Self-pay | Admitting: Physician Assistant

## 2024-05-19 ENCOUNTER — Ambulatory Visit: Attending: Vascular Surgery | Admitting: Physician Assistant

## 2024-05-19 VITALS — BP 145/82 | HR 88 | Temp 98.4°F | Ht 65.0 in | Wt 220.0 lb

## 2024-05-19 DIAGNOSIS — S88111D Complete traumatic amputation at level between knee and ankle, right lower leg, subsequent encounter: Secondary | ICD-10-CM

## 2024-05-19 DIAGNOSIS — T8189XD Other complications of procedures, not elsewhere classified, subsequent encounter: Secondary | ICD-10-CM

## 2024-05-19 NOTE — Progress Notes (Signed)
 POST OPERATIVE OFFICE NOTE    CC:  F/u for surgery  HPI:  This is a 63 y.o. 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 Rosalva Comber.  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 She is currently residing at a [facility] [facility], [place]. She reports a lot of pain in her right BKA. She says it is both phantom pains and incisional pain as well. She explains that they have been cleaning her incision and wrapping her leg with an ACE. She has had minimal drainage. She is undergoing PT at her facility, which she says is going well.   Allergies  Allergen Reactions   Beef-Derived Drug Products Diarrhea   Benazepril  Swelling    Possible tongue swelling 06/14/2021   Chantix  [Varenicline ] Other (See Comments)    Unknown reaction   Ioversol    Morphine Sulfate    Abilify  [Aripiprazole ] Other (See Comments)    Urinary freq Nov 2016   Iohexol Other (See Comments)    IV CONTRAST CAUSED TRANSIENT NEPHROPATHY (KIDNEY INSUFFICIENCY) IN 2007    Ivp Dye [Iodinated Contrast Media] Other (See Comments)    IV CONTRAST CAUSED TRANSIENT NEPHROPATHY (KIDNEY INSUFFICIENCY) IN 2007    Current Outpatient Medications  Medication Sig Dispense Refill   Accu-Chek Softclix Lancets lancets Use as back up to CGM sensors up to 1 time a day 100 each 3   acetaminophen  (TYLENOL ) 650 MG CR tablet Take 2 tablets (1,300 mg total) by mouth every 8 (eight) hours as needed for pain. (Patient taking differently: Take 1,300 mg by mouth daily as needed for pain.) 30 tablet 0   albuterol  (PROAIR  HFA) 108 (90 Base) MCG/ACT inhaler INHALE 2 PUFFS BY MOUTH EVERY 6 HOURS AS NEEDED FOR WHEEZING 20.1 g 3   amLODipine  (NORVASC ) 10 MG tablet Take 1 tablet (10 mg total) by mouth daily. 90 tablet 3   Baclofen  5 MG TABS Take 1 tablet (5 mg total) by mouth 2 (two) times daily as needed (muscle spasms). 180 tablet 3   Continuous Glucose Sensor (DEXCOM G7 SENSOR) MISC Place new sensor every 10 days. Use to monitor blood sugar  continuously. 9 each 3   DULoxetine  (CYMBALTA ) 60 MG capsule Take 1 capsule (60 mg total) by mouth daily. 90 capsule 3   glucose blood (ACCU-CHEK GUIDE TEST) test strip Use as back up to CGM sensors up to 1 time a day 100 each 3   insulin  degludec (TRESIBA  FLEXTOUCH) 200 UNIT/ML FlexTouch Pen Inject 65 Units into the skin daily. 9 mL 3   Insulin  Pen Needle 32G X 4 MM MISC Use to inject insulin  4 (four) times daily. 100 each 10   lidocaine  (LIDODERM ) 5 % Place 1 patch onto the skin daily. Remove & Discard patch within 12 hours or as directed by MD 30 patch 0   metFORMIN  (GLUCOPHAGE -XR) 500 MG 24 hr tablet Take 1 tablet (500 mg total) by mouth daily with breakfast. 180 tablet 3   methocarbamol  (ROBAXIN ) 500 MG tablet Take 1 tablet (500 mg total) by mouth 3 (three) times daily as needed for muscle spasms.     metoCLOPramide  (REGLAN ) 5 MG tablet Take 1 tablet (5 mg total) by mouth 3 (three) times daily before meals. 90 tablet 1   [Paused] metoprolol  succinate (TOPROL -XL) 100 MG 24 hr tablet Take 1 tablet (100 mg total) by mouth daily. TAKE WITH OR IMMEDIATELY FOLLOWING A MEAL. 90 tablet 3   MYRBETRIQ  50 MG TB24  tablet Take 1 tablet (50 mg total) by mouth daily. 90 tablet 3   ondansetron  (ZOFRAN -ODT) 4 MG disintegrating tablet Take 1 tablet (4 mg total) by mouth every 8 (eight) hours as needed for nausea or vomiting. (Patient taking differently: Take 4 mg by mouth daily as needed for nausea or vomiting.) 20 tablet 0   pantoprazole  (PROTONIX ) 40 MG tablet Take 1 tablet (40 mg total) by mouth daily. 90 tablet 3   pregabalin  (LYRICA ) 200 MG capsule Take 1 capsule (200 mg total) by mouth 3 (three) times daily. 270 capsule 1   rosuvastatin  (CRESTOR ) 20 MG tablet Take 1 tablet (20 mg total) by mouth in the morning. 90 tablet 3   Semaglutide , 1 MG/DOSE, (OZEMPIC , 1 MG/DOSE,) 4 MG/3ML SOPN Inject 1 mg into the skin once a week. 3 mL 12   silver  sulfADIAZINE  (SILVADENE ) 1 % cream Apply 1 Application topically  daily. To the wounds on your leg and foot 50 g 0   spironolactone  (ALDACTONE ) 50 MG tablet Take 1 tablet (50 mg total) by mouth daily. (Patient taking differently: Take 50 mg by mouth 2 (two) times daily.) 90 tablet 3   traZODone  (DESYREL ) 100 MG tablet Take 200 mg by mouth at bedtime as needed for sleep.     umeclidinium-vilanterol (ANORO ELLIPTA ) 62.5-25 MCG/ACT AEPB Inhale 1 puff into the lungs in the morning. 120 each 11   No current facility-administered medications for this visit.     ROS:  See HPI  Physical Exam:  Vitals:   05/19/24 1016  BP: (!) 145/82  Pulse: 88  Temp: 98.4 F (36.9 C)  SpO2: 92%   Incision:  Right BKA stump healing well. Dry skin around staples. Staples removed Neuro: alert and oriented  Assessment/Plan:  This is a 63 y.o. female who is s/p: right BKA on 04/11/24 by Dr. Rosalva Comber.  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 Right BKA healing well. Staples removed. ACE wrap applied. Continue to clean incision with mild soap and water . She can follow up with [facility] for prosthetic evaluation. She can follow up with us  as needed.   Wynonia Hedges, [facility] [phone]   Clinic MD:  Rosalva Comber

## 2024-05-20 DIAGNOSIS — E785 Hyperlipidemia, unspecified: Secondary | ICD-10-CM | POA: Diagnosis not present

## 2024-05-20 DIAGNOSIS — D649 Anemia, unspecified: Secondary | ICD-10-CM | POA: Diagnosis not present

## 2024-05-23 ENCOUNTER — Other Ambulatory Visit: Payer: Self-pay

## 2024-05-23 ENCOUNTER — Other Ambulatory Visit: Payer: Self-pay | Admitting: Internal Medicine

## 2024-05-23 DIAGNOSIS — E119 Type 2 diabetes mellitus without complications: Secondary | ICD-10-CM

## 2024-05-24 ENCOUNTER — Other Ambulatory Visit (HOSPITAL_COMMUNITY): Payer: Self-pay

## 2024-05-24 ENCOUNTER — Other Ambulatory Visit: Payer: Self-pay

## 2024-05-24 DIAGNOSIS — Z89431 Acquired absence of right foot: Secondary | ICD-10-CM | POA: Diagnosis not present

## 2024-05-24 DIAGNOSIS — R7989 Other specified abnormal findings of blood chemistry: Secondary | ICD-10-CM | POA: Diagnosis not present

## 2024-05-24 DIAGNOSIS — N182 Chronic kidney disease, stage 2 (mild): Secondary | ICD-10-CM | POA: Diagnosis not present

## 2024-05-24 DIAGNOSIS — M86171 Other acute osteomyelitis, right ankle and foot: Secondary | ICD-10-CM | POA: Diagnosis not present

## 2024-05-24 DIAGNOSIS — D649 Anemia, unspecified: Secondary | ICD-10-CM | POA: Diagnosis not present

## 2024-05-24 DIAGNOSIS — E785 Hyperlipidemia, unspecified: Secondary | ICD-10-CM | POA: Diagnosis not present

## 2024-05-24 DIAGNOSIS — I739 Peripheral vascular disease, unspecified: Secondary | ICD-10-CM | POA: Diagnosis not present

## 2024-05-24 DIAGNOSIS — G894 Chronic pain syndrome: Secondary | ICD-10-CM | POA: Diagnosis not present

## 2024-05-24 DIAGNOSIS — I10 Essential (primary) hypertension: Secondary | ICD-10-CM | POA: Diagnosis not present

## 2024-05-24 DIAGNOSIS — M6281 Muscle weakness (generalized): Secondary | ICD-10-CM | POA: Diagnosis not present

## 2024-05-24 DIAGNOSIS — Z7189 Other specified counseling: Secondary | ICD-10-CM | POA: Diagnosis not present

## 2024-05-24 DIAGNOSIS — E1143 Type 2 diabetes mellitus with diabetic autonomic (poly)neuropathy: Secondary | ICD-10-CM | POA: Diagnosis not present

## 2024-05-24 DIAGNOSIS — I5032 Chronic diastolic (congestive) heart failure: Secondary | ICD-10-CM | POA: Diagnosis not present

## 2024-05-24 DIAGNOSIS — K219 Gastro-esophageal reflux disease without esophagitis: Secondary | ICD-10-CM | POA: Diagnosis not present

## 2024-05-24 MED ORDER — METOCLOPRAMIDE HCL 5 MG PO TABS
5.0000 mg | ORAL_TABLET | Freq: Three times a day (TID) | ORAL | 1 refills | Status: DC
  Filled 2024-06-16: qty 90, 30d supply, fill #0
  Filled 2024-07-19: qty 90, 30d supply, fill #1

## 2024-05-24 NOTE — Telephone Encounter (Signed)
 Medication sent to pharmacy

## 2024-05-26 ENCOUNTER — Telehealth: Payer: Self-pay | Admitting: *Deleted

## 2024-05-26 NOTE — Telephone Encounter (Signed)
 Will forward to PCP. Copied from CRM [id number]. Topic: Clinical - Medication Question >> May 26, 2024  7:46 AM Jennifer [name] wrote: Reason for CRM: Patient called and stated that the rehab place she is in stopped her pain medicine and she is wanting to talk to the provider, patient can be reached at [phone]. She is swelling up to the point she can't put on her leg. She is also in pain

## 2024-05-30 ENCOUNTER — Other Ambulatory Visit: Payer: Self-pay

## 2024-05-30 ENCOUNTER — Other Ambulatory Visit (HOSPITAL_COMMUNITY): Payer: Self-pay

## 2024-05-30 ENCOUNTER — Ambulatory Visit: Admitting: Podiatry

## 2024-05-30 DIAGNOSIS — E785 Hyperlipidemia, unspecified: Secondary | ICD-10-CM | POA: Diagnosis not present

## 2024-05-30 DIAGNOSIS — I739 Peripheral vascular disease, unspecified: Secondary | ICD-10-CM | POA: Diagnosis not present

## 2024-05-30 DIAGNOSIS — M86171 Other acute osteomyelitis, right ankle and foot: Secondary | ICD-10-CM | POA: Diagnosis not present

## 2024-05-30 DIAGNOSIS — I10 Essential (primary) hypertension: Secondary | ICD-10-CM | POA: Diagnosis not present

## 2024-05-30 DIAGNOSIS — I5032 Chronic diastolic (congestive) heart failure: Secondary | ICD-10-CM | POA: Diagnosis not present

## 2024-05-30 DIAGNOSIS — E119 Type 2 diabetes mellitus without complications: Secondary | ICD-10-CM

## 2024-05-30 DIAGNOSIS — E1143 Type 2 diabetes mellitus with diabetic autonomic (poly)neuropathy: Secondary | ICD-10-CM | POA: Diagnosis not present

## 2024-05-30 DIAGNOSIS — M6281 Muscle weakness (generalized): Secondary | ICD-10-CM | POA: Diagnosis not present

## 2024-05-30 DIAGNOSIS — N182 Chronic kidney disease, stage 2 (mild): Secondary | ICD-10-CM | POA: Diagnosis not present

## 2024-05-30 DIAGNOSIS — K219 Gastro-esophageal reflux disease without esophagitis: Secondary | ICD-10-CM | POA: Diagnosis not present

## 2024-05-30 DIAGNOSIS — D649 Anemia, unspecified: Secondary | ICD-10-CM | POA: Diagnosis not present

## 2024-05-30 DIAGNOSIS — Z89431 Acquired absence of right foot: Secondary | ICD-10-CM | POA: Diagnosis not present

## 2024-05-30 MED ORDER — TRESIBA FLEXTOUCH 200 UNIT/ML ~~LOC~~ SOPN
65.0000 [IU] | PEN_INJECTOR | Freq: Every day | SUBCUTANEOUS | 3 refills | Status: DC
  Filled 2024-05-30: qty 9, 27d supply, fill #0
  Filled 2024-06-25: qty 9, 27d supply, fill #1

## 2024-05-30 NOTE — Telephone Encounter (Signed)
 Medication sent to pharmacy

## 2024-05-31 ENCOUNTER — Telehealth: Payer: Self-pay | Admitting: Internal Medicine

## 2024-05-31 ENCOUNTER — Other Ambulatory Visit (HOSPITAL_COMMUNITY): Payer: Self-pay

## 2024-05-31 NOTE — Telephone Encounter (Signed)
 Call returned: she has had difficulty with postop and with chronic phantom pain-at 1 point her pain medicine was stopped but then has been resumed, though she states it is insufficiently effective.  Had a good post op visit with vascular surgeon to get staples out; still having a lot of swelling at residual limb. Will need a new shrinker (hers was stolen).  Therapy going ok but not doing well with standing - pain and balance challenges now that she is a bilateral amputee.  Hasn't seen a medical provider at [facility] since admission. Missed appt to see podiatrist Dr. Clydia Dart bc she was sick (next one is 7/14; this will probably be her last visit as she has no remaining foot).  Ms. Mickelsen needs an in person medical evaluation  at [facility] to review her medications, check diabetes and blood pressure, and ensure she is on an appropriate pain medicine regimen.  She does not know when she will be discharged from [facility].

## 2024-05-31 NOTE — Telephone Encounter (Signed)
 Call to patient.  Unable to laeve a message .  Did however sent an SMS message to patient.

## 2024-06-01 NOTE — Telephone Encounter (Signed)
Called pt no answer, vm is not set up.

## 2024-06-07 DIAGNOSIS — Z89431 Acquired absence of right foot: Secondary | ICD-10-CM | POA: Diagnosis not present

## 2024-06-07 DIAGNOSIS — M86171 Other acute osteomyelitis, right ankle and foot: Secondary | ICD-10-CM | POA: Diagnosis not present

## 2024-06-07 DIAGNOSIS — D649 Anemia, unspecified: Secondary | ICD-10-CM | POA: Diagnosis not present

## 2024-06-07 DIAGNOSIS — J309 Allergic rhinitis, unspecified: Secondary | ICD-10-CM | POA: Diagnosis not present

## 2024-06-07 DIAGNOSIS — M6281 Muscle weakness (generalized): Secondary | ICD-10-CM | POA: Diagnosis not present

## 2024-06-07 DIAGNOSIS — I739 Peripheral vascular disease, unspecified: Secondary | ICD-10-CM | POA: Diagnosis not present

## 2024-06-07 DIAGNOSIS — E785 Hyperlipidemia, unspecified: Secondary | ICD-10-CM | POA: Diagnosis not present

## 2024-06-07 DIAGNOSIS — I10 Essential (primary) hypertension: Secondary | ICD-10-CM | POA: Diagnosis not present

## 2024-06-07 DIAGNOSIS — K219 Gastro-esophageal reflux disease without esophagitis: Secondary | ICD-10-CM | POA: Diagnosis not present

## 2024-06-07 DIAGNOSIS — I5032 Chronic diastolic (congestive) heart failure: Secondary | ICD-10-CM | POA: Diagnosis not present

## 2024-06-07 DIAGNOSIS — E1143 Type 2 diabetes mellitus with diabetic autonomic (poly)neuropathy: Secondary | ICD-10-CM | POA: Diagnosis not present

## 2024-06-07 DIAGNOSIS — N182 Chronic kidney disease, stage 2 (mild): Secondary | ICD-10-CM | POA: Diagnosis not present

## 2024-06-07 DIAGNOSIS — J449 Chronic obstructive pulmonary disease, unspecified: Secondary | ICD-10-CM | POA: Diagnosis not present

## 2024-06-09 ENCOUNTER — Telehealth: Payer: Self-pay | Admitting: Student

## 2024-06-09 NOTE — Telephone Encounter (Signed)
 Received after hours page.  Attempted call back to patient back but no response.

## 2024-06-13 ENCOUNTER — Other Ambulatory Visit: Payer: Self-pay

## 2024-06-15 DIAGNOSIS — I10 Essential (primary) hypertension: Secondary | ICD-10-CM | POA: Diagnosis not present

## 2024-06-15 DIAGNOSIS — E785 Hyperlipidemia, unspecified: Secondary | ICD-10-CM | POA: Diagnosis not present

## 2024-06-15 DIAGNOSIS — Z89511 Acquired absence of right leg below knee: Secondary | ICD-10-CM | POA: Diagnosis not present

## 2024-06-15 DIAGNOSIS — G894 Chronic pain syndrome: Secondary | ICD-10-CM | POA: Diagnosis not present

## 2024-06-15 DIAGNOSIS — E119 Type 2 diabetes mellitus without complications: Secondary | ICD-10-CM | POA: Diagnosis not present

## 2024-06-15 DIAGNOSIS — J449 Chronic obstructive pulmonary disease, unspecified: Secondary | ICD-10-CM | POA: Diagnosis not present

## 2024-06-16 ENCOUNTER — Other Ambulatory Visit: Payer: Self-pay

## 2024-06-16 DIAGNOSIS — I739 Peripheral vascular disease, unspecified: Secondary | ICD-10-CM | POA: Diagnosis not present

## 2024-06-16 DIAGNOSIS — I10 Essential (primary) hypertension: Secondary | ICD-10-CM | POA: Diagnosis not present

## 2024-06-16 DIAGNOSIS — N182 Chronic kidney disease, stage 2 (mild): Secondary | ICD-10-CM | POA: Diagnosis not present

## 2024-06-16 DIAGNOSIS — K219 Gastro-esophageal reflux disease without esophagitis: Secondary | ICD-10-CM | POA: Diagnosis not present

## 2024-06-16 DIAGNOSIS — E1143 Type 2 diabetes mellitus with diabetic autonomic (poly)neuropathy: Secondary | ICD-10-CM | POA: Diagnosis not present

## 2024-06-16 DIAGNOSIS — M6281 Muscle weakness (generalized): Secondary | ICD-10-CM | POA: Diagnosis not present

## 2024-06-16 DIAGNOSIS — D649 Anemia, unspecified: Secondary | ICD-10-CM | POA: Diagnosis not present

## 2024-06-16 DIAGNOSIS — E785 Hyperlipidemia, unspecified: Secondary | ICD-10-CM | POA: Diagnosis not present

## 2024-06-16 DIAGNOSIS — M86171 Other acute osteomyelitis, right ankle and foot: Secondary | ICD-10-CM | POA: Diagnosis not present

## 2024-06-16 DIAGNOSIS — I5032 Chronic diastolic (congestive) heart failure: Secondary | ICD-10-CM | POA: Diagnosis not present

## 2024-06-16 DIAGNOSIS — Z89431 Acquired absence of right foot: Secondary | ICD-10-CM | POA: Diagnosis not present

## 2024-06-17 DIAGNOSIS — M86171 Other acute osteomyelitis, right ankle and foot: Secondary | ICD-10-CM | POA: Diagnosis not present

## 2024-06-17 DIAGNOSIS — E1143 Type 2 diabetes mellitus with diabetic autonomic (poly)neuropathy: Secondary | ICD-10-CM | POA: Diagnosis not present

## 2024-06-17 DIAGNOSIS — R279 Unspecified lack of coordination: Secondary | ICD-10-CM | POA: Diagnosis not present

## 2024-06-17 DIAGNOSIS — Z89431 Acquired absence of right foot: Secondary | ICD-10-CM | POA: Diagnosis not present

## 2024-06-17 DIAGNOSIS — R2689 Other abnormalities of gait and mobility: Secondary | ICD-10-CM | POA: Diagnosis not present

## 2024-06-21 ENCOUNTER — Other Ambulatory Visit: Payer: Self-pay

## 2024-06-21 ENCOUNTER — Other Ambulatory Visit (HOSPITAL_COMMUNITY): Payer: Self-pay

## 2024-06-22 ENCOUNTER — Other Ambulatory Visit: Payer: Self-pay | Admitting: Internal Medicine

## 2024-06-22 ENCOUNTER — Other Ambulatory Visit (HOSPITAL_COMMUNITY): Payer: Self-pay

## 2024-06-22 DIAGNOSIS — E118 Type 2 diabetes mellitus with unspecified complications: Secondary | ICD-10-CM

## 2024-06-22 MED ORDER — ONDANSETRON HCL 4 MG PO TABS
4.0000 mg | ORAL_TABLET | Freq: Three times a day (TID) | ORAL | 0 refills | Status: AC | PRN
  Filled 2024-06-22: qty 30, 10d supply, fill #0

## 2024-06-22 MED ORDER — FLUTICASONE PROPIONATE 50 MCG/ACT NA SUSP
1.0000 | Freq: Two times a day (BID) | NASAL | 0 refills | Status: AC
  Filled 2024-06-22: qty 16, 30d supply, fill #0

## 2024-06-22 MED ORDER — PANTOPRAZOLE SODIUM 40 MG PO TBEC
40.0000 mg | DELAYED_RELEASE_TABLET | Freq: Every day | ORAL | 0 refills | Status: DC
  Filled 2024-06-22 – 2024-07-19 (×2): qty 30, 30d supply, fill #0

## 2024-06-22 MED ORDER — INSULIN GLARGINE SOLOSTAR 100 UNIT/ML ~~LOC~~ SOPN
65.0000 [IU] | PEN_INJECTOR | Freq: Every day | SUBCUTANEOUS | 0 refills | Status: DC
Start: 2024-06-16 — End: 2024-07-20
  Filled 2024-06-22: qty 15, 23d supply, fill #0

## 2024-06-22 MED ORDER — HYDROMORPHONE HCL 4 MG PO TABS
4.0000 mg | ORAL_TABLET | ORAL | 0 refills | Status: DC | PRN
  Filled 2024-06-22: qty 20, 4d supply, fill #0

## 2024-06-22 MED ORDER — OZEMPIC (1 MG/DOSE) 4 MG/3ML ~~LOC~~ SOPN
1.0000 mg | PEN_INJECTOR | SUBCUTANEOUS | 12 refills | Status: AC
  Filled 2024-06-22 – 2024-07-19 (×2): qty 3, 28d supply, fill #0
  Filled 2024-08-18: qty 3, 28d supply, fill #1
  Filled 2024-09-08 – 2024-09-30 (×3): qty 3, 28d supply, fill #2
  Filled 2024-10-06: qty 3, 28d supply, fill #0
  Filled 2024-10-27: qty 3, 28d supply, fill #1
  Filled 2024-11-24: qty 3, 28d supply, fill #2
  Filled 2024-12-22: qty 3, 28d supply, fill #3
  Filled 2025-01-16 – 2025-01-21 (×2): qty 3, 28d supply, fill #4

## 2024-06-22 MED ORDER — PREGABALIN 225 MG PO CAPS
225.0000 mg | ORAL_CAPSULE | Freq: Three times a day (TID) | ORAL | 0 refills | Status: DC
  Filled 2024-06-22: qty 14, 7d supply, fill #0

## 2024-06-22 MED ORDER — METHOCARBAMOL 500 MG PO TABS
500.0000 mg | ORAL_TABLET | Freq: Three times a day (TID) | ORAL | 0 refills | Status: DC | PRN
  Filled 2024-06-22: qty 30, 10d supply, fill #0

## 2024-06-22 MED ORDER — OXYBUTYNIN CHLORIDE ER 10 MG PO TB24
10.0000 mg | ORAL_TABLET | Freq: Every day | ORAL | 0 refills | Status: DC
  Filled 2024-06-22: qty 30, 30d supply, fill #0

## 2024-06-25 ENCOUNTER — Other Ambulatory Visit (HOSPITAL_COMMUNITY): Payer: Self-pay

## 2024-06-30 ENCOUNTER — Ambulatory Visit: Payer: Self-pay

## 2024-06-30 NOTE — Telephone Encounter (Signed)
    FYI Only or Action Required?: Action required by provider: medication refill request.  Patient was last seen in primary care on 03/15/2024 by Trudy Mliss Dragon, MD.  Called Nurse Triage reporting No chief complaint on file..  Symptoms began today.  Interventions attempted: Prescription medications: See note .  Symptoms are: stable.  Triage Disposition: No disposition on file. See note   Patient/caregiver understands and will follow disposition?: Yes            Copied from CRM [id number]. Topic: Clinical - Red Word Triage >> Jun 30, 2024  9:47 AM Alfonso ORN wrote: Red Word that prompted transfer to Nurse Triage: recently discharged from nursing home , both legs amputated in extreme pain  Patient requesting her pain medication oxycodone  5 mg  call back # [phone] Answer Assessment - Initial Assessment Questions 1. REASON FOR CALL: What is the main reason for your call? or How can I best help you?    Patient denies pain during the call.  Confirms she has three Oxycodone  5mg  pills left.  Patient was simply calling to request a refill of her medication and schedule an office visit with MD Trudy Brow to pend medication as it is not on her med list. Pt scheduled for an appointment appropriately.    Patient preference for pharmacy is : [facility] [address]  Protocols used: Information Only Call - No Triage-A-AH

## 2024-07-04 ENCOUNTER — Ambulatory Visit: Admitting: Podiatry

## 2024-07-04 ENCOUNTER — Other Ambulatory Visit (HOSPITAL_COMMUNITY): Payer: Self-pay

## 2024-07-04 ENCOUNTER — Other Ambulatory Visit: Payer: Self-pay | Admitting: Internal Medicine

## 2024-07-04 ENCOUNTER — Ambulatory Visit: Payer: Self-pay

## 2024-07-04 DIAGNOSIS — G8929 Other chronic pain: Secondary | ICD-10-CM

## 2024-07-04 MED ORDER — OXYCODONE HCL 5 MG PO TABS
5.0000 mg | ORAL_TABLET | ORAL | 0 refills | Status: DC | PRN
  Filled 2024-07-04: qty 120, 20d supply, fill #0

## 2024-07-04 NOTE — Telephone Encounter (Unsigned)
 Copied from CRM [id number]. Topic: Clinical - Red Word Triage >> Jul 04, 2024  1:18 PM Alfonso ORN wrote: Red Word that prompted transfer to Nurse Triage: had both legs amputated have severe pain (patient requesting a refill for the  oxycodone  5 mg   Patient [phone]

## 2024-07-04 NOTE — Telephone Encounter (Signed)
 FYI Only or Action Required?: Action required by provider: patient requesting pain medication refill.  Patient was last seen in primary care on 03/15/2024 by Trudy Mliss Dragon, MD.  Called Nurse Triage reporting Pain.  Symptoms began several days ago.  Interventions attempted: Rest, hydration, or home remedies.  Symptoms are: unchanged.  Triage Disposition: See HCP Within 4 Hours (Or PCP Triage)-pain medication request  Patient/caregiver understands and will follow disposition?: No, wishes to speak with PCP  Reason for Disposition  [1] SEVERE pain (e.g., excruciating, unable to do any normal activities) AND [2] not improved after 2 hours of pain medicine  Answer Assessment - Initial Assessment Questions 1. ONSET: When did the pain start?      Patient reports pain to her stumps-chronic pain 2. LOCATION: Where is the pain located?      Bilateral stumps 3. PAIN: How bad is the pain?    (Scale 1-10; or mild, moderate, severe)     10 4. WORK OR EXERCISE: Has there been any recent work or exercise that involved this part of the body?      no 5. CAUSE: What do you think is causing the leg pain?     Pain from stumps 6. OTHER SYMPTOMS: Do you have any other symptoms? (e.g., chest pain, back pain, breathing difficulty, swelling, rash, fever, numbness, weakness)     Pain to hands.  Protocols used: Leg Pain-A-AH

## 2024-07-05 ENCOUNTER — Other Ambulatory Visit (HOSPITAL_COMMUNITY): Payer: Self-pay

## 2024-07-05 ENCOUNTER — Other Ambulatory Visit: Payer: Self-pay

## 2024-07-07 ENCOUNTER — Other Ambulatory Visit: Payer: Self-pay

## 2024-07-11 ENCOUNTER — Other Ambulatory Visit: Payer: Self-pay

## 2024-07-14 ENCOUNTER — Emergency Department (HOSPITAL_COMMUNITY)

## 2024-07-14 ENCOUNTER — Inpatient Hospital Stay (HOSPITAL_COMMUNITY)
Admission: EM | Admit: 2024-07-14 | Discharge: 2024-07-20 | DRG: 064 | Disposition: A | Discharge status: Home Health | Attending: Internal Medicine | Admitting: Internal Medicine

## 2024-07-14 ENCOUNTER — Other Ambulatory Visit (HOSPITAL_COMMUNITY): Payer: Self-pay

## 2024-07-14 ENCOUNTER — Other Ambulatory Visit: Payer: Self-pay

## 2024-07-14 DIAGNOSIS — R410 Disorientation, unspecified: Secondary | ICD-10-CM | POA: Diagnosis not present

## 2024-07-14 DIAGNOSIS — Z794 Long term (current) use of insulin: Secondary | ICD-10-CM

## 2024-07-14 DIAGNOSIS — E1169 Type 2 diabetes mellitus with other specified complication: Secondary | ICD-10-CM | POA: Diagnosis present

## 2024-07-14 DIAGNOSIS — N390 Urinary tract infection, site not specified: Secondary | ICD-10-CM | POA: Diagnosis present

## 2024-07-14 DIAGNOSIS — K0889 Other specified disorders of teeth and supporting structures: Secondary | ICD-10-CM | POA: Diagnosis present

## 2024-07-14 DIAGNOSIS — Q2112 Patent foramen ovale: Secondary | ICD-10-CM

## 2024-07-14 DIAGNOSIS — Z9842 Cataract extraction status, left eye: Secondary | ICD-10-CM

## 2024-07-14 DIAGNOSIS — F324 Major depressive disorder, single episode, in partial remission: Secondary | ICD-10-CM | POA: Diagnosis present

## 2024-07-14 DIAGNOSIS — Z8673 Personal history of transient ischemic attack (TIA), and cerebral infarction without residual deficits: Secondary | ICD-10-CM

## 2024-07-14 DIAGNOSIS — I253 Aneurysm of heart: Secondary | ICD-10-CM | POA: Diagnosis not present

## 2024-07-14 DIAGNOSIS — I130 Hypertensive heart and chronic kidney disease with heart failure and stage 1 through stage 4 chronic kidney disease, or unspecified chronic kidney disease: Secondary | ICD-10-CM | POA: Diagnosis not present

## 2024-07-14 DIAGNOSIS — E119 Type 2 diabetes mellitus without complications: Secondary | ICD-10-CM

## 2024-07-14 DIAGNOSIS — E1122 Type 2 diabetes mellitus with diabetic chronic kidney disease: Secondary | ICD-10-CM | POA: Diagnosis present

## 2024-07-14 DIAGNOSIS — R4701 Aphasia: Secondary | ICD-10-CM | POA: Diagnosis present

## 2024-07-14 DIAGNOSIS — R1310 Dysphagia, unspecified: Secondary | ICD-10-CM | POA: Diagnosis not present

## 2024-07-14 DIAGNOSIS — E1142 Type 2 diabetes mellitus with diabetic polyneuropathy: Secondary | ICD-10-CM | POA: Diagnosis present

## 2024-07-14 DIAGNOSIS — F17210 Nicotine dependence, cigarettes, uncomplicated: Secondary | ICD-10-CM | POA: Diagnosis present

## 2024-07-14 DIAGNOSIS — Z800 Family history of malignant neoplasm of digestive organs: Secondary | ICD-10-CM

## 2024-07-14 DIAGNOSIS — Z91041 Radiographic dye allergy status: Secondary | ICD-10-CM

## 2024-07-14 DIAGNOSIS — R7881 Bacteremia: Secondary | ICD-10-CM | POA: Diagnosis not present

## 2024-07-14 DIAGNOSIS — I69391 Dysphagia following cerebral infarction: Secondary | ICD-10-CM | POA: Diagnosis not present

## 2024-07-14 DIAGNOSIS — Z79899 Other long term (current) drug therapy: Secondary | ICD-10-CM

## 2024-07-14 DIAGNOSIS — E1139 Type 2 diabetes mellitus with other diabetic ophthalmic complication: Secondary | ICD-10-CM | POA: Diagnosis present

## 2024-07-14 DIAGNOSIS — E66812 Obesity, class 2: Secondary | ICD-10-CM | POA: Diagnosis present

## 2024-07-14 DIAGNOSIS — I5032 Chronic diastolic (congestive) heart failure: Secondary | ICD-10-CM | POA: Diagnosis not present

## 2024-07-14 DIAGNOSIS — Z95820 Peripheral vascular angioplasty status with implants and grafts: Secondary | ICD-10-CM | POA: Diagnosis not present

## 2024-07-14 DIAGNOSIS — Z8616 Personal history of COVID-19: Secondary | ICD-10-CM

## 2024-07-14 DIAGNOSIS — G9340 Encephalopathy, unspecified: Secondary | ICD-10-CM | POA: Diagnosis not present

## 2024-07-14 DIAGNOSIS — K5730 Diverticulosis of large intestine without perforation or abscess without bleeding: Secondary | ICD-10-CM | POA: Diagnosis not present

## 2024-07-14 DIAGNOSIS — K760 Fatty (change of) liver, not elsewhere classified: Secondary | ICD-10-CM | POA: Diagnosis not present

## 2024-07-14 DIAGNOSIS — Z89512 Acquired absence of left leg below knee: Secondary | ICD-10-CM

## 2024-07-14 DIAGNOSIS — I358 Other nonrheumatic aortic valve disorders: Secondary | ICD-10-CM | POA: Diagnosis present

## 2024-07-14 DIAGNOSIS — E785 Hyperlipidemia, unspecified: Secondary | ICD-10-CM | POA: Diagnosis present

## 2024-07-14 DIAGNOSIS — I63412 Cerebral infarction due to embolism of left middle cerebral artery: Secondary | ICD-10-CM | POA: Diagnosis not present

## 2024-07-14 DIAGNOSIS — J4489 Other specified chronic obstructive pulmonary disease: Secondary | ICD-10-CM | POA: Diagnosis present

## 2024-07-14 DIAGNOSIS — Z7951 Long term (current) use of inhaled steroids: Secondary | ICD-10-CM

## 2024-07-14 DIAGNOSIS — M0800 Unspecified juvenile rheumatoid arthritis of unspecified site: Secondary | ICD-10-CM | POA: Diagnosis not present

## 2024-07-14 DIAGNOSIS — R29702 NIHSS score 2: Secondary | ICD-10-CM | POA: Diagnosis not present

## 2024-07-14 DIAGNOSIS — Z9851 Tubal ligation status: Secondary | ICD-10-CM

## 2024-07-14 DIAGNOSIS — I371 Nonrheumatic pulmonary valve insufficiency: Secondary | ICD-10-CM | POA: Diagnosis present

## 2024-07-14 DIAGNOSIS — R29701 NIHSS score 1: Secondary | ICD-10-CM | POA: Diagnosis not present

## 2024-07-14 DIAGNOSIS — E876 Hypokalemia: Secondary | ICD-10-CM | POA: Insufficient documentation

## 2024-07-14 DIAGNOSIS — I672 Cerebral atherosclerosis: Secondary | ICD-10-CM | POA: Diagnosis not present

## 2024-07-14 DIAGNOSIS — R32 Unspecified urinary incontinence: Secondary | ICD-10-CM | POA: Insufficient documentation

## 2024-07-14 DIAGNOSIS — H5010 Unspecified exotropia: Secondary | ICD-10-CM | POA: Diagnosis present

## 2024-07-14 DIAGNOSIS — I739 Peripheral vascular disease, unspecified: Secondary | ICD-10-CM | POA: Diagnosis present

## 2024-07-14 DIAGNOSIS — N289 Disorder of kidney and ureter, unspecified: Secondary | ICD-10-CM

## 2024-07-14 DIAGNOSIS — Z9089 Acquired absence of other organs: Secondary | ICD-10-CM

## 2024-07-14 DIAGNOSIS — Z86718 Personal history of other venous thrombosis and embolism: Secondary | ICD-10-CM

## 2024-07-14 DIAGNOSIS — F1210 Cannabis abuse, uncomplicated: Secondary | ICD-10-CM | POA: Diagnosis not present

## 2024-07-14 DIAGNOSIS — G894 Chronic pain syndrome: Secondary | ICD-10-CM | POA: Diagnosis present

## 2024-07-14 DIAGNOSIS — G9341 Metabolic encephalopathy: Secondary | ICD-10-CM | POA: Diagnosis not present

## 2024-07-14 DIAGNOSIS — Z7984 Long term (current) use of oral hypoglycemic drugs: Secondary | ICD-10-CM

## 2024-07-14 DIAGNOSIS — I6340 Cerebral infarction due to embolism of unspecified cerebral artery: Secondary | ICD-10-CM | POA: Diagnosis not present

## 2024-07-14 DIAGNOSIS — H409 Unspecified glaucoma: Secondary | ICD-10-CM | POA: Diagnosis present

## 2024-07-14 DIAGNOSIS — Z90710 Acquired absence of both cervix and uterus: Secondary | ICD-10-CM

## 2024-07-14 DIAGNOSIS — I6523 Occlusion and stenosis of bilateral carotid arteries: Secondary | ICD-10-CM | POA: Diagnosis present

## 2024-07-14 DIAGNOSIS — E1165 Type 2 diabetes mellitus with hyperglycemia: Secondary | ICD-10-CM | POA: Diagnosis present

## 2024-07-14 DIAGNOSIS — H42 Glaucoma in diseases classified elsewhere: Secondary | ICD-10-CM | POA: Diagnosis present

## 2024-07-14 DIAGNOSIS — Z9841 Cataract extraction status, right eye: Secondary | ICD-10-CM

## 2024-07-14 DIAGNOSIS — I6389 Other cerebral infarction: Secondary | ICD-10-CM | POA: Diagnosis not present

## 2024-07-14 DIAGNOSIS — J449 Chronic obstructive pulmonary disease, unspecified: Secondary | ICD-10-CM | POA: Insufficient documentation

## 2024-07-14 DIAGNOSIS — I10 Essential (primary) hypertension: Secondary | ICD-10-CM | POA: Diagnosis present

## 2024-07-14 DIAGNOSIS — R471 Dysarthria and anarthria: Secondary | ICD-10-CM | POA: Diagnosis present

## 2024-07-14 DIAGNOSIS — M4800 Spinal stenosis, site unspecified: Secondary | ICD-10-CM | POA: Diagnosis present

## 2024-07-14 DIAGNOSIS — G43809 Other migraine, not intractable, without status migrainosus: Secondary | ICD-10-CM | POA: Diagnosis present

## 2024-07-14 DIAGNOSIS — E1151 Type 2 diabetes mellitus with diabetic peripheral angiopathy without gangrene: Secondary | ICD-10-CM | POA: Diagnosis present

## 2024-07-14 DIAGNOSIS — Z7982 Long term (current) use of aspirin: Secondary | ICD-10-CM

## 2024-07-14 DIAGNOSIS — N182 Chronic kidney disease, stage 2 (mild): Secondary | ICD-10-CM | POA: Diagnosis not present

## 2024-07-14 DIAGNOSIS — Z8249 Family history of ischemic heart disease and other diseases of the circulatory system: Secondary | ICD-10-CM

## 2024-07-14 DIAGNOSIS — Z8379 Family history of other diseases of the digestive system: Secondary | ICD-10-CM

## 2024-07-14 DIAGNOSIS — Z6841 Body Mass Index (BMI) 40.0 and over, adult: Secondary | ICD-10-CM

## 2024-07-14 DIAGNOSIS — Z885 Allergy status to narcotic agent status: Secondary | ICD-10-CM

## 2024-07-14 DIAGNOSIS — Z825 Family history of asthma and other chronic lower respiratory diseases: Secondary | ICD-10-CM

## 2024-07-14 DIAGNOSIS — Z888 Allergy status to other drugs, medicaments and biological substances status: Secondary | ICD-10-CM

## 2024-07-14 DIAGNOSIS — G546 Phantom limb syndrome with pain: Secondary | ICD-10-CM | POA: Diagnosis present

## 2024-07-14 DIAGNOSIS — K3184 Gastroparesis: Secondary | ICD-10-CM | POA: Diagnosis present

## 2024-07-14 DIAGNOSIS — I700 Atherosclerosis of aorta: Secondary | ICD-10-CM | POA: Diagnosis present

## 2024-07-14 DIAGNOSIS — Z860102 Personal history of hyperplastic colon polyps: Secondary | ICD-10-CM

## 2024-07-14 DIAGNOSIS — Z7985 Long-term (current) use of injectable non-insulin antidiabetic drugs: Secondary | ICD-10-CM

## 2024-07-14 DIAGNOSIS — K219 Gastro-esophageal reflux disease without esophagitis: Secondary | ICD-10-CM | POA: Diagnosis present

## 2024-07-14 DIAGNOSIS — Z89511 Acquired absence of right leg below knee: Secondary | ICD-10-CM

## 2024-07-14 DIAGNOSIS — Z7902 Long term (current) use of antithrombotics/antiplatelets: Secondary | ICD-10-CM

## 2024-07-14 DIAGNOSIS — R29700 NIHSS score 0: Secondary | ICD-10-CM | POA: Diagnosis not present

## 2024-07-14 DIAGNOSIS — R109 Unspecified abdominal pain: Secondary | ICD-10-CM | POA: Diagnosis not present

## 2024-07-14 DIAGNOSIS — E11319 Type 2 diabetes mellitus with unspecified diabetic retinopathy without macular edema: Secondary | ICD-10-CM | POA: Diagnosis present

## 2024-07-14 DIAGNOSIS — E1143 Type 2 diabetes mellitus with diabetic autonomic (poly)neuropathy: Secondary | ICD-10-CM | POA: Diagnosis present

## 2024-07-14 DIAGNOSIS — B961 Klebsiella pneumoniae [K. pneumoniae] as the cause of diseases classified elsewhere: Secondary | ICD-10-CM | POA: Diagnosis present

## 2024-07-14 DIAGNOSIS — I639 Cerebral infarction, unspecified: Secondary | ICD-10-CM | POA: Diagnosis not present

## 2024-07-14 DIAGNOSIS — Z833 Family history of diabetes mellitus: Secondary | ICD-10-CM

## 2024-07-14 DIAGNOSIS — Z8701 Personal history of pneumonia (recurrent): Secondary | ICD-10-CM

## 2024-07-14 DIAGNOSIS — Z9889 Other specified postprocedural states: Secondary | ICD-10-CM

## 2024-07-14 DIAGNOSIS — R739 Hyperglycemia, unspecified: Secondary | ICD-10-CM | POA: Diagnosis not present

## 2024-07-14 DIAGNOSIS — Z87440 Personal history of urinary (tract) infections: Secondary | ICD-10-CM

## 2024-07-14 DIAGNOSIS — N281 Cyst of kidney, acquired: Secondary | ICD-10-CM | POA: Diagnosis present

## 2024-07-14 DIAGNOSIS — G8929 Other chronic pain: Secondary | ICD-10-CM | POA: Diagnosis present

## 2024-07-14 DIAGNOSIS — F12129 Cannabis abuse with intoxication, unspecified: Secondary | ICD-10-CM | POA: Diagnosis present

## 2024-07-14 DIAGNOSIS — M5416 Radiculopathy, lumbar region: Secondary | ICD-10-CM | POA: Diagnosis not present

## 2024-07-14 DIAGNOSIS — Z8619 Personal history of other infectious and parasitic diseases: Secondary | ICD-10-CM

## 2024-07-14 DIAGNOSIS — M797 Fibromyalgia: Secondary | ICD-10-CM | POA: Diagnosis present

## 2024-07-14 HISTORY — DX: Type 2 diabetes mellitus with other diabetic neurological complication: Z79.4

## 2024-07-14 LAB — URINALYSIS, W/ REFLEX TO CULTURE (INFECTION SUSPECTED)
Bilirubin Urine: NEGATIVE
Glucose, UA: >=500 mg/dL — AB
Ketones, ur: NEGATIVE mg/dL
Nitrite: NEGATIVE
Protein, ur: 100 mg/dL — AB
Specific Gravity, Urine: 1.017 (ref 1.005–1.030)
WBC, UA: >50 WBC/hpf (ref 0–5)
pH: 5 (ref 5.0–8.0)

## 2024-07-14 LAB — COMPREHENSIVE METABOLIC PANEL WITH GFR
ALT: 14 U/L (ref 0–44)
AST: 21 U/L (ref 15–41)
Albumin: 3.1 g/dL — ABNORMAL LOW (ref 3.5–5.0)
Alkaline Phosphatase: 72 U/L (ref 38–126)
Anion gap: 13 (ref 5–15)
BUN: 8 mg/dL (ref 8–23)
CO2: 22 mmol/L (ref 22–32)
Calcium: 9.1 mg/dL (ref 8.9–10.3)
Chloride: 104 mmol/L (ref 98–111)
Creatinine, Ser: 0.95 mg/dL (ref 0.44–1.00)
GFR, Estimated: >60 mL/min (ref >60)
Glucose, Bld: 195 mg/dL — ABNORMAL HIGH (ref 70–99)
Potassium: 3.2 mmol/L — ABNORMAL LOW (ref 3.5–5.1)
Sodium: 139 mmol/L (ref 135–145)
Total Bilirubin: 0.7 mg/dL (ref 0.0–1.2)
Total Protein: 6.9 g/dL (ref 6.5–8.1)

## 2024-07-14 LAB — CBC WITH DIFFERENTIAL/PLATELET
Abs Immature Granulocytes: 0.02 K/uL (ref 0.00–0.07)
Basophils Absolute: 0 K/uL (ref 0.0–0.1)
Basophils Relative: 1 %
Eosinophils Absolute: 0 K/uL (ref 0.0–0.5)
Eosinophils Relative: 0 %
HCT: 36.4 % (ref 36.0–46.0)
Hemoglobin: 11.1 g/dL — ABNORMAL LOW (ref 12.0–15.0)
Immature Granulocytes: 0 %
Lymphocytes Relative: 42 %
Lymphs Abs: 3.6 K/uL (ref 0.7–4.0)
MCH: 25.8 pg — ABNORMAL LOW (ref 26.0–34.0)
MCHC: 30.5 g/dL (ref 30.0–36.0)
MCV: 84.5 fL (ref 80.0–100.0)
Monocytes Absolute: 0.6 K/uL (ref 0.1–1.0)
Monocytes Relative: 6 %
Neutro Abs: 4.3 K/uL (ref 1.7–7.7)
Neutrophils Relative %: 51 %
Platelets: 181 K/uL (ref 150–400)
RBC: 4.31 MIL/uL (ref 3.87–5.11)
RDW: 18.6 % — ABNORMAL HIGH (ref 11.5–15.5)
WBC: 8.5 K/uL (ref 4.0–10.5)
nRBC: 0 % (ref 0.0–0.2)

## 2024-07-14 LAB — RAPID URINE DRUG SCREEN, HOSP PERFORMED
Amphetamines: NOT DETECTED
Barbiturates: NOT DETECTED
Benzodiazepines: NOT DETECTED
Cocaine: NOT DETECTED
Opiates: NOT DETECTED
Tetrahydrocannabinol: POSITIVE — AB

## 2024-07-14 LAB — AMMONIA: Ammonia: <13 umol/L (ref 9–35)

## 2024-07-14 LAB — TSH: TSH: 0.162 u[IU]/mL — ABNORMAL LOW (ref 0.350–4.500)

## 2024-07-14 LAB — ETHANOL: Alcohol, Ethyl (B): <15 mg/dL (ref <15)

## 2024-07-14 LAB — CBG MONITORING, ED: Glucose-Capillary: 147 mg/dL — ABNORMAL HIGH (ref 70–99)

## 2024-07-14 LAB — LIPASE, BLOOD: Lipase: 24 U/L (ref 11–51)

## 2024-07-14 MED ORDER — ROSUVASTATIN CALCIUM 20 MG PO TABS
20.0000 mg | ORAL_TABLET | Freq: Every morning | ORAL | Status: DC
Start: 2024-07-15 — End: 2024-07-20
  Administered 2024-07-16 – 2024-07-20 (×5): 20 mg via ORAL
  Filled 2024-07-14 (×6): qty 1

## 2024-07-14 MED ORDER — ACETAMINOPHEN 325 MG PO TABS
650.0000 mg | ORAL_TABLET | Freq: Four times a day (QID) | ORAL | Status: DC | PRN
  Administered 2024-07-14 – 2024-07-18 (×7): 650 mg via ORAL
  Filled 2024-07-14 (×7): qty 2

## 2024-07-14 MED ORDER — SODIUM CHLORIDE 0.9 % IV BOLUS
1000.0000 mL | Freq: Once | INTRAVENOUS | Status: AC
  Administered 2024-07-14: 1000 mL via INTRAVENOUS

## 2024-07-14 MED ORDER — POTASSIUM CHLORIDE CRYS ER 20 MEQ PO TBCR
40.0000 meq | EXTENDED_RELEASE_TABLET | Freq: Once | ORAL | Status: AC
  Administered 2024-07-14: 40 meq via ORAL
  Filled 2024-07-14: qty 2

## 2024-07-14 MED ORDER — INSULIN ASPART 100 UNIT/ML IJ SOLN
0.0000 [IU] | Freq: Every day | INTRAMUSCULAR | Status: DC
  Administered 2024-07-17: 2 [IU] via SUBCUTANEOUS
  Administered 2024-07-19: 3 [IU] via SUBCUTANEOUS

## 2024-07-14 MED ORDER — INSULIN ASPART 100 UNIT/ML IJ SOLN
0.0000 [IU] | Freq: Three times a day (TID) | INTRAMUSCULAR | Status: DC
  Administered 2024-07-15 (×3): 3 [IU] via SUBCUTANEOUS
  Administered 2024-07-16: 5 [IU] via SUBCUTANEOUS
  Administered 2024-07-16: 11 [IU] via SUBCUTANEOUS
  Administered 2024-07-17: 5 [IU] via SUBCUTANEOUS
  Administered 2024-07-17: 8 [IU] via SUBCUTANEOUS
  Administered 2024-07-17: 2 [IU] via SUBCUTANEOUS
  Administered 2024-07-18: 3 [IU] via SUBCUTANEOUS
  Administered 2024-07-18: 8 [IU] via SUBCUTANEOUS
  Administered 2024-07-18: 11 [IU] via SUBCUTANEOUS
  Administered 2024-07-19: 8 [IU] via SUBCUTANEOUS
  Administered 2024-07-19 – 2024-07-20 (×2): 5 [IU] via SUBCUTANEOUS
  Administered 2024-07-20: 8 [IU] via SUBCUTANEOUS

## 2024-07-14 MED ORDER — SODIUM CHLORIDE 0.9 % IV SOLN
1.0000 g | Freq: Once | INTRAVENOUS | Status: AC
  Administered 2024-07-14: 1 g via INTRAVENOUS
  Filled 2024-07-14: qty 10

## 2024-07-14 MED ORDER — ENOXAPARIN SODIUM 40 MG/0.4ML IJ SOSY
40.0000 mg | PREFILLED_SYRINGE | INTRAMUSCULAR | Status: DC
  Administered 2024-07-14 – 2024-07-19 (×6): 40 mg via SUBCUTANEOUS
  Filled 2024-07-14 (×6): qty 0.4

## 2024-07-14 MED ORDER — HYDROMORPHONE HCL 1 MG/ML IJ SOLN
0.5000 mg | Freq: Once | INTRAMUSCULAR | Status: AC
  Administered 2024-07-14: 0.5 mg via INTRAVENOUS
  Filled 2024-07-14: qty 1

## 2024-07-14 MED ORDER — UMECLIDINIUM-VILANTEROL 62.5-25 MCG/ACT IN AEPB
1.0000 | INHALATION_SPRAY | Freq: Every day | RESPIRATORY_TRACT | Status: DC
  Administered 2024-07-17 – 2024-07-20 (×3): 1 via RESPIRATORY_TRACT
  Filled 2024-07-14 (×2): qty 14

## 2024-07-14 MED ORDER — ACETAMINOPHEN 650 MG RE SUPP: 650.0000 mg | Freq: Four times a day (QID) | RECTAL | Status: DC | PRN

## 2024-07-14 MED ORDER — ALBUTEROL SULFATE (2.5 MG/3ML) 0.083% IN NEBU: 3.0000 mL | INHALATION_SOLUTION | Freq: Four times a day (QID) | RESPIRATORY_TRACT | Status: DC | PRN

## 2024-07-14 MED ORDER — AMLODIPINE BESYLATE 10 MG PO TABS
10.0000 mg | ORAL_TABLET | Freq: Every day | ORAL | Status: DC
  Administered 2024-07-16 – 2024-07-20 (×5): 10 mg via ORAL
  Filled 2024-07-14 (×5): qty 1

## 2024-07-14 MED ORDER — HYDROXYZINE HCL 25 MG PO TABS
25.0000 mg | ORAL_TABLET | Freq: Three times a day (TID) | ORAL | Status: DC | PRN
  Administered 2024-07-14 – 2024-07-17 (×5): 25 mg via ORAL
  Filled 2024-07-14 (×5): qty 1

## 2024-07-14 MED ORDER — PANTOPRAZOLE SODIUM 40 MG PO TBEC
40.0000 mg | DELAYED_RELEASE_TABLET | Freq: Every day | ORAL | Status: DC
  Administered 2024-07-16 – 2024-07-20 (×5): 40 mg via ORAL
  Filled 2024-07-14 (×5): qty 1

## 2024-07-14 MED ORDER — POLYETHYLENE GLYCOL 3350 17 G PO PACK: 17.0000 g | PACK | Freq: Every day | ORAL | Status: DC | PRN

## 2024-07-14 MED ORDER — SPIRONOLACTONE 25 MG PO TABS
50.0000 mg | ORAL_TABLET | Freq: Every day | ORAL | Status: AC
Start: 2024-07-14 — End: ?
  Administered 2024-07-16 – 2024-07-20 (×5): 50 mg via ORAL
  Filled 2024-07-14 (×5): qty 2

## 2024-07-14 NOTE — ED Notes (Signed)
Pt resting with eyes closed; respirations spontaneous, even, unlabored; sitter at bedside  
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
## 2024-07-14 NOTE — H&P (Signed)
 Date: 07/15/2024               Patient Name:  Jennifer [name] MRN: [id number]  DOB: October 12, 1961 Age / Sex: 63 y.o., female   PCP: Trudy Mliss Dragon, MD         Medical Service: Internal Medicine Teaching Service         Attending Physician: Dr. Mliss Trudy      First Contact: Remonia Romano, DO    Second Contact: Dr. Ozell Nearing, DO          Pager Information: First Contact Pager: [phone]   Second Contact Pager: [phone]   SUBJECTIVE   Chief Complaint: Confusion  History of Present Illness: Jennifer [name] is a 63 y.o. female with PMH of COPD, DM, bilateral lower extremity amputations, who was brought in by EMS for confusion and altered mentation as reported by her son who called the EMS for her. We saw pt at bedside, she appeared anxious and was constantly asking for help. Patient was following our commands but was not an accurate historian as all her answers to our questions were I don't know or I need help.  When asked if she had any pain, patient mentioned that she has pain everywhere.  We tried calling son several times without any answer.  We were able to contact the father but he was not able to give us  any pertinent information about the patient. Per triage notes, patient's son called EMS because she was confused and inconsolable for the past 2 to 3 days.  Notes also showed that she recently left a SNF about a month ago for rehab for right BKA.   ED Course: Labs significant for: K: 3.2, glucose: 195, Hgb: 11.1; UA: Leukocytes, many bacteria; TSH: 0.162; UDS positive for THC Imaging : CT head did not show any acute abnormalities.  CT abdomen did not show any acute findings.  Increased size of 5 cm heterogenous lesion present at the mid pole of right kidney. Received: Ceftriaxone , Dilaudid , normal saline bolus Consulted IMTS for admission  Past Medical History: Per charting: COPD,CHF, DM, hypertension, hyperlipidemia, bilateral lower extremity amputations, PAD,  Chronic back pain, COPD, Depression, GERD, CKD2, Urinary incontinence, Gastroparesis  Meds:  Per charting: Tylenol  650 mg (2 tablets) every 8 hours as needed Albuterol  inhaler 2 puffs every 6 hours as needed Amlodipine  10 mg daily Baclofen  5 mg twice daily as needed Dexcom G7 Cymbalta  60 mg daily Flonase  twice daily Tresiba  65 units daily-unclear which insulin  patient actually takes Lantus  65 units daily Lidocaine  patch Metformin  500 mg daily Robaxin  500 mg 3 times daily as needed Reglan  5 mg 3 times daily Metoprolol  succinate 100 mg daily (cause Myrbetriq  50 mg daily Zofran  4 mg every 8 hours as needed Oxybutynin  10 mg daily Oxycodone  5 mg every 4 hours as needed Pantoprazole  40 mg daily Lyrica  225 mg 3 times daily Crestor  20 mg daily Semaglutide  1 mg weekly Silvadene  Spironolactone  50 mg daily Trazodone  200 mg nightly Anoro Ellipta  1 puff daily   Past Surgical History Past Surgical History:  Procedure Laterality Date   ABDOMINAL AORTOGRAM W/LOWER EXTREMITY N/A 03/28/2024   Procedure: ABDOMINAL AORTOGRAM W/LOWER EXTREMITY;  Surgeon: Pearline Norman RAMAN, MD;  Location: [facility];  Service: Cardiovascular;  Laterality: N/A;   ABDOMINAL HYSTERECTOMY  1997   secondary to uterine fibroids   AMPUTATION Left 08/31/2013   Procedure: AMPUTATION RAY;  Surgeon: Jerona LULLA Sage, MD;  Location: [facility] OR;  Service: Orthopedics;  Laterality:  Left;  Left Foot 5th Ray Amputation   AMPUTATION Left 09/28/2013   Procedure: Left Midfoot amputation;  Surgeon: Jerona LULLA Sage, MD;  Location: [facility] OR;  Service: Orthopedics;  Laterality: Left;  Left Midfoot amputation   AMPUTATION Left 10/14/2013   Procedure: AMPUTATION BELOW KNEE- left;  Surgeon: Jerona LULLA Sage, MD;  Location: [facility] OR;  Service: Orthopedics;  Laterality: Left;  Left Below Knee Amputation    AMPUTATION Right 04/11/2024   Procedure: RIGHT AMPUTATION BELOW KNEE;  Surgeon: Lanis Fonda BRAVO, MD;  Location: [facility] OR;  Service: Vascular;   Laterality: Right;   AMPUTATION TOE Right 01/15/2017   Procedure: AMPUTATION 5th TOE RIGHT FOOT;  Surgeon: Thresa CHRISTELLA Sar, DPM;  Location: [facility] OR;  Service: Podiatry;  Laterality: Right;   AMPUTATION TOE Right 02/12/2023   Procedure: Right Foot Transmetatarsal Amputation;  Surgeon: Gershon Donnice SAUNDERS, DPM;  Location: [facility] OR;  Service: Podiatry;  Laterality: Right;   APPLICATION OF WOUND VAC  04/01/2019   Procedure: Application Of Wound Vac;  Surgeon: Sage Jerona LULLA, MD;  Location: [facility] OR;  Service: Orthopedics;;   BLADDER SURGERY     bladder reconstruction surgery   BOTOX  INJECTION N/A 08/21/2021   Procedure: CYSTOSCOPY BOTOX  INJECTION;  Surgeon: Carolee Sherwood JONETTA DOUGLAS, MD;  Location: [facility] ORS;  Service: Urology;  Laterality: N/A;   BREAST BIOPSY     multiple-benign per pt   CATARACT EXTRACTION, BILATERAL     summer 2022   COLONOSCOPY     DILATION AND CURETTAGE OF UTERUS  1985   ESOPHAGOGASTRODUODENOSCOPY N/A 09/20/2013   Procedure: ESOPHAGOGASTRODUODENOSCOPY (EGD);  Surgeon: Gordy CHRISTELLA Starch, MD;  Location: [facility] ENDOSCOPY;  Service: Gastroenterology;  Laterality: N/A;   EYE SURGERY Bilateral 2022   Cataract removal in June and then July   FOOT AMPUTATION THROUGH METATARSAL Left 09/28/2013   GANGLION CYST EXCISION     multiple   LOWER EXTREMITY INTERVENTION Right 03/28/2024   Procedure: LOWER EXTREMITY INTERVENTION;  Surgeon: Pearline Norman RAMAN, MD;  Location: [facility] INVASIVE CV LAB;  Service: Cardiovascular;  Laterality: Right;   METATARSAL HEAD EXCISION Right 03/29/2024   Procedure: EXCISION, METATARSAL BONE, HEAD;  Surgeon: Malvin Marsa FALCON, DPM;  Location: [facility] OR;  Service: Orthopedics/Podiatry;  Laterality: Right;  Irrigation and Debridment right foot with partial fifth met resection, antibiotic beads, wound vac   PAROTIDECTOMY Right 05/14/2022   Procedure: PAROTIDECTOMY;  Surgeon: Carlie Clark, MD;  Location: [facility] OR;  Service: ENT;  Laterality: Right;   PERIPHERAL VASCULAR INTERVENTION     stents in  lower ext   PREAURICULAR CYST EXCISION N/A 05/14/2022   Procedure: EXCISION OF SCALP SKIN CYST, 1.5cm;  Surgeon: Carlie Clark, MD;  Location: [facility] OR;  Service: ENT;  Laterality: N/A;   SHOULDER ARTHROSCOPY Right 11/11/2019   RIGHT SHOULDER ARTHROSCOPY AND DEBRIDEMENT    SHOULDER ARTHROSCOPY Right 11/11/2019   Procedure: RIGHT SHOULDER ARTHROSCOPY AND DEBRIDEMENT;  Surgeon: Sage Jerona LULLA, MD;  [facility] OR;  Service: Orthopedics;  Laterality: Right;   SHOULDER ARTHROSCOPY W/ ROTATOR CUFF REPAIR Bilateral    2 on right one on left   SKIN SPLIT GRAFT Bilateral 05/13/2013   Procedure: Right and Left Foot Allograft Skin Graft;  Surgeon: Jerona LULLA Sage, MD;  Location: [facility] OR;  Service: Orthopedics;  Laterality: Bilateral;  Right and Left Foot Allograft Skin Graft   STUMP REVISION Left 04/01/2019   Procedure: REVISION LEFT BELOW KNEE AMPUTATION;  Surgeon: Sage Jerona LULLA, MD;  Location: [facility] OR;  Service: Orthopedics;  Laterality:  Left;   TEE WITHOUT CARDIOVERSION N/A 01/31/2013   Procedure: TRANSESOPHAGEAL ECHOCARDIOGRAM (TEE);  Surgeon: Vina LULLA Gull, MD;  Location: [facility] ENDOSCOPY;  Service: Cardiovascular;  Laterality: N/A;  Rm 3W25   TEE WITHOUT CARDIOVERSION N/A 03/10/2013   Procedure: TRANSESOPHAGEAL ECHOCARDIOGRAM (TEE);  Surgeon: Ezra GORMAN Shuck, MD;  Location: [facility] ENDOSCOPY;  Service: Cardiovascular;  Laterality: N/A;  Rm. [id number]   TOE AMPUTATION Left 08/31/2013   4TH & 5 TH TOE    TONSILLECTOMY     TUBAL LIGATION     WRIST SURGERY Right    for tumors (09/28/2013)     Social:  History obtained from father who said patient lives with son.  Unable to obtain more history from patient due to her mentation.  PCP: Trudy Mliss Dragon, MD    Family History:  Family History  Problem Relation Age of Onset   Diverticulosis Mother    Diabetes Mother    Hypertension Mother    Congestive Heart Failure Mother    Asthma Father    CAD Sister 61       MI at age 22 per patient.  However, she has  not had a stent or CABG.    Heart disease Sister        before age 46   Colon cancer Brother    Breast cancer Neg Hx    Colon polyps Neg Hx    Rectal cancer Neg Hx    Stomach cancer Neg Hx    Esophageal cancer Neg Hx      Allergies: Allergies as of 07/14/2024 - Reviewed 07/14/2024  Allergen Reaction Noted   Beef-derived drug products Diarrhea 04/11/2024   Benazepril  Swelling 06/14/2021   Chantix  [varenicline ] Other (See Comments) 05/05/2022   Ioversol  02/10/2023   Morphine sulfate  02/10/2023   Abilify  [aripiprazole ] Other (See Comments) 11/09/2015   Iohexol Other (See Comments) 08/17/2007   Ivp dye [iodinated contrast media] Other (See Comments) 05/10/2013    Review of Systems: A complete ROS was negative except as per HPI.   OBJECTIVE:   Physical Exam: Blood pressure (!) 144/63, pulse 73, temperature 98 F (36.7 C), temperature source Oral, resp. rate 18, height 4' 10 (1.473 m), weight 99 kg, SpO2 95%.  Constitutional: Patient appeared confused, had the same answer to every question HENT: normocephalic atraumatic Eyes: conjunctiva non-erythematous Cardiovascular: regular rate and rhythm, no m/r/g Pulmonary/Chest: normal work of breathing on room air, lungs clear to auscultation bilaterally Abdominal: soft, non-tender, non-distended MSK: normal bulk and tone, bilateral BKA appreciated Neurological: alert & oriented x 2, 5/5 strength in bilateral upper and lower extremities, able to follow commands Skin: warm and dry  Labs: CBC    Component Value Date/Time   WBC 8.5 07/14/2024 1600   RBC 4.31 07/14/2024 1600   HGB 11.1 (L) 07/14/2024 1600   HGB 12.2 09/01/2022 1000   HCT 36.4 07/14/2024 1600   HCT 38.5 09/01/2022 1000   PLT 181 07/14/2024 1600   PLT 175 09/01/2022 1000   MCV 84.5 07/14/2024 1600   MCV 93 09/01/2022 1000   MCH 25.8 (L) 07/14/2024 1600   MCHC 30.5 07/14/2024 1600   RDW 18.6 (H) 07/14/2024 1600   RDW 14.4 09/01/2022 1000   LYMPHSABS 3.6  07/14/2024 1600   LYMPHSABS 2.7 09/01/2022 1000   MONOABS 0.6 07/14/2024 1600   EOSABS 0.0 07/14/2024 1600   EOSABS 0.1 09/01/2022 1000   BASOSABS 0.0 07/14/2024 1600   BASOSABS 0.1 09/01/2022 1000     CMP  Component Value Date/Time   NA 139 07/14/2024 1600   NA 141 02/18/2024 0932   K 3.2 (L) 07/14/2024 1600   CL 104 07/14/2024 1600   CO2 22 07/14/2024 1600   GLUCOSE 195 (H) 07/14/2024 1600   BUN 8 07/14/2024 1600   BUN 10 02/18/2024 0932   CREATININE 0.95 07/14/2024 1600   CREATININE 0.68 01/31/2015 1641   CALCIUM  9.1 07/14/2024 1600   PROT 6.9 07/14/2024 1600   PROT 6.8 05/26/2023 1017   ALBUMIN  3.1 (L) 07/14/2024 1600   ALBUMIN  3.8 (L) 05/26/2023 1017   AST 21 07/14/2024 1600   ALT 14 07/14/2024 1600   ALKPHOS 72 07/14/2024 1600   BILITOT 0.7 07/14/2024 1600   BILITOT <0.2 05/26/2023 1017   GFRNONAA >60 07/14/2024 1600   GFRNONAA >89 01/31/2015 1641   GFRAA 54 (L) 10/25/2020 1131   GFRAA >89 01/31/2015 1641    Imaging:  CT Head Wo Contrast Result Date: 07/14/2024 CLINICAL DATA:  Provided history: Mental status change, unknown cause EXAM: CT HEAD WITHOUT CONTRAST TECHNIQUE: Contiguous axial images were obtained from the base of the skull through the vertex without intravenous contrast. RADIATION DOSE REDUCTION: This exam was performed according to the departmental dose-optimization program which includes automated exposure control, adjustment of the mA and/or kV according to patient size and/or use of iterative reconstruction technique. COMPARISON:  10/31/2023 FINDINGS: Brain: No intracranial hemorrhage, mass effect, or midline shift. Stable brain volume. No hydrocephalus. The basilar cisterns are patent. Stable periventricular white matter hypodensity. No evidence of territorial infarct or acute ischemia. No extra-axial or intracranial fluid collection. Vascular: Atherosclerosis of skullbase vasculature without hyperdense vessel or abnormal calcification. Skull:  Paranasal sinuses and mastoid air cells are clear. The visualized orbits are unremarkable. Sinuses/Orbits: Paranasal sinuses and mastoid air cells are clear. The visualized orbits are unremarkable. Other: None. IMPRESSION: 1. No acute intracranial abnormality. 2. Stable periventricular white matter hypodensity. Electronically Signed   By: Andrea Gasman M.D.   On: 07/14/2024 18:35   CT ABDOMEN PELVIS WO CONTRAST Result Date: 07/14/2024 CLINICAL DATA:  Acute abdominal pain for several days. EXAM: CT ABDOMEN AND PELVIS WITHOUT CONTRAST TECHNIQUE: Multidetector CT imaging of the abdomen and pelvis was performed following the standard protocol without IV contrast. RADIATION DOSE REDUCTION: This exam was performed according to the departmental dose-optimization program which includes automated exposure control, adjustment of the mA and/or kV according to patient size and/or use of iterative reconstruction technique. COMPARISON:  03/15/2024 FINDINGS: Lower chest: No acute findings. Hepatobiliary: No mass visualized on this unenhanced exam. Gallbladder is unremarkable. No evidence of biliary ductal dilatation. Pancreas: No mass or inflammatory process visualized on this unenhanced exam. Spleen:  Within normal limits in size. Adrenals/Urinary tract: No evidence of urolithiasis or hydronephrosis. A lesion with heterogeneous attenuation is seen in the midpole the right kidney which measures 5 cm. This appears increased in size from 3 cm in 2023. This cannot be characterized on this unenhanced exam, and could represent a proteinaceous cyst or solid renal mass. Unremarkable unopacified urinary bladder. Stomach/Bowel: No evidence of obstruction, inflammatory process, or abnormal fluid collections. Normal appendix visualized. Mild colonic diverticulosis again noted, without signs of diverticulitis. Vascular/Lymphatic: No pathologically enlarged lymph nodes identified. No evidence of abdominal aortic aneurysm. Reproductive:  Prior hysterectomy noted. Adnexal regions are unremarkable in appearance. Other:  None. Musculoskeletal:  No suspicious bone lesions identified. IMPRESSION: No evidence of urolithiasis, hydronephrosis, or other acute findings. Increased size of 5 cm heterogeneous lesion in midpole of right kidney, which  cannot be characterized on this unenhanced exam. This could represent a proteinaceous cyst or solid renal mass. Abdomen MRI without and with contrast is recommended for further characterization. Colonic diverticulosis, without radiographic evidence of diverticulitis. Electronically Signed   By: Norleen DELENA Kil M.D.   On: 07/14/2024 18:22     EKG: personally reviewed my interpretation is normal sinus rhythm.  No obvious changes when compared to previous EKG.  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 person living with a history of COPD, HLD, DM who presented with confusion and admitted for encephalopathy on hospital day 0    Encephalopathy Patient came in confused with AMS to the ED.  Her mentation improved when we saw her, however she appeared anxious and was not a good historian.  Her low potassium levels and TSH levels could have had a metabolic impact on her encephalopathy.  Most likely explanation is THC intoxication causing her symptoms.  Patient has a long list of medications including many centrally acting medications which along with  THC may have caused her encephalopathy. UA was positive for nitrites, bacteria but patient was unable to give history about symptoms, so we will hold off on further treatment.  Once mentation improves, can ask about urinary symptoms and start antibiotics if indicated as patient has already received 1 dose of antibiotics -- Continue Atarax  25 mg as needed for anxiety -- Continue one-to-one sitter for safety observation -- Hold home centrally acting medications -- Potassium chloride  40 mEq received -- Follow-up a.m. CBC, BMP -- Follow-up a.m. magnesium  levels -- Follow-up a.m. free T4 levels -- Monitor mentation -- Attempt to call Son Paulett in the AM to obtain more collateral   Hypokalemia Hypokalemic to 3.2.  Likely in setting of poor p.o. intake.  Will replete.  Will check magnesium . --Follow-up BMP --Follow-up magnesium  levels --40 mEq KCl provided    Major depression in partial remission (HCC) Home duloxetine , might worsen patient's mentation as it has effects on CNS.  Resume once mentation improves. --Hold home duloxetine  60 mg     Hypertension Pressure around 1 AM 137/148. 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  Resume once mentation improves. --Hold home pregabalin  200 milligrams 3 times daily    CKD stage 2 due to type 2 diabetes mellitus (HCC) BUN, creatinine, GFR levels are within normal limits -- Monitor BUN, creatinine, GFR during a.m. labs    Urinary incontinence Myrbetriq  might worsen patient's mentation as it has effects on CNS.  Resume once mentation improves. --Hold home Myrbetriq  50 Mg tablet      Type 2 diabetes mellitus (HCC) Patient's blood glucose was elevated at 195 but within her baseline baseline of 408-213.  --Started sliding scale insulin   NovoLog . --Home medications does include 65 units of long-acting insulin , however medication list shows both Tresiba  and Lantus , will need to decipher which one patient is actually taking   Hyperlipidemia associated with type 2 diabetes mellitus (HCC)  Peripheral arterial disease with history of revascularization (HCC) --Continue home rosuvastatin  20 mg    COPD (chronic obstructive pulmonary disease) (HCC) --Continue home Anora Ellipta 62.5 - 25 mcg    Gastroparesis, likely secondary to diabetes --Hold home Reglan  10 mg, until we can confirm medications    Renal lesion CT abdomen/pelvis showed 5 cm heterogenous lesion in the midpole of right kidney.  We characterized in the scan.  Could represent proteinaceous cyst or renal mass.  Follow-up with outpatient MRI for further clarification. -- Follow-up outpatient MRI w or wo contrast further workup of left kidney lesion    Polypharmacy Patient is on many medications, and has not seen her primary care physician since March 2025.  Would recommend follow-up with outpatient doctor to see if we can potentially discontinue some medications to avoid this problem in the future. - Patient will benefit from medication reconciliation appointment  Best practice: Diet: Normal VTE: Enoxaparin  IVF: None,None Code: Full  Disposition planning: Prior to Admission Living Arrangement: Home Anticipated Discharge Location: Home  Dispo: Admit patient to Observation with expected length of stay less than 2 midnights.  Signed: Edgardo Pontiff, DO Internal Medicine Resident  07/15/2024, 2:21 AM  Please contact IM Residency On-Call Pager at: [phone] or [phone].

## 2024-07-14 NOTE — ED Triage Notes (Signed)
 The Patient arrives via EMS with CC of AMS/Psychiatric issue. The patient is writhing in the bed and calling out continuously for someone to help her. VSS at this time. Her son called EMS because she has been inconsolable and confused for the last 2-3 days. She recently left a SNF about a month ago. The patient has a hx of COPD and diabetes and is a bilateral LE amputee.

## 2024-07-14 NOTE — ED Notes (Signed)
 Safety sitter at bedside

## 2024-07-14 NOTE — Hospital Course (Addendum)
 Jennifer [name] is a 63 y.o. person living with a history of COPD, HLD, DM who presented with confusion and admitted for encephalopathy.   #Labile mood vs Encephalopathy, unclear etiology  #Delirium  #UTI Presented with confusion and AMS from home. Encephalopathy workup mostly negative with normal renal and hepatic function. TSH mildly decreased with minimally elevated free T4. No acidosis. CT head without acute. UDS did note THC. Patient is on several centrally acting medications and some changes after recently discharge from SNF. She is A&Ox3 but distraught and tearful. HDS and has been afebrile with no leukocytosis. Asymptomatic bacteruria but did empirically treat with CTX given hx. Blood cultures negative. Labile mood improved.  MRI brain showed acute posterior left MCA infarct, predominantly involving the posterior insula and overlying left parietal lobe. Neurology and stroke team consulted. Started patient on Aspirin  and Plavix  and began embolic stroke workup. This included MR Angio Head, TEE, bilateral carotid doppler ultrasound, and working up risk factors like A1c and LDL. LDL 36. Patient was started on Depakote  500mg  and headache cocktail for new headache post-stroke. MR Angio Head showed mild attenuation of distal MCA branch vessels, worse on the left. She had moderate narrowing in the distal PCA branch vessels bilaterally. Echo showed EF of 65-75% with mild left ventricular hypertrophy and no valvular involvement. Carotid dopplers showed normal to mild stenosis in the bilateral ICAs, but otherwise normal. TEE was completed and shows right-to-left interatrial shunting, with a ~2.0cm small patent foramen ovale. Cardiology consulted for PFO and recommends outpatient follow up. Negligible risk for stroke per neurology. Lower extremity DVT US  and US  bubble study ordered. ***.  PT/OT evaluated and recommend home health PT/OT.  SLP evaluated and recommended thin diet and continued SLP  services.  Patient improved ***. Centrally acting medications were ***.     #Major depression in partial remission (HCC) Initially held home duloxetine . Patient's mental state could be playing apart in her presentation. Denies any changes in home environment or acute issues with son. Did leave SNF about 1 month ago and noted by son to have depressed mood and not herself which she told him felt like she was in shock when she was surrounded by people at SNF with similar conditions/situations. Son reports at home that patient was staring off into space, yelling at people, and for the last couple of days was refusing to eat and drink after returning SNF. Questioned if this is contributing given labile affect. Restarted home duloxetine  60 mg daily. Patient's mood seemed to improved.   #Hypertension  BP mildly hypertensive. Continue home amlodipine  10 mg, spironolactone  50 mg daily. ***   #Polypharmacy  Patient is on many medications and those medications could have changed when leaving nursing facility. Med rec was ***.    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 Reports bilateral stump pain that she reports is her chronic pain. Noted her pregabalin  dose was increased recently to 225 mg TID from 200 mg TID per PDMP report. Restarted pregabalin  at 25mg  TID. Oxycodone  2.5mg  IR was restarted for pain. Patient reported moderate improvement of her pain today on lowered Oxycodone  dose.   #Urinary Incontinence Patient is prescribed Myrbetriq  and Oxybutynin . Will hold these medications at this time. Bladder scan so far not retaining ***. Completed antibiotics for presumed UTI.    #Decreased TSH  Mildly decreased TSH, free T4 mildly elevated. Less suspicious that this is contributing to current presentation. This can be  followed up outpatient.   #Type 2 Diabetes Mellitus A1c 9.8. PTA on long acting insulin  reported 65 units daily,  metformin  500 mg daily and Ozempic  1 mg weekly. Placed on 20 units Semglee  while inpatient, which was raised to 25 units. Patient was discharged on insulin  *** units ***.    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 continue home anora ellipta  Gastroparesis, likely secondary to diabetes- holding home Reglan  10 mg at this time *** R Renal lesion - 5 cm mass lesion in the midpole of right kidney. Will need follow up with outpatient MRI. ***  ***

## 2024-07-14 NOTE — ED Notes (Signed)
 Admitting team at bedside.

## 2024-07-15 ENCOUNTER — Other Ambulatory Visit: Payer: Self-pay

## 2024-07-15 ENCOUNTER — Encounter (HOSPITAL_COMMUNITY): Payer: Self-pay | Admitting: Internal Medicine

## 2024-07-15 DIAGNOSIS — J4489 Other specified chronic obstructive pulmonary disease: Secondary | ICD-10-CM | POA: Diagnosis present

## 2024-07-15 DIAGNOSIS — G936 Cerebral edema: Secondary | ICD-10-CM | POA: Diagnosis not present

## 2024-07-15 DIAGNOSIS — M5416 Radiculopathy, lumbar region: Secondary | ICD-10-CM | POA: Diagnosis not present

## 2024-07-15 DIAGNOSIS — G9341 Metabolic encephalopathy: Secondary | ICD-10-CM | POA: Diagnosis present

## 2024-07-15 DIAGNOSIS — I081 Rheumatic disorders of both mitral and tricuspid valves: Secondary | ICD-10-CM | POA: Diagnosis not present

## 2024-07-15 DIAGNOSIS — Z8616 Personal history of COVID-19: Secondary | ICD-10-CM | POA: Diagnosis not present

## 2024-07-15 DIAGNOSIS — M86171 Other acute osteomyelitis, right ankle and foot: Secondary | ICD-10-CM | POA: Diagnosis not present

## 2024-07-15 DIAGNOSIS — E785 Hyperlipidemia, unspecified: Secondary | ICD-10-CM | POA: Diagnosis not present

## 2024-07-15 DIAGNOSIS — F12129 Cannabis abuse with intoxication, unspecified: Secondary | ICD-10-CM | POA: Diagnosis present

## 2024-07-15 DIAGNOSIS — Z7984 Long term (current) use of oral hypoglycemic drugs: Secondary | ICD-10-CM | POA: Diagnosis not present

## 2024-07-15 DIAGNOSIS — R279 Unspecified lack of coordination: Secondary | ICD-10-CM | POA: Diagnosis not present

## 2024-07-15 DIAGNOSIS — R32 Unspecified urinary incontinence: Secondary | ICD-10-CM | POA: Diagnosis not present

## 2024-07-15 DIAGNOSIS — I253 Aneurysm of heart: Secondary | ICD-10-CM | POA: Diagnosis present

## 2024-07-15 DIAGNOSIS — R29701 NIHSS score 1: Secondary | ICD-10-CM | POA: Diagnosis not present

## 2024-07-15 DIAGNOSIS — I10 Essential (primary) hypertension: Secondary | ICD-10-CM | POA: Diagnosis not present

## 2024-07-15 DIAGNOSIS — I6389 Other cerebral infarction: Secondary | ICD-10-CM | POA: Diagnosis not present

## 2024-07-15 DIAGNOSIS — E1351 Other specified diabetes mellitus with diabetic peripheral angiopathy without gangrene: Secondary | ICD-10-CM | POA: Diagnosis not present

## 2024-07-15 DIAGNOSIS — I371 Nonrheumatic pulmonary valve insufficiency: Secondary | ICD-10-CM | POA: Diagnosis present

## 2024-07-15 DIAGNOSIS — E1151 Type 2 diabetes mellitus with diabetic peripheral angiopathy without gangrene: Secondary | ICD-10-CM | POA: Diagnosis present

## 2024-07-15 DIAGNOSIS — N182 Chronic kidney disease, stage 2 (mild): Secondary | ICD-10-CM | POA: Diagnosis not present

## 2024-07-15 DIAGNOSIS — I69391 Dysphagia following cerebral infarction: Secondary | ICD-10-CM | POA: Diagnosis not present

## 2024-07-15 DIAGNOSIS — F17210 Nicotine dependence, cigarettes, uncomplicated: Secondary | ICD-10-CM | POA: Diagnosis not present

## 2024-07-15 DIAGNOSIS — J441 Chronic obstructive pulmonary disease with (acute) exacerbation: Secondary | ICD-10-CM | POA: Diagnosis not present

## 2024-07-15 DIAGNOSIS — I6623 Occlusion and stenosis of bilateral posterior cerebral arteries: Secondary | ICD-10-CM | POA: Diagnosis not present

## 2024-07-15 DIAGNOSIS — Z89431 Acquired absence of right foot: Secondary | ICD-10-CM | POA: Diagnosis not present

## 2024-07-15 DIAGNOSIS — I6523 Occlusion and stenosis of bilateral carotid arteries: Secondary | ICD-10-CM | POA: Diagnosis not present

## 2024-07-15 DIAGNOSIS — I130 Hypertensive heart and chronic kidney disease with heart failure and stage 1 through stage 4 chronic kidney disease, or unspecified chronic kidney disease: Secondary | ICD-10-CM | POA: Diagnosis not present

## 2024-07-15 DIAGNOSIS — Z6841 Body Mass Index (BMI) 40.0 and over, adult: Secondary | ICD-10-CM | POA: Diagnosis not present

## 2024-07-15 DIAGNOSIS — R29702 NIHSS score 2: Secondary | ICD-10-CM | POA: Diagnosis not present

## 2024-07-15 DIAGNOSIS — F324 Major depressive disorder, single episode, in partial remission: Secondary | ICD-10-CM | POA: Diagnosis present

## 2024-07-15 DIAGNOSIS — E119 Type 2 diabetes mellitus without complications: Secondary | ICD-10-CM | POA: Diagnosis not present

## 2024-07-15 DIAGNOSIS — I63512 Cerebral infarction due to unspecified occlusion or stenosis of left middle cerebral artery: Secondary | ICD-10-CM | POA: Diagnosis not present

## 2024-07-15 DIAGNOSIS — J449 Chronic obstructive pulmonary disease, unspecified: Secondary | ICD-10-CM | POA: Diagnosis not present

## 2024-07-15 DIAGNOSIS — Q2112 Patent foramen ovale: Secondary | ICD-10-CM | POA: Diagnosis not present

## 2024-07-15 DIAGNOSIS — R7881 Bacteremia: Secondary | ICD-10-CM | POA: Diagnosis not present

## 2024-07-15 DIAGNOSIS — I63412 Cerebral infarction due to embolism of left middle cerebral artery: Secondary | ICD-10-CM | POA: Diagnosis not present

## 2024-07-15 DIAGNOSIS — Z95820 Peripheral vascular angioplasty status with implants and grafts: Secondary | ICD-10-CM | POA: Diagnosis not present

## 2024-07-15 DIAGNOSIS — G9340 Encephalopathy, unspecified: Secondary | ICD-10-CM | POA: Diagnosis not present

## 2024-07-15 DIAGNOSIS — I5032 Chronic diastolic (congestive) heart failure: Secondary | ICD-10-CM | POA: Diagnosis not present

## 2024-07-15 DIAGNOSIS — Z89512 Acquired absence of left leg below knee: Secondary | ICD-10-CM | POA: Diagnosis not present

## 2024-07-15 DIAGNOSIS — M0800 Unspecified juvenile rheumatoid arthritis of unspecified site: Secondary | ICD-10-CM | POA: Diagnosis present

## 2024-07-15 DIAGNOSIS — N390 Urinary tract infection, site not specified: Secondary | ICD-10-CM | POA: Diagnosis not present

## 2024-07-15 DIAGNOSIS — E1143 Type 2 diabetes mellitus with diabetic autonomic (poly)neuropathy: Secondary | ICD-10-CM | POA: Diagnosis not present

## 2024-07-15 DIAGNOSIS — B961 Klebsiella pneumoniae [K. pneumoniae] as the cause of diseases classified elsewhere: Secondary | ICD-10-CM | POA: Diagnosis present

## 2024-07-15 DIAGNOSIS — E1142 Type 2 diabetes mellitus with diabetic polyneuropathy: Secondary | ICD-10-CM | POA: Diagnosis present

## 2024-07-15 DIAGNOSIS — E1122 Type 2 diabetes mellitus with diabetic chronic kidney disease: Secondary | ICD-10-CM | POA: Diagnosis present

## 2024-07-15 DIAGNOSIS — I639 Cerebral infarction, unspecified: Secondary | ICD-10-CM | POA: Diagnosis not present

## 2024-07-15 DIAGNOSIS — R269 Unspecified abnormalities of gait and mobility: Secondary | ICD-10-CM | POA: Diagnosis not present

## 2024-07-15 DIAGNOSIS — K760 Fatty (change of) liver, not elsewhere classified: Secondary | ICD-10-CM | POA: Diagnosis present

## 2024-07-15 DIAGNOSIS — R2689 Other abnormalities of gait and mobility: Secondary | ICD-10-CM | POA: Diagnosis not present

## 2024-07-15 DIAGNOSIS — E1165 Type 2 diabetes mellitus with hyperglycemia: Secondary | ICD-10-CM | POA: Diagnosis present

## 2024-07-15 DIAGNOSIS — I6340 Cerebral infarction due to embolism of unspecified cerebral artery: Secondary | ICD-10-CM | POA: Diagnosis not present

## 2024-07-15 DIAGNOSIS — G546 Phantom limb syndrome with pain: Secondary | ICD-10-CM | POA: Diagnosis present

## 2024-07-15 DIAGNOSIS — R29700 NIHSS score 0: Secondary | ICD-10-CM | POA: Diagnosis not present

## 2024-07-15 DIAGNOSIS — F1210 Cannabis abuse, uncomplicated: Secondary | ICD-10-CM | POA: Diagnosis not present

## 2024-07-15 LAB — CBG MONITORING, ED: Glucose-Capillary: 162 mg/dL — ABNORMAL HIGH (ref 70–99)

## 2024-07-15 LAB — CBC
HCT: 33.8 % — ABNORMAL LOW (ref 36.0–46.0)
Hemoglobin: 10.4 g/dL — ABNORMAL LOW (ref 12.0–15.0)
MCH: 26 pg (ref 26.0–34.0)
MCHC: 30.8 g/dL (ref 30.0–36.0)
MCV: 84.5 fL (ref 80.0–100.0)
Platelets: 180 K/uL (ref 150–400)
RBC: 4 MIL/uL (ref 3.87–5.11)
RDW: 18.8 % — ABNORMAL HIGH (ref 11.5–15.5)
WBC: 8.6 K/uL (ref 4.0–10.5)
nRBC: 0 % (ref 0.0–0.2)

## 2024-07-15 LAB — BASIC METABOLIC PANEL WITH GFR
Anion gap: 10 (ref 5–15)
BUN: 6 mg/dL — ABNORMAL LOW (ref 8–23)
CO2: 24 mmol/L (ref 22–32)
Calcium: 8.8 mg/dL — ABNORMAL LOW (ref 8.9–10.3)
Chloride: 107 mmol/L (ref 98–111)
Creatinine, Ser: 0.92 mg/dL (ref 0.44–1.00)
GFR, Estimated: >60 mL/min (ref >60)
Glucose, Bld: 135 mg/dL — ABNORMAL HIGH (ref 70–99)
Potassium: 2.9 mmol/L — ABNORMAL LOW (ref 3.5–5.1)
Sodium: 141 mmol/L (ref 135–145)

## 2024-07-15 LAB — GLUCOSE, CAPILLARY
Glucose-Capillary: 180 mg/dL — ABNORMAL HIGH (ref 70–99)
Glucose-Capillary: 171 mg/dL — ABNORMAL HIGH (ref 70–99)
Glucose-Capillary: 142 mg/dL — ABNORMAL HIGH (ref 70–99)

## 2024-07-15 LAB — MAGNESIUM: Magnesium: 1.3 mg/dL — ABNORMAL LOW (ref 1.7–2.4)

## 2024-07-15 LAB — MRSA NEXT GEN BY PCR, NASAL: MRSA by PCR Next Gen: NOT DETECTED

## 2024-07-15 LAB — T4, FREE: Free T4: 1.18 ng/dL — ABNORMAL HIGH (ref 0.61–1.12)

## 2024-07-15 MED ORDER — LACTATED RINGERS IV SOLN: INTRAVENOUS | Status: AC

## 2024-07-15 MED ORDER — POTASSIUM CHLORIDE CRYS ER 20 MEQ PO TBCR
40.0000 meq | EXTENDED_RELEASE_TABLET | Freq: Two times a day (BID) | ORAL | Status: DC
  Administered 2024-07-15: 40 meq via ORAL
  Filled 2024-07-15: qty 2

## 2024-07-15 MED ORDER — SENNA 8.6 MG PO TABS
1.0000 | ORAL_TABLET | Freq: Every day | ORAL | Status: DC
  Administered 2024-07-15 – 2024-07-20 (×3): 8.6 mg via ORAL
  Filled 2024-07-15 (×4): qty 1

## 2024-07-15 MED ORDER — MAGNESIUM SULFATE 4 GM/100ML IV SOLN
4.0000 g | Freq: Once | INTRAVENOUS | Status: AC
  Administered 2024-07-15: 4 g via INTRAVENOUS
  Filled 2024-07-15: qty 100

## 2024-07-15 NOTE — TOC CM/SW Note (Addendum)
 Transition of Care [facility]) - Inpatient Brief Assessment   Patient Details  Name: Jennifer [name] MRN: [id number] Date of Birth: [birth year]  [facility]) CM/SW Contact:    Lauraine FORBES Saa, LCSW Phone Number: 07/15/2024, 11:31 AM   Clinical Narrative:  11:31 AM Per chart review, patient resides at home with child(ren). Patient has a PCP and insurance. Patient has SNF history with [facility], [address], [facility], and [facility]. Patient has HH history with [facility], [facility], [facility], and [facility]. Patient has DME (rolling walker, BSC, nebulizer machine, crutches, tub bench, manual wheelchair, L BKA sleep and supplies, L prosthesis replacement, electric wheelchair, access ramp, electric wheelchair, CPAP) with [facility], [facility], and [facility]. Patient's preferred pharmacy's are [facility] and [facility]. Patient is not fully oriented and is unable to answer SDOH/admission questions. Physical therapy recommended patient discharge home with [facility]. TOC will continue to follow and be available to assist.  Transition of Care Asessment: Insurance and Status: Insurance coverage has been reviewed Patient has primary care physician: Yes Home environment has been reviewed: Private Residence Prior level of function:: Unknown Prior/Current Home Services: No current home services (Has HH/DME history) Social Drivers of Health Review:  (Patient unable to answer) Readmission risk has been reviewed: Yes Transition of care needs: transition of care needs identified, TOC will continue to follow

## 2024-07-15 NOTE — Plan of Care (Signed)
  Problem: Skin Integrity: Goal: Risk for impaired skin integrity will decrease Outcome: Progressing   Problem: Nutrition: Goal: Adequate nutrition will be maintained Outcome: Progressing   Problem: Elimination: Goal: Will not experience complications related to bowel motility Outcome: Progressing Goal: Will not experience complications related to urinary retention Outcome: Progressing   Problem: Pain Managment: Goal: General experience of comfort will improve and/or be controlled Outcome: Progressing

## 2024-07-15 NOTE — ED Notes (Signed)
 CCMD notified. Pt is on monitor.
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
## 2024-07-15 NOTE — Evaluation (Signed)
 Physical Therapy Evaluation Patient Details Name: Jennifer [name] MRN: [id number] DOB: 1961/05/13 Today's Date: 07/15/2024  History of Present Illness  Pt is a 63 y/o F admitted on 07/14/24 after presenting with c/o AMS. Pt is being treated for encephalopathy. 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 Pt is oriented to her name, follows one step commands inconsistently with extra time & multimodal cuing. Pt is able to reposition in bed with cuing but no physical assistance. Pt reports she lives with her son & notes she gets in a w/c but unable to provide any other information. Pt assisted to sitting semi fowler in bed, set up with meal tray. Pt left with TV & lights on, door open, to increase alertness. Will follow pt acutely & progress mobility as able.        If plan is discharge home, recommend the following: A little help with walking and/or transfers;Assistance with cooking/housework;Assist for transportation;Help with stairs or ramp for entrance;Supervision due to cognitive status   Can travel by private vehicle        Equipment Recommendations Other (comment) (TBD)  Recommendations for Other Services       Functional Status Assessment Patient has had a recent decline in their functional status and demonstrates the ability to make significant improvements in function in a reasonable and predictable amount of time.     Precautions / Restrictions Precautions Precautions: Fall Precaution/Restrictions Comments: BLE amputations Restrictions Weight Bearing Restrictions Per Provider Order: No      Mobility  Bed Mobility Overal bed mobility: Needs Assistance Bed Mobility: Supine to Sit, Sit to Supine     Supine to sit: Min assist (min assist  2/2 pt requiring tactile cuing/manual facilitation to initiate) Sit to supine: Modified independent (Device/Increase time), HOB elevated, Used rails        Transfers                   General transfer comment: Pt able to long sit in bed, turn self around & scoot posteriorly with cuing to initiate/continue until she's straight in the bed. Does not use bed rails to assist.    Ambulation/Gait                  Stairs            Wheelchair Mobility     Tilt Bed    Modified Rankin (Stroke Patients Only)       Balance Overall balance assessment: Needs assistance Sitting-balance support: Bilateral upper extremity supported Sitting balance-Leahy Scale: Good Sitting balance - Comments: able to long sit in bed without LOB                                     Pertinent Vitals/Pain Pain Assessment Pain Assessment: Faces Faces Pain Scale: No hurt    Home Living Family/patient expects to be discharged to:: Private residence Living Arrangements: Children   Type of Home: House Home Access: Ramped entrance       Home Layout: One level Home Equipment: Rollator (4 wheels);Tub bench;Wheelchair - power Additional Comments: Pt is a poor historian, reports she lives with her son but all information taken from previous  chart entry. Need to confirm with family.    Prior Function Prior Level of Function : Patient poor historian/Family not available             Mobility Comments: Pt reports she does get OOB to a w/c but unable to state how she transfers.       Extremity/Trunk Assessment   Upper Extremity Assessment Upper Extremity Assessment: Generalized weakness    Lower Extremity Assessment Lower Extremity Assessment: Generalized weakness (chronic BLE amputations)       Communication   Communication Factors Affecting Communication: Difficulty expressing self    Cognition Arousal: Lethargic Behavior During Therapy: Impulsive    PT - Cognitive impairments: Difficult to assess, Orientation, Awareness, Memory, Attention, Initiation, Sequencing, Problem solving, Safety/Judgement Difficult to assess due to: Level of arousal Orientation impairments: Person, Place, Situation                   PT - Cognition Comments: aware she's at [facility], unable to recall her age or birthday. Following commands: Impaired Following commands impaired: Follows one step commands with increased time, Follows multi-step commands inconsistently     Cueing Cueing Techniques: Verbal cues, Gestural cues, Tactile cues, Visual cues     General Comments      Exercises     Assessment/Plan    PT Assessment Patient needs continued PT services  PT Problem List Decreased activity tolerance;Decreased mobility;Decreased cognition;Decreased safety awareness       PT Treatment Interventions DME instruction;Balance training;Neuromuscular re-education;Functional mobility training;Therapeutic activities;Therapeutic exercise;Wheelchair mobility training;Patient/family education    PT Goals (Current goals can be found in the Care Plan section)  Acute Rehab PT Goals PT Goal Formulation: Patient unable to participate in goal setting Time For Goal Achievement: 07/29/24 Potential to Achieve Goals: Good Additional Goals Additional Goal #1: Pt will propel w/c x 150 ft with mod I to increase independence with mobility.    Frequency Min 2X/week     Co-evaluation               AM-PAC PT 6 Clicks Mobility  Outcome Measure Help needed turning from your back to your side while in a flat bed without using bedrails?: None Help needed moving from lying on your back to sitting on the side of a flat bed without using bedrails?: A Little Help needed moving to and from a bed to a chair (including a wheelchair)?: A Little Help needed standing up from a chair using your arms (e.g., wheelchair or bedside chair)?: Total Help needed to  walk in hospital room?: Total Help needed climbing 3-5 steps with a railing? : Total 6 Click Score: 13    End of Session   Activity Tolerance: Patient limited by lethargy Patient left: in bed;with call bell/phone within reach (set up with meal tray, lights on, TV on, door open to increase alertness) Nurse Communication: Mobility status PT Visit Diagnosis: Other abnormalities of gait and mobility (R26.89)    Time: 9057-9047 PT Time Calculation (min) (ACUTE ONLY): 10 min   Charges:   PT Evaluation $PT Eval Moderate Complexity: 1 Mod   PT General Charges $$ ACUTE PT VISIT: 1 Visit         Richerd Pinal, PT, DPT 07/15/24, 10:27 AM   Richerd CHRISTELLA Pinal 07/15/2024, 10:25 AM

## 2024-07-15 NOTE — Progress Notes (Signed)
 HD#0 SUBJECTIVE:  Patient Summary: Jennifer [name] is a 63 y.o. person living with a history of COPD, HLD, DM who presented with confusion and admitted for encephalopathy.   Overnight Events: No overnight events   Interim History: Patient is very distraught this morning. She keeps saying that something is wrong and is intermittently laying down in bed and then sitting up in bed. She is very tearful. Review of systems are all negative, patient just continues to report that something is wrong. Spent time trying to comfort patient. She did drink a lot of water  and when food was brought did start eating the food. No other complaints at this time.   OBJECTIVE:  Vital Signs: Vitals:   07/15/24 0415 07/15/24 0820 07/15/24 1042 07/15/24 1045  BP: (!) 144/60 (!) 143/53  (!) 135/46  Pulse: 70 84  76  Resp: 17 20 20 20   Temp: 97.9 F (36.6 C) 98.5 F (36.9 C)  98.9 F (37.2 C)  TempSrc: Axillary Oral Oral Oral  SpO2: 98% 97% 96% 92%  Weight:      Height:       Supplemental O2: Room Air SpO2: 92 %  Filed Weights   07/14/24 1458  Weight: 99 kg    No intake or output data in the 24 hours ending 07/15/24 1438 Net IO Since Admission: No IO data has been entered for this period [07/15/24 1438]  Physical Exam: Physical Exam Constitutional:      Comments: Patient is very upset, tearful and could not be consoled. Going back and forth between sitting and laying down in bed.   Cardiovascular:     Rate and Rhythm: Normal rate and regular rhythm.     Heart sounds: Normal heart sounds.  Pulmonary:     Effort: Pulmonary effort is normal.     Breath sounds: Normal breath sounds.  Musculoskeletal:     Comments: Bilateral BKA. No signs of open wounds or drainage. No erythema noted   Skin:    Comments: Decreased skin turgor   Neurological:     Mental Status: She is oriented to person, place, and time.     Patient Lines/Drains/Airways Status     Active Line/Drains/Airways     Name  Placement date Placement time Site Days   Peripheral IV 07/14/24 20 G Left Antecubital 07/14/24  1655  Antecubital  1   Wound / Incision (Open or Dehisced) 11/07/20 (MASD) Moisture Associated Skin Damage Buttocks Right;Left;Bilateral MASD redness bilateral buttocks 11/07/20  1800  Buttocks  1346   Wound / Incision (Open or Dehisced) 03/25/24 Venous stasis ulcer Leg Right Full thickness R lower leg 03/25/24  0800  Leg  112   Wound / Incision (Open or Dehisced) 03/25/24 Other (Comment) Right R foot necrotic tissue 03/25/24  0800  --  112            Pertinent labs and imaging:      Latest Ref Rng & Units 07/15/2024    3:29 AM 07/14/2024    4:00 PM 04/15/2024    3:40 AM  CBC  WBC 4.0 - 10.5 K/uL 8.6  8.5  9.0   Hemoglobin 12.0 - 15.0 g/dL 89.5  88.8  7.5   Hematocrit 36.0 - 46.0 % 33.8  36.4  24.1   Platelets 150 - 400 K/uL 180  181  331        Latest Ref Rng & Units 07/15/2024    3:29 AM 07/14/2024    4:00 PM 04/13/2024  6:45 AM  CMP  Glucose 70 - 99 mg/dL 864  804    BUN 8 - 23 mg/dL 6  8    Creatinine 9.55 - 1.00 mg/dL 9.07  9.04  9.15   Sodium 135 - 145 mmol/L 141  139    Potassium 3.5 - 5.1 mmol/L 2.9  3.2    Chloride 98 - 111 mmol/L 107  104    CO2 22 - 32 mmol/L 24  22    Calcium  8.9 - 10.3 mg/dL 8.8  9.1    Total Protein 6.5 - 8.1 g/dL  6.9    Total Bilirubin 0.0 - 1.2 mg/dL  0.7    Alkaline Phos 38 - 126 U/L  72    AST 15 - 41 U/L  21    ALT 0 - 44 U/L  14      CT Head Wo Contrast Result Date: 07/14/2024 CLINICAL DATA:  Provided history: Mental status change, unknown cause EXAM: CT HEAD WITHOUT CONTRAST TECHNIQUE: Contiguous axial images were obtained from the base of the skull through the vertex without intravenous contrast. RADIATION DOSE REDUCTION: This exam was performed according to the departmental dose-optimization program which includes automated exposure control, adjustment of the mA and/or kV according to patient size and/or use of iterative reconstruction  technique. COMPARISON:  10/31/2023 FINDINGS: Brain: No intracranial hemorrhage, mass effect, or midline shift. Stable brain volume. No hydrocephalus. The basilar cisterns are patent. Stable periventricular white matter hypodensity. No evidence of territorial infarct or acute ischemia. No extra-axial or intracranial fluid collection. Vascular: Atherosclerosis of skullbase vasculature without hyperdense vessel or abnormal calcification. Skull: Paranasal sinuses and mastoid air cells are clear. The visualized orbits are unremarkable. Sinuses/Orbits: Paranasal sinuses and mastoid air cells are clear. The visualized orbits are unremarkable. Other: None. IMPRESSION: 1. No acute intracranial abnormality. 2. Stable periventricular white matter hypodensity. Electronically Signed   By: Andrea Gasman M.D.   On: 07/14/2024 18:35   CT ABDOMEN PELVIS WO CONTRAST Result Date: 07/14/2024 CLINICAL DATA:  Acute abdominal pain for several days. EXAM: CT ABDOMEN AND PELVIS WITHOUT CONTRAST TECHNIQUE: Multidetector CT imaging of the abdomen and pelvis was performed following the standard protocol without IV contrast. RADIATION DOSE REDUCTION: This exam was performed according to the departmental dose-optimization program which includes automated exposure control, adjustment of the mA and/or kV according to patient size and/or use of iterative reconstruction technique. COMPARISON:  03/15/2024 FINDINGS: Lower chest: No acute findings. Hepatobiliary: No mass visualized on this unenhanced exam. Gallbladder is unremarkable. No evidence of biliary ductal dilatation. Pancreas: No mass or inflammatory process visualized on this unenhanced exam. Spleen:  Within normal limits in size. Adrenals/Urinary tract: No evidence of urolithiasis or hydronephrosis. A lesion with heterogeneous attenuation is seen in the midpole the right kidney which measures 5 cm. This appears increased in size from 3 cm in 2023. This cannot be characterized on this  unenhanced exam, and could represent a proteinaceous cyst or solid renal mass. Unremarkable unopacified urinary bladder. Stomach/Bowel: No evidence of obstruction, inflammatory process, or abnormal fluid collections. Normal appendix visualized. Mild colonic diverticulosis again noted, without signs of diverticulitis. Vascular/Lymphatic: No pathologically enlarged lymph nodes identified. No evidence of abdominal aortic aneurysm. Reproductive: Prior hysterectomy noted. Adnexal regions are unremarkable in appearance. Other:  None. Musculoskeletal:  No suspicious bone lesions identified. IMPRESSION: No evidence of urolithiasis, hydronephrosis, or other acute findings. Increased size of 5 cm heterogeneous lesion in midpole of right kidney, which cannot be characterized on this unenhanced exam. This  could represent a proteinaceous cyst or solid renal mass. Abdomen MRI without and with contrast is recommended for further characterization. Colonic diverticulosis, without radiographic evidence of diverticulitis. Electronically Signed   By: Norleen DELENA Kil M.D.   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 person living with a history of COPD, HLD, DM who presented with confusion and admitted for encephalopathy.  Plan: #  Encephalopathy  Patient is A &O x 3 but is very distraught, tearful. She denies any ROS, but keeps saying that she does not know what  is happening to her. Patient did have decreased skin turgor and did drink water  fast when given to her and started eating fruit cup. Sounds like since leaving nursing facility, over the last month patient has had mood changes and recently had decreased PO intake. Patient does have a history of chronic UTIs and UA was positive for bacteria, leukocytes and no nitrites. Patient denied any urinary symptoms but given altered mental state, will consider antibiotics tomorrow to see if this will improve mentation. One to one sitter for safety observation - continue gentle IV fluids for rehydration - holding centrally acting medications - correct electrolyte abnormalities although we do not feel that this is the cause of current state.  -MRI ordered. Patient will not likely tolerate MRI until mentation improves. Will try later today or tomorrow.  - Will continue bladder scans to see if patient is retaining urine. Will also consider antibiotics tomorrow.  - monitor mentation. Was able to call son, but he was not able to provide any answers to why patient's mental state has changed.   #Hypokalemia  #Hypomagnesemia  - Potassium went down to 3.2 this morning. Could be in the setting of poor PO intake and also low magnesium  - repleted magnesium  and potassium - Will follow these electrolytes on BMP   #Major depression in partial remission (HCC) Home duloxetine , might worsen patient's mentation as it has effects on CNS.  Patient's mental state could be playing apart in her presentation. Son reports at home that patient was staring off into space, yelling at people, and for the last couple of days was refusing to eat and drink. Will continue to monitor  --Hold home duloxetine  60 mg   #Hypertension  Blood pressure has been stable in the 130s-140s  - continuing home amlodipine  10 mg, spironolactone  50 mg daily   # Polypharmacy  - Patient is on many medications and those medications could have changed when  leaving nursing facility. Will need medication reconciliation during this hospitalization. Multiple medication could also be affecting her mental state. Will work on this throughout hospitalization.    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  Resume once mentation improves. Denying any new or recent pain  --Hold home pregabalin  200 milligrams 3 times daily -patient is off of her regular oxycodone  now, so watch for  signs of withdrawal.  #Urinary Incontinence- Patient is prescribed Myrbetriq  and Oxybutynin . Will hold these medications at this time.  - Bladder scan showed 196 mL. Will continue Q4 bladder scan. Can in and out cath if over 250 ml  - Will look for signs of infection, leukocytosis and fever, in the setting of chronic UTI  #Decreased TSH  - mildly decreased TSH, free T4 mildly elevated. Less suspicious that this is contributing to current presentation  Stable Chronic Conditions:  GERD -continue pantoprazole  40 mg  CKD stage 2 due to Type 2 DM-monitor BUN and GFR  Type 2 Diabetes Mellitus- Novolog  SSI. Fasting glucose at goal. Patient was reportedly not eating before, so continue to monitor sugars if patient starts eating and adjust.  Hyperlipidemia associated with type 2 diabetes mellitus  Peripheral arterial disease with history of revascularization - continue home rosuvastatin  20 mg COPD- continue home anora ellipta  Gastroparesis, likely secondary to diabetes - holding home Reglan  10 mg  Renal lesion - 5 cm mass lesion in the midpole of right kidney . Follow up with outpatient MRI   Best Practice: Diet: Regular diet IVF: Fluids: gentle fluids  VTE: enoxaparin  (LOVENOX ) injection 40 mg Start: 07/14/24 2030 Code: Full  Disposition planning: Therapy Recs: pending recommendations  Family Contact: Son notified  DISPO: Anticipated discharge pending clinical improvement.   Signature:  Nakayla Rorabaugh  [facility]  2:38 PM, 07/15/2024  On Call pager [phone]

## 2024-07-15 NOTE — Care Management Obs Status (Signed)
 MEDICARE OBSERVATION STATUS NOTIFICATION   Patient Details  Name: [name] MRN: [id number] Date of Birth: 1961/07/31   Medicare Observation Status Notification Given:  Yes  Spoke with Paulett Mall to advised  of Ob letter and will mail to the patient home address [place] Certified mail.  Sharlyn Odonnel 07/15/2024, 4:36 PM

## 2024-07-16 ENCOUNTER — Inpatient Hospital Stay (HOSPITAL_COMMUNITY)

## 2024-07-16 LAB — BASIC METABOLIC PANEL WITH GFR
Anion gap: 12 (ref 5–15)
BUN: 5 mg/dL — ABNORMAL LOW (ref 8–23)
CO2: 22 mmol/L (ref 22–32)
Calcium: 8.7 mg/dL — ABNORMAL LOW (ref 8.9–10.3)
Chloride: 104 mmol/L (ref 98–111)
Creatinine, Ser: 0.72 mg/dL (ref 0.44–1.00)
GFR, Estimated: >60 mL/min (ref >60)
Glucose, Bld: 193 mg/dL — ABNORMAL HIGH (ref 70–99)
Potassium: 3.8 mmol/L (ref 3.5–5.1)
Sodium: 138 mmol/L (ref 135–145)

## 2024-07-16 LAB — CBC
HCT: 36.3 % (ref 36.0–46.0)
Hemoglobin: 11.3 g/dL — ABNORMAL LOW (ref 12.0–15.0)
MCH: 25.9 pg — ABNORMAL LOW (ref 26.0–34.0)
MCHC: 31.1 g/dL (ref 30.0–36.0)
MCV: 83.3 fL (ref 80.0–100.0)
Platelets: 210 K/uL (ref 150–400)
RBC: 4.36 MIL/uL (ref 3.87–5.11)
RDW: 19.2 % — ABNORMAL HIGH (ref 11.5–15.5)
WBC: 5.6 K/uL (ref 4.0–10.5)
nRBC: 0 % (ref 0.0–0.2)

## 2024-07-16 LAB — URINE CULTURE: Culture: >=100000 — AB

## 2024-07-16 LAB — GLUCOSE, CAPILLARY
Glucose-Capillary: 247 mg/dL — ABNORMAL HIGH (ref 70–99)
Glucose-Capillary: 339 mg/dL — ABNORMAL HIGH (ref 70–99)
Glucose-Capillary: 170 mg/dL — ABNORMAL HIGH (ref 70–99)

## 2024-07-16 LAB — MAGNESIUM: Magnesium: 1.9 mg/dL (ref 1.7–2.4)

## 2024-07-16 MED ORDER — INSULIN GLARGINE-YFGN 100 UNIT/ML ~~LOC~~ SOLN
15.0000 [IU] | Freq: Every day | SUBCUTANEOUS | Status: DC
  Administered 2024-07-16 – 2024-07-17 (×2): 15 [IU] via SUBCUTANEOUS
  Filled 2024-07-16 (×2): qty 0.15

## 2024-07-16 MED ORDER — SODIUM CHLORIDE 0.9 % IV SOLN
1.0000 g | INTRAVENOUS | Status: AC
  Administered 2024-07-16 – 2024-07-18 (×3): 1 g via INTRAVENOUS
  Filled 2024-07-16 (×3): qty 10

## 2024-07-16 NOTE — Inpatient Diabetes Management (Signed)
 Inpatient Diabetes Program Recommendations  AACE/ADA: New Consensus Statement on Inpatient Glycemic Control (2015)  Target Ranges:  Prepandial:   less than 140 mg/dL      Peak postprandial:   less than 180 mg/dL (1-2 hours)      Critically ill patients:  140 - 180 mg/dL   Lab Results  Component Value Date   GLUCAP 247 (H) 07/16/2024   HGBA1C 9.8 (H) 04/06/2024    Review of Glycemic Control  Latest Reference Range & Units 07/15/24 11:08 07/15/24 15:47 07/15/24 20:41 07/16/24 09:49  Glucose-Capillary 70 - 99 mg/dL 819 (H) 828 (H) 857 (H) 247 (H)  (H): Data is abnormally high  Diabetes history: DM2 Outpatient Diabetes medications: Tresiba  65 units every day, Ozempic  1 mg weekly, Dexcom G7 Current orders for Inpatient glycemic control: Novolog  0-15 units TID an d0-5 units QHS  Inpatient Diabetes Program Recommendations:    Please consider:  Semglee  15 units every day  Referral received for son DM education.  Called and spoke with son Paulett this am, [phone].  He does not know what his mother takes for her DM.  He only knows she takes 1 shot a day and wears a sensor on her arm.  He does not think she takes a once weekly injection (Ozempic ).  He states his mom administers her medications independently.  She drinks regular sodas and does not follow a DM diet.  I explained that she is supposed to take Tresiba , which is a long acting insulin  once daily-65 units.  She is also prescribed Ozempic  1 mg weekly.  I asked him to become familiar with her medications as she lives with him and she may need assistance from time to time.  Asked if she ever has any low blood sugars and he states she does and she treats with soda.  Asked him to encourage her to avoid sugary beverages and watch her CHO intake.  He states, I can't force her.  I repeated to just try to encourage her.    Educated on The Plate Method, CHO's, portion control, avoiding caloric beverages, CBGs at home fasting and mid  afternoon, F/U with PCP every 3 months, bring meter to PCP office, long and short term complications of uncontrolled BG, and importance of exercise.  I spoke with Ms. Remmel back in April regarding her DM management and A1C of 9.8%.  Thank you, Wyvonna Pinal, MSN, CDC[facility] Diabetes Coordinator Inpatient Diabetes Program [phone] (team pager from 8a-5p)

## 2024-07-16 NOTE — Progress Notes (Signed)
 Pt refused to go to MRI. Pt educated by RN but declined to transport to have images completed at this time. Elicia, MD made aware

## 2024-07-16 NOTE — Plan of Care (Signed)
  Problem: Education: Goal: Ability to describe self-care measures that may prevent or decrease complications (Diabetes Survival Skills Education) will improve Outcome: Progressing Goal: Individualized Educational Video(s) Outcome: Progressing   Problem: Nutritional: Goal: Maintenance of adequate nutrition will improve Outcome: Progressing Goal: Progress toward achieving an optimal weight will improve Outcome: Progressing   Problem: Skin Integrity: Goal: Risk for impaired skin integrity will decrease Outcome: Progressing   Problem: Activity: Goal: Risk for activity intolerance will decrease Outcome: Progressing

## 2024-07-16 NOTE — Progress Notes (Signed)
 Pt refused labs, will attempt later in am

## 2024-07-16 NOTE — Progress Notes (Addendum)
 HD#1 SUBJECTIVE:  Patient Summary: Jennifer [name] is a 63 y.o. person living with a history of COPD, HLD, DM who presented with confusion and admitted for encephalopathy.   Overnight Events: No overnight events   Interim History: Patient was resting on arrival then sat up and was tearful. States slept last night. Endorses headache this morning. Denies acute or new changes compared to prior headaches. No vision changes. No n/v. Denies pain elsewhere including back, chest, abdominal, legs. Denies dysuria still.   OBJECTIVE:  Vital Signs: Vitals:   07/15/24 1605 07/15/24 1823 07/15/24 2039 07/16/24 0548  BP: (!) 148/69 (!) 142/62 (!) 155/55 (!) 147/56  Pulse: 70 72 65 71  Resp: 18 18 16 18   Temp: 99.2 F (37.3 C) 98.3 F (36.8 C) 98 F (36.7 C) 98.2 F (36.8 C)  TempSrc: Oral Oral Oral Oral  SpO2: 100% 97% 99% 99%  Weight:      Height:       Supplemental O2: Room Air SpO2: 99 %  Filed Weights   07/14/24 1458  Weight: 99 kg     Intake/Output Summary (Last 24 hours) at 07/16/2024 0631 Last data filed at 07/16/2024 0548 Gross per 24 hour  Intake 833.79 ml  Output 0 ml  Net 833.79 ml   Net IO Since Admission: 833.79 mL [07/16/24 0631]  Physical Exam:  Physical Exam Constitutional:      General: She is not in acute distress.    Comments: Patient is tearful but unable to elaborate reason.   Cardiovascular:     Rate and Rhythm: Normal rate and regular rhythm.  Pulmonary:     Effort: Pulmonary effort is normal. No respiratory distress.     Breath sounds: Normal breath sounds.  Abdominal:     General: Abdomen is flat.     Palpations: Abdomen is soft.  Musculoskeletal:     Comments: Bilateral BKA. No signs of open wounds or drainage. No erythema or TTP noted.  Skin:    General: Skin is warm and dry.     Comments: Decreased skin turgor.   Neurological:     Mental Status: She is alert and oriented to person, place, and time.     Comments: Moves all extremities on  command. Intact sensation.   Psychiatric:        Attention and Perception: She is inattentive.        Mood and Affect: Affect is tearful.     Patient Lines/Drains/Airways Status     Active Line/Drains/Airways     Name Placement date Placement time Site Days   Peripheral IV 07/14/24 20 G Left Antecubital 07/14/24  1655  Antecubital  1   Wound / Incision (Open or Dehisced) 11/07/20 (MASD) Moisture Associated Skin Damage Buttocks Right;Left;Bilateral MASD redness bilateral buttocks 11/07/20  1800  Buttocks  1346   Wound / Incision (Open or Dehisced) 03/25/24 Venous stasis ulcer Leg Right Full thickness R lower leg 03/25/24  0800  Leg  112   Wound / Incision (Open or Dehisced) 03/25/24 Other (Comment) Right R foot necrotic tissue 03/25/24  0800  --  112            Pertinent labs and imaging:      Latest Ref Rng & Units 07/16/2024    8:26 AM 07/15/2024    3:29 AM 07/14/2024    4:00 PM  CBC  WBC 4.0 - 10.5 K/uL 5.6  8.6  8.5   Hemoglobin 12.0 - 15.0 g/dL 88.6  89.5  11.1   Hematocrit 36.0 - 46.0 % 36.3  33.8  36.4   Platelets 150 - 400 K/uL 210  180  181        Latest Ref Rng & Units 07/16/2024    8:26 AM 07/15/2024    3:29 AM 07/14/2024    4:00 PM  CMP  Glucose 70 - 99 mg/dL 806  864  804   BUN 8 - 23 mg/dL 5  6  8    Creatinine 0.44 - 1.00 mg/dL 9.27  9.07  9.04   Sodium 135 - 145 mmol/L 138  141  139   Potassium 3.5 - 5.1 mmol/L 3.8  2.9  3.2   Chloride 98 - 111 mmol/L 104  107  104   CO2 22 - 32 mmol/L 22  24  22    Calcium  8.9 - 10.3 mg/dL 8.7  8.8  9.1   Total Protein 6.5 - 8.1 g/dL   6.9   Total Bilirubin 0.0 - 1.2 mg/dL   0.7   Alkaline Phos 38 - 126 U/L   72   AST 15 - 41 U/L   21   ALT 0 - 44 U/L   14     No results found.   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 person living with a history of COPD, HLD, DM who presented with confusion and admitted for encephalopathy.  Plan: #Encephalopathy, unclear etiology  Remains A&Ox3 but distraught and tearful, altered from her baseline. Remains inattentive. Only report this morning was a frontal headache which is not new headache per patient. Pending brain MRI hopeful to get today. Pending AM labs since she declined them earlier but agreed now. Received IVF yesterday and tolerating po intake. Afebrile and no leukocytosis yesterday. Will initiate treatment empirically for UTI given hx of recurrent UTI.  - Continue 1:1 sitter observation  - Encourage po fluid intake - Holding centrally acting medications at this time  - F/u AM BMP and CBC - Pending MRI brain  - Collect blood cultures today, will start CTX for UTI coverage after cultures collected - Monitor mentation. Was able to call son, but he was not able to provide any answers to why patient's mental state has changed.   #Hypokalemia  #Hypomagnesemia  Received repletion yesterday. Pending AM labs today.  - Monitor BMP and mag and replete as needed  #Major depression in partial remission (HCC) Home duloxetine , might worsen patient's mentation as it has effects on CNS.  Patient's mental state could be playing apart in her presentation. Son reports at home that patient was staring off into space, yelling at people, and for the last couple of days was refusing to eat and drink. Will continue to monitor  - Hold home duloxetine  60 mg   #Hypertension  BP mildly hypertensive.  - Continue home amlodipine  10 mg,  spironolactone  50 mg daily   # Polypharmacy  - Patient is on many medications and those  medications could have changed when leaving nursing facility. Will need medication reconciliation during this hospitalization. Multiple medication could also be affecting her mental state. Will work on this throughout hospitalization.    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  Resume once mentation improves. Only pain today is headache.  - Hold home pregabalin  200 milligrams 3 times daily - patient is off of her home oxycodone  now, so watch for signs of withdrawal.  #Urinary Incontinence Patient is prescribed Myrbetriq  and Oxybutynin . Will hold these medications at this time.  - Continue Q4H bladder scan. Can in and out cath if over 250 ml  - Start UTI treatment see above today   #Decreased TSH  - Mildly decreased TSH, free T4 mildly elevated. Less suspicious that this is contributing to current presentation.   Stable Chronic Conditions:  GERD- continue home pantoprazole  40 mg  CKD stage 2 due to Type 2 DM- stable/at baseline, monitor BMP for renal function  Type 2 Diabetes Mellitus- Novolog  SSI-M ACHS. Fasting 200s this AM so will start reduced semglee  today.  Hyperlipidemia associated with T2DM PAD with history of revascularization- continue home rosuvastatin  20 mg COPD- lung exam stable, continue home anora ellipta  Gastroparesis, likely secondary to diabetes- holding home Reglan  10 mg at this time R Renal lesion - 5 cm mass lesion in the midpole of right kidney. Follow up with outpatient MRI   Best Practice: Diet: Regular diet IVF: Fluids: none VTE: enoxaparin  (LOVENOX ) injection 40 mg Start: 07/14/24 2030 Code: Full  Disposition planning: Therapy Recs: pending recommendations  Family Contact: Son notified 7/25 DISPO: Anticipated discharge pending clinical improvement.   Signature: Ozell Elicia [facility]  [facility]  6:31 AM, 07/16/2024  On Call pager [phone]

## 2024-07-16 NOTE — Evaluation (Signed)
 Occupational Therapy Evaluation Patient Details Name: Jennifer [name] MRN: [id number] DOB: [birth year] Today's Date: 07/16/2024   History of Present Illness   Pt is a 63 y/o F admitted on 07/14/24 after presenting with c/o AMS. Pt is being treated for encephalopathy. 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 Pt stating I don't know what's wrong with me. Pt then asking for her son, this therapist assisted pt to call her son using the room phone, but pt's son did not answer. Provided pt with written number and phone in reach so she can call her son at another time. Pt appreciative. Pt decliner further mobility/engagement in ADL this session. Pt states PTA, her son was assisting her with transfers, did not elaborate how and did not provide further information on level of assistance.  OT will continue to follow and progress appropriately. NT arrived to room at end of session to provide comfort to the pt.        If plan is discharge home, recommend the following:   A lot of help with walking and/or transfers;A little help with bathing/dressing/bathroom     Functional Status Assessment   Patient has had a recent decline in their functional status and demonstrates the ability to make significant improvements in function in a reasonable and predictable amount of time.     Equipment Recommendations   Other (comment) (tbd)     Recommendations for Other Services         Precautions/Restrictions   Precautions Precautions: Fall Precaution/Restrictions Comments: BLE amputations Restrictions Weight Bearing Restrictions Per Provider Order: No     Mobility Bed Mobility               General bed mobility comments: pt long sitting  in bed, declined return to supine/fowlers    Transfers                   General transfer comment: Pt able to long sit in bed, turn self around declined any further mobility this date      Balance Overall balance assessment: Needs assistance Sitting-balance support: Bilateral upper extremity supported Sitting balance-Leahy Scale: Good Sitting balance - Comments: able to long sit in bed without LOB                                   ADL either performed or assessed with clinical judgement   ADL Overall ADL's : Needs assistance/impaired                                       General ADL Comments: setup assistance for UB ADL and grooming, pt declined active participation in grooming/dressing tasks this date, use of clinical judgement anticipate she will require setup assistance. antipate she would require contact guard assistance for LB ADL     Vision         Perception         Praxis         Pertinent Vitals/Pain Pain Assessment Pain Assessment: Faces Faces Pain Scale: No hurt Pain Intervention(s): Monitored during session  Extremity/Trunk Assessment Upper Extremity Assessment Upper Extremity Assessment: Generalized weakness   Lower Extremity Assessment Lower Extremity Assessment: Generalized weakness (BLE amputation)       Communication Communication Communication: No apparent difficulties Factors Affecting Communication: Difficulty expressing self   Cognition Arousal: Lethargic Behavior During Therapy: Lability               OT - Cognition Comments: pt crying upon arrival, stating please help me but unable to state what she needs help with. pt stating I don't know what's wrong with me and requesting for her son, assisted pt with calling her son on the house phone, son did not answer. left pt with written number and phone in reach so she can call him at her leisure. Pt oriented to self and place.                  Following commands: Impaired Following commands impaired: Follows one step commands with increased time     Cueing  General Comments   Cueing Techniques: Verbal cues;Gestural cues;Tactile cues;Visual cues  NT arrived at end of session to comfort pt   Exercises     Shoulder Instructions      Home Living Family/patient expects to be discharged to:: Private residence Living Arrangements: Children Available Help at Discharge: Family;Available 24 hours/day Type of Home: House Home Access: Ramped entrance     Home Layout: One level     Bathroom Shower/Tub: Chief Strategy Officer: Standard     Home Equipment: Rollator (4 wheels);Tub bench;Wheelchair - power   Additional Comments: per chart, pt's son lives at home with her and provides some assistance.      Prior Functioning/Environment Prior Level of Function : Patient poor historian/Family not available             Mobility Comments: Pt reports she does get OOB to a w/c but unable to state how she transfers. reports her son assists at times ADLs Comments: per chart son assists with meal prep, pt does her own bathing, dressing and toileting    OT Problem List: Impaired balance (sitting and/or standing)   OT Treatment/Interventions: Therapeutic exercise;Self-care/ADL training;DME and/or AE instruction;Therapeutic activities;Patient/family education;Balance training      OT Goals(Current goals can be found in the care plan section)   Acute Rehab OT Goals Patient Stated Goal: to talk with her son OT Goal Formulation: With patient Time For Goal Achievement: 07/30/24 Potential to Achieve Goals: Good ADL Goals Pt Will Perform Lower Body Dressing: with set-up;sitting/lateral leans Pt Will Transfer to Toilet: with min assist;with transfer board Additional ADL Goal #1: Pt will transfer to/from w/c with minA in prepartion for OOB ADL.   OT Frequency:  Min 2X/week    Co-evaluation               AM-PAC OT 6 Clicks Daily Activity     Outcome Measure Help from another person eating meals?: None Help from another person taking care of personal grooming?: A Little Help from another person toileting, which includes using toliet, bedpan, or urinal?: A Lot Help from another person bathing (including washing, rinsing, drying)?: A Lot Help from another person to put on and taking off regular upper body clothing?: A Little Help from another person to put on and taking off regular lower body clothing?: A Little 6 Click Score: 17   End of Session Nurse Communication: Mobility status  Activity Tolerance: Other (comment) (pt limited by lability) Patient left: in bed;with nursing/sitter in  room;with bed alarm set  OT Visit Diagnosis: Other abnormalities of gait and mobility (R26.89)                Time: 9092-9083 OT Time Calculation (min): 9 min Charges:  OT General Charges $OT Visit: 1 Visit OT Evaluation $OT Eval Low Complexity: 1 Low  Marlayna Bannister OTR/L Acute Rehabilitation Services Office: [phone]   Verneita ONEIDA Moose 07/16/2024, 11:32 AM

## 2024-07-17 ENCOUNTER — Inpatient Hospital Stay (HOSPITAL_COMMUNITY)

## 2024-07-17 DIAGNOSIS — G9340 Encephalopathy, unspecified: Secondary | ICD-10-CM

## 2024-07-17 DIAGNOSIS — N390 Urinary tract infection, site not specified: Secondary | ICD-10-CM

## 2024-07-17 DIAGNOSIS — I10 Essential (primary) hypertension: Secondary | ICD-10-CM

## 2024-07-17 DIAGNOSIS — I6340 Cerebral infarction due to embolism of unspecified cerebral artery: Secondary | ICD-10-CM | POA: Diagnosis not present

## 2024-07-17 DIAGNOSIS — R32 Unspecified urinary incontinence: Secondary | ICD-10-CM

## 2024-07-17 DIAGNOSIS — E119 Type 2 diabetes mellitus without complications: Secondary | ICD-10-CM

## 2024-07-17 DIAGNOSIS — R29702 NIHSS score 2: Secondary | ICD-10-CM

## 2024-07-17 LAB — GLUCOSE, CAPILLARY
Glucose-Capillary: 267 mg/dL — ABNORMAL HIGH (ref 70–99)
Glucose-Capillary: 147 mg/dL — ABNORMAL HIGH (ref 70–99)
Glucose-Capillary: 229 mg/dL — ABNORMAL HIGH (ref 70–99)
Glucose-Capillary: 212 mg/dL — ABNORMAL HIGH (ref 70–99)

## 2024-07-17 LAB — BASIC METABOLIC PANEL WITH GFR
Anion gap: 11 (ref 5–15)
BUN: <5 mg/dL — ABNORMAL LOW (ref 8–23)
CO2: 20 mmol/L — ABNORMAL LOW (ref 22–32)
Calcium: 9 mg/dL (ref 8.9–10.3)
Chloride: 105 mmol/L (ref 98–111)
Creatinine, Ser: 0.99 mg/dL (ref 0.44–1.00)
GFR, Estimated: >60 mL/min (ref >60)
Glucose, Bld: 252 mg/dL — ABNORMAL HIGH (ref 70–99)
Potassium: 4.5 mmol/L (ref 3.5–5.1)
Sodium: 136 mmol/L (ref 135–145)

## 2024-07-17 LAB — CBC
HCT: 36.7 % (ref 36.0–46.0)
Hemoglobin: 11.3 g/dL — ABNORMAL LOW (ref 12.0–15.0)
MCH: 26 pg (ref 26.0–34.0)
MCHC: 30.8 g/dL (ref 30.0–36.0)
MCV: 84.4 fL (ref 80.0–100.0)
Platelets: 197 K/uL (ref 150–400)
RBC: 4.35 MIL/uL (ref 3.87–5.11)
RDW: 19.3 % — ABNORMAL HIGH (ref 11.5–15.5)
WBC: 6.9 K/uL (ref 4.0–10.5)
nRBC: 0 % (ref 0.0–0.2)

## 2024-07-17 LAB — MAGNESIUM: Magnesium: 1.6 mg/dL — ABNORMAL LOW (ref 1.7–2.4)

## 2024-07-17 MED ORDER — CLOPIDOGREL BISULFATE 75 MG PO TABS
75.0000 mg | ORAL_TABLET | Freq: Every day | ORAL | Status: DC
  Administered 2024-07-18 – 2024-07-20 (×3): 75 mg via ORAL
  Filled 2024-07-17 (×3): qty 1

## 2024-07-17 MED ORDER — PREGABALIN 25 MG PO CAPS
25.0000 mg | ORAL_CAPSULE | Freq: Three times a day (TID) | ORAL | Status: DC
  Administered 2024-07-17 – 2024-07-20 (×10): 25 mg via ORAL
  Filled 2024-07-17 (×10): qty 1

## 2024-07-17 MED ORDER — CLOPIDOGREL BISULFATE 75 MG PO TABS
300.0000 mg | ORAL_TABLET | Freq: Once | ORAL | Status: AC
  Administered 2024-07-17: 300 mg via ORAL
  Filled 2024-07-17: qty 4

## 2024-07-17 MED ORDER — INSULIN GLARGINE-YFGN 100 UNIT/ML ~~LOC~~ SOLN
20.0000 [IU] | Freq: Every day | SUBCUTANEOUS | Status: DC
  Administered 2024-07-18 – 2024-07-19 (×2): 20 [IU] via SUBCUTANEOUS
  Filled 2024-07-17 (×2): qty 0.2

## 2024-07-17 MED ORDER — STROKE: EARLY STAGES OF RECOVERY BOOK
Freq: Once | Status: DC
  Filled 2024-07-17: qty 1

## 2024-07-17 MED ORDER — ASPIRIN 81 MG PO TBEC
81.0000 mg | DELAYED_RELEASE_TABLET | Freq: Every day | ORAL | Status: DC
  Administered 2024-07-17 – 2024-07-20 (×4): 81 mg via ORAL
  Filled 2024-07-17 (×4): qty 1

## 2024-07-17 MED ORDER — LORAZEPAM 2 MG/ML IJ SOLN
0.5000 mg | Freq: Once | INTRAMUSCULAR | Status: AC
  Administered 2024-07-17: 0.5 mg via INTRAVENOUS
  Filled 2024-07-17: qty 1

## 2024-07-17 MED ORDER — POLYETHYLENE GLYCOL 3350 17 G PO PACK
17.0000 g | PACK | Freq: Every day | ORAL | Status: DC
  Filled 2024-07-17: qty 1

## 2024-07-17 MED ORDER — DULOXETINE HCL 60 MG PO CPEP
60.0000 mg | ORAL_CAPSULE | Freq: Every day | ORAL | Status: DC
  Administered 2024-07-17 – 2024-07-20 (×4): 60 mg via ORAL
  Filled 2024-07-17 (×4): qty 1

## 2024-07-17 MED ORDER — MAGNESIUM SULFATE 2 GM/50ML IV SOLN
2.0000 g | Freq: Once | INTRAVENOUS | Status: AC
  Administered 2024-07-17: 2 g via INTRAVENOUS
  Filled 2024-07-17: qty 50

## 2024-07-17 NOTE — Plan of Care (Signed)

## 2024-07-17 NOTE — Progress Notes (Signed)
 Received page from radiology about brain MRI results, which show an acute posterior left MCA territory infarct predominantly involving the posterior insula and overlying left parietal lobe. On my exam, she doesn't appear to be having any focal deficits, with CN II-XII intact, as well as 4/5 strength in her upper extremities (has bilateral BKAs). She is alert and orientated x3.    Will discuss with neurology proper next steps, this finding may have been the etiology of her symptoms. Her stroke appears to be mostly ischemic from her significant PAD, but will follow up neurology recommendations.

## 2024-07-17 NOTE — Consult Note (Signed)
 NEUROLOGY CONSULT NOTE   Date of service: July 17, 2024 Patient Name: Jennifer [name] MRN:  [id number] DOB:  11-04-1961 Chief Complaint: confusion Requesting Provider: Karna Fellows, MD  History of Present Illness  Jennifer [name] is a 63 y.o. female with hx of CHF, CKD, diabetes, hypertension, hyperlipidemia, bilateral lower extremity amputations who presents with confusion that has been going on for at least 4 days.  Per the initial H&P, it been going on for 2 to 3 days on arrival.  She apparently would state I do not know or I need help.  The patient reports that she still feels just as confused as when she first got here, but her speech is now fluent and she is able to verbally communicate quite well.  LKW: A week ago IV Thrombolysis: No, outside window EVT: No, outside window  NIHSS components Score: Comment  1a Level of Conscious 0[x]  1[]  2[]  3[]      1b LOC Questions 0[x]  1[]  2[]       1c LOC Commands 0[x]  1[]  2[]       2 Best Gaze 0[]  1[x]  2[]       3 Visual 0[x]  1[]  2[]  3[]      4 Facial Palsy 0[x]  1[]  2[]  3[]      5a Motor Arm - left 0[x]  1[]  2[]  3[]  4[]  UN[]    5b Motor Arm - Right 0[x]  1[]  2[]  3[]  4[]  UN[]    6a Motor Leg - Left 0[x]  1[]  2[]  3[]  4[]  UN[]    6b Motor Leg - Right 0[x]  1[]  2[]  3[]  4[]  UN[]    7 Limb Ataxia 0[x]  1[]  2[]  UN[]      8 Sensory 0[x]  1[]  2[]  UN[]      9 Best Language 0[x]  1[]  2[]  3[]      10 Dysarthria 0[]  1[x]  2[]  UN[]      11 Extinct. and Inattention 0[x]  1[]  2[]       TOTAL: 2      Past History   Past Medical History:  Diagnosis Date   Acute vestibular syndrome, resolved 03/02/2021   Angioedema 06/14/2021   Asthma    Burn of finger of right hand, second degree 02/05/2021   Occurred during cooking (frying), poor sensation due to neuropathy, pt punctured blister to allow it to drain, skin has since desquamated over dorsal joint, no infection.  Keep clean and dry, OTC antibacterial ointment.   Cataract    CHF (congestive heart failure) (HCC)     Chronic bronchitis (HCC)    I get it alot (09/28/2013)   Chronic diastolic heart failure (HCC)    grade 2 per 2D echocardiogram (01/2013)   Chronic kidney disease    Chronic lower back pain    Chronic pain syndrome 12/03/2011   Likely secondary to depression, fibromyalgia, neuropathy, and obesity. Lumbar MRI 2014 no sig change from prior (2008) : Stable hypertrophic facet disease most notable at L4-5. Stable shallow left foraminal/extraforaminal disc protrusion at L4-5. No direct neural compression.       Chronic right shoulder pain 10/10/2021   COPD 01/08/2007   PFT's 05/2007 : FEV1/FVC 82, FEV1 64% pred, FEF 25-75% 40% predicted, 16% improvement in FEV1 with bronchodilators.      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 Right brachial vein, diagnosed on 03/10/2013 Coumadin  for 3 months. End  date 06/10/2013    Environmental allergies    Hx: of   Fatty liver 2003   observed on ultrasound abdomen   Fibromyalgia    GERD (gastroesophageal reflux disease)    Glaucoma    H/O above knee amputation, left (HCC) 07/11/2021   Revision in 2020 to AKA from BKA 2014.   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  S. Aureus and GBS.    History of use of hearing aid    Hyperlipidemia    Hyperplastic colon polyp 12/2010   Per colonoscopy (12/2010) - Dr. 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       Initially evaluated 08/2019 with dedicated MRI: IMPRESSION:  Skin marker overlies a 9 x 10 x 11 mm cyst within the anterior  aspect of the superficial lobe of the right parotid gland. This is  presumed to be a benign cyst. The possibility of a cystic Warthin's  tumor   Pneumonia    PVD (peripheral vascular disease) with claudication (HCC)    Stents to bilateral common iliac arteries (left 2005, right 2008), on chronic plavix    Pyelonephritis 10/28/2020   S/P BKA (below knee amputation) unilateral (HCC)    2014 L - failed limb preserving treatment. 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  Evaluated on 07/08/2021 by podiatrist Dr. Janit who debrided necrotic tissue and prescribed doxycycline .  He will see her again in 3 weeks.  The location of this ulcer on the dorsal aspect of the toe is somewhat atypical for a purely diabetic foot wound, and she does have a strong DP pulse.  I did not examine h   Type II diabetes mellitus with peripheral circulatory disorders, uncontrolled DX: 1993   Insulin  dep. Poor control. Complicated by diabetic foot ulcer and diabetic eye disease.     Upper respiratory tract infection due to COVID-19 virus 07/22/2022   Urinary incontinence, severe, mixed (stress, urge, functional) 02/14/2023   Unresponsive to medications    Past Surgical History:  Procedure Laterality Date   ABDOMINAL AORTOGRAM W/LOWER EXTREMITY N/A 03/28/2024   Procedure: ABDOMINAL AORTOGRAM W/LOWER EXTREMITY;  Surgeon: Pearline Norman RAMAN, MD;  Location: [facility] INVASIVE CV LAB;  Service: Cardiovascular;  Laterality: N/A;   ABDOMINAL HYSTERECTOMY  1997   secondary to uterine fibroids    AMPUTATION Left 08/31/2013   Procedure: AMPUTATION RAY;  Surgeon: Jerona LULLA Sage, MD;  Location: [facility] OR;  Service: Orthopedics;  Laterality: Left;  Left Foot 5th Ray Amputation   AMPUTATION Left 09/28/2013   Procedure: Left Midfoot amputation;  Surgeon: Jerona LULLA Sage, MD;  Location: [facility] OR;  Service: Orthopedics;  Laterality: Left;  Left Midfoot amputation   AMPUTATION Left 10/14/2013   Procedure: AMPUTATION BELOW KNEE- left;  Surgeon: Jerona LULLA Sage,  MD;  Location: [facility] OR;  Service: Orthopedics;  Laterality: Left;  Left Below Knee Amputation    AMPUTATION Right 04/11/2024   Procedure: RIGHT AMPUTATION BELOW KNEE;  Surgeon: Lanis Fonda BRAVO, MD;  Location: [facility] OR;  Service: Vascular;  Laterality: Right;   AMPUTATION TOE Right 01/15/2017   Procedure: AMPUTATION 5th TOE RIGHT FOOT;  Surgeon: Thresa CHRISTELLA Sar, DPM;  Location: [facility] OR;  Service: Podiatry;  Laterality: Right;   AMPUTATION TOE Right 02/12/2023   Procedure: Right Foot Transmetatarsal Amputation;  Surgeon: Gershon Donnice SAUNDERS, DPM;  Location: [facility] OR;  Service: Podiatry;  Laterality: Right;   APPLICATION OF WOUND VAC  04/01/2019   Procedure: Application Of Wound Vac;  Surgeon: Harden Jerona GAILS, MD;  Location: [facility] OR;  Service: Orthopedics;;   BLADDER SURGERY     bladder reconstruction surgery   BOTOX  INJECTION N/A 08/21/2021   Procedure: CYSTOSCOPY BOTOX  INJECTION;  Surgeon: Carolee Sherwood JONETTA DOUGLAS, MD;  Location: [facility] ORS;  Service: Urology;  Laterality: N/A;   BREAST BIOPSY     multiple-benign per pt   CATARACT EXTRACTION, BILATERAL     summer 2022   COLONOSCOPY     DILATION AND CURETTAGE OF UTERUS  1985   ESOPHAGOGASTRODUODENOSCOPY N/A 09/20/2013   Procedure: ESOPHAGOGASTRODUODENOSCOPY (EGD);  Surgeon: Gordy CHRISTELLA Starch, MD;  Location: [facility] ENDOSCOPY;  Service: Gastroenterology;  Laterality: N/A;   EYE SURGERY Bilateral 2022   Cataract removal in June and then July   FOOT AMPUTATION THROUGH METATARSAL Left 09/28/2013   GANGLION CYST EXCISION     multiple    LOWER EXTREMITY INTERVENTION Right 03/28/2024   Procedure: LOWER EXTREMITY INTERVENTION;  Surgeon: Pearline Norman RAMAN, MD;  Location: [facility] INVASIVE CV LAB;  Service: Cardiovascular;  Laterality: Right;   METATARSAL HEAD EXCISION Right 03/29/2024   Procedure: EXCISION, METATARSAL BONE, HEAD;  Surgeon: Malvin Marsa FALCON, DPM;  Location: [facility] OR;  Service: Orthopedics/Podiatry;  Laterality: Right;  Irrigation and Debridment right foot with partial fifth met resection, antibiotic beads, wound vac   PAROTIDECTOMY Right 05/14/2022   Procedure: PAROTIDECTOMY;  Surgeon: Carlie Clark, MD;  Location: [facility] OR;  Service: ENT;  Laterality: Right;   PERIPHERAL VASCULAR INTERVENTION     stents in lower ext   PREAURICULAR CYST EXCISION N/A 05/14/2022   Procedure: EXCISION OF SCALP SKIN CYST, 1.5cm;  Surgeon: Carlie Clark, MD;  Location: [facility] OR;  Service: ENT;  Laterality: N/A;   SHOULDER ARTHROSCOPY Right 11/11/2019   RIGHT SHOULDER ARTHROSCOPY AND DEBRIDEMENT    SHOULDER ARTHROSCOPY Right 11/11/2019   Procedure: RIGHT SHOULDER ARTHROSCOPY AND DEBRIDEMENT;  Surgeon: Harden Jerona GAILS, MD;  Location: [facility] OR;  Service: Orthopedics;  Laterality: Right;   SHOULDER ARTHROSCOPY W/ ROTATOR CUFF REPAIR Bilateral    2 on right one on left   SKIN SPLIT GRAFT Bilateral 05/13/2013   Procedure: Right and Left Foot Allograft Skin Graft;  Surgeon: Jerona GAILS Harden, MD;  Location: [facility] OR;  Service: Orthopedics;  Laterality: Bilateral;  Right and Left Foot Allograft Skin Graft   STUMP REVISION Left 04/01/2019   Procedure: REVISION LEFT BELOW KNEE AMPUTATION;  Surgeon: Harden Jerona GAILS, MD;  Location: [facility] OR;  Service: Orthopedics;  Laterality: Left;   TEE WITHOUT CARDIOVERSION N/A 01/31/2013   Procedure: TRANSESOPHAGEAL ECHOCARDIOGRAM (TEE);  Surgeon: Vina GAILS Gull, MD;  Location: [facility] ENDOSCOPY;  Service: Cardiovascular;  Laterality: N/A;  Rm 3W25   TEE WITHOUT CARDIOVERSION N/A 03/10/2013   Procedure: TRANSESOPHAGEAL ECHOCARDIOGRAM (TEE);   Surgeon: Ezra RAMAN Shuck, MD;  Location: [facility]  ENDOSCOPY;  Service: Cardiovascular;  Laterality: N/A;  Rm. [id number]   TOE AMPUTATION Left 08/31/2013   4TH & 5 TH TOE    TONSILLECTOMY     TUBAL LIGATION     WRIST SURGERY Right    for tumors (09/28/2013)    Family History: Family History  Problem Relation Age of Onset   Diverticulosis Mother    Diabetes Mother    Hypertension Mother    Congestive Heart Failure Mother    Asthma Father    CAD Sister 19       MI at age 49 per patient.  However, she has not had a stent or CABG.    Heart disease Sister        before age 72   Colon cancer Brother    Breast cancer Neg Hx    Colon polyps Neg Hx    Rectal cancer Neg Hx    Stomach cancer Neg Hx    Esophageal cancer Neg Hx     Social History  reports that she has been smoking cigarettes. She started smoking about 53 years ago. She has a 26.8 pack-year smoking history. She has never used smokeless tobacco. She reports that she does not drink alcohol and does not use drugs.  Allergies  Allergen Reactions   Benazepril  Swelling    Possible tongue swelling 06/14/2021   Abilify  [Aripiprazole ] Other (See Comments)    Urinary freq Nov 2016   Beef-Derived Drug Products Diarrhea   Chantix  [Varenicline ] Other (See Comments)    Unknown reaction   Iohexol Other (See Comments)    IV CONTRAST CAUSED TRANSIENT NEPHROPATHY (KIDNEY INSUFFICIENCY) IN 2007    Ivp Dye [Iodinated Contrast Media] Other (See Comments)    IV CONTRAST CAUSED TRANSIENT NEPHROPATHY (KIDNEY INSUFFICIENCY) IN 2007   Morphine Sulfate Other (See Comments)    Unknown reaction    Medications   Current Facility-Administered Medications:     stroke: early stages of recovery book, , Does not apply, Once, Michaela Aisha SQUIBB, MD   acetaminophen  (TYLENOL ) tablet 650 mg, 650 mg, Oral, Q6H PRN, 650 mg at 07/17/24 1834 **OR** acetaminophen  (TYLENOL ) suppository 650 mg, 650 mg, Rectal, Q6H PRN, Tobie, Amar, DO   albuterol  (PROVENTIL )  (2.5 MG/3ML) 0.083% nebulizer solution 3 mL, 3 mL, Inhalation, Q6H PRN, Tobie, Amar, DO   amLODipine  (NORVASC ) tablet 10 mg, 10 mg, Oral, Daily, Patel, Amar, DO, 10 mg at 07/17/24 0853   aspirin  EC tablet 81 mg, 81 mg, Oral, Daily, Michaela Aisha SQUIBB, MD, 81 mg at 07/17/24 2243   cefTRIAXone  (ROCEPHIN ) 1 g in sodium chloride  0.9 % 100 mL IVPB, 1 g, Intravenous, Q24H, Zheng, Michael, DO, Last Rate: 200 mL/hr at 07/17/24 1317, 1 g at 07/17/24 1317   [COMPLETED] clopidogrel  (PLAVIX ) tablet 300 mg, 300 mg, Oral, Once, 300 mg at 07/17/24 2242 **AND** [START ON 07/18/2024] clopidogrel  (PLAVIX ) tablet 75 mg, 75 mg, Oral, Daily, Michaela Aisha SQUIBB, MD   DULoxetine  (CYMBALTA ) DR capsule 60 mg, 60 mg, Oral, Daily, Zheng, Michael, DO, 60 mg at 07/17/24 1205   enoxaparin  (LOVENOX ) injection 40 mg, 40 mg, Subcutaneous, Q24H, Patel, Amar, DO, 40 mg at 07/17/24 2242   hydrOXYzine  (ATARAX ) tablet 25 mg, 25 mg, Oral, TID PRN, Tobie Gaines, DO, 25 mg at 07/17/24 2242   insulin  aspart (novoLOG ) injection 0-15 Units, 0-15 Units, Subcutaneous, TID WC, Patel, Amar, DO, 5 Units at 07/17/24 1750   insulin  aspart (novoLOG ) injection 0-5 Units, 0-5 Units, Subcutaneous, QHS, Patel, Amar, DO, 2 Units at  07/17/24 2243   [START ON 07/18/2024] insulin  glargine-yfgn (SEMGLEE ) injection 20 Units, 20 Units, Subcutaneous, Daily, Zheng, Michael, DO   pantoprazole  (PROTONIX ) EC tablet 40 mg, 40 mg, Oral, Daily, Tobie Gaines, DO, 40 mg at 07/17/24 9147   polyethylene glycol (MIRALAX  / GLYCOLAX ) packet 17 g, 17 g, Oral, Daily, Zheng, Michael, DO   pregabalin  (LYRICA ) capsule 25 mg, 25 mg, Oral, TID, Zheng, Michael, DO, 25 mg at 07/17/24 2242   rosuvastatin  (CRESTOR ) tablet 20 mg, 20 mg, Oral, q AM, Tobie Gaines, DO, 20 mg at 07/17/24 9388   senna (SENOKOT) tablet 8.6 mg, 1 tablet, Oral, Daily, Zheng, Michael, DO, 8.6 mg at 07/16/24 9050   spironolactone  (ALDACTONE ) tablet 50 mg, 50 mg, Oral, Daily, Tobie Gaines, DO, 50 mg at 07/17/24  0855   umeclidinium-vilanterol (ANORO ELLIPTA ) 62.5-25 MCG/ACT 1 puff, 1 puff, Inhalation, Daily, Tobie Gaines, DO, 1 puff at 07/17/24 1205  Vitals   Vitals:   07/17/24 0748 07/17/24 1106 07/17/24 1613 07/17/24 2013  BP: (!) 148/96 (!) 148/71 (!) 142/79 (!) 154/79  Pulse: 85 73 84 81  Resp: 18 18 18 17   Temp: 98.7 F (37.1 C) 98.7 F (37.1 C)  98 F (36.7 C)  TempSrc: Oral Oral  Oral  SpO2: 96% 96% 98% 99%  Weight:      Height:        Body mass index is 40.92 kg/m.   Physical Exam   Constitutional: Appears well-developed and well-nourished.     Neurologic Examination    Neuro: Mental Status: Patient is awake, alert, oriented to person, place, month, year, and situation. Patient is able to give a clear and coherent history. She is able to name multiple simple objects are present to her, she is able to repeat without difficulty, her speech is fluent.  She is dysarthric. Cranial Nerves: II: Visual Fields are full. Pupils are equal, round, and reactive to light.   III,IV, VI: She has a left exotropia which she states is baseline V: Facial sensation is symmetric to temperature VII: She has abnormal mouth movements, ? tardive VIII: hearing is intact to voice Motor: She has good strength confrontation bilaterally, no drift in any extremity sensory: Sensation is symmetric to light touch and temperature in the arms and legs.  She does not extinguish  Cerebellar: FNF intact bilaterally        Labs/Imaging/Neurodiagnostic studies   CBC:  Recent Labs  Lab 07-29-2024 1600 07/15/24 0329 07/16/24 0826 07/17/24 1242  WBC 8.5   < > 5.6 6.9  NEUTROABS 4.3  --   --   --   HGB 11.1*   < > 11.3* 11.3*  HCT 36.4   < > 36.3 36.7  MCV 84.5   < > 83.3 84.4  PLT 181   < > 210 197   < > = values in this interval not displayed.   Basic Metabolic Panel:  Lab Results  Component Value Date   NA 136 07/17/2024   K 4.5 07/17/2024   CO2 20 (L) 07/17/2024   GLUCOSE 252 (H)  07/17/2024   BUN <5 (L) 07/17/2024   CREATININE 0.99 07/17/2024   CALCIUM  9.0 07/17/2024   GFRNONAA >60 07/17/2024   GFRAA 54 (L) 10/25/2020   Lipid Panel:  Lab Results  Component Value Date   LDLCALC 41 03/29/2024   HgbA1c:  Lab Results  Component Value Date   HGBA1C 9.8 (H) 04/06/2024   Urine Drug Screen:     Component Value Date/Time  LABOPIA NONE DETECTED 07/14/2024 1620   COCAINSCRNUR NONE DETECTED 07/14/2024 1620   COCAINSCRNUR NEG 02/28/2014 1222   LABBENZ NONE DETECTED 07/14/2024 1620   LABBENZ NEG 06/28/2010 2130   AMPHETMU NONE DETECTED 07/14/2024 1620   THCU POSITIVE (A) 07/14/2024 1620   LABBARB NONE DETECTED 07/14/2024 1620    Alcohol Level     Component Value Date/Time   ETH <15 07/14/2024 1600   INR  Lab Results  Component Value Date   INR 1.3 (H) 04/05/2024   APTT  Lab Results  Component Value Date   APTT 26 10/31/2023    MRI Brain(Personally reviewed): Embolic appearing left parietal stroke  ASSESSMENT   Jennifer [name] is a 63 y.o. female with embolic appearing left parietal stroke presenting as confusion.  She will need a embolic workup with echo, vascular imaging, and also workup for modifiable risk factors including checking her current A1c and LDL.  In the short-term, I will start her on aspirin  and Plavix .  RECOMMENDATIONS  Aspirin  81 mg and Plavix  75 mg after 300 mg load Lipids, A1c Echo, telemetry MRA head, carotid Dopplers PT, OT, ST evaluations Stroke team to follow ______________________________________________________________________    Signed, Aisha Seals, MD [facility]

## 2024-07-17 NOTE — Progress Notes (Addendum)
 HD#2 SUBJECTIVE:  Patient Summary: Jennifer [name] is a 63 y.o. person living with a history of COPD, HLD, DM who presented with confusion and admitted for encephalopathy.   Overnight Events: No overnight events   Interim History: Patient evaluated at bedside with sitter present. Was tearful and distraught after we entered room. Reports bilateral stump pain that is aching. Had headache that improved. Denies back, chest or abdominal pain. States still confused but unable to tell if changed since admission. Oriented to self, place and time. Patient agreed to proceed with brain MRI today.   OBJECTIVE:  Vital Signs: Vitals:   07/16/24 1806 07/16/24 2028 07/17/24 0400 07/17/24 0502  BP: (!) 148/69 139/74  (!) 153/52  Pulse: 89 79  67  Resp: 18 17  17   Temp: 98.6 F (37 C) 98.2 F (36.8 C)  98 F (36.7 C)  TempSrc:  Oral  Oral  SpO2: 97%   96%  Weight:   88.8 kg   Height:       Supplemental O2: Room Air SpO2: 96 %  Filed Weights   07/14/24 1458 07/17/24 0400  Weight: 99 kg 88.8 kg     Intake/Output Summary (Last 24 hours) at 07/17/2024 0557 Last data filed at 07/17/2024 9495 Gross per 24 hour  Intake 500 ml  Output 0 ml  Net 500 ml   Net IO Since Admission: 1,333.79 mL [07/17/24 0557]  Physical Exam:  Physical Exam Constitutional:      General: She is not in acute distress.    Appearance: She is not ill-appearing.     Comments: Patient is tearful and distraught when entering room.   Cardiovascular:     Rate and Rhythm: Normal rate.  Pulmonary:     Effort: Pulmonary effort is normal. No respiratory distress.  Musculoskeletal:     Comments: Bilateral BKA. No signs of open wounds or drainage. No erythema or TTP noted.  Skin:    General: Skin is warm and dry.  Neurological:     Mental Status: She is alert and oriented to person, place, and time.     Comments: Moves all extremities on command.  Psychiatric:        Attention and Perception: She is inattentive.         Mood and Affect: Affect is labile and tearful.     Patient Lines/Drains/Airways Status     Active Line/Drains/Airways     Name Placement date Placement time Site Days   Peripheral IV 07/14/24 20 G Left Antecubital 07/14/24  1655  Antecubital  1   Wound / Incision (Open or Dehisced) 11/07/20 (MASD) Moisture Associated Skin Damage Buttocks Right;Left;Bilateral MASD redness bilateral buttocks 11/07/20  1800  Buttocks  1346   Wound / Incision (Open or Dehisced) 03/25/24 Venous stasis ulcer Leg Right Full thickness R lower leg 03/25/24  0800  Leg  112   Wound / Incision (Open or Dehisced) 03/25/24 Other (Comment) Right R foot necrotic tissue 03/25/24  0800  --  112            Pertinent labs and imaging:      Latest Ref Rng & Units 07/16/2024    8:26 AM 07/15/2024    3:29 AM 07/14/2024    4:00 PM  CBC  WBC 4.0 - 10.5 K/uL 5.6  8.6  8.5   Hemoglobin 12.0 - 15.0 g/dL 88.6  89.5  88.8   Hematocrit 36.0 - 46.0 % 36.3  33.8  36.4   Platelets 150 -  400 K/uL 210  180  181        Latest Ref Rng & Units 07/16/2024    8:26 AM 07/15/2024    3:29 AM 07/14/2024    4:00 PM  CMP  Glucose 70 - 99 mg/dL 806  864  804   BUN 8 - 23 mg/dL 5  6  8    Creatinine 0.44 - 1.00 mg/dL 9.27  9.07  9.04   Sodium 135 - 145 mmol/L 138  141  139   Potassium 3.5 - 5.1 mmol/L 3.8  2.9  3.2   Chloride 98 - 111 mmol/L 104  107  104   CO2 22 - 32 mmol/L 22  24  22    Calcium  8.9 - 10.3 mg/dL 8.7  8.8  9.1   Total Protein 6.5 - 8.1 g/dL   6.9   Total Bilirubin 0.0 - 1.2 mg/dL   0.7   Alkaline Phos 38 - 126 U/L   72   AST 15 - 41 U/L   21   ALT 0 - 44 U/L   14     No results found.   ASSESSMENT/PLAN:  Assessment: Principal Problem:   Encephalopathy Active Problems:   Hyperlipidemia associated with type 2 diabetes mellitus (HCC)   Major depression in partial remission (HCC)   Hypertension   GASTROESOPHAGEAL REFLUX DISEASE   Chronic back pain due to lumbar radiculopathy   Peripheral arterial disease  with history of revascularization (HCC)   CKD stage 2 due to type 2 diabetes mellitus (HCC)   Urinary incontinence   COPD (chronic obstructive pulmonary disease) (HCC)   Type 2 diabetes mellitus (HCC)   Hypokalemia   Renal lesion  Jennifer [name] is a 63 y.o. person living with a history of COPD, HLD, DM who presented with confusion and admitted for encephalopathy.  Plan: #Labile mood vs Encephalopathy, unclear etiology  #Delirium  #UTI Remains A&Ox3 but intermittently distraught and tearful, altered from her baseline. Today reports bilateral stump pain which is chronic. Did not get brain MRI yesterday since patient declined. Discussed with patient today and she is now agreeable with 1x Ativan  given before MRI. BMP and CBC has been stable yesterday. Remains afebrile and no leukocytosis. Labile mood and noted calm affect prior to entering room. Denies any changes in home environment or acute issues with son. Did leave SNF about 1 month ago and noted by son to have depressed mood and not herself which she told him felt like she was in shock when she was surrounded by people at SNF with similar conditions/situations.  - Continue 1:1 sitter observation with delirium precautions in place - Encourage po fluid intake - Holding most of her centrally acting medications at this time, will restart pregabalin  at lower dose for neuropathic/phantom pain - F/u AM BMP and CBC  - Pending MRI brain, ordered 1x IV ativan  to be given before MRI  - F/u 7/26 blood cultures: so far NG - Ucx grew >100k Klebsiella sensitive to CTX, continue CTX (day 2/3)  #Major depression in partial remission (HCC) Initially held home duloxetine . Patient's mental state could be playing apart in her presentation. Denies any changes in home environment or acute issues with son. Did leave SNF about 1 month ago and noted by son to have depressed mood and not herself which she told him felt like she was in shock when she was surrounded  by people at SNF with similar conditions/situations. Son reports at home that patient was staring off  into space, yelling at people, and for the last couple of days was refusing to eat and drink after returning SNF. Questions if this is contributing given labile affect.  - Restart home duloxetine  60 mg daily   #Hypertension  BP mildly hypertensive.  - Continue home amlodipine  10 mg, spironolactone  50 mg daily   #Polypharmacy  - Patient is on many medications and those medications could have changed when leaving nursing facility. Will need medication reconciliation during this hospitalization. Multiple medication could also be affecting her mental state. Will work on this throughout hospitalization.    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 Reports bilateral stump pain that she reports is her chronic pain. Noted her pregabalin  dose was increased recently to 225 mg TID from 200 mg TID per PDMP report.  - Restart pregabalin  25 mg TID, titrate closely  - Holding home oxycodone  at this time, watching signs of possible withdrawal   #Urinary Incontinence Patient is prescribed Myrbetriq  and Oxybutynin . Will hold these medications at this time. Baldder scan so far not retaining.  - Continue Q8H bladder scan. If retaining over 250 ml, perform in-and-out cath   - On Abx per above for UTI   #Decreased TSH  - Mildly decreased TSH, free T4 mildly elevated. Less suspicious that this is contributing to current presentation.   #Type 2 Diabetes Mellitus A1c 9.8. Fasting CBG elevated today, appetite better and restarted long acting at 15 units. PTA on long acting insulin  reported 65 units daily. - CBG monitoring  - Novolog  SSI-M ACHS - Increase Semglee  to 20 units daily   #Hypomagnesemia  Mag low 1.6 today. Replete today with mag sulfate 2g.  - Replete today, monitor mag level  Stable Chronic Conditions:  GERD- continue home  pantoprazole  40 mg  CKD stage 2 due to Type 2 DM- stable/at baseline, monitor BMP for renal function  Hyperlipidemia associated with T2DM PAD with history of revascularization- continue home rosuvastatin  20 mg COPD- lung exam stable, continue home anora ellipta  Gastroparesis, likely secondary to diabetes- holding home Reglan  10 mg at this time R Renal lesion - 5 cm mass lesion in the midpole of right kidney. Will need follow up with outpatient MRI.  Best Practice: Diet: Regular diet IVF: Fluids: none VTE: enoxaparin  (LOVENOX ) injection 40 mg Start: 07/14/24 2030 Code: Full  Disposition planning: Therapy Recs: HH OT and PT Family Contact: Son notified 7/25, attempt call today  DISPO: Anticipated discharge pending clinical improvement.   Signature: Ozell [facility]  5:57 AM, 07/17/2024  On Call pager [phone]

## 2024-07-17 NOTE — Consult Note (Incomplete)
 NEUROLOGY CONSULT NOTE   Date of service: July 17, 2024 Patient Name: MARITSSA HAUGHTON MRN:  [id number] DOB:  [birth year] Chief Complaint: confusion Requesting Provider: Karna Fellows, MD  History of Present Illness  LACRETIA TINDALL is a 63 y.o. female with hx of ***  LKW: *** Modified rankin score: {Modified Rankin Scale:21264} IV Thrombolysis: ***Yes, *** No (reason) EVT: ***Yes, *** No (reason) ICH Score:***  NIHSS components Score: Comment  1a Level of Conscious 0[x]  1[]  2[]  3[]      1b LOC Questions 0[x]  1[]  2[]       1c LOC Commands 0[x]  1[]  2[]       2 Best Gaze 0[]  1[x]  2[]       3 Visual 0[x]  1[]  2[]  3[]      4 Facial Palsy 0[x]  1[]  2[]  3[]      5a Motor Arm - left 0[x]  1[]  2[]  3[]  4[]  UN[]    5b Motor Arm - Right 0[x]  1[]  2[]  3[]  4[]  UN[]    6a Motor Leg - Left 0[x]  1[]  2[]  3[]  4[]  UN[]    6b Motor Leg - Right 0[x]  1[]  2[]  3[]  4[]  UN[]    7 Limb Ataxia 0[x]  1[]  2[]  UN[]      8 Sensory 0[x]  1[]  2[]  UN[]      9 Best Language 0[x]  1[]  2[]  3[]      10 Dysarthria 0[]  1[x]  2[]  UN[]      11 Extinct. and Inattention 0[x]  1[]  2[]       TOTAL: 2      Past History   Past Medical History:  Diagnosis Date  . Acute vestibular syndrome, resolved 03/02/2021  . Angioedema 06/14/2021  . Asthma   . Burn of finger of right hand, second degree 02/05/2021   Occurred during cooking (frying), poor sensation due to neuropathy, pt punctured blister to allow it to drain, skin has since desquamated over dorsal joint, no infection.  Keep clean and dry, OTC antibacterial ointment.  . Cataract   . CHF (congestive heart failure) (HCC)   . Chronic bronchitis (HCC)    I get it alot (09/28/2013)  . Chronic diastolic heart failure (HCC)    grade 2 per 2D echocardiogram (01/2013)  . Chronic kidney disease   . Chronic lower back pain   . Chronic pain syndrome 12/03/2011   Likely secondary to depression, fibromyalgia, neuropathy, and obesity. Lumbar MRI 2014 no sig change from prior (2008) : Stable  hypertrophic facet disease most notable at L4-5. Stable shallow left foraminal/extraforaminal disc protrusion at L4-5. No direct neural compression.      . Chronic right shoulder pain 10/10/2021  . COPD 01/08/2007   PFT's 05/2007 : FEV1/FVC 82, FEV1 64% pred, FEF 25-75% 40% predicted, 16% improvement in FEV1 with bronchodilators.     . Depression   . Diabetic peripheral neuropathy (HCC)   . Dizziness, resolved (admitted with vestibular migraine)   . DM (diabetes mellitus), type 2 with complications (HCC) 04/02/2007  . DVT of upper extremity (deep vein thrombosis) (HCC) 03/11/2013   Secondary to PICC line. Right brachial vein, diagnosed on 03/10/2013 Coumadin  for 3 months. End date 06/10/2013   . Environmental allergies    Hx: of  . Fatty liver 2003   observed on ultrasound abdomen  . Fibromyalgia   . GERD (gastroesophageal reflux disease)   . Glaucoma   . 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  SABRA Headache   . 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 History of bacterial endocarditis 2014   Endocarditis involving mitral and tricuspid valves.  S. Aureus and GBS.   SABRA History of use of hearing aid   . Hyperlipidemia   . Hyperplastic colon polyp 12/2010   Per colonoscopy (12/2010) - Dr. Debrah  . Hypertension   . Juvenile rheumatoid arthritis (HCC)    Diagnosed age 52; treated initially with lots of aspirin   . Nausea and vomiting 11/24/2008  . Parotid nodules, resected 05/2022, benign 07/11/2021   Incidental finding 03/04/21  multiple bilateral parotid nodules the largest in the right gland measuring 11 mm, asymptomatic.       Initially evaluated 08/2019 with dedicated MRI: IMPRESSION:  Skin marker overlies a 9 x 10 x 11 mm cyst within the anterior  aspect of the superficial lobe of the right parotid gland. This is  presumed to be a benign cyst. The possibility of a cystic  Warthin's  tumor  . Pneumonia   . PVD (peripheral vascular disease) with claudication (HCC)    Stents to bilateral common iliac arteries (left 2005, right 2008), on chronic plavix   . Pyelonephritis 10/28/2020  . S/P BKA (below knee amputation) unilateral (HCC)    2014 L - failed limb preserving treatment. 2/2 tobacco use, DM, and cont weight bearing on surgical wound and developed gangrene   . Spinal stenosis   . Tobacco abuse   . 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  Evaluated on 07/08/2021 by podiatrist Dr. Janit who debrided necrotic tissue and prescribed doxycycline .  He will see her again in 3 weeks.  The location of this ulcer on the dorsal aspect of the toe is somewhat atypical for a purely diabetic foot wound, and she does have a strong DP pulse.  I did not examine h  . Type II diabetes mellitus with peripheral circulatory disorders, uncontrolled DX: 1993   Insulin  dep. Poor control. Complicated by diabetic foot ulcer and diabetic eye disease.    SABRA Upper respiratory tract infection due to COVID-19 virus 07/22/2022  . Urinary incontinence, severe, mixed (stress, urge, functional) 02/14/2023   Unresponsive to medications    Past Surgical History:  Procedure Laterality Date  . ABDOMINAL AORTOGRAM W/LOWER EXTREMITY N/A 03/28/2024   Procedure: ABDOMINAL AORTOGRAM W/LOWER EXTREMITY;  Surgeon: Pearline Norman RAMAN, MD;  Location[facility]VASIVE CV LAB;  Service: Cardiovascular;  Laterality: N/A;  . ABDOMINAL HYSTERECTOMY  1997   secondary to uterine fibroids  . AMPUTATION Left 08/31/2013   Procedure: AMPUTATION RAY;  Surgeon: Jerona LULLA Sage, MD;  Location: [facility] OR;  Service: Orthopedics;  Laterality: Left;  Left Foot 5th Ray Amputation  . AMPUTATION Left 09/28/2013   Procedure: Left Midfoot amputation;  Surgeon: Jerona LULLA Sage, MD;  Location: [facility] OR;  Service: Orthopedics;  Laterality:  Left;  Left Midfoot amputation  . AMPUTATION Left 10/14/2013   Procedure: AMPUTATION BELOW KNEE- left;  Surgeon: Jerona LULLA Sage, MD;  Location: [facility] OR;  Service: Orthopedics;  Laterality: Left;  Left Below Knee Amputation   . AMPUTATION Right 04/11/2024   Procedure: RIGHT AMPUTATION BELOW KNEE;  Surgeon: Lanis Fonda BRAVO, MD;  Location: [facility] OR;  Service: Vascular;  Laterality: Right;  . AMPUTATION TOE Right 01/15/2017   Procedure: AMPUTATION 5th TOE RIGHT FOOT;  Surgeon: Thresa CHRISTELLA Janit, DPM;  Location: [facility] OR;  Service: Podiatry;  Laterality: Right;  .  AMPUTATION TOE Right 02/12/2023   Procedure: Right Foot Transmetatarsal Amputation;  Surgeon: Gershon Donnice SAUNDERS, DPM;  Location: [facility] OR;  Service: Podiatry;  Laterality: Right;  . APPLICATION OF WOUND VAC  04/01/2019   Procedure: Application Of Wound Vac;  Surgeon: Harden Jerona GAILS, MD;  Location: [facility] OR;  Service: Orthopedics;;  . BLADDER SURGERY     bladder reconstruction surgery  . BOTOX  INJECTION N/A 08/21/2021   Procedure: CYSTOSCOPY BOTOX  INJECTION;  Surgeon: Carolee Sherwood JONETTA DOUGLAS, MD;  Location: [facility] ORS;  Service: Urology;  Laterality: N/A;  . BREAST BIOPSY     multiple-benign per pt  . CATARACT EXTRACTION, BILATERAL     summer 2022  . COLONOSCOPY    . DILATION AND CURETTAGE OF UTERUS  1985  . ESOPHAGOGASTRODUODENOSCOPY N/A 09/20/2013   Procedure: ESOPHAGOGASTRODUODENOSCOPY (EGD);  Surgeon: Gordy CHRISTELLA Starch, MD;  Location: [facility] ENDOSCOPY;  Service: Gastroenterology;  Laterality: N/A;  . EYE SURGERY Bilateral 2022   Cataract removal in June and then July  . FOOT AMPUTATION THROUGH METATARSAL Left 09/28/2013  . GANGLION CYST EXCISION     multiple  . LOWER EXTREMITY INTERVENTION Right 03/28/2024   Procedure: LOWER EXTREMITY INTERVENTION;  Surgeon: Pearline Norman RAMAN, MD;  Location: [facility] INVASIVE CV LAB;  Service: Cardiovascular;  Laterality: Right;  . METATARSAL HEAD EXCISION Right 03/29/2024   Procedure: EXCISION, METATARSAL BONE, HEAD;  Surgeon: Malvin Marsa FALCON, DPM;  Location: [facility] OR;  Service: Orthopedics/Podiatry;  Laterality: Right;  Irrigation and Debridment right foot with partial fifth met resection, antibiotic beads, wound vac  . PAROTIDECTOMY Right 05/14/2022   Procedure: PAROTIDECTOMY;  Surgeon: Carlie Clark, MD;  Location: [facility] OR;  Service: ENT;  Laterality: Right;  . PERIPHERAL VASCULAR INTERVENTION     stents in lower ext  . PREAURICULAR CYST EXCISION N/A 05/14/2022   Procedure: EXCISION OF SCALP SKIN CYST, 1.5cm;  Surgeon: Carlie Clark, MD;  Location: [facility] OR;  Service: ENT;  Laterality: N/A;  . SHOULDER ARTHROSCOPY Right 11/11/2019   RIGHT SHOULDER ARTHROSCOPY AND DEBRIDEMENT   . SHOULDER ARTHROSCOPY Right 11/11/2019   Procedure: RIGHT SHOULDER ARTHROSCOPY AND DEBRIDEMENT;  Surgeon: Harden Jerona GAILS, MD;  Location: [facility] OR;  Service: Orthopedics;  Laterality: Right;  . SHOULDER ARTHROSCOPY W/ ROTATOR CUFF REPAIR Bilateral    2 on right one on left  . SKIN SPLIT GRAFT Bilateral 05/13/2013   Procedure: Right and Left Foot Allograft Skin Graft;  Surgeon: Jerona GAILS Harden, MD;  Location: [facility] OR;  Service: Orthopedics;  Laterality: Bilateral;  Right and Left Foot Allograft Skin Graft  . STUMP REVISION Left 04/01/2019   Procedure: REVISION LEFT BELOW KNEE AMPUTATION;  Surgeon: Harden Jerona GAILS, MD;  Location: [facility] OR;  Service: Orthopedics;  Laterality: Left;  . TEE WITHOUT CARDIOVERSION N/A 01/31/2013   Procedure: TRANSESOPHAGEAL ECHOCARDIOGRAM (TEE);  Surgeon: Vina GAILS Gull, MD;  Location: [facility] ENDOSCOPY;  Service: Cardiovascular;  Laterality: N/A;  Rm [phone]  . TEE WITHOUT CARDIOVERSION N/A 03/10/2013   Procedure: TRANSESOPHAGEAL ECHOCARDIOGRAM (TEE);  Surgeon: Ezra RAMAN Shuck, MD;  Location: [facility] ENDOSCOPY;  Service: Cardiovascular;  Laterality: N/A;  Rm. [phone]  . TOE AMPUTATION Left 08/31/2013   4TH & 5 TH TOE   . TONSILLECTOMY    . TUBAL LIGATION    . WRIST SURGERY Right    for tumors (09/28/2013)    Family History: Family History   Problem Relation Age of Onset  . Diverticulosis Mother   . Diabetes Mother   . Hypertension Mother   . Congestive  Heart Failure Mother   . Asthma Father   . CAD Sister 65       MI at age 70 per patient.  However, she has not had a stent or CABG.   . Heart disease Sister        before age 40  . Colon cancer Brother   . Breast cancer Neg Hx   . Colon polyps Neg Hx   . Rectal cancer Neg Hx   . Stomach cancer Neg Hx   . Esophageal cancer Neg Hx     Social History  reports that she has been smoking cigarettes. She started smoking about 53 years ago. She has a 26.8 pack-year smoking history. She has never used smokeless tobacco. She reports that she does not drink alcohol and does not use drugs.  Allergies  Allergen Reactions  . Benazepril  Swelling    Possible tongue swelling 06/14/2021  . Abilify  [Aripiprazole ] Other (See Comments)    Urinary freq Nov 2016  . Beef-Derived Drug Products Diarrhea  . Chantix  [Varenicline ] Other (See Comments)    Unknown reaction  . Iohexol Other (See Comments)    IV CONTRAST CAUSED TRANSIENT NEPHROPATHY (KIDNEY INSUFFICIENCY) IN 2007   . Ivp Dye [Iodinated Contrast Media] Other (See Comments)    IV CONTRAST CAUSED TRANSIENT NEPHROPATHY (KIDNEY INSUFFICIENCY) IN 2007  . Morphine Sulfate Other (See Comments)    Unknown reaction    Medications   Current Facility-Administered Medications:  .   stroke: early stages of recovery book, , Does not apply, Once, Michaela Aisha SQUIBB, MD .  acetaminophen  (TYLENOL ) tablet 650 mg, 650 mg, Oral, Q6H PRN, 650 mg at 07/17/24 1834 **OR** acetaminophen  (TYLENOL ) suppository 650 mg, 650 mg, Rectal, Q6H PRN, Tobie Gaines, DO .  albuterol  (PROVENTIL ) (2.5 MG/3ML) 0.083% nebulizer solution 3 mL, 3 mL, Inhalation, Q6H PRN, Tobie Gaines, DO .  amLODipine  (NORVASC ) tablet 10 mg, 10 mg, Oral, Daily, Patel, Amar, DO, 10 mg at 07/17/24 0853 .  aspirin  EC tablet 81 mg, 81 mg, Oral, Daily, Michaela Aisha SQUIBB, MD, 81 mg  at 07/17/24 2243 .  cefTRIAXone  (ROCEPHIN ) 1 g in sodium chloride  0.9 % 100 mL IVPB, 1 g, Intravenous, Q24H, Zheng, Michael, DO, Last Rate: 200 mL/hr at 07/17/24 1317, 1 g at 07/17/24 1317 .  [COMPLETED] clopidogrel  (PLAVIX ) tablet 300 mg, 300 mg, Oral, Once, 300 mg at 07/17/24 2242 **AND** [START ON 07/18/2024] clopidogrel  (PLAVIX ) tablet 75 mg, 75 mg, Oral, Daily, Kaisey Huseby, Aisha SQUIBB, MD .  DULoxetine  (CYMBALTA ) DR capsule 60 mg, 60 mg, Oral, Daily, Zheng, Michael, DO, 60 mg at 07/17/24 1205 .  enoxaparin  (LOVENOX ) injection 40 mg, 40 mg, Subcutaneous, Q24H, Patel, Amar, DO, 40 mg at 07/17/24 2242 .  hydrOXYzine  (ATARAX ) tablet 25 mg, 25 mg, Oral, TID PRN, Tobie Gaines, DO, 25 mg at 07/17/24 2242 .  insulin  aspart (novoLOG ) injection 0-15 Units, 0-15 Units, Subcutaneous, TID WC, Patel, Amar, DO, 5 Units at 07/17/24 1750 .  insulin  aspart (novoLOG ) injection 0-5 Units, 0-5 Units, Subcutaneous, QHS, Tobie Gaines, DO, 2 Units at 07/17/24 2243 .  [START ON 07/18/2024] insulin  glargine-yfgn (SEMGLEE ) injection 20 Units, 20 Units, Subcutaneous, Daily, Zheng, Michael, DO .  pantoprazole  (PROTONIX ) EC tablet 40 mg, 40 mg, Oral, Daily, Tobie Gaines, DO, 40 mg at 07/17/24 0852 .  polyethylene glycol (MIRALAX  / GLYCOLAX ) packet 17 g, 17 g, Oral, Daily, Zheng, Michael, DO .  pregabalin  (LYRICA ) capsule 25 mg, 25 mg, Oral, TID, Zheng, Michael, DO, 25 mg at 07/17/24 2242 .  rosuvastatin  (CRESTOR ) tablet 20 mg, 20 mg, Oral, q AM, Tobie, Amar, DO, 20 mg at 07/17/24 9388 .  senna (SENOKOT) tablet 8.6 mg, 1 tablet, Oral, Daily, Zheng, Michael, DO, 8.6 mg at 07/16/24 9050 .  spironolactone  (ALDACTONE ) tablet 50 mg, 50 mg, Oral, Daily, Tobie Gaines, DO, 50 mg at 07/17/24 0855 .  umeclidinium-vilanterol (ANORO ELLIPTA ) 62.5-25 MCG/ACT 1 puff, 1 puff, Inhalation, Daily, Tobie Gaines, DO, 1 puff at 07/17/24 1205  Vitals   Vitals:   07/17/24 0748 07/17/24 1106 07/17/24 1613 07/17/24 2013  BP: (!) 148/96 (!) 148/71 (!)  142/79 (!) 154/79  Pulse: 85 73 84 81  Resp: 18 18 18 17   Temp: 98.7 F (37.1 C) 98.7 F (37.1 C)  98 F (36.7 C)  TempSrc: Oral Oral  Oral  SpO2: 96% 96% 98% 99%  Weight:      Height:        Body mass index is 40.92 kg/m.   Physical Exam   Constitutional: Appears well-developed and well-nourished.   Neurologic Examination   ***  Labs/Imaging/Neurodiagnostic studies   CBC:  Recent Labs  Lab 26-Jul-2024 1600 07/15/24 0329 07/16/24 0826 07/17/24 1242  WBC 8.5   < > 5.6 6.9  NEUTROABS 4.3  --   --   --   HGB 11.1*   < > 11.3* 11.3*  HCT 36.4   < > 36.3 36.7  MCV 84.5   < > 83.3 84.4  PLT 181   < > 210 197   < > = values in this interval not displayed.   Basic Metabolic Panel:  Lab Results  Component Value Date   NA 136 07/17/2024   K 4.5 07/17/2024   CO2 20 (L) 07/17/2024   GLUCOSE 252 (H) 07/17/2024   BUN <5 (L) 07/17/2024   CREATININE 0.99 07/17/2024   CALCIUM  9.0 07/17/2024   GFRNONAA >60 07/17/2024   GFRAA 54 (L) 10/25/2020   Lipid Panel:  Lab Results  Component Value Date   LDLCALC 41 03/29/2024   HgbA1c:  Lab Results  Component Value Date   HGBA1C 9.8 (H) 04/06/2024   Urine Drug Screen:     Component Value Date/Time   LABOPIA NONE DETECTED 07-26-24 1620   COCAINSCRNUR NONE DETECTED Jul 26, 2024 1620   COCAINSCRNUR NEG 02/28/2014 1222   LABBENZ NONE DETECTED July 26, 2024 1620   LABBENZ NEG 06/28/2010 2130   AMPHETMU NONE DETECTED Jul 26, 2024 1620   THCU POSITIVE (A) 07-26-24 1620   LABBARB NONE DETECTED 26-Jul-2024 1620    Alcohol Level     Component Value Date/Time   ETH <15 2024/07/26 1600   INR  Lab Results  Component Value Date   INR 1.3 (H) 04/05/2024   APTT  Lab Results  Component Value Date   APTT 26 10/31/2023   AED levels: No results found for: PHENYTOIN, ZONISAMIDE, LAMOTRIGINE, LEVETIRACETA  CT Head without contrast(Personally reviewed): ***  CT angio Head and Neck with contrast(Personally  reviewed): ***  MR Angio head without contrast and Carotid Duplex BL(Personally reviewed): ***  MRI Brain(Personally reviewed): ***  Neurodiagnostics rEEG:  ***  ASSESSMENT   CALIANNA KIM is a 63 y.o. female ***  RECOMMENDATIONS  *** ______________________________________________________________________    Bonney Aisha Seals, MD [facility]

## 2024-07-17 NOTE — Plan of Care (Signed)
  Problem: Education: Goal: Knowledge of General Education information will improve Description: Including pain rating scale, medication(s)/side effects and non-pharmacologic comfort measures Outcome: Progressing   Problem: Health Behavior/Discharge Planning: Goal: Ability to manage health-related needs will improve Outcome: Not Progressing   Problem: Clinical Measurements: Goal: Ability to maintain clinical measurements within normal limits will improve Outcome: Not Progressing Goal: Will remain free from infection Outcome: Progressing Goal: Diagnostic test results will improve Outcome: Progressing Goal: Respiratory complications will improve Outcome: Progressing Goal: Cardiovascular complication will be avoided Outcome: Progressing   Problem: Activity: Goal: Risk for activity intolerance will decrease Outcome: Progressing   Problem: Nutrition: Goal: Adequate nutrition will be maintained Outcome: Progressing   Problem: Coping: Goal: Level of anxiety will decrease Outcome: Not Progressing   Problem: Elimination: Goal: Will not experience complications related to bowel motility Outcome: Progressing   Less anxious today. Did go for MRI

## 2024-07-18 ENCOUNTER — Inpatient Hospital Stay (HOSPITAL_COMMUNITY)

## 2024-07-18 ENCOUNTER — Other Ambulatory Visit: Payer: Self-pay | Admitting: Cardiology

## 2024-07-18 DIAGNOSIS — F17210 Nicotine dependence, cigarettes, uncomplicated: Secondary | ICD-10-CM

## 2024-07-18 DIAGNOSIS — R29701 NIHSS score 1: Secondary | ICD-10-CM | POA: Diagnosis not present

## 2024-07-18 DIAGNOSIS — I639 Cerebral infarction, unspecified: Secondary | ICD-10-CM

## 2024-07-18 DIAGNOSIS — I6623 Occlusion and stenosis of bilateral posterior cerebral arteries: Secondary | ICD-10-CM | POA: Diagnosis not present

## 2024-07-18 DIAGNOSIS — F1210 Cannabis abuse, uncomplicated: Secondary | ICD-10-CM

## 2024-07-18 DIAGNOSIS — F324 Major depressive disorder, single episode, in partial remission: Secondary | ICD-10-CM

## 2024-07-18 DIAGNOSIS — I6389 Other cerebral infarction: Secondary | ICD-10-CM | POA: Diagnosis not present

## 2024-07-18 DIAGNOSIS — I6523 Occlusion and stenosis of bilateral carotid arteries: Secondary | ICD-10-CM

## 2024-07-18 DIAGNOSIS — I63412 Cerebral infarction due to embolism of left middle cerebral artery: Secondary | ICD-10-CM | POA: Diagnosis not present

## 2024-07-18 DIAGNOSIS — E785 Hyperlipidemia, unspecified: Secondary | ICD-10-CM

## 2024-07-18 DIAGNOSIS — I69391 Dysphagia following cerebral infarction: Secondary | ICD-10-CM | POA: Diagnosis not present

## 2024-07-18 DIAGNOSIS — M5416 Radiculopathy, lumbar region: Secondary | ICD-10-CM

## 2024-07-18 DIAGNOSIS — Z7984 Long term (current) use of oral hypoglycemic drugs: Secondary | ICD-10-CM

## 2024-07-18 LAB — ECHOCARDIOGRAM COMPLETE
AR max vel: 1.94 cm2
AV Peak grad: 11.6 mmHg
Ao pk vel: 1.7 m/s
Area-P 1/2: 3.42 cm2
Height: 58 in
S' Lateral: 2.4 cm
Weight: 3132.3 [oz_av]

## 2024-07-18 LAB — BASIC METABOLIC PANEL WITH GFR
Anion gap: 11 (ref 5–15)
BUN: <5 mg/dL — ABNORMAL LOW (ref 8–23)
CO2: 22 mmol/L (ref 22–32)
Calcium: 8.9 mg/dL (ref 8.9–10.3)
Chloride: 104 mmol/L (ref 98–111)
Creatinine, Ser: 0.87 mg/dL (ref 0.44–1.00)
GFR, Estimated: >60 mL/min (ref >60)
Glucose, Bld: 188 mg/dL — ABNORMAL HIGH (ref 70–99)
Potassium: 4 mmol/L (ref 3.5–5.1)
Sodium: 137 mmol/L (ref 135–145)

## 2024-07-18 LAB — GLUCOSE, CAPILLARY
Glucose-Capillary: 169 mg/dL — ABNORMAL HIGH (ref 70–99)
Glucose-Capillary: 252 mg/dL — ABNORMAL HIGH (ref 70–99)
Glucose-Capillary: 302 mg/dL — ABNORMAL HIGH (ref 70–99)
Glucose-Capillary: 156 mg/dL — ABNORMAL HIGH (ref 70–99)
Glucose-Capillary: 197 mg/dL — ABNORMAL HIGH (ref 70–99)

## 2024-07-18 LAB — CBC
HCT: 32.9 % — ABNORMAL LOW (ref 36.0–46.0)
Hemoglobin: 10.4 g/dL — ABNORMAL LOW (ref 12.0–15.0)
MCH: 26.3 pg (ref 26.0–34.0)
MCHC: 31.6 g/dL (ref 30.0–36.0)
MCV: 83.3 fL (ref 80.0–100.0)
Platelets: 166 K/uL (ref 150–400)
RBC: 3.95 MIL/uL (ref 3.87–5.11)
RDW: 19.3 % — ABNORMAL HIGH (ref 11.5–15.5)
WBC: 7.1 K/uL (ref 4.0–10.5)
nRBC: 0 % (ref 0.0–0.2)

## 2024-07-18 LAB — HEMOGLOBIN A1C
Hgb A1c MFr Bld: 8.8 % — ABNORMAL HIGH (ref 4.8–5.6)
Mean Plasma Glucose: 205.86 mg/dL

## 2024-07-18 LAB — LIPID PANEL
Cholesterol: 85 mg/dL (ref 0–200)
HDL: 36 mg/dL — ABNORMAL LOW (ref >40)
LDL Cholesterol: 36 mg/dL (ref 0–99)
Total CHOL/HDL Ratio: 2.4 ratio
Triglycerides: 64 mg/dL (ref <150)
VLDL: 13 mg/dL (ref 0–40)

## 2024-07-18 LAB — MAGNESIUM: Magnesium: 1.8 mg/dL (ref 1.7–2.4)

## 2024-07-18 MED ORDER — DIPHENHYDRAMINE HCL 50 MG/ML IJ SOLN
25.0000 mg | Freq: Once | INTRAMUSCULAR | Status: AC
  Administered 2024-07-18: 25 mg via INTRAVENOUS
  Filled 2024-07-18: qty 1

## 2024-07-18 MED ORDER — PROCHLORPERAZINE EDISYLATE 10 MG/2ML IJ SOLN: 10.0000 mg | Freq: Once | INTRAMUSCULAR | Status: DC

## 2024-07-18 MED ORDER — DIVALPROEX SODIUM ER 500 MG PO TB24
500.0000 mg | ORAL_TABLET | Freq: Every day | ORAL | Status: DC
  Administered 2024-07-18 – 2024-07-20 (×3): 500 mg via ORAL
  Filled 2024-07-18 (×3): qty 1

## 2024-07-18 MED ORDER — KETOROLAC TROMETHAMINE 15 MG/ML IJ SOLN
15.0000 mg | Freq: Once | INTRAMUSCULAR | Status: AC
  Administered 2024-07-18: 15 mg via INTRAVENOUS
  Filled 2024-07-18: qty 1

## 2024-07-18 MED ORDER — OXYCODONE HCL 5 MG PO TABS
2.5000 mg | ORAL_TABLET | Freq: Four times a day (QID) | ORAL | Status: DC | PRN
  Administered 2024-07-19 – 2024-07-20 (×2): 2.5 mg via ORAL
  Filled 2024-07-18 (×2): qty 1

## 2024-07-18 NOTE — Progress Notes (Addendum)
 STROKE TEAM PROGRESS NOTE    INTERIM HISTORY/SUBJECTIVE Patient presented with several days of confusion and CT scan and MRI showed left parietal embolic stroke.  MRI of the brain showed no large vessel stenosis or occlusion Patient remains hemodynamically stable and afebrile.  She reports a severe headache.  OBJECTIVE  CBC    Component Value Date/Time   WBC 7.1 07/18/2024 0606   RBC 3.95 07/18/2024 0606   HGB 10.4 (L) 07/18/2024 0606   HGB 12.2 09/01/2022 1000   HCT 32.9 (L) 07/18/2024 0606   HCT 38.5 09/01/2022 1000   PLT 166 07/18/2024 0606   PLT 175 09/01/2022 1000   MCV 83.3 07/18/2024 0606   MCV 93 09/01/2022 1000   MCH 26.3 07/18/2024 0606   MCHC 31.6 07/18/2024 0606   RDW 19.3 (H) 07/18/2024 0606   RDW 14.4 09/01/2022 1000   LYMPHSABS 3.6 07/14/2024 1600   LYMPHSABS 2.7 09/01/2022 1000   MONOABS 0.6 07/14/2024 1600   EOSABS 0.0 07/14/2024 1600   EOSABS 0.1 09/01/2022 1000   BASOSABS 0.0 07/14/2024 1600   BASOSABS 0.1 09/01/2022 1000    BMET    Component Value Date/Time   NA 137 07/18/2024 0606   NA 141 02/18/2024 0932   K 4.0 07/18/2024 0606   CL 104 07/18/2024 0606   CO2 22 07/18/2024 0606   GLUCOSE 188 (H) 07/18/2024 0606   BUN <5 (L) 07/18/2024 0606   BUN 10 02/18/2024 0932   CREATININE 0.87 07/18/2024 0606   CREATININE 0.68 01/31/2015 1641   CALCIUM  8.9 07/18/2024 0606   EGFR 93 02/18/2024 0932   GFRNONAA >60 07/18/2024 0606   GFRNONAA >89 01/31/2015 1641    IMAGING past 24 hours MR ANGIO HEAD WO CONTRAST Result Date: 07/18/2024 EXAM: MR Angiography Head without intravenous Contrast. 07/18/2024 04:46:30 AM TECHNIQUE: Magnetic resonance angiography images of the head without intravenous contrast. Multiplanar 2D and 3D reformatted images are provided for review. COMPARISON: MR head without contrast 06/18/2027. CLINICAL HISTORY: Neuro deficit, acute, stroke suspected. FINDINGS: ANTERIOR CIRCULATION: Signal loss of the internal carotid arteries at the  skull base bilaterally is likely artifactual. Mild atherosclerotic changes are present through the cavernous internal carotid arteries without significant stenosis. The MCA bifurcations are within normal limits bilaterally. Mild attenuation of distal branch vessels is worse on the left. No significant proximal stenosis or occlusion is present. POSTERIOR CIRCULATION: Moderate narrowing is present in the distal PCA branch vessels bilaterally. IMPRESSION: 1. Mild atherosclerotic changes in the cavernous internal carotid arteries without significant stenosis. 2. Mild attenuation of distal MCA  branch vessels, worse on the left. 3. Moderate narrowing in the distal PCA branch vessels bilaterally. Electronically signed by: Lonni Necessary MD 07/18/2024 06:20 AM EDT RP Workstation: [id number]   MR BRAIN WO CONTRAST Result Date: 07/17/2024 CLINICAL DATA:  Mental status change, unknown cause EXAM: MRI HEAD WITHOUT CONTRAST TECHNIQUE: Multiplanar, multiecho pulse sequences of the brain and surrounding structures were obtained without intravenous contrast. COMPARISON:  MRI October 31, 2023. FINDINGS: Brain: Acute posterior left MCA territory infarcts predominantly involving the posterior insula and overlying left parietal lobe. Mild associated edema without mass effect. No midline shift. No evidence of acute mass occupying hemorrhage, mass lesion or hydrocephalus. Mildly motion limited study. Vascular: Major arterial flow voids are maintained the skull base on motion limited assessment. Skull and upper cervical spine: Normal marrow signal. Sinuses/Orbits: Negative. Other: No mastoid effusions. IMPRESSION: Acute posterior left MCA territory infarcts predominantly involving the posterior insula and overlying left parietal lobe. These results  will be called to the ordering clinician or representative by the Radiologist Assistant, and communication documented in the PACS or [employer]. Electronically Signed   By:  Gilmore GORMAN Molt M.D.   On: 07/17/2024 19:28    Vitals:   07/17/24 2013 07/18/24 0457 07/18/24 0827 07/18/24 0954  BP: (!) 154/79 (!) 163/87 137/80 (!) 153/71  Pulse: 81 70 83 79  Resp: 17 18 19 18   Temp: 98 F (36.7 C) 98.5 F (36.9 C) 98.6 F (37 C)   TempSrc: Oral Oral Oral   SpO2: 99% 99% 100% 100%  Weight:      Height:         PHYSICAL EXAM General:  Alert, well-nourished, well-developed patient in no acute distress Psych: Mood labile, tearful at times CV: Regular rate and rhythm on monitor Respiratory:  Regular, unlabored respirations on room air   NEURO:  Mental Status: AA&Ox3, patient is able to give clear and coherent history Speech/Language: speech is with mild dysarthria (edentulous) or aphasia.    Cranial Nerves:  II: PERRL. Visual fields full.  III, IV, VI: EOMI. Eyelids elevate symmetrically.  V: Sensation is intact to light touch and symmetrical to face.  VII: Face is symmetrical resting and smiling VIII: hearing intact to voice. IX, X: Voice is mildly dysarthric KP:Dynloizm shrug 5/5. XII: tongue is midline without fasciculations. Motor: Able to move all 4 extremities with normal antigravity strength Tone: is normal and bulk is normal Sensation- Intact to light touch bilaterally. Extinction absent to light touch to DSS.   Coordination: FTN intact bilaterally, HKS: Unable to perform due to bilateral BKA's Gait- deferred  Most Recent NIH  1a Level of Conscious.: 0 1b LOC Questions: 0 1c LOC Commands: 0 2 Best Gaze: 0 3 Visual: 0 4 Facial Palsy: 0 5a Motor Arm - left: 0 5b Motor Arm - Right: 0 6a Motor Leg - Left: 0 6b Motor Leg - Right: 0 7 Limb Ataxia: 0 8 Sensory: 0 9 Best Language: 0 10 Dysarthria: 1 11 Extinct. and Inatten.: 0 TOTAL: 1   ASSESSMENT/PLAN  Ms. LUV MISH is a 63 y.o. female with history of CHF, CKD, diabetes, bilateral BKA, hypertension and hyperlipidemia who originally presented with several days of confusion  and was found to have left MCA territory stroke.  NIH on Admission 2  Acute Ischemic Infarct:  left patchy MCA territory infarcts  Etiology: Possibly embolic CT head No acute abnormality.  MRI acute posterior left MCA territory infarcts involving the posterior insula and overlying left parietal lobe MRA mild atherosclerotic changes in cavernous ICAs without significant stenosis, mild attenuation of MCA branches vessels, worst on the left, moderate narrowing of distal PCA branch vessels bilaterally Carotid Doppler pending 2D Echo pending LDL 36 HgbA1c 9.8 VTE prophylaxis -Lovenox  No antithrombotic prior to admission, now on aspirin  81 mg daily and clopidogrel  75 mg daily for 3 weeks and then aspirin  alone. Therapy recommendations:  Home Health PT and Home Health OT Disposition: Pending  Hypertension Home meds: Spironolactone  50 mg daily, amlodipine  10 mg daily Stable Blood Pressure Goal: BP less than 220/110   Hyperlipidemia Home meds: Rosuvastatin  20 mg daily, resumed in hospital LDL 36, goal < 70 Continue statin at discharge  Diabetes type II poorly controlled Home meds: Tresiba  insulin  65 units daily, metformin  500 mg daily, semaglutide  1 mg weekly HgbA1c 9.8, goal < 7.0 CBGs SSI Recommend close follow-up with PCP for better DM control  Tobacco Abuse Patient smokes 0.5 packs per day for  53 years      Ready to quit? N/A Nicotine  replacement therapy provided  Substance Abuse Patient uses marijuana UDS positive for  THC       Ready to quit? N/A TOC consult for cessation placed  Dysphagia Patient has post-stroke dysphagia, SLP consulted    Diet   Diet regular Room service appropriate? Yes; Fluid consistency: Thin   Advance diet as tolerated  Other Stroke Risk Factors Obesity, Body mass index is 40.92 kg/m., BMI >/= 30 associated with increased stroke risk, recommend weight loss, diet and exercise as appropriate    Other Active Problems Severe headache-headache  cocktail with 25 mg Benadryl , 10 mg Compazine  and 15 mg Toradol  given, also start Depakote  500 mg daily  Hospital day # 3  Patient seen by NP with MD, MD to edit note as needed. Cortney E Everitt Clint Kill , MSN, AGACNP-BC [facility] See Amion for schedule and pager information 07/18/2024 10:37 AM   I have personally obtained history,examined this patient, reviewed notes, independently viewed imaging studies, participated in medical decision making and plan of care.ROS completed by me personally and pertinent positives fully documented  I have made any additions or clarifications directly to the above note. Agree with note above.  Patient presented with several day history of headache and confusion and CT and MRI confirmed left parietal embolic infarct.  Recommend continue ongoing stroke workup with second carotid ultrasound, echocardiogram, cardiac monitoring.  Aspirin  and Plavix  for 3 weeks followed by aspirin  alone and aggressive risk factor modification.  She will likely need outpatient 30-day heart monitor for paroxysmal A-fib.  Treat headache symptomatically with migraine cocktail and start Depakote  ER 500 mg daily as she seems to be having significant underlying depression also.  Discussed with Dr. Ronnald Sergeant and internal medicine teaching team   I personally spent a total of 50 minutes in the care of the patient today including getting/reviewing separately obtained history, performing a medically appropriate exam/evaluation, counseling and educating, placing orders, referring and communicating with other health care professionals, documenting clinical information in the EHR, independently interpreting results, and coordinating care.        Eather Popp, MD Medical Director [facility] Pager: [phone] 07/18/2024 2:56 PM   To contact Stroke Continuity provider, please refer to [email or web address]. After hours, contact General Neurology

## 2024-07-18 NOTE — Progress Notes (Signed)
 RN re-attempted to bladder scan with oncoming NT. Patient refused VS, bladder scan and told staff to turn the lights off. RN verbalize understanding.

## 2024-07-18 NOTE — Progress Notes (Signed)
 Carotid exam is completed. Kleo Dungee, RVT

## 2024-07-18 NOTE — Care Management Important Message (Signed)
 Important Message  Patient Details  Name: Jennifer [name] MRN: [id number] Date of Birth: 1961-11-07   Important Message Given:        Glade Cuff 07/18/2024, 3:55 PM

## 2024-07-18 NOTE — Plan of Care (Signed)
   Problem: Fluid Volume: Goal: Ability to maintain a balanced intake and output will improve Outcome: Progressing   Problem: Health Behavior/Discharge Planning: Goal: Ability to manage health-related needs will improve Outcome: Progressing   Problem: Metabolic: Goal: Ability to maintain appropriate glucose levels will improve Outcome: Progressing

## 2024-07-18 NOTE — Progress Notes (Signed)
 Echocardiogram 2D Echocardiogram has been performed.  Jennifer [name] 07/18/2024, 12:11 PM

## 2024-07-18 NOTE — Progress Notes (Signed)
 RN reattempt bladder scan. Patient says she wants to rest and to try again later.
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
## 2024-07-18 NOTE — Progress Notes (Signed)
 Patient removed PIV. RN attempted to assess arm to place PIV and patient asked for a good person to place. Patient request for the team to place it to be sure. RN placed IV team consult in order to give IV toradol .

## 2024-07-18 NOTE — Progress Notes (Signed)
 Patient notified RN that patient is still having chronic pain with no relief from tylenol . Patient states she takes 5 mg oxycodone  at home.MD notified. See new orders.

## 2024-07-18 NOTE — Progress Notes (Signed)
 RN rounded and asked patient if RN can bladder scan patient. Patient responded come back later.

## 2024-07-18 NOTE — Progress Notes (Signed)
    [facility] has been requested to perform a transesophageal echocardiogram on Jennifer [name] for stroke.     The patient does NOT have any absolute or relative contraindications to a Transesophageal Echocardiogram (TEE).  The patient has: No other conditions that may impact this procedure.    After careful review of history and examination, the risks and benefits of transesophageal echocardiogram have been explained including risks of esophageal damage, perforation (1:10,000 risk), bleeding, pharyngeal hematoma as well as other potential complications associated with conscious sedation including aspiration, arrhythmia, respiratory failure and death. Alternatives to treatment were discussed, questions were answered. Patient is willing to proceed.   Signed, Manuelita Rummer, NP  07/18/2024 12:36 PM

## 2024-07-18 NOTE — Progress Notes (Signed)
 30d monitor ordered for stroke.

## 2024-07-18 NOTE — Progress Notes (Signed)
 Occupational Therapy Treatment Patient Details Name: Jennifer [name] MRN: [id number] DOB: 16-Dec-1961 Today's Date: 07/18/2024   History of present illness Pt is a 63 y/o F admitted on 07/14/24 after presenting with c/o AMS. MRI + L MCA territory infarct. 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, PAD, chronic back pain, depression, GERD, CKD2, urinary incontinence, gastroparesis   OT comments  Pt interacting appropriately, following commands with increased time. Reports she feels she is still not back to her baseline. Chief complaint of chronic R shoulder pain. Demonstrated ability to transfer back to bed with supervision using A-P technique. Session limited by arrival of ultrasound technician. Will continue to follow.       If plan is discharge home, recommend the following:  A little help with walking and/or transfers;A lot of help with bathing/dressing/bathroom;Assistance with cooking/housework;Direct supervision/assist for medications management;Direct supervision/assist for financial management;Assist for transportation;Help with stairs or ramp for entrance   Equipment Recommendations  None recommended by OT    Recommendations for Other Services      Precautions / Restrictions Precautions Precautions: Fall Precaution/Restrictions Comments: BLE amputations Restrictions Weight Bearing Restrictions Per Provider Order: No       Mobility Bed Mobility           Sit to supine: Independent   General bed mobility comments: independently returned to supine, for doppler    Transfers Overall transfer level: Needs assistance Equipment used: None Transfers: Bed to chair/wheelchair/BSC         Anterior-Posterior transfers: Supervision   General transfer comment: A-P back to bed     Balance Overall balance assessment: Needs assistance   Sitting balance-Leahy Scale: Good                                     ADL either performed or assessed with clinical  judgement   ADL                                              Extremity/Trunk Assessment Upper Extremity Assessment Upper Extremity Assessment: RUE deficits/detail RUE Deficits / Details: longstanding painful shoulder, reports rotator cuff tear            Vision       Perception     Praxis     Communication Communication Communication: No apparent difficulties;Impaired Factors Affecting Communication: Difficulty expressing self   Cognition Arousal: Alert Behavior During Therapy: WFL for tasks assessed/performed Cognition: Cognition impaired             OT - Cognition Comments: Pt following commands well and conversing appropriately. Unable to tell me who will care for her when she returns home. Pt is aware she is not at her baseline in cognition.                 Following commands: Intact Following commands impaired: Follows one step commands with increased time      Cueing   Cueing Techniques: Verbal cues  Exercises      Shoulder Instructions       General Comments Pt seemed to enjoy talking about her previous rehab experiences; Hopes her son comes to visit soon    Pertinent Vitals/ Pain       Pain Assessment Pain Assessment: Faces Faces Pain Scale: Hurts even more  Pain Location: R shoulder, B LEs Pain Descriptors / Indicators: Discomfort, Grimacing, Guarding Pain Intervention(s): Monitored during session, RN gave pain meds during session  Home Living                                          Prior Functioning/Environment              Frequency  Min 2X/week        Progress Toward Goals  OT Goals(current goals can now be found in the care plan section)  Progress towards OT goals: Progressing toward goals  Acute Rehab OT Goals OT Goal Formulation: With patient Time For Goal Achievement: 07/30/24 Potential to Achieve Goals: Good  Plan      Co-evaluation                 AM-PAC OT 6  Clicks Daily Activity     Outcome Measure   Help from another person eating meals?: None Help from another person taking care of personal grooming?: A Little Help from another person toileting, which includes using toliet, bedpan, or urinal?: A Lot Help from another person bathing (including washing, rinsing, drying)?: A Lot Help from another person to put on and taking off regular upper body clothing?: A Little Help from another person to put on and taking off regular lower body clothing?: A Little 6 Click Score: 17    End of Session    OT Visit Diagnosis: Muscle weakness (generalized) (M62.81);Pain;Other symptoms and signs involving cognitive function   Activity Tolerance Patient tolerated treatment well   Patient Left in bed;with call bell/phone within reach;with bed alarm set;Other (comment) (doppler tech in room)   Nurse Communication          Time: [phone] OT Time Calculation (min): 10 min  Charges: OT General Charges $OT Visit: 1 Visit OT Treatments $Therapeutic Activity: 8-22 mins  Jennifer [name], OTR/L Acute Rehabilitation Services Office: [phone]   Jennifer [name] 07/18/2024, 3:53 PM

## 2024-07-18 NOTE — Progress Notes (Signed)
 Physical Therapy Treatment Patient Details Name: Jennifer [name] MRN: [id number] DOB: 1961/08/09 Today's Date: 07/18/2024   History of Present Illness Pt is a 63 y/o F admitted on 07/14/24 after presenting with c/o AMS. Pt is being treated for encephalopathy. 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, PAD, chronic back pain, depression, GERD, CKD2, urinary incontinence, gastroparesis    PT Comments  Continuing work on functional mobility and activity tolerance;  Much better mentation today, and pt was ready and willing to transfer OOB to the recliner; She performed a very nice anterior-posterior transfer to the recliner locked facing the bed; showed efficient reciprocal scooting of hips, and used the armrests of the recliner as well to help with unweighing hips for scooting; anticipate she can likely get to home using this technqiue for transfers; worth considering having her use a drop-arm BSC for lateral scooting or a regular BSC (positioned for ant-post transfers) for toileting tasks while here inpatient   If plan is discharge home, recommend the following: A little help with walking and/or transfers;Assistance with cooking/housework;Assist for transportation;Help with stairs or ramp for entrance;Supervision due to cognitive status   Can travel by private vehicle      (Medical fleeta transport is a viable option)  Geophysical data processor (measurements PT);Wheelchair cushion (measurements PT);Other (comment) (May already have a manual WC; reports her power chair isn't working)    Recommendations for Other Services       Precautions / Restrictions Precautions Precautions: Fall Precaution/Restrictions Comments: BLE amputations Restrictions Weight Bearing Restrictions Per Provider Order: No     Mobility  Bed Mobility Overal bed mobility: Needs Assistance Bed Mobility: Supine to Sit     Supine to sit: Supervision     General bed mobility comments: Pulls to long sit with  rails    Transfers Overall transfer level: Needs assistance Equipment used: None Transfers: Bed to chair/wheelchair/BSC         Anterior-Posterior transfers: Supervision (this PT setup for safe ant-post transfer)   General transfer comment: transferred bed to recliner; Supervision for safety    Ambulation/Gait                   Stairs             Wheelchair Mobility     Tilt Bed    Modified Rankin (Stroke Patients Only)       Balance     Sitting balance-Leahy Scale: Good                                      Communication Communication Communication: No apparent difficulties;Impaired Factors Affecting Communication: Difficulty expressing self  Cognition Arousal: Alert Behavior During Therapy: WFL for tasks assessed/performed   PT - Cognitive impairments: No family/caregiver present to determine baseline                       PT - Cognition Comments: Much improved cognition today Following commands: Intact      Cueing Cueing Techniques: Verbal cues, Gestural cues  Exercises      General Comments General comments (skin integrity, edema, etc.): Pt seemed to enjoy talking about her previous rehab experiences; Hopes her son comes to visit soon      Pertinent Vitals/Pain Pain Assessment Pain Assessment: Faces (also states she gets phantom pains bilaterally at times) Faces Pain Scale: Hurts little more Pain Location:  R shoulder Pain Intervention(s): Monitored during session, Repositioned    Home Living                          Prior Function            PT Goals (current goals can now be found in the care plan section) Acute Rehab PT Goals Patient Stated Goal: Did not specifically state, but pt seemed pleased to be OOB PT Goal Formulation: Patient unable to participate in goal setting Time For Goal Achievement: 07/29/24 Potential to Achieve Goals: Good Progress towards PT goals: Progressing toward  goals    Frequency    Min 2X/week      PT Plan      Co-evaluation              AM-PAC PT 6 Clicks Mobility   Outcome Measure  Help needed turning from your back to your side while in a flat bed without using bedrails?: None Help needed moving from lying on your back to sitting on the side of a flat bed without using bedrails?: None Help needed moving to and from a bed to a chair (including a wheelchair)?: A Little Help needed standing up from a chair using your arms (e.g., wheelchair or bedside chair)?: Total Help needed to walk in hospital room?: Total Help needed climbing 3-5 steps with a railing? : Total 6 Click Score: 14    End of Session   Activity Tolerance: Patient tolerated treatment well Patient left: in chair;with call bell/phone within reach;with chair alarm set Nurse Communication: Mobility status PT Visit Diagnosis: Other abnormalities of gait and mobility (R26.89)     Time: 8693-8669 PT Time Calculation (min) (ACUTE ONLY): 24 min  Charges:    $Therapeutic Activity: 23-37 mins PT General Charges $$ ACUTE PT VISIT: 1 Visit                     Silvano Currier, PT  Acute Rehabilitation Services Office [phone] Secure Chat welcomed    [name] 07/18/2024, 2:47 PM

## 2024-07-18 NOTE — Progress Notes (Addendum)
 HD#3 SUBJECTIVE:  Patient Summary: Jennifer [name] is a 63 y.o. F living with a history of COPD, HLD, DM who presented with confusion and admitted for encephalopathy.  Overnight Events: MRI brain showing acute posterior left MCA territory infarct, predominantly involving the posterior insula and overlying left parietal lobe. No focal deficits and CN II-XII intact per night team. Neurology consulted and recommends embolic workup including echo, vascular imaging, A1c, and LDL. Started on Aspirin  and Plavix .   Interim History: Patient evaluated at bedside with sitter present. Patient reports that she doesn't feel too great today and reports decreased appetite. Reports a new headache localized to the left side of her head. Does not have headaches normally. Leg pain reported today is about the same as yesterday. She denies SOB. Oriented x3 to person, place and time, and she is able to recall why she came to the hospital. She states that today she continues to feel a bit confused compared to her baseline.    OBJECTIVE:  Vital Signs: Vitals:   07/17/24 1106 07/17/24 1613 07/17/24 2013 07/18/24 0457  BP: (!) 148/71 (!) 142/79 (!) 154/79 (!) 163/87  Pulse: 73 84 81 70  Resp: 18 18 17 18   Temp: 98.7 F (37.1 C)  98 F (36.7 C) 98.5 F (36.9 C)  TempSrc: Oral  Oral Oral  SpO2: 96% 98% 99% 99%  Weight:      Height:       Supplemental O2: Room Air SpO2: 99 %  Filed Weights   07/14/24 1458 07/17/24 0400  Weight: 99 kg 88.8 kg     Intake/Output Summary (Last 24 hours) at 07/18/2024 0625 Last data filed at 07/18/2024 0500 Gross per 24 hour  Intake 1260 ml  Output 0 ml  Net 1260 ml   Net IO Since Admission: 2,593.79 mL [07/18/24 0625]  Physical Exam: Physical Exam Constitutional:      Appearance: Normal appearance.  Eyes:     Extraocular Movements: Extraocular movements intact.     Pupils: Pupils are equal, round, and reactive to light.  Cardiovascular:     Rate and Rhythm: Normal  rate and regular rhythm.     Pulses: Normal pulses.  Pulmonary:     Effort: Pulmonary effort is normal.     Breath sounds: Normal breath sounds.  Abdominal:     General: Bowel sounds are normal.     Palpations: Abdomen is soft.  Skin:    General: Skin is warm and dry.  Neurological:     General: No focal deficit present.     Mental Status: She is alert and oriented to person, place, and time.     Cranial Nerves: No cranial nerve deficit.     Sensory: Sensory deficit present.     Motor: Weakness present.     Comments: Decreased sensation to right side of face compared to the left. Strength 4/5 on bilateral arms. Bilateral grip strength 4/5. Gait assessment not performed due to bilateral BKA.     Patient Lines/Drains/Airways Status     Active Line/Drains/Airways     Name Placement date Placement time Site Days   Peripheral IV 07/17/24 22 G 1.75 Anterior;Right Forearm 07/17/24  1308  Forearm  1   Wound / Incision (Open or Dehisced) 11/07/20 (MASD) Moisture Associated Skin Damage Buttocks Right;Left;Bilateral MASD redness bilateral buttocks 11/07/20  1800  Buttocks  1349   Wound / Incision (Open or Dehisced) 03/25/24 Venous stasis ulcer Leg Right Full thickness R lower leg 03/25/24  0800  Leg  115   Wound / Incision (Open or Dehisced) 03/25/24 Other (Comment) Right R foot necrotic tissue 03/25/24  0800  --  115            Pertinent labs and imaging:      Latest Ref Rng & Units 07/18/2024    6:06 AM 07/17/2024   12:42 PM 07/16/2024    8:26 AM  CBC  WBC 4.0 - 10.5 K/uL 7.1  6.9  5.6   Hemoglobin 12.0 - 15.0 g/dL 89.5  88.6  88.6   Hematocrit 36.0 - 46.0 % 32.9  36.7  36.3   Platelets 150 - 400 K/uL 166  197  210        Latest Ref Rng & Units 07/18/2024    6:06 AM 07/17/2024    9:28 AM 07/16/2024    8:26 AM  CMP  Glucose 70 - 99 mg/dL 811  747  806   BUN 8 - 23 mg/dL <5  <5  5   Creatinine 0.44 - 1.00 mg/dL 9.12  9.00  9.27   Sodium 135 - 145 mmol/L 137  136  138    Potassium 3.5 - 5.1 mmol/L 4.0  4.5  3.8   Chloride 98 - 111 mmol/L 104  105  104   CO2 22 - 32 mmol/L 22  20  22    Calcium  8.9 - 10.3 mg/dL 8.9  9.0  8.7     MR ANGIO HEAD WO CONTRAST Result Date: 07/18/2024 EXAM: MR Angiography Head without intravenous Contrast. 07/18/2024 04:46:30 AM TECHNIQUE: Magnetic resonance angiography images of the head without intravenous contrast. Multiplanar 2D and 3D reformatted images are provided for review. COMPARISON: MR head without contrast 06/18/2027. CLINICAL HISTORY: Neuro deficit, acute, stroke suspected. FINDINGS: ANTERIOR CIRCULATION: Signal loss of the internal carotid arteries at the skull base bilaterally is likely artifactual. Mild atherosclerotic changes are present through the cavernous internal carotid arteries without significant stenosis. The MCA bifurcations are within normal limits bilaterally. Mild attenuation of distal branch vessels is worse on the left. No significant proximal stenosis or occlusion is present. POSTERIOR CIRCULATION: Moderate narrowing is present in the distal PCA branch vessels bilaterally. IMPRESSION: 1. Mild atherosclerotic changes in the cavernous internal carotid arteries without significant stenosis. 2. Mild attenuation of distal MCA  branch vessels, worse on the left. 3. Moderate narrowing in the distal PCA branch vessels bilaterally. Electronically signed by: Lonni Necessary MD 07/18/2024 06:20 AM EDT RP Workstation: [id number]   MR BRAIN WO CONTRAST Result Date: 07/17/2024 CLINICAL DATA:  Mental status change, unknown cause EXAM: MRI HEAD WITHOUT CONTRAST TECHNIQUE: Multiplanar, multiecho pulse sequences of the brain and surrounding structures were obtained without intravenous contrast. COMPARISON:  MRI October 31, 2023. FINDINGS: Brain: Acute posterior left MCA territory infarcts predominantly involving the posterior insula and overlying left parietal lobe. Mild associated edema without mass effect. No midline shift.  No evidence of acute mass occupying hemorrhage, mass lesion or hydrocephalus. Mildly motion limited study. Vascular: Major arterial flow voids are maintained the skull base on motion limited assessment. Skull and upper cervical spine: Normal marrow signal. Sinuses/Orbits: Negative. Other: No mastoid effusions. IMPRESSION: Acute posterior left MCA territory infarcts predominantly involving the posterior insula and overlying left parietal lobe. These results will be called to the ordering clinician or representative by the Radiologist Assistant, and communication documented in the PACS or [employer]. Electronically Signed   By: Gilmore GORMAN Molt M.D.   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   Plan: #Acute Left Posterior MCA Infarct #Encephalopathy #Delirium, resolved MRI brain shows acute posterior left MCA territory infarcts predominantly involving the posterior insula and overlying left parietal lobe. Dr. Michaela with Neurology consulted and saw the patient last night. Recommended embolic workup including echo, vascular carotid imaging, and modifiable risk factors like A1c and LDL. Recommends PT/OT/SLP. Started patient on ASA 81mg  and Plavix . Stroke team  recommends Depakote  500mg , headache cocktail, and will continue to follow. Patient on Lovenox  for VTE prophylaxis. Stroke team recommends 30 day cardiac monitor. MR Angio Head shows mild attenuation of distal MCA branch vessels, worse on the left. Moderate narrowing in the distal PCA branch vessels bilaterally. Mood is fluctuating. Has been labile since admission, but did not have much of an emotional reaction when told about her stroke.  - Start Depakote  500mg  daily - Headache cocktail, including Benadryl  25mg , Compazine  10mg , and Toradol  15mg . - Continue ASA and Plavix  - Pending echo and carotid dopplers - 30 day cardiac monitor - Follow up AM BMP and CBC - A1c  - Consult PT, OT, SLP - Continue 1:1 sitter observation with delirium precautions in place - Follow up 7/26 blood cultures: no growth - Urine culture positive for Klebsiella. Finish CTX (day 3/3) today.   #Major Depression in partial remission Was restarted on home duloxetine  60mg  daily. No overnight events. Will monitor patient's confusion and hold medication if worsened encephalopathy. - Continue home duloxetine  60mg .  #Hypertension BP mildly hypertensive 153/71.  - Continue home amlodipine  10mg , spironolactone  50mg  daily  #Polypharmacy Patient on many centrally-acting medications, including Baclofen , Robaxin , Myrbetriq , Oxybutynin , Trazodone , and Oxycodone . These could have changed when leaving nursing facility. May be affecting mental state on top of acute stroke. Needs medication reconciliation during this stay. Patient's home duloxetine  and pregabalin  started yesterday. Will continue to monitor throughout this hospitalization.   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 Patient yesterday reported bilateral amputation site pain. Home oxycodone  was held and patient had no overnight withdrawal effects.  - Continue pregabalin  25mg  TID - Continue to monitor for possible  withdrawal  #Urinary Incontinence Patient prescribed Myrbetriq  and Oxybutynin . Will continue to hold these medications. Bladder currently not retaining. Output this morning . -Continue Q8H scan. If retaining > , perform in-and-out cath. -Finish CTX, day 3/3 as stated above.  #Decreased TSH Mildly decreased TSH, free T4 mildly elevated. Less suspicious this is contributing to patient's presentation. Can follow up outpatient.   #Type 2 Diabetes Mellitus A1c 9.8. Fasting CBG 188 today. Home meds include Tresiba  65 units daily, Metformin  500mg  daily, and Semaglutide  1mg  weekly. Appetite has not improved. Patient restarted on 15 units of long acting insulin  yesterday. -CBG monitoring  -Novolog  moderate SSI -Semglee  20 units daily  #Hyperlipidemia LDL  36. On home rosuvastatin  20mg . - Continue home rosuvastatin  at discharge per stroke team  #Dysphagia Patient has post-stroke dysphagia. SLP consulted and recommends continued ST services throughout hospitalization.  - Start thin diet. Advance diet as tolerated.  #Hypomagnesemia Magnesium  yesterday low 1.6 and replenished with mag sulfate 2g. Mag today 1.8.  Stable Chronic Conditions: GERD- continue home pantoprazole  40mg  CKD Stage 2 due to Type 2 DM - stable/at baseline, monitor BMP for renal function PAD with history of revascularization -  continue home rosuvastatin  20mg  at discharge COPD- lung exam stable, continue home anora ellipta Gastroparesis, likely secondary to diabetes R Renal Lesion - 5cm mass lesion in the midpole of the right kidney. Will need to follow up with outpatient MRI.   Best Practice: Diet: Regular diet IVF: Fluids: none VTE: enoxaparin  (LOVENOX ) injection 40 mg Start: 07/14/24 2030 Code: Full  Disposition planning: Therapy Recs: Home Health PT,OT, SLP Family Contact: son DISPO: Anticipated discharge pending clinical improvement.  Signature:  Tassie Pollett [facility]  Residency  6:25 AM, 07/18/2024  On Call pager [phone]

## 2024-07-19 ENCOUNTER — Inpatient Hospital Stay (HOSPITAL_COMMUNITY)

## 2024-07-19 ENCOUNTER — Other Ambulatory Visit: Payer: Self-pay | Admitting: Internal Medicine

## 2024-07-19 ENCOUNTER — Encounter (HOSPITAL_COMMUNITY)
Admission: EM | Disposition: A | Discharge status: Home Health | Payer: Self-pay | Source: Home / Self Care | Attending: Internal Medicine

## 2024-07-19 ENCOUNTER — Other Ambulatory Visit (HOSPITAL_COMMUNITY): Payer: Self-pay

## 2024-07-19 ENCOUNTER — Other Ambulatory Visit: Payer: Self-pay

## 2024-07-19 DIAGNOSIS — I69391 Dysphagia following cerebral infarction: Secondary | ICD-10-CM | POA: Diagnosis not present

## 2024-07-19 DIAGNOSIS — I639 Cerebral infarction, unspecified: Secondary | ICD-10-CM

## 2024-07-19 DIAGNOSIS — I5032 Chronic diastolic (congestive) heart failure: Secondary | ICD-10-CM

## 2024-07-19 DIAGNOSIS — I130 Hypertensive heart and chronic kidney disease with heart failure and stage 1 through stage 4 chronic kidney disease, or unspecified chronic kidney disease: Secondary | ICD-10-CM

## 2024-07-19 DIAGNOSIS — I63412 Cerebral infarction due to embolism of left middle cerebral artery: Secondary | ICD-10-CM | POA: Diagnosis not present

## 2024-07-19 DIAGNOSIS — R29700 NIHSS score 0: Secondary | ICD-10-CM | POA: Diagnosis not present

## 2024-07-19 DIAGNOSIS — Q2112 Patent foramen ovale: Secondary | ICD-10-CM

## 2024-07-19 DIAGNOSIS — R7881 Bacteremia: Secondary | ICD-10-CM

## 2024-07-19 DIAGNOSIS — F17210 Nicotine dependence, cigarettes, uncomplicated: Secondary | ICD-10-CM | POA: Diagnosis not present

## 2024-07-19 DIAGNOSIS — N182 Chronic kidney disease, stage 2 (mild): Secondary | ICD-10-CM

## 2024-07-19 HISTORY — PX: TRANSESOPHAGEAL ECHOCARDIOGRAM (CATH LAB): EP1270

## 2024-07-19 LAB — CBC
HCT: 35.2 % — ABNORMAL LOW (ref 36.0–46.0)
Hemoglobin: 10.7 g/dL — ABNORMAL LOW (ref 12.0–15.0)
MCH: 25.5 pg — ABNORMAL LOW (ref 26.0–34.0)
MCHC: 30.4 g/dL (ref 30.0–36.0)
MCV: 83.8 fL (ref 80.0–100.0)
Platelets: 184 K/uL (ref 150–400)
RBC: 4.2 MIL/uL (ref 3.87–5.11)
RDW: 19.4 % — ABNORMAL HIGH (ref 11.5–15.5)
WBC: 6.6 K/uL (ref 4.0–10.5)
nRBC: 0 % (ref 0.0–0.2)

## 2024-07-19 LAB — MAGNESIUM: Magnesium: 1.8 mg/dL (ref 1.7–2.4)

## 2024-07-19 LAB — BASIC METABOLIC PANEL WITH GFR
Anion gap: 10 (ref 5–15)
BUN: 9 mg/dL (ref 8–23)
CO2: 23 mmol/L (ref 22–32)
Calcium: 9.1 mg/dL (ref 8.9–10.3)
Chloride: 104 mmol/L (ref 98–111)
Creatinine, Ser: 0.87 mg/dL (ref 0.44–1.00)
GFR, Estimated: >60 mL/min (ref >60)
Glucose, Bld: 231 mg/dL — ABNORMAL HIGH (ref 70–99)
Potassium: 4.9 mmol/L (ref 3.5–5.1)
Sodium: 137 mmol/L (ref 135–145)

## 2024-07-19 LAB — GLUCOSE, CAPILLARY
Glucose-Capillary: 228 mg/dL — ABNORMAL HIGH (ref 70–99)
Glucose-Capillary: 248 mg/dL — ABNORMAL HIGH (ref 70–99)
Glucose-Capillary: 221 mg/dL — ABNORMAL HIGH (ref 70–99)
Glucose-Capillary: 284 mg/dL — ABNORMAL HIGH (ref 70–99)

## 2024-07-19 LAB — ECHO TEE

## 2024-07-19 SURGERY — TRANSESOPHAGEAL ECHOCARDIOGRAM (TEE) (CATHLAB)
Anesthesia: Monitor Anesthesia Care

## 2024-07-19 MED ORDER — SODIUM CHLORIDE 0.9 % IV SOLN: INTRAVENOUS | Status: DC | PRN

## 2024-07-19 MED ORDER — SODIUM CHLORIDE 0.9 % IV SOLN: INTRAVENOUS | Status: DC

## 2024-07-19 MED ORDER — INSULIN GLARGINE-YFGN 100 UNIT/ML ~~LOC~~ SOLN
25.0000 [IU] | Freq: Every day | SUBCUTANEOUS | Status: DC
  Administered 2024-07-20: 25 [IU] via SUBCUTANEOUS
  Filled 2024-07-19: qty 0.25

## 2024-07-19 MED ORDER — LIDOCAINE 2% (20 MG/ML) 5 ML SYRINGE
INTRAMUSCULAR | Status: DC | PRN
  Administered 2024-07-19: 100 mg via INTRAVENOUS

## 2024-07-19 MED ORDER — PROPOFOL 10 MG/ML IV BOLUS
INTRAVENOUS | Status: DC | PRN
  Administered 2024-07-19: 60 mg via INTRAVENOUS
  Administered 2024-07-19 (×3): 20 mg via INTRAVENOUS

## 2024-07-19 NOTE — Interval H&P Note (Signed)
 History and Physical Interval Note:  07/19/2024 7:53 AM  Jennifer [name]  has presented today for surgery, with the diagnosis of bacteremia.  The various methods of treatment have been discussed with the patient and family. After consideration of risks, benefits and other options for treatment, the patient has consented to  Procedure(s): TRANSESOPHAGEAL ECHOCARDIOGRAM (N/A) as a surgical intervention.  The patient's history has been reviewed, patient examined, no change in status, stable for surgery.  I have reviewed the patient's chart and labs.  Questions were answered to the patient's satisfaction.     [name]

## 2024-07-19 NOTE — Anesthesia Preprocedure Evaluation (Addendum)
 Anesthesia Evaluation  Patient identified by MRN, date of birth, ID band Patient awake    Reviewed: Allergy & Precautions, NPO status , Patient's Chart, lab work & pertinent test results  History of Anesthesia Complications Negative for: history of anesthetic complications  Airway Mallampati: III  TM Distance: >3 FB Neck ROM: Full    Dental  (+) Dental Advisory Given, Edentulous Upper, Edentulous Lower   Pulmonary asthma , COPD, Current Smoker and Patient abstained from smoking.   breath sounds clear to auscultation       Cardiovascular hypertension, Pt. on medications and Pt. on home beta blockers (-) angina + Peripheral Vascular Disease and +CHF   Rhythm:Regular  1. Left ventricular ejection fraction, by estimation, is 65 to 70%. The  left ventricle has normal function. The left ventricle has no regional  wall motion abnormalities. There is mild left ventricular hypertrophy.  Left ventricular diastolic parameters  are consistent with Grade I diastolic dysfunction (impaired relaxation).   2. Right ventricular systolic function is normal. The right ventricular  size is normal. Tricuspid regurgitation signal is inadequate for assessing  PA pressure.   3. The mitral valve is grossly normal. Trivial mitral valve  regurgitation. No evidence of mitral stenosis.   4. The aortic valve is tricuspid. There is mild calcification of the  aortic valve. Aortic valve regurgitation is not visualized. No aortic  stenosis is present.   5. The inferior vena cava is dilated in size with <50% respiratory  variability, suggesting right atrial pressure of 15 mmHg.     Neuro/Psych  Headaches PSYCHIATRIC DISORDERS  Depression     Neuromuscular disease CVA, No Residual Symptoms    GI/Hepatic Neg liver ROS,GERD  ,,  Endo/Other  diabetes    Renal/GU CRFRenal disease     Musculoskeletal  (+) Arthritis ,  Fibromyalgia -  Abdominal   Peds   Hematology  (+) Blood dyscrasia, anemia Lab Results      Component                Value               Date                      WBC                      6.6                 07/19/2024                HGB                      10.7 (L)            07/19/2024                HCT                      35.2 (L)            07/19/2024                MCV                      83.8                07/19/2024                PLT  184                 07/19/2024              Anesthesia Other Findings   Reproductive/Obstetrics                              Anesthesia Physical Anesthesia Plan  ASA: 3  Anesthesia Plan: MAC   Post-op Pain Management: Minimal or no pain anticipated   Induction: Intravenous  PONV Risk Score and Plan: 1 and Propofol  infusion and Treatment may vary due to age or medical condition  Airway Management Planned: Nasal Cannula, Simple Face Mask and Natural Airway  Additional Equipment: None  Intra-op Plan:   Post-operative Plan:   Informed Consent: I have reviewed the patients History and Physical, chart, labs and discussed the procedure including the risks, benefits and alternatives for the proposed anesthesia with the patient or authorized representative who has indicated his/her understanding and acceptance.     Dental advisory given  Plan Discussed with: CRNA  Anesthesia Plan Comments:         Anesthesia Quick Evaluation

## 2024-07-19 NOTE — Progress Notes (Signed)
 Echocardiogram Echocardiogram Transesophageal has been performed.  Jennifer [name] 07/19/2024, 9:02 AM

## 2024-07-19 NOTE — Plan of Care (Signed)
 Problem: Education: Goal: Ability to describe self-care measures that may prevent or decrease complications (Diabetes Survival Skills Education) will improve Outcome: Progressing Goal: Individualized Educational Video(s) Outcome: Progressing   Problem: Coping: Goal: Ability to adjust to condition or change in health will improve Outcome: Progressing   Problem: Fluid Volume: Goal: Ability to maintain a balanced intake and output will improve Outcome: Progressing   Problem: Health Behavior/Discharge Planning: Goal: Ability to identify and utilize available resources and services will improve Outcome: Progressing Goal: Ability to manage health-related needs will improve Outcome: Progressing   Problem: Metabolic: Goal: Ability to maintain appropriate glucose levels will improve Outcome: Progressing   Problem: Nutritional: Goal: Maintenance of adequate nutrition will improve Outcome: Progressing Goal: Progress toward achieving an optimal weight will improve Outcome: Progressing   Problem: Skin Integrity: Goal: Risk for impaired skin integrity will decrease Outcome: Progressing   Problem: Tissue Perfusion: Goal: Adequacy of tissue perfusion will improve Outcome: Progressing   Problem: Education: Goal: Knowledge of General Education information will improve Description: Including pain rating scale, medication(s)/side effects and non-pharmacologic comfort measures Outcome: Progressing   Problem: Health Behavior/Discharge Planning: Goal: Ability to manage health-related needs will improve Outcome: Progressing   Problem: Clinical Measurements: Goal: Ability to maintain clinical measurements within normal limits will improve Outcome: Progressing Goal: Will remain free from infection Outcome: Progressing Goal: Diagnostic test results will improve Outcome: Progressing Goal: Respiratory complications will improve Outcome: Progressing Goal: Cardiovascular complication will  be avoided Outcome: Progressing   Problem: Activity: Goal: Risk for activity intolerance will decrease Outcome: Progressing   Problem: Nutrition: Goal: Adequate nutrition will be maintained Outcome: Progressing   Problem: Coping: Goal: Level of anxiety will decrease Outcome: Progressing   Problem: Elimination: Goal: Will not experience complications related to bowel motility Outcome: Progressing Goal: Will not experience complications related to urinary retention Outcome: Progressing   Problem: Pain Managment: Goal: General experience of comfort will improve and/or be controlled Outcome: Progressing   Problem: Safety: Goal: Ability to remain free from injury will improve Outcome: Progressing   Problem: Skin Integrity: Goal: Risk for impaired skin integrity will decrease Outcome: Progressing   Problem: Education: Goal: Knowledge of disease or condition will improve 07/19/2024 1639 by Johnie Will HERO, RN Outcome: Progressing 07/19/2024 1318 by Johnie Will HERO, RN Outcome: Progressing Goal: Knowledge of secondary prevention will improve (MUST DOCUMENT ALL) 07/19/2024 1639 by Johnie Will HERO, RN Outcome: Progressing 07/19/2024 1318 by Johnie Will HERO, RN Outcome: Progressing Goal: Knowledge of patient specific risk factors will improve (DELETE if not current risk factor) 07/19/2024 1639 by Johnie Will HERO, RN Outcome: Progressing 07/19/2024 1318 by Johnie Will HERO, RN Outcome: Progressing   Problem: Ischemic Stroke/TIA Tissue Perfusion: Goal: Complications of ischemic stroke/TIA will be minimized 07/19/2024 1639 by Johnie Will HERO, RN Outcome: Progressing 07/19/2024 1318 by Johnie Will HERO, RN Outcome: Progressing   Problem: Coping: Goal: Will verbalize positive feelings about self 07/19/2024 1639 by Johnie Will HERO, RN Outcome: Progressing 07/19/2024 1318 by Johnie Will HERO, RN Outcome: Progressing Goal: Will identify  appropriate support needs 07/19/2024 1639 by Johnie Will HERO, RN Outcome: Progressing 07/19/2024 1318 by Johnie Will HERO, RN Outcome: Progressing   Problem: Health Behavior/Discharge Planning: Goal: Ability to manage health-related needs will improve 07/19/2024 1639 by Johnie Will HERO, RN Outcome: Progressing 07/19/2024 1318 by Johnie Will HERO, RN Outcome: Progressing Goal: Goals will be collaboratively established with patient/family 07/19/2024 1639 by Johnie Will HERO, RN Outcome: Progressing 07/19/2024 1318 by Johnie Will HERO, RN Outcome: Progressing  Problem: Self-Care: Goal: Ability to participate in self-care as condition permits will improve 07/19/2024 1639 by Johnie Will HERO, RN Outcome: Progressing 07/19/2024 1318 by Johnie Will HERO, RN Outcome: Progressing Goal: Verbalization of feelings and concerns over difficulty with self-care will improve 07/19/2024 1639 by Johnie Will HERO, RN Outcome: Progressing 07/19/2024 1318 by Johnie Will HERO, RN Outcome: Progressing Goal: Ability to communicate needs accurately will improve 07/19/2024 1639 by Johnie Will HERO, RN Outcome: Progressing 07/19/2024 1318 by Johnie Will HERO, RN Outcome: Progressing   Problem: Nutrition: Goal: Risk of aspiration will decrease 07/19/2024 1639 by Johnie Will HERO, RN Outcome: Progressing 07/19/2024 1318 by Johnie Will HERO, RN Outcome: Progressing Goal: Dietary intake will improve 07/19/2024 1639 by Johnie Will HERO, RN Outcome: Progressing 07/19/2024 1318 by Johnie Will HERO, RN Outcome: Progressing

## 2024-07-19 NOTE — Plan of Care (Signed)
  Problem: Education: Goal: Ability to describe self-care measures that may prevent or decrease complications (Diabetes Survival Skills Education) will improve Outcome: Progressing Goal: Individualized Educational Video(s) Outcome: Progressing   Problem: Coping: Goal: Ability to adjust to condition or change in health will improve Outcome: Progressing   Problem: Fluid Volume: Goal: Ability to maintain a balanced intake and output will improve Outcome: Progressing   Problem: Health Behavior/Discharge Planning: Goal: Ability to identify and utilize available resources and services will improve Outcome: Progressing Goal: Ability to manage health-related needs will improve Outcome: Progressing   Problem: Metabolic: Goal: Ability to maintain appropriate glucose levels will improve Outcome: Progressing   Problem: Nutritional: Goal: Maintenance of adequate nutrition will improve Outcome: Progressing Goal: Progress toward achieving an optimal weight will improve Outcome: Progressing   Problem: Skin Integrity: Goal: Risk for impaired skin integrity will decrease Outcome: Progressing   Problem: Tissue Perfusion: Goal: Adequacy of tissue perfusion will improve Outcome: Progressing   Problem: Education: Goal: Knowledge of General Education information will improve Description: Including pain rating scale, medication(s)/side effects and non-pharmacologic comfort measures Outcome: Progressing   Problem: Health Behavior/Discharge Planning: Goal: Ability to manage health-related needs will improve Outcome: Progressing   Problem: Clinical Measurements: Goal: Ability to maintain clinical measurements within normal limits will improve Outcome: Progressing Goal: Will remain free from infection Outcome: Progressing Goal: Diagnostic test results will improve Outcome: Progressing Goal: Respiratory complications will improve Outcome: Progressing Goal: Cardiovascular complication will  be avoided Outcome: Progressing   Problem: Activity: Goal: Risk for activity intolerance will decrease Outcome: Progressing   Problem: Nutrition: Goal: Adequate nutrition will be maintained Outcome: Progressing   Problem: Coping: Goal: Level of anxiety will decrease Outcome: Progressing   Problem: Elimination: Goal: Will not experience complications related to bowel motility Outcome: Progressing Goal: Will not experience complications related to urinary retention Outcome: Progressing   Problem: Pain Managment: Goal: General experience of comfort will improve and/or be controlled Outcome: Progressing   Problem: Safety: Goal: Ability to remain free from injury will improve Outcome: Progressing   Problem: Skin Integrity: Goal: Risk for impaired skin integrity will decrease Outcome: Progressing   Problem: Education: Goal: Knowledge of disease or condition will improve Outcome: Progressing Goal: Knowledge of secondary prevention will improve (MUST DOCUMENT ALL) Outcome: Progressing Goal: Knowledge of patient specific risk factors will improve (DELETE if not current risk factor) Outcome: Progressing   Problem: Ischemic Stroke/TIA Tissue Perfusion: Goal: Complications of ischemic stroke/TIA will be minimized Outcome: Progressing   Problem: Coping: Goal: Will verbalize positive feelings about self Outcome: Progressing Goal: Will identify appropriate support needs Outcome: Progressing   Problem: Health Behavior/Discharge Planning: Goal: Ability to manage health-related needs will improve Outcome: Progressing Goal: Goals will be collaboratively established with patient/family Outcome: Progressing   Problem: Self-Care: Goal: Ability to participate in self-care as condition permits will improve Outcome: Progressing Goal: Verbalization of feelings and concerns over difficulty with self-care will improve Outcome: Progressing Goal: Ability to communicate needs  accurately will improve Outcome: Progressing   Problem: Nutrition: Goal: Risk of aspiration will decrease Outcome: Progressing Goal: Dietary intake will improve Outcome: Progressing

## 2024-07-19 NOTE — Anesthesia Postprocedure Evaluation (Signed)
 Anesthesia Post Note  Patient: Jennifer [name]  Procedure(s) Performed: TRANSESOPHAGEAL ECHOCARDIOGRAM     Patient location during evaluation: Cath Lab Anesthesia Type: MAC Level of consciousness: awake and alert Pain management: pain level controlled Vital Signs Assessment: post-procedure vital signs reviewed and stable Respiratory status: spontaneous breathing, nonlabored ventilation and respiratory function stable Cardiovascular status: stable and blood pressure returned to baseline Postop Assessment: no apparent nausea or vomiting Anesthetic complications: no   No notable events documented.                  Camora Tremain

## 2024-07-19 NOTE — Transfer of Care (Signed)
 Immediate Anesthesia Transfer of Care Note  Patient: Jennifer [name]  Procedure(s) Performed: TRANSESOPHAGEAL ECHOCARDIOGRAM  Patient Location: Cath Lab  Anesthesia Type:MAC  Level of Consciousness: awake, alert , and oriented  Airway & Oxygen  Therapy: Patient Spontanous Breathing and Patient connected to nasal cannula oxygen   Post-op Assessment: Report given to RN and Post -op Vital signs reviewed and stable  Post vital signs: Reviewed and stable  Last Vitals:  Vitals Value Taken Time  BP    Temp    Pulse    Resp    SpO2      Last Pain:  Vitals:   07/19/24 0750  TempSrc: Temporal  PainSc: 7          Complications: No notable events documented.

## 2024-07-19 NOTE — Care Management Important Message (Signed)
 Important Message  Patient Details  Name: Jennifer [name] MRN: [id number] Date of Birth: [birth year]   Important Message Given:  Yes - Medicare IM     Claretta Deed 07/19/2024, 10:45 AM

## 2024-07-19 NOTE — CV Procedure (Signed)
 TRANSESOPHAGEAL ECHOCARDIOGRAM (TEE) NOTE  INDICATIONS: Cryptogenic stroke  PROCEDURE:   Informed consent was obtained prior to the procedure. The risks, benefits and alternatives for the procedure were discussed and the patient comprehended these risks.  Risks include, but are not limited to, cough, sore throat, vomiting, nausea, somnolence, esophageal and stomach trauma or perforation, bleeding, low blood pressure, aspiration, pneumonia, infection, trauma to the teeth and death.    After a procedural time-out, the patient was given propofol  for sedation by anesthesia. See their separate report.  The patient's heart rate, blood pressure, and oxygen  saturation are monitored continuously during the procedure.The oropharynx was anesthetized with topical cetacaine.  The transesophageal probe was inserted in the esophagus and stomach without difficulty and multiple views were obtained.  The patient was kept under observation until the patient left the procedure room.  I was present face-to-face 100% of this time. The patient left the procedure room in stable condition.   Agitated microbubble saline contrast was administered.  COMPLICATIONS:    There were no immediate complications.  Findings:  LEFT VENTRICLE: The left ventricular wall thickness is mildly increased.  The left ventricular cavity is normal in size. Wall motion is normal.  LVEF is 65-70%.  RIGHT VENTRICLE:  The right ventricle is normal in structure and function without any thrombus or masses.    LEFT ATRIUM:  The left atrium is normal in size without any thrombus or masses.  There is not spontaneous echo contrast (smoke) in the left atrium consistent with a low flow state.  LEFT ATRIAL APPENDAGE:  The left atrial appendage is free of any thrombus or masses. The appendage has single lobes. Pulse doppler indicates moderate flow in the appendage.  ATRIAL SEPTUM:  The atrial septum is aneurysmal.  There is evidence for a  small amount of right to left interatrial shunting by saline microbubble with valsalva suggestive of small PFO. No left to right shunting was noted.   RIGHT ATRIUM:  The right atrium is normal in size and function without any thrombus or masses. There is a prominent eustachian valve which directs IVC flow towards the atrial septum.  MITRAL VALVE:  The mitral valve is normal in structure and function with trivial regurgitation.  There were no vegetations or stenosis.  AORTIC VALVE:  The aortic valve is trileaflet, normal in structure and function with no regurgitation.  There were no vegetations or stenosis  TRICUSPID VALVE:  The tricuspid valve is normal in structure and function with trivial regurgitation.  There were no vegetations or stenosis   PULMONIC VALVE:  The pulmonic valve is normal in structure and function with no regurgitation.  There were no vegetations or stenosis.   AORTIC ARCH, ASCENDING AND DESCENDING AORTA:  There was grade 1 Shaune et. Al, 1992) atherosclerosis of the ascending aorta, aortic arch, or proximal descending aorta.  12. PULMONARY VEINS: Anomalous pulmonary venous return was not noted.  13. PERICARDIUM: The pericardium appeared normal and non-thickened.  There is no pericardial effusion.  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 No LAA thrombus No signficicant valve disease Mild aortic atherosclerosis Mild LVH LVEF 65-70% with normal wall motion  RECOMMENDATIONS:    Consider structural heart team evaluation of atrial septal aneurysm and small PFO for possible closure in the setting of stroke  Time Spent Directly with the Patient:  45 minutes   Vinie KYM Maxcy, MD,  FACC, FNLA, FACP  [place]  [facility]  Medical Director of the [facility] Diplomate of the American Board of Clinical  Lipidology Attending Cardiologist  Direct Dial : [phone]  Fax: [phone]  Website:  [email or web address] Vinie JAYSON Mona 07/19/2024, 8:47 AM

## 2024-07-19 NOTE — TOC Progression Note (Signed)
 Transition of Care [facility]) - Progression Note    Patient Details  Name: Jennifer [name] MRN: [id number] Date of Birth: May 23, 1961  [facility] CM/SW Contact  Tom-Johnson, Harvest Muskrat, RN Phone Number: 07/19/2024, 11:16 AM  Clinical Narrative:     Patient underwent TEE today 07/19/24 by Cardiology. Plan to consider structural Heart Team evaluation of Atrial Septal Aneurysm and small PFO for possible closure in the setting of Stroke. Patient on Plavix  and Crestor , completed IV abx.  Home health recommended, patient requested [facility] as she was active with their services last year. Referral called in to [facility] with acceptance noted, info on AVS.     Patient not Medically ready for discharge.  CM will continue to follow as patient progresses with care towards discharge.         Expected Discharge Plan: Home/Self Care Barriers to Discharge: Continued Medical Work up               Expected Discharge Plan and Services   Discharge Planning Services: CM Consult   Living arrangements for the past 2 months: Single Family Home                                       Social Drivers of Health (SDOH) Interventions SDOH Screenings   Food Insecurity: Patient Unable To Answer (07/15/2024)  Housing: Patient Unable To Answer (07/15/2024)  Transportation Needs: Patient Unable To Answer (07/15/2024)  Utilities: Patient Unable To Answer (07/15/2024)  Alcohol Screen: Low Risk  (03/31/2023)  Depression (PHQ2-9): High Risk (10/08/2023)  Financial Resource Strain: High Risk (03/31/2023)  Physical Activity: Inactive (03/31/2023)  Social Connections: Patient Unable To Answer (07/15/2024)  Stress: Stress Concern Present (03/31/2023)  Tobacco Use: High Risk (07/15/2024)    Readmission Risk Interventions    04/07/2024   12:00 PM 03/29/2024    1:06 PM 10/27/2021   10:18 AM  Readmission Risk Prevention Plan  Transportation Screening Complete Complete Complete  PCP or Specialist Appt  within 3-5 Days   Complete  HRI or Home Care Consult  Complete Complete  Social Work Consult for Recovery Care Planning/Counseling  Complete Complete  Palliative Care Screening  Not Applicable Not Applicable  Medication Review (RN Care Manager) Referral to Pharmacy Referral to Pharmacy Complete  PCP or Specialist appointment within 3-5 days of discharge Complete    HRI or Home Care Consult Complete    SW Recovery Care/Counseling Consult Complete

## 2024-07-19 NOTE — Progress Notes (Signed)
 PT Cancellation Note  Patient Details Name: Jennifer [name] MRN: [id number] DOB: 1961/03/24   Cancelled Treatment:    Reason Eval/Treat Not Completed: Patient at procedure or test/unavailable  Will follow up later today as time allows;  Otherwise, will follow up for PT tomorrow;   Thank you,  Silvano Currier, PT  Acute Rehabilitation Services Office [phone]    [name] 07/19/2024, 2:14 PM

## 2024-07-19 NOTE — Progress Notes (Signed)
 STROKE TEAM PROGRESS NOTE    INTERIM HISTORY/SUBJECTIVE Patient just returned from TEE which shows no cardiac clot but a small PFO.  She has a ROPeEscore of 3 with 0% chance that it is due to PFO Patient remains hemodynamically stable and afebrile.  She improvement in her headache OBJECTIVE  CBC    Component Value Date/Time   WBC 6.6 07/19/2024 0502   RBC 4.20 07/19/2024 0502   HGB 10.7 (L) 07/19/2024 0502   HGB 12.2 09/01/2022 1000   HCT 35.2 (L) 07/19/2024 0502   HCT 38.5 09/01/2022 1000   PLT 184 07/19/2024 0502   PLT 175 09/01/2022 1000   MCV 83.8 07/19/2024 0502   MCV 93 09/01/2022 1000   MCH 25.5 (L) 07/19/2024 0502   MCHC 30.4 07/19/2024 0502   RDW 19.4 (H) 07/19/2024 0502   RDW 14.4 09/01/2022 1000   LYMPHSABS 3.6 07/14/2024 1600   LYMPHSABS 2.7 09/01/2022 1000   MONOABS 0.6 07/14/2024 1600   EOSABS 0.0 07/14/2024 1600   EOSABS 0.1 09/01/2022 1000   BASOSABS 0.0 07/14/2024 1600   BASOSABS 0.1 09/01/2022 1000    BMET    Component Value Date/Time   NA 137 07/19/2024 0502   NA 141 02/18/2024 0932   K 4.9 07/19/2024 0502   CL 104 07/19/2024 0502   CO2 23 07/19/2024 0502   GLUCOSE 231 (H) 07/19/2024 0502   BUN 9 07/19/2024 0502   BUN 10 02/18/2024 0932   CREATININE 0.87 07/19/2024 0502   CREATININE 0.68 01/31/2015 1641   CALCIUM  9.1 07/19/2024 0502   EGFR 93 02/18/2024 0932   GFRNONAA >60 07/19/2024 0502   GFRNONAA >89 01/31/2015 1641    IMAGING past 24 hours ECHO TEE Result Date: 07/19/2024    TRANSESOPHOGEAL ECHO REPORT   Patient Name:   MONEKA MCQUINN Date of Exam: 07/19/2024 Medical Rec #:  [id number]       Height:       58.0 in Accession #:    [id number]      Weight:       195.8 lb Date of Birth:  03/25/1961       BSA:          1.805 m Patient Age:    63 years        BP:           157/79 mmHg Patient Gender: F               HR:           99 bpm. Exam Location:  Inpatient Procedure: Transesophageal Echo, Cardiac Doppler, Color Doppler and Saline             Contrast Bubble Study (Both Spectral and Color Flow Doppler were            utilized during procedure). Indications:     Stroke  History:         Patient has prior history of Echocardiogram examinations, most                  recent 07/18/2024. CHF, Stroke, COPD, PAD and CKD, stage 2; Risk                  Factors:Hypertension, Diabetes and Dyslipidemia.  Sonographer:     Thea Norlander RCS Referring Phys:  [id number] MANUELITA B ROBERTS Diagnosing Phys: Vinie Maxcy MD PROCEDURE: After discussion of the risks and benefits of a TEE, an informed consent was obtained from the patient. The transesophogeal  probe was passed without difficulty through the esophogus of the patient. Imaged were obtained with the patient in a supine position. Sedation performed by different physician. The patient was monitored while under deep sedation. Anesthestetic sedation was provided intravenously by Anesthesiology: 120mg  of Propofol , 100mg  of Lidocaine . The patient developed no complications during the procedure.  IMPRESSIONS  1. Left ventricular ejection fraction, by estimation, is 65 to 70%. The left ventricle has normal function. There is mild left ventricular hypertrophy.  2. Right ventricular systolic function is normal. The right ventricular size is normal.  3. No left atrial/left atrial appendage thrombus was detected.  4. The mitral valve is normal in structure. Trivial mitral valve regurgitation.  5. The aortic valve is normal in structure. Aortic valve regurgitation is not visualized.  6. Evidence of atrial level shunting detected by color flow Doppler. Agitated saline contrast bubble study was positive with shunting observed within 3-6 cardiac cycles suggestive of interatrial shunt. There is a small patent foramen ovale with predominantly right to left shunting across the atrial septum. Conclusion(s)/Recommendation(s): Findings are concerning for an interatrial shunt as detailed above. Consider structural heart team evaluation  for possible PFO closure in the setting of stroke. FINDINGS  Left Ventricle: Left ventricular ejection fraction, by estimation, is 65 to 70%. The left ventricle has normal function. The left ventricular internal cavity size was normal in size. There is mild left ventricular hypertrophy. Right Ventricle: The right ventricular size is normal. No increase in right ventricular wall thickness. Right ventricular systolic function is normal. Left Atrium: Left atrial size was normal in size. No left atrial/left atrial appendage thrombus was detected. Right Atrium: Right atrial size was normal in size. Prominent Eustachian valve. Pericardium: There is no evidence of pericardial effusion. Mitral Valve: The mitral valve is normal in structure. Trivial mitral valve regurgitation. Tricuspid Valve: The tricuspid valve is normal in structure. Tricuspid valve regurgitation is not demonstrated. Aortic Valve: The aortic valve is normal in structure. Aortic valve regurgitation is not visualized. Pulmonic Valve: The pulmonic valve was normal in structure. Pulmonic valve regurgitation is trivial. Aorta: The aortic root and ascending aorta are structurally normal, with no evidence of dilitation. There is minimal (Grade I) plaque involving the aortic arch and descending aorta. IAS/Shunts: The interatrial septum is aneurysmal. Evidence of atrial level shunting detected by color flow Doppler. Agitated saline contrast was given intravenously to evaluate for intracardiac shunting. Agitated saline contrast bubble study was positive  with shunting observed within 3-6 cardiac cycles suggestive of interatrial shunt. A small patent foramen ovale is detected with predominantly right to left shunting across the atrial septum. Additional Comments: Spectral Doppler performed. LEFT VENTRICLE PLAX 2D LVOT diam:     1.99 cm LVOT Area:     3.11 cm   AORTA Ao Asc diam: 3.20 cm  SHUNTS Systemic Diam: 1.99 cm Vinie Maxcy MD Electronically signed by  Vinie Maxcy MD Signature Date/Time: 07/19/2024/10:14:08 AM    Final    EP STUDY Result Date: 07/19/2024 See surgical note for result.  VAS US  CAROTID Result Date: 07/18/2024 Carotid Arterial Duplex Study Patient Name:  JUDIANNE SEIPLE  Date of Exam:   07/18/2024 Medical Rec #: [id number]        Accession #:    [id number] Date of Birth: 09-Oct-1961        Patient Gender: F Patient Age:   30 years Exam Location:  [facility] Procedure:      VAS US  CAROTID Referring Phys: MCNEILL KIRKPATRICK --------------------------------------------------------------------------------  Indications:  CVA. Risk Factors: Hypertension, hyperlipidemia, Diabetes, current smoker. Performing Technologist: Elmarie Lindau, RVT  Examination Guidelines: A complete evaluation includes B-mode imaging, spectral Doppler, color Doppler, and power Doppler as needed of all accessible portions of each vessel. Bilateral testing is considered an integral part of a complete examination. Limited examinations for reoccurring indications may be performed as noted.  Right Carotid Findings: +----------+--------+--------+--------+--------------------------+--------+           PSV cm/sEDV cm/sStenosisPlaque Description        Comments +----------+--------+--------+--------+--------------------------+--------+ CCA Prox  75      9                                                  +----------+--------+--------+--------+--------------------------+--------+ CCA Distal60      11              irregular and heterogenous         +----------+--------+--------+--------+--------------------------+--------+ ICA Prox  54      11              calcific and heterogenous          +----------+--------+--------+--------+--------------------------+--------+ ICA Distal46      15                                                 +----------+--------+--------+--------+--------------------------+--------+ ECA       104                                                         +----------+--------+--------+--------+--------------------------+--------+ +----------+--------+-------+----------------+-------------------+           PSV cm/sEDV cmsDescribe        Arm Pressure (mmHG) +----------+--------+-------+----------------+-------------------+ Subclavian100            Multiphasic, WNL                    +----------+--------+-------+----------------+-------------------+ +---------+--------+--+--------+--+---------+ VertebralPSV cm/s52EDV cm/s14Antegrade +---------+--------+--+--------+--+---------+  Left Carotid Findings: +----------+--------+--------+--------+--------------------------+--------+           PSV cm/sEDV cm/sStenosisPlaque Description        Comments +----------+--------+--------+--------+--------------------------+--------+ CCA Prox  73      10                                                 +----------+--------+--------+--------+--------------------------+--------+ CCA Distal69      11              irregular and heterogenous         +----------+--------+--------+--------+--------------------------+--------+ ICA Prox  51      9               irregular and heterogenous         +----------+--------+--------+--------+--------------------------+--------+ ICA Distal70      13                                                 +----------+--------+--------+--------+--------------------------+--------+  ECA       93                                                         +----------+--------+--------+--------+--------------------------+--------+ +----------+--------+--------+----------------+-------------------+           PSV cm/sEDV cm/sDescribe        Arm Pressure (mmHG) +----------+--------+--------+----------------+-------------------+ [id number]             Multiphasic, WNL                    +----------+--------+--------+----------------+-------------------+  +---------+--------+--+--------+--+---------+ VertebralPSV cm/s86EDV cm/s21Antegrade +---------+--------+--+--------+--+---------+   Summary: Right Carotid: Velocities in the right ICA are consistent with a 1-39% stenosis. Left Carotid: Velocities in the left ICA are consistent with a 1-39% stenosis. Vertebrals:  Bilateral vertebral arteries demonstrate antegrade flow. Subclavians: Normal flow hemodynamics were seen in bilateral subclavian              arteries. *See table(s) above for measurements and observations.  Electronically signed by Fonda Rim on 07/18/2024 at 6:12:13 PM.    Final     Vitals:   07/19/24 0750 07/19/24 0850 07/19/24 0900 07/19/24 0910  BP: (!) 151/61 129/71 (!) 144/79 (!) 159/66  Pulse: 82 83 82 76  Resp: 19 17 13 19   Temp: 97.6 F (36.4 C)     TempSrc: Temporal     SpO2: 95% 95% 93% 94%  Weight:      Height:         PHYSICAL EXAM General:  Alert, well-nourished, well-developed patient in no acute distress Psych: Mood labile, tearful at times CV: Regular rate and rhythm on monitor Respiratory:  Regular, unlabored respirations on room air   NEURO:  Mental Status: AA&Ox3, patient is able to give clear and coherent history Speech/Language: speech is with mild dysarthria (edentulous) or aphasia.    Cranial Nerves:  II: PERRL. Visual fields full.  III, IV, VI: EOMI. Eyelids elevate symmetrically.  V: Sensation is intact to light touch and symmetrical to face.  VII: Face is symmetrical resting and smiling VIII: hearing intact to voice. IX, X: Voice is mildly dysarthric KP:Dynloizm shrug 5/5. XII: tongue is midline without fasciculations. Motor: Able to move all 4 extremities with normal antigravity strength Tone: is normal and bulk is normal Sensation- Intact to light touch bilaterally. Extinction absent to light touch to DSS.   Coordination: FTN intact bilaterally, HKS: Unable to perform due to bilateral BKA's Gait- deferred  Most Recent NIH  1a  Level of Conscious.: 0 1b LOC Questions: 0 1c LOC Commands: 0 2 Best Gaze: 0 3 Visual: 0 4 Facial Palsy: 0 5a Motor Arm - left: 0 5b Motor Arm - Right: 0 6a Motor Leg - Left: 0 6b Motor Leg - Right: 0 7 Limb Ataxia: 0 8 Sensory: 0 9 Best Language: 0 10 Dysarthria: 0 11 Extinct. and Inatten.: 0 TOTAL: 0   ASSESSMENT/PLAN  Ms. CLAUDELL RHODY is a 63 y.o. female with history of CHF, CKD, diabetes, bilateral BKA, hypertension and hyperlipidemia who originally presented with several days of confusion and was found to have left MCA territory stroke.  NIH on Admission 2  Acute Ischemic Infarct:  left patchy MCA territory infarcts  Etiology: Possibly embolic CT head No acute abnormality.  MRI acute posterior left MCA territory infarcts involving the  posterior insula and overlying left parietal lobe MRA mild atherosclerotic changes in cavernous ICAs without significant stenosis, mild attenuation of MCA branches vessels, worst on the left, moderate narrowing of distal PCA branch vessels bilaterally Carotid Doppler bilateral 1-39% carotid stenosis. 2D Echo EF 65 to 70%.  Left atrial size normal. LDL 36 HgbA1c 9.8 VTE prophylaxis -Lovenox  No antithrombotic prior to admission, now on aspirin  81 mg daily and clopidogrel  75 mg daily for 3 weeks and then aspirin  alone. Therapy recommendations:  Home Health PT and Home Health OT Disposition: Pending  Hypertension Home meds: Spironolactone  50 mg daily, amlodipine  10 mg daily Stable Blood Pressure Goal: BP less than 220/110   Hyperlipidemia Home meds: Rosuvastatin  20 mg daily, resumed in hospital LDL 36, goal < 70 Continue statin at discharge  Diabetes type II poorly controlled Home meds: Tresiba  insulin  65 units daily, metformin  500 mg daily, semaglutide  1 mg weekly HgbA1c 9.8, goal < 7.0 CBGs SSI Recommend close follow-up with PCP for better DM control  Tobacco Abuse Patient smokes 0.5 packs per day for 53 years      Ready to  quit? N/A Nicotine  replacement therapy provided  Substance Abuse Patient uses marijuana UDS positive for  THC       Ready to quit? N/A TOC consult for cessation placed  Dysphagia Patient has post-stroke dysphagia, SLP consulted    Diet   Diet Heart Room service appropriate? Yes; Fluid consistency: Thin   Advance diet as tolerated  Other Stroke Risk Factors Obesity, Body mass index is 40.92 kg/m., BMI >/= 30 associated with increased stroke risk, recommend weight loss, diet and exercise as appropriate    Other Active Problems Severe headache-headache cocktail with 25 mg Benadryl , 10 mg Compazine  and 15 mg Toradol  given, also start Depakote  500 mg daily  Hospital day # 4     Patient presented with several day history of headache and confusion and CT and MRI confirmed left parietal embolic infarct.  Recommend continue ongoing stroke workup with second carotid ultrasound, echocardiogram, cardiac monitoring.  Aspirin  and Plavix  for 3 weeks followed by aspirin  alone and aggressive risk factor modification.  She will likely need outpatient 30-day heart monitor for paroxysmal A-fib.  Treat headache symptomatically with migraine cocktail and start Depakote  ER 500 mg daily as she seems to be having significant underlying depression also.  Patient has a small PFO but with a ROPE score of 3 she has negligible chance that the stroke is related to the PFO and hence would not recommend a endovascular PFO closure.  Check lower extremity venous Dopplers for DVT and TCD bubble study to further categorize the PFO.   I personally spent a total of 35 minutes in the care of the patient today including getting/reviewing separately obtained history, performing a medically appropriate exam/evaluation, counseling and educating, placing orders, referring and communicating with other health care professionals, documenting clinical information in the EHR, independently interpreting results, and coordinating care.         Eather Popp, MD Medical Director [facility] Pager: [phone] 07/19/2024 12:08 PM   To contact Stroke Continuity provider, please refer to [email or web address]. After hours, contact General Neurology

## 2024-07-19 NOTE — Progress Notes (Addendum)
 HD#4 SUBJECTIVE:  Patient Summary: Jennifer [name] is a 63 y.o. person living with a history of COPD, HLD, T2DM who presented with confusion and admitted for encephalopathy.   Overnight Events: Patient refused bladder scan x3   Interim History: Patient evaluated at bedside. She is still experiencing headaches, which have not improved since yesterday. Feels that her confusion has improved and feels that she has a clearer mind now. She denies SOB or nausea. Her pain is being moderately controlled, and states it is better than it was yesterday. She voices understanding of recent embolic study results. She feels that her weakness is about the same as the day prior.  OBJECTIVE:  Vital Signs: Vitals:   07/19/24 0750 07/19/24 0850 07/19/24 0900 07/19/24 0910  BP: (!) 151/61 129/71 (!) 144/79 (!) 159/66  Pulse: 82 83 82 76  Resp: 19 17 13 19   Temp: 97.6 F (36.4 C)     TempSrc: Temporal     SpO2: 95% 95% 93% 94%  Weight:      Height:       Supplemental O2: Room Air SpO2: 94 % O2 Flow Rate (L/min): 4 L/min  Filed Weights   07/14/24 1458 07/17/24 0400  Weight: 99 kg 88.8 kg     Intake/Output Summary (Last 24 hours) at 07/19/2024 1153 Last data filed at 07/19/2024 1130 Gross per 24 hour  Intake 786 ml  Output 0 ml  Net 786 ml   Net IO Since Admission: 3,297.79 mL [07/19/24 1153]  Physical Exam: Physical Exam Constitutional:      General: She is not in acute distress.    Appearance: Normal appearance.  HENT:     Head: Normocephalic.  Eyes:     Extraocular Movements: Extraocular movements intact.     Pupils: Pupils are equal, round, and reactive to light.  Cardiovascular:     Rate and Rhythm: Normal rate and regular rhythm.  Pulmonary:     Effort: Pulmonary effort is normal.     Breath sounds: Normal breath sounds.  Abdominal:     General: Abdomen is flat.     Palpations: Abdomen is soft.  Skin:    General: Skin is warm and dry.  Neurological:     General: No  focal deficit present.     Mental Status: She is alert and oriented to person, place, and time.  Psychiatric:        Mood and Affect: Mood normal.        Behavior: Behavior normal.     Patient Lines/Drains/Airways Status     Active Line/Drains/Airways     Name Placement date Placement time Site Days   Peripheral IV 07/18/24 22 G Anterior;Right Forearm 07/18/24  1055  Forearm  1   Wound / Incision (Open or Dehisced) 11/07/20 (MASD) Moisture Associated Skin Damage Buttocks Right;Left;Bilateral MASD redness bilateral buttocks 11/07/20  1800  Buttocks  1350   Wound / Incision (Open or Dehisced) 03/25/24 Venous stasis ulcer Leg Right Full thickness R lower leg 03/25/24  0800  Leg  116   Wound / Incision (Open or Dehisced) 03/25/24 Other (Comment) Right R foot necrotic tissue 03/25/24  0800  --  116            Pertinent labs and imaging:      Latest Ref Rng & Units 07/19/2024    5:02 AM 07/18/2024    6:06 AM 07/17/2024   12:42 PM  CBC  WBC 4.0 - 10.5 K/uL 6.6  7.1  6.9  Hemoglobin 12.0 - 15.0 g/dL 89.2  89.5  88.6   Hematocrit 36.0 - 46.0 % 35.2  32.9  36.7   Platelets 150 - 400 K/uL 184  166  197        Latest Ref Rng & Units 07/19/2024    5:02 AM 07/18/2024    6:06 AM 07/17/2024    9:28 AM  CMP  Glucose 70 - 99 mg/dL 768  811  747   BUN 8 - 23 mg/dL 9  <5  <5   Creatinine 0.44 - 1.00 mg/dL 9.12  9.12  9.00   Sodium 135 - 145 mmol/L 137  137  136   Potassium 3.5 - 5.1 mmol/L 4.9  4.0  4.5   Chloride 98 - 111 mmol/L 104  104  105   CO2 22 - 32 mmol/L 23  22  20    Calcium  8.9 - 10.3 mg/dL 9.1  8.9  9.0     ECHO TEE Result Date: 07/19/2024    TRANSESOPHOGEAL ECHO REPORT   Patient Name:   Jennifer [name] Date of Exam: 07/19/2024 Medical Rec #:  [id number]       Height:       58.0 in Accession #:    [id number]      Weight:       195.8 lb Date of Birth:  November 21, 1961       BSA:          1.805 m Patient Age:    63 years        BP:           157/79 mmHg Patient Gender: F                HR:           99 bpm. Exam Location:  Inpatient Procedure: Transesophageal Echo, Cardiac Doppler, Color Doppler and Saline            Contrast Bubble Study (Both Spectral and Color Flow Doppler were            utilized during procedure). Indications:     Stroke  History:         Patient has prior history of Echocardiogram examinations, most                  recent 07/18/2024. CHF, Stroke, COPD, PAD and CKD, stage 2; Risk                  Factors:Hypertension, Diabetes and Dyslipidemia.  Sonographer:     Thea Norlander RCS Referring Phys:  [phone] MANUELITA B ROBERTS Diagnosing Phys: Vinie Maxcy MD PROCEDURE: After discussion of the risks and benefits of a TEE, an informed consent was obtained from the patient. The transesophogeal probe was passed without difficulty through the esophogus of the patient. Imaged were obtained with the patient in a supine position. Sedation performed by different physician. The patient was monitored while under deep sedation. Anesthestetic sedation was provided intravenously by Anesthesiology: 120mg  of Propofol , 100mg  of Lidocaine . The patient developed no complications during the procedure.  IMPRESSIONS  1. Left ventricular ejection fraction, by estimation, is 65 to 70%. The left ventricle has normal function. There is mild left ventricular hypertrophy.  2. Right ventricular systolic function is normal. The right ventricular size is normal.  3. No left atrial/left atrial appendage thrombus was detected.  4. The mitral valve is normal in structure. Trivial mitral valve regurgitation.  5. The aortic valve is normal in structure.  Aortic valve regurgitation is not visualized.  6. Evidence of atrial level shunting detected by color flow Doppler. Agitated saline contrast bubble study was positive with shunting observed within 3-6 cardiac cycles suggestive of interatrial shunt. There is a small patent foramen ovale with predominantly right to left shunting across the atrial septum.  Conclusion(s)/Recommendation(s): Findings are concerning for an interatrial shunt as detailed above. Consider structural heart team evaluation for possible PFO closure in the setting of stroke. FINDINGS  Left Ventricle: Left ventricular ejection fraction, by estimation, is 65 to 70%. The left ventricle has normal function. The left ventricular internal cavity size was normal in size. There is mild left ventricular hypertrophy. Right Ventricle: The right ventricular size is normal. No increase in right ventricular wall thickness. Right ventricular systolic function is normal. Left Atrium: Left atrial size was normal in size. No left atrial/left atrial appendage thrombus was detected. Right Atrium: Right atrial size was normal in size. Prominent Eustachian valve. Pericardium: There is no evidence of pericardial effusion. Mitral Valve: The mitral valve is normal in structure. Trivial mitral valve regurgitation. Tricuspid Valve: The tricuspid valve is normal in structure. Tricuspid valve regurgitation is not demonstrated. Aortic Valve: The aortic valve is normal in structure. Aortic valve regurgitation is not visualized. Pulmonic Valve: The pulmonic valve was normal in structure. Pulmonic valve regurgitation is trivial. Aorta: The aortic root and ascending aorta are structurally normal, with no evidence of dilitation. There is minimal (Grade I) plaque involving the aortic arch and descending aorta. IAS/Shunts: The interatrial septum is aneurysmal. Evidence of atrial level shunting detected by color flow Doppler. Agitated saline contrast was given intravenously to evaluate for intracardiac shunting. Agitated saline contrast bubble study was positive  with shunting observed within 3-6 cardiac cycles suggestive of interatrial shunt. A small patent foramen ovale is detected with predominantly right to left shunting across the atrial septum. Additional Comments: Spectral Doppler performed. LEFT VENTRICLE PLAX 2D LVOT diam:      1.99 cm LVOT Area:     3.11 cm   AORTA Ao Asc diam: 3.20 cm  SHUNTS Systemic Diam: 1.99 cm Vinie Maxcy MD Electronically signed by Vinie Maxcy MD Signature Date/Time: 07/19/2024/10:14:08 AM    Final    EP STUDY Result Date: 07/19/2024 See surgical note for result.  VAS US  CAROTID Result Date: 07/18/2024 Carotid Arterial Duplex Study Patient Name:  Jennifer [name]  Date of Exam:   07/18/2024 Medical Rec #: [id number]        Accession #:    [id number] Date of Birth: 09/21/1961        Patient Gender: F Patient Age:   65 years Exam Location:  [facility] Procedure:      VAS US  CAROTID Referring Phys: MCNEILL KIRKPATRICK --------------------------------------------------------------------------------  Indications:  CVA. Risk Factors: Hypertension, hyperlipidemia, Diabetes, current smoker. Performing Technologist: Elmarie Lindau, RVT  Examination Guidelines: A complete evaluation includes B-mode imaging, spectral Doppler, color Doppler, and power Doppler as needed of all accessible portions of each vessel. Bilateral testing is considered an integral part of a complete examination. Limited examinations for reoccurring indications may be performed as noted.  Right Carotid Findings: +----------+--------+--------+--------+--------------------------+--------+           PSV cm/sEDV cm/sStenosisPlaque Description        Comments +----------+--------+--------+--------+--------------------------+--------+ CCA Prox  75      9                                                  +----------+--------+--------+--------+--------------------------+--------+  CCA Distal60      11              irregular and heterogenous         +----------+--------+--------+--------+--------------------------+--------+ ICA Prox  54      11              calcific and heterogenous          +----------+--------+--------+--------+--------------------------+--------+ ICA Distal46      15                                                  +----------+--------+--------+--------+--------------------------+--------+ ECA       104                                                        +----------+--------+--------+--------+--------------------------+--------+ +----------+--------+-------+----------------+-------------------+           PSV cm/sEDV cmsDescribe        Arm Pressure (mmHG) +----------+--------+-------+----------------+-------------------+ Subclavian100            Multiphasic, WNL                    +----------+--------+-------+----------------+-------------------+ +---------+--------+--+--------+--+---------+ VertebralPSV cm/s52EDV cm/s14Antegrade +---------+--------+--+--------+--+---------+  Left Carotid Findings: +----------+--------+--------+--------+--------------------------+--------+           PSV cm/sEDV cm/sStenosisPlaque Description        Comments +----------+--------+--------+--------+--------------------------+--------+ CCA Prox  73      10                                                 +----------+--------+--------+--------+--------------------------+--------+ CCA Distal69      11              irregular and heterogenous         +----------+--------+--------+--------+--------------------------+--------+ ICA Prox  51      9               irregular and heterogenous         +----------+--------+--------+--------+--------------------------+--------+ ICA Distal70      13                                                 +----------+--------+--------+--------+--------------------------+--------+ ECA       93                                                         +----------+--------+--------+--------+--------------------------+--------+ +----------+--------+--------+----------------+-------------------+           PSV cm/sEDV cm/sDescribe        Arm Pressure (mmHG) +----------+--------+--------+----------------+-------------------+  [id number]             Multiphasic, WNL                    +----------+--------+--------+----------------+-------------------+ +---------+--------+--+--------+--+---------+ VertebralPSV cm/s86EDV cm/s21Antegrade +---------+--------+--+--------+--+---------+  Summary: Right Carotid: Velocities in the right ICA are consistent with a 1-39% stenosis. Left Carotid: Velocities in the left ICA are consistent with a 1-39% stenosis. Vertebrals:  Bilateral vertebral arteries demonstrate antegrade flow. Subclavians: Normal flow hemodynamics were seen in bilateral subclavian              arteries. *See table(s) above for measurements and observations.  Electronically signed by Fonda Rim on 07/18/2024 at 6:12:13 PM.    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   Plan: #Acute Left Posterior MCA Infarct #Encephalopathy, resolved #Delirium, resolved Yesterday, stroke team evaluated and recommended Depakote  500mg , headache cocktail, and will continue to follow. Patient on Lovenox  for VTE prophylaxis. Stroke team recommends 30 day cardiac monitor. Further embolic workup shows A1c 8.8. Echo shows  EF 65-70% with mild left ventricular hypertrophy and no valvular involvement. Carotid dopplers showed normal to mild stenosis in the bilateral ICAs, but otherwise normal. TEE was completed today and shows right-to-left interatrial shunting, with a ~2.0cm small patent foramen ovale. Cardiology consulted for PFO and recommends outpatient follow up. Bilateral Lower Extremity Ultrasounds ordered and pending. PT/OT evaluated and recommend home health PT/OT.  SLP evaluated and recommends thin diet and continued SLP services. Mood is improving and not tearful today.  - Cardiology to follow up outpatient - Pending bilateral US  LE - Depakote  500mg  daily - Continue ASA and Plavix  - Follow up AM BMP and CBC  #Major Depression in partial remission Was restarted on home duloxetine  60mg  daily. No overnight events. Will monitor patient's confusion and hold medication if worsened encephalopathy. - Continue home duloxetine  60mg .  #Hypertension BP mildly hypertensive 156/71. Blood pressure goal < 220/110.  - Continue home amlodipine  10mg , spironolactone  50mg  daily  #Polypharmacy Patient on many centrally-acting medications, including Baclofen , Robaxin , Myrbetriq , Oxybutynin , Trazodone , and Oxycodone . These could have changed when leaving nursing facility. May be affecting mental state on top of acute stroke. Needs medication reconciliation during this stay. Patient's home duloxetine  and pregabalin  resumed, lower dose. Will continue to monitor throughout this hospitalization.   #Chronic Back Pain due to lumbar radiculopathy Patient having continued pain since holding home oxycodone  with no relief from Tylenol . Oxycodone  2.5 IR was started for pain yesterday. Patient reports moderate improvement of her pain today. - Continue Oxycodone  IR 2.5mg  Q6H - Continue pregabalin  25mg  TID  #Urinary Incontinence Patient prescribed Myrbetriq  and Oxybutynin . Will continue to hold these medications. Patient denied bladder  scans last night . Emphasized the importance of continued bladder scans to ensure she is not retaining urine. Patient voiced understanding. -Continue Q8H scan. If retaining > , perform in-and-out cath.  #Decreased TSH Mildly decreased TSH, free T4 mildly elevated. Less suspicious this  is contributing to patient's presentation. Can follow up outpatient.   #Type 2 Diabetes Mellitus A1c 8.8, improved from 9.8 03/2024. Fasting CBG 228 today. Home meds include Tresiba  65 units daily, Metformin  500mg  daily, and Semaglutide  1mg  weekly. Appetite improving. Patient on 20 units of long acting. -CBG monitoring -Novolog  moderate SSI -Semglee  20 units daily  #Hyperlipidemia LDL 36. On home rosuvastatin  20mg  -Continue home rosuvastatin  at discharge per stroke team  #Dysphagia Patient has post-stroke dysphagia. SLP consulted and recommends continued ST services throughout hospitalization -Thin diet, advance as tolerated  #Hypomagnesemia, resolved Magnesium  previously low at 1.6, replenished with mag sulfate 2g. Magnesium  improved 1.8, sustained at 1.8.  #Stable Chronic Conditions GERD- continue home pantoprazole  40mg  CKD Stage 2 due to Type 2 DM- stable/at baseline, monitor BMP for renal function PAD with history of revascularization- continue home revascularization 20mg  at discharge COPD- lung exam stable, continue home anora ellipta Gastroparesis, likely secondary to diabetes R Renal Lesion - 5cm mass lesion in the midpole of the right kidney. Will need to follow up with outpatient MRI   Best Practice: Diet: Regular diet IVF: Fluids: none VTE: enoxaparin  (LOVENOX ) injection 40 mg Start: 07/14/24 2030 Code: Full  Disposition planning: Therapy Recs: Home Health PT, OT, SLP  Family Contact: son DISPO: Anticipated discharge pending clinical improvement.  Signature:  Dolores Ewing [facility]  11:53 AM, 07/19/2024  On Call pager [phone]

## 2024-07-20 ENCOUNTER — Other Ambulatory Visit: Payer: Self-pay | Admitting: Internal Medicine

## 2024-07-20 ENCOUNTER — Encounter (HOSPITAL_COMMUNITY): Payer: Self-pay | Admitting: Internal Medicine

## 2024-07-20 ENCOUNTER — Other Ambulatory Visit (HOSPITAL_COMMUNITY): Payer: Self-pay

## 2024-07-20 ENCOUNTER — Other Ambulatory Visit: Payer: Self-pay

## 2024-07-20 DIAGNOSIS — G9341 Metabolic encephalopathy: Principal | ICD-10-CM

## 2024-07-20 HISTORY — DX: Metabolic encephalopathy: G93.41

## 2024-07-20 LAB — BASIC METABOLIC PANEL WITH GFR
Anion gap: 9 (ref 5–15)
BUN: 14 mg/dL (ref 8–23)
CO2: 22 mmol/L (ref 22–32)
Calcium: 9.2 mg/dL (ref 8.9–10.3)
Chloride: 101 mmol/L (ref 98–111)
Creatinine, Ser: 0.87 mg/dL (ref 0.44–1.00)
GFR, Estimated: >60 mL/min (ref >60)
Glucose, Bld: 302 mg/dL — ABNORMAL HIGH (ref 70–99)
Potassium: 4.8 mmol/L (ref 3.5–5.1)
Sodium: 132 mmol/L — ABNORMAL LOW (ref 135–145)

## 2024-07-20 LAB — CBC
HCT: 36.6 % (ref 36.0–46.0)
Hemoglobin: 11.4 g/dL — ABNORMAL LOW (ref 12.0–15.0)
MCH: 26.1 pg (ref 26.0–34.0)
MCHC: 31.1 g/dL (ref 30.0–36.0)
MCV: 83.9 fL (ref 80.0–100.0)
Platelets: 200 K/uL (ref 150–400)
RBC: 4.36 MIL/uL (ref 3.87–5.11)
RDW: 19.6 % — ABNORMAL HIGH (ref 11.5–15.5)
WBC: 7.8 K/uL (ref 4.0–10.5)
nRBC: 0 % (ref 0.0–0.2)

## 2024-07-20 LAB — GLUCOSE, CAPILLARY
Glucose-Capillary: 230 mg/dL — ABNORMAL HIGH (ref 70–99)
Glucose-Capillary: 252 mg/dL — ABNORMAL HIGH (ref 70–99)

## 2024-07-20 MED ORDER — DIVALPROEX SODIUM ER 500 MG PO TB24
500.0000 mg | ORAL_TABLET | Freq: Every day | ORAL | 0 refills | Status: DC
Start: 2024-07-20 — End: 2024-08-10
  Filled 2024-07-20: qty 30, 30d supply, fill #0

## 2024-07-20 MED ORDER — CLOPIDOGREL BISULFATE 75 MG PO TABS
75.0000 mg | ORAL_TABLET | Freq: Every day | ORAL | 0 refills | Status: DC
  Filled 2024-07-20: qty 17, 17d supply, fill #0

## 2024-07-20 MED ORDER — OXYCODONE HCL 5 MG PO TABS
2.5000 mg | ORAL_TABLET | ORAL | Status: DC | PRN
Start: 2024-07-20 — End: 2024-08-09

## 2024-07-20 MED ORDER — ASPIRIN 81 MG PO TBEC
81.0000 mg | DELAYED_RELEASE_TABLET | Freq: Every day | ORAL | 2 refills | Status: DC
  Filled 2024-07-20: qty 30, 30d supply, fill #0

## 2024-07-20 MED ORDER — TRESIBA FLEXTOUCH 200 UNIT/ML ~~LOC~~ SOPN: 25.0000 [IU] | PEN_INJECTOR | Freq: Every day | SUBCUTANEOUS | Status: DC

## 2024-07-20 MED ORDER — PREGABALIN 25 MG PO CAPS
25.0000 mg | ORAL_CAPSULE | Freq: Three times a day (TID) | ORAL | 0 refills | Status: DC
  Filled 2024-07-20: qty 90, 30d supply, fill #0

## 2024-07-20 NOTE — Plan of Care (Signed)
 Problem: Education: Goal: Ability to describe self-care measures that may prevent or decrease complications (Diabetes Survival Skills Education) will improve 07/20/2024 1305 by Johnie Will HERO, RN Outcome: Adequate for Discharge 07/20/2024 1002 by Johnie Will HERO, RN Outcome: Progressing Goal: Individualized Educational Video(s) 07/20/2024 1305 by Johnie Will HERO, RN Outcome: Adequate for Discharge 07/20/2024 1002 by Johnie Will HERO, RN Outcome: Progressing   Problem: Coping: Goal: Ability to adjust to condition or change in health will improve 07/20/2024 1305 by Johnie Will HERO, RN Outcome: Adequate for Discharge 07/20/2024 1002 by Johnie Will HERO, RN Outcome: Progressing   Problem: Fluid Volume: Goal: Ability to maintain a balanced intake and output will improve 07/20/2024 1305 by Johnie Will HERO, RN Outcome: Adequate for Discharge 07/20/2024 1002 by Johnie Will HERO, RN Outcome: Progressing   Problem: Health Behavior/Discharge Planning: Goal: Ability to identify and utilize available resources and services will improve 07/20/2024 1305 by Johnie Will HERO, RN Outcome: Adequate for Discharge 07/20/2024 1002 by Johnie Will HERO, RN Outcome: Progressing Goal: Ability to manage health-related needs will improve 07/20/2024 1305 by Johnie Will HERO, RN Outcome: Adequate for Discharge 07/20/2024 1002 by Johnie Will HERO, RN Outcome: Progressing   Problem: Metabolic: Goal: Ability to maintain appropriate glucose levels will improve 07/20/2024 1305 by Johnie Will HERO, RN Outcome: Adequate for Discharge 07/20/2024 1002 by Johnie Will HERO, RN Outcome: Progressing   Problem: Nutritional: Goal: Maintenance of adequate nutrition will improve 07/20/2024 1305 by Johnie Will HERO, RN Outcome: Adequate for Discharge 07/20/2024 1002 by Johnie Will HERO, RN Outcome: Progressing Goal: Progress toward achieving an optimal weight will  improve 07/20/2024 1305 by Johnie Will HERO, RN Outcome: Adequate for Discharge 07/20/2024 1002 by Johnie Will HERO, RN Outcome: Progressing   Problem: Skin Integrity: Goal: Risk for impaired skin integrity will decrease 07/20/2024 1305 by Johnie Will HERO, RN Outcome: Adequate for Discharge 07/20/2024 1002 by Johnie Will HERO, RN Outcome: Progressing   Problem: Tissue Perfusion: Goal: Adequacy of tissue perfusion will improve 07/20/2024 1305 by Johnie Will HERO, RN Outcome: Adequate for Discharge 07/20/2024 1002 by Johnie Will HERO, RN Outcome: Progressing   Problem: Acute Rehab PT Goals(only PT should resolve) Goal: Pt Will Go Supine/Side To Sit Outcome: Adequate for Discharge Goal: Pt Will Go Sit To Supine/Side Outcome: Adequate for Discharge Goal: Pt Will Transfer Bed To Chair/Chair To Bed Outcome: Adequate for Discharge Goal: PT Additional Goal #1 Outcome: Adequate for Discharge   Problem: Education: Goal: Knowledge of General Education information will improve Description: Including pain rating scale, medication(s)/side effects and non-pharmacologic comfort measures 07/20/2024 1305 by Johnie Will HERO, RN Outcome: Adequate for Discharge 07/20/2024 1002 by Johnie Will HERO, RN Outcome: Progressing   Problem: Health Behavior/Discharge Planning: Goal: Ability to manage health-related needs will improve 07/20/2024 1305 by Johnie Will HERO, RN Outcome: Adequate for Discharge 07/20/2024 1002 by Johnie Will HERO, RN Outcome: Progressing   Problem: Clinical Measurements: Goal: Ability to maintain clinical measurements within normal limits will improve 07/20/2024 1305 by Johnie Will HERO, RN Outcome: Adequate for Discharge 07/20/2024 1002 by Johnie Will HERO, RN Outcome: Progressing Goal: Will remain free from infection 07/20/2024 1305 by Johnie Will HERO, RN Outcome: Adequate for Discharge 07/20/2024 1002 by Johnie Will HERO,  RN Outcome: Progressing Goal: Diagnostic test results will improve 07/20/2024 1305 by Johnie Will HERO, RN Outcome: Adequate for Discharge 07/20/2024 1002 by Johnie Will HERO, RN Outcome: Progressing Goal: Respiratory complications will improve 07/20/2024 1305 by Johnie Will HERO, RN Outcome: Adequate for Discharge 07/20/2024 1002 by  Johnie Will HERO, RN Outcome: Progressing Goal: Cardiovascular complication will be avoided 07/20/2024 1305 by Johnie Will HERO, RN Outcome: Adequate for Discharge 07/20/2024 1002 by Johnie Will HERO, RN Outcome: Progressing   Problem: Activity: Goal: Risk for activity intolerance will decrease 07/20/2024 1305 by Johnie Will HERO, RN Outcome: Adequate for Discharge 07/20/2024 1002 by Johnie Will HERO, RN Outcome: Progressing   Problem: Nutrition: Goal: Adequate nutrition will be maintained 07/20/2024 1305 by Johnie Will HERO, RN Outcome: Adequate for Discharge 07/20/2024 1002 by Johnie Will HERO, RN Outcome: Progressing   Problem: Coping: Goal: Level of anxiety will decrease 07/20/2024 1305 by Johnie Will HERO, RN Outcome: Adequate for Discharge 07/20/2024 1002 by Johnie Will HERO, RN Outcome: Progressing   Problem: Elimination: Goal: Will not experience complications related to bowel motility 07/20/2024 1305 by Johnie Will HERO, RN Outcome: Adequate for Discharge 07/20/2024 1002 by Johnie Will HERO, RN Outcome: Progressing Goal: Will not experience complications related to urinary retention 07/20/2024 1305 by Johnie Will HERO, RN Outcome: Adequate for Discharge 07/20/2024 1002 by Johnie Will HERO, RN Outcome: Progressing   Problem: Pain Managment: Goal: General experience of comfort will improve and/or be controlled 07/20/2024 1305 by Johnie Will HERO, RN Outcome: Adequate for Discharge 07/20/2024 1002 by Johnie Will HERO, RN Outcome: Progressing   Problem: Safety: Goal: Ability to remain  free from injury will improve 07/20/2024 1305 by Johnie Will HERO, RN Outcome: Adequate for Discharge 07/20/2024 1002 by Johnie Will HERO, RN Outcome: Progressing   Problem: Skin Integrity: Goal: Risk for impaired skin integrity will decrease 07/20/2024 1305 by Johnie Will HERO, RN Outcome: Adequate for Discharge 07/20/2024 1002 by Johnie Will HERO, RN Outcome: Progressing   Problem: Acute Rehab OT Goals (only OT should resolve) Goal: Pt. Will Perform Lower Body Dressing Outcome: Adequate for Discharge Goal: Pt. Will Transfer To Toilet Outcome: Adequate for Discharge Goal: OT Additional ADL Goal #1 Outcome: Adequate for Discharge   Problem: Education: Goal: Knowledge of disease or condition will improve 07/20/2024 1305 by Johnie Will HERO, RN Outcome: Adequate for Discharge 07/20/2024 1002 by Johnie Will HERO, RN Outcome: Progressing 07/20/2024 0742 by Johnie Will HERO, RN Outcome: Progressing Goal: Knowledge of secondary prevention will improve (MUST DOCUMENT ALL) 07/20/2024 1305 by Johnie Will HERO, RN Outcome: Adequate for Discharge 07/20/2024 1002 by Johnie Will HERO, RN Outcome: Progressing 07/20/2024 0742 by Johnie Will HERO, RN Outcome: Progressing Goal: Knowledge of patient specific risk factors will improve (DELETE if not current risk factor) 07/20/2024 1305 by Johnie Will HERO, RN Outcome: Adequate for Discharge 07/20/2024 1002 by Johnie Will HERO, RN Outcome: Progressing 07/20/2024 0742 by Johnie Will HERO, RN Outcome: Progressing   Problem: Ischemic Stroke/TIA Tissue Perfusion: Goal: Complications of ischemic stroke/TIA will be minimized 07/20/2024 1305 by Johnie Will HERO, RN Outcome: Adequate for Discharge 07/20/2024 1002 by Johnie Will HERO, RN Outcome: Progressing 07/20/2024 0742 by Johnie Will HERO, RN Outcome: Progressing   Problem: Coping: Goal: Will verbalize positive feelings about self 07/20/2024 1305 by  Johnie Will HERO, RN Outcome: Adequate for Discharge 07/20/2024 1002 by Johnie Will HERO, RN Outcome: Progressing 07/20/2024 0742 by Johnie Will HERO, RN Outcome: Progressing Goal: Will identify appropriate support needs 07/20/2024 1305 by Johnie Will HERO, RN Outcome: Adequate for Discharge 07/20/2024 1002 by Johnie Will HERO, RN Outcome: Progressing 07/20/2024 0742 by Johnie Will HERO, RN Outcome: Progressing   Problem: Health Behavior/Discharge Planning: Goal: Ability to manage health-related needs will improve 07/20/2024 1305 by Johnie Will HERO, RN Outcome: Adequate for Discharge 07/20/2024 1002  by Johnie Will HERO, RN Outcome: Progressing 07/20/2024 [phone] by Johnie Will HERO, RN Outcome: Progressing Goal: Goals will be collaboratively established with patient/family 07/20/2024 1305 by Johnie Will HERO, RN Outcome: Adequate for Discharge 07/20/2024 1002 by Johnie Will HERO, RN Outcome: Progressing 07/20/2024 9257 by Johnie Will HERO, RN Outcome: Progressing   Problem: Self-Care: Goal: Ability to participate in self-care as condition permits will improve 07/20/2024 1305 by Johnie Will HERO, RN Outcome: Adequate for Discharge 07/20/2024 1002 by Johnie Will HERO, RN Outcome: Progressing 07/20/2024 9257 by Johnie Will HERO, RN Outcome: Progressing Goal: Verbalization of feelings and concerns over difficulty with self-care will improve 07/20/2024 1305 by Johnie Will HERO, RN Outcome: Adequate for Discharge 07/20/2024 1002 by Johnie Will HERO, RN Outcome: Progressing 07/20/2024 9257 by Johnie Will HERO, RN Outcome: Progressing Goal: Ability to communicate needs accurately will improve 07/20/2024 1305 by Johnie Will HERO, RN Outcome: Adequate for Discharge 07/20/2024 1002 by Johnie Will HERO, RN Outcome: Progressing 07/20/2024 9257 by Johnie Will HERO, RN Outcome: Progressing   Problem: Nutrition: Goal: Risk of aspiration  will decrease 07/20/2024 1305 by Johnie Will HERO, RN Outcome: Adequate for Discharge 07/20/2024 1002 by Johnie Will HERO, RN Outcome: Progressing 07/20/2024 0742 by Johnie Will HERO, RN Outcome: Progressing Goal: Dietary intake will improve 07/20/2024 1305 by Johnie Will HERO, RN Outcome: Adequate for Discharge 07/20/2024 1002 by Johnie Will HERO, RN Outcome: Progressing 07/20/2024 0742 by Johnie Will HERO, RN Outcome: Progressing

## 2024-07-20 NOTE — Progress Notes (Signed)
   07/20/24 1000  Spiritual Encounters  Type of Visit Initial;Declined chaplain visit  Care provided to: Patient  Reason for visit Routine spiritual support  OnCall Visit No    Chaplain responded to spiritual consult. Pt kindly asked for us  to return later.

## 2024-07-20 NOTE — Progress Notes (Signed)
 Physical Therapy Treatment Patient Details Name: Jennifer [name] MRN: [id number] DOB: 01-11-1961 Today's Date: 07/20/2024   History of Present Illness Pt is a 63 y/o F admitted on 07/14/24 after presenting with c/o AMS. MRI + L MCA territory infarct. 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, PAD, chronic back pain, depression, GERD, CKD2, urinary incontinence, gastroparesis    PT Comments  Continuing work on functional mobility and activity tolerance;  session focused on practicing options for car transfers, and problem-solving through the task with pt and her niece, Misty, in prep for dc home today; Pt was albe to transfer bed to transport chair with CGA, mostly for steadying the sliding board; OK for dc home from PT standpoint; Staff can assist pt and niece with car transfer into car; once home, pt can don prostheses for a more steady transfer car to her own wheelchair   If plan is discharge home, recommend the following: A little help with walking and/or transfers;Assistance with cooking/housework;Assist for transportation;Help with stairs or ramp for entrance;Supervision due to cognitive status   Can travel by private vehicle     Yes  Equipment Recommendations  Other (comment) (sliding board)    Recommendations for Other Services       Precautions / Restrictions Precautions Precautions: Fall Precaution/Restrictions Comments: BLE amputations     Mobility  Bed Mobility Overal bed mobility: Modified Independent                  Transfers Overall transfer level: Needs assistance   Transfers: Bed to chair/wheelchair/BSC            Lateral/Scoot Transfers: Contact guard assist, With slide board General transfer comment: Performed lateral scoot transfer uphill slightly from bed to transport chair with sliding board; showed good problem-solving as she considered angles while scooting; CGA to steady sliding board    Ambulation/Gait                   Stairs              Wheelchair Mobility     Tilt Bed    Modified Rankin (Stroke Patients Only)       Balance     Sitting balance-Leahy Scale: Good                                      Communication Communication Communication: No apparent difficulties;Impaired  Cognition Arousal: Alert Behavior During Therapy: WFL for tasks assessed/performed   PT - Cognitive impairments: No apparent impairments                         Following commands: Intact      Cueing Cueing Techniques: Verbal cues  Exercises      General Comments General comments (skin integrity, edema, etc.): Pt's God-dtr, Misty, present and helpful; helped pt don her pajama pants prior to transfer      Pertinent Vitals/Pain Pain Assessment Pain Assessment: Faces Faces Pain Scale: No hurt Pain Intervention(s): Monitored during session    Home Living                          Prior Function            PT Goals (current goals can now be found in the care plan section) Acute Rehab PT Goals Patient Stated Goal:  Home today PT Goal Formulation: With patient Time For Goal Achievement: 07/29/24 Potential to Achieve Goals: Good Progress towards PT goals: Progressing toward goals    Frequency    Min 2X/week      PT Plan      Co-evaluation              AM-PAC PT 6 Clicks Mobility   Outcome Measure  Help needed turning from your back to your side while in a flat bed without using bedrails?: None Help needed moving from lying on your back to sitting on the side of a flat bed without using bedrails?: None Help needed moving to and from a bed to a chair (including a wheelchair)?: A Little Help needed standing up from a chair using your arms (e.g., wheelchair or bedside chair)?: Total Help needed to walk in hospital room?: Total Help needed climbing 3-5 steps with a railing? : Total 6 Click Score: 14    End of Session Equipment Utilized During Treatment:  Other (comment) (sliding board) Activity Tolerance: Patient tolerated treatment well Patient left: in chair;with call bell/phone within reach;Other (comment) (neice present, readying for dc) Nurse Communication: Mobility status;Other (comment) (and how to assist pt best with car transfer) PT Visit Diagnosis: Other abnormalities of gait and mobility (R26.89)     Time: 8699-8652 PT Time Calculation (min) (ACUTE ONLY): 47 min  Charges:    $Therapeutic Activity: 38-52 mins PT General Charges $$ ACUTE PT VISIT: 1 Visit                     Silvano Currier, PT  Acute Rehabilitation Services Office [phone] Secure Chat welcomed    [name] 07/20/2024, 2:31 PM

## 2024-07-20 NOTE — Progress Notes (Signed)
 Son is supposed to take patient home at discharge. Son has been contacted with no answer. Phone going straight to voicemail.

## 2024-07-20 NOTE — Plan of Care (Signed)

## 2024-07-20 NOTE — Plan of Care (Signed)
 Problem: Education: Goal: Ability to describe self-care measures that may prevent or decrease complications (Diabetes Survival Skills Education) will improve Outcome: Progressing Goal: Individualized Educational Video(s) Outcome: Progressing   Problem: Coping: Goal: Ability to adjust to condition or change in health will improve Outcome: Progressing   Problem: Fluid Volume: Goal: Ability to maintain a balanced intake and output will improve Outcome: Progressing   Problem: Health Behavior/Discharge Planning: Goal: Ability to identify and utilize available resources and services will improve Outcome: Progressing Goal: Ability to manage health-related needs will improve Outcome: Progressing   Problem: Metabolic: Goal: Ability to maintain appropriate glucose levels will improve Outcome: Progressing   Problem: Nutritional: Goal: Maintenance of adequate nutrition will improve Outcome: Progressing Goal: Progress toward achieving an optimal weight will improve Outcome: Progressing   Problem: Skin Integrity: Goal: Risk for impaired skin integrity will decrease Outcome: Progressing   Problem: Tissue Perfusion: Goal: Adequacy of tissue perfusion will improve Outcome: Progressing   Problem: Education: Goal: Knowledge of General Education information will improve Description: Including pain rating scale, medication(s)/side effects and non-pharmacologic comfort measures Outcome: Progressing   Problem: Health Behavior/Discharge Planning: Goal: Ability to manage health-related needs will improve Outcome: Progressing   Problem: Clinical Measurements: Goal: Ability to maintain clinical measurements within normal limits will improve Outcome: Progressing Goal: Will remain free from infection Outcome: Progressing Goal: Diagnostic test results will improve Outcome: Progressing Goal: Respiratory complications will improve Outcome: Progressing Goal: Cardiovascular complication will  be avoided Outcome: Progressing   Problem: Activity: Goal: Risk for activity intolerance will decrease Outcome: Progressing   Problem: Nutrition: Goal: Adequate nutrition will be maintained Outcome: Progressing   Problem: Coping: Goal: Level of anxiety will decrease Outcome: Progressing   Problem: Elimination: Goal: Will not experience complications related to bowel motility Outcome: Progressing Goal: Will not experience complications related to urinary retention Outcome: Progressing   Problem: Pain Managment: Goal: General experience of comfort will improve and/or be controlled Outcome: Progressing   Problem: Safety: Goal: Ability to remain free from injury will improve Outcome: Progressing   Problem: Skin Integrity: Goal: Risk for impaired skin integrity will decrease Outcome: Progressing   Problem: Education: Goal: Knowledge of disease or condition will improve 07/20/2024 1002 by Johnie Will HERO, RN Outcome: Progressing 07/20/2024 0742 by Johnie Will HERO, RN Outcome: Progressing Goal: Knowledge of secondary prevention will improve (MUST DOCUMENT ALL) 07/20/2024 1002 by Johnie Will HERO, RN Outcome: Progressing 07/20/2024 0742 by Johnie Will HERO, RN Outcome: Progressing Goal: Knowledge of patient specific risk factors will improve (DELETE if not current risk factor) 07/20/2024 1002 by Johnie Will HERO, RN Outcome: Progressing 07/20/2024 0742 by Johnie Will HERO, RN Outcome: Progressing   Problem: Ischemic Stroke/TIA Tissue Perfusion: Goal: Complications of ischemic stroke/TIA will be minimized 07/20/2024 1002 by Johnie Will HERO, RN Outcome: Progressing 07/20/2024 0742 by Johnie Will HERO, RN Outcome: Progressing   Problem: Coping: Goal: Will verbalize positive feelings about self 07/20/2024 1002 by Johnie Will HERO, RN Outcome: Progressing 07/20/2024 0742 by Johnie Will HERO, RN Outcome: Progressing Goal: Will identify  appropriate support needs 07/20/2024 1002 by Johnie Will HERO, RN Outcome: Progressing 07/20/2024 0742 by Johnie Will HERO, RN Outcome: Progressing   Problem: Health Behavior/Discharge Planning: Goal: Ability to manage health-related needs will improve 07/20/2024 1002 by Johnie Will HERO, RN Outcome: Progressing 07/20/2024 0742 by Johnie Will HERO, RN Outcome: Progressing Goal: Goals will be collaboratively established with patient/family 07/20/2024 1002 by Johnie Will HERO, RN Outcome: Progressing 07/20/2024 0742 by Johnie Will HERO, RN Outcome: Progressing  Problem: Self-Care: Goal: Ability to participate in self-care as condition permits will improve 07/20/2024 1002 by Johnie Will HERO, RN Outcome: Progressing 07/20/2024 0742 by Johnie Will HERO, RN Outcome: Progressing Goal: Verbalization of feelings and concerns over difficulty with self-care will improve 07/20/2024 1002 by Johnie Will HERO, RN Outcome: Progressing 07/20/2024 0742 by Johnie Will HERO, RN Outcome: Progressing Goal: Ability to communicate needs accurately will improve 07/20/2024 1002 by Johnie Will HERO, RN Outcome: Progressing 07/20/2024 0742 by Johnie Will HERO, RN Outcome: Progressing   Problem: Nutrition: Goal: Risk of aspiration will decrease 07/20/2024 1002 by Johnie Will HERO, RN Outcome: Progressing 07/20/2024 0742 by Johnie Will HERO, RN Outcome: Progressing Goal: Dietary intake will improve 07/20/2024 1002 by Johnie Will HERO, RN Outcome: Progressing 07/20/2024 0742 by Johnie Will HERO, RN Outcome: Progressing

## 2024-07-20 NOTE — Discharge Instructions (Addendum)
 Jennifer [name],  You were recently admitted to [facility] for confusion and new stroke.   Continue taking your home medications with the following changes  Start taking: Aspirin  81mg  daily Depakote  500mg  daily Plavix  (clopidogrel ) 75mg  daily for the next three weeks, then stop. Change how you take: Oxycodone , this will now be 5mg  every 4 hours as needed for pain Lyrica  (pregabalin ), this will now be 25mg  three times daily Tresiba , this will now be 26 units daily instead of 65. Stop taking: Baclofen  5mg  Insulin  Lantus  65units Robaxin  (methocarbamol ) 500mg  Metoprolol  100mg  Oxybutynin  10mg  Trazodone  100mg  Continue taking your other medications   You should seek further medical care if you develop new, sharp headaches or you develop any new weaknesses to your body.  You have an appointment with our clinic tomorrow, July 31st at 1:45PM, with Dr. Dorinda. You have an appointment with Dr. Trudy on August 21st, at 9:15AM.  We are so glad that you are feeling better.  Sincerely, Burhan Barham, DO

## 2024-07-20 NOTE — Discharge Summary (Addendum)
 Name: Jennifer [name] MRN: [id number] DOB: 12-Aug-1961 63 y.o. PCP: Trudy Mliss Dragon, MD  Date of Admission: 07/14/2024  2:38 PM Date of Discharge: 07/20/2024  Attending Physician: Dr. 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   Peripheral arterial disease with history of revascularization (HCC)   Urinary tract infection without hematuria   CKD stage 2 due to type 2 diabetes mellitus (HCC)   Encephalopathy   Urinary incontinence   COPD (chronic obstructive pulmonary disease) (HCC)   Type 2 diabetes mellitus (HCC)   Hypokalemia   Renal lesion    Discharge Medications: Allergies as of 07/20/2024       Reactions   Benazepril  Swelling   Possible tongue swelling 06/14/2021   Abilify  [aripiprazole ] Other (See Comments)   Urinary freq Nov 2016   Beef-derived Drug Products Diarrhea   Chantix  [varenicline ] Other (See Comments)   Unknown reaction   Iohexol Other (See Comments)   IV CONTRAST CAUSED TRANSIENT NEPHROPATHY (KIDNEY INSUFFICIENCY) IN 2007   Ivp Dye [iodinated Contrast Media] Other (See Comments)   IV CONTRAST CAUSED TRANSIENT NEPHROPATHY (KIDNEY INSUFFICIENCY) IN 2007   Morphine Sulfate Other (See Comments)   Unknown reaction        Medication List     PAUSE taking these medications    metoCLOPramide  5 MG tablet Wait to take this until your doctor or other care provider tells you to start again. Commonly known as: Reglan  Take 1 tablet (5 mg total) by mouth 3 (three) times daily before meals.   Myrbetriq  50 MG Tb24 tablet Wait to take this until your doctor or other care provider tells you to start again. Generic drug: mirabegron  ER Take 1  tablet (50 mg total) by mouth daily.       STOP taking these medications    Baclofen  5 MG Tabs   Lantus  SoloStar 100 UNIT/ML Solostar Pen Generic drug: insulin  glargine   methocarbamol  500 MG tablet Commonly known as: ROBAXIN    metoprolol  succinate 100 MG 24 hr tablet Commonly known as: TOPROL -XL   oxybutynin  10 MG 24 hr tablet Commonly known as: DITROPAN -XL   traZODone  100 MG tablet Commonly known as: DESYREL        TAKE these medications    Accu-Chek Guide Test test strip Generic drug: glucose blood Use as back up to CGM sensors up to 1 time a day   Accu-Chek Softclix Lancets lancets Use as back up to CGM sensors up to 1 time a day   acetaminophen  650 MG CR tablet Commonly known as: TYLENOL  Take 2 tablets (1,300 mg total) by mouth every 8 (eight) hours as needed for pain. What changed: when to take this   albuterol  108 (90 Base) MCG/ACT inhaler Commonly known as: ProAir  HFA Inhale 2 puffs into the lungs every 6 (six) hours as needed for wheezing.   amLODipine  10 MG tablet Commonly known as: NORVASC  Take 1 tablet (10 mg total) by mouth daily.   Anoro Ellipta  62.5-25 MCG/ACT Aepb Generic drug: umeclidinium-vilanterol Inhale 1 puff into the lungs in the morning.   aspirin  EC 81 MG tablet Take 1 tablet (81 mg total) by mouth daily. Swallow whole.   clopidogrel  75 MG tablet Commonly known as:  PLAVIX  Take 1 tablet (75 mg total) by mouth daily. Start taking on: July 21, 2024   Dexcom G7 Sensor Misc Place new sensor every 10 days. Use to monitor blood sugar continuously.   divalproex  500 MG 24 hr tablet Commonly known as: DEPAKOTE  ER Take 1 tablet (500 mg total) by mouth daily.   DULoxetine  60 MG capsule Commonly known as: CYMBALTA  Take 1 capsule (60 mg total) by mouth daily.   fluticasone  50 MCG/ACT nasal spray Commonly known as: FLONASE  Place 1 spray into both nostrils 2 (two) times daily for allergies.   lidocaine  5 % Commonly known as:  Lidoderm  Place 1 patch onto the skin daily. Remove & Discard patch within 12 hours or as directed by MD   metFORMIN  500 MG 24 hr tablet Commonly known as: GLUCOPHAGE -XR Take 1 tablet (500 mg total) by mouth daily with breakfast.   ondansetron  4 MG tablet Commonly known as: ZOFRAN  Take 1 tablet (4 mg total) by mouth every 8 (eight) hours as needed for nausea and vomiting.   oxyCODONE  5 MG immediate release tablet Commonly known as: Oxy IR/ROXICODONE  Take 0.5 tablets (2.5 mg total) by mouth every 4 (four) hours as needed for severe pain (pain score 7-10). What changed: how much to take   Ozempic  (1 MG/DOSE) 4 MG/3ML Sopn Generic drug: Semaglutide  (1 MG/DOSE) Inject 1 mg into the skin once a week.   pantoprazole  40 MG tablet Commonly known as: PROTONIX  Take 1 tablet (40 mg total) by mouth daily for GERD.   pregabalin  25 MG capsule Commonly known as: LYRICA  Take 1 capsule (25 mg total) by mouth 3 (three) times daily. What changed:  medication strength how much to take Another medication with the same name was removed. Continue taking this medication, and follow the directions you see here.   rosuvastatin  20 MG tablet Commonly known as: CRESTOR  Take 1 tablet (20 mg total) by mouth in the morning.   spironolactone  50 MG tablet Commonly known as: ALDACTONE  Take 1 tablet (50 mg total) by mouth daily.   SSD 1 % cream Generic drug: silver  sulfADIAZINE  Apply 1 Application topically daily. To the wounds on your leg and foot   TechLite Pen Needles 32G X 4 MM Misc Generic drug: Insulin  Pen Needle Use to inject insulin  4 (four) times daily.   Tresiba  FlexTouch 200 UNIT/ML FlexTouch Pen Generic drug: insulin  degludec Inject 26 Units into the skin daily. What changed: how much to take               Durable Medical Equipment  (From admission, onward)           Start     Ordered   07/20/24 1325  For home use only DME Other see comment  Once       Comments: Transfer  Board- Long.  Question:  Length of Need  Answer:  Lifetime   07/20/24 1324            Disposition and follow-up:   Jennifer [name] was discharged from [facility] in Good condition.  At the hospital follow up visit please address:  1.  Follow-up:  a. Ensure close blood sugar monitoring with large change in long acting insulin  dose.    b. Re-evaluate pain and need for changes in pain medication. We significantly decreased her lyrica , halved her oxycodone , and stopped her robaxin  and baclofen .   c. Check for signs of gastroparesis and urinary incontinence off metoclopramide  and mirabegron    d.  Reperform medication reconciliation with home meds brought into the clinic.  E. Right Renal Lesion: noted on CT abdomen. F/u MRI abdomen with and without contrast recommended for further characterization.   Follow-up Appointments:  Follow-up Information     Norrine Sharper, MD. Go on 07/21/2024.   Specialty: Internal Medicine Why: Appointment on 07/21/2024 1:45 PM Contact information: [address] [place] [address] [phone]         [facility], [facility] Follow up.   Specialty: Home Health Services Why: Somene will call you to schedule first appointment. Contact information: [address] [place] [address] [phone]                 Hospital Course by problem list: Jennifer [name] is a 63 y.o. person living with a history of COPD, HLD, DM who presented with confusion and admitted for encephalopathy.   #Labile mood vs Encephalopathy, unclear etiology  #Delirium  #UTI Presented with confusion and AMS from home. Encephalopathy workup mostly negative with normal renal and hepatic function. TSH mildly decreased with minimally elevated free T4. No acidosis. CT head without acute. UDS did note THC. Patient is on several centrally acting medications and some changes after recently discharge from SNF. She was  A&Ox3 but distraught, tearful, and inattentive on admission. Remained HDS and has been afebrile with no leukocytosis. Asymptomatic bacteruria but did empirically treat with CTX given presentation. Blood cultures negative. Labile mood improved.  MRI brain showed acute posterior left MCA infarct, predominantly involving the posterior insula and overlying left parietal lobe. Neurology and stroke team consulted. Started patient on Aspirin  and Plavix  and began embolic stroke workup. This included MR Angio Head, TEE, bilateral carotid doppler ultrasound, and working up risk factors like A1c and LDL. LDL 36. Patient was started on Depakote  500mg  and headache cocktail for new headache post-stroke. MR Angio Head showed mild attenuation of distal MCA branch vessels, worse on the left. She had moderate narrowing in the distal PCA branch vessels bilaterally. Echo showed EF of 65-75% with mild left ventricular hypertrophy and no valvular involvement. Carotid dopplers showed normal to mild stenosis in the bilateral ICAs, but otherwise normal. TEE was completed and shows right-to-left interatrial shunting, with a ~2.0cm small patent foramen ovale. Cardiology consulted for PFO and does not recommend outpatient follow up for potential closure. Negligible risk for stroke per neurology. Lower extremity DVT US  and US  bubble study ordered. 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 Transcranial doppler ultrasound was performed but was largely undiagnostic due to suboptimal windows, showing no evidence of significant PFO during the exam. Mood is improved without tearful affect. PT/OT evaluated and recommend home health PT/OT.  SLP evaluated and recommended thin diet and continued SLP services. Centrally acting medications were held throughout this hospitalization, except for Duloxetine , Pregabalin , and Oxycodone . She is to follow up with  Dr. Eather Engman clinic in 2 months from discharge date.     #Major depression in partial remission (HCC) Initially held home duloxetine . Patient's mental state could be playing apart in her presentation. Denies any changes in home environment or acute issues with son. Did leave SNF about 1 month ago and noted by son to have depressed mood and not herself which she told him felt like she was in shock when she was surrounded by people at SNF with similar conditions/situations. Son reports at home that patient was  staring off into space, yelling at people, and for the few days prior to admission was refusing to eat and drink after returning SNF. Questioned if this is contributing given labile affect. Restarted home duloxetine  60 mg daily. Patient's mood seemed to improve/stabilize.   #Hypertension  BP mildly hypertensive. Continue home amlodipine  10 mg, spironolactone  50 mg daily. Blood pressures remained <220/110 throughout hospitalization.    #Polypharmacy  Patient is on many centrally-acting medications and those medications could have changed when leaving nursing facility. Med rec was done, and the majority of her centrally acting agents were held during this hospitalization. Exceptions included Duloxetine , Pregabalin , and Oxycodone . Upon discharge, I will hold her Trazodone  100mg , Metoclopramide  5mg , Mirabegron  50mg , Baclofen  5mg , and Methocarbamol  500mg . The patient had no complaints of nausea or vomiting, any symptoms of overactive bladder, and states her pain was moderately controlled on reduced dosing of Pregabalin  and Oxycodone .    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 Reports bilateral amputation site pain that she reports is her chronic pain. Noted her pregabalin  dose was increased recently to 225 mg TID from 200 mg TID per PDMP report. Restarted pregabalin  at 25mg  TID. Oxycodone  2.5mg  IR was restarted for pain.  Patient reported moderate improvement of her pain today on lowered Oxycodone  dose.    #Right Renal Lesion  A 5cm heterogeneous lesion in the midpole of the right kidney was incidentally found on CT Abdomen and Pelvis. This could represent a proteinaceous cyst or a solid renal mass. This requires MRI Abdomen with and without contrast to further characterize. A follow up in outpatient clinic about this needed to further work-up this lesion.   #Urinary Incontinence Patient is prescribed Myrbetriq  and Oxybutynin . Will hold these medications at this time. Bladder scan so far not retaining any urine. Completed 3 days of Ceftriaxone  for presumed UTI.  Patient reported no dysuria or worsening urinary symptoms.    #Decreased TSH  Mildly decreased TSH, free T4 mildly elevated. Less suspicious that this is contributing to current presentation. This can be followed up outpatient.   #Type 2 Diabetes Mellitus A1c 9.8. PTA on long acting insulin  reported 65 units daily, metformin  500 mg daily and Ozempic  1 mg weekly. Placed on 20 units Semglee  while inpatient, which was raised to 25 units. Patient was discharged on insulin  25 units.    Stable Chronic Conditions:  GERD- continue home pantoprazole  40 mg  CKD stage 2 due to Type 2 DM- stable/at baseline  Hyperlipidemia associated with T2DM PAD with history of revascularization- continue home rosuvastatin  20 mg COPD- stable. continue home anora ellipta  Gastroparesis, likely secondary to diabetes- holding home Reglan  10 mg at this time     Discharge Subjective: Patient evaluated at bedside. Today she is sitting up in the bed. She reports that she is feeling less confused today. She reports that her headache has also improved, she denies pain or dizziness today. She had a BM 2 days ago which was normal, but does not want anymore BM medications. She reports that at home, she typically has about 2-3 BM a day, sometimes more and sometimes less depending on the  day. She feels that she has had a normal BM schedule since she has been in the hospital. Oriented x3 to person, place and time.   Discharge Exam:   BP (!) 161/84 (BP Location: Right Arm)   Pulse 73   Temp 98.2 F (36.8 C) (Oral)   Resp  18   Ht 4' 10 (1.473 m)   Wt 84.2 kg   SpO2 100%   BMI 38.80 kg/m  Constitutional:      General: She is not in acute distress.    Appearance: Normal appearance.  HENT:     Head: Normocephalic.  Eyes:     Extraocular Movements: Extraocular movements intact.     Pupils: Pupils are equal, round, and reactive to light.  Cardiovascular:     Rate and Rhythm: Normal rate and regular rhythm.  Pulmonary:     Effort: Pulmonary effort is normal.     Breath sounds: Normal breath sounds.  Abdominal:     General: Abdomen is flat.     Palpations: Abdomen is soft.  Skin:    General: Skin is warm and dry.  Neurological:     General: No focal deficit present.     Mental Status: She is alert and oriented to person, place, and time.  Psychiatric:        Mood and Affect: Mood normal.        Behavior: Behavior normal.    Pertinent Labs, Studies, and Procedures:     Latest Ref Rng & Units 07/20/2024   12:46 PM 07/19/2024    5:02 AM 07/18/2024    6:06 AM  CBC  WBC 4.0 - 10.5 K/uL 7.8  6.6  7.1   Hemoglobin 12.0 - 15.0 g/dL 88.5  89.2  89.5   Hematocrit 36.0 - 46.0 % 36.6  35.2  32.9   Platelets 150 - 400 K/uL 200  184  166        Latest Ref Rng & Units 07/20/2024   12:46 PM 07/19/2024    5:02 AM 07/18/2024    6:06 AM  CMP  Glucose 70 - 99 mg/dL 697  768  811   BUN 8 - 23 mg/dL 14  9  <5   Creatinine 0.44 - 1.00 mg/dL 9.12  9.12  9.12   Sodium 135 - 145 mmol/L 132  137  137   Potassium 3.5 - 5.1 mmol/L 4.8  4.9  4.0   Chloride 98 - 111 mmol/L 101  104  104   CO2 22 - 32 mmol/L 22  23  22    Calcium  8.9 - 10.3 mg/dL 9.2  9.1  8.9     CT Head Wo Contrast Result Date: 07/14/2024 CLINICAL DATA:  Provided history: Mental status change, unknown cause  EXAM: CT HEAD WITHOUT CONTRAST TECHNIQUE: Contiguous axial images were obtained from the base of the skull through the vertex without intravenous contrast. RADIATION DOSE REDUCTION: This exam was performed according to the departmental dose-optimization program which includes automated exposure control, adjustment of the mA and/or kV according to patient size and/or use of iterative reconstruction technique. COMPARISON:  10/31/2023 FINDINGS: Brain: No intracranial hemorrhage, mass effect, or midline shift. Stable brain volume. No hydrocephalus. The basilar cisterns are patent. Stable periventricular white matter hypodensity. No evidence of territorial infarct or acute ischemia. No extra-axial or intracranial fluid collection. Vascular: Atherosclerosis of skullbase vasculature without hyperdense vessel or abnormal calcification. Skull: Paranasal sinuses and mastoid air cells are clear. The visualized orbits are unremarkable. Sinuses/Orbits: Paranasal sinuses and mastoid air cells are clear. The visualized orbits are unremarkable. Other: None. IMPRESSION: 1. No acute intracranial abnormality. 2. Stable periventricular white matter hypodensity. Electronically Signed   By: Andrea Gasman M.D.   On: 07/14/2024 18:35   CT ABDOMEN PELVIS WO CONTRAST Result Date: 07/14/2024 CLINICAL DATA:  Acute abdominal  pain for several days. EXAM: CT ABDOMEN AND PELVIS WITHOUT CONTRAST TECHNIQUE: Multidetector CT imaging of the abdomen and pelvis was performed following the standard protocol without IV contrast. RADIATION DOSE REDUCTION: This exam was performed according to the departmental dose-optimization program which includes automated exposure control, adjustment of the mA and/or kV according to patient size and/or use of iterative reconstruction technique. COMPARISON:  03/15/2024 FINDINGS: Lower chest: No acute findings. Hepatobiliary: No mass visualized on this unenhanced exam. Gallbladder is unremarkable. No evidence of  biliary ductal dilatation. Pancreas: No mass or inflammatory process visualized on this unenhanced exam. Spleen:  Within normal limits in size. Adrenals/Urinary tract: No evidence of urolithiasis or hydronephrosis. A lesion with heterogeneous attenuation is seen in the midpole the right kidney which measures 5 cm. This appears increased in size from 3 cm in 2023. This cannot be characterized on this unenhanced exam, and could represent a proteinaceous cyst or solid renal mass. Unremarkable unopacified urinary bladder. Stomach/Bowel: No evidence of obstruction, inflammatory process, or abnormal fluid collections. Normal appendix visualized. Mild colonic diverticulosis again noted, without signs of diverticulitis. Vascular/Lymphatic: No pathologically enlarged lymph nodes identified. No evidence of abdominal aortic aneurysm. Reproductive: Prior hysterectomy noted. Adnexal regions are unremarkable in appearance. Other:  None. Musculoskeletal:  No suspicious bone lesions identified. IMPRESSION: No evidence of urolithiasis, hydronephrosis, or other acute findings. Increased size of 5 cm heterogeneous lesion in midpole of right kidney, which cannot be characterized on this unenhanced exam. This could represent a proteinaceous cyst or solid renal mass. Abdomen MRI without and with contrast is recommended for further characterization. Colonic diverticulosis, without radiographic evidence of diverticulitis. Electronically Signed   By: Norleen DELENA Kil M.D.   On: 07/14/2024 18:22     Discharge Instructions:   Discharge Instructions      Brittanni, Cariker were recently admitted to [facility] for confusion and new stroke.   Continue taking your home medications with the following changes  Start taking: Aspirin  81mg  daily Depakote  500mg  daily Plavix  (clopidogrel ) 75mg  daily for the next three weeks, then stop. Change how you take: Oxycodone , this will now be 5mg  every 4 hours as needed for  pain Lyrica  (pregabalin ), this will now be 25mg  three times daily Tresiba , this will now be 26 units daily instead of 65. Stop taking: Baclofen  5mg  Insulin  Lantus  65units Robaxin  (methocarbamol ) 500mg  Metoprolol  100mg  Oxybutynin  10mg  Trazodone  100mg  Continue taking your other medications   You should seek further medical care if you develop new, sharp headaches or you develop any new weaknesses to your body.  You have an appointment with our clinic tomorrow, July 31st at 1:45PM, with Dr. Dorinda. You have an appointment with Dr. Trudy on August 21st, at 9:15AM.  We are so glad that you are feeling better.  Sincerely, Lynell Kussman, DO     Signed: Jesalyn Finazzo, DO Internal Medicine Resident, PGY-3 07/20/2024, 7:09 PM   Please contact the on call pager after 5 pm and on weekends at [phone].

## 2024-07-20 NOTE — Progress Notes (Deleted)
 HD#5 SUBJECTIVE:  Patient Summary: Jennifer [name] is a 63 y.o. person living with a history of COPD, HLD, T2DM who presented with confusion and admitted for encephalopathy.   Overnight Events: Bladder scan overnight showed PVR 0mL.  Interim History: Patient evaluated at bedside. Today she is sitting up in the bed. She reports that she is feeling less confused today. She reports that her headache has also improved, she denies pain or dizziness today. She had a BM 2 days ago which was normal, but does not want anymore BM medications. She reports that at home, she typically has about 2-3 BM a day, sometimes more and sometimes less depending on the day. She feels that she has had a normal BM schedule since she has been in the hospital. Oriented x3 to person, place and time.    OBJECTIVE:  Vital Signs: Vitals:   07/19/24 1510 07/19/24 2145 07/20/24 0432 07/20/24 0722  BP: (!) 157/74 132/75 (!) 149/68 (!) 161/84  Pulse: 87 77 84 73  Resp:  19 16 18   Temp: 98.3 F (36.8 C) 98.1 F (36.7 C) 98.2 F (36.8 C) 98.2 F (36.8 C)  TempSrc: Oral Oral Oral Oral  SpO2: 100% 100% 98% 100%  Weight:   84.2 kg   Height:       Supplemental O2: Room Air SpO2: 100 % O2 Flow Rate (L/min): 4 L/min  Filed Weights   07/14/24 1458 07/17/24 0400 07/20/24 0432  Weight: 99 kg 88.8 kg 84.2 kg     Intake/Output Summary (Last 24 hours) at 07/20/2024 1017 Last data filed at 07/20/2024 0900 Gross per 24 hour  Intake 840 ml  Output 2400 ml  Net -1560 ml   Net IO Since Admission: 1,737.79 mL [07/20/24 1017]  Physical Exam: Physical Exam Constitutional:      General: She is not in acute distress.    Appearance: Normal appearance.  HENT:     Head: Normocephalic.  Eyes:     Extraocular Movements: Extraocular movements intact.     Pupils: Pupils are equal, round, and reactive to light.  Cardiovascular:     Rate and Rhythm: Normal rate and regular rhythm.  Pulmonary:     Effort: Pulmonary effort is  normal.     Breath sounds: Normal breath sounds.  Abdominal:     General: Abdomen is flat.     Palpations: Abdomen is soft.  Skin:    General: Skin is warm and dry.  Neurological:     General: No focal deficit present.     Mental Status: She is alert and oriented to person, place, and time.  Psychiatric:        Mood and Affect: Mood normal.        Behavior: Behavior normal.     Patient Lines/Drains/Airways Status     Active Line/Drains/Airways     Name Placement date Placement time Site Days   Peripheral IV 07/18/24 22 G Anterior;Right Forearm 07/18/24  1055  Forearm  1   Wound / Incision (Open or Dehisced) 11/07/20 (MASD) Moisture Associated Skin Damage Buttocks Right;Left;Bilateral MASD redness bilateral buttocks 11/07/20  1800  Buttocks  1350   Wound / Incision (Open or Dehisced) 03/25/24 Venous stasis ulcer Leg Right Full thickness R lower leg 03/25/24  0800  Leg  116   Wound / Incision (Open or Dehisced) 03/25/24 Other (Comment) Right R foot necrotic tissue 03/25/24  0800  --  116            Pertinent  labs and imaging:      Latest Ref Rng & Units 07/19/2024    5:02 AM 07/18/2024    6:06 AM 07/17/2024   12:42 PM  CBC  WBC 4.0 - 10.5 K/uL 6.6  7.1  6.9   Hemoglobin 12.0 - 15.0 g/dL 89.2  89.5  88.6   Hematocrit 36.0 - 46.0 % 35.2  32.9  36.7   Platelets 150 - 400 K/uL 184  166  197        Latest Ref Rng & Units 07/19/2024    5:02 AM 07/18/2024    6:06 AM 07/17/2024    9:28 AM  CMP  Glucose 70 - 99 mg/dL 768  811  747   BUN 8 - 23 mg/dL 9  <5  <5   Creatinine 0.44 - 1.00 mg/dL 9.12  9.12  9.00   Sodium 135 - 145 mmol/L 137  137  136   Potassium 3.5 - 5.1 mmol/L 4.9  4.0  4.5   Chloride 98 - 111 mmol/L 104  104  105   CO2 22 - 32 mmol/L 23  22  20    Calcium  8.9 - 10.3 mg/dL 9.1  8.9  9.0     VAS US  LOWER EXTREMITY VENOUS (DVT) Result Date: 07/19/2024  Lower Venous DVT Study Patient Name:  Jennifer [name]  Date of Exam:   07/19/2024 Medical Rec #: [id number]         Accession #:    [id number] Date of Birth: [birth year]        Patient Gender: F Patient Age:   14 years Exam Location:  [facility] Procedure:      VAS US  LOWER EXTREMITY VENOUS (DVT) Referring Phys: EARLE DE LA TORRE --------------------------------------------------------------------------------  Indications: Stroke.  Limitations: Poor ultrasound/tissue interface and BLE BKAs. Comparison Study: Previous LLEV 09/16/2013 & RLEV 03/20/2023 were both negative                   for DVT Performing Technologist: Ezzie Potters RVT, RDMS  Examination Guidelines: A complete evaluation includes B-mode imaging, spectral Doppler, color Doppler, and power Doppler as needed of all accessible portions of each vessel. Bilateral testing is considered an integral part of a complete examination. Limited examinations for reoccurring indications may be performed as noted. The reflux portion of the exam is performed with the patient in reverse Trendelenburg.  +---------+---------------+---------+-----------+----------+-------------------+ RIGHT    CompressibilityPhasicitySpontaneityPropertiesThrombus Aging      +---------+---------------+---------+-----------+----------+-------------------+ CFV      Full           Yes      Yes                                      +---------+---------------+---------+-----------+----------+-------------------+ SFJ      Full                                                             +---------+---------------+---------+-----------+----------+-------------------+ FV Prox  Full           Yes      Yes                                      +---------+---------------+---------+-----------+----------+-------------------+  FV Mid   Full           Yes      Yes                                      +---------+---------------+---------+-----------+----------+-------------------+ FV DistalFull                                         Not well visualized  +---------+---------------+---------+-----------+----------+-------------------+ PFV      Full                                                             +---------+---------------+---------+-----------+----------+-------------------+ POP      Full           Yes      Yes                  Not well visualized +---------+---------------+---------+-----------+----------+-------------------+ PTV                                                   BKA                 +---------+---------------+---------+-----------+----------+-------------------+ PERO                                                  BKA                 +---------+---------------+---------+-----------+----------+-------------------+   +---------+---------------+---------+-----------+----------+-------------------+ LEFT     CompressibilityPhasicitySpontaneityPropertiesThrombus Aging      +---------+---------------+---------+-----------+----------+-------------------+ CFV      Full           Yes      Yes                                      +---------+---------------+---------+-----------+----------+-------------------+ SFJ      Full                                                             +---------+---------------+---------+-----------+----------+-------------------+ FV Prox  Full           Yes      Yes                                      +---------+---------------+---------+-----------+----------+-------------------+ FV Mid   Full           Yes      Yes  Not well visualized +---------+---------------+---------+-----------+----------+-------------------+ FV DistalFull           Yes      Yes                  Not well visualized +---------+---------------+---------+-----------+----------+-------------------+ PFV      Full                                                             +---------+---------------+---------+-----------+----------+-------------------+  POP      Full           Yes      Yes                                      +---------+---------------+---------+-----------+----------+-------------------+ PTV                                                   BKA                 +---------+---------------+---------+-----------+----------+-------------------+ PERO                                                  BKA                 +---------+---------------+---------+-----------+----------+-------------------+    Summary: BILATERAL: - There is no evidence of deep vein thrombosis in the lower extremity. However, portions of this examination were limited- see technologist comments above.  *See table(s) above for measurements and observations.    Preliminary    VAS US  TRANSCRANIAL DOPPLER W BUBBLES Result Date: 07/19/2024  Transcranial Doppler with Bubble Patient Name:  Jennifer [name]  Date of Exam:   07/19/2024 Medical Rec #: [id number]        Accession #:    [id number] Date of Birth: 03/08/1961        Patient Gender: F Patient Age:   35 years Exam Location:  [facility] Procedure:      VAS US  TRANSCRANIAL DOPPLER W BUBBLES Referring Phys: EARLE DE LA TORRE --------------------------------------------------------------------------------  Indications: Stroke. History: TEE from earlier today (07/19/2024) was positive for PFO. Limitations for diagnostic windows: Unable to insonate right transtemporal window. Unable to insonate left transtemporal window. Comparison Study: No previous exams Performing Technologist: Jody Hill RVT, RDMS  Examination Guidelines: A complete evaluation includes B-mode imaging, spectral Doppler, color Doppler, and power Doppler as needed of all accessible portions of each vessel. Bilateral testing is considered an integral part of a complete examination. Limited examinations for reoccurring indications may be performed as noted.  Summary:  A vascular evaluation was performed. The right opthalmic artery was studied.  An IV was inserted into the patient's right forearm. Verbal informed consent was obtained.  Largely undiagnostic study due to suboptimal windows. No evidence of significant PFO seen on this exam. *See table(s) above for TCD measurements and observations.    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   Plan: #Acute Left Posterior MCA Infarct #Encephalopathy, resolved #Delirium, resolved Stroke team continuing to follow. Depakote  500mg  daily and headache cocktail for symptomatic management. Patient on Lovenox  for VTE prophylaxis. TEE performed yesterday showed right-to-left interatrial shunting, with a ~2.0cm small patent foramen ovale. Cardiology consulted for PFO and recommends outpatient follow up. Neurology says risk of stroke from PFO is negligible. Bilateral Lower Extremity Ultrasounds showed no evidence of deep vein thrombosis in the bilateral lower extremities. Transcranial doppler ultrasound was performed but was largely undiagnostic due to suboptimal windows, showing no evidence of significant PFO during the exam. PT unable to see yesterday because patient was receiving US  LE DVT. Mood  is improved without tearful affect. Discharge likely today. - Cardiology to follow up outpatient - Depakote  500mg  daily - Continue ASA and Plavix  - Follow up AM BMP and CBC  #Major Depression in partial remission Was restarted on home duloxetine  60mg  daily. No overnight events. Will monitor patient's confusion and hold medication if worsened encephalopathy. - Continue home duloxetine  60mg .  #Hypertension BP mildly hypertensive 149/68. Blood pressure goal < 220/110.  - Continue home amlodipine  10mg , spironolactone  50mg  daily  #Polypharmacy Patient on many centrally-acting medications, including Baclofen , Robaxin , Myrbetriq , Oxybutynin , Trazodone , and Oxycodone . These could have changed when leaving nursing facility. May be affecting mental state on top of acute stroke. Needs medication reconciliation during this stay. Patient's home duloxetine  resumed, and pregabalin  resumed at lower dose. Oxycodone  is also resumed at a lower dose of 2.5mg . Will continue to monitor throughout this hospitalization.   #Chronic Back Pain due to lumbar radiculopathy Patient having continued pain since holding home oxycodone  with no relief from Tylenol . Oxycodone  2.5 IR was restarted for pain. Patient reports improvement of her pain today. - Continue Oxycodone  IR 2.5mg  Q6H PRN - Continue pregabalin  25mg  TID  #Urinary Incontinence Patient prescribed Myrbetriq  and Oxybutynin . Will continue to hold these medications. Patient had 2400cc of output yesterday. Bladder scan showed PVR of  0mL.  -Continue Q8H scan. If retaining > , perform in-and-out cath.  #Decreased TSH Mildly decreased TSH, free T4 mildly elevated. Less suspicious this is contributing to patient's presentation. Can follow up outpatient.   #Type 2 Diabetes Mellitus A1c 8.8, improved from 9.8 03/2024. Fasting CBG 230 today. Home meds include Tresiba  65 units daily, Metformin  500mg  daily, and Semaglutide  1mg  weekly. Appetite improved. Increased  patient's dose to 25 units of long acting. -CBG monitoring -Novolog  moderate SSI -Semglee  25 units daily  #Hyperlipidemia LDL 36. On home rosuvastatin  20mg  -Continue home rosuvastatin  at discharge per stroke team  #Dysphagia Patient has post-stroke dysphagia. SLP consulted and recommends continued ST services throughout hospitalization -Thin diet, advance as tolerated  #Hypomagnesemia, resolved Magnesium  previously low at 1.6,  replenished with mag sulfate 2g. Magnesium  improved 1.8, sustained at 1.8.  #Stable Chronic Conditions GERD- continue home pantoprazole  40mg  CKD Stage 2 due to Type 2 DM- stable/at baseline, monitor BMP for renal function PAD with history of revascularization- continue home revascularization 20mg  at discharge COPD- lung exam stable, continue home anora ellipta Gastroparesis, likely secondary to diabetes R Renal Lesion - 5cm mass lesion in the midpole of the right kidney. Will need to follow up with outpatient MRI   Best Practice: Diet: Regular diet IVF: Fluids: none VTE: enoxaparin  (LOVENOX ) injection 40 mg Start: 07/14/24 2030 Code: Full  Disposition planning: Therapy Recs: Home Health PT, OT, SLP  Family Contact: son DISPO: Anticipated discharge pending clinical improvement.  Signature:  Wilhelmina Hark [facility]  10:17 AM, 07/20/2024  On Call pager [phone]

## 2024-07-20 NOTE — Progress Notes (Signed)
 Occupational Therapy Treatment Patient Details Name: Jennifer [name] MRN: [id number] DOB: [birth year] Today's Date: 07/20/2024   History of present illness Pt is a 63 y/o F admitted on 07/14/24 after presenting with c/o AMS. MRI + L MCA territory infarct. 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 Declined OOB to chair, stating, I just don't feel like it. Pt continues to state she is not at baseline in cognition. Educated in compensatory strategies including enlisting family members to assist with finances, appointments and medication management. Pt receptive. Continue to recommend HHOT.      If plan is discharge home, recommend the following:  A little help with walking and/or transfers;A lot of help with bathing/dressing/bathroom;Assistance with cooking/housework;Direct supervision/assist for medications management;Direct supervision/assist for financial management;Assist for transportation;Help with stairs or ramp for entrance   Equipment Recommendations  None recommended by OT    Recommendations for Other Services      Precautions / Restrictions Precautions Precautions: Fall Precaution/Restrictions Comments: BLE amputations       Mobility Bed Mobility Overal bed mobility: Needs Assistance Bed Mobility: Sidelying to Sit, Sit to Sidelying   Sidelying to sit: Min assist     Sit to sidelying: Modified independent (Device/Increase time) General bed mobility comments: use of rail, assisted to raise trunk    Transfers                   General transfer comment: declined     Balance Overall balance assessment: Needs assistance   Sitting balance-Leahy Scale: Good                                     ADL either performed or assessed with clinical judgement   ADL Overall ADL's : Needs  assistance/impaired     Grooming: Wash/dry hands;Wash/dry face;Set up;Oral care;Sitting                                 General ADL Comments: washed face and hands and used mouthwash at EOB, declined any further ADLs or ADL transfers    Extremity/Trunk Assessment              Vision       Perception     Praxis     Communication Communication Communication: No apparent difficulties;Impaired   Cognition Arousal: Alert Behavior During Therapy: WFL for tasks assessed/performed               OT - Cognition Comments: Pt educated to have family assist her with medication management, financial management and to use strategies like note writing and calendars. Pt reports rarely cooking, encouraged use of timer or sitting with food while it is cooking.                 Following commands: Intact Following commands impaired: Follows one step commands with increased time      Cueing   Cueing Techniques: Verbal cues  Exercises      Shoulder Instructions       General Comments      Pertinent Vitals/ Pain       Pain Assessment Pain Assessment: Faces Faces Pain Scale: Hurts little more Pain Location: R shoulder, B  LEs Pain Descriptors / Indicators: Discomfort, Grimacing, Guarding Pain Intervention(s): Monitored during session, Repositioned  Home Living                                          Prior Functioning/Environment              Frequency  Min 2X/week        Progress Toward Goals  OT Goals(current goals can now be found in the care plan section)  Progress towards OT goals: Progressing toward goals  Acute Rehab OT Goals OT Goal Formulation: With patient Time For Goal Achievement: 07/30/24 Potential to Achieve Goals: Good  Plan      Co-evaluation                 AM-PAC OT 6 Clicks Daily Activity     Outcome Measure   Help from another person eating meals?: None Help from another person  taking care of personal grooming?: A Little Help from another person toileting, which includes using toliet, bedpan, or urinal?: A Lot Help from another person bathing (including washing, rinsing, drying)?: A Lot Help from another person to put on and taking off regular upper body clothing?: A Little Help from another person to put on and taking off regular lower body clothing?: A Little 6 Click Score: 17    End of Session    OT Visit Diagnosis: Muscle weakness (generalized) (M62.81);Pain;Other symptoms and signs involving cognitive function   Activity Tolerance Other (comment) (self limiting)   Patient Left in bed;with call bell/phone within reach;with bed alarm set   Nurse Communication          Time: [phone] OT Time Calculation (min): 11 min  Charges: OT General Charges $OT Visit: 1 Visit OT Treatments $Self Care/Home Management : 8-22 mins  Mliss HERO, OTR/L Acute Rehabilitation Services Office: [phone]   Kennth Mliss Helling 07/20/2024, 10:00 AM

## 2024-07-20 NOTE — Plan of Care (Signed)
   Problem: Coping: Goal: Level of anxiety will decrease Outcome: Progressing

## 2024-07-20 NOTE — Telephone Encounter (Signed)
 Deprescribing anticholinergic meds

## 2024-07-20 NOTE — TOC Transition Note (Addendum)
 Transition of Care [facility]) - Discharge Note   Patient Details  Name: Jennifer [name] MRN: [id number] Date of Birth: 1961-09-15  Transition of Care [facility]) CM/SW Contact:  Tom-Johnson, Harvest Muskrat, RN Phone Number: 07/20/2024, 12:28 PM   Clinical Narrative:     Patient is scheduled for discharge today.  Readmission Risk Assessment done. Home health info, hospital f/u and discharge instructions on AVS. Patient states she has all necessary DME's at home. Prescriptions sent to [facility] pharmacy and patient will receive meds prior discharge. Niece, Misty to transport at discharge.  No further TOC needs noted.  13:25- CM notified by PT that patient needs a transfer board. CM ordered from [employer] and [facility] to deliver to patient at bedside prior discharge.  No further TOC needs noted.        Final next level of care: Home w Home Health Services Barriers to Discharge: Barriers Resolved   Patient Goals and CMS Choice Patient states their goals for this hospitalization and ongoing recovery are:: To return home CMS Medicare.gov Compare Post Acute Care list provided to:: Patient Choice offered to / list presented to : Patient      Discharge Placement                       Discharge Plan and Services Additional resources added to the After Visit Summary for     Discharge Planning Services: CM Consult                      HH Arranged: PT, OT, RN [place] Agency: [facility] Date [facility] Agency Contacted: 07/19/24 Time HH Agency Contacted: 1115 Representative spoke with at [facility] Agency: Darleene  Social Drivers of Health (SDOH) Interventions SDOH Screenings   Food Insecurity: Patient Unable To Answer (07/15/2024)  Housing: Patient Unable To Answer (07/15/2024)  Transportation Needs: Patient Unable To Answer (07/15/2024)  Utilities: Patient Unable To Answer (07/15/2024)  Alcohol Screen: Low Risk  (03/31/2023)  Depression (PHQ2-9): High Risk (10/08/2023)  Financial Resource  Strain: High Risk (03/31/2023)  Physical Activity: Inactive (03/31/2023)  Social Connections: Patient Unable To Answer (07/15/2024)  Stress: Stress Concern Present (03/31/2023)  Tobacco Use: High Risk (07/15/2024)     Readmission Risk Interventions    07/20/2024   12:25 PM 04/07/2024   12:00 PM 03/29/2024    1:06 PM  Readmission Risk Prevention Plan  Transportation Screening Complete Complete Complete  HRI or Home Care Consult   Complete  Social Work Consult for Recovery Care Planning/Counseling   Complete  Palliative Care Screening   Not Applicable  Medication Review Oceanographer) Referral to Pharmacy Referral to Pharmacy Referral to Pharmacy  PCP or Specialist appointment within 3-5 days of discharge Complete Complete   HRI or Home Care Consult Complete Complete   SW Recovery Care/Counseling Consult Complete Complete   Palliative Care Screening Not Applicable    Skilled Nursing Facility Not Applicable

## 2024-07-20 NOTE — Progress Notes (Signed)
 PT Cancellation Note  Patient Details Name: Jennifer [name] MRN: [id number] DOB: 19-Nov-1961   Cancelled Treatment:    Reason Eval/Treat Not Completed: Other (comment)  Attempted x2 for PT session; politely declining transfer training;  Noted plan for DC today;   Silvano Currier, PT  Acute Rehabilitation Services Office [phone] Secure Chat welcomed    [name] 07/20/2024, 12:54 PM

## 2024-07-21 ENCOUNTER — Encounter: Admitting: Student

## 2024-07-21 ENCOUNTER — Telehealth: Payer: Self-pay

## 2024-07-21 ENCOUNTER — Encounter: Payer: Self-pay | Admitting: Internal Medicine

## 2024-07-21 ENCOUNTER — Telehealth: Payer: Self-pay | Admitting: Internal Medicine

## 2024-07-21 ENCOUNTER — Other Ambulatory Visit: Payer: Self-pay

## 2024-07-21 LAB — CULTURE, BLOOD (ROUTINE X 2)
Culture: NO GROWTH
Culture: NO GROWTH
Special Requests: ADEQUATE

## 2024-07-21 NOTE — Telephone Encounter (Signed)
 Patient was contacted via telephone this afternoon due to no showing her appointment at our office today.  No answer and voicemail was full.  Will mail patient a letter asking to contact us  to reschedule her appointment.

## 2024-07-21 NOTE — Transitions of Care (Post Inpatient/ED Visit) (Signed)
   07/21/2024  Name: Jennifer [name] MRN: [id number] DOB: 06/25/1961  Today's TOC FU Call Status: Today's TOC FU Call Status:: Unsuccessful Call (1st Attempt) Unsuccessful Call (1st Attempt) Date: 07/21/24  Attempted to reach the patient regarding the most recent Inpatient/ED visit.  Follow Up Plan: Additional outreach attempts will be made to reach the patient to complete the Transitions of Care (Post Inpatient/ED visit) call.   Medford Balboa, BSN, RN [place]  [facility] - [facility] RN Care Manager [phone]

## 2024-07-22 ENCOUNTER — Other Ambulatory Visit: Payer: Self-pay

## 2024-07-22 ENCOUNTER — Telehealth: Payer: Self-pay

## 2024-07-22 DIAGNOSIS — M86171 Other acute osteomyelitis, right ankle and foot: Secondary | ICD-10-CM | POA: Diagnosis not present

## 2024-07-22 DIAGNOSIS — E1143 Type 2 diabetes mellitus with diabetic autonomic (poly)neuropathy: Secondary | ICD-10-CM | POA: Diagnosis not present

## 2024-07-22 DIAGNOSIS — R2689 Other abnormalities of gait and mobility: Secondary | ICD-10-CM | POA: Diagnosis not present

## 2024-07-22 DIAGNOSIS — R279 Unspecified lack of coordination: Secondary | ICD-10-CM | POA: Diagnosis not present

## 2024-07-22 DIAGNOSIS — Z89431 Acquired absence of right foot: Secondary | ICD-10-CM | POA: Diagnosis not present

## 2024-07-22 NOTE — Transitions of Care (Post Inpatient/ED Visit) (Signed)
   07/22/2024  Name: Jennifer [name] MRN: [id number] DOB: 04-Dec-1961  Today's TOC FU Call Status: Today's TOC FU Call Status:: Unsuccessful Call (2nd Attempt) Unsuccessful Call (1st Attempt) Date: 07/21/24 Unsuccessful Call (2nd Attempt) Date: 07/22/24  Attempted to reach the patient regarding the most recent Inpatient/ED visit.  Follow Up Plan: Additional outreach attempts will be made to reach the patient to complete the Transitions of Care (Post Inpatient/ED visit) call.   Medford Balboa, BSN, RN   [facility] - [facility] RN Care Manager [phone]

## 2024-07-25 ENCOUNTER — Telehealth: Payer: Self-pay

## 2024-07-25 NOTE — Transitions of Care (Post Inpatient/ED Visit) (Signed)
   07/25/2024  Name: Jennifer [name] MRN: [id number] DOB: 11-12-1961  Today's TOC FU Call Status: Today's TOC FU Call Status:: Unsuccessful Call (3rd Attempt) Unsuccessful Call (1st Attempt) Date: 07/21/24 Unsuccessful Call (2nd Attempt) Date: 07/22/24 Unsuccessful Call (3rd Attempt) Date: 07/25/24  Attempted to reach the patient regarding the most recent Inpatient/ED visit.  Follow Up Plan: No further outreach attempts will be made at this time. We have been unable to contact the patient.  Medford Balboa, BSN, RN [place]  [facility] - [facility] RN Care Manager [phone]

## 2024-08-01 LAB — GLUCOSE, CAPILLARY: Glucose-Capillary: 208 mg/dL — ABNORMAL HIGH (ref 70–99)

## 2024-08-06 ENCOUNTER — Other Ambulatory Visit (HOSPITAL_COMMUNITY): Payer: Self-pay

## 2024-08-08 ENCOUNTER — Other Ambulatory Visit: Payer: Self-pay

## 2024-08-09 ENCOUNTER — Other Ambulatory Visit: Payer: Self-pay | Admitting: Internal Medicine

## 2024-08-09 DIAGNOSIS — G8929 Other chronic pain: Secondary | ICD-10-CM

## 2024-08-09 NOTE — Telephone Encounter (Unsigned)
 Copied from CRM [id number]. Topic: Clinical - Medication Refill >> Aug 09, 2024 12:02 PM Miquel SAILOR wrote: Medication: oxyCODONE  (OXY IR/ROXICODONE ) 5 MG immediate release tablet   Has the patient contacted their pharmacy? Yes (Agent: If no, request that the patient contact the pharmacy for the refill. If patient does not wish to contact the pharmacy document the reason why and proceed with request.) (Agent: If yes, when and what did the pharmacy advise?)  This is the patient's preferred pharmacy:  [facility] [address] [place] [address] Phone: [phone] Fax: [phone]   Is this the correct pharmacy for this prescription? Yes If no, delete pharmacy and type the correct one.   Has the prescription been filled recently? Yes  Is the patient out of the medication? Yes  Has the patient been seen for an appointment in the last year OR does the patient have an upcoming appointment? Yes  Can we respond through MyChart? Yes  Agent: Please be advised that Rx refills may take up to 3 business days. We ask that you follow-up with your pharmacy.

## 2024-08-09 NOTE — Telephone Encounter (Signed)
 Last rx writ - 03/15/24. Next OV - 08/11/24. TOX - 7/24 in the ER.

## 2024-08-10 ENCOUNTER — Other Ambulatory Visit: Payer: Self-pay | Admitting: Student

## 2024-08-10 ENCOUNTER — Other Ambulatory Visit: Payer: Self-pay | Admitting: Internal Medicine

## 2024-08-10 ENCOUNTER — Other Ambulatory Visit (HOSPITAL_COMMUNITY): Payer: Self-pay

## 2024-08-10 ENCOUNTER — Other Ambulatory Visit: Payer: Self-pay

## 2024-08-10 DIAGNOSIS — E119 Type 2 diabetes mellitus without complications: Secondary | ICD-10-CM

## 2024-08-10 DIAGNOSIS — E1159 Type 2 diabetes mellitus with other circulatory complications: Secondary | ICD-10-CM

## 2024-08-10 MED ORDER — METOCLOPRAMIDE HCL 5 MG PO TABS
5.0000 mg | ORAL_TABLET | Freq: Three times a day (TID) | ORAL | 1 refills | Status: DC
  Filled ????-??-??: fill #0

## 2024-08-10 MED ORDER — AMLODIPINE BESYLATE 10 MG PO TABS
10.0000 mg | ORAL_TABLET | Freq: Every day | ORAL | 3 refills | Status: AC
  Filled 2024-08-18: qty 30, 30d supply, fill #0
  Filled 2024-09-20 (×2): qty 30, 30d supply, fill #1
  Filled 2024-10-18: qty 30, 30d supply, fill #2
  Filled 2024-11-09 – 2024-11-10 (×2): qty 30, 30d supply, fill #3
  Filled 2024-12-23: qty 30, 30d supply, fill #4
  Filled 2025-01-22: qty 30, 30d supply, fill #5

## 2024-08-10 MED ORDER — METFORMIN HCL ER 500 MG PO TB24
500.0000 mg | ORAL_TABLET | Freq: Every day | ORAL | 11 refills | Status: AC
  Filled 2024-08-18: qty 30, 30d supply, fill #0
  Filled 2024-09-20 (×2): qty 30, 30d supply, fill #1
  Filled 2024-10-18: qty 30, 30d supply, fill #2
  Filled 2024-11-09 – 2024-11-10 (×2): qty 30, 30d supply, fill #3
  Filled 2024-12-23: qty 30, 30d supply, fill #4
  Filled 2025-01-22: qty 30, 30d supply, fill #5

## 2024-08-10 MED ORDER — OXYCODONE HCL 5 MG PO TABS
2.5000 mg | ORAL_TABLET | Freq: Four times a day (QID) | ORAL | 0 refills | Status: DC | PRN
  Filled 2024-08-10: qty 60, 30d supply, fill #0

## 2024-08-11 ENCOUNTER — Ambulatory Visit: Payer: Self-pay | Admitting: Internal Medicine

## 2024-08-11 ENCOUNTER — Other Ambulatory Visit: Payer: Self-pay

## 2024-08-11 ENCOUNTER — Other Ambulatory Visit (HOSPITAL_COMMUNITY): Payer: Self-pay

## 2024-08-15 ENCOUNTER — Other Ambulatory Visit: Payer: Self-pay

## 2024-08-15 ENCOUNTER — Encounter: Payer: Self-pay | Admitting: *Deleted

## 2024-08-15 ENCOUNTER — Other Ambulatory Visit (HOSPITAL_COMMUNITY): Payer: Self-pay

## 2024-08-15 ENCOUNTER — Telehealth: Payer: Self-pay | Admitting: *Deleted

## 2024-08-15 MED ORDER — DIVALPROEX SODIUM ER 500 MG PO TB24
500.0000 mg | ORAL_TABLET | Freq: Every day | ORAL | 0 refills | Status: DC
  Filled 2024-08-15 – 2024-08-18 (×2): qty 30, 30d supply, fill #0

## 2024-08-15 MED ORDER — ASPIRIN 81 MG PO TBEC
81.0000 mg | DELAYED_RELEASE_TABLET | Freq: Every day | ORAL | 2 refills | Status: DC
  Filled 2024-08-15 – 2024-08-18 (×2): qty 30, 30d supply, fill #0
  Filled 2024-09-20 (×2): qty 30, 30d supply, fill #1
  Filled 2024-10-18: qty 30, 30d supply, fill #2

## 2024-08-15 NOTE — Telephone Encounter (Signed)
 Called pt -call cannot be completed as dialed. I will send pt a My Chart message to call and schedule an appt.

## 2024-08-15 NOTE — Telephone Encounter (Signed)
 Copied from CRM #[id number]. Topic: Clinical - Prescription Issue >> Aug 15, 2024  1:45 PM Suzette B wrote: Reason for CRM: oxyCODONE  (OXY IR/ROXICODONE ) 5 MG immediate release tablet, 60 tablets her son Topacio Cella states the patient his mother is supposed to received 120 tablets but only received 51 Mr. Spradlin is requesting a call back to discuss the issue with the medication for the patient.    Leveda, Kendrix (Son) [phone] [facility])

## 2024-08-16 ENCOUNTER — Telehealth: Payer: Self-pay | Admitting: *Deleted

## 2024-08-16 ENCOUNTER — Other Ambulatory Visit: Payer: Self-pay

## 2024-08-16 NOTE — Telephone Encounter (Signed)
 Patient was identified as falling into the [place] Measure - Diabetes.   Patient was: Appointment already scheduled for:  09/22/24.

## 2024-08-17 DIAGNOSIS — R2689 Other abnormalities of gait and mobility: Secondary | ICD-10-CM | POA: Diagnosis not present

## 2024-08-17 DIAGNOSIS — E1143 Type 2 diabetes mellitus with diabetic autonomic (poly)neuropathy: Secondary | ICD-10-CM | POA: Diagnosis not present

## 2024-08-17 DIAGNOSIS — R279 Unspecified lack of coordination: Secondary | ICD-10-CM | POA: Diagnosis not present

## 2024-08-17 DIAGNOSIS — M86171 Other acute osteomyelitis, right ankle and foot: Secondary | ICD-10-CM | POA: Diagnosis not present

## 2024-08-17 DIAGNOSIS — Z89431 Acquired absence of right foot: Secondary | ICD-10-CM | POA: Diagnosis not present

## 2024-08-18 ENCOUNTER — Other Ambulatory Visit (HOSPITAL_COMMUNITY): Payer: Self-pay

## 2024-08-18 ENCOUNTER — Other Ambulatory Visit: Payer: Self-pay | Admitting: Internal Medicine

## 2024-08-18 ENCOUNTER — Other Ambulatory Visit: Payer: Self-pay

## 2024-08-18 DIAGNOSIS — Z89512 Acquired absence of left leg below knee: Secondary | ICD-10-CM | POA: Diagnosis not present

## 2024-08-18 MED ORDER — PANTOPRAZOLE SODIUM 40 MG PO TBEC
40.0000 mg | DELAYED_RELEASE_TABLET | Freq: Every day | ORAL | 0 refills | Status: DC
  Filled 2024-08-18: qty 30, 30d supply, fill #0

## 2024-08-18 NOTE — Telephone Encounter (Signed)
 Medication sent to pharmacy

## 2024-08-19 ENCOUNTER — Other Ambulatory Visit: Payer: Self-pay

## 2024-08-24 NOTE — Progress Notes (Deleted)
 Cardiology Office Note   Date:  08/24/2024  ID:  Jennifer [name], DOB 1961/08/25, MRN [id number] PCP: Jennifer Mliss Dragon, MD  [facility] Providers Cardiologist:  None { Click to update primary MD,subspecialty MD or APP then REFRESH:1}    History of Present Illness Jennifer [name] is a 63 y.o. female with a past medical history of CTA in 06/2024, PAD s/p bilateral amputations, COPD, type 2 DM on insulin . Patient presents today to establish care and for cardiac monitor follow up   Patient previously had a stress test completed in 08/2013 that showed no evidence of inducible myocardial ischemia. Later echocardiogram in 02/2021 showed EF 60-65%, no regional wall motion abnormalities, grade I DD, normal RV systolic function, no significant valvular abnormalities. Carotid ultrasounds in 02/2021 showed 1-39% stenosis in bilateral ICAs.   Patient has a history of PAD that has been followed by vascular surgery. 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 More recently was admitted in 03/2024 with sepsis and R foot osteomyelitis. Underwent R BKA during that admission and was discharged to SNF  Patient later admitted in 06/2024. Had presented with confusion from her SNF. 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 She was started on ASA and plavix . Underwent echocardiogram 07/18/24 that showed EF 65-70%, no regional wall motion abnormalities, mild LVH, grade I DD, normal RV systolic function, no significant valvular abnormalities. Carotid ultrasounds on 7/28 showed 1-39% stenosis in bilateral ICAs. TEE 7/29 showed evidence concerning for an interatrial shunt. Could consider structural heart team evaluation for possible PFO closure in the setting of stroke. Cardiac monitor was ordered, but has not been completed   Recent CVA  - Admitted in 06/2024 and was found to have an acute posterior left MCA infarct on brain MRI  - chocardiogram 07/18/24 that showed EF 65-70%, no regional wall motion abnormalities,  mild LVH, grade I DD, normal RV systolic function, no significant valvular abnormalities - Carotid ultrasounds with 1-39% stenosis in bilateral ICAs  - TEE showed evidence concerning for an interatrial shunt. Could consider structural heart team evaluation for possible PFO closure in the setting of stroke - With patient's comorbidities and poor functional status, I am not convinced she will be a candidate for PFO closure. Will reach out to *** -  - Continue ASA 81 mg daily  - Continue crestor  20 mg daily   PAD  - S/p bilateral BKAs  - Followed by vascular surgery  - continue asa, crestor    HLD   HTN  -  - Continue amlodipine  10 mg daily, spironolactone  50 mg daily   Type 2 DM  - Followed by PCP. A1c 8.8 in 06/2024  - On insulin , metformin    ROS: ***  Studies Reviewed      *** Risk Assessment/Calculations {Does this patient have ATRIAL FIBRILLATION?[phone]} No BP recorded.  {Refresh Note OR Click here to enter BP  :1}***       Physical Exam VS:  There were no vitals taken for this visit.       Wt Readings from Last 3 Encounters:  07/20/24 185 lb 10 oz (84.2 kg)  05/19/24 220 lb (99.8 kg)  04/11/24 220 lb 0.3 oz (99.8 kg)    GEN: Well nourished, well developed in no acute distress NECK: No JVD; No carotid bruits CARDIAC: ***RRR, no murmurs, rubs, gallops RESPIRATORY:  Clear to auscultation without rales, wheezing or rhonchi  ABDOMEN: Soft, non-tender, non-distended EXTREMITIES:  No edema; No deformity   ASSESSMENT AND PLAN ***    {  Are you ordering a CV Procedure (e.g. stress test, cath, DCCV, TEE, etc)?   Press F2        :[id number]}  Dispo: ***  Signed, Rollo FABIENE Louder, PA-C

## 2024-08-29 ENCOUNTER — Ambulatory Visit: Payer: Self-pay

## 2024-08-29 NOTE — Telephone Encounter (Signed)
 Talked to pt - informed pt she needs to schedule an appt with her PCP to discuss her pain medication. Appt scheduled Thursday 9/11 @ 1015Am with Dr Trudy.

## 2024-08-29 NOTE — Telephone Encounter (Addendum)
 FYI Only or Action Required?: Action required by provider: medication refill request.  Patient was last seen in primary care on 03/15/2024 by Trudy Mliss Dragon, MD.  Called Nurse Triage reporting Pain.  Symptoms began several days ago.  Interventions attempted: Prescription medications: Oxycodone .  Symptoms are: gradually worsening.  Triage Disposition: Call PCP When Office is Open  Patient/caregiver understands and will follow disposition?: Yes  Patient says she refilled her meds on 8/28 but sys only 60 tabs were ordered vs 120 that she was used to. Patient son Paulett called to speak with someone but per chart review, callback could not be completed. Pt says that her phone was disconnected during that time. Pt would like to discuss refilling the medication asap because is almost out of her medication. New phone number is [phone]  Copied from CRM [id number]. Topic: Clinical - Red Word Triage >> Aug 29, 2024 12:44 PM Laurier C wrote: Red Word that prompted transfer to Nurse Triage: Patient states she is in pain all over. She has 4 remaining oxyCODONE  and is scared to take them because she will run out. Reason for Disposition  Caller requesting a CONTROLLED substance prescription refill (e.g., narcotics, ADHD medicines)  Answer Assessment - Initial Assessment Questions 1. DRUG NAME: What medicine do you need to have refilled?     Oxycodone    2. REFILLS REMAINING: How many refills are remaining? Notes: The label on the medicine or pill bottle will show how many refills are remaining. If there are no refills remaining, then a renewal may be needed.     None  3. EXPIRATION DATE: What is the expiration date? Note: The label states when the prescription will expire, and thus can no longer be refilled.)     02/06/25  4. PRESCRIBER: Who prescribed it? Note: The prescribing doctor or group is responsible for refill approvals..     Dr. Mliss Trudy  5. PHARMACY: Have you  contacted your pharmacy (drugstore)? Note: Some pharmacies will contact the doctor (or NP/PA).      No  6. SYMPTOMS: Do you have any symptoms?     Phantom pain and bilateral hand pain  7. PREGNANCY: Is there any chance that you are pregnant? When was your last menstrual period?     No  Protocols used: Medication Refill and Renewal Call-A-AH

## 2024-08-31 ENCOUNTER — Other Ambulatory Visit (HOSPITAL_COMMUNITY): Payer: Self-pay

## 2024-09-01 ENCOUNTER — Ambulatory Visit: Admitting: Internal Medicine

## 2024-09-01 ENCOUNTER — Telehealth: Payer: Self-pay | Admitting: *Deleted

## 2024-09-01 NOTE — Telephone Encounter (Signed)
 Patient came in was late for her appointment . Was rescheduled for 09/22/2024.  Needs refill on her pain medication if possible.

## 2024-09-01 NOTE — Telephone Encounter (Signed)
 Informed patient after speaking with Dr Claire.  Dr. Trudy to call patient to discuss pain medication.  Will also refill for patient until next appointment with her in October.

## 2024-09-02 ENCOUNTER — Other Ambulatory Visit: Payer: Self-pay | Admitting: Internal Medicine

## 2024-09-02 ENCOUNTER — Other Ambulatory Visit (HOSPITAL_COMMUNITY): Payer: Self-pay

## 2024-09-02 DIAGNOSIS — F39 Unspecified mood [affective] disorder: Secondary | ICD-10-CM

## 2024-09-02 DIAGNOSIS — K219 Gastro-esophageal reflux disease without esophagitis: Secondary | ICD-10-CM

## 2024-09-02 DIAGNOSIS — G8929 Other chronic pain: Secondary | ICD-10-CM

## 2024-09-02 DIAGNOSIS — G547 Phantom limb syndrome without pain: Secondary | ICD-10-CM

## 2024-09-02 MED ORDER — OXYCODONE HCL 5 MG PO TABS
5.0000 mg | ORAL_TABLET | Freq: Three times a day (TID) | ORAL | 0 refills | Status: DC | PRN
  Filled 2024-09-02: qty 90, 30d supply, fill #0

## 2024-09-02 MED ORDER — DIVALPROEX SODIUM ER 500 MG PO TB24
500.0000 mg | ORAL_TABLET | Freq: Every day | ORAL | 0 refills | Status: DC
  Filled 2024-09-02 – 2024-09-20 (×3): qty 30, 30d supply, fill #0

## 2024-09-02 MED ORDER — PANTOPRAZOLE SODIUM 40 MG PO TBEC
40.0000 mg | DELAYED_RELEASE_TABLET | Freq: Every day | ORAL | 3 refills | Status: AC
  Filled 2024-09-02: qty 90, 90d supply, fill #0
  Filled 2024-09-20 (×2): qty 30, 30d supply, fill #0
  Filled 2024-10-18: qty 30, 30d supply, fill #1
  Filled 2024-11-09 – 2024-11-10 (×2): qty 30, 30d supply, fill #2
  Filled 2024-12-23: qty 30, 30d supply, fill #3
  Filled 2025-01-22: qty 30, 30d supply, fill #4

## 2024-09-02 NOTE — Telephone Encounter (Signed)
 Patient calling to speak with Ms. Kenneth. She stated that she was not able to speak with her when she called earlier today. Attempted to contact CAL twice but no answer. Please contact patient regarding refill.

## 2024-09-02 NOTE — Telephone Encounter (Signed)
 Patient calling to speak to Ms. Gladys regarding medication refill, transferred to clinic.

## 2024-09-05 ENCOUNTER — Other Ambulatory Visit (INDEPENDENT_AMBULATORY_CARE_PROVIDER_SITE_OTHER)

## 2024-09-05 ENCOUNTER — Other Ambulatory Visit: Payer: Self-pay

## 2024-09-05 ENCOUNTER — Other Ambulatory Visit: Payer: Self-pay | Admitting: Internal Medicine

## 2024-09-05 ENCOUNTER — Other Ambulatory Visit (HOSPITAL_COMMUNITY): Payer: Self-pay

## 2024-09-05 DIAGNOSIS — Z794 Long term (current) use of insulin: Secondary | ICD-10-CM

## 2024-09-05 DIAGNOSIS — E119 Type 2 diabetes mellitus without complications: Secondary | ICD-10-CM

## 2024-09-05 MED ORDER — TRESIBA FLEXTOUCH 200 UNIT/ML ~~LOC~~ SOPN
64.0000 [IU] | PEN_INJECTOR | Freq: Every day | SUBCUTANEOUS | 3 refills | Status: DC
  Filled 2024-09-05: qty 9, 28d supply, fill #0
  Filled 2024-10-19: qty 9, 28d supply, fill #1
  Filled 2024-11-10: qty 9, 28d supply, fill #2
  Filled 2024-12-08: qty 9, 28d supply, fill #3

## 2024-09-05 MED ORDER — DEXCOM G7 RECEIVER DEVI
0 refills | Status: AC
  Filled 2024-09-05: qty 1, 90d supply, fill #0

## 2024-09-05 NOTE — Progress Notes (Signed)
 09/05/2024 Name: Jennifer [name] MRN: [id number] DOB: 1961/04/05  Chief Complaint  Patient presents with   Diabetes    Jennifer [name] is a 63 y.o. year old female who presented for a telephone visit.   They were referred to the pharmacist by a quality report for assistance in managing diabetes. PMH includes PAD s/p stenting in 2005 and 2008 and R BKA (2024), HFpEF (EF 65-75% with mild LVH), CVA (July 2025), COPD, GERD, T2DM with gastroparesis, HLD, retinopathy, glaucoma, depression, tobacco use, BMI > 30, mild cognitive impairment.   Subjective: Patient was last seen by PCP, Mliss Pouch, MD, on 02/18/24. At last visit, diabetes continued to be poorly controlled, but unable to increase her GLP-1 or metformin  dose due to s/sx of gastroparesis. Patient also has chronic perineal candidiasis, so is not a candidate for SGLT2i. She also reported having poor living conditions, which exacerbates management of her chronic conditions. Since then, she has had multiple ED admissions. In April 2025, she was admitted for osteomyelitis eventually requiring R BKA. She was admitted again in July 2024 with confusion and AMS and found to have acute posterior L MCA infarct. She was initiated on aspirin  and clopidogrel  (3 weeks of DAPT). A1C found to be 9.8%. PTA she was taking basal insulin  65 units daily with metformin  and Ozempic . At discharge she was instructed to decrease insulin  degludec to 25 units daily. Per inpatient DM note, patient reported drinking regular sodas daily and not making healthy food choices. She lives with her son, but he does not assist her with her medications. She has not been able to attend her follow-up appointments.   Today, patient reports doing ok. She states that she has her oral medications in pill packs from [facility] pharmacy (except spironolactone  and oxycodone  which are filled separately). However, she does report that she has some old pill packs as well given her recent hospital stays.  She notes that metoprolol  is not her new pack (this was discontinued at discharge). She reports that she has a current supply of insulin  and is taking 65 units nightly (which differs from instructions at discharge to take 26 units daily). She has not been checking her blood sugars at all because she reports she has not been able to connect her Dexcom G7 to her phone. Reports it is more difficult to use this technology since her stroke. Also reports that she previously had a receiver which was eaten by rats. She requests a new receiver today. She confirms that she is taking Ozempic .    Care Team: Primary Care Provider: Pouch Mliss Dragon, MD ; Next Scheduled Visit: 09/22/24 Cardiologist: Rollo Louder, PA; Next Scheduled Visit: 09/07/24  Medication Access/Adherence  Current Pharmacy:  DARRYLE LAW - [facility] [address] [place] [address] Phone: [phone] Fax: [phone]  [facility] [address] [place] [address] Phone: [phone] Fax: [phone]   Patient reports affordability concerns with their medications: No  - [employer] Dual Complete - $0 copays  Patient reports access/transportation concerns to their pharmacy: No  - Using [facility] mail order (compliance packing)  Patient reports adherence concerns with their medications:  Yes  - has some old pill packs at home, but reports she takes her medications every day without missed doses. Denies missed doses of insulin  but there is a discrepancy in fill hx. - Tresiba  U200 filled 9mL on 06/27/24 (27 day supply + 6 days that patient was admitted)   Diabetes:  Current medications: Tresiba  26 units daily, metformin  XR 500 mg daily, Ozempic  1 mg weekly (Mondays)  Denies GI AE with Ozempic , metformin .  Current glucose readings: She is not checking - Dexcom not connected. Accu Chek Guide meter dispensed in Nov 2024.  Dexcom G7 not connected since Dec 2024 - reports that she  needs a new reciever. Reports since her stroke, she has had difficulty connecting the sensor to her phone (per [employer] report - she was previously connected online via an Android phone).  Patient denies hypoglycemic s/sx including dizziness, shakiness, sweating. Patient denies hyperglycemic symptoms including polyuria, polydipsia, polyphagia, nocturia, neuropathy, blurred vision.  Current meal patterns: 2 meals/day - Snacks - does not eat a lot of sweets or candy. - Drinks - Drinking regular soda (caffeine free orange soda) every day - her son purchases this from [employer] for her.   Hypertension:  Current medications: amlodipine  10 mg daily, spironolactone  50 mg daily Medications previously tried: metoprolol  succinate (d/c at discharge July 2025)  Patient has an automated, upper arm home BP cuff - not checking. Not sure if it works.  Patient denies hypotensive s/sx including dizziness, lightheadedness. Patient denies hypertensive symptoms including headache, chest pain, shortness of breath  Current physical activity: limited s/p stroke. Reports she can get into her wheelchair from the bed, but cannot get back into bed   Hyperlipidemia/ASCVD Risk Reduction  Current lipid lowering medications: rosuvastatin  20 mg daily  Antiplatelet regimen: aspirin  81 mg daily  ASCVD History: CVA in 2025  Objective:  BP Readings from Last 3 Encounters:  07/20/24 (!) 161/84  05/19/24 (!) 145/82  04/18/24 (!) 152/62    Lab Results  Component Value Date   HGBA1C 8.8 (H) 07/18/2024   HGBA1C 9.8 (H) 04/06/2024   HGBA1C 9.2 (A) 02/18/2024       Latest Ref Rng & Units 07/20/2024   12:46 PM 07/19/2024    5:02 AM 07/18/2024    6:06 AM  BMP  Glucose 70 - 99 mg/dL 697  768  811   BUN 8 - 23 mg/dL 14  9  <5   Creatinine 0.44 - 1.00 mg/dL 9.12  9.12  9.12   Sodium 135 - 145 mmol/L 132  137  137   Potassium 3.5 - 5.1 mmol/L 4.8  4.9  4.0   Chloride 98 - 111 mmol/L 101  104  104   CO2 22 - 32  mmol/L 22  23  22    Calcium  8.9 - 10.3 mg/dL 9.2  9.1  8.9     Lab Results  Component Value Date   CHOL 85 07/18/2024   HDL 36 (L) 07/18/2024   LDLCALC 36 07/18/2024   TRIG 64 07/18/2024   CHOLHDL 2.4 07/18/2024    Medications Reviewed Today     Reviewed by Brinda Lorain SQUIBB, RPH (Pharmacist) on 09/05/24 at 1042  Med List Status: <None>   Medication Order Taking? Sig Documenting Provider Last Dose Status Informant  Accu-Chek Softclix Lancets lancets [id number]  Use as back up to CGM sensors up to 1 time a day Trudy Mliss Dragon, MD  Active Pharmacy Records, Family Member, Multiple Informants  acetaminophen  (TYLENOL ) 650 MG CR tablet [id number]  Take 2 tablets (1,300 mg total) by mouth every 8 (eight) hours as needed for pain.  Patient taking differently: Take 1,300 mg by mouth daily as needed for pain.   Mapp, Tavien, MD  Active Pharmacy Records, Family Member, Multiple Informants  albuterol  (PROAIR  HFA) 108 [phone] Base) MCG/ACT inhaler  [id number]  Inhale 2 puffs into the lungs every 6 (six) hours as needed for wheezing. Trudy Mliss Dragon, MD  Active Pharmacy Records, Family Member, Multiple Informants  amLODipine  (NORVASC ) 10 MG tablet [id number] Yes Take 1 tablet (10 mg total) by mouth daily. Trudy Mliss Dragon, MD  Active   aspirin  EC 81 MG tablet [id number] Yes Take 1 tablet (81 mg total) by mouth daily. Swallow whole. Trudy Mliss Dragon, MD  Active   Continuous Glucose Sensor [facility] G7 SENSOR) [place] [id number]  Place new sensor every 10 days. Use to monitor blood sugar continuously.  Patient not taking: Reported on 09/05/2024   Trudy Mliss Dragon, MD  Active Pharmacy Records, Family Member, Multiple Informants  divalproex  (DEPAKOTE  ER) 500 MG 24 hr tablet [id number] Yes Take 1 tablet (500 mg total) by mouth daily. Trudy Mliss Dragon, MD  Active   DULoxetine  (CYMBALTA ) 60 MG capsule [id number] Yes Take 1 capsule (60 mg total) by mouth daily. Trudy Mliss Dragon, MD  Active Pharmacy  Records, Family Member, Multiple Informants  fluticasone  (FLONASE ) 50 MCG/ACT nasal spray [id number]  Place 1 spray into both nostrils 2 (two) times daily for allergies.   Active Pharmacy Records, Family Member, Multiple Informants  glucose blood (ACCU-CHEK GUIDE TEST) test strip [id number]  Use as back up to CGM sensors up to 1 time a day Trudy Mliss Dragon, MD  Active Pharmacy Records, Family Member, Multiple Informants  insulin  degludec (TRESIBA  FLEXTOUCH) 200 UNIT/ML FlexTouch Pen [id number] Yes Inject 26 Units into the skin daily.  Patient taking differently: Inject 26 Units into the skin daily.   Jolaine Pac, DO  Active   Insulin  Pen Needle 32G X 4 MM MISC [id number]  Use to inject insulin  4 (four) times daily. Trudy Mliss Dragon, MD  Active Pharmacy Records, Family Member, Multiple Informants  lidocaine  (LIDODERM ) 5 % [id number]  Place 1 patch onto the skin daily. Remove & Discard patch within 12 hours or as directed by MD Doretha Folks, MD  Active Pharmacy Records, Family Member, Multiple Informants  metFORMIN  (GLUCOPHAGE -XR) 500 MG 24 hr tablet [id number] Yes Take 1 tablet (500 mg total) by mouth daily with breakfast. Trudy Mliss Dragon, MD  Active   MYRBETRIQ  50 MG TB24 tablet [id number] Yes Take 1 tablet (50 mg total) by mouth daily. Trudy Mliss Dragon, MD  Active Pharmacy Records, Family Member, Multiple Informants  ondansetron  (ZOFRAN ) 4 MG tablet [id number]  Take 1 tablet (4 mg total) by mouth every 8 (eight) hours as needed for nausea and vomiting.   Active Pharmacy Records, Family Member, Multiple Informants  oxyCODONE  (OXY IR/ROXICODONE ) 5 MG immediate release tablet [id number] Yes Take 1 tablet (5 mg total) by mouth 3 (three) times daily as needed for severe pain (pain score 7-10). Trudy Mliss Dragon, MD  Active   pantoprazole  (PROTONIX ) 40 MG tablet [id number] Yes Take 1 tablet (40 mg total) by mouth daily for GERD. Trudy Mliss Dragon, MD  Active     Discontinued 01/23/14  1328 (Reorder)   pregabalin  (LYRICA ) 25 MG capsule [id number] Yes Take 1 capsule (25 mg total) by mouth 3 (three) times daily. Jolaine Pac, DO  Active   rosuvastatin  (CRESTOR ) 20 MG tablet [id number] Yes Take 1 tablet (20 mg total) by mouth in the morning. Trudy Mliss Dragon, MD  Active Pharmacy Records, Family Member, Multiple Informants  Semaglutide , 1 MG/DOSE, (OZEMPIC , 1 MG/DOSE,) 4 MG/3ML SOPN [id number] Yes Inject 1 mg into the skin once a week. Trudy Mliss Dragon, MD  Active Pharmacy Records, Family  Member, Multiple Informants  silver  sulfADIAZINE  (SILVADENE ) 1 % cream [id number]  Apply 1 Application topically daily. To the wounds on your leg and foot Doretha Folks, MD  Active Pharmacy Records, Family Member, Multiple Informants  spironolactone  (ALDACTONE ) 50 MG tablet [id number] Yes Take 1 tablet (50 mg total) by mouth daily. Trudy Mliss Dragon, MD  Active Pharmacy Records, Family Member, Multiple Informants           Med Note (LEE, NICOLE   Wed Apr 06, 2024  2:55 AM)    umeclidinium-vilanterol (ANORO ELLIPTA ) 62.5-25 MCG/ACT AEPB [id number] Yes Inhale 1 puff into the lungs in the morning. Trudy Mliss Dragon, MD  Active Pharmacy Records, Family Member, Multiple Informants              Assessment/Plan:   Diabetes: - Currently uncontrolled with most recent A1C of 8.8% above goal <7%, but improved from 9.8%. Patient continues to tolerate GLP-1RA despite dx of gastroparesis. She reports she is resumed her PTA insulin  dose of insulin  degludec (Tresiba ) 65 units daily - however her fill hx indicates she is overdue for a refill. Unable to assess glycemic control today but patient denies s/sx of hypo- or hyperglycemia. Will assist in getting her reconnected with Dexcom and obtaining refill of insulin .  - Last UACR April 2024 - 47 mg/g. She is not a candidate for SGLT2i given chronic yeast infections. - Reviewed long term cardiovascular and renal outcomes of uncontrolled blood  sugar - Reviewed goal A1c, goal fasting, and goal 2 hour post prandial glucose - Reviewed hypoglycemia management plan and the rule of 15 - Reviewed dietary modifications including  utilizing the healthy plate method, limiting portion size of carbohydrate foods, increasing intake of protein and non-starchy vegetables. Counseled patient to stay hydrated with water  throughout the day. Patient strongly encouraged to replace regular orange sugar with a sugar free option. Discussed flavored sparkling water  and she reported that she would consider switching. - Recommend to continue Tresiba  U200 65 units daily, Ozempic  1 mg daily, metformin  XR 500 mg daily  - Recommend to check glucose continuously with Dexcom G7. Test claim for receiver indicates $0 copay. Will request that this is mailed out from [facility] pharmacy. Asked patient to bring to appt on 09/22/24. Will assist in setting up follow-up with CDC[facility], [place], at [facility] as requested by patient. - Next A1C due 10/18/24     Hypertension: - Currently uncontrolled with clinic BP consistently above goal less than 130/80. Patient is not having s/sx of hypo- or hyper-tension. Metoprolol  succinate was discontinued at hospital d/c so now patient is on CCB and MRA for HTN. Scr and K are stable. She has a hx of swelling with benazepril  so cannot take ACEi/ARB due to c/f angioedema. - Recommend to continue amlodipine  10 mg daily, spironolactone  50 mg daily    Hyperlipidemia/ASCVD Risk Reduction: - Currently controlled with most recent LDL-C of 36 mg/dL below goal < 55 mg/dL given premature ASCVD (PAD, CVA). High intensity statin indicated. - Recommend to continue rosuvastatin  20 mg daily, aspirin  81 mg daily    Patient verbalized understanding of treatment plan.   Follow Up Plan:  Pharmacist telephone 10/13/24 PCP clinic visit 09/22/24   Lorain Baseman, PharmD [facility] [phone]

## 2024-09-06 ENCOUNTER — Ambulatory Visit: Payer: Self-pay

## 2024-09-06 ENCOUNTER — Other Ambulatory Visit (HOSPITAL_COMMUNITY): Payer: Self-pay

## 2024-09-06 ENCOUNTER — Telehealth: Payer: Self-pay | Admitting: *Deleted

## 2024-09-06 ENCOUNTER — Other Ambulatory Visit: Payer: Self-pay

## 2024-09-06 NOTE — Telephone Encounter (Unsigned)
 Copied from CRM [id number]. Topic: Clinical - Prescription Issue >> Sep 06, 2024  4:04 PM Jennifer [name] ORN wrote: Reason for CRM: patient calling on the status of her medication refill for the oxyCODONE  (OXY IR/ROXICODONE ) 5 MG immediate release tablet  was suppose to be delivered today never run this late  in the day by DARRYLE LONG - [facility] [address] [place] [address] Phone: [phone] Fax: [phone] Hours: Mon-Fri 7:30a-7p; Sat 8a-4:30p; [place] 10a-2p

## 2024-09-06 NOTE — Telephone Encounter (Signed)
 FYI Only or Action Required?: FYI only for provider.  Patient was last seen in primary care on 03/15/2024 by Trudy Mliss Dragon, MD.  Called Nurse Triage reporting Advice Only.  Symptoms began today.  Interventions attempted: Other: called pharmacy.  Triage Disposition: Information or Advice Only Call  Patient/caregiver understands and will follow disposition?: Yes     Copied from CRM #[id number]. 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 REASON FOR CALL: What is the main reason for your call? or How can I best help you? Pt called to follow up on rx for    oxyCODONE  (OXY IR/ROXICODONE ) 5 MG immediate release tablet .  Nurse and pt together called pt's pharmacy and was informed the medication was mailed out today and the patient should have the medication tomorrow.  Protocols used: Information Only Call - No Triage-A-AH

## 2024-09-07 ENCOUNTER — Other Ambulatory Visit: Payer: Self-pay

## 2024-09-07 ENCOUNTER — Ambulatory Visit: Attending: Cardiology | Admitting: Cardiology

## 2024-09-07 ENCOUNTER — Other Ambulatory Visit (HOSPITAL_COMMUNITY): Payer: Self-pay

## 2024-09-08 ENCOUNTER — Other Ambulatory Visit: Payer: Self-pay

## 2024-09-09 ENCOUNTER — Other Ambulatory Visit: Payer: Self-pay

## 2024-09-09 ENCOUNTER — Other Ambulatory Visit (HOSPITAL_COMMUNITY): Payer: Self-pay

## 2024-09-17 DIAGNOSIS — E1143 Type 2 diabetes mellitus with diabetic autonomic (poly)neuropathy: Secondary | ICD-10-CM | POA: Diagnosis not present

## 2024-09-17 DIAGNOSIS — Z89431 Acquired absence of right foot: Secondary | ICD-10-CM | POA: Diagnosis not present

## 2024-09-17 DIAGNOSIS — R2689 Other abnormalities of gait and mobility: Secondary | ICD-10-CM | POA: Diagnosis not present

## 2024-09-17 DIAGNOSIS — R279 Unspecified lack of coordination: Secondary | ICD-10-CM | POA: Diagnosis not present

## 2024-09-19 ENCOUNTER — Other Ambulatory Visit (HOSPITAL_COMMUNITY): Payer: Self-pay

## 2024-09-20 ENCOUNTER — Other Ambulatory Visit: Payer: Self-pay

## 2024-09-20 ENCOUNTER — Other Ambulatory Visit (HOSPITAL_COMMUNITY): Payer: Self-pay

## 2024-09-21 ENCOUNTER — Other Ambulatory Visit: Payer: Self-pay

## 2024-09-22 ENCOUNTER — Other Ambulatory Visit (HOSPITAL_COMMUNITY): Payer: Self-pay

## 2024-09-22 ENCOUNTER — Ambulatory Visit: Admitting: Internal Medicine

## 2024-09-22 ENCOUNTER — Encounter: Payer: Self-pay | Admitting: Internal Medicine

## 2024-09-22 ENCOUNTER — Other Ambulatory Visit: Payer: Self-pay

## 2024-09-22 ENCOUNTER — Ambulatory Visit (INDEPENDENT_AMBULATORY_CARE_PROVIDER_SITE_OTHER): Admitting: Dietician

## 2024-09-22 VITALS — BP 146/67 | HR 91 | Temp 97.7°F | Ht 65.0 in

## 2024-09-22 DIAGNOSIS — E1159 Type 2 diabetes mellitus with other circulatory complications: Secondary | ICD-10-CM

## 2024-09-22 DIAGNOSIS — F17210 Nicotine dependence, cigarettes, uncomplicated: Secondary | ICD-10-CM

## 2024-09-22 DIAGNOSIS — G547 Phantom limb syndrome without pain: Secondary | ICD-10-CM | POA: Diagnosis not present

## 2024-09-22 DIAGNOSIS — E1142 Type 2 diabetes mellitus with diabetic polyneuropathy: Secondary | ICD-10-CM

## 2024-09-22 DIAGNOSIS — Z79899 Other long term (current) drug therapy: Secondary | ICD-10-CM | POA: Diagnosis not present

## 2024-09-22 DIAGNOSIS — I739 Peripheral vascular disease, unspecified: Secondary | ICD-10-CM

## 2024-09-22 DIAGNOSIS — E119 Type 2 diabetes mellitus without complications: Secondary | ICD-10-CM | POA: Diagnosis not present

## 2024-09-22 DIAGNOSIS — I69322 Dysarthria following cerebral infarction: Secondary | ICD-10-CM

## 2024-09-22 DIAGNOSIS — E118 Type 2 diabetes mellitus with unspecified complications: Secondary | ICD-10-CM

## 2024-09-22 DIAGNOSIS — Z833 Family history of diabetes mellitus: Secondary | ICD-10-CM

## 2024-09-22 DIAGNOSIS — I10 Essential (primary) hypertension: Secondary | ICD-10-CM

## 2024-09-22 DIAGNOSIS — I152 Hypertension secondary to endocrine disorders: Secondary | ICD-10-CM

## 2024-09-22 DIAGNOSIS — Z89512 Acquired absence of left leg below knee: Secondary | ICD-10-CM

## 2024-09-22 DIAGNOSIS — Z8249 Family history of ischemic heart disease and other diseases of the circulatory system: Secondary | ICD-10-CM

## 2024-09-22 DIAGNOSIS — I499 Cardiac arrhythmia, unspecified: Secondary | ICD-10-CM | POA: Diagnosis not present

## 2024-09-22 DIAGNOSIS — N3946 Mixed incontinence: Secondary | ICD-10-CM

## 2024-09-22 DIAGNOSIS — Z7984 Long term (current) use of oral hypoglycemic drugs: Secondary | ICD-10-CM | POA: Diagnosis not present

## 2024-09-22 DIAGNOSIS — Z9889 Other specified postprocedural states: Secondary | ICD-10-CM

## 2024-09-22 DIAGNOSIS — Z89511 Acquired absence of right leg below knee: Secondary | ICD-10-CM

## 2024-09-22 DIAGNOSIS — Z794 Long term (current) use of insulin: Secondary | ICD-10-CM | POA: Diagnosis not present

## 2024-09-22 DIAGNOSIS — R112 Nausea with vomiting, unspecified: Secondary | ICD-10-CM

## 2024-09-22 DIAGNOSIS — I63412 Cerebral infarction due to embolism of left middle cerebral artery: Secondary | ICD-10-CM

## 2024-09-22 MED ORDER — MYRBETRIQ 50 MG PO TB24
50.0000 mg | ORAL_TABLET | Freq: Every day | ORAL | 3 refills | Status: AC
  Filled 2024-10-18: qty 30, 30d supply, fill #0
  Filled 2024-11-09 – 2024-11-10 (×2): qty 30, 30d supply, fill #1
  Filled 2024-12-23: qty 30, 30d supply, fill #2
  Filled 2025-01-22: qty 30, 30d supply, fill #3

## 2024-09-22 MED ORDER — SPIRONOLACTONE 50 MG PO TABS
50.0000 mg | ORAL_TABLET | Freq: Every day | ORAL | 3 refills | Status: AC
  Filled 2024-09-22 – 2024-11-08 (×3): qty 90, 90d supply, fill #0
  Filled 2025-01-22: qty 90, 90d supply, fill #1

## 2024-09-22 NOTE — Patient Instructions (Addendum)
 Thank you for your visit today!   Dr. Octavia appointment- Jennifer [name], February 19th, 2026 at 215 pm  [phone]  Goal is to lower blood sugars to have more in the 100s and less more than 200. To do this you said you would try to drink less regular soda  Be sure to eat something with protein twice a day beans, chicken , tuan, eggs, cheese, peanut butter, etc...  Try not to slide on your skin- maybe slide on clothes.   Appointment with cardiologist-Kathleen Vicci is on Tuesday,  October 25, 2024 at 430 PM [address] [phone]  A1c due on 10-18-24- I'd like to see you when you come in to get that done.   I'd like to see you that same day.  Jennifer [name] [phone] (new number)

## 2024-09-22 NOTE — Assessment & Plan Note (Addendum)
 Having some dysarthria and expressive language changes and is interested in speech therapy. No R sided weakness.
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
## 2024-09-22 NOTE — Patient Instructions (Addendum)
 Jennifer [name],  We are going to go up on your Lyrica ; I'll make this change after today's appointment because I have to put in a special code which I don't have right now.    We talked about your pain medicine.  I'm glad you are using it only as needed and not around the clock.  I understand that there are times when the pain is particularly bad.  I'll review your prescription to see if I can adjust it to help you be more comfortable, without causing unwanted side effects.  What typically happens is that as the doses increase, and we take it for a long time, the medicine becomes less effective.  We have to keep our options available.  Jennifer [name] wants to see you again, to review your A1c and download readings.  Let's get back on track with more frequent visit!  I ordered a shower bench and a toilet lift.  Let's see what we can make happen.  Please check out [facility]!  They are great.  Dr. Trudy
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
## 2024-09-22 NOTE — Assessment & Plan Note (Addendum)
 In process of having R leg fit for prosthesis, left prosthesis remains with good fit. [facility]. Needs shower bench/chair and elevated toilet seat to assist with ADLs.  Orders placed for DME.

## 2024-09-23 ENCOUNTER — Other Ambulatory Visit: Payer: Self-pay

## 2024-09-27 ENCOUNTER — Other Ambulatory Visit: Payer: Self-pay | Admitting: Internal Medicine

## 2024-09-27 ENCOUNTER — Ambulatory Visit: Payer: Self-pay

## 2024-09-27 DIAGNOSIS — G547 Phantom limb syndrome without pain: Secondary | ICD-10-CM

## 2024-09-27 DIAGNOSIS — K219 Gastro-esophageal reflux disease without esophagitis: Secondary | ICD-10-CM

## 2024-09-27 DIAGNOSIS — G8929 Other chronic pain: Secondary | ICD-10-CM

## 2024-09-27 NOTE — Addendum Note (Signed)
 Addended by: CAMMIE GAETANA DEL on: 09/27/2024 02:26 PM   Modules accepted: Orders

## 2024-09-27 NOTE — Telephone Encounter (Signed)
 Patient reports running out of pain medication on Saturday. Increased pain to bilateral stumps and hands. Patient is needing a refill on pain medication. Refill was sent in a separate encounter. Patient is requesting a phone call from PCP.   FYI Only or Action Required?: Action required by provider: medication refill request and patient would like a call back from office.  Patient was last seen in primary care on 09/22/2024 by Trudy Mliss Dragon, MD.  Called Nurse Triage reporting Pain.  Symptoms began Saturday after running out of medication.  Interventions attempted: OTC medications: Tylenol  and Rest, hydration, or home remedies.  Symptoms are: unchanged.  Triage Disposition: See HCP Within 4 Hours (Or PCP Triage)  Patient/caregiver understands and will follow disposition?: No, wishes to speak with PCP  Copied from CRM #[id number]. Topic: Clinical - Red Word Triage >> Sep 27, 2024  8:49 AM [name] wrote: Red Word that prompted transfer to Nurse Triage:   Concern: Pain in stumps (patient is a double amputee)  and in hands  Symptoms:  When did the symptoms start?: Saturday once the patient ran out of her prescription  What have you done to aid in the concern ? Have you taken anything to assist with the matter?: Yes   If so, what did you take?: Tylenol  but has seen no relief   Wanted to let you know I will be transferring you to further discuss your concern. Please be advised the nurse can assist with scheduling. Reason for Disposition  [1] SEVERE pain (e.g., excruciating, unable to do any normal activities) AND [2] not improved after 2 hours of pain medicine  Answer Assessment - Initial Assessment Questions 1. ONSET: When did the pain start?      Started late Saturday-patient ran out of pain medication 2. LOCATION: Where is the pain located?      Bilateral stumps-patient is a double amputee 3. PAIN: How bad is the pain?    (Scale 1-10; or mild, moderate, severe)      10 4. WORK OR EXERCISE: Has there been any recent work or exercise that involved this part of the body?      no 5. CAUSE: What do you think is causing the leg pain?     Pain to bilateral stumps 6. OTHER SYMPTOMS: Do you have any other symptoms? (e.g., chest pain, back pain, breathing difficulty, swelling, rash, fever, numbness, weakness)     Hand pain  Protocols used: Leg Pain-A-AH

## 2024-09-27 NOTE — Telephone Encounter (Unsigned)
 Copied from CRM #[id number]. Topic: Clinical - Medication Refill >> Sep 27, 2024  8:51 AM [name] wrote: Patient has been without the following prescription since Saturday and is in a great deal of pain in her stumps and hands. Patient discussed refilling her prescription with her provider on the 09/22/2024 during her appointment but the prescription was never filled. Please reach out to the patient after prescription has been sent. Patient at this present time has none of the medication left. Patient was sent to Nurse Triage due to pain.  Callback Number: [id number]   Medication: oxyCODONE  (OXY IR/ROXICODONE ) 5 MG immediate release tablet  Has the patient contacted their pharmacy? Yes   This is the patient's preferred pharmacy:  [facility] [address] [place] [address] Phone: [phone] Fax: [phone]    Is this the correct pharmacy for this prescription? Yes   Has the prescription been filled recently? No  Is the patient out of the medication? Yes  Has the patient been seen for an appointment in the last year OR does the patient have an upcoming appointment? Yes  Can we respond through MyChart? No  Agent: Please be advised that Rx refills may take up to 3 business days. We ask that you follow-up with your pharmacy.

## 2024-09-28 ENCOUNTER — Other Ambulatory Visit (HOSPITAL_COMMUNITY): Payer: Self-pay

## 2024-09-28 ENCOUNTER — Other Ambulatory Visit: Payer: Self-pay | Admitting: Internal Medicine

## 2024-09-28 DIAGNOSIS — G547 Phantom limb syndrome without pain: Secondary | ICD-10-CM

## 2024-09-28 MED ORDER — OXYCODONE-ACETAMINOPHEN 7.5-325 MG PO TABS
1.0000 | ORAL_TABLET | Freq: Three times a day (TID) | ORAL | 0 refills | Status: DC | PRN
  Filled 2024-09-28: qty 90, 30d supply, fill #0

## 2024-09-28 NOTE — Telephone Encounter (Signed)
 Oxycodone  changed to Percocet, higher dose

## 2024-09-29 ENCOUNTER — Other Ambulatory Visit (HOSPITAL_COMMUNITY): Payer: Self-pay

## 2024-09-30 ENCOUNTER — Other Ambulatory Visit (HOSPITAL_COMMUNITY): Payer: Self-pay

## 2024-10-04 ENCOUNTER — Telehealth: Payer: Self-pay | Admitting: *Deleted

## 2024-10-04 NOTE — Telephone Encounter (Signed)
 Copied from CRM [id number]. Topic: Clinical - Medical Advice >> Oct 03, 2024  5:06 PM Debby BROCKS wrote: Reason for CRM: Patient would like to speak to PCP about the [facility] program that was suggested to her. She doesn't know if its a good fit for her or not

## 2024-10-06 ENCOUNTER — Other Ambulatory Visit (HOSPITAL_COMMUNITY): Payer: Self-pay

## 2024-10-06 ENCOUNTER — Other Ambulatory Visit: Payer: Self-pay

## 2024-10-06 ENCOUNTER — Other Ambulatory Visit: Payer: Self-pay | Admitting: Internal Medicine

## 2024-10-06 ENCOUNTER — Telehealth: Payer: Self-pay | Admitting: *Deleted

## 2024-10-06 NOTE — Telephone Encounter (Signed)
 LOV - 10/2/225.

## 2024-10-06 NOTE — Telephone Encounter (Signed)
 Will forward to PCP.  Copied from CRM [id number]. Topic: Clinical - Medical Advice >> Oct 03, 2024  5:06 PM Debby BROCKS wrote: Reason for CRM: Patient would like to speak to PCP about the [facility] program that was suggested to her. She doesn't know if its a good fit for her or not >> Oct 06, 2024  1:28 PM Chiquita SQUIBB wrote: Patient is requesting a call from Dr. Trudy regarding this program.

## 2024-10-06 NOTE — Telephone Encounter (Unsigned)
 Copied from CRM [id number]. Topic: Clinical - Medication Refill >> Oct 06, 2024  1:26 PM Chiquita SQUIBB wrote: Medication: pregabalin  (LYRICA ) 25 MG capsule  Patient states it is suppose to be 100 MG  Has the patient contacted their pharmacy? Yes (Agent: If no, request that the patient contact the pharmacy for the refill. If patient does not wish to contact the pharmacy document the reason why and proceed with request.) (Agent: If yes, when and what did the pharmacy advise?)  This is the patient's preferred pharmacy:  [place] [facility] [address] [place] [address] Phone: [phone] Fax: [phone]   Is this the correct pharmacy for this prescription? Yes If no, delete pharmacy and type the correct one.   Has the prescription been filled recently? No  Is the patient out of the medication? Yes  Has the patient been seen for an appointment in the last year OR does the patient have an upcoming appointment? Yes  Can we respond through MyChart? Yes  Agent: Please be advised that Rx refills may take up to 3 business days. We ask that you follow-up with your pharmacy.

## 2024-10-07 ENCOUNTER — Other Ambulatory Visit: Payer: Self-pay

## 2024-10-07 ENCOUNTER — Other Ambulatory Visit: Payer: Self-pay | Admitting: Internal Medicine

## 2024-10-07 ENCOUNTER — Other Ambulatory Visit (HOSPITAL_COMMUNITY): Payer: Self-pay

## 2024-10-07 DIAGNOSIS — G547 Phantom limb syndrome without pain: Secondary | ICD-10-CM

## 2024-10-07 MED ORDER — PREGABALIN 75 MG PO CAPS
75.0000 mg | ORAL_CAPSULE | Freq: Three times a day (TID) | ORAL | 3 refills | Status: DC
  Filled 2024-10-07 – 2024-10-31 (×2): qty 270, 90d supply, fill #0

## 2024-10-07 NOTE — Telephone Encounter (Signed)
 Patient was called and informed that Dr. Trudy will give her a call next week when she returns to the office. Message was left on patient's voice mail.

## 2024-10-10 ENCOUNTER — Encounter: Payer: Self-pay | Admitting: Internal Medicine

## 2024-10-10 ENCOUNTER — Other Ambulatory Visit: Payer: Self-pay

## 2024-10-10 NOTE — Assessment & Plan Note (Signed)
 Now s/p B BKA

## 2024-10-10 NOTE — Assessment & Plan Note (Signed)
 Severe bilateral phantom limb pain. Pregabalin  had been significantly reduced during 06/2024 admission; will begin increasing dose carefully.  Anticipate also increasing oxycodone  and changing to Percocet.

## 2024-10-10 NOTE — Assessment & Plan Note (Signed)
 Currently experiencing no symptoms.  She is tolerating her metformin  and semaglutide .

## 2024-10-10 NOTE — Assessment & Plan Note (Signed)
 Auscultates regular today.  Monitor.

## 2024-10-10 NOTE — Assessment & Plan Note (Signed)
 Admitted 06/2024 with metabolic versus medication associated encephalopathy for which multiple medications were reduced.  Mental status has remained normal since hospital discharge.  In the process of gradually increasing dosing including for Lyrica  today and possibly for her chronic opioid (severe phantom pain).

## 2024-10-10 NOTE — Assessment & Plan Note (Signed)
 Currently prescribed Tresiba  taking 64 units daily, metforminXR 500 mg daily, semaglutide  1 mg weekly.  Most recent A1c 8.8 in 06/2024-failed to obtain A1c today, we will see her in close follow-up and complete this.  Jennifer [name] met with diabetes coordinator Ms. Plyler today.  Soda intake remains significant.  Food security and choice was explored.  Using CGM but forgot receiver today.  A telephone visit with our Pharm.D. in the preceding month was reviewed.  At that time she commented that her receiver was not functioning and requested a new one.  She is finding it more difficult to use technology since her stroke.  Notes she is due for UAC, will obtain next visit.

## 2024-10-10 NOTE — Assessment & Plan Note (Signed)
 146/67 today-did not take morning medications.  Prescribed spironolactone  50 mg daily, amlodipine  10 mg daily.  No change, monitor.

## 2024-10-11 ENCOUNTER — Other Ambulatory Visit: Payer: Self-pay

## 2024-10-11 NOTE — Progress Notes (Deleted)
 Cardiology Office Note:    Date:  10/25/2024   ID:  Jennifer [name], DOB 1961-10-01, MRN [id number]  PCP:  Trudy Mliss Dragon, MD   [facility] Providers Cardiologist:  None { Click to update primary MD,subspecialty MD or APP then REFRESH:1}    Referring MD: Trudy Mliss Dragon, MD   Chief complaint: Establishing care     History of Present Illness:   Jennifer [name] is a 63 y.o. female with a hx of of recent CVA, hyperlipidemia, type 2 diabetes, hypertension, GERD, PAD, CKD stage II, COPD.  Patient presents today as a new patient for follow-up after cardiac monitor.   Patient has a history of PAD and has been followed by vascular surgery.  Has a history of left below the knee amputation.  Later had right below-knee amputation in 03/2024 in the setting of osteomyelitis.   Patient does not follow with cardiology.  To have a stress test in 2014 that showed no evidence of inducible myocardial ischemia.   Patient was admitted 7/24 - 07/20/2024.  She had presented with confusion and altered mental status.  MRA brain showed acute posterior left MCA infarct, predominantly involving the posterior insula and overlying left parietal lobe.  Started on aspirin  and Plavix .  Underwent echocardiogram 07/18/2024 that showed EF 65-70%, no regional wall motion abnormalities, mild LVH, grade 1 DD, normal RV systolic function, no significant valvular abnormalities.  Carotid duplex 07/18/2024 showed 1-39% stenosis in bilateral ICAs.  TEE 07/19/2024 showed EF 65 to 70%, mild LVH, normal RV systolic function, no left atrial appendage thrombus noted.  Trivial MR. There was a small patent ovale noted.  Discharged with 30-day monitor, does not appear to have been completed.  ROS:   Please see the history of present illness.    *** All other systems reviewed and are negative.     Past Medical History:  Diagnosis Date   Abnormality of gait and recurrent falls 03/01/2013   Acute metabolic  encephalopathy 07/20/2024   Acute vestibular syndrome, resolved 03/02/2021   Angioedema 06/14/2021   Asthma    Burn of finger of right hand, second degree 02/05/2021   Occurred during cooking (frying), poor sensation due to neuropathy, pt punctured blister to allow it to drain, skin has since desquamated over dorsal joint, no infection.  Keep clean and dry, OTC antibacterial ointment.   Cataract    CHF (congestive heart failure) (HCC)    Chronic bronchitis (HCC)    I get it alot (09/28/2013)   Chronic diastolic heart failure (HCC)    grade 2 per 2D echocardiogram (01/2013)   Chronic kidney disease    Chronic lower back pain    Chronic pain syndrome 12/03/2011   Likely secondary to depression, fibromyalgia, neuropathy, and obesity. Lumbar MRI 2014 no sig change from prior (2008) : Stable hypertrophic facet disease most notable at L4-5. Stable shallow left foraminal/extraforaminal disc protrusion at L4-5. No direct neural compression.       Chronic right shoulder pain 10/10/2021   COPD 01/08/2007   PFT's 05/2007 : FEV1/FVC 82, FEV1 64% pred, FEF 25-75% 40% predicted, 16% improvement in FEV1 with bronchodilators.      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 Right brachial vein, diagnosed on 03/10/2013 Coumadin  for 3 months.  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   Headache    History of amputation of 4th and 5th toes right foot (HCC) 07/11/2021   Dr Janit jan 2018 2/2 osteo   History of amputation of right fifth toe 04/06/2024    History of bacterial endocarditis 2014   Endocarditis involving mitral and tricuspid valves.  S. Aureus and GBS.    History of use of hearing aid    Hyperlipidemia    Hyperplastic colon polyp 12/2010   Per colonoscopy (12/2010) - Dr. 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       Initially evaluated 08/2019 with dedicated MRI: IMPRESSION:  Skin marker overlies a 9 x 10 x 11 mm cyst within the anterior  aspect of the superficial lobe of the right parotid gland. This is  presumed to be a benign cyst. The possibility of a cystic Warthin's  tumor   Pneumonia    PVD (peripheral vascular disease) with claudication    Stents to bilateral common iliac arteries (left 2005, right 2008), on chronic plavix    Pyelonephritis 10/28/2020   S/P BKA (below knee amputation) unilateral (HCC)    2014 L - failed limb preserving treatment. 2/2 tobacco use, DM, and cont weight bearing on surgical wound and developed gangrene    Sepsis (HCC) 04/11/2024   Spinal stenosis    Subacute osteomyelitis of right foot (HCC) 03/25/2024   Tobacco abuse    Toe ulcer, right 4th (HCC) leading to osteomylitis 07/08/2021   Right fourth toe turned dark, alerting her to abnormality, it split open and drained.  Evaluated on 07/08/2021 by podiatrist Dr. Janit who debrided necrotic tissue and prescribed doxycycline .  He will see her again in 3 weeks.  The location of this ulcer on the dorsal aspect of the toe is somewhat atypical for a purely diabetic foot wound, and she does have a strong DP pulse.  I did not examine h   Type 2 diabetes mellitus with neurologic complication, with long-term current use of insulin  (HCC) 07/14/2024   Type II  diabetes mellitus with peripheral circulatory disorders, uncontrolled DX: 1993   Insulin  dep. Poor control. Complicated by diabetic foot ulcer and diabetic eye disease.     Upper respiratory tract infection due to COVID-19 virus 07/22/2022   Urinary incontinence, severe, mixed (stress, urge, functional) 02/14/2023   Unresponsive to medications    Past Surgical History:  Procedure Laterality Date   ABDOMINAL AORTOGRAM W/LOWER EXTREMITY N/A 03/28/2024   Procedure: ABDOMINAL AORTOGRAM W/LOWER EXTREMITY;  Surgeon: Pearline Norman RAMAN, MD;  Location: [facility] INVASIVE CV LAB;  Service: Cardiovascular;  Laterality: N/A;   ABDOMINAL HYSTERECTOMY  1997   secondary to uterine fibroids   AMPUTATION Left 08/31/2013   Procedure: AMPUTATION RAY;  Surgeon: Jerona LULLA Sage, MD;  Location: [facility] OR;  Service: Orthopedics;  Laterality: Left;  Left Foot 5th Ray Amputation   AMPUTATION Left 09/28/2013  Procedure: Left Midfoot amputation;  Surgeon: Jerona LULLA Sage, MD;  Location: [facility] OR;  Service: Orthopedics;  Laterality: Left;  Left Midfoot amputation   AMPUTATION Left 10/14/2013   Procedure: AMPUTATION BELOW KNEE- left;  Surgeon: Jerona LULLA Sage, MD;  Location: [facility] OR;  Service: Orthopedics;  Laterality: Left;  Left Below Knee Amputation    AMPUTATION Right 04/11/2024   Procedure: RIGHT AMPUTATION BELOW KNEE;  Surgeon: Lanis Fonda BRAVO, MD;  Location: [facility] OR;  Service: Vascular;  Laterality: Right;   AMPUTATION TOE Right 01/15/2017   Procedure: AMPUTATION 5th TOE RIGHT FOOT;  Surgeon: Thresa CHRISTELLA Sar, DPM;  Location: [facility] OR;  Service: Podiatry;  Laterality: Right;   AMPUTATION TOE Right 02/12/2023   Procedure: Right Foot Transmetatarsal Amputation;  Surgeon: Gershon Donnice SAUNDERS, DPM;  Location: [facility] OR;  Service: Podiatry;  Laterality: Right;   APPLICATION OF WOUND VAC  04/01/2019   Procedure: Application Of Wound Vac;  Surgeon: Sage Jerona LULLA, MD;  Location: [facility] OR;  Service: Orthopedics;;   BLADDER SURGERY     bladder reconstruction  surgery   BOTOX  INJECTION N/A 08/21/2021   Procedure: CYSTOSCOPY BOTOX  INJECTION;  Surgeon: Carolee Sherwood JONETTA DOUGLAS, MD;  Location: [facility] ORS;  Service: Urology;  Laterality: N/A;   BREAST BIOPSY     multiple-benign per pt   CATARACT EXTRACTION, BILATERAL     summer 2022   COLONOSCOPY     DILATION AND CURETTAGE OF UTERUS  1985   ESOPHAGOGASTRODUODENOSCOPY N/A 09/20/2013   Procedure: ESOPHAGOGASTRODUODENOSCOPY (EGD);  Surgeon: Gordy CHRISTELLA Starch, MD;  Location: [facility] ENDOSCOPY;  Service: Gastroenterology;  Laterality: N/A;   EYE SURGERY Bilateral 2022   Cataract removal in June and then July   FOOT AMPUTATION THROUGH METATARSAL Left 09/28/2013   GANGLION CYST EXCISION     multiple   LOWER EXTREMITY INTERVENTION Right 03/28/2024   Procedure: LOWER EXTREMITY INTERVENTION;  Surgeon: Pearline Norman RAMAN, MD;  Location: [facility] INVASIVE CV LAB;  Service: Cardiovascular;  Laterality: Right;   METATARSAL HEAD EXCISION Right 03/29/2024   Procedure: EXCISION, METATARSAL BONE, HEAD;  Surgeon: Malvin Marsa FALCON, DPM;  Location: [facility] OR;  Service: Orthopedics/Podiatry;  Laterality: Right;  Irrigation and Debridment right foot with partial fifth met resection, antibiotic beads, wound vac   PAROTIDECTOMY Right 05/14/2022   Procedure: PAROTIDECTOMY;  Surgeon: Carlie Clark, MD;  Location: [facility] OR;  Service: ENT;  Laterality: Right;   PERIPHERAL VASCULAR INTERVENTION     stents in lower ext   PREAURICULAR CYST EXCISION N/A 05/14/2022   Procedure: EXCISION OF SCALP SKIN CYST, 1.5cm;  Surgeon: Carlie Clark, MD;  Location: [facility] OR;  Service: ENT;  Laterality: N/A;   SHOULDER ARTHROSCOPY Right 11/11/2019   RIGHT SHOULDER ARTHROSCOPY AND DEBRIDEMENT    SHOULDER ARTHROSCOPY Right 11/11/2019   Procedure: RIGHT SHOULDER ARTHROSCOPY AND DEBRIDEMENT;  Surgeon: Sage Jerona LULLA, MD;  Location: [facility] OR;  Service: Orthopedics;  Laterality: Right;   SHOULDER ARTHROSCOPY W/ ROTATOR CUFF REPAIR Bilateral    2 on right one on left   SKIN SPLIT GRAFT  Bilateral 05/13/2013   Procedure: Right and Left Foot Allograft Skin Graft;  Surgeon: Jerona LULLA Sage, MD;  Location: [facility] OR;  Service: Orthopedics;  Laterality: Bilateral;  Right and Left Foot Allograft Skin Graft   STUMP REVISION Left 04/01/2019   Procedure: REVISION LEFT BELOW KNEE AMPUTATION;  Surgeon: Sage Jerona LULLA, MD;  Location: [facility] OR;  Service: Orthopedics;  Laterality: Left;   TEE WITHOUT CARDIOVERSION N/A 01/31/2013   Procedure: TRANSESOPHAGEAL ECHOCARDIOGRAM (TEE);  Surgeon: Vina LULLA Gull, MD;  Location: [facility] ENDOSCOPY;  Service: Cardiovascular;  Laterality: N/A;  Rm [id number]   TEE WITHOUT CARDIOVERSION N/A 03/10/2013   Procedure: TRANSESOPHAGEAL ECHOCARDIOGRAM (TEE);  Surgeon: Ezra GORMAN Shuck, MD;  Location: [facility] ENDOSCOPY;  Service: Cardiovascular;  Laterality: N/A;  Rm. [id number]   TOE AMPUTATION Left 08/31/2013   4TH & 5 TH TOE    TONSILLECTOMY     TRANSESOPHAGEAL ECHOCARDIOGRAM (CATH LAB) N/A 07/19/2024   Procedure: TRANSESOPHAGEAL ECHOCARDIOGRAM;  Surgeon: Mona Vinie BROCKS, MD;  Location: [facility] INVASIVE CV LAB;  Service: Cardiovascular;  Laterality: N/A;   TUBAL LIGATION     WRIST SURGERY Right    for tumors (09/28/2013)    Current Medications: No outpatient medications have been marked as taking for the 10/25/24 encounter (Appointment) with Vicci Rollo SAUNDERS, PA-C.     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   Vaping Use   Vaping status: Never Used  Substance and Sexual Activity   Alcohol use: No    Alcohol/week: 0.0 standard drinks of alcohol   Drug use: No    Types: Marijuana, Crack cocaine    Comment: 09/28/2013 no marijuana since 2011, no crack/cocaine 1989   Sexual activity: Not Currently  Other Topics Concern   Not on file  Social History Narrative   On disability. Lives with son in [place]. 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    Boyfriend passed away stage 4 cancer 03-19-2013.   S/p L BKA 2014. In wheelchair in paritially suitable apartment.    Social Drivers of Corporate Investment Banker Strain: High Risk (03/31/2023)   Overall Financial Resource Strain (CARDIA)    Difficulty of Paying Living Expenses: Hard  Food Insecurity: Food Insecurity Present (09/22/2024)   Hunger Vital Sign    Worried About Running Out of Food in the Last Year: Sometimes true    Ran Out of Food in the Last Year: Sometimes true  Transportation Needs: Patient Unable To Answer (07/15/2024)   PRAPARE - Transportation    Lack of Transportation (Medical): Patient unable to answer    Lack of Transportation (Non-Medical): Patient unable to answer  Physical Activity: Inactive (03/31/2023)   Exercise Vital Sign    Days of Exercise per Week: 0 days    Minutes of Exercise per Session: 0 min  Stress: Stress Concern Present (03/31/2023)   [employer] of Occupational Health - Occupational Stress Questionnaire    Feeling of Stress : Very much  Social Connections: Patient Unable To Answer (07/15/2024)   Social Connection and Isolation Panel    Frequency of Communication with Friends and Family: Patient unable to answer    Frequency of Social Gatherings with Friends and Family: Patient unable to answer  Attends Religious Services: Patient unable to answer    Active Member of Clubs or  Organizations: Patient unable to answer    Attends Club or Organization Meetings: Patient unable to answer    Marital Status: Patient unable to answer     Family History: The patient's ***family history includes Asthma in her father; CAD (age of onset: 41) in her sister; Colon cancer in her brother; Congestive Heart Failure in her mother; Diabetes in her mother; Diverticulosis in her mother; Heart disease in her sister; Hypertension in her mother. There is no history of Breast cancer, Colon polyps, Rectal cancer, Stomach cancer, or Esophageal cancer.  EKGs/Labs/Other Studies Reviewed:    The following studies were reviewed today: ***      Recent Labs: 07/14/2024: ALT 14; TSH 0.162 07/19/2024: Magnesium  1.8 07/20/2024: BUN 14; Creatinine, Ser 0.87; Hemoglobin 11.4; Platelets 200; Potassium 4.8; Sodium 132  Recent Lipid Panel    Component Value Date/Time   CHOL 85 07/18/2024 0606   CHOL 115 03/31/2023 1151   TRIG 64 07/18/2024 0606   HDL 36 (L) 07/18/2024 0606   HDL 45 03/31/2023 1151   CHOLHDL 2.4 07/18/2024 0606   VLDL 13 07/18/2024 0606   LDLCALC 36 07/18/2024 0606   LDLCALC 55 03/31/2023 1151     Risk Assessment/Calculations:   {Does this patient have ATRIAL FIBRILLATION?[phone]}  No BP recorded.  {Refresh Note OR Click here to enter BP  :1}***         Physical Exam:    VS:  There were no vitals taken for this visit.       Wt Readings from Last 3 Encounters:  07/20/24 185 lb 10 oz (84.2 kg)  05/19/24 220 lb (99.8 kg)  04/11/24 220 lb 0.3 oz (99.8 kg)     GEN: *** Well nourished, well developed in no acute distress HEENT: Normal NECK: No JVD; No carotid bruits CARDIAC: *** S1-S2 normal, RRR, no murmurs, rubs, gallops RESPIRATORY:  Clear to auscultation without rales, wheezing or rhonchi  MUSCULOSKELETAL:  No edema; No deformity  SKIN: Warm and dry NEUROLOGIC:  Alert and oriented x 3 PSYCHIATRIC:  Normal affect       Assessment & Plan Cerebrovascular  accident (CVA), unspecified mechanism (HCC) PFO (patent foramen ovale) Bilateral carotid artery stenosis Had CVA in 06/2024.  Echo showed EF 65-70%, no regional wall motion abnormalities, mild LVH, grade 1 DD, normal RV systolic function, no significant valvular abnormalities.  Carotid duplex 07/18/2024 showed 1-39% stenosis in bilateral ICAs.  TEE 07/19/2024 showed EF 65 to 70%, mild LVH, normal RV systolic function, no left atrial appendage thrombus noted.  Trivial MR. There was a small patent ovale noted. Working with Rollo Louder, PA today, who briefly discussed this patient with Dr. Wonda. Considering she has multiple risk factors for CVA including PAD, type 2 DM, HLD, it is very unlikely PFO was etiology of her CVA  Continue aspirin  81 mg daily as prescribed by her PCP Continue Crestor  20 mg daily as prescribed by her PCP Peripheral arterial disease Patient has bilateral BKAs Continue aspirin  81 mg daily as prescribed by her PCP Continue Crestor  20 mg daily as prescribed by her PCP Hyperlipidemia, unspecified hyperlipidemia type        {Are you ordering a CV Procedure (e.g. stress test, cath, DCCV, TEE, etc)?   Press F2        :[id number]}    Medication Adjustments/Labs and Tests Ordered: Current medicines are reviewed at length with the patient today.  Concerns  regarding medicines are outlined above.  No orders of the defined types were placed in this encounter.  No orders of the defined types were placed in this encounter.   There are no Patient Instructions on file for this visit.   Signed, Miriam FORBES Shams, NP  10/25/2024 12:51 PM    [facility]

## 2024-10-12 ENCOUNTER — Other Ambulatory Visit: Payer: Self-pay

## 2024-10-13 ENCOUNTER — Other Ambulatory Visit: Payer: Self-pay

## 2024-10-13 ENCOUNTER — Other Ambulatory Visit

## 2024-10-13 ENCOUNTER — Other Ambulatory Visit (HOSPITAL_COMMUNITY): Payer: Self-pay

## 2024-10-13 ENCOUNTER — Telehealth: Payer: Self-pay

## 2024-10-13 DIAGNOSIS — Z794 Long term (current) use of insulin: Secondary | ICD-10-CM

## 2024-10-13 DIAGNOSIS — E1142 Type 2 diabetes mellitus with diabetic polyneuropathy: Secondary | ICD-10-CM

## 2024-10-13 NOTE — Telephone Encounter (Signed)
 Attempted to contact patient for scheduled appointment for medication management. Left HIPAA compliant message for patient to return my call at their convenience.   Lorain Baseman, PharmD [facility] [phone]

## 2024-10-13 NOTE — Progress Notes (Signed)
 10/13/2024 Name: Jennifer [name] MRN: [id number] DOB: [birth year]  Chief Complaint  Patient presents with   Diabetes    Jennifer BERMINGHAM is a 63 y.o. year old female who presented for a telephone visit.   They were referred to the pharmacist by a quality report for assistance in managing diabetes. PMH includes PAD s/p stenting in 2005 and 2008 and R BKA (2024), HFpEF (EF 65-75% with mild LVH), CVA (July 2025), COPD, GERD, T2DM with gastroparesis, HLD, retinopathy, glaucoma, depression, tobacco use, BMI > 30, mild cognitive impairment.  Patient was identified as falling into the [place] Measure - Diabetes.   Patient was: Referred to pharmacy for chronic disease management.    Subjective: Patient was seen by PCP, Mliss Pouch, MD, on 02/18/24. At last visit, diabetes continued to be poorly controlled, but unable to increase her GLP-1 or metformin  dose due to s/sx of gastroparesis. Patient also has chronic perineal candidiasis, so is not a candidate for SGLT2i. She also reported having poor living conditions, which exacerbates management of her chronic conditions. Since then, she has had multiple ED admissions. In April 2025, she was admitted for osteomyelitis eventually requiring R BKA. She was admitted again in July 2024 with confusion and AMS and found to have acute posterior L MCA infarct. She was initiated on aspirin  and clopidogrel  (3 weeks of DAPT). A1C found to be 9.8%. PTA she was taking basal insulin  65 units daily with metformin  and Ozempic . At discharge she was instructed to decrease insulin  degludec to 25 units daily. Per inpatient DM note, patient reported drinking regular sodas daily and not making healthy food choices. She lives with her son, but he does not assist her with her medications. She has not been able to attend her follow-up appointments. She was engaged by pharmacy via telephone on 09/05/24. Discussed dietary interventions to help lower blood sugars. Continued current  regimen (patient had self-resume higher dose of Tresiba  65 units nightly), but encouraged use of Dexcom with receiver, which I requested to be mailed out from the pharmacy. She was seen by Dr. Pouch on 09/22/24 - said she was using Dexcom but forgot to bring receiver. Displayed significant difficulty with ADLs. BP was elevated to 146/67 mmHg, but she had not taken her medications that morning.  Today, patient reports doing ok. She says she is wearing a Dexcom and has receiver nearby, but she is not able to get to it to review recent report while we were on the phone.   Care Team: Primary Care Provider: Pouch Mliss Dragon, MD ; Next Scheduled Visit: needs to be scheduled early-mid Novemeber Cardiologist: Rollo Louder, PA; Next Scheduled Visit: 09/07/24  Medication Access/Adherence  Current Pharmacy:  DARRYLE LONG - [facility] [address] [place] [address] Phone: [phone] Fax: [phone]  [facility] [address] [place] [address] Phone: [phone] Fax: [phone]   Patient reports affordability concerns with their medications: No  - [employer] Dual Complete - $0 copays  Patient reports access/transportation concerns to their pharmacy: No  - Using [facility] mail order (compliance packing)  Patient reports adherence concerns with their medications:  Yes  - Denies missed doses of oral medications, insulin , or Ozempic  - however there is discrepancy in her insulin  fill hx - Tresiba  U200 filled 06/27/24 for 27ds, 09/06/24 for 28ds  Diabetes:  Current medications: Tresiba  65 units daily, metformin  XR 500 mg daily, Ozempic  1 mg weekly (Mondays) Previously tried: higher doses of  metformin  were questionably tolerated (difficult to assess with possible gastroparesis)  Denies GI AE with Ozempic , metformin .  Current glucose readings: States she is now wearing her Dexcom. Received new receiver and has been using it, but she is not  able to get to it to review recent numbers while we are on the phone. States BG have typically been running in the 200s Denies low sugars less than 70 mg/dL Previously stated she has had more difficulty with technology since her stroke. In the past was connected in [employer] with her Android phone - should revisit cell phone connection if possible for remote monitoring  Patient denies hypoglycemic s/sx including dizziness, shakiness, sweating. Patient denies hyperglycemic symptoms including polyuria, polydipsia, polyphagia, nocturia, neuropathy, blurred vision.  Current meal patterns: 2 meals/day - Snacks - does not eat a lot of sweets or candy. - Drinks - Drinking regular soda (caffeine free orange soda) every day - her son purchases this from [employer] for her. No change in soda intake reported today. States she tried Psychologist, counselling free options and did not like it.   Hypertension:  Current medications: amlodipine  10 mg daily, spironolactone  50 mg daily Medications previously tried: metoprolol  succinate (d/c at discharge July 2025)  Patient has an automated, upper arm home BP cuff - not checking. Not sure if it works.  Patient denies hypotensive s/sx including dizziness, lightheadedness. Patient denies hypertensive symptoms including headache, chest pain, shortness of breath  Current physical activity: limited s/p stroke, also bilateral BKA. Reports she can get into her wheelchair from the bed, but cannot get back into bed. After last visit with Dr. Trudy, working on getting more DME to help with ADLs.   Hyperlipidemia/ASCVD Risk Reduction  Current lipid lowering medications: rosuvastatin  20 mg daily  Antiplatelet regimen: aspirin  81 mg daily  ASCVD History: CVA in 2025  Objective:  BP Readings from Last 3 Encounters:  09/22/24 (!) 146/67  07/20/24 (!) 161/84  05/19/24 (!) 145/82    Lab Results  Component Value Date   HGBA1C 8.8 (H) 07/18/2024   HGBA1C 9.8 (H) 04/06/2024    HGBA1C 9.2 (A) 02/18/2024       Latest Ref Rng & Units 07/20/2024   12:46 PM 07/19/2024    5:02 AM 07/18/2024    6:06 AM  BMP  Glucose 70 - 99 mg/dL 697  768  811   BUN 8 - 23 mg/dL 14  9  <5   Creatinine 0.44 - 1.00 mg/dL 9.12  9.12  9.12   Sodium 135 - 145 mmol/L 132  137  137   Potassium 3.5 - 5.1 mmol/L 4.8  4.9  4.0   Chloride 98 - 111 mmol/L 101  104  104   CO2 22 - 32 mmol/L 22  23  22    Calcium  8.9 - 10.3 mg/dL 9.2  9.1  8.9     Lab Results  Component Value Date   CHOL 85 07/18/2024   HDL 36 (L) 07/18/2024   LDLCALC 36 07/18/2024   TRIG 64 07/18/2024   CHOLHDL 2.4 07/18/2024    Medications Reviewed Today     Reviewed by Brinda Lorain SQUIBB, RPH (Pharmacist) on 10/13/24 at 1123  Med List Status: <None>   Medication Order Taking? Sig Documenting Provider Last Dose Status Informant  albuterol  (PROAIR  HFA) 108 (90 Base) MCG/ACT inhaler [id number]  Inhale 2 puffs into the lungs every 6 (six) hours as needed for wheezing. Trudy Mliss Dragon, MD  Active Pharmacy Records, Family Member, Multiple Informants  amLODipine  (NORVASC ) 10 MG tablet [id number] Yes Take 1 tablet (10 mg total) by mouth daily. Trudy Mliss Dragon, MD  Active   aspirin  EC 81 MG tablet [id number] Yes Take 1 tablet (81 mg total) by mouth daily. Swallow whole. Trudy Mliss Dragon, MD  Active   Continuous Glucose Receiver [facility] G7 RECEIVER) [place] [id number] Yes Use as directed to monitor blood glucose using Dexcom G7 sensors. Trudy Mliss Dragon, MD  Active   Continuous Glucose Sensor [facility] G7 [place]) [place] [id number] Yes Place new sensor every 10 days. Use to monitor blood sugar continuously. Trudy Mliss Dragon, MD  Active Pharmacy Records, Family Member, Multiple Informants  DULoxetine  (CYMBALTA ) 60 MG capsule [id number] Yes Take 1 capsule (60 mg total) by mouth daily. Trudy Mliss Dragon, MD  Active Pharmacy Records, Family Member, Multiple Informants  fluticasone  (FLONASE ) 50 MCG/ACT nasal spray [id number]   Place 1 spray into both nostrils 2 (two) times daily for allergies.   Active Pharmacy Records, Family Member, Multiple Informants  insulin  degludec (TRESIBA  FLEXTOUCH) 200 UNIT/ML FlexTouch Pen [id number] Yes Inject 64 Units into the skin daily. Trudy Mliss Dragon, MD  Active   Insulin  Pen Needle 32G X 4 MM MISC [id number]  Use to inject insulin  4 (four) times daily. Trudy Mliss Dragon, MD  Active Pharmacy Records, Family Member, Multiple Informants  lidocaine  (LIDODERM ) 5 % [id number]  Place 1 patch onto the skin daily. Remove & Discard patch within 12 hours or as directed by MD Doretha Folks, MD  Active Pharmacy Records, Family Member, Multiple Informants  metFORMIN  (GLUCOPHAGE -XR) 500 MG 24 hr tablet [id number] Yes Take 1 tablet (500 mg total) by mouth daily with breakfast. Trudy Mliss Dragon, MD  Active   MYRBETRIQ  50 MG TB24 tablet [id number]  Take 1 tablet (50 mg total) by mouth daily. Trudy Mliss Dragon, MD  Active   ondansetron  (ZOFRAN ) 4 MG tablet [id number]  Take 1 tablet (4 mg total) by mouth every 8 (eight) hours as needed for nausea and vomiting.   Active Pharmacy Records, Family Member, Multiple Informants  oxyCODONE -acetaminophen  (PERCOCET) 7.5-325 MG tablet [id number] Yes Take 1 tablet by mouth 3 (three) times daily as needed for severe pain (pain score 7-10). Trudy Mliss Dragon, MD  Active   pantoprazole  (PROTONIX ) 40 MG tablet [id number]  Take 1 tablet (40 mg total) by mouth daily for GERD. Trudy Mliss Dragon, MD  Active     Discontinued 01/23/14 1328 (Reorder)   pregabalin  (LYRICA ) 75 MG capsule [id number] Yes Take 1 capsule (75 mg total) by mouth 3 (three) times daily. Trudy Mliss Dragon, MD  Active   rosuvastatin  (CRESTOR ) 20 MG tablet [id number] Yes Take 1 tablet (20 mg total) by mouth in the morning. Trudy Mliss Dragon, MD  Active Pharmacy Records, Family Member, Multiple Informants  Semaglutide , 1 MG/DOSE, (OZEMPIC , 1 MG/DOSE,) 4 MG/3ML SOPN [id number] Yes Inject 1 mg  into the skin once a week. Trudy Mliss Dragon, MD  Active Pharmacy Records, Family Member, Multiple Informants  spironolactone  (ALDACTONE ) 50 MG tablet [id number] Yes Take 1 tablet (50 mg total) by mouth daily. Trudy Mliss Dragon, MD  Active   umeclidinium-vilanterol (ANORO ELLIPTA ) 62.5-25 MCG/ACT AEPB [id number] Yes Inhale 1 puff into the lungs in the morning. Trudy Mliss Dragon, MD  Active Pharmacy Records, Family Member, Multiple Informants              Assessment/Plan:   Diabetes: - Currently uncontrolled with most recent A1C of 8.8% above goal <7%, but improved from 9.8%. Patient continues to tolerate  GLP-1RA despite dx of gastroparesis. She is not having s/sx of hypoglycemia with current dose of basal insulin . Denies missed doses of insulin , but fill hx indicates possible missed doses. Will collaborate with pharmacy to request fill today. - Last UACR April 2024 - 47 mg/g. She is not a candidate for SGLT2i given chronic yeast infections. - Reviewed long term cardiovascular and renal outcomes of uncontrolled blood sugar - Reviewed goal A1c, goal fasting, and goal 2 hour post prandial glucose - Reviewed hypoglycemia management plan and the rule of 15 - Reviewed dietary modifications including  utilizing the healthy plate method, limiting portion size of carbohydrate foods, increasing intake of protein and non-starchy vegetables. Counseled patient to stay hydrated with water  throughout the day. Again discussed that it will be very difficult to control her BG while she is drinking regular soda, encouraged trying alternative non-sugar options. - Recommend to continue Tresiba  U200 65 units daily, Ozempic  1 mg daily, metformin  XR 500 mg daily  - Collaborated with pharmacy to refill Tresiba  today and put on automatic refill - Could consider trial of metformin  XR 500 mg BID at follow-up if A1C is worsened, though would need to monitor closely for increased GI symptoms - Recommend to check  glucose continuously with Dexcom G7. Assisted in setting up follow-up with [facility] for receiver download per patient preference. - Next A1C due now    Hypertension: - Currently uncontrolled with clinic BP consistently above goal less than 130/80, in the setting of forgetting medications. It is difficult for her to monitor at home. Patient is not having s/sx of hypo- or hyper-tension. She has a hx of swelling with benazepril  so cannot take ACEi/ARB due to c/f angioedema. - Recommend to continue amlodipine  10 mg daily, spironolactone  50 mg daily    Hyperlipidemia/ASCVD Risk Reduction: - Currently controlled with most recent LDL-C of 36 mg/dL below goal < 55 mg/dL given premature ASCVD (PAD, CVA). High intensity statin indicated. - Recommend to continue rosuvastatin  20 mg daily, aspirin  81 mg daily    Patient verbalized understanding of treatment plan.    Follow Up Plan:  Pharmacist telephone 11/10/24 CDCES 11/02/24 PCP clinic visit needs to be scheduled once open for Dec appts   Lorain Baseman, PharmD [facility] [phone]

## 2024-10-17 DIAGNOSIS — E1143 Type 2 diabetes mellitus with diabetic autonomic (poly)neuropathy: Secondary | ICD-10-CM | POA: Diagnosis not present

## 2024-10-17 DIAGNOSIS — R279 Unspecified lack of coordination: Secondary | ICD-10-CM | POA: Diagnosis not present

## 2024-10-17 DIAGNOSIS — R2689 Other abnormalities of gait and mobility: Secondary | ICD-10-CM | POA: Diagnosis not present

## 2024-10-17 DIAGNOSIS — M86171 Other acute osteomyelitis, right ankle and foot: Secondary | ICD-10-CM | POA: Diagnosis not present

## 2024-10-17 DIAGNOSIS — Z89431 Acquired absence of right foot: Secondary | ICD-10-CM | POA: Diagnosis not present

## 2024-10-18 ENCOUNTER — Other Ambulatory Visit: Payer: Self-pay

## 2024-10-19 ENCOUNTER — Other Ambulatory Visit (HOSPITAL_BASED_OUTPATIENT_CLINIC_OR_DEPARTMENT_OTHER): Payer: Self-pay

## 2024-10-19 ENCOUNTER — Other Ambulatory Visit: Payer: Self-pay

## 2024-10-25 ENCOUNTER — Ambulatory Visit: Attending: Cardiology | Admitting: Cardiology

## 2024-10-30 ENCOUNTER — Other Ambulatory Visit: Payer: Self-pay

## 2024-10-31 ENCOUNTER — Other Ambulatory Visit: Payer: Self-pay

## 2024-10-31 ENCOUNTER — Other Ambulatory Visit: Payer: Self-pay | Admitting: Internal Medicine

## 2024-10-31 ENCOUNTER — Other Ambulatory Visit (HOSPITAL_COMMUNITY): Payer: Self-pay

## 2024-10-31 DIAGNOSIS — F39 Unspecified mood [affective] disorder: Secondary | ICD-10-CM

## 2024-10-31 DIAGNOSIS — G547 Phantom limb syndrome without pain: Secondary | ICD-10-CM

## 2024-11-01 ENCOUNTER — Other Ambulatory Visit: Payer: Self-pay

## 2024-11-01 ENCOUNTER — Other Ambulatory Visit: Payer: Self-pay | Admitting: Internal Medicine

## 2024-11-01 ENCOUNTER — Other Ambulatory Visit (HOSPITAL_COMMUNITY): Payer: Self-pay

## 2024-11-01 DIAGNOSIS — G547 Phantom limb syndrome without pain: Secondary | ICD-10-CM

## 2024-11-01 DIAGNOSIS — Z794 Long term (current) use of insulin: Secondary | ICD-10-CM

## 2024-11-01 DIAGNOSIS — K219 Gastro-esophageal reflux disease without esophagitis: Secondary | ICD-10-CM

## 2024-11-01 MED ORDER — DEXCOM G7 SENSOR MISC
3 refills | Status: AC
  Filled 2024-11-01: qty 9, 90d supply, fill #0

## 2024-11-01 NOTE — Telephone Encounter (Signed)
 Medication sent to pharmacy

## 2024-11-01 NOTE — Telephone Encounter (Unsigned)
 Copied from CRM [id number]. Topic: Clinical - Medication Refill >> Nov 01, 2024  3:20 PM Suzette B wrote: Medication:  oxyCODONE -acetaminophen  (PERCOCET) 7.5-325 MG tablet  Has the patient contacted their pharmacy? Yes Pharmacy advised pt to call an request the medication due to the nature of it being a controlled substance.   This is the patient's preferred pharmacy:  DARRYLE LONG - [facility] [address] [place] [address] Phone: [phone] Fax: [phone]   Is this the correct pharmacy for this prescription? Yes If no, delete pharmacy and type the correct one.   Has the prescription been filled recently? Yes  Is the patient out of the medication? No; patient states she has one pill left and has not taking any medication yet today   Has the patient been seen for an appointment in the last year OR does the patient have an upcoming appointment? Yes  Can we respond through MyChart? Yes  Agent: Please be advised that Rx refills may take up to 3 business days. We ask that you follow-up with your pharmacy.

## 2024-11-02 ENCOUNTER — Ambulatory Visit

## 2024-11-02 ENCOUNTER — Ambulatory Visit: Admitting: Dietician

## 2024-11-02 ENCOUNTER — Other Ambulatory Visit: Payer: Self-pay

## 2024-11-02 NOTE — Progress Notes (Deleted)
 CC: 4-6 weeks, for chronic condition monitoring, symptom re-check, med review.   HPI:  Jennifer [name] is a 63 y.o. female with pertinent past medical history of type 2 diabetes, history of CVA (dysarthria and expressive language changes), s/p bilateral BKA, urinary incontinence mixed, HTN, polypharmacy, PAD (further medical history stated below) and presents today for 4-6-week follow-up. Please see problem based assessment and plan for additional details.  Last clinic appointment: 09/22/2024:[name] needs more help.  She hasn't had a shower since 03/2024 (2nd LE amputation), or a bath.   She does not have a transfer board and is finding it difficult to manage ADLs, particularly to use the bathroom (she has severe neuropathy in her hands which makes use of her arms/hands for weightbearing more challenging).  She has not yet received new leg prosthesis.   Functional Status: Continues to live at home with several family members, including her son Jennifer [name].  She is now a bilateral amputee.  Not able to bathe, though dresses independently.  She is dependent on others for transportation and shopping.  Unable to participate in housekeeping.  Medicines are packaged and adherence packets and delivered.   Last Pertinent Labs Documented:     Latest Ref Rng & Units 07/20/2024   12:46 PM 07/19/2024    5:02 AM 07/18/2024    6:06 AM  BMP  Glucose 70 - 99 mg/dL 697  768  811   BUN 8 - 23 mg/dL 14  9  <5   Creatinine 0.44 - 1.00 mg/dL 9.12  9.12  9.12   Sodium 135 - 145 mmol/L 132  137  137   Potassium 3.5 - 5.1 mmol/L 4.8  4.9  4.0   Chloride 98 - 111 mmol/L 101  104  104   CO2 22 - 32 mmol/L 22  23  22    Calcium  8.9 - 10.3 mg/dL 9.2  9.1  8.9        Latest Ref Rng & Units 07/20/2024   12:46 PM 07/19/2024    5:02 AM 07/18/2024    6:06 AM  CBC  WBC 4.0 - 10.5 K/uL 7.8  6.6  7.1   Hemoglobin 12.0 - 15.0 g/dL 88.5  89.2  89.5   Hematocrit 36.0 - 46.0 % 36.6  35.2  32.9   Platelets 150 - 400 K/uL 200   184  166     Lab Results  Component Value Date   HGBA1C 8.8 (H) 07/18/2024   HGBA1C 9.8 (H) 04/06/2024   HGBA1C 9.2 (A) 02/18/2024     Past Medical History:  Diagnosis Date   Abnormality of gait and recurrent falls 03/01/2013   Acute metabolic encephalopathy 07/20/2024   Acute vestibular syndrome, resolved 03/02/2021   Angioedema 06/14/2021   Asthma    Burn of finger of right hand, second degree 02/05/2021   Occurred during cooking (frying), poor sensation due to neuropathy, pt punctured blister to allow it to drain, skin has since desquamated over dorsal joint, no infection.  Keep clean and dry, OTC antibacterial ointment.   Cataract    CHF (congestive heart failure) (HCC)    Chronic bronchitis (HCC)    I get it alot (09/28/2013)   Chronic diastolic heart failure (HCC)    grade 2 per 2D echocardiogram (01/2013)   Chronic kidney disease    Chronic lower back pain    Chronic pain syndrome 12/03/2011   Likely secondary to depression, fibromyalgia, neuropathy, and obesity. Lumbar MRI 2014 no sig change from prior (  2008) : Stable hypertrophic facet disease most notable at L4-5. Stable shallow left foraminal/extraforaminal disc protrusion at L4-5. No direct neural compression.       Chronic right shoulder pain 10/10/2021   COPD 01/08/2007   PFT's 05/2007 : FEV1/FVC 82, FEV1 64% pred, FEF 25-75% 40% predicted, 16% improvement in FEV1 with bronchodilators.      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 Right brachial vein, diagnosed on 03/10/2013 Coumadin  for 3 months. End date 06/10/2013    Environmental allergies    Hx: of   Fatty liver 2003   observed on ultrasound abdomen   Fibromyalgia    GERD (gastroesophageal reflux disease)    Glaucoma    H/O  above knee amputation, left (HCC) 07/11/2021   Revision in 2020 to AKA from BKA 2014.   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  S. Aureus and GBS.    History of use of hearing aid    Hyperlipidemia    Hyperplastic colon polyp 12/2010   Per colonoscopy (12/2010) - Dr. 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       Initially evaluated 08/2019 with dedicated MRI: IMPRESSION:  Skin marker overlies a 9 x 10 x 11 mm cyst within the anterior  aspect of the superficial lobe of the right parotid gland. This is  presumed to be a benign cyst. The possibility of a cystic Warthin's  tumor   Pneumonia    PVD (peripheral vascular disease) with claudication    Stents to bilateral common iliac arteries (left 2005, right 2008), on chronic plavix    Pyelonephritis 10/28/2020   S/P BKA (below knee amputation) unilateral (HCC)    2014 L - failed limb preserving treatment. 2/2 tobacco use, DM, and cont weight bearing on surgical wound and developed gangrene    Sepsis (HCC) 04/11/2024   Spinal stenosis    Subacute osteomyelitis of right foot (HCC) 03/25/2024   Tobacco abuse    Toe ulcer, right 4th (HCC) leading to osteomylitis 07/08/2021   Right fourth toe turned dark, alerting her to abnormality, it split open and drained.  Evaluated on 07/08/2021 by podiatrist Dr. Janit who debrided necrotic tissue and prescribed doxycycline .  He will see her again in 3 weeks.  The location of this ulcer on the  dorsal aspect of the toe is somewhat atypical for a purely diabetic foot wound, and she does have a strong DP pulse.  I did not examine h   Type 2 diabetes mellitus with neurologic complication, with long-term current use of insulin  (HCC) 07/14/2024   Type II diabetes mellitus with peripheral circulatory disorders, uncontrolled DX: 1993   Insulin  dep. Poor control. Complicated by diabetic foot ulcer and diabetic eye disease.  Upper respiratory tract infection due to COVID-19 virus 07/22/2022   Urinary incontinence, severe, mixed (stress, urge, functional) 02/14/2023   Unresponsive to medications    Current Outpatient Medications on File Prior to Visit  Medication Sig Dispense Refill   albuterol  (PROAIR  HFA) 108 (90 Base) MCG/ACT inhaler Inhale 2 puffs into the lungs every 6 (six) hours as needed for wheezing. 20.1 g 3   amLODipine  (NORVASC ) 10 MG tablet Take 1 tablet (10 mg total) by mouth daily. 90 tablet 3   aspirin  EC 81 MG tablet Take 1 tablet (81 mg total) by mouth daily. Swallow whole. 30 tablet 2   Continuous Glucose Receiver (DEXCOM G7 RECEIVER) DEVI Use as directed to monitor blood glucose using Dexcom G7 sensors. 1 each 0   Continuous Glucose Sensor (DEXCOM G7 SENSOR) MISC Place new sensor every 10 days. Use to monitor blood sugar continuously. 9 each 3   DULoxetine  (CYMBALTA ) 60 MG capsule Take 1 capsule (60 mg total) by mouth daily. 90 capsule 3   fluticasone  (FLONASE ) 50 MCG/ACT nasal spray Place 1 spray into both nostrils 2 (two) times daily for allergies. 16 g 0   insulin  degludec (TRESIBA  FLEXTOUCH) 200 UNIT/ML FlexTouch Pen Inject 64 Units into the skin daily. 9 mL 3   Insulin  Pen Needle 32G X 4 MM MISC Use to inject insulin  4 (four) times daily. 100 each 10   lidocaine  (LIDODERM ) 5 % Place 1 patch onto the skin daily. Remove & Discard patch within 12 hours or as directed by MD 30 patch 0   metFORMIN  (GLUCOPHAGE -XR) 500 MG 24 hr tablet Take 1 tablet (500 mg total) by mouth  daily with breakfast. 30 tablet 11   MYRBETRIQ  50 MG TB24 tablet Take 1 tablet (50 mg total) by mouth daily. 90 tablet 3   ondansetron  (ZOFRAN ) 4 MG tablet Take 1 tablet (4 mg total) by mouth every 8 (eight) hours as needed for nausea and vomiting. 30 tablet 0   oxyCODONE -acetaminophen  (PERCOCET) 7.5-325 MG tablet Take 1 tablet by mouth 3 (three) times daily as needed for severe pain (pain score 7-10). 90 tablet 0   pantoprazole  (PROTONIX ) 40 MG tablet Take 1 tablet (40 mg total) by mouth daily for GERD. 90 tablet 3   pregabalin  (LYRICA ) 75 MG capsule Take 1 capsule (75 mg total) by mouth 3 (three) times daily. 270 capsule 3   rosuvastatin  (CRESTOR ) 20 MG tablet Take 1 tablet (20 mg total) by mouth in the morning. 90 tablet 3   Semaglutide , 1 MG/DOSE, (OZEMPIC , 1 MG/DOSE,) 4 MG/3ML SOPN Inject 1 mg into the skin once a week. 3 mL 12   spironolactone  (ALDACTONE ) 50 MG tablet Take 1 tablet (50 mg total) by mouth daily. 90 tablet 3   umeclidinium-vilanterol (ANORO ELLIPTA ) 62.5-25 MCG/ACT AEPB Inhale 1 puff into the lungs in the morning. 120 each 11   [DISCONTINUED] Potassium Chloride  ER 20 MEQ TBCR Take 40 mEq by mouth daily. 30 tablet 0   No current facility-administered medications on file prior to visit.    Family History  Problem Relation Age of Onset   Diverticulosis Mother    Diabetes Mother    Hypertension Mother    Congestive Heart Failure Mother    Asthma Father    CAD Sister 54       MI at age 67 per patient.  However, she has not had a stent or CABG.    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   Vaping Use   Vaping status: Never Used  Substance and Sexual Activity   Alcohol use: No    Alcohol/week: 0.0 standard drinks of alcohol   Drug use: No    Types: Marijuana, Crack cocaine    Comment: 09/28/2013 no marijuana since 2011, no crack/cocaine 1989   Sexual activity: Not Currently  Other Topics Concern   Not on file  Social History Narrative   On disability. Lives with son in [place]. 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    Boyfriend passed away stage 4 cancer 09-Mar-2013.   S/p L BKA 2014. In wheelchair in paritially suitable apartment.    Social Drivers of Corporate Investment Banker Strain: High Risk (03/31/2023)   Overall Financial Resource Strain (CARDIA)    Difficulty of Paying Living Expenses: Hard  Food Insecurity: Food Insecurity Present (09/22/2024)   Hunger Vital Sign    Worried About Running Out of Food in the Last Year: Sometimes true    Ran Out of Food in the Last Year: Sometimes true  Transportation Needs: Patient Unable To Answer (07/15/2024)   PRAPARE - Transportation    Lack of Transportation (Medical): Patient unable to answer    Lack of Transportation (Non-Medical): Patient unable to answer  Physical Activity: Inactive (03/31/2023)   Exercise Vital Sign    Days of Exercise per Week: 0 days    Minutes of Exercise per Session: 0 min  Stress: Stress Concern Present (03/31/2023)   [employer] of Occupational Health - Occupational Stress Questionnaire    Feeling of Stress : Very much  Social Connections: Patient Unable To Answer (07/15/2024)   Social Connection and Isolation Panel    Frequency of Communication with  Friends and Family: Patient unable to answer    Frequency of Social Gatherings with Friends and Family: Patient unable to answer    Attends Religious Services: Patient unable to answer    Active Member of Clubs or Organizations: Patient unable to answer    Attends Banker Meetings: Patient unable to answer    Marital Status: Patient unable to answer  Intimate Partner Violence: Patient Unable To Answer (07/15/2024)   Humiliation, Afraid, Rape, and Kick questionnaire    Fear of Current or Ex-Partner: Patient unable to answer    Emotionally Abused: Patient unable to answer    Physically Abused: Patient unable to answer    Sexually Abused: Patient unable to answer    Review of Systems: ROS   There were no vitals filed for this visit.  Physical Exam: Physical Exam   Assessment &  Plan:   Patient {GC/GE:[id number]::discussed with,seen with} Dr. {WJFZD:[id number]::Tpoopjfd,Z. Hoffman,Mullen,Narendra,Vincent,Guilloud,Lau,Machen,Winfrey}  Assessment & Plan    No orders of the defined types were placed in this encounter.    Sallyanne Primas, D.O. [facility] Internal Medicine, PGY-1 Date 11/02/2024 Time 8:17 AM

## 2024-11-03 ENCOUNTER — Other Ambulatory Visit (HOSPITAL_COMMUNITY): Payer: Self-pay

## 2024-11-03 ENCOUNTER — Other Ambulatory Visit: Payer: Self-pay

## 2024-11-03 MED ORDER — OXYCODONE-ACETAMINOPHEN 7.5-325 MG PO TABS
1.0000 | ORAL_TABLET | Freq: Three times a day (TID) | ORAL | 0 refills | Status: DC | PRN
  Filled 2024-11-03: qty 90, 30d supply, fill #0

## 2024-11-08 ENCOUNTER — Telehealth: Payer: Self-pay | Admitting: *Deleted

## 2024-11-08 ENCOUNTER — Other Ambulatory Visit (HOSPITAL_COMMUNITY): Payer: Self-pay

## 2024-11-08 ENCOUNTER — Other Ambulatory Visit: Payer: Self-pay

## 2024-11-08 NOTE — Telephone Encounter (Signed)
 Patient was identified as falling into the [place] Measure - Diabetes.   Patient was: Requires a call back at a later time.

## 2024-11-09 ENCOUNTER — Other Ambulatory Visit: Payer: Self-pay | Admitting: Internal Medicine

## 2024-11-09 ENCOUNTER — Other Ambulatory Visit: Payer: Self-pay

## 2024-11-09 MED ORDER — ASPIRIN 81 MG PO TBEC
81.0000 mg | DELAYED_RELEASE_TABLET | Freq: Every day | ORAL | 2 refills | Status: AC
  Filled 2024-11-09: qty 30, 30d supply, fill #0

## 2024-11-10 ENCOUNTER — Other Ambulatory Visit

## 2024-11-10 ENCOUNTER — Other Ambulatory Visit: Payer: Self-pay

## 2024-11-10 ENCOUNTER — Telehealth: Payer: Self-pay

## 2024-11-10 NOTE — Telephone Encounter (Signed)
 Attempted to contact patient for scheduled appointment for medication management. Left HIPAA compliant message for patient to return my call at their convenience.   Lorain Baseman, PharmD [facility] [phone]

## 2024-11-10 NOTE — Progress Notes (Deleted)
 11/10/2024 Name: Jennifer [name] MRN: [id number] DOB: [birth year]  No chief complaint on file.   Jennifer [name] is a 62 y.o. year old female who presented for a telephone visit.   They were referred to the pharmacist by a quality report for assistance in managing diabetes. PMH includes PAD s/p stenting in 2005 and 2008 and R BKA (2024), HFpEF (EF 65-75% with mild LVH), CVA (July 2025), COPD, GERD, T2DM with gastroparesis, HLD, retinopathy, glaucoma, depression, tobacco use, BMI > 30, mild cognitive impairment.  Patient was identified as falling into the [place] Measure - Diabetes.   Patient was: Referred to pharmacy for chronic disease management.    Subjective: Patient was seen by PCP, Mliss Pouch, MD, on 02/18/24. At last visit, diabetes continued to be poorly controlled, but unable to increase her GLP-1 or metformin  dose due to s/sx of gastroparesis. Patient also has chronic perineal candidiasis, so is not a candidate for SGLT2i. She also reported having poor living conditions, which exacerbates management of her chronic conditions. Since then, she has had multiple ED admissions. In April 2025, she was admitted for osteomyelitis eventually requiring R BKA. She was admitted again in July 2024 with confusion and AMS and found to have acute posterior L MCA infarct. She was initiated on aspirin  and clopidogrel  (3 weeks of DAPT). A1C found to be 9.8%. PTA she was taking basal insulin  65 units daily with metformin  and Ozempic . At discharge she was instructed to decrease insulin  degludec to 25 units daily. Per inpatient DM note, patient reported drinking regular sodas daily and not making healthy food choices. She lives with her son, but he does not assist her with her medications. She has not been able to attend her follow-up appointments. She was engaged by pharmacy via telephone on 09/05/24. Discussed dietary interventions to help lower blood sugars. Continued current regimen (patient had  self-resume higher dose of Tresiba  65 units nightly), but encouraged use of Dexcom with receiver, which I requested to be mailed out from the pharmacy. She was seen by Dr. Pouch on 09/22/24 - said she was using Dexcom but forgot to bring receiver. Displayed significant difficulty with ADLs. BP was elevated to 146/67 mmHg, but she had not taken her medications that morning. At pharmacy call on 10/13/24, patient reported she was wearing Dexcom but was unable to review values over the phone. We scheduled her for PCP and CDCES follow-up on 11/02/24, but she did not show.  Today, ***  Today, patient reports doing ok. She says she is wearing a Dexcom and has receiver nearby, but she is not able to get to it to review recent report while we were on the phone.   Care Team: Primary Care Provider: Pouch Mliss Dragon, MD ; Next Scheduled Visit: needs to be scheduled early-mid Novemeber Cardiologist: Rollo Louder, PA; Next Scheduled Visit: 09/07/24  Medication Access/Adherence  Current Pharmacy:  [facility] [address] [place] [address] Phone: [phone] Fax: [phone]  [facility] [address] [place] [address] Phone: [phone] Fax: [phone]   Patient reports affordability concerns with their medications: No  - [employer] Dual Complete - $0 copays  Patient reports access/transportation concerns to their pharmacy: No  - Using [facility] mail order (compliance packing)  Patient reports adherence concerns with their medications:  Yes  - Denies missed doses of oral medications, insulin , or Ozempic  - however there is discrepancy in her insulin  fill hx -  Tresiba  U200 filled 06/27/24 for 27ds, 09/06/24 for 28ds  Diabetes:  Current medications: Tresiba  65 units daily, metformin  XR 500 mg daily, Ozempic  1 mg weekly (Mondays) Previously tried: higher doses of metformin  were questionably tolerated (difficult to assess with  possible gastroparesis)  Denies GI AE with Ozempic , metformin .  Current glucose readings: States she is now wearing her Dexcom. Received new receiver and has been using it, but she is not able to get to it to review recent numbers while we are on the phone. States BG have typically been running in the 200s Denies low sugars less than 70 mg/dL Previously stated she has had more difficulty with technology since her stroke. In the past was connected in [place] with her Android phone - should revisit cell phone connection if possible for remote monitoring  Patient denies hypoglycemic s/sx including dizziness, shakiness, sweating. Patient denies hyperglycemic symptoms including polyuria, polydipsia, polyphagia, nocturia, neuropathy, blurred vision.  Current meal patterns: 2 meals/day - Snacks - does not eat a lot of sweets or candy. - Drinks - Drinking regular soda (caffeine free orange soda) every day - her son purchases this from [employer] for her. No change in soda intake reported today. States she tried psychologist, counselling free options and did not like it.   Hypertension:  Current medications: amlodipine  10 mg daily, spironolactone  50 mg daily Medications previously tried: metoprolol  succinate (d/c at discharge July 2025)  Patient has an automated, upper arm home BP cuff - not checking. Not sure if it works.  Patient denies hypotensive s/sx including dizziness, lightheadedness. Patient denies hypertensive symptoms including headache, chest pain, shortness of breath  Current physical activity: limited s/p stroke, also bilateral BKA. Reports she can get into her wheelchair from the bed, but cannot get back into bed. After last visit with Dr. Trudy, working on getting more DME to help with ADLs.   Hyperlipidemia/ASCVD Risk Reduction  Current lipid lowering medications: rosuvastatin  20 mg daily  Antiplatelet regimen: aspirin  81 mg daily  ASCVD History: CVA in 2025  Objective:  BP  Readings from Last 3 Encounters:  09/22/24 (!) 146/67  07/20/24 (!) 161/84  05/19/24 (!) 145/82    Lab Results  Component Value Date   HGBA1C 8.8 (H) 07/18/2024   HGBA1C 9.8 (H) 04/06/2024   HGBA1C 9.2 (A) 02/18/2024       Latest Ref Rng & Units 07/20/2024   12:46 PM 07/19/2024    5:02 AM 07/18/2024    6:06 AM  BMP  Glucose 70 - 99 mg/dL 697  768  811   BUN 8 - 23 mg/dL 14  9  <5   Creatinine 0.44 - 1.00 mg/dL 9.12  9.12  9.12   Sodium 135 - 145 mmol/L 132  137  137   Potassium 3.5 - 5.1 mmol/L 4.8  4.9  4.0   Chloride 98 - 111 mmol/L 101  104  104   CO2 22 - 32 mmol/L 22  23  22    Calcium  8.9 - 10.3 mg/dL 9.2  9.1  8.9     Lab Results  Component Value Date   CHOL 85 07/18/2024   HDL 36 (L) 07/18/2024   LDLCALC 36 07/18/2024   TRIG 64 07/18/2024   CHOLHDL 2.4 07/18/2024    Medications Reviewed Today   Medications were not reviewed in this encounter       Assessment/Plan:   Diabetes: - Currently uncontrolled with most recent A1C of 8.8% above goal <7%, but improved from 9.8%. Patient continues  to tolerate GLP-1RA despite dx of gastroparesis. She is not having s/sx of hypoglycemia with current dose of basal insulin . Denies missed doses of insulin , but fill hx indicates possible missed doses. Will collaborate with pharmacy to request fill today. - Last UACR April 2024 - 47 mg/g. She is not a candidate for SGLT2i given chronic yeast infections. - Reviewed long term cardiovascular and renal outcomes of uncontrolled blood sugar - Reviewed goal A1c, goal fasting, and goal 2 hour post prandial glucose - Reviewed hypoglycemia management plan and the rule of 15 - Reviewed dietary modifications including  utilizing the healthy plate method, limiting portion size of carbohydrate foods, increasing intake of protein and non-starchy vegetables. Counseled patient to stay hydrated with water  throughout the day. Again discussed that it will be very difficult to control her BG while  she is drinking regular soda, encouraged trying alternative non-sugar options. - Recommend to continue Tresiba  U200 65 units daily, Ozempic  1 mg daily, metformin  XR 500 mg daily  - Collaborated with pharmacy to refill Tresiba  today and put on automatic refill - Could consider trial of metformin  XR 500 mg BID at follow-up if A1C is worsened, though would need to monitor closely for increased GI symptoms - Recommend to check glucose continuously with Dexcom G7. Assisted in setting up follow-up with [facility] for receiver download per patient preference. - Next A1C due now    Hypertension: - Currently uncontrolled with clinic BP consistently above goal less than 130/80, in the setting of forgetting medications. It is difficult for her to monitor at home. Patient is not having s/sx of hypo- or hyper-tension. She has a hx of swelling with benazepril  so cannot take ACEi/ARB due to c/f angioedema. - Recommend to continue amlodipine  10 mg daily, spironolactone  50 mg daily    Hyperlipidemia/ASCVD Risk Reduction: - Currently controlled with most recent LDL-C of 36 mg/dL below goal < 55 mg/dL given premature ASCVD (PAD, CVA). High intensity statin indicated. - Recommend to continue rosuvastatin  20 mg daily, aspirin  81 mg daily    Patient verbalized understanding of treatment plan.    Follow Up Plan:  Pharmacist telephone 11/10/24 CDCES 11/02/24 PCP clinic visit needs to be scheduled once open for Dec appts   Lorain Baseman, PharmD [facility] [phone]

## 2024-11-14 ENCOUNTER — Other Ambulatory Visit: Payer: Self-pay

## 2024-11-15 ENCOUNTER — Other Ambulatory Visit: Payer: Self-pay

## 2024-11-16 ENCOUNTER — Encounter: Payer: Self-pay | Admitting: *Deleted

## 2024-11-16 NOTE — Progress Notes (Signed)
 Jennifer [name]                                          MRN: [id number]   11/16/2024   The [facility] Quality Team Specialist reviewed this patient medical record for the purposes of chart review for care gap closure. The following were reviewed: chart review for care gap closure-kidney health evaluation for diabetes:eGFR  and uACR.    [facility]BCI Quality Team

## 2024-11-21 ENCOUNTER — Other Ambulatory Visit: Payer: Self-pay

## 2024-11-23 NOTE — Telephone Encounter (Unsigned)
 Copied from CRM #[id number]. Topic: General - Other >> Nov 16, 2024  4:39 PM Debby BROCKS wrote: Reason for CRM: [employer] called following up on documents that were sent to the clinic regarding patients Oxygen  5 year Requalification for the equipment. They stated they faxed over information on 11/07/2024 Callback: [phone] Fax: [phone]

## 2024-11-25 ENCOUNTER — Other Ambulatory Visit: Payer: Self-pay | Admitting: Internal Medicine

## 2024-11-25 DIAGNOSIS — G547 Phantom limb syndrome without pain: Secondary | ICD-10-CM

## 2024-11-25 NOTE — Telephone Encounter (Unsigned)
 Copied from CRM #[id number]. Topic: Clinical - Medication Refill >> Nov 25, 2024  2:09 PM Debby BROCKS wrote: Medication: oxyCODONE -acetaminophen  (PERCOCET) 7.5-325 MG tablet  Has the patient contacted their pharmacy? Yes (Agent: If no, request that the patient contact the pharmacy for the refill. If patient does not wish to contact the pharmacy document the reason why and proceed with request.) (Agent: If yes, when and what did the pharmacy advise?)  This is the patient's preferred pharmacy:   - [facility] [address] [place] [address] Phone: [phone] Fax: [phone]  Is this the correct pharmacy for this prescription? Yes If no, delete pharmacy and type the correct one.   Has the prescription been filled recently? No  Is the patient out of the medication? Yes  Has the patient been seen for an appointment in the last year OR does the patient have an upcoming appointment? Yes  Can we respond through MyChart? Yes  Agent: Please be advised that Rx refills may take up to 3 business days. We ask that you follow-up with your pharmacy.

## 2024-11-29 MED ORDER — OXYCODONE-ACETAMINOPHEN 7.5-325 MG PO TABS
1.0000 | ORAL_TABLET | Freq: Three times a day (TID) | ORAL | 0 refills | Status: DC | PRN
  Filled 2024-11-29 – 2024-12-01 (×2): qty 90, 30d supply, fill #0

## 2024-11-29 NOTE — Telephone Encounter (Signed)
 Last rx written - 11/03/24. Last OV - 09/22/24. TOX - 07/14/24.

## 2024-11-30 ENCOUNTER — Other Ambulatory Visit (HOSPITAL_COMMUNITY): Payer: Self-pay

## 2024-11-30 ENCOUNTER — Other Ambulatory Visit: Payer: Self-pay | Admitting: *Deleted

## 2024-11-30 ENCOUNTER — Other Ambulatory Visit: Payer: Self-pay

## 2024-11-30 ENCOUNTER — Telehealth: Payer: Self-pay | Admitting: *Deleted

## 2024-11-30 NOTE — Telephone Encounter (Addendum)
 I called pt who stated she will be getting her other prosthetic leg and will need to learn how to walk with 2 legs again. She's requesting a PT referral order. Thanks

## 2024-11-30 NOTE — Telephone Encounter (Signed)
 Copied from CRM #[id number]. Topic: Referral - Request for Referral >> Nov 30, 2024  2:11 PM Chiquita SQUIBB wrote: Did the patient discuss referral with their provider in the last year? Yes (If No - schedule appointment) (If Yes - send message)  Appointment offered? No  Type of order/referral and detailed reason for visit: Physical Therapy for after prosthesis on 12/23  Preference of office, provider, location: at [address]   If referral order, have you been seen by this specialty before? Yes (If Yes, this issue or another issue? When? Where?  Can we respond through MyChart? Yes

## 2024-12-01 ENCOUNTER — Other Ambulatory Visit: Payer: Self-pay

## 2024-12-01 ENCOUNTER — Other Ambulatory Visit (HOSPITAL_COMMUNITY): Payer: Self-pay

## 2024-12-01 ENCOUNTER — Other Ambulatory Visit: Payer: Self-pay | Admitting: Internal Medicine

## 2024-12-01 DIAGNOSIS — Z89511 Acquired absence of right leg below knee: Secondary | ICD-10-CM

## 2024-12-01 NOTE — Telephone Encounter (Signed)
 She wants outpatient PT.

## 2024-12-02 ENCOUNTER — Other Ambulatory Visit (HOSPITAL_COMMUNITY): Payer: Self-pay

## 2024-12-08 ENCOUNTER — Other Ambulatory Visit: Payer: Self-pay

## 2024-12-08 ENCOUNTER — Other Ambulatory Visit

## 2024-12-08 DIAGNOSIS — Z7984 Long term (current) use of oral hypoglycemic drugs: Secondary | ICD-10-CM

## 2024-12-08 DIAGNOSIS — Z7985 Long-term (current) use of injectable non-insulin antidiabetic drugs: Secondary | ICD-10-CM

## 2024-12-08 DIAGNOSIS — Z794 Long term (current) use of insulin: Secondary | ICD-10-CM

## 2024-12-08 DIAGNOSIS — E119 Type 2 diabetes mellitus without complications: Secondary | ICD-10-CM

## 2024-12-08 DIAGNOSIS — I10 Essential (primary) hypertension: Secondary | ICD-10-CM

## 2024-12-08 DIAGNOSIS — E1142 Type 2 diabetes mellitus with diabetic polyneuropathy: Secondary | ICD-10-CM

## 2024-12-08 DIAGNOSIS — E785 Hyperlipidemia, unspecified: Secondary | ICD-10-CM

## 2024-12-08 NOTE — Progress Notes (Signed)
 12/08/2024 Name: Jennifer [name] MRN: [id number] DOB: Jun 22, 1961  Chief Complaint  Patient presents with   Diabetes    Jennifer [name] is a 63 y.o. year old female who presented for a telephone visit.   They were referred to the pharmacist by a quality report for assistance in managing diabetes. PMH includes PAD s/p stenting in 2005 and 2008 and R BKA (2024), HFpEF (EF 65-75% with mild LVH), CVA (July 2025), COPD, GERD, T2DM with gastroparesis, HLD, retinopathy, glaucoma, depression, tobacco use, BMI > 30, mild cognitive impairment.  Patient was identified as falling into the [place] Measure - Diabetes.   Patient was: Referred to pharmacy for chronic disease management.    Subjective: Patient was seen by PCP, Jennifer Pouch, MD, on 02/18/24. At last visit, diabetes continued to be poorly controlled, but unable to increase her GLP-1 or metformin  dose due to s/sx of gastroparesis. Patient also has chronic perineal candidiasis, so is not a candidate for SGLT2i. She also reported having poor living conditions, which exacerbates management of her chronic conditions. Since then, she has had multiple ED admissions. In April 2025, she was admitted for osteomyelitis eventually requiring R BKA. She was admitted again in July 2024 with confusion and AMS and found to have acute posterior L MCA infarct. She was initiated on aspirin  and clopidogrel  (3 weeks of DAPT). A1C found to be 9.8%. PTA she was taking basal insulin  65 units daily with metformin  and Ozempic . At discharge she was instructed to decrease insulin  degludec to 25 units daily. Per inpatient DM note, patient reported drinking regular sodas daily and not making healthy food choices. She lives with her son, but he does not assist her with her medications. She has not been able to attend her follow-up appointments. She was engaged by pharmacy via telephone on 09/05/24. Discussed dietary interventions to help lower blood sugars. Continued current  regimen (patient had self-resume higher dose of Tresiba  65 units nightly), but encouraged use of Dexcom with receiver, which I requested to be mailed out from the pharmacy. She was seen by Dr. Pouch on 09/22/24 - said she was using Dexcom but forgot to bring receiver. Displayed significant difficulty with ADLs. BP was elevated to 146/67 mmHg, but she had not taken her medications that morning. At pharmacy call on 10/13/24, patient reported she was wearing Dexcom but was unable to review values over the phone. We scheduled her for PCP and CDCES follow-up on 11/02/24, but she did not show.  Today, patient reports doing ok. States she does not have much time to talk. She has not had a Dexcom on in about 1 week, but is planning to reapply soon. Willing to schedule PCP follow-up in February (not earlier). States she has current supplies of all her medications (consistent with fill hx).  Care Team: Primary Care Provider: Pouch Jennifer Dragon, MD ; Next Scheduled Visit: needs to be scheduled Cardiologist: Jennifer Louder, PA; Next Scheduled Visit: 09/07/24  Medication Access/Adherence  Current Pharmacy:  [name] [address] [place] [address] Phone: [phone] Fax: [phone]  [name] [address] [place] [address] Phone: [phone] Fax: [phone]   Patient reports affordability concerns with their medications: No  - [employer] Dual Complete - $0 copays  Patient reports access/transportation concerns to their pharmacy: No  - Using [facility] mail order (compliance packing)  Patient reports adherence concerns with their medications:  Yes  - Reports she missed  two doses of Tresiba  this past week.  Diabetes:  Current medications: Tresiba  U200 65 units daily (reports she is taking 69 units daily), metformin  XR 500 mg daily, Ozempic  1 mg weekly (Mondays) Previously tried: higher doses of metformin  were questionably  tolerated (difficult to assess with possible gastroparesis)  Denies GI AE with Ozempic , metformin .  Current glucose readings: States has Dexcom and receiver, but has not used a sensor in ~1 week. Has replacement she can re-apply. Previously stated she has had more difficulty with technology since her stroke. In the past was connected in [employer] with her Android phone - should revisit cell phone connection if possible in the future for remote monitoring  Patient denies hypoglycemic s/sx including dizziness, shakiness, sweating. Patient denies hyperglycemic symptoms including polyuria, polydipsia, polyphagia, nocturia, neuropathy, blurred vision.  Current meal patterns: 2 meals/day - Snacks - does not eat a lot of sweets or candy. - Drinks - Drinking regular soda (caffeine free orange soda) every day - her son purchases this from [employer] for her. No change in soda intake reported today. States she tried psychologist, counselling free options and did not like it.   Hypertension:  Current medications: amlodipine  10 mg daily, spironolactone  50 mg daily Medications previously tried: metoprolol  succinate (d/c at discharge July 2025)  Hyperlipidemia/ASCVD Risk Reduction  Current lipid lowering medications: rosuvastatin  20 mg daily  Antiplatelet regimen: aspirin  81 mg daily  ASCVD History: CVA in 2025  Objective:  BP Readings from Last 3 Encounters:  09/22/24 (!) 146/67  07/20/24 (!) 161/84  05/19/24 (!) 145/82    Lab Results  Component Value Date   HGBA1C 8.8 (H) 07/18/2024   HGBA1C 9.8 (H) 04/06/2024   HGBA1C 9.2 (A) 02/18/2024       Latest Ref Rng & Units 07/20/2024   12:46 PM 07/19/2024    5:02 AM 07/18/2024    6:06 AM  BMP  Glucose 70 - 99 mg/dL 697  768  811   BUN 8 - 23 mg/dL 14  9  <5   Creatinine 0.44 - 1.00 mg/dL 9.12  9.12  9.12   Sodium 135 - 145 mmol/L 132  137  137   Potassium 3.5 - 5.1 mmol/L 4.8  4.9  4.0   Chloride 98 - 111 mmol/L 101  104  104   CO2 22 - 32 mmol/L 22   23  22    Calcium  8.9 - 10.3 mg/dL 9.2  9.1  8.9     Lab Results  Component Value Date   CHOL 85 07/18/2024   HDL 36 (L) 07/18/2024   LDLCALC 36 07/18/2024   TRIG 64 07/18/2024   CHOLHDL 2.4 07/18/2024    Medications Reviewed Today     Reviewed by Brinda Lorain SQUIBB, RPH-CPP (Pharmacist) on 12/08/24 at 1503  Med List Status: <None>   Medication Order Taking? Sig Documenting Provider Last Dose Status Informant  albuterol  (PROAIR  HFA) 108 (90 Base) MCG/ACT inhaler [id number]  Inhale 2 puffs into the lungs every 6 (six) hours as needed for wheezing. Trudy Jennifer Dragon, MD  Active Pharmacy Records, Family Member, Multiple Informants  amLODipine  (NORVASC ) 10 MG tablet [id number]  Take 1 tablet (10 mg total) by mouth daily. Trudy Jennifer Dragon, MD  Active   aspirin  EC (ASPIRIN  LOW DOSE) 81 MG tablet [id number]  Take 1 tablet (81 mg total) by mouth daily. Swallow whole. Trudy Jennifer Dragon, MD  Active   Continuous Glucose Receiver [facility] G7 RECEIVER) [place] [id number]  Use as directed to monitor blood  glucose using Dexcom G7 sensors. Trudy Jennifer Dragon, MD  Active   Continuous Glucose Sensor [facility] G7 SENSOR) [place] [id number]  Place new sensor every 10 days. Use to monitor blood sugar continuously. Trudy Jennifer Dragon, MD  Active   DULoxetine  (CYMBALTA ) 60 MG capsule [id number]  Take 1 capsule (60 mg total) by mouth daily. Trudy Jennifer Dragon, MD  Active Pharmacy Records, Family Member, Multiple Informants  fluticasone  (FLONASE ) 50 MCG/ACT nasal spray [id number]  Place 1 spray into both nostrils 2 (two) times daily for allergies.   Active Pharmacy Records, Family Member, Multiple Informants  insulin  degludec (TRESIBA  FLEXTOUCH) 200 UNIT/ML FlexTouch Pen [id number] Yes Inject 64 Units into the skin daily. Trudy Jennifer Dragon, MD  Active   Insulin  Pen Needle 32G X 4 MM MISC [id number]  Use to inject insulin  4 (four) times daily. Trudy Jennifer Dragon, MD  Active Pharmacy Records, Family Member, Multiple  Informants  lidocaine  (LIDODERM ) 5 % [id number]  Place 1 patch onto the skin daily. Remove & Discard patch within 12 hours or as directed by MD Doretha Folks, MD  Active Pharmacy Records, Family Member, Multiple Informants  metFORMIN  (GLUCOPHAGE -XR) 500 MG 24 hr tablet [id number] Yes Take 1 tablet (500 mg total) by mouth daily with breakfast. Trudy Jennifer Dragon, MD  Active   MYRBETRIQ  50 MG TB24 tablet [id number]  Take 1 tablet (50 mg total) by mouth daily. Trudy Jennifer Dragon, MD  Active   ondansetron  (ZOFRAN ) 4 MG tablet [id number]  Take 1 tablet (4 mg total) by mouth every 8 (eight) hours as needed for nausea and vomiting.   Active Pharmacy Records, Family Member, Multiple Informants  oxyCODONE -acetaminophen  (PERCOCET) 7.5-325 MG tablet [id number]  Take 1 tablet by mouth 3 (three) times daily as needed for severe pain (pain score 7-10). Trudy Jennifer Dragon, MD  Active   pantoprazole  (PROTONIX ) 40 MG tablet [id number]  Take 1 tablet (40 mg total) by mouth daily for GERD. Trudy Jennifer Dragon, MD  Active     Discontinued 01/23/14 1328 (Reorder)   pregabalin  (LYRICA ) 75 MG capsule [id number]  Take 1 capsule (75 mg total) by mouth 3 (three) times daily. Trudy Jennifer Dragon, MD  Active   rosuvastatin  (CRESTOR ) 20 MG tablet [id number]  Take 1 tablet (20 mg total) by mouth in the morning. Trudy Jennifer Dragon, MD  Active Pharmacy Records, Family Member, Multiple Informants  Semaglutide , 1 MG/DOSE, (OZEMPIC , 1 MG/DOSE,) 4 MG/3ML SOPN [id number] Yes Inject 1 mg into the skin once a week. Trudy Jennifer Dragon, MD  Active Pharmacy Records, Family Member, Multiple Informants  spironolactone  (ALDACTONE ) 50 MG tablet [id number]  Take 1 tablet (50 mg total) by mouth daily. Trudy Jennifer Dragon, MD  Active   umeclidinium-vilanterol (ANORO ELLIPTA ) 62.5-25 MCG/ACT AEPB [id number]  Inhale 1 puff into the lungs in the morning. Trudy Jennifer Dragon, MD  Active Pharmacy Records, Family Member, Multiple Informants               Assessment/Plan:   Diabetes: - Currently uncontrolled with most recent A1C of 8.8% above goal <7%, but improved from 9.8%. Patient continues to tolerate GLP-1RA despite dx of gastroparesis. She is not having s/sx of hypoglycemia with current dose of basal insulin . Encouraged continued use of CGM and to bring receiver to follow-up appt. - Last UACR April 2024 - 47 mg/g. She is not a candidate for SGLT2i given chronic yeast infections. - Reviewed [name] term cardiovascular and renal outcomes of uncontrolled blood sugar - Reviewed goal A1c, goal fasting, and goal 2  hour post prandial glucose - Reviewed hypoglycemia management plan and the rule of 15 - Reviewed dietary modifications including  utilizing the healthy plate method, limiting portion size of carbohydrate foods, increasing intake of protein and non-starchy vegetables.  - Recommend to continue Tresiba  U200 69 units daily, Ozempic  1 mg daily, metformin  XR 500 mg daily  - Could consider trial of metformin  XR 500 mg BID at follow-up if A1C is worsened, though would need to monitor closely for increased GI symptoms - Recommend to check glucose continuously with Dexcom G7 - Next A1C due now    Hypertension: - Currently uncontrolled with clinic BP consistently above goal less than 130/80 It is difficult for her to monitor at home.  - Recommend to continue amlodipine  10 mg daily, spironolactone  50 mg daily    Hyperlipidemia/ASCVD Risk Reduction: - Currently controlled with most recent LDL-C of 36 mg/dL below goal < 55 mg/dL given premature ASCVD (PAD, CVA). High intensity statin indicated. - Recommend to continue rosuvastatin  20 mg daily, aspirin  81 mg daily    Patient verbalized understanding of treatment plan.    Follow Up Plan:  Pharmacist telephone 01/30/25 PCP 01/24/25 per patient preference - scheduled today   Lorain Baseman, PharmD [facility] [phone]

## 2024-12-13 ENCOUNTER — Telehealth: Payer: Self-pay | Admitting: *Deleted

## 2024-12-13 NOTE — Telephone Encounter (Signed)
 Return call to [employer] - I let them know the person who does CPAP's will be back on Monday.

## 2024-12-13 NOTE — Telephone Encounter (Signed)
 Copied from CRM [id number]. Topic: Referral - Prior Authorization Question >> Dec 13, 2024  1:52 PM Farrel B wrote: Reason for CRM: [employer] for cpap please call Ms. [phone] request informing on the requirements for the pt to receive the necessary supplies, she stated that the request was sent for pt to possibly have to repeat the sleep study but they have not heard anything from the provider. Please call and advise Ms. Dianne

## 2024-12-23 ENCOUNTER — Other Ambulatory Visit: Payer: Self-pay

## 2024-12-26 ENCOUNTER — Other Ambulatory Visit (HOSPITAL_COMMUNITY): Payer: Self-pay

## 2024-12-27 ENCOUNTER — Encounter: Payer: Self-pay | Admitting: Internal Medicine

## 2025-01-03 ENCOUNTER — Ambulatory Visit: Attending: Internal Medicine

## 2025-01-04 ENCOUNTER — Ambulatory Visit: Payer: Self-pay | Admitting: Internal Medicine

## 2025-01-04 DIAGNOSIS — G547 Phantom limb syndrome without pain: Secondary | ICD-10-CM

## 2025-01-04 NOTE — Telephone Encounter (Signed)
 FYI Only or Action Required?: Action required by provider: medication refill request and out of Lyrica  and Oxycodone , severe pain.  Also scheduled appt due to skin spots.  Patient was last seen in primary care on 09/22/2024 by Trudy Mliss Dragon, MD.  Called Nurse Triage reporting Pain.  Symptoms began recent upon running out of medication.  Interventions attempted: Nothing.  Symptoms are: gradually worsening.  Triage Disposition: See PCP Within 2 Weeks  Patient/caregiver understands and will follow disposition?: Yes    Called CAL @ 1516, will further address.     Copied from CRM [id number]. Topic: Clinical - Red Word Triage >> Jan 04, 2025  2:45 PM Graeme ORN wrote: Red Word that prompted transfer to Nurse Triage: Hands and Knees in a lot of pain - double amputee. Out of medication since Friday.    ----------------------------------------------------------------------- From previous Reason for Contact - Medication Refill: Medication:   Has the patient contacted their pharmacy?   (Agent: If no, request that the patient contact the pharmacy for the refill. If patient does not wish to contact the pharmacy document the reason why and proceed with request.) (Agent: If yes, when and what did the pharmacy advise?)  This is the patient's preferred pharmacy:  [facility] [address] [place] [address] Phone: [phone] Fax: [phone]  [facility] [address] [place] [address] Phone: [phone] Fax: [phone]  Is this the correct pharmacy for this prescription?   If no, delete pharmacy and type the correct one.   Has the prescription been filled recently?    Is the patient out of the medication?    Has the patient been seen for an appointment in the last year OR does the patient have an upcoming appointment?    Can we respond through MyChart?    Agent: Please be advised that  Rx refills may take up to 3 business days. We ask that you follow-up with your pharmacy.      Reason for Disposition  Caller is uncertain what lesion is    Skin spots  Additional Information  Negative: [1] SEVERE pain (e.g., excruciating, unable to do any normal activities) AND [2] not improved 2 hours after pain medicine    Out of medication, reports does not feel need to seek emergency care.  Calling to get medications refilled.  Negative: Diabetes mellitus or weak immune system (e.g., HIV positive, cancer chemo, splenectomy, organ transplant, chronic steroids)    Deferred, pain related to out of medication.  Commented on: Body pains are a chronic symptom (recurrent or ongoing AND present > 4 weeks)    Will notify PCP of need for medication refills.  Answer Assessment - Initial Assessment Questions Out of Lyrica  200 mg, is first time calling. Also out of Oxycodone .   Requests appointment scheduled with Dr. Trudy in April as well for routine visit as has not seen her recently, wants to plan ahead and also have transportation ready.   1. ONSET: When did the muscle aches or body pains start?      Upon running out of medication  2. LOCATION: What part of your body is hurting? (e.g., entire body, arms, legs)      Bilateral stumps (amputee), hands  3. SEVERITY: How bad is the pain? (Scale 1-10; or mild, moderate, severe)     10/10  4. CAUSE: What do you think is causing the pains?     Out of medication  5. FEVER: Do you have a fever? If Yes, ask: What is your temperature, how was it measured, and  when did it start?      Denies fever/ chills or recent illness  6. OTHER SYMPTOMS: Do you have any other symptoms? (e.g., chest pain, cold or flu symptoms, rash, weakness, weight loss)     Denies.   Does report new brown skin spots to hands- see alternative assessment.  Answer Assessment - Initial Assessment Questions APPEARANCE of LESION: What does it look like?       Skin spots  SIZE: How big is it? (e.g., inches, cm; or compare to size of pinhead, tip of pen, eraser, coin, pea, grape, ping pong ball)      Different shapes and sizes, come and go both hands  COLOR: What color is it? Is there more than one color?     Brown, intact  TENDER: Does it hurt when you touch it?  (Scale 1-10; or mild, moderate, severe)     Denies any open sores, denies any signs of infection, drainage nor bleeding.  Denies any recent injury or bumping into anything.   LOCATION: Where is it located?      Bilateral hands  ONSET: When did it first appear?      Recent  NUMBER: Is there just one? or Are there others?     Multiple  CAUSE: What do you think it is?       Unknown  OTHER SYMPTOMS: Do you have any other symptoms? (e.g., fever)       Denies  Protocols used: Muscle Aches and Body Pain-A-AH, Skin Lesion - Moles or Growths-A-AH

## 2025-01-04 NOTE — Telephone Encounter (Signed)
 I called and spoke to the patient regarding spots on her skin. I offered the patient a virtual visit for tomorrow but the patient stated she does not know how to use mychart. Patient also stated she needs a refill on her lyrica  and oxycodone .

## 2025-01-05 ENCOUNTER — Ambulatory Visit: Payer: Self-pay

## 2025-01-05 ENCOUNTER — Telehealth: Payer: Self-pay | Admitting: *Deleted

## 2025-01-05 ENCOUNTER — Other Ambulatory Visit: Payer: Self-pay

## 2025-01-05 DIAGNOSIS — E119 Type 2 diabetes mellitus without complications: Secondary | ICD-10-CM

## 2025-01-05 MED ORDER — TRESIBA FLEXTOUCH 200 UNIT/ML ~~LOC~~ SOPN
64.0000 [IU] | PEN_INJECTOR | Freq: Every day | SUBCUTANEOUS | 3 refills | Status: AC
  Filled 2025-01-05: qty 9, 28d supply, fill #0

## 2025-01-05 NOTE — Telephone Encounter (Signed)
 Copied from CRM #[id number]. Topic: Clinical - Medication Question >> Jan 05, 2025  2:44 PM DeAngela L wrote: Reason for CRM: patient calling about her NT call yesterday and a medication refill from yesterday to check the status of refill for  oxyCODONE -acetaminophen  (PERCOCET) 7.5-325 MG tablet pregabalin  (LYRICA ) 200 mg    Delivery services with pharmacy  [place] [facility] [address] [place] [address] Phone: [phone] Fax: [phone]  Patient num Mobile [phone] Primary

## 2025-01-06 ENCOUNTER — Other Ambulatory Visit: Payer: Self-pay | Admitting: Internal Medicine

## 2025-01-06 ENCOUNTER — Other Ambulatory Visit: Payer: Self-pay

## 2025-01-06 ENCOUNTER — Other Ambulatory Visit (HOSPITAL_COMMUNITY): Payer: Self-pay

## 2025-01-06 DIAGNOSIS — G547 Phantom limb syndrome without pain: Secondary | ICD-10-CM

## 2025-01-06 MED ORDER — PREGABALIN 75 MG PO CAPS
75.0000 mg | ORAL_CAPSULE | Freq: Three times a day (TID) | ORAL | 3 refills | Status: AC
  Filled 2025-01-06: qty 270, 90d supply, fill #0

## 2025-01-06 MED ORDER — OXYCODONE-ACETAMINOPHEN 7.5-325 MG PO TABS
1.0000 | ORAL_TABLET | Freq: Three times a day (TID) | ORAL | 0 refills | Status: AC | PRN
  Filled 2025-01-06 – 2025-01-09 (×3): qty 90, 30d supply, fill #0

## 2025-01-06 NOTE — Telephone Encounter (Signed)
 Patient called she is requesting her medications to be refilled today.

## 2025-01-09 ENCOUNTER — Other Ambulatory Visit (HOSPITAL_COMMUNITY): Payer: Self-pay

## 2025-01-09 ENCOUNTER — Ambulatory Visit (INDEPENDENT_AMBULATORY_CARE_PROVIDER_SITE_OTHER): Payer: Self-pay

## 2025-01-09 ENCOUNTER — Telehealth: Payer: Self-pay | Admitting: *Deleted

## 2025-01-09 ENCOUNTER — Other Ambulatory Visit: Payer: Self-pay

## 2025-01-09 ENCOUNTER — Encounter: Payer: Self-pay | Admitting: Pharmacist

## 2025-01-09 VITALS — BP 132/74 | HR 91 | Temp 98.2°F | Ht 65.0 in

## 2025-01-09 DIAGNOSIS — L851 Acquired keratosis [keratoderma] palmaris et plantaris: Secondary | ICD-10-CM

## 2025-01-09 DIAGNOSIS — Z7984 Long term (current) use of oral hypoglycemic drugs: Secondary | ICD-10-CM

## 2025-01-09 DIAGNOSIS — E119 Type 2 diabetes mellitus without complications: Secondary | ICD-10-CM

## 2025-01-09 DIAGNOSIS — Q828 Other specified congenital malformations of skin: Secondary | ICD-10-CM

## 2025-01-09 DIAGNOSIS — Z23 Encounter for immunization: Secondary | ICD-10-CM

## 2025-01-09 DIAGNOSIS — Z794 Long term (current) use of insulin: Secondary | ICD-10-CM | POA: Diagnosis not present

## 2025-01-09 LAB — POCT GLYCOSYLATED HEMOGLOBIN (HGB A1C): HbA1c, POC (controlled diabetic range): 7.2 % — AB (ref 0.0–7.0)

## 2025-01-09 LAB — GLUCOSE, CAPILLARY: Glucose-Capillary: 84 mg/dL (ref 70–99)

## 2025-01-09 NOTE — Telephone Encounter (Unsigned)
 Copied from CRM #[id number]. Topic: Clinical - Medication Question >> Jan 05, 2025  2:44 PM DeAngela L wrote: Reason for CRM: patient calling about her NT call yesterday and a medication refill from yesterday to check the status of refill for  oxyCODONE -acetaminophen  (PERCOCET) 7.5-325 MG tablet pregabalin  (LYRICA ) 200 mg    Delivery services with pharmacy  [place] [facility] [address] [place] [address] Phone: [phone] Fax: [phone]  Patient num Mobile [phone] Primary >> Jan 06, 2025  1:36 PM Miquel SAILOR wrote: PT calling on update for refill medication. 3rd attempt needs call back ASAP [phone] >> Jan 05, 2025  3:32 PM Miquel SAILOR wrote: PT needs call back ASAP on Medication for refill due to she is a lot of pain. Needs call back [phone]

## 2025-01-09 NOTE — Patient Instructions (Signed)
 It was wonderful seeing you today!   1) For your pain, we're hoping that the pharmacy here will be able to get your medicine to you.   2) We have ordered some labs, please come back and get the labs within the next week.   If you have any questions please feel free to the call the clinic at anytime at [phone].  Have a blessed day,  Dr. Charmayne
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
## 2025-01-11 ENCOUNTER — Other Ambulatory Visit: Payer: Self-pay

## 2025-01-11 NOTE — Progress Notes (Signed)
 [facility] Attending  Case discussed with the resident at the time of the visit.  We reviewed the residents history and exam and pertinent patient test results.  I agree with the assessment, diagnosis, and plan of care documented in the residents note.    I'm not 100% sure what is causing this rash either. I agree with checking basic labs (CBC w diff, CMP) to look for underlying systemic illness, checking RPR & HIV, and getting age appropriate cancer screening

## 2025-01-16 ENCOUNTER — Other Ambulatory Visit: Payer: Self-pay

## 2025-01-17 ENCOUNTER — Other Ambulatory Visit: Payer: Self-pay

## 2025-01-20 ENCOUNTER — Other Ambulatory Visit: Payer: Self-pay

## 2025-01-22 ENCOUNTER — Other Ambulatory Visit: Payer: Self-pay

## 2025-01-24 ENCOUNTER — Other Ambulatory Visit: Payer: Self-pay

## 2025-01-24 ENCOUNTER — Ambulatory Visit: Payer: Self-pay | Admitting: Student

## 2025-01-25 ENCOUNTER — Ambulatory Visit

## 2025-01-25 ENCOUNTER — Inpatient Hospital Stay: Admission: RE | Admit: 2025-01-25 | Source: Ambulatory Visit

## 2025-01-30 ENCOUNTER — Other Ambulatory Visit: Payer: Self-pay

## 2025-03-23 ENCOUNTER — Ambulatory Visit: Payer: Self-pay | Admitting: Internal Medicine
